# Patient Record
Sex: Female | Born: 1965
Health system: Southern US, Community
[De-identification: ages and names within clinical notes are randomized; demographics above are authoritative.]

## PROBLEM LIST (undated history)

## (undated) ENCOUNTER — Emergency Department (HOSPITAL_COMMUNITY): Admission: EM | Payer: Self-pay | Source: Home / Self Care

## (undated) ENCOUNTER — Emergency Department (HOSPITAL_COMMUNITY): Payer: Self-pay | Source: Home / Self Care

## (undated) DIAGNOSIS — R911 Solitary pulmonary nodule: Secondary | ICD-10-CM

## (undated) DIAGNOSIS — I2510 Atherosclerotic heart disease of native coronary artery without angina pectoris: Secondary | ICD-10-CM

## (undated) DIAGNOSIS — Z765 Malingerer [conscious simulation]: Secondary | ICD-10-CM

## (undated) DIAGNOSIS — F32A Depression, unspecified: Secondary | ICD-10-CM

## (undated) DIAGNOSIS — R109 Unspecified abdominal pain: Secondary | ICD-10-CM

## (undated) DIAGNOSIS — R112 Nausea with vomiting, unspecified: Secondary | ICD-10-CM

## (undated) DIAGNOSIS — I639 Cerebral infarction, unspecified: Secondary | ICD-10-CM

## (undated) DIAGNOSIS — K635 Polyp of colon: Secondary | ICD-10-CM

## (undated) DIAGNOSIS — F419 Anxiety disorder, unspecified: Secondary | ICD-10-CM

## (undated) DIAGNOSIS — G43909 Migraine, unspecified, not intractable, without status migrainosus: Secondary | ICD-10-CM

## (undated) DIAGNOSIS — K5790 Diverticulosis of intestine, part unspecified, without perforation or abscess without bleeding: Secondary | ICD-10-CM

## (undated) DIAGNOSIS — K859 Acute pancreatitis without necrosis or infection, unspecified: Secondary | ICD-10-CM

## (undated) DIAGNOSIS — L930 Discoid lupus erythematosus: Secondary | ICD-10-CM

## (undated) DIAGNOSIS — K649 Unspecified hemorrhoids: Secondary | ICD-10-CM

## (undated) DIAGNOSIS — I499 Cardiac arrhythmia, unspecified: Secondary | ICD-10-CM

## (undated) DIAGNOSIS — E278 Other specified disorders of adrenal gland: Secondary | ICD-10-CM

## (undated) DIAGNOSIS — K2970 Gastritis, unspecified, without bleeding: Secondary | ICD-10-CM

## (undated) DIAGNOSIS — L089 Local infection of the skin and subcutaneous tissue, unspecified: Secondary | ICD-10-CM

## (undated) DIAGNOSIS — K449 Diaphragmatic hernia without obstruction or gangrene: Secondary | ICD-10-CM

## (undated) DIAGNOSIS — M329 Systemic lupus erythematosus, unspecified: Secondary | ICD-10-CM

## (undated) DIAGNOSIS — I10 Essential (primary) hypertension: Secondary | ICD-10-CM

## (undated) DIAGNOSIS — Z5189 Encounter for other specified aftercare: Secondary | ICD-10-CM

## (undated) DIAGNOSIS — G8929 Other chronic pain: Secondary | ICD-10-CM

## (undated) DIAGNOSIS — K222 Esophageal obstruction: Secondary | ICD-10-CM

## (undated) DIAGNOSIS — L0291 Cutaneous abscess, unspecified: Secondary | ICD-10-CM

## (undated) DIAGNOSIS — D573 Sickle-cell trait: Secondary | ICD-10-CM

## (undated) DIAGNOSIS — K3184 Gastroparesis: Secondary | ICD-10-CM

## (undated) DIAGNOSIS — Z980 Intestinal bypass and anastomosis status: Secondary | ICD-10-CM

## (undated) DIAGNOSIS — IMO0002 Reserved for concepts with insufficient information to code with codable children: Secondary | ICD-10-CM

## (undated) DIAGNOSIS — F1010 Alcohol abuse, uncomplicated: Secondary | ICD-10-CM

## (undated) DIAGNOSIS — E079 Disorder of thyroid, unspecified: Secondary | ICD-10-CM

## (undated) DIAGNOSIS — S31109A Unspecified open wound of abdominal wall, unspecified quadrant without penetration into peritoneal cavity, initial encounter: Secondary | ICD-10-CM

## (undated) DIAGNOSIS — K219 Gastro-esophageal reflux disease without esophagitis: Secondary | ICD-10-CM

## (undated) DIAGNOSIS — T1491XA Suicide attempt, initial encounter: Secondary | ICD-10-CM

## (undated) DIAGNOSIS — D649 Anemia, unspecified: Secondary | ICD-10-CM

## (undated) DIAGNOSIS — F329 Major depressive disorder, single episode, unspecified: Secondary | ICD-10-CM

## (undated) HISTORY — DX: Cardiac arrhythmia, unspecified: I49.9

## (undated) HISTORY — DX: Gastroparesis: K31.84

## (undated) HISTORY — PX: ABDOMINAL SURGERY: SHX537

## (undated) HISTORY — PX: OTHER SURGICAL HISTORY: SHX169

## (undated) HISTORY — DX: Discoid lupus erythematosus: L93.0

## (undated) HISTORY — DX: Encounter for other specified aftercare: Z51.89

## (undated) HISTORY — PX: ABDOMINAL HYSTERECTOMY: SHX81

## (undated) HISTORY — DX: Migraine, unspecified, not intractable, without status migrainosus: G43.909

## (undated) HISTORY — PX: COLONOSCOPY: SHX174

## (undated) HISTORY — PX: HERNIA REPAIR: SHX51

## (undated) HISTORY — DX: Anemia, unspecified: D64.9

## (undated) HISTORY — PX: ADRENALECTOMY: SHX876

## (undated) HISTORY — PX: CHOLECYSTECTOMY: SHX55

---

## 1998-03-18 DIAGNOSIS — E079 Disorder of thyroid, unspecified: Secondary | ICD-10-CM

## 1998-03-18 HISTORY — DX: Disorder of thyroid, unspecified: E07.9

## 2001-11-23 ENCOUNTER — Emergency Department (HOSPITAL_COMMUNITY): Admission: EM | Admit: 2001-11-23 | Discharge: 2001-11-23 | Payer: Self-pay | Admitting: Emergency Medicine

## 2001-12-23 ENCOUNTER — Encounter: Payer: Self-pay | Admitting: Emergency Medicine

## 2001-12-23 ENCOUNTER — Emergency Department (HOSPITAL_COMMUNITY): Admission: EM | Admit: 2001-12-23 | Discharge: 2001-12-23 | Payer: Self-pay | Admitting: Emergency Medicine

## 2004-04-15 ENCOUNTER — Emergency Department: Payer: Self-pay | Admitting: Emergency Medicine

## 2004-07-27 ENCOUNTER — Emergency Department: Payer: Self-pay | Admitting: Emergency Medicine

## 2004-09-13 ENCOUNTER — Emergency Department: Payer: Self-pay | Admitting: Emergency Medicine

## 2004-12-12 ENCOUNTER — Emergency Department: Payer: Self-pay | Admitting: Unknown Physician Specialty

## 2005-04-01 ENCOUNTER — Emergency Department: Payer: Self-pay | Admitting: Emergency Medicine

## 2005-10-15 ENCOUNTER — Emergency Department: Payer: Self-pay | Admitting: Internal Medicine

## 2006-01-08 ENCOUNTER — Emergency Department: Payer: Self-pay | Admitting: Emergency Medicine

## 2006-01-30 ENCOUNTER — Emergency Department: Payer: Self-pay | Admitting: Emergency Medicine

## 2006-02-16 ENCOUNTER — Emergency Department: Payer: Self-pay | Admitting: Unknown Physician Specialty

## 2006-04-12 ENCOUNTER — Emergency Department: Payer: Self-pay | Admitting: Emergency Medicine

## 2006-06-22 ENCOUNTER — Inpatient Hospital Stay: Payer: Self-pay | Admitting: Internal Medicine

## 2006-06-22 ENCOUNTER — Other Ambulatory Visit: Payer: Self-pay

## 2007-06-07 IMAGING — CR DG TIBIA/FIBULA 2V*L*
1 series · 2 of 2 positions shown · non-contrast
Comparison: none

REASON FOR EXAM: injury - mc2
COMMENTS:  LMP: Two weeks ago

PROCEDURE:     DXR - DXR TIBIA AND FIBULA LT (LOWER L  - January 30, 2006  [DATE]
RESULT:     AP and lateral views of the LEFT lower leg show no fracture,
dislocation or other acute bony abnormality.

[Series 1: view not recorded · 0.17mm/px · 2 of 2 slices shown]
[im 1/2]
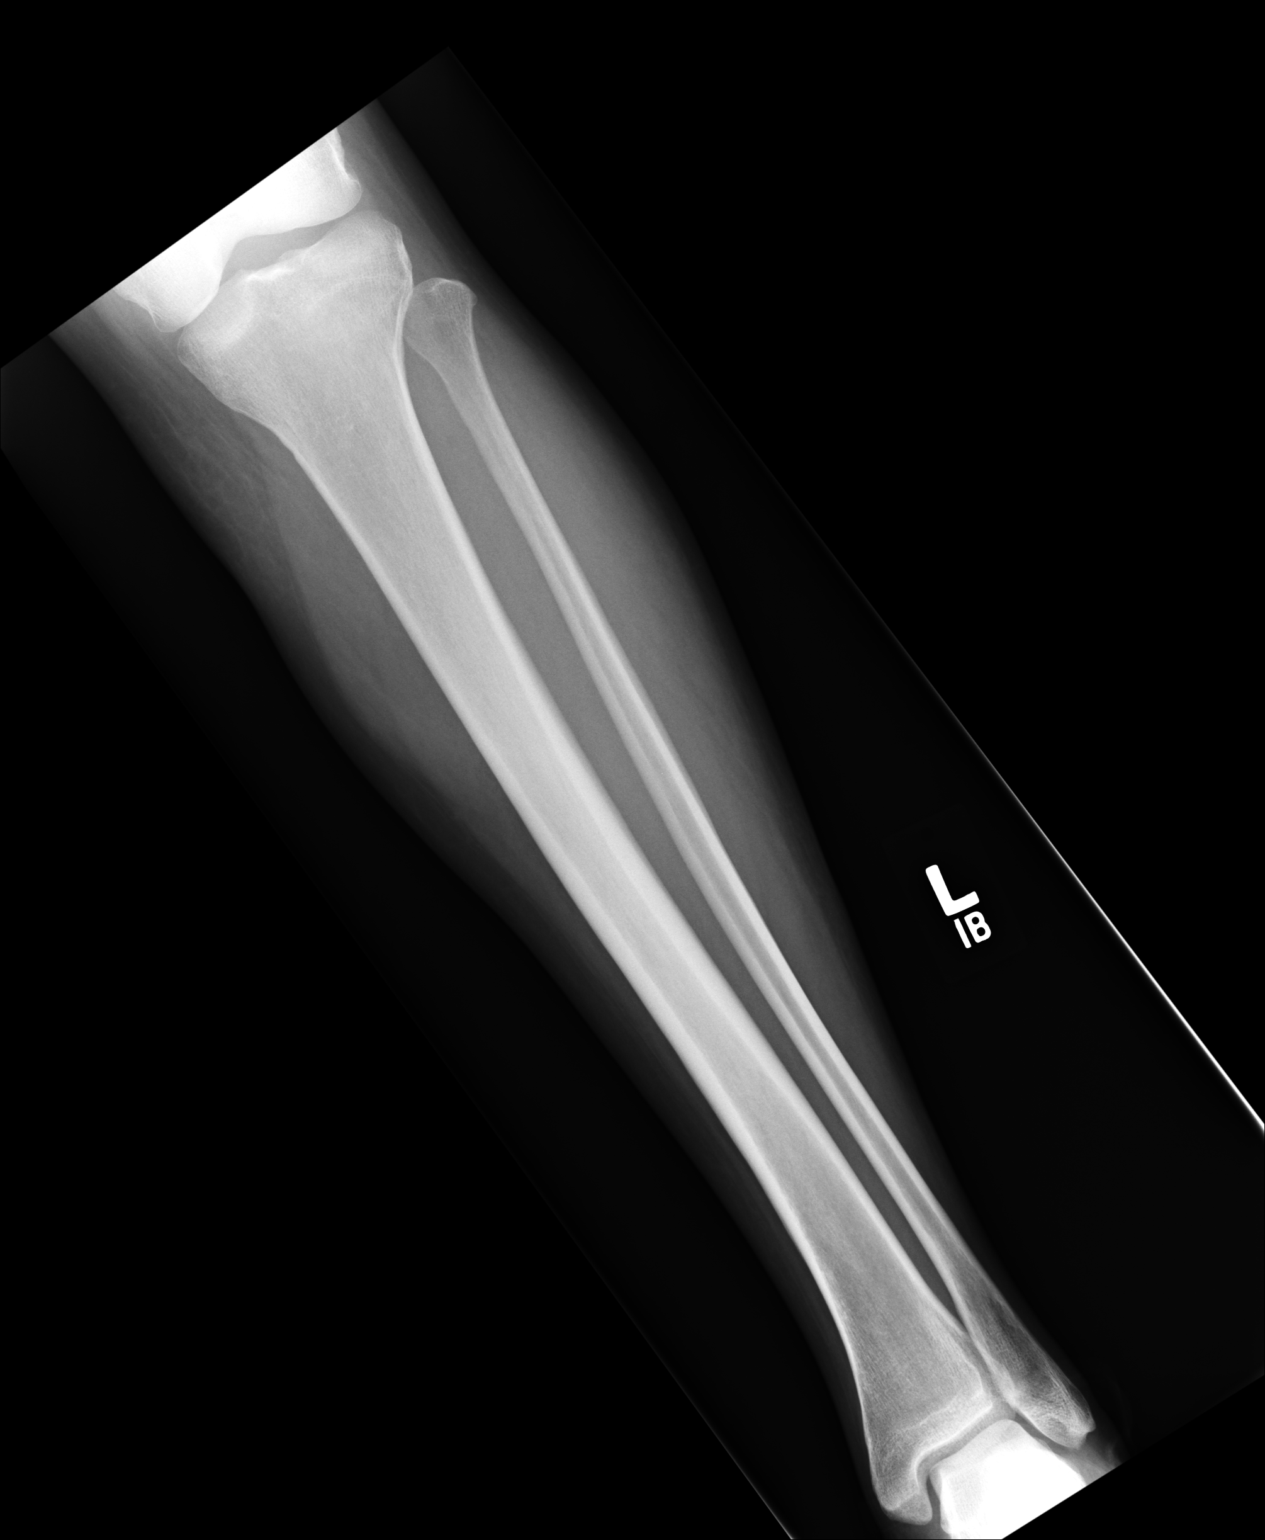
[im 2/2]
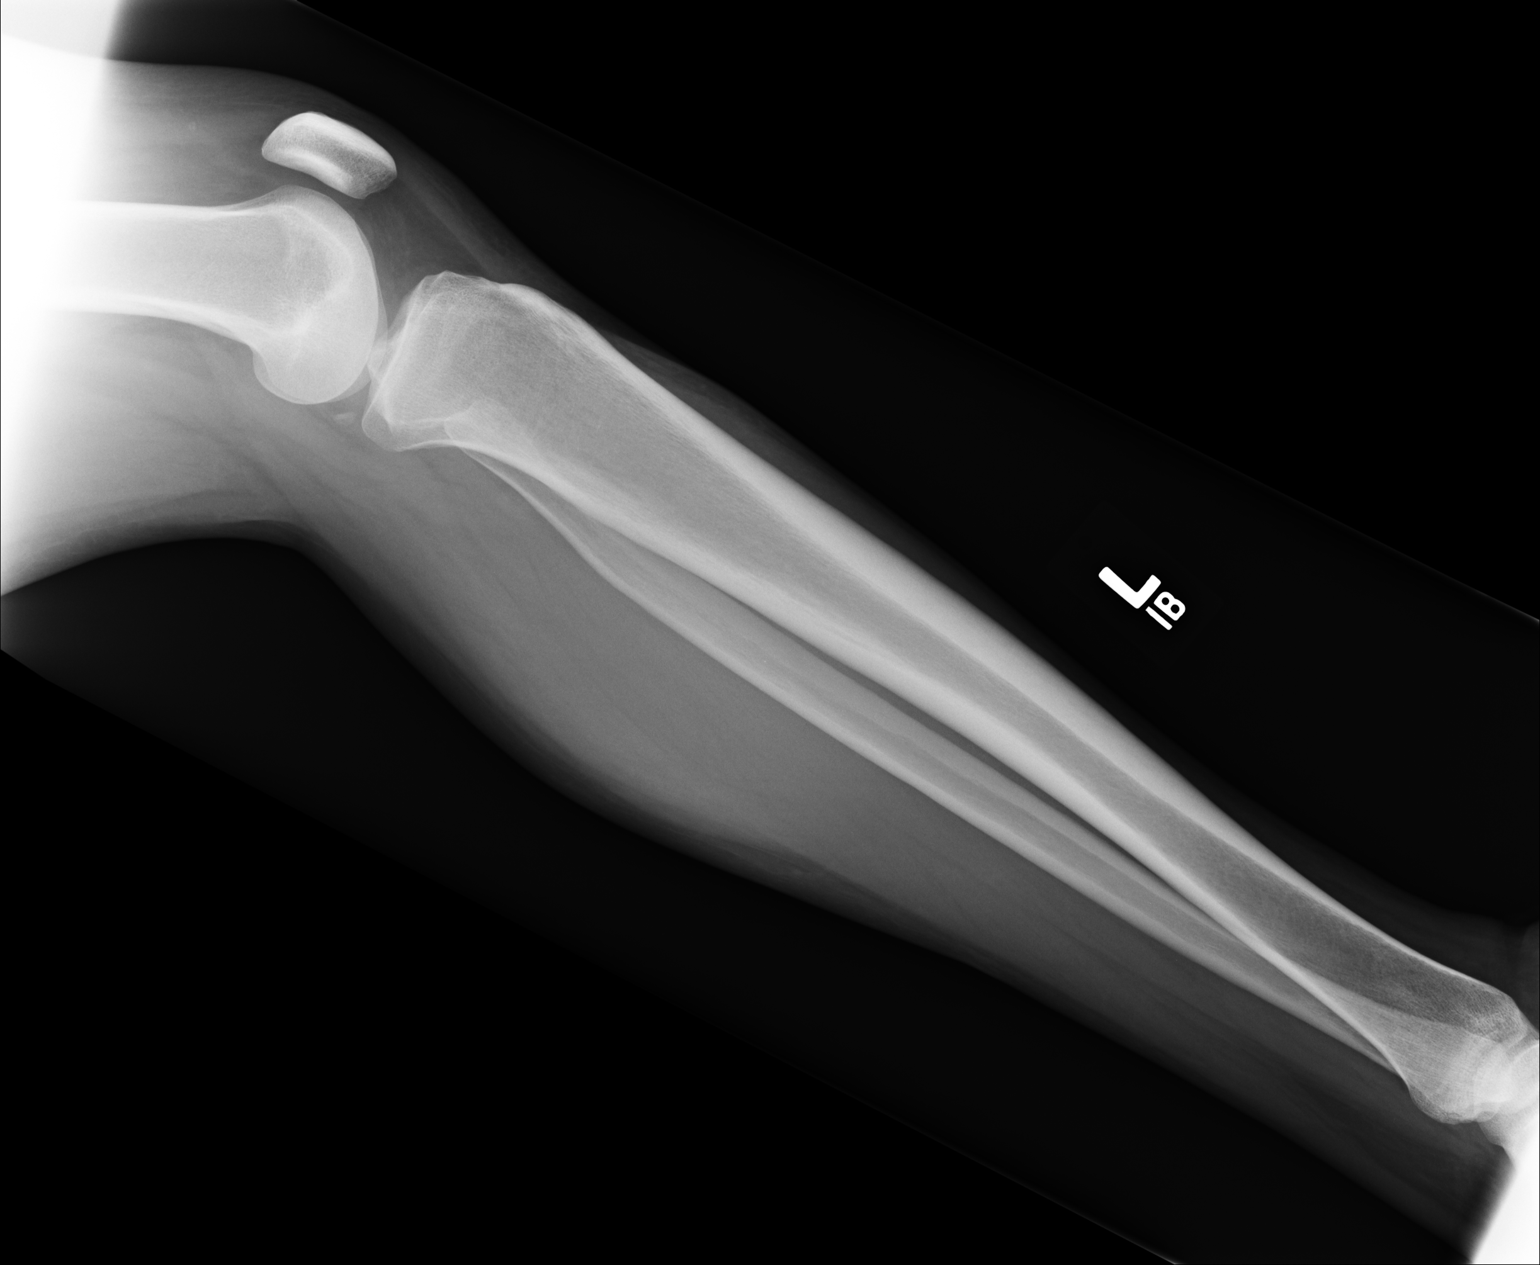

[2 of 2 positions shown; findings below may reference images not displayed]

IMPRESSION: 1)No significant osseous abnormalities are noted.

## 2007-06-24 IMAGING — CR DG ABDOMEN 3V
1 series · 4 of 4 positions shown · non-contrast
Comparison: none

REASON FOR EXAM: Pain
COMMENTS:

[Series 1: view not recorded · 0.17mm/px · 4 of 4 slices shown]
[im 1/4]
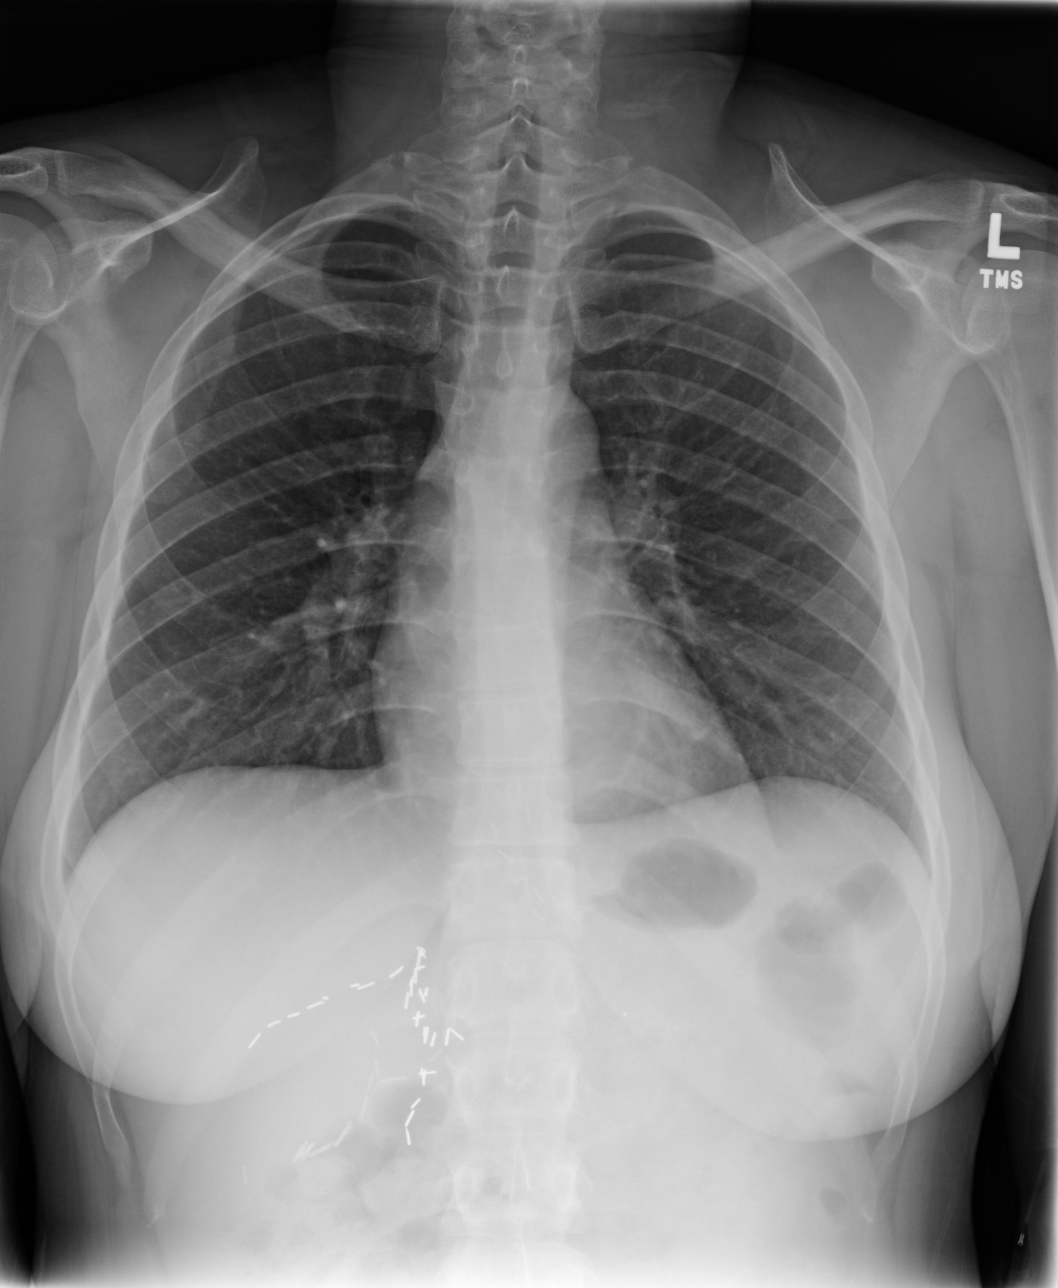
[im 2/4]
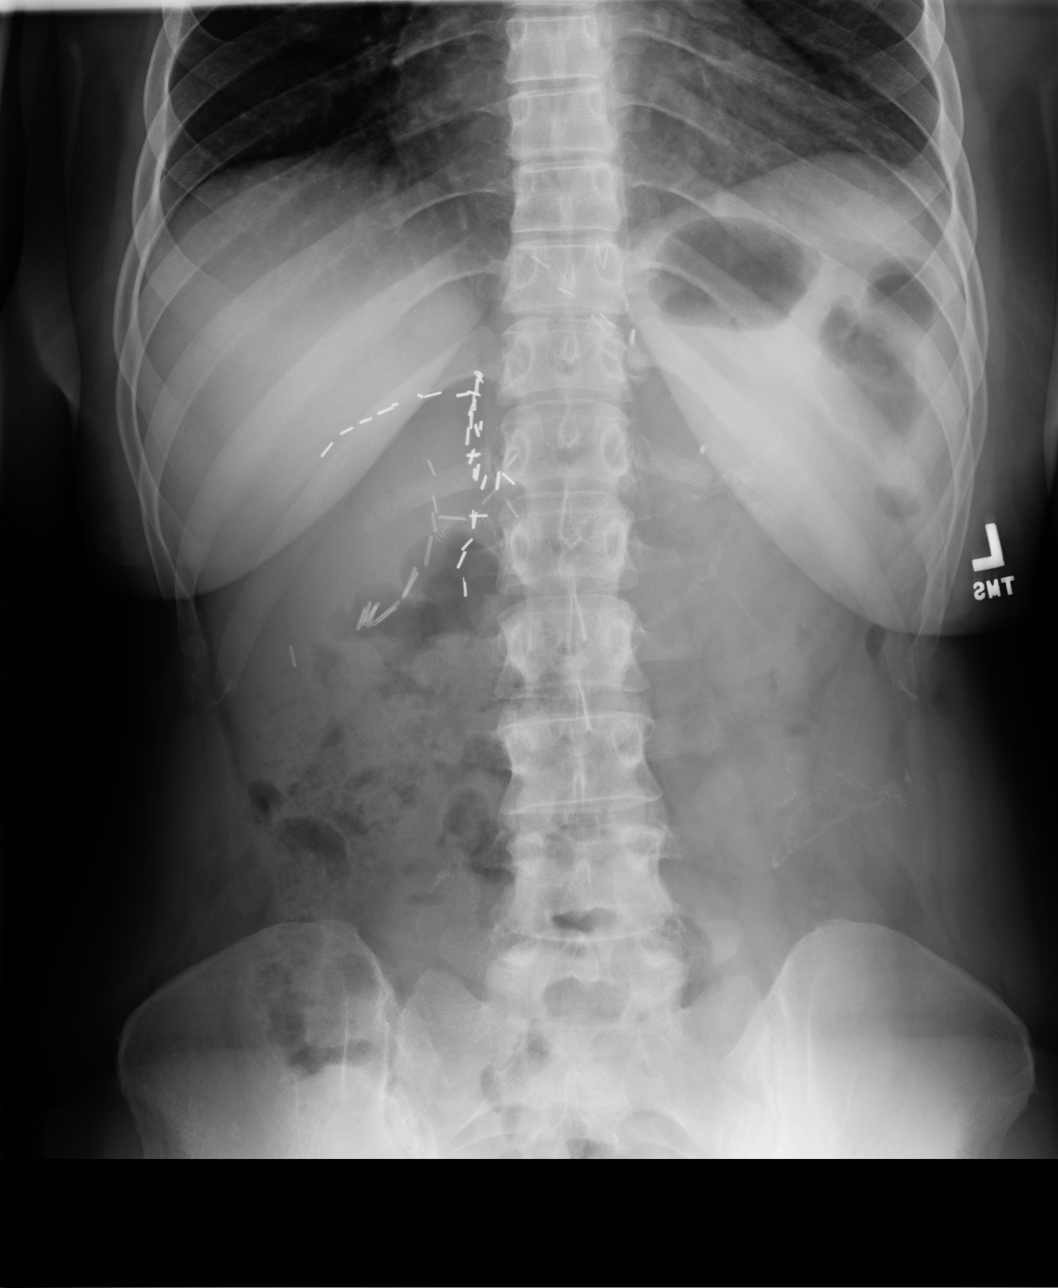
[im 3/4]
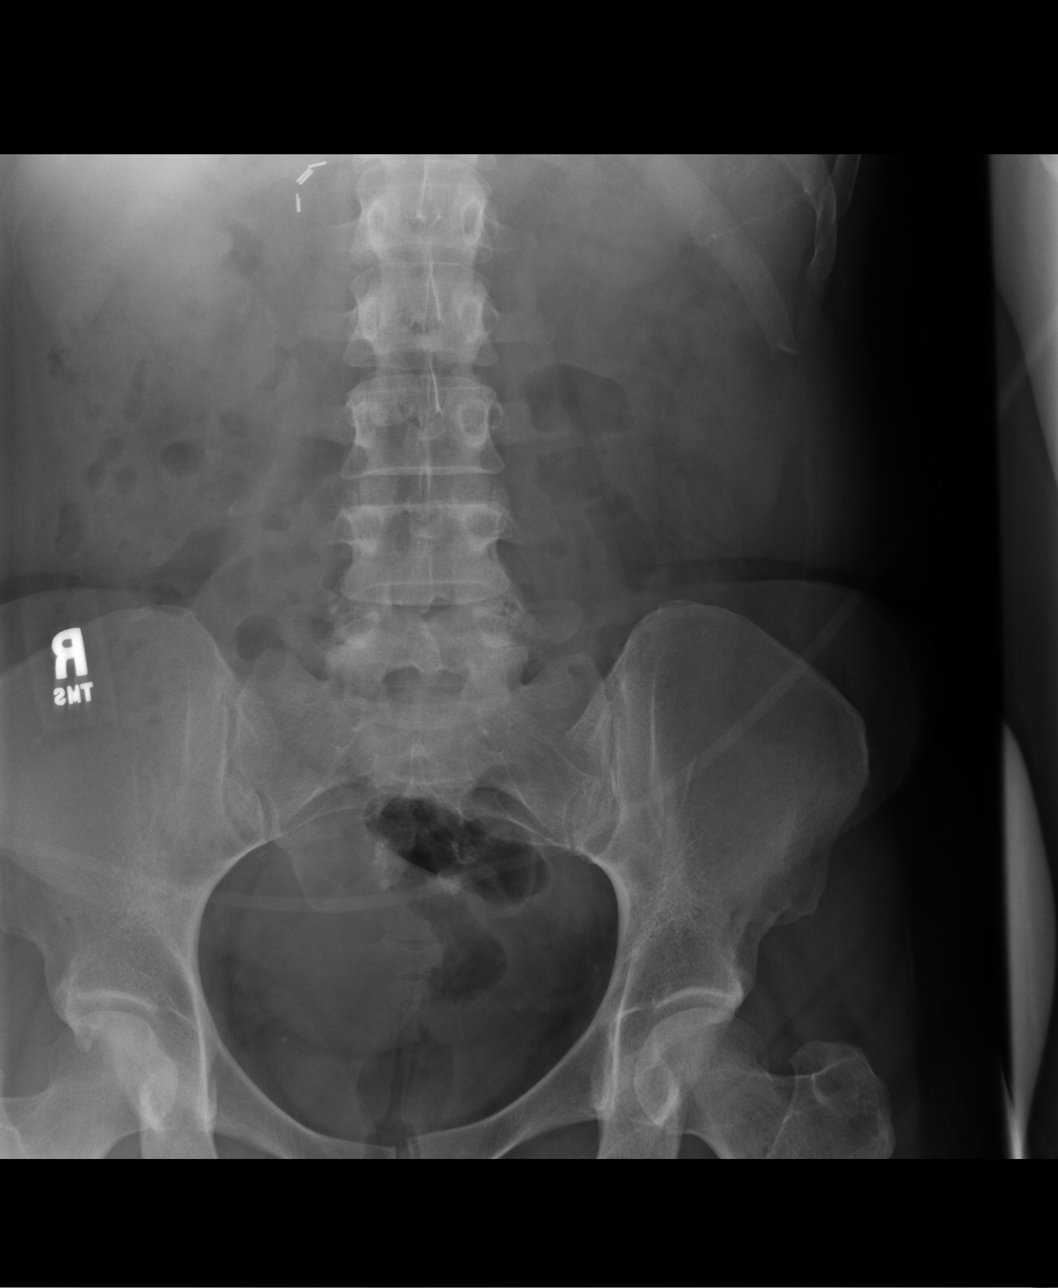
[im 4/4]
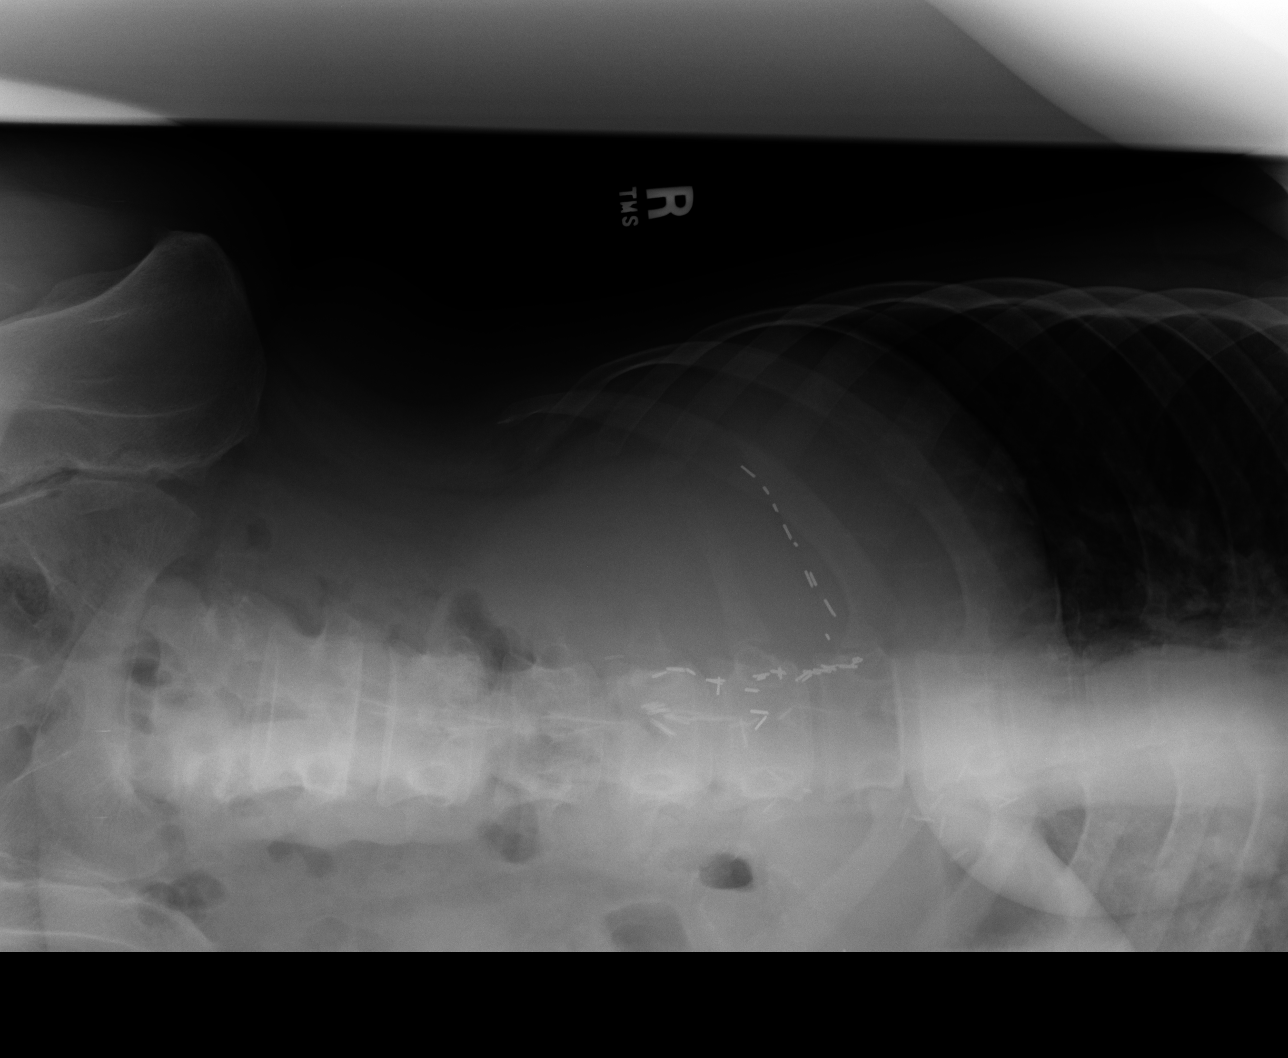

[4 of 4 positions shown; findings below may reference images not displayed]

PROCEDURE:     DXR - DXR ABDOMEN 3-WAY (INCL PA CXR)  - February 17, 2006 [DATE]

RESULT:     PA view of the chest shows the lung fields to be clear.

Three views of the abdomen were obtained.  No subdiaphragmatic free air is
seen.  The bowel gas pattern shows no specific abnormalities.  No abnormal
intraabdominal calcifications are seen.  Postoperative metallic clips are
noted in the LEFT upper quadrant and at the region of the gastroesophageal
junction.  No acute bony abnormalities are seen.
IMPRESSION: No acute changes are identified.

## 2007-10-28 IMAGING — CR DG CHEST 1V PORT
1 series · 1 of 1 positions shown · non-contrast
Comparison: none

REASON FOR EXAM: Shortness of breath, unresponsive  [HOSPITAL]
COMMENTS:

PROCEDURE:     DXR - DXR PORTABLE CHEST SINGLE VIEW  - June 22, 2006  [DATE]
RESULT:     The heart is borderline enlarged. Cardiac monitoring electrodes
are present. There is mild pulmonary vascular congestion with shallow
inspiratory effort.

[view not recorded]
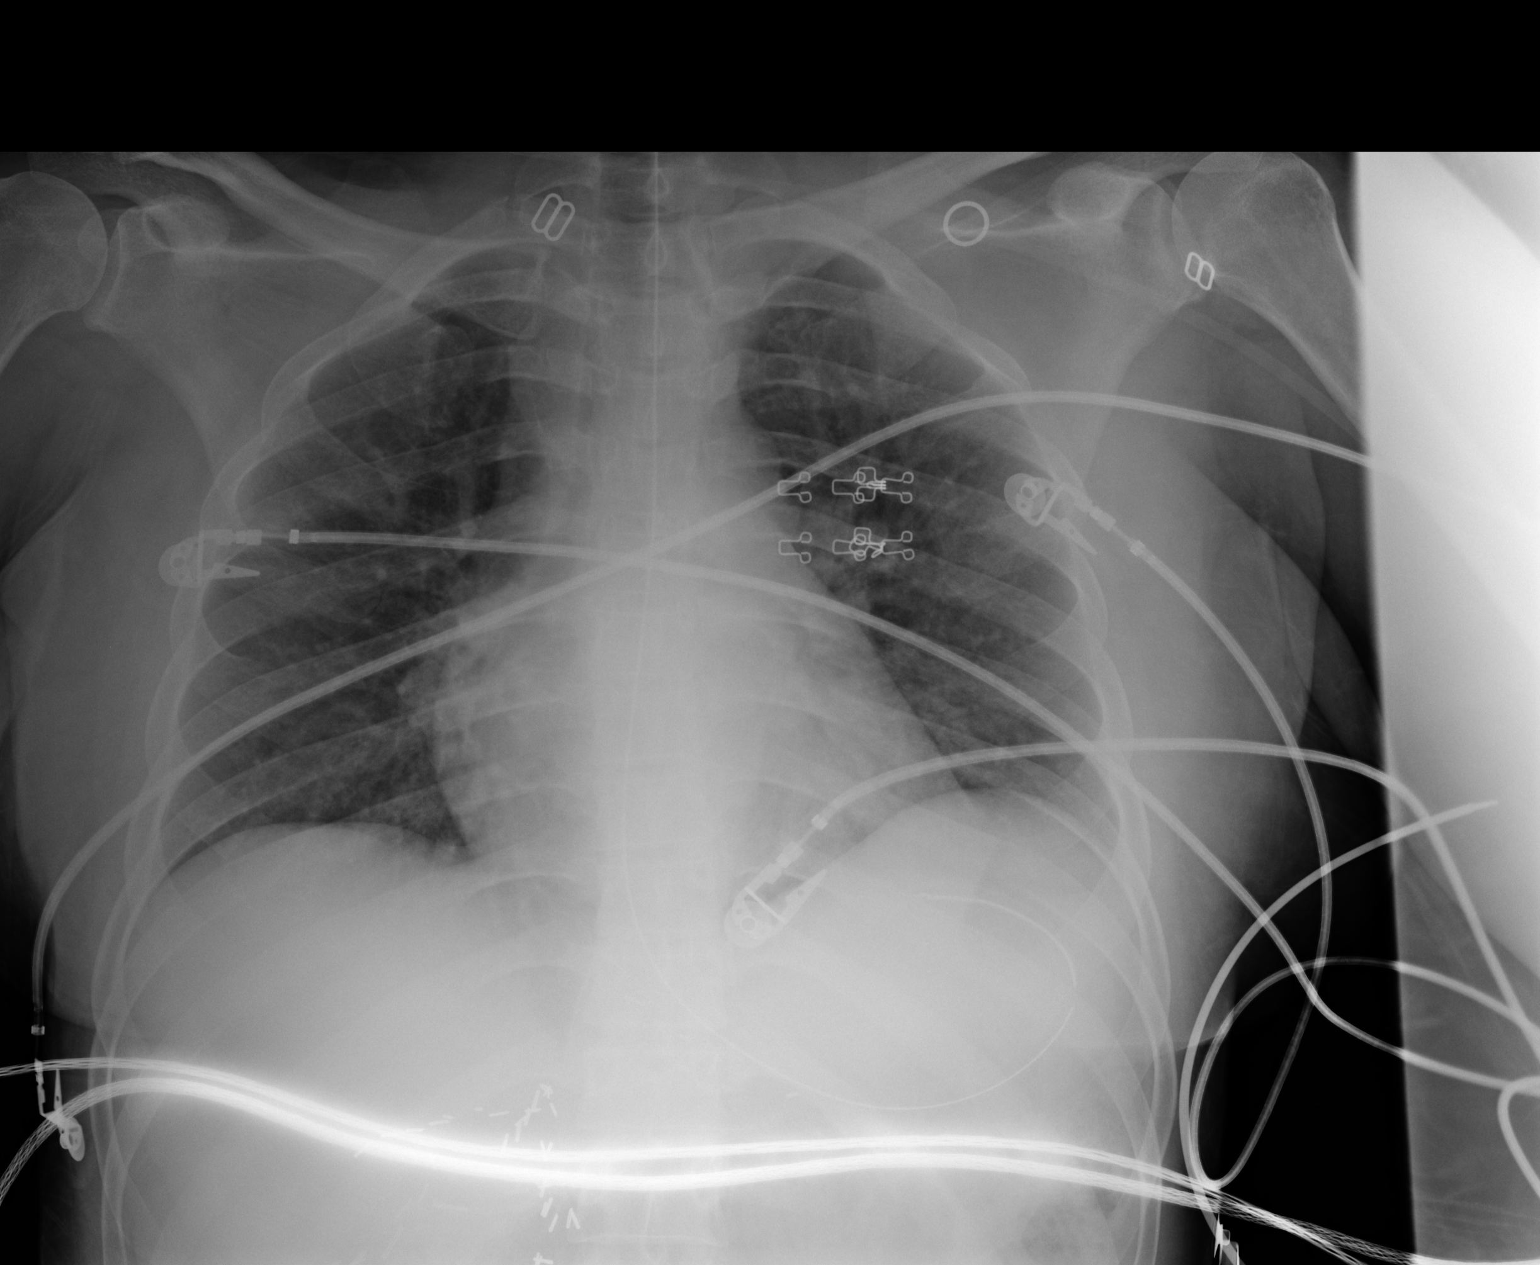

[1 of 1 positions shown; findings below may reference images not displayed]

IMPRESSION: Borderline cardiomegaly with pulmonary vascular congestion.

## 2007-10-28 IMAGING — CT CT HEAD WITHOUT CONTRAST
2 series · 16 of 30 positions shown, 20 images · non-contrast
Comparison: none

REASON FOR EXAM: unresponsiuves
COMMENTS:

[Series 2: without · axial · non-contrast · 0.44mm/px · z∈[-80,+44]mm · 13 of 31 slices shown, 17 images]
[im 3/31  brain]
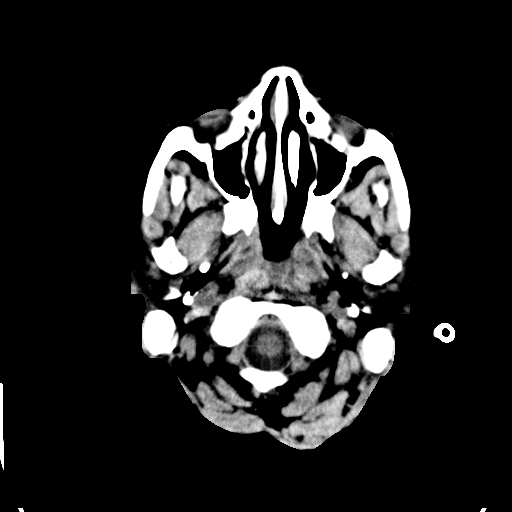
[im 3/31  bone]
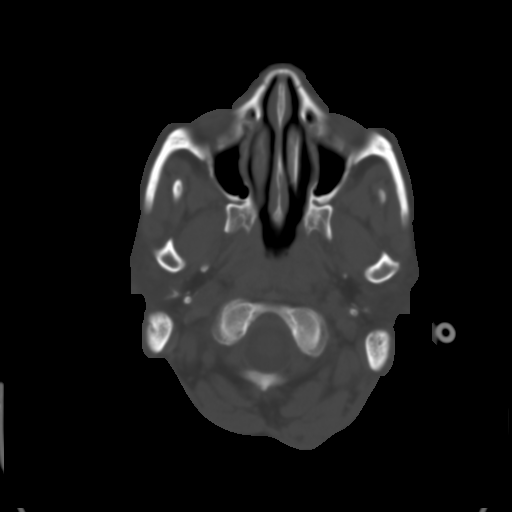
[im 5/31  brain]
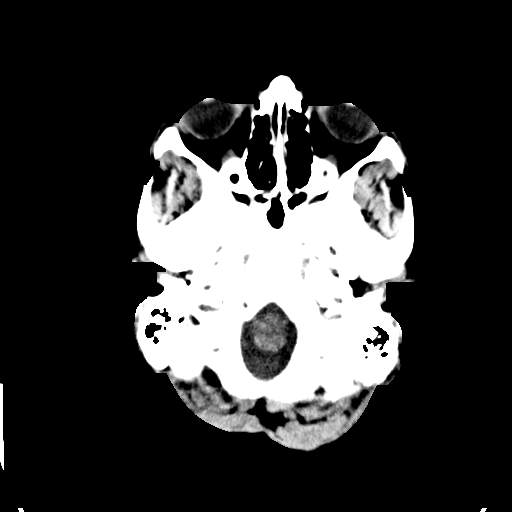
[im 7/31  brain]
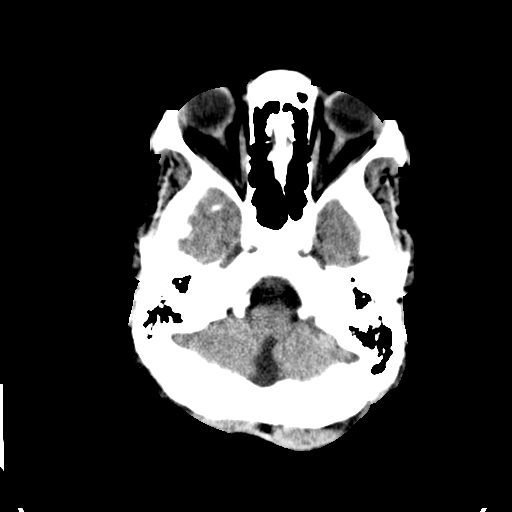
[im 9/31  brain]
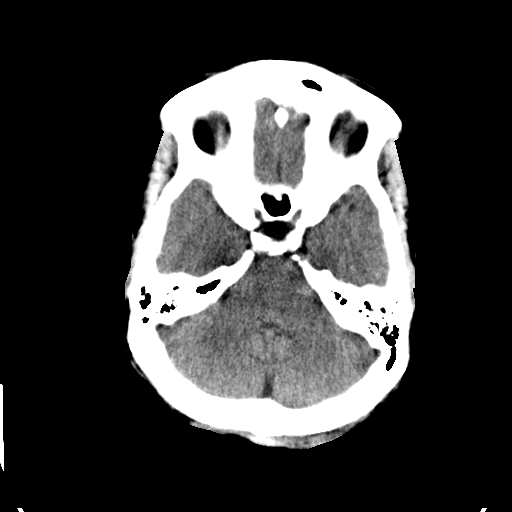
[im 11/31  brain]
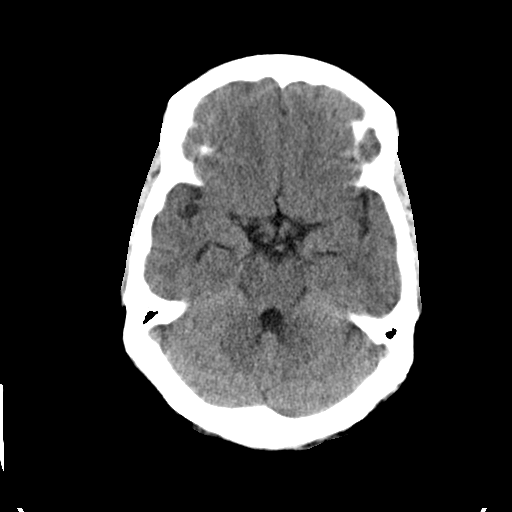
[im 11/31  bone]
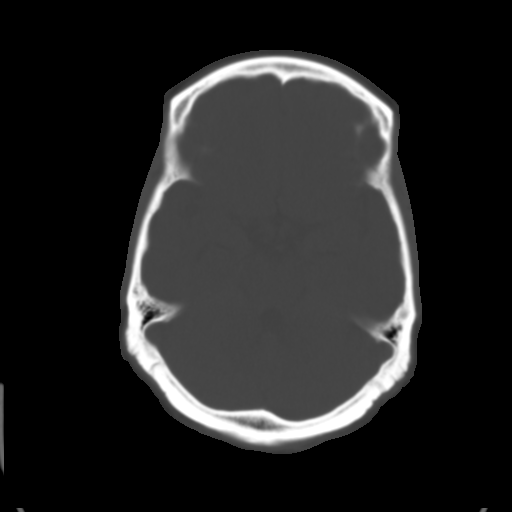
[im 13/31  brain]
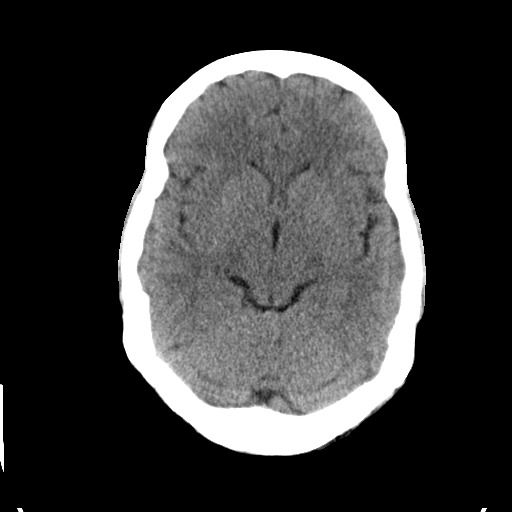
[im 16/31  brain]
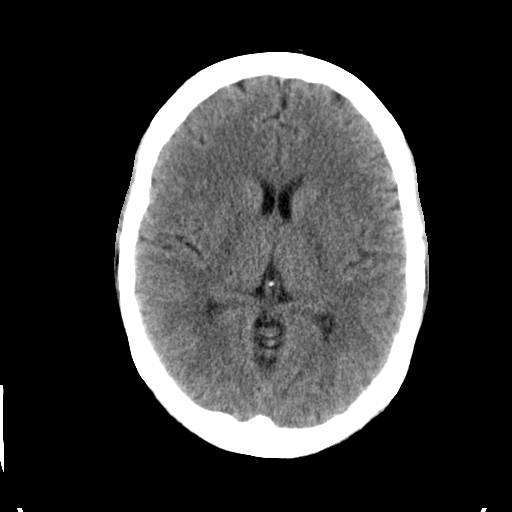
[im 18/31  brain]
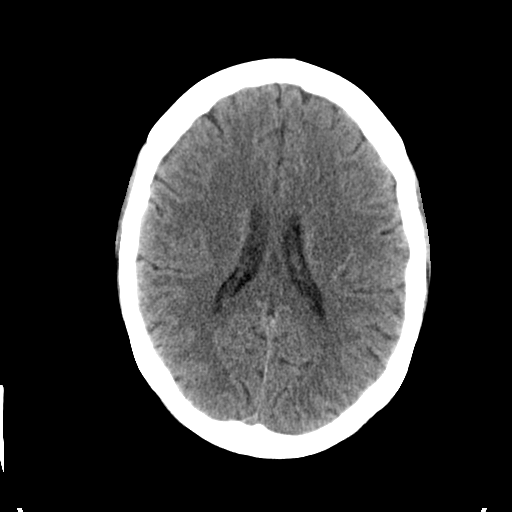
[im 20/31  brain]
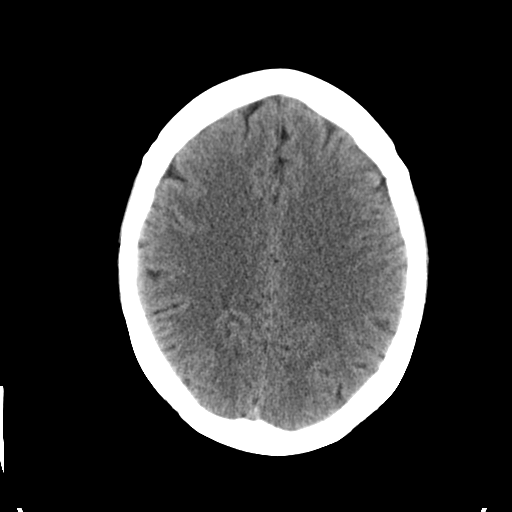
[im 20/31  bone]
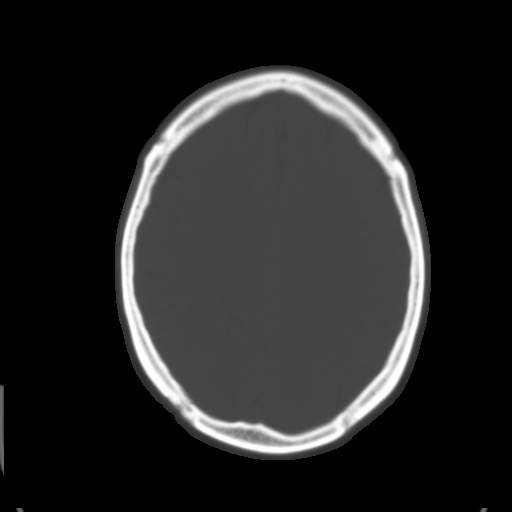
[im 22/31  brain]
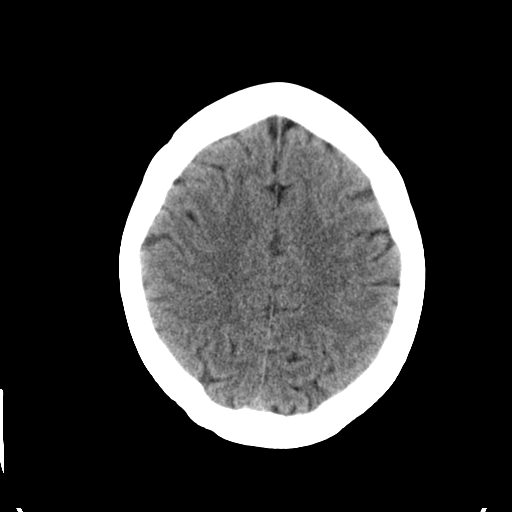
[im 24/31  brain]
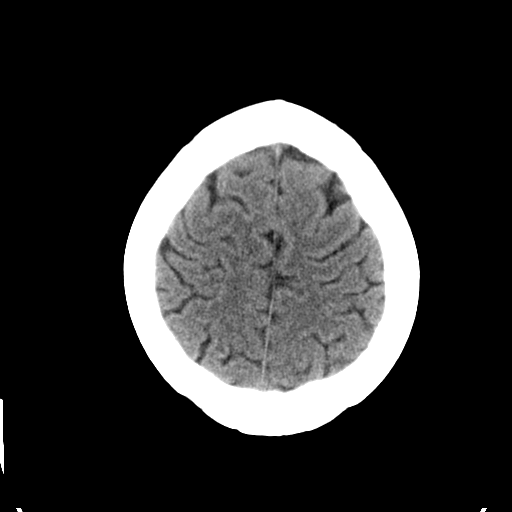
[im 26/31  brain]
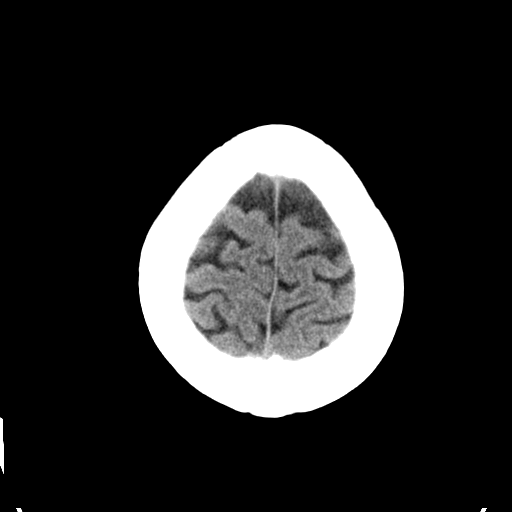
[im 28/31  brain]
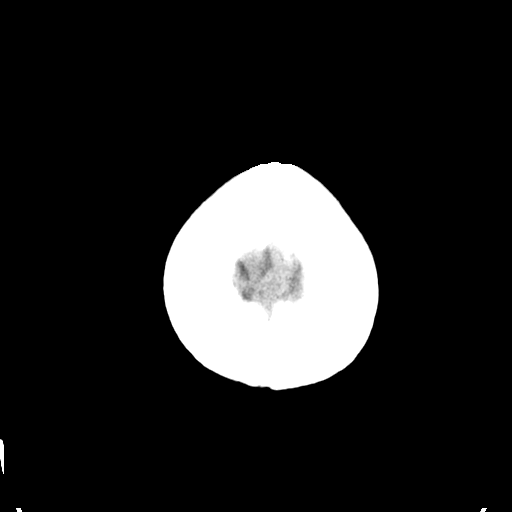
[im 28/31  bone]
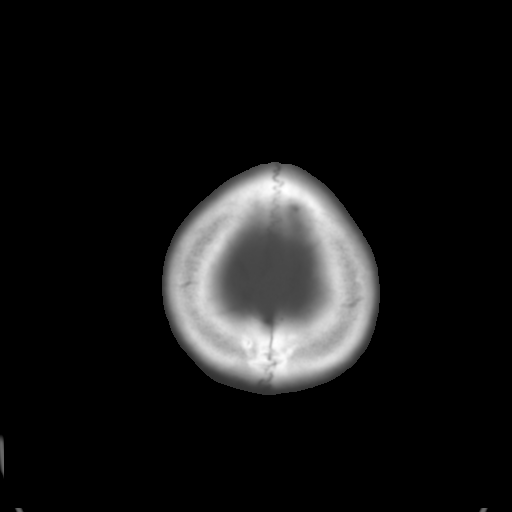

[Series 3: bone · axial · 0.44mm/px · z∈[-80,-40]mm · 3 of 31 slices shown]
[im 3/31  bone]
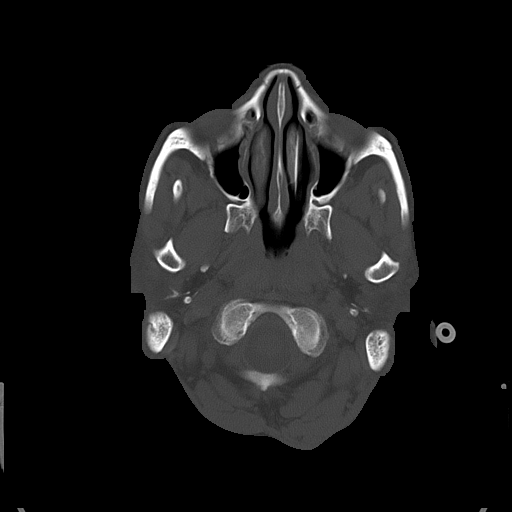
[im 7/31  bone]
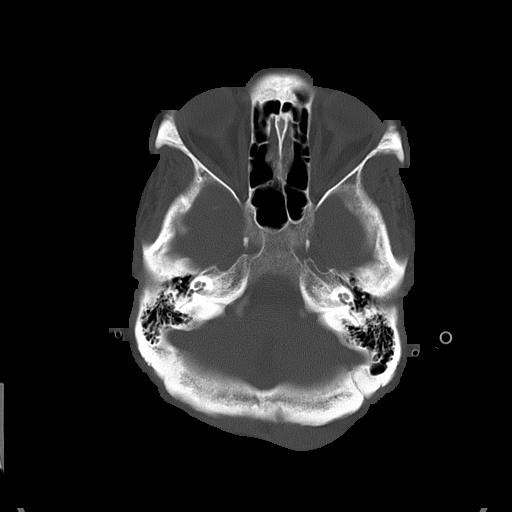
[im 11/31  bone]
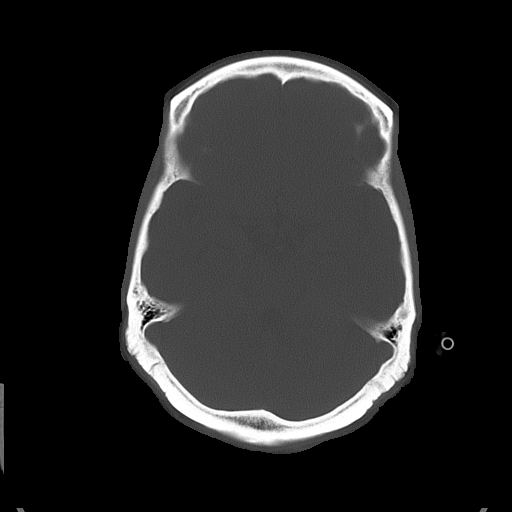

[16 of 30 positions shown; findings below may reference images not displayed]

PROCEDURE:     CT  - CT HEAD WITHOUT CONTRAST  - June 22, 2006  [DATE]

RESULT:     The ventricles and sulci are normal. There is no hemorrhage.
There is no focal mass, mass-effect or midline shift. Is no evidence of
edema or territorial infarct. The bone windows demonstrate normal aeration
of the paranasal sinuses and mastoid air cells. There is no skull fracture
demonstrated.
IMPRESSION: 1. No acute intracranial abnormality.

## 2011-03-19 HISTORY — PX: ABDOMINAL HYSTERECTOMY: SHX81

## 2011-08-03 DIAGNOSIS — K089 Disorder of teeth and supporting structures, unspecified: Secondary | ICD-10-CM | POA: Diagnosis not present

## 2011-08-03 DIAGNOSIS — K029 Dental caries, unspecified: Secondary | ICD-10-CM | POA: Diagnosis not present

## 2011-08-22 DIAGNOSIS — R0789 Other chest pain: Secondary | ICD-10-CM | POA: Diagnosis not present

## 2011-08-22 DIAGNOSIS — M329 Systemic lupus erythematosus, unspecified: Secondary | ICD-10-CM | POA: Diagnosis not present

## 2011-08-22 DIAGNOSIS — F17200 Nicotine dependence, unspecified, uncomplicated: Secondary | ICD-10-CM | POA: Diagnosis not present

## 2011-08-22 DIAGNOSIS — R079 Chest pain, unspecified: Secondary | ICD-10-CM | POA: Diagnosis not present

## 2013-02-06 ENCOUNTER — Encounter (HOSPITAL_COMMUNITY): Payer: Self-pay | Admitting: Emergency Medicine

## 2013-02-06 ENCOUNTER — Inpatient Hospital Stay (HOSPITAL_COMMUNITY)
Admission: EM | Admit: 2013-02-06 | Discharge: 2013-02-09 | DRG: 581 | Disposition: A | Discharge status: Home / Self Care | Payer: Medicare Other | Attending: General Surgery | Admitting: General Surgery

## 2013-02-06 ENCOUNTER — Emergency Department (HOSPITAL_COMMUNITY): Payer: Medicare Other

## 2013-02-06 DIAGNOSIS — L980 Pyogenic granuloma: Secondary | ICD-10-CM | POA: Diagnosis not present

## 2013-02-06 DIAGNOSIS — R112 Nausea with vomiting, unspecified: Secondary | ICD-10-CM | POA: Diagnosis not present

## 2013-02-06 DIAGNOSIS — Z23 Encounter for immunization: Secondary | ICD-10-CM

## 2013-02-06 DIAGNOSIS — F17200 Nicotine dependence, unspecified, uncomplicated: Secondary | ICD-10-CM | POA: Diagnosis present

## 2013-02-06 DIAGNOSIS — M329 Systemic lupus erythematosus, unspecified: Secondary | ICD-10-CM | POA: Diagnosis not present

## 2013-02-06 DIAGNOSIS — L02219 Cutaneous abscess of trunk, unspecified: Secondary | ICD-10-CM | POA: Diagnosis not present

## 2013-02-06 DIAGNOSIS — R109 Unspecified abdominal pain: Secondary | ICD-10-CM

## 2013-02-06 DIAGNOSIS — R1032 Left lower quadrant pain: Secondary | ICD-10-CM | POA: Diagnosis not present

## 2013-02-06 HISTORY — DX: Reserved for concepts with insufficient information to code with codable children: IMO0002

## 2013-02-06 HISTORY — DX: Disorder of thyroid, unspecified: E07.9

## 2013-02-06 HISTORY — DX: Systemic lupus erythematosus, unspecified: M32.9

## 2013-02-06 LAB — COMPREHENSIVE METABOLIC PANEL
ALT: 38 U/L — ABNORMAL HIGH (ref 0–35)
Alkaline Phosphatase: 89 U/L (ref 39–117)
CO2: 25 mEq/L (ref 19–32)
Calcium: 9.2 mg/dL (ref 8.4–10.5)
Chloride: 100 mEq/L (ref 96–112)
GFR calc Af Amer: >90 mL/min (ref >90)
GFR calc non Af Amer: >90 mL/min (ref >90)
Glucose, Bld: 84 mg/dL (ref 70–99)
Sodium: 137 mEq/L (ref 135–145)
Total Bilirubin: 0.4 mg/dL (ref 0.3–1.2)

## 2013-02-06 LAB — CBC WITH DIFFERENTIAL/PLATELET
Basophils Absolute: 0 10*3/uL (ref 0.0–0.1)
Eosinophils Relative: 1 % (ref 0–5)
HCT: 36.4 % (ref 36.0–46.0)
Hemoglobin: 12.6 g/dL (ref 12.0–15.0)
Lymphocytes Relative: 22 % (ref 12–46)
Lymphs Abs: 1.4 10*3/uL (ref 0.7–4.0)
MCH: 30.8 pg (ref 26.0–34.0)
MCV: 89 fL (ref 78.0–100.0)
Monocytes Relative: 9 % (ref 3–12)
Platelets: 229 10*3/uL (ref 150–400)
RBC: 4.09 MIL/uL (ref 3.87–5.11)
WBC: 6.4 10*3/uL (ref 4.0–10.5)

## 2013-02-06 MED ORDER — ACETAMINOPHEN 650 MG RE SUPP: 650.0000 mg | Freq: Four times a day (QID) | RECTAL | Status: DC | PRN

## 2013-02-06 MED ORDER — HYDROMORPHONE HCL PF 1 MG/ML IJ SOLN
1.0000 mg | INTRAMUSCULAR | Status: DC | PRN
  Administered 2013-02-06 – 2013-02-09 (×14): 1 mg via INTRAVENOUS
  Filled 2013-02-06 (×13): qty 1

## 2013-02-06 MED ORDER — IOHEXOL 300 MG/ML  SOLN
50.0000 mL | Freq: Once | INTRAMUSCULAR | Status: AC | PRN
  Administered 2013-02-06: 50 mL via ORAL

## 2013-02-06 MED ORDER — ONDANSETRON HCL 4 MG/2ML IJ SOLN
4.0000 mg | Freq: Once | INTRAMUSCULAR | Status: AC
  Administered 2013-02-06: 4 mg via INTRAVENOUS
  Filled 2013-02-06: qty 2

## 2013-02-06 MED ORDER — DIPHENHYDRAMINE HCL 50 MG/ML IJ SOLN
12.5000 mg | Freq: Four times a day (QID) | INTRAMUSCULAR | Status: DC | PRN
  Administered 2013-02-06: 12.5 mg via INTRAVENOUS
  Filled 2013-02-06: qty 1

## 2013-02-06 MED ORDER — ENOXAPARIN SODIUM 40 MG/0.4ML ~~LOC~~ SOLN
40.0000 mg | Freq: Every day | SUBCUTANEOUS | Status: DC
  Administered 2013-02-06 – 2013-02-09 (×4): 40 mg via SUBCUTANEOUS
  Filled 2013-02-06 (×4): qty 0.4

## 2013-02-06 MED ORDER — INFLUENZA VAC SPLIT QUAD 0.5 ML IM SUSP
0.5000 mL | INTRAMUSCULAR | Status: AC
  Administered 2013-02-08: 0.5 mL via INTRAMUSCULAR
  Filled 2013-02-06: qty 0.5

## 2013-02-06 MED ORDER — SODIUM CHLORIDE 0.9 % IV SOLN
Freq: Once | INTRAVENOUS | Status: AC
  Administered 2013-02-06: 09:00:00 via INTRAVENOUS

## 2013-02-06 MED ORDER — MUPIROCIN 2 % EX OINT
TOPICAL_OINTMENT | Freq: Two times a day (BID) | CUTANEOUS | Status: DC
  Administered 2013-02-06 – 2013-02-07 (×2): 1 via NASAL
  Administered 2013-02-07 – 2013-02-09 (×4): via NASAL
  Filled 2013-02-06: qty 22

## 2013-02-06 MED ORDER — KETOROLAC TROMETHAMINE 30 MG/ML IJ SOLN
30.0000 mg | Freq: Once | INTRAMUSCULAR | Status: AC
  Administered 2013-02-06: 30 mg via INTRAVENOUS
  Filled 2013-02-06: qty 1

## 2013-02-06 MED ORDER — PIPERACILLIN-TAZOBACTAM 3.375 G IVPB
3.3750 g | Freq: Three times a day (TID) | INTRAVENOUS | Status: DC
  Administered 2013-02-06 – 2013-02-09 (×9): 3.375 g via INTRAVENOUS
  Filled 2013-02-06 (×10): qty 50

## 2013-02-06 MED ORDER — HYDROMORPHONE HCL PF 1 MG/ML IJ SOLN
INTRAMUSCULAR | Status: AC
  Filled 2013-02-06: qty 1

## 2013-02-06 MED ORDER — ACETAMINOPHEN 325 MG PO TABS: 650.0000 mg | ORAL_TABLET | Freq: Four times a day (QID) | ORAL | Status: DC | PRN

## 2013-02-06 MED ORDER — LISINOPRIL 10 MG PO TABS
10.0000 mg | ORAL_TABLET | Freq: Every day | ORAL | Status: DC
  Administered 2013-02-06 – 2013-02-09 (×4): 10 mg via ORAL
  Filled 2013-02-06 (×4): qty 1

## 2013-02-06 MED ORDER — ONDANSETRON HCL 4 MG/2ML IJ SOLN
4.0000 mg | Freq: Four times a day (QID) | INTRAMUSCULAR | Status: DC | PRN
  Administered 2013-02-06: 4 mg via INTRAVENOUS
  Filled 2013-02-06: qty 2

## 2013-02-06 MED ORDER — NICOTINE 21 MG/24HR TD PT24
21.0000 mg | MEDICATED_PATCH | Freq: Every day | TRANSDERMAL | Status: DC
  Administered 2013-02-06 – 2013-02-08 (×3): 21 mg via TRANSDERMAL
  Filled 2013-02-06 (×4): qty 1

## 2013-02-06 MED ORDER — VANCOMYCIN HCL IN DEXTROSE 1-5 GM/200ML-% IV SOLN
1000.0000 mg | Freq: Once | INTRAVENOUS | Status: AC
  Administered 2013-02-06: 1000 mg via INTRAVENOUS
  Filled 2013-02-06: qty 200

## 2013-02-06 MED ORDER — KCL IN DEXTROSE-NACL 20-5-0.9 MEQ/L-%-% IV SOLN
INTRAVENOUS | Status: DC
  Administered 2013-02-06 – 2013-02-07 (×2): via INTRAVENOUS

## 2013-02-06 MED ORDER — DIPHENHYDRAMINE HCL 12.5 MG/5ML PO ELIX
12.5000 mg | ORAL_SOLUTION | Freq: Four times a day (QID) | ORAL | Status: DC | PRN
  Administered 2013-02-06 – 2013-02-07 (×2): 25 mg via ORAL
  Administered 2013-02-07: 12.5 mg via ORAL
  Administered 2013-02-07 – 2013-02-08 (×2): 25 mg via ORAL
  Filled 2013-02-06 (×2): qty 10
  Filled 2013-02-06: qty 5
  Filled 2013-02-06 (×3): qty 10

## 2013-02-06 MED ORDER — IOHEXOL 300 MG/ML  SOLN
100.0000 mL | Freq: Once | INTRAMUSCULAR | Status: AC | PRN
  Administered 2013-02-06: 100 mL via INTRAVENOUS

## 2013-02-06 NOTE — H&P (Signed)
Angel Price is an 47 y.o. female.   Chief Complaint: Draining wound on the abdominal wall HPI: Patient is a 47 year old black female who started having abdominal pain and swelling along a surgical scar 3 days ago. It started draining pus and the patient came to the emergency room for further evaluation treatment. She denies any fevers at home. No nausea or vomiting have been noted. She states she has had multiple abdominal surgeries in the past including gastrectomy with Roux-en-Y anastomosis, other abdominal surgeries. CT scan of the abdomen revealed abscesses present in the abdominal wall with a question of an enterocutaneous fistula. The patient states her drainage has been cloudy white nature. She does not have a primary care physician.  Past Medical History  Diagnosis Date  . Lupus   . Thyroid disease     Past Surgical History  Procedure Laterality Date  . Abdominal surgery    . Abdominal hysterectomy      History reviewed. No pertinent family history. Social History:  reports that she has been smoking.  She does not have any smokeless tobacco history on file. She reports that she drinks alcohol. Her drug history is not on file.  Allergies:  Allergies  Allergen Reactions  . Codeine     Hives  . Morphine And Related     gastritis     (Not in a hospital admission)  Results for orders placed during the hospital encounter of 02/06/13 (from the past 48 hour(s))  CBC WITH DIFFERENTIAL     Status: Abnormal   Collection Time    02/06/13  8:37 AM      Result Value Range   WBC 6.4  4.0 - 10.5 K/uL   RBC 4.09  3.87 - 5.11 MIL/uL   Hemoglobin 12.6  12.0 - 15.0 g/dL   HCT 16.1  09.6 - 04.5 %   MCV 89.0  78.0 - 100.0 fL   MCH 30.8  26.0 - 34.0 pg   MCHC 34.6  30.0 - 36.0 g/dL   RDW 40.9 (*) 81.1 - 91.4 %   Platelets 229  150 - 400 K/uL   Neutrophils Relative % 69  43 - 77 %   Neutro Abs 4.4  1.7 - 7.7 K/uL   Lymphocytes Relative 22  12 - 46 %   Lymphs Abs 1.4  0.7 - 4.0 K/uL    Monocytes Relative 9  3 - 12 %   Monocytes Absolute 0.6  0.1 - 1.0 K/uL   Eosinophils Relative 1  0 - 5 %   Eosinophils Absolute 0.1  0.0 - 0.7 K/uL   Basophils Relative 0  0 - 1 %   Basophils Absolute 0.0  0.0 - 0.1 K/uL  COMPREHENSIVE METABOLIC PANEL     Status: Abnormal   Collection Time    02/06/13  8:37 AM      Result Value Range   Sodium 137  135 - 145 mEq/L   Potassium 4.2  3.5 - 5.1 mEq/L   Chloride 100  96 - 112 mEq/L   CO2 25  19 - 32 mEq/L   Glucose, Bld 84  70 - 99 mg/dL   BUN 8  6 - 23 mg/dL   Creatinine, Ser 7.82  0.50 - 1.10 mg/dL   Calcium 9.2  8.4 - 95.6 mg/dL   Total Protein 7.9  6.0 - 8.3 g/dL   Albumin 3.6  3.5 - 5.2 g/dL   AST 69 (*) 0 - 37 U/L   ALT 38 (*)  0 - 35 U/L   Alkaline Phosphatase 89  39 - 117 U/L   Total Bilirubin 0.4  0.3 - 1.2 mg/dL   GFR calc non Af Amer >90  >90 mL/min   GFR calc Af Amer >90  >90 mL/min   Comment: (NOTE)     The eGFR has been calculated using the CKD EPI equation.     This calculation has not been validated in all clinical situations.     eGFR's persistently <90 mL/min signify possible Chronic Kidney     Disease.   Ct Abdomen Pelvis W Contrast  02/06/2013   CLINICAL DATA:  Vomiting.  Nausea.  EXAM: CT ABDOMEN AND PELVIS WITH CONTRAST  TECHNIQUE: Multidetector CT imaging of the abdomen and pelvis was performed using the standard protocol following bolus administration of intravenous contrast.  CONTRAST:  50mL OMNIPAQUE IOHEXOL 300 MG/ML SOLN, OMNIPAQUE IOHEXOL 300 MG/ML SOLN  COMPARISON:  None.  FINDINGS: The lung bases appear clear. No pleural or pericardial effusion. There is mild diffuse fatty infiltration of the liver. No focal fatty stress set no focal liver abnormality identified. Previous coli cystectomy. The pancreas is unremarkable. No intrahepatic bile duct dilatation. Normal appearance of the spleen.  Left adrenal nodule measures 1.9 cm in 34.6 HU, image 20/ series 2. Surgical clips are noted in the right  adrenal bed. The right adrenal gland does not visualized and may be surgically absent. Normal appearance of the right kidney.  The urinary bladder appears within normal limits. Previous hysterectomy.Within the left adnexal region there is a round, 2.8 cm intermediate attenuating structure, image 73/ series 2.  Mild calcified atherosclerotic disease affects the abdominal aorta. There is no upper abdominal or pelvic adenopathy.  Postoperative changes involving the stomach are noted. A gastrojejunostomy has been performed. The small bowel loops have a normal caliber. Normal appearance of the colon.  There are multiple periumbilical, peripherally enhancing fluid collections within the ventral abdominal wall. The largest measures 3.5 x 2.4 by 2.7 cm and is left of the midline, image number 51/series 2. Eighth there may be a fistula tract communicating is worth noting that the small bowel loops are closely associated with the undersurface of the ventral abdominal wall, image number 52/series 2 and enterocutaneous fistula cannot be excluded.  Review of the visualized osseous structures is unremarkable.  IMPRESSION: 1. Examination is positive for multiple periumbilical fluid collections within the ventral abdominal wall. Cannot rule out enterocutaneous fistula.  2. Round, indeterminate intermediate attenuating structure within the left pelvis.  3. Indeterminate left adrenal nodule. Recommend followup, non hemorrhage imaging with adrenal gland MRI.   Electronically Signed   By: Signa Kell M.D.   On: 02/06/2013 09:23    Review of Systems  Constitutional: Positive for malaise/fatigue.  Eyes: Negative.   Respiratory: Negative.   Cardiovascular: Negative.   Gastrointestinal: Positive for abdominal pain.  Genitourinary: Negative.   Musculoskeletal: Negative.     Blood pressure 155/58, pulse 96, temperature 98.8 F (37.1 C), temperature source Oral, resp. rate 20, height 5\' 3"  (1.6 m), weight 72.576 kg (160 lb),  SpO2 99.00%. Physical Exam  Constitutional: She is oriented to person, place, and time. She appears well-developed and well-nourished.  HENT:  Head: Normocephalic and atraumatic.  Neck: Normal range of motion. Neck supple.  Cardiovascular: Normal rate, regular rhythm and normal heart sounds.   Respiratory: Effort normal and breath sounds normal.  GI: Soft. Bowel sounds are normal.  Fluctuant tender draining area along inferior aspect of umbilicus, extending  to the left side involving previous surgical scar.  Neurological: She is alert and oriented to person, place, and time.  Skin: Skin is warm and dry. She is not diaphoretic.     Assessment/Plan Impression: Cellulitis with abscess, abdominal wall. Clinically, no apparent enterocutaneous fistula seen as purulent drainage is white in nature. Plan: We'll admit the patient for IV antibiotics and monitoring. No need for acute surgical intervention at this time as the wound is draining. May need exploration of the abdominal wall should the abscess cavity not significantly improved. This was explained to the patient, who agrees to the treatment plan.  Alyxander Kollmann A 02/06/2013, 11:36 AM

## 2013-02-06 NOTE — ED Notes (Signed)
Complain of n/v and abdominal pain for two days. Pt also has an open sore to left of belly button

## 2013-02-06 NOTE — ED Notes (Signed)
Pt finished contrast CT aware

## 2013-02-06 NOTE — ED Notes (Signed)
Pt drinking contrast. 

## 2013-02-06 NOTE — ED Provider Notes (Signed)
CSN: [id number]     Arrival date & time 02/06/13  0750 History  This chart was scribed for Benny Lennert, MD,  by Ashley Jacobs, ED Scribe. The patient was seen in room APA11/APA11 and the patient's care was started at 8:12 AM.   First MD Initiated Contact with Patient 02/06/13 0804     Chief Complaint  Patient presents with  . Abdominal Pain   (Consider location/radiation/quality/duration/timing/severity/associated sxs/prior Treatment) Patient is a 47 y.o. female presenting with abdominal pain. The history is provided by the patient and medical records. No language interpreter was used.  Abdominal Pain Pain location:  LLQ Pain radiates to:  Does not radiate Pain severity:  Moderate Duration:  1 week Timing:  Constant Progression:  Unchanged Context: previous surgery   Relieved by:  Nothing Associated symptoms: constipation, nausea and vomiting   Associated symptoms: no diarrhea   Vomiting:    Severity:  Moderate   Duration:  1 week   Timing:  Constant   Progression:  Unchanged  HPI Comments: Kristinia Leavy is a 47 y.o. female who comes to the Emergency Department complaining of constant moderate abdominal pain that presents with swelling for the past few days (more than three days). She reports the associated symptoms of constipation, nausea and vomiting for a week. She has an incision to her LLQ abdominal wall that is painful and swollen. The site is painful to palpation. Pt denies diarrhea. She is currently taking antibiotics. Nothing seems to resolve the pain. She has a hx of a hysterectomy. Pt smokes tobacco and drinks alcohol.  Dr. Wellington Hampshire performed her abdominal surgery in 2006.  Past Medical History  Diagnosis Date  . Lupus   . Thyroid disease    Past Surgical History  Procedure Laterality Date  . Abdominal surgery    . Abdominal hysterectomy     History reviewed. No pertinent family history. History  Substance Use Topics  . Smoking status: Current Every  Day Smoker  . Smokeless tobacco: Not on file  . Alcohol Use: Yes   OB History   Grav Para Term Preterm Abortions TAB SAB Ect Mult Living                 Review of Systems  Gastrointestinal: Positive for nausea, vomiting, abdominal pain and constipation. Negative for diarrhea.  All other systems reviewed and are negative.    Allergies  Review of patient's allergies indicates no known allergies.  Home Medications  No current outpatient prescriptions on file. BP 155/58  Pulse 96  Temp(Src) 98.8 F (37.1 C) (Oral)  Resp 20  Ht 5\' 3"  (1.6 m)  Wt 160 lb (72.576 kg)  BMI 28.35 kg/m2  SpO2 99% Physical Exam  Nursing note and vitals reviewed. Constitutional: She is oriented to person, place, and time. She appears well-developed.  HENT:  Head: Normocephalic.  Eyes: Conjunctivae and EOM are normal. No scleral icterus.  Neck: Neck supple. No thyromegaly present.  Cardiovascular: Normal rate and regular rhythm.  Exam reveals no gallop and no friction rub.   No murmur heard. Pulmonary/Chest: No stridor. She has no wheezes. She has no rales. She exhibits no tenderness.  Abdominal: There is tenderness.  Ventricular incision well healed Inferior LLQ abdominal wall tenderness and swelling  Musculoskeletal: Normal range of motion. She exhibits no edema and no tenderness.  Lymphadenopathy:    She has no cervical adenopathy.  Neurological: She is oriented to person, place, and time. She exhibits normal muscle tone. Coordination normal.  Skin: No rash noted. No erythema.  Psychiatric: She has a normal mood and affect. Her behavior is normal.    ED Course  Procedures (including critical care time) DIAGNOSTIC STUDIES: Oxygen Saturation is 99% on room air, normal by my interpretation.    COORDINATION OF CARE: 8:15 AM Discussed course of care with pt which includes Zofran 4mg , 0.9 % sodium chloride infusion  CT of abdomen/pelvis and laboratory tests . Pt understands and agrees. 10:29  AM Discussed with pt laboratory result and mentions consulting with surgeon about possible admittance. Pt understands and agrees.   Labs Review Labs Reviewed - No data to display Imaging Review No results found.  EKG Interpretation   None       MDM  Abdominal wall infection.  Admit to surgery   Benny Lennert, MD 02/06/13 1145

## 2013-02-06 NOTE — ED Notes (Signed)
eval by Dr. Lovell Sheehan. Pt to be admit overnight

## 2013-02-07 ENCOUNTER — Encounter (HOSPITAL_COMMUNITY): Payer: Self-pay | Admitting: General Practice

## 2013-02-07 LAB — CBC
Hemoglobin: 10.8 g/dL — ABNORMAL LOW (ref 12.0–15.0)
MCH: 31.4 pg (ref 26.0–34.0)
MCHC: 35.1 g/dL (ref 30.0–36.0)
MCV: 89.5 fL (ref 78.0–100.0)
RBC: 3.44 MIL/uL — ABNORMAL LOW (ref 3.87–5.11)

## 2013-02-07 LAB — BASIC METABOLIC PANEL
BUN: 8 mg/dL (ref 6–23)
CO2: 27 mEq/L (ref 19–32)
Calcium: 8.7 mg/dL (ref 8.4–10.5)
Glucose, Bld: 119 mg/dL — ABNORMAL HIGH (ref 70–99)
Potassium: 3.5 mEq/L (ref 3.5–5.1)
Sodium: 134 mEq/L — ABNORMAL LOW (ref 135–145)

## 2013-02-07 MED ORDER — ZOLPIDEM TARTRATE 5 MG PO TABS
5.0000 mg | ORAL_TABLET | Freq: Every evening | ORAL | Status: DC | PRN
  Administered 2013-02-07: 5 mg via ORAL
  Filled 2013-02-07: qty 1

## 2013-02-07 MED ORDER — CHLORHEXIDINE GLUCONATE 4 % EX LIQD
1.0000 "application " | Freq: Once | CUTANEOUS | Status: AC
  Administered 2013-02-07: 1 via TOPICAL
  Filled 2013-02-07: qty 15

## 2013-02-07 NOTE — Progress Notes (Signed)
Subjective: Still with pain and drainage from abdomen wall.  Objective: Vital signs in last 24 hours: Temp:  [98 F (36.7 C)-98.7 F (37.1 C)] 98 F (36.7 C) (11/23 0700) Pulse Rate:  [74-87] 76 (11/23 0700) Resp:  [20] 20 (11/23 0700) BP: (110-156)/(86-110) 110/86 mmHg (11/23 0700) SpO2:  [97 %-100 %] 100 % (11/23 0700) Weight:  [67.586 kg (149 lb)] 67.586 kg (149 lb) (11/22 1345) Last BM Date: 02/04/13  Intake/Output from previous day: 11/22 0701 - 11/23 0700 In: 398.3 [P.O.:240; I.V.:158.3] Out: -  Intake/Output this shift:    General appearance: alert, cooperative and no distress Resp: clear to auscultation bilaterally Cardio: regular rate and rhythm, S1, S2 normal, no murmur, click, rub or gallop GI: Soft. Draining fluctuant area along the lower abdomen and periumbilical region.  Lab Results:   Recent Labs  02/06/13 0837 02/07/13 0457  WBC 6.4 4.7  HGB 12.6 10.8*  HCT 36.4 30.8*  PLT 229 196   BMET  Recent Labs  02/06/13 0837 02/07/13 0457  NA 137 134*  K 4.2 3.5  CL 100 100  CO2 25 27  GLUCOSE 84 119*  BUN 8 8  CREATININE 0.52 0.61  CALCIUM 9.2 8.7   PT/INR No results found for this basename: LABPROT, INR,  in the last 72 hours  Studies/Results: Ct Abdomen Pelvis W Contrast  02/06/2013   CLINICAL DATA:  Vomiting.  Nausea.  EXAM: CT ABDOMEN AND PELVIS WITH CONTRAST  TECHNIQUE: Multidetector CT imaging of the abdomen and pelvis was performed using the standard protocol following bolus administration of intravenous contrast.  CONTRAST:  50mL OMNIPAQUE IOHEXOL 300 MG/ML SOLN, OMNIPAQUE IOHEXOL 300 MG/ML SOLN  COMPARISON:  None.  FINDINGS: The lung bases appear clear. No pleural or pericardial effusion. There is mild diffuse fatty infiltration of the liver. No focal fatty stress set no focal liver abnormality identified. Previous coli cystectomy. The pancreas is unremarkable. No intrahepatic bile duct dilatation. Normal appearance of the spleen.   Left adrenal nodule measures 1.9 cm in 34.6 HU, image 20/ series 2. Surgical clips are noted in the right adrenal bed. The right adrenal gland does not visualized and may be surgically absent. Normal appearance of the right kidney.  The urinary bladder appears within normal limits. Previous hysterectomy.Within the left adnexal region there is a round, 2.8 cm intermediate attenuating structure, image 73/ series 2.  Mild calcified atherosclerotic disease affects the abdominal aorta. There is no upper abdominal or pelvic adenopathy.  Postoperative changes involving the stomach are noted. A gastrojejunostomy has been performed. The small bowel loops have a normal caliber. Normal appearance of the colon.  There are multiple periumbilical, peripherally enhancing fluid collections within the ventral abdominal wall. The largest measures 3.5 x 2.4 by 2.7 cm and is left of the midline, image number 51/series 2. Eighth there may be a fistula tract communicating is worth noting that the small bowel loops are closely associated with the undersurface of the ventral abdominal wall, image number 52/series 2 and enterocutaneous fistula cannot be excluded.  Review of the visualized osseous structures is unremarkable.  IMPRESSION: 1. Examination is positive for multiple periumbilical fluid collections within the ventral abdominal wall. Cannot rule out enterocutaneous fistula.  2. Round, indeterminate intermediate attenuating structure within the left pelvis.  3. Indeterminate left adrenal nodule. Recommend followup, non hemorrhage imaging with adrenal gland MRI.   Electronically Signed   By: Signa Kell M.D.   On: 02/06/2013 09:23    Anti-infectives: Anti-infectives  Start     Dose/Rate Route Frequency Ordered Stop   02/06/13 1400  piperacillin-tazobactam (ZOSYN) IVPB 3.375 g     3.375 g 12.5 mL/hr over 240 Minutes Intravenous 3 times per day 02/06/13 1244     02/06/13 1045  vancomycin (VANCOCIN) IVPB 1000 mg/200 mL  premix     1,000 mg 200 mL/hr over 60 Minutes Intravenous  Once 02/06/13 1042 02/06/13 1150      Assessment/Plan: s/p Procedure(s): INCISION AND DRAINAGE ABSCESS Impression: Continued drainage from the abdominal wall abscess without worsening cellulitis. Plan: We'll take patient to the operating room tomorrow for incision and drainage of abdominal wall abscess. The risks and benefits of the procedure were fully explained to the patient, who gave informed consent.  LOS: 1 day    Wesly Whisenant A 02/07/2013

## 2013-02-08 ENCOUNTER — Encounter (HOSPITAL_COMMUNITY): Payer: Self-pay

## 2013-02-08 ENCOUNTER — Encounter (HOSPITAL_COMMUNITY): Payer: Medicare Other | Admitting: Anesthesiology

## 2013-02-08 ENCOUNTER — Inpatient Hospital Stay (HOSPITAL_COMMUNITY): Payer: Medicare Other | Admitting: Anesthesiology

## 2013-02-08 ENCOUNTER — Encounter (HOSPITAL_COMMUNITY)
Admission: EM | Disposition: A | Discharge status: Home / Self Care | Payer: Self-pay | Source: Home / Self Care | Attending: General Surgery

## 2013-02-08 HISTORY — PX: DEBRIDEMENT OF ABDOMINAL WALL ABSCESS: SHX6396

## 2013-02-08 LAB — SURGICAL PCR SCREEN: MRSA, PCR: NEGATIVE

## 2013-02-08 SURGERY — DEBRIDEMENT OF ABDOMINAL WALL ABSCESS
Anesthesia: General | Site: Abdomen | Wound class: Dirty or Infected

## 2013-02-08 MED ORDER — LACTATED RINGERS IV SOLN: INTRAVENOUS | Status: DC

## 2013-02-08 MED ORDER — FENTANYL CITRATE 0.05 MG/ML IJ SOLN
INTRAMUSCULAR | Status: DC | PRN
  Administered 2013-02-08 (×2): 50 ug via INTRAVENOUS

## 2013-02-08 MED ORDER — LACTATED RINGERS IV SOLN
INTRAVENOUS | Status: DC
  Administered 2013-02-08: 11:00:00 via INTRAVENOUS

## 2013-02-08 MED ORDER — BUPIVACAINE HCL (PF) 0.5 % IJ SOLN
INTRAMUSCULAR | Status: DC | PRN
  Administered 2013-02-08: 7 mL

## 2013-02-08 MED ORDER — FENTANYL CITRATE 0.05 MG/ML IJ SOLN
INTRAMUSCULAR | Status: AC
Start: 2013-02-08 — End: 2013-02-08
  Filled 2013-02-08: qty 2

## 2013-02-08 MED ORDER — BUPIVACAINE HCL (PF) 0.5 % IJ SOLN
INTRAMUSCULAR | Status: AC
  Filled 2013-02-08: qty 30

## 2013-02-08 MED ORDER — PROPOFOL 10 MG/ML IV BOLUS
INTRAVENOUS | Status: DC | PRN
  Administered 2013-02-08: 150 mg via INTRAVENOUS
  Administered 2013-02-08: 20 mg via INTRAVENOUS
  Administered 2013-02-08: 50 mg via INTRAVENOUS

## 2013-02-08 MED ORDER — LIDOCAINE HCL (PF) 1 % IJ SOLN
INTRAMUSCULAR | Status: AC
  Filled 2013-02-08: qty 5

## 2013-02-08 MED ORDER — SODIUM CHLORIDE 0.9 % IR SOLN
Status: DC | PRN
  Administered 2013-02-08: 500 mL

## 2013-02-08 MED ORDER — PROPOFOL 10 MG/ML IV EMUL
INTRAVENOUS | Status: AC
  Filled 2013-02-08: qty 20

## 2013-02-08 MED ORDER — POVIDONE-IODINE 10 % EX OINT
TOPICAL_OINTMENT | CUTANEOUS | Status: AC
  Filled 2013-02-08: qty 1

## 2013-02-08 MED ORDER — FENTANYL CITRATE 0.05 MG/ML IJ SOLN
25.0000 ug | INTRAMUSCULAR | Status: DC | PRN
  Administered 2013-02-08 (×2): 50 ug via INTRAVENOUS

## 2013-02-08 MED ORDER — ONDANSETRON HCL 4 MG/2ML IJ SOLN
INTRAMUSCULAR | Status: AC
  Filled 2013-02-08: qty 2

## 2013-02-08 MED ORDER — FENTANYL CITRATE 0.05 MG/ML IJ SOLN
INTRAMUSCULAR | Status: AC
  Filled 2013-02-08: qty 2

## 2013-02-08 MED ORDER — MIDAZOLAM HCL 2 MG/2ML IJ SOLN
1.0000 mg | INTRAMUSCULAR | Status: DC | PRN
  Administered 2013-02-08: 2 mg via INTRAVENOUS

## 2013-02-08 MED ORDER — ONDANSETRON HCL 4 MG/2ML IJ SOLN: 4.0000 mg | Freq: Once | INTRAMUSCULAR | Status: AC | PRN

## 2013-02-08 MED ORDER — MIDAZOLAM HCL 2 MG/2ML IJ SOLN
INTRAMUSCULAR | Status: AC
  Filled 2013-02-08: qty 2

## 2013-02-08 MED ORDER — KETOROLAC TROMETHAMINE 30 MG/ML IJ SOLN
30.0000 mg | Freq: Once | INTRAMUSCULAR | Status: AC
  Administered 2013-02-08: 30 mg via INTRAVENOUS

## 2013-02-08 MED ORDER — FENTANYL CITRATE 0.05 MG/ML IJ SOLN
25.0000 ug | INTRAMUSCULAR | Status: DC
  Administered 2013-02-08: 25 ug via INTRAVENOUS

## 2013-02-08 MED ORDER — LIDOCAINE HCL (CARDIAC) 10 MG/ML IV SOLN
INTRAVENOUS | Status: DC | PRN
  Administered 2013-02-08: 20 mg via INTRAVENOUS

## 2013-02-08 MED ORDER — ONDANSETRON HCL 4 MG/2ML IJ SOLN
4.0000 mg | Freq: Once | INTRAMUSCULAR | Status: AC
  Administered 2013-02-08: 4 mg via INTRAVENOUS

## 2013-02-08 SURGICAL SUPPLY — 27 items
BAG HAMPER (MISCELLANEOUS) ×3 IMPLANT
BANDAGE CONFORM 2  STR LF (GAUZE/BANDAGES/DRESSINGS) ×1 IMPLANT
CLOTH BEACON ORANGE TIMEOUT ST (SAFETY) ×3 IMPLANT
COVER LIGHT HANDLE STERIS (MISCELLANEOUS) ×6 IMPLANT
DECANTER SPIKE VIAL GLASS SM (MISCELLANEOUS) ×2 IMPLANT
ELECT REM PT RETURN 9FT ADLT (ELECTROSURGICAL) ×3
ELECTRODE REM PT RTRN 9FT ADLT (ELECTROSURGICAL) ×2 IMPLANT
GLOVE BIOGEL M STRL SZ7.5 (GLOVE) ×3 IMPLANT
GLOVE ECLIPSE 7.0 STRL STRAW (GLOVE) ×2 IMPLANT
GLOVE EXAM NITRILE MD LF STRL (GLOVE) ×2 IMPLANT
GLOVE INDICATOR 7.5 STRL GRN (GLOVE) ×2 IMPLANT
GOWN STRL REIN XL XLG (GOWN DISPOSABLE) ×6 IMPLANT
KIT ROOM TURNOVER APOR (KITS) ×3 IMPLANT
MANIFOLD NEPTUNE II (INSTRUMENTS) ×3 IMPLANT
MARKER SKIN DUAL TIP RULER LAB (MISCELLANEOUS) ×1 IMPLANT
NS IRRIG 1000ML POUR BTL (IV SOLUTION) ×3 IMPLANT
PACK BASIC LIMB (CUSTOM PROCEDURE TRAY) IMPLANT
PACK MINOR (CUSTOM PROCEDURE TRAY) ×3 IMPLANT
PAD ABD 5X9 TENDERSORB (GAUZE/BANDAGES/DRESSINGS) IMPLANT
PAD ARMBOARD 7.5X6 YLW CONV (MISCELLANEOUS) ×3 IMPLANT
SET BASIN LINEN APH (SET/KITS/TRAYS/PACK) ×3 IMPLANT
SPONGE GAUZE 4X4 12PLY (GAUZE/BANDAGES/DRESSINGS) ×2 IMPLANT
SUT PROLENE 3 0 PS 1 (SUTURE) ×4 IMPLANT
SWAB CULTURE LIQ STUART DBL (MISCELLANEOUS) IMPLANT
SYR BULB IRRIGATION 50ML (SYRINGE) ×3 IMPLANT
SYR CONTROL 10ML LL (SYRINGE) ×2 IMPLANT
TAPE CLOTH SURG 4X10 WHT LF (GAUZE/BANDAGES/DRESSINGS) ×2 IMPLANT

## 2013-02-08 NOTE — Transfer of Care (Addendum)
Immediate Anesthesia Transfer of Care Note  Patient: Angel Price  Procedure(s) Performed: Procedure(s) (LRB): DEBRIDEMENT OF ABDOMINAL WALL ABSCESS (N/A)  Patient Location: PACU  Anesthesia Type: General  Level of Consciousness: awake  Airway & Oxygen Therapy: Patient Spontanous Breathing   Post-op Assessment: Report given to PACU RN, Post -op Vital signs reviewed and stable and Patient moving all extremities  Post vital signs: Reviewed and stable  Complications: No apparent anesthesia complications

## 2013-02-08 NOTE — Progress Notes (Signed)
Utilization Review Complete  

## 2013-02-08 NOTE — Anesthesia Postprocedure Evaluation (Signed)
  Anesthesia Post-op Note  Patient: Angel Price  Procedure(s) Performed: Procedure(s): DEBRIDEMENT OF ABDOMINAL WALL ABSCESS (N/A)  Patient Location: PACU  Anesthesia Type:General  Level of Consciousness: awake, alert  and patient cooperative  Airway and Oxygen Therapy: Patient Spontanous Breathing  Post-op Pain: moderate  Post-op Assessment: Post-op Vital signs reviewed, Patient's Cardiovascular Status Stable, Respiratory Function Stable and Patent Airway  Post-op Vital Signs: Reviewed and stable  Complications: No apparent anesthesia complications

## 2013-02-08 NOTE — Anesthesia Procedure Notes (Signed)
Procedure Name: LMA Insertion Date/Time: 02/08/2013 11:11 AM Performed by: Franco Nones Pre-anesthesia Checklist: Patient identified, Patient being monitored, Emergency Drugs available, Timeout performed and Suction available Patient Re-evaluated:Patient Re-evaluated prior to inductionOxygen Delivery Method: Circle System Utilized Preoxygenation: Pre-oxygenation with 100% oxygen Intubation Type: IV induction Ventilation: Mask ventilation without difficulty LMA: LMA inserted LMA Size: 3.0 Number of attempts: 1 Placement Confirmation: positive ETCO2 and breath sounds checked- equal and bilateral Tube secured with: Tape

## 2013-02-08 NOTE — Op Note (Signed)
Patient:  Angel Price  DOB:  07-05-1965  MRN:  [id number]   Preop Diagnosis:  Cellulitis, abdominal wall  Postop Diagnosis:  Same, granuloma and necrotic subcutaneous tissue, abdominal wall  Procedure:  Debridement of abdominal wall  Surgeon:  Franky Macho, M.D.  Anes:  General  Indications:  Patient is a 47 year old black female status post multiple abdominal surgeries at another facility in the past with wound care healing issues who presents with an indurated area along the lower aspect of the surgical scar site, inferior to the umbilicus. She does have some whitish drainage from the wound. Initial cultures are negative for any bacteria. The patient comes the operating room for exploration. The risks and benefits of the procedure were fully explained to the patient, who gave informed consent.  Procedure note:  The patient is placed the supine position. After general anesthesia was administered, the abdomen was prepped and draped using usual sterile technique with Betadine. Surgical site confirmation was performed.  The indurated area measuring approximately 4-5 cm in its greatest diameter. An incision was made into this region and no significant abscess cavity was found. A significant amount of necrotic adipose tissue was found down to the fascia. Some of the fascia superficially was debrided, though I never entered the abdominal cavity. This did not appear to weaken the abdominal wall. I suspect that this tissue was necrotic and granulomatous in nature do to her previous poor healing from surgeries. The wound was irrigated normal saline. 0.5% Sensorcaine was instilled the surrounding wound. The skin was reapproximated using a 3-0 Prolene vertical mattress suture. Betadine ointment and dry sterile dressing were applied.  All tape and needle counts were correct at the end of the procedure. Patient was awakened and transferred to PACU in stable condition.  Complications:  None  EBL:   Minimal  Specimen:  None

## 2013-02-08 NOTE — Anesthesia Preprocedure Evaluation (Signed)
Anesthesia Evaluation  Patient identified by MRN, date of birth, ID band Patient awake    Reviewed: Allergy & Precautions, H&P , NPO status , Patient's Chart, lab work & pertinent test results  History of Anesthesia Complications Negative for: history of anesthetic complications  Airway Mallampati: I TM Distance: >3 FB     Dental  (+) Teeth Intact   Pulmonary Current Smoker,  breath sounds clear to auscultation        Cardiovascular hypertension, Rhythm:Regular Rate:Normal     Neuro/Psych    GI/Hepatic   Endo/Other  Hyperthyroidism   Renal/GU      Musculoskeletal   Abdominal   Peds  Hematology   Anesthesia Other Findings   Reproductive/Obstetrics                           Anesthesia Physical Anesthesia Plan  ASA: II  Anesthesia Plan: General   Post-op Pain Management:    Induction: Intravenous  Airway Management Planned: LMA  Additional Equipment:   Intra-op Plan:   Post-operative Plan: Extubation in OR  Informed Consent: I have reviewed the patients History and Physical, chart, labs and discussed the procedure including the risks, benefits and alternatives for the proposed anesthesia with the patient or authorized representative who has indicated his/her understanding and acceptance.     Plan Discussed with:   Anesthesia Plan Comments:         Anesthesia Quick Evaluation

## 2013-02-09 LAB — CBC
Hemoglobin: 10.5 g/dL — ABNORMAL LOW (ref 12.0–15.0)
MCV: 91.4 fL (ref 78.0–100.0)
Platelets: 208 10*3/uL (ref 150–400)
RBC: 3.36 MIL/uL — ABNORMAL LOW (ref 3.87–5.11)
RDW: 16.4 % — ABNORMAL HIGH (ref 11.5–15.5)
WBC: 5 10*3/uL (ref 4.0–10.5)

## 2013-02-09 LAB — BASIC METABOLIC PANEL
CO2: 28 mEq/L (ref 19–32)
Chloride: 105 mEq/L (ref 96–112)
GFR calc Af Amer: >90 mL/min (ref >90)
Potassium: 3.7 mEq/L (ref 3.5–5.1)
Sodium: 140 mEq/L (ref 135–145)

## 2013-02-09 LAB — WOUND CULTURE: Gram Stain: NONE SEEN

## 2013-02-09 MED ORDER — HYDROCODONE-ACETAMINOPHEN 5-325 MG PO TABS: 1.0000 | ORAL_TABLET | ORAL | Status: DC | PRN

## 2013-02-09 MED ORDER — SULFAMETHOXAZOLE-TRIMETHOPRIM 400-80 MG PO TABS: 1.0000 | ORAL_TABLET | Freq: Two times a day (BID) | ORAL | Status: DC

## 2013-02-09 MED ORDER — POVIDONE-IODINE 10 % OINT PACKET
TOPICAL_OINTMENT | CUTANEOUS | Status: DC | PRN
  Administered 2013-02-08: 1 via TOPICAL

## 2013-02-09 NOTE — Progress Notes (Signed)
After 3 attempts to contact pt, CM was able to reach pt. Appt with [facility] on [address] given to pt. Appt is on 12/2 at 8:30. To arrive at 8:20 with insurance card, ID, and list of medications. Pt seemed appreciative.

## 2013-02-09 NOTE — Discharge Summary (Signed)
Physician Discharge Summary  Patient ID: Angel Price MRN: [id number] DOB/AGE: Aug 04, 1965 47 y.o.  Admit date: 02/06/2013 Discharge date: 02/09/2013  Admission Diagnoses: Cellulitis, abdominal wall Discharge Diagnoses: Same, necrotic subcutaneous tissue, old surgical wound Active Problems:   * No active hospital problems. *   Discharged Condition: good  Hospital Course: Patient is a 47 year old black female who has had multiple abdominal surgeries in the past with postoperative wound infections who presented to [facility] with cellulitis of the abdominal wall. She was started on IV antibiotics. She subsequently went to the operating room on 02/08/2013 underwent abdominal wall wound exploration. She had debridement of necrotic subcutaneous tissue. This did not extend into the abdominal cavity. No significant purulent fluid was noted. Her diet was advanced at difficulty. The patient is being discharged home on 02/09/2013 in good improving condition. She states she has taken Lortab in the past for pain without difficulty. merrily. Her postoperative course has been unremarkable.  Treatments: surgery: Debridement of subcutaneous tissue, abdominal wall on 02/08/2013   Discharge Exam: Blood pressure 110/79, pulse 70, temperature 98.4 F (36.9 C), temperature source Oral, resp. rate 20, height 5\' 3"  (1.6 m), weight 67.586 kg (149 lb), SpO2 95.00%. General appearance: alert, cooperative and no distress Resp: clear to auscultation bilaterally Cardio: regular rate and rhythm, S1, S2 normal, no murmur, click, rub or gallop GI: Soft. Incision healing well.  Disposition: Home     Medication List    STOP taking these medications       GOODYS BODY PAIN PO      TAKE these medications       ADVIL 200 MG tablet  Generic drug:  ibuprofen  Take 200 mg by mouth every 8 (eight) hours as needed.     ALEVE 220 MG tablet  Generic drug:  naproxen sodium  Take 220 mg by mouth daily as  needed (pain).     HYDROcodone-acetaminophen 5-325 MG per tablet  Commonly known as:  NORCO/VICODIN  Take 1 tablet by mouth every 4 (four) hours as needed for moderate pain.     sulfamethoxazole-trimethoprim 400-80 MG per tablet  Commonly known as:  BACTRIM  Take 1 tablet by mouth 2 (two) times daily.           Follow-up Information   Follow up with Dalia Heading, MD. Schedule an appointment as soon as possible for a visit on 02/18/2013.   Specialty:  General Surgery   Contact information:   [address] [place] [address] [phone]       Signed: Franky Macho A 02/09/2013, 8:41 AM

## 2013-02-10 ENCOUNTER — Encounter (HOSPITAL_COMMUNITY): Payer: Self-pay | Admitting: General Surgery

## 2013-02-14 ENCOUNTER — Emergency Department (HOSPITAL_COMMUNITY)
Admission: EM | Admit: 2013-02-14 | Discharge: 2013-02-14 | Disposition: A | Discharge status: Home / Self Care | Payer: Medicare Other | Attending: Emergency Medicine | Admitting: Emergency Medicine

## 2013-02-14 ENCOUNTER — Encounter (HOSPITAL_COMMUNITY): Payer: Self-pay | Admitting: Emergency Medicine

## 2013-02-14 DIAGNOSIS — Z792 Long term (current) use of antibiotics: Secondary | ICD-10-CM | POA: Diagnosis not present

## 2013-02-14 DIAGNOSIS — Z9889 Other specified postprocedural states: Secondary | ICD-10-CM | POA: Diagnosis not present

## 2013-02-14 DIAGNOSIS — T8131XA Disruption of external operation (surgical) wound, not elsewhere classified, initial encounter: Secondary | ICD-10-CM | POA: Diagnosis not present

## 2013-02-14 DIAGNOSIS — Z8739 Personal history of other diseases of the musculoskeletal system and connective tissue: Secondary | ICD-10-CM | POA: Diagnosis not present

## 2013-02-14 DIAGNOSIS — F17200 Nicotine dependence, unspecified, uncomplicated: Secondary | ICD-10-CM | POA: Diagnosis not present

## 2013-02-14 DIAGNOSIS — Z862 Personal history of diseases of the blood and blood-forming organs and certain disorders involving the immune mechanism: Secondary | ICD-10-CM | POA: Insufficient documentation

## 2013-02-14 DIAGNOSIS — Z872 Personal history of diseases of the skin and subcutaneous tissue: Secondary | ICD-10-CM | POA: Insufficient documentation

## 2013-02-14 DIAGNOSIS — Y838 Other surgical procedures as the cause of abnormal reaction of the patient, or of later complication, without mention of misadventure at the time of the procedure: Secondary | ICD-10-CM | POA: Insufficient documentation

## 2013-02-14 DIAGNOSIS — Z8639 Personal history of other endocrine, nutritional and metabolic disease: Secondary | ICD-10-CM | POA: Insufficient documentation

## 2013-02-14 NOTE — ED Provider Notes (Signed)
CSN: [id number]     Arrival date & time 02/14/13  2016 History  This chart was scribed for Toy Baker, MD by Blanchard Kelch, ED Scribe. The patient was seen in room APA09/APA09. Patient's care was started at 8:42 PM.      Chief Complaint  Patient presents with  . Wound Dehiscence    The history is provided by the patient. No language interpreter was used.    HPI Comments: Angel Price is a 47 y.o. female who presents to the Emergency Department complaining of wound dehiscence on her abdomen that occurred last night. She has had constant pain to the area. The area has been draining, but it has subsided slightly since last night. She had abdominal surgery done on 02/08/13 by Dr. Lovell Sheehan to "remove scar tissue and infection under the skin." She has a follow up appointment with him on 12/4, but she wanted to get it checked before then. She denies fever.    Past Medical History  Diagnosis Date  . Lupus   . Thyroid disease    Past Surgical History  Procedure Laterality Date  . Abdominal surgery    . Abdominal hysterectomy    . Debridement of abdominal wall abscess N/A 02/08/2013    Procedure: DEBRIDEMENT OF ABDOMINAL WALL ABSCESS;  Surgeon: Dalia Heading, MD;  Location: [facility];  Service: General;  Laterality: N/A;   History reviewed. No pertinent family history. History  Substance Use Topics  . Smoking status: Current Every Day Smoker  . Smokeless tobacco: Not on file  . Alcohol Use: Yes   OB History   Grav Para Term Preterm Abortions TAB SAB Ect Mult Living                 Review of Systems  Constitutional: Negative for fever.  Skin: Positive for wound.  All other systems reviewed and are negative.    Allergies  Codeine and Morphine and related  Home Medications   Current Outpatient Rx  Name  Route  Sig  Dispense  Refill  . HYDROcodone-acetaminophen (NORCO/VICODIN) 5-325 MG per tablet   Oral   Take 1 tablet by mouth every 4 (four) hours as needed for moderate  pain.   40 tablet   0   . ibuprofen (ADVIL) 200 MG tablet   Oral   Take 200 mg by mouth every 8 (eight) hours as needed.         . naproxen sodium (ALEVE) 220 MG tablet   Oral   Take 220 mg by mouth daily as needed (pain).         Marland Kitchen sulfamethoxazole-trimethoprim (BACTRIM) 400-80 MG per tablet   Oral   Take 1 tablet by mouth 2 (two) times daily.   14 tablet   0    Triage Vitals: BP 146/104  Pulse 112  Temp(Src) 98.4 F (36.9 C) (Oral)  Resp 20  Ht 5\' 3"  (1.6 m)  Wt 149 lb (67.586 kg)  BMI 26.40 kg/m2  SpO2 98%  Physical Exam  Nursing note and vitals reviewed. Constitutional: She is oriented to person, place, and time. She appears well-developed and well-nourished.  Non-toxic appearance. No distress.  HENT:  Head: Normocephalic and atraumatic.  Eyes: Conjunctivae, EOM and lids are normal. Pupils are equal, round, and reactive to light.  Neck: Normal range of motion. Neck supple. No tracheal deviation present. No mass present.  Cardiovascular: Normal rate.  Exam reveals no gallop.   No murmur heard. Pulmonary/Chest: Effort normal and breath sounds  normal. No stridor. No respiratory distress. She has no decreased breath sounds. She has no wheezes. She has no rhonchi. She has no rales.  Abdominal: Soft. Normal appearance and bowel sounds are normal. She exhibits no distension. There is no tenderness. There is no rebound and no CVA tenderness.  Mid abdomen has 2 cm dehiscenced wound above infraumbilical not draining purulent material. Granulation tissue inside wound. No surrounding erythema to this area.  Musculoskeletal: Normal range of motion. She exhibits no edema and no tenderness.  Neurological: She is alert and oriented to person, place, and time. She has normal strength. No cranial nerve deficit or sensory deficit. GCS eye subscore is 4. GCS verbal subscore is 5. GCS motor subscore is 6.  Skin: Skin is warm and dry. No abrasion and no rash noted.  Psychiatric: She has  a normal mood and affect. Her speech is normal and behavior is normal.    ED Course  Procedures (including critical care time)  DIAGNOSTIC STUDIES: Oxygen Saturation is 98% on room air, normal by my interpretation.    COORDINATION OF CARE: 8:50 PM -Will have wound packed. Patient verbalizes understanding and agrees with treatment plan.    Labs Review Labs Reviewed - No data to display Imaging Review No results found.  EKG Interpretation   None       MDM  No diagnosis found.  Patient given wound care instructions to followup with her surgeon. No evidence of infection at this time.  I personally performed the services described in this documentation, which was scribed in my presence. The recorded information has been reviewed and is accurate.    Toy Baker, MD 02/14/13 2055

## 2013-02-14 NOTE — ED Notes (Addendum)
Patient reports had abdominal surgery Monday for debridement of abdominal wall abscess. Patient states noticed wound opened up today and also reports nausea, vomiting, and abdominal pain. No active bleeding at this time.

## 2013-02-16 ENCOUNTER — Ambulatory Visit: Payer: Self-pay | Admitting: Family Medicine

## 2013-02-19 ENCOUNTER — Encounter (HOSPITAL_COMMUNITY): Payer: Self-pay | Admitting: Emergency Medicine

## 2013-02-19 ENCOUNTER — Emergency Department (HOSPITAL_COMMUNITY)
Admission: EM | Admit: 2013-02-19 | Discharge: 2013-02-19 | Disposition: A | Discharge status: Home / Self Care | Payer: Medicare Other | Attending: Emergency Medicine | Admitting: Emergency Medicine

## 2013-02-19 DIAGNOSIS — K920 Hematemesis: Secondary | ICD-10-CM | POA: Diagnosis not present

## 2013-02-19 DIAGNOSIS — T8131XS Disruption of external operation (surgical) wound, not elsewhere classified, sequela: Secondary | ICD-10-CM

## 2013-02-19 DIAGNOSIS — Z8639 Personal history of other endocrine, nutritional and metabolic disease: Secondary | ICD-10-CM | POA: Insufficient documentation

## 2013-02-19 DIAGNOSIS — Z8739 Personal history of other diseases of the musculoskeletal system and connective tissue: Secondary | ICD-10-CM | POA: Diagnosis not present

## 2013-02-19 DIAGNOSIS — R109 Unspecified abdominal pain: Secondary | ICD-10-CM | POA: Diagnosis not present

## 2013-02-19 DIAGNOSIS — T8131XA Disruption of external operation (surgical) wound, not elsewhere classified, initial encounter: Secondary | ICD-10-CM | POA: Diagnosis not present

## 2013-02-19 DIAGNOSIS — F17200 Nicotine dependence, unspecified, uncomplicated: Secondary | ICD-10-CM | POA: Insufficient documentation

## 2013-02-19 DIAGNOSIS — Z862 Personal history of diseases of the blood and blood-forming organs and certain disorders involving the immune mechanism: Secondary | ICD-10-CM | POA: Insufficient documentation

## 2013-02-19 DIAGNOSIS — Z792 Long term (current) use of antibiotics: Secondary | ICD-10-CM | POA: Diagnosis not present

## 2013-02-19 DIAGNOSIS — Y838 Other surgical procedures as the cause of abnormal reaction of the patient, or of later complication, without mention of misadventure at the time of the procedure: Secondary | ICD-10-CM | POA: Insufficient documentation

## 2013-02-19 LAB — CBC WITH DIFFERENTIAL/PLATELET
Basophils Absolute: 0 10*3/uL (ref 0.0–0.1)
Basophils Relative: 0 % (ref 0–1)
Eosinophils Absolute: 0.1 10*3/uL (ref 0.0–0.7)
Eosinophils Relative: 1 % (ref 0–5)
HCT: 39.4 % (ref 36.0–46.0)
Hemoglobin: 13.7 g/dL (ref 12.0–15.0)
Lymphocytes Relative: 37 % (ref 12–46)
Lymphs Abs: 1.9 10*3/uL (ref 0.7–4.0)
MCH: 30.4 pg (ref 26.0–34.0)
MCHC: 34.8 g/dL (ref 30.0–36.0)
MCV: 87.6 fL (ref 78.0–100.0)
Monocytes Absolute: 0.5 10*3/uL (ref 0.1–1.0)
Monocytes Relative: 11 % (ref 3–12)
Neutro Abs: 2.6 10*3/uL (ref 1.7–7.7)
Neutrophils Relative %: 51 % (ref 43–77)
Platelets: 494 10*3/uL — ABNORMAL HIGH (ref 150–400)
RBC: 4.5 MIL/uL (ref 3.87–5.11)
RDW: 15.3 % (ref 11.5–15.5)
WBC: 5.1 10*3/uL (ref 4.0–10.5)

## 2013-02-19 LAB — BASIC METABOLIC PANEL
BUN: 12 mg/dL (ref 6–23)
CO2: 22 mEq/L (ref 19–32)
Calcium: 9.6 mg/dL (ref 8.4–10.5)
Chloride: 98 mEq/L (ref 96–112)
Creatinine, Ser: 0.5 mg/dL (ref 0.50–1.10)
GFR calc Af Amer: >90 mL/min (ref >90)
GFR calc non Af Amer: >90 mL/min (ref >90)
Glucose, Bld: 92 mg/dL (ref 70–99)
Potassium: 3.9 mEq/L (ref 3.5–5.1)
Sodium: 136 mEq/L (ref 135–145)

## 2013-02-19 LAB — OCCULT BLOOD, POC DEVICE: Fecal Occult Bld: NEGATIVE

## 2013-02-19 MED ORDER — ONDANSETRON HCL 4 MG/2ML IJ SOLN
4.0000 mg | Freq: Once | INTRAMUSCULAR | Status: DC
  Filled 2013-02-19: qty 2

## 2013-02-19 MED ORDER — HYDROMORPHONE HCL PF 1 MG/ML IJ SOLN
0.5000 mg | Freq: Once | INTRAMUSCULAR | Status: AC
  Administered 2013-02-19: 0.5 mg via INTRAVENOUS
  Filled 2013-02-19: qty 1

## 2013-02-19 MED ORDER — ONDANSETRON HCL 4 MG/2ML IJ SOLN
4.0000 mg | Freq: Once | INTRAMUSCULAR | Status: AC
  Administered 2013-02-19: 4 mg via INTRAVENOUS

## 2013-02-19 MED ORDER — ONDANSETRON HCL 4 MG/2ML IJ SOLN
4.0000 mg | Freq: Once | INTRAMUSCULAR | Status: AC
  Administered 2013-02-19: 4 mg via INTRAVENOUS
  Filled 2013-02-19: qty 2

## 2013-02-19 NOTE — ED Provider Notes (Signed)
CSN: [id number]     Arrival date & time 02/19/13  0417 History   First MD Initiated Contact with Patient 02/19/13 0430     Chief Complaint  Patient presents with  . Hematemesis  . Abdominal Pain    Patient is a 47 y.o. female presenting with abdominal pain. The history is provided by the patient.  Abdominal Pain Pain location: surrounding surgical site. Pain quality: aching   Pain radiates to:  Does not radiate Pain severity:  Moderate Onset quality:  Gradual Duration:  2 days Timing:  Constant Progression:  Worsening Chronicity:  Recurrent Relieved by:  Nothing Worsened by:  Movement and palpation Associated symptoms: hematemesis and vomiting   Associated symptoms: no fever    Pt reports she had recent abdominal wall surgery She reports several days ago the wound "re-opened" and since that time she has seen her surgeon and had done well.  However over past 2 days pain has increased, and tonight the wound is draining a dark material and she has had multiple episodes of vomiting that included dark/bloody material No diarrhea No rectal bleeding reported She admits to ETOH use tonight Past Medical History  Diagnosis Date  . Lupus   . Thyroid disease    Past Surgical History  Procedure Laterality Date  . Abdominal surgery    . Abdominal hysterectomy    . Debridement of abdominal wall abscess N/A 02/08/2013    Procedure: DEBRIDEMENT OF ABDOMINAL WALL ABSCESS;  Surgeon: Dalia Heading, MD;  Location: [facility];  Service: General;  Laterality: N/A;   History reviewed. No pertinent family history. History  Substance Use Topics  . Smoking status: Current Every Day Smoker  . Smokeless tobacco: Not on file  . Alcohol Use: Yes   OB History   Grav Para Term Preterm Abortions TAB SAB Ect Mult Living                 Review of Systems  Constitutional: Negative for fever.  Gastrointestinal: Positive for vomiting, abdominal pain and hematemesis.  All other systems reviewed and are  negative.    Allergies  Codeine and Morphine and related  Home Medications   Current Outpatient Rx  Name  Route  Sig  Dispense  Refill  . HYDROcodone-acetaminophen (NORCO/VICODIN) 5-325 MG per tablet   Oral   Take 1 tablet by mouth every 4 (four) hours as needed for moderate pain.   40 tablet   0   . ibuprofen (ADVIL) 200 MG tablet   Oral   Take 200 mg by mouth every 8 (eight) hours as needed.         . naproxen sodium (ALEVE) 220 MG tablet   Oral   Take 220 mg by mouth daily as needed (pain).         Marland Kitchen sulfamethoxazole-trimethoprim (BACTRIM) 400-80 MG per tablet   Oral   Take 1 tablet by mouth 2 (two) times daily.   14 tablet   0    BP 136/108  Pulse 95  Temp(Src) 97.8 F (36.6 C) (Oral)  Resp 20  Ht 5\' 3"  (1.6 m)  Wt 36 lb (16.329 kg)  BMI 6.38 kg/m2  SpO2 98% Physical Exam CONSTITUTIONAL: Well developed/well nourished, pt smells of ETOH HEAD: Normocephalic/atraumatic EYES: EOMI/PERRL ENMT: Mucous membranes moist NECK: supple no meningeal signs SPINE:entire spine nontender CV: S1/S2 noted, no murmurs/rubs/gallops noted LUNGS: Lungs are clear to auscultation bilaterally, no apparent distress ABDOMEN: soft, she has wound that is dehisced with dark material surrounding  it, with tenderness noted.  The rest of her abdomen is soft to palpation GU:no cva tenderness Rectal - stool color brown, female chaperone present NEURO: Pt is awake/alert, moves all extremitiesx4 EXTREMITIES: pulses normal, full ROM SKIN: warm, color normal PSYCH: no abnormalities of mood noted  ED Course  Procedures (including critical care time) Labs Review Labs Reviewed  CBC WITH DIFFERENTIAL  BASIC METABOLIC PANEL   Imaging Review No results found.  EKG Interpretation   None     4:44 AM Pt with recent abdominal wall debridement on 02/08/13, now with wound dehiscence as well as vomiting.  Will call her surgeon for further guidance 5:39 AM Spoke to dr Lovell Sheehan about this  patient He reports he cleansed the wound in office yesterday and dark material is expected from clotted blood Currently there is clotted blood as well as some evidence of active blood There is no overlying erythema noted Labs pending at this time and will give another dose of pain meds 6:40 AM D/w dr Lovell Sheehan, he will evaluate patient and her wound She is currently stable, and no other signs of decompensation and no signs of acute GI bleed at this time  MDM  No diagnosis found. Nursing notes including past medical history and social history reviewed and considered in documentation Previous records reviewed and considered     Joya Gaskins, MD 02/19/13 [phone]

## 2013-02-19 NOTE — ED Notes (Signed)
Patient with no complaints at this time. Respirations even and unlabored. Skin warm/dry. Discharge instructions reviewed with patient at this time. Patient given opportunity to voice concerns/ask questions. IV removed per policy and band-aid applied to site. Patient discharged at this time and left Emergency Department with steady gait.  

## 2013-02-19 NOTE — ED Provider Notes (Signed)
Pt seen by dr Lovell Sheehan and cleared for d/c home and she has f/u next week Pt is comfortable with this plan  Joya Gaskins, MD 02/19/13 [phone]

## 2013-02-19 NOTE — ED Notes (Signed)
Family at bedside. Patient resting on stretcher at this time. No distress noted.

## 2013-02-19 NOTE — ED Notes (Signed)
Patient ambulated to restroom. Steady gait. States no other needs at present.

## 2013-02-19 NOTE — ED Notes (Signed)
Pt had abd surgery last week and the incision opened 2 days ago and tonight she started vomiting a coffee ground substance.

## 2013-02-26 ENCOUNTER — Encounter (HOSPITAL_COMMUNITY): Payer: Self-pay | Admitting: Emergency Medicine

## 2013-02-26 ENCOUNTER — Emergency Department (HOSPITAL_COMMUNITY)
Admission: EM | Admit: 2013-02-26 | Discharge: 2013-02-26 | Disposition: A | Discharge status: Home / Self Care | Payer: Medicare Other | Attending: Emergency Medicine | Admitting: Emergency Medicine

## 2013-02-26 DIAGNOSIS — Z8639 Personal history of other endocrine, nutritional and metabolic disease: Secondary | ICD-10-CM | POA: Insufficient documentation

## 2013-02-26 DIAGNOSIS — Z792 Long term (current) use of antibiotics: Secondary | ICD-10-CM | POA: Insufficient documentation

## 2013-02-26 DIAGNOSIS — T8131XA Disruption of external operation (surgical) wound, not elsewhere classified, initial encounter: Secondary | ICD-10-CM | POA: Diagnosis not present

## 2013-02-26 DIAGNOSIS — R197 Diarrhea, unspecified: Secondary | ICD-10-CM | POA: Insufficient documentation

## 2013-02-26 DIAGNOSIS — Z8739 Personal history of other diseases of the musculoskeletal system and connective tissue: Secondary | ICD-10-CM | POA: Diagnosis not present

## 2013-02-26 DIAGNOSIS — Y849 Medical procedure, unspecified as the cause of abnormal reaction of the patient, or of later complication, without mention of misadventure at the time of the procedure: Secondary | ICD-10-CM | POA: Insufficient documentation

## 2013-02-26 DIAGNOSIS — Z862 Personal history of diseases of the blood and blood-forming organs and certain disorders involving the immune mechanism: Secondary | ICD-10-CM | POA: Insufficient documentation

## 2013-02-26 DIAGNOSIS — R112 Nausea with vomiting, unspecified: Secondary | ICD-10-CM | POA: Diagnosis not present

## 2013-02-26 DIAGNOSIS — F17200 Nicotine dependence, unspecified, uncomplicated: Secondary | ICD-10-CM | POA: Diagnosis not present

## 2013-02-26 MED ORDER — PROMETHAZINE HCL 25 MG PO TABS: 25.0000 mg | ORAL_TABLET | Freq: Four times a day (QID) | ORAL | Status: DC | PRN

## 2013-02-26 MED ORDER — ONDANSETRON 4 MG PO TBDP
4.0000 mg | ORAL_TABLET | Freq: Once | ORAL | Status: AC
  Administered 2013-02-26: 4 mg via ORAL
  Filled 2013-02-26: qty 1

## 2013-02-26 MED ORDER — OXYCODONE-ACETAMINOPHEN 5-325 MG PO TABS: 1.0000 | ORAL_TABLET | Freq: Four times a day (QID) | ORAL | Status: DC | PRN

## 2013-02-26 MED ORDER — OXYCODONE-ACETAMINOPHEN 5-325 MG PO TABS
2.0000 | ORAL_TABLET | Freq: Once | ORAL | Status: AC
  Administered 2013-02-26: 2 via ORAL
  Filled 2013-02-26: qty 2

## 2013-02-26 MED ORDER — DOXYCYCLINE HYCLATE 100 MG PO CAPS: 100.0000 mg | ORAL_CAPSULE | Freq: Two times a day (BID) | ORAL | Status: DC

## 2013-02-26 NOTE — ED Provider Notes (Signed)
CSN: [id number]     Arrival date & time 02/26/13  1023 History   First MD Initiated Contact with Patient 02/26/13 1055     This chart was scribed for [employer]. Rubin Payor, MD by Ellin Mayhew, ED Scribe. This patient was seen in room APA01/APA01 and the patient's care was started at 11:01 AM.  Chief Complaint  Patient presents with  . Abdominal Pain   The history is provided by the patient. No language interpreter was used.   HPI Comments: Angel Price is a 47 y.o. female who presents to the Emergency Department for a an incision with worsening pain that had opened with drainage following a recent surgery. She had an abdominal wall abscess surgery on November 24th. She was seen in the ED one week ago for the incision opening. She was started on antibiotics and encouraged to F/U with her surgeon, Dr. Lovell Sheehan. She checked with her surgeon, Dr. Lovell Sheehan 3 days ago who said the incision was healing. She presents today with a new opening. She has been having nausea, vomiting, and diarrhea for two days and does not have fever. She has recently gone off antibiotics for one week. She currently takes Oxycodone for pain, but has taken it irregularly due to it causing nausea; last dose was last night.   Past Medical History  Diagnosis Date  . Lupus   . Thyroid disease    Past Surgical History  Procedure Laterality Date  . Abdominal surgery    . Abdominal hysterectomy    . Debridement of abdominal wall abscess N/A 02/08/2013    Procedure: DEBRIDEMENT OF ABDOMINAL WALL ABSCESS;  Surgeon: Dalia Heading, MD;  Location: [facility];  Service: General;  Laterality: N/A;   No family history on file. History  Substance Use Topics  . Smoking status: Current Every Day Smoker  . Smokeless tobacco: Not on file  . Alcohol Use: Yes   OB History   Grav Para Term Preterm Abortions TAB SAB Ect Mult Living                 Review of Systems  Constitutional: Negative for fever.  Gastrointestinal: Positive for  nausea, vomiting, abdominal pain and diarrhea.  Skin: Positive for wound (incision with drainage).  All other systems reviewed and are negative.    Allergies  Codeine and Morphine and related  Home Medications   Current Outpatient Rx  Name  Route  Sig  Dispense  Refill  . acetaminophen (TYLENOL) 500 MG tablet   Oral   Take 1,000 mg by mouth daily as needed for moderate pain.         . naproxen sodium (ALEVE) 220 MG tablet   Oral   Take 220 mg by mouth daily as needed (pain).         Marland Kitchen oxyCODONE-acetaminophen (PERCOCET) 7.5-325 MG per tablet   Oral   Take 2 tablets by mouth every 4 (four) hours as needed for pain.         Marland Kitchen doxycycline (VIBRAMYCIN) 100 MG capsule   Oral   Take 1 capsule (100 mg total) by mouth 2 (two) times daily.   14 capsule   0   . oxyCODONE-acetaminophen (PERCOCET/ROXICET) 5-325 MG per tablet   Oral   Take 1-2 tablets by mouth every 6 (six) hours as needed for severe pain.   15 tablet   0   . promethazine (PHENERGAN) 25 MG tablet   Oral   Take 1 tablet (25 mg total) by mouth  every 6 (six) hours as needed for nausea.   20 tablet   0    Triage Vitals: BP 132/98  Pulse 103  Temp(Src) 98.4 F (36.9 C) (Oral)  Resp 18  Ht 5\' 3"  (1.6 m)  Wt 137 lb (62.143 kg)  BMI 24.27 kg/m2  SpO2 98% Physical Exam  Nursing note and vitals reviewed. Constitutional: She is oriented to person, place, and time. She appears well-developed and well-nourished. No distress.  Appears uncomfortable.  HENT:  Head: Normocephalic and atraumatic.  Eyes: EOM are normal.  Neck: Neck supple. No tracheal deviation present.  Cardiovascular: Normal rate, regular rhythm and normal heart sounds.   Pulmonary/Chest: Effort normal and breath sounds normal. No respiratory distress.  Abdominal:  Midline scar. Superior and left lateral to the scar there is a 0.5cm wound with drainage. Inferior to the umbilicus there is a large 1cm wound/ulcer that is deep with purulent  drainage. To the right and lower to the larger wound there is a 3cm punctate hole with exposed fat, with minimal drainage. Between the large wound and the superficial wound there is induration and firmness.  Musculoskeletal: Normal range of motion.  Neurological: She is alert and oriented to person, place, and time.  Skin: Skin is warm and dry.  Psychiatric: She has a normal mood and affect. Her behavior is normal.    ED Course  Procedures (including critical care time)  Results for orders placed during the hospital encounter of 02/19/13  CBC WITH DIFFERENTIAL      Result Value Range   WBC 5.1  4.0 - 10.5 K/uL   RBC 4.50  3.87 - 5.11 MIL/uL   Hemoglobin 13.7  12.0 - 15.0 g/dL   HCT 46.9  62.9 - 52.8 %   MCV 87.6  78.0 - 100.0 fL   MCH 30.4  26.0 - 34.0 pg   MCHC 34.8  30.0 - 36.0 g/dL   RDW 41.3  24.4 - 01.0 %   Platelets 494 (*) 150 - 400 K/uL   Neutrophils Relative % 51  43 - 77 %   Neutro Abs 2.6  1.7 - 7.7 K/uL   Lymphocytes Relative 37  12 - 46 %   Lymphs Abs 1.9  0.7 - 4.0 K/uL   Monocytes Relative 11  3 - 12 %   Monocytes Absolute 0.5  0.1 - 1.0 K/uL   Eosinophils Relative 1  0 - 5 %   Eosinophils Absolute 0.1  0.0 - 0.7 K/uL   Basophils Relative 0  0 - 1 %   Basophils Absolute 0.0  0.0 - 0.1 K/uL  BASIC METABOLIC PANEL      Result Value Range   Sodium 136  135 - 145 mEq/L   Potassium 3.9  3.5 - 5.1 mEq/L   Chloride 98  96 - 112 mEq/L   CO2 22  19 - 32 mEq/L   Glucose, Bld 92  70 - 99 mg/dL   BUN 12  6 - 23 mg/dL   Creatinine, Ser 2.72  0.50 - 1.10 mg/dL   Calcium 9.6  8.4 - 53.6 mg/dL   GFR calc non Af Amer >90  >90 mL/min   GFR calc Af Amer >90  >90 mL/min  OCCULT BLOOD, POC DEVICE      Result Value Range   Fecal Occult Bld NEGATIVE  NEGATIVE   Ct Abdomen Pelvis W Contrast  02/06/2013   CLINICAL DATA:  Vomiting.  Nausea.  EXAM: CT ABDOMEN AND PELVIS WITH CONTRAST  TECHNIQUE: Multidetector CT imaging of the abdomen and pelvis was performed using the standard  protocol following bolus administration of intravenous contrast.  CONTRAST:  50mL OMNIPAQUE IOHEXOL 300 MG/ML SOLN, OMNIPAQUE IOHEXOL 300 MG/ML SOLN  COMPARISON:  None.  FINDINGS: The lung bases appear clear. No pleural or pericardial effusion. There is mild diffuse fatty infiltration of the liver. No focal fatty stress set no focal liver abnormality identified. Previous coli cystectomy. The pancreas is unremarkable. No intrahepatic bile duct dilatation. Normal appearance of the spleen.  Left adrenal nodule measures 1.9 cm in 34.6 HU, image 20/ series 2. Surgical clips are noted in the right adrenal bed. The right adrenal gland does not visualized and may be surgically absent. Normal appearance of the right kidney.  The urinary bladder appears within normal limits. Previous hysterectomy.Within the left adnexal region there is a round, 2.8 cm intermediate attenuating structure, image 73/ series 2.  Mild calcified atherosclerotic disease affects the abdominal aorta. There is no upper abdominal or pelvic adenopathy.  Postoperative changes involving the stomach are noted. A gastrojejunostomy has been performed. The small bowel loops have a normal caliber. Normal appearance of the colon.  There are multiple periumbilical, peripherally enhancing fluid collections within the ventral abdominal wall. The largest measures 3.5 x 2.4 by 2.7 cm and is left of the midline, image number 51/series 2. Eighth there may be a fistula tract communicating is worth noting that the small bowel loops are closely associated with the undersurface of the ventral abdominal wall, image number 52/series 2 and enterocutaneous fistula cannot be excluded.  Review of the visualized osseous structures is unremarkable.  IMPRESSION: 1. Examination is positive for multiple periumbilical fluid collections within the ventral abdominal wall. Cannot rule out enterocutaneous fistula.  2. Round, indeterminate intermediate attenuating structure within the  left pelvis.  3. Indeterminate left adrenal nodule. Recommend followup, non hemorrhage imaging with adrenal gland MRI.   Electronically Signed   By: Signa Kell M.D.   On: 02/06/2013 09:23    DIAGNOSTIC STUDIES: Oxygen Saturation is 98% on room air, normal by my interpretation.    COORDINATION OF CARE: 11:10 AM-Will call Dr. Lovell Sheehan to discuss treatment plan. Treatment plan discussed with patient and patient agrees.  Labs Review Labs Reviewed - No data to display Imaging Review No results found.  EKG Interpretation   None       MDM   1. Wound dehiscence, initial encounter    Patient with complaints of more pain and swelling with drainage in her abdominal wound. She had previous surgery by Dr. Lovell Sheehan. She was seen around a week ago for some dehiscence with increased drainage. She was seen in the ED by Dr. Lovell Sheehan. She followed up in the office with him and was seen in the office 3 days ago. She states now she is having more pain in that is draining from other spots. No fevers. She states she's also developed nausea vomiting diarrhea. She states she's taking less pain medicine. She states she's only taking them when she needs them but they don't work. She denies possibility of withdrawal from her pain medicines. I discussed with Dr. Lovell Sheehan. We will restart antibiotics. He'll see her in the office on Tuesday. Home health was also set up to help the patient deal with the wound care. I personally performed the services described in this documentation, which was scribed in my presence. The recorded information has been reviewed and is accurate.     Juliet Rude. Rubin Payor, MD 02/26/13  1404 

## 2013-02-26 NOTE — ED Notes (Signed)
Pt states Dr. Lovell Sheehan did surgery on her abdomen Thanksgiving. States the incision popped open and he was aware. States another area has popped open now.

## 2013-02-26 NOTE — Care Management Note (Signed)
    Page 1 of 1   02/26/2013     1:43:34 PM   CARE MANAGEMENT NOTE 02/26/2013  Patient:  Angel Price, Angel Price   Account Number:  [id number]  Date Initiated:  02/26/2013  Documentation initiated by:  Rosemary Holms  Subjective/Objective Assessment:   Pt recently had surgery and came to ER concerned about her post op wound. Pt needs HH RN for wound care and instructions     Action/Plan:   Anticipated DC Date:  02/26/2013   Anticipated DC Plan:  HOME W HOME HEALTH SERVICES      DC Planning Services  CM consult      Choice offered to / List presented to:          [facility] arranged  HH-1 RN  HH-10 DISEASE MANAGEMENT      HH agency  [facility]   Status of service:  Completed, signed off Medicare Important Message given?   (If response is "NO", the following Medicare IM given date fields will be blank) Date Medicare IM given:   Date Additional Medicare IM given:    Discharge Disposition:  HOME W HOME HEALTH SERVICES  Per UR Regulation:    If discussed at Long Length of Stay Meetings, dates discussed:    Comments:  02/26/13 Olympia Adelsberger Leanord Hawking RN BSN CM [facility], Alroy Bailiff, notified of [facility] RN referral. Best if RN can visit tomorrow.

## 2013-02-26 NOTE — Progress Notes (Signed)
CARE MANAGEMENT NOTE 02/26/2013  Patient:  Angel Price, Angel Price   Account Number:  [id number]  Date Initiated:  02/26/2013  Documentation initiated by:  Rosemary Holms  Subjective/Objective Assessment:   Pt recently had surgery and came to ER concerned about her post op wound. Pt needs HH RN for wound care and instructions     Action/Plan:   Anticipated DC Date:  02/26/2013   Anticipated DC Plan:  HOME W HOME HEALTH SERVICES      DC Planning Services  CM consult      Choice offered to / List presented to:          [facility] arranged  HH-1 RN  HH-10 DISEASE MANAGEMENT      HH agency  [facility]   Status of service:  Completed, signed off Medicare Important Message given?   (If response is "NO", the following Medicare IM given date fields will be blank) Date Medicare IM given:   Date Additional Medicare IM given:    Discharge Disposition:  HOME W HOME HEALTH SERVICES  Per UR Regulation:    If discussed at Long Length of Stay Meetings, dates discussed:    Comments:  02/26/13 Willard Madrigal Leanord Hawking RN BSN CM [facility], Alroy Bailiff, notified of [facility] RN referral. Best if RN can visit tomorrow.

## 2013-02-26 NOTE — ED Notes (Signed)
Called case worker at this time to assist pt with home health care. Angel Price

## 2013-02-27 DIAGNOSIS — L02219 Cutaneous abscess of trunk, unspecified: Secondary | ICD-10-CM | POA: Diagnosis not present

## 2013-02-27 DIAGNOSIS — T8189XA Other complications of procedures, not elsewhere classified, initial encounter: Secondary | ICD-10-CM | POA: Diagnosis not present

## 2013-02-27 DIAGNOSIS — M329 Systemic lupus erythematosus, unspecified: Secondary | ICD-10-CM | POA: Diagnosis not present

## 2013-02-27 DIAGNOSIS — T8131XA Disruption of external operation (surgical) wound, not elsewhere classified, initial encounter: Secondary | ICD-10-CM | POA: Diagnosis not present

## 2013-03-01 DIAGNOSIS — M329 Systemic lupus erythematosus, unspecified: Secondary | ICD-10-CM | POA: Diagnosis not present

## 2013-03-01 DIAGNOSIS — T8131XA Disruption of external operation (surgical) wound, not elsewhere classified, initial encounter: Secondary | ICD-10-CM | POA: Diagnosis not present

## 2013-03-01 DIAGNOSIS — L02219 Cutaneous abscess of trunk, unspecified: Secondary | ICD-10-CM | POA: Diagnosis not present

## 2013-03-01 DIAGNOSIS — T8189XA Other complications of procedures, not elsewhere classified, initial encounter: Secondary | ICD-10-CM | POA: Diagnosis not present

## 2013-03-04 DIAGNOSIS — T8189XA Other complications of procedures, not elsewhere classified, initial encounter: Secondary | ICD-10-CM | POA: Diagnosis not present

## 2013-03-04 DIAGNOSIS — L02219 Cutaneous abscess of trunk, unspecified: Secondary | ICD-10-CM | POA: Diagnosis not present

## 2013-03-04 DIAGNOSIS — T8131XA Disruption of external operation (surgical) wound, not elsewhere classified, initial encounter: Secondary | ICD-10-CM | POA: Diagnosis not present

## 2013-03-04 DIAGNOSIS — M329 Systemic lupus erythematosus, unspecified: Secondary | ICD-10-CM | POA: Diagnosis not present

## 2013-03-05 DIAGNOSIS — T8189XA Other complications of procedures, not elsewhere classified, initial encounter: Secondary | ICD-10-CM | POA: Diagnosis not present

## 2013-03-05 DIAGNOSIS — M329 Systemic lupus erythematosus, unspecified: Secondary | ICD-10-CM | POA: Diagnosis not present

## 2013-03-05 DIAGNOSIS — T8131XA Disruption of external operation (surgical) wound, not elsewhere classified, initial encounter: Secondary | ICD-10-CM | POA: Diagnosis not present

## 2013-03-05 DIAGNOSIS — L02219 Cutaneous abscess of trunk, unspecified: Secondary | ICD-10-CM | POA: Diagnosis not present

## 2013-03-08 DIAGNOSIS — T8189XA Other complications of procedures, not elsewhere classified, initial encounter: Secondary | ICD-10-CM | POA: Diagnosis not present

## 2013-03-08 DIAGNOSIS — M329 Systemic lupus erythematosus, unspecified: Secondary | ICD-10-CM | POA: Diagnosis not present

## 2013-03-08 DIAGNOSIS — L02219 Cutaneous abscess of trunk, unspecified: Secondary | ICD-10-CM | POA: Diagnosis not present

## 2013-03-08 DIAGNOSIS — T8131XA Disruption of external operation (surgical) wound, not elsewhere classified, initial encounter: Secondary | ICD-10-CM | POA: Diagnosis not present

## 2013-03-12 DIAGNOSIS — M329 Systemic lupus erythematosus, unspecified: Secondary | ICD-10-CM | POA: Diagnosis not present

## 2013-03-12 DIAGNOSIS — T8131XA Disruption of external operation (surgical) wound, not elsewhere classified, initial encounter: Secondary | ICD-10-CM | POA: Diagnosis not present

## 2013-03-12 DIAGNOSIS — T8189XA Other complications of procedures, not elsewhere classified, initial encounter: Secondary | ICD-10-CM | POA: Diagnosis not present

## 2013-03-12 DIAGNOSIS — L02219 Cutaneous abscess of trunk, unspecified: Secondary | ICD-10-CM | POA: Diagnosis not present

## 2013-03-15 DIAGNOSIS — M329 Systemic lupus erythematosus, unspecified: Secondary | ICD-10-CM | POA: Diagnosis not present

## 2013-03-15 DIAGNOSIS — T8189XA Other complications of procedures, not elsewhere classified, initial encounter: Secondary | ICD-10-CM | POA: Diagnosis not present

## 2013-03-15 DIAGNOSIS — L02219 Cutaneous abscess of trunk, unspecified: Secondary | ICD-10-CM | POA: Diagnosis not present

## 2013-03-15 DIAGNOSIS — T8131XA Disruption of external operation (surgical) wound, not elsewhere classified, initial encounter: Secondary | ICD-10-CM | POA: Diagnosis not present

## 2013-03-16 ENCOUNTER — Ambulatory Visit (INDEPENDENT_AMBULATORY_CARE_PROVIDER_SITE_OTHER): Payer: Medicare Other | Admitting: Family Medicine

## 2013-03-16 ENCOUNTER — Other Ambulatory Visit: Payer: Self-pay | Admitting: Pediatrics

## 2013-03-16 ENCOUNTER — Encounter: Payer: Self-pay | Admitting: Family Medicine

## 2013-03-16 VITALS — BP 138/98 | HR 106 | Temp 97.8°F | Resp 20 | Ht 62.0 in | Wt 133.5 lb

## 2013-03-16 DIAGNOSIS — F1010 Alcohol abuse, uncomplicated: Secondary | ICD-10-CM

## 2013-03-16 DIAGNOSIS — R030 Elevated blood-pressure reading, without diagnosis of hypertension: Secondary | ICD-10-CM

## 2013-03-16 DIAGNOSIS — Z23 Encounter for immunization: Secondary | ICD-10-CM

## 2013-03-16 DIAGNOSIS — F1411 Cocaine abuse, in remission: Secondary | ICD-10-CM

## 2013-03-16 DIAGNOSIS — H547 Unspecified visual loss: Secondary | ICD-10-CM

## 2013-03-16 DIAGNOSIS — R002 Palpitations: Secondary | ICD-10-CM | POA: Diagnosis not present

## 2013-03-16 DIAGNOSIS — E785 Hyperlipidemia, unspecified: Secondary | ICD-10-CM

## 2013-03-16 DIAGNOSIS — E039 Hypothyroidism, unspecified: Secondary | ICD-10-CM

## 2013-03-16 DIAGNOSIS — Z90710 Acquired absence of both cervix and uterus: Secondary | ICD-10-CM

## 2013-03-16 DIAGNOSIS — IMO0001 Reserved for inherently not codable concepts without codable children: Secondary | ICD-10-CM

## 2013-03-16 DIAGNOSIS — F319 Bipolar disorder, unspecified: Secondary | ICD-10-CM

## 2013-03-16 DIAGNOSIS — F609 Personality disorder, unspecified: Secondary | ICD-10-CM | POA: Insufficient documentation

## 2013-03-16 DIAGNOSIS — IMO0002 Reserved for concepts with insufficient information to code with codable children: Secondary | ICD-10-CM

## 2013-03-16 DIAGNOSIS — Z9884 Bariatric surgery status: Secondary | ICD-10-CM

## 2013-03-16 DIAGNOSIS — Z0000 Encounter for general adult medical examination without abnormal findings: Secondary | ICD-10-CM

## 2013-03-16 DIAGNOSIS — M329 Systemic lupus erythematosus, unspecified: Secondary | ICD-10-CM | POA: Diagnosis not present

## 2013-03-16 DIAGNOSIS — R7309 Other abnormal glucose: Secondary | ICD-10-CM

## 2013-03-16 DIAGNOSIS — Z720 Tobacco use: Secondary | ICD-10-CM

## 2013-03-16 DIAGNOSIS — Z8659 Personal history of other mental and behavioral disorders: Secondary | ICD-10-CM | POA: Insufficient documentation

## 2013-03-16 DIAGNOSIS — R739 Hyperglycemia, unspecified: Secondary | ICD-10-CM

## 2013-03-16 DIAGNOSIS — F17200 Nicotine dependence, unspecified, uncomplicated: Secondary | ICD-10-CM | POA: Insufficient documentation

## 2013-03-16 LAB — CBC WITH DIFFERENTIAL/PLATELET
Basophils Absolute: 0 10*3/uL (ref 0.0–0.1)
Eosinophils Relative: 1 % (ref 0–5)
Lymphocytes Relative: 34 % (ref 12–46)
Lymphs Abs: 1.8 10*3/uL (ref 0.7–4.0)
MCH: 30.6 pg (ref 26.0–34.0)
MCHC: 34 g/dL (ref 30.0–36.0)
Neutrophils Relative %: 57 % (ref 43–77)
Platelets: 315 10*3/uL (ref 150–400)
RBC: 4.41 MIL/uL (ref 3.87–5.11)
RDW: 15.6 % — ABNORMAL HIGH (ref 11.5–15.5)
WBC: 5.3 10*3/uL (ref 4.0–10.5)

## 2013-03-16 NOTE — Patient Instructions (Signed)
Health Maintenance, Female A healthy lifestyle and preventative care can promote health and wellness.  Maintain regular health, dental, and eye exams.  Eat a healthy diet. Foods like vegetables, fruits, whole grains, low-fat dairy products, and lean protein foods contain the nutrients you need without too many calories. Decrease your intake of foods high in solid fats, added sugars, and salt. Get information about a proper diet from your caregiver, if necessary.  Regular physical exercise is one of the most important things you can do for your health. Most adults should get at least 150 minutes of moderate-intensity exercise (any activity that increases your heart rate and causes you to sweat) each week. In addition, most adults need muscle-strengthening exercises on 2 or more days a week.   Maintain a healthy weight. The body mass index (BMI) is a screening tool to identify possible weight problems. It provides an estimate of body fat based on height and weight. Your caregiver can help determine your BMI, and can help you achieve or maintain a healthy weight. For adults 20 years and older:  A BMI below 18.5 is considered underweight.  A BMI of 18.5 to 24.9 is normal.  A BMI of 25 to 29.9 is considered overweight.  A BMI of 30 and above is considered obese.  Maintain normal blood lipids and cholesterol by exercising and minimizing your intake of saturated fat. Eat a balanced diet with plenty of fruits and vegetables. Blood tests for lipids and cholesterol should begin at age 20 and be repeated every 5 years. If your lipid or cholesterol levels are high, you are over 50, or you are a high risk for heart disease, you may need your cholesterol levels checked more frequently.Ongoing high lipid and cholesterol levels should be treated with medicines if diet and exercise are not effective.  If you smoke, find out from your caregiver how to quit. If you do not use tobacco, do not start.  Lung  cancer screening is recommended for adults aged 55 80 years who are at high risk for developing lung cancer because of a history of smoking. Yearly low-dose computed tomography (CT) is recommended for people who have at least a 30-pack-year history of smoking and are a current smoker or have quit within the past 15 years. A pack year of smoking is smoking an average of 1 pack of cigarettes a day for 1 year (for example: 1 pack a day for 30 years or 2 packs a day for 15 years). Yearly screening should continue until the smoker has stopped smoking for at least 15 years. Yearly screening should also be stopped for people who develop a health problem that would prevent them from having lung cancer treatment.  If you are pregnant, do not drink alcohol. If you are breastfeeding, be very cautious about drinking alcohol. If you are not pregnant and choose to drink alcohol, do not exceed 1 drink per day. One drink is considered to be 12 ounces (355 mL) of beer, 5 ounces (148 mL) of wine, or 1.5 ounces (44 mL) of liquor.  Avoid use of street drugs. Do not share needles with anyone. Ask for help if you need support or instructions about stopping the use of drugs.  High blood pressure causes heart disease and increases the risk of stroke. Blood pressure should be checked at least every 1 to 2 years. Ongoing high blood pressure should be treated with medicines, if weight loss and exercise are not effective.  If you are 55 to   45 years old, ask your caregiver if you should take aspirin to prevent strokes.  Diabetes screening involves taking a blood sample to check your fasting blood sugar level. This should be done once every 3 years, after age 45, if you are within normal weight and without risk factors for diabetes. Testing should be considered at a younger age or be carried out more frequently if you are overweight and have at least 1 risk factor for diabetes.  Breast cancer screening is essential preventative care  for women. You should practice "breast self-awareness." This means understanding the normal appearance and feel of your breasts and may include breast self-examination. Any changes detected, no matter how small, should be reported to a caregiver. Women in their 20s and 30s should have a clinical breast exam (CBE) by a caregiver as part of a regular health exam every 1 to 3 years. After age 40, women should have a CBE every year. Starting at age 40, women should consider having a mammogram (breast X-ray) every year. Women who have a family history of breast cancer should talk to their caregiver about genetic screening. Women at a high risk of breast cancer should talk to their caregiver about having an MRI and a mammogram every year.  Breast cancer gene (BRCA)-related cancer risk assessment is recommended for women who have family members with BRCA-related cancers. BRCA-related cancers include breast, ovarian, tubal, and peritoneal cancers. Having family members with these cancers may be associated with an increased risk for harmful changes (mutations) in the breast cancer genes BRCA1 and BRCA2. Results of the assessment will determine the need for genetic counseling and BRCA1 and BRCA2 testing.  The Pap test is a screening test for cervical cancer. Women should have a Pap test starting at age 21. Between ages 21 and 29, Pap tests should be repeated every 2 years. Beginning at age 30, you should have a Pap test every 3 years as long as the past 3 Pap tests have been normal. If you had a hysterectomy for a problem that was not cancer or a condition that could lead to cancer, then you no longer need Pap tests. If you are between ages 65 and 70, and you have had normal Pap tests going back 10 years, you no longer need Pap tests. If you have had past treatment for cervical cancer or a condition that could lead to cancer, you need Pap tests and screening for cancer for at least 20 years after your treatment. If Pap  tests have been discontinued, risk factors (such as a new sexual partner) need to be reassessed to determine if screening should be resumed. Some women have medical problems that increase the chance of getting cervical cancer. In these cases, your caregiver may recommend more frequent screening and Pap tests.  The human papillomavirus (HPV) test is an additional test that may be used for cervical cancer screening. The HPV test looks for the virus that can cause the cell changes on the cervix. The cells collected during the Pap test can be tested for HPV. The HPV test could be used to screen women aged 30 years and older, and should be used in women of any age who have unclear Pap test results. After the age of 30, women should have HPV testing at the same frequency as a Pap test.  Colorectal cancer can be detected and often prevented. Most routine colorectal cancer screening begins at the age of 50 and continues through age 75. However, your caregiver   may recommend screening at an earlier age if you have risk factors for colon cancer. On a yearly basis, your caregiver may provide home test kits to check for hidden blood in the stool. Use of a small camera at the end of a tube, to directly examine the colon (sigmoidoscopy or colonoscopy), can detect the earliest forms of colorectal cancer. Talk to your caregiver about this at age 50, when routine screening begins. Direct examination of the colon should be repeated every 5 to 10 years through age 75, unless early forms of pre-cancerous polyps or small growths are found.  Hepatitis C blood testing is recommended for all people born from 1945 through 1965 and any individual with known risks for hepatitis C.  Practice safe sex. Use condoms and avoid high-risk sexual practices to reduce the spread of sexually transmitted infections (STIs). Sexually active women aged 25 and younger should be checked for Chlamydia, which is a common sexually transmitted infection.  Older women with new or multiple partners should also be tested for Chlamydia. Testing for other STIs is recommended if you are sexually active and at increased risk.  Osteoporosis is a disease in which the bones lose minerals and strength with aging. This can result in serious bone fractures. The risk of osteoporosis can be identified using a bone density scan. Women ages 65 and over and women at risk for fractures or osteoporosis should discuss screening with their caregivers. Ask your caregiver whether you should be taking a calcium supplement or vitamin D to reduce the rate of osteoporosis.  Menopause can be associated with physical symptoms and risks. Hormone replacement therapy is available to decrease symptoms and risks. You should talk to your caregiver about whether hormone replacement therapy is right for you.  Use sunscreen. Apply sunscreen liberally and repeatedly throughout the day. You should seek shade when your shadow is shorter than you. Protect yourself by wearing long sleeves, pants, a wide-brimmed hat, and sunglasses year round, whenever you are outdoors.  Notify your caregiver of new moles or changes in moles, especially if there is a change in shape or color. Also notify your caregiver if a mole is larger than the size of a pencil eraser.  Stay current with your immunizations. Document Released: 09/17/2010 Document Revised: 06/29/2012 Document Reviewed: 09/17/2010 [employer] Patient Information 2014 [employer].  

## 2013-03-16 NOTE — Progress Notes (Signed)
Subjective:    Patient ID: Angel Price, female    DOB: 04-08-1965, 47 y.o.   MRN: [id number]  HPI Pt here to establish care.  She has recently been seen by Dr. Lovell Sheehan at [facility] for an abd wall abscess which was slow to heal. She had surgery and it is doing better now although she still follows with Dr. Lovell Sheehan.   She has not had a primary care doctor in the past.  Problem #1: Lupus: Patient was diagnosed with lupus in 1993. She took high doses of steroids long-term for several years. She stopped taking them 5 years ago. She occasionally gets flares and typically treats this by going to the emergency room in getting Mr. She denies any symptoms today.   Problem #2 hypothyroid: 14 years ago the patient was diagnosed with hyperthyroid, radioactive iodine and now is hypothyroid. The patient has not taken thyroid medications for several years. She has not had a TSH checked for several years ago. 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 Since then she has had 6 revisions of this bypass. She came to a lot of weight after being on long-term steroids due to the lupus. Currently she does not have any problems with the past  Problem #4 palpitations: For the past one year the patient complains of heart palpitations which occur for a few minutes up to one hour approximately every other day. She sometimes feels dizzy with them although she does not have chest pain. She denies any edema, headache, syncopal episodes  This patient has not had regular medical care at any. During her life. She has a new boyfriend now who has urged her to have a more healthy lifestyle which is why she subsided followup here. She is a long history of cocaine abuse but stopped 7 months ago when she the boyfriend. In the past she's been treated for schizoaffective disorder, bipolar disorder, and a personality disorder (she's not sure which  type). She is not on any psych medication at this time. She's been smoking for 35 years one pack per day. She drinks liquor and beer on a daily basis. She often drinks her stay in the morning and does get shaky if she does not drink during the day. She does not feel ready to quit drinking or smoking at this time. She has never had a mammogram. She had a hysterectomy in 2011 did not have any abnormal Paps before then. She has not had a TDAP. She think she remembers being told that her cholesterol blood sugars may have been high at some point in the past.   # Health maint - pap - no abn, TAH 2011 - mammo/pert FH - never had - physician breast exam - declines, never had - c-scope/pert FH - no increased risk, start at age 59 - dexa - has increased risk - will order now - BP screen - above goal. 138/98 - cholesterol screen - has not had - fasting glucose screen -  Has not had - tdap (once over age 35 in place of DT) - has not had - flu - had this year - Vit D - will check   Review of Systems A 12 point review of systems is negative except as per hpi.       Objective:   Physical Exam  Nursing note and vitals reviewed. Constitutional: She is oriented to person, place, and time.  She appears well-developed and well-nourished.  HENT:  Right Ear: External ear normal.  Left Ear: External ear normal.  Nose: Nose normal.  Mouth/Throat: Oropharynx is clear and moist. No oropharyngeal exudate. tobacco residue Eyes: Conjunctivae are normal. Pupils are equal, round, and reactive to light.  Neck: Normal range of motion. Neck supple. No thyromegaly present.  Cardiovascular: Normal rate, regular rhythm and normal heart sounds.   Pulmonary/Chest: Effort normal and breath sounds normal.  Abdominal: Soft. Bowel sounds are normal. She exhibits no distension. There is no tenderness. There is no rebound.  Lymphadenopathy:    She has no cervical adenopathy.  Neurological: She is alert and oriented to  person, place, and time. She has normal reflexes.  Skin: Skin is warm and dry.  Psychiatric: She has a normal mood and affect. Her behavior is normal.        Assessment & Plan:  Vincent was seen today for new patient.  Diagnoses and associated orders for this visit:  History of cocaine abuse: She says she has quit drugs. I told her that if she returns to the cocaine or glycol quitting to let us know or happy to assist her in finding appropriate for this.  History of schizoaffective disorder I discussed with her that I am not comfortable managing these psychiatric diagnoses. The patient is competent she does not have any symptoms of them at this time. She says she will let me know if she develops any symptoms or if her boyfriend notices that she is developing any symptoms. At that she would be amenable to seeing psychiatry. At this time she does not feel the need to see psychiatry.  Bipolar disorder, unspecified  Personality disorder  Tobacco abuse smoking cessation provided. Patient precontemplative at this time  Alcohol abuse cessation counseling provided patient precontemplative about discontinuing alcohol at this time as well she does say that she does not report other people her risk when she drinks and does not ever drive under the alcohol and fluent  Palpitations - EKG 12-Lead - Holter monitor - 48 hour; Future - CBC with Differential - Comprehensive metabolic panel We'll consider cardiology consult  Poor vision Continue to follow with ophthalmology as she has been to a History of gastric bypass Follow with surgery as needed Status post hysterectomy with oophorectomy Patient no longer needs Pap smears however she should get an annual pelvic exam. She declines today and so she'll do it another day when she is "prepared" Hypothyroid - T4, free - TSH - T3, Free  Lupus - Hemoglobin A1c  The patient has a lupus flare she will be seen in our office. I told her she probably  does need to emergency room for this she has a primary care doctor the Health maintenance examination - MM Digital Screening; Future - Tdap vaccine greater than or equal to 7yo IM  Steroid long-term use  Elevated blood pressure - T4, free - TSH - LDL cholesterol, direct - T3, Free If blood pressure continues to be above goal at her next visit we'll consider starting her on the medication. Other and unspecified hyperlipidemia - LDL cholesterol, direct If LDL elevated will check a fasting lipid profile. She has eaten this morning. Elevated blood sugar - Hemoglobin A1c

## 2013-03-17 ENCOUNTER — Telehealth: Payer: Self-pay | Admitting: *Deleted

## 2013-03-17 ENCOUNTER — Other Ambulatory Visit: Payer: Self-pay | Admitting: Family Medicine

## 2013-03-17 DIAGNOSIS — L02219 Cutaneous abscess of trunk, unspecified: Secondary | ICD-10-CM | POA: Diagnosis not present

## 2013-03-17 DIAGNOSIS — M329 Systemic lupus erythematosus, unspecified: Secondary | ICD-10-CM | POA: Diagnosis not present

## 2013-03-17 DIAGNOSIS — T8189XA Other complications of procedures, not elsewhere classified, initial encounter: Secondary | ICD-10-CM | POA: Diagnosis not present

## 2013-03-17 DIAGNOSIS — E559 Vitamin D deficiency, unspecified: Secondary | ICD-10-CM

## 2013-03-17 DIAGNOSIS — T8131XA Disruption of external operation (surgical) wound, not elsewhere classified, initial encounter: Secondary | ICD-10-CM | POA: Diagnosis not present

## 2013-03-17 LAB — COMPREHENSIVE METABOLIC PANEL
ALT: 17 U/L (ref 0–35)
AST: 30 U/L (ref 0–37)
Albumin: 4.5 g/dL (ref 3.5–5.2)
CO2: 25 mEq/L (ref 19–32)
Chloride: 101 mEq/L (ref 96–112)
Creat: 0.59 mg/dL (ref 0.50–1.10)
Sodium: 138 mEq/L (ref 135–145)
Total Bilirubin: 0.8 mg/dL (ref 0.3–1.2)
Total Protein: 7.6 g/dL (ref 6.0–8.3)

## 2013-03-17 LAB — LDL CHOLESTEROL, DIRECT: Direct LDL: 129 mg/dL — ABNORMAL HIGH

## 2013-03-17 LAB — HEMOGLOBIN A1C: Hgb A1c MFr Bld: 5 % (ref <5.7)

## 2013-03-17 LAB — VITAMIN D 25 HYDROXY (VIT D DEFICIENCY, FRACTURES): Vit D, 25-Hydroxy: 11 ng/mL — ABNORMAL LOW (ref 30–89)

## 2013-03-17 LAB — TSH: TSH: 0.616 u[IU]/mL (ref 0.350–4.500)

## 2013-03-17 MED ORDER — CHOLECALCIFEROL 1.25 MG (50000 UT) PO CAPS: 50000.0000 [IU] | ORAL_CAPSULE | Freq: Every day | ORAL | Status: DC

## 2013-03-17 NOTE — Progress Notes (Signed)
See telephone encounter.

## 2013-03-17 NOTE — Telephone Encounter (Signed)
Message copied by [facility], Bonnell Public on Wed Mar 17, 2013  1:56 PM ------      Message from: Acey Lav      Created: Wed Mar 17, 2013  8:52 AM       Please let pt know Im sending her in vit D supplement bc level quite low. Recheck in a couple mos - i'll put the future order in and she can go to the lab at her convenience in 8-10 weeks. Thanks AW ------

## 2013-03-17 NOTE — Telephone Encounter (Signed)
Pt returned call and was notified of all lab results. Informed her that all labs were wnl except vitamin D and that MD had sent a supplement to pharmacy for pt to take and to have lab redrawn at her convenience in 2 months. Pt appreciative and understanding.

## 2013-03-17 NOTE — Telephone Encounter (Signed)
Nurse called to give pt lab results, no answer, message left for callback.

## 2013-03-17 NOTE — Telephone Encounter (Signed)
Opened on accident

## 2013-03-23 ENCOUNTER — Ambulatory Visit: Payer: Medicare Other | Admitting: Family Medicine

## 2013-03-25 DIAGNOSIS — T8131XA Disruption of external operation (surgical) wound, not elsewhere classified, initial encounter: Secondary | ICD-10-CM | POA: Diagnosis not present

## 2013-03-25 DIAGNOSIS — M329 Systemic lupus erythematosus, unspecified: Secondary | ICD-10-CM | POA: Diagnosis not present

## 2013-03-25 DIAGNOSIS — L02219 Cutaneous abscess of trunk, unspecified: Secondary | ICD-10-CM | POA: Diagnosis not present

## 2013-03-25 DIAGNOSIS — T8189XA Other complications of procedures, not elsewhere classified, initial encounter: Secondary | ICD-10-CM | POA: Diagnosis not present

## 2013-03-30 ENCOUNTER — Ambulatory Visit (INDEPENDENT_AMBULATORY_CARE_PROVIDER_SITE_OTHER): Payer: Medicare Other | Admitting: Family Medicine

## 2013-03-30 ENCOUNTER — Encounter: Payer: Self-pay | Admitting: Family Medicine

## 2013-03-30 VITALS — BP 136/100 | HR 102 | Temp 97.6°F | Resp 18 | Ht 62.5 in | Wt 132.5 lb

## 2013-03-30 DIAGNOSIS — R002 Palpitations: Secondary | ICD-10-CM

## 2013-03-30 DIAGNOSIS — R519 Headache, unspecified: Secondary | ICD-10-CM | POA: Insufficient documentation

## 2013-03-30 DIAGNOSIS — F1411 Cocaine abuse, in remission: Secondary | ICD-10-CM

## 2013-03-30 DIAGNOSIS — I10 Essential (primary) hypertension: Secondary | ICD-10-CM | POA: Insufficient documentation

## 2013-03-30 DIAGNOSIS — G479 Sleep disorder, unspecified: Secondary | ICD-10-CM | POA: Insufficient documentation

## 2013-03-30 DIAGNOSIS — F17200 Nicotine dependence, unspecified, uncomplicated: Secondary | ICD-10-CM

## 2013-03-30 DIAGNOSIS — R51 Headache: Secondary | ICD-10-CM

## 2013-03-30 DIAGNOSIS — F1010 Alcohol abuse, uncomplicated: Secondary | ICD-10-CM

## 2013-03-30 DIAGNOSIS — Z720 Tobacco use: Secondary | ICD-10-CM

## 2013-03-30 MED ORDER — LISINOPRIL 10 MG PO TABS: 10.0000 mg | ORAL_TABLET | Freq: Every day | ORAL | Status: DC

## 2013-03-30 NOTE — Patient Instructions (Signed)
Melatonin oral capsules and tablets What is this medicine? MELATONIN (mel uh TOH nin) is a dietary supplement. It is promoted to help maintain normal sleep patterns. The FDA has not approved this supplement for any medical use. This supplement may be used for other purposes; ask your health care provider or pharmacist if you have questions. This medicine may be used for other purposes; ask your health care provider or pharmacist if you have questions. COMMON BRAND NAME(S): Melatonex What should I tell my health care provider before I take this medicine? They need to know if you have any of these conditions: -cancer -if you frequently drink alcohol containing drinks -immune system problems -liver disease -seizure disorder -an unusual or allergic reaction to melatonin, other medicines, foods, dyes, or preservatives -pregnant or trying to get pregnant -breast-feeding How should I use this medicine? Take this supplement by mouth with a glass of water. This supplement is usually taken prior to bedtime. Do not chew or crush most tablets or capsules. Some tablets are chewable and are chewed before swallowing. Some tablets are meant to be dissolved in the mouth or under the tongue. Follow the directions on the package labeling, or take as directed by your health care professional. Do not take this supplement more often than directed. Talk to your pediatrician regarding the use of this supplement in children. This supplement is not recommended for use in children. Overdosage: If you think you have taken too much of this medicine contact a poison control center or emergency room at once. NOTE: This medicine is only for you. Do not share this medicine with others. What if I miss a dose? This does not apply; this medicine is not for regular use. Do not take double or extra doses. What may interact with this medicine? Check with your doctor or healthcare professional if you are taking any of the following  medications: -hormone medicines -medicines for blood pressure like nifedipine -medications for anxiety, depression, or other emotional or psychiatric problems -medications for seizures -medications for sleep -other herbal or dietary supplements -stimulant medicines like amphetamine, dextroamphetamine -tamoxifen -treatments for cancer or immune disorders This list may not describe all possible interactions. Give your health care provider a list of all the medicines, herbs, non-prescription drugs, or dietary supplements you use. Also tell them if you smoke, drink alcohol, or use illegal drugs. Some items may interact with your medicine. What should I watch for while using this medicine? See your doctor if your symptoms do not get better or if they get worse. Do not take this supplement for more than 2 weeks unless your doctor tells you to. You may get drowsy or dizzy. Do not drive, use machinery, or do anything that needs mental alertness until you know how this medicine affects you. Do not stand or sit up quickly, especially if you are an older patient. This reduces the risk of dizzy or fainting spells. Alcohol may interfere with the effect of this medicine. Avoid alcoholic drinks. Talk to your doctor before you use this supplement if you are currently being treated for an emotional, mental, or sleep problem. This medicine may interfere with your treatment. Herbal or dietary supplements are not regulated like medicines. Rigid quality control standards are not required for dietary supplements. The purity and strength of these products can vary. The safety and effect of this dietary supplement for a certain disease or illness is not well known. This product is not intended to diagnose, treat, cure or prevent any disease.  The Food and Drug Administration suggests the following to help consumers protect themselves: -Always read product labels and follow directions. -Natural does not mean a product is  safe for humans to take. -Look for products that include USP after the ingredient name. This means that the manufacturer followed the standards of the [place] Pharmacopoeia. -Supplements made or sold by a nationally known food or drug company are more likely to be made under tight controls. You can write to the company for more information about how the product was made. What side effects may I notice from receiving this medicine? Side effects that you should report to your doctor or health care professional as soon as possible: -allergic reactions like skin rash, itching or hives, swelling of the face, lips, or tongue -breathing problems -confusion, forgetful -depressed, nervous, or other mood changes -fast or pounding heartbeat -trouble staying awake or alert during the day Side effects that usually do not require medical attention (report to your doctor or health care professional if they continue or are bothersome): -drowsiness, dizziness -headache -nightmares -upset stomach This list may not describe all possible side effects. Call your doctor for medical advice about side effects. You may report side effects to FDA at 1-800-FDA-1088. Where should I keep my medicine? Keep out of the reach of children. Store at room temperature or as directed on the package label. Protect from moisture. Throw away any unused supplement after the expiration date. NOTE: This sheet is a summary. It may not cover all possible information. If you have questions about this medicine, talk to your doctor, pharmacist, or health care provider.  2014, Elsevier/Gold Standard. (2012-07-13 10:51:55) Hypertension As your heart beats, it forces blood through your arteries. This force is your blood pressure. If the pressure is too high, it is called hypertension (HTN) or high blood pressure. HTN is dangerous because you may have it and not know it. High blood pressure may mean that your heart has to work harder to pump blood.  Your arteries may be narrow or stiff. The extra work puts you at risk for heart disease, stroke, and other problems.  Blood pressure consists of two numbers, a higher number over a lower, 110/72, for example. It is stated as "110 over 72." The ideal is below 120 for the top number (systolic) and under 80 for the bottom (diastolic). Write down your blood pressure today. You should pay close attention to your blood pressure if you have certain conditions such as:  Heart failure.  Prior heart attack.  Diabetes  Chronic kidney disease.  Prior stroke.  Multiple risk factors for heart disease. To see if you have HTN, your blood pressure should be measured while you are seated with your arm held at the level of the heart. It should be measured at least twice. A one-time elevated blood pressure reading (especially in the Emergency Department) does not mean that you need treatment. There may be conditions in which the blood pressure is different between your right and left arms. It is important to see your caregiver soon for a recheck. Most people have essential hypertension which means that there is not a specific cause. This type of high blood pressure may be lowered by changing lifestyle factors such as:  Stress.  Smoking.  Lack of exercise.  Excessive weight.  Drug/tobacco/alcohol use.  Eating less salt. Most people do not have symptoms from high blood pressure until it has caused damage to the body. Effective treatment can often prevent, delay or reduce  that damage. TREATMENT  When a cause has been identified, treatment for high blood pressure is directed at the cause. There are a large number of medications to treat HTN. These fall into several categories, and your caregiver will help you select the medicines that are best for you. Medications may have side effects. You should review side effects with your caregiver. If your blood pressure stays high after you have made lifestyle changes  or started on medicines,   Your medication(s) may need to be changed.  Other problems may need to be addressed.  Be certain you understand your prescriptions, and know how and when to take your medicine.  Be sure to follow up with your caregiver within the time frame advised (usually within two weeks) to have your blood pressure rechecked and to review your medications.  If you are taking more than one medicine to lower your blood pressure, make sure you know how and at what times they should be taken. Taking two medicines at the same time can result in blood pressure that is too low. SEEK IMMEDIATE MEDICAL CARE IF:  You develop a severe headache, blurred or changing vision, or confusion.  You have unusual weakness or numbness, or a faint feeling.  You have severe chest or abdominal pain, vomiting, or breathing problems. MAKE SURE YOU:   Understand these instructions.  Will watch your condition.  Will get help right away if you are not doing well or get worse. Document Released: 03/04/2005 Document Revised: 05/27/2011 Document Reviewed: 10/23/2007 [facility] Patient Information 2014 [place].

## 2013-03-30 NOTE — Progress Notes (Signed)
Subjective:    Patient ID: Angel Price, female    DOB: [birth year], 48 y.o.   MRN: [id number]  HPI Is here today in followup. She reports doing well for the last 3 months.   She did not get her Holter monitor or her mammogram done because she did not have to get him set up. She says she does still get palpitations from time to time but denies dizziness or lightheadedness with these. She also denies chest pain.  She declines her pelvic today do to vaginal soreness due to intercourse.   She does complain of headaches which have been worse over the past week. This is the same time that she's had an upper respiratory tract infection. Due to the URI she has been smoking lasts (half a pack as opposed to a full pack). She also has been drinking less alcohol although couldn't quantify this for me. She denies facial pain, fevers or bloody discharge from her nose. She describes the headache as a dull ache from her head which improves actually with walker. She drinks very minimal caffeine at baseline  She also complains of sleep difficulties. She is able to fall asleep but wakes up in our 2 after falling sleep. This started after the death of her son in the last few months and she wakes up with racing thoughts about her son and his death. She has tried a couple over-the-counter sleep aids but feels "hung over" in the morning.  Review of Systems A 12 point review of systems is negative except as per hpi.       Objective:   Physical Exam Nursing note and vitals reviewed. Constitutional: She is oriented to person, place, and time. She appears well-developed and well-nourished.  HENT:  Right Ear: External ear normal.  Left Ear: External ear normal.  Nose: Nose normal.  Mouth/Throat: Oropharynx is clear and moist. No oropharyngeal exudate.  Eyes: Conjunctivae are normal. Pupils are equal, round, and reactive to light.  Neck: Normal range of motion. Neck supple. No thyromegaly present.    Cardiovascular: Normal rate, regular rhythm and normal heart sounds.   Pulmonary/Chest: Effort normal and breath sounds normal.  Abdominal: Soft. Bowel sounds are normal. She exhibits no distension. There is no tenderness. There is no rebound.  Lymphadenopathy:    She has no cervical adenopathy.  Neurological: She is alert and oriented to person, place, and time. She has normal reflexes.  Skin: Skin is warm and dry.  Psychiatric: She has a normal mood and affect. Her behavior is normal.         Assessment & Plan:  Keylen was seen today for follow-up.  Diagnoses and associated orders for this visit:  Essential hypertension, benign - lisinopril (PRINIVIL) 10 MG tablet; Take 1 tablet (10 mg total) by mouth daily. - Basic metabolic panel; Future Her blood pressures have been elevated her last couple of times she's been here including today. It's possible that this is contributing to her headache. We'll start her on lisinopril and see how her blood pressures and symptoms do on. I've asked her to get a BMP checked in about 2 weeks. Alcohol abuse  History of cocaine abuse The patient says that she has not used cocaine in almost a year. I do worry though with her history of polysubstance abuse and the death of her son that she may be self-medicating some of these substances. In the setting of starting blood pressure medicine I'm hesitant to start a beta blocker given her  history of cocaine abuse. Palpitations We'll work with her in getting a Holter monitor set up an I've asked her to get this done in the next couple of weeks so we can discuss the results. I prefer to avoid a beta blocker if possible given history of cocaine abuse Tobacco abuse Counseled for approximately 5 minutes on smoking cessation today. She continues to be pre-contemplative regarding cessation however does admit that if she were to go to stay her current half-pack a day would be better than return to her full pack a  day. Frequent headaches We'll see how these headaches do on the blood pressure medicine and once her URI resolves. We discussed again that if they worsen or she develops neurological symptoms she'll amenable Sleep difficulties I suggested that she try melatonin for her sleep issues. I have offered to refer her to counseling but she declines at this time. I am concerned that she may be using alcohol to help her sleep and to help her deal with the death of her son. She does not think this is the case but I think it's important to keep an eye on.  We'll plan to see this patient back in about 4 weeks, earlier if indicated. We've discussed red flags for her to be seen in the emergency department. Call with any questions

## 2013-04-02 DIAGNOSIS — T8131XA Disruption of external operation (surgical) wound, not elsewhere classified, initial encounter: Secondary | ICD-10-CM | POA: Diagnosis not present

## 2013-04-02 DIAGNOSIS — M329 Systemic lupus erythematosus, unspecified: Secondary | ICD-10-CM | POA: Diagnosis not present

## 2013-04-02 DIAGNOSIS — L02219 Cutaneous abscess of trunk, unspecified: Secondary | ICD-10-CM | POA: Diagnosis not present

## 2013-04-02 DIAGNOSIS — L03319 Cellulitis of trunk, unspecified: Secondary | ICD-10-CM | POA: Diagnosis not present

## 2013-04-02 DIAGNOSIS — T8189XA Other complications of procedures, not elsewhere classified, initial encounter: Secondary | ICD-10-CM | POA: Diagnosis not present

## 2013-04-05 ENCOUNTER — Ambulatory Visit (HOSPITAL_COMMUNITY)
Admission: RE | Admit: 2013-04-05 | Discharge: 2013-04-05 | Disposition: A | Discharge status: Home / Self Care | Payer: Medicare Other | Source: Ambulatory Visit | Attending: Family Medicine | Admitting: Family Medicine

## 2013-04-05 DIAGNOSIS — R002 Palpitations: Secondary | ICD-10-CM

## 2013-04-05 DIAGNOSIS — Z1231 Encounter for screening mammogram for malignant neoplasm of breast: Secondary | ICD-10-CM | POA: Diagnosis not present

## 2013-04-05 DIAGNOSIS — Z0000 Encounter for general adult medical examination without abnormal findings: Secondary | ICD-10-CM

## 2013-04-05 NOTE — Progress Notes (Signed)
48 hour Holter in progress.

## 2013-04-08 DIAGNOSIS — T8131XA Disruption of external operation (surgical) wound, not elsewhere classified, initial encounter: Secondary | ICD-10-CM | POA: Diagnosis not present

## 2013-04-08 DIAGNOSIS — L03319 Cellulitis of trunk, unspecified: Secondary | ICD-10-CM | POA: Diagnosis not present

## 2013-04-08 DIAGNOSIS — M329 Systemic lupus erythematosus, unspecified: Secondary | ICD-10-CM | POA: Diagnosis not present

## 2013-04-08 DIAGNOSIS — L02219 Cutaneous abscess of trunk, unspecified: Secondary | ICD-10-CM | POA: Diagnosis not present

## 2013-04-08 DIAGNOSIS — T8189XA Other complications of procedures, not elsewhere classified, initial encounter: Secondary | ICD-10-CM | POA: Diagnosis not present

## 2013-04-10 ENCOUNTER — Encounter (HOSPITAL_COMMUNITY): Payer: Self-pay | Admitting: Emergency Medicine

## 2013-04-10 ENCOUNTER — Emergency Department (HOSPITAL_COMMUNITY)
Admission: EM | Admit: 2013-04-10 | Discharge: 2013-04-10 | Disposition: A | Discharge status: Home / Self Care | Payer: Medicare Other | Attending: Emergency Medicine | Admitting: Emergency Medicine

## 2013-04-10 DIAGNOSIS — I10 Essential (primary) hypertension: Secondary | ICD-10-CM | POA: Diagnosis not present

## 2013-04-10 DIAGNOSIS — R110 Nausea: Secondary | ICD-10-CM | POA: Diagnosis not present

## 2013-04-10 DIAGNOSIS — F17200 Nicotine dependence, unspecified, uncomplicated: Secondary | ICD-10-CM | POA: Insufficient documentation

## 2013-04-10 DIAGNOSIS — K625 Hemorrhage of anus and rectum: Secondary | ICD-10-CM | POA: Insufficient documentation

## 2013-04-10 DIAGNOSIS — Z8639 Personal history of other endocrine, nutritional and metabolic disease: Secondary | ICD-10-CM | POA: Insufficient documentation

## 2013-04-10 DIAGNOSIS — Z90710 Acquired absence of both cervix and uterus: Secondary | ICD-10-CM | POA: Diagnosis not present

## 2013-04-10 DIAGNOSIS — Z9889 Other specified postprocedural states: Secondary | ICD-10-CM | POA: Diagnosis not present

## 2013-04-10 DIAGNOSIS — Z8739 Personal history of other diseases of the musculoskeletal system and connective tissue: Secondary | ICD-10-CM | POA: Insufficient documentation

## 2013-04-10 DIAGNOSIS — Z862 Personal history of diseases of the blood and blood-forming organs and certain disorders involving the immune mechanism: Secondary | ICD-10-CM | POA: Insufficient documentation

## 2013-04-10 DIAGNOSIS — Z9884 Bariatric surgery status: Secondary | ICD-10-CM | POA: Diagnosis not present

## 2013-04-10 DIAGNOSIS — Z79899 Other long term (current) drug therapy: Secondary | ICD-10-CM | POA: Insufficient documentation

## 2013-04-10 HISTORY — DX: Essential (primary) hypertension: I10

## 2013-04-10 LAB — BASIC METABOLIC PANEL
BUN: 11 mg/dL (ref 6–23)
CO2: 23 mEq/L (ref 19–32)
Calcium: 9.5 mg/dL (ref 8.4–10.5)
Chloride: 99 mEq/L (ref 96–112)
Creatinine, Ser: 0.58 mg/dL (ref 0.50–1.10)
Glucose, Bld: 80 mg/dL (ref 70–99)
Potassium: 4.4 mEq/L (ref 3.7–5.3)
Sodium: 136 mEq/L — ABNORMAL LOW (ref 137–147)

## 2013-04-10 LAB — CBC WITH DIFFERENTIAL/PLATELET
BASOS PCT: 0 % (ref 0–1)
Basophils Absolute: 0 10*3/uL (ref 0.0–0.1)
EOS ABS: 0 10*3/uL (ref 0.0–0.7)
EOS PCT: 1 % (ref 0–5)
HCT: 36 % (ref 36.0–46.0)
HEMOGLOBIN: 12.8 g/dL (ref 12.0–15.0)
Lymphocytes Relative: 36 % (ref 12–46)
Lymphs Abs: 1.6 10*3/uL (ref 0.7–4.0)
MCH: 31.1 pg (ref 26.0–34.0)
MCHC: 35.6 g/dL (ref 30.0–36.0)
MCV: 87.6 fL (ref 78.0–100.0)
Monocytes Absolute: 0.5 10*3/uL (ref 0.1–1.0)
Monocytes Relative: 11 % (ref 3–12)
Neutro Abs: 2.2 10*3/uL (ref 1.7–7.7)
Neutrophils Relative %: 53 % (ref 43–77)
Platelets: 207 10*3/uL (ref 150–400)
RBC: 4.11 MIL/uL (ref 3.87–5.11)
RDW: 14.9 % (ref 11.5–15.5)
WBC: 4.3 10*3/uL (ref 4.0–10.5)

## 2013-04-10 LAB — OCCULT BLOOD, POC DEVICE: Fecal Occult Bld: POSITIVE — AB

## 2013-04-10 MED ORDER — SENNOSIDES-DOCUSATE SODIUM 8.6-50 MG PO TABS: 2.0000 | ORAL_TABLET | Freq: Every day | ORAL | Status: DC

## 2013-04-10 NOTE — ED Notes (Signed)
Pt reports bright red blood in stool this morning x2. Pt also reports abdominal cramping.

## 2013-04-10 NOTE — ED Notes (Signed)
Patient with no complaints at this time. Respirations even and unlabored. Skin warm/dry. Discharge instructions reviewed with patient at this time. Patient given opportunity to voice concerns/ask questions. Patient discharged at this time and left Emergency Department with steady gait.   

## 2013-04-10 NOTE — ED Provider Notes (Signed)
CSN: [id number]     Arrival date & time 04/10/13  5053 History   First MD Initiated Contact with Patient 04/10/13 [phone]     Chief Complaint  Patient presents with  . Rectal Bleeding   (Consider location/radiation/quality/duration/timing/severity/associated sxs/prior Treatment) Patient is a 48 y.o. female presenting with hematochezia. The history is provided by the patient.  Rectal Bleeding Quality:  Bright red Amount:  Scant Duration:  2 hours Timing:  Intermittent Progression:  Resolved Chronicity:  New Context: defecation   Context: not anal fissures, not diarrhea, not hemorrhoids, not rectal injury and not rectal pain   Similar prior episodes: no   Relieved by:  Time Worsened by:  Defecation Ineffective treatments:  None tried Associated symptoms: no dizziness, no epistaxis, no fever, no hematemesis, no light-headedness, no loss of consciousness, no recent illness and no vomiting   Associated symptoms comment:  Abdominal cramping, but patient states this is chronic since abdominal surgery in November. Risk factors: no anticoagulant use, no hx of colorectal cancer, no hx of colorectal surgery and no NSAID use     Past Medical History  Diagnosis Date  . Lupus   . Thyroid disease   . Hypertension    Past Surgical History  Procedure Laterality Date  . Abdominal surgery    . Abdominal hysterectomy    . Debridement of abdominal wall abscess N/A 02/08/2013    Procedure: DEBRIDEMENT OF ABDOMINAL WALL ABSCESS;  Surgeon: Jamesetta So, MD;  Location: [facility];  Service: General;  Laterality: N/A;   Family History  Problem Relation Age of Onset  . Cancer Son   . Cancer Maternal Aunt   . Cancer Maternal Grandmother    History  Substance Use Topics  . Smoking status: Current Every Day Smoker -- 1.00 packs/day    Types: Cigarettes  . Smokeless tobacco: Never Used  . Alcohol Use: Yes     Comment: beer or liquor daily until "buzz" at least. often in morning.    OB History   Grav Para Term Preterm Abortions TAB SAB Ect Mult Living                 Review of Systems  Constitutional: Negative for fever, chills and appetite change.  HENT: Negative for nosebleeds.   Respiratory: Negative for shortness of breath.   Cardiovascular: Negative for chest pain.  Gastrointestinal: Positive for nausea, hematochezia and anal bleeding. Negative for vomiting, blood in stool, abdominal distention, rectal pain and hematemesis.       Abdominal cramping  Genitourinary: Negative for dysuria, flank pain, decreased urine volume and difficulty urinating.  Musculoskeletal: Negative for back pain.  Skin: Negative for color change and rash.  Neurological: Negative for dizziness, loss of consciousness, syncope, weakness, light-headedness, numbness and headaches.  Hematological: Negative for adenopathy.  All other systems reviewed and are negative.    Allergies  Codeine and Morphine and related  Home Medications   Current Outpatient Rx  Name  Route  Sig  Dispense  Refill  . lisinopril (PRINIVIL) 10 MG tablet   Oral   Take 1 tablet (10 mg total) by mouth daily.   30 tablet   1   . Vitamin D, Ergocalciferol, (DRISDOL) 50000 UNITS CAPS capsule   Oral   Take 50,000 Units by mouth 2 (two) times a week. Takes on Wednesday and Saturday.          BP 154/103  Pulse 94  Temp(Src) 98.3 F (36.8 C) (Oral)  Resp 20  SpO2 98%  Physical Exam  Nursing note and vitals reviewed. Constitutional: She is oriented to person, place, and time. She appears well-developed and well-nourished. No distress.  HENT:  Head: Normocephalic and atraumatic.  Mouth/Throat: Oropharynx is clear and moist.  Cardiovascular: Normal rate, regular rhythm, normal heart sounds and intact distal pulses.   No murmur heard. Pulmonary/Chest: Effort normal and breath sounds normal. No respiratory distress.  Abdominal: Soft. Bowel sounds are normal. She exhibits no distension and no mass. There is no tenderness.  There is no rebound and no guarding.  Healing surgical incision just distal to the umbilicus.  No surrounding erythema or edema.  Normal BS x 4 quadrants  Genitourinary: Rectal exam shows no external hemorrhoid, no internal hemorrhoid, no fissure, no mass, no tenderness and anal tone normal. Guaiac positive stool.  Gross blood on rectal exam.  No stool.  Rectal tone nml. No palpable rectal masses   Musculoskeletal: Normal range of motion. She exhibits no edema.  Neurological: She is alert and oriented to person, place, and time. She exhibits normal muscle tone. Coordination normal.  Skin: Skin is warm and dry.    ED Course  Procedures (including critical care time) Labs Review Labs Reviewed  BASIC METABOLIC PANEL - Abnormal; Notable for the following:    Sodium 136 (*)    All other components within normal limits  OCCULT BLOOD, POC DEVICE - Abnormal; Notable for the following:    Fecal Occult Bld POSITIVE (*)    All other components within normal limits  CBC WITH DIFFERENTIAL   Imaging Review No results found.  EKG Interpretation   None       MDM   Charts reviewed.  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  Appears to be healing well w/o symptoms of acute infectious process.  Patient is well appearing, VSS.  No hx of vomiting , fever or weakness.  Has bright red blood PR likely secondary to straining during defecation.  Pt agrees to close f/u with her PMD, fluids, and stool softener.  Ambulates with a steady gait.  Appears stable for d/c  Kemyra August L. Shanetra Blumenstock, PA-C 04/11/13 [id number]

## 2013-04-10 NOTE — ED Provider Notes (Signed)
Medical screening examination/treatment/procedure(s) were performed by non-physician practitioner and as supervising physician I was immediately available for consultation/collaboration.  EKG Interpretation   None        Babette Relic, MD 04/10/13 [phone]

## 2013-04-12 ENCOUNTER — Encounter (HOSPITAL_COMMUNITY): Payer: Self-pay | Admitting: Emergency Medicine

## 2013-04-12 ENCOUNTER — Emergency Department (HOSPITAL_COMMUNITY)
Admission: EM | Admit: 2013-04-12 | Discharge: 2013-04-12 | Disposition: A | Discharge status: Home / Self Care | Payer: Medicare Other | Attending: Emergency Medicine | Admitting: Emergency Medicine

## 2013-04-12 ENCOUNTER — Telehealth: Payer: Self-pay | Admitting: *Deleted

## 2013-04-12 DIAGNOSIS — Z8639 Personal history of other endocrine, nutritional and metabolic disease: Secondary | ICD-10-CM | POA: Diagnosis not present

## 2013-04-12 DIAGNOSIS — Z862 Personal history of diseases of the blood and blood-forming organs and certain disorders involving the immune mechanism: Secondary | ICD-10-CM | POA: Diagnosis not present

## 2013-04-12 DIAGNOSIS — K625 Hemorrhage of anus and rectum: Secondary | ICD-10-CM | POA: Diagnosis not present

## 2013-04-12 DIAGNOSIS — F17200 Nicotine dependence, unspecified, uncomplicated: Secondary | ICD-10-CM | POA: Diagnosis not present

## 2013-04-12 DIAGNOSIS — Z8739 Personal history of other diseases of the musculoskeletal system and connective tissue: Secondary | ICD-10-CM | POA: Insufficient documentation

## 2013-04-12 DIAGNOSIS — Z79899 Other long term (current) drug therapy: Secondary | ICD-10-CM | POA: Insufficient documentation

## 2013-04-12 DIAGNOSIS — I10 Essential (primary) hypertension: Secondary | ICD-10-CM | POA: Diagnosis not present

## 2013-04-12 LAB — BASIC METABOLIC PANEL
BUN: 11 mg/dL (ref 6–23)
CO2: 23 mEq/L (ref 19–32)
Calcium: 9.6 mg/dL (ref 8.4–10.5)
Chloride: 98 mEq/L (ref 96–112)
Creatinine, Ser: 0.72 mg/dL (ref 0.50–1.10)
GFR calc Af Amer: >90 mL/min (ref >90)
GFR calc non Af Amer: >90 mL/min (ref >90)
Glucose, Bld: 85 mg/dL (ref 70–99)
Potassium: 4.2 mEq/L (ref 3.7–5.3)
Sodium: 137 mEq/L (ref 137–147)

## 2013-04-12 LAB — CBC WITH DIFFERENTIAL/PLATELET
Basophils Absolute: 0 10*3/uL (ref 0.0–0.1)
Basophils Relative: 0 % (ref 0–1)
Eosinophils Absolute: 0 10*3/uL (ref 0.0–0.7)
Eosinophils Relative: 1 % (ref 0–5)
HCT: 36.9 % (ref 36.0–46.0)
Hemoglobin: 13.1 g/dL (ref 12.0–15.0)
Lymphocytes Relative: 52 % — ABNORMAL HIGH (ref 12–46)
Lymphs Abs: 2.6 10*3/uL (ref 0.7–4.0)
MCH: 30.7 pg (ref 26.0–34.0)
MCHC: 35.5 g/dL (ref 30.0–36.0)
MCV: 86.4 fL (ref 78.0–100.0)
Monocytes Absolute: 0.4 10*3/uL (ref 0.1–1.0)
Monocytes Relative: 8 % (ref 3–12)
Neutro Abs: 1.9 10*3/uL (ref 1.7–7.7)
Neutrophils Relative %: 39 % — ABNORMAL LOW (ref 43–77)
Platelets: 241 10*3/uL (ref 150–400)
RBC: 4.27 MIL/uL (ref 3.87–5.11)
RDW: 15 % (ref 11.5–15.5)
WBC: 5 10*3/uL (ref 4.0–10.5)

## 2013-04-12 NOTE — Discharge Instructions (Signed)
Rectal Bleeding Rectal bleeding is when blood passes out of the anus. It is usually a sign that something is wrong. It may not be serious, but it should always be evaluated. Rectal bleeding may present as bright red blood or extremely dark stools. The color may range from dark red or maroon to black (like tar). It is important that the cause of rectal bleeding be identified so treatment can be started and the problem corrected. CAUSES   Hemorrhoids. These are enlarged (dilated) blood vessels or veins in the anal or rectal area.  Fistulas. Theseare abnormal, burrowing channels that usually run from inside the rectum to the skin around the anus. They can bleed.  Anal fissures. This is a tear in the tissue of the anus. Bleeding occurs with bowel movements.  Diverticulosis. This is a condition in which pockets or sacs project from the bowel wall. Occasionally, the sacs can bleed.  Diverticulitis. Thisis an infection involving diverticulosis of the colon.  Proctitis and colitis. These are conditions in which the rectum, colon, or both, can become inflamed and pitted (ulcerated).  Polyps and cancer. Polyps are non-cancerous (benign) growths in the colon that may bleed. Certain types of polyps turn into cancer.  Protrusion of the rectum. Part of the rectum can project from the anus and bleed.  Certain medicines.  Intestinal infections.  Blood vessel abnormalities. HOME CARE INSTRUCTIONS  Eat a high-fiber diet to keep your stool soft.  Limit activity.  Drink enough fluids to keep your urine clear or pale yellow.  Warm baths may be useful to soothe rectal pain.  Follow up with your caregiver as directed. SEEK IMMEDIATE MEDICAL CARE IF:  You develop increased bleeding.  You have black or dark red stools.  You vomit blood or material that looks like coffee grounds.  You have abdominal pain or tenderness.  You have a fever.  You feel weak, nauseous, or you faint.  You have  severe rectal pain or you are unable to have a bowel movement. MAKE SURE YOU:  Understand these instructions.  Will watch your condition.  Will get help right away if you are not doing well or get worse. Document Released: 08/24/2001 Document Revised: 05/27/2011 Document Reviewed: 08/19/2010 [employer] Patient Information 2014 [employer].  

## 2013-04-12 NOTE — ED Notes (Signed)
Pt reports bright red rectal bleeding, "only when I go to the bathroom. I saw some with my bowel movement and then I sat down to pee and it just came out".

## 2013-04-12 NOTE — ED Notes (Signed)
C/o rectal bleeding that Saturday. Treated and sent home. Bright red blood noted again today. C/o fatigue. Denies sob or cp

## 2013-04-12 NOTE — Telephone Encounter (Signed)
Pt called and left VM for callback. Nurse returned call and pt stated that she was recently seen in ED for rectal bleeding. She stated that it had stopped but that she has started bleeding again. Stated it was a small amount. Questioned if she had hx of hemorrhoids which pt denies. Transferred to receptionist to make an appointment. Pt appreciative.

## 2013-04-15 NOTE — ED Provider Notes (Signed)
CSN: [id number]     Arrival date & time 04/12/13  1808 History   First MD Initiated Contact with Patient 04/12/13 2012     Chief Complaint  Patient presents with  . Rectal Bleeding   (Consider location/radiation/quality/duration/timing/severity/associated sxs/prior Treatment) HPI  48 year old female with rectal bleeding. Patient was seen for the same complaint on the 24th. Resolved for about a day or to this evaluation. Returning today with a bleeding reoccurred. No pain. No fevers or chills. No dizziness lightheadedness or shortness of breath. No blood thinners.  Past Medical History  Diagnosis Date  . Lupus   . Thyroid disease   . Hypertension    Past Surgical History  Procedure Laterality Date  . Abdominal surgery    . Abdominal hysterectomy    . Debridement of abdominal wall abscess N/A 02/08/2013    Procedure: DEBRIDEMENT OF ABDOMINAL WALL ABSCESS;  Surgeon: Jamesetta So, MD;  Location: [facility];  Service: General;  Laterality: N/A;   Family History  Problem Relation Age of Onset  . Cancer Son   . Cancer Maternal Aunt   . Cancer Maternal Grandmother    History  Substance Use Topics  . Smoking status: Current Every Day Smoker -- 1.00 packs/day    Types: Cigarettes  . Smokeless tobacco: Never Used  . Alcohol Use: Yes     Comment: beer or liquor daily until "buzz" at least. often in morning.    OB History   Grav Para Term Preterm Abortions TAB SAB Ect Mult Living                 Review of Systems  All systems reviewed and negative, other than as noted in HPI.   Allergies  Codeine and Morphine and related  Home Medications   Current Outpatient Rx  Name  Route  Sig  Dispense  Refill  . lisinopril (PRINIVIL) 10 MG tablet   Oral   Take 1 tablet (10 mg total) by mouth daily.   30 tablet   1   . senna-docusate (SENOKOT-S) 8.6-50 MG per tablet   Oral   Take 2 tablets by mouth daily. With 8 oz of water   20 tablet   0   . Vitamin D, Ergocalciferol,  (DRISDOL) 50000 UNITS CAPS capsule   Oral   Take 50,000 Units by mouth 2 (two) times a week. Takes on Wednesday and Saturday.          BP 109/77  Pulse 110  Temp(Src) 98.2 F (36.8 C) (Oral)  Resp 20  SpO2 98% Physical Exam  Nursing note and vitals reviewed. Constitutional: She appears well-developed and well-nourished. No distress.  HENT:  Head: Normocephalic and atraumatic.  Eyes: Conjunctivae are normal. Right eye exhibits no discharge. Left eye exhibits no discharge.  Neck: Neck supple.  Cardiovascular: Normal rate, regular rhythm and normal heart sounds.  Exam reveals no gallop and no friction rub.   No murmur heard. Pulmonary/Chest: Effort normal and breath sounds normal. No respiratory distress.  Abdominal: Soft. She exhibits no distension. There is no tenderness.  Genitourinary:  Chaperone present. No external lesions noted. Normal rectal tone. No concerning mass palpated. Small amount of blood noted on exam glove.  Musculoskeletal: She exhibits no edema and no tenderness.  Neurological: She is alert.  Skin: Skin is warm and dry.  Psychiatric: She has a normal mood and affect. Her behavior is normal. Thought content normal.    ED Course  Procedures (including critical care time) Labs Review Labs  Reviewed  CBC WITH DIFFERENTIAL - Abnormal; Notable for the following:    Neutrophils Relative % 39 (*)    Lymphocytes Relative 52 (*)    All other components within normal limits  BASIC METABOLIC PANEL   Imaging Review No results found.  EKG Interpretation   None       MDM   1. Rectal bleeding     48 year old female with rectal bleeding. This is the emergency room a couple days ago for the same complaint. Her H&H is stable. Small amount of gross blood was noted on exam without an obvious source. Discussed with patient the need for colonoscopy  Virgel Manifold, MD 04/15/13 0151

## 2013-04-16 DIAGNOSIS — L02219 Cutaneous abscess of trunk, unspecified: Secondary | ICD-10-CM | POA: Diagnosis not present

## 2013-04-16 DIAGNOSIS — T8131XA Disruption of external operation (surgical) wound, not elsewhere classified, initial encounter: Secondary | ICD-10-CM | POA: Diagnosis not present

## 2013-04-16 DIAGNOSIS — M329 Systemic lupus erythematosus, unspecified: Secondary | ICD-10-CM | POA: Diagnosis not present

## 2013-04-16 DIAGNOSIS — T8189XA Other complications of procedures, not elsewhere classified, initial encounter: Secondary | ICD-10-CM | POA: Diagnosis not present

## 2013-04-18 ENCOUNTER — Encounter (HOSPITAL_COMMUNITY): Payer: Self-pay | Admitting: Emergency Medicine

## 2013-04-18 ENCOUNTER — Inpatient Hospital Stay (HOSPITAL_COMMUNITY)
Admission: EM | Admit: 2013-04-18 | Discharge: 2013-04-20 | DRG: 603 | Disposition: A | Discharge status: Home / Self Care | Payer: Medicare Other | Attending: General Surgery | Admitting: General Surgery

## 2013-04-18 ENCOUNTER — Emergency Department (HOSPITAL_COMMUNITY): Payer: Medicare Other

## 2013-04-18 DIAGNOSIS — M329 Systemic lupus erythematosus, unspecified: Secondary | ICD-10-CM | POA: Diagnosis present

## 2013-04-18 DIAGNOSIS — K529 Noninfective gastroenteritis and colitis, unspecified: Secondary | ICD-10-CM

## 2013-04-18 DIAGNOSIS — I10 Essential (primary) hypertension: Secondary | ICD-10-CM | POA: Diagnosis present

## 2013-04-18 DIAGNOSIS — L03319 Cellulitis of trunk, unspecified: Principal | ICD-10-CM

## 2013-04-18 DIAGNOSIS — T8149XA Infection following a procedure, other surgical site, initial encounter: Secondary | ICD-10-CM | POA: Diagnosis present

## 2013-04-18 DIAGNOSIS — L02211 Cutaneous abscess of abdominal wall: Secondary | ICD-10-CM | POA: Diagnosis present

## 2013-04-18 DIAGNOSIS — K5289 Other specified noninfective gastroenteritis and colitis: Secondary | ICD-10-CM | POA: Diagnosis not present

## 2013-04-18 DIAGNOSIS — L02219 Cutaneous abscess of trunk, unspecified: Principal | ICD-10-CM | POA: Diagnosis present

## 2013-04-18 DIAGNOSIS — F17200 Nicotine dependence, unspecified, uncomplicated: Secondary | ICD-10-CM | POA: Diagnosis present

## 2013-04-18 DIAGNOSIS — F1020 Alcohol dependence, uncomplicated: Secondary | ICD-10-CM | POA: Diagnosis present

## 2013-04-18 DIAGNOSIS — Z9884 Bariatric surgery status: Secondary | ICD-10-CM

## 2013-04-18 DIAGNOSIS — F1410 Cocaine abuse, uncomplicated: Secondary | ICD-10-CM | POA: Diagnosis present

## 2013-04-18 LAB — CBC WITH DIFFERENTIAL/PLATELET
Basophils Absolute: 0 10*3/uL (ref 0.0–0.1)
Basophils Relative: 1 % (ref 0–1)
Eosinophils Absolute: 0 10*3/uL (ref 0.0–0.7)
Eosinophils Relative: 1 % (ref 0–5)
HCT: 39.7 % (ref 36.0–46.0)
HEMOGLOBIN: 14.5 g/dL (ref 12.0–15.0)
LYMPHS PCT: 48 % — AB (ref 12–46)
Lymphs Abs: 1.8 10*3/uL (ref 0.7–4.0)
MCH: 31.5 pg (ref 26.0–34.0)
MCHC: 36.5 g/dL — ABNORMAL HIGH (ref 30.0–36.0)
MCV: 86.3 fL (ref 78.0–100.0)
MONOS PCT: 12 % (ref 3–12)
Monocytes Absolute: 0.5 10*3/uL (ref 0.1–1.0)
NEUTROS ABS: 1.5 10*3/uL — AB (ref 1.7–7.7)
NEUTROS PCT: 39 % — AB (ref 43–77)
Platelets: 373 10*3/uL (ref 150–400)
RBC: 4.6 MIL/uL (ref 3.87–5.11)
RDW: 14.8 % (ref 11.5–15.5)
WBC: 3.9 10*3/uL — ABNORMAL LOW (ref 4.0–10.5)

## 2013-04-18 LAB — URINALYSIS, ROUTINE W REFLEX MICROSCOPIC
BILIRUBIN URINE: NEGATIVE
Glucose, UA: NEGATIVE mg/dL
HGB URINE DIPSTICK: NEGATIVE
Ketones, ur: NEGATIVE mg/dL
Leukocytes, UA: NEGATIVE
Nitrite: NEGATIVE
Protein, ur: NEGATIVE mg/dL
Specific Gravity, Urine: 1.025 (ref 1.005–1.030)
UROBILINOGEN UA: 0.2 mg/dL (ref 0.0–1.0)
pH: 5 (ref 5.0–8.0)

## 2013-04-18 LAB — COMPREHENSIVE METABOLIC PANEL
ALK PHOS: 63 U/L (ref 39–117)
ALT: 26 U/L (ref 0–35)
AST: 36 U/L (ref 0–37)
Albumin: 4 g/dL (ref 3.5–5.2)
BILIRUBIN TOTAL: 0.7 mg/dL (ref 0.3–1.2)
BUN: 9 mg/dL (ref 6–23)
CHLORIDE: 94 meq/L — AB (ref 96–112)
CO2: 23 meq/L (ref 19–32)
Calcium: 9.5 mg/dL (ref 8.4–10.5)
Creatinine, Ser: 0.56 mg/dL (ref 0.50–1.10)
GLUCOSE: 83 mg/dL (ref 70–99)
POTASSIUM: 4.3 meq/L (ref 3.7–5.3)
Sodium: 136 mEq/L — ABNORMAL LOW (ref 137–147)
Total Protein: 8.1 g/dL (ref 6.0–8.3)

## 2013-04-18 LAB — LIPASE, BLOOD: LIPASE: 28 U/L (ref 11–59)

## 2013-04-18 LAB — RAPID URINE DRUG SCREEN, HOSP PERFORMED
Amphetamines: NOT DETECTED
BARBITURATES: NOT DETECTED
Benzodiazepines: NOT DETECTED
COCAINE: NOT DETECTED
Opiates: NOT DETECTED
TETRAHYDROCANNABINOL: NOT DETECTED

## 2013-04-18 LAB — ETHANOL: Alcohol, Ethyl (B): 135 mg/dL — ABNORMAL HIGH (ref 0–11)

## 2013-04-18 MED ORDER — VITAMIN B-1 100 MG PO TABS: 100.0000 mg | ORAL_TABLET | Freq: Every day | ORAL | Status: DC

## 2013-04-18 MED ORDER — IOHEXOL 300 MG/ML  SOLN
50.0000 mL | Freq: Once | INTRAMUSCULAR | Status: AC | PRN
  Administered 2013-04-18: 50 mL via ORAL

## 2013-04-18 MED ORDER — SODIUM CHLORIDE 0.9 % IV SOLN
1000.0000 mL | Freq: Once | INTRAVENOUS | Status: AC
  Administered 2013-04-18: 1000 mL via INTRAVENOUS

## 2013-04-18 MED ORDER — SODIUM CHLORIDE 0.9 % IV SOLN
1000.0000 mL | INTRAVENOUS | Status: DC
Start: 2013-04-18 — End: 2013-04-19

## 2013-04-18 MED ORDER — ONDANSETRON HCL 4 MG/2ML IJ SOLN
4.0000 mg | Freq: Once | INTRAMUSCULAR | Status: AC
  Administered 2013-04-18: 4 mg via INTRAVENOUS
  Filled 2013-04-18: qty 2

## 2013-04-18 MED ORDER — FENTANYL CITRATE 0.05 MG/ML IJ SOLN
50.0000 ug | Freq: Once | INTRAMUSCULAR | Status: AC
  Administered 2013-04-18: 50 ug via INTRAVENOUS
  Filled 2013-04-18: qty 2

## 2013-04-18 MED ORDER — THIAMINE HCL 100 MG/ML IJ SOLN: 100.0000 mg | Freq: Every day | INTRAMUSCULAR | Status: DC

## 2013-04-18 MED ORDER — PIPERACILLIN-TAZOBACTAM 3.375 G IVPB 30 MIN
3.3750 g | Freq: Once | INTRAVENOUS | Status: AC
  Administered 2013-04-19: 3.375 g via INTRAVENOUS
  Filled 2013-04-18: qty 50

## 2013-04-18 MED ORDER — IOHEXOL 300 MG/ML  SOLN
100.0000 mL | Freq: Once | INTRAMUSCULAR | Status: AC | PRN
  Administered 2013-04-18: 100 mL via INTRAVENOUS

## 2013-04-18 MED ORDER — VANCOMYCIN HCL IN DEXTROSE 1-5 GM/200ML-% IV SOLN
1000.0000 mg | Freq: Once | INTRAVENOUS | Status: AC
  Administered 2013-04-18: 1000 mg via INTRAVENOUS
  Filled 2013-04-18: qty 200

## 2013-04-18 MED ORDER — ONDANSETRON HCL 4 MG/2ML IJ SOLN
4.0000 mg | Freq: Once | INTRAMUSCULAR | Status: DC
  Filled 2013-04-18: qty 2

## 2013-04-18 MED ORDER — SODIUM CHLORIDE 0.9 % IV SOLN
INTRAVENOUS | Status: DC
  Administered 2013-04-18: 20:00:00 via INTRAVENOUS

## 2013-04-18 MED ORDER — PIPERACILLIN-TAZOBACTAM 3.375 G IVPB
INTRAVENOUS | Status: AC
  Filled 2013-04-18: qty 50

## 2013-04-18 MED ORDER — ONDANSETRON HCL 4 MG/2ML IJ SOLN
4.0000 mg | Freq: Once | INTRAMUSCULAR | Status: AC
  Administered 2013-04-18: 4 mg via INTRAVENOUS

## 2013-04-18 MED ORDER — LORAZEPAM 2 MG/ML IJ SOLN: 0.0000 mg | Freq: Four times a day (QID) | INTRAMUSCULAR | Status: DC

## 2013-04-18 MED ORDER — LORAZEPAM 2 MG/ML IJ SOLN: 0.0000 mg | Freq: Two times a day (BID) | INTRAMUSCULAR | Status: DC

## 2013-04-18 NOTE — ED Notes (Signed)
Pt c/o n/v/d, abd pain that started this am, pt reports that she had surgery due to "rotten scar tissue" in November in the same area of the pain, pt has redness, warm to touch area located to the left of the umbilicus area. Dr. Tomi Bamberger at bedside, see edp assessment for further,

## 2013-04-18 NOTE — ED Notes (Signed)
2nd fluid bolus completed. Unable to document completion on MAR.

## 2013-04-18 NOTE — ED Provider Notes (Signed)
CSN: [id number]     Arrival date & time 04/18/13  1729 History   This chart was scribed for Janice Norrie, MD by Maree Erie, ED Scribe. The patient was seen in room [facility]. Patient's care was started at 6:49 PM.     Chief Complaint  Patient presents with  . Abdominal Pain    Patient is a 48 y.o. female presenting with abdominal pain. The history is provided by the patient. No language interpreter was used.  Abdominal Pain Associated symptoms: diarrhea and vomiting   Associated symptoms: no dysuria, no fever and no hematuria     HPI Comments: Angel Price is a 48 y.o. female with a history of Lupus, thyroid disease and HTN who presents to the Emergency Department complaining of constant left periumbilical abdominal pain from a "knot" that began yesterday. She describes the pain as sore. The pain is worsened by touch and improved by lying down. She states the area has been draining. She also reports over ten episodes of both emesis and diarrhea today. She describes the diarrhea as watery and the vomit as having black specks in it. She has a past abdominal surgical history of gastric bypass in [id number], complicated by scar tissue and repeat surgeries performed by Dr. Arnoldo Morale. She states she has had seven or eight surgeries following the bypass to remove scar tissue. Her last surgery was in November, a week before Thanksgiving. She states that the pain is coming from one of the incision sites. She denies a history of abscesses. She denies fever, frequency, dysuria, hematuria or decreased urine output. She currently smokes a half pack to a pack a day. She reports alcohol use and states that she last drank one beer this morning, possibly after she had gotten ill. She states the least amount of alcohol she drinks in a day is six beers and the most is about twelve beers. She and her significant other deny that she has an alcohol problem. She is currently on disability for lupus. She was taking prednisone  for it but states she stopped taking it last year because she ran out of the medication. She states that she is seen at [facility] for it.    Seen for Lupus at [place] Her PCP is Dr. Clydene Laming at [facility] Surgeon is Dr. Arnoldo Morale.    Past Medical History  Diagnosis Date  . Lupus   . Thyroid disease   . Hypertension    Past Surgical History  Procedure Laterality Date  . Abdominal surgery    . Abdominal hysterectomy    . Debridement of abdominal wall abscess N/A 02/08/2013    Procedure: DEBRIDEMENT OF ABDOMINAL WALL ABSCESS;  Surgeon: Jamesetta So, MD;  Location: [facility];  Service: General;  Laterality: N/A;   Family History  Problem Relation Age of Onset  . Cancer Son   . Cancer Maternal Aunt   . Cancer Maternal Grandmother    History  Substance Use Topics  . Smoking status: Current Every Day Smoker -- 1.00 packs/day    Types: Cigarettes  . Smokeless tobacco: Never Used  . Alcohol Use: Yes     Comment: beer or liquor daily until "buzz" at least. often in morning.   drinks 6- 12 beers daily including this am On disability for lupus Smokes 1/2-1 ppd  OB History   Grav Para Term Preterm Abortions TAB SAB Ect Mult Living                 Review  of Systems  Constitutional: Negative for fever.  Gastrointestinal: Positive for vomiting, abdominal pain and diarrhea.  Genitourinary: Negative for dysuria, frequency, hematuria and decreased urine volume.  All other systems reviewed and are negative.    Allergies  Codeine and Morphine and related  Home Medications   Current Outpatient Rx  Name  Route  Sig  Dispense  Refill  . lisinopril (PRINIVIL) 10 MG tablet   Oral   Take 1 tablet (10 mg total) by mouth daily.   30 tablet   1   . senna-docusate (SENOKOT-S) 8.6-50 MG per tablet   Oral   Take 2 tablets by mouth daily. With 8 oz of water   20 tablet   0   . Vitamin D, Ergocalciferol, (DRISDOL) 50000 UNITS CAPS capsule   Oral   Take 50,000 Units by mouth 2 (two)  times a week. Takes on Wednesday and Saturday.          Triage Vitals: BP 142/103  Pulse 154  Temp(Src) 98.7 F (37.1 C) (Oral)  Resp 16  Ht 5\' 3"  (1.6 m)  Wt 133 lb (60.328 kg)  BMI 23.57 kg/m2  SpO2 99%  Vital signs normal except for tachycardia   Physical Exam  Nursing note and vitals reviewed. Constitutional: She is oriented to person, place, and time. She appears well-developed and well-nourished.  Non-toxic appearance. She does not appear ill. No distress.  HENT:  Head: Normocephalic and atraumatic.  Right Ear: External ear normal.  Left Ear: External ear normal.  Nose: Nose normal. No mucosal edema or rhinorrhea.  Mouth/Throat: Oropharynx is clear and moist and mucous membranes are normal. No dental abscesses or uvula swelling.  Eyes: Conjunctivae and EOM are normal. Pupils are equal, round, and reactive to light.  Neck: Normal range of motion and full passive range of motion without pain. Neck supple.  Cardiovascular: Normal rate, regular rhythm and normal heart sounds.  Exam reveals no gallop and no friction rub.   No murmur heard. Pulmonary/Chest: Effort normal and breath sounds normal. No respiratory distress. She has no wheezes. She has no rhonchi. She has no rales. She exhibits no tenderness and no crepitus.  Abdominal: Soft. Normal appearance and bowel sounds are normal. She exhibits no distension. There is tenderness. There is no rebound and no guarding.    Old upper midline healed surgical scar from the umbilicus superiorly. Area of redness and swelling with firmness in subcutaneous tissues inferior and to the left of umbilicus that is tender to palpation and warm to touch.   Musculoskeletal: Normal range of motion. She exhibits no edema and no tenderness.  Moves all extremities well.   Neurological: She is alert and oriented to person, place, and time. She has normal strength. No cranial nerve deficit.  Skin: Skin is warm, dry and intact. No rash noted. No  erythema. No pallor.  Psychiatric: She has a normal mood and affect. Her speech is normal and behavior is normal. Her mood appears not anxious.        ED Course  Procedures (including critical care time)  Medications  0.9 %  sodium chloride infusion ( Intravenous New Bag/Given 04/18/13 1943)  0.9 %  sodium chloride infusion (0 mLs Intravenous Stopped 04/18/13 2055)    Followed by  0.9 %  sodium chloride infusion (1,000 mLs Intravenous New Bag/Given 04/18/13 2055)    Followed by  0.9 %  sodium chloride infusion (not administered)  vancomycin (VANCOCIN) IVPB 1000 mg/200 mL premix (not administered)  piperacillin-tazobactam (  ZOSYN) IVPB 3.375 g (not administered)  fentaNYL (SUBLIMAZE) injection 50 mcg (50 mcg Intravenous Given 04/18/13 1938)  ondansetron (ZOFRAN) injection 4 mg (4 mg Intravenous Given 04/18/13 1937)  iohexol (OMNIPAQUE) 300 MG/ML solution 50 mL (50 mLs Oral Contrast Given 04/18/13 2038)  iohexol (OMNIPAQUE) 300 MG/ML solution 100 mL (100 mLs Intravenous Contrast Given 04/18/13 2123)  fentaNYL (SUBLIMAZE) injection 50 mcg (50 mcg Intravenous Given 04/18/13 2202)  ondansetron (ZOFRAN) injection 4 mg (4 mg Intravenous Given 04/18/13 2202)  ondansetron (ZOFRAN) injection 4 mg (4 mg Intravenous Given 04/18/13 2300)    DIAGNOSTIC STUDIES: Oxygen Saturation is 99% on room air, normal by my interpretation.    COORDINATION OF CARE: 6:57 PM -Will order Ethanol, Urinalysis, Drug Screen Panel, CBC, CMP and blood lipase. Patient verbalizes understanding and agrees with treatment plan.  Pt given IV fluids, pain and nausea medications.   22:24 Dr Arnoldo Morale, admit to med-surg, start vancomycin and zosyn, if can get culture before antibiotics great, NPO after MN, will see in am.  23:45 I went to talk to patient about admission, and she has purulent drainage from a wound just inferior and to the right of the umbilicus, palpating the mass on the left doesn't make it drain more. Culture obtained and IV  antibiotics ordered.    CT Abdomen/Pelvis 02/06/2013 IMPRESSION:  1. Examination is positive for multiple periumbilical fluid  collections within the ventral abdominal wall. Cannot rule out  enterocutaneous fistula.  2. Round, indeterminate intermediate attenuating structure within  the left pelvis.  3. Indeterminate left adrenal nodule. Recommend followup, non  hemorrhage imaging with adrenal gland MRI.  Electronically Signed  By: Kerby Moors M.D.  On: 02/06/2013 09:23   Results for orders placed during the hospital encounter of 04/18/13  CBC WITH DIFFERENTIAL      Result Value Range   WBC 3.9 (*) 4.0 - 10.5 K/uL   RBC 4.60  3.87 - 5.11 MIL/uL   Hemoglobin 14.5  12.0 - 15.0 g/dL   HCT 39.7  36.0 - 46.0 %   MCV 86.3  78.0 - 100.0 fL   MCH 31.5  26.0 - 34.0 pg   MCHC 36.5 (*) 30.0 - 36.0 g/dL   RDW 14.8  11.5 - 15.5 %   Platelets 373  150 - 400 K/uL   Neutrophils Relative % 39 (*) 43 - 77 %   Neutro Abs 1.5 (*) 1.7 - 7.7 K/uL   Lymphocytes Relative 48 (*) 12 - 46 %   Lymphs Abs 1.8  0.7 - 4.0 K/uL   Monocytes Relative 12  3 - 12 %   Monocytes Absolute 0.5  0.1 - 1.0 K/uL   Eosinophils Relative 1  0 - 5 %   Eosinophils Absolute 0.0  0.0 - 0.7 K/uL   Basophils Relative 1  0 - 1 %   Basophils Absolute 0.0  0.0 - 0.1 K/uL  COMPREHENSIVE METABOLIC PANEL      Result Value Range   Sodium 136 (*) 137 - 147 mEq/L   Potassium 4.3  3.7 - 5.3 mEq/L   Chloride 94 (*) 96 - 112 mEq/L   CO2 23  19 - 32 mEq/L   Glucose, Bld 83  70 - 99 mg/dL   BUN 9  6 - 23 mg/dL   Creatinine, Ser 0.56  0.50 - 1.10 mg/dL   Calcium 9.5  8.4 - 10.5 mg/dL   Total Protein 8.1  6.0 - 8.3 g/dL   Albumin 4.0  3.5 - 5.2 g/dL  AST 36  0 - 37 U/L   ALT 26  0 - 35 U/L   Alkaline Phosphatase 63  39 - 117 U/L   Total Bilirubin 0.7  0.3 - 1.2 mg/dL   GFR calc non Af Amer >90  >90 mL/min   GFR calc Af Amer >90  >90 mL/min  LIPASE, BLOOD      Result Value Range   Lipase 28  11 - 59 U/L  ETHANOL       Result Value Range   Alcohol, Ethyl (B) 135 (*) 0 - 11 mg/dL  URINALYSIS, ROUTINE W REFLEX MICROSCOPIC      Result Value Range   Color, Urine YELLOW  YELLOW   APPearance CLEAR  CLEAR   Specific Gravity, Urine 1.025  1.005 - 1.030   pH 5.0  5.0 - 8.0   Glucose, UA NEGATIVE  NEGATIVE mg/dL   Hgb urine dipstick NEGATIVE  NEGATIVE   Bilirubin Urine NEGATIVE  NEGATIVE   Ketones, ur NEGATIVE  NEGATIVE mg/dL   Protein, ur NEGATIVE  NEGATIVE mg/dL   Urobilinogen, UA 0.2  0.0 - 1.0 mg/dL   Nitrite NEGATIVE  NEGATIVE   Leukocytes, UA NEGATIVE  NEGATIVE  URINE RAPID DRUG SCREEN (HOSP PERFORMED)      Result Value Range   Opiates NONE DETECTED  NONE DETECTED   Cocaine NONE DETECTED  NONE DETECTED   Benzodiazepines NONE DETECTED  NONE DETECTED   Amphetamines NONE DETECTED  NONE DETECTED   Tetrahydrocannabinol NONE DETECTED  NONE DETECTED   Barbiturates NONE DETECTED  NONE DETECTED    Laboratory interpretation all normal except for alcohol intoxication  Ct Abdomen Pelvis W Contrast  04/18/2013   CLINICAL DATA:  History of lupus and thyroid disease with abdominal pain and swelling and redness inferior and lateral to the umbilicus  EXAM: CT ABDOMEN AND PELVIS WITH CONTRAST  TECHNIQUE: Multidetector CT imaging of the abdomen and pelvis was performed using the standard protocol following bolus administration of intravenous contrast.  CONTRAST:  61mL OMNIPAQUE IOHEXOL 300 MG/ML SOLN, 142mL OMNIPAQUE IOHEXOL 300 MG/ML SOLN  COMPARISON:  None.  FINDINGS: Visualized lung bases clear. Mild atelectatic change the left lung base.  Significant diffuse fatty infiltration of the liver. No focal hepatic abnormalities. Gallbladder is surgically absent. Spleen is normal. Pancreas is normal. Mild dilatation of the common bile duct is likely related to prior cholecystectomy. There is a left adrenal mass. It measures 24 x 21 mm. It demonstrates an average attenuation value of 56. Right adrenal gland appears to be  surgically absent. Right kidney is normal. Left kidney is normal. There is mild dilatation of left ureter as compared to the right but there is no stone or perinephric inflammatory change.  Bladder is normal. There is wall thickening involving the rectum, sigmoid colon, and descending colon. The transverse colon and ascending colon as well as the cecum shows similar up mild to moderate diffuse wall thickening.  Inferior to the umbilicus, there is a rim enhancing fluid collection in the left subcutaneous soft tissues, measuring 28 x 25 mm. This associated with diffuse infraumbilical midline wall thickening and a band of inflammatory change extending to the anterior abdominal wall, measuring about 3.8 cm. The entire presumably inflammatory process measures 84 x 28 mm. There may be a tiny focus of gas chest inside the anterior abdominal wall to the left of midline at this level seen on image number 53. Across the abdominal wall, inside the peritoneal cavity adjacent to this inflammatory process,  there are several loops of otherwise normal appearing small bowel located anteriorly against the anterior abdominal wall.  IMPRESSION: 1. Moderately severe diffuse colon wall thickening consistent with diffuse colitis 2. Inflammatory change in the infraumbilical abdominal wall in the midline and to the left of midline. There appears to be at least 1 abscess cavity towards the left aspect of this process, measuring 2.8 x 2.5 cm. Etiology is uncertain. This process extends down to the deep fascia of the abdominal wall. Across the abdominal wall within the adjacent peritoneum, there are numerous loops of otherwise normal appearing small bowel. There is an equivocal tiny focus of gas anteriorly within the peritoneum at this level. Bowel perforation with spread of infection to the anterior abdominal wall is not excluded. It is also possible this represents a superficial infection that spreads down to the deep subcutaneous soft  tissues but does not involve bowel. Note that the inflamed: Described above does not pass under knee the subcutaneous inflammation and it appears to be a separate process. 3. Indeterminate left adrenal mass. Recommend nonemergent further characterization with adrenal protocol MRI.   Electronically Signed   By: Skipper Cliche M.D.   On: 04/18/2013 22:08     EKG Interpretation   None       MDM   1. Abdominal wall abscess   2. Colitis   3. Alcoholism    Plan admission.   I personally performed the services described in this documentation, which was scribed in my presence. The recorded information has been reviewed and considered.  Rolland Porter, MD, [facility]   Janice Norrie, MD 04/19/13 [phone]

## 2013-04-18 NOTE — ED Notes (Signed)
Mid abd pain with n/v/d starting today.

## 2013-04-19 DIAGNOSIS — T8149XA Infection following a procedure, other surgical site, initial encounter: Secondary | ICD-10-CM | POA: Diagnosis present

## 2013-04-19 MED ORDER — THIAMINE HCL 100 MG/ML IJ SOLN
100.0000 mg | Freq: Every day | INTRAMUSCULAR | Status: DC
  Administered 2013-04-19: 100 mg via INTRAVENOUS
  Filled 2013-04-19: qty 2

## 2013-04-19 MED ORDER — ACETAMINOPHEN 325 MG PO TABS: 650.0000 mg | ORAL_TABLET | Freq: Four times a day (QID) | ORAL | Status: DC | PRN

## 2013-04-19 MED ORDER — DIPHENHYDRAMINE HCL 12.5 MG/5ML PO ELIX
12.5000 mg | ORAL_SOLUTION | Freq: Four times a day (QID) | ORAL | Status: DC | PRN
  Administered 2013-04-20: 25 mg via ORAL
  Administered 2013-04-20: 12.5 mg via ORAL
  Filled 2013-04-19: qty 10
  Filled 2013-04-19: qty 5

## 2013-04-19 MED ORDER — PIPERACILLIN-TAZOBACTAM 3.375 G IVPB
INTRAVENOUS | Status: AC
  Filled 2013-04-19: qty 50

## 2013-04-19 MED ORDER — FOLIC ACID 1 MG PO TABS
1.0000 mg | ORAL_TABLET | Freq: Every day | ORAL | Status: DC
  Administered 2013-04-19: 1 mg via ORAL
  Filled 2013-04-19: qty 1

## 2013-04-19 MED ORDER — ADULT MULTIVITAMIN W/MINERALS CH
1.0000 | ORAL_TABLET | Freq: Every day | ORAL | Status: DC
  Administered 2013-04-19: 1 via ORAL
  Filled 2013-04-19: qty 1

## 2013-04-19 MED ORDER — PANTOPRAZOLE SODIUM 40 MG PO TBEC
40.0000 mg | DELAYED_RELEASE_TABLET | Freq: Every day | ORAL | Status: DC
  Administered 2013-04-19: 40 mg via ORAL
  Filled 2013-04-19: qty 1

## 2013-04-19 MED ORDER — ENOXAPARIN SODIUM 40 MG/0.4ML ~~LOC~~ SOLN
40.0000 mg | SUBCUTANEOUS | Status: DC
  Administered 2013-04-19: 40 mg via SUBCUTANEOUS
  Filled 2013-04-19: qty 0.4

## 2013-04-19 MED ORDER — KCL IN DEXTROSE-NACL 20-5-0.45 MEQ/L-%-% IV SOLN
INTRAVENOUS | Status: DC
  Administered 2013-04-19 (×2): via INTRAVENOUS

## 2013-04-19 MED ORDER — SODIUM CHLORIDE 0.9 % IV SOLN: INTRAVENOUS | Status: DC

## 2013-04-19 MED ORDER — HYDROMORPHONE HCL PF 1 MG/ML IJ SOLN
1.0000 mg | INTRAMUSCULAR | Status: DC | PRN
  Administered 2013-04-19 – 2013-04-20 (×12): 1 mg via INTRAVENOUS
  Filled 2013-04-19 (×12): qty 1

## 2013-04-19 MED ORDER — FENTANYL CITRATE 0.05 MG/ML IJ SOLN
50.0000 ug | INTRAMUSCULAR | Status: DC | PRN
Start: 2013-04-19 — End: 2013-04-19

## 2013-04-19 MED ORDER — ACETAMINOPHEN 650 MG RE SUPP
650.0000 mg | Freq: Four times a day (QID) | RECTAL | Status: DC | PRN
Start: 2013-04-19 — End: 2013-04-20

## 2013-04-19 MED ORDER — NICOTINE 21 MG/24HR TD PT24
21.0000 mg | MEDICATED_PATCH | Freq: Every day | TRANSDERMAL | Status: DC
  Administered 2013-04-19: 21 mg via TRANSDERMAL
  Filled 2013-04-19: qty 1

## 2013-04-19 MED ORDER — ONDANSETRON HCL 4 MG/2ML IJ SOLN
4.0000 mg | Freq: Three times a day (TID) | INTRAMUSCULAR | Status: AC | PRN
  Administered 2013-04-19 (×2): 4 mg via INTRAVENOUS
  Filled 2013-04-19 (×2): qty 2

## 2013-04-19 MED ORDER — VITAMIN B-1 100 MG PO TABS
100.0000 mg | ORAL_TABLET | Freq: Every day | ORAL | Status: DC
  Filled 2013-04-19: qty 1

## 2013-04-19 MED ORDER — DIPHENHYDRAMINE HCL 50 MG/ML IJ SOLN
12.5000 mg | Freq: Four times a day (QID) | INTRAMUSCULAR | Status: DC | PRN
  Administered 2013-04-19 (×2): 25 mg via INTRAVENOUS
  Filled 2013-04-19 (×2): qty 1

## 2013-04-19 MED ORDER — LORAZEPAM 2 MG/ML IJ SOLN: 1.0000 mg | Freq: Four times a day (QID) | INTRAMUSCULAR | Status: DC | PRN

## 2013-04-19 MED ORDER — LORAZEPAM 1 MG PO TABS: 1.0000 mg | ORAL_TABLET | Freq: Four times a day (QID) | ORAL | Status: DC | PRN

## 2013-04-19 MED ORDER — LISINOPRIL 10 MG PO TABS
10.0000 mg | ORAL_TABLET | Freq: Every day | ORAL | Status: DC
  Administered 2013-04-19: 10 mg via ORAL
  Filled 2013-04-19: qty 1

## 2013-04-19 MED ORDER — MUPIROCIN CALCIUM 2 % EX CREA
TOPICAL_CREAM | Freq: Three times a day (TID) | CUTANEOUS | Status: DC
  Administered 2013-04-19: 15:00:00 via TOPICAL
  Administered 2013-04-19: 1 via TOPICAL
  Administered 2013-04-19: 22:00:00 via TOPICAL
  Filled 2013-04-19: qty 15

## 2013-04-19 NOTE — H&P (Signed)
Angel Price is an 48 y.o. female.   Chief Complaint: Abdominal wound HPI: Patient is a 48 year old black female status post multiple abdominal surgeries in the past 2 last November underwent incision and drainage of an abdominal wall abscess. This was subcutaneous in nature and did not communicate into the abdomen. She now presents with a two-day history of worsening pain with a draining sinus along the inferior aspect of the previously drained abscess. She does have a history of alcohol abuse, and has an elevated alcohol level upon presentation to the emergency room.  Past Medical History  Diagnosis Date  . Lupus   . Thyroid disease   . Hypertension     Past Surgical History  Procedure Laterality Date  . Abdominal surgery    . Abdominal hysterectomy    . Debridement of abdominal wall abscess N/A 02/08/2013    Procedure: DEBRIDEMENT OF ABDOMINAL WALL ABSCESS;  Surgeon: Jamesetta So, MD;  Location: [facility];  Service: General;  Laterality: N/A;    Family History  Problem Relation Age of Onset  . Cancer Son   . Cancer Maternal Aunt   . Cancer Maternal Grandmother    Social History:  reports that she has been smoking Cigarettes.  She has been smoking about 1.00 pack per day. She has never used smokeless tobacco. She reports that she drinks alcohol. She reports that she uses illicit drugs (Cocaine).  Allergies:  Allergies  Allergen Reactions  . Codeine     Hives  . Morphine And Related     gastritis    Medications Prior to Admission  Medication Sig Dispense Refill  . lisinopril (PRINIVIL) 10 MG tablet Take 1 tablet (10 mg total) by mouth daily.  30 tablet  1  . senna-docusate (SENOKOT-S) 8.6-50 MG per tablet Take 2 tablets by mouth daily. With 8 oz of water  20 tablet  0  . Vitamin D, Ergocalciferol, (DRISDOL) 50000 UNITS CAPS capsule Take 50,000 Units by mouth 2 (two) times a week. Takes on Wednesday and Saturday.        Results for orders placed during the hospital  encounter of 04/18/13 (from the past 48 hour(s))  CBC WITH DIFFERENTIAL     Status: Abnormal   Collection Time    04/18/13  7:13 PM      Result Value Range   WBC 3.9 (*) 4.0 - 10.5 K/uL   RBC 4.60  3.87 - 5.11 MIL/uL   Hemoglobin 14.5  12.0 - 15.0 g/dL   HCT 39.7  36.0 - 46.0 %   MCV 86.3  78.0 - 100.0 fL   MCH 31.5  26.0 - 34.0 pg   MCHC 36.5 (*) 30.0 - 36.0 g/dL   RDW 14.8  11.5 - 15.5 %   Platelets 373  150 - 400 K/uL   Neutrophils Relative % 39 (*) 43 - 77 %   Neutro Abs 1.5 (*) 1.7 - 7.7 K/uL   Lymphocytes Relative 48 (*) 12 - 46 %   Lymphs Abs 1.8  0.7 - 4.0 K/uL   Monocytes Relative 12  3 - 12 %   Monocytes Absolute 0.5  0.1 - 1.0 K/uL   Eosinophils Relative 1  0 - 5 %   Eosinophils Absolute 0.0  0.0 - 0.7 K/uL   Basophils Relative 1  0 - 1 %   Basophils Absolute 0.0  0.0 - 0.1 K/uL  COMPREHENSIVE METABOLIC PANEL     Status: Abnormal   Collection Time    04/18/13  7:13 PM      Result Value Range   Sodium 136 (*) 137 - 147 mEq/L   Potassium 4.3  3.7 - 5.3 mEq/L   Chloride 94 (*) 96 - 112 mEq/L   CO2 23  19 - 32 mEq/L   Glucose, Bld 83  70 - 99 mg/dL   BUN 9  6 - 23 mg/dL   Creatinine, Ser 0.56  0.50 - 1.10 mg/dL   Calcium 9.5  8.4 - 10.5 mg/dL   Total Protein 8.1  6.0 - 8.3 g/dL   Albumin 4.0  3.5 - 5.2 g/dL   AST 36  0 - 37 U/L   ALT 26  0 - 35 U/L   Alkaline Phosphatase 63  39 - 117 U/L   Total Bilirubin 0.7  0.3 - 1.2 mg/dL   GFR calc non Af Amer >90  >90 mL/min   GFR calc Af Amer >90  >90 mL/min   Comment: (NOTE)     The eGFR has been calculated using the CKD EPI equation.     This calculation has not been validated in all clinical situations.     eGFR's persistently <90 mL/min signify possible Chronic Kidney     Disease.  LIPASE, BLOOD     Status: None   Collection Time    04/18/13  7:13 PM      Result Value Range   Lipase 28  11 - 59 U/L  ETHANOL     Status: Abnormal   Collection Time    04/18/13  7:13 PM      Result Value Range   Alcohol, Ethyl  (B) 135 (*) 0 - 11 mg/dL   Comment:            LOWEST DETECTABLE LIMIT FOR     SERUM ALCOHOL IS 11 mg/dL     FOR MEDICAL PURPOSES ONLY  URINALYSIS, ROUTINE W REFLEX MICROSCOPIC     Status: None   Collection Time    04/18/13  8:29 PM      Result Value Range   Color, Urine YELLOW  YELLOW   APPearance CLEAR  CLEAR   Specific Gravity, Urine 1.025  1.005 - 1.030   pH 5.0  5.0 - 8.0   Glucose, UA NEGATIVE  NEGATIVE mg/dL   Hgb urine dipstick NEGATIVE  NEGATIVE   Bilirubin Urine NEGATIVE  NEGATIVE   Ketones, ur NEGATIVE  NEGATIVE mg/dL   Protein, ur NEGATIVE  NEGATIVE mg/dL   Urobilinogen, UA 0.2  0.0 - 1.0 mg/dL   Nitrite NEGATIVE  NEGATIVE   Leukocytes, UA NEGATIVE  NEGATIVE   Comment: MICROSCOPIC NOT DONE ON URINES WITH NEGATIVE PROTEIN, BLOOD, LEUKOCYTES, NITRITE, OR GLUCOSE <1000 mg/dL.  URINE RAPID DRUG SCREEN (HOSP PERFORMED)     Status: None   Collection Time    04/18/13  8:30 PM      Result Value Range   Opiates NONE DETECTED  NONE DETECTED   Cocaine NONE DETECTED  NONE DETECTED   Benzodiazepines NONE DETECTED  NONE DETECTED   Amphetamines NONE DETECTED  NONE DETECTED   Tetrahydrocannabinol NONE DETECTED  NONE DETECTED   Barbiturates NONE DETECTED  NONE DETECTED   Comment:            DRUG SCREEN FOR MEDICAL PURPOSES     ONLY.  IF CONFIRMATION IS NEEDED     FOR ANY PURPOSE, NOTIFY LAB     WITHIN 5 DAYS.  LOWEST DETECTABLE LIMITS     FOR URINE DRUG SCREEN     Drug Class       Cutoff (ng/mL)     Amphetamine      1000     Barbiturate      200     Benzodiazepine   157     Tricyclics       262     Opiates          300     Cocaine          300     THC              50   Ct Abdomen Pelvis W Contrast  04/18/2013   CLINICAL DATA:  History of lupus and thyroid disease with abdominal pain and swelling and redness inferior and lateral to the umbilicus  EXAM: CT ABDOMEN AND PELVIS WITH CONTRAST  TECHNIQUE: Multidetector CT imaging of the abdomen and pelvis was  performed using the standard protocol following bolus administration of intravenous contrast.  CONTRAST:  17m OMNIPAQUE IOHEXOL 300 MG/ML SOLN, 1010mOMNIPAQUE IOHEXOL 300 MG/ML SOLN  COMPARISON:  None.  FINDINGS: Visualized lung bases clear. Mild atelectatic change the left lung base.  Significant diffuse fatty infiltration of the liver. No focal hepatic abnormalities. Gallbladder is surgically absent. Spleen is normal. Pancreas is normal. Mild dilatation of the common bile duct is likely related to prior cholecystectomy. There is a left adrenal mass. It measures 24 x 21 mm. It demonstrates an average attenuation value of 56. Right adrenal gland appears to be surgically absent. Right kidney is normal. Left kidney is normal. There is mild dilatation of left ureter as compared to the right but there is no stone or perinephric inflammatory change.  Bladder is normal. There is wall thickening involving the rectum, sigmoid colon, and descending colon. The transverse colon and ascending colon as well as the cecum shows similar up mild to moderate diffuse wall thickening.  Inferior to the umbilicus, there is a rim enhancing fluid collection in the left subcutaneous soft tissues, measuring 28 x 25 mm. This associated with diffuse infraumbilical midline wall thickening and a band of inflammatory change extending to the anterior abdominal wall, measuring about 3.8 cm. The entire presumably inflammatory process measures 84 x 28 mm. There may be a tiny focus of gas chest inside the anterior abdominal wall to the left of midline at this level seen on image number 53. Across the abdominal wall, inside the peritoneal cavity adjacent to this inflammatory process, there are several loops of otherwise normal appearing small bowel located anteriorly against the anterior abdominal wall.  IMPRESSION: 1. Moderately severe diffuse colon wall thickening consistent with diffuse colitis 2. Inflammatory change in the infraumbilical  abdominal wall in the midline and to the left of midline. There appears to be at least 1 abscess cavity towards the left aspect of this process, measuring 2.8 x 2.5 cm. Etiology is uncertain. This process extends down to the deep fascia of the abdominal wall. Across the abdominal wall within the adjacent peritoneum, there are numerous loops of otherwise normal appearing small bowel. There is an equivocal tiny focus of gas anteriorly within the peritoneum at this level. Bowel perforation with spread of infection to the anterior abdominal wall is not excluded. It is also possible this represents a superficial infection that spreads down to the deep subcutaneous soft tissues but does not involve bowel. Note that the inflamed: Described above does not pass  under knee the subcutaneous inflammation and it appears to be a separate process. 3. Indeterminate left adrenal mass. Recommend nonemergent further characterization with adrenal protocol MRI.   Electronically Signed   By: Skipper Cliche M.D.   On: 04/18/2013 22:08    Review of Systems  Constitutional: Positive for malaise/fatigue.  HENT: Negative.   Respiratory: Negative.   Cardiovascular: Negative.   Gastrointestinal: Positive for abdominal pain.  Genitourinary: Negative.   Musculoskeletal: Negative.   Skin: Negative.     Blood pressure 99/65, pulse 71, temperature 97.9 F (36.6 C), temperature source Oral, resp. rate 18, height _0  (1.6 m), weight 62.279 kg (137 lb 4.8 oz), SpO2 98.00%. Physical Exam  Constitutional: She is oriented to person, place, and time. She appears well-developed and well-nourished.  HENT:  Head: Normocephalic and atraumatic.  Neck: Normal range of motion. Neck supple.  Cardiovascular: Normal rate, regular rhythm and normal heart sounds.   Respiratory: Effort normal and breath sounds normal.  GI: Soft. Bowel sounds are normal. She exhibits no distension. There is tenderness.  Has multiple surgical scars present on  her abdomen. A small draining sinus is noted in the periumbilical region. A small amount of fluid is present. No fluctuance is noted.  Neurological: She is alert and oriented to person, place, and time.  Skin: Skin is warm and dry.     Assessment/Plan Impression: Subcutaneous abscess, resolving. Colitis, alcohol abuse, cocaine abuse Plan: Will treat her wound locally as there is no fluctuance present for open draining. Will also treat her colitis. Will put her on DT precautions.  Will also treat her colitis.  Angel Price A 04/19/2013, 8:36 AM

## 2013-04-19 NOTE — Progress Notes (Signed)
UR completed. Patient changed to inpatient- requiring IVF @ 75cc/hr and IV pain medication

## 2013-04-20 MED ORDER — DOXYCYCLINE HYCLATE 100 MG PO CAPS: 100.0000 mg | ORAL_CAPSULE | Freq: Two times a day (BID) | ORAL | Status: DC

## 2013-04-20 MED ORDER — OXYCODONE-ACETAMINOPHEN 7.5-325 MG PO TABS: 1.0000 | ORAL_TABLET | ORAL | Status: DC | PRN

## 2013-04-20 NOTE — Plan of Care (Signed)
Problem: Discharge Progression Outcomes Goal: Other Discharge Outcomes/Goals Outcome: Completed/Met Date Met:  04/20/13 Pt has instructions to cleanse wound per Dr Arnoldo Morale

## 2013-04-20 NOTE — Discharge Summary (Signed)
Physician Discharge Summary  Patient ID: Jadah Bobak MRN: [id number] DOB/AGE: 1965-07-04 48 y.o.  Admit date: 04/18/2013 Discharge date: 04/20/2013  Admission Diagnoses: Cellulitis and abscess, abdominal wall  Discharge Diagnoses: Same Active Problems:   Abdominal wall abscess   Abscess of abdominal wall   Discharged Condition: good  Hospital Course: Patient is a 48 year old white female with a history of multiple abdominal surgeries in the past, status post incision and drainage of an abscess of the abdominal wall in 2014 who presented with increased swelling and drainage from a pinpoint opening. Cultures were negative for any organisms. She was started on antibiotic and her swelling has significantly resolved. There may be a factor of necrotic tissue present as she is multiple surgical scars present. She is being discharged home in good and improving condition.  Discharge Exam: Blood pressure 115/84, pulse 72, temperature 97.9 F (36.6 C), temperature source Oral, resp. rate 18, height 5\' 3"  (1.6 m), weight 62.279 kg (137 lb 4.8 oz), SpO2 98.00%. General appearance: alert, cooperative and no distress Resp: clear to auscultation bilaterally Cardio: regular rate and rhythm, S1, S2 normal, no murmur, click, rub or gallop GI: Soft. Minimal drainage from thickened area of skin just inferior to the umbilicus. No significant erythema noted.  Disposition: 01-Home or Self Care   Future Appointments Provider Department Dept Phone   04/21/2013 2:00 PM Doran Heater, MD [facility] [phone]   04/27/2013 9:30 AM Doran Heater, MD [facility] [phone]   05/05/2013 9:00 AM [facility] [phone]       Medication List         doxycycline 100 MG capsule  Commonly known as:  VIBRAMYCIN  Take 1 capsule (100 mg total) by mouth 2 (two) times daily.     lisinopril 10 MG tablet   Commonly known as:  PRINIVIL  Take 1 tablet (10 mg total) by mouth daily.     oxyCODONE-acetaminophen 7.5-325 MG per tablet  Commonly known as:  PERCOCET  Take 1-2 tablets by mouth every 4 (four) hours as needed.     senna-docusate 8.6-50 MG per tablet  Commonly known as:  Senokot-S  Take 2 tablets by mouth daily. With 8 oz of water     Vitamin D (Ergocalciferol) 50000 UNITS Caps capsule  Commonly known as:  DRISDOL  Take 50,000 Units by mouth 2 (two) times a week. Takes on Wednesday and Saturday.           Follow-up Information   Follow up with Jamesetta So, MD. Schedule an appointment as soon as possible for a visit on 04/29/2013.   Specialty:  General Surgery   Contact information:   [address] [place] [address] [phone]       Signed: Aviva Signs A 04/20/2013, 8:53 AM

## 2013-04-20 NOTE — Discharge Instructions (Signed)
Clean wound twice a day with soap and water.  Abscess An abscess (boil or furuncle) is an infected area on or under the skin. This area is filled with yellowish-white fluid (pus) and other material (debris). HOME CARE   Only take medicines as told by your doctor.  If you were given antibiotic medicine, take it as directed. Finish the medicine even if you start to feel better.  If gauze is used, follow your doctor's directions for changing the gauze.  To avoid spreading the infection:  Keep your abscess covered with a bandage.  Wash your hands well.  Do not share personal care items, towels, or whirlpools with others.  Avoid skin contact with others.  Keep your skin and clothes clean around the abscess.  Keep all doctor visits as told. GET HELP RIGHT AWAY IF:   You have more pain, puffiness (swelling), or redness in the wound site.  You have more fluid or blood coming from the wound site.  You have muscle aches, chills, or you feel sick.  You have a fever. MAKE SURE YOU:   Understand these instructions.  Will watch your condition.  Will get help right away if you are not doing well or get worse. Document Released: 08/21/2007 Document Revised: 09/03/2011 Document Reviewed: 05/17/2011 [facility] Patient Information 2014 [place].

## 2013-04-21 ENCOUNTER — Ambulatory Visit: Payer: Medicare Other | Admitting: Family Medicine

## 2013-04-21 ENCOUNTER — Telehealth: Payer: Self-pay | Admitting: *Deleted

## 2013-04-21 NOTE — Telephone Encounter (Signed)
Message copied by [facility], Louis Meckel on Wed Apr 21, 2013  2:19 PM ------      Message from: Doran Heater      Created: Mon Apr 19, 2013  8:33 AM       Please let pt know her holter monitor did show some fast heartbeat runs but they are not dangerous. If new sx arise she should let us know. I'll see her for f/u. ------

## 2013-04-21 NOTE — Telephone Encounter (Signed)
Pt notified and appreciative.

## 2013-04-21 NOTE — Progress Notes (Signed)
See telephone encounter.

## 2013-04-22 DIAGNOSIS — T8189XA Other complications of procedures, not elsewhere classified, initial encounter: Secondary | ICD-10-CM | POA: Diagnosis not present

## 2013-04-22 DIAGNOSIS — M329 Systemic lupus erythematosus, unspecified: Secondary | ICD-10-CM | POA: Diagnosis not present

## 2013-04-22 DIAGNOSIS — L02219 Cutaneous abscess of trunk, unspecified: Secondary | ICD-10-CM | POA: Diagnosis not present

## 2013-04-22 DIAGNOSIS — T8131XA Disruption of external operation (surgical) wound, not elsewhere classified, initial encounter: Secondary | ICD-10-CM | POA: Diagnosis not present

## 2013-04-26 DIAGNOSIS — M329 Systemic lupus erythematosus, unspecified: Secondary | ICD-10-CM | POA: Diagnosis not present

## 2013-04-26 DIAGNOSIS — T8131XA Disruption of external operation (surgical) wound, not elsewhere classified, initial encounter: Secondary | ICD-10-CM | POA: Diagnosis not present

## 2013-04-26 DIAGNOSIS — T8189XA Other complications of procedures, not elsewhere classified, initial encounter: Secondary | ICD-10-CM | POA: Diagnosis not present

## 2013-04-26 DIAGNOSIS — L02219 Cutaneous abscess of trunk, unspecified: Secondary | ICD-10-CM | POA: Diagnosis not present

## 2013-04-27 ENCOUNTER — Ambulatory Visit (INDEPENDENT_AMBULATORY_CARE_PROVIDER_SITE_OTHER): Payer: Medicare Other | Admitting: Family Medicine

## 2013-04-27 ENCOUNTER — Encounter: Payer: Self-pay | Admitting: Family Medicine

## 2013-04-27 VITALS — BP 120/82 | HR 118 | Temp 98.6°F | Resp 20 | Ht 62.5 in | Wt 136.2 lb

## 2013-04-27 DIAGNOSIS — R51 Headache: Secondary | ICD-10-CM | POA: Diagnosis not present

## 2013-04-27 DIAGNOSIS — Z720 Tobacco use: Secondary | ICD-10-CM

## 2013-04-27 DIAGNOSIS — L03319 Cellulitis of trunk, unspecified: Secondary | ICD-10-CM | POA: Diagnosis not present

## 2013-04-27 DIAGNOSIS — L02219 Cutaneous abscess of trunk, unspecified: Secondary | ICD-10-CM

## 2013-04-27 DIAGNOSIS — L02211 Cutaneous abscess of abdominal wall: Secondary | ICD-10-CM

## 2013-04-27 DIAGNOSIS — R519 Headache, unspecified: Secondary | ICD-10-CM

## 2013-04-27 DIAGNOSIS — F17200 Nicotine dependence, unspecified, uncomplicated: Secondary | ICD-10-CM

## 2013-04-27 DIAGNOSIS — I10 Essential (primary) hypertension: Secondary | ICD-10-CM

## 2013-04-27 DIAGNOSIS — F1411 Cocaine abuse, in remission: Secondary | ICD-10-CM

## 2013-04-27 NOTE — Progress Notes (Signed)
   Subjective:    Patient ID: Angel Price, female    DOB: 08-05-1965, 48 y.o.   MRN: [id number]  HPI Recently released from hospital - abd wall abscess. See notes.   Abscess - following w Dr. Arnoldo Morale. Doing well. Minimal pain, no fevers, no side effects from doxy. Good PO.   HTN - BP at goal today. Headaches have resolved. On 10mg  lisinopril. No CP, shob, blurred vision.   Tobacco - still smoking 1/2-1ppd. Says would like to quit but doesn't want to put effort in to quit. Not convinced tobacco plays role in othe rhealth issues.   H/o polysub - says is not using any other drugs. Says still no use of cocaine.    Review of Systems A 12 point review of systems is negative except as per hpi.      Objective:   Physical Exam Nursing note and vitals reviewed. Constitutional: She is oriented to person, place, and time. She appears well-developed and well-nourished.  HENT:  Right Ear: External ear normal.  Left Ear: External ear normal.  Nose: Nose normal.  Mouth/Throat: Oropharynx is clear and moist. No oropharyngeal exudate.  Eyes: Conjunctivae are normal. Pupils are equal, round, and reactive to light.  Neck: Normal range of motion. Neck supple. No thyromegaly present.  Cardiovascular: Normal rate, regular rhythm and normal heart sounds.   Pulmonary/Chest: Effort normal and breath sounds normal.  Abdominal: Soft. Bowel sounds are normal. She exhibits no distension. There is mild ttp over area of infection. There is no rebound.  Lymphadenopathy:    She has no cervical adenopathy.  Neurological: She is alert and oriented to person, place, and time. She has normal reflexes.  Skin: Skin is warm and dry.  Psychiatric: She has a normal mood and affect. Her behavior is normal.         Assessment & Plan:  Takima was seen today for follow-up.  Diagnoses and associated orders for this visit:  Essential hypertension, benign - cont lisinopril. Labs 1 week ago wnl Abdominal wall  abscess - f/u w surgery Frequent headaches - resolved, cont hydration. Likely related to htn Tobacco abuse - precontemplative about quitting. Provided 5 min cessation counselling today.  History of cocaine abuse - cont to abstain.   Continues to decline pelvic exam.   F/u 6 mos, earlier if needed

## 2013-04-28 DIAGNOSIS — T8189XA Other complications of procedures, not elsewhere classified, initial encounter: Secondary | ICD-10-CM | POA: Diagnosis not present

## 2013-04-28 DIAGNOSIS — L03319 Cellulitis of trunk, unspecified: Secondary | ICD-10-CM | POA: Diagnosis not present

## 2013-04-28 DIAGNOSIS — M329 Systemic lupus erythematosus, unspecified: Secondary | ICD-10-CM | POA: Diagnosis not present

## 2013-04-28 DIAGNOSIS — T8131XA Disruption of external operation (surgical) wound, not elsewhere classified, initial encounter: Secondary | ICD-10-CM | POA: Diagnosis not present

## 2013-04-28 DIAGNOSIS — L02219 Cutaneous abscess of trunk, unspecified: Secondary | ICD-10-CM | POA: Diagnosis not present

## 2013-04-29 DIAGNOSIS — L03319 Cellulitis of trunk, unspecified: Secondary | ICD-10-CM | POA: Diagnosis not present

## 2013-04-29 DIAGNOSIS — L02219 Cutaneous abscess of trunk, unspecified: Secondary | ICD-10-CM | POA: Diagnosis not present

## 2013-04-30 DIAGNOSIS — L03319 Cellulitis of trunk, unspecified: Secondary | ICD-10-CM | POA: Diagnosis not present

## 2013-04-30 DIAGNOSIS — T8189XA Other complications of procedures, not elsewhere classified, initial encounter: Secondary | ICD-10-CM | POA: Diagnosis not present

## 2013-04-30 DIAGNOSIS — L02219 Cutaneous abscess of trunk, unspecified: Secondary | ICD-10-CM | POA: Diagnosis not present

## 2013-04-30 DIAGNOSIS — M329 Systemic lupus erythematosus, unspecified: Secondary | ICD-10-CM | POA: Diagnosis not present

## 2013-04-30 DIAGNOSIS — T8131XA Disruption of external operation (surgical) wound, not elsewhere classified, initial encounter: Secondary | ICD-10-CM | POA: Diagnosis not present

## 2013-05-04 ENCOUNTER — Emergency Department (HOSPITAL_COMMUNITY)
Admission: EM | Admit: 2013-05-04 | Discharge: 2013-05-04 | Disposition: A | Discharge status: Home / Self Care | Payer: Medicare Other | Attending: Emergency Medicine | Admitting: Emergency Medicine

## 2013-05-04 ENCOUNTER — Emergency Department (HOSPITAL_COMMUNITY): Payer: Medicare Other

## 2013-05-04 ENCOUNTER — Encounter (HOSPITAL_COMMUNITY): Payer: Self-pay | Admitting: Emergency Medicine

## 2013-05-04 DIAGNOSIS — T8131XA Disruption of external operation (surgical) wound, not elsewhere classified, initial encounter: Secondary | ICD-10-CM | POA: Insufficient documentation

## 2013-05-04 DIAGNOSIS — Z9089 Acquired absence of other organs: Secondary | ICD-10-CM | POA: Diagnosis not present

## 2013-05-04 DIAGNOSIS — Z8739 Personal history of other diseases of the musculoskeletal system and connective tissue: Secondary | ICD-10-CM | POA: Diagnosis not present

## 2013-05-04 DIAGNOSIS — Z90710 Acquired absence of both cervix and uterus: Secondary | ICD-10-CM | POA: Diagnosis not present

## 2013-05-04 DIAGNOSIS — Z79899 Other long term (current) drug therapy: Secondary | ICD-10-CM | POA: Insufficient documentation

## 2013-05-04 DIAGNOSIS — R000 Tachycardia, unspecified: Secondary | ICD-10-CM | POA: Insufficient documentation

## 2013-05-04 DIAGNOSIS — Z862 Personal history of diseases of the blood and blood-forming organs and certain disorders involving the immune mechanism: Secondary | ICD-10-CM | POA: Insufficient documentation

## 2013-05-04 DIAGNOSIS — K838 Other specified diseases of biliary tract: Secondary | ICD-10-CM | POA: Diagnosis not present

## 2013-05-04 DIAGNOSIS — Z8619 Personal history of other infectious and parasitic diseases: Secondary | ICD-10-CM

## 2013-05-04 DIAGNOSIS — R112 Nausea with vomiting, unspecified: Secondary | ICD-10-CM | POA: Diagnosis not present

## 2013-05-04 DIAGNOSIS — Y838 Other surgical procedures as the cause of abnormal reaction of the patient, or of later complication, without mention of misadventure at the time of the procedure: Secondary | ICD-10-CM | POA: Insufficient documentation

## 2013-05-04 DIAGNOSIS — Z872 Personal history of diseases of the skin and subcutaneous tissue: Secondary | ICD-10-CM | POA: Insufficient documentation

## 2013-05-04 DIAGNOSIS — Z9884 Bariatric surgery status: Secondary | ICD-10-CM | POA: Insufficient documentation

## 2013-05-04 DIAGNOSIS — F17200 Nicotine dependence, unspecified, uncomplicated: Secondary | ICD-10-CM | POA: Insufficient documentation

## 2013-05-04 DIAGNOSIS — Z8639 Personal history of other endocrine, nutritional and metabolic disease: Secondary | ICD-10-CM | POA: Insufficient documentation

## 2013-05-04 DIAGNOSIS — I10 Essential (primary) hypertension: Secondary | ICD-10-CM | POA: Diagnosis not present

## 2013-05-04 DIAGNOSIS — R1110 Vomiting, unspecified: Secondary | ICD-10-CM

## 2013-05-04 LAB — CBC WITH DIFFERENTIAL/PLATELET
BASOS PCT: 0 % (ref 0–1)
Basophils Absolute: 0 10*3/uL (ref 0.0–0.1)
EOS ABS: 0.1 10*3/uL (ref 0.0–0.7)
EOS PCT: 1 % (ref 0–5)
HCT: 34.4 % — ABNORMAL LOW (ref 36.0–46.0)
Hemoglobin: 12.2 g/dL (ref 12.0–15.0)
Lymphocytes Relative: 36 % (ref 12–46)
Lymphs Abs: 2.4 10*3/uL (ref 0.7–4.0)
MCH: 30.6 pg (ref 26.0–34.0)
MCHC: 35.5 g/dL (ref 30.0–36.0)
MCV: 86.2 fL (ref 78.0–100.0)
MONOS PCT: 10 % (ref 3–12)
Monocytes Absolute: 0.7 10*3/uL (ref 0.1–1.0)
NEUTROS PCT: 53 % (ref 43–77)
Neutro Abs: 3.6 10*3/uL (ref 1.7–7.7)
Platelets: 306 10*3/uL (ref 150–400)
RBC: 3.99 MIL/uL (ref 3.87–5.11)
RDW: 14.7 % (ref 11.5–15.5)
WBC: 6.7 10*3/uL (ref 4.0–10.5)

## 2013-05-04 LAB — CULTURE, ROUTINE-ABSCESS

## 2013-05-04 LAB — COMPREHENSIVE METABOLIC PANEL
ALBUMIN: 3.6 g/dL (ref 3.5–5.2)
ALK PHOS: 92 U/L (ref 39–117)
ALT: 49 U/L — ABNORMAL HIGH (ref 0–35)
AST: 51 U/L — ABNORMAL HIGH (ref 0–37)
BUN: 5 mg/dL — AB (ref 6–23)
CALCIUM: 9.5 mg/dL (ref 8.4–10.5)
CO2: 24 mEq/L (ref 19–32)
CREATININE: 0.55 mg/dL (ref 0.50–1.10)
Chloride: 98 mEq/L (ref 96–112)
GFR calc non Af Amer: >90 mL/min (ref >90)
GLUCOSE: 103 mg/dL — AB (ref 70–99)
POTASSIUM: 3.5 meq/L — AB (ref 3.7–5.3)
Sodium: 136 mEq/L — ABNORMAL LOW (ref 137–147)
TOTAL PROTEIN: 7.6 g/dL (ref 6.0–8.3)
Total Bilirubin: 0.2 mg/dL — ABNORMAL LOW (ref 0.3–1.2)

## 2013-05-04 MED ORDER — SODIUM CHLORIDE 0.9 % IV BOLUS (SEPSIS)
1000.0000 mL | Freq: Once | INTRAVENOUS | Status: AC
  Administered 2013-05-04: 1000 mL via INTRAVENOUS

## 2013-05-04 MED ORDER — OXYCODONE-ACETAMINOPHEN 5-325 MG PO TABS: 1.0000 | ORAL_TABLET | Freq: Four times a day (QID) | ORAL | Status: DC | PRN

## 2013-05-04 MED ORDER — ONDANSETRON HCL 4 MG/2ML IJ SOLN
4.0000 mg | Freq: Once | INTRAMUSCULAR | Status: AC
  Administered 2013-05-04: 4 mg via INTRAVENOUS

## 2013-05-04 MED ORDER — HYDROMORPHONE HCL PF 1 MG/ML IJ SOLN
0.5000 mg | Freq: Once | INTRAMUSCULAR | Status: AC
  Administered 2013-05-04: 0.5 mg via INTRAVENOUS
  Filled 2013-05-04: qty 1

## 2013-05-04 MED ORDER — HYDROMORPHONE HCL PF 1 MG/ML IJ SOLN
0.5000 mg | Freq: Once | INTRAMUSCULAR | Status: AC
Start: 2013-05-04 — End: 2013-05-04
  Administered 2013-05-04: 0.5 mg via INTRAVENOUS
  Filled 2013-05-04: qty 1

## 2013-05-04 MED ORDER — ONDANSETRON HCL 4 MG/2ML IJ SOLN
4.0000 mg | Freq: Once | INTRAMUSCULAR | Status: DC
  Filled 2013-05-04: qty 2

## 2013-05-04 MED ORDER — IOHEXOL 300 MG/ML  SOLN
100.0000 mL | Freq: Once | INTRAMUSCULAR | Status: AC | PRN
  Administered 2013-05-04: 100 mL via INTRAVENOUS

## 2013-05-04 NOTE — ED Notes (Signed)
Pt c/o n/v for a while and states she abd surgery in November and the site is not healed and busted open today.

## 2013-05-04 NOTE — ED Provider Notes (Signed)
CSN: [id number]     Arrival date & time 05/04/13  1943 History   First MD Initiated Contact with Patient 05/04/13 1952     Chief Complaint  Patient presents with  . Vomiting     (Consider location/radiation/quality/duration/timing/severity/associated sxs/prior Treatment) HPI  This is a 48 year old female with history of lupus, hypertension, multiple abdominal surgeries and recent debridement of abdominal wall abscess these who presents with nausea and vomiting. Patient reports one week history of nonbilious, nonbloody emesis. She has intermittently been able to tolerate by mouth. She denies any abdominal pain. Last normal bowel movement was today. She reports at baseline she has loose stools.  She denies any fevers. She noted today that her abdominal wound opened up with a large amount of "pus and infection all over my clothes."  Past Medical History  Diagnosis Date  . Lupus   . Thyroid disease   . Hypertension    Past Surgical History  Procedure Laterality Date  . Abdominal surgery    . Abdominal hysterectomy    . Debridement of abdominal wall abscess N/A 02/08/2013    Procedure: DEBRIDEMENT OF ABDOMINAL WALL ABSCESS;  Surgeon: Jamesetta So, MD;  Location: [facility];  Service: General;  Laterality: N/A;  . Gastric bypass  2000 and 2005   Family History  Problem Relation Age of Onset  . Cancer Son   . Cancer Maternal Aunt   . Cancer Maternal Grandmother    History  Substance Use Topics  . Smoking status: Current Every Day Smoker -- 1.00 packs/day    Types: Cigarettes  . Smokeless tobacco: Never Used  . Alcohol Use: Yes     Comment: beer or liquor daily until "buzz" at least. often in morning.    OB History   Grav Para Term Preterm Abortions TAB SAB Ect Mult Living                 Review of Systems  Constitutional: Negative for fever.  Respiratory: Negative for cough, chest tightness and shortness of breath.   Cardiovascular: Negative for chest pain.   Gastrointestinal: Positive for nausea and vomiting. Negative for abdominal pain, diarrhea and constipation.  Genitourinary: Negative for dysuria.  Musculoskeletal: Negative for back pain.  Skin: Positive for wound.  Neurological: Negative for headaches.  Psychiatric/Behavioral: Negative for confusion.  All other systems reviewed and are negative.      Allergies  Codeine and Morphine and related  Home Medications   Current Outpatient Rx  Name  Route  Sig  Dispense  Refill  . lisinopril (PRINIVIL) 10 MG tablet   Oral   Take 1 tablet (10 mg total) by mouth daily.   30 tablet   1   . oxyCODONE-acetaminophen (PERCOCET/ROXICET) 5-325 MG per tablet   Oral   Take 1 tablet by mouth every 6 (six) hours as needed for severe pain.   6 tablet   0    BP 135/102  Pulse 109  Temp(Src) 98.8 F (37.1 C)  Resp 18  Ht 5\' 3"  (1.6 m)  Wt 136 lb (61.689 kg)  BMI 24.10 kg/m2  SpO2 98% Physical Exam  Nursing note and vitals reviewed. Constitutional: She is oriented to person, place, and time. She appears well-developed and well-nourished. No distress.  HENT:  Head: Normocephalic and atraumatic.  Eyes: Pupils are equal, round, and reactive to light.  Neck: Neck supple.  Cardiovascular: Regular rhythm and normal heart sounds.   tachycardia  Pulmonary/Chest: Effort normal and breath sounds normal. No respiratory  distress. She has no wheezes.  Abdominal: Soft. Bowel sounds are normal. She exhibits no distension. There is no tenderness.  Extensive ventral abdominal scarring, 1 cm dehiscence noted just lateral and inferior to the umbilicus, serosanguineous drainage noted no fluctuance or overlying skin changes  Musculoskeletal: She exhibits no edema.  Neurological: She is alert and oriented to person, place, and time.  Skin: Skin is warm and dry.  Psychiatric: She has a normal mood and affect.    ED Course  Procedures (including critical care time) Labs Review Labs Reviewed  CBC  WITH DIFFERENTIAL - Abnormal; Notable for the following:    HCT 34.4 (*)    All other components within normal limits  COMPREHENSIVE METABOLIC PANEL - Abnormal; Notable for the following:    Sodium 136 (*)    Potassium 3.5 (*)    Glucose, Bld 103 (*)    BUN 5 (*)    AST 51 (*)    ALT 49 (*)    Total Bilirubin 0.2 (*)    All other components within normal limits   Imaging Review Ct Abdomen Pelvis W Contrast  05/04/2013   CLINICAL DATA:  Vomiting. History of gastric bypass. Open wound to abdomen from prior abdominal wall debridement.  EXAM: CT ABDOMEN AND PELVIS WITH CONTRAST  TECHNIQUE: Multidetector CT imaging of the abdomen and pelvis was performed using the standard protocol following bolus administration of intravenous contrast.  CONTRAST:  144mL OMNIPAQUE IOHEXOL 300 MG/ML  SOLN  COMPARISON:  04/29/2013  FINDINGS: Insert basis  Prior cholecystectomy. Prior gastric bypass. Extensive surgical clips around the right kidney, possibly from prior adrenalectomy. Mild biliary ductal dilatation, stable since prior study. Common bile duct measures up to 17 mm. No focal hepatic lesion. Mild intrahepatic biliary ductal dilatation. Spleen, pancreas, kidneys are unremarkable. Left adrenal nodule measures 2.0 cm and is stable. There is a dilated segment of small bowel in the left abdomen at the suture line, likely related to postsurgical denervation changes. No evidence of bowel obstruction. No free air. Large bowel grossly unremarkable.  Prior hysterectomy. No adnexal masses. Urinary bladder is unremarkable.  Aorta and iliac vessels are non aneurysmal. Surgical changes and stranding noted in the anterior abdominal wall. The previously seen fluid collections have decreased, but the at overall area of abnormal soft tissue is similar to prior study, measuring at 8.4 x 1.6 cm compared with 8.4 x 2.8 cm previously. However, internally within the soft tissue, there is decreased fluid.  No acute bony abnormality.   IMPRESSION: Extensive surgical changes in the stomach including cholecystectomy, gastric bypass, right adrenalectomy, and hysterectomy.  Continued abnormal soft tissue in the anterior abdominal wall in the area of debridement and prior fluid collection. Internal fluid density has decreased since prior study.  Mild intrahepatic and extrahepatic biliary duct dilatation, stable.   Electronically Signed   By: Rolm Baptise M.D.   On: 05/04/2013 21:30    EKG Interpretation   None       MDM   Final diagnoses:  Vomiting  History of surgical site infection    Patient presents with isolated vomiting and abdominal wall infection. She is nontoxic-appearing on exam. She is not actively vomiting. Vital signs are reassuring. Abdominal exam, patient is nontender but does have a small area of scar breakdown that has serosanguineous drainage from it. There are no overlying skin changes. She's been afebrile. Basic labwork was obtained. Patient is mildly hypokalemic. CT scan of the abdomen shows persistent abnormal abdominal wall changes; however, fluid  collections are decreased. Given the patient is nontoxic and has no overlying skin changes, will refer patient back to her primary surgeon for recheck tomorrow. Patient was able to tolerate by mouth and she has no evidence of peritonitis on exam. She's given strict return precautions.  After history, exam, and medical workup I feel the patient has been appropriately medically screened and is safe for discharge home. Pertinent diagnoses were discussed with the patient. Patient was given return precautions.     Merryl Hacker, MD 05/04/13 2211

## 2013-05-04 NOTE — Discharge Instructions (Signed)
Nausea and Vomiting Nausea is a sick feeling that often comes before throwing up (vomiting). Vomiting is a reflex where stomach contents come out of your mouth. Vomiting can cause severe loss of body fluids (dehydration). Children and elderly adults can become dehydrated quickly, especially if they also have diarrhea. Nausea and vomiting are symptoms of a condition or disease. It is important to find the cause of your symptoms.  It appear that you have drainage from your abdominal wound.  Your CT scan does not show any increasing fluid collections in the abdominal wall. You should keep your wound packed and covered. You need to follow up with your surgeon in 1 day for recheck. CAUSES   Direct irritation of the stomach lining. This irritation can result from increased acid production (gastroesophageal reflux disease), infection, food poisoning, taking certain medicines (such as nonsteroidal anti-inflammatory drugs), alcohol use, or tobacco use.  Signals from the brain.These signals could be caused by a headache, heat exposure, an inner ear disturbance, increased pressure in the brain from injury, infection, a tumor, or a concussion, pain, emotional stimulus, or metabolic problems.  An obstruction in the gastrointestinal tract (bowel obstruction).  Illnesses such as diabetes, hepatitis, gallbladder problems, appendicitis, kidney problems, cancer, sepsis, atypical symptoms of a heart attack, or eating disorders.  Medical treatments such as chemotherapy and radiation.  Receiving medicine that makes you sleep (general anesthetic) during surgery. DIAGNOSIS Your caregiver may ask for tests to be done if the problems do not improve after a few days. Tests may also be done if symptoms are severe or if the reason for the nausea and vomiting is not clear. Tests may include:  Urine tests.  Blood tests.  Stool tests.  Cultures (to look for evidence of infection).  X-rays or other imaging  studies. Test results can help your caregiver make decisions about treatment or the need for additional tests. TREATMENT You need to stay well hydrated. Drink frequently but in small amounts.You may wish to drink water, sports drinks, clear broth, or eat frozen ice pops or gelatin dessert to help stay hydrated.When you eat, eating slowly may help prevent nausea.There are also some antinausea medicines that may help prevent nausea. HOME CARE INSTRUCTIONS   Take all medicine as directed by your caregiver.  If you do not have an appetite, do not force yourself to eat. However, you must continue to drink fluids.  If you have an appetite, eat a normal diet unless your caregiver tells you differently.  Eat a variety of complex carbohydrates (rice, wheat, potatoes, bread), lean meats, yogurt, fruits, and vegetables.  Avoid high-fat foods because they are more difficult to digest.  Drink enough water and fluids to keep your urine clear or pale yellow.  If you are dehydrated, ask your caregiver for specific rehydration instructions. Signs of dehydration may include:  Severe thirst.  Dry lips and mouth.  Dizziness.  Dark urine.  Decreasing urine frequency and amount.  Confusion.  Rapid breathing or pulse. SEEK IMMEDIATE MEDICAL CARE IF:   You have blood or brown flecks (like coffee grounds) in your vomit.  You have black or bloody stools.  You have a severe headache or stiff neck.  You are confused.  You have severe abdominal pain.  You have chest pain or trouble breathing.  You do not urinate at least once every 8 hours.  You develop cold or clammy skin.  You continue to vomit for longer than 24 to 48 hours.  You have a fever. MAKE  SURE YOU:   Understand these instructions.  Will watch your condition.  Will get help right away if you are not doing well or get worse. Document Released: 03/04/2005 Document Revised: 05/27/2011 Document Reviewed:  08/01/2010 [facility] Patient Information 2014 [place], [place].

## 2013-05-05 ENCOUNTER — Ambulatory Visit: Payer: Medicare Other | Admitting: Gastroenterology

## 2013-05-05 DIAGNOSIS — T8131XA Disruption of external operation (surgical) wound, not elsewhere classified, initial encounter: Secondary | ICD-10-CM | POA: Diagnosis not present

## 2013-05-05 DIAGNOSIS — T8189XA Other complications of procedures, not elsewhere classified, initial encounter: Secondary | ICD-10-CM | POA: Diagnosis not present

## 2013-05-05 DIAGNOSIS — L02219 Cutaneous abscess of trunk, unspecified: Secondary | ICD-10-CM | POA: Diagnosis not present

## 2013-05-05 DIAGNOSIS — M329 Systemic lupus erythematosus, unspecified: Secondary | ICD-10-CM | POA: Diagnosis not present

## 2013-05-05 DIAGNOSIS — L03319 Cellulitis of trunk, unspecified: Secondary | ICD-10-CM | POA: Diagnosis not present

## 2013-05-06 DIAGNOSIS — T8189XA Other complications of procedures, not elsewhere classified, initial encounter: Secondary | ICD-10-CM | POA: Diagnosis not present

## 2013-05-06 DIAGNOSIS — L02219 Cutaneous abscess of trunk, unspecified: Secondary | ICD-10-CM | POA: Diagnosis not present

## 2013-05-06 DIAGNOSIS — T8131XA Disruption of external operation (surgical) wound, not elsewhere classified, initial encounter: Secondary | ICD-10-CM | POA: Diagnosis not present

## 2013-05-06 DIAGNOSIS — M329 Systemic lupus erythematosus, unspecified: Secondary | ICD-10-CM | POA: Diagnosis not present

## 2013-05-07 ENCOUNTER — Other Ambulatory Visit: Payer: Self-pay | Admitting: Internal Medicine

## 2013-05-07 ENCOUNTER — Encounter (HOSPITAL_COMMUNITY): Payer: Self-pay | Admitting: Pharmacy Technician

## 2013-05-07 ENCOUNTER — Encounter: Payer: Self-pay | Admitting: Gastroenterology

## 2013-05-07 ENCOUNTER — Ambulatory Visit (INDEPENDENT_AMBULATORY_CARE_PROVIDER_SITE_OTHER): Payer: Medicare Other | Admitting: Gastroenterology

## 2013-05-07 VITALS — BP 119/86 | HR 101 | Temp 98.3°F | Ht 60.0 in | Wt 141.8 lb

## 2013-05-07 DIAGNOSIS — L03319 Cellulitis of trunk, unspecified: Secondary | ICD-10-CM

## 2013-05-07 DIAGNOSIS — R112 Nausea with vomiting, unspecified: Secondary | ICD-10-CM | POA: Insufficient documentation

## 2013-05-07 DIAGNOSIS — R945 Abnormal results of liver function studies: Secondary | ICD-10-CM | POA: Insufficient documentation

## 2013-05-07 DIAGNOSIS — K625 Hemorrhage of anus and rectum: Secondary | ICD-10-CM

## 2013-05-07 DIAGNOSIS — R197 Diarrhea, unspecified: Secondary | ICD-10-CM | POA: Insufficient documentation

## 2013-05-07 DIAGNOSIS — E279 Disorder of adrenal gland, unspecified: Secondary | ICD-10-CM

## 2013-05-07 DIAGNOSIS — R7989 Other specified abnormal findings of blood chemistry: Secondary | ICD-10-CM

## 2013-05-07 DIAGNOSIS — L02211 Cutaneous abscess of abdominal wall: Secondary | ICD-10-CM

## 2013-05-07 DIAGNOSIS — F1010 Alcohol abuse, uncomplicated: Secondary | ICD-10-CM

## 2013-05-07 DIAGNOSIS — E278 Other specified disorders of adrenal gland: Secondary | ICD-10-CM | POA: Insufficient documentation

## 2013-05-07 DIAGNOSIS — L02219 Cutaneous abscess of trunk, unspecified: Secondary | ICD-10-CM

## 2013-05-07 LAB — IRON AND TIBC
%SAT: 30 % (ref 20–55)
Iron: 81 ug/dL (ref 42–145)
TIBC: 269 ug/dL (ref 250–470)
UIBC: 188 ug/dL (ref 125–400)

## 2013-05-07 LAB — HEPATITIS C ANTIBODY: HCV AB: NEGATIVE

## 2013-05-07 LAB — HEPATITIS B SURFACE ANTIGEN: Hepatitis B Surface Ag: NEGATIVE

## 2013-05-07 LAB — HEPATIC FUNCTION PANEL
ALT: 45 U/L — ABNORMAL HIGH (ref 0–35)
AST: 39 U/L — ABNORMAL HIGH (ref 0–37)
Albumin: 3.8 g/dL (ref 3.5–5.2)
Alkaline Phosphatase: 76 U/L (ref 39–117)
BILIRUBIN DIRECT: 0.1 mg/dL (ref 0.0–0.3)
BILIRUBIN INDIRECT: 0.2 mg/dL (ref 0.2–1.2)
BILIRUBIN TOTAL: 0.3 mg/dL (ref 0.2–1.2)
Total Protein: 6.7 g/dL (ref 6.0–8.3)

## 2013-05-07 LAB — FERRITIN: Ferritin: 106 ng/mL (ref 10–291)

## 2013-05-07 MED ORDER — PEG 3350-KCL-NA BICARB-NACL 420 G PO SOLR: 4000.0000 mL | ORAL | Status: DC

## 2013-05-07 MED ORDER — ONDANSETRON HCL 4 MG PO TABS: 4.0000 mg | ORAL_TABLET | Freq: Four times a day (QID) | ORAL | Status: DC | PRN

## 2013-05-07 NOTE — Assessment & Plan Note (Addendum)
Mildly elevated AST/ALT. Biliary dilation on CT. She has multiple risk factors for viral hepatitis. She has fatty liver and ongoing etoh use. Recommend viral markers, iron/tibc, ferritin. At some point, she will need MRCP for further evaluation of biliary dilation. Discussed etoh cessation. Denies needing help.

## 2013-05-07 NOTE — Patient Instructions (Addendum)
1. Please have your labs and stool test done as soon as possible. 2. Colonoscopy and upper endoscopy with Dr. Gala Romney. See separate instructions.  3. Prescription for zofran for nausea/vomiting sent to your pharmacy. 4. You need to cut way back on your alcohol consumption. You have evidence of fatty liver on your xrays and are at increased risk of developing cirrhosis.  Fatty Liver Fatty liver is the accumulation of fat in liver cells. It is also called hepatosteatosis or steatohepatitis. It is normal for your liver to contain some fat. If fat is more than 5 to 10% of your liver's weight, you have fatty liver.  There are often no symptoms (problems) for years while damage is still occurring. People often learn about their fatty liver when they have medical tests for other reasons. Fat can damage your liver for years or even decades without causing problems. When it becomes severe, it can cause fatigue, weight loss, weakness, and confusion. This makes you more likely to develop more serious liver problems. The liver is the largest organ in the body. It does a lot of work and often gives no warning signs when it is sick until late in a disease. The liver has many important jobs including:  Breaking down foods.  Storing vitamins, iron, and other minerals.  Making proteins.  Making bile for food digestion.  Breaking down many products including medications, alcohol and some poisons. CAUSES  There are a number of different conditions, medications, and poisons that can cause a fatty liver. Eating too many calories causes fat to build up in the liver. Not processing and breaking fats down normally may also cause this. Certain conditions, such as obesity, diabetes, and high triglycerides also cause this. Most fatty liver patients tend to be middle-aged and over weight.  Some causes of fatty liver are:  Alcohol over consumption.  Malnutrition.  Steroid use.  Valproic acid  toxicity.  Obesity.  Cushing's syndrome.  Poisons.  Tetracycline in high dosages.  Pregnancy.  Diabetes.  Hyperlipidemia.  Rapid weight loss. Some people develop fatty liver even having none of these conditions. SYMPTOMS  Fatty liver most often causes no problems. This is called asymptomatic.  It can be diagnosed with blood tests and also by a liver biopsy.  It is one of the most common causes of minor elevations of liver enzymes on routine blood tests.  Specialized Imaging of the liver using ultrasound, CT (computed tomography) scan, or MRI (magnetic resonance imaging) can suggest a fatty liver but a biopsy is needed to confirm it.  A biopsy involves taking a small sample of liver tissue. This is done by using a needle. It is then looked at under a microscope by a specialist. TREATMENT  It is important to treat the cause. Simple fatty liver without a medical reason may not need treatment.  Weight loss, fat restriction, and exercise in overweight patients produces inconsistent results but is worth trying.  Fatty liver due to alcohol toxicity may not improve even with stopping drinking.  Good control of diabetes may reduce fatty liver.  Lower your triglycerides through diet, medication or both.  Eat a balanced, healthy diet.  Increase your physical activity.  Get regular checkups from a liver specialist.  There are no medical or surgical treatments for a fatty liver or NASH, but improving your diet and increasing your exercise may help prevent or reverse some of the damage. PROGNOSIS  Fatty liver may cause no damage or it can lead to an inflammation  of the liver. This is, called steatohepatitis. When it is linked to alcohol abuse, it is called alcoholic steatohepatitis. It often is not linked to alcohol. It is then called nonalcoholic steatohepatitis, or NASH. Over time the liver may become scarred and hardened. This condition is called cirrhosis. Cirrhosis is serious  and may lead to liver failure or cancer. NASH is one of the leading causes of cirrhosis. About 10-20% of Americans have fatty liver and a smaller 2-5% has NASH. Document Released: 04/19/2005 Document Revised: 05/27/2011 Document Reviewed: 06/12/2005 [facility] Patient Information 2014 [place].

## 2013-05-07 NOTE — Assessment & Plan Note (Signed)
Need MRI, adrenal protocol. ?may incorporate with MRCP?

## 2013-05-07 NOTE — Assessment & Plan Note (Signed)
Several week history of diarrhea, rectal bleeding. CT 04/18/13 showed extensive colitis but no evidence on last CT. Significant abdominal wall infection as noted on CTs, currently on antibiotics, followed by Dr. Arnoldo Morale. Discussed with Dr. Gala Romney. Will proceed with colonoscopy along with EGD (recurrent N/V) in OR due to daily etoh use.  I have discussed the risks, alternatives, benefits with regards to but not limited to the risk of reaction to medication, bleeding, infection, perforation and the patient is agreeable to proceed. Written consent to be obtained.   Check for C.Diff.

## 2013-05-07 NOTE — Progress Notes (Signed)
Primary Care Physician:  Doran Heater, MD  Primary Gastroenterologist:  Garfield Cornea, MD  Chief Complaint  Patient presents with  . Rectal Bleeding    HPI:  Angel Price is a 48 y.o. female here for further evaluation of rectal bleeding and several week history of diarrhea. She was evaluated in the ER twice back in 03/2013. Admitted 04/18/13 with abdominal wall abscess. Two CTs as outlined below. On first CT she had extensive colitis noted.  No stool studies done to date.  She has been on antibiotics for abdominal wall abscesses. Currently on Bactrim. She continues to have discomfort related to this. Diarrhea for weeks. BM 1-6 per day. Some nocturnal diarrhea. Last rectal bleeding about two weeks ago. Moderate brbpr. No blood clots. No melena. No heartburn. Some vomiting almost daily. Some small volume/streaks of hematemesis. No significant weight loss.   She has mild bump in AST/ALT. She was noted to have biliary dilation on CT. She has had numerous surgeries with "gastric bypass" for PUD in 2000. Reports having numerous surgeries related to this since that time. H/O tatoos, blood transfusion in 2005. H/O intranasal cocaine. None in six months. Ongoing etoh abuse. Drinks daily, to get a buzz.  Weighed more than 300 pounds before her gastric surgery, did not have done for weight loss purposes however.    Current Outpatient Prescriptions  Medication Sig Dispense Refill  . doxycycline (VIBRA-TABS) 100 MG tablet       . lisinopril (PRINIVIL) 10 MG tablet Take 1 tablet (10 mg total) by mouth daily.  30 tablet  1  . oxyCODONE-acetaminophen (PERCOCET) 7.5-325 MG per tablet Take 1 tablet by mouth every 6 (six) hours as needed.       . sulfamethoxazole-trimethoprim (BACTRIM DS) 800-160 MG per tablet Take 1 tablet by mouth 2 (two) times daily.       No current facility-administered medications for this visit.    Allergies as of 05/07/2013 - Review Complete 05/07/2013  Allergen Reaction Noted   . Codeine  02/06/2013  . Morphine and related  02/06/2013    Past Medical History  Diagnosis Date  . Lupus   . Thyroid disease   . Hypertension     Past Surgical History  Procedure Laterality Date  . Abdominal surgery    . Abdominal hysterectomy    . Debridement of abdominal wall abscess N/A 02/08/2013    Procedure: DEBRIDEMENT OF ABDOMINAL WALL ABSCESS;  Surgeon: Jamesetta So, MD;  Location: [facility];  Service: General;  Laterality: N/A;  . Gastric bypass  2000 and 2005  . Colonoscopy      in [place]    Family History  Problem Relation Age of Onset  . Cancer Son   . Cancer Maternal Aunt   . Cancer Maternal Grandmother   . Colon cancer Maternal Grandmother     History   Social History  . Marital Status: Married    Spouse Name: N/A    Number of Children: N/A  . Years of Education: N/A   Occupational History  . Not on file.   Social History Main Topics  . Smoking status: Current Every Day Smoker -- 1.00 packs/day    Types: Cigarettes  . Smokeless tobacco: Never Used  . Alcohol Use: Yes     Comment: beer or liquor daily until "buzz" at least. often in morning.   . Drug Use: Yes    Special: Cocaine     Comment: quit 09/2012  . Sexual Activity: Yes  Birth Control/ Protection: Surgical   Other Topics Concern  . Not on file   Social History Narrative  . No narrative on file      ROS:  General: Negative for anorexia, weight loss, fever, chills, fatigue, weakness. Eyes: Negative for vision changes.  ENT: Negative for hoarseness, difficulty swallowing , nasal congestion. CV: Negative for chest pain, angina, palpitations, dyspnea on exertion, peripheral edema.  Respiratory: Negative for dyspnea at rest, dyspnea on exertion, cough, sputum, wheezing.  GI: See history of present illness. GU:  Negative for dysuria, hematuria, urinary incontinence, urinary frequency, nocturnal urination.  MS: Negative for joint pain, low back pain.  Derm: Negative for rash  or itching.  Neuro: Negative for weakness, abnormal sensation, seizure, frequent headaches, memory loss, confusion.  Psych: Negative for anxiety, suicidal ideation, hallucinations. Situational depression. Endo: Negative for unusual weight change.  Heme: Negative for bruising or bleeding. Allergy: Negative for rash or hives.    Physical Examination:  BP 119/86  Pulse 101  Temp(Src) 98.3 F (36.8 C) (Oral)  Ht 5' (1.524 m)  Wt 141 lb 12.8 oz (64.32 kg)  BMI 27.69 kg/m2   General: Well-nourished, well-developed in no acute distress.  Head: Normocephalic, atraumatic.   Eyes: Conjunctiva pink, no icterus. Mouth: Oropharyngeal mucosa moist and pink , no lesions erythema or exudate. Neck: Supple without thyromegaly, masses, or lymphadenopathy.  Lungs: Clear to auscultation bilaterally.  Heart: Regular rate and rhythm, no murmurs rubs or gallops.  Abdomen: Bowel sounds are normal, mild abdominal wall tenderness at site of wound, nondistended, no hepatosplenomegaly or masses, no abdominal bruits or    hernia , no rebound or guarding.   Rectal: not performed Extremities: No lower extremity edema. No clubbing or deformities.  Neuro: Alert and oriented x 4 , grossly normal neurologically.  Skin: Warm and dry, no rash or jaundice.   Psych: Alert and cooperative, normal mood and affect.  Labs: Lab Results  Component Value Date   WBC 6.7 05/04/2013   HGB 12.2 05/04/2013   HCT 34.4* 05/04/2013   MCV 86.2 05/04/2013   PLT 306 05/04/2013   Lab Results  Component Value Date   CREATININE 0.55 05/04/2013   BUN 5* 05/04/2013   NA 136* 05/04/2013   K 3.5* 05/04/2013   CL 98 05/04/2013   CO2 24 05/04/2013   Lab Results  Component Value Date   ALT 49* 05/04/2013   AST 51* 05/04/2013   ALKPHOS 92 05/04/2013   BILITOT 0.2* 05/04/2013     Imaging Studies: Ct Abdomen Pelvis W Contrast  05/04/2013   CLINICAL DATA:  Vomiting. History of gastric bypass. Open wound to abdomen from prior abdominal wall  debridement.  EXAM: CT ABDOMEN AND PELVIS WITH CONTRAST  TECHNIQUE: Multidetector CT imaging of the abdomen and pelvis was performed using the standard protocol following bolus administration of intravenous contrast.  CONTRAST:  177mL OMNIPAQUE IOHEXOL 300 MG/ML  SOLN  COMPARISON:  04/29/2013  FINDINGS: Insert basis  Prior cholecystectomy. Prior gastric bypass. Extensive surgical clips around the right kidney, possibly from prior adrenalectomy. Mild biliary ductal dilatation, stable since prior study. Common bile duct measures up to 17 mm. No focal hepatic lesion. Mild intrahepatic biliary ductal dilatation. Spleen, pancreas, kidneys are unremarkable. Left adrenal nodule measures 2.0 cm and is stable. There is a dilated segment of small bowel in the left abdomen at the suture line, likely related to postsurgical denervation changes. No evidence of bowel obstruction. No free air. Large bowel grossly unremarkable.  Prior hysterectomy.  No adnexal masses. Urinary bladder is unremarkable.  Aorta and iliac vessels are non aneurysmal. Surgical changes and stranding noted in the anterior abdominal wall. The previously seen fluid collections have decreased, but the at overall area of abnormal soft tissue is similar to prior study, measuring at 8.4 x 1.6 cm compared with 8.4 x 2.8 cm previously. However, internally within the soft tissue, there is decreased fluid.  No acute bony abnormality.  IMPRESSION: Extensive surgical changes in the stomach including cholecystectomy, gastric bypass, right adrenalectomy, and hysterectomy.  Continued abnormal soft tissue in the anterior abdominal wall in the area of debridement and prior fluid collection. Internal fluid density has decreased since prior study.  Mild intrahepatic and extrahepatic biliary duct dilatation, stable.   Electronically Signed   By: Rolm Baptise M.D.   On: 05/04/2013 21:30   Ct Abdomen Pelvis W Contrast  04/18/2013   CLINICAL DATA:  History of lupus and thyroid  disease with abdominal pain and swelling and redness inferior and lateral to the umbilicus  EXAM: CT ABDOMEN AND PELVIS WITH CONTRAST  TECHNIQUE: Multidetector CT imaging of the abdomen and pelvis was performed using the standard protocol following bolus administration of intravenous contrast.  CONTRAST:  39mL OMNIPAQUE IOHEXOL 300 MG/ML SOLN, 140mL OMNIPAQUE IOHEXOL 300 MG/ML SOLN  COMPARISON:  None.  FINDINGS: Visualized lung bases clear. Mild atelectatic change the left lung base.  Significant diffuse fatty infiltration of the liver. No focal hepatic abnormalities. Gallbladder is surgically absent. Spleen is normal. Pancreas is normal. Mild dilatation of the common bile duct is likely related to prior cholecystectomy. There is a left adrenal mass. It measures 24 x 21 mm. It demonstrates an average attenuation value of 56. Right adrenal gland appears to be surgically absent. Right kidney is normal. Left kidney is normal. There is mild dilatation of left ureter as compared to the right but there is no stone or perinephric inflammatory change.  Bladder is normal. There is wall thickening involving the rectum, sigmoid colon, and descending colon. The transverse colon and ascending colon as well as the cecum shows similar up mild to moderate diffuse wall thickening.  Inferior to the umbilicus, there is a rim enhancing fluid collection in the left subcutaneous soft tissues, measuring 28 x 25 mm. This associated with diffuse infraumbilical midline wall thickening and a band of inflammatory change extending to the anterior abdominal wall, measuring about 3.8 cm. The entire presumably inflammatory process measures 84 x 28 mm. There may be a tiny focus of gas chest inside the anterior abdominal wall to the left of midline at this level seen on image number 53. Across the abdominal wall, inside the peritoneal cavity adjacent to this inflammatory process, there are several loops of otherwise normal appearing small bowel  located anteriorly against the anterior abdominal wall.  IMPRESSION: 1. Moderately severe diffuse colon wall thickening consistent with diffuse colitis 2. Inflammatory change in the infraumbilical abdominal wall in the midline and to the left of midline. There appears to be at least 1 abscess cavity towards the left aspect of this process, measuring 2.8 x 2.5 cm. Etiology is uncertain. This process extends down to the deep fascia of the abdominal wall. Across the abdominal wall within the adjacent peritoneum, there are numerous loops of otherwise normal appearing small bowel. There is an equivocal tiny focus of gas anteriorly within the peritoneum at this level. Bowel perforation with spread of infection to the anterior abdominal wall is not excluded. It is also possible this represents  a superficial infection that spreads down to the deep subcutaneous soft tissues but does not involve bowel. Note that the inflamed: Described above does not pass under knee the subcutaneous inflammation and it appears to be a separate process. 3. Indeterminate left adrenal mass. Recommend nonemergent further characterization with adrenal protocol MRI.   Electronically Signed   By: Skipper Cliche M.D.   On: 04/18/2013 22:08

## 2013-05-07 NOTE — Assessment & Plan Note (Signed)
Etiology not clear. Needs evaluation given ongoing symptoms and h/o hematemesis. Her detailed, post-surgical anatomy is unknown. Plan on EGD in OR due to etoh abuse.  I have discussed the risks, alternatives, benefits with regards to but not limited to the risk of reaction to medication, bleeding, infection, perforation and the patient is agreeable to proceed. Written consent to be obtained.  Await findings, may need PPI.

## 2013-05-11 DIAGNOSIS — T8131XA Disruption of external operation (surgical) wound, not elsewhere classified, initial encounter: Secondary | ICD-10-CM | POA: Diagnosis not present

## 2013-05-11 DIAGNOSIS — L02219 Cutaneous abscess of trunk, unspecified: Secondary | ICD-10-CM | POA: Diagnosis not present

## 2013-05-11 DIAGNOSIS — T8189XA Other complications of procedures, not elsewhere classified, initial encounter: Secondary | ICD-10-CM | POA: Diagnosis not present

## 2013-05-11 DIAGNOSIS — M329 Systemic lupus erythematosus, unspecified: Secondary | ICD-10-CM | POA: Diagnosis not present

## 2013-05-12 ENCOUNTER — Encounter (HOSPITAL_COMMUNITY): Payer: Self-pay

## 2013-05-12 ENCOUNTER — Encounter (HOSPITAL_COMMUNITY): Admission: RE | Admit: 2013-05-12 | Payer: Medicare Other | Source: Ambulatory Visit

## 2013-05-12 NOTE — Patient Instructions (Signed)
Terisa Belardo  05/12/2013   Your procedure is scheduled on:  05/20/13  Report to [facility] at 07:30 AM.  Call this number if you have problems the morning of surgery: [phone]   Remember:   Do not eat food or drink liquids after midnight.   Take these medicines the morning of surgery with A SIP OF WATER: Lisinopril. You may take your Percoet and Zofran if needed.   Do not wear jewelry, make-up or nail polish.  Do not wear lotions, powders, or perfumes. You may wear deodorant.  Do not bring valuables to the hospital.  [facility] is not responsible for any belongings or valuables.               Contacts, dentures or bridgework may not be worn into surgery.  Leave suitcase in the car. After surgery it may be brought to your room.  For patients admitted to the hospital, discharge time is determined by your treatment team.               Patients discharged the day of surgery will not be allowed to drive home.    Special Instructions: N/A   Please read over the following fact sheets that you were given: Anesthesia Post-op Instructions    Esophagogastroduodenoscopy Esophagogastroduodenoscopy (EGD) is a procedure to examine the lining of the esophagus, stomach, and first part of the small intestine (duodenum). A long, flexible, lighted tube with a camera attached (endoscope) is inserted down the throat to view these organs. This procedure is done to detect problems or abnormalities, such as inflammation, bleeding, ulcers, or growths, in order to treat them. The procedure lasts about 5 20 minutes. It is usually an outpatient procedure, but it may need to be performed in emergency cases in the hospital. LET YOUR CAREGIVER KNOW ABOUT:   Allergies to food or medicine.  All medicines you are taking, including vitamins, herbs, eyedrops, and over-the-counter medicines and creams.  Use of steroids (by mouth or creams).  Previous problems you or members of your family have had with the use  of anesthetics.  Any blood disorders you have.  Previous surgeries you have had.  Other health problems you have.  Possibility of pregnancy, if this applies. RISKS AND COMPLICATIONS  Generally, EGD is a safe procedure. However, as with any procedure, complications can occur. Possible complications include:  Infection.  Bleeding.  Tearing (perforation) of the esophagus, stomach, or duodenum.  Difficulty breathing or not being able to breath.  Excessive sweating.  Spasms of the larynx.  Slowed heartbeat.  Low blood pressure. BEFORE THE PROCEDURE  Do not eat or drink anything for 6 8 hours before the procedure or as directed by your caregiver.  Ask your caregiver about changing or stopping your regular medicines.  If you wear dentures, be prepared to remove them before the procedure.  Arrange for someone to drive you home after the procedure. PROCEDURE   A vein will be accessed to give medicines and fluids. A medicine to relax you (sedative) and a pain reliever will be given through that access into the vein.  A numbing medicine (local anesthetic) may be sprayed on your throat for comfort and to stop you from gagging or coughing.  A mouth guard may be placed in your mouth to protect your teeth and to keep you from biting on the endoscope.  You will be asked to lie on your left side.  The endoscope is inserted down your throat and into the esophagus,  stomach, and duodenum.  Air is put through the endoscope to allow your caregiver to view the lining of your esophagus clearly.  The esophagus, stomach, and duodenum is then examined. During the exam, your caregiver may:  Remove tissue to be examined under a microscope (biopsy) for inflammation, infection, or other medical problems.  Remove growths.  Remove objects (foreign bodies) that are stuck.  Treat any bleeding with medicines or other devices that stop tissues from bleeding (hot cauters, clipping  devices).  Widen (dilate) or stretch narrowed areas of the esophagus and stomach.  The endoscope will then be withdrawn. AFTER THE PROCEDURE  You will be taken to a recovery area to be monitored. You will be able to go home once you are stable and alert.  Do not eat or drink anything until the local anesthetic and numbing medicines have worn off. You may choke.  It is normal to feel bloated, have pain with swallowing, or have a sore throat for a short time. This will wear off.  Your caregiver should be able to discuss his or her findings with you. It will take longer to discuss the test results if any biopsies were taken. Document Released: 07/05/2004 Document Revised: 02/19/2012 Document Reviewed: 02/05/2012 [facility] Patient Information 2014 [place], [place].    Colonoscopy A colonoscopy is an exam to look at the entire large intestine (colon). This exam can help find problems such as tumors, polyps, inflammation, and areas of bleeding. The exam takes about 1 hour.  LET [facility] CARE PROVIDER KNOW ABOUT:   Any allergies you have.  All medicines you are taking, including vitamins, herbs, eye drops, creams, and over-the-counter medicines.  Previous problems you or members of your family have had with the use of anesthetics.  Any blood disorders you have.  Previous surgeries you have had.  Medical conditions you have. RISKS AND COMPLICATIONS  Generally, this is a safe procedure. However, as with any procedure, complications can occur. Possible complications include:  Bleeding.  Tearing or rupture of the colon wall.  Reaction to medicines given during the exam.  Infection (rare). BEFORE THE PROCEDURE   Ask your health care provider about changing or stopping your regular medicines.  You may be prescribed an oral bowel prep. This involves drinking a large amount of medicated liquid, starting the day before your procedure. The liquid will cause you to have multiple loose  stools until your stool is almost clear or light green. This cleans out your colon in preparation for the procedure.  Do not eat or drink anything else once you have started the bowel prep, unless your health care provider tells you it is safe to do so.  Arrange for someone to drive you home after the procedure. PROCEDURE   You will be given medicine to help you relax (sedative).  You will lie on your side with your knees bent.  A long, flexible tube with a light and camera on the end (colonoscope) will be inserted through the rectum and into the colon. The camera sends video back to a computer screen as it moves through the colon. The colonoscope also releases carbon dioxide gas to inflate the colon. This helps your health care provider see the area better.  During the exam, your health care provider may take a small tissue sample (biopsy) to be examined under a microscope if any abnormalities are found.  The exam is finished when the entire colon has been viewed. AFTER THE PROCEDURE   Do not drive  for 24 hours after the exam.  You may have a small amount of blood in your stool.  You may pass moderate amounts of gas and have mild abdominal cramping or bloating. This is caused by the gas used to inflate your colon during the exam.  Ask when your test results will be ready and how you will get your results. Make sure you get your test results. Document Released: 03/01/2000 Document Revised: 12/23/2012 Document Reviewed: 11/09/2012 [facility] Patient Information 2014 [place].    PATIENT INSTRUCTIONS POST-ANESTHESIA  IMMEDIATELY FOLLOWING SURGERY:  Do not drive or operate machinery for the first twenty four hours after surgery.  Do not make any important decisions for twenty four hours after surgery or while taking narcotic pain medications or sedatives.  If you develop intractable nausea and vomiting or a severe headache please notify your doctor immediately.  FOLLOW-UP:   Please make an appointment with your surgeon as instructed. You do not need to follow up with anesthesia unless specifically instructed to do so.  WOUND CARE INSTRUCTIONS (if applicable):  Keep a dry clean dressing on the anesthesia/puncture wound site if there is drainage.  Once the wound has quit draining you may leave it open to air.  Generally you should leave the bandage intact for twenty four hours unless there is drainage.  If the epidural site drains for more than 36-48 hours please call the anesthesia department.  QUESTIONS?:  Please feel free to call your physician or the hospital operator if you have any questions, and they will be happy to assist you.

## 2013-05-12 NOTE — Progress Notes (Signed)
cc'd to pcp 

## 2013-05-13 ENCOUNTER — Encounter (HOSPITAL_COMMUNITY)
Admission: RE | Admit: 2013-05-13 | Discharge: 2013-05-13 | Disposition: A | Discharge status: Home / Self Care | Payer: Medicare Other | Source: Ambulatory Visit | Attending: Internal Medicine | Admitting: Internal Medicine

## 2013-05-14 ENCOUNTER — Emergency Department (HOSPITAL_COMMUNITY)
Admission: EM | Admit: 2013-05-14 | Discharge: 2013-05-15 | Disposition: A | Discharge status: Home / Self Care | Payer: Medicare Other | Attending: Emergency Medicine | Admitting: Emergency Medicine

## 2013-05-14 ENCOUNTER — Encounter (HOSPITAL_COMMUNITY): Payer: Self-pay | Admitting: Emergency Medicine

## 2013-05-14 DIAGNOSIS — Z90710 Acquired absence of both cervix and uterus: Secondary | ICD-10-CM | POA: Diagnosis not present

## 2013-05-14 DIAGNOSIS — R05 Cough: Secondary | ICD-10-CM | POA: Insufficient documentation

## 2013-05-14 DIAGNOSIS — R1110 Vomiting, unspecified: Secondary | ICD-10-CM | POA: Insufficient documentation

## 2013-05-14 DIAGNOSIS — Z792 Long term (current) use of antibiotics: Secondary | ICD-10-CM | POA: Insufficient documentation

## 2013-05-14 DIAGNOSIS — R059 Cough, unspecified: Secondary | ICD-10-CM | POA: Diagnosis not present

## 2013-05-14 DIAGNOSIS — R197 Diarrhea, unspecified: Secondary | ICD-10-CM | POA: Diagnosis not present

## 2013-05-14 DIAGNOSIS — Z9889 Other specified postprocedural states: Secondary | ICD-10-CM | POA: Diagnosis not present

## 2013-05-14 DIAGNOSIS — Z9089 Acquired absence of other organs: Secondary | ICD-10-CM | POA: Insufficient documentation

## 2013-05-14 DIAGNOSIS — Z8639 Personal history of other endocrine, nutritional and metabolic disease: Secondary | ICD-10-CM | POA: Diagnosis not present

## 2013-05-14 DIAGNOSIS — Z862 Personal history of diseases of the blood and blood-forming organs and certain disorders involving the immune mechanism: Secondary | ICD-10-CM | POA: Diagnosis not present

## 2013-05-14 DIAGNOSIS — I10 Essential (primary) hypertension: Secondary | ICD-10-CM | POA: Insufficient documentation

## 2013-05-14 DIAGNOSIS — Z79899 Other long term (current) drug therapy: Secondary | ICD-10-CM | POA: Diagnosis not present

## 2013-05-14 DIAGNOSIS — F17200 Nicotine dependence, unspecified, uncomplicated: Secondary | ICD-10-CM | POA: Diagnosis not present

## 2013-05-14 DIAGNOSIS — Z9884 Bariatric surgery status: Secondary | ICD-10-CM | POA: Diagnosis not present

## 2013-05-14 DIAGNOSIS — Z8739 Personal history of other diseases of the musculoskeletal system and connective tissue: Secondary | ICD-10-CM | POA: Insufficient documentation

## 2013-05-14 NOTE — ED Notes (Signed)
PT C/O N/V/D AND ABDOMINAL PAIN FOR SEVERAL MONTHS. SHE CAME IN TONIGHT BECAUSE THOSE SYMPTOMS GOT WORSE THIS MORNING.

## 2013-05-15 LAB — COMPREHENSIVE METABOLIC PANEL
ALBUMIN: 3.7 g/dL (ref 3.5–5.2)
ALT: 25 U/L (ref 0–35)
AST: 24 U/L (ref 0–37)
Alkaline Phosphatase: 86 U/L (ref 39–117)
BILIRUBIN TOTAL: 0.6 mg/dL (ref 0.3–1.2)
BUN: 10 mg/dL (ref 6–23)
CO2: 23 mEq/L (ref 19–32)
CREATININE: 0.66 mg/dL (ref 0.50–1.10)
Calcium: 9.4 mg/dL (ref 8.4–10.5)
Chloride: 96 mEq/L (ref 96–112)
GFR calc Af Amer: >90 mL/min (ref >90)
GFR calc non Af Amer: >90 mL/min (ref >90)
Glucose, Bld: 68 mg/dL — ABNORMAL LOW (ref 70–99)
Potassium: 4.3 mEq/L (ref 3.7–5.3)
Sodium: 137 mEq/L (ref 137–147)
Total Protein: 7.8 g/dL (ref 6.0–8.3)

## 2013-05-15 LAB — CBC WITH DIFFERENTIAL/PLATELET
BASOS ABS: 0 10*3/uL (ref 0.0–0.1)
Basophils Relative: 0 % (ref 0–1)
EOS ABS: 0 10*3/uL (ref 0.0–0.7)
Eosinophils Relative: 1 % (ref 0–5)
HCT: 40.2 % (ref 36.0–46.0)
Hemoglobin: 14.2 g/dL (ref 12.0–15.0)
Lymphocytes Relative: 44 % (ref 12–46)
Lymphs Abs: 2.7 10*3/uL (ref 0.7–4.0)
MCH: 30.1 pg (ref 26.0–34.0)
MCHC: 35.3 g/dL (ref 30.0–36.0)
MCV: 85.4 fL (ref 78.0–100.0)
Monocytes Absolute: 0.5 10*3/uL (ref 0.1–1.0)
Monocytes Relative: 8 % (ref 3–12)
Neutro Abs: 2.9 10*3/uL (ref 1.7–7.7)
Neutrophils Relative %: 47 % (ref 43–77)
PLATELETS: 426 10*3/uL — AB (ref 150–400)
RBC: 4.71 MIL/uL (ref 3.87–5.11)
RDW: 14.7 % (ref 11.5–15.5)
WBC: 6.2 10*3/uL (ref 4.0–10.5)

## 2013-05-15 LAB — LIPASE, BLOOD: LIPASE: 24 U/L (ref 11–59)

## 2013-05-15 MED ORDER — SODIUM CHLORIDE 0.9 % IV BOLUS (SEPSIS)
1000.0000 mL | Freq: Once | INTRAVENOUS | Status: AC
  Administered 2013-05-15: 1000 mL via INTRAVENOUS

## 2013-05-15 MED ORDER — ONDANSETRON HCL 4 MG/2ML IJ SOLN
4.0000 mg | Freq: Once | INTRAMUSCULAR | Status: AC
  Administered 2013-05-15: 4 mg via INTRAVENOUS
  Filled 2013-05-15: qty 2

## 2013-05-15 MED ORDER — HYDROMORPHONE HCL PF 1 MG/ML IJ SOLN
0.5000 mg | Freq: Once | INTRAMUSCULAR | Status: AC
  Administered 2013-05-15: 0.5 mg via INTRAVENOUS
  Filled 2013-05-15: qty 1

## 2013-05-15 NOTE — ED Notes (Signed)
Gave patient orange juice x 2 to drink as ordered by MD.

## 2013-05-15 NOTE — ED Provider Notes (Signed)
CSN: [id number]     Arrival date & time 05/14/13  2011 History   First MD Initiated Contact with Patient 05/15/13 0129     Chief Complaint  Patient presents with  . Abdominal Pain     Patient is a 48 y.o. female presenting with vomiting. The history is provided by the patient.  Emesis Severity:  Moderate Duration:  1 day Timing:  Intermittent Progression:  Worsening Chronicity:  Recurrent Relieved by:  Nothing Associated symptoms: abdominal pain, cough and diarrhea   Associated symptoms: no fever   PT presents for recurrence of vomiting/diarrhea (nonbloody) She reports she has had these episodes on/off for months This episode started in the past day She also reports diffuse abd pain associated with vomiting/diarrhea She also mentions mild cough  She reports h/o multiple abd surgeries, with h/o dehiscence of abdominal wound.  She is currently on oral bactrim for this to help prevent infection  Past Medical History  Diagnosis Date  . Lupus   . 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 Hypertension    Past Surgical History  Procedure Laterality Date  . Abdominal surgery    . Abdominal hysterectomy  2013    [place]  . Debridement of abdominal wall abscess N/A 02/08/2013    Procedure: DEBRIDEMENT OF ABDOMINAL WALL ABSCESS;  Surgeon: Jamesetta So, MD;  Location: [facility];  Service: General;  Laterality: N/A;  . Gastric bypass       [place], first 2000, 2005/2006.  . Colonoscopy      in [place]  . Cholecystectomy     Family History  Problem Relation Age of Onset  . Brain cancer Son   . Breast cancer Maternal Aunt   . Colon cancer Maternal Grandmother     late 65s, early 54s  . Lung cancer Father   . Liver disease Neg Hx    History  Substance Use Topics  . Smoking status: Current Every Day Smoker -- 1.00 packs/day for 35 years    Types: Cigarettes  . Smokeless tobacco: Never Used  . Alcohol Use: No     Comment: beer or liquor daily until "buzz" at least.  often in morning. states cut back lately.    OB History   Grav Para Term Preterm Abortions TAB SAB Ect Mult Living                 Review of Systems  Constitutional: Negative for fever.  Respiratory: Positive for cough.   Cardiovascular: Negative for chest pain.  Gastrointestinal: Positive for vomiting, abdominal pain and diarrhea. Negative for blood in stool.  All other systems reviewed and are negative.      Allergies  Codeine and Morphine and related  Home Medications   Current Outpatient Rx  Name  Route  Sig  Dispense  Refill  . diphenhydrAMINE (SOMINEX) 25 MG tablet   Oral   Take 25 mg by mouth as needed for sleep.         Marland Kitchen lisinopril (PRINIVIL) 10 MG tablet   Oral   Take 1 tablet (10 mg total) by mouth daily.   30 tablet   1   . ondansetron (ZOFRAN) 4 MG tablet   Oral   Take 1 tablet (4 mg total) by mouth every 6 (six) hours as needed for nausea or vomiting.   30 tablet   1   . oxyCODONE-acetaminophen (PERCOCET) 7.5-325 MG per tablet   Oral   Take 1 tablet by mouth every 6 (  six) hours as needed for pain.          . polyethylene glycol-electrolytes (TRILYTE) 420 G solution   Oral   Take 4,000 mLs by mouth as directed.   4000 mL   0   . sulfamethoxazole-trimethoprim (BACTRIM DS) 800-160 MG per tablet   Oral   Take 1 tablet by mouth 2 (two) times daily.          BP 103/66  Pulse 99  Temp(Src) 98.5 F (36.9 C) (Oral)  Resp 16  Ht 5\' 3"  (1.6 m)  Wt 141 lb (63.957 kg)  BMI 24.98 kg/m2  SpO2 100% Physical Exam CONSTITUTIONAL: Well developed/well nourished HEAD: Normocephalic/atraumatic EYES: EOMI/PERRL ENMT: Mucous membranes dry NECK: supple no meningeal signs SPINE:entire spine nontender CV: S1/S2 noted, no murmurs/rubs/gallops noted LUNGS: Lungs are clear to auscultation bilaterally, no apparent distress ABDOMEN: soft, mild diffuse tenderness, no rebound or guarding Scars noted to abdomen.  There is one small area of discharge noted  next to scar, but no active bleeding, and no induration.  No erythema/crepitus is noted.   GU:no cva tenderness NEURO: Pt is awake/alert, moves all extremitiesx4 EXTREMITIES: pulses normal, full ROM SKIN: warm, color normal PSYCH: no abnormalities of mood noted  ED Course  Procedures   Pt main issue today is vomiting/diarrhea This has improved, and her vitals improved BP 103/66  Pulse 99  Temp(Src) 98.5 F (36.9 C) (Oral)  Resp 16  Ht 5\' 3"  (1.6 m)  Wt 141 lb (63.957 kg)  BMI 24.98 kg/m2  SpO2 100% She is taking PO Her abd is soft without significant tenderness I don't feel acute imaging is needed at this time She has h/o surgical site infection, but this appears to be healing well without focal signs of acute abscess  Labs Review Labs Reviewed  COMPREHENSIVE METABOLIC PANEL - Abnormal; Notable for the following:    Glucose, Bld 68 (*)    All other components within normal limits  CBC WITH DIFFERENTIAL - Abnormal; Notable for the following:    Platelets 426 (*)    All other components within normal limits  LIPASE, BLOOD     MDM   Final diagnoses:  Vomiting and diarrhea    Nursing notes including past medical history and social history reviewed and considered in documentation Labs/vital reviewed and considered Previous records reviewed and considered     Sharyon Cable, MD 05/15/13 [phone]

## 2013-05-15 NOTE — Discharge Instructions (Signed)

## 2013-05-17 ENCOUNTER — Telehealth: Payer: Self-pay

## 2013-05-17 NOTE — Telephone Encounter (Signed)
Pt called and left VM that she is having some rectal bleeding. She can be reached at [phone]. This is Dr. Roseanne Kaufman pt and I am routing to Almyra Free to return her call.

## 2013-05-17 NOTE — Telephone Encounter (Signed)
Spoke with pt- she started having abd pain, N/V and diarrhea on Friday- she went to the ED and was given fluids and sent home. Pt started having rectal bleeding yesterday and continued N/V, she is unable to keep anything down. She admitted that she is drinking water, Gatorade and beer. Advised pt to try to stay away from the beer and try to get in at least a tablespoon of a clear liquid such as Gatorade, every 15 minutes, to try to keep from getting dehydrated. She said the blood was in the stool and when she wipes and found some in her underwear one time. No SOB, no dizziness, no fever. Pt c/o being tired. hgb at ED was 14.2 on 05/15/13. Pt is scheduled for tcs on 05/20/13 with RMR. Advised pt that LSL is out of the office at this time and that I would speak with her tomorrow but in the meantime, if she gets worse or develops any new symptoms such as SOB or dizziness, she should go to the ED. Pt verbalized understanding.

## 2013-05-18 DIAGNOSIS — L02219 Cutaneous abscess of trunk, unspecified: Secondary | ICD-10-CM | POA: Diagnosis not present

## 2013-05-18 DIAGNOSIS — L03319 Cellulitis of trunk, unspecified: Secondary | ICD-10-CM | POA: Diagnosis not present

## 2013-05-18 NOTE — Telephone Encounter (Signed)
I reviewed ER records. Patient has had intermittent N/V/D for several weeks. EGD/TCS planned for this week. Agree with need to avoid etoh. Stick to clear liquids until vomiting has stopped. If unable to keep anything down she can go back to ER. Otherwise plan for procedures as scheduled so we can figure out what is going on with her.   Take Zofran 4mg  every six hours as needed. Does she need new RX?

## 2013-05-19 NOTE — Telephone Encounter (Signed)
Pt aware. She has enough zofran.

## 2013-05-20 ENCOUNTER — Encounter (HOSPITAL_COMMUNITY)
Admission: RE | Disposition: A | Discharge status: Home / Self Care | Payer: Self-pay | Source: Ambulatory Visit | Attending: Internal Medicine

## 2013-05-20 ENCOUNTER — Other Ambulatory Visit: Payer: Self-pay | Admitting: Internal Medicine

## 2013-05-20 ENCOUNTER — Telehealth: Payer: Self-pay

## 2013-05-20 ENCOUNTER — Ambulatory Visit (HOSPITAL_COMMUNITY)
Admission: RE | Admit: 2013-05-20 | Discharge: 2013-05-20 | Disposition: A | Discharge status: Home / Self Care | Payer: Medicare Other | Source: Ambulatory Visit | Attending: Internal Medicine | Admitting: Internal Medicine

## 2013-05-20 ENCOUNTER — Ambulatory Visit (HOSPITAL_COMMUNITY): Payer: Medicare Other | Admitting: Anesthesiology

## 2013-05-20 ENCOUNTER — Encounter (HOSPITAL_COMMUNITY): Payer: Self-pay | Admitting: *Deleted

## 2013-05-20 ENCOUNTER — Encounter (HOSPITAL_COMMUNITY): Payer: Medicare Other | Admitting: Anesthesiology

## 2013-05-20 DIAGNOSIS — I10 Essential (primary) hypertension: Secondary | ICD-10-CM | POA: Insufficient documentation

## 2013-05-20 DIAGNOSIS — K838 Other specified diseases of biliary tract: Secondary | ICD-10-CM

## 2013-05-20 DIAGNOSIS — R197 Diarrhea, unspecified: Secondary | ICD-10-CM | POA: Diagnosis not present

## 2013-05-20 DIAGNOSIS — K0490 Unspecified diseases of pulp and periapical tissues: Secondary | ICD-10-CM

## 2013-05-20 DIAGNOSIS — K921 Melena: Secondary | ICD-10-CM | POA: Diagnosis not present

## 2013-05-20 DIAGNOSIS — D126 Benign neoplasm of colon, unspecified: Secondary | ICD-10-CM | POA: Diagnosis not present

## 2013-05-20 DIAGNOSIS — Z79899 Other long term (current) drug therapy: Secondary | ICD-10-CM | POA: Diagnosis not present

## 2013-05-20 DIAGNOSIS — K5730 Diverticulosis of large intestine without perforation or abscess without bleeding: Secondary | ICD-10-CM | POA: Diagnosis not present

## 2013-05-20 DIAGNOSIS — K625 Hemorrhage of anus and rectum: Secondary | ICD-10-CM

## 2013-05-20 DIAGNOSIS — R112 Nausea with vomiting, unspecified: Secondary | ICD-10-CM

## 2013-05-20 DIAGNOSIS — Q891 Congenital malformations of adrenal gland: Secondary | ICD-10-CM

## 2013-05-20 DIAGNOSIS — Z9889 Other specified postprocedural states: Secondary | ICD-10-CM | POA: Insufficient documentation

## 2013-05-20 HISTORY — PX: ESOPHAGOGASTRODUODENOSCOPY (EGD) WITH PROPOFOL: SHX5813

## 2013-05-20 HISTORY — PX: COLONOSCOPY WITH PROPOFOL: SHX5780

## 2013-05-20 HISTORY — PX: BIOPSY: SHX5522

## 2013-05-20 LAB — CLOSTRIDIUM DIFFICILE BY PCR: CDIFFPCR: NEGATIVE

## 2013-05-20 LAB — HEPATIC FUNCTION PANEL
ALK PHOS: 63 U/L (ref 39–117)
ALT: 18 U/L (ref 0–35)
AST: 27 U/L (ref 0–37)
Albumin: 3 g/dL — ABNORMAL LOW (ref 3.5–5.2)
Bilirubin, Direct: <0.2 mg/dL (ref 0.0–0.3)
Total Bilirubin: 0.4 mg/dL (ref 0.3–1.2)
Total Protein: 6.2 g/dL (ref 6.0–8.3)

## 2013-05-20 SURGERY — COLONOSCOPY WITH PROPOFOL
Anesthesia: Monitor Anesthesia Care

## 2013-05-20 MED ORDER — MIDAZOLAM HCL 2 MG/2ML IJ SOLN
INTRAMUSCULAR | Status: AC
  Filled 2013-05-20: qty 2

## 2013-05-20 MED ORDER — ONDANSETRON HCL 4 MG/2ML IJ SOLN
INTRAMUSCULAR | Status: AC
  Filled 2013-05-20: qty 2

## 2013-05-20 MED ORDER — LIDOCAINE VISCOUS 2 % MT SOLN
OROMUCOSAL | Status: DC | PRN
  Administered 2013-05-20: 3 mL via OROMUCOSAL

## 2013-05-20 MED ORDER — FENTANYL CITRATE 0.05 MG/ML IJ SOLN
25.0000 ug | INTRAMUSCULAR | Status: AC
  Administered 2013-05-20 (×2): 25 ug via INTRAVENOUS

## 2013-05-20 MED ORDER — GLYCOPYRROLATE 0.2 MG/ML IJ SOLN
INTRAMUSCULAR | Status: AC
  Filled 2013-05-20: qty 1

## 2013-05-20 MED ORDER — ONDANSETRON HCL 4 MG/2ML IJ SOLN: 4.0000 mg | Freq: Once | INTRAMUSCULAR | Status: AC | PRN

## 2013-05-20 MED ORDER — ONDANSETRON HCL 4 MG/2ML IJ SOLN
4.0000 mg | Freq: Once | INTRAMUSCULAR | Status: AC
  Administered 2013-05-20: 4 mg via INTRAVENOUS

## 2013-05-20 MED ORDER — PROPOFOL 10 MG/ML IV BOLUS
INTRAVENOUS | Status: AC
  Filled 2013-05-20: qty 20

## 2013-05-20 MED ORDER — MIDAZOLAM HCL 2 MG/2ML IJ SOLN
1.0000 mg | INTRAMUSCULAR | Status: DC | PRN
  Administered 2013-05-20: 2 mg via INTRAVENOUS

## 2013-05-20 MED ORDER — FENTANYL CITRATE 0.05 MG/ML IJ SOLN: 25.0000 ug | INTRAMUSCULAR | Status: DC | PRN

## 2013-05-20 MED ORDER — GLYCOPYRROLATE 0.2 MG/ML IJ SOLN
0.2000 mg | Freq: Once | INTRAMUSCULAR | Status: AC
  Administered 2013-05-20: 0.2 mg via INTRAVENOUS

## 2013-05-20 MED ORDER — STERILE WATER FOR IRRIGATION IR SOLN
Status: DC | PRN
  Administered 2013-05-20: 10:00:00

## 2013-05-20 MED ORDER — LIDOCAINE VISCOUS 2 % MT SOLN
OROMUCOSAL | Status: AC
Start: 2013-05-20 — End: 2013-05-20
  Filled 2013-05-20: qty 15

## 2013-05-20 MED ORDER — PROPOFOL INFUSION 10 MG/ML OPTIME
INTRAVENOUS | Status: DC | PRN
  Administered 2013-05-20: 50 ug/kg/min via INTRAVENOUS
  Administered 2013-05-20 (×2): via INTRAVENOUS

## 2013-05-20 MED ORDER — FENTANYL CITRATE 0.05 MG/ML IJ SOLN
INTRAMUSCULAR | Status: AC
  Filled 2013-05-20: qty 2

## 2013-05-20 MED ORDER — LACTATED RINGERS IV SOLN
INTRAVENOUS | Status: DC
  Administered 2013-05-20: 09:00:00 via INTRAVENOUS

## 2013-05-20 MED ORDER — WATER FOR IRRIGATION, STERILE IR SOLN
Status: DC | PRN
  Administered 2013-05-20: 1000 mL

## 2013-05-20 MED ORDER — MIDAZOLAM HCL 5 MG/5ML IJ SOLN
INTRAMUSCULAR | Status: DC | PRN
  Administered 2013-05-20 (×2): 2 mg via INTRAVENOUS

## 2013-05-20 MED ORDER — LORAZEPAM 2 MG PO TABS: 2.0000 mg | ORAL_TABLET | Freq: Once | ORAL | Status: DC

## 2013-05-20 SURGICAL SUPPLY — 13 items
BLOCK BITE 60FR ADLT L/F BLUE (MISCELLANEOUS) ×1 IMPLANT
FLOOR PAD 36X40 (MISCELLANEOUS) ×3
FORCEPS BIOP RAD 4 LRG CAP 4 (CUTTING FORCEPS) ×2 IMPLANT
FORMALIN 10 PREFIL 20ML (MISCELLANEOUS) ×2 IMPLANT
KIT CLEAN ENDO COMPLIANCE (KITS) ×3 IMPLANT
LUBRICANT JELLY 4.5OZ STERILE (MISCELLANEOUS) ×1 IMPLANT
MANIFOLD NEPTUNE II (INSTRUMENTS) ×1 IMPLANT
PAD FLOOR 36X40 (MISCELLANEOUS) IMPLANT
SYR INFLATION 60ML (SYRINGE) ×3 IMPLANT
TRAP SPECIMEN MUCOUS 40CC (MISCELLANEOUS) ×1 IMPLANT
TUBING ENDO SMARTCAP PENTAX (MISCELLANEOUS) ×1 IMPLANT
TUBING IRRIGATION ENDOGATOR (MISCELLANEOUS) ×1 IMPLANT
WATER STERILE IRR 1000ML POUR (IV SOLUTION) ×1 IMPLANT

## 2013-05-20 NOTE — Telephone Encounter (Signed)
Message copied by Claudina Lick on Thu May 20, 2013 12:29 PM ------      Message from: Daneil Dolin      Created: Thu May 20, 2013 12:02 PM       She can have Ativan 2 mg orally 30-45 minutes before the procedure. Swallow with a small amount of water      ----- Message -----         From: Rodney Langton         Sent: 05/20/2013  11:33 AM           To: Daneil Dolin, MD            Dr. Gala Romney, I am getting Angel [name] scheduled for her MRCP and the nurse from Endo stated that Mrs. Sweetland is needing something for sedation before MRI, do we do that?       ------

## 2013-05-20 NOTE — Op Note (Deleted)
NAME:  [name], Angel              ACCOUNT NO.:  [id number]  MEDICAL RECORD NO.:  [id number]  LOCATION:  [facility]                         FACILITY:  [facility]  PHYSICIAN:  R. Michael Henna Derderian, MD FACP FACGDATE OF BIRTH:  01/13/1966  DATE OF PROCEDURE: DATE OF DISCHARGE:  05/15/2013                              OPERATIVE REPORT   PROCEDURE:  EGD diagnostic followed by ileo-colonoscopy with segmental biopsy and stool sampling.  INDICATIONS FOR PROCEDURE:  A 47-year-old lady with intermittent nausea and vomiting, history of gastric surgery previously, also colitis on CT along with intermittent bloody diarrhea.  EGD and colonoscopy now being done to evaluate her symptoms.  Risks, benefits, limitations, alternatives, imponderables have been discussed with the patient at length, and with the family members at the bedside.  They all questions answered all parties agreeable.  PROCEDURE NOTE:  O2 saturation, blood pressure, pulse, respirations were monitored throughout the entire procedure. A deep sedation per Dr. Gonzales and associates.  INSTRUMENT:  Pentax video chip system.  FINDINGS:  EGD examination of tubular esophagus revealed a normal tubular esophagus, normal mucosa.  The stomach emptying surgically altered.  Residual gastric mucosa appeared normal.  I identified one patent efferent small bowel limb which appeared normal.  THERAPEUTIC/DIAGNOSTIC MANEUVERS PERFORMED:  None.  The patient tolerated this procedure well and was then prepared for colonoscopy.  Digital rectal exam revealed no abnormalities.  Endoscopic findings, prep was inadequate as far as polyp detection is concerned. Rectal vault was small.  Unable to retroflex.  Rectal mucosa seen well on face.  Rectal mucosa appeared normal.  The scope was easily advanced through the left, transverse and right colon to the appendiceal orifice, ileocecal valve/cecum.  These structures well seen photographed for the record.  The terminal  ileum was intubated 10 cm.  From this level, the scope was slowly and cautiously withdrawn.  All previously because surfaces were again seen.  The patient was noted to have scattered pancolonic diverticula.  The colonic mucosa otherwise appeared grossly normal.  Small lesion may have been obscured by the poor prep today.  The terminal ileal mucosa appeared normal.  Segmental biopsies of the ascending and sigmoid segments taken for histologic study.  Also a fresh stool specimen submitted for C. diff.  The patient tolerated this procedure well.  Cecal withdrawal time 9 minutes.  IMPRESSION: 1. EGD, status post prior gastric surgery with normal esophagus,     residual gastric mucosa and patent efferent limb. 2. Colonoscopy findings.  Inadequate prep at least for polyp     detection. 3. Normal-appearing rectum grossly normal colon aside from pancolonic     diverticula, status post segmental biopsy stool sampling. 4. Normal terminal ileum.  RECOMMENDATIONS: 1. Follow up on path and stool sample from today. 2. Proceed with an MRCP/MRI to further evaluate dilated bile duct, and     adrenal abnormality seen previously. 3. Further recommendations to follow. 4. Hepatic profile today.     R. Michael Chaselyn Nanney, MD FACP FACG     RMR/MEDQ  D:  05/20/2013  T:  05/20/2013  Job:  [id number]  cc:   [place] [id number] 

## 2013-05-20 NOTE — Interval H&P Note (Signed)
History and Physical Interval Note:  05/20/2013 8:59 AM  Angel [name]  has presented today for surgery, with the diagnosis of RECTAL BLEEDING, NAUSEA  AND VOMITING, ELEVATED LFT'S  The various methods of treatment have been discussed with the patient and family. After consideration of risks, benefits and other options for treatment, the patient has consented to  Procedure(s) with comments: COLONOSCOPY WITH PROPOFOL (N/A) - 9:00 ESOPHAGOGASTRODUODENOSCOPY (EGD) WITH PROPOFOL (N/A) as a surgical intervention .  The patient's history has been reviewed, patient examined, no change in status, stable for surgery.  I have reviewed the patient's chart and labs.  Questions were answered to the patient's satisfaction.      No change. EGD and colonoscopy per plan with deep sedation.  The risks, benefits, limitations, imponderables and alternatives regarding both EGD and colonoscopy have been reviewed with the patient. Questions have been answered. All parties agreeable.    Manus Rudd

## 2013-05-20 NOTE — Anesthesia Postprocedure Evaluation (Signed)
  Anesthesia Post-op Note  Patient: Angel [name]  Procedure(s) Performed: Procedure(s): COLONOSCOPY WITH PROPOFOL (Procedure #2) At cecum@0957  total withdrawal time= (N/A) ESOPHAGOGASTRODUODENOSCOPY (EGD) WITH PROPOFOL (Procedure #1) (N/A) BIOPSIES OF ASCENDING AND SIGMOID COLON  Patient Location: PACU  Anesthesia Type:MAC  Level of Consciousness: awake, alert , oriented and patient cooperative  Airway and Oxygen Therapy: Patient Spontanous Breathing  Post-op Pain: none  Post-op Assessment: Post-op Vital signs reviewed, Patient's Cardiovascular Status Stable, Respiratory Function Stable, Patent Airway, No signs of Nausea or vomiting and Pain level controlled  Post-op Vital Signs: Reviewed and stable  Complications: No apparent anesthesia complications

## 2013-05-20 NOTE — Op Note (Signed)
Angel [name], Angel [name]              ACCOUNT NO.:  [id number]  MEDICAL RECORD NO.:  [id number]  LOCATION:  [facility]                         FACILITY:  [facility]  PHYSICIAN:  R. Garfield Cornea, MD [name]:  18-Jul-1965  DATE OF PROCEDURE: DATE OF DISCHARGE:  05/15/2013                              OPERATIVE REPORT   PROCEDURE:  EGD diagnostic followed by ileo-colonoscopy with segmental biopsy and stool sampling.  INDICATIONS FOR PROCEDURE:  A 48 year old lady with intermittent nausea and vomiting, history of gastric surgery previously, also colitis on CT along with intermittent bloody diarrhea.  EGD and colonoscopy now being done to evaluate her symptoms.  Risks, benefits, limitations, alternatives, imponderables have been discussed with the patient at length, and with the family members at the bedside.  They all questions answered all parties agreeable.  PROCEDURE NOTE:  O2 saturation, blood pressure, pulse, respirations were monitored throughout the entire procedure. A deep sedation per Dr. Duwayne Heck and associates.  INSTRUMENT:  Pentax video chip system.  FINDINGS:  EGD examination of tubular esophagus revealed a normal tubular esophagus, normal mucosa.  The stomach emptying surgically altered.  Residual gastric mucosa appeared normal.  I identified one patent efferent small bowel limb which appeared normal.  THERAPEUTIC/DIAGNOSTIC MANEUVERS PERFORMED:  None.  The patient tolerated this procedure well and was then prepared for colonoscopy.  Digital rectal exam revealed no abnormalities.  Endoscopic findings, prep was inadequate as far as polyp detection is concerned. Rectal vault was small.  Unable to retroflex.  Rectal mucosa seen well on face.  Rectal mucosa appeared normal.  The scope was easily advanced through the left, transverse and right colon to the appendiceal orifice, ileocecal valve/cecum.  These structures well seen photographed for the record.  The terminal  ileum was intubated 10 cm.  From this level, the scope was slowly and cautiously withdrawn.  All previously because surfaces were again seen.  The patient was noted to have scattered pancolonic diverticula.  The colonic mucosa otherwise appeared grossly normal.  Small lesion may have been obscured by the poor prep today.  The terminal ileal mucosa appeared normal.  Segmental biopsies of the ascending and sigmoid segments taken for histologic study.  Also a fresh stool specimen submitted for C. diff.  The patient tolerated this procedure well.  Cecal withdrawal time 9 minutes.  IMPRESSION: 1. EGD, status post prior gastric surgery with normal esophagus,     residual gastric mucosa and patent efferent limb. 2. Colonoscopy findings.  Inadequate prep at least for polyp     detection. 3. Normal-appearing rectum grossly normal colon aside from pancolonic     diverticula, status post segmental biopsy stool sampling. 4. Normal terminal ileum.  RECOMMENDATIONS: 1. Follow up on path and stool sample from today. 2. Proceed with an MRCP/MRI to further evaluate dilated bile duct, and     adrenal abnormality seen previously. 3. Further recommendations to follow. 4. Hepatic profile today.     Bridgette Habermann, MD FACP [facility]     RMR/MEDQ  D:  05/20/2013  T:  05/20/2013  Job:  [id number]  cc:   Dr. Ebony Hail L. [employer] [place] [id number]

## 2013-05-20 NOTE — Telephone Encounter (Signed)
rx sent to the pharmacy. 

## 2013-05-20 NOTE — Discharge Instructions (Addendum)
EGD Discharge instructions Please read the instructions outlined below and refer to this sheet in the next few weeks. These discharge instructions provide you with general information on caring for yourself after you leave the hospital. Your doctor may also give you specific instructions. While your treatment has been planned according to the most current medical practices available, unavoidable complications occasionally occur. If you have any problems or questions after discharge, please call your doctor. ACTIVITY  You may resume your regular activity but move at a slower pace for the next 24 hours.   Take frequent rest periods for the next 24 hours.   Walking will help expel (get rid of) the air and reduce the bloated feeling in your abdomen.   No driving for 24 hours (because of the anesthesia (medicine) used during the test).   You may shower.   Do not sign any important legal documents or operate any machinery for 24 hours (because of the anesthesia used during the test).  NUTRITION  Drink plenty of fluids.   You may resume your normal diet.   Begin with a light meal and progress to your normal diet.   Avoid alcoholic beverages for 24 hours or as instructed by your caregiver.  MEDICATIONS  You may resume your normal medications unless your caregiver tells you otherwise.  WHAT YOU CAN EXPECT TODAY  You may experience abdominal discomfort such as a feeling of fullness or gas pains.  FOLLOW-UP  Your doctor will discuss the results of your test with you.  SEEK IMMEDIATE MEDICAL ATTENTION IF ANY OF THE FOLLOWING OCCUR:  Excessive nausea (feeling sick to your stomach) and/or vomiting.   Severe abdominal pain and distention (swelling).   Trouble swallowing.   Temperature over 101 F (37.8 C).   Rectal bleeding or vomiting of blood.   Colonoscopy Discharge Instructions  Read the instructions outlined below and refer to this sheet in the next few weeks. These  discharge instructions provide you with general information on caring for yourself after you leave the hospital. Your doctor may also give you specific instructions. While your treatment has been planned according to the most current medical practices available, unavoidable complications occasionally occur. If you have any problems or questions after discharge, call Dr. Gala Romney at [phone]. ACTIVITY  You may resume your regular activity, but move at a slower pace for the next 24 hours.   Take frequent rest periods for the next 24 hours.   Walking will help get rid of the air and reduce the bloated feeling in your belly (abdomen).   No driving for 24 hours (because of the medicine (anesthesia) used during the test).    Do not sign any important legal documents or operate any machinery for 24 hours (because of the anesthesia used during the test).  NUTRITION  Drink plenty of fluids.   You may resume your normal diet as instructed by your doctor.   Begin with a light meal and progress to your normal diet. Heavy or fried foods are harder to digest and may make you feel sick to your stomach (nauseated).   Avoid alcoholic beverages for 24 hours or as instructed.  MEDICATIONS  You may resume your normal medications unless your doctor tells you otherwise.  WHAT YOU CAN EXPECT TODAY  Some feelings of bloating in the abdomen.   Passage of more gas than usual.   Spotting of blood in your stool or on the toilet paper.  IF YOU HAD POLYPS REMOVED DURING THE COLONOSCOPY:  No aspirin products for 7 days or as instructed.   No alcohol for 7 days or as instructed.   Eat a soft diet for the next 24 hours.  FINDING OUT THE RESULTS OF YOUR TEST Not all test results are available during your visit. If your test results are not back during the visit, make an appointment with your caregiver to find out the results. Do not assume everything is normal if you have not heard from your caregiver or the  medical facility. It is important for you to follow up on all of your test results.  SEEK IMMEDIATE MEDICAL ATTENTION IF:  You have more than a spotting of blood in your stool.   Your belly is swollen (abdominal distention).   You are nauseated or vomiting.   You have a temperature over 101.   You have abdominal pain or discomfort that is severe or gets worse throughout the day.    Further recommendations to follow pending review of pathology report and stool studies  Will schedule MRCP to evaluate dilated bile duct and MRI of the abnormal adrenal gland at the same time in the near future  Hepatic profile today

## 2013-05-20 NOTE — H&P (View-Only) (Signed)
Primary Care Physician:  Doran Heater, MD  Primary Gastroenterologist:  Garfield Cornea, MD  Chief Complaint  Patient presents with  . Rectal Bleeding    HPI:  Angel [name] is a 48 y.o. female here for further evaluation of rectal bleeding and several week history of diarrhea. She was evaluated in the ER twice back in 03/2013. Admitted 04/18/13 with abdominal wall abscess. Two CTs as outlined below. On first CT she had extensive colitis noted.  No stool studies done to date.  She has been on antibiotics for abdominal wall abscesses. Currently on Bactrim. She continues to have discomfort related to this. Diarrhea for weeks. BM 1-6 per day. Some nocturnal diarrhea. Last rectal bleeding about two weeks ago. Moderate brbpr. No blood clots. No melena. No heartburn. Some vomiting almost daily. Some small volume/streaks of hematemesis. No significant weight loss.   She has mild bump in AST/ALT. She was noted to have biliary dilation on CT. She has had numerous surgeries with "gastric bypass" for PUD in 2000. Reports having numerous surgeries related to this since that time. H/O tatoos, blood transfusion in 2005. H/O intranasal cocaine. None in six months. Ongoing etoh abuse. Drinks daily, to get a buzz.  Weighed more than 300 pounds before her gastric surgery, did not have done for weight loss purposes however.    Current Outpatient Prescriptions  Medication Sig Dispense Refill  . doxycycline (VIBRA-TABS) 100 MG tablet       . lisinopril (PRINIVIL) 10 MG tablet Take 1 tablet (10 mg total) by mouth daily.  30 tablet  1  . oxyCODONE-acetaminophen (PERCOCET) 7.5-325 MG per tablet Take 1 tablet by mouth every 6 (six) hours as needed.       . sulfamethoxazole-trimethoprim (BACTRIM DS) 800-160 MG per tablet Take 1 tablet by mouth 2 (two) times daily.       No current facility-administered medications for this visit.    Allergies as of 05/07/2013 - Review Complete 05/07/2013  Allergen Reaction Noted   . Codeine  02/06/2013  . Morphine and related  02/06/2013    Past Medical History  Diagnosis Date  . Lupus   . Thyroid disease   . Hypertension     Past Surgical History  Procedure Laterality Date  . Abdominal surgery    . Abdominal hysterectomy    . Debridement of abdominal wall abscess N/A 02/08/2013    Procedure: DEBRIDEMENT OF ABDOMINAL WALL ABSCESS;  Surgeon: Jamesetta So, MD;  Location: [facility];  Service: General;  Laterality: N/A;  . Gastric bypass  2000 and 2005  . Colonoscopy      in [place]    Family History  Problem Relation Age of Onset  . Cancer Son   . Cancer Maternal Aunt   . Cancer Maternal Grandmother   . Colon cancer Maternal Grandmother     History   Social History  . Marital Status: Married    Spouse Name: N/A    Number of Children: N/A  . Years of Education: N/A   Occupational History  . Not on file.   Social History Main Topics  . Smoking status: Current Every Day Smoker -- 1.00 packs/day    Types: Cigarettes  . Smokeless tobacco: Never Used  . Alcohol Use: Yes     Comment: beer or liquor daily until "buzz" at least. often in morning.   . Drug Use: Yes    Special: Cocaine     Comment: quit 09/2012  . Sexual Activity: Yes  Birth Control/ Protection: Surgical   Other Topics Concern  . Not on file   Social History Narrative  . No narrative on file      ROS:  General: Negative for anorexia, weight loss, fever, chills, fatigue, weakness. Eyes: Negative for vision changes.  ENT: Negative for hoarseness, difficulty swallowing , nasal congestion. CV: Negative for chest pain, angina, palpitations, dyspnea on exertion, peripheral edema.  Respiratory: Negative for dyspnea at rest, dyspnea on exertion, cough, sputum, wheezing.  GI: See history of present illness. GU:  Negative for dysuria, hematuria, urinary incontinence, urinary frequency, nocturnal urination.  MS: Negative for joint pain, low back pain.  Derm: Negative for rash  or itching.  Neuro: Negative for weakness, abnormal sensation, seizure, frequent headaches, memory loss, confusion.  Psych: Negative for anxiety, suicidal ideation, hallucinations. Situational depression. Endo: Negative for unusual weight change.  Heme: Negative for bruising or bleeding. Allergy: Negative for rash or hives.    Physical Examination:  BP 119/86  Pulse 101  Temp(Src) 98.3 F (36.8 C) (Oral)  Ht 5' (1.524 m)  Wt 141 lb 12.8 oz (64.32 kg)  BMI 27.69 kg/m2   General: Well-nourished, well-developed in no acute distress.  Head: Normocephalic, atraumatic.   Eyes: Conjunctiva pink, no icterus. Mouth: Oropharyngeal mucosa moist and pink , no lesions erythema or exudate. Neck: Supple without thyromegaly, masses, or lymphadenopathy.  Lungs: Clear to auscultation bilaterally.  Heart: Regular rate and rhythm, no murmurs rubs or gallops.  Abdomen: Bowel sounds are normal, mild abdominal wall tenderness at site of wound, nondistended, no hepatosplenomegaly or masses, no abdominal bruits or    hernia , no rebound or guarding.   Rectal: not performed Extremities: No lower extremity edema. No clubbing or deformities.  Neuro: Alert and oriented x 4 , grossly normal neurologically.  Skin: Warm and dry, no rash or jaundice.   Psych: Alert and cooperative, normal mood and affect.  Labs: Lab Results  Component Value Date   WBC 6.7 05/04/2013   HGB 12.2 05/04/2013   HCT 34.4* 05/04/2013   MCV 86.2 05/04/2013   PLT 306 05/04/2013   Lab Results  Component Value Date   CREATININE 0.55 05/04/2013   BUN 5* 05/04/2013   NA 136* 05/04/2013   K 3.5* 05/04/2013   CL 98 05/04/2013   CO2 24 05/04/2013   Lab Results  Component Value Date   ALT 49* 05/04/2013   AST 51* 05/04/2013   ALKPHOS 92 05/04/2013   BILITOT 0.2* 05/04/2013     Imaging Studies: Ct Abdomen Pelvis W Contrast  05/04/2013   CLINICAL DATA:  Vomiting. History of gastric bypass. Open wound to abdomen from prior abdominal wall  debridement.  EXAM: CT ABDOMEN AND PELVIS WITH CONTRAST  TECHNIQUE: Multidetector CT imaging of the abdomen and pelvis was performed using the standard protocol following bolus administration of intravenous contrast.  CONTRAST:  139mL OMNIPAQUE IOHEXOL 300 MG/ML  SOLN  COMPARISON:  04/29/2013  FINDINGS: Insert basis  Prior cholecystectomy. Prior gastric bypass. Extensive surgical clips around the right kidney, possibly from prior adrenalectomy. Mild biliary ductal dilatation, stable since prior study. Common bile duct measures up to 17 mm. No focal hepatic lesion. Mild intrahepatic biliary ductal dilatation. Spleen, pancreas, kidneys are unremarkable. Left adrenal nodule measures 2.0 cm and is stable. There is a dilated segment of small bowel in the left abdomen at the suture line, likely related to postsurgical denervation changes. No evidence of bowel obstruction. No free air. Large bowel grossly unremarkable.  Prior hysterectomy.  No adnexal masses. Urinary bladder is unremarkable.  Aorta and iliac vessels are non aneurysmal. Surgical changes and stranding noted in the anterior abdominal wall. The previously seen fluid collections have decreased, but the at overall area of abnormal soft tissue is similar to prior study, measuring at 8.4 x 1.6 cm compared with 8.4 x 2.8 cm previously. However, internally within the soft tissue, there is decreased fluid.  No acute bony abnormality.  IMPRESSION: Extensive surgical changes in the stomach including cholecystectomy, gastric bypass, right adrenalectomy, and hysterectomy.  Continued abnormal soft tissue in the anterior abdominal wall in the area of debridement and prior fluid collection. Internal fluid density has decreased since prior study.  Mild intrahepatic and extrahepatic biliary duct dilatation, stable.   Electronically Signed   By: Rolm Baptise M.D.   On: 05/04/2013 21:30   Ct Abdomen Pelvis W Contrast  04/18/2013   CLINICAL DATA:  History of lupus and thyroid  disease with abdominal pain and swelling and redness inferior and lateral to the umbilicus  EXAM: CT ABDOMEN AND PELVIS WITH CONTRAST  TECHNIQUE: Multidetector CT imaging of the abdomen and pelvis was performed using the standard protocol following bolus administration of intravenous contrast.  CONTRAST:  39mL OMNIPAQUE IOHEXOL 300 MG/ML SOLN, 140mL OMNIPAQUE IOHEXOL 300 MG/ML SOLN  COMPARISON:  None.  FINDINGS: Visualized lung bases clear. Mild atelectatic change the left lung base.  Significant diffuse fatty infiltration of the liver. No focal hepatic abnormalities. Gallbladder is surgically absent. Spleen is normal. Pancreas is normal. Mild dilatation of the common bile duct is likely related to prior cholecystectomy. There is a left adrenal mass. It measures 24 x 21 mm. It demonstrates an average attenuation value of 56. Right adrenal gland appears to be surgically absent. Right kidney is normal. Left kidney is normal. There is mild dilatation of left ureter as compared to the right but there is no stone or perinephric inflammatory change.  Bladder is normal. There is wall thickening involving the rectum, sigmoid colon, and descending colon. The transverse colon and ascending colon as well as the cecum shows similar up mild to moderate diffuse wall thickening.  Inferior to the umbilicus, there is a rim enhancing fluid collection in the left subcutaneous soft tissues, measuring 28 x 25 mm. This associated with diffuse infraumbilical midline wall thickening and a band of inflammatory change extending to the anterior abdominal wall, measuring about 3.8 cm. The entire presumably inflammatory process measures 84 x 28 mm. There may be a tiny focus of gas chest inside the anterior abdominal wall to the left of midline at this level seen on image number 53. Across the abdominal wall, inside the peritoneal cavity adjacent to this inflammatory process, there are several loops of otherwise normal appearing small bowel  located anteriorly against the anterior abdominal wall.  IMPRESSION: 1. Moderately severe diffuse colon wall thickening consistent with diffuse colitis 2. Inflammatory change in the infraumbilical abdominal wall in the midline and to the left of midline. There appears to be at least 1 abscess cavity towards the left aspect of this process, measuring 2.8 x 2.5 cm. Etiology is uncertain. This process extends down to the deep fascia of the abdominal wall. Across the abdominal wall within the adjacent peritoneum, there are numerous loops of otherwise normal appearing small bowel. There is an equivocal tiny focus of gas anteriorly within the peritoneum at this level. Bowel perforation with spread of infection to the anterior abdominal wall is not excluded. It is also possible this represents  a superficial infection that spreads down to the deep subcutaneous soft tissues but does not involve bowel. Note that the inflamed: Described above does not pass under knee the subcutaneous inflammation and it appears to be a separate process. 3. Indeterminate left adrenal mass. Recommend nonemergent further characterization with adrenal protocol MRI.   Electronically Signed   By: Skipper Cliche M.D.   On: 04/18/2013 22:08

## 2013-05-20 NOTE — Anesthesia Preprocedure Evaluation (Signed)
Anesthesia Evaluation  Patient identified by MRN, date of birth, ID band Patient awake    Reviewed: Allergy & Precautions, H&P , NPO status , Patient's Chart, lab work & pertinent test results  History of Anesthesia Complications Negative for: history of anesthetic complications  Airway Mallampati: I TM Distance: >3 FB     Dental  (+) Teeth Intact   Pulmonary Current Smoker,  breath sounds clear to auscultation        Cardiovascular hypertension, Pt. on medications Rhythm:Regular Rate:Normal     Neuro/Psych  Headaches, PSYCHIATRIC DISORDERS Bipolar Disorder    GI/Hepatic   Endo/Other  Diabetes: s/p  I131 Rx.Hypothyroidism   Renal/GU      Musculoskeletal   Abdominal   Peds  Hematology   Anesthesia Other Findings   Reproductive/Obstetrics                           Anesthesia Physical Anesthesia Plan  ASA: II  Anesthesia Plan: MAC   Post-op Pain Management:    Induction: Intravenous  Airway Management Planned: Simple Face Mask  Additional Equipment:   Intra-op Plan:   Post-operative Plan:   Informed Consent: I have reviewed the patients History and Physical, chart, labs and discussed the procedure including the risks, benefits and alternatives for the proposed anesthesia with the patient or authorized representative who has indicated his/her understanding and acceptance.     Plan Discussed with:   Anesthesia Plan Comments:         Anesthesia Quick Evaluation

## 2013-05-20 NOTE — Transfer of Care (Signed)
Immediate Anesthesia Transfer of Care Note  Patient: Angel [name]  Procedure(s) Performed: Procedure(s): COLONOSCOPY WITH PROPOFOL (Procedure #2) At cecum@0957  total withdrawal time= (N/A) ESOPHAGOGASTRODUODENOSCOPY (EGD) WITH PROPOFOL (Procedure #1) (N/A) BIOPSIES OF ASCENDING AND SIGMOID COLON  Patient Location: PACU  Anesthesia Type:MAC  Level of Consciousness: awake and patient cooperative  Airway & Oxygen Therapy: Patient Spontanous Breathing and Patient connected to face mask oxygen  Post-op Assessment: Report given to PACU RN, Post -op Vital signs reviewed and stable and Patient moving all extremities  Post vital signs: Reviewed and stable  Complications: No apparent anesthesia complications

## 2013-05-21 DIAGNOSIS — T8189XA Other complications of procedures, not elsewhere classified, initial encounter: Secondary | ICD-10-CM | POA: Diagnosis not present

## 2013-05-21 DIAGNOSIS — L02219 Cutaneous abscess of trunk, unspecified: Secondary | ICD-10-CM | POA: Diagnosis not present

## 2013-05-21 DIAGNOSIS — T8131XA Disruption of external operation (surgical) wound, not elsewhere classified, initial encounter: Secondary | ICD-10-CM | POA: Diagnosis not present

## 2013-05-21 DIAGNOSIS — M329 Systemic lupus erythematosus, unspecified: Secondary | ICD-10-CM | POA: Diagnosis not present

## 2013-05-24 ENCOUNTER — Encounter (HOSPITAL_COMMUNITY): Payer: Self-pay | Admitting: Internal Medicine

## 2013-05-24 ENCOUNTER — Encounter: Payer: Self-pay | Admitting: Internal Medicine

## 2013-05-24 NOTE — Interval H&P Note (Signed)
History and Physical Interval Note:  05/24/2013 12:49 PM  Angel Price  has presented today for surgery, with the diagnosis of RECTAL BLEEDING, NAUSEA  AND VOMITING, ELEVATED LIVER FUNCTION TESTS  The various methods of treatment have been discussed with the patient and family. After consideration of risks, benefits and other options for treatment, the patient has consented to  Procedure(s): COLONOSCOPY WITH PROPOFOL (Procedure #2) At cecum@0957  total withdrawal time= (N/A) ESOPHAGOGASTRODUODENOSCOPY (EGD) WITH PROPOFOL (Procedure #1) (N/A) BIOPSIES OF ASCENDING AND SIGMOID COLON as a surgical intervention .  The patient's history has been reviewed, patient examined, no change in status, stable for surgery.  I have reviewed the patient's chart and labs.  Questions were answered to the patient's satisfaction.     Manus Rudd

## 2013-05-25 ENCOUNTER — Telehealth: Payer: Self-pay

## 2013-05-25 LAB — I-STAT CG4 LACTIC ACID, ED: Lactic Acid, Venous: 0.88 mmol/L (ref 0.5–2.2)

## 2013-05-25 NOTE — Telephone Encounter (Signed)
Results Cc to PCP  

## 2013-05-25 NOTE — Telephone Encounter (Signed)
Letter from: Daneil Dolin    Reason for Letter: Results Review    Send letter to patient.  Send copy of letter with path to referring provider and PCP.   Needs repeat colonoscopy in one year. Needs a followup appointment with extender in the office to deal with other loose ends within the next 3 months if not already scheduled

## 2013-05-25 NOTE — Telephone Encounter (Signed)
Letter mailed to pt.  

## 2013-05-27 ENCOUNTER — Ambulatory Visit (HOSPITAL_COMMUNITY)
Admit: 2013-05-27 | Discharge: 2013-05-27 | Disposition: A | Discharge status: Home / Self Care | Payer: Medicare Other | Attending: Internal Medicine | Admitting: Internal Medicine

## 2013-05-27 DIAGNOSIS — Q891 Congenital malformations of adrenal gland: Secondary | ICD-10-CM

## 2013-05-27 DIAGNOSIS — K838 Other specified diseases of biliary tract: Secondary | ICD-10-CM

## 2013-05-31 NOTE — Telephone Encounter (Signed)
Reminder in epic °

## 2013-06-03 ENCOUNTER — Other Ambulatory Visit: Payer: Self-pay | Admitting: Internal Medicine

## 2013-06-03 ENCOUNTER — Ambulatory Visit (HOSPITAL_COMMUNITY)
Admission: RE | Admit: 2013-06-03 | Discharge: 2013-06-03 | Disposition: A | Discharge status: Home / Self Care | Payer: Medicare Other | Source: Ambulatory Visit | Attending: Internal Medicine | Admitting: Internal Medicine

## 2013-06-03 DIAGNOSIS — K838 Other specified diseases of biliary tract: Secondary | ICD-10-CM

## 2013-06-03 DIAGNOSIS — L02219 Cutaneous abscess of trunk, unspecified: Secondary | ICD-10-CM | POA: Diagnosis not present

## 2013-06-03 DIAGNOSIS — Q891 Congenital malformations of adrenal gland: Secondary | ICD-10-CM

## 2013-06-03 DIAGNOSIS — D3500 Benign neoplasm of unspecified adrenal gland: Secondary | ICD-10-CM | POA: Diagnosis not present

## 2013-06-03 DIAGNOSIS — M329 Systemic lupus erythematosus, unspecified: Secondary | ICD-10-CM | POA: Diagnosis not present

## 2013-06-03 DIAGNOSIS — K7689 Other specified diseases of liver: Secondary | ICD-10-CM | POA: Insufficient documentation

## 2013-06-03 DIAGNOSIS — T8189XA Other complications of procedures, not elsewhere classified, initial encounter: Secondary | ICD-10-CM | POA: Diagnosis not present

## 2013-06-03 DIAGNOSIS — T8131XA Disruption of external operation (surgical) wound, not elsewhere classified, initial encounter: Secondary | ICD-10-CM | POA: Diagnosis not present

## 2013-06-03 MED ORDER — SODIUM CHLORIDE 0.9 % IV SOLN
INTRAVENOUS | Status: AC
  Filled 2013-06-03: qty 200

## 2013-06-03 MED ORDER — GADOBENATE DIMEGLUMINE 529 MG/ML IV SOLN
13.0000 mL | Freq: Once | INTRAVENOUS | Status: AC | PRN
  Administered 2013-06-03: 13 mL via INTRAVENOUS

## 2013-06-06 ENCOUNTER — Emergency Department (HOSPITAL_COMMUNITY)
Admission: EM | Admit: 2013-06-06 | Discharge: 2013-06-06 | Disposition: A | Discharge status: Home / Self Care | Payer: Medicare Other | Attending: Emergency Medicine | Admitting: Emergency Medicine

## 2013-06-06 ENCOUNTER — Encounter (HOSPITAL_COMMUNITY): Payer: Self-pay | Admitting: Emergency Medicine

## 2013-06-06 ENCOUNTER — Emergency Department (HOSPITAL_COMMUNITY): Payer: Medicare Other

## 2013-06-06 DIAGNOSIS — R112 Nausea with vomiting, unspecified: Secondary | ICD-10-CM | POA: Insufficient documentation

## 2013-06-06 DIAGNOSIS — R109 Unspecified abdominal pain: Secondary | ICD-10-CM | POA: Diagnosis not present

## 2013-06-06 DIAGNOSIS — R1033 Periumbilical pain: Secondary | ICD-10-CM | POA: Diagnosis not present

## 2013-06-06 DIAGNOSIS — Z862 Personal history of diseases of the blood and blood-forming organs and certain disorders involving the immune mechanism: Secondary | ICD-10-CM | POA: Diagnosis not present

## 2013-06-06 DIAGNOSIS — Z8639 Personal history of other endocrine, nutritional and metabolic disease: Secondary | ICD-10-CM | POA: Insufficient documentation

## 2013-06-06 DIAGNOSIS — Z90710 Acquired absence of both cervix and uterus: Secondary | ICD-10-CM | POA: Insufficient documentation

## 2013-06-06 DIAGNOSIS — I10 Essential (primary) hypertension: Secondary | ICD-10-CM | POA: Insufficient documentation

## 2013-06-06 DIAGNOSIS — F17200 Nicotine dependence, unspecified, uncomplicated: Secondary | ICD-10-CM | POA: Insufficient documentation

## 2013-06-06 DIAGNOSIS — Z79899 Other long term (current) drug therapy: Secondary | ICD-10-CM | POA: Diagnosis not present

## 2013-06-06 DIAGNOSIS — R197 Diarrhea, unspecified: Secondary | ICD-10-CM | POA: Insufficient documentation

## 2013-06-06 DIAGNOSIS — Z9089 Acquired absence of other organs: Secondary | ICD-10-CM | POA: Diagnosis not present

## 2013-06-06 DIAGNOSIS — Z8739 Personal history of other diseases of the musculoskeletal system and connective tissue: Secondary | ICD-10-CM | POA: Diagnosis not present

## 2013-06-06 DIAGNOSIS — Z9889 Other specified postprocedural states: Secondary | ICD-10-CM | POA: Insufficient documentation

## 2013-06-06 DIAGNOSIS — Z792 Long term (current) use of antibiotics: Secondary | ICD-10-CM | POA: Diagnosis not present

## 2013-06-06 DIAGNOSIS — L089 Local infection of the skin and subcutaneous tissue, unspecified: Secondary | ICD-10-CM | POA: Diagnosis not present

## 2013-06-06 DIAGNOSIS — D3500 Benign neoplasm of unspecified adrenal gland: Secondary | ICD-10-CM | POA: Diagnosis not present

## 2013-06-06 LAB — CBC WITH DIFFERENTIAL/PLATELET
BASOS ABS: 0 10*3/uL (ref 0.0–0.1)
Basophils Relative: 0 % (ref 0–1)
Eosinophils Absolute: 0 10*3/uL (ref 0.0–0.7)
Eosinophils Relative: 0 % (ref 0–5)
HCT: 41.9 % (ref 36.0–46.0)
Hemoglobin: 15.1 g/dL — ABNORMAL HIGH (ref 12.0–15.0)
Lymphocytes Relative: 34 % (ref 12–46)
Lymphs Abs: 1.9 10*3/uL (ref 0.7–4.0)
MCH: 30.9 pg (ref 26.0–34.0)
MCHC: 36 g/dL (ref 30.0–36.0)
MCV: 85.7 fL (ref 78.0–100.0)
MONO ABS: 0.4 10*3/uL (ref 0.1–1.0)
Monocytes Relative: 8 % (ref 3–12)
NEUTROS ABS: 3.1 10*3/uL (ref 1.7–7.7)
NEUTROS PCT: 58 % (ref 43–77)
Platelets: 397 10*3/uL (ref 150–400)
RBC: 4.89 MIL/uL (ref 3.87–5.11)
RDW: 15.1 % (ref 11.5–15.5)
WBC: 5.4 10*3/uL (ref 4.0–10.5)

## 2013-06-06 LAB — HEPATIC FUNCTION PANEL
ALBUMIN: 3.8 g/dL (ref 3.5–5.2)
ALT: 38 U/L — ABNORMAL HIGH (ref 0–35)
AST: 76 U/L — AB (ref 0–37)
Alkaline Phosphatase: 87 U/L (ref 39–117)
Bilirubin, Direct: <0.2 mg/dL (ref 0.0–0.3)
TOTAL PROTEIN: 8.2 g/dL (ref 6.0–8.3)
Total Bilirubin: 0.3 mg/dL (ref 0.3–1.2)

## 2013-06-06 LAB — URINALYSIS, ROUTINE W REFLEX MICROSCOPIC
Bilirubin Urine: NEGATIVE
Glucose, UA: NEGATIVE mg/dL
Hgb urine dipstick: NEGATIVE
Ketones, ur: NEGATIVE mg/dL
LEUKOCYTES UA: NEGATIVE
NITRITE: NEGATIVE
PH: 5.5 (ref 5.0–8.0)
Protein, ur: NEGATIVE mg/dL
UROBILINOGEN UA: 0.2 mg/dL (ref 0.0–1.0)

## 2013-06-06 LAB — BASIC METABOLIC PANEL
BUN: 8 mg/dL (ref 6–23)
CHLORIDE: 94 meq/L — AB (ref 96–112)
CO2: 16 mEq/L — ABNORMAL LOW (ref 19–32)
Calcium: 9.6 mg/dL (ref 8.4–10.5)
Creatinine, Ser: 0.49 mg/dL — ABNORMAL LOW (ref 0.50–1.10)
GFR calc Af Amer: >90 mL/min (ref >90)
GFR calc non Af Amer: >90 mL/min (ref >90)
Glucose, Bld: 75 mg/dL (ref 70–99)
POTASSIUM: 3.9 meq/L (ref 3.7–5.3)
Sodium: 136 mEq/L — ABNORMAL LOW (ref 137–147)

## 2013-06-06 LAB — TROPONIN I: Troponin I: <0.3 ng/mL (ref <0.30)

## 2013-06-06 MED ORDER — ONDANSETRON 4 MG PO TBDP: ORAL_TABLET | ORAL | Status: DC

## 2013-06-06 MED ORDER — IOHEXOL 300 MG/ML  SOLN
100.0000 mL | Freq: Once | INTRAMUSCULAR | Status: AC | PRN
  Administered 2013-06-06: 100 mL via INTRAVENOUS

## 2013-06-06 MED ORDER — PROMETHAZINE HCL 25 MG/ML IJ SOLN
12.5000 mg | Freq: Once | INTRAMUSCULAR | Status: AC
Start: 2013-06-06 — End: 2013-06-06
  Administered 2013-06-06: 12.5 mg via INTRAVENOUS

## 2013-06-06 MED ORDER — ONDANSETRON HCL 4 MG/2ML IJ SOLN: 4.0000 mg | Freq: Once | INTRAMUSCULAR | Status: DC

## 2013-06-06 MED ORDER — SODIUM CHLORIDE 0.9 % IV BOLUS (SEPSIS)
1000.0000 mL | Freq: Once | INTRAVENOUS | Status: AC
Start: 2013-06-06 — End: 2013-06-06
  Administered 2013-06-06: 1000 mL via INTRAVENOUS

## 2013-06-06 MED ORDER — HYDROMORPHONE HCL PF 1 MG/ML IJ SOLN
1.0000 mg | Freq: Once | INTRAMUSCULAR | Status: AC
  Administered 2013-06-06: 1 mg via INTRAVENOUS
  Filled 2013-06-06: qty 1

## 2013-06-06 MED ORDER — HYDROCODONE-ACETAMINOPHEN 5-325 MG PO TABS: 1.0000 | ORAL_TABLET | Freq: Four times a day (QID) | ORAL | Status: DC | PRN

## 2013-06-06 MED ORDER — HYDROMORPHONE HCL PF 1 MG/ML IJ SOLN
1.0000 mg | Freq: Once | INTRAMUSCULAR | Status: AC
  Administered 2013-06-06: 1 mg via INTRAVENOUS

## 2013-06-06 MED ORDER — VANCOMYCIN HCL IN DEXTROSE 1-5 GM/200ML-% IV SOLN
1000.0000 mg | Freq: Once | INTRAVENOUS | Status: AC
  Administered 2013-06-06: 1000 mg via INTRAVENOUS
  Filled 2013-06-06: qty 200

## 2013-06-06 MED ORDER — PROMETHAZINE HCL 25 MG/ML IJ SOLN
INTRAMUSCULAR | Status: AC
  Administered 2013-06-06: 12.5 mg via INTRAVENOUS
  Filled 2013-06-06: qty 1

## 2013-06-06 MED ORDER — ONDANSETRON HCL 4 MG/2ML IJ SOLN
4.0000 mg | Freq: Once | INTRAMUSCULAR | Status: AC
  Administered 2013-06-06: 4 mg via INTRAVENOUS
  Filled 2013-06-06: qty 2

## 2013-06-06 MED ORDER — OXYCODONE-ACETAMINOPHEN 5-325 MG PO TABS
1.0000 | ORAL_TABLET | Freq: Once | ORAL | Status: AC
  Administered 2013-06-06: 1 via ORAL
  Filled 2013-06-06: qty 1

## 2013-06-06 MED ORDER — HYDROMORPHONE HCL PF 1 MG/ML IJ SOLN
INTRAMUSCULAR | Status: AC
  Filled 2013-06-06: qty 1

## 2013-06-06 MED ORDER — SULFAMETHOXAZOLE-TRIMETHOPRIM 800-160 MG PO TABS: 1.0000 | ORAL_TABLET | Freq: Two times a day (BID) | ORAL | Status: DC

## 2013-06-06 NOTE — ED Notes (Signed)
Patient complaining of abdominal pain with nausea, vomiting, and diarrhea starting last night.

## 2013-06-06 NOTE — Discharge Instructions (Signed)
Drink plenty of fluids.  Follow up with dr Arnoldo Morale in 2-3 days

## 2013-06-06 NOTE — ED Provider Notes (Signed)
CSN: [id number]     Arrival date & time 06/06/13  1805 History   First MD Initiated Contact with Patient 06/06/13 1937     Chief Complaint  Patient presents with  . Emesis  . Abdominal Pain  . Diarrhea     (Consider location/radiation/quality/duration/timing/severity/associated sxs/prior Treatment) Patient is a 48 y.o. female presenting with vomiting, abdominal pain, and diarrhea. The history is provided by the patient (the pt complains of vomiting and abd pain).  Emesis Severity:  Moderate Timing:  Constant Quality:  Undigested food Able to tolerate:  Liquids Onset of vomiting after eating: unknown. Progression:  Unchanged Associated symptoms: abdominal pain and diarrhea   Associated symptoms: no headaches   Abdominal Pain Associated symptoms: diarrhea, nausea and vomiting   Associated symptoms: no chest pain, no cough, no fatigue and no hematuria   Diarrhea Associated symptoms: abdominal pain and vomiting   Associated symptoms: no headaches     Past Medical History  Diagnosis Date  . Lupus   . 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 Hypertension    Past Surgical History  Procedure Laterality Date  . Abdominal surgery    . Abdominal hysterectomy  2013    [place]  . Debridement of abdominal wall abscess N/A 02/08/2013    Procedure: DEBRIDEMENT OF ABDOMINAL WALL ABSCESS;  Surgeon: Jamesetta So, MD;  Location: [facility];  Service: General;  Laterality: N/A;  . Gastric bypass       [place], first 2000, 2005/2006.  . Colonoscopy      in [place]  . Cholecystectomy    . Colonoscopy with propofol N/A 05/20/2013    Procedure: COLONOSCOPY WITH PROPOFOL (Procedure #2) At cecum@0957  total withdrawal time=;  Surgeon: Daneil Dolin, MD;  Location: [facility];  Service: Endoscopy;  Laterality: N/A;  . Esophagogastroduodenoscopy (egd) with propofol N/A 05/20/2013    Procedure: ESOPHAGOGASTRODUODENOSCOPY (EGD) WITH PROPOFOL (Procedure #1);  Surgeon: Daneil Dolin, MD;   Location: [facility];  Service: Endoscopy;  Laterality: N/A;  . Esophageal biopsy  05/20/2013    Procedure: BIOPSIES OF ASCENDING AND SIGMOID COLON;  Surgeon: Daneil Dolin, MD;  Location: [facility];  Service: Endoscopy;;   Family History  Problem Relation Age of Onset  . Brain cancer Son   . Breast cancer Maternal Aunt   . Colon cancer Maternal Grandmother     late 64s, early 72s  . Lung cancer Father   . Liver disease Neg Hx    History  Substance Use Topics  . Smoking status: Current Every Day Smoker -- 1.00 packs/day for 35 years    Types: Cigarettes  . Smokeless tobacco: Never Used  . Alcohol Use: Yes     Comment: beer or liquor daily until "buzz" at least. often in morning. states cut back lately.    OB History   Grav Para Term Preterm Abortions TAB SAB Ect Mult Living                 Review of Systems  Constitutional: Negative for appetite change and fatigue.  HENT: Negative for congestion, ear discharge and sinus pressure.   Eyes: Negative for discharge.  Respiratory: Negative for cough.   Cardiovascular: Negative for chest pain.  Gastrointestinal: Positive for nausea, vomiting, abdominal pain and diarrhea.  Genitourinary: Negative for frequency and hematuria.  Musculoskeletal: Negative for back pain.  Skin: Negative for rash.  Neurological: Negative for seizures and headaches.  Psychiatric/Behavioral: Negative for hallucinations.      Allergies  Codeine and Morphine and related  Home Medications   Current Outpatient Rx  Name  Route  Sig  Dispense  Refill  . lisinopril (PRINIVIL) 10 MG tablet   Oral   Take 1 tablet (10 mg total) by mouth daily.   30 tablet   1   . ondansetron (ZOFRAN) 4 MG tablet   Oral   Take 1 tablet (4 mg total) by mouth every 6 (six) hours as needed for nausea or vomiting.   30 tablet   1   . HYDROcodone-acetaminophen (NORCO/VICODIN) 5-325 MG per tablet   Oral   Take 1 tablet by mouth every 6 (six) hours as needed for moderate pain.    20 tablet   0   . ondansetron (ZOFRAN ODT) 4 MG disintegrating tablet      4mg  ODT q4 hours prn nausea/vomit   12 tablet   0   . sulfamethoxazole-trimethoprim (SEPTRA DS) 800-160 MG per tablet   Oral   Take 1 tablet by mouth 2 (two) times daily.   20 tablet   0    BP 141/102  Pulse 106  Temp(Src) 98 F (36.7 C) (Oral)  Resp 20  Ht 5\' 3"  (1.6 m)  Wt 131 lb (59.421 kg)  BMI 23.21 kg/m2  SpO2 98% Physical Exam  Constitutional: She is oriented to person, place, and time. She appears well-developed.  HENT:  Head: Normocephalic.  Eyes: Conjunctivae and EOM are normal. No scleral icterus.  Neck: Neck supple. No thyromegaly present.  Cardiovascular: Normal rate and regular rhythm.  Exam reveals no gallop and no friction rub.   No murmur heard. Pulmonary/Chest: No stridor. She has no wheezes. She has no rales. She exhibits no tenderness.  Abdominal: She exhibits no distension. There is tenderness. There is no rebound.  Pt has many scars on her abd.  Mild swelling left side abd near umbilicus.  Mild tenderness.  Possible skin infection  Musculoskeletal: Normal range of motion. She exhibits no edema.  Lymphadenopathy:    She has no cervical adenopathy.  Neurological: She is oriented to person, place, and time. She exhibits normal muscle tone. Coordination normal.  Skin: No rash noted. No erythema.  Psychiatric: She has a normal mood and affect. Her behavior is normal.    ED Course  Procedures (including critical care time) Labs Review Labs Reviewed  CBC WITH DIFFERENTIAL - Abnormal; Notable for the following:    Hemoglobin 15.1 (*)    All other components within normal limits  BASIC METABOLIC PANEL - Abnormal; Notable for the following:    Sodium 136 (*)    Chloride 94 (*)    CO2 16 (*)    Creatinine, Ser 0.49 (*)    All other components within normal limits  URINALYSIS, ROUTINE W REFLEX MICROSCOPIC - Abnormal; Notable for the following:    Specific Gravity, Urine  >1.030 (*)    All other components within normal limits  HEPATIC FUNCTION PANEL - Abnormal; Notable for the following:    AST 76 (*)    ALT 38 (*)    All other components within normal limits  TROPONIN I   Imaging Review Ct Abdomen Pelvis W Contrast  06/06/2013   CLINICAL DATA:  Abdominal pain.  EXAM: CT ABDOMEN AND PELVIS WITH CONTRAST  TECHNIQUE: Multidetector CT imaging of the abdomen and pelvis was performed using the standard protocol following bolus administration of intravenous contrast.  CONTRAST:  162mL OMNIPAQUE IOHEXOL 300 MG/ML  SOLN  COMPARISON:  CT of the abdomen  and pelvis 05/04/2013.  FINDINGS: Lung Bases: Unremarkable.  Abdomen/Pelvis: Status post cholecystectomy, gastric bypass procedure and hysterectomy. Numerous surgical clips are noted superior and medial to the right kidney, potentially from prior right adrenalectomy. 2 cm left adrenal nodule is incompletely characterized, but unchanged compared to prior examination dating back to 04/18/2013 (and recently characterized as an adenoma on MRI 06/03/2013). Common bile duct appears slightly dilated measuring 9 mm in the porta hepatis, however, this is stable compared to prior studies. No significant intrahepatic biliary ductal dilatation. The appearance of the liver, pancreas, spleen and bilateral kidneys is unremarkable.  No significant volume of ascites. No pneumoperitoneum. No pathologic distention of small bowel. No definite lymphadenopathy identified within the abdomen or pelvis. Atherosclerosis throughout the abdominal and pelvic vasculature, without evidence of aneurysm. Ovaries are not confidently identified may be surgically absent or atrophic. Urinary bladder is unremarkable in appearance.  As with prior examinations, there is some amorphous soft tissue density in the anterior abdominal wall around the umbilicus, similar to prior study from 05/04/2013 which is favored to represent sequela of prior wound infection with anterior  abdominal wall abscesses or fistulous tracts. Given the stability in size, persistent intra abdominal wall abscesses are unlikely, and this is favored to represent chronic postoperative scar tissue at this time.  Musculoskeletal: There are no aggressive appearing lytic or blastic lesions noted in the visualized portions of the skeleton.  IMPRESSION: 1. No acute findings in the abdomen or pelvis to account for the patient's symptoms. 2. Persistent prominence of abnormal soft tissue in the anterior abdominal wall around the umbilicus, likely to represent resolving postoperative scar tissue. No definite abdominal wall abscess is confidently identified at this time, although the presence of persistent infected tissue with draining sinus tracts is not excluded, and clinical correlation with physical exam is recommended. 3. 2 cm left adrenal adenoma is unchanged. 4. Postoperative changes redemonstrated, as above. 5. Persistent prominence of the extrahepatic bile ducts, similar to prior studies. This is favored to be functional postcholecystectomy bile duct dilatation rather than evidence of obstruction, particularly in correlation with prior MRI 06/03/2013 (see that report for further details).   Electronically Signed   By: Vinnie Langton M.D.   On: 06/06/2013 21:38     EKG Interpretation None      MDM   Final diagnoses:  Abdominal pain  Skin infection    tx with bactrim and follow up with her surgeon,  Who takes care of her abd scar pain    Maudry Diego, MD 06/06/13 2157

## 2013-06-06 NOTE — ED Notes (Signed)
Patient with continued pain, but stable. Respirations even and unlabored. Skin warm/dry. Discharge instructions reviewed with patient at this time. Patient given opportunity to voice concerns/ask questions. IV removed per policy and band-aid applied to site. Patient discharged at this time and left Emergency Department with steady gait.

## 2013-06-06 NOTE — ED Notes (Addendum)
Patient in waiting room started complaining of severe chest pain. EKG performed in triage area and given to MD. Protocol orders placed for lab work including troponin. Patient returned to waiting room and advised to inform us of any new or worsening changes.

## 2013-06-15 DIAGNOSIS — L02219 Cutaneous abscess of trunk, unspecified: Secondary | ICD-10-CM | POA: Diagnosis not present

## 2013-06-15 DIAGNOSIS — M329 Systemic lupus erythematosus, unspecified: Secondary | ICD-10-CM | POA: Diagnosis not present

## 2013-06-15 DIAGNOSIS — T8131XA Disruption of external operation (surgical) wound, not elsewhere classified, initial encounter: Secondary | ICD-10-CM | POA: Diagnosis not present

## 2013-06-15 DIAGNOSIS — T8189XA Other complications of procedures, not elsewhere classified, initial encounter: Secondary | ICD-10-CM | POA: Diagnosis not present

## 2013-06-24 ENCOUNTER — Encounter (HOSPITAL_COMMUNITY)
Admission: EM | Disposition: A | Discharge status: Home / Self Care | Payer: Self-pay | Source: Home / Self Care | Attending: Internal Medicine

## 2013-06-24 ENCOUNTER — Inpatient Hospital Stay (HOSPITAL_COMMUNITY)
Admission: EM | Admit: 2013-06-24 | Discharge: 2013-07-03 | DRG: 605 | Disposition: A | Discharge status: Home / Self Care | Payer: Medicare Other | Attending: Internal Medicine | Admitting: Internal Medicine

## 2013-06-24 ENCOUNTER — Encounter (HOSPITAL_COMMUNITY): Payer: Self-pay | Admitting: Emergency Medicine

## 2013-06-24 ENCOUNTER — Encounter (HOSPITAL_COMMUNITY): Payer: Medicare Other | Admitting: Anesthesiology

## 2013-06-24 ENCOUNTER — Inpatient Hospital Stay (HOSPITAL_COMMUNITY): Payer: Medicare Other | Admitting: Anesthesiology

## 2013-06-24 DIAGNOSIS — I959 Hypotension, unspecified: Secondary | ICD-10-CM | POA: Diagnosis not present

## 2013-06-24 DIAGNOSIS — Z720 Tobacco use: Secondary | ICD-10-CM

## 2013-06-24 DIAGNOSIS — S31109A Unspecified open wound of abdominal wall, unspecified quadrant without penetration into peritoneal cavity, initial encounter: Secondary | ICD-10-CM | POA: Diagnosis present

## 2013-06-24 DIAGNOSIS — R51 Headache: Secondary | ICD-10-CM

## 2013-06-24 DIAGNOSIS — X789XXA Intentional self-harm by unspecified sharp object, initial encounter: Secondary | ICD-10-CM | POA: Diagnosis present

## 2013-06-24 DIAGNOSIS — Z23 Encounter for immunization: Secondary | ICD-10-CM | POA: Diagnosis not present

## 2013-06-24 DIAGNOSIS — R7401 Elevation of levels of liver transaminase levels: Secondary | ICD-10-CM | POA: Diagnosis present

## 2013-06-24 DIAGNOSIS — F332 Major depressive disorder, recurrent severe without psychotic features: Secondary | ICD-10-CM | POA: Diagnosis not present

## 2013-06-24 DIAGNOSIS — Z9884 Bariatric surgery status: Secondary | ICD-10-CM | POA: Diagnosis not present

## 2013-06-24 DIAGNOSIS — Z90710 Acquired absence of both cervix and uterus: Secondary | ICD-10-CM

## 2013-06-24 DIAGNOSIS — R000 Tachycardia, unspecified: Secondary | ICD-10-CM | POA: Diagnosis present

## 2013-06-24 DIAGNOSIS — S61509A Unspecified open wound of unspecified wrist, initial encounter: Principal | ICD-10-CM | POA: Diagnosis present

## 2013-06-24 DIAGNOSIS — E278 Other specified disorders of adrenal gland: Secondary | ICD-10-CM

## 2013-06-24 DIAGNOSIS — I10 Essential (primary) hypertension: Secondary | ICD-10-CM

## 2013-06-24 DIAGNOSIS — T46905A Adverse effect of unspecified agents primarily affecting the cardiovascular system, initial encounter: Secondary | ICD-10-CM | POA: Diagnosis not present

## 2013-06-24 DIAGNOSIS — A4902 Methicillin resistant Staphylococcus aureus infection, unspecified site: Secondary | ICD-10-CM | POA: Diagnosis present

## 2013-06-24 DIAGNOSIS — K5900 Constipation, unspecified: Secondary | ICD-10-CM | POA: Diagnosis not present

## 2013-06-24 DIAGNOSIS — Z808 Family history of malignant neoplasm of other organs or systems: Secondary | ICD-10-CM | POA: Diagnosis not present

## 2013-06-24 DIAGNOSIS — E876 Hypokalemia: Secondary | ICD-10-CM | POA: Diagnosis present

## 2013-06-24 DIAGNOSIS — S66921A Laceration of unspecified muscle, fascia and tendon at wrist and hand level, right hand, initial encounter: Secondary | ICD-10-CM

## 2013-06-24 DIAGNOSIS — R519 Headache, unspecified: Secondary | ICD-10-CM

## 2013-06-24 DIAGNOSIS — R197 Diarrhea, unspecified: Secondary | ICD-10-CM

## 2013-06-24 DIAGNOSIS — F319 Bipolar disorder, unspecified: Secondary | ICD-10-CM

## 2013-06-24 DIAGNOSIS — S59909A Unspecified injury of unspecified elbow, initial encounter: Secondary | ICD-10-CM | POA: Diagnosis not present

## 2013-06-24 DIAGNOSIS — G479 Sleep disorder, unspecified: Secondary | ICD-10-CM

## 2013-06-24 DIAGNOSIS — D62 Acute posthemorrhagic anemia: Secondary | ICD-10-CM | POA: Diagnosis present

## 2013-06-24 DIAGNOSIS — R4589 Other symptoms and signs involving emotional state: Secondary | ICD-10-CM | POA: Diagnosis not present

## 2013-06-24 DIAGNOSIS — M25539 Pain in unspecified wrist: Secondary | ICD-10-CM | POA: Diagnosis present

## 2013-06-24 DIAGNOSIS — E0590 Thyrotoxicosis, unspecified without thyrotoxic crisis or storm: Secondary | ICD-10-CM | POA: Diagnosis present

## 2013-06-24 DIAGNOSIS — T1491XA Suicide attempt, initial encounter: Secondary | ICD-10-CM

## 2013-06-24 DIAGNOSIS — Z800 Family history of malignant neoplasm of digestive organs: Secondary | ICD-10-CM

## 2013-06-24 DIAGNOSIS — H547 Unspecified visual loss: Secondary | ICD-10-CM

## 2013-06-24 DIAGNOSIS — S66909A Unspecified injury of unspecified muscle, fascia and tendon at wrist and hand level, unspecified hand, initial encounter: Secondary | ICD-10-CM | POA: Diagnosis not present

## 2013-06-24 DIAGNOSIS — S61511A Laceration without foreign body of right wrist, initial encounter: Secondary | ICD-10-CM

## 2013-06-24 DIAGNOSIS — R945 Abnormal results of liver function studies: Secondary | ICD-10-CM

## 2013-06-24 DIAGNOSIS — M329 Systemic lupus erythematosus, unspecified: Secondary | ICD-10-CM | POA: Diagnosis present

## 2013-06-24 DIAGNOSIS — R7989 Other specified abnormal findings of blood chemistry: Secondary | ICD-10-CM

## 2013-06-24 DIAGNOSIS — R002 Palpitations: Secondary | ICD-10-CM

## 2013-06-24 DIAGNOSIS — L02219 Cutaneous abscess of trunk, unspecified: Secondary | ICD-10-CM

## 2013-06-24 DIAGNOSIS — F1010 Alcohol abuse, uncomplicated: Secondary | ICD-10-CM | POA: Diagnosis present

## 2013-06-24 DIAGNOSIS — F17200 Nicotine dependence, unspecified, uncomplicated: Secondary | ICD-10-CM | POA: Diagnosis present

## 2013-06-24 DIAGNOSIS — Z885 Allergy status to narcotic agent status: Secondary | ICD-10-CM | POA: Diagnosis not present

## 2013-06-24 DIAGNOSIS — R7402 Elevation of levels of lactic acid dehydrogenase (LDH): Secondary | ICD-10-CM | POA: Diagnosis present

## 2013-06-24 DIAGNOSIS — R740 Nonspecific elevation of levels of transaminase and lactic acid dehydrogenase [LDH]: Secondary | ICD-10-CM

## 2013-06-24 DIAGNOSIS — D72819 Decreased white blood cell count, unspecified: Secondary | ICD-10-CM | POA: Diagnosis present

## 2013-06-24 DIAGNOSIS — Z8659 Personal history of other mental and behavioral disorders: Secondary | ICD-10-CM

## 2013-06-24 DIAGNOSIS — Y832 Surgical operation with anastomosis, bypass or graft as the cause of abnormal reaction of the patient, or of later complication, without mention of misadventure at the time of the procedure: Secondary | ICD-10-CM | POA: Diagnosis present

## 2013-06-24 DIAGNOSIS — Z803 Family history of malignant neoplasm of breast: Secondary | ICD-10-CM | POA: Diagnosis not present

## 2013-06-24 DIAGNOSIS — F1411 Cocaine abuse, in remission: Secondary | ICD-10-CM

## 2013-06-24 DIAGNOSIS — R112 Nausea with vomiting, unspecified: Secondary | ICD-10-CM

## 2013-06-24 DIAGNOSIS — F609 Personality disorder, unspecified: Secondary | ICD-10-CM

## 2013-06-24 DIAGNOSIS — Z90721 Acquired absence of ovaries, unilateral: Secondary | ICD-10-CM

## 2013-06-24 DIAGNOSIS — Z801 Family history of malignant neoplasm of trachea, bronchus and lung: Secondary | ICD-10-CM

## 2013-06-24 DIAGNOSIS — S61519A Laceration without foreign body of unspecified wrist, initial encounter: Secondary | ICD-10-CM | POA: Diagnosis present

## 2013-06-24 DIAGNOSIS — L03319 Cellulitis of trunk, unspecified: Secondary | ICD-10-CM

## 2013-06-24 DIAGNOSIS — E039 Hypothyroidism, unspecified: Secondary | ICD-10-CM

## 2013-06-24 DIAGNOSIS — K625 Hemorrhage of anus and rectum: Secondary | ICD-10-CM

## 2013-06-24 DIAGNOSIS — L02211 Cutaneous abscess of abdominal wall: Secondary | ICD-10-CM

## 2013-06-24 DIAGNOSIS — Z113 Encounter for screening for infections with a predominantly sexual mode of transmission: Secondary | ICD-10-CM

## 2013-06-24 HISTORY — PX: WOUND EXPLORATION: SHX6188

## 2013-06-24 LAB — RAPID URINE DRUG SCREEN, HOSP PERFORMED
AMPHETAMINES: NOT DETECTED
BENZODIAZEPINES: NOT DETECTED
Barbiturates: NOT DETECTED
Cocaine: NOT DETECTED
OPIATES: NOT DETECTED
Tetrahydrocannabinol: NOT DETECTED

## 2013-06-24 LAB — CBC WITH DIFFERENTIAL/PLATELET
BASOS ABS: 0 10*3/uL (ref 0.0–0.1)
BASOS PCT: 0 % (ref 0–1)
EOS PCT: 1 % (ref 0–5)
Eosinophils Absolute: 0 10*3/uL (ref 0.0–0.7)
HCT: 25.1 % — ABNORMAL LOW (ref 36.0–46.0)
Hemoglobin: 9 g/dL — ABNORMAL LOW (ref 12.0–15.0)
Lymphocytes Relative: 47 % — ABNORMAL HIGH (ref 12–46)
Lymphs Abs: 1.5 10*3/uL (ref 0.7–4.0)
MCH: 30.9 pg (ref 26.0–34.0)
MCHC: 35.9 g/dL (ref 30.0–36.0)
MCV: 86.3 fL (ref 78.0–100.0)
Monocytes Absolute: 0.4 10*3/uL (ref 0.1–1.0)
Monocytes Relative: 13 % — ABNORMAL HIGH (ref 3–12)
Neutro Abs: 1.2 10*3/uL — ABNORMAL LOW (ref 1.7–7.7)
Neutrophils Relative %: 39 % — ABNORMAL LOW (ref 43–77)
PLATELETS: 214 10*3/uL (ref 150–400)
RBC: 2.91 MIL/uL — ABNORMAL LOW (ref 3.87–5.11)
RDW: 14.9 % (ref 11.5–15.5)
WBC: 3.2 10*3/uL — ABNORMAL LOW (ref 4.0–10.5)

## 2013-06-24 LAB — COMPREHENSIVE METABOLIC PANEL
ALT: 40 U/L — ABNORMAL HIGH (ref 0–35)
AST: 112 U/L — AB (ref 0–37)
Albumin: 2.8 g/dL — ABNORMAL LOW (ref 3.5–5.2)
Alkaline Phosphatase: 70 U/L (ref 39–117)
BUN: 4 mg/dL — ABNORMAL LOW (ref 6–23)
CO2: 17 mEq/L — ABNORMAL LOW (ref 19–32)
Calcium: 7.8 mg/dL — ABNORMAL LOW (ref 8.4–10.5)
Chloride: 104 mEq/L (ref 96–112)
Creatinine, Ser: 0.55 mg/dL (ref 0.50–1.10)
GFR calc non Af Amer: >90 mL/min (ref >90)
GLUCOSE: 104 mg/dL — AB (ref 70–99)
Potassium: 3.9 mEq/L (ref 3.7–5.3)
Sodium: 141 mEq/L (ref 137–147)
TOTAL PROTEIN: 5.9 g/dL — AB (ref 6.0–8.3)
Total Bilirubin: 0.4 mg/dL (ref 0.3–1.2)

## 2013-06-24 LAB — TYPE AND SCREEN
ABO/RH(D): O POS
Antibody Screen: NEGATIVE

## 2013-06-24 LAB — CBC
HEMATOCRIT: 25 % — AB (ref 36.0–46.0)
HEMOGLOBIN: 8.9 g/dL — AB (ref 12.0–15.0)
MCH: 30.3 pg (ref 26.0–34.0)
MCHC: 35.6 g/dL (ref 30.0–36.0)
MCV: 85 fL (ref 78.0–100.0)
PLATELETS: 200 10*3/uL (ref 150–400)
RBC: 2.94 MIL/uL — AB (ref 3.87–5.11)
RDW: 14.7 % (ref 11.5–15.5)
WBC: 2.9 10*3/uL — ABNORMAL LOW (ref 4.0–10.5)

## 2013-06-24 LAB — ETHANOL: Alcohol, Ethyl (B): 197 mg/dL — ABNORMAL HIGH (ref 0–11)

## 2013-06-24 LAB — SALICYLATE LEVEL: Salicylate Lvl: <2 mg/dL — ABNORMAL LOW (ref 2.8–20.0)

## 2013-06-24 LAB — ACETAMINOPHEN LEVEL: Acetaminophen (Tylenol), Serum: <15 ug/mL (ref 10–30)

## 2013-06-24 LAB — ABO/RH: ABO/RH(D): O POS

## 2013-06-24 LAB — PREPARE RBC (CROSSMATCH)

## 2013-06-24 SURGERY — WOUND EXPLORATION
Anesthesia: General | Site: Wrist | Laterality: Right

## 2013-06-24 MED ORDER — 0.9 % SODIUM CHLORIDE (POUR BTL) OPTIME
TOPICAL | Status: DC | PRN
  Administered 2013-06-24: 1000 mL

## 2013-06-24 MED ORDER — CEFAZOLIN SODIUM-DEXTROSE 2-3 GM-% IV SOLR
INTRAVENOUS | Status: DC | PRN
  Administered 2013-06-24: 2 g via INTRAVENOUS

## 2013-06-24 MED ORDER — LIDOCAINE HCL (CARDIAC) 20 MG/ML IV SOLN
INTRAVENOUS | Status: AC
  Filled 2013-06-24: qty 5

## 2013-06-24 MED ORDER — METOCLOPRAMIDE HCL 5 MG/ML IJ SOLN
INTRAMUSCULAR | Status: AC
  Filled 2013-06-24: qty 2

## 2013-06-24 MED ORDER — TETANUS-DIPHTH-ACELL PERTUSSIS 5-2.5-18.5 LF-MCG/0.5 IM SUSP
0.5000 mL | Freq: Once | INTRAMUSCULAR | Status: AC
  Administered 2013-06-24: 0.5 mL via INTRAMUSCULAR
  Filled 2013-06-24: qty 0.5

## 2013-06-24 MED ORDER — ONDANSETRON HCL 4 MG PO TABS
4.0000 mg | ORAL_TABLET | Freq: Four times a day (QID) | ORAL | Status: DC | PRN
  Administered 2013-06-29: 4 mg via ORAL
  Filled 2013-06-24 (×2): qty 1

## 2013-06-24 MED ORDER — EPHEDRINE SULFATE 50 MG/ML IJ SOLN
INTRAMUSCULAR | Status: AC
  Filled 2013-06-24: qty 1

## 2013-06-24 MED ORDER — SUCCINYLCHOLINE CHLORIDE 20 MG/ML IJ SOLN
INTRAMUSCULAR | Status: DC | PRN
  Administered 2013-06-24: 100 mg via INTRAVENOUS

## 2013-06-24 MED ORDER — MIDAZOLAM HCL 5 MG/5ML IJ SOLN
INTRAMUSCULAR | Status: DC | PRN
  Administered 2013-06-24: 2 mg via INTRAVENOUS

## 2013-06-24 MED ORDER — PNEUMOCOCCAL VAC POLYVALENT 25 MCG/0.5ML IJ INJ
0.5000 mL | INJECTION | INTRAMUSCULAR | Status: AC
  Administered 2013-06-25: 0.5 mL via INTRAMUSCULAR
  Filled 2013-06-24: qty 0.5

## 2013-06-24 MED ORDER — SODIUM CHLORIDE 0.9 % IJ SOLN
INTRAMUSCULAR | Status: AC
  Filled 2013-06-24: qty 10

## 2013-06-24 MED ORDER — METOCLOPRAMIDE HCL 5 MG/ML IJ SOLN
10.0000 mg | Freq: Once | INTRAMUSCULAR | Status: AC | PRN
  Administered 2013-06-24: 10 mg via INTRAVENOUS

## 2013-06-24 MED ORDER — CEFAZOLIN SODIUM-DEXTROSE 2-3 GM-% IV SOLR
2.0000 g | INTRAVENOUS | Status: AC
  Filled 2013-06-24: qty 50

## 2013-06-24 MED ORDER — PROPOFOL 10 MG/ML IV BOLUS
INTRAVENOUS | Status: AC
  Filled 2013-06-24: qty 20

## 2013-06-24 MED ORDER — SODIUM CHLORIDE 0.9 % IJ SOLN
3.0000 mL | Freq: Two times a day (BID) | INTRAMUSCULAR | Status: DC
  Administered 2013-06-25 – 2013-06-30 (×6): 3 mL via INTRAVENOUS

## 2013-06-24 MED ORDER — HYDROMORPHONE HCL PF 1 MG/ML IJ SOLN
INTRAMUSCULAR | Status: AC
  Filled 2013-06-24: qty 1

## 2013-06-24 MED ORDER — SODIUM CHLORIDE 0.9 % IV SOLN
INTRAVENOUS | Status: DC
  Administered 2013-06-24: 14:00:00 via INTRAVENOUS

## 2013-06-24 MED ORDER — PROPOFOL 10 MG/ML IV BOLUS
INTRAVENOUS | Status: DC | PRN
  Administered 2013-06-24: 200 mg via INTRAVENOUS

## 2013-06-24 MED ORDER — FENTANYL CITRATE 0.05 MG/ML IJ SOLN
75.0000 ug | Freq: Once | INTRAMUSCULAR | Status: AC
  Administered 2013-06-24: 75 ug via INTRAVENOUS
  Filled 2013-06-24: qty 2

## 2013-06-24 MED ORDER — SODIUM CHLORIDE 0.9 % IV SOLN
INTRAVENOUS | Status: DC
  Administered 2013-06-25 – 2013-06-26 (×3): via INTRAVENOUS

## 2013-06-24 MED ORDER — LIDOCAINE HCL (CARDIAC) 20 MG/ML IV SOLN
INTRAVENOUS | Status: DC | PRN
  Administered 2013-06-24: 80 mg via INTRAVENOUS

## 2013-06-24 MED ORDER — ONDANSETRON HCL 4 MG/2ML IJ SOLN
4.0000 mg | Freq: Once | INTRAMUSCULAR | Status: AC
  Administered 2013-06-24: 4 mg via INTRAVENOUS
  Filled 2013-06-24: qty 2

## 2013-06-24 MED ORDER — BUPIVACAINE HCL (PF) 0.25 % IJ SOLN
INTRAMUSCULAR | Status: DC | PRN
  Administered 2013-06-24: 9 mL

## 2013-06-24 MED ORDER — ONDANSETRON HCL 4 MG/2ML IJ SOLN
4.0000 mg | Freq: Four times a day (QID) | INTRAMUSCULAR | Status: DC | PRN
  Administered 2013-06-24 – 2013-06-29 (×4): 4 mg via INTRAVENOUS
  Filled 2013-06-24 (×4): qty 2

## 2013-06-24 MED ORDER — HYDROMORPHONE HCL PF 1 MG/ML IJ SOLN
0.2500 mg | INTRAMUSCULAR | Status: DC | PRN
  Administered 2013-06-24 (×2): 0.5 mg via INTRAVENOUS

## 2013-06-24 MED ORDER — FENTANYL CITRATE 0.05 MG/ML IJ SOLN
INTRAMUSCULAR | Status: DC | PRN
  Administered 2013-06-24: 100 ug via INTRAVENOUS
  Administered 2013-06-24: 50 ug via INTRAVENOUS
  Administered 2013-06-24: 100 ug via INTRAVENOUS

## 2013-06-24 MED ORDER — ONDANSETRON HCL 4 MG/2ML IJ SOLN
4.0000 mg | Freq: Once | INTRAMUSCULAR | Status: DC
  Filled 2013-06-24: qty 2

## 2013-06-24 MED ORDER — CEFAZOLIN SODIUM 1 G IJ SOLR
1.0000 g | Freq: Once | INTRAMUSCULAR | Status: AC
  Administered 2013-06-24: 1 g via INTRAMUSCULAR
  Filled 2013-06-24: qty 10

## 2013-06-24 MED ORDER — PROMETHAZINE HCL 25 MG/ML IJ SOLN
INTRAMUSCULAR | Status: AC
  Filled 2013-06-24: qty 1

## 2013-06-24 MED ORDER — ROCURONIUM BROMIDE 50 MG/5ML IV SOLN
INTRAVENOUS | Status: AC
  Filled 2013-06-24: qty 1

## 2013-06-24 MED ORDER — SULFAMETHOXAZOLE-TMP DS 800-160 MG PO TABS
1.0000 | ORAL_TABLET | Freq: Two times a day (BID) | ORAL | Status: AC
  Administered 2013-06-25 – 2013-07-01 (×14): 1 via ORAL
  Filled 2013-06-24 (×15): qty 1

## 2013-06-24 MED ORDER — ONDANSETRON HCL 4 MG/2ML IJ SOLN
INTRAMUSCULAR | Status: AC
  Filled 2013-06-24: qty 2

## 2013-06-24 MED ORDER — STERILE WATER FOR INJECTION IJ SOLN
INTRAMUSCULAR | Status: AC
  Filled 2013-06-24: qty 10

## 2013-06-24 MED ORDER — BUPIVACAINE HCL (PF) 0.25 % IJ SOLN
INTRAMUSCULAR | Status: AC
  Filled 2013-06-24: qty 30

## 2013-06-24 MED ORDER — HYDROCODONE-ACETAMINOPHEN 5-325 MG PO TABS
1.0000 | ORAL_TABLET | ORAL | Status: DC | PRN
  Administered 2013-06-25: 2 via ORAL
  Administered 2013-06-25: 1 via ORAL
  Administered 2013-06-25: 2 via ORAL
  Administered 2013-06-25: 1 via ORAL
  Administered 2013-06-26 – 2013-06-30 (×7): 2 via ORAL
  Filled 2013-06-24 (×2): qty 2
  Filled 2013-06-24: qty 1
  Filled 2013-06-24 (×9): qty 2

## 2013-06-24 MED ORDER — PROMETHAZINE HCL 25 MG/ML IJ SOLN
6.2500 mg | Freq: Once | INTRAMUSCULAR | Status: AC
  Administered 2013-06-24: 6.25 mg via INTRAVENOUS

## 2013-06-24 MED ORDER — MIDAZOLAM HCL 2 MG/2ML IJ SOLN
INTRAMUSCULAR | Status: AC
  Filled 2013-06-24: qty 2

## 2013-06-24 MED ORDER — ONDANSETRON HCL 4 MG/2ML IJ SOLN
INTRAMUSCULAR | Status: AC
  Administered 2013-06-24: 4 mg
  Filled 2013-06-24: qty 2

## 2013-06-24 MED ORDER — HYDROMORPHONE HCL PF 1 MG/ML IJ SOLN
1.0000 mg | INTRAMUSCULAR | Status: DC | PRN
  Administered 2013-06-24 – 2013-07-01 (×19): 1 mg via INTRAVENOUS
  Filled 2013-06-24 (×20): qty 1

## 2013-06-24 MED ORDER — ONDANSETRON HCL 4 MG/2ML IJ SOLN
4.0000 mg | Freq: Once | INTRAMUSCULAR | Status: AC
  Administered 2013-06-24: 4 mg via INTRAVENOUS

## 2013-06-24 MED ORDER — OXYCODONE HCL 5 MG PO TABS: 5.0000 mg | ORAL_TABLET | Freq: Once | ORAL | Status: DC | PRN

## 2013-06-24 MED ORDER — SUCCINYLCHOLINE CHLORIDE 20 MG/ML IJ SOLN
INTRAMUSCULAR | Status: AC
  Filled 2013-06-24: qty 1

## 2013-06-24 MED ORDER — LACTATED RINGERS IV SOLN
INTRAVENOUS | Status: DC | PRN
  Administered 2013-06-24 (×2): via INTRAVENOUS

## 2013-06-24 MED ORDER — OXYCODONE HCL 5 MG/5ML PO SOLN: 5.0000 mg | Freq: Once | ORAL | Status: DC | PRN

## 2013-06-24 MED ORDER — FENTANYL CITRATE 0.05 MG/ML IJ SOLN
INTRAMUSCULAR | Status: AC
  Filled 2013-06-24: qty 5

## 2013-06-24 SURGICAL SUPPLY — 59 items
BAG DECANTER FOR FLEXI CONT (MISCELLANEOUS) IMPLANT
BANDAGE ELASTIC 3 VELCRO ST LF (GAUZE/BANDAGES/DRESSINGS) ×3 IMPLANT
BANDAGE GAUZE ELAST BULKY 4 IN (GAUZE/BANDAGES/DRESSINGS) ×2 IMPLANT
BLADE MINI RND TIP GREEN BEAV (BLADE) IMPLANT
BLADE SURG 15 STRL LF DISP TIS (BLADE) ×4 IMPLANT
BLADE SURG 15 STRL SS (BLADE) ×6
BNDG CMPR 9X4 STRL LF SNTH (GAUZE/BANDAGES/DRESSINGS) ×2
BNDG ESMARK 4X9 LF (GAUZE/BANDAGES/DRESSINGS) ×2 IMPLANT
BNDG GAUZE ELAST 4 BULKY (GAUZE/BANDAGES/DRESSINGS) ×3 IMPLANT
CHLORAPREP W/TINT 26ML (MISCELLANEOUS) ×1 IMPLANT
CORDS BIPOLAR (ELECTRODE) ×3 IMPLANT
COVER MAYO STAND STRL (DRAPES) ×1 IMPLANT
COVER TABLE BACK 60X90 (DRAPES) ×1 IMPLANT
CUFF TOURNIQUET SINGLE 18IN (TOURNIQUET CUFF) IMPLANT
DECANTER SPIKE VIAL GLASS SM (MISCELLANEOUS) ×3 IMPLANT
DRAPE EXTREMITY T 121X128X90 (DRAPE) ×1 IMPLANT
DRAPE SURG 17X23 STRL (DRAPES) ×3 IMPLANT
GAUZE XEROFORM 1X8 LF (GAUZE/BANDAGES/DRESSINGS) ×3 IMPLANT
GLOVE BIO SURGEON STRL SZ7.5 (GLOVE) ×3 IMPLANT
GLOVE BIOGEL PI IND STRL 8 (GLOVE) ×3 IMPLANT
GLOVE BIOGEL PI INDICATOR 8 (GLOVE) ×2
GOWN BRE IMP PREV XXLGXLNG (GOWN DISPOSABLE) ×3 IMPLANT
GOWN STRL REUS W/ TWL XL LVL3 (GOWN DISPOSABLE) ×2 IMPLANT
GOWN STRL REUS W/TWL XL LVL3 (GOWN DISPOSABLE) ×3
KIT BASIN OR (CUSTOM PROCEDURE TRAY) ×3 IMPLANT
LOOP VESSEL MAXI BLUE (MISCELLANEOUS) ×2 IMPLANT
NDL HYPO 25X1 1.5 SAFETY (NEEDLE) IMPLANT
NDL SAFETY ECLIPSE 18X1.5 (NEEDLE) IMPLANT
NEEDLE HYPO 18GX1.5 SHARP (NEEDLE)
NEEDLE HYPO 25X1 1.5 SAFETY (NEEDLE) ×3 IMPLANT
NS IRRIG 1000ML POUR BTL (IV SOLUTION) ×3 IMPLANT
PACK ORTHO EXTREMITY (CUSTOM PROCEDURE TRAY) ×2 IMPLANT
PAD CAST 3X4 CTTN HI CHSV (CAST SUPPLIES) ×2 IMPLANT
PAD CAST 4YDX4 CTTN HI CHSV (CAST SUPPLIES) ×1 IMPLANT
PADDING CAST ABS 4INX4YD NS (CAST SUPPLIES) ×1
PADDING CAST ABS COTTON 4X4 ST (CAST SUPPLIES) ×2 IMPLANT
PADDING CAST COTTON 3X4 STRL (CAST SUPPLIES) ×3
PADDING CAST COTTON 4X4 STRL (CAST SUPPLIES) ×3
SET CYSTO W/LG BORE CLAMP LF (SET/KITS/TRAYS/PACK) IMPLANT
SPEAR EYE SURG WECK-CEL (MISCELLANEOUS) ×3 IMPLANT
SPLINT PLASTER CAST XFAST 3X15 (CAST SUPPLIES) ×1 IMPLANT
SPLINT PLASTER XTRA FASTSET 3X (CAST SUPPLIES) ×1
SPONGE GAUZE 4X4 12PLY (GAUZE/BANDAGES/DRESSINGS) ×3 IMPLANT
STOCKINETTE 4X48 STRL (DRAPES) ×1 IMPLANT
SUT ETHIBOND 3-0 V-5 (SUTURE) IMPLANT
SUT ETHILON 4 0 PS 2 18 (SUTURE) ×3 IMPLANT
SUT FIBERWIRE 4-0 18 TAPR NDL (SUTURE)
SUT MERSILENE 6 0 P 1 (SUTURE) IMPLANT
SUT NYLON 9 0 VRM6 (SUTURE) IMPLANT
SUT PROLENE 6 0 P 1 18 (SUTURE) IMPLANT
SUT SILK 4 0 PS 2 (SUTURE) IMPLANT
SUT VICRYL 4-0 PS2 18IN ABS (SUTURE) IMPLANT
SUTURE FIBERWR 4-0 18 TAPR NDL (SUTURE) IMPLANT
SYR BULB 3OZ (MISCELLANEOUS) ×3 IMPLANT
SYR CONTROL 10ML LL (SYRINGE) ×2 IMPLANT
TOWEL OR 17X24 6PK STRL BLUE (TOWEL DISPOSABLE) ×6 IMPLANT
TUBE CONNECTING 12X1/4 (SUCTIONS) ×2 IMPLANT
TUBING CYSTO DISP (UROLOGICAL SUPPLIES) ×2 IMPLANT
UNDERPAD 30X30 INCONTINENT (UNDERPADS AND DIAPERS) ×3 IMPLANT

## 2013-06-24 NOTE — ED Notes (Signed)
Pt complain of nausea still. Has vomited three times since in the ED after initial dose of zofran

## 2013-06-24 NOTE — ED Notes (Signed)
Pt states her son died in October and she just could not handle it anymore. States she cut her right wrist with a razor blade but does not know what time she did it. States she also drank three beers

## 2013-06-24 NOTE — Transfer of Care (Signed)
Immediate Anesthesia Transfer of Care Note  Patient: Angel Price  Procedure(s) Performed: Procedure(s): exploration of traumatic wound right wrist (Right)  Patient Location: PACU  Anesthesia Type:General  Level of Consciousness: awake, alert  and oriented  Airway & Oxygen Therapy: Patient Spontanous Breathing and Patient connected to nasal cannula oxygen  Post-op Assessment: Report given to PACU RN, Post -op Vital signs reviewed and stable and Patient moving all extremities X 4  Post vital signs: Reviewed and stable  Complications: No apparent anesthesia complications

## 2013-06-24 NOTE — Anesthesia Procedure Notes (Signed)
Procedure Name: Intubation Date/Time: 06/24/2013 8:48 PM Performed by: Valetta Fuller Pre-anesthesia Checklist: Patient identified and Emergency Drugs available Patient Re-evaluated:Patient Re-evaluated prior to inductionOxygen Delivery Method: Circle system utilized Preoxygenation: Pre-oxygenation with 100% oxygen Intubation Type: IV induction, Rapid sequence and Cricoid Pressure applied Laryngoscope Size: Miller and 2 Grade View: Grade I Tube type: Oral Tube size: 7.5 mm Number of attempts: 1 Airway Equipment and Method: Stylet Placement Confirmation: ETT inserted through vocal cords under direct vision,  positive ETCO2 and breath sounds checked- equal and bilateral Secured at: 21 cm Tube secured with: Tape Dental Injury: Teeth and Oropharynx as per pre-operative assessment

## 2013-06-24 NOTE — Anesthesia Postprocedure Evaluation (Signed)
Anesthesia Post Note  Patient: Angel Price  Procedure(s) Performed: Procedure(s) (LRB): exploration of traumatic wound right wrist (Right)  Anesthesia type: General  Patient location: PACU  Post pain: Pain level controlled  Post assessment: Patient's Cardiovascular Status Stable  Last Vitals:  Filed Vitals:   06/24/13 2209  BP:   Pulse: 113  Temp:   Resp: 21    Post vital signs: Reviewed and stable  Level of consciousness: alert  Complications: No apparent anesthesia complications

## 2013-06-24 NOTE — Discharge Instructions (Signed)

## 2013-06-24 NOTE — Op Note (Signed)
[id number] 

## 2013-06-24 NOTE — Anesthesia Preprocedure Evaluation (Signed)
Anesthesia Evaluation  Patient identified by MRN, date of birth, ID band Patient awake    Reviewed: Allergy & Precautions, H&P , NPO status , Patient's Chart, lab work & pertinent test results, reviewed documented beta blocker date and time   Airway Mallampati: II TM Distance: >3 FB Neck ROM: full    Dental   Pulmonary Current Smoker,  breath sounds clear to auscultation        Cardiovascular hypertension, On Medications Rhythm:regular     Neuro/Psych  Headaches, PSYCHIATRIC DISORDERS Anxiety Depression Bipolar Disorder negative psych ROS   GI/Hepatic negative GI ROS, Neg liver ROS, (+)     substance abuse  alcohol use, cocaine use and marijuana use,   Endo/Other  negative endocrine ROSHypothyroidism   Renal/GU negative Renal ROS  negative genitourinary   Musculoskeletal   Abdominal   Peds  Hematology negative hematology ROS (+)   Anesthesia Other Findings See surgeon's H&P   Reproductive/Obstetrics negative OB ROS                           Anesthesia Physical Anesthesia Plan  ASA: III and emergent  Anesthesia Plan: General   Post-op Pain Management:    Induction: Intravenous, Rapid sequence and Cricoid pressure planned  Airway Management Planned: Oral ETT  Additional Equipment:   Intra-op Plan:   Post-operative Plan:   Informed Consent: I have reviewed the patients History and Physical, chart, labs and discussed the procedure including the risks, benefits and alternatives for the proposed anesthesia with the patient or authorized representative who has indicated his/her understanding and acceptance.   Dental Advisory Given  Plan Discussed with: CRNA and Surgeon  Anesthesia Plan Comments:         Anesthesia Quick Evaluation

## 2013-06-24 NOTE — ED Notes (Signed)
Orthopedic consult at bedside for evaluation.

## 2013-06-24 NOTE — H&P (Addendum)
Angel Price is an 48 y.o. female.   Chief Complaint: right wrist laceration HPI: 48 yo lhd female found by mother today with reportedly intentional laceration to right wrist.  Patient states she is a cutter.  Per ED physician at [facility], patient upset about loss of her son.  Significant bleeding at [facility] controlled by pressure dressing.  Patient transferred to [facility] for further care.  Patient reports no previous injury or surgery to right arm and no other injury at this time.  Past Medical History  Diagnosis Date  . Lupus   . 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 Hypertension     Past Surgical History  Procedure Laterality Date  . Abdominal surgery    . Abdominal hysterectomy  2013    [place]  . Debridement of abdominal wall abscess N/A 02/08/2013    Procedure: DEBRIDEMENT OF ABDOMINAL WALL ABSCESS;  Surgeon: Jamesetta So, MD;  Location: [facility];  Service: General;  Laterality: N/A;  . Gastric bypass       [place], first 2000, 2005/2006.  . Colonoscopy      in [place]  . Cholecystectomy    . Colonoscopy with propofol N/A 05/20/2013    Procedure: COLONOSCOPY WITH PROPOFOL (Procedure #2) At cecum'@0957'  total withdrawal time=;  Surgeon: Daneil Dolin, MD;  Location: [facility];  Service: Endoscopy;  Laterality: N/A;  . Esophagogastroduodenoscopy (egd) with propofol N/A 05/20/2013    Procedure: ESOPHAGOGASTRODUODENOSCOPY (EGD) WITH PROPOFOL (Procedure #1);  Surgeon: Daneil Dolin, MD;  Location: [facility];  Service: Endoscopy;  Laterality: N/A;  . Esophageal biopsy  05/20/2013    Procedure: BIOPSIES OF ASCENDING AND SIGMOID COLON;  Surgeon: Daneil Dolin, MD;  Location: [facility];  Service: Endoscopy;;    Family History  Problem Relation Age of Onset  . Brain cancer Son   . Breast cancer Maternal Aunt   . Colon cancer Maternal Grandmother     late 66s, early 36s  . Lung cancer Father   . Liver disease Neg Hx    Social History:  reports that she has been smoking  Cigarettes.  She has a 35 pack-year smoking history. She has never used smokeless tobacco. She reports that she drinks alcohol. She reports that she does not use illicit drugs.  Allergies:  Allergies  Allergen Reactions  . Codeine Hives  . Morphine And Related     gastritis     (Not in a hospital admission)  Results for orders placed during the hospital encounter of 06/24/13 (from the past 48 hour(s))  ETHANOL     Status: Abnormal   Collection Time    06/24/13  2:10 PM      Result Value Ref Range   Alcohol, Ethyl (B) 197 (*) 0 - 11 mg/dL   Comment:            LOWEST DETECTABLE LIMIT FOR     SERUM ALCOHOL IS 11 mg/dL     FOR MEDICAL PURPOSES ONLY  URINE RAPID DRUG SCREEN (HOSP PERFORMED)     Status: None   Collection Time    06/24/13  2:10 PM      Result Value Ref Range   Opiates NONE DETECTED  NONE DETECTED   Cocaine NONE DETECTED  NONE DETECTED   Benzodiazepines NONE DETECTED  NONE DETECTED   Amphetamines NONE DETECTED  NONE DETECTED   Tetrahydrocannabinol NONE DETECTED  NONE DETECTED   Barbiturates NONE DETECTED  NONE DETECTED   Comment:  DRUG SCREEN FOR MEDICAL PURPOSES     ONLY.  IF CONFIRMATION IS NEEDED     FOR ANY PURPOSE, NOTIFY LAB     WITHIN 5 DAYS.                LOWEST DETECTABLE LIMITS     FOR URINE DRUG SCREEN     Drug Class       Cutoff (ng/mL)     Amphetamine      1000     Barbiturate      200     Benzodiazepine   256     Tricyclics       389     Opiates          300     Cocaine          300     THC              50  CBC WITH DIFFERENTIAL     Status: Abnormal   Collection Time    06/24/13  2:10 PM      Result Value Ref Range   WBC 3.2 (*) 4.0 - 10.5 K/uL   RBC 2.91 (*) 3.87 - 5.11 MIL/uL   Hemoglobin 9.0 (*) 12.0 - 15.0 g/dL   HCT 25.1 (*) 36.0 - 46.0 %   MCV 86.3  78.0 - 100.0 fL   MCH 30.9  26.0 - 34.0 pg   MCHC 35.9  30.0 - 36.0 g/dL   RDW 14.9  11.5 - 15.5 %   Platelets 214  150 - 400 K/uL   Neutrophils Relative % 39 (*) 43 -  77 %   Neutro Abs 1.2 (*) 1.7 - 7.7 K/uL   Lymphocytes Relative 47 (*) 12 - 46 %   Lymphs Abs 1.5  0.7 - 4.0 K/uL   Monocytes Relative 13 (*) 3 - 12 %   Monocytes Absolute 0.4  0.1 - 1.0 K/uL   Eosinophils Relative 1  0 - 5 %   Eosinophils Absolute 0.0  0.0 - 0.7 K/uL   Basophils Relative 0  0 - 1 %   Basophils Absolute 0.0  0.0 - 0.1 K/uL  COMPREHENSIVE METABOLIC PANEL     Status: Abnormal   Collection Time    06/24/13  2:10 PM      Result Value Ref Range   Sodium 141  137 - 147 mEq/L   Potassium 3.9  3.7 - 5.3 mEq/L   Chloride 104  96 - 112 mEq/L   CO2 17 (*) 19 - 32 mEq/L   Glucose, Bld 104 (*) 70 - 99 mg/dL   BUN 4 (*) 6 - 23 mg/dL   Creatinine, Ser 0.55  0.50 - 1.10 mg/dL   Calcium 7.8 (*) 8.4 - 10.5 mg/dL   Total Protein 5.9 (*) 6.0 - 8.3 g/dL   Albumin 2.8 (*) 3.5 - 5.2 g/dL   AST 112 (*) 0 - 37 U/L   ALT 40 (*) 0 - 35 U/L   Alkaline Phosphatase 70  39 - 117 U/L   Total Bilirubin 0.4  0.3 - 1.2 mg/dL   GFR calc non Af Amer >90  >90 mL/min   GFR calc Af Amer >90  >90 mL/min   Comment: (NOTE)     The eGFR has been calculated using the CKD EPI equation.     This calculation has not been validated in all clinical situations.     eGFR's persistently <90 mL/min signify possible Chronic  Kidney     Disease.  SALICYLATE LEVEL     Status: Abnormal   Collection Time    06/24/13  2:10 PM      Result Value Ref Range   Salicylate Lvl <6.9 (*) 2.8 - 20.0 mg/dL  ACETAMINOPHEN LEVEL     Status: None   Collection Time    06/24/13  2:10 PM      Result Value Ref Range   Acetaminophen (Tylenol), Serum <15.0  10 - 30 ug/mL   Comment:            THERAPEUTIC CONCENTRATIONS VARY     SIGNIFICANTLY. A RANGE OF 10-30     ug/mL MAY BE AN EFFECTIVE     CONCENTRATION FOR MANY PATIENTS.     HOWEVER, SOME ARE BEST TREATED     AT CONCENTRATIONS OUTSIDE THIS     RANGE.     ACETAMINOPHEN CONCENTRATIONS     >150 ug/mL AT 4 HOURS AFTER     INGESTION AND >50 ug/mL AT 12     HOURS AFTER  INGESTION ARE     OFTEN ASSOCIATED WITH TOXIC     REACTIONS.  TYPE AND SCREEN     Status: None   Collection Time    06/24/13  3:02 PM      Result Value Ref Range   ABO/RH(D) O POS     Antibody Screen NEG     Sample Expiration 06/27/2013    CBC     Status: Abnormal   Collection Time    06/24/13  5:44 PM      Result Value Ref Range   WBC 2.9 (*) 4.0 - 10.5 K/uL   RBC 2.94 (*) 3.87 - 5.11 MIL/uL   Hemoglobin 8.9 (*) 12.0 - 15.0 g/dL   HCT 25.0 (*) 36.0 - 46.0 %   MCV 85.0  78.0 - 100.0 fL   MCH 30.3  26.0 - 34.0 pg   MCHC 35.6  30.0 - 36.0 g/dL   RDW 14.7  11.5 - 15.5 %   Platelets 200  150 - 400 K/uL  TYPE AND SCREEN     Status: None   Collection Time    06/24/13  6:20 PM      Result Value Ref Range   ABO/RH(D) O POS     Antibody Screen PENDING     Sample Expiration 06/27/2013    PREPARE RBC (CROSSMATCH)     Status: None   Collection Time    06/24/13  6:20 PM      Result Value Ref Range   Order Confirmation ORDER PROCESSED BY BLOOD BANK      No results found.   A comprehensive review of systems was negative except for: Gastrointestinal: positive for nausea and vomiting  Blood pressure 131/86, pulse 103, temperature 98.6 F (37 C), temperature source Oral, resp. rate 21, SpO2 100.00%.  General appearance: alert, cooperative and appears stated age Head: Normocephalic, without obvious abnormality, atraumatic Neck: supple, symmetrical, trachea midline Resp: clear to auscultation bilaterally Cardio: regular rate and rhythm GI: tender secondary to active care of abdominal wall abscess Extremities: decreased sensation in digits of right hand.  poor flexion of digits, though digits held in normal cascade.  brisk capillary refill all digits.  transverse laceration at wrist into subcutaneous tissue.  unable to probe further in ED.  had pressure dressing on wrist at arrival.  bleeding controlled. left UE: intact sensation and capillary refill all digits.  +epl/fpl/io.  No  wounds. Pulses: 2+  and symmetric Skin: Skin color, texture, turgor normal. No rashes or lesions Neurologic: Grossly normal except as above Incision/Wound: As above  Assessment/Plan Right wrist laceration.  Recommend OR for exploration of wound with repair of nerve, artery, tendon as necessary.  Risks, benefits, and alternatives of surgery were discussed and the patient agrees with the plan of care.   Tennis Must 06/24/2013, 6:52 PM  Addendum: 06/24/13 2016. Revised abdominal exam.  Left UE exam added.

## 2013-06-24 NOTE — H&P (Signed)
[facility] History and Physical  Angel Price TOI:[id number] DOB: [birth year] DOA: 06/24/2013  Referring physician: ED physician PCP: Doran Heater, MD   Chief Complaint: suicide attempt   HPI:  Pt is 48 yo female who was [facility] ED via EMS after found mother found her in the pool of blood from what appeared to be secondary to slicing the volar aspect of the right wrist. Pt unable to provide history due to somewhat altered mental status, per EMS report, copious amount of blood noted at the scene. Pt apparently attempted suicide and apparently diagnosed with depression 6 months ago after death of her child. She is currently reporting pain in the right wrist area, numbness and loss of sensation, unable to flex her wrist.   In ED, pt noted to be somnolent on arrival, Hg drop noted since March 22nd, 2015 (Hg was 15), today Hg = 9. Hand surgery specialist consulted and [facility] asked to admit for further evaluation.   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  Code Status: Full Family Communication: No family at bedside  Disposition Plan: Admit for further evaluation     Review of Systems:  Unable to obtain as pt is in significant pain and unable to focus enough to provide detailed history     Past Medical History  Diagnosis Date  . Lupus   . 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 Hypertension     Past Surgical History  Procedure Laterality Date  .  Abdominal surgery    . Abdominal hysterectomy  2013    [place]  . Debridement of abdominal wall abscess N/A 02/08/2013    Procedure: DEBRIDEMENT OF ABDOMINAL WALL ABSCESS;  Surgeon: Jamesetta So, MD;  Location: [facility];  Service: General;  Laterality: N/A;  . Gastric bypass       [place], first 2000, 2005/2006.  . Colonoscopy      in [place]  . Cholecystectomy    . Colonoscopy with propofol N/A 05/20/2013    Procedure: COLONOSCOPY WITH PROPOFOL (Procedure #2) At cecum@0957  total withdrawal time=;  Surgeon: Daneil Dolin, MD;  Location: [facility];  Service: Endoscopy;  Laterality: N/A;  . Esophagogastroduodenoscopy (egd) with propofol N/A 05/20/2013    Procedure: ESOPHAGOGASTRODUODENOSCOPY (EGD) WITH PROPOFOL (Procedure #1);  Surgeon: Daneil Dolin, MD;  Location: [facility];  Service: Endoscopy;  Laterality: N/A;  . Esophageal biopsy  05/20/2013    Procedure: BIOPSIES OF ASCENDING AND SIGMOID COLON;  Surgeon: Daneil Dolin, MD;  Location: [facility];  Service: Endoscopy;;    Social History:  reports that she has been smoking Cigarettes.  She has a 35 pack-year smoking history. She has never used smokeless tobacco. She reports that she drinks alcohol. She reports that she does not use illicit drugs.  Allergies  Allergen Reactions  . Codeine Hives  . Morphine And Related     gastritis    Family History  Problem Relation Age of Onset  . Brain  cancer Son   . Breast cancer Maternal Aunt   . Colon cancer Maternal Grandmother     late 3s, early 43s  . Lung cancer Father   . Liver disease Neg Hx     Prior to Admission medications   Medication Sig Start Date End Date Taking? Authorizing Provider  lisinopril (PRINIVIL) 10 MG tablet Take 1 tablet (10 mg total) by mouth daily. 03/30/13  Yes Doran Heater, MD  ondansetron (ZOFRAN-ODT) 4 MG disintegrating tablet Take 4 mg by mouth every 8 (eight) hours as needed for nausea or vomiting. 4mg  ODT q4 hours prn nausea/vomit 06/06/13  Yes Maudry Diego,  MD  HYDROcodone-acetaminophen (NORCO/VICODIN) 5-325 MG per tablet Take 1 tablet by mouth every 6 (six) hours as needed for moderate pain. 06/06/13   Maudry Diego, MD  sulfamethoxazole-trimethoprim (SEPTRA DS) 800-160 MG per tablet Take 1 tablet by mouth 2 (two) times daily. 06/06/13   Maudry Diego, MD    Physical Exam: Filed Vitals:   06/24/13 1745 06/24/13 1800 06/24/13 1830 06/24/13 1845  BP: 129/87 115/71 129/91 131/86  Pulse: 112 98 102 103  Temp:      TempSrc:      Resp: 18 26 17 21   SpO2: 100% 100% 100% 100%    Physical Exam  Constitutional: Appears to be in mild distress due to pain  HENT: Normocephalic. External right and left ear normal. Dry MM Eyes: Conjunctivae and EOM are normal. PERRLA, no scleral icterus.  Neck: Normal ROM. Neck supple. No JVD. No tracheal deviation. No thyromegaly.  CVS: Regular rhythm, tachycardic, S1/S2 +, no murmurs, no gallops, no carotid bruit.  Pulmonary: Effort and breath sounds normal, no stridor, rhonchi, wheezes, rales.  Abdominal: Soft. BS +,  no distension, tenderness, rebound or guarding.  Musculoskeletal: Normal range of motion. No edema and no tenderness. Decreased sensation in the right wrist, unable to flex wrist, bleeding controlled.  Lymphadenopathy: No lymphadenopathy noted, cervical, inguinal. Neuro: Alert. No cranial nerve deficit. Skin: Skin is warm and dry. No rash noted. Not diaphoretic. No erythema. No pallor.  Psychiatric: Difficult to assess as pt is in pain   Labs on Admission:  Basic Metabolic Panel:  Recent Labs Lab 06/24/13 1410  NA 141  K 3.9  CL 104  CO2 17*  GLUCOSE 104*  BUN 4*  CREATININE 0.55  CALCIUM 7.8*   Liver Function Tests:  Recent Labs Lab 06/24/13 1410  AST 112*  ALT 40*  ALKPHOS 70  BILITOT 0.4  PROT 5.9*  ALBUMIN 2.8*   CBC:  Recent Labs Lab 06/24/13 1410 06/24/13 1744  WBC 3.2* 2.9*  NEUTROABS 1.2*  --   HGB 9.0* 8.9*  HCT 25.1* 25.0*  MCV 86.3 85.0  PLT 214 200     EKG: Normal sinus rhythm, no ST/T wave changes  Theodis Blaze, MD  [facility] Pager [phone]  If 7PM-7AM, please contact night-coverage [email or web address] Password [facility] 06/24/2013, 6:59 PM

## 2013-06-24 NOTE — Brief Op Note (Signed)
06/24/2013  9:20 PM  PATIENT:  Angel Price  48 y.o. female  PRE-OPERATIVE DIAGNOSIS:  right wrist laceration  POST-OPERATIVE DIAGNOSIS:  right wrist laceration  PROCEDURE:  Procedure(s): exploration of traumatic wound right wrist (Right)  SURGEON:  Surgeon(s) and Role:    * Tennis Must, MD - Primary  PHYSICIAN ASSISTANT:   ASSISTANTS: none   ANESTHESIA:   general  EBL:     BLOOD ADMINISTERED:none  DRAINS: vessel loop drain  LOCAL MEDICATIONS USED:  MARCAINE     SPECIMEN:  No Specimen  DISPOSITION OF SPECIMEN:  N/A  COUNTS:  YES  TOURNIQUET:   Total Tourniquet Time Documented: Upper Arm (Right) - 26 minutes Total: Upper Arm (Right) - 26 minutes   DICTATION: .Other Dictation: Dictation Number [id number]  PLAN OF CARE: Discharge to home after PACU  PATIENT DISPOSITION:  PACU - hemodynamically stable.

## 2013-06-24 NOTE — ED Notes (Signed)
Pt has bandage across abdomen. States she had an infection in her stomach and had to have surgery

## 2013-06-24 NOTE — ED Notes (Addendum)
Transported from [facility].  Pt was drinking last night and sliced open right wrist.  Deep lacerations present.  Pulses and sensory present per [facility], no motor skills in right hand present.  Received 3L of NS.  GCS 15 on arrival.  Abdominal surgery was done in Nov. 2014 with multiple infections.  Nausea present on daily basis.

## 2013-06-24 NOTE — ED Notes (Signed)
Pt transported to [facility] via [facility] 

## 2013-06-24 NOTE — ED Notes (Signed)
Per EMS, called out to pt's home regarding suicide attempt . States they were called out by pt's mom. Pt was found lying in a pool of blood with a laceration to right wrist. IV started by EMS,  Narcan 1 mg and ns 900 cc given by EMS prior to arrival

## 2013-06-24 NOTE — ED Provider Notes (Addendum)
CSN: [id number]     Arrival date & time 06/24/13  1328 History   First MD Initiated Contact with Patient 06/24/13 1331     Chief Complaint  Patient presents with  . Suicide Attempt     (Consider location/radiation/quality/duration/timing/severity/associated sxs/prior Treatment) The history is provided by the patient.   Patient here After a suicide attempt which involved her taking a razor blade and slicing the volar aspect of her right wrist. Copious blood was noted at the scene according to EMS. Patient is to drinking alcohol this evening. Admits that this was a suicide attempt caused by being depressed from the death of her child 6 months ago. States that she has been given with severe depression since then. Denies any intentional ingestions of aspirin or Tylenol. Mother called EMS after finding her in a pool of blood. EMS gave the patient a liter of saline as well as a milligram of Narcan and transported patient. Patient complains of sharp pain in her right wrist as well as numbness to her right thumb. She is unable to feel or flex her wrist on the right. Bleeding has been controlled with direct pressure. Past Medical History  Diagnosis Date  . Lupus   . 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 Hypertension    Past Surgical History  Procedure Laterality Date  . Abdominal surgery    . Abdominal hysterectomy  2013    [place]  . Debridement of abdominal wall abscess N/A 02/08/2013    Procedure: DEBRIDEMENT OF ABDOMINAL WALL ABSCESS;  Surgeon: Jamesetta So, MD;  Location: [facility];  Service: General;  Laterality: N/A;  . Gastric bypass       [place], first 2000, 2005/2006.  . Colonoscopy      in [place]  . Cholecystectomy    . Colonoscopy with propofol N/A 05/20/2013    Procedure: COLONOSCOPY WITH PROPOFOL (Procedure #2) At cecum@0957  total withdrawal time=;  Surgeon: Daneil Dolin, MD;  Location: [facility];  Service: Endoscopy;  Laterality: N/A;  . Esophagogastroduodenoscopy  (egd) with propofol N/A 05/20/2013    Procedure: ESOPHAGOGASTRODUODENOSCOPY (EGD) WITH PROPOFOL (Procedure #1);  Surgeon: Daneil Dolin, MD;  Location: [facility];  Service: Endoscopy;  Laterality: N/A;  . Esophageal biopsy  05/20/2013    Procedure: BIOPSIES OF ASCENDING AND SIGMOID COLON;  Surgeon: Daneil Dolin, MD;  Location: [facility];  Service: Endoscopy;;   Family History  Problem Relation Age of Onset  . Brain cancer Son   . Breast cancer Maternal Aunt   . Colon cancer Maternal Grandmother     late 62s, early 25s  . Lung cancer Father   . Liver disease Neg Hx    History  Substance Use Topics  . Smoking status: Current Every Day Smoker -- 1.00 packs/day for 35 years    Types: Cigarettes  . Smokeless tobacco: Never Used  . Alcohol Use: Yes     Comment: beer or liquor daily until "buzz" at least. often in morning. states cut back lately.    OB History   Grav Para Term Preterm Abortions TAB SAB Ect Mult Living                 Review of Systems  All other systems reviewed and are negative.     Allergies  Codeine and Morphine and related  Home Medications   Current Outpatient Rx  Name  Route  Sig  Dispense  Refill  . HYDROcodone-acetaminophen (NORCO/VICODIN) 5-325 MG per tablet  Oral   Take 1 tablet by mouth every 6 (six) hours as needed for moderate pain.   20 tablet   0   . lisinopril (PRINIVIL) 10 MG tablet   Oral   Take 1 tablet (10 mg total) by mouth daily.   30 tablet   1   . ondansetron (ZOFRAN ODT) 4 MG disintegrating tablet      4mg  ODT q4 hours prn nausea/vomit   12 tablet   0   . ondansetron (ZOFRAN) 4 MG tablet   Oral   Take 1 tablet (4 mg total) by mouth every 6 (six) hours as needed for nausea or vomiting.   30 tablet   1   . sulfamethoxazole-trimethoprim (SEPTRA DS) 800-160 MG per tablet   Oral   Take 1 tablet by mouth 2 (two) times daily.   20 tablet   0    There were no vitals taken for this visit. Physical Exam  Nursing note and  vitals reviewed. Constitutional: She is oriented to person, place, and time. She appears well-developed and well-nourished. She appears lethargic.  Non-toxic appearance. No distress.  HENT:  Head: Normocephalic and atraumatic.  Eyes: Conjunctivae, EOM and lids are normal. Pupils are equal, round, and reactive to light.  Neck: Normal range of motion. Neck supple. No tracheal deviation present. No mass present.  Cardiovascular: Regular rhythm and normal heart sounds.  Tachycardia present.  Exam reveals no gallop.   No murmur heard. Pulmonary/Chest: Effort normal and breath sounds normal. No stridor. No respiratory distress. She has no decreased breath sounds. She has no wheezes. She has no rhonchi. She has no rales.  Abdominal: Soft. Normal appearance and bowel sounds are normal. She exhibits no distension. There is no tenderness. There is no rebound and no CVA tenderness.  Musculoskeletal: Normal range of motion. She exhibits no edema and no tenderness.       Arms: Neurological: She is oriented to person, place, and time. She appears lethargic. No cranial nerve deficit or sensory deficit. GCS eye subscore is 4. GCS verbal subscore is 5. GCS motor subscore is 6.  Remaining of the patient's neuro exam is normal with the exception of her right upper exam which is documented elsewhere.  Skin: Skin is warm and dry. No abrasion and no rash noted.  Psychiatric: Her affect is blunt. Her speech is delayed. She is withdrawn. She expresses suicidal ideation. She expresses suicidal plans. She expresses no homicidal plans.    ED Course  Procedures (including critical care time) Labs Review Labs Reviewed  ETHANOL  URINE RAPID DRUG SCREEN (HOSP PERFORMED)  CBC WITH DIFFERENTIAL  COMPREHENSIVE METABOLIC PANEL  SALICYLATE LEVEL  ACETAMINOPHEN LEVEL  POC URINE PREG, ED   Imaging Review No results found.   EKG Interpretation   Date/Time:  Thursday June 24 2013 14:23:36 EDT Ventricular Rate:   116 PR Interval:  132 QRS Duration: 68 QT Interval:  348 QTC Calculation: 483 R Axis:   64 Text Interpretation:  Sinus tachycardia Otherwise normal ECG When compared  with ECG of 06-Jun-2013 18:49, No significant change was found Confirmed  by Zenia Resides  MD, Shoni Quijas ([id number]) on 06/24/2013 2:40:12 PM      MDM   Final diagnoses:  None    Patient given IV fluids here and Zofran for nausea. Patient's hemoglobin has dropped 5 g since March 22 of this year. She's been typed and screened and will order blood transfusion. Patient given 1 g of Ancef and tetanus status updated. She will  require exploration of her wrist laceration. Patient has maintain her airway. Heart rate has improved with IV fluids. I will consult hand surgeon at [facility] and arrange for transfer. Have spoken with Dr.kuzmas nurse and patient to be sent to the ED at [facility]. Have also spoken with the EDP at [facility], dr Reather Converse  CRITICAL CARE Performed by: Leota Jacobsen Total critical care time: 50 Critical care time was exclusive of separately billable procedures and treating other patients. Critical care was necessary to treat or prevent imminent or life-threatening deterioration. Critical care was time spent personally by me on the following activities: development of treatment plan with patient and/or surrogate as well as nursing, discussions with consultants, evaluation of patient's response to treatment, examination of patient, obtaining history from patient or surrogate, ordering and performing treatments and interventions, ordering and review of laboratory studies, ordering and review of radiographic studies, pulse oximetry and re-evaluation of patient's condition.     Leota Jacobsen, MD 06/24/13 [place], MD 06/24/13 [place] Meosha Castanon, MD 06/24/13 [phone]

## 2013-06-24 NOTE — OR Nursing (Signed)
Blue vessel loop to right wrist wound for dainage

## 2013-06-25 DIAGNOSIS — I10 Essential (primary) hypertension: Secondary | ICD-10-CM

## 2013-06-25 DIAGNOSIS — X838XXA Intentional self-harm by other specified means, initial encounter: Secondary | ICD-10-CM | POA: Diagnosis not present

## 2013-06-25 DIAGNOSIS — R7989 Other specified abnormal findings of blood chemistry: Secondary | ICD-10-CM | POA: Diagnosis not present

## 2013-06-25 DIAGNOSIS — S61509A Unspecified open wound of unspecified wrist, initial encounter: Secondary | ICD-10-CM | POA: Diagnosis not present

## 2013-06-25 LAB — CBC
HEMATOCRIT: 18.7 % — AB (ref 36.0–46.0)
Hemoglobin: 6.8 g/dL — CL (ref 12.0–15.0)
MCH: 31.2 pg (ref 26.0–34.0)
MCHC: 36.4 g/dL — AB (ref 30.0–36.0)
MCV: 85.8 fL (ref 78.0–100.0)
Platelets: 175 10*3/uL (ref 150–400)
RBC: 2.18 MIL/uL — ABNORMAL LOW (ref 3.87–5.11)
RDW: 14.7 % (ref 11.5–15.5)
WBC: 5.8 10*3/uL (ref 4.0–10.5)

## 2013-06-25 LAB — HEPATIC FUNCTION PANEL
ALT: 25 U/L (ref 0–35)
AST: 46 U/L — ABNORMAL HIGH (ref 0–37)
Albumin: 2.6 g/dL — ABNORMAL LOW (ref 3.5–5.2)
Alkaline Phosphatase: 52 U/L (ref 39–117)
BILIRUBIN TOTAL: 0.6 mg/dL (ref 0.3–1.2)
Bilirubin, Direct: <0.2 mg/dL (ref 0.0–0.3)
Total Protein: 5 g/dL — ABNORMAL LOW (ref 6.0–8.3)

## 2013-06-25 LAB — BASIC METABOLIC PANEL
BUN: <3 mg/dL — ABNORMAL LOW (ref 6–23)
CHLORIDE: 106 meq/L (ref 96–112)
CO2: 19 mEq/L (ref 19–32)
CREATININE: 0.6 mg/dL (ref 0.50–1.10)
Calcium: 6.9 mg/dL — ABNORMAL LOW (ref 8.4–10.5)
Glucose, Bld: 85 mg/dL (ref 70–99)
Potassium: 3.3 mEq/L — ABNORMAL LOW (ref 3.7–5.3)
Sodium: 138 mEq/L (ref 137–147)

## 2013-06-25 LAB — MAGNESIUM: MAGNESIUM: 1.3 mg/dL — AB (ref 1.5–2.5)

## 2013-06-25 LAB — PREPARE RBC (CROSSMATCH)

## 2013-06-25 MED ORDER — POTASSIUM CHLORIDE CRYS ER 20 MEQ PO TBCR
40.0000 meq | EXTENDED_RELEASE_TABLET | Freq: Once | ORAL | Status: AC
  Administered 2013-06-25: 40 meq via ORAL
  Filled 2013-06-25: qty 2

## 2013-06-25 MED ORDER — SODIUM CHLORIDE 0.9 % IV BOLUS (SEPSIS)
500.0000 mL | Freq: Once | INTRAVENOUS | Status: AC
  Administered 2013-06-25: 500 mL via INTRAVENOUS

## 2013-06-25 MED ORDER — LISINOPRIL 10 MG PO TABS
10.0000 mg | ORAL_TABLET | Freq: Every day | ORAL | Status: DC
  Administered 2013-06-25: 10 mg via ORAL
  Filled 2013-06-25 (×2): qty 1

## 2013-06-25 MED ORDER — DIPHENHYDRAMINE HCL 25 MG PO CAPS
25.0000 mg | ORAL_CAPSULE | Freq: Four times a day (QID) | ORAL | Status: DC | PRN
  Administered 2013-06-25 – 2013-06-27 (×4): 25 mg via ORAL
  Administered 2013-06-28 – 2013-06-29 (×2): 50 mg via ORAL
  Administered 2013-06-30: 25 mg via ORAL
  Administered 2013-07-01 – 2013-07-02 (×2): 50 mg via ORAL
  Filled 2013-06-25 (×2): qty 1
  Filled 2013-06-25 (×2): qty 2
  Filled 2013-06-25 (×2): qty 1
  Filled 2013-06-25: qty 2
  Filled 2013-06-25 (×3): qty 1
  Filled 2013-06-25: qty 2
  Filled 2013-06-25 (×3): qty 1

## 2013-06-25 NOTE — Op Note (Signed)
Angel Price, Angel Price              ACCOUNT NO.:  [id number]  MEDICAL RECORD NO.:  [id number]  LOCATION:  5N25C                        FACILITY:  [facility]  PHYSICIAN:  Leanora Cover, MD        DATE OF BIRTH:  March 01, 1966  DATE OF PROCEDURE:  06/24/2013 DATE OF DISCHARGE:                              OPERATIVE REPORT   POSTOPERATIVE DIAGNOSIS:  Right wrist laceration, possible tendon artery and nerve injury.  POSTOPERATIVE DIAGNOSIS:  Right wrist laceration.  PROCEDURE:  Exploration of traumatic wound, right wrist.  SURGEON:  Leanora Cover, MD.  ASSISTANT:  None.  ANESTHESIA:  General IV fluids per anesthesia flow sheet.  ESTIMATED BLOOD LOSS:  Minimal.  COMPLICATIONS:  None.  SPECIMENS:  None.  TIME OF TOURNIQUET:  26 minutes.  DISPOSITION:  Stable to PACU.  INDICATIONS:  Ms. Arvin is a 48 year old left-hand dominant female, who reportedly lacerated her right wrist intentionally earlier today. She was seen in [facility] emergency department where she was found to have significant bleeding from the wrist.  She was noted to have inability to flex her fingers and decreased sensation.  She was transferred to [facility] for further care.  I had been consulted for management of injury.  On examination, she noted decreased sensation in the fingertips.  She had brisk capillary refill in all fingertips.  The fingers were held in normal cascade, but she was unable to flex any digits or the thumb.  She had a laceration coursing across the entire width of the wrist at the wrist flexion crease.  Bleeding was controlled.  I discussed Ms. Tufaro nature of the injury.  They recommended going to the operating room for exploration of the wound. Risks, benefits, and alternatives of surgery were discussed including risk of blood loss, infection, damage to nerves, vessels, tendons, ligaments, bone; failure of surgery; need for additional surgery, complications with wound healing, continued pain, and  need for repeat procedures.  She voiced understanding of these risks and elected to proceed.  OPERATIVE COURSE:  After being identified preoperatively by myself, the patient and I agreed upon procedure and site procedure.  Surgical site was marked.  The risks, benefits, and alternatives of surgery were reviewed and she wished to proceed.  Surgical consent had been signed. She was given IV Ancef as preoperative antibiotic prophylaxis.  Her tetanus had been updated at [facility].  She had been given antibiotics at that time as well.  She was transferred to the operating room and placed in the operating room table in a supine position with the right upper extremity on arm board.  General anesthesia was induced by Anesthesiology.  Right upper extremity was prepped and draped in normal sterile orthopedic fashion.  Surgical pause was performed between surgeons, anesthesia, operating staff, and all were in agreement as to the patient, procedure, and site of procedure.  Tourniquet at the proximal aspect of the extremity was inflated to 250 mmHg after exsanguination of the limb with an Esmarch bandage.  The wound was explored.  It was into the subcutaneous tissues.  It went deeper at the ulnar side of the wrist.  There was a shallow laceration more proximally that did not go  through the dermis.  The wound was explored.  Palmaris longus was intact.  The superficial branch of the radial artery in the palmar cutaneous branch of the median nerve were identified and were intact.  The radial artery was visualized and was intact.  The palmaris longus was intact.  The median nerve was identified underneath the palmaris longus was intact as well.  The laceration was at the level of pisiform.  The FCU tendon was able to be palpated.  The ulnar nerve and artery were identified and were intact.  The finger flexor tendons were deep to the traumatic portion of the wound.  They were identified on the ulnar  side and were intact.  The wound had been copiously irrigated with 500 mL of sterile saline prior to any exploration.  At this time, the wound was again copiously irrigated with a 1000 mL of sterile saline. It was felt that there was no structure requiring repair.  The bipolar electrocautery was used to obtain hemostasis from small bleeding veins in the subcutaneous tissues.  The wound was closed with 4-0 nylon suture in a horizontal mattress fashion.  Vessel loop drain had been placed across the wound to allow for drainage as necessary as the exact timing of the wound is unknown though she was taken to the hospital approximately 6 hours prior to surgery.  The wound was injected with 10 mL of 0.25% plain Marcaine to aid in postoperative analgesia.  It was then dressed with sterile Xeroform, 4x4s, and wrapped with Kerlix bandage.  A volar splint was placed and wrapped with Kerlix and Ace bandage.  Tourniquet was deflated at 26 minutes.  Fingertips were pink with brisk capillary refill after deflation of tourniquet.  Operative drapes were broken down.  The patient was awoken from anesthesia safely. She was transferred back to stretcher and taken to PACU in stable condition.  She is being admitted overnight by the hospitalist for psych evaluation and placement as appropriate.  She will follow up with me in the office at the beginning of next week.     Leanora Cover, MD     KK/MEDQ  D:  06/24/2013  T:  06/25/2013  Job:  [id number]

## 2013-06-25 NOTE — Progress Notes (Signed)
UR completed. Revella Shelton RN CCM Case Mgmt phone [phone] 

## 2013-06-25 NOTE — Progress Notes (Signed)
[facility]                                                                              Patient Demographics  Angel Price, is a 48 y.o. female, DOB - 08-15-1965, SWN:[id number]  Admit date - 06/24/2013   Admitting Physician Theodis Blaze, MD  Outpatient Primary MD for the patient is Doran Heater, MD  LOS - 1   Chief Complaint  Patient presents with  . Suicide Attempt        Assessment & Plan   Suicide attempt by lacerating right wrist -Orthopedics was consulted and conducted exploration of the wound -Psychiatry is also been consulted -Patient currently denies any suicidal ideations -Attempted do to loss of a son to brain cancer -Continue sitter -Continue Bactrim and cefazolin  Leukopenia  -Unclear etiology -Improving will continue to monitor CBC  Symptomatic Acute blood loss anemia -Secondary to acute injury -Patient currently tachycardic -Hemoglobin 6.8, were all transfuse 2 units packed red blood cells -Will continue to monitor CBC  Transaminitis -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  Consults   Orthopedic surgery Psychiatry  DVT Prophylaxis  SCDs  Lab Results  Component Value Date   PLT 175 06/25/2013    Medications  Scheduled Meds: .  ceFAZolin (ANCEF) IV  2 g Intravenous On Call to OR  . HYDROmorphone      . metoCLOPramide      . pneumococcal 23 valent vaccine  0.5 mL Intramuscular Tomorrow-1000  . promethazine      . sodium chloride  3  mL Intravenous Q12H  . sulfamethoxazole-trimethoprim  1 tablet Oral Q12H   Continuous Infusions: . sodium chloride     PRN Meds:.diphenhydrAMINE, HYDROcodone-acetaminophen, HYDROmorphone (DILAUDID) injection, ondansetron (ZOFRAN) IV, ondansetron  Antibiotics    Anti-infectives   Start     Dose/Rate Route Frequency Ordered Stop   06/25/13 0600  ceFAZolin (ANCEF) IVPB 2 g/50 mL premix     2 g 100 mL/hr over 30 Minutes Intravenous On call to O.R. 06/24/13 2243 06/26/13 0559   06/24/13 2245  sulfamethoxazole-trimethoprim (BACTRIM DS) 800-160 MG per tablet 1 tablet     1 tablet Oral Every 12 hours 06/24/13 2243 07/01/13 2159   06/24/13 1515  ceFAZolin (ANCEF) injection 1 g     1 g Intramuscular  Once 06/24/13 1507 06/24/13 1523        Subjective:   Angel Price seen and examined today.  Patient states she attempted to kill herself as she lost her son to brain cancer. She no longer feels suicidal or homicidal. She has no other complaints, denies chest pain, dizziness, shortness of breath.   Objective:   Filed Vitals:   06/24/13 2213 06/24/13 2234 06/25/13 0132 06/25/13 0451  BP: 110/65 112/76 111/72 109/69  Pulse: 114 101 108 103  Temp: 98.1 F (36.7 C) 98.4 F (36.9 C) 98.3 F (36.8 C) 98.1 F (36.7 C)  TempSrc:  Oral Oral Oral  Resp: 18 16 14 16   SpO2: 100% 100% 98% 99%    Wt Readings from Last 3 Encounters:  06/06/13 59.421 kg (131 lb)  05/14/13 63.957 kg (141 lb)  05/12/13 63.957 kg (141 lb)     Intake/Output Summary (Last 24 hours) at 06/25/13 0859 Last data filed at 06/25/13 3295  Gross per 24 hour  Intake   3710 ml  Output      0 ml  Net   3710 ml    Exam  General: Well developed, well nourished, NAD, appears stated age  HEENT: NCAT, PERRLA, EOMI, Anicteic Sclera, mucous membranes moist.   Neck: Supple, no JVD, no masses  Cardiovascular: S1 S2 auscultated, no rubs, murmurs or gallops. Regular rate and rhythm.  Respiratory: Clear to auscultation  bilaterally with equal chest rise  Abdomen: Soft, nontender, nondistended, + bowel sounds  Extremities: warm dry without cyanosis clubbing or edema. Right upper extremity currently wrapped  Neuro: AAOx3, cranial nerves grossly intact. Strength 5/5 in patient's upper and lower extremities bilaterally  Skin: Without rashes exudates or nodules, multiple tattoos   Psych: Normal affect and demeanor with intact judgement and insight  Data Review   Micro Results No results found for this or any previous visit (from the past 240 hour(s)).  Radiology Reports Ct Abdomen Pelvis W Contrast  06/06/2013   CLINICAL DATA:  Abdominal pain.  EXAM: CT ABDOMEN AND PELVIS WITH CONTRAST  TECHNIQUE: Multidetector CT imaging of the abdomen and pelvis was performed using the standard protocol following bolus administration of intravenous contrast.  CONTRAST:  133mL OMNIPAQUE IOHEXOL 300 MG/ML  SOLN  COMPARISON:  CT of the abdomen and pelvis 05/04/2013.  FINDINGS: Lung Bases: Unremarkable.  Abdomen/Pelvis: Status post cholecystectomy, gastric bypass procedure and hysterectomy. Numerous surgical clips are noted superior and medial to the right kidney, potentially from prior right adrenalectomy. 2 cm left adrenal nodule is incompletely characterized, but unchanged compared to prior examination dating back to 04/18/2013 (and recently characterized as an adenoma on MRI 06/03/2013). Common bile duct appears slightly dilated measuring 9 mm in the porta hepatis, however, this is stable compared to prior studies. No significant intrahepatic biliary ductal dilatation. The appearance of the liver, pancreas, spleen and bilateral kidneys is unremarkable.  No significant volume of ascites. No pneumoperitoneum. No pathologic distention of small bowel. No definite lymphadenopathy identified within the abdomen or pelvis. Atherosclerosis throughout the abdominal and pelvic vasculature, without evidence of aneurysm. Ovaries are not  confidently identified may be surgically absent or atrophic. Urinary bladder is unremarkable in appearance.  As with prior examinations, there is some amorphous soft tissue density in the anterior abdominal wall around the umbilicus, similar to prior study from 05/04/2013 which is favored to represent sequela of prior wound infection with anterior abdominal wall abscesses or fistulous tracts. Given the stability in size, persistent intra abdominal wall abscesses are unlikely, and this is favored to represent chronic postoperative scar tissue at this time.  Musculoskeletal: There are no aggressive appearing lytic or blastic lesions noted in the visualized portions of the skeleton.  IMPRESSION: 1. No acute findings in the abdomen or pelvis to account for the patient's symptoms. 2. Persistent prominence of abnormal soft tissue in the anterior abdominal wall around the umbilicus, likely to represent resolving postoperative scar tissue. No  definite abdominal wall abscess is confidently identified at this time, although the presence of persistent infected tissue with draining sinus tracts is not excluded, and clinical correlation with physical exam is recommended. 3. 2 cm left adrenal adenoma is unchanged. 4. Postoperative changes redemonstrated, as above. 5. Persistent prominence of the extrahepatic bile ducts, similar to prior studies. This is favored to be functional postcholecystectomy bile duct dilatation rather than evidence of obstruction, particularly in correlation with prior MRI 06/03/2013 (see that report for further details).   Electronically Signed   By: Vinnie Langton M.D.   On: 06/06/2013 21:38   Mr 3d Recon At Scanner  06/03/2013   CLINICAL DATA:  Biliary tract dilatation.  EXAM: MRI ABDOMEN WITHOUT AND WITH CONTRAST (INCLUDING MRCP)  TECHNIQUE: Multiplanar multisequence MR imaging of the abdomen was performed both before and after the administration of intravenous contrast. Heavily T2-weighted images  of the biliary and pancreatic ducts were obtained, and three-dimensional MRCP images were rendered by post processing.  CONTRAST:  1mL MULTIHANCE GADOBENATE DIMEGLUMINE 529 MG/ML IV SOLN  COMPARISON:  CT of the abdomen and pelvis 05/04/2013.  FINDINGS: MRCP images are slightly suboptimal related to gross patient motion. With these limitations in mind, these images demonstrate minimal intrahepatic biliary ductal dilatation. The common hepatic duct measures up to 9 mm. The cystic duct remnant is also slightly prominent measuring up to 9 mm. The common bile duct also measures 9 mm. The distal common bile duct abruptly tapers immediately before the level of the ampulla. No filling defects within the duct  1.8 x 1.5 cm left adrenal nodule demonstrates profound loss of signal intensity throughout the nodule on out of phase dual echo images, compatible with high intracellular lipid in the setting of an adenoma.  Status post cholecystectomy. Mild diffuse loss of signal intensity throughout the hepatic parenchyma on out of phase dual echo images, indicative of very mild hepatic steatosis. No focal hepatic lesions are noted. The appearance of the pancreas, spleen and bilateral kidneys is unremarkable. Specifically, no pancreatic ductal dilatation. Extensive susceptibility artifact noted throughout the upper abdomen in the region of the gastroesophageal junction, right upper retroperitoneum (related to prior right adrenalectomy), and adjacent to the stomach, related to clips from prior surgeries.  IMPRESSION: 1. Very mild intra and extrahepatic biliary ductal dilatation, as above. This appears very similar to the prior CT scan 05/04/2013, although today's study clearly demonstrates that the perceived ductal enlargement in the porta hepatis was actually a combination of the adjacent common hepatic duct and cystic duct remnants rather than one large common bile duct as it appeared to be on the prior CT scan. The common hepatic  duct, cystic duct remnant and common bile duct are all mildly dilated measuring up to 9 mm, and there is very mild intrahepatic biliary ductal dilatation. Although much of this could be attributable to functional dilatation in this post cholecystectomy patient, the common bile duct does abruptly taper immediately before the level of the ampulla such that a distal CBD stricture is not excluded. If there is clinical concern for biliary obstruction, further evaluation with ERCP may be warranted. 2. 1.8 x 1.5 cm left adrenal adenoma. 3. Mild hepatic steatosis. 4. Additional incidental findings, as above.   Electronically Signed   By: Vinnie Langton M.D.   On: 06/03/2013 10:02   Mr Abd W/wo Cm/mrcp  06/03/2013   CLINICAL DATA:  Biliary tract dilatation.  EXAM: MRI ABDOMEN WITHOUT AND WITH CONTRAST (INCLUDING MRCP)  TECHNIQUE: Multiplanar multisequence MR  imaging of the abdomen was performed both before and after the administration of intravenous contrast. Heavily T2-weighted images of the biliary and pancreatic ducts were obtained, and three-dimensional MRCP images were rendered by post processing.  CONTRAST:  47mL MULTIHANCE GADOBENATE DIMEGLUMINE 529 MG/ML IV SOLN  COMPARISON:  CT of the abdomen and pelvis 05/04/2013.  FINDINGS: MRCP images are slightly suboptimal related to gross patient motion. With these limitations in mind, these images demonstrate minimal intrahepatic biliary ductal dilatation. The common hepatic duct measures up to 9 mm. The cystic duct remnant is also slightly prominent measuring up to 9 mm. The common bile duct also measures 9 mm. The distal common bile duct abruptly tapers immediately before the level of the ampulla. No filling defects within the duct  1.8 x 1.5 cm left adrenal nodule demonstrates profound loss of signal intensity throughout the nodule on out of phase dual echo images, compatible with high intracellular lipid in the setting of an adenoma.  Status post cholecystectomy.  Mild diffuse loss of signal intensity throughout the hepatic parenchyma on out of phase dual echo images, indicative of very mild hepatic steatosis. No focal hepatic lesions are noted. The appearance of the pancreas, spleen and bilateral kidneys is unremarkable. Specifically, no pancreatic ductal dilatation. Extensive susceptibility artifact noted throughout the upper abdomen in the region of the gastroesophageal junction, right upper retroperitoneum (related to prior right adrenalectomy), and adjacent to the stomach, related to clips from prior surgeries.  IMPRESSION: 1. Very mild intra and extrahepatic biliary ductal dilatation, as above. This appears very similar to the prior CT scan 05/04/2013, although today's study clearly demonstrates that the perceived ductal enlargement in the porta hepatis was actually a combination of the adjacent common hepatic duct and cystic duct remnants rather than one large common bile duct as it appeared to be on the prior CT scan. The common hepatic duct, cystic duct remnant and common bile duct are all mildly dilated measuring up to 9 mm, and there is very mild intrahepatic biliary ductal dilatation. Although much of this could be attributable to functional dilatation in this post cholecystectomy patient, the common bile duct does abruptly taper immediately before the level of the ampulla such that a distal CBD stricture is not excluded. If there is clinical concern for biliary obstruction, further evaluation with ERCP may be warranted. 2. 1.8 x 1.5 cm left adrenal adenoma. 3. Mild hepatic steatosis. 4. Additional incidental findings, as above.   Electronically Signed   By: Vinnie Langton M.D.   On: 06/03/2013 10:02    CBC  Recent Labs Lab 06/24/13 1410 06/24/13 1744 06/25/13 0745  WBC 3.2* 2.9* 5.8  HGB 9.0* 8.9* 6.8*  HCT 25.1* 25.0* 18.7*  PLT 214 200 175  MCV 86.3 85.0 85.8  MCH 30.9 30.3 31.2  MCHC 35.9 35.6 36.4*  RDW 14.9 14.7 14.7  LYMPHSABS 1.5  --    --   MONOABS 0.4  --   --   EOSABS 0.0  --   --   BASOSABS 0.0  --   --     Chemistries   Recent Labs Lab 06/24/13 1410  NA 141  K 3.9  CL 104  CO2 17*  GLUCOSE 104*  BUN 4*  CREATININE 0.55  CALCIUM 7.8*  AST 112*  ALT 40*  ALKPHOS 70  BILITOT 0.4   ------------------------------------------------------------------------------------------------------------------ CrCl is unknown because both a height and weight (above a minimum accepted value) are required for this calculation. ------------------------------------------------------------------------------------------------------------------ No results found for this  basename: HGBA1C,  in the last 72 hours ------------------------------------------------------------------------------------------------------------------ No results found for this basename: CHOL, HDL, LDLCALC, TRIG, CHOLHDL, LDLDIRECT,  in the last 72 hours ------------------------------------------------------------------------------------------------------------------ No results found for this basename: TSH, T4TOTAL, FREET3, T3FREE, THYROIDAB,  in the last 72 hours ------------------------------------------------------------------------------------------------------------------ No results found for this basename: VITAMINB12, FOLATE, FERRITIN, TIBC, IRON, RETICCTPCT,  in the last 72 hours  Coagulation profile No results found for this basename: INR, PROTIME,  in the last 168 hours  No results found for this basename: DDIMER,  in the last 72 hours  Cardiac Enzymes No results found for this basename: CK, CKMB, TROPONINI, MYOGLOBIN,  in the last 168 hours ------------------------------------------------------------------------------------------------------------------ No components found with this basename: POCBNP,     Lynnlee Revels D.O. on 06/25/2013 at 8:59 AM  Between 7am to 7pm - Pager - [phone]  After 7pm go to [email or web address] - password  [email or web address]  And look for the night coverage person covering for me after hours  [facility] Office  [phone]

## 2013-06-26 DIAGNOSIS — E039 Hypothyroidism, unspecified: Secondary | ICD-10-CM

## 2013-06-26 DIAGNOSIS — F319 Bipolar disorder, unspecified: Secondary | ICD-10-CM

## 2013-06-26 DIAGNOSIS — M329 Systemic lupus erythematosus, unspecified: Secondary | ICD-10-CM

## 2013-06-26 DIAGNOSIS — F1010 Alcohol abuse, uncomplicated: Secondary | ICD-10-CM

## 2013-06-26 DIAGNOSIS — F1411 Cocaine abuse, in remission: Secondary | ICD-10-CM

## 2013-06-26 LAB — COMPREHENSIVE METABOLIC PANEL
ALBUMIN: 2 g/dL — AB (ref 3.5–5.2)
ALK PHOS: 53 U/L (ref 39–117)
ALT: 18 U/L (ref 0–35)
AST: 34 U/L (ref 0–37)
CHLORIDE: 116 meq/L — AB (ref 96–112)
CO2: 18 mEq/L — ABNORMAL LOW (ref 19–32)
Calcium: 6.7 mg/dL — ABNORMAL LOW (ref 8.4–10.5)
Creatinine, Ser: 0.46 mg/dL — ABNORMAL LOW (ref 0.50–1.10)
GFR calc Af Amer: >90 mL/min (ref >90)
GFR calc non Af Amer: >90 mL/min (ref >90)
Glucose, Bld: 82 mg/dL (ref 70–99)
Potassium: 3.4 mEq/L — ABNORMAL LOW (ref 3.7–5.3)
SODIUM: 144 meq/L (ref 137–147)
Total Bilirubin: <0.2 mg/dL — ABNORMAL LOW (ref 0.3–1.2)
Total Protein: 4.3 g/dL — ABNORMAL LOW (ref 6.0–8.3)

## 2013-06-26 LAB — CBC
HEMATOCRIT: 24.4 % — AB (ref 36.0–46.0)
Hemoglobin: 8.5 g/dL — ABNORMAL LOW (ref 12.0–15.0)
MCH: 30 pg (ref 26.0–34.0)
MCHC: 34.8 g/dL (ref 30.0–36.0)
MCV: 86.2 fL (ref 78.0–100.0)
Platelets: 138 10*3/uL — ABNORMAL LOW (ref 150–400)
RBC: 2.83 MIL/uL — ABNORMAL LOW (ref 3.87–5.11)
RDW: 14.3 % (ref 11.5–15.5)
WBC: 5.7 10*3/uL (ref 4.0–10.5)

## 2013-06-26 MED ORDER — WHITE PETROLATUM GEL
Status: AC
  Filled 2013-06-26: qty 5

## 2013-06-26 MED ORDER — SODIUM CHLORIDE 0.9 % IV BOLUS (SEPSIS)
500.0000 mL | Freq: Once | INTRAVENOUS | Status: AC
  Administered 2013-06-26: 500 mL via INTRAVENOUS

## 2013-06-26 MED ORDER — MAGNESIUM SULFATE 40 MG/ML IJ SOLN
2.0000 g | Freq: Once | INTRAMUSCULAR | Status: AC
  Administered 2013-06-26: 2 g via INTRAVENOUS
  Filled 2013-06-26: qty 50

## 2013-06-26 MED ORDER — DIPHENHYDRAMINE HCL 25 MG PO CAPS
25.0000 mg | ORAL_CAPSULE | Freq: Once | ORAL | Status: AC
  Administered 2013-06-26: 25 mg via ORAL

## 2013-06-26 NOTE — Progress Notes (Signed)
[facility]                                                                              Patient Demographics  Angel Price, is a 48 y.o. female, DOB - [birth year], ZOX:[id number]  Admit date - 06/24/2013   Admitting Physician Theodis Blaze, MD  Outpatient Primary MD for the patient is Doran Heater, MD  LOS - 2   Chief Complaint  Patient presents with  . Suicide Attempt        Assessment & Plan   Suicide attempt by lacerating right wrist -Orthopedics was consulted and conducted exploration of the wound -Psychiatry consulted and pending evaluation -Patient currently denies any suicidal ideations -Attempted do to loss of a son to brain cancer -Continue sitter -Continue Bactrim and cefazolin  Leukopenia  -Unclear etiology -Improving will continue to monitor CBC  Symptomatic Acute blood loss anemia -Secondary to acute injury -Patient currently tachycardic -Hemoglobin 6.8, were all transfuse 2 units packed red blood cells -Pending CBC this morning.  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  Consults   Orthopedic surgery Psychiatry  DVT Prophylaxis  SCDs  Lab Results  Component Value Date   PLT 175 06/25/2013    Medications  Scheduled Meds: . sodium chloride  3 mL Intravenous Q12H  . sulfamethoxazole-trimethoprim  1 tablet Oral Q12H   Continuous Infusions: . sodium chloride 75 mL/hr at 06/26/13 0452   PRN  Meds:.diphenhydrAMINE, HYDROcodone-acetaminophen, HYDROmorphone (DILAUDID) injection, ondansetron (ZOFRAN) IV, ondansetron  Antibiotics    Anti-infectives   Start     Dose/Rate Route Frequency Ordered Stop   06/25/13 0600  ceFAZolin (ANCEF) IVPB 2 g/50 mL premix     2 g 100 mL/hr over 30 Minutes Intravenous On call to O.R. 06/24/13 2243 06/26/13 0559   06/24/13 2245  sulfamethoxazole-trimethoprim (BACTRIM DS) 800-160 MG per tablet 1 tablet     1 tablet Oral Every 12 hours 06/24/13 2243 07/01/13 2159   06/24/13 1515  ceFAZolin (ANCEF) injection 1 g     1 g Intramuscular  Once 06/24/13 1507 06/24/13 1523        Subjective:   [name] seen and examined today.  Patient only complains of right arm pain.  She does state that get dizzy when she gets up.  She had hypotension overnight.    Objective:   Filed Vitals:   06/26/13 0031 06/26/13 0155 06/26/13 0459 06/26/13 0707  BP: '83/56 87/64 97/70 '   Pulse: 93 91 98 92  Temp:    98.7 F (37.1 C)  TempSrc:      Resp:    18  SpO2:    99%    Wt Readings from Last  3 Encounters:  06/06/13 59.421 kg (131 lb)  05/14/13 63.957 kg (141 lb)  05/12/13 63.957 kg (141 lb)     Intake/Output Summary (Last 24 hours) at 06/26/13 0840 Last data filed at 06/26/13 0600  Gross per 24 hour  Intake 2321.25 ml  Output      0 ml  Net 2321.25 ml    Exam  General: Well developed, well nourished, NAD, appears stated age  HEENT: NCAT, PERRLA, EOMI, Anicteic Sclera, mucous membranes moist.   Neck: Supple, no JVD, no masses  Cardiovascular: S1 S2 auscultated, no rubs, murmurs or gallops. Regular rate and rhythm.  Respiratory: Clear to auscultation bilaterally with equal chest rise  Abdomen: Soft, nontender, nondistended, + bowel sounds  Extremities: warm dry without cyanosis clubbing or edema. Right upper extremity currently wrapped, right hand edema  Neuro: AAOx3, some decreased sensation in the right hand, however no other focal  deficits  Skin: Without rashes exudates or nodules, multiple tattoos   Psych: Normal affect and demeanor  Data Review   Micro Results No results found for this or any previous visit (from the past 240 hour(s)).  Radiology Reports Ct Abdomen Pelvis W Contrast  06/06/2013   CLINICAL DATA:  Abdominal pain.  EXAM: CT ABDOMEN AND PELVIS WITH CONTRAST  TECHNIQUE: Multidetector CT imaging of the abdomen and pelvis was performed using the standard protocol following bolus administration of intravenous contrast.  CONTRAST:  11m OMNIPAQUE IOHEXOL 300 MG/ML  SOLN  COMPARISON:  CT of the abdomen and pelvis 05/04/2013.  FINDINGS: Lung Bases: Unremarkable.  Abdomen/Pelvis: Status post cholecystectomy, gastric bypass procedure and hysterectomy. Numerous surgical clips are noted superior and medial to the right kidney, potentially from prior right adrenalectomy. 2 cm left adrenal nodule is incompletely characterized, but unchanged compared to prior examination dating back to 04/18/2013 (and recently characterized as an adenoma on MRI 06/03/2013). Common bile duct appears slightly dilated measuring 9 mm in the porta hepatis, however, this is stable compared to prior studies. No significant intrahepatic biliary ductal dilatation. The appearance of the liver, pancreas, spleen and bilateral kidneys is unremarkable.  No significant volume of ascites. No pneumoperitoneum. No pathologic distention of small bowel. No definite lymphadenopathy identified within the abdomen or pelvis. Atherosclerosis throughout the abdominal and pelvic vasculature, without evidence of aneurysm. Ovaries are not confidently identified may be surgically absent or atrophic. Urinary bladder is unremarkable in appearance.  As with prior examinations, there is some amorphous soft tissue density in the anterior abdominal wall around the umbilicus, similar to prior study from 05/04/2013 which is favored to represent sequela of prior wound infection with  anterior abdominal wall abscesses or fistulous tracts. Given the stability in size, persistent intra abdominal wall abscesses are unlikely, and this is favored to represent chronic postoperative scar tissue at this time.  Musculoskeletal: There are no aggressive appearing lytic or blastic lesions noted in the visualized portions of the skeleton.  IMPRESSION: 1. No acute findings in the abdomen or pelvis to account for the patient's symptoms. 2. Persistent prominence of abnormal soft tissue in the anterior abdominal wall around the umbilicus, likely to represent resolving postoperative scar tissue. No definite abdominal wall abscess is confidently identified at this time, although the presence of persistent infected tissue with draining sinus tracts is not excluded, and clinical correlation with physical exam is recommended. 3. 2 cm left adrenal adenoma is unchanged. 4. Postoperative changes redemonstrated, as above. 5. Persistent prominence of the extrahepatic bile ducts, similar to prior studies. This is favored  to be functional postcholecystectomy bile duct dilatation rather than evidence of obstruction, particularly in correlation with prior MRI 06/03/2013 (see that report for further details).   Electronically Signed   By: Vinnie Langton M.D.   On: 06/06/2013 21:38   Mr 3d Recon At Scanner  06/03/2013   CLINICAL DATA:  Biliary tract dilatation.  EXAM: MRI ABDOMEN WITHOUT AND WITH CONTRAST (INCLUDING MRCP)  TECHNIQUE: Multiplanar multisequence MR imaging of the abdomen was performed both before and after the administration of intravenous contrast. Heavily T2-weighted images of the biliary and pancreatic ducts were obtained, and three-dimensional MRCP images were rendered by post processing.  CONTRAST:  62m MULTIHANCE GADOBENATE DIMEGLUMINE 529 MG/ML IV SOLN  COMPARISON:  CT of the abdomen and pelvis 05/04/2013.  FINDINGS: MRCP images are slightly suboptimal related to gross patient motion. With these  limitations in mind, these images demonstrate minimal intrahepatic biliary ductal dilatation. The common hepatic duct measures up to 9 mm. The cystic duct remnant is also slightly prominent measuring up to 9 mm. The common bile duct also measures 9 mm. The distal common bile duct abruptly tapers immediately before the level of the ampulla. No filling defects within the duct  1.8 x 1.5 cm left adrenal nodule demonstrates profound loss of signal intensity throughout the nodule on out of phase dual echo images, compatible with high intracellular lipid in the setting of an adenoma.  Status post cholecystectomy. Mild diffuse loss of signal intensity throughout the hepatic parenchyma on out of phase dual echo images, indicative of very mild hepatic steatosis. No focal hepatic lesions are noted. The appearance of the pancreas, spleen and bilateral kidneys is unremarkable. Specifically, no pancreatic ductal dilatation. Extensive susceptibility artifact noted throughout the upper abdomen in the region of the gastroesophageal junction, right upper retroperitoneum (related to prior right adrenalectomy), and adjacent to the stomach, related to clips from prior surgeries.  IMPRESSION: 1. Very mild intra and extrahepatic biliary ductal dilatation, as above. This appears very similar to the prior CT scan 05/04/2013, although today's study clearly demonstrates that the perceived ductal enlargement in the porta hepatis was actually a combination of the adjacent common hepatic duct and cystic duct remnants rather than one large common bile duct as it appeared to be on the prior CT scan. The common hepatic duct, cystic duct remnant and common bile duct are all mildly dilated measuring up to 9 mm, and there is very mild intrahepatic biliary ductal dilatation. Although much of this could be attributable to functional dilatation in this post cholecystectomy patient, the common bile duct does abruptly taper immediately before the level of  the ampulla such that a distal CBD stricture is not excluded. If there is clinical concern for biliary obstruction, further evaluation with ERCP may be warranted. 2. 1.8 x 1.5 cm left adrenal adenoma. 3. Mild hepatic steatosis. 4. Additional incidental findings, as above.   Electronically Signed   By: DVinnie LangtonM.D.   On: 06/03/2013 10:02   Mr Abd W/wo Cm/mrcp  06/03/2013   CLINICAL DATA:  Biliary tract dilatation.  EXAM: MRI ABDOMEN WITHOUT AND WITH CONTRAST (INCLUDING MRCP)  TECHNIQUE: Multiplanar multisequence MR imaging of the abdomen was performed both before and after the administration of intravenous contrast. Heavily T2-weighted images of the biliary and pancreatic ducts were obtained, and three-dimensional MRCP images were rendered by post processing.  CONTRAST:  140mMULTIHANCE GADOBENATE DIMEGLUMINE 529 MG/ML IV SOLN  COMPARISON:  CT of the abdomen and pelvis 05/04/2013.  FINDINGS: MRCP images are  slightly suboptimal related to gross patient motion. With these limitations in mind, these images demonstrate minimal intrahepatic biliary ductal dilatation. The common hepatic duct measures up to 9 mm. The cystic duct remnant is also slightly prominent measuring up to 9 mm. The common bile duct also measures 9 mm. The distal common bile duct abruptly tapers immediately before the level of the ampulla. No filling defects within the duct  1.8 x 1.5 cm left adrenal nodule demonstrates profound loss of signal intensity throughout the nodule on out of phase dual echo images, compatible with high intracellular lipid in the setting of an adenoma.  Status post cholecystectomy. Mild diffuse loss of signal intensity throughout the hepatic parenchyma on out of phase dual echo images, indicative of very mild hepatic steatosis. No focal hepatic lesions are noted. The appearance of the pancreas, spleen and bilateral kidneys is unremarkable. Specifically, no pancreatic ductal dilatation. Extensive susceptibility  artifact noted throughout the upper abdomen in the region of the gastroesophageal junction, right upper retroperitoneum (related to prior right adrenalectomy), and adjacent to the stomach, related to clips from prior surgeries.  IMPRESSION: 1. Very mild intra and extrahepatic biliary ductal dilatation, as above. This appears very similar to the prior CT scan 05/04/2013, although today's study clearly demonstrates that the perceived ductal enlargement in the porta hepatis was actually a combination of the adjacent common hepatic duct and cystic duct remnants rather than one large common bile duct as it appeared to be on the prior CT scan. The common hepatic duct, cystic duct remnant and common bile duct are all mildly dilated measuring up to 9 mm, and there is very mild intrahepatic biliary ductal dilatation. Although much of this could be attributable to functional dilatation in this post cholecystectomy patient, the common bile duct does abruptly taper immediately before the level of the ampulla such that a distal CBD stricture is not excluded. If there is clinical concern for biliary obstruction, further evaluation with ERCP may be warranted. 2. 1.8 x 1.5 cm left adrenal adenoma. 3. Mild hepatic steatosis. 4. Additional incidental findings, as above.   Electronically Signed   By: Vinnie Langton M.D.   On: 06/03/2013 10:02    CBC  Recent Labs Lab 06/24/13 1410 06/24/13 1744 06/25/13 0745  WBC 3.2* 2.9* 5.8  HGB 9.0* 8.9* 6.8*  HCT 25.1* 25.0* 18.7*  PLT 214 200 175  MCV 86.3 85.0 85.8  MCH 30.9 30.3 31.2  MCHC 35.9 35.6 36.4*  RDW 14.9 14.7 14.7  LYMPHSABS 1.5  --   --   MONOABS 0.4  --   --   EOSABS 0.0  --   --   BASOSABS 0.0  --   --     Chemistries   Recent Labs Lab 06/24/13 1410 06/25/13 0745  NA 141 138  K 3.9 3.3*  CL 104 106  CO2 17* 19  GLUCOSE 104* 85  BUN 4* <3*  CREATININE 0.55 0.60  CALCIUM 7.8* 6.9*  MG  --  1.3*  AST 112* 46*  ALT 40* 25  ALKPHOS 70 52    BILITOT 0.4 0.6   ------------------------------------------------------------------------------------------------------------------ CrCl is unknown because both a height and weight (above a minimum accepted value) are required for this calculation. ------------------------------------------------------------------------------------------------------------------ No results found for this basename: HGBA1C,  in the last 72 hours ------------------------------------------------------------------------------------------------------------------ No results found for this basename: CHOL, HDL, LDLCALC, TRIG, CHOLHDL, LDLDIRECT,  in the last 72 hours ------------------------------------------------------------------------------------------------------------------ No results found for this basename: TSH, T4TOTAL, FREET3, T3FREE, THYROIDAB,  in the last 72 hours ------------------------------------------------------------------------------------------------------------------ No results found for this basename: VITAMINB12, FOLATE, FERRITIN, TIBC, IRON, RETICCTPCT,  in the last 72 hours  Coagulation profile No results found for this basename: INR, PROTIME,  in the last 168 hours  No results found for this basename: DDIMER,  in the last 72 hours  Cardiac Enzymes No results found for this basename: CK, CKMB, TROPONINI, MYOGLOBIN,  in the last 168 hours ------------------------------------------------------------------------------------------------------------------ No components found with this basename: POCBNP,     Maitland Lesiak D.O. on 06/26/2013 at 8:40 AM  Between 7am to 7pm - Pager - [phone]  After 7pm go to [email or web address] - password [email or web address]  And look for the night coverage person covering for me after hours  [facility] Office  [phone]

## 2013-06-26 NOTE — Progress Notes (Signed)
Upset female phones and states she is daughter of patient. Caller states she has just received news of [facility] Akers situation.  Caller deferred to family members of patient.

## 2013-06-26 NOTE — Progress Notes (Signed)
Pt's BP remains in 80s/50s following 500cc NS fluid bolus and administration of 2 units RBCs. Fredirick Maudlin notified. Received orders for an additional 500cc NS fluid bolus and the OK to administer Vicodin for pain. Nursing will continue to monitor.

## 2013-06-26 NOTE — Progress Notes (Signed)
Patient calls for analgesic, vicodin taken to room. Patient inquires where or not drug is vicodin. After confirmation, pt states that does no good and that she is not going to take it, Vicodin returned to [facility]. This am physician discussed analgesic regiment with patient at length. MD refused to change analgesic this am, no further intervention on this matter d/t MD lengthy discussion 3 hours prior.

## 2013-06-27 DIAGNOSIS — X838XXA Intentional self-harm by other specified means, initial encounter: Secondary | ICD-10-CM

## 2013-06-27 DIAGNOSIS — F332 Major depressive disorder, recurrent severe without psychotic features: Secondary | ICD-10-CM | POA: Diagnosis not present

## 2013-06-27 DIAGNOSIS — Z634 Disappearance and death of family member: Secondary | ICD-10-CM | POA: Diagnosis not present

## 2013-06-27 LAB — BASIC METABOLIC PANEL
CALCIUM: 8.5 mg/dL (ref 8.4–10.5)
CHLORIDE: 110 meq/L (ref 96–112)
CO2: 21 mEq/L (ref 19–32)
CREATININE: 0.5 mg/dL (ref 0.50–1.10)
GFR calc Af Amer: >90 mL/min (ref >90)
GFR calc non Af Amer: >90 mL/min (ref >90)
Glucose, Bld: 90 mg/dL (ref 70–99)
Potassium: 3.6 mEq/L — ABNORMAL LOW (ref 3.7–5.3)
Sodium: 142 mEq/L (ref 137–147)

## 2013-06-27 LAB — TYPE AND SCREEN
ABO/RH(D): O POS
Antibody Screen: NEGATIVE
Unit division: 0
Unit division: 0

## 2013-06-27 LAB — CBC
HEMATOCRIT: 26.4 % — AB (ref 36.0–46.0)
Hemoglobin: 9.3 g/dL — ABNORMAL LOW (ref 12.0–15.0)
MCH: 30.4 pg (ref 26.0–34.0)
MCHC: 35.2 g/dL (ref 30.0–36.0)
MCV: 86.3 fL (ref 78.0–100.0)
PLATELETS: 155 10*3/uL (ref 150–400)
RBC: 3.06 MIL/uL — ABNORMAL LOW (ref 3.87–5.11)
RDW: 14.4 % (ref 11.5–15.5)
WBC: 5.8 10*3/uL (ref 4.0–10.5)
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
## 2013-06-27 NOTE — Progress Notes (Signed)
Lower abdominal dressing change: sm puncture sites left and right of center wound. Left no drainage. Right side sm-mod amt of thick tan drainage.

## 2013-06-27 NOTE — Progress Notes (Signed)
[facility]                                                                              Patient Demographics  Angel Price, is a 48 y.o. female, DOB - 05-Nov-1965, YSA:[id number]  Admit date - 06/24/2013   Admitting Physician Theodis Blaze, MD  Outpatient Primary MD for the patient is Doran Heater, MD  LOS - 3   Chief Complaint  Patient presents with  . Suicide Attempt        Assessment & Plan   Suicide attempt by lacerating right wrist -Orthopedics was consulted and conducted exploration of the wound -Psychiatry consulted and pending evaluation (called [phone] three times) -Patient currently denies any suicidal ideations -Attempted due to loss of a son to brain cancer -Continue sitter -Continue Bactrim  -Continue pain management  Leukopenia  -Resoved, Unclear etiology  Symptomatic Acute blood loss anemia -Secondary to acute injury -Patient currently tachycardic -Hemoglobin 6.8,  2 units packed red blood cells were transfused on 4/10 -hemoglobin 9.3  Transaminitis -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  Consults   Orthopedic surgery Psychiatry  DVT Prophylaxis  SCDs  Lab Results  Component Value Date   PLT 155 06/27/2013    Medications  Scheduled Meds: . sodium chloride  3 mL Intravenous Q12H  . sulfamethoxazole-trimethoprim  1 tablet Oral Q12H   Continuous Infusions: . sodium chloride 75  mL/hr at 06/26/13 0452   PRN Meds:.diphenhydrAMINE, HYDROcodone-acetaminophen, HYDROmorphone (DILAUDID) injection, ondansetron (ZOFRAN) IV, ondansetron  Antibiotics    Anti-infectives   Start     Dose/Rate Route Frequency Ordered Stop   06/25/13 0600  ceFAZolin (ANCEF) IVPB 2 g/50 mL premix     2 g 100 mL/hr over 30 Minutes Intravenous On call to O.R. 06/24/13 2243 06/26/13 0559   06/24/13 2245  sulfamethoxazole-trimethoprim (BACTRIM DS) 800-160 MG per tablet 1 tablet     1 tablet Oral Every 12 hours 06/24/13 2243 07/01/13 2159   06/24/13 1515  ceFAZolin (ANCEF) injection 1 g     1 g Intramuscular  Once 06/24/13 1507 06/24/13 1523        Subjective:   Angel Price seen and examined today.  Patient only complains of right arm pain and would like dilaudid.  She does state that get dizzy when she gets up.    Objective:   Filed Vitals:   06/26/13 1128 06/26/13 1345 06/26/13 2020 06/27/13 0612  BP: 100/70 96/65 108/72 107/72  Pulse: 91 90 80 83  Temp: 98.2 F (36.8 C) 98.3 F (36.8 C) 98.7 F (37.1 C) 98.1 F (36.7 C)  TempSrc: Oral Oral Oral Oral  Resp: 18 16 16  18  SpO2: 100% 100% 100% 100%    Wt Readings from Last 3 Encounters:  06/06/13 59.421 kg (131 lb)  05/14/13 63.957 kg (141 lb)  05/12/13 63.957 kg (141 lb)     Intake/Output Summary (Last 24 hours) at 06/27/13 0846 Last data filed at 06/26/13 1630  Gross per 24 hour  Intake    480 ml  Output      0 ml  Net    480 ml    Exam  General: Well developed, well nourished, NAD, appears stated age  HEENT: NCAT, PERRLA, EOMI, Anicteic Sclera, mucous membranes moist.   Neck: Supple, no JVD, no masses  Cardiovascular: S1 S2 auscultated, no rubs, murmurs or gallops. Regular rate and rhythm.  Respiratory: Clear to auscultation bilaterally with equal chest rise  Abdomen: Soft, nontender, nondistended, + bowel sounds  Extremities: warm dry without cyanosis clubbing or edema. Right upper extremity currently  wrapped, right hand edema  Neuro: AAOx3, some decreased sensation in the right hand, however no other focal deficits   Skin: Without rashes exudates or nodules, multiple tattoos   Psych: Normal affect and demeanor  Data Review   Micro Results No results found for this or any previous visit (from the past 240 hour(s)).  Radiology Reports Ct Abdomen Pelvis W Contrast  06/06/2013   CLINICAL DATA:  Abdominal pain.  EXAM: CT ABDOMEN AND PELVIS WITH CONTRAST  TECHNIQUE: Multidetector CT imaging of the abdomen and pelvis was performed using the standard protocol following bolus administration of intravenous contrast.  CONTRAST:  125m OMNIPAQUE IOHEXOL 300 MG/ML  SOLN  COMPARISON:  CT of the abdomen and pelvis 05/04/2013.  FINDINGS: Lung Bases: Unremarkable.  Abdomen/Pelvis: Status post cholecystectomy, gastric bypass procedure and hysterectomy. Numerous surgical clips are noted superior and medial to the right kidney, potentially from prior right adrenalectomy. 2 cm left adrenal nodule is incompletely characterized, but unchanged compared to prior examination dating back to 04/18/2013 (and recently characterized as an adenoma on MRI 06/03/2013). Common bile duct appears slightly dilated measuring 9 mm in the porta hepatis, however, this is stable compared to prior studies. No significant intrahepatic biliary ductal dilatation. The appearance of the liver, pancreas, spleen and bilateral kidneys is unremarkable.  No significant volume of ascites. No pneumoperitoneum. No pathologic distention of small bowel. No definite lymphadenopathy identified within the abdomen or pelvis. Atherosclerosis throughout the abdominal and pelvic vasculature, without evidence of aneurysm. Ovaries are not confidently identified may be surgically absent or atrophic. Urinary bladder is unremarkable in appearance.  As with prior examinations, there is some amorphous soft tissue density in the anterior abdominal wall around the  umbilicus, similar to prior study from 05/04/2013 which is favored to represent sequela of prior wound infection with anterior abdominal wall abscesses or fistulous tracts. Given the stability in size, persistent intra abdominal wall abscesses are unlikely, and this is favored to represent chronic postoperative scar tissue at this time.  Musculoskeletal: There are no aggressive appearing lytic or blastic lesions noted in the visualized portions of the skeleton.  IMPRESSION: 1. No acute findings in the abdomen or pelvis to account for the patient's symptoms. 2. Persistent prominence of abnormal soft tissue in the anterior abdominal wall around the umbilicus, likely to represent resolving postoperative scar tissue. No definite abdominal wall abscess is confidently identified at this time, although the presence of persistent infected tissue with draining sinus tracts is not excluded, and clinical correlation with physical exam is recommended. 3. 2 cm left adrenal adenoma is unchanged.  4. Postoperative changes redemonstrated, as above. 5. Persistent prominence of the extrahepatic bile ducts, similar to prior studies. This is favored to be functional postcholecystectomy bile duct dilatation rather than evidence of obstruction, particularly in correlation with prior MRI 06/03/2013 (see that report for further details).   Electronically Signed   By: Vinnie Langton M.D.   On: 06/06/2013 21:38   Mr 3d Recon At Scanner  06/03/2013   CLINICAL DATA:  Biliary tract dilatation.  EXAM: MRI ABDOMEN WITHOUT AND WITH CONTRAST (INCLUDING MRCP)  TECHNIQUE: Multiplanar multisequence MR imaging of the abdomen was performed both before and after the administration of intravenous contrast. Heavily T2-weighted images of the biliary and pancreatic ducts were obtained, and three-dimensional MRCP images were rendered by post processing.  CONTRAST:  35m MULTIHANCE GADOBENATE DIMEGLUMINE 529 MG/ML IV SOLN  COMPARISON:  CT of the abdomen  and pelvis 05/04/2013.  FINDINGS: MRCP images are slightly suboptimal related to gross patient motion. With these limitations in mind, these images demonstrate minimal intrahepatic biliary ductal dilatation. The common hepatic duct measures up to 9 mm. The cystic duct remnant is also slightly prominent measuring up to 9 mm. The common bile duct also measures 9 mm. The distal common bile duct abruptly tapers immediately before the level of the ampulla. No filling defects within the duct  1.8 x 1.5 cm left adrenal nodule demonstrates profound loss of signal intensity throughout the nodule on out of phase dual echo images, compatible with high intracellular lipid in the setting of an adenoma.  Status post cholecystectomy. Mild diffuse loss of signal intensity throughout the hepatic parenchyma on out of phase dual echo images, indicative of very mild hepatic steatosis. No focal hepatic lesions are noted. The appearance of the pancreas, spleen and bilateral kidneys is unremarkable. Specifically, no pancreatic ductal dilatation. Extensive susceptibility artifact noted throughout the upper abdomen in the region of the gastroesophageal junction, right upper retroperitoneum (related to prior right adrenalectomy), and adjacent to the stomach, related to clips from prior surgeries.  IMPRESSION: 1. Very mild intra and extrahepatic biliary ductal dilatation, as above. This appears very similar to the prior CT scan 05/04/2013, although today's study clearly demonstrates that the perceived ductal enlargement in the porta hepatis was actually a combination of the adjacent common hepatic duct and cystic duct remnants rather than one large common bile duct as it appeared to be on the prior CT scan. The common hepatic duct, cystic duct remnant and common bile duct are all mildly dilated measuring up to 9 mm, and there is very mild intrahepatic biliary ductal dilatation. Although much of this could be attributable to functional  dilatation in this post cholecystectomy patient, the common bile duct does abruptly taper immediately before the level of the ampulla such that a distal CBD stricture is not excluded. If there is clinical concern for biliary obstruction, further evaluation with ERCP may be warranted. 2. 1.8 x 1.5 cm left adrenal adenoma. 3. Mild hepatic steatosis. 4. Additional incidental findings, as above.   Electronically Signed   By: DVinnie LangtonM.D.   On: 06/03/2013 10:02   Mr Abd W/wo Cm/mrcp  06/03/2013   CLINICAL DATA:  Biliary tract dilatation.  EXAM: MRI ABDOMEN WITHOUT AND WITH CONTRAST (INCLUDING MRCP)  TECHNIQUE: Multiplanar multisequence MR imaging of the abdomen was performed both before and after the administration of intravenous contrast. Heavily T2-weighted images of the biliary and pancreatic ducts were obtained, and three-dimensional MRCP images were rendered by post processing.  CONTRAST:  159mMULTIHANCE  GADOBENATE DIMEGLUMINE 529 MG/ML IV SOLN  COMPARISON:  CT of the abdomen and pelvis 05/04/2013.  FINDINGS: MRCP images are slightly suboptimal related to gross patient motion. With these limitations in mind, these images demonstrate minimal intrahepatic biliary ductal dilatation. The common hepatic duct measures up to 9 mm. The cystic duct remnant is also slightly prominent measuring up to 9 mm. The common bile duct also measures 9 mm. The distal common bile duct abruptly tapers immediately before the level of the ampulla. No filling defects within the duct  1.8 x 1.5 cm left adrenal nodule demonstrates profound loss of signal intensity throughout the nodule on out of phase dual echo images, compatible with high intracellular lipid in the setting of an adenoma.  Status post cholecystectomy. Mild diffuse loss of signal intensity throughout the hepatic parenchyma on out of phase dual echo images, indicative of very mild hepatic steatosis. No focal hepatic lesions are noted. The appearance of the pancreas,  spleen and bilateral kidneys is unremarkable. Specifically, no pancreatic ductal dilatation. Extensive susceptibility artifact noted throughout the upper abdomen in the region of the gastroesophageal junction, right upper retroperitoneum (related to prior right adrenalectomy), and adjacent to the stomach, related to clips from prior surgeries.  IMPRESSION: 1. Very mild intra and extrahepatic biliary ductal dilatation, as above. This appears very similar to the prior CT scan 05/04/2013, although today's study clearly demonstrates that the perceived ductal enlargement in the porta hepatis was actually a combination of the adjacent common hepatic duct and cystic duct remnants rather than one large common bile duct as it appeared to be on the prior CT scan. The common hepatic duct, cystic duct remnant and common bile duct are all mildly dilated measuring up to 9 mm, and there is very mild intrahepatic biliary ductal dilatation. Although much of this could be attributable to functional dilatation in this post cholecystectomy patient, the common bile duct does abruptly taper immediately before the level of the ampulla such that a distal CBD stricture is not excluded. If there is clinical concern for biliary obstruction, further evaluation with ERCP may be warranted. 2. 1.8 x 1.5 cm left adrenal adenoma. 3. Mild hepatic steatosis. 4. Additional incidental findings, as above.   Electronically Signed   By: Vinnie Langton M.D.   On: 06/03/2013 10:02    CBC  Recent Labs Lab 06/24/13 1410 06/24/13 1744 06/25/13 0745 06/26/13 2145 06/27/13 0632  WBC 3.2* 2.9* 5.8 5.7 5.8  HGB 9.0* 8.9* 6.8* 8.5* 9.3*  HCT 25.1* 25.0* 18.7* 24.4* 26.4*  PLT 214 200 175 138* 155  MCV 86.3 85.0 85.8 86.2 86.3  MCH 30.9 30.3 31.2 30.0 30.4  MCHC 35.9 35.6 36.4* 34.8 35.2  RDW 14.9 14.7 14.7 14.3 14.4  LYMPHSABS 1.5  --   --   --   --   MONOABS 0.4  --   --   --   --   EOSABS 0.0  --   --   --   --   BASOSABS 0.0  --   --    --   --     Chemistries   Recent Labs Lab 06/24/13 1410 06/25/13 0745 06/26/13 2145 06/27/13 0632  NA 141 138 144 142  K 3.9 3.3* 3.4* 3.6*  CL 104 106 116* 110  CO2 17* 19 18* 21  GLUCOSE 104* 85 82 90  BUN 4* <3* <3* <3*  CREATININE 0.55 0.60 0.46* 0.50  CALCIUM 7.8* 6.9* 6.7* 8.5  MG  --  1.3*  --   --  AST 112* 46* 34  --   ALT 40* 25 18  --   ALKPHOS 70 52 53  --   BILITOT 0.4 0.6 <0.2*  --    ------------------------------------------------------------------------------------------------------------------ CrCl is unknown because both a height and weight (above a minimum accepted value) are required for this calculation. ------------------------------------------------------------------------------------------------------------------ No results found for this basename: HGBA1C,  in the last 72 hours ------------------------------------------------------------------------------------------------------------------ No results found for this basename: CHOL, HDL, LDLCALC, TRIG, CHOLHDL, LDLDIRECT,  in the last 72 hours ------------------------------------------------------------------------------------------------------------------ No results found for this basename: TSH, T4TOTAL, FREET3, T3FREE, THYROIDAB,  in the last 72 hours ------------------------------------------------------------------------------------------------------------------ No results found for this basename: VITAMINB12, FOLATE, FERRITIN, TIBC, IRON, RETICCTPCT,  in the last 72 hours  Coagulation profile No results found for this basename: INR, PROTIME,  in the last 168 hours  No results found for this basename: DDIMER,  in the last 72 hours  Cardiac Enzymes No results found for this basename: CK, CKMB, TROPONINI, MYOGLOBIN,  in the last 168 hours ------------------------------------------------------------------------------------------------------------------ No components found with this basename:  POCBNP,     Jashae Wiggs D.O. on 06/27/2013 at 8:46 AM  Between 7am to 7pm - Pager - [phone]  After 7pm go to [email or web address] - password [email or web address]  And look for the night coverage person covering for me after hours  [facility] Office  [phone]

## 2013-06-28 ENCOUNTER — Encounter (HOSPITAL_COMMUNITY): Payer: Self-pay | Admitting: Orthopedic Surgery

## 2013-06-28 LAB — POTASSIUM: Potassium: 3.9 mEq/L (ref 3.7–5.3)

## 2013-06-28 MED ORDER — POLYETHYLENE GLYCOL 3350 17 G PO PACK
17.0000 g | PACK | Freq: Every day | ORAL | Status: DC
  Administered 2013-06-28 – 2013-06-30 (×3): 17 g via ORAL
  Filled 2013-06-28 (×8): qty 1

## 2013-06-28 MED ORDER — DOCUSATE SODIUM 100 MG PO CAPS
100.0000 mg | ORAL_CAPSULE | Freq: Two times a day (BID) | ORAL | Status: DC
Start: 2013-06-28 — End: 2013-07-03
  Administered 2013-06-28 – 2013-07-01 (×8): 100 mg via ORAL
  Filled 2013-06-28 (×10): qty 1

## 2013-06-28 NOTE — Progress Notes (Signed)
[facility]                                                                              Patient Demographics  Angel Price, is a 48 y.o. female, DOB - [birth year], JME:[id number]  Admit date - 06/24/2013   Admitting Physician Theodis Blaze, MD  Outpatient Primary MD for the patient is Doran Heater, MD  LOS - 4   Chief Complaint  Patient presents with  . Suicide Attempt        Assessment & Plan   Suicide attempt by lacerating right wrist -Orthopedics was consulted and conducted exploration of the wound -Psychiatry consulted and recommended inpatient stay at [facility]  -Patient currently denies any suicidal ideations -Attempted due to loss of a son to brain cancer -Continue sitter -Continue Bactrim  -Continue pain management -patient is medically stable -Will consult surgery again for inpatient follow up as patient will be discharged to Behavioral health   Leukopenia  -Resoved, Unclear etiology  Symptomatic Acute blood loss anemia -Secondary to acute injury -Patient currently tachycardic -Hemoglobin 6.8,  2 units packed red blood cells were transfused on 4/10 -hemoglobin 9.3  Transaminitis -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 -AST 46, ALT 25, alk phos 52, total bili 0.6  Hypokalemia  -Resolved, K 3.9 -Magnesium 1.3 and replaced  Hypertension -Hypotension resolved, continue holding lasix  History of lupus -Currently not on any medications  History of hyperthyroidism -On no medications, status post radiation in 2000  Code Status: Full  Family Communication: None at bedside  Disposition Plan: Admitted, patient is medically stable, will contact surgery for further recommendations and plan for discharge 06/29/13 to behavioral health  Time Spent in minutes   25 minutes  Procedures  Exploration of traumatic wound, right wrist.  Consults   Orthopedic surgery Psychiatry  DVT Prophylaxis  SCDs  Lab Results  Component  Value Date   PLT 155 06/27/2013    Medications  Scheduled Meds: . sodium chloride  3 mL Intravenous Q12H  . sulfamethoxazole-trimethoprim  1 tablet Oral Q12H   Continuous Infusions: . sodium chloride 75 mL/hr at 06/26/13 0452   PRN Meds:.diphenhydrAMINE, HYDROcodone-acetaminophen, HYDROmorphone (DILAUDID) injection, ondansetron (ZOFRAN) IV, ondansetron  Antibiotics    Anti-infectives   Start     Dose/Rate Route Frequency Ordered Stop   06/25/13 0600  ceFAZolin (ANCEF) IVPB 2 g/50 mL premix     2 g 100 mL/hr over 30 Minutes Intravenous On call to O.R. 06/24/13 2243 06/26/13 0559   06/24/13 2245  sulfamethoxazole-trimethoprim (BACTRIM DS) 800-160 MG per tablet 1 tablet     1 tablet Oral Every 12 hours 06/24/13 2243 07/01/13 2159   06/24/13 1515  ceFAZolin (ANCEF) injection 1 g     1 g Intramuscular  Once 06/24/13 1507 06/24/13 1523        Subjective:   Angel Price seen and examined today.  Patient states pain in her right arm is improving but she is still requiring pain medication.  She does not feel dizzy.    Objective:   Filed Vitals:   06/27/13 1444 06/27/13 2018 06/27/13 2051 06/28/13 0605  BP: 118/83 142/92 129/93 109/79  Pulse: 81 72 76  72  Temp: 98.3 F (36.8 C) 98 F (36.7 C)  98 F (36.7 C)  TempSrc: Oral     Resp: _0 SpO2: 100% 94%  96%    Wt Readings from Last 3 Encounters:  06/06/13 59.421 kg (131 lb)  05/14/13 63.957 kg (141 lb)  05/12/13 63.957 kg (141 lb)     Intake/Output Summary (Last 24 hours) at 06/28/13 0829 Last data filed at 06/27/13 2200  Gross per 24 hour  Intake   3240 ml  Output      0 ml  Net   3240 ml    Exam  General: Well developed, well nourished, NAD, appears stated age  HEENT: NCAT, PERRLA, EOMI, Anicteic Sclera, mucous membranes moist.   Neck: Supple, no JVD, no masses  Cardiovascular: S1 S2 auscultated, no rubs, murmurs or gallops. Regular rate and rhythm.  Respiratory: Clear to auscultation  bilaterally with equal chest rise  Abdomen: Soft, nontender, nondistended, + bowel sounds  Extremities: warm dry without cyanosis clubbing or edema. Right upper extremity currently wrapped, right hand edema  Neuro: AAOx3, some decreased sensation in the right hand, however no other focal deficits   Skin: Without rashes exudates or nodules, multiple tattoos   Psych: Normal affect and demeanor  Data Review   Micro Results No results found for this or any previous visit (from the past 240 hour(s)).  Radiology Reports Ct Abdomen Pelvis W Contrast  06/06/2013   CLINICAL DATA:  Abdominal pain.  EXAM: CT ABDOMEN AND PELVIS WITH CONTRAST  TECHNIQUE: Multidetector CT imaging of the abdomen and pelvis was performed using the standard protocol following bolus administration of intravenous contrast.  CONTRAST:  116m OMNIPAQUE IOHEXOL 300 MG/ML  SOLN  COMPARISON:  CT of the abdomen and pelvis 05/04/2013.  FINDINGS: Lung Bases: Unremarkable.  Abdomen/Pelvis: Status post cholecystectomy, gastric bypass procedure and hysterectomy. Numerous surgical clips are noted superior and medial to the right kidney, potentially from prior right adrenalectomy. 2 cm left adrenal nodule is incompletely characterized, but unchanged compared to prior examination dating back to 04/18/2013 (and recently characterized as an adenoma on MRI 06/03/2013). Common bile duct appears slightly dilated measuring 9 mm in the porta hepatis, however, this is stable compared to prior studies. No significant intrahepatic biliary ductal dilatation. The appearance of the liver, pancreas, spleen and bilateral kidneys is unremarkable.  No significant volume of ascites. No pneumoperitoneum. No pathologic distention of small bowel. No definite lymphadenopathy identified within the abdomen or pelvis. Atherosclerosis throughout the abdominal and pelvic vasculature, without evidence of aneurysm. Ovaries are not confidently identified may be surgically  absent or atrophic. Urinary bladder is unremarkable in appearance.  As with prior examinations, there is some amorphous soft tissue density in the anterior abdominal wall around the umbilicus, similar to prior study from 05/04/2013 which is favored to represent sequela of prior wound infection with anterior abdominal wall abscesses or fistulous tracts. Given the stability in size, persistent intra abdominal wall abscesses are unlikely, and this is favored to represent chronic postoperative scar tissue at this time.  Musculoskeletal: There are no aggressive appearing lytic or blastic lesions noted in the visualized portions of the skeleton.  IMPRESSION: 1. No acute findings in the abdomen or pelvis to account for the patient's symptoms. 2. Persistent prominence of abnormal soft tissue in the anterior abdominal wall around the umbilicus, likely to represent resolving postoperative scar tissue. No definite abdominal wall abscess is confidently identified at this time, although the presence of persistent  infected tissue with draining sinus tracts is not excluded, and clinical correlation with physical exam is recommended. 3. 2 cm left adrenal adenoma is unchanged. 4. Postoperative changes redemonstrated, as above. 5. Persistent prominence of the extrahepatic bile ducts, similar to prior studies. This is favored to be functional postcholecystectomy bile duct dilatation rather than evidence of obstruction, particularly in correlation with prior MRI 06/03/2013 (see that report for further details).   Electronically Signed   By: Vinnie Langton M.D.   On: 06/06/2013 21:38   Mr 3d Recon At Scanner  06/03/2013   CLINICAL DATA:  Biliary tract dilatation.  EXAM: MRI ABDOMEN WITHOUT AND WITH CONTRAST (INCLUDING MRCP)  TECHNIQUE: Multiplanar multisequence MR imaging of the abdomen was performed both before and after the administration of intravenous contrast. Heavily T2-weighted images of the biliary and pancreatic ducts were  obtained, and three-dimensional MRCP images were rendered by post processing.  CONTRAST:  33m MULTIHANCE GADOBENATE DIMEGLUMINE 529 MG/ML IV SOLN  COMPARISON:  CT of the abdomen and pelvis 05/04/2013.  FINDINGS: MRCP images are slightly suboptimal related to gross patient motion. With these limitations in mind, these images demonstrate minimal intrahepatic biliary ductal dilatation. The common hepatic duct measures up to 9 mm. The cystic duct remnant is also slightly prominent measuring up to 9 mm. The common bile duct also measures 9 mm. The distal common bile duct abruptly tapers immediately before the level of the ampulla. No filling defects within the duct  1.8 x 1.5 cm left adrenal nodule demonstrates profound loss of signal intensity throughout the nodule on out of phase dual echo images, compatible with high intracellular lipid in the setting of an adenoma.  Status post cholecystectomy. Mild diffuse loss of signal intensity throughout the hepatic parenchyma on out of phase dual echo images, indicative of very mild hepatic steatosis. No focal hepatic lesions are noted. The appearance of the pancreas, spleen and bilateral kidneys is unremarkable. Specifically, no pancreatic ductal dilatation. Extensive susceptibility artifact noted throughout the upper abdomen in the region of the gastroesophageal junction, right upper retroperitoneum (related to prior right adrenalectomy), and adjacent to the stomach, related to clips from prior surgeries.  IMPRESSION: 1. Very mild intra and extrahepatic biliary ductal dilatation, as above. This appears very similar to the prior CT scan 05/04/2013, although today's study clearly demonstrates that the perceived ductal enlargement in the porta hepatis was actually a combination of the adjacent common hepatic duct and cystic duct remnants rather than one large common bile duct as it appeared to be on the prior CT scan. The common hepatic duct, cystic duct remnant and common bile  duct are all mildly dilated measuring up to 9 mm, and there is very mild intrahepatic biliary ductal dilatation. Although much of this could be attributable to functional dilatation in this post cholecystectomy patient, the common bile duct does abruptly taper immediately before the level of the ampulla such that a distal CBD stricture is not excluded. If there is clinical concern for biliary obstruction, further evaluation with ERCP may be warranted. 2. 1.8 x 1.5 cm left adrenal adenoma. 3. Mild hepatic steatosis. 4. Additional incidental findings, as above.   Electronically Signed   By: DVinnie LangtonM.D.   On: 06/03/2013 10:02   Mr Abd W/wo Cm/mrcp  06/03/2013   CLINICAL DATA:  Biliary tract dilatation.  EXAM: MRI ABDOMEN WITHOUT AND WITH CONTRAST (INCLUDING MRCP)  TECHNIQUE: Multiplanar multisequence MR imaging of the abdomen was performed both before and after the administration of intravenous contrast.  Heavily T2-weighted images of the biliary and pancreatic ducts were obtained, and three-dimensional MRCP images were rendered by post processing.  CONTRAST:  59m MULTIHANCE GADOBENATE DIMEGLUMINE 529 MG/ML IV SOLN  COMPARISON:  CT of the abdomen and pelvis 05/04/2013.  FINDINGS: MRCP images are slightly suboptimal related to gross patient motion. With these limitations in mind, these images demonstrate minimal intrahepatic biliary ductal dilatation. The common hepatic duct measures up to 9 mm. The cystic duct remnant is also slightly prominent measuring up to 9 mm. The common bile duct also measures 9 mm. The distal common bile duct abruptly tapers immediately before the level of the ampulla. No filling defects within the duct  1.8 x 1.5 cm left adrenal nodule demonstrates profound loss of signal intensity throughout the nodule on out of phase dual echo images, compatible with high intracellular lipid in the setting of an adenoma.  Status post cholecystectomy. Mild diffuse loss of signal intensity  throughout the hepatic parenchyma on out of phase dual echo images, indicative of very mild hepatic steatosis. No focal hepatic lesions are noted. The appearance of the pancreas, spleen and bilateral kidneys is unremarkable. Specifically, no pancreatic ductal dilatation. Extensive susceptibility artifact noted throughout the upper abdomen in the region of the gastroesophageal junction, right upper retroperitoneum (related to prior right adrenalectomy), and adjacent to the stomach, related to clips from prior surgeries.  IMPRESSION: 1. Very mild intra and extrahepatic biliary ductal dilatation, as above. This appears very similar to the prior CT scan 05/04/2013, although today's study clearly demonstrates that the perceived ductal enlargement in the porta hepatis was actually a combination of the adjacent common hepatic duct and cystic duct remnants rather than one large common bile duct as it appeared to be on the prior CT scan. The common hepatic duct, cystic duct remnant and common bile duct are all mildly dilated measuring up to 9 mm, and there is very mild intrahepatic biliary ductal dilatation. Although much of this could be attributable to functional dilatation in this post cholecystectomy patient, the common bile duct does abruptly taper immediately before the level of the ampulla such that a distal CBD stricture is not excluded. If there is clinical concern for biliary obstruction, further evaluation with ERCP may be warranted. 2. 1.8 x 1.5 cm left adrenal adenoma. 3. Mild hepatic steatosis. 4. Additional incidental findings, as above.   Electronically Signed   By: DVinnie LangtonM.D.   On: 06/03/2013 10:02    CBC  Recent Labs Lab 06/24/13 1410 06/24/13 1744 06/25/13 0745 06/26/13 2145 06/27/13 0632  WBC 3.2* 2.9* 5.8 5.7 5.8  HGB 9.0* 8.9* 6.8* 8.5* 9.3*  HCT 25.1* 25.0* 18.7* 24.4* 26.4*  PLT 214 200 175 138* 155  MCV 86.3 85.0 85.8 86.2 86.3  MCH 30.9 30.3 31.2 30.0 30.4  MCHC 35.9  35.6 36.4* 34.8 35.2  RDW 14.9 14.7 14.7 14.3 14.4  LYMPHSABS 1.5  --   --   --   --   MONOABS 0.4  --   --   --   --   EOSABS 0.0  --   --   --   --   BASOSABS 0.0  --   --   --   --     Chemistries   Recent Labs Lab 06/24/13 1410 06/25/13 0745 06/26/13 2145 06/27/13 0632  NA 141 138 144 142  K 3.9 3.3* 3.4* 3.6*  CL 104 106 116* 110  CO2 17* 19 18* 21  GLUCOSE 104* 85 82  90  BUN 4* <3* <3* <3*  CREATININE 0.55 0.60 0.46* 0.50  CALCIUM 7.8* 6.9* 6.7* 8.5  MG  --  1.3*  --   --   AST 112* 46* 34  --   ALT 40* 25 18  --   ALKPHOS 70 52 53  --   BILITOT 0.4 0.6 <0.2*  --    ------------------------------------------------------------------------------------------------------------------ CrCl is unknown because both a height and weight (above a minimum accepted value) are required for this calculation. ------------------------------------------------------------------------------------------------------------------ No results found for this basename: HGBA1C,  in the last 72 hours ------------------------------------------------------------------------------------------------------------------ No results found for this basename: CHOL, HDL, LDLCALC, TRIG, CHOLHDL, LDLDIRECT,  in the last 72 hours ------------------------------------------------------------------------------------------------------------------ No results found for this basename: TSH, T4TOTAL, FREET3, T3FREE, THYROIDAB,  in the last 72 hours ------------------------------------------------------------------------------------------------------------------ No results found for this basename: VITAMINB12, FOLATE, FERRITIN, TIBC, IRON, RETICCTPCT,  in the last 72 hours  Coagulation profile No results found for this basename: INR, PROTIME,  in the last 168 hours  No results found for this basename: DDIMER,  in the last 72 hours  Cardiac Enzymes No results found for this basename: CK, CKMB, TROPONINI, MYOGLOBIN,  in  the last 168 hours ------------------------------------------------------------------------------------------------------------------ No components found with this basename: POCBNP,     Josiah Nieto D.O. on 06/28/2013 at 8:29 AM  Between 7am to 7pm - Pager - [phone]  After 7pm go to [email or web address] - password [email or web address]  And look for the night coverage person covering for me after hours  [facility] Office  [phone]

## 2013-06-29 MED ORDER — SULFAMETHOXAZOLE-TMP DS 800-160 MG PO TABS: 1.0000 | ORAL_TABLET | Freq: Two times a day (BID) | ORAL | Status: AC

## 2013-06-29 MED ORDER — BENZONATATE 100 MG PO CAPS: 100.0000 mg | ORAL_CAPSULE | Freq: Two times a day (BID) | ORAL | Status: DC | PRN

## 2013-06-29 MED ORDER — FLUTICASONE PROPIONATE 50 MCG/ACT NA SUSP: 1.0000 | Freq: Every day | NASAL | Status: DC

## 2013-06-29 MED ORDER — BENZONATATE 100 MG PO CAPS
100.0000 mg | ORAL_CAPSULE | Freq: Two times a day (BID) | ORAL | Status: DC | PRN
  Filled 2013-06-29: qty 1

## 2013-06-29 MED ORDER — FLUTICASONE PROPIONATE 50 MCG/ACT NA SUSP
1.0000 | Freq: Every day | NASAL | Status: DC
  Administered 2013-06-29 – 2013-07-01 (×3): 1 via NASAL
  Filled 2013-06-29: qty 16

## 2013-06-29 MED ORDER — HYDROCODONE-ACETAMINOPHEN 5-325 MG PO TABS: 1.0000 | ORAL_TABLET | Freq: Four times a day (QID) | ORAL | Status: DC | PRN

## 2013-06-29 NOTE — Discharge Summary (Signed)
Physician Discharge Summary  Angel Price PNT:[id number] DOB: August 29, 1965 DOA: 06/24/2013  PCP: Doran Heater, MD  Admit date: 06/24/2013 Discharge date: 06/29/2013  Time spent: 45 minutes  Recommendations for Outpatient Follow-up:  Patient will be discharged to behavioral health. She should follow up with her primary care physician once discharged from there. She should also follow up with Dr. Fredna Dow.  She should continue her medications as prescribed.  Discharge Diagnoses:  Suicide attempt by lacerating right wrist Leukopenia Symptomatic acute blood loss anemia Transaminitis Hypokalemia Hypertension History of lupus History of hyperthyroidism  Discharge Condition: Stable  Diet recommendation: Regular  History of present illness:  Pt is 48 yo female who was brought to [facility] ED via EMS after found mother found her in the pool of blood from what appeared to be secondary to slicing the volar aspect of the right wrist. Pt unable to provide history due to somewhat altered mental status, per EMS report, copious amount of blood noted at the scene. Pt apparently attempted suicide and apparently diagnosed with depression 6 months ago after death of her child. She is currently reporting pain in the right wrist area, numbness and loss of sensation, unable to flex her wrist.  In ED, pt noted to be somnolent on arrival, Hg drop noted since March 22nd, 2015 (Hg was 15), Hg = 9. Hand surgery specialist consulted and [facility] asked to admit for further evaluation.    Hospital Course:  Suicide attempt by lacerating right wrist  -Orthopedics was consulted and conducted exploration of the wound  -Psychiatry consulted and recommended inpatient stay at [place]  -Patient currently denies any suicidal ideations  -Attempted due to loss of a son to brain cancer  -Continue sitter  -Continue Bactrim  -Continue pain management  -patient is medically stable  -Patient will be seen by Dr. 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  Consultations: Orthopedic surgery  Psychiatry  Discharge Exam: Filed Vitals:   06/29/13 0457  BP: 99/64  Pulse: 78  Temp: 97.8 F (36.6 C)  Resp: 18   Exam  General: Well developed, well nourished, NAD, appears stated age  HEENT: NCAT, PERRLA, EOMI, Anicteic Sclera, mucous membranes moist.  Neck: Supple, no JVD, no masses  Cardiovascular: S1 S2 auscultated, no rubs, murmurs or gallops. Regular rate and rhythm.  Respiratory: Clear to auscultation bilaterally with equal chest rise  Abdomen: Soft, nontender, nondistended, + bowel sounds  Extremities: warm dry without cyanosis clubbing or edema. Right upper extremity currently wrapped, right hand edema  Neuro: AAOx3, some decreased sensation in the right hand, however no other focal deficits  Skin: Without rashes exudates or nodules, multiple tattoos  Psych: Normal affect and demeanor  Discharge Instructions      Discharge Orders   Future  Orders Complete By Expires   Discharge instructions  As directed    Increase activity slowly  As directed        Medication List    STOP taking these medications       lisinopril 10 MG tablet  Commonly known as:  PRINIVIL        TAKE these medications       benzonatate 100 MG capsule  Commonly known as:  TESSALON  Take 1 capsule (100 mg total) by mouth 2 (two) times daily as needed for cough.     fluticasone 50 MCG/ACT nasal spray  Commonly known as:  FLONASE  Place 1 spray into both nostrils daily.     HYDROcodone-acetaminophen 5-325 MG per tablet  Commonly known as:  NORCO/VICODIN  Take 1 tablet by mouth every 6 (six) hours as needed for moderate pain.     ondansetron 4 MG disintegrating tablet  Commonly known as:  ZOFRAN-ODT  Take 4 mg by mouth every 8 (eight) hours as needed for nausea or vomiting. 51m ODT q4 hours prn nausea/vomit     sulfamethoxazole-trimethoprim 800-160 MG per tablet  Commonly known as:  BACTRIM DS  Take 1 tablet by mouth every 12 (twelve) hours.       Allergies  Allergen Reactions  . Codeine Hives  . Morphine And Related     gastritis   Follow-up Information   Follow up with KTennis Must MD In 1 week.   Specialty:  Orthopedic Surgery   Contact information:   [id number] [phone]      Schedule an appointment as soon as possible for a visit with WDoran Heater MD. (When discharged from behavioral health)    Specialty:  Family Medicine       The results of significant diagnostics from this hospitalization (including imaging, microbiology, ancillary and laboratory) are listed below for reference.    Significant Diagnostic Studies: Ct Abdomen Pelvis W Contrast  06/06/2013   CLINICAL DATA:  Abdominal pain.  EXAM: CT ABDOMEN AND PELVIS WITH CONTRAST  TECHNIQUE: Multidetector CT imaging of the abdomen and pelvis was performed using the standard protocol following bolus administration of intravenous contrast.  CONTRAST:  1069mOMNIPAQUE IOHEXOL 300 MG/ML  SOLN  COMPARISON:  CT of the abdomen and pelvis 05/04/2013.  FINDINGS: Lung Bases: Unremarkable.  Abdomen/Pelvis: Status post cholecystectomy, gastric bypass procedure and hysterectomy.  Numerous surgical clips are noted superior and medial to the right kidney, potentially from prior right adrenalectomy. 2 cm left adrenal nodule is incompletely characterized, but unchanged compared to prior examination dating back to 04/18/2013 (and recently characterized as an adenoma on MRI 06/03/2013). Common bile duct appears slightly dilated measuring 9 mm in the porta hepatis, however, this is stable compared to prior studies. No significant intrahepatic biliary ductal dilatation. The appearance of the liver, pancreas, spleen and bilateral kidneys is unremarkable.  No significant volume of ascites. No pneumoperitoneum. No pathologic distention of small bowel. No definite lymphadenopathy identified within the abdomen or pelvis. Atherosclerosis throughout the abdominal and pelvic vasculature, without evidence of aneurysm. Ovaries are not confidently identified may be surgically absent or atrophic. Urinary bladder is unremarkable in appearance.  As with prior examinations, there is some amorphous soft tissue density in the anterior abdominal wall around the umbilicus, similar to prior study from 05/04/2013 which is favored to represent sequela of prior wound infection with anterior abdominal wall abscesses or fistulous tracts. Given the stability in size, persistent intra abdominal wall  abscesses are unlikely, and this is favored to represent chronic postoperative scar tissue at this time.  Musculoskeletal: There are no aggressive appearing lytic or blastic lesions noted in the visualized portions of the skeleton.  IMPRESSION: 1. No acute findings in the abdomen or pelvis to account for the patient's symptoms. 2. Persistent prominence of abnormal soft tissue in the anterior abdominal wall around the umbilicus, likely to represent resolving postoperative scar tissue. No definite abdominal wall abscess is confidently identified at this time, although the presence of persistent infected tissue with draining sinus  tracts is not excluded, and clinical correlation with physical exam is recommended. 3. 2 cm left adrenal adenoma is unchanged. 4. Postoperative changes redemonstrated, as above. 5. Persistent prominence of the extrahepatic bile ducts, similar to prior studies. This is favored to be functional postcholecystectomy bile duct dilatation rather than evidence of obstruction, particularly in correlation with prior MRI 06/03/2013 (see that report for further details).   Electronically Signed   By: Vinnie Langton M.D.   On: 06/06/2013 21:38   Mr 3d Recon At Scanner  06/03/2013   CLINICAL DATA:  Biliary tract dilatation.  EXAM: MRI ABDOMEN WITHOUT AND WITH CONTRAST (INCLUDING MRCP)  TECHNIQUE: Multiplanar multisequence MR imaging of the abdomen was performed both before and after the administration of intravenous contrast. Heavily T2-weighted images of the biliary and pancreatic ducts were obtained, and three-dimensional MRCP images were rendered by post processing.  CONTRAST:  55m MULTIHANCE GADOBENATE DIMEGLUMINE 529 MG/ML IV SOLN  COMPARISON:  CT of the abdomen and pelvis 05/04/2013.  FINDINGS: MRCP images are slightly suboptimal related to gross patient motion. With these limitations in mind, these images demonstrate minimal intrahepatic biliary ductal dilatation. The common hepatic duct measures up to 9 mm. The cystic duct remnant is also slightly prominent measuring up to 9 mm. The common bile duct also measures 9 mm. The distal common bile duct abruptly tapers immediately before the level of the ampulla. No filling defects within the duct  1.8 x 1.5 cm left adrenal nodule demonstrates profound loss of signal intensity throughout the nodule on out of phase dual echo images, compatible with high intracellular lipid in the setting of an adenoma.  Status post cholecystectomy. Mild diffuse loss of signal intensity throughout the hepatic parenchyma on out of phase dual echo images, indicative of very mild hepatic  steatosis. No focal hepatic lesions are noted. The appearance of the pancreas, spleen and bilateral kidneys is unremarkable. Specifically, no pancreatic ductal dilatation. Extensive susceptibility artifact noted throughout the upper abdomen in the region of the gastroesophageal junction, right upper retroperitoneum (related to prior right adrenalectomy), and adjacent to the stomach, related to clips from prior surgeries.  IMPRESSION: 1. Very mild intra and extrahepatic biliary ductal dilatation, as above. This appears very similar to the prior CT scan 05/04/2013, although today's study clearly demonstrates that the perceived ductal enlargement in the porta hepatis was actually a combination of the adjacent common hepatic duct and cystic duct remnants rather than one large common bile duct as it appeared to be on the prior CT scan. The common hepatic duct, cystic duct remnant and common bile duct are all mildly dilated measuring up to 9 mm, and there is very mild intrahepatic biliary ductal dilatation. Although much of this could be attributable to functional dilatation in this post cholecystectomy patient, the common bile duct does abruptly taper immediately before the level of the ampulla such that a distal CBD stricture is not excluded. If there is clinical concern  for biliary obstruction, further evaluation with ERCP may be warranted. 2. 1.8 x 1.5 cm left adrenal adenoma. 3. Mild hepatic steatosis. 4. Additional incidental findings, as above.   Electronically Signed   By: Vinnie Langton M.D.   On: 06/03/2013 10:02   Mr Abd W/wo Cm/mrcp  06/03/2013   CLINICAL DATA:  Biliary tract dilatation.  EXAM: MRI ABDOMEN WITHOUT AND WITH CONTRAST (INCLUDING MRCP)  TECHNIQUE: Multiplanar multisequence MR imaging of the abdomen was performed both before and after the administration of intravenous contrast. Heavily T2-weighted images of the biliary and pancreatic ducts were obtained, and three-dimensional MRCP images were  rendered by post processing.  CONTRAST:  34m MULTIHANCE GADOBENATE DIMEGLUMINE 529 MG/ML IV SOLN  COMPARISON:  CT of the abdomen and pelvis 05/04/2013.  FINDINGS: MRCP images are slightly suboptimal related to gross patient motion. With these limitations in mind, these images demonstrate minimal intrahepatic biliary ductal dilatation. The common hepatic duct measures up to 9 mm. The cystic duct remnant is also slightly prominent measuring up to 9 mm. The common bile duct also measures 9 mm. The distal common bile duct abruptly tapers immediately before the level of the ampulla. No filling defects within the duct  1.8 x 1.5 cm left adrenal nodule demonstrates profound loss of signal intensity throughout the nodule on out of phase dual echo images, compatible with high intracellular lipid in the setting of an adenoma.  Status post cholecystectomy. Mild diffuse loss of signal intensity throughout the hepatic parenchyma on out of phase dual echo images, indicative of very mild hepatic steatosis. No focal hepatic lesions are noted. The appearance of the pancreas, spleen and bilateral kidneys is unremarkable. Specifically, no pancreatic ductal dilatation. Extensive susceptibility artifact noted throughout the upper abdomen in the region of the gastroesophageal junction, right upper retroperitoneum (related to prior right adrenalectomy), and adjacent to the stomach, related to clips from prior surgeries.  IMPRESSION: 1. Very mild intra and extrahepatic biliary ductal dilatation, as above. This appears very similar to the prior CT scan 05/04/2013, although today's study clearly demonstrates that the perceived ductal enlargement in the porta hepatis was actually a combination of the adjacent common hepatic duct and cystic duct remnants rather than one large common bile duct as it appeared to be on the prior CT scan. The common hepatic duct, cystic duct remnant and common bile duct are all mildly dilated measuring up to 9  mm, and there is very mild intrahepatic biliary ductal dilatation. Although much of this could be attributable to functional dilatation in this post cholecystectomy patient, the common bile duct does abruptly taper immediately before the level of the ampulla such that a distal CBD stricture is not excluded. If there is clinical concern for biliary obstruction, further evaluation with ERCP may be warranted. 2. 1.8 x 1.5 cm left adrenal adenoma. 3. Mild hepatic steatosis. 4. Additional incidental findings, as above.   Electronically Signed   By: DVinnie LangtonM.D.   On: 06/03/2013 10:02    Microbiology: No results found for this or any previous visit (from the past 240 hour(s)).   Labs: Basic Metabolic Panel:  Recent Labs Lab 06/24/13 1410 06/25/13 0745 06/26/13 2145 06/27/13 0632 06/28/13 0846  NA 141 138 144 142  --   K 3.9 3.3* 3.4* 3.6* 3.9  CL 104 106 116* 110  --   CO2 17* 19 18* 21  --   GLUCOSE 104* 85 82 90  --   BUN 4* <3* <3* <3*  --  CREATININE 0.55 0.60 0.46* 0.50  --   CALCIUM 7.8* 6.9* 6.7* 8.5  --   MG  --  1.3*  --   --   --    Liver Function Tests:  Recent Labs Lab 06/24/13 1410 06/25/13 0745 06/26/13 2145  AST 112* 46* 34  ALT 40* 25 18  ALKPHOS 70 52 53  BILITOT 0.4 0.6 <0.2*  PROT 5.9* 5.0* 4.3*  ALBUMIN 2.8* 2.6* 2.0*   No results found for this basename: LIPASE, AMYLASE,  in the last 168 hours No results found for this basename: AMMONIA,  in the last 168 hours CBC:  Recent Labs Lab 06/24/13 1410 06/24/13 1744 06/25/13 0745 06/26/13 2145 06/27/13 0632  WBC 3.2* 2.9* 5.8 5.7 5.8  NEUTROABS 1.2*  --   --   --   --   HGB 9.0* 8.9* 6.8* 8.5* 9.3*  HCT 25.1* 25.0* 18.7* 24.4* 26.4*  MCV 86.3 85.0 85.8 86.2 86.3  PLT 214 200 175 138* 155   Cardiac Enzymes: No results found for this basename: CKTOTAL, CKMB, CKMBINDEX, TROPONINI,  in the last 168 hours BNP: BNP (last 3 results) No results found for this basename: PROBNP,  in the last 8760  hours CBG: No results found for this basename: GLUCAP,  in the last 168 hours     Signed:  Cristal Ford  [facility] 06/29/2013, 11:03 AM

## 2013-06-29 NOTE — Clinical Social Work Psych Note (Signed)
Psych CSW contacted Charge, Otila Kluver at [facility] who states that there are currently no beds available at [facility].  Pt has been faxed to [facility]- no beds available and [facility]- currently under review, no beds available.  Tina at [facility] will f/u re: possible admission.  Nonnie Done, [place] [phone]  Clinical Social Work

## 2013-06-30 ENCOUNTER — Inpatient Hospital Stay (HOSPITAL_COMMUNITY): Admission: EM | Admit: 2013-06-30 | Payer: Medicare Other | Source: Intra-hospital | Admitting: Psychiatry

## 2013-06-30 DIAGNOSIS — F319 Bipolar disorder, unspecified: Secondary | ICD-10-CM | POA: Diagnosis not present

## 2013-06-30 DIAGNOSIS — M329 Systemic lupus erythematosus, unspecified: Secondary | ICD-10-CM | POA: Diagnosis not present

## 2013-06-30 DIAGNOSIS — S61509A Unspecified open wound of unspecified wrist, initial encounter: Secondary | ICD-10-CM | POA: Diagnosis not present

## 2013-06-30 DIAGNOSIS — K5900 Constipation, unspecified: Secondary | ICD-10-CM | POA: Diagnosis not present

## 2013-06-30 MED ORDER — LACTULOSE 10 GM/15ML PO SOLN
20.0000 g | Freq: Two times a day (BID) | ORAL | Status: DC | PRN
  Filled 2013-06-30: qty 30

## 2013-06-30 NOTE — Clinical Social Work Psych Note (Signed)
Pt remains on the [facility] waitlist.  Per Otila Kluver at [facility], no beds available today. No beds available at [facility] or [facility].  Pt remains agreeable to inpatient psychiatric treatment.  Psych CSW evaluated pt.  Full assessment to follow.  Nonnie Done, [place] [phone]  Clinical Social Work

## 2013-06-30 NOTE — Progress Notes (Signed)
Spoke with Maudie Mercury from [employer].  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  Contacted Jody, SW and informed her of the decision.  Pt has been removed from [facility] wait list.

## 2013-06-30 NOTE — ED Notes (Signed)
Spoke with Emeline General at [facility] re:pt's admission.  After discussing pt's MRSA infection with Infection Control RN, it has been determined that pt will not be able to be accepted at [facility].  Will inform Psych CSW re: decision.

## 2013-06-30 NOTE — Progress Notes (Signed)
[facility]                                                                              Patient Demographics  Angel Price, is a 48 y.o. female, DOB - [birth year], XQJ:[id number]  Admit date - 06/24/2013   Admitting Physician Theodis Blaze, MD  Outpatient Primary MD for the patient is Doran Heater, MD  LOS - 6   Chief Complaint  Patient presents with  . 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  Consults   Orthopedic surgery Psychiatry  DVT Prophylaxis  SCDs  Lab Results  Component Value Date   PLT 155 06/27/2013    Medications  Scheduled Meds: . docusate sodium  100 mg Oral BID  . fluticasone  1 spray Each Nare Daily  . polyethylene glycol  17 g Oral Daily  . sodium chloride  3 mL Intravenous Q12H  . sulfamethoxazole-trimethoprim  1 tablet Oral Q12H   Continuous Infusions: . sodium chloride 75 mL/hr at 06/26/13 0452   PRN Meds:.benzonatate, diphenhydrAMINE, HYDROcodone-acetaminophen, HYDROmorphone (DILAUDID) injection, ondansetron (ZOFRAN) IV, ondansetron  Antibiotics    Anti-infectives   Start     Dose/Rate Route Frequency Ordered Stop   06/29/13 0000  sulfamethoxazole-trimethoprim  (BACTRIM DS) 800-160 MG per tablet     1 tablet Oral Every 12 hours 06/29/13 0732 07/01/13 2359   06/25/13 0600  ceFAZolin (ANCEF) IVPB 2 g/50 mL premix     2 g 100 mL/hr over 30 Minutes Intravenous On call to O.R. 06/24/13 2243 06/26/13 0559   06/24/13 2245  sulfamethoxazole-trimethoprim (BACTRIM DS) 800-160 MG per tablet 1 tablet     1 tablet Oral Every 12 hours 06/24/13 2243 07/01/13 2159   06/24/13 1515  ceFAZolin (ANCEF) injection 1 g     1 g Intramuscular  Once 06/24/13 1507 06/24/13 1523        Subjective:   Wants to know when she will be d/c'd  Objective:   Filed Vitals:   06/29/13 0457 06/29/13 1300 06/29/13 2257 06/30/13 0702  BP: 99/64 124/81 114/87 110/84  Pulse: 78 78 78 75  Temp: 97.8 F (36.6 C) 98 F (36.7 C) 98.7 F (37.1 C) 98.1 F (36.7 C)  TempSrc: Oral  Oral Oral  Resp: _0 SpO2: 99% 98% 98% 100%    Wt Readings from Last 3 Encounters:  06/06/13 59.421 kg (131 lb)  05/14/13 63.957 kg (141 lb)  05/12/13 63.957 kg (141 lb)     Intake/Output Summary (Last 24 hours) at 06/30/13 1228 Last data filed at 06/30/13 0703  Gross per 24 hour  Intake    360 ml  Output      0 ml  Net    360 ml    Exam  General: Well developed, well nourished, NAD, appears stated age  Cardiovascular: S1 S2 auscultated, no rubs, murmurs or gallops. Regular rate and rhythm.  Respiratory: Clear to auscultation bilaterally with equal chest rise  Abdomen: Soft, nontender, nondistended, + bowel sounds  Extremities: warm dry without cyanosis clubbing or edema. Right upper extremity currently wrapped, right hand edema  Neuro: AAOx3, some decreased sensation in the right hand, however no other focal deficits   Skin: Without rashes exudates or nodules, multiple tattoos   Psych: Normal affect and demeanor  Data Review   Micro Results No results found for this or any previous visit (from the past 240 hour(s)).  Radiology Reports Ct Abdomen Pelvis W  Contrast  06/06/2013   CLINICAL DATA:  Abdominal pain.  EXAM: CT ABDOMEN AND PELVIS WITH CONTRAST  TECHNIQUE: Multidetector CT imaging of the abdomen and pelvis was performed using the standard protocol following bolus administration of intravenous contrast.  CONTRAST:  100mL OMNIPAQUE IOHEXOL 300 MG/ML  SOLN  COMPARISON:  CT of the abdomen and pelvis 05/04/2013.  FINDINGS: Lung Bases: Unremarkable.  Abdomen/Pelvis: Status post cholecystectomy, gastric bypass procedure and hysterectomy. Numerous surgical clips are noted superior and medial to the right kidney, potentially from prior right adrenalectomy. 2 cm left adrenal nodule is incompletely characterized, but unchanged compared to prior examination dating back to 04/18/2013 (and recently characterized as an adenoma on MRI 06/03/2013). Common bile duct appears slightly dilated measuring 9 mm in the porta hepatis, however, this is stable compared to prior studies. No significant intrahepatic biliary ductal dilatation. The appearance of the liver, pancreas, spleen and bilateral kidneys is unremarkable.  No significant volume of ascites. No pneumoperitoneum. No pathologic distention of small bowel. No definite lymphadenopathy identified within the abdomen or pelvis. Atherosclerosis throughout the abdominal and pelvic vasculature, without evidence of aneurysm. Ovaries are not confidently identified may be surgically absent or atrophic. Urinary bladder is unremarkable in appearance.  As with prior examinations, there is some amorphous soft tissue density in the anterior abdominal wall around the umbilicus, similar to prior study from 05/04/2013 which is favored to represent sequela of prior wound infection with anterior abdominal wall abscesses or fistulous tracts. Given the stability in size, persistent intra abdominal wall abscesses are unlikely, and this is favored to represent chronic postoperative scar tissue at this time.  Musculoskeletal: There are no  aggressive appearing lytic or blastic lesions noted in the visualized portions of the skeleton.  IMPRESSION: 1. No acute findings in the abdomen or pelvis to account for the patient's symptoms. 2. Persistent prominence of abnormal soft tissue in the anterior abdominal wall around the umbilicus, likely to represent resolving postoperative scar tissue. No definite abdominal wall abscess is confidently identified at this time, although the presence of persistent infected tissue with draining sinus tracts is not excluded, and clinical correlation with physical exam is recommended. 3. 2 cm left adrenal adenoma is unchanged. 4. Postoperative changes redemonstrated, as above. 5. Persistent prominence of the extrahepatic bile ducts, similar to prior studies. This is favored to be functional postcholecystectomy bile duct dilatation rather than evidence of obstruction, particularly in correlation with prior MRI 06/03/2013 (see that report for further details).     Electronically Signed   By: Daniel  Entrikin M.D.   On: 06/06/2013 21:38   Mr 3d Recon At Scanner  06/03/2013   CLINICAL DATA:  Biliary tract dilatation.  EXAM: MRI ABDOMEN WITHOUT AND WITH CONTRAST (INCLUDING MRCP)  TECHNIQUE: Multiplanar multisequence MR imaging of the abdomen was performed both before and after the administration of intravenous contrast. Heavily T2-weighted images of the biliary and pancreatic ducts were obtained, and three-dimensional MRCP images were rendered by post processing.  CONTRAST:  13mL MULTIHANCE GADOBENATE DIMEGLUMINE 529 MG/ML IV SOLN  COMPARISON:  CT of the abdomen and pelvis 05/04/2013.  FINDINGS: MRCP images are slightly suboptimal related to gross patient motion. With these limitations in mind, these images demonstrate minimal intrahepatic biliary ductal dilatation. The common hepatic duct measures up to 9 mm. The cystic duct remnant is also slightly prominent measuring up to 9 mm. The common bile duct also measures 9 mm. The  distal common bile duct abruptly tapers immediately before the level of the ampulla. No filling defects within the duct  1.8 x 1.5 cm left adrenal nodule demonstrates profound loss of signal intensity throughout the nodule on out of phase dual echo images, compatible with high intracellular lipid in the setting of an adenoma.  Status post cholecystectomy. Mild diffuse loss of signal intensity throughout the hepatic parenchyma on out of phase dual echo images, indicative of very mild hepatic steatosis. No focal hepatic lesions are noted. The appearance of the pancreas, spleen and bilateral kidneys is unremarkable. Specifically, no pancreatic ductal dilatation. Extensive susceptibility artifact noted throughout the upper abdomen in the region of the gastroesophageal junction, right upper retroperitoneum (related to prior right adrenalectomy), and adjacent to the stomach, related to clips from prior surgeries.  IMPRESSION: 1. Very mild intra and extrahepatic biliary ductal dilatation, as above. This appears very similar to the prior CT scan 05/04/2013, although today's study clearly demonstrates that the perceived ductal enlargement in the porta hepatis was actually a combination of the adjacent common hepatic duct and cystic duct remnants rather than one large common bile duct as it appeared to be on the prior CT scan. The common hepatic duct, cystic duct remnant and common bile duct are all mildly dilated measuring up to 9 mm, and there is very mild intrahepatic biliary ductal dilatation. Although much of this could be attributable to functional dilatation in this post cholecystectomy patient, the common bile duct does abruptly taper immediately before the level of the ampulla such that a distal CBD stricture is not excluded. If there is clinical concern for biliary obstruction, further evaluation with ERCP may be warranted. 2. 1.8 x 1.5 cm left adrenal adenoma. 3. Mild hepatic steatosis. 4. Additional incidental  findings, as above.   Electronically Signed   By: Daniel  Entrikin M.D.   On: 06/03/2013 10:02   Mr Abd W/wo Cm/mrcp  06/03/2013   CLINICAL DATA:  Biliary tract dilatation.  EXAM: MRI ABDOMEN WITHOUT AND WITH CONTRAST (INCLUDING MRCP)  TECHNIQUE: Multiplanar multisequence MR imaging of the abdomen was performed both before and after the administration of intravenous contrast. Heavily T2-weighted images of the biliary and pancreatic ducts were obtained, and three-dimensional MRCP images were rendered by post processing.  CONTRAST:  13mL MULTIHANCE GADOBENATE DIMEGLUMINE 529 MG/ML IV SOLN  COMPARISON:  CT of the abdomen and pelvis 05/04/2013.  FINDINGS: MRCP images are slightly suboptimal related to gross patient motion. With these limitations in mind, these images demonstrate minimal intrahepatic biliary ductal dilatation. The common hepatic duct measures up to   9 mm. The cystic duct remnant is also slightly prominent measuring up to 9 mm. The common bile duct also measures 9 mm. The distal common bile duct abruptly tapers immediately before the level of the ampulla. No filling defects within the duct  1.8 x 1.5 cm left adrenal nodule demonstrates profound loss of signal intensity throughout the nodule on out of phase dual echo images, compatible with high intracellular lipid in the setting of an adenoma.  Status post cholecystectomy. Mild diffuse loss of signal intensity throughout the hepatic parenchyma on out of phase dual echo images, indicative of very mild hepatic steatosis. No focal hepatic lesions are noted. The appearance of the pancreas, spleen and bilateral kidneys is unremarkable. Specifically, no pancreatic ductal dilatation. Extensive susceptibility artifact noted throughout the upper abdomen in the region of the gastroesophageal junction, right upper retroperitoneum (related to prior right adrenalectomy), and adjacent to the stomach, related to clips from prior surgeries.  IMPRESSION: 1. Very mild  intra and extrahepatic biliary ductal dilatation, as above. This appears very similar to the prior CT scan 05/04/2013, although today's study clearly demonstrates that the perceived ductal enlargement in the porta hepatis was actually a combination of the adjacent common hepatic duct and cystic duct remnants rather than one large common bile duct as it appeared to be on the prior CT scan. The common hepatic duct, cystic duct remnant and common bile duct are all mildly dilated measuring up to 9 mm, and there is very mild intrahepatic biliary ductal dilatation. Although much of this could be attributable to functional dilatation in this post cholecystectomy patient, the common bile duct does abruptly taper immediately before the level of the ampulla such that a distal CBD stricture is not excluded. If there is clinical concern for biliary obstruction, further evaluation with ERCP may be warranted. 2. 1.8 x 1.5 cm left adrenal adenoma. 3. Mild hepatic steatosis. 4. Additional incidental findings, as above.   Electronically Signed   By: Vinnie Langton M.D.   On: 06/03/2013 10:02    CBC  Recent Labs Lab 06/24/13 1410 06/24/13 1744 06/25/13 0745 06/26/13 2145 06/27/13 0632  WBC 3.2* 2.9* 5.8 5.7 5.8  HGB 9.0* 8.9* 6.8* 8.5* 9.3*  HCT 25.1* 25.0* 18.7* 24.4* 26.4*  PLT 214 200 175 138* 155  MCV 86.3 85.0 85.8 86.2 86.3  MCH 30.9 30.3 31.2 30.0 30.4  MCHC 35.9 35.6 36.4* 34.8 35.2  RDW 14.9 14.7 14.7 14.3 14.4  LYMPHSABS 1.5  --   --   --   --   MONOABS 0.4  --   --   --   --   EOSABS 0.0  --   --   --   --   BASOSABS 0.0  --   --   --   --     Chemistries   Recent Labs Lab 06/24/13 1410 06/25/13 0745 06/26/13 2145 06/27/13 0632 06/28/13 0846  NA 141 138 144 142  --   K 3.9 3.3* 3.4* 3.6* 3.9  CL 104 106 116* 110  --   CO2 17* 19 18* 21  --   GLUCOSE 104* 85 82 90  --   BUN 4* <3* <3* <3*  --   CREATININE 0.55 0.60 0.46* 0.50  --   CALCIUM 7.8* 6.9* 6.7* 8.5  --   MG  --  1.3*   --   --   --   AST 112* 46* 34  --   --   ALT 40* 25  18  --   --   ALKPHOS 70 52 53  --   --   BILITOT 0.4 0.6 <0.2*  --   --    ------------------------------------------------------------------------------------------------------------------ CrCl is unknown because both a height and weight (above a minimum accepted value) are required for this calculation. ------------------------------------------------------------------------------------------------------------------ No results found for this basename: HGBA1C,  in the last 72 hours ------------------------------------------------------------------------------------------------------------------ No results found for this basename: CHOL, HDL, LDLCALC, TRIG, CHOLHDL, LDLDIRECT,  in the last 72 hours ------------------------------------------------------------------------------------------------------------------ No results found for this basename: TSH, T4TOTAL, FREET3, T3FREE, THYROIDAB,  in the last 72 hours ------------------------------------------------------------------------------------------------------------------ No results found for this basename: VITAMINB12, FOLATE, FERRITIN, TIBC, IRON, RETICCTPCT,  in the last 72 hours  Coagulation profile No results found for this basename: INR, PROTIME,  in the last 168 hours  No results found for this basename: DDIMER,  in the last 72 hours  Cardiac Enzymes No results found for this basename: CK, CKMB, TROPONINI, MYOGLOBIN,  in the last 168 hours ------------------------------------------------------------------------------------------------------------------ No components found with this basename: POCBNP,     Geradine Girt D.O. on 06/30/2013 at 12:28 PM  Between 7am to 7pm - Pager - [phone]  After 7pm go to [email or web address] - password [email or web address]  And look for the night coverage person covering for me after hours  [facility] Office  [phone]

## 2013-07-01 DIAGNOSIS — F319 Bipolar disorder, unspecified: Secondary | ICD-10-CM | POA: Diagnosis not present

## 2013-07-01 DIAGNOSIS — F332 Major depressive disorder, recurrent severe without psychotic features: Secondary | ICD-10-CM

## 2013-07-01 DIAGNOSIS — Z634 Disappearance and death of family member: Secondary | ICD-10-CM

## 2013-07-01 DIAGNOSIS — S66909A Unspecified injury of unspecified muscle, fascia and tendon at wrist and hand level, unspecified hand, initial encounter: Secondary | ICD-10-CM | POA: Diagnosis not present

## 2013-07-01 DIAGNOSIS — K5900 Constipation, unspecified: Secondary | ICD-10-CM | POA: Diagnosis not present

## 2013-07-01 DIAGNOSIS — S61509A Unspecified open wound of unspecified wrist, initial encounter: Secondary | ICD-10-CM | POA: Diagnosis not present

## 2013-07-01 DIAGNOSIS — I10 Essential (primary) hypertension: Secondary | ICD-10-CM | POA: Diagnosis not present

## 2013-07-01 MED ORDER — GI COCKTAIL ~~LOC~~
30.0000 mL | Freq: Three times a day (TID) | ORAL | Status: DC | PRN
  Filled 2013-07-01: qty 30

## 2013-07-01 MED ORDER — TRAMADOL HCL 50 MG PO TABS
50.0000 mg | ORAL_TABLET | Freq: Four times a day (QID) | ORAL | Status: DC | PRN
  Administered 2013-07-01 – 2013-07-03 (×2): 50 mg via ORAL
  Filled 2013-07-01 (×2): qty 1

## 2013-07-01 MED ORDER — LACTULOSE 10 GM/15ML PO SOLN
20.0000 g | Freq: Two times a day (BID) | ORAL | Status: DC
  Filled 2013-07-01 (×6): qty 30

## 2013-07-01 NOTE — Progress Notes (Signed)
After review and discussion with the bedside nurse on the evening of 06/30/13, it appears Ms Angel Price currently has an abdominal wound with green drainage that is covered with a bandage. (MRSA from this wound in 04/2013).  Ms Angel Price is on antibiotics for treatment, I assume for this wound. It is Infection Prevention's recommendation that the patient not be discharged to [facility] as she has signs and symptoms of an active infection and would be in the milieu and group sessions with all other patients. The risk of transmission to others is high with active infection when in shared common spaces. If it is in the best interest of Ms Angel Price to be be moved for psych care, she must be in a private room and on Contact precautions which often makes psych care more difficult. Please refer to Standard & Transmission based Precautions [facility] section, policy for further explanation.

## 2013-07-01 NOTE — Clinical Social Work Psych Note (Signed)
Psych CSW received notification that pt was declined at [facility].  Psych CSW spoke with pt yesterday and pt expressed interest in going home with outpatient f/u.  Psychiatry has been re-consulted for change in status to determine safe dc.  MD aware.  Nonnie Done, [place] [phone]  Clinical Social Work

## 2013-07-01 NOTE — Consult Note (Signed)
Psychiatry Follow Up Consult   Assessment: AXIS I:  Bereavement and Major Depression, Recurrent severe AXIS II:  Deferred AXIS III:   Past Medical History  Diagnosis Date  . Lupus   . 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 Hypertension    AXIS IV:  other psychosocial or environmental problems AXIS V:  41-50 serious symptoms  Plan:  Recommend psychiatric Inpatient admission when medically cleared.  Subjective:   Angel Price is a 48 y.o. female patient admitted after she cut her wrist  Patient seen chart reviewed.  Patient remains very anxious and depressed.  She minimizes her symptoms .  She appears to be tearful because she has not able to seen her son's grave yard.  She continued to endorse poor sleep and admitted that she has been not taking her medication for a while.  Patient endorses history of psychosis in the past.  She is very worried about her medical condition.  She appears easily irritable and remains guarded.  She maintained poor eye contact.  She endorsed some time crying spells, insomnia, lack of energy and feeling hopelessness and worthlessness.  She does not feel that she acquired a new medication however she continues to have significant symptoms of depression, anhedonia and feeling of hopelessness and worthlessness.  Patient requires inpatient psychiatric treatment.  Past Psychiatric History: Past Medical History  Diagnosis Date  . Lupus   . 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 Hypertension   States she has had multiple medication trials but that she has side effects or they "dont work" Recalls: Cymbalta, Effexor, Neurontin, Seroquel, Abilify, Xanax, Zoloft, Cogentin States she was hosptitalized once in [place] Denies current outpatient treatment She denies previous suicidal attempts but son claims she has tried several times by hanging and OD  reports that she has been smoking Cigarettes.  She has a 35 pack-year smoking history. She has  never used smokeless tobacco. She reports that she drinks alcohol. She reports that she does not use illicit drugs. Family History  Problem Relation Age of Onset  . Brain cancer Son   . Breast cancer Maternal Aunt   . Colon cancer Maternal Grandmother     late 40s, early 10s  . Lung cancer Father   . Liver disease Neg Hx            Allergies:   Allergies  Allergen Reactions  . Codeine Hives  . Morphine And Related     gastritis     Objective: Blood pressure 108/72, pulse 71, temperature 98 F (36.7 C), temperature source Oral, resp. rate 18, SpO2 98.00%.There is no weight on file to calculate BMI. No results found for this or any previous visit (from the past 72 hour(s)).   Current Facility-Administered Medications  Medication Dose Route Frequency Provider Last Rate Last Dose  . 0.9 %  sodium chloride infusion   Intravenous Continuous Theodis Blaze, MD 75 mL/hr at 06/26/13 [phone]    . benzonatate (TESSALON) capsule 100 mg  100 mg Oral BID PRN Maryann Mikhail, DO      . diphenhydrAMINE (BENADRYL) capsule 25-50 mg  25-50 mg Oral Q6H PRN Rhetta Mura Schorr, NP   25 mg at 06/30/13 2245  . docusate sodium (COLACE) capsule 100 mg  100 mg Oral BID Maryann Mikhail, DO   100 mg at 07/01/13 1112  . fluticasone (FLONASE) 50 MCG/ACT nasal spray 1 spray  1 spray Each Nare Daily Maryann Mikhail, DO   1  spray at 07/01/13 1112  . gi cocktail (Maalox,Lidocaine,Donnatal)  30 mL Oral TID PRN Geradine Girt, DO      . HYDROcodone-acetaminophen (NORCO/VICODIN) 5-325 MG per tablet 1-2 tablet  1-2 tablet Oral Q4H PRN Theodis Blaze, MD   2 tablet at 06/30/13 1026  . HYDROmorphone (DILAUDID) injection 1 mg  1 mg Intravenous Q2H PRN Theodis Blaze, MD   1 mg at 07/01/13 0517  . lactulose (CHRONULAC) 10 GM/15ML solution 20 g  20 g Oral BID Geradine Girt, DO      . ondansetron [facility]) tablet 4 mg  4 mg Oral Q6H PRN Theodis Blaze, MD   4 mg at 06/29/13 1844   Or  . ondansetron [facility]) injection 4 mg  4  mg Intravenous Q6H PRN Theodis Blaze, MD   4 mg at 06/29/13 0726  . polyethylene glycol (MIRALAX / GLYCOLAX) packet 17 g  17 g Oral Daily Maryann Mikhail, DO   17 g at 06/30/13 1024  . sodium chloride 0.9 % injection 3 mL  3 mL Intravenous Q12H Theodis Blaze, MD   3 mL at 06/30/13 1122    Psychiatric Specialty Exam:     Blood pressure 108/72, pulse 71, temperature 98 F (36.7 C), temperature source Oral, resp. rate 18, SpO2 98.00%.There is no weight on file to calculate BMI.  General Appearance: Disheveled, guarded and irritable   Eye Contact::  Fair  Speech:  Clear and Coherent  Volume:  Decreased  Mood:  Anxious and Depressed  Affect:  Depressed, tearful  Thought Process:  Coherent and Goal Directed  Orientation:  Full (Time, Place, and Person)  Thought Content:  events, symtpoms, worries, concerne  Suicidal Thoughts:  Denies (tends to minimize and deny)  Homicidal Thoughts:  No  Memory:  Immediate;   Fair Recent;   Fair Remote;   Fair  Judgement:  Fair  Insight:  Shallow  Psychomotor Activity:  Decreased  Concentration:  Fair  Recall:  [employer] of Knowledge:NA  Language: Fair  Akathisia:  No  Handed:    AIMS (if indicated):     Assets:  Desire for Improvement Housing Social Support  Sleep:      Musculoskeletal: Strength & Muscle Tone: unable to assess Gait & Station: in bed Patient leans: N/A  Treatment Plan Summary: The patient remains risk to herself.  Recommended inpatient psychiatric treatment for stabilization.  Continue sitter for patient's safety.  We will sign off however if you have any further questions please call [phone]  Arlyce Harman [place] 07/01/2013 1:16 PM

## 2013-07-01 NOTE — Clinical Social Work Psych Note (Signed)
Psych CSW spoke with [facility] and MD re: [facility] denial.  Per MD, MRSA is protocol of hx of a positive nasal swab and not currently active.  Arfeen, MD, Psychiatrist re-evaluated pt this afternoon (07/01/2013) and continues to recommend IP psych tx upon dc.  Charge at [facility] [place] made aware.  Pt is currently under review at [facility].  Pt will be voluntary admission.  Nonnie Done, [place] [phone]  Clinical Social Work

## 2013-07-01 NOTE — Progress Notes (Addendum)
[facility]                                                                              Patient Demographics  Angel Price, is a 48 y.o. female, DOB - 12/12/1965, JSE:[id number]  Admit date - 06/24/2013   Admitting Physician Theodis Blaze, MD  Outpatient Primary MD for the patient is Doran Heater, MD  LOS - 7   Chief Complaint  Patient presents with  . 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  Consults   Orthopedic surgery Psychiatry  DVT Prophylaxis  SCDs  Lab Results  Component Value Date   PLT 155 06/27/2013    Medications  Scheduled Meds: . docusate sodium  100 mg Oral BID  . fluticasone  1 spray Each Nare Daily  . lactulose  20 g Oral BID  . polyethylene glycol  17 g Oral Daily  . sodium chloride  3 mL Intravenous Q12H  . sulfamethoxazole-trimethoprim  1 tablet Oral Q12H   Continuous Infusions: . sodium chloride 75 mL/hr at 06/26/13 0452   PRN Meds:.benzonatate, diphenhydrAMINE, gi cocktail, HYDROcodone-acetaminophen, HYDROmorphone (DILAUDID) injection, ondansetron (ZOFRAN) IV,  ondansetron  Antibiotics    Anti-infectives   Start     Dose/Rate Route Frequency Ordered Stop   06/29/13 0000  sulfamethoxazole-trimethoprim (BACTRIM DS) 800-160 MG per tablet     1 tablet Oral Every 12 hours 06/29/13 0732 07/01/13 2359   06/25/13 0600  ceFAZolin (ANCEF) IVPB 2 g/50 mL premix     2 g 100 mL/hr over 30 Minutes Intravenous On call to O.R. 06/24/13 2243 06/26/13 0559   06/24/13 2245  sulfamethoxazole-trimethoprim (BACTRIM DS) 800-160 MG per tablet 1 tablet     1 tablet Oral Every 12 hours 06/24/13 2243 07/01/13 2159   06/24/13 1515  ceFAZolin (ANCEF) injection 1 g     1 g Intramuscular  Once 06/24/13 1507 06/24/13 1523        Subjective:   Still not a large BM  Objective:   Filed Vitals:   06/30/13 1300 06/30/13 2116 07/01/13 0512 07/01/13 0936  BP: 112/78 153/94 114/80 108/72  Pulse: 80 77 76 71  Temp: 98.1 F (36.7 C) 98.1 F (36.7 C) 98.1 F (36.7 C) 98 F (36.7 C)  TempSrc:    Oral  Resp: 18 18 18  18  SpO2: 100% 98% 94% 98%    Wt Readings from Last 3 Encounters:  06/06/13 59.421 kg (131 lb)  05/14/13 63.957 kg (141 lb)  05/12/13 63.957 kg (141 lb)     Intake/Output Summary (Last 24 hours) at 07/01/13 1023 Last data filed at 07/01/13 3086  Gross per 24 hour  Intake    240 ml  Output      0 ml  Net    240 ml    Exam  General: Well developed, well nourished, NAD, appears stated age  Cardiovascular: S1 S2 auscultated, no rubs, murmurs or gallops. Regular rate and rhythm.  Respiratory: Clear to auscultation bilaterally with equal chest rise  Abdomen: Soft, nontender, nondistended, + bowel sounds  Extremities: warm dry without cyanosis clubbing or edema. Right upper extremity currently wrapped, right hand edema  Neuro: AAOx3, some decreased sensation in the right hand, however no other focal deficits   Skin: Without rashes exudates or nodules, multiple tattoos   Psych: Normal affect and demeanor  Data Review   Micro Results No  results found for this or any previous visit (from the past 240 hour(s)).  Radiology Reports Ct Abdomen Pelvis W Contrast  06/06/2013   CLINICAL DATA:  Abdominal pain.  EXAM: CT ABDOMEN AND PELVIS WITH CONTRAST  TECHNIQUE: Multidetector CT imaging of the abdomen and pelvis was performed using the standard protocol following bolus administration of intravenous contrast.  CONTRAST:  135m OMNIPAQUE IOHEXOL 300 MG/ML  SOLN  COMPARISON:  CT of the abdomen and pelvis 05/04/2013.  FINDINGS: Lung Bases: Unremarkable.  Abdomen/Pelvis: Status post cholecystectomy, gastric bypass procedure and hysterectomy. Numerous surgical clips are noted superior and medial to the right kidney, potentially from prior right adrenalectomy. 2 cm left adrenal nodule is incompletely characterized, but unchanged compared to prior examination dating back to 04/18/2013 (and recently characterized as an adenoma on MRI 06/03/2013). Common bile duct appears slightly dilated measuring 9 mm in the porta hepatis, however, this is stable compared to prior studies. No significant intrahepatic biliary ductal dilatation. The appearance of the liver, pancreas, spleen and bilateral kidneys is unremarkable.  No significant volume of ascites. No pneumoperitoneum. No pathologic distention of small bowel. No definite lymphadenopathy identified within the abdomen or pelvis. Atherosclerosis throughout the abdominal and pelvic vasculature, without evidence of aneurysm. Ovaries are not confidently identified may be surgically absent or atrophic. Urinary bladder is unremarkable in appearance.  As with prior examinations, there is some amorphous soft tissue density in the anterior abdominal wall around the umbilicus, similar to prior study from 05/04/2013 which is favored to represent sequela of prior wound infection with anterior abdominal wall abscesses or fistulous tracts. Given the stability in size, persistent intra abdominal wall abscesses are unlikely, and  this is favored to represent chronic postoperative scar tissue at this time.  Musculoskeletal: There are no aggressive appearing lytic or blastic lesions noted in the visualized portions of the skeleton.  IMPRESSION: 1. No acute findings in the abdomen or pelvis to account for the patient's symptoms. 2. Persistent prominence of abnormal soft tissue in the anterior abdominal wall around the umbilicus, likely to represent resolving postoperative scar tissue. No definite abdominal wall abscess is confidently identified at this time, although the presence of persistent infected tissue with draining sinus tracts is not excluded, and clinical correlation with physical exam is recommended. 3. 2 cm left adrenal adenoma is unchanged. 4. Postoperative changes redemonstrated, as above. 5. Persistent prominence of the extrahepatic bile ducts, similar to prior studies.  This is favored to be functional postcholecystectomy bile duct dilatation rather than evidence of obstruction, particularly in correlation with prior MRI 06/03/2013 (see that report for further details).   Electronically Signed   By: Vinnie Langton M.D.   On: 06/06/2013 21:38   Mr 3d Recon At Scanner  06/03/2013   CLINICAL DATA:  Biliary tract dilatation.  EXAM: MRI ABDOMEN WITHOUT AND WITH CONTRAST (INCLUDING MRCP)  TECHNIQUE: Multiplanar multisequence MR imaging of the abdomen was performed both before and after the administration of intravenous contrast. Heavily T2-weighted images of the biliary and pancreatic ducts were obtained, and three-dimensional MRCP images were rendered by post processing.  CONTRAST:  43m MULTIHANCE GADOBENATE DIMEGLUMINE 529 MG/ML IV SOLN  COMPARISON:  CT of the abdomen and pelvis 05/04/2013.  FINDINGS: MRCP images are slightly suboptimal related to gross patient motion. With these limitations in mind, these images demonstrate minimal intrahepatic biliary ductal dilatation. The common hepatic duct measures up to 9 mm. The cystic  duct remnant is also slightly prominent measuring up to 9 mm. The common bile duct also measures 9 mm. The distal common bile duct abruptly tapers immediately before the level of the ampulla. No filling defects within the duct  1.8 x 1.5 cm left adrenal nodule demonstrates profound loss of signal intensity throughout the nodule on out of phase dual echo images, compatible with high intracellular lipid in the setting of an adenoma.  Status post cholecystectomy. Mild diffuse loss of signal intensity throughout the hepatic parenchyma on out of phase dual echo images, indicative of very mild hepatic steatosis. No focal hepatic lesions are noted. The appearance of the pancreas, spleen and bilateral kidneys is unremarkable. Specifically, no pancreatic ductal dilatation. Extensive susceptibility artifact noted throughout the upper abdomen in the region of the gastroesophageal junction, right upper retroperitoneum (related to prior right adrenalectomy), and adjacent to the stomach, related to clips from prior surgeries.  IMPRESSION: 1. Very mild intra and extrahepatic biliary ductal dilatation, as above. This appears very similar to the prior CT scan 05/04/2013, although today's study clearly demonstrates that the perceived ductal enlargement in the porta hepatis was actually a combination of the adjacent common hepatic duct and cystic duct remnants rather than one large common bile duct as it appeared to be on the prior CT scan. The common hepatic duct, cystic duct remnant and common bile duct are all mildly dilated measuring up to 9 mm, and there is very mild intrahepatic biliary ductal dilatation. Although much of this could be attributable to functional dilatation in this post cholecystectomy patient, the common bile duct does abruptly taper immediately before the level of the ampulla such that a distal CBD stricture is not excluded. If there is clinical concern for biliary obstruction, further evaluation with ERCP may  be warranted. 2. 1.8 x 1.5 cm left adrenal adenoma. 3. Mild hepatic steatosis. 4. Additional incidental findings, as above.   Electronically Signed   By: DVinnie LangtonM.D.   On: 06/03/2013 10:02   Mr Abd W/wo Cm/mrcp  06/03/2013   CLINICAL DATA:  Biliary tract dilatation.  EXAM: MRI ABDOMEN WITHOUT AND WITH CONTRAST (INCLUDING MRCP)  TECHNIQUE: Multiplanar multisequence MR imaging of the abdomen was performed both before and after the administration of intravenous contrast. Heavily T2-weighted images of the biliary and pancreatic ducts were obtained, and three-dimensional MRCP images were rendered by post processing.  CONTRAST:  148mMULTIHANCE GADOBENATE DIMEGLUMINE 529 MG/ML IV SOLN  COMPARISON:  CT of the abdomen and pelvis 05/04/2013.  FINDINGS:  MRCP images are slightly suboptimal related to gross patient motion. With these limitations in mind, these images demonstrate minimal intrahepatic biliary ductal dilatation. The common hepatic duct measures up to 9 mm. The cystic duct remnant is also slightly prominent measuring up to 9 mm. The common bile duct also measures 9 mm. The distal common bile duct abruptly tapers immediately before the level of the ampulla. No filling defects within the duct  1.8 x 1.5 cm left adrenal nodule demonstrates profound loss of signal intensity throughout the nodule on out of phase dual echo images, compatible with high intracellular lipid in the setting of an adenoma.  Status post cholecystectomy. Mild diffuse loss of signal intensity throughout the hepatic parenchyma on out of phase dual echo images, indicative of very mild hepatic steatosis. No focal hepatic lesions are noted. The appearance of the pancreas, spleen and bilateral kidneys is unremarkable. Specifically, no pancreatic ductal dilatation. Extensive susceptibility artifact noted throughout the upper abdomen in the region of the gastroesophageal junction, right upper retroperitoneum (related to prior right  adrenalectomy), and adjacent to the stomach, related to clips from prior surgeries.  IMPRESSION: 1. Very mild intra and extrahepatic biliary ductal dilatation, as above. This appears very similar to the prior CT scan 05/04/2013, although today's study clearly demonstrates that the perceived ductal enlargement in the porta hepatis was actually a combination of the adjacent common hepatic duct and cystic duct remnants rather than one large common bile duct as it appeared to be on the prior CT scan. The common hepatic duct, cystic duct remnant and common bile duct are all mildly dilated measuring up to 9 mm, and there is very mild intrahepatic biliary ductal dilatation. Although much of this could be attributable to functional dilatation in this post cholecystectomy patient, the common bile duct does abruptly taper immediately before the level of the ampulla such that a distal CBD stricture is not excluded. If there is clinical concern for biliary obstruction, further evaluation with ERCP may be warranted. 2. 1.8 x 1.5 cm left adrenal adenoma. 3. Mild hepatic steatosis. 4. Additional incidental findings, as above.   Electronically Signed   By: Vinnie Langton M.D.   On: 06/03/2013 10:02    CBC  Recent Labs Lab 06/24/13 1410 06/24/13 1744 06/25/13 0745 06/26/13 2145 06/27/13 0632  WBC 3.2* 2.9* 5.8 5.7 5.8  HGB 9.0* 8.9* 6.8* 8.5* 9.3*  HCT 25.1* 25.0* 18.7* 24.4* 26.4*  PLT 214 200 175 138* 155  MCV 86.3 85.0 85.8 86.2 86.3  MCH 30.9 30.3 31.2 30.0 30.4  MCHC 35.9 35.6 36.4* 34.8 35.2  RDW 14.9 14.7 14.7 14.3 14.4  LYMPHSABS 1.5  --   --   --   --   MONOABS 0.4  --   --   --   --   EOSABS 0.0  --   --   --   --   BASOSABS 0.0  --   --   --   --     Chemistries   Recent Labs Lab 06/24/13 1410 06/25/13 0745 06/26/13 2145 06/27/13 0632 06/28/13 0846  NA 141 138 144 142  --   K 3.9 3.3* 3.4* 3.6* 3.9  CL 104 106 116* 110  --   CO2 17* 19 18* 21  --   GLUCOSE 104* 85 82 90  --   BUN  4* <3* <3* <3*  --   CREATININE 0.55 0.60 0.46* 0.50  --   CALCIUM 7.8* 6.9* 6.7* 8.5  --  MG  --  1.3*  --   --   --   AST 112* 46* 34  --   --   ALT 40* 25 18  --   --   ALKPHOS 70 52 53  --   --   BILITOT 0.4 0.6 <0.2*  --   --    ------------------------------------------------------------------------------------------------------------------ CrCl is unknown because both a height and weight (above a minimum accepted value) are required for this calculation. ------------------------------------------------------------------------------------------------------------------ No results found for this basename: HGBA1C,  in the last 72 hours ------------------------------------------------------------------------------------------------------------------ No results found for this basename: CHOL, HDL, LDLCALC, TRIG, CHOLHDL, LDLDIRECT,  in the last 72 hours ------------------------------------------------------------------------------------------------------------------ No results found for this basename: TSH, T4TOTAL, FREET3, T3FREE, THYROIDAB,  in the last 72 hours ------------------------------------------------------------------------------------------------------------------ No results found for this basename: VITAMINB12, FOLATE, FERRITIN, TIBC, IRON, RETICCTPCT,  in the last 72 hours  Coagulation profile No results found for this basename: INR, PROTIME,  in the last 168 hours  No results found for this basename: DDIMER,  in the last 72 hours  Cardiac Enzymes No results found for this basename: CK, CKMB, TROPONINI, MYOGLOBIN,  in the last 168 hours ------------------------------------------------------------------------------------------------------------------ No components found with this basename: POCBNP,     Geradine Girt D.O. on 07/01/2013 at 10:23 AM  Between 7am to 7pm - Pager - [phone]  After 7pm go to [email or web address] - password [email or web address]  And look for the night coverage  person covering for me after hours  [facility]

## 2013-07-02 DIAGNOSIS — X789XXA Intentional self-harm by unspecified sharp object, initial encounter: Secondary | ICD-10-CM

## 2013-07-02 DIAGNOSIS — X838XXA Intentional self-harm by other specified means, initial encounter: Secondary | ICD-10-CM | POA: Diagnosis not present

## 2013-07-02 DIAGNOSIS — Y838 Other surgical procedures as the cause of abnormal reaction of the patient, or of later complication, without mention of misadventure at the time of the procedure: Secondary | ICD-10-CM | POA: Diagnosis not present

## 2013-07-02 DIAGNOSIS — A4902 Methicillin resistant Staphylococcus aureus infection, unspecified site: Secondary | ICD-10-CM

## 2013-07-02 DIAGNOSIS — R45851 Suicidal ideations: Secondary | ICD-10-CM

## 2013-07-02 DIAGNOSIS — I10 Essential (primary) hypertension: Secondary | ICD-10-CM | POA: Diagnosis not present

## 2013-07-02 DIAGNOSIS — S31109A Unspecified open wound of abdominal wall, unspecified quadrant without penetration into peritoneal cavity, initial encounter: Secondary | ICD-10-CM

## 2013-07-02 DIAGNOSIS — T8189XA Other complications of procedures, not elsewhere classified, initial encounter: Secondary | ICD-10-CM | POA: Diagnosis not present

## 2013-07-02 DIAGNOSIS — R7989 Other specified abnormal findings of blood chemistry: Secondary | ICD-10-CM | POA: Diagnosis not present

## 2013-07-02 DIAGNOSIS — S61509A Unspecified open wound of unspecified wrist, initial encounter: Secondary | ICD-10-CM | POA: Diagnosis not present

## 2013-07-02 MED ORDER — ARIPIPRAZOLE 5 MG PO TABS
5.0000 mg | ORAL_TABLET | Freq: Every day | ORAL | Status: DC
  Administered 2013-07-02 – 2013-07-03 (×2): 5 mg via ORAL
  Filled 2013-07-02 (×2): qty 1

## 2013-07-02 MED ORDER — ESCITALOPRAM OXALATE 10 MG PO TABS
10.0000 mg | ORAL_TABLET | Freq: Every day | ORAL | Status: DC
  Administered 2013-07-02: 10 mg via ORAL
  Filled 2013-07-02 (×2): qty 1

## 2013-07-02 MED ORDER — LACTULOSE 10 GM/15ML PO SOLN: 20.0000 g | Freq: Two times a day (BID) | ORAL | Status: DC | PRN

## 2013-07-02 MED ORDER — DOXYCYCLINE HYCLATE 100 MG PO TABS
100.0000 mg | ORAL_TABLET | Freq: Two times a day (BID) | ORAL | Status: DC
  Administered 2013-07-02 – 2013-07-03 (×3): 100 mg via ORAL
  Filled 2013-07-02 (×5): qty 1

## 2013-07-02 MED ORDER — GI COCKTAIL ~~LOC~~: 30.0000 mL | Freq: Three times a day (TID) | ORAL | Status: DC | PRN

## 2013-07-02 MED ORDER — DOXYCYCLINE HYCLATE 100 MG PO TABS: 100.0000 mg | ORAL_TABLET | Freq: Two times a day (BID) | ORAL | Status: DC

## 2013-07-02 NOTE — Clinical Social Work Psych Note (Addendum)
2:37pm- Psych CSW received notification from RN and sitter (at bedside) that psychiatrist has rounded since psych CSW spoke with Market researcher.  Pt, sitter and RN report psychiatric completed med adjustment and rec to the pt to be dc home tomorrow once she receives her meds.  Psych CSW attempted to confirm this information with [facility].  Per Otila Kluver, charge, unable to do so.  MD Vann notified.  2:06pm- Pt to dc to [facility] today per Medical Director.  MD made aware.  DC summary needed.   Report to be called to [phone] Transport provided by [facility] [phone]  Voluntary form on chart.    Nonnie Done, [place] [phone]  Clinical Social Work

## 2013-07-02 NOTE — Progress Notes (Signed)
Called on call social worker to update discharge planning.  Patient and sitter at bedside state psych doctor told patient that she could go home in AM after medications started, psych note not placed in chart until 5:54 PM- states patient needs continued inpatient treatment.  D/c summary done and await [place] bed.  Patient not under IVC and is a voluntary admission to [facility].  Eulogio Bear DO

## 2013-07-02 NOTE — Progress Notes (Signed)
Patient and bedside sitter told staff this afternoon that she had been seen by psych MD whom told her that after her meds were started tonight that she would be able to go home in the am.  Spoke with Barnett Applebaum in SW, Manuela Schwartz in Case Management, and Dr. Eliseo Squires to update regarding situation as patient was intended to be transferred to [facility].  Spoke with Dr. Adele Schilder around 1800, he explained that she should go to [facility] when a bed became available, and that patient must not have understood what was said, and that she should be seen by psych again prior to d/c.  Dr. Eliseo Squires informed.

## 2013-07-02 NOTE — Clinical Social Work Psych Note (Signed)
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  Culture of wound was ordered and preliminary results have been established- "no growth".  Documentation from [facility], NP, surgery service line (07/02/2013) indicated that cultures to date have been negative.    NP also reports/recommends, "Chronic abdominal wounds, 2 pinpoint openings without purulent drainage or surrounding cellulitis. On CT scan from March does not appear to have a EC fistula. There are no urgent surgical interventions warranted at this time. We recommend discharge to [facility] per primary team and [facility] and follow up with Dr. Arnoldo Morale in the near future. Apply dry dressing once daily."  Psych CSW has faxed this information to [facility] for review.  Psych CSW contacted and personally notified Charge, Otila Kluver at [facility] of this documentation.  Per MD, pt remains medically stable with wishes of returning home.  Psychiatry continues to recommend inpatient psychiatric treatment.  Psych CSW also requested a peer-to-peer with whom ever is reviewing pt information for admission.  MD Eliseo Squires more than willing to speak with admissions administrator to give first-hand account of woundcare/pt needs.    MD made aware of updates.  Psych CSW continuing to follow.  Nonnie Done, [place] [phone]  Clinical Social Work

## 2013-07-02 NOTE — Consult Note (Signed)
Reason for Consult: chronic abdominal wound Referring Physician: Eulogio Bear, DO  HPI: Angel Price is a 48 year old female admitted for a suicide attempt.  She has a history of HTN, lupus, hyperthyroidism, gastric bypass, small bowel resection and a chronic abdominal wound being managed by Dr. Arnoldo Morale.  She underwent a debridement in November and has been taking bactrim since then.  She was readmitted in February for increased drainage and subsequently discharged home with continued antibiotics.  We have been asked to evaluate the patient for continued drainage and [facility] reluctance to take the patient due to this.  The patient reports 2 sinus tracts that are draining intermittently.  Moderate amount of green drainage.  She denies fever, chills or sweats.  Appetite is fair, normal bowel movements.  She denies much abdominal pain.  She had a CT of abdomen in March which did not demonstrate a fistula, no acute intraabdominal abnormalities.  Cultures to date have been negative.     Past Medical History  Diagnosis Date  . Lupus   . 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 Hypertension     Past Surgical History  Procedure Laterality Date  . Abdominal surgery    . Abdominal hysterectomy  2013    [place]  . Debridement of abdominal wall abscess N/A 02/08/2013    Procedure: DEBRIDEMENT OF ABDOMINAL WALL ABSCESS;  Surgeon: Jamesetta So, MD;  Location: [facility];  Service: General;  Laterality: N/A;  . Gastric bypass       [place], first 2000, 2005/2006.  . Colonoscopy      in [place]  . Cholecystectomy    . Colonoscopy with propofol N/A 05/20/2013    Procedure: COLONOSCOPY WITH PROPOFOL (Procedure #2) At cecum@0957  total withdrawal time=;  Surgeon: Daneil Dolin, MD;  Location: [facility];  Service: Endoscopy;  Laterality: N/A;  . Esophagogastroduodenoscopy (egd) with propofol N/A 05/20/2013    Procedure: ESOPHAGOGASTRODUODENOSCOPY (EGD) WITH PROPOFOL (Procedure #1);  Surgeon: Daneil Dolin,  MD;  Location: [facility];  Service: Endoscopy;  Laterality: N/A;  . Esophageal biopsy  05/20/2013    Procedure: BIOPSIES OF ASCENDING AND SIGMOID COLON;  Surgeon: Daneil Dolin, MD;  Location: [facility];  Service: Endoscopy;;  . Wound exploration Right 06/24/2013    Procedure: exploration of traumatic wound right wrist;  Surgeon: Tennis Must, MD;  Location: [place];  Service: Orthopedics;  Laterality: Right;    Family History  Problem Relation Age of Onset  . Brain cancer Son   . Breast cancer Maternal Aunt   . Colon cancer Maternal Grandmother     late 62s, early 8s  . Lung cancer Father   . Liver disease Neg Hx     Social History:  reports that she has been smoking Cigarettes.  She has a 35 pack-year smoking history. She has never used smokeless tobacco. She reports that she drinks alcohol. She reports that she does not use illicit drugs.  Allergies:  Allergies  Allergen Reactions  . Codeine Hives  . Morphine And Related     gastritis    Medications:  Prior to Admission:  Prescriptions prior to admission  Medication Sig Dispense Refill  . ondansetron (ZOFRAN-ODT) 4 MG disintegrating tablet Take 4 mg by mouth every 8 (eight) hours as needed for nausea or vomiting. 4mg  ODT q4 hours prn nausea/vomit      . [DISCONTINUED] lisinopril (PRINIVIL) 10 MG tablet Take 1 tablet (10 mg total) by mouth daily.  30 tablet  1  . [DISCONTINUED] HYDROcodone-acetaminophen (NORCO/VICODIN) 5-325 MG per tablet Take 1 tablet by mouth every 6 (six) hours as needed for moderate pain.  20 tablet  0  . [DISCONTINUED] sulfamethoxazole-trimethoprim (SEPTRA DS) 800-160 MG per tablet Take 1 tablet by mouth 2 (two) times daily.  20 tablet  0   Scheduled: . docusate sodium  100 mg Oral BID  . fluticasone  1 spray Each Nare Daily  . lactulose  20 g Oral BID  . polyethylene glycol  17 g Oral Daily   Continuous:  OMV:[id number], diphenhydrAMINE, gi cocktail, HYDROcodone-acetaminophen, ondansetron,  traMADol  Results for orders placed during the hospital encounter of 06/24/13 (from the past 48 hour(s))  WOUND CULTURE     Status: None   Collection Time    07/01/13  5:34 PM      Result Value Ref Range   Specimen Description WOUND ABDOMEN     Special Requests NONE     Gram Stain       Value: NO WBC SEEN     NO SQUAMOUS EPITHELIAL CELLS SEEN     NO ORGANISMS SEEN     Performed at [facility]   Culture       Value: NO GROWTH     Performed at [place] PENDING      No results found.  Review of Systems  Constitutional: Negative for fever, chills and malaise/fatigue.  Respiratory: Negative for shortness of breath.   Cardiovascular: Negative for chest pain and palpitations.  Gastrointestinal: Positive for abdominal pain. Negative for nausea, vomiting, diarrhea and constipation.  Neurological: Negative for dizziness, tingling and weakness.   Blood pressure 101/69, pulse 66, temperature 97.6 F (36.4 C), temperature source Oral, resp. rate 18, SpO2 98.00%. Physical Exam  Constitutional: She is oriented to person, place, and time. She appears well-developed and well-nourished. No distress.  Cardiovascular: Normal rate, regular rhythm, normal heart sounds and intact distal pulses.  Exam reveals no gallop and no friction rub.   No murmur heard. Respiratory: Effort normal and breath sounds normal. No respiratory distress. She has no wheezes. She has no rales. She exhibits no tenderness.  GI: Soft. Bowel sounds are normal. She exhibits no distension and no mass. There is no tenderness. There is no rebound and no guarding.  Midline scar.  There are 2 pinpoint openings by the scar, dressing was removed, small amount of yellow drainage was noted.  There was no purulent drainage or erythema.  Neurological: She is alert and oriented to person, place, and time.  Skin: Skin is warm and dry. She is not diaphoretic.    Assessment/Plan: Chronic abdominal  wounds, 2 pinpoint openings without purulent drainage or surrounding cellulitis.  On CT scan from March does not appear to have a EC fistula.  There are no urgent surgical interventions warranted at this time.  We recommend discharge to [facility] per primary team and [facility] and follow up with Dr. Arnoldo Morale in the near future.  Apply dry dressing once daily.  Addysen Louth ANP-BC 07/02/2013, 9:22 AM

## 2013-07-02 NOTE — Discharge Summary (Addendum)
Physician Discharge Summary  Angel Price CHY:[id number] DOB: 1966/03/17 DOA: 06/24/2013  PCP: Doran Heater, MD  Admit date: 06/24/2013 Discharge date: 07/03/2013  Time spent: 45 minutes  Recommendations for Outpatient Follow-up:  Home today- cleared by psych Follow up with Dr. Tommy Medal Apply dry dressing once daily   Discharge Diagnoses:  Suicide attempt by lacerating right wrist Leukopenia Symptomatic acute blood loss anemia Transaminitis Hypokalemia Hypertension History of lupus History of hyperthyroidism  Discharge Condition: Stable  Diet recommendation: Regular  History of present illness:  Pt is 48 yo female who was brought to [facility] ED via EMS after found mother found her in the pool of blood from what appeared to be secondary to slicing the volar aspect of the right wrist. Pt unable to provide history due to somewhat altered mental status, per EMS report, copious amount of blood noted at the scene. Pt apparently attempted suicide and apparently diagnosed with depression 6 months ago after death of her child. She is currently reporting pain in the right wrist area, numbness and loss of sensation, unable to flex her wrist.  In ED, pt noted to be somnolent on arrival, Hg drop noted since March 22nd, 2015 (Hg was 15), Hg = 9. Hand surgery specialist consulted and [facility] asked to admit for further evaluation.    Hospital Course:  Suicide attempt by lacerating right wrist  -Orthopedics was consulted and conducted exploration of the wound  -Psychiatry consulted and recommended inpatient stay at [facility]  But now has been cleared to go home -Patient currently denies any suicidal ideations  -Attempted due to loss of a son to brain cancer  -patient is medically stable   Leukopenia  -Resoved, Unclear etiology   Chronic abd wound -doxy indefinitely- culture of area, NGTD -follow up with Dr. 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  Consultations: Orthopedic surgery  Psychiatry Surgery ID  Discharge Exam: Filed Vitals:   07/03/13 1244  BP: 116/81  Pulse: 87  Temp: 98.2 F (36.8 C)  Resp: 20     Discharge Instructions      Discharge Orders   Future Orders Complete By Expires   Diet - low sodium heart healthy  As directed    Diet general  As directed    Discharge instructions  As directed    Increase activity slowly  As directed        Medication List    STOP taking these medications       lisinopril 10 MG tablet  Commonly known as:  PRINIVIL     sulfamethoxazole-trimethoprim 800-160 MG per tablet  Commonly known as:  SEPTRA DS      TAKE these medications       ARIPiprazole 5 MG tablet  Commonly known as:  ABILIFY  Take 1 tablet (5 mg total) by mouth  daily.     doxycycline 100 MG tablet  Commonly known as:  VIBRA-TABS  Take 1 tablet (100 mg total) by mouth every 12 (twelve) hours.     escitalopram 10 MG tablet  Commonly known as:  LEXAPRO  Take 1 tablet (10 mg total) by mouth at bedtime.     fluticasone 50 MCG/ACT nasal spray  Commonly known as:  FLONASE  Place 1 spray into both nostrils daily.     HYDROcodone-acetaminophen 5-325 MG per tablet  Commonly known as:  NORCO/VICODIN  Take 1 tablet by mouth every 6 (six) hours as needed for moderate pain.      ondansetron 4 MG disintegrating tablet  Commonly known as:  ZOFRAN-ODT  Take 4 mg by mouth every 8 (eight) hours as needed for nausea or vomiting. 10m ODT q4 hours prn nausea/vomit       Allergies  Allergen Reactions  . Codeine Hives  . Morphine And Related     gastritis   Follow-up Information   Follow up with KTennis Must MD In 1 week.   Specialty:  Orthopedic Surgery   Contact information:   [phone]      Schedule an appointment as soon as possible for a visit to follow up.       The results of significant diagnostics from this hospitalization (including imaging, microbiology, ancillary and laboratory) are listed below for reference.    Significant Diagnostic Studies: Ct Abdomen Pelvis W Contrast  06/06/2013   CLINICAL DATA:  Abdominal pain.  EXAM: CT ABDOMEN AND PELVIS WITH CONTRAST  TECHNIQUE: Multidetector CT imaging of the abdomen and pelvis was performed using the standard protocol following bolus administration of intravenous contrast.  CONTRAST:  105mOMNIPAQUE IOHEXOL 300 MG/ML  SOLN  COMPARISON:  CT of the abdomen and pelvis 05/04/2013.  FINDINGS: Lung Bases: Unremarkable.  Abdomen/Pelvis: Status post cholecystectomy, gastric bypass procedure and hysterectomy. Numerous surgical clips are noted superior and medial to the right kidney, potentially from prior right adrenalectomy. 2 cm left adrenal nodule is incompletely characterized, but unchanged compared to prior examination dating back to 04/18/2013 (and recently characterized as an adenoma on MRI 06/03/2013). Common bile duct appears slightly dilated measuring 9 mm in the porta hepatis, however, this is stable compared to prior studies. No significant intrahepatic biliary ductal dilatation. The appearance of the liver, pancreas, spleen and bilateral kidneys is unremarkable.  No significant volume of ascites. No pneumoperitoneum. No pathologic distention of small bowel. No  definite lymphadenopathy identified within the abdomen or pelvis. Atherosclerosis throughout the abdominal and pelvic vasculature, without evidence of aneurysm. Ovaries are not confidently identified may be surgically absent or atrophic. Urinary bladder is unremarkable in appearance.  As with prior examinations, there is some amorphous soft tissue density in the anterior abdominal wall around the umbilicus, similar to prior study from 05/04/2013 which is favored to represent sequela of prior wound infection with anterior abdominal wall abscesses or fistulous tracts. Given the stability in size, persistent intra abdominal wall abscesses are unlikely, and this is favored to represent chronic postoperative scar tissue at this time.  Musculoskeletal: There are no aggressive appearing lytic or blastic lesions noted in the visualized portions of the skeleton.  IMPRESSION: 1. No acute findings in the abdomen or pelvis to account for the patient's symptoms. 2. Persistent prominence of abnormal soft tissue in the anterior abdominal wall around the umbilicus, likely to represent resolving postoperative scar tissue. No definite abdominal wall abscess is confidently  identified at this time, although the presence of persistent infected tissue with draining sinus tracts is not excluded, and clinical correlation with physical exam is recommended. 3. 2 cm left adrenal adenoma is unchanged. 4. Postoperative changes redemonstrated, as above. 5. Persistent prominence of the extrahepatic bile ducts, similar to prior studies. This is favored to be functional postcholecystectomy bile duct dilatation rather than evidence of obstruction, particularly in correlation with prior MRI 06/03/2013 (see that report for further details).   Electronically Signed   By: Vinnie Langton M.D.   On: 06/06/2013 21:38   Mr 3d Recon At Scanner  06/03/2013   CLINICAL DATA:  Biliary tract dilatation.  EXAM: MRI ABDOMEN WITHOUT AND WITH CONTRAST (INCLUDING  MRCP)  TECHNIQUE: Multiplanar multisequence MR imaging of the abdomen was performed both before and after the administration of intravenous contrast. Heavily T2-weighted images of the biliary and pancreatic ducts were obtained, and three-dimensional MRCP images were rendered by post processing.  CONTRAST:  56m MULTIHANCE GADOBENATE DIMEGLUMINE 529 MG/ML IV SOLN  COMPARISON:  CT of the abdomen and pelvis 05/04/2013.  FINDINGS: MRCP images are slightly suboptimal related to gross patient motion. With these limitations in mind, these images demonstrate minimal intrahepatic biliary ductal dilatation. The common hepatic duct measures up to 9 mm. The cystic duct remnant is also slightly prominent measuring up to 9 mm. The common bile duct also measures 9 mm. The distal common bile duct abruptly tapers immediately before the level of the ampulla. No filling defects within the duct  1.8 x 1.5 cm left adrenal nodule demonstrates profound loss of signal intensity throughout the nodule on out of phase dual echo images, compatible with high intracellular lipid in the setting of an adenoma.  Status post cholecystectomy. Mild diffuse loss of signal intensity throughout the hepatic parenchyma on out of phase dual echo images, indicative of very mild hepatic steatosis. No focal hepatic lesions are noted. The appearance of the pancreas, spleen and bilateral kidneys is unremarkable. Specifically, no pancreatic ductal dilatation. Extensive susceptibility artifact noted throughout the upper abdomen in the region of the gastroesophageal junction, right upper retroperitoneum (related to prior right adrenalectomy), and adjacent to the stomach, related to clips from prior surgeries.  IMPRESSION: 1. Very mild intra and extrahepatic biliary ductal dilatation, as above. This appears very similar to the prior CT scan 05/04/2013, although today's study clearly demonstrates that the perceived ductal enlargement in the porta hepatis was actually  a combination of the adjacent common hepatic duct and cystic duct remnants rather than one large common bile duct as it appeared to be on the prior CT scan. The common hepatic duct, cystic duct remnant and common bile duct are all mildly dilated measuring up to 9 mm, and there is very mild intrahepatic biliary ductal dilatation. Although much of this could be attributable to functional dilatation in this post cholecystectomy patient, the common bile duct does abruptly taper immediately before the level of the ampulla such that a distal CBD stricture is not excluded. If there is clinical concern for biliary obstruction, further evaluation with ERCP may be warranted. 2. 1.8 x 1.5 cm left adrenal adenoma. 3. Mild hepatic steatosis. 4. Additional incidental findings, as above.   Electronically Signed   By: DVinnie LangtonM.D.   On: 06/03/2013 10:02   Mr Abd W/wo Cm/mrcp  06/03/2013   CLINICAL DATA:  Biliary tract dilatation.  EXAM: MRI ABDOMEN WITHOUT AND WITH CONTRAST (INCLUDING MRCP)  TECHNIQUE: Multiplanar multisequence MR imaging of the abdomen was performed  both before and after the administration of intravenous contrast. Heavily T2-weighted images of the biliary and pancreatic ducts were obtained, and three-dimensional MRCP images were rendered by post processing.  CONTRAST:  7m MULTIHANCE GADOBENATE DIMEGLUMINE 529 MG/ML IV SOLN  COMPARISON:  CT of the abdomen and pelvis 05/04/2013.  FINDINGS: MRCP images are slightly suboptimal related to gross patient motion. With these limitations in mind, these images demonstrate minimal intrahepatic biliary ductal dilatation. The common hepatic duct measures up to 9 mm. The cystic duct remnant is also slightly prominent measuring up to 9 mm. The common bile duct also measures 9 mm. The distal common bile duct abruptly tapers immediately before the level of the ampulla. No filling defects within the duct  1.8 x 1.5 cm left adrenal nodule demonstrates profound loss of  signal intensity throughout the nodule on out of phase dual echo images, compatible with high intracellular lipid in the setting of an adenoma.  Status post cholecystectomy. Mild diffuse loss of signal intensity throughout the hepatic parenchyma on out of phase dual echo images, indicative of very mild hepatic steatosis. No focal hepatic lesions are noted. The appearance of the pancreas, spleen and bilateral kidneys is unremarkable. Specifically, no pancreatic ductal dilatation. Extensive susceptibility artifact noted throughout the upper abdomen in the region of the gastroesophageal junction, right upper retroperitoneum (related to prior right adrenalectomy), and adjacent to the stomach, related to clips from prior surgeries.  IMPRESSION: 1. Very mild intra and extrahepatic biliary ductal dilatation, as above. This appears very similar to the prior CT scan 05/04/2013, although today's study clearly demonstrates that the perceived ductal enlargement in the porta hepatis was actually a combination of the adjacent common hepatic duct and cystic duct remnants rather than one large common bile duct as it appeared to be on the prior CT scan. The common hepatic duct, cystic duct remnant and common bile duct are all mildly dilated measuring up to 9 mm, and there is very mild intrahepatic biliary ductal dilatation. Although much of this could be attributable to functional dilatation in this post cholecystectomy patient, the common bile duct does abruptly taper immediately before the level of the ampulla such that a distal CBD stricture is not excluded. If there is clinical concern for biliary obstruction, further evaluation with ERCP may be warranted. 2. 1.8 x 1.5 cm left adrenal adenoma. 3. Mild hepatic steatosis. 4. Additional incidental findings, as above.   Electronically Signed   By: DVinnie LangtonM.D.   On: 06/03/2013 10:02    Microbiology: Recent Results (from the past 240 hour(s))  WOUND CULTURE     Status:  None   Collection Time    07/01/13  5:34 PM      Result Value Ref Range Status   Specimen Description WOUND ABDOMEN   Final   Special Requests NONE   Final   Gram Stain     Final   Value: NO WBC SEEN     NO SQUAMOUS EPITHELIAL CELLS SEEN     NO ORGANISMS SEEN     [facility]formed at [facility]  Culture     Final   Value: NO GROWTH 1 DAY     Performed at [facility]  Report Status PENDING   Incomplete     Labs: Basic Metabolic Panel:  Recent Labs Lab 06/26/13 2145 06/27/13 0632 06/28/13 0846  NA 144 142  --   K 3.4* 3.6* 3.9  CL 116* 110  --   CO2 18* 21  --  GLUCOSE 82 90  --   BUN <3* <3*  --   CREATININE 0.46* 0.50  --   CALCIUM 6.7* 8.5  --    Liver Function Tests:  Recent Labs Lab 06/26/13 2145  AST 34  ALT 18  ALKPHOS 53  BILITOT <0.2*  PROT 4.3*  ALBUMIN 2.0*   No results found for this basename: LIPASE, AMYLASE,  in the last 168 hours No results found for this basename: AMMONIA,  in the last 168 hours CBC:  Recent Labs Lab 06/26/13 2145 06/27/13 0632  WBC 5.7 5.8  HGB 8.5* 9.3*  HCT 24.4* 26.4*  MCV 86.2 86.3  PLT 138* 155   Cardiac Enzymes: No results found for this basename: CKTOTAL, CKMB, CKMBINDEX, TROPONINI,  in the last 168 hours BNP: BNP (last 3 results) No results found for this basename: PROBNP,  in the last 8760 hours CBG: No results found for this basename: GLUCAP,  in the last 168 hours     Signed:  Geradine Girt  [facility] 07/03/2013, 5:47 PM

## 2013-07-02 NOTE — Consult Note (Signed)
Appreciate Infectious Disease input.  On Doxycycline.  No surgical intervention needed.  May follow-up with Dr. Arnoldo Morale as outpatient.  Imogene Burn. Georgette Dover, MD, [facility] Surgery  General/ Trauma Surgery  07/02/2013 3:47 PM

## 2013-07-02 NOTE — Consult Note (Signed)
[facility]    Date of Admission:  06/24/2013  Date of Consult:  07/02/2013  Reason for Consult: chronic draining MRSA wound Referring Physician: Dr. Eliseo Squires   HPI: Angel Price is an 48 y.o. 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 She has been to the ED recently and placed on bactrim (She says she has been on abx for months and months).  She was brought to the emergency department on this admission after her mother found her and pulled blood and what appeared to be of her having sliced the volar aspect of her right wrist. She was a bit confused and brought into the ER. She has had a prior suicide attempt 6 months ago after the death of her child. She was admitted to the medicine first for clearance prior to being sent to behavioral health. I was consulted because of the concerns that they will health had with regards to her chronic draining abdominal wound. Her wound has been examined also by general surgery who did not feel that there is an urgent need for surgical intervention at this time.  I surely think that her wound and at this point in time be managed with chronic oral antibiotics wound care and followup with myself and with surgery. I do suspect at some point down the road she may need some type of debridement given the persistence of drainage but there is no definable abscess on recent imaging and certainly this abdominal wound is the least of her problems at this point in time.  With regards to infection prevention policies I feel this patient could safely be treated at the behavioral health hospital as long as she keeps her wound covered and proper hand hygiene practices are implemented. I certainly do not feel that this wound should prevent her from getting the cortical psychiatric help but she still does feel he needs at this point in  time.     Past Medical History  Diagnosis Date  . Lupus   . 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 Hypertension     Past Surgical History  Procedure Laterality Date  . Abdominal surgery    . Abdominal hysterectomy  2013    [place]  . Debridement of abdominal wall abscess N/A 02/08/2013    Procedure: DEBRIDEMENT OF ABDOMINAL WALL ABSCESS;  Surgeon: Jamesetta So, MD;  Location: [facility];  Service: General;  Laterality: N/A;  . Gastric bypass       [place], first 2000, 2005/2006.  . Colonoscopy      in [place]  . Cholecystectomy    . Colonoscopy with propofol N/A 05/20/2013    Procedure: COLONOSCOPY WITH PROPOFOL (Procedure #2) At cecum@0957  total withdrawal time=;  Surgeon: Daneil Dolin, MD;  Location: [facility];  Service: Endoscopy;  Laterality: N/A;  . Esophagogastroduodenoscopy (egd) with propofol N/A 05/20/2013    Procedure: ESOPHAGOGASTRODUODENOSCOPY (EGD) WITH PROPOFOL (Procedure #1);  Surgeon: Daneil Dolin, MD;  Location: [facility];  Service: Endoscopy;  Laterality: N/A;  . Esophageal biopsy  05/20/2013    Procedure: BIOPSIES OF ASCENDING AND SIGMOID COLON;  Surgeon: Daneil Dolin, MD;  Location: [facility];  Service: Endoscopy;;  . Wound exploration Right 06/24/2013    Procedure: exploration of traumatic wound right wrist;  Surgeon: Tennis Must, MD;  Location: [place] [facility];  Service: Orthopedics;  Laterality: Right;  ergies:  Allergies  Allergen Reactions  . Codeine Hives  . Morphine And Related     gastritis     Medications: I have reviewed patients current medications as documented in Epic Anti-infectives   Start     Dose/Rate Route Frequency Ordered Stop   07/02/13 1145  doxycycline (VIBRA-TABS) tablet 100 mg     100 mg Oral Every 12 hours 07/02/13 1131     06/29/13 0000  sulfamethoxazole-trimethoprim (BACTRIM DS) 800-160 MG per tablet     1 tablet Oral Every 12 hours 06/29/13 0732 07/01/13 2359   06/25/13 0600  ceFAZolin (ANCEF) IVPB 2 g/50 mL premix     2  g 100 mL/hr over 30 Minutes Intravenous On call to O.R. 06/24/13 2243 06/26/13 0559   06/24/13 2245  sulfamethoxazole-trimethoprim (BACTRIM DS) 800-160 MG per tablet 1 tablet     1 tablet Oral Every 12 hours 06/24/13 2243 07/01/13 1112   06/24/13 1515  ceFAZolin (ANCEF) injection 1 g     1 g Intramuscular  Once 06/24/13 1507 06/24/13 1523      Social History:  reports that she has been smoking Cigarettes.  She has a 35 pack-year smoking history. She has never used smokeless tobacco. She reports that she drinks alcohol. She reports that she does not use illicit drugs.  Family History  Problem Relation Age of Onset  . Brain cancer Son   . Breast cancer Maternal Aunt   . Colon cancer Maternal Grandmother     late 68s, early 22s  . Lung cancer Father   . Liver disease Neg Hx     As in HPI and primary teams notes otherwise 12 point review of systems is negative  Blood pressure 101/69, pulse 66, temperature 97.6 F (36.4 C), temperature source Oral, resp. rate 18, SpO2 98.00%. General: Alert and awake, oriented x3, not in any acute distress. HEENT: anicteric sclera, pupils reactive to light and accommodation, EOMI, oropharynx clear and without exudate CVS regular rate, normal r,  no murmur rubs or gallops Chest: clear to auscultation bilaterally, no wheezing, rales or rhonchi Abdomen: : Supple wounds scars see pictures there is some purulent drainage in a few sites      Extremities: no  clubbing or edema noted bilaterally Skin: no rashes Neuro: nonfocal, strength and sensation intact   Results for orders placed during the hospital encounter of 06/24/13 (from the past 48 hour(s))  WOUND CULTURE     Status: None   Collection Time    07/01/13  5:34 PM      Result Value Ref Range   Specimen Description WOUND ABDOMEN     Special Requests NONE     Gram Stain       Value: NO WBC SEEN     NO SQUAMOUS EPITHELIAL CELLS SEEN     NO ORGANISMS SEEN     Performed at [facility]   Culture       Value: NO GROWTH     Performed at [facility]   Report Status PENDING        Component Value Date/Time   [facility] WOUND ABDOMEN 07/01/2013 1734   [place] 07/01/2013 1734   CULT  Value: NO GROWTH Performed at [facility] 07/01/2013 [place] PENDING 07/01/2013 1734   No results found.   Recent Results (from the past 720 hour(s))  WOUND CULTURE     Status: None   Collection Time    07/01/13  5:34 PM  Result Value Ref Range Status   Specimen Description WOUND ABDOMEN   Final   Special Requests NONE   Final   Gram Stain     Final   Value: NO WBC SEEN     NO SQUAMOUS EPITHELIAL CELLS SEEN     NO ORGANISMS SEEN     Performed at [facility]   Culture     Final   Value: NO GROWTH     Performed at [facility]   Report Status PENDING   Incomplete     Impression/Recommendation  Active Problems:   Wrist laceration   MDD (major depressive disorder), recurrent episode, severe   Angel Price is a 48 y.o. 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 She does NOT have fistula with bowel on CT scan but may have a complicated nidus of infection in her soft tissues that has not been seen on CT imaging. She is admitted with suicide attempt  #1 Chronic draining wounds:  --CCS feels no need for urgent surgery ---would give her doxy 100 mg bid presumign this is same MRSA culprit as before --she should continue on this indefinitely until this is resolved and may ultimately require some type of debridement --I would be happy to follow her up in my clinic  #2 MRSA IP issues: I feel pt should be allowed to go to [facility] as long as she keeps her wound covered and proper hand hygeine is practiced. I DONT think the MRSA should prevent her from getting CRITICAL psychiatric care she needs  #3 Screening: check for HIV, (she is hep b and C negative)  I will arrange HSFU in my clinic in the next 3-4 weeks  Please  call with further questions, I will sign off for now.   07/02/2013, 12:48 PM   Thank you so much for this interesting consult  [facility] [place] [phone] (pager) [phone] (office) 07/02/2013, 12:48 PM  [place] 07/02/2013, 12:48 PM

## 2013-07-02 NOTE — Progress Notes (Signed)
[facility]                                                                              Patient Demographics  Angel Price, is a 48 y.o. female, DOB - May 06, 1965, RDE:[id number]  Admit date - 06/24/2013   Admitting Physician Theodis Blaze, MD  Outpatient Primary MD for the patient is Doran Heater, MD  LOS - 8   Chief Complaint  Patient presents with  . Suicide Attempt        Assessment & Plan   Suicide attempt by lacerating right wrist -await inpatient bed-refused by [facility] -patient wants to go home D/c summ done 4/14 by Dr. 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  Consults   Orthopedic surgery Psychiatry  DVT Prophylaxis  SCDs  Lab Results  Component Value Date   PLT 155 06/27/2013    Medications  Scheduled Meds: . docusate sodium  100 mg Oral BID  . fluticasone  1 spray Each Nare Daily  . lactulose  20 g Oral BID  . polyethylene glycol  17 g Oral Daily   Continuous Infusions:   PRN Meds:.benzonatate, diphenhydrAMINE, gi cocktail, HYDROcodone-acetaminophen, ondansetron, traMADol  Antibiotics     Anti-infectives   Start     Dose/Rate Route Frequency Ordered Stop   06/29/13 0000  sulfamethoxazole-trimethoprim (BACTRIM DS) 800-160 MG per tablet     1 tablet Oral Every 12 hours 06/29/13 0732 07/01/13 2359   06/25/13 0600  ceFAZolin (ANCEF) IVPB 2 g/50 mL premix     2 g 100 mL/hr over 30 Minutes Intravenous On call to O.R. 06/24/13 2243 06/26/13 0559   06/24/13 2245  sulfamethoxazole-trimethoprim (BACTRIM DS) 800-160 MG per tablet 1 tablet     1 tablet Oral Every 12 hours 06/24/13 2243 07/01/13 1112   06/24/13 1515  ceFAZolin (ANCEF) injection 1 g     1 g Intramuscular  Once 06/24/13 1507 06/24/13 1523        Subjective:   Feeling better No pain  Objective:   Filed Vitals:   07/01/13 0936 07/01/13 1424 07/01/13 2049 07/02/13 0519  BP: 108/72 116/90 111/76 101/69  Pulse: 71 78 81 66  Temp: 98 F (36.7 C) 97.8 F (36.6 C) 98.4 F (36.9 C) 97.6 F (36.4 C)  TempSrc: Oral Oral Oral Oral  Resp: '18 18 18 ' 18  SpO2: 98% 100% 99% 98%    Wt Readings from Last 3 Encounters:  06/06/13 59.421 kg (131 lb)  05/14/13 63.957 kg (141 lb)  05/12/13 63.957 kg (141 lb)     Intake/Output Summary (Last 24 hours) at 07/02/13 0919 Last data filed at 07/01/13 1800  Gross per 24 hour  Intake    480 ml  Output      0 ml  Net    480 ml    Exam  General: Well developed, well nourished, NAD, appears stated age  Cardiovascular: S1 S2 auscultated, no rubs, murmurs or gallops. Regular rate and rhythm.  Respiratory: Clear to auscultation bilaterally with equal chest rise  Abdomen: 2 areas of drainage on abd- minimal amounts  Extremities: warm dry without cyanosis clubbing or edema.   Skin: Without rashes exudates or nodules, multiple tattoos   Psych: Normal affect and demeanor  Data Review   Micro Results Recent Results (from the past 240 hour(s))  WOUND CULTURE     Status: None   Collection Time    07/01/13  5:34 PM      Result Value Ref Range Status   Specimen  Description WOUND ABDOMEN   Final   Special Requests NONE   Final   Gram Stain     Final   Value: NO WBC SEEN     NO SQUAMOUS EPITHELIAL CELLS SEEN     NO ORGANISMS SEEN     Performed at [facility]   Culture     Final   Value: NO GROWTH     Performed at [facility]   Report Status PENDING   Incomplete    Radiology Reports Ct Abdomen Pelvis W Contrast  06/06/2013   CLINICAL DATA:  Abdominal pain.  EXAM: CT ABDOMEN AND PELVIS WITH CONTRAST  TECHNIQUE: Multidetector CT imaging of the abdomen and pelvis was performed using the standard protocol following bolus administration of intravenous contrast.  CONTRAST:  178m OMNIPAQUE IOHEXOL 300 MG/ML  SOLN  COMPARISON:  CT of the abdomen and pelvis 05/04/2013.  FINDINGS: Lung Bases: Unremarkable.  Abdomen/Pelvis: Status post cholecystectomy, gastric bypass procedure and hysterectomy. Numerous surgical clips are noted superior and medial to the right kidney, potentially from prior right adrenalectomy. 2 cm left adrenal nodule is incompletely characterized, but unchanged compared to prior examination dating back to 04/18/2013 (and recently characterized as an adenoma on MRI 06/03/2013). Common bile duct appears slightly dilated measuring 9 mm in the porta hepatis, however, this is stable compared to prior studies. No significant intrahepatic biliary ductal dilatation. The appearance of the liver, pancreas, spleen and bilateral kidneys is unremarkable.  No significant volume of ascites. No pneumoperitoneum. No pathologic distention of small bowel. No definite lymphadenopathy identified within the abdomen or pelvis. Atherosclerosis throughout the abdominal and pelvic vasculature, without evidence of aneurysm. Ovaries are not confidently identified may be surgically absent or atrophic. Urinary bladder is unremarkable in appearance.  As with prior examinations, there is some amorphous soft tissue density in the anterior abdominal wall around the  umbilicus, similar to prior study from 05/04/2013 which is favored to represent sequela of prior wound infection with anterior abdominal wall abscesses or fistulous tracts. Given the stability in size, persistent intra abdominal wall abscesses are unlikely, and this is favored to represent chronic postoperative scar tissue at this time.  Musculoskeletal: There are no aggressive appearing lytic or blastic lesions noted in the visualized portions of the skeleton.  IMPRESSION: 1. No acute findings in the abdomen  or pelvis to account for the patient's symptoms. 2. Persistent prominence of abnormal soft tissue in the anterior abdominal wall around the umbilicus, likely to represent resolving postoperative scar tissue. No definite abdominal wall abscess is confidently identified at this time, although the presence of persistent infected tissue with draining sinus tracts is not excluded, and clinical correlation with physical exam is recommended. 3. 2 cm left adrenal adenoma is unchanged. 4. Postoperative changes redemonstrated, as above. 5. Persistent prominence of the extrahepatic bile ducts, similar to prior studies. This is favored to be functional postcholecystectomy bile duct dilatation rather than evidence of obstruction, particularly in correlation with prior MRI 06/03/2013 (see that report for further details).   Electronically Signed   By: Vinnie Langton M.D.   On: 06/06/2013 21:38   Mr 3d Recon At Scanner  06/03/2013   CLINICAL DATA:  Biliary tract dilatation.  EXAM: MRI ABDOMEN WITHOUT AND WITH CONTRAST (INCLUDING MRCP)  TECHNIQUE: Multiplanar multisequence MR imaging of the abdomen was performed both before and after the administration of intravenous contrast. Heavily T2-weighted images of the biliary and pancreatic ducts were obtained, and three-dimensional MRCP images were rendered by post processing.  CONTRAST:  33m MULTIHANCE GADOBENATE DIMEGLUMINE 529 MG/ML IV SOLN  COMPARISON:  CT of the abdomen  and pelvis 05/04/2013.  FINDINGS: MRCP images are slightly suboptimal related to gross patient motion. With these limitations in mind, these images demonstrate minimal intrahepatic biliary ductal dilatation. The common hepatic duct measures up to 9 mm. The cystic duct remnant is also slightly prominent measuring up to 9 mm. The common bile duct also measures 9 mm. The distal common bile duct abruptly tapers immediately before the level of the ampulla. No filling defects within the duct  1.8 x 1.5 cm left adrenal nodule demonstrates profound loss of signal intensity throughout the nodule on out of phase dual echo images, compatible with high intracellular lipid in the setting of an adenoma.  Status post cholecystectomy. Mild diffuse loss of signal intensity throughout the hepatic parenchyma on out of phase dual echo images, indicative of very mild hepatic steatosis. No focal hepatic lesions are noted. The appearance of the pancreas, spleen and bilateral kidneys is unremarkable. Specifically, no pancreatic ductal dilatation. Extensive susceptibility artifact noted throughout the upper abdomen in the region of the gastroesophageal junction, right upper retroperitoneum (related to prior right adrenalectomy), and adjacent to the stomach, related to clips from prior surgeries.  IMPRESSION: 1. Very mild intra and extrahepatic biliary ductal dilatation, as above. This appears very similar to the prior CT scan 05/04/2013, although today's study clearly demonstrates that the perceived ductal enlargement in the porta hepatis was actually a combination of the adjacent common hepatic duct and cystic duct remnants rather than one large common bile duct as it appeared to be on the prior CT scan. The common hepatic duct, cystic duct remnant and common bile duct are all mildly dilated measuring up to 9 mm, and there is very mild intrahepatic biliary ductal dilatation. Although much of this could be attributable to functional  dilatation in this post cholecystectomy patient, the common bile duct does abruptly taper immediately before the level of the ampulla such that a distal CBD stricture is not excluded. If there is clinical concern for biliary obstruction, further evaluation with ERCP may be warranted. 2. 1.8 x 1.5 cm left adrenal adenoma. 3. Mild hepatic steatosis. 4. Additional incidental findings, as above.   Electronically Signed   By: DVinnie LangtonM.D.   On: 06/03/2013 10:02  Mr Abd W/wo Cm/mrcp  06/03/2013   CLINICAL DATA:  Biliary tract dilatation.  EXAM: MRI ABDOMEN WITHOUT AND WITH CONTRAST (INCLUDING MRCP)  TECHNIQUE: Multiplanar multisequence MR imaging of the abdomen was performed both before and after the administration of intravenous contrast. Heavily T2-weighted images of the biliary and pancreatic ducts were obtained, and three-dimensional MRCP images were rendered by post processing.  CONTRAST:  19m MULTIHANCE GADOBENATE DIMEGLUMINE 529 MG/ML IV SOLN  COMPARISON:  CT of the abdomen and pelvis 05/04/2013.  FINDINGS: MRCP images are slightly suboptimal related to gross patient motion. With these limitations in mind, these images demonstrate minimal intrahepatic biliary ductal dilatation. The common hepatic duct measures up to 9 mm. The cystic duct remnant is also slightly prominent measuring up to 9 mm. The common bile duct also measures 9 mm. The distal common bile duct abruptly tapers immediately before the level of the ampulla. No filling defects within the duct  1.8 x 1.5 cm left adrenal nodule demonstrates profound loss of signal intensity throughout the nodule on out of phase dual echo images, compatible with high intracellular lipid in the setting of an adenoma.  Status post cholecystectomy. Mild diffuse loss of signal intensity throughout the hepatic parenchyma on out of phase dual echo images, indicative of very mild hepatic steatosis. No focal hepatic lesions are noted. The appearance of the pancreas,  spleen and bilateral kidneys is unremarkable. Specifically, no pancreatic ductal dilatation. Extensive susceptibility artifact noted throughout the upper abdomen in the region of the gastroesophageal junction, right upper retroperitoneum (related to prior right adrenalectomy), and adjacent to the stomach, related to clips from prior surgeries.  IMPRESSION: 1. Very mild intra and extrahepatic biliary ductal dilatation, as above. This appears very similar to the prior CT scan 05/04/2013, although today's study clearly demonstrates that the perceived ductal enlargement in the porta hepatis was actually a combination of the adjacent common hepatic duct and cystic duct remnants rather than one large common bile duct as it appeared to be on the prior CT scan. The common hepatic duct, cystic duct remnant and common bile duct are all mildly dilated measuring up to 9 mm, and there is very mild intrahepatic biliary ductal dilatation. Although much of this could be attributable to functional dilatation in this post cholecystectomy patient, the common bile duct does abruptly taper immediately before the level of the ampulla such that a distal CBD stricture is not excluded. If there is clinical concern for biliary obstruction, further evaluation with ERCP may be warranted. 2. 1.8 x 1.5 cm left adrenal adenoma. 3. Mild hepatic steatosis. 4. Additional incidental findings, as above.   Electronically Signed   By: DVinnie LangtonM.D.   On: 06/03/2013 10:02    CBC  Recent Labs Lab 06/26/13 2145 06/27/13 0632  WBC 5.7 5.8  HGB 8.5* 9.3*  HCT 24.4* 26.4*  PLT 138* 155  MCV 86.2 86.3  MCH 30.0 30.4  MCHC 34.8 35.2  RDW 14.3 14.4    Chemistries   Recent Labs Lab 06/26/13 2145 06/27/13 0632 06/28/13 0846  NA 144 142  --   K 3.4* 3.6* 3.9  CL 116* 110  --   CO2 18* 21  --   GLUCOSE 82 90  --   BUN <3* <3*  --   CREATININE 0.46* 0.50  --   CALCIUM 6.7* 8.5  --   AST 34  --   --   ALT 18  --   --    ALKPHOS 53  --   --  BILITOT <0.2*  --   --    ------------------------------------------------------------------------------------------------------------------ CrCl is unknown because both a height and weight (above a minimum accepted value) are required for this calculation. ------------------------------------------------------------------------------------------------------------------ No results found for this basename: HGBA1C,  in the last 72 hours ------------------------------------------------------------------------------------------------------------------ No results found for this basename: CHOL, HDL, LDLCALC, TRIG, CHOLHDL, LDLDIRECT,  in the last 72 hours ------------------------------------------------------------------------------------------------------------------ No results found for this basename: TSH, T4TOTAL, FREET3, T3FREE, THYROIDAB,  in the last 72 hours ------------------------------------------------------------------------------------------------------------------ No results found for this basename: VITAMINB12, FOLATE, FERRITIN, TIBC, IRON, RETICCTPCT,  in the last 72 hours  Coagulation profile No results found for this basename: INR, PROTIME,  in the last 168 hours  No results found for this basename: DDIMER,  in the last 72 hours  Cardiac Enzymes No results found for this basename: CK, CKMB, TROPONINI, MYOGLOBIN,  in the last 168 hours ------------------------------------------------------------------------------------------------------------------ No components found with this basename: POCBNP,     Geradine Girt D.O. on 07/02/2013 at 9:19 AM  Between 7am to 7pm - Pager - [phone]  After 7pm go to [email or web address] - password [email or web address]  And look for the night coverage person covering for me after hours  [facility]

## 2013-07-02 NOTE — Consult Note (Signed)
Psychiatry Follow Up Consult   Assessment: AXIS I:  Bereavement and Major Depression, Recurrent severe AXIS II:  Deferred AXIS III:   Past Medical History  Diagnosis Date  . Lupus   . 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 Hypertension    AXIS IV:  other psychosocial or environmental problems AXIS V:  41-50 serious symptoms  Plan:  Recommend psychiatric Inpatient admission when medically cleared.  Subjective:   Angel Price is a 48 y.o. female patient admitted after she cut her wrist  Patient seen chart reviewed.  Patient remains very anxious and emotional.  She is very upset because no psychiatrist beds available .  She appears tearful but also very labile.  She sleeping on and off.  She continued to minimize the symptoms and express passive suicidal thoughts.  She gets easily irritable.  She told that she used to take Lexapro and Abilify which helped her mood depression.  She has been off the medication for long time.  She is willing to restart medication.  She continues to have feelings of hopelessness worthlessness but denies any hallucination or any paranoia.  She denies any suicidal plan however she was admitted after cutting her wrist.  She still misses her son.  She is hoping that family support and help her depression.  Past Psychiatric History: Past Medical History  Diagnosis Date  . Lupus   . 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 Hypertension    reports that she has been smoking Cigarettes.  She has a 35 pack-year smoking history. She has never used smokeless tobacco. She reports that she drinks alcohol. She reports that she does not use illicit drugs. Family History  Problem Relation Age of Onset  . Brain cancer Son   . Breast cancer Maternal Aunt   . Colon cancer Maternal Grandmother     late 45s, early 75s  . Lung cancer Father   . Liver disease Neg Hx            Allergies:   Allergies  Allergen Reactions  . Codeine Hives  .  Morphine And Related     gastritis     Objective: Blood pressure 139/91, pulse 108, temperature 98 F (36.7 C), temperature source Oral, resp. rate 18, SpO2 100.00%.There is no weight on file to calculate BMI. Results for orders placed during the hospital encounter of 06/24/13 (from the past 72 hour(s))  WOUND CULTURE     Status: None   Collection Time    07/01/13  5:34 PM      Result Value Ref Range   Specimen Description WOUND ABDOMEN     Special Requests NONE     Gram Stain       Value: NO WBC SEEN     NO SQUAMOUS EPITHELIAL CELLS SEEN     NO ORGANISMS SEEN     Performed at [facility]   Culture       Value: NO GROWTH     Performed at [facility]   Report Status PENDING       Current Facility-Administered Medications  Medication Dose Route Frequency Provider Last Rate Last Dose  . benzonatate (TESSALON) capsule 100 mg  100 mg Oral BID PRN Maryann Mikhail, DO      . diphenhydrAMINE (BENADRYL) capsule 25-50 mg  25-50 mg Oral Q6H PRN Jeryl Columbia, NP   50 mg at 07/01/13 2137  . docusate sodium (COLACE) capsule 100 mg  100 mg Oral BID Cristal Ford, DO   100 mg at 07/01/13 2137  . doxycycline (VIBRA-TABS) tablet 100 mg  100 mg Oral Q12H Truman Hayward, MD   100 mg at 07/02/13 1226  . fluticasone (FLONASE) 50 MCG/ACT nasal spray 1 spray  1 spray Each Nare Daily Maryann Mikhail, DO   1 spray at 07/01/13 1112  . gi cocktail (Maalox,Lidocaine,Donnatal)  30 mL Oral TID PRN Geradine Girt, DO      . HYDROcodone-acetaminophen (NORCO/VICODIN) 5-325 MG per tablet 1-2 tablet  1-2 tablet Oral Q4H PRN Theodis Blaze, MD   2 tablet at 06/30/13 1026  . lactulose (CHRONULAC) 10 GM/15ML solution 20 g  20 g Oral BID Geradine Girt, DO      . ondansetron (ZOFRAN) tablet 4 mg  4 mg Oral Q6H PRN Theodis Blaze, MD   4 mg at 06/29/13 1844  . polyethylene glycol (MIRALAX / GLYCOLAX) packet 17 g  17 g Oral Daily Maryann Mikhail, DO   17 g at 06/30/13 1024  . traMADol  (ULTRAM) tablet 50 mg  50 mg Oral Q6H PRN Geradine Girt, DO   50 mg at 07/01/13 1406    Psychiatric Specialty Exam:     Blood pressure 139/91, pulse 108, temperature 98 F (36.7 C), temperature source Oral, resp. rate 18, SpO2 100.00%.There is no weight on file to calculate BMI.  General Appearance: Casual, guarded and irritable   Eye Contact::  Fair  Speech:  Clear and Coherent  Volume:  Decreased  Mood:  Anxious and Depressed  Affect:  Depressed, tearful  Thought Process:  Coherent and Goal Directed  Orientation:  Full (Time, Place, and Person)  Thought Content:  events, symtpoms, worries, concerne  Suicidal Thoughts:  Denies (tends to minimize and deny)  Homicidal Thoughts:  No  Memory:  Immediate;   Fair Recent;   Fair Remote;   Fair  Judgement:  Fair  Insight:  Shallow  Psychomotor Activity:  Decreased  Concentration:  Fair  Recall:  [employer] of Knowledge:NA  Language: Fair  Akathisia:  No  Handed:    AIMS (if indicated):     Assets:  Desire for Improvement Housing Social Support  Sleep:      Musculoskeletal: Strength & Muscle Tone: unable to assess Gait & Station: in bed Patient leans: N/A  Treatment Plan Summary: The patient remains risk to herself.  Start Lexapro 10 mg Abilify 5 mg at bedtime if not medically contraindicated.  Patient is still waiting for inpatient psychiatric services however there is no bed available.  Continue sitter for patient's safety.  We will sign off however if you have any further questions please call [phone].   Angel Price Angel Price 07/02/2013 5:49 PM

## 2013-07-03 ENCOUNTER — Encounter (HOSPITAL_COMMUNITY): Payer: Self-pay | Admitting: Psychiatry

## 2013-07-03 DIAGNOSIS — S61509A Unspecified open wound of unspecified wrist, initial encounter: Secondary | ICD-10-CM | POA: Diagnosis not present

## 2013-07-03 DIAGNOSIS — F332 Major depressive disorder, recurrent severe without psychotic features: Secondary | ICD-10-CM | POA: Diagnosis not present

## 2013-07-03 DIAGNOSIS — Z634 Disappearance and death of family member: Secondary | ICD-10-CM | POA: Diagnosis not present

## 2013-07-03 DIAGNOSIS — R7989 Other specified abnormal findings of blood chemistry: Secondary | ICD-10-CM | POA: Diagnosis not present

## 2013-07-03 DIAGNOSIS — I10 Essential (primary) hypertension: Secondary | ICD-10-CM | POA: Diagnosis not present

## 2013-07-03 DIAGNOSIS — F319 Bipolar disorder, unspecified: Secondary | ICD-10-CM | POA: Diagnosis not present

## 2013-07-03 DIAGNOSIS — S66909A Unspecified injury of unspecified muscle, fascia and tendon at wrist and hand level, unspecified hand, initial encounter: Secondary | ICD-10-CM | POA: Diagnosis not present

## 2013-07-03 DIAGNOSIS — S31109A Unspecified open wound of abdominal wall, unspecified quadrant without penetration into peritoneal cavity, initial encounter: Secondary | ICD-10-CM | POA: Diagnosis not present

## 2013-07-03 MED ORDER — FLUTICASONE PROPIONATE 50 MCG/ACT NA SUSP: 1.0000 | Freq: Every day | NASAL | Status: DC

## 2013-07-03 MED ORDER — ARIPIPRAZOLE 5 MG PO TABS: 5.0000 mg | ORAL_TABLET | Freq: Every day | ORAL | Status: DC

## 2013-07-03 MED ORDER — ESCITALOPRAM OXALATE 10 MG PO TABS
10.0000 mg | ORAL_TABLET | Freq: Every day | ORAL | Status: DC
Start: 2013-07-03 — End: 2013-07-16

## 2013-07-03 MED ORDER — HYDROCODONE-ACETAMINOPHEN 5-325 MG PO TABS: 1.0000 | ORAL_TABLET | Freq: Four times a day (QID) | ORAL | Status: DC | PRN

## 2013-07-03 MED ORDER — DOXYCYCLINE HYCLATE 100 MG PO TABS: 100.0000 mg | ORAL_TABLET | Freq: Two times a day (BID) | ORAL | Status: DC

## 2013-07-03 MED ORDER — SIMETHICONE 80 MG PO CHEW
80.0000 mg | CHEWABLE_TABLET | Freq: Four times a day (QID) | ORAL | Status: DC | PRN
  Administered 2013-07-03: 80 mg via ORAL
  Filled 2013-07-03: qty 1

## 2013-07-03 NOTE — Progress Notes (Signed)
[facility]                                                                              Patient Demographics  Angel Price, is a 48 y.o. female, DOB - [birth year], UDJ:[id number]  Admit date - 06/24/2013   Admitting Physician Theodis Blaze, MD  Outpatient Primary MD for the patient is Doran Heater, MD  LOS - 9   Chief Complaint  Patient presents with  . Suicide Attempt      Patient wants to go home  Assessment & Plan   Suicide attempt by lacerating right wrist -await inpatient bed-refused by [facility] -patient wants to go home D/c summ done 4/14 by Dr. Kristine Royal and again by me on 4/17 -asked psych to re-eval and see if she is safe to go home- would like for her to be evaled daily -per Network engineer Dr. 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  Consults   Orthopedic surgery Psychiatry  DVT Prophylaxis  SCDs  Lab Results  Component Value Date   PLT 155 06/27/2013    Medications  Scheduled Meds: . ARIPiprazole  5 mg Oral Daily  . docusate sodium  100 mg Oral BID  . doxycycline  100 mg Oral Q12H  . escitalopram  10 mg Oral QHS  . fluticasone  1 spray Each Nare Daily  .  lactulose  20 g Oral BID  . polyethylene glycol  17 g Oral Daily   Continuous Infusions:   PRN Meds:.benzonatate, diphenhydrAMINE, gi cocktail, HYDROcodone-acetaminophen, ondansetron, traMADol  Antibiotics    Anti-infectives   Start     Dose/Rate Route Frequency Ordered Stop   07/02/13 1145  doxycycline (VIBRA-TABS) tablet 100 mg     100 mg Oral Every 12 hours 07/02/13 1131     07/02/13 0000  doxycycline (VIBRA-TABS) 100 MG tablet     100 mg Oral Every 12 hours 07/02/13 1403     06/29/13 0000  sulfamethoxazole-trimethoprim (BACTRIM DS) 800-160 MG per tablet     1 tablet Oral Every 12 hours 06/29/13 0732 07/01/13 2359   06/25/13 0600  ceFAZolin (ANCEF) IVPB 2 g/50 mL premix     2 g 100 mL/hr over 30 Minutes Intravenous On call to O.R. 06/24/13 2243 06/26/13 0559   06/24/13 2245  sulfamethoxazole-trimethoprim (BACTRIM DS) 800-160 MG per tablet 1 tablet     1 tablet Oral Every 12 hours 06/24/13 2243  07/01/13 1112   06/24/13 1515  ceFAZolin (ANCEF) injection 1 g     1 g Intramuscular  Once 06/24/13 1507 06/24/13 1523        Subjective:   Feeling better No pain  Objective:   Filed Vitals:   07/02/13 0519 07/02/13 1353 07/02/13 2120 07/03/13 0609  BP: 101/69 139/91 104/76 102/74  Pulse: 66 108 81 76  Temp: 97.6 F (36.4 C) 98 F (36.7 C) 97.9 F (36.6 C) 98.4 F (36.9 C)  TempSrc: Oral  Oral Oral  Resp: '18 18 18 18  ' SpO2: 98% 100% 99% 96%    Wt Readings from Last 3 Encounters:  06/06/13 59.421 kg (131 lb)  05/14/13 63.957 kg (141 lb)  05/12/13 63.957 kg (141 lb)     Intake/Output Summary (Last 24 hours) at 07/03/13 1000 Last data filed at 07/02/13 2200  Gross per 24 hour  Intake    600 ml  Output      0 ml  Net    600 ml    Exam  General: Well developed, well nourished, NAD, appears stated age  Cardiovascular: S1 S2 auscultated, no rubs, murmurs or gallops. Regular rate and rhythm.  Respiratory: Clear to auscultation bilaterally with equal chest  rise  Abdomen: 2 areas of drainage on abd- minimal amounts  Extremities: warm dry without cyanosis clubbing or edema.   Skin: Without rashes exudates or nodules, multiple tattoos   Psych: Normal affect and demeanor  Data Review   Micro Results Recent Results (from the past 240 hour(s))  WOUND CULTURE     Status: None   Collection Time    07/01/13  5:34 PM      Result Value Ref Range Status   Specimen Description WOUND ABDOMEN   Final   Special Requests NONE   Final   Gram Stain     Final   Value: NO WBC SEEN     NO SQUAMOUS EPITHELIAL CELLS SEEN     NO ORGANISMS SEEN     Performed at [facility]   Culture     Final   Value: NO GROWTH 1 DAY     Performed at [facility]   Report Status PENDING   Incomplete    Radiology Reports Ct Abdomen Pelvis W Contrast  06/06/2013   CLINICAL DATA:  Abdominal pain.  EXAM: CT ABDOMEN AND PELVIS WITH CONTRAST  TECHNIQUE: Multidetector CT imaging of the abdomen and pelvis was performed using the standard protocol following bolus administration of intravenous contrast.  CONTRAST:  159m OMNIPAQUE IOHEXOL 300 MG/ML  SOLN  COMPARISON:  CT of the abdomen and pelvis 05/04/2013.  FINDINGS: Lung Bases: Unremarkable.  Abdomen/Pelvis: Status post cholecystectomy, gastric bypass procedure and hysterectomy. Numerous surgical clips are noted superior and medial to the right kidney, potentially from prior right adrenalectomy. 2 cm left adrenal nodule is incompletely characterized, but unchanged compared to prior examination dating back to 04/18/2013 (and recently characterized as an adenoma on MRI 06/03/2013). Common bile duct appears slightly dilated measuring 9 mm in the porta hepatis, however, this is stable compared to prior studies. No significant intrahepatic biliary ductal dilatation. The appearance of the liver, pancreas, spleen and bilateral kidneys is unremarkable.  No significant volume of ascites. No pneumoperitoneum. No pathologic  distention of small bowel. No definite lymphadenopathy identified within the abdomen or pelvis. Atherosclerosis throughout the abdominal and pelvic vasculature, without evidence of aneurysm. Ovaries are not confidently identified may be surgically absent or atrophic. Urinary bladder is  unremarkable in appearance.  As with prior examinations, there is some amorphous soft tissue density in the anterior abdominal wall around the umbilicus, similar to prior study from 05/04/2013 which is favored to represent sequela of prior wound infection with anterior abdominal wall abscesses or fistulous tracts. Given the stability in size, persistent intra abdominal wall abscesses are unlikely, and this is favored to represent chronic postoperative scar tissue at this time.  Musculoskeletal: There are no aggressive appearing lytic or blastic lesions noted in the visualized portions of the skeleton.  IMPRESSION: 1. No acute findings in the abdomen or pelvis to account for the patient's symptoms. 2. Persistent prominence of abnormal soft tissue in the anterior abdominal wall around the umbilicus, likely to represent resolving postoperative scar tissue. No definite abdominal wall abscess is confidently identified at this time, although the presence of persistent infected tissue with draining sinus tracts is not excluded, and clinical correlation with physical exam is recommended. 3. 2 cm left adrenal adenoma is unchanged. 4. Postoperative changes redemonstrated, as above. 5. Persistent prominence of the extrahepatic bile ducts, similar to prior studies. This is favored to be functional postcholecystectomy bile duct dilatation rather than evidence of obstruction, particularly in correlation with prior MRI 06/03/2013 (see that report for further details).   Electronically Signed   By: Vinnie Langton M.D.   On: 06/06/2013 21:38   Mr 3d Recon At Scanner  06/03/2013   CLINICAL DATA:  Biliary tract dilatation.  EXAM: MRI ABDOMEN  WITHOUT AND WITH CONTRAST (INCLUDING MRCP)  TECHNIQUE: Multiplanar multisequence MR imaging of the abdomen was performed both before and after the administration of intravenous contrast. Heavily T2-weighted images of the biliary and pancreatic ducts were obtained, and three-dimensional MRCP images were rendered by post processing.  CONTRAST:  27m MULTIHANCE GADOBENATE DIMEGLUMINE 529 MG/ML IV SOLN  COMPARISON:  CT of the abdomen and pelvis 05/04/2013.  FINDINGS: MRCP images are slightly suboptimal related to gross patient motion. With these limitations in mind, these images demonstrate minimal intrahepatic biliary ductal dilatation. The common hepatic duct measures up to 9 mm. The cystic duct remnant is also slightly prominent measuring up to 9 mm. The common bile duct also measures 9 mm. The distal common bile duct abruptly tapers immediately before the level of the ampulla. No filling defects within the duct  1.8 x 1.5 cm left adrenal nodule demonstrates profound loss of signal intensity throughout the nodule on out of phase dual echo images, compatible with high intracellular lipid in the setting of an adenoma.  Status post cholecystectomy. Mild diffuse loss of signal intensity throughout the hepatic parenchyma on out of phase dual echo images, indicative of very mild hepatic steatosis. No focal hepatic lesions are noted. The appearance of the pancreas, spleen and bilateral kidneys is unremarkable. Specifically, no pancreatic ductal dilatation. Extensive susceptibility artifact noted throughout the upper abdomen in the region of the gastroesophageal junction, right upper retroperitoneum (related to prior right adrenalectomy), and adjacent to the stomach, related to clips from prior surgeries.  IMPRESSION: 1. Very mild intra and extrahepatic biliary ductal dilatation, as above. This appears very similar to the prior CT scan 05/04/2013, although today's study clearly demonstrates that the perceived ductal  enlargement in the porta hepatis was actually a combination of the adjacent common hepatic duct and cystic duct remnants rather than one large common bile duct as it appeared to be on the prior CT scan. The common hepatic duct, cystic duct remnant and common bile duct are all mildly dilated measuring  up to 9 mm, and there is very mild intrahepatic biliary ductal dilatation. Although much of this could be attributable to functional dilatation in this post cholecystectomy patient, the common bile duct does abruptly taper immediately before the level of the ampulla such that a distal CBD stricture is not excluded. If there is clinical concern for biliary obstruction, further evaluation with ERCP may be warranted. 2. 1.8 x 1.5 cm left adrenal adenoma. 3. Mild hepatic steatosis. 4. Additional incidental findings, as above.   Electronically Signed   By: Vinnie Langton M.D.   On: 06/03/2013 10:02   Mr Abd W/wo Cm/mrcp  06/03/2013   CLINICAL DATA:  Biliary tract dilatation.  EXAM: MRI ABDOMEN WITHOUT AND WITH CONTRAST (INCLUDING MRCP)  TECHNIQUE: Multiplanar multisequence MR imaging of the abdomen was performed both before and after the administration of intravenous contrast. Heavily T2-weighted images of the biliary and pancreatic ducts were obtained, and three-dimensional MRCP images were rendered by post processing.  CONTRAST:  30m MULTIHANCE GADOBENATE DIMEGLUMINE 529 MG/ML IV SOLN  COMPARISON:  CT of the abdomen and pelvis 05/04/2013.  FINDINGS: MRCP images are slightly suboptimal related to gross patient motion. With these limitations in mind, these images demonstrate minimal intrahepatic biliary ductal dilatation. The common hepatic duct measures up to 9 mm. The cystic duct remnant is also slightly prominent measuring up to 9 mm. The common bile duct also measures 9 mm. The distal common bile duct abruptly tapers immediately before the level of the ampulla. No filling defects within the duct  1.8 x 1.5 cm left  adrenal nodule demonstrates profound loss of signal intensity throughout the nodule on out of phase dual echo images, compatible with high intracellular lipid in the setting of an adenoma.  Status post cholecystectomy. Mild diffuse loss of signal intensity throughout the hepatic parenchyma on out of phase dual echo images, indicative of very mild hepatic steatosis. No focal hepatic lesions are noted. The appearance of the pancreas, spleen and bilateral kidneys is unremarkable. Specifically, no pancreatic ductal dilatation. Extensive susceptibility artifact noted throughout the upper abdomen in the region of the gastroesophageal junction, right upper retroperitoneum (related to prior right adrenalectomy), and adjacent to the stomach, related to clips from prior surgeries.  IMPRESSION: 1. Very mild intra and extrahepatic biliary ductal dilatation, as above. This appears very similar to the prior CT scan 05/04/2013, although today's study clearly demonstrates that the perceived ductal enlargement in the porta hepatis was actually a combination of the adjacent common hepatic duct and cystic duct remnants rather than one large common bile duct as it appeared to be on the prior CT scan. The common hepatic duct, cystic duct remnant and common bile duct are all mildly dilated measuring up to 9 mm, and there is very mild intrahepatic biliary ductal dilatation. Although much of this could be attributable to functional dilatation in this post cholecystectomy patient, the common bile duct does abruptly taper immediately before the level of the ampulla such that a distal CBD stricture is not excluded. If there is clinical concern for biliary obstruction, further evaluation with ERCP may be warranted. 2. 1.8 x 1.5 cm left adrenal adenoma. 3. Mild hepatic steatosis. 4. Additional incidental findings, as above.   Electronically Signed   By: DVinnie LangtonM.D.   On: 06/03/2013 10:02    CBC  Recent Labs Lab 06/26/13 2145  06/27/13 0632  WBC 5.7 5.8  HGB 8.5* 9.3*  HCT 24.4* 26.4*  PLT 138* 155  MCV 86.2 86.3  MCH  30.0 30.4  MCHC 34.8 35.2  RDW 14.3 14.4    Chemistries   Recent Labs Lab 06/26/13 2145 06/27/13 0632 06/28/13 0846  NA 144 142  --   K 3.4* 3.6* 3.9  CL 116* 110  --   CO2 18* 21  --   GLUCOSE 82 90  --   BUN <3* <3*  --   CREATININE 0.46* 0.50  --   CALCIUM 6.7* 8.5  --   AST 34  --   --   ALT 18  --   --   ALKPHOS 53  --   --   BILITOT <0.2*  --   --    ------------------------------------------------------------------------------------------------------------------ CrCl is unknown because both a height and weight (above a minimum accepted value) are required for this calculation. ------------------------------------------------------------------------------------------------------------------ No results found for this basename: HGBA1C,  in the last 72 hours ------------------------------------------------------------------------------------------------------------------ No results found for this basename: CHOL, HDL, LDLCALC, TRIG, CHOLHDL, LDLDIRECT,  in the last 72 hours ------------------------------------------------------------------------------------------------------------------ No results found for this basename: TSH, T4TOTAL, FREET3, T3FREE, THYROIDAB,  in the last 72 hours ------------------------------------------------------------------------------------------------------------------ No results found for this basename: VITAMINB12, FOLATE, FERRITIN, TIBC, IRON, RETICCTPCT,  in the last 72 hours  Coagulation profile No results found for this basename: INR, PROTIME,  in the last 168 hours  No results found for this basename: DDIMER,  in the last 72 hours  Cardiac Enzymes No results found for this basename: CK, CKMB, TROPONINI, MYOGLOBIN,  in the last 168  hours ------------------------------------------------------------------------------------------------------------------ No components found with this basename: POCBNP,     Geradine Girt D.O. on 07/03/2013 at 10:00 AM  Between 7am to 7pm - Pager - [phone]  After 7pm go to [email or web address] - password [email or web address]  And look for the night coverage person covering for me after hours  [facility]

## 2013-07-03 NOTE — Progress Notes (Signed)
9 Days Post-Op  Subjective: No issues overnight  Objective: Vital signs in last 24 hours: Temp:  [97.9 F (36.6 C)-98.4 F (36.9 C)] 98.4 F (36.9 C) (04/18 0609) Pulse Rate:  [76-108] 76 (04/18 0609) Resp:  [18] 18 (04/18 0609) BP: (102-139)/(74-91) 102/74 mmHg (04/18 0609) SpO2:  [96 %-100 %] 96 % (04/18 0609) Last BM Date: 07/02/13  Intake/Output from previous day: 04/17 0701 - 04/18 0700 In: 840 [P.O.:840] Out: -  Intake/Output this shift:    NAD, pt bathing. Nursing tech is chaperone Soft, nd, old scars. 2 pinpoint sinuses; no fluctuance, induration, cellulitis  Lab Results:  No results found for this basename: WBC, HGB, HCT, PLT,  in the last 72 hours BMET No results found for this basename: NA, K, CL, CO2, GLUCOSE, BUN, CREATININE, CALCIUM,  in the last 72 hours PT/INR No results found for this basename: LABPROT, INR,  in the last 72 hours ABG No results found for this basename: PHART, PCO2, PO2, HCO3,  in the last 72 hours  Studies/Results: No results found.  Anti-infectives: Anti-infectives   Start     Dose/Rate Route Frequency Ordered Stop   07/02/13 1145  doxycycline (VIBRA-TABS) tablet 100 mg     100 mg Oral Every 12 hours 07/02/13 1131     07/02/13 0000  doxycycline (VIBRA-TABS) 100 MG tablet     100 mg Oral Every 12 hours 07/02/13 1403     06/29/13 0000  sulfamethoxazole-trimethoprim (BACTRIM DS) 800-160 MG per tablet     1 tablet Oral Every 12 hours 06/29/13 0732 07/01/13 2359   06/25/13 0600  ceFAZolin (ANCEF) IVPB 2 g/50 mL premix     2 g 100 mL/hr over 30 Minutes Intravenous On call to O.R. 06/24/13 2243 06/26/13 0559   06/24/13 2245  sulfamethoxazole-trimethoprim (BACTRIM DS) 800-160 MG per tablet 1 tablet     1 tablet Oral Every 12 hours 06/24/13 2243 07/01/13 1112   06/24/13 1515  ceFAZolin (ANCEF) injection 1 g     1 g Intramuscular  Once 06/24/13 1507 06/24/13 1523      Assessment/Plan: s/p Procedure(s): exploration of traumatic wound  right wrist (Right)  Chronic abd wound - no need for intervention -dry gauze -pt can f/u with Dr Arnoldo Morale -signing off  Leighton Ruff. Redmond Pulling, MD, FACS General, Bariatric, & Minimally Invasive Surgery [facility], [place]   LOS: 9 days    Gayland Curry 07/03/2013
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
## 2013-07-03 NOTE — Consult Note (Signed)
Angel Price   Reason for Price: Patient had been suicidal on admission Referring Physician: Dr. Jones Bales Angel Price is an 48 y.o. female. Total Time spent with patient: 30 minutes  Assessment: AXIS I:  Bereavement and Major Depression, Recurrent severe AXIS II:  Deferred AXIS III:   Past Medical History  Diagnosis Date  . Lupus   . 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 Hypertension    AXIS IV:  other psychosocial or environmental problems and problems with primary support group AXIS V:  61-70 mild symptoms  Plan:  No evidence of imminent risk to self or others at present.   Patient does not meet criteria for psychiatric inpatient admission. Supportive therapy provided about ongoing stressors. Discussed crisis plan, support from social network, calling 911, coming to the Emergency Department, and calling Suicide Hotline.  Subjective:   Angel Price is a 48 y.o. female patient admitted with suicidal ideation and attempt to hurt herself by cutting her wrist.  HPI:  This patient is a 48 year old separated white female lives with her parents in [place]. She is currently on disability.  The patient states that she had a history of depression dating back to a number of years ago. She also admits that she is to cut herself as a teenager. She has not had any psychiatric treatment for many years.  The patient cut her wrist because she was very upset about her son's death back in 2023-01-05 from brain tumor. She was mad at herself for "not saying goodbye". She claims that she couldn't bring herself to go to the grave site. On the day that she hurt her so she heard a song on the radio that her son used to like this made her very upset. She also that she drank 3 beers prior to doing the cutting.  The patient states that since she's been here she stopped various staff members about her feelings and is really helped. That has not opened up in  psychiatry so she's not had an opportunity to go through treatment there. However at this point she denies being suicidal or having any thoughts of harming herself or others. She is agreeable to coming to my office for therapy and counseling as well as medication management. She denies auditory or visual hallucinations paranoia or any other psychotic symptoms. HPI Elements:   Location:  Generalized. Quality:  Global, moderate severity.  Past Psychiatric History: Past Medical History  Diagnosis Date  . Lupus   . 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 Hypertension     reports that she has been smoking Cigarettes.  She has a 35 pack-year smoking history. She has never used smokeless tobacco. She reports that she drinks alcohol. She reports that she does not use illicit drugs. Family History  Problem Relation Age of Onset  . Brain cancer Son   . Breast cancer Maternal Aunt   . Colon cancer Maternal Grandmother     late 72s, early 81s  . Lung cancer Father   . Liver disease Neg Hx          Abuse/Neglect [facility]) Physical Abuse: Denies Verbal Abuse: Denies Sexual Abuse: Denies Allergies:   Allergies  Allergen Reactions  . Codeine Hives  . Morphine And Related     gastritis    ACT Assessment Complete:  Yes:    Educational Status    Risk to Self: Risk to self Is patient at risk for suicide?: Yes  Substance abuse history and/or treatment for substance abuse?: Yes (drinks and smokes)  Risk to Others:    Abuse: Abuse/Neglect Assessment (Assessment to be complete while patient is alone) Physical Abuse: Denies Verbal Abuse: Denies Sexual Abuse: Denies Exploitation of patient/patient's resources: Denies Self-Neglect: Denies  Prior Inpatient Therapy:    Prior Outpatient Therapy:    Additional Information:                    Objective: Blood pressure 116/81, pulse 87, temperature 98.2 F (36.8 C), temperature source Oral, resp. rate 20, height 5\' 3"  (1.6  m), weight 131 lb (59.421 kg), SpO2 100.00%.Body mass index is 23.21 kg/(m^2). Results for orders placed during the hospital encounter of 06/24/13 (from the past 72 hour(s))  WOUND CULTURE     Status: None   Collection Time    07/01/13  5:34 PM      Result Value Ref Range   Specimen Description WOUND ABDOMEN     Special Requests NONE     Gram Stain       Value: NO WBC SEEN     NO SQUAMOUS EPITHELIAL CELLS SEEN     NO ORGANISMS SEEN     Performed at [facility]   Culture       Value: NO GROWTH 1 DAY     Performed at [place] are reviewed and are pertinent for no significant abnormality  Current Facility-Administered Medications  Medication Dose Route Frequency Provider Last Rate Last Dose  . ARIPiprazole (ABILIFY) tablet 5 mg  5 mg Oral Daily Geradine Girt, DO   5 mg at 07/03/13 0941  . benzonatate (TESSALON) capsule 100 mg  100 mg Oral BID PRN Maryann Mikhail, DO      . diphenhydrAMINE (BENADRYL) capsule 25-50 mg  25-50 mg Oral Q6H PRN Jeryl Columbia, NP   50 mg at 07/02/13 2059  . docusate sodium (COLACE) capsule 100 mg  100 mg Oral BID Maryann Mikhail, DO   100 mg at 07/01/13 2137  . doxycycline (VIBRA-TABS) tablet 100 mg  100 mg Oral Q12H Truman Hayward, MD   100 mg at 07/03/13 0941  . escitalopram (LEXAPRO) tablet 10 mg  10 mg Oral QHS Geradine Girt, DO   10 mg at 07/02/13 2058  . fluticasone (FLONASE) 50 MCG/ACT nasal spray 1 spray  1 spray Each Nare Daily Maryann Mikhail, DO   1 spray at 07/01/13 1112  . gi cocktail (Maalox,Lidocaine,Donnatal)  30 mL Oral TID PRN Geradine Girt, DO      . HYDROcodone-acetaminophen (NORCO/VICODIN) 5-325 MG per tablet 1-2 tablet  1-2 tablet Oral Q4H PRN Theodis Blaze, MD   2 tablet at 06/30/13 1026  . lactulose (CHRONULAC) 10 GM/15ML solution 20 g  20 g Oral BID Geradine Girt, DO      . ondansetron (ZOFRAN) tablet 4 mg  4 mg Oral Q6H PRN Theodis Blaze, MD   4 mg at 06/29/13 1844  .  polyethylene glycol (MIRALAX / GLYCOLAX) packet 17 g  17 g Oral Daily Maryann Mikhail, DO   17 g at 06/30/13 1024  . simethicone (MYLICON) chewable tablet 80 mg  80 mg Oral Q6H PRN Geradine Girt, DO   80 mg at 07/03/13 1557  . traMADol (ULTRAM) tablet 50 mg  50 mg Oral Q6H PRN Geradine Girt, DO   50 mg at 07/03/13 [phone]  Psychiatric Specialty Exam: Physical Exam  Constitutional: She is oriented to person, place, and time. She appears well-developed and well-nourished.  HENT:  Head: Normocephalic and atraumatic.  Eyes: Pupils are equal, round, and reactive to light.  Neck: Normal range of motion. Neck supple.  Respiratory: Effort normal.  Musculoskeletal: Normal range of motion.  Neurological: She is alert and oriented to person, place, and time.  Skin: Skin is warm and dry.  Psychiatric: She has a normal mood and affect. Her behavior is normal. Judgment and thought content normal.    Review of Systems  Constitutional: Negative.   HENT: Negative.   Eyes: Negative.   Cardiovascular: Negative.   Gastrointestinal: Positive for abdominal pain.  Genitourinary: Negative.   Musculoskeletal: Negative.   Skin: Negative.   Neurological: Negative.   Endo/Heme/Allergies: Negative.   Psychiatric/Behavioral: Negative for depression, suicidal ideas, hallucinations and memory loss. The patient is not nervous/anxious and does not have insomnia.                     Psychiatry Follow Up Price   Assessment: AXIS I:  Bereavement and Major Depression, Recurrent severe AXIS II:  Deferred AXIS III:   Past Medical History  Diagnosis Date  . Lupus   . 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 Hypertension    AXIS IV:  other psychosocial or environmental problems AXIS V:  41-50 serious symptoms  Plan:  Recommend psychiatric Inpatient admission when medically cleared.  Subjective:   Angel Price is a 48 y.o. female patient admitted after she cut her wrist  Patient seen  chart reviewed.  Patient remains very anxious and emotional.  She is very upset because no psychiatrist beds available .  She appears tearful but also very labile.  She sleeping on and off.  She continued to minimize the symptoms and express passive suicidal thoughts.  She gets easily irritable.  She told that she used to take Lexapro and Abilify which helped her mood depression.  She has been off the medication for long time.  She is willing to restart medication.  She continues to have feelings of hopelessness worthlessness but denies any hallucination or any paranoia.  She denies any suicidal plan however she was admitted after cutting her wrist.  She still misses her son.  She is hoping that family support and help her depression.  Past Psychiatric History: Past Medical History  Diagnosis Date  . Lupus   . 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 Hypertension    reports that she has been smoking Cigarettes.  She has a 35 pack-year smoking history. She has never used smokeless tobacco. She reports that she drinks alcohol. She reports that she does not use illicit drugs. Family History  Problem Relation Age of Onset  . Brain cancer Son   . Breast cancer Maternal Aunt   . Colon cancer Maternal Grandmother     late 40s, early 52s  . Lung cancer Father   . Liver disease Neg Hx          Abuse/Neglect [facility]) Physical Abuse: Denies Verbal Abuse: Denies Sexual Abuse: Denies Allergies:   Allergies  Allergen Reactions  . Codeine Hives  . Morphine And Related     gastritis     Objective: Blood pressure 116/81, pulse 87, temperature 98.2 F (36.8 C), temperature source Oral, resp. rate 20, height 5\' 3"  (1.6 m), weight 131 lb (59.421 kg), SpO2 100.00%.Body mass index is 23.21 kg/(m^2).  Results for orders placed during the hospital encounter of 06/24/13 (from the past 72 hour(s))  WOUND CULTURE     Status: None   Collection Time    07/01/13  5:34 PM      Result Value Ref Range    Specimen Description WOUND ABDOMEN     Special Requests NONE     Gram Stain       Value: NO WBC SEEN     NO SQUAMOUS EPITHELIAL CELLS SEEN     NO ORGANISMS SEEN     Performed at [facility]   Culture       Value: NO GROWTH 1 DAY     Performed at [facility]   Report Status PENDING       Current Facility-Administered Medications  Medication Dose Route Frequency Provider Last Rate Last Dose  . ARIPiprazole (ABILIFY) tablet 5 mg  5 mg Oral Daily Geradine Girt, DO   5 mg at 07/03/13 0941  . benzonatate (TESSALON) capsule 100 mg  100 mg Oral BID PRN Maryann Mikhail, DO      . diphenhydrAMINE (BENADRYL) capsule 25-50 mg  25-50 mg Oral Q6H PRN Jeryl Columbia, NP   50 mg at 07/02/13 2059  . docusate sodium (COLACE) capsule 100 mg  100 mg Oral BID Maryann Mikhail, DO   100 mg at 07/01/13 2137  . doxycycline (VIBRA-TABS) tablet 100 mg  100 mg Oral Q12H Truman Hayward, MD   100 mg at 07/03/13 0941  . escitalopram (LEXAPRO) tablet 10 mg  10 mg Oral QHS Geradine Girt, DO   10 mg at 07/02/13 2058  . fluticasone (FLONASE) 50 MCG/ACT nasal spray 1 spray  1 spray Each Nare Daily Maryann Mikhail, DO   1 spray at 07/01/13 1112  . gi cocktail (Maalox,Lidocaine,Donnatal)  30 mL Oral TID PRN Geradine Girt, DO      . HYDROcodone-acetaminophen (NORCO/VICODIN) 5-325 MG per tablet 1-2 tablet  1-2 tablet Oral Q4H PRN Theodis Blaze, MD   2 tablet at 06/30/13 1026  . lactulose (CHRONULAC) 10 GM/15ML solution 20 g  20 g Oral BID Geradine Girt, DO      . ondansetron (ZOFRAN) tablet 4 mg  4 mg Oral Q6H PRN Theodis Blaze, MD   4 mg at 06/29/13 1844  . polyethylene glycol (MIRALAX / GLYCOLAX) packet 17 g  17 g Oral Daily Maryann Mikhail, DO   17 g at 06/30/13 1024  . simethicone (MYLICON) chewable tablet 80 mg  80 mg Oral Q6H PRN Geradine Girt, DO   80 mg at 07/03/13 1557  . traMADol (ULTRAM) tablet 50 mg  50 mg Oral Q6H PRN Geradine Girt, DO   50 mg at 07/03/13 0807    Psychiatric  Specialty Exam:     Blood pressure 116/81, pulse 87, temperature 98.2 F (36.8 C), temperature source Oral, resp. rate 20, height 5\' 3"  (1.6 m), weight 131 lb (59.421 kg), SpO2 100.00%.Body mass index is 23.21 kg/(m^2).  General Appearance: Casual, grooming is good   Eye Contact::  Fair  Speech:  Clear and Coherent  Volume: Normal   Mood:  Good   Affect: Bright and hopeful   Thought Process:  Coherent and Goal Directed  Orientation:  Full (Time, Place, and Person)  Thought Content:  Within normal limits   Suicidal Thoughts:  Denies  Homicidal Thoughts:  No  Memory:  Immediate;   Fair Recent;   Fair Remote;  Fair  Judgement:  Fair  Insight:  Improved   Psychomotor Activity: Normal   Concentration:  Fair  Recall:  [place]  Language: Fair  Akathisia:  No  Handed:    AIMS (if indicated):     Assets:  Desire for Improvement Housing Social Support  Sleep:      Musculoskeletal: Strength & Muscle Tone: unable to assess Gait & Stationormal Patient leans: N/A  Treatment Plan Summary: Patient's affect is very bright today. She feels like she's gotten tremendous help here in the hospital. She really denies any thoughts of self-harm. She agrees to follow up in my office in [place]. Phone [phone] for counseling and medication management. Suggest she continue the Lexapro and Abilify until she can be seen as an outpatient.  Levonne Spiller 07/03/2013 5:05 PM

## 2013-07-06 LAB — WOUND CULTURE: Gram Stain: NONE SEEN

## 2013-07-16 ENCOUNTER — Encounter (HOSPITAL_COMMUNITY): Payer: Self-pay | Admitting: Psychiatry

## 2013-07-16 ENCOUNTER — Ambulatory Visit (INDEPENDENT_AMBULATORY_CARE_PROVIDER_SITE_OTHER): Payer: Medicare Other | Admitting: Psychiatry

## 2013-07-16 VITALS — BP 120/80 | Ht 63.0 in | Wt 127.0 lb

## 2013-07-16 DIAGNOSIS — F1010 Alcohol abuse, uncomplicated: Secondary | ICD-10-CM

## 2013-07-16 DIAGNOSIS — F259 Schizoaffective disorder, unspecified: Secondary | ICD-10-CM

## 2013-07-16 DIAGNOSIS — F329 Major depressive disorder, single episode, unspecified: Secondary | ICD-10-CM

## 2013-07-16 DIAGNOSIS — F32A Depression, unspecified: Secondary | ICD-10-CM

## 2013-07-16 MED ORDER — ESCITALOPRAM OXALATE 20 MG PO TABS: 20.0000 mg | ORAL_TABLET | Freq: Every day | ORAL | Status: DC

## 2013-07-16 MED ORDER — ZOLPIDEM TARTRATE ER 12.5 MG PO TBCR: 12.5000 mg | EXTENDED_RELEASE_TABLET | Freq: Every evening | ORAL | Status: DC | PRN

## 2013-07-16 MED ORDER — ARIPIPRAZOLE 5 MG PO TABS: 5.0000 mg | ORAL_TABLET | Freq: Two times a day (BID) | ORAL | Status: DC
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
## 2013-07-18 ENCOUNTER — Encounter (HOSPITAL_COMMUNITY): Payer: Self-pay | Admitting: Emergency Medicine

## 2013-07-18 ENCOUNTER — Emergency Department (HOSPITAL_COMMUNITY)
Admission: EM | Admit: 2013-07-18 | Discharge: 2013-07-18 | Disposition: A | Discharge status: Home / Self Care | Payer: Medicare Other | Attending: Emergency Medicine | Admitting: Emergency Medicine

## 2013-07-18 DIAGNOSIS — R4182 Altered mental status, unspecified: Secondary | ICD-10-CM | POA: Insufficient documentation

## 2013-07-18 DIAGNOSIS — Z90710 Acquired absence of both cervix and uterus: Secondary | ICD-10-CM | POA: Insufficient documentation

## 2013-07-18 DIAGNOSIS — Z9889 Other specified postprocedural states: Secondary | ICD-10-CM | POA: Insufficient documentation

## 2013-07-18 DIAGNOSIS — R112 Nausea with vomiting, unspecified: Secondary | ICD-10-CM | POA: Insufficient documentation

## 2013-07-18 DIAGNOSIS — R5381 Other malaise: Secondary | ICD-10-CM | POA: Diagnosis not present

## 2013-07-18 DIAGNOSIS — Z8739 Personal history of other diseases of the musculoskeletal system and connective tissue: Secondary | ICD-10-CM | POA: Insufficient documentation

## 2013-07-18 DIAGNOSIS — F1010 Alcohol abuse, uncomplicated: Secondary | ICD-10-CM | POA: Insufficient documentation

## 2013-07-18 DIAGNOSIS — G8929 Other chronic pain: Secondary | ICD-10-CM | POA: Insufficient documentation

## 2013-07-18 DIAGNOSIS — Z9089 Acquired absence of other organs: Secondary | ICD-10-CM | POA: Insufficient documentation

## 2013-07-18 DIAGNOSIS — Z9884 Bariatric surgery status: Secondary | ICD-10-CM | POA: Insufficient documentation

## 2013-07-18 DIAGNOSIS — Y838 Other surgical procedures as the cause of abnormal reaction of the patient, or of later complication, without mention of misadventure at the time of the procedure: Secondary | ICD-10-CM | POA: Insufficient documentation

## 2013-07-18 DIAGNOSIS — R5383 Other fatigue: Secondary | ICD-10-CM | POA: Diagnosis not present

## 2013-07-18 DIAGNOSIS — F17200 Nicotine dependence, unspecified, uncomplicated: Secondary | ICD-10-CM | POA: Insufficient documentation

## 2013-07-18 DIAGNOSIS — R109 Unspecified abdominal pain: Secondary | ICD-10-CM

## 2013-07-18 DIAGNOSIS — Z8639 Personal history of other endocrine, nutritional and metabolic disease: Secondary | ICD-10-CM | POA: Insufficient documentation

## 2013-07-18 DIAGNOSIS — F10929 Alcohol use, unspecified with intoxication, unspecified: Secondary | ICD-10-CM

## 2013-07-18 DIAGNOSIS — Z79899 Other long term (current) drug therapy: Secondary | ICD-10-CM | POA: Insufficient documentation

## 2013-07-18 DIAGNOSIS — I10 Essential (primary) hypertension: Secondary | ICD-10-CM | POA: Insufficient documentation

## 2013-07-18 DIAGNOSIS — Z862 Personal history of diseases of the blood and blood-forming organs and certain disorders involving the immune mechanism: Secondary | ICD-10-CM | POA: Insufficient documentation

## 2013-07-18 DIAGNOSIS — Z792 Long term (current) use of antibiotics: Secondary | ICD-10-CM | POA: Insufficient documentation

## 2013-07-18 DIAGNOSIS — R197 Diarrhea, unspecified: Secondary | ICD-10-CM | POA: Insufficient documentation

## 2013-07-18 DIAGNOSIS — IMO0002 Reserved for concepts with insufficient information to code with codable children: Secondary | ICD-10-CM | POA: Insufficient documentation

## 2013-07-18 LAB — COMPREHENSIVE METABOLIC PANEL
ALBUMIN: 3.7 g/dL (ref 3.5–5.2)
ALT: 21 U/L (ref 0–35)
AST: 35 U/L (ref 0–37)
Alkaline Phosphatase: 78 U/L (ref 39–117)
BILIRUBIN TOTAL: 0.4 mg/dL (ref 0.3–1.2)
BUN: 5 mg/dL — ABNORMAL LOW (ref 6–23)
CALCIUM: 8.8 mg/dL (ref 8.4–10.5)
CO2: 22 meq/L (ref 19–32)
Chloride: 97 mEq/L (ref 96–112)
Creatinine, Ser: 0.5 mg/dL (ref 0.50–1.10)
GFR calc Af Amer: >90 mL/min (ref >90)
Glucose, Bld: 106 mg/dL — ABNORMAL HIGH (ref 70–99)
Potassium: 3.8 mEq/L (ref 3.7–5.3)
Sodium: 135 mEq/L — ABNORMAL LOW (ref 137–147)
Total Protein: 7.2 g/dL (ref 6.0–8.3)

## 2013-07-18 LAB — URINALYSIS, ROUTINE W REFLEX MICROSCOPIC
Bilirubin Urine: NEGATIVE
Glucose, UA: NEGATIVE mg/dL
HGB URINE DIPSTICK: NEGATIVE
Ketones, ur: NEGATIVE mg/dL
LEUKOCYTES UA: NEGATIVE
Nitrite: NEGATIVE
PROTEIN: NEGATIVE mg/dL
SPECIFIC GRAVITY, URINE: 1.015 (ref 1.005–1.030)
UROBILINOGEN UA: 0.2 mg/dL (ref 0.0–1.0)
pH: 5.5 (ref 5.0–8.0)

## 2013-07-18 LAB — CBC WITH DIFFERENTIAL/PLATELET
BASOS ABS: 0 10*3/uL (ref 0.0–0.1)
BASOS PCT: 0 % (ref 0–1)
EOS PCT: 2 % (ref 0–5)
Eosinophils Absolute: 0.1 10*3/uL (ref 0.0–0.7)
HCT: 35.3 % — ABNORMAL LOW (ref 36.0–46.0)
Hemoglobin: 12.8 g/dL (ref 12.0–15.0)
Lymphocytes Relative: 34 % (ref 12–46)
Lymphs Abs: 2.1 10*3/uL (ref 0.7–4.0)
MCH: 30.3 pg (ref 26.0–34.0)
MCHC: 36.3 g/dL — AB (ref 30.0–36.0)
MCV: 83.5 fL (ref 78.0–100.0)
MONO ABS: 0.5 10*3/uL (ref 0.1–1.0)
Monocytes Relative: 9 % (ref 3–12)
Neutro Abs: 3.5 10*3/uL (ref 1.7–7.7)
Neutrophils Relative %: 55 % (ref 43–77)
Platelets: 282 10*3/uL (ref 150–400)
RBC: 4.23 MIL/uL (ref 3.87–5.11)
RDW: 14.9 % (ref 11.5–15.5)
WBC: 6.3 10*3/uL (ref 4.0–10.5)

## 2013-07-18 LAB — SALICYLATE LEVEL

## 2013-07-18 LAB — ETHANOL: ALCOHOL ETHYL (B): 179 mg/dL — AB (ref 0–11)

## 2013-07-18 LAB — RAPID URINE DRUG SCREEN, HOSP PERFORMED
Amphetamines: NOT DETECTED
BARBITURATES: NOT DETECTED
Benzodiazepines: NOT DETECTED
Cocaine: NOT DETECTED
Opiates: POSITIVE — AB
Tetrahydrocannabinol: NOT DETECTED

## 2013-07-18 LAB — ACETAMINOPHEN LEVEL: Acetaminophen (Tylenol), Serum: <15 ug/mL (ref 10–30)

## 2013-07-18 LAB — LIPASE, BLOOD: LIPASE: 32 U/L (ref 11–59)

## 2013-07-18 MED ORDER — ONDANSETRON HCL 4 MG/2ML IJ SOLN
4.0000 mg | Freq: Once | INTRAMUSCULAR | Status: AC
  Administered 2013-07-18: 4 mg via INTRAVENOUS
  Filled 2013-07-18: qty 2

## 2013-07-18 NOTE — ED Notes (Addendum)
Patient complaining of abdominal pain. Reports history of chronic abdominal pain. Family called EMS for altered mental status and reports that patient fell against wall and was very lethargic. Patient reports drank 5 beers tonight and also took an Ambien.

## 2013-07-18 NOTE — ED Provider Notes (Signed)
CSN: [id number]     Arrival date & time 07/18/13  0141 History   First MD Initiated Contact with Patient 07/18/13 0151     Chief Complaint  Patient presents with  . Abdominal Pain     (Consider location/radiation/quality/duration/timing/severity/associated sxs/prior Treatment) HPI Patient has a history of chronic abdominal pain and a chronic abdominal wall wound that is followed closely by Dr. Arnoldo Morale. She is currently on antibiotics for this draining wound. She states that she took her normal medications this evening including her Ambien and pain medication. Family called EMS because the patient was altered and lethargic. Patient has a history of suicide attempts in the past but denies taking extra medication. Patient is unsure why she is in the emergency department. She denies any suicidal ideation this point. She has ongoing nausea and vomiting with diarrhea that is unchanged. She's had no fevers or chills. Past Medical History  Diagnosis Date  . Lupus   . 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 Hypertension    Past Surgical History  Procedure Laterality Date  . Abdominal surgery    . Abdominal hysterectomy  2013    [place]  . Debridement of abdominal wall abscess N/A 02/08/2013    Procedure: DEBRIDEMENT OF ABDOMINAL WALL ABSCESS;  Surgeon: Jamesetta So, MD;  Location: [facility];  Service: General;  Laterality: N/A;  . Gastric bypass       [place], first 2000, 2005/2006.  . Colonoscopy      in [place]  . Cholecystectomy    . Colonoscopy with propofol N/A 05/20/2013    Procedure: COLONOSCOPY WITH PROPOFOL (Procedure #2) At cecum@0957  total withdrawal time=;  Surgeon: Daneil Dolin, MD;  Location: [facility];  Service: Endoscopy;  Laterality: N/A;  . Esophagogastroduodenoscopy (egd) with propofol N/A 05/20/2013    Procedure: ESOPHAGOGASTRODUODENOSCOPY (EGD) WITH PROPOFOL (Procedure #1);  Surgeon: Daneil Dolin, MD;  Location: [facility];  Service: Endoscopy;  Laterality: N/A;  .  Esophageal biopsy  05/20/2013    Procedure: BIOPSIES OF ASCENDING AND SIGMOID COLON;  Surgeon: Daneil Dolin, MD;  Location: [facility];  Service: Endoscopy;;  . Wound exploration Right 06/24/2013    Procedure: exploration of traumatic wound right wrist;  Surgeon: Tennis Must, MD;  Location: [place];  Service: Orthopedics;  Laterality: Right;  . Tendon repar     Family History  Problem Relation Age of Onset  . Brain cancer Son   . Schizophrenia Son   . Breast cancer Maternal Aunt   . Bipolar disorder Maternal Aunt   . Drug abuse Maternal Aunt   . Colon cancer Maternal Grandmother     late 63s, early 51s  . Lung cancer Father   . Liver disease Neg Hx   . Drug abuse Mother   . Drug abuse Sister   . Drug abuse Brother   . Bipolar disorder Paternal Grandfather   . Bipolar disorder Cousin    History  Substance Use Topics  . Smoking status: Current Every Day Smoker -- 1.00 packs/day for 35 years    Types: Cigarettes  . Smokeless tobacco: Never Used  . Alcohol Use: Yes     Comment: beer or liquor daily until "buzz" at least. often in morning. states cut back lately.    OB History   Grav Para Term Preterm Abortions TAB SAB Ect Mult Living                 Review of Systems  Constitutional: Negative for  fever and chills.  Respiratory: Negative for cough and shortness of breath.   Cardiovascular: Negative for chest pain.  Gastrointestinal: Positive for nausea, vomiting, abdominal pain and diarrhea.  Genitourinary: Negative for dysuria and flank pain.  Musculoskeletal: Negative for back pain, neck pain and neck stiffness.  Skin: Positive for wound. Negative for rash.  Neurological: Negative for dizziness, weakness, light-headedness, numbness and headaches.  All other systems reviewed and are negative.     Allergies  Codeine and Morphine and related  Home Medications   Prior to Admission medications   Medication Sig Start Date End Date Taking? Authorizing Provider  ARIPiprazole  (ABILIFY) 5 MG tablet Take 1 tablet (5 mg total) by mouth 2 (two) times daily. 07/16/13  Yes Levonne Spiller, MD  doxycycline (VIBRA-TABS) 100 MG tablet Take 1 tablet (100 mg total) by mouth every 12 (twelve) hours. 07/03/13  Yes Jessica U Vann, DO  escitalopram (LEXAPRO) 20 MG tablet Take 1 tablet (20 mg total) by mouth daily. 07/16/13 07/16/14 Yes Levonne Spiller, MD  fluticasone (FLONASE) 50 MCG/ACT nasal spray Place 1 spray into both nostrils daily. 07/03/13  Yes Geradine Girt, DO  HYDROcodone-acetaminophen (NORCO/VICODIN) 5-325 MG per tablet Take 1 tablet by mouth every 6 (six) hours as needed for moderate pain. 07/03/13  Yes Geradine Girt, DO  lisinopril (PRINIVIL,ZESTRIL) 10 MG tablet Take 10 mg by mouth daily.   Yes Historical Provider, MD  ondansetron (ZOFRAN-ODT) 4 MG disintegrating tablet Take 4 mg by mouth every 8 (eight) hours as needed for nausea or vomiting. 4mg  ODT q4 hours prn nausea/vomit 06/06/13  Yes Maudry Diego, MD  zolpidem (AMBIEN CR) 12.5 MG CR tablet Take 1 tablet (12.5 mg total) by mouth at bedtime as needed for sleep. 07/16/13  Yes Levonne Spiller, MD   BP 126/96  Pulse 90  Temp(Src) 97.6 F (36.4 C) (Oral)  Resp 18  Ht 5\' 3"  (1.6 m)  Wt 127 lb (57.607 kg)  BMI 22.50 kg/m2  SpO2 97% Physical Exam  Nursing note and vitals reviewed. Constitutional: She is oriented to person, place, and time. She appears well-developed and well-nourished. No distress.  HENT:  Head: Normocephalic and atraumatic.  Mouth/Throat: Oropharynx is clear and moist.  Eyes: EOM are normal. Pupils are equal, round, and reactive to light.  Neck: Normal range of motion. Neck supple.  Cardiovascular: Normal rate and regular rhythm.   Pulmonary/Chest: Effort normal and breath sounds normal. No respiratory distress. She has no wheezes. She has no rales.  Abdominal: Soft. Bowel sounds are normal. There is tenderness.  Patient has a draining lesion of yellow material just inferior to her umbilicus in the surgical  scar line. There is tenderness to palpation. I do not see any erythema or warmth. No underlying masses or fluctuance.  Musculoskeletal: Normal range of motion. She exhibits no edema and no tenderness.  Neurological: She is alert and oriented to person, place, and time.  5/5 motor in all extremities. Sensation is grossly intact.  Skin: Skin is warm and dry. No rash noted. No erythema.  Psychiatric: She has a normal mood and affect. Her behavior is normal.    ED Course  Procedures (including critical care time) Labs Review Labs Reviewed  CBC WITH DIFFERENTIAL  COMPREHENSIVE METABOLIC PANEL  URINALYSIS, ROUTINE W REFLEX MICROSCOPIC  ACETAMINOPHEN LEVEL  ETHANOL  URINE RAPID DRUG SCREEN (HOSP PERFORMED)  SALICYLATE LEVEL  LIPASE, BLOOD    Imaging Review No results found.   EKG Interpretation None  MDM   Final diagnoses:  None    Abdominal wound appears to be chronic and unchanged. I reviewed the prior notes. It is been cultured multiple times with no growth. She remains on antibiotics and is followed closely by Dr. Arnoldo Morale.  Labs within normal limits. Patient requesting discharge home. Return precautions given.  Julianne Rice, MD 07/18/13 [phone]

## 2013-07-18 NOTE — Discharge Instructions (Signed)
Followup with your surgeon. Return for worsening pain, fever or any concerns.

## 2013-07-18 NOTE — ED Notes (Signed)
Patient also reports emesis and diarrhea x 2 days.

## 2013-07-24 ENCOUNTER — Encounter (HOSPITAL_COMMUNITY): Payer: Self-pay | Admitting: Emergency Medicine

## 2013-07-24 ENCOUNTER — Emergency Department (HOSPITAL_COMMUNITY)
Admission: EM | Admit: 2013-07-24 | Discharge: 2013-07-25 | Disposition: A | Discharge status: Home / Self Care | Payer: Medicare Other | Attending: Emergency Medicine | Admitting: Emergency Medicine

## 2013-07-24 DIAGNOSIS — Z9089 Acquired absence of other organs: Secondary | ICD-10-CM | POA: Diagnosis not present

## 2013-07-24 DIAGNOSIS — L03319 Cellulitis of trunk, unspecified: Secondary | ICD-10-CM | POA: Diagnosis not present

## 2013-07-24 DIAGNOSIS — Z862 Personal history of diseases of the blood and blood-forming organs and certain disorders involving the immune mechanism: Secondary | ICD-10-CM | POA: Diagnosis not present

## 2013-07-24 DIAGNOSIS — IMO0002 Reserved for concepts with insufficient information to code with codable children: Secondary | ICD-10-CM

## 2013-07-24 DIAGNOSIS — L02219 Cutaneous abscess of trunk, unspecified: Secondary | ICD-10-CM | POA: Diagnosis not present

## 2013-07-24 DIAGNOSIS — I10 Essential (primary) hypertension: Secondary | ICD-10-CM | POA: Insufficient documentation

## 2013-07-24 DIAGNOSIS — G8929 Other chronic pain: Secondary | ICD-10-CM | POA: Diagnosis not present

## 2013-07-24 DIAGNOSIS — Z79899 Other long term (current) drug therapy: Secondary | ICD-10-CM | POA: Diagnosis not present

## 2013-07-24 DIAGNOSIS — F17200 Nicotine dependence, unspecified, uncomplicated: Secondary | ICD-10-CM | POA: Insufficient documentation

## 2013-07-24 DIAGNOSIS — Z9889 Other specified postprocedural states: Secondary | ICD-10-CM | POA: Diagnosis not present

## 2013-07-24 DIAGNOSIS — Z8739 Personal history of other diseases of the musculoskeletal system and connective tissue: Secondary | ICD-10-CM | POA: Diagnosis not present

## 2013-07-24 DIAGNOSIS — Z90710 Acquired absence of both cervix and uterus: Secondary | ICD-10-CM | POA: Insufficient documentation

## 2013-07-24 DIAGNOSIS — Z8639 Personal history of other endocrine, nutritional and metabolic disease: Secondary | ICD-10-CM | POA: Diagnosis not present

## 2013-07-24 DIAGNOSIS — R112 Nausea with vomiting, unspecified: Secondary | ICD-10-CM | POA: Diagnosis not present

## 2013-07-24 HISTORY — DX: Suicide attempt, initial encounter: T14.91XA

## 2013-07-24 HISTORY — DX: Cutaneous abscess, unspecified: L02.91

## 2013-07-24 LAB — URINALYSIS, ROUTINE W REFLEX MICROSCOPIC
BILIRUBIN URINE: NEGATIVE
Glucose, UA: NEGATIVE mg/dL
Hgb urine dipstick: NEGATIVE
KETONES UR: NEGATIVE mg/dL
Leukocytes, UA: NEGATIVE
NITRITE: NEGATIVE
PH: 6 (ref 5.0–8.0)
Protein, ur: NEGATIVE mg/dL
Specific Gravity, Urine: 1.01 (ref 1.005–1.030)
Urobilinogen, UA: 0.2 mg/dL (ref 0.0–1.0)

## 2013-07-24 LAB — CBC WITH DIFFERENTIAL/PLATELET
Basophils Absolute: 0 10*3/uL (ref 0.0–0.1)
Basophils Relative: 0 % (ref 0–1)
Eosinophils Absolute: 0 10*3/uL (ref 0.0–0.7)
Eosinophils Relative: 1 % (ref 0–5)
HCT: 32.8 % — ABNORMAL LOW (ref 36.0–46.0)
HEMOGLOBIN: 11.6 g/dL — AB (ref 12.0–15.0)
LYMPHS ABS: 1.4 10*3/uL (ref 0.7–4.0)
LYMPHS PCT: 41 % (ref 12–46)
MCH: 29.8 pg (ref 26.0–34.0)
MCHC: 35.4 g/dL (ref 30.0–36.0)
MCV: 84.3 fL (ref 78.0–100.0)
MONOS PCT: 12 % (ref 3–12)
Monocytes Absolute: 0.4 10*3/uL (ref 0.1–1.0)
NEUTROS ABS: 1.6 10*3/uL — AB (ref 1.7–7.7)
NEUTROS PCT: 46 % (ref 43–77)
PLATELETS: 160 10*3/uL (ref 150–400)
RBC: 3.89 MIL/uL (ref 3.87–5.11)
RDW: 15.6 % — ABNORMAL HIGH (ref 11.5–15.5)
WBC: 3.5 10*3/uL — AB (ref 4.0–10.5)

## 2013-07-24 LAB — COMPREHENSIVE METABOLIC PANEL
ALK PHOS: 68 U/L (ref 39–117)
ALT: 16 U/L (ref 0–35)
AST: 29 U/L (ref 0–37)
Albumin: 3.1 g/dL — ABNORMAL LOW (ref 3.5–5.2)
BILIRUBIN TOTAL: 0.5 mg/dL (ref 0.3–1.2)
BUN: 7 mg/dL (ref 6–23)
CHLORIDE: 101 meq/L (ref 96–112)
CO2: 26 meq/L (ref 19–32)
Calcium: 8.1 mg/dL — ABNORMAL LOW (ref 8.4–10.5)
Creatinine, Ser: 0.59 mg/dL (ref 0.50–1.10)
GFR calc non Af Amer: >90 mL/min (ref >90)
GLUCOSE: 94 mg/dL (ref 70–99)
POTASSIUM: 3.7 meq/L (ref 3.7–5.3)
Sodium: 140 mEq/L (ref 137–147)
Total Protein: 6.4 g/dL (ref 6.0–8.3)

## 2013-07-24 MED ORDER — PROMETHAZINE HCL 25 MG/ML IJ SOLN
12.5000 mg | Freq: Once | INTRAMUSCULAR | Status: AC
  Administered 2013-07-24: 12.5 mg via INTRAVENOUS
  Filled 2013-07-24: qty 1

## 2013-07-24 MED ORDER — SODIUM CHLORIDE 0.9 % IV SOLN
1000.0000 mL | Freq: Once | INTRAVENOUS | Status: AC
  Administered 2013-07-24: 1000 mL via INTRAVENOUS

## 2013-07-24 MED ORDER — HYDROMORPHONE HCL PF 1 MG/ML IJ SOLN
1.0000 mg | Freq: Once | INTRAMUSCULAR | Status: AC
  Administered 2013-07-24: 1 mg via INTRAVENOUS
  Filled 2013-07-24: qty 1

## 2013-07-24 MED ORDER — FENTANYL CITRATE 0.05 MG/ML IJ SOLN
INTRAMUSCULAR | Status: AC
  Filled 2013-07-24: qty 2

## 2013-07-24 MED ORDER — FENTANYL CITRATE 0.05 MG/ML IJ SOLN
50.0000 ug | Freq: Once | INTRAMUSCULAR | Status: AC
  Administered 2013-07-24: 50 ug via INTRAVENOUS

## 2013-07-24 NOTE — ED Notes (Signed)
Had prior soft tissue abscess requiring debridment in Nov. 2014.  Noticed small open area in lower abdominal well healed incision that is draining some serous fluid.  Also has another raised, tender area on L lower abdomen.  Started having n/v/d 3 days ago.  Has been on multiple prescriptions for doxycycline since November for recurrent abscess infections.

## 2013-07-24 NOTE — ED Provider Notes (Signed)
CSN: [id number]     Arrival date & time 07/24/13  2101 History  This chart was scribed for Angel Speak, MD by Roxan Diesel, ED scribe.  This patient was seen in room [facility] and the patient's care was started at 11:08 PM.   Chief Complaint  Patient presents with  . Abdominal Pain  . Abscess    The history is provided by the patient. No language interpreter was used.    HPI Comments: Angel Price is a 48 y.o. female with h/o chronic abdominal pain and a chronic abdominal wall wound who presents to the Emergency Department complaining of recurrent abdominal pain that began last night.  Pt has had recurrent abscesses at the incision site of a gastric bypass for some time, and is followed by Dr. Arnoldo Morale who gave her debridement in November 2014.  She has continued to have recurrence of these abscesses and has been placed on doxycycline multiple times with temporary resolution in symptoms.  Pt states she finished a course of doxycycline recently and her current abscess began several days ago.  She also reports associated nausea, vomiting and diarrhea that began 3 days ago.  She denies any blood in stools or urine.  She denies urinary symptoms.  Pt is not sure why she continues to have infections and has an appointment with an ID specialist.  She has an appointment to see an infectious disease specialist.    Past Medical History  Diagnosis Date  . Lupus   . 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 Hypertension   . Abscess     soft tissue  . Suicide attempt     Past Surgical History  Procedure Laterality Date  . Abdominal surgery    . Abdominal hysterectomy  2013    [place]  . Debridement of abdominal wall abscess N/A 02/08/2013    Procedure: DEBRIDEMENT OF ABDOMINAL WALL ABSCESS;  Surgeon: Jamesetta So, MD;  Location: [facility];  Service: General;  Laterality: N/A;  . Gastric bypass       [place], first 2000, 2005/2006.  . Colonoscopy      in [place]  .  Cholecystectomy    . Colonoscopy with propofol N/A 05/20/2013    Procedure: COLONOSCOPY WITH PROPOFOL (Procedure #2) At cecum@0957  total withdrawal time=;  Surgeon: Daneil Dolin, MD;  Location: [facility];  Service: Endoscopy;  Laterality: N/A;  . Esophagogastroduodenoscopy (egd) with propofol N/A 05/20/2013    Procedure: ESOPHAGOGASTRODUODENOSCOPY (EGD) WITH PROPOFOL (Procedure #1);  Surgeon: Daneil Dolin, MD;  Location: [facility];  Service: Endoscopy;  Laterality: N/A;  . Esophageal biopsy  05/20/2013    Procedure: BIOPSIES OF ASCENDING AND SIGMOID COLON;  Surgeon: Daneil Dolin, MD;  Location: [facility];  Service: Endoscopy;;  . Wound exploration Right 06/24/2013    Procedure: exploration of traumatic wound right wrist;  Surgeon: Tennis Must, MD;  Location: [place];  Service: Orthopedics;  Laterality: Right;  . Tendon repar    . Adrenal turmor removal      Family History  Problem Relation Age of Onset  . Brain cancer Son   . Schizophrenia Son   . Breast cancer Maternal Aunt   . Bipolar disorder Maternal Aunt   . Drug abuse Maternal Aunt   . Colon cancer Maternal Grandmother     late 77s, early 28s  . Lung cancer Father   . Liver disease Neg Hx   . Drug abuse Mother   . Drug abuse Sister   .  Drug abuse Brother   . Bipolar disorder Paternal Grandfather   . Bipolar disorder Cousin     History  Substance Use Topics  . Smoking status: Current Every Day Smoker -- 0.25 packs/day for 35 years    Types: Cigarettes  . Smokeless tobacco: Never Used  . Alcohol Use: Yes     Comment: beer  often in morning. states cut back lately.     OB History   Grav Para Term Preterm Abortions TAB SAB Ect Mult Living                   Review of Systems A complete 10 system review of systems was obtained and all systems are negative except as noted in the HPI and PMH.     Allergies  Codeine and Morphine and related  Home Medications   Prior to Admission medications   Medication Sig Start Date End  Date Taking? Authorizing Provider  escitalopram (LEXAPRO) 20 MG tablet Take 1 tablet (20 mg total) by mouth daily. 07/16/13 07/16/14 Yes Levonne Spiller, MD  ARIPiprazole (ABILIFY) 5 MG tablet Take 1 tablet (5 mg total) by mouth 2 (two) times daily. 07/16/13   Levonne Spiller, MD  fluticasone (FLONASE) 50 MCG/ACT nasal spray Place 1 spray into both nostrils daily. 07/03/13   Geradine Girt, DO  lisinopril (PRINIVIL,ZESTRIL) 10 MG tablet Take 10 mg by mouth daily.    Historical Provider, MD  zolpidem (AMBIEN CR) 12.5 MG CR tablet Take 1 tablet (12.5 mg total) by mouth at bedtime as needed for sleep. 07/16/13   Levonne Spiller, MD   BP 131/96  Pulse 121  Temp(Src) 98.2 F (36.8 C) (Oral)  Resp 17  Ht 5\' 3"  (1.6 m)  Wt 127 lb (57.607 kg)  BMI 22.50 kg/m2  SpO2 98%  Physical Exam  Nursing note and vitals reviewed. Constitutional: She is oriented to person, place, and time. She appears well-developed and well-nourished. No distress.  HENT:  Head: Normocephalic and atraumatic.  Eyes: EOM are normal.  Neck: Neck supple. No tracheal deviation present.  Cardiovascular: Normal rate.   Pulmonary/Chest: Effort normal. No respiratory distress.  Abdominal: Soft. She exhibits no distension. There is no tenderness. There is no rebound and no guarding.  Just inferior to the umbilicus there is scar tissue present.  There is purulent material draining from one end of the scar.  There is slight erythema surrounding this, however no fluctuance.    Musculoskeletal: Normal range of motion.  Neurological: She is alert and oriented to person, place, and time.  Skin: Skin is warm and dry.  Psychiatric: She has a normal mood and affect. Her behavior is normal.    ED Course  Procedures (including critical care time)  DIAGNOSTIC STUDIES: Oxygen Saturation is 98% on room air, normal by my interpretation.    COORDINATION OF CARE: 11:18 PM-Discussed treatment plan which includes IV fluids, pain medication, anti-emetics, and  labs with pt at bedside and pt agreed to plan.     Labs Review Labs Reviewed  URINALYSIS, ROUTINE W REFLEX MICROSCOPIC  CBC WITH DIFFERENTIAL  COMPREHENSIVE METABOLIC PANEL    Imaging Review No results found.   EKG Interpretation None      MDM   Final diagnoses:  None    Patient is a 48 year old female with history of recurrent abdominal wall abscesses. She presents today with pain, swelling, and drainage. She states that when this occurs she is started on an antibiotic which makes this go away, then  returns. She is been followed by Dr. Arnoldo Morale for this as well as an infectious disease doctor who instructed to determine why this keeps recurring. No one thus far has figured this out.  She appears to have an abdominal wall cellulitis with possible abscess which appears to be draining. She will be treated with doxycycline, pain medication, and followup with Dr. Arnoldo Morale if not improving.    I personally performed the services described in this documentation, which was scribed in my presence. The recorded information has been reviewed and is accurate.      Angel Speak, MD 07/25/13 [phone]

## 2013-07-25 MED ORDER — OXYCODONE-ACETAMINOPHEN 5-325 MG PO TABS: 2.0000 | ORAL_TABLET | ORAL | Status: DC | PRN

## 2013-07-25 MED ORDER — DOXYCYCLINE HYCLATE 100 MG PO CAPS: 100.0000 mg | ORAL_CAPSULE | Freq: Two times a day (BID) | ORAL | Status: DC

## 2013-07-25 MED ORDER — PROMETHAZINE HCL 25 MG PO TABS: 25.0000 mg | ORAL_TABLET | Freq: Four times a day (QID) | ORAL | Status: DC | PRN

## 2013-07-25 MED ORDER — OXYCODONE-ACETAMINOPHEN 5-325 MG PO TABS
2.0000 | ORAL_TABLET | Freq: Once | ORAL | Status: AC
  Administered 2013-07-25: 2 via ORAL
  Filled 2013-07-25: qty 2

## 2013-07-25 NOTE — ED Notes (Signed)
Patient with no complaints at this time. Respirations even and unlabored. Skin warm/dry. Discharge instructions reviewed with patient at this time. Patient given opportunity to voice concerns/ask questions. IV removed per policy and band-aid applied to site. Patient discharged at this time and left Emergency Department with steady gait.  

## 2013-07-25 NOTE — Discharge Instructions (Signed)
Doxycycline as prescribed.  Percocet as prescribed as needed for pain.  Followup with Dr. Arnoldo Morale and your infectious disease doctor in the next week to be reevaluated. Return to the emergency department if you develop worsening pain, high fever, or other new and concerning symptoms.   Cellulitis Cellulitis is an infection of the skin and the tissue beneath it. The infected area is usually red and tender. Cellulitis occurs most often in the arms and lower legs.  CAUSES  Cellulitis is caused by bacteria that enter the skin through cracks or cuts in the skin. The most common types of bacteria that cause cellulitis are Staphylococcus and Streptococcus. SYMPTOMS   Redness and warmth.  Swelling.  Tenderness or pain.  Fever. DIAGNOSIS  Your caregiver can usually determine what is wrong based on a physical exam. Blood tests may also be done. TREATMENT  Treatment usually involves taking an antibiotic medicine. HOME CARE INSTRUCTIONS   Take your antibiotics as directed. Finish them even if you start to feel better.  Keep the infected arm or leg elevated to reduce swelling.  Apply a warm cloth to the affected area up to 4 times per day to relieve pain.  Only take over-the-counter or prescription medicines for pain, discomfort, or fever as directed by your caregiver.  Keep all follow-up appointments as directed by your caregiver. SEEK MEDICAL CARE IF:   You notice red streaks coming from the infected area.  Your red area gets larger or turns dark in color.  Your bone or joint underneath the infected area becomes painful after the skin has healed.  Your infection returns in the same area or another area.  You notice a swollen bump in the infected area.  You develop new symptoms. SEEK IMMEDIATE MEDICAL CARE IF:   You have a fever.  You feel very sleepy.  You develop vomiting or diarrhea.  You have a general ill feeling (malaise) with muscle aches and pains. MAKE SURE YOU:     Understand these instructions.  Will watch your condition.  Will get help right away if you are not doing well or get worse. Document Released: 12/12/2004 Document Revised: 09/03/2011 Document Reviewed: 05/20/2011 [facility] Patient Information 2014 [place].

## 2013-07-27 ENCOUNTER — Encounter: Payer: Self-pay | Admitting: Internal Medicine

## 2013-07-27 ENCOUNTER — Ambulatory Visit (INDEPENDENT_AMBULATORY_CARE_PROVIDER_SITE_OTHER): Payer: Medicare Other | Admitting: Internal Medicine

## 2013-07-27 ENCOUNTER — Telehealth: Payer: Self-pay | Admitting: *Deleted

## 2013-07-27 VITALS — BP 143/105 | HR 98 | Temp 98.2°F | Wt 132.0 lb

## 2013-07-27 DIAGNOSIS — L089 Local infection of the skin and subcutaneous tissue, unspecified: Secondary | ICD-10-CM

## 2013-07-27 DIAGNOSIS — T148XXA Other injury of unspecified body region, initial encounter: Secondary | ICD-10-CM

## 2013-07-27 DIAGNOSIS — S31109A Unspecified open wound of abdominal wall, unspecified quadrant without penetration into peritoneal cavity, initial encounter: Principal | ICD-10-CM

## 2013-07-27 MED ORDER — SULFAMETHOXAZOLE-TMP DS 800-160 MG PO TABS: 1.0000 | ORAL_TABLET | Freq: Every day | ORAL | Status: DC

## 2013-07-27 NOTE — Progress Notes (Signed)
Subjective:    Patient ID: Angel Price, female    DOB: 03-Aug-1965, 48 y.o.   MRN: [id number]  HPI [name] is a 48yo F with hx of lupus, depression, hx of gastric bypass with poor wound healing and complications of mrsa skin infection. Her gastric bypass surgery was done at outside facility several years ago. In Fall of 2014,She has residual drainage from old surgical scar inferior to umbilicus. She had I x D by Dr. Aviva Signs in November 2014 but states that she still has drainage from 3 small pinpoint regions on surgical incision line since then. She changes her dressing daily where she has some purulent drainaged noted on the pad. Her abdominal area is tender in the areas of drainage. She has been prescribed doxycycline intermittently to help with abdominal wall cellulitis/abscess. 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 Carlyon Prows fields. Due to nausea SE of doxycycline, she is often unable to take it since it worsens her gi complaints. Se had abd ct in late March 2015 that found:  Persistent prominence of abnormal soft tissue in the anterior  abdominal wall around the umbilicus, likely to represent resolving  postoperative scar tissue. No definite abdominal wall abscess is  confidently identified at this time, although the presence of  persistent infected tissue with draining sinus tracts is not  excluded, and clinical correlation with physical exam is  recommended.    Current Outpatient Prescriptions on File Prior to Visit  Medication Sig Dispense Refill  . ARIPiprazole (ABILIFY) 5 MG tablet Take 1 tablet (5 mg total) by mouth 2 (two) times daily.  60 tablet  2  . doxycycline (VIBRAMYCIN) 100 MG capsule Take 1 capsule (100 mg total) by mouth 2 (two) times daily.  20 capsule  0  . escitalopram (LEXAPRO) 20 MG tablet Take 1 tablet (20 mg total) by mouth daily.  30 tablet  2    . fluticasone (FLONASE) 50 MCG/ACT nasal spray Place 1 spray into both nostrils daily.  16 g  0  . lisinopril (PRINIVIL,ZESTRIL) 10 MG tablet Take 10 mg by mouth daily.      Marland Kitchen oxyCODONE-acetaminophen (PERCOCET) 5-325 MG per tablet Take 2 tablets by mouth every 4 (four) hours as needed.  20 tablet  0  . promethazine (PHENERGAN) 25 MG tablet Take 1 tablet (25 mg total) by mouth every 6 (six) hours as needed for nausea.  12 tablet  1  . zolpidem (AMBIEN CR) 12.5 MG CR tablet Take 1 tablet (12.5 mg total) by mouth at bedtime as needed for sleep.  30 tablet  2   No current facility-administered medications on file prior to visit.   Active Ambulatory Problems    Diagnosis Date Noted  . History of cocaine abuse 03/16/2013  . History of schizoaffective disorder 03/16/2013  . Bipolar disorder, unspecified 03/16/2013  . Personality disorder 03/16/2013  . Tobacco abuse 03/16/2013  . Alcohol abuse 03/16/2013  . Palpitations 03/16/2013  . Poor vision 03/16/2013  . History of gastric bypass 03/16/2013  . Status post hysterectomy with oophorectomy 03/16/2013  . Hypothyroid 03/16/2013  . Lupus 03/16/2013  . Frequent headaches 03/30/2013  . Sleep difficulties 03/30/2013  . Essential hypertension, benign 03/30/2013  . Abdominal wall abscess 04/18/2013  . Abscess of abdominal wall 04/19/2013  . Nausea with vomiting 05/07/2013  . Diarrhea 05/07/2013  . Rectal bleeding 05/07/2013  . Abnormal LFTs 05/07/2013  .  Adrenal mass, left 05/07/2013  . Wrist laceration 06/24/2013  . MDD (major depressive disorder), recurrent episode, severe 06/27/2013   Resolved Ambulatory Problems    Diagnosis Date Noted  . No Resolved Ambulatory Problems   Past Medical History  Diagnosis Date  . Thyroid disease 2000  . Hypertension   . Abscess   . Suicide attempt    History  Substance Use Topics  . Smoking status: Current Every Day Smoker -- 0.25 packs/day for 35 years    Types: Cigarettes  . Smokeless  tobacco: Never Used  . Alcohol Use: Yes     Comment: beer  often in morning. states cut back lately.   family history includes Bipolar disorder in her cousin, maternal aunt, and paternal grandfather; Brain cancer in her son; Breast cancer in her maternal aunt; Colon cancer in her maternal grandmother; Drug abuse in her brother, maternal aunt, mother, and sister; Lung cancer in her father; Schizophrenia in her son. There is no history of Liver disease.    Review of Systems 10 point ros is negative other than abd wound drainage, tenderness, nausea and vomiting and poor po intake    Objective:   Physical Exam BP 143/105  Pulse 98  Temp(Src) 98.2 F (36.8 C) (Oral)  Wt 132 lb (59.875 kg) Physical Exam  Constitutional:  oriented to person, place, and time. appears well-developed and well-nourished. No distress.  HENT:  Mouth/Throat: Oropharynx is clear and moist. No oropharyngeal exudate. Poor dentition Cardiovascular: Normal rate, regular rhythm and normal heart sounds. Exam reveals no gallop and no friction rub.  No murmur heard.  Pulmonary/Chest: Effort normal and breath sounds normal. No respiratory distress.  has no wheezes.  Abdominal: Soft. Bowel sounds are normal.  exhibits no distension. There is tenderness in areas of induration at lesion on left side infeciror umbilicus. Areas of induration is 2.5 cm by 3 cm long. Right lower lesion has mild clearish purulent drainage. Lymphadenopathy: no cervical adenopathy.  Neurological: alert and oriented to person, place, and time.  Skin: Skin is warm and dry. No rash noted. No erythema.  Psychiatric: a normal mood and affect. behavior is normal.       Assessment & Plan:  Abdominal wound infection = will switch to bactrim to see if she has better improvement with swallowing medication. wil have her stop taking doxycycline. Will refer her to plastics Dr. Migdalia Dk to see if she would benefit from debridement +/- reassessment with repeat  imaging to see if sinus tract worsening since last imaging.  Chronic Nausea/vomiting = will give SL zofran. Will need for her to follow up with dr. Carlyon Prows for improvement of symptoms and further management.

## 2013-07-27 NOTE — Telephone Encounter (Signed)
Per Dr Baxter Flattery called Dr Migdalia Dk office to schedule an appt for this patient to have her wound debrieded. Was given 08/03/13 at 1045. Gave the patient the information [address], [phone] and advised her to call the office if she has any questions.

## 2013-08-03 DIAGNOSIS — S31109A Unspecified open wound of abdominal wall, unspecified quadrant without penetration into peritoneal cavity, initial encounter: Secondary | ICD-10-CM | POA: Diagnosis not present

## 2013-08-04 ENCOUNTER — Inpatient Hospital Stay: Payer: Self-pay | Admitting: Infectious Disease

## 2013-08-11 ENCOUNTER — Telehealth: Payer: Self-pay | Admitting: Gastroenterology

## 2013-08-11 NOTE — Telephone Encounter (Signed)
Review of records. Multiple records received on patient, date of birth listed as [birth year], previous name Angel Price. History consistent with this patient.  August 2005, EGD by Dr. Parks Neptune at 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 Large amount of bilious fluid retained in the gastric pouch. 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 Since then, she has had recurrent nausea and vomiting."  August 2005, [facility]. Positive glucose hydrogen breath test consistent with bacterial overgrowth.  November 2005. EGD by Dr. West Carbo. Lot of gastric contents retained, no evidence of outlet extraction.  November 2005, colonoscopy Dr. Barnabas Lister Spaimhour, no mucosal abnormalities.  June 2009, EGD by Dr. Kenna Gilbert, solid food in the stomach a large anastomosis to the small bowel. No evidence of obstruction. Suspected gastroparesis related to vagotomy.

## 2013-08-13 ENCOUNTER — Ambulatory Visit (HOSPITAL_COMMUNITY): Payer: Self-pay | Admitting: Psychiatry

## 2013-08-16 ENCOUNTER — Ambulatory Visit (HOSPITAL_COMMUNITY): Payer: Self-pay | Admitting: Psychiatry

## 2013-08-16 ENCOUNTER — Ambulatory Visit (INDEPENDENT_AMBULATORY_CARE_PROVIDER_SITE_OTHER): Payer: Medicare Other | Admitting: Surgery

## 2013-08-16 ENCOUNTER — Encounter (INDEPENDENT_AMBULATORY_CARE_PROVIDER_SITE_OTHER): Payer: Self-pay | Admitting: Surgery

## 2013-08-16 VITALS — BP 126/88 | HR 80 | Temp 98.4°F | Resp 14 | Ht 63.0 in | Wt 129.0 lb

## 2013-08-16 DIAGNOSIS — T148XXA Other injury of unspecified body region, initial encounter: Secondary | ICD-10-CM | POA: Diagnosis not present

## 2013-08-16 DIAGNOSIS — L089 Local infection of the skin and subcutaneous tissue, unspecified: Secondary | ICD-10-CM

## 2013-08-16 DIAGNOSIS — S31109A Unspecified open wound of abdominal wall, unspecified quadrant without penetration into peritoneal cavity, initial encounter: Principal | ICD-10-CM

## 2013-08-16 NOTE — Patient Instructions (Signed)
Will need to discuss with your plastic surgeon.

## 2013-08-16 NOTE — Progress Notes (Signed)
Patient ID: Angel Price, female   DOB: 06/17/1965, 48 y.o.   MRN: [id number]  Chief Complaint  Patient presents with  . Other    new pt- eval abd scar tissue in abdomen    HPI TRUE Angel Price is a 48 y.o. female.  Patient sent at the request of Dr. Migdalia Dk for chronic lower abdominal wound. Patient has a chronic history of multiple bowel operations and recently had abscess drained from around her umbilicus back in November 2014 by Dr. Aviva Signs. This area has not healed completely. She has been followed by the wound clinic and Dr. Migdalia Dk. She has had multiple abdominal operations. She continues to smoke. She was sent for evaluation for possible enterocutaneous fistula contributing to this. The area is tender and sore which is chronic. She's had workup which includes a colonoscopy in March. CT scan from March showed scar tissue without any to the patient the GI tract. Motion drainage is yellowish in nature. Reviewed operative note from November which showed an abscess cavity of fat necrosis. There was  no violation of the abdominal wall. HPI  Past Medical History  Diagnosis Date  . Lupus   . 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 Hypertension   . Abscess     soft tissue  . Suicide attempt   . Blood transfusion without reported diagnosis     Past Surgical History  Procedure Laterality Date  . Abdominal surgery    . Abdominal hysterectomy  2013    [place]  . Debridement of abdominal wall abscess N/A 02/08/2013    Procedure: DEBRIDEMENT OF ABDOMINAL WALL ABSCESS;  Surgeon: Jamesetta So, MD;  Location: [facility];  Service: General;  Laterality: N/A;  . Gastric bypass       [place], first 2000, 2005/2006.  . Colonoscopy      in [place]  . Cholecystectomy    . Colonoscopy with propofol N/A 05/20/2013    Procedure: COLONOSCOPY WITH PROPOFOL (Procedure #2) At cecum@0957  total withdrawal time=;  Surgeon: Daneil Dolin, MD;  Location: [facility];  Service: Endoscopy;  Laterality:  N/A;  . Esophagogastroduodenoscopy (egd) with propofol N/A 05/20/2013    Procedure: ESOPHAGOGASTRODUODENOSCOPY (EGD) WITH PROPOFOL (Procedure #1);  Surgeon: Daneil Dolin, MD;  Location: [facility];  Service: Endoscopy;  Laterality: N/A;  . Esophageal biopsy  05/20/2013    Procedure: BIOPSIES OF ASCENDING AND SIGMOID COLON;  Surgeon: Daneil Dolin, MD;  Location: [facility];  Service: Endoscopy;;  . Wound exploration Right 06/24/2013    Procedure: exploration of traumatic wound right wrist;  Surgeon: Tennis Must, MD;  Location: [place];  Service: Orthopedics;  Laterality: Right;  . Tendon repar    . Adrenal turmor removal      Family History  Problem Relation Age of Onset  . Brain cancer Son   . Schizophrenia Son   . Cancer Son     brain  . Breast cancer Maternal Aunt   . Bipolar disorder Maternal Aunt   . Drug abuse Maternal Aunt   . Colon cancer Maternal Grandmother     late 49s, early 32s  . Lung cancer Father   . Cancer Father     mets  . Liver disease Neg Hx   . Drug abuse Mother   . Drug abuse Sister   . Drug abuse Brother   . Bipolar disorder Paternal Grandfather   . Bipolar disorder Cousin     Social History History  Substance Use Topics  .  Smoking status: Current Every Day Smoker -- 0.50 packs/day for 35 years    Types: Cigarettes  . Smokeless tobacco: Never Used  . Alcohol Use: Yes     Comment: beer  often in morning. states cut back lately.     Allergies  Allergen Reactions  . Codeine Hives  . Morphine And Related     gastritis    Current Outpatient Prescriptions  Medication Sig Dispense Refill  . ARIPiprazole (ABILIFY) 5 MG tablet Take 1 tablet (5 mg total) by mouth 2 (two) times daily.  60 tablet  2  . escitalopram (LEXAPRO) 20 MG tablet Take 1 tablet (20 mg total) by mouth daily.  30 tablet  2  . fluticasone (FLONASE) 50 MCG/ACT nasal spray Place 1 spray into both nostrils daily.  16 g  0  . lisinopril (PRINIVIL,ZESTRIL) 10 MG tablet Take 10 mg by mouth daily.       Marland Kitchen oxyCODONE-acetaminophen (PERCOCET) 5-325 MG per tablet Take 2 tablets by mouth every 4 (four) hours as needed.  20 tablet  0  . promethazine (PHENERGAN) 25 MG tablet Take 1 tablet (25 mg total) by mouth every 6 (six) hours as needed for nausea.  12 tablet  1  . sulfamethoxazole-trimethoprim (BACTRIM DS) 800-160 MG per tablet Take 1 tablet by mouth daily.  60 tablet  2  . zolpidem (AMBIEN CR) 12.5 MG CR tablet Take 1 tablet (12.5 mg total) by mouth at bedtime as needed for sleep.  30 tablet  2   No current facility-administered medications for this visit.    Review of Systems Review of Systems  Constitutional: Positive for fatigue.  HENT: Negative.   Eyes: Negative.   Respiratory: Negative.   Cardiovascular: Negative.   Gastrointestinal: Positive for abdominal pain.  Endocrine: Negative.   Genitourinary: Negative.   Neurological: Negative.   Psychiatric/Behavioral: Positive for dysphoric mood and decreased concentration. Negative for self-injury. The patient is nervous/anxious and is hyperactive.     Blood pressure 126/88, pulse 80, temperature 98.4 F (36.9 C), temperature source Oral, resp. rate 14, height 5\' 3"  (1.6 m), weight 129 lb (58.514 kg).  Physical Exam Physical Exam  HENT:  Head: Normocephalic and atraumatic.  Eyes: Pupils are equal, round, and reactive to light. No scleral icterus.  Neck: Normal range of motion.  Abdominal:    Skin: She is diaphoretic.    Data Reviewed Chart,  CT operative reports  Assessment    Chronic abdominal wound Patient Active Problem List   Diagnosis Date Noted  . Chronic abdominal wound infection 08/16/2013  . MDD (major depressive disorder), recurrent episode, severe 06/27/2013  . Wrist laceration 06/24/2013  . Nausea with vomiting 05/07/2013  . Diarrhea 05/07/2013  . Rectal bleeding 05/07/2013  . Abnormal LFTs 05/07/2013  . Adrenal mass, left 05/07/2013  . Abscess of abdominal wall 04/19/2013  . Abdominal wall  abscess 04/18/2013  . Frequent headaches 03/30/2013  . Sleep difficulties 03/30/2013  . Essential hypertension, benign 03/30/2013  . History of cocaine abuse 03/16/2013  . History of schizoaffective disorder 03/16/2013  . Bipolar disorder, unspecified 03/16/2013  . Personality disorder 03/16/2013  . Tobacco abuse 03/16/2013  . Alcohol abuse 03/16/2013  . Palpitations 03/16/2013  . Poor vision 03/16/2013  . History of gastric bypass 03/16/2013  . Status post hysterectomy with oophorectomy 03/16/2013  . Hypothyroid 03/16/2013  . Lupus 03/16/2013      Plan    This is unlikely an EC  [place].  Needs to be treated by wound expert  at  this point given multiple medical problems.  Unfortunately, I have nothing new to offer her at this point.  May be better managed at tertiary care center.        Orell Hurtado A. Oliver Heitzenrater 08/16/2013, 3:21 PM

## 2013-09-08 ENCOUNTER — Encounter: Payer: Self-pay | Admitting: Internal Medicine

## 2013-09-16 ENCOUNTER — Ambulatory Visit: Payer: Self-pay | Admitting: Internal Medicine

## 2013-09-16 ENCOUNTER — Encounter (HOSPITAL_COMMUNITY): Payer: Self-pay | Admitting: Emergency Medicine

## 2013-09-16 ENCOUNTER — Emergency Department (HOSPITAL_COMMUNITY)
Admission: EM | Admit: 2013-09-16 | Discharge: 2013-09-16 | Disposition: A | Discharge status: Home / Self Care | Payer: Medicare Other | Attending: Emergency Medicine | Admitting: Emergency Medicine

## 2013-09-16 DIAGNOSIS — Z79899 Other long term (current) drug therapy: Secondary | ICD-10-CM | POA: Insufficient documentation

## 2013-09-16 DIAGNOSIS — I10 Essential (primary) hypertension: Secondary | ICD-10-CM | POA: Diagnosis not present

## 2013-09-16 DIAGNOSIS — L0291 Cutaneous abscess, unspecified: Secondary | ICD-10-CM | POA: Diagnosis not present

## 2013-09-16 DIAGNOSIS — Z8639 Personal history of other endocrine, nutritional and metabolic disease: Secondary | ICD-10-CM | POA: Insufficient documentation

## 2013-09-16 DIAGNOSIS — IMO0002 Reserved for concepts with insufficient information to code with codable children: Secondary | ICD-10-CM | POA: Diagnosis not present

## 2013-09-16 DIAGNOSIS — Z862 Personal history of diseases of the blood and blood-forming organs and certain disorders involving the immune mechanism: Secondary | ICD-10-CM | POA: Insufficient documentation

## 2013-09-16 DIAGNOSIS — Y839 Surgical procedure, unspecified as the cause of abnormal reaction of the patient, or of later complication, without mention of misadventure at the time of the procedure: Secondary | ICD-10-CM | POA: Insufficient documentation

## 2013-09-16 DIAGNOSIS — T8140XA Infection following a procedure, unspecified, initial encounter: Secondary | ICD-10-CM | POA: Diagnosis not present

## 2013-09-16 DIAGNOSIS — F17200 Nicotine dependence, unspecified, uncomplicated: Secondary | ICD-10-CM | POA: Diagnosis not present

## 2013-09-16 DIAGNOSIS — M329 Systemic lupus erythematosus, unspecified: Secondary | ICD-10-CM | POA: Insufficient documentation

## 2013-09-16 DIAGNOSIS — L02219 Cutaneous abscess of trunk, unspecified: Secondary | ICD-10-CM | POA: Diagnosis not present

## 2013-09-16 DIAGNOSIS — L0390 Cellulitis, unspecified: Secondary | ICD-10-CM

## 2013-09-16 LAB — CBC WITH DIFFERENTIAL/PLATELET
BASOS ABS: 0 10*3/uL (ref 0.0–0.1)
BASOS PCT: 0 % (ref 0–1)
EOS ABS: 0 10*3/uL (ref 0.0–0.7)
Eosinophils Relative: 1 % (ref 0–5)
HEMATOCRIT: 36.8 % (ref 36.0–46.0)
Hemoglobin: 13.2 g/dL (ref 12.0–15.0)
Lymphocytes Relative: 36 % (ref 12–46)
Lymphs Abs: 1.6 10*3/uL (ref 0.7–4.0)
MCH: 30.8 pg (ref 26.0–34.0)
MCHC: 35.9 g/dL (ref 30.0–36.0)
MCV: 85.8 fL (ref 78.0–100.0)
MONOS PCT: 9 % (ref 3–12)
Monocytes Absolute: 0.4 10*3/uL (ref 0.1–1.0)
Neutro Abs: 2.3 10*3/uL (ref 1.7–7.7)
Neutrophils Relative %: 54 % (ref 43–77)
Platelets: 352 10*3/uL (ref 150–400)
RBC: 4.29 MIL/uL (ref 3.87–5.11)
RDW: 17.3 % — ABNORMAL HIGH (ref 11.5–15.5)
WBC: 4.3 10*3/uL (ref 4.0–10.5)

## 2013-09-16 LAB — COMPREHENSIVE METABOLIC PANEL
ALT: 25 U/L (ref 0–35)
AST: 38 U/L — ABNORMAL HIGH (ref 0–37)
Albumin: 3.9 g/dL (ref 3.5–5.2)
Alkaline Phosphatase: 86 U/L (ref 39–117)
Anion gap: 16 — ABNORMAL HIGH (ref 5–15)
BUN: 10 mg/dL (ref 6–23)
CALCIUM: 9.3 mg/dL (ref 8.4–10.5)
CO2: 22 mEq/L (ref 19–32)
Chloride: 99 mEq/L (ref 96–112)
Creatinine, Ser: 0.41 mg/dL — ABNORMAL LOW (ref 0.50–1.10)
GFR calc Af Amer: >90 mL/min (ref >90)
GFR calc non Af Amer: >90 mL/min (ref >90)
Glucose, Bld: 101 mg/dL — ABNORMAL HIGH (ref 70–99)
Potassium: 4 mEq/L (ref 3.7–5.3)
Sodium: 137 mEq/L (ref 137–147)
TOTAL PROTEIN: 7.9 g/dL (ref 6.0–8.3)
Total Bilirubin: 0.3 mg/dL (ref 0.3–1.2)

## 2013-09-16 LAB — URINALYSIS, ROUTINE W REFLEX MICROSCOPIC
Bilirubin Urine: NEGATIVE
Glucose, UA: NEGATIVE mg/dL
Hgb urine dipstick: NEGATIVE
KETONES UR: NEGATIVE mg/dL
LEUKOCYTES UA: NEGATIVE
NITRITE: NEGATIVE
PH: 5 (ref 5.0–8.0)
Protein, ur: NEGATIVE mg/dL
Urobilinogen, UA: 0.2 mg/dL (ref 0.0–1.0)

## 2013-09-16 MED ORDER — DOXYCYCLINE HYCLATE 100 MG PO CAPS: 100.0000 mg | ORAL_CAPSULE | Freq: Two times a day (BID) | ORAL | Status: DC

## 2013-09-16 MED ORDER — OXYCODONE-ACETAMINOPHEN 5-325 MG PO TABS: 1.0000 | ORAL_TABLET | ORAL | Status: DC | PRN

## 2013-09-16 MED ORDER — HYDROMORPHONE HCL PF 1 MG/ML IJ SOLN
1.0000 mg | Freq: Once | INTRAMUSCULAR | Status: DC
  Filled 2013-09-16: qty 1

## 2013-09-16 MED ORDER — PROMETHAZINE HCL 25 MG PO TABS: 25.0000 mg | ORAL_TABLET | Freq: Four times a day (QID) | ORAL | Status: DC | PRN

## 2013-09-16 MED ORDER — ONDANSETRON HCL 4 MG/2ML IJ SOLN
4.0000 mg | Freq: Once | INTRAMUSCULAR | Status: AC
  Administered 2013-09-16: 4 mg via INTRAVENOUS

## 2013-09-16 MED ORDER — ONDANSETRON HCL 4 MG/2ML IJ SOLN
4.0000 mg | Freq: Once | INTRAMUSCULAR | Status: DC
  Filled 2013-09-16: qty 2

## 2013-09-16 MED ORDER — PROMETHAZINE HCL 12.5 MG PO TABS
25.0000 mg | ORAL_TABLET | Freq: Once | ORAL | Status: AC
  Administered 2013-09-16: 25 mg via ORAL
  Filled 2013-09-16: qty 2

## 2013-09-16 MED ORDER — HYDROMORPHONE HCL PF 1 MG/ML IJ SOLN
1.0000 mg | Freq: Once | INTRAMUSCULAR | Status: AC
  Administered 2013-09-16: 1 mg via INTRAVENOUS

## 2013-09-16 MED ORDER — OXYCODONE-ACETAMINOPHEN 5-325 MG PO TABS
2.0000 | ORAL_TABLET | Freq: Once | ORAL | Status: AC
  Administered 2013-09-16: 2 via ORAL
  Filled 2013-09-16: qty 2

## 2013-09-16 MED ORDER — SODIUM CHLORIDE 0.9 % IV SOLN
Freq: Once | INTRAVENOUS | Status: AC
  Administered 2013-09-16: 13:00:00 via INTRAVENOUS

## 2013-09-16 NOTE — ED Notes (Signed)
Pt states she had belly surgery 01/2013 for a abdominal infection. States the incision has popped open several times.states she has been to several surgeons but she has been told they cannot help her. Pt has a small open area in the incision site.states it popped open last night.

## 2013-09-16 NOTE — ED Notes (Signed)
Pt c/o abdominal pain and pain from incision site. Pt had abd sx in November 2014 and since has had problems with incision opening with drainage. Drainage noted with odor.

## 2013-09-16 NOTE — ED Notes (Signed)
Pt requesting something to eat, drink, and something for pain and nausea

## 2013-09-16 NOTE — ED Provider Notes (Signed)
Patient seen/examined in the Emergency Department in conjunction with Midlevel Provider [place] Patient reports abd pain at incision site.  She has h/o incision with poor healing Exam : awake/alert watching TV, no distress, abd soft to palpation, small area of drainage but no erythema Plan: stable for d/c home and gen surgery followup    Sharyon Cable, MD 09/16/13 1415

## 2013-09-16 NOTE — Discharge Instructions (Signed)
Abscess °An abscess (boil or furuncle) is an infected area on or under the skin. This area is filled with yellowish-white fluid (pus) and other material (debris). °HOME CARE  °· Only take medicines as told by your doctor. °· If you were given antibiotic medicine, take it as directed. Finish the medicine even if you start to feel better. °· If gauze is used, follow your doctor's directions for changing the gauze. °· To avoid spreading the infection: °¨ Keep your abscess covered with a bandage. °¨ Wash your hands well. °¨ Do not share personal care items, towels, or whirlpools with others. °¨ Avoid skin contact with others. °· Keep your skin and clothes clean around the abscess. °· Keep all doctor visits as told. °GET HELP RIGHT AWAY IF:  °· You have more pain, puffiness (swelling), or redness in the wound site. °· You have more fluid or blood coming from the wound site. °· You have muscle aches, chills, or you feel sick. °· You have a fever. °MAKE SURE YOU:  °· Understand these instructions. °· Will watch your condition. °· Will get help right away if you are not doing well or get worse. °Document Released: 08/21/2007 Document Revised: 09/03/2011 Document Reviewed: 05/17/2011 °Exit[employer]® Patient Information ©2015 [employer]. This information is not intended to replace advice given to you by your health care provider. Make sure you discuss any questions you have with your health care provider. ° °

## 2013-09-25 NOTE — ED Provider Notes (Signed)
CSN: [id number]     Arrival date & time 09/16/13  1034 History   First MD Initiated Contact with Patient 09/16/13 1105     Chief Complaint  Patient presents with  . Abdominal Pain  . Drainage from Incision     (Consider location/radiation/quality/duration/timing/severity/associated sxs/prior Treatment) Patient is a 48 y.o. female presenting with abdominal pain. The history is provided by the patient.  Abdominal Pain Pain location: midline abdomen. Pain quality: throbbing   Pain radiates to:  Does not radiate Pain severity:  Moderate Onset quality:  Gradual Timing:  Intermittent Progression:  Unchanged Chronicity:  Chronic Ineffective treatments:  None tried Associated symptoms: no chest pain, no chills, no dysuria, no fever, no hematemesis, no hematochezia, no hematuria, no melena, no nausea, no shortness of breath, no vaginal bleeding, no vaginal discharge and no vomiting   Risk factors: multiple surgeries    Patient with hx of multiple abdominal surgeries and poor healing of her incision sites, c/o drainage to an opening of her surgical incision site that began several days ago.  She reports a foul odor to the drainage as well.  This is a recurrent problem.  She denies fever, vomiting, chills, decreased appetite, or change in bowel habits or dysuria.   Past Medical History  Diagnosis Date  . Lupus   . 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 Hypertension   . Abscess     soft tissue  . Suicide attempt   . Blood transfusion without reported diagnosis    Past Surgical History  Procedure Laterality Date  . Abdominal surgery    . Abdominal hysterectomy  2013    [place]  . Debridement of abdominal wall abscess N/A 02/08/2013    Procedure: DEBRIDEMENT OF ABDOMINAL WALL ABSCESS;  Surgeon: Jamesetta So, MD;  Location: [facility];  Service: General;  Laterality: N/A;  . Gastric bypass       [place], first 2000, 2005/2006.  . Colonoscopy      in [place]  .  Cholecystectomy    . Colonoscopy with propofol N/A 05/20/2013    Procedure: COLONOSCOPY WITH PROPOFOL (Procedure #2) At cecum@0957  total withdrawal time=;  Surgeon: Daneil Dolin, MD;  Location: [facility];  Service: Endoscopy;  Laterality: N/A;  . Esophagogastroduodenoscopy (egd) with propofol N/A 05/20/2013    Procedure: ESOPHAGOGASTRODUODENOSCOPY (EGD) WITH PROPOFOL (Procedure #1);  Surgeon: Daneil Dolin, MD;  Location: [facility];  Service: Endoscopy;  Laterality: N/A;  . Esophageal biopsy  05/20/2013    Procedure: BIOPSIES OF ASCENDING AND SIGMOID COLON;  Surgeon: Daneil Dolin, MD;  Location: [facility];  Service: Endoscopy;;  . Wound exploration Right 06/24/2013    Procedure: exploration of traumatic wound right wrist;  Surgeon: Tennis Must, MD;  Location: [place];  Service: Orthopedics;  Laterality: Right;  . Tendon repar    . Adrenal turmor removal     Family History  Problem Relation Age of Onset  . Brain cancer Son   . Schizophrenia Son   . Cancer Son     brain  . Breast cancer Maternal Aunt   . Bipolar disorder Maternal Aunt   . Drug abuse Maternal Aunt   . Colon cancer Maternal Grandmother     late 50s, early 51s  . Lung cancer Father   . Cancer Father     mets  . Liver disease Neg Hx   . Drug abuse Mother   . Drug abuse Sister   . Drug abuse Brother   .  Bipolar disorder Paternal Grandfather   . Bipolar disorder Cousin    History  Substance Use Topics  . Smoking status: Current Every Day Smoker -- 0.50 packs/day for 35 years    Types: Cigarettes  . Smokeless tobacco: Never Used  . Alcohol Use: Yes     Comment: beer  often in morning. states cut back lately.    OB History   Grav Para Term Preterm Abortions TAB SAB Ect Mult Living                 Review of Systems  Constitutional: Negative for fever, chills and appetite change.  Respiratory: Negative for shortness of breath.   Cardiovascular: Negative for chest pain.  Gastrointestinal: Positive for abdominal pain.  Negative for nausea, vomiting, blood in stool, melena, hematochezia and hematemesis.  Genitourinary: Negative for dysuria, hematuria, flank pain, decreased urine volume, vaginal bleeding, vaginal discharge and difficulty urinating.  Musculoskeletal: Negative for back pain.  Skin: Positive for wound. Negative for color change and rash.       Slightly serous drainage from surgical scar to midline abdomen  Neurological: Negative for dizziness, weakness and numbness.  Hematological: Negative for adenopathy.  All other systems reviewed and are negative.     Allergies  Codeine and Morphine and related  Home Medications   Prior to Admission medications   Medication Sig Start Date End Date Taking? Authorizing Provider  ARIPiprazole (ABILIFY) 5 MG tablet Take 1 tablet (5 mg total) by mouth 2 (two) times daily. 07/16/13  Yes Levonne Spiller, MD  escitalopram (LEXAPRO) 20 MG tablet Take 1 tablet (20 mg total) by mouth daily. 07/16/13 07/16/14 Yes Levonne Spiller, MD  fluticasone (FLONASE) 50 MCG/ACT nasal spray Place 1 spray into both nostrils daily. 07/03/13  Yes Geradine Girt, DO  ibuprofen (ADVIL,MOTRIN) 200 MG tablet Take 200 mg by mouth 2 (two) times daily as needed for moderate pain.   Yes Historical Provider, MD  lisinopril (PRINIVIL,ZESTRIL) 10 MG tablet Take 10 mg by mouth daily.   Yes Historical Provider, MD  sulfamethoxazole-trimethoprim (BACTRIM DS) 800-160 MG per tablet Take 1 tablet by mouth daily. 07/27/13  Yes Carlyle Basques, MD  zolpidem (AMBIEN CR) 12.5 MG CR tablet Take 1 tablet (12.5 mg total) by mouth at bedtime as needed for sleep. 07/16/13  Yes Levonne Spiller, MD  doxycycline (VIBRAMYCIN) 100 MG capsule Take 1 capsule (100 mg total) by mouth 2 (two) times daily. For 10 days 09/16/13   Kuulei Kleier L. Ursala Cressy, PA-C  oxyCODONE-acetaminophen (PERCOCET/ROXICET) 5-325 MG per tablet Take 1 tablet by mouth every 4 (four) hours as needed. 09/16/13   Ashland Wiseman L. Marce Charlesworth, PA-C  promethazine (PHENERGAN) 25 MG  tablet Take 1 tablet (25 mg total) by mouth every 6 (six) hours as needed for nausea or vomiting. 09/16/13   Audrey Eller L. Datron Brakebill, PA-C   BP 129/100  Pulse 78  Temp(Src) 98.3 F (36.8 C) (Oral)  Resp 18  Ht 5\' 3"  (1.6 m)  Wt 132 lb (59.875 kg)  BMI 23.39 kg/m2  SpO2 97% Physical Exam  Nursing note and vitals reviewed. Constitutional: She is oriented to person, place, and time. She appears well-developed and well-nourished. No distress.  HENT:  Head: Normocephalic and atraumatic.  Mouth/Throat: Oropharynx is clear and moist.  Cardiovascular: Normal rate, regular rhythm, normal heart sounds and intact distal pulses.   No murmur heard. Pulmonary/Chest: Effort normal and breath sounds normal. No respiratory distress. She exhibits no tenderness.  Abdominal: Soft. Bowel sounds are normal. She exhibits no  distension and no mass. There is tenderness. There is no rebound and no guarding.  Surgical scar to midline abdomen with pin point opening at the distal portion of the incision.  Mild serous drainage.  No surrounding erythema or excessive warmth.  Appears chronic  Musculoskeletal: Normal range of motion. She exhibits no edema.  Neurological: She is alert and oriented to person, place, and time. She exhibits normal muscle tone. Coordination normal.  Skin: Skin is warm and dry.    ED Course  Procedures (including critical care time) Labs Review Labs Reviewed  CBC WITH DIFFERENTIAL - Abnormal; Notable for the following:    RDW 17.3 (*)    All other components within normal limits  COMPREHENSIVE METABOLIC PANEL - Abnormal; Notable for the following:    Glucose, Bld 101 (*)    Creatinine, Ser 0.41 (*)    AST 38 (*)    Anion gap 16 (*)    All other components within normal limits  URINALYSIS, ROUTINE W REFLEX MICROSCOPIC - Abnormal; Notable for the following:    Specific Gravity, Urine >1.030 (*)    All other components within normal limits    Imaging Review No results found.   EKG  Interpretation None      MDM   Final diagnoses:  Abscess, abdomen    Recurrent, drainage from poorly healing surgical incision to the abdomen.  Pt is otherwise well appearing, non-toxic.  No concerning sx's for acute abdomen or SBO at this time.  Labs are wnml.  Pt is feeling better after medications.  She was also seen by Dr. Christy Gentles and care plan discussed.  Patient appears stable for d/c and care plan discussed.  She prefers to arrange f/u with Dr. Arnoldo Morale.  She agrees to return here if the sx's worsen.  Rx's for doxycycline , phenergan and percocet.      Janelle Spellman L. Vanessa [place], PA-C 09/25/13 1359

## 2013-09-26 NOTE — ED Provider Notes (Signed)
Medical screening examination/treatment/procedure(s) were conducted as a shared visit with non-physician practitioner(s) and myself.  I personally evaluated the patient during the encounter.   EKG Interpretation None        Sharyon Cable, MD 09/26/13 [phone]

## 2013-09-29 ENCOUNTER — Emergency Department (HOSPITAL_COMMUNITY)
Admission: EM | Admit: 2013-09-29 | Discharge: 2013-09-29 | Disposition: A | Discharge status: Home / Self Care | Payer: Medicare Other | Attending: Emergency Medicine | Admitting: Emergency Medicine

## 2013-09-29 ENCOUNTER — Encounter (HOSPITAL_COMMUNITY): Payer: Self-pay | Admitting: Emergency Medicine

## 2013-09-29 DIAGNOSIS — Z9889 Other specified postprocedural states: Secondary | ICD-10-CM | POA: Insufficient documentation

## 2013-09-29 DIAGNOSIS — I10 Essential (primary) hypertension: Secondary | ICD-10-CM | POA: Insufficient documentation

## 2013-09-29 DIAGNOSIS — Z8639 Personal history of other endocrine, nutritional and metabolic disease: Secondary | ICD-10-CM | POA: Diagnosis not present

## 2013-09-29 DIAGNOSIS — L02219 Cutaneous abscess of trunk, unspecified: Secondary | ICD-10-CM | POA: Diagnosis not present

## 2013-09-29 DIAGNOSIS — Z79899 Other long term (current) drug therapy: Secondary | ICD-10-CM | POA: Diagnosis not present

## 2013-09-29 DIAGNOSIS — Z862 Personal history of diseases of the blood and blood-forming organs and certain disorders involving the immune mechanism: Secondary | ICD-10-CM | POA: Insufficient documentation

## 2013-09-29 DIAGNOSIS — Z90710 Acquired absence of both cervix and uterus: Secondary | ICD-10-CM | POA: Insufficient documentation

## 2013-09-29 DIAGNOSIS — F17200 Nicotine dependence, unspecified, uncomplicated: Secondary | ICD-10-CM | POA: Diagnosis not present

## 2013-09-29 DIAGNOSIS — R112 Nausea with vomiting, unspecified: Secondary | ICD-10-CM | POA: Diagnosis not present

## 2013-09-29 DIAGNOSIS — L03311 Cellulitis of abdominal wall: Secondary | ICD-10-CM

## 2013-09-29 DIAGNOSIS — R109 Unspecified abdominal pain: Secondary | ICD-10-CM | POA: Insufficient documentation

## 2013-09-29 DIAGNOSIS — L03319 Cellulitis of trunk, unspecified: Principal | ICD-10-CM

## 2013-09-29 LAB — BASIC METABOLIC PANEL
ANION GAP: 15 (ref 5–15)
BUN: 8 mg/dL (ref 6–23)
CHLORIDE: 102 meq/L (ref 96–112)
CO2: 21 mEq/L (ref 19–32)
Calcium: 9 mg/dL (ref 8.4–10.5)
Creatinine, Ser: 0.48 mg/dL — ABNORMAL LOW (ref 0.50–1.10)
GFR calc non Af Amer: >90 mL/min (ref >90)
Glucose, Bld: 81 mg/dL (ref 70–99)
POTASSIUM: 3.9 meq/L (ref 3.7–5.3)
Sodium: 138 mEq/L (ref 137–147)

## 2013-09-29 LAB — CBC WITH DIFFERENTIAL/PLATELET
BASOS ABS: 0 10*3/uL (ref 0.0–0.1)
BASOS PCT: 0 % (ref 0–1)
EOS ABS: 0 10*3/uL (ref 0.0–0.7)
Eosinophils Relative: 1 % (ref 0–5)
HCT: 35.2 % — ABNORMAL LOW (ref 36.0–46.0)
Hemoglobin: 12.5 g/dL (ref 12.0–15.0)
Lymphocytes Relative: 29 % (ref 12–46)
Lymphs Abs: 1.6 10*3/uL (ref 0.7–4.0)
MCH: 30.7 pg (ref 26.0–34.0)
MCHC: 35.5 g/dL (ref 30.0–36.0)
MCV: 86.5 fL (ref 78.0–100.0)
Monocytes Absolute: 0.5 10*3/uL (ref 0.1–1.0)
Monocytes Relative: 9 % (ref 3–12)
NEUTROS ABS: 3.3 10*3/uL (ref 1.7–7.7)
NEUTROS PCT: 61 % (ref 43–77)
PLATELETS: 227 10*3/uL (ref 150–400)
RBC: 4.07 MIL/uL (ref 3.87–5.11)
RDW: 17.1 % — AB (ref 11.5–15.5)
WBC: 5.4 10*3/uL (ref 4.0–10.5)

## 2013-09-29 LAB — HEPATIC FUNCTION PANEL
ALK PHOS: 93 U/L (ref 39–117)
ALT: 34 U/L (ref 0–35)
AST: 67 U/L — ABNORMAL HIGH (ref 0–37)
Albumin: 3.9 g/dL (ref 3.5–5.2)
Bilirubin, Direct: <0.2 mg/dL (ref 0.0–0.3)
TOTAL PROTEIN: 7.5 g/dL (ref 6.0–8.3)
Total Bilirubin: 0.7 mg/dL (ref 0.3–1.2)

## 2013-09-29 LAB — LIPASE, BLOOD: Lipase: 22 U/L (ref 11–59)

## 2013-09-29 MED ORDER — HYDROMORPHONE HCL PF 1 MG/ML IJ SOLN
1.0000 mg | Freq: Once | INTRAMUSCULAR | Status: AC
  Administered 2013-09-29: 1 mg via INTRAVENOUS
  Filled 2013-09-29: qty 1

## 2013-09-29 MED ORDER — DOXYCYCLINE HYCLATE 100 MG PO CAPS: 100.0000 mg | ORAL_CAPSULE | Freq: Two times a day (BID) | ORAL | Status: DC

## 2013-09-29 MED ORDER — PROCHLORPERAZINE EDISYLATE 5 MG/ML IJ SOLN
10.0000 mg | Freq: Once | INTRAMUSCULAR | Status: AC
  Administered 2013-09-29: 10 mg via INTRAVENOUS
  Filled 2013-09-29: qty 2

## 2013-09-29 MED ORDER — OXYCODONE-ACETAMINOPHEN 5-325 MG PO TABS: 1.0000 | ORAL_TABLET | Freq: Four times a day (QID) | ORAL | Status: DC | PRN

## 2013-09-29 MED ORDER — VANCOMYCIN HCL IN DEXTROSE 1-5 GM/200ML-% IV SOLN
1000.0000 mg | Freq: Once | INTRAVENOUS | Status: AC
  Administered 2013-09-29: 1000 mg via INTRAVENOUS
  Filled 2013-09-29: qty 200

## 2013-09-29 NOTE — Discharge Instructions (Signed)
Please see Dr. Arnoldo Morale on Tuesday, July 21. Please use doxycycline 2 times daily with food. Use Tylenol or ibuprofen for mild pain, use Percocet for more severe pain. Percocet may cause drowsiness, and/or constipation. Please use with caution. Cellulitis Cellulitis is an infection of the skin and the tissue under the skin. The infected area is usually red and tender. This happens most often in the arms and lower legs. HOME CARE   Take your antibiotic medicine as told. Finish the medicine even if you start to feel better.  Keep the infected arm or leg raised (elevated).  Put a warm cloth on the area up to 4 times per day.  Only take medicines as told by your doctor.  Keep all doctor visits as told. GET HELP RIGHT AWAY IF:   You have a fever.  You feel very sleepy.  You throw up (vomit) or have watery poop (diarrhea).  You feel sick and have muscle aches and pains.  You see red streaks on the skin coming from the infected area.  Your red area gets bigger or turns a dark color.  Your bone or joint under the infected area is painful after the skin heals.  Your infection comes back in the same area or different area.  You have a puffy (swollen) bump in the infected area.  You have new symptoms. MAKE SURE YOU:   Understand these instructions.  Will watch your condition.  Will get help right away if you are not doing well or get worse. Document Released: 08/21/2007 Document Revised: 09/03/2011 Document Reviewed: 05/20/2011 [facility] Patient Information 2015 [place], [place]. This information is not intended to replace advice given to you by your health care provider. Make sure you discuss any questions you have with your health care provider.

## 2013-09-29 NOTE — ED Provider Notes (Signed)
CSN: [id number]     Arrival date & time 09/29/13  2536 History   First MD Initiated Contact with Patient 09/29/13 [phone]     Chief Complaint  Patient presents with  . Abdominal Pain     (Consider location/radiation/quality/duration/timing/severity/associated sxs/prior Treatment) HPI Comments: Patient is a [age]-year-old female who presents to the emergency department with complaint of abdominal pain. The patient has a history of multiple abdominal surgeries. She has developed an area of abscess/cellulitis in the abdominal wall. She has been seen on several locations urine emergency department for this problem. She is followed by Dr. Arnoldo Morale for this problem as well. She was recently treated with doxycycline. The patient states that she finished this but she again began having problems with pain and discomfort. The patient states that she is also bothered by vomiting on daily basis. This is not a new problem for her. She states that she has an open area that is oozing at times clear material, and other times of pus like drainage. She denies any known fevers. She presents now for assistance with her pain and with her the possible infection.  Patient is a 48 y.o. female presenting with abdominal pain.  Abdominal Pain Associated symptoms: nausea and vomiting   Associated symptoms: no chest pain, no cough, no dysuria, no hematuria and no shortness of breath     Past Medical History  Diagnosis Date  . Lupus   . 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 Hypertension   . Abscess     soft tissue  . Suicide attempt   . Blood transfusion without reported diagnosis    Past Surgical History  Procedure Laterality Date  . Abdominal surgery    . Abdominal hysterectomy  2013    [place]  . Debridement of abdominal wall abscess N/A 02/08/2013    Procedure: DEBRIDEMENT OF ABDOMINAL WALL ABSCESS;  Surgeon: Jamesetta So, MD;  Location: [facility];  Service: General;  Laterality: N/A;  . Gastric  bypass       [place], first 2000, 2005/2006.  . Colonoscopy      in [place]  . Cholecystectomy    . Colonoscopy with propofol N/A 05/20/2013    Procedure: COLONOSCOPY WITH PROPOFOL (Procedure #2) At cecum@0957  total withdrawal time=;  Surgeon: Daneil Dolin, MD;  Location: [facility];  Service: Endoscopy;  Laterality: N/A;  . Esophagogastroduodenoscopy (egd) with propofol N/A 05/20/2013    Procedure: ESOPHAGOGASTRODUODENOSCOPY (EGD) WITH PROPOFOL (Procedure #1);  Surgeon: Daneil Dolin, MD;  Location: [facility];  Service: Endoscopy;  Laterality: N/A;  . Esophageal biopsy  05/20/2013    Procedure: BIOPSIES OF ASCENDING AND SIGMOID COLON;  Surgeon: Daneil Dolin, MD;  Location: [facility];  Service: Endoscopy;;  . Wound exploration Right 06/24/2013    Procedure: exploration of traumatic wound right wrist;  Surgeon: Tennis Must, MD;  Location: [facility];  Service: Orthopedics;  Laterality: Right;  . Tendon repar    . Adrenal turmor removal     Family History  Problem Relation Age of Onset  . Brain cancer Son   . Schizophrenia Son   . Cancer Son     brain  . Breast cancer Maternal Aunt   . Bipolar disorder Maternal Aunt   . Drug abuse Maternal Aunt   . Colon cancer Maternal Grandmother     late 61s, early 53s  . Lung cancer Father   . Cancer Father     mets  . Liver disease Neg Hx   .  Drug abuse Mother   . Drug abuse Sister   . Drug abuse Brother   . Bipolar disorder Paternal Grandfather   . Bipolar disorder Cousin    History  Substance Use Topics  . Smoking status: Current Every Day Smoker -- 0.50 packs/day for 35 years    Types: Cigarettes  . Smokeless tobacco: Never Used  . Alcohol Use: Yes     Comment: beer  often in morning. states cut back lately.    OB History   Grav Para Term Preterm Abortions TAB SAB Ect Mult Living                 Review of Systems  Constitutional: Negative for activity change.       All ROS Neg except as noted in HPI  Eyes: Negative for photophobia and  discharge.  Respiratory: Negative for cough, shortness of breath and wheezing.   Cardiovascular: Negative for chest pain and palpitations.  Gastrointestinal: Positive for nausea, vomiting and abdominal pain. Negative for blood in stool.  Genitourinary: Negative for dysuria, frequency and hematuria.  Musculoskeletal: Negative for arthralgias, back pain and neck pain.  Skin: Positive for wound.  Neurological: Negative for dizziness, seizures and speech difficulty.  Psychiatric/Behavioral: Positive for decreased concentration. Negative for suicidal ideas, hallucinations, confusion and self-injury. The patient is nervous/anxious.       Allergies  Codeine and Morphine and related  Home Medications   Prior to Admission medications   Medication Sig Start Date End Date Taking? Authorizing Provider  ARIPiprazole (ABILIFY) 5 MG tablet Take 1 tablet (5 mg total) by mouth 2 (two) times daily. 07/16/13   Levonne Spiller, MD  doxycycline (VIBRAMYCIN) 100 MG capsule Take 1 capsule (100 mg total) by mouth 2 (two) times daily. For 10 days 09/16/13   Tammy L. Triplett, PA-C  escitalopram (LEXAPRO) 20 MG tablet Take 1 tablet (20 mg total) by mouth daily. 07/16/13 07/16/14  Levonne Spiller, MD  fluticasone (FLONASE) 50 MCG/ACT nasal spray Place 1 spray into both nostrils daily. 07/03/13   Geradine Girt, DO  ibuprofen (ADVIL,MOTRIN) 200 MG tablet Take 200 mg by mouth 2 (two) times daily as needed for moderate pain.    Historical Provider, MD  lisinopril (PRINIVIL,ZESTRIL) 10 MG tablet Take 10 mg by mouth daily.    Historical Provider, MD  oxyCODONE-acetaminophen (PERCOCET/ROXICET) 5-325 MG per tablet Take 1 tablet by mouth every 4 (four) hours as needed. 09/16/13   Tammy L. Triplett, PA-C  promethazine (PHENERGAN) 25 MG tablet Take 1 tablet (25 mg total) by mouth every 6 (six) hours as needed for nausea or vomiting. 09/16/13   Tammy L. Triplett, PA-C  sulfamethoxazole-trimethoprim (BACTRIM DS) 800-160 MG per tablet Take 1  tablet by mouth daily. 07/27/13   Carlyle Basques, MD  zolpidem (AMBIEN CR) 12.5 MG CR tablet Take 1 tablet (12.5 mg total) by mouth at bedtime as needed for sleep. 07/16/13   Levonne Spiller, MD   BP 129/95  Temp(Src) 97.9 F (36.6 C) (Oral)  Ht 5\' 3"  (1.6 m)  Wt 131 lb (59.421 kg)  BMI 23.21 kg/m2  SpO2 100% Physical Exam  Nursing note and vitals reviewed. Constitutional: She is oriented to person, place, and time. She appears well-developed and well-nourished.  Non-toxic appearance.  HENT:  Head: Normocephalic.  Right Ear: Tympanic membrane and external ear normal.  Left Ear: Tympanic membrane and external ear normal.  Eyes: EOM and lids are normal. Pupils are equal, round, and reactive to light.  Neck: Normal range  of motion. Neck supple. Carotid bruit is not present.  Cardiovascular: Normal rate, regular rhythm, normal heart sounds, intact distal pulses and normal pulses.   Pulmonary/Chest: Breath sounds normal. No respiratory distress.  Abdominal: Soft. Bowel sounds are normal. There is no tenderness. There is no guarding.  Multiple surgical scars. Small scabbed area from a previous abscess area. Small area of increased redness near the umbilicus. No hot areas. Mild to mod swelling on the left abd. Tender to palpation of the left abd. BS active.  Musculoskeletal: Normal range of motion.  Lymphadenopathy:       Head (right side): No submandibular adenopathy present.       Head (left side): No submandibular adenopathy present.    She has no cervical adenopathy.  Neurological: She is alert and oriented to person, place, and time. She has normal strength. No cranial nerve deficit or sensory deficit.  Skin: Skin is warm and dry.  Psychiatric: She has a normal mood and affect. Her speech is normal.    ED Course  Procedures (including critical care time) Labs Review Labs Reviewed  CBC WITH DIFFERENTIAL  BASIC METABOLIC PANEL  LIPASE, BLOOD  HEPATIC FUNCTION PANEL    Imaging  Review No results found.   EKG Interpretation None      MDM   complete blood count is within normal limits. Basic metabolic panel nonacute. Lipase and hepatic function panel nonacute.  Case discussed with Dr. Arnoldo Morale. He will see the patient in the office on Tuesday, July 21. Prescription for Percocet one every 6 hours given to the patient. Patient will be treated with doxycycline one tablet 2 times daily.     Final diagnoses:  None    *I have reviewed nursing notes, vital signs, and all appropriate lab and imaging results for this patient.Lenox Ahr, PA-C 10/03/13 2101

## 2013-09-29 NOTE — ED Notes (Signed)
Abdominal pain started 09/27/13 constant sharp burning worse with movement. States she has been vomiting on a daily basis last couple of yrs, however has vomited around 20 times since 0400. Denies blood in vomiting. Small round wound, pencil tip size on patients lower right abdominal from previous surgery couple yrs ago, open oozing small amount of serous draining. Per patient this wound has never closed. Patient post op gastric bypass 2005 and abdominal abscess debridement November 2014.

## 2013-10-05 DIAGNOSIS — L03319 Cellulitis of trunk, unspecified: Secondary | ICD-10-CM | POA: Diagnosis not present

## 2013-10-05 DIAGNOSIS — L02219 Cutaneous abscess of trunk, unspecified: Secondary | ICD-10-CM | POA: Diagnosis not present

## 2013-10-11 NOTE — ED Provider Notes (Signed)
Medical screening examination/treatment/procedure(s) were performed by non-physician practitioner and as supervising physician I was immediately available for consultation/collaboration.   EKG Interpretation None        Maudry Diego, MD 10/11/13 [phone]

## 2013-10-14 ENCOUNTER — Ambulatory Visit: Payer: Self-pay | Admitting: Gastroenterology

## 2013-10-14 ENCOUNTER — Encounter: Payer: Self-pay | Admitting: Gastroenterology

## 2013-10-14 ENCOUNTER — Telehealth: Payer: Self-pay | Admitting: Gastroenterology

## 2013-10-14 NOTE — Telephone Encounter (Signed)
Mailed letter °

## 2013-10-14 NOTE — Telephone Encounter (Signed)
Pt was a no show

## 2013-11-16 ENCOUNTER — Encounter: Payer: Self-pay | Admitting: Internal Medicine

## 2013-11-16 ENCOUNTER — Telehealth: Payer: Self-pay | Admitting: Internal Medicine

## 2013-11-16 NOTE — Telephone Encounter (Signed)
PATIENT ON RECALL LIST FOR September AND WILL ALSO NEED LFT'S

## 2013-11-24 ENCOUNTER — Other Ambulatory Visit: Payer: Self-pay

## 2013-11-24 ENCOUNTER — Other Ambulatory Visit: Payer: Self-pay | Admitting: Internal Medicine

## 2013-11-24 DIAGNOSIS — R945 Abnormal results of liver function studies: Secondary | ICD-10-CM

## 2013-11-24 DIAGNOSIS — R7989 Other specified abnormal findings of blood chemistry: Secondary | ICD-10-CM

## 2013-11-24 NOTE — Telephone Encounter (Signed)
Letter and lab order have been mailed to the pt. 

## 2013-12-08 ENCOUNTER — Emergency Department (HOSPITAL_COMMUNITY): Payer: Medicare Other

## 2013-12-08 ENCOUNTER — Encounter (HOSPITAL_COMMUNITY): Payer: Self-pay | Admitting: Emergency Medicine

## 2013-12-08 ENCOUNTER — Emergency Department (HOSPITAL_COMMUNITY)
Admission: EM | Admit: 2013-12-08 | Discharge: 2013-12-08 | Disposition: A | Discharge status: Home / Self Care | Payer: Medicare Other | Source: Home / Self Care | Attending: Emergency Medicine | Admitting: Emergency Medicine

## 2013-12-08 DIAGNOSIS — T8140XA Infection following a procedure, unspecified, initial encounter: Secondary | ICD-10-CM

## 2013-12-08 DIAGNOSIS — A088 Other specified intestinal infections: Principal | ICD-10-CM | POA: Diagnosis present

## 2013-12-08 DIAGNOSIS — S31109D Unspecified open wound of abdominal wall, unspecified quadrant without penetration into peritoneal cavity, subsequent encounter: Principal | ICD-10-CM

## 2013-12-08 DIAGNOSIS — Z808 Family history of malignant neoplasm of other organs or systems: Secondary | ICD-10-CM | POA: Diagnosis not present

## 2013-12-08 DIAGNOSIS — Z801 Family history of malignant neoplasm of trachea, bronchus and lung: Secondary | ICD-10-CM

## 2013-12-08 DIAGNOSIS — Z8739 Personal history of other diseases of the musculoskeletal system and connective tissue: Secondary | ICD-10-CM

## 2013-12-08 DIAGNOSIS — Z792 Long term (current) use of antibiotics: Secondary | ICD-10-CM | POA: Insufficient documentation

## 2013-12-08 DIAGNOSIS — Z9884 Bariatric surgery status: Secondary | ICD-10-CM

## 2013-12-08 DIAGNOSIS — F17200 Nicotine dependence, unspecified, uncomplicated: Secondary | ICD-10-CM | POA: Insufficient documentation

## 2013-12-08 DIAGNOSIS — L03319 Cellulitis of trunk, unspecified: Secondary | ICD-10-CM

## 2013-12-08 DIAGNOSIS — Y838 Other surgical procedures as the cause of abnormal reaction of the patient, or of later complication, without mention of misadventure at the time of the procedure: Secondary | ICD-10-CM

## 2013-12-08 DIAGNOSIS — E8889 Other specified metabolic disorders: Secondary | ICD-10-CM

## 2013-12-08 DIAGNOSIS — L02219 Cutaneous abscess of trunk, unspecified: Secondary | ICD-10-CM | POA: Diagnosis present

## 2013-12-08 DIAGNOSIS — L089 Local infection of the skin and subcutaneous tissue, unspecified: Secondary | ICD-10-CM

## 2013-12-08 DIAGNOSIS — Z79899 Other long term (current) drug therapy: Secondary | ICD-10-CM

## 2013-12-08 DIAGNOSIS — Z803 Family history of malignant neoplasm of breast: Secondary | ICD-10-CM

## 2013-12-08 DIAGNOSIS — E872 Acidosis, unspecified: Secondary | ICD-10-CM | POA: Insufficient documentation

## 2013-12-08 DIAGNOSIS — F259 Schizoaffective disorder, unspecified: Secondary | ICD-10-CM | POA: Diagnosis present

## 2013-12-08 DIAGNOSIS — I10 Essential (primary) hypertension: Secondary | ICD-10-CM | POA: Diagnosis present

## 2013-12-08 DIAGNOSIS — Z5189 Encounter for other specified aftercare: Secondary | ICD-10-CM | POA: Diagnosis not present

## 2013-12-08 DIAGNOSIS — E876 Hypokalemia: Secondary | ICD-10-CM | POA: Diagnosis present

## 2013-12-08 DIAGNOSIS — G8929 Other chronic pain: Secondary | ICD-10-CM

## 2013-12-08 DIAGNOSIS — M329 Systemic lupus erythematosus, unspecified: Secondary | ICD-10-CM | POA: Diagnosis not present

## 2013-12-08 DIAGNOSIS — R197 Diarrhea, unspecified: Secondary | ICD-10-CM | POA: Insufficient documentation

## 2013-12-08 DIAGNOSIS — D649 Anemia, unspecified: Secondary | ICD-10-CM | POA: Diagnosis present

## 2013-12-08 DIAGNOSIS — Z800 Family history of malignant neoplasm of digestive organs: Secondary | ICD-10-CM

## 2013-12-08 DIAGNOSIS — Z6379 Other stressful life events affecting family and household: Secondary | ICD-10-CM | POA: Diagnosis not present

## 2013-12-08 DIAGNOSIS — Z9089 Acquired absence of other organs: Secondary | ICD-10-CM | POA: Diagnosis not present

## 2013-12-08 DIAGNOSIS — Z818 Family history of other mental and behavioral disorders: Secondary | ICD-10-CM | POA: Diagnosis not present

## 2013-12-08 DIAGNOSIS — R109 Unspecified abdominal pain: Secondary | ICD-10-CM

## 2013-12-08 DIAGNOSIS — K2970 Gastritis, unspecified, without bleeding: Secondary | ICD-10-CM | POA: Diagnosis not present

## 2013-12-08 DIAGNOSIS — K5289 Other specified noninfective gastroenteritis and colitis: Secondary | ICD-10-CM | POA: Diagnosis not present

## 2013-12-08 DIAGNOSIS — Z9889 Other specified postprocedural states: Secondary | ICD-10-CM | POA: Insufficient documentation

## 2013-12-08 DIAGNOSIS — Z90710 Acquired absence of both cervix and uterus: Secondary | ICD-10-CM

## 2013-12-08 DIAGNOSIS — R112 Nausea with vomiting, unspecified: Secondary | ICD-10-CM | POA: Insufficient documentation

## 2013-12-08 DIAGNOSIS — S31109A Unspecified open wound of abdominal wall, unspecified quadrant without penetration into peritoneal cavity, initial encounter: Secondary | ICD-10-CM | POA: Diagnosis not present

## 2013-12-08 DIAGNOSIS — R141 Gas pain: Secondary | ICD-10-CM | POA: Diagnosis not present

## 2013-12-08 DIAGNOSIS — F319 Bipolar disorder, unspecified: Secondary | ICD-10-CM | POA: Diagnosis not present

## 2013-12-08 LAB — BASIC METABOLIC PANEL
Anion gap: 19 — ABNORMAL HIGH (ref 5–15)
BUN: 5 mg/dL — AB (ref 6–23)
CALCIUM: 8.9 mg/dL (ref 8.4–10.5)
CO2: 24 meq/L (ref 19–32)
Chloride: 94 mEq/L — ABNORMAL LOW (ref 96–112)
Creatinine, Ser: 0.57 mg/dL (ref 0.50–1.10)
GFR calc Af Amer: >90 mL/min (ref >90)
GFR calc non Af Amer: >90 mL/min (ref >90)
GLUCOSE: 83 mg/dL (ref 70–99)
Potassium: 4.9 mEq/L (ref 3.7–5.3)
SODIUM: 137 meq/L (ref 137–147)

## 2013-12-08 LAB — COMPREHENSIVE METABOLIC PANEL
ALT: 41 U/L — AB (ref 0–35)
AST: 66 U/L — AB (ref 0–37)
Albumin: 4.3 g/dL (ref 3.5–5.2)
Alkaline Phosphatase: 96 U/L (ref 39–117)
Anion gap: 27 — ABNORMAL HIGH (ref 5–15)
BUN: 5 mg/dL — ABNORMAL LOW (ref 6–23)
CHLORIDE: 91 meq/L — AB (ref 96–112)
CO2: 21 meq/L (ref 19–32)
Calcium: 9.5 mg/dL (ref 8.4–10.5)
Creatinine, Ser: 0.54 mg/dL (ref 0.50–1.10)
GFR calc Af Amer: >90 mL/min (ref >90)
GFR calc non Af Amer: >90 mL/min (ref >90)
Glucose, Bld: 98 mg/dL (ref 70–99)
Potassium: 3.8 mEq/L (ref 3.7–5.3)
SODIUM: 139 meq/L (ref 137–147)
Total Bilirubin: 0.9 mg/dL (ref 0.3–1.2)
Total Protein: 8.4 g/dL — ABNORMAL HIGH (ref 6.0–8.3)

## 2013-12-08 LAB — CBC WITH DIFFERENTIAL/PLATELET
BASOS ABS: 0 10*3/uL (ref 0.0–0.1)
Basophils Relative: 0 % (ref 0–1)
Eosinophils Absolute: 0 10*3/uL (ref 0.0–0.7)
Eosinophils Relative: 0 % (ref 0–5)
HEMATOCRIT: 38.1 % (ref 36.0–46.0)
Hemoglobin: 13.7 g/dL (ref 12.0–15.0)
LYMPHS PCT: 9 % — AB (ref 12–46)
Lymphs Abs: 0.6 10*3/uL — ABNORMAL LOW (ref 0.7–4.0)
MCH: 31 pg (ref 26.0–34.0)
MCHC: 36 g/dL (ref 30.0–36.0)
MCV: 86.2 fL (ref 78.0–100.0)
MONO ABS: 0.8 10*3/uL (ref 0.1–1.0)
Monocytes Relative: 12 % (ref 3–12)
NEUTROS ABS: 5.1 10*3/uL (ref 1.7–7.7)
Neutrophils Relative %: 79 % — ABNORMAL HIGH (ref 43–77)
PLATELETS: 374 10*3/uL (ref 150–400)
RBC: 4.42 MIL/uL (ref 3.87–5.11)
RDW: 15.2 % (ref 11.5–15.5)
WBC: 6.5 10*3/uL (ref 4.0–10.5)

## 2013-12-08 LAB — URINALYSIS, ROUTINE W REFLEX MICROSCOPIC
GLUCOSE, UA: NEGATIVE mg/dL
HGB URINE DIPSTICK: NEGATIVE
Leukocytes, UA: NEGATIVE
Nitrite: NEGATIVE
Specific Gravity, Urine: 1.02 (ref 1.005–1.030)
Urobilinogen, UA: 0.2 mg/dL (ref 0.0–1.0)
pH: 5.5 (ref 5.0–8.0)

## 2013-12-08 LAB — RAPID URINE DRUG SCREEN, HOSP PERFORMED
Amphetamines: NOT DETECTED
Barbiturates: NOT DETECTED
Benzodiazepines: NOT DETECTED
COCAINE: NOT DETECTED
Opiates: NOT DETECTED
Tetrahydrocannabinol: NOT DETECTED

## 2013-12-08 LAB — I-STAT CG4 LACTIC ACID, ED: LACTIC ACID, VENOUS: 0.95 mmol/L (ref 0.5–2.2)

## 2013-12-08 LAB — URINE MICROSCOPIC-ADD ON

## 2013-12-08 LAB — LIPASE, BLOOD: Lipase: 12 U/L (ref 11–59)

## 2013-12-08 LAB — ETHANOL: Alcohol, Ethyl (B): <11 mg/dL (ref 0–11)

## 2013-12-08 MED ORDER — PROMETHAZINE HCL 25 MG/ML IJ SOLN
INTRAMUSCULAR | Status: AC
  Filled 2013-12-08: qty 1

## 2013-12-08 MED ORDER — HYDROMORPHONE HCL 1 MG/ML IJ SOLN
1.0000 mg | Freq: Once | INTRAMUSCULAR | Status: AC
  Administered 2013-12-08: 1 mg via INTRAVENOUS
  Filled 2013-12-08: qty 1

## 2013-12-08 MED ORDER — SODIUM CHLORIDE 0.9 % IV BOLUS (SEPSIS)
1000.0000 mL | Freq: Once | INTRAVENOUS | Status: AC
  Administered 2013-12-08: 1000 mL via INTRAVENOUS

## 2013-12-08 MED ORDER — ONDANSETRON HCL 4 MG/2ML IJ SOLN
4.0000 mg | Freq: Once | INTRAMUSCULAR | Status: AC
  Administered 2013-12-08: 4 mg via INTRAVENOUS
  Filled 2013-12-08: qty 2

## 2013-12-08 MED ORDER — ONDANSETRON HCL 4 MG PO TABS: 4.0000 mg | ORAL_TABLET | Freq: Four times a day (QID) | ORAL | Status: DC

## 2013-12-08 MED ORDER — IOHEXOL 300 MG/ML  SOLN
100.0000 mL | Freq: Once | INTRAMUSCULAR | Status: AC | PRN
  Administered 2013-12-08: 100 mL via INTRAVENOUS

## 2013-12-08 MED ORDER — DOXYCYCLINE HYCLATE 100 MG PO CAPS: 100.0000 mg | ORAL_CAPSULE | Freq: Two times a day (BID) | ORAL | Status: DC

## 2013-12-08 MED ORDER — PROMETHAZINE HCL 25 MG/ML IJ SOLN
25.0000 mg | Freq: Once | INTRAMUSCULAR | Status: AC
  Administered 2013-12-08: 25 mg via INTRAVENOUS

## 2013-12-08 NOTE — ED Notes (Signed)
Pt given water to drink for fluid challenge 

## 2013-12-08 NOTE — ED Notes (Signed)
N/v/d, lower abd pain at incision site since yesterday.  Pt reports had abscess surgery x 1 year ago and area keeps busting open with drainage and pain.

## 2013-12-08 NOTE — ED Notes (Signed)
Notified Dr. Wyvonnia Dusky that pt was not able to tolerate po contrast. Orders given for Phenergan IV and he stated that it was ok if pt cannot drink po contrast. Radiology notified.

## 2013-12-08 NOTE — ED Notes (Signed)
Patient verbalizes understanding of discharge instructions, prescription medications and follow up care. Patient ambulatory out of department at this time

## 2013-12-08 NOTE — Discharge Instructions (Signed)
Nausea and Vomiting Reduced your alcohol intake. Follow up with Dr. Arnoldo Morale. Return to the ED if you develop new or worsening symptoms. Nausea is a sick feeling that often comes before throwing up (vomiting). Vomiting is a reflex where stomach contents come out of your mouth. Vomiting can cause severe loss of body fluids (dehydration). Children and elderly adults can become dehydrated quickly, especially if they also have diarrhea. Nausea and vomiting are symptoms of a condition or disease. It is important to find the cause of your symptoms. CAUSES   Direct irritation of the stomach lining. This irritation can result from increased acid production (gastroesophageal reflux disease), infection, food poisoning, taking certain medicines (such as nonsteroidal anti-inflammatory drugs), alcohol use, or tobacco use.  Signals from the brain.These signals could be caused by a headache, heat exposure, an inner ear disturbance, increased pressure in the brain from injury, infection, a tumor, or a concussion, pain, emotional stimulus, or metabolic problems.  An obstruction in the gastrointestinal tract (bowel obstruction).  Illnesses such as diabetes, hepatitis, gallbladder problems, appendicitis, kidney problems, cancer, sepsis, atypical symptoms of a heart attack, or eating disorders.  Medical treatments such as chemotherapy and radiation.  Receiving medicine that makes you sleep (general anesthetic) during surgery. DIAGNOSIS Your caregiver may ask for tests to be done if the problems do not improve after a few days. Tests may also be done if symptoms are severe or if the reason for the nausea and vomiting is not clear. Tests may include:  Urine tests.  Blood tests.  Stool tests.  Cultures (to look for evidence of infection).  X-rays or other imaging studies. Test results can help your caregiver make decisions about treatment or the need for additional tests. TREATMENT You need to stay well  hydrated. Drink frequently but in small amounts.You may wish to drink water, sports drinks, clear broth, or eat frozen ice pops or gelatin dessert to help stay hydrated.When you eat, eating slowly may help prevent nausea.There are also some antinausea medicines that may help prevent nausea. HOME CARE INSTRUCTIONS   Take all medicine as directed by your caregiver.  If you do not have an appetite, do not force yourself to eat. However, you must continue to drink fluids.  If you have an appetite, eat a normal diet unless your caregiver tells you differently.  Eat a variety of complex carbohydrates (rice, wheat, potatoes, bread), lean meats, yogurt, fruits, and vegetables.  Avoid high-fat foods because they are more difficult to digest.  Drink enough water and fluids to keep your urine clear or pale yellow.  If you are dehydrated, ask your caregiver for specific rehydration instructions. Signs of dehydration may include:  Severe thirst.  Dry lips and mouth.  Dizziness.  Dark urine.  Decreasing urine frequency and amount.  Confusion.  Rapid breathing or pulse. SEEK IMMEDIATE MEDICAL CARE IF:   You have blood or brown flecks (like coffee grounds) in your vomit.  You have black or bloody stools.  You have a severe headache or stiff neck.  You are confused.  You have severe abdominal pain.  You have chest pain or trouble breathing.  You do not urinate at least once every 8 hours.  You develop cold or clammy skin.  You continue to vomit for longer than 24 to 48 hours.  You have a fever. MAKE SURE YOU:   Understand these instructions.  Will watch your condition.  Will get help right away if you are not doing well or get worse.  Document Released: 03/04/2005 Document Revised: 05/27/2011 Document Reviewed: 08/01/2010 [facility] Patient Information 2015 [place], [place]. This information is not intended to replace advice given to you by your health care provider. Make  sure you discuss any questions you have with your health care provider.

## 2013-12-08 NOTE — ED Provider Notes (Signed)
CSN: [id number]     Arrival date & time 12/08/13  1851 History   First MD Initiated Contact with Patient 12/08/13 1905     Chief Complaint  Patient presents with  . Emesis     (Consider location/radiation/quality/duration/timing/severity/associated sxs/prior Treatment) HPI Comments: Patient presents with nausea, vomiting and abdominal pain that onset yesterday evening. She's vomited countless times today nonbloody nonbilious. She denies any blood in her stool. She had some loose stools last night but since resolved. She has a history of multiple abdominal surgeries and chronic nonhealing wound that has been draining on her abdomen since November. Denies any change with this. She stated she was not having any pain until she started vomiting. Denies any fever, urinary symptoms. Denies any chest pain or shortness of breath.   The history is provided by the patient and the EMS personnel.    Past Medical History  Diagnosis Date  . Lupus   . 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 Hypertension   . Abscess     soft tissue  . Suicide attempt   . Blood transfusion without reported diagnosis    Past Surgical History  Procedure Laterality Date  . Abdominal surgery    . Abdominal hysterectomy  2013    [place]  . Debridement of abdominal wall abscess N/A 02/08/2013    Procedure: DEBRIDEMENT OF ABDOMINAL WALL ABSCESS;  Surgeon: Jamesetta So, MD;  Location: [facility];  Service: General;  Laterality: N/A;  . Gastric bypass       [place], first 2000, 2005/2006.  . Colonoscopy      in [place]  . Cholecystectomy    . Colonoscopy with propofol N/A 05/20/2013    Dr.Rourk- inadequate prep, normal appearing rectum, grossly normal colon aside from pancolonic diverticula, normal terminal ileum bx= unremarkable colonic mucosa  . Esophagogastroduodenoscopy (egd) with propofol N/A 05/20/2013    Dr.Rourk- s/p prior gastric surgery with normal esophagus, residual gastric mucosa and patent efferent  limb  . Esophageal biopsy  05/20/2013    Procedure: BIOPSIES OF ASCENDING AND SIGMOID COLON;  Surgeon: Daneil Dolin, MD;  Location: [facility];  Service: Endoscopy;;  . Wound exploration Right 06/24/2013    Procedure: exploration of traumatic wound right wrist;  Surgeon: Tennis Must, MD;  Location: [facility];  Service: Orthopedics;  Laterality: Right;  . Tendon repar    . Adrenal turmor removal     Family History  Problem Relation Age of Onset  . Brain cancer Son   . Schizophrenia Son   . Cancer Son     brain  . Breast cancer Maternal Aunt   . Bipolar disorder Maternal Aunt   . Drug abuse Maternal Aunt   . Colon cancer Maternal Grandmother     late 27s, early 55s  . Lung cancer Father   . Cancer Father     mets  . Liver disease Neg Hx   . Drug abuse Mother   . Drug abuse Sister   . Drug abuse Brother   . Bipolar disorder Paternal Grandfather   . Bipolar disorder Cousin    History  Substance Use Topics  . Smoking status: Current Every Day Smoker -- 0.50 packs/day for 35 years    Types: Cigarettes  . Smokeless tobacco: Never Used  . Alcohol Use: Yes     Comment: daily   OB History   Grav Para Term Preterm Abortions TAB SAB Ect Mult Living  Review of Systems  Constitutional: Positive for activity change and appetite change. Negative for fever.  HENT: Negative for congestion and rhinorrhea.   Respiratory: Negative for cough, chest tightness and shortness of breath.   Cardiovascular: Negative for chest pain.  Gastrointestinal: Positive for nausea, vomiting, abdominal pain and diarrhea.  Genitourinary: Negative for dysuria, urgency, vaginal bleeding and vaginal discharge.  Musculoskeletal: Negative for arthralgias, back pain and myalgias.  Skin: Negative for rash.  Neurological: Negative for dizziness, weakness, light-headedness and headaches.  A complete 10 system review of systems was obtained and all systems are negative except as noted in the HPI and PMH.       Allergies  Codeine and Morphine and related  Home Medications   Prior to Admission medications   Medication Sig Start Date End Date Taking? Authorizing Provider  ARIPiprazole (ABILIFY) 5 MG tablet Take 1 tablet (5 mg total) by mouth 2 (two) times daily. 07/16/13  Yes Levonne Spiller, MD  escitalopram (LEXAPRO) 20 MG tablet Take 1 tablet (20 mg total) by mouth daily. 07/16/13 07/16/14 Yes Levonne Spiller, MD  ibuprofen (ADVIL,MOTRIN) 200 MG tablet Take 400 mg by mouth 2 (two) times daily as needed for moderate pain.    Yes Historical Provider, MD  lisinopril (PRINIVIL,ZESTRIL) 10 MG tablet Take 10 mg by mouth daily.   Yes Historical Provider, MD  doxycycline (VIBRAMYCIN) 100 MG capsule Take 1 capsule (100 mg total) by mouth 2 (two) times daily. 12/08/13   Ezequiel Essex, MD  ondansetron (ZOFRAN) 4 MG tablet Take 1 tablet (4 mg total) by mouth every 6 (six) hours. 12/08/13   Ezequiel Essex, MD   BP 134/86  Pulse 94  Temp(Src) 99 F (37.2 C) (Oral)  Resp 16  Ht 5\' 3"  (1.6 m)  Wt 132 lb (59.875 kg)  BMI 23.39 kg/m2  SpO2 100% Physical Exam  Nursing note and vitals reviewed. Constitutional: She is oriented to person, place, and time. She appears well-developed and well-nourished. No distress.  HENT:  Head: Normocephalic and atraumatic.  Mouth/Throat: Oropharynx is clear and moist. No oropharyngeal exudate.  Eyes: Conjunctivae and EOM are normal. Pupils are equal, round, and reactive to light.  Neck: Normal range of motion. Neck supple.  No meningismus.  Cardiovascular: Normal rate, regular rhythm, normal heart sounds and intact distal pulses.   No murmur heard. Pulmonary/Chest: Effort normal and breath sounds normal. No respiratory distress.  Abdominal: Soft. There is tenderness. There is no rebound and no guarding.  Midline surgical incision. At the inferior aspect of the wound there is some gray foul-smelling discharge. No surrounding cellulitis. No fluctuance. Abdomen soft without  peritoneal signs.  Musculoskeletal: Normal range of motion. She exhibits no edema and no tenderness.  No CVA tenderness  Neurological: She is alert and oriented to person, place, and time. No cranial nerve deficit. She exhibits normal muscle tone. Coordination normal.  No ataxia on finger to nose bilaterally. No pronator drift. 5/5 strength throughout. CN 2-12 intact. Negative Romberg. Equal grip strength. Sensation intact. Gait is normal.   Skin: Skin is warm.  Psychiatric: She has a normal mood and affect. Her behavior is normal.    ED Course  Procedures (including critical care time) Labs Review Labs Reviewed  CBC WITH DIFFERENTIAL - Abnormal; Notable for the following:    Neutrophils Relative % 79 (*)    Lymphocytes Relative 9 (*)    Lymphs Abs 0.6 (*)    All other components within normal limits  COMPREHENSIVE METABOLIC PANEL - Abnormal; Notable  for the following:    Chloride 91 (*)    BUN 5 (*)    Total Protein 8.4 (*)    AST 66 (*)    ALT 41 (*)    Anion gap 27 (*)    All other components within normal limits  URINALYSIS, ROUTINE W REFLEX MICROSCOPIC - Abnormal; Notable for the following:    Bilirubin Urine MODERATE (*)    Ketones, ur >80 (*)    Protein, ur TRACE (*)    All other components within normal limits  URINE MICROSCOPIC-ADD ON - Abnormal; Notable for the following:    Squamous Epithelial / LPF MANY (*)    Casts HYALINE CASTS (*)    All other components within normal limits  WOUND CULTURE  LIPASE, BLOOD  URINE RAPID DRUG SCREEN (HOSP PERFORMED)  ETHANOL  BASIC METABOLIC PANEL  I-STAT CG4 LACTIC ACID, ED    Imaging Review Ct Abdomen Pelvis W Contrast  12/08/2013   CLINICAL DATA:  Draining abdominal wound.  EXAM: CT ABDOMEN AND PELVIS WITH CONTRAST  TECHNIQUE: Multidetector CT imaging of the abdomen and pelvis was performed using the standard protocol following bolus administration of intravenous contrast.  CONTRAST:  165mL OMNIPAQUE IOHEXOL 300 MG/ML  SOLN   COMPARISON:  06/06/2013.  FINDINGS: Lower chest: The lung bases are clear. No pleural effusion. The heart is normal in size. No pericardial effusion. The distal esophagus is grossly normal. Surgical changes are noted near the GE junction.  Hepatobiliary: Diffuse fatty infiltration of the liver but no focal hepatic lesions or intrahepatic biliary dilatation. The gallbladder is surgically absent. There is stable moderate common bile duct dilatation.  Spleen: Normal size.  No focal lesions.  Pancreas: Normal and stable.  Stomach/Bowel: Surgical changes from gastric bypass surgery. No complicating features are demonstrated. These small bowel and colon appear stable. Fibrofatty infiltrative changes involving the entire colon consistent with remote inflammatory bowel disease. No active inflammation is demonstrated.  Adrenals/urinary tract: The left adrenal gland adenoma is stable. The right adrenal gland is surgically absent. Both kidneys are unremarkable.  Vascular/Lymphatic: The aorta and branch vessels are patent. The major venous structures are patent. No mesenteric or retroperitoneal mass or adenopathy.  Reproductive: The uterus is surgically absent. No pelvic mass or adenopathy. The bladder appears normal. No inguinal mass or adenopathy.  Musculoskeletal: No significant bony findings.  Other: There is persistent abnormal soft tissue thickening involving the anterior abdominal wall this appears to be the AA thick rim enhancing fluid collection which is likely an abscess draining to the region of the emboli kiss. I do not C8 direct communication with the abdomen or bowel. There is no leaking oral contrast to suggest a fistula but a subtle fistula is possible as several the small bowel loops in the abdomen the contrast in none.  IMPRESSION: Persistent abnormal thick rim enhancing fluid collections in the anterior abdominal wall extending to the skin, likely chronic abscess. No obvious communication with the  intra-abdominal contents.  Surgical changes from gastric bypass surgery. Right adrenalectomy is also noted.  No acute or significant intra abdominal/pelvic findings.  Changes of chronic inflammatory bowel disease involving the colon.   Electronically Signed   By: Kalman Jewels M.D.   On: 12/08/2013 21:17     EKG Interpretation None      MDM   Final diagnoses:  Chronic abdominal wound infection, subsequent encounter  Alcoholic ketosis   nausea, vomiting, abdominal pain, history of nonhealing surgical wound.  Labs are at baseline with  evidence of increased AG. CT scan shows chronic fluid collections anterior abdominal wall consistent with chronic abscess.  UA with ketones. No infection. Anion gap of 27 on chemistry. Lactate normal. Patient given IV fluids. Suspect alcoholic starvation ketosis  Discussed with Dr. Arnoldo Morale who is very familiar with patient. He feels her abdominal wound can be followed as an outpatient. He has operated on her multiple times and referred her to plastic surgery. Will treat with doxycycline.  Heart rate and blood pressure improved. Patient is tolerating by mouth. Denies any recent cocaine use. Admits to ongoing alcohol abuse.  Counseled on cessation. Will recheck chemistry after hydration to insure that anion gap is improving. Care signed out to Dr. Stark Jock.   BP 134/86  Pulse 94  Temp(Src) 99 F (37.2 C) (Oral)  Resp 16  Ht 5\' 3"  (1.6 m)  Wt 132 lb (59.875 kg)  BMI 23.39 kg/m2  SpO2 100%  BP 134/86  Pulse 94  Temp(Src) 99 F (37.2 C) (Oral)  Resp 16  Ht 5\' 3"  (1.6 m)  Wt 132 lb (59.875 kg)  BMI 23.39 kg/m2  SpO2 100%   Ezequiel Essex, MD 12/08/13 2255

## 2013-12-10 ENCOUNTER — Inpatient Hospital Stay (HOSPITAL_COMMUNITY)
Admission: EM | Admit: 2013-12-10 | Discharge: 2013-12-13 | DRG: 392 | Disposition: A | Discharge status: Home / Self Care | Payer: Medicare Other | Attending: Internal Medicine | Admitting: Internal Medicine

## 2013-12-10 ENCOUNTER — Emergency Department (HOSPITAL_COMMUNITY): Payer: Medicare Other

## 2013-12-10 ENCOUNTER — Encounter (HOSPITAL_COMMUNITY): Payer: Self-pay | Admitting: Emergency Medicine

## 2013-12-10 DIAGNOSIS — R197 Diarrhea, unspecified: Secondary | ICD-10-CM

## 2013-12-10 DIAGNOSIS — F17200 Nicotine dependence, unspecified, uncomplicated: Secondary | ICD-10-CM | POA: Diagnosis present

## 2013-12-10 DIAGNOSIS — Z5189 Encounter for other specified aftercare: Secondary | ICD-10-CM | POA: Diagnosis not present

## 2013-12-10 DIAGNOSIS — F1010 Alcohol abuse, uncomplicated: Secondary | ICD-10-CM

## 2013-12-10 DIAGNOSIS — E278 Other specified disorders of adrenal gland: Secondary | ICD-10-CM

## 2013-12-10 DIAGNOSIS — F259 Schizoaffective disorder, unspecified: Secondary | ICD-10-CM | POA: Diagnosis present

## 2013-12-10 DIAGNOSIS — F319 Bipolar disorder, unspecified: Secondary | ICD-10-CM

## 2013-12-10 DIAGNOSIS — Z818 Family history of other mental and behavioral disorders: Secondary | ICD-10-CM | POA: Diagnosis not present

## 2013-12-10 DIAGNOSIS — F609 Personality disorder, unspecified: Secondary | ICD-10-CM

## 2013-12-10 DIAGNOSIS — R109 Unspecified abdominal pain: Secondary | ICD-10-CM | POA: Diagnosis not present

## 2013-12-10 DIAGNOSIS — R945 Abnormal results of liver function studies: Secondary | ICD-10-CM

## 2013-12-10 DIAGNOSIS — M329 Systemic lupus erythematosus, unspecified: Secondary | ICD-10-CM | POA: Diagnosis not present

## 2013-12-10 DIAGNOSIS — K5289 Other specified noninfective gastroenteritis and colitis: Secondary | ICD-10-CM

## 2013-12-10 DIAGNOSIS — E876 Hypokalemia: Secondary | ICD-10-CM | POA: Diagnosis present

## 2013-12-10 DIAGNOSIS — H547 Unspecified visual loss: Secondary | ICD-10-CM

## 2013-12-10 DIAGNOSIS — L089 Local infection of the skin and subcutaneous tissue, unspecified: Secondary | ICD-10-CM | POA: Diagnosis not present

## 2013-12-10 DIAGNOSIS — Z9884 Bariatric surgery status: Secondary | ICD-10-CM

## 2013-12-10 DIAGNOSIS — K529 Noninfective gastroenteritis and colitis, unspecified: Secondary | ICD-10-CM | POA: Diagnosis present

## 2013-12-10 DIAGNOSIS — L02211 Cutaneous abscess of abdominal wall: Secondary | ICD-10-CM

## 2013-12-10 DIAGNOSIS — Z720 Tobacco use: Secondary | ICD-10-CM

## 2013-12-10 DIAGNOSIS — Z800 Family history of malignant neoplasm of digestive organs: Secondary | ICD-10-CM | POA: Diagnosis not present

## 2013-12-10 DIAGNOSIS — R519 Headache, unspecified: Secondary | ICD-10-CM

## 2013-12-10 DIAGNOSIS — D649 Anemia, unspecified: Secondary | ICD-10-CM | POA: Diagnosis present

## 2013-12-10 DIAGNOSIS — G479 Sleep disorder, unspecified: Secondary | ICD-10-CM

## 2013-12-10 DIAGNOSIS — I10 Essential (primary) hypertension: Secondary | ICD-10-CM | POA: Diagnosis present

## 2013-12-10 DIAGNOSIS — R7989 Other specified abnormal findings of blood chemistry: Secondary | ICD-10-CM

## 2013-12-10 DIAGNOSIS — R141 Gas pain: Secondary | ICD-10-CM | POA: Diagnosis not present

## 2013-12-10 DIAGNOSIS — Z90710 Acquired absence of both cervix and uterus: Secondary | ICD-10-CM

## 2013-12-10 DIAGNOSIS — Z803 Family history of malignant neoplasm of breast: Secondary | ICD-10-CM | POA: Diagnosis not present

## 2013-12-10 DIAGNOSIS — R112 Nausea with vomiting, unspecified: Secondary | ICD-10-CM | POA: Diagnosis present

## 2013-12-10 DIAGNOSIS — S31109D Unspecified open wound of abdominal wall, unspecified quadrant without penetration into peritoneal cavity, subsequent encounter: Secondary | ICD-10-CM

## 2013-12-10 DIAGNOSIS — Z90721 Acquired absence of ovaries, unilateral: Secondary | ICD-10-CM

## 2013-12-10 DIAGNOSIS — F1411 Cocaine abuse, in remission: Secondary | ICD-10-CM

## 2013-12-10 DIAGNOSIS — L03319 Cellulitis of trunk, unspecified: Secondary | ICD-10-CM

## 2013-12-10 DIAGNOSIS — Z8659 Personal history of other mental and behavioral disorders: Secondary | ICD-10-CM

## 2013-12-10 DIAGNOSIS — S31109A Unspecified open wound of abdominal wall, unspecified quadrant without penetration into peritoneal cavity, initial encounter: Secondary | ICD-10-CM

## 2013-12-10 DIAGNOSIS — Z801 Family history of malignant neoplasm of trachea, bronchus and lung: Secondary | ICD-10-CM | POA: Diagnosis not present

## 2013-12-10 DIAGNOSIS — R002 Palpitations: Secondary | ICD-10-CM

## 2013-12-10 DIAGNOSIS — Z6379 Other stressful life events affecting family and household: Secondary | ICD-10-CM | POA: Diagnosis not present

## 2013-12-10 DIAGNOSIS — L02219 Cutaneous abscess of trunk, unspecified: Secondary | ICD-10-CM

## 2013-12-10 DIAGNOSIS — G8929 Other chronic pain: Secondary | ICD-10-CM | POA: Diagnosis not present

## 2013-12-10 DIAGNOSIS — A088 Other specified intestinal infections: Secondary | ICD-10-CM | POA: Diagnosis not present

## 2013-12-10 DIAGNOSIS — Z808 Family history of malignant neoplasm of other organs or systems: Secondary | ICD-10-CM | POA: Diagnosis not present

## 2013-12-10 DIAGNOSIS — Z9089 Acquired absence of other organs: Secondary | ICD-10-CM | POA: Diagnosis not present

## 2013-12-10 DIAGNOSIS — K625 Hemorrhage of anus and rectum: Secondary | ICD-10-CM

## 2013-12-10 DIAGNOSIS — T8140XA Infection following a procedure, unspecified, initial encounter: Secondary | ICD-10-CM | POA: Diagnosis not present

## 2013-12-10 DIAGNOSIS — R51 Headache: Secondary | ICD-10-CM

## 2013-12-10 HISTORY — DX: Other chronic pain: G89.29

## 2013-12-10 HISTORY — DX: Unspecified open wound of abdominal wall, unspecified quadrant without penetration into peritoneal cavity, initial encounter: S31.109A

## 2013-12-10 HISTORY — DX: Local infection of the skin and subcutaneous tissue, unspecified: L08.9

## 2013-12-10 HISTORY — DX: Unspecified abdominal pain: R10.9

## 2013-12-10 LAB — CBC WITH DIFFERENTIAL/PLATELET
BAND NEUTROPHILS: 0 % (ref 0–10)
Basophils Absolute: 0 10*3/uL (ref 0.0–0.1)
Basophils Relative: 0 % (ref 0–1)
Eosinophils Absolute: 0 10*3/uL (ref 0.0–0.7)
Eosinophils Relative: 1 % (ref 0–5)
HEMATOCRIT: 34.2 % — AB (ref 36.0–46.0)
Hemoglobin: 12 g/dL (ref 12.0–15.0)
Lymphocytes Relative: 34 % (ref 12–46)
Lymphs Abs: 2 10*3/uL (ref 0.7–4.0)
MCH: 30.8 pg (ref 26.0–34.0)
MCHC: 35.1 g/dL (ref 30.0–36.0)
MCV: 87.7 fL (ref 78.0–100.0)
MONOS PCT: 12 % (ref 3–12)
Monocytes Absolute: 1 10*3/uL (ref 0.1–1.0)
Neutro Abs: 3 10*3/uL (ref 1.7–7.7)
Neutrophils Relative %: 53 % (ref 43–77)
Platelets: 279 10*3/uL (ref 150–400)
RBC: 3.9 MIL/uL (ref 3.87–5.11)
RDW: 14.9 % (ref 11.5–15.5)
WBC: 5.7 10*3/uL (ref 4.0–10.5)

## 2013-12-10 LAB — COMPREHENSIVE METABOLIC PANEL
ALT: 36 U/L — ABNORMAL HIGH (ref 0–35)
AST: 69 U/L — ABNORMAL HIGH (ref 0–37)
Albumin: 3.5 g/dL (ref 3.5–5.2)
Alkaline Phosphatase: 88 U/L (ref 39–117)
Anion gap: 14 (ref 5–15)
BUN: 6 mg/dL (ref 6–23)
CO2: 29 mEq/L (ref 19–32)
CREATININE: 0.54 mg/dL (ref 0.50–1.10)
Calcium: 9.2 mg/dL (ref 8.4–10.5)
Chloride: 94 mEq/L — ABNORMAL LOW (ref 96–112)
GFR calc Af Amer: >90 mL/min (ref >90)
GFR calc non Af Amer: >90 mL/min (ref >90)
Glucose, Bld: 85 mg/dL (ref 70–99)
Potassium: 3 mEq/L — ABNORMAL LOW (ref 3.7–5.3)
Sodium: 137 mEq/L (ref 137–147)
Total Bilirubin: 1.1 mg/dL (ref 0.3–1.2)
Total Protein: 7.1 g/dL (ref 6.0–8.3)

## 2013-12-10 LAB — URINE MICROSCOPIC-ADD ON

## 2013-12-10 LAB — URINALYSIS, ROUTINE W REFLEX MICROSCOPIC
GLUCOSE, UA: 100 mg/dL — AB
Ketones, ur: 40 mg/dL — AB
Leukocytes, UA: NEGATIVE
NITRITE: POSITIVE — AB
PH: 6 (ref 5.0–8.0)
Protein, ur: 30 mg/dL — AB
SPECIFIC GRAVITY, URINE: 1.025 (ref 1.005–1.030)
Urobilinogen, UA: 1 mg/dL (ref 0.0–1.0)

## 2013-12-10 LAB — LIPASE, BLOOD: Lipase: 51 U/L (ref 11–59)

## 2013-12-10 MED ORDER — ONDANSETRON HCL 4 MG/2ML IJ SOLN
4.0000 mg | Freq: Once | INTRAMUSCULAR | Status: AC
  Administered 2013-12-10: 4 mg via INTRAVENOUS

## 2013-12-10 MED ORDER — ONDANSETRON HCL 4 MG/2ML IJ SOLN
INTRAMUSCULAR | Status: AC
  Filled 2013-12-10: qty 2

## 2013-12-10 MED ORDER — SODIUM CHLORIDE 0.9 % IV BOLUS (SEPSIS)
1000.0000 mL | Freq: Once | INTRAVENOUS | Status: AC
  Administered 2013-12-10: 1000 mL via INTRAVENOUS

## 2013-12-10 MED ORDER — HYDROMORPHONE HCL 1 MG/ML IJ SOLN
INTRAMUSCULAR | Status: AC
  Filled 2013-12-10: qty 1

## 2013-12-10 MED ORDER — ONDANSETRON HCL 4 MG/2ML IJ SOLN
4.0000 mg | Freq: Once | INTRAMUSCULAR | Status: AC
  Administered 2013-12-10: 4 mg via INTRAVENOUS
  Filled 2013-12-10: qty 2

## 2013-12-10 MED ORDER — POTASSIUM CHLORIDE 10 MEQ/100ML IV SOLN
10.0000 meq | INTRAVENOUS | Status: AC
  Administered 2013-12-11: 10 meq via INTRAVENOUS
  Filled 2013-12-10: qty 100

## 2013-12-10 MED ORDER — POTASSIUM CHLORIDE CRYS ER 20 MEQ PO TBCR
40.0000 meq | EXTENDED_RELEASE_TABLET | Freq: Once | ORAL | Status: DC
  Filled 2013-12-10 (×2): qty 2

## 2013-12-10 MED ORDER — HYDROMORPHONE HCL 1 MG/ML IJ SOLN
0.5000 mg | Freq: Once | INTRAMUSCULAR | Status: AC
Start: 2013-12-10 — End: 2013-12-10
  Administered 2013-12-10: 0.5 mg via INTRAVENOUS

## 2013-12-10 MED ORDER — FENTANYL CITRATE 0.05 MG/ML IJ SOLN
50.0000 ug | Freq: Once | INTRAMUSCULAR | Status: AC
  Administered 2013-12-10: 50 ug via INTRAVENOUS
  Filled 2013-12-10: qty 2

## 2013-12-10 NOTE — H&P (Signed)
PCP:   Doran Heater, MD   Chief Complaint:  *Nausea and vomiting  HPI:  48 year old female who  has a past medical history of Lupus; Thyroid disease (2000); Hypertension; Abscess; Suicide attempt; Blood transfusion without reported diagnosis; Chronic abdominal pain; and Chronic wound infection of abdomen. Today presents to the ED with chief complaint of nausea vomiting and diarrhea him on for past to 5 days. Patient has chronic nonhealing wound in the lower anterior abdominal wall since November last year. Patient has had multiple abdominal surgeries. She was seen at [facility] him on Friday and has an appointment to see general surgeon as outpatient. Patient says that the wound started oozing pus this morning, and also complains of abdominal pain. CT scan of the abdomen and pelvis was done in the ED Which showed persistent abnormal thick rim enhancing fluid collections in the anterior abdominal wall extending to the skin likely chronic abscess no obvious communication with the intra-abdominal contents. Patient also admits to having chills, fever. Patient was seen in the ED on 23rd September at that time she was discharged on doxycycline. Patient says that she was unable to take the antibiotic because of nausea and vomiting. She also admits to having diarrhea 6-8 loose bowel movements everyday. She denies blood in the stool or vomitus.  Allergies:   Allergies  Allergen Reactions  . Codeine Hives  . Morphine And Related     gastritis      Past Medical History  Diagnosis Date  . Lupus   . 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 Hypertension   . Abscess     soft tissue  . Suicide attempt   . Blood transfusion without reported diagnosis   . Chronic abdominal pain   . Chronic wound infection of abdomen     Past Surgical History  Procedure Laterality Date  . Abdominal surgery    . Abdominal hysterectomy  2013    [place]  . Debridement of abdominal wall abscess N/A  02/08/2013    Procedure: DEBRIDEMENT OF ABDOMINAL WALL ABSCESS;  Surgeon: Jamesetta So, MD;  Location: [facility];  Service: General;  Laterality: N/A;  . Gastric bypass       [place], first 2000, 2005/2006.  . Colonoscopy      in [place]  . Cholecystectomy    . Colonoscopy with propofol N/A 05/20/2013    Dr.Rourk- inadequate prep, normal appearing rectum, grossly normal colon aside from pancolonic diverticula, normal terminal ileum bx= unremarkable colonic mucosa  . Esophagogastroduodenoscopy (egd) with propofol N/A 05/20/2013    Dr.Rourk- s/p prior gastric surgery with normal esophagus, residual gastric mucosa and patent efferent limb  . Esophageal biopsy  05/20/2013    Procedure: BIOPSIES OF ASCENDING AND SIGMOID COLON;  Surgeon: Daneil Dolin, MD;  Location: [facility];  Service: Endoscopy;;  . Wound exploration Right 06/24/2013    Procedure: exploration of traumatic wound right wrist;  Surgeon: Tennis Must, MD;  Location: [facility];  Service: Orthopedics;  Laterality: Right;  . Tendon repar    . Adrenal turmor removal      Prior to Admission medications   Medication Sig Start Date End Date Taking? Authorizing Provider  ARIPiprazole (ABILIFY) 5 MG tablet Take 1 tablet (5 mg total) by mouth 2 (two) times daily. 07/16/13  Yes Levonne Spiller, MD  doxycycline (VIBRAMYCIN) 100 MG capsule Take 1 capsule (100 mg total) by mouth 2 (two) times daily. 12/08/13  Yes Ezequiel Essex, MD  escitalopram Loma Sousa)  20 MG tablet Take 1 tablet (20 mg total) by mouth daily. 07/16/13 07/16/14 Yes Levonne Spiller, MD  ibuprofen (ADVIL,MOTRIN) 200 MG tablet Take 400 mg by mouth 2 (two) times daily as needed for moderate pain.    Yes Historical Provider, MD  lisinopril (PRINIVIL,ZESTRIL) 10 MG tablet Take 10 mg by mouth daily.   Yes Historical Provider, MD  ondansetron (ZOFRAN) 4 MG tablet Take 1 tablet (4 mg total) by mouth every 6 (six) hours. 12/08/13  Yes Ezequiel Essex, MD    Social History:  reports that she has been  smoking Cigarettes.  She has a 17.5 pack-year smoking history. She has never used smokeless tobacco. She reports that she drinks alcohol. She reports that she does not use illicit drugs.  Family History  Problem Relation Age of Onset  . Brain cancer Son   . Schizophrenia Son   . Cancer Son     brain  . Breast cancer Maternal Aunt   . Bipolar disorder Maternal Aunt   . Drug abuse Maternal Aunt   . Colon cancer Maternal Grandmother     late 45s, early 58s  . Lung cancer Father   . Cancer Father     mets  . Liver disease Neg Hx   . Drug abuse Mother   . Drug abuse Sister   . Drug abuse Brother   . Bipolar disorder Paternal Grandfather   . Bipolar disorder Cousin      All the positives are listed in BOLD  Review of Systems:  HEENT: Headache, blurred vision, runny nose, sore throat Neck: Hypothyroidism, hyperthyroidism,,lymphadenopathy Chest : Shortness of breath, history of COPD, Asthma Heart : Chest pain, history of coronary arterey disease GI:  Nausea, vomiting, diarrhea, constipation, GERD GU: Dysuria, urgency, frequency of urination, hematuria Neuro: Stroke, seizures, syncope Psych: Depression, anxiety, hallucinations   Physical Exam: Blood pressure 130/101, pulse 86, temperature 99.1 F (37.3 C), temperature source Oral, resp. rate 16, height 5\' 3"  (1.6 m), weight 55.792 kg (123 lb), SpO2 98.00%. Constitutional:   Patient is a well-developed and well-nourished *female in no acute distress and cooperative with exam. Head: Normocephalic and atraumatic Mouth: Mucus membranes moist Eyes: PERRL, EOMI, conjunctivae normal Neck: Supple, No Thyromegaly Cardiovascular: RRR, S1 normal, S2 normal Pulmonary/Chest: CTAB, no wheezes, rales, or rhonchi Abdominal: Soft. Non-tender, non-distended, bowel sounds are normal, no masses, organomegaly, or guarding present. Midline surgical incision, on the anterior abdominal wall below the bicuspid is chronic mood with pus draining from  open areas.  Neurological: A&O x3, Strenght is normal and symmetric bilaterally, cranial nerve II-XII are grossly intact, no focal motor deficit, sensory intact to light touch bilaterally.  Extremities : No Cyanosis, Clubbing or Edema  Labs on Admission:  Basic Metabolic Panel:  Recent Labs Lab 12/08/13 1933 12/08/13 2200 12/10/13 2018  NA 139 137 137  K 3.8 4.9 3.0*  CL 91* 94* 94*  CO2 21 24 29   GLUCOSE 98 83 85  BUN 5* 5* 6  CREATININE 0.54 0.57 0.54  CALCIUM 9.5 8.9 9.2   Liver Function Tests:  Recent Labs Lab 12/08/13 1933 12/10/13 2018  AST 66* 69*  ALT 41* 36*  ALKPHOS 96 88  BILITOT 0.9 1.1  PROT 8.4* 7.1  ALBUMIN 4.3 3.5    Recent Labs Lab 12/08/13 1933 12/10/13 2018  LIPASE 12 51   No results found for this basename: AMMONIA,  in the last 168 hours CBC:  Recent Labs Lab 12/08/13 1933 12/10/13 2018  WBC 6.5 5.7  NEUTROABS 5.1 3.0  HGB 13.7 12.0  HCT 38.1 34.2*  MCV 86.2 87.7  PLT 374 279   Cardiac Enzymes: No results found for this basename: CKTOTAL, CKMB, CKMBINDEX, TROPONINI,  in the last 168 hours  BNP (last 3 results) No results found for this basename: PROBNP,  in the last 8760 hours CBG: No results found for this basename: GLUCAP,  in the last 168 hours  Radiological Exams on Admission: Dg Abd Acute W/chest  12/10/2013   CLINICAL DATA:  Abdominal pain and distention for 5 days  EXAM: ACUTE ABDOMEN SERIES (ABDOMEN 2 VIEW & CHEST 1 VIEW)  COMPARISON:  CT abdomen/ pelvis 12/08/2013  FINDINGS: There is no evidence of dilated bowel loops or free intraperitoneal air. Postsurgical changes in the upper abdomen with multiple surgical clips in the right upper quadrant. Prior gastric bypass. No radiopaque calculi or other significant radiographic abnormality is seen. Heart size and mediastinal contours are within normal limits. Both lungs are clear.  IMPRESSION: Negative abdominal radiographs.  No acute cardiopulmonary disease.   Electronically  Signed   By: Kathreen Devoid   On: 12/10/2013 22:42       Assessment/Plan Principal Problem:   Gastroenteritis Active Problems:   History of schizoaffective disorder   History of gastric bypass   Essential hypertension, benign   Nausea with vomiting   Chronic abdominal wound infection  Gastroenteritis CT scan of the abdomen shows chronic inflammatory tinges in the colon, will admit the patient start IV fluids. Keep n.p.o. Zofran when necessary for nausea vomiting. If doesn't improve consider GI consultation.  Chronic abdominal wall abscess Patient has chronic abdominal wall wound since November of last year, will obtain wound culture. We'll start vancomycin per pharmacy consultation. Blood cultures have been obtained  Hypokalemia Replace potassium and check BMP in a.m.  Hypertension Continue lisinopril also start hydralazine 10 mg IV every 6 hours when necessary for BP greater than 160/100.  DVT prophylaxis Lovenox  Code status: Patient is full code  Family discussion: Admission, patients condition and plan of care including tests being ordered have been discussed with the patient and her son at bedside* who indicate understanding and agree with the plan and Code Status.   Time Spent on Admission: 60 minutes  [facility] Pager: [phone] 12/10/2013, 11:56 PM  If 7PM-7AM, please contact night-coverage  [email or web address]  Password [email or web address]

## 2013-12-10 NOTE — ED Provider Notes (Signed)
CSN: [id number]     Arrival date & time 12/10/13  1736 History   First MD Initiated Contact with Patient 12/10/13 1924   This chart was scribed for [name], MD by Rosary Lively, ED scribe. This patient was seen in room APA03/APA03 and the patient's care was started at 7:57 PM.    Chief Complaint  Patient presents with  . Emesis     The history is provided by the patient. No language interpreter was used.   HPI Comments:  Angel Price is a 48 y.o. female who presents to the Emergency Department complaining of nausea, vomiting, abdominal pain, and drainage from an abdominal wall abscess. Pt reports that she has been experiencing nausea and vomiting and inability to keep any fluids down for 5 days. Pt reports that she has experienced decreased bowel movements and urine.  Pt reports that she has had zero fluid or food in the last 5 days and her urine is orange.  Pt reports that she had surgery performed November 2015 for an incision and drainage of abdominal wall abcess. Pt states that she saw a physician at [facility] today.  A CAT scan A/P was performed at [facility] 4 days ago.  Physicians at [facility] stated she will need additional surgery to close the draining abdominal wall wound  Past Medical History  Diagnosis Date  . Lupus   . 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 Hypertension   . Abscess     soft tissue  . Suicide attempt   . Blood transfusion without reported diagnosis   . Chronic abdominal pain   . Chronic wound infection of abdomen    Past Surgical History  Procedure Laterality Date  . Abdominal surgery    . Abdominal hysterectomy  2013    [place]  . Debridement of abdominal wall abscess N/A 02/08/2013    Procedure: DEBRIDEMENT OF ABDOMINAL WALL ABSCESS;  Surgeon: Jamesetta So, MD;  Location: [facility];  Service: General;  Laterality: N/A;  . Gastric bypass       [place], first 2000, 2005/2006.  . Colonoscopy      in [place]  . Cholecystectomy    . Colonoscopy  with propofol N/A 05/20/2013    Dr.Rourk- inadequate prep, normal appearing rectum, grossly normal colon aside from pancolonic diverticula, normal terminal ileum bx= unremarkable colonic mucosa  . Esophagogastroduodenoscopy (egd) with propofol N/A 05/20/2013    Dr.Rourk- s/p prior gastric surgery with normal esophagus, residual gastric mucosa and patent efferent limb  . Esophageal biopsy  05/20/2013    Procedure: BIOPSIES OF ASCENDING AND SIGMOID COLON;  Surgeon: Daneil Dolin, MD;  Location: [facility];  Service: Endoscopy;;  . Wound exploration Right 06/24/2013    Procedure: exploration of traumatic wound right wrist;  Surgeon: Tennis Must, MD;  Location: [place];  Service: Orthopedics;  Laterality: Right;  . Tendon repar    . Adrenal turmor removal     Family History  Problem Relation Age of Onset  . Brain cancer Son   . Schizophrenia Son   . Cancer Son     brain  . Breast cancer Maternal Aunt   . Bipolar disorder Maternal Aunt   . Drug abuse Maternal Aunt   . Colon cancer Maternal Grandmother     late 63s, early 17s  . Lung cancer Father   . Cancer Father     mets  . Liver disease Neg Hx   . Drug abuse Mother   .  Drug abuse Sister   . Drug abuse Brother   . Bipolar disorder Paternal Grandfather   . Bipolar disorder Cousin    History  Substance Use Topics  . Smoking status: Current Every Day Smoker -- 0.50 packs/day for 35 years    Types: Cigarettes  . Smokeless tobacco: Never Used  . Alcohol Use: Yes     Comment: daily   OB History   Grav Para Term Preterm Abortions TAB SAB Ect Mult Living                 Review of Systems  Gastrointestinal: Positive for nausea, vomiting and abdominal pain. Negative for constipation.  Genitourinary: Negative for decreased urine volume.  Skin: Positive for wound.    10 Systems reviewed and are negative for acute change except as noted in the HPI.   Allergies  Codeine and Morphine and related  Home Medications   Prior to Admission  medications   Medication Sig Start Date End Date Taking? Authorizing Provider  ARIPiprazole (ABILIFY) 5 MG tablet Take 1 tablet (5 mg total) by mouth 2 (two) times daily. 07/16/13  Yes Levonne Spiller, MD  doxycycline (VIBRAMYCIN) 100 MG capsule Take 1 capsule (100 mg total) by mouth 2 (two) times daily. 12/08/13  Yes Ezequiel Essex, MD  escitalopram (LEXAPRO) 20 MG tablet Take 1 tablet (20 mg total) by mouth daily. 07/16/13 07/16/14 Yes Levonne Spiller, MD  ibuprofen (ADVIL,MOTRIN) 200 MG tablet Take 400 mg by mouth 2 (two) times daily as needed for moderate pain.    Yes Historical Provider, MD  lisinopril (PRINIVIL,ZESTRIL) 10 MG tablet Take 10 mg by mouth daily.   Yes Historical Provider, MD  ondansetron (ZOFRAN) 4 MG tablet Take 1 tablet (4 mg total) by mouth every 6 (six) hours. 12/08/13  Yes Ezequiel Essex, MD   BP 148/116  Pulse 108  Temp(Src) 99.1 F (37.3 C) (Oral)  Ht 5\' 3"  (1.6 m)  Wt 123 lb (55.792 kg)  BMI 21.79 kg/m2  SpO2 100% Physical Exam  Nursing note and vitals reviewed. Constitutional: She is oriented to person, place, and time. She appears well-developed and well-nourished.  HENT:  Head: Normocephalic and atraumatic.  Eyes: Conjunctivae and EOM are normal. Pupils are equal, round, and reactive to light.  Neck: Normal range of motion. Neck supple.  Cardiovascular: Normal rate, regular rhythm and normal heart sounds.   Pulmonary/Chest: Effort normal and breath sounds normal.  Abdominal: Soft. Bowel sounds are normal.  2 small sites draining purulent matter. Each of them approximately 3 mm in area. Indurated tissue and scar tissue surrounding the sites.   Musculoskeletal: Normal range of motion.  Neurological: She is alert and oriented to person, place, and time.  Skin: Skin is warm and dry.  Psychiatric: She has a normal mood and affect. Her behavior is normal.    ED Course  Procedures  DIAGNOSTIC STUDIES: Oxygen Saturation is 100% on RA, normal by my  interpretation.  COORDINATION OF CARE: 8:03 PM-Discussed treatment plan with pt and pt agreed to plan.  Labs Review Labs Reviewed  URINALYSIS, ROUTINE W REFLEX MICROSCOPIC - Abnormal; Notable for the following:    Color, Urine ORANGE (*)    Glucose, UA 100 (*)    Hgb urine dipstick SMALL (*)    Bilirubin Urine LARGE (*)    Ketones, ur 40 (*)    Protein, ur 30 (*)    Nitrite POSITIVE (*)    All other components within normal limits  URINE MICROSCOPIC-ADD ON -  Abnormal; Notable for the following:    Squamous Epithelial / LPF FEW (*)    All other components within normal limits  COMPREHENSIVE METABOLIC PANEL - Abnormal; Notable for the following:    Potassium 3.0 (*)    Chloride 94 (*)    AST 69 (*)    ALT 36 (*)    All other components within normal limits  CBC WITH DIFFERENTIAL - Abnormal; Notable for the following:    HCT 34.2 (*)    All other components within normal limits  LIPASE, BLOOD    Imaging Review Dg Abd Acute W/chest  12/10/2013   CLINICAL DATA:  Abdominal pain and distention for 5 days  EXAM: ACUTE ABDOMEN SERIES (ABDOMEN 2 VIEW & CHEST 1 VIEW)  COMPARISON:  CT abdomen/ pelvis 12/08/2013  FINDINGS: There is no evidence of dilated bowel loops or free intraperitoneal air. Postsurgical changes in the upper abdomen with multiple surgical clips in the right upper quadrant. Prior gastric bypass. No radiopaque calculi or other significant radiographic abnormality is seen. Heart size and mediastinal contours are within normal limits. Both lungs are clear.  IMPRESSION: Negative abdominal radiographs.  No acute cardiopulmonary disease.   Electronically Signed   By: Kathreen Devoid   On: 12/10/2013 22:42     EKG Interpretation None      MDM   Final diagnoses:  Gastroenteritis  Chronic abdominal wound infection, subsequent encounter    Second emergency department visit in 3 days.  Admit for IV hydration.  Discussed with Dr. Darrick Meigs  I personally performed the services  described in this documentation, which was scribed in my presence. The recorded information has been reviewed and is accurate.    [name], MD 12/10/13 [phone]

## 2013-12-10 NOTE — ED Notes (Signed)
Dr Llama at bedside.  

## 2013-12-10 NOTE — ED Notes (Signed)
Dr Cook at bedside

## 2013-12-10 NOTE — ED Notes (Signed)
Pt. Tolerating water. Pt. Given half of a 34mEq potassium chloride tablet and began dry heaving. Pt. Refusing to take remainder of medication at this time. Dr. Lacinda Axon notified.

## 2013-12-10 NOTE — ED Notes (Signed)
Pt with continued emesis since seen recently, unable to keep anything down as well, states urine orange in color

## 2013-12-11 ENCOUNTER — Encounter (HOSPITAL_COMMUNITY): Payer: Self-pay | Admitting: *Deleted

## 2013-12-11 DIAGNOSIS — F319 Bipolar disorder, unspecified: Secondary | ICD-10-CM

## 2013-12-11 DIAGNOSIS — I10 Essential (primary) hypertension: Secondary | ICD-10-CM

## 2013-12-11 LAB — COMPREHENSIVE METABOLIC PANEL
ALBUMIN: 2.9 g/dL — AB (ref 3.5–5.2)
ALK PHOS: 74 U/L (ref 39–117)
ALT: 30 U/L (ref 0–35)
ANION GAP: 13 (ref 5–15)
AST: 55 U/L — ABNORMAL HIGH (ref 0–37)
BUN: 6 mg/dL (ref 6–23)
CALCIUM: 8.4 mg/dL (ref 8.4–10.5)
CO2: 25 mEq/L (ref 19–32)
Chloride: 102 mEq/L (ref 96–112)
Creatinine, Ser: 0.54 mg/dL (ref 0.50–1.10)
GFR calc Af Amer: >90 mL/min (ref >90)
GFR calc non Af Amer: >90 mL/min (ref >90)
Glucose, Bld: 78 mg/dL (ref 70–99)
Potassium: 2.9 mEq/L — CL (ref 3.7–5.3)
SODIUM: 140 meq/L (ref 137–147)
TOTAL PROTEIN: 6 g/dL (ref 6.0–8.3)
Total Bilirubin: 1 mg/dL (ref 0.3–1.2)

## 2013-12-11 LAB — CBC
HCT: 29.6 % — ABNORMAL LOW (ref 36.0–46.0)
Hemoglobin: 10.4 g/dL — ABNORMAL LOW (ref 12.0–15.0)
MCH: 30.9 pg (ref 26.0–34.0)
MCHC: 35.1 g/dL (ref 30.0–36.0)
MCV: 87.8 fL (ref 78.0–100.0)
Platelets: 240 10*3/uL (ref 150–400)
RBC: 3.37 MIL/uL — ABNORMAL LOW (ref 3.87–5.11)
RDW: 14.9 % (ref 11.5–15.5)
WBC: 4.8 10*3/uL (ref 4.0–10.5)

## 2013-12-11 MED ORDER — PROMETHAZINE HCL 12.5 MG PO TABS
12.5000 mg | ORAL_TABLET | Freq: Four times a day (QID) | ORAL | Status: DC | PRN
  Administered 2013-12-13: 12.5 mg via ORAL
  Filled 2013-12-11: qty 1

## 2013-12-11 MED ORDER — POTASSIUM CHLORIDE CRYS ER 20 MEQ PO TBCR
40.0000 meq | EXTENDED_RELEASE_TABLET | Freq: Two times a day (BID) | ORAL | Status: AC
  Administered 2013-12-11 (×2): 40 meq via ORAL
  Filled 2013-12-11: qty 2

## 2013-12-11 MED ORDER — DIPHENHYDRAMINE HCL 50 MG/ML IJ SOLN
25.0000 mg | Freq: Four times a day (QID) | INTRAMUSCULAR | Status: DC | PRN
  Administered 2013-12-11 – 2013-12-13 (×10): 25 mg via INTRAVENOUS
  Filled 2013-12-11 (×10): qty 1

## 2013-12-11 MED ORDER — SODIUM CHLORIDE 0.9 % IV SOLN
INTRAVENOUS | Status: DC
  Administered 2013-12-11 – 2013-12-12 (×3): via INTRAVENOUS

## 2013-12-11 MED ORDER — PROMETHAZINE HCL 25 MG/ML IJ SOLN
12.5000 mg | Freq: Four times a day (QID) | INTRAMUSCULAR | Status: DC | PRN
  Administered 2013-12-11 – 2013-12-13 (×9): 12.5 mg via INTRAVENOUS
  Filled 2013-12-11 (×9): qty 1

## 2013-12-11 MED ORDER — HYDRALAZINE HCL 20 MG/ML IJ SOLN
10.0000 mg | Freq: Four times a day (QID) | INTRAMUSCULAR | Status: DC | PRN
  Administered 2013-12-12: 10 mg via INTRAVENOUS
  Filled 2013-12-11: qty 1

## 2013-12-11 MED ORDER — VANCOMYCIN HCL IN DEXTROSE 750-5 MG/150ML-% IV SOLN
750.0000 mg | Freq: Two times a day (BID) | INTRAVENOUS | Status: DC
  Administered 2013-12-11 – 2013-12-13 (×4): 750 mg via INTRAVENOUS
  Filled 2013-12-11 (×6): qty 150

## 2013-12-11 MED ORDER — ARIPIPRAZOLE 10 MG PO TABS
5.0000 mg | ORAL_TABLET | Freq: Two times a day (BID) | ORAL | Status: DC
  Administered 2013-12-11 – 2013-12-13 (×5): 5 mg via ORAL
  Filled 2013-12-11 (×5): qty 1

## 2013-12-11 MED ORDER — VANCOMYCIN HCL 10 G IV SOLR
1500.0000 mg | Freq: Once | INTRAVENOUS | Status: AC
  Administered 2013-12-11: 1500 mg via INTRAVENOUS
  Filled 2013-12-11: qty 1500

## 2013-12-11 MED ORDER — ONDANSETRON HCL 4 MG/2ML IJ SOLN
4.0000 mg | Freq: Four times a day (QID) | INTRAMUSCULAR | Status: DC | PRN
  Administered 2013-12-11: 4 mg via INTRAVENOUS
  Filled 2013-12-11: qty 2

## 2013-12-11 MED ORDER — MUPIROCIN 2 % EX OINT
TOPICAL_OINTMENT | Freq: Two times a day (BID) | CUTANEOUS | Status: DC
  Administered 2013-12-11 – 2013-12-13 (×5): via NASAL
  Filled 2013-12-11 (×2): qty 22

## 2013-12-11 MED ORDER — HYDROMORPHONE HCL 1 MG/ML IJ SOLN
1.0000 mg | INTRAMUSCULAR | Status: DC | PRN
  Administered 2013-12-11 – 2013-12-12 (×8): 1 mg via INTRAVENOUS
  Filled 2013-12-11 (×8): qty 1

## 2013-12-11 MED ORDER — ENOXAPARIN SODIUM 40 MG/0.4ML ~~LOC~~ SOLN
40.0000 mg | SUBCUTANEOUS | Status: DC
  Administered 2013-12-11 – 2013-12-13 (×3): 40 mg via SUBCUTANEOUS
  Filled 2013-12-11 (×3): qty 0.4

## 2013-12-11 MED ORDER — LISINOPRIL 10 MG PO TABS
10.0000 mg | ORAL_TABLET | Freq: Every day | ORAL | Status: DC
  Administered 2013-12-11 – 2013-12-13 (×3): 10 mg via ORAL
  Filled 2013-12-11 (×3): qty 1

## 2013-12-11 MED ORDER — ESCITALOPRAM OXALATE 10 MG PO TABS
20.0000 mg | ORAL_TABLET | Freq: Every day | ORAL | Status: DC
  Administered 2013-12-11 – 2013-12-13 (×3): 20 mg via ORAL
  Filled 2013-12-11 (×3): qty 2

## 2013-12-11 MED ORDER — ONDANSETRON HCL 4 MG PO TABS: 4.0000 mg | ORAL_TABLET | Freq: Four times a day (QID) | ORAL | Status: DC | PRN

## 2013-12-11 NOTE — Progress Notes (Signed)
ANTIBIOTIC CONSULT NOTE  Pharmacy Consult for Vancomycin Indication: Cellulitis  Allergies  Allergen Reactions  . Codeine Hives  . Morphine And Related     gastritis    Patient Measurements: Height: 5\' 3"  (160 cm) Weight: 124 lb 1.6 oz (56.291 kg) IBW/kg (Calculated) : 52.4  Vital Signs: Temp: 98.7 F (37.1 C) (09/26 0044) Temp src: Oral (09/26 0044) BP: 146/102 mmHg (09/26 0044) Pulse Rate: 91 (09/26 0044)  Labs:  Recent Labs  12/08/13 1933 12/08/13 2200 12/10/13 2018 12/11/13 0559  WBC 6.5  --  5.7 4.8  HGB 13.7  --  12.0 10.4*  PLT 374  --  279 240  CREATININE 0.54 0.57 0.54 0.54    Estimated Creatinine Clearance: 72.7 ml/min (by C-G formula based on Cr of 0.54).  No results found for this basename: VANCOTROUGH, Corlis Leak, VANCORANDOM, [place], GENTPEAK, GENTRANDOM, TOBRATROUGH, TOBRAPEAK, TOBRARND, AMIKACINPEAK, AMIKACINTROU, AMIKACIN,  in the last 72 hours   Microbiology: Recent Results (from the past 720 hour(s))  WOUND CULTURE     Status: None   Collection Time    12/08/13  7:40 PM      Result Value Ref Range Status   Specimen Description WOUND ABDOMEN   Final   Special Requests IMMUNE:COMPROMISED   Final   Gram Stain     Final   Value: RARE WBC PRESENT, PREDOMINANTLY PMN     NO SQUAMOUS EPITHELIAL CELLS SEEN     NO ORGANISMS SEEN     Performed at [facility]   Culture     Final   Value: MODERATE DIPHTHEROIDS(CORYNEBACTERIUM SPECIES)     Note: Standardized susceptibility testing for this organism is not available.     Performed at [facility]   Report Status 12/10/2013 FINAL   Final    Medical History: Past Medical History  Diagnosis Date  . Lupus   . 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 Hypertension   . Abscess     soft tissue  . Suicide attempt   . Blood transfusion without reported diagnosis   . Chronic abdominal pain   . Chronic wound infection of abdomen     Medications:   Assessment: 48 yo  female admitted with N/V and diarrhea. Pt was seen at [facility] on 9/25 for chronic abdominal wall wound infection with recommendation for surgical closure; presents now with oozing from wound and complaints of chills and fever. Pt will be started on Vancomycin for cellulitis with blood cultures pending. Vancomycin 1500 mg IV dose given 12/11/2013 at 3 AM  Goal of Therapy: Vancomycin troughs 10-15 mcg/ml   Plan:  Vancomycin 750 mg IV every 12 hours Vancomycin trough at steady state Monitor renal function Labs per protocol  Chriss Czar, [place] 12/11/2013,8:54 AM

## 2013-12-11 NOTE — Progress Notes (Signed)
[facility]                                                                              Patient Demographics  Angel Price, is a 48 y.o. female, DOB - 17-Mar-1966, VHQ:[id number]  Admit date - 12/10/2013   Admitting Physician Oswald Hillock, MD  Outpatient Primary MD for the patient is Doran Heater, MD  LOS - 1   Chief Complaint  Patient presents with  . Emesis      HPI on 12/10/2013 48 year old female history of lupus, thyroid disease, hypertension, suicide attempt, chronic abdominal pain and chronic wound infection of the abdomen presented to the emergency department with complaints of nausea, vomiting, diarrhea for approximately 5 days. Patient has a chronic nonhealing wound in the lower anterior abdominal wall since November of 2014. She has had multiple abdominal surgeries.  She was recently seen at [facility] this past Friday and has an appointment with the general surgeon as an outpatient. Patient stated her wound started oozing pus on the morning of admission and she also complained of abdominal pain. CT of the abdomen and pelvis was done in the emergency department showing persistent abnormal thick rim enhancing fluid collections in the anterior abdominal wall extending to the skin likely chronic abscess. Patient admitted to having chills and fever. She was also seen in the emergency department on September 23 and at that time was discharged with doxycycline. She was unable to take her antibiotics due to nausea and vomiting. She admit to having 6-8 loose bowel movements every day. Patient denies any blood in her stool or vomitus.  Assessment & Plan  Patient admitted early this morning by Dr. Eleonore Chiquito.  Agree with assessment and plan.  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 ARIPiprazole  5 mg Oral BID  . enoxaparin (LOVENOX) injection  40 mg Subcutaneous Q24H  . escitalopram  20 mg Oral Daily  . lisinopril  10 mg Oral Daily  . potassium chloride SA  40 mEq Oral Once  . potassium chloride  40 mEq Oral BID AC  . vancomycin  750 mg Intravenous Q12H   Continuous Infusions: . sodium chloride     PRN Meds:.diphenhydrAMINE, hydrALAZINE, HYDROmorphone (DILAUDID) injection, promethazine, promethazine  Antibiotics   Anti-infectives   Start     Dose/Rate Route Frequency Ordered Stop   12/11/13 1500  vancomycin (VANCOCIN) IVPB 750 mg/150 ml premix     750 mg 150 mL/hr over 60 Minutes Intravenous Every 12  hours 12/11/13 0749     12/11/13 0100  vancomycin (VANCOCIN) 1,500 mg in sodium chloride 0.9 % 500 mL IVPB     1,500 mg 250 mL/hr over 120 Minutes Intravenous  Once 12/11/13 0059 12/11/13 0456      Subjective:   Angel Price seen and examined today.  Patient continues to complain of nausea and states that zofran does not help her.  She also complains of itching.  Patient does complain of some abdominal pain, but states that the pain medications help.  She denies any chest pain, shortness of breath, headache, dizziness.  Patient states she's not had a bowel movement or any diarrhea this morning.   Objective:   Filed Vitals:   12/10/13 1750 12/10/13 2015 12/11/13 0017 12/11/13 0044  BP: 148/116 130/101 134/99  146/102  Pulse: 108 86 84 91  Temp: 99.1 F (37.3 C)   98.7 F (37.1 C)  TempSrc: Oral   Oral  Resp:  16 16 18   Height: 5\' 3"  (1.6 m)   5\' 3"  (1.6 m)  Weight: 55.792 kg (123 lb)   56.291 kg (124 lb 1.6 oz)  SpO2: 100% 98% 96% 99%    Wt Readings from Last 3 Encounters:  12/11/13 56.291 kg (124 lb 1.6 oz)  12/08/13 59.875 kg (132 lb)  09/29/13 59.421 kg (131 lb)     Intake/Output Summary (Last 24 hours) at 12/11/13 1031 Last data filed at 12/11/13 0951  Gross per 24 hour  Intake      0 ml  Output      0 ml  Net      0 ml    Exam  General: Well developed, well nourished, NAD, appears stated age  HEENT: NCAT, PERRLA, EOMI, Anicteic Sclera, mucous membranes moist.   Cardiovascular: S1 S2 auscultated, no rubs, murmurs or gallops. Regular rate and rhythm.  Respiratory: Clear to auscultation bilaterally with equal chest rise  Abdomen: Soft, nontender, nondistended, + bowel sounds.  Midline incision.  Small open wound, minimal drainage.  Extremities: warm dry without cyanosis clubbing or edema  Neuro: AAOx3, cranial nerves grossly intact. Strength 5/5 in patient's upper and lower extremities bilaterally  Psych: Normal affect and demeanor   Data Review   Micro Results Recent Results (from the past 240 hour(s))  WOUND CULTURE     Status: None   Collection Time    12/08/13  7:40 PM      Result Value Ref Range Status   Specimen Description WOUND ABDOMEN   Final   Special Requests IMMUNE:COMPROMISED   Final   Gram Stain     Final   Value: RARE WBC PRESENT, PREDOMINANTLY PMN     NO SQUAMOUS EPITHELIAL CELLS SEEN     NO ORGANISMS SEEN     Performed at [facility]   Culture     Final   Value: MODERATE DIPHTHEROIDS(CORYNEBACTERIUM SPECIES)     Note: Standardized susceptibility testing for this organism is not available.     Performed at [facility]   Report Status 12/10/2013 FINAL   Final    Radiology Reports Ct Abdomen Pelvis W  Contrast  12/08/2013   CLINICAL DATA:  Draining abdominal wound.  EXAM: CT ABDOMEN AND PELVIS WITH CONTRAST  TECHNIQUE: Multidetector CT imaging of the abdomen and pelvis was performed using the standard protocol following bolus administration of intravenous contrast.  CONTRAST:  132mL OMNIPAQUE IOHEXOL 300 MG/ML  SOLN  COMPARISON:  06/06/2013.  FINDINGS: Lower chest: The lung bases are  clear. No pleural effusion. The heart is normal in size. No pericardial effusion. The distal esophagus is grossly normal. Surgical changes are noted near the GE junction.  Hepatobiliary: Diffuse fatty infiltration of the liver but no focal hepatic lesions or intrahepatic biliary dilatation. The gallbladder is surgically absent. There is stable moderate common bile duct dilatation.  Spleen: Normal size.  No focal lesions.  Pancreas: Normal and stable.  Stomach/Bowel: Surgical changes from gastric bypass surgery. No complicating features are demonstrated. These small bowel and colon appear stable. Fibrofatty infiltrative changes involving the entire colon consistent with remote inflammatory bowel disease. No active inflammation is demonstrated.  Adrenals/urinary tract: The left adrenal gland adenoma is stable. The right adrenal gland is surgically absent. Both kidneys are unremarkable.  Vascular/Lymphatic: The aorta and branch vessels are patent. The major venous structures are patent. No mesenteric or retroperitoneal mass or adenopathy.  Reproductive: The uterus is surgically absent. No pelvic mass or adenopathy. The bladder appears normal. No inguinal mass or adenopathy.  Musculoskeletal: No significant bony findings.  Other: There is persistent abnormal soft tissue thickening involving the anterior abdominal wall this appears to be the AA thick rim enhancing fluid collection which is likely an abscess draining to the region of the emboli kiss. I do not C8 direct communication with the abdomen or bowel. There is no leaking oral  contrast to suggest a fistula but a subtle fistula is possible as several the small bowel loops in the abdomen the contrast in none.  IMPRESSION: Persistent abnormal thick rim enhancing fluid collections in the anterior abdominal wall extending to the skin, likely chronic abscess. No obvious communication with the intra-abdominal contents.  Surgical changes from gastric bypass surgery. Right adrenalectomy is also noted.  No acute or significant intra abdominal/pelvic findings.  Changes of chronic inflammatory bowel disease involving the colon.   Electronically Signed   By: Kalman Jewels M.D.   On: 12/08/2013 21:17   Dg Abd Acute W/chest  12/10/2013   CLINICAL DATA:  Abdominal pain and distention for 5 days  EXAM: ACUTE ABDOMEN SERIES (ABDOMEN 2 VIEW & CHEST 1 VIEW)  COMPARISON:  CT abdomen/ pelvis 12/08/2013  FINDINGS: There is no evidence of dilated bowel loops or free intraperitoneal air. Postsurgical changes in the upper abdomen with multiple surgical clips in the right upper quadrant. Prior gastric bypass. No radiopaque calculi or other significant radiographic abnormality is seen. Heart size and mediastinal contours are within normal limits. Both lungs are clear.  IMPRESSION: Negative abdominal radiographs.  No acute cardiopulmonary disease.   Electronically Signed   By: Kathreen Devoid   On: 12/10/2013 22:42    CBC  Recent Labs Lab 12/08/13 1933 12/10/13 2018 12/11/13 0559  WBC 6.5 5.7 4.8  HGB 13.7 12.0 10.4*  HCT 38.1 34.2* 29.6*  PLT 374 279 240  MCV 86.2 87.7 87.8  MCH 31.0 30.8 30.9  MCHC 36.0 35.1 35.1  RDW 15.2 14.9 14.9  LYMPHSABS 0.6* 2.0  --   MONOABS 0.8 1.0  --   EOSABS 0.0 0.0  --   BASOSABS 0.0 0.0  --     Chemistries   Recent Labs Lab 12/08/13 1933 12/08/13 2200 12/10/13 2018 12/11/13 0559  NA 139 137 137 140  K 3.8 4.9 3.0* 2.9*  CL 91* 94* 94* 102  CO2 21 24 29 25   GLUCOSE 98 83 85 78  BUN 5* 5* 6 6  CREATININE 0.54 0.57 0.54 0.54  CALCIUM 9.5 8.9 9.2  8.4  AST  66*  --  69* 55*  ALT 41*  --  36* 30  ALKPHOS 96  --  88 74  BILITOT 0.9  --  1.1 1.0   ------------------------------------------------------------------------------------------------------------------ estimated creatinine clearance is 72.7 ml/min (by C-G formula based on Cr of 0.54). ------------------------------------------------------------------------------------------------------------------ No results found for this basename: HGBA1C,  in the last 72 hours ------------------------------------------------------------------------------------------------------------------ No results found for this basename: CHOL, HDL, LDLCALC, TRIG, CHOLHDL, LDLDIRECT,  in the last 72 hours ------------------------------------------------------------------------------------------------------------------ No results found for this basename: TSH, T4TOTAL, FREET3, T3FREE, THYROIDAB,  in the last 72 hours ------------------------------------------------------------------------------------------------------------------ No results found for this basename: VITAMINB12, FOLATE, FERRITIN, TIBC, IRON, RETICCTPCT,  in the last 72 hours  Coagulation profile No results found for this basename: INR, PROTIME,  in the last 168 hours  No results found for this basename: DDIMER,  in the last 72 hours  Cardiac Enzymes No results found for this basename: CK, CKMB, TROPONINI, MYOGLOBIN,  in the last 168 hours ------------------------------------------------------------------------------------------------------------------ No components found with this basename: POCBNP,     Tahjae Durr D.O. on 12/11/2013 at 10:31 AM  Between 7am to 7pm - Pager - [phone]  After 7pm go to [email or web address] - password [email or web address]  And look for the night coverage person covering for me after hours  [facility] Office  [phone]

## 2013-12-11 NOTE — Progress Notes (Signed)
ANTIBIOTIC CONSULT NOTE-Preliminary  Pharmacy Consult for Vancomycin Indication: Cellulitis  Allergies  Allergen Reactions  . Codeine Hives  . Morphine And Related     gastritis    Patient Measurements: Height: 5\' 3"  (160 cm) Weight: 124 lb 1.6 oz (56.291 kg) IBW/kg (Calculated) : 52.4  Vital Signs: Temp: 98.7 F (37.1 C) (09/26 0044) Temp src: Oral (09/26 0044) BP: 146/102 mmHg (09/26 0044) Pulse Rate: 91 (09/26 0044)  Labs:  Recent Labs  12/08/13 1933 12/08/13 2200 12/10/13 2018  WBC 6.5  --  5.7  HGB 13.7  --  12.0  PLT 374  --  279  CREATININE 0.54 0.57 0.54    Estimated Creatinine Clearance: 72.7 ml/min (by C-G formula based on Cr of 0.54).  No results found for this basename: VANCOTROUGH, Corlis Leak, VANCORANDOM, [place], GENTPEAK, GENTRANDOM, TOBRATROUGH, TOBRAPEAK, TOBRARND, AMIKACINPEAK, AMIKACINTROU, AMIKACIN,  in the last 72 hours   Microbiology: Recent Results (from the past 720 hour(s))  WOUND CULTURE     Status: None   Collection Time    12/08/13  7:40 PM      Result Value Ref Range Status   Specimen Description WOUND ABDOMEN   Final   Special Requests IMMUNE:COMPROMISED   Final   Gram Stain     Final   Value: RARE WBC PRESENT, PREDOMINANTLY PMN     NO SQUAMOUS EPITHELIAL CELLS SEEN     NO ORGANISMS SEEN     Performed at [facility]   Culture     Final   Value: MODERATE DIPHTHEROIDS(CORYNEBACTERIUM SPECIES)     Note: Standardized susceptibility testing for this organism is not available.     Performed at [facility]   Report Status 12/10/2013 FINAL   Final    Medical History: Past Medical History  Diagnosis Date  . Lupus   . 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 Hypertension   . Abscess     soft tissue  . Suicide attempt   . Blood transfusion without reported diagnosis   . Chronic abdominal pain   . Chronic wound infection of abdomen     Medications:   Assessment: 48 yo female admitted with N/V  and diarrhea. Pt was seen at [place] [facility] on 9/25 for chronic abdominal wall wound infection with recommendation for surgical closure; presents now with oozing from wound and complaints of chills and fever. Pt will be started on Vancomycin for cellulitis with blood cultures pending.  Goal of Therapy: Vancomycin troughs 10-15 mcg/ml   Plan:  Preliminary review of pertinent patient information completed.  Protocol will be initiated with a one-time dose of Vancomycin 1500 mg IV.  Forestine Na clinical pharmacist will complete review during morning rounds to assess patient and finalize treatment regimen.  Norberto Sorenson, [facility] 12/11/2013,1:08 AM

## 2013-12-11 NOTE — Consult Note (Signed)
Patient well known to me. She has had this chronic draining wound for some time now. She has been referred to both plastics and other surgeons for evaluation. It is not felt that she has an enterocutaneous fistula. This has been a poorly healing wound since her multiple abdominal surgeries in the past. I have not much more to offer. Wound care orders have been ordered. We'll cancel wound nurse consultation.

## 2013-12-11 NOTE — Progress Notes (Signed)
Utilization review Completed Keegan Bensch RN BSN   

## 2013-12-12 DIAGNOSIS — R197 Diarrhea, unspecified: Secondary | ICD-10-CM

## 2013-12-12 DIAGNOSIS — Z5189 Encounter for other specified aftercare: Secondary | ICD-10-CM

## 2013-12-12 LAB — CBC
HCT: 29.2 % — ABNORMAL LOW (ref 36.0–46.0)
HEMOGLOBIN: 9.9 g/dL — AB (ref 12.0–15.0)
MCH: 30.3 pg (ref 26.0–34.0)
MCHC: 33.9 g/dL (ref 30.0–36.0)
MCV: 89.3 fL (ref 78.0–100.0)
PLATELETS: 216 10*3/uL (ref 150–400)
RBC: 3.27 MIL/uL — AB (ref 3.87–5.11)
RDW: 15.2 % (ref 11.5–15.5)
WBC: 4.8 10*3/uL (ref 4.0–10.5)

## 2013-12-12 LAB — BASIC METABOLIC PANEL
ANION GAP: 10 (ref 5–15)
BUN: 5 mg/dL — ABNORMAL LOW (ref 6–23)
CALCIUM: 8.4 mg/dL (ref 8.4–10.5)
CHLORIDE: 106 meq/L (ref 96–112)
CO2: 23 mEq/L (ref 19–32)
Creatinine, Ser: 0.5 mg/dL (ref 0.50–1.10)
GFR calc Af Amer: >90 mL/min (ref >90)
GFR calc non Af Amer: >90 mL/min (ref >90)
GLUCOSE: 81 mg/dL (ref 70–99)
POTASSIUM: 3.5 meq/L — AB (ref 3.7–5.3)
SODIUM: 139 meq/L (ref 137–147)

## 2013-12-12 MED ORDER — ALUM & MAG HYDROXIDE-SIMETH 200-200-20 MG/5ML PO SUSP
15.0000 mL | ORAL | Status: DC | PRN
  Administered 2013-12-12: 15 mL via ORAL
  Filled 2013-12-12: qty 30

## 2013-12-12 MED ORDER — OXYCODONE HCL 5 MG PO TABS
5.0000 mg | ORAL_TABLET | ORAL | Status: DC | PRN
  Administered 2013-12-12 – 2013-12-13 (×6): 5 mg via ORAL
  Filled 2013-12-12 (×6): qty 1

## 2013-12-12 MED ORDER — OXYCODONE HCL 5 MG PO TABS
5.0000 mg | ORAL_TABLET | Freq: Once | ORAL | Status: AC
  Administered 2013-12-12: 5 mg via ORAL
  Filled 2013-12-12: qty 1

## 2013-12-12 MED ORDER — FAMOTIDINE 20 MG PO TABS
20.0000 mg | ORAL_TABLET | Freq: Every day | ORAL | Status: DC
  Administered 2013-12-12 – 2013-12-13 (×2): 20 mg via ORAL
  Filled 2013-12-12 (×2): qty 1

## 2013-12-12 NOTE — Progress Notes (Signed)
MD paged and notified of patients elevated BP this afternoon

## 2013-12-12 NOTE — Progress Notes (Addendum)
[facility]                                                                              Patient Demographics  Angel Price, is a 48 y.o. female, DOB - 03-26-1965, HWE:[id number]  Admit date - 12/10/2013   Admitting Physician Oswald Hillock, MD  Outpatient Primary MD for the patient is Doran Heater, MD  LOS - 2   Chief Complaint  Patient presents with  . Emesis      HPI on 12/10/2013 48 year old female history of lupus, thyroid disease, hypertension, suicide attempt, chronic abdominal pain and chronic wound infection of the abdomen presented to the emergency department with complaints of nausea, vomiting, diarrhea for approximately 5 days. Patient has a chronic nonhealing wound in the lower anterior abdominal wall since November of 2014. She has had multiple abdominal surgeries.  She was recently seen at [place] this past Friday and has an appointment with the general surgeon as an outpatient. Patient stated her wound started oozing pus on the morning of admission and she also complained of abdominal pain. CT of the abdomen and pelvis was done in the emergency department showing persistent abnormal thick rim enhancing fluid collections in the anterior abdominal wall extending to the skin likely chronic abscess. Patient admitted to having chills and fever. She was also seen in the emergency department on September 23 and at that time was discharged with doxycycline. She was unable to take her antibiotics due to nausea and vomiting. She admit to having 6-8 loose bowel movements every day. Patient denies any blood in her stool or vomitus.  Assessment & Plan  Patient admitted early this morning by Dr. Eleonore Chiquito.  Agree with assessment and plan.  Abdominal pain with nausea, vomiting and diarrhea secondary to gastroenteritis -Will advance diet to clear liquids.   -Will d/c dilaudid and start PO oxycodone PRN -Continue antiemetics as needed -Pending GI pathogen panel *of note: When  patient was told that she would have her pain medications decreased, she asked for one last dose of dialudid.  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 It is not felt that patient has an enterocutaneous fistula. -Patient has had several surgeries in the past, and has poor wound healing.   Hypokalemia -Improving. Possibly secondary to GI losses -Will continue to replace and monitor BMP  Hypertension -Continue lisinopril and hydralazine PRN  Normocytic anemia -Likely dilutional as patient has been receiving IVF.   -Will continue to monitor.  Currently patient is hemodynamically stable.  Code Status: Full  Family Communication: None at bedside  Disposition Plan: Admitted  Time Spent in minutes   25 minutes  Procedures  None  Consults   General surgery  DVT Prophylaxis  Lovenox  Lab Results  Component Value Date   PLT 216 12/12/2013    Medications  Scheduled Meds: . ARIPiprazole  5 mg Oral BID  . enoxaparin (LOVENOX) injection  40 mg Subcutaneous Q24H  . escitalopram  20 mg Oral Daily  . lisinopril  10 mg Oral Daily  . mupirocin ointment   Nasal BID  . potassium chloride SA  40 mEq Oral Once  . vancomycin  750 mg Intravenous Q12H   Continuous Infusions: . sodium chloride 100 mL/hr at 12/12/13 1030   PRN Meds:.diphenhydrAMINE, hydrALAZINE, oxyCODONE, promethazine, promethazine  Antibiotics   Anti-infectives   Start     Dose/Rate Route Frequency Ordered Stop   12/11/13 1500  vancomycin (VANCOCIN) IVPB 750 mg/150 ml premix     750 mg 150 mL/hr over 60 Minutes Intravenous Every 12 hours 12/11/13 0749     12/11/13 0100  vancomycin (VANCOCIN) 1,500 mg in sodium chloride 0.9 % 500 mL IVPB     1,500 mg 250 mL/hr over 120 Minutes Intravenous  Once 12/11/13 0059 12/11/13 0456      Subjective:   Loreley Schwall seen and examined today.   Patient states she feels somewhat better. She states she is unable to drink and tolerate anything by mouth. Patient also states she's had one episode of a loose bowel movement. She denies any chest pain, shortness of breath, headache, dizziness.    Objective:   Filed Vitals:   12/11/13 0044 12/11/13 1447 12/11/13 2246 12/12/13 0705  BP: 146/102 109/78 115/82 112/80  Pulse: 91 67 71 67  Temp: 98.7 F (37.1 C) 98 F (36.7 C) 98 F (36.7 C) 98 F (36.7 C)  TempSrc: Oral  Oral Oral  Resp: 18 18 20 20   Height: 5\' 3"  (1.6 m)     Weight: 56.291 kg (124 lb 1.6 oz)     SpO2: 99% 97% 94% 96%    Wt Readings from Last 3 Encounters:  12/11/13 56.291 kg (124 lb 1.6 oz)  12/08/13 59.875 kg (132 lb)  09/29/13 59.421 kg (131 lb)     Intake/Output Summary (Last 24 hours) at 12/12/13 1106 Last data filed at 12/12/13 1046  Gross per 24 hour  Intake 2828.33 ml  Output   1000 ml  Net 1828.33 ml    Exam  General: Well developed, well nourished, NAD, appears stated age  HEENT: NCAT, mucous membranes moist.   Cardiovascular: S1 S2 auscultated, no rubs, murmurs or gallops. Regular rate and rhythm.  Respiratory: Clear to auscultation bilaterally with equal chest rise  Abdomen: Soft, nontender, nondistended, + bowel sounds.  Midline incision.  Small open wound, minimal drainage.  Extremities: warm dry without cyanosis clubbing or edema  Neuro: AAOx3, cranial nerves grossly intact. Strength 5/5 in patient's upper and lower extremities bilaterally  Psych: Normal affect and demeanor    Data Review   Micro Results Recent Results (from the past 240 hour(s))  WOUND CULTURE     Status: None   Collection Time    12/08/13  7:40 PM      Result Value Ref Range Status   Specimen Description WOUND ABDOMEN   Final   Special Requests IMMUNE:COMPROMISED   Final   Gram Stain     Final   Value: RARE WBC PRESENT, PREDOMINANTLY PMN     NO SQUAMOUS EPITHELIAL CELLS SEEN     NO ORGANISMS SEEN      Performed at [facility]   Culture     Final   Value: MODERATE DIPHTHEROIDS(CORYNEBACTERIUM SPECIES)     Note: Standardized susceptibility testing for this organism is not available.     Performed at [facility]   Report Status 12/10/2013 FINAL   Final  WOUND CULTURE     Status: None   Collection Time    12/11/13  3:10 AM      Result Value Ref Range Status  Specimen Description WOUND   Final   Special Requests NONE   Final   Gram Stain PENDING   Incomplete   Culture     Final   Value: NO GROWTH 1 DAY     Performed at [facility]   Report Status PENDING   Incomplete    Radiology Reports Ct Abdomen Pelvis W Contrast  12/08/2013   CLINICAL DATA:  Draining abdominal wound.  EXAM: CT ABDOMEN AND PELVIS WITH CONTRAST  TECHNIQUE: Multidetector CT imaging of the abdomen and pelvis was performed using the standard protocol following bolus administration of intravenous contrast.  CONTRAST:  178mL OMNIPAQUE IOHEXOL 300 MG/ML  SOLN  COMPARISON:  06/06/2013.  FINDINGS: Lower chest: The lung bases are clear. No pleural effusion. The heart is normal in size. No pericardial effusion. The distal esophagus is grossly normal. Surgical changes are noted near the GE junction.  Hepatobiliary: Diffuse fatty infiltration of the liver but no focal hepatic lesions or intrahepatic biliary dilatation. The gallbladder is surgically absent. There is stable moderate common bile duct dilatation.  Spleen: Normal size.  No focal lesions.  Pancreas: Normal and stable.  Stomach/Bowel: Surgical changes from gastric bypass surgery. No complicating features are demonstrated. These small bowel and colon appear stable. Fibrofatty infiltrative changes involving the entire colon consistent with remote inflammatory bowel disease. No active inflammation is demonstrated.  Adrenals/urinary tract: The left adrenal gland adenoma is stable. The right adrenal gland is surgically absent. Both kidneys are unremarkable.   Vascular/Lymphatic: The aorta and branch vessels are patent. The major venous structures are patent. No mesenteric or retroperitoneal mass or adenopathy.  Reproductive: The uterus is surgically absent. No pelvic mass or adenopathy. The bladder appears normal. No inguinal mass or adenopathy.  Musculoskeletal: No significant bony findings.  Other: There is persistent abnormal soft tissue thickening involving the anterior abdominal wall this appears to be the AA thick rim enhancing fluid collection which is likely an abscess draining to the region of the emboli kiss. I do not C8 direct communication with the abdomen or bowel. There is no leaking oral contrast to suggest a fistula but a subtle fistula is possible as several the small bowel loops in the abdomen the contrast in none.  IMPRESSION: Persistent abnormal thick rim enhancing fluid collections in the anterior abdominal wall extending to the skin, likely chronic abscess. No obvious communication with the intra-abdominal contents.  Surgical changes from gastric bypass surgery. Right adrenalectomy is also noted.  No acute or significant intra abdominal/pelvic findings.  Changes of chronic inflammatory bowel disease involving the colon.   Electronically Signed   By: Kalman Jewels M.D.   On: 12/08/2013 21:17   Dg Abd Acute W/chest  12/10/2013   CLINICAL DATA:  Abdominal pain and distention for 5 days  EXAM: ACUTE ABDOMEN SERIES (ABDOMEN 2 VIEW & CHEST 1 VIEW)  COMPARISON:  CT abdomen/ pelvis 12/08/2013  FINDINGS: There is no evidence of dilated bowel loops or free intraperitoneal air. Postsurgical changes in the upper abdomen with multiple surgical clips in the right upper quadrant. Prior gastric bypass. No radiopaque calculi or other significant radiographic abnormality is seen. Heart size and mediastinal contours are within normal limits. Both lungs are clear.  IMPRESSION: Negative abdominal radiographs.  No acute cardiopulmonary disease.   Electronically  Signed   By: Kathreen Devoid   On: 12/10/2013 22:42    CBC  Recent Labs Lab 12/08/13 1933 12/10/13 2018 12/11/13 0559 12/12/13 0556  WBC 6.5 5.7 4.8 4.8  HGB 13.7 12.0 10.4* 9.9*  HCT 38.1 34.2* 29.6* 29.2*  PLT 374 279 240 216  MCV 86.2 87.7 87.8 89.3  MCH 31.0 30.8 30.9 30.3  MCHC 36.0 35.1 35.1 33.9  RDW 15.2 14.9 14.9 15.2  LYMPHSABS 0.6* 2.0  --   --   MONOABS 0.8 1.0  --   --   EOSABS 0.0 0.0  --   --   BASOSABS 0.0 0.0  --   --     Chemistries   Recent Labs Lab 12/08/13 1933 12/08/13 2200 12/10/13 2018 12/11/13 0559 12/12/13 0556  NA 139 137 137 140 139  K 3.8 4.9 3.0* 2.9* 3.5*  CL 91* 94* 94* 102 106  CO2 21 24 29 25 23   GLUCOSE 98 83 85 78 81  BUN 5* 5* 6 6 5*  CREATININE 0.54 0.57 0.54 0.54 0.50  CALCIUM 9.5 8.9 9.2 8.4 8.4  AST 66*  --  69* 55*  --   ALT 41*  --  36* 30  --   ALKPHOS 96  --  88 74  --   BILITOT 0.9  --  1.1 1.0  --    ------------------------------------------------------------------------------------------------------------------ estimated creatinine clearance is 72.7 ml/min (by C-G formula based on Cr of 0.5). ------------------------------------------------------------------------------------------------------------------ No results found for this basename: HGBA1C,  in the last 72 hours ------------------------------------------------------------------------------------------------------------------ No results found for this basename: CHOL, HDL, LDLCALC, TRIG, CHOLHDL, LDLDIRECT,  in the last 72 hours ------------------------------------------------------------------------------------------------------------------ No results found for this basename: TSH, T4TOTAL, FREET3, T3FREE, THYROIDAB,  in the last 72 hours ------------------------------------------------------------------------------------------------------------------ No results found for this basename: VITAMINB12, FOLATE, FERRITIN, TIBC, IRON, RETICCTPCT,  in the last 72  hours  Coagulation profile No results found for this basename: INR, PROTIME,  in the last 168 hours  No results found for this basename: DDIMER,  in the last 72 hours  Cardiac Enzymes No results found for this basename: CK, CKMB, TROPONINI, MYOGLOBIN,  in the last 168 hours ------------------------------------------------------------------------------------------------------------------ No components found with this basename: POCBNP,     Kiauna Zywicki D.O. on 12/12/2013 at 11:06 AM  Between 7am to 7pm - Pager - [phone]  After 7pm go to [email or web address] - password [email or web address]  And look for the night coverage person covering for me after hours  [facility] Office  [phone]

## 2013-12-12 NOTE — Progress Notes (Signed)
INITIAL NUTRITION ASSESSMENT  INTERVENTION: Follow diet advancement and add ProStat 30 ml TID (each 30 ml provides 100 kcal, 15 gr protein) and Resource Breeze po TID, each supplement provides 250 kcal and 9 grams of protein   NUTRITION DIAGNOSIS: Inadequate oral intake related to gastroenteritis as evidenced by nausea, vomiting and diarrhea past 5 days and NPO status.   Goal: Pt to meet >/= 90% of their estimated nutrition needs    Monitor:  Skin assessments, diet advancement, po intake percent of meals and supplements, labs and wt trends   Reason for Assessment: Malnutrition Screen Score =  2  48 y.o. female  Admitting Dx: Gastroenteritis  ASSESSMENT: 48 year old female history of lupus, thyroid disease, hypertension, suicide attempt, chronic abdominal pain and chronic wound infection of the abdomen presented to the emergency department with complaints of nausea, vomiting, diarrhea for approximately 5 days. Patient has a chronic nonhealing wound in the lower anterior abdominal wall since November of 2014.   Height: Ht Readings from Last 1 Encounters:  12/11/13 5\' 3"  (1.6 m)    Weight: Wt Readings from Last 1 Encounters:  12/11/13 124 lb 1.6 oz (56.291 kg)    Ideal Body Weight: 115#  % Ideal Body Weight: 108%  Wt Readings from Last 10 Encounters:  12/11/13 124 lb 1.6 oz (56.291 kg)  12/08/13 132 lb (59.875 kg)  09/29/13 131 lb (59.421 kg)  09/16/13 132 lb (59.875 kg)  08/16/13 129 lb (58.514 kg)  07/27/13 132 lb (59.875 kg)  07/24/13 127 lb (57.607 kg)  07/18/13 127 lb (57.607 kg)  07/16/13 127 lb (57.607 kg)  07/03/13 131 lb (59.421 kg)    Usual Body Weight: 130#  % Usual Body Weight: 95%  BMI:  Body mass index is 21.99 kg/(m^2). normal range  Estimated Nutritional Needs: Kcal: 1700-1900 Protein: 80-90 gr Fluid: 1.7-1.9 liters daily  Skin: chronic abdominal wound  Diet Order: NPO  EDUCATION NEEDS: -No education needs identified at this  time   Intake/Output Summary (Last 24 hours) at 12/12/13 0625 Last data filed at 12/12/13 0600  Gross per 24 hour  Intake 2588.33 ml  Output   1000 ml  Net 1588.33 ml    Last BM: 9/25   Labs:   Recent Labs Lab 12/08/13 2200 12/10/13 2018 12/11/13 0559  NA 137 137 140  K 4.9 3.0* 2.9*  CL 94* 94* 102  CO2 24 29 25   BUN 5* 6 6  CREATININE 0.57 0.54 0.54  CALCIUM 8.9 9.2 8.4  GLUCOSE 83 85 78    CBG (last 3)  No results found for this basename: GLUCAP,  in the last 72 hours  Scheduled Meds: . ARIPiprazole  5 mg Oral BID  . enoxaparin (LOVENOX) injection  40 mg Subcutaneous Q24H  . escitalopram  20 mg Oral Daily  . lisinopril  10 mg Oral Daily  . mupirocin ointment   Nasal BID  . potassium chloride SA  40 mEq Oral Once  . vancomycin  750 mg Intravenous Q12H    Continuous Infusions: . sodium chloride 100 mL/hr at 12/11/13 2336    Past Medical History  Diagnosis Date  . Lupus   . 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 Hypertension   . Abscess     soft tissue  . Suicide attempt   . Blood transfusion without reported diagnosis   . Chronic abdominal pain   . Chronic wound infection of abdomen     Past Surgical History  Procedure Laterality  Date  . Abdominal surgery    . Abdominal hysterectomy  2013    [place]  . Debridement of abdominal wall abscess N/A 02/08/2013    Procedure: DEBRIDEMENT OF ABDOMINAL WALL ABSCESS;  Surgeon: Jamesetta So, MD;  Location: [facility];  Service: General;  Laterality: N/A;  . Gastric bypass       [place], first 2000, 2005/2006.  . Colonoscopy      in [place]  . Cholecystectomy    . Colonoscopy with propofol N/A 05/20/2013    Dr.Rourk- inadequate prep, normal appearing rectum, grossly normal colon aside from pancolonic diverticula, normal terminal ileum bx= unremarkable colonic mucosa  . Esophagogastroduodenoscopy (egd) with propofol N/A 05/20/2013    Dr.Rourk- s/p prior gastric surgery with normal esophagus,  residual gastric mucosa and patent efferent limb  . Esophageal biopsy  05/20/2013    Procedure: BIOPSIES OF ASCENDING AND SIGMOID COLON;  Surgeon: Daneil Dolin, MD;  Location: [facility];  Service: Endoscopy;;  . Wound exploration Right 06/24/2013    Procedure: exploration of traumatic wound right wrist;  Surgeon: Tennis Must, MD;  Location: [facility];  Service: Orthopedics;  Laterality: Right;  . Tendon repar    . Adrenal turmor removal      Colman Cater MS,RD,CSG,LDN Office: [phone] Pager: [phone]

## 2013-12-13 ENCOUNTER — Telehealth: Payer: Self-pay

## 2013-12-13 DIAGNOSIS — F17200 Nicotine dependence, unspecified, uncomplicated: Secondary | ICD-10-CM

## 2013-12-13 DIAGNOSIS — Z9884 Bariatric surgery status: Secondary | ICD-10-CM

## 2013-12-13 LAB — BASIC METABOLIC PANEL
Anion gap: 9 (ref 5–15)
BUN: <3 mg/dL — ABNORMAL LOW (ref 6–23)
CALCIUM: 8.9 mg/dL (ref 8.4–10.5)
CO2: 28 meq/L (ref 19–32)
CREATININE: 0.51 mg/dL (ref 0.50–1.10)
Chloride: 106 mEq/L (ref 96–112)
GFR calc Af Amer: >90 mL/min (ref >90)
GFR calc non Af Amer: >90 mL/min (ref >90)
GLUCOSE: 86 mg/dL (ref 70–99)
Potassium: 3.4 mEq/L — ABNORMAL LOW (ref 3.7–5.3)
Sodium: 143 mEq/L (ref 137–147)

## 2013-12-13 LAB — RAPID URINE DRUG SCREEN, HOSP PERFORMED
AMPHETAMINES: NOT DETECTED
Barbiturates: NOT DETECTED
Benzodiazepines: NOT DETECTED
Cocaine: NOT DETECTED
OPIATES: NOT DETECTED
TETRAHYDROCANNABINOL: NOT DETECTED

## 2013-12-13 LAB — CBC
HCT: 30.2 % — ABNORMAL LOW (ref 36.0–46.0)
HEMOGLOBIN: 10.4 g/dL — AB (ref 12.0–15.0)
MCH: 30.2 pg (ref 26.0–34.0)
MCHC: 34.4 g/dL (ref 30.0–36.0)
MCV: 87.8 fL (ref 78.0–100.0)
Platelets: 211 10*3/uL (ref 150–400)
RBC: 3.44 MIL/uL — AB (ref 3.87–5.11)
RDW: 15.2 % (ref 11.5–15.5)
WBC: 6.9 10*3/uL (ref 4.0–10.5)

## 2013-12-13 MED ORDER — POTASSIUM CHLORIDE 10 MEQ/100ML IV SOLN
10.0000 meq | INTRAVENOUS | Status: AC
  Administered 2013-12-13 (×4): 10 meq via INTRAVENOUS
  Filled 2013-12-13: qty 100

## 2013-12-13 MED ORDER — OXYCODONE HCL 5 MG PO TABS: 5.0000 mg | ORAL_TABLET | ORAL | Status: DC | PRN

## 2013-12-13 MED ORDER — PROMETHAZINE HCL 12.5 MG PO TABS: 12.5000 mg | ORAL_TABLET | Freq: Four times a day (QID) | ORAL | Status: DC | PRN

## 2013-12-13 MED ORDER — POTASSIUM CHLORIDE CRYS ER 20 MEQ PO TBCR
40.0000 meq | EXTENDED_RELEASE_TABLET | Freq: Two times a day (BID) | ORAL | Status: DC
  Filled 2013-12-13: qty 2

## 2013-12-13 MED ORDER — MUPIROCIN 2 % EX OINT: TOPICAL_OINTMENT | Freq: Two times a day (BID) | CUTANEOUS | Status: DC

## 2013-12-13 NOTE — Discharge Summary (Signed)
Physician Discharge Summary  Angel Price RXV:[id number] DOB: 1965-11-04 DOA: 12/10/2013  PCP: Doran Heater, MD  Admit date: 12/10/2013 Discharge date: 12/13/2013  Time spent: 45 minutes  Recommendations for Outpatient Follow-up:  Patient will be discharged to home.  She is to follow up with her primary care physician within one week of discharge, she should also have a repeat BMP at that time.  Patient is to continue her medications as prescribed.  She may resume normal activity as tolerated. Patient was counseled on her diet. She was strongly urged to followup with the surgeon that preformed her gastric bypass.   Discharge Diagnoses:  Abdominal pain with nausea, vomiting, diarrhea secondary to gastroenteritis likely viral Chronic abdominal wall abscess Hypokalemia Hypertension Normocytic Anemia   Discharge Condition: Stable   Diet recommendation: Soft bland diet  Filed Weights   12/10/13 1750 12/11/13 0044  Weight: 55.792 kg (123 lb) 56.291 kg (124 lb 1.6 oz)    History of present illness:  on 12/10/2013  48 year old female history of lupus, thyroid disease, hypertension, suicide attempt, chronic abdominal pain and chronic wound infection of the abdomen presented to the emergency department with complaints of nausea, vomiting, diarrhea for approximately 5 days. Patient has a chronic nonhealing wound in the lower anterior abdominal wall since November of 2014. She has had multiple abdominal surgeries. She was recently seen at [facility] this past Friday and has an appointment with the general surgeon as an outpatient. Patient stated her wound started oozing pus on the morning of admission and she also complained of abdominal pain. CT of the abdomen and pelvis was done in the emergency department showing persistent abnormal thick rim enhancing fluid collections in the anterior abdominal wall extending to the skin likely chronic abscess. Patient admitted to having chills and fever. She was  also seen in the emergency department on September 23 and at that time was discharged with doxycycline. She was unable to take her antibiotics due to nausea and vomiting. She admit to having 6-8 loose bowel movements every day. Patient denies any blood in her stool or vomitus.  Hospital Course:  Abdominal pain with nausea, vomiting and diarrhea secondary to gastroenteritis  -Likely viral, patient remains afebrile no leukocytosis. -Diarrhea has resolved with no intervention, no vomiting.  -Patient is unable to tolerate a clear liquid diet, with transfer to a soft bland diet. Patient was given information regarding her diet and intake.   -Patient to continue pain medication as needed, she will be discharged with a finite amount of pain medication. -Continue antiemetics as needed   Chronic abdominal wall abscess  -Has been ongoing since Nov 2014  -Wound culture shows rare WBCs present, no squamous epithelial cells no organisms. -Vancomycin was started empirically and discontinued -General surgery consulted and appreciated. It is not felt that patient has an enterocutaneous fistula.  -Patient has had several surgeries in the past, and has poor wound healing.  -patient was urged to follow up with the surgeon that preformed her gastric bypass  Hypokalemia  -Possibly secondary to GI losses  -Replaced.  Hypertension  -Continue lisinopril   Normocytic anemia  -Likely dilutional as patient has been receiving IVF.  -Hemoglobin has remained stable -No evidence of bleeding, patient is hemodynamically stable  Procedures:  None  Consultations:  General surgery  Discharge Exam: Filed Vitals:   12/13/13 0630  BP: 122/82  Pulse: 73  Temp: 98.5 F (36.9 C)  Resp: 20   Exam  General: Well developed, well nourished, NAD, appears stated  age  30: NCAT, mucous membranes moist.  Cardiovascular: S1 S2 auscultated, no rubs, murmurs or gallops. Regular rate and rhythm.  Respiratory: Clear  to auscultation bilaterally with equal chest rise  Abdomen: Soft, nontender, nondistended, + bowel sounds. Midline incision. Small open wound, minimal drainage.  Dressing in place. Extremities: warm dry without cyanosis clubbing or edema  Neuro: AAOx3, no focal deficits  Psych: Normal affect and demeanor   Discharge Instructions      Discharge Instructions   Discharge instructions    Complete by:  As directed   Patient will be discharged to home.  She is to follow up with her primary care physician within one week of discharge, she should also have a repeat BMP at that time.  Patient is to continue her medications as prescribed.  She may resume normal activity as tolerated. Patient was counseled on her diet. She was strongly urged to followup with the surgeon that preformed her gastric bypass.            Medication List    STOP taking these medications       doxycycline 100 MG capsule  Commonly known as:  VIBRAMYCIN      TAKE these medications       ARIPiprazole 5 MG tablet  Commonly known as:  ABILIFY  Take 1 tablet (5 mg total) by mouth 2 (two) times daily.     escitalopram 20 MG tablet  Commonly known as:  LEXAPRO  Take 1 tablet (20 mg total) by mouth daily.     ibuprofen 200 MG tablet  Commonly known as:  ADVIL,MOTRIN  Take 400 mg by mouth 2 (two) times daily as needed for moderate pain.     lisinopril 10 MG tablet  Commonly known as:  PRINIVIL,ZESTRIL  Take 10 mg by mouth daily.     mupirocin ointment 2 %  Commonly known as:  BACTROBAN  Place into the nose 2 (two) times daily.     ondansetron 4 MG tablet  Commonly known as:  ZOFRAN  Take 1 tablet (4 mg total) by mouth every 6 (six) hours.     oxyCODONE 5 MG immediate release tablet  Commonly known as:  Oxy IR/ROXICODONE  Take 1 tablet (5 mg total) by mouth every 3 (three) hours as needed for severe pain.     promethazine 12.5 MG tablet  Commonly known as:  PHENERGAN  Take 1 tablet (12.5 mg total) by  mouth every 6 (six) hours as needed for nausea.       Allergies  Allergen Reactions  . Codeine Hives  . Morphine And Related     gastritis   Follow-up Information   Follow up with Doran Heater, MD. Schedule an appointment as soon as possible for a visit in 1 week. [facility] followup)    Specialty:  Family Medicine       The results of significant diagnostics from this hospitalization (including imaging, microbiology, ancillary and laboratory) are listed below for reference.    Significant Diagnostic Studies: Ct Abdomen Pelvis W Contrast  12/08/2013   CLINICAL DATA:  Draining abdominal wound.  EXAM: CT ABDOMEN AND PELVIS WITH CONTRAST  TECHNIQUE: Multidetector CT imaging of the abdomen and pelvis was performed using the standard protocol following bolus administration of intravenous contrast.  CONTRAST:  144mL OMNIPAQUE IOHEXOL 300 MG/ML  SOLN  COMPARISON:  06/06/2013.  FINDINGS: Lower chest: The lung bases are clear. No pleural effusion. The heart is normal in size. No pericardial effusion. The  distal esophagus is grossly normal. Surgical changes are noted near the GE junction.  Hepatobiliary: Diffuse fatty infiltration of the liver but no focal hepatic lesions or intrahepatic biliary dilatation. The gallbladder is surgically absent. There is stable moderate common bile duct dilatation.  Spleen: Normal size.  No focal lesions.  Pancreas: Normal and stable.  Stomach/Bowel: Surgical changes from gastric bypass surgery. No complicating features are demonstrated. These small bowel and colon appear stable. Fibrofatty infiltrative changes involving the entire colon consistent with remote inflammatory bowel disease. No active inflammation is demonstrated.  Adrenals/urinary tract: The left adrenal gland adenoma is stable. The right adrenal gland is surgically absent. Both kidneys are unremarkable.  Vascular/Lymphatic: The aorta and branch vessels are patent. The major venous structures are patent. No  mesenteric or retroperitoneal mass or adenopathy.  Reproductive: The uterus is surgically absent. No pelvic mass or adenopathy. The bladder appears normal. No inguinal mass or adenopathy.  Musculoskeletal: No significant bony findings.  Other: There is persistent abnormal soft tissue thickening involving the anterior abdominal wall this appears to be the AA thick rim enhancing fluid collection which is likely an abscess draining to the region of the emboli kiss. I do not C8 direct communication with the abdomen or bowel. There is no leaking oral contrast to suggest a fistula but a subtle fistula is possible as several the small bowel loops in the abdomen the contrast in none.  IMPRESSION: Persistent abnormal thick rim enhancing fluid collections in the anterior abdominal wall extending to the skin, likely chronic abscess. No obvious communication with the intra-abdominal contents.  Surgical changes from gastric bypass surgery. Right adrenalectomy is also noted.  No acute or significant intra abdominal/pelvic findings.  Changes of chronic inflammatory bowel disease involving the colon.   Electronically Signed   By: Kalman Jewels M.D.   On: 12/08/2013 21:17   Dg Abd Acute W/chest  12/10/2013   CLINICAL DATA:  Abdominal pain and distention for 5 days  EXAM: ACUTE ABDOMEN SERIES (ABDOMEN 2 VIEW & CHEST 1 VIEW)  COMPARISON:  CT abdomen/ pelvis 12/08/2013  FINDINGS: There is no evidence of dilated bowel loops or free intraperitoneal air. Postsurgical changes in the upper abdomen with multiple surgical clips in the right upper quadrant. Prior gastric bypass. No radiopaque calculi or other significant radiographic abnormality is seen. Heart size and mediastinal contours are within normal limits. Both lungs are clear.  IMPRESSION: Negative abdominal radiographs.  No acute cardiopulmonary disease.   Electronically Signed   By: Kathreen Devoid   On: 12/10/2013 22:42    Microbiology: Recent Results (from the past 240  hour(s))  WOUND CULTURE     Status: None   Collection Time    12/08/13  7:40 PM      Result Value Ref Range Status   Specimen Description WOUND ABDOMEN   Final   Special Requests IMMUNE:COMPROMISED   Final   Gram Stain     Final   Value: RARE WBC PRESENT, PREDOMINANTLY PMN     NO SQUAMOUS EPITHELIAL CELLS SEEN     NO ORGANISMS SEEN     Performed at [facility]   Culture     Final   Value: MODERATE DIPHTHEROIDS(CORYNEBACTERIUM SPECIES)     Note: Standardized susceptibility testing for this organism is not available.     Performed at [facility]   Report Status 12/10/2013 FINAL   Final  WOUND CULTURE     Status: None   Collection Time    12/11/13  3:10 AM      Result Value Ref Range Status   Specimen Description WOUND   Final   Special Requests NONE   Final   Gram Stain     Final   Value: RARE WBC PRESENT, PREDOMINANTLY PMN     NO SQUAMOUS EPITHELIAL CELLS SEEN     NO ORGANISMS SEEN     Performed at [facility]   Culture     Final   Value: Culture reincubated for better growth     Performed at [facility]   Report Status PENDING   Incomplete     Labs: Basic Metabolic Panel:  Recent Labs Lab 12/08/13 2200 12/10/13 2018 12/11/13 0559 12/12/13 0556 12/13/13 0543  NA 137 137 140 139 143  K 4.9 3.0* 2.9* 3.5* 3.4*  CL 94* 94* 102 106 106  CO2 24 29 25 23 28   GLUCOSE 83 85 78 81 86  BUN 5* 6 6 5* <3*  CREATININE 0.57 0.54 0.54 0.50 0.51  CALCIUM 8.9 9.2 8.4 8.4 8.9   Liver Function Tests:  Recent Labs Lab 12/08/13 1933 12/10/13 2018 12/11/13 0559  AST 66* 69* 55*  ALT 41* 36* 30  ALKPHOS 96 88 74  BILITOT 0.9 1.1 1.0  PROT 8.4* 7.1 6.0  ALBUMIN 4.3 3.5 2.9*    Recent Labs Lab 12/08/13 1933 12/10/13 2018  LIPASE 12 51   No results found for this basename: AMMONIA,  in the last 168 hours CBC:  Recent Labs Lab 12/08/13 1933 12/10/13 2018 12/11/13 0559 12/12/13 0556 12/13/13 0543  WBC 6.5 5.7 4.8 4.8 6.9    NEUTROABS 5.1 3.0  --   --   --   HGB 13.7 12.0 10.4* 9.9* 10.4*  HCT 38.1 34.2* 29.6* 29.2* 30.2*  MCV 86.2 87.7 87.8 89.3 87.8  PLT 374 279 240 216 211   Cardiac Enzymes: No results found for this basename: CKTOTAL, CKMB, CKMBINDEX, TROPONINI,  in the last 168 hours BNP: BNP (last 3 results) No results found for this basename: PROBNP,  in the last 8760 hours CBG: No results found for this basename: GLUCAP,  in the last 168 hours     Signed:  Cristal Ford  [facility] 12/13/2013, 11:54 AM

## 2013-12-13 NOTE — Telephone Encounter (Signed)
Yes, may add a BMP.

## 2013-12-13 NOTE — Progress Notes (Signed)
Patient okay to discharge as per Dr. Ree Kida. Patient verbalizes desire to go home.  Discharge instructions given at bedside and prescriptions given to patient. Family at bedside.  All belongings taken with patient. Patient taken down in wheelchair and vitals stable.

## 2013-12-13 NOTE — Care Management Note (Addendum)
    Page 1 of 1   12/13/2013     12:45:41 PM CARE MANAGEMENT NOTE 12/13/2013  Patient:  Angel Price, Angel Price   Account Number:  [id number]  Date Initiated:  12/13/2013  Documentation initiated by:  Theophilus Kinds  Subjective/Objective Assessment:   Pt admitted from home with abd pain. Pt lives with her family and will return home at discharge. Pt is indepdent with ADL's. Pt stated that prior PCP has moved and pt needs new PCP.     Action/Plan:   Pt does not want to follow up in [facility] PCP. Dr. Cindie Laroche is booked up until end of Novemeber but office will call pt if earlier appt is obtained. Pt made aware. No other CM needs noted. BMP to be checked next week with labs for   Anticipated DC Date:  12/13/2013   Anticipated DC Plan:  [place]  CM consult      Choice offered to / List presented to:             Status of service:  Completed, signed off Medicare Important Message given?  YES (If response is "NO", the following Medicare IM given date fields will be blank) Date Medicare IM given:  12/13/2013 Medicare IM given by:  Theophilus Kinds Date Additional Medicare IM given:   Additional Medicare IM given by:    Discharge Disposition:  HOME/SELF CARE  Per UR Regulation:    If discussed at Long Length of Stay Meetings, dates discussed:    Comments:  12/13/13 [place], RN BSN CM appt with Dr. Oneida Alar.

## 2013-12-13 NOTE — Discharge Instructions (Signed)

## 2013-12-13 NOTE — Telephone Encounter (Signed)
T/C from Tammy in Case Management at [facility], said that pt has appt her next week and she is supposed to have labs for Dr. Oneida Alar to check her liver. They would like to add a BMET since pt does not have a PCP at this time. She is supposed to see Dr. Cindie Laroche in [facility] and they will call if they have anything before then. He is the only one that she qualifies to see.   Pt does have an APPT here with Laban Emperor, NP on 12/23/2013 at 9:00 AM.  I will check with her and let Tammy know at [phone].

## 2013-12-13 NOTE — Progress Notes (Signed)
Patient on telemetry and in sinus rhythm for majority of day.  10 minutes post administration of IV Benedryl and IV Phenergan with slow flush, patient heart rate in 140s.  When entering room, patient is fumbling with pants and clothes and complains of dizziness.  HR on monitor in the 140s.  Upon manual pulse check, pulse rate of 136 confirmed. Stat EKG performed at bedside.  Informed dr. Ree Kida of patient status and discharge cancelled.  Urine drug screen ordered.  Patient agitated in room and verbalizes " If there is something in my urine, it is because you gave it to me". Patient educated on protocol and educated that due to changes in heart rate, discharge is cancelled and the patient will need to be monitored overnight.  Patient remains agitated. Will continue to monitor and educate.

## 2013-12-13 NOTE — Telephone Encounter (Signed)
Called and informed Tammy.

## 2013-12-17 DIAGNOSIS — T799XXA Unspecified early complication of trauma, initial encounter: Secondary | ICD-10-CM | POA: Diagnosis not present

## 2013-12-17 DIAGNOSIS — T798XXA Other early complications of trauma, initial encounter: Secondary | ICD-10-CM | POA: Diagnosis not present

## 2013-12-17 LAB — URINE CULTURE

## 2013-12-17 LAB — GI PATHOGEN PANEL BY PCR, STOOL
C difficile toxin A/B: NEGATIVE
CAMPYLOBACTER BY PCR: NEGATIVE
Cryptosporidium by PCR: NEGATIVE
E COLI (ETEC) LT/ST: NEGATIVE
E COLI (STEC): NEGATIVE
E coli 0157 by PCR: NEGATIVE
G lamblia by PCR: NEGATIVE
NOROVIRUS G1/G2: NEGATIVE
ROTAVIRUS A BY PCR: NEGATIVE
Salmonella by PCR: NEGATIVE
Shigella by PCR: NEGATIVE

## 2013-12-17 LAB — WOUND CULTURE

## 2013-12-21 DIAGNOSIS — F17210 Nicotine dependence, cigarettes, uncomplicated: Secondary | ICD-10-CM | POA: Diagnosis not present

## 2013-12-21 DIAGNOSIS — Z79899 Other long term (current) drug therapy: Secondary | ICD-10-CM | POA: Diagnosis not present

## 2013-12-21 DIAGNOSIS — M329 Systemic lupus erythematosus, unspecified: Secondary | ICD-10-CM | POA: Diagnosis not present

## 2013-12-21 DIAGNOSIS — T8131XA Disruption of external operation (surgical) wound, not elsewhere classified, initial encounter: Secondary | ICD-10-CM | POA: Diagnosis not present

## 2013-12-21 DIAGNOSIS — K589 Irritable bowel syndrome without diarrhea: Secondary | ICD-10-CM | POA: Diagnosis not present

## 2013-12-21 DIAGNOSIS — T798XXA Other early complications of trauma, initial encounter: Secondary | ICD-10-CM | POA: Diagnosis not present

## 2013-12-21 DIAGNOSIS — I10 Essential (primary) hypertension: Secondary | ICD-10-CM | POA: Diagnosis not present

## 2013-12-21 DIAGNOSIS — E279 Disorder of adrenal gland, unspecified: Secondary | ICD-10-CM | POA: Diagnosis not present

## 2013-12-21 DIAGNOSIS — R112 Nausea with vomiting, unspecified: Secondary | ICD-10-CM | POA: Diagnosis not present

## 2013-12-23 ENCOUNTER — Ambulatory Visit: Payer: Self-pay | Admitting: Gastroenterology

## 2013-12-24 DIAGNOSIS — R112 Nausea with vomiting, unspecified: Secondary | ICD-10-CM | POA: Diagnosis not present

## 2013-12-28 DIAGNOSIS — R110 Nausea: Secondary | ICD-10-CM | POA: Diagnosis not present

## 2013-12-28 DIAGNOSIS — Z720 Tobacco use: Secondary | ICD-10-CM | POA: Diagnosis not present

## 2013-12-28 DIAGNOSIS — E279 Disorder of adrenal gland, unspecified: Secondary | ICD-10-CM | POA: Diagnosis not present

## 2013-12-28 DIAGNOSIS — E876 Hypokalemia: Secondary | ICD-10-CM | POA: Diagnosis not present

## 2013-12-28 DIAGNOSIS — R112 Nausea with vomiting, unspecified: Secondary | ICD-10-CM | POA: Diagnosis not present

## 2013-12-28 DIAGNOSIS — M545 Low back pain: Secondary | ICD-10-CM | POA: Diagnosis not present

## 2013-12-28 DIAGNOSIS — R197 Diarrhea, unspecified: Secondary | ICD-10-CM | POA: Diagnosis not present

## 2013-12-28 DIAGNOSIS — R1084 Generalized abdominal pain: Secondary | ICD-10-CM | POA: Diagnosis not present

## 2014-01-03 ENCOUNTER — Emergency Department (HOSPITAL_COMMUNITY)
Admission: EM | Admit: 2014-01-03 | Discharge: 2014-01-03 | Disposition: A | Discharge status: Home / Self Care | Payer: Medicare Other | Attending: Emergency Medicine | Admitting: Emergency Medicine

## 2014-01-03 ENCOUNTER — Emergency Department (HOSPITAL_COMMUNITY): Payer: Medicare Other

## 2014-01-03 ENCOUNTER — Encounter (HOSPITAL_COMMUNITY): Payer: Self-pay | Admitting: Emergency Medicine

## 2014-01-03 DIAGNOSIS — Z8639 Personal history of other endocrine, nutritional and metabolic disease: Secondary | ICD-10-CM | POA: Insufficient documentation

## 2014-01-03 DIAGNOSIS — R112 Nausea with vomiting, unspecified: Secondary | ICD-10-CM | POA: Diagnosis not present

## 2014-01-03 DIAGNOSIS — R109 Unspecified abdominal pain: Secondary | ICD-10-CM | POA: Insufficient documentation

## 2014-01-03 DIAGNOSIS — Z720 Tobacco use: Secondary | ICD-10-CM | POA: Diagnosis not present

## 2014-01-03 DIAGNOSIS — Z9889 Other specified postprocedural states: Secondary | ICD-10-CM | POA: Diagnosis not present

## 2014-01-03 DIAGNOSIS — Z872 Personal history of diseases of the skin and subcutaneous tissue: Secondary | ICD-10-CM | POA: Insufficient documentation

## 2014-01-03 DIAGNOSIS — R197 Diarrhea, unspecified: Secondary | ICD-10-CM | POA: Diagnosis not present

## 2014-01-03 DIAGNOSIS — Z9049 Acquired absence of other specified parts of digestive tract: Secondary | ICD-10-CM | POA: Insufficient documentation

## 2014-01-03 DIAGNOSIS — Z79899 Other long term (current) drug therapy: Secondary | ICD-10-CM | POA: Diagnosis not present

## 2014-01-03 DIAGNOSIS — G8929 Other chronic pain: Secondary | ICD-10-CM | POA: Insufficient documentation

## 2014-01-03 DIAGNOSIS — Z792 Long term (current) use of antibiotics: Secondary | ICD-10-CM | POA: Insufficient documentation

## 2014-01-03 DIAGNOSIS — I10 Essential (primary) hypertension: Secondary | ICD-10-CM | POA: Diagnosis not present

## 2014-01-03 DIAGNOSIS — Z9884 Bariatric surgery status: Secondary | ICD-10-CM | POA: Diagnosis not present

## 2014-01-03 LAB — COMPREHENSIVE METABOLIC PANEL
ALBUMIN: 3.9 g/dL (ref 3.5–5.2)
ALT: 19 U/L (ref 0–35)
AST: 29 U/L (ref 0–37)
Alkaline Phosphatase: 99 U/L (ref 39–117)
Anion gap: 20 — ABNORMAL HIGH (ref 5–15)
BUN: 8 mg/dL (ref 6–23)
CO2: 20 mEq/L (ref 19–32)
Calcium: 9.2 mg/dL (ref 8.4–10.5)
Chloride: 99 mEq/L (ref 96–112)
Creatinine, Ser: 0.48 mg/dL — ABNORMAL LOW (ref 0.50–1.10)
GFR calc non Af Amer: >90 mL/min (ref >90)
GLUCOSE: 92 mg/dL (ref 70–99)
Potassium: 3.6 mEq/L — ABNORMAL LOW (ref 3.7–5.3)
SODIUM: 139 meq/L (ref 137–147)
TOTAL PROTEIN: 7.7 g/dL (ref 6.0–8.3)
Total Bilirubin: 1.2 mg/dL (ref 0.3–1.2)

## 2014-01-03 LAB — CBC WITH DIFFERENTIAL/PLATELET
Basophils Absolute: 0 10*3/uL (ref 0.0–0.1)
Basophils Relative: 0 % (ref 0–1)
Eosinophils Absolute: 0 10*3/uL (ref 0.0–0.7)
Eosinophils Relative: 1 % (ref 0–5)
HCT: 40.6 % (ref 36.0–46.0)
Hemoglobin: 14.5 g/dL (ref 12.0–15.0)
LYMPHS ABS: 2.2 10*3/uL (ref 0.7–4.0)
Lymphocytes Relative: 48 % — ABNORMAL HIGH (ref 12–46)
MCH: 30.5 pg (ref 26.0–34.0)
MCHC: 35.7 g/dL (ref 30.0–36.0)
MCV: 85.3 fL (ref 78.0–100.0)
Monocytes Absolute: 0.5 10*3/uL (ref 0.1–1.0)
Monocytes Relative: 10 % (ref 3–12)
NEUTROS PCT: 41 % — AB (ref 43–77)
Neutro Abs: 1.8 10*3/uL (ref 1.7–7.7)
Platelets: 304 10*3/uL (ref 150–400)
RBC: 4.76 MIL/uL (ref 3.87–5.11)
RDW: 14.8 % (ref 11.5–15.5)
WBC: 4.5 10*3/uL (ref 4.0–10.5)

## 2014-01-03 LAB — LIPASE, BLOOD: Lipase: 16 U/L (ref 11–59)

## 2014-01-03 MED ORDER — HYDROMORPHONE HCL 1 MG/ML IJ SOLN
1.0000 mg | Freq: Once | INTRAMUSCULAR | Status: AC
  Administered 2014-01-03: 1 mg via INTRAVENOUS
  Filled 2014-01-03: qty 1

## 2014-01-03 MED ORDER — ONDANSETRON HCL 4 MG/2ML IJ SOLN
4.0000 mg | Freq: Once | INTRAMUSCULAR | Status: AC
  Administered 2014-01-03: 4 mg via INTRAVENOUS
  Filled 2014-01-03: qty 2

## 2014-01-03 MED ORDER — ONDANSETRON HCL 4 MG/2ML IJ SOLN
INTRAMUSCULAR | Status: AC
  Filled 2014-01-03: qty 2

## 2014-01-03 MED ORDER — OXYCODONE-ACETAMINOPHEN 5-325 MG PO TABS: 2.0000 | ORAL_TABLET | ORAL | Status: DC | PRN

## 2014-01-03 MED ORDER — SODIUM CHLORIDE 0.9 % IV BOLUS (SEPSIS)
1000.0000 mL | Freq: Once | INTRAVENOUS | Status: AC
  Administered 2014-01-03: 1000 mL via INTRAVENOUS

## 2014-01-03 NOTE — ED Provider Notes (Signed)
CSN: [id number]     Arrival date & time 01/03/14  1948 History  This chart was scribed for Veryl Speak, MD by Edison Simon, ED Scribe. This patient was seen in room APA08/APA08 and the patient's care was started at 9:08 PM.    Chief Complaint  Patient presents with  . Nausea  . Emesis  . Diarrhea  . Abdominal Pain   Patient is a 48 y.o. female presenting with vomiting, diarrhea, and abdominal pain. The history is provided by the patient. No language interpreter was used.  Emesis Severity:  Moderate Duration:  2 days Chronicity:  New Relieved by:  Nothing Worsened by:  Nothing tried Ineffective treatments:  None tried Associated symptoms: abdominal pain and diarrhea   Diarrhea Associated symptoms: abdominal pain and vomiting   Abdominal Pain Associated symptoms: diarrhea and vomiting     HPI Comments: Angel Price is a 48 y.o. female who presents to the Emergency Department complaining of nausea, vomiting, and diarrhea with onset 2 days ago. She denies blood in her stool. She also reports periumbilical abdominal pain with onset upon waking today, gradually worsening over the course of the day. She states she has constant waxing and waning abdominal pain, but not as bad as today. She reports extensive history of abdominal problems. She states she last had abdominal surgery 11 months ago with Dr. Arnoldo Morale. She states a mass was recently found on her left adrenal gland and that her right adrenal gland was removed for cancer treatment.  Past Medical History  Diagnosis Date  . Lupus   . 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 Hypertension   . Abscess     soft tissue  . Suicide attempt   . Blood transfusion without reported diagnosis   . Chronic abdominal pain   . Chronic wound infection of abdomen    Past Surgical History  Procedure Laterality Date  . Abdominal surgery    . Abdominal hysterectomy  2013    [place]  . Debridement of abdominal wall abscess N/A  02/08/2013    Procedure: DEBRIDEMENT OF ABDOMINAL WALL ABSCESS;  Surgeon: Jamesetta So, MD;  Location: [facility];  Service: General;  Laterality: N/A;  . Gastric bypass       [place], first 2000, 2005/2006.  . Colonoscopy      in [place]  . Cholecystectomy    . Colonoscopy with propofol N/A 05/20/2013    Dr.Rourk- inadequate prep, normal appearing rectum, grossly normal colon aside from pancolonic diverticula, normal terminal ileum bx= unremarkable colonic mucosa  . Esophagogastroduodenoscopy (egd) with propofol N/A 05/20/2013    Dr.Rourk- s/p prior gastric surgery with normal esophagus, residual gastric mucosa and patent efferent limb  . Esophageal biopsy  05/20/2013    Procedure: BIOPSIES OF ASCENDING AND SIGMOID COLON;  Surgeon: Daneil Dolin, MD;  Location: [facility];  Service: Endoscopy;;  . Wound exploration Right 06/24/2013    Procedure: exploration of traumatic wound right wrist;  Surgeon: Tennis Must, MD;  Location: [facility];  Service: Orthopedics;  Laterality: Right;  . Tendon repar    . Adrenal turmor removal     Family History  Problem Relation Age of Onset  . Brain cancer Son   . Schizophrenia Son   . Cancer Son     brain  . Breast cancer Maternal Aunt   . Bipolar disorder Maternal Aunt   . Drug abuse Maternal Aunt   . Colon cancer Maternal Grandmother     late  74s, early 37s  . Lung cancer Father   . Cancer Father     mets  . Liver disease Neg Hx   . Drug abuse Mother   . Drug abuse Sister   . Drug abuse Brother   . Bipolar disorder Paternal Grandfather   . Bipolar disorder Cousin    History  Substance Use Topics  . Smoking status: Current Every Day Smoker -- 0.50 packs/day for 35 years    Types: Cigarettes  . Smokeless tobacco: Never Used  . Alcohol Use: Yes     Comment: daily   OB History   Grav Para Term Preterm Abortions TAB SAB Ect Mult Living                 Review of Systems  Gastrointestinal: Positive for vomiting, abdominal pain and diarrhea.   A  complete 10 system review of systems was obtained and all systems are negative except as noted in the HPI and PMH.    Allergies  Codeine and Morphine and related  Home Medications   Prior to Admission medications   Medication Sig Start Date End Date Taking? Authorizing Provider  ARIPiprazole (ABILIFY) 5 MG tablet Take 1 tablet (5 mg total) by mouth 2 (two) times daily. 07/16/13  Yes Levonne Spiller, MD  doxycycline (VIBRAMYCIN) 100 MG capsule Take 100 mg by mouth 2 (two) times daily.   Yes Historical Provider, MD  escitalopram (LEXAPRO) 20 MG tablet Take 1 tablet (20 mg total) by mouth daily. 07/16/13 07/16/14 Yes Levonne Spiller, MD  lisinopril (PRINIVIL,ZESTRIL) 10 MG tablet Take 10 mg by mouth daily.   Yes Historical Provider, MD  mupirocin ointment (BACTROBAN) 2 % Place into the nose 2 (two) times daily. 12/13/13  Yes Maryann Mikhail, DO  promethazine (PHENERGAN) 12.5 MG tablet Take 1 tablet (12.5 mg total) by mouth every 6 (six) hours as needed for nausea. 12/13/13  Yes Maryann Mikhail, DO  oxyCODONE (OXY IR/ROXICODONE) 5 MG immediate release tablet Take 1 tablet (5 mg total) by mouth every 3 (three) hours as needed for severe pain. 12/13/13   Maryann Mikhail, DO   BP 124/107  Pulse 130  Temp(Src) 98 F (36.7 C) (Oral)  Resp 24  Ht 5\' 3"  (1.6 m)  Wt 134 lb (60.782 kg)  BMI 23.74 kg/m2  SpO2 99% Physical Exam  Nursing note and vitals reviewed. Constitutional: She is oriented to person, place, and time. She appears well-developed and well-nourished.  HENT:  Head: Normocephalic and atraumatic.  Eyes: Conjunctivae and EOM are normal.  Neck: Normal range of motion. Neck supple.  Cardiovascular: Normal rate, regular rhythm and normal heart sounds.   No murmur heard. Pulmonary/Chest: Effort normal and breath sounds normal. No respiratory distress. She has no wheezes. She has no rales.  Abdominal: Soft. Bowel sounds are normal. She exhibits no distension. There is tenderness. There is no rebound  and no guarding.  Exam of the abdomen reveals multiple surgical scars. There is tenderness to palpation in the periumbilical region.  Musculoskeletal: Normal range of motion.  Neurological: She is alert and oriented to person, place, and time.  Skin: Skin is warm and dry.  Psychiatric: She has a normal mood and affect.    ED Course  Procedures (including critical care time) Labs Review Labs Reviewed - No data to display  Imaging Review No results found.   EKG Interpretation None     DIAGNOSTIC STUDIES: Oxygen Saturation is 99% on room air, normal by my interpretation.  COORDINATION OF CARE: 9:12 PM Discussed treatment plan with patient at beside, the patient agrees with the plan and has no further questions at this time.   MDM   Final diagnoses:  None    Patient is a 48 year old female with history of multiple abdominal surgeries with chronic abdominal pain. She presents today with an exacerbation of this pain is associated with vomiting and diarrhea. Her laboratory studies are unremarkable and x-rays reveal no evidence for obstruction. I see nothing emergently surgical in these symptoms may well be viral in nature. Either way she appears appropriate for discharge. She has followup tomorrow with her surgeon in [place] and I will advise her to keep this appointment. I did speak with Dr. Arnoldo Morale from general surgery regarding this patient and he does not feel as though there is anything emergently surgical either.  I personally performed the services described in this documentation, which was scribed in my presence. The recorded information has been reviewed and is accurate.      Veryl Speak, MD 01/03/14 2251

## 2014-01-03 NOTE — ED Notes (Signed)
Pt c/o N/V/D x 2 days. Pt seen regularily concerning drain in her abdomen. Reports she feels a bump in her mid abdomen which is painful. Pt denies any issues with abdominal drain.

## 2014-01-03 NOTE — Discharge Instructions (Signed)
Followup with your general surgeon.  Return to the emergency department if he develops severe pain, bloody stool, high fever, or other new and concerning symptoms.   Abdominal Pain Many things can cause abdominal pain. Usually, abdominal pain is not caused by a disease and will improve without treatment. It can often be observed and treated at home. Your health care provider will do a physical exam and possibly order blood tests and X-rays to help determine the seriousness of your pain. However, in many cases, more time must pass before a clear cause of the pain can be found. Before that point, your health care provider may not know if you need more testing or further treatment. HOME CARE INSTRUCTIONS  Monitor your abdominal pain for any changes. The following actions may help to alleviate any discomfort you are experiencing:  Only take over-the-counter or prescription medicines as directed by your health care provider.  Do not take laxatives unless directed to do so by your health care provider.  Try a clear liquid diet (broth, tea, or water) as directed by your health care provider. Slowly move to a bland diet as tolerated. SEEK MEDICAL CARE IF:  You have unexplained abdominal pain.  You have abdominal pain associated with nausea or diarrhea.  You have pain when you urinate or have a bowel movement.  You experience abdominal pain that wakes you in the night.  You have abdominal pain that is worsened or improved by eating food.  You have abdominal pain that is worsened with eating fatty foods.  You have a fever. SEEK IMMEDIATE MEDICAL CARE IF:   Your pain does not go away within 2 hours.  You keep throwing up (vomiting).  Your pain is felt only in portions of the abdomen, such as the right side or the left lower portion of the abdomen.  You pass bloody or black tarry stools. MAKE SURE YOU:  Understand these instructions.   Will watch your condition.   Will get help  right away if you are not doing well or get worse.  Document Released: 12/12/2004 Document Revised: 03/09/2013 Document Reviewed: 11/11/2012 [facility] Patient Information 2015 [place], [place]. This information is not intended to replace advice given to you by your health care provider. Make sure you discuss any questions you have with your health care provider.

## 2014-01-03 NOTE — ED Notes (Signed)
Discharge instructions given, pt demonstrated teach back and verbal understanding. No concerns voiced.  

## 2014-01-14 ENCOUNTER — Encounter (HOSPITAL_COMMUNITY): Payer: Self-pay | Admitting: Emergency Medicine

## 2014-01-14 ENCOUNTER — Emergency Department (HOSPITAL_COMMUNITY)
Admission: EM | Admit: 2014-01-14 | Discharge: 2014-01-14 | Disposition: A | Discharge status: Home / Self Care | Payer: Medicare Other | Attending: Emergency Medicine | Admitting: Emergency Medicine

## 2014-01-14 ENCOUNTER — Emergency Department (HOSPITAL_COMMUNITY): Payer: Medicare Other

## 2014-01-14 DIAGNOSIS — Z792 Long term (current) use of antibiotics: Secondary | ICD-10-CM | POA: Insufficient documentation

## 2014-01-14 DIAGNOSIS — Z872 Personal history of diseases of the skin and subcutaneous tissue: Secondary | ICD-10-CM | POA: Diagnosis not present

## 2014-01-14 DIAGNOSIS — Z903 Acquired absence of stomach [part of]: Secondary | ICD-10-CM | POA: Diagnosis not present

## 2014-01-14 DIAGNOSIS — Z720 Tobacco use: Secondary | ICD-10-CM | POA: Insufficient documentation

## 2014-01-14 DIAGNOSIS — I10 Essential (primary) hypertension: Secondary | ICD-10-CM | POA: Diagnosis not present

## 2014-01-14 DIAGNOSIS — G8929 Other chronic pain: Secondary | ICD-10-CM | POA: Insufficient documentation

## 2014-01-14 DIAGNOSIS — Z79899 Other long term (current) drug therapy: Secondary | ICD-10-CM | POA: Diagnosis not present

## 2014-01-14 DIAGNOSIS — Z9884 Bariatric surgery status: Secondary | ICD-10-CM | POA: Diagnosis not present

## 2014-01-14 DIAGNOSIS — Z9049 Acquired absence of other specified parts of digestive tract: Secondary | ICD-10-CM | POA: Insufficient documentation

## 2014-01-14 DIAGNOSIS — E079 Disorder of thyroid, unspecified: Secondary | ICD-10-CM | POA: Diagnosis not present

## 2014-01-14 DIAGNOSIS — Z9089 Acquired absence of other organs: Secondary | ICD-10-CM | POA: Diagnosis not present

## 2014-01-14 DIAGNOSIS — Z9889 Other specified postprocedural states: Secondary | ICD-10-CM | POA: Insufficient documentation

## 2014-01-14 DIAGNOSIS — R1032 Left lower quadrant pain: Secondary | ICD-10-CM | POA: Insufficient documentation

## 2014-01-14 DIAGNOSIS — R109 Unspecified abdominal pain: Secondary | ICD-10-CM | POA: Diagnosis not present

## 2014-01-14 DIAGNOSIS — Z8739 Personal history of other diseases of the musculoskeletal system and connective tissue: Secondary | ICD-10-CM | POA: Insufficient documentation

## 2014-01-14 LAB — BASIC METABOLIC PANEL
Anion gap: 15 (ref 5–15)
BUN: 7 mg/dL (ref 6–23)
CALCIUM: 8.2 mg/dL — AB (ref 8.4–10.5)
CO2: 24 meq/L (ref 19–32)
Chloride: 105 mEq/L (ref 96–112)
Creatinine, Ser: 0.52 mg/dL (ref 0.50–1.10)
GFR calc Af Amer: >90 mL/min (ref >90)
GLUCOSE: 82 mg/dL (ref 70–99)
Potassium: 4.2 mEq/L (ref 3.7–5.3)
Sodium: 144 mEq/L (ref 137–147)

## 2014-01-14 LAB — CBC WITH DIFFERENTIAL/PLATELET
Basophils Absolute: 0 10*3/uL (ref 0.0–0.1)
Basophils Relative: 0 % (ref 0–1)
EOS ABS: 0 10*3/uL (ref 0.0–0.7)
EOS PCT: 0 % (ref 0–5)
HCT: 36.8 % (ref 36.0–46.0)
HEMOGLOBIN: 12.9 g/dL (ref 12.0–15.0)
LYMPHS ABS: 1.5 10*3/uL (ref 0.7–4.0)
LYMPHS PCT: 32 % (ref 12–46)
MCH: 30.2 pg (ref 26.0–34.0)
MCHC: 35.1 g/dL (ref 30.0–36.0)
MCV: 86.2 fL (ref 78.0–100.0)
MONOS PCT: 5 % (ref 3–12)
Monocytes Absolute: 0.2 10*3/uL (ref 0.1–1.0)
Neutro Abs: 3.1 10*3/uL (ref 1.7–7.7)
Neutrophils Relative %: 63 % (ref 43–77)
Platelets: 317 10*3/uL (ref 150–400)
RBC: 4.27 MIL/uL (ref 3.87–5.11)
RDW: 15.5 % (ref 11.5–15.5)
WBC: 4.9 10*3/uL (ref 4.0–10.5)

## 2014-01-14 LAB — HEPATIC FUNCTION PANEL
ALBUMIN: 3.4 g/dL — AB (ref 3.5–5.2)
ALT: 16 U/L (ref 0–35)
AST: 28 U/L (ref 0–37)
Alkaline Phosphatase: 87 U/L (ref 39–117)
Bilirubin, Direct: <0.2 mg/dL (ref 0.0–0.3)
Total Bilirubin: 0.3 mg/dL (ref 0.3–1.2)
Total Protein: 6.8 g/dL (ref 6.0–8.3)

## 2014-01-14 LAB — LIPASE, BLOOD: Lipase: 19 U/L (ref 11–59)

## 2014-01-14 MED ORDER — HYDROMORPHONE HCL 1 MG/ML IJ SOLN
1.0000 mg | Freq: Once | INTRAMUSCULAR | Status: AC
  Administered 2014-01-14: 1 mg via INTRAVENOUS
  Filled 2014-01-14: qty 1

## 2014-01-14 MED ORDER — OXYCODONE-ACETAMINOPHEN 5-325 MG PO TABS: 1.0000 | ORAL_TABLET | ORAL | Status: DC | PRN

## 2014-01-14 MED ORDER — ONDANSETRON HCL 4 MG/2ML IJ SOLN
4.0000 mg | Freq: Once | INTRAMUSCULAR | Status: DC
  Filled 2014-01-14: qty 2

## 2014-01-14 MED ORDER — IOHEXOL 300 MG/ML  SOLN
100.0000 mL | Freq: Once | INTRAMUSCULAR | Status: AC | PRN
  Administered 2014-01-14: 100 mL via INTRAVENOUS

## 2014-01-14 MED ORDER — ONDANSETRON HCL 4 MG/2ML IJ SOLN
4.0000 mg | Freq: Once | INTRAMUSCULAR | Status: AC
  Administered 2014-01-14: 4 mg via INTRAVENOUS

## 2014-01-14 MED ORDER — DIPHENHYDRAMINE HCL 50 MG/ML IJ SOLN
25.0000 mg | Freq: Once | INTRAMUSCULAR | Status: AC
  Administered 2014-01-14: 25 mg via INTRAVENOUS
  Filled 2014-01-14: qty 1

## 2014-01-14 MED ORDER — SODIUM CHLORIDE 0.9 % IV SOLN: Freq: Once | INTRAVENOUS | Status: DC

## 2014-01-14 MED ORDER — ONDANSETRON HCL 4 MG/2ML IJ SOLN
INTRAMUSCULAR | Status: AC
  Filled 2014-01-14: qty 2

## 2014-01-14 MED ORDER — SODIUM CHLORIDE 0.9 % IV SOLN
Freq: Once | INTRAVENOUS | Status: AC
  Administered 2014-01-14: 10:00:00 via INTRAVENOUS

## 2014-01-14 MED ORDER — MORPHINE SULFATE 4 MG/ML IJ SOLN
4.0000 mg | Freq: Once | INTRAMUSCULAR | Status: AC
  Administered 2014-01-14: 4 mg via INTRAVENOUS
  Filled 2014-01-14: qty 1

## 2014-01-14 MED ORDER — IOHEXOL 300 MG/ML  SOLN
50.0000 mL | Freq: Once | INTRAMUSCULAR | Status: AC | PRN
  Administered 2014-01-14: 50 mL via ORAL

## 2014-01-14 MED ORDER — PROMETHAZINE HCL 25 MG PO TABS: 25.0000 mg | ORAL_TABLET | Freq: Four times a day (QID) | ORAL | Status: DC | PRN

## 2014-01-14 NOTE — ED Provider Notes (Signed)
CSN: [id number]     Arrival date & time 01/14/14  0848 History  This chart was scribed for [name], MD by Steva Colder, ED Scribe. The patient was seen in room APA18/AP[facility]18 at 9:07 AM.     Chief Complaint  Patient presents with  . Abdominal Pain    The history is provided by the patient. No language interpreter was used.   HPI Comments: GERTRUDE Price is a 48 y.o. female who presents to the Emergency Department complaining of abdominal pain onset 3 days. She states that she is having associated symptoms of nausea and vomiting.  She denies any other symptoms.   She states that she had Bilroth procedure in 2000. Then 2005 and 2006 because of obstruction. She states that she had debridement of abdominal wall abscess in November. She states that she sees [facility] for the abdominal issues. She states that she has a spot on her stomach that since one year ago that is draining everyday. She states that she has had 7-8 abdomen surgery total. Pt states that she has a Mass on the left adrenal gland according to [facility] where it may require surgery. She had her right adrenal gland when she was 48 years old. She states that she is seeing a Psychiatric nurse and [place]. Her last CT was at [facility] on 10/3.   Past Medical History  Diagnosis Date  . Lupus   . 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 Hypertension   . Abscess     soft tissue  . Suicide attempt   . Blood transfusion without reported diagnosis   . Chronic abdominal pain   . Chronic wound infection of abdomen    Past Surgical History  Procedure Laterality Date  . Abdominal surgery    . Abdominal hysterectomy  2013    [place]  . Debridement of abdominal wall abscess N/A 02/08/2013    Procedure: DEBRIDEMENT OF ABDOMINAL WALL ABSCESS;  Surgeon: Jamesetta So, MD;  Location: [facility];  Service: General;  Laterality: N/A;  . Gastric bypass       [place], first 2000, 2005/2006.  . Colonoscopy      in [place]  . Cholecystectomy    .  Colonoscopy with propofol N/A 05/20/2013    Dr.Rourk- inadequate prep, normal appearing rectum, grossly normal colon aside from pancolonic diverticula, normal terminal ileum bx= unremarkable colonic mucosa  . Esophagogastroduodenoscopy (egd) with propofol N/A 05/20/2013    Dr.Rourk- s/p prior gastric surgery with normal esophagus, residual gastric mucosa and patent efferent limb  . Esophageal biopsy  05/20/2013    Procedure: BIOPSIES OF ASCENDING AND SIGMOID COLON;  Surgeon: Daneil Dolin, MD;  Location: [facility];  Service: Endoscopy;;  . Wound exploration Right 06/24/2013    Procedure: exploration of traumatic wound right wrist;  Surgeon: Tennis Must, MD;  Location: [place];  Service: Orthopedics;  Laterality: Right;  . Tendon repar    . Adrenal turmor removal     Family History  Problem Relation Age of Onset  . Brain cancer Son   . Schizophrenia Son   . Cancer Son     brain  . Breast cancer Maternal Aunt   . Bipolar disorder Maternal Aunt   . Drug abuse Maternal Aunt   . Colon cancer Maternal Grandmother     late 19s, early 20s  . Lung cancer Father   . Cancer Father     mets  . Liver disease Neg Hx   .  Drug abuse Mother   . Drug abuse Sister   . Drug abuse Brother   . Bipolar disorder Paternal Grandfather   . Bipolar disorder Cousin    History  Substance Use Topics  . Smoking status: Current Every Day Smoker -- 0.50 packs/day for 35 years    Types: Cigarettes  . Smokeless tobacco: Never Used  . Alcohol Use: Yes     Comment: daily   OB History   Grav Para Term Preterm Abortions TAB SAB Ect Mult Living                 Review of Systems  Gastrointestinal: Positive for nausea, vomiting and abdominal pain.  All other systems reviewed and are negative.   Allergies  Codeine and Morphine and related  Home Medications   Prior to Admission medications   Medication Sig Start Date End Date Taking? Authorizing Provider  ARIPiprazole (ABILIFY) 5 MG tablet Take 1 tablet (5 mg  total) by mouth 2 (two) times daily. 07/16/13  Yes Levonne Spiller, MD  escitalopram (LEXAPRO) 20 MG tablet Take 1 tablet (20 mg total) by mouth daily. 07/16/13 07/16/14 Yes Levonne Spiller, MD  lisinopril (PRINIVIL,ZESTRIL) 10 MG tablet Take 10 mg by mouth daily.   Yes Historical Provider, MD  mupirocin ointment (BACTROBAN) 2 % Place into the nose 2 (two) times daily. 12/13/13  Yes Maryann Mikhail, DO  oxyCODONE-acetaminophen (PERCOCET) 5-325 MG per tablet Take 1-2 tablets by mouth every 4 (four) hours as needed. 01/14/14   [name], MD  promethazine (PHENERGAN) 25 MG tablet Take 1 tablet (25 mg total) by mouth every 6 (six) hours as needed. 01/14/14   [name], MD   BP 163/116  Pulse 110  Temp(Src) 97.6 F (36.4 C) (Oral)  Resp 22  Ht 5\' 3"  (1.6 m)  Wt 130 lb (58.968 kg)  BMI 23.03 kg/m2  SpO2 100%  Physical Exam  Nursing note and vitals reviewed. Constitutional: She is oriented to person, place, and time. She appears well-developed and well-nourished.  HENT:  Head: Normocephalic and atraumatic.  Eyes: Conjunctivae and EOM are normal. Pupils are equal, round, and reactive to light.  Neck: Normal range of motion. Neck supple.  Cardiovascular: Normal rate, regular rhythm and normal heart sounds.   Pulmonary/Chest: Effort normal and breath sounds normal.  Abdominal: Soft. Bowel sounds are normal. There is tenderness (LLQ).  Vertical surgical scars on her abdomen. Draining wound on central inferior abdomen. (chronic) draining pus-like fluid.   Musculoskeletal: Normal range of motion.  Neurological: She is alert and oriented to person, place, and time.  Skin: Skin is warm and dry.  Psychiatric: She has a normal mood and affect. Her behavior is normal.    ED Course  Procedures (including critical care time) DIAGNOSTIC STUDIES: Oxygen Saturation is 100% on room air, normal by my interpretation.    COORDINATION OF CARE: 9:13 AM-Discussed treatment plan which includes IV fluids, CBC, CT  abdomen, Benadryl and Zofran with pt at bedside and pt agreed to plan.   Labs Review Labs Reviewed  BASIC METABOLIC PANEL - Abnormal; Notable for the following:    Calcium 8.2 (*)    All other components within normal limits  HEPATIC FUNCTION PANEL - Abnormal; Notable for the following:    Albumin 3.4 (*)    All other components within normal limits  CBC WITH DIFFERENTIAL  LIPASE, BLOOD    Imaging Review Ct Abdomen Pelvis W Contrast  01/14/2014   CLINICAL DATA:  Initial encounter for 3  day history of abdominal pain. Status post Billroth surgery.  EXAM: CT ABDOMEN AND PELVIS WITH CONTRAST  TECHNIQUE: Multidetector CT imaging of the abdomen and pelvis was performed using the standard protocol following bolus administration of intravenous contrast.  CONTRAST:  60mL OMNIPAQUE IOHEXOL 300 MG/ML SOLN, 147mL OMNIPAQUE IOHEXOL 300 MG/ML SOLN  COMPARISON:  12/08/2013.  FINDINGS: Lower chest:  Minimal emphysema in the posterior lung bases.  Hepatobiliary: Insert normal liver gallbladder is surgically absent. Prominence of the intrahepatic biliary ducts is not substantially changed in the interval nor is the distension of the common duct.  Pancreas: No focal mass lesion. No peripancreatic edema. No dilatation of the main pancreatic duct.  Spleen: No splenomegaly. No focal mass lesion.  Adrenals/Urinary Tract: Patient is status post resection of the right adrenal gland. 20 mm left adrenal nodule is stable. Washout characteristics are compatible with benign adrenal adenoma. Kidneys are normal bilaterally.  Stomach/Bowel: The patient is status post distal gastrectomy with gastroenteric anastomosis patent. No evidence for small bowel obstruction. Areas of small bowel distention are seen associated with anastomotic sites suggesting atony. Terminal ileum is normal. There is mild wall thickening in the right colon with associated fatty change, findings which may be related to underlying chronic inflammation. No  colonic diverticulitis.  Vascular/Lymphatic: Atherosclerotic calcification is noted in the wall of the abdominal aorta without aneurysm. No gastrohepatic or hepatoduodenal ligament lymphadenopathy. No retroperitoneal lymphadenopathy. No evidence for pelvic sidewall lymphadenopathy.  Reproductive: Uterus is surgically absent.  No adnexal mass.  Other: The abnormal soft tissue attenuation seen in the anterior abdominal wall, the level of the umbilicus in tracking leftward into the left paraumbilical abdominal wall is unchanged in the interval.  Musculoskeletal: Bone windows reveal no worrisome lytic or sclerotic osseous lesions.  IMPRESSION: No substantial interval change. Previously described abnormal soft tissue attenuation in the anterior abdominal wall, at the level of the umbilicus and to the left, is not substantially changed. As before this appears to have subtle central low-attenuation raising the question for chronic infection/abscess.  Status post distal gastrectomy and right adrenalectomy. No evidence for bowel obstruction. No acute intraperitoneal abnormality.  Fatty changes in the wall of the colon suggests a history of chronic inflammation. No evidence for colonic wall edema or pericolonic edema/ inflammation to suggest active inflammatory process at this time.   Electronically Signed   By: Misty Stanley M.D.   On: 01/14/2014 11:20     EKG Interpretation None      MDM   Final diagnoses:  Abdominal pain    No acute abdomen. CT abdomen and pelvis showed no substantial interval change. White count is normal. Pain management and hydration.  Discharge medications Percocet and Phenergan 25 mg  I personally performed the services described in this documentation, which was scribed in my presence. The recorded information has been reviewed and is accurate.    [name], MD 01/14/14 [phone]

## 2014-01-14 NOTE — ED Notes (Signed)
Pt complain of rash to right forearm. States she is allergic to morphine. EDP aware

## 2014-01-14 NOTE — ED Notes (Signed)
Patient with no complaints at this time. Respirations even and unlabored. Skin warm/dry. Discharge instructions reviewed with patient at this time. Patient given opportunity to voice concerns/ask questions. IV removed per policy and band-aid applied to site. Patient discharged at this time and left Emergency Department with steady gait.  

## 2014-01-14 NOTE — ED Notes (Signed)
Pt reports abdominal pain,n/v x3 days. Pt actively vomiting in triage. Pt reports history of recent abdominal surgery.

## 2014-01-14 NOTE — Discharge Instructions (Signed)
CT scan of your abdomen and pelvis showed no acute findings. Medication for pain and nausea. Recommend follow-up at [facility] if symptoms worsen

## 2014-01-14 NOTE — ED Notes (Signed)
Pt drinking contrast for CT. 

## 2014-01-16 DIAGNOSIS — K3184 Gastroparesis: Secondary | ICD-10-CM

## 2014-01-16 HISTORY — DX: Gastroparesis: K31.84

## 2014-01-21 ENCOUNTER — Emergency Department (HOSPITAL_COMMUNITY)
Admission: EM | Admit: 2014-01-21 | Discharge: 2014-01-21 | Disposition: A | Discharge status: Home / Self Care | Payer: Medicare Other | Attending: Emergency Medicine | Admitting: Emergency Medicine

## 2014-01-21 ENCOUNTER — Emergency Department (HOSPITAL_COMMUNITY): Payer: Medicare Other

## 2014-01-21 ENCOUNTER — Encounter: Payer: Self-pay | Admitting: Gastroenterology

## 2014-01-21 ENCOUNTER — Encounter (HOSPITAL_COMMUNITY): Payer: Self-pay | Admitting: *Deleted

## 2014-01-21 ENCOUNTER — Ambulatory Visit (INDEPENDENT_AMBULATORY_CARE_PROVIDER_SITE_OTHER): Payer: Medicare Other | Admitting: Gastroenterology

## 2014-01-21 ENCOUNTER — Other Ambulatory Visit: Payer: Self-pay

## 2014-01-21 VITALS — BP 130/94 | HR 100 | Temp 97.7°F | Ht 60.0 in | Wt 126.0 lb

## 2014-01-21 DIAGNOSIS — G8929 Other chronic pain: Secondary | ICD-10-CM | POA: Insufficient documentation

## 2014-01-21 DIAGNOSIS — Z79899 Other long term (current) drug therapy: Secondary | ICD-10-CM | POA: Diagnosis not present

## 2014-01-21 DIAGNOSIS — Z9889 Other specified postprocedural states: Secondary | ICD-10-CM | POA: Diagnosis not present

## 2014-01-21 DIAGNOSIS — Z8739 Personal history of other diseases of the musculoskeletal system and connective tissue: Secondary | ICD-10-CM | POA: Diagnosis not present

## 2014-01-21 DIAGNOSIS — K920 Hematemesis: Secondary | ICD-10-CM | POA: Diagnosis not present

## 2014-01-21 DIAGNOSIS — I10 Essential (primary) hypertension: Secondary | ICD-10-CM | POA: Insufficient documentation

## 2014-01-21 DIAGNOSIS — R945 Abnormal results of liver function studies: Secondary | ICD-10-CM

## 2014-01-21 DIAGNOSIS — R109 Unspecified abdominal pain: Secondary | ICD-10-CM | POA: Insufficient documentation

## 2014-01-21 DIAGNOSIS — Z720 Tobacco use: Secondary | ICD-10-CM | POA: Diagnosis not present

## 2014-01-21 DIAGNOSIS — R1032 Left lower quadrant pain: Secondary | ICD-10-CM | POA: Diagnosis present

## 2014-01-21 DIAGNOSIS — R1084 Generalized abdominal pain: Secondary | ICD-10-CM

## 2014-01-21 DIAGNOSIS — R7989 Other specified abnormal findings of blood chemistry: Secondary | ICD-10-CM

## 2014-01-21 DIAGNOSIS — Z3202 Encounter for pregnancy test, result negative: Secondary | ICD-10-CM | POA: Diagnosis not present

## 2014-01-21 DIAGNOSIS — Z8639 Personal history of other endocrine, nutritional and metabolic disease: Secondary | ICD-10-CM | POA: Diagnosis not present

## 2014-01-21 DIAGNOSIS — S31105A Unspecified open wound of abdominal wall, periumbilic region without penetration into peritoneal cavity, initial encounter: Secondary | ICD-10-CM | POA: Diagnosis not present

## 2014-01-21 DIAGNOSIS — Z90710 Acquired absence of both cervix and uterus: Secondary | ICD-10-CM | POA: Insufficient documentation

## 2014-01-21 DIAGNOSIS — R110 Nausea: Secondary | ICD-10-CM | POA: Diagnosis not present

## 2014-01-21 DIAGNOSIS — Z9089 Acquired absence of other organs: Secondary | ICD-10-CM | POA: Diagnosis not present

## 2014-01-21 DIAGNOSIS — Z872 Personal history of diseases of the skin and subcutaneous tissue: Secondary | ICD-10-CM | POA: Diagnosis not present

## 2014-01-21 DIAGNOSIS — R112 Nausea with vomiting, unspecified: Secondary | ICD-10-CM | POA: Insufficient documentation

## 2014-01-21 DIAGNOSIS — K760 Fatty (change of) liver, not elsewhere classified: Secondary | ICD-10-CM | POA: Diagnosis not present

## 2014-01-21 HISTORY — DX: Other specified disorders of adrenal gland: E27.8

## 2014-01-21 LAB — COMPREHENSIVE METABOLIC PANEL
ALT: 16 U/L (ref 0–35)
AST: 40 U/L — ABNORMAL HIGH (ref 0–37)
Albumin: 4 g/dL (ref 3.5–5.2)
Alkaline Phosphatase: 86 U/L (ref 39–117)
Anion gap: 16 — ABNORMAL HIGH (ref 5–15)
BUN: 9 mg/dL (ref 6–23)
CALCIUM: 9 mg/dL (ref 8.4–10.5)
CO2: 25 meq/L (ref 19–32)
CREATININE: 0.51 mg/dL (ref 0.50–1.10)
Chloride: 97 mEq/L (ref 96–112)
GLUCOSE: 80 mg/dL (ref 70–99)
Potassium: 3.9 mEq/L (ref 3.7–5.3)
Sodium: 138 mEq/L (ref 137–147)
Total Bilirubin: 0.5 mg/dL (ref 0.3–1.2)
Total Protein: 8.1 g/dL (ref 6.0–8.3)

## 2014-01-21 LAB — URINALYSIS, ROUTINE W REFLEX MICROSCOPIC
Bilirubin Urine: NEGATIVE
Glucose, UA: NEGATIVE mg/dL
Hgb urine dipstick: NEGATIVE
LEUKOCYTES UA: NEGATIVE
NITRITE: NEGATIVE
PH: 5 (ref 5.0–8.0)
Protein, ur: NEGATIVE mg/dL
Specific Gravity, Urine: >1.03 — ABNORMAL HIGH (ref 1.005–1.030)
UROBILINOGEN UA: 0.2 mg/dL (ref 0.0–1.0)

## 2014-01-21 LAB — LIPASE, BLOOD: LIPASE: 15 U/L (ref 11–59)

## 2014-01-21 LAB — CBC WITH DIFFERENTIAL/PLATELET
BASOS ABS: 0 10*3/uL (ref 0.0–0.1)
Basophils Relative: 0 % (ref 0–1)
EOS ABS: 0 10*3/uL (ref 0.0–0.7)
Eosinophils Relative: 1 % (ref 0–5)
HCT: 37 % (ref 36.0–46.0)
Hemoglobin: 13.2 g/dL (ref 12.0–15.0)
Lymphocytes Relative: 42 % (ref 12–46)
Lymphs Abs: 1.6 10*3/uL (ref 0.7–4.0)
MCH: 30.8 pg (ref 26.0–34.0)
MCHC: 35.7 g/dL (ref 30.0–36.0)
MCV: 86.4 fL (ref 78.0–100.0)
Monocytes Absolute: 0.5 10*3/uL (ref 0.1–1.0)
Monocytes Relative: 13 % — ABNORMAL HIGH (ref 3–12)
NEUTROS PCT: 44 % (ref 43–77)
Neutro Abs: 1.7 10*3/uL (ref 1.7–7.7)
PLATELETS: 332 10*3/uL (ref 150–400)
RBC: 4.28 MIL/uL (ref 3.87–5.11)
RDW: 16.1 % — ABNORMAL HIGH (ref 11.5–15.5)
WBC: 3.9 10*3/uL — AB (ref 4.0–10.5)

## 2014-01-21 LAB — PREGNANCY, URINE: Preg Test, Ur: NEGATIVE

## 2014-01-21 MED ORDER — HYDROMORPHONE HCL 1 MG/ML IJ SOLN
1.0000 mg | Freq: Once | INTRAMUSCULAR | Status: AC
  Administered 2014-01-21: 1 mg via INTRAVENOUS
  Filled 2014-01-21: qty 1

## 2014-01-21 MED ORDER — PROMETHAZINE HCL 25 MG/ML IJ SOLN
25.0000 mg | Freq: Once | INTRAMUSCULAR | Status: AC
  Administered 2014-01-21: 25 mg via INTRAVENOUS
  Filled 2014-01-21: qty 1

## 2014-01-21 MED ORDER — ONDANSETRON HCL 4 MG PO TABS: 4.0000 mg | ORAL_TABLET | Freq: Three times a day (TID) | ORAL | Status: DC

## 2014-01-21 MED ORDER — LABETALOL HCL 5 MG/ML IV SOLN
20.0000 mg | Freq: Once | INTRAVENOUS | Status: AC
  Administered 2014-01-21: 20 mg via INTRAVENOUS
  Filled 2014-01-21: qty 4

## 2014-01-21 MED ORDER — PANTOPRAZOLE SODIUM 40 MG PO TBEC: 40.0000 mg | DELAYED_RELEASE_TABLET | Freq: Two times a day (BID) | ORAL | Status: DC

## 2014-01-21 MED ORDER — SUCRALFATE 1 GM/10ML PO SUSP: 1.0000 g | Freq: Four times a day (QID) | ORAL | Status: DC

## 2014-01-21 MED ORDER — ONDANSETRON HCL 4 MG/2ML IJ SOLN
4.0000 mg | Freq: Once | INTRAMUSCULAR | Status: AC
  Administered 2014-01-21: 4 mg via INTRAVENOUS
  Filled 2014-01-21: qty 2

## 2014-01-21 MED ORDER — IOHEXOL 300 MG/ML  SOLN
100.0000 mL | Freq: Once | INTRAMUSCULAR | Status: AC | PRN
  Administered 2014-01-21: 100 mL via INTRAVENOUS

## 2014-01-21 MED ORDER — SODIUM CHLORIDE 0.9 % IV BOLUS (SEPSIS)
1000.0000 mL | Freq: Once | INTRAVENOUS | Status: AC
Start: 2014-01-21 — End: 2014-01-21
  Administered 2014-01-21: 1000 mL via INTRAVENOUS

## 2014-01-21 MED ORDER — LISINOPRIL 10 MG PO TABS
10.0000 mg | ORAL_TABLET | Freq: Once | ORAL | Status: AC
  Administered 2014-01-21: 10 mg via ORAL
  Filled 2014-01-21: qty 1

## 2014-01-21 MED ORDER — IOHEXOL 300 MG/ML  SOLN
50.0000 mL | Freq: Once | INTRAMUSCULAR | Status: AC | PRN
  Administered 2014-01-21: 50 mL via ORAL

## 2014-01-21 MED ORDER — PANTOPRAZOLE SODIUM 40 MG IV SOLR
40.0000 mg | Freq: Once | INTRAVENOUS | Status: AC
  Administered 2014-01-21: 40 mg via INTRAVENOUS
  Filled 2014-01-21: qty 40

## 2014-01-21 NOTE — Assessment & Plan Note (Signed)
48 year old female with history of Billroth II in remote past and multiple abdominal surgeries, multiple comorbidities, with acute on chronic N/V and now with reports of hematemesis unable to quantify. No evidence of melena or anemia on recent CBC. Continues to drink ETOH and admits to using aspirin powders intermittently. Currently no PPI. Last EGD Feb 2015 with normal esophagus and patent efferent small bowel limb. With her persistent issues, dyspepsia, and weight loss, recommend repeat EGD to assess for anastomotic stricture, ulceration, esophagitis. Hematemesis likely secondary to repeated vomiting. I have instructed her to seek medical attention if large volume bloody emesis, melena, or other concerning features.   As of note, I reviewed CT from Oct 30 with Dr. Ardeen Garland, radiologist. NO EVIDENCE for internal hernia. Question of chronic post-operative scarring/thickening of small bowel. Mesenteric vasculature patent. I discussed with patient that ETOH would only exacerbate symptoms, as well as aspirin powders.   Start PPI (Protonix BID) Carafate QID Zofran for nausea scheduled Soft diet, hydration Proceed with upper endoscopy in the near future with Dr. Gala Romney. The risks, benefits, and alternatives have been discussed in detail with patient. They have stated understanding and desire to proceed.  PROPOFOL due to ETOH abuse and history of cocaine use

## 2014-01-21 NOTE — ED Provider Notes (Signed)
CSN: [id number]     Arrival date & time 01/21/14  1036 History   First MD Initiated Contact with Patient 01/21/14 1457    This chart was scribed for No att. providers found by Terressa Koyanagi, ED Scribe. This patient was seen in room APA11/APA11 and the patient's care was started at 3:07 PM.  Chief Complaint  Patient presents with  . Nausea  . Hematemesis   HPI PCP: Doran Heater, MD HPI Comments: Angel Price is a 48 y.o. female who presents to the Emergency Department complaining of hematemesis onset yesterday--pt specifies that she had two episodes of hematemesis. Pt reports that she has chronic n/v, specifying she has one to two episodes of emesis everyday for the past 6 months. Pt also reports weight loss over the past few months. As a secondary matter, pt complains of pain in her left, lower abd. Pt notes that while she has chronic abd pain, this particular pain is new. Pt reports her last endoscopy was 7 months ago. Pt denies chest pain, SOB, chronic blood thinners.  Past Medical History  Diagnosis Date  . Lupus   . 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 Hypertension   . Abscess     soft tissue  . Suicide attempt   . Blood transfusion without reported diagnosis   . Chronic abdominal pain   . Chronic wound infection of abdomen   . Adrenal mass    Past Surgical History  Procedure Laterality Date  . Abdominal surgery    . Abdominal hysterectomy  2013    [place]  . Debridement of abdominal wall abscess N/A 02/08/2013    Procedure: DEBRIDEMENT OF ABDOMINAL WALL ABSCESS;  Surgeon: Jamesetta So, MD;  Location: [facility];  Service: General;  Laterality: N/A;  . Gastric bypass       [place], first 2000, 2005/2006.  . Colonoscopy      in [place]  . Cholecystectomy    . Colonoscopy with propofol N/A 05/20/2013    Dr.Rourk- inadequate prep, normal appearing rectum, grossly normal colon aside from pancolonic diverticula, normal terminal ileum bx= unremarkable colonic  mucosa  . Esophagogastroduodenoscopy (egd) with propofol N/A 05/20/2013    Dr.Rourk- s/p prior gastric surgery with normal esophagus, residual gastric mucosa and patent efferent limb  . Esophageal biopsy  05/20/2013    Procedure: BIOPSIES OF ASCENDING AND SIGMOID COLON;  Surgeon: Daneil Dolin, MD;  Location: [facility];  Service: Endoscopy;;  . Wound exploration Right 06/24/2013    Procedure: exploration of traumatic wound right wrist;  Surgeon: Tennis Must, MD;  Location: [place];  Service: Orthopedics;  Laterality: Right;  . Tendon repar    . Adrenal turmor removal     Family History  Problem Relation Age of Onset  . Brain cancer Son   . Schizophrenia Son   . Cancer Son     brain  . Breast cancer Maternal Aunt   . Bipolar disorder Maternal Aunt   . Drug abuse Maternal Aunt   . Colon cancer Maternal Grandmother     late 58s, early 15s  . Lung cancer Father   . Cancer Father     mets  . Liver disease Neg Hx   . Drug abuse Mother   . Drug abuse Sister   . Drug abuse Brother   . Bipolar disorder Paternal Grandfather   . Bipolar disorder Cousin    History  Substance Use Topics  . Smoking status: Current Every Day  Smoker -- 0.50 packs/day for 35 years    Types: Cigarettes  . Smokeless tobacco: Never Used  . Alcohol Use: 0.0 oz/week    0 Not specified per week     Comment: as of 11/6: daily but "calmed down", not like I used to. 6 pack of beer daily.    OB History    No data available     Review of Systems  Constitutional: Negative for fever and chills.  Respiratory: Negative for shortness of breath.   Cardiovascular: Negative for chest pain.  Gastrointestinal: Positive for nausea, vomiting and abdominal pain.       Positive for hematemesis  Hematological: Does not bruise/bleed easily.  Psychiatric/Behavioral: Negative for confusion.  All other systems reviewed and are negative.     Allergies  Codeine and Morphine and related  Home Medications   Prior to Admission  medications   Medication Sig Start Date End Date Taking? Authorizing Provider  ARIPiprazole (ABILIFY) 5 MG tablet Take 1 tablet (5 mg total) by mouth 2 (two) times daily. 07/16/13  Yes Levonne Spiller, MD  escitalopram (LEXAPRO) 20 MG tablet Take 1 tablet (20 mg total) by mouth daily. 07/16/13 07/16/14 Yes Levonne Spiller, MD  lisinopril (PRINIVIL,ZESTRIL) 10 MG tablet Take 10 mg by mouth daily.   Yes Historical Provider, MD  mupirocin ointment (BACTROBAN) 2 % Place into the nose 2 (two) times daily. 12/13/13  Yes Maryann Mikhail, DO  ondansetron (ZOFRAN) 4 MG tablet Take 1 tablet (4 mg total) by mouth 3 (three) times daily. With meals 01/21/14  Yes Orvil Feil, NP  pantoprazole (PROTONIX) 40 MG tablet Take 1 tablet (40 mg total) by mouth 2 (two) times daily before a meal. 01/21/14  Yes Orvil Feil, NP  promethazine (PHENERGAN) 25 MG tablet Take 1 tablet (25 mg total) by mouth every 6 (six) hours as needed. 01/14/14  Yes [name], MD  sucralfate (CARAFATE) 1 GM/10ML suspension Take 10 mLs (1 g total) by mouth 4 (four) times daily. 01/21/14  Yes Orvil Feil, NP  oxyCODONE-acetaminophen (PERCOCET) 5-325 MG per tablet Take 1-2 tablets by mouth every 4 (four) hours as needed. Patient not taking: Reported on 01/21/2014 01/14/14   [name], MD   Triage Vitals: BP 133/103 mmHg  Pulse 98  Temp(Src) 98.7 F (37.1 C) (Oral)  Resp 16  Ht 5\' 3"  (1.6 m)  Wt 126 lb (57.153 kg)  BMI 22.33 kg/m2  SpO2 100% Physical Exam  Constitutional: She is oriented to person, place, and time. She appears well-developed and well-nourished. No distress.  HENT:  Head: Normocephalic and atraumatic.  Mouth/Throat: Oropharynx is clear and moist. No oropharyngeal exudate.  Eyes: Conjunctivae and EOM are normal. Pupils are equal, round, and reactive to light.  Neck: Normal range of motion. Neck supple.  No meningismus.  Cardiovascular: Normal rate, regular rhythm, normal heart sounds and intact distal pulses.   No murmur  heard. Pulmonary/Chest: Effort normal and breath sounds normal. No respiratory distress.  Abdominal: Soft. She exhibits no distension and no mass. There is no tenderness. There is no rebound and no guarding.  Multiple surgical scars, draining wound in inferior abd, tender induration in LLQ.   Genitourinary:  Chaperone was present. There are no external fissures noted. No induration of the skin or swelling. No external hemorrhoids seen. Patient able to tolerate examination.There is no gross blood. NO signs of perirectal abscess.    Musculoskeletal: Normal range of motion. She exhibits no edema or tenderness.  Neurological: She  is alert and oriented to person, place, and time. No cranial nerve deficit. She exhibits normal muscle tone. Coordination normal.  No ataxia on finger to nose bilaterally. No pronator drift. 5/5 strength throughout. CN 2-12 intact. Negative Romberg. Equal grip strength. Sensation intact. Gait is normal.   Skin: Skin is warm.  Psychiatric: She has a normal mood and affect. Her behavior is normal.  Nursing note and vitals reviewed.   ED Course  Procedures (including critical care time) DIAGNOSTIC STUDIES: Oxygen Saturation is 100% on RA, nl by my interpretation.    COORDINATION OF CARE: 3:11 PM-Discussed treatment plan which includes meds and labs with pt at bedside and pt agreed to plan.   Labs Review Labs Reviewed  COMPREHENSIVE METABOLIC PANEL - Abnormal; Notable for the following:    AST 40 (*)    Anion gap 16 (*)    All other components within normal limits  CBC WITH DIFFERENTIAL - Abnormal; Notable for the following:    WBC 3.9 (*)    RDW 16.1 (*)    Monocytes Relative 13 (*)    All other components within normal limits  URINALYSIS, ROUTINE W REFLEX MICROSCOPIC - Abnormal; Notable for the following:    Specific Gravity, Urine >1.030 (*)    Ketones, ur TRACE (*)    All other components within normal limits  LIPASE, BLOOD  PREGNANCY, URINE  POC OCCULT  BLOOD, ED    Imaging Review Ct Abdomen Pelvis W Contrast  01/21/2014   CLINICAL DATA:  Hematemesis for 1 day. Chronic nausea and vomiting for the last 6 months. Unexplained weight loss over the last 3 months. Left lower quadrant abdominal pain. History of previous abdominal surgery including a cholecystectomy and hysterectomy. History of an adrenal tumor removal.  EXAM: CT ABDOMEN AND PELVIS WITH CONTRAST  TECHNIQUE: Multidetector CT imaging of the abdomen and pelvis was performed using the standard protocol following bolus administration of intravenous contrast.  CONTRAST:  42mL OMNIPAQUE IOHEXOL 300 MG/ML SOLN, 173mL OMNIPAQUE IOHEXOL 300 MG/ML SOLN  COMPARISON:  01/14/2014.  FINDINGS: 6 mm nodule in the right middle lobe. This was not included on the field of view from any of the prior abdomen and pelvis CTs. Lung bases otherwise clear. Heart is normal in size.  Mild diffuse fatty infiltration of the liver. No liver mass or focal lesion.  Gallbladder surgically absent. Chronic dilation of the intra and extrahepatic biliary tree, stable.  Normal spleen.  2 cm left adrenal mass, low-density, most consistent with an adenoma. This is stable. Status post right adrenal resection  Normal kidneys, ureters and bladder.  Uterus is surgically absent.  No pelvic masses.  No adenopathy.  No abnormal fluid collections.  Colon and small bowel are unremarkable. No appendix visualized, it may be surgically absent. Surgical clips lie in the epigastric region, stable.  There is abnormal soft tissue along the anterior abdominal wall of the mid to lower abdomen just below the umbilicus. This is stable from the prior study consistent postsurgical scarring. It is stable dating back to February 2015.  No significant bony abnormality.  IMPRESSION: 1. No acute findings. No findings to explain left lower quadrant pain or unexplained weight loss. No findings to explain hematemesis. 2. Mild hepatic steatosis. 3. Abnormal soft tissue in  the low anterior abdominal wall consistent with chronic postsurgical change/ scarring. This has been stable since February 2015. 4. Status post cholecystectomy and hysterectomy and right adrenal resection as well as surgery at the gastroesophageal junction. These findings are  all stable.   Electronically Signed   By: Lajean Manes M.D.   On: 01/21/2014 19:56   Dg Abd Acute W/chest  01/21/2014   CLINICAL DATA:  48 year old female with small draining wound near the umbilicus. Left of midline abdominal pain. Initial encounter. Multiple prior abdominal surgeries, most recently 2014.  EXAM: ACUTE ABDOMEN SERIES (ABDOMEN 2 VIEW & CHEST 1 VIEW)  COMPARISON:  CT Abdomen and Pelvis 01/14/2014 and earlier.  FINDINGS: Lung volumes are stable and within normal limits. Normal cardiac size and mediastinal contours. Visualized tracheal air column is within normal limits. No pneumothorax or pneumoperitoneum.  Stable extensive right upper quadrant surgical clips. Multiple staple lines re- identified in both the left and right abdomen. Non obstructed bowel gas pattern. Abdominal and pelvic visceral contours are stable. No osseous abnormality identified. Right flank calcified injection granuloma.  IMPRESSION: 1.  Non obstructed bowel gas pattern, no free air. 2. Stable abdominal postoperative changes. 3.  No acute cardiopulmonary abnormality.   Electronically Signed   By: Lars Pinks M.D.   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  Patient saw GI earlier today. She was scheduled for EGD on November 19. Last EGD in February was unremarkable. She continues to drink alcohol.  Hemoglobin stable. CT from October 30 reviewed. Evidence of chronic  abdominal wall infection that is unchanged. Labs today at baseline.  UA negative.  CT without acute finding. No drainable abscess. Patient remains hypertensive. Heart rate has improved to the 80s. She is given her home blood pressure meds. She is tolerating PO and pain is controlled.  She appears stable for outpatient followup. NO evidence of active GI bleed.  She has PPI and carafate that were prescribed earlier. Advised to refrain from alcohol, NSAIDs, spicy foods, caffeine.  BP 122/91 mmHg  Pulse 90  Temp(Src) 98.7 F (37.1 C) (Oral)  Resp 16  Ht 5\' 3"  (1.6 m)  Wt 126 lb (57.153 kg)  BMI 22.33 kg/m2  SpO2 99%   I personally performed the services described in this documentation, which was scribed in my presence.  The recorded information has been reviewed and considered.   Ezequiel Essex, MD 01/22/14 [phone]

## 2014-01-21 NOTE — Assessment & Plan Note (Signed)
Biliary dilatation on CT in past, with f/u MRCP noted. Likely secondary to post-cholecystectomy state. LFTs are stable. Question abnormality secondary to chronic ETOH use. Monitor closely.

## 2014-01-21 NOTE — ED Notes (Signed)
Hx of persistent n/v, but yesterday noticed burgundy blood in vomitus.  Vomiting has continued today.  Reports weight loss.  Has "knot" on L abdomen at umbilicus line.  She is being evaluated for mass on adrenal gland.

## 2014-01-21 NOTE — Progress Notes (Signed)
Primary Care Physician:  Doran Heater, MD  Primary GI: Dr. Gala Romney   Chief Complaint  Patient presents with  . Follow-up    Rectal Bleeding    HPI:   Angel Price is a 48 year old female presenting today with history of abdominal pain, abnormal LFTs, diarrhea, N/V. Colonoscopy/EGD in March 2015 with normal esophagus, residual gastric mucosa and patent efferent limb, inadequate colon prep for polyp detection. Normal TI. Needs early interval colonoscopy in March 2016.  MRCP/MRI with common hepatic duct, cystic duct remnant, and CBD all mildly dilated measuring up to 35mm, which is likely related to post-cholecystectomy state. The CBD appeared to abruptly taper immediately before level of ampulla, unable to exlude distal CBD stricture. 3 additional CTs with contrast since that time with persistent prominence of extrahepatic bile ducts, favored to be post-cholecystectomy related. 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 Multiple prior bowel operations and abdominal wall abscesses.    Notes pain to left of umbilicus and incision site "leaking". Followed at [facility]. Seeing a Education officer, environmental, plastic surgeon, and oncologist with history of adrenal adenoma. Pain is constant. N/V every day. States "blood in vomit". Unable to quantify amount. No melena. Hematemesis yesterday. Notes epigastric discomfort with eating. Usually vomits with eating. States throwing up undigested food. States she was 136 about 2 weeks ago, now 126 today. Not on a PPI. Phenergan helps sometimes. No NSAIDs. Occasional GOODY POWDERS. Severe reflux. CONTINUES TO DRINK ALCOHOL.   Feb 2015 141 June 2015 129 Currently: 126   Past Medical History  Diagnosis Date  . Lupus   . 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 Hypertension   . Abscess     soft tissue  . Suicide attempt   . Blood transfusion without reported diagnosis   . Chronic abdominal pain   . Chronic wound infection of abdomen    . Adrenal mass     Past Surgical History  Procedure Laterality Date  . Abdominal surgery    . Abdominal hysterectomy  2013    [place]  . Debridement of abdominal wall abscess N/A 02/08/2013    Procedure: DEBRIDEMENT OF ABDOMINAL WALL ABSCESS;  Surgeon: Jamesetta So, MD;  Location: [facility];  Service: General;  Laterality: N/A;  . Gastric bypass       [place], first 2000, 2005/2006.  . Colonoscopy      in [place]  . Cholecystectomy    . Colonoscopy with propofol N/A 05/20/2013    Dr.Rourk- inadequate prep, normal appearing rectum, grossly normal colon aside from pancolonic diverticula, normal terminal ileum bx= unremarkable colonic mucosa  . Esophagogastroduodenoscopy (egd) with propofol N/A 05/20/2013    Dr.Rourk- s/p prior gastric surgery with normal esophagus, residual gastric mucosa and patent efferent limb  . Esophageal biopsy  05/20/2013    Procedure: BIOPSIES OF ASCENDING AND SIGMOID COLON;  Surgeon: Daneil Dolin, MD;  Location: [facility];  Service: Endoscopy;;  . Wound exploration Right 06/24/2013    Procedure: exploration of traumatic wound right wrist;  Surgeon: Tennis Must, MD;  Location: [place];  Service: Orthopedics;  Laterality: Right;  . Tendon repar    . Adrenal turmor removal      Current Outpatient Prescriptions  Medication Sig Dispense Refill  . ARIPiprazole (ABILIFY) 5 MG tablet Take 1 tablet (5 mg total) by mouth 2 (two) times daily. 60 tablet 2  . escitalopram (LEXAPRO) 20 MG tablet Take 1 tablet (20 mg total) by mouth  daily. 30 tablet 2  . lisinopril (PRINIVIL,ZESTRIL) 10 MG tablet Take 10 mg by mouth daily.    . mupirocin ointment (BACTROBAN) 2 % Place into the nose 2 (two) times daily. 22 g 0  . oxyCODONE-acetaminophen (PERCOCET) 5-325 MG per tablet Take 1-2 tablets by mouth every 4 (four) hours as needed. 25 tablet 0  . promethazine (PHENERGAN) 25 MG tablet Take 1 tablet (25 mg total) by mouth every 6 (six) hours as needed. 20 tablet 0  . ondansetron (ZOFRAN)  4 MG tablet Take 1 tablet (4 mg total) by mouth 3 (three) times daily. With meals 90 tablet 1  . pantoprazole (PROTONIX) 40 MG tablet Take 1 tablet (40 mg total) by mouth 2 (two) times daily before a meal. 60 tablet 3  . sucralfate (CARAFATE) 1 GM/10ML suspension Take 10 mLs (1 g total) by mouth 4 (four) times daily. 420 mL 1   No current facility-administered medications for this visit.    Allergies as of 01/21/2014 - Review Complete 01/21/2014  Allergen Reaction Noted  . Codeine Hives 02/06/2013  . Morphine and related Hives 02/06/2013    Family History  Problem Relation Age of Onset  . Brain cancer Son   . Schizophrenia Son   . Cancer Son     brain  . Breast cancer Maternal Aunt   . Bipolar disorder Maternal Aunt   . Drug abuse Maternal Aunt   . Colon cancer Maternal Grandmother     late 30s, early 43s  . Lung cancer Father   . Cancer Father     mets  . Liver disease Neg Hx   . Drug abuse Mother   . Drug abuse Sister   . Drug abuse Brother   . Bipolar disorder Paternal Grandfather   . Bipolar disorder Cousin     History   Social History  . Marital Status: Married    Spouse Name: N/A    Number of Children: N/A  . Years of Education: N/A   Social History Main Topics  . Smoking status: Current Every Day Smoker -- 0.50 packs/day for 35 years    Types: Cigarettes  . Smokeless tobacco: Never Used  . Alcohol Use: 0.0 oz/week    0 Not specified per week     Comment: as of 11/6: daily but "calmed down", not like I used to. 6 pack of beer daily.   . Drug Use: No     Comment: quit 09/2012  . Sexual Activity: Yes    Birth Control/ Protection: Surgical   Other Topics Concern  . Not on file   Social History Narrative    Review of Systems: As mentioned in HPI  Physical Exam: BP 130/94 mmHg  Pulse 100  Temp(Src) 97.7 F (36.5 C) (Oral)  Ht 5' (1.524 m)  Wt 126 lb (57.153 kg)  BMI 24.61 kg/m2 General:   Alert and oriented. No distress noted. Pleasant and  cooperative.  Head:  Normocephalic and atraumatic. Eyes:  Conjuctiva clear without scleral icterus. Mouth:  Oral mucosa pink and moist. Good dentition. No lesions. Heart:  S1, S2 present without murmurs Abdomen:  +BS, soft, TTP lower abdomen, left of umbilicus, and non-distended. No rebound or guarding. No HSM or masses noted. Msk:  Symmetrical without gross deformities. Normal posture. Extremities:  Without edema. Neurologic:  Alert and  oriented x4;  grossly normal neurologically. Skin:  Intact without significant lesions or rashes. Psych:  Alert and cooperative. Normal mood and affect.  Multiple CTs.  CT  WITH CONTRAST 01/14/14 IMPRESSION: No substantial interval change. Previously described abnormal soft tissue attenuation in the anterior abdominal wall, at the level of the umbilicus and to the left, is not substantially changed. As before this appears to have subtle central low-attenuation raising the question for chronic infection/abscess.  Status post distal gastrectomy and right adrenalectomy. No evidence for bowel obstruction. No acute intraperitoneal abnormality.  Fatty changes in the wall of the colon suggests a history of chronic inflammation. No evidence for colonic wall edema or pericolonic edema/ inflammation to suggest active inflammatory process at this Time.  Lab Results  Component Value Date   ALT 16 01/14/2014   AST 28 01/14/2014   ALKPHOS 87 01/14/2014   BILITOT 0.3 01/14/2014   Lab Results  Component Value Date   CREATININE 0.52 01/14/2014   BUN 7 01/14/2014   NA 144 01/14/2014   K 4.2 01/14/2014   CL 105 01/14/2014   CO2 24 01/14/2014   Lab Results  Component Value Date   WBC 4.9 01/14/2014   HGB 12.9 01/14/2014   HCT 36.8 01/14/2014   MCV 86.2 01/14/2014   PLT 317 01/14/2014

## 2014-01-21 NOTE — Assessment & Plan Note (Signed)
Early interval EGD as planned. 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 Followed closely at [facility]. Multiple CTs on file.

## 2014-01-21 NOTE — Patient Instructions (Signed)
Start taking Protonix twice a day, 30 minutes before breakfast and dinner.   Take zofran scheduled with meals, three times a day.   Use Carafate 4 times a day to coat your esophagus and stomach.   We have scheduled you for an upper endoscopy with Dr. Gala Romney as soon as possible. Stop drinking alcohol.   Go to the emergency room if you have large amounts of bloody vomit or black, tarry stool.

## 2014-01-21 NOTE — Discharge Instructions (Signed)
Nausea and Vomiting Take your stomach medications as prescribed and follow up as scheduled for your endoscopy. Return to the ED for worsening pain, vomiting that is uncontrolled or any other concerns. Nausea is a sick feeling that often comes before throwing up (vomiting). Vomiting is a reflex where stomach contents come out of your mouth. Vomiting can cause severe loss of body fluids (dehydration). Children and elderly adults can become dehydrated quickly, especially if they also have diarrhea. Nausea and vomiting are symptoms of a condition or disease. It is important to find the cause of your symptoms. CAUSES   Direct irritation of the stomach lining. This irritation can result from increased acid production (gastroesophageal reflux disease), infection, food poisoning, taking certain medicines (such as nonsteroidal anti-inflammatory drugs), alcohol use, or tobacco use.  Signals from the brain.These signals could be caused by a headache, heat exposure, an inner ear disturbance, increased pressure in the brain from injury, infection, a tumor, or a concussion, pain, emotional stimulus, or metabolic problems.  An obstruction in the gastrointestinal tract (bowel obstruction).  Illnesses such as diabetes, hepatitis, gallbladder problems, appendicitis, kidney problems, cancer, sepsis, atypical symptoms of a heart attack, or eating disorders.  Medical treatments such as chemotherapy and radiation.  Receiving medicine that makes you sleep (general anesthetic) during surgery. DIAGNOSIS Your caregiver may ask for tests to be done if the problems do not improve after a few days. Tests may also be done if symptoms are severe or if the reason for the nausea and vomiting is not clear. Tests may include:  Urine tests.  Blood tests.  Stool tests.  Cultures (to look for evidence of infection).  X-rays or other imaging studies. Test results can help your caregiver make decisions about treatment or the  need for additional tests. TREATMENT You need to stay well hydrated. Drink frequently but in small amounts.You may wish to drink water, sports drinks, clear broth, or eat frozen ice pops or gelatin dessert to help stay hydrated.When you eat, eating slowly may help prevent nausea.There are also some antinausea medicines that may help prevent nausea. HOME CARE INSTRUCTIONS   Take all medicine as directed by your caregiver.  If you do not have an appetite, do not force yourself to eat. However, you must continue to drink fluids.  If you have an appetite, eat a normal diet unless your caregiver tells you differently.  Eat a variety of complex carbohydrates (rice, wheat, potatoes, bread), lean meats, yogurt, fruits, and vegetables.  Avoid high-fat foods because they are more difficult to digest.  Drink enough water and fluids to keep your urine clear or pale yellow.  If you are dehydrated, ask your caregiver for specific rehydration instructions. Signs of dehydration may include:  Severe thirst.  Dry lips and mouth.  Dizziness.  Dark urine.  Decreasing urine frequency and amount.  Confusion.  Rapid breathing or pulse. SEEK IMMEDIATE MEDICAL CARE IF:   You have blood or brown flecks (like coffee grounds) in your vomit.  You have black or bloody stools.  You have a severe headache or stiff neck.  You are confused.  You have severe abdominal pain.  You have chest pain or trouble breathing.  You do not urinate at least once every 8 hours.  You develop cold or clammy skin.  You continue to vomit for longer than 24 to 48 hours.  You have a fever. MAKE SURE YOU:   Understand these instructions.  Will watch your condition.  Will get help right away  if you are not doing well or get worse. °Document Released: 03/04/2005 Document Revised: 05/27/2011 Document Reviewed: 08/01/2010 °[employer]® Patient Information ©2015 [employer]. This information is not intended to  replace advice given to you by your health care provider. Make sure you discuss any questions you have with your health care provider. ° °

## 2014-01-24 LAB — POC OCCULT BLOOD, ED: Fecal Occult Bld: NEGATIVE

## 2014-01-28 NOTE — Patient Instructions (Signed)
Angel Price  01/28/2014   Your procedure is scheduled on:  02/03/2014  Report to [facility] at  945  AM.  Call this number if you have problems the morning of surgery: [phone]   Remember:   Do not eat food or drink liquids after midnight.   Take these medicines the morning of surgery with A SIP OF WATER: abilify, lexapro,lisinopril,zofran, oxycodone, protonix, phenergan  Do not wear jewelry, make-up or nail polish.  Do not wear lotions, powders, or perfumes.   Do not shave 48 hours prior to surgery. Men may shave face and neck.  Do not bring valuables to the hospital.  [facility] is not responsible for any belongings or valuables.               Contacts, dentures or bridgework may not be worn into surgery.  Leave suitcase in the car. After surgery it may be brought to your room.  For patients admitted to the hospital, discharge time is determined by your treatment team.               Patients discharged the day of surgery will not be allowed to drive home.  Name and phone number of your driver: family  Special Instructions: N/A   Please read over the following fact sheets that you were given: Pain Booklet, Coughing and Deep Breathing, Surgical Site Infection Prevention, Anesthesia Post-op Instructions and Care and Recovery After Surgery Esophagogastroduodenoscopy Esophagogastroduodenoscopy (EGD) is a procedure to examine the lining of the esophagus, stomach, and first part of the small intestine (duodenum). A long, flexible, lighted tube with a camera attached (endoscope) is inserted down the throat to view these organs. This procedure is done to detect problems or abnormalities, such as inflammation, bleeding, ulcers, or growths, in order to treat them. The procedure lasts about 5-20 minutes. It is usually an outpatient procedure, but it may need to be performed in emergency cases in the hospital. LET YOUR CAREGIVER KNOW ABOUT:   Allergies to food or medicine.  All  medicines you are taking, including vitamins, herbs, eyedrops, and over-the-counter medicines and creams.  Use of steroids (by mouth or creams).  Previous problems you or members of your family have had with the use of anesthetics.  Any blood disorders you have.  Previous surgeries you have had.  Other health problems you have.  Possibility of pregnancy, if this applies. RISKS AND COMPLICATIONS  Generally, EGD is a safe procedure. However, as with any procedure, complications can occur. Possible complications include:  Infection.  Bleeding.  Tearing (perforation) of the esophagus, stomach, or duodenum.  Difficulty breathing or not being able to breath.  Excessive sweating.  Spasms of the larynx.  Slowed heartbeat.  Low blood pressure. BEFORE THE PROCEDURE  Do not eat or drink anything for 6-8 hours before the procedure or as directed by your caregiver.  Ask your caregiver about changing or stopping your regular medicines.  If you wear dentures, be prepared to remove them before the procedure.  Arrange for someone to drive you home after the procedure. PROCEDURE   A vein will be accessed to give medicines and fluids. A medicine to relax you (sedative) and a pain reliever will be given through that access into the vein.  A numbing medicine (local anesthetic) may be sprayed on your throat for comfort and to stop you from gagging or coughing.  A mouth guard may be placed in your mouth to protect your teeth and  to keep you from biting on the endoscope.  You will be asked to lie on your left side.  The endoscope is inserted down your throat and into the esophagus, stomach, and duodenum.  Air is put through the endoscope to allow your caregiver to view the lining of your esophagus clearly.  The esophagus, stomach, and duodenum is then examined. During the exam, your caregiver may:  Remove tissue to be examined under a microscope (biopsy) for inflammation, infection,  or other medical problems.  Remove growths.  Remove objects (foreign bodies) that are stuck.  Treat any bleeding with medicines or other devices that stop tissues from bleeding (hot cautery, clipping devices).  Widen (dilate) or stretch narrowed areas of the esophagus and stomach.  The endoscope will then be withdrawn. AFTER THE PROCEDURE  You will be taken to a recovery area to be monitored. You will be able to go home once you are stable and alert.  Do not eat or drink anything until the local anesthetic and numbing medicines have worn off. You may choke.  It is normal to feel bloated, have pain with swallowing, or have a sore throat for a short time. This will wear off.  Your caregiver should be able to discuss his or her findings with you. It will take longer to discuss the test results if any biopsies were taken. Document Released: 07/05/2004 Document Revised: 07/19/2013 Document Reviewed: 02/05/2012 [facility] Patient Information 2015 [place], [place]. This information is not intended to replace advice given to you by your health care provider. Make sure you discuss any questions you have with your health care provider. PATIENT INSTRUCTIONS POST-ANESTHESIA  IMMEDIATELY FOLLOWING SURGERY:  Do not drive or operate machinery for the first twenty four hours after surgery.  Do not make any important decisions for twenty four hours after surgery or while taking narcotic pain medications or sedatives.  If you develop intractable nausea and vomiting or a severe headache please notify your doctor immediately.  FOLLOW-UP:  Please make an appointment with your surgeon as instructed. You do not need to follow up with anesthesia unless specifically instructed to do so.  WOUND CARE INSTRUCTIONS (if applicable):  Keep a dry clean dressing on the anesthesia/puncture wound site if there is drainage.  Once the wound has quit draining you may leave it open to air.  Generally you should leave the bandage  intact for twenty four hours unless there is drainage.  If the epidural site drains for more than 36-48 hours please call the anesthesia department.  QUESTIONS?:  Please feel free to call your physician or the hospital operator if you have any questions, and they will be happy to assist you.

## 2014-01-31 ENCOUNTER — Encounter (HOSPITAL_COMMUNITY): Payer: Self-pay

## 2014-01-31 ENCOUNTER — Encounter (HOSPITAL_COMMUNITY)
Admit: 2014-01-31 | Discharge: 2014-01-31 | Disposition: A | Discharge status: Home / Self Care | Payer: Medicare Other | Attending: Internal Medicine | Admitting: Internal Medicine

## 2014-01-31 DIAGNOSIS — G8929 Other chronic pain: Secondary | ICD-10-CM | POA: Diagnosis not present

## 2014-01-31 DIAGNOSIS — F17210 Nicotine dependence, cigarettes, uncomplicated: Secondary | ICD-10-CM | POA: Diagnosis not present

## 2014-01-31 DIAGNOSIS — Z79899 Other long term (current) drug therapy: Secondary | ICD-10-CM | POA: Diagnosis not present

## 2014-01-31 DIAGNOSIS — R109 Unspecified abdominal pain: Secondary | ICD-10-CM | POA: Diagnosis present

## 2014-01-31 DIAGNOSIS — K920 Hematemesis: Secondary | ICD-10-CM | POA: Diagnosis not present

## 2014-01-31 DIAGNOSIS — Z9089 Acquired absence of other organs: Secondary | ICD-10-CM | POA: Diagnosis not present

## 2014-01-31 DIAGNOSIS — Z9884 Bariatric surgery status: Secondary | ICD-10-CM | POA: Diagnosis not present

## 2014-01-31 DIAGNOSIS — I10 Essential (primary) hypertension: Secondary | ICD-10-CM | POA: Diagnosis not present

## 2014-01-31 DIAGNOSIS — R197 Diarrhea, unspecified: Secondary | ICD-10-CM | POA: Diagnosis present

## 2014-01-31 DIAGNOSIS — Z9889 Other specified postprocedural states: Secondary | ICD-10-CM | POA: Diagnosis not present

## 2014-01-31 DIAGNOSIS — K449 Diaphragmatic hernia without obstruction or gangrene: Secondary | ICD-10-CM | POA: Diagnosis not present

## 2014-01-31 HISTORY — DX: Anxiety disorder, unspecified: F41.9

## 2014-01-31 HISTORY — DX: Sickle-cell trait: D57.3

## 2014-01-31 HISTORY — DX: Depression, unspecified: F32.A

## 2014-01-31 HISTORY — DX: Gastro-esophageal reflux disease without esophagitis: K21.9

## 2014-01-31 HISTORY — DX: Major depressive disorder, single episode, unspecified: F32.9

## 2014-01-31 LAB — SURGICAL PCR SCREEN
MRSA, PCR: NEGATIVE
Staphylococcus aureus: NEGATIVE

## 2014-01-31 LAB — PREGNANCY, URINE: Preg Test, Ur: NEGATIVE

## 2014-01-31 NOTE — Pre-Procedure Instructions (Signed)
Patient given information to sign up for my chart at home. 

## 2014-02-02 ENCOUNTER — Emergency Department (HOSPITAL_COMMUNITY): Payer: Medicare Other

## 2014-02-02 ENCOUNTER — Emergency Department (HOSPITAL_COMMUNITY)
Admission: EM | Admit: 2014-02-02 | Discharge: 2014-02-02 | Disposition: A | Discharge status: Home / Self Care | Payer: Medicare Other | Attending: Emergency Medicine | Admitting: Emergency Medicine

## 2014-02-02 ENCOUNTER — Encounter (HOSPITAL_COMMUNITY): Payer: Self-pay | Admitting: Cardiology

## 2014-02-02 DIAGNOSIS — Z872 Personal history of diseases of the skin and subcutaneous tissue: Secondary | ICD-10-CM | POA: Insufficient documentation

## 2014-02-02 DIAGNOSIS — R109 Unspecified abdominal pain: Secondary | ICD-10-CM

## 2014-02-02 DIAGNOSIS — Z9089 Acquired absence of other organs: Secondary | ICD-10-CM | POA: Insufficient documentation

## 2014-02-02 DIAGNOSIS — G8929 Other chronic pain: Secondary | ICD-10-CM

## 2014-02-02 DIAGNOSIS — Z720 Tobacco use: Secondary | ICD-10-CM | POA: Diagnosis not present

## 2014-02-02 DIAGNOSIS — Z9889 Other specified postprocedural states: Secondary | ICD-10-CM | POA: Insufficient documentation

## 2014-02-02 DIAGNOSIS — R112 Nausea with vomiting, unspecified: Secondary | ICD-10-CM | POA: Diagnosis not present

## 2014-02-02 DIAGNOSIS — I10 Essential (primary) hypertension: Secondary | ICD-10-CM | POA: Insufficient documentation

## 2014-02-02 DIAGNOSIS — Z8739 Personal history of other diseases of the musculoskeletal system and connective tissue: Secondary | ICD-10-CM | POA: Diagnosis not present

## 2014-02-02 DIAGNOSIS — R42 Dizziness and giddiness: Secondary | ICD-10-CM | POA: Diagnosis not present

## 2014-02-02 DIAGNOSIS — Z792 Long term (current) use of antibiotics: Secondary | ICD-10-CM | POA: Insufficient documentation

## 2014-02-02 DIAGNOSIS — K219 Gastro-esophageal reflux disease without esophagitis: Secondary | ICD-10-CM | POA: Insufficient documentation

## 2014-02-02 DIAGNOSIS — Z90710 Acquired absence of both cervix and uterus: Secondary | ICD-10-CM | POA: Diagnosis not present

## 2014-02-02 DIAGNOSIS — Z7951 Long term (current) use of inhaled steroids: Secondary | ICD-10-CM | POA: Insufficient documentation

## 2014-02-02 DIAGNOSIS — Z8639 Personal history of other endocrine, nutritional and metabolic disease: Secondary | ICD-10-CM | POA: Insufficient documentation

## 2014-02-02 DIAGNOSIS — Z79899 Other long term (current) drug therapy: Secondary | ICD-10-CM | POA: Diagnosis not present

## 2014-02-02 DIAGNOSIS — K625 Hemorrhage of anus and rectum: Secondary | ICD-10-CM | POA: Insufficient documentation

## 2014-02-02 DIAGNOSIS — Z862 Personal history of diseases of the blood and blood-forming organs and certain disorders involving the immune mechanism: Secondary | ICD-10-CM | POA: Diagnosis not present

## 2014-02-02 DIAGNOSIS — R1030 Lower abdominal pain, unspecified: Secondary | ICD-10-CM | POA: Diagnosis not present

## 2014-02-02 DIAGNOSIS — R52 Pain, unspecified: Secondary | ICD-10-CM

## 2014-02-02 LAB — CBC WITH DIFFERENTIAL/PLATELET
BASOS ABS: 0 10*3/uL (ref 0.0–0.1)
Basophils Relative: 0 % (ref 0–1)
EOS PCT: 1 % (ref 0–5)
Eosinophils Absolute: 0 10*3/uL (ref 0.0–0.7)
HEMATOCRIT: 33.5 % — AB (ref 36.0–46.0)
Hemoglobin: 12 g/dL (ref 12.0–15.0)
Lymphocytes Relative: 45 % (ref 12–46)
Lymphs Abs: 2 10*3/uL (ref 0.7–4.0)
MCH: 31.3 pg (ref 26.0–34.0)
MCHC: 35.8 g/dL (ref 30.0–36.0)
MCV: 87.5 fL (ref 78.0–100.0)
MONO ABS: 0.4 10*3/uL (ref 0.1–1.0)
Monocytes Relative: 9 % (ref 3–12)
Neutro Abs: 1.9 10*3/uL (ref 1.7–7.7)
Neutrophils Relative %: 45 % (ref 43–77)
Platelets: 224 10*3/uL (ref 150–400)
RBC: 3.83 MIL/uL — ABNORMAL LOW (ref 3.87–5.11)
RDW: 16.8 % — ABNORMAL HIGH (ref 11.5–15.5)
WBC: 4.3 10*3/uL (ref 4.0–10.5)

## 2014-02-02 LAB — BASIC METABOLIC PANEL
ANION GAP: 13 (ref 5–15)
BUN: 7 mg/dL (ref 6–23)
CALCIUM: 8.8 mg/dL (ref 8.4–10.5)
CO2: 27 meq/L (ref 19–32)
CREATININE: 0.62 mg/dL (ref 0.50–1.10)
Chloride: 100 mEq/L (ref 96–112)
GFR calc Af Amer: >90 mL/min (ref >90)
GLUCOSE: 86 mg/dL (ref 70–99)
Potassium: 3.9 mEq/L (ref 3.7–5.3)
Sodium: 140 mEq/L (ref 137–147)

## 2014-02-02 LAB — LIPASE, BLOOD: LIPASE: 15 U/L (ref 11–59)

## 2014-02-02 LAB — URINALYSIS, ROUTINE W REFLEX MICROSCOPIC
Bilirubin Urine: NEGATIVE
GLUCOSE, UA: NEGATIVE mg/dL
HGB URINE DIPSTICK: NEGATIVE
KETONES UR: NEGATIVE mg/dL
Leukocytes, UA: NEGATIVE
Nitrite: NEGATIVE
PROTEIN: NEGATIVE mg/dL
Specific Gravity, Urine: 1.02 (ref 1.005–1.030)
UROBILINOGEN UA: 0.2 mg/dL (ref 0.0–1.0)
pH: 5.5 (ref 5.0–8.0)

## 2014-02-02 LAB — HEPATIC FUNCTION PANEL
ALT: 9 U/L (ref 0–35)
AST: 25 U/L (ref 0–37)
Albumin: 4.4 g/dL (ref 3.5–5.2)
Alkaline Phosphatase: 146 U/L — ABNORMAL HIGH (ref 39–117)
TOTAL PROTEIN: 8.7 g/dL — AB (ref 6.0–8.3)
Total Bilirubin: 0.5 mg/dL (ref 0.3–1.2)

## 2014-02-02 LAB — POC OCCULT BLOOD, ED: Fecal Occult Bld: NEGATIVE

## 2014-02-02 MED ORDER — ONDANSETRON HCL 4 MG/2ML IJ SOLN
4.0000 mg | Freq: Once | INTRAMUSCULAR | Status: AC
  Administered 2014-02-02: 4 mg via INTRAVENOUS
  Filled 2014-02-02: qty 2

## 2014-02-02 MED ORDER — HYDROMORPHONE HCL 1 MG/ML IJ SOLN
1.0000 mg | Freq: Once | INTRAMUSCULAR | Status: AC
  Administered 2014-02-02: 1 mg via INTRAVENOUS
  Filled 2014-02-02: qty 1

## 2014-02-02 MED ORDER — HYDROMORPHONE HCL 1 MG/ML IJ SOLN
0.5000 mg | Freq: Once | INTRAMUSCULAR | Status: AC
  Administered 2014-02-02: 0.5 mg via INTRAVENOUS
  Filled 2014-02-02: qty 1

## 2014-02-02 NOTE — ED Notes (Signed)
Rectal bleeding this morning.  Nausea, vomiting, dizziness and lower abdominal pain this morning.  Pt states she has probably lost 15 lbs in the past 2 weeks.  Pt has EGD scheduled tomorrow with Dr. Gala Romney.

## 2014-02-02 NOTE — Discharge Instructions (Signed)
Abdominal Pain, Women °Abdominal (stomach, pelvic, or belly) pain can be caused by many things. It is important to tell your doctor: °· The location of the pain. °· Does it come and go or is it present all the time? °· Are there things that start the pain (eating certain foods, exercise)? °· Are there other symptoms associated with the pain (fever, nausea, vomiting, diarrhea)? °All of this is helpful to know when trying to find the cause of the pain. °CAUSES  °· Stomach: virus or bacteria infection, or ulcer. °· Intestine: appendicitis (inflamed appendix), regional ileitis (Crohn's disease), ulcerative colitis (inflamed colon), irritable bowel syndrome, diverticulitis (inflamed diverticulum of the colon), or cancer of the stomach or intestine. °· Gallbladder disease or stones in the gallbladder. °· Kidney disease, kidney stones, or infection. °· Pancreas infection or cancer. °· Fibromyalgia (pain disorder). °· Diseases of the female organs: °· Uterus: fibroid (non-cancerous) tumors or infection. °· Fallopian tubes: infection or tubal pregnancy. °· Ovary: cysts or tumors. °· Pelvic adhesions (scar tissue). °· Endometriosis (uterus lining tissue growing in the pelvis and on the pelvic organs). °· Pelvic congestion syndrome (female organs filling up with blood just before the menstrual period). °· Pain with the menstrual period. °· Pain with ovulation (producing an egg). °· Pain with an IUD (intrauterine device, birth control) in the uterus. °· Cancer of the female organs. °· Functional pain (pain not caused by a disease, may improve without treatment). °· Psychological pain. °· Depression. °DIAGNOSIS  °Your doctor will decide the seriousness of your pain by doing an examination. °· Blood tests. °· X-rays. °· Ultrasound. °· CT scan (computed tomography, special type of X-ray). °· MRI (magnetic resonance imaging). °· Cultures, for infection. °· Barium enema (dye inserted in the large intestine, to better view it with  X-rays). °· Colonoscopy (looking in intestine with a lighted tube). °· Laparoscopy (minor surgery, looking in abdomen with a lighted tube). °· Major abdominal exploratory surgery (looking in abdomen with a large incision). °TREATMENT  °The treatment will depend on the cause of the pain.  °· Many cases can be observed and treated at home. °· Over-the-counter medicines recommended by your caregiver. °· Prescription medicine. °· Antibiotics, for infection. °· Birth control pills, for painful periods or for ovulation pain. °· Hormone treatment, for endometriosis. °· Nerve blocking injections. °· Physical therapy. °· Antidepressants. °· Counseling with a psychologist or psychiatrist. °· Minor or major surgery. °HOME CARE INSTRUCTIONS  °· Do not take laxatives, unless directed by your caregiver. °· Take over-the-counter pain medicine only if ordered by your caregiver. Do not take aspirin because it can cause an upset stomach or bleeding. °· Try a clear liquid diet (broth or water) as ordered by your caregiver. Slowly move to a bland diet, as tolerated, if the pain is related to the stomach or intestine. °· Have a thermometer and take your temperature several times a day, and record it. °· Bed rest and sleep, if it helps the pain. °· Avoid sexual intercourse, if it causes pain. °· Avoid stressful situations. °· Keep your follow-up appointments and tests, as your caregiver orders. °· If the pain does not go away with medicine or surgery, you may try: °· Acupuncture. °· Relaxation exercises (yoga, meditation). °· Group therapy. °· Counseling. °SEEK MEDICAL CARE IF:  °· You notice certain foods cause stomach pain. °· Your home care treatment is not helping your pain. °· You need stronger pain medicine. °· You want your IUD removed. °· You feel faint or   lightheaded. °· You develop nausea and vomiting. °· You develop a rash. °· You are having side effects or an allergy to your medicine. °SEEK IMMEDIATE MEDICAL CARE IF:  °· Your  pain does not go away or gets worse. °· You have a fever. °· Your pain is felt only in portions of the abdomen. The right side could possibly be appendicitis. The left lower portion of the abdomen could be colitis or diverticulitis. °· You are passing blood in your stools (bright red or black tarry stools, with or without vomiting). °· You have blood in your urine. °· You develop chills, with or without a fever. °· You pass out. °MAKE SURE YOU:  °· Understand these instructions. °· Will watch your condition. °· Will get help right away if you are not doing well or get worse. °Document Released: 12/30/2006 Document Revised: 07/19/2013 Document Reviewed: 01/19/2009 °ExitCare® Patient Information ©2015 [employer]. This information is not intended to replace advice given to you by your health care provider. Make sure you discuss any questions you have with your health care provider. °Chronic Pain °Chronic pain can be defined as pain that is off and on and lasts for 3-6 months or longer. Many things cause chronic pain, which can make it difficult to make a diagnosis. There are many treatment options available for chronic pain. However, finding a treatment that works well for you may require trying various approaches until the right one is found. Many people benefit from a combination of two or more types of treatment to control their pain. °SYMPTOMS  °Chronic pain can occur anywhere in the body and can range from mild to very severe. Some types of chronic pain include: °· Headache. °· Low back pain. °· Cancer pain. °· Arthritis pain. °· Neurogenic pain. This is pain resulting from damage to nerves. ° People with chronic pain may also have other symptoms such as: °· Depression. °· Anger. °· Insomnia. °· Anxiety. °DIAGNOSIS  °Your health care provider will help diagnose your condition over time. In many cases, the initial focus will be on excluding possible conditions that could be causing the pain. Depending on your  symptoms, your health care provider may order tests to diagnose your condition. Some of these tests may include:  °· Blood tests.   °· CT scan.   °· MRI.   °· X-rays.   °· Ultrasounds.   °· Nerve conduction studies.   °You may need to see a specialist.  °TREATMENT  °Finding treatment that works well may take time. You may be referred to a pain specialist. He or she may prescribe medicine or therapies, such as:  °· Mindful meditation or yoga. °· Shots (injections) of numbing or pain-relieving medicines into the spine or area of pain. °· Local electrical stimulation. °· Acupuncture.   °· Massage therapy.   °· Aroma, color, light, or sound therapy.   °· Biofeedback.   °· Working with a physical therapist to keep from getting stiff.   °· Regular, gentle exercise.   °· Cognitive or behavioral therapy.   °· Group support.   °Sometimes, surgery may be recommended.  °HOME CARE INSTRUCTIONS  °· Take all medicines as directed by your health care provider.   °· Lessen stress in your life by relaxing and doing things such as listening to calming music.   °· Exercise or be active as directed by your health care provider.   °· Eat a healthy diet and include things such as vegetables, fruits, fish, and lean meats in your diet.   °· Keep all follow-up appointments with your health care provider.   °·   Attend a support group with others suffering from chronic pain. SEEK MEDICAL CARE IF:   Your pain gets worse.   You develop a new pain that was not there before.   You cannot tolerate medicines given to you by your health care provider.   You have new symptoms since your last visit with your health care provider.  SEEK IMMEDIATE MEDICAL CARE IF:   You feel weak.   You have decreased sensation or numbness.   You lose control of bowel or bladder function.   Your pain suddenly gets much worse.   You develop shaking.  You develop chills.  You develop confusion.  You develop chest pain.  You develop  shortness of breath.  MAKE SURE YOU:  Understand these instructions.  Will watch your condition.  Will get help right away if you are not doing well or get worse. Document Released: 11/24/2001 Document Revised: 11/04/2012 Document Reviewed: 08/28/2012 [facility] Patient Information 2015 [place], [place]. This information is not intended to replace advice given to you by your health care provider. Make sure you discuss any questions you have with your health care provider.     Emergency Department Resource Guide 1) Find a Doctor and Pay Out of Pocket Although you won't have to find out who is covered by your insurance plan, it is a good idea to ask around and get recommendations. You will then need to call the office and see if the doctor you have chosen will accept you as a new patient and what types of options they offer for patients who are self-pay. Some doctors offer discounts or will set up payment plans for their patients who do not have insurance, but you will need to ask so you aren't surprised when you get to your appointment.  2) Contact Your Local Health Department Not all health departments have doctors that can see patients for sick visits, but many do, so it is worth a call to see if yours does. If you don't know where your local health department is, you can check in your phone book. The CDC also has a tool to help you locate your state's health department, and many state websites also have listings of all of their local health departments.  3) Find a [facility] If your illness is not likely to be very severe or complicated, you may want to try a walk in clinic. These are popping up all over the country in pharmacies, drugstores, and shopping centers. They're usually staffed by nurse practitioners or physician assistants that have been trained to treat common illnesses and complaints. They're usually fairly quick and inexpensive. However, if you have serious medical issues or  chronic medical problems, these are probably not your best option.  No Primary Care Doctor: - Call Health Connect at  [phone] - they can help you locate a primary care doctor that  accepts your insurance, provides certain services, etc. - Physician Referral Service- [phone]  Chronic Pain Problems: Organization         Address  Phone   Notes  [facility]  [phone] Patients need to be referred by their primary care doctor.   Medication Assistance: Organization         Address  Phone   Notes  [facility] Medication [facility] [address] [place], [place] [address] [phone] --Must be a resident of [facility] -- Must have NO insurance coverage whatsoever (no Medicaid/ Medicare, etc.) -- The  pt. MUST have a primary care doctor that directs their care regularly and follows them in the community   MedAssist  [phone]   [employer]  [phone]    Agencies that provide inexpensive medical care: Organization         Address  Phone   Notes  [place]  [phone]   [facility]    [phone]   [facility] [place], [place] [address] [phone]   [place] [address] [place] [phone]   Planned Parenthood    [phone]   [facility]    [phone]   [place] and [place] [address], [place] Phone:  [phone], Fax:  [phone] Hours of Operation:  9 am - 6 pm, M-F.  Also accepts Medicaid/Medicare and self-pay.  [facility] for [place], [address], [place] Phone: [phone], Fax: [phone]. Hours of Operation:  8:30 am - 5:30 pm, M-F.  Also accepts Medicaid and self-pay.  [facility] [place] [address] [place] Phone: [phone]   [place], [place], [place]  [phone], Ext. [phone] Mondays & Thursdays: 7-9 AM.  First 15 patients are seen on a first come, first serve basis.    [place] Providers:  Organization         Address  Phone   Notes  [facility] [address], [place] [phone] Also accepts self-pay patients.  [facility] [address], [place]  [phone]   [place], [address], [place] [phone]   [facility] [address], [place] [phone]   Lucianne Lei [address], [place]   [phone] Only accepts [place] Access [place] patients after they have their name applied to their card.   Self-Pay (no insurance) in [facility]:  Organization         Address  Phone   Notes  Sickle Cell Patients, [facility] [place] [phone]   [facility] Urgent Care [place] [phone]   [facility] [place]  [place], [place], [place] [phone]   [facility]/Dr. Osei-Bonsu  [address] [place] or [address], [place] [phone] Phone number for both [place] and [place] locations is the same.  [facility]gent [facility] [address] [place] [phone]   [facility] [address] [place] or [address] [phone]   [facility] [address] [place] [phone], phone; [phone], fax Sees patients 1st and 3rd Saturday of every month.  Must not qualify for public or private insurance (i.e. Medicaid, Medicare, [place] Health Choice, Veterans' Benefits)  Household income should be no more than 200% of the poverty level The clinic cannot treat you if you are pregnant or think you are pregnant  Sexually transmitted  diseases are not treated at the clinic.    Dental Care: Organization         Address  Phone  Notes  [facility] [address]  [place], [place] [phone] Accepts children up to age 48 who are enrolled in Medicaid or [place] Health Choice; pregnant women with a Medicaid card; and children who have applied for Medicaid or [place] Health Choice, but were declined, whose parents can pay a reduced fee at time of service.  [facility] Department of [facility]  [address], [place] [phone] Accepts children up to age 63 who are enrolled in [place] or [place]; pregnant women with a Medicaid card; and children who have applied for Medicaid or [place] Health Choice, but were declined, whose parents can pay a reduced fee at time of service.  [place] Adult Dental Access PROGRAM  [place] [phone] Patients are seen by appointment only. Walk-ins are not accepted. [place] will see patients 69 years of age and older. Monday - Tuesday (8am-5pm) Most Wednesdays (8:30-5pm) $30 per visit, cash only  [facility] Adult Dental Access PROGRAM  [address], [facility] [phone] Patients are seen by appointment only. Walk-ins are not accepted. [place] will see patients 88 years of age and older. One Wednesday Evening (Monthly: Volunteer Based).  $30 per visit, cash only  [place]  [phone] for adults; Children under age 44, call Graduate Pediatric Dentistry at [phone]. Children aged 40-14, please call [phone] to request a pediatric application.  Dental services are provided in all areas of dental care including fillings, crowns and bridges, complete and partial dentures, implants, gum treatment, root canals, and extractions. Preventive care is also provided. Treatment is provided to both adults and children. Patients are selected via a  lottery and there is often a waiting list.   [facility] [address] [place]  [phone] [email or web address]   [facility] [address] [place], [place] [phone], Ext. [phone] Second and Fourth Thursday of each month, opens at 6:30 AM; Clinic ends at 9 AM.  Patients are seen on a first-come first-served basis, and a limited number are seen during each clinic.   [facility]  [address] Hillard Danker [place], [place] [phone]   Eligibility Requirements You must have lived in [place], [place], or [place] counties for at least the last three months.   You cannot be eligible for state or federal sponsored [employer], including [employer], [place], or [employer].   You generally cannot be eligible for healthcare insurance through your employer.    How to apply: Eligibility screenings are held every Tuesday and Wednesday afternoon from 1:00 pm until 4:00 pm. You do not need an appointment for the interview!  [facility] [address], [place], [place]   [place]  [place] Department  [place]  [phone]    Behavioral Health Resources in the Community: Intensive Outpatient Programs Organization         Address  Phone  Notes  [place]. [address] [place], [place] [phone]   [facility] Outpatient [address], [place], [place]   ADS: [facility] [address], [place], [place]   [place] [address],  [place], [place] or [phone]   Substance Abuse Resources Organization         Address  Phone  Notes  Alcohol and Drug  Services  [place]  [phone]   [facility]   Chinita Pester  [phone]   Residential &  Outpatient Substance Abuse Program  [phone]   Psychological Services Organization         Address  Phone  Notes  [place]  [place]  [phone]   [place] [address], [place] or [phone]    Mobile Crisis Teams Organization         Address  Phone  Notes  [facility], Mobile Crisis Care Unit  [phone]   [facility]  [address] [place], [place]   [place] [address], [place] [phone]    Self-Help/Support Groups Organization         Address  Phone             Notes  [place]. of [place] - variety of support groups  [place] Call for more information  Narcotics Anonymous (NA), [facility] [address], [employer] [place]  2 meetings at this location   Special educational needs teacher         Address  Phone  Notes  ASAP Residential Treatment [place],    [place]  [phone]   [facility]  [address], [place] [address]858, [place], [place]   [place], [place] [phone] Admissions: 8am-3pm M-F  Incentives Substance [place] [address]    [place], [place] [phone]   [facility] [address] [place], [place], [place]   [facility] [address]  [place], [place]   [facility] Programs - Intensive Outpatient [address] Kristeen [address], [place], [place]   [facility] ([place].,  [place], [place] [phone] or [phone]   [facility] Services (RTS) [address], [place], [place] Accepts Medicaid  [facility] [place] [address],  [place] [phone] Substance Abuse/Addiction Treatment   [facility] Organization          Address  Phone  Notes  [facility]  [phone]   Domenic Schwab, PhD [address] [place], [place]   [phone] or [phone]   [place], [place] [phone]   [facility] [address], [place], [place] [phone] Insurance/Medicaid/sponsorship through [facility] and Families [address], [address] [place]                                    [place], [place] [phone] [place] [address], [place] [phone]    Dr. Adele Schilder  [phone]   [facility] Dept. 1) [address] [place] 2) [place] 3)  [place] [address], [place] [phone]  [phone]   [place] [phone] or [phone] (After Hours)

## 2014-02-02 NOTE — ED Provider Notes (Signed)
TIME SEEN: 1:35 PM  CHIEF COMPLAINT: abdominal pain, rectal bleeding  HPI: Pt is a 48 y.o. F with lupus, hypertension, chronic abdominal pain who presents to the ED with reports of rectal bleeding this morning; she describes her bowel movement as "burgundy," with dark red blood. She reports nausea, vomiting, dizziness and lower abdominal pain at present. She reports prior experience with rectal bleeding; she is unsure of the cause of that incident at this time. She has chronic bowel pain but reports that this feels different to her because it is lower. She denies vaginal bleeding or discharge. No dysuria or hematuria.  No fever, chills.  She is not sure when her last bowel movement was. She reports she is not passing gas.  She reports a prior surgical history: distal gastrectomy, cholecystectomy, hysterectomy, right adrenal resection, prior abdominal abscess. She has had prior bowel obstructions.  She believes her last abdominal surgery for "debridement", in November 2014, has not healed well; it has not stopped draining since the surgery. Most of her abdominal surgeries were done in [place], [place].  She is also following a [facility], last seen October 2015.  Last colonoscopy in March 2015; unable to do a complete colonoscopy (patient was not cleaned out all the way). Has EGD with Rourk scheduled tomorrow.    ROS: See HPI Constitutional: no fever  Eyes: no drainage  ENT: no runny nose   Cardiovascular:  no chest pain  Resp: no SOB  GI: no vomiting GU: no dysuria Integumentary: no rash  Allergy: no hives  Musculoskeletal: no leg swelling  Neurological: no slurred speech ROS otherwise negative  PAST MEDICAL HISTORY/PAST SURGICAL HISTORY:  Past Medical History  Diagnosis Date  . Lupus   . 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 Hypertension   . Abscess     soft tissue  . Suicide attempt   . Blood transfusion without reported diagnosis   . Chronic abdominal pain   . Chronic  wound infection of abdomen   . Adrenal mass   . Anxiety   . Depression   . GERD (gastroesophageal reflux disease)   . Sickle cell trait     MEDICATIONS:  Prior to Admission medications   Medication Sig Start Date End Date Taking? Authorizing Provider  ARIPiprazole (ABILIFY) 5 MG tablet Take 1 tablet (5 mg total) by mouth 2 (two) times daily. 07/16/13   Levonne Spiller, MD  escitalopram (LEXAPRO) 20 MG tablet Take 1 tablet (20 mg total) by mouth daily. 07/16/13 07/16/14  Levonne Spiller, MD  lisinopril (PRINIVIL,ZESTRIL) 10 MG tablet Take 10 mg by mouth daily.    Historical Provider, MD  mupirocin ointment (BACTROBAN) 2 % Place into the nose 2 (two) times daily. 12/13/13   Maryann Mikhail, DO  ondansetron (ZOFRAN) 4 MG tablet Take 1 tablet (4 mg total) by mouth 3 (three) times daily. With meals 01/21/14   Orvil Feil, NP  oxyCODONE-acetaminophen (PERCOCET) 5-325 MG per tablet Take 1-2 tablets by mouth every 4 (four) hours as needed. Patient not taking: Reported on 01/21/2014 01/14/14   [name], MD  pantoprazole (PROTONIX) 40 MG tablet Take 1 tablet (40 mg total) by mouth 2 (two) times daily before a meal. 01/21/14   Orvil Feil, NP  promethazine (PHENERGAN) 25 MG tablet Take 1 tablet (25 mg total) by mouth every 6 (six) hours as needed. 01/14/14   [name], MD  sucralfate (CARAFATE) 1 GM/10ML suspension Take 10 mLs (1 g total) by mouth  4 (four) times daily. 01/21/14   Orvil Feil, NP    ALLERGIES:  Allergies  Allergen Reactions  . Codeine Hives  . Morphine And Related Hives    gastritis    SOCIAL HISTORY:  History  Substance Use Topics  . Smoking status: Current Every Day Smoker -- 0.25 packs/day for 35 years    Types: Cigarettes  . Smokeless tobacco: Never Used  . Alcohol Use: 7.2 oz/week    0 Not specified, 12 Cans of beer per week     Comment: as of 11/6: daily but "calmed down", not like I used to. 6 pack of beer daily.     FAMILY HISTORY: Family History  Problem Relation Age of  Onset  . Brain cancer Son   . Schizophrenia Son   . Cancer Son     brain  . Breast cancer Maternal Aunt   . Bipolar disorder Maternal Aunt   . Drug abuse Maternal Aunt   . Colon cancer Maternal Grandmother     late 47s, early 16s  . Lung cancer Father   . Cancer Father     mets  . Liver disease Neg Hx   . Drug abuse Mother   . Drug abuse Sister   . Drug abuse Brother   . Bipolar disorder Paternal Grandfather   . Bipolar disorder Cousin     EXAM: BP 125/98 mmHg  Pulse 92  Temp(Src) 98.2 F (36.8 C) (Oral)  Resp 18  Ht 5\' 3"  (1.6 m)  Wt 129 lb (58.514 kg)  BMI 22.86 kg/m2  SpO2 99% CONSTITUTIONAL: Alert and oriented and responds appropriately to questions. Well-appearing; well-nourished HEAD: Normocephalic EYES: Conjunctivae clear, PERRL ENT: normal nose; no rhinorrhea; moist mucous membranes; pharynx without lesions noted NECK: Supple, no meningismus, no LAD  CARD: RRR; S1 and S2 appreciated; no murmurs, no clicks, no rubs, no gallops RESP: Normal chest excursion without splinting or tachypnea; breath sounds clear and equal bilaterally; no wheezes, no rhonchi, no rales,  ABD/GI: Normal bowel sounds; non-distended; soft, multiple abdominal surgical scars; 1cm superficial area that's open with small amount of yellow drainage without odor, no surrounding warmth or erythema, mildly TTP diffusely without guarding or rebound; no peritoneal signs; no tympany or fluid wave  RECTAL: small amount of bright red substance that may be blood on rectal exam, no melena, no stool in the rectal vault, normal rectal tone, no hemorrhoids  BACK:  The back appears normal and is non-tender to palpation, there is no CVA tenderness EXT: Normal ROM in all joints; non-tender to palpation; no edema; normal capillary refill; no cyanosis    SKIN: Normal color for age and race; warm NEURO: Moves all extremities equally PSYCH: The patient's mood and manner are appropriate. Grooming and personal hygiene  are appropriate.  MEDICAL DECISION MAKING: Pt here with chronic abdominal pain with multiple visits for the same presents today with abdominal pain, nausea, vomiting and rectal bleeding. Her abdominal exam seems very benign today and she is hemodynamically stable, afebrile, nontoxic appearing. She does have a small amount of blood on rectal exam but no melena. No active hemorrhaging. Patient has had multiple CT scans which show no acute abnormalities, last was 12 days ago. Concern for repeated radiation exposure and I feel it will give [place] a little information. Her labs ordered in triage had been unremarkable thus far and show a normal white count. We'll add on LFTs, lipase and urinalysis. We'll also obtain acute abdominal series to evaluate her bowel  gas pattern and the look for obstruction although I feel this is less likely given she has no distention, tympany and has normal bowel sounds. She does have some drainage from an abdominal wound and has a known chronic abdominal abscess but there is no surrounding erythema or warmth and she states this drainage has not been getting worse but instead has been getting better since November.  ED PROGRESS: Fecal occult is negative for blood.  Patient's labs are unremarkable. Urine shows no sign of infection. Acute abdominal series shows no bowel obstruction but there are some mildly prominent loops of small bowel in the left mid abdomen that is nonspecific. She also has a mild patchy opacity in the left lung base suspicious for pneumonia but has no fever, cough or leukocytosis. Clinically I do not feel that she has pneumonia. She is not hypoxic, short of breath. I do not feel she needed to be treated for this at this time. She is asking for something to eat and drink. She is not having any further vomiting in the ED. I feel she is safe to be discharged home with close outpatient follow-up. Have recommended that she talk to her primary care physician about establishing  care with pain management given she is here multiple times for chronic abdominal pain. Discussed with patient the emergency department is not the most appropriate place to treat chronic pain. We'll discharge home. Discussed return precautions.      Earl Park, DO 02/02/14 1525

## 2014-02-03 ENCOUNTER — Ambulatory Visit (HOSPITAL_COMMUNITY): Payer: Medicare Other | Admitting: Anesthesiology

## 2014-02-03 ENCOUNTER — Encounter (HOSPITAL_COMMUNITY)
Admission: RE | Disposition: A | Discharge status: Home / Self Care | Payer: Self-pay | Source: Ambulatory Visit | Attending: Internal Medicine

## 2014-02-03 ENCOUNTER — Ambulatory Visit (HOSPITAL_COMMUNITY)
Admission: RE | Admit: 2014-02-03 | Discharge: 2014-02-03 | Disposition: A | Discharge status: Home / Self Care | Payer: Medicare Other | Source: Ambulatory Visit | Attending: Internal Medicine | Admitting: Internal Medicine

## 2014-02-03 ENCOUNTER — Encounter (HOSPITAL_COMMUNITY): Payer: Self-pay | Admitting: *Deleted

## 2014-02-03 ENCOUNTER — Other Ambulatory Visit: Payer: Self-pay

## 2014-02-03 DIAGNOSIS — Z9884 Bariatric surgery status: Secondary | ICD-10-CM | POA: Insufficient documentation

## 2014-02-03 DIAGNOSIS — Z79899 Other long term (current) drug therapy: Secondary | ICD-10-CM | POA: Insufficient documentation

## 2014-02-03 DIAGNOSIS — Z9089 Acquired absence of other organs: Secondary | ICD-10-CM | POA: Insufficient documentation

## 2014-02-03 DIAGNOSIS — F17210 Nicotine dependence, cigarettes, uncomplicated: Secondary | ICD-10-CM | POA: Insufficient documentation

## 2014-02-03 DIAGNOSIS — K920 Hematemesis: Secondary | ICD-10-CM | POA: Insufficient documentation

## 2014-02-03 DIAGNOSIS — I10 Essential (primary) hypertension: Secondary | ICD-10-CM | POA: Insufficient documentation

## 2014-02-03 DIAGNOSIS — Z9889 Other specified postprocedural states: Secondary | ICD-10-CM | POA: Insufficient documentation

## 2014-02-03 DIAGNOSIS — K449 Diaphragmatic hernia without obstruction or gangrene: Secondary | ICD-10-CM | POA: Diagnosis not present

## 2014-02-03 DIAGNOSIS — G8929 Other chronic pain: Secondary | ICD-10-CM | POA: Insufficient documentation

## 2014-02-03 DIAGNOSIS — K3184 Gastroparesis: Secondary | ICD-10-CM

## 2014-02-03 DIAGNOSIS — R112 Nausea with vomiting, unspecified: Secondary | ICD-10-CM | POA: Diagnosis not present

## 2014-02-03 HISTORY — PX: ESOPHAGOGASTRODUODENOSCOPY (EGD) WITH PROPOFOL: SHX5813

## 2014-02-03 SURGERY — ESOPHAGOGASTRODUODENOSCOPY (EGD) WITH PROPOFOL
Anesthesia: Monitor Anesthesia Care

## 2014-02-03 MED ORDER — FENTANYL CITRATE 0.05 MG/ML IJ SOLN
INTRAMUSCULAR | Status: AC
  Filled 2014-02-03: qty 2

## 2014-02-03 MED ORDER — PROPOFOL 10 MG/ML IV BOLUS
INTRAVENOUS | Status: AC
Start: 2014-02-03 — End: 2014-02-03
  Filled 2014-02-03: qty 20

## 2014-02-03 MED ORDER — LACTATED RINGERS IV SOLN
INTRAVENOUS | Status: DC | PRN
  Administered 2014-02-03: 09:00:00 via INTRAVENOUS

## 2014-02-03 MED ORDER — ONDANSETRON HCL 4 MG/2ML IJ SOLN
4.0000 mg | Freq: Once | INTRAMUSCULAR | Status: AC | PRN
  Administered 2014-02-03: 4 mg via INTRAVENOUS

## 2014-02-03 MED ORDER — PROPOFOL 10 MG/ML IV BOLUS
INTRAVENOUS | Status: DC | PRN
  Administered 2014-02-03: 50 mg via INTRAVENOUS
  Administered 2014-02-03: 100 mg via INTRAVENOUS

## 2014-02-03 MED ORDER — PROPOFOL 10 MG/ML IV BOLUS
INTRAVENOUS | Status: AC
  Filled 2014-02-03: qty 20

## 2014-02-03 MED ORDER — FENTANYL CITRATE 0.05 MG/ML IJ SOLN
INTRAMUSCULAR | Status: DC | PRN
  Administered 2014-02-03 (×4): 25 ug via INTRAVENOUS

## 2014-02-03 MED ORDER — LIDOCAINE VISCOUS 2 % MT SOLN
3.0000 mL | Freq: Once | OROMUCOSAL | Status: AC
  Administered 2014-02-03: 3 mL via OROMUCOSAL
  Filled 2014-02-03: qty 5

## 2014-02-03 MED ORDER — FENTANYL CITRATE 0.05 MG/ML IJ SOLN
INTRAMUSCULAR | Status: AC
Start: 2014-02-03 — End: 2014-02-03
  Filled 2014-02-03: qty 2

## 2014-02-03 MED ORDER — ONDANSETRON HCL 4 MG/2ML IJ SOLN
INTRAMUSCULAR | Status: AC
  Filled 2014-02-03: qty 2

## 2014-02-03 MED ORDER — ONDANSETRON HCL 4 MG/2ML IJ SOLN
INTRAMUSCULAR | Status: DC | PRN
  Administered 2014-02-03: 4 mg via INTRAVENOUS

## 2014-02-03 MED ORDER — FENTANYL CITRATE 0.05 MG/ML IJ SOLN
25.0000 ug | INTRAMUSCULAR | Status: DC | PRN
  Administered 2014-02-03 (×2): 50 ug via INTRAVENOUS

## 2014-02-03 MED ORDER — STERILE WATER FOR IRRIGATION IR SOLN
Status: DC | PRN
  Administered 2014-02-03: 10:00:00

## 2014-02-03 MED ORDER — LIDOCAINE VISCOUS 2 % MT SOLN
OROMUCOSAL | Status: AC
  Administered 2014-02-03: 3 mL via OROMUCOSAL
  Filled 2014-02-03: qty 15

## 2014-02-03 MED ORDER — MIDAZOLAM HCL 2 MG/2ML IJ SOLN
INTRAMUSCULAR | Status: AC
  Filled 2014-02-03: qty 2

## 2014-02-03 MED ORDER — LACTATED RINGERS IV SOLN
INTRAVENOUS | Status: DC
  Administered 2014-02-03: 09:00:00 via INTRAVENOUS

## 2014-02-03 MED ORDER — FENTANYL CITRATE 0.05 MG/ML IJ SOLN
25.0000 ug | INTRAMUSCULAR | Status: AC
  Administered 2014-02-03 (×2): 25 ug via INTRAVENOUS

## 2014-02-03 MED ORDER — PROPOFOL INFUSION 10 MG/ML OPTIME
INTRAVENOUS | Status: DC | PRN
  Administered 2014-02-03: 100 ug/kg/min via INTRAVENOUS

## 2014-02-03 MED ORDER — ONDANSETRON HCL 4 MG/2ML IJ SOLN
4.0000 mg | Freq: Once | INTRAMUSCULAR | Status: AC
  Administered 2014-02-03: 4 mg via INTRAVENOUS

## 2014-02-03 MED ORDER — LIDOCAINE HCL (PF) 1 % IJ SOLN
INTRAMUSCULAR | Status: AC
  Filled 2014-02-03: qty 5

## 2014-02-03 MED ORDER — MIDAZOLAM HCL 2 MG/2ML IJ SOLN
1.0000 mg | INTRAMUSCULAR | Status: DC | PRN
  Administered 2014-02-03: 2 mg via INTRAVENOUS

## 2014-02-03 SURGICAL SUPPLY — 20 items
BLOCK BITE 60FR ADLT L/F BLUE (MISCELLANEOUS) ×1 IMPLANT
DEVICE CLIP HEMOSTAT 235CM (CLIP) IMPLANT
ELECT REM PT RETURN 9FT ADLT (ELECTROSURGICAL)
ELECTRODE REM PT RTRN 9FT ADLT (ELECTROSURGICAL) IMPLANT
FLOOR PAD 36X40 (MISCELLANEOUS) ×2
FORCEPS BIOP RAD 4 LRG CAP 4 (CUTTING FORCEPS) IMPLANT
FORMALIN 10 PREFIL 20ML (MISCELLANEOUS) IMPLANT
KIT CLEAN ENDO COMPLIANCE (KITS) ×1 IMPLANT
MANIFOLD NEPTUNE II (INSTRUMENTS) ×1 IMPLANT
NDL SCLEROTHERAPY 25GX240 (NEEDLE) IMPLANT
NEEDLE SCLEROTHERAPY 25GX240 (NEEDLE) IMPLANT
PAD FLOOR 36X40 (MISCELLANEOUS) IMPLANT
PROBE APC STR FIRE (PROBE) IMPLANT
PROBE INJECTION GOLD (MISCELLANEOUS)
PROBE INJECTION GOLD 7FR (MISCELLANEOUS) IMPLANT
SNARE ROTATE MED OVAL 20MM (MISCELLANEOUS) IMPLANT
SNARE SHORT THROW 13M SML OVAL (MISCELLANEOUS) ×1 IMPLANT
SYR 50ML LL SCALE MARK (SYRINGE) IMPLANT
TUBING IRRIGATION ENDOGATOR (MISCELLANEOUS) ×1 IMPLANT
WATER STERILE IRR 1000ML POUR (IV SOLUTION) ×1 IMPLANT

## 2014-02-03 NOTE — Transfer of Care (Signed)
Immediate Anesthesia Transfer of Care Note  Patient: Angel Price  Procedure(s) Performed: Procedure(s): ESOPHAGOGASTRODUODENOSCOPY (EGD) WITH PROPOFOL (N/A)  Patient Location: PACU  Anesthesia Type:MAC  Level of Consciousness: awake, alert , oriented, patient cooperative and responds to stimulation  Airway & Oxygen Therapy: Patient Spontanous Breathing and Patient connected to nasal cannula oxygen  Post-op Assessment: Report given to PACU RN, Post -op Vital signs reviewed and stable and Patient moving all extremities  Post vital signs: Reviewed and stable  Complications: No apparent anesthesia complications

## 2014-02-03 NOTE — Addendum Note (Signed)
Addendum  created 02/03/14 1113 by Vista Deck, CRNA   Modules edited: Charges VN

## 2014-02-03 NOTE — Anesthesia Procedure Notes (Signed)
Procedure Name: MAC Date/Time: 02/03/2014 9:39 AM Performed by: Michele Rockers Pre-anesthesia Checklist: Patient identified, Emergency Drugs available, Suction available, Timeout performed and Patient being monitored Patient Re-evaluated:Patient Re-evaluated prior to inductionOxygen Delivery Method: Non-rebreather mask

## 2014-02-03 NOTE — Interval H&P Note (Signed)
History and Physical Interval Note:  02/03/2014 9:24 AM  Angel Price  has presented today for surgery, with the diagnosis of hematemesis  The various methods of treatment have been discussed with the patient and family. After consideration of risks, benefits and other options for treatment, the patient has consented to  Procedure(s) with comments: ESOPHAGOGASTRODUODENOSCOPY (EGD) WITH PROPOFOL (N/A) - 1230 - moved to 9:15 as a surgical intervention .  The patient's history has been reviewed, patient examined, no change in status, stable for surgery.  I have reviewed the patient's chart and labs.  Questions were answered to the patient's satisfaction.     Angel Price  No change. Plan for EGD today. Early interval colonoscopy scheduled for March 2016.The risks, benefits, limitations, alternatives and imponderables have been reviewed with the patient. Potential for esophageal dilation, biopsy, etc. have also been reviewed.  Questions have been answered. All parties agreeable.

## 2014-02-03 NOTE — Discharge Instructions (Signed)
EGD Discharge instructions Please read the instructions outlined below and refer to this sheet in the next few weeks. These discharge instructions provide you with general information on caring for yourself after you leave the hospital. Your doctor may also give you specific instructions. While your treatment has been planned according to the most current medical practices available, unavoidable complications occasionally occur. If you have any problems or questions after discharge, please call your doctor. ACTIVITY  You may resume your regular activity but move at a slower pace for the next 24 hours.   Take frequent rest periods for the next 24 hours.   Walking will help expel (get rid of) the air and reduce the bloated feeling in your abdomen.   No driving for 24 hours (because of the anesthesia (medicine) used during the test).   You may shower.   Do not sign any important legal documents or operate any machinery for 24 hours (because of the anesthesia used during the test).  NUTRITION  Drink plenty of fluids.   You may resume your normal diet.   Begin with a light meal and progress to your normal diet.   Avoid alcoholic beverages for 24 hours or as instructed by your caregiver.  MEDICATIONS  You may resume your normal medications unless your caregiver tells you otherwise.  WHAT YOU CAN EXPECT TODAY  You may experience abdominal discomfort such as a feeling of fullness or gas pains.  FOLLOW-UP  Your doctor will discuss the results of your test with you.  SEEK IMMEDIATE MEDICAL ATTENTION IF ANY OF THE FOLLOWING OCCUR:  Excessive nausea (feeling sick to your stomach) and/or vomiting.   Severe abdominal pain and distention (swelling).   Trouble swallowing.   Temperature over 101 F (37.8 C).   Rectal bleeding or vomiting of blood.    Schedule solid-phase gastric emptying study to evaluate for gastroparesis (Ginger in office notified and will contact patient with  scheduled time)

## 2014-02-03 NOTE — Anesthesia Postprocedure Evaluation (Signed)
  Anesthesia Post-op Note  Patient: Angel Price  Procedure(s) Performed: Procedure(s): ESOPHAGOGASTRODUODENOSCOPY (EGD) WITH PROPOFOL (N/A)  Patient Location: PACU  Anesthesia Type:MAC  Level of Consciousness: awake, alert , oriented, patient cooperative and responds to stimulation  Airway and Oxygen Therapy: Patient Spontanous Breathing and Patient connected to nasal cannula oxygen  Post-op Pain: none  Post-op Assessment: Post-op Vital signs reviewed, Patient's Cardiovascular Status Stable, Respiratory Function Stable, Patent Airway, No signs of Nausea or vomiting and Pain level controlled  Post-op Vital Signs: Reviewed and stable  Last Vitals:  Filed Vitals:   02/03/14 0935  BP: 120/86  Pulse:   Temp:   Resp: 17    Complications: No apparent anesthesia complications

## 2014-02-03 NOTE — Interval H&P Note (Signed)
History and Physical Interval Note:  02/03/2014 9:21 AM  Angel Price  has presented today for surgery, with the diagnosis of hematemesis  The various methods of treatment have been discussed with the patient and family. After consideration of risks, benefits and other options for treatment, the patient has consented to  Procedure(s) with comments: ESOPHAGOGASTRODUODENOSCOPY (EGD) WITH PROPOFOL (N/A) - 1230 - moved to 9:15 as a surgical intervention .  The patient's history has been reviewed, patient examined, no change in status, stable for surgery.  I have reviewed the patient's chart and labs.  Questions were answered to the patient's satisfaction.     Manus Rudd

## 2014-02-03 NOTE — Addendum Note (Signed)
Addendum  created 02/03/14 1126 by Vista Deck, CRNA   Modules edited: Charges VN

## 2014-02-03 NOTE — H&P (View-Only) (Signed)
Primary Care Physician:  Doran Heater, MD  Primary GI: Dr. Gala Romney   Chief Complaint  Patient presents with  . Follow-up    Rectal Bleeding    HPI:   Angel Price is a 48 year old female presenting today with history of abdominal pain, abnormal LFTs, diarrhea, N/V. Colonoscopy/EGD in March 2015 with normal esophagus, residual gastric mucosa and patent efferent limb, inadequate colon prep for polyp detection. Normal TI. Needs early interval colonoscopy in March 2016.  MRCP/MRI with common hepatic duct, cystic duct remnant, and CBD all mildly dilated measuring up to 40mm, which is likely related to post-cholecystectomy state. The CBD appeared to abruptly taper immediately before level of ampulla, unable to exlude distal CBD stricture. 3 additional CTs with contrast since that time with persistent prominence of extrahepatic bile ducts, favored to be post-cholecystectomy related. 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 Multiple prior bowel operations and abdominal wall abscesses.    Notes pain to left of umbilicus and incision site "leaking". Followed at [facility]. Seeing a Education officer, environmental, plastic surgeon, and oncologist with history of adrenal adenoma. Pain is constant. N/V every day. States "blood in vomit". Unable to quantify amount. No melena. Hematemesis yesterday. Notes epigastric discomfort with eating. Usually vomits with eating. States throwing up undigested food. States she was 136 about 2 weeks ago, now 126 today. Not on a PPI. Phenergan helps sometimes. No NSAIDs. Occasional GOODY POWDERS. Severe reflux. CONTINUES TO DRINK ALCOHOL.   Feb 2015 141 June 2015 129 Currently: 126   Past Medical History  Diagnosis Date  . Lupus   . 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 Hypertension   . Abscess     soft tissue  . Suicide attempt   . Blood transfusion without reported diagnosis   . Chronic abdominal pain   . Chronic wound infection of abdomen    . Adrenal mass     Past Surgical History  Procedure Laterality Date  . Abdominal surgery    . Abdominal hysterectomy  2013    [place]  . Debridement of abdominal wall abscess N/A 02/08/2013    Procedure: DEBRIDEMENT OF ABDOMINAL WALL ABSCESS;  Surgeon: Jamesetta So, MD;  Location: [facility];  Service: General;  Laterality: N/A;  . Gastric bypass       [place], first 2000, 2005/2006.  . Colonoscopy      in [place]  . Cholecystectomy    . Colonoscopy with propofol N/A 05/20/2013    Dr.Rourk- inadequate prep, normal appearing rectum, grossly normal colon aside from pancolonic diverticula, normal terminal ileum bx= unremarkable colonic mucosa  . Esophagogastroduodenoscopy (egd) with propofol N/A 05/20/2013    Dr.Rourk- s/p prior gastric surgery with normal esophagus, residual gastric mucosa and patent efferent limb  . Esophageal biopsy  05/20/2013    Procedure: BIOPSIES OF ASCENDING AND SIGMOID COLON;  Surgeon: Daneil Dolin, MD;  Location: [facility];  Service: Endoscopy;;  . Wound exploration Right 06/24/2013    Procedure: exploration of traumatic wound right wrist;  Surgeon: Tennis Must, MD;  Location: [place];  Service: Orthopedics;  Laterality: Right;  . Tendon repar    . Adrenal turmor removal      Current Outpatient Prescriptions  Medication Sig Dispense Refill  . ARIPiprazole (ABILIFY) 5 MG tablet Take 1 tablet (5 mg total) by mouth 2 (two) times daily. 60 tablet 2  . escitalopram (LEXAPRO) 20 MG tablet Take 1 tablet (20 mg total) by mouth  daily. 30 tablet 2  . lisinopril (PRINIVIL,ZESTRIL) 10 MG tablet Take 10 mg by mouth daily.    . mupirocin ointment (BACTROBAN) 2 % Place into the nose 2 (two) times daily. 22 g 0  . oxyCODONE-acetaminophen (PERCOCET) 5-325 MG per tablet Take 1-2 tablets by mouth every 4 (four) hours as needed. 25 tablet 0  . promethazine (PHENERGAN) 25 MG tablet Take 1 tablet (25 mg total) by mouth every 6 (six) hours as needed. 20 tablet 0  . ondansetron (ZOFRAN)  4 MG tablet Take 1 tablet (4 mg total) by mouth 3 (three) times daily. With meals 90 tablet 1  . pantoprazole (PROTONIX) 40 MG tablet Take 1 tablet (40 mg total) by mouth 2 (two) times daily before a meal. 60 tablet 3  . sucralfate (CARAFATE) 1 GM/10ML suspension Take 10 mLs (1 g total) by mouth 4 (four) times daily. 420 mL 1   No current facility-administered medications for this visit.    Allergies as of 01/21/2014 - Review Complete 01/21/2014  Allergen Reaction Noted  . Codeine Hives 02/06/2013  . Morphine and related Hives 02/06/2013    Family History  Problem Relation Age of Onset  . Brain cancer Son   . Schizophrenia Son   . Cancer Son     brain  . Breast cancer Maternal Aunt   . Bipolar disorder Maternal Aunt   . Drug abuse Maternal Aunt   . Colon cancer Maternal Grandmother     late 50s, early 27s  . Lung cancer Father   . Cancer Father     mets  . Liver disease Neg Hx   . Drug abuse Mother   . Drug abuse Sister   . Drug abuse Brother   . Bipolar disorder Paternal Grandfather   . Bipolar disorder Cousin     History   Social History  . Marital Status: Married    Spouse Name: N/A    Number of Children: N/A  . Years of Education: N/A   Social History Main Topics  . Smoking status: Current Every Day Smoker -- 0.50 packs/day for 35 years    Types: Cigarettes  . Smokeless tobacco: Never Used  . Alcohol Use: 0.0 oz/week    0 Not specified per week     Comment: as of 11/6: daily but "calmed down", not like I used to. 6 pack of beer daily.   . Drug Use: No     Comment: quit 09/2012  . Sexual Activity: Yes    Birth Control/ Protection: Surgical   Other Topics Concern  . Not on file   Social History Narrative    Review of Systems: As mentioned in HPI  Physical Exam: BP 130/94 mmHg  Pulse 100  Temp(Src) 97.7 F (36.5 C) (Oral)  Ht 5' (1.524 m)  Wt 126 lb (57.153 kg)  BMI 24.61 kg/m2 General:   Alert and oriented. No distress noted. Pleasant and  cooperative.  Head:  Normocephalic and atraumatic. Eyes:  Conjuctiva clear without scleral icterus. Mouth:  Oral mucosa pink and moist. Good dentition. No lesions. Heart:  S1, S2 present without murmurs Abdomen:  +BS, soft, TTP lower abdomen, left of umbilicus, and non-distended. No rebound or guarding. No HSM or masses noted. Msk:  Symmetrical without gross deformities. Normal posture. Extremities:  Without edema. Neurologic:  Alert and  oriented x4;  grossly normal neurologically. Skin:  Intact without significant lesions or rashes. Psych:  Alert and cooperative. Normal mood and affect.  Multiple CTs.  CT  WITH CONTRAST 01/14/14 IMPRESSION: No substantial interval change. Previously described abnormal soft tissue attenuation in the anterior abdominal wall, at the level of the umbilicus and to the left, is not substantially changed. As before this appears to have subtle central low-attenuation raising the question for chronic infection/abscess.  Status post distal gastrectomy and right adrenalectomy. No evidence for bowel obstruction. No acute intraperitoneal abnormality.  Fatty changes in the wall of the colon suggests a history of chronic inflammation. No evidence for colonic wall edema or pericolonic edema/ inflammation to suggest active inflammatory process at this Time.  Lab Results  Component Value Date   ALT 16 01/14/2014   AST 28 01/14/2014   ALKPHOS 87 01/14/2014   BILITOT 0.3 01/14/2014   Lab Results  Component Value Date   CREATININE 0.52 01/14/2014   BUN 7 01/14/2014   NA 144 01/14/2014   K 4.2 01/14/2014   CL 105 01/14/2014   CO2 24 01/14/2014   Lab Results  Component Value Date   WBC 4.9 01/14/2014   HGB 12.9 01/14/2014   HCT 36.8 01/14/2014   MCV 86.2 01/14/2014   PLT 317 01/14/2014

## 2014-02-03 NOTE — Op Note (Signed)
[facility] [address] [place], [address]   ENDOSCOPY PROCEDURE REPORT  PATIENT: Angel Price, Angel Price  MR#: [id number] BIRTHDATE: 12-Jun-1965 , 89  yrs. old GENDER: female ENDOSCOPIST: R.  Garfield Cornea, MD FACP FACG REFERRED BY:  Geroge Baseman, M.D. PROCEDURE DATE:  10-Feb-2014 PROCEDURE:  EGD, diagnostic INDICATIONS:  Nausea and vomiting; history of gastric surgery. MEDICATIONS: Deep sedation provided Mickel Fuchs ASA CLASS:      Class II  CONSENT: The risks, benefits, limitations, alternatives and imponderables have been discussed.  The potential for biopsy, esophogeal dilation, etc. have also been reviewed.  Questions have been answered.  All parties agreeable.  Please see the history and physical in the medical record for more information.  DESCRIPTION OF PROCEDURE: After the risks benefits and alternatives of the procedure were thoroughly explained, informed consent was obtained.  The    endoscope was introduced through the mouth and advanced to the second portion of the duodenum , limited by Without limitations. The instrument was slowly withdrawn as the mucosa was fully examined.    Normal esophagus.  Surgically altered stomach with some retained vegetable matter present in the residual stomach with reported intake of solid food 16 hours prior to this procedure.  The anastomosis with small bowel identified 1 efferent limb identified (reported history of a Billroth II procedure, however, I only found one limb today).  Residual gastric mucosa appeared normal; the inferior limb mucosa appeared normal to a good 20 cm downstream. Small hiatal hernia.  Retroflexed views revealed as previously described.     The scope was then withdrawn from the patient and the procedure completed.  COMPLICATIONS: There were no immediate complications.  ENDOSCOPIC IMPRESSION: Status post hemigastrectomy with retained gastric contents. Residual gastric mucosa and  efferent limb appeared normal otherwise. Query gastroparesis. Seen in ED yesterday for abdominal pain. Plain films labs okay; hemoglobin 12, lipase normal.  RECOMMENDATIONS: Proceed with solid-phase gastric emptying study to evaluate for gastroparesis. Further recommendations to follow. Screening colonoscopy scheduled for March 2016.  REPEAT EXAM:  eSigned:  R. Garfield Cornea, MD Rosalita Chessman [facility] 02/10/14 10:12 AM    CC:  CPT CODES: ICD CODES:  The ICD and CPT codes recommended by this software are interpretations from the data that the clinical staff has captured with the software.  The verification of the translation of this report to the ICD and CPT codes and modifiers is the sole responsibility of the health care institution and practicing physician where this report was generated.  [employer] will not be held responsible for the validity of the ICD and CPT codes included on this report.  AMA assumes no liability for data contained or not contained herein. CPT is a Designer, television/film set of the [employer].  PATIENT NAME:  Angel Price, Angel Price MR#: [id number]

## 2014-02-03 NOTE — Anesthesia Preprocedure Evaluation (Addendum)
Anesthesia Evaluation  Patient identified by MRN, date of birth, ID band Patient awake    Reviewed: Allergy & Precautions, H&P , NPO status , Patient's Chart, lab work & pertinent test results  History of Anesthesia Complications Negative for: history of anesthetic complications  Airway Mallampati: I  TM Distance: >3 FB     Dental  (+) Poor Dentition, Chipped, Missing, Dental Advisory Given,    Pulmonary Current Smoker,  breath sounds clear to auscultation        Cardiovascular hypertension, Pt. on medications Rhythm:Regular Rate:Normal     Neuro/Psych  Headaches, PSYCHIATRIC DISORDERS Anxiety Depression Bipolar Disorder    GI/Hepatic   Endo/Other  Diabetes: s/p  I131 Rx.Hypothyroidism   Renal/GU      Musculoskeletal   Abdominal   Peds  Hematology   Anesthesia Other Findings   Reproductive/Obstetrics                            Anesthesia Physical Anesthesia Plan  ASA: III  Anesthesia Plan: MAC   Post-op Pain Management:    Induction: Intravenous  Airway Management Planned: Simple Face Mask  Additional Equipment:   Intra-op Plan:   Post-operative Plan:   Informed Consent: I have reviewed the patients History and Physical, chart, labs and discussed the procedure including the risks, benefits and alternatives for the proposed anesthesia with the patient or authorized representative who has indicated his/her understanding and acceptance.     Plan Discussed with:   Anesthesia Plan Comments:         Anesthesia Quick Evaluation                                  Anesthesia Evaluation  Patient identified by MRN, date of birth, ID band Patient awake    Reviewed: Allergy & Precautions, H&P , NPO status , Patient's Chart, lab work & pertinent test results  History of Anesthesia Complications Negative for: history of anesthetic complications  Airway Mallampati: I TM  Distance: >3 FB     Dental  (+) Teeth Intact   Pulmonary Current Smoker,  breath sounds clear to auscultation        Cardiovascular hypertension, Pt. on medications Rhythm:Regular Rate:Normal     Neuro/Psych  Headaches, PSYCHIATRIC DISORDERS Bipolar Disorder    GI/Hepatic   Endo/Other  Diabetes: s/p  I131 Rx.Hypothyroidism   Renal/GU      Musculoskeletal   Abdominal   Peds  Hematology   Anesthesia Other Findings   Reproductive/Obstetrics                           Anesthesia Physical Anesthesia Plan  ASA: II  Anesthesia Plan: MAC   Post-op Pain Management:    Induction: Intravenous  Airway Management Planned: Simple Face Mask  Additional Equipment:   Intra-op Plan:   Post-operative Plan:   Informed Consent: I have reviewed the patients History and Physical, chart, labs and discussed the procedure including the risks, benefits and alternatives for the proposed anesthesia with the patient or authorized representative who has indicated his/her understanding and acceptance.     Plan Discussed with:   Anesthesia Plan Comments:         Anesthesia Quick Evaluation

## 2014-02-03 NOTE — Interval H&P Note (Signed)
History and Physical Interval Note:  02/03/2014 8:14 AM  Angel Price  has presented today for surgery, with the diagnosis of hematemesis  The various methods of treatment have been discussed with the patient and family. After consideration of risks, benefits and other options for treatment, the patient has consented to  Procedure(s) with comments: ESOPHAGOGASTRODUODENOSCOPY (EGD) WITH PROPOFOL (N/A) - 1230 - moved to 9:15 as a surgical intervention .  The patient's history has been reviewed, patient examined, no change in status, stable for surgery.  I have reviewed the patient's chart and labs.  Questions were answered to the patient's satisfaction.     Angel Price  No change. Plan for EGD and colonoscopy today per plan.rmr.

## 2014-02-04 ENCOUNTER — Encounter (HOSPITAL_COMMUNITY): Payer: Self-pay | Admitting: Internal Medicine

## 2014-02-09 ENCOUNTER — Encounter (HOSPITAL_COMMUNITY): Payer: Self-pay

## 2014-02-09 ENCOUNTER — Encounter (HOSPITAL_COMMUNITY)
Admission: RE | Admit: 2014-02-09 | Discharge: 2014-02-09 | Disposition: A | Discharge status: Home / Self Care | Payer: Medicare Other | Source: Ambulatory Visit | Attending: Internal Medicine | Admitting: Internal Medicine

## 2014-02-09 DIAGNOSIS — R112 Nausea with vomiting, unspecified: Secondary | ICD-10-CM | POA: Diagnosis not present

## 2014-02-09 DIAGNOSIS — K3184 Gastroparesis: Secondary | ICD-10-CM

## 2014-02-09 MED ORDER — TECHNETIUM TC 99M SULFUR COLLOID: 2.0000 | Freq: Once | INTRAVENOUS | Status: AC | PRN

## 2014-02-09 NOTE — Progress Notes (Signed)
cc'ed to pcp °

## 2014-02-17 ENCOUNTER — Telehealth: Payer: Self-pay | Admitting: Internal Medicine

## 2014-02-17 NOTE — Telephone Encounter (Signed)
I talked to the pt and gave her the results.

## 2014-02-17 NOTE — Telephone Encounter (Signed)
Patient was calling to see if her results on her swallowing test was back. Please advise. [phone]

## 2014-02-18 ENCOUNTER — Encounter: Payer: Self-pay | Admitting: Internal Medicine

## 2014-02-18 NOTE — Progress Notes (Signed)
APPT MADE AND LETTER SENT  °

## 2014-03-02 ENCOUNTER — Encounter (HOSPITAL_COMMUNITY): Payer: Self-pay

## 2014-03-02 ENCOUNTER — Emergency Department (HOSPITAL_COMMUNITY): Payer: Medicare Other

## 2014-03-02 ENCOUNTER — Emergency Department (HOSPITAL_COMMUNITY)
Admission: EM | Admit: 2014-03-02 | Discharge: 2014-03-03 | Disposition: A | Discharge status: Home / Self Care | Payer: Medicare Other | Attending: Emergency Medicine | Admitting: Emergency Medicine

## 2014-03-02 DIAGNOSIS — F329 Major depressive disorder, single episode, unspecified: Secondary | ICD-10-CM | POA: Insufficient documentation

## 2014-03-02 DIAGNOSIS — K5900 Constipation, unspecified: Secondary | ICD-10-CM | POA: Insufficient documentation

## 2014-03-02 DIAGNOSIS — D3500 Benign neoplasm of unspecified adrenal gland: Secondary | ICD-10-CM | POA: Diagnosis not present

## 2014-03-02 DIAGNOSIS — R1084 Generalized abdominal pain: Secondary | ICD-10-CM

## 2014-03-02 DIAGNOSIS — Z720 Tobacco use: Secondary | ICD-10-CM | POA: Insufficient documentation

## 2014-03-02 DIAGNOSIS — F419 Anxiety disorder, unspecified: Secondary | ICD-10-CM | POA: Insufficient documentation

## 2014-03-02 DIAGNOSIS — Y929 Unspecified place or not applicable: Secondary | ICD-10-CM | POA: Insufficient documentation

## 2014-03-02 DIAGNOSIS — F10120 Alcohol abuse with intoxication, uncomplicated: Secondary | ICD-10-CM | POA: Insufficient documentation

## 2014-03-02 DIAGNOSIS — Z9089 Acquired absence of other organs: Secondary | ICD-10-CM | POA: Insufficient documentation

## 2014-03-02 DIAGNOSIS — Z862 Personal history of diseases of the blood and blood-forming organs and certain disorders involving the immune mechanism: Secondary | ICD-10-CM | POA: Insufficient documentation

## 2014-03-02 DIAGNOSIS — E079 Disorder of thyroid, unspecified: Secondary | ICD-10-CM | POA: Insufficient documentation

## 2014-03-02 DIAGNOSIS — Z79899 Other long term (current) drug therapy: Secondary | ICD-10-CM | POA: Insufficient documentation

## 2014-03-02 DIAGNOSIS — Z7952 Long term (current) use of systemic steroids: Secondary | ICD-10-CM | POA: Insufficient documentation

## 2014-03-02 DIAGNOSIS — Z872 Personal history of diseases of the skin and subcutaneous tissue: Secondary | ICD-10-CM | POA: Insufficient documentation

## 2014-03-02 DIAGNOSIS — R079 Chest pain, unspecified: Secondary | ICD-10-CM | POA: Diagnosis not present

## 2014-03-02 DIAGNOSIS — R0602 Shortness of breath: Secondary | ICD-10-CM | POA: Diagnosis not present

## 2014-03-02 DIAGNOSIS — S21111A Laceration without foreign body of right front wall of thorax without penetration into thoracic cavity, initial encounter: Secondary | ICD-10-CM

## 2014-03-02 DIAGNOSIS — F1020 Alcohol dependence, uncomplicated: Secondary | ICD-10-CM

## 2014-03-02 DIAGNOSIS — Z9889 Other specified postprocedural states: Secondary | ICD-10-CM | POA: Insufficient documentation

## 2014-03-02 DIAGNOSIS — Y9389 Activity, other specified: Secondary | ICD-10-CM | POA: Insufficient documentation

## 2014-03-02 DIAGNOSIS — S3991XA Unspecified injury of abdomen, initial encounter: Secondary | ICD-10-CM | POA: Insufficient documentation

## 2014-03-02 DIAGNOSIS — R911 Solitary pulmonary nodule: Secondary | ICD-10-CM | POA: Diagnosis not present

## 2014-03-02 DIAGNOSIS — Y998 Other external cause status: Secondary | ICD-10-CM | POA: Insufficient documentation

## 2014-03-02 DIAGNOSIS — Z8739 Personal history of other diseases of the musculoskeletal system and connective tissue: Secondary | ICD-10-CM | POA: Insufficient documentation

## 2014-03-02 DIAGNOSIS — I10 Essential (primary) hypertension: Secondary | ICD-10-CM | POA: Insufficient documentation

## 2014-03-02 DIAGNOSIS — S21139A Puncture wound without foreign body of unspecified front wall of thorax without penetration into thoracic cavity, initial encounter: Secondary | ICD-10-CM | POA: Insufficient documentation

## 2014-03-02 DIAGNOSIS — J984 Other disorders of lung: Secondary | ICD-10-CM | POA: Diagnosis not present

## 2014-03-02 DIAGNOSIS — F10929 Alcohol use, unspecified with intoxication, unspecified: Secondary | ICD-10-CM

## 2014-03-02 LAB — CBC WITH DIFFERENTIAL/PLATELET
Basophils Absolute: 0 10*3/uL (ref 0.0–0.1)
Basophils Relative: 0 % (ref 0–1)
EOS PCT: 1 % (ref 0–5)
Eosinophils Absolute: 0 10*3/uL (ref 0.0–0.7)
HCT: 39.9 % (ref 36.0–46.0)
HEMOGLOBIN: 14.2 g/dL (ref 12.0–15.0)
LYMPHS ABS: 2.9 10*3/uL (ref 0.7–4.0)
LYMPHS PCT: 41 % (ref 12–46)
MCH: 30.9 pg (ref 26.0–34.0)
MCHC: 35.6 g/dL (ref 30.0–36.0)
MCV: 86.7 fL (ref 78.0–100.0)
MONOS PCT: 6 % (ref 3–12)
Monocytes Absolute: 0.4 10*3/uL (ref 0.1–1.0)
Neutro Abs: 3.6 10*3/uL (ref 1.7–7.7)
Neutrophils Relative %: 52 % (ref 43–77)
PLATELETS: 298 10*3/uL (ref 150–400)
RBC: 4.6 MIL/uL (ref 3.87–5.11)
RDW: 15.9 % — ABNORMAL HIGH (ref 11.5–15.5)
WBC: 6.9 10*3/uL (ref 4.0–10.5)

## 2014-03-02 LAB — COMPREHENSIVE METABOLIC PANEL
ALBUMIN: 3.8 g/dL (ref 3.5–5.2)
ALT: 42 U/L — AB (ref 0–35)
AST: 41 U/L — ABNORMAL HIGH (ref 0–37)
Alkaline Phosphatase: 86 U/L (ref 39–117)
Anion gap: 18 — ABNORMAL HIGH (ref 5–15)
BUN: 9 mg/dL (ref 6–23)
CALCIUM: 9.5 mg/dL (ref 8.4–10.5)
CO2: 23 mEq/L (ref 19–32)
Chloride: 100 mEq/L (ref 96–112)
Creatinine, Ser: 0.4 mg/dL — ABNORMAL LOW (ref 0.50–1.10)
GFR calc Af Amer: >90 mL/min (ref >90)
GFR calc non Af Amer: >90 mL/min (ref >90)
GLUCOSE: 83 mg/dL (ref 70–99)
POTASSIUM: 3.9 meq/L (ref 3.7–5.3)
SODIUM: 141 meq/L (ref 137–147)
TOTAL PROTEIN: 8.1 g/dL (ref 6.0–8.3)
Total Bilirubin: 0.3 mg/dL (ref 0.3–1.2)

## 2014-03-02 LAB — URINE MICROSCOPIC-ADD ON

## 2014-03-02 LAB — URINALYSIS, ROUTINE W REFLEX MICROSCOPIC
Bilirubin Urine: NEGATIVE
Glucose, UA: NEGATIVE mg/dL
Ketones, ur: NEGATIVE mg/dL
Nitrite: NEGATIVE
PROTEIN: NEGATIVE mg/dL
Specific Gravity, Urine: 1.01 (ref 1.005–1.030)
UROBILINOGEN UA: 0.2 mg/dL (ref 0.0–1.0)
pH: 5 (ref 5.0–8.0)

## 2014-03-02 LAB — TYPE AND SCREEN
ABO/RH(D): O POS
Antibody Screen: NEGATIVE

## 2014-03-02 LAB — ETHANOL: Alcohol, Ethyl (B): 340 mg/dL — ABNORMAL HIGH (ref 0–11)

## 2014-03-02 MED ORDER — ACETAMINOPHEN 325 MG PO TABS
650.0000 mg | ORAL_TABLET | Freq: Once | ORAL | Status: AC
  Administered 2014-03-02: 650 mg via ORAL

## 2014-03-02 MED ORDER — SODIUM CHLORIDE 0.9 % IV BOLUS (SEPSIS)
500.0000 mL | Freq: Once | INTRAVENOUS | Status: AC
  Administered 2014-03-02: 500 mL via INTRAVENOUS

## 2014-03-02 MED ORDER — ACETAMINOPHEN 325 MG PO TABS
ORAL_TABLET | ORAL | Status: AC
  Filled 2014-03-02: qty 2

## 2014-03-02 MED ORDER — IOHEXOL 300 MG/ML  SOLN
100.0000 mL | Freq: Once | INTRAMUSCULAR | Status: AC | PRN
  Administered 2014-03-02: 100 mL via INTRAVENOUS

## 2014-03-02 MED ORDER — IOHEXOL 300 MG/ML  SOLN
25.0000 mL | Freq: Once | INTRAMUSCULAR | Status: AC | PRN
  Administered 2014-03-02: 25 mL via ORAL
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
## 2014-03-02 NOTE — ED Notes (Signed)
Wounds to right breast cleaned and bandaged. Patient tolerated well.

## 2014-03-02 NOTE — ED Notes (Signed)
Patient requested more pain medication. MD aware and will not allow patient to have anything else for pain due to her alcohol level. Family in room. Asked patient if I could speak freely in front of her adult children, after she said yes, patient was informed that she could not have anything else for pain and why.

## 2014-03-02 NOTE — Discharge Instructions (Signed)
It is important to stop drinking all forms of alcohol. Keep the wounds on the right breast clean with soap and water, twice a day. Apply a Band-Aid to the wounds, until healed. You will need a repeat CAT scan of the chest, and 2 or 3 weeks to be evaluated for inflammation or scarring.  There is a small chance that this abnormality could represent cancer. Follow-up with a primary care doctor, in the next week or 2, to arrange for ongoing care and treatment. Return here, if needed, for problems such as severe chest pain, shortness of breath, weakness or dizziness.    Puncture Wound A puncture wound is an injury that extends through all layers of the skin and into the tissue beneath the skin (subcutaneous tissue). Puncture wounds become infected easily because germs often enter the body and go beneath the skin during the injury. Having a deep wound with a small entrance point makes it difficult for your caregiver to adequately clean the wound. This is especially true if you have stepped on a nail and it has passed through a dirty shoe or other situations where the wound is obviously contaminated. CAUSES  Many puncture wounds involve glass, nails, splinters, fish hooks, or other objects that enter the skin (foreign bodies). A puncture wound may also be caused by a human bite or animal bite. DIAGNOSIS  A puncture wound is usually diagnosed by your history and a physical exam. You may need to have an X-ray or an ultrasound to check for any foreign bodies still in the wound. TREATMENT   Your caregiver will clean the wound as thoroughly as possible. Depending on the location of the wound, a bandage (dressing) may be applied.  Your caregiver might prescribe antibiotic medicines.  You may need a follow-up visit to check on your wound. Follow all instructions as directed by your caregiver. HOME CARE INSTRUCTIONS   Change your dressing once per day, or as directed by your caregiver. If the dressing  sticks, it may be removed by soaking the area in water.  If your caregiver has given you follow-up instructions, it is very important that you return for a follow-up appointment. Not following up as directed could result in a chronic or permanent injury, pain, and disability.  Only take over-the-counter or prescription medicines for pain, discomfort, or fever as directed by your caregiver.  If you are given antibiotics, take them as directed. Finish them even if you start to feel better. You may need a tetanus shot if:  You cannot remember when you had your last tetanus shot.  You have never had a tetanus shot. If you got a tetanus shot, your arm may swell, get red, and feel warm to the touch. This is common and not a problem. If you need a tetanus shot and you choose not to have one, there is a rare chance of getting tetanus. Sickness from tetanus can be serious. You may need a rabies shot if an animal bite caused your puncture wound. SEEK MEDICAL CARE IF:   You have redness, swelling, or increasing pain in the wound.  You have red streaks going away from the wound.  You notice a bad smell coming from the wound or dressing.  You have yellowish-white fluid (pus) coming from the wound.  You are treated with an antibiotic for infection, but the infection is not getting better.  You notice something in the wound, such as rubber from your shoe, cloth, or another object.  You have  a fever.  You have severe pain.  You have difficulty breathing.  You feel dizzy or faint.  You cannot stop vomiting.  You lose feeling, develop numbness, or cannot move a limb below the wound.  Your symptoms worsen. MAKE SURE YOU:  Understand these instructions.  Will watch your condition.  Will get help right away if you are not doing well or get worse. Document Released: 12/12/2004 Document Revised: 05/27/2011 Document Reviewed: 08/21/2010 [facility] Patient Information 2015 [place], [place]. This  information is not intended to replace advice given to you by your health care provider. Make sure you discuss any questions you have with your health care provider.  Abdominal Pain, Women Abdominal (stomach, pelvic, or belly) pain can be caused by many things. It is important to tell your doctor:  The location of the pain.  Does it come and go or is it present all the time?  Are there things that start the pain (eating certain foods, exercise)?  Are there other symptoms associated with the pain (fever, nausea, vomiting, diarrhea)? All of this is helpful to know when trying to find the cause of the pain. CAUSES   Stomach: virus or bacteria infection, or ulcer.  Intestine: appendicitis (inflamed appendix), regional ileitis (Crohn's disease), ulcerative colitis (inflamed colon), irritable bowel syndrome, diverticulitis (inflamed diverticulum of the colon), or cancer of the stomach or intestine.  Gallbladder disease or stones in the gallbladder.  Kidney disease, kidney stones, or infection.  Pancreas infection or cancer.  Fibromyalgia (pain disorder).  Diseases of the female organs:  Uterus: fibroid (non-cancerous) tumors or infection.  Fallopian tubes: infection or tubal pregnancy.  Ovary: cysts or tumors.  Pelvic adhesions (scar tissue).  Endometriosis (uterus lining tissue growing in the pelvis and on the pelvic organs).  Pelvic congestion syndrome (female organs filling up with blood just before the menstrual period).  Pain with the menstrual period.  Pain with ovulation (producing an egg).  Pain with an IUD (intrauterine device, birth control) in the uterus.  Cancer of the female organs.  Functional pain (pain not caused by a disease, may improve without treatment).  Psychological pain.  Depression. DIAGNOSIS  Your doctor will decide the seriousness of your pain by doing an examination.  Blood tests.  X-rays.  Ultrasound.  CT scan (computed tomography,  special type of X-ray).  MRI (magnetic resonance imaging).  Cultures, for infection.  Barium enema (dye inserted in the large intestine, to better view it with X-rays).  Colonoscopy (looking in intestine with a lighted tube).  Laparoscopy (minor surgery, looking in abdomen with a lighted tube).  Major abdominal exploratory surgery (looking in abdomen with a large incision). TREATMENT  The treatment will depend on the cause of the pain.   Many cases can be observed and treated at home.  Over-the-counter medicines recommended by your caregiver.  Prescription medicine.  Antibiotics, for infection.  Birth control pills, for painful periods or for ovulation pain.  Hormone treatment, for endometriosis.  Nerve blocking injections.  Physical therapy.  Antidepressants.  Counseling with a psychologist or psychiatrist.  Minor or major surgery. HOME CARE INSTRUCTIONS   Do not take laxatives, unless directed by your caregiver.  Take over-the-counter pain medicine only if ordered by your caregiver. Do not take aspirin because it can cause an upset stomach or bleeding.  Try a clear liquid diet (broth or water) as ordered by your caregiver. Slowly move to a bland diet, as tolerated, if the pain is related to the stomach or intestine.  Have a thermometer and take your temperature several times a day, and record it.  Bed rest and sleep, if it helps the pain.  Avoid sexual intercourse, if it causes pain.  Avoid stressful situations.  Keep your follow-up appointments and tests, as your caregiver orders.  If the pain does not go away with medicine or surgery, you may try:  Acupuncture.  Relaxation exercises (yoga, meditation).  Group therapy.  Counseling. SEEK MEDICAL CARE IF:   You notice certain foods cause stomach pain.  Your home care treatment is not helping your pain.  You need stronger pain medicine.  You want your IUD removed.  You feel faint or  lightheaded.  You develop nausea and vomiting.  You develop a rash.  You are having side effects or an allergy to your medicine. SEEK IMMEDIATE MEDICAL CARE IF:   Your pain does not go away or gets worse.  You have a fever.  Your pain is felt only in portions of the abdomen. The right side could possibly be appendicitis. The left lower portion of the abdomen could be colitis or diverticulitis.  You are passing blood in your stools (bright red or black tarry stools, with or without vomiting).  You have blood in your urine.  You develop chills, with or without a fever.  You pass out. MAKE SURE YOU:   Understand these instructions.  Will watch your condition.  Will get help right away if you are not doing well or get worse. Document Released: 12/30/2006 Document Revised: 07/19/2013 Document Reviewed: 01/19/2009 [facility] Patient Information 2015 [place], [place]. This information is not intended to replace advice given to you by your health care provider. Make sure you discuss any questions you have with your health care provider.  Finding Treatment for Alcohol and Drug Addiction It can be hard to find the right place to get professional treatment. Here are some important things to consider:  There are different types of treatment to choose from.  Some programs are live-in (residential) while others are not (outpatient). Sometimes a combination is offered.  No single type of program is right for everyone.  Most treatment programs involve a combination of education, counseling, and a 12-step, spiritually-based approach.  There are non-spiritually based programs (not 12-step).  Some treatment programs are government sponsored. They are geared for patients without private insurance.  Treatment programs can vary in many respects such as:  Cost and types of insurance accepted.  Types of on-site medical services offered.  Length of stay, setting, and size.  Overall  philosophy of treatment. A person may need specialized treatment or have needs not addressed by all programs. For example, adolescents need treatment appropriate for their age. Other people have secondary disorders that must be managed as well. Secondary conditions can include mental illness, such as depression or diabetes. Often, a period of detoxification from alcohol or drugs is needed. This requires medical supervision and not all programs offer this. THINGS TO CONSIDER WHEN SELECTING A TREATMENT PROGRAM   Is the program certified by the appropriate government agency? Even private programs must be certified and employ certified professionals.  Does the program accept your insurance? If not, can a payment plan be set up?  Is the facility clean, organized, and well run? Do they allow you to speak with graduates who can share their treatment experience with you? Can you tour the facility? Can you meet with staff?  Does the program meet the full range of individual needs?  Does the treatment  program address sexual orientation and physical disabilities? Do they provide age, gender, and culturally appropriate treatment services?  Is treatment available in languages other than English?  Is long-term aftercare support or guidance encouraged and provided?  Is assessment of an individual's treatment plan ongoing to ensure it meets changing needs?  Does the program use strategies to encourage reluctant patients to remain in treatment long enough to increase the likelihood of success?  Does the program offer counseling (individual or group) and other behavioral therapies?  Does the program offer medicine as part of the treatment regimen, if needed?  Is there ongoing monitoring of possible relapse? Is there a defined relapse prevention program? Are services or referrals offered to family members to ensure they understand addiction and the recovery process? This would help them support the  recovering individual.  Are 12-step meetings held at the center or is transport available for patients to attend outside meetings? In countries outside of the [place] and [place], [employer] for contact information for services in your area. Document Released: 01/31/2005 Document Revised: 05/27/2011 Document Reviewed: 08/13/2007 [facility] Patient Information 2015 [place], [place]. This information is not intended to replace advice given to you by your health care provider. Make sure you discuss any questions you have with your health care provider.  Alcohol Intoxication Alcohol intoxication occurs when you drink enough alcohol that it affects your ability to function. It can be mild or very severe. Drinking a lot of alcohol in a short time is called binge drinking. This can be very harmful. Drinking alcohol can also be more dangerous if you are taking medicines or other drugs. Some of the effects caused by alcohol may include:  Loss of coordination.  Changes in mood and behavior.  Unclear thinking.  Trouble talking (slurred speech).  Throwing up (vomiting).  Confusion.  Slowed breathing.  Twitching and shaking (seizures).  Loss of consciousness. HOME CARE  Do not drive after drinking alcohol.  Drink enough water and fluids to keep your pee (urine) clear or pale yellow. Avoid caffeine.  Only take medicine as told by your doctor. GET HELP IF:  You throw up (vomit) many times.  You do not feel better after a few days.  You frequently have alcohol intoxication. Your doctor can help decide if you should see a substance use treatment counselor. GET HELP RIGHT AWAY IF:  You become shaky when you stop drinking.  You have twitching and shaking.  You throw up blood. It may look bright red or like coffee grounds.  You notice blood in your poop (bowel movements).  You become lightheaded or pass out (faint). MAKE SURE YOU:   Understand these instructions.  Will watch  your condition.  Will get help right away if you are not doing well or get worse. Document Released: 08/21/2007 Document Revised: 11/04/2012 Document Reviewed: 08/07/2012 [facility] Patient Information 2015 [place], [place]. This information is not intended to replace advice given to you by your health care provider. Make sure you discuss any questions you have with your health care provider.  Laceration Care, Adult A laceration is a cut or lesion that goes through all layers of the skin and into the tissue just beneath the skin. TREATMENT  Some lacerations may not require closure. Some lacerations may not be able to be closed due to an increased risk of infection. It is important to see your caregiver as soon as possible after an injury to minimize the risk of infection and maximize the opportunity for successful  closure. If closure is appropriate, pain medicines may be given, if needed. The wound will be cleaned to help prevent infection. Your caregiver will use stitches (sutures), staples, wound glue (adhesive), or skin adhesive strips to repair the laceration. These tools bring the skin edges together to allow for faster healing and a better cosmetic outcome. However, all wounds will heal with a scar. Once the wound has healed, scarring can be minimized by covering the wound with sunscreen during the day for 1 full year. HOME CARE INSTRUCTIONS  For sutures or staples:  Keep the wound clean and dry.  If you were given a bandage (dressing), you should change it at least once a day. Also, change the dressing if it becomes wet or dirty, or as directed by your caregiver.  Wash the wound with soap and water 2 times a day. Rinse the wound off with water to remove all soap. Pat the wound dry with a clean towel.  After cleaning, apply a thin layer of the antibiotic ointment as recommended by your caregiver. This will help prevent infection and keep the dressing from sticking.  You may shower as usual  after the first 24 hours. Do not soak the wound in water until the sutures are removed.  Only take over-the-counter or prescription medicines for pain, discomfort, or fever as directed by your caregiver.  Get your sutures or staples removed as directed by your caregiver. For skin adhesive strips:  Keep the wound clean and dry.  Do not get the skin adhesive strips wet. You may bathe carefully, using caution to keep the wound dry.  If the wound gets wet, pat it dry with a clean towel.  Skin adhesive strips will fall off on their own. You may trim the strips as the wound heals. Do not remove skin adhesive strips that are still stuck to the wound. They will fall off in time. For wound adhesive:  You may briefly wet your wound in the shower or bath. Do not soak or scrub the wound. Do not swim. Avoid periods of heavy perspiration until the skin adhesive has fallen off on its own. After showering or bathing, gently pat the wound dry with a clean towel.  Do not apply liquid medicine, cream medicine, or ointment medicine to your wound while the skin adhesive is in place. This may loosen the film before your wound is healed.  If a dressing is placed over the wound, be careful not to apply tape directly over the skin adhesive. This may cause the adhesive to be pulled off before the wound is healed.  Avoid prolonged exposure to sunlight or tanning lamps while the skin adhesive is in place. Exposure to ultraviolet light in the first year will darken the scar.  The skin adhesive will usually remain in place for 5 to 10 days, then naturally fall off the skin. Do not pick at the adhesive film. You may need a tetanus shot if:  You cannot remember when you had your last tetanus shot.  You have never had a tetanus shot. If you get a tetanus shot, your arm may swell, get red, and feel warm to the touch. This is common and not a problem. If you need a tetanus shot and you choose not to have one, there is a  rare chance of getting tetanus. Sickness from tetanus can be serious. SEEK MEDICAL CARE IF:   You have redness, swelling, or increasing pain in the wound.  You see a red line  that goes away from the wound.  You have yellowish-white fluid (pus) coming from the wound.  You have a fever.  You notice a bad smell coming from the wound or dressing.  Your wound breaks open before or after sutures have been removed.  You notice something coming out of the wound such as wood or glass.  Your wound is on your hand or foot and you cannot move a finger or toe. SEEK IMMEDIATE MEDICAL CARE IF:   Your pain is not controlled with prescribed medicine.  You have severe swelling around the wound causing pain and numbness or a change in color in your arm, hand, leg, or foot.  Your wound splits open and starts bleeding.  You have worsening numbness, weakness, or loss of function of any joint around or beyond the wound.  You develop painful lumps near the wound or on the skin anywhere on your body. MAKE SURE YOU:   Understand these instructions.  Will watch your condition.  Will get help right away if you are not doing well or get worse. Document Released: 03/04/2005 Document Revised: 05/27/2011 Document Reviewed: 08/28/2010 [facility] Patient Information 2015 [place], [place]. This information is not intended to replace advice given to you by your health care provider. Make sure you discuss any questions you have with your health care provider.

## 2014-03-14 ENCOUNTER — Emergency Department (HOSPITAL_COMMUNITY)
Admission: EM | Admit: 2014-03-14 | Discharge: 2014-03-14 | Disposition: A | Discharge status: Home / Self Care | Payer: Medicare Other | Attending: Emergency Medicine | Admitting: Emergency Medicine

## 2014-03-14 ENCOUNTER — Encounter (HOSPITAL_COMMUNITY): Payer: Self-pay

## 2014-03-14 ENCOUNTER — Emergency Department (HOSPITAL_COMMUNITY): Payer: Medicare Other

## 2014-03-14 DIAGNOSIS — I10 Essential (primary) hypertension: Secondary | ICD-10-CM | POA: Insufficient documentation

## 2014-03-14 DIAGNOSIS — Z792 Long term (current) use of antibiotics: Secondary | ICD-10-CM | POA: Insufficient documentation

## 2014-03-14 DIAGNOSIS — F329 Major depressive disorder, single episode, unspecified: Secondary | ICD-10-CM | POA: Insufficient documentation

## 2014-03-14 DIAGNOSIS — E079 Disorder of thyroid, unspecified: Secondary | ICD-10-CM | POA: Insufficient documentation

## 2014-03-14 DIAGNOSIS — R918 Other nonspecific abnormal finding of lung field: Secondary | ICD-10-CM | POA: Diagnosis not present

## 2014-03-14 DIAGNOSIS — R062 Wheezing: Secondary | ICD-10-CM | POA: Diagnosis not present

## 2014-03-14 DIAGNOSIS — R0789 Other chest pain: Secondary | ICD-10-CM | POA: Insufficient documentation

## 2014-03-14 DIAGNOSIS — Z862 Personal history of diseases of the blood and blood-forming organs and certain disorders involving the immune mechanism: Secondary | ICD-10-CM | POA: Insufficient documentation

## 2014-03-14 DIAGNOSIS — Z79899 Other long term (current) drug therapy: Secondary | ICD-10-CM | POA: Insufficient documentation

## 2014-03-14 DIAGNOSIS — R109 Unspecified abdominal pain: Secondary | ICD-10-CM | POA: Insufficient documentation

## 2014-03-14 DIAGNOSIS — R000 Tachycardia, unspecified: Secondary | ICD-10-CM | POA: Insufficient documentation

## 2014-03-14 DIAGNOSIS — R0689 Other abnormalities of breathing: Secondary | ICD-10-CM | POA: Diagnosis not present

## 2014-03-14 DIAGNOSIS — F419 Anxiety disorder, unspecified: Secondary | ICD-10-CM | POA: Diagnosis not present

## 2014-03-14 DIAGNOSIS — J690 Pneumonitis due to inhalation of food and vomit: Secondary | ICD-10-CM | POA: Diagnosis not present

## 2014-03-14 DIAGNOSIS — Z872 Personal history of diseases of the skin and subcutaneous tissue: Secondary | ICD-10-CM | POA: Diagnosis not present

## 2014-03-14 DIAGNOSIS — Z7952 Long term (current) use of systemic steroids: Secondary | ICD-10-CM | POA: Insufficient documentation

## 2014-03-14 DIAGNOSIS — R079 Chest pain, unspecified: Secondary | ICD-10-CM

## 2014-03-14 DIAGNOSIS — Z720 Tobacco use: Secondary | ICD-10-CM | POA: Insufficient documentation

## 2014-03-14 DIAGNOSIS — R064 Hyperventilation: Secondary | ICD-10-CM | POA: Diagnosis not present

## 2014-03-14 DIAGNOSIS — R0602 Shortness of breath: Secondary | ICD-10-CM | POA: Insufficient documentation

## 2014-03-14 DIAGNOSIS — R05 Cough: Secondary | ICD-10-CM | POA: Diagnosis present

## 2014-03-14 DIAGNOSIS — G8929 Other chronic pain: Secondary | ICD-10-CM | POA: Diagnosis not present

## 2014-03-14 DIAGNOSIS — K219 Gastro-esophageal reflux disease without esophagitis: Secondary | ICD-10-CM | POA: Insufficient documentation

## 2014-03-14 DIAGNOSIS — J9811 Atelectasis: Secondary | ICD-10-CM | POA: Diagnosis not present

## 2014-03-14 LAB — CBC WITH DIFFERENTIAL/PLATELET
BASOS ABS: 0 10*3/uL (ref 0.0–0.1)
Basophils Relative: 0 % (ref 0–1)
EOS PCT: 1 % (ref 0–5)
Eosinophils Absolute: 0.1 10*3/uL (ref 0.0–0.7)
HEMATOCRIT: 33.9 % — AB (ref 36.0–46.0)
Hemoglobin: 11.8 g/dL — ABNORMAL LOW (ref 12.0–15.0)
Lymphocytes Relative: 61 % — ABNORMAL HIGH (ref 12–46)
Lymphs Abs: 3 10*3/uL (ref 0.7–4.0)
MCH: 30.1 pg (ref 26.0–34.0)
MCHC: 34.8 g/dL (ref 30.0–36.0)
MCV: 86.5 fL (ref 78.0–100.0)
Monocytes Absolute: 0.3 10*3/uL (ref 0.1–1.0)
Monocytes Relative: 6 % (ref 3–12)
Neutro Abs: 1.5 10*3/uL — ABNORMAL LOW (ref 1.7–7.7)
Neutrophils Relative %: 32 % — ABNORMAL LOW (ref 43–77)
Platelets: 273 10*3/uL (ref 150–400)
RBC: 3.92 MIL/uL (ref 3.87–5.11)
RDW: 15.5 % (ref 11.5–15.5)
WBC: 4.9 10*3/uL (ref 4.0–10.5)

## 2014-03-14 LAB — COMPREHENSIVE METABOLIC PANEL
ALT: 28 U/L (ref 0–35)
AST: 30 U/L (ref 0–37)
Albumin: 3.7 g/dL (ref 3.5–5.2)
Alkaline Phosphatase: 68 U/L (ref 39–117)
Anion gap: 9 (ref 5–15)
BUN: 7 mg/dL (ref 6–23)
CALCIUM: 8.7 mg/dL (ref 8.4–10.5)
CO2: 23 mmol/L (ref 19–32)
CREATININE: 0.52 mg/dL (ref 0.50–1.10)
Chloride: 109 mEq/L (ref 96–112)
GFR calc Af Amer: >90 mL/min (ref >90)
GFR calc non Af Amer: >90 mL/min (ref >90)
Glucose, Bld: 90 mg/dL (ref 70–99)
Potassium: 3.7 mmol/L (ref 3.5–5.1)
Sodium: 141 mmol/L (ref 135–145)
Total Bilirubin: 0.4 mg/dL (ref 0.3–1.2)
Total Protein: 6.7 g/dL (ref 6.0–8.3)

## 2014-03-14 LAB — URINALYSIS, ROUTINE W REFLEX MICROSCOPIC
Bilirubin Urine: NEGATIVE
Glucose, UA: NEGATIVE mg/dL
HGB URINE DIPSTICK: NEGATIVE
KETONES UR: NEGATIVE mg/dL
Leukocytes, UA: NEGATIVE
Nitrite: NEGATIVE
PROTEIN: NEGATIVE mg/dL
Specific Gravity, Urine: 1.015 (ref 1.005–1.030)
UROBILINOGEN UA: 0.2 mg/dL (ref 0.0–1.0)
pH: 5 (ref 5.0–8.0)

## 2014-03-14 LAB — ETHANOL: ALCOHOL ETHYL (B): 266 mg/dL — AB (ref 0–9)

## 2014-03-14 LAB — RAPID URINE DRUG SCREEN, HOSP PERFORMED
Amphetamines: NOT DETECTED
Barbiturates: NOT DETECTED
Benzodiazepines: NOT DETECTED
Cocaine: NOT DETECTED
OPIATES: NOT DETECTED
Tetrahydrocannabinol: NOT DETECTED

## 2014-03-14 LAB — TROPONIN I: Troponin I: <0.03 ng/mL (ref <0.031)

## 2014-03-14 LAB — POC OCCULT BLOOD, ED: Fecal Occult Bld: NEGATIVE

## 2014-03-14 LAB — LIPASE, BLOOD: LIPASE: 26 U/L (ref 11–59)

## 2014-03-14 LAB — BRAIN NATRIURETIC PEPTIDE: B Natriuretic Peptide: 13 pg/mL (ref 0.0–100.0)

## 2014-03-14 MED ORDER — LEVOFLOXACIN 750 MG PO TABS
750.0000 mg | ORAL_TABLET | Freq: Once | ORAL | Status: AC
  Administered 2014-03-14: 750 mg via ORAL
  Filled 2014-03-14: qty 1

## 2014-03-14 MED ORDER — ALBUTEROL SULFATE (2.5 MG/3ML) 0.083% IN NEBU: 5.0000 mg | INHALATION_SOLUTION | Freq: Once | RESPIRATORY_TRACT | Status: DC

## 2014-03-14 MED ORDER — IPRATROPIUM-ALBUTEROL 0.5-2.5 (3) MG/3ML IN SOLN
3.0000 mL | Freq: Once | RESPIRATORY_TRACT | Status: AC
  Administered 2014-03-14: 3 mL via RESPIRATORY_TRACT
  Filled 2014-03-14: qty 3

## 2014-03-14 MED ORDER — ALBUTEROL SULFATE (2.5 MG/3ML) 0.083% IN NEBU
2.5000 mg | INHALATION_SOLUTION | Freq: Once | RESPIRATORY_TRACT | Status: AC
  Administered 2014-03-14: 2.5 mg via RESPIRATORY_TRACT
  Filled 2014-03-14: qty 3

## 2014-03-14 MED ORDER — SODIUM CHLORIDE 0.9 % IV SOLN
80.0000 mg | Freq: Once | INTRAVENOUS | Status: AC
  Administered 2014-03-14: 80 mg via INTRAVENOUS
  Filled 2014-03-14: qty 80

## 2014-03-14 MED ORDER — ASPIRIN 81 MG PO CHEW
324.0000 mg | CHEWABLE_TABLET | Freq: Once | ORAL | Status: AC
  Administered 2014-03-14: 324 mg via ORAL
  Filled 2014-03-14: qty 4

## 2014-03-14 MED ORDER — OXYCODONE HCL 5 MG PO TABS: 5.0000 mg | ORAL_TABLET | Freq: Three times a day (TID) | ORAL | Status: DC | PRN

## 2014-03-14 MED ORDER — IPRATROPIUM BROMIDE 0.02 % IN SOLN: 0.5000 mg | Freq: Once | RESPIRATORY_TRACT | Status: DC

## 2014-03-14 MED ORDER — LEVOFLOXACIN 750 MG PO TABS: 750.0000 mg | ORAL_TABLET | Freq: Every day | ORAL | Status: DC

## 2014-03-14 MED ORDER — HYDROMORPHONE HCL 1 MG/ML IJ SOLN
0.5000 mg | Freq: Once | INTRAMUSCULAR | Status: AC
  Administered 2014-03-14: 0.5 mg via INTRAVENOUS
  Filled 2014-03-14: qty 1

## 2014-03-14 NOTE — ED Notes (Signed)
MD at bedside. 

## 2014-03-14 NOTE — ED Notes (Signed)
Graham crackers, peanut butter and ginger ale provided.

## 2014-03-14 NOTE — ED Notes (Signed)
Pt ambulatory to [facility] w/o assistance, NAD noted.

## 2014-03-14 NOTE — ED Notes (Signed)
Respiratory therapist at bedside at this time. 

## 2014-03-14 NOTE — ED Notes (Signed)
PA at bedside.

## 2014-03-14 NOTE — ED Notes (Signed)
Per EMS, pt complain of cough and congestion that started today. Pt hyperventilating on arrival to ED. EMS, states pt told them she had been drinking today. Also, pt has old surgical sight to abdomen that is draining beige liquid

## 2014-03-14 NOTE — Discharge Instructions (Signed)
1. Medications: roxicodone, levaquin, usual home medications including your protonix as directed 2. Treatment: rest, drink plenty of fluids, stop smoking, stop drinking alcohol 3. Follow Up: Please followup with your gastroenterologist in 2 days for discussion of your diagnoses and further evaluation after today's visit; if you do not have a primary care doctor use the resource guide provided to find one; Please return to the ER for return of your bleeding or other concerning symptoms    Chest Pain (Nonspecific) It is often hard to give a specific diagnosis for the cause of chest pain. There is always a chance that your pain could be related to something serious, such as a heart attack or a blood clot in the lungs. You need to follow up with your health care provider for further evaluation. CAUSES   Heartburn.  Pneumonia or bronchitis.  Anxiety or stress.  Inflammation around your heart (pericarditis) or lung (pleuritis or pleurisy).  A blood clot in the lung.  A collapsed lung (pneumothorax). It can develop suddenly on its own (spontaneous pneumothorax) or from trauma to the chest.  Shingles infection (herpes zoster virus). The chest wall is composed of bones, muscles, and cartilage. Any of these can be the source of the pain.  The bones can be bruised by injury.  The muscles or cartilage can be strained by coughing or overwork.  The cartilage can be affected by inflammation and become sore (costochondritis). DIAGNOSIS  Lab tests or other studies may be needed to find the cause of your pain. Your health care provider may have you take a test called an ambulatory electrocardiogram (ECG). An ECG records your heartbeat patterns over a 24-hour period. You may also have other tests, such as:  Transthoracic echocardiogram (TTE). During echocardiography, sound waves are used to evaluate how blood flows through your heart.  Transesophageal echocardiogram (TEE).  Cardiac monitoring. This  allows your health care provider to monitor your heart rate and rhythm in real time.  Holter monitor. This is a portable device that records your heartbeat and can help diagnose heart arrhythmias. It allows your health care provider to track your heart activity for several days, if needed.  Stress tests by exercise or by giving medicine that makes the heart beat faster. TREATMENT   Treatment depends on what may be causing your chest pain. Treatment may include:  Acid blockers for heartburn.  Anti-inflammatory medicine.  Pain medicine for inflammatory conditions.  Antibiotics if an infection is present.  You may be advised to change lifestyle habits. This includes stopping smoking and avoiding alcohol, caffeine, and chocolate.  You may be advised to keep your head raised (elevated) when sleeping. This reduces the chance of acid going backward from your stomach into your esophagus. Most of the time, nonspecific chest pain will improve within 2-3 days with rest and mild pain medicine.  HOME CARE INSTRUCTIONS   If antibiotics were prescribed, take them as directed. Finish them even if you start to feel better.  For the next few days, avoid physical activities that bring on chest pain. Continue physical activities as directed.  Do not use any tobacco products, including cigarettes, chewing tobacco, or electronic cigarettes.  Avoid drinking alcohol.  Only take medicine as directed by your health care provider.  Follow your health care provider's suggestions for further testing if your chest pain does not go away.  Keep any follow-up appointments you made. If you do not go to an appointment, you could develop lasting (chronic) problems with pain. If  there is any problem keeping an appointment, call to reschedule. SEEK MEDICAL CARE IF:   Your chest pain does not go away, even after treatment.  You have a rash with blisters on your chest.  You have a fever. SEEK IMMEDIATE MEDICAL  CARE IF:   You have increased chest pain or pain that spreads to your arm, neck, jaw, back, or abdomen.  You have shortness of breath.  You have an increasing cough, or you cough up blood.  You have severe back or abdominal pain.  You feel nauseous or vomit.  You have severe weakness.  You faint.  You have chills. This is an emergency. Do not wait to see if the pain will go away. Get medical help at once. Call your local emergency services (911 in [place] Do not drive yourself to the hospital. MAKE SURE YOU:   Understand these instructions.  Will watch your condition.  Will get help right away if you are not doing well or get worse. Document Released: 12/12/2004 Document Revised: 03/09/2013 Document Reviewed: 10/08/2007 [facility] Patient Information 2015 [place], [place]. This information is not intended to replace advice given to you by your health care provider. Make sure you discuss any questions you have with your health care provider.

## 2014-03-14 NOTE — ED Provider Notes (Signed)
CSN: [id number]     Arrival date & time 03/14/14  8657 History   None    Chief Complaint  Patient presents with  . Cough     (Consider location/radiation/quality/duration/timing/severity/associated sxs/prior Treatment) The history is provided by the patient and medical records. No language interpreter was used.     Angel Price is a 48 y.o. female  with a hx of lupus, thyroid disease, hypertension, recurrent abscess, chronic abdominal pain,, wound infection of the abdomen, anxiety, depression, GERD presents to the Emergency Department complaining of gradual, persistent, progressively worsening chest pain and shortness of breath onset approximately 9 AM this morning. Patient reports she awoke at 7 AM feeling fine. She reports her back to sleep and awoke again at 9 AM with chest pain and shortness of breath with associated diaphoresis. Patient reports that she has not taken any of her GERD medications this morning including her Carafate or Protonix. She reports that she did vomit this morning however this is normal for her and does not normally cause chest pain. Nonbloody and nonbilious emesis. Patient reports her pain is greater than it 10/10.  Nothing makes symptoms better or worse. No treatments prior to arrival. Patient reports she smokes approximately 1/3-1 full pack of cigarettes per day. She denies a history of COPD. She does report associated cough in the last few days but denies fever. Patient also denies chills, headache, neck pain, diarrhea, weakness, dizziness, syncope, dysuria.  Patient was not given aspirin via EMS.   Past Medical History  Diagnosis Date  . Lupus   . 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 Hypertension   . Abscess     soft tissue  . Suicide attempt   . Blood transfusion without reported diagnosis   . Chronic abdominal pain   . Chronic wound infection of abdomen   . Adrenal mass   . Anxiety   . Depression   . GERD (gastroesophageal reflux  disease)   . Sickle cell trait    Past Surgical History  Procedure Laterality Date  . Abdominal surgery    . Abdominal hysterectomy  2013    [place]  . Debridement of abdominal wall abscess N/A 02/08/2013    Procedure: DEBRIDEMENT OF ABDOMINAL WALL ABSCESS;  Surgeon: Jamesetta So, MD;  Location: [facility];  Service: General;  Laterality: N/A;  . Gastric bypass       [place], first 2000, 2005/2006.  . Colonoscopy      in [place]  . Cholecystectomy    . Colonoscopy with propofol N/A 05/20/2013    Dr.Rourk- inadequate prep, normal appearing rectum, grossly normal colon aside from pancolonic diverticula, normal terminal ileum bx= unremarkable colonic mucosa  . Esophagogastroduodenoscopy (egd) with propofol N/A 05/20/2013    Dr.Rourk- s/p prior gastric surgery with normal esophagus, residual gastric mucosa and patent efferent limb  . Esophageal biopsy  05/20/2013    Procedure: BIOPSIES OF ASCENDING AND SIGMOID COLON;  Surgeon: Daneil Dolin, MD;  Location: [facility];  Service: Endoscopy;;  . Wound exploration Right 06/24/2013    Procedure: exploration of traumatic wound right wrist;  Surgeon: Tennis Must, MD;  Location: [facility];  Service: Orthopedics;  Laterality: Right;  . Tendon repar Right     wrist  . Adrenal turmor removal    . Esophagogastroduodenoscopy (egd) with propofol N/A 02/03/2014    Procedure: ESOPHAGOGASTRODUODENOSCOPY (EGD) WITH PROPOFOL;  Surgeon: Daneil Dolin, MD;  Location: [facility];  Service: Endoscopy;  Laterality: N/A;  Family History  Problem Relation Age of Onset  . Brain cancer Son   . Schizophrenia Son   . Cancer Son     brain  . Breast cancer Maternal Aunt   . Bipolar disorder Maternal Aunt   . Drug abuse Maternal Aunt   . Colon cancer Maternal Grandmother     late 7s, early 67s  . Lung cancer Father   . Cancer Father     mets  . Liver disease Neg Hx   . Drug abuse Mother   . Drug abuse Sister   . Drug abuse Brother   . Bipolar disorder Paternal  Grandfather   . Bipolar disorder Cousin    History  Substance Use Topics  . Smoking status: Current Every Day Smoker -- 0.25 packs/day for 35 years    Types: Cigarettes  . Smokeless tobacco: Never Used  . Alcohol Use: 7.2 oz/week    0 Not specified, 12 Cans of beer per week     Comment: as of 11/6: daily but "calmed down", not like I used to. 6 pack of beer daily.    OB History    No data available     Review of Systems  Constitutional: Negative for fever, diaphoresis, appetite change, fatigue and unexpected weight change.  HENT: Negative for mouth sores.   Eyes: Negative for visual disturbance.  Respiratory: Positive for cough, chest tightness, shortness of breath and wheezing.   Cardiovascular: Positive for chest pain.  Gastrointestinal: Positive for vomiting (chronic) and abdominal pain (Chronic). Negative for diarrhea and constipation.  Endocrine: Negative for polydipsia, polyphagia and polyuria.  Genitourinary: Negative for dysuria, urgency, frequency and hematuria.  Musculoskeletal: Negative for back pain and neck stiffness.  Skin: Negative for rash.  Allergic/Immunologic: Negative for immunocompromised state.  Neurological: Negative for syncope, light-headedness and headaches.  Hematological: Does not bruise/bleed easily.  Psychiatric/Behavioral: Negative for sleep disturbance. The patient is not nervous/anxious.       Allergies  Codeine and Morphine and related  Home Medications   Prior to Admission medications   Medication Sig Start Date End Date Taking? Authorizing Provider  ARIPiprazole (ABILIFY) 5 MG tablet Take 1 tablet (5 mg total) by mouth 2 (two) times daily. 07/16/13  Yes Levonne Spiller, MD  escitalopram (LEXAPRO) 20 MG tablet Take 1 tablet (20 mg total) by mouth daily. 07/16/13 07/16/14 Yes Levonne Spiller, MD  fluticasone (FLONASE) 50 MCG/ACT nasal spray Place 1 spray into both nostrils daily.   Yes Historical Provider, MD  lisinopril (PRINIVIL,ZESTRIL) 10 MG  tablet Take 10 mg by mouth daily.   Yes Historical Provider, MD  pantoprazole (PROTONIX) 40 MG tablet Take 1 tablet (40 mg total) by mouth 2 (two) times daily before a meal. 01/21/14  Yes Orvil Feil, NP  sucralfate (CARAFATE) 1 G tablet Take 1 g by mouth 4 (four) times daily.   Yes Historical Provider, MD  levofloxacin (LEVAQUIN) 750 MG tablet Take 1 tablet (750 mg total) by mouth daily. X 10 days 03/14/14   Jarrett Soho Renatha Rosen, PA-C  mupirocin ointment (BACTROBAN) 2 % Place into the nose 2 (two) times daily. Patient not taking: Reported on 03/14/2014 12/13/13   Velta Addison Mikhail, DO  ondansetron (ZOFRAN) 4 MG tablet Take 1 tablet (4 mg total) by mouth 3 (three) times daily. With meals 01/21/14   Orvil Feil, NP  oxyCODONE (ROXICODONE) 5 MG immediate release tablet Take 1 tablet (5 mg total) by mouth every 8 (eight) hours as needed for severe pain. 03/14/14  Annaleigh Steinmeyer, PA-C  oxyCODONE-acetaminophen (PERCOCET) 5-325 MG per tablet Take 1-2 tablets by mouth every 4 (four) hours as needed. Patient not taking: Reported on 01/21/2014 01/14/14   [name], MD  promethazine (PHENERGAN) 25 MG tablet Take 1 tablet (25 mg total) by mouth every 6 (six) hours as needed. Patient taking differently: Take 25 mg by mouth every 6 (six) hours as needed for nausea or vomiting.  01/14/14   [name], MD  sucralfate (CARAFATE) 1 GM/10ML suspension Take 10 mLs (1 g total) by mouth 4 (four) times daily. Patient not taking: Reported on 02/02/2014 01/21/14   Orvil Feil, NP   BP 88/68 mmHg  Pulse 94  Temp(Src) 97.6 F (36.4 C) (Oral)  Resp 25  Ht 5\' 3"  (1.6 m)  Wt 136 lb (61.689 kg)  BMI 24.10 kg/m2  SpO2 100% Physical Exam  Constitutional: She appears well-developed and well-nourished. No distress.  Awake, alert, nontoxic appearance  HENT:  Head: Normocephalic and atraumatic.  Mouth/Throat: Oropharynx is clear and moist. No oropharyngeal exudate.  Eyes: Conjunctivae are normal. No scleral icterus.   Neck: Normal range of motion. Neck supple.  Cardiovascular: Regular rhythm, normal heart sounds and intact distal pulses.   No murmur heard. Mild tachycardia  Pulmonary/Chest: Effort normal and breath sounds normal. No respiratory distress. She has no wheezes.  Equal chest expansion  Abdominal: Soft. Bowel sounds are normal. She exhibits no distension and no mass. There is no tenderness. There is no rebound and no guarding.  Multiple surgical incisions to the abdomen with one small chronic, nonhealing wound to the lower midline area  Musculoskeletal: Normal range of motion. She exhibits no edema.  Neurological: She is alert.  Speech is clear and goal oriented Moves extremities without ataxia  Skin: Skin is warm and dry. She is not diaphoretic.  Psychiatric: Her mood appears anxious. Her affect is angry. She is agitated.  Patient rolling around in the bed, hyperventilating reporting that she can't breathe and that her chest hurts, she is tearful  Nursing note and vitals reviewed.   ED Course  Procedures (including critical care time) Labs Review Labs Reviewed  CBC WITH DIFFERENTIAL - Abnormal; Notable for the following:    Hemoglobin 11.8 (*)    HCT 33.9 (*)    Neutrophils Relative % 32 (*)    Neutro Abs 1.5 (*)    Lymphocytes Relative 61 (*)    All other components within normal limits  ETHANOL - Abnormal; Notable for the following:    Alcohol, Ethyl (B) 266 (*)    All other components within normal limits  COMPREHENSIVE METABOLIC PANEL  TROPONIN I  LIPASE, BLOOD  BRAIN NATRIURETIC PEPTIDE  URINALYSIS, ROUTINE W REFLEX MICROSCOPIC  URINE RAPID DRUG SCREEN (HOSP PERFORMED)  TROPONIN I  POC OCCULT BLOOD, ED    Imaging Review Dg Chest 2 View  03/14/2014   CLINICAL DATA:  Wheezing and shortness of breath. Mid chest pain radiating down the left arm. Chronic productive cough with milky white sputum. History of hypertension.  EXAM: CHEST  2 VIEW  COMPARISON:  03/02/2014 chest  CT and plain film  FINDINGS: Heart size is normal. There is persistent patchy density at the right lung base. Minimal left lower lobe atelectasis. No focal consolidations. No pulmonary edema. Surgical clips are present in the upper abdomen. Visualized osseous structures have a normal appearance.  IMPRESSION: 1. Persistent right lower lobe infiltrate. 2. Minimal left lower lobe atelectasis.   Electronically Signed   By: Eustaquio Maize  Owens Shark M.D.   On: 03/14/2014 11:26     EKG Interpretation   Date/Time:  Monday March 14 2014 10:03:55 EST Ventricular Rate:  94 PR Interval:  147 QRS Duration: 80 QT Interval:  373 QTC Calculation: 466 R Axis:   66 Text Interpretation:  Sinus rhythm No significant change since last  tracing Confirmed by WARD,  DO, KRISTEN ([id number]) on 03/14/2014 10:16:50 AM      MDM   Final diagnoses:  Wheezing  Aspiration pneumonia, unspecified aspiration pneumonia type  Chest pain, unspecified chest pain type   Angel Price presents with cough, wheezing, chest pain and shortness of breath. Patient without cardiac history.  Chronic abdominal pain and open wound of the abdomen also chronic. Patient reports chest pain is new. Aspirin given. Wheezing on lung exam. We'll give albuterol.  Patient is anxious and angry demanding pain medicine. Will hold at this time.  12:33 PM Patient given albuterol treatment with significant improvement in her breath sounds. They're now clear and equal bilaterally.  Patient with resolution of her chest pain after administration of Dilaudid. Patient is intoxicated with an alcohol level of 266. Labs largely reassuring however patient with drop in hemoglobin in the last 2 weeks now at 11.8 down from 14. Patient reports multiple episodes of bright red blood per rectum. She is not on any anticoagulant. Liver enzymes are normal. We'll obtain fecal occult. Do not believe that patient's pain is related to any cardiac condition. Will monitor for several  hours and reassess with delta troponin.  Chest x-ray with questionable pneumonia of the right lower lobe. She has been instructed to follow-up for potential scarring in the right lower lobe but has not done so. Due to patient's history of alcoholism and vomiting will begin treating as a right lower lobe pneumonia with Levaquin.  At this time patient is not tachycardic or persistently short of breath.  2:54 PM Patient is feeling better, sitting up in bed and eating [employer] without difficulty. Patient with delta troponin which is negative. Do not believe the chest pain is of cardiac etiology d/t presentation, PERC negative, VSS, no tracheal deviation, no JVD or new murmur, RRR, breath sounds equal bilaterally, EKG without acute abnormalities, negative troponin. As noted above patient with drop in her hemoglobin however DRE without bright red blood and fecal occult negative. Abdomen is soft and nontender. Patient reports she has a gastroenterologist that she will follow up with this week for her bleeding. She's been given strict return precautions to return here to the emergency department if bleeding returns. Patient has sobered at this time and is able to walk unassisted in the emergency department. Pt has been advised to return to the ED if CP becomes exertional, associated with diaphoresis or nausea, radiates to left jaw/arm, worsens or becomes concerning in any way. Pt appears reliable for follow up and is agreeable to discharge.   Case has been discussed with Dr. Leonides Schanz who agrees with the above plan to discharge.    Jarrett Soho Melessia Kaus, PA-C 03/14/14 [place], DO 03/14/14 1502

## 2014-04-01 ENCOUNTER — Encounter: Payer: Self-pay | Admitting: Gastroenterology

## 2014-04-01 ENCOUNTER — Encounter (HOSPITAL_COMMUNITY): Payer: Self-pay | Admitting: *Deleted

## 2014-04-01 ENCOUNTER — Ambulatory Visit (INDEPENDENT_AMBULATORY_CARE_PROVIDER_SITE_OTHER): Payer: Medicare Other | Admitting: Gastroenterology

## 2014-04-01 ENCOUNTER — Inpatient Hospital Stay (HOSPITAL_COMMUNITY)
Admission: AD | Admit: 2014-04-01 | Discharge: 2014-04-06 | DRG: 391 | Disposition: A | Discharge status: Home / Self Care | Payer: Medicare Other | Source: Ambulatory Visit | Attending: Family Medicine | Admitting: Family Medicine

## 2014-04-01 VITALS — BP 117/87 | HR 105 | Temp 97.2°F | Ht 60.0 in | Wt 122.6 lb

## 2014-04-01 DIAGNOSIS — Z90710 Acquired absence of both cervix and uterus: Secondary | ICD-10-CM | POA: Diagnosis not present

## 2014-04-01 DIAGNOSIS — Z801 Family history of malignant neoplasm of trachea, bronchus and lung: Secondary | ICD-10-CM

## 2014-04-01 DIAGNOSIS — D72819 Decreased white blood cell count, unspecified: Secondary | ICD-10-CM | POA: Diagnosis present

## 2014-04-01 DIAGNOSIS — Z808 Family history of malignant neoplasm of other organs or systems: Secondary | ICD-10-CM

## 2014-04-01 DIAGNOSIS — F1010 Alcohol abuse, uncomplicated: Secondary | ICD-10-CM | POA: Diagnosis present

## 2014-04-01 DIAGNOSIS — R112 Nausea with vomiting, unspecified: Secondary | ICD-10-CM | POA: Diagnosis not present

## 2014-04-01 DIAGNOSIS — F17210 Nicotine dependence, cigarettes, uncomplicated: Secondary | ICD-10-CM | POA: Diagnosis present

## 2014-04-01 DIAGNOSIS — Z885 Allergy status to narcotic agent status: Secondary | ICD-10-CM | POA: Diagnosis not present

## 2014-04-01 DIAGNOSIS — Z6821 Body mass index (BMI) 21.0-21.9, adult: Secondary | ICD-10-CM

## 2014-04-01 DIAGNOSIS — F17200 Nicotine dependence, unspecified, uncomplicated: Secondary | ICD-10-CM | POA: Diagnosis present

## 2014-04-01 DIAGNOSIS — D573 Sickle-cell trait: Secondary | ICD-10-CM | POA: Diagnosis present

## 2014-04-01 DIAGNOSIS — I10 Essential (primary) hypertension: Secondary | ICD-10-CM | POA: Diagnosis present

## 2014-04-01 DIAGNOSIS — A084 Viral intestinal infection, unspecified: Secondary | ICD-10-CM | POA: Diagnosis present

## 2014-04-01 DIAGNOSIS — R109 Unspecified abdominal pain: Secondary | ICD-10-CM | POA: Diagnosis present

## 2014-04-01 DIAGNOSIS — Z818 Family history of other mental and behavioral disorders: Secondary | ICD-10-CM | POA: Diagnosis not present

## 2014-04-01 DIAGNOSIS — E43 Unspecified severe protein-calorie malnutrition: Secondary | ICD-10-CM | POA: Diagnosis present

## 2014-04-01 DIAGNOSIS — K3184 Gastroparesis: Secondary | ICD-10-CM | POA: Diagnosis present

## 2014-04-01 DIAGNOSIS — Z720 Tobacco use: Secondary | ICD-10-CM | POA: Diagnosis present

## 2014-04-01 DIAGNOSIS — L02211 Cutaneous abscess of abdominal wall: Secondary | ICD-10-CM | POA: Diagnosis present

## 2014-04-01 DIAGNOSIS — R1084 Generalized abdominal pain: Secondary | ICD-10-CM | POA: Diagnosis not present

## 2014-04-01 DIAGNOSIS — R110 Nausea: Secondary | ICD-10-CM | POA: Diagnosis not present

## 2014-04-01 DIAGNOSIS — K219 Gastro-esophageal reflux disease without esophagitis: Secondary | ICD-10-CM | POA: Diagnosis present

## 2014-04-01 DIAGNOSIS — K625 Hemorrhage of anus and rectum: Secondary | ICD-10-CM

## 2014-04-01 DIAGNOSIS — K920 Hematemesis: Secondary | ICD-10-CM | POA: Diagnosis not present

## 2014-04-01 DIAGNOSIS — Z803 Family history of malignant neoplasm of breast: Secondary | ICD-10-CM | POA: Diagnosis not present

## 2014-04-01 DIAGNOSIS — R197 Diarrhea, unspecified: Secondary | ICD-10-CM | POA: Diagnosis present

## 2014-04-01 DIAGNOSIS — K529 Noninfective gastroenteritis and colitis, unspecified: Secondary | ICD-10-CM | POA: Diagnosis not present

## 2014-04-01 DIAGNOSIS — Z23 Encounter for immunization: Secondary | ICD-10-CM | POA: Diagnosis not present

## 2014-04-01 DIAGNOSIS — Z800 Family history of malignant neoplasm of digestive organs: Secondary | ICD-10-CM

## 2014-04-01 DIAGNOSIS — K760 Fatty (change of) liver, not elsewhere classified: Secondary | ICD-10-CM | POA: Diagnosis present

## 2014-04-01 DIAGNOSIS — R1110 Vomiting, unspecified: Secondary | ICD-10-CM

## 2014-04-01 LAB — CBC
HCT: 35.9 % — ABNORMAL LOW (ref 36.0–46.0)
HEMOGLOBIN: 12.5 g/dL (ref 12.0–15.0)
MCH: 30.2 pg (ref 26.0–34.0)
MCHC: 34.8 g/dL (ref 30.0–36.0)
MCV: 86.7 fL (ref 78.0–100.0)
Platelets: 241 10*3/uL (ref 150–400)
RBC: 4.14 MIL/uL (ref 3.87–5.11)
RDW: 15.5 % (ref 11.5–15.5)
WBC: 2.8 10*3/uL — AB (ref 4.0–10.5)

## 2014-04-01 LAB — COMPREHENSIVE METABOLIC PANEL
ALT: 37 U/L — ABNORMAL HIGH (ref 0–35)
AST: 88 U/L — ABNORMAL HIGH (ref 0–37)
Albumin: 4.3 g/dL (ref 3.5–5.2)
Alkaline Phosphatase: 84 U/L (ref 39–117)
Anion gap: 12 (ref 5–15)
BUN: 5 mg/dL — ABNORMAL LOW (ref 6–23)
CHLORIDE: 96 meq/L (ref 96–112)
CO2: 26 mmol/L (ref 19–32)
Calcium: 9.3 mg/dL (ref 8.4–10.5)
Creatinine, Ser: 0.54 mg/dL (ref 0.50–1.10)
GFR calc Af Amer: >90 mL/min (ref >90)
Glucose, Bld: 83 mg/dL (ref 70–99)
Potassium: 4.5 mmol/L (ref 3.5–5.1)
Sodium: 134 mmol/L — ABNORMAL LOW (ref 135–145)
TOTAL PROTEIN: 7.6 g/dL (ref 6.0–8.3)
Total Bilirubin: 1 mg/dL (ref 0.3–1.2)

## 2014-04-01 LAB — LIPASE, BLOOD: Lipase: 26 U/L (ref 11–59)

## 2014-04-01 MED ORDER — FOLIC ACID 1 MG PO TABS
1.0000 mg | ORAL_TABLET | Freq: Every day | ORAL | Status: DC
  Administered 2014-04-03 – 2014-04-06 (×5): 1 mg via ORAL
  Filled 2014-04-01 (×6): qty 1

## 2014-04-01 MED ORDER — VITAMIN B-1 100 MG PO TABS
100.0000 mg | ORAL_TABLET | Freq: Every day | ORAL | Status: DC
  Administered 2014-04-02 – 2014-04-06 (×5): 100 mg via ORAL
  Filled 2014-04-01 (×5): qty 1

## 2014-04-01 MED ORDER — HYDRALAZINE HCL 20 MG/ML IJ SOLN
5.0000 mg | INTRAMUSCULAR | Status: DC | PRN
  Administered 2014-04-05 – 2014-04-06 (×2): 5 mg via INTRAVENOUS
  Filled 2014-04-01 (×2): qty 1

## 2014-04-01 MED ORDER — THIAMINE HCL 100 MG/ML IJ SOLN
100.0000 mg | Freq: Every day | INTRAMUSCULAR | Status: DC
  Administered 2014-04-01 – 2014-04-02 (×2): 100 mg via INTRAVENOUS
  Filled 2014-04-01 (×2): qty 2

## 2014-04-01 MED ORDER — SODIUM CHLORIDE 0.9 % IV SOLN
INTRAVENOUS | Status: DC
  Administered 2014-04-01 – 2014-04-02 (×2): via INTRAVENOUS
  Administered 2014-04-03: 1 mL via INTRAVENOUS
  Administered 2014-04-03 – 2014-04-06 (×5): via INTRAVENOUS

## 2014-04-01 MED ORDER — ONDANSETRON HCL 4 MG/2ML IJ SOLN
4.0000 mg | Freq: Four times a day (QID) | INTRAMUSCULAR | Status: DC
  Administered 2014-04-01 – 2014-04-05 (×17): 4 mg via INTRAVENOUS
  Filled 2014-04-01 (×18): qty 2

## 2014-04-01 MED ORDER — ADULT MULTIVITAMIN W/MINERALS CH
1.0000 | ORAL_TABLET | Freq: Every day | ORAL | Status: DC
  Administered 2014-04-03 – 2014-04-06 (×4): 1 via ORAL
  Filled 2014-04-01 (×6): qty 1

## 2014-04-01 MED ORDER — ENOXAPARIN SODIUM 40 MG/0.4ML ~~LOC~~ SOLN
40.0000 mg | SUBCUTANEOUS | Status: DC
  Administered 2014-04-01 – 2014-04-06 (×6): 40 mg via SUBCUTANEOUS
  Filled 2014-04-01 (×6): qty 0.4

## 2014-04-01 MED ORDER — HYDROMORPHONE HCL 1 MG/ML IJ SOLN
0.5000 mg | INTRAMUSCULAR | Status: DC | PRN
  Administered 2014-04-01 – 2014-04-06 (×44): 0.5 mg via INTRAVENOUS
  Filled 2014-04-01 (×44): qty 1

## 2014-04-01 MED ORDER — ACETAMINOPHEN 650 MG RE SUPP: 650.0000 mg | Freq: Four times a day (QID) | RECTAL | Status: DC | PRN

## 2014-04-01 MED ORDER — LORAZEPAM 1 MG PO TABS
1.0000 mg | ORAL_TABLET | Freq: Four times a day (QID) | ORAL | Status: AC | PRN
  Administered 2014-04-01 – 2014-04-02 (×2): 1 mg via ORAL
  Filled 2014-04-01 (×2): qty 1

## 2014-04-01 MED ORDER — FLUTICASONE PROPIONATE 50 MCG/ACT NA SUSP
1.0000 | Freq: Every day | NASAL | Status: DC
  Administered 2014-04-01 – 2014-04-06 (×6): 1 via NASAL
  Filled 2014-04-01 (×2): qty 16

## 2014-04-01 MED ORDER — LORAZEPAM 2 MG/ML IJ SOLN: 1.0000 mg | Freq: Four times a day (QID) | INTRAMUSCULAR | Status: AC | PRN

## 2014-04-01 MED ORDER — METOCLOPRAMIDE HCL 5 MG/ML IJ SOLN
10.0000 mg | Freq: Three times a day (TID) | INTRAMUSCULAR | Status: DC
  Administered 2014-04-01 – 2014-04-04 (×8): 10 mg via INTRAVENOUS
  Filled 2014-04-01 (×8): qty 2

## 2014-04-01 MED ORDER — ACETAMINOPHEN 325 MG PO TABS
650.0000 mg | ORAL_TABLET | Freq: Four times a day (QID) | ORAL | Status: DC | PRN
  Administered 2014-04-06: 650 mg via ORAL
  Filled 2014-04-01 (×2): qty 2

## 2014-04-01 MED ORDER — INFLUENZA VAC SPLIT QUAD 0.5 ML IM SUSY
0.5000 mL | PREFILLED_SYRINGE | INTRAMUSCULAR | Status: AC
  Administered 2014-04-02: 0.5 mL via INTRAMUSCULAR
  Filled 2014-04-01: qty 0.5

## 2014-04-01 NOTE — Progress Notes (Addendum)
INITIAL NUTRITION ASSESSMENT  DOCUMENTATION CODES Per approved criteria  -Severe malnutrition in the context of acute illness    Pt meets criteria for severe MALNUTRITION in the context of acute illness as evidenced by energy intake </= 50% for >/= 5 days and >5% wt loss in 1 month.   INTERVENTION: Follow for diet advancement and nutrition care plan  NUTRITION DIAGNOSIS: Inadequate oral intake related to gastroenteritis as evidenced by pt hx of no intake since Sunday and 6% wt loss in 30 days.   Goal: Pt to meet >/= 90% of their estimated nutrition needs    Monitor:  Diet advancement/tolerance protein-energy intake, labs and wt trends   Reason for Assessment: Malnutrition Screen  49 y.o. female  Admitting Dx: Gastroenteritis  ASSESSMENT: Pt presents with emesis, rectal bleeding, loose stools and abdominal pain. She has hx of Billroth II, chronic abdominal pain, gastroparesis and alcohol abuse.  Diet hx: pt says she has been unable to tolerate anything by mouth since Sunday and has been vomiting with food and beverage intake.  Weight hx: pt has significant weight loss of 6% over past 30 days   Nutrition focused exam: mild muscle wasting to clavicle region and fat mass loss to upper arms, no edema noted. Orbital and temporal regions within desirable limits.  Height: Ht Readings from Last 1 Encounters:  04/01/14 5\' 3"  (1.6 m)    Weight: Wt Readings from Last 1 Encounters:  04/01/14 121 lb 1.6 oz (54.931 kg)    Ideal Body Weight: 115#  % Ideal Body Weight: 105%  Wt Readings from Last 10 Encounters:  04/01/14 121 lb 1.6 oz (54.931 kg)  04/01/14 122 lb 9.6 oz (55.611 kg)  03/14/14 136 lb (61.689 kg)  03/02/14 129 lb (58.514 kg)  02/02/14 129 lb (58.514 kg)  01/21/14 126 lb (57.153 kg)  01/21/14 126 lb (57.153 kg)  01/14/14 130 lb (58.968 kg)  01/03/14 134 lb (60.782 kg)  12/11/13 124 lb 1.6 oz (56.291 kg)    Usual Body Weight: 124-130#  % Usual Body Weight:  94%  BMI:  Body mass index is 21.46 kg/(m^2). normal range  Estimated Nutritional Needs: Kcal: 1500-1800 Protein: 80-90 gr Fluid: 1.5-1.8 liters daily  Skin: intact  Diet Order:   NPO  EDUCATION NEEDS: -No education needs identified at this time  No intake or output data in the 24 hours ending 04/01/14 1118  Last BM: PTA  Labs:  No results for input(s): NA, K, CL, CO2, BUN, CREATININE, CALCIUM, MG, PHOS, GLUCOSE in the last 168 hours.  CBG (last 3)  No results for input(s): GLUCAP in the last 72 hours.   Continuous Infusions:  Past Medical History  Diagnosis Date  . Lupus   . 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 Hypertension   . Abscess     soft tissue  . Suicide attempt   . Blood transfusion without reported diagnosis   . Chronic abdominal pain   . Chronic wound infection of abdomen   . Adrenal mass   . Anxiety   . Depression   . GERD (gastroesophageal reflux disease)   . Sickle cell trait   . Gastroparesis Nov 2015    Past Surgical History  Procedure Laterality Date  . Abdominal surgery    . Abdominal hysterectomy  2013    [place]  . Debridement of abdominal wall abscess N/A 02/08/2013    Procedure: DEBRIDEMENT OF ABDOMINAL WALL ABSCESS;  Surgeon: Jamesetta So, MD;  Location: [facility];  Service: General;  Laterality: N/A;  . Billroth ii procedure       [place], first 2000, 2005/2006.  . Colonoscopy      in [place]  . Cholecystectomy    . Colonoscopy with propofol N/A 05/20/2013    Dr.Rourk- inadequate prep, normal appearing rectum, grossly normal colon aside from pancolonic diverticula, normal terminal ileum bx= unremarkable colonic mucosa. Due for early interval 2016.   [facility]land Kitchen Esophagogastroduodenoscopy (egd) with propofol N/A 05/20/2013    Dr.Rourk- s/p prior gastric surgery with normal esophagus, residual gastric mucosa and patent efferent limb  . Esophageal biopsy  05/20/2013    Procedure: BIOPSIES OF ASCENDING AND SIGMOID COLON;  Surgeon:  Daneil Dolin, MD;  Location: [facility];  Service: Endoscopy;;  . Wound exploration Right 06/24/2013    Procedure: exploration of traumatic wound right wrist;  Surgeon: Tennis Must, MD;  Location: [facility];  Service: Orthopedics;  Laterality: Right;  . Tendon repar Right     wrist  . Adrenal turmor removal    . Esophagogastroduodenoscopy (egd) with propofol N/A 02/03/2014    Dr. Gala Romney:  s/p hemigastrectomy with retained gastric contents. Residual gastric mucosa and efferent limb appeared normal otherwise. Query gastroparesis.    Colman Cater MS,RD,CSG,LDN Office: [phone] Pager: [phone]

## 2014-04-01 NOTE — Progress Notes (Signed)
Primary Care Physician:  Doran Heater, MD  Primary GI: Dr. Gala Romney   Chief Complaint  Patient presents with  . Emesis  . Rectal Bleeding  . Abdominal Pain    HPI:   Angel Price is a 49 y.o. 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 Last EGD in Nov 2015 with hemigastrectomy, residual gastric mucosa and efferent limb appearing normal otherwise, retained gastric contents. Colonoscopy last year with inadequate prep and need for early interval screening 2016.   No intake since Sunday. Symptom onset Sunday. Will drink water but it comes back up. Notes chest discomfort, diffuse abdominal discomfort. Been in the bed since Sunday. Can't do anything. Constant N/V. Notes bright red blood in emesis. Odynophagia. +chills, no fever. No sick contacts. Tried to drink some ETOH to get some relief but unable to tolerate it. Tried 3 different times.  Abdominal pain is constant. Cramps up, feels like something is moving. Urine dark/orange. Unable to take any prescribed medications.    Wiped this morning had small amount of paper hematochezia. Notes loose stools "all day" but "not much". Rectum feels like it is on fire. Notes prior to Sunday would have normal BMs. Antibiotic exposure: Levaquin about 3 weeks ago. 3 beers in the past week but "comes right back up". Denies any current drug use.    Past Medical History  Diagnosis Date  . Lupus   . 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 Hypertension   . Abscess     soft tissue  . Suicide attempt   . Blood transfusion without reported diagnosis   . Chronic abdominal pain   . Chronic wound infection of abdomen   . Adrenal mass   . Anxiety   . Depression   . GERD (gastroesophageal reflux disease)   . Sickle cell trait   . Gastroparesis Nov 2015    Past Surgical  History  Procedure Laterality Date  . Abdominal surgery    . Abdominal hysterectomy  2013    [place]  . Debridement of abdominal wall abscess N/A 02/08/2013    Procedure: DEBRIDEMENT OF ABDOMINAL WALL ABSCESS;  Surgeon: Jamesetta So, MD;  Location: [facility];  Service: General;  Laterality: N/A;  . Gastric bypass       [place], first 2000, 2005/2006.  . Colonoscopy      in [place]  . Cholecystectomy    . Colonoscopy with propofol N/A 05/20/2013    Dr.Rourk- inadequate prep, normal appearing rectum, grossly normal colon aside from pancolonic diverticula, normal terminal ileum bx= unremarkable colonic mucosa. Due for early interval 2016.   Marland Kitchen Esophagogastroduodenoscopy (egd) with propofol N/A 05/20/2013    Dr.Rourk- s/p prior gastric surgery with normal esophagus, residual gastric mucosa and patent efferent limb  . Esophageal biopsy  05/20/2013    Procedure: BIOPSIES OF ASCENDING AND SIGMOID COLON;  Surgeon: Daneil Dolin, MD;  Location: [facility];  Service: Endoscopy;;  . Wound exploration Right 06/24/2013    Procedure: exploration of traumatic wound right wrist;  Surgeon: Tennis Must, MD;  Location: [facility];  Service: Orthopedics;  Laterality: Right;  . Tendon repar Right     wrist  . Adrenal turmor removal    . Esophagogastroduodenoscopy (egd) with propofol N/A 02/03/2014    Dr. Gala Romney:  s/p hemigastrectomy with retained gastric contents. Residual gastric  mucosa and efferent limb appeared normal otherwise. Query gastroparesis.     Current Outpatient Prescriptions  Medication Sig Dispense Refill  . ARIPiprazole (ABILIFY) 5 MG tablet Take 1 tablet (5 mg total) by mouth 2 (two) times daily. 60 tablet 2  . escitalopram (LEXAPRO) 20 MG tablet Take 1 tablet (20 mg total) by mouth daily. 30 tablet 2  . fluticasone (FLONASE) 50 MCG/ACT nasal spray Place 1 spray into both nostrils daily.    Marland Kitchen lisinopril (PRINIVIL,ZESTRIL) 10 MG tablet Take 10 mg by mouth daily.    . mupirocin ointment (BACTROBAN) 2 %  Place into the nose 2 (two) times daily. 22 g 0  . ondansetron (ZOFRAN) 4 MG tablet Take 1 tablet (4 mg total) by mouth 3 (three) times daily. With meals 90 tablet 1  . pantoprazole (PROTONIX) 40 MG tablet Take 1 tablet (40 mg total) by mouth 2 (two) times daily before a meal. 60 tablet 3  . promethazine (PHENERGAN) 25 MG tablet Take 1 tablet (25 mg total) by mouth every 6 (six) hours as needed. (Patient taking differently: Take 25 mg by mouth every 6 (six) hours as needed for nausea or vomiting. ) 20 tablet 0  . sucralfate (CARAFATE) 1 G tablet Take 1 g by mouth 4 (four) times daily.    Marland Kitchen levofloxacin (LEVAQUIN) 750 MG tablet Take 1 tablet (750 mg total) by mouth daily. X 10 days (Patient not taking: Reported on 04/01/2014) 10 tablet 0  . oxyCODONE (ROXICODONE) 5 MG immediate release tablet Take 1 tablet (5 mg total) by mouth every 8 (eight) hours as needed for severe pain. (Patient not taking: Reported on 04/01/2014) 3 tablet 0  . oxyCODONE-acetaminophen (PERCOCET) 5-325 MG per tablet Take 1-2 tablets by mouth every 4 (four) hours as needed. (Patient not taking: Reported on 04/01/2014) 25 tablet 0  . sucralfate (CARAFATE) 1 GM/10ML suspension Take 10 mLs (1 g total) by mouth 4 (four) times daily. (Patient not taking: Reported on 04/01/2014) 420 mL 1   No current facility-administered medications for this visit.    Allergies as of 04/01/2014 - Review Complete 04/01/2014  Allergen Reaction Noted  . Codeine Hives 02/06/2013  . Morphine and related Hives 02/06/2013    Family History  Problem Relation Age of Onset  . Brain cancer Son   . Schizophrenia Son   . Cancer Son     brain  . Breast cancer Maternal Aunt   . Bipolar disorder Maternal Aunt   . Drug abuse Maternal Aunt   . Colon cancer Maternal Grandmother     late 57s, early 44s  . Lung cancer Father   . Cancer Father     mets  . Liver disease Neg Hx   . Drug abuse Mother   . Drug abuse Sister   . Drug abuse Brother   . Bipolar  disorder Paternal Grandfather   . Bipolar disorder Cousin     History   Social History  . Marital Status: Legally Separated    Spouse Name: N/A    Number of Children: N/A  . Years of Education: N/A   Social History Main Topics  . Smoking status: Current Every Day Smoker -- 0.25 packs/day for 35 years    Types: Cigarettes  . Smokeless tobacco: Never Used  . Alcohol Use: 7.2 oz/week    0 Not specified, 12 Cans of beer per week     Comment: as of 11/6: daily but "calmed down", not like I used to. 6 pack of  beer daily.   . Drug Use: No     Comment: quit 09/2012  . Sexual Activity: Yes    Birth Control/ Protection: Surgical   Other Topics Concern  . None   Social History Narrative    Review of Systems: As mentioned in HPI  Physical Exam: BP 117/87 mmHg  Pulse 105  Temp(Src) 97.2 F (36.2 C) (Oral)  Ht 5' (1.524 m)  Wt 122 lb 9.6 oz (55.611 kg)  BMI 23.94 kg/m2 General:   Appears weak, fatigued Head:  Normocephalic and atraumatic. Eyes:  Conjuctiva clear without scleral icterus. Mouth:  Oral mucosa pink  Heart:  S1, S2 present, bounding, tachycardic Abdomen:  Bowel sounds hypoactive, TTP diffusely but more prominent in LLQ, multiple surgical scars noted, 2 small areas in lower abdomen with chronic serous/green drainage Msk:  Symmetrical without gross deformities. Normal posture. Extremities:  Without edema. Neurologic:  Alert and  oriented x4 Psych:  Alert and cooperative. Flat affect.    Impression: 49 year old female with multiple GI complaints and concern for gastroenteritis, exacerbating known gastroparesis, causing intractable nausea, vomiting. Hematemesis likely secondary to repetitive vomiting (Mallory-Weiss tear), esophagitis, doubt PUD. Recent EGD Nov 2015 on file. Clinical concern for dehydration due to presentation.   Acute on chronic abdominal pain: multifactorial. Multiple CTs performed in past year. At last office visit in Nov 2015, I reviewed CT from  Oct 30th with Dr. Ardeen Garland, with no evidence for internal hernia. Would hold on any further abdominal imaging unless clinical findings, labs prompt further evaluation.   Hematochezia: low-volume in the setting of diarrhea. Would recommend Anusol suppositories BID X 7 days. Needs early interval routine screening colonoscopy in 2016 when current symptoms resolve.   Loose stools: likely related to gastroenteritis. Would obtain stool studies to include Cdiff by PCR.   Plan: 1. Admit to general floor. Discussed with Dr. Roderic Palau, who will be admitting physician.  2. Labs  3. IV fluids, supportive care 4. Recommend PPI IV BID 5. Recommend Zofran 4 mg IV scheduled 6. Recommend Phenergan prn 7. Recommend Stool studies 8. Hold on imaging studies unless clinically indicated 9. Doubt any need for invasive procedure this admission. Will follow with you starting 04/02/14.   Orvil Feil, ANP-BC [facility]

## 2014-04-01 NOTE — Patient Instructions (Signed)
Please go to admissions, as you need to be inpatient for fluids and blood work.   We will see you over the weekend while inpatient.

## 2014-04-01 NOTE — H&P (Signed)
[facility] History and Physical  Angel Price YYT:[id number] DOB: 03/09/1966 DOA: 04/01/2014  Referring physician:  PCP: Doran Heater, MD   Chief Complaint: persistent   HPI: Angel Price is a 49 y.o. 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 Initial evaluation reveals tachycardia and mild hypertension.  She reports for the last 5 days gradual worsening of abdominal pain, frequent watery stools and intractable nausea and vomiting. She reports an inability to keep even water down. Reports virtually no by mouth intake in the last 4 days. She also reports some "red flex" in her emesis but states emesis is mostly "foam".she reports small amount blood on toilet paper this am.  Associated symptoms include palpitations headache and intermittent dizziness. She describes the abdominal pain as a sharp cramp-like particularly around bowel movements that is different from her usual chronic pain in its location and intensity. Denies fever chills, sob. She also reports completing coarse of Levaquin 3 weeks ago for pna. Reports having had 3 beers yesterday and has only consumed 3-4 in the last 5 days but typically consumes 6-12 beers daily.  At the time of my exam she is afebrile mildly tachycardic with a heart rate of 12 and a blood pressure 129/95, she is not hypoxic.   Review of Systems:  10 point review of systems complete and all systems are negative except as indicated in history of present illness   Past Medical History  Diagnosis Date  . Lupus   . 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 Hypertension   . Abscess     soft tissue  . Suicide attempt   .  Blood transfusion without reported diagnosis   . Chronic abdominal pain   . Chronic wound infection of abdomen   . Adrenal mass   . Anxiety   . Depression   . GERD (gastroesophageal reflux disease)   . Sickle cell trait   . Gastroparesis Nov 2015   Past Surgical History  Procedure Laterality Date  . Abdominal surgery    . Abdominal hysterectomy  2013    [place]  . Debridement of abdominal wall abscess N/A 02/08/2013    Procedure: DEBRIDEMENT OF ABDOMINAL WALL ABSCESS;  Surgeon: Jamesetta So, MD;  Location: [facility];  Service: General;  Laterality: N/A;  . Billroth ii procedure       [place], first 2000, 2005/2006.  . Colonoscopy      in [place]  . Cholecystectomy    . Colonoscopy with propofol N/A 05/20/2013    Dr.Rourk- inadequate prep, normal appearing rectum, grossly normal colon aside from pancolonic diverticula, normal terminal ileum bx= unremarkable colonic mucosa. Due for early interval 2016.   Marland Kitchen Esophagogastroduodenoscopy (egd) with propofol N/A 05/20/2013    Dr.Rourk- s/p prior gastric surgery with normal esophagus, residual gastric mucosa and patent efferent limb  . Esophageal biopsy  05/20/2013    Procedure: BIOPSIES OF ASCENDING AND SIGMOID COLON;  Surgeon: Daneil Dolin, MD;  Location: [facility];  Service: Endoscopy;;  . Wound exploration Right 06/24/2013    Procedure: exploration of traumatic wound right wrist;  Surgeon: Tennis Must, MD;  Location: [facility];  Service: Orthopedics;  Laterality: Right;  . Tendon repar Right  wrist  . Adrenal turmor removal    . Esophagogastroduodenoscopy (egd) with propofol N/A 02/03/2014    Dr. Gala Romney:  s/p hemigastrectomy with retained gastric contents. Residual gastric mucosa and efferent limb appeared normal otherwise. Query gastroparesis.    Social History:  reports that she has been smoking Cigarettes.  She has a 8.75 pack-year smoking history. She has never used smokeless tobacco. She reports that she drinks about 7.2 oz of alcohol  per week. She reports that she does not use illicit drugs. She smokes, she drinks 6-12 beers a day she is unemployed she lives at home with her parents she is independent with ADLs Allergies  Allergen Reactions  . Codeine Hives  . Morphine And Related Hives    gastritis    Family History  Problem Relation Age of Onset  . Brain cancer Son   . Schizophrenia Son   . Cancer Son     brain  . Breast cancer Maternal Aunt   . Bipolar disorder Maternal Aunt   . Drug abuse Maternal Aunt   . Colon cancer Maternal Grandmother     late 24s, early 47s  . Lung cancer Father   . Cancer Father     mets  . Liver disease Neg Hx   . Drug abuse Mother   . Drug abuse Sister   . Drug abuse Brother   . Bipolar disorder Paternal Grandfather   . Bipolar disorder Cousin      Prior to Admission medications   Medication Sig Start Date End Date Taking? Authorizing Provider  ARIPiprazole (ABILIFY) 5 MG tablet Take 1 tablet (5 mg total) by mouth 2 (two) times daily. 07/16/13   Levonne Spiller, MD  escitalopram (LEXAPRO) 20 MG tablet Take 1 tablet (20 mg total) by mouth daily. 07/16/13 07/16/14  Levonne Spiller, MD  fluticasone (FLONASE) 50 MCG/ACT nasal spray Place 1 spray into both nostrils daily.    Historical Provider, MD  lisinopril (PRINIVIL,ZESTRIL) 10 MG tablet Take 10 mg by mouth daily.    Historical Provider, MD  mupirocin ointment (BACTROBAN) 2 % Place into the nose 2 (two) times daily. 12/13/13   Maryann Mikhail, DO  ondansetron (ZOFRAN) 4 MG tablet Take 1 tablet (4 mg total) by mouth 3 (three) times daily. With meals 01/21/14   Orvil Feil, NP  pantoprazole (PROTONIX) 40 MG tablet Take 1 tablet (40 mg total) by mouth 2 (two) times daily before a meal. 01/21/14   Orvil Feil, NP  promethazine (PHENERGAN) 25 MG tablet Take 1 tablet (25 mg total) by mouth every 6 (six) hours as needed. Patient taking differently: Take 25 mg by mouth every 6 (six) hours as needed for nausea or vomiting.  01/14/14   [name], MD   sucralfate (CARAFATE) 1 G tablet Take 1 g by mouth 4 (four) times daily.    Historical Provider, MD   Physical Exam: Filed Vitals:   04/01/14 1056  BP: 129/95  Pulse: 102  Temp: 98.9 F (37.2 C)  TempSrc: Oral  Height: 5\' 3"  (1.6 m)  Weight: 54.931 kg (121 lb 1.6 oz)  SpO2: 100%    Wt Readings from Last 3 Encounters:  04/01/14 54.931 kg (121 lb 1.6 oz)  04/01/14 55.611 kg (122 lb 9.6 oz)  03/14/14 61.689 kg (136 lb)    General:  Appears calm and comfortable Eyes: PERRL, normal lids, irises & conjunctiva ENT: grossly normal hearing, mucous membranes of her mouth are slightly dry but pink Neck: no LAD, masses or  thyromegaly Cardiovascular: Tachycardic but regular, no m/r/g. No LE edema.  Respiratory:  Normal respiratory effort. Rest sounds with diffuse coarseness I hear no wheeze or crackles Abdomen: soft, ntnd, sluggish BS, diffuse tenderness with mild palpation. Abdominal scars fairly well healed with 2 open wounds mild amount greenish drainage Skin: no rash or induration seen on limited exam Musculoskeletal: grossly normal tone BUE/BLE Psychiatric: grossly normal mood and affect, speech fluent and appropriate Neurologic: grossly non-focal.          Labs on Admission:  Basic Metabolic Panel: No results for input(s): NA, K, CL, CO2, GLUCOSE, BUN, CREATININE, CALCIUM, MG, PHOS in the last 168 hours. Liver Function Tests: No results for input(s): AST, ALT, ALKPHOS, BILITOT, PROT, ALBUMIN in the last 168 hours. No results for input(s): LIPASE, AMYLASE in the last 168 hours. No results for input(s): AMMONIA in the last 168 hours. CBC:  Recent Labs Lab 04/01/14 1132  WBC 2.8*  HGB 12.5  HCT 35.9*  MCV 86.7  PLT 241   Cardiac Enzymes: No results for input(s): CKTOTAL, CKMB, CKMBINDEX, TROPONINI in the last 168 hours.  BNP (last 3 results) No results for input(s): PROBNP in the last 8760 hours. CBG: No results for input(s): GLUCAP in the last 168  hours.  Radiological Exams on Admission: No results found.  EKG:   Assessment/Plan Principal Problem:   Gastroenteritis: may be related to ETOH gastroparesis. Will admit and provide supportive therapy in form of bowel rest, pain/nausea management, reglan, IV fluids. Will obtain stool sample for GI pathogen.   Active Problems: Nausea with vomiting: scheduled zofran, bowel rest , reglan. See #1.    Diarrhea: see #1. GI pathogen.     Abdominal pain: acute on chronic. Worsening may be multi-factorial i.e ETOH consumption, gastroenterisis. Pain management. Has had several CT in recent hx. Will hold off on further imaging at this time.     Tobacco abuse: cessation counseling    Alcohol abuse: reports 6-12 cans of beer daily. Last drink yesterday with 3 beers. CIWA protocol    Essential hypertension, benign: Lisinopril at home. Provide prn hydralazine      Code Status: full DVT Prophylaxis: Family Communication: none present  Disposition Plan: home when ready  Time spent: 88 minutes  [facility] Pager [phone]

## 2014-04-02 DIAGNOSIS — K625 Hemorrhage of anus and rectum: Secondary | ICD-10-CM

## 2014-04-02 DIAGNOSIS — A084 Viral intestinal infection, unspecified: Secondary | ICD-10-CM | POA: Diagnosis not present

## 2014-04-02 DIAGNOSIS — R110 Nausea: Secondary | ICD-10-CM

## 2014-04-02 DIAGNOSIS — R1084 Generalized abdominal pain: Secondary | ICD-10-CM

## 2014-04-02 DIAGNOSIS — K920 Hematemesis: Secondary | ICD-10-CM

## 2014-04-02 LAB — CBC
HCT: 30.4 % — ABNORMAL LOW (ref 36.0–46.0)
HEMOGLOBIN: 10.7 g/dL — AB (ref 12.0–15.0)
MCH: 31 pg (ref 26.0–34.0)
MCHC: 35.2 g/dL (ref 30.0–36.0)
MCV: 88.1 fL (ref 78.0–100.0)
Platelets: 233 10*3/uL (ref 150–400)
RBC: 3.45 MIL/uL — AB (ref 3.87–5.11)
RDW: 15.6 % — ABNORMAL HIGH (ref 11.5–15.5)
WBC: 3.8 10*3/uL — ABNORMAL LOW (ref 4.0–10.5)

## 2014-04-02 LAB — DIFFERENTIAL
BASOS ABS: 0 10*3/uL (ref 0.0–0.1)
Basophils Relative: 0 % (ref 0–1)
EOS ABS: 0 10*3/uL (ref 0.0–0.7)
EOS PCT: 1 % (ref 0–5)
Lymphocytes Relative: 47 % — ABNORMAL HIGH (ref 12–46)
Lymphs Abs: 1.7 10*3/uL (ref 0.7–4.0)
Monocytes Absolute: 0.4 10*3/uL (ref 0.1–1.0)
Monocytes Relative: 12 % (ref 3–12)
NEUTROS PCT: 40 % — AB (ref 43–77)
Neutro Abs: 1.4 10*3/uL — ABNORMAL LOW (ref 1.7–7.7)

## 2014-04-02 LAB — COMPREHENSIVE METABOLIC PANEL
ALK PHOS: 66 U/L (ref 39–117)
ALT: 29 U/L (ref 0–35)
AST: 56 U/L — ABNORMAL HIGH (ref 0–37)
Albumin: 3.6 g/dL (ref 3.5–5.2)
Anion gap: 9 (ref 5–15)
BILIRUBIN TOTAL: 1.2 mg/dL (ref 0.3–1.2)
BUN: 8 mg/dL (ref 6–23)
CHLORIDE: 101 meq/L (ref 96–112)
CO2: 25 mmol/L (ref 19–32)
Calcium: 8.8 mg/dL (ref 8.4–10.5)
Creatinine, Ser: 0.56 mg/dL (ref 0.50–1.10)
GFR calc non Af Amer: >90 mL/min (ref >90)
Glucose, Bld: 68 mg/dL — ABNORMAL LOW (ref 70–99)
Potassium: 3.2 mmol/L — ABNORMAL LOW (ref 3.5–5.1)
Sodium: 135 mmol/L (ref 135–145)
Total Protein: 6.2 g/dL (ref 6.0–8.3)

## 2014-04-02 MED ORDER — POTASSIUM CHLORIDE CRYS ER 20 MEQ PO TBCR
40.0000 meq | EXTENDED_RELEASE_TABLET | ORAL | Status: AC
  Administered 2014-04-02 (×2): 40 meq via ORAL
  Filled 2014-04-02 (×2): qty 2

## 2014-04-02 NOTE — Progress Notes (Signed)
[facility] PROGRESS NOTE  Angel Price IRC:[id number] DOB: 1965/12/01 DOA: 04/01/2014 PCP: Doran Heater, MD  Assessment/Plan: 1. Acute gastroenteritis. Felt to be viral in etiology. Continue supportive treatment with antiemetics, IV fluids. Advance diet as tolerated. 2. Nausea and vomiting. Related to #1. Continue scheduled Zofran. 3. Gastroparesis. Likely worsened by #1. Continue Reglan. 4. Diarrhea. She had pathogen panel is in process. 5. Alcohol abuse. Currently no signs or trauma. Continue on CIWA protocol 6. Essential hypertension. Blood pressure stable   Code Status: Full code Family Communication: Discussed with patient Disposition Plan: Discharge home once improved   Consultants:  Gastroenterology  Procedures:    Antibiotics:    HPI/Subjective: Feeling a little better today. Continues to have nausea and vomiting. Still has some loose stools.  Objective: Filed Vitals:   04/02/14 1416  BP: 101/80  Pulse: 79  Temp: 97.4 F (36.3 C)  Resp:     Intake/Output Summary (Last 24 hours) at 04/02/14 1555 Last data filed at 04/02/14 0800  Gross per 24 hour  Intake      0 ml  Output      0 ml  Net      0 ml   Filed Weights   04/01/14 1056 04/01/14 1245 04/02/14 0705  Weight: 54.931 kg (121 lb 1.6 oz) 54.931 kg (121 lb 1.6 oz) 57.4 kg (126 lb 8.7 oz)    Exam:   General:  NAD  Cardiovascular: S1, S2 RRR  Respiratory: cta b  Abdomen: soft, diffusely tender, bs+  Musculoskeletal: no edema b/l   Data Reviewed: Basic Metabolic Panel:  Recent Labs Lab 04/01/14 1132 04/02/14 0537  NA 134* 135  K 4.5 3.2*  CL 96 101  CO2 26 25  GLUCOSE 83 68*  BUN 5* 8  CREATININE 0.54 0.56  CALCIUM 9.3 8.8   Liver Function Tests:  Recent Labs Lab 04/01/14 1132 04/02/14 0537  AST 88* 56*  ALT 37* 29  ALKPHOS 84 66  BILITOT 1.0 1.2  PROT 7.6 6.2  ALBUMIN 4.3 3.6    Recent Labs Lab 04/01/14 1132  LIPASE 26   No results for input(s):  AMMONIA in the last 168 hours. CBC:  Recent Labs Lab 04/01/14 1132 04/02/14 0537  WBC 2.8* 3.8*  NEUTROABS  --  1.4*  HGB 12.5 10.7*  HCT 35.9* 30.4*  MCV 86.7 88.1  PLT 241 233   Cardiac Enzymes: No results for input(s): CKTOTAL, CKMB, CKMBINDEX, TROPONINI in the last 168 hours. BNP (last 3 results) No results for input(s): PROBNP in the last 8760 hours. CBG: No results for input(s): GLUCAP in the last 168 hours.  No results found for this or any previous visit (from the past 240 hour(s)).   Studies: No results found.  Scheduled Meds: . enoxaparin (LOVENOX) injection  40 mg Subcutaneous Q24H  . fluticasone  1 spray Each Nare Daily  . folic acid  1 mg Oral Daily  . metoCLOPramide (REGLAN) injection  10 mg Intravenous 3 times per day  . multivitamin with minerals  1 tablet Oral Daily  . ondansetron (ZOFRAN) IV  4 mg Intravenous 4 times per day  . potassium chloride  40 mEq Oral Q3H  . thiamine  100 mg Oral Daily   Or  . thiamine  100 mg Intravenous Daily   Continuous Infusions: . sodium chloride 75 mL/hr at 04/02/14 1459    Principal Problem:   Gastroenteritis Active Problems:   Tobacco abuse   Alcohol abuse   Essential hypertension,  benign   Nausea with vomiting   Diarrhea   Abdominal pain    Time spent: 19mins    Zakye Baby  [facility] Pager [phone]. If 7PM-7AM, please contact night-coverage at [email or web address], password [email or web address] 04/02/2014, 3:55 PM  LOS: 1 day

## 2014-04-02 NOTE — Progress Notes (Signed)
  Subjective:  Patient states she does not feel any better. She continues to complain of abdominal pain. She did have nausea and vomiting but she just vomited frothy liquid. She would like to try liquids today. She has not had a bowel movement since admission. She states symptoms started 6 days ago. She has not had fever.  Objective: Blood pressure 101/72, pulse 77, temperature 98.1 F (36.7 C), temperature source Oral, resp. rate 20, height 5\' 3"  (1.6 m), weight 126 lb 8.7 oz (57.4 kg), SpO2 100 %. Patient is alert and in no acute distress. Cardiac exam with regular rhythm normal S1 and S2. No murmur or gallop noted. Lungs are clear to auscultation. Abdomen. She has upper and mid line abdominal scar. She has 2 small open areas covered with dressing on either side of midline below the level of umbilicus. Bowel sounds are normal. Abdomen is soft except indurated area below umbilicus felt to be due to calcified abdominal wall. No LE edema or clubbing noted.  Labs/studies Results:   Recent Labs  04/01/14 1132 04/02/14 0537  WBC 2.8* 3.8*  HGB 12.5 10.7*  HCT 35.9* 30.4*  PLT 241 233    BMET   Recent Labs  04/01/14 1132 04/02/14 0537  NA 134* 135  K 4.5 3.2*  CL 96 101  CO2 26 25  GLUCOSE 83 68*  BUN 5* 8  CREATININE 0.54 0.56  CALCIUM 9.3 8.8    LFT   Recent Labs  04/01/14 1132 04/02/14 0537  PROT 7.6 6.2  ALBUMIN 4.3 3.6  AST 88* 56*  ALT 37* 29  ALKPHOS 84 66  BILITOT 1.0 1.2     Assessment:  #1. Acute symptoms of nausea vomiting and diarrhea would appear to be acute gastroenteritis. Leukopenia would suggest while etiology. She does not appear to be septic or acutely ill. #2. Mildly elevated AST and ALT on admission. AST greater than ALT. AST has decreased and ALT is now normal. Abdominopelvic CT from one month ago suggested fatty liver. She also has history of excessive alcohol intake. #3. 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 #4.  Hematochezia. She hasn't had further episodes of bleeding. Hemoglobin has dropped by 1.8 g post hydration. She will undergo colonoscopy electively on outpatient basis as recommended by Dr. Gala Romney.  Recommendations;  Begin clear liquids. Ask lab to do differential on CBC from this morning. CBC in a.m.

## 2014-04-03 LAB — BASIC METABOLIC PANEL
Anion gap: 4 — ABNORMAL LOW (ref 5–15)
CALCIUM: 8.8 mg/dL (ref 8.4–10.5)
CHLORIDE: 107 meq/L (ref 96–112)
CO2: 27 mmol/L (ref 19–32)
CREATININE: 0.48 mg/dL — AB (ref 0.50–1.10)
GFR calc Af Amer: >90 mL/min (ref >90)
GFR calc non Af Amer: >90 mL/min (ref >90)
GLUCOSE: 108 mg/dL — AB (ref 70–99)
Potassium: 4 mmol/L (ref 3.5–5.1)
Sodium: 138 mmol/L (ref 135–145)

## 2014-04-03 LAB — CBC
HCT: 27.8 % — ABNORMAL LOW (ref 36.0–46.0)
HEMOGLOBIN: 9.7 g/dL — AB (ref 12.0–15.0)
MCH: 31.1 pg (ref 26.0–34.0)
MCHC: 34.9 g/dL (ref 30.0–36.0)
MCV: 89.1 fL (ref 78.0–100.0)
Platelets: 224 10*3/uL (ref 150–400)
RBC: 3.12 MIL/uL — AB (ref 3.87–5.11)
RDW: 15.6 % — AB (ref 11.5–15.5)
WBC: 3.4 10*3/uL — ABNORMAL LOW (ref 4.0–10.5)

## 2014-04-03 LAB — MAGNESIUM: MAGNESIUM: 1.7 mg/dL (ref 1.5–2.5)

## 2014-04-03 NOTE — Progress Notes (Signed)
[facility] PROGRESS NOTE  Angel Price DXI:[id number] DOB: 10-Dec-1965 DOA: 04/01/2014 PCP: Angel Heater, MD  Assessment/Plan: 1. Acute gastroenteritis. Felt to be viral in etiology. Continue supportive treatment with antiemetics, IV fluids. Advance diet as tolerated. 2. Nausea and vomiting. Related to #1. Continue scheduled Zofran. 3. Gastroparesis. Likely worsened by #1. Continue Reglan. 4. Diarrhea. GI pathogen panel is in process. 5. Alcohol abuse. Currently no signs or trauma. Continue on CIWA protocol 6. Essential hypertension. Blood pressure stable 7. Abdominal wounds. Have been present for the last year. Patient is supposed to follow-up with plastic surgery at [facility].   Code Status: Full code Family Communication: Discussed with patient Disposition Plan: Discharge home once improved   Consultants:  Gastroenterology  Procedures:    Antibiotics:    HPI/Subjective: Tolerating clear liquids. Feels a little bit better. No vomiting. She does have nausea. No significant diarrhea  Objective: Filed Vitals:   04/03/14 1733  BP: 125/87  Pulse: 65  Temp: 98.1 F (36.7 C)  Resp: 20    Intake/Output Summary (Last 24 hours) at 04/03/14 1927 Last data filed at 04/03/14 1900  Gross per 24 hour  Intake 2026.25 ml  Output      1 ml  Net 2025.25 ml   Filed Weights   04/01/14 1245 04/02/14 0705 04/03/14 0538  Weight: 54.931 kg (121 lb 1.6 oz) 57.4 kg (126 lb 8.7 oz) 57.8 kg (127 lb 6.8 oz)    Exam:   General:  NAD  Cardiovascular: S1, S2 RRR  Respiratory: cta b  Abdomen: soft, diffusely tender, bs+  Musculoskeletal: no edema b/l   Data Reviewed: Basic Metabolic Panel:  Recent Labs Lab 04/01/14 1132 04/02/14 0537 04/03/14 0609  NA 134* 135 138  K 4.5 3.2* 4.0  CL 96 101 107  CO2 26 25 27   GLUCOSE 83 68* 108*  BUN 5* 8 <5*  CREATININE 0.54 0.56 0.48*  CALCIUM 9.3 8.8 8.8  MG  --   --  1.7   Liver Function Tests:  Recent  Labs Lab 04/01/14 1132 04/02/14 0537  AST 88* 56*  ALT 37* 29  ALKPHOS 84 66  BILITOT 1.0 1.2  PROT 7.6 6.2  ALBUMIN 4.3 3.6    Recent Labs Lab 04/01/14 1132  LIPASE 26   No results for input(s): AMMONIA in the last 168 hours. CBC:  Recent Labs Lab 04/01/14 1132 04/02/14 0537 04/03/14 0609  WBC 2.8* 3.8* 3.4*  NEUTROABS  --  1.4*  --   HGB 12.5 10.7* 9.7*  HCT 35.9* 30.4* 27.8*  MCV 86.7 88.1 89.1  PLT 241 233 224   Cardiac Enzymes: No results for input(s): CKTOTAL, CKMB, CKMBINDEX, TROPONINI in the last 168 hours. BNP (last 3 results) No results for input(s): PROBNP in the last 8760 hours. CBG: No results for input(s): GLUCAP in the last 168 hours.  No results found for this or any previous visit (from the past 240 hour(s)).   Studies: No results found.  Scheduled Meds: . enoxaparin (LOVENOX) injection  40 mg Subcutaneous Q24H  . fluticasone  1 spray Each Nare Daily  . folic acid  1 mg Oral Daily  . metoCLOPramide (REGLAN) injection  10 mg Intravenous 3 times per day  . multivitamin with minerals  1 tablet Oral Daily  . ondansetron (ZOFRAN) IV  4 mg Intravenous 4 times per day  . thiamine  100 mg Oral Daily   Or  . thiamine  100 mg Intravenous Daily   Continuous  Infusions: . sodium chloride 75 mL/hr at 04/03/14 1900    Principal Problem:   Gastroenteritis Active Problems:   Tobacco abuse   Alcohol abuse   Essential hypertension, benign   Nausea with vomiting   Diarrhea   Abdominal pain    Time spent: 53mins    Angel Price  [facility] Pager [phone]. If 7PM-7AM, please contact night-coverage at [email or web address], password [facility] 04/03/2014, 7:27 PM  LOS: 2 days

## 2014-04-03 NOTE — Progress Notes (Signed)
  Subjective:  Patient denies nausea or vomiting. She hasn't had a BM today. She is passing flatus. She complains of left-sided chest pain. She denies shortness of breath. She has cough but no expectoration. She remains with mid abdominal pain. She tells me that when abdominal wound was flushed with saline it came from the other site. Appetite is better.    Objective: Blood pressure 107/83, pulse 76, temperature 97.8 F (36.6 C), temperature source Oral, resp. rate 20, height 5\' 3"  (1.6 m), weight 127 lb 6.8 oz (57.8 kg), SpO2 100 %. Patient is alert. She is coughing intermittently. It is dry cough. Auscultation of lungs reveals vesicular breath sounds bilaterally. No rales rhonchi or rub noted. Abdomen. Bowel sounds are normal. 2 open areas noted with mucopurulent drainage one on either side of midline. Indurated mid abdominal wall. No organomegaly or masses. No LE edema or clubbing noted.  Labs/studies Results:   Recent Labs  04/01/14 1132 04/02/14 0537 04/03/14 0609  WBC 2.8* 3.8* 3.4*  HGB 12.5 10.7* 9.7*  HCT 35.9* 30.4* 27.8*  PLT 241 233 224    Differential revealed 40% neutrophils and 47% lymphocytes and 12% monocytes.   BMET   Recent Labs  04/01/14 1132 04/02/14 0537 04/03/14 0609  NA 134* 135 138  K 4.5 3.2* 4.0  CL 96 101 107  CO2 26 25 27   GLUCOSE 83 68* 108*  BUN 5* 8 <5*  CREATININE 0.54 0.56 0.48*  CALCIUM 9.3 8.8 8.8      Assessment:  #1. Acute gastroenteritis. Acute symptoms have resolved. GI pathogen panel is negative. She may have viral gastroenteritis given leukopenia and lymphocytosis. #2. Abdominal wall abscesses. She may have communication between 2 open areas.  #3. Mildly elevated transaminases most likely secondary to fatty liver #4. Anemia. Hemoglobin has dropped by 2.8 g since admission. No further evidence of hematemesis. She is also not having. melena or rectal bleeding. Some of the drop appears to be due to hydration. She had EGD on  02/03/2014 for nausea and vomiting and no ulcers identified. Colonoscopy is planned on an outpatient basis. #5. Left-sided chest pain. She does not have pleural rub or rales. Suspect pain is wall pain. Further workup per Dr. Roderic Palau.  Recommendations;  Advance diet to full liquids. Consider surgical consultation regarding abdominal wall abscesses and possible fistulous track. CBC and LFTs in a.m.

## 2014-04-04 LAB — CBC
HCT: 27.7 % — ABNORMAL LOW (ref 36.0–46.0)
HEMOGLOBIN: 9.6 g/dL — AB (ref 12.0–15.0)
MCH: 31.1 pg (ref 26.0–34.0)
MCHC: 34.7 g/dL (ref 30.0–36.0)
MCV: 89.6 fL (ref 78.0–100.0)
Platelets: 233 10*3/uL (ref 150–400)
RBC: 3.09 MIL/uL — AB (ref 3.87–5.11)
RDW: 15.7 % — ABNORMAL HIGH (ref 11.5–15.5)
WBC: 4.6 10*3/uL (ref 4.0–10.5)

## 2014-04-04 LAB — HEPATIC FUNCTION PANEL
ALT: 24 U/L (ref 0–35)
AST: 32 U/L (ref 0–37)
Albumin: 2.9 g/dL — ABNORMAL LOW (ref 3.5–5.2)
Alkaline Phosphatase: 52 U/L (ref 39–117)
Bilirubin, Direct: 0.1 mg/dL (ref 0.0–0.3)
Indirect Bilirubin: 0.4 mg/dL (ref 0.3–0.9)
Total Bilirubin: 0.5 mg/dL (ref 0.3–1.2)
Total Protein: 5.4 g/dL — ABNORMAL LOW (ref 6.0–8.3)

## 2014-04-04 MED ORDER — DIPHENHYDRAMINE HCL 50 MG/ML IJ SOLN: 12.5000 mg | Freq: Three times a day (TID) | INTRAMUSCULAR | Status: DC | PRN

## 2014-04-04 MED ORDER — METOCLOPRAMIDE HCL 5 MG/ML IJ SOLN
10.0000 mg | Freq: Three times a day (TID) | INTRAMUSCULAR | Status: DC
  Administered 2014-04-04 – 2014-04-06 (×8): 10 mg via INTRAVENOUS
  Filled 2014-04-04 (×8): qty 2

## 2014-04-04 MED ORDER — PANTOPRAZOLE SODIUM 40 MG PO TBEC
40.0000 mg | DELAYED_RELEASE_TABLET | Freq: Two times a day (BID) | ORAL | Status: DC
  Administered 2014-04-04 – 2014-04-06 (×5): 40 mg via ORAL
  Filled 2014-04-04 (×5): qty 1

## 2014-04-04 MED ORDER — DIPHENHYDRAMINE HCL 50 MG/ML IJ SOLN
12.5000 mg | Freq: Once | INTRAMUSCULAR | Status: AC
  Administered 2014-04-04: 12.5 mg via INTRAVENOUS
  Filled 2014-04-04: qty 1

## 2014-04-04 NOTE — Care Management Utilization Note (Signed)
UR completed 

## 2014-04-04 NOTE — Care Management Note (Addendum)
    Page 1 of 1   04/06/2014     11:38:15 AM CARE MANAGEMENT NOTE 04/06/2014  Patient:  MARCELLE, HEPNER   Account Number:  [id number]  Date Initiated:  04/04/2014  Documentation initiated by:  Jolene Provost  Subjective/Objective Assessment:   Pt admitted with gastroenteritis. Pt is from home, lives with parents. Pt is independent at baseline. Pt has no HH services, DME's or med needs prior to admission.     Action/Plan:   Pt plans to discharge home with self care. Pt does not have a PCP. Pt given list of PCP's in the area accepting new pt's. Will continue to follow for CM needs.   Anticipated DC Date:  04/04/2014   Anticipated DC Plan:  [place]  CM consult      Choice offered to / List presented to:             Status of service:  Completed, signed off Medicare Important Message given?  YES (If response is "NO", the following Medicare IM given date fields will be blank) Date Medicare IM given:  04/04/2014 Medicare IM given by:  Jolene Provost Date Additional Medicare IM given:   Additional Medicare IM given by:    Discharge Disposition:  HOME/SELF CARE  Per UR Regulation:    If discussed at Long Length of Stay Meetings, dates discussed:    Comments:  04/06/2014 [place], RN, MSN, [facility] Pt to be discharged home today with self care. No CM needs identified.  04/04/2014 [place], RN, MSN, [facility]

## 2014-04-04 NOTE — Progress Notes (Signed)
Patient ambulated in the hall without assistance and had no complaints. Will encourage patient to ambulate more throughout the day.

## 2014-04-04 NOTE — Progress Notes (Signed)
[facility] PROGRESS NOTE  Angel Price MWN:[id number] DOB: 05/30/1965 DOA: 04/01/2014 PCP: Doran Heater, MD  Assessment/Plan: 1. Acute gastroenteritis. Felt to be viral in etiology. Improving very slowly. Continue supportive treatment with antiemetics, IV fluids. Advance diet to soft food at dinner today. Will mobilize patient as well.  2. Nausea and vomiting. Related to #1. Continue scheduled Zofran. No vomiting in several days. 3. Gastroparesis. Likely worsened by #1. Continue Reglan. Dose adjusted by GI. Appreciate GI assistance. 4. Diarrhea. GI pathogen panel is in process. 5. Alcohol abuse. Currently no signs of withdrawal. Continue on CIWA protocol 6. Essential hypertension. Blood pressure stable 7. Abdominal wounds. Have been present for the last year. Patient is supposed to follow-up with plastic surgery at [facility].   Code Status: full Family Communication: none present Disposition Plan: home hopefully tomorrow   Consultants:  GI  Procedures:  none  Antibiotics:   none  HPI/Subjective: Lying in bed reports continued pain. Tolerating full liquids begrudgingly. No vomiting no darrhea  Objective: Filed Vitals:   04/04/14 0536  BP: 127/95  Pulse: 63  Temp: 98 F (36.7 C)  Resp: 20    Intake/Output Summary (Last 24 hours) at 04/04/14 1040 Last data filed at 04/04/14 0544  Gross per 24 hour  Intake   1440 ml  Output   1501 ml  Net    -61 ml   Filed Weights   04/02/14 0705 04/03/14 0538 04/04/14 0536  Weight: 57.4 kg (126 lb 8.7 oz) 57.8 kg (127 lb 6.8 oz) 59.875 kg (132 lb)    Exam:   General:  Appears comfortable  Cardiovascular: RRR no MGR no LE edema  Respiratory: normal effort BS clear bilaterally no wheeze no rhonchi  Abdomen: non-distended soft +BS mild diffuse tenderness to palpation  Musculoskeletal: no clubbing or cyanosis   Data Reviewed: Basic Metabolic Panel:  Recent Labs Lab 04/01/14 1132 04/02/14 0537  04/03/14 0609  NA 134* 135 138  K 4.5 3.2* 4.0  CL 96 101 107  CO2 26 25 27   GLUCOSE 83 68* 108*  BUN 5* 8 <5*  CREATININE 0.54 0.56 0.48*  CALCIUM 9.3 8.8 8.8  MG  --   --  1.7   Liver Function Tests:  Recent Labs Lab 04/01/14 1132 04/02/14 0537 04/04/14 0529  AST 88* 56* 32  ALT 37* 29 24  ALKPHOS 84 66 52  BILITOT 1.0 1.2 0.5  PROT 7.6 6.2 5.4*  ALBUMIN 4.3 3.6 2.9*    Recent Labs Lab 04/01/14 1132  LIPASE 26   No results for input(s): AMMONIA in the last 168 hours. CBC:  Recent Labs Lab 04/01/14 1132 04/02/14 0537 04/03/14 0609 04/04/14 0529  WBC 2.8* 3.8* 3.4* 4.6  NEUTROABS  --  1.4*  --   --   HGB 12.5 10.7* 9.7* 9.6*  HCT 35.9* 30.4* 27.8* 27.7*  MCV 86.7 88.1 89.1 89.6  PLT 241 233 224 233   Cardiac Enzymes: No results for input(s): CKTOTAL, CKMB, CKMBINDEX, TROPONINI in the last 168 hours. BNP (last 3 results) No results for input(s): PROBNP in the last 8760 hours. CBG: No results for input(s): GLUCAP in the last 168 hours.  No results found for this or any previous visit (from the past 240 hour(s)).   Studies: No results found.  Scheduled Meds: . enoxaparin (LOVENOX) injection  40 mg Subcutaneous Q24H  . fluticasone  1 spray Each Nare Daily  . folic acid  1 mg Oral Daily  . metoCLOPramide (REGLAN)  injection  10 mg Intravenous TID WC & HS  . multivitamin with minerals  1 tablet Oral Daily  . ondansetron (ZOFRAN) IV  4 mg Intravenous 4 times per day  . pantoprazole  40 mg Oral BID AC  . thiamine  100 mg Oral Daily   Or  . thiamine  100 mg Intravenous Daily   Continuous Infusions: . sodium chloride 75 mL/hr at 04/04/14 0410    Principal Problem:   Gastroenteritis Active Problems:   Tobacco abuse   Alcohol abuse   Essential hypertension, benign   Nausea with vomiting   Diarrhea   Abdominal pain    Time spent: 35 minutes    [facility] Pager [phone]. If 7PM-7AM, please contact night-coverage at  [email or web address], password [facility] 04/04/2014, 10:40 AM  LOS: 3 days

## 2014-04-04 NOTE — Progress Notes (Addendum)
    Subjective: Still some vomiting but overall improved from admission. Tolerating clear liquids but unable to advance to fulls. Diarrhea X 2 yesterday now resolved. Lower abdominal pain constant but "gets better with pain medicine".   Objective: Vital signs in last 24 hours: Temp:  [97.7 F (36.5 C)-98.2 F (36.8 C)] 98 F (36.7 C) (01/18 0536) Pulse Rate:  [63-76] 63 (01/18 0536) Resp:  [20] 20 (01/18 0536) BP: (107-127)/(83-95) 127/95 mmHg (01/18 0536) SpO2:  [100 %] 100 % (01/18 0536) Weight:  [132 lb (59.875 kg)] 132 lb (59.875 kg) (01/18 0536) Last BM Date: 04/03/14 General:   Alert and oriented, pleasant Head:  Normocephalic and atraumatic. Eyes:  No icterus, sclera clear. Conjuctiva pink.  Mouth:  Without lesions, mucosa pink and moist.   Abdomen:  Bowel sounds present, soft, mild TTP lower abdomen, chronic wounds on bilateral sides of abdomen, covered with gauze.  Extremities:  Without  edema. Neurologic:  Alert and  oriented x4;  grossly normal neurologically. Psych:  Alert and cooperative. Normal mood and affect.  Intake/Output from previous day: 01/17 0701 - 01/18 0700 In: 1680 [P.O.:720; I.V.:960] Out: 1501 [Urine:1500; Emesis/NG output:1] Intake/Output this shift:    Lab Results:  Recent Labs  04/02/14 0537 04/03/14 0609 04/04/14 0529  WBC 3.8* 3.4* 4.6  HGB 10.7* 9.7* 9.6*  HCT 30.4* 27.8* 27.7*  PLT 233 224 233   BMET  Recent Labs  04/01/14 1132 04/02/14 0537 04/03/14 0609  NA 134* 135 138  K 4.5 3.2* 4.0  CL 96 101 107  CO2 26 25 27   GLUCOSE 83 68* 108*  BUN 5* 8 <5*  CREATININE 0.54 0.56 0.48*  CALCIUM 9.3 8.8 8.8   LFT  Recent Labs  04/01/14 1132 04/02/14 0537 04/04/14 0529  PROT 7.6 6.2 5.4*  ALBUMIN 4.3 3.6 2.9*  AST 88* 56* 32  ALT 37* 29 24  ALKPHOS 84 66 52  BILITOT 1.0 1.2 0.5  BILIDIR  --   --  0.1  IBILI  --   --  0.4    Assessment/Plan: 49 year old admitted with likely acute viral gastroenteritis, with past  history to include chronic abdominal pain and gastroparesis. Clinically improving from admission but unable to advance to full liquids; appears Reglan is not schedule to be given with meals. Will adjust Reglan to be taken with meals and continue scheduled Zofran.    Diarrhea: improved since admission. GI pathogen panel in process. Outpatient colonoscopy as planned.   Anemia: 3 gram drop since admission without further evidence of hematemesis. EGD up-to-date. Plan on colonoscopy as outpatient. Likely multifactorial.   Chronic abdominal pain: may need referral to pain management as outpatient.  N/V: secondary to gastroenteritis compounded by underlying gastroparesis. Schedule Reglan with meals. Zofran scheduled. Add PPI BID. Attempt to increase to full liquids as tolerated.   Orvil Feil, ANP-BC [facility]     LOS: 3 days    04/04/2014, 8:07 AM  Attending note:  Patient seen and examined. Agree with above assessment and recommendations.

## 2014-04-05 ENCOUNTER — Inpatient Hospital Stay (HOSPITAL_COMMUNITY): Payer: Medicare Other

## 2014-04-05 LAB — CBC
HEMATOCRIT: 29.5 % — AB (ref 36.0–46.0)
Hemoglobin: 10.1 g/dL — ABNORMAL LOW (ref 12.0–15.0)
MCH: 30.4 pg (ref 26.0–34.0)
MCHC: 34.2 g/dL (ref 30.0–36.0)
MCV: 88.9 fL (ref 78.0–100.0)
Platelets: 240 10*3/uL (ref 150–400)
RBC: 3.32 MIL/uL — ABNORMAL LOW (ref 3.87–5.11)
RDW: 15.7 % — ABNORMAL HIGH (ref 11.5–15.5)
WBC: 4.9 10*3/uL (ref 4.0–10.5)

## 2014-04-05 LAB — BASIC METABOLIC PANEL
ANION GAP: 6 (ref 5–15)
CALCIUM: 9 mg/dL (ref 8.4–10.5)
CHLORIDE: 108 meq/L (ref 96–112)
CO2: 26 mmol/L (ref 19–32)
Creatinine, Ser: 0.45 mg/dL — ABNORMAL LOW (ref 0.50–1.10)
GFR calc non Af Amer: >90 mL/min (ref >90)
GLUCOSE: 97 mg/dL (ref 70–99)
POTASSIUM: 3.4 mmol/L — AB (ref 3.5–5.1)
Sodium: 140 mmol/L (ref 135–145)

## 2014-04-05 LAB — MRSA PCR SCREENING: MRSA BY PCR: NEGATIVE

## 2014-04-05 MED ORDER — LISINOPRIL 10 MG PO TABS
10.0000 mg | ORAL_TABLET | Freq: Every day | ORAL | Status: DC
  Administered 2014-04-05 – 2014-04-06 (×2): 10 mg via ORAL
  Filled 2014-04-05 (×2): qty 1

## 2014-04-05 MED ORDER — ONDANSETRON HCL 4 MG/2ML IJ SOLN
4.0000 mg | Freq: Three times a day (TID) | INTRAMUSCULAR | Status: DC
  Administered 2014-04-05 – 2014-04-06 (×2): 4 mg via INTRAVENOUS
  Filled 2014-04-05 (×2): qty 2

## 2014-04-05 MED ORDER — POTASSIUM CHLORIDE CRYS ER 20 MEQ PO TBCR
40.0000 meq | EXTENDED_RELEASE_TABLET | Freq: Once | ORAL | Status: AC
  Administered 2014-04-05: 40 meq via ORAL
  Filled 2014-04-05: qty 2

## 2014-04-05 NOTE — Progress Notes (Signed)
    Subjective: Able to tolerate liquids only. Unable to tolerate lunch. Tried to eat dinner and states "it took me all night", then threw up. Feels like food sits heavy in epigastric region. No further diarrhea.   Objective: Vital signs in last 24 hours: Temp:  [98.1 F (36.7 C)-98.2 F (36.8 C)] 98.2 F (36.8 C) (01/19 0550) Pulse Rate:  [65-108] 69 (01/19 0550) Resp:  [18-20] 20 (01/19 0550) BP: (119-164)/(92-104) 164/104 mmHg (01/19 0550) SpO2:  [98 %-100 %] 100 % (01/19 0550) Weight:  [129 lb 14.4 oz (58.922 kg)] 129 lb 14.4 oz (58.922 kg) (01/19 0550) Last BM Date: 04/03/14 General:   Alert and oriented, pleasant Head:  Normocephalic and atraumatic. Abdomen:  Bowel sounds present, soft, TTP epigastric region. Surgical scars noted with gauze bilaterally to chronic wounds Psych:  Alert and cooperative. Normal mood and affect.  Intake/Output from previous day: 01/18 0701 - 01/19 0700 In: 1957.5 [P.O.:700; I.V.:1257.5] Out: 1400 [Urine:1400] Intake/Output this shift:    Lab Results:  Recent Labs  04/03/14 0609 04/04/14 0529 04/05/14 0530  WBC 3.4* 4.6 4.9  HGB 9.7* 9.6* 10.1*  HCT 27.8* 27.7* 29.5*  PLT 224 233 240   BMET  Recent Labs  04/03/14 0609 04/05/14 0530  NA 138 140  K 4.0 3.4*  CL 107 108  CO2 27 26  GLUCOSE 108* 97  BUN <5* <5*  CREATININE 0.48* 0.45*  CALCIUM 8.8 9.0   LFT  Recent Labs  04/04/14 0529  PROT 5.4*  ALBUMIN 2.9*  AST 32  ALT 24  ALKPHOS 52  BILITOT 0.5  BILIDIR 0.1  IBILI 0.4    Assessment/Plan: 49 year old admitted with likely acute viral gastroenteritis, with past history to include chronic abdominal pain and gastroparesis. Clinically improving from admission but unable to advance from clear liquids to solid foods despite Reglan adjusted to be given prior to meals and scheduled Zofran. With Billroth II anatomy, unable to exclude occult stricture at the anastomosis. EGD on Nov 2015 unremarkable. Consider BPE vs repeat  EGD.   Diarrhea: improved/resolved since admission. GI pathogen panel in process. Outpatient colonoscopy as planned.   Anemia: documented 3 gram drop since admission without further evidence of hematemesis. EGD up-to-date as of Nov 2015. Plan on colonoscopy as outpatient. Likely multifactorial. No further evidence of overt GI bleeding.   Chronic abdominal pain: may need referral to pain management as outpatient.  N/V: secondary to gastroenteritis compounded by underlying gastroparesis. Continue scheduled Reglan with meals. Zofran scheduled. Continue PPI BID. If unable to tolerate diet, may need repeat EGD vs BPE. To discuss with Dr. Oneida Alar.   Orvil Feil, ANP-BC [facility]      LOS: 4 days    04/05/2014, 7:58 AM

## 2014-04-05 NOTE — Progress Notes (Signed)
[facility] PROGRESS NOTE  Angel Price JIR:[id number] DOB: Jan 02, 1966 DOA: 04/01/2014 PCP: Doran Heater, MD  Assessment/Plan: 1. Acute gastroenteritis. Felt to be viral in etiology. Vomited after eating dinner last night. Tolerating liquids only. Continues with abdominal pain. Continue supportive treatment with antiemetics, reglan and IV fluids. Evaluated by GI who has some concerns for stricture. Considering BPE vs EGD. Of note, EGD 01/2014 unremarkable.   2. Nausea and vomiting. Related to #1. Continue scheduled Zofran. See above 3. Gastroparesis. Likely worsened by #1. Continue Reglan. Dose adjusted by GI. Appreciate GI assistance. 4. Diarrhea. GI pathogen panel is in process. 5. Alcohol abuse. Currently no signs of withdrawal. Continue on CIWA protocol 6. Essential hypertension. Blood pressure stable 7. Abdominal wounds. Have been present for the last year. Patient is supposed to follow-up with plastic surgery at [facility].   Code Status: full Family Communication: none present Disposition Plan: home when able   Consultants:  GI  Procedures:  none  Antibiotics:   none  HPI/Subjective: Reports emesis after solid food. States "feels like does no go all way down"  Objective: Filed Vitals:   04/05/14 0913  BP: 130/87  Pulse:   Temp:   Resp:     Intake/Output Summary (Last 24 hours) at 04/05/14 1137 Last data filed at 04/05/14 0800  Gross per 24 hour  Intake 1957.5 ml  Output   1900 ml  Net   57.5 ml   Filed Weights   04/03/14 0538 04/04/14 0536 04/05/14 0550  Weight: 57.8 kg (127 lb 6.8 oz) 59.875 kg (132 lb) 58.922 kg (129 lb 14.4 oz)    Exam:   :  Well nourished appears comfortable  Cardiovascular: RRR no MGR No LE edema  Respiratory: normal effort BS clear bilaterally no wheeze  Abdomen: non-distended soft very sluggish BS. Diffuse tenderness to palpation  Musculoskeletal:  Joints without swelling/erythema  Data  Reviewed: Basic Metabolic Panel:  Recent Labs Lab 04/01/14 1132 04/02/14 0537 04/03/14 0609 04/05/14 0530  NA 134* 135 138 140  K 4.5 3.2* 4.0 3.4*  CL 96 101 107 108  CO2 26 25 27 26   GLUCOSE 83 68* 108* 97  BUN 5* 8 <5* <5*  CREATININE 0.54 0.56 0.48* 0.45*  CALCIUM 9.3 8.8 8.8 9.0  MG  --   --  1.7  --    Liver Function Tests:  Recent Labs Lab 04/01/14 1132 04/02/14 0537 04/04/14 0529  AST 88* 56* 32  ALT 37* 29 24  ALKPHOS 84 66 52  BILITOT 1.0 1.2 0.5  PROT 7.6 6.2 5.4*  ALBUMIN 4.3 3.6 2.9*    Recent Labs Lab 04/01/14 1132  LIPASE 26   No results for input(s): AMMONIA in the last 168 hours. CBC:  Recent Labs Lab 04/01/14 1132 04/02/14 0537 04/03/14 0609 04/04/14 0529 04/05/14 0530  WBC 2.8* 3.8* 3.4* 4.6 4.9  NEUTROABS  --  1.4*  --   --   --   HGB 12.5 10.7* 9.7* 9.6* 10.1*  HCT 35.9* 30.4* 27.8* 27.7* 29.5*  MCV 86.7 88.1 89.1 89.6 88.9  PLT 241 233 224 233 240   Cardiac Enzymes: No results for input(s): CKTOTAL, CKMB, CKMBINDEX, TROPONINI in the last 168 hours. BNP (last 3 results) No results for input(s): PROBNP in the last 8760 hours. CBG: No results for input(s): GLUCAP in the last 168 hours.  No results found for this or any previous visit (from the past 240 hour(s)).   Studies: No results found.  Scheduled  Meds: . enoxaparin (LOVENOX) injection  40 mg Subcutaneous Q24H  . fluticasone  1 spray Each Nare Daily  . folic acid  1 mg Oral Daily  . lisinopril  10 mg Oral Daily  . metoCLOPramide (REGLAN) injection  10 mg Intravenous TID WC & HS  . multivitamin with minerals  1 tablet Oral Daily  . ondansetron (ZOFRAN) IV  4 mg Intravenous 4 times per day  . pantoprazole  40 mg Oral BID AC  . potassium chloride  40 mEq Oral Once  . thiamine  100 mg Oral Daily   Or  . thiamine  100 mg Intravenous Daily   Continuous Infusions: . sodium chloride 50 mL/hr at 04/05/14 1136    Principal Problem:   Gastroenteritis Active  Problems:   Tobacco abuse   Alcohol abuse   Essential hypertension, benign   Nausea with vomiting   Diarrhea   Abdominal pain    Time spent: [facility] Pager [phone]. If 7PM-7AM, please contact night-coverage at [email or web address], password [facility] 04/05/2014, 11:37 AM  LOS: 4 days

## 2014-04-06 ENCOUNTER — Encounter: Payer: Self-pay | Admitting: Gastroenterology

## 2014-04-06 ENCOUNTER — Encounter: Payer: Self-pay | Admitting: Internal Medicine

## 2014-04-06 DIAGNOSIS — K3184 Gastroparesis: Secondary | ICD-10-CM | POA: Insufficient documentation

## 2014-04-06 LAB — BASIC METABOLIC PANEL
Anion gap: 4 — ABNORMAL LOW (ref 5–15)
BUN: 7 mg/dL (ref 6–23)
CHLORIDE: 105 meq/L (ref 96–112)
CO2: 30 mmol/L (ref 19–32)
Calcium: 9.1 mg/dL (ref 8.4–10.5)
Creatinine, Ser: 0.49 mg/dL — ABNORMAL LOW (ref 0.50–1.10)
GFR calc Af Amer: >90 mL/min (ref >90)
Glucose, Bld: 96 mg/dL (ref 70–99)
Potassium: 4.3 mmol/L (ref 3.5–5.1)
Sodium: 139 mmol/L (ref 135–145)

## 2014-04-06 MED ORDER — HYDROCODONE-ACETAMINOPHEN 5-325 MG PO TABS: 1.0000 | ORAL_TABLET | Freq: Four times a day (QID) | ORAL | Status: DC | PRN

## 2014-04-06 MED ORDER — METOCLOPRAMIDE HCL 10 MG PO TABS
10.0000 mg | ORAL_TABLET | Freq: Three times a day (TID) | ORAL | Status: DC
Start: 2014-04-06 — End: 2015-10-08

## 2014-04-06 MED ORDER — METOCLOPRAMIDE HCL 10 MG PO TABS: 10.0000 mg | ORAL_TABLET | Freq: Three times a day (TID) | ORAL | Status: DC

## 2014-04-06 NOTE — Progress Notes (Signed)
Subjective: Tolerated food yesterday, vomiting X 3 overnight. Continues to complain of chronic abdominal pain lower abdomen. No diarrhea.   Objective: Vital signs in last 24 hours: Temp:  [97.9 F (36.6 C)-98.4 F (36.9 C)] 98.4 F (36.9 C) (01/20 0556) Pulse Rate:  [70-86] 86 (01/20 0556) Resp:  [20] 20 (01/20 0556) BP: (126-173)/(84-108) 173/108 mmHg (01/20 0556) SpO2:  [99 %-100 %] 100 % (01/20 0556) Weight:  [132 lb 4.8 oz (60.011 kg)] 132 lb 4.8 oz (60.011 kg) (01/20 0556) Last BM Date: 04/03/14 General:   Alert and oriented, flat affect Head:  Normocephalic and atraumatic. Eyes:  No icterus, sclera clear. Conjuctiva pink.  Abdomen:  Bowel sounds present, soft, tender to palpation lower abdomen and bilateral sides just laterally to midline incision. gauze covering areas of chronic drainage Extremities:  Without edema. Neurologic:  Alert and  oriented x4;  grossly normal neurologically.   Intake/Output from previous day: 01/19 0701 - 01/20 0700 In: 1912.5 [P.O.:720; I.V.:1192.5] Out: 1850 [Urine:1850] Intake/Output this shift:    Lab Results:  Recent Labs  04/04/14 0529 04/05/14 0530  WBC 4.6 4.9  HGB 9.6* 10.1*  HCT 27.7* 29.5*  PLT 233 240   BMET  Recent Labs  04/05/14 0530 04/06/14 0508  NA 140 139  K 3.4* 4.3  CL 108 105  CO2 26 30  GLUCOSE 97 96  BUN <5* 7  CREATININE 0.45* 0.49*  CALCIUM 9.0 9.1   LFT  Recent Labs  04/04/14 0529  PROT 5.4*  ALBUMIN 2.9*  AST 32  ALT 24  ALKPHOS 52  BILITOT 0.5  BILIDIR 0.1  IBILI 0.4     Studies/Results: Dg Ugi  W/kub  04/05/2014   CLINICAL DATA:  Postprandial vomiting, history of Billroth II gastrojejunostomy, question anastomotic stricture  EXAM: UPPER GI SERIES WITH KUB  TECHNIQUE: After obtaining a scout radiograph a routine upper GI series was performed using thin barium  FLUOROSCOPY TIME:  1 min 36 seconds  COMPARISON:  CT abdomen and pelvis 03/02/2014  FINDINGS: Normal esophageal  distention and motility.  No esophageal mass or stricture.  No gastroesophageal reflux or hiatal hernia seen during exam.  Post antrectomy with gastrojejunostomy.  Stomach distends normally without gross mass or ulceration.  Patent gastrojejunostomy anastomosis with free spillage of contrast from the stomach into normal-appearing proximal jejunal loops.  Emptying is clearly facilitated by upright positioning rather than supine.  Visualize jejunal loops normal in appearance with normal diameter and mucosal fold pattern.  No persistent intraluminal filling defects.  No anastomotic stricture or ulcer identified.  Normal bowel gas pattern on scout image.  Osseous demineralization.  IMPRESSION: Post Billroth II antrectomy and gastrojejunostomy.  Widely patent gastrojejunostomy anastomosis without anastomotic stricture or ulcer.  No acute abnormalities identified.   Electronically Signed   By: Lavonia Dana M.D.   On: 04/05/2014 14:11    Assessment/Plan: 49 year old admitted with likely acute viral gastroenteritis, with past history to include chronic abdominal pain, gastroparesis, ETOH abuse. Quite slow to improve from admission but did tolerate a bariatric diet yesterday; with Billroth II anatomy, concern for anastomotic stricture raised due to difficulty of diet advancement, but UGI 1/19 revealed widely patent anastomosis. Recent EGD on file from Nov 2015.     Diarrhea: improved/resolved since admission. GI pathogen panel negative. Outpatient colonoscopy as planned.  Anemia: documented drop since admission, now stable, without further evidence of hematemesis. EGD up-to-date as of Nov 2015. Plan on colonoscopy as outpatient. Likely multifactorial. No further evidence of  overt GI bleeding.   Chronic abdominal pain: with multiple imaging studies on file, 7 CTs in 2015. Recommend referral to pain management as outpatient. Prior imaging studies reviewed with radiology in the past and negative for internal hernia.  With clinical signs/symptoms, low likelihood of this playing a role.   N/V: secondary to gastroenteritis compounded by underlying gastroparesis. Continue scheduled Reglan with meals. Zofran scheduled. Continue PPI BID. Consider adding Marinol BID.   Hopeful discharge today or next 24 hours if she continues to tolerate her diet. Recommend ETOH abstinence and close outpatient follow-up. Recommend transitioning from IV pain medication to oral only.   Orvil Feil, ANP-BC [facility]     LOS: 5 days    04/06/2014, 7:47 AM

## 2014-04-06 NOTE — Progress Notes (Deleted)
Patient seen, independently examined and chart reviewed. I agree with exam, assessment and plan discussed with Dyanne Carrel, NP.  49 year old woman with history of peptic ulcer disease, Billroth II, chronic abdominal pain, gastroparesis and alcohol abuse directly admitted from the gastroenterology office for intractable nausea and vomiting, diarrhea and abdominal pain. She was admitted for gastroenteritis, nausea and vomiting, acute on chronic abdominal pain. She was true supportive care, seen by GI in consultation, viral gastroenteritis was suspected given leukopenia. Mild elevation of serum transaminases thought to be secondary to fatty liver. Anemia with acute drop thought to be secondary to hydration. EGD November 2015 was unremarkable. Colonoscopy planned on an outpatient basis. GI recommended scheduling Reglan with meals. She had difficulty advancing to solids and upper GI series was checked which was unremarkable showing patent anastomosis. GI suggested outpatient follow-up with pain management for chronic abdominal pain with multiple previous CTs and 2015. Recommend continuing scheduled Reglan with meals.  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 She tolerated a solid diet. No vomiting. Chronic abdominal pain stable. She wants to go home. She is already dressed  Objective: Afebrile, vital signs stable. General: Appears comfortable, calm. Cardiovascular: Regular rate and rhythm, no murmur, rub or gallop. No lower extremity edema. Respiratory: Clear to auscultation bilaterally, no wheezes, rales or rhonchi. Normal respiratory effort. Abdomen: soft, ntnd Psychiatric: grossly normal mood and affect, speech fluent and appropriate   GI pathogen panel negative  Basic metabolic panel  unremarkable. LFTs unremarkable 1/18.  Hemoglobin stable 10.1.  She is tolerating a diet, acute issues have resolved, GI pathogen panel was negative. Plan for discharge home today with recommendations as per GI including scheduled Reglan. We discussed problems this medication can cause including movement problems. She knows to seek evaluation for side effects.  Severe malnutrition in the context of chronic illness noted.  Of note the patient has listed allergies to codeine and morphine. However she reports she can take hydrocodone without incident.  Murray Hodgkins, MD [facility] [phone]

## 2014-04-06 NOTE — Progress Notes (Signed)
Pt ambulating in hallway, independently.  Tolerating well.  Ambulated approximately 250 feet.

## 2014-04-06 NOTE — Progress Notes (Signed)
Pt discharged home today per Dr. Sarajane Jews. Pt's IV site D/C'd and WDL. Pt's VSS. Pt provided with home medication list, discharge instructions and prescriptions. Verbalized understanding. Pt let floor via WC in stable condition accompanied by RN.

## 2014-04-06 NOTE — Progress Notes (Signed)
APPOINTMENT MADE AND PATIENT AWARE. °

## 2014-04-06 NOTE — Discharge Summary (Signed)
Physician Discharge Summary  Angel Price ZOX:[id number] DOB: 01-01-1966 DOA: 04/01/2014  PCP: Doran Heater, MD  Admit date: 04/01/2014 Discharge date: 04/06/2014  Time spent: 40 minutes  Recommendations for Outpatient Follow-up:  1. Follow up with PCP in 1-2 weeks for evaluation of chronic abdominal pain and possible referral to pain clinic.  2. Advance diet as tolerated 3. Follow up with plastic surgery at [facility] as arranged  Discharge Diagnoses:  Principal Problem:   Gastroenteritis Active Problems:   Tobacco abuse   Alcohol abuse   Essential hypertension, benign   Nausea with vomiting   Diarrhea   Abdominal pain   Gastroparesis   Discharge Condition: stable  Diet recommendation: soft (small portions) advance as tolerated  Filed Weights   04/04/14 0536 04/05/14 0550 04/06/14 0556  Weight: 59.875 kg (132 lb) 58.922 kg (129 lb 14.4 oz) 60.011 kg (132 lb 4.8 oz)    History of present illness:  Angel Price is a 49 y.o. 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 Initial evaluation revealrf tachycardia and mild hypertension.  She reportrf for the previous 5 days gradual worsening of abdominal pain, frequent watery stools and intractable nausea and vomiting. She reported an inability to keep even water down. Reported virtually no by mouth intake in the previoius 4 days. She also reported some "red flex" in her emesis but stated emesis  mostly "foam".she reported small amount blood on toilet paper. Associated symptoms included palpitations headache and intermittent dizziness. She described the abdominal pain as a sharp cramp-like particularly  around bowel movements that was different from her usual chronic pain in its location and intensity. Denied fever chills, sob. She also reported completing coarse of Levaquin 3 weeks prior for pna. Reported having had 3 beers yesterday and only consumed 3-4 in the previous 5 days but typically consumes 6-12 beers daily.  At the time of my exam she was afebrile mildly tachycardic with a heart rate of 12 and a blood pressure 129/95, she is not hypoxic.   Hospital Course:  1. Acute gastroenteritis. Felt to be viral in etiology. Provided with supportive therapy and slowly improved. Evaluated by GI who had some concerns for stricture so UGI done 04/05/14 and revealed widely patent anastomosis. Of note, EGD 01/2014 unremarkable. recommended discharge with reglan, abstinence from ETOH. Recommend close OP follow up with PCP for evaluation and possible referral to pain management clinic. 2. Nausea and vomiting. Related to #1. Tolerating soft small portions diet.  3. Gastroparesis. Likely worsened by #1. See #1. Tolerating diet at discharge 4. Diarrhea. GI pathogen panel negative.  5. Alcohol abuse. No signs of withdrawal. Recommend abstinence  6. Essential hypertension. Blood pressure remained stable.  7. Abdominal wounds. Have been present for the last year. Patient scheduled to follow-up with plastic surgery at [facility].    Procedures:  UGI 04/05/14  Consultations:  GI  Discharge Exam: Filed Vitals:   04/06/14 0824  BP: 137/91  Pulse: 75  Temp:   Resp:     General: well nourished ambulating in hall with steady gait Cardiovascular: RRR no MGR No LE edema Respiratory: normal effort BS clear bilaterally Abdomen: non-distended soft +BS   Discharge Instructions    Current Discharge Medication List  START taking these medications   Details  HYDROcodone-acetaminophen (NORCO/VICODIN) 5-325 MG per tablet Take 1 tablet by mouth every 6 (six) hours as needed for severe pain. Qty:  12 tablet, Refills: 0    metoCLOPramide (REGLAN) 10 MG tablet Take 1 tablet (10 mg total) by mouth 4 (four) times daily -  before meals and at bedtime. Qty: 90 tablet, Refills: 1      CONTINUE these medications which have NOT CHANGED   Details  ARIPiprazole (ABILIFY) 5 MG tablet Take 1 tablet (5 mg total) by mouth 2 (two) times daily. Qty: 60 tablet, Refills: 2    escitalopram (LEXAPRO) 20 MG tablet Take 1 tablet (20 mg total) by mouth daily. Qty: 30 tablet, Refills: 2    fluticasone (FLONASE) 50 MCG/ACT nasal spray Place 1 spray into both nostrils daily.    lisinopril (PRINIVIL,ZESTRIL) 10 MG tablet Take 10 mg by mouth daily.    mupirocin ointment (BACTROBAN) 2 % Place into the nose 2 (two) times daily. Qty: 22 g, Refills: 0    ondansetron (ZOFRAN) 4 MG tablet Take 1 tablet (4 mg total) by mouth 3 (three) times daily. With meals Qty: 90 tablet, Refills: 1    pantoprazole (PROTONIX) 40 MG tablet Take 1 tablet (40 mg total) by mouth 2 (two) times daily before a meal. Qty: 60 tablet, Refills: 3    promethazine (PHENERGAN) 25 MG tablet Take 1 tablet (25 mg total) by mouth every 6 (six) hours as needed. Qty: 20 tablet, Refills: 0    sucralfate (CARAFATE) 1 G tablet Take 1 g by mouth 4 (four) times daily.       Allergies  Allergen Reactions  . Codeine Hives  . Morphine And Related Hives    gastritis   Follow-up Information    Follow up with Doran Heater, MD. Schedule an appointment as soon as possible for a visit in 1 week.   Specialty:  Family Medicine   Why:  for evaluation of abdominal pain and possible referral to pain management clinic       The results of significant diagnostics from this hospitalization (including imaging, microbiology, ancillary and laboratory) are listed below for reference.    Significant Diagnostic Studies: Dg Chest 2 View  03/14/2014   CLINICAL DATA:  Wheezing and shortness of breath. Mid chest pain radiating down the left arm. Chronic  productive cough with milky white sputum. History of hypertension.  EXAM: CHEST  2 VIEW  COMPARISON:  03/02/2014 chest CT and plain film  FINDINGS: Heart size is normal. There is persistent patchy density at the right lung base. Minimal left lower lobe atelectasis. No focal consolidations. No pulmonary edema. Surgical clips are present in the upper abdomen. Visualized osseous structures have a normal appearance.  IMPRESSION: 1. Persistent right lower lobe infiltrate. 2. Minimal left lower lobe atelectasis.   Electronically Signed   By: Shon Hale M.D.   On: 03/14/2014 11:26   Dg Duanne Limerick  W/kub  04/05/2014   CLINICAL DATA:  Postprandial vomiting, history of Billroth II gastrojejunostomy, question anastomotic stricture  EXAM: UPPER GI SERIES WITH KUB  TECHNIQUE: After obtaining a scout radiograph a routine upper GI series was performed using thin barium  FLUOROSCOPY TIME:  1 min 36 seconds  COMPARISON:  CT abdomen and pelvis 03/02/2014  FINDINGS: Normal esophageal distention and motility.  No esophageal mass or stricture.  No gastroesophageal reflux or hiatal hernia seen during exam.  Post antrectomy with gastrojejunostomy.  Stomach distends normally without gross mass  or ulceration.  Patent gastrojejunostomy anastomosis with free spillage of contrast from the stomach into normal-appearing proximal jejunal loops.  Emptying is clearly facilitated by upright positioning rather than supine.  Visualize jejunal loops normal in appearance with normal diameter and mucosal fold pattern.  No persistent intraluminal filling defects.  No anastomotic stricture or ulcer identified.  Normal bowel gas pattern on scout image.  Osseous demineralization.  IMPRESSION: Post Billroth II antrectomy and gastrojejunostomy.  Widely patent gastrojejunostomy anastomosis without anastomotic stricture or ulcer.  No acute abnormalities identified.   Electronically Signed   By: Lavonia Dana M.D.   On: 04/05/2014 14:11    Microbiology: Recent  Results (from the past 240 hour(s))  MRSA PCR Screening     Status: None   Collection Time: 04/05/14  8:30 PM  Result Value Ref Range Status   MRSA by PCR NEGATIVE NEGATIVE Final    Comment:        The GeneXpert MRSA Assay (FDA approved for NASAL specimens only), is one component of a comprehensive MRSA colonization surveillance program. It is not intended to diagnose MRSA infection nor to guide or monitor treatment for MRSA infections.      Labs: Basic Metabolic Panel:  Recent Labs Lab 04/01/14 1132 04/02/14 0537 04/03/14 0609 04/05/14 0530 04/06/14 0508  NA 134* 135 138 140 139  K 4.5 3.2* 4.0 3.4* 4.3  CL 96 101 107 108 105  CO2 26 25 27 26 30   GLUCOSE 83 68* 108* 97 96  BUN 5* 8 <5* <5* 7  CREATININE 0.54 0.56 0.48* 0.45* 0.49*  CALCIUM 9.3 8.8 8.8 9.0 9.1  MG  --   --  1.7  --   --    Liver Function Tests:  Recent Labs Lab 04/01/14 1132 04/02/14 0537 04/04/14 0529  AST 88* 56* 32  ALT 37* 29 24  ALKPHOS 84 66 52  BILITOT 1.0 1.2 0.5  PROT 7.6 6.2 5.4*  ALBUMIN 4.3 3.6 2.9*    Recent Labs Lab 04/01/14 1132  LIPASE 26   No results for input(s): AMMONIA in the last 168 hours. CBC:  Recent Labs Lab 04/01/14 1132 04/02/14 0537 04/03/14 0609 04/04/14 0529 04/05/14 0530  WBC 2.8* 3.8* 3.4* 4.6 4.9  NEUTROABS  --  1.4*  --   --   --   HGB 12.5 10.7* 9.7* 9.6* 10.1*  HCT 35.9* 30.4* 27.8* 27.7* 29.5*  MCV 86.7 88.1 89.1 89.6 88.9  PLT 241 233 224 233 240   Cardiac Enzymes: No results for input(s): CKTOTAL, CKMB, CKMBINDEX, TROPONINI in the last 168 hours. BNP: BNP (last 3 results) No results for input(s): PROBNP in the last 8760 hours. CBG: No results for input(s): GLUCAP in the last 168 hours.     SignedRadene Gunning  [facility] 04/06/2014, 11:45 AM

## 2014-04-06 NOTE — Progress Notes (Unsigned)
Please have patient follow-up with [place] in 4 weeks, hospital follow-up.

## 2014-04-06 NOTE — Progress Notes (Signed)
cc'ed to pcp °

## 2014-04-07 ENCOUNTER — Telehealth: Payer: Self-pay

## 2014-04-07 NOTE — Telephone Encounter (Signed)
Pt called office and was wanting to know about her pain management referral. No referral is noted in computer.

## 2014-04-11 ENCOUNTER — Other Ambulatory Visit: Payer: Self-pay

## 2014-04-11 DIAGNOSIS — R109 Unspecified abdominal pain: Secondary | ICD-10-CM

## 2014-04-11 NOTE — Telephone Encounter (Signed)
Noted and referral has been made. Called pt and LMOM.

## 2014-04-11 NOTE — Telephone Encounter (Signed)
Yes, refer to Pain Management.

## 2014-04-24 ENCOUNTER — Encounter (HOSPITAL_COMMUNITY): Payer: Self-pay | Admitting: Emergency Medicine

## 2014-04-24 ENCOUNTER — Inpatient Hospital Stay (HOSPITAL_COMMUNITY)
Admission: EM | Admit: 2014-04-24 | Discharge: 2014-04-27 | DRG: 392 | Disposition: A | Discharge status: Home / Self Care | Payer: Medicare Other | Attending: Internal Medicine | Admitting: Internal Medicine

## 2014-04-24 ENCOUNTER — Emergency Department (HOSPITAL_COMMUNITY): Payer: Medicare Other

## 2014-04-24 DIAGNOSIS — R1110 Vomiting, unspecified: Secondary | ICD-10-CM | POA: Diagnosis not present

## 2014-04-24 DIAGNOSIS — K625 Hemorrhage of anus and rectum: Secondary | ICD-10-CM | POA: Diagnosis not present

## 2014-04-24 DIAGNOSIS — K635 Polyp of colon: Secondary | ICD-10-CM | POA: Diagnosis present

## 2014-04-24 DIAGNOSIS — F10930 Alcohol use, unspecified with withdrawal, uncomplicated: Secondary | ICD-10-CM

## 2014-04-24 DIAGNOSIS — F17210 Nicotine dependence, cigarettes, uncomplicated: Secondary | ICD-10-CM | POA: Diagnosis present

## 2014-04-24 DIAGNOSIS — Z808 Family history of malignant neoplasm of other organs or systems: Secondary | ICD-10-CM

## 2014-04-24 DIAGNOSIS — Z720 Tobacco use: Secondary | ICD-10-CM | POA: Diagnosis present

## 2014-04-24 DIAGNOSIS — F17200 Nicotine dependence, unspecified, uncomplicated: Secondary | ICD-10-CM | POA: Diagnosis present

## 2014-04-24 DIAGNOSIS — D62 Acute posthemorrhagic anemia: Secondary | ICD-10-CM | POA: Diagnosis present

## 2014-04-24 DIAGNOSIS — F3160 Bipolar disorder, current episode mixed, unspecified: Secondary | ICD-10-CM

## 2014-04-24 DIAGNOSIS — K5730 Diverticulosis of large intestine without perforation or abscess without bleeding: Secondary | ICD-10-CM | POA: Diagnosis present

## 2014-04-24 DIAGNOSIS — R109 Unspecified abdominal pain: Secondary | ICD-10-CM

## 2014-04-24 DIAGNOSIS — E871 Hypo-osmolality and hyponatremia: Secondary | ICD-10-CM | POA: Diagnosis present

## 2014-04-24 DIAGNOSIS — F319 Bipolar disorder, unspecified: Secondary | ICD-10-CM | POA: Diagnosis present

## 2014-04-24 DIAGNOSIS — R112 Nausea with vomiting, unspecified: Secondary | ICD-10-CM | POA: Diagnosis present

## 2014-04-24 DIAGNOSIS — I10 Essential (primary) hypertension: Secondary | ICD-10-CM | POA: Diagnosis present

## 2014-04-24 DIAGNOSIS — K920 Hematemesis: Secondary | ICD-10-CM | POA: Diagnosis present

## 2014-04-24 DIAGNOSIS — D638 Anemia in other chronic diseases classified elsewhere: Secondary | ICD-10-CM | POA: Diagnosis present

## 2014-04-24 DIAGNOSIS — F259 Schizoaffective disorder, unspecified: Secondary | ICD-10-CM | POA: Diagnosis present

## 2014-04-24 DIAGNOSIS — Z803 Family history of malignant neoplasm of breast: Secondary | ICD-10-CM

## 2014-04-24 DIAGNOSIS — D120 Benign neoplasm of cecum: Secondary | ICD-10-CM | POA: Diagnosis not present

## 2014-04-24 DIAGNOSIS — Z801 Family history of malignant neoplasm of trachea, bronchus and lung: Secondary | ICD-10-CM | POA: Diagnosis not present

## 2014-04-24 DIAGNOSIS — R110 Nausea: Secondary | ICD-10-CM | POA: Diagnosis not present

## 2014-04-24 DIAGNOSIS — Z8659 Personal history of other mental and behavioral disorders: Secondary | ICD-10-CM

## 2014-04-24 DIAGNOSIS — R911 Solitary pulmonary nodule: Secondary | ICD-10-CM | POA: Diagnosis present

## 2014-04-24 DIAGNOSIS — D573 Sickle-cell trait: Secondary | ICD-10-CM | POA: Diagnosis present

## 2014-04-24 DIAGNOSIS — F1010 Alcohol abuse, uncomplicated: Secondary | ICD-10-CM

## 2014-04-24 DIAGNOSIS — F1020 Alcohol dependence, uncomplicated: Secondary | ICD-10-CM | POA: Diagnosis present

## 2014-04-24 DIAGNOSIS — K648 Other hemorrhoids: Secondary | ICD-10-CM | POA: Diagnosis present

## 2014-04-24 DIAGNOSIS — IMO0001 Reserved for inherently not codable concepts without codable children: Secondary | ICD-10-CM | POA: Diagnosis present

## 2014-04-24 DIAGNOSIS — K3184 Gastroparesis: Secondary | ICD-10-CM | POA: Diagnosis present

## 2014-04-24 DIAGNOSIS — R1030 Lower abdominal pain, unspecified: Secondary | ICD-10-CM | POA: Diagnosis not present

## 2014-04-24 DIAGNOSIS — K210 Gastro-esophageal reflux disease with esophagitis: Secondary | ICD-10-CM | POA: Diagnosis present

## 2014-04-24 DIAGNOSIS — K449 Diaphragmatic hernia without obstruction or gangrene: Secondary | ICD-10-CM

## 2014-04-24 DIAGNOSIS — Z800 Family history of malignant neoplasm of digestive organs: Secondary | ICD-10-CM | POA: Diagnosis not present

## 2014-04-24 DIAGNOSIS — Z818 Family history of other mental and behavioral disorders: Secondary | ICD-10-CM

## 2014-04-24 DIAGNOSIS — K649 Unspecified hemorrhoids: Secondary | ICD-10-CM | POA: Diagnosis present

## 2014-04-24 DIAGNOSIS — M329 Systemic lupus erythematosus, unspecified: Secondary | ICD-10-CM | POA: Diagnosis present

## 2014-04-24 DIAGNOSIS — K5790 Diverticulosis of intestine, part unspecified, without perforation or abscess without bleeding: Secondary | ICD-10-CM | POA: Diagnosis present

## 2014-04-24 DIAGNOSIS — F10230 Alcohol dependence with withdrawal, uncomplicated: Secondary | ICD-10-CM

## 2014-04-24 DIAGNOSIS — K2970 Gastritis, unspecified, without bleeding: Secondary | ICD-10-CM | POA: Diagnosis present

## 2014-04-24 DIAGNOSIS — R0989 Other specified symptoms and signs involving the circulatory and respiratory systems: Secondary | ICD-10-CM

## 2014-04-24 DIAGNOSIS — F609 Personality disorder, unspecified: Secondary | ICD-10-CM | POA: Diagnosis present

## 2014-04-24 DIAGNOSIS — Z9884 Bariatric surgery status: Secondary | ICD-10-CM

## 2014-04-24 DIAGNOSIS — K222 Esophageal obstruction: Secondary | ICD-10-CM

## 2014-04-24 DIAGNOSIS — E039 Hypothyroidism, unspecified: Secondary | ICD-10-CM | POA: Diagnosis present

## 2014-04-24 DIAGNOSIS — R197 Diarrhea, unspecified: Secondary | ICD-10-CM | POA: Diagnosis not present

## 2014-04-24 DIAGNOSIS — E876 Hypokalemia: Secondary | ICD-10-CM | POA: Diagnosis present

## 2014-04-24 DIAGNOSIS — Z90710 Acquired absence of both cervix and uterus: Secondary | ICD-10-CM | POA: Diagnosis not present

## 2014-04-24 DIAGNOSIS — J9811 Atelectasis: Secondary | ICD-10-CM | POA: Diagnosis not present

## 2014-04-24 HISTORY — DX: Diverticulosis of intestine, part unspecified, without perforation or abscess without bleeding: K57.90

## 2014-04-24 HISTORY — DX: Polyp of colon: K63.5

## 2014-04-24 HISTORY — DX: Alcohol abuse, uncomplicated: F10.10

## 2014-04-24 HISTORY — DX: Diaphragmatic hernia without obstruction or gangrene: K44.9

## 2014-04-24 HISTORY — DX: Nausea with vomiting, unspecified: R11.2

## 2014-04-24 HISTORY — DX: Gastritis, unspecified, without bleeding: K29.70

## 2014-04-24 HISTORY — DX: Intestinal bypass and anastomosis status: Z98.0

## 2014-04-24 HISTORY — DX: Solitary pulmonary nodule: R91.1

## 2014-04-24 HISTORY — DX: Esophageal obstruction: K22.2

## 2014-04-24 HISTORY — DX: Unspecified hemorrhoids: K64.9

## 2014-04-24 LAB — COMPREHENSIVE METABOLIC PANEL
ALT: 55 U/L — ABNORMAL HIGH (ref 0–35)
ANION GAP: 16 — AB (ref 5–15)
AST: 82 U/L — ABNORMAL HIGH (ref 0–37)
Albumin: 4.2 g/dL (ref 3.5–5.2)
Alkaline Phosphatase: 84 U/L (ref 39–117)
BILIRUBIN TOTAL: 1.1 mg/dL (ref 0.3–1.2)
BUN: 5 mg/dL — ABNORMAL LOW (ref 6–23)
CO2: 19 mmol/L (ref 19–32)
CREATININE: 0.56 mg/dL (ref 0.50–1.10)
Calcium: 8.9 mg/dL (ref 8.4–10.5)
Chloride: 99 mmol/L (ref 96–112)
GFR calc Af Amer: >90 mL/min (ref >90)
Glucose, Bld: 70 mg/dL (ref 70–99)
Potassium: 3.7 mmol/L (ref 3.5–5.1)
SODIUM: 134 mmol/L — AB (ref 135–145)
Total Protein: 7.9 g/dL (ref 6.0–8.3)

## 2014-04-24 LAB — CBC WITH DIFFERENTIAL/PLATELET
BASOS ABS: 0 10*3/uL (ref 0.0–0.1)
Basophils Relative: 0 % (ref 0–1)
EOS ABS: 0.1 10*3/uL (ref 0.0–0.7)
Eosinophils Relative: 1 % (ref 0–5)
HCT: 35.9 % — ABNORMAL LOW (ref 36.0–46.0)
Hemoglobin: 12.6 g/dL (ref 12.0–15.0)
Lymphocytes Relative: 24 % (ref 12–46)
Lymphs Abs: 1.7 10*3/uL (ref 0.7–4.0)
MCH: 29.9 pg (ref 26.0–34.0)
MCHC: 35.1 g/dL (ref 30.0–36.0)
MCV: 85.3 fL (ref 78.0–100.0)
MONO ABS: 0.5 10*3/uL (ref 0.1–1.0)
MONOS PCT: 8 % (ref 3–12)
NEUTROS ABS: 4.7 10*3/uL (ref 1.7–7.7)
NEUTROS PCT: 67 % (ref 43–77)
Platelets: 292 10*3/uL (ref 150–400)
RBC: 4.21 MIL/uL (ref 3.87–5.11)
RDW: 16.8 % — AB (ref 11.5–15.5)
WBC: 7 10*3/uL (ref 4.0–10.5)

## 2014-04-24 LAB — URINALYSIS, ROUTINE W REFLEX MICROSCOPIC
GLUCOSE, UA: NEGATIVE mg/dL
Hgb urine dipstick: NEGATIVE
KETONES UR: 40 mg/dL — AB
Leukocytes, UA: NEGATIVE
NITRITE: NEGATIVE
Urobilinogen, UA: 1 mg/dL (ref 0.0–1.0)
pH: 5.5 (ref 5.0–8.0)

## 2014-04-24 LAB — GI PATHOGEN PANEL BY PCR, STOOL
C difficile toxin A/B: NOT DETECTED
CAMPYLOBACTER BY PCR: NOT DETECTED
Cryptosporidium by PCR: NOT DETECTED
E coli (ETEC) LT/ST: NOT DETECTED
E coli (STEC): NOT DETECTED
E coli 0157 by PCR: NOT DETECTED
G lamblia by PCR: NOT DETECTED
NOROVIRUS G1/G2: NOT DETECTED
ROTAVIRUS A BY PCR: NOT DETECTED
Salmonella by PCR: NOT DETECTED
Shigella by PCR: NOT DETECTED

## 2014-04-24 LAB — PROTIME-INR
INR: 0.99 (ref 0.00–1.49)
Prothrombin Time: 13.2 seconds (ref 11.6–15.2)

## 2014-04-24 LAB — URINE CULTURE

## 2014-04-24 LAB — ETHANOL: Alcohol, Ethyl (B): 51 mg/dL — ABNORMAL HIGH (ref 0–9)

## 2014-04-24 LAB — URINE MICROSCOPIC-ADD ON

## 2014-04-24 MED ORDER — ONDANSETRON HCL 4 MG PO TABS: 4.0000 mg | ORAL_TABLET | Freq: Four times a day (QID) | ORAL | Status: DC | PRN

## 2014-04-24 MED ORDER — FENTANYL CITRATE 0.05 MG/ML IJ SOLN
50.0000 ug | INTRAMUSCULAR | Status: AC | PRN
  Administered 2014-04-24 (×2): 50 ug via INTRAVENOUS
  Filled 2014-04-24 (×2): qty 2

## 2014-04-24 MED ORDER — LISINOPRIL 10 MG PO TABS
10.0000 mg | ORAL_TABLET | Freq: Every day | ORAL | Status: DC
  Administered 2014-04-25 – 2014-04-27 (×2): 10 mg via ORAL
  Filled 2014-04-24 (×4): qty 1

## 2014-04-24 MED ORDER — SODIUM CHLORIDE 0.9 % IV BOLUS (SEPSIS)
500.0000 mL | Freq: Once | INTRAVENOUS | Status: AC
  Administered 2014-04-24: 500 mL via INTRAVENOUS

## 2014-04-24 MED ORDER — LORAZEPAM 2 MG/ML IJ SOLN
1.0000 mg | Freq: Once | INTRAMUSCULAR | Status: AC
  Administered 2014-04-24: 1 mg via INTRAVENOUS
  Filled 2014-04-24: qty 1

## 2014-04-24 MED ORDER — ARIPIPRAZOLE 10 MG PO TABS
5.0000 mg | ORAL_TABLET | Freq: Two times a day (BID) | ORAL | Status: DC
  Administered 2014-04-24 – 2014-04-27 (×5): 5 mg via ORAL
  Filled 2014-04-24 (×8): qty 1

## 2014-04-24 MED ORDER — ESCITALOPRAM OXALATE 10 MG PO TABS
20.0000 mg | ORAL_TABLET | Freq: Every day | ORAL | Status: DC
  Administered 2014-04-25 – 2014-04-27 (×2): 20 mg via ORAL
  Filled 2014-04-24 (×2): qty 2
  Filled 2014-04-24: qty 1
  Filled 2014-04-24: qty 2

## 2014-04-24 MED ORDER — ONDANSETRON HCL 4 MG/2ML IJ SOLN
4.0000 mg | INTRAMUSCULAR | Status: AC | PRN
Start: 2014-04-24 — End: 2014-04-24
  Administered 2014-04-24 (×2): 4 mg via INTRAVENOUS
  Filled 2014-04-24 (×2): qty 2

## 2014-04-24 MED ORDER — FLUTICASONE PROPIONATE 50 MCG/ACT NA SUSP
1.0000 | Freq: Every day | NASAL | Status: DC
  Administered 2014-04-25 – 2014-04-27 (×3): 1 via NASAL
  Filled 2014-04-24: qty 16

## 2014-04-24 MED ORDER — ONDANSETRON HCL 4 MG/2ML IJ SOLN: 4.0000 mg | Freq: Three times a day (TID) | INTRAMUSCULAR | Status: DC | PRN

## 2014-04-24 MED ORDER — HYDROMORPHONE HCL 1 MG/ML IJ SOLN
0.5000 mg | INTRAMUSCULAR | Status: AC | PRN
Start: 2014-04-24 — End: 2014-04-25
  Administered 2014-04-24 – 2014-04-25 (×3): 0.5 mg via INTRAVENOUS
  Filled 2014-04-24 (×3): qty 1

## 2014-04-24 MED ORDER — PANTOPRAZOLE SODIUM 40 MG IV SOLR
40.0000 mg | Freq: Two times a day (BID) | INTRAVENOUS | Status: DC
  Administered 2014-04-24 – 2014-04-26 (×4): 40 mg via INTRAVENOUS
  Filled 2014-04-24 (×4): qty 40

## 2014-04-24 MED ORDER — SODIUM CHLORIDE 0.9 % IV SOLN: INTRAVENOUS | Status: DC

## 2014-04-24 MED ORDER — ONDANSETRON HCL 4 MG/2ML IJ SOLN
4.0000 mg | Freq: Four times a day (QID) | INTRAMUSCULAR | Status: DC | PRN
  Administered 2014-04-24 – 2014-04-25 (×2): 4 mg via INTRAVENOUS
  Filled 2014-04-24 (×2): qty 2

## 2014-04-24 MED ORDER — SUCRALFATE 1 G PO TABS
1.0000 g | ORAL_TABLET | Freq: Four times a day (QID) | ORAL | Status: DC
  Administered 2014-04-24 – 2014-04-25 (×5): 1 g via ORAL
  Filled 2014-04-24 (×7): qty 1

## 2014-04-24 MED ORDER — MUPIROCIN 2 % EX OINT
TOPICAL_OINTMENT | Freq: Two times a day (BID) | CUTANEOUS | Status: DC
  Administered 2014-04-24: via NASAL
  Filled 2014-04-24: qty 22

## 2014-04-24 MED ORDER — ARIPIPRAZOLE 10 MG PO TABS
ORAL_TABLET | ORAL | Status: AC
  Filled 2014-04-24: qty 1

## 2014-04-24 MED ORDER — SODIUM CHLORIDE 0.9 % IJ SOLN
3.0000 mL | Freq: Two times a day (BID) | INTRAMUSCULAR | Status: DC
  Administered 2014-04-24 – 2014-04-27 (×3): 3 mL via INTRAVENOUS

## 2014-04-24 MED ORDER — HYDROMORPHONE HCL 1 MG/ML IJ SOLN: 1.0000 mg | INTRAMUSCULAR | Status: DC | PRN

## 2014-04-24 MED ORDER — SODIUM CHLORIDE 0.9 % IV SOLN
INTRAVENOUS | Status: DC
  Administered 2014-04-24: 18:00:00 via INTRAVENOUS

## 2014-04-24 MED ORDER — HYDROMORPHONE HCL 1 MG/ML IJ SOLN
1.0000 mg | Freq: Once | INTRAMUSCULAR | Status: AC
  Administered 2014-04-24: 1 mg via INTRAVENOUS
  Filled 2014-04-24: qty 1

## 2014-04-24 MED ORDER — METOCLOPRAMIDE HCL 5 MG/ML IJ SOLN
10.0000 mg | Freq: Four times a day (QID) | INTRAMUSCULAR | Status: DC
  Administered 2014-04-24 – 2014-04-25 (×2): 10 mg via INTRAVENOUS
  Filled 2014-04-24 (×2): qty 2

## 2014-04-24 MED ORDER — LISINOPRIL 10 MG PO TABS: 10.0000 mg | ORAL_TABLET | Freq: Every day | ORAL | Status: DC

## 2014-04-24 MED ORDER — SODIUM CHLORIDE 0.9 % IV SOLN
INTRAVENOUS | Status: DC
  Administered 2014-04-24: via INTRAVENOUS

## 2014-04-24 NOTE — ED Notes (Signed)
Having vomiting for last 4 days.  Pt says she vomit about 10 times each day.  Says she been seeing spots of blood in emesis.

## 2014-04-24 NOTE — ED Notes (Signed)
Pt just reveal she drinks about 18 beers per day.  Says she feels the "shakes coming".  Says she would like to quit.  Will inform MD.

## 2014-04-24 NOTE — ED Provider Notes (Signed)
CSN: [id number]     Arrival date & time 04/24/14  1608 History   First MD Initiated Contact with Patient 04/24/14 1628     Chief Complaint  Patient presents with  . Emesis      HPI Pt was seen at 1635. Per pt, c/o gradual onset and persistence of multiple intermittent episodes of acute flair of her chronic recurrent N/V/D that began 4 days ago. Describes the emesis has having "blood flecks in it." Has been associated with acute flair of her chronic generalized abd "pain." States she has been unable to consume her usual amount of daily etoh due to N/V and is "starting to feel shaky." Denies CP/SOB, no back pain, no fevers, no black or blood in stools.     Past Medical History  Diagnosis Date  . Lupus   . 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 Hypertension   . Abscess     soft tissue  . Suicide attempt   . Blood transfusion without reported diagnosis   . Chronic abdominal pain   . Chronic wound infection of abdomen   . Adrenal mass   . Anxiety   . Depression   . GERD (gastroesophageal reflux disease)   . Sickle cell trait   . Gastroparesis Nov 2015  . Nausea and vomiting     chronic, recurrent  . Alcohol abuse    Past Surgical History  Procedure Laterality Date  . Abdominal surgery    . Abdominal hysterectomy  2013    [place]  . Debridement of abdominal wall abscess N/A 02/08/2013    Procedure: DEBRIDEMENT OF ABDOMINAL WALL ABSCESS;  Surgeon: Jamesetta So, MD;  Location: [facility];  Service: General;  Laterality: N/A;  . Billroth ii procedure       [place], first 2000, 2005/2006.  . Colonoscopy      in [place]  . Cholecystectomy    . Colonoscopy with propofol N/A 05/20/2013    Dr.Rourk- inadequate prep, normal appearing rectum, grossly normal colon aside from pancolonic diverticula, normal terminal ileum bx= unremarkable colonic mucosa. Due for early interval 2016.   Marland Kitchen Esophagogastroduodenoscopy (egd) with propofol N/A 05/20/2013    Dr.Rourk- s/p prior gastric  surgery with normal esophagus, residual gastric mucosa and patent efferent limb  . Esophageal biopsy  05/20/2013    Procedure: BIOPSIES OF ASCENDING AND SIGMOID COLON;  Surgeon: Daneil Dolin, MD;  Location: [facility];  Service: Endoscopy;;  . Wound exploration Right 06/24/2013    Procedure: exploration of traumatic wound right wrist;  Surgeon: Tennis Must, MD;  Location: [facility];  Service: Orthopedics;  Laterality: Right;  . Tendon repar Right     wrist  . Adrenal turmor removal    . Esophagogastroduodenoscopy (egd) with propofol N/A 02/03/2014    Dr. Gala Romney:  s/p hemigastrectomy with retained gastric contents. Residual gastric mucosa and efferent limb appeared normal otherwise. Query gastroparesis.    Family History  Problem Relation Age of Onset  . Brain cancer Son   . Schizophrenia Son   . Cancer Son     brain  . Breast cancer Maternal Aunt   . Bipolar disorder Maternal Aunt   . Drug abuse Maternal Aunt   . Colon cancer Maternal Grandmother     late 73s, early 21s  . Lung cancer Father   . Cancer Father     mets  . Liver disease Neg Hx   . Drug abuse Mother   . Drug abuse Sister   .  Drug abuse Brother   . Bipolar disorder Paternal Grandfather   . Bipolar disorder Cousin    History  Substance Use Topics  . Smoking status: Current Every Day Smoker -- 0.25 packs/day for 35 years    Types: Cigarettes  . Smokeless tobacco: Never Used  . Alcohol Use: 7.2 oz/week    12 Cans of beer, 0 Not specified per week     Comment: history of ETOH abuse in the past, last 3 days had 3 beers.     Review of Systems ROS: Statement: All systems negative except as marked or noted in the HPI; Constitutional: Negative for fever and chills. ; ; Eyes: Negative for eye pain, redness and discharge. ; ; ENMT: Negative for ear pain, hoarseness, nasal congestion, sinus pressure and sore throat. ; ; Cardiovascular: Negative for chest pain, palpitations, diaphoresis, dyspnea and peripheral edema. ; ;  Respiratory: Negative for cough, wheezing and stridor. ; ; Gastrointestinal: +N/V/D, abd pain, "blood flecks" in emesis. Negative for blood in stool, jaundice and rectal bleeding. . ; ; Genitourinary: Negative for dysuria, flank pain and hematuria. ; ; Musculoskeletal: Negative for back pain and neck pain. Negative for swelling and trauma.; ; Skin: Negative for pruritus, rash, abrasions, blisters, bruising and skin lesion.; ; Neuro: Negative for headache, lightheadedness and neck stiffness. Negative for weakness, altered level of consciousness , altered mental status, extremity weakness, paresthesias, involuntary movement, seizure and syncope.       Allergies  Codeine and Morphine and related  Home Medications   Prior to Admission medications   Medication Sig Start Date End Date Taking? Authorizing Provider  ARIPiprazole (ABILIFY) 5 MG tablet Take 1 tablet (5 mg total) by mouth 2 (two) times daily. 07/16/13   Levonne Spiller, MD  escitalopram (LEXAPRO) 20 MG tablet Take 1 tablet (20 mg total) by mouth daily. 07/16/13 07/16/14  Levonne Spiller, MD  fluticasone (FLONASE) 50 MCG/ACT nasal spray Place 1 spray into both nostrils daily.    Historical Provider, MD  HYDROcodone-acetaminophen (NORCO/VICODIN) 5-325 MG per tablet Take 1 tablet by mouth every 6 (six) hours as needed for severe pain. 04/06/14   Radene Gunning, NP  lisinopril (PRINIVIL,ZESTRIL) 10 MG tablet Take 10 mg by mouth daily.    Historical Provider, MD  metoCLOPramide (REGLAN) 10 MG tablet Take 1 tablet (10 mg total) by mouth 4 (four) times daily -  before meals and at bedtime. 04/06/14   Radene Gunning, NP  mupirocin ointment (BACTROBAN) 2 % Place into the nose 2 (two) times daily. 12/13/13   Maryann Mikhail, DO  ondansetron (ZOFRAN) 4 MG tablet Take 1 tablet (4 mg total) by mouth 3 (three) times daily. With meals Patient taking differently: Take 4 mg by mouth 3 (three) times daily as needed for nausea or vomiting. With meals 01/21/14   Orvil Feil,  NP  pantoprazole (PROTONIX) 40 MG tablet Take 1 tablet (40 mg total) by mouth 2 (two) times daily before a meal. 01/21/14   Orvil Feil, NP  promethazine (PHENERGAN) 25 MG tablet Take 1 tablet (25 mg total) by mouth every 6 (six) hours as needed. Patient taking differently: Take 25 mg by mouth every 6 (six) hours as needed for nausea or vomiting.  01/14/14   [name], MD  sucralfate (CARAFATE) 1 G tablet Take 1 g by mouth 4 (four) times daily.    Historical Provider, MD   BP 113/87 mmHg  Pulse 111  Temp(Src) 98.5 F (36.9 C) (Oral)  Resp 18  Ht 5\' 3"  (1.6 m)  Wt 131 lb (59.421 kg)  BMI 23.21 kg/m2  SpO2 97% Physical Exam  1640: Physical examination:  Nursing notes reviewed; Vital signs and O2 SAT reviewed;  Constitutional: Well developed, Well nourished, Well hydrated, In no acute distress; Head:  Normocephalic, atraumatic; Eyes: EOMI, PERRL, No scleral icterus; ENMT: Mouth and pharynx normal, Mucous membranes moist; Neck: Supple, Full range of motion, No lymphadenopathy; Cardiovascular: Tachycardic rate and rhythm, No gallop; Respiratory: Breath sounds clear & equal bilaterally, No rales, rhonchi, wheezes.  Speaking full sentences with ease, Normal respiratory effort/excursion; Chest: Nontender, Movement normal; Abdomen: Soft, +diffuse tenderness to palp. Nondistended, Normal bowel sounds; Genitourinary: No CVA tenderness; Extremities: Pulses normal, No tenderness, No edema, No calf edema or asymmetry.; Neuro: AA&Ox3, Major CN grossly intact.  Speech clear. No gross focal motor or sensory deficits in extremities. +mildly tremorous.; Skin: Color normal, Warm, Dry.   ED Course  Procedures      EKG Interpretation None      MDM  MDM Reviewed: nursing note, vitals and previous chart Reviewed previous: labs Interpretation: labs and x-ray      Results for orders placed or performed during the hospital encounter of 04/24/14  Comprehensive metabolic panel  Result Value Ref Range    Sodium 134 (L) 135 - 145 mmol/L   Potassium 3.7 3.5 - 5.1 mmol/L   Chloride 99 96 - 112 mmol/L   CO2 19 19 - 32 mmol/L   Glucose, Bld 70 70 - 99 mg/dL   BUN 5 (L) 6 - 23 mg/dL   Creatinine, Ser 0.56 0.50 - 1.10 mg/dL   Calcium 8.9 8.4 - 10.5 mg/dL   Total Protein 7.9 6.0 - 8.3 g/dL   Albumin 4.2 3.5 - 5.2 g/dL   AST 82 (H) 0 - 37 U/L   ALT 55 (H) 0 - 35 U/L   Alkaline Phosphatase 84 39 - 117 U/L   Total Bilirubin 1.1 0.3 - 1.2 mg/dL   GFR calc non Af Amer >90 >90 mL/min   GFR calc Af Amer >90 >90 mL/min   Anion gap 16 (H) 5 - 15  Ethanol  Result Value Ref Range   Alcohol, Ethyl (B) 51 (H) 0 - 9 mg/dL  CBC with Differential  Result Value Ref Range   WBC 7.0 4.0 - 10.5 K/uL   RBC 4.21 3.87 - 5.11 MIL/uL   Hemoglobin 12.6 12.0 - 15.0 g/dL   HCT 35.9 (L) 36.0 - 46.0 %   MCV 85.3 78.0 - 100.0 fL   MCH 29.9 26.0 - 34.0 pg   MCHC 35.1 30.0 - 36.0 g/dL   RDW 16.8 (H) 11.5 - 15.5 %   Platelets 292 150 - 400 K/uL   Neutrophils Relative % 67 43 - 77 %   Neutro Abs 4.7 1.7 - 7.7 K/uL   Lymphocytes Relative 24 12 - 46 %   Lymphs Abs 1.7 0.7 - 4.0 K/uL   Monocytes Relative 8 3 - 12 %   Monocytes Absolute 0.5 0.1 - 1.0 K/uL   Eosinophils Relative 1 0 - 5 %   Eosinophils Absolute 0.1 0.0 - 0.7 K/uL   Basophils Relative 0 0 - 1 %   Basophils Absolute 0.0 0.0 - 0.1 K/uL  Protime-INR  Result Value Ref Range   Prothrombin Time 13.2 11.6 - 15.2 seconds   INR 0.99 0.00 - 1.49  Urinalysis, Routine w reflex microscopic  Result Value Ref Range   Color, Urine YELLOW YELLOW  APPearance CLEAR CLEAR   Specific Gravity, Urine >1.030 (H) 1.005 - 1.030   pH 5.5 5.0 - 8.0   Glucose, UA NEGATIVE NEGATIVE mg/dL   Hgb urine dipstick NEGATIVE NEGATIVE   Bilirubin Urine MODERATE (A) NEGATIVE   Ketones, ur 40 (A) NEGATIVE mg/dL   Protein, ur TRACE (A) NEGATIVE mg/dL   Urobilinogen, UA 1.0 0.0 - 1.0 mg/dL   Nitrite NEGATIVE NEGATIVE   Leukocytes, UA NEGATIVE NEGATIVE  Urine microscopic-add on   Result Value Ref Range   Squamous Epithelial / LPF MANY (A) RARE   RBC / HPF 0-2 <3 RBC/hpf   Dg Abd Acute W/chest 04/24/2014   CLINICAL DATA:  Mid abdominal pain, vomiting and diarrhea over the past 5 days. Multiple prior abdominal surgeries. Current history of hypertension, lupus.  EXAM: ACUTE ABDOMEN SERIES (ABDOMEN 2 VIEW & CHEST 1 VIEW)  COMPARISON:  Two-view chest x-ray 03/14/2014. Acute abdomen series 02/02/2014. CT chest abdomen and pelvis 03/02/2014. Multiple prior CT abdomen and pelvis examinations prior to that.  FINDINGS: Numerous surgical clips in the right upper quadrant abdomen and anastomotic suture material in the upper and mid abdomen. Bowel gas pattern normal without evidence of obstruction or significant ileus. Expected stool burden in the colon. No evidence of free intraperitoneal air or significant air-fluid levels on the erect image. No visible opaque urinary tract calculi. Phleboliths in the pelvis. Injection granulomata in the right buttock. Regional skeleton unremarkable.  Cardiomediastinal silhouette unremarkable, unchanged. Lungs clear. Bronchovascular markings normal. Pulmonary vascularity normal. No visible pleural effusions. No pneumothorax.  IMPRESSION: 1. No acute abdominal abnormality. 2.  No acute cardiopulmonary disease.   Electronically Signed   By: Evangeline Dakin M.D.   On: 04/24/2014 18:03     1930:  Pt continues to c/o abd pain and N/V despite multiple doses of IV meds. +emesis in ED has flecks of red blood, no gross hematemesis. No stooling while in the ED.  Pt continues to c/o "feeling shaky" and HR elevated; will dose ativan for etoh withdrawal. Complicated GI hx; though multiple GI notes request to hold CT scans (pt has had numerous scans negative for acute process); will observation admit. Dx and testing d/w pt.  Questions answered.  Verb understanding, agreeable to admit.   T/C to [facility] Dr. Anastasio Champion, case discussed, including:  HPI, pertinent PM/SHx, VS/PE, dx  testing, ED course and treatment:  Agreeable to admit, requests to write temporary orders, obtain observation medical bed to team [facility].    Francine Graven, DO 04/27/14 2013

## 2014-04-24 NOTE — H&P (Signed)
[facility] History and Physical  Angel Price KGY:[id number] DOB: 29-Apr-1965 DOA: 04/24/2014  Referring physician: ER PCP: Doran Heater, MD   Chief Complaint: Nausea, vomiting, abdominal pain.  HPI: Angel Price is a 49 y.o. female  This is a 49 year old lady, who is well-known to the hospital and to the gastroenterologist, who has a history of gastroparesis and alcoholism now presents with intractable nausea and vomiting which she has had for the last 3-4 days. She also describes possibly some coffee-ground emesis and some rectal bleeding. She has been recommended to go to a pain clinic in the outpatient setting. She denies any fever, chest pain, palpitations or limb weakness. She continues to drink alcohol, 3-4 beers every day. She also has a psychiatric history with possible bipolar disorder, personality disorder. Since she has intractable nausea and vomiting, she is now being admitted for further management.   Review of Systems:  Apart from symptoms above, all systems negative.  Past Medical History  Diagnosis Date  . Lupus   . 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 Hypertension   . Abscess     soft tissue  . Suicide attempt   . Blood transfusion without reported diagnosis   . Chronic abdominal pain   . Chronic wound infection of abdomen   . Adrenal mass   . Anxiety   . Depression   . GERD (gastroesophageal reflux disease)   . Sickle cell trait   . Gastroparesis Nov 2015  . Nausea and vomiting     chronic, recurrent  . Alcohol abuse    Past Surgical History  Procedure Laterality Date  . Abdominal surgery    . Abdominal hysterectomy  2013    [place]  . Debridement of abdominal wall abscess N/A 02/08/2013    Procedure: DEBRIDEMENT OF ABDOMINAL WALL ABSCESS;  Surgeon: Jamesetta So, MD;  Location: [facility];  Service: General;  Laterality: N/A;  . Billroth ii procedure       [place], first 2000, 2005/2006.  . Colonoscopy      in [place]    . Cholecystectomy    . Colonoscopy with propofol N/A 05/20/2013    Dr.Rourk- inadequate prep, normal appearing rectum, grossly normal colon aside from pancolonic diverticula, normal terminal ileum bx= unremarkable colonic mucosa. Due for early interval 2016.   Marland Kitchen Esophagogastroduodenoscopy (egd) with propofol N/A 05/20/2013    Dr.Rourk- s/p prior gastric surgery with normal esophagus, residual gastric mucosa and patent efferent limb  . Esophageal biopsy  05/20/2013    Procedure: BIOPSIES OF ASCENDING AND SIGMOID COLON;  Surgeon: Daneil Dolin, MD;  Location: [facility];  Service: Endoscopy;;  . Wound exploration Right 06/24/2013    Procedure: exploration of traumatic wound right wrist;  Surgeon: Tennis Must, MD;  Location: [facility];  Service: Orthopedics;  Laterality: Right;  . Tendon repar Right     wrist  . Adrenal turmor removal    . Esophagogastroduodenoscopy (egd) with propofol N/A 02/03/2014    Dr. Gala Romney:  s/p hemigastrectomy with retained gastric contents. Residual gastric mucosa and efferent limb appeared normal otherwise. Query gastroparesis.    Social History:  reports that she has been smoking Cigarettes.  She has a 8.75 pack-year smoking history. She has never used smokeless tobacco. She reports that she drinks about 7.2 oz of alcohol per week. She reports that she does not use illicit drugs.  Allergies  Allergen Reactions  . Codeine Hives  . Morphine And Related Hives  gastritis    Family History  Problem Relation Age of Onset  . Brain cancer Son   . Schizophrenia Son   . Cancer Son     brain  . Breast cancer Maternal Aunt   . Bipolar disorder Maternal Aunt   . Drug abuse Maternal Aunt   . Colon cancer Maternal Grandmother     late 12s, early 15s  . Lung cancer Father   . Cancer Father     mets  . Liver disease Neg Hx   . Drug abuse Mother   . Drug abuse Sister   . Drug abuse Brother   . Bipolar disorder Paternal Grandfather   . Bipolar disorder Cousin      Prior to  Admission medications   Medication Sig Start Date End Date Taking? Authorizing Provider  ARIPiprazole (ABILIFY) 5 MG tablet Take 1 tablet (5 mg total) by mouth 2 (two) times daily. 07/16/13  Yes Levonne Spiller, MD  escitalopram (LEXAPRO) 20 MG tablet Take 1 tablet (20 mg total) by mouth daily. 07/16/13 07/16/14 Yes Levonne Spiller, MD  fluticasone (FLONASE) 50 MCG/ACT nasal spray Place 1 spray into both nostrils daily.   Yes Historical Provider, MD  lisinopril (PRINIVIL,ZESTRIL) 10 MG tablet Take 10 mg by mouth daily.   Yes Historical Provider, MD  metoCLOPramide (REGLAN) 10 MG tablet Take 1 tablet (10 mg total) by mouth 4 (four) times daily -  before meals and at bedtime. 04/06/14  Yes Lezlie Octave Black, NP  ondansetron (ZOFRAN) 4 MG tablet Take 1 tablet (4 mg total) by mouth 3 (three) times daily. With meals Patient taking differently: Take 4 mg by mouth 3 (three) times daily as needed for nausea or vomiting. With meals 01/21/14  Yes Orvil Feil, NP  pantoprazole (PROTONIX) 40 MG tablet Take 1 tablet (40 mg total) by mouth 2 (two) times daily before a meal. 01/21/14  Yes Orvil Feil, NP  sucralfate (CARAFATE) 1 G tablet Take 1 g by mouth 4 (four) times daily.   Yes Historical Provider, MD  HYDROcodone-acetaminophen (NORCO/VICODIN) 5-325 MG per tablet Take 1 tablet by mouth every 6 (six) hours as needed for severe pain. Patient not taking: Reported on 04/24/2014 04/06/14   Radene Gunning, NP  mupirocin ointment (BACTROBAN) 2 % Place into the nose 2 (two) times daily. 12/13/13   Maryann Mikhail, DO  promethazine (PHENERGAN) 25 MG tablet Take 1 tablet (25 mg total) by mouth every 6 (six) hours as needed. Patient not taking: Reported on 04/24/2014 01/14/14   [name], MD   Physical Exam: Filed Vitals:   04/24/14 1619 04/24/14 1630 04/24/14 1700  BP: 122/87 120/92 113/87  Pulse: 103 116 111  Temp: 98.5 F (36.9 C)    TempSrc: Oral    Resp: 18  18  Height: 5\' 3"  (1.6 m)    Weight: 59.421 kg (131 lb)    SpO2: 97%  95% 97%    Wt Readings from Last 3 Encounters:  04/24/14 59.421 kg (131 lb)  04/06/14 60.011 kg (132 lb 4.8 oz)  04/01/14 55.611 kg (122 lb 9.6 oz)    General:  Appears calm and comfortable. She does not look particularly clinically dehydrated. Eyes: PERRL, normal lids, irises & conjunctiva ENT: grossly normal hearing, lips & tongue Neck: no LAD, masses or thyromegaly Cardiovascular: RRR, no m/r/g. No LE edema. Telemetry: SR, no arrhythmias  Respiratory: CTA bilaterally, no w/r/r. Normal respiratory effort. Abdomen: soft, subjective tenderness in the abdomen in a generalized fashion. Bowel  sounds are heard, somewhat scanty. There is no clinical evidence of an acute abdomen. Skin: no rash or induration seen on limited exam Musculoskeletal: grossly normal tone BUE/BLE Psychiatric: grossly normal mood and affect, speech fluent and appropriate Neurologic: grossly non-focal.          Labs on Admission:  Basic Metabolic Panel:  Recent Labs Lab 04/24/14 1650  NA 134*  K 3.7  CL 99  CO2 19  GLUCOSE 70  BUN 5*  CREATININE 0.56  CALCIUM 8.9   Liver Function Tests:  Recent Labs Lab 04/24/14 1650  AST 82*  ALT 55*  ALKPHOS 84  BILITOT 1.1  PROT 7.9  ALBUMIN 4.2   No results for input(s): LIPASE, AMYLASE in the last 168 hours. No results for input(s): AMMONIA in the last 168 hours. CBC:  Recent Labs Lab 04/24/14 1650  WBC 7.0  NEUTROABS 4.7  HGB 12.6  HCT 35.9*  MCV 85.3  PLT 292   Cardiac Enzymes: No results for input(s): CKTOTAL, CKMB, CKMBINDEX, TROPONINI in the last 168 hours.  BNP (last 3 results)  Recent Labs  03/14/14 1054  BNP 13.0    ProBNP (last 3 results) No results for input(s): PROBNP in the last 8760 hours.  CBG: No results for input(s): GLUCAP in the last 168 hours.  Radiological Exams on Admission:   Assessment/Plan   1. Intractable nausea and vomiting. This is likely secondary to gastroparesis but also compounded by continued  alcoholism. We will start her on intravenous fluids, intravenous metoclopramide and intravenous Protonix. We will ask gastroenterology to see her again. There is a possibility that she does have upper GI bleed from her history and we will monitor hemoglobin closely. 2. Gastroparesis. Treatment as above for #1. 3. Alcohol abuse, ongoing. 4. Bipolar disorder.  Further recommendations will depend on patient's hospital progress.   Code Status: Full code.  DVT Prophylaxis: SCDs  Family Communication: I discussed the plan with the patient at the bedside.   Disposition Plan: Home when medically stable.  Time spent: 60 minutes.  Doree Albee [facility] Pager [phone].

## 2014-04-25 ENCOUNTER — Encounter (HOSPITAL_COMMUNITY): Payer: Self-pay | Admitting: Gastroenterology

## 2014-04-25 DIAGNOSIS — K3184 Gastroparesis: Principal | ICD-10-CM

## 2014-04-25 DIAGNOSIS — Z8659 Personal history of other mental and behavioral disorders: Secondary | ICD-10-CM

## 2014-04-25 DIAGNOSIS — K625 Hemorrhage of anus and rectum: Secondary | ICD-10-CM

## 2014-04-25 DIAGNOSIS — R911 Solitary pulmonary nodule: Secondary | ICD-10-CM

## 2014-04-25 DIAGNOSIS — E871 Hypo-osmolality and hyponatremia: Secondary | ICD-10-CM | POA: Diagnosis present

## 2014-04-25 DIAGNOSIS — IMO0001 Reserved for inherently not codable concepts without codable children: Secondary | ICD-10-CM

## 2014-04-25 DIAGNOSIS — R1110 Vomiting, unspecified: Secondary | ICD-10-CM

## 2014-04-25 DIAGNOSIS — F1010 Alcohol abuse, uncomplicated: Secondary | ICD-10-CM

## 2014-04-25 DIAGNOSIS — D62 Acute posthemorrhagic anemia: Secondary | ICD-10-CM

## 2014-04-25 DIAGNOSIS — E876 Hypokalemia: Secondary | ICD-10-CM | POA: Diagnosis not present

## 2014-04-25 DIAGNOSIS — K920 Hematemesis: Secondary | ICD-10-CM | POA: Diagnosis present

## 2014-04-25 DIAGNOSIS — R000 Tachycardia, unspecified: Secondary | ICD-10-CM | POA: Insufficient documentation

## 2014-04-25 HISTORY — DX: Solitary pulmonary nodule: R91.1

## 2014-04-25 HISTORY — DX: Reserved for inherently not codable concepts without codable children: IMO0001

## 2014-04-25 LAB — COMPREHENSIVE METABOLIC PANEL
ALT: 37 U/L — ABNORMAL HIGH (ref 0–35)
AST: 48 U/L — AB (ref 0–37)
Albumin: 3.4 g/dL — ABNORMAL LOW (ref 3.5–5.2)
Alkaline Phosphatase: 68 U/L (ref 39–117)
Anion gap: 7 (ref 5–15)
BILIRUBIN TOTAL: 1.4 mg/dL — AB (ref 0.3–1.2)
CALCIUM: 8.2 mg/dL — AB (ref 8.4–10.5)
CHLORIDE: 102 mmol/L (ref 96–112)
CO2: 23 mmol/L (ref 19–32)
CREATININE: 0.62 mg/dL (ref 0.50–1.10)
GFR calc Af Amer: >90 mL/min (ref >90)
GFR calc non Af Amer: >90 mL/min (ref >90)
GLUCOSE: 84 mg/dL (ref 70–99)
Potassium: 3.1 mmol/L — ABNORMAL LOW (ref 3.5–5.1)
Sodium: 132 mmol/L — ABNORMAL LOW (ref 135–145)
Total Protein: 6.3 g/dL (ref 6.0–8.3)

## 2014-04-25 LAB — CBC
HCT: 29.2 % — ABNORMAL LOW (ref 36.0–46.0)
HEMATOCRIT: 29.6 % — AB (ref 36.0–46.0)
Hemoglobin: 10.2 g/dL — ABNORMAL LOW (ref 12.0–15.0)
Hemoglobin: 10.1 g/dL — ABNORMAL LOW (ref 12.0–15.0)
MCH: 29.6 pg (ref 26.0–34.0)
MCH: 29.9 pg (ref 26.0–34.0)
MCHC: 34.5 g/dL (ref 30.0–36.0)
MCHC: 34.6 g/dL (ref 30.0–36.0)
MCV: 85.8 fL (ref 78.0–100.0)
MCV: 86.4 fL (ref 78.0–100.0)
PLATELETS: 225 10*3/uL (ref 150–400)
Platelets: 252 10*3/uL (ref 150–400)
RBC: 3.45 MIL/uL — ABNORMAL LOW (ref 3.87–5.11)
RBC: 3.38 MIL/uL — ABNORMAL LOW (ref 3.87–5.11)
RDW: 16.6 % — AB (ref 11.5–15.5)
RDW: 16.8 % — AB (ref 11.5–15.5)
WBC: 4.9 10*3/uL (ref 4.0–10.5)
WBC: 6 10*3/uL (ref 4.0–10.5)

## 2014-04-25 LAB — MRSA PCR SCREENING: MRSA by PCR: NEGATIVE

## 2014-04-25 MED ORDER — METOCLOPRAMIDE HCL 5 MG/ML IJ SOLN
10.0000 mg | Freq: Three times a day (TID) | INTRAMUSCULAR | Status: DC
  Filled 2014-04-25: qty 2

## 2014-04-25 MED ORDER — POTASSIUM CHLORIDE IN NACL 40-0.9 MEQ/L-% IV SOLN
INTRAVENOUS | Status: DC
  Administered 2014-04-25 – 2014-04-26 (×3): 100 mL/h via INTRAVENOUS

## 2014-04-25 MED ORDER — HYDROMORPHONE HCL 1 MG/ML IJ SOLN
0.5000 mg | INTRAMUSCULAR | Status: DC | PRN
  Administered 2014-04-25 – 2014-04-27 (×11): 0.5 mg via INTRAVENOUS
  Filled 2014-04-25 (×12): qty 1

## 2014-04-25 MED ORDER — METOCLOPRAMIDE HCL 5 MG/ML IJ SOLN
10.0000 mg | Freq: Three times a day (TID) | INTRAMUSCULAR | Status: DC
  Administered 2014-04-25 – 2014-04-26 (×3): 10 mg via INTRAVENOUS
  Filled 2014-04-25 (×3): qty 2

## 2014-04-25 MED ORDER — NICOTINE 14 MG/24HR TD PT24
14.0000 mg | MEDICATED_PATCH | Freq: Every day | TRANSDERMAL | Status: DC
  Administered 2014-04-25 – 2014-04-27 (×3): 14 mg via TRANSDERMAL
  Filled 2014-04-25 (×3): qty 1

## 2014-04-25 MED ORDER — POTASSIUM CHLORIDE 10 MEQ/100ML IV SOLN
10.0000 meq | INTRAVENOUS | Status: AC
  Administered 2014-04-25 (×2): 10 meq via INTRAVENOUS
  Filled 2014-04-25 (×2): qty 100

## 2014-04-25 MED ORDER — PEG 3350-KCL-NABCB-NACL-NASULF 236 G PO SOLR
2000.0000 mL | Freq: Once | ORAL | Status: AC
  Administered 2014-04-26: 2000 mL via ORAL
  Filled 2014-04-25: qty 4000

## 2014-04-25 MED ORDER — PEG 3350-KCL-NA BICARB-NACL 420 G PO SOLR
2000.0000 mL | Freq: Once | ORAL | Status: AC
  Administered 2014-04-25: 2000 mL via ORAL

## 2014-04-25 MED ORDER — LORAZEPAM 0.5 MG PO TABS
0.5000 mg | ORAL_TABLET | Freq: Once | ORAL | Status: AC
  Administered 2014-04-25: 0.5 mg via ORAL
  Filled 2014-04-25: qty 1

## 2014-04-25 MED ORDER — ONDANSETRON HCL 4 MG/2ML IJ SOLN
4.0000 mg | Freq: Three times a day (TID) | INTRAMUSCULAR | Status: DC
  Administered 2014-04-25 – 2014-04-27 (×10): 4 mg via INTRAVENOUS
  Filled 2014-04-25 (×10): qty 2

## 2014-04-25 NOTE — Progress Notes (Signed)
UR chart review completed.  

## 2014-04-25 NOTE — Progress Notes (Signed)
[facility] PROGRESS NOTE  [place] DOST FSF:[id number] DOB: Aug 24, 1965 DOA: 04/24/2014 PCP: Doran Heater, MD  Assessment/Plan: 1. Intractable nausea and vomiting.  Chronic secondary to gastroparesis in setting of continued ETOH abuse. Some improvement this am with IV fluids and bowel rest. Continue protonix and reglan.  Await gastroenterology input. 2. Rectal bleeding: reports 5-6 bloody loose stools for last 4-5 days. Denies abdominal pain. Denies clots. Hg dropped from 12.6 to 10.2. Await GI input. Continue PPI. Serial CBC's.  3. Acute blood loss anemia: mild tachycardia, BP somewhat softer than when she was admitted. Has had "several episodes" of "maroon blood in loose stool". Serial CBC's as noted above. Continue supportive therapy. Monitor closely.  4. Gastroparesis.  Treatment as above for #1. 5. Hypokalemia: related to #1. Will replete and recheck obtain magnesium level as well.  6. Alcohol abuse, ongoing.  7. Bipolar disorder. Stable at baseline 8. Abdominal pain: chronic likely related to above. Monitor pain med as indicated  Code Status: full Family Communication: none present Disposition Plan: home when ready   Consultants:  GI  Procedures:  none  Antibiotics:  none  HPI/Subjective: Awake alert. Reports several episodes of "blood from my rectum". Denies pain/discomfort  Objective: Filed Vitals:   04/25/14 0452  BP: 111/73  Pulse: 91  Temp: 98.4 F (36.9 C)  Resp: 18   No intake or output data in the 24 hours ending 04/25/14 1006 Filed Weights   04/24/14 1619  Weight: 59.421 kg (131 lb)    Exam:   General:  Well nourished appears comfortable  Cardiovascular: tachycardic but regular no m/g/r no LE edema  Respiratory: normal effort BS clear bilaterally   Abdomen: non-distended +BS non-tender to BS no guarding or rebounding  Musculoskeletal: no clubbing or cyanosis   Data Reviewed: Basic Metabolic Panel:  Recent Labs Lab  04/24/14 1650 04/25/14 0629  NA 134* 132*  K 3.7 3.1*  CL 99 102  CO2 19 23  GLUCOSE 70 84  BUN 5* <5*  CREATININE 0.56 0.62  CALCIUM 8.9 8.2*   Liver Function Tests:  Recent Labs Lab 04/24/14 1650 04/25/14 0629  AST 82* 48*  ALT 55* 37*  ALKPHOS 84 68  BILITOT 1.1 1.4*  PROT 7.9 6.3  ALBUMIN 4.2 3.4*   No results for input(s): LIPASE, AMYLASE in the last 168 hours. No results for input(s): AMMONIA in the last 168 hours. CBC:  Recent Labs Lab 04/24/14 1650 04/25/14 0629  WBC 7.0 4.9  NEUTROABS 4.7  --   HGB 12.6 10.2*  HCT 35.9* 29.6*  MCV 85.3 85.8  PLT 292 252   Cardiac Enzymes: No results for input(s): CKTOTAL, CKMB, CKMBINDEX, TROPONINI in the last 168 hours. BNP (last 3 results)  Recent Labs  03/14/14 1054  BNP 13.0    ProBNP (last 3 results) No results for input(s): PROBNP in the last 8760 hours.  CBG: No results for input(s): GLUCAP in the last 168 hours.  No results found for this or any previous visit (from the past 240 hour(s)).   Studies: Dg Abd Acute W/chest  04/24/2014   CLINICAL DATA:  Mid abdominal pain, vomiting and diarrhea over the past 5 days. Multiple prior abdominal surgeries. Current history of hypertension, lupus.  EXAM: ACUTE ABDOMEN SERIES (ABDOMEN 2 VIEW & CHEST 1 VIEW)  COMPARISON:  Two-view chest x-ray 03/14/2014. Acute abdomen series 02/02/2014. CT chest abdomen and pelvis 03/02/2014. Multiple prior CT abdomen and pelvis examinations prior to that.  FINDINGS: Numerous surgical clips in  the right upper quadrant abdomen and anastomotic suture material in the upper and mid abdomen. Bowel gas pattern normal without evidence of obstruction or significant ileus. Expected stool burden in the colon. No evidence of free intraperitoneal air or significant air-fluid levels on the erect image. No visible opaque urinary tract calculi. Phleboliths in the pelvis. Injection granulomata in the right buttock. Regional skeleton unremarkable.   Cardiomediastinal silhouette unremarkable, unchanged. Lungs clear. Bronchovascular markings normal. Pulmonary vascularity normal. No visible pleural effusions. No pneumothorax.  IMPRESSION: 1. No acute abdominal abnormality. 2.  No acute cardiopulmonary disease.   Electronically Signed   By: Evangeline Dakin M.D.   On: 04/24/2014 18:03    Scheduled Meds: . ARIPiprazole  5 mg Oral BID  . escitalopram  20 mg Oral Daily  . fluticasone  1 spray Each Nare Daily  . lisinopril  10 mg Oral Daily  . metoCLOPramide (REGLAN) injection  10 mg Intravenous 4 times per day  . mupirocin ointment   Nasal BID  . pantoprazole (PROTONIX) IV  40 mg Intravenous Q12H  . potassium chloride  10 mEq Intravenous Q1 Hr x 2  . sodium chloride  3 mL Intravenous Q12H  . sucralfate  1 g Oral QID   Continuous Infusions: . 0.9 % NaCl with KCl 40 mEq / L 100 mL/hr (04/25/14 4765)    Principal Problem:   Intractable nausea and vomiting Active Problems:   History of schizoaffective disorder   Personality disorder   Tobacco abuse   Alcohol abuse   History of gastric bypass   Hypothyroid   Lupus   Rectal bleeding   Gastroparesis   Acute blood loss anemia   Sinus tachycardia   Hypokalemia   Hyponatremia    Time spent: 35 minutes    [facility] Pager [phone]. If 7PM-7AM, please contact night-coverage at [email or web address], password [facility] 04/25/2014, 10:06 AM  LOS: 1 day

## 2014-04-25 NOTE — Consult Note (Signed)
Referring Provider: Dr. Anastasio Champion Primary Care Physician:  Doran Heater, MD Primary Gastroenterologist:  Dr. Gala Romney   Date of Admission: 04/24/14 Date of Consultation: 04/25/14  Reason for Consultation:  N/V, rectal bleeding, history of gastroparesis  HPI:  Angel Price is a 49 y.o. year old female well-known to our practice with a history of chronic abdominal pain, gastroparesis, ETOH abuse. Recently hospitalized late January with likely acute viral gastroenteritis, with negative GI pathogen panel. She has had multiple CT scans with referral to Pain Management recommended. Last EGD on file from Nov 2015 with residual gastric mucosa and efferent limb normal, retained gastric contents (history of Billroth II anatomy). Last colonoscopy with inadequate prep. Due for early interval colonoscopy this year.  Admitting Hgb 12.6, down to 10.2 this morning.   Notes acute onset of nausea and vomiting on Wednesday. Coffee-ground emesis. Last hematemesis yesterday evening. Tolerating clear liquids. States she was taking Protonix, Zofran, and Reglan as directed at home. Notes burgundy blood starting Wednesday, mixed in stools, clots. No diarrhea. Last evidence of blood this morning around 10am, bright red. Notes periumbilical pain, acute on chronic. +chills, no fever. Pain described as crampy and stabbing. Drinks ETOH daily, "trying to cut back", now 1-2 beers a day. Goody powder last Thursday otherwise denies use of NSAIDs, aspirin powders.   Past Medical History  Diagnosis Date  . Lupus   . 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 Hypertension   . Abscess     soft tissue  . Suicide attempt   . Blood transfusion without reported diagnosis   . Chronic abdominal pain   . Chronic wound infection of abdomen   . Adrenal mass   . Anxiety   . Depression   . GERD (gastroesophageal reflux disease)   . Sickle cell trait   . Gastroparesis Nov 2015  . Nausea and vomiting     chronic, recurrent  .  Alcohol abuse     Past Surgical History  Procedure Laterality Date  . Abdominal surgery    . Abdominal hysterectomy  2013    [place]  . Debridement of abdominal wall abscess N/A 02/08/2013    Procedure: DEBRIDEMENT OF ABDOMINAL WALL ABSCESS;  Surgeon: Jamesetta So, MD;  Location: [facility];  Service: General;  Laterality: N/A;  . Billroth ii procedure       [place], first 2000, 2005/2006.  . Colonoscopy      in [place]  . Cholecystectomy    . Colonoscopy with propofol N/A 05/20/2013    Dr.Shadrach Bartunek- inadequate prep, normal appearing rectum, grossly normal colon aside from pancolonic diverticula, normal terminal ileum bx= unremarkable colonic mucosa. Due for early interval 2016.   Marland Kitchen Esophagogastroduodenoscopy (egd) with propofol N/A 05/20/2013    Dr.Betsey Sossamon- s/p prior gastric surgery with normal esophagus, residual gastric mucosa and patent efferent limb  . Esophageal biopsy  05/20/2013    Procedure: BIOPSIES OF ASCENDING AND SIGMOID COLON;  Surgeon: Daneil Dolin, MD;  Location: [facility];  Service: Endoscopy;;  . Wound exploration Right 06/24/2013    Procedure: exploration of traumatic wound right wrist;  Surgeon: Tennis Must, MD;  Location: [facility];  Service: Orthopedics;  Laterality: Right;  . Tendon repar Right     wrist  . Adrenal turmor removal    . Esophagogastroduodenoscopy (egd) with propofol N/A 02/03/2014    Dr. Gala Romney:  s/p hemigastrectomy with retained gastric contents. Residual gastric mucosa and efferent limb appeared normal otherwise. Query gastroparesis.  Prior to Admission medications   Medication Sig Start Date End Date Taking? Authorizing Provider  ARIPiprazole (ABILIFY) 5 MG tablet Take 1 tablet (5 mg total) by mouth 2 (two) times daily. 07/16/13  Yes Levonne Spiller, MD  escitalopram (LEXAPRO) 20 MG tablet Take 1 tablet (20 mg total) by mouth daily. 07/16/13 07/16/14 Yes Levonne Spiller, MD  fluticasone (FLONASE) 50 MCG/ACT nasal spray Place 1 spray into both nostrils daily.   Yes  Historical Provider, MD  lisinopril (PRINIVIL,ZESTRIL) 10 MG tablet Take 10 mg by mouth daily.   Yes Historical Provider, MD  metoCLOPramide (REGLAN) 10 MG tablet Take 1 tablet (10 mg total) by mouth 4 (four) times daily -  before meals and at bedtime. 04/06/14  Yes Lezlie Octave Black, NP  ondansetron (ZOFRAN) 4 MG tablet Take 1 tablet (4 mg total) by mouth 3 (three) times daily. With meals Patient taking differently: Take 4 mg by mouth 3 (three) times daily as needed for nausea or vomiting. With meals 01/21/14  Yes Orvil Feil, NP  pantoprazole (PROTONIX) 40 MG tablet Take 1 tablet (40 mg total) by mouth 2 (two) times daily before a meal. 01/21/14  Yes Orvil Feil, NP  sucralfate (CARAFATE) 1 G tablet Take 1 g by mouth 4 (four) times daily.   Yes Historical Provider, MD  HYDROcodone-acetaminophen (NORCO/VICODIN) 5-325 MG per tablet Take 1 tablet by mouth every 6 (six) hours as needed for severe pain. Patient not taking: Reported on 04/24/2014 04/06/14   Radene Gunning, NP  mupirocin ointment (BACTROBAN) 2 % Place into the nose 2 (two) times daily. Patient not taking: Reported on 04/25/2014 12/13/13   Velta Addison Mikhail, DO  promethazine (PHENERGAN) 25 MG tablet Take 1 tablet (25 mg total) by mouth every 6 (six) hours as needed. Patient not taking: Reported on 04/24/2014 01/14/14   [name], MD    Current Facility-Administered Medications  Medication Dose Route Frequency Provider Last Rate Last Dose  . 0.9 % NaCl with KCl 40 mEq / L  infusion   Intravenous Continuous Rexene Alberts, MD 100 mL/hr at 04/25/14 0923 100 mL/hr at 04/25/14 0923  . ARIPiprazole (ABILIFY) tablet 5 mg  5 mg Oral BID Doree Albee, MD   5 mg at 04/25/14 0923  . escitalopram (LEXAPRO) tablet 20 mg  20 mg Oral Daily Doree Albee, MD   20 mg at 04/25/14 0906  . fluticasone (FLONASE) 50 MCG/ACT nasal spray 1 spray  1 spray Each Nare Daily Doree Albee, MD   1 spray at 04/25/14 0906  . HYDROmorphone (DILAUDID) injection 0.5 mg  0.5 mg  Intravenous Q4H PRN Lezlie Octave Black, NP      . lisinopril (PRINIVIL,ZESTRIL) tablet 10 mg  10 mg Oral Daily Doree Albee, MD   10 mg at 04/25/14 0906  . metoCLOPramide (REGLAN) injection 10 mg  10 mg Intravenous 4 times per day Doree Albee, MD   10 mg at 04/25/14 0093  . mupirocin ointment (BACTROBAN) 2 %   Nasal BID Nimish C Gosrani, MD      . ondansetron (ZOFRAN) tablet 4 mg  4 mg Oral Q6H PRN Nimish C Gosrani, MD       Or  . ondansetron (ZOFRAN) injection 4 mg  4 mg Intravenous Q6H PRN Doree Albee, MD   4 mg at 04/25/14 8182  . pantoprazole (PROTONIX) injection 40 mg  40 mg Intravenous Q12H Nimish C Anastasio Champion, MD   40 mg at 04/25/14 0906  .  sodium chloride 0.9 % injection 3 mL  3 mL Intravenous Q12H Nimish C Anastasio Champion, MD   3 mL at 04/24/14 2355  . sucralfate (CARAFATE) tablet 1 g  1 g Oral QID Doree Albee, MD   1 g at 04/25/14 9924    Allergies as of 04/24/2014 - Review Complete 04/24/2014  Allergen Reaction Noted  . Codeine Hives 02/06/2013  . Morphine and related Hives 02/06/2013    Family History  Problem Relation Age of Onset  . Brain cancer Son   . Schizophrenia Son   . Cancer Son     brain  . Breast cancer Maternal Aunt   . Bipolar disorder Maternal Aunt   . Drug abuse Maternal Aunt   . Colon cancer Maternal Grandmother     late 34s, early 73s  . Lung cancer Father   . Cancer Father     mets  . Liver disease Neg Hx   . Drug abuse Mother   . Drug abuse Sister   . Drug abuse Brother   . Bipolar disorder Paternal Grandfather   . Bipolar disorder Cousin     History   Social History  . Marital Status: Legally Separated    Spouse Name: N/A    Number of Children: N/A  . Years of Education: N/A   Occupational History  . Not on file.   Social History Main Topics  . Smoking status: Current Every Day Smoker -- 0.25 packs/day for 35 years    Types: Cigarettes  . Smokeless tobacco: Never Used  . Alcohol Use: 7.2 oz/week    12 Cans of beer, 0 Not  specified per week     Comment: ETOH abuse. as of 04/25/14, states drinking 1-2 beers per day  . Drug Use: No     Comment: quit 09/2012  . Sexual Activity: Yes    Birth Control/ Protection: Surgical   Other Topics Concern  . Not on file   Social History Narrative    Review of Systems: As mentioned in HPI.   Physical Exam: Vital signs in last 24 hours: Temp:  [98.2 F (36.8 C)-98.5 F (36.9 C)] 98.4 F (36.9 C) (02/08 0452) Pulse Rate:  [91-116] 91 (02/08 0452) Resp:  [18] 18 (02/08 0452) BP: (111-122)/(73-92) 111/73 mmHg (02/08 0452) SpO2:  [95 %-100 %] 100 % (02/08 0452) Weight:  [131 lb (59.421 kg)] 131 lb (59.421 kg) (02/07 1619) Last BM Date: 04/24/14 General:   Alert, appears weak and fatigued. Flat affect.  Head:  Normocephalic and atraumatic. Eyes:  Sclera clear, no icterus.   Conjunctiva pink. Ears:  Normal auditory acuity. Nose:  No deformity, discharge,  or lesions. Mouth:  No deformity or lesions, dentition normal. Lungs:  Clear throughout to auscultation.   No wheezes, crackles, or rhonchi. No acute distress. Heart:  Regular rate and rhythm; no murmurs Abdomen:  Soft, TTP periumbilically and nondistended. No masses, hepatosplenomegaly or hernias noted. Normal bowel sounds, without guarding, and without rebound. Gauze noted to mid-abdomen, site of chronic, non-healing superficial areas Rectal:  External hemorrhoids, non-thrombosed, no gross blood noted, internal exam incomplete due to patient discomfort Msk:  Symmetrical without gross deformities. Normal posture. Extremities:  Without edema. Neurologic:  Alert and  oriented x4;  grossly normal neurologically. Skin:  Intact without significant lesions or rashes. Psych:  Alert and cooperative.   Intake/Output from previous day:   Intake/Output this shift:    Lab Results:  Recent Labs  04/24/14 1650 04/25/14 0629  WBC 7.0 4.9  HGB 12.6 10.2*  HCT 35.9* 29.6*  PLT 292 252   BMET  Recent Labs   04/24/14 1650 04/25/14 0629  NA 134* 132*  K 3.7 3.1*  CL 99 102  CO2 19 23  GLUCOSE 70 84  BUN 5* <5*  CREATININE 0.56 0.62  CALCIUM 8.9 8.2*   LFT  Recent Labs  04/24/14 1650 04/25/14 0629  PROT 7.9 6.3  ALBUMIN 4.2 3.4*  AST 82* 48*  ALT 55* 37*  ALKPHOS 84 68  BILITOT 1.1 1.4*   PT/INR  Recent Labs  04/24/14 1650  LABPROT 13.2  INR 0.99    Studies/Results: Dg Abd Acute W/chest  04/24/2014   CLINICAL DATA:  Mid abdominal pain, vomiting and diarrhea over the past 5 days. Multiple prior abdominal surgeries. Current history of hypertension, lupus.  EXAM: ACUTE ABDOMEN SERIES (ABDOMEN 2 VIEW & CHEST 1 VIEW)  COMPARISON:  Two-view chest x-ray 03/14/2014. Acute abdomen series 02/02/2014. CT chest abdomen and pelvis 03/02/2014. Multiple prior CT abdomen and pelvis examinations prior to that.  FINDINGS: Numerous surgical clips in the right upper quadrant abdomen and anastomotic suture material in the upper and mid abdomen. Bowel gas pattern normal without evidence of obstruction or significant ileus. Expected stool burden in the colon. No evidence of free intraperitoneal air or significant air-fluid levels on the erect image. No visible opaque urinary tract calculi. Phleboliths in the pelvis. Injection granulomata in the right buttock. Regional skeleton unremarkable.  Cardiomediastinal silhouette unremarkable, unchanged. Lungs clear. Bronchovascular markings normal. Pulmonary vascularity normal. No visible pleural effusions. No pneumothorax.  IMPRESSION: 1. No acute abdominal abnormality. 2.  No acute cardiopulmonary disease.   Electronically Signed   By: Evangeline Dakin M.D.   On: 04/24/2014 18:03    Impression: 49 year old female admitted with acute on chronic nausea and vomiting in the setting of gastroparesis, hematemesis likely secondary to gastritis, esophagitis, possible PUD, and rectal bleeding with 2 gram drop in Hgb noted since admission. Anemia likely multifactorial in  this setting; will continue to monitor serial H/H. Rectal exam without overt blood noted; due for colonoscopy this year due to last years' poor prep.Will need colonoscopy/EGD with Dr. Oneida Alar tomorrow, 2/9, with Propofol due to polypharmacy and chronic ETOH abuse. As of note, acute on chronic abdominal pain noted; patient has had multiple CT imaging. Hold off on any additional imaging at this time unless worsening clinical status. No peritoneal signs noted.   Elevated transaminases in the setting of chronic ETOH abuse. Continue to monitor. Last CT Dec 2015 with probable fatty liver.   Patient believes she will be able to tolerate colonoscopy prep, as N/V have improved since admission. Will order split prep dosing and plan on procedures with Dr. Oneida Alar on 2/9.    Plan: PPI BID Reglan QID with meals and at bedtime Zofran scheduled dosing Repeat H/H at 1500 [place] 2 liters this afternoon and 2 liters in the morning Tap water enema X 2 in the morning Colonoscopy/EGD with Dr. Oneida Alar on 2/9 with Propofol Recommend outpatient follow-up of 10X 8 mm irregular density in the right upper lobe, also notable 7.6 mm right middle lobe nodule from CT Dec 2015.   Orvil Feil, ANP-BC [facility]    LOS: 1 day    04/25/2014, 12:03 PM  Attending note: Patient seen and examined. Agree with above.

## 2014-04-25 NOTE — Progress Notes (Signed)
Nutrition Brief Note  Patient identified on the Malnutrition Screening Tool (MST) Report  Wt Readings from Last 15 Encounters:  04/24/14 131 lb (59.421 kg)  04/06/14 132 lb 4.8 oz (60.011 kg)  04/01/14 122 lb 9.6 oz (55.611 kg)  03/14/14 136 lb (61.689 kg)  03/02/14 129 lb (58.514 kg)  02/02/14 129 lb (58.514 kg)  01/21/14 126 lb (57.153 kg)  01/21/14 126 lb (57.153 kg)  01/14/14 130 lb (58.968 kg)  01/03/14 134 lb (60.782 kg)  12/11/13 124 lb 1.6 oz (56.291 kg)  12/08/13 132 lb (59.875 kg)  09/29/13 131 lb (59.421 kg)  09/16/13 132 lb (59.875 kg)  08/16/13 129 lb (58.514 kg)    Body mass index is 23.21 kg/(m^2). Patient meets criteria for  based on current BMI.  She has 7% wt gain in < 30 days which is significant. Current diet order is bariatric clear. Hx of gastroenteritis, gastroparesis and ongoing alcohol abuse. N/V improved per pt. She has colonoscopy pending for tomorrow.  Labs and medications reviewed.   Colman Cater MS,RD,CSG,LDN Office: [phone] Pager: [phone]

## 2014-04-26 ENCOUNTER — Inpatient Hospital Stay (HOSPITAL_COMMUNITY): Payer: Medicare Other

## 2014-04-26 ENCOUNTER — Inpatient Hospital Stay (HOSPITAL_COMMUNITY): Payer: Medicare Other | Admitting: Anesthesiology

## 2014-04-26 ENCOUNTER — Other Ambulatory Visit: Payer: Self-pay

## 2014-04-26 ENCOUNTER — Encounter (HOSPITAL_COMMUNITY)
Admission: EM | Disposition: A | Discharge status: Home / Self Care | Payer: Self-pay | Source: Home / Self Care | Attending: Internal Medicine

## 2014-04-26 ENCOUNTER — Encounter (HOSPITAL_COMMUNITY): Payer: Self-pay | Admitting: *Deleted

## 2014-04-26 DIAGNOSIS — Z720 Tobacco use: Secondary | ICD-10-CM

## 2014-04-26 DIAGNOSIS — R911 Solitary pulmonary nodule: Secondary | ICD-10-CM | POA: Diagnosis present

## 2014-04-26 DIAGNOSIS — K449 Diaphragmatic hernia without obstruction or gangrene: Secondary | ICD-10-CM

## 2014-04-26 DIAGNOSIS — K920 Hematemesis: Secondary | ICD-10-CM | POA: Diagnosis not present

## 2014-04-26 DIAGNOSIS — K625 Hemorrhage of anus and rectum: Secondary | ICD-10-CM | POA: Diagnosis not present

## 2014-04-26 DIAGNOSIS — R110 Nausea: Secondary | ICD-10-CM

## 2014-04-26 DIAGNOSIS — K5790 Diverticulosis of intestine, part unspecified, without perforation or abscess without bleeding: Secondary | ICD-10-CM | POA: Diagnosis present

## 2014-04-26 DIAGNOSIS — D120 Benign neoplasm of cecum: Secondary | ICD-10-CM

## 2014-04-26 DIAGNOSIS — K2970 Gastritis, unspecified, without bleeding: Secondary | ICD-10-CM | POA: Diagnosis present

## 2014-04-26 DIAGNOSIS — K649 Unspecified hemorrhoids: Secondary | ICD-10-CM | POA: Diagnosis present

## 2014-04-26 DIAGNOSIS — K222 Esophageal obstruction: Secondary | ICD-10-CM

## 2014-04-26 HISTORY — PX: ESOPHAGOGASTRODUODENOSCOPY (EGD) WITH PROPOFOL: SHX5813

## 2014-04-26 HISTORY — PX: COLONOSCOPY WITH PROPOFOL: SHX5780

## 2014-04-26 HISTORY — PX: BIOPSY: SHX5522

## 2014-04-26 LAB — BASIC METABOLIC PANEL
Anion gap: 6 (ref 5–15)
BUN: <5 mg/dL — ABNORMAL LOW (ref 6–23)
CHLORIDE: 105 mmol/L (ref 96–112)
CO2: 27 mmol/L (ref 19–32)
CREATININE: 0.54 mg/dL (ref 0.50–1.10)
Calcium: 8.8 mg/dL (ref 8.4–10.5)
GFR calc Af Amer: >90 mL/min (ref >90)
GFR calc non Af Amer: >90 mL/min (ref >90)
Glucose, Bld: 102 mg/dL — ABNORMAL HIGH (ref 70–99)
Potassium: 3.9 mmol/L (ref 3.5–5.1)
Sodium: 138 mmol/L (ref 135–145)

## 2014-04-26 LAB — SURGICAL PCR SCREEN
MRSA, PCR: NEGATIVE
STAPHYLOCOCCUS AUREUS: NEGATIVE

## 2014-04-26 LAB — CBC
HCT: 29.1 % — ABNORMAL LOW (ref 36.0–46.0)
HEMOGLOBIN: 9.8 g/dL — AB (ref 12.0–15.0)
MCH: 29.4 pg (ref 26.0–34.0)
MCHC: 33.7 g/dL (ref 30.0–36.0)
MCV: 87.4 fL (ref 78.0–100.0)
Platelets: 221 10*3/uL (ref 150–400)
RBC: 3.33 MIL/uL — AB (ref 3.87–5.11)
RDW: 16.8 % — ABNORMAL HIGH (ref 11.5–15.5)
WBC: 5.6 10*3/uL (ref 4.0–10.5)

## 2014-04-26 LAB — MAGNESIUM: MAGNESIUM: 1.5 mg/dL (ref 1.5–2.5)

## 2014-04-26 SURGERY — COLONOSCOPY WITH PROPOFOL
Anesthesia: Monitor Anesthesia Care

## 2014-04-26 MED ORDER — PROPOFOL 10 MG/ML IV BOLUS
INTRAVENOUS | Status: AC
  Filled 2014-04-26: qty 20

## 2014-04-26 MED ORDER — LIDOCAINE VISCOUS 2 % MT SOLN
5.0000 mL | Freq: Two times a day (BID) | OROMUCOSAL | Status: DC
  Administered 2014-04-26 (×2): 5 mL via OROMUCOSAL

## 2014-04-26 MED ORDER — STERILE WATER FOR IRRIGATION IR SOLN
Status: DC | PRN
  Administered 2014-04-26: 1000 mL

## 2014-04-26 MED ORDER — HYDRALAZINE HCL 20 MG/ML IJ SOLN
5.0000 mg | Freq: Once | INTRAMUSCULAR | Status: AC
  Administered 2014-04-26: 5 mg via INTRAVENOUS
  Filled 2014-04-26: qty 1

## 2014-04-26 MED ORDER — GLYCOPYRROLATE 0.2 MG/ML IJ SOLN
0.2000 mg | Freq: Once | INTRAMUSCULAR | Status: AC
  Administered 2014-04-26: 0.2 mg via INTRAVENOUS

## 2014-04-26 MED ORDER — FENTANYL CITRATE 0.05 MG/ML IJ SOLN
INTRAMUSCULAR | Status: AC
  Filled 2014-04-26: qty 2

## 2014-04-26 MED ORDER — FENTANYL CITRATE 0.05 MG/ML IJ SOLN: 25.0000 ug | INTRAMUSCULAR | Status: DC | PRN

## 2014-04-26 MED ORDER — PHENOL 1.4 % MT LIQD: 1.0000 | OROMUCOSAL | Status: DC | PRN

## 2014-04-26 MED ORDER — LACTATED RINGERS IV SOLN
INTRAVENOUS | Status: DC
  Administered 2014-04-26: 11:00:00 via INTRAVENOUS

## 2014-04-26 MED ORDER — PANTOPRAZOLE SODIUM 40 MG PO TBEC
40.0000 mg | DELAYED_RELEASE_TABLET | Freq: Two times a day (BID) | ORAL | Status: DC
  Administered 2014-04-26 – 2014-04-27 (×3): 40 mg via ORAL
  Filled 2014-04-26 (×3): qty 1

## 2014-04-26 MED ORDER — LIDOCAINE VISCOUS 2 % MT SOLN
OROMUCOSAL | Status: AC
  Filled 2014-04-26: qty 15

## 2014-04-26 MED ORDER — PROPOFOL 10 MG/ML IV BOLUS
INTRAVENOUS | Status: DC | PRN
  Administered 2014-04-26: 50 mg via INTRAVENOUS
  Administered 2014-04-26: 100 mg via INTRAVENOUS

## 2014-04-26 MED ORDER — MIDAZOLAM HCL 2 MG/2ML IJ SOLN
INTRAMUSCULAR | Status: AC
  Filled 2014-04-26: qty 2

## 2014-04-26 MED ORDER — SODIUM CHLORIDE 0.9 % IV SOLN: INTRAVENOUS | Status: DC

## 2014-04-26 MED ORDER — GLYCOPYRROLATE 0.2 MG/ML IJ SOLN
INTRAMUSCULAR | Status: AC
  Filled 2014-04-26: qty 1

## 2014-04-26 MED ORDER — LIDOCAINE HCL (PF) 1 % IJ SOLN
INTRAMUSCULAR | Status: AC
  Filled 2014-04-26: qty 5

## 2014-04-26 MED ORDER — HYDROCOD POLST-CHLORPHEN POLST 10-8 MG/5ML PO LQCR
5.0000 mL | Freq: Two times a day (BID) | ORAL | Status: DC | PRN
  Administered 2014-04-26 – 2014-04-27 (×2): 5 mL via ORAL
  Filled 2014-04-26 (×2): qty 5

## 2014-04-26 MED ORDER — LACTATED RINGERS IV SOLN: INTRAVENOUS | Status: DC | PRN

## 2014-04-26 MED ORDER — MIDAZOLAM HCL 2 MG/2ML IJ SOLN
1.0000 mg | INTRAMUSCULAR | Status: DC | PRN
  Administered 2014-04-26 (×2): 2 mg via INTRAVENOUS
  Filled 2014-04-26: qty 2

## 2014-04-26 MED ORDER — ONDANSETRON HCL 4 MG/2ML IJ SOLN
4.0000 mg | Freq: Once | INTRAMUSCULAR | Status: AC
  Administered 2014-04-26: 4 mg via INTRAVENOUS

## 2014-04-26 MED ORDER — LIDOCAINE HCL (CARDIAC) 10 MG/ML IV SOLN
INTRAVENOUS | Status: DC | PRN
  Administered 2014-04-26: 50 mg via INTRAVENOUS

## 2014-04-26 MED ORDER — METOCLOPRAMIDE HCL 10 MG PO TABS
10.0000 mg | ORAL_TABLET | Freq: Three times a day (TID) | ORAL | Status: DC
  Administered 2014-04-26 – 2014-04-27 (×5): 10 mg via ORAL
  Filled 2014-04-26 (×5): qty 1

## 2014-04-26 MED ORDER — ONDANSETRON HCL 4 MG/2ML IJ SOLN: 4.0000 mg | Freq: Once | INTRAMUSCULAR | Status: DC | PRN

## 2014-04-26 MED ORDER — FENTANYL CITRATE 0.05 MG/ML IJ SOLN
25.0000 ug | INTRAMUSCULAR | Status: AC
  Administered 2014-04-26 (×2): 25 ug via INTRAVENOUS

## 2014-04-26 MED ORDER — LACTATED RINGERS IV SOLN
INTRAVENOUS | Status: DC | PRN
  Administered 2014-04-26 (×2): via INTRAVENOUS

## 2014-04-26 MED ORDER — PROPOFOL INFUSION 10 MG/ML OPTIME
INTRAVENOUS | Status: DC | PRN
  Administered 2014-04-26: 150 ug/kg/min via INTRAVENOUS

## 2014-04-26 MED ORDER — ONDANSETRON HCL 4 MG/2ML IJ SOLN
INTRAMUSCULAR | Status: AC
  Filled 2014-04-26: qty 2

## 2014-04-26 SURGICAL SUPPLY — 15 items
BLOCK BITE 60FR ADLT L/F BLUE (MISCELLANEOUS) ×1 IMPLANT
ELECT REM PT RETURN 9FT ADLT (ELECTROSURGICAL)
ELECTRODE REM PT RTRN 9FT ADLT (ELECTROSURGICAL) IMPLANT
FLOOR PAD 36X40 (MISCELLANEOUS) ×3
FORCEPS BIOP RAD 4 LRG CAP 4 (CUTTING FORCEPS) ×1 IMPLANT
FORMALIN 10 PREFIL 20ML (MISCELLANEOUS) ×2 IMPLANT
KIT CLEAN ENDO COMPLIANCE (KITS) ×3 IMPLANT
LUBRICANT JELLY 4.5OZ STERILE (MISCELLANEOUS) ×1 IMPLANT
MANIFOLD NEPTUNE II (INSTRUMENTS) ×3 IMPLANT
PAD FLOOR 36X40 (MISCELLANEOUS) ×2 IMPLANT
SNARE SHORT THROW 13M SML OVAL (MISCELLANEOUS) ×3 IMPLANT
SYR 50ML LL SCALE MARK (SYRINGE) ×1 IMPLANT
TRAP SPECIMEN MUCOUS 40CC (MISCELLANEOUS) ×1 IMPLANT
TUBING IRRIGATION ENDOGATOR (MISCELLANEOUS) ×1 IMPLANT
WATER STERILE IRR 1000ML POUR (IV SOLUTION) ×1 IMPLANT

## 2014-04-26 NOTE — H&P (Signed)
Primary Care Physician:  Doran Heater, MD Primary Gastroenterologist:  Dr. Gala Romney  Pre-Procedure History & Physical: HPI:  Angel Price is a 49 y.o. female here for NAUSEA/VOMTIING/DIARRHEA/ADBOMINAL PAIN.  Past Medical History  Diagnosis Date  . Lupus   . 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 Hypertension   . Abscess     soft tissue  . Suicide attempt   . Blood transfusion without reported diagnosis   . Chronic abdominal pain   . Chronic wound infection of abdomen   . Adrenal mass   . Anxiety   . Depression   . GERD (gastroesophageal reflux disease)   . Sickle cell trait   . Gastroparesis Nov 2015  . Nausea and vomiting     chronic, recurrent  . Alcohol abuse   . History of Billroth II operation   . Lung nodule < 6cm on CT 04/25/2014    Past Surgical History  Procedure Laterality Date  . Abdominal surgery    . Abdominal hysterectomy  2013    [place]  . Debridement of abdominal wall abscess N/A 02/08/2013    Procedure: DEBRIDEMENT OF ABDOMINAL WALL ABSCESS;  Surgeon: Jamesetta So, MD;  Location: [facility];  Service: General;  Laterality: N/A;  . Billroth ii procedure       [place], first 2000, 2005/2006.  . Colonoscopy      in [place]  . Cholecystectomy    . Colonoscopy with propofol N/A 05/20/2013    Dr.Rourk- inadequate prep, normal appearing rectum, grossly normal colon aside from pancolonic diverticula, normal terminal ileum bx= unremarkable colonic mucosa. Due for early interval 2016.   Marland Kitchen Esophagogastroduodenoscopy (egd) with propofol N/A 05/20/2013    Dr.Rourk- s/p prior gastric surgery with normal esophagus, residual gastric mucosa and patent efferent limb  . Esophageal biopsy  05/20/2013    Procedure: BIOPSIES OF ASCENDING AND SIGMOID COLON;  Surgeon: Daneil Dolin, MD;  Location: [facility];  Service: Endoscopy;;  . Wound exploration Right 06/24/2013    Procedure: exploration of traumatic wound right wrist;  Surgeon: Tennis Must, MD;  Location: [facility];  Service: Orthopedics;  Laterality: Right;  . Tendon repar Right     wrist  . Adrenal turmor removal    . Esophagogastroduodenoscopy (egd) with propofol N/A 02/03/2014    Dr. Gala Romney:  s/p hemigastrectomy with retained gastric contents. Residual gastric mucosa and efferent limb appeared normal otherwise. Query gastroparesis.     Prior to Admission medications   Medication Sig Start Date End Date Taking? Authorizing Provider  ARIPiprazole (ABILIFY) 5 MG tablet Take 1 tablet (5 mg total) by mouth 2 (two) times daily. 07/16/13  Yes Levonne Spiller, MD  escitalopram (LEXAPRO) 20 MG tablet Take 1 tablet (20 mg total) by mouth daily. 07/16/13 07/16/14 Yes Levonne Spiller, MD  fluticasone (FLONASE) 50 MCG/ACT nasal spray Place 1 spray into both nostrils daily.   Yes Historical Provider, MD  lisinopril (PRINIVIL,ZESTRIL) 10 MG tablet Take 10 mg by mouth daily.   Yes Historical Provider, MD  metoCLOPramide (REGLAN) 10 MG tablet Take 1 tablet (10 mg total) by mouth 4 (four) times daily -  before meals and at bedtime. 04/06/14  Yes Lezlie Octave Black, NP  ondansetron (ZOFRAN) 4 MG tablet Take 1 tablet (4 mg total) by mouth 3 (three) times daily. With meals Patient taking differently: Take 4 mg by mouth 3 (three) times daily as needed for nausea or vomiting. With meals 01/21/14  Yes Orvil Feil, NP  pantoprazole (PROTONIX) 40 MG tablet Take 1 tablet (40 mg total) by mouth 2 (two) times daily before a meal. 01/21/14  Yes Orvil Feil, NP  sucralfate (CARAFATE) 1 G tablet Take 1 g by mouth 4 (four) times daily.   Yes Historical Provider, MD  HYDROcodone-acetaminophen (NORCO/VICODIN) 5-325 MG per tablet Take 1 tablet by mouth every 6 (six) hours as needed for severe pain. Patient not taking: Reported on 04/24/2014 04/06/14   Radene Gunning, NP  mupirocin ointment (BACTROBAN) 2 % Place into the nose 2 (two) times daily. Patient not taking: Reported on 04/25/2014 12/13/13   Velta Addison Mikhail, DO  promethazine (PHENERGAN) 25 MG tablet  Take 1 tablet (25 mg total) by mouth every 6 (six) hours as needed. Patient not taking: Reported on 04/24/2014 01/14/14   [name], MD    Allergies as of 04/24/2014 - Review Complete 04/24/2014  Allergen Reaction Noted  . Codeine Hives 02/06/2013  . Morphine and related Hives 02/06/2013    Family History  Problem Relation Age of Onset  . Brain cancer Son   . Schizophrenia Son   . Cancer Son     brain  . Breast cancer Maternal Aunt   . Bipolar disorder Maternal Aunt   . Drug abuse Maternal Aunt   . Colon cancer Maternal Grandmother     late 54s, early 48s  . Lung cancer Father   . Cancer Father     mets  . Liver disease Neg Hx   . Drug abuse Mother   . Drug abuse Sister   . Drug abuse Brother   . Bipolar disorder Paternal Grandfather   . Bipolar disorder Cousin     History   Social History  . Marital Status: Legally Separated    Spouse Name: N/A    Number of Children: N/A  . Years of Education: N/A   Occupational History  . Not on file.   Social History Main Topics  . Smoking status: Current Every Day Smoker -- 0.25 packs/day for 35 years    Types: Cigarettes  . Smokeless tobacco: Never Used  . Alcohol Use: 7.2 oz/week    12 Cans of beer, 0 Not specified per week     Comment: ETOH abuse. as of 04/25/14, states drinking 1-2 beers per day  . Drug Use: No     Comment: quit 09/2012  . Sexual Activity: Yes    Birth Control/ Protection: Surgical   Other Topics Concern  . Not on file   Social History Narrative    Review of Systems: See HPI, otherwise negative ROS   Physical Exam: BP 120/86 mmHg  Pulse 91  Temp(Src) 98.8 F (37.1 C) (Oral)  Resp 18  Ht 5\' 3"  (1.6 m)  Wt 131 lb (59.421 kg)  BMI 23.21 kg/m2  SpO2 100% General:   Alert,  pleasant and cooperative in NAD Head:  Normocephalic and atraumatic. Neck:  Supple; Lungs:  Clear throughout to auscultation.    Heart:  Regular rate and rhythm. Abdomen:  Soft, nontender and nondistended. Normal bowel  sounds, without guarding, and without rebound.   Neurologic:  Alert and  oriented x4;  grossly normal neurologically.  Impression/Plan:    NAUSEA/VOMTIING/DIARRHEA/ADBOMINAL PAIN  PLAN: EGD/TCS TODAY

## 2014-04-26 NOTE — Op Note (Signed)
[facility] [address] [place], [address]   ENDOSCOPY PROCEDURE REPORT  PATIENT: Angel Price, Angel Price  MR#: #[id number] BIRTHDATE: 05/07/2011 , 3  yrs. old GENDER: female  ENDOSCOPIST: Danie Binder, MD REFERRED MG:[id number] Clydene Laming, M.D.  Garfield Cornea, M.D.  PROCEDURE DATE: May 07, 2014 PROCEDURE:   EGD, diagnostic  INDICATIONS:hematemesis.   diarrhea. MEDICATIONS: Monitored anesthesia care TOPICAL ANESTHETIC:   Viscous Xylocaine ASA CLASS:  DESCRIPTION OF PROCEDURE:     Physical exam was performed.  Informed consent was obtained from the patient after explaining the benefits, risks, and alternatives to the procedure.  The patient was connected to the monitor and placed in the left lateral position.  Continuous oxygen was provided by nasal cannula and IV medicine administered through an indwelling cannula.  After administration of sedation, the patients esophagus was intubated and the     endoscope was advanced under direct visualization to the second portion of the duodenum.  The scope was removed slowly by carefully examining the color, texture, anatomy, and integrity of the mucosa on the way out.  The patient was recovered in endoscopy and discharged home in satisfactory condition.   ESOPHAGUS: SCHATZKI'S ring(patent).   STOMACH: A small hiatal hernia was noted.   Mild non-erosive gastritis (inflammation) was found in the cardia and gastric fundus.   GASTRIC REMNANT  6 CM IN LENGTH. ANASTOMOSIS APPEARS NORMAL BUT MUCOSA FRIABLE AND BLED EASILY NEAR STAPLE LINE.  NO ULCER SEEN.   DUODENUM: NORMAL SMALL BOWEL MUCOSA.  COMPLICATIONS: There were no immediate complications.  ENDOSCOPIC IMPRESSION: 1.   SCHATZKI'S ring(patent) 2.   Small hiatal hernia 3.   MILD Non-erosive gastritis 4.   NORMAL ANASTOMOSIS 5.   NO OBVIOUS SOURCE FOR HEMATEMESIS IDENTIFIED.  RECOMMENDATIONS: BID PPI AWAIT BIOPSY DYSPHAGIA 2 DIET CONSIDER HEMORRHOID BANDING OR  SURGERY  REPEAT EXAM: eSigned:  Danie Binder, MD May 07, 2014 2:06 PM   CPT CODES: ICD CODES:  The ICD and CPT codes recommended by this software are interpretations from the data that the clinical staff has captured with the software.  The verification of the translation of this report to the ICD and CPT codes and modifiers is the sole responsibility of the health care institution and practicing physician where this report was generated.  [place]. will not be held responsible for the validity of the ICD and CPT codes included on this report.  AMA assumes no liability for data contained or not contained herein. CPT is a Designer, television/film set of the [employer].

## 2014-04-26 NOTE — Transfer of Care (Signed)
Immediate Anesthesia Transfer of Care Note  Patient: Angel Price  Procedure(s) Performed: Procedure(s): COLONOSCOPY WITH PROPOFOL (Procedure #1) At cecum at 1232   total withdrawal time=14 minutes (N/A) ESOPHAGOGASTRODUODENOSCOPY (EGD) WITH PROPOFOL (Procedure #2) (N/A) BIOPSIES  Patient Location: PACU  Anesthesia Type:MAC  Level of Consciousness: awake, alert , patient cooperative and responds to stimulation  Airway & Oxygen Therapy: Patient Spontanous Breathing and Patient connected to nasal cannula oxygen  Post-op Assessment: Report given to RN, Post -op Vital signs reviewed and stable, Patient moving all extremities and Patient moving all extremities X 4  Post vital signs: Reviewed and stable  Last Vitals:  Filed Vitals:   04/26/14 1215  BP: 120/89  Pulse:   Temp:   Resp: 25    Complications: No apparent anesthesia complications

## 2014-04-26 NOTE — Op Note (Signed)
[facility] [address] [place], [address]   COLONOSCOPY PROCEDURE REPORT  PATIENT: Angel Price, Angel Price  MR#: #[id number] BIRTHDATE: May 26, 2011 , 3  yrs. old GENDER: female ENDOSCOPIST: Danie Binder, MD REFERRED TM:[id number] Clydene Laming, M.D.  Garfield Cornea, M.D. PROCEDURE DATE:  2014/05/26 PROCEDURE:   Colonoscopy with biopsy and Colonoscopy with cold biopsy polypectomy INDICATIONS:hematochezia and unexplained diarrhea. MEDICATIONS: Monitored anesthesia care  DESCRIPTION OF PROCEDURE:    Physical exam was performed.  Informed consent was obtained from the patient after explaining the benefits, risks, and alternatives to procedure.  The patient was connected to monitor and placed in left lateral position. Continuous oxygen was provided by nasal cannula and IV medicine administered through an indwelling cannula.  After administration of sedation and rectal exam, the patients rectum was intubated and the     colonoscope was advanced under direct visualization to the ileum.  The scope was removed slowly by carefully examining the color, texture, anatomy, and integrity mucosa on the way out.  The patient was recovered in endoscopy and discharged home in satisfactory condition.   . COLON FINDINGS: The examined terminal ileum appeared to be normal. , A sessile polyp measuring 4 mm in size was found at the cecum.  A polypectomy was performed. RANDOM BIOPSIES OBTAINED TO EVALUATE FOR MICROSCOPIC COLITIS , There was moderate diverticulosis noted in the sigmoid colon, descending colon, and transverse colon with associated muscular hypertrophy and angulation.  , and Large internal hemorrhoids were found.  PREP QUALITY: good CECAL W/D TIME: 14       minutes   COMPLICATIONS: None  ENDOSCOPIC IMPRESSION: 1.  Normal ILEUM 2.  ONE COLON POLYP REMOVED 3.   Moderate PAN-COLONIC Diverticulosis 4.   RECTAL BLEEDING DUE TO Large internal hemorrhoids  RECOMMENDATIONS: AWAIT  BIOPSY BID PPI DYSPHAGIA 2 DIET PROCEED WITH EGD CONSIDER HEMORRHOID BANDING OR SURGERY   eSigned:  Danie Binder, MD May 26, 2014 2:00 PM   CPT CODES: ICD CODES:  The ICD and CPT codes recommended by this software are interpretations from the data that the clinical staff has captured with the software.  The verification of the translation of this report to the ICD and CPT codes and modifiers is the sole responsibility of the health care institution and practicing physician where this report was generated.  [place]. will not be held responsible for the validity of the ICD and CPT codes included on this report.  AMA assumes no liability for data contained or not contained herein. CPT is a Designer, television/film set of the [employer].

## 2014-04-26 NOTE — Progress Notes (Signed)
Arousing. Oriented to place per nurse. Swallowing without difficulty.

## 2014-04-26 NOTE — Progress Notes (Signed)
Patient given tap water enemas x2 after completing golytely per order. Enemas returned clear liquid. Patient tolerated well.

## 2014-04-26 NOTE — Anesthesia Procedure Notes (Signed)
Procedure Name: MAC Date/Time: 04/26/2014 12:17 PM Performed by: Michele Rockers Pre-anesthesia Checklist: Patient identified, Emergency Drugs available, Suction available, Timeout performed and Patient being monitored Patient Re-evaluated:Patient Re-evaluated prior to inductionOxygen Delivery Method: Non-rebreather mask

## 2014-04-26 NOTE — Progress Notes (Signed)
[facility] PROGRESS NOTE  Angel Price XBW:[id number] DOB: [birth year] DOA: 04/24/2014 PCP: Doran Heater, MD  Assessment/Plan: 1. Intractable nausea and vomiting. Chronic secondary to gastroparesis in setting of continued ETOH abuse. s/p EGG today revealing non-erosive gastritis. Continue protonix and reglan.toplerating soft diet at time of exam.   2. Internal hemorrhoids per colonoscopy today. No further bloody stools. Hg 9.8. Will continue PPI per GI recommendations. GI also recommend considering hemorrhoid banding. Monitor Hg.   3. Acute blood loss anemia: in setting of chronic anemia related to chronic disease and ETOH abuse and acute bleeding likely  from internal hemorrhoids. S/p colonoscopy revealing large internal hemorrhoids.  Hg 9.8. Tachycardia resolved. Monitor.   4. Gastroparesis. Treatment as above for #1. 5. Hematemesis: no further episodes. EGD today with no source identified for hematemesis. Results reveal small hiatal hernia, non-erosive gastritis and Schatzki's ring (patent). Continue PPI BID. Soft diet. Monitor.  6. Hypokalemia: related to #1. Resolved this am. Magnesium level 1.5. monitor  7. Alcohol abuse: no s/sx withdrawal. SW consult 8. Bipolar disorder. Stable at baseline 9. Abdominal pain: chronic likely related to above. Monitor pain med as indicated 10. HTN: BP soft yesterday but slightly HTN today with DBP range 83-96. Continue lisinopril. IV fluids decreased.  11. Lung nodule: CT chest 02/2014 revealed right upper and middle lobe lung nodule. Will need follow up CT in 1-2 months.   Code Status: full Family Communication: none present Disposition Plan: home hopefully in am   Consultants:  GI  Procedures:  EGD 2/16  Colonoscopy 2/16  Antibiotics:  none  HPI/Subjective: Sitting up in bed watching TV  Objective: Filed Vitals:   04/26/14 1443  BP: 133/93  Pulse: 107  Temp: 98.2 F (36.8 C)  Resp: 18    Intake/Output Summary  (Last 24 hours) at 04/26/14 1524 Last data filed at 04/26/14 1300  Gross per 24 hour  Intake   5158 ml  Output      0 ml  Net   5158 ml   Filed Weights   04/24/14 1619  Weight: 59.421 kg (131 lb)    Exam:   General:  Well nourished appears comfortable  Cardiovascular: RRR no MGR no LE edema  Respiratory: normal effort BS distant but clear. No wheeze   Abdomen: soft +BS non-distended non-tender   Musculoskeletal: joints without swelling or erythema   Data Reviewed: Basic Metabolic Panel:  Recent Labs Lab 04/24/14 1650 04/25/14 0629 04/26/14 0619  NA 134* 132* 138  K 3.7 3.1* 3.9  CL 99 102 105  CO2 19 23 27   GLUCOSE 70 84 102*  BUN 5* <5* <5*  CREATININE 0.56 0.62 0.54  CALCIUM 8.9 8.2* 8.8  MG  --   --  1.5   Liver Function Tests:  Recent Labs Lab 04/24/14 1650 04/25/14 0629  AST 82* 48*  ALT 55* 37*  ALKPHOS 84 68  BILITOT 1.1 1.4*  PROT 7.9 6.3  ALBUMIN 4.2 3.4*   No results for input(s): LIPASE, AMYLASE in the last 168 hours. No results for input(s): AMMONIA in the last 168 hours. CBC:  Recent Labs Lab 04/24/14 1650 04/25/14 0629 04/25/14 1527 04/26/14 0619  WBC 7.0 4.9 6.0 5.6  NEUTROABS 4.7  --   --   --   HGB 12.6 10.2* 10.1* 9.8*  HCT 35.9* 29.6* 29.2* 29.1*  MCV 85.3 85.8 86.4 87.4  PLT 292 252 225 221   Cardiac Enzymes: No results for input(s): CKTOTAL, CKMB, CKMBINDEX, TROPONINI in the  last 168 hours. BNP (last 3 results)  Recent Labs  03/14/14 1054  BNP 13.0    ProBNP (last 3 results) No results for input(s): PROBNP in the last 8760 hours.  CBG: No results for input(s): GLUCAP in the last 168 hours.  Recent Results (from the past 240 hour(s))  Wound culture     Status: None (Preliminary result)   Collection Time: 04/25/14  7:14 AM  Result Value Ref Range Status   Specimen Description WOUND ABDOMEN  Final   Special Requests NONE  Final   Gram Stain   Final    MODERATE WBC PRESENT,BOTH PMN AND MONONUCLEAR NO  SQUAMOUS EPITHELIAL CELLS SEEN NO ORGANISMS SEEN Performed at [facility]    Culture   Final    NO GROWTH 1 DAY Performed at [facility]    Report Status PENDING  Incomplete  MRSA PCR Screening     Status: None   Collection Time: 04/25/14  9:20 AM  Result Value Ref Range Status   MRSA by PCR NEGATIVE NEGATIVE Final    Comment:        The GeneXpert MRSA Assay (FDA approved for NASAL specimens only), is one component of a comprehensive MRSA colonization surveillance program. It is not intended to diagnose MRSA infection nor to guide or monitor treatment for MRSA infections.   Surgical pcr screen     Status: None   Collection Time: 04/25/14  9:56 PM  Result Value Ref Range Status   MRSA, PCR NEGATIVE NEGATIVE Final   Staphylococcus aureus NEGATIVE NEGATIVE Final    Comment:        The Xpert SA Assay (FDA approved for NASAL specimens in patients over 38 years of age), is one component of a comprehensive surveillance program.  Test performance has been validated by [facility] for patients greater than or equal to 29 year old. It is not intended to diagnose infection nor to guide or monitor treatment.      Studies: Dg Abd Acute W/chest  04/24/2014   CLINICAL DATA:  Mid abdominal pain, vomiting and diarrhea over the past 5 days. Multiple prior abdominal surgeries. Current history of hypertension, lupus.  EXAM: ACUTE ABDOMEN SERIES (ABDOMEN 2 VIEW & CHEST 1 VIEW)  COMPARISON:  Two-view chest x-ray 03/14/2014. Acute abdomen series 02/02/2014. CT chest abdomen and pelvis 03/02/2014. Multiple prior CT abdomen and pelvis examinations prior to that.  FINDINGS: Numerous surgical clips in the right upper quadrant abdomen and anastomotic suture material in the upper and mid abdomen. Bowel gas pattern normal without evidence of obstruction or significant ileus. Expected stool burden in the colon. No evidence of free intraperitoneal air or significant air-fluid levels on  the erect image. No visible opaque urinary tract calculi. Phleboliths in the pelvis. Injection granulomata in the right buttock. Regional skeleton unremarkable.  Cardiomediastinal silhouette unremarkable, unchanged. Lungs clear. Bronchovascular markings normal. Pulmonary vascularity normal. No visible pleural effusions. No pneumothorax.  IMPRESSION: 1. No acute abdominal abnormality. 2.  No acute cardiopulmonary disease.   Electronically Signed   By: Evangeline Dakin M.D.   On: 04/24/2014 18:03    Scheduled Meds: . ARIPiprazole  5 mg Oral BID  . escitalopram  20 mg Oral Daily  . fluticasone  1 spray Each Nare Daily  . lisinopril  10 mg Oral Daily  . metoCLOPramide  10 mg Oral TID AC & HS  . mupirocin ointment   Nasal BID  . nicotine  14 mg Transdermal Daily  . ondansetron (ZOFRAN)  IV  4 mg Intravenous TID AC, HS, 0200  . pantoprazole  40 mg Oral BID AC  . sodium chloride  3 mL Intravenous Q12H   Continuous Infusions: . 0.9 % NaCl with KCl 40 mEq / L 50 mL/hr (04/26/14 1505)    Principal Problem:   Intractable nausea and vomiting Active Problems:   History of schizoaffective disorder   Tobacco abuse   Alcohol abuse   History of gastric bypass   Hypothyroid   Lupus   Rectal bleeding   Gastroparesis   Acute blood loss anemia   Hypokalemia   Hyponatremia   Hematemesis with nausea   Lung nodule < 6cm on CT   Hemorrhoids   Diverticulosis   Gastritis   Hiatal hernia   Schatzki's ring    Time spent: 35 minutes    [facility] Pager [phone]. If 7PM-7AM, please contact night-coverage at [email or web address], password [facility] 04/26/2014, 3:24 PM  LOS: 2 days

## 2014-04-26 NOTE — Clinical Social Work Psychosocial (Signed)
Clinical Social Work Department BRIEF PSYCHOSOCIAL ASSESSMENT 04/26/2014  Patient:  Angel Price, Angel Price     Account Number:  [id number]     Admit date:  04/24/2014  Clinical Social Worker:  Wyatt Haste  Date/Time:  04/26/2014 10:22 AM  Referred by:  Physician  Date Referred:  04/26/2014 Referred for  Substance Abuse   Other Referral:   Interview type:  Patient Other interview type:    PSYCHOSOCIAL DATA Living Status:  FAMILY Admitted from facility:   Level of care:   Primary support name:  Hoyle Sauer Primary support relationship to patient:  PARENT Degree of support available:   supportive per pt    CURRENT CONCERNS Current Concerns  Substance Abuse   Other Concerns:    SOCIAL WORK ASSESSMENT / PLAN CSW met with pt at bedside following referral for substance abuse. Pt alert and oriented and reports she lives with her mother and step-father who are also her best support. Pt is on disability.    Pt reports she started drinking about 10 years ago. She denies any current drug use, but said she went to inpatient rehab at 49 years old due to cocaine abuse. Pt states that she drinks about 12-18 cans of beer a day and rarely drinks any liquor. She said when she is feeling sick, she tends to not drink. Pt tends to sit in her room and drink most of the day while watching television. She describes a strong family history of alcoholism and her family that she lives with also drink. Pt has had very brief periods of of sobriety. She is interested in stopping. Pt expresses, "I am mad at myself every day when I drink." She is open to attending [place] meeting and a schedule was provided to her. SBIRT completed and pt scored 24, indicating high risk drinking. This was discussed with pt.    Pt said she has diagnosis of Schizoaffective disorder and personality disorder. Pt was seeing Dr. Harrington Challenger at [facility], but stopped going. She plans to make appointment as she wants to start her medications again and  start therapy.   Assessment/plan status:  Referral to [employer] Other assessment/ plan:   Information/referral to community resources:   Rethinking Drinking booklet  [facility]  [facility] meeting schedule    PATIENT'S/FAMILY'S RESPONSE TO PLAN OF CARE: Pt open to beginning outpatient treatment. Resources provided. CSW will sign off, but can be reconsulted if needed.       [name], [place]

## 2014-04-26 NOTE — Anesthesia Preprocedure Evaluation (Signed)
Anesthesia Evaluation  Patient identified by MRN, date of birth, ID band Patient awake    Reviewed: Allergy & Precautions, H&P , NPO status , Patient's Chart, lab work & pertinent test results  History of Anesthesia Complications Negative for: history of anesthetic complications  Airway Mallampati: I  TM Distance: >3 FB     Dental  (+) Poor Dentition, Chipped, Missing, Dental Advisory Given,    Pulmonary Current Smoker,  breath sounds clear to auscultation        Cardiovascular hypertension, Pt. on medications Rhythm:Regular Rate:Normal     Neuro/Psych  Headaches, PSYCHIATRIC DISORDERS Anxiety Depression Bipolar Disorder    GI/Hepatic   Endo/Other  Diabetes: s/p  I131 Rx.Hypothyroidism   Renal/GU      Musculoskeletal   Abdominal   Peds  Hematology  (+) anemia ,   Anesthesia Other Findings   Reproductive/Obstetrics                             Anesthesia Physical Anesthesia Plan  ASA: III  Anesthesia Plan: MAC   Post-op Pain Management:    Induction: Intravenous  Airway Management Planned: Simple Face Mask  Additional Equipment:   Intra-op Plan:   Post-operative Plan:   Informed Consent: I have reviewed the patients History and Physical, chart, labs and discussed the procedure including the risks, benefits and alternatives for the proposed anesthesia with the patient or authorized representative who has indicated his/her understanding and acceptance.     Plan Discussed with:   Anesthesia Plan Comments:         Anesthesia Quick Evaluation

## 2014-04-26 NOTE — Anesthesia Postprocedure Evaluation (Signed)
  Anesthesia Post-op Note  Patient: Angel Price  Procedure(s) Performed: Procedure(s): COLONOSCOPY WITH PROPOFOL (Procedure #1) At cecum at 1232   total withdrawal time=14 minutes (N/A) ESOPHAGOGASTRODUODENOSCOPY (EGD) WITH PROPOFOL (Procedure #2) (N/A) BIOPSIES  Patient Location: PACU  Anesthesia Type:MAC  Level of Consciousness: awake, alert , patient cooperative and responds to stimulation  Airway and Oxygen Therapy: Patient Spontanous Breathing and Patient connected to nasal cannula oxygen  Post-op Pain: none  Post-op Assessment: Post-op Vital signs reviewed, Patient's Cardiovascular Status Stable, Respiratory Function Stable, Patent Airway, No signs of Nausea or vomiting and Pain level controlled  Post-op Vital Signs: Reviewed and stable  Last Vitals:  Filed Vitals:   04/26/14 1215  BP: 120/89  Pulse:   Temp:   Resp: 25    Complications: No apparent anesthesia complications

## 2014-04-27 ENCOUNTER — Encounter (HOSPITAL_COMMUNITY): Payer: Self-pay | Admitting: Gastroenterology

## 2014-04-27 DIAGNOSIS — R7989 Other specified abnormal findings of blood chemistry: Secondary | ICD-10-CM

## 2014-04-27 DIAGNOSIS — K649 Unspecified hemorrhoids: Secondary | ICD-10-CM

## 2014-04-27 LAB — BASIC METABOLIC PANEL
Anion gap: 9 (ref 5–15)
BUN: <5 mg/dL — ABNORMAL LOW (ref 6–23)
CALCIUM: 9.3 mg/dL (ref 8.4–10.5)
CO2: 28 mmol/L (ref 19–32)
Chloride: 104 mmol/L (ref 96–112)
Creatinine, Ser: 0.55 mg/dL (ref 0.50–1.10)
GFR calc Af Amer: >90 mL/min (ref >90)
GFR calc non Af Amer: >90 mL/min (ref >90)
Glucose, Bld: 95 mg/dL (ref 70–99)
Potassium: 3.7 mmol/L (ref 3.5–5.1)
Sodium: 141 mmol/L (ref 135–145)

## 2014-04-27 LAB — CBC
HCT: 29.1 % — ABNORMAL LOW (ref 36.0–46.0)
Hemoglobin: 9.7 g/dL — ABNORMAL LOW (ref 12.0–15.0)
MCH: 29.2 pg (ref 26.0–34.0)
MCHC: 33.3 g/dL (ref 30.0–36.0)
MCV: 87.7 fL (ref 78.0–100.0)
PLATELETS: 212 10*3/uL (ref 150–400)
RBC: 3.32 MIL/uL — ABNORMAL LOW (ref 3.87–5.11)
RDW: 16.9 % — ABNORMAL HIGH (ref 11.5–15.5)
WBC: 5.8 10*3/uL (ref 4.0–10.5)

## 2014-04-27 LAB — WOUND CULTURE

## 2014-04-27 LAB — GLUCOSE, CAPILLARY: Glucose-Capillary: 106 mg/dL — ABNORMAL HIGH (ref 70–99)

## 2014-04-27 MED ORDER — HYDRALAZINE HCL 20 MG/ML IJ SOLN
INTRAMUSCULAR | Status: AC
  Administered 2014-04-27
  Filled 2014-04-27: qty 1

## 2014-04-27 NOTE — Discharge Summary (Signed)
Physician Discharge Summary  Angel Price WNU:[id number] DOB: 1965-03-25 DOA: 04/24/2014  PCP: Doran Heater, MD  Admit date: 04/24/2014 Discharge date: 04/27/2014  Time spent: 40 minutes  Recommendations for Outpatient Follow-up:  1. Reportedly has appointment with PCP 05/09/14. Recommend cbc to track Hg. Referral to general surgery for internal hemorrhoid. Refer for OP chest CT to follow lung nodule 1-2 months. Referral for ETOH cessation program (detox) 2. Wound follow up at [facility] 04/29/14  Discharge Diagnoses:  Principal Problem:   Intractable nausea and vomiting Active Problems:   History of schizoaffective disorder   Tobacco abuse   Alcohol abuse   History of gastric bypass   Hypothyroid   Lupus   Rectal bleeding   Gastroparesis   Acute blood loss anemia   Hypokalemia   Hyponatremia   Hematemesis with nausea   Lung nodule < 6cm on CT   Hemorrhoids   Diverticulosis   Gastritis   Hiatal hernia   Schatzki's ring   Lung nodule   Discharge Condition: stable  Diet recommendation: soft  Filed Weights   04/24/14 1619  Weight: 59.421 kg (131 lb)    History of present illness:  This is a 49 year old lady, who is well-known to the hospital and to the gastroenterologist, who has a history of gastroparesis and alcoholism presented to ED on 04/24/14 with intractable nausea and vomiting which she has had for the  3-4 days. She also described possibly some coffee-ground emesis and some rectal bleeding. She had been recommended to go to a pain clinic in the outpatient setting. She denied any fever, chest pain, palpitations or limb weakness. She continued to drink alcohol, 3-4 beers every day. She also had a psychiatric history with possible bipolar disorder, personality disorder. Since she had intractable nausea and vomiting, she was admitted for further management. Hospital Course:   Intractable nausea and vomiting. Chronic secondary to gastroparesis in setting of continued  ETOH abuse. s/p EGG revealing non-erosive gastritis. GI recommends  protonix and reglan.toplerating soft diet at time of discharge. Continue home zofran.    Internal hemorrhoids per colonoscopy. No further bloody stools. Hg 9.. Continue PPI per GI recommendations. GI also recommend considering hemorrhoid banding or surgery and patient indicates prefers surgery. OP referral to general surgery. Recommend CBC 1 week to track HG.   Acute blood loss anemia: in setting of chronic anemia related to chronic disease and ETOH abuse and acute bleeding likely from internal hemorrhoids. S/p colonoscopy revealing large internal hemorrhoids. Hg 9.8. Tachycardia resolved. Recommend CBC 1 week to track Hg.    Gastroparesis. Treatment as above for #1.  Hematemesis: no further episodes. EGD  with no source identified for hematemesis. Results reveal small hiatal hernia, non-erosive gastritis and Schatzki's ring (patent). Continue PPI BID. Soft diet.    Hypokalemia: related to #1. Resolved.r   Alcohol abuse: no s/sx withdrawal. SW consult for referral to OP resources for detox  Bipolar disorder. Stable at baseline  Abdominal pain: chronic likely related to above. Remained at baseline.   HTN: controlled.    Lung nodule: CT chest 02/2014 revealed right upper and middle lobe lung nodule. Will need follow up CT in 1-2 months.   Procedures:  EGD 2/16  Colonoscopy 2/16   Consultations:  GI  Discharge Exam: Filed Vitals:   04/27/14 0828  BP: 119/89  Pulse:   Temp:   Resp:     General: well nourished appears comfortable Cardiovascular: RRR No m/g/r no LE edema Respiratory: normal effort BS clear bilaterally  no crackles no wheeze  Discharge Instructions    Current Discharge Medication List    CONTINUE these medications which have NOT CHANGED   Details  ARIPiprazole (ABILIFY) 5 MG tablet Take 1 tablet (5 mg total) by mouth 2 (two) times daily. Qty: 60 tablet, Refills: 2     escitalopram (LEXAPRO) 20 MG tablet Take 1 tablet (20 mg total) by mouth daily. Qty: 30 tablet, Refills: 2    fluticasone (FLONASE) 50 MCG/ACT nasal spray Place 1 spray into both nostrils daily.    lisinopril (PRINIVIL,ZESTRIL) 10 MG tablet Take 10 mg by mouth daily.    metoCLOPramide (REGLAN) 10 MG tablet Take 1 tablet (10 mg total) by mouth 4 (four) times daily -  before meals and at bedtime. Qty: 90 tablet, Refills: 1    ondansetron (ZOFRAN) 4 MG tablet Take 1 tablet (4 mg total) by mouth 3 (three) times daily. With meals Qty: 90 tablet, Refills: 1    pantoprazole (PROTONIX) 40 MG tablet Take 1 tablet (40 mg total) by mouth 2 (two) times daily before a meal. Qty: 60 tablet, Refills: 3    sucralfate (CARAFATE) 1 G tablet Take 1 g by mouth 4 (four) times daily.    HYDROcodone-acetaminophen (NORCO/VICODIN) 5-325 MG per tablet Take 1 tablet by mouth every 6 (six) hours as needed for severe pain. Qty: 12 tablet, Refills: 0    mupirocin ointment (BACTROBAN) 2 % Place into the nose 2 (two) times daily. Qty: 22 g, Refills: 0      STOP taking these medications     promethazine (PHENERGAN) 25 MG tablet        Allergies  Allergen Reactions  . Codeine Hives  . Morphine And Related Hives    gastritis   Follow-up Information    Follow up with Doran Heater, MD. Schedule an appointment as soon as possible for a visit on 05/09/2014.   Specialty:  Family Medicine   Why:  patient reports appointment       Follow up with Jamesetta So, MD. Call in 2 weeks.   Specialty:  General Surgery   Why:  regarding evaluation of internal hemorrhoids    Contact information:   [id number] [place] [address] [phone]        The results of significant diagnostics from this hospitalization (including imaging, microbiology, ancillary and laboratory) are listed below for reference.    Significant Diagnostic Studies: Dg Chest 2 View  04/26/2014   CLINICAL DATA:  Cough,  congestion, and left-sided chest pain.  EXAM: CHEST  2 VIEW  COMPARISON:  02/22/2015  FINDINGS: Mild atelectasis at the left base, new from prior. Indistinct opacity in the right mid lung is scarring based on chest CT from 03/02/2014. There is no edema, consolidation, effusion, or pneumothorax. Normal heart size and aortic contours. Extensive remote intra-abdominal surgery, including cholecystectomy and right adrenalectomy.  IMPRESSION: Minimal atelectasis at the left base.  No edema or pneumonia.   Electronically Signed   By: Monte Fantasia M.D.   On: 04/26/2014 19:44   Dg Abd Acute W/chest  04/24/2014   CLINICAL DATA:  Mid abdominal pain, vomiting and diarrhea over the past 5 days. Multiple prior abdominal surgeries. Current history of hypertension, lupus.  EXAM: ACUTE ABDOMEN SERIES (ABDOMEN 2 VIEW & CHEST 1 VIEW)  COMPARISON:  Two-view chest x-ray 03/14/2014. Acute abdomen series 02/02/2014. CT chest abdomen and pelvis 03/02/2014. Multiple prior CT abdomen and pelvis examinations prior to that.  FINDINGS: Numerous surgical clips in the  right upper quadrant abdomen and anastomotic suture material in the upper and mid abdomen. Bowel gas pattern normal without evidence of obstruction or significant ileus. Expected stool burden in the colon. No evidence of free intraperitoneal air or significant air-fluid levels on the erect image. No visible opaque urinary tract calculi. Phleboliths in the pelvis. Injection granulomata in the right buttock. Regional skeleton unremarkable.  Cardiomediastinal silhouette unremarkable, unchanged. Lungs clear. Bronchovascular markings normal. Pulmonary vascularity normal. No visible pleural effusions. No pneumothorax.  IMPRESSION: 1. No acute abdominal abnormality. 2.  No acute cardiopulmonary disease.   Electronically Signed   By: Evangeline Dakin M.D.   On: 04/24/2014 18:03   Dg Duanne Limerick  W/kub  04/05/2014   CLINICAL DATA:  Postprandial vomiting, history of Billroth II  gastrojejunostomy, question anastomotic stricture  EXAM: UPPER GI SERIES WITH KUB  TECHNIQUE: After obtaining a scout radiograph a routine upper GI series was performed using thin barium  FLUOROSCOPY TIME:  1 min 36 seconds  COMPARISON:  CT abdomen and pelvis 03/02/2014  FINDINGS: Normal esophageal distention and motility.  No esophageal mass or stricture.  No gastroesophageal reflux or hiatal hernia seen during exam.  Post antrectomy with gastrojejunostomy.  Stomach distends normally without gross mass or ulceration.  Patent gastrojejunostomy anastomosis with free spillage of contrast from the stomach into normal-appearing proximal jejunal loops.  Emptying is clearly facilitated by upright positioning rather than supine.  Visualize jejunal loops normal in appearance with normal diameter and mucosal fold pattern.  No persistent intraluminal filling defects.  No anastomotic stricture or ulcer identified.  Normal bowel gas pattern on scout image.  Osseous demineralization.  IMPRESSION: Post Billroth II antrectomy and gastrojejunostomy.  Widely patent gastrojejunostomy anastomosis without anastomotic stricture or ulcer.  No acute abnormalities identified.   Electronically Signed   By: Lavonia Dana M.D.   On: 04/05/2014 14:11    Microbiology: Recent Results (from the past 240 hour(s))  Wound culture     Status: None   Collection Time: 04/25/14  7:14 AM  Result Value Ref Range Status   Specimen Description WOUND ABDOMEN  Final   Special Requests NONE  Final   Gram Stain   Final    MODERATE WBC PRESENT,BOTH PMN AND MONONUCLEAR NO SQUAMOUS EPITHELIAL CELLS SEEN NO ORGANISMS SEEN Performed at [facility]    Culture   Final    FEW DIPHTHEROIDS(CORYNEBACTERIUM SPECIES) Note: Standardized susceptibility testing for this organism is not available. Performed at [facility]    Report Status 04/27/2014 FINAL  Final  MRSA PCR Screening     Status: None   Collection Time: 04/25/14  9:20 AM   Result Value Ref Range Status   MRSA by PCR NEGATIVE NEGATIVE Final    Comment:        The GeneXpert MRSA Assay (FDA approved for NASAL specimens only), is one component of a comprehensive MRSA colonization surveillance program. It is not intended to diagnose MRSA infection nor to guide or monitor treatment for MRSA infections.   Surgical pcr screen     Status: None   Collection Time: 04/25/14  9:56 PM  Result Value Ref Range Status   MRSA, PCR NEGATIVE NEGATIVE Final   Staphylococcus aureus NEGATIVE NEGATIVE Final    Comment:        The Xpert SA Assay (FDA approved for NASAL specimens in patients over 49 years of age), is one component of a comprehensive surveillance program.  Test performance has been validated by [facility] for patients greater  than or equal to 63 year old. It is not intended to diagnose infection nor to guide or monitor treatment.      Labs: Basic Metabolic Panel:  Recent Labs Lab 04/24/14 1650 04/25/14 0629 04/26/14 0619 04/27/14 0535  NA 134* 132* 138 141  K 3.7 3.1* 3.9 3.7  CL 99 102 105 104  CO2 19 23 27 28   GLUCOSE 70 84 102* 95  BUN 5* <5* <5* <5*  CREATININE 0.56 0.62 0.54 0.55  CALCIUM 8.9 8.2* 8.8 9.3  MG  --   --  1.5  --    Liver Function Tests:  Recent Labs Lab 04/24/14 1650 04/25/14 0629  AST 82* 48*  ALT 55* 37*  ALKPHOS 84 68  BILITOT 1.1 1.4*  PROT 7.9 6.3  ALBUMIN 4.2 3.4*   No results for input(s): LIPASE, AMYLASE in the last 168 hours. No results for input(s): AMMONIA in the last 168 hours. CBC:  Recent Labs Lab 04/24/14 1650 04/25/14 0629 04/25/14 1527 04/26/14 0619 04/27/14 0535  WBC 7.0 4.9 6.0 5.6 5.8  NEUTROABS 4.7  --   --   --   --   HGB 12.6 10.2* 10.1* 9.8* 9.7*  HCT 35.9* 29.6* 29.2* 29.1* 29.1*  MCV 85.3 85.8 86.4 87.4 87.7  PLT 292 252 225 221 212   Cardiac Enzymes: No results for input(s): CKTOTAL, CKMB, CKMBINDEX, TROPONINI in the last 168 hours. BNP: BNP (last 3  results)  Recent Labs  03/14/14 1054  BNP 13.0    ProBNP (last 3 results) No results for input(s): PROBNP in the last 8760 hours.  CBG: No results for input(s): GLUCAP in the last 168 hours.     SignedRadene Gunning  [facility] 04/27/2014, 1:13 PM

## 2014-04-27 NOTE — Progress Notes (Signed)
Patient being d/c home with instructions. IV cath removed and intact. No pain/swelling at site. NO c/o pain at this time.

## 2014-04-27 NOTE — Progress Notes (Addendum)
Subjective:  Patient reports some vomiting overnight. None this morning. Has not tried to eat yet. Some nausea. Interested in surgery for hemorrhoids rather than banding. Not sure she can go home today. Wants to try and stop drinking etoh. Wants medication to help her. Doesn't want inpatient rehab.   Concerned about chronic drainage from abdominal wound. One spot tender but not draining yet.   +cough. No SOB.  Objective: Vital signs in last 24 hours: Temp:  [97.9 F (36.6 C)-98.4 F (36.9 C)] 98.2 F (36.8 C) (02/10 0605) Pulse Rate:  [76-118] 80 (02/10 0605) Resp:  [12-26] 18 (02/10 0605) BP: (105-151)/(76-109) 118/85 mmHg (02/10 0605) SpO2:  [92 %-100 %] 100 % (02/10 0605) Last BM Date: 04/26/14 General:   Alert,  Well-developed, well-nourished, pleasant and cooperative in NAD Head:  Normocephalic and atraumatic. Eyes:  Sclera clear, no icterus.  Abdomen:  Soft, nondistended. Yellow drainage from opening just below umbilicus. 2cm indurated, tender area without drainage left of umbilicus. Normal bowel sounds, without guarding, and without rebound.   Extremities:  Without clubbing, deformity or edema. Neurologic:  Alert and  oriented x4;  grossly normal neurologically. Skin:  Intact without significant lesions or rashes. Psych:  Alert and cooperative. Normal mood and affect.  Intake/Output from previous day: 02/09 0701 - 02/10 0700 In: 2529.3 [P.O.:240; I.V.:2289.3] Out: 713 [Urine:713] Intake/Output this shift:    Lab Results: CBC  Recent Labs  04/25/14 1527 04/26/14 0619 04/27/14 0535  WBC 6.0 5.6 5.8  HGB 10.1* 9.8* 9.7*  HCT 29.2* 29.1* 29.1*  MCV 86.4 87.4 87.7  PLT 225 221 212   BMET  Recent Labs  04/25/14 0629 04/26/14 0619 04/27/14 0535  NA 132* 138 141  K 3.1* 3.9 3.7  CL 102 105 104  CO2 23 27 28   GLUCOSE 84 102* 95  BUN <5* <5* <5*  CREATININE 0.62 0.54 0.55  CALCIUM 8.2* 8.8 9.3   LFTs  Recent Labs  04/24/14 1650 04/25/14 0629  BILITOT  1.1 1.4*  ALKPHOS 84 68  AST 82* 48*  ALT 55* 37*  PROT 7.9 6.3  ALBUMIN 4.2 3.4*   No results for input(s): LIPASE in the last 72 hours. PT/INR  Recent Labs  04/24/14 1650  LABPROT 13.2  INR 0.99      Imaging Studies: Dg Chest 2 View  04/26/2014   CLINICAL DATA:  Cough, congestion, and left-sided chest pain.  EXAM: CHEST  2 VIEW  COMPARISON:  02/22/2015  FINDINGS: Mild atelectasis at the left base, new from prior. Indistinct opacity in the right mid lung is scarring based on chest CT from 03/02/2014. There is no edema, consolidation, effusion, or pneumothorax. Normal heart size and aortic contours. Extensive remote intra-abdominal surgery, including cholecystectomy and right adrenalectomy.  IMPRESSION: Minimal atelectasis at the left base.  No edema or pneumonia.   Electronically Signed   By: Monte Fantasia M.D.   On: 04/26/2014 19:44   Dg Abd Acute W/chest  04/24/2014   CLINICAL DATA:  Mid abdominal pain, vomiting and diarrhea over the past 5 days. Multiple prior abdominal surgeries. Current history of hypertension, lupus.  EXAM: ACUTE ABDOMEN SERIES (ABDOMEN 2 VIEW & CHEST 1 VIEW)  COMPARISON:  Two-view chest x-ray 03/14/2014. Acute abdomen series 02/02/2014. CT chest abdomen and pelvis 03/02/2014. Multiple prior CT abdomen and pelvis examinations prior to that.  FINDINGS: Numerous surgical clips in the right upper quadrant abdomen and anastomotic suture material in the upper and mid abdomen. Bowel gas pattern normal without evidence  of obstruction or significant ileus. Expected stool burden in the colon. No evidence of free intraperitoneal air or significant air-fluid levels on the erect image. No visible opaque urinary tract calculi. Phleboliths in the pelvis. Injection granulomata in the right buttock. Regional skeleton unremarkable.  Cardiomediastinal silhouette unremarkable, unchanged. Lungs clear. Bronchovascular markings normal. Pulmonary vascularity normal. No visible pleural  effusions. No pneumothorax.  IMPRESSION: 1. No acute abdominal abnormality. 2.  No acute cardiopulmonary disease.   Electronically Signed   By: Evangeline Dakin M.D.   On: 04/24/2014 18:03   Dg Duanne Limerick  W/kub  04/05/2014   CLINICAL DATA:  Postprandial vomiting, history of Billroth II gastrojejunostomy, question anastomotic stricture  EXAM: UPPER GI SERIES WITH KUB  TECHNIQUE: After obtaining a scout radiograph a routine upper GI series was performed using thin barium  FLUOROSCOPY TIME:  1 min 36 seconds  COMPARISON:  CT abdomen and pelvis 03/02/2014  FINDINGS: Normal esophageal distention and motility.  No esophageal mass or stricture.  No gastroesophageal reflux or hiatal hernia seen during exam.  Post antrectomy with gastrojejunostomy.  Stomach distends normally without gross mass or ulceration.  Patent gastrojejunostomy anastomosis with free spillage of contrast from the stomach into normal-appearing proximal jejunal loops.  Emptying is clearly facilitated by upright positioning rather than supine.  Visualize jejunal loops normal in appearance with normal diameter and mucosal fold pattern.  No persistent intraluminal filling defects.  No anastomotic stricture or ulcer identified.  Normal bowel gas pattern on scout image.  Osseous demineralization.  IMPRESSION: Post Billroth II antrectomy and gastrojejunostomy.  Widely patent gastrojejunostomy anastomosis without anastomotic stricture or ulcer.  No acute abnormalities identified.   Electronically Signed   By: Lavonia Dana M.D.   On: 04/05/2014 14:11  [2 weeks]   Assessment: 49 year old female admitted with acute on chronic nausea and vomiting in the setting of gastroparesis, hematemesis, and rectal bleeding with 2 gram drop in Hgb noted since admission, elevated ast/alt. H/o complicated by Billroth II hemigastrectomy, multiple abdominal surgeries for obstruction.   EGD showed mild non-erosive gastritis, normal but friable anastomosis, patent Schatzki's ring.  6cm in lenth gastric remnant. Colonoscopy showed moderate pan-colonic diverticulosis, one colon polyp, rectal bleeding due to large internal hemorrhoids.   As of note, acute on chronic abdominal pain noted; patient has had multiple CT imaging. Hold off on any additional imaging at this time unless worsening clinical status. No peritoneal signs noted.   Elevated transaminases in the setting of chronic ETOH abuse. Trending downward, likely due to mild etoh hepatitis. Previous viral markers negative.   Complains of ongoing nausea and some vomiting overnight. Tells me she didn't like the beans so she requested french fries. Discussed gastroparesis diet at length with patient today.   She is complaining of sore indurated lesion left of umbilicus and ongoing drainage from below umbilicus. H/o wound debridement 2014 by Dr. Arnoldo Morale and patient complains of ongoing drainage despite surgery. Has been evaluated at [facility] for this. CT there 12/2013 (There is increased soft tissue attenuation deep to the umbilicus and within the periumbilical region with a possible small sinus tract extending from the ventral abdominal wall to the inferior aspect of the umbilicus and lateral the umbilicus. Small bowel loops are closely apposed to the anterior abdominal wall this region. Evaluation for an enterocutaneous fistula is limited without the use of oral contrast.). She has f/u at [facility] next week for this.    Plan: 1. F/u biopsies. 2. Consider hemorrhoid banding or surgery. Patient would like surgery  with Dr. Arnoldo Morale. Would recommend outpatient consultation for this. 3. Continue Reglan qac/qhs, scheduled zofran for now, PPI BID. Educated patient regarding making better dietary choices, gastroparesis diet.  No evidence of obstruction on AAS on admission.  4. Etoh cessation discussed with patient.  5. Keep appointment for 04/29/14 at [facility] for wound care.    Laureen Ochs. Bernarda Caffey [facility] [phone] 2/10/20169:38 AM     LOS: 3 days     Attending note:  Patient seen and examined this afternoon. Tolerated her lunch well. Agree with discharge today. Discussed with Dr. Roderic Palau.  Outpatient follow-ups as outlined above.

## 2014-04-27 NOTE — Care Management Note (Signed)
    Page 1 of 1   04/27/2014     4:48:39 PM CARE MANAGEMENT NOTE 04/27/2014  Patient:  Angel Price, Angel Price   Account Number:  [id number]  Date Initiated:  04/27/2014  Documentation initiated by:  Vladimir Creeks  Subjective/Objective Assessment:   Admitted with GIB. Pt is independent at home and will return home at D/C. No needs identified     Action/Plan:   Anticipated DC Date:  04/27/2014   Anticipated DC Plan:  [place]  CM consult      Choice offered to / List presented to:             Status of service:  Completed, signed off Medicare Important Message given?  YES (If response is "NO", the following Medicare IM given date fields will be blank) Date Medicare IM given:  04/27/2014 Medicare IM given by:  Vladimir Creeks Date Additional Medicare IM given:   Additional Medicare IM given by:    Discharge Disposition:  HOME/SELF CARE  Per UR Regulation:  Reviewed for med. necessity/level of care/duration of stay  If discussed at [place] of Stay Meetings, dates discussed:    Comments:  04/27/14 [place] RN/CM

## 2014-04-28 NOTE — Care Management Utilization Note (Signed)
UR completed 

## 2014-05-09 ENCOUNTER — Telehealth: Payer: Self-pay | Admitting: Gastroenterology

## 2014-05-09 ENCOUNTER — Ambulatory Visit: Payer: Medicare Other | Admitting: Gastroenterology

## 2014-05-09 ENCOUNTER — Encounter: Payer: Self-pay | Admitting: Gastroenterology

## 2014-05-09 NOTE — Telephone Encounter (Signed)
Mailed letter °

## 2014-05-09 NOTE — Telephone Encounter (Signed)
Pt was a no show

## 2014-05-11 DIAGNOSIS — E039 Hypothyroidism, unspecified: Secondary | ICD-10-CM | POA: Diagnosis not present

## 2014-05-11 DIAGNOSIS — K210 Gastro-esophageal reflux disease with esophagitis: Secondary | ICD-10-CM | POA: Diagnosis not present

## 2014-05-11 DIAGNOSIS — D649 Anemia, unspecified: Secondary | ICD-10-CM | POA: Diagnosis not present

## 2014-05-11 DIAGNOSIS — I10 Essential (primary) hypertension: Secondary | ICD-10-CM | POA: Diagnosis not present

## 2014-05-17 ENCOUNTER — Telehealth: Payer: Self-pay | Admitting: Internal Medicine

## 2014-05-17 MED ORDER — HYDROCORTISONE 2.5 % RE CREA: 1.0000 "application " | TOPICAL_CREAM | Freq: Two times a day (BID) | RECTAL | Status: DC

## 2014-05-17 NOTE — Telephone Encounter (Signed)
I sent in Anusol cream to take BID X 7 days

## 2014-05-17 NOTE — Telephone Encounter (Signed)
PLEASE CALL PATIENT REGARDING HER HEMORRHOIDS, HAVING SOME BLEEDING AND HAS A CONSULT ALREADY SCHEDULED WITH DR Arnoldo Morale IN NEAR FUTURE, WANTS TO KNOW IF SHE SHOULD GO TO THE ER, COME HERE OR WAIT FOR HER CONSULT.  PLEASE ADVISE

## 2014-05-17 NOTE — Telephone Encounter (Signed)
Pt is aware.  

## 2014-05-17 NOTE — Telephone Encounter (Signed)
Spoke with the pt- she is having brbpr x 3 days that comes and goes. She is seeing the blood in the water. She has burning and some pain. Her appt with Dr.Jenkins is on 05/26/14. She wants to know if we can send in something to [facility] that could help until her appt with Dr.Jenkins?

## 2014-05-19 ENCOUNTER — Encounter: Payer: Self-pay | Admitting: Internal Medicine

## 2014-05-22 ENCOUNTER — Emergency Department (HOSPITAL_COMMUNITY): Payer: Medicare Other

## 2014-05-22 ENCOUNTER — Emergency Department (HOSPITAL_COMMUNITY)
Admission: EM | Admit: 2014-05-22 | Discharge: 2014-05-22 | Disposition: A | Discharge status: Home / Self Care | Payer: Medicare Other | Attending: Emergency Medicine | Admitting: Emergency Medicine

## 2014-05-22 ENCOUNTER — Encounter (HOSPITAL_COMMUNITY): Payer: Self-pay

## 2014-05-22 DIAGNOSIS — R112 Nausea with vomiting, unspecified: Secondary | ICD-10-CM | POA: Diagnosis not present

## 2014-05-22 DIAGNOSIS — F329 Major depressive disorder, single episode, unspecified: Secondary | ICD-10-CM | POA: Diagnosis not present

## 2014-05-22 DIAGNOSIS — Z9049 Acquired absence of other specified parts of digestive tract: Secondary | ICD-10-CM | POA: Insufficient documentation

## 2014-05-22 DIAGNOSIS — Z86010 Personal history of colonic polyps: Secondary | ICD-10-CM | POA: Insufficient documentation

## 2014-05-22 DIAGNOSIS — G8929 Other chronic pain: Secondary | ICD-10-CM | POA: Diagnosis not present

## 2014-05-22 DIAGNOSIS — Z8739 Personal history of other diseases of the musculoskeletal system and connective tissue: Secondary | ICD-10-CM | POA: Insufficient documentation

## 2014-05-22 DIAGNOSIS — Q394 Esophageal web: Secondary | ICD-10-CM | POA: Insufficient documentation

## 2014-05-22 DIAGNOSIS — Z90710 Acquired absence of both cervix and uterus: Secondary | ICD-10-CM | POA: Diagnosis not present

## 2014-05-22 DIAGNOSIS — Z79899 Other long term (current) drug therapy: Secondary | ICD-10-CM | POA: Diagnosis not present

## 2014-05-22 DIAGNOSIS — K625 Hemorrhage of anus and rectum: Secondary | ICD-10-CM | POA: Diagnosis not present

## 2014-05-22 DIAGNOSIS — F419 Anxiety disorder, unspecified: Secondary | ICD-10-CM | POA: Diagnosis not present

## 2014-05-22 DIAGNOSIS — R109 Unspecified abdominal pain: Secondary | ICD-10-CM | POA: Insufficient documentation

## 2014-05-22 DIAGNOSIS — I10 Essential (primary) hypertension: Secondary | ICD-10-CM | POA: Diagnosis not present

## 2014-05-22 DIAGNOSIS — Z862 Personal history of diseases of the blood and blood-forming organs and certain disorders involving the immune mechanism: Secondary | ICD-10-CM | POA: Diagnosis not present

## 2014-05-22 DIAGNOSIS — Z7952 Long term (current) use of systemic steroids: Secondary | ICD-10-CM | POA: Insufficient documentation

## 2014-05-22 DIAGNOSIS — K219 Gastro-esophageal reflux disease without esophagitis: Secondary | ICD-10-CM | POA: Diagnosis not present

## 2014-05-22 DIAGNOSIS — D3500 Benign neoplasm of unspecified adrenal gland: Secondary | ICD-10-CM | POA: Diagnosis not present

## 2014-05-22 DIAGNOSIS — Z792 Long term (current) use of antibiotics: Secondary | ICD-10-CM | POA: Insufficient documentation

## 2014-05-22 DIAGNOSIS — E876 Hypokalemia: Secondary | ICD-10-CM | POA: Insufficient documentation

## 2014-05-22 DIAGNOSIS — K760 Fatty (change of) liver, not elsewhere classified: Secondary | ICD-10-CM | POA: Diagnosis not present

## 2014-05-22 DIAGNOSIS — R42 Dizziness and giddiness: Secondary | ICD-10-CM | POA: Diagnosis not present

## 2014-05-22 LAB — I-STAT CG4 LACTIC ACID, ED
Lactic Acid, Venous: 3.49 mmol/L (ref 0.5–2.0)
Lactic Acid, Venous: 3.31 mmol/L (ref 0.5–2.0)
Lactic Acid, Venous: 0.77 mmol/L (ref 0.5–2.0)

## 2014-05-22 LAB — CBC WITH DIFFERENTIAL/PLATELET
Basophils Absolute: 0 10*3/uL (ref 0.0–0.1)
Basophils Relative: 0 % (ref 0–1)
EOS PCT: 2 % (ref 0–5)
Eosinophils Absolute: 0.1 10*3/uL (ref 0.0–0.7)
HCT: 32.9 % — ABNORMAL LOW (ref 36.0–46.0)
Hemoglobin: 11.3 g/dL — ABNORMAL LOW (ref 12.0–15.0)
LYMPHS ABS: 2.5 10*3/uL (ref 0.7–4.0)
Lymphocytes Relative: 54 % — ABNORMAL HIGH (ref 12–46)
MCH: 30.1 pg (ref 26.0–34.0)
MCHC: 34.3 g/dL (ref 30.0–36.0)
MCV: 87.5 fL (ref 78.0–100.0)
Monocytes Absolute: 0.3 10*3/uL (ref 0.1–1.0)
Monocytes Relative: 7 % (ref 3–12)
NEUTROS PCT: 37 % — AB (ref 43–77)
Neutro Abs: 1.7 10*3/uL (ref 1.7–7.7)
Platelets: 202 10*3/uL (ref 150–400)
RBC: 3.76 MIL/uL — ABNORMAL LOW (ref 3.87–5.11)
RDW: 18.4 % — AB (ref 11.5–15.5)
WBC: 4.6 10*3/uL (ref 4.0–10.5)

## 2014-05-22 LAB — COMPREHENSIVE METABOLIC PANEL
ALT: 32 U/L (ref 0–35)
ANION GAP: 11 (ref 5–15)
AST: 60 U/L — ABNORMAL HIGH (ref 0–37)
Albumin: 3.8 g/dL (ref 3.5–5.2)
Alkaline Phosphatase: 80 U/L (ref 39–117)
BILIRUBIN TOTAL: 0.5 mg/dL (ref 0.3–1.2)
BUN: 8 mg/dL (ref 6–23)
CHLORIDE: 108 mmol/L (ref 96–112)
CO2: 23 mmol/L (ref 19–32)
Calcium: 8.7 mg/dL (ref 8.4–10.5)
Creatinine, Ser: 0.53 mg/dL (ref 0.50–1.10)
GFR calc Af Amer: >90 mL/min (ref >90)
Glucose, Bld: 117 mg/dL — ABNORMAL HIGH (ref 70–99)
Potassium: 2.8 mmol/L — ABNORMAL LOW (ref 3.5–5.1)
Sodium: 142 mmol/L (ref 135–145)
Total Protein: 7 g/dL (ref 6.0–8.3)

## 2014-05-22 LAB — URINALYSIS, ROUTINE W REFLEX MICROSCOPIC
Bilirubin Urine: NEGATIVE
Glucose, UA: NEGATIVE mg/dL
Hgb urine dipstick: NEGATIVE
Leukocytes, UA: NEGATIVE
NITRITE: NEGATIVE
PH: 5.5 (ref 5.0–8.0)
PROTEIN: NEGATIVE mg/dL
Specific Gravity, Urine: 1.025 (ref 1.005–1.030)
Urobilinogen, UA: 0.2 mg/dL (ref 0.0–1.0)

## 2014-05-22 LAB — LIPASE, BLOOD: Lipase: 18 U/L (ref 11–59)

## 2014-05-22 LAB — POC OCCULT BLOOD, ED: Fecal Occult Bld: POSITIVE — AB

## 2014-05-22 MED ORDER — HYDROMORPHONE HCL 1 MG/ML IJ SOLN
1.0000 mg | Freq: Once | INTRAMUSCULAR | Status: AC
  Administered 2014-05-22: 1 mg via INTRAVENOUS
  Filled 2014-05-22: qty 1

## 2014-05-22 MED ORDER — PROMETHAZINE HCL 25 MG/ML IJ SOLN
25.0000 mg | Freq: Once | INTRAMUSCULAR | Status: AC
Start: 2014-05-22 — End: 2014-05-22
  Administered 2014-05-22: 25 mg via INTRAVENOUS
  Filled 2014-05-22: qty 1

## 2014-05-22 MED ORDER — SODIUM CHLORIDE 0.9 % IJ SOLN
INTRAMUSCULAR | Status: AC
  Filled 2014-05-22: qty 30

## 2014-05-22 MED ORDER — POTASSIUM CHLORIDE ER 10 MEQ PO TBCR: 10.0000 meq | EXTENDED_RELEASE_TABLET | Freq: Two times a day (BID) | ORAL | Status: DC

## 2014-05-22 MED ORDER — POTASSIUM CHLORIDE 10 MEQ/100ML IV SOLN
10.0000 meq | INTRAVENOUS | Status: AC
  Administered 2014-05-22 (×2): 10 meq via INTRAVENOUS
  Filled 2014-05-22 (×2): qty 100

## 2014-05-22 MED ORDER — ONDANSETRON HCL 4 MG/2ML IJ SOLN
4.0000 mg | Freq: Once | INTRAMUSCULAR | Status: AC
  Administered 2014-05-22: 4 mg via INTRAVENOUS
  Filled 2014-05-22: qty 2

## 2014-05-22 MED ORDER — SODIUM CHLORIDE 0.9 % IV BOLUS (SEPSIS)
1000.0000 mL | Freq: Once | INTRAVENOUS | Status: AC
  Administered 2014-05-22: 1000 mL via INTRAVENOUS

## 2014-05-22 MED ORDER — IOHEXOL 300 MG/ML  SOLN
100.0000 mL | Freq: Once | INTRAMUSCULAR | Status: AC | PRN
  Administered 2014-05-22: 100 mL via INTRAVENOUS

## 2014-05-22 MED ORDER — PROMETHAZINE HCL 25 MG PO TABS: 25.0000 mg | ORAL_TABLET | Freq: Four times a day (QID) | ORAL | Status: DC | PRN

## 2014-05-22 MED ORDER — SODIUM CHLORIDE 0.9 % IJ SOLN
INTRAMUSCULAR | Status: AC
  Filled 2014-05-22: qty 500

## 2014-05-22 MED ORDER — SODIUM CHLORIDE 0.9 % IV BOLUS (SEPSIS): 1000.0000 mL | Freq: Once | INTRAVENOUS | Status: DC

## 2014-05-22 MED ORDER — ONDANSETRON HCL 4 MG/2ML IJ SOLN
4.0000 mg | Freq: Once | INTRAMUSCULAR | Status: AC
  Administered 2014-05-22: 4 mg via INTRAMUSCULAR
  Filled 2014-05-22: qty 2

## 2014-05-22 MED ORDER — POTASSIUM CHLORIDE CRYS ER 20 MEQ PO TBCR
40.0000 meq | EXTENDED_RELEASE_TABLET | Freq: Once | ORAL | Status: AC
  Administered 2014-05-22: 40 meq via ORAL
  Filled 2014-05-22: qty 2

## 2014-05-22 MED ORDER — SODIUM CHLORIDE 0.9 % IV BOLUS (SEPSIS)
1000.0000 mL | Freq: Once | INTRAVENOUS | Status: DC
Start: 2014-05-22 — End: 2014-05-22

## 2014-05-22 NOTE — ED Notes (Signed)
MD at bedside. 

## 2014-05-22 NOTE — ED Notes (Signed)
POC Lactic Acid 3.49

## 2014-05-22 NOTE — ED Notes (Signed)
Oral contrast completed.

## 2014-05-22 NOTE — Discharge Instructions (Signed)

## 2014-05-22 NOTE — ED Notes (Signed)
Pt reports she has had some rectal bleeding since last week, saw her doctor for same and was given some cream for external use.  Pt states there has been no improvement.  Pt also c/o vomiting, headache, "hurting all over"and abd pain.

## 2014-05-22 NOTE — ED Provider Notes (Signed)
8:00 AM  Assumed care from Dr. Dina Rich.  Pt is a 49 y.o. F with history of chronic abdominal pain and a chronic nonhealing abdominal wound who presents emergency department with reports of her chronic abdominal pain and vomiting as well as rectal bleeding. Has a history of hemorrhoids. No gross blood on exam but is guaiac positive. Hemodynamically stable. Abdominal wound is small without surrounding erythema or warmth with small amount of thin yellow drainage. Patient is diffusely tender all over her body wherever you touch her. Her labs show potassium of 2.8. Will replace. Otherwise no leukocytosis. Hemoglobin 11.3. Lactate however is elevated which may be secondary to dehydration from vomiting. We will hydrate patient and recheck lactate. Acute abdominal series unremarkable. Patient has had multiple recent CT scans without acute abnormality.   9:20 AM  Pt's lactate remains persistently elevated despite 2 L of IV fluid. Will give another 2 L of IV fluid and obtain a CT scan to evaluate for intra-abdominal infection. Patient continuing to complain of pain and nausea but has not had any vomiting in the emergency department. She is tolerating by mouth.   11:30 AM  Pt's CT scan shows no acute abdominal. We'll repeat lactate.   12:00 PM  Pt's repeat lactate is normal. I feel she is safe to be discharged home. Discussed with patient I recommend following up with her primary care physician for her chronic pain. Discussed return precautions. We'll discharge with potassium for the next several days.     EKG Interpretation  Date/Time:  Sunday May 22 2014 07:54:35 EST Ventricular Rate:  85 PR Interval:  136 QRS Duration: 75 QT Interval:  413 QTC Calculation: 491 R Axis:   75 Text Interpretation:  Sinus rhythm Borderline T abnormalities, anterior leads Borderline prolonged QT interval Baseline wander in lead(s) III aVL aVF V3 No significant change since last tracing Confirmed by WARD,  DO, KRISTEN  [phone]) on 05/22/2014 8:01:03 AM        [place], DO 05/22/14 1215

## 2014-05-22 NOTE — ED Provider Notes (Signed)
CSN: [id number]     Arrival date & time 05/22/14  0608 History   First MD Initiated Contact with Patient 05/22/14 0615     Chief Complaint  Patient presents with  . Rectal Bleeding     (Consider location/radiation/quality/duration/timing/severity/associated sxs/prior Treatment) HPI  This a 49 year old female with multiple chronic medical problems including chronic abdominal pain, lupus, gastroparesis, chronic abdominal wound, recurrent nausea and vomiting, hemorrhoids who presents with abdominal pain, nausea, vomiting, and rectal bleeding. Patient reports one week history of worsening of her symptoms. At baseline she has abdominal pain but she states "this is worse"  pain is located around her abdominal wound and nonradiating. Currently her pain is 10 out of 10. She's not taken anything for the pain. She reports multiple episodes of nonbilious, nonbloody emesis and states that she cannot keep Zofran down. History of alcohol abuse. Patient reports "I'm trying to do better." Last drink alcohol yesterday and reports drinking 4 beers. She also reports "a lot of blood in the toilet." She has a history of external hemorrhoids and is currently taking Anusol.  She is due to see Dr. Arnoldo Morale to have removal of external hemorrhoids.  Patient denies any fevers, chest pain, shortness of breath. She does endorse dizziness and lightheadedness.  Chart reveals recent communications with GI and recent colonoscopy.  Followed by local GI clinic, Dr. Gala Romney last consulted.  Followed by Dr. Arnoldo Morale, general surgery.  Patient had 4 CT scans of the abdomen and pelvis between October 2015 and December 2015. Last CT December 2015.  Past Medical History  Diagnosis Date  . Lupus   . 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 Hypertension   . Abscess     soft tissue  . Suicide attempt   . Blood transfusion without reported diagnosis   . Chronic abdominal pain   . Chronic wound infection of abdomen   . Adrenal  mass   . Anxiety   . Depression   . GERD (gastroesophageal reflux disease)   . Sickle cell trait   . Gastroparesis Nov 2015  . Nausea and vomiting     chronic, recurrent  . Alcohol abuse   . History of Billroth II operation   . Lung nodule < 6cm on CT 04/25/2014  . Hemorrhoid     internal large  . Diverticulosis     colonoscopy 04/2014 moderat pan colonic  . Colon polyp     colonoscopy 04/2014  . Gastritis     EGD 05/2014  . Hiatal hernia   . Schatzki's ring     patent per EGD 04/2014  . Lung nodule     CT 02/2014 needs repeat 1 month   Past Surgical History  Procedure Laterality Date  . Abdominal surgery    . Abdominal hysterectomy  2013    [place]  . Debridement of abdominal wall abscess N/A 02/08/2013    Procedure: DEBRIDEMENT OF ABDOMINAL WALL ABSCESS;  Surgeon: Jamesetta So, MD;  Location: [facility];  Service: General;  Laterality: N/A;  . Billroth ii procedure       [place], first 2000, 2005/2006.  . Colonoscopy      in [place]  . Cholecystectomy    . Colonoscopy with propofol N/A 05/20/2013    Dr.Rourk- inadequate prep, normal appearing rectum, grossly normal colon aside from pancolonic diverticula, normal terminal ileum bx= unremarkable colonic mucosa. Due for early interval 2016.   Marland Kitchen Esophagogastroduodenoscopy (egd) with propofol N/A 05/20/2013    Dr.Rourk- s/p prior  gastric surgery with normal esophagus, residual gastric mucosa and patent efferent limb  . Esophageal biopsy  05/20/2013    Procedure: BIOPSIES OF ASCENDING AND SIGMOID COLON;  Surgeon: Daneil Dolin, MD;  Location: [facility];  Service: Endoscopy;;  . Wound exploration Right 06/24/2013    Procedure: exploration of traumatic wound right wrist;  Surgeon: Tennis Must, MD;  Location: [place];  Service: Orthopedics;  Laterality: Right;  . Tendon repar Right     wrist  . Adrenal turmor removal    . Esophagogastroduodenoscopy (egd) with propofol N/A 02/03/2014    Dr. Gala Romney:  s/p hemigastrectomy with retained gastric  contents. Residual gastric mucosa and efferent limb appeared normal otherwise. Query gastroparesis.   . Colonoscopy with propofol N/A 04/26/2014    DPO:EUMPNT ileum/one colon polyp removed/moderate pan-colonic diverticulosis/large internal hemorrhoids  . Esophagogastroduodenoscopy (egd) with propofol N/A 04/26/2014    IRW:[id number] ring/small HH/mild non-erosive gasrtitis/normal anastomosis  . Esophageal biopsy  04/26/2014    Procedure: BIOPSIES;  Surgeon: Danie Binder, MD;  Location: [facility];  Service: Endoscopy;;   Family History  Problem Relation Age of Onset  . Brain cancer Son   . Schizophrenia Son   . Cancer Son     brain  . Breast cancer Maternal Aunt   . Bipolar disorder Maternal Aunt   . Drug abuse Maternal Aunt   . Colon cancer Maternal Grandmother     late 57s, early 22s  . Lung cancer Father   . Cancer Father     mets  . Liver disease Neg Hx   . Drug abuse Mother   . Drug abuse Sister   . Drug abuse Brother   . Bipolar disorder Paternal Grandfather   . Bipolar disorder Cousin    History  Substance Use Topics  . Smoking status: Current Every Day Smoker -- 0.25 packs/day for 35 years    Types: Cigarettes  . Smokeless tobacco: Never Used  . Alcohol Use: 7.2 oz/week    12 Cans of beer, 0 Standard drinks or equivalent per week     Comment: ETOH abuse. as of 04/25/14, states drinking 1-2 beers per day   OB History    No data available     Review of Systems  Constitutional: Negative for fever.  Respiratory: Negative for cough, chest tightness and shortness of breath.   Cardiovascular: Negative for chest pain.  Gastrointestinal: Positive for nausea, vomiting, abdominal pain and blood in stool. Negative for diarrhea.  Genitourinary: Negative for dysuria.  Musculoskeletal: Negative for back pain.  Skin: Positive for wound.  Neurological: Positive for light-headedness. Negative for headaches.  Psychiatric/Behavioral: Negative for confusion.  All other systems reviewed  and are negative.     Allergies  Codeine and Morphine and related  Home Medications   Prior to Admission medications   Medication Sig Start Date End Date Taking? Authorizing Provider  ARIPiprazole (ABILIFY) 5 MG tablet Take 1 tablet (5 mg total) by mouth 2 (two) times daily. 07/16/13  Yes Levonne Spiller, MD  escitalopram (LEXAPRO) 20 MG tablet Take 1 tablet (20 mg total) by mouth daily. 07/16/13 07/16/14 Yes Levonne Spiller, MD  fluticasone (FLONASE) 50 MCG/ACT nasal spray Place 1 spray into both nostrils daily.   Yes Historical Provider, MD  hydrocortisone (ANUSOL-HC) 2.5 % rectal cream Place 1 application rectally 2 (two) times daily. 05/17/14  Yes Orvil Feil, NP  lisinopril (PRINIVIL,ZESTRIL) 10 MG tablet Take 10 mg by mouth daily.   Yes Historical Provider, MD  metoCLOPramide (  REGLAN) 10 MG tablet Take 1 tablet (10 mg total) by mouth 4 (four) times daily -  before meals and at bedtime. 04/06/14  Yes Lezlie Octave Black, NP  mupirocin ointment (BACTROBAN) 2 % Place into the nose 2 (two) times daily. 12/13/13  Yes Maryann Mikhail, DO  ondansetron (ZOFRAN) 4 MG tablet Take 1 tablet (4 mg total) by mouth 3 (three) times daily. With meals Patient taking differently: Take 4 mg by mouth 3 (three) times daily as needed for nausea or vomiting. With meals 01/21/14  Yes Orvil Feil, NP  pantoprazole (PROTONIX) 40 MG tablet Take 1 tablet (40 mg total) by mouth 2 (two) times daily before a meal. 01/21/14  Yes Orvil Feil, NP  sucralfate (CARAFATE) 1 G tablet Take 1 g by mouth 4 (four) times daily.   Yes Historical Provider, MD  HYDROcodone-acetaminophen (NORCO/VICODIN) 5-325 MG per tablet Take 1 tablet by mouth every 6 (six) hours as needed for severe pain. Patient not taking: Reported on 04/24/2014 04/06/14   Lezlie Octave Black, NP   BP 130/94 mmHg  Pulse 104  Temp(Src) 97.8 F (36.6 C) (Oral)  Resp 20  Ht 5\' 3"  (1.6 m)  Wt 131 lb (59.421 kg)  BMI 23.21 kg/m2  SpO2 98% Physical Exam  Constitutional: She is oriented  to person, place, and time. She appears well-developed and well-nourished. No distress.  HENT:  Head: Normocephalic and atraumatic.  Mouth/Throat: Oropharynx is clear and moist.  Neck: Neck supple.  Cardiovascular: Normal rate, regular rhythm and normal heart sounds.   Pulmonary/Chest: Effort normal. No respiratory distress. She has wheezes.  Abdominal: Soft. Bowel sounds are normal.  Extensive abdominal scarring with vertical midline incision and chronic open wound just inferior to the umbilicus with scant serous drainage, abdomen soft largely nontender, no signs of peritonitis, normal bowel sounds  Genitourinary: Guaiac positive stool.  External hemorrhoids, nonthrombosed, no gross blood noted, normal rectal tone  Musculoskeletal: She exhibits no edema.  Neurological: She is alert and oriented to person, place, and time.  Skin: Skin is warm and dry.  Psychiatric: She has a normal mood and affect.  Nursing note and vitals reviewed.   ED Course  Procedures (including critical care time) Labs Review Labs Reviewed  POC OCCULT BLOOD, ED - Abnormal; Notable for the following:    Fecal Occult Bld POSITIVE (*)    All other components within normal limits  CBC WITH DIFFERENTIAL/PLATELET  COMPREHENSIVE METABOLIC PANEL  LIPASE, BLOOD  LACTIC ACID, PLASMA  LACTIC ACID, PLASMA  URINALYSIS, ROUTINE W REFLEX MICROSCOPIC    Imaging Review No results found.   EKG Interpretation None      MDM   Final diagnoses:  None   Patient with history of chronic abdominal pain and nausea and vomiting presents with the same as well as complaints of rectal bleeding. Nontoxic on exam. Mildly tachycardic to 104. No signs of peritonitis and abdominal exam reassuring. Patient given fluids, pain, and nausea medication. Lactate mildly elevated at 3.49 - may be secondary to dehydration and vomiting.  Further lab work pending. Acute abdominal series to be obtained; if labwork reassuring and repeat lactate,  would reexamine. Patient has had multiple CT images and initial exam was benign abdomen.  Signed out to Dr. Leonides Schanz.  Merryl Hacker, MD 05/22/14 2250

## 2014-05-22 NOTE — ED Notes (Signed)
Dressing intact to lower abdomen.

## 2014-05-24 DIAGNOSIS — G894 Chronic pain syndrome: Secondary | ICD-10-CM | POA: Diagnosis not present

## 2014-05-24 DIAGNOSIS — L02211 Cutaneous abscess of abdominal wall: Secondary | ICD-10-CM | POA: Diagnosis not present

## 2014-05-24 DIAGNOSIS — Z87898 Personal history of other specified conditions: Secondary | ICD-10-CM | POA: Diagnosis not present

## 2014-05-24 DIAGNOSIS — Z79899 Other long term (current) drug therapy: Secondary | ICD-10-CM | POA: Diagnosis not present

## 2014-05-24 DIAGNOSIS — Z79891 Long term (current) use of opiate analgesic: Secondary | ICD-10-CM | POA: Diagnosis not present

## 2014-05-24 DIAGNOSIS — R109 Unspecified abdominal pain: Secondary | ICD-10-CM | POA: Diagnosis not present

## 2014-06-14 ENCOUNTER — Emergency Department (HOSPITAL_COMMUNITY): Payer: Medicare Other

## 2014-06-14 ENCOUNTER — Encounter (HOSPITAL_COMMUNITY): Payer: Self-pay | Admitting: *Deleted

## 2014-06-14 ENCOUNTER — Emergency Department (HOSPITAL_COMMUNITY)
Admission: EM | Admit: 2014-06-14 | Discharge: 2014-06-14 | Disposition: A | Discharge status: Home / Self Care | Payer: Medicare Other | Attending: Emergency Medicine | Admitting: Emergency Medicine

## 2014-06-14 DIAGNOSIS — J9809 Other diseases of bronchus, not elsewhere classified: Secondary | ICD-10-CM | POA: Diagnosis not present

## 2014-06-14 DIAGNOSIS — K625 Hemorrhage of anus and rectum: Secondary | ICD-10-CM | POA: Diagnosis not present

## 2014-06-14 DIAGNOSIS — R109 Unspecified abdominal pain: Secondary | ICD-10-CM | POA: Diagnosis not present

## 2014-06-14 DIAGNOSIS — F419 Anxiety disorder, unspecified: Secondary | ICD-10-CM | POA: Insufficient documentation

## 2014-06-14 DIAGNOSIS — R112 Nausea with vomiting, unspecified: Secondary | ICD-10-CM

## 2014-06-14 DIAGNOSIS — Z720 Tobacco use: Secondary | ICD-10-CM | POA: Insufficient documentation

## 2014-06-14 DIAGNOSIS — Z7951 Long term (current) use of inhaled steroids: Secondary | ICD-10-CM | POA: Diagnosis not present

## 2014-06-14 DIAGNOSIS — Q394 Esophageal web: Secondary | ICD-10-CM | POA: Insufficient documentation

## 2014-06-14 DIAGNOSIS — Z872 Personal history of diseases of the skin and subcutaneous tissue: Secondary | ICD-10-CM | POA: Insufficient documentation

## 2014-06-14 DIAGNOSIS — K219 Gastro-esophageal reflux disease without esophagitis: Secondary | ICD-10-CM | POA: Insufficient documentation

## 2014-06-14 DIAGNOSIS — F329 Major depressive disorder, single episode, unspecified: Secondary | ICD-10-CM | POA: Insufficient documentation

## 2014-06-14 DIAGNOSIS — D649 Anemia, unspecified: Secondary | ICD-10-CM | POA: Diagnosis not present

## 2014-06-14 DIAGNOSIS — I10 Essential (primary) hypertension: Secondary | ICD-10-CM | POA: Insufficient documentation

## 2014-06-14 DIAGNOSIS — Z79899 Other long term (current) drug therapy: Secondary | ICD-10-CM | POA: Diagnosis not present

## 2014-06-14 DIAGNOSIS — Z86010 Personal history of colonic polyps: Secondary | ICD-10-CM | POA: Diagnosis not present

## 2014-06-14 DIAGNOSIS — Z7952 Long term (current) use of systemic steroids: Secondary | ICD-10-CM | POA: Insufficient documentation

## 2014-06-14 DIAGNOSIS — Z8639 Personal history of other endocrine, nutritional and metabolic disease: Secondary | ICD-10-CM | POA: Insufficient documentation

## 2014-06-14 DIAGNOSIS — R7989 Other specified abnormal findings of blood chemistry: Secondary | ICD-10-CM

## 2014-06-14 DIAGNOSIS — R197 Diarrhea, unspecified: Secondary | ICD-10-CM | POA: Diagnosis not present

## 2014-06-14 DIAGNOSIS — R740 Nonspecific elevation of levels of transaminase and lactic acid dehydrogenase [LDH]: Secondary | ICD-10-CM | POA: Diagnosis not present

## 2014-06-14 LAB — CBC WITH DIFFERENTIAL/PLATELET
BASOS PCT: 0 % (ref 0–1)
Basophils Absolute: 0 10*3/uL (ref 0.0–0.1)
EOS ABS: 0.1 10*3/uL (ref 0.0–0.7)
Eosinophils Relative: 1 % (ref 0–5)
HEMATOCRIT: 30.6 % — AB (ref 36.0–46.0)
Hemoglobin: 10.9 g/dL — ABNORMAL LOW (ref 12.0–15.0)
LYMPHS PCT: 44 % (ref 12–46)
Lymphs Abs: 2.2 10*3/uL (ref 0.7–4.0)
MCH: 30.9 pg (ref 26.0–34.0)
MCHC: 35.6 g/dL (ref 30.0–36.0)
MCV: 86.7 fL (ref 78.0–100.0)
MONOS PCT: 15 % — AB (ref 3–12)
Monocytes Absolute: 0.7 10*3/uL (ref 0.1–1.0)
Neutro Abs: 2 10*3/uL (ref 1.7–7.7)
Neutrophils Relative %: 40 % — ABNORMAL LOW (ref 43–77)
Platelets: 218 10*3/uL (ref 150–400)
RBC: 3.53 MIL/uL — ABNORMAL LOW (ref 3.87–5.11)
RDW: 18.6 % — ABNORMAL HIGH (ref 11.5–15.5)
WBC: 5 10*3/uL (ref 4.0–10.5)

## 2014-06-14 LAB — COMPREHENSIVE METABOLIC PANEL
ALT: 33 U/L (ref 0–35)
AST: 58 U/L — ABNORMAL HIGH (ref 0–37)
Albumin: 3.7 g/dL (ref 3.5–5.2)
Alkaline Phosphatase: 85 U/L (ref 39–117)
Anion gap: 14 (ref 5–15)
BUN: 8 mg/dL (ref 6–23)
CHLORIDE: 102 mmol/L (ref 96–112)
CO2: 21 mmol/L (ref 19–32)
Calcium: 8.6 mg/dL (ref 8.4–10.5)
Creatinine, Ser: 0.63 mg/dL (ref 0.50–1.10)
GFR calc Af Amer: >90 mL/min (ref >90)
GFR calc non Af Amer: >90 mL/min (ref >90)
Glucose, Bld: 73 mg/dL (ref 70–99)
Potassium: 3.4 mmol/L — ABNORMAL LOW (ref 3.5–5.1)
SODIUM: 137 mmol/L (ref 135–145)
Total Bilirubin: 0.5 mg/dL (ref 0.3–1.2)
Total Protein: 7.2 g/dL (ref 6.0–8.3)

## 2014-06-14 LAB — LACTIC ACID, PLASMA
LACTIC ACID, VENOUS: 3.5 mmol/L — AB (ref 0.5–2.0)
LACTIC ACID, VENOUS: 1.9 mmol/L (ref 0.5–2.0)

## 2014-06-14 LAB — URINALYSIS, ROUTINE W REFLEX MICROSCOPIC
Bilirubin Urine: NEGATIVE
GLUCOSE, UA: NEGATIVE mg/dL
Hgb urine dipstick: NEGATIVE
KETONES UR: 15 mg/dL — AB
Leukocytes, UA: NEGATIVE
Nitrite: NEGATIVE
PH: 5.5 (ref 5.0–8.0)
PROTEIN: NEGATIVE mg/dL
Specific Gravity, Urine: 1.015 (ref 1.005–1.030)
Urobilinogen, UA: 0.2 mg/dL (ref 0.0–1.0)

## 2014-06-14 LAB — POC OCCULT BLOOD, ED: Fecal Occult Bld: POSITIVE — AB

## 2014-06-14 LAB — AMYLASE: AMYLASE: 50 U/L (ref 0–105)

## 2014-06-14 LAB — LIPASE, BLOOD: LIPASE: 39 U/L (ref 11–59)

## 2014-06-14 MED ORDER — OXYCODONE-ACETAMINOPHEN 5-325 MG PO TABS: 1.0000 | ORAL_TABLET | ORAL | Status: DC | PRN

## 2014-06-14 MED ORDER — HYDROMORPHONE HCL 1 MG/ML IJ SOLN
1.0000 mg | Freq: Once | INTRAMUSCULAR | Status: AC
Start: 2014-06-14 — End: 2014-06-14
  Administered 2014-06-14: 1 mg via INTRAVENOUS
  Filled 2014-06-14: qty 1

## 2014-06-14 MED ORDER — HYDROMORPHONE HCL 1 MG/ML IJ SOLN
1.0000 mg | Freq: Once | INTRAMUSCULAR | Status: AC
  Administered 2014-06-14: 1 mg via INTRAVENOUS
  Filled 2014-06-14: qty 1

## 2014-06-14 MED ORDER — PROMETHAZINE HCL 25 MG RE SUPP: 25.0000 mg | Freq: Four times a day (QID) | RECTAL | Status: DC | PRN

## 2014-06-14 MED ORDER — SODIUM CHLORIDE 0.9 % IV SOLN
1000.0000 mL | Freq: Once | INTRAVENOUS | Status: AC
  Administered 2014-06-14: 1000 mL via INTRAVENOUS

## 2014-06-14 MED ORDER — SODIUM CHLORIDE 0.9 % IV SOLN: 1000.0000 mL | INTRAVENOUS | Status: DC

## 2014-06-14 MED ORDER — PROMETHAZINE HCL 25 MG PO TABS: 25.0000 mg | ORAL_TABLET | Freq: Four times a day (QID) | ORAL | Status: DC | PRN

## 2014-06-14 MED ORDER — ONDANSETRON HCL 4 MG/2ML IJ SOLN
4.0000 mg | Freq: Once | INTRAMUSCULAR | Status: AC
  Administered 2014-06-14: 4 mg via INTRAVENOUS
  Filled 2014-06-14: qty 2

## 2014-06-14 MED ORDER — METOCLOPRAMIDE HCL 5 MG/ML IJ SOLN
10.0000 mg | Freq: Once | INTRAMUSCULAR | Status: AC
  Administered 2014-06-14: 10 mg via INTRAVENOUS
  Filled 2014-06-14: qty 2

## 2014-06-14 NOTE — Discharge Instructions (Signed)
Abdominal Pain °Many things can cause abdominal pain. Usually, abdominal pain is not caused by a disease and will improve without treatment. It can often be observed and treated at home. Your health care provider will do a physical exam and possibly order blood tests and X-rays to help determine the seriousness of your pain. However, in many cases, more time must pass before a clear cause of the pain can be found. Before that point, your health care provider may not know if you need more testing or further treatment. °HOME CARE INSTRUCTIONS  °Monitor your abdominal pain for any changes. The following actions may help to alleviate any discomfort you are experiencing: °· Only take over-the-counter or prescription medicines as directed by your health care provider. °· Do not take laxatives unless directed to do so by your health care provider. °· Try a clear liquid diet (broth, tea, or water) as directed by your health care provider. Slowly move to a bland diet as tolerated. °SEEK MEDICAL CARE IF: °· You have unexplained abdominal pain. °· You have abdominal pain associated with nausea or diarrhea. °· You have pain when you urinate or have a bowel movement. °· You experience abdominal pain that wakes you in the night. °· You have abdominal pain that is worsened or improved by eating food. °· You have abdominal pain that is worsened with eating fatty foods. °· You have a fever. °SEEK IMMEDIATE MEDICAL CARE IF:  °· Your pain does not go away within 2 hours. °· You keep throwing up (vomiting). °· Your pain is felt only in portions of the abdomen, such as the right side or the left lower portion of the abdomen. °· You pass bloody or black tarry stools. °MAKE SURE YOU: °· Understand these instructions.   °· Will watch your condition.   °· Will get help right away if you are not doing well or get worse.   °Document Released: 12/12/2004 Document Revised: 03/09/2013 Document Reviewed: 11/11/2012 °[employer]® Patient Information  ©2015 [employer]. This information is not intended to replace advice given to you by your health care provider. Make sure you discuss any questions you have with your health care provider. ° °Nausea and Vomiting °Nausea is a sick feeling that often comes before throwing up (vomiting). Vomiting is a reflex where stomach contents come out of your mouth. Vomiting can cause severe loss of body fluids (dehydration). Children and elderly adults can become dehydrated quickly, especially if they also have diarrhea. Nausea and vomiting are symptoms of a condition or disease. It is important to find the cause of your symptoms. °CAUSES  °· Direct irritation of the stomach lining. This irritation can result from increased acid production (gastroesophageal reflux disease), infection, food poisoning, taking certain medicines (such as nonsteroidal anti-inflammatory drugs), alcohol use, or tobacco use. °· Signals from the brain. These signals could be caused by a headache, heat exposure, an inner ear disturbance, increased pressure in the brain from injury, infection, a tumor, or a concussion, pain, emotional stimulus, or metabolic problems. °· An obstruction in the gastrointestinal tract (bowel obstruction). °· Illnesses such as diabetes, hepatitis, gallbladder problems, appendicitis, kidney problems, cancer, sepsis, atypical symptoms of a heart attack, or eating disorders. °· Medical treatments such as chemotherapy and radiation. °· Receiving medicine that makes you sleep (general anesthetic) during surgery. °DIAGNOSIS °Your caregiver may ask for tests to be done if the problems do not improve after a few days. Tests may also be done if symptoms are severe or if the reason for the nausea   and vomiting is not clear. Tests may include:  Urine tests.  Blood tests.  Stool tests.  Cultures (to look for evidence of infection).  X-rays or other imaging studies. Test results can help your caregiver make decisions about  treatment or the need for additional tests. TREATMENT You need to stay well hydrated. Drink frequently but in small amounts.You may wish to drink water, sports drinks, clear broth, or eat frozen ice pops or gelatin dessert to help stay hydrated.When you eat, eating slowly may help prevent nausea.There are also some antinausea medicines that may help prevent nausea. HOME CARE INSTRUCTIONS   Take all medicine as directed by your caregiver.  If you do not have an appetite, do not force yourself to eat. However, you must continue to drink fluids.  If you have an appetite, eat a normal diet unless your caregiver tells you differently.  Eat a variety of complex carbohydrates (rice, wheat, potatoes, bread), lean meats, yogurt, fruits, and vegetables.  Avoid high-fat foods because they are more difficult to digest.  Drink enough water and fluids to keep your urine clear or pale yellow.  If you are dehydrated, ask your caregiver for specific rehydration instructions. Signs of dehydration may include:  Severe thirst.  Dry lips and mouth.  Dizziness.  Dark urine.  Decreasing urine frequency and amount.  Confusion.  Rapid breathing or pulse. SEEK IMMEDIATE MEDICAL CARE IF:   You have blood or brown flecks (like coffee grounds) in your vomit.  You have black or bloody stools.  You have a severe headache or stiff neck.  You are confused.  You have severe abdominal pain.  You have chest pain or trouble breathing.  You do not urinate at least once every 8 hours.  You develop cold or clammy skin.  You continue to vomit for longer than 24 to 48 hours.  You have a fever. MAKE SURE YOU:   Understand these instructions.  Will watch your condition.  Will get help right away if you are not doing well or get worse. Document Released: 03/04/2005 Document Revised: 05/27/2011 Document Reviewed: 08/01/2010 [facility] Patient Information 2015 [place], [place]. This information is not  intended to replace advice given to you by your health care provider. Make sure you discuss any questions you have with your health care provider.  Acetaminophen; Oxycodone tablets What is this medicine? ACETAMINOPHEN; OXYCODONE (a set a MEE noe fen; ox i KOE done) is a pain reliever. It is used to treat mild to moderate pain. This medicine may be used for other purposes; ask your health care provider or pharmacist if you have questions. COMMON BRAND NAME(S): Endocet, Magnacet, Narvox, Percocet, Perloxx, Primalev, Primlev, Roxicet, Xolox What should I tell my health care provider before I take this medicine? They need to know if you have any of these conditions: -brain tumor -Crohn's disease, inflammatory bowel disease, or ulcerative colitis -drug abuse or addiction -head injury -heart or circulation problems -if you often drink alcohol -kidney disease or problems going to the bathroom -liver disease -lung disease, asthma, or breathing problems -an unusual or allergic reaction to acetaminophen, oxycodone, other opioid analgesics, other medicines, foods, dyes, or preservatives -pregnant or trying to get pregnant -breast-feeding How should I use this medicine? Take this medicine by mouth with a full glass of water. Follow the directions on the prescription label. Take your medicine at regular intervals. Do not take your medicine more often than directed. Talk to your pediatrician regarding the use of this medicine in children.  Special care may be needed. Patients over 28 years old may have a stronger reaction and need a smaller dose. Overdosage: If you think you have taken too much of this medicine contact a poison control center or emergency room at once. NOTE: This medicine is only for you. Do not share this medicine with others. What if I miss a dose? If you miss a dose, take it as soon as you can. If it is almost time for your next dose, take only that dose. Do not take double or extra  doses. What may interact with this medicine? -alcohol -antihistamines -barbiturates like amobarbital, butalbital, butabarbital, methohexital, pentobarbital, phenobarbital, thiopental, and secobarbital -benztropine -drugs for bladder problems like solifenacin, trospium, oxybutynin, tolterodine, hyoscyamine, and methscopolamine -drugs for breathing problems like ipratropium and tiotropium -drugs for certain stomach or intestine problems like propantheline, homatropine methylbromide, glycopyrrolate, atropine, belladonna, and dicyclomine -general anesthetics like etomidate, ketamine, nitrous oxide, propofol, desflurane, enflurane, halothane, isoflurane, and sevoflurane -medicines for depression, anxiety, or psychotic disturbances -medicines for sleep -muscle relaxants -naltrexone -narcotic medicines (opiates) for pain -phenothiazines like perphenazine, thioridazine, chlorpromazine, mesoridazine, fluphenazine, prochlorperazine, promazine, and trifluoperazine -scopolamine -tramadol -trihexyphenidyl This list may not describe all possible interactions. Give your health care provider a list of all the medicines, herbs, non-prescription drugs, or dietary supplements you use. Also tell them if you smoke, drink alcohol, or use illegal drugs. Some items may interact with your medicine. What should I watch for while using this medicine? Tell your doctor or health care professional if your pain does not go away, if it gets worse, or if you have new or a different type of pain. You may develop tolerance to the medicine. Tolerance means that you will need a higher dose of the medication for pain relief. Tolerance is normal and is expected if you take this medicine for a long time. Do not suddenly stop taking your medicine because you may develop a severe reaction. Your body becomes used to the medicine. This does NOT mean you are addicted. Addiction is a behavior related to getting and using a drug for a  non-medical reason. If you have pain, you have a medical reason to take pain medicine. Your doctor will tell you how much medicine to take. If your doctor wants you to stop the medicine, the dose will be slowly lowered over time to avoid any side effects. You may get drowsy or dizzy. Do not drive, use machinery, or do anything that needs mental alertness until you know how this medicine affects you. Do not stand or sit up quickly, especially if you are an older patient. This reduces the risk of dizzy or fainting spells. Alcohol may interfere with the effect of this medicine. Avoid alcoholic drinks. There are different types of narcotic medicines (opiates) for pain. If you take more than one type at the same time, you may have more side effects. Give your health care provider a list of all medicines you use. Your doctor will tell you how much medicine to take. Do not take more medicine than directed. Call emergency for help if you have problems breathing. The medicine will cause constipation. Try to have a bowel movement at least every 2 to 3 days. If you do not have a bowel movement for 3 days, call your doctor or health care professional. Do not take Tylenol (acetaminophen) or medicines that have acetaminophen with this medicine. Too much acetaminophen can be very dangerous. Many nonprescription medicines contain acetaminophen. Always read the labels carefully to  avoid taking more acetaminophen. What side effects may I notice from receiving this medicine? Side effects that you should report to your doctor or health care professional as soon as possible: -allergic reactions like skin rash, itching or hives, swelling of the face, lips, or tongue -breathing difficulties, wheezing -confusion -light headedness or fainting spells -severe stomach pain -unusually weak or tired -yellowing of the skin or the whites of the eyes Side effects that usually do not require medical attention (report to your doctor or  health care professional if they continue or are bothersome): -dizziness -drowsiness -nausea -vomiting This list may not describe all possible side effects. Call your doctor for medical advice about side effects. You may report side effects to FDA at 1-800-FDA-1088. Where should I keep my medicine? Keep out of the reach of children. This medicine can be abused. Keep your medicine in a safe place to protect it from theft. Do not share this medicine with anyone. Selling or giving away this medicine is dangerous and against the law. Store at room temperature between 20 and 25 degrees C (68 and 77 degrees F). Keep container tightly closed. Protect from light. This medicine may cause accidental overdose and death if it is taken by other adults, children, or pets. Flush any unused medicine down the toilet to reduce the chance of harm. Do not use the medicine after the expiration date. NOTE: This sheet is a summary. It may not cover all possible information. If you have questions about this medicine, talk to your doctor, pharmacist, or health care provider.  2015, [employer]. (2012-10-26 13:17:35)  Promethazine tablets What is this medicine? PROMETHAZINE (proe METH a zeen) is an antihistamine. It is used to treat allergic reactions and to treat or prevent nausea and vomiting from illness or motion sickness. It is also used to make you sleep before surgery, and to help treat pain or nausea after surgery. This medicine may be used for other purposes; ask your health care provider or pharmacist if you have questions. COMMON BRAND NAME(S): Phenergan What should I tell my health care provider before I take this medicine? They need to know if you have any of these conditions: -glaucoma -high blood pressure or heart disease -kidney disease -liver disease -lung or breathing disease, like asthma -prostate trouble -pain or difficulty passing urine -seizures -an unusual or allergic reaction to  promethazine or phenothiazines, other medicines, foods, dyes, or preservatives -pregnant or trying to get pregnant -breast-feeding How should I use this medicine? Take this medicine by mouth with a glass of water. Follow the directions on the prescription label. Take your doses at regular intervals. Do not take your medicine more often than directed. Talk to your pediatrician regarding the use of this medicine in children. Special care may be needed. This medicine should not be given to infants and children younger than 29 years old. Overdosage: If you think you have taken too much of this medicine contact a poison control center or emergency room at once. NOTE: This medicine is only for you. Do not share this medicine with others. What if I miss a dose? If you miss a dose, take it as soon as you can. If it is almost time for your next dose, take only that dose. Do not take double or extra doses. What may interact with this medicine? Do not take this medicine with any of the following medications: -cisapride -dofetilide -dronedarone -MAOIs like Carbex, Eldepryl, Marplan, Nardil, Parnate -pimozide -quinidine, including dextromethorphan; quinidine -thioridazine -ziprasidone  This medicine may also interact with the following medications: -certain medicines for depression, anxiety, or psychotic disturbances -certain medicines for anxiety or sleep -certain medicines for seizures like carbamazepine, phenobarbital, phenytoin -certain medicines for movement abnormalities as in Parkinson's disease, or for gastrointestinal problems -epinephrine -medicines for allergies or colds -muscle relaxants -narcotic medicines for pain -other medicines that prolong the QT interval (cause an abnormal heart rhythm) -tramadol -trimethobenzamide This list may not describe all possible interactions. Give your health care provider a list of all the medicines, herbs, non-prescription drugs, or dietary supplements  you use. Also tell them if you smoke, drink alcohol, or use illegal drugs. Some items may interact with your medicine. What should I watch for while using this medicine? Tell your doctor or health care professional if your symptoms do not start to get better in 1 to 2 days. You may get drowsy or dizzy. Do not drive, use machinery, or do anything that needs mental alertness until you know how this medicine affects you. To reduce the risk of dizzy or fainting spells, do not stand or sit up quickly, especially if you are an older patient. Alcohol may increase dizziness and drowsiness. Avoid alcoholic drinks. Your mouth may get dry. Chewing sugarless gum or sucking hard candy, and drinking plenty of water may help. Contact your doctor if the problem does not go away or is severe. This medicine may cause dry eyes and blurred vision. If you wear contact lenses you may feel some discomfort. Lubricating drops may help. See your eye doctor if the problem does not go away or is severe. This medicine can make you more sensitive to the sun. Keep out of the sun. If you cannot avoid being in the sun, wear protective clothing and use sunscreen. Do not use sun lamps or tanning beds/booths. If you are diabetic, check your blood-sugar levels regularly. What side effects may I notice from receiving this medicine? Side effects that you should report to your doctor or health care professional as soon as possible: -blurred vision -irregular heartbeat, palpitations or chest pain -muscle or facial twitches -pain or difficulty passing urine -seizures -skin rash -slowed or shallow breathing -unusual bleeding or bruising -yellowing of the eyes or skin Side effects that usually do not require medical attention (report to your doctor or health care professional if they continue or are bothersome): -headache -nightmares, agitation, nervousness, excitability, not able to sleep (these are more likely in children) -stuffy  nose This list may not describe all possible side effects. Call your doctor for medical advice about side effects. You may report side effects to FDA at 1-800-FDA-1088. Where should I keep my medicine? Keep out of the reach of children. Store at room temperature, between 20 and 25 degrees C (68 and 77 degrees F). Protect from light. Throw away any unused medicine after the expiration date. NOTE: This sheet is a summary. It may not cover all possible information. If you have questions about this medicine, talk to your doctor, pharmacist, or health care provider.  2015, [employer]. (2012-11-03 15:04:46)  Promethazine suppositories What is this medicine? PROMETHAZINE (proe METH a zeen) is an antihistamine. It is used to treat allergic reactions and to treat or prevent nausea and vomiting from illness or motion sickness. It is also used to make you sleep before surgery, and to help treat pain or nausea after surgery. This medicine may be used for other purposes; ask your health care provider or pharmacist if you have questions. COMMON BRAND NAME(S):  Phenadoz, Phenergan, Promethegan What should I tell my health care provider before I take this medicine? They need to know if you have any of these conditions: -glaucoma -high blood pressure or heart disease -kidney disease -liver disease -lung or breathing disease, like asthma -prostate trouble -pain or difficulty passing urine -seizures -an unusual or allergic reaction to promethazine or phenothiazines, other medicines, foods, dyes, or preservatives -pregnant or trying to get pregnant -breast-feeding How should I use this medicine? This medicine is for rectal use only. Do not take by mouth. Wash your hands before and after use. Take off the foil wrapping. Wet the tip of the suppository with cold tap water to make it easier to use. Lie on your side with your lower leg straightened out and your upper leg bent forward toward your  stomach. Lift upper buttock to expose the rectal area. Apply gentle pressure to insert the suppository completely into the rectum, pointed end first. Hold buttocks together for a few seconds. Remain lying down for about 15 minutes to avoid having the suppository come out. Do not use more often than directed. Talk to your pediatrician regarding the use of this medicine in children. Special care may be needed. This medicine should not be given to infants and children younger than 70 years old. Overdosage: If you think you have taken too much of this medicine contact a poison control center or emergency room at once. NOTE: This medicine is only for you. Do not share this medicine with others. What if I miss a dose? If you miss a dose, use it as soon as you can. If it is almost time for your next dose, use only that dose. Do not use double doses. What may interact with this medicine? Do not take this medicine with any of the following medications: -cisapride -dofetilide -dronedarone -MAOIs like Carbex, Eldepryl, Marplan, Nardil, Parnate -pimozide -quinidine, including dextromethorphan; quinidine -thioridazine -ziprasidone This medicine may also interact with the following medications: -certain medicines for depression, anxiety, or psychotic disturbances -certain medicines for anxiety or sleep -certain medicines for seizures like carbamazepine, phenobarbital, phenytoin -certain medicines for movement abnormalities as in Parkinson's disease, or for gastrointestinal problems -epinephrine -medicines for allergies or colds -muscle relaxants -narcotic medicines for pain -other medicines that prolong the QT interval (cause an abnormal heart rhythm) -tramadol -trimethobenzamide This list may not describe all possible interactions. Give your health care provider a list of all the medicines, herbs, non-prescription drugs, or dietary supplements you use. Also tell them if you smoke, drink alcohol, or use  illegal drugs. Some items may interact with your medicine. What should I watch for while using this medicine? Tell your doctor or health care professional if your symptoms do not start to get better in 1 to 2 days. You may get drowsy or dizzy. Do not drive, use machinery, or do anything that needs mental alertness until you know how this medicine affects you. To reduce the risk of dizzy or fainting spells, do not stand or sit up quickly, especially if you are an older patient. Alcohol may increase dizziness and drowsiness. Avoid alcoholic drinks. Your mouth may get dry. Chewing sugarless gum or sucking hard candy, and drinking plenty of water may help. Contact your doctor if the problem does not go away or is severe. This medicine may cause dry eyes and blurred vision. If you wear contact lenses you may feel some discomfort. Lubricating drops may help. See your eye doctor if the problem does not go away or  is severe. This medicine can make you more sensitive to the sun. Keep out of the sun. If you cannot avoid being in the sun, wear protective clothing and use sunscreen. Do not use sun lamps or tanning beds/booths. If you are diabetic, check your blood-sugar levels regularly. What side effects may I notice from receiving this medicine? Side effects that you should report to your doctor or health care professional as soon as possible: -blurred vision -irregular heartbeat, palpitations or chest pain -muscle or facial twitches -pain or difficulty passing urine -seizures -skin rash -slowed or shallow breathing -unusual bleeding or bruising -yellowing of the eyes or skin Side effects that usually do not require medical attention (report to your doctor or health care professional if they continue or are bothersome): -headache -nightmares, agitation, nervousness, excitability, not able to sleep (these are more likely in children) -stuffy nose This list may not describe all possible side effects. Call  your doctor for medical advice about side effects. You may report side effects to FDA at 1-800-FDA-1088. Where should I keep my medicine? Keep out of the reach of children. Store in a refrigerator between 2 and 8 degrees C (36 and 46 degrees F). Throw away any unused medicine after the expiration date. NOTE: This sheet is a summary. It may not cover all possible information. If you have questions about this medicine, talk to your doctor, pharmacist, or health care provider.  2015, [employer]. (2012-11-04 15:57:29)

## 2014-06-14 NOTE — ED Provider Notes (Signed)
CSN: [id number]     Arrival date & time 06/14/14  0104 History   First MD Initiated Contact with Patient 06/14/14 0144     Chief Complaint  Patient presents with  . Emesis     (Consider location/radiation/quality/duration/timing/severity/associated sxs/prior Treatment) Patient is a 49 y.o. female presenting with vomiting. The history is provided by the patient.  Emesis She complains of generalized abdominal pain, nausea, vomiting, rectal bleeding for the last 3 days. Pain is severe and she rates it at 9/10. Nothing makes it better nothing makes it worse. There's been no blood and when she is been vomiting but she has had bright red blood per rectum. She states that the entire toilet bowl was red, but she cannot quantify the amount. She has had some dizziness and lightheadedness. There's been no fever or chills. She has had similar symptoms in the past. There has been no treatment at home.  Past Medical History  Diagnosis Date  . Lupus   . 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 Hypertension   . Abscess     soft tissue  . Suicide attempt   . Blood transfusion without reported diagnosis   . Chronic abdominal pain   . Chronic wound infection of abdomen   . Adrenal mass   . Anxiety   . Depression   . GERD (gastroesophageal reflux disease)   . Sickle cell trait   . Gastroparesis Nov 2015  . Nausea and vomiting     chronic, recurrent  . Alcohol abuse   . History of Billroth II operation   . Lung nodule < 6cm on CT 04/25/2014  . Hemorrhoid     internal large  . Diverticulosis     colonoscopy 04/2014 moderat pan colonic  . Colon polyp     colonoscopy 04/2014  . Gastritis     EGD 05/2014  . Hiatal hernia   . Schatzki's ring     patent per EGD 04/2014  . Lung nodule     CT 02/2014 needs repeat 1 month   Past Surgical History  Procedure Laterality Date  . Abdominal surgery    . Abdominal hysterectomy  2013    [place]  . Debridement of abdominal wall abscess N/A  02/08/2013    Procedure: DEBRIDEMENT OF ABDOMINAL WALL ABSCESS;  Surgeon: Jamesetta So, MD;  Location: [facility];  Service: General;  Laterality: N/A;  . Billroth ii procedure       [place], first 2000, 2005/2006.  . Colonoscopy      in [place]  . Cholecystectomy    . Colonoscopy with propofol N/A 05/20/2013    Dr.Rourk- inadequate prep, normal appearing rectum, grossly normal colon aside from pancolonic diverticula, normal terminal ileum bx= unremarkable colonic mucosa. Due for early interval 2016.   Marland Kitchen Esophagogastroduodenoscopy (egd) with propofol N/A 05/20/2013    Dr.Rourk- s/p prior gastric surgery with normal esophagus, residual gastric mucosa and patent efferent limb  . Esophageal biopsy  05/20/2013    Procedure: BIOPSIES OF ASCENDING AND SIGMOID COLON;  Surgeon: Daneil Dolin, MD;  Location: [facility];  Service: Endoscopy;;  . Wound exploration Right 06/24/2013    Procedure: exploration of traumatic wound right wrist;  Surgeon: Tennis Must, MD;  Location: [facility];  Service: Orthopedics;  Laterality: Right;  . Tendon repar Right     wrist  . Adrenal turmor removal    . Esophagogastroduodenoscopy (egd) with propofol N/A 02/03/2014    Dr. Gala Romney:  s/p hemigastrectomy with  retained gastric contents. Residual gastric mucosa and efferent limb appeared normal otherwise. Query gastroparesis.   . Colonoscopy with propofol N/A 04/26/2014    OLM:BEMLJQ ileum/one colon polyp removed/moderate pan-colonic diverticulosis/large internal hemorrhoids  . Esophagogastroduodenoscopy (egd) with propofol N/A 04/26/2014    GBE:[id number] ring/small HH/mild non-erosive gasrtitis/normal anastomosis  . Esophageal biopsy  04/26/2014    Procedure: BIOPSIES;  Surgeon: Danie Binder, MD;  Location: [facility];  Service: Endoscopy;;   Family History  Problem Relation Age of Onset  . Brain cancer Son   . Schizophrenia Son   . Cancer Son     brain  . Breast cancer Maternal Aunt   . Bipolar disorder Maternal Aunt   . Drug abuse  Maternal Aunt   . Colon cancer Maternal Grandmother     late 75s, early 56s  . Lung cancer Father   . Cancer Father     mets  . Liver disease Neg Hx   . Drug abuse Mother   . Drug abuse Sister   . Drug abuse Brother   . Bipolar disorder Paternal Grandfather   . Bipolar disorder Cousin    History  Substance Use Topics  . Smoking status: Current Every Day Smoker -- 0.25 packs/day for 35 years    Types: Cigarettes  . Smokeless tobacco: Never Used  . Alcohol Use: 7.2 oz/week    12 Cans of beer, 0 Standard drinks or equivalent per week     Comment: ETOH abuse. as of 04/25/14, states drinking 1-2 beers per day   OB History    No data available     Review of Systems  Gastrointestinal: Positive for vomiting.  All other systems reviewed and are negative.     Allergies  Codeine and Morphine and related  Home Medications   Prior to Admission medications   Medication Sig Start Date End Date Taking? Authorizing Provider  ARIPiprazole (ABILIFY) 5 MG tablet Take 1 tablet (5 mg total) by mouth 2 (two) times daily. 07/16/13   Cloria Spring, MD  escitalopram (LEXAPRO) 20 MG tablet Take 1 tablet (20 mg total) by mouth daily. 07/16/13 07/16/14  Cloria Spring, MD  fluticasone (FLONASE) 50 MCG/ACT nasal spray Place 1 spray into both nostrils daily.    Historical Provider, MD  HYDROcodone-acetaminophen (NORCO/VICODIN) 5-325 MG per tablet Take 1 tablet by mouth every 6 (six) hours as needed for severe pain. Patient not taking: Reported on 04/24/2014 04/06/14   Radene Gunning, NP  hydrocortisone (ANUSOL-HC) 2.5 % rectal cream Place 1 application rectally 2 (two) times daily. 05/17/14   Orvil Feil, NP  lisinopril (PRINIVIL,ZESTRIL) 10 MG tablet Take 10 mg by mouth daily.    Historical Provider, MD  metoCLOPramide (REGLAN) 10 MG tablet Take 1 tablet (10 mg total) by mouth 4 (four) times daily -  before meals and at bedtime. 04/06/14   Radene Gunning, NP  mupirocin ointment (BACTROBAN) 2 % Place into the  nose 2 (two) times daily. Patient taking differently: Place 1 application into the nose 2 (two) times daily.  12/13/13   Maryann Mikhail, DO  ondansetron (ZOFRAN) 4 MG tablet Take 1 tablet (4 mg total) by mouth 3 (three) times daily. With meals Patient taking differently: Take 4 mg by mouth 3 (three) times daily as needed for nausea or vomiting. With meals 01/21/14   Orvil Feil, NP  pantoprazole (PROTONIX) 40 MG tablet Take 1 tablet (40 mg total) by mouth 2 (two) times daily before a meal. 01/21/14  Orvil Feil, NP  potassium chloride (K-DUR) 10 MEQ tablet Take 1 tablet (10 mEq total) by mouth 2 (two) times daily. 05/22/14   Kristen N Ward, DO  promethazine (PHENERGAN) 25 MG tablet Take 1 tablet (25 mg total) by mouth every 6 (six) hours as needed for nausea or vomiting. 05/22/14   Kristen N Ward, DO  sucralfate (CARAFATE) 1 G tablet Take 1 g by mouth 4 (four) times daily.    Historical Provider, MD   BP 129/90 mmHg  Pulse 102  Temp(Src) 98.9 F (37.2 C) (Oral)  Resp 20  Ht 5\' 3"  (1.6 m)  Wt 131 lb (59.421 kg)  BMI 23.21 kg/m2  SpO2 100% Physical Exam  Nursing note and vitals reviewed.  49 year old female, resting comfortably and in no acute distress. Vital signs are significant for borderline hypertension and borderline tachycardia. Oxygen saturation is 100%, which is normal. Head is normocephalic and atraumatic. PERRLA, EOMI. Oropharynx is clear. Neck is nontender and supple without adenopathy or JVD. Back is nontender and there is no CVA tenderness. Lungs are clear without rales, wheezes, or rhonchi. Chest is nontender. Heart has regular rate and rhythm without murmur. Abdomen has multiple surgical scars. There is moderate tenderness of the left upper and left lower quadrants without rebound or guarding. There are no no masses or hepatosplenomegaly and peristalsis is hypoactive. Rectal: Normal sphincter tone, small amount of bright red blood present. Extremities have no cyanosis or edema,  full range of motion is present. Skin is warm and dry without rash. Neurologic: Mental status is normal, cranial nerves are intact, there are no motor or sensory deficits.  ED Course  Procedures (including critical care time) Labs Review Results for orders placed or performed during the hospital encounter of 06/14/14  Amylase  Result Value Ref Range   Amylase 50 0 - 105 U/L  Lipase, blood  Result Value Ref Range   Lipase 39 11 - 59 U/L  Comprehensive metabolic panel  Result Value Ref Range   Sodium 137 135 - 145 mmol/L   Potassium 3.4 (L) 3.5 - 5.1 mmol/L   Chloride 102 96 - 112 mmol/L   CO2 21 19 - 32 mmol/L   Glucose, Bld 73 70 - 99 mg/dL   BUN 8 6 - 23 mg/dL   Creatinine, Ser 0.63 0.50 - 1.10 mg/dL   Calcium 8.6 8.4 - 10.5 mg/dL   Total Protein 7.2 6.0 - 8.3 g/dL   Albumin 3.7 3.5 - 5.2 g/dL   AST 58 (H) 0 - 37 U/L   ALT 33 0 - 35 U/L   Alkaline Phosphatase 85 39 - 117 U/L   Total Bilirubin 0.5 0.3 - 1.2 mg/dL   GFR calc non Af Amer >90 >90 mL/min   GFR calc Af Amer >90 >90 mL/min   Anion gap 14 5 - 15  CBC WITH DIFFERENTIAL  Result Value Ref Range   WBC 5.0 4.0 - 10.5 K/uL   RBC 3.53 (L) 3.87 - 5.11 MIL/uL   Hemoglobin 10.9 (L) 12.0 - 15.0 g/dL   HCT 30.6 (L) 36.0 - 46.0 %   MCV 86.7 78.0 - 100.0 fL   MCH 30.9 26.0 - 34.0 pg   MCHC 35.6 30.0 - 36.0 g/dL   RDW 18.6 (H) 11.5 - 15.5 %   Platelets 218 150 - 400 K/uL   Neutrophils Relative % 40 (L) 43 - 77 %   Neutro Abs 2.0 1.7 - 7.7 K/uL   Lymphocytes Relative  44 12 - 46 %   Lymphs Abs 2.2 0.7 - 4.0 K/uL   Monocytes Relative 15 (H) 3 - 12 %   Monocytes Absolute 0.7 0.1 - 1.0 K/uL   Eosinophils Relative 1 0 - 5 %   Eosinophils Absolute 0.1 0.0 - 0.7 K/uL   Basophils Relative 0 0 - 1 %   Basophils Absolute 0.0 0.0 - 0.1 K/uL  Urinalysis with microscopic  Result Value Ref Range   Color, Urine YELLOW YELLOW   APPearance CLEAR CLEAR   Specific Gravity, Urine 1.015 1.005 - 1.030   pH 5.5 5.0 - 8.0   Glucose, UA  NEGATIVE NEGATIVE mg/dL   Hgb urine dipstick NEGATIVE NEGATIVE   Bilirubin Urine NEGATIVE NEGATIVE   Ketones, ur 15 (A) NEGATIVE mg/dL   Protein, ur NEGATIVE NEGATIVE mg/dL   Urobilinogen, UA 0.2 0.0 - 1.0 mg/dL   Nitrite NEGATIVE NEGATIVE   Leukocytes, UA NEGATIVE NEGATIVE  Lactic acid, plasma  Result Value Ref Range   Lactic Acid, Venous 3.5 (HH) 0.5 - 2.0 mmol/L  Lactic acid, plasma  Result Value Ref Range   Lactic Acid, Venous 1.9 0.5 - 2.0 mmol/L  POC occult blood, ED Provider will collect  Result Value Ref Range   Fecal Occult Bld POSITIVE (A) NEGATIVE   Imaging Review Dg Abd Acute W/chest  06/14/2014   CLINICAL DATA:  Acute onset of mid abdominal pain, nausea, vomiting and diarrhea. Rectal bleeding. Initial encounter.  EXAM: ACUTE ABDOMEN SERIES (ABDOMEN 2 VIEW & CHEST 1 VIEW)  COMPARISON:  Chest and abdominal radiographs, and CT of the abdomen and pelvis performed 05/22/2014  FINDINGS: The lungs are well-aerated. Mild peribronchial thickening is noted. There is no evidence of focal opacification, pleural effusion or pneumothorax. The cardiomediastinal silhouette is within normal limits.  The visualized bowel gas pattern is unremarkable. Scattered stool and air are seen within the colon; there is no evidence of small bowel dilatation to suggest obstruction. No free intra-abdominal air is identified on the provided upright view. Postoperative change is noted about the right upper quadrant. A bowel suture line is noted at the left mid abdomen.  No acute osseous abnormalities are seen; the sacroiliac joints are unremarkable in appearance.  IMPRESSION: 1. Unremarkable bowel gas pattern; no free intra-abdominal air seen. Moderate amount of stool noted in the colon. 2. Mild peribronchial thickening noted; lungs otherwise clear.   Electronically Signed   By: Garald Balding M.D.   On: 06/14/2014 02:56    Images viewed by me.   MDM   Final diagnoses:  Abdominal pain, unspecified abdominal  location  Nausea and vomiting, vomiting of unspecified type  Rectal bleeding  Elevated lactic acid level  Normochromic normocytic anemia    Abdominal pain, vomiting, rectal bleeding. Old records reviewed in she had an ED visit with similar complaints about 3 weeks ago. Screening labs are obtained and she'll be given IV fluids as well as ondansetron for nausea. She has had multiple CT scans of abdomen. Will get abdominal series to look for evidence of obstruction.  Abdominal x-rays are nonspecific. Laboratory workup is significant for elevated lactic acid. She was given aggressive IV hydration and repeat lactic acid level is normal. There is no significant drop in hemoglobin to suggest active GI bleeding. Review of past records shows that the recent colonoscopy showed internal hemorrhoids as source of bleeding. She is discharged with prescriptions for promethazine tablets and suppositories as well as a small number of oxycodone-acetaminophen tablets. She is to follow-up  with her gastroenterologist.  Delora Fuel, MD 91/91/66 0600

## 2014-06-14 NOTE — ED Notes (Signed)
Able to drink w/o difficulty, no vomiting or stools while in ED.

## 2014-06-14 NOTE — ED Notes (Signed)
CRITICAL VALUE ALERT  Critical value received: lactic acid 3.5  Date of notification:  06/14/2014  Time of notification:  4158  Critical value read back:Yes.    Nurse who received alert:  Lajoyce Lauber  MD notified (1st page):  Dr. Roxanne Mins  Time of first page:  [phone]

## 2014-06-14 NOTE — ED Notes (Signed)
Pt c/o n/v/d and bleeding from rectum x 3 days

## 2014-06-19 DIAGNOSIS — R112 Nausea with vomiting, unspecified: Secondary | ICD-10-CM | POA: Diagnosis not present

## 2014-06-19 DIAGNOSIS — R1012 Left upper quadrant pain: Secondary | ICD-10-CM | POA: Diagnosis not present

## 2014-06-19 DIAGNOSIS — K625 Hemorrhage of anus and rectum: Secondary | ICD-10-CM | POA: Diagnosis not present

## 2014-06-19 DIAGNOSIS — R1032 Left lower quadrant pain: Secondary | ICD-10-CM | POA: Diagnosis not present

## 2014-06-21 ENCOUNTER — Telehealth: Payer: Self-pay | Admitting: Gastroenterology

## 2014-06-21 NOTE — Telephone Encounter (Signed)
REMINDER IN EPIC °

## 2014-06-21 NOTE — Telephone Encounter (Signed)
Pt aware of results 

## 2014-06-21 NOTE — Telephone Encounter (Signed)
RMR PT 

## 2014-06-21 NOTE — Telephone Encounter (Signed)
Please clarify if this is a SF or RMR patient. I have NIC'ed her next TCS for 5-10 years per SF and SF did her procedure today, but she is also assign to be RMR patient. Let me know if I need to change the GI doctor in epic.

## 2014-06-21 NOTE — Telephone Encounter (Signed)
Please call pt. She had a simple adenoma removed.  Her colon Bx are normal.   FOLLOW A HIGH FIBER DIET. NEXT TCS IN 5-10 YEARS.

## 2014-07-06 ENCOUNTER — Other Ambulatory Visit: Payer: Self-pay | Admitting: Gastroenterology

## 2014-08-24 IMAGING — CT CT ABD-PELV W/ CM
2 of 4 series · 14 of 46 positions shown, 16 images · IV contrast (Omnipaque 300)
Comparison: None.

CLINICAL DATA: History of lupus and thyroid disease with abdominal
pain and swelling and redness inferior and lateral to the umbilicus

EXAM:
CT ABDOMEN AND PELVIS WITH CONTRAST
TECHNIQUE: Multidetector CT imaging of the abdomen and pelvis was performed
using the standard protocol following bolus administration of
intravenous contrast.
CONTRAST:  50mL OMNIPAQUE IOHEXOL 300 MG/ML SOLN, 100mL OMNIPAQUE
IOHEXOL 300 MG/ML SOLN

[Series 2: abd_pel_with 5.0 b40f · axial · 0.62mm/px · z∈[-470,-54]mm · 11 of 93 slices shown, 13 images]
[im 5/93  soft-tissue]
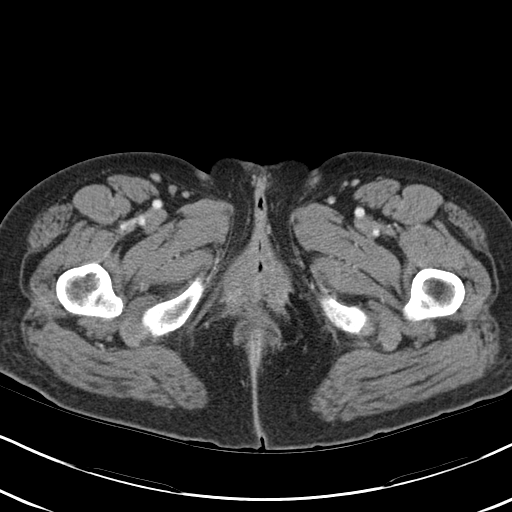
[im 5/93  bone]
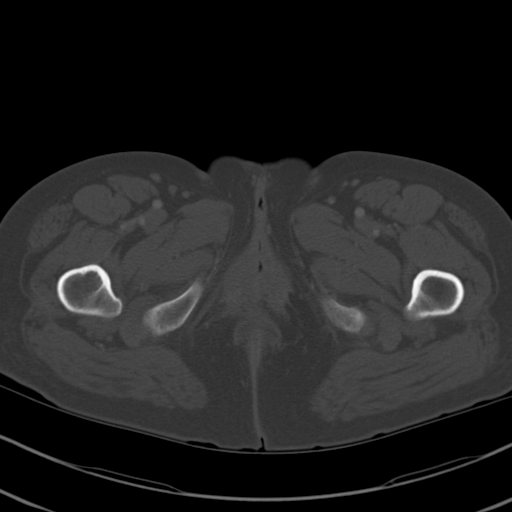
[im 14/93  soft-tissue]
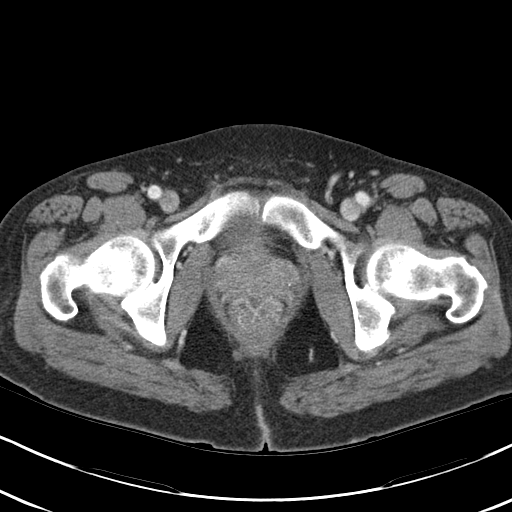
[im 22/93  soft-tissue]
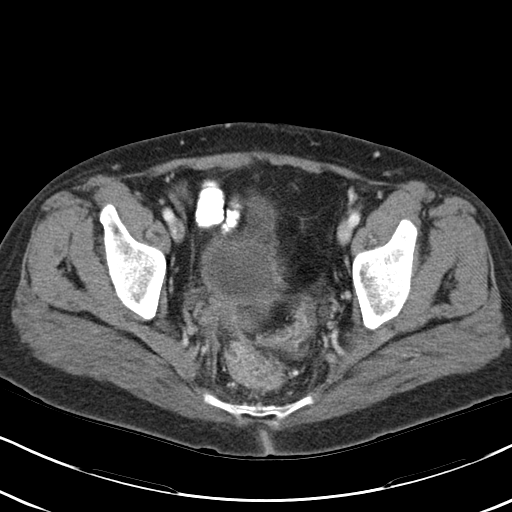
[im 31/93  soft-tissue]
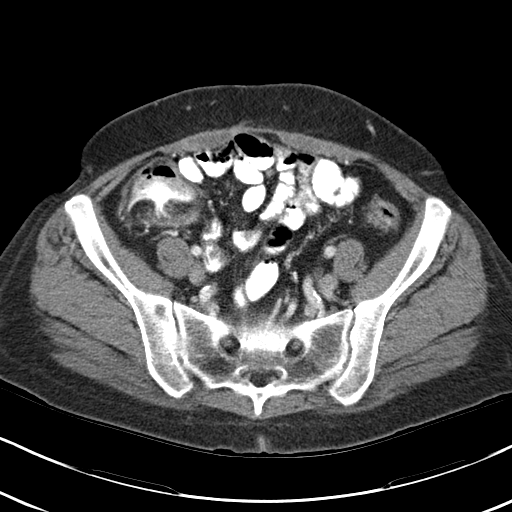
[im 40/93  soft-tissue]
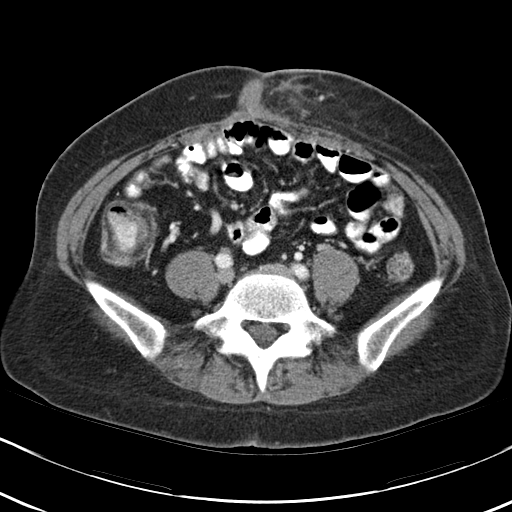
[im 49/93  soft-tissue]
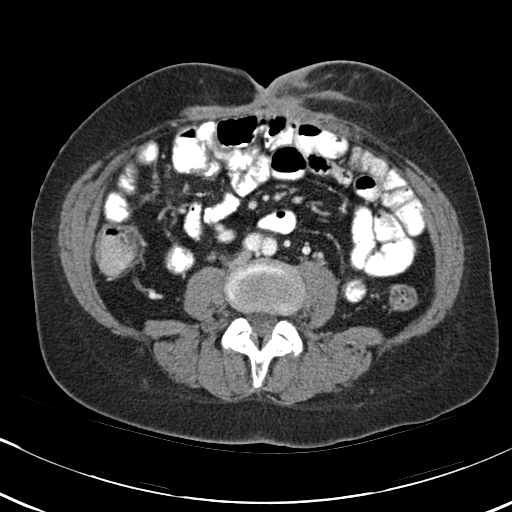
[im 53/93  soft-tissue]
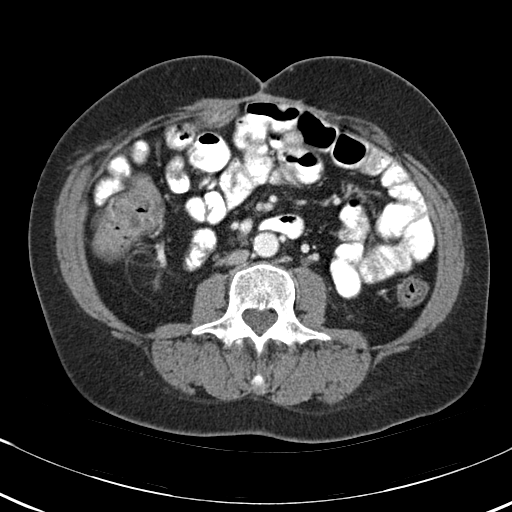
[im 62/93  soft-tissue]
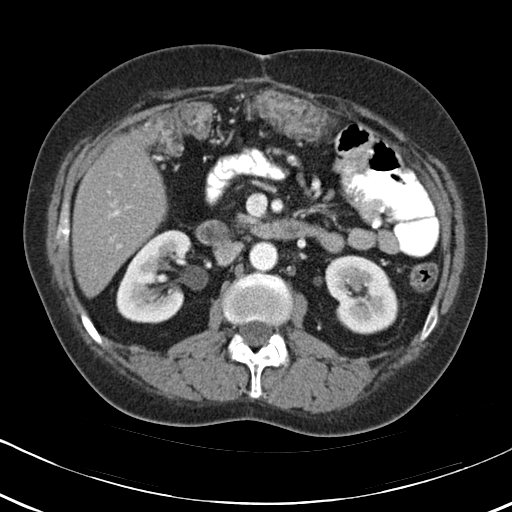
[im 71/93  soft-tissue]
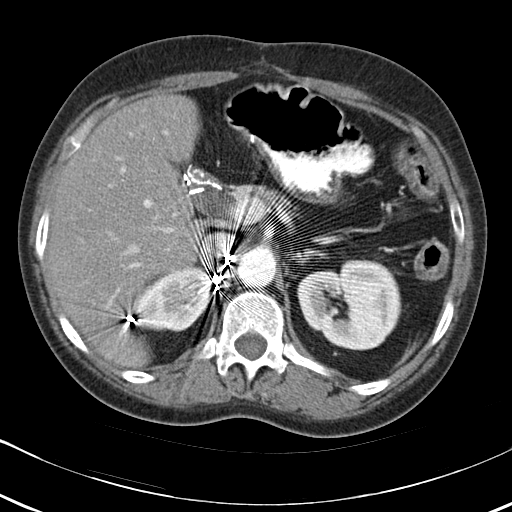
[im 71/93  bone]
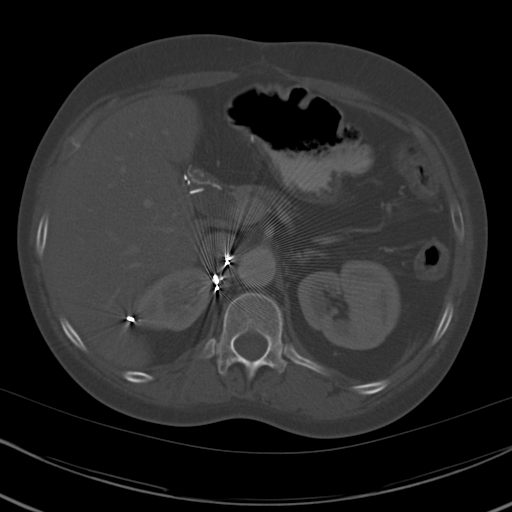
[im 79/93  soft-tissue]
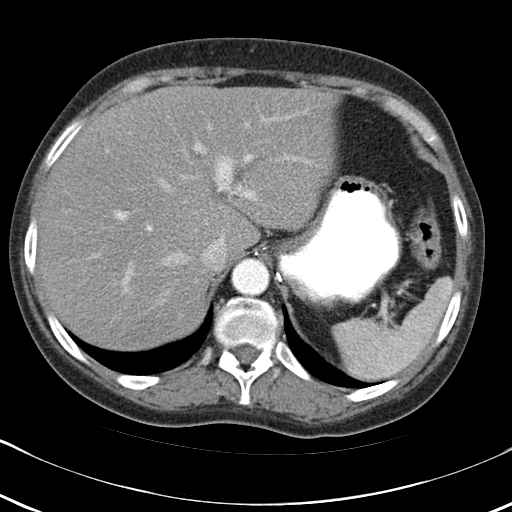
[im 88/93  soft-tissue]
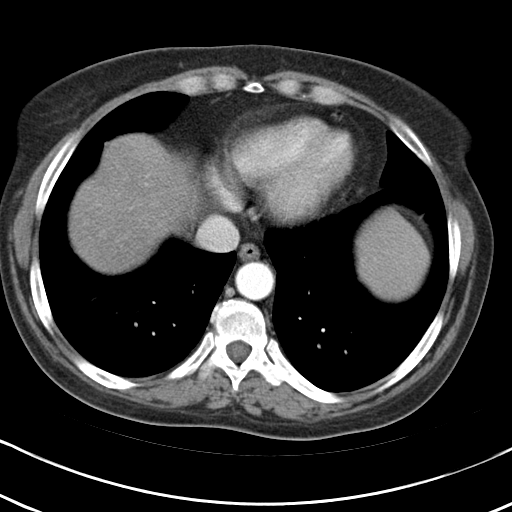

[Series 4: abd_pel_with 3.0 spo cor · coronal · 0.61mm/px · 3 of 78 slices shown]
[im 26/78  soft-tissue]
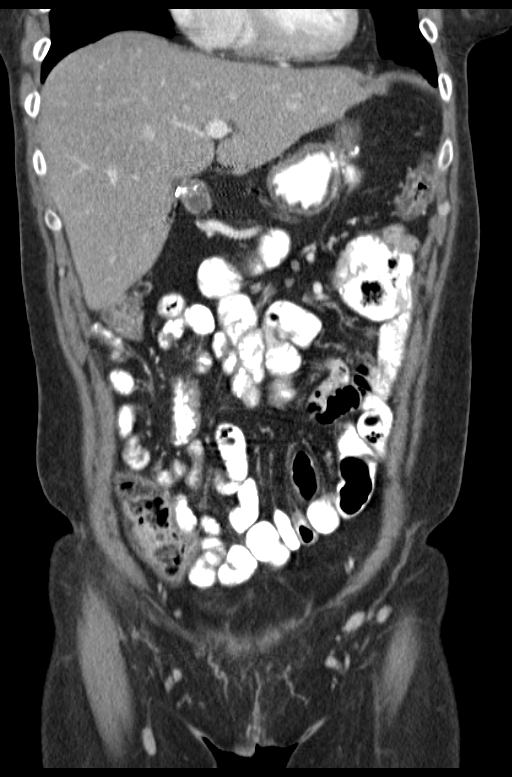
[im 35/78  soft-tissue]
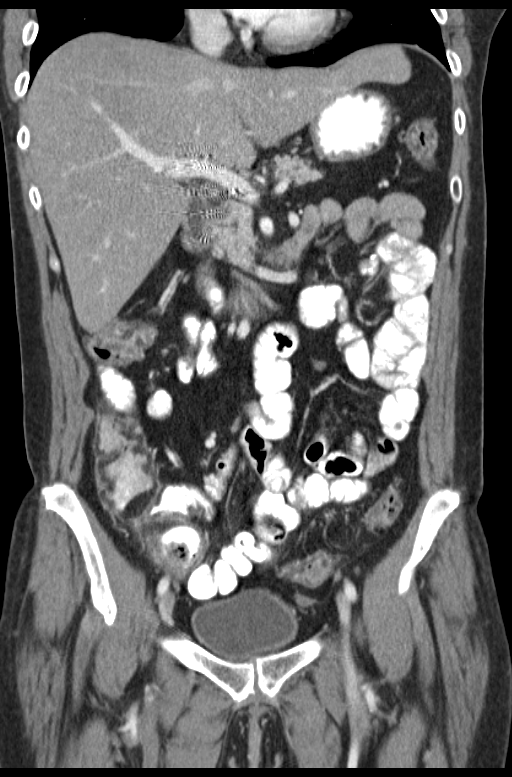
[im 43/78  soft-tissue]
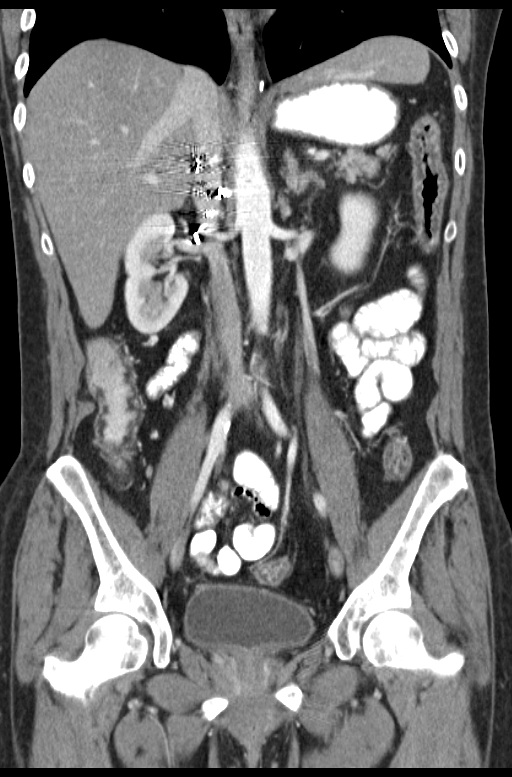

[14 of 46 positions shown; findings below may reference images not displayed]

FINDINGS: Visualized lung bases clear. Mild atelectatic change the left lung
base.

Significant diffuse fatty infiltration of the liver. No focal
hepatic abnormalities. Gallbladder is surgically absent. Spleen is
normal. Pancreas is normal. Mild dilatation of the common bile duct
is likely related to prior cholecystectomy. There is a left adrenal
mass. It measures 24 x 21 mm. It demonstrates an average attenuation
value of 56. Right adrenal gland appears to be surgically absent.
Right kidney is normal. Left kidney is normal. There is mild
dilatation of left ureter as compared to the right but there is no
stone or perinephric inflammatory change.

Bladder is normal. There is wall thickening involving the rectum,
sigmoid colon, and descending colon. The transverse colon and
ascending colon as well as the cecum shows similar up mild to
moderate diffuse wall thickening.

Inferior to the umbilicus, there is a rim enhancing fluid collection
in the left subcutaneous soft tissues, measuring 28 x 25 mm. This
associated with diffuse infraumbilical midline wall thickening and a
band of inflammatory change extending to the anterior abdominal
wall, measuring about 3.8 cm. The entire presumably inflammatory
process measures 84 x 28 mm. There may be a tiny focus of gas chest
inside the anterior abdominal wall to the left of midline at this
level seen on image number 53. Across the abdominal wall, inside the
peritoneal cavity adjacent to this inflammatory process, there are
several loops of otherwise normal appearing small bowel located
anteriorly against the anterior abdominal wall.
IMPRESSION: 1. Moderately severe diffuse colon wall thickening consistent with
diffuse colitis
2. Inflammatory change in the infraumbilical abdominal wall in the
midline and to the left of midline. There appears to be at least 1
abscess cavity towards the left aspect of this process, measuring
2.8 x 2.5 cm. Etiology is uncertain. This process extends down to
the deep fascia of the abdominal wall. Across the abdominal wall
within the adjacent peritoneum, there are numerous loops of
otherwise normal appearing small bowel. There is an equivocal tiny
focus of gas anteriorly within the peritoneum at this level. Bowel
perforation with spread of infection to the anterior abdominal wall
is not excluded. It is also possible this represents a superficial
infection that spreads down to the deep subcutaneous soft tissues
but does not involve bowel. Note that the inflamed: Described above
does not pass under knee the subcutaneous inflammation and it
appears to be a separate process.
3. Indeterminate left adrenal mass. Recommend nonemergent further
characterization with adrenal protocol MRI.

## 2014-09-04 ENCOUNTER — Emergency Department (HOSPITAL_COMMUNITY)
Admission: EM | Admit: 2014-09-04 | Discharge: 2014-09-04 | Disposition: A | Discharge status: Home / Self Care | Payer: Medicare Other | Attending: Emergency Medicine | Admitting: Emergency Medicine

## 2014-09-04 ENCOUNTER — Encounter (HOSPITAL_COMMUNITY): Payer: Self-pay | Admitting: *Deleted

## 2014-09-04 DIAGNOSIS — R1013 Epigastric pain: Secondary | ICD-10-CM

## 2014-09-04 DIAGNOSIS — R1084 Generalized abdominal pain: Secondary | ICD-10-CM | POA: Insufficient documentation

## 2014-09-04 DIAGNOSIS — F329 Major depressive disorder, single episode, unspecified: Secondary | ICD-10-CM | POA: Insufficient documentation

## 2014-09-04 DIAGNOSIS — Z792 Long term (current) use of antibiotics: Secondary | ICD-10-CM | POA: Insufficient documentation

## 2014-09-04 DIAGNOSIS — R112 Nausea with vomiting, unspecified: Secondary | ICD-10-CM

## 2014-09-04 DIAGNOSIS — Z8739 Personal history of other diseases of the musculoskeletal system and connective tissue: Secondary | ICD-10-CM | POA: Insufficient documentation

## 2014-09-04 DIAGNOSIS — Z90710 Acquired absence of both cervix and uterus: Secondary | ICD-10-CM | POA: Insufficient documentation

## 2014-09-04 DIAGNOSIS — Z915 Personal history of self-harm: Secondary | ICD-10-CM | POA: Insufficient documentation

## 2014-09-04 DIAGNOSIS — Z720 Tobacco use: Secondary | ICD-10-CM | POA: Insufficient documentation

## 2014-09-04 DIAGNOSIS — Z9889 Other specified postprocedural states: Secondary | ICD-10-CM | POA: Insufficient documentation

## 2014-09-04 DIAGNOSIS — Z86010 Personal history of colonic polyps: Secondary | ICD-10-CM | POA: Insufficient documentation

## 2014-09-04 DIAGNOSIS — Z862 Personal history of diseases of the blood and blood-forming organs and certain disorders involving the immune mechanism: Secondary | ICD-10-CM | POA: Insufficient documentation

## 2014-09-04 DIAGNOSIS — K219 Gastro-esophageal reflux disease without esophagitis: Secondary | ICD-10-CM | POA: Insufficient documentation

## 2014-09-04 DIAGNOSIS — Z9049 Acquired absence of other specified parts of digestive tract: Secondary | ICD-10-CM | POA: Insufficient documentation

## 2014-09-04 DIAGNOSIS — Z7951 Long term (current) use of inhaled steroids: Secondary | ICD-10-CM | POA: Insufficient documentation

## 2014-09-04 DIAGNOSIS — Z872 Personal history of diseases of the skin and subcutaneous tissue: Secondary | ICD-10-CM | POA: Insufficient documentation

## 2014-09-04 DIAGNOSIS — F419 Anxiety disorder, unspecified: Secondary | ICD-10-CM | POA: Insufficient documentation

## 2014-09-04 DIAGNOSIS — Z79899 Other long term (current) drug therapy: Secondary | ICD-10-CM | POA: Insufficient documentation

## 2014-09-04 DIAGNOSIS — I10 Essential (primary) hypertension: Secondary | ICD-10-CM | POA: Insufficient documentation

## 2014-09-04 LAB — CBC WITH DIFFERENTIAL/PLATELET
Basophils Absolute: 0 10*3/uL (ref 0.0–0.1)
Basophils Relative: 0 % (ref 0–1)
EOS ABS: 0 10*3/uL (ref 0.0–0.7)
EOS PCT: 1 % (ref 0–5)
HCT: 35.8 % — ABNORMAL LOW (ref 36.0–46.0)
Hemoglobin: 12.5 g/dL (ref 12.0–15.0)
Lymphocytes Relative: 28 % (ref 12–46)
Lymphs Abs: 1.6 10*3/uL (ref 0.7–4.0)
MCH: 30.9 pg (ref 26.0–34.0)
MCHC: 34.9 g/dL (ref 30.0–36.0)
MCV: 88.4 fL (ref 78.0–100.0)
MONO ABS: 0.5 10*3/uL (ref 0.1–1.0)
MONOS PCT: 9 % (ref 3–12)
Neutro Abs: 3.5 10*3/uL (ref 1.7–7.7)
Neutrophils Relative %: 62 % (ref 43–77)
Platelets: 259 10*3/uL (ref 150–400)
RBC: 4.05 MIL/uL (ref 3.87–5.11)
RDW: 16.1 % — AB (ref 11.5–15.5)
WBC: 5.6 10*3/uL (ref 4.0–10.5)

## 2014-09-04 LAB — COMPREHENSIVE METABOLIC PANEL
ALT: 25 U/L (ref 14–54)
AST: 55 U/L — AB (ref 15–41)
Albumin: 3.9 g/dL (ref 3.5–5.0)
Alkaline Phosphatase: 76 U/L (ref 38–126)
Anion gap: 12 (ref 5–15)
BUN: 6 mg/dL (ref 6–20)
CO2: 25 mmol/L (ref 22–32)
Calcium: 8.7 mg/dL — ABNORMAL LOW (ref 8.9–10.3)
Chloride: 99 mmol/L — ABNORMAL LOW (ref 101–111)
Creatinine, Ser: 0.64 mg/dL (ref 0.44–1.00)
GFR calc Af Amer: >60 mL/min (ref >60)
GFR calc non Af Amer: >60 mL/min (ref >60)
Glucose, Bld: 100 mg/dL — ABNORMAL HIGH (ref 65–99)
Potassium: 3.5 mmol/L (ref 3.5–5.1)
SODIUM: 136 mmol/L (ref 135–145)
Total Bilirubin: 1.2 mg/dL (ref 0.3–1.2)
Total Protein: 7.6 g/dL (ref 6.5–8.1)

## 2014-09-04 LAB — LIPASE, BLOOD: LIPASE: 20 U/L — AB (ref 22–51)

## 2014-09-04 MED ORDER — ACETAMINOPHEN 160 MG/5ML PO SOLN
15.0000 mg/kg | Freq: Once | ORAL | Status: AC
  Administered 2014-09-04: 905.6 mg via ORAL
  Filled 2014-09-04: qty 40.6

## 2014-09-04 MED ORDER — ACETAMINOPHEN 325 MG PO TABS
650.0000 mg | ORAL_TABLET | Freq: Once | ORAL | Status: DC
Start: 2014-09-04 — End: 2014-09-04

## 2014-09-04 MED ORDER — SODIUM CHLORIDE 0.9 % IV BOLUS (SEPSIS)
1000.0000 mL | Freq: Once | INTRAVENOUS | Status: AC
  Administered 2014-09-04: 1000 mL via INTRAVENOUS

## 2014-09-04 MED ORDER — PANTOPRAZOLE SODIUM 40 MG PO TBEC: 40.0000 mg | DELAYED_RELEASE_TABLET | Freq: Two times a day (BID) | ORAL | Status: DC

## 2014-09-04 MED ORDER — ONDANSETRON HCL 4 MG/2ML IJ SOLN
INTRAMUSCULAR | Status: AC
  Filled 2014-09-04: qty 2

## 2014-09-04 MED ORDER — SODIUM CHLORIDE 0.9 % IV BOLUS (SEPSIS)
500.0000 mL | Freq: Once | INTRAVENOUS | Status: AC
  Administered 2014-09-04: 500 mL via INTRAVENOUS

## 2014-09-04 MED ORDER — ONDANSETRON HCL 4 MG/2ML IJ SOLN
4.0000 mg | Freq: Once | INTRAMUSCULAR | Status: AC
  Administered 2014-09-04: 4 mg via INTRAVENOUS

## 2014-09-04 MED ORDER — ONDANSETRON HCL 8 MG PO TABS: 8.0000 mg | ORAL_TABLET | Freq: Three times a day (TID) | ORAL | Status: DC | PRN

## 2014-09-04 NOTE — ED Notes (Signed)
Pt with abd pain with N/V since last week, denies diarrhea

## 2014-09-04 NOTE — ED Provider Notes (Signed)
CSN: [id number]     Arrival date & time 09/04/14  1653 History   First MD Initiated Contact with Patient 09/04/14 1704     Chief Complaint  Patient presents with  . Abdominal Pain     (Consider location/radiation/quality/duration/timing/severity/associated sxs/prior Treatment) HPI   Angel Price is a 49 y.o. female who presents for evaluation of nausea, vomiting, abdominal pain, anorexia for one week. She was using Zofran, but it wasn't helping that she ran out of it. She denies fever, chills, diarrhea, back pain, weakness or dizziness. This is a recurrent problem, but she has not been troubled by it for several months. She has a chronic draining wound on her abdomen following operative repair of a intestinal problem; for the last year. There are no other known modifying factors.    Past Medical History  Diagnosis Date  . Lupus   . 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 Hypertension   . Abscess     soft tissue  . Suicide attempt   . Blood transfusion without reported diagnosis   . Chronic abdominal pain   . Chronic wound infection of abdomen   . Adrenal mass   . Anxiety   . Depression   . GERD (gastroesophageal reflux disease)   . Sickle cell trait   . Gastroparesis Nov 2015  . Nausea and vomiting     chronic, recurrent  . Alcohol abuse   . History of Billroth II operation   . Lung nodule < 6cm on CT 04/25/2014  . Hemorrhoid     internal large  . Diverticulosis     colonoscopy 04/2014 moderat pan colonic  . Colon polyp     colonoscopy 04/2014  . Gastritis     EGD 05/2014  . Hiatal hernia   . Schatzki's ring     patent per EGD 04/2014  . Lung nodule     CT 02/2014 needs repeat 1 month   Past Surgical History  Procedure Laterality Date  . Abdominal surgery    . Abdominal hysterectomy  2013    [place]  . Debridement of abdominal wall abscess N/A 02/08/2013    Procedure: DEBRIDEMENT OF ABDOMINAL WALL ABSCESS;  Surgeon: Jamesetta So, MD;  Location: [facility];  Service: General;  Laterality: N/A;  . Billroth ii procedure       [place], first 2000, 2005/2006.  . Colonoscopy      in [place]  . Cholecystectomy    . Colonoscopy with propofol N/A 05/20/2013    Dr.Rourk- inadequate prep, normal appearing rectum, grossly normal colon aside from pancolonic diverticula, normal terminal ileum bx= unremarkable colonic mucosa. Due for early interval 2016.   Marland Kitchen Esophagogastroduodenoscopy (egd) with propofol N/A 05/20/2013    Dr.Rourk- s/p prior gastric surgery with normal esophagus, residual gastric mucosa and patent efferent limb  . Esophageal biopsy  05/20/2013    Procedure: BIOPSIES OF ASCENDING AND SIGMOID COLON;  Surgeon: Daneil Dolin, MD;  Location: [facility];  Service: Endoscopy;;  . Wound exploration Right 06/24/2013    Procedure: exploration of traumatic wound right wrist;  Surgeon: Tennis Must, MD;  Location: [place];  Service: Orthopedics;  Laterality: Right;  . Tendon repar Right     wrist  . Adrenal turmor removal    . Esophagogastroduodenoscopy (egd) with propofol N/A 02/03/2014    Dr. Gala Romney:  s/p hemigastrectomy with retained gastric contents. Residual gastric mucosa and efferent limb appeared normal otherwise. Query gastroparesis.   . Colonoscopy  with propofol N/A 04/26/2014    [id number] ileum/one colon polyp removed/moderate pan-colonic diverticulosis/large internal hemorrhoids  . Esophagogastroduodenoscopy (egd) with propofol N/A 04/26/2014    [id number] ring/small HH/mild non-erosive gasrtitis/normal anastomosis  . Esophageal biopsy  04/26/2014    Procedure: BIOPSIES;  Surgeon: Danie Binder, MD;  Location: [facility];  Service: Endoscopy;;   Family History  Problem Relation Age of Onset  . Brain cancer Son   . Schizophrenia Son   . Cancer Son     brain  . Breast cancer Maternal Aunt   . Bipolar disorder Maternal Aunt   . Drug abuse Maternal Aunt   . Colon cancer Maternal Grandmother     late 55s, early 27s  . Lung cancer Father   .  Cancer Father     mets  . Liver disease Neg Hx   . Drug abuse Mother   . Drug abuse Sister   . Drug abuse Brother   . Bipolar disorder Paternal Grandfather   . Bipolar disorder Cousin    History  Substance Use Topics  . Smoking status: Current Every Day Smoker -- 0.25 packs/day for 35 years    Types: Cigarettes  . Smokeless tobacco: Never Used  . Alcohol Use: 7.2 oz/week    12 Cans of beer, 0 Standard drinks or equivalent per week     Comment: ETOH abuse. as of 04/25/14, states drinking 1-2 beers per day   OB History    No data available     Review of Systems  All other systems reviewed and are negative.     Allergies  Codeine and Morphine and related  Home Medications   Prior to Admission medications   Medication Sig Start Date End Date Taking? Authorizing Provider  ARIPiprazole (ABILIFY) 5 MG tablet Take 1 tablet (5 mg total) by mouth 2 (two) times daily. 07/16/13  Yes Cloria Spring, MD  escitalopram (LEXAPRO) 10 MG tablet Take 10 mg by mouth daily.   Yes Historical Provider, MD  fluticasone (FLONASE) 50 MCG/ACT nasal spray Place 1 spray into both nostrils daily.   Yes Historical Provider, MD  hydrocortisone (ANUSOL-HC) 2.5 % rectal cream Place 1 application rectally 2 (two) times daily. 05/17/14  Yes Orvil Feil, NP  ibuprofen (ADVIL,MOTRIN) 200 MG tablet Take 400 mg by mouth every 6 (six) hours as needed.   Yes Historical Provider, MD  lisinopril (PRINIVIL,ZESTRIL) 10 MG tablet Take 10 mg by mouth daily.   Yes Historical Provider, MD  metoCLOPramide (REGLAN) 10 MG tablet Take 1 tablet (10 mg total) by mouth 4 (four) times daily -  before meals and at bedtime. 04/06/14  Yes Lezlie Octave Black, NP  mupirocin ointment (BACTROBAN) 2 % Place into the nose 2 (two) times daily. Patient taking differently: Place 1 application into the nose 2 (two) times daily.  12/13/13  Yes Maryann Mikhail, DO  sucralfate (CARAFATE) 1 G tablet Take 1 g by mouth 4 (four) times daily.   Yes Historical  Provider, MD  ondansetron (ZOFRAN) 8 MG tablet Take 1 tablet (8 mg total) by mouth every 8 (eight) hours as needed for nausea or vomiting. 09/04/14   Daleen Bo, MD  pantoprazole (PROTONIX) 40 MG tablet Take 1 tablet (40 mg total) by mouth 2 (two) times daily before a meal. 09/04/14   Daleen Bo, MD   BP 143/97 mmHg  Pulse 87  Temp(Src) 98.7 F (37.1 C) (Oral)  Resp 20  Ht 5\' 3"  (1.6 m)  Wt 133 lb (60.328  kg)  BMI 23.57 kg/m2  SpO2 100% Physical Exam  Constitutional: She is oriented to person, place, and time. She appears well-developed and well-nourished.  HENT:  Head: Normocephalic and atraumatic.  Right Ear: External ear normal.  Left Ear: External ear normal.  Eyes: Conjunctivae and EOM are normal. Pupils are equal, round, and reactive to light.  Neck: Normal range of motion and phonation normal. Neck supple.  Cardiovascular: Normal rate, regular rhythm and normal heart sounds.   Pulmonary/Chest: Effort normal and breath sounds normal. No respiratory distress. She exhibits no bony tenderness.  Abdominal: Soft. Bowel sounds are normal. There is tenderness (diffuse, mild). There is no rebound and no guarding.  Draining wound, left lower quadrant, small amount of green mucus on a gauze pad, which is on her abdominal wall.  Musculoskeletal: Normal range of motion.  Neurological: She is alert and oriented to person, place, and time. No cranial nerve deficit or sensory deficit. She exhibits normal muscle tone. Coordination normal.  Skin: Skin is warm, dry and intact.  Psychiatric: She has a normal mood and affect. Her behavior is normal. Judgment and thought content normal.  Nursing note and vitals reviewed.   ED Course  Procedures (including critical care time)  Medications  acetaminophen (TYLENOL) tablet 650 mg (650 mg Oral Not Given 09/04/14 1957)  sodium chloride 0.9 % bolus 500 mL (0 mLs Intravenous Stopped 09/04/14 1817)  sodium chloride 0.9 % bolus 1,000 mL (0 mLs  Intravenous Stopped 09/04/14 1957)  acetaminophen (TYLENOL) solution 905.6 mg (905.6 mg Oral Given 09/04/14 1859)  ondansetron (ZOFRAN) injection 4 mg (4 mg Intravenous Given 09/04/14 1909)    Patient Vitals for the past 24 hrs:  BP Temp Temp src Pulse Resp SpO2 Height Weight  09/04/14 2005 - - - - 20 - - -  09/04/14 2000 143/97 mmHg - - 87 - 100 % - -  09/04/14 1945 - - - 91 - 97 % - -  09/04/14 1930 141/92 mmHg - - - - - - -  09/04/14 1915 - - - 96 - 96 % - -  09/04/14 1900 (!) 136/105 mmHg - - 91 - 98 % - -  09/04/14 1845 - - - 102 - 96 % - -  09/04/14 1830 (!) 144/103 mmHg - - 90 - 98 % - -  09/04/14 1817 (!) 144/107 mmHg - - 91 20 97 % - -  09/04/14 1656 (!) 168/118 mmHg 98.7 F (37.1 C) Oral 115 20 98 % 5\' 3"  (1.6 m) 133 lb (60.328 kg)    At discharge- Reevaluation with update and discussion. After initial assessment and treatment, an updated evaluation reveals she is feeling better. She has tolerated some oral liquids. Findings discussed with patient, all questions answered.Daleen Bo L     Labs Review Labs Reviewed  COMPREHENSIVE METABOLIC PANEL - Abnormal; Notable for the following:    Chloride 99 (*)    Glucose, Bld 100 (*)    Calcium 8.7 (*)    AST 55 (*)    All other components within normal limits  LIPASE, BLOOD - Abnormal; Notable for the following:    Lipase 20 (*)    All other components within normal limits  CBC WITH DIFFERENTIAL/PLATELET - Abnormal; Notable for the following:    HCT 35.8 (*)    RDW 16.1 (*)    All other components within normal limits    Imaging Review No results found.   EKG Interpretation None      MDM  Final diagnoses:  Nausea and vomiting, vomiting of unspecified type  Epigastric pain    Nonspecific upper extremity pain, with nausea and vomiting. Doubt pancreatitis, serious bacterial infection or metabolic instability.  Nursing Notes Reviewed/ Care Coordinated Applicable Imaging Reviewed Interpretation of Laboratory  Data incorporated into ED treatment  The patient appears reasonably screened and/or stabilized for discharge and I doubt any other medical condition or other [facility] requiring further screening, evaluation, or treatment in the ED at this time prior to discharge.  Plan: Home Medications- Zofran, Protonix; Home Treatments- rest, fluids; return here if the recommended treatment, does not improve the symptoms; Recommended follow up- PCP prn     Daleen Bo, MD 09/04/14 2033

## 2014-09-04 NOTE — Discharge Instructions (Signed)
Abdominal Pain °Many things can cause abdominal pain. Usually, abdominal pain is not caused by a disease and will improve without treatment. It can often be observed and treated at home. Your health care provider will do a physical exam and possibly order blood tests and X-rays to help determine the seriousness of your pain. However, in many cases, more time must pass before a clear cause of the pain can be found. Before that point, your health care provider may not know if you need more testing or further treatment. °HOME CARE INSTRUCTIONS  °Monitor your abdominal pain for any changes. The following actions may help to alleviate any discomfort you are experiencing: °· Only take over-the-counter or prescription medicines as directed by your health care provider. °· Do not take laxatives unless directed to do so by your health care provider. °· Try a clear liquid diet (broth, tea, or water) as directed by your health care provider. Slowly move to a bland diet as tolerated. °SEEK MEDICAL CARE IF: °· You have unexplained abdominal pain. °· You have abdominal pain associated with nausea or diarrhea. °· You have pain when you urinate or have a bowel movement. °· You experience abdominal pain that wakes you in the night. °· You have abdominal pain that is worsened or improved by eating food. °· You have abdominal pain that is worsened with eating fatty foods. °· You have a fever. °SEEK IMMEDIATE MEDICAL CARE IF:  °· Your pain does not go away within 2 hours. °· You keep throwing up (vomiting). °· Your pain is felt only in portions of the abdomen, such as the right side or the left lower portion of the abdomen. °· You pass bloody or black tarry stools. °MAKE SURE YOU: °· Understand these instructions.   °· Will watch your condition.   °· Will get help right away if you are not doing well or get worse.   °Document Released: 12/12/2004 Document Revised: 03/09/2013 Document Reviewed: 11/11/2012 °[employer]® Patient Information  ©2015 [employer]. This information is not intended to replace advice given to you by your health care provider. Make sure you discuss any questions you have with your health care provider. ° °Nausea and Vomiting °Nausea is a sick feeling that often comes before throwing up (vomiting). Vomiting is a reflex where stomach contents come out of your mouth. Vomiting can cause severe loss of body fluids (dehydration). Children and elderly adults can become dehydrated quickly, especially if they also have diarrhea. Nausea and vomiting are symptoms of a condition or disease. It is important to find the cause of your symptoms. °CAUSES  °· Direct irritation of the stomach lining. This irritation can result from increased acid production (gastroesophageal reflux disease), infection, food poisoning, taking certain medicines (such as nonsteroidal anti-inflammatory drugs), alcohol use, or tobacco use. °· Signals from the brain. These signals could be caused by a headache, heat exposure, an inner ear disturbance, increased pressure in the brain from injury, infection, a tumor, or a concussion, pain, emotional stimulus, or metabolic problems. °· An obstruction in the gastrointestinal tract (bowel obstruction). °· Illnesses such as diabetes, hepatitis, gallbladder problems, appendicitis, kidney problems, cancer, sepsis, atypical symptoms of a heart attack, or eating disorders. °· Medical treatments such as chemotherapy and radiation. °· Receiving medicine that makes you sleep (general anesthetic) during surgery. °DIAGNOSIS °Your caregiver may ask for tests to be done if the problems do not improve after a few days. Tests may also be done if symptoms are severe or if the reason for the nausea   and vomiting is not clear. Tests may include: °· Urine tests. °· Blood tests. °· Stool tests. °· Cultures (to look for evidence of infection). °· X-rays or other imaging studies. °Test results can help your caregiver make decisions about  treatment or the need for additional tests. °TREATMENT °You need to stay well hydrated. Drink frequently but in small amounts. You may wish to drink water, sports drinks, clear broth, or eat frozen ice pops or gelatin dessert to help stay hydrated. When you eat, eating slowly may help prevent nausea. There are also some antinausea medicines that may help prevent nausea. °HOME CARE INSTRUCTIONS  °· Take all medicine as directed by your caregiver. °· If you do not have an appetite, do not force yourself to eat. However, you must continue to drink fluids. °· If you have an appetite, eat a normal diet unless your caregiver tells you differently. °¨ Eat a variety of complex carbohydrates (rice, wheat, potatoes, bread), lean meats, yogurt, fruits, and vegetables. °¨ Avoid high-fat foods because they are more difficult to digest. °· Drink enough water and fluids to keep your urine clear or pale yellow. °· If you are dehydrated, ask your caregiver for specific rehydration instructions. Signs of dehydration may include: °¨ Severe thirst. °¨ Dry lips and mouth. °¨ Dizziness. °¨ Dark urine. °¨ Decreasing urine frequency and amount. °¨ Confusion. °¨ Rapid breathing or pulse. °SEEK IMMEDIATE MEDICAL CARE IF:  °· You have blood or brown flecks (like coffee grounds) in your vomit. °· You have black or bloody stools. °· You have a severe headache or stiff neck. °· You are confused. °· You have severe abdominal pain. °· You have chest pain or trouble breathing. °· You do not urinate at least once every 8 hours. °· You develop cold or clammy skin. °· You continue to vomit for longer than 24 to 48 hours. °· You have a fever. °MAKE SURE YOU:  °· Understand these instructions. °· Will watch your condition. °· Will get help right away if you are not doing well or get worse. °Document Released: 03/04/2005 Document Revised: 05/27/2011 Document Reviewed: 08/01/2010 °Exit[employer]® Patient Information ©2015 [employer]. This information is not  intended to replace advice given to you by your health care provider. Make sure you discuss any questions you have with your health care provider. ° °

## 2014-10-09 IMAGING — MR MR ABDOMEN WO/W CM MRCP
19 of 21 series · 42 of 48 positions shown · IV contrast (multihance)
Comparison: CT of the abdomen and pelvis 05/04/2013.

CLINICAL DATA: Biliary tract dilatation.

EXAM:
MRI ABDOMEN WITHOUT AND WITH CONTRAST (INCLUDING MRCP)
TECHNIQUE: Multiplanar multisequence MR imaging of the abdomen was performed
both before and after the administration of intravenous contrast.
Heavily T2-weighted images of the biliary and pancreatic ducts were
obtained, and three-dimensional MRCP images were rendered by post
processing.
CONTRAST:  13mL MULTIHANCE GADOBENATE DIMEGLUMINE 529 MG/ML IV SOLN

[Series 3: t2fs axial blade · axial · 4.0mm · 1.01mm/px · 1 of 48 slices shown]
[im 1/48]
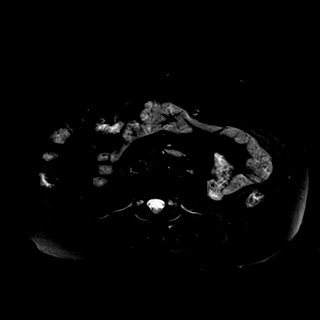

[Series 4: MRCP · coronal · 4.0mm · 0.72mm/px · 1 of 21 slices shown (1 of 2)]
[im 1/21]
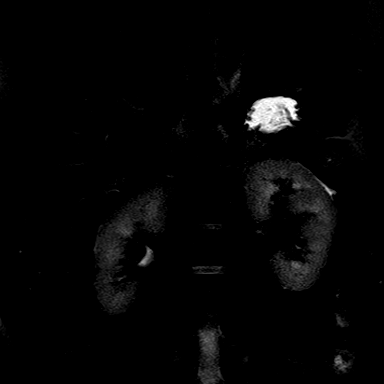

[Series 8: DWI · axial · 5.0mm · 1.64mm/px · z∈[-68,+166]mm · 3 of 120 slices shown (1 of 2)]
[im 1/120]
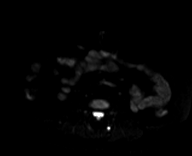
[im 60/120]
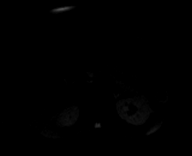
[im 120/120]
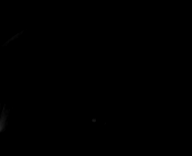

[Series 9: DWI · axial · 5.0mm · 1.74mm/px · 1 of 40 slices shown (2 of 2)]
[im 1/40]
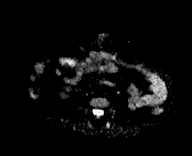

[Series 10: MRCP · coronal · 1.6mm · 0.67mm/px · 1 of 64 slices shown (2 of 2)]
[im 1/64]
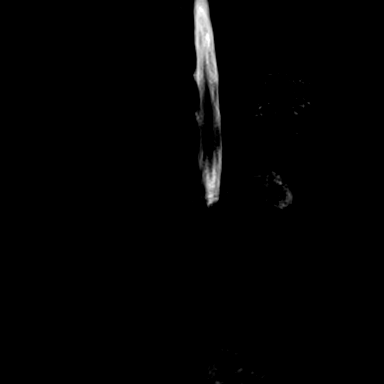

[Series 13: bSSFP · axial · 4.0mm · 1.19mm/px · z∈[-89,+187]mm · 3 of 70 slices shown]
[im 1/70]
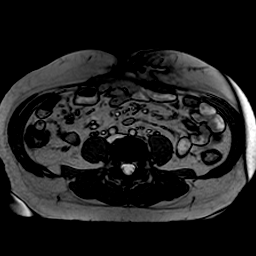
[im 35/70]
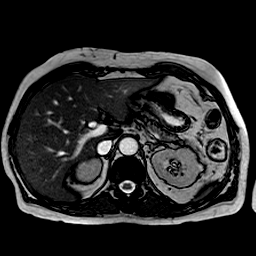
[im 70/70]
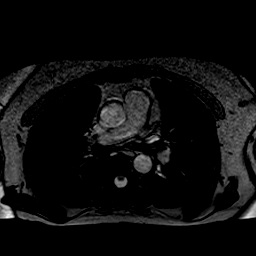

[Series 14: T1 · axial · 4.0mm · 0.49mm/px · z∈[-46,+174]mm · 2 of 56 slices shown (1 of 3)]
[im 1/56]
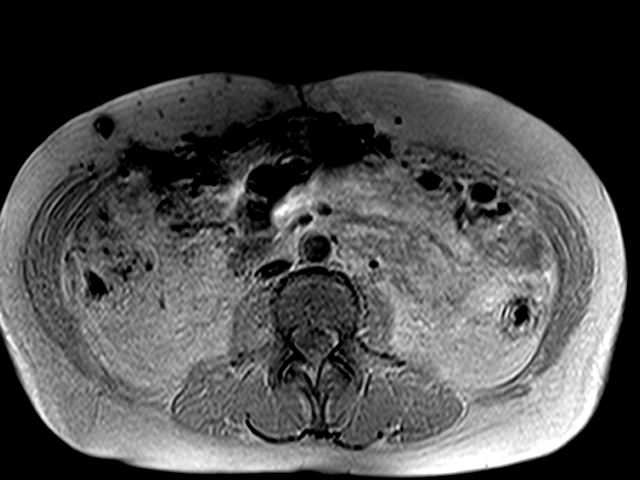
[im 56/56]
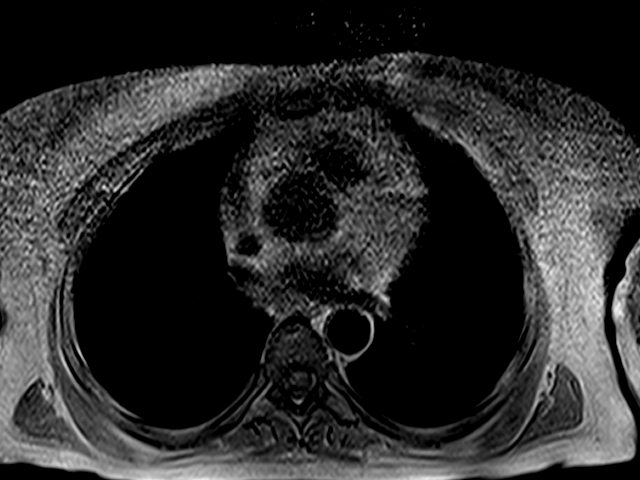

[Series 15: T1 · axial · 4.0mm · 0.49mm/px · z∈[-46,+174]mm · 2 of 56 slices shown (2 of 3)]
[im 1/56]
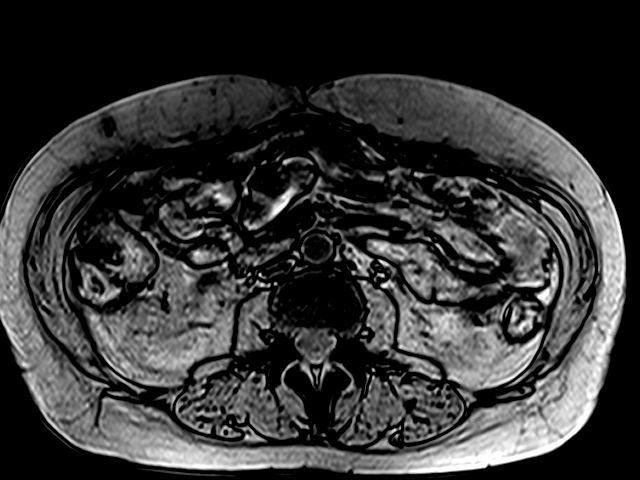
[im 56/56]
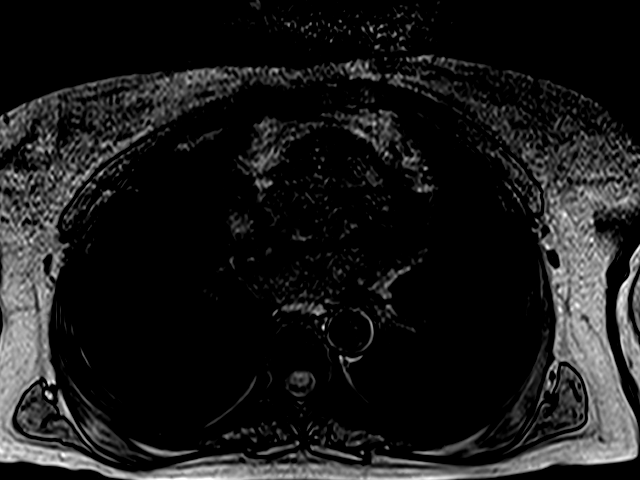

[Series 17: T1 · axial · 4.0mm · 0.49mm/px · z∈[-46,+174]mm · 2 of 56 slices shown (3 of 3)]
[im 1/56]
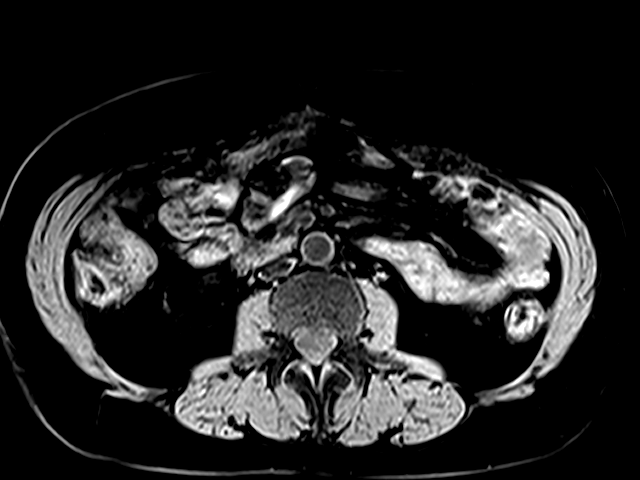
[im 56/56]
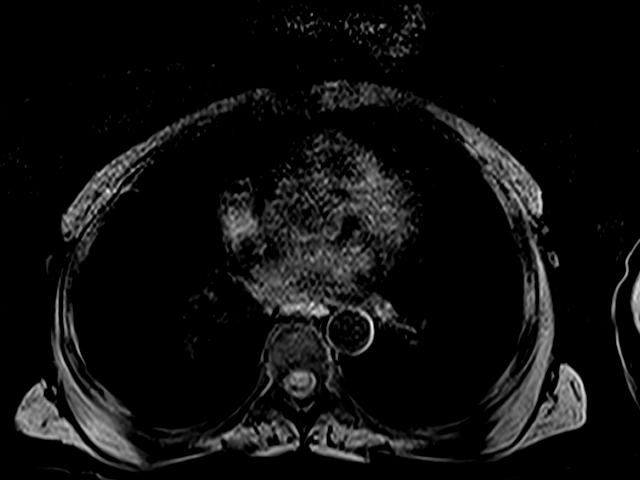

[Series 18: T2 · coronal · 5.0mm · 1.02mm/px · 1 of 36 slices shown (1 of 2)]
[im 1/36]
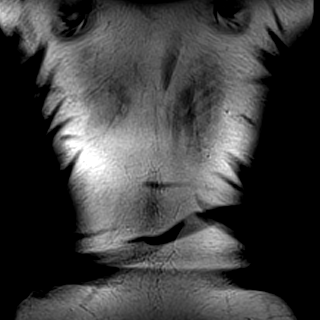

[Series 27: t1fs coronal post · coronal · 2.5mm · 0.98mm/px · 3 of 80 slices shown]
[im 1/80]
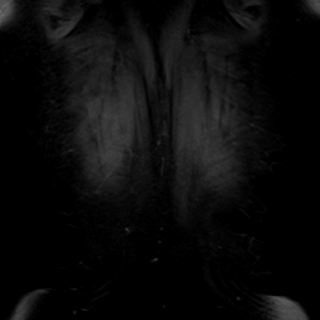
[im 40/80]
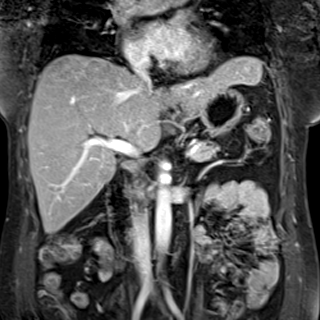
[im 80/80]
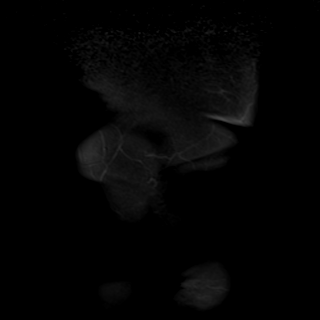

[Series 28: T2 · axial · 5.0mm · 1.23mm/px · 1 of 40 slices shown (2 of 2)]
[im 1/40]
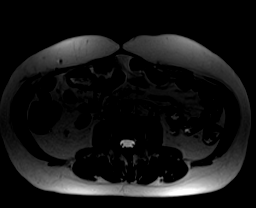

[Series 29: 5 min · axial · 3.5mm · 0.98mm/px · z∈[-60,+189]mm · 3 of 72 slices shown]
[im 1/72]
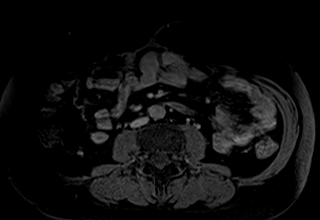
[im 36/72]
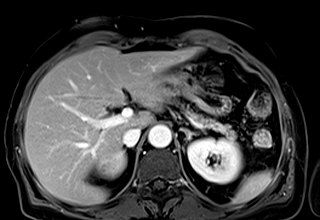
[im 72/72]
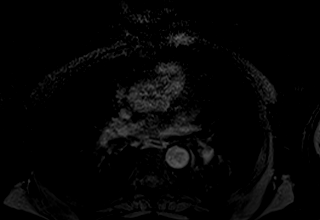

[Series 30: 5 min_sub · axial · 3.5mm · 0.98mm/px · z∈[-60,+189]mm · 3 of 72 slices shown]
[im 1/72]
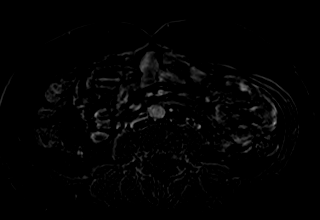
[im 36/72]
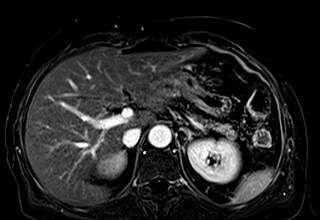
[im 72/72]
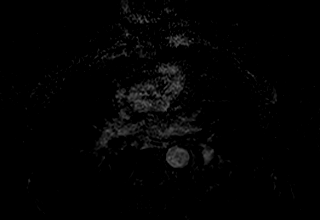

[Series 5004: pre · axial · non-contrast · 3.5mm · 0.98mm/px · z∈[-60,+189]mm · 3 of 72 slices shown]
[im 1/72]
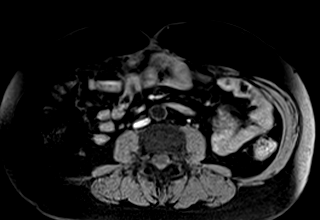
[im 36/72]
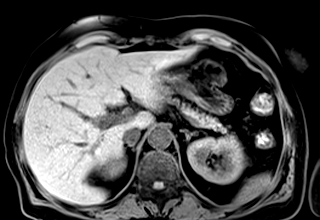
[im 72/72]
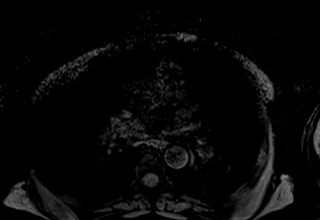

[Series 5005: (id) · axial · 3.5mm · 0.98mm/px · z∈[-60,+189]mm · 3 of 72 slices shown (1 of 2)]
[im 1/72]
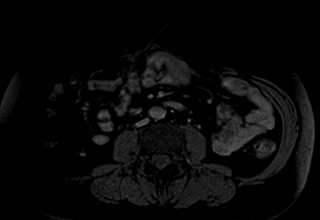
[im 36/72]
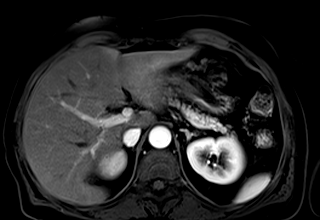
[im 72/72]
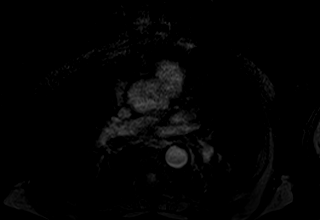

[Series 5006: 23 sub · axial · 3.5mm · 0.98mm/px · z∈[-60,+189]mm · 3 of 72 slices shown]
[im 1/72]
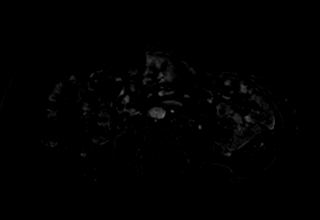
[im 36/72]
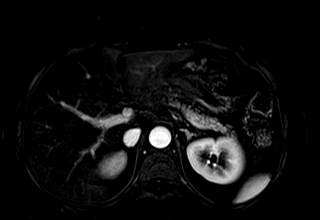
[im 72/72]
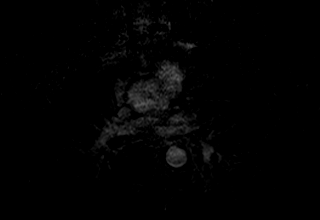

[Series 5007: (id) · axial · 3.5mm · 0.98mm/px · z∈[-60,+189]mm · 3 of 72 slices shown (2 of 2)]
[im 1/72]
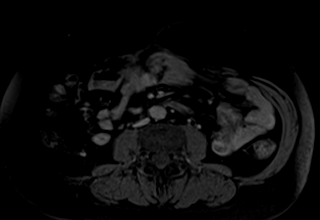
[im 36/72]
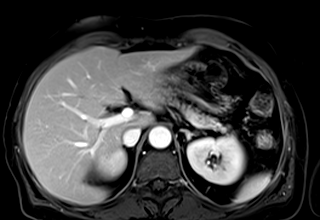
[im 72/72]
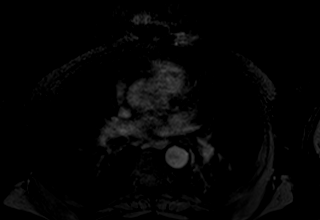

[Series 5008: 45 sub · axial · 3.5mm · 0.98mm/px · z∈[-60,+189]mm · 3 of 72 slices shown]
[im 1/72]
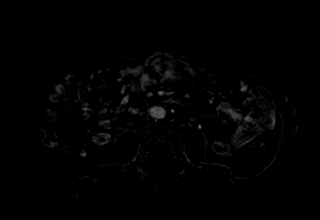
[im 36/72]
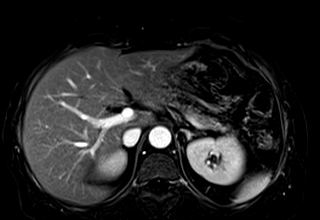
[im 72/72]
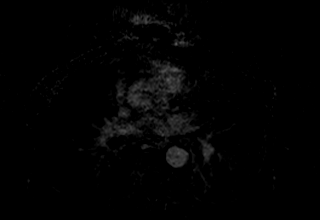

[42 of 48 positions shown; findings below may reference images not displayed]

FINDINGS: MRCP images are slightly suboptimal related to gross patient motion.
With these limitations in mind, these images demonstrate minimal
intrahepatic biliary ductal dilatation. The common hepatic duct
measures up to 9 mm. The cystic duct remnant is also slightly
prominent measuring up to 9 mm. The common bile duct also measures 9
mm. The distal common bile duct abruptly tapers immediately before
the level of the ampulla. No filling defects within the duct

1.8 x 1.5 cm left adrenal nodule demonstrates profound loss of
signal intensity throughout the nodule on out of phase dual echo
images, compatible with high intracellular lipid in the setting of
an adenoma.

Status post cholecystectomy. Mild diffuse loss of signal intensity
throughout the hepatic parenchyma on out of phase dual echo images,
indicative of very mild hepatic steatosis. No focal hepatic lesions
are noted. The appearance of the pancreas, spleen and bilateral
kidneys is unremarkable. Specifically, no pancreatic ductal
dilatation. Extensive susceptibility artifact noted throughout the
upper abdomen in the region of the gastroesophageal junction, right
upper retroperitoneum (related to prior right adrenalectomy), and
adjacent to the stomach, related to clips from prior surgeries.
IMPRESSION: 1. Very mild intra and extrahepatic biliary ductal dilatation, as
above. This appears very similar to the prior CT scan 05/04/2013,
although today's study clearly demonstrates that the perceived
ductal enlargement in the porta hepatis was actually a combination
of the adjacent common hepatic duct and cystic duct remnants rather
than one large common bile duct as it appeared to be on the prior CT
scan. The common hepatic duct, cystic duct remnant and common bile
duct are all mildly dilated measuring up to 9 mm, and there is very
mild intrahepatic biliary ductal dilatation. Although much of this
could be attributable to functional dilatation in this post
cholecystectomy patient, the common bile duct does abruptly taper
immediately before the level of the ampulla such that a distal CBD
stricture is not excluded. If there is clinical concern for biliary
obstruction, further evaluation with ERCP may be warranted.
2. 1.8 x 1.5 cm left adrenal adenoma.
3. Mild hepatic steatosis.
4. Additional incidental findings, as above.

## 2014-10-12 IMAGING — CT CT ABD-PELV W/ CM
2 of 4 series · 14 of 46 positions shown, 16 images · IV contrast (Omnipaque 300)
Comparison: CT of the abdomen and pelvis 05/04/2013.

CLINICAL DATA: Abdominal pain.

EXAM:
CT ABDOMEN AND PELVIS WITH CONTRAST
TECHNIQUE: Multidetector CT imaging of the abdomen and pelvis was performed
using the standard protocol following bolus administration of
intravenous contrast.
CONTRAST:  100mL OMNIPAQUE IOHEXOL 300 MG/ML  SOLN

[Series 2: abd_pel_with 5.0 b40f · axial · 0.59mm/px · z∈[+520,+930]mm · 11 of 92 slices shown, 13 images]
[im 5/92  soft-tissue]
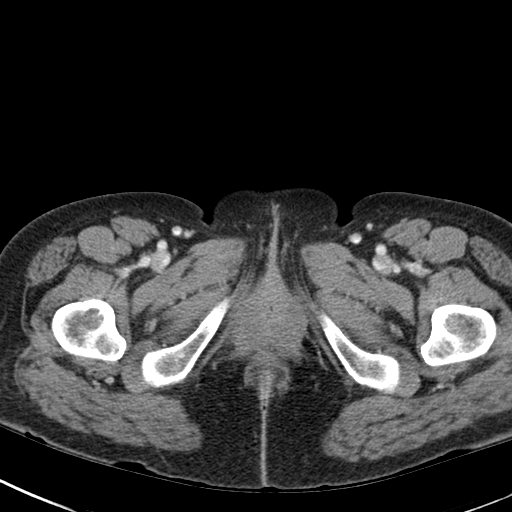
[im 5/92  bone]
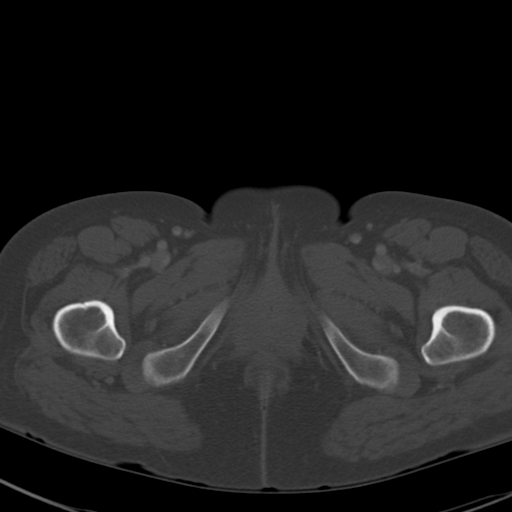
[im 14/92  soft-tissue]
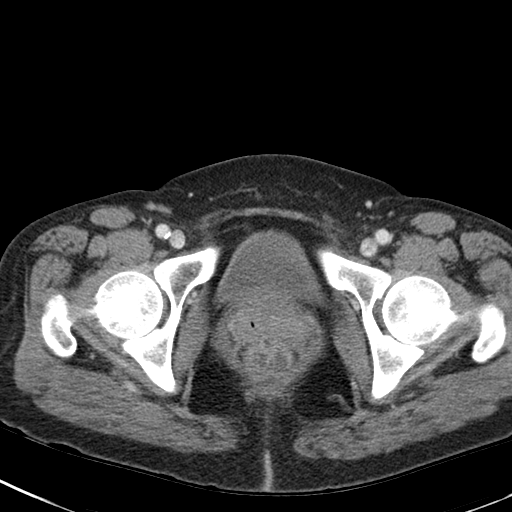
[im 23/92  soft-tissue]
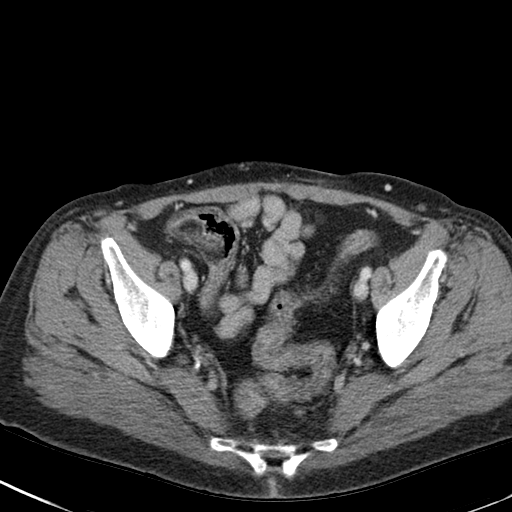
[im 32/92  soft-tissue]
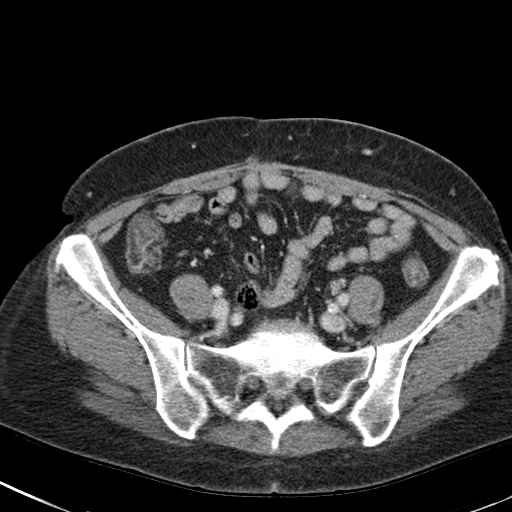
[im 37/92  soft-tissue]
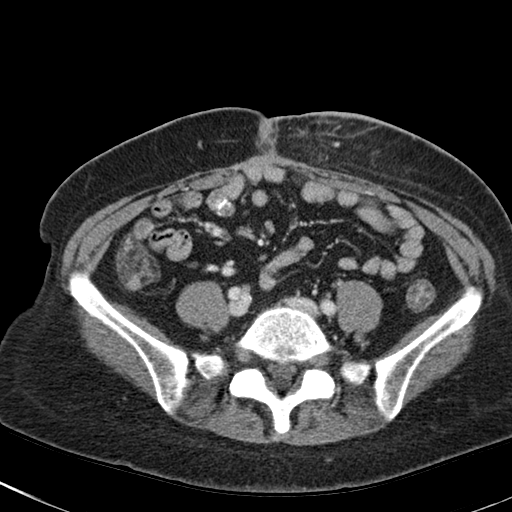
[im 46/92  soft-tissue]
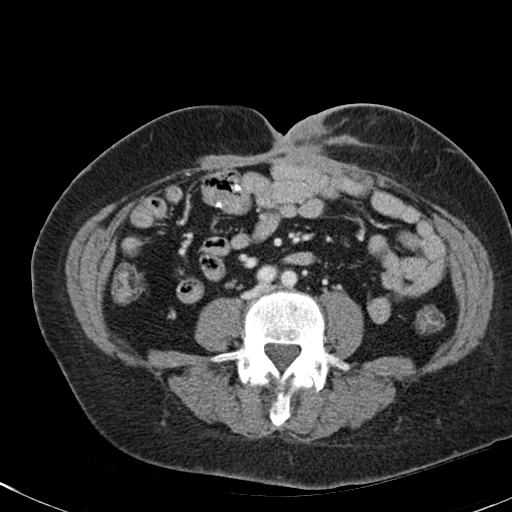
[im 55/92  soft-tissue]
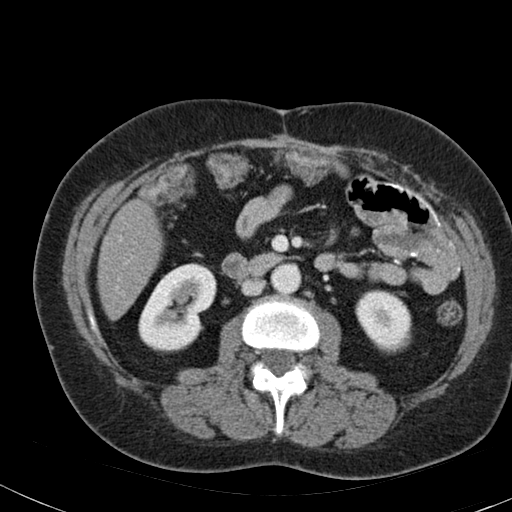
[im 60/92  soft-tissue]
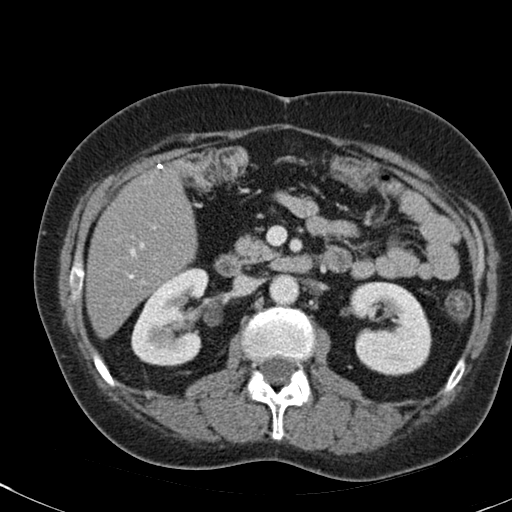
[im 69/92  soft-tissue]
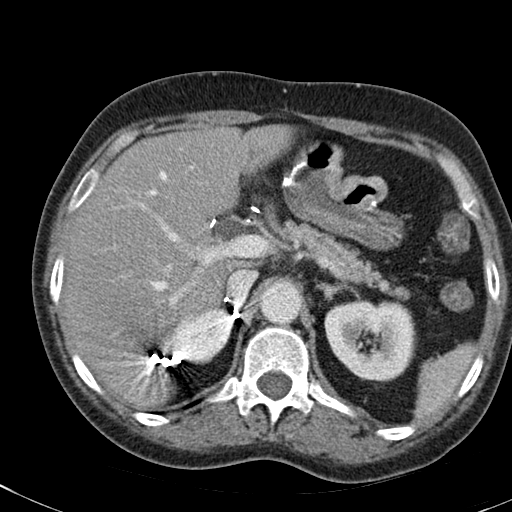
[im 69/92  bone]
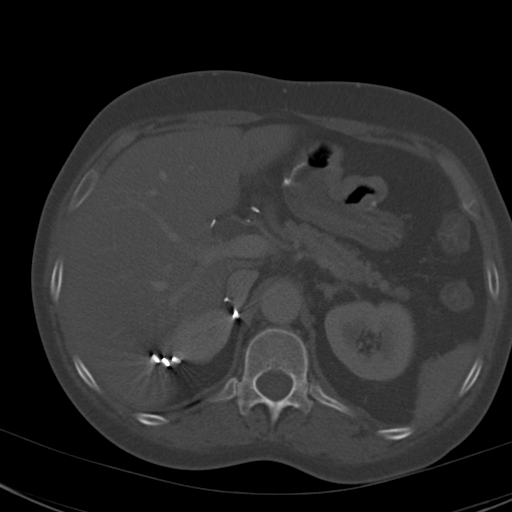
[im 78/92  soft-tissue]
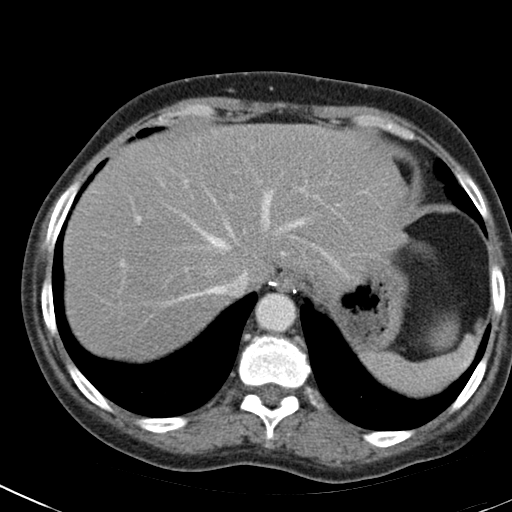
[im 87/92  soft-tissue]
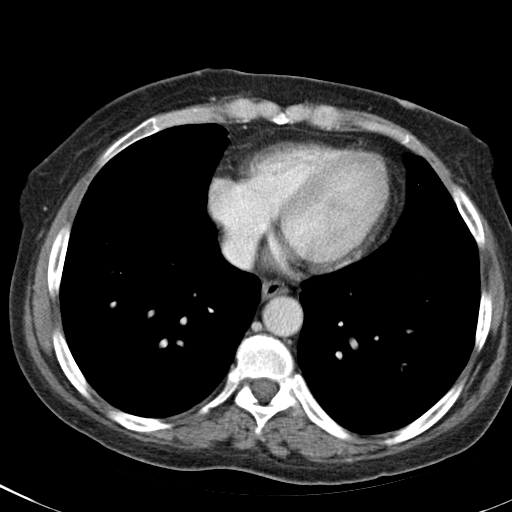

[Series 4: abd_pel_with 3.0 spo cor · coronal · 0.60mm/px · 3 of 86 slices shown]
[im 29/86  soft-tissue]
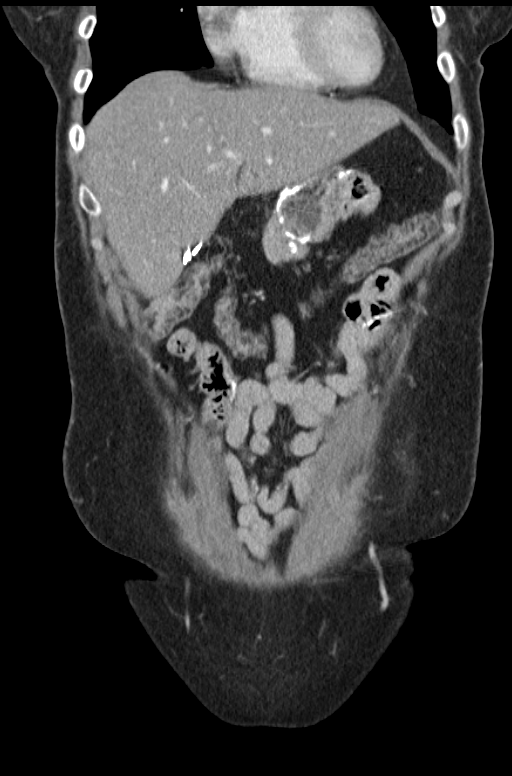
[im 38/86  soft-tissue]
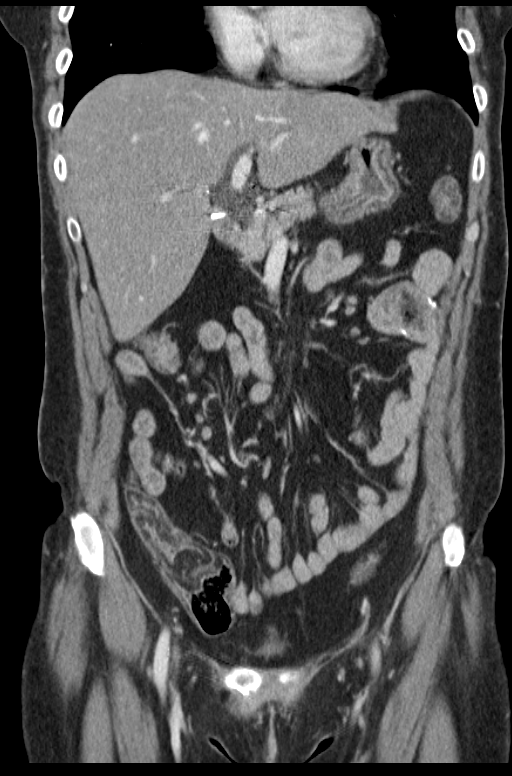
[im 48/86  soft-tissue]
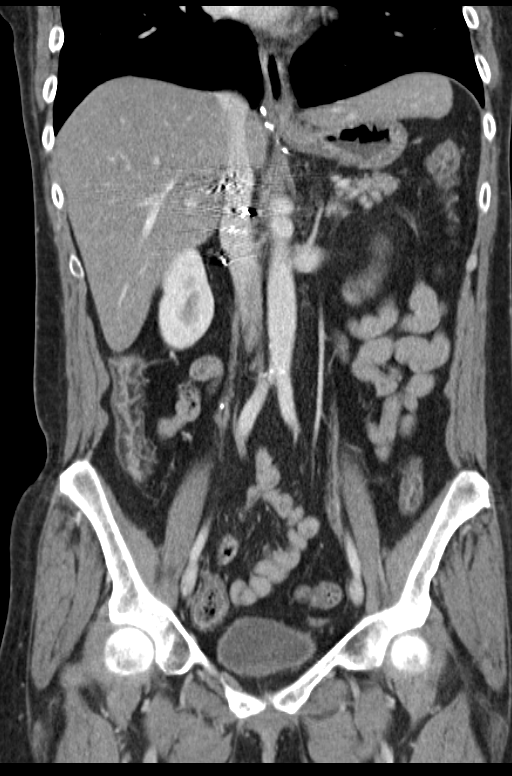

[14 of 46 positions shown; findings below may reference images not displayed]

FINDINGS: Lung Bases: Unremarkable.

Abdomen/Pelvis: Status post cholecystectomy, gastric bypass
procedure and hysterectomy. Numerous surgical clips are noted
superior and medial to the right kidney, potentially from prior
right adrenalectomy. 2 cm left adrenal nodule is incompletely
characterized, but unchanged compared to prior examination dating
back to 04/18/2013 (and recently characterized as an adenoma on MRI
06/03/2013). Common bile duct appears slightly dilated measuring 9
mm in the porta hepatis, however, this is stable compared to prior
studies. No significant intrahepatic biliary ductal dilatation. The
appearance of the liver, pancreas, spleen and bilateral kidneys is
unremarkable.

No significant volume of ascites. No pneumoperitoneum. No pathologic
distention of small bowel. No definite lymphadenopathy identified
within the abdomen or pelvis. Atherosclerosis throughout the
abdominal and pelvic vasculature, without evidence of aneurysm.
Ovaries are not confidently identified may be surgically absent or
atrophic. Urinary bladder is unremarkable in appearance.

As with prior examinations, there is some amorphous soft tissue
density in the anterior abdominal wall around the umbilicus, similar
to prior study from 05/04/2013 which is favored to represent sequela
of prior wound infection with anterior abdominal wall abscesses or
fistulous tracts. Given the stability in size, persistent intra
abdominal wall abscesses are unlikely, and this is favored to
represent chronic postoperative scar tissue at this time.

Musculoskeletal: There are no aggressive appearing lytic or blastic
lesions noted in the visualized portions of the skeleton.
IMPRESSION: 1. No acute findings in the abdomen or pelvis to account for the
patient's symptoms.
2. Persistent prominence of abnormal soft tissue in the anterior
abdominal wall around the umbilicus, likely to represent resolving
postoperative scar tissue. No definite abdominal wall abscess is
confidently identified at this time, although the presence of
persistent infected tissue with draining sinus tracts is not
excluded, and clinical correlation with physical exam is
recommended.
3. 2 cm left adrenal adenoma is unchanged.
4. Postoperative changes redemonstrated, as above.
5. Persistent prominence of the extrahepatic bile ducts, similar to
prior studies. This is favored to be functional postcholecystectomy
bile duct dilatation rather than evidence of obstruction,
particularly in correlation with prior MRI 06/03/2013 (see that
report for further details).

## 2014-11-01 ENCOUNTER — Emergency Department (HOSPITAL_COMMUNITY): Payer: Medicare Other

## 2014-11-01 ENCOUNTER — Inpatient Hospital Stay (HOSPITAL_COMMUNITY)
Admission: EM | Admit: 2014-11-01 | Discharge: 2014-11-04 | DRG: 897 | Disposition: A | Discharge status: Home Health | Payer: Medicare Other | Attending: Internal Medicine | Admitting: Internal Medicine

## 2014-11-01 ENCOUNTER — Encounter (HOSPITAL_COMMUNITY): Payer: Self-pay | Admitting: Emergency Medicine

## 2014-11-01 DIAGNOSIS — F329 Major depressive disorder, single episode, unspecified: Secondary | ICD-10-CM | POA: Diagnosis present

## 2014-11-01 DIAGNOSIS — E876 Hypokalemia: Secondary | ICD-10-CM | POA: Diagnosis present

## 2014-11-01 DIAGNOSIS — F10129 Alcohol abuse with intoxication, unspecified: Principal | ICD-10-CM | POA: Diagnosis present

## 2014-11-01 DIAGNOSIS — R270 Ataxia, unspecified: Secondary | ICD-10-CM | POA: Diagnosis not present

## 2014-11-01 DIAGNOSIS — F259 Schizoaffective disorder, unspecified: Secondary | ICD-10-CM | POA: Diagnosis present

## 2014-11-01 DIAGNOSIS — W19XXXA Unspecified fall, initial encounter: Secondary | ICD-10-CM | POA: Diagnosis present

## 2014-11-01 DIAGNOSIS — F1010 Alcohol abuse, uncomplicated: Secondary | ICD-10-CM | POA: Diagnosis not present

## 2014-11-01 DIAGNOSIS — R32 Unspecified urinary incontinence: Secondary | ICD-10-CM | POA: Diagnosis present

## 2014-11-01 DIAGNOSIS — K219 Gastro-esophageal reflux disease without esophagitis: Secondary | ICD-10-CM | POA: Diagnosis present

## 2014-11-01 DIAGNOSIS — D573 Sickle-cell trait: Secondary | ICD-10-CM | POA: Diagnosis present

## 2014-11-01 DIAGNOSIS — F17200 Nicotine dependence, unspecified, uncomplicated: Secondary | ICD-10-CM | POA: Diagnosis present

## 2014-11-01 DIAGNOSIS — R260 Ataxic gait: Secondary | ICD-10-CM | POA: Diagnosis not present

## 2014-11-01 DIAGNOSIS — I10 Essential (primary) hypertension: Secondary | ICD-10-CM | POA: Diagnosis present

## 2014-11-01 DIAGNOSIS — R531 Weakness: Secondary | ICD-10-CM

## 2014-11-01 DIAGNOSIS — E538 Deficiency of other specified B group vitamins: Secondary | ICD-10-CM | POA: Diagnosis present

## 2014-11-01 DIAGNOSIS — R296 Repeated falls: Secondary | ICD-10-CM | POA: Diagnosis not present

## 2014-11-01 DIAGNOSIS — Z818 Family history of other mental and behavioral disorders: Secondary | ICD-10-CM | POA: Diagnosis not present

## 2014-11-01 DIAGNOSIS — M5022 Other cervical disc displacement, mid-cervical region: Secondary | ICD-10-CM | POA: Diagnosis not present

## 2014-11-01 DIAGNOSIS — K3184 Gastroparesis: Secondary | ICD-10-CM | POA: Diagnosis present

## 2014-11-01 DIAGNOSIS — M4802 Spinal stenosis, cervical region: Secondary | ICD-10-CM | POA: Diagnosis not present

## 2014-11-01 DIAGNOSIS — G629 Polyneuropathy, unspecified: Secondary | ICD-10-CM | POA: Diagnosis not present

## 2014-11-01 DIAGNOSIS — R269 Unspecified abnormalities of gait and mobility: Secondary | ICD-10-CM | POA: Diagnosis not present

## 2014-11-01 DIAGNOSIS — F17210 Nicotine dependence, cigarettes, uncomplicated: Secondary | ICD-10-CM | POA: Diagnosis present

## 2014-11-01 DIAGNOSIS — R410 Disorientation, unspecified: Secondary | ICD-10-CM | POA: Diagnosis not present

## 2014-11-01 DIAGNOSIS — R29810 Facial weakness: Secondary | ICD-10-CM | POA: Diagnosis not present

## 2014-11-01 DIAGNOSIS — Z720 Tobacco use: Secondary | ICD-10-CM | POA: Diagnosis present

## 2014-11-01 DIAGNOSIS — F319 Bipolar disorder, unspecified: Secondary | ICD-10-CM | POA: Diagnosis present

## 2014-11-01 DIAGNOSIS — M9971 Connective tissue and disc stenosis of intervertebral foramina of cervical region: Secondary | ICD-10-CM | POA: Diagnosis not present

## 2014-11-01 DIAGNOSIS — F419 Anxiety disorder, unspecified: Secondary | ICD-10-CM | POA: Diagnosis present

## 2014-11-01 LAB — URINALYSIS, ROUTINE W REFLEX MICROSCOPIC
GLUCOSE, UA: NEGATIVE mg/dL
Hgb urine dipstick: NEGATIVE
KETONES UR: NEGATIVE mg/dL
Leukocytes, UA: NEGATIVE
Nitrite: NEGATIVE
PH: 5.5 (ref 5.0–8.0)
Protein, ur: NEGATIVE mg/dL
SPECIFIC GRAVITY, URINE: 1.03 (ref 1.005–1.030)
Urobilinogen, UA: 0.2 mg/dL (ref 0.0–1.0)

## 2014-11-01 LAB — CBC WITH DIFFERENTIAL/PLATELET
Basophils Absolute: 0 10*3/uL (ref 0.0–0.1)
Basophils Relative: 0 % (ref 0–1)
EOS ABS: 0 10*3/uL (ref 0.0–0.7)
Eosinophils Relative: 1 % (ref 0–5)
HEMATOCRIT: 32.6 % — AB (ref 36.0–46.0)
Hemoglobin: 11.4 g/dL — ABNORMAL LOW (ref 12.0–15.0)
Lymphocytes Relative: 31 % (ref 12–46)
Lymphs Abs: 1.3 10*3/uL (ref 0.7–4.0)
MCH: 31.1 pg (ref 26.0–34.0)
MCHC: 35 g/dL (ref 30.0–36.0)
MCV: 89.1 fL (ref 78.0–100.0)
MONOS PCT: 14 % — AB (ref 3–12)
Monocytes Absolute: 0.6 10*3/uL (ref 0.1–1.0)
NEUTROS ABS: 2.2 10*3/uL (ref 1.7–7.7)
NEUTROS PCT: 54 % (ref 43–77)
Platelets: 195 10*3/uL (ref 150–400)
RBC: 3.66 MIL/uL — ABNORMAL LOW (ref 3.87–5.11)
RDW: 16.1 % — ABNORMAL HIGH (ref 11.5–15.5)
WBC: 4.1 10*3/uL (ref 4.0–10.5)

## 2014-11-01 LAB — COMPREHENSIVE METABOLIC PANEL
ALBUMIN: 3.6 g/dL (ref 3.5–5.0)
ALK PHOS: 92 U/L (ref 38–126)
ALT: 29 U/L (ref 14–54)
AST: 54 U/L — ABNORMAL HIGH (ref 15–41)
Anion gap: 12 (ref 5–15)
BILIRUBIN TOTAL: 0.7 mg/dL (ref 0.3–1.2)
CALCIUM: 8.9 mg/dL (ref 8.9–10.3)
CO2: 25 mmol/L (ref 22–32)
CREATININE: 0.39 mg/dL — AB (ref 0.44–1.00)
Chloride: 104 mmol/L (ref 101–111)
GFR calc Af Amer: >60 mL/min (ref >60)
GFR calc non Af Amer: >60 mL/min (ref >60)
GLUCOSE: 102 mg/dL — AB (ref 65–99)
Potassium: 3.1 mmol/L — ABNORMAL LOW (ref 3.5–5.1)
Sodium: 141 mmol/L (ref 135–145)
TOTAL PROTEIN: 7.4 g/dL (ref 6.5–8.1)

## 2014-11-01 LAB — MAGNESIUM: MAGNESIUM: 1.7 mg/dL (ref 1.7–2.4)

## 2014-11-01 LAB — RAPID URINE DRUG SCREEN, HOSP PERFORMED
Amphetamines: NOT DETECTED
BENZODIAZEPINES: NOT DETECTED
Barbiturates: NOT DETECTED
COCAINE: NOT DETECTED
OPIATES: NOT DETECTED
Tetrahydrocannabinol: NOT DETECTED

## 2014-11-01 LAB — VITAMIN B12: Vitamin B-12: 135 pg/mL — ABNORMAL LOW (ref 180–914)

## 2014-11-01 LAB — TSH: TSH: 0.57 u[IU]/mL (ref 0.350–4.500)

## 2014-11-01 MED ORDER — HYDROCORTISONE 2.5 % RE CREA
1.0000 "application " | TOPICAL_CREAM | Freq: Two times a day (BID) | RECTAL | Status: DC
  Administered 2014-11-01 – 2014-11-02 (×3): 1 via RECTAL
  Filled 2014-11-01: qty 28.35

## 2014-11-01 MED ORDER — POTASSIUM CHLORIDE CRYS ER 20 MEQ PO TBCR
20.0000 meq | EXTENDED_RELEASE_TABLET | Freq: Once | ORAL | Status: AC
  Administered 2014-11-01: 20 meq via ORAL
  Filled 2014-11-01: qty 1

## 2014-11-01 MED ORDER — FLUTICASONE PROPIONATE 50 MCG/ACT NA SUSP
1.0000 | Freq: Every day | NASAL | Status: DC
  Administered 2014-11-01 – 2014-11-04 (×4): 1 via NASAL
  Filled 2014-11-01: qty 16

## 2014-11-01 MED ORDER — THIAMINE HCL 100 MG/ML IJ SOLN: Freq: Once | INTRAVENOUS | Status: DC

## 2014-11-01 MED ORDER — POLYETHYLENE GLYCOL 3350 17 G PO PACK: 17.0000 g | PACK | Freq: Every day | ORAL | Status: DC | PRN

## 2014-11-01 MED ORDER — HYDROCODONE-ACETAMINOPHEN 5-325 MG PO TABS
1.0000 | ORAL_TABLET | ORAL | Status: DC | PRN
  Administered 2014-11-01: 2 via ORAL
  Administered 2014-11-01: 1 via ORAL
  Administered 2014-11-02: 2 via ORAL
  Administered 2014-11-03: 1 via ORAL
  Administered 2014-11-03: 2 via ORAL
  Filled 2014-11-01 (×2): qty 2
  Filled 2014-11-01: qty 1
  Filled 2014-11-01 (×2): qty 2

## 2014-11-01 MED ORDER — ARIPIPRAZOLE 10 MG PO TABS
5.0000 mg | ORAL_TABLET | Freq: Two times a day (BID) | ORAL | Status: DC
  Administered 2014-11-01 – 2014-11-04 (×7): 5 mg via ORAL
  Filled 2014-11-01 (×7): qty 1

## 2014-11-01 MED ORDER — ONDANSETRON HCL 4 MG/2ML IJ SOLN: 4.0000 mg | Freq: Four times a day (QID) | INTRAMUSCULAR | Status: DC | PRN

## 2014-11-01 MED ORDER — ENOXAPARIN SODIUM 40 MG/0.4ML ~~LOC~~ SOLN
40.0000 mg | SUBCUTANEOUS | Status: DC
  Administered 2014-11-01 – 2014-11-03 (×3): 40 mg via SUBCUTANEOUS
  Filled 2014-11-01 (×3): qty 0.4

## 2014-11-01 MED ORDER — THIAMINE HCL 100 MG/ML IJ SOLN
Freq: Once | INTRAVENOUS | Status: AC
  Administered 2014-11-01: 14:00:00 via INTRAVENOUS
  Filled 2014-11-01: qty 1000

## 2014-11-01 MED ORDER — ESCITALOPRAM OXALATE 10 MG PO TABS
10.0000 mg | ORAL_TABLET | Freq: Every day | ORAL | Status: DC
  Administered 2014-11-01 – 2014-11-04 (×4): 10 mg via ORAL
  Filled 2014-11-01 (×4): qty 1

## 2014-11-01 MED ORDER — ADULT MULTIVITAMIN W/MINERALS CH
1.0000 | ORAL_TABLET | Freq: Every day | ORAL | Status: DC
  Administered 2014-11-01 – 2014-11-04 (×4): 1 via ORAL
  Filled 2014-11-01 (×4): qty 1

## 2014-11-01 MED ORDER — SUCRALFATE 1 G PO TABS
1.0000 g | ORAL_TABLET | Freq: Four times a day (QID) | ORAL | Status: DC
  Administered 2014-11-01 – 2014-11-04 (×11): 1 g via ORAL
  Filled 2014-11-01 (×11): qty 1

## 2014-11-01 MED ORDER — SODIUM CHLORIDE 0.9 % IJ SOLN
3.0000 mL | Freq: Two times a day (BID) | INTRAMUSCULAR | Status: DC
  Administered 2014-11-01 – 2014-11-02 (×2): 3 mL via INTRAVENOUS

## 2014-11-01 MED ORDER — LORAZEPAM 1 MG PO TABS
2.0000 mg | ORAL_TABLET | Freq: Once | ORAL | Status: AC
  Administered 2014-11-01: 2 mg via ORAL
  Filled 2014-11-01: qty 2

## 2014-11-01 MED ORDER — TRAZODONE HCL 50 MG PO TABS
25.0000 mg | ORAL_TABLET | Freq: Every evening | ORAL | Status: DC | PRN
  Administered 2014-11-02 – 2014-11-03 (×2): 25 mg via ORAL
  Filled 2014-11-01 (×2): qty 1

## 2014-11-01 MED ORDER — LORAZEPAM 1 MG PO TABS
1.0000 mg | ORAL_TABLET | Freq: Four times a day (QID) | ORAL | Status: DC | PRN
  Administered 2014-11-03: 1 mg via ORAL
  Filled 2014-11-01: qty 1

## 2014-11-01 MED ORDER — THIAMINE HCL 100 MG/ML IJ SOLN
100.0000 mg | Freq: Once | INTRAMUSCULAR | Status: AC
  Administered 2014-11-01: 100 mg via INTRAVENOUS
  Filled 2014-11-01: qty 2

## 2014-11-01 MED ORDER — ACETAMINOPHEN 650 MG RE SUPP: 650.0000 mg | Freq: Four times a day (QID) | RECTAL | Status: DC | PRN

## 2014-11-01 MED ORDER — ONDANSETRON HCL 4 MG PO TABS
4.0000 mg | ORAL_TABLET | Freq: Four times a day (QID) | ORAL | Status: DC | PRN
  Administered 2014-11-03: 4 mg via ORAL
  Filled 2014-11-01: qty 1

## 2014-11-01 MED ORDER — VITAMIN B-1 100 MG PO TABS
100.0000 mg | ORAL_TABLET | Freq: Once | ORAL | Status: AC
  Administered 2014-11-01: 100 mg via ORAL
  Filled 2014-11-01: qty 1

## 2014-11-01 MED ORDER — LORAZEPAM 2 MG/ML IJ SOLN: 1.0000 mg | Freq: Four times a day (QID) | INTRAMUSCULAR | Status: DC | PRN

## 2014-11-01 MED ORDER — SORBITOL 70 % SOLN: 30.0000 mL | Freq: Every day | Status: DC | PRN

## 2014-11-01 MED ORDER — ACETAMINOPHEN 325 MG PO TABS
650.0000 mg | ORAL_TABLET | Freq: Four times a day (QID) | ORAL | Status: DC | PRN
  Administered 2014-11-03: 650 mg via ORAL
  Filled 2014-11-01: qty 2

## 2014-11-01 MED ORDER — FOLIC ACID 1 MG PO TABS
1.0000 mg | ORAL_TABLET | Freq: Every day | ORAL | Status: DC
  Administered 2014-11-01 – 2014-11-04 (×4): 1 mg via ORAL
  Filled 2014-11-01 (×4): qty 1

## 2014-11-01 MED ORDER — POTASSIUM CHLORIDE IN NACL 20-0.45 MEQ/L-% IV SOLN
INTRAVENOUS | Status: DC
  Administered 2014-11-01 – 2014-11-03 (×5): via INTRAVENOUS
  Filled 2014-11-01 (×11): qty 1000

## 2014-11-01 MED ORDER — ALUM & MAG HYDROXIDE-SIMETH 200-200-20 MG/5ML PO SUSP: 30.0000 mL | Freq: Four times a day (QID) | ORAL | Status: DC | PRN

## 2014-11-01 MED ORDER — LISINOPRIL 10 MG PO TABS
10.0000 mg | ORAL_TABLET | Freq: Every day | ORAL | Status: DC
  Administered 2014-11-01 – 2014-11-04 (×4): 10 mg via ORAL
  Filled 2014-11-01 (×4): qty 1

## 2014-11-01 MED ORDER — MUPIROCIN 2 % EX OINT
1.0000 "application " | TOPICAL_OINTMENT | Freq: Two times a day (BID) | CUTANEOUS | Status: DC
  Administered 2014-11-01 – 2014-11-04 (×5): 1 via NASAL
  Filled 2014-11-01: qty 22

## 2014-11-01 MED ORDER — METOCLOPRAMIDE HCL 10 MG PO TABS
10.0000 mg | ORAL_TABLET | Freq: Three times a day (TID) | ORAL | Status: DC
  Administered 2014-11-01 – 2014-11-03 (×10): 10 mg via ORAL
  Filled 2014-11-01 (×10): qty 1

## 2014-11-01 MED ORDER — PANTOPRAZOLE SODIUM 40 MG PO TBEC
40.0000 mg | DELAYED_RELEASE_TABLET | Freq: Two times a day (BID) | ORAL | Status: DC
  Administered 2014-11-01 – 2014-11-04 (×6): 40 mg via ORAL
  Filled 2014-11-01 (×6): qty 1

## 2014-11-01 MED ORDER — GADOBENATE DIMEGLUMINE 529 MG/ML IV SOLN
12.0000 mL | Freq: Once | INTRAVENOUS | Status: AC | PRN
  Administered 2014-11-01: 12 mL via INTRAVENOUS

## 2014-11-01 NOTE — ED Notes (Signed)
Patient with c/o frequent falls and "my legs giving out" x 1 month with last fall on this past Friday. States she has been "wetting the bed too". Alert/oriented x 4.

## 2014-11-01 NOTE — ED Notes (Signed)
MD at bedside. 

## 2014-11-01 NOTE — ED Provider Notes (Signed)
CSN: [id number]     Arrival date & time 11/01/14  0729 History   First MD Initiated Contact with Patient 11/01/14 [phone]     Chief Complaint  Patient presents with  . Fall     (Consider location/radiation/quality/duration/timing/severity/associated sxs/prior Treatment) HPI Comments: The pt has a hx of multiple medical problems, she has Lupus and chronic abdominal wall abscess, she also is admitted several times over last year for  N/v.  She presents to the hospital today stating that approximately one month ago she started to develop weakness in her bilateral legs. She states that this has been a progressive problem and over the last month she has had multiple falls and now finds that she has to move around by crawling on the floor with her arms because she cannot walk on her legs. She had a fall on Friday, this was more significant with regards to weakness of the legs, she finds that her left leg is weaker than the right leg but both legs are weak, left arm is weaker than the right arm but both arms are weak. She also states that she has had a slight change in her vision with left eye blurriness. She reports that she is having no chest pain, fever, shortness of breath, cough, nausea, vomiting, diarrhea. She has not been evaluated for these complaints because she was "scared". She does have a history of lupus, she takes no medications. In fact the daughter states that she has gone off of all of her home medications. She denies back pain or neck pain. She does have some urinary incontinence  Patient is a 49 y.o. female presenting with fall. The history is provided by the patient, a relative and medical records.  Fall    Past Medical History  Diagnosis Date  . Lupus   . 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 Hypertension   . Abscess     soft tissue  . Suicide attempt   . Blood transfusion without reported diagnosis   . Chronic abdominal pain   . Chronic wound infection of abdomen    . Adrenal mass   . Anxiety   . Depression   . GERD (gastroesophageal reflux disease)   . Sickle cell trait   . Gastroparesis Nov 2015  . Nausea and vomiting     chronic, recurrent  . Alcohol abuse   . History of Billroth II operation   . Lung nodule < 6cm on CT 04/25/2014  . Hemorrhoid     internal large  . Diverticulosis     colonoscopy 04/2014 moderat pan colonic  . Colon polyp     colonoscopy 04/2014  . Gastritis     EGD 05/2014  . Hiatal hernia   . Schatzki's ring     patent per EGD 04/2014  . Lung nodule     CT 02/2014 needs repeat 1 month   Past Surgical History  Procedure Laterality Date  . Abdominal surgery    . Abdominal hysterectomy  2013    [place]  . Debridement of abdominal wall abscess N/A 02/08/2013    Procedure: DEBRIDEMENT OF ABDOMINAL WALL ABSCESS;  Surgeon: Jamesetta So, MD;  Location: [facility];  Service: General;  Laterality: N/A;  . Billroth ii procedure       [place], first 2000, 2005/2006.  . Colonoscopy      in [place]  . Cholecystectomy    . Colonoscopy with propofol N/A 05/20/2013    Dr.Rourk- inadequate prep, normal  appearing rectum, grossly normal colon aside from pancolonic diverticula, normal terminal ileum bx= unremarkable colonic mucosa. Due for early interval 2016.   Marland Kitchen Esophagogastroduodenoscopy (egd) with propofol N/A 05/20/2013    Dr.Rourk- s/p prior gastric surgery with normal esophagus, residual gastric mucosa and patent efferent limb  . Esophageal biopsy  05/20/2013    Procedure: BIOPSIES OF ASCENDING AND SIGMOID COLON;  Surgeon: Daneil Dolin, MD;  Location: [facility];  Service: Endoscopy;;  . Wound exploration Right 06/24/2013    Procedure: exploration of traumatic wound right wrist;  Surgeon: Tennis Must, MD;  Location: [place];  Service: Orthopedics;  Laterality: Right;  . Tendon repar Right     wrist  . Adrenal turmor removal    . Esophagogastroduodenoscopy (egd) with propofol N/A 02/03/2014    Dr. Gala Romney:  s/p hemigastrectomy with  retained gastric contents. Residual gastric mucosa and efferent limb appeared normal otherwise. Query gastroparesis.   . Colonoscopy with propofol N/A 04/26/2014    [facility]:CBSWHQ ileum/one colon polyp removed/moderate pan-colonic diverticulosis/large internal hemorrhoids  . Esophagogastroduodenoscopy (egd) with propofol N/A 04/26/2014    PRF:[id number] ring/small HH/mild non-erosive gasrtitis/normal anastomosis  . Esophageal biopsy  04/26/2014    Procedure: BIOPSIES;  Surgeon: Danie Binder, MD;  Location: [facility];  Service: Endoscopy;;   Family History  Problem Relation Age of Onset  . Brain cancer Son   . Schizophrenia Son   . Cancer Son     brain  . Breast cancer Maternal Aunt   . Bipolar disorder Maternal Aunt   . Drug abuse Maternal Aunt   . Colon cancer Maternal Grandmother     late 79s, early 40s  . Lung cancer Father   . Cancer Father     mets  . Liver disease Neg Hx   . Drug abuse Mother   . Drug abuse Sister   . Drug abuse Brother   . Bipolar disorder Paternal Grandfather   . Bipolar disorder Cousin    Social History  Substance Use Topics  . Smoking status: Current Every Day Smoker -- 0.25 packs/day for 35 years    Types: Cigarettes  . Smokeless tobacco: Never Used  . Alcohol Use: 7.2 oz/week    12 Cans of beer, 0 Standard drinks or equivalent per week     Comment: ETOH abuse. as of 04/25/14, states drinking 1-2 beers per day   OB History    No data available     Review of Systems  All other systems reviewed and are negative.     Allergies  Codeine and Morphine and related  Home Medications   Prior to Admission medications   Medication Sig Start Date End Date Taking? Authorizing Provider  ARIPiprazole (ABILIFY) 5 MG tablet Take 1 tablet (5 mg total) by mouth 2 (two) times daily. 07/16/13  Yes Cloria Spring, MD  escitalopram (LEXAPRO) 10 MG tablet Take 10 mg by mouth daily.   Yes Historical Provider, MD  fluticasone (FLONASE) 50 MCG/ACT nasal spray Place 1 spray  into both nostrils daily.   Yes Historical Provider, MD  hydrocortisone (ANUSOL-HC) 2.5 % rectal cream Place 1 application rectally 2 (two) times daily. 05/17/14  Yes Orvil Feil, NP  ibuprofen (ADVIL,MOTRIN) 200 MG tablet Take 400 mg by mouth every 6 (six) hours as needed.   Yes Historical Provider, MD  lisinopril (PRINIVIL,ZESTRIL) 10 MG tablet Take 10 mg by mouth daily.   Yes Historical Provider, MD  metoCLOPramide (REGLAN) 10 MG tablet Take 1 tablet (10 mg  total) by mouth 4 (four) times daily -  before meals and at bedtime. 04/06/14  Yes Lezlie Octave Black, NP  mupirocin ointment (BACTROBAN) 2 % Place into the nose 2 (two) times daily. Patient taking differently: Place 1 application into the nose 2 (two) times daily.  12/13/13  Yes Maryann Mikhail, DO  ondansetron (ZOFRAN) 8 MG tablet Take 1 tablet (8 mg total) by mouth every 8 (eight) hours as needed for nausea or vomiting. 09/04/14  Yes Daleen Bo, MD  pantoprazole (PROTONIX) 40 MG tablet Take 1 tablet (40 mg total) by mouth 2 (two) times daily before a meal. 09/04/14  Yes Daleen Bo, MD  sucralfate (CARAFATE) 1 G tablet Take 1 g by mouth 4 (four) times daily.   Yes Historical Provider, MD   BP 103/77 mmHg  Pulse 92  Temp(Src) 98.1 F (36.7 C) (Oral)  Resp 20  Ht 5\' 3"  (1.6 m)  Wt 133 lb (60.328 kg)  BMI 23.57 kg/m2  SpO2 100% Physical Exam  Constitutional: She appears well-developed and well-nourished. No distress.  HENT:  Head: Normocephalic and atraumatic.  Mouth/Throat: Oropharynx is clear and moist. No oropharyngeal exudate.  Eyes: Conjunctivae and EOM are normal. Pupils are equal, round, and reactive to light. Right eye exhibits no discharge. Left eye exhibits no discharge. No scleral icterus.  Neck: Normal range of motion. Neck supple. No JVD present. No thyromegaly present.  Cardiovascular: Normal rate, regular rhythm, normal heart sounds and intact distal pulses.  Exam reveals no gallop and no friction rub.   No murmur  heard. Pulmonary/Chest: Effort normal and breath sounds normal. No respiratory distress. She has no wheezes. She has no rales.  Abdominal: Soft. Bowel sounds are normal. She exhibits no distension and no mass. There is tenderness ( Small amount of midline tenderness associated with small open draining wound at the base of her exploratory laparotomy scar).  Musculoskeletal: Normal range of motion. She exhibits no edema or tenderness.  Able to move all 4 extremities however there is significant weakness in the legs. Compartments are soft, joints are supple  Lymphadenopathy:    She has no cervical adenopathy.  Neurological: She is alert. Coordination normal.  Cranial nerves III through XII are intact, left arm with pronator drift, lower extremities left weaker than the right, barely able to lift the right leg off the bed.  Pronator drift of the left arm is present, gross visual acuity is normal, speech is clear, reflexes are diminished but preserved at the patellar tendons  Skin: Skin is warm and dry. No rash noted. No erythema.  Psychiatric: She has a normal mood and affect. Her behavior is normal.  Nursing note and vitals reviewed.   ED Course  Procedures (including critical care time) Labs Review Labs Reviewed  CBC WITH DIFFERENTIAL/PLATELET - Abnormal; Notable for the following:    RBC 3.66 (*)    Hemoglobin 11.4 (*)    HCT 32.6 (*)    RDW 16.1 (*)    Monocytes Relative 14 (*)    All other components within normal limits  COMPREHENSIVE METABOLIC PANEL - Abnormal; Notable for the following:    Potassium 3.1 (*)    Glucose, Bld 102 (*)    BUN <5 (*)    Creatinine, Ser 0.39 (*)    AST 54 (*)    All other components within normal limits  URINALYSIS, ROUTINE W REFLEX MICROSCOPIC (NOT AT [facility]) - Abnormal; Notable for the following:    Bilirubin Urine SMALL (*)  All other components within normal limits  VITAMIN B12 - Abnormal; Notable for the following:    Vitamin B-12 135 (*)     All other components within normal limits  MRSA PCR SCREENING  MAGNESIUM  TSH  URINE RAPID DRUG SCREEN, HOSP PERFORMED  COMPREHENSIVE METABOLIC PANEL  CBC    Imaging Review Mr Jeri Cos Wo Contrast  11/01/2014   CLINICAL DATA:  Weakness and confusion with multiple falls. Symptom duration approximately 1 month. History of urinary incontinence.  EXAM: MRI HEAD WITHOUT AND WITH CONTRAST  TECHNIQUE: Multiplanar, multiecho pulse sequences of the brain and surrounding structures were obtained without and with intravenous contrast.  CONTRAST:  80mL MULTIHANCE GADOBENATE DIMEGLUMINE 529 MG/ML IV SOLN  COMPARISON:  MRI cervical and thoracic spine reported separately.  FINDINGS: The patient was unable to remain motionless for the exam. Small or subtle lesions could be overlooked.  No evidence for acute infarction, hemorrhage, mass lesion, hydrocephalus, or extra-axial fluid. Mild atrophy. No appreciable white matter disease.  Partial empty sella. Pineal and cerebellar tonsils unremarkable. No upper cervical lesions.  Flow voids are maintained throughout the carotid, basilar, and vertebral arteries. There are no areas of chronic hemorrhage.  Post infusion, no abnormal enhancement of the brain or meninges.  Visualized calvarium, skull base, and upper cervical osseous structures unremarkable. Scalp and extracranial soft tissues, orbits, sinuses, and mastoids show no acute process.  IMPRESSION: Motion degraded examination demonstrating no acute intracranial findings. Mild atrophy is suggested which may be premature for the patient's age.  Partial empty sella, nonspecific.   Electronically Signed   By: Staci Righter M.D.   On: 11/01/2014 11:51   Mr Cervical Spine W Wo Contrast  11/01/2014   CLINICAL DATA:  Bilateral weakness. Multiple falls. Confusion. Initial encounter.  EXAM: MRI CERVICAL SPINE WITHOUT AND WITH CONTRAST  TECHNIQUE: Multiplanar and multiecho pulse sequences of the cervical spine, to include the  craniocervical junction and cervicothoracic junction, were obtained according to standard protocol without and with intravenous contrast.  CONTRAST:  12 mL MULTIHANCE GADOBENATE DIMEGLUMINE 529 MG/ML IV SOLN  COMPARISON:  None.  FINDINGS: There is mild kyphosis about the C5-6 level due to loss of disc space height anteriorly. Vertebral body height is maintained. Mild degenerative endplate signal change is seen at C5-6. There is no worrisome marrow lesion. The craniocervical junction is normal and cervical cord signal is normal. Imaged paraspinous structures are unremarkable.  C2-3: Shallow disc bulge to the right without central canal or foraminal stenosis.  C3-4: Very shallow central protrusion without central canal or foraminal stenosis.  C4-5: Minimal disc bulge without central canal or foraminal stenosis.  C5-6: A disc osteophyte complex effaces the ventral thecal sac but the central canal appears open. Mild left foraminal narrowing is noted. The right foramen is patent.  C6-7: Very shallow left paracentral protrusion without central canal or foraminal stenosis.  C7-T1:  Negative.  IMPRESSION: No finding to explain the patient's symptoms.  Shallow disc bulge at C5-6 effaces the ventral thecal sac causing mild central canal narrowing. There is no cord deformity. The left foramen is also mildly narrowed at this level.  Very shallow left paracentral protrusion C6-7 without central canal or foraminal stenosis.   Electronically Signed   By: Inge Rise M.D.   On: 11/01/2014 11:50   Mr Thoracic Spine W Wo Contrast  11/01/2014   CLINICAL DATA:  Bilateral weakness. Multiple falls. Confusion. Initial encounter.  EXAM: MRI THORACIC SPINE WITHOUT AND WITH CONTRAST  TECHNIQUE: Multiplanar and  multiecho pulse sequences of the thoracic spine were obtained without and with intravenous contrast.  CONTRAST:  12 mL MULTIHANCE GADOBENATE DIMEGLUMINE 529 MG/ML IV SOLN  COMPARISON:  None.  FINDINGS: Vertebral body height,  signal and alignment are maintained. The thoracic cord demonstrates normal size, shape and signal at all levels. The central canal and foramina are widely patent at all levels. No pathologic enhancement after contrast administration is identified. Imaged paraspinous structures are unremarkable.  IMPRESSION: Negative examination.  No finding to explain the patient's symptoms.   Electronically Signed   By: Inge Rise M.D.   On: 11/01/2014 11:54   I, Aleathea Pugmire D, personally reviewed and evaluated these images and lab results as part of my medical decision-making.    MDM   Final diagnoses:  Weakness  Ataxia    The patient has normal vital signs however she is exhibiting pathology that would be consistent with spinal cord abnormalities. Additionally her visual changes raises concern for an acute brain process as well. I discussed the patient's care with Dr. Doy Mince of neurology who agrees with MRI imaging of the spine as well as the brain. The patient has been informed that this will take several hours, she is in agreement with the plan.  The patient has been to MRI, brain, cervical and thoracic spines have been imaged without acute findings, at this point I do not have an exact answer to the source of the patient's symptoms however when she tries to ambulate she is significantly ataxic and unable to ambulate without significant assistance. Safety is a concern. The patient now endorses having increased alcohol use, she has not had any alcohol in 2 days and she fell into a ditch. That is when the daughter states that most of her symptoms started. This could possibly be psychogenic, she will be admitted to the hospitalist service. I discussed her care with the hospitalist who will see her in the emergency department for admission.    Noemi Chapel, MD 11/02/14 [phone]

## 2014-11-01 NOTE — ED Notes (Signed)
PT informed nursing of past anxiety during MRI. MD made aware and new verbal order.

## 2014-11-01 NOTE — ED Notes (Signed)
PT ambulated several feet with nurse and MD present and had difficulty with balance but was able to bear weight on legs.

## 2014-11-01 NOTE — ED Notes (Signed)
Bladder Scanner result post residual urine of 153ml.

## 2014-11-01 NOTE — ED Notes (Signed)
Pt still remains in radiology.

## 2014-11-01 NOTE — Progress Notes (Signed)
Patient arrived from ED to room with banana bag infusing. Order was discontinued in the ED. Dyanne Carrel, NP notified. Orders to finish the remaining banana bag and then start the new fluids, 1/2 NS KCl 20 mEq at 75 hour. Pharmacy notified to clarify order in the system.

## 2014-11-01 NOTE — H&P (Signed)
[facility] History and Physical  Angel Price ZHY:[id number] DOB: 12/02/1965 DOA: 11/01/2014  Referring physician: Sabra Heck PCP: Rosita Fire, MD   Chief Complaint: falling  HPI: Angel Price is a 49 y.o. female with a past medical history that includes EtOH abuse, frequent gastro-paresis, anemia, schizoaffective disorder, hypertension, presents to the emergency Department chief complaint of inability to walk after fall 3 days ago. Initial evaluation in the emergency department concerning for spinal cord abnormalities and/or acute brain process. She obtained an MRI of C-spine, thoracic spine and brain all of which were within the limits of normal. In spite of this she had difficulty walking and therefore was referred for admission. Patient reports that 4 days ago she fell down because her "legs gave out". She basically has been crawling ever since. Associated symptoms include bilateral lower extremity pain described as sharp constant with weightbearing located from hips to toes. Nothing makes better and ambulating next worse. She denies headache visual disturbances numbness or tingling of extremities. She denies being intoxicated during the fall but described her state as "high". She reports that while in this states she is able to communicate function is independent with ADLs and able to walk. She reports drinking 5-6 beers a day. She denies abdominal pain nausea vomiting diarrhea constipation. She denies numbness or tingling of her extremities. She denies dysuria hematuria frequency or urgency. Workup in the emergency department reveals mild hypokalemia with a potassium of 3.1. MRI as noted above. Urinalysis unremarkable In the emergency department she is afebrile hemodynamically stable and not hypoxic. She is given IV fluids and Zofran. As well as thiamine  Review of Systems:     Past Medical History  Diagnosis Date  . Lupus   . 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  Hypertension   . Abscess     soft tissue  . Suicide attempt   . Blood transfusion without reported diagnosis   . Chronic abdominal pain   . Chronic wound infection of abdomen   . Adrenal mass   . Anxiety   . Depression   . GERD (gastroesophageal reflux disease)   . Sickle cell trait   . Gastroparesis Nov 2015  . Nausea and vomiting     chronic, recurrent  . Alcohol abuse   . History of Billroth II operation   . Lung nodule < 6cm on CT 04/25/2014  . Hemorrhoid     internal large  . Diverticulosis     colonoscopy 04/2014 moderat pan colonic  . Colon polyp     colonoscopy 04/2014  . Gastritis     EGD 05/2014  . Hiatal hernia   . Schatzki's ring     patent per EGD 04/2014  . Lung nodule     CT 02/2014 needs repeat 1 month   Past Surgical History  Procedure Laterality Date  . Abdominal surgery    . Abdominal hysterectomy  2013    [place]  . Debridement of abdominal wall abscess N/A 02/08/2013    Procedure: DEBRIDEMENT OF ABDOMINAL WALL ABSCESS;  Surgeon: Jamesetta So, MD;  Location: [facility];  Service: General;  Laterality: N/A;  . Billroth ii procedure       [place], first 2000, 2005/2006.  . Colonoscopy      in [place]  . Cholecystectomy    . Colonoscopy with propofol N/A 05/20/2013    Dr.Rourk- inadequate prep, normal appearing rectum, grossly normal colon aside from pancolonic diverticula, normal terminal ileum bx= unremarkable colonic mucosa.  Due for early interval 2016.   Marland Kitchen Esophagogastroduodenoscopy (egd) with propofol N/A 05/20/2013    Dr.Rourk- s/p prior gastric surgery with normal esophagus, residual gastric mucosa and patent efferent limb  . Esophageal biopsy  05/20/2013    Procedure: BIOPSIES OF ASCENDING AND SIGMOID COLON;  Surgeon: Daneil Dolin, MD;  Location: [facility];  Service: Endoscopy;;  . Wound exploration Right 06/24/2013    Procedure: exploration of traumatic wound right wrist;  Surgeon: Tennis Must, MD;  Location: [place];  Service: Orthopedics;  Laterality:  Right;  . Tendon repar Right     wrist  . Adrenal turmor removal    . Esophagogastroduodenoscopy (egd) with propofol N/A 02/03/2014    Dr. Gala Romney:  s/p hemigastrectomy with retained gastric contents. Residual gastric mucosa and efferent limb appeared normal otherwise. Query gastroparesis.   . Colonoscopy with propofol N/A 04/26/2014    GUY:QIHKVQ ileum/one colon polyp removed/moderate pan-colonic diverticulosis/large internal hemorrhoids  . Esophagogastroduodenoscopy (egd) with propofol N/A 04/26/2014    QVZ:DGLOVFIE'P ring/small HH/mild non-erosive gasrtitis/normal anastomosis  . Esophageal biopsy  04/26/2014    Procedure: BIOPSIES;  Surgeon: Danie Binder, MD;  Location: [facility];  Service: Endoscopy;;   Social History:  reports that she has been smoking Cigarettes.  She has a 8.75 pack-year smoking history. She has never used smokeless tobacco. She reports that she drinks about 7.2 oz of alcohol per week. She reports that she does not use illicit drugs. She reports living at home with her parents. She reports independence with her ADLs. She reports drinking 5-6 beers daily Allergies  Allergen Reactions  . Codeine Hives  . Morphine And Related Hives    gastritis    Family History  Problem Relation Age of Onset  . Brain cancer Son   . Schizophrenia Son   . Cancer Son     brain  . Breast cancer Maternal Aunt   . Bipolar disorder Maternal Aunt   . Drug abuse Maternal Aunt   . Colon cancer Maternal Grandmother     late 55s, early 35s  . Lung cancer Father   . Cancer Father     mets  . Liver disease Neg Hx   . Drug abuse Mother   . Drug abuse Sister   . Drug abuse Brother   . Bipolar disorder Paternal Grandfather   . Bipolar disorder Cousin      Prior to Admission medications   Medication Sig Start Date End Date Taking? Authorizing Provider  ARIPiprazole (ABILIFY) 5 MG tablet Take 1 tablet (5 mg total) by mouth 2 (two) times daily. 07/16/13  Yes Cloria Spring, MD  escitalopram  (LEXAPRO) 10 MG tablet Take 10 mg by mouth daily.   Yes Historical Provider, MD  fluticasone (FLONASE) 50 MCG/ACT nasal spray Place 1 spray into both nostrils daily.   Yes Historical Provider, MD  hydrocortisone (ANUSOL-HC) 2.5 % rectal cream Place 1 application rectally 2 (two) times daily. 05/17/14  Yes Orvil Feil, NP  ibuprofen (ADVIL,MOTRIN) 200 MG tablet Take 400 mg by mouth every 6 (six) hours as needed.   Yes Historical Provider, MD  lisinopril (PRINIVIL,ZESTRIL) 10 MG tablet Take 10 mg by mouth daily.   Yes Historical Provider, MD  metoCLOPramide (REGLAN) 10 MG tablet Take 1 tablet (10 mg total) by mouth 4 (four) times daily -  before meals and at bedtime. 04/06/14  Yes Lezlie Octave Aryona Sill, NP  mupirocin ointment (BACTROBAN) 2 % Place into the nose 2 (two) times daily. Patient  taking differently: Place 1 application into the nose 2 (two) times daily.  12/13/13  Yes Maryann Mikhail, DO  ondansetron (ZOFRAN) 8 MG tablet Take 1 tablet (8 mg total) by mouth every 8 (eight) hours as needed for nausea or vomiting. 09/04/14  Yes Daleen Bo, MD  pantoprazole (PROTONIX) 40 MG tablet Take 1 tablet (40 mg total) by mouth 2 (two) times daily before a meal. 09/04/14  Yes Daleen Bo, MD  sucralfate (CARAFATE) 1 G tablet Take 1 g by mouth 4 (four) times daily.   Yes Historical Provider, MD   Physical Exam: Filed Vitals:   11/01/14 1345 11/01/14 1400 11/01/14 1430 11/01/14 1448  BP:  120/95 115/91 117/80  Pulse: 88 96 91 92  Temp:   98.2 F (36.8 C) 98.6 F (37 C)  TempSrc:    Oral  Resp:    18  Height:    5\' 3"  (1.6 m)  Weight:    60.328 kg (133 lb)  SpO2: 97% 97% 98% 100%    Wt Readings from Last 3 Encounters:  11/01/14 60.328 kg (133 lb)  09/04/14 60.328 kg (133 lb)  06/14/14 59.421 kg (131 lb)    General:  Appears calm and comfortable Eyes: PERRL, normal lids, irises & conjunctiva ENT: grossly normal hearing, mucous membrane of her mouth is pink slightly dry very poor dentition Neck: no  LAD, masses or thyromegaly Cardiovascular: RRR, no m/r/g. No LE edema. Telemetry: SR, no arrhythmias  Respiratory: CTA bilaterally, no w/r/r. Normal respiratory effort. Abdomen: soft, ntnd positive bowel sounds no guarding or rebounding. Healing scar and small open wound can amount of drainage no odor Skin: no rash or induration seen on limited exam Musculoskeletal: grossly normal tone BUE/BLE joints without swelling/erythema Psychiatric: grossly normal mood and affect, speech fluent and appropriate Neurologic: Speech clear facial symmetry. Moves all extremities. Left grip 4 out of 5 right grip 5 out of 5 lower extremity strength on the left 3 out of 5 right 4 out of 5           Labs on Admission:  Basic Metabolic Panel:  Recent Labs Lab 11/01/14 0839  NA 141  K 3.1*  CL 104  CO2 25  GLUCOSE 102*  BUN <5*  CREATININE 0.39*  CALCIUM 8.9  MG 1.7   Liver Function Tests:  Recent Labs Lab 11/01/14 0839  AST 54*  ALT 29  ALKPHOS 92  BILITOT 0.7  PROT 7.4  ALBUMIN 3.6   No results for input(s): LIPASE, AMYLASE in the last 168 hours. No results for input(s): AMMONIA in the last 168 hours. CBC:  Recent Labs Lab 11/01/14 0839  WBC 4.1  NEUTROABS 2.2  HGB 11.4*  HCT 32.6*  MCV 89.1  PLT 195   Cardiac Enzymes: No results for input(s): CKTOTAL, CKMB, CKMBINDEX, TROPONINI in the last 168 hours.  BNP (last 3 results)  Recent Labs  03/14/14 1054  BNP 13.0    ProBNP (last 3 results) No results for input(s): PROBNP in the last 8760 hours.  CBG: No results for input(s): GLUCAP in the last 168 hours.  Radiological Exams on Admission: Mr [name] Contrast  11/01/2014   CLINICAL DATA:  Weakness and confusion with multiple falls. Symptom duration approximately 1 month. History of urinary incontinence.  EXAM: MRI HEAD WITHOUT AND WITH CONTRAST  TECHNIQUE: Multiplanar, multiecho pulse sequences of the brain and surrounding structures were obtained without and with  intravenous contrast.  CONTRAST:  58mL MULTIHANCE GADOBENATE DIMEGLUMINE 529 MG/ML IV  SOLN  COMPARISON:  MRI cervical and thoracic spine reported separately.  FINDINGS: The patient was unable to remain motionless for the exam. Small or subtle lesions could be overlooked.  No evidence for acute infarction, hemorrhage, mass lesion, hydrocephalus, or extra-axial fluid. Mild atrophy. No appreciable white matter disease.  Partial empty sella. Pineal and cerebellar tonsils unremarkable. No upper cervical lesions.  Flow voids are maintained throughout the carotid, basilar, and vertebral arteries. There are no areas of chronic hemorrhage.  Post infusion, no abnormal enhancement of the brain or meninges.  Visualized calvarium, skull base, and upper cervical osseous structures unremarkable. Scalp and extracranial soft tissues, orbits, sinuses, and mastoids show no acute process.  IMPRESSION: Motion degraded examination demonstrating no acute intracranial findings. Mild atrophy is suggested which may be premature for the patient's age.  Partial empty sella, nonspecific.   Electronically Signed   By: Staci Righter M.D.   On: 11/01/2014 11:51   Mr Cervical Spine W Wo Contrast  11/01/2014   CLINICAL DATA:  Bilateral weakness. Multiple falls. Confusion. Initial encounter.  EXAM: MRI CERVICAL SPINE WITHOUT AND WITH CONTRAST  TECHNIQUE: Multiplanar and multiecho pulse sequences of the cervical spine, to include the craniocervical junction and cervicothoracic junction, were obtained according to standard protocol without and with intravenous contrast.  CONTRAST:  12 mL MULTIHANCE GADOBENATE DIMEGLUMINE 529 MG/ML IV SOLN  COMPARISON:  None.  FINDINGS: There is mild kyphosis about the C5-6 level due to loss of disc space height anteriorly. Vertebral body height is maintained. Mild degenerative endplate signal change is seen at C5-6. There is no worrisome marrow lesion. The craniocervical junction is normal and cervical cord signal  is normal. Imaged paraspinous structures are unremarkable.  C2-3: Shallow disc bulge to the right without central canal or foraminal stenosis.  C3-4: Very shallow central protrusion without central canal or foraminal stenosis.  C4-5: Minimal disc bulge without central canal or foraminal stenosis.  C5-6: A disc osteophyte complex effaces the ventral thecal sac but the central canal appears open. Mild left foraminal narrowing is noted. The right foramen is patent.  C6-7: Very shallow left paracentral protrusion without central canal or foraminal stenosis.  C7-T1:  Negative.  IMPRESSION: No finding to explain the patient's symptoms.  Shallow disc bulge at C5-6 effaces the ventral thecal sac causing mild central canal narrowing. There is no cord deformity. The left foramen is also mildly narrowed at this level.  Very shallow left paracentral protrusion C6-7 without central canal or foraminal stenosis.   Electronically Signed   By: Inge Rise M.D.   On: 11/01/2014 11:50   Mr Thoracic Spine W Wo Contrast  11/01/2014   CLINICAL DATA:  Bilateral weakness. Multiple falls. Confusion. Initial encounter.  EXAM: MRI THORACIC SPINE WITHOUT AND WITH CONTRAST  TECHNIQUE: Multiplanar and multiecho pulse sequences of the thoracic spine were obtained without and with intravenous contrast.  CONTRAST:  12 mL MULTIHANCE GADOBENATE DIMEGLUMINE 529 MG/ML IV SOLN  COMPARISON:  None.  FINDINGS: Vertebral body height, signal and alignment are maintained. The thoracic cord demonstrates normal size, shape and signal at all levels. The central canal and foramina are widely patent at all levels. No pathologic enhancement after contrast administration is identified. Imaged paraspinous structures are unremarkable.  IMPRESSION: Negative examination.  No finding to explain the patient's symptoms.   Electronically Signed   By: Inge Rise M.D.   On: 11/01/2014 11:54    EKG:   Assessment/Plan Principal Problem:   Ataxia: Etiology  unclear. She has had  several falls over the last several weeks but denies any acute injury. Family attributes falls to EtOH abuse. MRI brain C-spine and thoracic spine lumbar spine within the limits of normal. Notes indicate patient able to bear weight and walk a few steps while in the emergency department by gait unsteady. Check B-12 and TSH. Will request physical therapy for evaluation tomorrow.  Active Problems:  Hypokalemia: Likely related to decreased oral intake. Will replete and recheck. Will check magnesium level as well   Essential hypertension, benign: Controlled. Home medications include lisinopril and we will continue this.   Alcohol abuse: Patient reports wanting to stop. She reports trying on her own but "I get sick when I quit". Will initiate CIWA protocol and monitor. Social work consult    Bipolar disorder; appears stable at baseline. Will continue home medications.    Tobacco abuse: Cessation counseling offered.      Code Status: full DVT Prophylaxis: Family Communication: friend at bedside Disposition Plan: home when ready  Time spent: 58 minutes  [facility] Pager [phone]

## 2014-11-02 LAB — CBC
HCT: 27.8 % — ABNORMAL LOW (ref 36.0–46.0)
Hemoglobin: 9.4 g/dL — ABNORMAL LOW (ref 12.0–15.0)
MCH: 30.7 pg (ref 26.0–34.0)
MCHC: 33.8 g/dL (ref 30.0–36.0)
MCV: 90.8 fL (ref 78.0–100.0)
PLATELETS: 181 10*3/uL (ref 150–400)
RBC: 3.06 MIL/uL — AB (ref 3.87–5.11)
RDW: 16.2 % — ABNORMAL HIGH (ref 11.5–15.5)
WBC: 3.4 10*3/uL — AB (ref 4.0–10.5)

## 2014-11-02 LAB — COMPREHENSIVE METABOLIC PANEL
ALK PHOS: 71 U/L (ref 38–126)
ALT: 25 U/L (ref 14–54)
AST: 57 U/L — ABNORMAL HIGH (ref 15–41)
Albumin: 2.8 g/dL — ABNORMAL LOW (ref 3.5–5.0)
Anion gap: 4 — ABNORMAL LOW (ref 5–15)
BUN: <5 mg/dL — ABNORMAL LOW (ref 6–20)
CALCIUM: 8.5 mg/dL — AB (ref 8.9–10.3)
CO2: 27 mmol/L (ref 22–32)
CREATININE: 0.44 mg/dL (ref 0.44–1.00)
Chloride: 107 mmol/L (ref 101–111)
Glucose, Bld: 103 mg/dL — ABNORMAL HIGH (ref 65–99)
Potassium: 4 mmol/L (ref 3.5–5.1)
Sodium: 138 mmol/L (ref 135–145)
Total Bilirubin: 0.5 mg/dL (ref 0.3–1.2)
Total Protein: 5.8 g/dL — ABNORMAL LOW (ref 6.5–8.1)

## 2014-11-02 LAB — MRSA PCR SCREENING: MRSA BY PCR: NEGATIVE

## 2014-11-02 MED ORDER — CYANOCOBALAMIN 1000 MCG/ML IJ SOLN
1000.0000 ug | Freq: Once | INTRAMUSCULAR | Status: AC
  Administered 2014-11-02: 1000 ug via INTRAMUSCULAR
  Filled 2014-11-02: qty 1

## 2014-11-02 NOTE — Care Management Note (Signed)
Case Management Note  Patient Details  Name: ARINE FOLEY MRN: [id number] Date of Birth: 13-Mar-1966  Subjective/Objective:                  Pt admitted from home with ataxia. Pt lives with family and unsure if she will return home or rehab at discharge. Pt has been independent with ADL's prior to falling several days ago.  Action/Plan: PT eval pending. Will continue to follow for discharge planning needs.  Expected Discharge Date:                  Expected Discharge Plan:  [place]  In-House Referral:  Clinical Social Work  Discharge planning Services  CM Consult  Post Acute Care Choice:  Home Health Choice offered to:  Patient  DME Arranged:    DME Agency:     HH Arranged:    [place] Agency:     Status of Service:  In process, will continue to follow  Medicare Important Message Given:    Date Medicare IM Given:    Medicare IM give by:    Date Additional Medicare IM Given:    Additional Medicare Important Message give by:     If discussed at [place] of Stay Meetings, dates discussed:    Additional Comments:  Joylene Draft, RN 11/02/2014, 11:16 AM

## 2014-11-02 NOTE — Clinical Social Work Note (Signed)
Clinical Social Work Assessment  Patient Details  Name: Angel Price MRN: [id number] Date of Birth: 06-Apr-1965  Date of referral:  11/02/14               Reason for consult:  Facility Placement, Substance Use/ETOH Abuse                Permission sought to share information with:  Family Supports Permission granted to share information::  Yes, Verbal Permission Granted  Name::     Administrator, arts::     Relationship::  daughter  Contact Information:     Housing/Transportation Living arrangements for the past 2 months:  Single Family Home Source of Information:  Patient, Adult Children Patient Interpreter Needed:  None Criminal Activity/Legal Involvement Pertinent to Current Situation/Hospitalization:  No - Comment as needed Significant Relationships:  Adult Children, Parents Lives with:  Adult Children, Parents Do you feel safe going back to the place where you live?  Yes Need for family participation in patient care:  Yes (Comment)  Care giving concerns:  Pt is independent at baseline.    Social Worker assessment / plan:  CSW met with pt and pt's daughter, Angel Price at bedside. Pt alert and oriented and reports that she lives with her parents and Angel Price. She describes her mother and daughter as her best support. Pt is on disability due to lupus. Pt explained that she fell on Friday and her daughter tried to convince her for several days to go to hospital before she agreed. She is independent at baseline, but now is requiring assist with ADLs. CSW discussed PT recommendation for SNF. She states she was aware of this already and talked to daughter about this. Both have decided plan will be to return home with around the clock assist from family. Pt is interested in home health. Will notify CM of decision. SNF list left in room.   Pt shared that she has diagnosis of schizoaffective disorder. She said that she had a suicide attempt over a year ago and had inpatient treatment. She had been  followed regularly by Dr. Harrington Challenger, but states that she decided that she did not need treatment or medication anymore and stopped several months ago. Pt recognizes that it would be beneficial to continue outpatient treatment and her daughter definitely agrees. She plans to make appointment for follow up soon. Pt states she was on Abilify and Lexapro before and this worked well for her.   CSW discussed ETOH use with pt. She was very open and shared that alcohol has been a problem for her. Pt generally drinks about 5-6 beers daily. She denies any drug use. Pt has tried to stop in the past, but reports that this is what needed to happen for her to be serious about it. Her family has encouraged her for some time to stop drinking and pt states they will hold her accountable. Pt has been to inpatient treatment many years ago, but is not interested in any inpatient/outpatient resources. She feels her family's support is all she will need. SBIRT completed and pt scored 22, indicating high risk. CSW provided some information and resources which pt accepted in case needed. She shared that she is tried of drinking and the way it makes her feel. Pt's daughter also agreed with pt's statements and her commitment to stop drinking. CSW will sign off, but can be reconsulted if needed.   Employment status:  Disabled (Comment on whether or not currently receiving Disability) Insurance information:  Medicare PT Recommendations:  [place] / Referral to community resources:  SBIRT, [place], Outpatient Substance Abuse Treatment Options  Patient/Family's Response to care:  Pt requests to return home with family support at d/c.   Patient/Family's Understanding of and Emotional Response to Diagnosis, Current Treatment, and Prognosis:  Pt and daughter report understanding of admission diagnosis and treatment plan. Both indicate that they are aware of effects of alcohol on health and admit  that this situation scared them. Pt states, "This is what needed to happen to get my attention to stop."   Emotional Assessment Appearance:  Appears stated age Attitude/Demeanor/Rapport:  Other (Cooperative) Affect (typically observed):  Pleasant Orientation:  Oriented to Self, Oriented to Place, Oriented to  Time, Oriented to Situation Alcohol / Substance use:  Alcohol Use Psych involvement (Current and /or in the community):  No (Comment)  Discharge Needs  Concerns to be addressed:  Discharge Planning Concerns Readmission within the last 30 days:  No Current discharge risk:  Substance Abuse Barriers to Discharge:  Continued Medical Work up   [employer], [employer], [place] 11/02/2014, 1:58 PM [phone]

## 2014-11-02 NOTE — Plan of Care (Signed)
Problem: Acute Rehab PT Goals(only PT should resolve) Goal: Patient Will Transfer Sit To/From Stand Pt will transfer sit to/from-stand with RW at [place] without loss-of-balance to demonstrate good safety awareness for independent mobility in home.     Goal: Pt Will Ambulate Pt will ambulate with RW at [place] using a step-through pattern and equal step length for a distances greater than 68ft to demonstrate the ability to perform safe household distance ambulation at discharge.

## 2014-11-02 NOTE — Evaluation (Signed)
Physical Therapy Evaluation Patient Details Name: Angel Price MRN: [id number] DOB: 05/16/1965 Today's Date: 11/02/2014   History of Present Illness  Angel Price is a 49yo black F c PMH: EtOH abuse, frequent gastro-paresis, anemia, schizoaffective disorder, hypertension, presents to the emergency Department chief complaint of inability to walk after fall 3 days ago. Pt reports collapsing onto knees about three days ago s strength to come back to feet. Pt reports gradual weakness and chronic achy pain in legs began a month ago. Pt reports she has been off HTN meds for ~2 months as she never was able to refill the script. Initial evaluation in the emergency department concerning for spinal cord abnormalities and/or acute brain process. She obtained an MRI of C-spine, thoracic spine and brain all of which were within the limits of normal. In spite of this she had difficulty walking and therefore was referred for admission. At admission, associated symptoms include bilateral lower extremity pain described as sharp constant with weightbearing located from hips to toes. Denies transience of Sx c positional changes. She denies headache, visual disturbances, numbness or tingling of extremities.   Clinical Impression  Pt is received semirecumbent in bed upon entry, awake, alert, and willing to participate. No acute distress noted.  Pt reports continued pain in BLE L>R, that is mildly improved, but remains relatively unchanged with positional changes. Pt is A&Ox3 and pleasant. Pt reports zero falls in the last 6 months with exception to most recent. Pt strength as screened by MMT moderately weak throughout the LUE, LLE, and L grip strength, pt's dominant side. Pt also presenting c focal area of weakness on R side including hip flexion and ankle DF. Neurological testing findings are absent spasticity, myoclonus, or hoffman's sign, however pt presenting with mild dysmetria in BUE L worse than R. Functional  strength is significantly limited at this time as noted during transfers and ambulation. Patient presenting with impairment of strength, coordination, balance, and activity tolerance, limiting ability to perform ADL and mobility tasks at  baseline level of function. Patient will benefit from skilled intervention to address the above impairments and limitations, in order to restore to prior level of function, improve patient safety upon discharge, and to decrease falls risk.       Follow Up Recommendations SNF    Equipment Recommendations  None recommended by PT    Recommendations for Other Services       Precautions / Restrictions Precautions Precautions: None Restrictions Weight Bearing Restrictions: No      Mobility  Bed Mobility Overal bed mobility: Modified Independent             General bed mobility comments: Uses BUE to manage BLE from supine to sitting EOB. Require additional time.   Transfers Overall transfer level: Needs assistance Equipment used: Rolling walker (2 wheeled) Transfers: Sit to/from Stand Sit to Stand: Min guard         General transfer comment: able to performed c additional time and moderate use of BUE.   Ambulation/Gait Ambulation/Gait assistance: Min guard Ambulation Distance (Feet): 8 Feet Assistive device: Rolling walker (2 wheeled)     Gait velocity interpretation: <1.8 ft/sec, indicative of risk for recurrent falls General Gait Details: Very short shuffling steps. Reports that legs feel rubbery. Pt tachypnic after 6 feet, asks to sit.   Stairs            Wheelchair Mobility    Modified Rankin (Stroke Patients Only)       Balance Overall  balance assessment: Needs assistance Sitting-balance support: Feet supported;No upper extremity supported Sitting balance-Leahy Scale: Poor Sitting balance - Comments: does not withstand perturbation s falling to side BILAT.                      High level balance activites:  Backward walking               Pertinent Vitals/Pain Pain Assessment: 0-10 Pain Score: 7  Pain Location: BLE, L>R, thighs to ankles, posterior more than anterior.  Pain Descriptors / Indicators: Aching;Constant;Dull Pain Intervention(s): Limited activity within patient's tolerance;Monitored during session    Home Living Family/patient expects to be discharged to:: Private residence Living Arrangements: Parent;Children;Other relatives Available Help at Discharge: Family Type of Home: House Home Access: Stairs to enter Entrance Stairs-Rails: Psychiatric nurse of Steps: 3 Home Layout: One level Home Equipment: None Additional Comments: Previously indep in all mobility and ambulation.     Prior Function Level of Independence: Independent               Hand Dominance   Dominant Hand: Left    Extremity/Trunk Assessment   Upper Extremity Assessment: Generalized weakness;LUE deficits/detail (Rapid alternating movements are negative. )       LUE Deficits / Details: Grip strength is significantly impaired on L side, MMT of elbow flexion/extension is 3+/5 on L and 4+/5 on R. 5xFTN is 7s bilat c dysmetria bilat 50% worse on L.    Lower Extremity Assessment: Generalized weakness;RLE deficits/detail;LLE deficits/detail RLE Deficits / Details: Hp flexion 3/5, knee ext/flex 4+/5, ankle DF 2+/5.  LLE Deficits / Details: 3/5 strength in hip flexion, 2+/5 in ankle DF/PF, knee extension, knee flexion.   Cervical / Trunk Assessment: Other exceptions (significant trunk weakness bilat, unable to withstand moderate perturbation s failling over onto bed. )  Communication   Communication: No difficulties  Cognition Arousal/Alertness: Awake/alert Behavior During Therapy: WFL for tasks assessed/performed Overall Cognitive Status: Within Functional Limits for tasks assessed                      General Comments      Exercises        Assessment/Plan     PT Assessment Patient needs continued PT services  PT Diagnosis Difficulty walking;Abnormality of gait;Generalized weakness;Hemiplegia dominant side   PT Problem List Decreased strength;Decreased activity tolerance;Decreased balance;Decreased mobility;Decreased coordination  PT Treatment Interventions DME instruction;Gait training;Stair training;Functional mobility training;Therapeutic activities;Balance training;Therapeutic exercise   PT Goals (Current goals can be found in the Care Plan section) Acute Rehab PT Goals Patient Stated Goal: Regain ability to walk again.  PT Goal Formulation: With patient Time For Goal Achievement: 11/16/14 Potential to Achieve Goals: Good    Frequency Min 3X/week   Barriers to discharge Inaccessible home environment      Co-evaluation               End of Session Equipment Utilized During Treatment: Gait belt Activity Tolerance: Patient tolerated treatment well;Patient limited by fatigue Patient left: in bed;with bed alarm set;with call bell/phone within reach Nurse Communication: Other (comment)         Time: 1030-1055 PT Time Calculation (min) (ACUTE ONLY): 25 min   Charges:   PT Evaluation $Initial PT Evaluation Tier I: 1 Procedure     PT G Codes:        Nakesha Ebrahim C 11/11/14, 1:09 PM 1:16 PM  Etta Grandchild, PT, DPT  License # [id number]

## 2014-11-02 NOTE — Progress Notes (Signed)
Subjective: Patient was admitted yesterday due to difficulty to walk. She was falling. Her cervical and brain MRI was negative. Patient has history of alcohol abuse. She was also drinking before the episode.  Objective: Vital signs in last 24 hours: Temp:  [98.1 F (36.7 C)-98.6 F (37 C)] 98.5 F (36.9 C) (08/17 0627) Pulse Rate:  [87-96] 92 (08/16 1448) Resp:  [13-20] 18 (08/17 0627) BP: (103-121)/(77-95) 116/84 mmHg (08/17 0627) SpO2:  [97 %-100 %] 100 % (08/17 0627) Weight:  [60.328 kg (133 lb)] 60.328 kg (133 lb) (08/16 1448) Weight change:  Last BM Date: 10/31/14  Intake/Output from previous day: 08/16 0701 - 08/17 0700 In: 240 [P.O.:240] Out: 200 [Urine:200]  PHYSICAL EXAM General appearance: alert and no distress Resp: clear to auscultation bilaterally Cardio: S1, S2 normal GI: soft, non-tender; bowel sounds normal; no masses,  no organomegaly Extremities: extremities normal, atraumatic, no cyanosis or edema  Lab Results:  Results for orders placed or performed during the hospital encounter of 11/01/14 (from the past 48 hour(s))  Urinalysis, Routine w reflex microscopic (not at [facility])     Status: Abnormal   Collection Time: 11/01/14  8:00 AM  Result Value Ref Range   Color, Urine YELLOW YELLOW   APPearance CLEAR CLEAR   Specific Gravity, Urine 1.030 1.005 - 1.030   pH 5.5 5.0 - 8.0   Glucose, UA NEGATIVE NEGATIVE mg/dL   Hgb urine dipstick NEGATIVE NEGATIVE   Bilirubin Urine SMALL (A) NEGATIVE   Ketones, ur NEGATIVE NEGATIVE mg/dL   Protein, ur NEGATIVE NEGATIVE mg/dL   Urobilinogen, UA 0.2 0.0 - 1.0 mg/dL   Nitrite NEGATIVE NEGATIVE   Leukocytes, UA NEGATIVE NEGATIVE    Comment: MICROSCOPIC NOT DONE ON URINES WITH NEGATIVE PROTEIN, BLOOD, LEUKOCYTES, NITRITE, OR GLUCOSE <1000 mg/dL.  Vitamin B12     Status: Abnormal   Collection Time: 11/01/14  8:37 AM  Result Value Ref Range   Vitamin B-12 135 (L) 180 - 914 pg/mL    Comment: (NOTE) This assay is not  validated for testing neonatal or myeloproliferative syndrome specimens for Vitamin B12 levels. Performed at [facility]   CBC with Differential/Platelet     Status: Abnormal   Collection Time: 11/01/14  8:39 AM  Result Value Ref Range   WBC 4.1 4.0 - 10.5 K/uL   RBC 3.66 (L) 3.87 - 5.11 MIL/uL   Hemoglobin 11.4 (L) 12.0 - 15.0 g/dL   HCT 32.6 (L) 36.0 - 46.0 %   MCV 89.1 78.0 - 100.0 fL   MCH 31.1 26.0 - 34.0 pg   MCHC 35.0 30.0 - 36.0 g/dL   RDW 16.1 (H) 11.5 - 15.5 %   Platelets 195 150 - 400 K/uL   Neutrophils Relative % 54 43 - 77 %   Neutro Abs 2.2 1.7 - 7.7 K/uL   Lymphocytes Relative 31 12 - 46 %   Lymphs Abs 1.3 0.7 - 4.0 K/uL   Monocytes Relative 14 (H) 3 - 12 %   Monocytes Absolute 0.6 0.1 - 1.0 K/uL   Eosinophils Relative 1 0 - 5 %   Eosinophils Absolute 0.0 0.0 - 0.7 K/uL   Basophils Relative 0 0 - 1 %   Basophils Absolute 0.0 0.0 - 0.1 K/uL  Comprehensive metabolic panel     Status: Abnormal   Collection Time: 11/01/14  8:39 AM  Result Value Ref Range   Sodium 141 135 - 145 mmol/L   Potassium 3.1 (L) 3.5 - 5.1 mmol/L   Chloride  104 101 - 111 mmol/L   CO2 25 22 - 32 mmol/L   Glucose, Bld 102 (H) 65 - 99 mg/dL   BUN <5 (L) 6 - 20 mg/dL   Creatinine, Ser 0.39 (L) 0.44 - 1.00 mg/dL   Calcium 8.9 8.9 - 10.3 mg/dL   Total Protein 7.4 6.5 - 8.1 g/dL   Albumin 3.6 3.5 - 5.0 g/dL   AST 54 (H) 15 - 41 U/L   ALT 29 14 - 54 U/L   Alkaline Phosphatase 92 38 - 126 U/L   Total Bilirubin 0.7 0.3 - 1.2 mg/dL   GFR calc non Af Amer >60 >60 mL/min   GFR calc Af Amer >60 >60 mL/min    Comment: (NOTE) The eGFR has been calculated using the CKD EPI equation. This calculation has not been validated in all clinical situations. eGFR's persistently <60 mL/min signify possible Chronic Kidney Disease.    Anion gap 12 5 - 15  Magnesium     Status: None   Collection Time: 11/01/14  8:39 AM  Result Value Ref Range   Magnesium 1.7 1.7 - 2.4 mg/dL  Urine rapid drug screen  (hosp performed)     Status: None   Collection Time: 11/01/14  9:00 AM  Result Value Ref Range   Opiates NONE DETECTED NONE DETECTED   Cocaine NONE DETECTED NONE DETECTED   Benzodiazepines NONE DETECTED NONE DETECTED   Amphetamines NONE DETECTED NONE DETECTED   Tetrahydrocannabinol NONE DETECTED NONE DETECTED   Barbiturates NONE DETECTED NONE DETECTED    Comment:        DRUG SCREEN FOR MEDICAL PURPOSES ONLY.  IF CONFIRMATION IS NEEDED FOR ANY PURPOSE, NOTIFY LAB WITHIN 5 DAYS.        LOWEST DETECTABLE LIMITS FOR URINE DRUG SCREEN Drug Class       Cutoff (ng/mL) Amphetamine      1000 Barbiturate      200 Benzodiazepine   852 Tricyclics       778 Opiates          300 Cocaine          300 THC              50   TSH     Status: None   Collection Time: 11/01/14  3:36 PM  Result Value Ref Range   TSH 0.570 0.350 - 4.500 uIU/mL  MRSA PCR Screening     Status: None   Collection Time: 11/01/14  9:20 PM  Result Value Ref Range   MRSA by PCR NEGATIVE NEGATIVE    Comment:        The GeneXpert MRSA Assay (FDA approved for NASAL specimens only), is one component of a comprehensive MRSA colonization surveillance program. It is not intended to diagnose MRSA infection nor to guide or monitor treatment for MRSA infections.   Comprehensive metabolic panel     Status: Abnormal   Collection Time: 11/02/14  6:15 AM  Result Value Ref Range   Sodium 138 135 - 145 mmol/L   Potassium 4.0 3.5 - 5.1 mmol/L    Comment: DELTA CHECK NOTED   Chloride 107 101 - 111 mmol/L   CO2 27 22 - 32 mmol/L   Glucose, Bld 103 (H) 65 - 99 mg/dL   BUN <5 (L) 6 - 20 mg/dL   Creatinine, Ser 0.44 0.44 - 1.00 mg/dL   Calcium 8.5 (L) 8.9 - 10.3 mg/dL   Total Protein 5.8 (L) 6.5 - 8.1 g/dL   Albumin  2.8 (L) 3.5 - 5.0 g/dL   AST 57 (H) 15 - 41 U/L   ALT 25 14 - 54 U/L   Alkaline Phosphatase 71 38 - 126 U/L   Total Bilirubin 0.5 0.3 - 1.2 mg/dL   GFR calc non Af Amer >60 >60 mL/min   GFR calc Af Amer >60 >60  mL/min    Comment: (NOTE) The eGFR has been calculated using the CKD EPI equation. This calculation has not been validated in all clinical situations. eGFR's persistently <60 mL/min signify possible Chronic Kidney Disease.    Anion gap 4 (L) 5 - 15  CBC     Status: Abnormal   Collection Time: 11/02/14  6:15 AM  Result Value Ref Range   WBC 3.4 (L) 4.0 - 10.5 K/uL   RBC 3.06 (L) 3.87 - 5.11 MIL/uL   Hemoglobin 9.4 (L) 12.0 - 15.0 g/dL   HCT 27.8 (L) 36.0 - 46.0 %   MCV 90.8 78.0 - 100.0 fL   MCH 30.7 26.0 - 34.0 pg   MCHC 33.8 30.0 - 36.0 g/dL   RDW 16.2 (H) 11.5 - 15.5 %   Platelets 181 150 - 400 K/uL    ABGS No results for input(s): PHART, PO2ART, TCO2, HCO3 in the last 72 hours.  Invalid input(s): PCO2 CULTURES Recent Results (from the past 240 hour(s))  MRSA PCR Screening     Status: None   Collection Time: 11/01/14  9:20 PM  Result Value Ref Range Status   MRSA by PCR NEGATIVE NEGATIVE Final    Comment:        The GeneXpert MRSA Assay (FDA approved for NASAL specimens only), is one component of a comprehensive MRSA colonization surveillance program. It is not intended to diagnose MRSA infection nor to guide or monitor treatment for MRSA infections.    Studies/Results: Mr [name] Contrast  11/01/2014   CLINICAL DATA:  Weakness and confusion with multiple falls. Symptom duration approximately 1 month. History of urinary incontinence.  EXAM: MRI HEAD WITHOUT AND WITH CONTRAST  TECHNIQUE: Multiplanar, multiecho pulse sequences of the brain and surrounding structures were obtained without and with intravenous contrast.  CONTRAST:  36m MULTIHANCE GADOBENATE DIMEGLUMINE 529 MG/ML IV SOLN  COMPARISON:  MRI cervical and thoracic spine reported separately.  FINDINGS: The patient was unable to remain motionless for the exam. Small or subtle lesions could be overlooked.  No evidence for acute infarction, hemorrhage, mass lesion, hydrocephalus, or extra-axial fluid. Mild  atrophy. No appreciable white matter disease.  Partial empty sella. Pineal and cerebellar tonsils unremarkable. No upper cervical lesions.  Flow voids are maintained throughout the carotid, basilar, and vertebral arteries. There are no areas of chronic hemorrhage.  Post infusion, no abnormal enhancement of the brain or meninges.  Visualized calvarium, skull base, and upper cervical osseous structures unremarkable. Scalp and extracranial soft tissues, orbits, sinuses, and mastoids show no acute process.  IMPRESSION: Motion degraded examination demonstrating no acute intracranial findings. Mild atrophy is suggested which may be premature for the patient's age.  Partial empty sella, nonspecific.   Electronically Signed   By: JStaci RighterM.D.   On: 11/01/2014 11:51   Mr Cervical Spine W Wo Contrast  11/01/2014   CLINICAL DATA:  Bilateral weakness. Multiple falls. Confusion. Initial encounter.  EXAM: MRI CERVICAL SPINE WITHOUT AND WITH CONTRAST  TECHNIQUE: Multiplanar and multiecho pulse sequences of the cervical spine, to include the craniocervical junction and cervicothoracic junction, were obtained according to standard protocol without and with  intravenous contrast.  CONTRAST:  12 mL MULTIHANCE GADOBENATE DIMEGLUMINE 529 MG/ML IV SOLN  COMPARISON:  None.  FINDINGS: There is mild kyphosis about the C5-6 level due to loss of disc space height anteriorly. Vertebral body height is maintained. Mild degenerative endplate signal change is seen at C5-6. There is no worrisome marrow lesion. The craniocervical junction is normal and cervical cord signal is normal. Imaged paraspinous structures are unremarkable.  C2-3: Shallow disc bulge to the right without central canal or foraminal stenosis.  C3-4: Very shallow central protrusion without central canal or foraminal stenosis.  C4-5: Minimal disc bulge without central canal or foraminal stenosis.  C5-6: A disc osteophyte complex effaces the ventral thecal sac but the  central canal appears open. Mild left foraminal narrowing is noted. The right foramen is patent.  C6-7: Very shallow left paracentral protrusion without central canal or foraminal stenosis.  C7-T1:  Negative.  IMPRESSION: No finding to explain the patient's symptoms.  Shallow disc bulge at C5-6 effaces the ventral thecal sac causing mild central canal narrowing. There is no cord deformity. The left foramen is also mildly narrowed at this level.  Very shallow left paracentral protrusion C6-7 without central canal or foraminal stenosis.   Electronically Signed   By: Inge Rise M.D.   On: 11/01/2014 11:50   Mr Thoracic Spine W Wo Contrast  11/01/2014   CLINICAL DATA:  Bilateral weakness. Multiple falls. Confusion. Initial encounter.  EXAM: MRI THORACIC SPINE WITHOUT AND WITH CONTRAST  TECHNIQUE: Multiplanar and multiecho pulse sequences of the thoracic spine were obtained without and with intravenous contrast.  CONTRAST:  12 mL MULTIHANCE GADOBENATE DIMEGLUMINE 529 MG/ML IV SOLN  COMPARISON:  None.  FINDINGS: Vertebral body height, signal and alignment are maintained. The thoracic cord demonstrates normal size, shape and signal at all levels. The central canal and foramina are widely patent at all levels. No pathologic enhancement after contrast administration is identified. Imaged paraspinous structures are unremarkable.  IMPRESSION: Negative examination.  No finding to explain the patient's symptoms.   Electronically Signed   By: Inge Rise M.D.   On: 11/01/2014 11:54    Medications: I have reviewed the patient's current medications.  Assesment:   Principal Problem:   Ataxia Active Problems:   Bipolar disorder   Tobacco abuse   Alcohol abuse   Essential hypertension, benign   Hypokalemia    Plan:  Medications reviewed Continue current treatment  Physical therapy Neurology consult.    LOS: 1 day   Lindbergh Winkles 11/02/2014, 8:06 AM

## 2014-11-02 NOTE — Progress Notes (Signed)
Initial Nutrition Assessment  DOCUMENTATION CODES:  Not applicable  INTERVENTION:  Recommend Thiamin/Folate as well as general multivitamin with minerals  Magic cup with Dinner, each supplement provides 290 kcal and 9 grams of protein  NUTRITION DIAGNOSIS:  Predicted suboptimal nutrient intake related to other ETOH abuse as evidenced by reportedly drinking 5-6 beers each day and only eating small bites of food  GOAL:  Patient will meet greater than or equal to 90% of their needs  MONITOR:  PO intake, Supplement acceptance, Labs, Weight trends  REASON FOR ASSESSMENT:  Malnutrition Screening Tool    ASSESSMENT:  49 y.o. female PMHx EtOH abuse (5-6 beers per day), gastroparesis, lupus, anxiety/depression anemia, schizoaffective disorder, htn who presents to the ED with chief complaint of inability to walk for 3 days  Patient reports that she has had a sporadic appetite for years. Some days she has an appetite, other days she does not. She also reports having chronic nausea, vomiting and diarrhea. These symptoms do not have any aggravating factors, rather "they just happen regardless".   She has a history of gastroparesis, but does not follow any certain diet. Briefly discussed that usually fat/fiber aggravate gastroparesis. However, She says she has noticed that only breads cause her some abdominal pains. In general, she manages her gastroparesis by eating very small amounts throughout the day. She states a sandwich can last her 2 days. She did not take any vitamin/minerals or supplements at home.   She reports that her weight loss comes and goes. It has been happening over years. She said her weight 6 months ago was 140 lbs, though this is not consistent with EPIC documentation. She actually would appear to be gaining weight over the last year.   Talked about the importance of protein in acute illness/injury. She was agreeable to [employer].   Diet Order:  Diet regular Room service  appropriate?: Yes; Fluid consistency:: Thin  Skin:  Ecchymotic, dry  Last BM:  8/15  Height:  Ht Readings from Last 1 Encounters:  11/01/14 5\' 3"  (1.6 m)   Weight:  Wt Readings from Last 1 Encounters:  11/01/14 133 lb (60.328 kg)   Wt Readings from Last 10 Encounters:  11/01/14 133 lb (60.328 kg)  09/04/14 133 lb (60.328 kg)  06/14/14 131 lb (59.421 kg)  05/22/14 131 lb (59.421 kg)  04/24/14 131 lb (59.421 kg)  04/06/14 132 lb 4.8 oz (60.011 kg)  04/01/14 122 lb 9.6 oz (55.611 kg)  03/14/14 136 lb (61.689 kg)  03/02/14 129 lb (58.514 kg)  02/02/14 129 lb (58.514 kg)    Ideal Body Weight:  52.3 kg  BMI:  Body mass index is 23.57 kg/(m^2).  Estimated Nutritional Needs:  Kcal:  1650-1800 (27-30 kcal/kg) Protein:  60-72 (1-1.2 g/kg BW) Fluid:  1.7-1.8 liters  EDUCATION NEEDS:  No nutrition education needs identified at this time  (though needs substance abuse ed)  Burtis Junes RD, LDN Nutrition Pager: [phone] 11/02/2014 11:38 AM

## 2014-11-03 MED ORDER — VITAMIN B-1 100 MG PO TABS
100.0000 mg | ORAL_TABLET | Freq: Every day | ORAL | Status: DC
  Administered 2014-11-03 – 2014-11-04 (×2): 100 mg via ORAL
  Filled 2014-11-03 (×2): qty 1

## 2014-11-03 NOTE — Plan of Care (Signed)
Problem: Phase I Progression Outcomes Goal: Pain controlled with appropriate interventions Outcome: Progressing Pt stated her pain was coming down from the beginning of shift. She states she is generally always in pain, and is comfortable right now. Pt resting in bed. Goal: OOB as tolerated unless otherwise ordered Outcome: Progressing Pt has been getting up and using the bedside commode.  Goal: Voiding-avoid urinary catheter unless indicated Outcome: Completed/Met Date Met:  11/03/14 Pt has been having good output. See flowsheet. Goal: Hemodynamically stable Outcome: Progressing See flowsheet. All vitals remain within defined limits.

## 2014-11-03 NOTE — Progress Notes (Signed)
Subjective: Patient feels slightly better today. She is trying to walk but remained unstable on her feet.  Objective: Vital signs in last 24 hours: Temp:  [98.1 F (36.7 C)-98.8 F (37.1 C)] 98.2 F (36.8 C) (08/18 0647) Pulse Rate:  [67-73] 70 (08/18 0647) Resp:  [18-20] 20 (08/17 2019) BP: (112-129)/(75-93) 129/93 mmHg (08/18 0647) SpO2:  [98 %-100 %] 98 % (08/18 0647) Weight change:  Last BM Date: 11/02/14  Intake/Output from previous day: 08/17 0701 - 08/18 0700 In: 2088.8 [P.O.:480; I.V.:1608.8] Out: 2000 [Urine:2000]  PHYSICAL EXAM General appearance: alert and no distress Resp: clear to auscultation bilaterally Cardio: S1, S2 normal GI: soft, non-tender; bowel sounds normal; no masses,  no organomegaly Extremities: extremities normal, atraumatic, no cyanosis or edema  Lab Results:  Results for orders placed or performed during the hospital encounter of 11/01/14 (from the past 48 hour(s))  Urinalysis, Routine w reflex microscopic (not at [facility])     Status: Abnormal   Collection Time: 11/01/14  8:00 AM  Result Value Ref Range   Color, Urine YELLOW YELLOW   APPearance CLEAR CLEAR   Specific Gravity, Urine 1.030 1.005 - 1.030   pH 5.5 5.0 - 8.0   Glucose, UA NEGATIVE NEGATIVE mg/dL   Hgb urine dipstick NEGATIVE NEGATIVE   Bilirubin Urine SMALL (A) NEGATIVE   Ketones, ur NEGATIVE NEGATIVE mg/dL   Protein, ur NEGATIVE NEGATIVE mg/dL   Urobilinogen, UA 0.2 0.0 - 1.0 mg/dL   Nitrite NEGATIVE NEGATIVE   Leukocytes, UA NEGATIVE NEGATIVE    Comment: MICROSCOPIC NOT DONE ON URINES WITH NEGATIVE PROTEIN, BLOOD, LEUKOCYTES, NITRITE, OR GLUCOSE <1000 mg/dL.  Vitamin B12     Status: Abnormal   Collection Time: 11/01/14  8:37 AM  Result Value Ref Range   Vitamin B-12 135 (L) 180 - 914 pg/mL    Comment: (NOTE) This assay is not validated for testing neonatal or myeloproliferative syndrome specimens for Vitamin B12 levels. Performed at [facility]   CBC with  Differential/Platelet     Status: Abnormal   Collection Time: 11/01/14  8:39 AM  Result Value Ref Range   WBC 4.1 4.0 - 10.5 K/uL   RBC 3.66 (L) 3.87 - 5.11 MIL/uL   Hemoglobin 11.4 (L) 12.0 - 15.0 g/dL   HCT 32.6 (L) 36.0 - 46.0 %   MCV 89.1 78.0 - 100.0 fL   MCH 31.1 26.0 - 34.0 pg   MCHC 35.0 30.0 - 36.0 g/dL   RDW 16.1 (H) 11.5 - 15.5 %   Platelets 195 150 - 400 K/uL   Neutrophils Relative % 54 43 - 77 %   Neutro Abs 2.2 1.7 - 7.7 K/uL   Lymphocytes Relative 31 12 - 46 %   Lymphs Abs 1.3 0.7 - 4.0 K/uL   Monocytes Relative 14 (H) 3 - 12 %   Monocytes Absolute 0.6 0.1 - 1.0 K/uL   Eosinophils Relative 1 0 - 5 %   Eosinophils Absolute 0.0 0.0 - 0.7 K/uL   Basophils Relative 0 0 - 1 %   Basophils Absolute 0.0 0.0 - 0.1 K/uL  Comprehensive metabolic panel     Status: Abnormal   Collection Time: 11/01/14  8:39 AM  Result Value Ref Range   Sodium 141 135 - 145 mmol/L   Potassium 3.1 (L) 3.5 - 5.1 mmol/L   Chloride 104 101 - 111 mmol/L   CO2 25 22 - 32 mmol/L   Glucose, Bld 102 (H) 65 - 99 mg/dL   BUN <5 (  L) 6 - 20 mg/dL   Creatinine, Ser 0.39 (L) 0.44 - 1.00 mg/dL   Calcium 8.9 8.9 - 10.3 mg/dL   Total Protein 7.4 6.5 - 8.1 g/dL   Albumin 3.6 3.5 - 5.0 g/dL   AST 54 (H) 15 - 41 U/L   ALT 29 14 - 54 U/L   Alkaline Phosphatase 92 38 - 126 U/L   Total Bilirubin 0.7 0.3 - 1.2 mg/dL   GFR calc non Af Amer >60 >60 mL/min   GFR calc Af Amer >60 >60 mL/min    Comment: (NOTE) The eGFR has been calculated using the CKD EPI equation. This calculation has not been validated in all clinical situations. eGFR's persistently <60 mL/min signify possible Chronic Kidney Disease.    Anion gap 12 5 - 15  Magnesium     Status: None   Collection Time: 11/01/14  8:39 AM  Result Value Ref Range   Magnesium 1.7 1.7 - 2.4 mg/dL  Urine rapid drug screen (hosp performed)     Status: None   Collection Time: 11/01/14  9:00 AM  Result Value Ref Range   Opiates NONE DETECTED NONE DETECTED    Cocaine NONE DETECTED NONE DETECTED   Benzodiazepines NONE DETECTED NONE DETECTED   Amphetamines NONE DETECTED NONE DETECTED   Tetrahydrocannabinol NONE DETECTED NONE DETECTED   Barbiturates NONE DETECTED NONE DETECTED    Comment:        DRUG SCREEN FOR MEDICAL PURPOSES ONLY.  IF CONFIRMATION IS NEEDED FOR ANY PURPOSE, NOTIFY LAB WITHIN 5 DAYS.        LOWEST DETECTABLE LIMITS FOR URINE DRUG SCREEN Drug Class       Cutoff (ng/mL) Amphetamine      1000 Barbiturate      200 Benzodiazepine   201 Tricyclics       007 Opiates          300 Cocaine          300 THC              50   TSH     Status: None   Collection Time: 11/01/14  3:36 PM  Result Value Ref Range   TSH 0.570 0.350 - 4.500 uIU/mL  MRSA PCR Screening     Status: None   Collection Time: 11/01/14  9:20 PM  Result Value Ref Range   MRSA by PCR NEGATIVE NEGATIVE    Comment:        The GeneXpert MRSA Assay (FDA approved for NASAL specimens only), is one component of a comprehensive MRSA colonization surveillance program. It is not intended to diagnose MRSA infection nor to guide or monitor treatment for MRSA infections.   Comprehensive metabolic panel     Status: Abnormal   Collection Time: 11/02/14  6:15 AM  Result Value Ref Range   Sodium 138 135 - 145 mmol/L   Potassium 4.0 3.5 - 5.1 mmol/L    Comment: DELTA CHECK NOTED   Chloride 107 101 - 111 mmol/L   CO2 27 22 - 32 mmol/L   Glucose, Bld 103 (H) 65 - 99 mg/dL   BUN <5 (L) 6 - 20 mg/dL   Creatinine, Ser 0.44 0.44 - 1.00 mg/dL   Calcium 8.5 (L) 8.9 - 10.3 mg/dL   Total Protein 5.8 (L) 6.5 - 8.1 g/dL   Albumin 2.8 (L) 3.5 - 5.0 g/dL   AST 57 (H) 15 - 41 U/L   ALT 25 14 - 54 U/L   Alkaline Phosphatase  71 38 - 126 U/L   Total Bilirubin 0.5 0.3 - 1.2 mg/dL   GFR calc non Af Amer >60 >60 mL/min   GFR calc Af Amer >60 >60 mL/min    Comment: (NOTE) The eGFR has been calculated using the CKD EPI equation. This calculation has not been validated in all  clinical situations. eGFR's persistently <60 mL/min signify possible Chronic Kidney Disease.    Anion gap 4 (L) 5 - 15  CBC     Status: Abnormal   Collection Time: 11/02/14  6:15 AM  Result Value Ref Range   WBC 3.4 (L) 4.0 - 10.5 K/uL   RBC 3.06 (L) 3.87 - 5.11 MIL/uL   Hemoglobin 9.4 (L) 12.0 - 15.0 g/dL   HCT 27.8 (L) 36.0 - 46.0 %   MCV 90.8 78.0 - 100.0 fL   MCH 30.7 26.0 - 34.0 pg   MCHC 33.8 30.0 - 36.0 g/dL   RDW 16.2 (H) 11.5 - 15.5 %   Platelets 181 150 - 400 K/uL    ABGS No results for input(s): PHART, PO2ART, TCO2, HCO3 in the last 72 hours.  Invalid input(s): PCO2 CULTURES Recent Results (from the past 240 hour(s))  MRSA PCR Screening     Status: None   Collection Time: 11/01/14  9:20 PM  Result Value Ref Range Status   MRSA by PCR NEGATIVE NEGATIVE Final    Comment:        The GeneXpert MRSA Assay (FDA approved for NASAL specimens only), is one component of a comprehensive MRSA colonization surveillance program. It is not intended to diagnose MRSA infection nor to guide or monitor treatment for MRSA infections.    Studies/Results: Mr [name] Contrast  11/01/2014   CLINICAL DATA:  Weakness and confusion with multiple falls. Symptom duration approximately 1 month. History of urinary incontinence.  EXAM: MRI HEAD WITHOUT AND WITH CONTRAST  TECHNIQUE: Multiplanar, multiecho pulse sequences of the brain and surrounding structures were obtained without and with intravenous contrast.  CONTRAST:  43m MULTIHANCE GADOBENATE DIMEGLUMINE 529 MG/ML IV SOLN  COMPARISON:  MRI cervical and thoracic spine reported separately.  FINDINGS: The patient was unable to remain motionless for the exam. Small or subtle lesions could be overlooked.  No evidence for acute infarction, hemorrhage, mass lesion, hydrocephalus, or extra-axial fluid. Mild atrophy. No appreciable white matter disease.  Partial empty sella. Pineal and cerebellar tonsils unremarkable. No upper cervical  lesions.  Flow voids are maintained throughout the carotid, basilar, and vertebral arteries. There are no areas of chronic hemorrhage.  Post infusion, no abnormal enhancement of the brain or meninges.  Visualized calvarium, skull base, and upper cervical osseous structures unremarkable. Scalp and extracranial soft tissues, orbits, sinuses, and mastoids show no acute process.  IMPRESSION: Motion degraded examination demonstrating no acute intracranial findings. Mild atrophy is suggested which may be premature for the patient's age.  Partial empty sella, nonspecific.   Electronically Signed   By: JStaci RighterM.D.   On: 11/01/2014 11:51   Mr Cervical Spine W Wo Contrast  11/01/2014   CLINICAL DATA:  Bilateral weakness. Multiple falls. Confusion. Initial encounter.  EXAM: MRI CERVICAL SPINE WITHOUT AND WITH CONTRAST  TECHNIQUE: Multiplanar and multiecho pulse sequences of the cervical spine, to include the craniocervical junction and cervicothoracic junction, were obtained according to standard protocol without and with intravenous contrast.  CONTRAST:  12 mL MULTIHANCE GADOBENATE DIMEGLUMINE 529 MG/ML IV SOLN  COMPARISON:  None.  FINDINGS: There is mild kyphosis about the C5-6  level due to loss of disc space height anteriorly. Vertebral body height is maintained. Mild degenerative endplate signal change is seen at C5-6. There is no worrisome marrow lesion. The craniocervical junction is normal and cervical cord signal is normal. Imaged paraspinous structures are unremarkable.  C2-3: Shallow disc bulge to the right without central canal or foraminal stenosis.  C3-4: Very shallow central protrusion without central canal or foraminal stenosis.  C4-5: Minimal disc bulge without central canal or foraminal stenosis.  C5-6: A disc osteophyte complex effaces the ventral thecal sac but the central canal appears open. Mild left foraminal narrowing is noted. The right foramen is patent.  C6-7: Very shallow left paracentral  protrusion without central canal or foraminal stenosis.  C7-T1:  Negative.  IMPRESSION: No finding to explain the patient's symptoms.  Shallow disc bulge at C5-6 effaces the ventral thecal sac causing mild central canal narrowing. There is no cord deformity. The left foramen is also mildly narrowed at this level.  Very shallow left paracentral protrusion C6-7 without central canal or foraminal stenosis.   Electronically Signed   By: Inge Rise M.D.   On: 11/01/2014 11:50   Mr Thoracic Spine W Wo Contrast  11/01/2014   CLINICAL DATA:  Bilateral weakness. Multiple falls. Confusion. Initial encounter.  EXAM: MRI THORACIC SPINE WITHOUT AND WITH CONTRAST  TECHNIQUE: Multiplanar and multiecho pulse sequences of the thoracic spine were obtained without and with intravenous contrast.  CONTRAST:  12 mL MULTIHANCE GADOBENATE DIMEGLUMINE 529 MG/ML IV SOLN  COMPARISON:  None.  FINDINGS: Vertebral body height, signal and alignment are maintained. The thoracic cord demonstrates normal size, shape and signal at all levels. The central canal and foramina are widely patent at all levels. No pathologic enhancement after contrast administration is identified. Imaged paraspinous structures are unremarkable.  IMPRESSION: Negative examination.  No finding to explain the patient's symptoms.   Electronically Signed   By: Inge Rise M.D.   On: 11/01/2014 11:54    Medications: I have reviewed the patient's current medications.  Assesment:   Principal Problem:   Ataxia Active Problems:   Bipolar disorder   Tobacco abuse   Alcohol abuse   Essential hypertension, benign   Hypokalemia    Plan:  Medications reviewed Continue current treatment  Physical therapy Neurology consult pending.    LOS: 2 days   Sharece Fleischhacker 11/03/2014, 7:27 AM

## 2014-11-03 NOTE — Consult Note (Signed)
Angel A. [name], MD     [email or web address]          Angel Price is an 49 y.o. female.   ASSESSMENT/PLAN: 1. Subacute gait impairment most likely due to direct effect of alcoholic toxicity. This is likely effecting both centrally involving the cerebellum and the peripheral involving the nerves. No other treatable neurological causes of uncovered. The patient is urged to completely discontinue using/abuse alcohol. Additional last been obtained to look for additional treatable causes including RPR and HIV. Physical therapy is recommended. Thiamine.  The patient is a 49 year old black female who presents with a one-month history of progressive gait impairment. The patient does admit to drinking alcohol daily. She consumes large amounts. The patient has been worked up extensively with her entire neuraxis imaged without evidence of pathology to explain her symptoms. The patient does not report having numbness or tingling. She does not report focal weakness or numbness. She simply states that her legs give out and she has difficulty standing still. She does not report chest pain, shows of breath, dysarthria, dysphagia, or diplopia. She does not report having frequent headaches. She does have headaches from time to time. She does not report having weakness of the upper extremities. Bilateral bowel symptoms are not reported. Review of systems otherwise negative.  GENERAL: Pleasant in no acute distress.  HEENT: Supple. Atraumatic normocephalic.   ABDOMEN: soft  EXTREMITIES: No edema   BACK: Normal.  SKIN: Normal by inspection.    MENTAL STATUS: Alert and oriented. Speech, language and cognition are generally intact. Judgment and insight normal.   CRANIAL NERVES: Pupils are equal, round and reactive to light and accommodation; extra ocular movements are full, there mild gaze evoked nystagmus; visual fields are full; upper and lower facial muscles are normal in strength  and symmetric, there is no flattening of the nasolabial folds; tongue is midline; uvula is midline; shoulder elevation is normal.  MOTOR: Normal tone, bulk and strength- the upper extremities; no pronator drift. Hip flexion 4/5. Dorsiflexion 4 minus/5 bilaterally. Bulk and tone are normal in the lower extremities.  COORDINATION: Left finger to nose is normal, right finger to nose is normal, No rest tremor; no intention tremor; no postural tremor; no bradykinesia.  REFLEXES: Deep tendon reflexes are symmetrical and normal. Babinski reflexes are flexor bilaterally.   SENSATION: Normal to light touch.  The patient's brain MRI is reviewed in person and shows mild to moderate global atrophy. No acute lesions are noted. No white matter lesions are appreciated. There is no microhemorrhages. The scan of the cervical and thoracic spine shows no compressive lesions.   Blood pressure 129/93, pulse 70, temperature 98.2 F (36.8 C), temperature source Oral, resp. rate 20, height '5\' 3"'  (1.6 m), weight 60.328 kg (133 lb), SpO2 98 %.  Past Medical History  Diagnosis Date  . Lupus   . 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 Hypertension   . Abscess     soft tissue  . Suicide attempt   . Blood transfusion without reported diagnosis   . Chronic abdominal pain   . Chronic wound infection of abdomen   . Adrenal mass   . Anxiety   . Depression   . GERD (gastroesophageal reflux disease)   . Sickle cell trait   . Gastroparesis Nov 2015  . Nausea and vomiting     chronic, recurrent  . Alcohol abuse   . History of Billroth II operation   . Lung nodule <  6cm on CT 04/25/2014  . Hemorrhoid     internal large  . Diverticulosis     colonoscopy 04/2014 moderat pan colonic  . Colon polyp     colonoscopy 04/2014  . Gastritis     EGD 05/2014  . Hiatal hernia   . Schatzki's ring     patent per EGD 04/2014  . Lung nodule     CT 02/2014 needs repeat 1 month    Past Surgical History  Procedure  Laterality Date  . Abdominal surgery    . Abdominal hysterectomy  2013    [place]  . Debridement of abdominal wall abscess N/A 02/08/2013    Procedure: DEBRIDEMENT OF ABDOMINAL WALL ABSCESS;  Surgeon: Jamesetta So, MD;  Location: [facility];  Service: General;  Laterality: N/A;  . Billroth ii procedure       [place], first 2000, 2005/2006.  . Colonoscopy      in [place]  . Cholecystectomy    . Colonoscopy with propofol N/A 05/20/2013    Dr.Rourk- inadequate prep, normal appearing rectum, grossly normal colon aside from pancolonic diverticula, normal terminal ileum bx= unremarkable colonic mucosa. Due for early interval 2016.   Marland Kitchen Esophagogastroduodenoscopy (egd) with propofol N/A 05/20/2013    Dr.Rourk- s/p prior gastric surgery with normal esophagus, residual gastric mucosa and patent efferent limb  . Esophageal biopsy  05/20/2013    Procedure: BIOPSIES OF ASCENDING AND SIGMOID COLON;  Surgeon: Daneil Dolin, MD;  Location: [facility];  Service: Endoscopy;;  . Wound exploration Right 06/24/2013    Procedure: exploration of traumatic wound right wrist;  Surgeon: Tennis Must, MD;  Location: [place];  Service: Orthopedics;  Laterality: Right;  . Tendon repar Right     wrist  . Adrenal turmor removal    . Esophagogastroduodenoscopy (egd) with propofol N/A 02/03/2014    Dr. Gala Romney:  s/p hemigastrectomy with retained gastric contents. Residual gastric mucosa and efferent limb appeared normal otherwise. Query gastroparesis.   . Colonoscopy with propofol N/A 04/26/2014    CHE:NIDPOE ileum/one colon polyp removed/moderate pan-colonic diverticulosis/large internal hemorrhoids  . Esophagogastroduodenoscopy (egd) with propofol N/A 04/26/2014    [facility]:NTIRWERX'V ring/small HH/mild non-erosive gasrtitis/normal anastomosis  . Esophageal biopsy  04/26/2014    Procedure: BIOPSIES;  Surgeon: Danie Binder, MD;  Location: [facility];  Service: Endoscopy;;    Family History  Problem Relation Age of Onset  . Brain cancer Son    . Schizophrenia Son   . Cancer Son     brain  . Breast cancer Maternal Aunt   . Bipolar disorder Maternal Aunt   . Drug abuse Maternal Aunt   . Colon cancer Maternal Grandmother     late 50s, early 40s  . Lung cancer Father   . Cancer Father     mets  . Liver disease Neg Hx   . Drug abuse Mother   . Drug abuse Sister   . Drug abuse Brother   . Bipolar disorder Paternal Grandfather   . Bipolar disorder Cousin     Social History:  reports that she has been smoking Cigarettes.  She has a 8.75 pack-year smoking history. She has never used smokeless tobacco. She reports that she drinks about 7.2 oz of alcohol per week. She reports that she does not use illicit drugs.  Allergies:  Allergies  Allergen Reactions  . Codeine Hives  . Morphine And Related Hives    gastritis    Medications: Prior to Admission medications   Medication Sig Start  Date End Date Taking? Authorizing Provider  ARIPiprazole (ABILIFY) 5 MG tablet Take 1 tablet (5 mg total) by mouth 2 (two) times daily. 07/16/13  Yes Cloria Spring, MD  escitalopram (LEXAPRO) 10 MG tablet Take 10 mg by mouth daily.   Yes Historical Provider, MD  fluticasone (FLONASE) 50 MCG/ACT nasal spray Place 1 spray into both nostrils daily.   Yes Historical Provider, MD  hydrocortisone (ANUSOL-HC) 2.5 % rectal cream Place 1 application rectally 2 (two) times daily. 05/17/14  Yes Orvil Feil, NP  ibuprofen (ADVIL,MOTRIN) 200 MG tablet Take 400 mg by mouth every 6 (six) hours as needed.   Yes Historical Provider, MD  lisinopril (PRINIVIL,ZESTRIL) 10 MG tablet Take 10 mg by mouth daily.   Yes Historical Provider, MD  metoCLOPramide (REGLAN) 10 MG tablet Take 1 tablet (10 mg total) by mouth 4 (four) times daily -  before meals and at bedtime. 04/06/14  Yes Lezlie Octave Black, NP  mupirocin ointment (BACTROBAN) 2 % Place into the nose 2 (two) times daily. Patient taking differently: Place 1 application into the nose 2 (two) times daily.  12/13/13  Yes  Maryann Mikhail, DO  ondansetron (ZOFRAN) 8 MG tablet Take 1 tablet (8 mg total) by mouth every 8 (eight) hours as needed for nausea or vomiting. 09/04/14  Yes Daleen Bo, MD  pantoprazole (PROTONIX) 40 MG tablet Take 1 tablet (40 mg total) by mouth 2 (two) times daily before a meal. 09/04/14  Yes Daleen Bo, MD  sucralfate (CARAFATE) 1 G tablet Take 1 g by mouth 4 (four) times daily.   Yes Historical Provider, MD    Scheduled Meds: . ARIPiprazole  5 mg Oral BID  . enoxaparin (LOVENOX) injection  40 mg Subcutaneous Q24H  . escitalopram  10 mg Oral Daily  . fluticasone  1 spray Each Nare Daily  . folic acid  1 mg Oral Daily  . hydrocortisone  1 application Rectal BID  . lisinopril  10 mg Oral Daily  . metoCLOPramide  10 mg Oral TID AC & HS  . multivitamin with minerals  1 tablet Oral Daily  . mupirocin ointment  1 application Nasal BID  . pantoprazole  40 mg Oral BID AC  . sodium chloride  3 mL Intravenous Q12H  . sucralfate  1 g Oral QID  . thiamine  100 mg Oral Daily   Continuous Infusions: . 0.45 % NaCl with KCl 20 mEq / L 75 mL/hr at 11/02/14 2116   PRN Meds:.acetaminophen **OR** acetaminophen, alum & mag hydroxide-simeth, HYDROcodone-acetaminophen, LORazepam **OR** LORazepam, ondansetron **OR** ondansetron (ZOFRAN) IV, polyethylene glycol, sorbitol, traZODone     Results for orders placed or performed during the hospital encounter of 11/01/14 (from the past 48 hour(s))  Urinalysis, Routine w reflex microscopic (not at [facility])     Status: Abnormal   Collection Time: 11/01/14  8:00 AM  Result Value Ref Range   Color, Urine YELLOW YELLOW   APPearance CLEAR CLEAR   Specific Gravity, Urine 1.030 1.005 - 1.030   pH 5.5 5.0 - 8.0   Glucose, UA NEGATIVE NEGATIVE mg/dL   Hgb urine dipstick NEGATIVE NEGATIVE   Bilirubin Urine SMALL (A) NEGATIVE   Ketones, ur NEGATIVE NEGATIVE mg/dL   Protein, ur NEGATIVE NEGATIVE mg/dL   Urobilinogen, UA 0.2 0.0 - 1.0 mg/dL   Nitrite NEGATIVE  NEGATIVE   Leukocytes, UA NEGATIVE NEGATIVE    Comment: MICROSCOPIC NOT DONE ON URINES WITH NEGATIVE PROTEIN, BLOOD, LEUKOCYTES, NITRITE, OR GLUCOSE <1000 mg/dL.  Vitamin  B12     Status: Abnormal   Collection Time: 11/01/14  8:37 AM  Result Value Ref Range   Vitamin B-12 135 (L) 180 - 914 pg/mL    Comment: (NOTE) This assay is not validated for testing neonatal or myeloproliferative syndrome specimens for Vitamin B12 levels. Performed at [facility]   CBC with Differential/Platelet     Status: Abnormal   Collection Time: 11/01/14  8:39 AM  Result Value Ref Range   WBC 4.1 4.0 - 10.5 K/uL   RBC 3.66 (L) 3.87 - 5.11 MIL/uL   Hemoglobin 11.4 (L) 12.0 - 15.0 g/dL   HCT 32.6 (L) 36.0 - 46.0 %   MCV 89.1 78.0 - 100.0 fL   MCH 31.1 26.0 - 34.0 pg   MCHC 35.0 30.0 - 36.0 g/dL   RDW 16.1 (H) 11.5 - 15.5 %   Platelets 195 150 - 400 K/uL   Neutrophils Relative % 54 43 - 77 %   Neutro Abs 2.2 1.7 - 7.7 K/uL   Lymphocytes Relative 31 12 - 46 %   Lymphs Abs 1.3 0.7 - 4.0 K/uL   Monocytes Relative 14 (H) 3 - 12 %   Monocytes Absolute 0.6 0.1 - 1.0 K/uL   Eosinophils Relative 1 0 - 5 %   Eosinophils Absolute 0.0 0.0 - 0.7 K/uL   Basophils Relative 0 0 - 1 %   Basophils Absolute 0.0 0.0 - 0.1 K/uL  Comprehensive metabolic panel     Status: Abnormal   Collection Time: 11/01/14  8:39 AM  Result Value Ref Range   Sodium 141 135 - 145 mmol/L   Potassium 3.1 (L) 3.5 - 5.1 mmol/L   Chloride 104 101 - 111 mmol/L   CO2 25 22 - 32 mmol/L   Glucose, Bld 102 (H) 65 - 99 mg/dL   BUN <5 (L) 6 - 20 mg/dL   Creatinine, Ser 0.39 (L) 0.44 - 1.00 mg/dL   Calcium 8.9 8.9 - 10.3 mg/dL   Total Protein 7.4 6.5 - 8.1 g/dL   Albumin 3.6 3.5 - 5.0 g/dL   AST 54 (H) 15 - 41 U/L   ALT 29 14 - 54 U/L   Alkaline Phosphatase 92 38 - 126 U/L   Total Bilirubin 0.7 0.3 - 1.2 mg/dL   GFR calc non Af Amer >60 >60 mL/min   GFR calc Af Amer >60 >60 mL/min    Comment: (NOTE) The eGFR has been calculated using  the CKD EPI equation. This calculation has not been validated in all clinical situations. eGFR's persistently <60 mL/min signify possible Chronic Kidney Disease.    Anion gap 12 5 - 15  Magnesium     Status: None   Collection Time: 11/01/14  8:39 AM  Result Value Ref Range   Magnesium 1.7 1.7 - 2.4 mg/dL  Urine rapid drug screen (hosp performed)     Status: None   Collection Time: 11/01/14  9:00 AM  Result Value Ref Range   Opiates NONE DETECTED NONE DETECTED   Cocaine NONE DETECTED NONE DETECTED   Benzodiazepines NONE DETECTED NONE DETECTED   Amphetamines NONE DETECTED NONE DETECTED   Tetrahydrocannabinol NONE DETECTED NONE DETECTED   Barbiturates NONE DETECTED NONE DETECTED    Comment:        DRUG SCREEN FOR MEDICAL PURPOSES ONLY.  IF CONFIRMATION IS NEEDED FOR ANY PURPOSE, NOTIFY LAB WITHIN 5 DAYS.        LOWEST DETECTABLE LIMITS FOR URINE DRUG SCREEN Drug Class  Cutoff (ng/mL) Amphetamine      1000 Barbiturate      200 Benzodiazepine   742 Tricyclics       595 Opiates          300 Cocaine          300 THC              50   TSH     Status: None   Collection Time: 11/01/14  3:36 PM  Result Value Ref Range   TSH 0.570 0.350 - 4.500 uIU/mL  MRSA PCR Screening     Status: None   Collection Time: 11/01/14  9:20 PM  Result Value Ref Range   MRSA by PCR NEGATIVE NEGATIVE    Comment:        The GeneXpert MRSA Assay (FDA approved for NASAL specimens only), is one component of a comprehensive MRSA colonization surveillance program. It is not intended to diagnose MRSA infection nor to guide or monitor treatment for MRSA infections.   Comprehensive metabolic panel     Status: Abnormal   Collection Time: 11/02/14  6:15 AM  Result Value Ref Range   Sodium 138 135 - 145 mmol/L   Potassium 4.0 3.5 - 5.1 mmol/L    Comment: DELTA CHECK NOTED   Chloride 107 101 - 111 mmol/L   CO2 27 22 - 32 mmol/L   Glucose, Bld 103 (H) 65 - 99 mg/dL   BUN <5 (L) 6 - 20 mg/dL    Creatinine, Ser 0.44 0.44 - 1.00 mg/dL   Calcium 8.5 (L) 8.9 - 10.3 mg/dL   Total Protein 5.8 (L) 6.5 - 8.1 g/dL   Albumin 2.8 (L) 3.5 - 5.0 g/dL   AST 57 (H) 15 - 41 U/L   ALT 25 14 - 54 U/L   Alkaline Phosphatase 71 38 - 126 U/L   Total Bilirubin 0.5 0.3 - 1.2 mg/dL   GFR calc non Af Amer >60 >60 mL/min   GFR calc Af Amer >60 >60 mL/min    Comment: (NOTE) The eGFR has been calculated using the CKD EPI equation. This calculation has not been validated in all clinical situations. eGFR's persistently <60 mL/min signify possible Chronic Kidney Disease.    Anion gap 4 (L) 5 - 15  CBC     Status: Abnormal   Collection Time: 11/02/14  6:15 AM  Result Value Ref Range   WBC 3.4 (L) 4.0 - 10.5 K/uL   RBC 3.06 (L) 3.87 - 5.11 MIL/uL   Hemoglobin 9.4 (L) 12.0 - 15.0 g/dL   HCT 27.8 (L) 36.0 - 46.0 %   MCV 90.8 78.0 - 100.0 fL   MCH 30.7 26.0 - 34.0 pg   MCHC 33.8 30.0 - 36.0 g/dL   RDW 16.2 (H) 11.5 - 15.5 %   Platelets 181 150 - 400 K/uL    Studies/Results:  The patient's brain MRI is reviewed in person. The scan is done without and with IV contrast. There are no abnormal enhancement. No acute lesions are seen on diffusion imaging. No microhemorrhages are appreciated. No white matter lesions are appreciated. Cervical and thoracic spine MRI results reviewed and shows no compressive lesions worrisome for myelopathy.   Angel Price A. Merlene Price, M.D.  Diplomate, Tax adviser of Psychiatry and Neurology ( Neurology). 11/03/2014, 7:32 AM

## 2014-11-03 NOTE — Progress Notes (Signed)
Physical Therapy Treatment Patient Details Name: Angel Price MRN: [id number] DOB: 1965/04/07 Today's Date: 11/03/2014    History of Present Illness Angel Price is a 49yo black F c PMH: EtOH abuse, frequent gastro-paresis, anemia, schizoaffective disorder, hypertension, presents to the emergency Department chief complaint of inability to walk after fall 3 days ago. Pt reports collapsing onto knees about three days ago s strength to come back to feet. Pt reports gradual weakness and chronic achy pain in legs began a month ago. Pt reports she has been off HTN meds for ~2 months as she never was able to refill the script. Initial evaluation in the emergency department concerning for spinal cord abnormalities and/or acute brain process. She obtained an MRI of C-spine, thoracic spine and brain all of which were within the limits of normal. In spite of this she had difficulty walking and therefore was referred for admission. At admission, associated symptoms include bilateral lower extremity pain described as sharp constant with weightbearing located from hips to toes. Denies transience of Sx c positional changes. She denies headache, visual disturbances, numbness or tingling of extremities.     PT Comments    Pt was found to be alert and oriented, feels a bit stronger today.  She was able to tolerate bilateral LE strengthening exercise with pain only during assisted hip/knee flexion.  She was able to transfer OOB without assist and stand to a walker.  She was able to ambulate 30' with a walker, stable gait pattern.  I do feel that she will be able to transition to home at d/c as long as she has max assist at home.  Follow Up Recommendations  Home health PT     Equipment Recommendations  Rolling walker with 5" wheels    Recommendations for Other Services  none     Precautions / Restrictions Precautions Precautions: Fall Restrictions Weight Bearing Restrictions: No    Mobility  Bed  Mobility Overal bed mobility: Modified Independent                Transfers Overall transfer level: Needs assistance Equipment used: Rolling walker (2 wheeled) Transfers: Sit to/from Stand Sit to Stand: Supervision            Ambulation/Gait Ambulation/Gait assistance: Min guard Ambulation Distance (Feet): 30 Feet   Gait Pattern/deviations: Shuffle   Gait velocity interpretation: <1.8 ft/sec, indicative of risk for recurrent falls     Stairs            Wheelchair Mobility    Modified Rankin (Stroke Patients Only)       Balance Overall balance assessment: Needs assistance Sitting-balance support: No upper extremity supported;Feet supported Sitting balance-Leahy Scale: Good     Standing balance support: Bilateral upper extremity supported Standing balance-Leahy Scale: Fair                      Cognition Arousal/Alertness: Awake/alert Behavior During Therapy: WFL for tasks assessed/performed Overall Cognitive Status: Within Functional Limits for tasks assessed                      Exercises General Exercises - Lower Extremity Ankle Circles/Pumps: AROM;Both;10 reps;Supine Quad Sets: AROM;Both;10 reps;Supine Gluteal Sets: AROM;Both;10 reps;Supine Short Arc Quad: AROM;Both;AAROM;10 reps;Supine Heel Slides: AAROM;Both;5 reps;Supine Hip ABduction/ADduction: AAROM;Both;10 reps;Supine    General Comments        Pertinent Vitals/Pain Pain Assessment: No/denies pain    Home Living  Prior Function            PT Goals (current goals can now be found in the care plan section) Progress towards PT goals: Progressing toward goals    Frequency  Min 3X/week    PT Plan Discharge plan needs to be updated    Co-evaluation             End of Session Equipment Utilized During Treatment: Gait belt Activity Tolerance: Patient tolerated treatment well Patient left: in bed;with call bell/phone within  reach     Time: 6415-8309 PT Time Calculation (min) (ACUTE ONLY): 27 min  Charges:  $Gait Training: 8-22 mins $Therapeutic Exercise: 8-22 mins                    G CodesSable Feil  PT 11/03/2014, 3:59 PM [phone]

## 2014-11-04 LAB — HIV ANTIBODY (ROUTINE TESTING W REFLEX): HIV SCREEN 4TH GENERATION: NONREACTIVE

## 2014-11-04 LAB — RPR: RPR: NONREACTIVE

## 2014-11-04 MED ORDER — CYANOCOBALAMIN 1000 MCG/ML IJ SOLN
1000.0000 ug | Freq: Once | INTRAMUSCULAR | Status: AC
  Administered 2014-11-04: 1000 ug via INTRAMUSCULAR
  Filled 2014-11-04: qty 1

## 2014-11-04 NOTE — Progress Notes (Signed)
Pt IV and telemetry removed, pt tolerated well.  Reviewed discharge instructions with pt and answered questions.  Walker delivered from [facility].  Pt transported to main lobby for discharge home.

## 2014-11-04 NOTE — Care Management Note (Signed)
Case Management Note  Patient Details  Name: Angel Price MRN: [id number] Date of Birth: September 26, 1965  Subjective/Objective:                    Action/Plan:   Expected Discharge Date:                  Expected Discharge Plan:  Welcome  In-House Referral:  Clinical Social Work  Discharge planning Services  CM Consult  Post Acute Care Choice:  Home Health Choice offered to:  Patient  DME Arranged:  Walker rolling DME Agency:  [place]:  PT [facility] Agency:  [place]  Status of Service:  Completed, signed off  Medicare Important Message Given:    Date Medicare IM Given:    Medicare IM give by:    Date Additional Medicare IM Given:    Additional Medicare Important Message give by:     If discussed at [place] of Stay Meetings, dates discussed:    Additional Comments: Pt discharged home today with [facility] PT (per pts choice). Romualdo Bolk of [facility] is aware and will collect the pts information from the chart. [place] services to start within 48 hours of discharge. Pt also to receive rolling walker from [facility] and will be delivered to pts room prior to discharge. Pt and pts nurse aware of discharge arrangements. Christinia Gully [place], RN 11/04/2014, 10:07 AM

## 2014-11-04 NOTE — Care Management Important Message (Signed)
Important Message  Patient Details  Name: Angel Price MRN: [id number] Date of Birth: 1965/09/25   Medicare Important Message Given:  Yes-second notification given    Joylene Draft, RN 11/04/2014, 10:10 AM

## 2014-11-04 NOTE — Discharge Summary (Signed)
Physician Discharge Summary  Patient ID: Angel Price MRN: [id number] DOB/AGE: March 20, 1965 49 y.o. Primary Care Physician:FANTA,TESFAYE, MD Admit date: 11/16/2014 Discharge date: 11/04/2014    Discharge Diagnoses:  1. Alcohol intoxication, with gait abnormalities. Alcohol abuse. 2. B-12 deficiency. 3. Hypertension. 4. Bipolar disorder. 5. Tobacco abuse.     Medication List    TAKE these medications        ARIPiprazole 5 MG tablet  Commonly known as:  ABILIFY  Take 1 tablet (5 mg total) by mouth 2 (two) times daily.     escitalopram 10 MG tablet  Commonly known as:  LEXAPRO  Take 10 mg by mouth daily.     fluticasone 50 MCG/ACT nasal spray  Commonly known as:  FLONASE  Place 1 spray into both nostrils daily.     hydrocortisone 2.5 % rectal cream  Commonly known as:  ANUSOL-HC  Place 1 application rectally 2 (two) times daily.     ibuprofen 200 MG tablet  Commonly known as:  ADVIL,MOTRIN  Take 400 mg by mouth every 6 (six) hours as needed.     lisinopril 10 MG tablet  Commonly known as:  PRINIVIL,ZESTRIL  Take 10 mg by mouth daily.     metoCLOPramide 10 MG tablet  Commonly known as:  REGLAN  Take 1 tablet (10 mg total) by mouth 4 (four) times daily -  before meals and at bedtime.     mupirocin ointment 2 %  Commonly known as:  BACTROBAN  Place into the nose 2 (two) times daily.     ondansetron 8 MG tablet  Commonly known as:  ZOFRAN  Take 1 tablet (8 mg total) by mouth every 8 (eight) hours as needed for nausea or vomiting.     pantoprazole 40 MG tablet  Commonly known as:  PROTONIX  Take 1 tablet (40 mg total) by mouth 2 (two) times daily before a meal.     sucralfate 1 G tablet  Commonly known as:  CARAFATE  Take 1 g by mouth 4 (four) times daily.        Discharged Condition: Stable.    Consults: Neurology.  Significant Diagnostic Studies: Mr Angel Price Contrast  2014-11-16   CLINICAL DATA:  Weakness and confusion with multiple falls.  Symptom duration approximately 1 month. History of urinary incontinence.  EXAM: MRI HEAD WITHOUT AND WITH CONTRAST  TECHNIQUE: Multiplanar, multiecho pulse sequences of the brain and surrounding structures were obtained without and with intravenous contrast.  CONTRAST:  10mL MULTIHANCE GADOBENATE DIMEGLUMINE 529 MG/ML IV SOLN  COMPARISON:  MRI cervical and thoracic spine reported separately.  FINDINGS: The patient was unable to remain motionless for the exam. Small or subtle lesions could be overlooked.  No evidence for acute infarction, hemorrhage, mass lesion, hydrocephalus, or extra-axial fluid. Mild atrophy. No appreciable white matter disease.  Partial empty sella. Pineal and cerebellar tonsils unremarkable. No upper cervical lesions.  Flow voids are maintained throughout the carotid, basilar, and vertebral arteries. There are no areas of chronic hemorrhage.  Post infusion, no abnormal enhancement of the brain or meninges.  Visualized calvarium, skull base, and upper cervical osseous structures unremarkable. Scalp and extracranial soft tissues, orbits, sinuses, and mastoids show no acute process.  IMPRESSION: Motion degraded examination demonstrating no acute intracranial findings. Mild atrophy is suggested which may be premature for the patient's age.  Partial empty sella, nonspecific.   Electronically Signed   By: Staci Righter M.D.   On: 11-16-14 11:51   Mr Cervical Spine  W Wo Contrast  11/01/2014   CLINICAL DATA:  Bilateral weakness. Multiple falls. Confusion. Initial encounter.  EXAM: MRI CERVICAL SPINE WITHOUT AND WITH CONTRAST  TECHNIQUE: Multiplanar and multiecho pulse sequences of the cervical spine, to include the craniocervical junction and cervicothoracic junction, were obtained according to standard protocol without and with intravenous contrast.  CONTRAST:  12 mL MULTIHANCE GADOBENATE DIMEGLUMINE 529 MG/ML IV SOLN  COMPARISON:  None.  FINDINGS: There is mild kyphosis about the C5-6 level due  to loss of disc space height anteriorly. Vertebral body height is maintained. Mild degenerative endplate signal change is seen at C5-6. There is no worrisome marrow lesion. The craniocervical junction is normal and cervical cord signal is normal. Imaged paraspinous structures are unremarkable.  C2-3: Shallow disc bulge to the right without central canal or foraminal stenosis.  C3-4: Very shallow central protrusion without central canal or foraminal stenosis.  C4-5: Minimal disc bulge without central canal or foraminal stenosis.  C5-6: A disc osteophyte complex effaces the ventral thecal sac but the central canal appears open. Mild left foraminal narrowing is noted. The right foramen is patent.  C6-7: Very shallow left paracentral protrusion without central canal or foraminal stenosis.  C7-T1:  Negative.  IMPRESSION: No finding to explain the patient's symptoms.  Shallow disc bulge at C5-6 effaces the ventral thecal sac causing mild central canal narrowing. There is no cord deformity. The left foramen is also mildly narrowed at this level.  Very shallow left paracentral protrusion C6-7 without central canal or foraminal stenosis.   Electronically Signed   By: Inge Rise M.D.   On: 11/01/2014 11:50   Mr Thoracic Spine W Wo Contrast  11/01/2014   CLINICAL DATA:  Bilateral weakness. Multiple falls. Confusion. Initial encounter.  EXAM: MRI THORACIC SPINE WITHOUT AND WITH CONTRAST  TECHNIQUE: Multiplanar and multiecho pulse sequences of the thoracic spine were obtained without and with intravenous contrast.  CONTRAST:  12 mL MULTIHANCE GADOBENATE DIMEGLUMINE 529 MG/ML IV SOLN  COMPARISON:  None.  FINDINGS: Vertebral body height, signal and alignment are maintained. The thoracic cord demonstrates normal size, shape and signal at all levels. The central canal and foramina are widely patent at all levels. No pathologic enhancement after contrast administration is identified. Imaged paraspinous structures are  unremarkable.  IMPRESSION: Negative examination.  No finding to explain the patient's symptoms.   Electronically Signed   By: Inge Rise M.D.   On: 11/01/2014 11:54    Lab Results: Basic Metabolic Panel:  Recent Labs  11/02/14 0615  NA 138  K 4.0  CL 107  CO2 27  GLUCOSE 103*  BUN <5*  CREATININE 0.44  CALCIUM 8.5*   Liver Function Tests:  Recent Labs  11/02/14 0615  AST 57*  ALT 25  ALKPHOS 71  BILITOT 0.5  PROT 5.8*  ALBUMIN 2.8*     CBC:  Recent Labs  11/02/14 0615  WBC 3.4*  HGB 9.4*  HCT 27.8*  MCV 90.8  PLT 181    Recent Results (from the past 240 hour(s))  MRSA PCR Screening     Status: None   Collection Time: 11/01/14  9:20 PM  Result Value Ref Range Status   MRSA by PCR NEGATIVE NEGATIVE Final    Comment:        The GeneXpert MRSA Assay (FDA approved for NASAL specimens only), is one component of a comprehensive MRSA colonization surveillance program. It is not intended to diagnose MRSA infection nor to guide or monitor treatment for MRSA  infections.      Hospital Course: This is a 49 year old lady who was admitted with inability to walk after fall 3 days prior to admission. Please see initial history as outlined below: HPI: Angel Price is a 49 y.o. female with a past medical history that includes EtOH abuse, frequent gastro-paresis, anemia, schizoaffective disorder, hypertension, presents to the emergency Department chief complaint of inability to walk after fall 3 days ago. Initial evaluation in the emergency department concerning for spinal cord abnormalities and/or acute brain process. She obtained an MRI of C-spine, thoracic spine and brain all of which were within the limits of normal. In spite of this she had difficulty walking and therefore was referred for admission. Patient reports that 4 days ago she fell down because her "legs gave out". She basically has been crawling ever since. Associated symptoms include bilateral  lower extremity pain described as sharp constant with weightbearing located from hips to toes. Nothing makes better and ambulating next worse. She denies headache visual disturbances numbness or tingling of extremities. She denies being intoxicated during the fall but described her state as "high". She reports that while in this states she is able to communicate function is independent with ADLs and able to walk. She reports drinking 5-6 beers a day. She denies abdominal pain nausea vomiting diarrhea constipation. She denies numbness or tingling of her extremities. She denies dysuria hematuria frequency or urgency. Workup in the emergency department reveals mild hypokalemia with a potassium of 3.1. MRI as noted above. Urinalysis unremarkable In the emergency department she is afebrile hemodynamically stable and not hypoxic. She is given IV fluids and Zofran. As well as thiamine. She underwent extensive investigation in the hospital including MRI brain, spinal cord, thoracic, cervical. She was seen by neurology also who felt that her symptoms are related to alcohol abuse/intoxication. B-12 levels were reduced and she was given 1 mg B-12 injection prior to discharge. Discharge Exam: Blood pressure 106/66, pulse 64, temperature 98.5 F (36.9 C), temperature source Oral, resp. rate 18, height 5\' 3"  (1.6 m), weight 60.328 kg (133 lb), SpO2 100 %. She looks systemically well. She appears to be alert and orientated. There are no focal neurological signs.  Disposition: Home. She will follow with her primary care physician in the next 1-2 weeks.      Discharge Instructions    Diet - low sodium heart healthy    Complete by:  As directed      Increase activity slowly    Complete by:  As directed              Signed: [place] C   11/04/2014, 9:03 AM

## 2014-11-11 ENCOUNTER — Emergency Department (HOSPITAL_COMMUNITY)
Admission: EM | Admit: 2014-11-11 | Discharge: 2014-11-11 | Disposition: A | Discharge status: Home / Self Care | Payer: Medicare Other | Attending: Emergency Medicine | Admitting: Emergency Medicine

## 2014-11-11 ENCOUNTER — Emergency Department (HOSPITAL_COMMUNITY): Payer: Medicare Other

## 2014-11-11 ENCOUNTER — Encounter (HOSPITAL_COMMUNITY): Payer: Self-pay | Admitting: *Deleted

## 2014-11-11 DIAGNOSIS — G8929 Other chronic pain: Secondary | ICD-10-CM | POA: Diagnosis not present

## 2014-11-11 DIAGNOSIS — Z720 Tobacco use: Secondary | ICD-10-CM | POA: Insufficient documentation

## 2014-11-11 DIAGNOSIS — Z872 Personal history of diseases of the skin and subcutaneous tissue: Secondary | ICD-10-CM | POA: Insufficient documentation

## 2014-11-11 DIAGNOSIS — Z8659 Personal history of other mental and behavioral disorders: Secondary | ICD-10-CM | POA: Diagnosis not present

## 2014-11-11 DIAGNOSIS — Z7951 Long term (current) use of inhaled steroids: Secondary | ICD-10-CM | POA: Diagnosis not present

## 2014-11-11 DIAGNOSIS — M329 Systemic lupus erythematosus, unspecified: Secondary | ICD-10-CM | POA: Diagnosis not present

## 2014-11-11 DIAGNOSIS — K219 Gastro-esophageal reflux disease without esophagitis: Secondary | ICD-10-CM | POA: Diagnosis not present

## 2014-11-11 DIAGNOSIS — Z87738 Personal history of other specified (corrected) congenital malformations of digestive system: Secondary | ICD-10-CM | POA: Insufficient documentation

## 2014-11-11 DIAGNOSIS — Z79899 Other long term (current) drug therapy: Secondary | ICD-10-CM | POA: Diagnosis not present

## 2014-11-11 DIAGNOSIS — Z86010 Personal history of colonic polyps: Secondary | ICD-10-CM | POA: Insufficient documentation

## 2014-11-11 DIAGNOSIS — Z9889 Other specified postprocedural states: Secondary | ICD-10-CM | POA: Insufficient documentation

## 2014-11-11 DIAGNOSIS — R51 Headache: Secondary | ICD-10-CM | POA: Diagnosis not present

## 2014-11-11 DIAGNOSIS — I10 Essential (primary) hypertension: Secondary | ICD-10-CM | POA: Insufficient documentation

## 2014-11-11 DIAGNOSIS — R220 Localized swelling, mass and lump, head: Secondary | ICD-10-CM | POA: Diagnosis not present

## 2014-11-11 DIAGNOSIS — K029 Dental caries, unspecified: Secondary | ICD-10-CM | POA: Diagnosis not present

## 2014-11-11 DIAGNOSIS — R200 Anesthesia of skin: Secondary | ICD-10-CM | POA: Diagnosis not present

## 2014-11-11 DIAGNOSIS — R918 Other nonspecific abnormal finding of lung field: Secondary | ICD-10-CM | POA: Diagnosis not present

## 2014-11-11 DIAGNOSIS — R112 Nausea with vomiting, unspecified: Secondary | ICD-10-CM | POA: Diagnosis not present

## 2014-11-11 DIAGNOSIS — Z8639 Personal history of other endocrine, nutritional and metabolic disease: Secondary | ICD-10-CM | POA: Insufficient documentation

## 2014-11-11 LAB — CBC WITH DIFFERENTIAL/PLATELET
BASOS ABS: 0 10*3/uL (ref 0.0–0.1)
Basophils Relative: 0 % (ref 0–1)
EOS PCT: 1 % (ref 0–5)
Eosinophils Absolute: 0 10*3/uL (ref 0.0–0.7)
HEMATOCRIT: 30.7 % — AB (ref 36.0–46.0)
Hemoglobin: 10.7 g/dL — ABNORMAL LOW (ref 12.0–15.0)
LYMPHS ABS: 1.7 10*3/uL (ref 0.7–4.0)
LYMPHS PCT: 36 % (ref 12–46)
MCH: 30.7 pg (ref 26.0–34.0)
MCHC: 34.9 g/dL (ref 30.0–36.0)
MCV: 88.2 fL (ref 78.0–100.0)
MONO ABS: 0.5 10*3/uL (ref 0.1–1.0)
Monocytes Relative: 11 % (ref 3–12)
NEUTROS ABS: 2.4 10*3/uL (ref 1.7–7.7)
Neutrophils Relative %: 52 % (ref 43–77)
PLATELETS: 406 10*3/uL — AB (ref 150–400)
RBC: 3.48 MIL/uL — AB (ref 3.87–5.11)
RDW: 16 % — AB (ref 11.5–15.5)
WBC: 4.5 10*3/uL (ref 4.0–10.5)

## 2014-11-11 LAB — BASIC METABOLIC PANEL
ANION GAP: 6 (ref 5–15)
BUN: 9 mg/dL (ref 6–20)
CHLORIDE: 107 mmol/L (ref 101–111)
CO2: 26 mmol/L (ref 22–32)
Calcium: 8.8 mg/dL — ABNORMAL LOW (ref 8.9–10.3)
Creatinine, Ser: 0.58 mg/dL (ref 0.44–1.00)
GFR calc Af Amer: >60 mL/min (ref >60)
GLUCOSE: 100 mg/dL — AB (ref 65–99)
POTASSIUM: 4.2 mmol/L (ref 3.5–5.1)
Sodium: 139 mmol/L (ref 135–145)

## 2014-11-11 MED ORDER — HYDROMORPHONE HCL 4 MG PO TABS: 4.0000 mg | ORAL_TABLET | Freq: Four times a day (QID) | ORAL | Status: DC | PRN

## 2014-11-11 MED ORDER — FENTANYL CITRATE (PF) 100 MCG/2ML IJ SOLN
50.0000 ug | Freq: Once | INTRAMUSCULAR | Status: AC
  Administered 2014-11-11: 50 ug via INTRAVENOUS
  Filled 2014-11-11: qty 2

## 2014-11-11 MED ORDER — ONDANSETRON HCL 4 MG/2ML IJ SOLN
4.0000 mg | Freq: Once | INTRAMUSCULAR | Status: AC
  Administered 2014-11-11: 4 mg via INTRAVENOUS
  Filled 2014-11-11: qty 2

## 2014-11-11 MED ORDER — SODIUM CHLORIDE 0.9 % IV SOLN
INTRAVENOUS | Status: DC
  Administered 2014-11-11: 18:00:00 via INTRAVENOUS

## 2014-11-11 NOTE — Discharge Instructions (Signed)
Workup for the leg swelling facial swelling and other symptoms are most likely related to the lupus. Take pain medicine as directed. In addition your platelet counts were high follow up with your regular doctor to have them rechecked. Return for any new or worse symptoms. Take the hydromorphone as needed for the joint discomfort.

## 2014-11-11 NOTE — ED Notes (Signed)
Pt with multiple complaints- per [facility] nurse facial swelling increasing, pt states tongue feels tingling, left elbow pain, bilateral leg swelling, HA for x 2 days, nausea/vomiting x 2 days

## 2014-11-11 NOTE — ED Provider Notes (Signed)
CSN: [id number]     Arrival date & time 11/11/14  1603 History   First MD Initiated Contact with Patient 11/11/14 1642     Chief Complaint  Patient presents with  . Facial Swelling     (Consider location/radiation/quality/duration/timing/severity/associated sxs/prior Treatment) The history is provided by the patient.   49 year old female followed by Dr. Legrand Rams chest was admitted on August 16 and discharged home on August 19 presents here with multiple complaints bilateral facial swelling tongue feels a numb left elbow pain [place] bilateral leg swelling and pain headache for 2 days nausea and vomiting for 2 days  Past Medical History  Diagnosis Date  . Lupus   . 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 Hypertension   . Abscess     soft tissue  . Suicide attempt   . Blood transfusion without reported diagnosis   . Chronic abdominal pain   . Chronic wound infection of abdomen   . Adrenal mass   . Anxiety   . Depression   . GERD (gastroesophageal reflux disease)   . Sickle cell trait   . Gastroparesis Nov 2015  . Nausea and vomiting     chronic, recurrent  . Alcohol abuse   . History of Billroth II operation   . Lung nodule < 6cm on CT 04/25/2014  . Hemorrhoid     internal large  . Diverticulosis     colonoscopy 04/2014 moderat pan colonic  . Colon polyp     colonoscopy 04/2014  . Gastritis     EGD 05/2014  . Hiatal hernia   . Schatzki's ring     patent per EGD 04/2014  . Lung nodule     CT 02/2014 needs repeat 1 month   Past Surgical History  Procedure Laterality Date  . Abdominal surgery    . Abdominal hysterectomy  2013    [place]  . Debridement of abdominal wall abscess N/A 02/08/2013    Procedure: DEBRIDEMENT OF ABDOMINAL WALL ABSCESS;  Surgeon: Jamesetta So, MD;  Location: [facility];  Service: General;  Laterality: N/A;  . Billroth ii procedure       [place], first 2000, 2005/2006.  . Colonoscopy      in [place]  . Cholecystectomy    . Colonoscopy  with propofol N/A 05/20/2013    Dr.Rourk- inadequate prep, normal appearing rectum, grossly normal colon aside from pancolonic diverticula, normal terminal ileum bx= unremarkable colonic mucosa. Due for early interval 2016.   Marland Kitchen Esophagogastroduodenoscopy (egd) with propofol N/A 05/20/2013    Dr.Rourk- s/p prior gastric surgery with normal esophagus, residual gastric mucosa and patent efferent limb  . Esophageal biopsy  05/20/2013    Procedure: BIOPSIES OF ASCENDING AND SIGMOID COLON;  Surgeon: Daneil Dolin, MD;  Location: [facility];  Service: Endoscopy;;  . Wound exploration Right 06/24/2013    Procedure: exploration of traumatic wound right wrist;  Surgeon: Tennis Must, MD;  [place] [facility] [facility];  Service: Orthopedics;  Laterality: Right;  . Tendon repar Right     wrist  . Adrenal turmor removal    . Esophagogastroduodenoscopy (egd) with propofol N/A 02/03/2014    Dr. Gala Romney:  s/p hemigastrectomy with retained gastric contents. Residual gastric mucosa and efferent limb appeared normal otherwise. Query gastroparesis.   . Colonoscopy with propofol N/A 04/26/2014    TKW:IOXBDZ ileum/one colon polyp removed/moderate pan-colonic diverticulosis/large internal hemorrhoids  . Esophagogastroduodenoscopy (egd) with propofol N/A 04/26/2014    HGD:JMEQASTM'H ring/small HH/mild non-erosive gasrtitis/normal anastomosis  .  Esophageal biopsy  04/26/2014    Procedure: BIOPSIES;  Surgeon: Danie Binder, MD;  Location: [facility];  Service: Endoscopy;;   Family History  Problem Relation Age of Onset  . Brain cancer Son   . Schizophrenia Son   . Cancer Son     brain  . Breast cancer Maternal Aunt   . Bipolar disorder Maternal Aunt   . Drug abuse Maternal Aunt   . Colon cancer Maternal Grandmother     late 58s, early 97s  . Lung cancer Father   . Cancer Father     mets  . Liver disease Neg Hx   . Drug abuse Mother   . Drug abuse Sister   . Drug abuse Brother   . Bipolar disorder Paternal Grandfather   . Bipolar  disorder Cousin    Social History  Substance Use Topics  . Smoking status: Current Every Day Smoker -- 0.25 packs/day for 35 years    Types: Cigarettes  . Smokeless tobacco: Never Used  . Alcohol Use: 7.2 oz/week    12 Cans of beer, 0 Standard drinks or equivalent per week     Comment: ETOH abuse. as of 04/25/14, states drinking 1-2 beers per day   OB History    No data available     Review of Systems  Constitutional: Negative for fever.  HENT: Positive for facial swelling. Negative for congestion and trouble swallowing.   Eyes: Negative for visual disturbance.  Respiratory: Negative for shortness of breath.   Cardiovascular: Positive for leg swelling. Negative for chest pain.  Gastrointestinal: Positive for nausea and vomiting. Negative for abdominal pain.  Genitourinary: Negative for dysuria.  Musculoskeletal: Negative for back pain and neck pain.  Neurological: Positive for numbness and headaches.  Hematological: Does not bruise/bleed easily.  Psychiatric/Behavioral: Negative for confusion.      Allergies  Codeine and Morphine and related  Home Medications   Prior to Admission medications   Medication Sig Start Date End Date Taking? Authorizing Provider  fluticasone (FLONASE) 50 MCG/ACT nasal spray Place 1 spray into both nostrils daily.   Yes Historical Provider, MD  ibuprofen (ADVIL,MOTRIN) 200 MG tablet Take 400 mg by mouth every 6 (six) hours as needed for mild pain or moderate pain.    Yes Historical Provider, MD  lisinopril (PRINIVIL,ZESTRIL) 10 MG tablet Take 10 mg by mouth daily.   Yes Historical Provider, MD  metoCLOPramide (REGLAN) 10 MG tablet Take 1 tablet (10 mg total) by mouth 4 (four) times daily -  before meals and at bedtime. 04/06/14  Yes Lezlie Octave Black, NP  mupirocin ointment (BACTROBAN) 2 % Place into the nose 2 (two) times daily. Patient taking differently: Place 1 application into the nose 2 (two) times daily.  12/13/13  Yes Maryann Mikhail, DO   ondansetron (ZOFRAN) 8 MG tablet Take 1 tablet (8 mg total) by mouth every 8 (eight) hours as needed for nausea or vomiting. 09/04/14  Yes Daleen Bo, MD  pantoprazole (PROTONIX) 40 MG tablet Take 1 tablet (40 mg total) by mouth 2 (two) times daily before a meal. Patient taking differently: Take 40 mg by mouth daily.  09/04/14  Yes Daleen Bo, MD  sucralfate (CARAFATE) 1 G tablet Take 1 g by mouth 4 (four) times daily.   Yes Historical Provider, MD  HYDROmorphone (DILAUDID) 4 MG tablet Take 1 tablet (4 mg total) by mouth every 6 (six) hours as needed for severe pain. 11/11/14   Fredia Sorrow, MD   BP 120/84  mmHg  Pulse 78  Temp(Src) 98.7 F (37.1 C) (Oral)  Resp 16  Ht 5\' 3"  (1.6 m)  Wt 136 lb (61.689 kg)  BMI 24.10 kg/m2  SpO2 95% Physical Exam  Constitutional: She is oriented to person, place, and time. She appears well-developed and well-nourished. No distress.  HENT:  Head: Normocephalic and atraumatic.  Mouth/Throat: Oropharynx is clear and moist.  For mild facial swelling no real tenderness no gum swelling no adenopathy  Eyes: Conjunctivae and EOM are normal. Pupils are equal, round, and reactive to light.  Neck: Normal range of motion. Neck supple.  Cardiovascular: Normal rate, regular rhythm and normal heart sounds.   Pulmonary/Chest: Effort normal and breath sounds normal. No respiratory distress.  Abdominal: Bowel sounds are normal. There is no tenderness.  Musculoskeletal: Normal range of motion. She exhibits edema. She exhibits no tenderness.  Bilateral leg swelling no pitting edema and no erythema good cap refill to toes. No knee swelling. No swelling to left elbow.  Neurological: She is alert and oriented to person, place, and time. No cranial nerve deficit. She exhibits normal muscle tone. Coordination normal.  Skin: Skin is warm. No erythema.  Nursing note and vitals reviewed.   ED Course  Procedures (including critical care time) Labs Review Labs Reviewed   BASIC METABOLIC PANEL - Abnormal; Notable for the following:    Glucose, Bld 100 (*)    Calcium 8.8 (*)    All other components within normal limits  CBC WITH DIFFERENTIAL/PLATELET - Abnormal; Notable for the following:    RBC 3.48 (*)    Hemoglobin 10.7 (*)    HCT 30.7 (*)    RDW 16.0 (*)    Platelets 406 (*)    All other components within normal limits   Results for orders placed or performed during the hospital encounter of 85/88/50  Basic metabolic panel  Result Value Ref Range   Sodium 139 135 - 145 mmol/L   Potassium 4.2 3.5 - 5.1 mmol/L   Chloride 107 101 - 111 mmol/L   CO2 26 22 - 32 mmol/L   Glucose, Bld 100 (H) 65 - 99 mg/dL   BUN 9 6 - 20 mg/dL   Creatinine, Ser 0.58 0.44 - 1.00 mg/dL   Calcium 8.8 (L) 8.9 - 10.3 mg/dL   GFR calc non Af Amer >60 >60 mL/min   GFR calc Af Amer >60 >60 mL/min   Anion gap 6 5 - 15  CBC with Differential/Platelet  Result Value Ref Range   WBC 4.5 4.0 - 10.5 K/uL   RBC 3.48 (L) 3.87 - 5.11 MIL/uL   Hemoglobin 10.7 (L) 12.0 - 15.0 g/dL   HCT 30.7 (L) 36.0 - 46.0 %   MCV 88.2 78.0 - 100.0 fL   MCH 30.7 26.0 - 34.0 pg   MCHC 34.9 30.0 - 36.0 g/dL   RDW 16.0 (H) 11.5 - 15.5 %   Platelets 406 (H) 150 - 400 K/uL   Neutrophils Relative % 52 43 - 77 %   Neutro Abs 2.4 1.7 - 7.7 K/uL   Lymphocytes Relative 36 12 - 46 %   Lymphs Abs 1.7 0.7 - 4.0 K/uL   Monocytes Relative 11 3 - 12 %   Monocytes Absolute 0.5 0.1 - 1.0 K/uL   Eosinophils Relative 1 0 - 5 %   Eosinophils Absolute 0.0 0.0 - 0.7 K/uL   Basophils Relative 0 0 - 1 %   Basophils Absolute 0.0 0.0 - 0.1 K/uL  Imaging Review Dg Chest 2 View  11/11/2014   CLINICAL DATA:  facial swelling increasing, pt states tongue feels tingling, left elbow pain, bilateral leg swelling, HA for x 2 days, nausea/vomiting x 2 days  EXAM: CHEST  2 VIEW  COMPARISON:  03/14/2014  FINDINGS: Lung volumes are relatively low. There is linear opacity in the right upper lobe most consistent with  atelectasis. No lung consolidation or edema. No pleural effusion or pneumothorax.  Cardiac silhouette is normal in size and configuration. No mediastinal or hilar masses or convincing adenopathy. The bony thorax is unremarkable.  IMPRESSION: No acute cardiopulmonary disease.   Electronically Signed   By: Lajean Manes M.D.   On: 11/11/2014 18:14   Ct Head Wo Contrast  11/11/2014   CLINICAL DATA:  Complaining of headache for 2 days. Facial swelling and tongue tingling. Nausea and vomiting. History of hypertension and lupus.  EXAM: CT HEAD WITHOUT CONTRAST  CT MAXILLOFACIAL WITHOUT CONTRAST  TECHNIQUE: Multidetector CT imaging of the head and cervical spine was performed following the standard protocol without intravenous contrast. Multiplanar CT image reconstructions of the cervical spine were also generated.  COMPARISON:  06/22/2006  FINDINGS: CT HEAD FINDINGS  Ventricles are normal in size and configuration. There are no parenchymal masses or mass effect. There is no evidence of an infarct. There are no extra-axial masses or abnormal fluid collections.  There is no intracranial hemorrhage.  Visualized sinuses and mastoid air cells are clear. No skull lesion.  CT MAXILLOFACIAL FINDINGS  Mild nonspecific subcutaneous soft tissue edema. No soft tissue masses or adenopathy. Normal globes and orbits. Airway is widely patent.  Sinuses, mastoid air cells and middle ear cavities are clear.  No fracture.  No bone lesion.  There is significant dental disease. There multiple dental carie is in several missing teeth. Periapical lucency is noted most evident involving the last left maxillary molar. There is no adjacent soft tissue inflammation or fluid collection.  IMPRESSION: HEAD CT:  Normal.  MAXILLOFACIAL CT: Nonspecific mild soft tissue edema. No masses or adenopathy. There is significant dental disease, this does not appear to be the etiology of the nonspecific facial swelling.   Electronically Signed   By: Lajean Manes M.D.   On: 11/11/2014 18:27   Ct Maxillofacial Wo Cm  11/11/2014   CLINICAL DATA:  Complaining of headache for 2 days. Facial swelling and tongue tingling. Nausea and vomiting. History of hypertension and lupus.  EXAM: CT HEAD WITHOUT CONTRAST  CT MAXILLOFACIAL WITHOUT CONTRAST  TECHNIQUE: Multidetector CT imaging of the head and cervical spine was performed following the standard protocol without intravenous contrast. Multiplanar CT image reconstructions of the cervical spine were also generated.  COMPARISON:  06/22/2006  FINDINGS: CT HEAD FINDINGS  Ventricles are normal in size and configuration. There are no parenchymal masses or mass effect. There is no evidence of an infarct. There are no extra-axial masses or abnormal fluid collections.  There is no intracranial hemorrhage.  Visualized sinuses and mastoid air cells are clear. No skull lesion.  CT MAXILLOFACIAL FINDINGS  Mild nonspecific subcutaneous soft tissue edema. No soft tissue masses or adenopathy. Normal globes and orbits. Airway is widely patent.  Sinuses, mastoid air cells and middle ear cavities are clear.  No fracture.  No bone lesion.  There is significant dental disease. There multiple dental carie is in several missing teeth. Periapical lucency is noted most evident involving the last left maxillary molar. There is no adjacent soft tissue inflammation or fluid collection.  IMPRESSION: HEAD CT:  Normal.  MAXILLOFACIAL CT: Nonspecific mild soft tissue edema. No masses or adenopathy. There is significant dental disease, this does not appear to be the etiology of the nonspecific facial swelling.   Electronically Signed   By: Lajean Manes M.D.   On: 11/11/2014 18:27   I have personally reviewed and evaluated these images and lab results as part of my medical decision-making.   EKG Interpretation   Date/Time:  Friday November 11 2014 17:32:11 EDT Ventricular Rate:  84 PR Interval:  120 QRS Duration: 76 QT Interval:  393 QTC  Calculation: 465 R Axis:   61 Text Interpretation:  Sinus rhythm Baseline wander in lead(s) I II aVR aVL  No significant change since last tracing Confirmed by Kyona Chauncey  MD, Kiannah Grunow  ([id number]) on 11/11/2014 5:37:48 PM      MDM   Final diagnoses:  Lupus (systemic lupus erythematosus)    Patient here with a history of lupus. Patient has several complaints bilateral leg swelling joint pain leg pain facial swelling and a mild headache for 2 days and some nausea vomiting for 2 days. Patient without any fevers.  Workup head CT without any acute findings. CT face shows facial soft tissue swelling but no direct evidence of the source. Teeth do not seem to be the cause. Patient does not any tooth pain. In addition chest x-ray was negative. Labs without any significant abnormalities other than the fact that there is elevated platelets which is new. Spectral lites without and kicked the problem no significant anemia matter-of-fact hemoglobin is higher than it was a week ago. Patient improved with antinausea medicine pain medicine here will continue some pain medicine at home and have her follow-up with her regular doctor. Suspect that this is probably exacerbation of her lupus.    Fredia Sorrow, MD 11/11/14 2014

## 2014-11-16 DIAGNOSIS — R112 Nausea with vomiting, unspecified: Secondary | ICD-10-CM | POA: Diagnosis not present

## 2014-11-16 DIAGNOSIS — F209 Schizophrenia, unspecified: Secondary | ICD-10-CM | POA: Diagnosis not present

## 2014-11-16 DIAGNOSIS — M329 Systemic lupus erythematosus, unspecified: Secondary | ICD-10-CM | POA: Diagnosis not present

## 2014-11-16 DIAGNOSIS — D649 Anemia, unspecified: Secondary | ICD-10-CM | POA: Diagnosis not present

## 2014-11-16 DIAGNOSIS — K3184 Gastroparesis: Secondary | ICD-10-CM | POA: Diagnosis not present

## 2014-11-16 DIAGNOSIS — I10 Essential (primary) hypertension: Secondary | ICD-10-CM | POA: Diagnosis not present

## 2014-11-22 DIAGNOSIS — T814XXD Infection following a procedure, subsequent encounter: Secondary | ICD-10-CM | POA: Diagnosis not present

## 2014-11-22 DIAGNOSIS — R112 Nausea with vomiting, unspecified: Secondary | ICD-10-CM | POA: Diagnosis not present

## 2014-11-22 DIAGNOSIS — F17210 Nicotine dependence, cigarettes, uncomplicated: Secondary | ICD-10-CM | POA: Diagnosis not present

## 2014-11-22 DIAGNOSIS — E279 Disorder of adrenal gland, unspecified: Secondary | ICD-10-CM | POA: Diagnosis not present

## 2014-11-22 DIAGNOSIS — R1084 Generalized abdominal pain: Secondary | ICD-10-CM | POA: Diagnosis not present

## 2014-11-22 DIAGNOSIS — Z9049 Acquired absence of other specified parts of digestive tract: Secondary | ICD-10-CM | POA: Diagnosis not present

## 2014-11-22 DIAGNOSIS — B999 Unspecified infectious disease: Secondary | ICD-10-CM | POA: Diagnosis not present

## 2014-11-26 ENCOUNTER — Encounter (HOSPITAL_COMMUNITY): Payer: Self-pay | Admitting: Emergency Medicine

## 2014-11-26 ENCOUNTER — Emergency Department (HOSPITAL_COMMUNITY)
Admission: EM | Admit: 2014-11-26 | Discharge: 2014-11-26 | Disposition: A | Discharge status: Home / Self Care | Payer: Medicare Other | Attending: Emergency Medicine | Admitting: Emergency Medicine

## 2014-11-26 DIAGNOSIS — R1084 Generalized abdominal pain: Secondary | ICD-10-CM | POA: Diagnosis not present

## 2014-11-26 DIAGNOSIS — Z8659 Personal history of other mental and behavioral disorders: Secondary | ICD-10-CM | POA: Insufficient documentation

## 2014-11-26 DIAGNOSIS — I10 Essential (primary) hypertension: Secondary | ICD-10-CM | POA: Diagnosis not present

## 2014-11-26 DIAGNOSIS — Z90710 Acquired absence of both cervix and uterus: Secondary | ICD-10-CM | POA: Insufficient documentation

## 2014-11-26 DIAGNOSIS — Z7951 Long term (current) use of inhaled steroids: Secondary | ICD-10-CM | POA: Diagnosis not present

## 2014-11-26 DIAGNOSIS — Z915 Personal history of self-harm: Secondary | ICD-10-CM | POA: Insufficient documentation

## 2014-11-26 DIAGNOSIS — K219 Gastro-esophageal reflux disease without esophagitis: Secondary | ICD-10-CM | POA: Insufficient documentation

## 2014-11-26 DIAGNOSIS — R109 Unspecified abdominal pain: Secondary | ICD-10-CM | POA: Diagnosis not present

## 2014-11-26 DIAGNOSIS — Z720 Tobacco use: Secondary | ICD-10-CM | POA: Diagnosis not present

## 2014-11-26 DIAGNOSIS — Z86010 Personal history of colonic polyps: Secondary | ICD-10-CM | POA: Insufficient documentation

## 2014-11-26 DIAGNOSIS — R112 Nausea with vomiting, unspecified: Secondary | ICD-10-CM | POA: Insufficient documentation

## 2014-11-26 DIAGNOSIS — Z872 Personal history of diseases of the skin and subcutaneous tissue: Secondary | ICD-10-CM | POA: Insufficient documentation

## 2014-11-26 DIAGNOSIS — R638 Other symptoms and signs concerning food and fluid intake: Secondary | ICD-10-CM | POA: Diagnosis not present

## 2014-11-26 DIAGNOSIS — Z862 Personal history of diseases of the blood and blood-forming organs and certain disorders involving the immune mechanism: Secondary | ICD-10-CM | POA: Diagnosis not present

## 2014-11-26 DIAGNOSIS — G8929 Other chronic pain: Secondary | ICD-10-CM | POA: Diagnosis not present

## 2014-11-26 DIAGNOSIS — R000 Tachycardia, unspecified: Secondary | ICD-10-CM | POA: Insufficient documentation

## 2014-11-26 DIAGNOSIS — Z9049 Acquired absence of other specified parts of digestive tract: Secondary | ICD-10-CM | POA: Insufficient documentation

## 2014-11-26 DIAGNOSIS — Z8739 Personal history of other diseases of the musculoskeletal system and connective tissue: Secondary | ICD-10-CM | POA: Insufficient documentation

## 2014-11-26 DIAGNOSIS — Z79899 Other long term (current) drug therapy: Secondary | ICD-10-CM | POA: Diagnosis not present

## 2014-11-26 DIAGNOSIS — R100 Acute abdomen: Secondary | ICD-10-CM | POA: Diagnosis not present

## 2014-11-26 DIAGNOSIS — Z9889 Other specified postprocedural states: Secondary | ICD-10-CM | POA: Insufficient documentation

## 2014-11-26 LAB — URINALYSIS, ROUTINE W REFLEX MICROSCOPIC
Glucose, UA: NEGATIVE mg/dL
Ketones, ur: 15 mg/dL — AB
Leukocytes, UA: NEGATIVE
Nitrite: NEGATIVE
Protein, ur: 100 mg/dL — AB
Specific Gravity, Urine: >1.03 — ABNORMAL HIGH (ref 1.005–1.030)
Urobilinogen, UA: 0.2 mg/dL (ref 0.0–1.0)
pH: 5 (ref 5.0–8.0)

## 2014-11-26 LAB — COMPREHENSIVE METABOLIC PANEL
ALT: 44 U/L (ref 14–54)
AST: 63 U/L — ABNORMAL HIGH (ref 15–41)
Albumin: 4.6 g/dL (ref 3.5–5.0)
Alkaline Phosphatase: 131 U/L — ABNORMAL HIGH (ref 38–126)
Anion gap: 16 — ABNORMAL HIGH (ref 5–15)
BUN: 5 mg/dL — ABNORMAL LOW (ref 6–20)
CO2: 21 mmol/L — ABNORMAL LOW (ref 22–32)
Calcium: 9.2 mg/dL (ref 8.9–10.3)
Chloride: 100 mmol/L — ABNORMAL LOW (ref 101–111)
Creatinine, Ser: 0.43 mg/dL — ABNORMAL LOW (ref 0.44–1.00)
GFR calc Af Amer: >60 mL/min (ref >60)
GFR calc non Af Amer: >60 mL/min (ref >60)
Glucose, Bld: 140 mg/dL — ABNORMAL HIGH (ref 65–99)
Potassium: 3.8 mmol/L (ref 3.5–5.1)
Sodium: 137 mmol/L (ref 135–145)
Total Bilirubin: 0.8 mg/dL (ref 0.3–1.2)
Total Protein: 8.8 g/dL — ABNORMAL HIGH (ref 6.5–8.1)

## 2014-11-26 LAB — CBC WITH DIFFERENTIAL/PLATELET
Basophils Absolute: 0 10*3/uL (ref 0.0–0.1)
Basophils Relative: 0 % (ref 0–1)
Eosinophils Absolute: 0 10*3/uL (ref 0.0–0.7)
Eosinophils Relative: 1 % (ref 0–5)
HCT: 41.7 % (ref 36.0–46.0)
Hemoglobin: 14.9 g/dL (ref 12.0–15.0)
Lymphocytes Relative: 25 % (ref 12–46)
Lymphs Abs: 1.5 10*3/uL (ref 0.7–4.0)
MCH: 31 pg (ref 26.0–34.0)
MCHC: 35.7 g/dL (ref 30.0–36.0)
MCV: 86.9 fL (ref 78.0–100.0)
Monocytes Absolute: 0.4 10*3/uL (ref 0.1–1.0)
Monocytes Relative: 7 % (ref 3–12)
Neutro Abs: 4 10*3/uL (ref 1.7–7.7)
Neutrophils Relative %: 67 % (ref 43–77)
Platelets: 370 10*3/uL (ref 150–400)
RBC: 4.8 MIL/uL (ref 3.87–5.11)
RDW: 15.1 % (ref 11.5–15.5)
WBC: 6 10*3/uL (ref 4.0–10.5)

## 2014-11-26 LAB — LIPASE, BLOOD: Lipase: 10 U/L — ABNORMAL LOW (ref 22–51)

## 2014-11-26 LAB — URINE MICROSCOPIC-ADD ON

## 2014-11-26 MED ORDER — HYDROMORPHONE HCL 1 MG/ML IJ SOLN
1.0000 mg | Freq: Once | INTRAMUSCULAR | Status: AC
  Administered 2014-11-26: 1 mg via INTRAVENOUS
  Filled 2014-11-26: qty 1

## 2014-11-26 MED ORDER — HYDROMORPHONE HCL 2 MG/ML IJ SOLN
1.5000 mg | Freq: Once | INTRAMUSCULAR | Status: AC
  Administered 2014-11-26: 1.5 mg via INTRAVENOUS
  Filled 2014-11-26: qty 1

## 2014-11-26 MED ORDER — PROMETHAZINE HCL 25 MG/ML IJ SOLN
12.5000 mg | Freq: Once | INTRAMUSCULAR | Status: AC
  Administered 2014-11-26: 12.5 mg via INTRAVENOUS
  Filled 2014-11-26: qty 1

## 2014-11-26 MED ORDER — ONDANSETRON HCL 4 MG/2ML IJ SOLN
4.0000 mg | Freq: Once | INTRAMUSCULAR | Status: AC
  Administered 2014-11-26: 4 mg via INTRAVENOUS

## 2014-11-26 MED ORDER — SODIUM CHLORIDE 0.9 % IV BOLUS (SEPSIS)
1000.0000 mL | Freq: Once | INTRAVENOUS | Status: AC
  Administered 2014-11-26: 1000 mL via INTRAVENOUS

## 2014-11-26 MED ORDER — ONDANSETRON HCL 4 MG/2ML IJ SOLN
4.0000 mg | Freq: Once | INTRAMUSCULAR | Status: DC
  Filled 2014-11-26: qty 2

## 2014-11-26 NOTE — Discharge Instructions (Signed)
Abdominal Pain, Women Abdominal (stomach, pelvic, or belly) pain can be caused by many things. It is important to tell your doctor:  The location of the pain.  Does it come and go or is it present all the time?  Are there things that start the pain (eating certain foods, exercise)?  Are there other symptoms associated with the pain (fever, nausea, vomiting, diarrhea)? All of this is helpful to know when trying to find the cause of the pain. CAUSES   Stomach: virus or bacteria infection, or ulcer.  Intestine: appendicitis (inflamed appendix), regional ileitis (Crohn's disease), ulcerative colitis (inflamed colon), irritable bowel syndrome, diverticulitis (inflamed diverticulum of the colon), or cancer of the stomach or intestine.  Gallbladder disease or stones in the gallbladder.  Kidney disease, kidney stones, or infection.  Pancreas infection or cancer.  Fibromyalgia (pain disorder).  Diseases of the female organs:  Uterus: fibroid (non-cancerous) tumors or infection.  Fallopian tubes: infection or tubal pregnancy.  Ovary: cysts or tumors.  Pelvic adhesions (scar tissue).  Endometriosis (uterus lining tissue growing in the pelvis and on the pelvic organs).  Pelvic congestion syndrome (female organs filling up with blood just before the menstrual period).  Pain with the menstrual period.  Pain with ovulation (producing an egg).  Pain with an IUD (intrauterine device, birth control) in the uterus.  Cancer of the female organs.  Functional pain (pain not caused by a disease, may improve without treatment).  Psychological pain.  Depression. DIAGNOSIS  Your doctor will decide the seriousness of your pain by doing an examination.  Blood tests.  X-rays.  Ultrasound.  CT scan (computed tomography, special type of X-ray).  MRI (magnetic resonance imaging).  Cultures, for infection.  Barium enema (dye inserted in the large intestine, to better view it with  X-rays).  Colonoscopy (looking in intestine with a lighted tube).  Laparoscopy (minor surgery, looking in abdomen with a lighted tube).  Major abdominal exploratory surgery (looking in abdomen with a large incision). TREATMENT  The treatment will depend on the cause of the pain.   Many cases can be observed and treated at home.  Over-the-counter medicines recommended by your caregiver.  Prescription medicine.  Antibiotics, for infection.  Birth control pills, for painful periods or for ovulation pain.  Hormone treatment, for endometriosis.  Nerve blocking injections.  Physical therapy.  Antidepressants.  Counseling with a psychologist or psychiatrist.  Minor or major surgery. HOME CARE INSTRUCTIONS   Do not take laxatives, unless directed by your caregiver.  Take over-the-counter pain medicine only if ordered by your caregiver. Do not take aspirin because it can cause an upset stomach or bleeding.  Try a clear liquid diet (broth or water) as ordered by your caregiver. Slowly move to a bland diet, as tolerated, if the pain is related to the stomach or intestine.  Have a thermometer and take your temperature several times a day, and record it.  Bed rest and sleep, if it helps the pain.  Avoid sexual intercourse, if it causes pain.  Avoid stressful situations.  Keep your follow-up appointments and tests, as your caregiver orders.  If the pain does not go away with medicine or surgery, you may try:  Acupuncture.  Relaxation exercises (yoga, meditation).  Group therapy.  Counseling. SEEK MEDICAL CARE IF:   You notice certain foods cause stomach pain.  Your home care treatment is not helping your pain.  You need stronger pain medicine.  You want your IUD removed.  You feel faint or  lightheaded.  You develop nausea and vomiting.  You develop a rash.  You are having side effects or an allergy to your medicine. SEEK IMMEDIATE MEDICAL CARE IF:   Your  pain does not go away or gets worse.  You have a fever.  Your pain is felt only in portions of the abdomen. The right side could possibly be appendicitis. The left lower portion of the abdomen could be colitis or diverticulitis.  You are passing blood in your stools (bright red or black tarry stools, with or without vomiting).  You have blood in your urine.  You develop chills, with or without a fever.  You pass out. MAKE SURE YOU:   Understand these instructions.  Will watch your condition.  Will get help right away if you are not doing well or get worse. Document Released: 12/30/2006 Document Revised: 07/19/2013 Document Reviewed: 01/19/2009 [facility] Patient Information 2015 [place], [place]. This information is not intended to replace advice given to you by your health care provider. Make sure you discuss any questions you have with your health care provider.  Nausea and Vomiting Nausea means you feel sick to your stomach. Throwing up (vomiting) is a reflex where stomach contents come out of your mouth. HOME CARE   Take medicine as told by your doctor.  Do not force yourself to eat. However, you do need to drink fluids.  If you feel like eating, eat a normal diet as told by your doctor.  Eat rice, wheat, potatoes, bread, lean meats, yogurt, fruits, and vegetables.  Avoid high-fat foods.  Drink enough fluids to keep your pee (urine) clear or pale yellow.  Ask your doctor how to replace body fluid losses (rehydrate). Signs of body fluid loss (dehydration) include:  Feeling very thirsty.  Dry lips and mouth.  Feeling dizzy.  Dark pee.  Peeing less than normal.  Feeling confused.  Fast breathing or heart rate. GET HELP RIGHT AWAY IF:   You have blood in your throw up.  You have black or bloody poop (stool).  You have a bad headache or stiff neck.  You feel confused.  You have bad belly (abdominal) pain.  You have chest pain or trouble breathing.  You  do not pee at least once every 8 hours.  You have cold, clammy skin.  You keep throwing up after 24 to 48 hours.  You have a fever. MAKE SURE YOU:   Understand these instructions.  Will watch your condition.  Will get help right away if you are not doing well or get worse. Document Released: 08/21/2007 Document Revised: 05/27/2011 Document Reviewed: 08/03/2010 [facility] Patient Information 2015 [place], [place]. This information is not intended to replace advice given to you by your health care provider. Make sure you discuss any questions you have with your health care provider.

## 2014-11-26 NOTE — ED Notes (Signed)
Pt ambulatory to bathroom for urine sample.  Continues to c/o pain and nausea.

## 2014-11-26 NOTE — ED Notes (Signed)
Vomiting and diarrhea x 2 days, worse today.

## 2014-11-26 NOTE — ED Notes (Signed)
Pt states she is unable to urinate.

## 2014-11-26 NOTE — ED Notes (Signed)
Pt states that zofran is not helping.

## 2014-11-26 NOTE — ED Provider Notes (Signed)
CSN: [id number]     Arrival date & time 11/26/14  7672 History   First MD Initiated Contact with Patient 11/26/14 [phone]     Chief Complaint  Patient presents with  . Abdominal Pain     (Consider location/radiation/quality/duration/timing/severity/associated sxs/prior Treatment) HPI   49yF with abdominal pain, n/v and diarrhea. Onset two days ago. Persistent since. Reports unable to keep anything down. Diffuse, crampy abdominal pain. No fever or chills. No urinary complaints. No sick contacts. Extensive past medical/surgical history.   Past Medical History  Diagnosis Date  . Lupus   . 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 Hypertension   . Abscess     soft tissue  . Suicide attempt   . Blood transfusion without reported diagnosis   . Chronic abdominal pain   . Chronic wound infection of abdomen   . Adrenal mass   . Anxiety   . Depression   . GERD (gastroesophageal reflux disease)   . Sickle cell trait   . Gastroparesis Nov 2015  . Nausea and vomiting     chronic, recurrent  . Alcohol abuse   . History of Billroth II operation   . Lung nodule < 6cm on CT 04/25/2014  . Hemorrhoid     internal large  . Diverticulosis     colonoscopy 04/2014 moderat pan colonic  . Colon polyp     colonoscopy 04/2014  . Gastritis     EGD 05/2014  . Hiatal hernia   . Schatzki's ring     patent per EGD 04/2014  . Lung nodule     CT 02/2014 needs repeat 1 month   Past Surgical History  Procedure Laterality Date  . Abdominal surgery    . Abdominal hysterectomy  2013    [place]  . Debridement of abdominal wall abscess N/A 02/08/2013    Procedure: DEBRIDEMENT OF ABDOMINAL WALL ABSCESS;  Surgeon: Jamesetta So, MD;  Location: [facility];  Service: General;  Laterality: N/A;  . Billroth ii procedure       [place], first 2000, 2005/2006.  . Colonoscopy      in [place]  . Cholecystectomy    . Colonoscopy with propofol N/A 05/20/2013    Dr.Rourk- inadequate prep, normal appearing  rectum, grossly normal colon aside from pancolonic diverticula, normal terminal ileum bx= unremarkable colonic mucosa. Due for early interval 2016.   Marland Kitchen Esophagogastroduodenoscopy (egd) with propofol N/A 05/20/2013    Dr.Rourk- s/p prior gastric surgery with normal esophagus, residual gastric mucosa and patent efferent limb  . Esophageal biopsy  05/20/2013    Procedure: BIOPSIES OF ASCENDING AND SIGMOID COLON;  Surgeon: Daneil Dolin, MD;  Location: [facility];  Service: Endoscopy;;  . Wound exploration Right 06/24/2013    Procedure: exploration of traumatic wound right wrist;  Surgeon: Tennis Must, MD;  Location: [facility];  Service: Orthopedics;  Laterality: Right;  . Tendon repar Right     wrist  . Adrenal turmor removal    . Esophagogastroduodenoscopy (egd) with propofol N/A 02/03/2014    Dr. Gala Romney:  s/p hemigastrectomy with retained gastric contents. Residual gastric mucosa and efferent limb appeared normal otherwise. Query gastroparesis.   . Colonoscopy with propofol N/A 04/26/2014    [id number] ileum/one colon polyp removed/moderate pan-colonic diverticulosis/large internal hemorrhoids  . Esophagogastroduodenoscopy (egd) with propofol N/A 04/26/2014    [id number] ring/small HH/mild non-erosive gasrtitis/normal anastomosis  . Esophageal biopsy  04/26/2014    Procedure: BIOPSIES;  Surgeon: Danie Binder, MD;  Location:  [facility];  Service: Endoscopy;;   Family History  Problem Relation Age of Onset  . Brain cancer Son   . Schizophrenia Son   . Cancer Son     brain  . Breast cancer Maternal Aunt   . Bipolar disorder Maternal Aunt   . Drug abuse Maternal Aunt   . Colon cancer Maternal Grandmother     late 8s, early 30s  . Lung cancer Father   . Cancer Father     mets  . Liver disease Neg Hx   . Drug abuse Mother   . Drug abuse Sister   . Drug abuse Brother   . Bipolar disorder Paternal Grandfather   . Bipolar disorder Cousin    Social History  Substance Use Topics  . Smoking status:  Current Every Day Smoker -- 0.25 packs/day for 35 years    Types: Cigarettes  . Smokeless tobacco: Never Used  . Alcohol Use: 7.2 oz/week    12 Cans of beer, 0 Standard drinks or equivalent per week     Comment: ETOH abuse. as of 04/25/14, states drinking 1-2 beers per day   OB History    No data available     Review of Systems  All systems reviewed and negative, other than as noted in HPI.   Allergies  Codeine and Morphine and related  Home Medications   Prior to Admission medications   Medication Sig Start Date End Date Taking? Authorizing Provider  fluticasone (FLONASE) 50 MCG/ACT nasal spray Place 1 spray into both nostrils daily.    Historical Provider, MD  HYDROmorphone (DILAUDID) 4 MG tablet Take 1 tablet (4 mg total) by mouth every 6 (six) hours as needed for severe pain. 11/11/14   Fredia Sorrow, MD  ibuprofen (ADVIL,MOTRIN) 200 MG tablet Take 400 mg by mouth every 6 (six) hours as needed for mild pain or moderate pain.     Historical Provider, MD  lisinopril (PRINIVIL,ZESTRIL) 10 MG tablet Take 10 mg by mouth daily.    Historical Provider, MD  metoCLOPramide (REGLAN) 10 MG tablet Take 1 tablet (10 mg total) by mouth 4 (four) times daily -  before meals and at bedtime. 04/06/14   Radene Gunning, NP  mupirocin ointment (BACTROBAN) 2 % Place into the nose 2 (two) times daily. Patient taking differently: Place 1 application into the nose 2 (two) times daily.  12/13/13   Maryann Mikhail, DO  ondansetron (ZOFRAN) 8 MG tablet Take 1 tablet (8 mg total) by mouth every 8 (eight) hours as needed for nausea or vomiting. 09/04/14   Daleen Bo, MD  pantoprazole (PROTONIX) 40 MG tablet Take 1 tablet (40 mg total) by mouth 2 (two) times daily before a meal. Patient taking differently: Take 40 mg by mouth daily.  09/04/14   Daleen Bo, MD  sucralfate (CARAFATE) 1 G tablet Take 1 g by mouth 4 (four) times daily.    Historical Provider, MD   BP 154/107 mmHg  Pulse 139  Temp(Src) 98.1 F  (36.7 C) (Oral)  Resp 24  Ht 5\' 3"  (1.6 m)  Wt 131 lb (59.421 kg)  BMI 23.21 kg/m2  SpO2 98% Physical Exam  Constitutional: She appears well-developed and well-nourished. No distress.  HENT:  Head: Normocephalic and atraumatic.  Eyes: Conjunctivae are normal. Right eye exhibits no discharge. Left eye exhibits no discharge.  Neck: Neck supple.  Cardiovascular: Regular rhythm and normal heart sounds.  Exam reveals no gallop and no friction rub.   No murmur heard. tachycardic  Pulmonary/Chest: Effort normal and breath sounds normal. No respiratory distress.  Abdominal: Soft. She exhibits no distension. There is tenderness.  Multiple surgical scars. Two small areas lower abdomen with minimal drainage. Mild diffuse tenderness.   Musculoskeletal: She exhibits no edema or tenderness.  Neurological: She is alert.  Skin: Skin is warm and dry.  Psychiatric: She has a normal mood and affect. Her behavior is normal. Thought content normal.  Nursing note and vitals reviewed.   ED Course  Procedures (including critical care time) Labs Review Labs Reviewed  COMPREHENSIVE METABOLIC PANEL - Abnormal; Notable for the following:    Chloride 100 (*)    CO2 21 (*)    Glucose, Bld 140 (*)    BUN 5 (*)    Creatinine, Ser 0.43 (*)    Total Protein 8.8 (*)    AST 63 (*)    Alkaline Phosphatase 131 (*)    Anion gap 16 (*)    All other components within normal limits  LIPASE, BLOOD - Abnormal; Notable for the following:    Lipase 10 (*)    All other components within normal limits  URINALYSIS, ROUTINE W REFLEX MICROSCOPIC (NOT AT [facility]) - Abnormal; Notable for the following:    Specific Gravity, Urine >1.030 (*)    Hgb urine dipstick TRACE (*)    Bilirubin Urine SMALL (*)    Ketones, ur 15 (*)    Protein, ur 100 (*)    All other components within normal limits  URINE MICROSCOPIC-ADD ON - Abnormal; Notable for the following:    Squamous Epithelial / LPF MANY (*)    All other components within  normal limits  CBC WITH DIFFERENTIAL/PLATELET    Imaging Review No results found. I have personally reviewed and evaluated these images and lab results as part of my medical decision-making.   EKG Interpretation None      MDM   Final diagnoses:  Abdominal pain, unspecified abdominal location  Non-intractable vomiting with nausea, vomiting of unspecified type       Virgel Manifold, MD 12/09/14 0600

## 2014-11-26 NOTE — ED Notes (Signed)
Patient made aware a urine sample is need. Patient states she is unable to at this time. RN aware.

## 2014-12-01 DIAGNOSIS — M329 Systemic lupus erythematosus, unspecified: Secondary | ICD-10-CM | POA: Diagnosis not present

## 2014-12-19 ENCOUNTER — Ambulatory Visit (HOSPITAL_COMMUNITY): Payer: Self-pay | Admitting: Psychiatry

## 2014-12-21 ENCOUNTER — Inpatient Hospital Stay (HOSPITAL_COMMUNITY)
Admission: EM | Admit: 2014-12-21 | Discharge: 2014-12-25 | DRG: 379 | Disposition: A | Discharge status: Home / Self Care | Payer: Medicare Other | Attending: Internal Medicine | Admitting: Internal Medicine

## 2014-12-21 ENCOUNTER — Emergency Department (HOSPITAL_COMMUNITY): Payer: Medicare Other

## 2014-12-21 ENCOUNTER — Encounter (HOSPITAL_COMMUNITY): Payer: Self-pay | Admitting: Emergency Medicine

## 2014-12-21 DIAGNOSIS — Z720 Tobacco use: Secondary | ICD-10-CM | POA: Diagnosis present

## 2014-12-21 DIAGNOSIS — Z808 Family history of malignant neoplasm of other organs or systems: Secondary | ICD-10-CM

## 2014-12-21 DIAGNOSIS — L089 Local infection of the skin and subcutaneous tissue, unspecified: Secondary | ICD-10-CM | POA: Diagnosis present

## 2014-12-21 DIAGNOSIS — G8929 Other chronic pain: Secondary | ICD-10-CM | POA: Diagnosis present

## 2014-12-21 DIAGNOSIS — T814XXD Infection following a procedure, subsequent encounter: Secondary | ICD-10-CM | POA: Diagnosis not present

## 2014-12-21 DIAGNOSIS — K625 Hemorrhage of anus and rectum: Secondary | ICD-10-CM

## 2014-12-21 DIAGNOSIS — K922 Gastrointestinal hemorrhage, unspecified: Principal | ICD-10-CM | POA: Diagnosis present

## 2014-12-21 DIAGNOSIS — D573 Sickle-cell trait: Secondary | ICD-10-CM | POA: Diagnosis present

## 2014-12-21 DIAGNOSIS — I10 Essential (primary) hypertension: Secondary | ICD-10-CM | POA: Diagnosis present

## 2014-12-21 DIAGNOSIS — E0781 Sick-euthyroid syndrome: Secondary | ICD-10-CM | POA: Diagnosis present

## 2014-12-21 DIAGNOSIS — K5731 Diverticulosis of large intestine without perforation or abscess with bleeding: Secondary | ICD-10-CM | POA: Insufficient documentation

## 2014-12-21 DIAGNOSIS — F1010 Alcohol abuse, uncomplicated: Secondary | ICD-10-CM | POA: Diagnosis present

## 2014-12-21 DIAGNOSIS — F1411 Cocaine abuse, in remission: Secondary | ICD-10-CM | POA: Diagnosis present

## 2014-12-21 DIAGNOSIS — K760 Fatty (change of) liver, not elsewhere classified: Secondary | ICD-10-CM | POA: Diagnosis not present

## 2014-12-21 DIAGNOSIS — Z818 Family history of other mental and behavioral disorders: Secondary | ICD-10-CM

## 2014-12-21 DIAGNOSIS — F17210 Nicotine dependence, cigarettes, uncomplicated: Secondary | ICD-10-CM | POA: Diagnosis present

## 2014-12-21 DIAGNOSIS — K449 Diaphragmatic hernia without obstruction or gangrene: Secondary | ICD-10-CM | POA: Diagnosis present

## 2014-12-21 DIAGNOSIS — Z23 Encounter for immunization: Secondary | ICD-10-CM

## 2014-12-21 DIAGNOSIS — M329 Systemic lupus erythematosus, unspecified: Secondary | ICD-10-CM | POA: Diagnosis present

## 2014-12-21 DIAGNOSIS — Z801 Family history of malignant neoplasm of trachea, bronchus and lung: Secondary | ICD-10-CM

## 2014-12-21 DIAGNOSIS — K648 Other hemorrhoids: Secondary | ICD-10-CM | POA: Diagnosis present

## 2014-12-21 DIAGNOSIS — E079 Disorder of thyroid, unspecified: Secondary | ICD-10-CM | POA: Diagnosis present

## 2014-12-21 DIAGNOSIS — S31109A Unspecified open wound of abdominal wall, unspecified quadrant without penetration into peritoneal cavity, initial encounter: Secondary | ICD-10-CM

## 2014-12-21 DIAGNOSIS — S31109D Unspecified open wound of abdominal wall, unspecified quadrant without penetration into peritoneal cavity, subsequent encounter: Secondary | ICD-10-CM

## 2014-12-21 DIAGNOSIS — K210 Gastro-esophageal reflux disease with esophagitis: Secondary | ICD-10-CM | POA: Diagnosis present

## 2014-12-21 DIAGNOSIS — F17200 Nicotine dependence, unspecified, uncomplicated: Secondary | ICD-10-CM | POA: Diagnosis present

## 2014-12-21 DIAGNOSIS — Z800 Family history of malignant neoplasm of digestive organs: Secondary | ICD-10-CM

## 2014-12-21 DIAGNOSIS — Z803 Family history of malignant neoplasm of breast: Secondary | ICD-10-CM

## 2014-12-21 DIAGNOSIS — R109 Unspecified abdominal pain: Secondary | ICD-10-CM

## 2014-12-21 LAB — CBC WITH DIFFERENTIAL/PLATELET
BASOS ABS: 0 10*3/uL (ref 0.0–0.1)
Basophils Relative: 0 %
EOS ABS: 0 10*3/uL (ref 0.0–0.7)
EOS PCT: 1 %
HCT: 32.6 % — ABNORMAL LOW (ref 36.0–46.0)
Hemoglobin: 11.3 g/dL — ABNORMAL LOW (ref 12.0–15.0)
LYMPHS ABS: 3.6 10*3/uL (ref 0.7–4.0)
Lymphocytes Relative: 53 %
MCH: 30.5 pg (ref 26.0–34.0)
MCHC: 34.7 g/dL (ref 30.0–36.0)
MCV: 88.1 fL (ref 78.0–100.0)
MONO ABS: 0.6 10*3/uL (ref 0.1–1.0)
Monocytes Relative: 10 %
Neutro Abs: 2.4 10*3/uL (ref 1.7–7.7)
Neutrophils Relative %: 36 %
PLATELETS: 253 10*3/uL (ref 150–400)
RBC: 3.7 MIL/uL — AB (ref 3.87–5.11)
RDW: 15.9 % — AB (ref 11.5–15.5)
WBC: 6.7 10*3/uL (ref 4.0–10.5)

## 2014-12-21 LAB — COMPREHENSIVE METABOLIC PANEL
ALT: 19 U/L (ref 14–54)
ANION GAP: 12 (ref 5–15)
AST: 35 U/L (ref 15–41)
Albumin: 3.8 g/dL (ref 3.5–5.0)
Alkaline Phosphatase: 77 U/L (ref 38–126)
BUN: 11 mg/dL (ref 6–20)
CHLORIDE: 101 mmol/L (ref 101–111)
CO2: 24 mmol/L (ref 22–32)
CREATININE: 0.51 mg/dL (ref 0.44–1.00)
Calcium: 9 mg/dL (ref 8.9–10.3)
Glucose, Bld: 83 mg/dL (ref 65–99)
POTASSIUM: 4.3 mmol/L (ref 3.5–5.1)
SODIUM: 137 mmol/L (ref 135–145)
Total Bilirubin: 0.3 mg/dL (ref 0.3–1.2)
Total Protein: 7.6 g/dL (ref 6.5–8.1)

## 2014-12-21 NOTE — ED Notes (Signed)
Pt states rectal bleeding x 4 day, dark stools, bright red blood and clots, abdominal pain

## 2014-12-21 NOTE — ED Provider Notes (Signed)
CSN: [id number]     Arrival date & time 12/21/14  2141 History  By signing my name below, I, Hansel Feinstein, attest that this documentation has been prepared under the direction and in the presence of Delora Fuel, MD. Electronically Signed: Hansel Feinstein, ED Scribe. 12/21/2014. 11:39 PM.    Chief Complaint  Patient presents with  . Rectal Bleeding   The history is provided by the patient. No language interpreter was used.    HPI Comments: LAREN ORAMA is a 49 y.o. female with an extensive PMHx, who presents to the Emergency Department complaining of moderate, intermittent rectal bleeding with bright red blood onset 5 days ago and worsened today to be constant with increased blood volume and blood clots. Pt reports associated generalized fatigue, 0/09 umbilical abdominal pain, dizziness, lightheadedness. Pt notes a hx of similar bleeding lasting for a day at a time, but not as severe. She states that she was initially passing blood with every BM and had many episodes per day; now has the bleeding constantly and is wearing sanitary napkins. Pt also notes some drainage from an abdominal scar after debridement for 2 years since the initial surgery. Pt is a current drinker, one 8-12 oz beer daily. PCP is Dr. Legrand Rams. She denies nausea, fever, chills.   Past Medical History  Diagnosis Date  . Lupus ([place])   . 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 Hypertension   . Abscess     soft tissue  . Suicide attempt ([place])   . Blood transfusion without reported diagnosis   . Chronic abdominal pain   . Chronic wound infection of abdomen ([place])   . Adrenal mass ([place])   . Anxiety   . Depression   . GERD (gastroesophageal reflux disease)   . Sickle cell trait ([place])   . Gastroparesis Nov 2015  . Nausea and vomiting     chronic, recurrent  . Alcohol abuse   . History of Billroth II operation   . Lung nodule < 6cm on CT 04/25/2014  . Hemorrhoid     internal large  . Diverticulosis     colonoscopy  04/2014 moderat pan colonic  . Colon polyp     colonoscopy 04/2014  . Gastritis     EGD 05/2014  . Hiatal hernia   . Schatzki's ring     patent per EGD 04/2014  . Lung nodule     CT 02/2014 needs repeat 1 month   Past Surgical History  Procedure Laterality Date  . Abdominal surgery    . Abdominal hysterectomy  2013    [place]  . Debridement of abdominal wall abscess N/A 02/08/2013    Procedure: DEBRIDEMENT OF ABDOMINAL WALL ABSCESS;  Surgeon: Jamesetta So, MD;  Location: [facility];  Service: General;  Laterality: N/A;  . Billroth ii procedure       [place], first 2000, 2005/2006.  . Colonoscopy      in [place]  . Cholecystectomy    . Colonoscopy with propofol N/A 05/20/2013    Dr.Rourk- inadequate prep, normal appearing rectum, grossly normal colon aside from pancolonic diverticula, normal terminal ileum bx= unremarkable colonic mucosa. Due for early interval 2016.   Marland Kitchen Esophagogastroduodenoscopy (egd) with propofol N/A 05/20/2013    Dr.Rourk- s/p prior gastric surgery with normal esophagus, residual gastric mucosa and patent efferent limb  . Esophageal biopsy  05/20/2013    Procedure: BIOPSIES OF ASCENDING AND SIGMOID COLON;  Surgeon: Daneil Dolin, MD;  Location: [facility];  Service: Endoscopy;;  . Wound exploration Right 06/24/2013    Procedure: exploration of traumatic wound right wrist;  Surgeon: Tennis Must, MD;  Location: [facility];  Service: Orthopedics;  Laterality: Right;  . Tendon repar Right     wrist  . Adrenal turmor removal    . Esophagogastroduodenoscopy (egd) with propofol N/A 02/03/2014    Dr. Gala Romney:  s/p hemigastrectomy with retained gastric contents. Residual gastric mucosa and efferent limb appeared normal otherwise. Query gastroparesis.   . Colonoscopy with propofol N/A 04/26/2014    [id number] ileum/one colon polyp removed/moderate pan-colonic diverticulosis/large internal hemorrhoids  . Esophagogastroduodenoscopy (egd) with propofol N/A 04/26/2014    [id number]  ring/small HH/mild non-erosive gasrtitis/normal anastomosis  . Esophageal biopsy  04/26/2014    Procedure: BIOPSIES;  Surgeon: Danie Binder, MD;  Location: [facility];  Service: Endoscopy;;   Family History  Problem Relation Age of Onset  . Brain cancer Son   . Schizophrenia Son   . Cancer Son     brain  . Breast cancer Maternal Aunt   . Bipolar disorder Maternal Aunt   . Drug abuse Maternal Aunt   . Colon cancer Maternal Grandmother     late 43s, early 71s  . Lung cancer Father   . Cancer Father     mets  . Liver disease Neg Hx   . Drug abuse Mother   . Drug abuse Sister   . Drug abuse Brother   . Bipolar disorder Paternal Grandfather   . Bipolar disorder Cousin    Social History  Substance Use Topics  . Smoking status: Current Every Day Smoker -- 0.25 packs/day for 35 years    Types: Cigarettes  . Smokeless tobacco: Never Used  . Alcohol Use: 7.2 oz/week    12 Cans of beer, 0 Standard drinks or equivalent per week     Comment: ETOH abuse. as of 04/25/14, states drinking 1-2 beers per day   OB History    No data available     Review of Systems  Constitutional: Positive for fatigue. Negative for fever and chills.  Gastrointestinal: Positive for abdominal pain and blood in stool. Negative for nausea.  Neurological: Positive for dizziness and light-headedness.  All other systems reviewed and are negative.  Allergies  Codeine and Morphine and related  Home Medications   Prior to Admission medications   Medication Sig Start Date End Date Taking? Authorizing Provider  fluticasone (FLONASE) 50 MCG/ACT nasal spray Place 1 spray into both nostrils daily.   Yes Historical Provider, MD  lisinopril (PRINIVIL,ZESTRIL) 10 MG tablet Take 10 mg by mouth daily.   Yes Historical Provider, MD  metoCLOPramide (REGLAN) 10 MG tablet Take 1 tablet (10 mg total) by mouth 4 (four) times daily -  before meals and at bedtime. 04/06/14  Yes Lezlie Octave Black, NP  pantoprazole (PROTONIX) 40 MG tablet  Take 1 tablet (40 mg total) by mouth 2 (two) times daily before a meal. Patient taking differently: Take 40 mg by mouth daily.  09/04/14  Yes Daleen Bo, MD  sucralfate (CARAFATE) 1 G tablet Take 1 g by mouth 4 (four) times daily.   Yes Historical Provider, MD  HYDROmorphone (DILAUDID) 4 MG tablet Take 1 tablet (4 mg total) by mouth every 6 (six) hours as needed for severe pain. Patient not taking: Reported on 11/26/2014 11/11/14   Fredia Sorrow, MD  ibuprofen (ADVIL,MOTRIN) 200 MG tablet Take 400 mg by mouth every 6 (six) hours as needed for mild pain or moderate  pain.     Historical Provider, MD  mupirocin ointment (BACTROBAN) 2 % Place into the nose 2 (two) times daily. Patient not taking: Reported on 12/21/2014 12/13/13   Velta Addison Mikhail, DO  ondansetron (ZOFRAN) 8 MG tablet Take 1 tablet (8 mg total) by mouth every 8 (eight) hours as needed for nausea or vomiting. 09/04/14   Daleen Bo, MD   BP 103/75 mmHg  Pulse 90  Temp(Src) 97.6 F (36.4 C) (Oral)  Resp 20  Ht 5' (1.524 m)  Wt 131 lb (59.421 kg)  BMI 25.58 kg/m2  SpO2 95% Physical Exam  Constitutional: She is oriented to person, place, and time. She appears well-developed and well-nourished.  HENT:  Head: Normocephalic and atraumatic.  Eyes: Conjunctivae and EOM are normal. Pupils are equal, round, and reactive to light.  Neck: Normal range of motion. Neck supple. No JVD present.  Cardiovascular: Normal rate, regular rhythm and normal heart sounds.   No murmur heard. Pulmonary/Chest: Effort normal and breath sounds normal. She has no wheezes. She has no rales. She exhibits no tenderness.  Abdominal: Soft. She exhibits no distension and no mass. There is tenderness. There is no rebound and no guarding.  Retracted midline scar with some greenish drainage coming from the inferior aspect of the scar. Scar, which appears to be from prior drain placement, about 2cm to the left of the drainage site from the midline scar- also with  green drainage. Mild tenderness to palpation diffusely. No rebound, no guarding.   Genitourinary:  Rectal; Small external skin tags, small amount of bright red blood. Hemoccult positive.   Musculoskeletal: Normal range of motion. She exhibits no edema.  Lymphadenopathy:    She has no cervical adenopathy.  Neurological: She is alert and oriented to person, place, and time. No cranial nerve deficit. She exhibits normal muscle tone. Coordination normal.  Skin: Skin is warm and dry. No rash noted.  Psychiatric: She has a normal mood and affect. Her behavior is normal. Judgment and thought content normal.  Nursing note and vitals reviewed.   ED Course  Procedures (including critical care time) DIAGNOSTIC STUDIES: Oxygen Saturation is 100% on RA, normal by my interpretation.    COORDINATION OF CARE: 11:30 PM Discussed treatment plan with pt at bedside and pt agreed to plan.   Labs Review Results for orders placed or performed during the hospital encounter of 12/21/14  CBC with Differential  Result Value Ref Range   WBC 6.7 4.0 - 10.5 K/uL   RBC 3.70 (L) 3.87 - 5.11 MIL/uL   Hemoglobin 11.3 (L) 12.0 - 15.0 g/dL   HCT 32.6 (L) 36.0 - 46.0 %   MCV 88.1 78.0 - 100.0 fL   MCH 30.5 26.0 - 34.0 pg   MCHC 34.7 30.0 - 36.0 g/dL   RDW 15.9 (H) 11.5 - 15.5 %   Platelets 253 150 - 400 K/uL   Neutrophils Relative % 36 %   Neutro Abs 2.4 1.7 - 7.7 K/uL   Lymphocytes Relative 53 %   Lymphs Abs 3.6 0.7 - 4.0 K/uL   Monocytes Relative 10 %   Monocytes Absolute 0.6 0.1 - 1.0 K/uL   Eosinophils Relative 1 %   Eosinophils Absolute 0.0 0.0 - 0.7 K/uL   Basophils Relative 0 %   Basophils Absolute 0.0 0.0 - 0.1 K/uL  Comprehensive metabolic panel  Result Value Ref Range   Sodium 137 135 - 145 mmol/L   Potassium 4.3 3.5 - 5.1 mmol/L   Chloride 101 101 -  111 mmol/L   CO2 24 22 - 32 mmol/L   Glucose, Bld 83 65 - 99 mg/dL   BUN 11 6 - 20 mg/dL   Creatinine, Ser 0.51 0.44 - 1.00 mg/dL   Calcium 9.0  8.9 - 10.3 mg/dL   Total Protein 7.6 6.5 - 8.1 g/dL   Albumin 3.8 3.5 - 5.0 g/dL   AST 35 15 - 41 U/L   ALT 19 14 - 54 U/L   Alkaline Phosphatase 77 38 - 126 U/L   Total Bilirubin 0.3 0.3 - 1.2 mg/dL   GFR calc non Af Amer >60 >60 mL/min   GFR calc Af Amer >60 >60 mL/min   Anion gap 12 5 - 15  Type and screen  Result Value Ref Range   ABO/RH(D) O POS    Antibody Screen PENDING    Sample Expiration 12/24/2014     Imaging Review Ct Abdomen Pelvis W Contrast  12/22/2014   CLINICAL DATA:  Intermittent bright red blood per rectum, onset 5 days ago and worsened today.  EXAM: CT ABDOMEN AND PELVIS WITH CONTRAST  TECHNIQUE: Multidetector CT imaging of the abdomen and pelvis was performed using the standard protocol following bolus administration of intravenous contrast.  CONTRAST:  29mL OMNIPAQUE IOHEXOL 300 MG/ML SOLN, 11mL OMNIPAQUE IOHEXOL 300 MG/ML SOLN  COMPARISON:  05/22/2014, 06/06/2013  FINDINGS: Lower chest:  No significant abnormality.  Hepatobiliary: Mild hepatic fatty infiltration. Cholecystectomy. Unremarkable bile ducts.  Pancreas: Normal  Spleen: Normal  Adrenals/Urinary Tract: Prior right adrenalectomy. Unchanged 1.9 cm low-attenuation left adrenal nodule, consistent with adenomas. The kidneys, ureters and bladder are unremarkable.  Stomach/Bowel: Prior gastric surgery. Unremarkable small bowel and colon.  Vascular/Lymphatic: The abdominal aorta is normal in caliber. There is mild atherosclerotic calcification. There is no adenopathy in the abdomen or pelvis.  Reproductive: Prior hysterectomy.  No adnexal abnormalities.  Other: Postsurgical changes of the anterior abdominal wall remain unchanged from 06/06/2013. No drainable collection is evident.  Musculoskeletal: No significant musculoskeletal lesion.  IMPRESSION: No acute findings are evident in the abdomen or pelvis. There is mild fatty infiltration of the liver. There is an unchanged benign appearing 1.9 cm left adrenal nodule. There  are unchanged postoperative irregularities with soft tissue thickening in the anterior abdominal wall.   Electronically Signed   By: Andreas Newport M.D.   On: 12/22/2014 00:38   I have personally reviewed and evaluated these images and lab results as part of my medical decision-making.   MDM   Final diagnoses:  Rectal bleeding  Chronic wound infection of abdomen, subsequent encounter    Rectal bleeding. Old records are reviewed and she has prior hospitalizations for rectal bleeding. She has history of alcohol abuse part time, by history, her nieces much diminished and she is only claiming 12 ounces of beer a day. She has also had problems with chronic infection of her abdominal scars and did have a surgical debridement of the scar is 2 years ago and has been followed in infectious disease clinic. Most recent hemoglobin was over 14, 1 month ago, and has dropped to 11.3 today. She will need to be admitted for serial hemoglobins and possible transfusions. Because of generalized abdominal pain, she will be sent for CT scan.  CT is unremarkable. Orthostatic vital signs show a rise in pulse, but less than 20 bpm change and no change in blood pressure. Case is discussed with Dr. Arnoldo Morale of [facility] hospitalist who agrees to admit the patient.  I, Nicle Connole, personally performed the  services described in this documentation. All medical record entries made by the scribe were at my direction and in my presence.  I have reviewed the chart and discharge instructions and agree that the record reflects my personal performance and is accurate and complete. Jeydan Barner.  12/22/2014. 12:30 AM.      Delora Fuel, MD 71/24/58 0998

## 2014-12-22 ENCOUNTER — Encounter (HOSPITAL_COMMUNITY): Payer: Self-pay | Admitting: *Deleted

## 2014-12-22 DIAGNOSIS — F1010 Alcohol abuse, uncomplicated: Secondary | ICD-10-CM | POA: Diagnosis not present

## 2014-12-22 DIAGNOSIS — K449 Diaphragmatic hernia without obstruction or gangrene: Secondary | ICD-10-CM | POA: Diagnosis present

## 2014-12-22 DIAGNOSIS — R109 Unspecified abdominal pain: Secondary | ICD-10-CM

## 2014-12-22 DIAGNOSIS — K210 Gastro-esophageal reflux disease with esophagitis: Secondary | ICD-10-CM | POA: Diagnosis present

## 2014-12-22 DIAGNOSIS — E079 Disorder of thyroid, unspecified: Secondary | ICD-10-CM

## 2014-12-22 DIAGNOSIS — K760 Fatty (change of) liver, not elsewhere classified: Secondary | ICD-10-CM | POA: Diagnosis not present

## 2014-12-22 DIAGNOSIS — M329 Systemic lupus erythematosus, unspecified: Secondary | ICD-10-CM | POA: Diagnosis present

## 2014-12-22 DIAGNOSIS — S31109A Unspecified open wound of abdominal wall, unspecified quadrant without penetration into peritoneal cavity, initial encounter: Secondary | ICD-10-CM | POA: Diagnosis not present

## 2014-12-22 DIAGNOSIS — Z23 Encounter for immunization: Secondary | ICD-10-CM | POA: Diagnosis not present

## 2014-12-22 DIAGNOSIS — K922 Gastrointestinal hemorrhage, unspecified: Secondary | ICD-10-CM | POA: Diagnosis not present

## 2014-12-22 DIAGNOSIS — Z720 Tobacco use: Secondary | ICD-10-CM

## 2014-12-22 DIAGNOSIS — D649 Anemia, unspecified: Secondary | ICD-10-CM | POA: Diagnosis not present

## 2014-12-22 DIAGNOSIS — G8929 Other chronic pain: Secondary | ICD-10-CM

## 2014-12-22 DIAGNOSIS — Z808 Family history of malignant neoplasm of other organs or systems: Secondary | ICD-10-CM | POA: Diagnosis not present

## 2014-12-22 DIAGNOSIS — Z801 Family history of malignant neoplasm of trachea, bronchus and lung: Secondary | ICD-10-CM | POA: Diagnosis not present

## 2014-12-22 DIAGNOSIS — L089 Local infection of the skin and subcutaneous tissue, unspecified: Secondary | ICD-10-CM

## 2014-12-22 DIAGNOSIS — D573 Sickle-cell trait: Secondary | ICD-10-CM | POA: Diagnosis present

## 2014-12-22 DIAGNOSIS — F17210 Nicotine dependence, cigarettes, uncomplicated: Secondary | ICD-10-CM | POA: Diagnosis present

## 2014-12-22 DIAGNOSIS — Z818 Family history of other mental and behavioral disorders: Secondary | ICD-10-CM | POA: Diagnosis not present

## 2014-12-22 DIAGNOSIS — K648 Other hemorrhoids: Secondary | ICD-10-CM | POA: Diagnosis present

## 2014-12-22 DIAGNOSIS — K5731 Diverticulosis of large intestine without perforation or abscess with bleeding: Secondary | ICD-10-CM | POA: Diagnosis not present

## 2014-12-22 DIAGNOSIS — Z800 Family history of malignant neoplasm of digestive organs: Secondary | ICD-10-CM | POA: Diagnosis not present

## 2014-12-22 DIAGNOSIS — I10 Essential (primary) hypertension: Secondary | ICD-10-CM | POA: Diagnosis present

## 2014-12-22 DIAGNOSIS — K625 Hemorrhage of anus and rectum: Secondary | ICD-10-CM

## 2014-12-22 DIAGNOSIS — E0781 Sick-euthyroid syndrome: Secondary | ICD-10-CM | POA: Diagnosis present

## 2014-12-22 DIAGNOSIS — F1410 Cocaine abuse, uncomplicated: Secondary | ICD-10-CM

## 2014-12-22 DIAGNOSIS — Z803 Family history of malignant neoplasm of breast: Secondary | ICD-10-CM | POA: Diagnosis not present

## 2014-12-22 LAB — BASIC METABOLIC PANEL
Anion gap: 10 (ref 5–15)
BUN: 9 mg/dL (ref 6–20)
CHLORIDE: 105 mmol/L (ref 101–111)
CO2: 21 mmol/L — ABNORMAL LOW (ref 22–32)
CREATININE: 0.45 mg/dL (ref 0.44–1.00)
Calcium: 8 mg/dL — ABNORMAL LOW (ref 8.9–10.3)
GFR calc non Af Amer: >60 mL/min (ref >60)
Glucose, Bld: 54 mg/dL — ABNORMAL LOW (ref 65–99)
POTASSIUM: 4 mmol/L (ref 3.5–5.1)
SODIUM: 136 mmol/L (ref 135–145)

## 2014-12-22 LAB — TYPE AND SCREEN
ABO/RH(D): O POS
ANTIBODY SCREEN: NEGATIVE

## 2014-12-22 LAB — HEMOGLOBIN AND HEMATOCRIT, BLOOD
HCT: 32.3 % — ABNORMAL LOW (ref 36.0–46.0)
HCT: 30.4 % — ABNORMAL LOW (ref 36.0–46.0)
HEMOGLOBIN: 10.6 g/dL — AB (ref 12.0–15.0)
Hemoglobin: 11.3 g/dL — ABNORMAL LOW (ref 12.0–15.0)

## 2014-12-22 LAB — CBC
HCT: 29.8 % — ABNORMAL LOW (ref 36.0–46.0)
HCT: 30.1 % — ABNORMAL LOW (ref 36.0–46.0)
HCT: 30.2 % — ABNORMAL LOW (ref 36.0–46.0)
Hemoglobin: 10.3 g/dL — ABNORMAL LOW (ref 12.0–15.0)
Hemoglobin: 10.5 g/dL — ABNORMAL LOW (ref 12.0–15.0)
Hemoglobin: 10.5 g/dL — ABNORMAL LOW (ref 12.0–15.0)
MCH: 30.2 pg (ref 26.0–34.0)
MCH: 30.4 pg (ref 26.0–34.0)
MCH: 30.7 pg (ref 26.0–34.0)
MCHC: 34.6 g/dL (ref 30.0–36.0)
MCHC: 34.9 g/dL (ref 30.0–36.0)
MCHC: 34.8 g/dL (ref 30.0–36.0)
MCV: 87.4 fL (ref 78.0–100.0)
MCV: 87.2 fL (ref 78.0–100.0)
MCV: 88.3 fL (ref 78.0–100.0)
PLATELETS: 211 10*3/uL (ref 150–400)
PLATELETS: 216 10*3/uL (ref 150–400)
Platelets: 217 10*3/uL (ref 150–400)
RBC: 3.41 MIL/uL — AB (ref 3.87–5.11)
RBC: 3.45 MIL/uL — ABNORMAL LOW (ref 3.87–5.11)
RBC: 3.42 MIL/uL — ABNORMAL LOW (ref 3.87–5.11)
RDW: 15.7 % — ABNORMAL HIGH (ref 11.5–15.5)
RDW: 16 % — AB (ref 11.5–15.5)
RDW: 16 % — AB (ref 11.5–15.5)
WBC: 4.2 10*3/uL (ref 4.0–10.5)
WBC: 4.8 10*3/uL (ref 4.0–10.5)
WBC: 4.2 10*3/uL (ref 4.0–10.5)

## 2014-12-22 LAB — MRSA PCR SCREENING: MRSA BY PCR: NEGATIVE

## 2014-12-22 LAB — TSH: TSH: 0.934 u[IU]/mL (ref 0.350–4.500)

## 2014-12-22 MED ORDER — SODIUM CHLORIDE 0.9 % IV SOLN: INTRAVENOUS | Status: AC

## 2014-12-22 MED ORDER — ACETAMINOPHEN 650 MG RE SUPP: 650.0000 mg | Freq: Four times a day (QID) | RECTAL | Status: DC | PRN

## 2014-12-22 MED ORDER — DIPHENHYDRAMINE HCL 25 MG PO CAPS
25.0000 mg | ORAL_CAPSULE | Freq: Once | ORAL | Status: AC
  Administered 2014-12-22: 25 mg via ORAL
  Filled 2014-12-22: qty 1

## 2014-12-22 MED ORDER — HYDROMORPHONE HCL 1 MG/ML IJ SOLN
1.0000 mg | INTRAMUSCULAR | Status: AC | PRN
  Filled 2014-12-22 (×2): qty 1

## 2014-12-22 MED ORDER — ADULT MULTIVITAMIN W/MINERALS CH
1.0000 | ORAL_TABLET | Freq: Every day | ORAL | Status: DC
  Administered 2014-12-22 – 2014-12-25 (×4): 1 via ORAL
  Filled 2014-12-22 (×4): qty 1

## 2014-12-22 MED ORDER — IOHEXOL 300 MG/ML  SOLN
50.0000 mL | Freq: Once | INTRAMUSCULAR | Status: AC | PRN
  Administered 2014-12-22: 50 mL via ORAL

## 2014-12-22 MED ORDER — FOLIC ACID 1 MG PO TABS
1.0000 mg | ORAL_TABLET | Freq: Every day | ORAL | Status: DC
  Administered 2014-12-22 – 2014-12-25 (×4): 1 mg via ORAL
  Filled 2014-12-22 (×4): qty 1

## 2014-12-22 MED ORDER — LORAZEPAM 1 MG PO TABS
1.0000 mg | ORAL_TABLET | Freq: Four times a day (QID) | ORAL | Status: AC | PRN
  Administered 2014-12-22 – 2014-12-24 (×2): 1 mg via ORAL
  Filled 2014-12-22: qty 1

## 2014-12-22 MED ORDER — HYDROMORPHONE HCL 1 MG/ML IJ SOLN
1.0000 mg | Freq: Once | INTRAMUSCULAR | Status: AC
  Administered 2014-12-22: 1 mg via INTRAVENOUS
  Filled 2014-12-22: qty 1

## 2014-12-22 MED ORDER — THIAMINE HCL 100 MG/ML IJ SOLN
100.0000 mg | Freq: Every day | INTRAMUSCULAR | Status: DC
  Administered 2014-12-22 – 2014-12-24 (×2): 100 mg via INTRAVENOUS
  Filled 2014-12-22 (×2): qty 2

## 2014-12-22 MED ORDER — ONDANSETRON HCL 4 MG/2ML IJ SOLN
4.0000 mg | Freq: Once | INTRAMUSCULAR | Status: AC
  Administered 2014-12-22: 4 mg via INTRAVENOUS
  Filled 2014-12-22: qty 2

## 2014-12-22 MED ORDER — VITAMIN B-1 100 MG PO TABS
100.0000 mg | ORAL_TABLET | Freq: Every day | ORAL | Status: DC
  Administered 2014-12-23 – 2014-12-25 (×2): 100 mg via ORAL
  Filled 2014-12-22 (×3): qty 1

## 2014-12-22 MED ORDER — LORAZEPAM 2 MG/ML IJ SOLN
1.0000 mg | Freq: Four times a day (QID) | INTRAMUSCULAR | Status: AC | PRN
  Administered 2014-12-22: 1 mg via INTRAVENOUS
  Filled 2014-12-22: qty 1

## 2014-12-22 MED ORDER — ALUM & MAG HYDROXIDE-SIMETH 200-200-20 MG/5ML PO SUSP: 30.0000 mL | Freq: Four times a day (QID) | ORAL | Status: DC | PRN

## 2014-12-22 MED ORDER — IOHEXOL 300 MG/ML  SOLN
100.0000 mL | Freq: Once | INTRAMUSCULAR | Status: DC | PRN
  Administered 2014-12-22: 100 mL via INTRAVENOUS
  Filled 2014-12-22: qty 100

## 2014-12-22 MED ORDER — NICOTINE 7 MG/24HR TD PT24
7.0000 mg | MEDICATED_PATCH | Freq: Every day | TRANSDERMAL | Status: DC
  Administered 2014-12-22 – 2014-12-25 (×4): 7 mg via TRANSDERMAL
  Filled 2014-12-22 (×5): qty 1

## 2014-12-22 MED ORDER — PEG 3350-KCL-NA BICARB-NACL 420 G PO SOLR
2000.0000 mL | Freq: Once | ORAL | Status: AC
  Administered 2014-12-23: 2000 mL via ORAL

## 2014-12-22 MED ORDER — ONDANSETRON HCL 4 MG PO TABS
4.0000 mg | ORAL_TABLET | Freq: Four times a day (QID) | ORAL | Status: DC | PRN
Start: 2014-12-22 — End: 2014-12-25
  Administered 2014-12-24: 4 mg via ORAL
  Filled 2014-12-22: qty 1

## 2014-12-22 MED ORDER — PEG 3350-KCL-NA BICARB-NACL 420 G PO SOLR
2000.0000 mL | Freq: Once | ORAL | Status: DC
  Filled 2014-12-22: qty 4000

## 2014-12-22 MED ORDER — INFLUENZA VAC SPLIT QUAD 0.5 ML IM SUSY
0.5000 mL | PREFILLED_SYRINGE | INTRAMUSCULAR | Status: AC
  Administered 2014-12-23: 0.5 mL via INTRAMUSCULAR
  Filled 2014-12-22: qty 0.5

## 2014-12-22 MED ORDER — ACETAMINOPHEN 325 MG PO TABS: 650.0000 mg | ORAL_TABLET | Freq: Four times a day (QID) | ORAL | Status: DC | PRN

## 2014-12-22 MED ORDER — PANTOPRAZOLE SODIUM 40 MG IV SOLR
40.0000 mg | Freq: Two times a day (BID) | INTRAVENOUS | Status: DC
  Administered 2014-12-22 (×3): 40 mg via INTRAVENOUS
  Filled 2014-12-22 (×3): qty 40

## 2014-12-22 MED ORDER — SODIUM CHLORIDE 0.9 % IV SOLN
INTRAVENOUS | Status: DC
  Administered 2014-12-22 – 2014-12-24 (×6): via INTRAVENOUS

## 2014-12-22 MED ORDER — LORAZEPAM 1 MG PO TABS
0.0000 mg | ORAL_TABLET | Freq: Two times a day (BID) | ORAL | Status: DC
  Administered 2014-12-23: 4 mg via ORAL
  Administered 2014-12-24: 1 mg via ORAL
  Filled 2014-12-22: qty 4
  Filled 2014-12-22: qty 1

## 2014-12-22 MED ORDER — NICOTINE 7 MG/24HR TD PT24
MEDICATED_PATCH | TRANSDERMAL | Status: AC
  Filled 2014-12-22: qty 1

## 2014-12-22 MED ORDER — ONDANSETRON HCL 4 MG/2ML IJ SOLN: 4.0000 mg | Freq: Three times a day (TID) | INTRAMUSCULAR | Status: AC | PRN

## 2014-12-22 MED ORDER — ONDANSETRON HCL 4 MG/2ML IJ SOLN
4.0000 mg | Freq: Four times a day (QID) | INTRAMUSCULAR | Status: DC | PRN
  Administered 2014-12-22 – 2014-12-25 (×7): 4 mg via INTRAVENOUS
  Filled 2014-12-22 (×8): qty 2

## 2014-12-22 MED ORDER — HYDROMORPHONE HCL 1 MG/ML IJ SOLN
0.5000 mg | INTRAMUSCULAR | Status: DC | PRN
  Administered 2014-12-22 – 2014-12-25 (×20): 1 mg via INTRAVENOUS
  Filled 2014-12-22 (×18): qty 1

## 2014-12-22 MED ORDER — LORAZEPAM 1 MG PO TABS
0.0000 mg | ORAL_TABLET | Freq: Four times a day (QID) | ORAL | Status: AC
Start: 2014-12-22 — End: 2014-12-23
  Administered 2014-12-22 – 2014-12-23 (×4): 1 mg via ORAL
  Filled 2014-12-22 (×4): qty 1

## 2014-12-22 NOTE — H&P (Signed)
[facility] Admission History and Physical       Angel Price UMP:[id number] DOB: 04/09/1965 DOA: 12/21/2014  Referring physician: EDP PCP: Rosita Fire, MD  Specialists:   Chief Complaint: Rectal Bleeding  HPI: Angel Price is a 49 y.o. female with a history of SLE, Alcohol and Tobacco Abuse who presents to the ED with complaints of BRBPR x 5 days.  She reports passing clots as well. She has had SOB, weakness and fatique.  She reports  having chronic diffuse ABD pain which is unchanged.    She denies any fever or chills or chest pain.   Her initial hemoglobin was found at   Review of Systems:  Constitutional: No Weight Loss, No Weight Gain, Night Sweats, Fevers, Chills, Dizziness, Light Headedness, +Fatigue, or +Generalized Weakness HEENT: No Headaches, Difficulty Swallowing,Tooth/Dental Problems,Sore Throat,  No Sneezing, Rhinitis, Ear Ache, Nasal Congestion, or Post Nasal Drip,  Cardio-vascular:  No Chest pain, Orthopnea, PND, Edema in Lower Extremities, Anasarca, Dizziness, Palpitations  Resp: +Dyspnea, No DOE, No Productive Cough, No Non-Productive Cough, No Hemoptysis, No Wheezing.    GI: No Heartburn, Indigestion, +Abdominal Pain, Nausea, Vomiting, Diarrhea, Constipation, Hematemesis, Hematochezia, Melena, Change in Bowel Habits,  Loss of Appetite  GU: No Dysuria, No Change in Color of Urine, No Urgency or Urinary Frequency, No Flank pain.  Musculoskeletal: No Joint Pain or Swelling, No Decreased Range of Motion, No Back Pain.  Neurologic: No Syncope, No Seizures, Muscle Weakness, Paresthesia, Vision Disturbance or Loss, No Diplopia, No Vertigo, No Difficulty Walking,  Skin: No Rash or Lesions. Psych: No Change in Mood or Affect, No Depression or Anxiety, No Memory loss, No Confusion, or Hallucinations   Past Medical History  Diagnosis Date  . Lupus ([place])   . 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 Hypertension   . Abscess     soft tissue  .  Suicide attempt ([place])   . Blood transfusion without reported diagnosis   . Chronic abdominal pain   . Chronic wound infection of abdomen ([place])   . Adrenal mass ([place])   . Anxiety   . Depression   . GERD (gastroesophageal reflux disease)   . Sickle cell trait ([place])   . Gastroparesis Nov 2015  . Nausea and vomiting     chronic, recurrent  . Alcohol abuse   . History of Billroth II operation   . Lung nodule < 6cm on CT 04/25/2014  . Hemorrhoid     internal large  . Diverticulosis     colonoscopy 04/2014 moderat pan colonic  . Colon polyp     colonoscopy 04/2014  . Gastritis     EGD 05/2014  . Hiatal hernia   . Schatzki's ring     patent per EGD 04/2014  . Lung nodule     CT 02/2014 needs repeat 1 month     Past Surgical History  Procedure Laterality Date  . Abdominal surgery    . Abdominal hysterectomy  2013    [place]  . Debridement of abdominal wall abscess N/A 02/08/2013    Procedure: DEBRIDEMENT OF ABDOMINAL WALL ABSCESS;  Surgeon: Jamesetta So, MD;  Location: [facility];  Service: General;  Laterality: N/A;  . Billroth ii procedure       [place], first 2000, 2005/2006.  . Colonoscopy      in [place]  . Cholecystectomy    . Colonoscopy with propofol N/A 05/20/2013    Dr.Rourk- inadequate prep, normal appearing rectum, grossly normal colon aside  from pancolonic diverticula, normal terminal ileum bx= unremarkable colonic mucosa. Due for early interval 2016.   [facility] Kitchen Esophagogastroduodenoscopy (egd) with propofol N/A 05/20/2013    Dr.Rourk- s/p prior gastric surgery with normal esophagus, residual gastric mucosa and patent efferent limb  . Esophageal biopsy  05/20/2013    Procedure: BIOPSIES OF ASCENDING AND SIGMOID COLON;  Surgeon: Daneil Dolin, MD;  Location: [facility];  Service: Endoscopy;;  . Wound exploration Right 06/24/2013    Procedure: exploration of traumatic wound right wrist;  Surgeon: Tennis Must, MD;  Location: [facility];  Service: Orthopedics;  Laterality: Right;  . Tendon  repar Right     wrist  . Adrenal turmor removal    . Esophagogastroduodenoscopy (egd) with propofol N/A 02/03/2014    Dr. Gala Romney:  s/p hemigastrectomy with retained gastric contents. Residual gastric mucosa and efferent limb appeared normal otherwise. Query gastroparesis.   . Colonoscopy with propofol N/A 04/26/2014    NUU:VOZDGU ileum/one colon polyp removed/moderate pan-colonic diverticulosis/large internal hemorrhoids  . Esophagogastroduodenoscopy (egd) with propofol N/A 04/26/2014    YQI:[id number] ring/small HH/mild non-erosive gasrtitis/normal anastomosis  . Esophageal biopsy  04/26/2014    Procedure: BIOPSIES;  Surgeon: Danie Binder, MD;  Location: [facility];  Service: Endoscopy;;      Prior to Admission medications   Medication Sig Start Date End Date Taking? Authorizing Provider  fluticasone (FLONASE) 50 MCG/ACT nasal spray Place 1 spray into both nostrils daily.   Yes Historical Provider, MD  lisinopril (PRINIVIL,ZESTRIL) 10 MG tablet Take 10 mg by mouth daily.   Yes Historical Provider, MD  metoCLOPramide (REGLAN) 10 MG tablet Take 1 tablet (10 mg total) by mouth 4 (four) times daily -  before meals and at bedtime. 04/06/14  Yes Lezlie Octave Black, NP  pantoprazole (PROTONIX) 40 MG tablet Take 1 tablet (40 mg total) by mouth 2 (two) times daily before a meal. Patient taking differently: Take 40 mg by mouth daily.  09/04/14  Yes Daleen Bo, MD  sucralfate (CARAFATE) 1 G tablet Take 1 g by mouth 4 (four) times daily.   Yes Historical Provider, MD  HYDROmorphone (DILAUDID) 4 MG tablet Take 1 tablet (4 mg total) by mouth every 6 (six) hours as needed for severe pain. Patient not taking: Reported on 11/26/2014 11/11/14   Fredia Sorrow, MD  ibuprofen (ADVIL,MOTRIN) 200 MG tablet Take 400 mg by mouth every 6 (six) hours as needed for mild pain or moderate pain.     Historical Provider, MD  mupirocin ointment (BACTROBAN) 2 % Place into the nose 2 (two) times daily. Patient not taking: Reported on  12/21/2014 12/13/13   Velta Addison Mikhail, DO  ondansetron (ZOFRAN) 8 MG tablet Take 1 tablet (8 mg total) by mouth every 8 (eight) hours as needed for nausea or vomiting. 09/04/14   Daleen Bo, MD     Allergies  Allergen Reactions  . Codeine Hives  . Morphine And Related Hives    gastritis    Social History:  reports that she has been smoking Cigarettes.  She has a 8.75 pack-year smoking history. She has never used smokeless tobacco. She reports that she drinks about 7.2 oz of alcohol per week. She reports that she does not use illicit drugs.    Family History  Problem Relation Age of Onset  . Brain cancer Son   . Schizophrenia Son   . Cancer Son     brain  . Breast cancer Maternal Aunt   . Bipolar disorder Maternal Aunt   .  Drug abuse Maternal Aunt   . Colon cancer Maternal Grandmother     late 44s, early 53s  . Lung cancer Father   . Cancer Father     mets  . Liver disease Neg Hx   . Drug abuse Mother   . Drug abuse Sister   . Drug abuse Brother   . Bipolar disorder Paternal Grandfather   . Bipolar disorder Cousin        Physical Exam:  GEN:  Pleasant Well Nourished and Well Developed  49 y.o.African American female examined and in no acute distress; cooperative with exam Filed Vitals:   12/21/14 2146 12/21/14 2311 12/22/14 0130  BP: 144/97 103/75 121/80  Pulse: 100 90 110  Temp: 97.6 F (36.4 C)    TempSrc: Oral    Resp: 16 20 20   Height: 5' (1.524 m)    Weight: 59.421 kg (131 lb)    SpO2: 100% 95% 97%   Blood pressure 121/80, pulse 110, temperature 97.6 F (36.4 C), temperature source Oral, resp. rate 20, height 5' (1.524 m), weight 59.421 kg (131 lb), SpO2 97 %. PSYCH: She is alert and oriented x4; does not appear anxious does not appear depressed; affect is normal HEENT: Normocephalic and Atraumatic, Mucous membranes pink; PERRLA; EOM intact; Fundi:  Benign;  No scleral icterus, Nares: Patent, Oropharynx: Clear, Fair Dentition,    Neck:  FROM, No Cervical  Lymphadenopathy nor Thyromegaly or Carotid Bruit; No JVD; Breasts:: Not examined CHEST WALL: No tenderness CHEST: Normal respiration, clear to auscultation bilaterally HEART: Regular rate and rhythm; no murmurs rubs or gallops BACK: No kyphosis or scoliosis; No CVA tenderness ABDOMEN: Positive Bowel Sounds, Multiple surgical Scars, Open Wounds Bilateral Lower quadrant of ABD, Soft Diffusely Tender, No Rebound or Guarding; No Masses, No Organomegaly Rectal Exam: Not done EXTREMITIES: No Cyanosis, Clubbing, or Edema; No Ulcerations. Genitalia: not examined PULSES: 2+ and symmetric SKIN: Normal hydration no rash or ulceration, NumerousTattoos on Both Arms CNS:  Alert and Oriented x 4, No Focal Deficits Vascular: pulses palpable throughout    Labs on Admission:  Basic Metabolic Panel:  Recent Labs Lab 12/21/14 2210  NA 137  K 4.3  CL 101  CO2 24  GLUCOSE 83  BUN 11  CREATININE 0.51  CALCIUM 9.0   Liver Function Tests:  Recent Labs Lab 12/21/14 2210  AST 35  ALT 19  ALKPHOS 77  BILITOT 0.3  PROT 7.6  ALBUMIN 3.8   No results for input(s): LIPASE, AMYLASE in the last 168 hours. No results for input(s): AMMONIA in the last 168 hours. CBC:  Recent Labs Lab 12/21/14 2210  WBC 6.7  NEUTROABS 2.4  HGB 11.3*  HCT 32.6*  MCV 88.1  PLT 253   Cardiac Enzymes: No results for input(s): CKTOTAL, CKMB, CKMBINDEX, TROPONINI in the last 168 hours.  BNP (last 3 results)  Recent Labs  03/14/14 1054  BNP 13.0    ProBNP (last 3 results) No results for input(s): PROBNP in the last 8760 hours.  CBG: No results for input(s): GLUCAP in the last 168 hours.  Radiological Exams on Admission: Ct Abdomen Pelvis W Contrast  12/22/2014   CLINICAL DATA:  Intermittent bright red blood per rectum, onset 5 days ago and worsened today.  EXAM: CT ABDOMEN AND PELVIS WITH CONTRAST  TECHNIQUE: Multidetector CT imaging of the abdomen and pelvis was performed using the standard protocol  following bolus administration of intravenous contrast.  CONTRAST:  37mL OMNIPAQUE IOHEXOL 300 MG/ML SOLN, 180mL OMNIPAQUE IOHEXOL  300 MG/ML SOLN  COMPARISON:  05/22/2014, 06/06/2013  FINDINGS: Lower chest:  No significant abnormality.  Hepatobiliary: Mild hepatic fatty infiltration. Cholecystectomy. Unremarkable bile ducts.  Pancreas: Normal  Spleen: Normal  Adrenals/Urinary Tract: Prior right adrenalectomy. Unchanged 1.9 cm low-attenuation left adrenal nodule, consistent with adenomas. The kidneys, ureters and bladder are unremarkable.  Stomach/Bowel: Prior gastric surgery. Unremarkable small bowel and colon.  Vascular/Lymphatic: The abdominal aorta is normal in caliber. There is mild atherosclerotic calcification. There is no adenopathy in the abdomen or pelvis.  Reproductive: Prior hysterectomy.  No adnexal abnormalities.  Other: Postsurgical changes of the anterior abdominal wall remain unchanged from 06/06/2013. No drainable collection is evident.  Musculoskeletal: No significant musculoskeletal lesion.  IMPRESSION: No acute findings are evident in the abdomen or pelvis. There is mild fatty infiltration of the liver. There is an unchanged benign appearing 1.9 cm left adrenal nodule. There are unchanged postoperative irregularities with soft tissue thickening in the anterior abdominal wall.   Electronically Signed   By: Andreas Newport M.D.   On: 12/22/2014 00:38      Assessment/Plan:   49 y.o. female with  Principal Problem:   1.     Rectal bleeding   Cardiac Monitoring   Check H/H's every 8 hrs   Transfuse PRN   GI consult in AM     Active Problems:   2.    Chronic abdominal pain   Pain Control with IV Dilaudid PRN     3.    Chronic wound infection of abdomen ([place])   Send wound Cultures     4.    Alcohol abuse   CIWA Protocol with Oral Ativan     5.    Hypertension   Monitor BPs   Hold Lisinopril   PRN IV Hydralazine     6.    Thyroid disease   Check TSH Level     7.     History of cocaine abuse   Check UDS    8.    Tobacco abuse   Nicotine Patch, Continue to Decrease until Quit     9.    DVT Prophylaxis   SCDs       Code Status:     FULL CODE       Family Communication:   Daughter at Bedside      Disposition Plan:    Inpatient Status        Time spent:  [place] [facility] Pager [phone]   If [place] Please Contact the Day Rounding Team MD for [facility]  If 7PM-7AM, Please Contact Night-Floor Coverage  [email or web address] Password [email or web address] 12/22/2014, 1:31 AM     ADDENDUM:   Patient was seen and examined on 12/22/2014

## 2014-12-22 NOTE — Consult Note (Signed)
Referring Provider: No ref. provider found Primary Care Physician:  Rosita Fire, MD Primary Gastroenterologist:  Dr. Gala Romney  Date of Admission: 12/22/14 Date of Consultation: 12/22/14  Reason for Consultation:  Rectal bleeding, anemia  HPI:  49 year old female with an extensive medical history including anemia, alcohol abuse, chronic abdominal pain, chronic abdominal wound, blood transfusions, GERD, gastroparesis, nausea/vomiting, rectal bleeding presented to the emergency room in the late evening on 12/21/2014. She complained of 5 day history of rectal bleeding bright red blood which was with every bowel movement and progressed to constant where she needed to wear sanitary pads. Also noted passing clots. When her symptoms started she had to have a bowel movement and passed liquid blood. Also with crampy and sharp mid-abdominal pain at the same time. Her pain persisted since then and was more aggressive then her chronic pain. Bleeding became more progressive and frequent to the point of being constant with leaking. Also admits N/V since the onset of her other symptoms with "numerous" episodes of emesis. Last episde of bleeding was this afternoon, states she showed the nurse, did not note any stool, just blood. Also complains of fatigue, lightheadedness, dizziness since symptoms onset.   Her hemoglobin 3 weeks ago was 14.9. On presentation the emergency room her hemoglobin was 11.3 which declined by the next morning to 10.3. Recheck this afternoon at 10.6. She is previously been admitted for rectal bleeding most recently in February 2016. Colonoscopy at that time found bleeding likely due to large internal hemorrhoids, pancolonic diverticulosis, one colon polyp removed and found to be tubular adenoma. EGD at the same time found nonerosive esophagitis and no obvious source for bleeding. Recommendations included consideration of hemorrhoid banding or surgery.  Past Medical History  Diagnosis Date  .  Lupus ([place])   . 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 Hypertension   . Abscess     soft tissue  . Suicide attempt ([place])   . Blood transfusion without reported diagnosis   . Chronic abdominal pain   . Chronic wound infection of abdomen ([place])   . Adrenal mass ([place])   . Anxiety   . Depression   . GERD (gastroesophageal reflux disease)   . Sickle cell trait ([place])   . Gastroparesis Nov 2015  . Nausea and vomiting     chronic, recurrent  . Alcohol abuse   . History of Billroth II operation   . Lung nodule < 6cm on CT 04/25/2014  . Hemorrhoid     internal large  . Diverticulosis     colonoscopy 04/2014 moderat pan colonic  . Colon polyp     colonoscopy 04/2014  . Gastritis     EGD 05/2014  . Hiatal hernia   . Schatzki's ring     patent per EGD 04/2014  . Lung nodule     CT 02/2014 needs repeat 1 month    Past Surgical History  Procedure Laterality Date  . Abdominal surgery    . Abdominal hysterectomy  2013    [place]  . Debridement of abdominal wall abscess N/A 02/08/2013    Procedure: DEBRIDEMENT OF ABDOMINAL WALL ABSCESS;  Surgeon: Jamesetta So, MD;  Location: [facility];  Service: General;  Laterality: N/A;  . Billroth ii procedure       [place], first 2000, 2005/2006.  . Colonoscopy      in [place]  . Cholecystectomy    . Colonoscopy with propofol N/A 05/20/2013    Dr.Rourk- inadequate prep, normal appearing rectum, grossly  normal colon aside from pancolonic diverticula, normal terminal ileum bx= unremarkable colonic mucosa. Due for early interval 2016.   Marland Kitchen Esophagogastroduodenoscopy (egd) with propofol N/A 05/20/2013    Dr.Rourk- s/p prior gastric surgery with normal esophagus, residual gastric mucosa and patent efferent limb  . Esophageal biopsy  05/20/2013    Procedure: BIOPSIES OF ASCENDING AND SIGMOID COLON;  Surgeon: Daneil Dolin, MD;  Location: [facility];  Service: Endoscopy;;  . Wound exploration Right 06/24/2013    Procedure: exploration of traumatic wound  right wrist;  Surgeon: Tennis Must, MD;  Location: [facility];  Service: Orthopedics;  Laterality: Right;  . Tendon repar Right     wrist  . Adrenal turmor removal    . Esophagogastroduodenoscopy (egd) with propofol N/A 02/03/2014    Dr. Gala Romney:  s/p hemigastrectomy with retained gastric contents. Residual gastric mucosa and efferent limb appeared normal otherwise. Query gastroparesis.   . Colonoscopy with propofol N/A 04/26/2014    ZOX:WRUEAV ileum/one colon polyp removed/moderate pan-colonic diverticulosis/large internal hemorrhoids  . Esophagogastroduodenoscopy (egd) with propofol N/A 04/26/2014    WUJ:[id number] ring/small HH/mild non-erosive gasrtitis/normal anastomosis  . Esophageal biopsy  04/26/2014    Procedure: BIOPSIES;  Surgeon: Danie Binder, MD;  Location: [facility];  Service: Endoscopy;;    Prior to Admission medications   Medication Sig Start Date End Date Taking? Authorizing Provider  fluticasone (FLONASE) 50 MCG/ACT nasal spray Place 1 spray into both nostrils daily.   Yes Historical Provider, MD  lisinopril (PRINIVIL,ZESTRIL) 10 MG tablet Take 10 mg by mouth daily.   Yes Historical Provider, MD  metoCLOPramide (REGLAN) 10 MG tablet Take 1 tablet (10 mg total) by mouth 4 (four) times daily -  before meals and at bedtime. 04/06/14  Yes Lezlie Octave Black, NP  pantoprazole (PROTONIX) 40 MG tablet Take 1 tablet (40 mg total) by mouth 2 (two) times daily before a meal. Patient taking differently: Take 40 mg by mouth daily.  09/04/14  Yes Daleen Bo, MD  sucralfate (CARAFATE) 1 G tablet Take 1 g by mouth 4 (four) times daily.   Yes Historical Provider, MD  HYDROmorphone (DILAUDID) 4 MG tablet Take 1 tablet (4 mg total) by mouth every 6 (six) hours as needed for severe pain. Patient not taking: Reported on 11/26/2014 11/11/14   Fredia Sorrow, MD  ibuprofen (ADVIL,MOTRIN) 200 MG tablet Take 400 mg by mouth every 6 (six) hours as needed for mild pain or moderate pain.     Historical Provider, MD   mupirocin ointment (BACTROBAN) 2 % Place into the nose 2 (two) times daily. Patient not taking: Reported on 12/21/2014 12/13/13   Velta Addison Mikhail, DO  ondansetron (ZOFRAN) 8 MG tablet Take 1 tablet (8 mg total) by mouth every 8 (eight) hours as needed for nausea or vomiting. 09/04/14   Daleen Bo, MD    Current Facility-Administered Medications  Medication Dose Route Frequency Provider Last Rate Last Dose  . 0.9 %  sodium chloride infusion   Intravenous STAT Delora Fuel, MD   Stopped at 12/22/14 [phone]  . 0.9 %  sodium chloride infusion   Intravenous Continuous Theressa Millard, MD 100 mL/hr at 12/22/14 1359    . acetaminophen (TYLENOL) tablet 650 mg  650 mg Oral Q6H PRN Theressa Millard, MD       Or  . acetaminophen (TYLENOL) suppository 650 mg  650 mg Rectal Q6H PRN Theressa Millard, MD      . alum & mag hydroxide-simeth (MAALOX/MYLANTA) 200-200-20 MG/5ML suspension 30  mL  30 mL Oral Q6H PRN Theressa Millard, MD      . folic acid (FOLVITE) tablet 1 mg  1 mg Oral Daily Theressa Millard, MD   1 mg at 12/22/14 1326  . HYDROmorphone (DILAUDID) injection 0.5-1 mg  0.5-1 mg Intravenous Q3H PRN Theressa Millard, MD   1 mg at 12/22/14 1323  . HYDROmorphone (DILAUDID) injection 1 mg  1 mg Intravenous N8G PRN Delora Fuel, MD      . Derrill Memo ON 12/23/2014] Influenza vac split quadrivalent PF (FLUARIX) injection 0.5 mL  0.5 mL Intramuscular Tomorrow-1000 Harvette Evonnie Dawes, MD      . iohexol (OMNIPAQUE) 300 MG/ML solution 100 mL  100 mL Intravenous Once PRN Medication Radiologist, MD   100 mL at 12/22/14 0006  . LORazepam (ATIVAN) tablet 1 mg  1 mg Oral Q6H PRN Theressa Millard, MD   1 mg at 12/22/14 0302   Or  . LORazepam (ATIVAN) injection 1 mg  1 mg Intravenous Q6H PRN Theressa Millard, MD   1 mg at 12/22/14 0805  . LORazepam (ATIVAN) tablet 0-4 mg  0-4 mg Oral Q6H Theressa Millard, MD   1 mg at 12/22/14 0745   Followed by  . [START ON 12/24/2014] LORazepam (ATIVAN) tablet 0-4 mg  0-4  mg Oral Q12H Harvette Evonnie Dawes, MD      . multivitamin with minerals tablet 1 tablet  1 tablet Oral Daily Harvette Evonnie Dawes, MD      . nicotine (NICODERM CQ - dosed in mg/24 hr) patch 7 mg  7 mg Transdermal Daily Theressa Millard, MD   7 mg at 12/22/14 0329  . ondansetron (ZOFRAN) injection 4 mg  4 mg Intravenous N5A PRN Delora Fuel, MD      . ondansetron [facility]) tablet 4 mg  4 mg Oral Q6H PRN Theressa Millard, MD       Or  . ondansetron (ZOFRAN) injection 4 mg  4 mg Intravenous Q6H PRN Theressa Millard, MD   4 mg at 12/22/14 1323  . pantoprazole (PROTONIX) injection 40 mg  40 mg Intravenous Q12H Theressa Millard, MD   40 mg at 12/22/14 0209  . thiamine (VITAMIN B-1) tablet 100 mg  100 mg Oral Daily Theressa Millard, MD       Or  . thiamine (B-1) injection 100 mg  100 mg Intravenous Daily Theressa Millard, MD   100 mg at 12/22/14 1320    Allergies as of 12/21/2014 - Review Complete 12/21/2014  Allergen Reaction Noted  . Codeine Hives 02/06/2013  . Morphine and related Hives 02/06/2013    Family History  Problem Relation Age of Onset  . Brain cancer Son   . Schizophrenia Son   . Cancer Son     brain  . Breast cancer Maternal Aunt   . Bipolar disorder Maternal Aunt   . Drug abuse Maternal Aunt   . Colon cancer Maternal Grandmother     late 44s, early 53s  . Lung cancer Father   . Cancer Father     mets  . Liver disease Neg Hx   . Drug abuse Mother   . Drug abuse Sister   . Drug abuse Brother   . Bipolar disorder Paternal Grandfather   . Bipolar disorder Cousin     Social History   Social History  . Marital Status: Legally Separated    Spouse Name: N/A  . Number of Children: N/A  .  Years of Education: N/A   Occupational History  . Not on file.   Social History Main Topics  . Smoking status: Current Every Day Smoker -- 0.25 packs/day for 35 years    Types: Cigarettes  . Smokeless tobacco: Never Used  . Alcohol Use: 7.2 oz/week    12 Cans of beer, 0  Standard drinks or equivalent per week     Comment: ETOH abuse. as of 04/25/14, states drinking 1-2 beers per day  . Drug Use: No     Comment: quit 09/2012  . Sexual Activity: Yes    Birth Control/ Protection: Surgical   Other Topics Concern  . Not on file   Social History Narrative    Review of Systems: Gen: Denies fever, chills, loss of appetite, change in weight or weight loss CV: Denies chest pain, heart palpitations, syncope, edema  Resp: Denies shortness of breath with rest, cough, wheezing GI: Denies dysphagia or odynophagia. Denies vomiting blood, jaundice, and fecal incontinence.  GU : Denies urinary burning, urinary frequency, urinary incontinence.  MS: Denies joint pain,swelling, cramping Derm: Denies rash, itching, dry skin Psych: Denies depression, anxiety,confusion, or memory loss Heme: Denies bruising, bleeding, and enlarged lymph nodes.  Physical Exam: Vital signs in last 24 hours: Temp:  [97 F (36.1 C)-98.6 F (37 C)] 97.9 F (36.6 C) (10/06 1347) Pulse Rate:  [88-110] 88 (10/06 1347) Resp:  [16-20] 20 (10/06 1347) BP: (91-144)/(66-97) 108/86 mmHg (10/06 1347) SpO2:  [95 %-100 %] 95 % (10/06 1347) Weight:  [131 lb (59.421 kg)-131 lb 3.2 oz (59.512 kg)] 131 lb 3.2 oz (59.512 kg) (10/06 0259) Last BM Date: 12/21/14 General:   Alert,  Well-developed, well-nourished, pleasant and cooperative in NAD Head:  Normocephalic and atraumatic. Eyes:  Sclera clear, no icterus.   Conjunctiva pink. Ears:  Normal auditory acuity. Nose:  No deformity, discharge,  or lesions. Mouth:  No deformity or lesions, dentition normal. Neck:  Supple; no masses or thyromegaly. Lungs:  Clear throughout to auscultation.   No wheezes, crackles, or rhonchi. No acute distress. Heart:  Regular rate and rhythm; no murmurs, clicks, rubs,  or gallops. Abdomen:  Soft, nontender and nondistended. No masses, hepatosplenomegaly or hernias noted. Normal bowel sounds, without guarding, and without  rebound.   Rectal:  Deferred until time of colonoscopy.   Msk:  Symmetrical without gross deformities. Normal posture. Pulses:  Normal pulses noted. Extremities:  Without clubbing or edema. Neurologic:  Alert and  oriented x4;  grossly normal neurologically. Skin:  Intact without significant lesions or rashes. Cervical Nodes:  No significant cervical adenopathy. Psych:  Alert and cooperative. Normal mood and affect.  Intake/Output from previous day:   Intake/Output this shift:    Lab Results:  Recent Labs  12/21/14 2210 12/22/14 0140 12/22/14 0701 12/22/14 0941  WBC 6.7  --  4.2  --   HGB 11.3* 11.3* 10.3* 10.6*  HCT 32.6* 32.3* 29.8* 30.4*  PLT 253  --  211  --    BMET  Recent Labs  12/21/14 2210 12/22/14 0701  NA 137 136  K 4.3 4.0  CL 101 105  CO2 24 21*  GLUCOSE 83 54*  BUN 11 9  CREATININE 0.51 0.45  CALCIUM 9.0 8.0*   LFT  Recent Labs  12/21/14 2210  PROT 7.6  ALBUMIN 3.8  AST 35  ALT 19  ALKPHOS 77  BILITOT 0.3   PT/INR No results for input(s): LABPROT, INR in the last 72 hours. Hepatitis Panel No results for input(s):  HEPBSAG, HCVAB, HEPAIGM, HEPBIGM in the last 72 hours. C-Diff No results for input(s): CDIFFTOX in the last 72 hours.  Studies/Results: Ct Abdomen Pelvis W Contrast  12/22/2014   CLINICAL DATA:  Intermittent bright red blood per rectum, onset 5 days ago and worsened today.  EXAM: CT ABDOMEN AND PELVIS WITH CONTRAST  TECHNIQUE: Multidetector CT imaging of the abdomen and pelvis was performed using the standard protocol following bolus administration of intravenous contrast.  CONTRAST:  48mL OMNIPAQUE IOHEXOL 300 MG/ML SOLN, 158mL OMNIPAQUE IOHEXOL 300 MG/ML SOLN  COMPARISON:  05/22/2014, 06/06/2013  FINDINGS: Lower chest:  No significant abnormality.  Hepatobiliary: Mild hepatic fatty infiltration. Cholecystectomy. Unremarkable bile ducts.  Pancreas: Normal  Spleen: Normal  Adrenals/Urinary Tract: Prior right adrenalectomy.  Unchanged 1.9 cm low-attenuation left adrenal nodule, consistent with adenomas. The kidneys, ureters and bladder are unremarkable.  Stomach/Bowel: Prior gastric surgery. Unremarkable small bowel and colon.  Vascular/Lymphatic: The abdominal aorta is normal in caliber. There is mild atherosclerotic calcification. There is no adenopathy in the abdomen or pelvis.  Reproductive: Prior hysterectomy.  No adnexal abnormalities.  Other: Postsurgical changes of the anterior abdominal wall remain unchanged from 06/06/2013. No drainable collection is evident.  Musculoskeletal: No significant musculoskeletal lesion.  IMPRESSION: No acute findings are evident in the abdomen or pelvis. There is mild fatty infiltration of the liver. There is an unchanged benign appearing 1.9 cm left adrenal nodule. There are unchanged postoperative irregularities with soft tissue thickening in the anterior abdominal wall.   Electronically Signed   By: Andreas Newport M.D.   On: 12/22/2014 00:38    Impression: 49 year old female with extensive history admitted for persistent bright red rectal bleeding over 5 days associated with crampy/sharp abdominal pain worse then her chronic pain. She is also symptomatic with dizziness, lightheadedness. Has been intermittently mildly tachycardic but blood pressure has been stable. Her hgb 3 weeks ago was 14.9 and had dropped to 11.3 on ED presentation with another 1 gm drop to 10.3 this morning. Her last colonoscopy/EGD during similar admission for rectal bleed 8 months ago found likely etiology large internal hemorrhoids, EGD with mild non-erosive gastritis; recommended hemorrhoid banding versus surgical hemorrhoidectomy. Discussed with hopsitalist who states patient told her she noted some "coffee-looking stuff" in her emesis, but did not admit this to me when I saw her. CT abdomen/pelvis notes mild aortic atherosclerotic calcification, mild fatty liver, s/p cholecystectomy, unremarkable small bowel and  colon.  Given last colonoscopy results, she likely has diverticular bleed versus large hemorrhoid bleed. She has had a significant drop in hgb over the past 3 weeks, and a 1 gm drop from admission, but this could be in part due to rehydration. She is symptomatic with her anemia and notes continued bleeding. She has staff witnessed continued bleeding with blood clots even this afternoon. Will need to proceed with colonoscopy on propofol (chronic ETOH use).  Plan: 1. Continue to monitor H/H closely 2. Transfuse as needed 3. Continue supportive measures 4. Continue PPI 5. Continue Zofran as needed for nausea 6. Clear liquids tonight, NPO after midnight 7. Colonoscopy with possible EGD tomorrow in the OR on propofol due to chronic alcohol use. 8. Split bowel prep: GoLytely 2,000 mL this evening and 2,000 mL early tomorrow morning 9. Tap water enema x 2 on call for procedure   Proceed with TCS +/- EGD with Dr. Gala Romney in near future: the risks, benefits, and alternatives have been discussed with the patient in detail. The patient states understanding and desires  to proceed.    Walden Field, AGNP-C Adult & Gerontological Nurse Practitioner [facility]     LOS: 0 days     12/22/2014, 2:38 PM

## 2014-12-22 NOTE — Care Management Note (Signed)
Case Management Note  Patient Details  Name: Angel Price MRN: [id number] Date of Birth: 1965/08/07  Subjective/Objective:                  Pt admitted from home with rectal bleeding. Pt lives with her family and will return home at discharge. Pt has a walker that she uses prn. Pt stated she is fairly independent with ADl's.  Action/Plan: Will continue to follow for discharge planning needs.  Expected Discharge Date:  12/25/14               Expected Discharge Plan:  Home/Self Care  In-House Referral:  NA  Discharge planning Services  CM Consult  Post Acute Care Choice:  NA Choice offered to:  NA  DME Arranged:    DME Agency:     HH Arranged:    HH Agency:     Status of Service:  Completed, signed off  Medicare Important Message Given:    Date Medicare IM Given:    Medicare IM give by:    Date Additional Medicare IM Given:    Additional Medicare Important Message give by:     If discussed at [place] of Stay Meetings, dates discussed:    Additional Comments:  Joylene Draft, RN 12/22/2014, 1:40 PM

## 2014-12-22 NOTE — Progress Notes (Signed)
Patient seen and examined. Admitted earlier today for BRBPR. On exam today, patient also attests to some coffee-ground emesis. Hemodynamically stable, Hb has remained stable at around 10. No need for transfusion at present. Will request GI consultation as she will likely need upper and lower endoscopies this admission (at discretion of GI). She does have a h/o ETOH abuse and drinks every day. Will place on CIWA protocol.  Dr. Legrand Rams will assume care in am.  Domingo Mend, MD [facility] Pager: [phone]

## 2014-12-23 ENCOUNTER — Encounter (HOSPITAL_COMMUNITY): Payer: Self-pay | Admitting: *Deleted

## 2014-12-23 ENCOUNTER — Encounter (HOSPITAL_COMMUNITY)
Admission: EM | Disposition: A | Discharge status: Home / Self Care | Payer: Self-pay | Source: Home / Self Care | Attending: Internal Medicine

## 2014-12-23 ENCOUNTER — Inpatient Hospital Stay (HOSPITAL_COMMUNITY): Payer: Medicare Other | Admitting: Anesthesiology

## 2014-12-23 DIAGNOSIS — K922 Gastrointestinal hemorrhage, unspecified: Secondary | ICD-10-CM | POA: Diagnosis not present

## 2014-12-23 DIAGNOSIS — K5731 Diverticulosis of large intestine without perforation or abscess with bleeding: Secondary | ICD-10-CM | POA: Insufficient documentation

## 2014-12-23 HISTORY — PX: COLONOSCOPY WITH PROPOFOL: SHX5780

## 2014-12-23 HISTORY — PX: ESOPHAGOGASTRODUODENOSCOPY (EGD) WITH PROPOFOL: SHX5813

## 2014-12-23 LAB — CBC
HEMATOCRIT: 31.8 % — AB (ref 36.0–46.0)
HEMOGLOBIN: 10.9 g/dL — AB (ref 12.0–15.0)
MCH: 30.3 pg (ref 26.0–34.0)
MCHC: 34.3 g/dL (ref 30.0–36.0)
MCV: 88.3 fL (ref 78.0–100.0)
Platelets: 207 10*3/uL (ref 150–400)
RBC: 3.6 MIL/uL — ABNORMAL LOW (ref 3.87–5.11)
RDW: 15.9 % — ABNORMAL HIGH (ref 11.5–15.5)
WBC: 3.9 10*3/uL — ABNORMAL LOW (ref 4.0–10.5)

## 2014-12-23 LAB — RAPID URINE DRUG SCREEN, HOSP PERFORMED
Amphetamines: NOT DETECTED
BARBITURATES: NOT DETECTED
Benzodiazepines: POSITIVE — AB
COCAINE: NOT DETECTED
OPIATES: POSITIVE — AB
TETRAHYDROCANNABINOL: NOT DETECTED

## 2014-12-23 SURGERY — COLONOSCOPY WITH PROPOFOL
Anesthesia: Monitor Anesthesia Care

## 2014-12-23 MED ORDER — MIDAZOLAM HCL 5 MG/5ML IJ SOLN
INTRAMUSCULAR | Status: DC | PRN
  Administered 2014-12-23: 2 mg via INTRAVENOUS

## 2014-12-23 MED ORDER — ONDANSETRON HCL 4 MG/2ML IJ SOLN
4.0000 mg | Freq: Once | INTRAMUSCULAR | Status: AC
Start: 2014-12-23 — End: 2014-12-23
  Administered 2014-12-23: 4 mg via INTRAVENOUS

## 2014-12-23 MED ORDER — FENTANYL CITRATE (PF) 100 MCG/2ML IJ SOLN
INTRAMUSCULAR | Status: AC
  Filled 2014-12-23: qty 4

## 2014-12-23 MED ORDER — MIDAZOLAM HCL 2 MG/2ML IJ SOLN
1.0000 mg | INTRAMUSCULAR | Status: DC | PRN
  Administered 2014-12-23 (×2): 2 mg via INTRAVENOUS

## 2014-12-23 MED ORDER — MIDAZOLAM HCL 2 MG/2ML IJ SOLN
INTRAMUSCULAR | Status: AC
  Filled 2014-12-23: qty 4

## 2014-12-23 MED ORDER — FENTANYL CITRATE (PF) 100 MCG/2ML IJ SOLN: 25.0000 ug | INTRAMUSCULAR | Status: DC | PRN

## 2014-12-23 MED ORDER — LIDOCAINE HCL (PF) 1 % IJ SOLN
INTRAMUSCULAR | Status: AC
  Filled 2014-12-23: qty 5

## 2014-12-23 MED ORDER — PANTOPRAZOLE SODIUM 40 MG IV SOLR
40.0000 mg | INTRAVENOUS | Status: DC
  Administered 2014-12-24 – 2014-12-25 (×2): 40 mg via INTRAVENOUS
  Filled 2014-12-23 (×2): qty 40

## 2014-12-23 MED ORDER — MIDAZOLAM HCL 2 MG/2ML IJ SOLN
INTRAMUSCULAR | Status: AC
  Filled 2014-12-23: qty 2

## 2014-12-23 MED ORDER — STERILE WATER FOR IRRIGATION IR SOLN
Status: DC | PRN
  Administered 2014-12-23: 1000 mL

## 2014-12-23 MED ORDER — LACTATED RINGERS IV SOLN
INTRAVENOUS | Status: DC
  Administered 2014-12-23: 11:00:00 via INTRAVENOUS

## 2014-12-23 MED ORDER — PROPOFOL 10 MG/ML IV BOLUS
INTRAVENOUS | Status: AC
  Filled 2014-12-23: qty 20

## 2014-12-23 MED ORDER — PROPOFOL 500 MG/50ML IV EMUL
INTRAVENOUS | Status: DC | PRN
  Administered 2014-12-23: 150 ug/kg/min via INTRAVENOUS
  Administered 2014-12-23: 14:00:00 via INTRAVENOUS

## 2014-12-23 MED ORDER — FENTANYL CITRATE (PF) 100 MCG/2ML IJ SOLN
INTRAMUSCULAR | Status: AC
  Filled 2014-12-23: qty 2

## 2014-12-23 MED ORDER — FENTANYL CITRATE (PF) 100 MCG/2ML IJ SOLN
25.0000 ug | INTRAMUSCULAR | Status: AC
  Administered 2014-12-23 (×2): 25 ug via INTRAVENOUS

## 2014-12-23 MED ORDER — LIDOCAINE VISCOUS 2 % MT SOLN
OROMUCOSAL | Status: DC | PRN
  Administered 2014-12-23: 6 mL via OROMUCOSAL

## 2014-12-23 MED ORDER — ONDANSETRON HCL 4 MG/2ML IJ SOLN: 4.0000 mg | Freq: Once | INTRAMUSCULAR | Status: DC | PRN

## 2014-12-23 MED ORDER — ONDANSETRON HCL 4 MG/2ML IJ SOLN
INTRAMUSCULAR | Status: AC
  Filled 2014-12-23: qty 2

## 2014-12-23 MED ORDER — SODIUM CHLORIDE 0.9 % IV SOLN
INTRAVENOUS | Status: DC
Start: 2014-12-23 — End: 2014-12-23

## 2014-12-23 MED ORDER — FENTANYL CITRATE (PF) 100 MCG/2ML IJ SOLN
INTRAMUSCULAR | Status: DC | PRN
  Administered 2014-12-23: 50 ug via INTRAVENOUS

## 2014-12-23 MED ORDER — DIPHENHYDRAMINE HCL 50 MG/ML IJ SOLN
12.5000 mg | Freq: Once | INTRAMUSCULAR | Status: AC
  Administered 2014-12-23: 12.5 mg via INTRAVENOUS
  Filled 2014-12-23: qty 1

## 2014-12-23 MED ORDER — LIDOCAINE VISCOUS 2 % MT SOLN
OROMUCOSAL | Status: AC
  Filled 2014-12-23: qty 15

## 2014-12-23 SURGICAL SUPPLY — 28 items
BLOCK BITE 60FR ADLT L/F BLUE (MISCELLANEOUS) ×1 IMPLANT
CLIP HMST11XOPN 235X2.8X (MISCELLANEOUS) IMPLANT
CLIP RESOLUTION 360 11X235 (MISCELLANEOUS) ×4
DEVICE CLIP HEMOSTAT 235CM (CLIP) IMPLANT
ELECT REM PT RETURN 9FT ADLT (ELECTROSURGICAL)
ELECTRODE REM PT RTRN 9FT ADLT (ELECTROSURGICAL) IMPLANT
FCP BXJMBJMB 240X2.8X (CUTTING FORCEPS)
FLOOR PAD 36X40 (MISCELLANEOUS)
FORCEPS BIOP RAD 4 LRG CAP 4 (CUTTING FORCEPS) IMPLANT
FORCEPS BIOP RJ4 240 W/NDL (CUTTING FORCEPS)
FORCEPS BXJMBJMB 240X2.8X (CUTTING FORCEPS) IMPLANT
FORMALIN 10 PREFIL 20ML (MISCELLANEOUS) IMPLANT
INJECTOR/SNARE I SNARE (MISCELLANEOUS) IMPLANT
KIT ENDO PROCEDURE PEN (KITS) ×2 IMPLANT
MANIFOLD NEPTUNE II (INSTRUMENTS) ×1 IMPLANT
NDL SCLEROTHERAPY 25GX240 (NEEDLE) IMPLANT
NEEDLE SCLEROTHERAPY 25GX240 (NEEDLE) IMPLANT
PAD FLOOR 36X40 (MISCELLANEOUS) IMPLANT
PROBE APC STR FIRE (PROBE) IMPLANT
PROBE INJECTION GOLD (MISCELLANEOUS)
PROBE INJECTION GOLD 7FR (MISCELLANEOUS) IMPLANT
SNARE ROTATE MED OVAL 20MM (MISCELLANEOUS) IMPLANT
SNARE SHORT THROW 13M SML OVAL (MISCELLANEOUS) ×1 IMPLANT
SYR 50ML LL SCALE MARK (SYRINGE) ×1 IMPLANT
SYR INFLATION 60ML (SYRINGE) ×1 IMPLANT
TRAP SPECIMEN MUCOUS 40CC (MISCELLANEOUS) IMPLANT
TUBING IRRIGATION ENDOGATOR (MISCELLANEOUS) ×1 IMPLANT
WATER STERILE IRR 1000ML POUR (IV SOLUTION) ×1 IMPLANT

## 2014-12-23 NOTE — Transfer of Care (Signed)
Immediate Anesthesia Transfer of Care Note  Patient: Angel Price  Procedure(s) Performed: Procedure(s) with comments: COLONOSCOPY WITH PROPOFOL (N/A) - cecum time in 1408   time out  1415    total time7 minutes  ESOPHAGOGASTRODUODENOSCOPY (EGD) WITH PROPOFOL (N/A) - procedure 1  Patient Location: PACU  Anesthesia Type:MAC  Level of Consciousness: awake, alert  and oriented  Airway & Oxygen Therapy: Patient Spontanous Breathing  Post-op Assessment: Report given to RN  Post vital signs: Reviewed and stable  Last Vitals:  Filed Vitals:   12/23/14 1325  BP: 143/101  Pulse:   Temp:   Resp: 15    Complications: No apparent anesthesia complications

## 2014-12-23 NOTE — Progress Notes (Signed)
Patient refused second tap water enema stating she was sufficiently cleaned out from the first enema. Patients initial output was mainly clear with evidence of blood and minimal to no stool.

## 2014-12-23 NOTE — Progress Notes (Signed)
Removed gauze from abdominal wound.  Moderate amount of green drainage noted.  Placed 1 4 by 4 on each wound and covered with cloth tape. Patient tolerated prodedure well.

## 2014-12-23 NOTE — Anesthesia Preprocedure Evaluation (Signed)
Anesthesia Evaluation  Patient identified by MRN, date of birth, ID band Patient awake    Reviewed: Allergy & Precautions, H&P , NPO status , Patient's Chart, lab work & pertinent test results  History of Anesthesia Complications Negative for: history of anesthetic complications  Airway Mallampati: I  TM Distance: >3 FB     Dental  (+) Poor Dentition, Chipped, Missing, Dental Advisory Given,    Pulmonary Current Smoker,    breath sounds clear to auscultation       Cardiovascular hypertension, Pt. on medications  Rhythm:Regular Rate:Normal     Neuro/Psych  Headaches, PSYCHIATRIC DISORDERS Anxiety Depression Bipolar Disorder    GI/Hepatic neg GERD  ,  Endo/Other  Diabetes: s/p  I131 Rx.Hypothyroidism   Renal/GU      Musculoskeletal   Abdominal   Peds  Hematology  (+) anemia ,   Anesthesia Other Findings   Reproductive/Obstetrics                             Anesthesia Physical Anesthesia Plan  ASA: III  Anesthesia Plan: MAC   Post-op Pain Management:    Induction: Intravenous  Airway Management Planned: Simple Face Mask  Additional Equipment:   Intra-op Plan:   Post-operative Plan:   Informed Consent: I have reviewed the patients History and Physical, chart, labs and discussed the procedure including the risks, benefits and alternatives for the proposed anesthesia with the patient or authorized representative who has indicated his/her understanding and acceptance.     Plan Discussed with:   Anesthesia Plan Comments:         Anesthesia Quick Evaluation

## 2014-12-23 NOTE — Op Note (Signed)
[facility] [address] [place], [address]   COLONOSCOPY PROCEDURE REPORT  PATIENT: Angel Price, Angel Price  MR#: [id number] BIRTHDATE: 11/07/1965 , 89  yrs. old GENDER: female ENDOSCOPIST: Daneil Dolin, Physician REFERRED MG:[id number] Fanta, M.D. PROCEDURE DATE:  10-Jan-2015 PROCEDURE:   Colonoscopy, diagnostic INDICATIONS:hematochezia; decline in hemoglobin. MEDICATIONS: Deep sedation per Dr.  [facility] ASA CLASS:       Class III  CONSENT: The risks, benefits, alternatives and imponderables including but not limited to bleeding, perforation as well as the possibility of a missed lesion have been reviewed.  The potential for biopsy, lesion removal, etc. have also been discussed. Questions have been answered.  All parties agreeable.  Please see the history and physical in the medical record for more information.  DESCRIPTION OF PROCEDURE:   After the risks benefits and alternatives of the procedure were thoroughly explained, informed consent was obtained.  The digital rectal exam revealed no abnormalities of the rectum.   The     endoscope was introduced through the anus and advanced to the terminal ileum which was intubated for a short distance. No adverse events experienced. The quality of the prep was adequate  The instrument was then slowly withdrawn as the colon was fully examined. Estimated blood loss is zero unless otherwise noted in this procedure report.      COLON FINDINGS: Minimal internal hemorrhoids; otherwise normal appearing rectal mucosa.  Scattered pancolonic diverticula; the remainder of the colonic mucosa appeared normal.  The distal 10 cm of the terminal ileum also appeared normal.  Please note there was no blood or clot anywhere in the lower GI tract or terminal ileum; no suspicious lesions found.  Retroflexion was performed. .  Withdrawal time=8 minutes 0 seconds.  The scope was withdrawn and the procedure  completed. COMPLICATIONS:  ENDOSCOPIC IMPRESSION: Minimal internal hemorrhoids. Pancolonic diverticulosis. No bleeding lesion or suspect lesion found.  RECOMMENDATIONS: Advance diet. Outpatient capsule study of the small intestine. See EGD report. If patient experiences acute recurrent bleeding in the interim, would recommend a stat tagged RBC study.  eSigned:  Daneil Dolin, Physician 01-10-2015 2:44 PM   cc:  CPT CODES: ICD CODES:  The ICD and CPT codes recommended by this software are interpretations from the data that the clinical staff has captured with the software.  The verification of the translation of this report to the ICD and CPT codes and modifiers is the sole responsibility of the health care institution and practicing physician where this report was generated.  [employer] will not be held responsible for the validity of the ICD and CPT codes included on this report.  AMA assumes no liability for data contained or not contained herein. CPT is a Designer, television/film set of the [employer].  PATIENT NAME:  Angel Price, Angel Price MR#: [id number]

## 2014-12-23 NOTE — Progress Notes (Signed)
Subjective: Patient was admitted due to rectal bleeding. Her hemoglobin is around 10. She is blanned for colonoscopy. No nausea or vomiting.  Objective: Vital signs in last 24 hours: Temp:  [97.9 F (36.6 C)-98.7 F (37.1 C)] 98.7 F (37.1 C) (10/07 0443) Pulse Rate:  [76-88] 76 (10/07 0443) Resp:  [14-20] 16 (10/07 0443) BP: (105-125)/(79-95) 105/79 mmHg (10/07 0443) SpO2:  [94 %-97 %] 94 % (10/07 0443) Weight change:  Last BM Date: 12/22/14  Intake/Output from previous day:    PHYSICAL EXAM General appearance: alert and no distress Resp: clear to auscultation bilaterally Cardio: S1, S2 normal GI: soft, non-tender; bowel sounds normal; no masses,  no organomegaly Extremities: extremities normal, atraumatic, no cyanosis or edema  Lab Results:  Results for orders placed or performed during the hospital encounter of 12/21/14 (from the past 48 hour(s))  CBC with Differential     Status: Abnormal   Collection Time: 12/21/14 10:10 PM  Result Value Ref Range   WBC 6.7 4.0 - 10.5 K/uL   RBC 3.70 (L) 3.87 - 5.11 MIL/uL   Hemoglobin 11.3 (L) 12.0 - 15.0 g/dL   HCT 32.6 (L) 36.0 - 46.0 %   MCV 88.1 78.0 - 100.0 fL   MCH 30.5 26.0 - 34.0 pg   MCHC 34.7 30.0 - 36.0 g/dL   RDW 15.9 (H) 11.5 - 15.5 %   Platelets 253 150 - 400 K/uL   Neutrophils Relative % 36 %   Neutro Abs 2.4 1.7 - 7.7 K/uL   Lymphocytes Relative 53 %   Lymphs Abs 3.6 0.7 - 4.0 K/uL   Monocytes Relative 10 %   Monocytes Absolute 0.6 0.1 - 1.0 K/uL   Eosinophils Relative 1 %   Eosinophils Absolute 0.0 0.0 - 0.7 K/uL   Basophils Relative 0 %   Basophils Absolute 0.0 0.0 - 0.1 K/uL  Comprehensive metabolic panel     Status: None   Collection Time: 12/21/14 10:10 PM  Result Value Ref Range   Sodium 137 135 - 145 mmol/L   Potassium 4.3 3.5 - 5.1 mmol/L   Chloride 101 101 - 111 mmol/L   CO2 24 22 - 32 mmol/L   Glucose, Bld 83 65 - 99 mg/dL   BUN 11 6 - 20 mg/dL   Creatinine, Ser 0.51 0.44 - 1.00 mg/dL    Calcium 9.0 8.9 - 10.3 mg/dL   Total Protein 7.6 6.5 - 8.1 g/dL   Albumin 3.8 3.5 - 5.0 g/dL   AST 35 15 - 41 U/L   ALT 19 14 - 54 U/L   Alkaline Phosphatase 77 38 - 126 U/L   Total Bilirubin 0.3 0.3 - 1.2 mg/dL   GFR calc non Af Amer >60 >60 mL/min   GFR calc Af Amer >60 >60 mL/min    Comment: (NOTE) The eGFR has been calculated using the CKD EPI equation. This calculation has not been validated in all clinical situations. eGFR's persistently <60 mL/min signify possible Chronic Kidney Disease.    Anion gap 12 5 - 15  Type and screen     Status: None   Collection Time: 12/21/14 10:10 PM  Result Value Ref Range   ABO/RH(D) O POS    Antibody Screen NEG    Sample Expiration 12/24/2014   TSH     Status: None   Collection Time: 12/21/14 10:10 PM  Result Value Ref Range   TSH 0.934 0.350 - 4.500 uIU/mL  Hemoglobin and hematocrit, blood     Status: Abnormal  Collection Time: 12/22/14  1:40 AM  Result Value Ref Range   Hemoglobin 11.3 (L) 12.0 - 15.0 g/dL   HCT 32.3 (L) 36.0 - 83.3 %  Basic metabolic panel     Status: Abnormal   Collection Time: 12/22/14  7:01 AM  Result Value Ref Range   Sodium 136 135 - 145 mmol/L   Potassium 4.0 3.5 - 5.1 mmol/L   Chloride 105 101 - 111 mmol/L   CO2 21 (L) 22 - 32 mmol/L   Glucose, Bld 54 (L) 65 - 99 mg/dL   BUN 9 6 - 20 mg/dL   Creatinine, Ser 0.45 0.44 - 1.00 mg/dL   Calcium 8.0 (L) 8.9 - 10.3 mg/dL   GFR calc non Af Amer >60 >60 mL/min   GFR calc Af Amer >60 >60 mL/min    Comment: (NOTE) The eGFR has been calculated using the CKD EPI equation. This calculation has not been validated in all clinical situations. eGFR's persistently <60 mL/min signify possible Chronic Kidney Disease.    Anion gap 10 5 - 15  CBC     Status: Abnormal   Collection Time: 12/22/14  7:01 AM  Result Value Ref Range   WBC 4.2 4.0 - 10.5 K/uL   RBC 3.41 (L) 3.87 - 5.11 MIL/uL   Hemoglobin 10.3 (L) 12.0 - 15.0 g/dL   HCT 29.8 (L) 36.0 - 46.0 %   MCV 87.4  78.0 - 100.0 fL   MCH 30.2 26.0 - 34.0 pg   MCHC 34.6 30.0 - 36.0 g/dL   RDW 15.7 (H) 11.5 - 15.5 %   Platelets 211 150 - 400 K/uL  Hemoglobin and hematocrit, blood     Status: Abnormal   Collection Time: 12/22/14  9:41 AM  Result Value Ref Range   Hemoglobin 10.6 (L) 12.0 - 15.0 g/dL   HCT 30.4 (L) 36.0 - 46.0 %  CBC     Status: Abnormal   Collection Time: 12/22/14  3:31 PM  Result Value Ref Range   WBC 4.8 4.0 - 10.5 K/uL   RBC 3.45 (L) 3.87 - 5.11 MIL/uL   Hemoglobin 10.5 (L) 12.0 - 15.0 g/dL   HCT 30.1 (L) 36.0 - 46.0 %   MCV 87.2 78.0 - 100.0 fL   MCH 30.4 26.0 - 34.0 pg   MCHC 34.9 30.0 - 36.0 g/dL   RDW 16.0 (H) 11.5 - 15.5 %   Platelets 216 150 - 400 K/uL  MRSA PCR Screening     Status: None   Collection Time: 12/22/14  7:06 PM  Result Value Ref Range   MRSA by PCR NEGATIVE NEGATIVE    Comment:        The GeneXpert MRSA Assay (FDA approved for NASAL specimens only), is one component of a comprehensive MRSA colonization surveillance program. It is not intended to diagnose MRSA infection nor to guide or monitor treatment for MRSA infections.   CBC     Status: Abnormal   Collection Time: 12/22/14 10:51 PM  Result Value Ref Range   WBC 4.2 4.0 - 10.5 K/uL   RBC 3.42 (L) 3.87 - 5.11 MIL/uL   Hemoglobin 10.5 (L) 12.0 - 15.0 g/dL   HCT 30.2 (L) 36.0 - 46.0 %   MCV 88.3 78.0 - 100.0 fL   MCH 30.7 26.0 - 34.0 pg   MCHC 34.8 30.0 - 36.0 g/dL   RDW 16.0 (H) 11.5 - 15.5 %   Platelets 217 150 - 400 K/uL  Urine rapid drug screen (  hosp performed)     Status: Abnormal   Collection Time: 12/23/14  2:25 AM  Result Value Ref Range   Opiates POSITIVE (A) NONE DETECTED   Cocaine NONE DETECTED NONE DETECTED   Benzodiazepines POSITIVE (A) NONE DETECTED   Amphetamines NONE DETECTED NONE DETECTED   Tetrahydrocannabinol NONE DETECTED NONE DETECTED   Barbiturates NONE DETECTED NONE DETECTED    Comment:        DRUG SCREEN FOR MEDICAL PURPOSES ONLY.  IF CONFIRMATION IS  NEEDED FOR ANY PURPOSE, NOTIFY LAB WITHIN 5 DAYS.        LOWEST DETECTABLE LIMITS FOR URINE DRUG SCREEN Drug Class       Cutoff (ng/mL) Amphetamine      1000 Barbiturate      200 Benzodiazepine   119 Tricyclics       417 Opiates          300 Cocaine          300 THC              50   CBC     Status: Abnormal   Collection Time: 12/23/14  6:39 AM  Result Value Ref Range   WBC 3.9 (L) 4.0 - 10.5 K/uL   RBC 3.60 (L) 3.87 - 5.11 MIL/uL   Hemoglobin 10.9 (L) 12.0 - 15.0 g/dL   HCT 31.8 (L) 36.0 - 46.0 %   MCV 88.3 78.0 - 100.0 fL   MCH 30.3 26.0 - 34.0 pg   MCHC 34.3 30.0 - 36.0 g/dL   RDW 15.9 (H) 11.5 - 15.5 %   Platelets 207 150 - 400 K/uL    ABGS No results for input(s): PHART, PO2ART, TCO2, HCO3 in the last 72 hours.  Invalid input(s): PCO2 CULTURES Recent Results (from the past 240 hour(s))  MRSA PCR Screening     Status: None   Collection Time: 12/22/14  7:06 PM  Result Value Ref Range Status   MRSA by PCR NEGATIVE NEGATIVE Final    Comment:        The GeneXpert MRSA Assay (FDA approved for NASAL specimens only), is one component of a comprehensive MRSA colonization surveillance program. It is not intended to diagnose MRSA infection nor to guide or monitor treatment for MRSA infections.    Studies/Results: Ct Abdomen Pelvis W Contrast  12/22/2014   CLINICAL DATA:  Intermittent bright red blood per rectum, onset 5 days ago and worsened today.  EXAM: CT ABDOMEN AND PELVIS WITH CONTRAST  TECHNIQUE: Multidetector CT imaging of the abdomen and pelvis was performed using the standard protocol following bolus administration of intravenous contrast.  CONTRAST:  1m OMNIPAQUE IOHEXOL 300 MG/ML SOLN, 1040mOMNIPAQUE IOHEXOL 300 MG/ML SOLN  COMPARISON:  05/22/2014, 06/06/2013  FINDINGS: Lower chest:  No significant abnormality.  Hepatobiliary: Mild hepatic fatty infiltration. Cholecystectomy. Unremarkable bile ducts.  Pancreas: Normal  Spleen: Normal  Adrenals/Urinary  Tract: Prior right adrenalectomy. Unchanged 1.9 cm low-attenuation left adrenal nodule, consistent with adenomas. The kidneys, ureters and bladder are unremarkable.  Stomach/Bowel: Prior gastric surgery. Unremarkable small bowel and colon.  Vascular/Lymphatic: The abdominal aorta is normal in caliber. There is mild atherosclerotic calcification. There is no adenopathy in the abdomen or pelvis.  Reproductive: Prior hysterectomy.  No adnexal abnormalities.  Other: Postsurgical changes of the anterior abdominal wall remain unchanged from 06/06/2013. No drainable collection is evident.  Musculoskeletal: No significant musculoskeletal lesion.  IMPRESSION: No acute findings are evident in the abdomen or pelvis. There is mild fatty infiltration of the  liver. There is an unchanged benign appearing 1.9 cm left adrenal nodule. There are unchanged postoperative irregularities with soft tissue thickening in the anterior abdominal wall.   Electronically Signed   By: Andreas Newport M.D.   On: 12/22/2014 00:38    Medications: I have reviewed the patient's current medications.  Assesment:   Principal Problem:   Rectal bleeding Active Problems:   History of cocaine abuse   Tobacco abuse   Alcohol abuse   Chronic abdominal pain   Chronic wound infection of abdomen (HCC)   Thyroid disease   Hypertension    Plan:  Medications reviewed GI consult appreciated Will monitor CBC As GI plan    LOS: 1 day   Benedict Kue 12/23/2014, 8:24 AM

## 2014-12-23 NOTE — Anesthesia Postprocedure Evaluation (Signed)
  Anesthesia Post-op Note  Patient: Angel Price  Procedure(s) Performed: Procedure(s) with comments: COLONOSCOPY WITH PROPOFOL (N/A) - cecum time in 1408   time out  1415    total time7 minutes  ESOPHAGOGASTRODUODENOSCOPY (EGD) WITH PROPOFOL (N/A) - procedure 1  Patient Location: PACU  Anesthesia Type:MAC  Level of Consciousness: awake, alert  and oriented  Airway and Oxygen Therapy: Patient Spontanous Breathing  Post-op Pain: none  Post-op Assessment: Post-op Vital signs reviewed, Patient's Cardiovascular Status Stable, Respiratory Function Stable, Patent Airway and No signs of Nausea or vomiting              Post-op Vital Signs: Reviewed and stable  Last Vitals:  Filed Vitals:   12/23/14 1444  BP: 140/105  Pulse: 67  Temp: 36.7 C  Resp: 18    Complications: No apparent anesthesia complications

## 2014-12-23 NOTE — Care Management Note (Signed)
Case Management Note  Patient Details  Name: Angel Price MRN: [id number] Date of Birth: October 06, 1965  Subjective/Objective:                    Action/Plan:   Expected Discharge Date:  12/25/14               Expected Discharge Plan:  Home/Self Care  In-House Referral:  NA  Discharge planning Services  CM Consult  Post Acute Care Choice:  NA Choice offered to:  NA  DME Arranged:    DME Agency:     HH Arranged:    [place] Agency:     Status of Service:  Completed, signed off  Medicare Important Message Given:  Yes-second notification given Date Medicare IM Given:    Medicare IM give by:    Date Additional Medicare IM Given:    Additional Medicare Important Message give by:     If discussed at [place] of Stay Meetings, dates discussed:    Additional Comments: Anticipate discharge within 24 hours. No CM needs noted. Christinia Gully Weir, RN 12/23/2014, 11:26 AM

## 2014-12-23 NOTE — Care Management Important Message (Signed)
Important Message  Patient Details  Name: Angel Price MRN: [id number] Date of Birth: 01-Jul-1965   Medicare Important Message Given:  Yes-second notification given    Joylene Draft, RN 12/23/2014, 11:26 AM

## 2014-12-24 LAB — CBC
HCT: 29.4 % — ABNORMAL LOW (ref 36.0–46.0)
Hemoglobin: 10 g/dL — ABNORMAL LOW (ref 12.0–15.0)
MCH: 30.5 pg (ref 26.0–34.0)
MCHC: 34 g/dL (ref 30.0–36.0)
MCV: 89.6 fL (ref 78.0–100.0)
PLATELETS: 202 10*3/uL (ref 150–400)
RBC: 3.28 MIL/uL — AB (ref 3.87–5.11)
RDW: 15.9 % — ABNORMAL HIGH (ref 11.5–15.5)
WBC: 5.4 10*3/uL (ref 4.0–10.5)

## 2014-12-24 LAB — HEMOGLOBIN AND HEMATOCRIT, BLOOD
HEMATOCRIT: 30.4 % — AB (ref 36.0–46.0)
Hemoglobin: 10.3 g/dL — ABNORMAL LOW (ref 12.0–15.0)

## 2014-12-24 MED ORDER — HYDROCORTISONE ACETATE 25 MG RE SUPP
25.0000 mg | Freq: Two times a day (BID) | RECTAL | Status: DC
  Administered 2014-12-24 – 2014-12-25 (×2): 25 mg via RECTAL
  Filled 2014-12-24 (×2): qty 1

## 2014-12-24 NOTE — Addendum Note (Signed)
Addendum  created 12/24/14 1540 by Ollen Bowl, CRNA   Modules edited: Notes Section   Notes Section:  File: [id number]

## 2014-12-24 NOTE — Progress Notes (Signed)
Subjective: Patient still has rectal bleeding. She had EGD and colonoscopy. Clips were placed on the bleeding site around her previous anastomosis. Patient is also complaining of abdomial pain. Objective: Vital signs in last 24 hours: Temp:  [98 F (36.7 C)-98.7 F (37.1 C)] 98.1 F (36.7 C) (10/08 0247) Pulse Rate:  [65-78] 70 (10/08 0640) Resp:  [12-27] 20 (10/08 0640) BP: (102-147)/(77-111) 147/90 mmHg (10/08 0640) SpO2:  [94 %-100 %] 99 % (10/08 0640) Weight:  [59.421 kg (131 lb)] 59.421 kg (131 lb) (10/07 1105) Weight change:  Last BM Date: 12/23/14  Intake/Output from previous day: 10/07 0701 - 10/08 0700 In: 1380 [P.O.:480; I.V.:900] Out: 0   PHYSICAL EXAM General appearance: alert and no distress Resp: clear to auscultation bilaterally Cardio: S1, S2 normal GI: soft, non-tender; bowel sounds normal; no masses,  no organomegaly Extremities: extremities normal, atraumatic, no cyanosis or edema  Lab Results:  Results for orders placed or performed during the hospital encounter of 12/21/14 (from the past 48 hour(s))  CBC     Status: Abnormal   Collection Time: 12/22/14  3:31 PM  Result Value Ref Range   WBC 4.8 4.0 - 10.5 K/uL   RBC 3.45 (L) 3.87 - 5.11 MIL/uL   Hemoglobin 10.5 (L) 12.0 - 15.0 g/dL   HCT 30.1 (L) 36.0 - 46.0 %   MCV 87.2 78.0 - 100.0 fL   MCH 30.4 26.0 - 34.0 pg   MCHC 34.9 30.0 - 36.0 g/dL   RDW 16.0 (H) 11.5 - 15.5 %   Platelets 216 150 - 400 K/uL  MRSA PCR Screening     Status: None   Collection Time: 12/22/14  7:06 PM  Result Value Ref Range   MRSA by PCR NEGATIVE NEGATIVE    Comment:        The GeneXpert MRSA Assay (FDA approved for NASAL specimens only), is one component of a comprehensive MRSA colonization surveillance program. It is not intended to diagnose MRSA infection nor to guide or monitor treatment for MRSA infections.   CBC     Status: Abnormal   Collection Time: 12/22/14 10:51 PM  Result Value Ref Range   WBC 4.2 4.0 -  10.5 K/uL   RBC 3.42 (L) 3.87 - 5.11 MIL/uL   Hemoglobin 10.5 (L) 12.0 - 15.0 g/dL   HCT 30.2 (L) 36.0 - 46.0 %   MCV 88.3 78.0 - 100.0 fL   MCH 30.7 26.0 - 34.0 pg   MCHC 34.8 30.0 - 36.0 g/dL   RDW 16.0 (H) 11.5 - 15.5 %   Platelets 217 150 - 400 K/uL  Urine rapid drug screen (hosp performed)     Status: Abnormal   Collection Time: 12/23/14  2:25 AM  Result Value Ref Range   Opiates POSITIVE (A) NONE DETECTED   Cocaine NONE DETECTED NONE DETECTED   Benzodiazepines POSITIVE (A) NONE DETECTED   Amphetamines NONE DETECTED NONE DETECTED   Tetrahydrocannabinol NONE DETECTED NONE DETECTED   Barbiturates NONE DETECTED NONE DETECTED    Comment:        DRUG SCREEN FOR MEDICAL PURPOSES ONLY.  IF CONFIRMATION IS NEEDED FOR ANY PURPOSE, NOTIFY LAB WITHIN 5 DAYS.        LOWEST DETECTABLE LIMITS FOR URINE DRUG SCREEN Drug Class       Cutoff (ng/mL) Amphetamine      1000 Barbiturate      200 Benzodiazepine   427 Tricyclics       062 Opiates  300 Cocaine          300 THC              50   CBC     Status: Abnormal   Collection Time: 12/23/14  6:39 AM  Result Value Ref Range   WBC 3.9 (L) 4.0 - 10.5 K/uL   RBC 3.60 (L) 3.87 - 5.11 MIL/uL   Hemoglobin 10.9 (L) 12.0 - 15.0 g/dL   HCT 31.8 (L) 36.0 - 46.0 %   MCV 88.3 78.0 - 100.0 fL   MCH 30.3 26.0 - 34.0 pg   MCHC 34.3 30.0 - 36.0 g/dL   RDW 15.9 (H) 11.5 - 15.5 %   Platelets 207 150 - 400 K/uL  CBC     Status: Abnormal   Collection Time: 12/24/14 12:16 AM  Result Value Ref Range   WBC 5.4 4.0 - 10.5 K/uL   RBC 3.28 (L) 3.87 - 5.11 MIL/uL   Hemoglobin 10.0 (L) 12.0 - 15.0 g/dL   HCT 29.4 (L) 36.0 - 46.0 %   MCV 89.6 78.0 - 100.0 fL   MCH 30.5 26.0 - 34.0 pg   MCHC 34.0 30.0 - 36.0 g/dL   RDW 15.9 (H) 11.5 - 15.5 %   Platelets 202 150 - 400 K/uL    ABGS No results for input(s): PHART, PO2ART, TCO2, HCO3 in the last 72 hours.  Invalid input(s): PCO2 CULTURES Recent Results (from the past 240 hour(s))  MRSA PCR  Screening     Status: None   Collection Time: 12/22/14  7:06 PM  Result Value Ref Range Status   MRSA by PCR NEGATIVE NEGATIVE Final    Comment:        The GeneXpert MRSA Assay (FDA approved for NASAL specimens only), is one component of a comprehensive MRSA colonization surveillance program. It is not intended to diagnose MRSA infection nor to guide or monitor treatment for MRSA infections.    Studies/Results: No results found.  Medications: I have reviewed the patient's current medications.  Assesment:   Principal Problem:   Rectal bleeding Active Problems:   History of cocaine abuse   Tobacco abuse   Alcohol abuse   Chronic abdominal pain   Chronic wound infection of abdomen (HCC)   Thyroid disease   Hypertension   Upper GI bleed   Diverticulosis of colon with hemorrhage    Plan:  Medications reviewed Will decrease IV FLUID Will monitor CBC As GI plan    LOS: 2 days   Keerthana Vanrossum 12/24/2014, 10:36 AM

## 2014-12-24 NOTE — Anesthesia Postprocedure Evaluation (Signed)
  Anesthesia Post-op Note  Patient: Angel Price  Procedure(s) Performed: Procedure(s) with comments: COLONOSCOPY WITH PROPOFOL (N/A) - cecum time in 1408   time out  1415    total time7 minutes  ESOPHAGOGASTRODUODENOSCOPY (EGD) WITH PROPOFOL (N/A) - procedure 1  Patient Location: Nursing Unit  Anesthesia Type:MAC  Level of Consciousness: awake, alert  and oriented  Airway and Oxygen Therapy: Patient Spontanous Breathing  Post-op Pain: none  Post-op Assessment: Post-op Vital signs reviewed, Patient's Cardiovascular Status Stable, Respiratory Function Stable, Patent Airway, No signs of Nausea or vomiting and Adequate PO intake              Post-op Vital Signs: Reviewed and stable  Last Vitals:  Filed Vitals:   12/24/14 0640  BP: 147/90  Pulse: 70  Temp:   Resp: 20    Complications: No apparent anesthesia complications

## 2014-12-24 NOTE — Progress Notes (Signed)
Patient notes trivial blood per rectum. Hemoglobin remained stable at 10.o as of  12 noon today. She continues to have lower abdominal pain. She states it's along her incision line. Also complains of malodorous drainage from that area. She reminded me that she's had trouble with intra-abdominal abscesses and has required debridement.   Vital signs in last 24 hours: Temp:  [98 F (36.7 C)-98.3 F (36.8 C)] 98.1 F (36.7 C) (10/08 0247) Pulse Rate:  [65-78] 70 (10/08 0640) Resp:  [12-27] 20 (10/08 0640) BP: (102-147)/(77-111) 147/90 mmHg (10/08 0640) SpO2:  [94 %-100 %] 99 % (10/08 0640) Last BM Date: 12/24/14 General:    pleasant and cooperative in NAD Abdomen:  Nondistended. Positive bowel sounds. Extensive infraumbilical scar with overlying dressing. She does have abdominal tenderness. I cannot express any discharge. . Extremities:  Without clubbing or edema.    Intake/Output from previous day: 10/07 0701 - 10/08 0700 In: 1380 [P.O.:480; I.V.:900] Out: 0  Intake/Output this shift:    Lab Results:  Recent Labs  12/22/14 2251 12/23/14 0639 12/24/14 0016  WBC 4.2 3.9* 5.4  HGB 10.5* 10.9* 10.0*  HCT 30.2* 31.8* 29.4*  PLT 217 207 202   BMET  Recent Labs  12/21/14 2210 12/22/14 0701  NA 137 136  K 4.3 4.0  CL 101 105  CO2 24 21*  GLUCOSE 83 54*  BUN 11 9  CREATININE 0.51 0.45  CALCIUM 9.0 8.0*   LFT  Recent Labs  12/21/14 2210  PROT 7.6  ALBUMIN 3.8  AST 35  ALT 19  ALKPHOS 77  BILITOT 0.3   PT/INR No results for input(s): LABPROT, INR in the last 72 hours. Hepatitis Panel No results for input(s): HEPBSAG, HCVAB, HEPAIGM, HEPBIGM in the last 72 hours. C-Diff No results for input(s): CDIFFTOX in the last 72 hours.  Studies/Results: No results found.  Assessment: Principal Problem:   Rectal bleeding Active Problems:   History of cocaine abuse   Tobacco abuse   Alcohol abuse   Chronic abdominal pain   Chronic wound infection of abdomen  (HCC)   Thyroid disease   Hypertension   Upper GI bleed   Diverticulosis of colon with hemorrhage   Impression:  GI bleed appears to have subsided. I suspect trivial gross blood per rectum emanating from hemorrhoids. Upper GI tract lesion not felt to be very significant. Small bowel not yet imaged.  Patient has abdominal wall pain. History of a complicated intra-abdominal abscess. She did have an irregularly thickened abdominal wall on admitting CT.  Recommendations:  Add Anusol suppositories to her regimen.  Outpatient capsule study small bowel. I recommend surgery consult with Dr. Arnoldo Price so he can reassess abdominal wall issues.

## 2014-12-25 LAB — CBC
HEMATOCRIT: 31.3 % — AB (ref 36.0–46.0)
Hemoglobin: 10.6 g/dL — ABNORMAL LOW (ref 12.0–15.0)
MCH: 30.5 pg (ref 26.0–34.0)
MCHC: 33.9 g/dL (ref 30.0–36.0)
MCV: 89.9 fL (ref 78.0–100.0)
PLATELETS: 197 10*3/uL (ref 150–400)
RBC: 3.48 MIL/uL — ABNORMAL LOW (ref 3.87–5.11)
RDW: 16.1 % — AB (ref 11.5–15.5)
WBC: 5.7 10*3/uL (ref 4.0–10.5)

## 2014-12-25 MED ORDER — HYDROCORTISONE ACETATE 25 MG RE SUPP: 25.0000 mg | Freq: Two times a day (BID) | RECTAL | Status: DC

## 2014-12-25 NOTE — Discharge Summary (Signed)
Physician Discharge Summary  Patient ID: Angel Price MRN: [id number] DOB/AGE: [birth year] 49 y.o. Primary Care Physician:Corin Tilly, MD Admit date: 12/21/2014 Discharge date: 12/25/2014    Discharge Diagnoses:   Principal Problem:   Rectal bleeding Active Problems:   History of cocaine abuse   Tobacco abuse   Alcohol abuse   Chronic abdominal pain   Chronic wound infection of abdomen (HCC)   Thyroid disease   Hypertension   Upper GI bleed   Diverticulosis of colon with hemorrhage     Medication List    TAKE these medications        fluticasone 50 MCG/ACT nasal spray  Commonly known as:  FLONASE  Place 1 spray into both nostrils daily.     hydrocortisone 25 MG suppository  Commonly known as:  ANUSOL-HC  Place 1 suppository (25 mg total) rectally 2 (two) times daily.     HYDROmorphone 4 MG tablet  Commonly known as:  DILAUDID  Take 1 tablet (4 mg total) by mouth every 6 (six) hours as needed for severe pain.     ibuprofen 200 MG tablet  Commonly known as:  ADVIL,MOTRIN  Take 400 mg by mouth every 6 (six) hours as needed for mild pain or moderate pain.     lisinopril 10 MG tablet  Commonly known as:  PRINIVIL,ZESTRIL  Take 10 mg by mouth daily.     metoCLOPramide 10 MG tablet  Commonly known as:  REGLAN  Take 1 tablet (10 mg total) by mouth 4 (four) times daily -  before meals and at bedtime.     mupirocin ointment 2 %  Commonly known as:  BACTROBAN  Place into the nose 2 (two) times daily.     ondansetron 8 MG tablet  Commonly known as:  ZOFRAN  Take 1 tablet (8 mg total) by mouth every 8 (eight) hours as needed for nausea or vomiting.     pantoprazole 40 MG tablet  Commonly known as:  PROTONIX  Take 1 tablet (40 mg total) by mouth 2 (two) times daily before a meal.     sucralfate 1 G tablet  Commonly known as:  CARAFATE  Take 1 g by mouth 4 (four) times daily.        Discharged Condition: improved    Consults: GI  Significant  Diagnostic Studies: Ct Abdomen Pelvis W Contrast  12/22/2014   CLINICAL DATA:  Intermittent bright red blood per rectum, onset 5 days ago and worsened today.  EXAM: CT ABDOMEN AND PELVIS WITH CONTRAST  TECHNIQUE: Multidetector CT imaging of the abdomen and pelvis was performed using the standard protocol following bolus administration of intravenous contrast.  CONTRAST:  16mL OMNIPAQUE IOHEXOL 300 MG/ML SOLN, 148mL OMNIPAQUE IOHEXOL 300 MG/ML SOLN  COMPARISON:  05/22/2014, 06/06/2013  FINDINGS: Lower chest:  No significant abnormality.  Hepatobiliary: Mild hepatic fatty infiltration. Cholecystectomy. Unremarkable bile ducts.  Pancreas: Normal  Spleen: Normal  Adrenals/Urinary Tract: Prior right adrenalectomy. Unchanged 1.9 cm low-attenuation left adrenal nodule, consistent with adenomas. The kidneys, ureters and bladder are unremarkable.  Stomach/Bowel: Prior gastric surgery. Unremarkable small bowel and colon.  Vascular/Lymphatic: The abdominal aorta is normal in caliber. There is mild atherosclerotic calcification. There is no adenopathy in the abdomen or pelvis.  Reproductive: Prior hysterectomy.  No adnexal abnormalities.  Other: Postsurgical changes of the anterior abdominal wall remain unchanged from 06/06/2013. No drainable collection is evident.  Musculoskeletal: No significant musculoskeletal lesion.  IMPRESSION: No acute findings are evident in the abdomen or pelvis. There is  mild fatty infiltration of the liver. There is an unchanged benign appearing 1.9 cm left adrenal nodule. There are unchanged postoperative irregularities with soft tissue thickening in the anterior abdominal wall.   Electronically Signed   By: Andreas Newport M.D.   On: 12/22/2014 00:38    Lab Results: Basic Metabolic Panel: No results for input(s): NA, K, CL, CO2, GLUCOSE, BUN, CREATININE, CALCIUM, MG, PHOS in the last 72 hours. Liver Function Tests: No results for input(s): AST, ALT, ALKPHOS, BILITOT, PROT, ALBUMIN in the  last 72 hours.   CBC:  Recent Labs  12/24/14 0016 12/24/14 1147 12/25/14 0625  WBC 5.4  --  5.7  HGB 10.0* 10.3* 10.6*  HCT 29.4* 30.4* 31.3*  MCV 89.6  --  89.9  PLT 202  --  197    Recent Results (from the past 240 hour(s))  MRSA PCR Screening     Status: None   Collection Time: 12/22/14  7:06 PM  Result Value Ref Range Status   MRSA by PCR NEGATIVE NEGATIVE Final    Comment:        The GeneXpert MRSA Assay (FDA approved for NASAL specimens only), is one component of a comprehensive MRSA colonization surveillance program. It is not intended to diagnose MRSA infection nor to guide or monitor treatment for MRSA infections.      Hospital Course:   This is a 49 years old female with history of multiple medical illnesses was admitted due to rectal bleeding patient was seen by GI and was found to have bleeding from her previous anastomosis site. Clips her applied. Patient remained stable and her hemoglobin remained around 10. Patient will discharged home and will be followed by GI in out patient.  Discharge Exam: Blood pressure 170/96, pulse 57, temperature 98.1 F (36.7 C), temperature source Oral, resp. rate 18, height 5\' 3"  (1.6 m), weight 59.421 kg (131 lb), SpO2 97 %.    Disposition:  home      Signed: Amilcar Reever   12/25/2014, 11:55 AM

## 2014-12-25 NOTE — Progress Notes (Signed)
Patient discharged home.  IV removed - WNL.  Reviewed medications and instructions.   Verbalizes understanding.  Assisted off unit by staff in NAD.

## 2014-12-26 ENCOUNTER — Encounter (HOSPITAL_COMMUNITY): Payer: Self-pay | Admitting: Internal Medicine

## 2014-12-26 ENCOUNTER — Other Ambulatory Visit: Payer: Self-pay

## 2014-12-26 DIAGNOSIS — K625 Hemorrhage of anus and rectum: Secondary | ICD-10-CM | POA: Diagnosis not present

## 2014-12-26 DIAGNOSIS — D649 Anemia, unspecified: Secondary | ICD-10-CM | POA: Diagnosis not present

## 2014-12-26 DIAGNOSIS — K922 Gastrointestinal hemorrhage, unspecified: Secondary | ICD-10-CM | POA: Diagnosis not present

## 2014-12-26 NOTE — Op Note (Signed)
[facility] [address] [place], [address]   ENDOSCOPY PROCEDURE REPORT  PATIENT: Angel, Price  MR#: [id number] BIRTHDATE: 1966/02/20 , 89  yrs. old GENDER: female ENDOSCOPIST: R.  Garfield Cornea, MD FACP FACG REFERRED BY:  Conni Slipper, M.D. PROCEDURE DATE:  January 17, 2015 PROCEDURE:  EGD w/ control of bleeding INDICATIONS:  Coffee-ground emesis; hematochezia; drop in hemoglobin. MEDICATIONS: Deep sedation per Dr.  [facility] ASA CLASS:      Class III  CONSENT: The risks, benefits, limitations, alternatives and imponderables have been discussed.  The potential for biopsy, esophogeal dilation, etc. have also been reviewed.  Questions have been answered.  All parties agreeable.  Please see the history and physical in the medical record for more information.  DESCRIPTION OF PROCEDURE: After the risks benefits and alternatives of the procedure were thoroughly explained, informed consent was obtained.  The    endoscope was introduced through the mouth and advanced to the second portion of the duodenum , limited by Without limitations. The instrument was slowly withdrawn as the mucosa was fully examined. Estimated blood loss is zero unless otherwise noted in this procedure report.    Entirely normal-appearing tubular esophagus.  Stomach empty.  Status post prior gastric surgery with a single efferent limb identified. At the anastomosis, there was a suture present around which there was oozing of fresh blood.  There was a staple line on the opposite wall that looked good.  There was no anastomotic ulcer.  I did not see a tumor or infiltrating process.  The efferent limb was intubated a good 20 cm; this segment of the GI tract appeared normal.  Retroflexion view revealed a small hiatal hernia only.  The bleeding around the suture was inspected closely.  Blood appeared to be emanating from this area without any obvious mucosal defect  seen.  This bleeder was sealed with placement of (2) 360 clips. Retroflexed views revealed a hiatal hernia.     The scope was then withdrawn from the patient and the procedure completed.  COMPLICATIONS: There were no immediate complications.  ENDOSCOPIC IMPRESSION: Status post prior hemigastrectomy. Active oozing from anastomotic suture site?"hemostasis achieved as described above.  Although patient was oozing from her stomach and this could explain some of her recent coffee ground emesis, I'm not sure that this would explain her gross blood per rectum.  RECOMMENDATIONS: Decreased PPI therapy to once daily. No MRI until the clips are gone. See colonoscopy report.  REPEAT EXAM:  eSigned:  R. Garfield Cornea, MD Rosalita Chessman [facility] Jan 17, 2015 2:39 PM    CC:  CPT CODES: ICD CODES:  The ICD and CPT codes recommended by this software are interpretations from the data that the clinical staff has captured with the software.  The verification of the translation of this report to the ICD and CPT codes and modifiers is the sole responsibility of the health care institution and practicing physician where this report was generated.  [employer] will not be held responsible for the validity of the ICD and CPT codes included on this report.  AMA assumes no liability for data contained or not contained herein. CPT is a Designer, television/film set of the [employer].  PATIENT NAME:  Angel, Price MR#: [id number]

## 2014-12-27 ENCOUNTER — Ambulatory Visit (HOSPITAL_COMMUNITY): Payer: Self-pay | Admitting: Psychiatry

## 2014-12-27 DIAGNOSIS — T814XXA Infection following a procedure, initial encounter: Secondary | ICD-10-CM | POA: Diagnosis not present

## 2014-12-27 DIAGNOSIS — F17210 Nicotine dependence, cigarettes, uncomplicated: Secondary | ICD-10-CM | POA: Diagnosis not present

## 2014-12-27 DIAGNOSIS — S31109D Unspecified open wound of abdominal wall, unspecified quadrant without penetration into peritoneal cavity, subsequent encounter: Secondary | ICD-10-CM | POA: Diagnosis not present

## 2014-12-27 DIAGNOSIS — Z9181 History of falling: Secondary | ICD-10-CM | POA: Diagnosis not present

## 2014-12-27 DIAGNOSIS — N39490 Overflow incontinence: Secondary | ICD-10-CM | POA: Diagnosis not present

## 2014-12-27 DIAGNOSIS — R109 Unspecified abdominal pain: Secondary | ICD-10-CM | POA: Diagnosis not present

## 2014-12-27 DIAGNOSIS — R112 Nausea with vomiting, unspecified: Secondary | ICD-10-CM | POA: Diagnosis not present

## 2014-12-27 DIAGNOSIS — L089 Local infection of the skin and subcutaneous tissue, unspecified: Secondary | ICD-10-CM | POA: Diagnosis not present

## 2014-12-27 DIAGNOSIS — Z9884 Bariatric surgery status: Secondary | ICD-10-CM | POA: Diagnosis not present

## 2014-12-27 DIAGNOSIS — E279 Disorder of adrenal gland, unspecified: Secondary | ICD-10-CM | POA: Diagnosis not present

## 2015-01-05 ENCOUNTER — Ambulatory Visit (HOSPITAL_COMMUNITY)
Admission: RE | Admit: 2015-01-05 | Discharge: 2015-01-05 | Disposition: A | Discharge status: Home / Self Care | Payer: Medicare Other | Source: Ambulatory Visit | Attending: Internal Medicine | Admitting: Internal Medicine

## 2015-01-05 ENCOUNTER — Encounter (HOSPITAL_COMMUNITY)
Admission: RE | Disposition: A | Discharge status: Home / Self Care | Payer: Self-pay | Source: Ambulatory Visit | Attending: Internal Medicine

## 2015-01-05 DIAGNOSIS — K625 Hemorrhage of anus and rectum: Secondary | ICD-10-CM | POA: Insufficient documentation

## 2015-01-05 HISTORY — PX: AGILE CAPSULE: SHX5420

## 2015-01-05 SURGERY — AGILE CAPSULE

## 2015-01-06 ENCOUNTER — Ambulatory Visit (HOSPITAL_COMMUNITY)
Admission: RE | Admit: 2015-01-06 | Discharge: 2015-01-06 | Disposition: A | Discharge status: Home / Self Care | Payer: Medicare Other | Source: Ambulatory Visit | Attending: Internal Medicine | Admitting: Internal Medicine

## 2015-01-06 ENCOUNTER — Encounter (HOSPITAL_COMMUNITY): Payer: Self-pay | Admitting: Internal Medicine

## 2015-01-06 DIAGNOSIS — K625 Hemorrhage of anus and rectum: Secondary | ICD-10-CM | POA: Diagnosis not present

## 2015-01-07 ENCOUNTER — Telehealth: Payer: Self-pay | Admitting: Gastroenterology

## 2015-01-07 ENCOUNTER — Encounter (HOSPITAL_COMMUNITY): Payer: Self-pay | Admitting: *Deleted

## 2015-01-07 ENCOUNTER — Emergency Department (HOSPITAL_COMMUNITY)
Admission: EM | Admit: 2015-01-07 | Discharge: 2015-01-07 | Disposition: A | Discharge status: Home / Self Care | Payer: Medicare Other | Attending: Emergency Medicine | Admitting: Emergency Medicine

## 2015-01-07 DIAGNOSIS — I10 Essential (primary) hypertension: Secondary | ICD-10-CM | POA: Insufficient documentation

## 2015-01-07 DIAGNOSIS — Z862 Personal history of diseases of the blood and blood-forming organs and certain disorders involving the immune mechanism: Secondary | ICD-10-CM | POA: Insufficient documentation

## 2015-01-07 DIAGNOSIS — K625 Hemorrhage of anus and rectum: Secondary | ICD-10-CM | POA: Insufficient documentation

## 2015-01-07 DIAGNOSIS — R630 Anorexia: Secondary | ICD-10-CM | POA: Insufficient documentation

## 2015-01-07 DIAGNOSIS — R112 Nausea with vomiting, unspecified: Secondary | ICD-10-CM | POA: Insufficient documentation

## 2015-01-07 DIAGNOSIS — Z86010 Personal history of colonic polyps: Secondary | ICD-10-CM | POA: Diagnosis not present

## 2015-01-07 DIAGNOSIS — Z980 Intestinal bypass and anastomosis status: Secondary | ICD-10-CM | POA: Insufficient documentation

## 2015-01-07 DIAGNOSIS — Z79899 Other long term (current) drug therapy: Secondary | ICD-10-CM | POA: Insufficient documentation

## 2015-01-07 DIAGNOSIS — K219 Gastro-esophageal reflux disease without esophagitis: Secondary | ICD-10-CM | POA: Diagnosis not present

## 2015-01-07 DIAGNOSIS — Z720 Tobacco use: Secondary | ICD-10-CM | POA: Insufficient documentation

## 2015-01-07 DIAGNOSIS — Z8659 Personal history of other mental and behavioral disorders: Secondary | ICD-10-CM | POA: Insufficient documentation

## 2015-01-07 DIAGNOSIS — Z87738 Personal history of other specified (corrected) congenital malformations of digestive system: Secondary | ICD-10-CM | POA: Insufficient documentation

## 2015-01-07 DIAGNOSIS — Z7951 Long term (current) use of inhaled steroids: Secondary | ICD-10-CM | POA: Diagnosis not present

## 2015-01-07 DIAGNOSIS — Z872 Personal history of diseases of the skin and subcutaneous tissue: Secondary | ICD-10-CM | POA: Insufficient documentation

## 2015-01-07 DIAGNOSIS — G8929 Other chronic pain: Secondary | ICD-10-CM | POA: Diagnosis not present

## 2015-01-07 DIAGNOSIS — Z9889 Other specified postprocedural states: Secondary | ICD-10-CM | POA: Insufficient documentation

## 2015-01-07 LAB — CBC WITH DIFFERENTIAL/PLATELET
BASOS ABS: 0 10*3/uL (ref 0.0–0.1)
BASOS PCT: 1 %
Eosinophils Absolute: 0.1 10*3/uL (ref 0.0–0.7)
Eosinophils Relative: 2 %
HEMATOCRIT: 33.1 % — AB (ref 36.0–46.0)
HEMOGLOBIN: 11.6 g/dL — AB (ref 12.0–15.0)
Lymphocytes Relative: 31 %
Lymphs Abs: 1.4 10*3/uL (ref 0.7–4.0)
MCH: 30.4 pg (ref 26.0–34.0)
MCHC: 35 g/dL (ref 30.0–36.0)
MCV: 86.9 fL (ref 78.0–100.0)
Monocytes Absolute: 0.5 10*3/uL (ref 0.1–1.0)
Monocytes Relative: 10 %
NEUTROS ABS: 2.5 10*3/uL (ref 1.7–7.7)
NEUTROS PCT: 56 %
Platelets: 299 10*3/uL (ref 150–400)
RBC: 3.81 MIL/uL — AB (ref 3.87–5.11)
RDW: 16 % — ABNORMAL HIGH (ref 11.5–15.5)
WBC: 4.5 10*3/uL (ref 4.0–10.5)

## 2015-01-07 LAB — COMPREHENSIVE METABOLIC PANEL
ALK PHOS: 80 U/L (ref 38–126)
ALT: 26 U/L (ref 14–54)
ANION GAP: 8 (ref 5–15)
AST: 48 U/L — ABNORMAL HIGH (ref 15–41)
Albumin: 4 g/dL (ref 3.5–5.0)
BILIRUBIN TOTAL: 0.4 mg/dL (ref 0.3–1.2)
BUN: 6 mg/dL (ref 6–20)
CALCIUM: 9.5 mg/dL (ref 8.9–10.3)
CO2: 26 mmol/L (ref 22–32)
Chloride: 103 mmol/L (ref 101–111)
Creatinine, Ser: 0.47 mg/dL (ref 0.44–1.00)
GFR calc non Af Amer: >60 mL/min (ref >60)
Glucose, Bld: 74 mg/dL (ref 65–99)
Potassium: 4.2 mmol/L (ref 3.5–5.1)
SODIUM: 137 mmol/L (ref 135–145)
TOTAL PROTEIN: 7.5 g/dL (ref 6.5–8.1)

## 2015-01-07 MED ORDER — ONDANSETRON HCL 4 MG/2ML IJ SOLN
4.0000 mg | Freq: Once | INTRAMUSCULAR | Status: AC
  Administered 2015-01-07: 4 mg via INTRAVENOUS
  Filled 2015-01-07: qty 2

## 2015-01-07 MED ORDER — SODIUM CHLORIDE 0.9 % IV BOLUS (SEPSIS)
1000.0000 mL | Freq: Once | INTRAVENOUS | Status: AC
  Administered 2015-01-07: 1000 mL via INTRAVENOUS

## 2015-01-07 MED ORDER — PROMETHAZINE HCL 25 MG PO TABS: 25.0000 mg | ORAL_TABLET | Freq: Four times a day (QID) | ORAL | Status: DC | PRN

## 2015-01-07 MED ORDER — HYDROMORPHONE HCL 1 MG/ML IJ SOLN
1.0000 mg | Freq: Once | INTRAMUSCULAR | Status: AC
  Administered 2015-01-07: 1 mg via INTRAVENOUS
  Filled 2015-01-07: qty 1

## 2015-01-07 NOTE — ED Notes (Signed)
Patient hemoccult positive.

## 2015-01-07 NOTE — ED Notes (Signed)
Patient is resting comfortably. 

## 2015-01-07 NOTE — ED Notes (Addendum)
IV removed due to malposition, approx 500 ml infused. IV removed. Cyndi Bender, PA notified.

## 2015-01-07 NOTE — Telephone Encounter (Signed)
CALLED BY TRIPLETT, PA. PT SEEN IN ED FOR RECTAL BLEEDING. BP STABLE. Hb BETTER THAN IN EARLY OCT 2016. D/C HOME WITH [facility] CREAM QID FOR 10 DAYS. CALL OFC MON IF SHE IS NOT BETTER OR HAS QUESTIONS.

## 2015-01-07 NOTE — ED Provider Notes (Signed)
CSN: [id number]     Arrival date & time 01/07/15  3295 History   First MD Initiated Contact with Patient 01/07/15 [phone]     Chief Complaint  Patient presents with  . Rectal Bleeding     (Consider location/radiation/quality/duration/timing/severity/associated sxs/prior Treatment) HPI   Angel Price is a 49 y.o. female with an extensive GI history,  presents to the Emergency Department complaining of recurrent abdominal pain and rectal bleeding.  Symptoms have been present for one week.  She was seen here and admitted for same 12/21/14 and subsequently had surgical clips placed for what appeared to be bleeding from a previous anastomosis. She denies problems until this past week.  She was seen by her GI, Dr. Gala Romney, on Thursday and swallowed a diagnostic camera, she had a plain film of her abdomen yesterday, but she states she does not know the results.  She also complains of continued pain and drainage of her abdomen around a previous surgical site that is not completely healed.  She describes persistent nausea and intermittent vomiting this week and bleeding from her rectum that is bright red in color with each BM.  She describes an "urge" to defecate, but states it is mostly blood and large clots with very little stool.  She denies fever, dysuria, chills, and bloody vomitus.    Past Medical History  Diagnosis Date  . Lupus ([place])   . 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 Hypertension   . Abscess     soft tissue  . Suicide attempt ([place])   . Blood transfusion without reported diagnosis   . Chronic abdominal pain   . Chronic wound infection of abdomen ([place])   . Adrenal mass ([place])   . Anxiety   . Depression   . GERD (gastroesophageal reflux disease)   . Sickle cell trait ([place])   . Gastroparesis Nov 2015  . Nausea and vomiting     chronic, recurrent  . Alcohol abuse   . History of Billroth II operation   . Lung nodule < 6cm on CT 04/25/2014  . Hemorrhoid     internal  large  . Diverticulosis     colonoscopy 04/2014 moderat pan colonic  . Colon polyp     colonoscopy 04/2014  . Gastritis     EGD 05/2014  . Hiatal hernia   . Schatzki's ring     patent per EGD 04/2014  . Lung nodule     CT 02/2014 needs repeat 1 month   Past Surgical History  Procedure Laterality Date  . Abdominal surgery    . Abdominal hysterectomy  2013    [place]  . Debridement of abdominal wall abscess N/A 02/08/2013    Procedure: DEBRIDEMENT OF ABDOMINAL WALL ABSCESS;  Surgeon: Jamesetta So, MD;  Location: [facility];  Service: General;  Laterality: N/A;  . Billroth ii procedure       [place], first 2000, 2005/2006.  . Colonoscopy      in [place]  . Cholecystectomy    . Colonoscopy with propofol N/A 05/20/2013    Dr.Rourk- inadequate prep, normal appearing rectum, grossly normal colon aside from pancolonic diverticula, normal terminal ileum bx= unremarkable colonic mucosa. Due for early interval 2016.   Marland Kitchen Esophagogastroduodenoscopy (egd) with propofol N/A 05/20/2013    Dr.Rourk- s/p prior gastric surgery with normal esophagus, residual gastric mucosa and patent efferent limb  . Esophageal biopsy  05/20/2013    Procedure: BIOPSIES OF ASCENDING AND SIGMOID COLON;  Surgeon: Herbie Baltimore  Hilton Cork, MD;  Location: [facility];  Service: Endoscopy;;  . Wound exploration Right 06/24/2013    Procedure: exploration of traumatic wound right wrist;  Surgeon: Tennis Must, MD;  Location: [facility];  Service: Orthopedics;  Laterality: Right;  . Tendon repar Right     wrist  . Adrenal turmor removal    . Esophagogastroduodenoscopy (egd) with propofol N/A 02/03/2014    Dr. Gala Romney:  s/p hemigastrectomy with retained gastric contents. Residual gastric mucosa and efferent limb appeared normal otherwise. Query gastroparesis.   . Colonoscopy with propofol N/A 04/26/2014    WVP:XTGGYI ileum/one colon polyp removed/moderate pan-colonic diverticulosis/large internal hemorrhoids  . Esophagogastroduodenoscopy (egd) with  propofol N/A 04/26/2014    RSW:NIOEVOJJ'K ring/small HH/mild non-erosive gasrtitis/normal anastomosis  . Esophageal biopsy  04/26/2014    Procedure: BIOPSIES;  Surgeon: Danie Binder, MD;  Location: [facility];  Service: Endoscopy;;  . Colonoscopy with propofol N/A 12/23/2014    Procedure: COLONOSCOPY WITH PROPOFOL;  Surgeon: Daneil Dolin, MD;  Location: [facility];  Service: Endoscopy;  Laterality: N/A;  cecum time in 1408   time out  1415    total time7 minutes  . Esophagogastroduodenoscopy (egd) with propofol N/A 12/23/2014    Procedure:  ESOPHAGOGASTRODUODENOSCOPY (EGD) WITH PROPOFOL;  Surgeon: Daneil Dolin, MD;  Location: [facility];  Service: Endoscopy;  Laterality: N/A;  procedure 1  . Agile capsule N/A 01/05/2015    Procedure: AGILE CAPSULE;  Surgeon: Daneil Dolin, MD;  Location: [facility];  Service: Endoscopy;  Laterality: N/A;  0700   Family History  Problem Relation Age of Onset  . Brain cancer Son   . Schizophrenia Son   . Cancer Son     brain  . Breast cancer Maternal Aunt   . Bipolar disorder Maternal Aunt   . Drug abuse Maternal Aunt   . Colon cancer Maternal Grandmother     late 65s, early 69s  . Lung cancer Father   . Cancer Father     mets  . Liver disease Neg Hx   . Drug abuse Mother   . Drug abuse Sister   . Drug abuse Brother   . Bipolar disorder Paternal Grandfather   . Bipolar disorder Cousin    Social History  Substance Use Topics  . Smoking status: Current Every Day Smoker -- 0.25 packs/day for 35 years    Types: Cigarettes  . Smokeless tobacco: Never Used  . Alcohol Use: 7.2 oz/week    12 Cans of beer, 0 Standard drinks or equivalent per week     Comment: ETOH abuse. as of 04/25/14, states drinking 1-2 beers per day   OB History    No data available     Review of Systems  Constitutional: Positive for appetite change. Negative for fever and chills.  Respiratory: Negative for shortness of breath.   Cardiovascular: Negative for chest pain.   Gastrointestinal: Positive for nausea, vomiting, abdominal pain, blood in stool and anal bleeding.  Genitourinary: Negative for dysuria, flank pain, decreased urine volume and difficulty urinating.  Musculoskeletal: Negative for back pain.  Skin: Negative for rash.  Neurological: Negative for dizziness, syncope, weakness and numbness.  Hematological: Negative for adenopathy.  All other systems reviewed and are negative.     Allergies  Codeine and Morphine and related  Home Medications   Prior to Admission medications   Medication Sig Start Date End Date Taking? Authorizing Provider  fluticasone (FLONASE) 50 MCG/ACT nasal spray Place 1 spray into both nostrils  daily.    Historical Provider, MD  hydrocortisone (ANUSOL-HC) 25 MG suppository Place 1 suppository (25 mg total) rectally 2 (two) times daily. 12/25/14   Rosita Fire, MD  ibuprofen (ADVIL,MOTRIN) 200 MG tablet Take 400 mg by mouth every 6 (six) hours as needed for mild pain or moderate pain.     Historical Provider, MD  lisinopril (PRINIVIL,ZESTRIL) 10 MG tablet Take 10 mg by mouth daily.    Historical Provider, MD  metoCLOPramide (REGLAN) 10 MG tablet Take 1 tablet (10 mg total) by mouth 4 (four) times daily -  before meals and at bedtime. 04/06/14   Radene Gunning, NP  ondansetron (ZOFRAN) 8 MG tablet Take 1 tablet (8 mg total) by mouth every 8 (eight) hours as needed for nausea or vomiting. 09/04/14   Daleen Bo, MD  pantoprazole (PROTONIX) 40 MG tablet Take 1 tablet (40 mg total) by mouth 2 (two) times daily before a meal. Patient taking differently: Take 40 mg by mouth daily.  09/04/14   Daleen Bo, MD  sucralfate (CARAFATE) 1 G tablet Take 1 g by mouth 4 (four) times daily.    Historical Provider, MD   BP 118/88 mmHg  Pulse 105  Temp(Src) 98.2 F (36.8 C) (Oral)  Resp 14  Ht 5\' 3"  (1.6 m)  Wt 136 lb (61.689 kg)  BMI 24.10 kg/m2  SpO2 100% Physical Exam  Constitutional: She is oriented to person, place, and time.  She appears well-developed and well-nourished. No distress.  HENT:  Head: Normocephalic and atraumatic.  Mouth/Throat: Oropharynx is clear and moist.  Eyes: Pupils are equal, round, and reactive to light. No scleral icterus.  Cardiovascular: Normal rate, regular rhythm and normal heart sounds.   Pulmonary/Chest: Effort normal and breath sounds normal. No respiratory distress.  Abdominal: Soft. Bowel sounds are normal. She exhibits no distension. There is tenderness. There is no rebound and no guarding.  Midline abdominal scar with two, 2 cm chronic appearing, open wounds with green to yellow purulent drainage. No surrounding erythema.  Slight tenderness to palpation.  No guarding or rbound  Genitourinary: Guaiac positive stool.  Small amt of bright red blood per rectum, heme pos stool.  No palpable masses on digital exam  Musculoskeletal: Normal range of motion.  Neurological: She is alert and oriented to person, place, and time. She exhibits normal muscle tone. Coordination normal.  Skin: Skin is warm and dry.  Psychiatric: She has a normal mood and affect.  Nursing note and vitals reviewed.   ED Course  Procedures (including critical care time) Labs Review Labs Reviewed  COMPREHENSIVE METABOLIC PANEL - Abnormal; Notable for the following:    AST 48 (*)    All other components within normal limits  CBC WITH DIFFERENTIAL/PLATELET - Abnormal; Notable for the following:    RBC 3.81 (*)    Hemoglobin 11.6 (*)    HCT 33.1 (*)    RDW 16.0 (*)    All other components within normal limits    Imaging Review Dg Abd 1 View - Kub  01/06/2015  CLINICAL DATA:  Rectal bleeding.  Capsule endoscopy yesterday. EXAM: ABDOMEN - 1 VIEW COMPARISON:  CT abdomen pelvis 12/22/2014 FINDINGS: Cylindrical density measuring 10 x 20 mm in the right lower quadrant. This could represent endoscopy capsule but does not have a typical appearance. Correlate with ingested capsule. Negative for bowel obstruction.  Bowel sutures around the stomach. Surgical clips around the right kidney and gallbladder fossa. Bowel clips in the region of the right colon.  No renal calculi IMPRESSION: Negative for bowel obstruction Foreign body overlying the right lower quadrant, possibly endoscopy capsule although not typical in appearance. Electronically Signed   By: Franchot Gallo M.D.   On: 01/06/2015 13:36   I have personally reviewed and evaluated these images and lab results as part of my medical decision-making.     MDM   Final diagnoses:  Rectal bleeding    Pt's previous hospital admission notes reviewed by me, rectal bleeding appears to be a recurrent issue and may be related to internal hemorrhoids.  H&H stable on her previous admission and she was seen by GI earlier this week and ingested a diagnostic camera  Labs today are reassuring, very scant amt of bright red blood on digital exam.  Pt is well appearing, hemodynamically stable.    [place] [facility] [facility], discussed pt findings and labs.  Recommends [employer] hemorrhoid cream and have pt call office on Monday for f/u.  Pt agrees to plan, feeling better and appears stable for d/c  Kem Parkinson, PA-C 01/08/15 2022  Alfonzo Beers, MD 01/17/15 2303

## 2015-01-07 NOTE — ED Notes (Signed)
Rectal bleeding and abdominal pain onset a week ago, saw GI 2 days ago and swallowed a diagnostic camera. States she has passed the camera yet?

## 2015-01-09 LAB — POC OCCULT BLOOD, ED: Fecal Occult Bld: POSITIVE — AB

## 2015-01-11 ENCOUNTER — Ambulatory Visit (HOSPITAL_COMMUNITY): Payer: Self-pay | Admitting: Psychiatry

## 2015-01-31 DIAGNOSIS — E279 Disorder of adrenal gland, unspecified: Secondary | ICD-10-CM | POA: Diagnosis not present

## 2015-01-31 DIAGNOSIS — D3A8 Other benign neuroendocrine tumors: Secondary | ICD-10-CM | POA: Diagnosis not present

## 2015-02-09 ENCOUNTER — Encounter (HOSPITAL_COMMUNITY): Payer: Self-pay | Admitting: *Deleted

## 2015-02-09 ENCOUNTER — Emergency Department (HOSPITAL_COMMUNITY)
Admission: EM | Admit: 2015-02-09 | Discharge: 2015-02-10 | Disposition: A | Discharge status: Home / Self Care | Payer: Medicare Other | Attending: Emergency Medicine | Admitting: Emergency Medicine

## 2015-02-09 ENCOUNTER — Emergency Department (HOSPITAL_COMMUNITY): Payer: Medicare Other

## 2015-02-09 DIAGNOSIS — F10929 Alcohol use, unspecified with intoxication, unspecified: Secondary | ICD-10-CM

## 2015-02-09 DIAGNOSIS — Z862 Personal history of diseases of the blood and blood-forming organs and certain disorders involving the immune mechanism: Secondary | ICD-10-CM | POA: Insufficient documentation

## 2015-02-09 DIAGNOSIS — Z7952 Long term (current) use of systemic steroids: Secondary | ICD-10-CM | POA: Insufficient documentation

## 2015-02-09 DIAGNOSIS — F1010 Alcohol abuse, uncomplicated: Secondary | ICD-10-CM | POA: Diagnosis not present

## 2015-02-09 DIAGNOSIS — I10 Essential (primary) hypertension: Secondary | ICD-10-CM | POA: Insufficient documentation

## 2015-02-09 DIAGNOSIS — R4182 Altered mental status, unspecified: Secondary | ICD-10-CM | POA: Diagnosis present

## 2015-02-09 DIAGNOSIS — Z87738 Personal history of other specified (corrected) congenital malformations of digestive system: Secondary | ICD-10-CM | POA: Insufficient documentation

## 2015-02-09 DIAGNOSIS — Z86010 Personal history of colonic polyps: Secondary | ICD-10-CM | POA: Insufficient documentation

## 2015-02-09 DIAGNOSIS — F17210 Nicotine dependence, cigarettes, uncomplicated: Secondary | ICD-10-CM | POA: Insufficient documentation

## 2015-02-09 DIAGNOSIS — G8929 Other chronic pain: Secondary | ICD-10-CM | POA: Diagnosis not present

## 2015-02-09 DIAGNOSIS — R110 Nausea: Secondary | ICD-10-CM

## 2015-02-09 DIAGNOSIS — F10129 Alcohol abuse with intoxication, unspecified: Secondary | ICD-10-CM

## 2015-02-09 DIAGNOSIS — F54 Psychological and behavioral factors associated with disorders or diseases classified elsewhere: Secondary | ICD-10-CM

## 2015-02-09 DIAGNOSIS — Z872 Personal history of diseases of the skin and subcutaneous tissue: Secondary | ICD-10-CM | POA: Diagnosis not present

## 2015-02-09 DIAGNOSIS — K219 Gastro-esophageal reflux disease without esophagitis: Secondary | ICD-10-CM | POA: Diagnosis not present

## 2015-02-09 DIAGNOSIS — R51 Headache: Secondary | ICD-10-CM | POA: Diagnosis not present

## 2015-02-09 DIAGNOSIS — M6281 Muscle weakness (generalized): Secondary | ICD-10-CM

## 2015-02-09 DIAGNOSIS — Z79899 Other long term (current) drug therapy: Secondary | ICD-10-CM | POA: Diagnosis not present

## 2015-02-09 DIAGNOSIS — Z7951 Long term (current) use of inhaled steroids: Secondary | ICD-10-CM | POA: Diagnosis not present

## 2015-02-09 DIAGNOSIS — R531 Weakness: Secondary | ICD-10-CM

## 2015-02-09 DIAGNOSIS — Y908 Blood alcohol level of 240 mg/100 ml or more: Secondary | ICD-10-CM | POA: Diagnosis not present

## 2015-02-09 LAB — CBC WITH DIFFERENTIAL/PLATELET
BASOS ABS: 0 10*3/uL (ref 0.0–0.1)
BASOS PCT: 1 %
EOS PCT: 1 %
Eosinophils Absolute: 0 10*3/uL (ref 0.0–0.7)
HEMATOCRIT: 37.8 % (ref 36.0–46.0)
HEMOGLOBIN: 13 g/dL (ref 12.0–15.0)
LYMPHS ABS: 2.1 10*3/uL (ref 0.7–4.0)
LYMPHS PCT: 52 %
MCH: 29.4 pg (ref 26.0–34.0)
MCHC: 34.4 g/dL (ref 30.0–36.0)
MCV: 85.5 fL (ref 78.0–100.0)
MONO ABS: 0.4 10*3/uL (ref 0.1–1.0)
Monocytes Relative: 9 %
Neutro Abs: 1.4 10*3/uL — ABNORMAL LOW (ref 1.7–7.7)
Neutrophils Relative %: 37 %
Platelets: 300 10*3/uL (ref 150–400)
RBC: 4.42 MIL/uL (ref 3.87–5.11)
RDW: 16.2 % — ABNORMAL HIGH (ref 11.5–15.5)
WBC: 3.9 10*3/uL — ABNORMAL LOW (ref 4.0–10.5)

## 2015-02-09 LAB — COMPREHENSIVE METABOLIC PANEL
ALBUMIN: 4.3 g/dL (ref 3.5–5.0)
ALK PHOS: 115 U/L (ref 38–126)
ALT: 34 U/L (ref 14–54)
AST: 89 U/L — AB (ref 15–41)
Anion gap: 13 (ref 5–15)
BILIRUBIN TOTAL: 0.3 mg/dL (ref 0.3–1.2)
BUN: 9 mg/dL (ref 6–20)
CALCIUM: 8.9 mg/dL (ref 8.9–10.3)
CO2: 19 mmol/L — AB (ref 22–32)
Chloride: 104 mmol/L (ref 101–111)
Creatinine, Ser: 0.47 mg/dL (ref 0.44–1.00)
GFR calc Af Amer: >60 mL/min (ref >60)
GFR calc non Af Amer: >60 mL/min (ref >60)
GLUCOSE: 105 mg/dL — AB (ref 65–99)
Potassium: 4 mmol/L (ref 3.5–5.1)
Sodium: 136 mmol/L (ref 135–145)
TOTAL PROTEIN: 8.3 g/dL — AB (ref 6.5–8.1)

## 2015-02-09 LAB — ETHANOL: Alcohol, Ethyl (B): 300 mg/dL — ABNORMAL HIGH (ref <5)

## 2015-02-09 LAB — ACETAMINOPHEN LEVEL: Acetaminophen (Tylenol), Serum: <10 ug/mL — ABNORMAL LOW (ref 10–30)

## 2015-02-09 LAB — SALICYLATE LEVEL: Salicylate Lvl: <4 mg/dL (ref 2.8–30.0)

## 2015-02-09 LAB — AMMONIA: AMMONIA: 40 umol/L — AB (ref 9–35)

## 2015-02-09 MED ORDER — LORAZEPAM 2 MG/ML IJ SOLN
0.5000 mg | Freq: Once | INTRAMUSCULAR | Status: AC
  Administered 2015-02-10: 0.5 mg via INTRAVENOUS
  Filled 2015-02-09: qty 1

## 2015-02-09 MED ORDER — FOLIC ACID 5 MG/ML IJ SOLN
1.0000 mg | Freq: Once | INTRAMUSCULAR | Status: AC
  Administered 2015-02-10: 1 mg via INTRAVENOUS
  Filled 2015-02-09: qty 0.2

## 2015-02-09 MED ORDER — SODIUM CHLORIDE 0.9 % IV BOLUS (SEPSIS)
1000.0000 mL | Freq: Once | INTRAVENOUS | Status: AC
  Administered 2015-02-10: 1000 mL via INTRAVENOUS

## 2015-02-09 MED ORDER — ONDANSETRON 8 MG PO TBDP
8.0000 mg | ORAL_TABLET | Freq: Once | ORAL | Status: AC
  Administered 2015-02-09: 8 mg via ORAL
  Filled 2015-02-09: qty 1

## 2015-02-09 MED ORDER — THIAMINE HCL 100 MG/ML IJ SOLN
100.0000 mg | Freq: Once | INTRAMUSCULAR | Status: AC
  Administered 2015-02-10: 100 mg via INTRAVENOUS
  Filled 2015-02-09: qty 2

## 2015-02-09 NOTE — ED Notes (Addendum)
Upon walking past room observed pt holding a cell phone over her in mid air using both left and right hands

## 2015-02-09 NOTE — ED Notes (Signed)
Son Angel Price [phone]

## 2015-02-09 NOTE — ED Provider Notes (Signed)
CSN: [id number]     Arrival date & time 02/09/15  2025 History   First MD Initiated Contact with Patient 02/09/15 2030     Chief Complaint  Patient presents with  . Altered Mental Status     (Consider location/radiation/quality/duration/timing/severity/associated sxs/prior Treatment) The history is provided by the patient, a relative and the EMS personnel.   MARTIZA RASK is a 49 y.o. female the past medical history outlined below, most significant for anxiety, depression and alcohol abuse presenting with altered mental status.  She endorses that she has felt fatigued for the past 3 days, although helped prepare Thanksgiving dinner yesterday with the help of her mother.  She reports she was laying down in her bedroom when her children arrived and when her son when in the room he describes she was staring and not responding to him and was breathing very shallow.  EMS was called and she promptly responded to ammonia inhalant.  She does report drinking 3 beers and having one shot of vodka today, denies being intoxicated.  She states she had a 3 day history of increasing left sided weakness including arm and leg, but "just thought I was tired".  At this time she reports generalized headache.  She denies dizziness or visual changes.  She also endorses abdominal pain at the site of a chronic wound infection which is unchanged.    Past Medical History  Diagnosis Date  . Lupus ([place])   . 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 Hypertension   . Abscess     soft tissue  . Suicide attempt ([place])   . Blood transfusion without reported diagnosis   . Chronic abdominal pain   . Chronic wound infection of abdomen ([place])   . Adrenal mass ([place])   . Anxiety   . Depression   . GERD (gastroesophageal reflux disease)   . Sickle cell trait ([place])   . Gastroparesis Nov 2015  . Nausea and vomiting     chronic, recurrent  . Alcohol abuse   . History of Billroth II operation   . Lung nodule < 6cm  on CT 04/25/2014  . Hemorrhoid     internal large  . Diverticulosis     colonoscopy 04/2014 moderat pan colonic  . Colon polyp     colonoscopy 04/2014  . Gastritis     EGD 05/2014  . Hiatal hernia   . Schatzki's ring     patent per EGD 04/2014  . Lung nodule     CT 02/2014 needs repeat 1 month   Past Surgical History  Procedure Laterality Date  . Abdominal surgery    . Abdominal hysterectomy  2013    [place]  . Debridement of abdominal wall abscess N/A 02/08/2013    Procedure: DEBRIDEMENT OF ABDOMINAL WALL ABSCESS;  Surgeon: Jamesetta So, MD;  Location: [facility];  Service: General;  Laterality: N/A;  . Billroth ii procedure       [place], first 2000, 2005/2006.  . Colonoscopy      in [place]  . Cholecystectomy    . Colonoscopy with propofol N/A 05/20/2013    Dr.Rourk- inadequate prep, normal appearing rectum, grossly normal colon aside from pancolonic diverticula, normal terminal ileum bx= unremarkable colonic mucosa. Due for early interval 2016.   Marland Kitchen Esophagogastroduodenoscopy (egd) with propofol N/A 05/20/2013    Dr.Rourk- s/p prior gastric surgery with normal esophagus, residual gastric mucosa and patent efferent limb  . Esophageal biopsy  05/20/2013    Procedure:  BIOPSIES OF ASCENDING AND SIGMOID COLON;  Surgeon: Daneil Dolin, MD;  Location: [facility];  Service: Endoscopy;;  . Wound exploration Right 06/24/2013    Procedure: exploration of traumatic wound right wrist;  Surgeon: Tennis Must, MD;  Location: [facility];  Service: Orthopedics;  Laterality: Right;  . Tendon repar Right     wrist  . Adrenal turmor removal    . Esophagogastroduodenoscopy (egd) with propofol N/A 02/03/2014    Dr. Gala Romney:  s/p hemigastrectomy with retained gastric contents. Residual gastric mucosa and efferent limb appeared normal otherwise. Query gastroparesis.   . Colonoscopy with propofol N/A 04/26/2014    [id number] ileum/one colon polyp removed/moderate pan-colonic diverticulosis/large internal hemorrhoids  .  Esophagogastroduodenoscopy (egd) with propofol N/A 04/26/2014    [id number] ring/small HH/mild non-erosive gasrtitis/normal anastomosis  . Esophageal biopsy  04/26/2014    Procedure: BIOPSIES;  Surgeon: Danie Binder, MD;  Location: [facility];  Service: Endoscopy;;  . Colonoscopy with propofol N/A 12/23/2014    Procedure: COLONOSCOPY WITH PROPOFOL;  Surgeon: Daneil Dolin, MD;  Location: [facility];  Service: Endoscopy;  Laterality: N/A;  cecum time in 1408   time out  1415    total time7 minutes  . Esophagogastroduodenoscopy (egd) with propofol N/A 12/23/2014    Procedure:  ESOPHAGOGASTRODUODENOSCOPY (EGD) WITH PROPOFOL;  Surgeon: Daneil Dolin, MD;  Location: [facility];  Service: Endoscopy;  Laterality: N/A;  procedure 1  . Agile capsule N/A 01/05/2015    Procedure: AGILE CAPSULE;  Surgeon: Daneil Dolin, MD;  [facility];  Service: Endoscopy;  Laterality: N/A;  0700   Family History  Problem Relation Age of Onset  . Brain cancer Son   . Schizophrenia Son   . Cancer Son     brain  . Breast cancer Maternal Aunt   . Bipolar disorder Maternal Aunt   . Drug abuse Maternal Aunt   . Colon cancer Maternal Grandmother     late 20s, early 62s  . Lung cancer Father   . Cancer Father     mets  . Liver disease Neg Hx   . Drug abuse Mother   . Drug abuse Sister   . Drug abuse Brother   . Bipolar disorder Paternal Grandfather   . Bipolar disorder Cousin    Social History  Substance Use Topics  . Smoking status: Current Every Day Smoker -- 0.25 packs/day for 35 years    Types: Cigarettes  . Smokeless tobacco: Never Used  . Alcohol Use: 7.2 oz/week    12 Cans of beer, 0 Standard drinks or equivalent per week     Comment: ETOH abuse. as of 04/25/14, states drinking 1-2 beers per day   OB History    No data available     Review of Systems  Unable to perform ROS: Mental status change      Allergies  Codeine and Morphine and related  Home Medications   Prior to Admission  medications   Medication Sig Start Date End Date Taking? Authorizing Provider  fluticasone (FLONASE) 50 MCG/ACT nasal spray Place 1 spray into both nostrils daily.    Historical Provider, MD  hydrocortisone (ANUSOL-HC) 25 MG suppository Place 1 suppository (25 mg total) rectally 2 (two) times daily. 12/25/14   Rosita Fire, MD  ibuprofen (ADVIL,MOTRIN) 200 MG tablet Take 400 mg by mouth every 6 (six) hours as needed for mild pain or moderate pain.     Historical Provider, MD  lisinopril (PRINIVIL,ZESTRIL) 10 MG tablet Take  10 mg by mouth daily.    Historical Provider, MD  metoCLOPramide (REGLAN) 10 MG tablet Take 1 tablet (10 mg total) by mouth 4 (four) times daily -  before meals and at bedtime. 04/06/14   Radene Gunning, NP  pantoprazole (PROTONIX) 40 MG tablet Take 1 tablet (40 mg total) by mouth 2 (two) times daily before a meal. Patient taking differently: Take 40 mg by mouth daily.  09/04/14   Daleen Bo, MD  promethazine (PHENERGAN) 25 MG tablet Take 1 tablet (25 mg total) by mouth every 6 (six) hours as needed for nausea or vomiting. 01/07/15   Tammy Triplett, PA-C  sucralfate (CARAFATE) 1 G tablet Take 1 g by mouth 4 (four) times daily.    Historical Provider, MD   BP 143/103 mmHg  Pulse 100  Temp(Src) 98.5 F (36.9 C) (Oral)  Resp 20  Ht 5\' 3"  (1.6 m)  Wt 59.875 kg  BMI 23.39 kg/m2  SpO2 98% Physical Exam  Constitutional: She appears well-developed and well-nourished. No distress.  HENT:  Head: Normocephalic and atraumatic.  Eyes: Conjunctivae are normal. Pupils are equal, round, and reactive to light. Right eye exhibits no nystagmus. Left eye exhibits abnormal extraocular motion. Left eye exhibits no nystagmus.  Patient does not track to the left with EOM maneuvers.  Neck: Normal range of motion.  Cardiovascular: Normal rate, regular rhythm, normal heart sounds and intact distal pulses.   Pulmonary/Chest: Effort normal and breath sounds normal. She has no wheezes.  Abdominal:  Soft. Bowel sounds are normal. There is no tenderness.  Musculoskeletal: Normal range of motion.  Neurological: She is alert. She displays no atrophy. A cranial nerve deficit is present. No sensory deficit. She displays no Babinski's sign on the right side. She displays no Babinski's sign on the left side.  Decreased grip strength left hand. Foot drop right.  Left peripheral field of vision reduced.  Pt not entirely cooperative for cranial nerve testing.  Skin: Skin is warm and dry.  Psychiatric: She has a normal mood and affect.  Nursing note and vitals reviewed.   ED Course  Procedures (including critical care time) Labs Review Labs Reviewed  CBC WITH DIFFERENTIAL/PLATELET - Abnormal; Notable for the following:    WBC 3.9 (*)    RDW 16.2 (*)    Neutro Abs 1.4 (*)    All other components within normal limits  COMPREHENSIVE METABOLIC PANEL - Abnormal; Notable for the following:    CO2 19 (*)    Glucose, Bld 105 (*)    Total Protein 8.3 (*)    AST 89 (*)    All other components within normal limits  ETHANOL - Abnormal; Notable for the following:    Alcohol, Ethyl (B) 300 (*)    All other components within normal limits  ACETAMINOPHEN LEVEL - Abnormal; Notable for the following:    Acetaminophen (Tylenol), Serum <10 (*)    All other components within normal limits  AMMONIA - Abnormal; Notable for the following:    Ammonia 40 (*)    All other components within normal limits  SALICYLATE LEVEL  URINE RAPID DRUG SCREEN, HOSP PERFORMED  URINALYSIS, ROUTINE W REFLEX MICROSCOPIC (NOT AT [facility])    Imaging Review Ct Head Wo Contrast  02/09/2015  CLINICAL DATA:  Patient found unresponsive. Left-sided weakness and headache. Initial encounter. EXAM: CT HEAD WITHOUT CONTRAST TECHNIQUE: Contiguous axial images were obtained from the base of the skull through the vertex without intravenous contrast. COMPARISON:  CT of the head  performed 11/11/2014 FINDINGS: There is no evidence of acute  infarction, mass lesion, or intra- or extra-axial hemorrhage on CT. The posterior fossa, including the cerebellum, brainstem and fourth ventricle, is within normal limits. The third and lateral ventricles, and basal ganglia are unremarkable in appearance. The cerebral hemispheres are symmetric in appearance, with normal gray-white differentiation. No mass effect or midline shift is seen. There is no evidence of fracture; visualized osseous structures are unremarkable in appearance. The visualized portions of the orbits are within normal limits. The paranasal sinuses and mastoid air cells are well-aerated. No significant soft tissue abnormalities are seen. IMPRESSION: Unremarkable noncontrast CT of the head. Electronically Signed   By: Garald Balding M.D.   On: 02/09/2015 22:06   I have personally reviewed and evaluated these images and lab results as part of my medical decision-making.   EKG Interpretation None      MDM   Final diagnoses:  Alcohol intoxication, with unspecified complication (HCC)  Left-sided weakness    Upon first entering the room, son and daughter were at bedside.  She denies knowing who they were, then suddenly recognized daughter. Her exam of left sided weakness is inconsistent with other activities witnessed by nursing staff. Exam by Dr. Wyvonnia Dusky reconfirms left sided weakness.  However, given degree on intoxication it is not possible at present to get a thorough neuro exam.  Her CT scan negative for acute cva, however will need to be sober for re-exam and need for MRI brain.  It is unclear at present if pt has true deficit vs this being a purely etoh event.  Discussed with Dr. Marily Memos who will consult patient in ed.    Evalee Jefferson, PA-C 02/09/15 2320   Pt seen by Dr. Marily Memos who was able to obtain normal neurologic exam and felt pt complaints were mostly secondary to etoh and psych etiology.  She was given IV fluids and also given IV thiamine and folic acid.  She was  able to tolerate PO fluid intake while here.  At re-exam, pt's neuro exam was nonfocal with equal grip strength, no facial droop, ambulatory.  She was prescribed zofran due to several day history of nausea.  Discussed etoh cessation, advised f/u with her pcp or return here for any new or worsened sx.  She was given resource guide for assistance with substance abuse.  Evalee Jefferson, PA-C 02/10/15 [id number]  Ezequiel Essex, MD 02/11/15 [phone]

## 2015-02-09 NOTE — ED Notes (Signed)
Pt ambulated from bathroom and back to room, climb into bed over end of end with little help From friend

## 2015-02-09 NOTE — ED Notes (Signed)
Upon family arriving at hospital, pt states that she does not know who her son or daughter.  Pt still able to recite what day and month, where she is, and what brought her to the hospital.

## 2015-02-09 NOTE — ED Notes (Signed)
Pt arrived to er by [facility] after pt was found to be unresponsive by family, pt responded to ems after ems used an ammonia inhalent, pt admits to drinking alcohol today, denies any drug use, states "I am just tired" pt alert on arrival to er, pt states that she had three beers and one shot of vodka today,

## 2015-02-09 NOTE — ED Notes (Signed)
Renard Matter [phone]

## 2015-02-09 NOTE — ED Notes (Signed)
Entered room to document blood pressure, pt voluntarily held left arm up at 45 degree angle for blood pressure cuff.  Stated to pt "I thought you couldn't lift that arm", pt states "I couldn't"

## 2015-02-09 NOTE — ED Notes (Signed)
Pt able to assist move from EMS stretcher to hospital stretcher upon arrival, able to grab both bed rails and sit self up, held emesis bag with left hand while vomiting.  Upon assessment by PA with family present pt states she is unable to lift arm at all or move left leg.  Pt propping self up on both arms while talking.

## 2015-02-09 NOTE — ED Notes (Signed)
Pt held up left leg at a 45 degree angle while laying on right side for daughter to put sock on foot.

## 2015-02-09 NOTE — ED Notes (Signed)
Daughter Carren Rang [phone]

## 2015-02-09 NOTE — ED Notes (Addendum)
Pt ambulated to bathroom with little assistance from friend.  Gait steady and even.

## 2015-02-09 NOTE — Consult Note (Signed)
[facility] Medical Consultation  Angel Price [id number] DOB: 1965/11/13 DOA: 02/09/2015 PCP: Rosita Fire, MD   Requesting physician: PA Evalee Jefferson - PA Date of consultation: 02/09/15 Reason for consultation: L sided weakness.   Impression/Recommendations Active Problems:   * No active hospital problems. *  Left-sided weakness: Initial consultted to evaluate patient for left-sided weakness and possible stroke/TIA workup. As noted in my history of present illness patient was without any complaint of left-sided weakness throughout my evaluation and showed no neurological deficits. I feel that this complaint is likely psychosomatic in nature given patient's significant psych history including suicide attempts, depression, anxiety, baseline psychosocial stresses and alcohol abuse. Please refer to extensive nursing notes for further evaluation of patient's neurological status. CT scan negative for stroke.   Nausea, general malaise, general weakness: Likely due to ongoing alcohol abuse and potential withdrawal as patient has been decreasing her alcohol intake over the last couple of weeks. Currently alcohol level is 300. Difficult to ascertain whether not this is a level at which patient would normally be symptomatic or normal. Of note this is below what would be typically considered a level of intoxication though it is above the legal limit for vehicular use. Patient to obtain a ride home by family and/or friends. Patient should be discharged with a prescription for Zofran. There is a remote chance that this may also be due to viral gastroenteritis.  EtOH abuse: Strongly counseled patient to cut back but to do so reasonably and responsibly as abrupt cessation would cause significant symptoms and possible seizures. Recent to seek outpatient community resources       Chief Complaint: feeling sick, general malaise, and nausea  HPI:  General malaise, nausea 3 days. Denies tremor,  seizure, chest pain, shortness of breath, left-sided weakness, facial droop, slurred speech. This history is indirect juxtaposition to the patient told ED physician and PA. When asked about oral intake patient initially said that she is eating very well over the last several days and has had no change in appetite. Later on in the interview patient states that she has been very nauseated and throws up 10-11 times per day and is unable to keep anything down. Patient also states that she is a very heavy drinker and that today she had 3 beers and 1 shot of vodka around 10 AM. She states that this is less than her normal and that she's been trying to cut back over the last couple of weeks as she knows that she drinks too much.   Of note patient complained of vision loss in her left eye to the nurse. Upon my exam as outlined below patient's vision is intact in the left eye.  Denies sick contacts, diarrhea, fevers, chest pain, palpitations, shortness of breath, rash, dysuria, frequency, back pain, falls, LOC, headache.  Review of Systems:  Per history of present illness with all other systems negative. Unable to obtain reliable review of systems further due to patient's changing of her story  Past Medical History  Diagnosis Date  . Lupus ([place])   . 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 Hypertension   . Abscess     soft tissue  . Suicide attempt ([place])   . Blood transfusion without reported diagnosis   . Chronic abdominal pain   . Chronic wound infection of abdomen ([place])   . Adrenal mass ([place])   . Anxiety   . Depression   . GERD (gastroesophageal reflux disease)   . Sickle  cell trait ([place])   . Gastroparesis Nov 2015  . Nausea and vomiting     chronic, recurrent  . Alcohol abuse   . History of Billroth II operation   . Lung nodule < 6cm on CT 04/25/2014  . Hemorrhoid     internal large  . Diverticulosis     colonoscopy 04/2014 moderat pan colonic  . Colon polyp     colonoscopy  04/2014  . Gastritis     EGD 05/2014  . Hiatal hernia   . Schatzki's ring     patent per EGD 04/2014  . Lung nodule     CT 02/2014 needs repeat 1 month   Past Surgical History  Procedure Laterality Date  . Abdominal surgery    . Abdominal hysterectomy  2013    [place]  . Debridement of abdominal wall abscess N/A 02/08/2013    Procedure: DEBRIDEMENT OF ABDOMINAL WALL ABSCESS;  Surgeon: Jamesetta So, MD;  Location: [facility];  Service: General;  Laterality: N/A;  . Billroth ii procedure       [place], first 2000, 2005/2006.  . Colonoscopy      in [place]  . Cholecystectomy    . Colonoscopy with propofol N/A 05/20/2013    Dr.Rourk- inadequate prep, normal appearing rectum, grossly normal colon aside from pancolonic diverticula, normal terminal ileum bx= unremarkable colonic mucosa. Due for early interval 2016.   Marland Kitchen Esophagogastroduodenoscopy (egd) with propofol N/A 05/20/2013    Dr.Rourk- s/p prior gastric surgery with normal esophagus, residual gastric mucosa and patent efferent limb  . Esophageal biopsy  05/20/2013    Procedure: BIOPSIES OF ASCENDING AND SIGMOID COLON;  Surgeon: Daneil Dolin, MD;  Location: [facility];  Service: Endoscopy;;  . Wound exploration Right 06/24/2013    Procedure: exploration of traumatic wound right wrist;  Surgeon: Tennis Must, MD;  Location: [facility];  Service: Orthopedics;  Laterality: Right;  . Tendon repar Right     wrist  . Adrenal turmor removal    . Esophagogastroduodenoscopy (egd) with propofol N/A 02/03/2014    Dr. Gala Romney:  s/p hemigastrectomy with retained gastric contents. Residual gastric mucosa and efferent limb appeared normal otherwise. Query gastroparesis.   . Colonoscopy with propofol N/A 04/26/2014    [id number] ileum/one colon polyp removed/moderate pan-colonic diverticulosis/large internal hemorrhoids  . Esophagogastroduodenoscopy (egd) with propofol N/A 04/26/2014    [id number] ring/small HH/mild non-erosive gasrtitis/normal anastomosis  .  Esophageal biopsy  04/26/2014    Procedure: BIOPSIES;  Surgeon: Danie Binder, MD;  Location: [facility];  Service: Endoscopy;;  . Colonoscopy with propofol N/A 12/23/2014    Procedure: COLONOSCOPY WITH PROPOFOL;  Surgeon: Daneil Dolin, MD;  Location: [facility];  Service: Endoscopy;  Laterality: N/A;  cecum time in 1408   time out  1415    total time7 minutes  . Esophagogastroduodenoscopy (egd) with propofol N/A 12/23/2014    Procedure:  ESOPHAGOGASTRODUODENOSCOPY (EGD) WITH PROPOFOL;  Surgeon: Daneil Dolin, MD;  Location: [facility];  Service: Endoscopy;  Laterality: N/A;  procedure 1  . Agile capsule N/A 01/05/2015    Procedure: AGILE CAPSULE;  Surgeon: Daneil Dolin, MD;  Location: [facility];  Service: Endoscopy;  Laterality: N/A;  0700   Social History:  reports that she has been smoking Cigarettes.  She has a 8.75 pack-year smoking history. She has never used smokeless tobacco. She reports that she drinks about 7.2 oz of alcohol per week. She reports that she does not use illicit drugs.  Allergies  Allergen Reactions  . Codeine Hives  . Morphine And Related Hives    gastritis   Family History  Problem Relation Age of Onset  . Brain cancer Son   . Schizophrenia Son   . Cancer Son     brain  . Breast cancer Maternal Aunt   . Bipolar disorder Maternal Aunt   . Drug abuse Maternal Aunt   . Colon cancer Maternal Grandmother     late 58s, early 57s  . Lung cancer Father   . Cancer Father     mets  . Liver disease Neg Hx   . Drug abuse Mother   . Drug abuse Sister   . Drug abuse Brother   . Bipolar disorder Paternal Grandfather   . Bipolar disorder Cousin     Prior to Admission medications   Medication Sig Start Date End Date Taking? Authorizing Provider  fluticasone (FLONASE) 50 MCG/ACT nasal spray Place 1 spray into both nostrils daily.    Historical Provider, MD  hydrocortisone (ANUSOL-HC) 25 MG suppository Place 1 suppository (25 mg total) rectally 2 (two) times daily. 12/25/14    Rosita Fire, MD  ibuprofen (ADVIL,MOTRIN) 200 MG tablet Take 400 mg by mouth every 6 (six) hours as needed for mild pain or moderate pain.     Historical Provider, MD  lisinopril (PRINIVIL,ZESTRIL) 10 MG tablet Take 10 mg by mouth daily.    Historical Provider, MD  metoCLOPramide (REGLAN) 10 MG tablet Take 1 tablet (10 mg total) by mouth 4 (four) times daily -  before meals and at bedtime. 04/06/14   Radene Gunning, NP  pantoprazole (PROTONIX) 40 MG tablet Take 1 tablet (40 mg total) by mouth 2 (two) times daily before a meal. Patient taking differently: Take 40 mg by mouth daily.  09/04/14   Daleen Bo, MD  promethazine (PHENERGAN) 25 MG tablet Take 1 tablet (25 mg total) by mouth every 6 (six) hours as needed for nausea or vomiting. 01/07/15   Tammy Triplett, PA-C  sucralfate (CARAFATE) 1 G tablet Take 1 g by mouth 4 (four) times daily.    Historical Provider, MD   Physical Exam: Blood pressure 143/103, pulse 100, temperature 98.5 F (36.9 C), temperature source Oral, resp. rate 20, height 5\' 3"  (1.6 m), weight 59.875 kg (132 lb), SpO2 98 %. Filed Vitals:   02/09/15 2120 02/09/15 2230  BP: 124/104 143/103  Pulse: 100   Temp:    Resp: 21 20     General:  No acute distress, calm and resting peacefully  Eyes: EOMI, PERRLA, sclera normal  ENT: Moist mucous membranes, no cervical lymphadenopathy, no thyromegaly  Cardiovascular: Regular rate and rhythm, no murmurs rubs or gallops, 2+ distal pulses  Respiratory: Clear to auscultation bilaterally, normal effort  Abdomen: Normoactive bowel sounds, nontender to palpation, nondistended  Skin: No rashes or ecchymoses  Musculoskeletal: 4/5 strength in upper and lower extremities, no effusions  Psychiatric: Answers questions appropriately. Alert and oriented 3  Neurologic: Cranial nerves II through XII intact. Patient initially states that sensation on left side of face is different from that on her right. When subjected to painful  stimuli sensation is symmetric. Additionally patient stone initially deviated to the right but when asked on multiple occasions to move to the left as if she had to lift something off of her face she did so with a laugh without any difficulty. Additionally when patient's vision on right was obscured and then left was subjected to rapid stimulation blink reflex and ocular  movements were intact. Patient able to pull herself up in the bed without difficulty. No dysmetria or laterally. Moves all tremors and coordinated fashion.  Labs on Admission:  Basic Metabolic Panel:  Recent Labs Lab 02/09/15 2050  NA 136  K 4.0  CL 104  CO2 19*  GLUCOSE 105*  BUN 9  CREATININE 0.47  CALCIUM 8.9   Liver Function Tests:  Recent Labs Lab 02/09/15 2050  AST 89*  ALT 34  ALKPHOS 115  BILITOT 0.3  PROT 8.3*  ALBUMIN 4.3   No results for input(s): LIPASE, AMYLASE in the last 168 hours.  Recent Labs Lab 02/09/15 2050  AMMONIA 40*   CBC:  Recent Labs Lab 02/09/15 2050  WBC 3.9*  NEUTROABS 1.4*  HGB 13.0  HCT 37.8  MCV 85.5  PLT 300   Cardiac Enzymes: No results for input(s): CKTOTAL, CKMB, CKMBINDEX, TROPONINI in the last 168 hours. BNP: Invalid input(s): POCBNP CBG: No results for input(s): GLUCAP in the last 168 hours.  Radiological Exams on Admission: Ct Head Wo Contrast  02/09/2015  CLINICAL DATA:  Patient found unresponsive. Left-sided weakness and headache. Initial encounter. EXAM: CT HEAD WITHOUT CONTRAST TECHNIQUE: Contiguous axial images were obtained from the base of the skull through the vertex without intravenous contrast. COMPARISON:  CT of the head performed 11/11/2014 FINDINGS: There is no evidence of acute infarction, mass lesion, or intra- or extra-axial hemorrhage on CT. The posterior fossa, including the cerebellum, brainstem and fourth ventricle, is within normal limits. The third and lateral ventricles, and basal ganglia are unremarkable in appearance. The  cerebral hemispheres are symmetric in appearance, with normal gray-white differentiation. No mass effect or midline shift is seen. There is no evidence of fracture; visualized osseous structures are unremarkable in appearance. The visualized portions of the orbits are within normal limits. The paranasal sinuses and mastoid air cells are well-aerated. No significant soft tissue abnormalities are seen. IMPRESSION: Unremarkable noncontrast CT of the head. Electronically Signed   By: Garald Balding M.D.   On: 02/09/2015 22:06      [place]  If 7PM-7AM, please contact night-coverage [email or web address] Password [facility] 02/09/2015, 11:36 PM

## 2015-02-10 DIAGNOSIS — R531 Weakness: Secondary | ICD-10-CM | POA: Insufficient documentation

## 2015-02-10 DIAGNOSIS — F54 Psychological and behavioral factors associated with disorders or diseases classified elsewhere: Secondary | ICD-10-CM | POA: Insufficient documentation

## 2015-02-10 DIAGNOSIS — F10929 Alcohol use, unspecified with intoxication, unspecified: Secondary | ICD-10-CM | POA: Insufficient documentation

## 2015-02-10 DIAGNOSIS — F10129 Alcohol abuse with intoxication, unspecified: Secondary | ICD-10-CM | POA: Insufficient documentation

## 2015-02-10 MED ORDER — ONDANSETRON HCL 4 MG PO TABS: 4.0000 mg | ORAL_TABLET | Freq: Four times a day (QID) | ORAL | Status: DC

## 2015-02-10 MED ORDER — ONDANSETRON 8 MG PO TBDP: 8.0000 mg | ORAL_TABLET | Freq: Three times a day (TID) | ORAL | Status: DC | PRN

## 2015-02-10 MED ORDER — SODIUM CHLORIDE 0.9 % IV BOLUS (SEPSIS)
1000.0000 mL | Freq: Once | INTRAVENOUS | Status: AC
  Administered 2015-02-10: 1000 mL via INTRAVENOUS

## 2015-02-10 NOTE — ED Notes (Signed)
Pt states understanding of care given and follow up instructions.  Resources for alcohol abuse given.  Pt ambulated from ED

## 2015-02-10 NOTE — Discharge Instructions (Signed)
Alcohol Intoxication °Alcohol intoxication occurs when the amount of alcohol that a person has consumed impairs his or her ability to mentally and physically function. Alcohol directly impairs the normal chemical activity of the brain. Drinking large amounts of alcohol can lead to changes in mental function and behavior, and it can cause many physical effects that can be harmful.  °Alcohol intoxication can range in severity from mild to very severe. Various factors can affect the level of intoxication that occurs, such as the person's age, gender, weight, frequency of alcohol consumption, and the presence of other medical conditions (such as diabetes, seizures, or heart conditions). Dangerous levels of alcohol intoxication may occur when people drink large amounts of alcohol in a short period (binge drinking). Alcohol can also be especially dangerous when combined with certain prescription medicines or "recreational" drugs. °SIGNS AND SYMPTOMS °Some common signs and symptoms of mild alcohol intoxication include: °· Loss of coordination. °· Changes in mood and behavior. °· Impaired judgment. °· Slurred speech. °As alcohol intoxication progresses to more severe levels, other signs and symptoms will appear. These may include: °· Vomiting. °· Confusion and impaired memory. °· Slowed breathing. °· Seizures. °· Loss of consciousness. °DIAGNOSIS  °Your health care provider will take a medical history and perform a physical exam. You will be asked about the amount and type of alcohol you have consumed. Blood tests will be done to measure the concentration of alcohol in your blood. In many places, your blood alcohol level must be lower than 80 mg/dL (0.08%) to legally drive. However, many dangerous effects of alcohol can occur at much lower levels.  °TREATMENT  °People with alcohol intoxication often do not require treatment. Most of the effects of alcohol intoxication are temporary, and they go away as the alcohol naturally  leaves the body. Your health care provider will monitor your condition until you are stable enough to go home. Fluids are sometimes given through an IV access tube to help prevent dehydration.  °HOME CARE INSTRUCTIONS °· Do not drive after drinking alcohol. °· Stay hydrated. Drink enough water and fluids to keep your urine clear or pale yellow. Avoid caffeine.   °· Only take over-the-counter or prescription medicines as directed by your health care provider.   °SEEK MEDICAL CARE IF:  °· You have persistent vomiting.   °· You do not feel better after a few days. °· You have frequent alcohol intoxication. Your health care provider can help determine if you should see a substance use treatment counselor. °SEEK IMMEDIATE MEDICAL CARE IF:  °· You become shaky or tremble when you try to stop drinking.   °· You shake uncontrollably (seizure).   °· You throw up (vomit) blood. This may be bright red or may look like black coffee grounds.   °· You have blood in your stool. This may be bright red or may appear as a black, tarry, bad smelling stool.   °· You become lightheaded or faint.   °MAKE SURE YOU:  °· Understand these instructions. °· Will watch your condition. °· Will get help right away if you are not doing well or get worse. °  °This information is not intended to replace advice given to you by your health care provider. Make sure you discuss any questions you have with your health care provider. °  °Document Released: 12/12/2004 Document Revised: 11/04/2012 Document Reviewed: 08/07/2012 °Elsevier Interactive Patient Education ©2016 Elsevier Inc. ° ° °Emergency Department Resource Guide °1) Find a Doctor and Pay Out of Pocket °Although you won't have to find out   who is covered by your insurance plan, it is a good idea to ask around and get recommendations. You will then need to call the office and see if the doctor you have chosen will accept you as a new patient and what types of options they offer for patients who  are self-pay. Some doctors offer discounts or will set up payment plans for their patients who do not have insurance, but you will need to ask so you aren't surprised when you get to your appointment. ° °2) Contact Your Local Health Department °Not all health departments have doctors that can see patients for sick visits, but many do, so it is worth a call to see if yours does. If you don't know where your local health department is, you can check in your phone book. The CDC also has a tool to help you locate your state's health department, and many state websites also have listings of all of their local health departments. ° °3) Find a Walk-in Clinic °If your illness is not likely to be very severe or complicated, you may want to try a walk in clinic. These are popping up all over the country in pharmacies, drugstores, and shopping centers. They're usually staffed by nurse practitioners or physician assistants that have been trained to treat common illnesses and complaints. They're usually fairly quick and inexpensive. However, if you have serious medical issues or chronic medical problems, these are probably not your best option. ° °No Primary Care Doctor: °- Call [facility] at  832-8000 - they can help you locate a primary care doctor that  accepts your insurance, provides certain services, etc. °- Physician Referral Service- 1-800-533-3463 ° °Chronic Pain Problems: °Organization         Address  Phone   Notes  °[facility]  (336) 297-2271 Patients need to be referred by their primary care doctor.  ° °Medication Assistance: °Organization         Address  Phone   Notes  °[place] [facility] [address] °[place], [place] [address] (336) 641-8030 --Must be a resident of [place] °-- Must have NO insurance coverage whatsoever (no Medicaid/ Medicare, etc.) °-- The pt. MUST have a primary care doctor that directs their care regularly and follows  them in the community °  °[facility]  (866) 331-1348   °[employer]  (888) 892-1162   ° °Agencies that provide inexpensive medical care: °Organization         Address  Phone   Notes  °[facility]  (336) 832-8035   °[facility]    (336) 832-7272   °[facility] [address] °[place], [place] [address] (336) 832-4777   °[facility] [address], °[place] (336) 271-4999   °[facility]    (336) 373-0678   °[facility]    (336) 272-1050   °[facility] ° [address], [place] Phone:  (336) 832-4444, Fax:  (336) 832-4440 Hours of Operation:  9 am - 6 pm, M-F.  Also accepts Medicaid/Medicare and self-pay.  °[facility] ° [address], [place] Phone: (336) 832-3150, Fax: (336) 832-3151. Hours of Operation:  8:30 am - 5:30 pm, M-F.  Also accepts Medicaid and self-pay.  °[facility] [place] [address], [place] Phone: (336) 878-6027   °[facility] [address], [place], [place] (  336)723-1848, Ext. 123 Mondays & Thursdays: 7-9 AM.  First 15 patients are seen on a first come, first serve basis. °  ° °Medicaid-accepting [place] Providers: ° °Organization         Address  Phone   Notes  °[facility] [address], [place] (336) 641-2100 Also accepts self-pay patients.  °[facility] [address], [place] ° (336) 856-9996   °[facility] [address], [place] (336) 288-8857   °[facility] [address] [place] [address], [place] (336) 299-7000   °Veita Bland [address], [place]  ° (336) 373-1557 Only accepts [place] Access Medicaid patients after they have their name applied to their card.  ° °Self-Pay (no insurance) in [place]: ° °Organization         Address  Phone   Notes  °Sickle Cell  Patients, [facility] [address], [place] (336) 832-1970   °[facility] [address], [place] (336) 832-4400   °[facility] ° [address], [place] (336) 992-4800   °[facility]/Dr. Osei-Bonsu ° [address] [place] [address], [place] or [address], [place] (336) 841-8500 Phone number for both [place] and [place] locations is the same.  °[facility] [address], [place] (336) 299-0000   °[facility] [address] [place] [address], [place] or [address] (336) 852-7530 °(336) 878-2260   °[facility] [address], [place] (336) 350-1642, phone; (336) 294-5005, fax Sees patients 1st and 3rd Saturday of every month.  Must not qualify for public or private insurance (i.e. Medicaid, Medicare, [place] Health Choice, Veterans' Benefits) • Household income should be no more than 200% of the poverty level •The clinic cannot treat you if you are pregnant or think you are pregnant • Sexually transmitted diseases are not treated at the clinic.  ° ° °Dental Care: °Organization         Address  Phone  Notes  °[facility] [facility] [address], [place] (336) 641-6152 Accepts children up to age 21 who are enrolled in Medicaid or [place] Health Choice; pregnant women with a Medicaid card; and children who have applied for Medicaid or [place] Health Choice, but were declined, whose parents can pay a reduced fee at time of service.  °[facility] [place]  [address], [place] (336) 641-7733 Accepts children up to age 21 who are enrolled in Medicaid or [place] Health Choice; pregnant women with a Medicaid card; and children who have applied for Medicaid or [place] Health Choice, but were declined, whose parents can pay a reduced fee at time of service.  °[facility] ° [address],  (336) 641-4533 Patients are seen by appointment only. Walk-ins are not accepted. [facility] will see patients 18 years of age and older. °Monday - Tuesday (8am-5pm) °Most Wednesdays (8:30-5pm) °$30 per visit, cash only  °[facility] ° [address], [place] (336) 641-4533 Patients are seen by appointment only. Walk-ins are not accepted. [facility] will see patients 18 years of age and older. °One Wednesday Evening (Monthly: Volunteer Based).  $30 per visit, cash only  °[facility]  (919)   537-3737 for adults; Children under age 4, call Graduate Pediatric Dentistry at ([phone]) [phone]. Children aged 4-14, please call ([phone]) [phone] to request a pediatric application. ° Dental services are provided in all areas of dental care including fillings, crowns and bridges, complete and partial dentures, implants, gum treatment, root canals, and extractions. Preventive care is also provided. Treatment is provided to both adults and children. °Patients are selected via a lottery and there is often a waiting list. °  °[facility] [address], °[place] ° (336) 763-8833 [email or web address] °  °[facility] [address], [place], [place] (336)723-1848, Ext. 123 Second and Fourth Thursday of each month, opens at 6:30 AM; Clinic ends at 9 AM.  Patients are seen on a first-come first-served basis, and a limited number are seen during each clinic.  ° °[facility] ° [address], [place], [place] (336) 723-7904   Eligibility Requirements °You must have lived in [place], [place], or [place] counties for at least the last three months. °  You cannot be eligible for state or federal sponsored healthcare insurance, including Veterans Administration, Medicaid, or Medicare. °  You generally cannot be eligible for healthcare insurance through your employer.  °  How to apply: °Eligibility  screenings are held every Tuesday and Wednesday afternoon from 1:00 pm until 4:00 pm. You do not need an appointment for the interview!  °[facility] [address], [place], [place] 336-631-2330   °[facility]  336-342-8273   °[facility]  336-703-3100   °[facility]  336-570-6415   ° °Behavioral Health Resources in the Community: °Intensive Outpatient Programs °Organization         Address  Phone  Notes  °[facility] [address], [place], [place] 336-878-6098   °[facility] [address], [place], [place] 336-832-9800   °[facility] [address], [place], [place] ° 336-882-2125   °[facility] [address],  °[place], [place] 1-800-853-5163 or 336-641-4981   °Substance Abuse Resources °Organization         Address  Phone  Notes  °[facility]  336-882-2125   °[facility]  336-784-9470   °[facility]  336-285-9073   °[facility]  336-845-3988   °Residential & Outpatient Substance Abuse Program  1-800-659-3381   °Psychological Services °Organization         Address  Phone  Notes  °[facility]  336- 832-9600   °[facility]  336- 378-7881   °[facility] [address], [place] 1-800-853-5163 or 336-641-4981   ° °Mobile Crisis Teams °Organization         Address  Phone  Notes  °[facility], Mobile Crisis Care Unit  1-877-626-1772   °Assertive °[facility] ° [address] [place], [place] 336-834-9664   °Sharon DeEsch [address] °[place] 336-554-5454   ° °Self-Help/Support Groups °Organization         Address  Phone             Notes  °[facility]  - variety of support groups  336- 373-1402 Call for more information  °Narcotics Anonymous (NA), [facility] [address], °[place]   2 meetings at  this location  ° °Residential Treatment Programs °Organization         Address  Phone  Notes  °  [facility] [address],    °[place]  1-866-801-8205   °[facility] ° [address], [place], [place] 704-293-8524   °[facility] [address], [place] 336-845-3988 Admissions: 8am-3pm M-F  °[facility] [address]    °[place], [place] 336-841-1104   °[facility] [address], [place], [place] 336-379-7146   °[facility] [address]  °[place], [place] 336-285-9073   °[facility] Intensive Outpatient [address], [place], [place] 336-852-3033   °[facility] [address]  °[place], [place] 1-877-615-2722 or 336-784-9470   °[facility] ([facility] [address] [place], [place] 336-227-7417 Accepts Medicaid  °[facility] [address]  °[place] 1-800-659-3381 Substance Abuse/Addiction Treatment  ° °[place] Behavioral Health Resources °Organization         Address  Phone  Notes  °[facility]  (888) 581-9988   °Julie Brannon, PhD [address] [place], [place]   (336) 349-5553 or (336) 951-0000   °[facility]   [address] °[place], [place] (336) 349-4454   °[facility] [address], [place], [place] (336) 342-8316 Insurance/Medicaid/sponsorship through [facility]  °[facility] [address]                                    [place], [place] (336) 342-8316 Therapy/tele-psych/case  °[facility] [address]  ° [place], [place] (336) 349-2233    °Dr. Arfeen  (336) 349-4544   °[facility] [place]  [employer] [place] [facility] 1) [address], [place] °2) [address], [place] °3)  [address], [place] (336) 349-3220 °(336) 342-7768 ° °(336) 342-8140   °[place] [facility] (336) 342-1394 or (336)  342-3537 (After Hours)    ° ° ° °

## 2015-02-10 NOTE — ED Provider Notes (Addendum)
2:00 AM  Pt here with altered mental status. Found to be intoxicated. She has a history of chronic abdominal pain and a chronic abdominal wound and nausea. No vomiting while in the emergency department. Patient has had some mild tachycardia but likely secondary to dehydration from continued alcohol abuse. Heart rate improving with IV hydration. No sign of active withdrawal currently. She states that she is having her chronic abdominal pain and nausea which are unchanged for the past several years. At this time she is not interested in detox but will accept outpatient resources. She did have possible left-sided weakness earlier but appears to be psychosomatic. When I asked patient to move her left arm and leg she will not lift them off the bed. When I encouraged her to give me a better exam she then has normal strength bilaterally. No other focal neurologic deficit. Patient is oriented. Receiving second liter of IV fluids and then anticipate discharge home with outpatient resources.  Pt has also been evaluated by the hospitalist, please see his note for further information. He agreed that patient's weakness was psychosomatic, fluctuating. She had a normal head CT.  [place], DO 02/10/15 0147   Patient able to ambulate without difficulty. Tolerating po without vomiting.  [place], DO 02/10/15 [phone]

## 2015-02-10 NOTE — ED Notes (Signed)
Provided pt with ice chips.  Pt able to pick up small pieces of ice with left hand.  Able to feel  For and pick up ice that was dropped without looking.

## 2015-02-13 DIAGNOSIS — E279 Disorder of adrenal gland, unspecified: Secondary | ICD-10-CM | POA: Diagnosis not present

## 2015-02-28 ENCOUNTER — Ambulatory Visit: Payer: Self-pay | Admitting: Internal Medicine

## 2015-02-28 ENCOUNTER — Encounter: Payer: Self-pay | Admitting: Internal Medicine

## 2015-03-13 ENCOUNTER — Emergency Department (HOSPITAL_COMMUNITY): Payer: Medicare Other

## 2015-03-13 ENCOUNTER — Emergency Department (HOSPITAL_COMMUNITY)
Admission: EM | Admit: 2015-03-13 | Discharge: 2015-03-13 | Disposition: A | Discharge status: Home / Self Care | Payer: Medicare Other | Attending: Emergency Medicine | Admitting: Emergency Medicine

## 2015-03-13 ENCOUNTER — Encounter (HOSPITAL_COMMUNITY): Payer: Self-pay | Admitting: *Deleted

## 2015-03-13 DIAGNOSIS — R195 Other fecal abnormalities: Secondary | ICD-10-CM | POA: Diagnosis not present

## 2015-03-13 DIAGNOSIS — G8929 Other chronic pain: Secondary | ICD-10-CM | POA: Insufficient documentation

## 2015-03-13 DIAGNOSIS — I10 Essential (primary) hypertension: Secondary | ICD-10-CM | POA: Insufficient documentation

## 2015-03-13 DIAGNOSIS — R1030 Lower abdominal pain, unspecified: Secondary | ICD-10-CM | POA: Diagnosis not present

## 2015-03-13 DIAGNOSIS — R109 Unspecified abdominal pain: Secondary | ICD-10-CM

## 2015-03-13 DIAGNOSIS — R1012 Left upper quadrant pain: Secondary | ICD-10-CM | POA: Diagnosis not present

## 2015-03-13 DIAGNOSIS — Z8639 Personal history of other endocrine, nutritional and metabolic disease: Secondary | ICD-10-CM | POA: Insufficient documentation

## 2015-03-13 DIAGNOSIS — F17210 Nicotine dependence, cigarettes, uncomplicated: Secondary | ICD-10-CM | POA: Insufficient documentation

## 2015-03-13 DIAGNOSIS — Z86010 Personal history of colonic polyps: Secondary | ICD-10-CM | POA: Insufficient documentation

## 2015-03-13 DIAGNOSIS — R000 Tachycardia, unspecified: Secondary | ICD-10-CM | POA: Diagnosis not present

## 2015-03-13 DIAGNOSIS — Z87738 Personal history of other specified (corrected) congenital malformations of digestive system: Secondary | ICD-10-CM | POA: Insufficient documentation

## 2015-03-13 DIAGNOSIS — Z7951 Long term (current) use of inhaled steroids: Secondary | ICD-10-CM | POA: Insufficient documentation

## 2015-03-13 DIAGNOSIS — K219 Gastro-esophageal reflux disease without esophagitis: Secondary | ICD-10-CM | POA: Insufficient documentation

## 2015-03-13 DIAGNOSIS — F329 Major depressive disorder, single episode, unspecified: Secondary | ICD-10-CM | POA: Diagnosis not present

## 2015-03-13 DIAGNOSIS — R110 Nausea: Secondary | ICD-10-CM | POA: Diagnosis not present

## 2015-03-13 DIAGNOSIS — K625 Hemorrhage of anus and rectum: Secondary | ICD-10-CM

## 2015-03-13 DIAGNOSIS — Z872 Personal history of diseases of the skin and subcutaneous tissue: Secondary | ICD-10-CM | POA: Diagnosis not present

## 2015-03-13 DIAGNOSIS — Z79899 Other long term (current) drug therapy: Secondary | ICD-10-CM | POA: Diagnosis not present

## 2015-03-13 DIAGNOSIS — R1010 Upper abdominal pain, unspecified: Secondary | ICD-10-CM | POA: Diagnosis not present

## 2015-03-13 LAB — COMPREHENSIVE METABOLIC PANEL
ALT: 47 U/L (ref 14–54)
AST: 115 U/L — ABNORMAL HIGH (ref 15–41)
Albumin: 3.6 g/dL (ref 3.5–5.0)
Alkaline Phosphatase: 96 U/L (ref 38–126)
Anion gap: 10 (ref 5–15)
BUN: <5 mg/dL — ABNORMAL LOW (ref 6–20)
CO2: 24 mmol/L (ref 22–32)
Calcium: 8.9 mg/dL (ref 8.9–10.3)
Chloride: 101 mmol/L (ref 101–111)
Creatinine, Ser: 0.51 mg/dL (ref 0.44–1.00)
GFR calc Af Amer: >60 mL/min (ref >60)
GFR calc non Af Amer: >60 mL/min (ref >60)
Glucose, Bld: 94 mg/dL (ref 65–99)
Potassium: 4 mmol/L (ref 3.5–5.1)
Sodium: 135 mmol/L (ref 135–145)
Total Bilirubin: 0.8 mg/dL (ref 0.3–1.2)
Total Protein: 7.1 g/dL (ref 6.5–8.1)

## 2015-03-13 LAB — TYPE AND SCREEN
ABO/RH(D): O POS
Antibody Screen: NEGATIVE

## 2015-03-13 LAB — CBC
HCT: 30.9 % — ABNORMAL LOW (ref 36.0–46.0)
Hemoglobin: 10.6 g/dL — ABNORMAL LOW (ref 12.0–15.0)
MCH: 30.2 pg (ref 26.0–34.0)
MCHC: 34.3 g/dL (ref 30.0–36.0)
MCV: 88 fL (ref 78.0–100.0)
Platelets: 191 10*3/uL (ref 150–400)
RBC: 3.51 MIL/uL — ABNORMAL LOW (ref 3.87–5.11)
RDW: 17.3 % — ABNORMAL HIGH (ref 11.5–15.5)
WBC: 4.2 10*3/uL (ref 4.0–10.5)

## 2015-03-13 LAB — URINALYSIS, ROUTINE W REFLEX MICROSCOPIC
Bilirubin Urine: NEGATIVE
Glucose, UA: NEGATIVE mg/dL
Hgb urine dipstick: NEGATIVE
Ketones, ur: 15 mg/dL — AB
Leukocytes, UA: NEGATIVE
Nitrite: NEGATIVE
Protein, ur: NEGATIVE mg/dL
Specific Gravity, Urine: <1.005 — ABNORMAL LOW (ref 1.005–1.030)
pH: 5 (ref 5.0–8.0)

## 2015-03-13 MED ORDER — IOHEXOL 300 MG/ML  SOLN
100.0000 mL | Freq: Once | INTRAMUSCULAR | Status: AC | PRN
  Administered 2015-03-13: 100 mL via INTRAVENOUS

## 2015-03-13 MED ORDER — HYDROMORPHONE HCL 1 MG/ML IJ SOLN
0.5000 mg | Freq: Once | INTRAMUSCULAR | Status: AC
  Administered 2015-03-13: 0.5 mg via INTRAVENOUS
  Filled 2015-03-13: qty 1

## 2015-03-13 MED ORDER — ONDANSETRON HCL 4 MG/2ML IJ SOLN
4.0000 mg | Freq: Once | INTRAMUSCULAR | Status: AC
  Administered 2015-03-13: 4 mg via INTRAMUSCULAR
  Filled 2015-03-13: qty 2

## 2015-03-13 MED ORDER — TRAMADOL HCL 50 MG PO TABS: 50.0000 mg | ORAL_TABLET | Freq: Three times a day (TID) | ORAL | Status: DC | PRN

## 2015-03-13 MED ORDER — PROMETHAZINE HCL 25 MG/ML IJ SOLN
12.5000 mg | Freq: Once | INTRAMUSCULAR | Status: AC
  Administered 2015-03-13: 12.5 mg via INTRAVENOUS
  Filled 2015-03-13: qty 1

## 2015-03-13 MED ORDER — SODIUM CHLORIDE 0.9 % IV BOLUS (SEPSIS)
1000.0000 mL | Freq: Once | INTRAVENOUS | Status: AC
  Administered 2015-03-13: 1000 mL via INTRAVENOUS

## 2015-03-13 NOTE — ED Notes (Signed)
Pt c/o some chest pain,  ekg done and given to er md.

## 2015-03-13 NOTE — ED Notes (Signed)
Pt states she began having emesis and diarrhea 1 week ago. Yesterday she began having rectal bleeding described as bright red with darkening today.

## 2015-03-13 NOTE — ED Provider Notes (Signed)
History  By signing my name below, I, Marlowe Kays, attest that this documentation has been prepared under the direction and in the presence of Virgel Manifold, MD. Electronically Signed: Marlowe Kays, ED Scribe. 03/13/2015. 9:40 AM  Chief Complaint  Patient presents with  . Rectal Bleeding   The history is provided by the patient and medical records. No language interpreter was used.    HPI Comments:  Angel Price is a 49 y.o. female with PMHx of diverticulosis who presents to the Emergency Department complaining of vomiting and diarrhea that began approximately one week ago. She reports associated hematochezia that began yesterday. She reports a large amount of bright red blood yesterday that is now dark in color today. She reports nausea and moderate abdominal pain that began this morning, worse on the left side. She reports loss of appetite and weight loss over the past week. She has not taken anything to treat her symptoms. She denies modifying factors. She denies fever. She denies being on anticoagulant therapy. Pt reports her last colonoscopy was by Dr. Gala Romney but cannot remember when it was. She states she has rectal bleeding weekly. Pt reports allergies to Morphine and Codeine with a reaction of hives to both.  Past Medical History  Diagnosis Date  . Lupus ([place])   . 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 Hypertension   . Abscess     soft tissue  . Suicide attempt ([place])   . Blood transfusion without reported diagnosis   . Chronic abdominal pain   . Chronic wound infection of abdomen ([place])   . Adrenal mass ([place])   . Anxiety   . Depression   . GERD (gastroesophageal reflux disease)   . Sickle cell trait ([place])   . Gastroparesis Nov 2015  . Nausea and vomiting     chronic, recurrent  . Alcohol abuse   . History of Billroth II operation   . Lung nodule < 6cm on CT 04/25/2014  . Hemorrhoid     internal large  . Diverticulosis     colonoscopy 04/2014 moderat  pan colonic  . Colon polyp     colonoscopy 04/2014  . Gastritis     EGD 05/2014  . Hiatal hernia   . Schatzki's ring     patent per EGD 04/2014  . Lung nodule     CT 02/2014 needs repeat 1 month   Past Surgical History  Procedure Laterality Date  . Abdominal surgery    . Abdominal hysterectomy  2013    [place]  . Debridement of abdominal wall abscess N/A 02/08/2013    Procedure: DEBRIDEMENT OF ABDOMINAL WALL ABSCESS;  Surgeon: Jamesetta So, MD;  Location: [facility];  Service: General;  Laterality: N/A;  . Billroth ii procedure       [place], first 2000, 2005/2006.  . Colonoscopy      in [place]  . Cholecystectomy    . Colonoscopy with propofol N/A 05/20/2013    Dr.Rourk- inadequate prep, normal appearing rectum, grossly normal colon aside from pancolonic diverticula, normal terminal ileum bx= unremarkable colonic mucosa. Due for early interval 2016.   Marland Kitchen Esophagogastroduodenoscopy (egd) with propofol N/A 05/20/2013    Dr.Rourk- s/p prior gastric surgery with normal esophagus, residual gastric mucosa and patent efferent limb  . Esophageal biopsy  05/20/2013    Procedure: BIOPSIES OF ASCENDING AND SIGMOID COLON;  Surgeon: Daneil Dolin, MD;  Location: [facility];  Service: Endoscopy;;  . Wound exploration Right 06/24/2013  Procedure: exploration of traumatic wound right wrist;  Surgeon: Tennis Must, MD;  Location: [facility];  Service: Orthopedics;  Laterality: Right;  . Tendon repar Right     wrist  . Adrenal turmor removal    . Esophagogastroduodenoscopy (egd) with propofol N/A 02/03/2014    Dr. Gala Romney:  s/p hemigastrectomy with retained gastric contents. Residual gastric mucosa and efferent limb appeared normal otherwise. Query gastroparesis.   . Colonoscopy with propofol N/A 04/26/2014    [id number] ileum/one colon polyp removed/moderate pan-colonic diverticulosis/large internal hemorrhoids  . Esophagogastroduodenoscopy (egd) with propofol N/A 04/26/2014    [id number] ring/small HH/mild  non-erosive gasrtitis/normal anastomosis  . Esophageal biopsy  04/26/2014    Procedure: BIOPSIES;  Surgeon: Danie Binder, MD;  Location: [facility];  Service: Endoscopy;;  . Colonoscopy with propofol N/A 12/23/2014    Dr.Rourk- minimal internal hemorrhoids, pancolonic diverticulosis  . Esophagogastroduodenoscopy (egd) with propofol N/A 12/23/2014    Dr.Rourk- s/p prior hemigastrctomy, active oozing from anastomotic suture site, hemostasis achieved  . Agile capsule N/A 01/05/2015    Procedure: AGILE CAPSULE;  Surgeon: Daneil Dolin, MD;  [facility];  Service: Endoscopy;  Laterality: N/A;  0700   Family History  Problem Relation Age of Onset  . Brain cancer Son   . Schizophrenia Son   . Cancer Son     brain  . Breast cancer Maternal Aunt   . Bipolar disorder Maternal Aunt   . Drug abuse Maternal Aunt   . Colon cancer Maternal Grandmother     late 83s, early 11s  . Lung cancer Father   . Cancer Father     mets  . Liver disease Neg Hx   . Drug abuse Mother   . Drug abuse Sister   . Drug abuse Brother   . Bipolar disorder Paternal Grandfather   . Bipolar disorder Cousin    Social History  Substance Use Topics  . Smoking status: Current Every Day Smoker -- 0.25 packs/day for 35 years    Types: Cigarettes  . Smokeless tobacco: Never Used  . Alcohol Use: 7.2 oz/week    12 Cans of beer, 0 Standard drinks or equivalent per week     Comment: ETOH abuse. as of 04/25/14, states drinking 1-2 beers per day   OB History    No data available     Review of Systems  Constitutional: Positive for appetite change. Negative for fever.  Gastrointestinal: Positive for nausea, vomiting, abdominal pain, diarrhea and blood in stool.  All other systems reviewed and are negative.   Allergies  Codeine and Morphine and related  Home Medications   Prior to Admission medications   Medication Sig Start Date End Date Taking? Authorizing Provider  fluticasone (FLONASE) 50 MCG/ACT nasal  spray Place 1 spray into both nostrils daily.   Yes Historical Provider, MD  ibuprofen (ADVIL,MOTRIN) 200 MG tablet Take 400 mg by mouth every 6 (six) hours as needed for mild pain or moderate pain.    Yes Historical Provider, MD  lisinopril (PRINIVIL,ZESTRIL) 10 MG tablet Take 10 mg by mouth daily.   Yes Historical Provider, MD  metoCLOPramide (REGLAN) 10 MG tablet Take 1 tablet (10 mg total) by mouth 4 (four) times daily -  before meals and at bedtime. 04/06/14  Yes Lezlie Octave Black, NP  ondansetron (ZOFRAN) 4 MG tablet Take 1 tablet (4 mg total) by mouth every 6 (six) hours. 02/10/15  Yes Almyra Free Idol, PA-C  pantoprazole (PROTONIX) 40 MG tablet Take 1 tablet (  40 mg total) by mouth 2 (two) times daily before a meal. Patient taking differently: Take 40 mg by mouth daily.  09/04/14  Yes Daleen Bo, MD  sucralfate (CARAFATE) 1 G tablet Take 1 g by mouth 4 (four) times daily.   Yes Historical Provider, MD  hydrocortisone (ANUSOL-HC) 25 MG suppository Place 1 suppository (25 mg total) rectally 2 (two) times daily. Patient not taking: Reported on 03/13/2015 12/25/14   Rosita Fire, MD  ondansetron (ZOFRAN ODT) 8 MG disintegrating tablet Take 1 tablet (8 mg total) by mouth every 8 (eight) hours as needed for nausea or vomiting. Patient not taking: Reported on 03/13/2015 02/10/15   Evalee Jefferson, PA-C  promethazine (PHENERGAN) 25 MG tablet Take 1 tablet (25 mg total) by mouth every 6 (six) hours as needed for nausea or vomiting. Patient not taking: Reported on 03/13/2015 01/07/15   Kem Parkinson, PA-C   Triage Vitals: BP 147/111 mmHg  Pulse 130  Temp(Src) 98.3 F (36.8 C) (Oral)  Resp 20  Ht 5\' 3"  (1.6 m)  Wt 127 lb (57.607 kg)  BMI 22.50 kg/m2  SpO2 97% Physical Exam  Constitutional: She is oriented to person, place, and time. She appears well-developed and well-nourished. No distress.  HENT:  Head: Normocephalic and atraumatic.  Eyes: EOM are normal.  Neck: Normal range of motion.   Cardiovascular: Regular rhythm and normal heart sounds.  Tachycardia present.   Pulmonary/Chest: Effort normal and breath sounds normal.  Abdominal: Soft. She exhibits no distension. There is tenderness (left sided and suprapubic). There is guarding (voluntary).  Musculoskeletal: Normal range of motion.  Neurological: She is alert and oriented to person, place, and time.  Skin: Skin is warm and dry.  Psychiatric: She has a normal mood and affect. Judgment normal.  Nursing note and vitals reviewed.   ED Course  Procedures (including critical care time) DIAGNOSTIC STUDIES: Oxygen Saturation is 97% on RA, normal by my interpretation.   COORDINATION OF CARE: 8:24 AM- Will order labs, abdominal CT and medication for pain and nausea. Pt verbalizes understanding and agrees to plan.  Medications  sodium chloride 0.9 % bolus 1,000 mL (1,000 mLs Intravenous New Bag/Given 03/13/15 0837)  HYDROmorphone (DILAUDID) injection 0.5 mg (0.5 mg Intravenous Given 03/13/15 0837)  ondansetron (ZOFRAN) injection 4 mg (4 mg Intramuscular Given 03/13/15 0851)  iohexol (OMNIPAQUE) 300 MG/ML solution 100 mL (100 mLs Intravenous Contrast Given 03/13/15 0904)    Labs Review Labs Reviewed  COMPREHENSIVE METABOLIC PANEL - Abnormal; Notable for the following:    BUN <5 (*)    AST 115 (*)    All other components within normal limits  CBC - Abnormal; Notable for the following:    RBC 3.51 (*)    Hemoglobin 10.6 (*)    HCT 30.9 (*)    RDW 17.3 (*)    All other components within normal limits  URINALYSIS, ROUTINE W REFLEX MICROSCOPIC (NOT AT [facility])  TYPE AND SCREEN    Imaging Review Ct Abdomen Pelvis W Contrast  03/13/2015  CLINICAL DATA:  UPPER ABD PAIN THAT RADIATES TO LEFT SIDE X 1 WEEK WITH EMESIS AND DIARRHEA, RECTAL BLEEDING STARTED 12.25.2016, HX HIATAL HERNIA, SCHATZKI'S RING, LUPUS, HTN, GERD, HEMORRHOIDS, LOSS OF APPETITE, HYSTER, GB EXAM: CT ABDOMEN AND PELVIS WITH CONTRAST TECHNIQUE:  Multidetector CT imaging of the abdomen and pelvis was performed using the standard protocol following bolus administration of intravenous contrast. CONTRAST:  14mL OMNIPAQUE IOHEXOL 300 MG/ML  SOLN COMPARISON:  12/21/2014 FINDINGS: Minimal dependent atelectasis posteriorly at  the left lung base. Surgical clips around the GE junction. Surgical clips in the right adrenalectomy bed. Surgical changes of Billroth 2 procedure. Fatty liver without focal lesion. Unremarkable spleen, pancreas, kidneys. 2 cm low-attenuation left adrenal nodule, stable. Residual stomach, small bowel, and colon are nondilated. Appendix not identified. Urinary bladder physiologically distended. Bilateral pelvic phleboliths. No ascites. No free air. No adenopathy. Portal vein patent. Mild scattered aortoiliac arterial plaque without aneurysm or stenosis. In the anterior abdominal wall at the level of the emboli kiss are linear areas of streaky scarring with components extending from the abdominal wall fascia through the subcutaneous fat to the skin, stable since prior study. Regional bones unremarkable. IMPRESSION: 1. No acute abdominal process. 2. Stable intra abdominal postoperative changes and anterior abdominal wall scarring. 3. Fatty liver 4. Stable left adrenal adenoma. Electronically Signed   By: Lucrezia Europe M.D.   On: 03/13/2015 09:34   I have personally reviewed and evaluated these images and lab results as part of my medical decision-making.   EKG Interpretation None      MDM   Final diagnoses:  Rectal bleeding  Abdominal pain, unspecified abdominal location    I personally preformed the services scribed in my presence. The recorded information has been reviewed is accurate. Virgel Manifold, MD.     Virgel Manifold, MD 03/19/15 [phone]

## 2015-03-16 ENCOUNTER — Emergency Department (HOSPITAL_COMMUNITY)
Admission: EM | Admit: 2015-03-16 | Discharge: 2015-03-17 | Disposition: A | Discharge status: Home / Self Care | Payer: Medicare Other | Attending: Emergency Medicine | Admitting: Emergency Medicine

## 2015-03-16 ENCOUNTER — Encounter (HOSPITAL_COMMUNITY): Payer: Self-pay | Admitting: Emergency Medicine

## 2015-03-16 ENCOUNTER — Emergency Department (HOSPITAL_COMMUNITY): Payer: Medicare Other

## 2015-03-16 DIAGNOSIS — K219 Gastro-esophageal reflux disease without esophagitis: Secondary | ICD-10-CM | POA: Insufficient documentation

## 2015-03-16 DIAGNOSIS — K625 Hemorrhage of anus and rectum: Secondary | ICD-10-CM | POA: Diagnosis not present

## 2015-03-16 DIAGNOSIS — F329 Major depressive disorder, single episode, unspecified: Secondary | ICD-10-CM | POA: Diagnosis not present

## 2015-03-16 DIAGNOSIS — S069X0A Unspecified intracranial injury without loss of consciousness, initial encounter: Secondary | ICD-10-CM | POA: Diagnosis not present

## 2015-03-16 DIAGNOSIS — K3184 Gastroparesis: Secondary | ICD-10-CM | POA: Insufficient documentation

## 2015-03-16 DIAGNOSIS — Z862 Personal history of diseases of the blood and blood-forming organs and certain disorders involving the immune mechanism: Secondary | ICD-10-CM | POA: Diagnosis not present

## 2015-03-16 DIAGNOSIS — Z7951 Long term (current) use of inhaled steroids: Secondary | ICD-10-CM | POA: Insufficient documentation

## 2015-03-16 DIAGNOSIS — F17210 Nicotine dependence, cigarettes, uncomplicated: Secondary | ICD-10-CM | POA: Insufficient documentation

## 2015-03-16 DIAGNOSIS — Z872 Personal history of diseases of the skin and subcutaneous tissue: Secondary | ICD-10-CM | POA: Insufficient documentation

## 2015-03-16 DIAGNOSIS — I10 Essential (primary) hypertension: Secondary | ICD-10-CM | POA: Insufficient documentation

## 2015-03-16 DIAGNOSIS — F1010 Alcohol abuse, uncomplicated: Secondary | ICD-10-CM | POA: Insufficient documentation

## 2015-03-16 DIAGNOSIS — K649 Unspecified hemorrhoids: Secondary | ICD-10-CM | POA: Diagnosis not present

## 2015-03-16 DIAGNOSIS — E079 Disorder of thyroid, unspecified: Secondary | ICD-10-CM | POA: Insufficient documentation

## 2015-03-16 DIAGNOSIS — R079 Chest pain, unspecified: Secondary | ICD-10-CM | POA: Diagnosis not present

## 2015-03-16 DIAGNOSIS — R5381 Other malaise: Secondary | ICD-10-CM | POA: Diagnosis not present

## 2015-03-16 DIAGNOSIS — S299XXA Unspecified injury of thorax, initial encounter: Secondary | ICD-10-CM | POA: Diagnosis not present

## 2015-03-16 DIAGNOSIS — W19XXXA Unspecified fall, initial encounter: Secondary | ICD-10-CM

## 2015-03-16 DIAGNOSIS — Y908 Blood alcohol level of 240 mg/100 ml or more: Secondary | ICD-10-CM | POA: Diagnosis not present

## 2015-03-16 DIAGNOSIS — S3991XA Unspecified injury of abdomen, initial encounter: Secondary | ICD-10-CM | POA: Diagnosis not present

## 2015-03-16 DIAGNOSIS — Z86010 Personal history of colonic polyps: Secondary | ICD-10-CM | POA: Diagnosis not present

## 2015-03-16 DIAGNOSIS — Z79899 Other long term (current) drug therapy: Secondary | ICD-10-CM | POA: Diagnosis not present

## 2015-03-16 DIAGNOSIS — Z87738 Personal history of other specified (corrected) congenital malformations of digestive system: Secondary | ICD-10-CM | POA: Diagnosis not present

## 2015-03-16 DIAGNOSIS — G8929 Other chronic pain: Secondary | ICD-10-CM | POA: Insufficient documentation

## 2015-03-16 DIAGNOSIS — S199XXA Unspecified injury of neck, initial encounter: Secondary | ICD-10-CM | POA: Diagnosis not present

## 2015-03-16 DIAGNOSIS — R404 Transient alteration of awareness: Secondary | ICD-10-CM | POA: Diagnosis not present

## 2015-03-16 DIAGNOSIS — F419 Anxiety disorder, unspecified: Secondary | ICD-10-CM | POA: Diagnosis not present

## 2015-03-16 DIAGNOSIS — Z8739 Personal history of other diseases of the musculoskeletal system and connective tissue: Secondary | ICD-10-CM | POA: Insufficient documentation

## 2015-03-16 DIAGNOSIS — R55 Syncope and collapse: Secondary | ICD-10-CM | POA: Diagnosis not present

## 2015-03-16 LAB — COMPREHENSIVE METABOLIC PANEL
ALT: 92 U/L — ABNORMAL HIGH (ref 14–54)
ANION GAP: 13 (ref 5–15)
AST: 237 U/L — ABNORMAL HIGH (ref 15–41)
Albumin: 3.3 g/dL — ABNORMAL LOW (ref 3.5–5.0)
Alkaline Phosphatase: 95 U/L (ref 38–126)
BUN: 7 mg/dL (ref 6–20)
CALCIUM: 8.9 mg/dL (ref 8.9–10.3)
CHLORIDE: 105 mmol/L (ref 101–111)
CO2: 22 mmol/L (ref 22–32)
Creatinine, Ser: 0.6 mg/dL (ref 0.44–1.00)
GFR calc non Af Amer: >60 mL/min (ref >60)
Glucose, Bld: 90 mg/dL (ref 65–99)
Potassium: 3.9 mmol/L (ref 3.5–5.1)
SODIUM: 140 mmol/L (ref 135–145)
Total Bilirubin: 0.3 mg/dL (ref 0.3–1.2)
Total Protein: 6.6 g/dL (ref 6.5–8.1)

## 2015-03-16 LAB — CBC WITH DIFFERENTIAL/PLATELET
BASOS PCT: 0 %
Basophils Absolute: 0 10*3/uL (ref 0.0–0.1)
EOS ABS: 0 10*3/uL (ref 0.0–0.7)
Eosinophils Relative: 1 %
HCT: 31.4 % — ABNORMAL LOW (ref 36.0–46.0)
Hemoglobin: 10.9 g/dL — ABNORMAL LOW (ref 12.0–15.0)
Lymphocytes Relative: 47 %
Lymphs Abs: 1.3 10*3/uL (ref 0.7–4.0)
MCH: 30.5 pg (ref 26.0–34.0)
MCHC: 34.7 g/dL (ref 30.0–36.0)
MCV: 88 fL (ref 78.0–100.0)
MONO ABS: 0.5 10*3/uL (ref 0.1–1.0)
MONOS PCT: 17 %
Neutro Abs: 1 10*3/uL — ABNORMAL LOW (ref 1.7–7.7)
Neutrophils Relative %: 35 %
PLATELETS: 256 10*3/uL (ref 150–400)
RBC: 3.57 MIL/uL — ABNORMAL LOW (ref 3.87–5.11)
RDW: 17 % — AB (ref 11.5–15.5)
WBC: 2.8 10*3/uL — ABNORMAL LOW (ref 4.0–10.5)

## 2015-03-16 LAB — TROPONIN I

## 2015-03-16 LAB — ETHANOL: Alcohol, Ethyl (B): 247 mg/dL — ABNORMAL HIGH (ref <5)

## 2015-03-16 MED ORDER — SODIUM CHLORIDE 0.9 % IV BOLUS (SEPSIS)
1000.0000 mL | Freq: Once | INTRAVENOUS | Status: AC
  Administered 2015-03-16: 1000 mL via INTRAVENOUS

## 2015-03-16 MED ORDER — PANTOPRAZOLE SODIUM 40 MG IV SOLR
40.0000 mg | Freq: Once | INTRAVENOUS | Status: AC
  Administered 2015-03-16: 40 mg via INTRAVENOUS
  Filled 2015-03-16: qty 40

## 2015-03-16 NOTE — ED Notes (Signed)
Called to room by friend. Pt unresponsive -- eye lids flickering and then pt came around, however, staring off, not answering questions, No tremors or seizure activity noted . Dr Roderic Palau into room - lifted arms up above pt's head - arm fell towards back of head and away from face . Repeated this movement x3 , each time with same result. MD left room and pt became more responsive

## 2015-03-16 NOTE — ED Notes (Signed)
Per EMS report  Pt was in bathroom when friend heard a crash - pt on floor --female friend initiated CPR ( unknown if pt was pulseless) - upon arrival of ER pt had strong pule and spontaneous resp- non responsive to painful stim or too ammonia. Bruising noted to chest - when pt taken to EMS - became responsive. Stated that she has been having rectal bleeding for past week- , today didn't take any of her meds and drank 4-12 oz bottles of beer.

## 2015-03-16 NOTE — ED Provider Notes (Signed)
CSN: [id number]     Arrival date & time 03/16/15  2021 History   By signing my name below, I, Forrestine Him, attest that this documentation has been prepared under the direction and in the presence of Milton Ferguson, MD.  Electronically Signed: Forrestine Him, ED Scribe. 03/16/2015. 8:56 PM.   Chief Complaint  Patient presents with  . Loss of Consciousness   Patient is a 49 y.o. female presenting with syncope. The history is provided by the patient. No language interpreter was used.  Loss of Consciousness Episode history:  Single Most recent episode:  Today Duration:  10 minutes Timing:  Constant Progression:  Resolved Chronicity:  New Witnessed: yes   Relieved by:  None tried Worsened by:  Nothing tried Ineffective treatments:  None tried Associated symptoms: chest pain   Associated symptoms: no confusion, no fever, no headaches, no nausea, no shortness of breath and no vomiting     HPI Comments: Angel Price brought in by EMS is a 49 y.o. female who presents to the Emergency Department here after an episode of loss of consciousness this evening. Family states he heard a loud 'thump" and noticed pt fell to the her knees after "her legs gave out on her". He then carried her to the bed and states she passed out. No head trauma. Family states he feels she was down for approximately 10 minutes and awoke when EMS arrived. CPR performed prior to EMS arrival resulting in some reported chest discomfort. No previous history of syncopal episodes. Ms. Savitz also reports intermittent, ongoing rectal bleeding x 1 week. Denies any hematemesis. She states "i just dont feel well". Pt denies any recent fever, chills, nausea, or vomiting.  PCP: Rosita Fire, MD    Past Medical History  Diagnosis Date  . Lupus ([place])   . 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 Hypertension   . Abscess     soft tissue  . Suicide attempt ([place])   . Blood transfusion without reported diagnosis   .  Chronic abdominal pain   . Chronic wound infection of abdomen ([place])   . Adrenal mass ([place])   . Anxiety   . Depression   . GERD (gastroesophageal reflux disease)   . Sickle cell trait ([place])   . Gastroparesis Nov 2015  . Nausea and vomiting     chronic, recurrent  . Alcohol abuse   . History of Billroth II operation   . Lung nodule < 6cm on CT 04/25/2014  . Hemorrhoid     internal large  . Diverticulosis     colonoscopy 04/2014 moderat pan colonic  . Colon polyp     colonoscopy 04/2014  . Gastritis     EGD 05/2014  . Hiatal hernia   . Schatzki's ring     patent per EGD 04/2014  . Lung nodule     CT 02/2014 needs repeat 1 month   Past Surgical History  Procedure Laterality Date  . Abdominal surgery    . Abdominal hysterectomy  2013    [place]  . Debridement of abdominal wall abscess N/A 02/08/2013    Procedure: DEBRIDEMENT OF ABDOMINAL WALL ABSCESS;  Surgeon: Jamesetta So, MD;  Location: [facility];  Service: General;  Laterality: N/A;  . Billroth ii procedure       [place], first 2000, 2005/2006.  . Colonoscopy      in [place]  . Cholecystectomy    . Colonoscopy with propofol N/A 05/20/2013    Dr.Rourk- inadequate  prep, normal appearing rectum, grossly normal colon aside from pancolonic diverticula, normal terminal ileum bx= unremarkable colonic mucosa. Due for early interval 2016.   Marland Kitchen Esophagogastroduodenoscopy (egd) with propofol N/A 05/20/2013    Dr.Rourk- s/p prior gastric surgery with normal esophagus, residual gastric mucosa and patent efferent limb  . Esophageal biopsy  05/20/2013    Procedure: BIOPSIES OF ASCENDING AND SIGMOID COLON;  Surgeon: Daneil Dolin, MD;  Location: [facility];  Service: Endoscopy;;  . Wound exploration Right 06/24/2013    Procedure: exploration of traumatic wound right wrist;  Surgeon: Tennis Must, MD;  Location: [facility];  Service: Orthopedics;  Laterality: Right;  . Tendon repar Right     wrist  . Adrenal turmor removal    . Esophagogastroduodenoscopy  (egd) with propofol N/A 02/03/2014    Dr. Gala Romney:  s/p hemigastrectomy with retained gastric contents. Residual gastric mucosa and efferent limb appeared normal otherwise. Query gastroparesis.   . Colonoscopy with propofol N/A 04/26/2014    [id number] ileum/one colon polyp removed/moderate pan-colonic diverticulosis/large internal hemorrhoids  . Esophagogastroduodenoscopy (egd) with propofol N/A 04/26/2014    [id number] ring/small HH/mild non-erosive gasrtitis/normal anastomosis  . Esophageal biopsy  04/26/2014    Procedure: BIOPSIES;  Surgeon: Danie Binder, MD;  Location: [facility];  Service: Endoscopy;;  . Colonoscopy with propofol N/A 12/23/2014    Dr.Rourk- minimal internal hemorrhoids, pancolonic diverticulosis  . Esophagogastroduodenoscopy (egd) with propofol N/A 12/23/2014    Dr.Rourk- s/p prior hemigastrctomy, active oozing from anastomotic suture site, hemostasis achieved  . Agile capsule N/A 01/05/2015    Procedure: AGILE CAPSULE;  Surgeon: Daneil Dolin, MD;  [facility];  Service: Endoscopy;  Laterality: N/A;  0700   Family History  Problem Relation Age of Onset  . Brain cancer Son   . Schizophrenia Son   . Cancer Son     brain  . Breast cancer Maternal Aunt   . Bipolar disorder Maternal Aunt   . Drug abuse Maternal Aunt   . Colon cancer Maternal Grandmother     late 19s, early 8s  . Lung cancer Father   . Cancer Father     mets  . Liver disease Neg Hx   . Drug abuse Mother   . Drug abuse Sister   . Drug abuse Brother   . Bipolar disorder Paternal Grandfather   . Bipolar disorder Cousin    Social History  Substance Use Topics  . Smoking status: Current Every Day Smoker -- 0.25 packs/day for 35 years    Types: Cigarettes  . Smokeless tobacco: Never Used  . Alcohol Use: 7.2 oz/week    12 Cans of beer, 0 Standard drinks or equivalent per week     Comment: ETOH abuse. as of 04/25/14, states drinking 1-2 beers per day   OB History    No data available      Review of Systems  Constitutional: Negative for fever and chills.  HENT: Negative for congestion, rhinorrhea and sore throat.   Eyes: Negative for visual disturbance.  Respiratory: Negative for cough and shortness of breath.   Cardiovascular: Positive for chest pain and syncope.  Gastrointestinal: Positive for anal bleeding. Negative for nausea, vomiting, abdominal pain and diarrhea.  Genitourinary: Negative for dysuria.  Musculoskeletal: Negative for back pain, joint swelling and neck pain.  Skin: Negative for rash.  Neurological: Positive for syncope. Negative for headaches.  Psychiatric/Behavioral: Negative for confusion.      Allergies  Codeine and Morphine and related  Home Medications  Prior to Admission medications   Medication Sig Start Date End Date Taking? Authorizing Provider  fluticasone (FLONASE) 50 MCG/ACT nasal spray Place 1 spray into both nostrils daily.    Historical Provider, MD  hydrocortisone (ANUSOL-HC) 25 MG suppository Place 1 suppository (25 mg total) rectally 2 (two) times daily. Patient not taking: Reported on 03/13/2015 12/25/14   Rosita Fire, MD  ibuprofen (ADVIL,MOTRIN) 200 MG tablet Take 400 mg by mouth every 6 (six) hours as needed for mild pain or moderate pain.     Historical Provider, MD  lisinopril (PRINIVIL,ZESTRIL) 10 MG tablet Take 10 mg by mouth daily.    Historical Provider, MD  metoCLOPramide (REGLAN) 10 MG tablet Take 1 tablet (10 mg total) by mouth 4 (four) times daily -  before meals and at bedtime. 04/06/14   Radene Gunning, NP  ondansetron (ZOFRAN ODT) 8 MG disintegrating tablet Take 1 tablet (8 mg total) by mouth every 8 (eight) hours as needed for nausea or vomiting. Patient not taking: Reported on 03/13/2015 02/10/15   Evalee Jefferson, PA-C  ondansetron (ZOFRAN) 4 MG tablet Take 1 tablet (4 mg total) by mouth every 6 (six) hours. 02/10/15   Evalee Jefferson, PA-C  pantoprazole (PROTONIX) 40 MG tablet Take 1 tablet (40 mg total) by mouth 2  (two) times daily before a meal. Patient taking differently: Take 40 mg by mouth daily.  09/04/14   Daleen Bo, MD  promethazine (PHENERGAN) 25 MG tablet Take 1 tablet (25 mg total) by mouth every 6 (six) hours as needed for nausea or vomiting. Patient not taking: Reported on 03/13/2015 01/07/15   Tammy Triplett, PA-C  sucralfate (CARAFATE) 1 G tablet Take 1 g by mouth 4 (four) times daily.    Historical Provider, MD  traMADol (ULTRAM) 50 MG tablet Take 1 tablet (50 mg total) by mouth 3 (three) times daily as needed. 03/13/15   Virgel Manifold, MD   Triage Vitals: BP 115/90 mmHg  Pulse 101  Temp(Src) 97.6 F (36.4 C) (Oral)  Resp 25  Ht 5\' 3"  (1.6 m)  Wt 125 lb (56.7 kg)  BMI 22.15 kg/m2  SpO2 95%   Physical Exam  Constitutional: She is oriented to person, place, and time. She appears well-developed.  HENT:  Head: Normocephalic.  Eyes: Conjunctivae and EOM are normal. No scleral icterus.  Neck: Neck supple. No thyromegaly present.  Cardiovascular: Normal rate and regular rhythm.  Exam reveals no gallop and no friction rub.   No murmur heard. Pulmonary/Chest: No stridor. She has no wheezes. She has no rales. She exhibits tenderness.  Mild tenderness to sternum  Abdominal: She exhibits no distension. There is tenderness. There is no rebound.  Mild tenderness to epigastric area  Musculoskeletal: Normal range of motion. She exhibits no edema.  Lymphadenopathy:    She has no cervical adenopathy.  Neurological: She is oriented to person, place, and time. She exhibits normal muscle tone. Coordination normal.  Skin: No rash noted. No erythema.  Psychiatric: She has a normal mood and affect. Her behavior is normal.    ED Course  Procedures (including critical care time)  DIAGNOSTIC STUDIES: Oxygen Saturation is 95% on RA, adequate by my interpretation.    COORDINATION OF CARE: 8:47 PM- Will give fluids and Protonix. Will order CT head without contrast, CT cervical spine without  contrast, CT abdomen without contrast, CT chest without contrast, Troponin I, CBC, CMP, Ethanol, and EKG. Discussed treatment plan with pt at bedside and pt agreed to plan.  Labs Review Labs Reviewed - No data to display  Imaging Review No results found. I have personally reviewed and evaluated these images and lab results as part of my medical decision-making.   EKG Interpretation None      MDM   Final diagnoses:  None    Patient with syncopal episode at home. CT scan of head neck chest and abdomen were negative labs were unremarkable except for the alcohol level of 247. Patient was not orthostatic. Patient will be hydrated up with fluids and will be told to follow-up with her family  The chart was scribed for me under my direct supervision.  I personally performed the history, physical, and medical decision making and all procedures in the evaluation of this patient..  doctor this week and stop drinking  Milton Ferguson, MD 03/16/15 [phone]

## 2015-03-16 NOTE — Discharge Instructions (Signed)
Stop drinking alcohol. Drink plenty of other fluids this weekend. Follow-up with your doctor this week

## 2015-03-29 ENCOUNTER — Emergency Department (HOSPITAL_COMMUNITY): Payer: Medicare Other

## 2015-03-29 ENCOUNTER — Encounter (HOSPITAL_COMMUNITY): Payer: Self-pay

## 2015-03-29 ENCOUNTER — Emergency Department (HOSPITAL_COMMUNITY)
Admission: EM | Admit: 2015-03-29 | Discharge: 2015-03-29 | Disposition: A | Discharge status: Home / Self Care | Payer: Medicare Other | Attending: Emergency Medicine | Admitting: Emergency Medicine

## 2015-03-29 DIAGNOSIS — R262 Difficulty in walking, not elsewhere classified: Secondary | ICD-10-CM | POA: Diagnosis not present

## 2015-03-29 DIAGNOSIS — R0789 Other chest pain: Secondary | ICD-10-CM | POA: Diagnosis not present

## 2015-03-29 DIAGNOSIS — Z79899 Other long term (current) drug therapy: Secondary | ICD-10-CM | POA: Diagnosis not present

## 2015-03-29 DIAGNOSIS — F1010 Alcohol abuse, uncomplicated: Secondary | ICD-10-CM | POA: Diagnosis not present

## 2015-03-29 DIAGNOSIS — Z872 Personal history of diseases of the skin and subcutaneous tissue: Secondary | ICD-10-CM | POA: Insufficient documentation

## 2015-03-29 DIAGNOSIS — Z862 Personal history of diseases of the blood and blood-forming organs and certain disorders involving the immune mechanism: Secondary | ICD-10-CM | POA: Insufficient documentation

## 2015-03-29 DIAGNOSIS — Z980 Intestinal bypass and anastomosis status: Secondary | ICD-10-CM | POA: Insufficient documentation

## 2015-03-29 DIAGNOSIS — K219 Gastro-esophageal reflux disease without esophagitis: Secondary | ICD-10-CM | POA: Diagnosis not present

## 2015-03-29 DIAGNOSIS — Z8639 Personal history of other endocrine, nutritional and metabolic disease: Secondary | ICD-10-CM | POA: Insufficient documentation

## 2015-03-29 DIAGNOSIS — R55 Syncope and collapse: Secondary | ICD-10-CM | POA: Diagnosis not present

## 2015-03-29 DIAGNOSIS — Z7951 Long term (current) use of inhaled steroids: Secondary | ICD-10-CM | POA: Insufficient documentation

## 2015-03-29 DIAGNOSIS — R079 Chest pain, unspecified: Secondary | ICD-10-CM | POA: Diagnosis not present

## 2015-03-29 DIAGNOSIS — F17210 Nicotine dependence, cigarettes, uncomplicated: Secondary | ICD-10-CM | POA: Diagnosis not present

## 2015-03-29 DIAGNOSIS — F329 Major depressive disorder, single episode, unspecified: Secondary | ICD-10-CM | POA: Diagnosis not present

## 2015-03-29 DIAGNOSIS — I10 Essential (primary) hypertension: Secondary | ICD-10-CM | POA: Insufficient documentation

## 2015-03-29 DIAGNOSIS — Z86010 Personal history of colonic polyps: Secondary | ICD-10-CM | POA: Insufficient documentation

## 2015-03-29 DIAGNOSIS — F419 Anxiety disorder, unspecified: Secondary | ICD-10-CM | POA: Insufficient documentation

## 2015-03-29 DIAGNOSIS — Z9049 Acquired absence of other specified parts of digestive tract: Secondary | ICD-10-CM | POA: Insufficient documentation

## 2015-03-29 DIAGNOSIS — Y907 Blood alcohol level of 200-239 mg/100 ml: Secondary | ICD-10-CM | POA: Diagnosis not present

## 2015-03-29 DIAGNOSIS — G8929 Other chronic pain: Secondary | ICD-10-CM | POA: Insufficient documentation

## 2015-03-29 DIAGNOSIS — F1020 Alcohol dependence, uncomplicated: Secondary | ICD-10-CM | POA: Diagnosis not present

## 2015-03-29 LAB — CBC WITH DIFFERENTIAL/PLATELET
BASOS ABS: 0 10*3/uL (ref 0.0–0.1)
BASOS PCT: 0 %
EOS PCT: 0 %
Eosinophils Absolute: 0 10*3/uL (ref 0.0–0.7)
HCT: 27.9 % — ABNORMAL LOW (ref 36.0–46.0)
Hemoglobin: 9.6 g/dL — ABNORMAL LOW (ref 12.0–15.0)
Lymphocytes Relative: 37 %
Lymphs Abs: 1.1 10*3/uL (ref 0.7–4.0)
MCH: 30.7 pg (ref 26.0–34.0)
MCHC: 34.4 g/dL (ref 30.0–36.0)
MCV: 89.1 fL (ref 78.0–100.0)
MONO ABS: 0.4 10*3/uL (ref 0.1–1.0)
Monocytes Relative: 12 %
NEUTROS ABS: 1.6 10*3/uL — AB (ref 1.7–7.7)
Neutrophils Relative %: 51 %
PLATELETS: 68 10*3/uL — AB (ref 150–400)
RBC: 3.13 MIL/uL — AB (ref 3.87–5.11)
RDW: 17.6 % — AB (ref 11.5–15.5)
WBC: 3.1 10*3/uL — AB (ref 4.0–10.5)

## 2015-03-29 LAB — ETHANOL: Alcohol, Ethyl (B): 212 mg/dL — ABNORMAL HIGH (ref <5)

## 2015-03-29 LAB — I-STAT CHEM 8, ED
BUN: <3 mg/dL — ABNORMAL LOW (ref 6–20)
Calcium, Ion: 1.02 mmol/L — ABNORMAL LOW (ref 1.12–1.23)
Chloride: 102 mmol/L (ref 101–111)
Creatinine, Ser: 0.7 mg/dL (ref 0.44–1.00)
Glucose, Bld: 64 mg/dL — ABNORMAL LOW (ref 65–99)
HEMATOCRIT: 38 % (ref 36.0–46.0)
HEMOGLOBIN: 12.9 g/dL (ref 12.0–15.0)
Potassium: 4.1 mmol/L (ref 3.5–5.1)
SODIUM: 134 mmol/L — AB (ref 135–145)
TCO2: 21 mmol/L (ref 0–100)

## 2015-03-29 LAB — I-STAT TROPONIN, ED: Troponin i, poc: 0 ng/mL (ref 0.00–0.08)

## 2015-03-29 MED ORDER — SODIUM CHLORIDE 0.9 % IV BOLUS (SEPSIS): 2000.0000 mL | Freq: Once | INTRAVENOUS | Status: DC

## 2015-03-29 MED ORDER — CHLORDIAZEPOXIDE HCL 25 MG PO CAPS: ORAL_CAPSULE | ORAL | Status: DC

## 2015-03-29 NOTE — ED Provider Notes (Signed)
CSN: [id number]     Arrival date & time 03/29/15  1255 History   First MD Initiated Contact with Patient 03/29/15 1300     Chief Complaint  Patient presents with  . Chest Pain     (Consider location/radiation/quality/duration/timing/severity/associated sxs/prior Treatment) Patient is a 50 y.o. female presenting with chest pain. The history is provided by the patient (Patient has history of EtOH abuse. Patient had a syncopal episode today and is having some chest discomfort).  Chest Pain Pain location:  L chest Pain quality: aching   Pain radiates to:  Does not radiate Pain radiates to the back: no   Pain severity:  Mild Onset quality:  Sudden Timing:  Intermittent Progression:  Waxing and waning Chronicity:  New Context: not breathing   Associated symptoms: no abdominal pain, no back pain, no cough, no fatigue and no headache     Past Medical History  Diagnosis Date  . Lupus ([place])   . 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 Hypertension   . Abscess     soft tissue  . Suicide attempt ([place])   . Blood transfusion without reported diagnosis   . Chronic abdominal pain   . Chronic wound infection of abdomen ([place])   . Adrenal mass ([place])   . Anxiety   . Depression   . GERD (gastroesophageal reflux disease)   . Sickle cell trait ([place])   . Gastroparesis Nov 2015  . Nausea and vomiting     chronic, recurrent  . Alcohol abuse   . History of Billroth II operation   . Lung nodule < 6cm on CT 04/25/2014  . Hemorrhoid     internal large  . Diverticulosis     colonoscopy 04/2014 moderat pan colonic  . Colon polyp     colonoscopy 04/2014  . Gastritis     EGD 05/2014  . Hiatal hernia   . Schatzki's ring     patent per EGD 04/2014  . Lung nodule     CT 02/2014 needs repeat 1 month   Past Surgical History  Procedure Laterality Date  . Abdominal surgery    . Abdominal hysterectomy  2013    [place]  . Debridement of abdominal wall abscess N/A 02/08/2013    Procedure:  DEBRIDEMENT OF ABDOMINAL WALL ABSCESS;  Surgeon: Jamesetta So, MD;  Location: [facility];  Service: General;  Laterality: N/A;  . Billroth ii procedure       [place], first 2000, 2005/2006.  . Colonoscopy      in [place]  . Cholecystectomy    . Colonoscopy with propofol N/A 05/20/2013    Dr.Rourk- inadequate prep, normal appearing rectum, grossly normal colon aside from pancolonic diverticula, normal terminal ileum bx= unremarkable colonic mucosa. Due for early interval 2016.   Marland Kitchen Esophagogastroduodenoscopy (egd) with propofol N/A 05/20/2013    Dr.Rourk- s/p prior gastric surgery with normal esophagus, residual gastric mucosa and patent efferent limb  . Esophageal biopsy  05/20/2013    Procedure: BIOPSIES OF ASCENDING AND SIGMOID COLON;  Surgeon: Daneil Dolin, MD;  Location: [facility];  Service: Endoscopy;;  . Wound exploration Right 06/24/2013    Procedure: exploration of traumatic wound right wrist;  Surgeon: Tennis Must, MD;  Location: [facility];  Service: Orthopedics;  Laterality: Right;  . Tendon repar Right     wrist  . Adrenal turmor removal    . Esophagogastroduodenoscopy (egd) with propofol N/A 02/03/2014    Dr. Gala Romney:  s/p hemigastrectomy with retained  gastric contents. Residual gastric mucosa and efferent limb appeared normal otherwise. Query gastroparesis.   . Colonoscopy with propofol N/A 04/26/2014    [id number] ileum/one colon polyp removed/moderate pan-colonic diverticulosis/large internal hemorrhoids  . Esophagogastroduodenoscopy (egd) with propofol N/A 04/26/2014    [id number] ring/small HH/mild non-erosive gasrtitis/normal anastomosis  . Esophageal biopsy  04/26/2014    Procedure: BIOPSIES;  Surgeon: Danie Binder, MD;  Location: [facility];  Service: Endoscopy;;  . Colonoscopy with propofol N/A 12/23/2014    Dr.Rourk- minimal internal hemorrhoids, pancolonic diverticulosis  . Esophagogastroduodenoscopy (egd) with propofol N/A 12/23/2014    Dr.Rourk- s/p prior hemigastrctomy, active  oozing from anastomotic suture site, hemostasis achieved  . Agile capsule N/A 01/05/2015    Procedure: AGILE CAPSULE;  Surgeon: Daneil Dolin, MD;  Location: [facility];  Service: Endoscopy;  Laterality: N/A;  0700   Family History  Problem Relation Age of Onset  . Brain cancer Son   . Schizophrenia Son   . Cancer Son     brain  . Breast cancer Maternal Aunt   . Bipolar disorder Maternal Aunt   . Drug abuse Maternal Aunt   . Colon cancer Maternal Grandmother     late 45s, early 73s  . Lung cancer Father   . Cancer Father     mets  . Liver disease Neg Hx   . Drug abuse Mother   . Drug abuse Sister   . Drug abuse Brother   . Bipolar disorder Paternal Grandfather   . Bipolar disorder Cousin    Social History  Substance Use Topics  . Smoking status: Current Every Day Smoker -- 0.25 packs/day for 35 years    Types: Cigarettes  . Smokeless tobacco: Never Used  . Alcohol Use: 7.2 oz/week    12 Cans of beer, 0 Standard drinks or equivalent per week     Comment: ETOH abuse. as of 04/25/14, states drinking 1-2 beers per day   OB History    No data available     Review of Systems  Constitutional: Negative for appetite change and fatigue.  HENT: Negative for congestion, ear discharge and sinus pressure.   Eyes: Negative for discharge.  Respiratory: Negative for cough.   Cardiovascular: Positive for chest pain.  Gastrointestinal: Negative for abdominal pain and diarrhea.  Genitourinary: Negative for frequency and hematuria.  Musculoskeletal: Negative for back pain.  Skin: Negative for rash.  Neurological: Negative for seizures and headaches.  Psychiatric/Behavioral: Negative for hallucinations.      Allergies  Codeine and Morphine and related  Home Medications   Prior to Admission medications   Medication Sig Start Date End Date Taking? Authorizing Provider  fluticasone (FLONASE) 50 MCG/ACT nasal spray Place 1 spray into both nostrils daily.    Historical Provider,  MD  hydrocortisone (ANUSOL-HC) 25 MG suppository Place 1 suppository (25 mg total) rectally 2 (two) times daily. Patient not taking: Reported on 03/13/2015 12/25/14   Rosita Fire, MD  ibuprofen (ADVIL,MOTRIN) 200 MG tablet Take 400 mg by mouth every 6 (six) hours as needed for mild pain or moderate pain.     Historical Provider, MD  lisinopril (PRINIVIL,ZESTRIL) 10 MG tablet Take 10 mg by mouth daily.    Historical Provider, MD  metoCLOPramide (REGLAN) 10 MG tablet Take 1 tablet (10 mg total) by mouth 4 (four) times daily -  before meals and at bedtime. 04/06/14   Radene Gunning, NP  ondansetron (ZOFRAN ODT) 8 MG disintegrating tablet Take 1 tablet (8 mg total) by  mouth every 8 (eight) hours as needed for nausea or vomiting. Patient not taking: Reported on 03/13/2015 02/10/15   Evalee Jefferson, PA-C  ondansetron (ZOFRAN) 4 MG tablet Take 1 tablet (4 mg total) by mouth every 6 (six) hours. 02/10/15   Evalee Jefferson, PA-C  pantoprazole (PROTONIX) 40 MG tablet Take 1 tablet (40 mg total) by mouth 2 (two) times daily before a meal. Patient taking differently: Take 40 mg by mouth daily.  09/04/14   Daleen Bo, MD  promethazine (PHENERGAN) 25 MG tablet Take 1 tablet (25 mg total) by mouth every 6 (six) hours as needed for nausea or vomiting. Patient not taking: Reported on 03/13/2015 01/07/15   Tammy Triplett, PA-C  sucralfate (CARAFATE) 1 G tablet Take 1 g by mouth 4 (four) times daily.    Historical Provider, MD  traMADol (ULTRAM) 50 MG tablet Take 1 tablet (50 mg total) by mouth 3 (three) times daily as needed. 03/13/15   Virgel Manifold, MD   BP 120/72 mmHg  Pulse 67  Temp(Src) 98.5 F (36.9 C) (Oral)  Resp 14  Ht 5\' 3"  (1.6 m)  SpO2 100% Physical Exam  Constitutional: She is oriented to person, place, and time. She appears well-developed.  HENT:  Head: Normocephalic.  Eyes: Conjunctivae and EOM are normal. No scleral icterus.  Neck: Neck supple. No thyromegaly present.  Cardiovascular: Normal rate  and regular rhythm.  Exam reveals no gallop and no friction rub.   No murmur heard. Pulmonary/Chest: No stridor. She has no wheezes. She has no rales. She exhibits tenderness.  Abdominal: She exhibits no distension. There is no tenderness. There is no rebound.  Musculoskeletal: Normal range of motion. She exhibits no edema.  Lymphadenopathy:    She has no cervical adenopathy.  Neurological: She is oriented to person, place, and time. She exhibits normal muscle tone. Coordination normal.  Skin: No rash noted. No erythema.  Psychiatric: She has a normal mood and affect. Her behavior is normal.    ED Course  Procedures (including critical care time) Labs Review Labs Reviewed  CBC WITH DIFFERENTIAL/PLATELET - Abnormal; Notable for the following:    WBC 3.1 (*)    RBC 3.13 (*)    Hemoglobin 9.6 (*)    HCT 27.9 (*)    RDW 17.6 (*)    Platelets 68 (*)    Neutro Abs 1.6 (*)    All other components within normal limits  ETHANOL - Abnormal; Notable for the following:    Alcohol, Ethyl (B) 212 (*)    All other components within normal limits  I-STAT CHEM 8, ED - Abnormal; Notable for the following:    Sodium 134 (*)    BUN <3 (*)    Glucose, Bld 64 (*)    Calcium, Ion 1.02 (*)    All other components within normal limits  I-STAT TROPOININ, ED    Imaging Review Ct Head Wo Contrast  03/29/2015  CLINICAL DATA:  The drinking alcohol last Thursday. Started having chest pains, shortness breath and vomiting. EXAM: CT HEAD WITHOUT CONTRAST TECHNIQUE: Contiguous axial images were obtained from the base of the skull through the vertex without intravenous contrast. COMPARISON:  03/16/2015 FINDINGS: There is no evidence of mass effect, midline shift or extra-axial fluid collections. There is no evidence of a space-occupying lesion or intracranial hemorrhage. There is no evidence of a cortical-based area of acute infarction. The ventricles and sulci are appropriate for the patient's age. The basal  cisterns are patent. Visualized portions of the  orbits are unremarkable. The visualized portions of the paranasal sinuses and mastoid air cells are unremarkable. The osseous structures are unremarkable. IMPRESSION: No acute intracranial pathology. Electronically Signed   By: Kathreen Devoid   On: 03/29/2015 14:40   Dg Abd Acute W/chest  03/29/2015  CLINICAL DATA:  Syncope, intermittent chest pain EXAM: DG ABDOMEN ACUTE W/ 1V CHEST COMPARISON:  CT abdomen 03/16/2015 FINDINGS: There is a moderate amount of stool in the colon. There is no evidence of dilated bowel loops or free intraperitoneal air. No radiopaque calculi or other significant radiographic abnormality is seen. Heart size and mediastinal contours are within normal limits. Both lungs are clear. IMPRESSION: Negative abdominal radiographs.  No acute cardiopulmonary disease. Electronically Signed   By: Kathreen Devoid   On: 03/29/2015 14:58   I have personally reviewed and evaluated these images and lab results as part of my medical decision-making.   EKG Interpretation   Date/Time:  Wednesday March 29 2015 13:10:02 EST Ventricular Rate:  99 PR Interval:  133 QRS Duration: 83 QT Interval:  368 QTC Calculation: 472 R Axis:   66 Text Interpretation:  Sinus rhythm Confirmed by Annalyse Langlais  MD, Vence Lalor [phone])  on 03/29/2015 1:22:35 PM      MDM   Final diagnoses:  ETOH abuse    Labs remarkable  for alcohol level 212 And anemia with hemoglobin of 9.6 platelets 68.   Suspect syncope is related to the elevated alcohol patient will be put on a Librium taper and will follow-up with her M.D.  Milton Ferguson, MD 03/29/15 1535

## 2015-03-29 NOTE — ED Notes (Signed)
At Dr. Ellsworth Lennox request patient was given a Coke and some graham crackers.

## 2015-03-29 NOTE — ED Notes (Signed)
CBG 97. Patient states that she was drinking about a 12 pack per day, until last Thursday.

## 2015-03-29 NOTE — Discharge Instructions (Signed)
Stop drinking alcohol and then start taking the librium and see dr Legrand Rams this week.

## 2015-03-29 NOTE — ED Notes (Signed)
Quit drinking alcohol last Thursday (beer). Started having chest pain that is intermittent, shortness of breath, vomiting. Unable to eat. Feet are swelling and split, having trouble walking.  Started having some weakness and facial droop this morning, unknown time. Left grip was weak per EMS. Did drink some alcohol last night, unknown amount.

## 2015-04-17 IMAGING — CR DG ABDOMEN ACUTE W/ 1V CHEST
3 series · 3 of 3 positions shown · non-contrast
Comparison: CT abdomen/ pelvis 12/08/2013

CLINICAL DATA: Abdominal pain and distention for 5 days

EXAM:
ACUTE ABDOMEN SERIES (ABDOMEN 2 VIEW & CHEST 1 VIEW)

[view not recorded (1 of 3)]
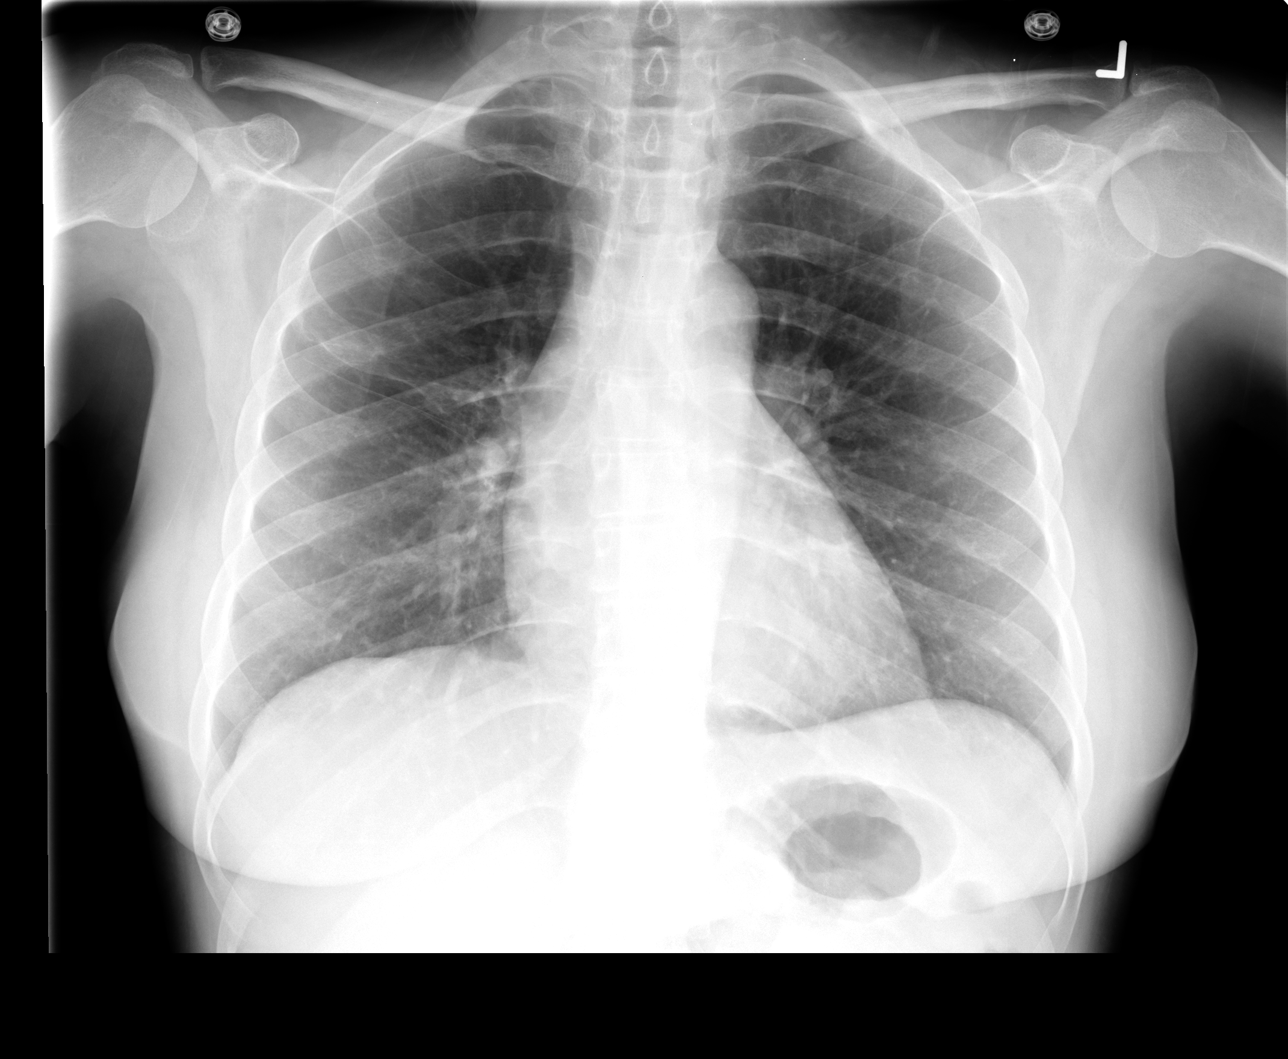

[view not recorded (2 of 3)]
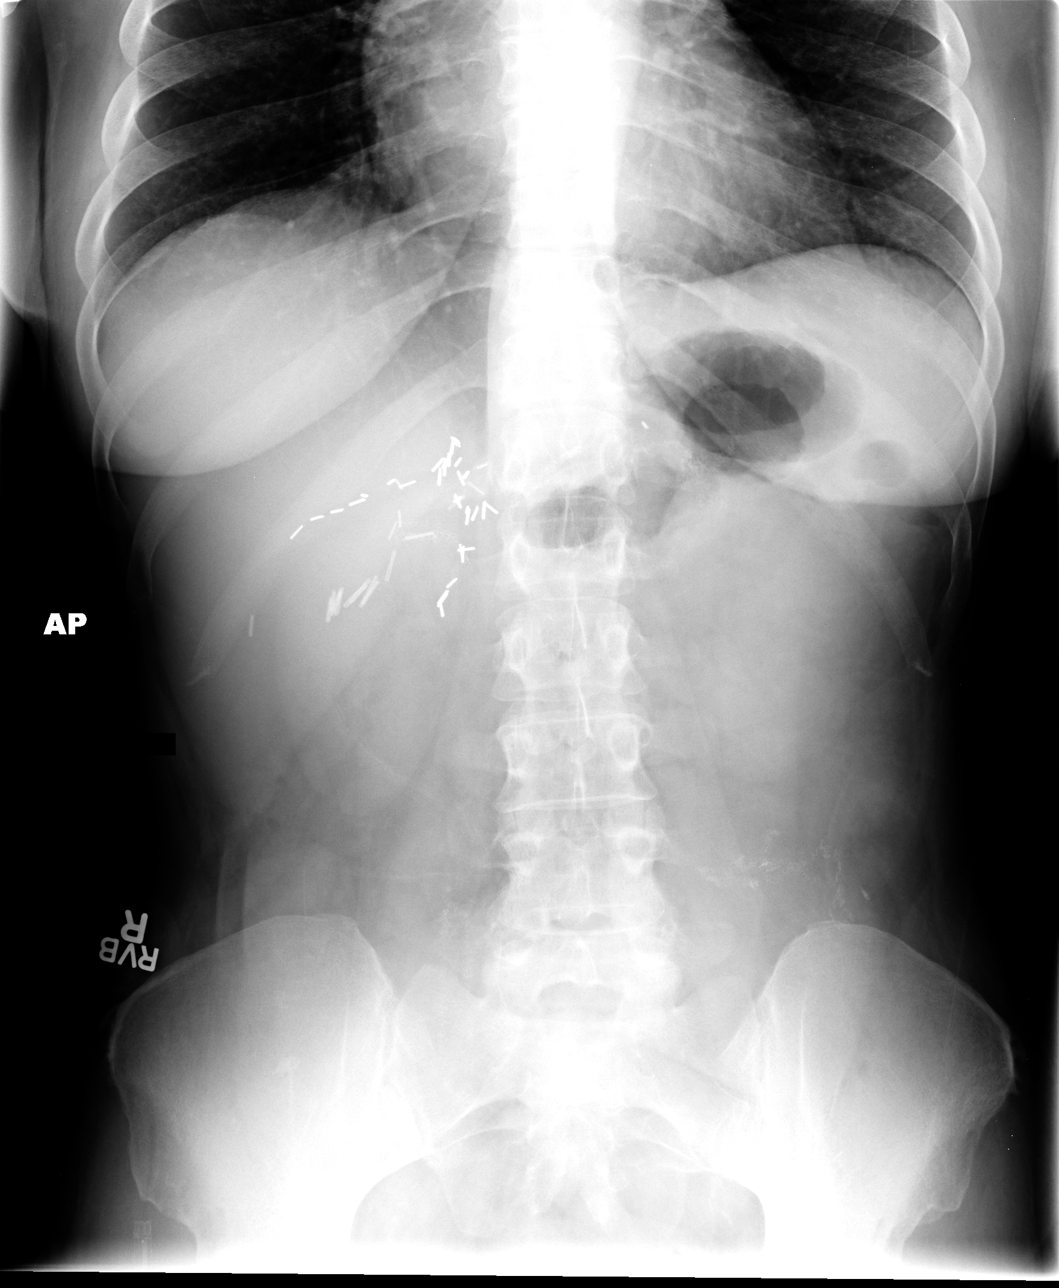

[view not recorded (3 of 3)]
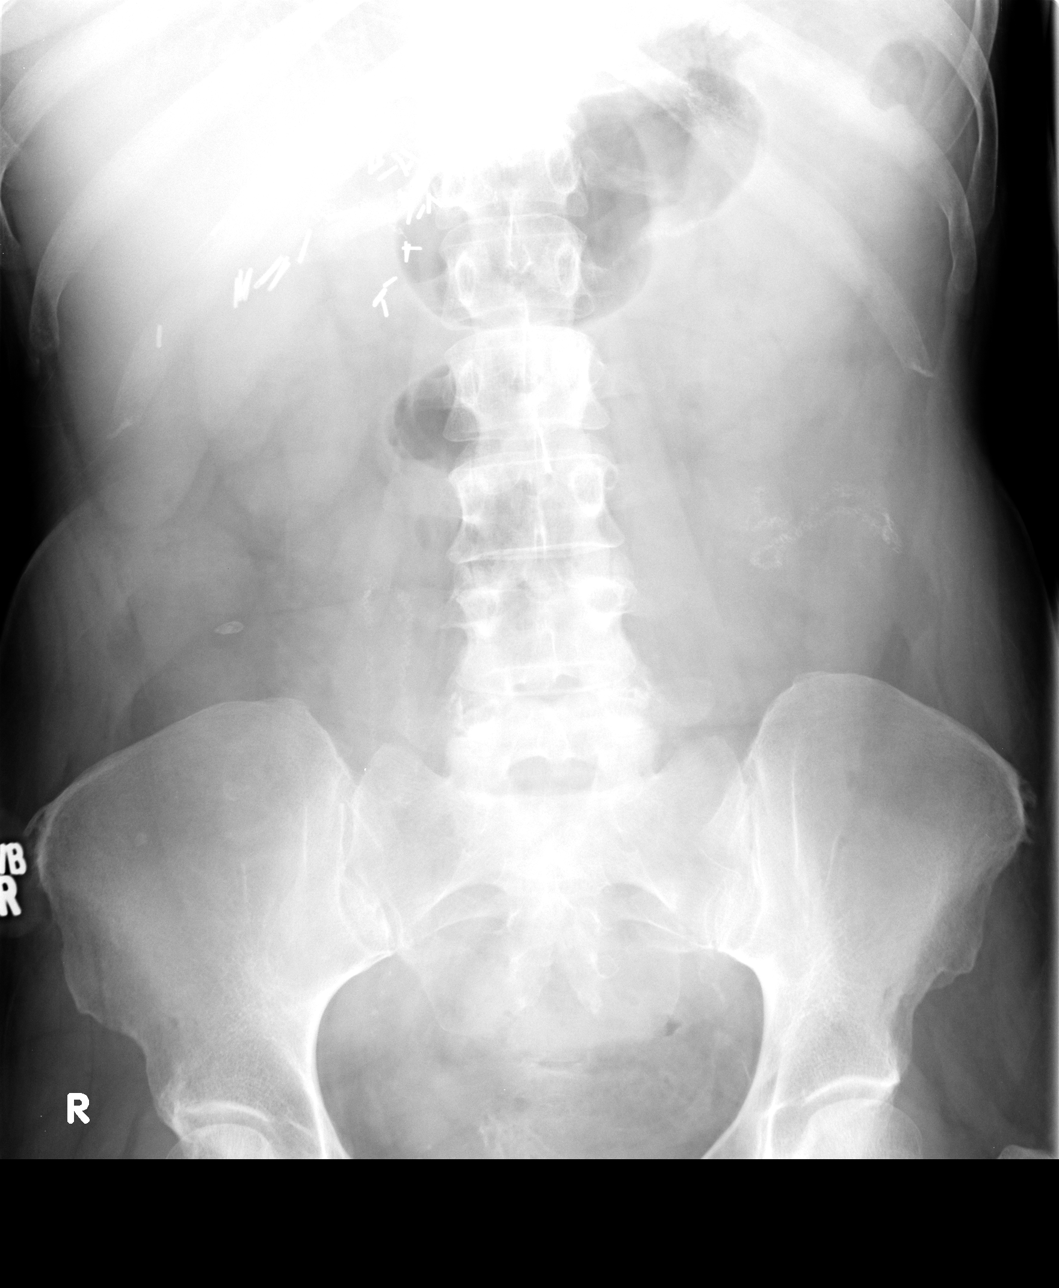

[3 of 3 positions shown; findings below may reference images not displayed]

FINDINGS: There is no evidence of dilated bowel loops or free intraperitoneal
air. Postsurgical changes in the upper abdomen with multiple
surgical clips in the right upper quadrant. Prior gastric bypass. No
radiopaque calculi or other significant radiographic abnormality is
seen. Heart size and mediastinal contours are within normal limits.
Both lungs are clear.
IMPRESSION: Negative abdominal radiographs.  No acute cardiopulmonary disease.

## 2015-04-18 ENCOUNTER — Other Ambulatory Visit (HOSPITAL_COMMUNITY): Payer: Self-pay | Admitting: Internal Medicine

## 2015-04-18 DIAGNOSIS — Z1231 Encounter for screening mammogram for malignant neoplasm of breast: Secondary | ICD-10-CM

## 2015-04-18 DIAGNOSIS — E039 Hypothyroidism, unspecified: Secondary | ICD-10-CM | POA: Diagnosis not present

## 2015-04-18 DIAGNOSIS — D649 Anemia, unspecified: Secondary | ICD-10-CM | POA: Diagnosis not present

## 2015-04-18 DIAGNOSIS — F17200 Nicotine dependence, unspecified, uncomplicated: Secondary | ICD-10-CM | POA: Diagnosis not present

## 2015-04-18 DIAGNOSIS — F209 Schizophrenia, unspecified: Secondary | ICD-10-CM | POA: Diagnosis not present

## 2015-04-18 DIAGNOSIS — I10 Essential (primary) hypertension: Secondary | ICD-10-CM | POA: Diagnosis not present

## 2015-04-27 ENCOUNTER — Ambulatory Visit (HOSPITAL_COMMUNITY): Payer: Self-pay

## 2015-05-01 ENCOUNTER — Telehealth: Payer: Self-pay

## 2015-05-01 DIAGNOSIS — I10 Essential (primary) hypertension: Secondary | ICD-10-CM | POA: Diagnosis not present

## 2015-05-01 DIAGNOSIS — R112 Nausea with vomiting, unspecified: Secondary | ICD-10-CM | POA: Diagnosis not present

## 2015-05-01 NOTE — Telephone Encounter (Signed)
Pt called- she has been vomiting for a week. She is having cramping, upper abd pain. She cant keep anything down, no liquids or medications. She hasnt had much of a bm since she hasnt been able to eat but what she has had was watery diarrhea.  I advised the pt to go to the ED. She said she would go but they dont do anything for her. I advised her that if she hasnt been able to keep any fluids down in a week, she is probably dehydrated and would at least need some IV fluids. She finally agreed that she would go to the ED.

## 2015-05-02 NOTE — Telephone Encounter (Signed)
Agree with the advice provided 

## 2015-05-11 IMAGING — CR DG ABDOMEN ACUTE W/ 1V CHEST
3 series · 3 of 3 positions shown · non-contrast
Comparison: 12/10/2013.

CLINICAL DATA: Patient complaining of constant abdominal pain as
well as a small knot just below the umbilical region, beginning
yesterday. There is associated nausea, vomiting and diarrhea for 2
days. Patient has a history of chronic abdominal pain. History of a
previous gastric bypass surgery and removal of an adrenal tumor.

EXAM:
ACUTE ABDOMEN SERIES (ABDOMEN 2 VIEW & CHEST 1 VIEW)

[view not recorded (1 of 3)]
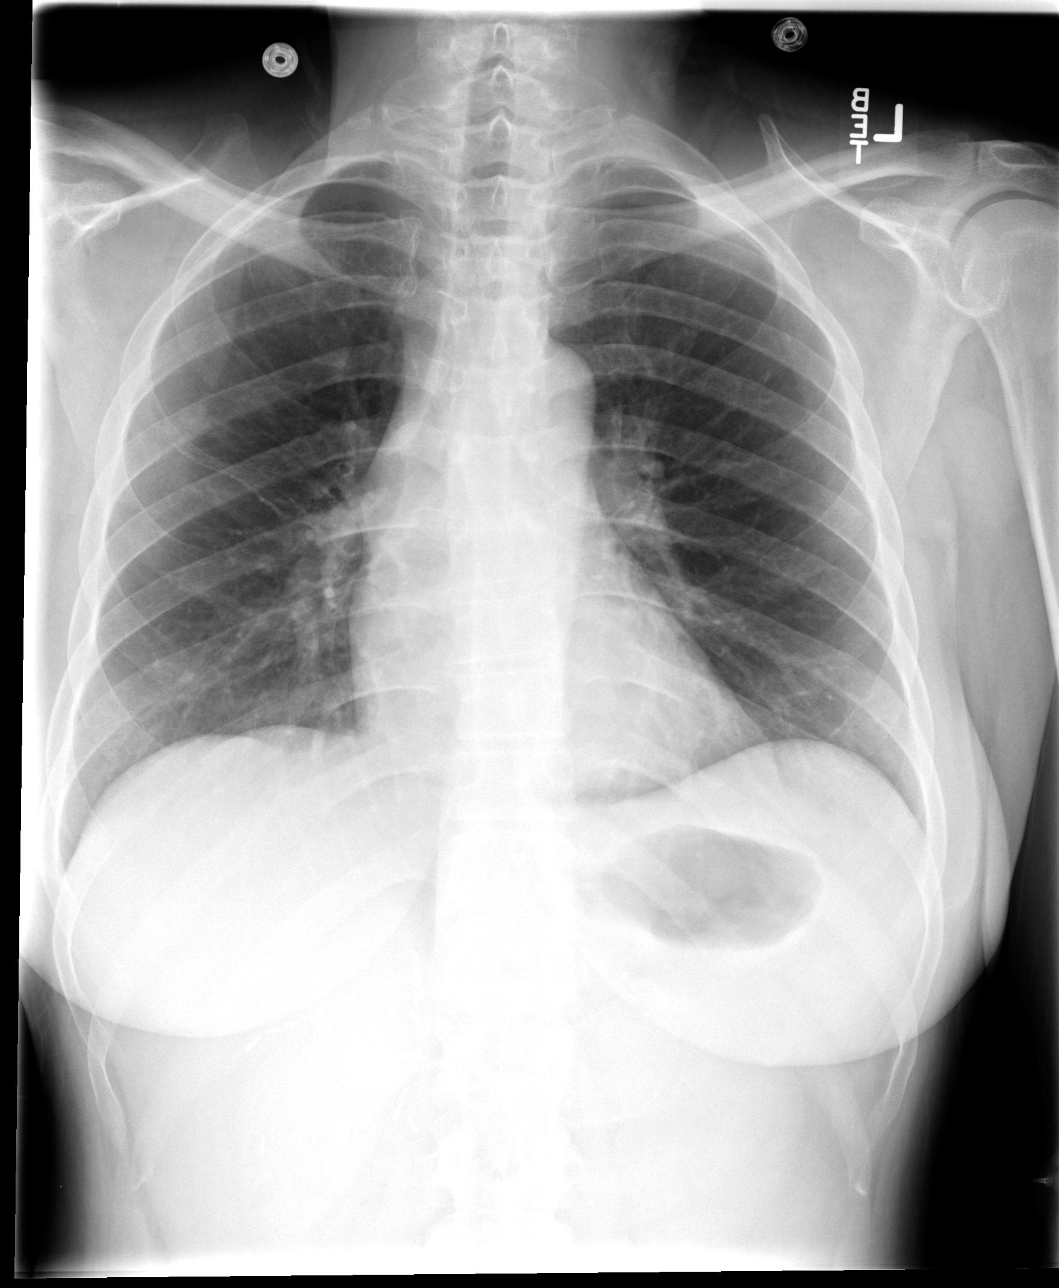

[view not recorded (2 of 3)]
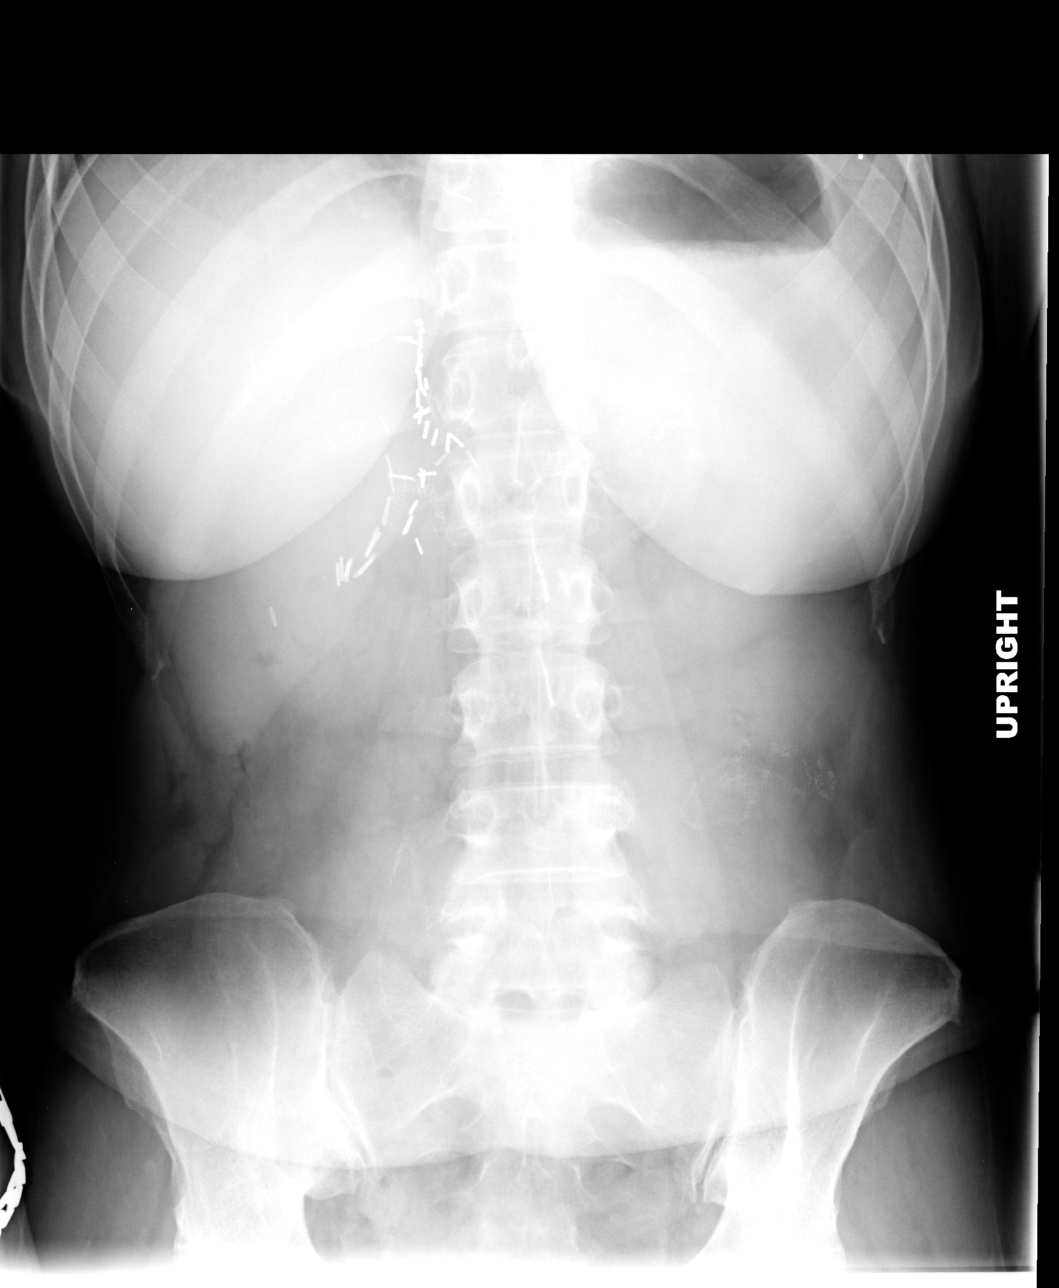

[view not recorded (3 of 3)]
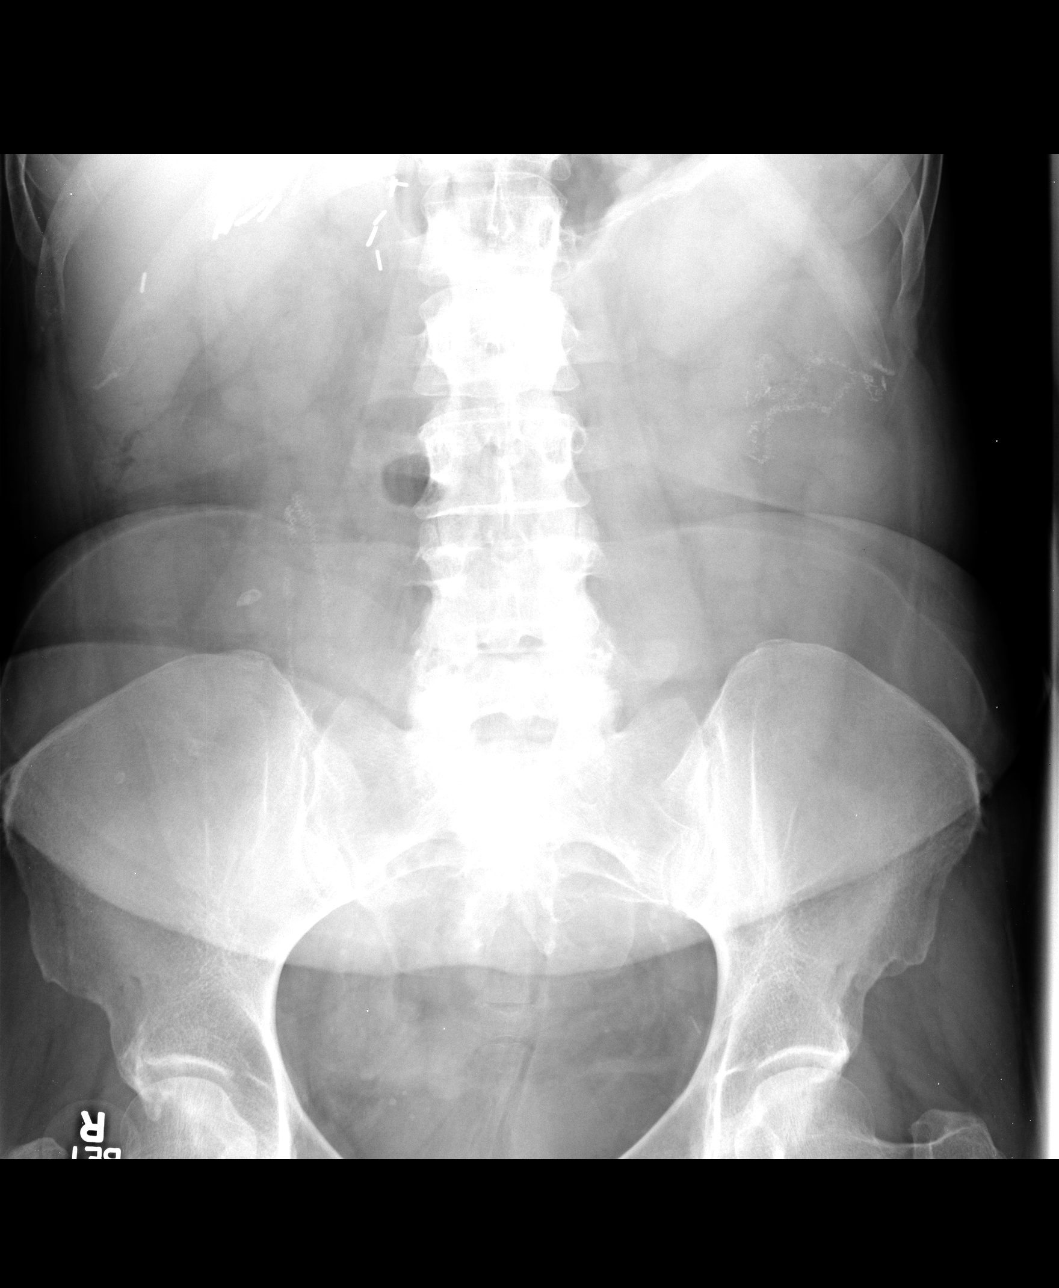

[3 of 3 positions shown; findings below may reference images not displayed]

FINDINGS: There is a paucity of bowel gas. There is no bowel distention to
suggest obstruction or generalized adynamic ileus. There is no free
air.

There are postsurgical changes with bowel anastomosis staples along
the stomach and in the right lower quadrant and left mid abdomen,
unchanged. There also numerous vascular clips in the right upper
quadrant.

Soft tissues are otherwise unremarkable.

Cardiac silhouette is normal in size. No mediastinal or hilar
masses. Lungs are essentially clear.

No significant bony abnormality.
IMPRESSION: 1. No acute findings. No evidence of obstruction, generalized
adynamic ileus or free air.
2. No acute cardiopulmonary disease.

## 2015-05-22 IMAGING — CT CT ABD-PELV W/ CM
2 of 5 series · 15 of 46 positions shown, 17 images · IV contrast (Omnipaque 300)
Comparison: 12/08/2013.

CLINICAL DATA: Initial encounter for 3 day history of abdominal
pain. Status post Billroth surgery.

EXAM:
CT ABDOMEN AND PELVIS WITH CONTRAST
TECHNIQUE: Multidetector CT imaging of the abdomen and pelvis was performed
using the standard protocol following bolus administration of
intravenous contrast.
CONTRAST:  50mL OMNIPAQUE IOHEXOL 300 MG/ML SOLN, 100mL OMNIPAQUE
IOHEXOL 300 MG/ML SOLN

[Series 2: abd_pel_with 5.0 b40f · axial · 0.69mm/px · z∈[-372,+18]mm · 12 of 90 slices shown, 14 images]
[im 6/90  soft-tissue]
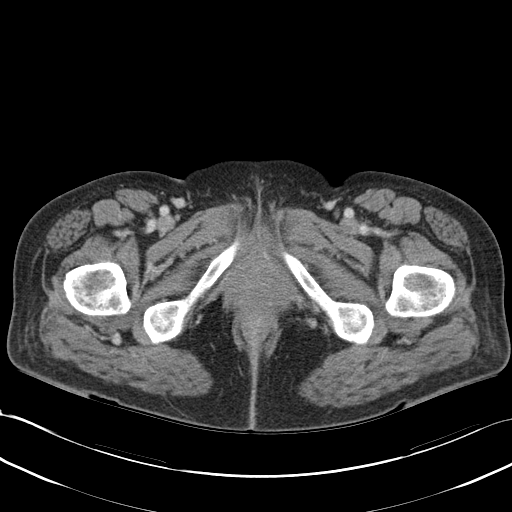
[im 6/90  bone]
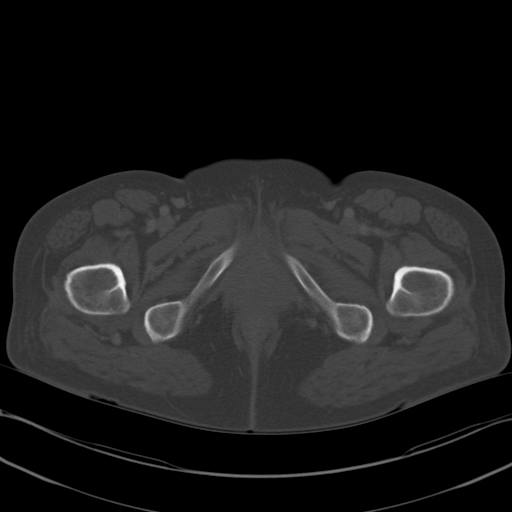
[im 16/90  soft-tissue]
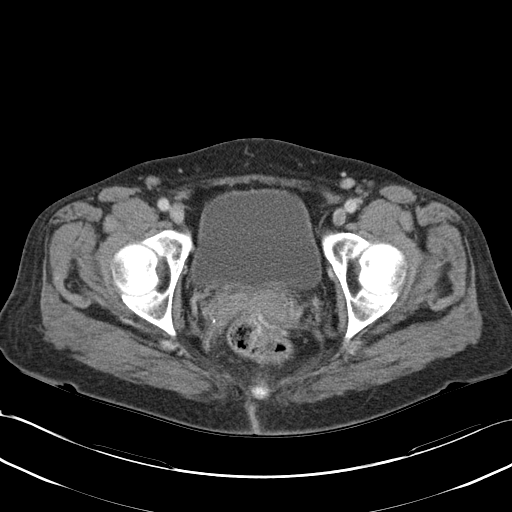
[im 21/90  soft-tissue]
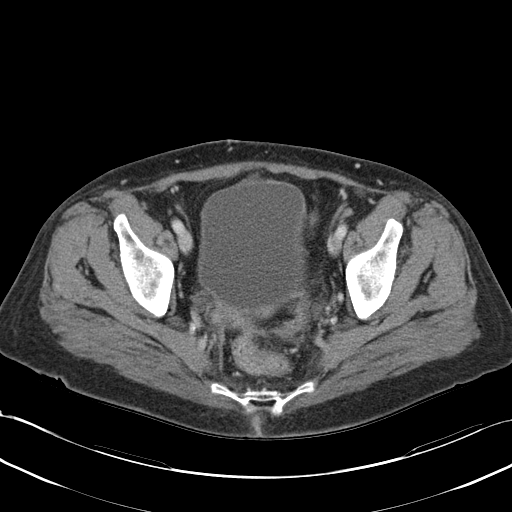
[im 27/90  soft-tissue]
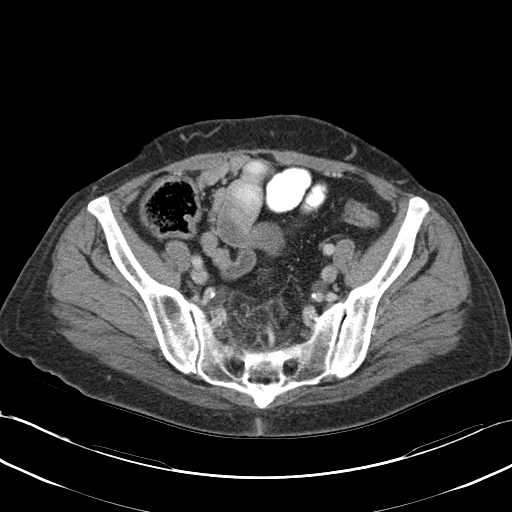
[im 37/90  soft-tissue]
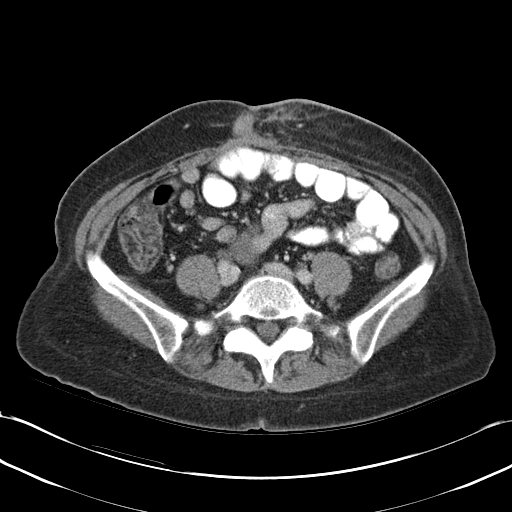
[im 42/90  soft-tissue]
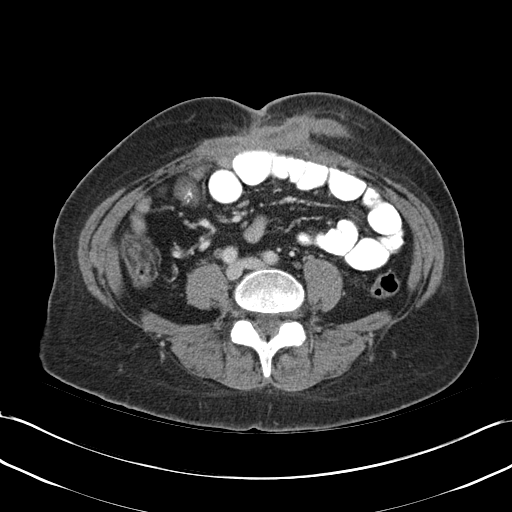
[im 48/90  soft-tissue]
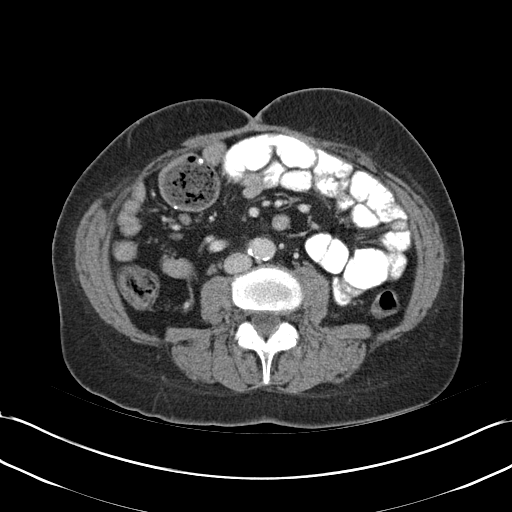
[im 58/90  soft-tissue]
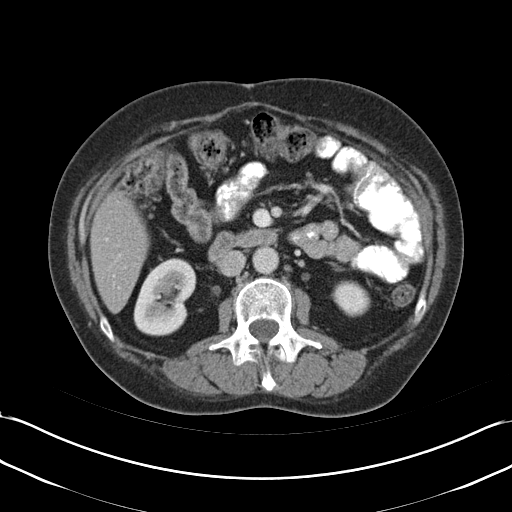
[im 63/90  soft-tissue]
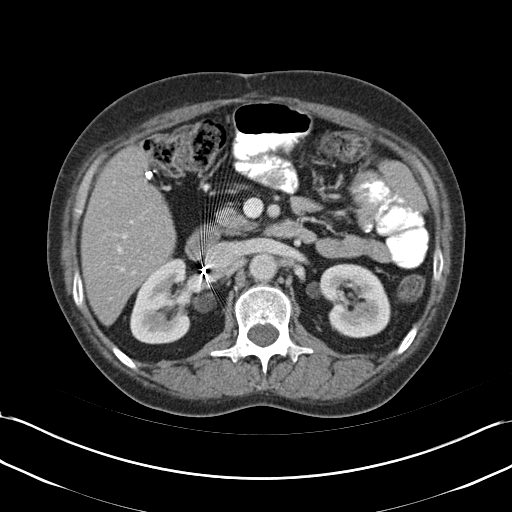
[im 63/90  bone]
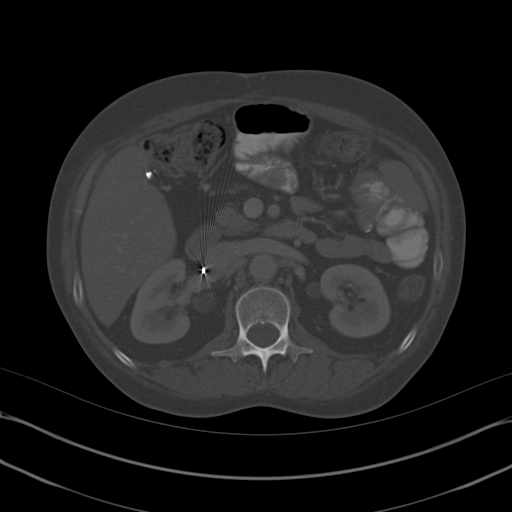
[im 69/90  soft-tissue]
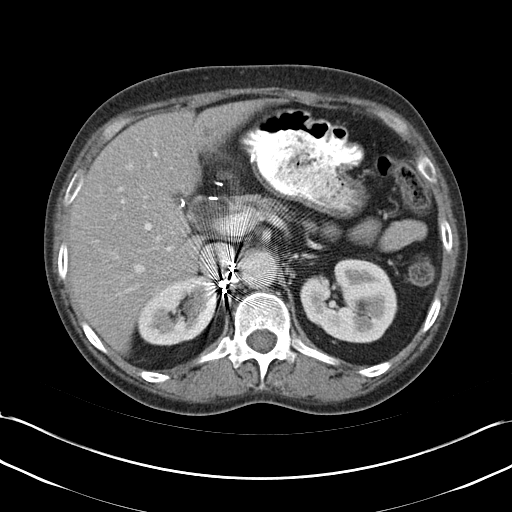
[im 79/90  soft-tissue]
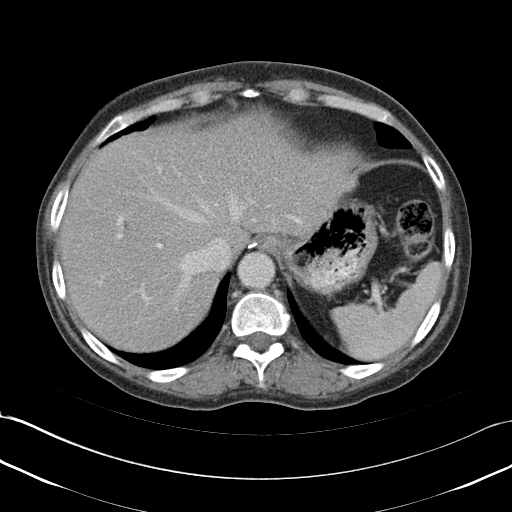
[im 84/90  soft-tissue]
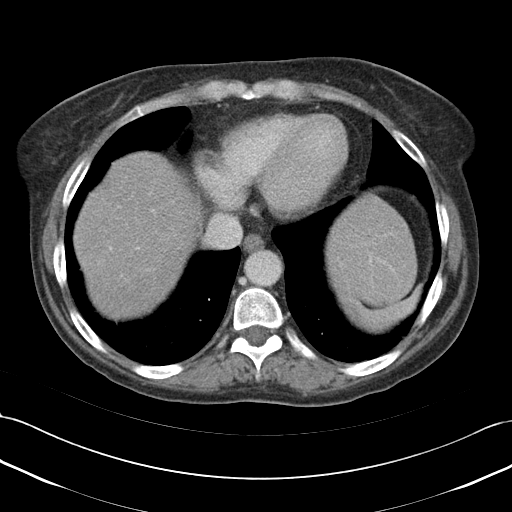

[Series 3: abd_pel_with 3.0 spo cor · coronal · 0.69mm/px · 3 of 82 slices shown]
[im 28/82  soft-tissue]
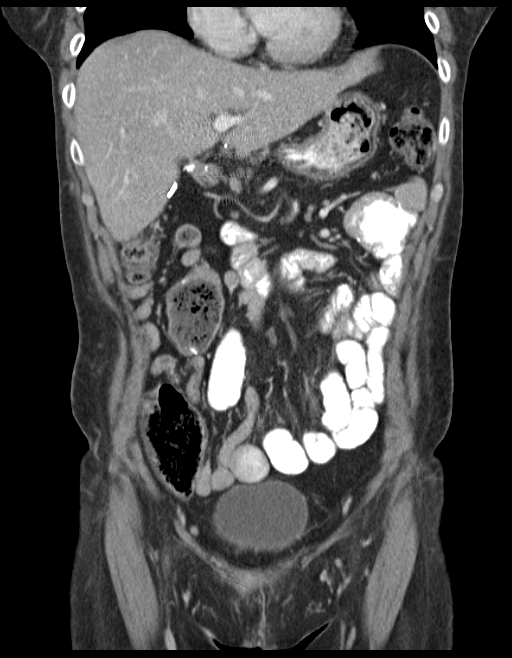
[im 37/82  soft-tissue]
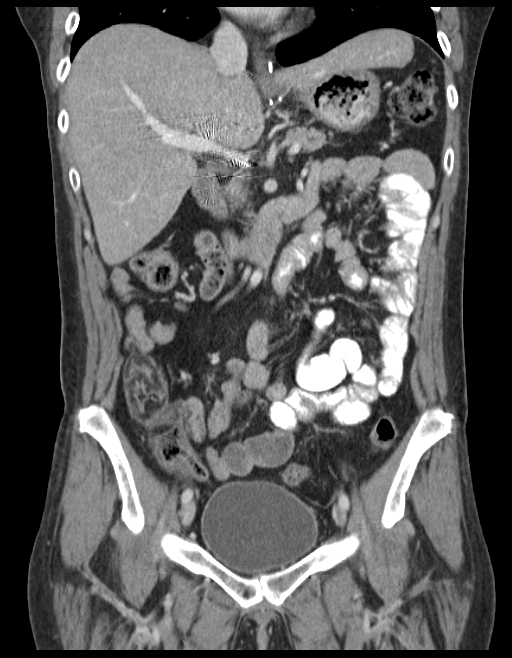
[im 46/82  soft-tissue]
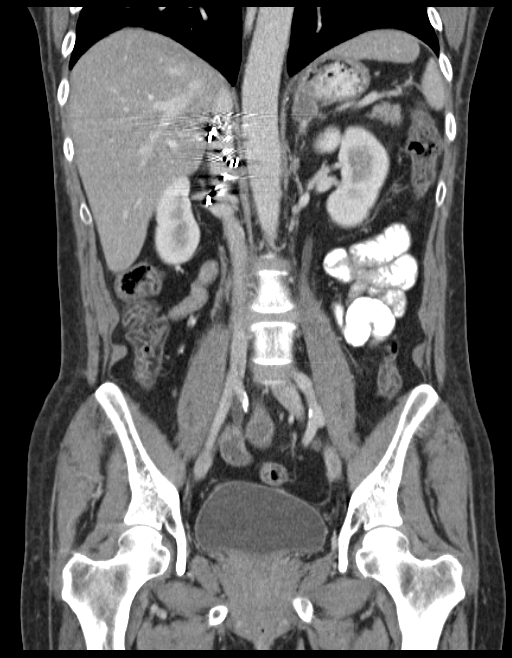

[15 of 46 positions shown; findings below may reference images not displayed]

FINDINGS: Lower chest:  Minimal emphysema in the posterior lung bases.

Hepatobiliary: Insert normal liver gallbladder is surgically absent.
Prominence of the intrahepatic biliary ducts is not substantially
changed in the interval nor is the distension of the common duct.

Pancreas: No focal mass lesion. No peripancreatic edema. No
dilatation of the main pancreatic duct.

Spleen: No splenomegaly. No focal mass lesion.

Adrenals/Urinary Tract: Patient is status post resection of the
right adrenal gland. 20 mm left adrenal nodule is stable. [place]
characteristics are compatible with benign adrenal adenoma. Kidneys
are normal bilaterally.

Stomach/Bowel: The patient is status post distal gastrectomy with
gastroenteric anastomosis patent. No evidence for small bowel
obstruction. Areas of small bowel distention are seen associated
with anastomotic sites suggesting atony. Terminal ileum is normal.
There is mild wall thickening in the right colon with associated
fatty change, findings which may be related to underlying chronic
inflammation. No colonic diverticulitis.

Vascular/Lymphatic: Atherosclerotic calcification is noted in the
wall of the abdominal aorta without aneurysm. No gastrohepatic or
hepatoduodenal ligament lymphadenopathy. No retroperitoneal
lymphadenopathy. No evidence for pelvic sidewall lymphadenopathy.

Reproductive: Uterus is surgically absent.  No adnexal mass.

Other: The abnormal soft tissue attenuation seen in the anterior
abdominal wall, the level of the umbilicus in tracking leftward into
the left paraumbilical abdominal wall is unchanged in the interval.

Musculoskeletal: Bone windows reveal no worrisome lytic or sclerotic
osseous lesions.
IMPRESSION: No substantial interval change. Previously described abnormal soft
tissue attenuation in the anterior abdominal wall, at the level of
the umbilicus and to the left, is not substantially changed. As
before this appears to have subtle central low-attenuation raising
the question for chronic infection/abscess.

Status post distal gastrectomy and right adrenalectomy. No evidence
for bowel obstruction. No acute intraperitoneal abnormality.

Fatty changes in the wall of the colon suggests a history of chronic
inflammation. No evidence for colonic wall edema or pericolonic
edema/ inflammation to suggest active inflammatory process at this
time.

## 2015-05-29 IMAGING — CT CT ABD-PELV W/ CM
2 of 5 series · 15 of 46 positions shown, 17 images · IV contrast (Omnipaque 300)
Comparison: 01/14/2014.

CLINICAL DATA: Hematemesis for 1 day. Chronic nausea and vomiting
for the last 6 months. Unexplained weight loss over the last 3
months. Left lower quadrant abdominal pain. History of previous
abdominal surgery including a cholecystectomy and hysterectomy.
History of an adrenal tumor removal.

EXAM:
CT ABDOMEN AND PELVIS WITH CONTRAST
TECHNIQUE: Multidetector CT imaging of the abdomen and pelvis was performed
using the standard protocol following bolus administration of
intravenous contrast.
CONTRAST:  50mL OMNIPAQUE IOHEXOL 300 MG/ML SOLN, 100mL OMNIPAQUE
IOHEXOL 300 MG/ML SOLN

[Series 2: abd_pel_with 5.0 b40f · axial · 0.59mm/px · z∈[+196,+596]mm · 12 of 92 slices shown, 14 images]
[im 6/92  soft-tissue]
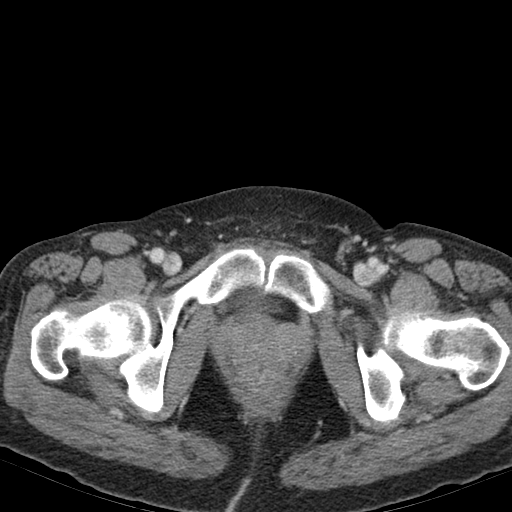
[im 6/92  bone]
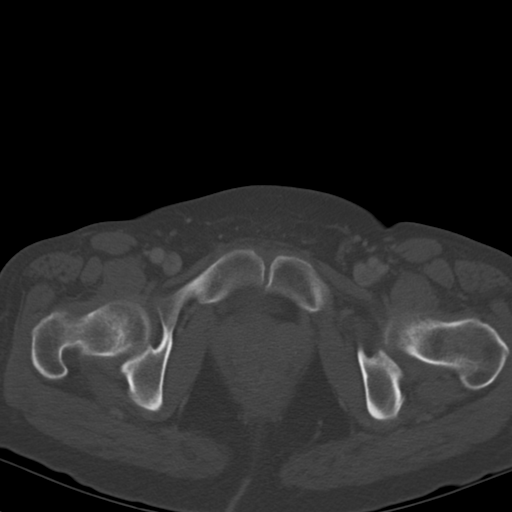
[im 16/92  soft-tissue]
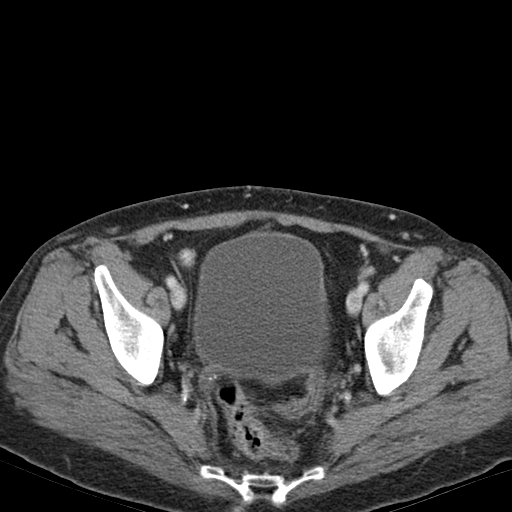
[im 21/92  soft-tissue]
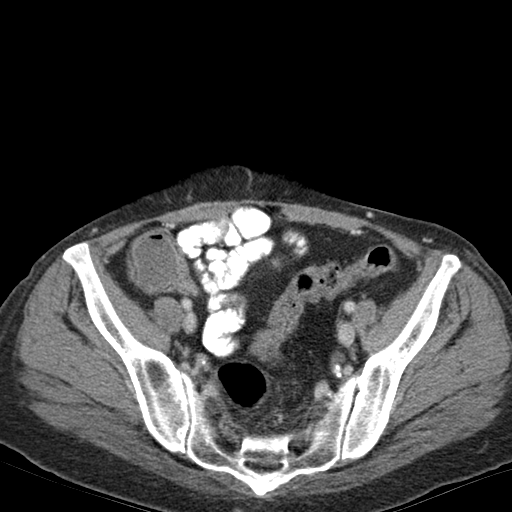
[im 26/92  soft-tissue]
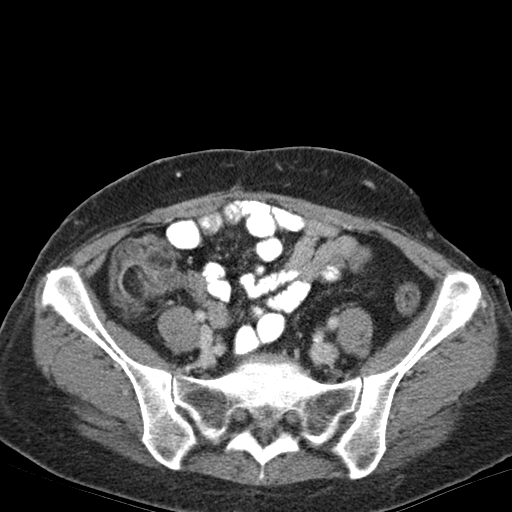
[im 36/92  soft-tissue]
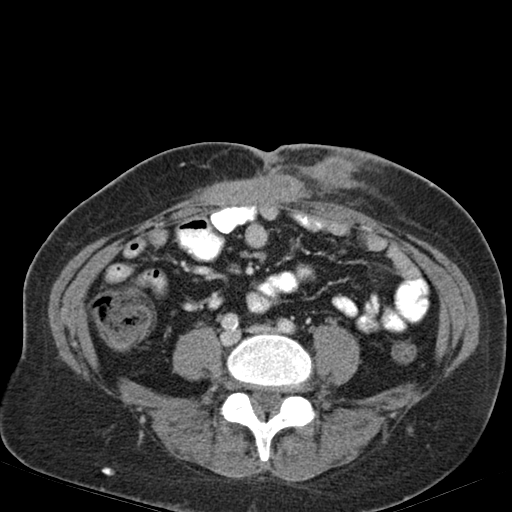
[im 41/92  soft-tissue]
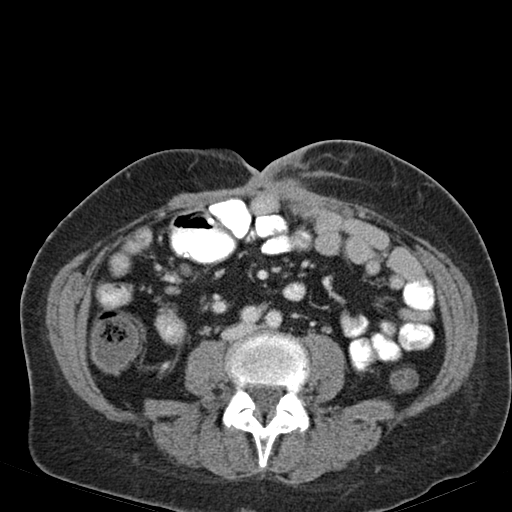
[im 51/92  soft-tissue]
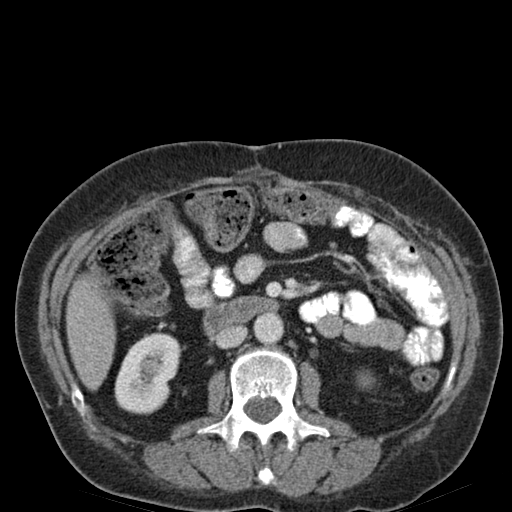
[im 56/92  soft-tissue]
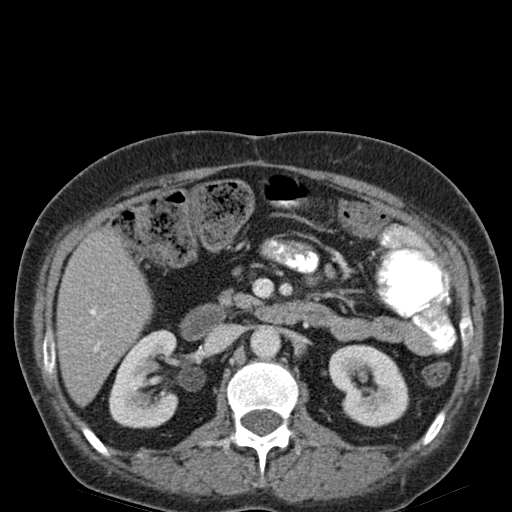
[im 66/92  soft-tissue]
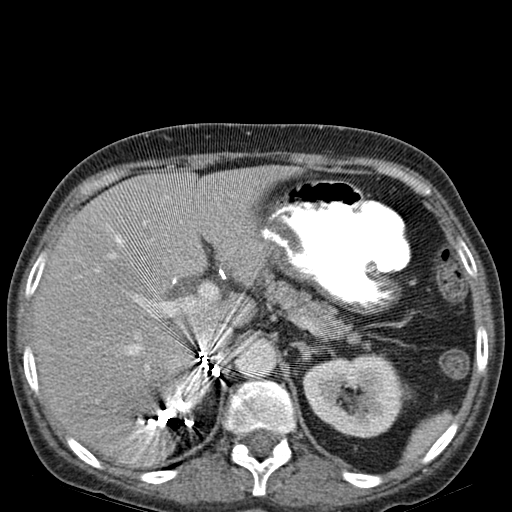
[im 66/92  bone]
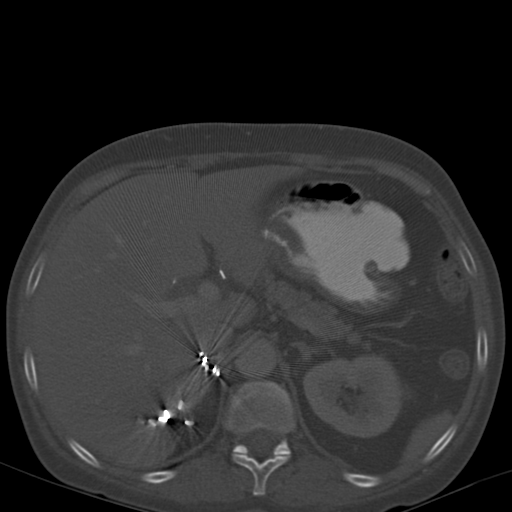
[im 71/92  soft-tissue]
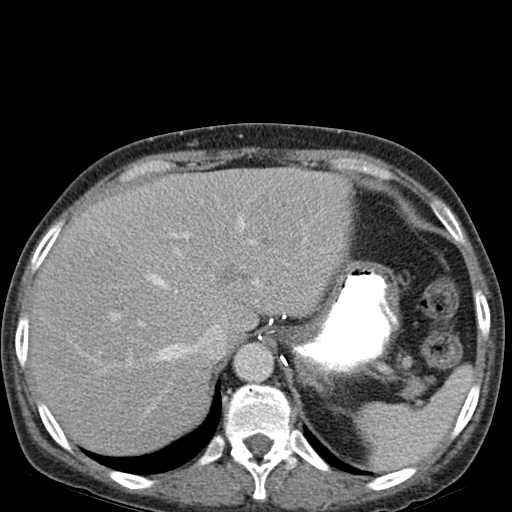
[im 76/92  soft-tissue]
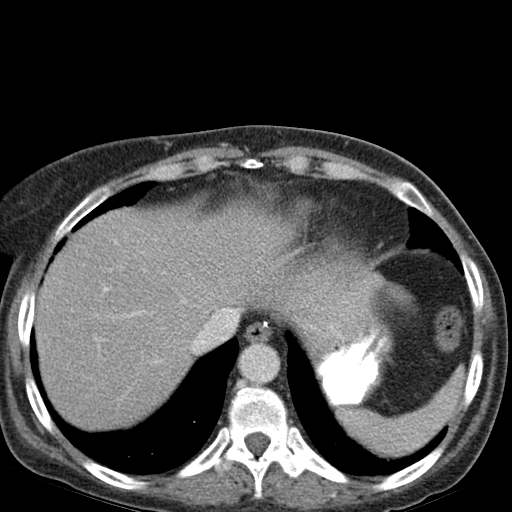
[im 86/92  soft-tissue]
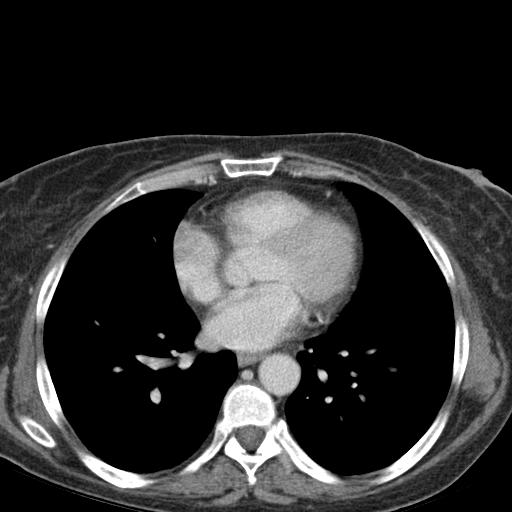

[Series 3: abd_pel_with 3.0 spo cor · coronal · 0.60mm/px · 3 of 83 slices shown]
[im 28/83  soft-tissue]
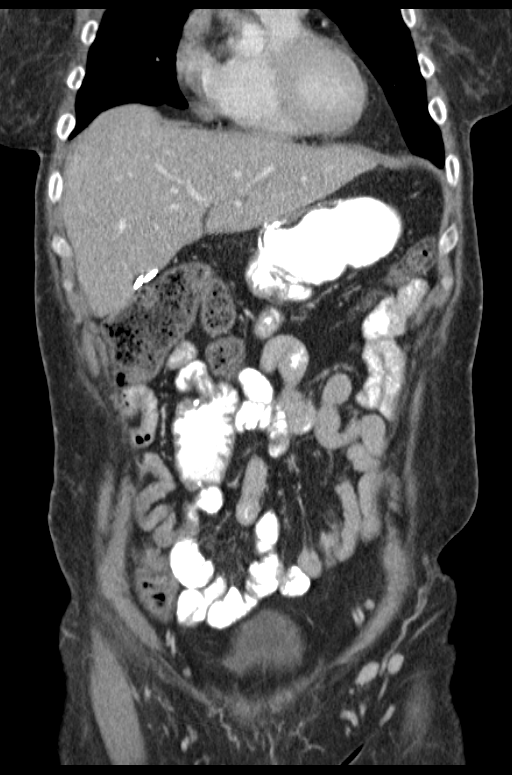
[im 37/83  soft-tissue]
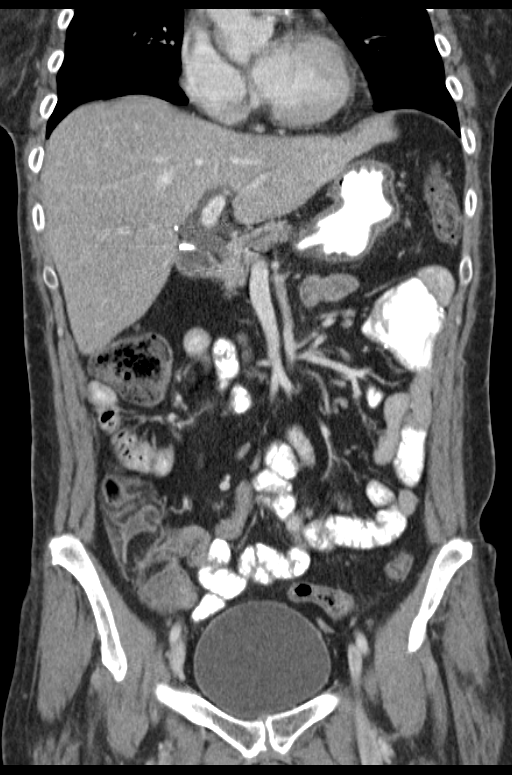
[im 46/83  soft-tissue]
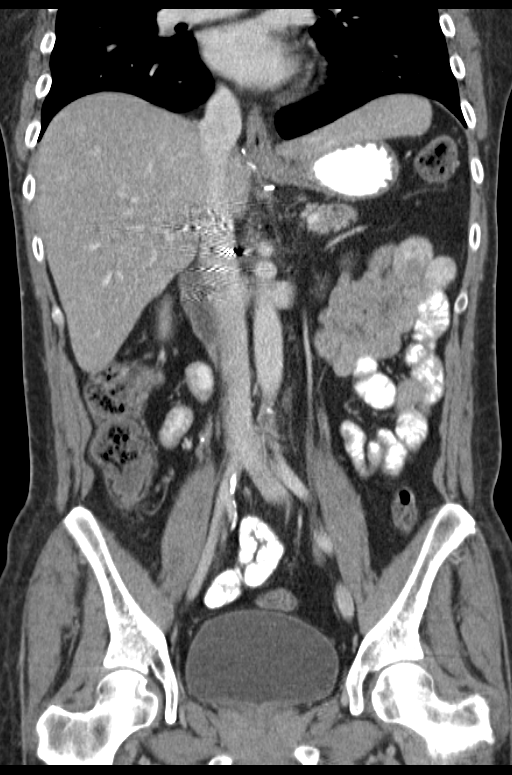

[15 of 46 positions shown; findings below may reference images not displayed]

FINDINGS: 6 mm nodule in the right middle lobe. This was not included on the
field of view from any of the prior abdomen and pelvis CTs. Lung
bases otherwise clear. Heart is normal in size.

Mild diffuse fatty infiltration of the liver. No liver mass or focal
lesion.

Gallbladder surgically absent. Chronic dilation of the intra and
extrahepatic biliary tree, stable.

Normal spleen.

2 cm left adrenal mass, low-density, most consistent with an
adenoma. This is stable. Status post right adrenal resection

Normal kidneys, ureters and bladder.

Uterus is surgically absent.  No pelvic masses.

No adenopathy.  No abnormal fluid collections.

Colon and small bowel are unremarkable. No appendix visualized, it
may be surgically absent. Surgical clips lie in the epigastric
region, stable.

There is abnormal soft tissue along the anterior abdominal wall of
the mid to lower abdomen just below the umbilicus. This is stable
from the prior study consistent postsurgical scarring. It is stable
dating back to April 2013.

No significant bony abnormality.
IMPRESSION: 1. No acute findings. No findings to explain left lower quadrant
pain or unexplained weight loss. No findings to explain hematemesis.
2. Mild hepatic steatosis.
3. Abnormal soft tissue in the low anterior abdominal wall
consistent with chronic postsurgical change/ scarring. This has been
stable since April 2013. Status post cholecystectomy and hysterectomy and right adrenal
resection as well as surgery at the gastroesophageal junction. These
findings are all stable.

## 2015-05-29 IMAGING — CR DG ABDOMEN ACUTE W/ 1V CHEST
3 series · 3 of 3 positions shown · non-contrast
Comparison: CT Abdomen and Pelvis 01/14/2014 and earlier.

CLINICAL DATA: 48-year-old female with small draining wound near
the umbilicus. Left of midline abdominal pain. Initial encounter.
Multiple prior abdominal surgeries, most recently 3053.

EXAM:
ACUTE ABDOMEN SERIES (ABDOMEN 2 VIEW & CHEST 1 VIEW)

[view not recorded (1 of 3)]
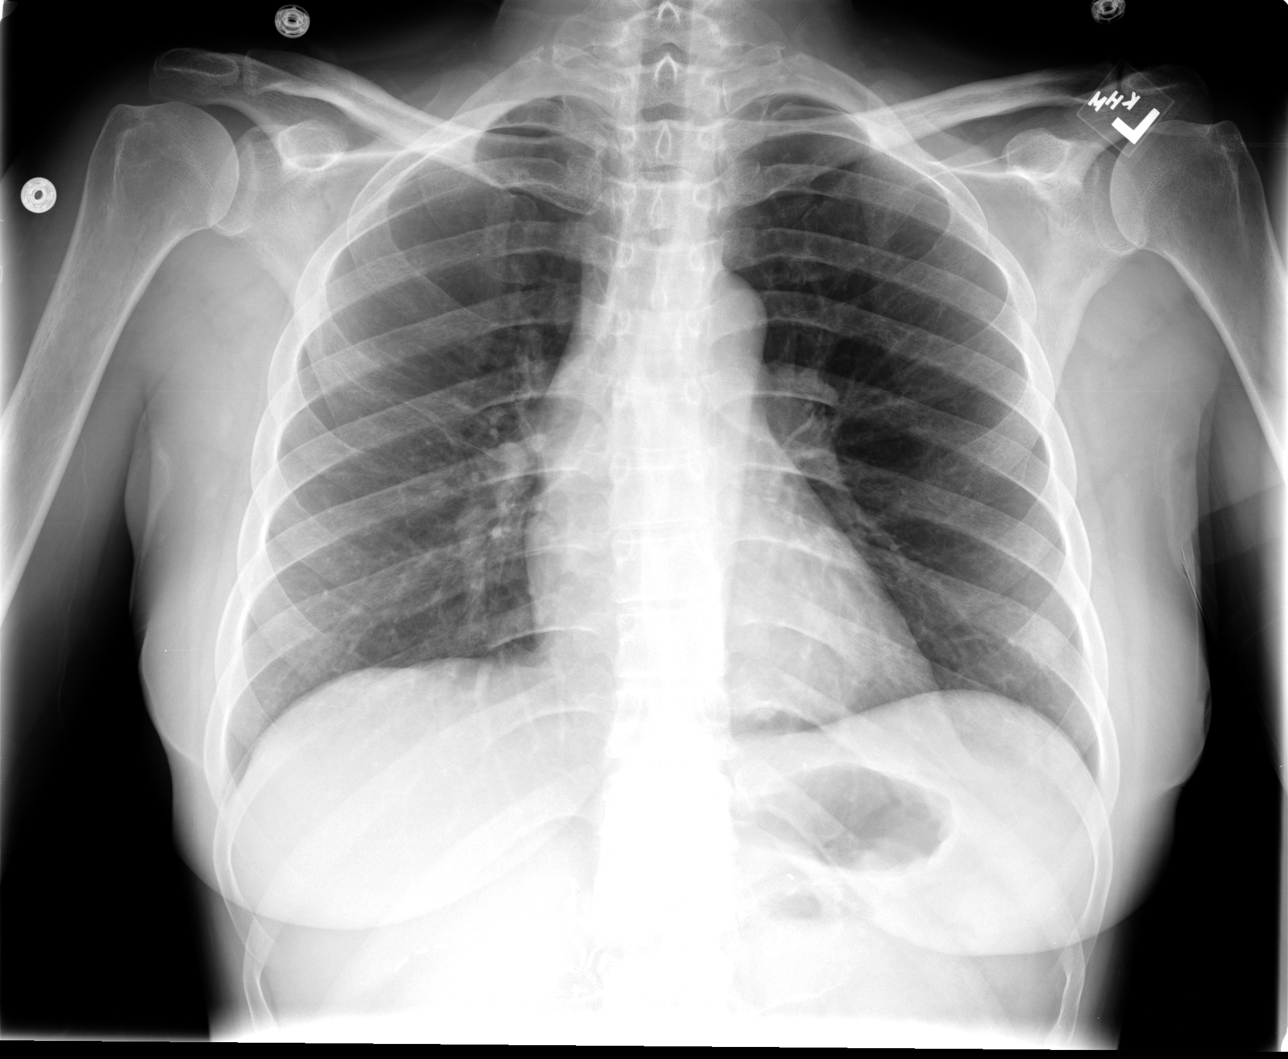

[view not recorded (2 of 3)]
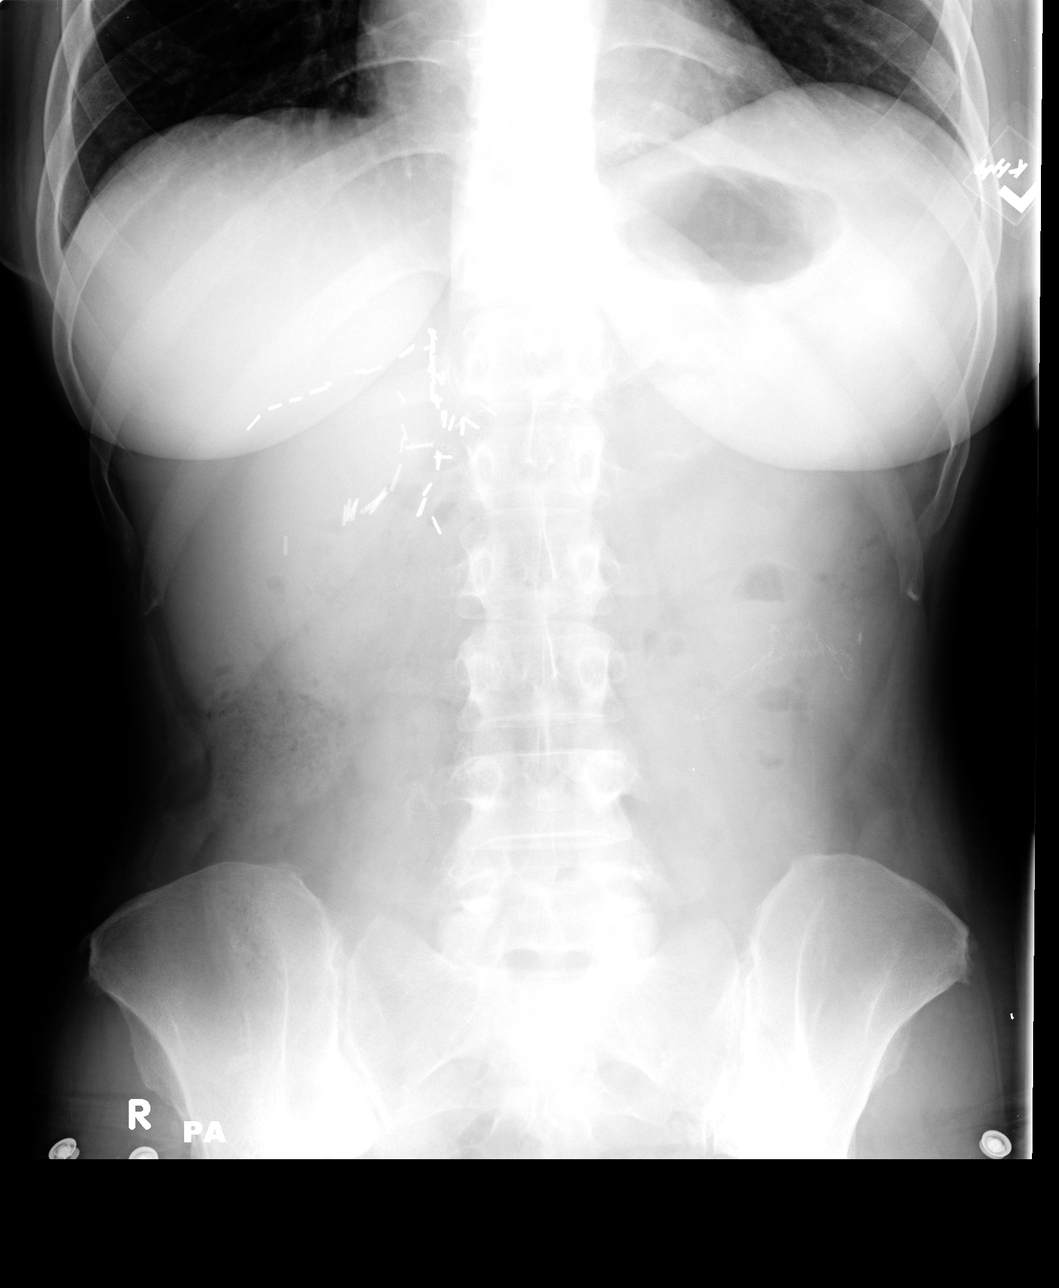

[view not recorded (3 of 3)]
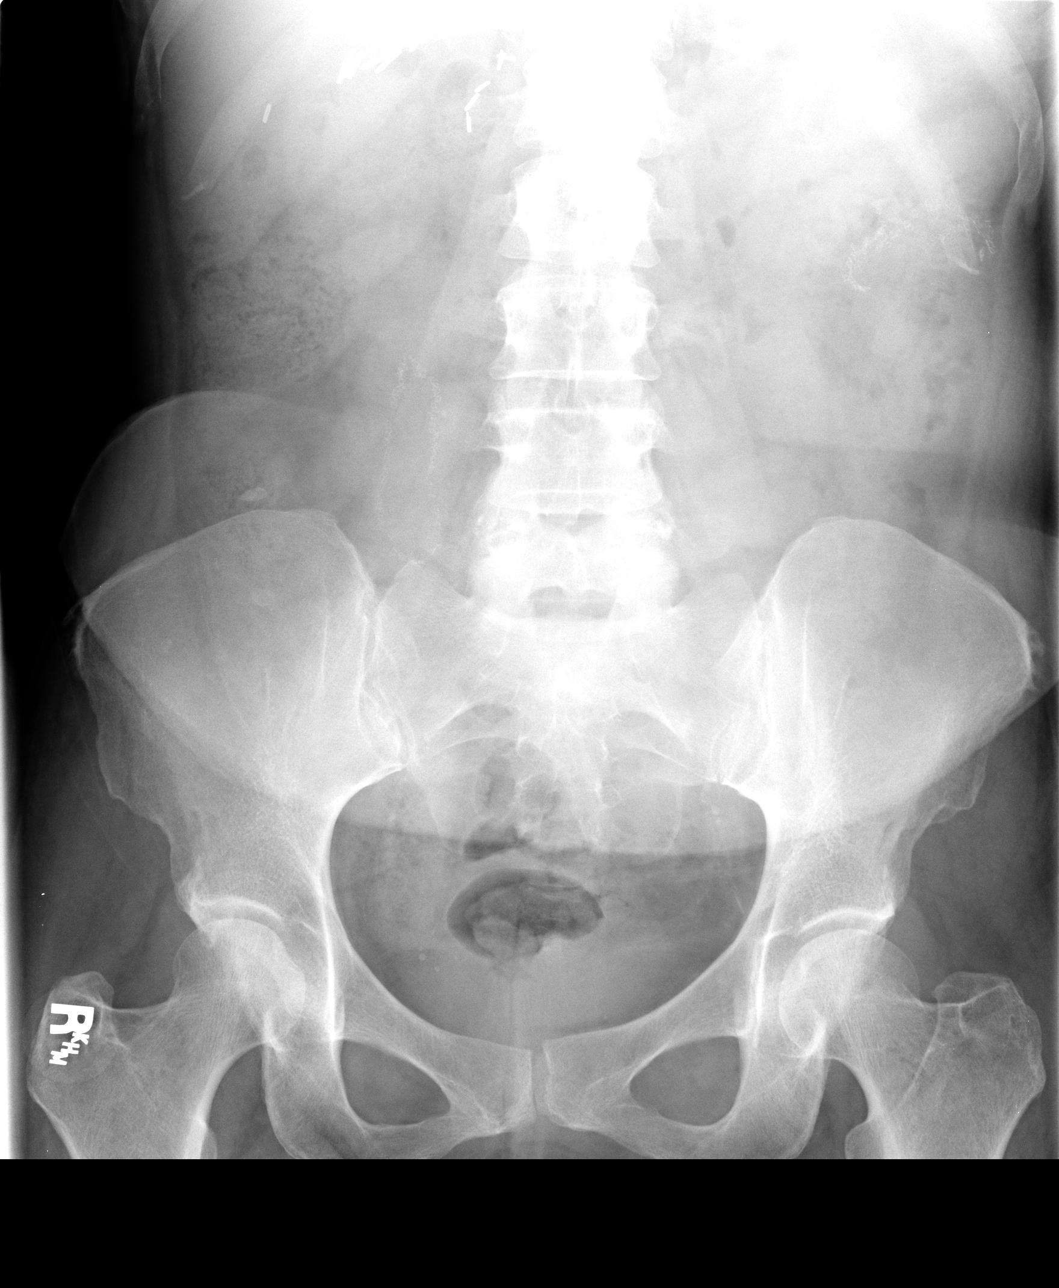

[3 of 3 positions shown; findings below may reference images not displayed]

FINDINGS: Lung volumes are stable and within normal limits. Normal cardiac
size and mediastinal contours. Visualized tracheal air column is
within normal limits. No pneumothorax or pneumoperitoneum.

Stable extensive right upper quadrant surgical clips. Multiple
staple lines re- identified in both the left and right abdomen. Non
obstructed bowel gas pattern. Abdominal and pelvic visceral contours
are stable. No osseous abnormality identified. Right flank calcified
injection granuloma.
IMPRESSION: 1.  Non obstructed bowel gas pattern, no free air.
2. Stable abdominal postoperative changes.
3.  No acute cardiopulmonary abnormality.

## 2015-06-10 IMAGING — CR DG ABDOMEN ACUTE W/ 1V CHEST
3 series · 3 of 3 positions shown · non-contrast
Comparison: CT abdomen pelvis dated 01/21/2014

CLINICAL DATA: Abdominal pain

EXAM:
ACUTE ABDOMEN SERIES (ABDOMEN 2 VIEW & CHEST 1 VIEW)

[view not recorded (1 of 3)]
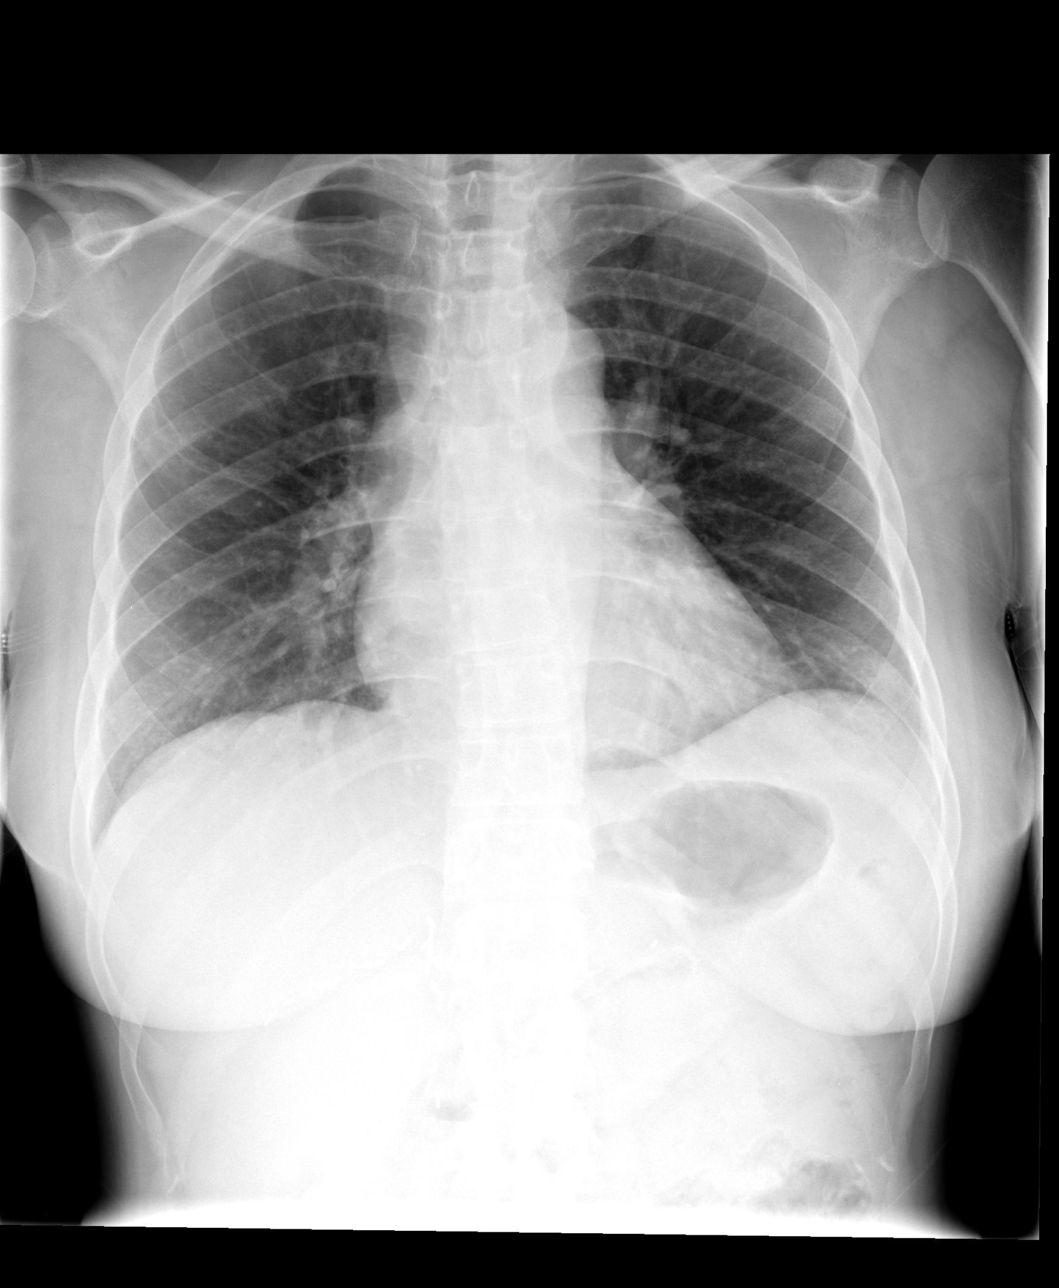

[view not recorded (2 of 3)]
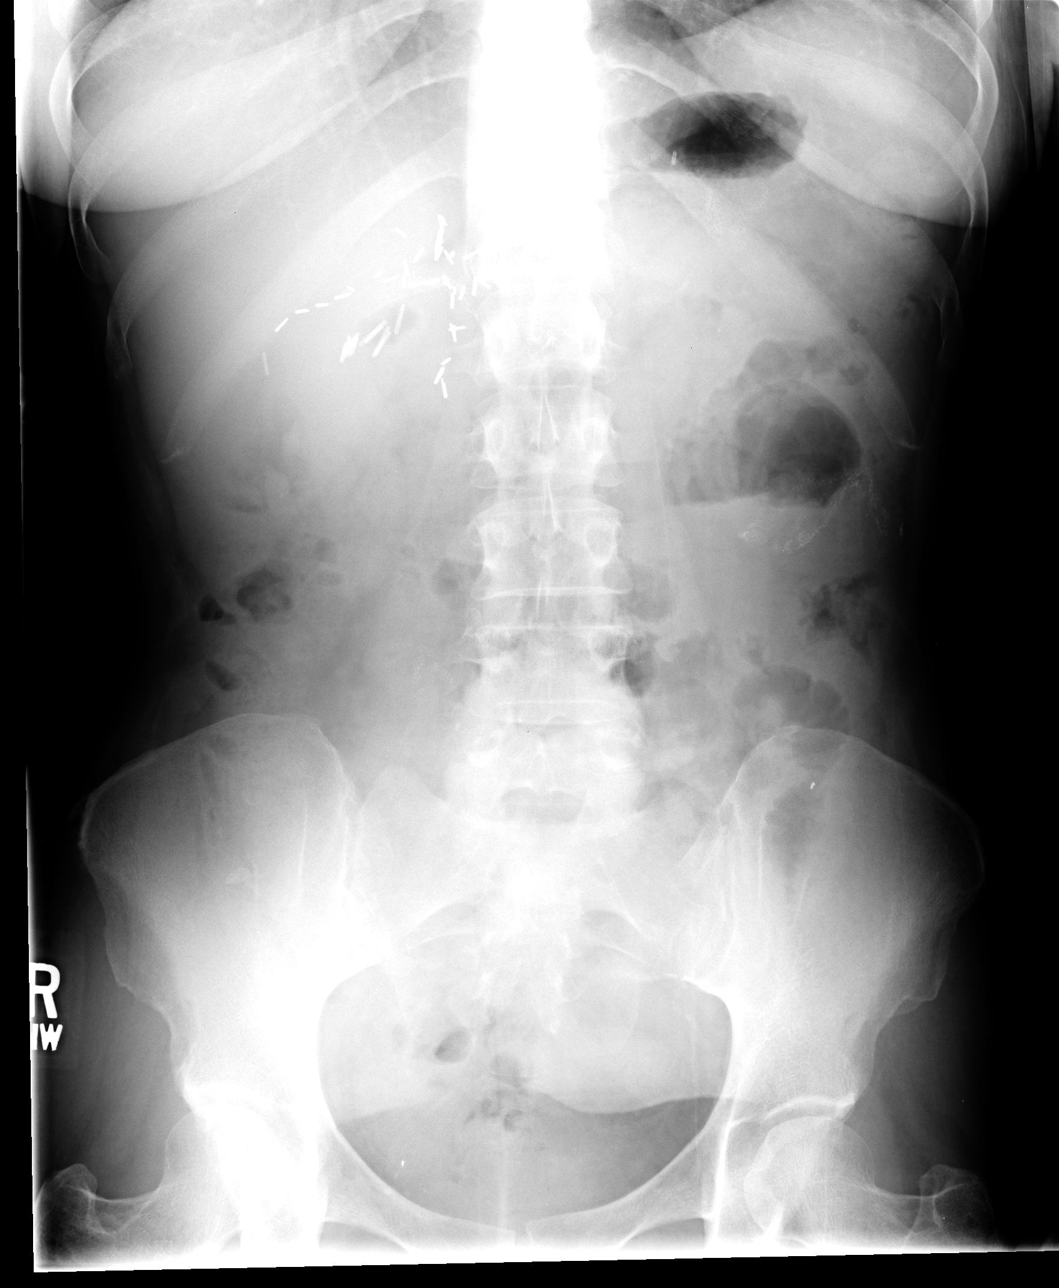

[view not recorded (3 of 3)]
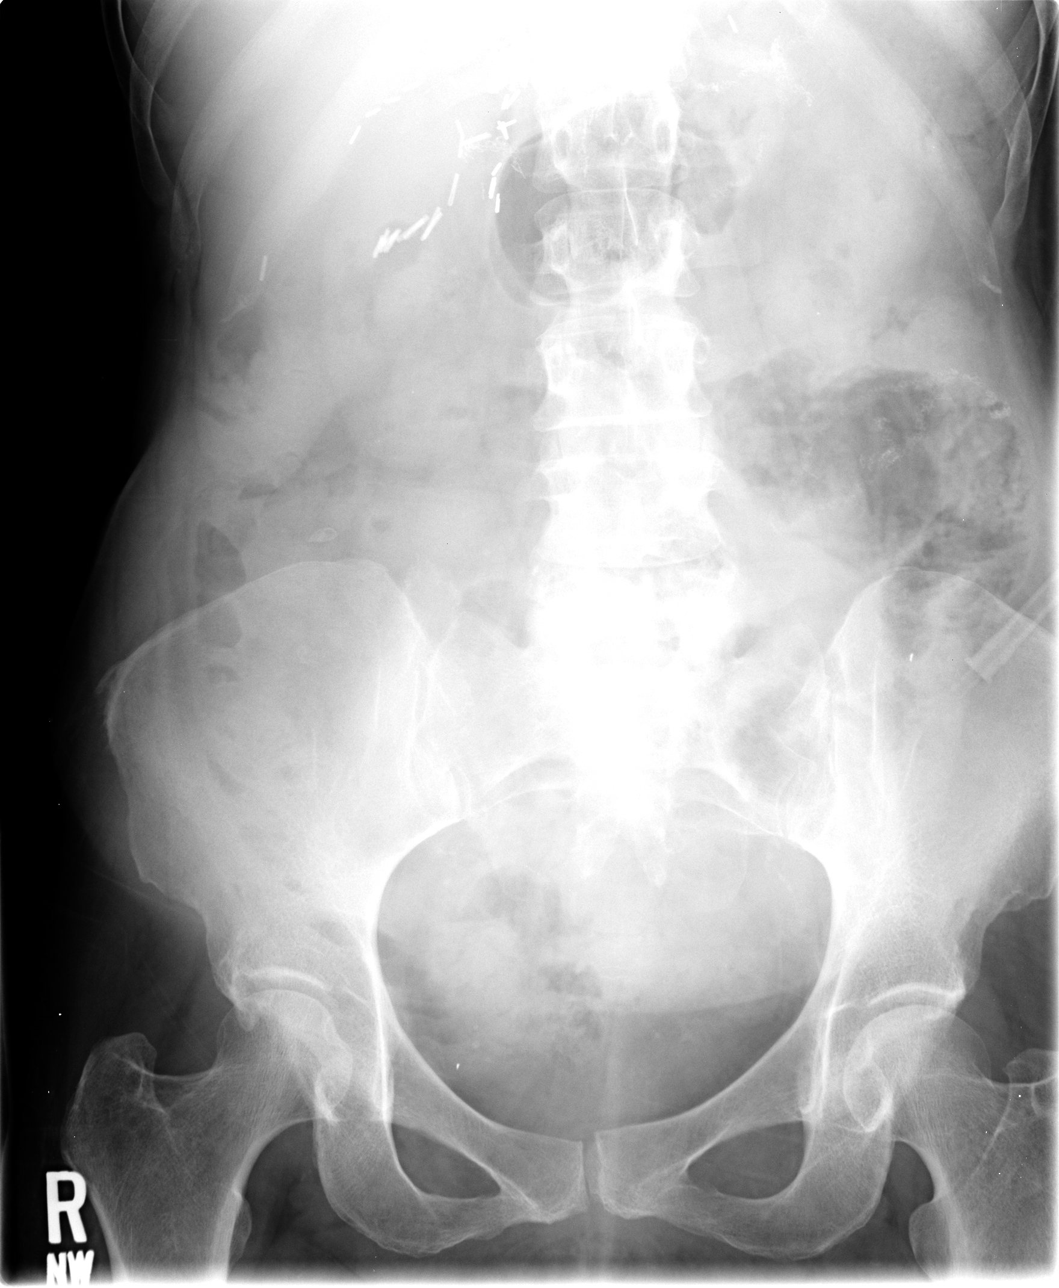

[3 of 3 positions shown; findings below may reference images not displayed]

FINDINGS: Mild patchy opacity in the lateral left lung base, suspicious for
pneumonia. No pleural effusion or pneumothorax.

The heart is normal in size.

Postsurgical changes in the upper abdomen and left mid/ lower
abdomen.

Nonspecific bowel gas pattern, with mildly prominent loops of small
bowel in the left mid abdomen, nonspecific.

No evidence of free air under the diaphragm on the upright view.

Visualized osseous structures are within normal limits.
IMPRESSION: Mild patchy opacity in the lateral left lung base, suspicious for
pneumonia.

Mildly prominent loops of small bowel in the left mid abdomen,
nonspecific. This appearance is not overly suspicious for small
bowel obstruction but is considered indeterminate.

No free air.

## 2015-06-12 ENCOUNTER — Emergency Department (HOSPITAL_COMMUNITY)
Admission: EM | Admit: 2015-06-12 | Discharge: 2015-06-12 | Disposition: A | Discharge status: Home / Self Care | Payer: Medicare Other | Attending: Emergency Medicine | Admitting: Emergency Medicine

## 2015-06-12 ENCOUNTER — Encounter (HOSPITAL_COMMUNITY): Payer: Self-pay | Admitting: Emergency Medicine

## 2015-06-12 DIAGNOSIS — F329 Major depressive disorder, single episode, unspecified: Secondary | ICD-10-CM | POA: Diagnosis not present

## 2015-06-12 DIAGNOSIS — F17210 Nicotine dependence, cigarettes, uncomplicated: Secondary | ICD-10-CM | POA: Diagnosis not present

## 2015-06-12 DIAGNOSIS — Y902 Blood alcohol level of 40-59 mg/100 ml: Secondary | ICD-10-CM | POA: Diagnosis not present

## 2015-06-12 DIAGNOSIS — Z791 Long term (current) use of non-steroidal anti-inflammatories (NSAID): Secondary | ICD-10-CM | POA: Insufficient documentation

## 2015-06-12 DIAGNOSIS — I10 Essential (primary) hypertension: Secondary | ICD-10-CM | POA: Diagnosis not present

## 2015-06-12 DIAGNOSIS — R1013 Epigastric pain: Secondary | ICD-10-CM | POA: Diagnosis present

## 2015-06-12 DIAGNOSIS — F1010 Alcohol abuse, uncomplicated: Secondary | ICD-10-CM | POA: Diagnosis not present

## 2015-06-12 DIAGNOSIS — Z79899 Other long term (current) drug therapy: Secondary | ICD-10-CM | POA: Diagnosis not present

## 2015-06-12 DIAGNOSIS — K625 Hemorrhage of anus and rectum: Secondary | ICD-10-CM

## 2015-06-12 LAB — COMPREHENSIVE METABOLIC PANEL
ALBUMIN: 3.6 g/dL (ref 3.5–5.0)
ALT: 41 U/L (ref 14–54)
AST: 103 U/L — AB (ref 15–41)
Alkaline Phosphatase: 110 U/L (ref 38–126)
Anion gap: 12 (ref 5–15)
BUN: 7 mg/dL (ref 6–20)
CHLORIDE: 104 mmol/L (ref 101–111)
CO2: 19 mmol/L — AB (ref 22–32)
Calcium: 8.5 mg/dL — ABNORMAL LOW (ref 8.9–10.3)
Creatinine, Ser: 0.46 mg/dL (ref 0.44–1.00)
GFR calc Af Amer: >60 mL/min (ref >60)
GFR calc non Af Amer: >60 mL/min (ref >60)
GLUCOSE: 76 mg/dL (ref 65–99)
POTASSIUM: 4.2 mmol/L (ref 3.5–5.1)
SODIUM: 135 mmol/L (ref 135–145)
Total Bilirubin: 0.5 mg/dL (ref 0.3–1.2)
Total Protein: 6.7 g/dL (ref 6.5–8.1)

## 2015-06-12 LAB — LIPASE, BLOOD: Lipase: 20 U/L (ref 11–51)

## 2015-06-12 LAB — POC OCCULT BLOOD, ED: Fecal Occult Bld: POSITIVE — AB

## 2015-06-12 LAB — CBC
HCT: 26.6 % — ABNORMAL LOW (ref 36.0–46.0)
Hemoglobin: 9.4 g/dL — ABNORMAL LOW (ref 12.0–15.0)
MCH: 31 pg (ref 26.0–34.0)
MCHC: 35.3 g/dL (ref 30.0–36.0)
MCV: 87.8 fL (ref 78.0–100.0)
Platelets: 242 10*3/uL (ref 150–400)
RBC: 3.03 MIL/uL — ABNORMAL LOW (ref 3.87–5.11)
RDW: 15.7 % — AB (ref 11.5–15.5)
WBC: 4.9 10*3/uL (ref 4.0–10.5)

## 2015-06-12 LAB — TYPE AND SCREEN
ABO/RH(D): O POS
ANTIBODY SCREEN: NEGATIVE

## 2015-06-12 LAB — CBC WITH DIFFERENTIAL/PLATELET
Basophils Absolute: 0 10*3/uL (ref 0.0–0.1)
Basophils Relative: 0 %
EOS PCT: 1 %
Eosinophils Absolute: 0 10*3/uL (ref 0.0–0.7)
HEMATOCRIT: 30.3 % — AB (ref 36.0–46.0)
Hemoglobin: 10.6 g/dL — ABNORMAL LOW (ref 12.0–15.0)
LYMPHS ABS: 1 10*3/uL (ref 0.7–4.0)
LYMPHS PCT: 25 %
MCH: 30.6 pg (ref 26.0–34.0)
MCHC: 35 g/dL (ref 30.0–36.0)
MCV: 87.6 fL (ref 78.0–100.0)
MONO ABS: 0.4 10*3/uL (ref 0.1–1.0)
Monocytes Relative: 9 %
Neutro Abs: 2.5 10*3/uL (ref 1.7–7.7)
Neutrophils Relative %: 65 %
Platelets: 286 10*3/uL (ref 150–400)
RBC: 3.46 MIL/uL — ABNORMAL LOW (ref 3.87–5.11)
RDW: 14.8 % (ref 11.5–15.5)
WBC: 3.9 10*3/uL — ABNORMAL LOW (ref 4.0–10.5)

## 2015-06-12 LAB — ETHANOL: ALCOHOL ETHYL (B): 59 mg/dL — AB (ref <5)

## 2015-06-12 LAB — PROTIME-INR
INR: 1.18 (ref 0.00–1.49)
PROTHROMBIN TIME: 15.2 s (ref 11.6–15.2)

## 2015-06-12 MED ORDER — SODIUM CHLORIDE 0.9 % IV BOLUS (SEPSIS)
1000.0000 mL | Freq: Once | INTRAVENOUS | Status: AC
  Administered 2015-06-12: 1000 mL via INTRAVENOUS

## 2015-06-12 MED ORDER — PANTOPRAZOLE SODIUM 40 MG IV SOLR
40.0000 mg | Freq: Once | INTRAVENOUS | Status: AC
  Administered 2015-06-12: 40 mg via INTRAVENOUS
  Filled 2015-06-12: qty 40

## 2015-06-12 MED ORDER — DIPHENHYDRAMINE HCL 50 MG/ML IJ SOLN
25.0000 mg | Freq: Once | INTRAMUSCULAR | Status: AC
  Administered 2015-06-12: 25 mg via INTRAVENOUS
  Filled 2015-06-12: qty 1

## 2015-06-12 MED ORDER — METOCLOPRAMIDE HCL 5 MG/ML IJ SOLN
10.0000 mg | Freq: Once | INTRAMUSCULAR | Status: AC
  Administered 2015-06-12: 10 mg via INTRAVENOUS
  Filled 2015-06-12: qty 2

## 2015-06-12 NOTE — ED Notes (Signed)
Patient with abdominal pain and rectal bleeding since Friday. States vomiting as well. H/o bleeding with Dr Gala Romney as GI.

## 2015-06-12 NOTE — Discharge Instructions (Signed)
Gastrointestinal Bleeding °Gastrointestinal bleeding is bleeding somewhere along the path that food travels through the body (digestive tract). This path is anywhere between the mouth and the opening of the butt (anus). You may have blood in your throw up (vomit) or in your poop (stools). If there is a lot of bleeding, you may need to stay in the hospital. °HOME CARE °· Only take medicine as told by your doctor. °· Eat foods with fiber such as whole grains, fruits, and vegetables. You can also try eating 1 to 3 prunes a day. °· Drink enough fluids to keep your pee (urine) clear or pale yellow. °GET HELP RIGHT AWAY IF:  °· Your bleeding gets worse. °· You feel dizzy, weak, or you pass out (faint). °· You have bad cramps in your back or belly (abdomen). °· You have large blood clumps (clots) in your poop. °· Your problems are getting worse. °MAKE SURE YOU:  °· Understand these instructions. °· Will watch your condition. °· Will get help right away if you are not doing well or get worse. °  °This information is not intended to replace advice given to you by your health care provider. Make sure you discuss any questions you have with your health care provider. °  °Document Released: 12/12/2007 Document Revised: 02/19/2012 Document Reviewed: 08/22/2014 °Elsevier Interactive Patient Education ©2016 [employer] ° °

## 2015-06-12 NOTE — ED Provider Notes (Signed)
CSN: [id number]     Arrival date & time 06/12/15  0703 History   First MD Initiated Contact with Patient 06/12/15 [phone]     Chief Complaint  Patient presents with  . Abdominal Pain     Patient is a 50 y.o. female presenting with abdominal pain. The history is provided by the patient.  Abdominal Pain Pain location:  Epigastric Pain quality: burning   Pain severity:  Moderate Onset quality:  Gradual Duration:  3 days Timing:  Constant Progression:  Worsening Chronicity:  Recurrent Relieved by:  Nothing Worsened by:  Nothing tried Associated symptoms: hematochezia, nausea and vomiting   Associated symptoms: no chest pain, no constipation, no fever, no hematemesis and no vaginal bleeding   Risk factors: alcohol abuse and aspirin   Risk factors comment:  Denies anticoagulant use Patient reports 3 days ago she had onset of vomiting (nonbloody) then had onset of epigastric abdominal pain.  She then proceeded to have multiple episodes of bloody stool ( denies dark stool) She uses goody powder She drinks ETOH daily She has h/o GI bleed previously She reports this is similar to prior episodes Her last drink was yesterday   Past Medical History  Diagnosis Date  . Lupus ([place])   . 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 Hypertension   . Abscess     soft tissue  . Suicide attempt ([place])   . Blood transfusion without reported diagnosis   . Chronic abdominal pain   . Chronic wound infection of abdomen ([place])   . Adrenal mass ([place] [employer] [place])   . Anxiety   . Depression   . GERD (gastroesophageal reflux disease)   . Sickle cell trait ([place])   . Gastroparesis Nov 2015  . Nausea and vomiting     chronic, recurrent  . Alcohol abuse   . History of Billroth II operation   . Lung nodule < 6cm on CT 04/25/2014  . Hemorrhoid     internal large  . Diverticulosis     colonoscopy 04/2014 moderat pan colonic  . Colon polyp     colonoscopy 04/2014  . Gastritis     EGD 05/2014  . Hiatal hernia    . Schatzki's ring     patent per EGD 04/2014  . Lung nodule     CT 02/2014 needs repeat 1 month   Past Surgical History  Procedure Laterality Date  . Abdominal surgery    . Abdominal hysterectomy  2013    [place]  . Debridement of abdominal wall abscess N/A 02/08/2013    Procedure: DEBRIDEMENT OF ABDOMINAL WALL ABSCESS;  Surgeon: Jamesetta So, MD;  Location: [facility];  Service: General;  Laterality: N/A;  . Billroth ii procedure       [place], first 2000, 2005/2006.  . Colonoscopy      in [place]  . Cholecystectomy    . Colonoscopy with propofol N/A 05/20/2013    Dr.Rourk- inadequate prep, normal appearing rectum, grossly normal colon aside from pancolonic diverticula, normal terminal ileum bx= unremarkable colonic mucosa. Due for early interval 2016.   Marland Kitchen Esophagogastroduodenoscopy (egd) with propofol N/A 05/20/2013    Dr.Rourk- s/p prior gastric surgery with normal esophagus, residual gastric mucosa and patent efferent limb  . Biopsy  05/20/2013    Procedure: BIOPSIES OF ASCENDING AND SIGMOID COLON;  Surgeon: Daneil Dolin, MD;  Location: [facility];  Service: Endoscopy;;  . Wound exploration Right 06/24/2013    Procedure: exploration of traumatic wound right wrist;  Surgeon: Tennis Must, MD;  Location: [facility];  Service: Orthopedics;  Laterality: Right;  . Tendon repar Right     wrist  . Adrenal turmor removal    . Esophagogastroduodenoscopy (egd) with propofol N/A 02/03/2014    Dr. Gala Romney:  s/p hemigastrectomy with retained gastric contents. Residual gastric mucosa and efferent limb appeared normal otherwise. Query gastroparesis.   . Colonoscopy with propofol N/A 04/26/2014    [id number] ileum/one colon polyp removed/moderate pan-colonic diverticulosis/large internal hemorrhoids  . Esophagogastroduodenoscopy (egd) with propofol N/A 04/26/2014    [id number] ring/small HH/mild non-erosive gasrtitis/normal anastomosis  . Biopsy  04/26/2014    Procedure: BIOPSIES;  Surgeon: Danie Binder,  MD;  Location: [facility];  Service: Endoscopy;;  . Colonoscopy with propofol N/A 12/23/2014    Dr.Rourk- minimal internal hemorrhoids, pancolonic diverticulosis  . Esophagogastroduodenoscopy (egd) with propofol N/A 12/23/2014    Dr.Rourk- s/p prior hemigastrctomy, active oozing from anastomotic suture site, hemostasis achieved  . Agile capsule N/A 01/05/2015    Procedure: AGILE CAPSULE;  Surgeon: Daneil Dolin, MD;  Location: [facility];  Service: Endoscopy;  Laterality: N/A;  0700   Family History  Problem Relation Age of Onset  . Brain cancer Son   . Schizophrenia Son   . Cancer Son     brain  . Breast cancer Maternal Aunt   . Bipolar disorder Maternal Aunt   . Drug abuse Maternal Aunt   . Colon cancer Maternal Grandmother     late 69s, early 24s  . Lung cancer Father   . Cancer Father     mets  . Liver disease Neg Hx   . Drug abuse Mother   . Drug abuse Sister   . Drug abuse Brother   . Bipolar disorder Paternal Grandfather   . Bipolar disorder Cousin    Social History  Substance Use Topics  . Smoking status: Current Every Day Smoker -- 0.50 packs/day for 35 years    Types: Cigarettes  . Smokeless tobacco: Never Used  . Alcohol Use: 7.2 oz/week    12 Cans of beer, 0 Standard drinks or equivalent per week     Comment: 5 12 oz beers a day   OB History    No data available     Review of Systems  Constitutional: Negative for fever.  Cardiovascular: Negative for chest pain.  Gastrointestinal: Positive for nausea, vomiting, abdominal pain, blood in stool and hematochezia. Negative for constipation and hematemesis.  Genitourinary: Negative for vaginal bleeding.  Neurological: Positive for dizziness. Negative for syncope.  All other systems reviewed and are negative.     Allergies  Codeine and Morphine and related  Home Medications   Prior to Admission medications   Medication Sig Start Date End Date Taking? Authorizing Provider  chlordiazePOXIDE (LIBRIUM) 25 MG  capsule 50mg  PO TID x 1D, then 25-50mg  PO BID X 1D, then 25-50mg  PO QD X 1D,   May continue the librium one pill daily if needed 03/29/15   Milton Ferguson, MD  fluticasone [facility]) 50 MCG/ACT nasal spray Place 1 spray into both nostrils daily.    Historical Provider, MD  hydrocortisone (ANUSOL-HC) 25 MG suppository Place 1 suppository (25 mg total) rectally 2 (two) times daily. Patient not taking: Reported on 03/13/2015 12/25/14   Rosita Fire, MD  ibuprofen (ADVIL,MOTRIN) 200 MG tablet Take 400 mg by mouth every 6 (six) hours as needed for mild pain or moderate pain.     Historical Provider, MD  lisinopril (PRINIVIL,ZESTRIL) 10 MG  tablet Take 10 mg by mouth daily.    Historical Provider, MD  metoCLOPramide (REGLAN) 10 MG tablet Take 1 tablet (10 mg total) by mouth 4 (four) times daily -  before meals and at bedtime. Patient not taking: Reported on 03/29/2015 04/06/14   Radene Gunning, NP  ondansetron (ZOFRAN ODT) 8 MG disintegrating tablet Take 1 tablet (8 mg total) by mouth every 8 (eight) hours as needed for nausea or vomiting. Patient not taking: Reported on 03/13/2015 02/10/15   Evalee Jefferson, PA-C  ondansetron (ZOFRAN) 4 MG tablet Take 1 tablet (4 mg total) by mouth every 6 (six) hours. Patient not taking: Reported on 03/29/2015 02/10/15   Evalee Jefferson, PA-C  pantoprazole (PROTONIX) 40 MG tablet Take 1 tablet (40 mg total) by mouth 2 (two) times daily before a meal. Patient taking differently: Take 40 mg by mouth daily.  09/04/14   Daleen Bo, MD  promethazine (PHENERGAN) 25 MG tablet Take 1 tablet (25 mg total) by mouth every 6 (six) hours as needed for nausea or vomiting. Patient not taking: Reported on 03/13/2015 01/07/15   Tammy Triplett, PA-C  sucralfate (CARAFATE) 1 G tablet Take 1 g by mouth 4 (four) times daily.    Historical Provider, MD  traMADol (ULTRAM) 50 MG tablet Take 1 tablet (50 mg total) by mouth 3 (three) times daily as needed. Patient taking differently: Take 50 mg by mouth 3  (three) times daily as needed for moderate pain.  03/13/15   Virgel Manifold, MD   BP 144/102 mmHg  Pulse 108  Temp(Src) 98.7 F (37.1 C) (Oral)  Resp 17  Ht 5\' 3"  (1.6 m)  Wt 61.236 kg  BMI 23.92 kg/m2  SpO2 100% Physical Exam CONSTITUTIONAL: Disheveled, smells of ETOH HEAD: Normocephalic/atraumatic EYES: EOMI/PERRL, conjunctiva pale ENMT: Mucous membranes dry NECK: supple no meningeal signs SPINE/BACK:entire spine nontender CV: S1/S2 noted, no murmurs/rubs/gallops noted LUNGS: Lungs are clear to auscultation bilaterally, no apparent distress ABDOMEN: soft, nontender, no rebound or guarding, bowel sounds noted throughout abdomen, healed midline scar, she has chronic draining wounds on abdominal wall, no focal tenderness Rectal - stool has small amt of blood noted, no large external hemorrhoids noted, female nurse present GU:no cva tenderness NEURO: Pt is awake/alert/appropriate, moves all extremitiesx4.   EXTREMITIES: pulses normal/equal, full ROM SKIN: warm, color normal PSYCH: no abnormalities of mood noted, alert and oriented to situation  ED Course  Procedures   7:57 AM Pt with h/o GI bleed Previous cscope on 12/2014 was negative EGD on 12/2014 revealed blood oozing from anastomotic leak (previous hemigastrectomy) and required clipping 9:49 AM Discussed at length with dr Gala Romney including recent cscope and EGD Will monitor in ED Recheck HGB at noon If no changes, d/c home with PPI and GI followup Will need to STOP goody powder and cut back on ETOH 12:32 PM Pt improved No further bleeding episodes No significant drop in HGB Will d/c home  Labs Review Labs Reviewed  COMPREHENSIVE METABOLIC PANEL - Abnormal; Notable for the following:    CO2 19 (*)    Calcium 8.5 (*)    AST 103 (*)    All other components within normal limits  CBC WITH DIFFERENTIAL/PLATELET - Abnormal; Notable for the following:    WBC 3.9 (*)    RBC 3.46 (*)    Hemoglobin 10.6 (*)    HCT 30.3  (*)    All other components within normal limits  ETHANOL - Abnormal; Notable for the following:    Alcohol, Ethyl (  B) 59 (*)    All other components within normal limits  CBC - Abnormal; Notable for the following:    RBC 3.03 (*)    Hemoglobin 9.4 (*)    HCT 26.6 (*)    RDW 15.7 (*)    All other components within normal limits  POC OCCULT BLOOD, ED - Abnormal; Notable for the following:    Fecal Occult Bld POSITIVE (*)    All other components within normal limits  LIPASE, BLOOD  PROTIME-INR  POC OCCULT BLOOD, ED  TYPE AND SCREEN   I have personally reviewed and evaluated lab results as part of my medical decision-making.  Medications  pantoprazole (PROTONIX) injection 40 mg (40 mg Intravenous Given 06/12/15 0809)  metoCLOPramide (REGLAN) injection 10 mg (10 mg Intravenous Given 06/12/15 0913)  diphenhydrAMINE (BENADRYL) injection 25 mg (25 mg Intravenous Given 06/12/15 0913)  sodium chloride 0.9 % bolus 1,000 mL (1,000 mLs Intravenous New Bag/Given 06/12/15 1025)     MDM   Final diagnoses:  Rectal bleed  Alcohol abuse    Nursing notes including past medical history and social history reviewed and considered in documentation Labs/vital reviewed myself and considered during evaluation Previous records reviewed and considered     Ripley Fraise, MD 06/12/15 1233

## 2015-06-12 NOTE — ED Notes (Signed)
Lab at bedside to obtain samples.

## 2015-06-12 NOTE — ED Notes (Signed)
Unable to obtain bloodwork from IV. Lab notified.

## 2015-06-22 ENCOUNTER — Telehealth: Payer: Self-pay

## 2015-06-22 DIAGNOSIS — R110 Nausea: Secondary | ICD-10-CM

## 2015-06-22 NOTE — Telephone Encounter (Signed)
Pt is calling to see if she can have something for nausea called into the [facility]

## 2015-06-23 MED ORDER — ONDANSETRON HCL 4 MG PO TABS
4.0000 mg | ORAL_TABLET | Freq: Four times a day (QID) | ORAL | Status: DC
Start: 2015-06-23 — End: 2016-02-20

## 2015-06-23 NOTE — Telephone Encounter (Signed)
Notify patient I refilled Zofran.

## 2015-06-26 NOTE — Telephone Encounter (Signed)
Rx printed off and I have faxed to [facility] and pt is aware.
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
## 2015-07-20 IMAGING — CR DG CHEST 2V
2 series · 2 of 2 positions shown · non-contrast
Comparison: 03/02/2014 chest CT and plain film

CLINICAL DATA: Wheezing and shortness of breath. Mid chest pain
radiating down the left arm. Chronic productive cough with milky
white sputum. History of hypertension.

EXAM:
CHEST  2 VIEW

[view not recorded (1 of 2)]
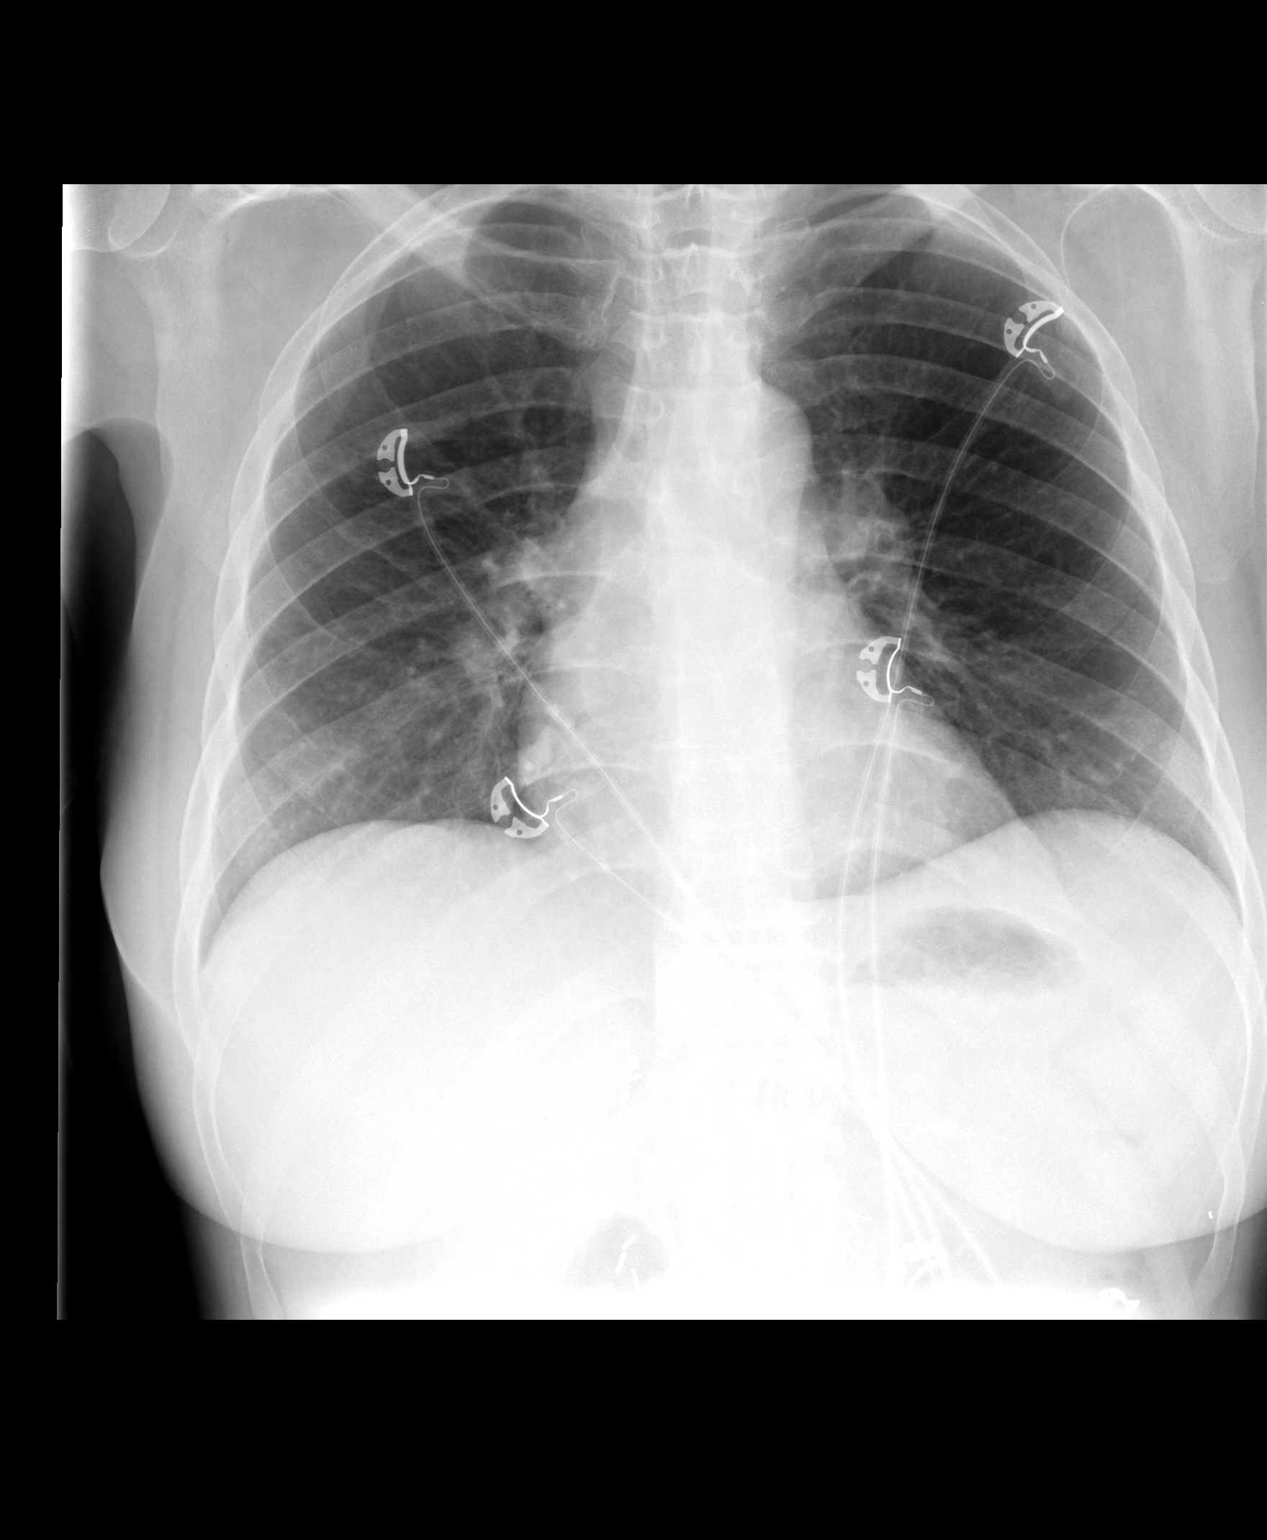

[view not recorded (2 of 2)]
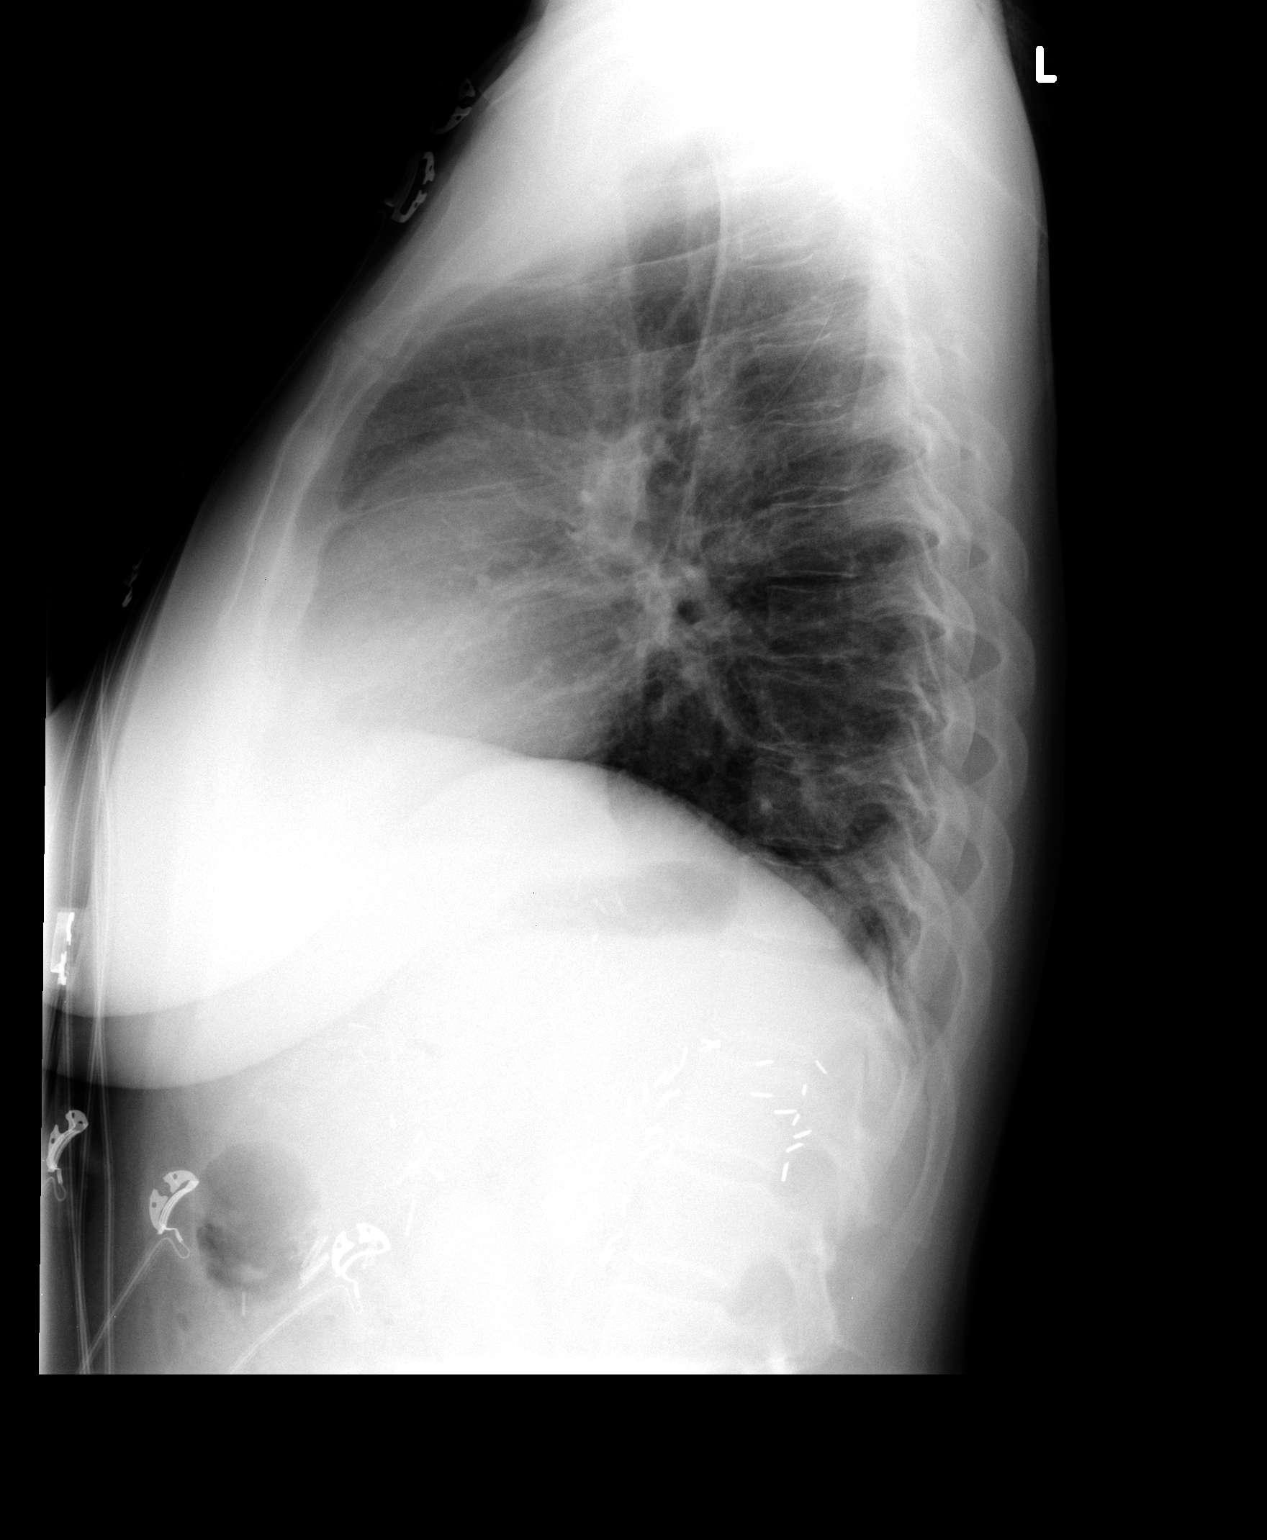

[2 of 2 positions shown; findings below may reference images not displayed]

FINDINGS: Heart size is normal. There is persistent patchy density at the
right lung base. Minimal left lower lobe atelectasis. No focal
consolidations. No pulmonary edema. Surgical clips are present in
the upper abdomen. Visualized osseous structures have a normal
appearance.
IMPRESSION: 1. Persistent right lower lobe infiltrate.
2. Minimal left lower lobe atelectasis.

## 2015-08-10 DIAGNOSIS — J0100 Acute maxillary sinusitis, unspecified: Secondary | ICD-10-CM | POA: Diagnosis not present

## 2015-08-10 DIAGNOSIS — H6091 Unspecified otitis externa, right ear: Secondary | ICD-10-CM | POA: Diagnosis not present

## 2015-08-11 IMAGING — RF DG UGI W/ KUB
19 of 24 series · 19 of 24 positions shown · non-contrast
Comparison: CT abdomen and pelvis 03/02/2014

CLINICAL DATA: Postprandial vomiting, history of Billroth II
gastrojejunostomy, question anastomotic stricture

EXAM:
UPPER GI SERIES WITH KUB
TECHNIQUE: After obtaining a scout radiograph a routine upper GI series was
performed using thin barium
FLUOROSCOPY TIME:  1 min 36 seconds

[Series 1: run · 1 of 1 slices shown (1 of 19)]
[im 1/1]
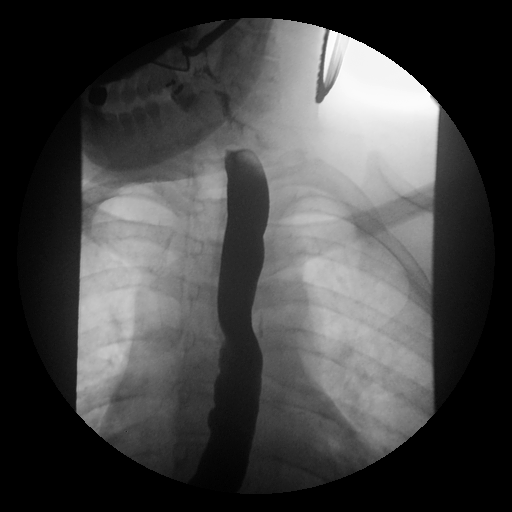

[Series 2: run · 1 of 1 slices shown (2 of 19)]
[im 1/1]
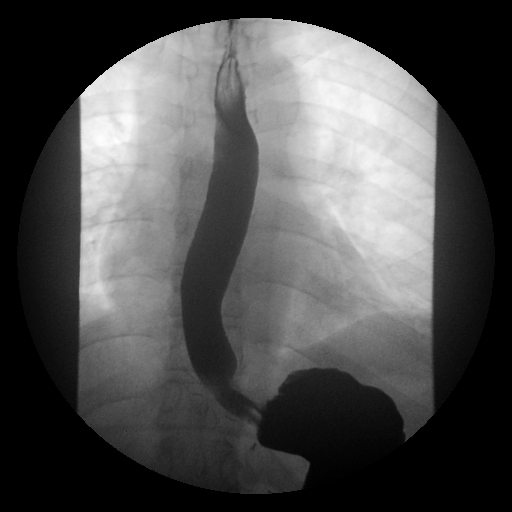

[Series 4: run · 1 of 1 slices shown (3 of 19)]
[im 1/1]
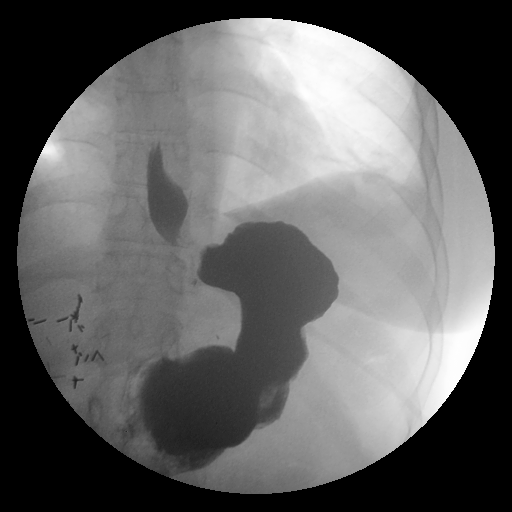

[Series 5: run · 1 of 1 slices shown (4 of 19)]
[im 1/1]
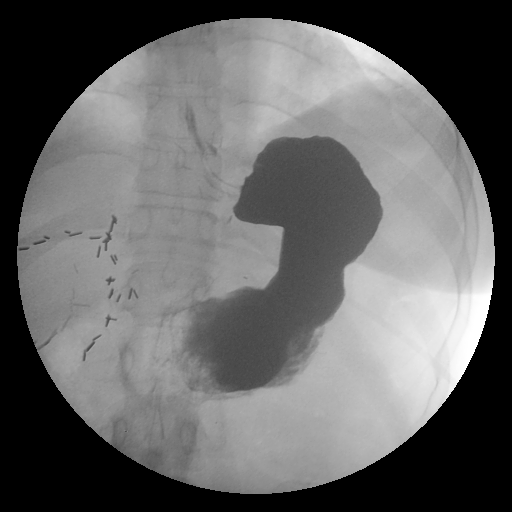

[Series 6: run · 1 of 1 slices shown (5 of 19)]
[im 1/1]
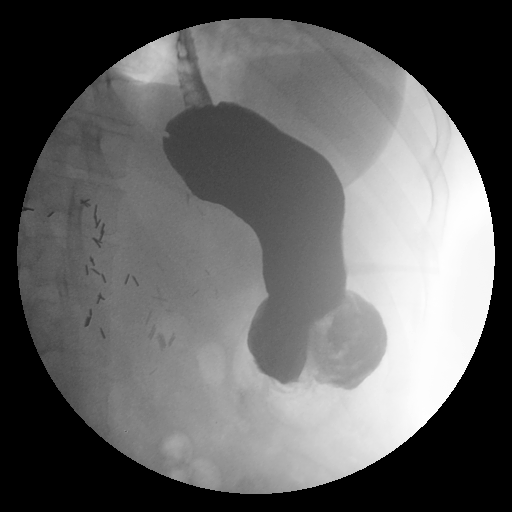

[Series 7: run · 1 of 1 slices shown (6 of 19)]
[im 1/1]
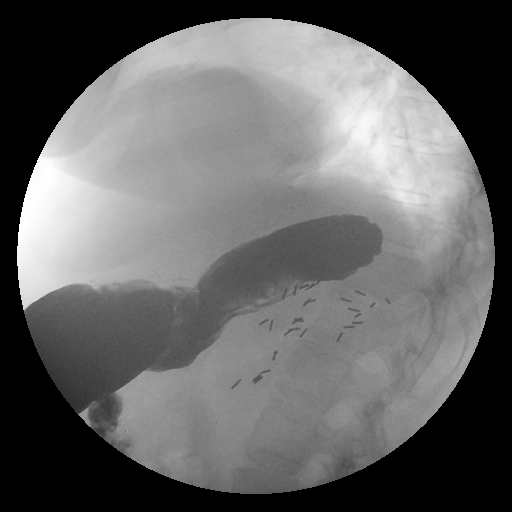

[Series 9: run · 1 of 1 slices shown (7 of 19)]
[im 1/1]
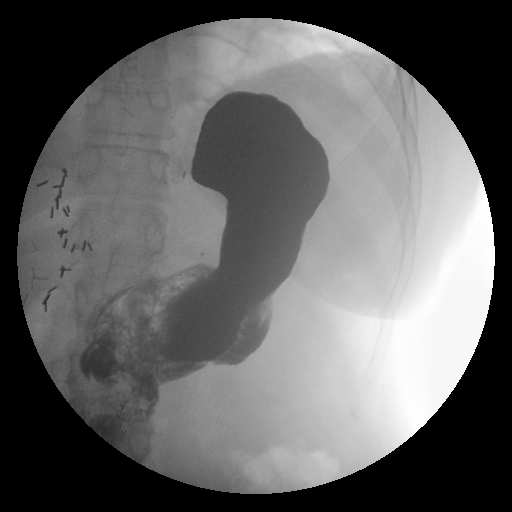

[Series 10: run · 1 of 1 slices shown (8 of 19)]
[im 1/1]
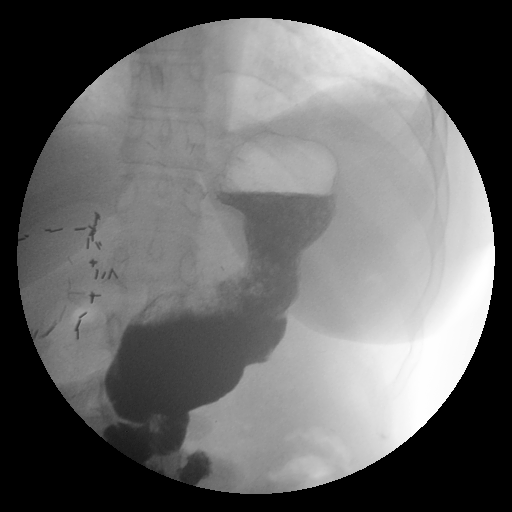

[Series 11: run · 1 of 1 slices shown (9 of 19)]
[im 1/1]
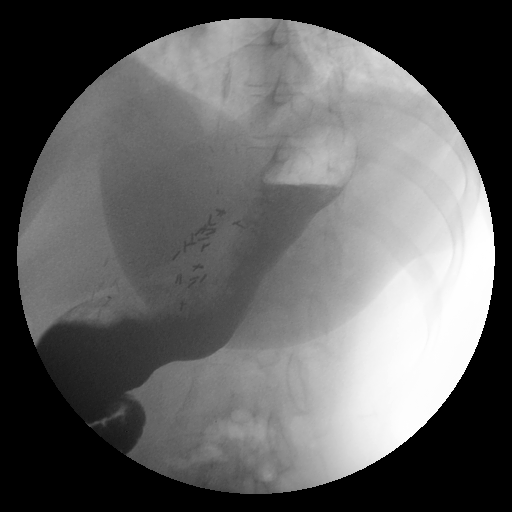

[Series 13: run · 1 of 1 slices shown (10 of 19)]
[im 1/1]
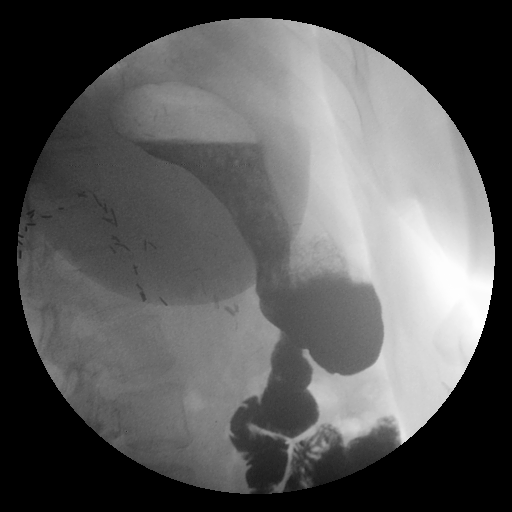

[Series 14: run · 1 of 1 slices shown (11 of 19)]
[im 1/1]
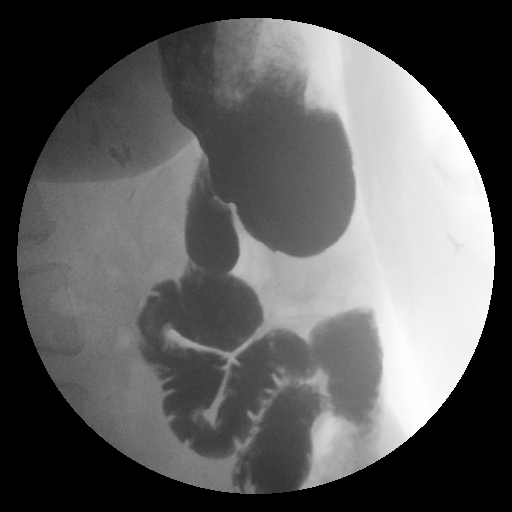

[Series 15: run · 1 of 1 slices shown (12 of 19)]
[im 1/1]
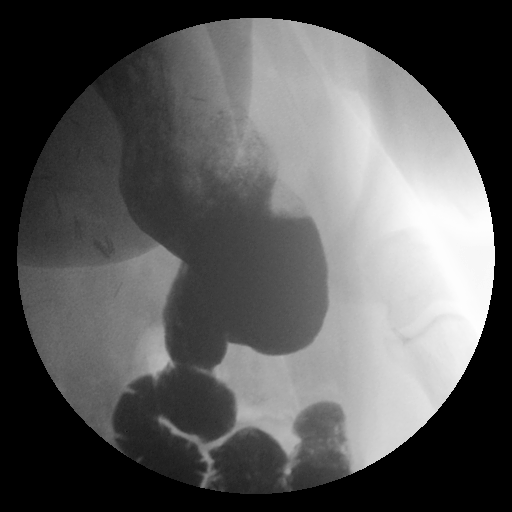

[Series 16: run · 1 of 1 slices shown (13 of 19)]
[im 1/1]
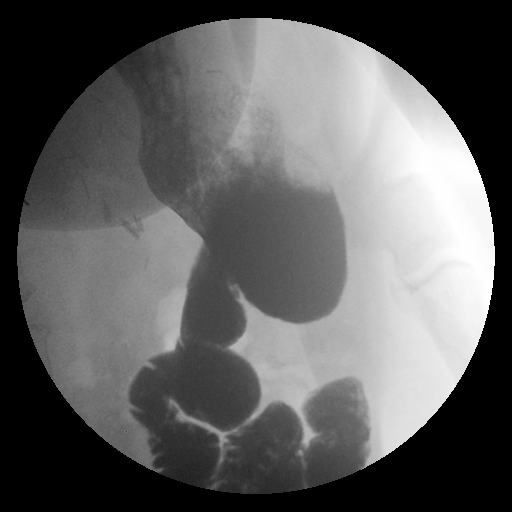

[Series 18: run · 1 of 1 slices shown (14 of 19)]
[im 1/1]
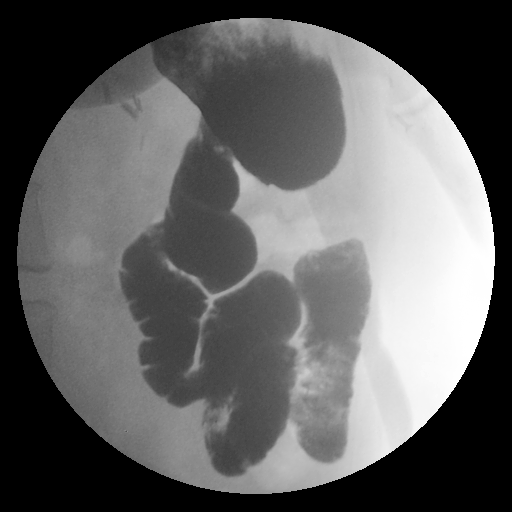

[Series 19: run · 1 of 1 slices shown (15 of 19)]
[im 1/1]
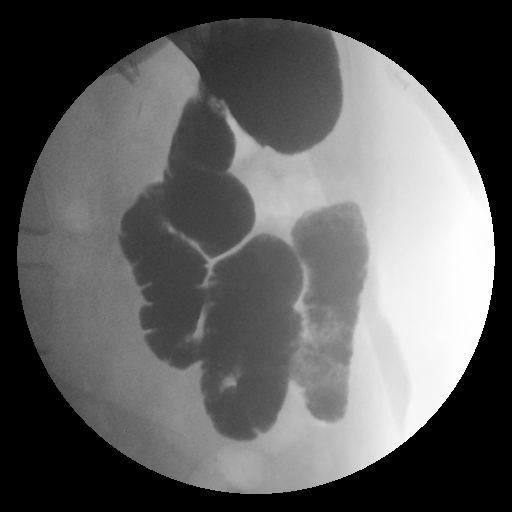

[Series 20: run · 1 of 1 slices shown (16 of 19)]
[im 1/1]
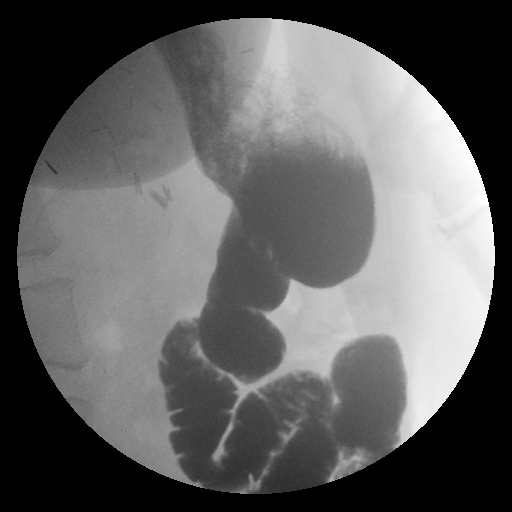

[Series 21: run · 1 of 1 slices shown (17 of 19)]
[im 1/1]
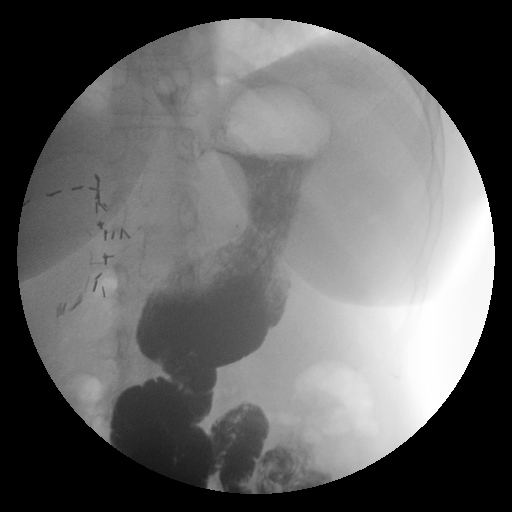

[Series 23: run · 1 of 1 slices shown (18 of 19)]
[im 1/1]
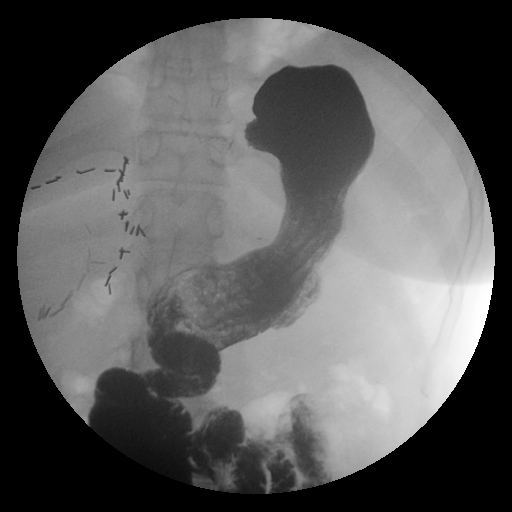

[Series 24: run · 1 of 1 slices shown (19 of 19)]
[im 1/1]
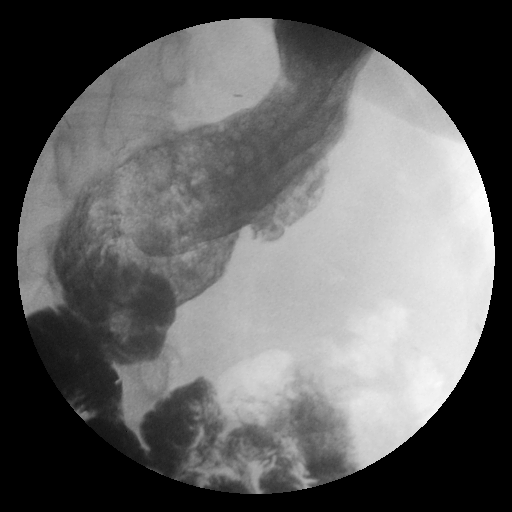

[19 of 24 positions shown; findings below may reference images not displayed]

FINDINGS: Normal esophageal distention and motility.

No esophageal mass or stricture.

No gastroesophageal reflux or hiatal hernia seen during exam.

Post antrectomy with gastrojejunostomy.

Stomach distends normally without gross mass or ulceration.

Patent gastrojejunostomy anastomosis with free spillage of contrast
from the stomach into normal-appearing proximal jejunal loops.

Emptying is clearly facilitated by upright positioning rather than
supine.

Visualize jejunal loops normal in appearance with normal diameter
and mucosal fold pattern.

No persistent intraluminal filling defects.

No anastomotic stricture or ulcer identified.

Normal bowel gas pattern on scout image.

Osseous demineralization.
IMPRESSION: Post Billroth II antrectomy and gastrojejunostomy.

Widely patent gastrojejunostomy anastomosis without anastomotic
stricture or ulcer.

No acute abnormalities identified.

## 2015-08-30 IMAGING — DX DG ABDOMEN ACUTE W/ 1V CHEST
4 series · 4 of 4 positions shown · non-contrast
Comparison: Two-view chest x-ray 03/14/2014.

CLINICAL DATA: Mid abdominal pain, vomiting and diarrhea over the
past 5 days. Multiple prior abdominal surgeries. Current history of
hypertension, lupus.

EXAM:
ACUTE ABDOMEN SERIES (ABDOMEN 2 VIEW & CHEST 1 VIEW)

[chest pa]
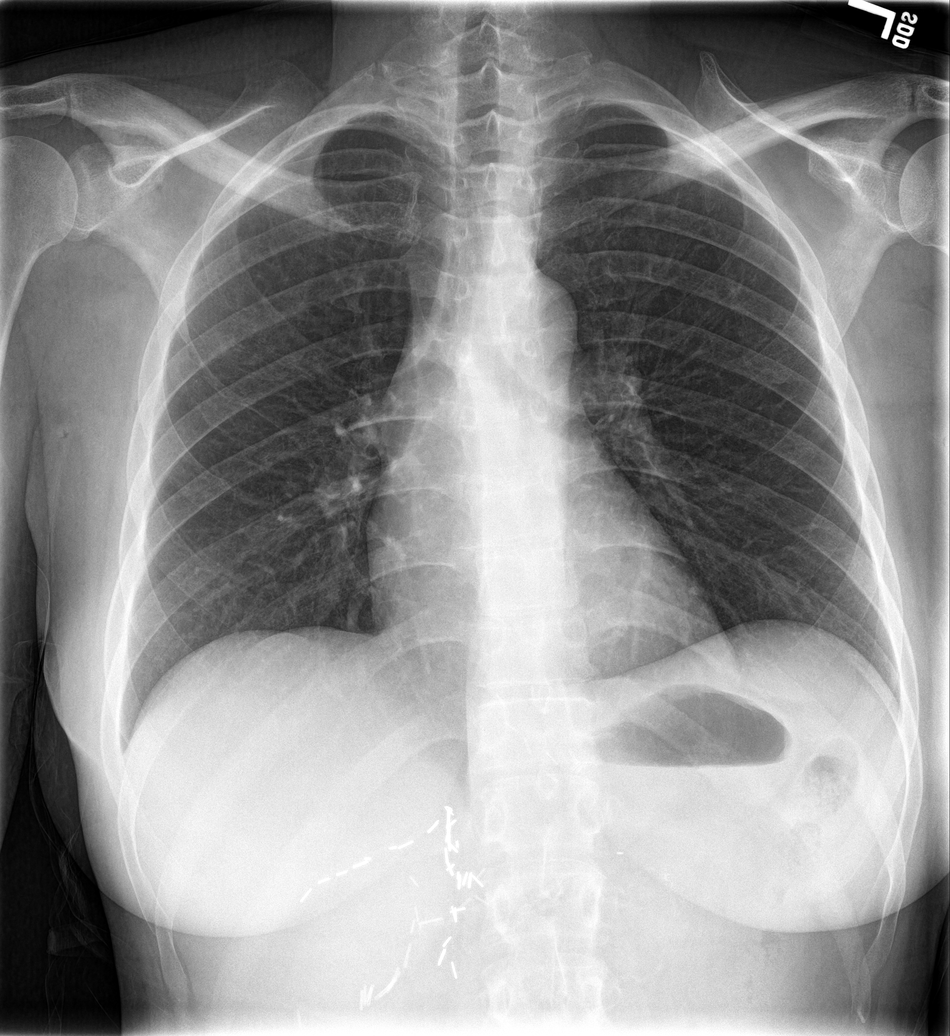

[abdomen erect]
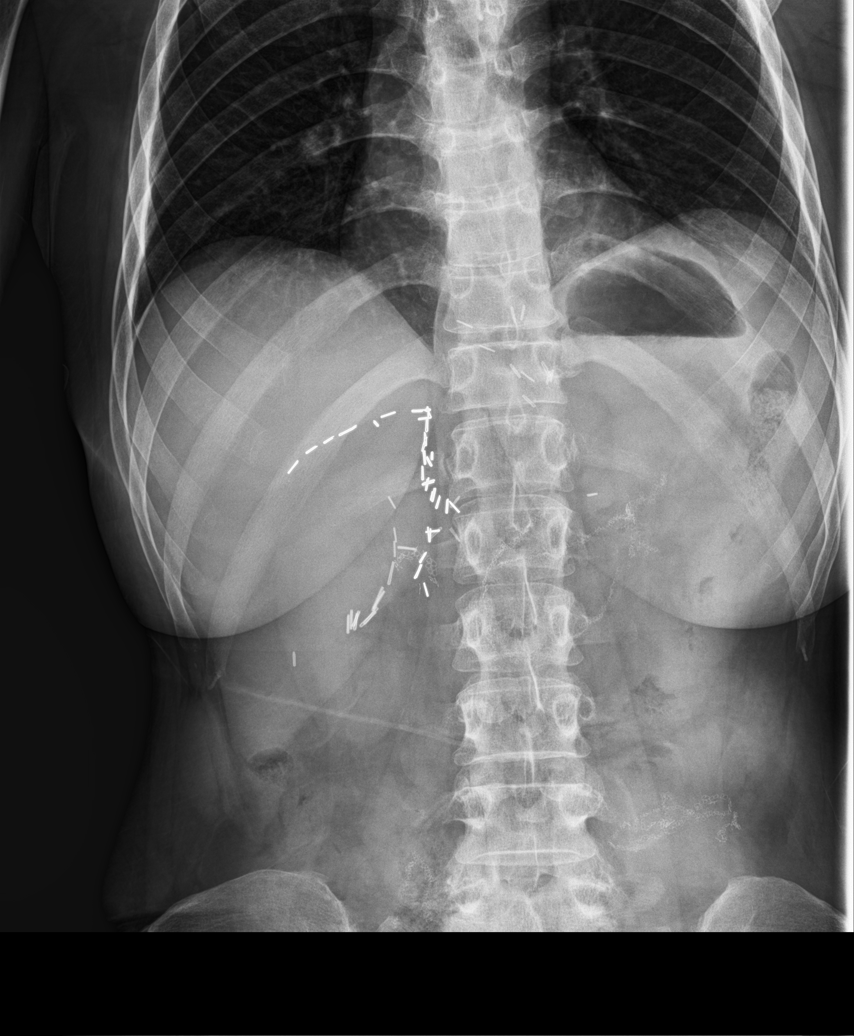

[abdomen supine (1 of 2)]
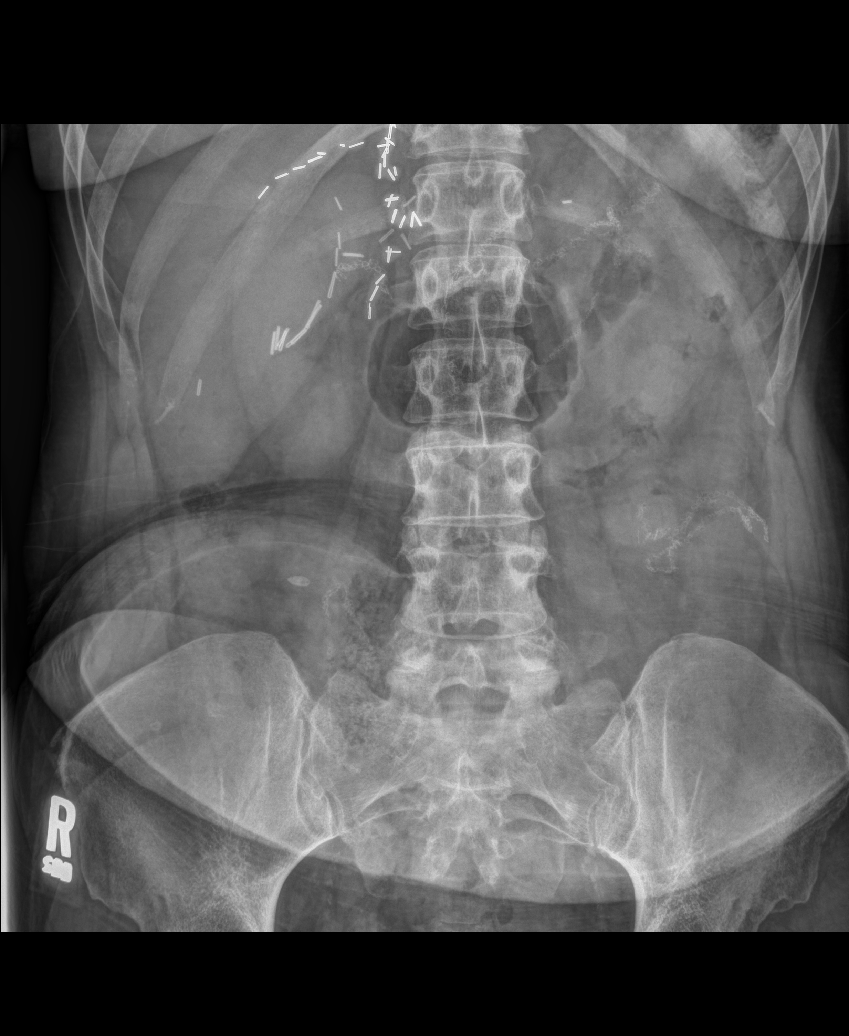

[abdomen supine (2 of 2)]
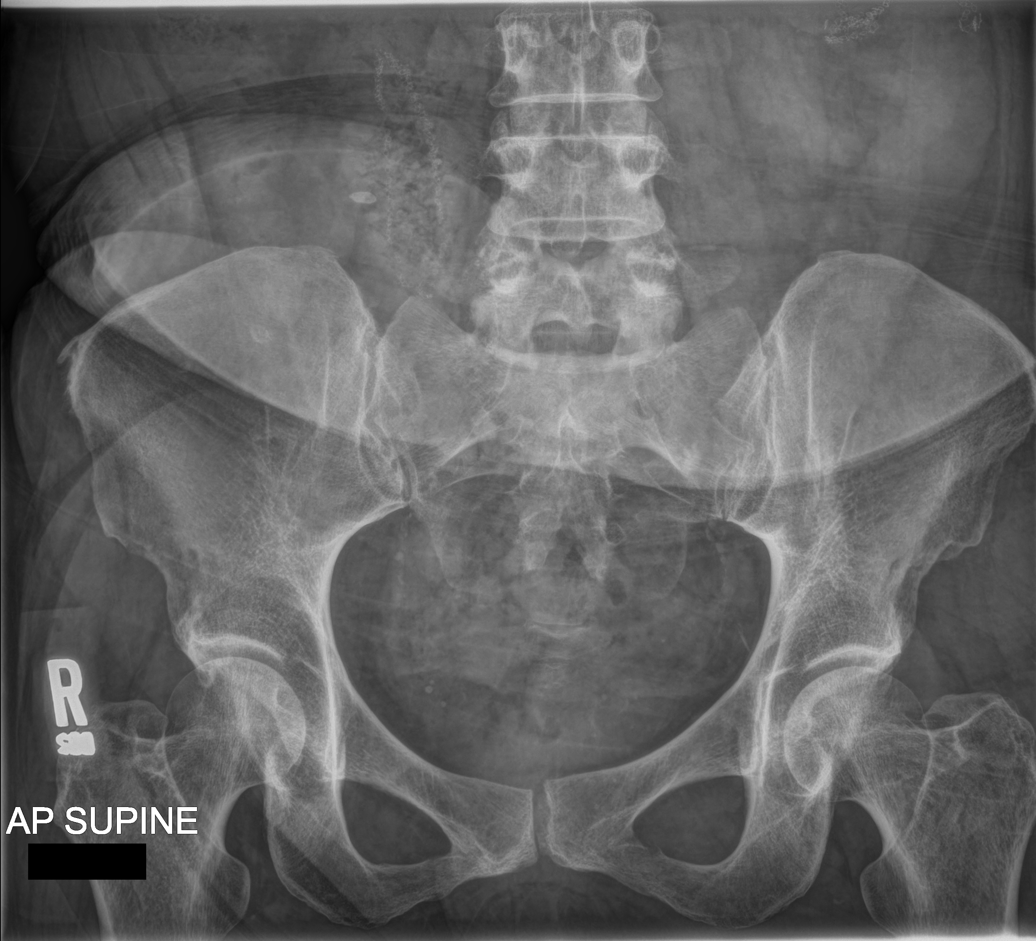

[4 of 4 positions shown; findings below may reference images not displayed]

Acute abdomen series
02/02/2014. CT chest abdomen and pelvis 03/02/2014. Multiple prior
CT abdomen and pelvis examinations prior to that.
FINDINGS: Numerous surgical clips in the right upper quadrant abdomen and
anastomotic suture material in the upper and mid abdomen. Bowel gas
pattern normal without evidence of obstruction or significant ileus.
Expected stool burden in the colon. No evidence of free
intraperitoneal air or significant air-fluid levels on the erect
image. No visible opaque urinary tract calculi. Phleboliths in the
pelvis. Injection granulomata in the right buttock. Regional
skeleton unremarkable.

Cardiomediastinal silhouette unremarkable, unchanged. Lungs clear.
Bronchovascular markings normal. Pulmonary vascularity normal. No
visible pleural effusions. No pneumothorax.
IMPRESSION: 1. No acute abdominal abnormality.
2.  No acute cardiopulmonary disease.

## 2015-09-01 IMAGING — DX DG CHEST 2V
2 series · 2 of 2 positions shown · non-contrast
Comparison: 02/22/2015

CLINICAL DATA: Cough, congestion, and left-sided chest pain.

EXAM:
CHEST  2 VIEW

[chest pa]
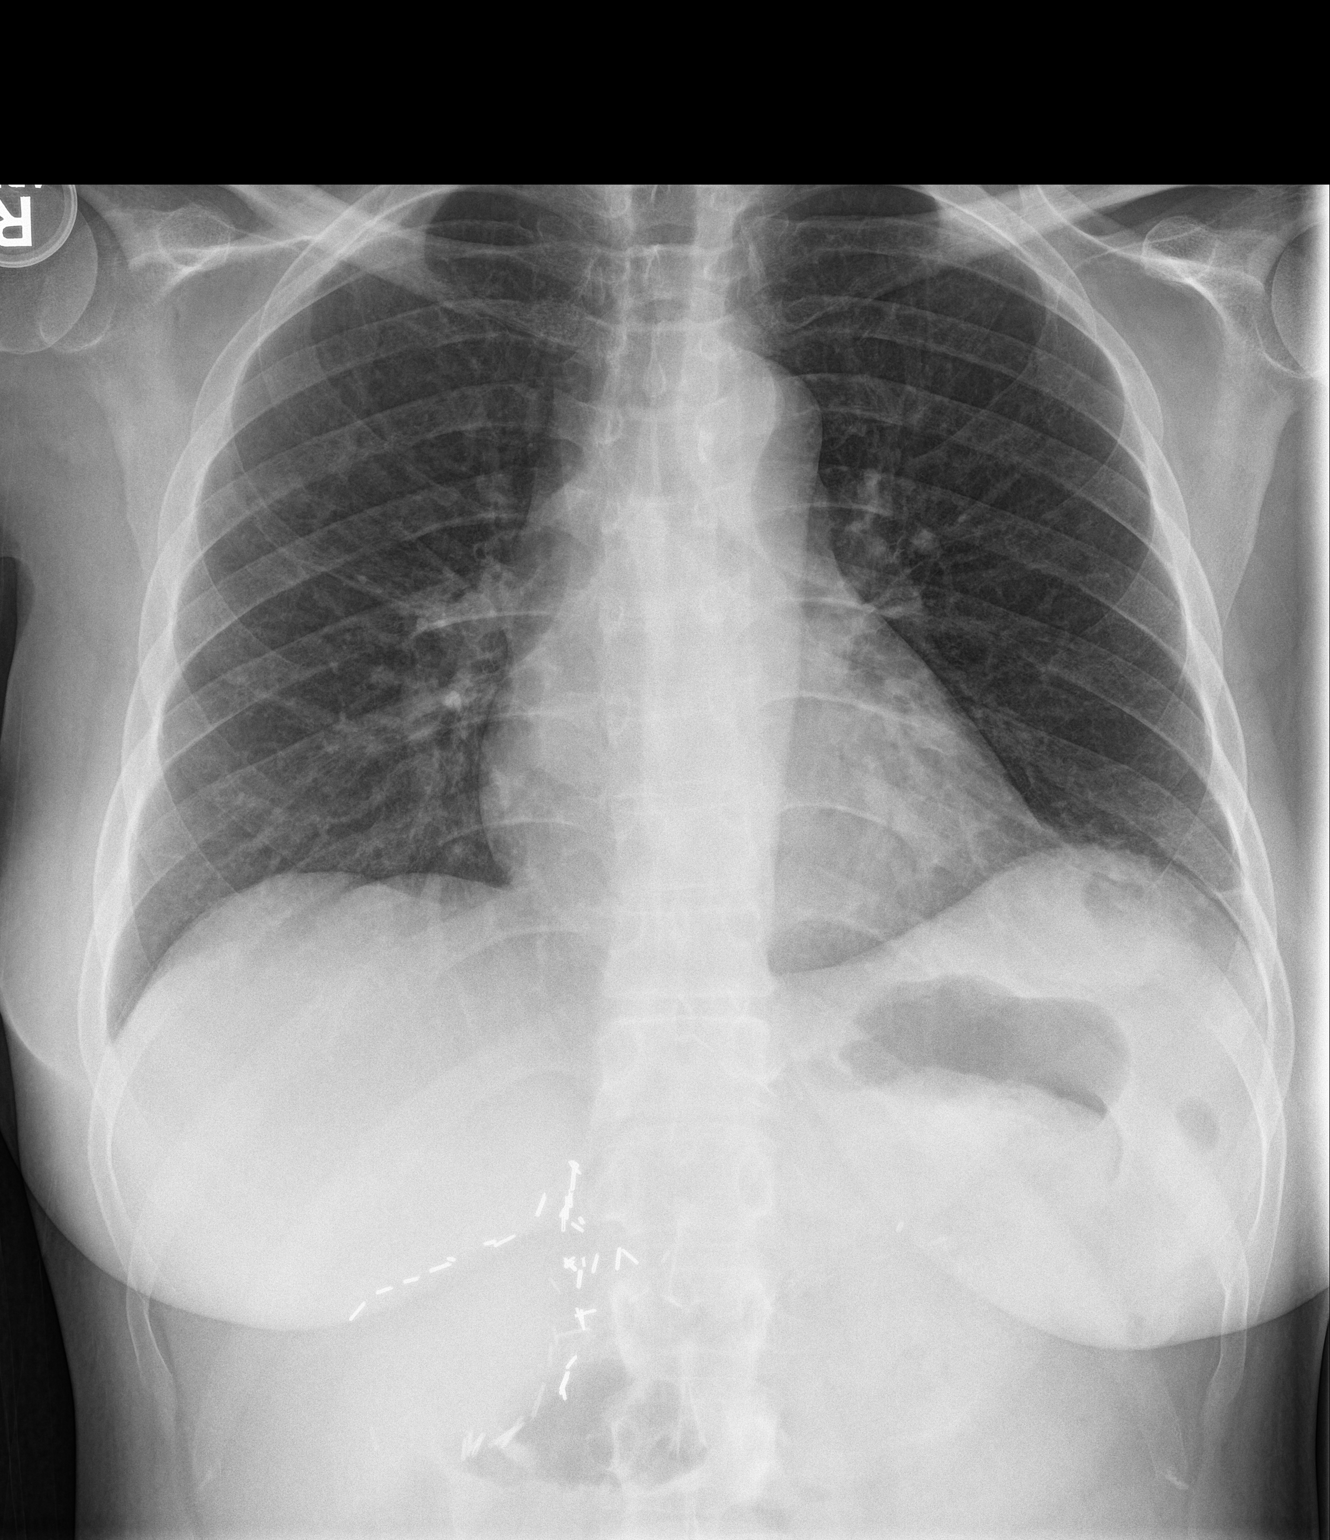

[chest lat]
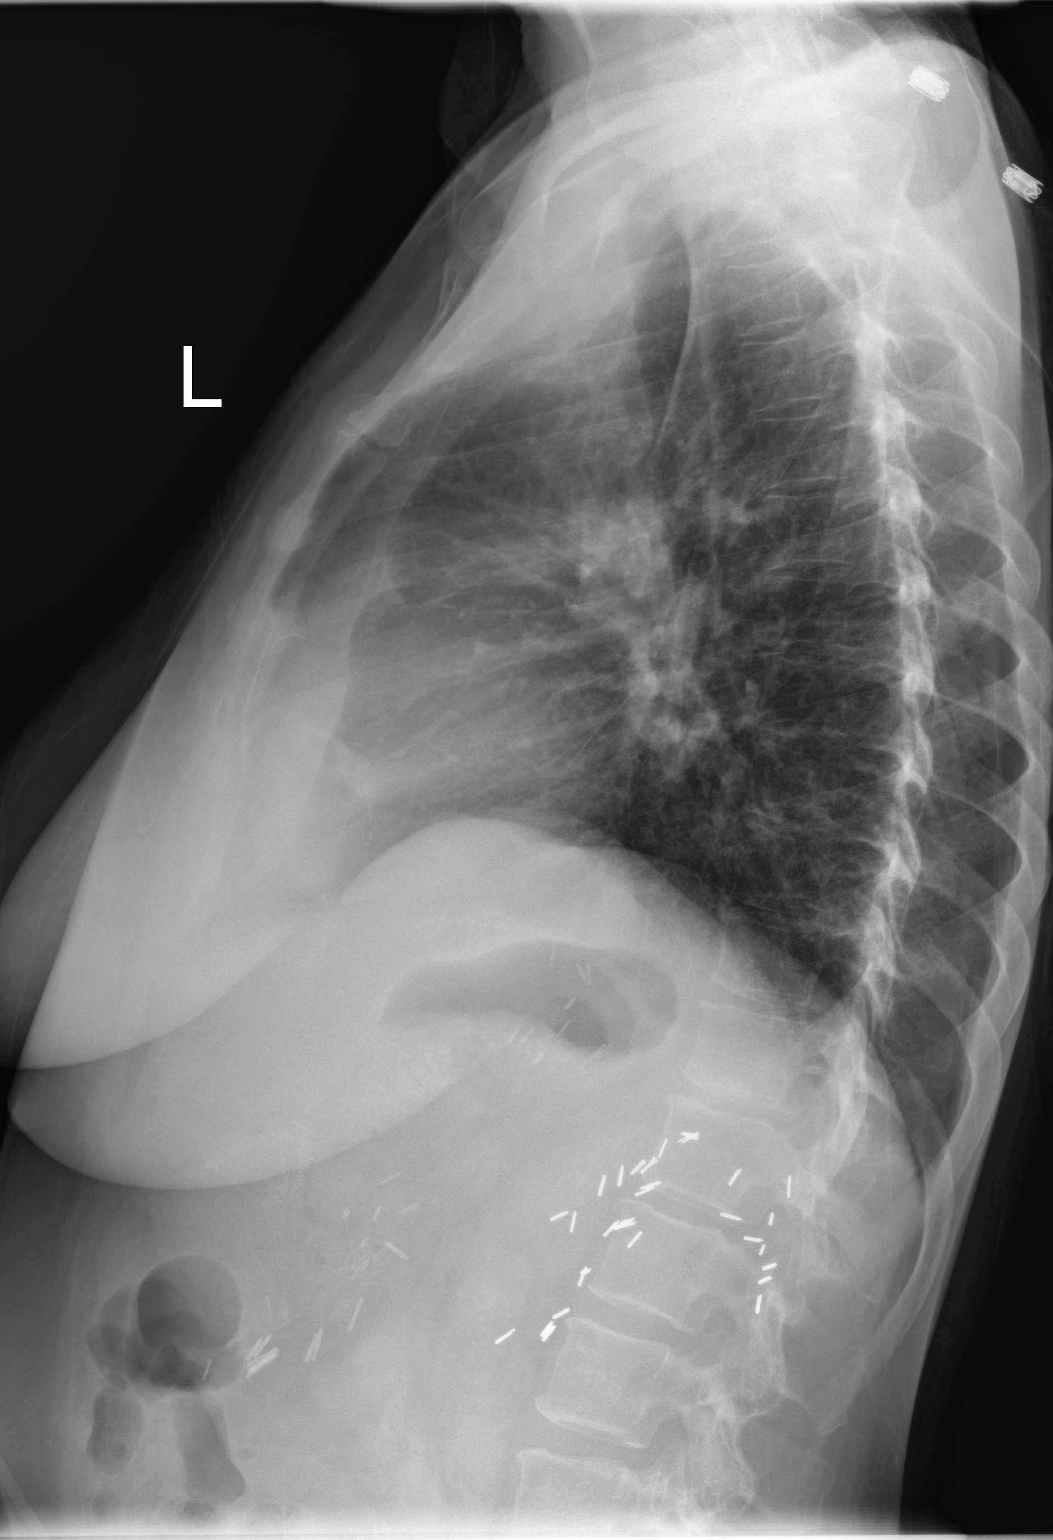

[2 of 2 positions shown; findings below may reference images not displayed]

FINDINGS: Mild atelectasis at the left base, new from prior. Indistinct
opacity in the right mid lung is scarring based on chest CT from
03/02/2014. There is no edema, consolidation, effusion, or
pneumothorax. Normal heart size and aortic contours. Extensive
remote intra-abdominal surgery, including cholecystectomy and right
adrenalectomy.
IMPRESSION: Minimal atelectasis at the left base.  No edema or pneumonia.

## 2015-09-25 ENCOUNTER — Emergency Department (HOSPITAL_COMMUNITY)
Admission: EM | Admit: 2015-09-25 | Discharge: 2015-09-25 | Disposition: A | Discharge status: Home / Self Care | Payer: Medicare HMO | Attending: Emergency Medicine | Admitting: Emergency Medicine

## 2015-09-25 ENCOUNTER — Emergency Department (HOSPITAL_COMMUNITY): Payer: Medicare HMO

## 2015-09-25 ENCOUNTER — Encounter (HOSPITAL_COMMUNITY): Payer: Self-pay | Admitting: Emergency Medicine

## 2015-09-25 DIAGNOSIS — R51 Headache: Secondary | ICD-10-CM | POA: Insufficient documentation

## 2015-09-25 DIAGNOSIS — J029 Acute pharyngitis, unspecified: Secondary | ICD-10-CM | POA: Diagnosis not present

## 2015-09-25 DIAGNOSIS — R05 Cough: Secondary | ICD-10-CM | POA: Diagnosis not present

## 2015-09-25 DIAGNOSIS — F17210 Nicotine dependence, cigarettes, uncomplicated: Secondary | ICD-10-CM | POA: Insufficient documentation

## 2015-09-25 DIAGNOSIS — R69 Illness, unspecified: Secondary | ICD-10-CM | POA: Diagnosis not present

## 2015-09-25 DIAGNOSIS — M329 Systemic lupus erythematosus, unspecified: Secondary | ICD-10-CM | POA: Diagnosis not present

## 2015-09-25 DIAGNOSIS — H9209 Otalgia, unspecified ear: Secondary | ICD-10-CM | POA: Diagnosis not present

## 2015-09-25 DIAGNOSIS — Z79899 Other long term (current) drug therapy: Secondary | ICD-10-CM | POA: Diagnosis not present

## 2015-09-25 DIAGNOSIS — R0602 Shortness of breath: Secondary | ICD-10-CM | POA: Diagnosis not present

## 2015-09-25 DIAGNOSIS — I10 Essential (primary) hypertension: Secondary | ICD-10-CM | POA: Insufficient documentation

## 2015-09-25 MED ORDER — HYDROCODONE-HOMATROPINE 5-1.5 MG/5ML PO SYRP: 5.0000 mL | ORAL_SOLUTION | Freq: Four times a day (QID) | ORAL | Status: DC | PRN

## 2015-09-25 MED ORDER — DEXAMETHASONE SODIUM PHOSPHATE 4 MG/ML IJ SOLN
8.0000 mg | Freq: Once | INTRAMUSCULAR | Status: AC
  Administered 2015-09-25: 8 mg via INTRAMUSCULAR
  Filled 2015-09-25: qty 2

## 2015-09-25 MED ORDER — DEXAMETHASONE 4 MG PO TABS: 4.0000 mg | ORAL_TABLET | Freq: Two times a day (BID) | ORAL | Status: DC

## 2015-09-25 MED ORDER — HYDROCOD POLST-CPM POLST ER 10-8 MG/5ML PO SUER
5.0000 mL | Freq: Once | ORAL | Status: AC
  Administered 2015-09-25: 5 mL via ORAL
  Filled 2015-09-25: qty 5

## 2015-09-25 NOTE — ED Provider Notes (Addendum)
CSN: [id number]     Arrival date & time 09/25/15  1935 History   First MD Initiated Contact with Patient 09/25/15 1958     Chief Complaint  Patient presents with  . Otalgia  . Sore Throat     (Consider location/radiation/quality/duration/timing/severity/associated sxs/prior Treatment) Patient also complains of a cough that causes pain in the chest. The pain is only with cough, is not associated with shortness of breath, sweats, or loss of consciousness.   The history is provided by the patient.  Sore Throat  This is a new problem. The current episode started in the past 7 days. The problem occurs intermittently. The problem has been gradually worsening. Associated symptoms include coughing, headaches and a sore throat. The symptoms are aggravated by swallowing. She has tried acetaminophen (OTC medication) for the symptoms.    Past Medical History  Diagnosis Date  . Lupus ([place])   . 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 Hypertension   . Abscess     soft tissue  . Suicide attempt ([place])   . Blood transfusion without reported diagnosis   . Chronic abdominal pain   . 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 Adrenal mass ([place])   . Anxiety   . Depression   . GERD (gastroesophageal reflux disease)   . Sickle cell trait ([place])   . Gastroparesis Nov 2015  . Nausea and vomiting     chronic, recurrent  . Alcohol abuse   . History of Billroth II operation   . Lung nodule < 6cm on CT 04/25/2014  . Hemorrhoid     internal large  . Diverticulosis     colonoscopy 04/2014 moderat pan colonic  . Colon polyp     colonoscopy 04/2014  . Gastritis     EGD 05/2014  . Hiatal hernia   . Schatzki's ring     patent per EGD 04/2014  . Lung nodule     CT 02/2014 needs repeat 1 month   Past Surgical History  Procedure Laterality Date  . Abdominal surgery    . Abdominal hysterectomy  2013    [place]  . Debridement of abdominal wall abscess N/A 02/08/2013    Procedure:  DEBRIDEMENT OF ABDOMINAL WALL ABSCESS;  Surgeon: Jamesetta So, MD;  Location: [facility];  Service: General;  Laterality: N/A;  . Billroth ii procedure       [place], first 2000, 2005/2006.  . Colonoscopy      in [place]  . Cholecystectomy    . Colonoscopy with propofol N/A 05/20/2013    Dr.Rourk- inadequate prep, normal appearing rectum, grossly normal colon aside from pancolonic diverticula, normal terminal ileum bx= unremarkable colonic mucosa. Due for early interval 2016.   Marland Kitchen Esophagogastroduodenoscopy (egd) with propofol N/A 05/20/2013    Dr.Rourk- s/p prior gastric surgery with normal esophagus, residual gastric mucosa and patent efferent limb  . Biopsy  05/20/2013    Procedure: BIOPSIES OF ASCENDING AND SIGMOID COLON;  Surgeon: Daneil Dolin, MD;  Location: [facility];  Service: Endoscopy;;  . Wound exploration Right 06/24/2013    Procedure: exploration of traumatic wound right wrist;  Surgeon: Tennis Must, MD;  Location: [place];  Service: Orthopedics;  Laterality: Right;  . Tendon repar Right     wrist  . Adrenal turmor removal    . Esophagogastroduodenoscopy (egd) with propofol N/A 02/03/2014    Dr. Gala Romney:  s/p hemigastrectomy with retained gastric contents. Residual gastric mucosa and efferent limb appeared normal  otherwise. Query gastroparesis.   . Colonoscopy with propofol N/A 04/26/2014    [id number] ileum/one colon polyp removed/moderate pan-colonic diverticulosis/large internal hemorrhoids  . Esophagogastroduodenoscopy (egd) with propofol N/A 04/26/2014    [id number] ring/small HH/mild non-erosive gasrtitis/normal anastomosis  . Biopsy  04/26/2014    Procedure: BIOPSIES;  Surgeon: Danie Binder, MD;  Location: [facility];  Service: Endoscopy;;  . Colonoscopy with propofol N/A 12/23/2014    Dr.Rourk- minimal internal hemorrhoids, pancolonic diverticulosis  . Esophagogastroduodenoscopy (egd) with propofol N/A 12/23/2014    Dr.Rourk- s/p prior hemigastrctomy, active oozing from anastomotic  suture site, hemostasis achieved  . Agile capsule N/A 01/05/2015    Procedure: AGILE CAPSULE;  Surgeon: Daneil Dolin, MD;  Location: [facility];  Service: Endoscopy;  Laterality: N/A;  0700   Family History  Problem Relation Age of Onset  . Brain cancer Son   . Schizophrenia Son   . Cancer Son     brain  . Breast cancer Maternal Aunt   . Bipolar disorder Maternal Aunt   . Drug abuse Maternal Aunt   . Colon cancer Maternal Grandmother     late 43s, early 97s  . Lung cancer Father   . Cancer Father     mets  . Liver disease Neg Hx   . Drug abuse Mother   . Drug abuse Sister   . Drug abuse Brother   . Bipolar disorder Paternal Grandfather   . Bipolar disorder Cousin    Social History  Substance Use Topics  . Smoking status: Current Every Day Smoker -- 0.50 packs/day for 35 years    Types: Cigarettes  . Smokeless tobacco: Never Used  . Alcohol Use: 7.2 oz/week    12 Cans of beer, 0 Standard drinks or equivalent per week     Comment: 5 12 oz beers a day   OB History    No data available     Review of Systems  HENT: Positive for ear pain and sore throat.   Respiratory: Positive for cough.   Neurological: Positive for headaches.  All other systems reviewed and are negative.     Allergies  Codeine and Morphine and related  Home Medications   Prior to Admission medications   Medication Sig Start Date End Date Taking? Authorizing Provider  chlordiazePOXIDE (LIBRIUM) 25 MG capsule 50mg  PO TID x 1D, then 25-50mg  PO BID X 1D, then 25-50mg  PO QD X 1D,   May continue the librium one pill daily if needed Patient not taking: Reported on 06/12/2015 03/29/15   Milton Ferguson, MD  fluticasone [facility]) 50 MCG/ACT nasal spray Place 1 spray into both nostrils daily.    Historical Provider, MD  hydrocortisone (ANUSOL-HC) 25 MG suppository Place 1 suppository (25 mg total) rectally 2 (two) times daily. Patient not taking: Reported on 03/13/2015 12/25/14   Rosita Fire, MD   lisinopril (PRINIVIL,ZESTRIL) 10 MG tablet Take 10 mg by mouth daily.    Historical Provider, MD  metoCLOPramide (REGLAN) 10 MG tablet Take 1 tablet (10 mg total) by mouth 4 (four) times daily -  before meals and at bedtime. Patient not taking: Reported on 03/29/2015 04/06/14   Radene Gunning, NP  ondansetron (ZOFRAN ODT) 8 MG disintegrating tablet Take 1 tablet (8 mg total) by mouth every 8 (eight) hours as needed for nausea or vomiting. Patient not taking: Reported on 03/13/2015 02/10/15   Evalee Jefferson, PA-C  ondansetron (ZOFRAN) 4 MG tablet Take 1 tablet (4 mg total) by mouth every 6 (six)  hours. 06/23/15   Carlis Stable, NP  pantoprazole (PROTONIX) 40 MG tablet Take 1 tablet (40 mg total) by mouth 2 (two) times daily before a meal. Patient taking differently: Take 40 mg by mouth daily.  09/04/14   Daleen Bo, MD  promethazine (PHENERGAN) 25 MG tablet Take 1 tablet (25 mg total) by mouth every 6 (six) hours as needed for nausea or vomiting. Patient not taking: Reported on 03/13/2015 01/07/15   Tammy Triplett, PA-C  sucralfate (CARAFATE) 1 G tablet Take 1 g by mouth 4 (four) times daily.    Historical Provider, MD  traMADol (ULTRAM) 50 MG tablet Take 1 tablet (50 mg total) by mouth 3 (three) times daily as needed. Patient not taking: Reported on 06/12/2015 03/13/15   Virgel Manifold, MD   BP 121/87 mmHg  Pulse 113  Temp(Src) 98.9 F (37.2 C) (Oral)  Resp 20  Ht 5\' 3"  (1.6 m)  Wt 59.875 kg  BMI 23.39 kg/m2  SpO2 98% Physical Exam  Constitutional: She is oriented to person, place, and time. She appears well-developed and well-nourished.  Non-toxic appearance.  HENT:  Head: Normocephalic.  Right Ear: Tympanic membrane and external ear normal. No drainage. Tympanic membrane is not injected, not scarred, not perforated, not erythematous and not bulging.  Left Ear: Tympanic membrane and external ear normal. No drainage. Tympanic membrane is not injected, not scarred, not perforated, not erythematous  and not bulging.  Eyes: EOM and lids are normal. Pupils are equal, round, and reactive to light.  Neck: Normal range of motion. Neck supple. Carotid bruit is not present.  Cardiovascular: Normal rate, regular rhythm, normal heart sounds, intact distal pulses and normal pulses.   Pulmonary/Chest: Breath sounds normal. No respiratory distress.  Course breath sounds. Symmetrical rise and fall of the chest.  Abdominal: Soft. Bowel sounds are normal. There is no tenderness. There is no guarding.  Musculoskeletal: Normal range of motion.  Lymphadenopathy:       Head (right side): No submandibular adenopathy present.       Head (left side): No submandibular adenopathy present.    She has no cervical adenopathy.  Neurological: She is alert and oriented to person, place, and time. She has normal strength. No cranial nerve deficit or sensory deficit.  Skin: Skin is warm and dry.  Psychiatric: She has a normal mood and affect. Her speech is normal.  Nursing note and vitals reviewed.   ED Course  Procedures (including critical care time) Labs Review Labs Reviewed - No data to display  Imaging Review No results found. I have personally reviewed and evaluated these images and lab results as part of my medical decision-making.   EKG Interpretation None      MDM Heart rate is elevated at 113. Vital signs are otherwise within normal limits. The pulse oximetry is 98% on room air. The patient has a cough during examination. Given her history of lupus, will check a chest x-ray to rule out pneumonitis, or other lung related issues.  Chest xray is negative for acute problem. Pt will be treated with decadron and hycodan for cough. Pt to increase fluids. She is to see her PCP for additional evaluation of her lupus.   Final diagnoses:  Pharyngitis  Lupus ([place])    **I have reviewed nursing notes, vital signs, and all appropriate lab and imaging results for this patient.Lily Kocher,  PA-C 09/26/15 [place], PA-C 10/11/15 1625  Ripley Fraise, MD 10/20/15 2110  Meryl Crutch  Marshell Levan, PA-C 11/05/15 [id number]  Ripley Fraise, MD 11/06/15 [phone]

## 2015-09-25 NOTE — ED Notes (Signed)
Pt verbalized understanding of no driving and to use caution within 4 hours of taking pain meds due to meds cause drowsiness 

## 2015-09-25 NOTE — ED Notes (Signed)
Pt states she has been having ear pain for about a month and a half and that has completed a round of antibiotics. Also c/o throat pain with dry throat making it difficult to swallow at times and excess mucus production.

## 2015-09-25 NOTE — ED Notes (Signed)
Pt with earpain to right ear for a month and half, has finished an antibiotics over a month ago for it, continued sore throat as well

## 2015-09-25 NOTE — Discharge Instructions (Signed)
Your vital signs are within normal limits. Your oxygen level is 98% on room air. Please use salt water gargles and Chloraseptic Spray to assist with your sore throat. Please use Decadron 2 times daily to help improve the inflammation. May use Hycodan every 6 hours as needed for cough. Hycodan may cause drowsiness, please do not drink alcohol, operate machines, drive a vehicle, or participate in activity requiring concentration when taking this medication. Please see Dr.Teoh 4 air nose and throat evaluation concerning your throat problem. Pharyngitis Pharyngitis is a sore throat (pharynx). There is redness, pain, and swelling of your throat. HOME CARE   Drink enough fluids to keep your pee (urine) clear or pale yellow.  Only take medicine as told by your doctor.  You may get sick again if you do not take medicine as told. Finish your medicines, even if you start to feel better.  Do not take aspirin.  Rest.  Rinse your mouth (gargle) with salt water ( tsp of salt per 1 qt of water) every 1-2 hours. This will help the pain.  If you are not at risk for choking, you can suck on hard candy or sore throat lozenges. GET HELP IF:  You have large, tender lumps on your neck.  You have a rash.  You cough up green, yellow-brown, or bloody spit. GET HELP RIGHT AWAY IF:   You have a stiff neck.  You drool or cannot swallow liquids.  You throw up (vomit) or are not able to keep medicine or liquids down.  You have very bad pain that does not go away with medicine.  You have problems breathing (not from a stuffy nose). MAKE SURE YOU:   Understand these instructions.  Will watch your condition.  Will get help right away if you are not doing well or get worse.   This information is not intended to replace advice given to you by your health care provider. Make sure you discuss any questions you have with your health care provider.   Document Released: 08/21/2007 Document Revised: 12/23/2012  Document Reviewed: 11/09/2012 Elsevier Interactive Patient Education [employer].

## 2015-09-27 IMAGING — DX DG ABDOMEN ACUTE W/ 1V CHEST
3 series · 3 of 3 positions shown · non-contrast
Comparison: 04/26/2014.

CLINICAL DATA: Rectal bleeding.  Initial encounter.

EXAM:
ACUTE ABDOMEN SERIES (ABDOMEN 2 VIEW & CHEST 1 VIEW)

[chest pa]
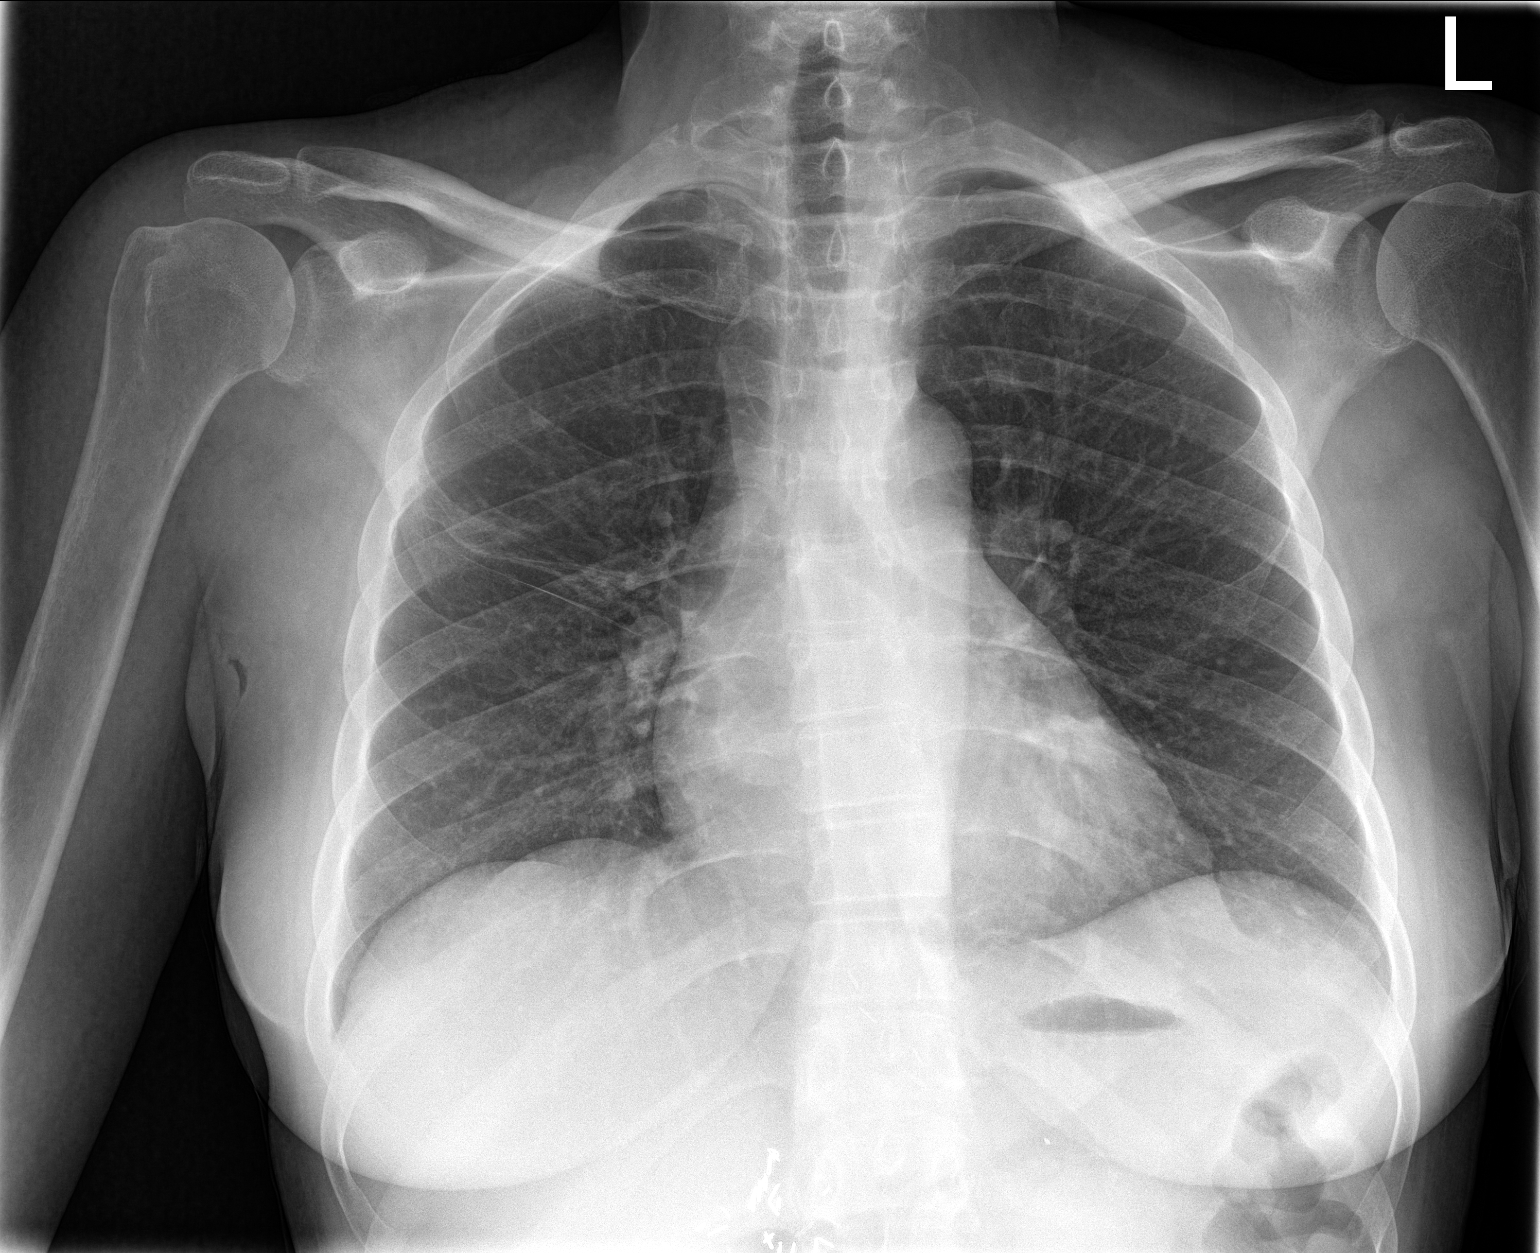

[abdomen erect]
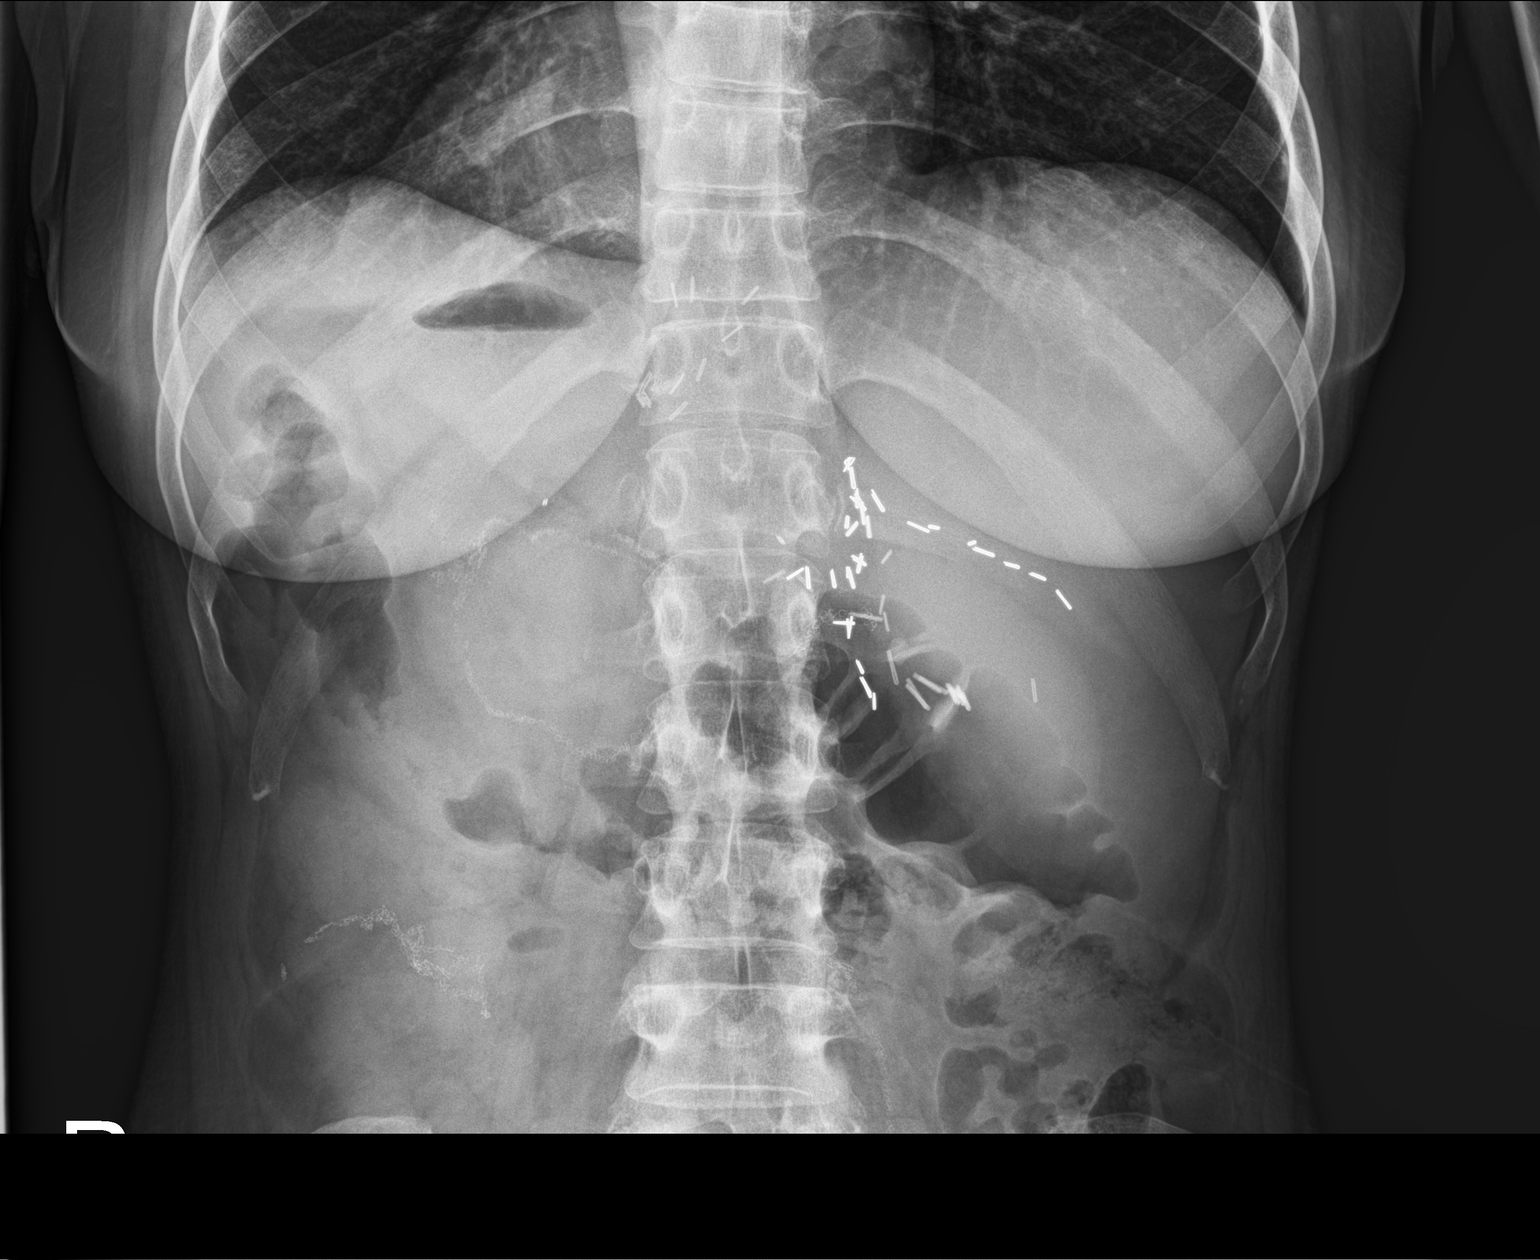

[abdomen supine]
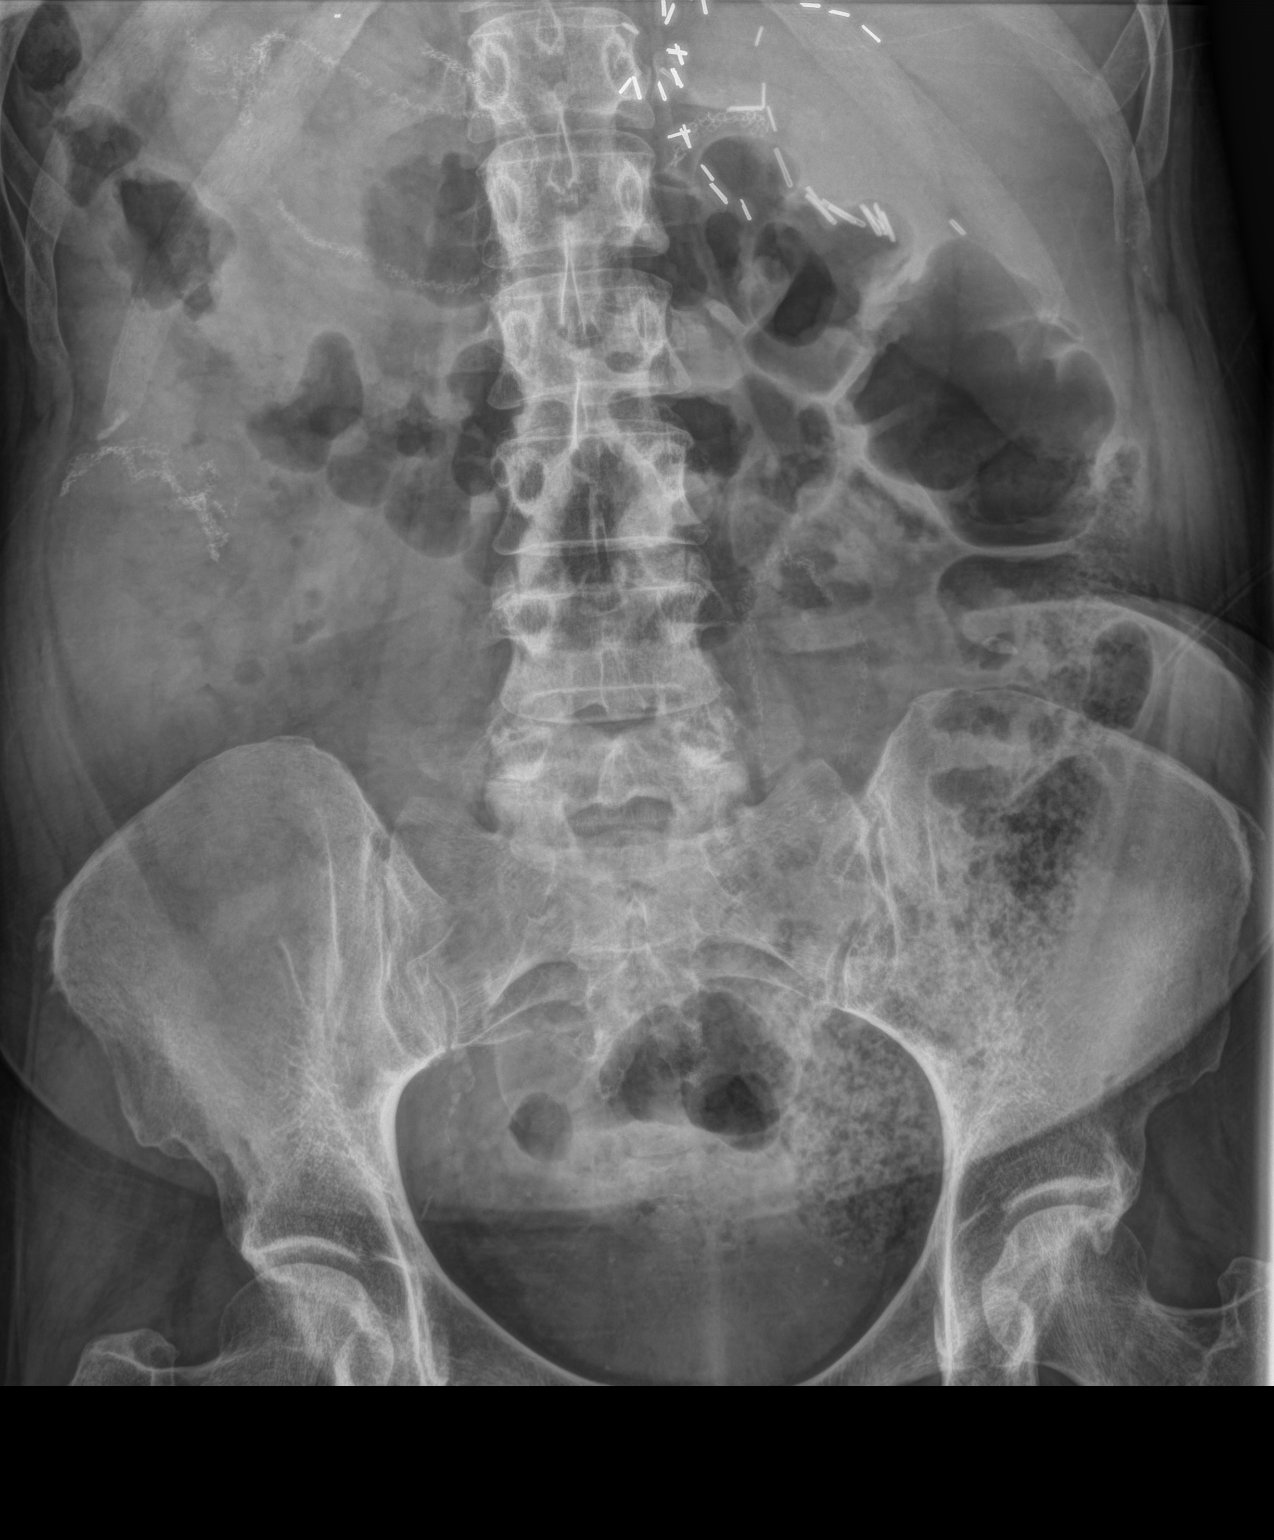

[3 of 3 positions shown; findings below may reference images not displayed]

FINDINGS: No acute cardiopulmonary disease. Linear density is present in the
RIGHT upper lobe adjacent to the major fissure, likely representing
atelectasis or scarring. Cardiopericardial silhouette within normal
limits. No free air underneath the hemidiaphragms.

Extensive postsurgical changes are present in the abdomen with bowel
staples and surgical clips. Bowel gas pattern is within normal
limits. Large amount of stool is present along the expected location
of the descending colon.
IMPRESSION: No acute abnormality. Extensive postsurgical changes in the abdomen
without evidence of bowel obstruction.

## 2015-09-27 IMAGING — CT CT ABD-PELV W/ CM
2 of 5 series · 15 of 46 positions shown, 17 images · IV contrast (Omnipaque 300)
Comparison: 03/02/2014

CLINICAL DATA: Generalized abdominal pain. Vomiting. Rectal
bleeding. Diverticulosis.

EXAM:
CT ABDOMEN AND PELVIS WITH CONTRAST
TECHNIQUE: Multidetector CT imaging of the abdomen and pelvis was performed
using the standard protocol following bolus administration of
intravenous contrast.
CONTRAST:  100mL OMNIPAQUE IOHEXOL 300 MG/ML  SOLN

[Series 2: abd_pel_with 5.0 b40f · axial · 0.64mm/px · z∈[-408,+6]mm · 12 of 95 slices shown, 14 images]
[im 6/95  soft-tissue]
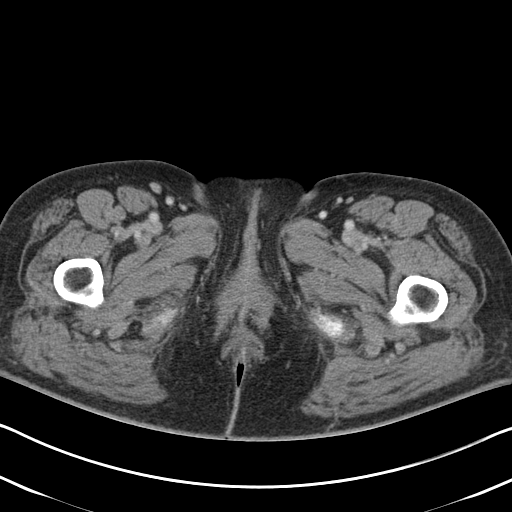
[im 6/95  bone]
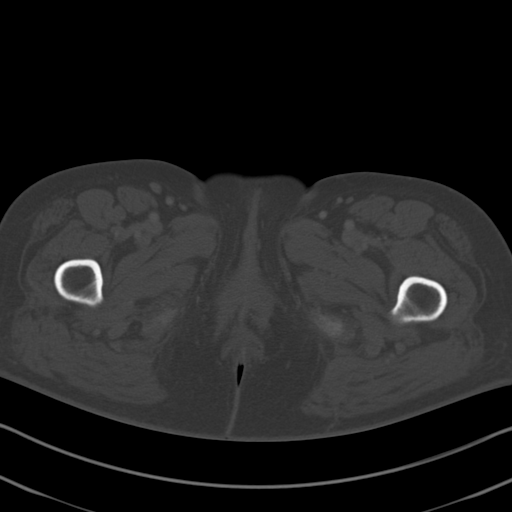
[im 16/95  soft-tissue]
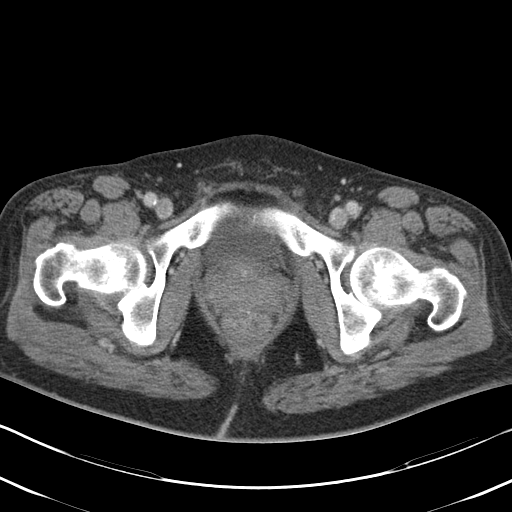
[im 21/95  soft-tissue]
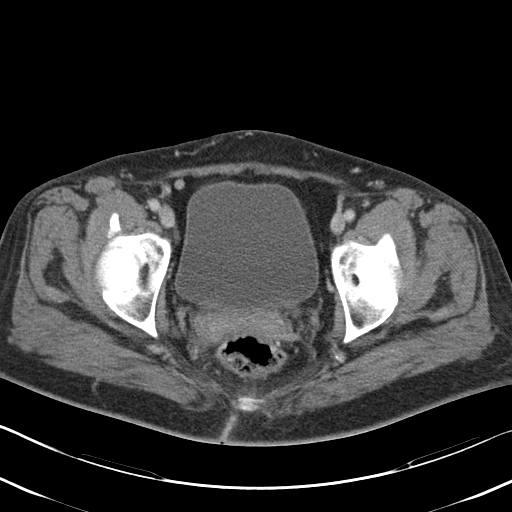
[im 27/95  soft-tissue]
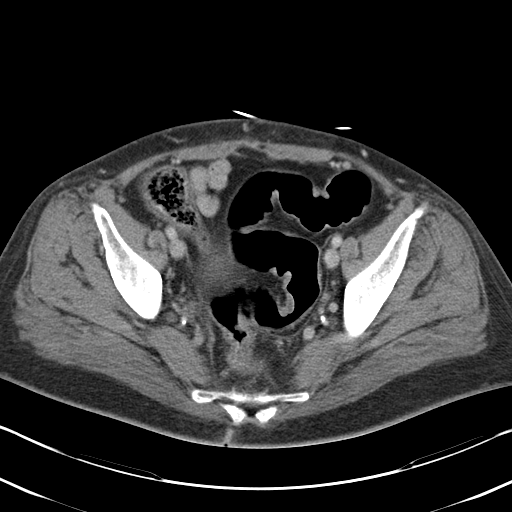
[im 37/95  soft-tissue]
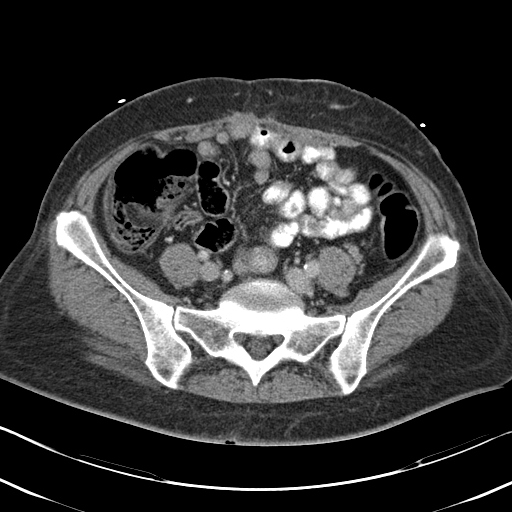
[im 42/95  soft-tissue]
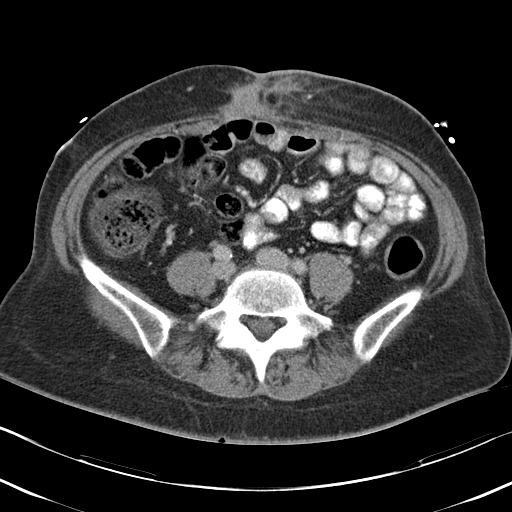
[im 53/95  soft-tissue]
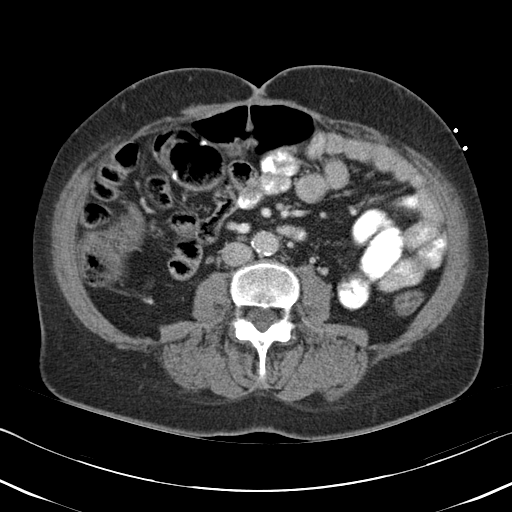
[im 58/95  soft-tissue]
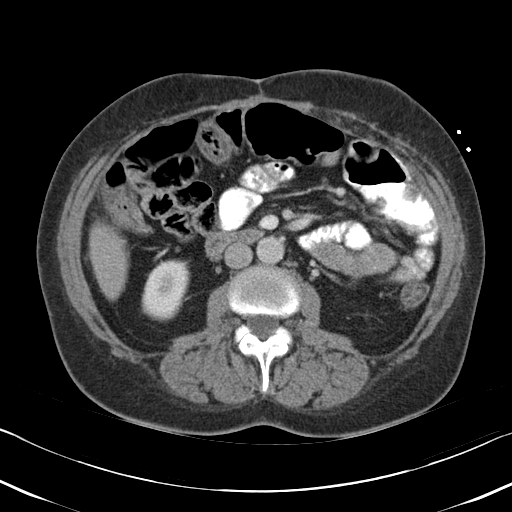
[im 68/95  soft-tissue]
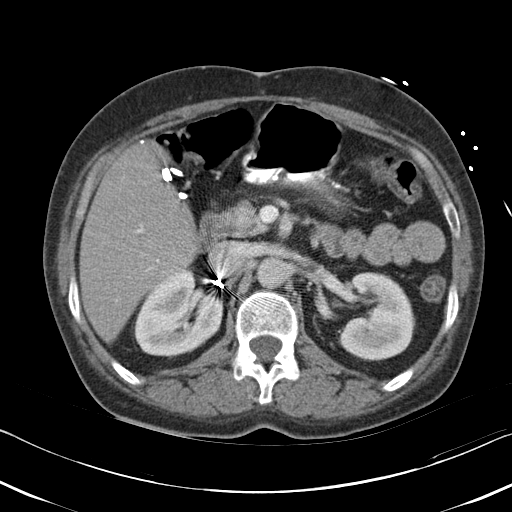
[im 68/95  bone]
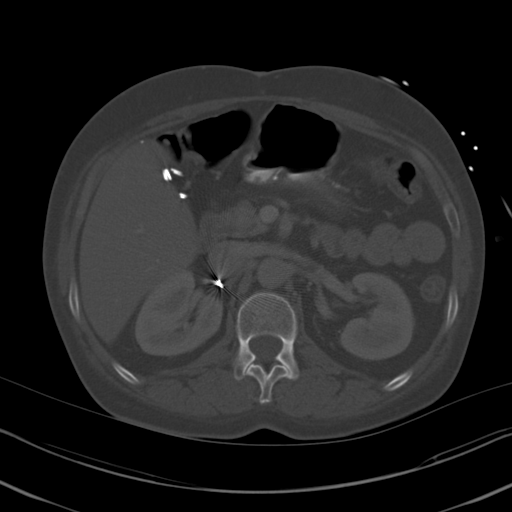
[im 74/95  soft-tissue]
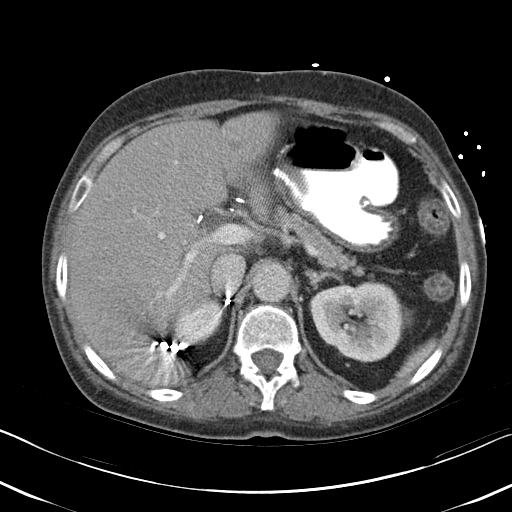
[im 79/95  soft-tissue]
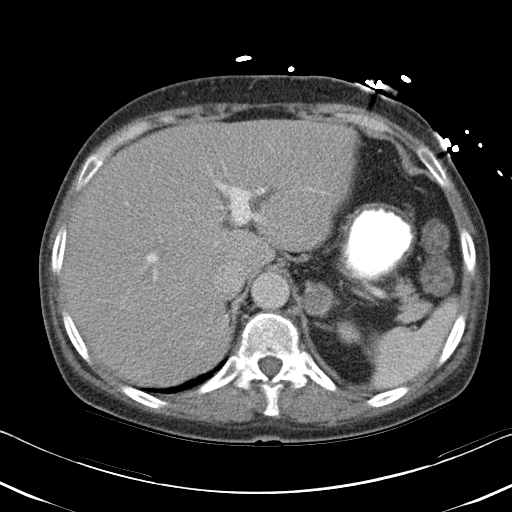
[im 89/95  soft-tissue]
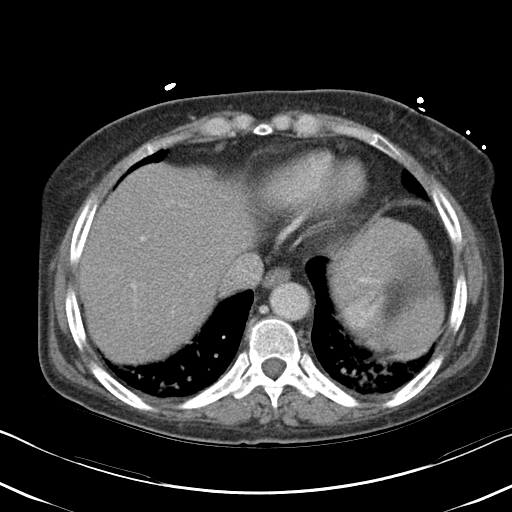

[Series 3: abd_pel_with 3.0 spo cor · coronal · 0.62mm/px · 3 of 83 slices shown]
[im 28/83  soft-tissue]
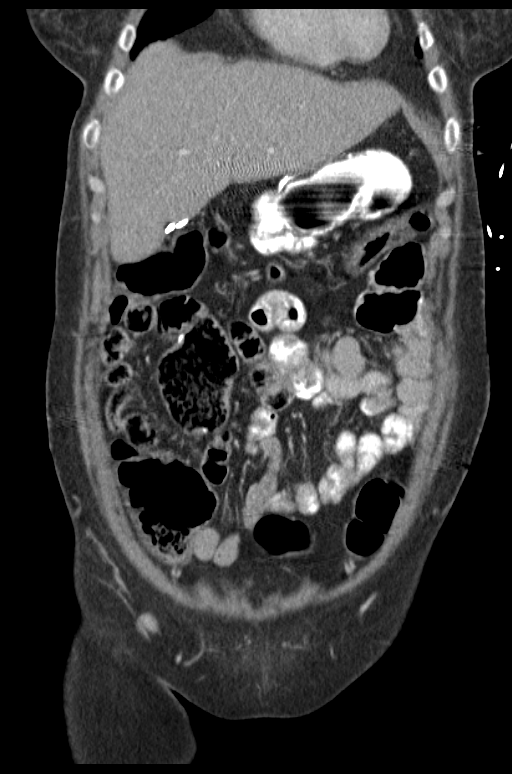
[im 37/83  soft-tissue]
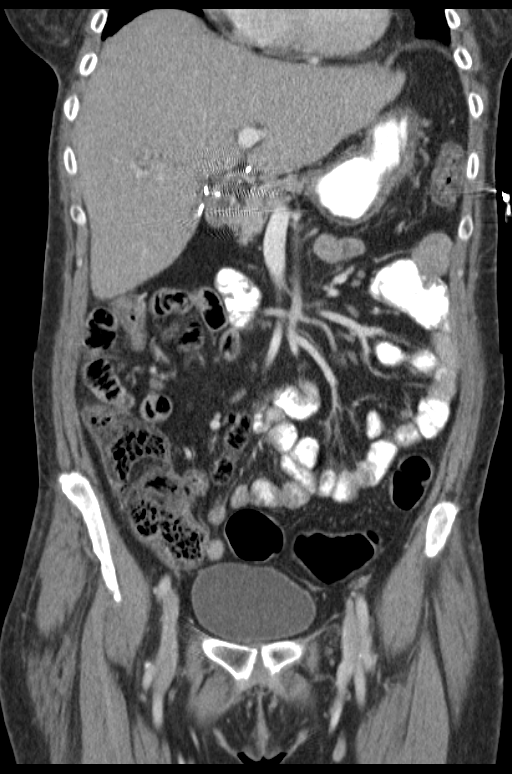
[im 46/83  soft-tissue]
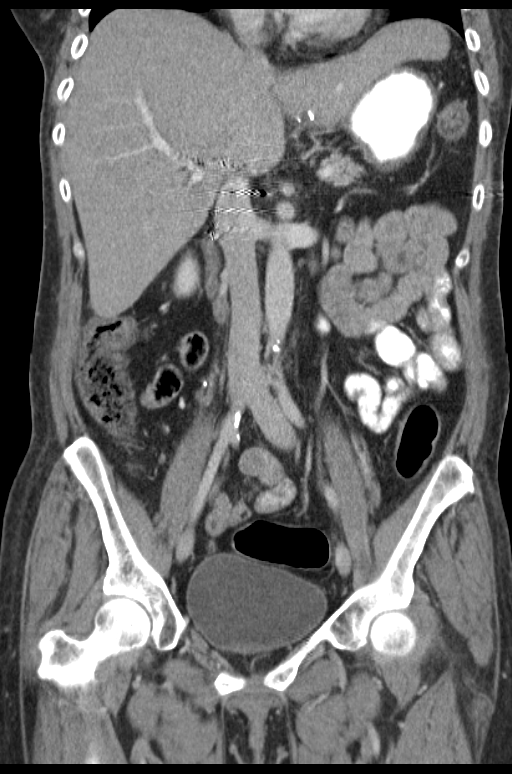

[15 of 46 positions shown; findings below may reference images not displayed]

FINDINGS: Lower Chest:  Unremarkable.

Hepatobiliary: Mild hepatic steatosis again noted. No liver masses
identified. Prior cholecystectomy again noted.

Pancreas: No mass, inflammatory changes, or other significant
abnormality identified.

Spleen:  Within normal limits in size and appearance.

Adrenals: 2 cm left adrenal mass remains stable compared to multiple
previous studies, consistent with benign adrenal adenoma.

Kidneys/Urinary Tract:  No evidence of masses or hydronephrosis.

Stomach/Bowel/Peritoneum: No evidence of wall thickening, mass, or
obstruction. Postop changes again seen from previous gastric bypass
surgery.

Vascular/Lymphatic: No pathologically enlarged lymph nodes
identified. No other significant abnormality visualized.

Reproductive: No mass or other significant abnormality identified.
Prior hysterectomy noted.

Other: Postop changes in the anterior abdominal wall subcutaneous
tissues remains stable, and there is no evidence of hernia.

Musculoskeletal:  No suspicious bone lesions identified.
IMPRESSION: No acute findings within the abdomen or pelvis.

Stable mild hepatic steatosis and benign left adrenal adenoma.

## 2015-09-30 DIAGNOSIS — R1013 Epigastric pain: Secondary | ICD-10-CM | POA: Diagnosis not present

## 2015-09-30 DIAGNOSIS — Z808 Family history of malignant neoplasm of other organs or systems: Secondary | ICD-10-CM

## 2015-09-30 DIAGNOSIS — Z90710 Acquired absence of both cervix and uterus: Secondary | ICD-10-CM

## 2015-09-30 DIAGNOSIS — I10 Essential (primary) hypertension: Secondary | ICD-10-CM | POA: Diagnosis present

## 2015-09-30 DIAGNOSIS — F1010 Alcohol abuse, uncomplicated: Secondary | ICD-10-CM | POA: Diagnosis present

## 2015-09-30 DIAGNOSIS — Z9884 Bariatric surgery status: Secondary | ICD-10-CM

## 2015-09-30 DIAGNOSIS — R51 Headache: Secondary | ICD-10-CM | POA: Diagnosis not present

## 2015-09-30 DIAGNOSIS — K852 Alcohol induced acute pancreatitis without necrosis or infection: Principal | ICD-10-CM | POA: Diagnosis present

## 2015-09-30 DIAGNOSIS — K219 Gastro-esophageal reflux disease without esophagitis: Secondary | ICD-10-CM | POA: Diagnosis present

## 2015-09-30 DIAGNOSIS — Z801 Family history of malignant neoplasm of trachea, bronchus and lung: Secondary | ICD-10-CM

## 2015-09-30 DIAGNOSIS — R112 Nausea with vomiting, unspecified: Secondary | ICD-10-CM | POA: Diagnosis not present

## 2015-09-30 DIAGNOSIS — D573 Sickle-cell trait: Secondary | ICD-10-CM | POA: Diagnosis present

## 2015-09-30 DIAGNOSIS — K5900 Constipation, unspecified: Secondary | ICD-10-CM | POA: Diagnosis not present

## 2015-09-30 DIAGNOSIS — K859 Acute pancreatitis without necrosis or infection, unspecified: Secondary | ICD-10-CM | POA: Diagnosis not present

## 2015-09-30 DIAGNOSIS — D638 Anemia in other chronic diseases classified elsewhere: Secondary | ICD-10-CM | POA: Diagnosis present

## 2015-09-30 DIAGNOSIS — Z818 Family history of other mental and behavioral disorders: Secondary | ICD-10-CM

## 2015-09-30 DIAGNOSIS — F259 Schizoaffective disorder, unspecified: Secondary | ICD-10-CM | POA: Diagnosis present

## 2015-09-30 DIAGNOSIS — E876 Hypokalemia: Secondary | ICD-10-CM | POA: Diagnosis not present

## 2015-09-30 DIAGNOSIS — Z800 Family history of malignant neoplasm of digestive organs: Secondary | ICD-10-CM

## 2015-09-30 DIAGNOSIS — M329 Systemic lupus erythematosus, unspecified: Secondary | ICD-10-CM | POA: Diagnosis present

## 2015-09-30 DIAGNOSIS — Z903 Acquired absence of stomach [part of]: Secondary | ICD-10-CM

## 2015-09-30 DIAGNOSIS — F609 Personality disorder, unspecified: Secondary | ICD-10-CM | POA: Diagnosis present

## 2015-09-30 DIAGNOSIS — J189 Pneumonia, unspecified organism: Secondary | ICD-10-CM | POA: Diagnosis present

## 2015-09-30 DIAGNOSIS — F319 Bipolar disorder, unspecified: Secondary | ICD-10-CM | POA: Diagnosis present

## 2015-09-30 DIAGNOSIS — Z803 Family history of malignant neoplasm of breast: Secondary | ICD-10-CM

## 2015-10-01 ENCOUNTER — Emergency Department (HOSPITAL_COMMUNITY): Payer: Medicare HMO

## 2015-10-01 ENCOUNTER — Encounter (HOSPITAL_COMMUNITY): Payer: Self-pay | Admitting: *Deleted

## 2015-10-01 ENCOUNTER — Inpatient Hospital Stay (HOSPITAL_COMMUNITY)
Admission: EM | Admit: 2015-10-01 | Discharge: 2015-10-08 | DRG: 438 | Disposition: A | Discharge status: Home / Self Care | Payer: Medicare HMO | Attending: Internal Medicine | Admitting: Internal Medicine

## 2015-10-01 DIAGNOSIS — D638 Anemia in other chronic diseases classified elsewhere: Secondary | ICD-10-CM | POA: Diagnosis present

## 2015-10-01 DIAGNOSIS — Z903 Acquired absence of stomach [part of]: Secondary | ICD-10-CM | POA: Diagnosis not present

## 2015-10-01 DIAGNOSIS — Z808 Family history of malignant neoplasm of other organs or systems: Secondary | ICD-10-CM | POA: Diagnosis not present

## 2015-10-01 DIAGNOSIS — M329 Systemic lupus erythematosus, unspecified: Secondary | ICD-10-CM | POA: Diagnosis present

## 2015-10-01 DIAGNOSIS — Z90710 Acquired absence of both cervix and uterus: Secondary | ICD-10-CM | POA: Diagnosis not present

## 2015-10-01 DIAGNOSIS — Z818 Family history of other mental and behavioral disorders: Secondary | ICD-10-CM | POA: Diagnosis not present

## 2015-10-01 DIAGNOSIS — Z9884 Bariatric surgery status: Secondary | ICD-10-CM | POA: Diagnosis not present

## 2015-10-01 DIAGNOSIS — F1410 Cocaine abuse, uncomplicated: Secondary | ICD-10-CM

## 2015-10-01 DIAGNOSIS — D573 Sickle-cell trait: Secondary | ICD-10-CM | POA: Diagnosis present

## 2015-10-01 DIAGNOSIS — I10 Essential (primary) hypertension: Secondary | ICD-10-CM | POA: Diagnosis present

## 2015-10-01 DIAGNOSIS — R69 Illness, unspecified: Secondary | ICD-10-CM | POA: Diagnosis not present

## 2015-10-01 DIAGNOSIS — K859 Acute pancreatitis without necrosis or infection, unspecified: Secondary | ICD-10-CM | POA: Diagnosis present

## 2015-10-01 DIAGNOSIS — K5900 Constipation, unspecified: Secondary | ICD-10-CM | POA: Diagnosis not present

## 2015-10-01 DIAGNOSIS — F319 Bipolar disorder, unspecified: Secondary | ICD-10-CM | POA: Diagnosis present

## 2015-10-01 DIAGNOSIS — K219 Gastro-esophageal reflux disease without esophagitis: Secondary | ICD-10-CM | POA: Diagnosis present

## 2015-10-01 DIAGNOSIS — K8500 Idiopathic acute pancreatitis without necrosis or infection: Secondary | ICD-10-CM | POA: Diagnosis not present

## 2015-10-01 DIAGNOSIS — D649 Anemia, unspecified: Secondary | ICD-10-CM | POA: Insufficient documentation

## 2015-10-01 DIAGNOSIS — K852 Alcohol induced acute pancreatitis without necrosis or infection: Secondary | ICD-10-CM | POA: Diagnosis not present

## 2015-10-01 DIAGNOSIS — F609 Personality disorder, unspecified: Secondary | ICD-10-CM | POA: Diagnosis present

## 2015-10-01 DIAGNOSIS — F1411 Cocaine abuse, in remission: Secondary | ICD-10-CM | POA: Diagnosis present

## 2015-10-01 DIAGNOSIS — J189 Pneumonia, unspecified organism: Secondary | ICD-10-CM | POA: Diagnosis present

## 2015-10-01 DIAGNOSIS — F259 Schizoaffective disorder, unspecified: Secondary | ICD-10-CM | POA: Diagnosis present

## 2015-10-01 DIAGNOSIS — R918 Other nonspecific abnormal finding of lung field: Secondary | ICD-10-CM | POA: Diagnosis not present

## 2015-10-01 DIAGNOSIS — F1010 Alcohol abuse, uncomplicated: Secondary | ICD-10-CM | POA: Diagnosis not present

## 2015-10-01 DIAGNOSIS — Z801 Family history of malignant neoplasm of trachea, bronchus and lung: Secondary | ICD-10-CM | POA: Diagnosis not present

## 2015-10-01 DIAGNOSIS — Z8659 Personal history of other mental and behavioral disorders: Secondary | ICD-10-CM

## 2015-10-01 DIAGNOSIS — Z803 Family history of malignant neoplasm of breast: Secondary | ICD-10-CM | POA: Diagnosis not present

## 2015-10-01 DIAGNOSIS — R112 Nausea with vomiting, unspecified: Secondary | ICD-10-CM | POA: Diagnosis not present

## 2015-10-01 DIAGNOSIS — R1013 Epigastric pain: Secondary | ICD-10-CM | POA: Diagnosis not present

## 2015-10-01 DIAGNOSIS — Z800 Family history of malignant neoplasm of digestive organs: Secondary | ICD-10-CM | POA: Diagnosis not present

## 2015-10-01 DIAGNOSIS — E876 Hypokalemia: Secondary | ICD-10-CM | POA: Diagnosis not present

## 2015-10-01 DIAGNOSIS — R51 Headache: Secondary | ICD-10-CM | POA: Diagnosis not present

## 2015-10-01 LAB — URINALYSIS, ROUTINE W REFLEX MICROSCOPIC
BILIRUBIN URINE: NEGATIVE
GLUCOSE, UA: NEGATIVE mg/dL
HGB URINE DIPSTICK: NEGATIVE
Ketones, ur: NEGATIVE mg/dL
Leukocytes, UA: NEGATIVE
Nitrite: NEGATIVE
PROTEIN: NEGATIVE mg/dL
Specific Gravity, Urine: >1.03 — ABNORMAL HIGH (ref 1.005–1.030)
pH: 5.5 (ref 5.0–8.0)

## 2015-10-01 LAB — COMPREHENSIVE METABOLIC PANEL
ALK PHOS: 123 U/L (ref 38–126)
ALK PHOS: 145 U/L — AB (ref 38–126)
ALT: 79 U/L — AB (ref 14–54)
ALT: 98 U/L — AB (ref 14–54)
ANION GAP: 10 (ref 5–15)
AST: 115 U/L — ABNORMAL HIGH (ref 15–41)
AST: 135 U/L — AB (ref 15–41)
Albumin: 3.5 g/dL (ref 3.5–5.0)
Albumin: 2.8 g/dL — ABNORMAL LOW (ref 3.5–5.0)
Anion gap: 11 (ref 5–15)
BILIRUBIN TOTAL: 0.8 mg/dL (ref 0.3–1.2)
BILIRUBIN TOTAL: 0.9 mg/dL (ref 0.3–1.2)
BUN: 7 mg/dL (ref 6–20)
BUN: 7 mg/dL (ref 6–20)
CALCIUM: 8.4 mg/dL — AB (ref 8.9–10.3)
CALCIUM: 8.9 mg/dL (ref 8.9–10.3)
CHLORIDE: 103 mmol/L (ref 101–111)
CO2: 22 mmol/L (ref 22–32)
CO2: 25 mmol/L (ref 22–32)
CREATININE: 0.42 mg/dL — AB (ref 0.44–1.00)
CREATININE: 0.51 mg/dL (ref 0.44–1.00)
Chloride: 103 mmol/L (ref 101–111)
Glucose, Bld: 99 mg/dL (ref 65–99)
Glucose, Bld: 69 mg/dL (ref 65–99)
Potassium: 3.8 mmol/L (ref 3.5–5.1)
Potassium: 3.4 mmol/L — ABNORMAL LOW (ref 3.5–5.1)
SODIUM: 138 mmol/L (ref 135–145)
Sodium: 136 mmol/L (ref 135–145)
TOTAL PROTEIN: 6.2 g/dL — AB (ref 6.5–8.1)
TOTAL PROTEIN: 7.3 g/dL (ref 6.5–8.1)

## 2015-10-01 LAB — CBC WITH DIFFERENTIAL/PLATELET
Basophils Absolute: 0 10*3/uL (ref 0.0–0.1)
Basophils Relative: 0 %
EOS ABS: 0 10*3/uL (ref 0.0–0.7)
EOS PCT: 0 %
HCT: 34 % — ABNORMAL LOW (ref 36.0–46.0)
Hemoglobin: 11.6 g/dL — ABNORMAL LOW (ref 12.0–15.0)
LYMPHS ABS: 1.1 10*3/uL (ref 0.7–4.0)
Lymphocytes Relative: 18 %
MCH: 30.2 pg (ref 26.0–34.0)
MCHC: 34.1 g/dL (ref 30.0–36.0)
MCV: 88.5 fL (ref 78.0–100.0)
MONO ABS: 1 10*3/uL (ref 0.1–1.0)
Monocytes Relative: 16 %
Neutro Abs: 4 10*3/uL (ref 1.7–7.7)
Neutrophils Relative %: 66 %
Platelets: 268 10*3/uL (ref 150–400)
RBC: 3.84 MIL/uL — AB (ref 3.87–5.11)
RDW: 17.5 % — AB (ref 11.5–15.5)
WBC: 6.1 10*3/uL (ref 4.0–10.5)

## 2015-10-01 LAB — RAPID URINE DRUG SCREEN, HOSP PERFORMED
Amphetamines: NOT DETECTED
Barbiturates: NOT DETECTED
Benzodiazepines: NOT DETECTED
Cocaine: NOT DETECTED
OPIATES: POSITIVE — AB
TETRAHYDROCANNABINOL: NOT DETECTED

## 2015-10-01 LAB — CBC
HEMATOCRIT: 30.9 % — AB (ref 36.0–46.0)
Hemoglobin: 10.2 g/dL — ABNORMAL LOW (ref 12.0–15.0)
MCH: 29.9 pg (ref 26.0–34.0)
MCHC: 33 g/dL (ref 30.0–36.0)
MCV: 90.6 fL (ref 78.0–100.0)
Platelets: 235 10*3/uL (ref 150–400)
RBC: 3.41 MIL/uL — AB (ref 3.87–5.11)
RDW: 17.6 % — AB (ref 11.5–15.5)
WBC: 7.6 10*3/uL (ref 4.0–10.5)

## 2015-10-01 LAB — LIPASE, BLOOD: LIPASE: 202 U/L — AB (ref 11–51)

## 2015-10-01 LAB — PROTIME-INR
INR: 1.22 (ref 0.00–1.49)
PROTHROMBIN TIME: 15.5 s — AB (ref 11.6–15.2)

## 2015-10-01 LAB — ETHANOL: Alcohol, Ethyl (B): 19 mg/dL — ABNORMAL HIGH (ref <5)

## 2015-10-01 LAB — MRSA PCR SCREENING: MRSA BY PCR: POSITIVE — AB

## 2015-10-01 LAB — TROPONIN I

## 2015-10-01 MED ORDER — IOPAMIDOL (ISOVUE-300) INJECTION 61%
100.0000 mL | Freq: Once | INTRAVENOUS | Status: AC | PRN
  Administered 2015-10-01: 100 mL via INTRAVENOUS

## 2015-10-01 MED ORDER — THIAMINE HCL 100 MG/ML IJ SOLN
Freq: Once | INTRAVENOUS | Status: DC
  Filled 2015-10-01: qty 1000

## 2015-10-01 MED ORDER — LORAZEPAM 2 MG/ML IJ SOLN
0.0000 mg | Freq: Four times a day (QID) | INTRAMUSCULAR | Status: AC
  Administered 2015-10-01: 2 mg via INTRAVENOUS
  Administered 2015-10-02: 1 mg via INTRAVENOUS
  Administered 2015-10-02: 2 mg via INTRAVENOUS
  Filled 2015-10-01 (×4): qty 1

## 2015-10-01 MED ORDER — MUPIROCIN 2 % EX OINT
1.0000 "application " | TOPICAL_OINTMENT | Freq: Two times a day (BID) | CUTANEOUS | Status: AC
  Administered 2015-10-01 – 2015-10-05 (×10): 1 via NASAL
  Filled 2015-10-01 (×3): qty 22

## 2015-10-01 MED ORDER — PANTOPRAZOLE SODIUM 40 MG PO TBEC
40.0000 mg | DELAYED_RELEASE_TABLET | Freq: Every day | ORAL | Status: DC
Start: 2015-10-01 — End: 2015-10-03
  Administered 2015-10-01 – 2015-10-03 (×3): 40 mg via ORAL
  Filled 2015-10-01 (×3): qty 1

## 2015-10-01 MED ORDER — HYDROMORPHONE HCL 1 MG/ML IJ SOLN
0.5000 mg | INTRAMUSCULAR | Status: DC | PRN
  Administered 2015-10-01 (×2): 0.5 mg via INTRAVENOUS
  Filled 2015-10-01 (×2): qty 1

## 2015-10-01 MED ORDER — SUCRALFATE 1 G PO TABS
1.0000 g | ORAL_TABLET | Freq: Four times a day (QID) | ORAL | Status: DC
  Administered 2015-10-01 – 2015-10-03 (×9): 1 g via ORAL
  Filled 2015-10-01 (×10): qty 1

## 2015-10-01 MED ORDER — LORAZEPAM 2 MG/ML IJ SOLN: 0.0000 mg | Freq: Four times a day (QID) | INTRAMUSCULAR | Status: DC

## 2015-10-01 MED ORDER — DEXTROSE-NACL 5-0.9 % IV SOLN
INTRAVENOUS | Status: DC
  Administered 2015-10-01 – 2015-10-06 (×7): via INTRAVENOUS

## 2015-10-01 MED ORDER — DIATRIZOATE MEGLUMINE & SODIUM 66-10 % PO SOLN
ORAL | Status: AC
  Filled 2015-10-01: qty 30

## 2015-10-01 MED ORDER — ENOXAPARIN SODIUM 40 MG/0.4ML ~~LOC~~ SOLN
40.0000 mg | SUBCUTANEOUS | Status: DC
  Administered 2015-10-01 – 2015-10-08 (×8): 40 mg via SUBCUTANEOUS
  Filled 2015-10-01 (×8): qty 0.4

## 2015-10-01 MED ORDER — VITAMIN B-1 100 MG PO TABS
100.0000 mg | ORAL_TABLET | Freq: Every day | ORAL | Status: DC
  Administered 2015-10-02 – 2015-10-07 (×6): 100 mg via ORAL
  Filled 2015-10-01 (×6): qty 1

## 2015-10-01 MED ORDER — DIPHENHYDRAMINE HCL 50 MG/ML IJ SOLN
25.0000 mg | Freq: Once | INTRAMUSCULAR | Status: AC
  Administered 2015-10-01: 25 mg via INTRAVENOUS
  Filled 2015-10-01: qty 1

## 2015-10-01 MED ORDER — FOLIC ACID 5 MG/ML IJ SOLN
INTRAMUSCULAR | Status: AC
  Filled 2015-10-01: qty 0.2

## 2015-10-01 MED ORDER — ADULT MULTIVITAMIN W/MINERALS CH
1.0000 | ORAL_TABLET | Freq: Every day | ORAL | Status: DC
  Administered 2015-10-01 – 2015-10-08 (×8): 1 via ORAL
  Filled 2015-10-01 (×8): qty 1

## 2015-10-01 MED ORDER — FENTANYL CITRATE (PF) 100 MCG/2ML IJ SOLN
25.0000 ug | Freq: Once | INTRAMUSCULAR | Status: AC
  Administered 2015-10-01: 25 ug via INTRAVENOUS
  Filled 2015-10-01: qty 2

## 2015-10-01 MED ORDER — LORAZEPAM 2 MG/ML IJ SOLN
0.0000 mg | Freq: Two times a day (BID) | INTRAMUSCULAR | Status: AC
  Administered 2015-10-03 – 2015-10-04 (×2): 1 mg via INTRAVENOUS
  Filled 2015-10-01 (×3): qty 1

## 2015-10-01 MED ORDER — SODIUM CHLORIDE 0.9 % IV BOLUS (SEPSIS)
500.0000 mL | Freq: Once | INTRAVENOUS | Status: AC
  Administered 2015-10-01: 500 mL via INTRAVENOUS

## 2015-10-01 MED ORDER — FOLIC ACID 1 MG PO TABS
1.0000 mg | ORAL_TABLET | Freq: Every day | ORAL | Status: DC
  Administered 2015-10-01 – 2015-10-08 (×8): 1 mg via ORAL
  Filled 2015-10-01 (×8): qty 1

## 2015-10-01 MED ORDER — ONDANSETRON HCL 4 MG/2ML IJ SOLN
4.0000 mg | Freq: Four times a day (QID) | INTRAMUSCULAR | Status: DC | PRN
  Administered 2015-10-02 (×2): 4 mg via INTRAVENOUS
  Filled 2015-10-01 (×2): qty 2

## 2015-10-01 MED ORDER — ONDANSETRON HCL 4 MG/2ML IJ SOLN
4.0000 mg | Freq: Once | INTRAMUSCULAR | Status: AC
  Administered 2015-10-01: 4 mg via INTRAVENOUS
  Filled 2015-10-01: qty 2

## 2015-10-01 MED ORDER — THIAMINE HCL 100 MG/ML IJ SOLN
INTRAMUSCULAR | Status: AC
Start: 2015-10-01 — End: 2015-10-01
  Filled 2015-10-01: qty 2

## 2015-10-01 MED ORDER — LORAZEPAM 2 MG/ML IJ SOLN: 0.0000 mg | Freq: Two times a day (BID) | INTRAMUSCULAR | Status: DC

## 2015-10-01 MED ORDER — HYDROMORPHONE HCL 1 MG/ML IJ SOLN
1.0000 mg | Freq: Once | INTRAMUSCULAR | Status: AC
  Administered 2015-10-01: 1 mg via INTRAVENOUS
  Filled 2015-10-01: qty 1

## 2015-10-01 MED ORDER — LORAZEPAM 2 MG/ML IJ SOLN
1.0000 mg | Freq: Four times a day (QID) | INTRAMUSCULAR | Status: AC | PRN
  Administered 2015-10-02 – 2015-10-04 (×4): 1 mg via INTRAVENOUS
  Filled 2015-10-01 (×2): qty 1

## 2015-10-01 MED ORDER — POTASSIUM CHLORIDE 10 MEQ/100ML IV SOLN
10.0000 meq | INTRAVENOUS | Status: AC
  Administered 2015-10-01: 10 meq via INTRAVENOUS
  Filled 2015-10-01: qty 100

## 2015-10-01 MED ORDER — GUAIFENESIN-DM 100-10 MG/5ML PO SYRP
10.0000 mL | ORAL_SOLUTION | Freq: Four times a day (QID) | ORAL | Status: DC | PRN
  Administered 2015-10-02 – 2015-10-06 (×5): 10 mL via ORAL
  Filled 2015-10-01 (×6): qty 10

## 2015-10-01 MED ORDER — METOCLOPRAMIDE HCL 5 MG/ML IJ SOLN
10.0000 mg | Freq: Once | INTRAMUSCULAR | Status: AC
  Administered 2015-10-01: 10 mg via INTRAVENOUS
  Filled 2015-10-01: qty 2

## 2015-10-01 MED ORDER — LORAZEPAM 1 MG PO TABS
1.0000 mg | ORAL_TABLET | Freq: Four times a day (QID) | ORAL | Status: AC | PRN
  Administered 2015-10-01 – 2015-10-03 (×2): 1 mg via ORAL
  Filled 2015-10-01 (×3): qty 1

## 2015-10-01 MED ORDER — DIPHENHYDRAMINE HCL 25 MG PO CAPS
25.0000 mg | ORAL_CAPSULE | Freq: Four times a day (QID) | ORAL | Status: DC | PRN
  Administered 2015-10-01 – 2015-10-07 (×4): 25 mg via ORAL
  Filled 2015-10-01 (×4): qty 1

## 2015-10-01 MED ORDER — M.V.I. ADULT IV INJ
INJECTION | INTRAVENOUS | Status: AC
  Filled 2015-10-01: qty 10

## 2015-10-01 MED ORDER — HYDROMORPHONE HCL 1 MG/ML IJ SOLN
1.0000 mg | INTRAMUSCULAR | Status: DC | PRN
  Administered 2015-10-01 – 2015-10-03 (×10): 1 mg via INTRAVENOUS
  Filled 2015-10-01 (×10): qty 1

## 2015-10-01 MED ORDER — THIAMINE HCL 100 MG/ML IJ SOLN
100.0000 mg | Freq: Every day | INTRAMUSCULAR | Status: DC
  Administered 2015-10-01: 100 mg via INTRAVENOUS
  Filled 2015-10-01: qty 2

## 2015-10-01 MED ORDER — VITAMIN B-1 100 MG PO TABS: 100.0000 mg | ORAL_TABLET | Freq: Every day | ORAL | Status: DC

## 2015-10-01 MED ORDER — CHLORHEXIDINE GLUCONATE CLOTH 2 % EX PADS
6.0000 | MEDICATED_PAD | Freq: Every day | CUTANEOUS | Status: AC
  Administered 2015-10-01 – 2015-10-05 (×4): 6 via TOPICAL

## 2015-10-01 MED ORDER — THIAMINE HCL 100 MG/ML IJ SOLN
100.0000 mg | Freq: Every day | INTRAMUSCULAR | Status: DC
  Administered 2015-10-08: 100 mg via INTRAVENOUS
  Filled 2015-10-01 (×3): qty 2

## 2015-10-01 MED ORDER — LEVOFLOXACIN IN D5W 750 MG/150ML IV SOLN
750.0000 mg | INTRAVENOUS | Status: DC
  Administered 2015-10-01 – 2015-10-08 (×8): 750 mg via INTRAVENOUS
  Filled 2015-10-01 (×8): qty 150

## 2015-10-01 MED ORDER — ONDANSETRON HCL 4 MG PO TABS
4.0000 mg | ORAL_TABLET | Freq: Four times a day (QID) | ORAL | Status: DC | PRN
  Administered 2015-10-01 – 2015-10-08 (×4): 4 mg via ORAL
  Filled 2015-10-01 (×5): qty 1

## 2015-10-01 NOTE — ED Provider Notes (Addendum)
CSN: [id number]     Arrival date & time 09/30/15  2357 History   By signing my name below, I, Maud Deed. Royston Sinner, attest that this documentation has been prepared under the direction and in the presence of Orpah Greek, MD.  Electronically Signed: Maud Deed. Royston Sinner, ED Scribe. 10/01/2015. 12:24 AM.   Chief Complaint  Patient presents with  . Abdominal Pain   The history is provided by the patient. No language interpreter was used.    HPI Comments: Angel Price is a 50 y.o. female with a PMHx of lupus, GERD and gastritis who presents to the Emergency Department complaining of constant, unchanged abdominal pain that radiates to the L chest onset yesterday. No aggravating or alleviating factors reported. She also reports ongoing intermittent nausea and vomiting. No OTC medications or home remedies attempted prior to arrival. No recent fever or chills.  PCP: Rosita Fire, MD    Past Medical History  Diagnosis Date  . Lupus ([place])   . 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 Hypertension   . Abscess     soft tissue  . Suicide attempt ([place])   . Blood transfusion without reported diagnosis   . Chronic abdominal pain   . Chronic wound infection of abdomen ([place])   . Adrenal mass ([place])   . Anxiety   . Depression   . GERD (gastroesophageal reflux disease)   . Sickle cell trait ([place])   . Gastroparesis Nov 2015  . Nausea and vomiting     chronic, recurrent  . Alcohol abuse   . History of Billroth II operation   . Lung nodule < 6cm on CT 04/25/2014  . Hemorrhoid     internal large  . Diverticulosis     colonoscopy 04/2014 moderat pan colonic  . Colon polyp     colonoscopy 04/2014  . Gastritis     EGD 05/2014  . Hiatal hernia   . Schatzki's ring     patent per EGD 04/2014  . Lung nodule     CT 02/2014 needs repeat 1 month   Past Surgical History  Procedure Laterality Date  . Abdominal surgery    . Abdominal hysterectomy  2013    [place]  . Debridement of  abdominal wall abscess N/A 02/08/2013    Procedure: DEBRIDEMENT OF ABDOMINAL WALL ABSCESS;  Surgeon: Jamesetta So, MD;  Location: [facility];  Service: General;  Laterality: N/A;  . Billroth ii procedure       [place], first 2000, 2005/2006.  . Colonoscopy      in [place]  . Cholecystectomy    . Colonoscopy with propofol N/A 05/20/2013    Dr.Rourk- inadequate prep, normal appearing rectum, grossly normal colon aside from pancolonic diverticula, normal terminal ileum bx= unremarkable colonic mucosa. Due for early interval 2016.   Marland Kitchen Esophagogastroduodenoscopy (egd) with propofol N/A 05/20/2013    Dr.Rourk- s/p prior gastric surgery with normal esophagus, residual gastric mucosa and patent efferent limb  . Biopsy  05/20/2013    Procedure: BIOPSIES OF ASCENDING AND SIGMOID COLON;  Surgeon: Daneil Dolin, MD;  Location: [facility];  Service: Endoscopy;;  . Wound exploration Right 06/24/2013    Procedure: exploration of traumatic wound right wrist;  Surgeon: Tennis Must, MD;  Location: [place];  Service: Orthopedics;  Laterality: Right;  . Tendon repar Right     wrist  . Adrenal turmor removal    . Esophagogastroduodenoscopy (egd) with propofol N/A 02/03/2014    Dr. Gala Romney:  s/p hemigastrectomy with retained gastric contents. Residual gastric mucosa and efferent limb appeared normal otherwise. Query gastroparesis.   . Colonoscopy with propofol N/A 04/26/2014    [id number] ileum/one colon polyp removed/moderate pan-colonic diverticulosis/large internal hemorrhoids  . Esophagogastroduodenoscopy (egd) with propofol N/A 04/26/2014    [id number] ring/small HH/mild non-erosive gasrtitis/normal anastomosis  . Biopsy  04/26/2014    Procedure: BIOPSIES;  Surgeon: Danie Binder, MD;  Location: [facility];  Service: Endoscopy;;  . Colonoscopy with propofol N/A 12/23/2014    Dr.Rourk- minimal internal hemorrhoids, pancolonic diverticulosis  . Esophagogastroduodenoscopy (egd) with propofol N/A 12/23/2014    Dr.Rourk- s/p  prior hemigastrctomy, active oozing from anastomotic suture site, hemostasis achieved  . Agile capsule N/A 01/05/2015    Procedure: AGILE CAPSULE;  Surgeon: Daneil Dolin, MD;  Location: [facility];  Service: Endoscopy;  Laterality: N/A;  0700   Family History  Problem Relation Age of Onset  . Brain cancer Son   . Schizophrenia Son   . Cancer Son     brain  . Breast cancer Maternal Aunt   . Bipolar disorder Maternal Aunt   . Drug abuse Maternal Aunt   . Colon cancer Maternal Grandmother     late 49s, early 93s  . Lung cancer Father   . Cancer Father     mets  . Liver disease Neg Hx   . Drug abuse Mother   . Drug abuse Sister   . Drug abuse Brother   . Bipolar disorder Paternal Grandfather   . Bipolar disorder Cousin    Social History  Substance Use Topics  . Smoking status: Current Every Day Smoker -- 0.50 packs/day for 35 years    Types: Cigarettes  . Smokeless tobacco: Never Used  . Alcohol Use: 7.2 oz/week    12 Cans of beer, 0 Standard drinks or equivalent per week     Comment: 5 12 oz beers a day   OB History    No data available     Review of Systems  Constitutional: Negative for fever and chills.  Cardiovascular: Positive for chest pain.  Gastrointestinal: Positive for nausea, vomiting and abdominal pain.  All other systems reviewed and are negative.     Allergies  Codeine and Morphine and related  Home Medications   Prior to Admission medications   Medication Sig Start Date End Date Taking? Authorizing Provider  fluticasone (FLONASE) 50 MCG/ACT nasal spray Place 1 spray into both nostrils daily.   Yes Historical Provider, MD  lisinopril (PRINIVIL,ZESTRIL) 10 MG tablet Take 10 mg by mouth daily.   Yes Historical Provider, MD  metoCLOPramide (REGLAN) 10 MG tablet Take 1 tablet (10 mg total) by mouth 4 (four) times daily -  before meals and at bedtime. 04/06/14  Yes Lezlie Octave Black, NP  ondansetron (ZOFRAN ODT) 8 MG disintegrating tablet Take 1 tablet (8  mg total) by mouth every 8 (eight) hours as needed for nausea or vomiting. 02/10/15  Yes Almyra Free Idol, PA-C  ondansetron (ZOFRAN) 4 MG tablet Take 1 tablet (4 mg total) by mouth every 6 (six) hours. 06/23/15  Yes Carlis Stable, NP  pantoprazole (PROTONIX) 40 MG tablet Take 1 tablet (40 mg total) by mouth 2 (two) times daily before a meal. Patient taking differently: Take 40 mg by mouth daily.  09/04/14  Yes Daleen Bo, MD  sucralfate (CARAFATE) 1 G tablet Take 1 g by mouth 4 (four) times daily.   Yes Historical Provider, MD  traMADol (ULTRAM) 50 MG tablet  Take 1 tablet (50 mg total) by mouth 3 (three) times daily as needed. 03/13/15  Yes Virgel Manifold, MD  chlordiazePOXIDE (LIBRIUM) 25 MG capsule 50mg  PO TID x 1D, then 25-50mg  PO BID X 1D, then 25-50mg  PO QD X 1D,   May continue the librium one pill daily if needed Patient not taking: Reported on 06/12/2015 03/29/15   Milton Ferguson, MD  dexamethasone (DECADRON) 4 MG tablet Take 1 tablet (4 mg total) by mouth 2 (two) times daily with a meal. 09/25/15   Lily Kocher, PA-C  HYDROcodone-homatropine (HYCODAN) 5-1.5 MG/5ML syrup Take 5 mLs by mouth every 6 (six) hours as needed. 09/25/15   Lily Kocher, PA-C  hydrocortisone (ANUSOL-HC) 25 MG suppository Place 1 suppository (25 mg total) rectally 2 (two) times daily. Patient not taking: Reported on 03/13/2015 12/25/14   Rosita Fire, MD  promethazine (PHENERGAN) 25 MG tablet Take 1 tablet (25 mg total) by mouth every 6 (six) hours as needed for nausea or vomiting. 01/07/15   Tammy Triplett, PA-C   Triage Vitals: BP 117/86 mmHg  Pulse 98  Temp(Src) 98 F (36.7 C) (Oral)  Resp 28  Ht 5\' 3"  (1.6 m)  Wt 132 lb (59.875 kg)  BMI 23.39 kg/m2  SpO2 94%   Physical Exam  Constitutional: She is oriented to person, place, and time. She appears well-developed and well-nourished. No distress.  HENT:  Head: Normocephalic and atraumatic.  Right Ear: Hearing normal.  Left Ear: Hearing normal.  Nose: Nose normal.   Mouth/Throat: Oropharynx is clear and moist and mucous membranes are normal.  Eyes: Conjunctivae and EOM are normal. Pupils are equal, round, and reactive to light.  Neck: Normal range of motion. Neck supple.  Cardiovascular: Regular rhythm, S1 normal and S2 normal.  Tachycardia present.  Exam reveals no gallop and no friction rub.   No murmur heard. Pulmonary/Chest: Effort normal and breath sounds normal. No respiratory distress. She exhibits tenderness.  L anterior chest tenderness   Abdominal: Soft. Normal appearance and bowel sounds are normal. There is no hepatosplenomegaly. There is tenderness. There is no rebound, no guarding, no tenderness at McBurney's point and negative Murphy's sign. No hernia.  Epigastric tenderness   Musculoskeletal: Normal range of motion.  Neurological: She is alert and oriented to person, place, and time. She has normal strength. No cranial nerve deficit or sensory deficit. Coordination normal. GCS eye subscore is 4. GCS verbal subscore is 5. GCS motor subscore is 6.  Skin: Skin is warm, dry and intact. No rash noted. No cyanosis.  Psychiatric: She has a normal mood and affect. Her speech is normal and behavior is normal. Thought content normal.  Nursing note and vitals reviewed.   ED Course  Procedures (including critical care time)  DIAGNOSTIC STUDIES: Oxygen Saturation is 95% on RA, adequate by my interpretation.    COORDINATION OF CARE: 12:20 AM-Will order EKG, blood work, urinalysis, and imaging. Will give Dilaudid, fluids, Zofran, Reglan, and Benadryl. Discussed treatment plan with pt at bedside and pt agreed to plan.     Labs Review Labs Reviewed  CBC WITH DIFFERENTIAL/PLATELET - Abnormal; Notable for the following:    RBC 3.84 (*)    Hemoglobin 11.6 (*)    HCT 34.0 (*)    RDW 17.5 (*)    All other components within normal limits  COMPREHENSIVE METABOLIC PANEL - Abnormal; Notable for the following:    AST 135 (*)    ALT 98 (*)    Alkaline  Phosphatase 145 (*)  All other components within normal limits  LIPASE, BLOOD - Abnormal; Notable for the following:    Lipase 202 (*)    All other components within normal limits  URINALYSIS, ROUTINE W REFLEX MICROSCOPIC (NOT AT [facility]) - Abnormal; Notable for the following:    Specific Gravity, Urine >1.030 (*)    All other components within normal limits  TROPONIN I  ETHANOL    Imaging Review Ct Abdomen Pelvis W Contrast  10/01/2015  CLINICAL DATA:  Epigastric pain radiating to the chest. Started yesterday. EXAM: CT ABDOMEN AND PELVIS WITH CONTRAST TECHNIQUE: Multidetector CT imaging of the abdomen and pelvis was performed using the standard protocol following bolus administration of intravenous contrast. CONTRAST:  116mL ISOVUE-300 IOPAMIDOL (ISOVUE-300) INJECTION 61% COMPARISON:  03/16/2015 FINDINGS: Patchy airspace disease in both lower lungs and in the perihilar regions. This could represent multifocal pneumonia or pulmonary edema. Residual contrast material in the lower esophagus could indicate reflux or dysmotility. Surgical clips at the EG junction. Diffuse fatty infiltration of the liver. Surgical absence of the gallbladder. No bile duct dilatation. Postoperative changes in the right upper quadrant consistent with prior right adrenalectomy and partial gastrectomy with small bowel anastomosis. There is infiltration and edema in the fat around the head and body of the pancreas as well as around the C-loop of the duodenum with extension along the anterior retroperitoneum and up around the celiac axis. This is consistent with inflammatory process, likely pancreatitis or duodenitis. Follow-up after resolution of acute process is recommended to exclude underlying pancreatic lesion. 2 cm left adrenal gland nodule is unchanged since previous study. The spleen, kidneys, inferior vena cava, and retroperitoneal lymph nodes are unremarkable. Scattered calcification in the abdominal aorta without  aneurysm. Stomach, small bowel, and colon are not abnormally distended. No free air or free fluid in the abdomen. There is scarring in the anterior abdominal wall without change since prior study. Pelvis: Appendix is not identified. Uterus is surgically absent. No pelvic mass or lymphadenopathy. No free or loculated pelvic fluid collection. Bladder wall is not thickened. No destructive bone lesions. IMPRESSION: Infiltration edema around the head of the pancreas, duodenum, and celiac axis. This is most likely inflammatory and suggest pancreatitis and/or duodenitis. Followup after resolution of acute process is recommended to exclude underlying pancreatic lesion. Unchanged left adrenal gland nodule. Diffuse fatty infiltration of the liver. Postoperative changes in the abdomen as described. Electronically Signed   By: Lucienne Capers M.D.   On: 10/01/2015 03:07   Dg Abd Acute W/chest  10/01/2015  CLINICAL DATA:  50 year old female with abdominal pain radiating to the left chest. EXAM: DG ABDOMEN ACUTE W/ 1V CHEST COMPARISON:  Chest radiograph dated 09/25/2015 FINDINGS: Bibasilar interstitial prominence and nodularity may represent atelectatic changes. Developing pneumonia is not excluded. No focal consolidation, pleural effusion, or pneumothorax. The cardiac silhouette is within normal limits. There are multiple surgical clips in the upper abdomen and epigastric area. Bowel suture noted in the upper abdomen. There is moderate stool throughout the colon. No bowel dilatation or evidence of obstruction. No free air. A 12 mm nodular density in the right upper abdomen likely corresponds the nipple shadow. A Foley catheter extends to the bladder area. No acute osseous pathology. IMPRESSION: Constipation.  No evidence of bowel obstruction or free air. Bibasilar atelectatic changes. Developing pneumonia is not excluded. Clinical correlation is recommended. Electronically Signed   By: Anner Crete M.D.   On: 10/01/2015  01:32   I have personally reviewed and evaluated these images and  lab results as part of my medical decision-making.   EKG Interpretation   Date/Time:  Sunday October 01 2015 [id number] EDT Ventricular Rate:  106 PR Interval:    QRS Duration: 84 QT Interval:  355 QTC Calculation: 472 R Axis:   57 Text Interpretation:  Sinus tachycardia Abnormal R-wave progression, early  transition Baseline wander in lead(s) V6 Confirmed by Tinzlee Craker  MD,  [place] [id number]) on 10/01/2015 1:02:29 AM      MDM   Final diagnoses:  Acute pancreatitis, unspecified pancreatitis type    Patient presents to the emergency department for evaluation of abdominal pain, chest pain, nausea and vomiting. Symptoms began yesterday. Patient reports constant, sharp and stabbing pain in the center of her upper abdomen that radiates up into the chest. She does have a history of gastroparesis. She does report that the symptoms today are different than what she has had in the past.  Patient does have elevated LFTs. This is in a pattern that is consistent with alcohol abuse. She does have a history of elevated LFTs in similar manner. She did, however, have an elevated lipase of 202. CT scan was performed. This does show inflammatory changes around the pancreas consistent with acute pancreatitis. There are also opacities in the lung bases that could be pneumonia. Patient has not been experiencing pulmonary symptoms, but has vomited multiple times. Cannot rule out pneumonia as well as aspiration. Patient initiated empirically on Levaquin.  I personally performed the services described in this documentation, which was scribed in my presence. The recorded information has been reviewed and is accurate.   Orpah Greek, MD 10/01/15 [id number]  Orpah Greek, MD 10/01/15 [id number]

## 2015-10-01 NOTE — Progress Notes (Signed)
CRITICAL VALUE ALERT  Critical value received:  MRSA positive  Date of notification:  10/01/2015  Time of notification:  0955  Critical value read back:Yes.    Nurse who received alert:  Gershon Cull, RN  MD notified (1st page):  Dr. Anastasio Champion  Time of first page:  1002  MD notified (2nd page):  Time of second page:  Responding MD:  Dr. Anastasio Champion  Time MD responded:  75  Dr. Anastasio Champion called and notified that patient is MRSA positive.

## 2015-10-01 NOTE — Progress Notes (Signed)
Patient requesting something for itching.  Dr. Anastasio Champion notified and gave order for patient to receive Benadryl po every 6 hours as needed for itching.

## 2015-10-01 NOTE — Progress Notes (Signed)
Late entry:  Dr. Legrand Rams on unit and notified of patient's VS.

## 2015-10-01 NOTE — H&P (Signed)
History and Physical    MATTILYN HUENINK [id number] DOB: March 10, 1966 DOA: 10/01/2015  PCP: Rosita Fire, MD  Patient coming from: home  Chief Complaint:  Abdominal pain  HPI: Angel Price is a 50 y.o. female with medical history significant of polysubstance abuse, bipolar disorder comes in with one day of worsening epigastric pain with associated nausea and vomiting.  Denies fevers.  No cough or sob.  Last drink was last night.  Longest period of sober in last 2 years was a week.  No diarrhea.  Pt found to have pancreatitis and admitted for same.  Review of Systems: As per HPI otherwise 10 point review of systems negative.   Past Medical History  Diagnosis Date  . Lupus ([place])   . 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 Hypertension   . Abscess     soft tissue  . Suicide attempt ([place])   . Blood transfusion without reported diagnosis   . Chronic abdominal pain   . Chronic wound infection of abdomen ([place])   . Adrenal mass ([place])   . Anxiety   . Depression   . GERD (gastroesophageal reflux disease)   . Sickle cell trait ([place])   . Gastroparesis Nov 2015  . Nausea and vomiting     chronic, recurrent  . Alcohol abuse   . History of Billroth II operation   . Lung nodule < 6cm on CT 04/25/2014  . Hemorrhoid     internal large  . Diverticulosis     colonoscopy 04/2014 moderat pan colonic  . Colon polyp     colonoscopy 04/2014  . Gastritis     EGD 05/2014  . Hiatal hernia   . Schatzki's ring     patent per EGD 04/2014  . Lung nodule     CT 02/2014 needs repeat 1 month    Past Surgical History  Procedure Laterality Date  . Abdominal surgery    . Abdominal hysterectomy  2013    [place]  . Debridement of abdominal wall abscess N/A 02/08/2013    Procedure: DEBRIDEMENT OF ABDOMINAL WALL ABSCESS;  Surgeon: Jamesetta So, MD;  Location: [facility];  Service: General;  Laterality: N/A;  . Billroth ii procedure       [place], first 2000, 2005/2006.  . Colonoscopy        in [place]  . Cholecystectomy    . Colonoscopy with propofol N/A 05/20/2013    Dr.Rourk- inadequate prep, normal appearing rectum, grossly normal colon aside from pancolonic diverticula, normal terminal ileum bx= unremarkable colonic mucosa. Due for early interval 2016.   Marland Kitchen Esophagogastroduodenoscopy (egd) with propofol N/A 05/20/2013    Dr.Rourk- s/p prior gastric surgery with normal esophagus, residual gastric mucosa and patent efferent limb  . Biopsy  05/20/2013    Procedure: BIOPSIES OF ASCENDING AND SIGMOID COLON;  Surgeon: Daneil Dolin, MD;  Location: [facility];  Service: Endoscopy;;  . Wound exploration Right 06/24/2013    Procedure: exploration of traumatic wound right wrist;  Surgeon: Tennis Must, MD;  Location: [place];  Service: Orthopedics;  Laterality: Right;  . Tendon repar Right     wrist  . Adrenal turmor removal    . Esophagogastroduodenoscopy (egd) with propofol N/A 02/03/2014    Dr. Gala Romney:  s/p hemigastrectomy with retained gastric contents. Residual gastric mucosa and efferent limb appeared normal otherwise. Query gastroparesis.   . Colonoscopy with propofol N/A 04/26/2014    [id number] ileum/one colon polyp removed/moderate pan-colonic diverticulosis/large internal hemorrhoids  .  Esophagogastroduodenoscopy (egd) with propofol N/A 04/26/2014    [id number] ring/small HH/mild non-erosive gasrtitis/normal anastomosis  . Biopsy  04/26/2014    Procedure: BIOPSIES;  Surgeon: Danie Binder, MD;  Location: [facility];  Service: Endoscopy;;  . Colonoscopy with propofol N/A 12/23/2014    Dr.Rourk- minimal internal hemorrhoids, pancolonic diverticulosis  . Esophagogastroduodenoscopy (egd) with propofol N/A 12/23/2014    Dr.Rourk- s/p prior hemigastrctomy, active oozing from anastomotic suture site, hemostasis achieved  . Agile capsule N/A 01/05/2015    Procedure: AGILE CAPSULE;  Surgeon: Daneil Dolin, MD;  Location: [facility];  Service: Endoscopy;  Laterality: N/A;  0700     reports  that she has been smoking Cigarettes.  She has a 17.5 pack-year smoking history. She has never used smokeless tobacco. She reports that she drinks about 7.2 oz of alcohol per week. She reports that she does not use illicit drugs.  Allergies  Allergen Reactions  . Codeine Hives  . Morphine And Related Hives    gastritis    Family History  Problem Relation Age of Onset  . Brain cancer Son   . Schizophrenia Son   . Cancer Son     brain  . Breast cancer Maternal Aunt   . Bipolar disorder Maternal Aunt   . Drug abuse Maternal Aunt   . Colon cancer Maternal Grandmother     late 37s, early 65s  . Lung cancer Father   . Cancer Father     mets  . Liver disease Neg Hx   . Drug abuse Mother   . Drug abuse Sister   . Drug abuse Brother   . Bipolar disorder Paternal Grandfather   . Bipolar disorder Cousin     Prior to Admission medications   Medication Sig Start Date End Date Taking? Authorizing Provider  fluticasone (FLONASE) 50 MCG/ACT nasal spray Place 1 spray into both nostrils daily.   Yes Historical Provider, MD  lisinopril (PRINIVIL,ZESTRIL) 10 MG tablet Take 10 mg by mouth daily.   Yes Historical Provider, MD  metoCLOPramide (REGLAN) 10 MG tablet Take 1 tablet (10 mg total) by mouth 4 (four) times daily -  before meals and at bedtime. 04/06/14  Yes Lezlie Octave Black, NP  ondansetron (ZOFRAN ODT) 8 MG disintegrating tablet Take 1 tablet (8 mg total) by mouth every 8 (eight) hours as needed for nausea or vomiting. 02/10/15  Yes Almyra Free Idol, PA-C  ondansetron (ZOFRAN) 4 MG tablet Take 1 tablet (4 mg total) by mouth every 6 (six) hours. 06/23/15  Yes Carlis Stable, NP  pantoprazole (PROTONIX) 40 MG tablet Take 1 tablet (40 mg total) by mouth 2 (two) times daily before a meal. Patient taking differently: Take 40 mg by mouth daily.  09/04/14  Yes Daleen Bo, MD  sucralfate (CARAFATE) 1 G tablet Take 1 g by mouth 4 (four) times daily.   Yes Historical Provider, MD  traMADol (ULTRAM) 50 MG tablet  Take 1 tablet (50 mg total) by mouth 3 (three) times daily as needed. 03/13/15  Yes Virgel Manifold, MD  chlordiazePOXIDE (LIBRIUM) 25 MG capsule 50mg  PO TID x 1D, then 25-50mg  PO BID X 1D, then 25-50mg  PO QD X 1D,   May continue the librium one pill daily if needed Patient not taking: Reported on 06/12/2015 03/29/15   Milton Ferguson, MD  dexamethasone (DECADRON) 4 MG tablet Take 1 tablet (4 mg total) by mouth 2 (two) times daily with a meal. 09/25/15   Lily Kocher, PA-C  HYDROcodone-homatropine [facility]) 5-1.5  MG/5ML syrup Take 5 mLs by mouth every 6 (six) hours as needed. 09/25/15   Lily Kocher, PA-C  hydrocortisone (ANUSOL-HC) 25 MG suppository Place 1 suppository (25 mg total) rectally 2 (two) times daily. Patient not taking: Reported on 03/13/2015 12/25/14   Rosita Fire, MD  promethazine (PHENERGAN) 25 MG tablet Take 1 tablet (25 mg total) by mouth every 6 (six) hours as needed for nausea or vomiting. 01/07/15   Kem Parkinson, PA-C    Physical Exam: Filed Vitals:   10/01/15 0230 10/01/15 0331 10/01/15 0400 10/01/15 0430  BP: 142/102 131/97 141/101 140/107  Pulse: 95 83 89 95  Temp:      TempSrc:      Resp: 24  23 19   Height:      Weight:      SpO2: 94%  94% 96%      Constitutional: NAD, calm, comfortable Filed Vitals:   10/01/15 0230 10/01/15 0331 10/01/15 0400 10/01/15 0430  BP: 142/102 131/97 141/101 140/107  Pulse: 95 83 89 95  Temp:      TempSrc:      Resp: 24  23 19   Height:      Weight:      SpO2: 94%  94% 96%   Eyes: PERRL, lids and conjunctivae normal ENMT: Mucous membranes are moist. Posterior pharynx clear of any exudate or lesions.Normal dentition.  Neck: normal, supple, no masses, no thyromegaly Respiratory: clear to auscultation bilaterally, no wheezing, no crackles. Normal respiratory effort. No accessory muscle use.  Cardiovascular: Regular rate and rhythm, no murmurs / rubs / gallops. No extremity edema. 2+ pedal pulses. No carotid bruits.  Abdomen: no  tenderness, no masses palpated. No hepatosplenomegaly. Bowel sounds positive.  Musculoskeletal: no clubbing / cyanosis. No joint deformity upper and lower extremities. Good ROM, no contractures. Normal muscle tone.  Skin: no rashes, lesions, ulcers. No induration Neurologic: CN 2-12 grossly intact. Sensation intact, DTR normal. Strength 5/5 in all 4.  Psychiatric: Normal judgment and insight. Alert and oriented x 3. Normal mood.    Labs on Admission: I have personally reviewed following labs and imaging studies  CBC:  Recent Labs Lab 10/01/15 0030  WBC 6.1  NEUTROABS 4.0  HGB 11.6*  HCT 34.0*  MCV 88.5  PLT [id number]   Basic Metabolic Panel:  Recent Labs Lab 10/01/15 0030  NA 136  K 3.8  CL 103  CO2 22  GLUCOSE 99  BUN 7  CREATININE 0.51  CALCIUM 8.9   GFR: Estimated Creatinine Clearance: 71.1 mL/min (by C-G formula based on Cr of 0.51). Liver Function Tests:  Recent Labs Lab 10/01/15 0030  AST 135*  ALT 98*  ALKPHOS 145*  BILITOT 0.8  PROT 7.3  ALBUMIN 3.5    Recent Labs Lab 10/01/15 0030  LIPASE 202*   Cardiac Enzymes:  Recent Labs Lab 10/01/15 0030  TROPONINI <0.03   Urine analysis:    Component Value Date/Time   COLORURINE YELLOW 10/01/2015 0140   APPEARANCEUR CLEAR 10/01/2015 0140   LABSPEC >1.030* 10/01/2015 0140   PHURINE 5.5 10/01/2015 0140   GLUCOSEU NEGATIVE 10/01/2015 0140   HGBUR NEGATIVE 10/01/2015 0140   BILIRUBINUR NEGATIVE 10/01/2015 0140   KETONESUR NEGATIVE 10/01/2015 0140   PROTEINUR NEGATIVE 10/01/2015 0140   UROBILINOGEN 0.2 11/26/2014 0915   NITRITE NEGATIVE 10/01/2015 0140   LEUKOCYTESUR NEGATIVE 10/01/2015 0140   Radiological Exams on Admission: Ct Abdomen Pelvis W Contrast  10/01/2015  CLINICAL DATA:  Epigastric pain radiating to the chest. Started yesterday. EXAM: CT ABDOMEN  AND PELVIS WITH CONTRAST TECHNIQUE: Multidetector CT imaging of the abdomen and pelvis was performed using the standard protocol following bolus  administration of intravenous contrast. CONTRAST:  174mL ISOVUE-300 IOPAMIDOL (ISOVUE-300) INJECTION 61% COMPARISON:  03/16/2015 FINDINGS: Patchy airspace disease in both lower lungs and in the perihilar regions. This could represent multifocal pneumonia or pulmonary edema. Residual contrast material in the lower esophagus could indicate reflux or dysmotility. Surgical clips at the EG junction. Diffuse fatty infiltration of the liver. Surgical absence of the gallbladder. No bile duct dilatation. Postoperative changes in the right upper quadrant consistent with prior right adrenalectomy and partial gastrectomy with small bowel anastomosis. There is infiltration and edema in the fat around the head and body of the pancreas as well as around the C-loop of the duodenum with extension along the anterior retroperitoneum and up around the celiac axis. This is consistent with inflammatory process, likely pancreatitis or duodenitis. Follow-up after resolution of acute process is recommended to exclude underlying pancreatic lesion. 2 cm left adrenal gland nodule is unchanged since previous study. The spleen, kidneys, inferior vena cava, and retroperitoneal lymph nodes are unremarkable. Scattered calcification in the abdominal aorta without aneurysm. Stomach, small bowel, and colon are not abnormally distended. No free air or free fluid in the abdomen. There is scarring in the anterior abdominal wall without change since prior study. Pelvis: Appendix is not identified. Uterus is surgically absent. No pelvic mass or lymphadenopathy. No free or loculated pelvic fluid collection. Bladder wall is not thickened. No destructive bone lesions. IMPRESSION: Infiltration edema around the head of the pancreas, duodenum, and celiac axis. This is most likely inflammatory and suggest pancreatitis and/or duodenitis. Followup after resolution of acute process is recommended to exclude underlying pancreatic lesion. Unchanged left adrenal gland  nodule. Diffuse fatty infiltration of the liver. Postoperative changes in the abdomen as described. Electronically Signed   By: Lucienne Capers M.D.   On: 10/01/2015 03:07   Dg Abd Acute W/chest  10/01/2015  CLINICAL DATA:  51 year old female with abdominal pain radiating to the left chest. EXAM: DG ABDOMEN ACUTE W/ 1V CHEST COMPARISON:  Chest radiograph dated 09/25/2015 FINDINGS: Bibasilar interstitial prominence and nodularity may represent atelectatic changes. Developing pneumonia is not excluded. No focal consolidation, pleural effusion, or pneumothorax. The cardiac silhouette is within normal limits. There are multiple surgical clips in the upper abdomen and epigastric area. Bowel suture noted in the upper abdomen. There is moderate stool throughout the colon. No bowel dilatation or evidence of obstruction. No free air. A 12 mm nodular density in the right upper abdomen likely corresponds the nipple shadow. A Foley catheter extends to the bladder area. No acute osseous pathology. IMPRESSION: Constipation.  No evidence of bowel obstruction or free air. Bibasilar atelectatic changes. Developing pneumonia is not excluded. Clinical correlation is recommended. Electronically Signed   By: Anner Crete M.D.   On: 10/01/2015 01:32      Assessment/Plan 50 yo female with h/o etoh abuse, cocaine abuse with acute pancreatitis  Principal Problem:   Acute pancreatitis- likely due to etoh abuse.  Check uds.  Ivf.  Npo.  Conservative treatment.  Abdominal exam benign.  Active Problems:   Alcohol abuse- place on CIWA protocol   Abnormal CT scan of lung- noted some possible infiltrate, will cover with levaquin for possible pna.  Although clinically pt has not symptoms of pna, due to her high risk of clinical deterioration with holding abx will cover.  Consider repeating a cxr after hydration to see if  she actually fluffs out an infiltrate.  Check bnp.   History of cocaine abuse- uds pending   History of  schizoaffective disorder- noted   Bipolar disorder (HCC)-noted   Personality disorder- noted   History of gastric bypass- noted   Lupus (HCC)-noted   Essential hypertension, benign- noted   Nausea with vomiting- prn zofran ordered   Admit to medical.   DVT prophylaxis: scds and lovenox Code Status:  Full code  pcp is dr Desma Maxim MD [facility]  If 7PM-7AM, please contact night-coverage [email or web address] Password [facility]  10/01/2015, 4:38 AM

## 2015-10-01 NOTE — ED Notes (Signed)
Patient transported to X-ray 

## 2015-10-01 NOTE — ED Notes (Signed)
Pt c/o epigastric pain that radiates to chest area with n/c that started yesterday,

## 2015-10-02 DIAGNOSIS — R918 Other nonspecific abnormal finding of lung field: Secondary | ICD-10-CM

## 2015-10-02 DIAGNOSIS — K852 Alcohol induced acute pancreatitis without necrosis or infection: Principal | ICD-10-CM

## 2015-10-02 DIAGNOSIS — Z8659 Personal history of other mental and behavioral disorders: Secondary | ICD-10-CM

## 2015-10-02 DIAGNOSIS — R112 Nausea with vomiting, unspecified: Secondary | ICD-10-CM

## 2015-10-02 DIAGNOSIS — F1010 Alcohol abuse, uncomplicated: Secondary | ICD-10-CM

## 2015-10-02 DIAGNOSIS — K859 Acute pancreatitis without necrosis or infection, unspecified: Secondary | ICD-10-CM | POA: Insufficient documentation

## 2015-10-02 LAB — GLUCOSE, CAPILLARY
GLUCOSE-CAPILLARY: 87 mg/dL (ref 65–99)
Glucose-Capillary: 99 mg/dL (ref 65–99)

## 2015-10-02 MED ORDER — PROMETHAZINE HCL 25 MG/ML IJ SOLN
6.2500 mg | Freq: Four times a day (QID) | INTRAMUSCULAR | Status: DC | PRN
Start: 2015-10-02 — End: 2015-10-03
  Filled 2015-10-02: qty 1

## 2015-10-02 NOTE — Care Management Important Message (Signed)
Important Message  Patient Details  Name: Angel Price MRN: [id number] Date of Birth: [birth year]   Medicare Important Message Given:  Yes    Briant Sites, RN 10/02/2015, 12:38 PM

## 2015-10-02 NOTE — Progress Notes (Signed)
Subjective: This is a 50 years old female with history of multiple medical illnesses including alcohol abuse and pancreatitis was admitted due abdominal painand nausea and vomiting. Patient that she was drinking alcohol heavily.   Objective: Vital signs in last 24 hours: Temp:  [97.7 F (36.5 C)-98.7 F (37.1 C)] 98.5 F (36.9 C) (07/17 0650) Pulse Rate:  [92-100] 100 (07/17 0650) Resp:  [20] 20 (07/17 0650) BP: (103-138)/(78-100) 103/78 mmHg (07/17 0650) SpO2:  [93 %-95 %] 93 % (07/17 0650) Weight change:  Last BM Date: 09/30/15  Intake/Output from previous day:    PHYSICAL EXAM General appearance: alert and no distress Resp: clear to auscultation bilaterally Cardio: S1, S2 normal GI: mild LUQ tenderness, multiple surgical sugar, ++ bowel sound Extremities: extremities normal, atraumatic, no cyanosis or edema  Lab Results:  Results for orders placed or performed during the hospital encounter of 10/01/15 (from the past 48 hour(s))  CBC with Differential/Platelet     Status: Abnormal   Collection Time: 10/01/15 12:30 AM  Result Value Ref Range   WBC 6.1 4.0 - 10.5 K/uL   RBC 3.84 (L) 3.87 - 5.11 MIL/uL   Hemoglobin 11.6 (L) 12.0 - 15.0 g/dL   HCT 34.0 (L) 36.0 - 46.0 %   MCV 88.5 78.0 - 100.0 fL   MCH 30.2 26.0 - 34.0 pg   MCHC 34.1 30.0 - 36.0 g/dL   RDW 17.5 (H) 11.5 - 15.5 %   Platelets 268 150 - 400 K/uL   Neutrophils Relative % 66 %   Neutro Abs 4.0 1.7 - 7.7 K/uL   Lymphocytes Relative 18 %   Lymphs Abs 1.1 0.7 - 4.0 K/uL   Monocytes Relative 16 %   Monocytes Absolute 1.0 0.1 - 1.0 K/uL   Eosinophils Relative 0 %   Eosinophils Absolute 0.0 0.0 - 0.7 K/uL   Basophils Relative 0 %   Basophils Absolute 0.0 0.0 - 0.1 K/uL  Comprehensive metabolic panel     Status: Abnormal   Collection Time: 10/01/15 12:30 AM  Result Value Ref Range   Sodium 136 135 - 145 mmol/L   Potassium 3.8 3.5 - 5.1 mmol/L   Chloride 103 101 - 111 mmol/L   CO2 22 22 - 32 mmol/L    Glucose, Bld 99 65 - 99 mg/dL   BUN 7 6 - 20 mg/dL   Creatinine, Ser 0.51 0.44 - 1.00 mg/dL   Calcium 8.9 8.9 - 10.3 mg/dL   Total Protein 7.3 6.5 - 8.1 g/dL   Albumin 3.5 3.5 - 5.0 g/dL   AST 135 (H) 15 - 41 U/L   ALT 98 (H) 14 - 54 U/L   Alkaline Phosphatase 145 (H) 38 - 126 U/L   Total Bilirubin 0.8 0.3 - 1.2 mg/dL   GFR calc non Af Amer >60 >60 mL/min   GFR calc Af Amer >60 >60 mL/min    Comment: (NOTE) The eGFR has been calculated using the CKD EPI equation. This calculation has not been validated in all clinical situations. eGFR's persistently <60 mL/min signify possible Chronic Kidney Disease.    Anion gap 11 5 - 15  Lipase, blood     Status: Abnormal   Collection Time: 10/01/15 12:30 AM  Result Value Ref Range   Lipase 202 (H) 11 - 51 U/L  Troponin I     Status: None   Collection Time: 10/01/15 12:30 AM  Result Value Ref Range   Troponin I <0.03 <0.03 ng/mL  Ethanol  Status: Abnormal   Collection Time: 10/01/15 12:30 AM  Result Value Ref Range   Alcohol, Ethyl (B) 19 (H) <5 mg/dL    Comment:        LOWEST DETECTABLE LIMIT FOR SERUM ALCOHOL IS 5 mg/dL FOR MEDICAL PURPOSES ONLY   Urinalysis, Routine w reflex microscopic (not at [facility])     Status: Abnormal   Collection Time: 10/01/15  1:40 AM  Result Value Ref Range   Color, Urine YELLOW YELLOW   APPearance CLEAR CLEAR   Specific Gravity, Urine >1.030 (H) 1.005 - 1.030   pH 5.5 5.0 - 8.0   Glucose, UA NEGATIVE NEGATIVE mg/dL   Hgb urine dipstick NEGATIVE NEGATIVE   Bilirubin Urine NEGATIVE NEGATIVE   Ketones, ur NEGATIVE NEGATIVE mg/dL   Protein, ur NEGATIVE NEGATIVE mg/dL   Nitrite NEGATIVE NEGATIVE   Leukocytes, UA NEGATIVE NEGATIVE    Comment: MICROSCOPIC NOT DONE ON URINES WITH NEGATIVE PROTEIN, BLOOD, LEUKOCYTES, NITRITE, OR GLUCOSE <1000 mg/dL.  Urine rapid drug screen (hosp performed)     Status: Abnormal   Collection Time: 10/01/15  1:40 AM  Result Value Ref Range   Opiates POSITIVE (A) NONE  DETECTED   Cocaine NONE DETECTED NONE DETECTED   Benzodiazepines NONE DETECTED NONE DETECTED   Amphetamines NONE DETECTED NONE DETECTED   Tetrahydrocannabinol NONE DETECTED NONE DETECTED   Barbiturates NONE DETECTED NONE DETECTED    Comment:        DRUG SCREEN FOR MEDICAL PURPOSES ONLY.  IF CONFIRMATION IS NEEDED FOR ANY PURPOSE, NOTIFY LAB WITHIN 5 DAYS.        LOWEST DETECTABLE LIMITS FOR URINE DRUG SCREEN Drug Class       Cutoff (ng/mL) Amphetamine      1000 Barbiturate      200 Benzodiazepine   349 Tricyclics       179 Opiates          300 Cocaine          300 THC              50   Culture, blood (Routine X 2) w Reflex to ID Panel     Status: None (Preliminary result)   Collection Time: 10/01/15  4:23 AM  Result Value Ref Range   Specimen Description RIGHT ANTECUBITAL    Special Requests BOTTLES DRAWN AEROBIC ONLY 6CC    Culture NO GROWTH < 12 HOURS    Report Status PENDING   Culture, blood (Routine X 2) w Reflex to ID Panel     Status: None (Preliminary result)   Collection Time: 10/01/15  4:40 AM  Result Value Ref Range   Specimen Description BLOOD RIGHT HAND    Special Requests BOTTLES DRAWN AEROBIC ONLY 6CC    Culture NO GROWTH < 12 HOURS    Report Status PENDING   MRSA PCR Screening     Status: Abnormal   Collection Time: 10/01/15  6:13 AM  Result Value Ref Range   MRSA by PCR POSITIVE (A) NEGATIVE    Comment:        The GeneXpert MRSA Assay (FDA approved for NASAL specimens only), is one component of a comprehensive MRSA colonization surveillance program. It is not intended to diagnose MRSA infection nor to guide or monitor treatment for MRSA infections. RESULT CALLED TO, READ BACK BY AND VERIFIED WITH: HAWKINS,B AT 0943 ON 10/01/15 BY MOSLEY,J   CBC     Status: Abnormal   Collection Time: 10/01/15  6:22 AM  Result Value Ref  Range   WBC 7.6 4.0 - 10.5 K/uL   RBC 3.41 (L) 3.87 - 5.11 MIL/uL   Hemoglobin 10.2 (L) 12.0 - 15.0 g/dL   HCT 30.9 (L) 36.0  - 46.0 %   MCV 90.6 78.0 - 100.0 fL   MCH 29.9 26.0 - 34.0 pg   MCHC 33.0 30.0 - 36.0 g/dL   RDW 17.6 (H) 11.5 - 15.5 %   Platelets 235 150 - 400 K/uL  Comprehensive metabolic panel     Status: Abnormal   Collection Time: 10/01/15  6:22 AM  Result Value Ref Range   Sodium 138 135 - 145 mmol/L   Potassium 3.4 (L) 3.5 - 5.1 mmol/L   Chloride 103 101 - 111 mmol/L   CO2 25 22 - 32 mmol/L   Glucose, Bld 69 65 - 99 mg/dL   BUN 7 6 - 20 mg/dL   Creatinine, Ser 0.42 (L) 0.44 - 1.00 mg/dL   Calcium 8.4 (L) 8.9 - 10.3 mg/dL   Total Protein 6.2 (L) 6.5 - 8.1 g/dL   Albumin 2.8 (L) 3.5 - 5.0 g/dL   AST 115 (H) 15 - 41 U/L   ALT 79 (H) 14 - 54 U/L   Alkaline Phosphatase 123 38 - 126 U/L   Total Bilirubin 0.9 0.3 - 1.2 mg/dL   GFR calc non Af Amer >60 >60 mL/min   GFR calc Af Amer >60 >60 mL/min    Comment: (NOTE) The eGFR has been calculated using the CKD EPI equation. This calculation has not been validated in all clinical situations. eGFR's persistently <60 mL/min signify possible Chronic Kidney Disease.    Anion gap 10 5 - 15  Protime-INR     Status: Abnormal   Collection Time: 10/01/15  6:22 AM  Result Value Ref Range   Prothrombin Time 15.5 (H) 11.6 - 15.2 seconds   INR 1.22 0.00 - 1.49    ABGS No results for input(s): PHART, PO2ART, TCO2, HCO3 in the last 72 hours.  Invalid input(s): PCO2 CULTURES Recent Results (from the past 240 hour(s))  Culture, blood (Routine X 2) w Reflex to ID Panel     Status: None (Preliminary result)   Collection Time: 10/01/15  4:23 AM  Result Value Ref Range Status   Specimen Description RIGHT ANTECUBITAL  Final   Special Requests BOTTLES DRAWN AEROBIC ONLY 6CC  Final   Culture NO GROWTH < 12 HOURS  Final   Report Status PENDING  Incomplete  Culture, blood (Routine X 2) w Reflex to ID Panel     Status: None (Preliminary result)   Collection Time: 10/01/15  4:40 AM  Result Value Ref Range Status   Specimen Description BLOOD RIGHT HAND  Final    Special Requests BOTTLES DRAWN AEROBIC ONLY 6CC  Final   Culture NO GROWTH < 12 HOURS  Final   Report Status PENDING  Incomplete  MRSA PCR Screening     Status: Abnormal   Collection Time: 10/01/15  6:13 AM  Result Value Ref Range Status   MRSA by PCR POSITIVE (A) NEGATIVE Final    Comment:        The GeneXpert MRSA Assay (FDA approved for NASAL specimens only), is one component of a comprehensive MRSA colonization surveillance program. It is not intended to diagnose MRSA infection nor to guide or monitor treatment for MRSA infections. RESULT CALLED TO, READ BACK BY AND VERIFIED WITH: HAWKINS,B AT 0943 ON 10/01/15 BY MOSLEY,J    Studies/Results: Ct Abdomen Pelvis W Contrast  10/01/2015  CLINICAL DATA:  Epigastric pain radiating to the chest. Started yesterday. EXAM: CT ABDOMEN AND PELVIS WITH CONTRAST TECHNIQUE: Multidetector CT imaging of the abdomen and pelvis was performed using the standard protocol following bolus administration of intravenous contrast. CONTRAST:  167m ISOVUE-300 IOPAMIDOL (ISOVUE-300) INJECTION 61% COMPARISON:  03/16/2015 FINDINGS: Patchy airspace disease in both lower lungs and in the perihilar regions. This could represent multifocal pneumonia or pulmonary edema. Residual contrast material in the lower esophagus could indicate reflux or dysmotility. Surgical clips at the EG junction. Diffuse fatty infiltration of the liver. Surgical absence of the gallbladder. No bile duct dilatation. Postoperative changes in the right upper quadrant consistent with prior right adrenalectomy and partial gastrectomy with small bowel anastomosis. There is infiltration and edema in the fat around the head and body of the pancreas as well as around the C-loop of the duodenum with extension along the anterior retroperitoneum and up around the celiac axis. This is consistent with inflammatory process, likely pancreatitis or duodenitis. Follow-up after resolution of acute process is  recommended to exclude underlying pancreatic lesion. 2 cm left adrenal gland nodule is unchanged since previous study. The spleen, kidneys, inferior vena cava, and retroperitoneal lymph nodes are unremarkable. Scattered calcification in the abdominal aorta without aneurysm. Stomach, small bowel, and colon are not abnormally distended. No free air or free fluid in the abdomen. There is scarring in the anterior abdominal wall without change since prior study. Pelvis: Appendix is not identified. Uterus is surgically absent. No pelvic mass or lymphadenopathy. No free or loculated pelvic fluid collection. Bladder wall is not thickened. No destructive bone lesions. IMPRESSION: Infiltration edema around the head of the pancreas, duodenum, and celiac axis. This is most likely inflammatory and suggest pancreatitis and/or duodenitis. Followup after resolution of acute process is recommended to exclude underlying pancreatic lesion. Unchanged left adrenal gland nodule. Diffuse fatty infiltration of the liver. Postoperative changes in the abdomen as described. Electronically Signed   By: WLucienne CapersM.D.   On: 10/01/2015 03:07   Dg Abd Acute W/chest  10/01/2015  CLINICAL DATA:  50year old female with abdominal pain radiating to the left chest. EXAM: DG ABDOMEN ACUTE W/ 1V CHEST COMPARISON:  Chest radiograph dated 09/25/2015 FINDINGS: Bibasilar interstitial prominence and nodularity may represent atelectatic changes. Developing pneumonia is not excluded. No focal consolidation, pleural effusion, or pneumothorax. The cardiac silhouette is within normal limits. There are multiple surgical clips in the upper abdomen and epigastric area. Bowel suture noted in the upper abdomen. There is moderate stool throughout the colon. No bowel dilatation or evidence of obstruction. No free air. A 12 mm nodular density in the right upper abdomen likely corresponds the nipple shadow. A Foley catheter extends to the bladder area. No acute  osseous pathology. IMPRESSION: Constipation.  No evidence of bowel obstruction or free air. Bibasilar atelectatic changes. Developing pneumonia is not excluded. Clinical correlation is recommended. Electronically Signed   By: AAnner CreteM.D.   On: 10/01/2015 01:32    Medications: I have reviewed the patient's current medications.  Assesment:   Principal Problem:   Acute pancreatitis Active Problems:   History of cocaine abuse   History of schizoaffective disorder   Bipolar disorder (HCC)   Personality disorder   Alcohol abuse   History of gastric bypass   Lupus (HCC)   Essential hypertension, benign   Nausea with vomiting   Abnormal CT scan of lung    Plan:  Medications reviewed Will keep NPO for now Continue maintenance  fluid Cbc//bmp/lipase in AM GI consult Continue pain management Watch for DT    LOS: 1 day   Krystiana Fornes 10/02/2015, 7:45 AM

## 2015-10-02 NOTE — Consult Note (Signed)
Referring Provider: No ref. provider found Primary Care Physician:  Rosita Fire, MD Primary Gastroenterologist:  Dr. Gala Romney  Date of Admission: 10/01/15 Date of Consultation: 10/02/15  Reason for Consultation:  Acute pancreatitis  HPI:  Angel Price is a 50 y.o. female with a past medical history of lupus, chronic abdominal pain, adrenal mass, GERD, gastroparesis, chronic/recurrent nausea and vomiting, alcohol abuse presented to the ER on 10/01/15 just after midnight complaining of constant abdominal pain radiating to the left chest, intermittent N/V. Denies fever, chills. Recent heavy ETOH use in the setting of chronic ETOH abuse and longest period of sobriety of 1 week in the past 2 years. Last drink of ETOH was the evening prior to presentation. At the time of presentation also denied diarrhea, cough, dyspnea.  Labs show positive ethanol (19), negative troponins, elevated lipase at 202, elevated LFTs with AST/ALT 135/98, Alk phos 145; bilirubin normal, BUN/Cr normal. CBC with normal WBC, low H/H of 11.6/34.0 but higher than baseline of 9.4-10.6; plt normal. INR normal 1.22; MRSA screen positive. CT abdomen/pelvis with infiltration edema around the head of the pancreas, duodenum, and celiac axis most likely inflammatory and suggestive of pancreatitis; fatty liver; recommend follow-up after acute resolution to exclude underlying pancreatic lesion.  She was admitted for acute pancreatitis, made NPO, fluid bolus x 1L, fluids with vitamins (banana bag) at 100 mL/hr.  She was placed on MRSA precautions, CIWA protocol, pain/nausea management with dilaudid and Zofran, Protonix for GERD, and antibiotics for CT lung with possible infiltrate to cover for pneumonia.  Today she confirms the above information. Admits to having chills prior to presentation but no fever. Drank a 12-pack of beer the night she presented to the ER and "a few beers on and off" in the days leading up to. Her pain is periumbilical  and sharp. Pain medication hasn't helped significantly at this point. Has a persistent productive cough this morning which she states she has had for about 2 weeks. No hematemesis. Denies hematochezia, melena. No other upper or lower GI symptoms at this time.  Past Medical History  Diagnosis Date  . Lupus ([place])   . 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 Hypertension   . Abscess     soft tissue  . Suicide attempt ([place])   . Blood transfusion without reported diagnosis   . Chronic abdominal pain   . Chronic wound infection of abdomen ([place])   . Adrenal mass ([place])   . Anxiety   . Depression   . GERD (gastroesophageal reflux disease)   . Sickle cell trait ([place])   . Gastroparesis Nov 2015  . Nausea and vomiting     chronic, recurrent  . Alcohol abuse   . History of Billroth II operation   . Lung nodule < 6cm on CT 04/25/2014  . Hemorrhoid     internal large  . Diverticulosis     colonoscopy 04/2014 moderat pan colonic  . Colon polyp     colonoscopy 04/2014  . Gastritis     EGD 05/2014  . Hiatal hernia   . Schatzki's ring     patent per EGD 04/2014  . Lung nodule     CT 02/2014 needs repeat 1 month    Past Surgical History  Procedure Laterality Date  . Abdominal surgery    . Abdominal hysterectomy  2013    [place]  . Debridement of abdominal wall abscess N/A 02/08/2013    Procedure: DEBRIDEMENT OF ABDOMINAL WALL ABSCESS;  Surgeon: Elta Guadeloupe  Lowella Petties, MD;  Location: [facility];  Service: General;  Laterality: N/A;  . Billroth ii procedure       [place], first 2000, 2005/2006.  . Colonoscopy      in [place]  . Cholecystectomy    . Colonoscopy with propofol N/A 05/20/2013    Dr.Rourk- inadequate prep, normal appearing rectum, grossly normal colon aside from pancolonic diverticula, normal terminal ileum bx= unremarkable colonic mucosa. Due for early interval 2016.   Marland Kitchen Esophagogastroduodenoscopy (egd) with propofol N/A 05/20/2013    Dr.Rourk- s/p prior gastric surgery with normal  esophagus, residual gastric mucosa and patent efferent limb  . Biopsy  05/20/2013    Procedure: BIOPSIES OF ASCENDING AND SIGMOID COLON;  Surgeon: Daneil Dolin, MD;  Location: [facility];  Service: Endoscopy;;  . Wound exploration Right 06/24/2013    Procedure: exploration of traumatic wound right wrist;  Surgeon: Tennis Must, MD;  Location: [place];  Service: Orthopedics;  Laterality: Right;  . Tendon repar Right     wrist  . Adrenal turmor removal    . Esophagogastroduodenoscopy (egd) with propofol N/A 02/03/2014    Dr. Gala Romney:  s/p hemigastrectomy with retained gastric contents. Residual gastric mucosa and efferent limb appeared normal otherwise. Query gastroparesis.   . Colonoscopy with propofol N/A 04/26/2014    HYI:FOYDXA ileum/one colon polyp removed/moderate pan-colonic diverticulosis/large internal hemorrhoids  . Esophagogastroduodenoscopy (egd) with propofol N/A 04/26/2014    JOI:NOMVEHMC'N ring/small HH/mild non-erosive gasrtitis/normal anastomosis  . Biopsy  04/26/2014    Procedure: BIOPSIES;  Surgeon: Danie Binder, MD;  Location: [facility];  Service: Endoscopy;;  . Colonoscopy with propofol N/A 12/23/2014    Dr.Rourk- minimal internal hemorrhoids, pancolonic diverticulosis  . Esophagogastroduodenoscopy (egd) with propofol N/A 12/23/2014    Dr.Rourk- s/p prior hemigastrctomy, active oozing from anastomotic suture site, hemostasis achieved  . Agile capsule N/A 01/05/2015    Procedure: AGILE CAPSULE;  Surgeon: Daneil Dolin, MD;  Location: [facility];  Service: Endoscopy;  Laterality: N/A;  0700    Prior to Admission medications   Medication Sig Start Date End Date Taking? Authorizing Provider  fluticasone (FLONASE) 50 MCG/ACT nasal spray Place 1 spray into both nostrils daily.   Yes Historical Provider, MD  lisinopril (PRINIVIL,ZESTRIL) 10 MG tablet Take 10 mg by mouth daily.   Yes Historical Provider, MD  metoCLOPramide (REGLAN) 10 MG tablet Take 1 tablet (10 mg total) by mouth 4 (four)  times daily -  before meals and at bedtime. 04/06/14  Yes Lezlie Octave Black, NP  ondansetron (ZOFRAN) 4 MG tablet Take 1 tablet (4 mg total) by mouth every 6 (six) hours. 06/23/15  Yes Carlis Stable, NP  pantoprazole (PROTONIX) 40 MG tablet Take 1 tablet (40 mg total) by mouth 2 (two) times daily before a meal. Patient taking differently: Take 40 mg by mouth daily.  09/04/14  Yes Daleen Bo, MD  sucralfate (CARAFATE) 1 G tablet Take 1 g by mouth 4 (four) times daily.   Yes Historical Provider, MD  traMADol (ULTRAM) 50 MG tablet Take 1 tablet (50 mg total) by mouth 3 (three) times daily as needed. 03/13/15  Yes Virgel Manifold, MD  dexamethasone (DECADRON) 4 MG tablet Take 1 tablet (4 mg total) by mouth 2 (two) times daily with a meal. Patient not taking: Reported on 10/01/2015 09/25/15   Lily Kocher, PA-C    Current Facility-Administered Medications  Medication Dose Route Frequency Provider Last Rate Last Dose  . Chlorhexidine Gluconate Cloth 2 % PADS 6  each  6 each Topical Q0600 Rosita Fire, MD   6 each at 10/01/15 1000  . dextrose 5 %-0.9 % sodium chloride infusion   Intravenous Continuous Rosita Fire, MD 75 mL/hr at 10/02/15 0320    . diphenhydrAMINE (BENADRYL) capsule 25 mg  25 mg Oral Q6H PRN Doree Albee, MD   25 mg at 10/02/15 0142  . enoxaparin (LOVENOX) injection 40 mg  40 mg Subcutaneous Q24H Phillips Grout, MD   40 mg at 10/01/15 1057  . folic acid (FOLVITE) tablet 1 mg  1 mg Oral Daily Phillips Grout, MD   1 mg at 10/01/15 1057  . guaiFENesin-dextromethorphan (ROBITUSSIN DM) 100-10 MG/5ML syrup 10 mL  10 mL Oral Q6H PRN Rosita Fire, MD      . HYDROmorphone (DILAUDID) injection 1 mg  1 mg Intravenous Q4H PRN Rosita Fire, MD   1 mg at 10/02/15 0643  . levofloxacin (LEVAQUIN) IVPB 750 mg  750 mg Intravenous Q24H Orpah Greek, MD 100 mL/hr at 10/02/15 0319 750 mg at 10/02/15 0319  . LORazepam (ATIVAN) injection 0-4 mg  0-4 mg Intravenous Q6H Phillips Grout, MD   2 mg at  10/01/15 0539   Followed by  . [START ON 10/03/2015] LORazepam (ATIVAN) injection 0-4 mg  0-4 mg Intravenous Q12H Phillips Grout, MD      . LORazepam (ATIVAN) tablet 1 mg  1 mg Oral Q6H PRN Phillips Grout, MD   1 mg at 10/01/15 2247   Or  . LORazepam (ATIVAN) injection 1 mg  1 mg Intravenous Q6H PRN Phillips Grout, MD   1 mg at 10/02/15 0643  . multivitamin with minerals tablet 1 tablet  1 tablet Oral Daily Phillips Grout, MD   1 tablet at 10/01/15 1057  . mupirocin ointment (BACTROBAN) 2 % 1 application  1 application Nasal BID Rosita Fire, MD   1 application at 16/60/63 2200  . ondansetron (ZOFRAN) tablet 4 mg  4 mg Oral Q6H PRN Phillips Grout, MD   4 mg at 10/01/15 1904   Or  . ondansetron [facility]) injection 4 mg  4 mg Intravenous Q6H PRN Phillips Grout, MD   4 mg at 10/02/15 0160  . pantoprazole (PROTONIX) EC tablet 40 mg  40 mg Oral Daily Phillips Grout, MD   40 mg at 10/01/15 1056  . sodium chloride 0.9 % 1,000 mL with thiamine 109 mg, folic acid 1 mg, multivitamins adult 10 mL infusion   Intravenous Once Phillips Grout, MD      . sucralfate (CARAFATE) tablet 1 g  1 g Oral QID Phillips Grout, MD   1 g at 10/01/15 2027  . thiamine (VITAMIN B-1) tablet 100 mg  100 mg Oral Daily Phillips Grout, MD   100 mg at 10/01/15 1000   Or  . thiamine (B-1) injection 100 mg  100 mg Intravenous Daily Phillips Grout, MD        Allergies as of 09/30/2015 - Review Complete 09/25/2015  Allergen Reaction Noted  . Codeine Hives 02/06/2013  . Morphine and related Hives 02/06/2013    Family History  Problem Relation Age of Onset  . Brain cancer Son   . Schizophrenia Son   . Cancer Son     brain  . Breast cancer Maternal Aunt   . Bipolar disorder Maternal Aunt   . Drug abuse Maternal Aunt   . Colon cancer Maternal Grandmother     late  43s, early 31s  . Lung cancer Father   . Cancer Father     mets  . Liver disease Neg Hx   . Drug abuse Mother   . Drug abuse Sister   . Drug abuse Brother   .  Bipolar disorder Paternal Grandfather   . Bipolar disorder Cousin     Social History   Social History  . Marital Status: Legally Separated    Spouse Name: N/A  . Number of Children: N/A  . Years of Education: N/A   Occupational History  . Not on file.   Social History Main Topics  . Smoking status: Current Every Day Smoker -- 0.50 packs/day for 35 years    Types: Cigarettes  . Smokeless tobacco: Never Used  . Alcohol Use: 7.2 oz/week    12 Cans of beer, 0 Standard drinks or equivalent per week     Comment: 5 12 oz beers a day  . Drug Use: No     Comment: quit 09/2012  . Sexual Activity: Yes    Birth Control/ Protection: Surgical   Other Topics Concern  . Not on file   Social History Narrative    Review of Systems: General: Negative for anorexia, weight loss, fever, fatigue, weakness. ENT: Negative for hoarseness, difficulty swallowing. CV: Negative for chest pain, angina, palpitations, peripheral edema.  Respiratory: Negative for dyspnea at rest. Increased dyspnea with coughing. Admits productive cough. GI: See history of present illness. Derm: Some itching, improved with diphenhydramine.  Endo: Negative for unusual weight change.  Heme: Negative for bruising or bleeding. Allergy: Negative for rash or hives.   Physical Exam: Vital signs in last 24 hours: Temp:  [97.7 F (36.5 C)-98.7 F (37.1 C)] 98.5 F (36.9 C) (07/17 0650) Pulse Rate:  [92-100] 100 (07/17 0650) Resp:  [20] 20 (07/17 0650) BP: (103-138)/(78-100) 103/78 mmHg (07/17 0650) SpO2:  [93 %-95 %] 93 % (07/17 0650) Last BM Date: 09/30/15 General:   Alert,  Well-developed, well-nourished, pleasant and cooperative in NAD Head:  Normocephalic and atraumatic. Eyes:  Sclera clear, no icterus. Conjunctiva pink. Ears:  Normal auditory acuity. Lungs:  Clear throughout to auscultation. No wheezes, crackles, or rhonchi. No acute distress. Productive cough without noted heme. Heart:  Regular rate and rhythm;  no murmurs, clicks, rubs,  or gallops. Abdomen:  Soft, and nondistended. Moderate increased TTP periumbilical abdomen. No masses, hepatosplenomegaly or hernias noted. Normal to hypoactive bowel sounds.   Rectal:  Deferred.   Pulses:  Normal bilateral DP pulses noted. Extremities:  Without edema. Neurologic:  Alert and  oriented x4;  grossly normal neurologically. Psych:  Alert and cooperative. Normal mood and affect.  Intake/Output from previous day:   Intake/Output this shift:    Lab Results:  Recent Labs  10/01/15 0030 10/01/15 0622  WBC 6.1 7.6  HGB 11.6* 10.2*  HCT 34.0* 30.9*  PLT 268 235   BMET  Recent Labs  10/01/15 0030 10/01/15 0622  NA 136 138  K 3.8 3.4*  CL 103 103  CO2 22 25  GLUCOSE 99 69  BUN 7 7  CREATININE 0.51 0.42*  CALCIUM 8.9 8.4*   LFT  Recent Labs  10/01/15 0030 10/01/15 0622  PROT 7.3 6.2*  ALBUMIN 3.5 2.8*  AST 135* 115*  ALT 98* 79*  ALKPHOS 145* 123  BILITOT 0.8 0.9   PT/INR  Recent Labs  10/01/15 0622  LABPROT 15.5*  INR 1.22   Hepatitis Panel No results for input(s): HEPBSAG, HCVAB, HEPAIGM, HEPBIGM in  the last 72 hours. C-Diff No results for input(s): CDIFFTOX in the last 72 hours.  Studies/Results: Ct Abdomen Pelvis W Contrast  10/01/2015  CLINICAL DATA:  Epigastric pain radiating to the chest. Started yesterday. EXAM: CT ABDOMEN AND PELVIS WITH CONTRAST TECHNIQUE: Multidetector CT imaging of the abdomen and pelvis was performed using the standard protocol following bolus administration of intravenous contrast. CONTRAST:  162m ISOVUE-300 IOPAMIDOL (ISOVUE-300) INJECTION 61% COMPARISON:  03/16/2015 FINDINGS: Patchy airspace disease in both lower lungs and in the perihilar regions. This could represent multifocal pneumonia or pulmonary edema. Residual contrast material in the lower esophagus could indicate reflux or dysmotility. Surgical clips at the EG junction. Diffuse fatty infiltration of the liver. Surgical absence  of the gallbladder. No bile duct dilatation. Postoperative changes in the right upper quadrant consistent with prior right adrenalectomy and partial gastrectomy with small bowel anastomosis. There is infiltration and edema in the fat around the head and body of the pancreas as well as around the C-loop of the duodenum with extension along the anterior retroperitoneum and up around the celiac axis. This is consistent with inflammatory process, likely pancreatitis or duodenitis. Follow-up after resolution of acute process is recommended to exclude underlying pancreatic lesion. 2 cm left adrenal gland nodule is unchanged since previous study. The spleen, kidneys, inferior vena cava, and retroperitoneal lymph nodes are unremarkable. Scattered calcification in the abdominal aorta without aneurysm. Stomach, small bowel, and colon are not abnormally distended. No free air or free fluid in the abdomen. There is scarring in the anterior abdominal wall without change since prior study. Pelvis: Appendix is not identified. Uterus is surgically absent. No pelvic mass or lymphadenopathy. No free or loculated pelvic fluid collection. Bladder wall is not thickened. No destructive bone lesions. IMPRESSION: Infiltration edema around the head of the pancreas, duodenum, and celiac axis. This is most likely inflammatory and suggest pancreatitis and/or duodenitis. Followup after resolution of acute process is recommended to exclude underlying pancreatic lesion. Unchanged left adrenal gland nodule. Diffuse fatty infiltration of the liver. Postoperative changes in the abdomen as described. Electronically Signed   By: WLucienne CapersM.D.   On: 10/01/2015 03:07   Dg Abd Acute W/chest  10/01/2015  CLINICAL DATA:  50year old female with abdominal pain radiating to the left chest. EXAM: DG ABDOMEN ACUTE W/ 1V CHEST COMPARISON:  Chest radiograph dated 09/25/2015 FINDINGS: Bibasilar interstitial prominence and nodularity may represent  atelectatic changes. Developing pneumonia is not excluded. No focal consolidation, pleural effusion, or pneumothorax. The cardiac silhouette is within normal limits. There are multiple surgical clips in the upper abdomen and epigastric area. Bowel suture noted in the upper abdomen. There is moderate stool throughout the colon. No bowel dilatation or evidence of obstruction. No free air. A 12 mm nodular density in the right upper abdomen likely corresponds the nipple shadow. A Foley catheter extends to the bladder area. No acute osseous pathology. IMPRESSION: Constipation.  No evidence of bowel obstruction or free air. Bibasilar atelectatic changes. Developing pneumonia is not excluded. Clinical correlation is recommended. Electronically Signed   By: AAnner CreteM.D.   On: 10/01/2015 01:32    Impression: 50year old female with history of ETOH abuse recently with heavy drinking presented to the ER with symptomatic acute pancreatitis. She has been admitted for approximately 36 hours before our consult. CBC shows decreased H/H (likely hydration effect), stable BUN/Cr, improved LFTs; BUN during first 24 hrs of admission is a marker for mortality. Vitals have been stable.  Today she  is clinically stable, pain "about the same" as yesterday. On Dilaudid and Zofran with last dilaudid dose about 3 hours ago. Productive cough with no heme noted. No overt GI bleed noted. Continued productive cough likely exacerbating her abdominal pain, encouraged her to request cough medication as needed. Remains NPO at this time. Acute pancreatitis likely ETOH in etiology. I had a long discussion witht he patient about the ETOH-likely etiology to her symptoms and encouraged her to decrease and stop ETOH.  Given VS stable and normal/low BUN/Hct no indication for aggressive hydration this far out from admission and symptom onset.   Plan: 1. Continued pain and nausea management 2. Encourage cough supressent 3. Continue  fluids 4. Continue NPO (remains symptomatic) 5. Encourage ETOH cessation. 6. Check CBC, CMP, and Lipase tomorrow (already ordered) 7. Supportive measures    Walden Field, AGNP-C Adult & Gerontological Nurse Practitioner [facility]    LOS: 1 day     10/02/2015, 8:46 AM

## 2015-10-03 DIAGNOSIS — K858 Other acute pancreatitis without necrosis or infection: Secondary | ICD-10-CM

## 2015-10-03 LAB — BASIC METABOLIC PANEL
ANION GAP: 5 (ref 5–15)
BUN: <5 mg/dL — ABNORMAL LOW (ref 6–20)
CALCIUM: 8.4 mg/dL — AB (ref 8.9–10.3)
CO2: 26 mmol/L (ref 22–32)
CREATININE: 0.4 mg/dL — AB (ref 0.44–1.00)
Chloride: 104 mmol/L (ref 101–111)
GLUCOSE: 82 mg/dL (ref 65–99)
Potassium: 3.1 mmol/L — ABNORMAL LOW (ref 3.5–5.1)
Sodium: 135 mmol/L (ref 135–145)

## 2015-10-03 LAB — CBC
HCT: 28.8 % — ABNORMAL LOW (ref 36.0–46.0)
HEMOGLOBIN: 9.7 g/dL — AB (ref 12.0–15.0)
MCH: 30.5 pg (ref 26.0–34.0)
MCHC: 33.7 g/dL (ref 30.0–36.0)
MCV: 90.6 fL (ref 78.0–100.0)
PLATELETS: 178 10*3/uL (ref 150–400)
RBC: 3.18 MIL/uL — AB (ref 3.87–5.11)
RDW: 17.3 % — ABNORMAL HIGH (ref 11.5–15.5)
WBC: 6.6 10*3/uL (ref 4.0–10.5)

## 2015-10-03 LAB — LIPASE, BLOOD: LIPASE: 28 U/L (ref 11–51)

## 2015-10-03 MED ORDER — PROMETHAZINE HCL 12.5 MG PO TABS: 12.5000 mg | ORAL_TABLET | Freq: Four times a day (QID) | ORAL | Status: DC | PRN

## 2015-10-03 MED ORDER — ONDANSETRON HCL 4 MG/2ML IJ SOLN
4.0000 mg | Freq: Three times a day (TID) | INTRAMUSCULAR | Status: DC
Start: 2015-10-03 — End: 2015-10-08
  Administered 2015-10-03 – 2015-10-08 (×16): 4 mg via INTRAVENOUS
  Filled 2015-10-03 (×15): qty 2

## 2015-10-03 MED ORDER — HYDROMORPHONE HCL 1 MG/ML IJ SOLN
1.0000 mg | Freq: Four times a day (QID) | INTRAMUSCULAR | Status: DC | PRN
  Administered 2015-10-03 – 2015-10-06 (×10): 1 mg via INTRAVENOUS
  Filled 2015-10-03 (×10): qty 1

## 2015-10-03 MED ORDER — PANTOPRAZOLE SODIUM 40 MG PO TBEC
40.0000 mg | DELAYED_RELEASE_TABLET | Freq: Two times a day (BID) | ORAL | Status: DC
  Administered 2015-10-03 – 2015-10-08 (×10): 40 mg via ORAL
  Filled 2015-10-03 (×9): qty 1

## 2015-10-03 NOTE — Progress Notes (Signed)
Subjective: Continued abdominal pain, n/V "about the same" compared to yesterday. Continues with a cough, states she's been asking for cough medicine although there's only one documented instace of receiving it. Cough not as prominent/productive today. States last emesis this morning. Possible streaks of blood noted. No other upper or lower GI symptoms noted.  Objective: Vital signs in last 24 hours: Temp:  [98.2 F (36.8 C)-98.9 F (37.2 C)] 98.2 F (36.8 C) (07/18 0615) Pulse Rate:  [90-102] 90 (07/18 0615) Resp:  [17-20] 17 (07/18 0615) BP: (104-133)/(79-89) 133/89 mmHg (07/18 0615) SpO2:  [93 %-100 %] 100 % (07/18 0615) Last BM Date: 09/30/15 General:   Alert and oriented, pleasant Head:  Normocephalic and atraumatic. Eyes:  No icterus, sclera clear. Conjuctiva pink.  Heart:  S1, S2 present, no murmurs noted.  Lungs: Clear to auscultation bilaterally, without wheezing, rales, or rhonchi.  Abdomen:  Bowel sounds present, soft, non-distended. Moderate generalized TTP. No rebound or guarding.  Msk:  Symmetrical without gross deformities. Neurologic:  Alert and  oriented x4;  grossly normal neurologically. Psych:  Alert and cooperative. Normal mood and affect.  Intake/Output from previous day: 07/17 0701 - 07/18 0700 In: 0  Out: 700 [Urine:700] Intake/Output this shift:    Lab Results:  Recent Labs  10/01/15 0030 10/01/15 0622 10/03/15 0756  WBC 6.1 7.6 6.6  HGB 11.6* 10.2* 9.7*  HCT 34.0* 30.9* 28.8*  PLT 268 235 178   BMET  Recent Labs  10/01/15 0030 10/01/15 0622  NA 136 138  K 3.8 3.4*  CL 103 103  CO2 22 25  GLUCOSE 99 69  BUN 7 7  CREATININE 0.51 0.42*  CALCIUM 8.9 8.4*   LFT  Recent Labs  10/01/15 0030 10/01/15 0622  PROT 7.3 6.2*  ALBUMIN 3.5 2.8*  AST 135* 115*  ALT 98* 79*  ALKPHOS 145* 123  BILITOT 0.8 0.9   PT/INR  Recent Labs  10/01/15 0622  LABPROT 15.5*  INR 1.22   Hepatitis Panel No results for input(s): HEPBSAG,  HCVAB, HEPAIGM, HEPBIGM in the last 72 hours.   Studies/Results: No results found.  Assessment: 50 year old female with history of ETOH abuse recently with heavy drinking presented to the ER with symptomatic acute pancreatitis. She has been admitted for approximately 36 hours before our consult. CBC shows decreased H/H (likely hydration effect), stable BUN/Cr, improved LFTs; BUN during first 24 hrs of admission is a marker for mortality. Vitals have been stable.  Today she is clinically stable, pain "about the same" as yesterday. On Dilaudid and Zofran. Productive cough with no heme noted. No overt GI bleed noted. Continued productive cough likely exacerbating her abdominal pain, encouraged her to request cough medication as needed. Remains NPO at this time. Acute pancreatitis likely ETOH in etiology. I had a long discussion witht he patient about the ETOH-likely etiology to her symptoms and encouraged her to decrease and stop ETOH.  Labs today show CBC with low but stable hgb (9.7) within patient baseline, plt normal. BMP with low K 3.1, normal kidney function. Lipase resolved to normal at 28 from 202 yesterday.  Today she is symptomatically about the same. Continues with cough, appears to have only requested cough medicine once (yesterday). Cough likely exacerbating abdominal pain in the setting of pancreatitis. Labs are improved. Will continue NPO due to persistent symptoms. Continue to monitor labs. No elevated LFTs.  If continues to be symptomatic and/or develops new symptms such as fever, chills, tacycardia, etc. may need  to consider repeat imaging.    Plan: 1. Continue NPO 2. Cough medicine 3. Pain and nausea management per hospitalist 4. Follow labs (LFTs and Lipase) 5. Encourage abstinence from ETOH 6. Supportive measures   Walden Field, AGNP-C Adult & Gerontological Nurse Practitioner [facility]     LOS: 2 days    10/03/2015, 8:47 AM

## 2015-10-03 NOTE — Progress Notes (Signed)
Subjective: Patient is complaining abdominal pain more i the epigastric area. He is nauseated and vomiting. She is still NPO. Objective: Vital signs in last 24 hours: Temp:  [98.2 F (36.8 C)-98.9 F (37.2 C)] 98.2 F (36.8 C) (07/18 0615) Pulse Rate:  [90-102] 90 (07/18 0615) Resp:  [17-20] 17 (07/18 0615) BP: (104-133)/(79-89) 133/89 mmHg (07/18 0615) SpO2:  [93 %-100 %] 100 % (07/18 0615) Weight change:  Last BM Date: 09/30/15  Intake/Output from previous day: 07/17 0701 - 07/18 0700 In: 0  Out: 700 [Urine:700]  PHYSICAL EXAM General appearance: alert and no distress Resp: clear to auscultation bilaterally Cardio: S1, S2 normal GI: mild LUQ tenderness, multiple surgical sugar, ++ bowel sound Extremities: extremities normal, atraumatic, no cyanosis or edema  Lab Results:  Results for orders placed or performed during the hospital encounter of 10/01/15 (from the past 48 hour(s))  Glucose, capillary     Status: None   Collection Time: 10/02/15  8:47 AM  Result Value Ref Range   Glucose-Capillary 99 65 - 99 mg/dL  Glucose, capillary     Status: None   Collection Time: 10/02/15 12:51 PM  Result Value Ref Range   Glucose-Capillary 87 65 - 99 mg/dL    ABGS No results for input(s): PHART, PO2ART, TCO2, HCO3 in the last 72 hours.  Invalid input(s): PCO2 CULTURES Recent Results (from the past 240 hour(s))  Culture, blood (Routine X 2) w Reflex to ID Panel     Status: None (Preliminary result)   Collection Time: 10/01/15  4:23 AM  Result Value Ref Range Status   Specimen Description RIGHT ANTECUBITAL  Final   Special Requests BOTTLES DRAWN AEROBIC ONLY 6CC  Final   Culture NO GROWTH 1 DAY  Final   Report Status PENDING  Incomplete  Culture, blood (Routine X 2) w Reflex to ID Panel     Status: None (Preliminary result)   Collection Time: 10/01/15  4:40 AM  Result Value Ref Range Status   Specimen Description BLOOD RIGHT HAND  Final   Special Requests BOTTLES DRAWN  AEROBIC ONLY [place]  Final   Culture NO GROWTH 1 DAY  Final   Report Status PENDING  Incomplete  MRSA PCR Screening     Status: Abnormal   Collection Time: 10/01/15  6:13 AM  Result Value Ref Range Status   MRSA by PCR POSITIVE (A) NEGATIVE Final    Comment:        The GeneXpert MRSA Assay (FDA approved for NASAL specimens only), is one component of a comprehensive MRSA colonization surveillance program. It is not intended to diagnose MRSA infection nor to guide or monitor treatment for MRSA infections. RESULT CALLED TO, READ BACK BY AND VERIFIED WITH: HAWKINS,B AT [id number] ON 10/01/15 BY MOSLEY,J    Studies/Results: No results found.  Medications: I have reviewed the patient's current medications.  Assesment:   Principal Problem:   Acute pancreatitis Active Problems:   History of cocaine abuse   History of schizoaffective disorder   Bipolar disorder ([place])   Personality disorder   Alcohol abuse   History of gastric bypass   Lupus (HCC)   Essential hypertension, benign   Nausea with vomiting   Abnormal CT scan of lung   Pancreatitis, acute    Plan:  Medications reviewed Keep NPO Continue maintenance fluid GI consult appreciated Continue pain management Watch for DT    LOS: 2 days   Jonanthan Bolender 10/03/2015, 8:23 AM

## 2015-10-04 DIAGNOSIS — D649 Anemia, unspecified: Secondary | ICD-10-CM | POA: Insufficient documentation

## 2015-10-04 LAB — IRON AND TIBC
Iron: 59 ug/dL (ref 28–170)
SATURATION RATIOS: 18 % (ref 10.4–31.8)
TIBC: 330 ug/dL (ref 250–450)
UIBC: 271 ug/dL

## 2015-10-04 LAB — VITAMIN B12: VITAMIN B 12: 365 pg/mL (ref 180–914)

## 2015-10-04 MED ORDER — POTASSIUM CHLORIDE 10 MEQ/100ML IV SOLN
10.0000 meq | INTRAVENOUS | Status: AC
  Administered 2015-10-04 (×4): 10 meq via INTRAVENOUS
  Filled 2015-10-04: qty 100

## 2015-10-04 NOTE — Progress Notes (Signed)
Subjective:  Complains of persistent nausea and no improvement in abdominal pain. +flatus  Objective: Vital signs in last 24 hours: Temp:  [97.8 F (36.6 C)-99.2 F (37.3 C)] 98.7 F (37.1 C) (07/19 0621) Pulse Rate:  [80-96] 96 (07/19 0621) Resp:  [15-18] 15 (07/19 0621) BP: (113-138)/(82-99) 113/82 mmHg (07/19 0621) SpO2:  [92 %-100 %] 92 % (07/19 0621) Last BM Date: 10/03/15 General:   Alert,  Well-developed, well-nourished, pleasant and cooperative in NAD Head:  Normocephalic and atraumatic. Eyes:  Sclera clear, no icterus.  Abdomen:  Soft, moderate epigastric tenderness and nondistended. Normal bowel sounds, without guarding, and without rebound.   Extremities:  Without clubbing, deformity or edema. Neurologic:  Alert and  oriented x4;  grossly normal neurologically. Skin:  Intact without significant lesions or rashes. Psych:  Alert and cooperative. Normal mood and affect.  Intake/Output from previous day: 07/18 0701 - 07/19 0700 In: 360 [P.O.:360] Out: 1600 [Urine:1600] Intake/Output this shift:    Lab Results: CBC  Recent Labs  10/03/15 0756  WBC 6.6  HGB 9.7*  HCT 28.8*  MCV 90.6  PLT 178   BMET  Recent Labs  10/03/15 0756  NA 135  K 3.1*  CL 104  CO2 26  GLUCOSE 82  BUN <5*  CREATININE 0.40*  CALCIUM 8.4*   LFTs No results for input(s): BILITOT, BILIDIR, IBILI, ALKPHOS, AST, ALT, PROT, ALBUMIN in the last 72 hours.  Recent Labs  10/03/15 0756  LIPASE 28   PT/INR No results for input(s): LABPROT, INR in the last 72 hours.    Imaging Studies: Dg Chest 2 View  09/25/2015  CLINICAL DATA:  Dry cough, throat and bilateral ear pain for 1.5 months. Shortness of breath. EXAM: CHEST  2 VIEW COMPARISON:  03/29/2015 FINDINGS: The heart size and mediastinal contours are within normal limits. Both lungs are clear. The visualized skeletal structures are unremarkable. Surgical clips in the right upper quadrant. IMPRESSION: No active cardiopulmonary  disease. Electronically Signed   By: Lucienne Capers M.D.   On: 09/25/2015 21:08   Ct Abdomen Pelvis W Contrast  10/01/2015  CLINICAL DATA:  Epigastric pain radiating to the chest. Started yesterday. EXAM: CT ABDOMEN AND PELVIS WITH CONTRAST TECHNIQUE: Multidetector CT imaging of the abdomen and pelvis was performed using the standard protocol following bolus administration of intravenous contrast. CONTRAST:  162mL ISOVUE-300 IOPAMIDOL (ISOVUE-300) INJECTION 61% COMPARISON:  03/16/2015 FINDINGS: Patchy airspace disease in both lower lungs and in the perihilar regions. This could represent multifocal pneumonia or pulmonary edema. Residual contrast material in the lower esophagus could indicate reflux or dysmotility. Surgical clips at the EG junction. Diffuse fatty infiltration of the liver. Surgical absence of the gallbladder. No bile duct dilatation. Postoperative changes in the right upper quadrant consistent with prior right adrenalectomy and partial gastrectomy with small bowel anastomosis. There is infiltration and edema in the fat around the head and body of the pancreas as well as around the C-loop of the duodenum with extension along the anterior retroperitoneum and up around the celiac axis. This is consistent with inflammatory process, likely pancreatitis or duodenitis. Follow-up after resolution of acute process is recommended to exclude underlying pancreatic lesion. 2 cm left adrenal gland nodule is unchanged since previous study. The spleen, kidneys, inferior vena cava, and retroperitoneal lymph nodes are unremarkable. Scattered calcification in the abdominal aorta without aneurysm. Stomach, small bowel, and colon are not abnormally distended. No free air or free fluid in the abdomen. There is scarring in the anterior abdominal  wall without change since prior study. Pelvis: Appendix is not identified. Uterus is surgically absent. No pelvic mass or lymphadenopathy. No free or loculated pelvic fluid  collection. Bladder wall is not thickened. No destructive bone lesions. IMPRESSION: Infiltration edema around the head of the pancreas, duodenum, and celiac axis. This is most likely inflammatory and suggest pancreatitis and/or duodenitis. Followup after resolution of acute process is recommended to exclude underlying pancreatic lesion. Unchanged left adrenal gland nodule. Diffuse fatty infiltration of the liver. Postoperative changes in the abdomen as described. Electronically Signed   By: Lucienne Capers M.D.   On: 10/01/2015 03:07   Dg Abd Acute W/chest  10/01/2015  CLINICAL DATA:  50 year old female with abdominal pain radiating to the left chest. EXAM: DG ABDOMEN ACUTE W/ 1V CHEST COMPARISON:  Chest radiograph dated 09/25/2015 FINDINGS: Bibasilar interstitial prominence and nodularity may represent atelectatic changes. Developing pneumonia is not excluded. No focal consolidation, pleural effusion, or pneumothorax. The cardiac silhouette is within normal limits. There are multiple surgical clips in the upper abdomen and epigastric area. Bowel suture noted in the upper abdomen. There is moderate stool throughout the colon. No bowel dilatation or evidence of obstruction. No free air. A 12 mm nodular density in the right upper abdomen likely corresponds the nipple shadow. A Foley catheter extends to the bladder area. No acute osseous pathology. IMPRESSION: Constipation.  No evidence of bowel obstruction or free air. Bibasilar atelectatic changes. Developing pneumonia is not excluded. Clinical correlation is recommended. Electronically Signed   By: Anner Crete M.D.   On: 10/01/2015 01:32  [2 weeks]   Assessment: 50 year old female with history of ETOH abuse recently with heavy drinking presented to the ER with symptomatic acute pancreatitis. She has been admitted for approximately 36 hours before our consult. CBC shows decreased H/H (likely hydration effect), stable BUN/Cr, improved LFTs; BUN during  first 24 hrs of admission is a marker for mortality. Vitals have been stable.  Today she is clinically stable, pain "about the same" as yesterday. On Dilaudid and Zofran. Productive cough with no heme noted. No overt GI bleed noted.  Remains NPO at this time. Acute pancreatitis likely ETOH in etiology. I had a long discussion witht he patient about the ETOH-likely etiology to her symptoms and encouraged her to decrease and stop ETOH.  Today she is symptomatically about the same. States no improvement in pain since admission. States 10/10 pain and pain medication brings down to 8/10. Looks comfortable during exam today.  Labs are improved. Will continue NPO due to persistent symptoms. Continue to monitor labs. No elevated LFTs.  Anemia, likely anemia of chronic disease. Labs pending.  Plan: 1. Continue NPO status.  2. Patient will need follow-up imaging once symptoms resolved, approximately 6-8 weeks to exclude underlying pancreatic lesion per radiologist.  3. F/u pending labs.  4. Continue supportive measures.   Laureen Ochs. Bernarda Caffey [facility] [phone] 7/19/20179:15 AM     LOS: 3 days

## 2015-10-04 NOTE — Progress Notes (Signed)
Subjective: Patient continue to complain of abdominal pain, nausea and vomiting. She has also intermittent episode of cough. Patient is still NPO. Objective: Vital signs in last 24 hours: Temp:  [97.8 F (36.6 C)-99.2 F (37.3 C)] 98.7 F (37.1 C) (07/19 0621) Pulse Rate:  [80-96] 96 (07/19 0621) Resp:  [15-18] 15 (07/19 0621) BP: (113-138)/(82-99) 113/82 mmHg (07/19 0621) SpO2:  [92 %-100 %] 92 % (07/19 0621) Weight change:  Last BM Date: 10/03/15  Intake/Output from previous day: 07/18 0701 - 07/19 0700 In: 360 [P.O.:360] Out: 1600 [Urine:1600]  PHYSICAL EXAM General appearance: alert and no distress Resp: clear to auscultation bilaterally Cardio: S1, S2 normal GI: mild LUQ tenderness, multiple surgical sugar, ++ bowel sound Extremities: extremities normal, atraumatic, no cyanosis or edema  Lab Results:  Results for orders placed or performed during the hospital encounter of 10/01/15 (from the past 48 hour(s))  Glucose, capillary     Status: None   Collection Time: 10/02/15  8:47 AM  Result Value Ref Range   Glucose-Capillary 99 65 - 99 mg/dL  Glucose, capillary     Status: None   Collection Time: 10/02/15 12:51 PM  Result Value Ref Range   Glucose-Capillary 87 65 - 99 mg/dL  Lipase, blood     Status: None   Collection Time: 10/03/15  7:56 AM  Result Value Ref Range   Lipase 28 11 - 51 U/L  Basic metabolic panel     Status: Abnormal   Collection Time: 10/03/15  7:56 AM  Result Value Ref Range   Sodium 135 135 - 145 mmol/L   Potassium 3.1 (L) 3.5 - 5.1 mmol/L   Chloride 104 101 - 111 mmol/L   CO2 26 22 - 32 mmol/L   Glucose, Bld 82 65 - 99 mg/dL   BUN <5 (L) 6 - 20 mg/dL   Creatinine, Ser 0.40 (L) 0.44 - 1.00 mg/dL   Calcium 8.4 (L) 8.9 - 10.3 mg/dL   GFR calc non Af Amer >60 >60 mL/min   GFR calc Af Amer >60 >60 mL/min    Comment: (NOTE) The eGFR has been calculated using the CKD EPI equation. This calculation has not been validated in all clinical  situations. eGFR's persistently <60 mL/min signify possible Chronic Kidney Disease.    Anion gap 5 5 - 15  CBC     Status: Abnormal   Collection Time: 10/03/15  7:56 AM  Result Value Ref Range   WBC 6.6 4.0 - 10.5 K/uL   RBC 3.18 (L) 3.87 - 5.11 MIL/uL   Hemoglobin 9.7 (L) 12.0 - 15.0 g/dL   HCT 28.8 (L) 36.0 - 46.0 %   MCV 90.6 78.0 - 100.0 fL   MCH 30.5 26.0 - 34.0 pg   MCHC 33.7 30.0 - 36.0 g/dL   RDW 17.3 (H) 11.5 - 15.5 %   Platelets 178 150 - 400 K/uL    ABGS No results for input(s): PHART, PO2ART, TCO2, HCO3 in the last 72 hours.  Invalid input(s): PCO2 CULTURES Recent Results (from the past 240 hour(s))  Culture, blood (Routine X 2) w Reflex to ID Panel     Status: None (Preliminary result)   Collection Time: 10/01/15  4:23 AM  Result Value Ref Range Status   Specimen Description BLOOD RIGHT ANTECUBITAL  Final   Special Requests BOTTLES DRAWN AEROBIC ONLY 6CC  Final   Culture NO GROWTH 2 DAYS  Final   Report Status PENDING  Incomplete  Culture, blood (Routine X 2) w Reflex  to ID Panel     Status: None (Preliminary result)   Collection Time: 10/01/15  4:40 AM  Result Value Ref Range Status   Specimen Description BLOOD RIGHT HAND  Final   Special Requests BOTTLES DRAWN AEROBIC ONLY 6CC  Final   Culture NO GROWTH 2 DAYS  Final   Report Status PENDING  Incomplete  MRSA PCR Screening     Status: Abnormal   Collection Time: 10/01/15  6:13 AM  Result Value Ref Range Status   MRSA by PCR POSITIVE (A) NEGATIVE Final    Comment:        The GeneXpert MRSA Assay (FDA approved for NASAL specimens only), is one component of a comprehensive MRSA colonization surveillance program. It is not intended to diagnose MRSA infection nor to guide or monitor treatment for MRSA infections. RESULT CALLED TO, READ BACK BY AND VERIFIED WITH: HAWKINS,B AT 2725 ON 10/01/15 BY MOSLEY,J    Studies/Results: No results found.  Medications: I have reviewed the patient's current  medications.  Assesment:   Principal Problem:   Acute pancreatitis Active Problems:   History of cocaine abuse   History of schizoaffective disorder   Bipolar disorder ([place])   Personality disorder   Alcohol abuse   History of gastric bypass   Lupus (HCC)   Essential hypertension, benign   Nausea with vomiting   Abnormal CT scan of lung   Pancreatitis, acute    Plan:  Medications reviewed Keep NPO Continue maintenance fluid Continue IV ant biotics Will supplement K+ Continue pain management Watch for DT    LOS: 3 days   Angel Price 10/04/2015, 8:04 AM

## 2015-10-05 LAB — CBC
HCT: 25.9 % — ABNORMAL LOW (ref 36.0–46.0)
Hemoglobin: 8.7 g/dL — ABNORMAL LOW (ref 12.0–15.0)
MCH: 30.4 pg (ref 26.0–34.0)
MCHC: 33.6 g/dL (ref 30.0–36.0)
MCV: 90.6 fL (ref 78.0–100.0)
PLATELETS: 170 10*3/uL (ref 150–400)
RBC: 2.86 MIL/uL — ABNORMAL LOW (ref 3.87–5.11)
RDW: 17 % — AB (ref 11.5–15.5)
WBC: 6.8 10*3/uL (ref 4.0–10.5)

## 2015-10-05 LAB — BASIC METABOLIC PANEL
Anion gap: 6 (ref 5–15)
CALCIUM: 8.2 mg/dL — AB (ref 8.9–10.3)
CO2: 26 mmol/L (ref 22–32)
CREATININE: 0.43 mg/dL — AB (ref 0.44–1.00)
Chloride: 105 mmol/L (ref 101–111)
GFR calc Af Amer: >60 mL/min (ref >60)
Glucose, Bld: 95 mg/dL (ref 65–99)
POTASSIUM: 3 mmol/L — AB (ref 3.5–5.1)
SODIUM: 137 mmol/L (ref 135–145)

## 2015-10-05 LAB — FERRITIN: FERRITIN: 211 ng/mL (ref 11–307)

## 2015-10-05 MED ORDER — NICOTINE 21 MG/24HR TD PT24
21.0000 mg | MEDICATED_PATCH | Freq: Every day | TRANSDERMAL | Status: DC
  Administered 2015-10-05 – 2015-10-08 (×4): 21 mg via TRANSDERMAL
  Filled 2015-10-05 (×5): qty 1

## 2015-10-05 MED ORDER — POTASSIUM CHLORIDE 10 MEQ/100ML IV SOLN
10.0000 meq | INTRAVENOUS | Status: AC
  Administered 2015-10-05 (×4): 10 meq via INTRAVENOUS
  Filled 2015-10-05 (×2): qty 100

## 2015-10-05 NOTE — Progress Notes (Signed)
Subjective:  Continues to complain of vomiting. Streaks of blood in emesis this morning. Most of vomiting in setting of deep coughing but sometimes without. Pain minimally improved but somewhat better. +flatus but no BM.  Objective: Vital signs in last 24 hours: Temp:  [97.5 F (36.4 C)-98 F (36.7 C)] 97.5 F (36.4 C) (07/20 0627) Pulse Rate:  [78-86] 86 (07/20 0627) Resp:  [18-20] 20 (07/20 0627) BP: (124-141)/(81-98) 124/81 mmHg (07/20 0627) SpO2:  [94 %-100 %] 94 % (07/20 0627) Last BM Date: 10/03/15 General:   Alert,  Well-developed, well-nourished, pleasant and cooperative in NAD Head:  Normocephalic and atraumatic. Eyes:  Sclera clear, no icterus.  Abdomen:  Soft,moderate epig tenderness and nondistended. Normal bowel sounds, without guarding, and without rebound.   Extremities:  Without clubbing, deformity or edema. Neurologic:  Alert and  oriented x4;  grossly normal neurologically. Skin:  Intact without significant lesions or rashes. Psych:  Alert and cooperative. Normal mood and affect.  Intake/Output from previous day: 07/19 0701 - 07/20 0700 In: 7207.5 [I.V.:6457.5; IV Piggyback:750] Out: 1300 [Urine:1300] Intake/Output this shift:    Lab Results: CBC  Recent Labs  10/03/15 0756 10/05/15 0518  WBC 6.6 6.8  HGB 9.7* 8.7*  HCT 28.8* 25.9*  MCV 90.6 90.6  PLT 178 170   Lab Results  Component Value Date   IRON 59 10/02/2015   TIBC 330 10/02/2015         Lab Results  Component Value Date   VITAMINB12 365 10/02/2015     BMET  Recent Labs  10/03/15 0756 10/05/15 0518  NA 135 137  K 3.1* 3.0*  CL 104 105  CO2 26 26  GLUCOSE 82 95  BUN <5* <5*  CREATININE 0.40* 0.43*  CALCIUM 8.4* 8.2*   LFTs No results for input(s): BILITOT, BILIDIR, IBILI, ALKPHOS, AST, ALT, PROT, ALBUMIN in the last 72 hours.  Recent Labs  10/03/15 0756  LIPASE 28   PT/INR No results for input(s): LABPROT, INR in the last 72 hours.    Imaging Studies: Dg Chest 2  View  09/25/2015  CLINICAL DATA:  Dry cough, throat and bilateral ear pain for 1.5 months. Shortness of breath. EXAM: CHEST  2 VIEW COMPARISON:  03/29/2015 FINDINGS: The heart size and mediastinal contours are within normal limits. Both lungs are clear. The visualized skeletal structures are unremarkable. Surgical clips in the right upper quadrant. IMPRESSION: No active cardiopulmonary disease. Electronically Signed   By: Lucienne Capers M.D.   On: 09/25/2015 21:08   Ct Abdomen Pelvis W Contrast  10/01/2015  CLINICAL DATA:  Epigastric pain radiating to the chest. Started yesterday. EXAM: CT ABDOMEN AND PELVIS WITH CONTRAST TECHNIQUE: Multidetector CT imaging of the abdomen and pelvis was performed using the standard protocol following bolus administration of intravenous contrast. CONTRAST:  143mL ISOVUE-300 IOPAMIDOL (ISOVUE-300) INJECTION 61% COMPARISON:  03/16/2015 FINDINGS: Patchy airspace disease in both lower lungs and in the perihilar regions. This could represent multifocal pneumonia or pulmonary edema. Residual contrast material in the lower esophagus could indicate reflux or dysmotility. Surgical clips at the EG junction. Diffuse fatty infiltration of the liver. Surgical absence of the gallbladder. No bile duct dilatation. Postoperative changes in the right upper quadrant consistent with prior right adrenalectomy and partial gastrectomy with small bowel anastomosis. There is infiltration and edema in the fat around the head and body of the pancreas as well as around the C-loop of the duodenum with extension along the anterior retroperitoneum and up around the celiac axis.  This is consistent with inflammatory process, likely pancreatitis or duodenitis. Follow-up after resolution of acute process is recommended to exclude underlying pancreatic lesion. 2 cm left adrenal gland nodule is unchanged since previous study. The spleen, kidneys, inferior vena cava, and retroperitoneal lymph nodes are  unremarkable. Scattered calcification in the abdominal aorta without aneurysm. Stomach, small bowel, and colon are not abnormally distended. No free air or free fluid in the abdomen. There is scarring in the anterior abdominal wall without change since prior study. Pelvis: Appendix is not identified. Uterus is surgically absent. No pelvic mass or lymphadenopathy. No free or loculated pelvic fluid collection. Bladder wall is not thickened. No destructive bone lesions. IMPRESSION: Infiltration edema around the head of the pancreas, duodenum, and celiac axis. This is most likely inflammatory and suggest pancreatitis and/or duodenitis. Followup after resolution of acute process is recommended to exclude underlying pancreatic lesion. Unchanged left adrenal gland nodule. Diffuse fatty infiltration of the liver. Postoperative changes in the abdomen as described. Electronically Signed   By: Lucienne Capers M.D.   On: 10/01/2015 03:07   Dg Abd Acute W/chest  10/01/2015  CLINICAL DATA:  50 year old female with abdominal pain radiating to the left chest. EXAM: DG ABDOMEN ACUTE W/ 1V CHEST COMPARISON:  Chest radiograph dated 09/25/2015 FINDINGS: Bibasilar interstitial prominence and nodularity may represent atelectatic changes. Developing pneumonia is not excluded. No focal consolidation, pleural effusion, or pneumothorax. The cardiac silhouette is within normal limits. There are multiple surgical clips in the upper abdomen and epigastric area. Bowel suture noted in the upper abdomen. There is moderate stool throughout the colon. No bowel dilatation or evidence of obstruction. No free air. A 12 mm nodular density in the right upper abdomen likely corresponds the nipple shadow. A Foley catheter extends to the bladder area. No acute osseous pathology. IMPRESSION: Constipation.  No evidence of bowel obstruction or free air. Bibasilar atelectatic changes. Developing pneumonia is not excluded. Clinical correlation is  recommended. Electronically Signed   By: Anner Crete M.D.   On: 10/01/2015 01:32  [2 weeks]   Assessment: 50 year old female with history of ETOH abuse recently with heavy drinking presented to the ER with symptomatic acute pancreatitis.   Acute pancreatitis likely ETOH in etiology. I had a long discussion witht he patient about the ETOH-likely etiology to her symptoms and encouraged her to decrease and stop ETOH. Mild improvement in symptoms. Scared to try clear liquids due to fear of vomiting. States 8/10 pain, looks comfortable during exam today.  Anemia, likely anemia of chronic disease +/- multifactorial. Labs pending. She is up to date on EGD/TCS, 12/2014. Was noted to have some bleeding at anastomotic suture site during egd last year.   Plan: 1. Reevaluate later this afternoon, consider clear liquids at that time if improved. 2. F/u pending labs. Patient may require additional work up of anemia noted continued decline, or iron def. Continue to monitor for now.  Laureen Ochs. Bernarda Caffey [facility] [phone] 7/20/20179:13 AM     LOS: 4 days

## 2015-10-05 NOTE — Progress Notes (Signed)
Subjective: Patient is still NPO. She continue to complain of abdominal pain, nausea and vomiting.. Objective: Vital signs in last 24 hours: Temp:  [97.5 F (36.4 C)-98 F (36.7 C)] 97.5 F (36.4 C) (07/20 0627) Pulse Rate:  [78-86] 86 (07/20 0627) Resp:  [18-20] 20 (07/20 0627) BP: (124-141)/(81-98) 124/81 mmHg (07/20 0627) SpO2:  [94 %-100 %] 94 % (07/20 0627) Weight change:  Last BM Date: 10/03/15  Intake/Output from previous day: 07/19 0701 - 07/20 0700 In: 7207.5 [I.V.:6457.5; IV Piggyback:750] Out: 1300 [Urine:1300]  PHYSICAL EXAM General appearance: alert and no distress Resp: clear to auscultation bilaterally Cardio: S1, S2 normal GI: mild LUQ tenderness, multiple surgical sugar, ++ bowel sound Extremities: extremities normal, atraumatic, no cyanosis or edema  Lab Results:  Results for orders placed or performed during the hospital encounter of 10/01/15 (from the past 48 hour(s))  Basic metabolic panel     Status: Abnormal   Collection Time: 10/05/15  5:18 AM  Result Value Ref Range   Sodium 137 135 - 145 mmol/L   Potassium 3.0 (L) 3.5 - 5.1 mmol/L   Chloride 105 101 - 111 mmol/L   CO2 26 22 - 32 mmol/L   Glucose, Bld 95 65 - 99 mg/dL   BUN <5 (L) 6 - 20 mg/dL   Creatinine, Ser 0.43 (L) 0.44 - 1.00 mg/dL   Calcium 8.2 (L) 8.9 - 10.3 mg/dL   GFR calc non Af Amer >60 >60 mL/min   GFR calc Af Amer >60 >60 mL/min    Comment: (NOTE) The eGFR has been calculated using the CKD EPI equation. This calculation has not been validated in all clinical situations. eGFR's persistently <60 mL/min signify possible Chronic Kidney Disease.    Anion gap 6 5 - 15  CBC     Status: Abnormal   Collection Time: 10/05/15  5:18 AM  Result Value Ref Range   WBC 6.8 4.0 - 10.5 K/uL   RBC 2.86 (L) 3.87 - 5.11 MIL/uL   Hemoglobin 8.7 (L) 12.0 - 15.0 g/dL   HCT 25.9 (L) 36.0 - 46.0 %   MCV 90.6 78.0 - 100.0 fL   MCH 30.4 26.0 - 34.0 pg   MCHC 33.6 30.0 - 36.0 g/dL   RDW 17.0 (H)  11.5 - 15.5 %   Platelets 170 150 - 400 K/uL    ABGS No results for input(s): PHART, PO2ART, TCO2, HCO3 in the last 72 hours.  Invalid input(s): PCO2 CULTURES Recent Results (from the past 240 hour(s))  Culture, blood (Routine X 2) w Reflex to ID Panel     Status: None (Preliminary result)   Collection Time: 10/01/15  4:23 AM  Result Value Ref Range Status   Specimen Description BLOOD RIGHT ANTECUBITAL  Final   Special Requests BOTTLES DRAWN AEROBIC ONLY 6CC  Final   Culture NO GROWTH 3 DAYS  Final   Report Status PENDING  Incomplete  Culture, blood (Routine X 2) w Reflex to ID Panel     Status: None (Preliminary result)   Collection Time: 10/01/15  4:40 AM  Result Value Ref Range Status   Specimen Description BLOOD RIGHT HAND  Final   Special Requests BOTTLES DRAWN AEROBIC ONLY 6CC  Final   Culture NO GROWTH 3 DAYS  Final   Report Status PENDING  Incomplete  MRSA PCR Screening     Status: Abnormal   Collection Time: 10/01/15  6:13 AM  Result Value Ref Range Status   MRSA by PCR POSITIVE (A) NEGATIVE Final  Comment:        The GeneXpert MRSA Assay (FDA approved for NASAL specimens only), is one component of a comprehensive MRSA colonization surveillance program. It is not intended to diagnose MRSA infection nor to guide or monitor treatment for MRSA infections. RESULT CALLED TO, READ BACK BY AND VERIFIED WITH: HAWKINS,B AT 3912 ON 10/01/15 BY MOSLEY,J    Studies/Results: No results found.  Medications: I have reviewed the patient's current medications.  Assesment:   Principal Problem:   Acute pancreatitis Active Problems:   History of cocaine abuse   History of schizoaffective disorder   Bipolar disorder ([place])   Personality disorder   Alcohol abuse   History of gastric bypass   Lupus (HCC)   Essential hypertension, benign   Nausea with vomiting   Abnormal CT scan of lung   Pancreatitis, acute   Absolute anemia    Plan:  Medications reviewed Keep  NPO Continue maintenance fluid Continue IV ant biotics Will supplement K+     LOS: 4 days   Carrolyn Hilmes 10/05/2015, 8:17 AM

## 2015-10-06 DIAGNOSIS — Z9889 Other specified postprocedural states: Secondary | ICD-10-CM

## 2015-10-06 LAB — FOLATE RBC
FOLATE, HEMOLYSATE: 242.5 ng/mL
Folate, RBC: 922 ng/mL (ref >498)
HEMATOCRIT: 26.3 % — AB (ref 34.0–46.6)

## 2015-10-06 LAB — BASIC METABOLIC PANEL
Anion gap: 6 (ref 5–15)
BUN: <5 mg/dL — ABNORMAL LOW (ref 6–20)
CALCIUM: 8.5 mg/dL — AB (ref 8.9–10.3)
CHLORIDE: 106 mmol/L (ref 101–111)
CO2: 25 mmol/L (ref 22–32)
CREATININE: 0.32 mg/dL — AB (ref 0.44–1.00)
GFR calc Af Amer: >60 mL/min (ref >60)
GFR calc non Af Amer: >60 mL/min (ref >60)
GLUCOSE: 92 mg/dL (ref 65–99)
Potassium: 3.1 mmol/L — ABNORMAL LOW (ref 3.5–5.1)
Sodium: 137 mmol/L (ref 135–145)

## 2015-10-06 LAB — MAGNESIUM: Magnesium: 1.6 mg/dL — ABNORMAL LOW (ref 1.7–2.4)

## 2015-10-06 MED ORDER — POTASSIUM CHLORIDE CRYS ER 20 MEQ PO TBCR
40.0000 meq | EXTENDED_RELEASE_TABLET | Freq: Two times a day (BID) | ORAL | Status: DC
  Administered 2015-10-06 – 2015-10-08 (×5): 40 meq via ORAL
  Filled 2015-10-06 (×5): qty 2

## 2015-10-06 MED ORDER — HYDROMORPHONE HCL 1 MG/ML IJ SOLN
0.5000 mg | Freq: Four times a day (QID) | INTRAMUSCULAR | Status: DC | PRN
  Administered 2015-10-06 – 2015-10-08 (×8): 0.5 mg via INTRAVENOUS
  Filled 2015-10-06 (×8): qty 1

## 2015-10-06 MED ORDER — ACETAMINOPHEN 325 MG PO TABS
650.0000 mg | ORAL_TABLET | Freq: Four times a day (QID) | ORAL | Status: DC | PRN
  Administered 2015-10-06 – 2015-10-07 (×2): 650 mg via ORAL
  Filled 2015-10-06 (×2): qty 2

## 2015-10-06 NOTE — Progress Notes (Signed)
Subjective: Patient feels better today. Her abdominal pain, nausea and vomiting is less. She agree to try oral feeding. Objective: Vital signs in last 24 hours: Temp:  [98.4 F (36.9 C)-98.9 F (37.2 C)] 98.4 F (36.9 C) (07/21 2620) Pulse Rate:  [71-80] 78 (07/21 0638) Resp:  [19-20] 20 (07/21 3559) BP: (140-160)/(94-106) 160/101 mmHg (07/21 0638) SpO2:  [97 %-99 %] 99 % (07/21 7416) Weight change:  Last BM Date: 10/03/15  Intake/Output from previous day: 07/20 0701 - 07/21 0700 In: 0  Out: 1250 [Urine:1250]  PHYSICAL EXAM General appearance: alert and no distress Resp: clear to auscultation bilaterally Cardio: S1, S2 normal GI: mild LUQ tenderness, multiple surgical sugar, ++ bowel sound Extremities: extremities normal, atraumatic, no cyanosis or edema  Lab Results:  Results for orders placed or performed during the hospital encounter of 10/01/15 (from the past 48 hour(s))  Ferritin     Status: None   Collection Time: 10/05/15  5:18 AM  Result Value Ref Range   Ferritin 211 11 - 307 ng/mL    Comment: Performed at [facility] metabolic panel     Status: Abnormal   Collection Time: 10/05/15  5:18 AM  Result Value Ref Range   Sodium 137 135 - 145 mmol/L   Potassium 3.0 (L) 3.5 - 5.1 mmol/L   Chloride 105 101 - 111 mmol/L   CO2 26 22 - 32 mmol/L   Glucose, Bld 95 65 - 99 mg/dL   BUN <5 (L) 6 - 20 mg/dL   Creatinine, Ser 0.43 (L) 0.44 - 1.00 mg/dL   Calcium 8.2 (L) 8.9 - 10.3 mg/dL   GFR calc non Af Amer >60 >60 mL/min   GFR calc Af Amer >60 >60 mL/min    Comment: (NOTE) The eGFR has been calculated using the CKD EPI equation. This calculation has not been validated in all clinical situations. eGFR's persistently <60 mL/min signify possible Chronic Kidney Disease.    Anion gap 6 5 - 15  CBC     Status: Abnormal   Collection Time: 10/05/15  5:18 AM  Result Value Ref Range   WBC 6.8 4.0 - 10.5 K/uL   RBC 2.86 (L) 3.87 - 5.11 MIL/uL   Hemoglobin  8.7 (L) 12.0 - 15.0 g/dL   HCT 25.9 (L) 36.0 - 46.0 %   MCV 90.6 78.0 - 100.0 fL   MCH 30.4 26.0 - 34.0 pg   MCHC 33.6 30.0 - 36.0 g/dL   RDW 17.0 (H) 11.5 - 15.5 %   Platelets 170 150 - 400 K/uL  Basic metabolic panel     Status: Abnormal   Collection Time: 10/06/15  6:17 AM  Result Value Ref Range   Sodium 137 135 - 145 mmol/L   Potassium 3.1 (L) 3.5 - 5.1 mmol/L   Chloride 106 101 - 111 mmol/L   CO2 25 22 - 32 mmol/L   Glucose, Bld 92 65 - 99 mg/dL   BUN <5 (L) 6 - 20 mg/dL   Creatinine, Ser 0.32 (L) 0.44 - 1.00 mg/dL   Calcium 8.5 (L) 8.9 - 10.3 mg/dL   GFR calc non Af Amer >60 >60 mL/min   GFR calc Af Amer >60 >60 mL/min    Comment: (NOTE) The eGFR has been calculated using the CKD EPI equation. This calculation has not been validated in all clinical situations. eGFR's persistently <60 mL/min signify possible Chronic Kidney Disease.    Anion gap 6 5 - 15    ABGS No  results for input(s): PHART, PO2ART, TCO2, HCO3 in the last 72 hours.  Invalid input(s): PCO2 CULTURES Recent Results (from the past 240 hour(s))  Culture, blood (Routine X 2) w Reflex to ID Panel     Status: None (Preliminary result)   Collection Time: 10/01/15  4:23 AM  Result Value Ref Range Status   Specimen Description BLOOD RIGHT ANTECUBITAL  Final   Special Requests BOTTLES DRAWN AEROBIC ONLY 6CC  Final   Culture NO GROWTH 4 DAYS  Final   Report Status PENDING  Incomplete  Culture, blood (Routine X 2) w Reflex to ID Panel     Status: None (Preliminary result)   Collection Time: 10/01/15  4:40 AM  Result Value Ref Range Status   Specimen Description BLOOD RIGHT HAND  Final   Special Requests BOTTLES DRAWN AEROBIC ONLY 6CC  Final   Culture NO GROWTH 4 DAYS  Final   Report Status PENDING  Incomplete  MRSA PCR Screening     Status: Abnormal   Collection Time: 10/01/15  6:13 AM  Result Value Ref Range Status   MRSA by PCR POSITIVE (A) NEGATIVE Final    Comment:        The GeneXpert MRSA Assay  (FDA approved for NASAL specimens only), is one component of a comprehensive MRSA colonization surveillance program. It is not intended to diagnose MRSA infection nor to guide or monitor treatment for MRSA infections. RESULT CALLED TO, READ BACK BY AND VERIFIED WITH: HAWKINS,B AT 9791 ON 10/01/15 BY MOSLEY,J    Studies/Results: No results found.  Medications: I have reviewed the patient's current medications.  Assesment:   Principal Problem:   Acute pancreatitis Active Problems:   History of cocaine abuse   History of schizoaffective disorder   Bipolar disorder ([place])   Personality disorder   Alcohol abuse   History of gastric bypass   Lupus (HCC)   Essential hypertension, benign   Nausea with vomiting   Abnormal CT scan of lung   Pancreatitis, acute   Absolute anemia    Plan:  Medications reviewed Will advance diet as tolerated. Continue maintenance fluid Continue IV ant biotics Will supplement K+ Will gradually wean her of iv pain medication Watch for DT    LOS: 5 days   Angel Price 10/06/2015, 8:09 AM

## 2015-10-06 NOTE — Care Management Important Message (Signed)
Important Message  Patient Details  Name: Angel Price MRN: [id number] Date of Birth: 05/02/1965   Medicare Important Message Given:  Yes    Laelah Siravo, Chauncey Reading, RN 10/06/2015, 10:20 AM

## 2015-10-06 NOTE — Progress Notes (Signed)
Subjective: Feels somewhat improved today compared to yesterday. Not sleeping well. Persistent nausea but imroved. Sipping on water. Pain today 7/10. No heme noted other than small streaks in phlegm after prolonged coughing. Denies chest pain, dizziness. Some dyspnea after prolonged coughing episode. No further upper or lower GI symptoms.  Objective: Vital signs in last 24 hours: Temp:  [98.4 F (36.9 C)-98.9 F (37.2 C)] 98.4 F (36.9 C) (07/21 [id number]) Pulse Rate:  [71-80] 78 (07/21 0638) Resp:  [19-20] 20 (07/21 [id number]) BP: (140-160)/(94-106) 160/101 mmHg (07/21 0638) SpO2:  [97 %-99 %] 99 % (07/21 [id number]) Last BM Date: 10/03/15 General:   Alert and oriented, pleasant. No acute distress, appears to be resting well in bed. Head:  Normocephalic and atraumatic. Eyes:  No icterus, sclera clear. Conjuctiva pink.  Heart:  S1, S2 present, no murmurs noted.  Lungs: Clear to auscultation bilaterally, without wheezing, rales, or rhonchi.  Abdomen:  Bowel sounds present, soft, non-distended. TTP noted mid- and periumbilical abdomen. No HSM or hernias noted. Msk:  Symmetrical without gross deformities. Neurologic:  Alert and  oriented x4;  grossly normal neurologically. Psych:  Alert and cooperative. Normal mood and affect.  Intake/Output from previous day: 07/20 0701 - 07/21 0700 In: 0  Out: 1250 [Urine:1250] Intake/Output this shift:    Lab Results:  Recent Labs  10/05/15 0518  WBC 6.8  HGB 8.7*  HCT 25.9*  PLT 170   BMET  Recent Labs  10/05/15 0518 10/06/15 0617  NA 137 137  K 3.0* 3.1*  CL 105 106  CO2 26 25  GLUCOSE 95 92  BUN <5* <5*  CREATININE 0.43* 0.32*  CALCIUM 8.2* 8.5*   LFT No results for input(s): PROT, ALBUMIN, AST, ALT, ALKPHOS, BILITOT, BILIDIR, IBILI in the last 72 hours. PT/INR No results for input(s): LABPROT, INR in the last 72 hours. Hepatitis Panel No results for input(s): HEPBSAG, HCVAB, HEPAIGM, HEPBIGM in the last 72  hours.   Studies/Results: No results found.  Assessment: 50 year old female with history of ETOH abuse recently with heavy drinking presented to the ER with symptomatic acute pancreatitis.   Acute pancreatitis likely ETOH in etiology. I had a long discussion witht he patient about the ETOH-likely etiology to her symptoms and encouraged her to decrease and stop ETOH. Mild improvement in symptoms but persistent complaints of vomiting and 8-10/10 pain. Attempted clear liquids with noted vomiting, made NPO again. Vomiting resolved a of yesterday afternoon, decision made to hold of on diet advancement until re-evaluation this morning. Remains afebrile, vital signs stable other than some hypertension (max 160/101).  Labs this morning: hypokalemia (3.1), kidney function normal. Magnesium ordered/pending   Anemia, likely anemia of chronic disease +/- multifactorial. Labs with normal ferritin (211), folate pending. Iron/TIBC 10/02/15 normal. She is up to date on EGD/TCS, 12/2014. Was noted to have some bleeding at anastomotic suture site during egd last year.   Symptomatically somewhat improved today. Persistent but improved abdominal pain and nausea. No further emesis. Sipping on water. Is comfortable with trying clear liquids. Advised her to "take it slow." Will change diet to clears. No further imaging indicated at this time. Advised her to request cough syrup as needed due to cough likely exacerbating her pain and prolonging her recovery.   Plan: 1. Advance to clear liquids 2. Continued pain/nausea medication per attending 3. Eventual low fat diet 4. Cough control 5. Check CBC tomorrow for anemia status 6. Supportive measures 7. Electrolyte replacement per attending  Walden Field, AGNP-C Adult & Gerontological Nurse Practitioner [facility]    LOS: 5 days    10/06/2015, 8:15 AM

## 2015-10-06 NOTE — Care Management Note (Signed)
Case Management Note  Patient Details  Name: Angel Price MRN: [id number] Date of Birth: 1965-07-19  Subjective/Objective: Patient from home, independent with ADL's. Has PCP and insurance, reports no issues.              Action/Plan: Anticipate DC home with self care.   Expected Discharge Date:               10/08/2015  Expected Discharge Plan:  Home/Self Care  In-House Referral:  NA  Discharge planning Services  CM Consult  Post Acute Care Choice:  NA Choice offered to:  NA  DME Arranged:    DME Agency:     HH Arranged:    HH Agency:     Status of Service:  Completed, signed off  If discussed at [employer] of Stay Meetings, dates discussed:    Additional Comments:  Angel Price, Angel Reading, RN 10/06/2015, 2:15 PM

## 2015-10-06 NOTE — Progress Notes (Signed)
Pt complaining of headache. Received verbal order from MD to give pt Tylenol 650 mg q6 hr PRN.

## 2015-10-06 NOTE — Progress Notes (Signed)
Pt complaining of headache. MD E-Paged.  

## 2015-10-07 DIAGNOSIS — K859 Acute pancreatitis without necrosis or infection, unspecified: Secondary | ICD-10-CM

## 2015-10-07 DIAGNOSIS — D649 Anemia, unspecified: Secondary | ICD-10-CM

## 2015-10-07 DIAGNOSIS — R112 Nausea with vomiting, unspecified: Secondary | ICD-10-CM

## 2015-10-07 LAB — CULTURE, BLOOD (ROUTINE X 2)
Culture: NO GROWTH
Culture: NO GROWTH

## 2015-10-07 LAB — BASIC METABOLIC PANEL
Anion gap: 4 — ABNORMAL LOW (ref 5–15)
CALCIUM: 8.6 mg/dL — AB (ref 8.9–10.3)
CO2: 27 mmol/L (ref 22–32)
CREATININE: 0.41 mg/dL — AB (ref 0.44–1.00)
Chloride: 106 mmol/L (ref 101–111)
GFR calc non Af Amer: >60 mL/min (ref >60)
GLUCOSE: 89 mg/dL (ref 65–99)
Potassium: 3.8 mmol/L (ref 3.5–5.1)
Sodium: 137 mmol/L (ref 135–145)

## 2015-10-07 LAB — CBC
HEMATOCRIT: 30.3 % — AB (ref 36.0–46.0)
Hemoglobin: 10.6 g/dL — ABNORMAL LOW (ref 12.0–15.0)
MCH: 31.4 pg (ref 26.0–34.0)
MCHC: 35 g/dL (ref 30.0–36.0)
MCV: 89.6 fL (ref 78.0–100.0)
Platelets: 206 10*3/uL (ref 150–400)
RBC: 3.38 MIL/uL — ABNORMAL LOW (ref 3.87–5.11)
RDW: 16.9 % — AB (ref 11.5–15.5)
WBC: 6.1 10*3/uL (ref 4.0–10.5)

## 2015-10-07 NOTE — Progress Notes (Signed)
  Subjective: Patient reports some pain after eating breakfast and lunch. She feels full. She did not experience nausea and/or vomiting. She states she's been drinking as many as 12 cans of beer a day. She states she started shaking when she wakes up and drinks until she goes to bed at night. She has never been diagnosed with pancreatitis before.   Objective: Blood pressure 137/100, pulse 78, temperature 98.3 F (36.8 C), temperature source Oral, resp. rate 18, height 5\' 3"  (1.6 m), weight 135 lb 6.4 oz (61.417 kg), SpO2 100 %. Patient is alert and in no acute distress. Conjunctiva is pink. Sclera is nonicteric Cardiac exam with regular rhythm normal S1 and S2. No murmur or gallop noted. Lungs are clear to auscultation. Abdomen is symmetrical. Bowel sounds are normal. She has mild midepigastric tenderness. No organomegaly or masses. No LE edema or clubbing noted.  Labs/studies Results:   Recent Labs  10/05/15 0518 10/07/15 0500  WBC 6.8 6.1  HGB 8.7* 10.6*  HCT 25.9*  26.3* 30.3*  PLT 170 206    BMET   Recent Labs  10/05/15 0518 10/06/15 0617 10/07/15 0515  NA 137 137 137  K 3.0* 3.1* 3.8  CL 105 106 106  CO2 26 25 27   GLUCOSE 95 92 89  BUN <5* <5* <5*  CREATININE 0.43* 0.32* 0.41*  CALCIUM 8.2* 8.5* 8.6*       Assessment:  #1.Alcoholic pancreatitis. Patient experiencing postprandial pain. No nausea vomiting or fever. Will monitor. Serum calcium is coming up which is reassuring.  Recommendations:  CBC serum amylase and lipase in a.m.

## 2015-10-07 NOTE — Progress Notes (Signed)
Subjective: Patient is feeling better. She is tolerated liquid diet. Her pain is less. Objective: Vital signs in last 24 hours: Temp:  [98 F (36.7 C)-98.2 F (36.8 C)] 98 F (36.7 C) (07/22 0604) Pulse Rate:  [77-83] 77 (07/22 0604) Resp:  [15-20] 15 (07/22 0604) BP: (115-154)/(82-106) 115/82 mmHg (07/22 0604) SpO2:  [97 %-98 %] 98 % (07/22 0604) Weight:  [61.417 kg (135 lb 6.4 oz)] 61.417 kg (135 lb 6.4 oz) (07/22 0604) Weight change:  Last BM Date: 10/03/15  Intake/Output from previous day: 07/21 0701 - 07/22 0700 In: 887.5 [I.V.:887.5] Out: 3300 [Urine:3300]  PHYSICAL EXAM General appearance: alert and no distress Resp: clear to auscultation bilaterally Cardio: S1, S2 normal GI: mild LUQ tenderness, multiple surgical sugar, ++ bowel sound Extremities: extremities normal, atraumatic, no cyanosis or edema  Lab Results:  Results for orders placed or performed during the hospital encounter of 10/01/15 (from the past 48 hour(s))  Basic metabolic panel     Status: Abnormal   Collection Time: 10/06/15  6:17 AM  Result Value Ref Range   Sodium 137 135 - 145 mmol/L   Potassium 3.1 (L) 3.5 - 5.1 mmol/L   Chloride 106 101 - 111 mmol/L   CO2 25 22 - 32 mmol/L   Glucose, Bld 92 65 - 99 mg/dL   BUN <5 (L) 6 - 20 mg/dL   Creatinine, Ser 0.32 (L) 0.44 - 1.00 mg/dL   Calcium 8.5 (L) 8.9 - 10.3 mg/dL   GFR calc non Af Amer >60 >60 mL/min   GFR calc Af Amer >60 >60 mL/min    Comment: (NOTE) The eGFR has been calculated using the CKD EPI equation. This calculation has not been validated in all clinical situations. eGFR's persistently <60 mL/min signify possible Chronic Kidney Disease.    Anion gap 6 5 - 15  Magnesium     Status: Abnormal   Collection Time: 10/06/15  6:17 AM  Result Value Ref Range   Magnesium 1.6 (L) 1.7 - 2.4 mg/dL  CBC     Status: Abnormal   Collection Time: 10/07/15  5:00 AM  Result Value Ref Range   WBC 6.1 4.0 - 10.5 K/uL   RBC 3.38 (L) 3.87 - 5.11  MIL/uL   Hemoglobin 10.6 (L) 12.0 - 15.0 g/dL   HCT 30.3 (L) 36.0 - 46.0 %   MCV 89.6 78.0 - 100.0 fL   MCH 31.4 26.0 - 34.0 pg   MCHC 35.0 30.0 - 36.0 g/dL   RDW 16.9 (H) 11.5 - 15.5 %   Platelets 206 150 - 400 K/uL  Basic metabolic panel     Status: Abnormal   Collection Time: 10/07/15  5:15 AM  Result Value Ref Range   Sodium 137 135 - 145 mmol/L   Potassium 3.8 3.5 - 5.1 mmol/L    Comment: DELTA CHECK NOTED   Chloride 106 101 - 111 mmol/L   CO2 27 22 - 32 mmol/L   Glucose, Bld 89 65 - 99 mg/dL   BUN <5 (L) 6 - 20 mg/dL   Creatinine, Ser 0.41 (L) 0.44 - 1.00 mg/dL   Calcium 8.6 (L) 8.9 - 10.3 mg/dL   GFR calc non Af Amer >60 >60 mL/min   GFR calc Af Amer >60 >60 mL/min    Comment: (NOTE) The eGFR has been calculated using the CKD EPI equation. This calculation has not been validated in all clinical situations. eGFR's persistently <60 mL/min signify possible Chronic Kidney Disease.    Anion gap  4 (L) 5 - 15    ABGS No results for input(s): PHART, PO2ART, TCO2, HCO3 in the last 72 hours.  Invalid input(s): PCO2 CULTURES Recent Results (from the past 240 hour(s))  Culture, blood (Routine X 2) w Reflex to ID Panel     Status: None (Preliminary result)   Collection Time: 10/01/15  4:23 AM  Result Value Ref Range Status   Specimen Description BLOOD RIGHT ANTECUBITAL  Final   Special Requests BOTTLES DRAWN AEROBIC ONLY 6CC  Final   Culture NO GROWTH 4 DAYS  Final   Report Status PENDING  Incomplete  Culture, blood (Routine X 2) w Reflex to ID Panel     Status: None (Preliminary result)   Collection Time: 10/01/15  4:40 AM  Result Value Ref Range Status   Specimen Description BLOOD RIGHT HAND  Final   Special Requests BOTTLES DRAWN AEROBIC ONLY 6CC  Final   Culture NO GROWTH 4 DAYS  Final   Report Status PENDING  Incomplete  MRSA PCR Screening     Status: Abnormal   Collection Time: 10/01/15  6:13 AM  Result Value Ref Range Status   MRSA by PCR POSITIVE (A) NEGATIVE  Final    Comment:        The GeneXpert MRSA Assay (FDA approved for NASAL specimens only), is one component of a comprehensive MRSA colonization surveillance program. It is not intended to diagnose MRSA infection nor to guide or monitor treatment for MRSA infections. RESULT CALLED TO, READ BACK BY AND VERIFIED WITH: HAWKINS,B AT 0998 ON 10/01/15 BY MOSLEY,J    Studies/Results: No results found.  Medications: I have reviewed the patient's current medications.  Assesment:   Principal Problem:   Acute pancreatitis Active Problems:   History of cocaine abuse   History of schizoaffective disorder   Bipolar disorder ([place])   Personality disorder   Alcohol abuse   History of gastric bypass   Lupus (HCC)   Essential hypertension, benign   Nausea with vomiting   Abnormal CT scan of lung   Pancreatitis, acute   Absolute anemia    Plan:  Medications reviewed Continue to advance diet Pain medications as needed    LOS: 6 days   Angel Price 10/07/2015, 8:31 AM

## 2015-10-08 LAB — CREATININE, SERUM
Creatinine, Ser: 0.53 mg/dL (ref 0.44–1.00)
GFR calc Af Amer: >60 mL/min (ref >60)

## 2015-10-08 MED ORDER — FOLIC ACID 1 MG PO TABS: 1.0000 mg | ORAL_TABLET | Freq: Every day | ORAL | 3 refills | Status: DC

## 2015-10-08 MED ORDER — THIAMINE HCL 100 MG PO TABS: 100.0000 mg | ORAL_TABLET | Freq: Every day | ORAL | 0 refills | Status: DC

## 2015-10-08 MED ORDER — NICOTINE 21 MG/24HR TD PT24: 21.0000 mg | MEDICATED_PATCH | Freq: Every day | TRANSDERMAL | 0 refills | Status: DC

## 2015-10-08 MED ORDER — ADULT MULTIVITAMIN W/MINERALS CH: 1.0000 | ORAL_TABLET | Freq: Every day | ORAL | 3 refills | Status: DC

## 2015-10-08 NOTE — Discharge Instructions (Signed)

## 2015-10-08 NOTE — Progress Notes (Signed)
Discharge instructions and medications reviewed w/pt. Pancreatitis information sheet given with d/c instructions. She verbalizes understanding and has no questions at this time.  VSS stable and pt is leaving unit in a WC w/a NT.

## 2015-10-08 NOTE — Discharge Summary (Signed)
Physician Discharge Summary  Patient ID: Angel Price MRN: [id number] DOB/AGE: 07-07-1965 50 y.o. Primary Care Physician:Kennedy Bohanon, MD Admit date: 10/01/2015 Discharge date: 10/08/2015    Discharge Diagnoses:   Principal Problem:   Acute pancreatitis Active Problems:   History of cocaine abuse   History of schizoaffective disorder   Bipolar disorder ([name])   Personality disorder   Alcohol abuse   History of gastric bypass   Lupus ([name])   Essential hypertension, benign   Nausea with vomiting   Abnormal CT scan of lung   Pancreatitis, acute   Absolute anemia     Medication List    STOP taking these medications   dexamethasone 4 MG tablet Commonly known as:  DECADRON   metoCLOPramide 10 MG tablet Commonly known as:  REGLAN     TAKE these medications   fluticasone 50 MCG/ACT nasal spray Commonly known as:  FLONASE Place 1 spray into both nostrils daily.   folic acid 1 MG tablet Commonly known as:  FOLVITE Take 1 tablet (1 mg total) by mouth daily.   lisinopril 10 MG tablet Commonly known as:  PRINIVIL,ZESTRIL Take 10 mg by mouth daily.   multivitamin with minerals Tabs tablet Take 1 tablet by mouth daily.   nicotine 21 mg/24hr patch Commonly known as:  NICODERM CQ - dosed in mg/24 hours Place 1 patch (21 mg total) onto the skin daily.   ondansetron 4 MG tablet Commonly known as:  ZOFRAN Take 1 tablet (4 mg total) by mouth every 6 (six) hours.   pantoprazole 40 MG tablet Commonly known as:  PROTONIX Take 1 tablet (40 mg total) by mouth 2 (two) times daily before a meal. What changed:  when to take this   sucralfate 1 g tablet Commonly known as:  CARAFATE Take 1 g by mouth 4 (four) times daily.   thiamine 100 MG tablet Take 1 tablet (100 mg total) by mouth daily.   traMADol 50 MG tablet Commonly known as:  ULTRAM Take 1 tablet (50 mg total) by mouth 3 (three) times daily as needed.       Discharged Condition: improved    Consults:  GI  Significant Diagnostic Studies: Dg Chest 2 View  Result Date: 09/25/2015 CLINICAL DATA:  Dry cough, throat and bilateral ear pain for 1.5 months. Shortness of breath. EXAM: CHEST  2 VIEW COMPARISON:  03/29/2015 FINDINGS: The heart size and mediastinal contours are within normal limits. Both lungs are clear. The visualized skeletal structures are unremarkable. Surgical clips in the right upper quadrant. IMPRESSION: No active cardiopulmonary disease. Electronically Signed   By: Lucienne Capers M.D.   On: 09/25/2015 21:08   Ct Abdomen Pelvis W Contrast  Result Date: 10/01/2015 CLINICAL DATA:  Epigastric pain radiating to the chest. Started yesterday. EXAM: CT ABDOMEN AND PELVIS WITH CONTRAST TECHNIQUE: Multidetector CT imaging of the abdomen and pelvis was performed using the standard protocol following bolus administration of intravenous contrast. CONTRAST:  188mL ISOVUE-300 IOPAMIDOL (ISOVUE-300) INJECTION 61% COMPARISON:  03/16/2015 FINDINGS: Patchy airspace disease in both lower lungs and in the perihilar regions. This could represent multifocal pneumonia or pulmonary edema. Residual contrast material in the lower esophagus could indicate reflux or dysmotility. Surgical clips at the EG junction. Diffuse fatty infiltration of the liver. Surgical absence of the gallbladder. No bile duct dilatation. Postoperative changes in the right upper quadrant consistent with prior right adrenalectomy and partial gastrectomy with small bowel anastomosis. There is infiltration and edema in the fat around the head and body  of the pancreas as well as around the C-loop of the duodenum with extension along the anterior retroperitoneum and up around the celiac axis. This is consistent with inflammatory process, likely pancreatitis or duodenitis. Follow-up after resolution of acute process is recommended to exclude underlying pancreatic lesion. 2 cm left adrenal gland nodule is unchanged since previous study. The spleen,  kidneys, inferior vena cava, and retroperitoneal lymph nodes are unremarkable. Scattered calcification in the abdominal aorta without aneurysm. Stomach, small bowel, and colon are not abnormally distended. No free air or free fluid in the abdomen. There is scarring in the anterior abdominal wall without change since prior study. Pelvis: Appendix is not identified. Uterus is surgically absent. No pelvic mass or lymphadenopathy. No free or loculated pelvic fluid collection. Bladder wall is not thickened. No destructive bone lesions. IMPRESSION: Infiltration edema around the head of the pancreas, duodenum, and celiac axis. This is most likely inflammatory and suggest pancreatitis and/or duodenitis. Followup after resolution of acute process is recommended to exclude underlying pancreatic lesion. Unchanged left adrenal gland nodule. Diffuse fatty infiltration of the liver. Postoperative changes in the abdomen as described. Electronically Signed   By: Lucienne Capers M.D.   On: 10/01/2015 03:07   Dg Abd Acute W/chest  Result Date: 10/01/2015 CLINICAL DATA:  50 year old female with abdominal pain radiating to the left chest. EXAM: DG ABDOMEN ACUTE W/ 1V CHEST COMPARISON:  Chest radiograph dated 09/25/2015 FINDINGS: Bibasilar interstitial prominence and nodularity may represent atelectatic changes. Developing pneumonia is not excluded. No focal consolidation, pleural effusion, or pneumothorax. The cardiac silhouette is within normal limits. There are multiple surgical clips in the upper abdomen and epigastric area. Bowel suture noted in the upper abdomen. There is moderate stool throughout the colon. No bowel dilatation or evidence of obstruction. No free air. A 12 mm nodular density in the right upper abdomen likely corresponds the nipple shadow. A Foley catheter extends to the bladder area. No acute osseous pathology. IMPRESSION: Constipation.  No evidence of bowel obstruction or free air. Bibasilar atelectatic  changes. Developing pneumonia is not excluded. Clinical correlation is recommended. Electronically Signed   By: Anner Crete M.D.   On: 10/01/2015 01:32    Lab Results: Basic Metabolic Panel:  Recent Labs  10/06/15 0617 10/07/15 0515 10/08/15 0519  NA 137 137  --   K 3.1* 3.8  --   CL 106 106  --   CO2 25 27  --   GLUCOSE 92 89  --   BUN <5* <5*  --   CREATININE 0.32* 0.41* 0.53  CALCIUM 8.5* 8.6*  --   MG 1.6*  --   --    Liver Function Tests: No results for input(s): AST, ALT, ALKPHOS, BILITOT, PROT, ALBUMIN in the last 72 hours.   CBC:  Recent Labs  10/07/15 0500  WBC 6.1  HGB 10.6*  HCT 30.3*  MCV 89.6  PLT 206    Recent Results (from the past 240 hour(s))  Culture, blood (Routine X 2) w Reflex to ID Panel     Status: None   Collection Time: 10/01/15  4:23 AM  Result Value Ref Range Status   Specimen Description BLOOD RIGHT ANTECUBITAL  Final   Special Requests BOTTLES DRAWN AEROBIC ONLY [place]  Final   Culture NO GROWTH 6 DAYS  Final   Report Status 10/07/2015 FINAL  Final  Culture, blood (Routine X 2) w Reflex to ID Panel     Status: None   Collection Time: 10/01/15  4:40 AM  Result Value Ref Range Status   Specimen Description BLOOD RIGHT HAND  Final   Special Requests BOTTLES DRAWN AEROBIC ONLY 6CC  Final   Culture NO GROWTH 6 DAYS  Final   Report Status 10/07/2015 FINAL  Final  MRSA PCR Screening     Status: Abnormal   Collection Time: 10/01/15  6:13 AM  Result Value Ref Range Status   MRSA by PCR POSITIVE (A) NEGATIVE Final    Comment:        The GeneXpert MRSA Assay (FDA approved for NASAL specimens only), is one component of a comprehensive MRSA colonization surveillance program. It is not intended to diagnose MRSA infection nor to guide or monitor treatment for MRSA infections. RESULT CALLED TO, READ BACK BY AND VERIFIED WITH: HAWKINS,B AT [place] ON 10/01/15 BY [facility] Course:   This is a 50 years old female known case  of multiple medical illnesses was admitted due to abdominal pain, nausea and vomiting. Patient has history of pancreatitis and continued to abuse alcohol, On admission her lipase was elevated and chest x-ray also showed some infiltrate. Patient was treated as case of acute pancreatitis and pneumonia. She was kept NPO and GI consult was done. Over the hospital stay patent gradually improved. Patient tolerated regular diet. Her symptoms resolved. Patient strongly advised to avoid,alcohol, tobacco and substance abuse.  Discharge Exam: Blood pressure 120/86, pulse 84, temperature 98.1 F (36.7 C), temperature source Oral, resp. rate 20, height 5\' 3"  (1.6 m), weight 61.4 kg (135 lb 6.4 oz), SpO2 98 %.   Disposition:  home    Follow-up Information    Temiloluwa Recchia, MD Follow up in 2 week(s).   Specialty:  Internal Medicine Contact information: [place] [address] [phone]           Signed: Rosita Fire   10/08/2015, 9:22 AM

## 2015-10-13 ENCOUNTER — Telehealth: Payer: Self-pay | Admitting: Gastroenterology

## 2015-10-13 NOTE — Telephone Encounter (Signed)
Patient needs hospital f/u for pancreatitis and anemia in 6 weeks.

## 2015-10-17 ENCOUNTER — Encounter: Payer: Self-pay | Admitting: Internal Medicine

## 2015-10-17 NOTE — Telephone Encounter (Signed)
APPT MADE AND LETTER SENT  °

## 2015-10-19 ENCOUNTER — Ambulatory Visit (INDEPENDENT_AMBULATORY_CARE_PROVIDER_SITE_OTHER): Payer: Self-pay | Admitting: Otolaryngology

## 2015-10-20 IMAGING — DX DG ABDOMEN ACUTE W/ 1V CHEST
3 series · 3 of 3 positions shown · non-contrast
Comparison: Chest and abdominal radiographs, and CT of the abdomen
and pelvis performed 05/22/2014

CLINICAL DATA: Acute onset of mid abdominal pain, nausea, vomiting
and diarrhea. Rectal bleeding. Initial encounter.

EXAM:
ACUTE ABDOMEN SERIES (ABDOMEN 2 VIEW & CHEST 1 VIEW)

[chest pa]
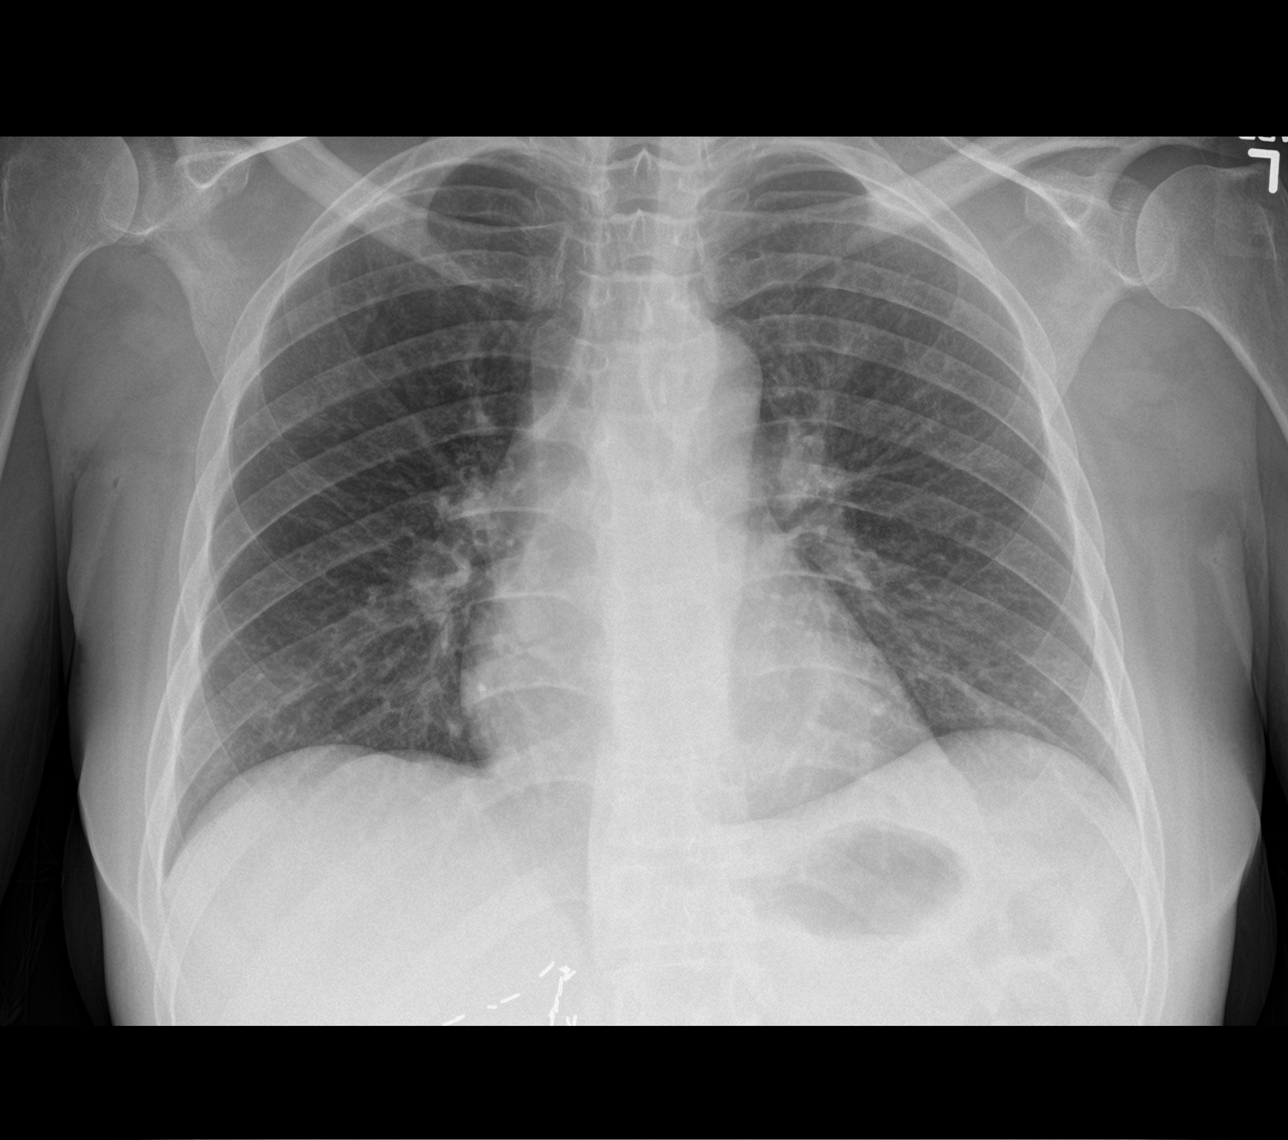

[abdomen erect]
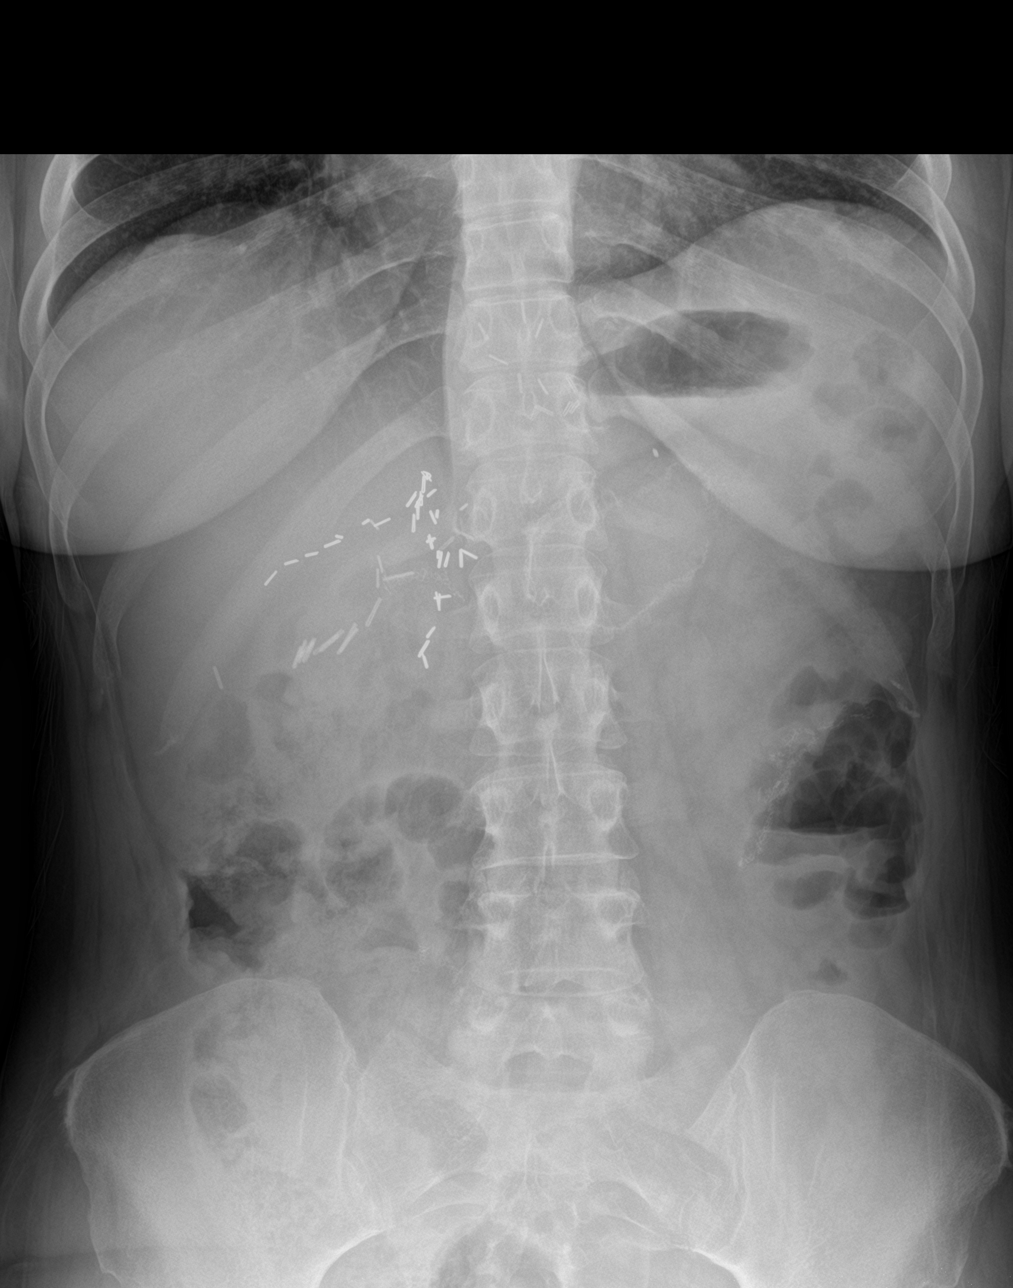

[abdomen supine]
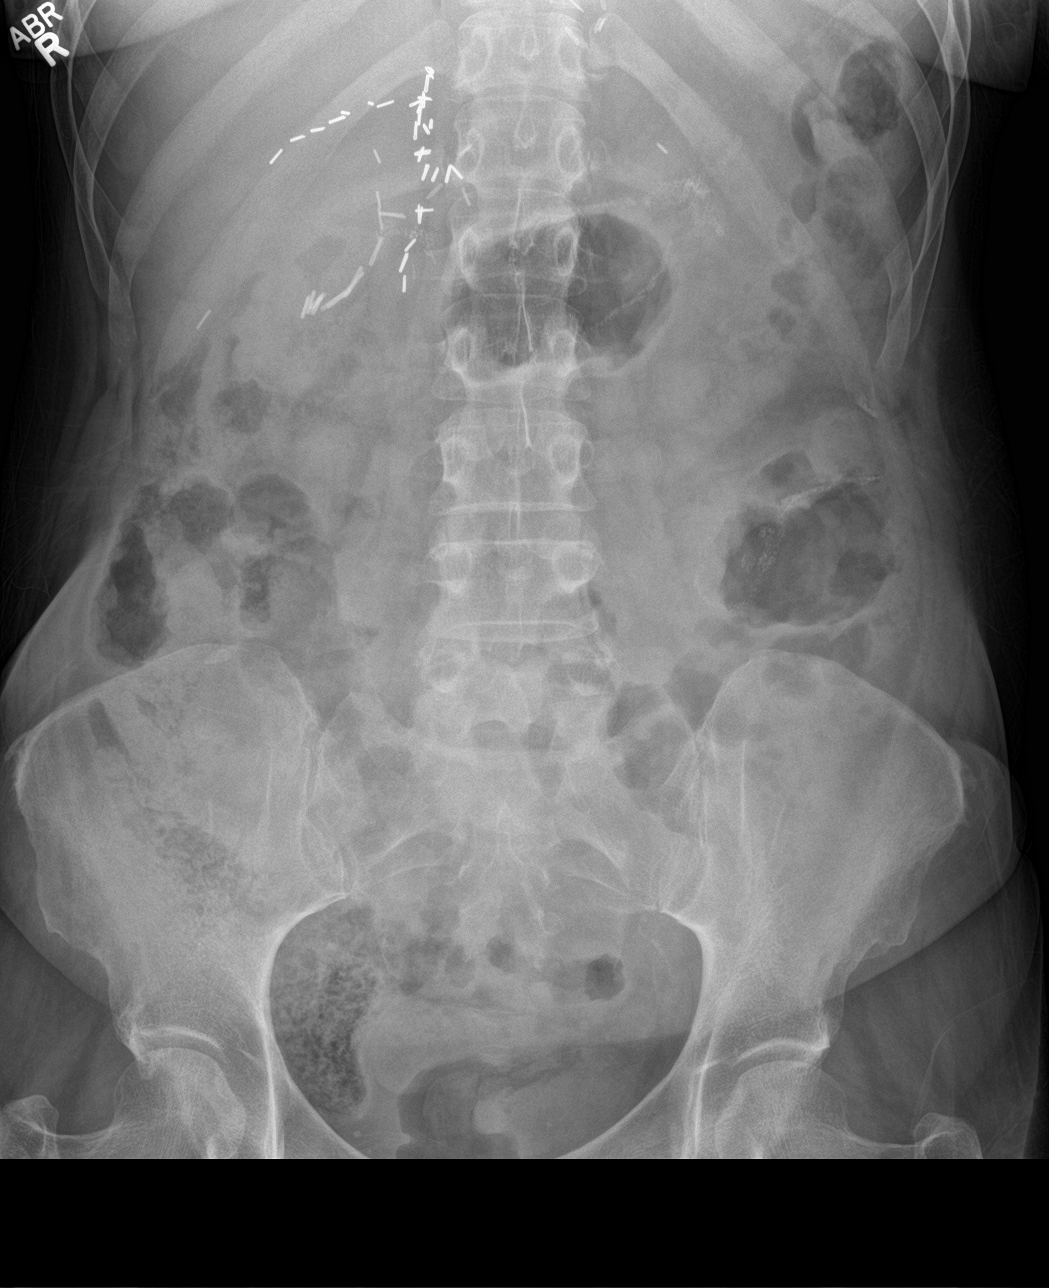

[3 of 3 positions shown; findings below may reference images not displayed]

FINDINGS: The lungs are well-aerated. Mild peribronchial thickening is noted.
There is no evidence of focal opacification, pleural effusion or
pneumothorax. The cardiomediastinal silhouette is within normal
limits.

The visualized bowel gas pattern is unremarkable. Scattered stool
and air are seen within the colon; there is no evidence of small
bowel dilatation to suggest obstruction. No free intra-abdominal air
is identified on the provided upright view. Postoperative change is
noted about the right upper quadrant. A bowel suture line is noted
at the left mid abdomen.

No acute osseous abnormalities are seen; the sacroiliac joints are
unremarkable in appearance.
IMPRESSION: 1. Unremarkable bowel gas pattern; no free intra-abdominal air seen.
Moderate amount of stool noted in the colon.
2. Mild peribronchial thickening noted; lungs otherwise clear.

## 2015-11-30 ENCOUNTER — Ambulatory Visit: Payer: Self-pay | Admitting: Gastroenterology

## 2015-11-30 ENCOUNTER — Encounter: Payer: Self-pay | Admitting: Gastroenterology

## 2015-11-30 ENCOUNTER — Telehealth: Payer: Self-pay | Admitting: Gastroenterology

## 2015-11-30 NOTE — Telephone Encounter (Signed)
PT WAS A NO SHOW AND LETTER SENT  °

## 2016-01-02 ENCOUNTER — Emergency Department (HOSPITAL_COMMUNITY)
Admission: EM | Admit: 2016-01-02 | Discharge: 2016-01-02 | Disposition: A | Discharge status: Home / Self Care | Payer: Medicare HMO | Attending: Emergency Medicine | Admitting: Emergency Medicine

## 2016-01-02 ENCOUNTER — Encounter (HOSPITAL_COMMUNITY): Payer: Self-pay | Admitting: Emergency Medicine

## 2016-01-02 DIAGNOSIS — F17210 Nicotine dependence, cigarettes, uncomplicated: Secondary | ICD-10-CM | POA: Diagnosis not present

## 2016-01-02 DIAGNOSIS — L03311 Cellulitis of abdominal wall: Secondary | ICD-10-CM | POA: Diagnosis not present

## 2016-01-02 DIAGNOSIS — Z79899 Other long term (current) drug therapy: Secondary | ICD-10-CM | POA: Insufficient documentation

## 2016-01-02 DIAGNOSIS — R109 Unspecified abdominal pain: Secondary | ICD-10-CM | POA: Diagnosis present

## 2016-01-02 DIAGNOSIS — R69 Illness, unspecified: Secondary | ICD-10-CM | POA: Diagnosis not present

## 2016-01-02 DIAGNOSIS — E039 Hypothyroidism, unspecified: Secondary | ICD-10-CM | POA: Insufficient documentation

## 2016-01-02 DIAGNOSIS — I10 Essential (primary) hypertension: Secondary | ICD-10-CM | POA: Insufficient documentation

## 2016-01-02 MED ORDER — ONDANSETRON 4 MG PO TBDP
4.0000 mg | ORAL_TABLET | Freq: Once | ORAL | Status: AC
  Administered 2016-01-02: 4 mg via ORAL
  Filled 2016-01-02: qty 1

## 2016-01-02 MED ORDER — DOXYCYCLINE HYCLATE 100 MG PO CAPS: 100.0000 mg | ORAL_CAPSULE | Freq: Two times a day (BID) | ORAL | 0 refills | Status: DC

## 2016-01-02 MED ORDER — DOXYCYCLINE HYCLATE 100 MG PO TABS
100.0000 mg | ORAL_TABLET | Freq: Once | ORAL | Status: AC
  Administered 2016-01-02: 100 mg via ORAL
  Filled 2016-01-02: qty 1

## 2016-01-02 NOTE — ED Provider Notes (Signed)
[facility] DEPT Provider Note   CSN: [id number] Arrival date & time: 01/02/16  1434     History   Chief Complaint Chief Complaint  Patient presents with  . Abdominal Pain    HPI Angel Price is a 50 y.o. female.  Patient has had a chronic draining wound in her abdomen for an extensive length of time secondary to complications from Billroth II surgery. She was initially evaluated by Dr. Tanna Furry, but has been referred [facility]. Approximately 1 week ago, the wound drainage became malodorous. No fever, sweats, chills. She is eating and doing her normal activities. Severity is mild.      Past Medical History:  Diagnosis Date  . Abscess    soft tissue  . Adrenal mass ([place])   . Alcohol abuse   . Anxiety   . Blood transfusion without reported diagnosis   . Chronic abdominal pain   . Chronic wound infection of abdomen   . Colon polyp    colonoscopy 04/2014  . Depression   . Diverticulosis    colonoscopy 04/2014 moderat pan colonic  . Gastritis    EGD 05/2014  . Gastroparesis Nov 2015  . GERD (gastroesophageal reflux disease)   . Hemorrhoid    internal large  . Hiatal hernia   . History of Billroth II operation   . Hypertension   . Lung nodule    CT 02/2014 needs repeat 1 month  . Lung nodule < 6cm on CT 04/25/2014  . Lupus   . Nausea and vomiting    chronic, recurrent  . Schatzki's ring    patent per EGD 04/2014  . Sickle cell trait ([place])   . Suicide attempt   . 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 Absolute anemia   . Pancreatitis, acute   . Acute pancreatitis 10/01/2015  . Abnormal CT scan of lung 10/01/2015  . Alcohol intoxication ([place])   . Left-sided weakness   . Psychosomatic factor in physical condition   . Upper GI bleed   . Diverticulosis of colon with hemorrhage   . Chronic wound infection of abdomen 12/22/2014  . Thyroid disease 12/22/2014  . Hypertension  12/22/2014  . Ataxia 11/01/2014  . Hemorrhoids 04/26/2014  . Lung nodule 04/26/2014  . Diverticulosis   . Gastritis   . Hiatal hernia   . Schatzki's ring   . Acute blood loss anemia 04/25/2014  . Sinus tachycardia 04/25/2014  . Hypokalemia 04/25/2014  . Hyponatremia 04/25/2014  . Hematemesis with nausea 04/25/2014  . Lung nodule < 6cm on CT 04/25/2014  . Intractable nausea and vomiting 04/24/2014  . Gastroparesis   . Abdominal pain 04/01/2014  . Chronic abdominal pain 01/21/2014  . Gastroenteritis 12/10/2013  . Chronic abdominal wound infection 08/16/2013  . MDD (major depressive disorder), recurrent episode, severe ([place]) 06/27/2013  . Wrist laceration 06/24/2013  . Nausea with vomiting 05/07/2013  . Diarrhea 05/07/2013  . Rectal bleeding 05/07/2013  . Abnormal LFTs 05/07/2013  . Adrenal mass, left ([place]) 05/07/2013  . Abscess of abdominal wall 04/19/2013  . Abdominal wall abscess 04/18/2013  . Frequent headaches 03/30/2013  . Sleep difficulties 03/30/2013  . Essential hypertension, benign 03/30/2013  . History of cocaine abuse 03/16/2013  . History of schizoaffective disorder 03/16/2013  . Bipolar disorder ([place]) 03/16/2013  . Personality disorder 03/16/2013  . Tobacco abuse 03/16/2013  . Alcohol abuse 03/16/2013  . Palpitations 03/16/2013  .  Poor vision 03/16/2013  . History of gastric bypass 03/16/2013  . Status post hysterectomy with oophorectomy 03/16/2013  . Hypothyroid 03/16/2013  . Lupus ([place]) 03/16/2013    Past Surgical History:  Procedure Laterality Date  . ABDOMINAL HYSTERECTOMY  2013   [place]  . ABDOMINAL SURGERY    . adrenal turmor removal    . AGILE CAPSULE N/A 01/05/2015   Procedure: AGILE CAPSULE;  Surgeon: Daneil Dolin, MD;  Location: [facility];  Service: Endoscopy;  Laterality: N/A;  0700  . Billroth II procedure      [place], first 2000, 2005/2006.  Marland Kitchen BIOPSY  05/20/2013   Procedure: BIOPSIES OF ASCENDING AND SIGMOID COLON;  Surgeon:  Daneil Dolin, MD;  Location: [facility] ORS;  Service: Endoscopy;;  . BIOPSY  04/26/2014   Procedure: BIOPSIES;  Surgeon: Danie Binder, MD;  Location: [facility] ORS;  Service: Endoscopy;;  . CHOLECYSTECTOMY    . COLONOSCOPY     in [place]  . COLONOSCOPY WITH PROPOFOL N/A 05/20/2013   Dr.Rourk- inadequate prep, normal appearing rectum, grossly normal colon aside from pancolonic diverticula, normal terminal ileum bx= unremarkable colonic mucosa. Due for early interval 2016.   Marland Kitchen COLONOSCOPY WITH PROPOFOL N/A 04/26/2014   [id number] ileum/one colon polyp removed/moderate pan-colonic diverticulosis/large internal hemorrhoids  . COLONOSCOPY WITH PROPOFOL N/A 12/23/2014   Dr.Rourk- minimal internal hemorrhoids, pancolonic diverticulosis  . DEBRIDEMENT OF ABDOMINAL WALL ABSCESS N/A 02/08/2013   Procedure: DEBRIDEMENT OF ABDOMINAL WALL ABSCESS;  Surgeon: Jamesetta So, MD;  Location: [facility] OR[facility];  Service: General;  Laterality: N/A;  . ESOPHAGOGASTRODUODENOSCOPY (EGD) WITH PROPOFOL N/A 05/20/2013   Dr.Rourk- s/p prior gastric surgery with normal esophagus, residual gastric mucosa and patent efferent limb  . ESOPHAGOGASTRODUODENOSCOPY (EGD) WITH PROPOFOL N/A 02/03/2014   Dr. Gala Romney:  s/p hemigastrectomy with retained gastric contents. Residual gastric mucosa and efferent limb appeared normal otherwise. Query gastroparesis.   Marland Kitchen ESOPHAGOGASTRODUODENOSCOPY (EGD) WITH PROPOFOL N/A 04/26/2014   [id number] ring/small HH/mild non-erosive gasrtitis/normal anastomosis  . ESOPHAGOGASTRODUODENOSCOPY (EGD) WITH PROPOFOL N/A 12/23/2014   Dr.Rourk- s/p prior hemigastrctomy, active oozing from anastomotic suture site, hemostasis achieved  . tendon repar Right    wrist  . WOUND EXPLORATION Right 06/24/2013   Procedure: exploration of traumatic wound right wrist;  Surgeon: Tennis Must, MD;  Location: [facility];  Service: Orthopedics;  Laterality: Right;    OB History    Gravida Para Term Preterm AB Living   4 4 4     3    SAB TAB Ectopic  Multiple Live Births                   Home Medications    Prior to Admission medications   Medication Sig Start Date End Date Taking? Authorizing Provider  doxycycline (VIBRAMYCIN) 100 MG capsule Take 1 capsule (100 mg total) by mouth 2 (two) times daily. 01/02/16   [name], MD  fluticasone (FLONASE) 50 MCG/ACT nasal spray Place 1 spray into both nostrils daily.    Historical Provider, MD  folic acid (FOLVITE) 1 MG tablet Take 1 tablet (1 mg total) by mouth daily. 10/08/15   Rosita Fire, MD  lisinopril (PRINIVIL,ZESTRIL) 10 MG tablet Take 10 mg by mouth daily.    Historical Provider, MD  Multiple Vitamin (MULTIVITAMIN WITH MINERALS) TABS tablet Take 1 tablet by mouth daily. 10/08/15   Rosita Fire, MD  nicotine (NICODERM CQ - DOSED IN MG/24 HOURS) 21 mg/24hr patch Place 1 patch (21 mg total) onto the skin daily. 10/08/15  Rosita Fire, MD  ondansetron (ZOFRAN) 4 MG tablet Take 1 tablet (4 mg total) by mouth every 6 (six) hours. 06/23/15   Carlis Stable, NP  pantoprazole (PROTONIX) 40 MG tablet Take 1 tablet (40 mg total) by mouth 2 (two) times daily before a meal. Patient taking differently: Take 40 mg by mouth daily.  09/04/14   Daleen Bo, MD  sucralfate (CARAFATE) 1 G tablet Take 1 g by mouth 4 (four) times daily.    Historical Provider, MD  thiamine 100 MG tablet Take 1 tablet (100 mg total) by mouth daily. 10/08/15   Rosita Fire, MD  traMADol (ULTRAM) 50 MG tablet Take 1 tablet (50 mg total) by mouth 3 (three) times daily as needed. 03/13/15   Virgel Manifold, MD    Family History Family History  Problem Relation Age of Onset  . Brain cancer Son   . Schizophrenia Son   . Cancer Son     brain  . Lung cancer Father   . Cancer Father     mets  . Drug abuse Mother   . Breast cancer Maternal Aunt   . Bipolar disorder Maternal Aunt   . Drug abuse Maternal Aunt   . Colon cancer Maternal Grandmother     late 57s, early 66s  . Drug abuse Sister   . Drug abuse Brother   .  Bipolar disorder Paternal Grandfather   . Bipolar disorder Cousin   . Liver disease Neg Hx     Social History Social History  Substance Use Topics  . Smoking status: Current Every Day Smoker    Packs/day: 0.50    Years: 35.00    Types: Cigarettes  . Smokeless tobacco: Never Used  . Alcohol use 7.2 oz/week    12 Cans of beer per week     Comment: 5 12 oz beers a day     Allergies   Codeine and Morphine and related   Review of Systems Review of Systems  All other systems reviewed and are negative.    Physical Exam Updated Vital Signs BP 100/69 (BP Location: Left Arm)   Pulse 105   Temp 98.2 F (36.8 C) (Oral)   Resp 16   Ht 5\' 3"  (1.6 m)   Wt 131 lb (59.4 kg)   SpO2 100%   BMI 23.21 kg/m   Physical Exam  Constitutional: She is oriented to person, place, and time. She appears well-developed and well-nourished.  HENT:  Head: Normocephalic and atraumatic.  Eyes: Conjunctivae are normal.  Neck: Neck supple.  Cardiovascular: Normal rate and regular rhythm.   Pulmonary/Chest: Effort normal and breath sounds normal.  Abdominal:  Extensive vertical scarring in abdomen. There is a small draining wound at approximately 5 o'clock in reference to her umbilicus.  Area is minimally tender.  Musculoskeletal: Normal range of motion.  Neurological: She is alert and oriented to person, place, and time.  Skin: Skin is warm and dry.  Psychiatric: She has a normal mood and affect. Her behavior is normal.  Nursing note and vitals reviewed.    ED Treatments / Results  Labs (all labs ordered are listed, but only abnormal results are displayed) Labs Reviewed - No data to display  EKG  EKG Interpretation None       Radiology No results found.  Procedures Procedures (including critical care time)  Medications Ordered in ED Medications  doxycycline (VIBRA-TABS) tablet 100 mg (100 mg Oral Given 01/02/16 1547)  ondansetron (ZOFRAN-ODT) disintegrating tablet 4 mg (4  mg  Oral Given 01/02/16 1547)     Initial Impression / Assessment and Plan / ED Course  I have reviewed the triage vital signs and the nursing notes.  Pertinent labs & imaging results that were available during my care of the patient were reviewed by me and considered in my medical decision making (see chart for details).  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 There is a small amount of malodorous discharge. Patient does not appear toxic. Rx doxycycline. Encouraged to recheck at [facility]  Final Clinical Impressions(s) / ED Diagnoses   Final diagnoses:  Cellulitis, abdominal wall    New Prescriptions Discharge Medication List as of 01/02/2016  4:01 PM    START taking these medications   Details  doxycycline (VIBRAMYCIN) 100 MG capsule Take 1 capsule (100 mg total) by mouth 2 (two) times daily., Starting Tue 01/02/2016, Print         [name], MD 01/03/16 [phone]

## 2016-01-02 NOTE — Discharge Instructions (Signed)
Keep your abdomen very clean. Prescription for antibiotic. Follow-up at [facility].

## 2016-01-02 NOTE — ED Triage Notes (Signed)
Pt reports "odor" to her abdominal wound that started last week. Pt also c/o n/v that began last week.

## 2016-01-02 NOTE — ED Notes (Signed)
Pt went outside after being medicated.  Returned and signed for d/c papers.  Did not want repeat vitals or dressing placed on abd wound.

## 2016-02-15 ENCOUNTER — Telehealth: Payer: Self-pay | Admitting: Gastroenterology

## 2016-02-15 ENCOUNTER — Ambulatory Visit: Payer: Self-pay | Admitting: Gastroenterology

## 2016-02-15 ENCOUNTER — Encounter: Payer: Self-pay | Admitting: Gastroenterology

## 2016-02-15 NOTE — Telephone Encounter (Signed)
PATIENT WAS A NO SHOW AND LETTER SENT  °

## 2016-02-20 ENCOUNTER — Telehealth: Payer: Self-pay | Admitting: Internal Medicine

## 2016-02-20 DIAGNOSIS — R110 Nausea: Secondary | ICD-10-CM

## 2016-02-20 MED ORDER — ONDANSETRON HCL 4 MG PO TABS: 4.0000 mg | ORAL_TABLET | Freq: Four times a day (QID) | ORAL | 0 refills | Status: DC

## 2016-02-20 NOTE — Telephone Encounter (Signed)
Routing to the refill box. 

## 2016-02-20 NOTE — Telephone Encounter (Signed)
Please let patient know that I have refilled her zofran. She needs to reschedule OV, she no showed last two. No further refills for zofran until seen.

## 2016-02-20 NOTE — Addendum Note (Signed)
Addended by: Mahala Menghini on: 02/20/2016 10:00 PM   Modules accepted: Orders

## 2016-02-20 NOTE — Telephone Encounter (Signed)
Pt called to see if we could call in a zofran prescription to [facility].

## 2016-02-21 ENCOUNTER — Encounter: Payer: Self-pay | Admitting: Internal Medicine

## 2016-02-21 NOTE — Telephone Encounter (Signed)
Please schedule ov.  

## 2016-02-21 NOTE — Telephone Encounter (Signed)
APPT MADE AND LETTER SENT  °

## 2016-03-07 ENCOUNTER — Ambulatory Visit: Payer: Medicare HMO | Admitting: Gastroenterology

## 2016-03-08 IMAGING — MR MR THORACIC SPINE WO/W CM
4 of 9 series · 17 of 48 positions shown · IV contrast (multihance)
Comparison: None.

CLINICAL DATA: Bilateral weakness. Multiple falls. Confusion.
Initial encounter.

EXAM:
MRI THORACIC SPINE WITHOUT AND WITH CONTRAST
TECHNIQUE: Multiplanar and multiecho pulse sequences of the thoracic spine were
obtained without and with intravenous contrast.
CONTRAST:  12 mL MULTIHANCE GADOBENATE DIMEGLUMINE 529 MG/ML IV SOLN

[Series 5: T1 · sagittal · 4.0mm · 0.86mm/px · 3 of 13 slices shown (1 of 2)]
[im 1/13]
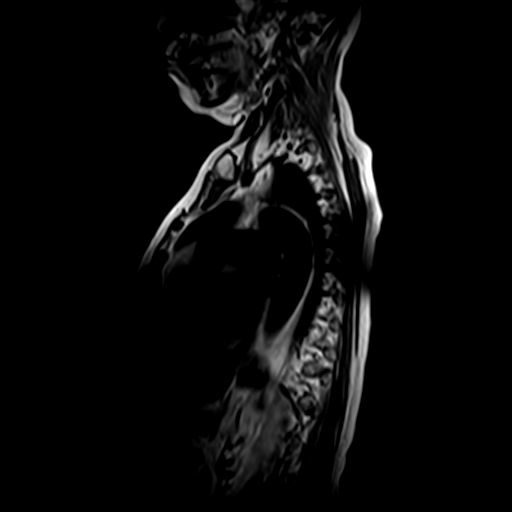
[im 9/13]
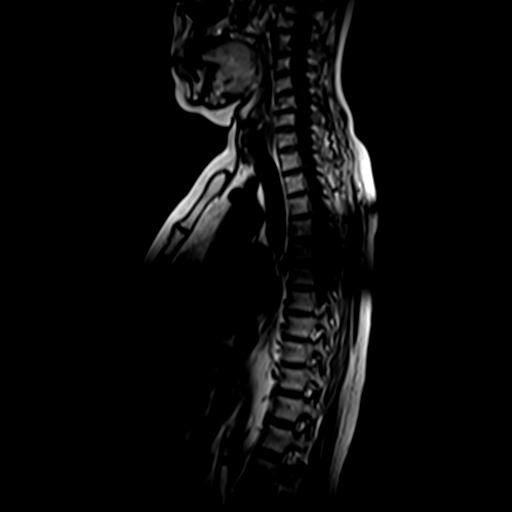
[im 13/13]
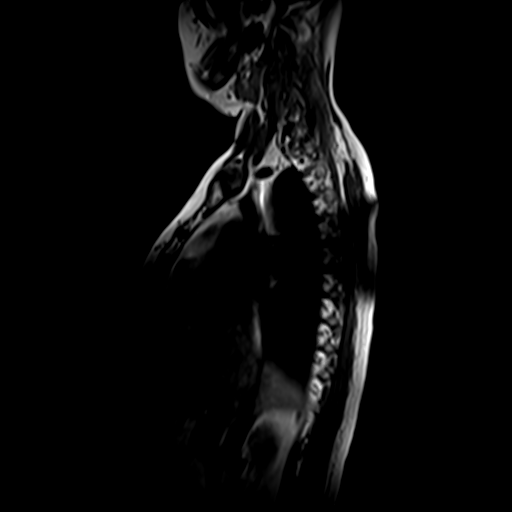

[Series 6: T2 · sagittal · 4.0mm · 0.53mm/px · 3 of 13 slices shown (1 of 2)]
[im 1/13]
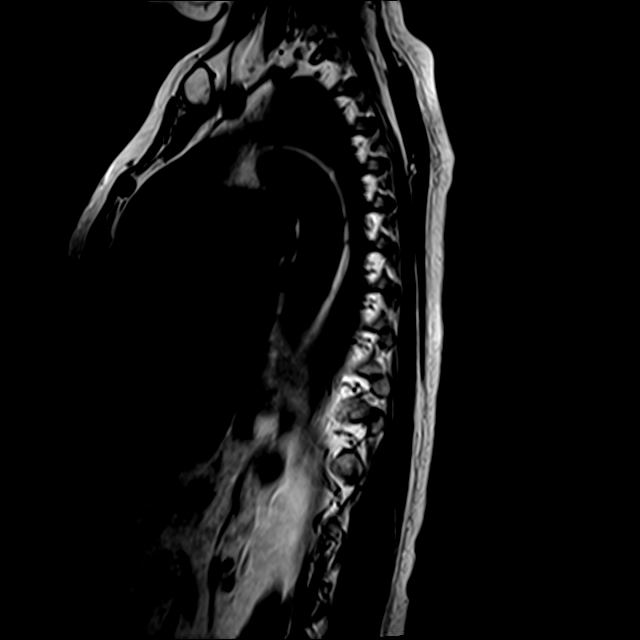
[im 7/13]
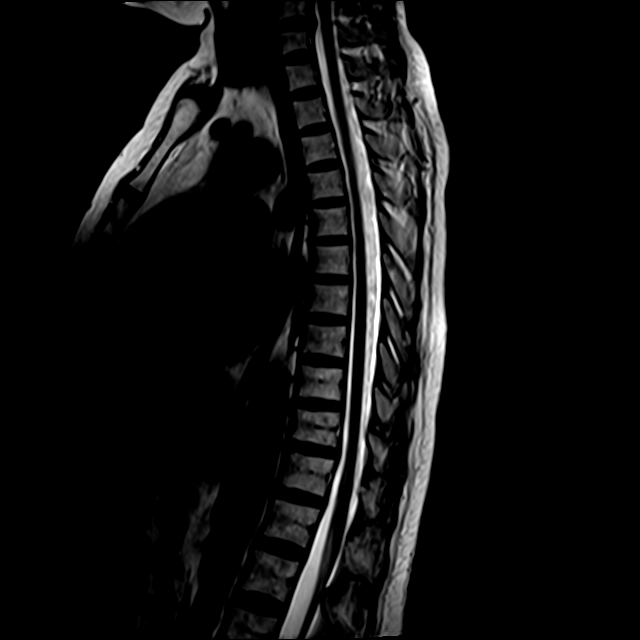
[im 13/13]
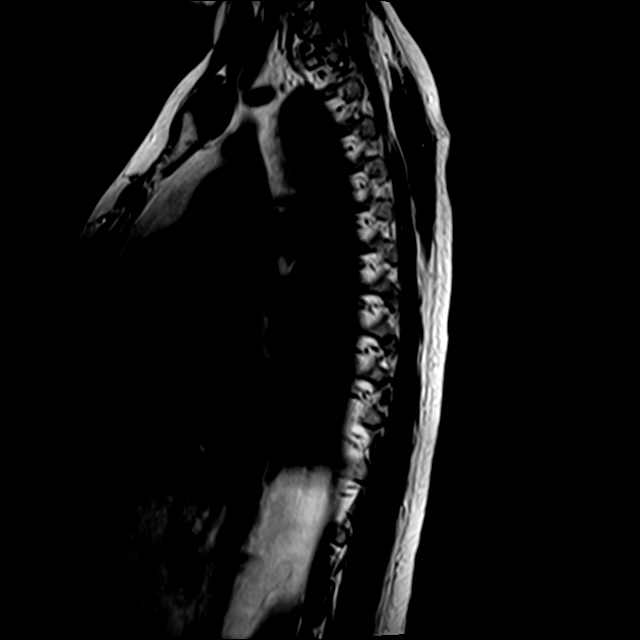

[Series 7: T1 · sagittal · 4.0mm · 0.53mm/px · 3 of 13 slices shown (2 of 2)]
[im 1/13]
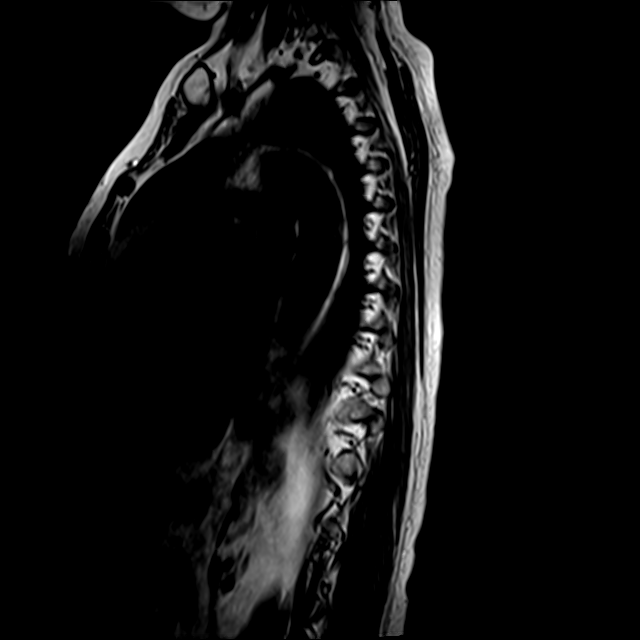
[im 7/13]
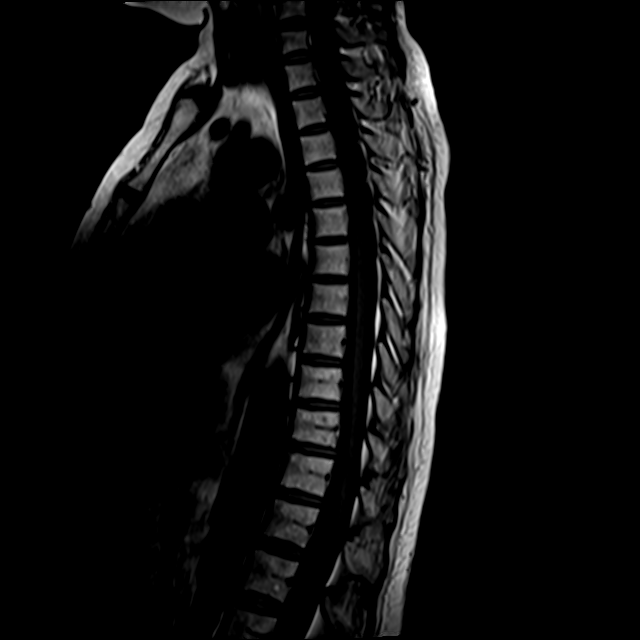
[im 13/13]
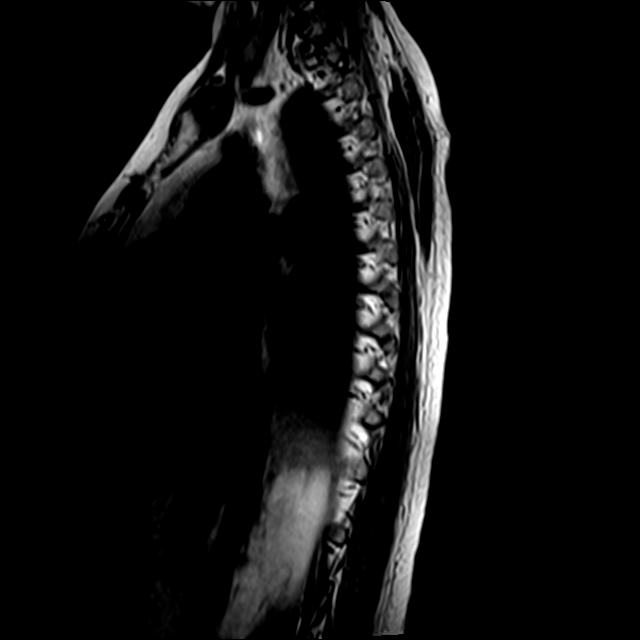

[Series 10: T2 · axial · 3.0mm · 0.25mm/px · z∈[-452,-232]mm · 8 of 38 slices shown (2 of 2)]
[im 1/38]
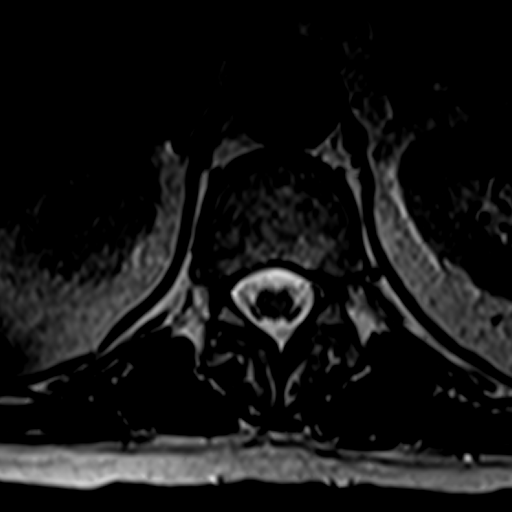
[im 6/38]
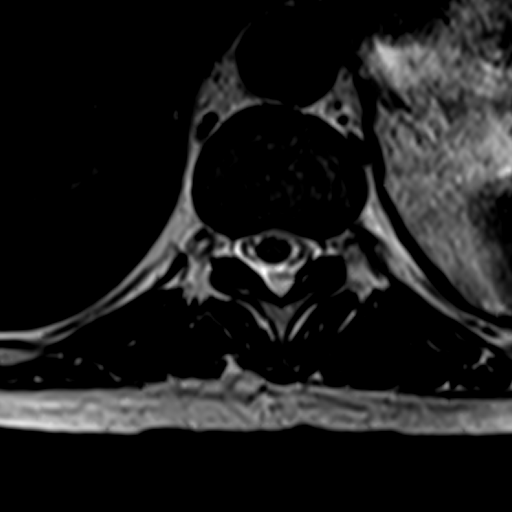
[im 11/38]
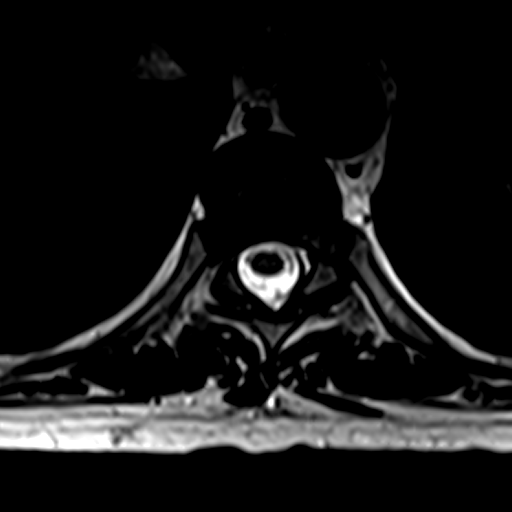
[im 16/38]
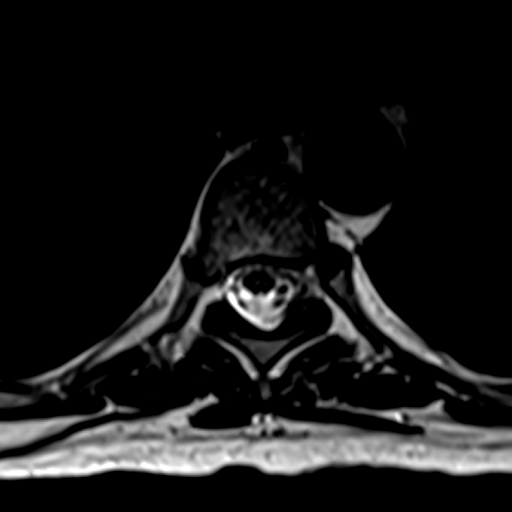
[im 22/38]
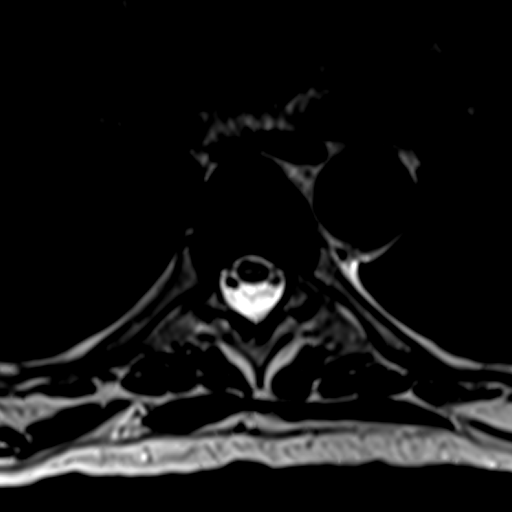
[im 27/38]
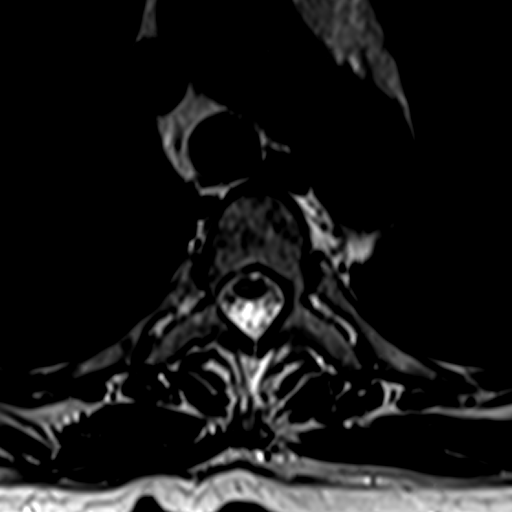
[im 32/38]
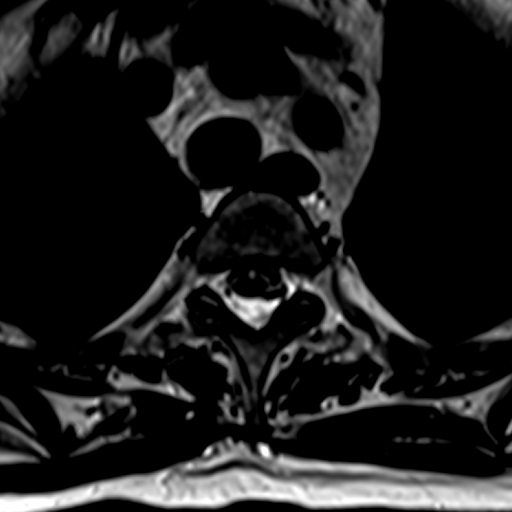
[im 38/38]
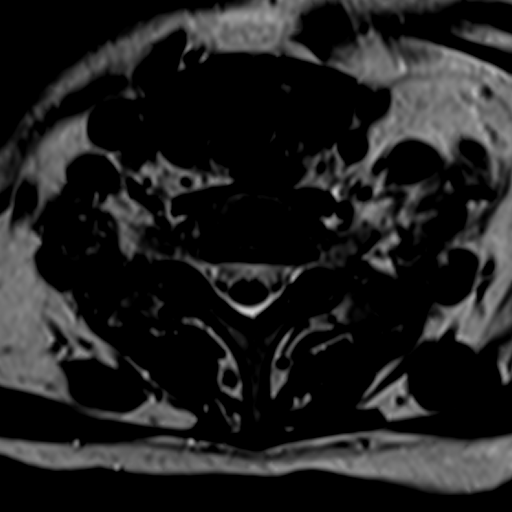

[17 of 48 positions shown; findings below may reference images not displayed]

FINDINGS: Vertebral body height, signal and alignment are maintained. The
thoracic cord demonstrates normal size, shape and signal at all
levels. The central canal and foramina are widely patent at all
levels. No pathologic enhancement after contrast administration is
identified. Imaged paraspinous structures are unremarkable.
IMPRESSION: Negative examination.  No finding to explain the patient's symptoms.

## 2016-03-08 IMAGING — MR MR CERVICAL SPINE WO/W CM
4 of 8 series · 14 of 48 positions shown · IV contrast (12ml Multihance)
Comparison: None.

CLINICAL DATA: Bilateral weakness. Multiple falls. Confusion.
Initial encounter.

EXAM:
MRI CERVICAL SPINE WITHOUT AND WITH CONTRAST
TECHNIQUE: Multiplanar and multiecho pulse sequences of the cervical spine, to
include the craniocervical junction and cervicothoracic junction,
were obtained according to standard protocol without and with
intravenous contrast.
CONTRAST:  12 mL MULTIHANCE GADOBENATE DIMEGLUMINE 529 MG/ML IV SOLN

[Series 2: T2 · sagittal · 3.0mm · 0.32mm/px · 4 of 15 slices shown (1 of 2)]
[im 1/15]
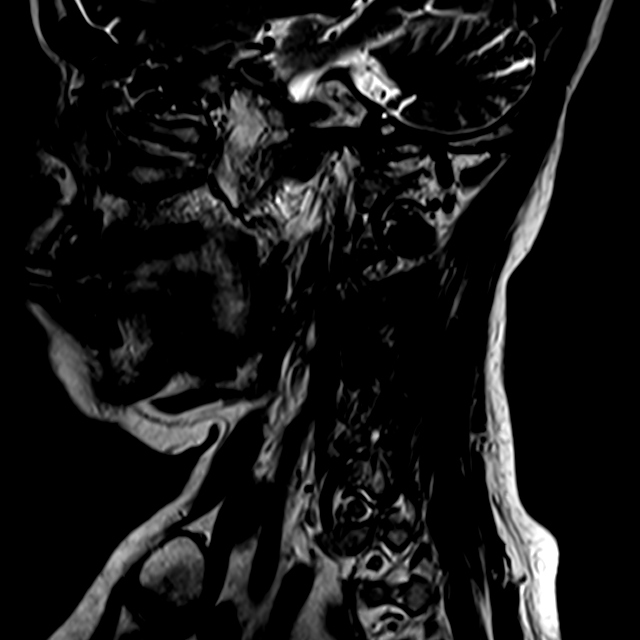
[im 5/15]
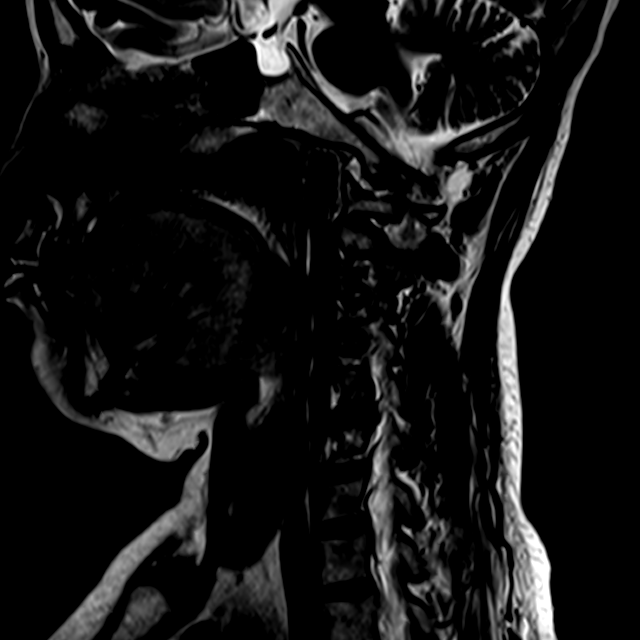
[im 10/15]
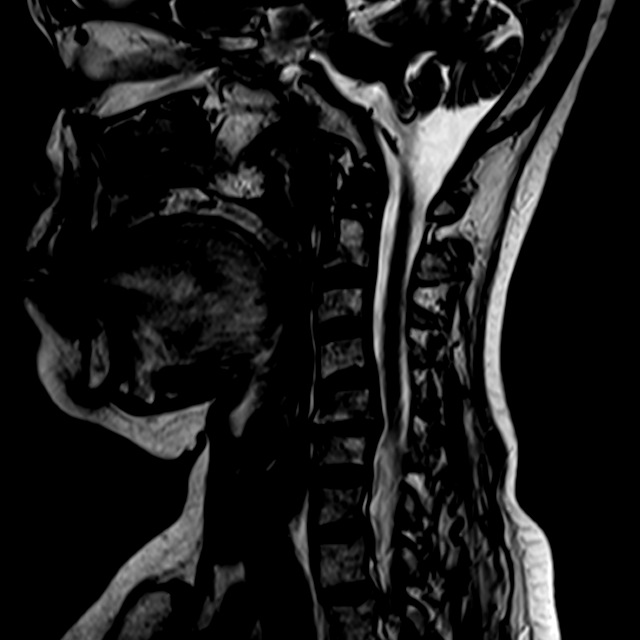
[im 15/15]
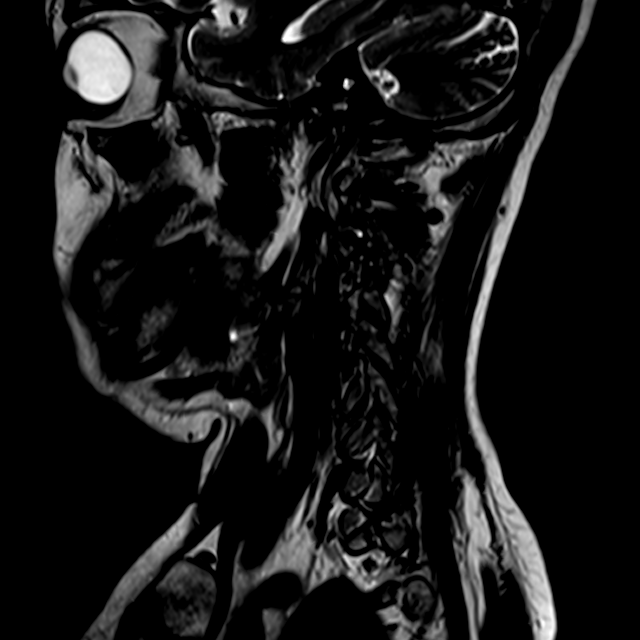

[Series 6: T2 · axial · 3.0mm · 0.29mm/px · z∈[-240,-163]mm · 4 of 30 slices shown (2 of 2)]
[im 1/30]
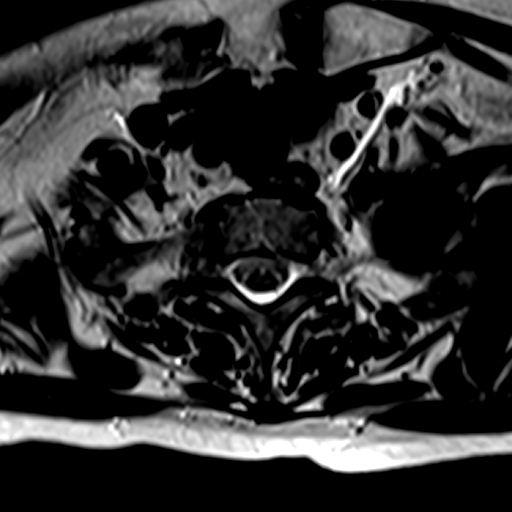
[im 5/30]
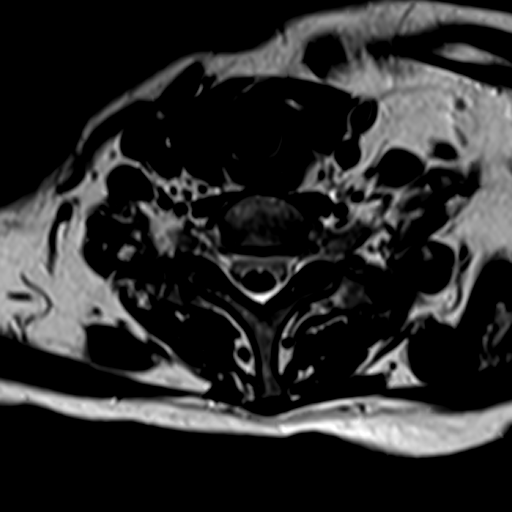
[im 17/30]
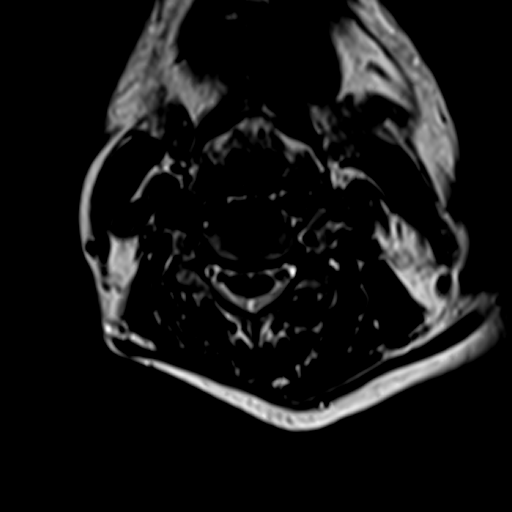
[im 25/30]
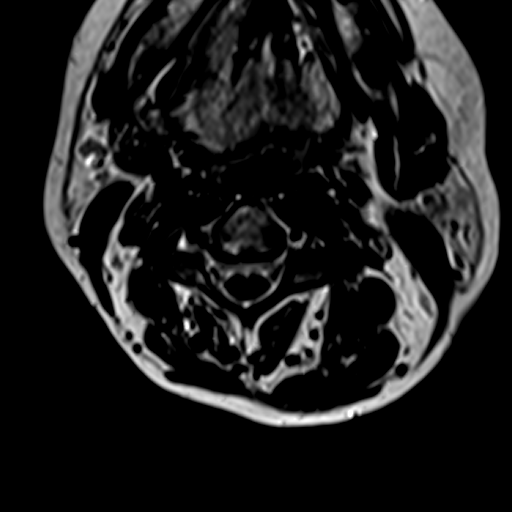

[Series 7: T1 · axial · 3.0mm · 0.18mm/px · z∈[-225,-161]mm · 3 of 30 slices shown]
[im 5/30]
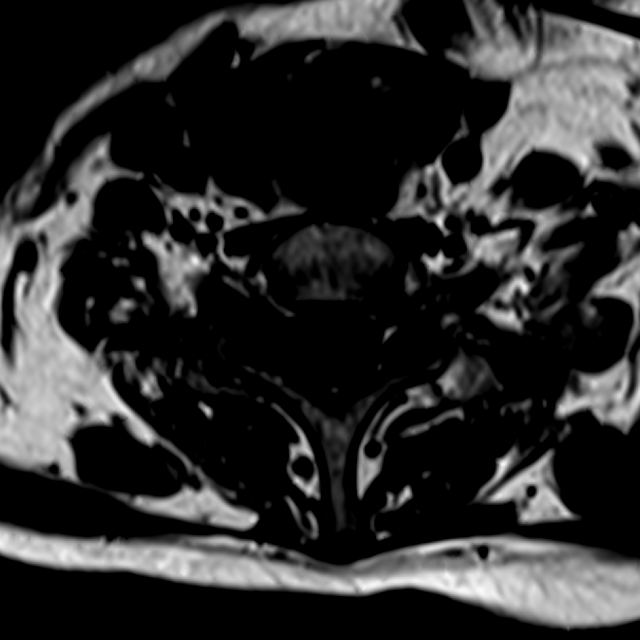
[im 17/30]
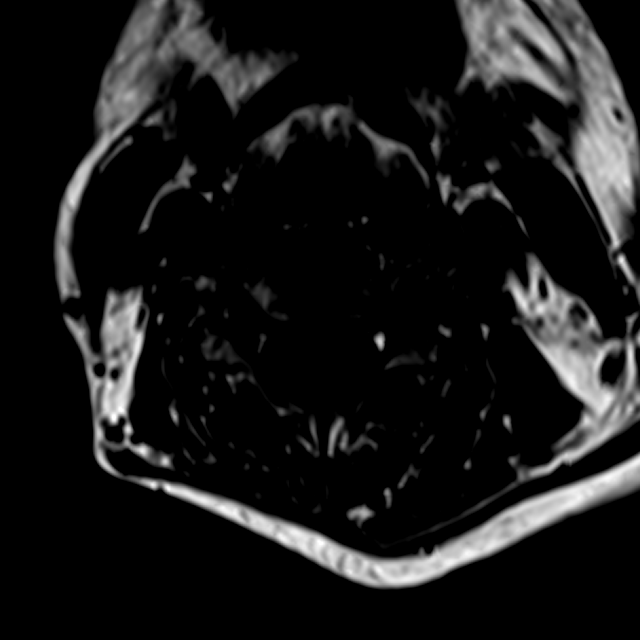
[im 25/30]
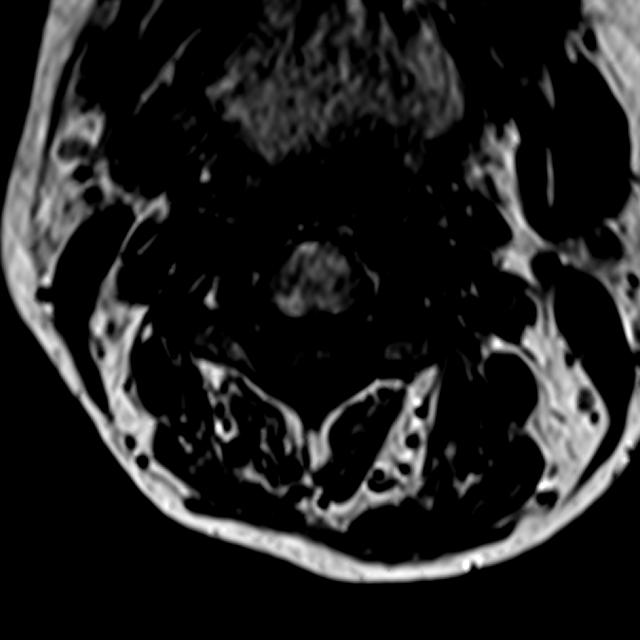

[Series 10: T1 post-contrast · axial · 3.0mm · 0.18mm/px · z∈[-228,-164]mm · 3 of 30 slices shown]
[im 5/30]
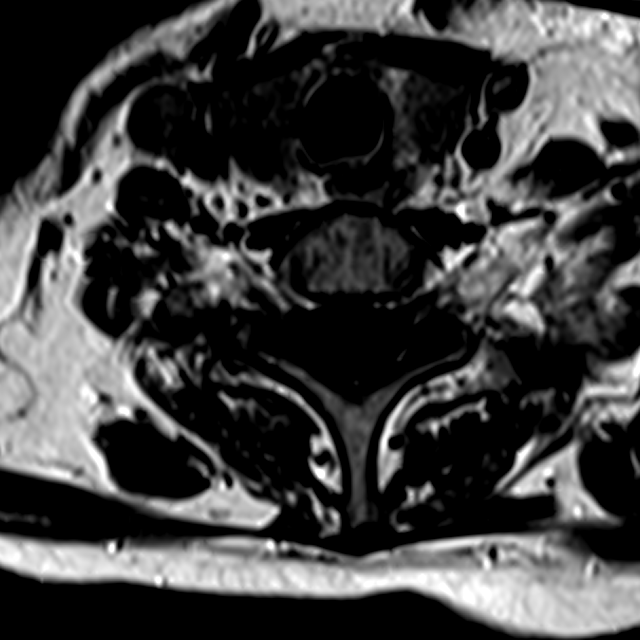
[im 17/30]
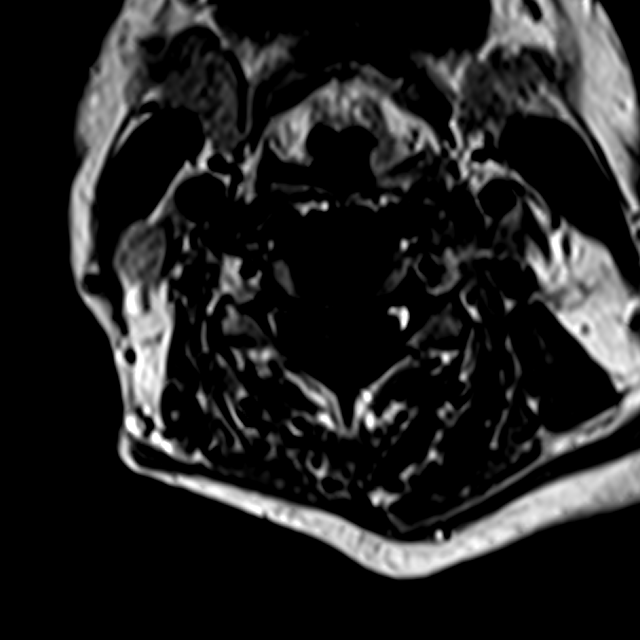
[im 25/30]
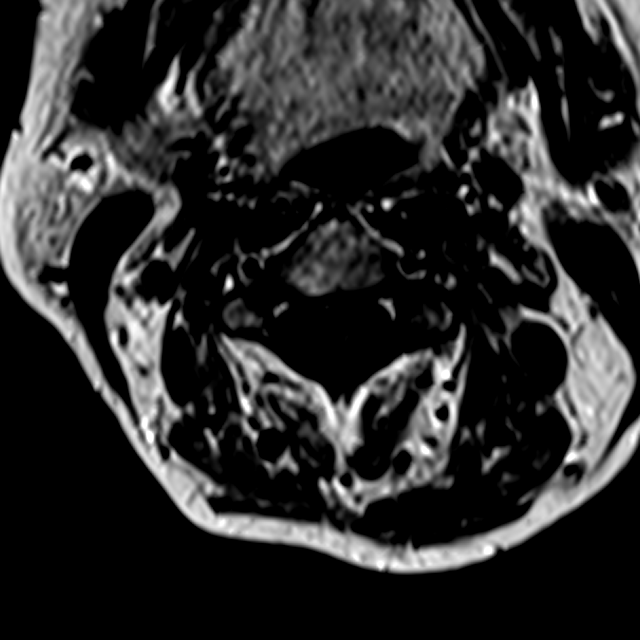

[14 of 48 positions shown; findings below may reference images not displayed]

FINDINGS: There is mild kyphosis about the C5-6 level due to loss of disc
space height anteriorly. Vertebral body height is maintained. Mild
degenerative endplate signal change is seen at C5-6. There is no
worrisome marrow lesion. The craniocervical junction is normal and
cervical cord signal is normal. Imaged paraspinous structures are
unremarkable.

C2-3: Shallow disc bulge to the right without central canal or
foraminal stenosis.

C3-4: Very shallow central protrusion without central canal or
foraminal stenosis.

C4-5: Minimal disc bulge without central canal or foraminal
stenosis.

C5-6: A disc osteophyte complex effaces the ventral thecal sac but
the central canal appears open. Mild left foraminal narrowing is
noted. The right foramen is patent.

C6-7: Very shallow left paracentral protrusion without central canal
or foraminal stenosis.

C7-T1:  Negative.
IMPRESSION: No finding to explain the patient's symptoms.

Shallow disc bulge at C5-6 effaces the ventral thecal sac causing
mild central canal narrowing. There is no cord deformity. The left
foramen is also mildly narrowed at this level.

Very shallow left paracentral protrusion C6-7 without central canal
or foraminal stenosis.

## 2016-03-08 IMAGING — MR MR HEAD WO/W CM
7 of 12 series · 27 of 48 positions shown · IV contrast (multihance)
Comparison: MRI cervical and thoracic spine reported separately.

CLINICAL DATA: Weakness and confusion with multiple falls. Symptom
duration approximately 1 month. History of urinary incontinence.

EXAM:
MRI HEAD WITHOUT AND WITH CONTRAST
TECHNIQUE: Multiplanar, multiecho pulse sequences of the brain and surrounding
structures were obtained without and with intravenous contrast.
CONTRAST:  12mL MULTIHANCE GADOBENATE DIMEGLUMINE 529 MG/ML IV SOLN

[Series 8: t1_fl2d_sag · sagittal · 5.0mm · 0.43mm/px · 1 of 20 slices shown]
[im 1/20]
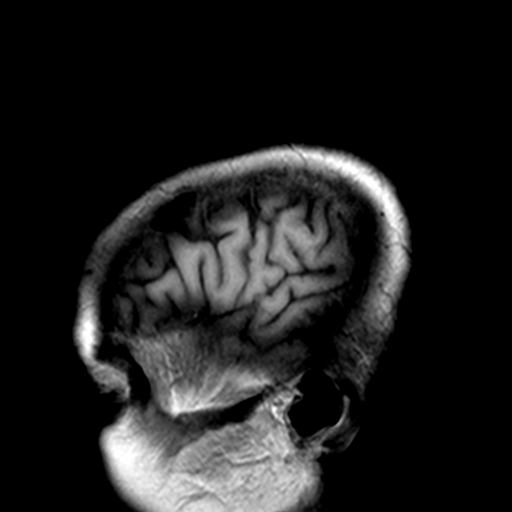

[Series 11: T2 · axial · 5.0mm · 0.75mm/px · z∈[-149,-13]mm · 2 of 23 slices shown]
[im 1/23]
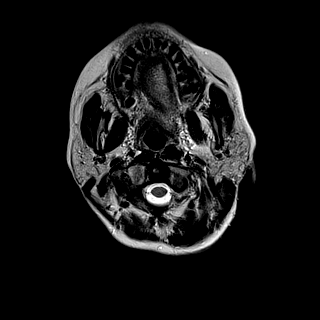
[im 23/23]
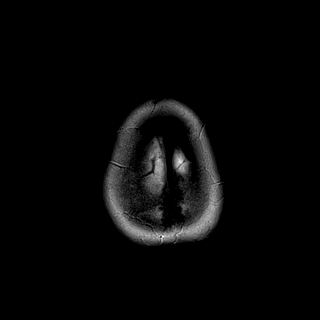

[Series 12: FLAIR · axial · 5.0mm · 0.94mm/px · z∈[-150,-14]mm · 2 of 23 slices shown]
[im 1/23]
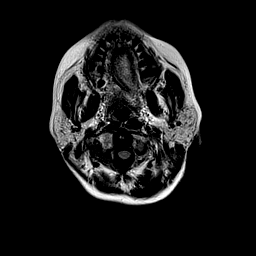
[im 23/23]
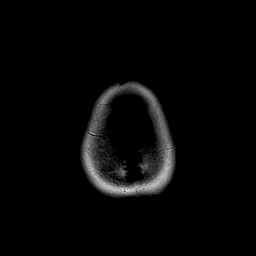

[Series 13: T1 · axial · 2.0mm · 0.47mm/px · z∈[-152,+27]mm · 8 of 95 slices shown]
[im 1/95]
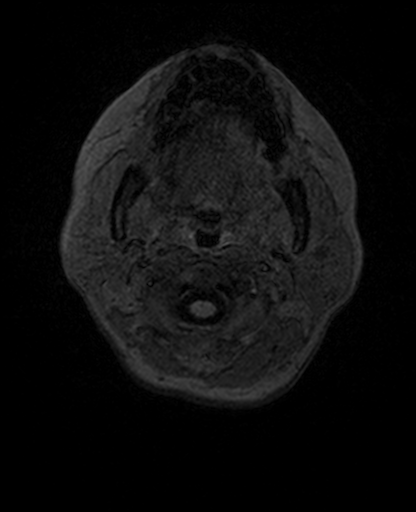
[im 12/95]
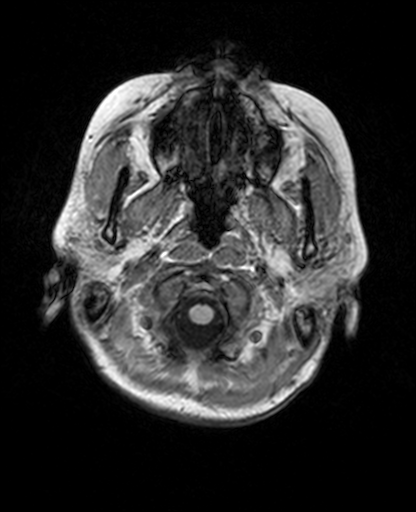
[im 24/95]
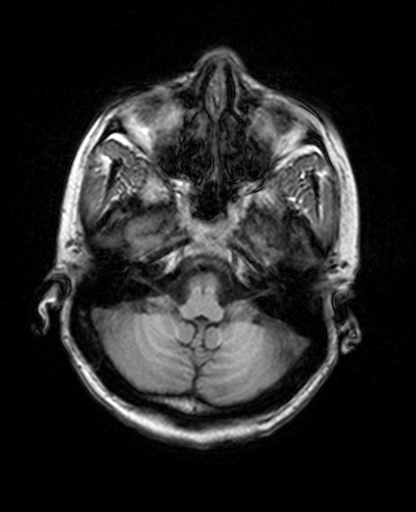
[im 36/95]
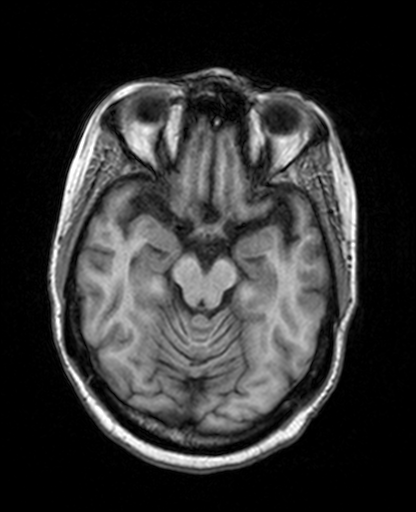
[im 59/95]
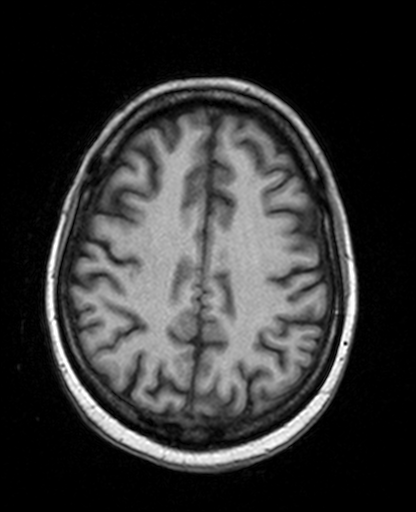
[im 71/95]
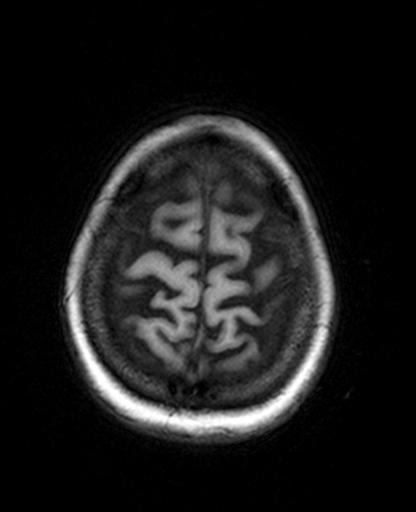
[im 83/95]
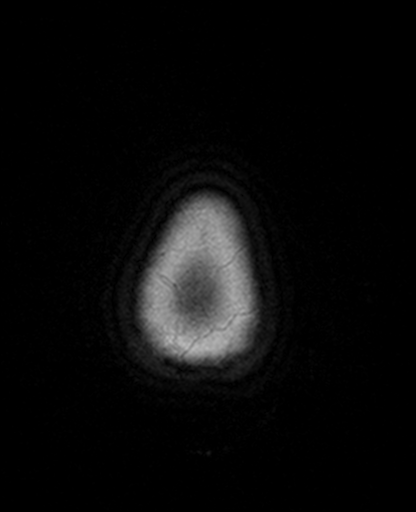
[im 95/95]
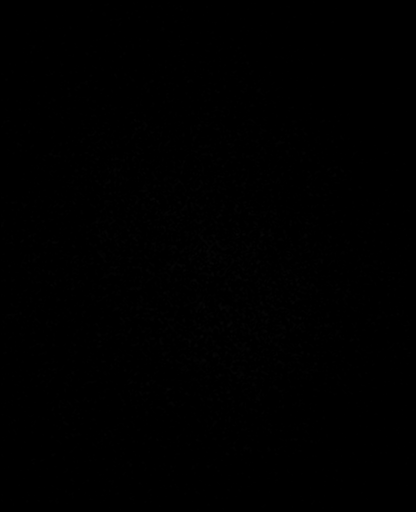

[Series 16: T1 post-contrast · axial · 2.0mm · 0.47mm/px · z∈[-163,+18]mm · 9 of 95 slices shown (1 of 2)]
[im 1/95]
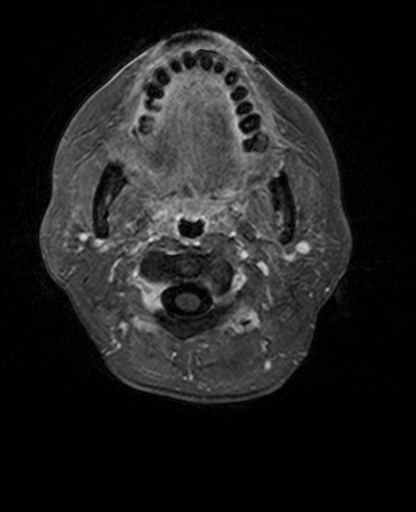
[im 12/95]
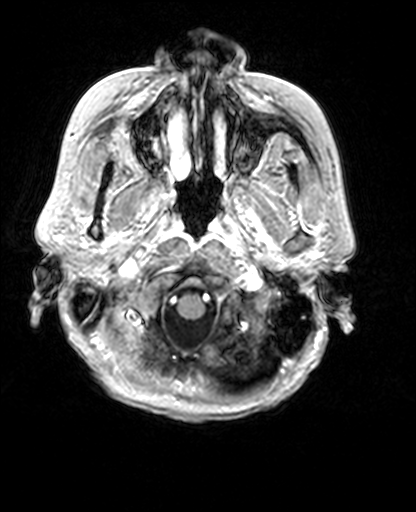
[im 24/95]
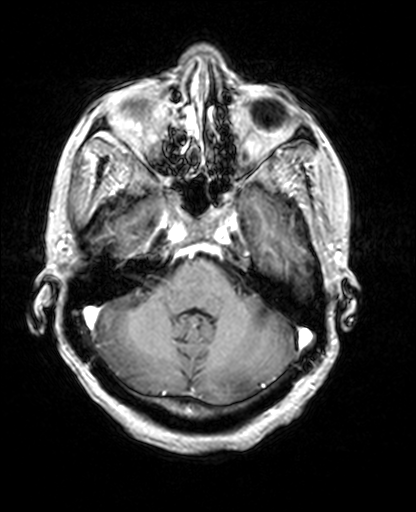
[im 36/95]
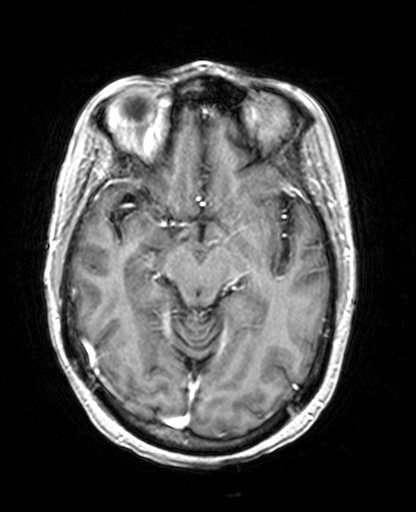
[im 48/95]
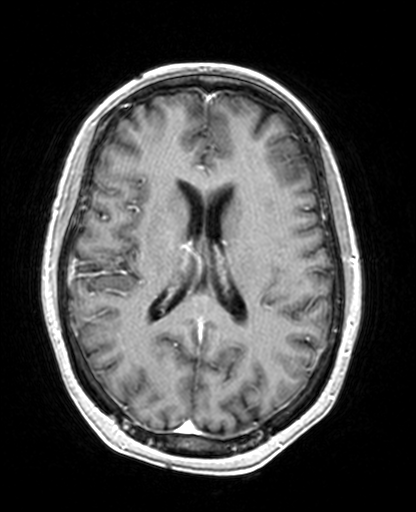
[im 59/95]
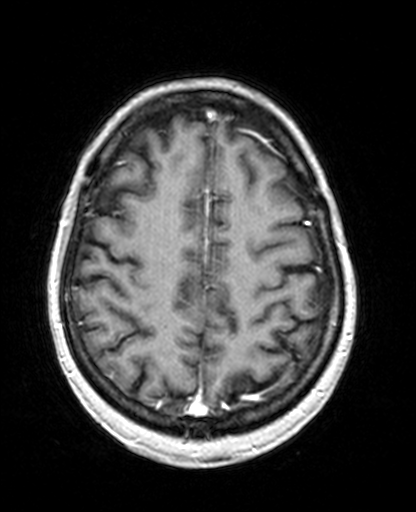
[im 71/95]
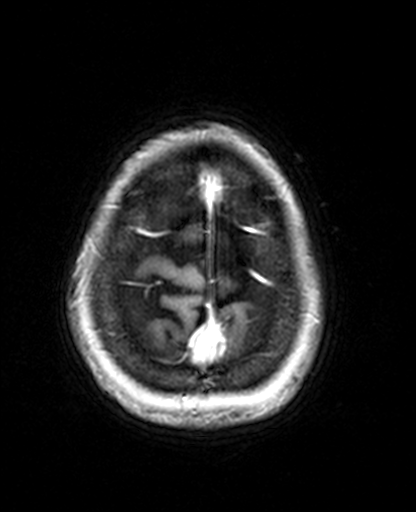
[im 83/95]
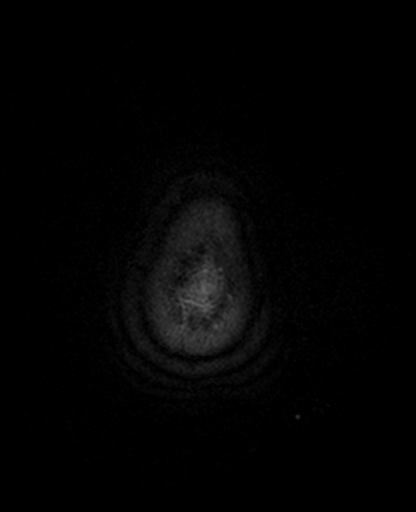
[im 95/95]
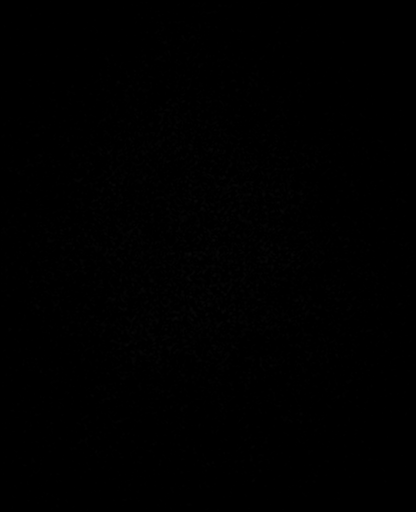

[Series 17: T2 post-contrast · coronal · 5.0mm · 0.64mm/px · 3 of 28 slices shown]
[im 1/28]
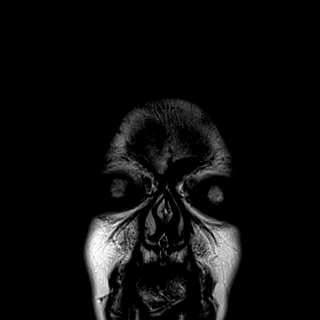
[im 14/28]
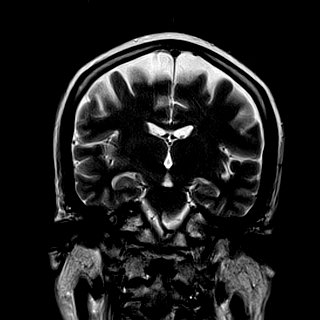
[im 28/28]
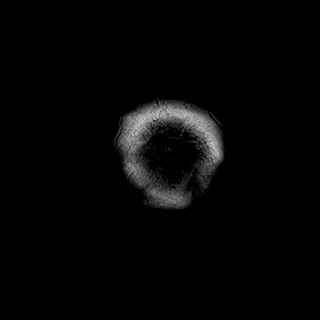

[Series 18: T1 post-contrast · coronal · 5.0mm · 0.36mm/px · 2 of 24 slices shown (2 of 2)]
[im 1/24]
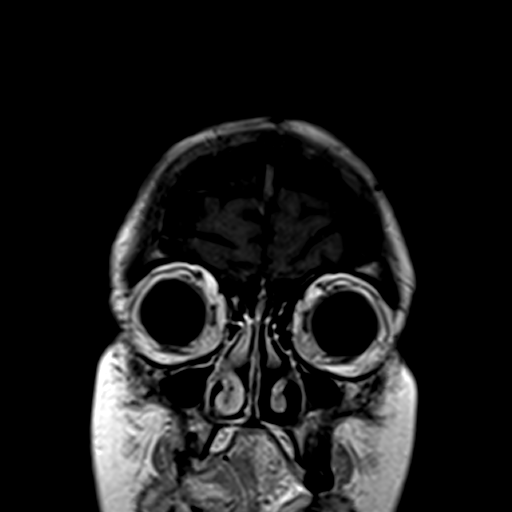
[im 24/24]
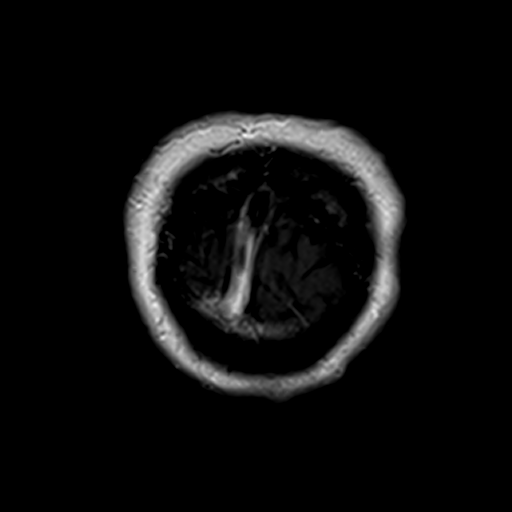

[27 of 48 positions shown; findings below may reference images not displayed]

FINDINGS: The patient was unable to remain motionless for the exam. Small or
subtle lesions could be overlooked.

No evidence for acute infarction, hemorrhage, mass lesion,
hydrocephalus, or extra-axial fluid. Mild atrophy. No appreciable
white matter disease.

Partial empty sella. Pineal and cerebellar tonsils unremarkable. No
upper cervical lesions.

Flow voids are maintained throughout the carotid, basilar, and
vertebral arteries. There are no areas of chronic hemorrhage.

Post infusion, no abnormal enhancement of the brain or meninges.

Visualized calvarium, skull base, and upper cervical osseous
structures unremarkable. Scalp and extracranial soft tissues,
orbits, sinuses, and mastoids show no acute process.
IMPRESSION: Motion degraded examination demonstrating no acute intracranial
findings. Mild atrophy is suggested which may be premature for the
patient's age.

Partial empty sella, nonspecific.

## 2016-03-18 IMAGING — DX DG CHEST 2V
2 series · 2 of 2 positions shown · non-contrast
Comparison: 03/14/2014

CLINICAL DATA: facial swelling increasing, pt states tongue feels
tingling, left elbow pain, bilateral leg swelling, HA for x 2 days,
nausea/vomiting x 2 days

EXAM:
CHEST  2 VIEW

[chest pa]
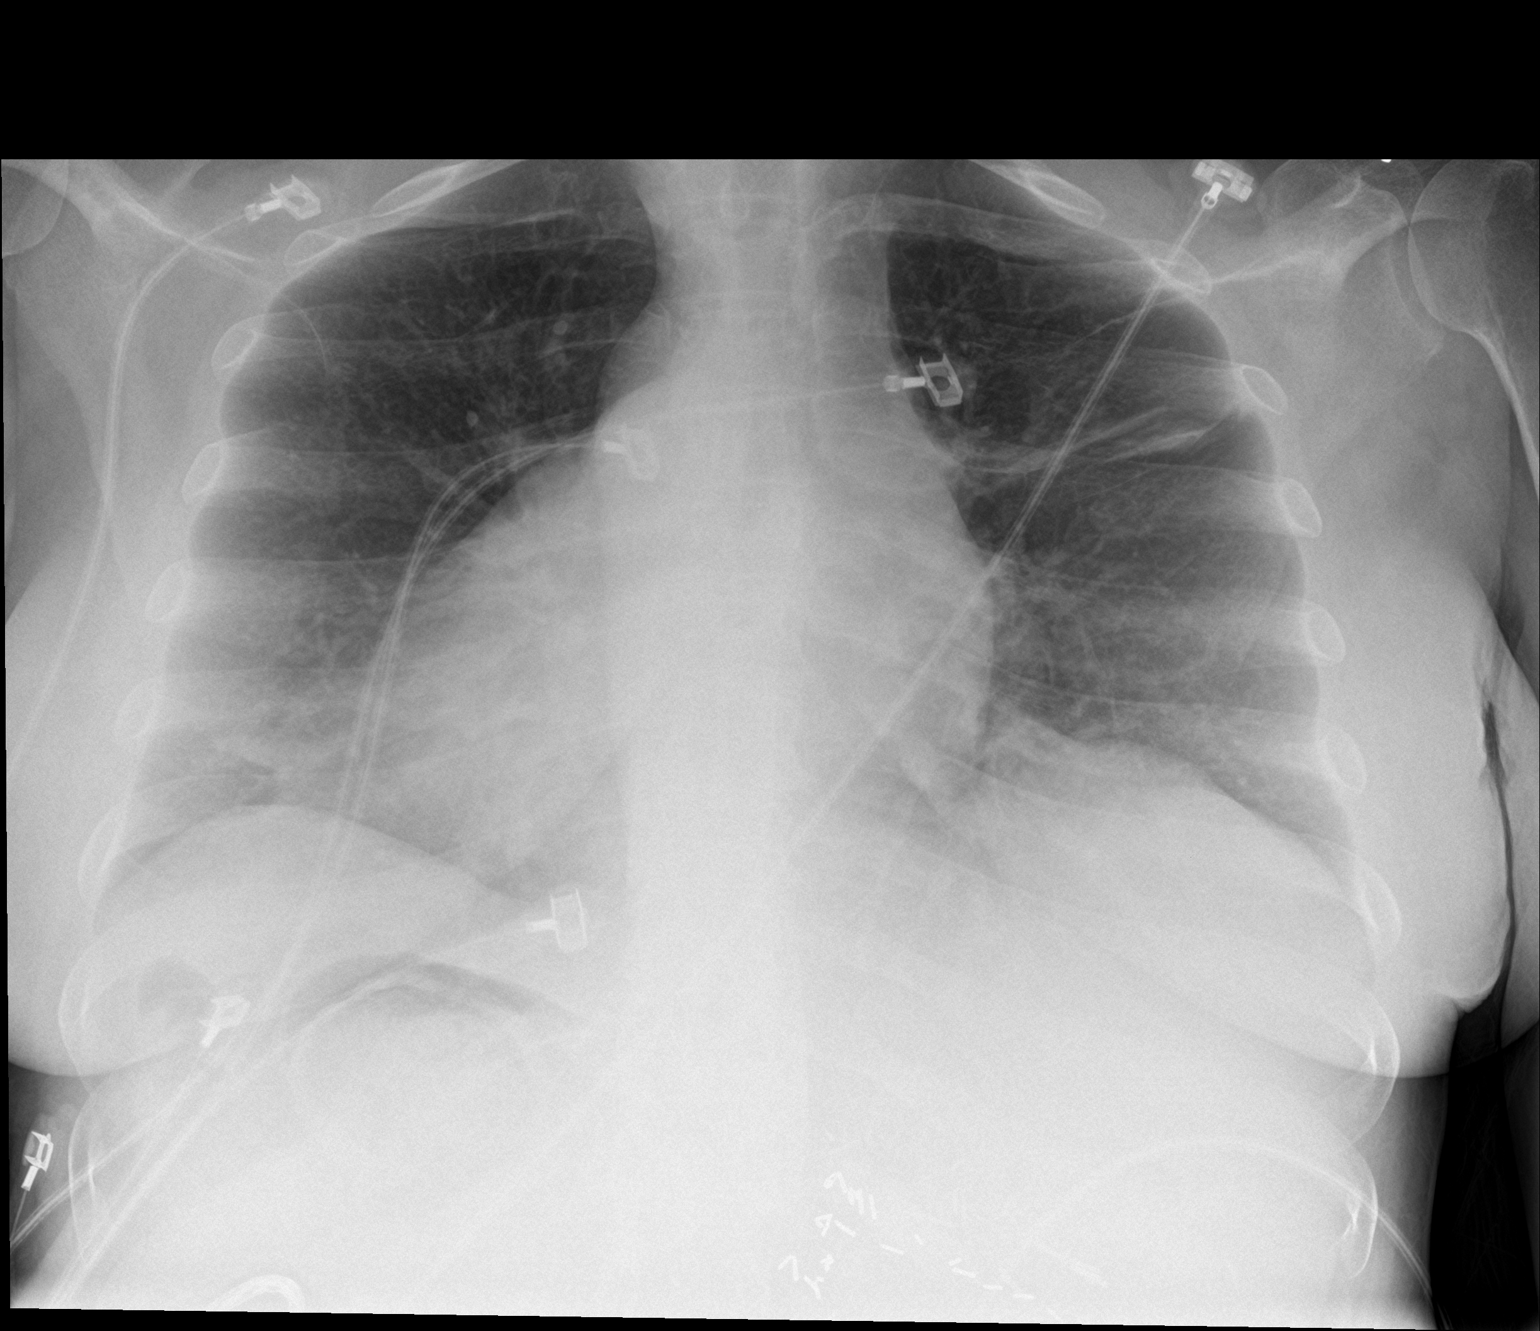

[chest lat]
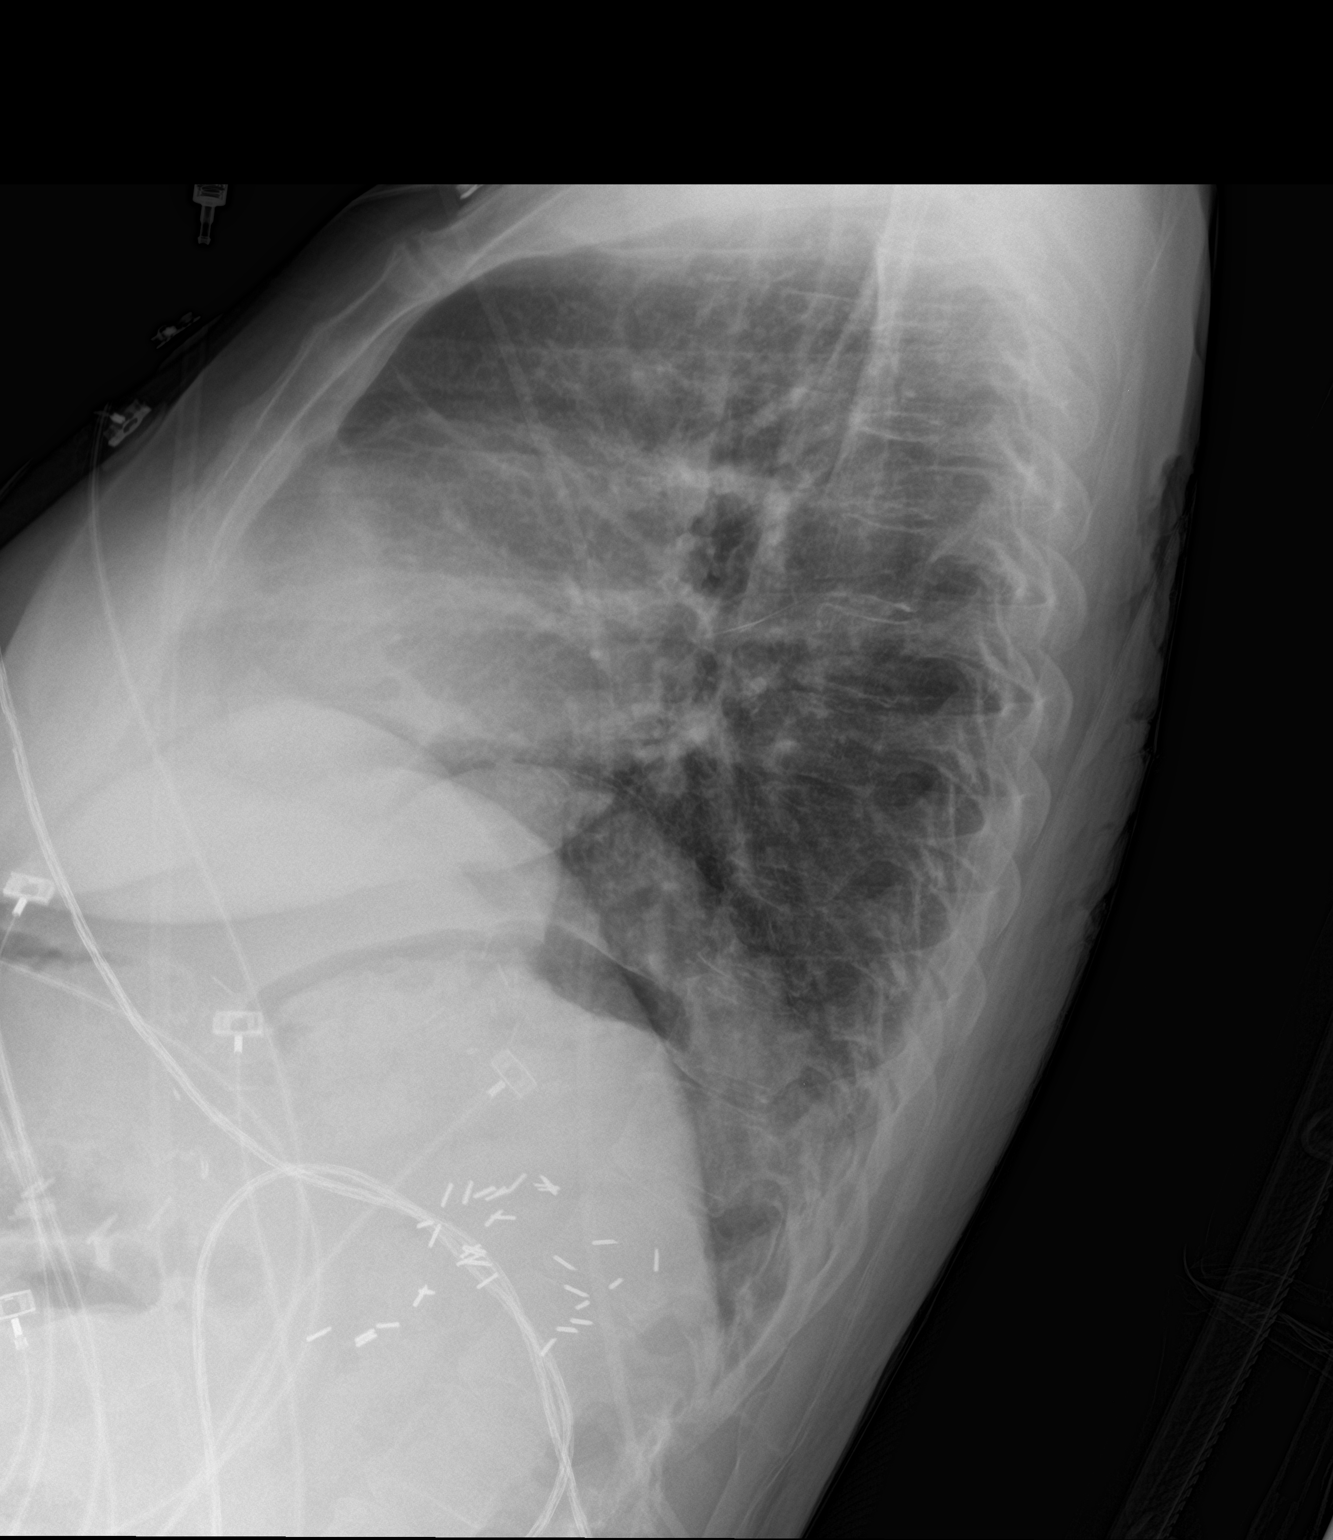

[2 of 2 positions shown; findings below may reference images not displayed]

FINDINGS: Lung volumes are relatively low. There is linear opacity in the
right upper lobe most consistent with atelectasis. No lung
consolidation or edema. No pleural effusion or pneumothorax.

Cardiac silhouette is normal in size and configuration. No
mediastinal or hilar masses or convincing adenopathy. The bony
thorax is unremarkable.
IMPRESSION: No acute cardiopulmonary disease.

## 2016-03-18 IMAGING — CT CT MAXILLOFACIAL W/O CM
3 of 5 series · 15 of 47 positions shown, 18 images · non-contrast
Comparison: 06/22/2006

CLINICAL DATA: Complaining of headache for 2 days. Facial swelling
and tongue tingling. Nausea and vomiting. History of hypertension
and lupus.

EXAM:
CT HEAD WITHOUT CONTRAST
CT MAXILLOFACIAL WITHOUT CONTRAST
TECHNIQUE: Multidetector CT imaging of the head and cervical spine was
performed following the standard protocol without intravenous
contrast. Multiplanar CT image reconstructions of the cervical spine
were also generated.

[Series 4: max st 2.0 h31s · axial · 0.31mm/px · z∈[+20,+152]mm · 9 of 80 slices shown, 12 images]
[im 7/80  brain]
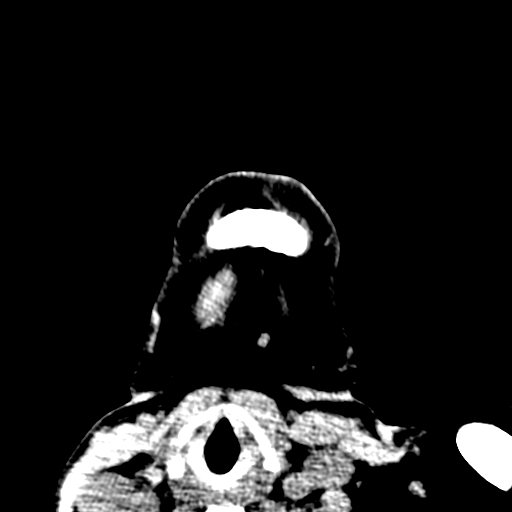
[im 7/80  bone]
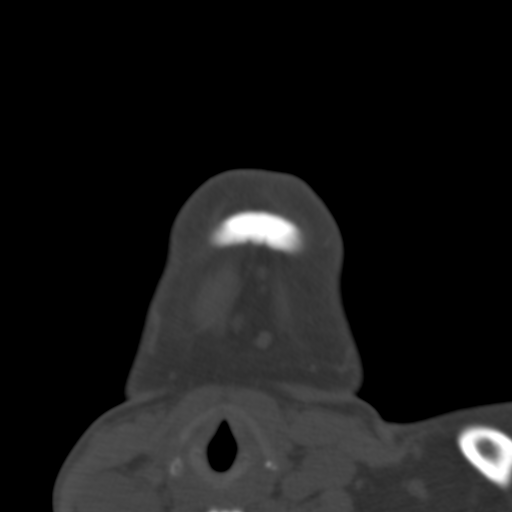
[im 19/80  bone]
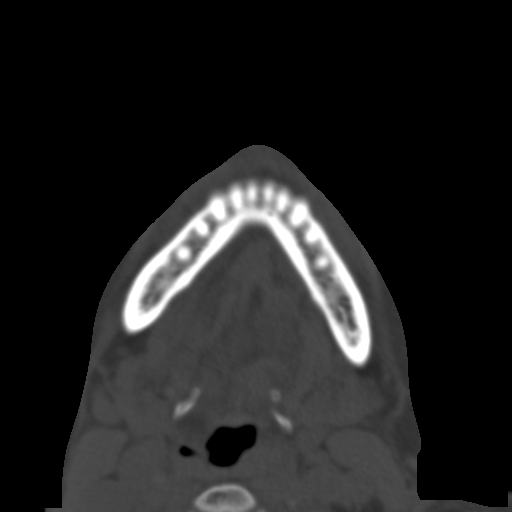
[im 25/80  bone]
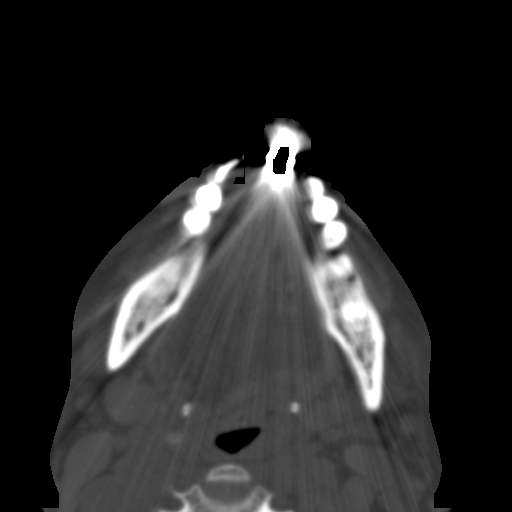
[im 31/80  bone]
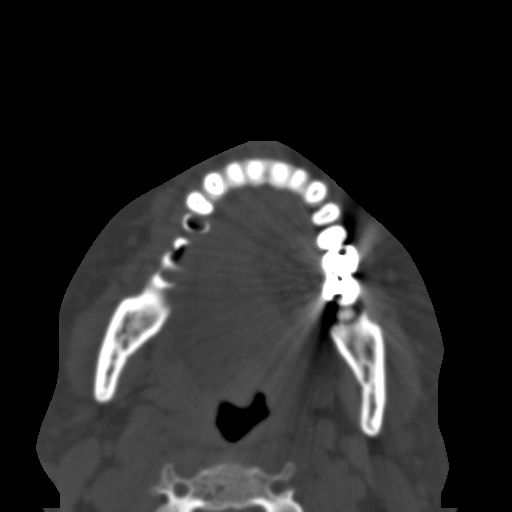
[im 43/80  brain]
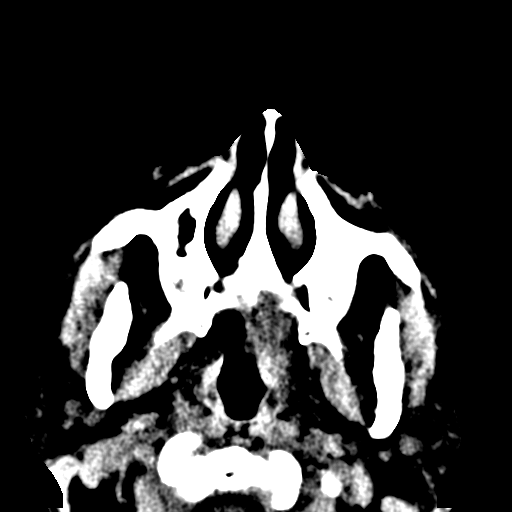
[im 43/80  bone]
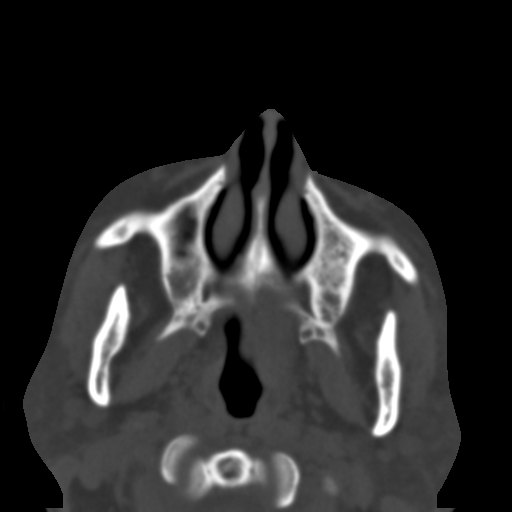
[im 49/80  bone]
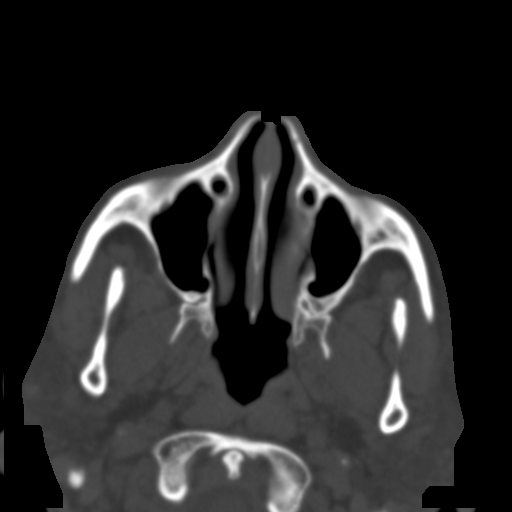
[im 55/80  bone]
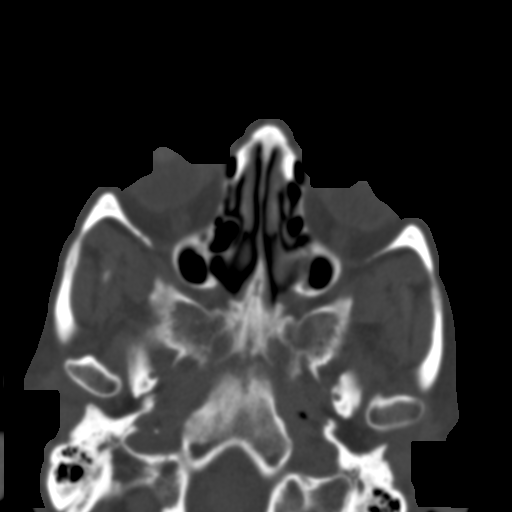
[im 67/80  bone]
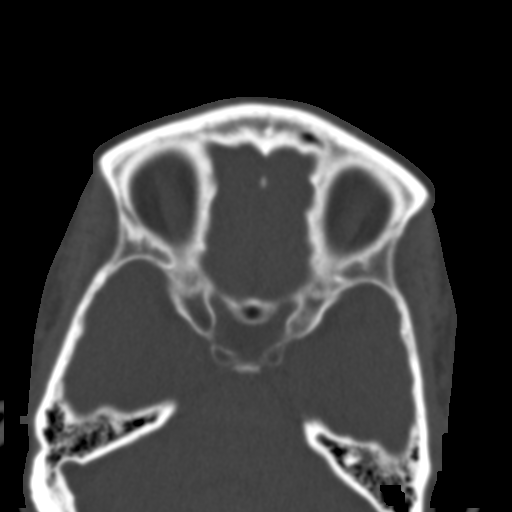
[im 73/80  brain]
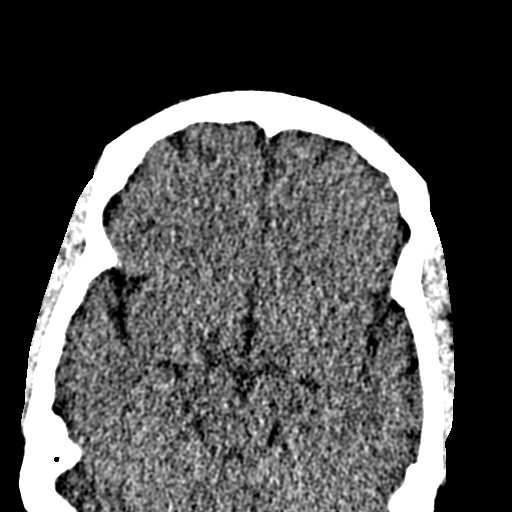
[im 73/80  bone]
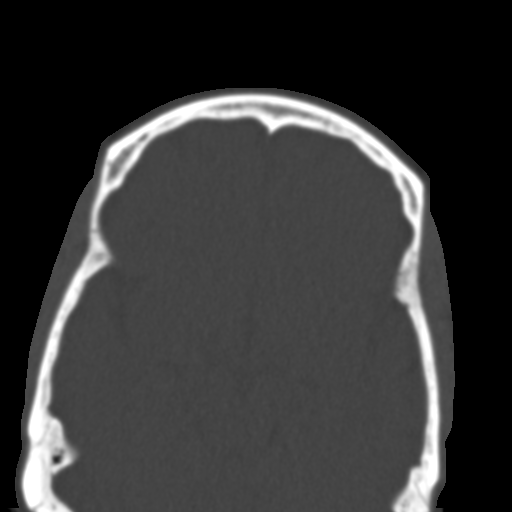

[Series 6: max st coronal · coronal · 0.32mm/px · 3 of 60 slices shown]
[im 20/60  bone]
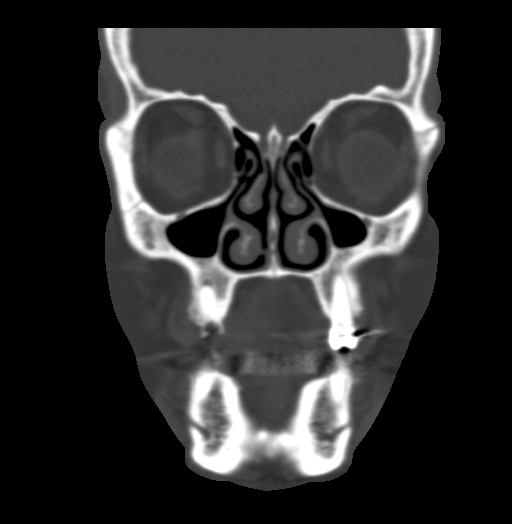
[im 27/60  bone]
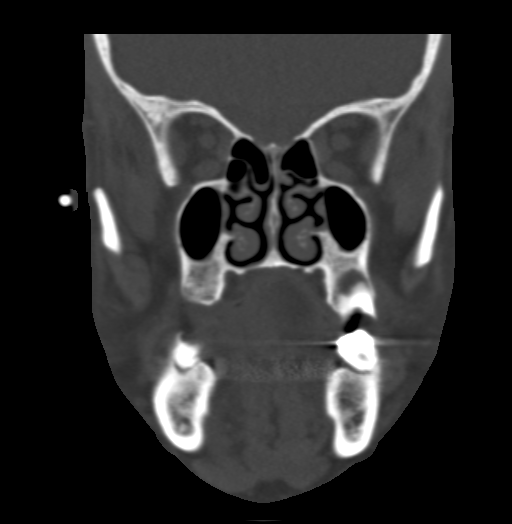
[im 33/60  bone]
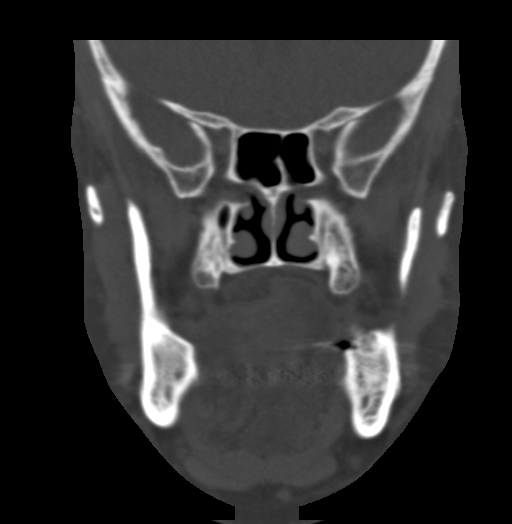

[Series 7: max st sag · sagittal · 0.27mm/px · 3 of 72 slices shown]
[im 24/72  bone]
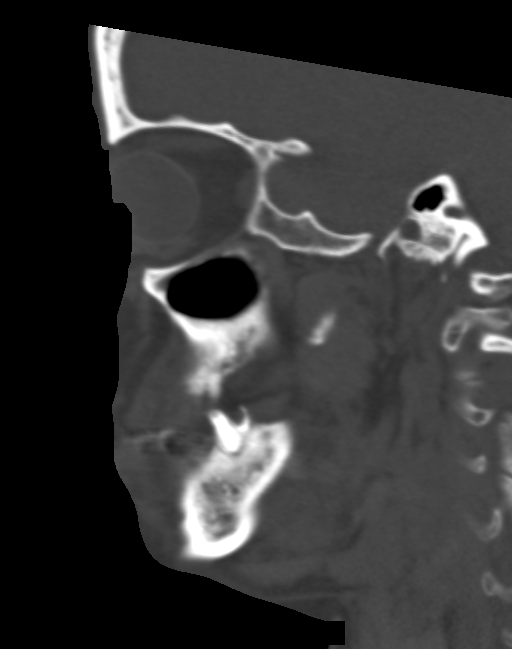
[im 36/72  bone]
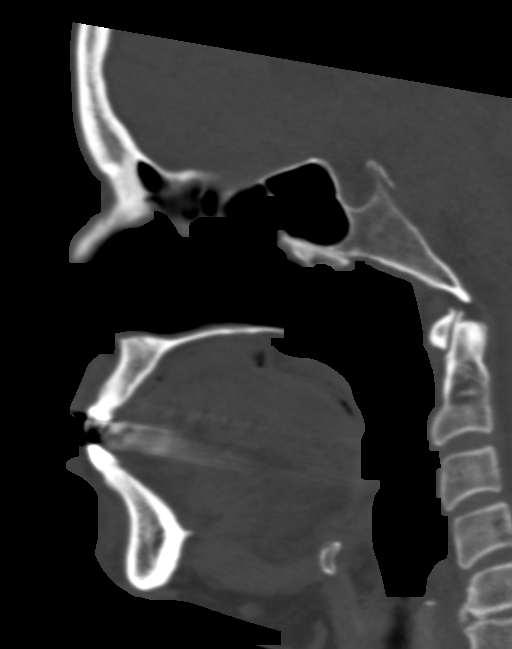
[im 48/72  bone]
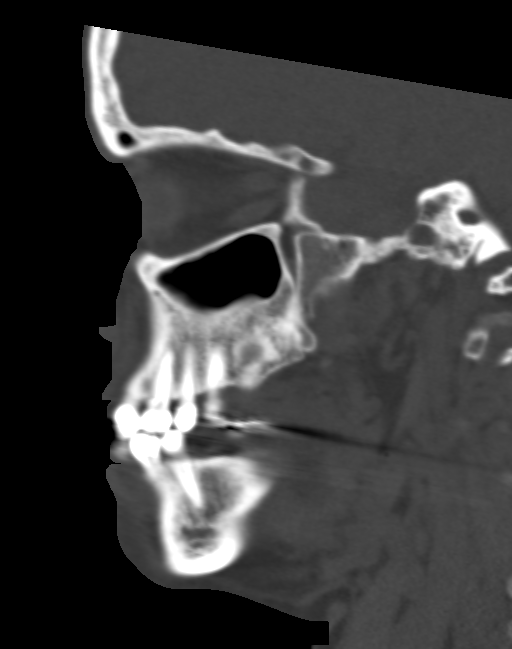

[15 of 47 positions shown; findings below may reference images not displayed]

FINDINGS: CT HEAD FINDINGS

Ventricles are normal in size and configuration. There are no
parenchymal masses or mass effect. There is no evidence of an
infarct. There are no extra-axial masses or abnormal fluid
collections.

There is no intracranial hemorrhage.

Visualized sinuses and mastoid air cells are clear. No skull lesion.

CT MAXILLOFACIAL FINDINGS

Mild nonspecific subcutaneous soft tissue edema. No soft tissue
masses or adenopathy. Normal globes and orbits. Airway is widely
patent.

Sinuses, mastoid air cells and middle ear cavities are clear.

No fracture.  No bone lesion.

There is significant dental disease. There multiple dental [place] is
in several missing teeth. Periapical lucency is noted most evident
involving the last left maxillary molar. There is no adjacent soft
tissue inflammation or fluid collection.
IMPRESSION: HEAD CT:  Normal.

MAXILLOFACIAL CT: Nonspecific mild soft tissue edema. No masses or
adenopathy. There is significant dental disease, this does not
appear to be the etiology of the nonspecific facial swelling.

## 2016-03-20 ENCOUNTER — Ambulatory Visit: Payer: Medicare HMO | Admitting: Gastroenterology

## 2016-03-27 ENCOUNTER — Encounter (HOSPITAL_COMMUNITY): Payer: Self-pay | Admitting: *Deleted

## 2016-03-27 ENCOUNTER — Emergency Department (HOSPITAL_COMMUNITY)
Admission: EM | Admit: 2016-03-27 | Discharge: 2016-03-27 | Disposition: A | Discharge status: Home / Self Care | Payer: Medicare HMO | Attending: Emergency Medicine | Admitting: Emergency Medicine

## 2016-03-27 DIAGNOSIS — I10 Essential (primary) hypertension: Secondary | ICD-10-CM | POA: Insufficient documentation

## 2016-03-27 DIAGNOSIS — K2970 Gastritis, unspecified, without bleeding: Secondary | ICD-10-CM

## 2016-03-27 DIAGNOSIS — Z79899 Other long term (current) drug therapy: Secondary | ICD-10-CM | POA: Insufficient documentation

## 2016-03-27 DIAGNOSIS — F17210 Nicotine dependence, cigarettes, uncomplicated: Secondary | ICD-10-CM | POA: Insufficient documentation

## 2016-03-27 DIAGNOSIS — R69 Illness, unspecified: Secondary | ICD-10-CM | POA: Diagnosis not present

## 2016-03-27 DIAGNOSIS — R1010 Upper abdominal pain, unspecified: Secondary | ICD-10-CM | POA: Diagnosis not present

## 2016-03-27 LAB — CBC WITH DIFFERENTIAL/PLATELET
BASOS ABS: 0 10*3/uL (ref 0.0–0.1)
Basophils Relative: 0 %
EOS PCT: 1 %
Eosinophils Absolute: 0 10*3/uL (ref 0.0–0.7)
HCT: 29.2 % — ABNORMAL LOW (ref 36.0–46.0)
Hemoglobin: 10.4 g/dL — ABNORMAL LOW (ref 12.0–15.0)
LYMPHS PCT: 22 %
Lymphs Abs: 1 10*3/uL (ref 0.7–4.0)
MCH: 30.3 pg (ref 26.0–34.0)
MCHC: 35.6 g/dL (ref 30.0–36.0)
MCV: 85.1 fL (ref 78.0–100.0)
MONO ABS: 0.4 10*3/uL (ref 0.1–1.0)
Monocytes Relative: 8 %
Neutro Abs: 3.1 10*3/uL (ref 1.7–7.7)
Neutrophils Relative %: 69 %
Platelets: 167 10*3/uL (ref 150–400)
RBC: 3.43 MIL/uL — ABNORMAL LOW (ref 3.87–5.11)
RDW: 15.1 % (ref 11.5–15.5)
WBC: 4.5 10*3/uL (ref 4.0–10.5)

## 2016-03-27 LAB — COMPREHENSIVE METABOLIC PANEL
ALBUMIN: 3 g/dL — AB (ref 3.5–5.0)
ALK PHOS: 134 U/L — AB (ref 38–126)
ALT: 59 U/L — AB (ref 14–54)
AST: 166 U/L — AB (ref 15–41)
Anion gap: 13 (ref 5–15)
BILIRUBIN TOTAL: 0.5 mg/dL (ref 0.3–1.2)
BUN: 5 mg/dL — AB (ref 6–20)
CO2: 16 mmol/L — ABNORMAL LOW (ref 22–32)
CREATININE: 0.64 mg/dL (ref 0.44–1.00)
Calcium: 8.4 mg/dL — ABNORMAL LOW (ref 8.9–10.3)
Chloride: 99 mmol/L — ABNORMAL LOW (ref 101–111)
GFR calc Af Amer: >60 mL/min (ref >60)
GLUCOSE: 101 mg/dL — AB (ref 65–99)
POTASSIUM: 3.6 mmol/L (ref 3.5–5.1)
Sodium: 128 mmol/L — ABNORMAL LOW (ref 135–145)
TOTAL PROTEIN: 6.6 g/dL (ref 6.5–8.1)

## 2016-03-27 LAB — ETHANOL: Alcohol, Ethyl (B): 73 mg/dL — ABNORMAL HIGH (ref <5)

## 2016-03-27 LAB — LIPASE, BLOOD: Lipase: 19 U/L (ref 11–51)

## 2016-03-27 MED ORDER — ONDANSETRON HCL 4 MG/2ML IJ SOLN
4.0000 mg | Freq: Once | INTRAMUSCULAR | Status: AC
  Administered 2016-03-27: 4 mg via INTRAVENOUS
  Filled 2016-03-27: qty 2

## 2016-03-27 MED ORDER — TRAMADOL HCL 50 MG PO TABS
50.0000 mg | ORAL_TABLET | Freq: Once | ORAL | Status: AC
  Administered 2016-03-27: 50 mg via ORAL
  Filled 2016-03-27: qty 1

## 2016-03-27 MED ORDER — HYDROMORPHONE HCL 1 MG/ML IJ SOLN
1.0000 mg | Freq: Once | INTRAMUSCULAR | Status: AC
  Administered 2016-03-27: 1 mg via INTRAVENOUS
  Filled 2016-03-27: qty 1

## 2016-03-27 MED ORDER — SODIUM CHLORIDE 0.9 % IV BOLUS (SEPSIS)
2000.0000 mL | Freq: Once | INTRAVENOUS | Status: AC
  Administered 2016-03-27: 2000 mL via INTRAVENOUS

## 2016-03-27 MED ORDER — ONDANSETRON 8 MG PO TBDP: 8.0000 mg | ORAL_TABLET | Freq: Three times a day (TID) | ORAL | 0 refills | Status: DC | PRN

## 2016-03-27 NOTE — ED Provider Notes (Addendum)
[facility] DEPT Provider Note   CSN: [id number] Arrival date & time: 03/27/16  0437     History   Chief Complaint Chief Complaint  Patient presents with  . Abdominal Pain    HPI Angel Price is a 51 y.o. female.  The history is provided by the patient.  pt presents with 3 days of nausea vomiting and upper abdominal pain without hematemesis. No blood in vomit. Hx of ETOH abuse. Denies back or chest pain. No SOB. No diarrhea. Upper abdominal pain is worse with vomiting. No prior hx of ETOH cirrhosis. Symptoms are moderate in severity  Past Medical History:  Diagnosis Date  . Abscess    soft tissue  . Adrenal mass ([place])   . Alcohol abuse   . Anxiety   . Blood transfusion without reported diagnosis   . Chronic abdominal pain   . Chronic wound infection of abdomen   . Colon polyp    colonoscopy 04/2014  . Depression   . Diverticulosis    colonoscopy 04/2014 moderat pan colonic  . Gastritis    EGD 05/2014  . Gastroparesis Nov 2015  . GERD (gastroesophageal reflux disease)   . Hemorrhoid    internal large  . Hiatal hernia   . History of Billroth II operation   . Hypertension   . Lung nodule    CT 02/2014 needs repeat 1 month  . Lung nodule < 6cm on CT 04/25/2014  . Lupus   . Nausea and vomiting    chronic, recurrent  . Schatzki's ring    patent per EGD 04/2014  . Sickle cell trait ([place])   . Suicide attempt   . 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 Absolute anemia   . Pancreatitis, acute   . Acute pancreatitis 10/01/2015  . Abnormal CT scan of lung 10/01/2015  . Alcohol intoxication ([place])   . Left-sided weakness   . Psychosomatic factor in physical condition   . Upper GI bleed   . Diverticulosis of colon with hemorrhage   . Chronic wound infection of abdomen 12/22/2014  . Thyroid disease 12/22/2014  . Hypertension 12/22/2014  . Ataxia 11/01/2014  . Hemorrhoids 04/26/2014  . Lung nodule  04/26/2014  . Diverticulosis   . Gastritis   . Hiatal hernia   . Schatzki's ring   . Acute blood loss anemia 04/25/2014  . Sinus tachycardia 04/25/2014  . Hypokalemia 04/25/2014  . Hyponatremia 04/25/2014  . Hematemesis with nausea 04/25/2014  . Lung nodule < 6cm on CT 04/25/2014  . Intractable nausea and vomiting 04/24/2014  . Gastroparesis   . Abdominal pain 04/01/2014  . Chronic abdominal pain 01/21/2014  . Gastroenteritis 12/10/2013  . Chronic abdominal wound infection 08/16/2013  . MDD (major depressive disorder), recurrent episode, severe ([place]) 06/27/2013  . Wrist laceration 06/24/2013  . Nausea with vomiting 05/07/2013  . Diarrhea 05/07/2013  . Rectal bleeding 05/07/2013  . Abnormal LFTs 05/07/2013  . Adrenal mass, left ([place]) 05/07/2013  . Abscess of abdominal wall 04/19/2013  . Abdominal wall abscess 04/18/2013  . Frequent headaches 03/30/2013  . Sleep difficulties 03/30/2013  . Essential hypertension, benign 03/30/2013  . History of cocaine abuse 03/16/2013  . History of schizoaffective disorder 03/16/2013  . Bipolar disorder ([place]) 03/16/2013  . Personality disorder 03/16/2013  . Tobacco abuse 03/16/2013  . Alcohol abuse 03/16/2013  . Palpitations 03/16/2013  . Poor vision 03/16/2013  . History of gastric bypass  03/16/2013  . Status post hysterectomy with oophorectomy 03/16/2013  . Hypothyroid 03/16/2013  . Lupus ([place]) 03/16/2013    Past Surgical History:  Procedure Laterality Date  . ABDOMINAL HYSTERECTOMY  2013   [place]  . ABDOMINAL SURGERY    . adrenal turmor removal    . AGILE CAPSULE N/A 01/05/2015   Procedure: AGILE CAPSULE;  Surgeon: Daneil Dolin, MD;  Location: [facility];  Service: Endoscopy;  Laterality: N/A;  0700  . Billroth II procedure      [place], first 2000, 2005/2006.  Marland Kitchen BIOPSY  05/20/2013   Procedure: BIOPSIES OF ASCENDING AND SIGMOID COLON;  Surgeon: Daneil Dolin, MD;  Location: [facility] ORS;  Service: Endoscopy;;  . BIOPSY   04/26/2014   Procedure: BIOPSIES;  Surgeon: Danie Binder, MD;  Location: [facility] ORS;  Service: Endoscopy;;  . CHOLECYSTECTOMY    . COLONOSCOPY     in [place]  . COLONOSCOPY WITH PROPOFOL N/A 05/20/2013   Dr.Rourk- inadequate prep, normal appearing rectum, grossly normal colon aside from pancolonic diverticula, normal terminal ileum bx= unremarkable colonic mucosa. Due for early interval 2016.   Marland Kitchen COLONOSCOPY WITH PROPOFOL N/A 04/26/2014   [id number] ileum/one colon polyp removed/moderate pan-colonic diverticulosis/large internal hemorrhoids  . COLONOSCOPY WITH PROPOFOL N/A 12/23/2014   Dr.Rourk- minimal internal hemorrhoids, pancolonic diverticulosis  . DEBRIDEMENT OF ABDOMINAL WALL ABSCESS N/A 02/08/2013   Procedure: DEBRIDEMENT OF ABDOMINAL WALL ABSCESS;  Surgeon: Jamesetta So, MD;  Location: [facility] OR[facility];  Service: General;  Laterality: N/A;  . ESOPHAGOGASTRODUODENOSCOPY (EGD) WITH PROPOFOL N/A 05/20/2013   Dr.Rourk- s/p prior gastric surgery with normal esophagus, residual gastric mucosa and patent efferent limb  . ESOPHAGOGASTRODUODENOSCOPY (EGD) WITH PROPOFOL N/A 02/03/2014   Dr. Gala Romney:  s/p hemigastrectomy with retained gastric contents. Residual gastric mucosa and efferent limb appeared normal otherwise. Query gastroparesis.   Marland Kitchen ESOPHAGOGASTRODUODENOSCOPY (EGD) WITH PROPOFOL N/A 04/26/2014   [id number] ring/small HH/mild non-erosive gasrtitis/normal anastomosis  . ESOPHAGOGASTRODUODENOSCOPY (EGD) WITH PROPOFOL N/A 12/23/2014   Dr.Rourk- s/p prior hemigastrctomy, active oozing from anastomotic suture site, hemostasis achieved  . tendon repar Right    wrist  . WOUND EXPLORATION Right 06/24/2013   Procedure: exploration of traumatic wound right wrist;  Surgeon: Tennis Must, MD;  Location: [facility];  Service: Orthopedics;  Laterality: Right;    OB History    Gravida Para Term Preterm AB Living   4 4 4     3    SAB TAB Ectopic Multiple Live Births                   Home Medications    Prior  to Admission medications   Medication Sig Start Date End Date Taking? Authorizing Provider  doxycycline (VIBRAMYCIN) 100 MG capsule Take 1 capsule (100 mg total) by mouth 2 (two) times daily. 01/02/16   [name], MD  fluticasone (FLONASE) 50 MCG/ACT nasal spray Place 1 spray into both nostrils daily.    Historical Provider, MD  folic acid (FOLVITE) 1 MG tablet Take 1 tablet (1 mg total) by mouth daily. 10/08/15   Rosita Fire, MD  lisinopril (PRINIVIL,ZESTRIL) 10 MG tablet Take 10 mg by mouth daily.    Historical Provider, MD  Multiple Vitamin (MULTIVITAMIN WITH MINERALS) TABS tablet Take 1 tablet by mouth daily. 10/08/15   Rosita Fire, MD  nicotine (NICODERM CQ - DOSED IN MG/24 HOURS) 21 mg/24hr patch Place 1 patch (21 mg total) onto the skin daily. 10/08/15   Rosita Fire, MD  ondansetron (ZOFRAN ODT)  8 MG disintegrating tablet Take 1 tablet (8 mg total) by mouth every 8 (eight) hours as needed for nausea or vomiting. 03/27/16   Jola Schmidt, MD  ondansetron (ZOFRAN) 4 MG tablet Take 1 tablet (4 mg total) by mouth every 6 (six) hours. 02/20/16   Mahala Menghini, PA-C  pantoprazole (PROTONIX) 40 MG tablet Take 1 tablet (40 mg total) by mouth 2 (two) times daily before a meal. Patient taking differently: Take 40 mg by mouth daily.  09/04/14   Daleen Bo, MD  sucralfate (CARAFATE) 1 G tablet Take 1 g by mouth 4 (four) times daily.    Historical Provider, MD  thiamine 100 MG tablet Take 1 tablet (100 mg total) by mouth daily. 10/08/15   Rosita Fire, MD  traMADol (ULTRAM) 50 MG tablet Take 1 tablet (50 mg total) by mouth 3 (three) times daily as needed. 03/13/15   Virgel Manifold, MD    Family History Family History  Problem Relation Age of Onset  . Brain cancer Son   . Schizophrenia Son   . Cancer Son     brain  . Lung cancer Father   . Cancer Father     mets  . Drug abuse Mother   . Breast cancer Maternal Aunt   . Bipolar disorder Maternal Aunt   . Drug abuse Maternal Aunt   . Colon  cancer Maternal Grandmother     late 61s, early 81s  . Drug abuse Sister   . Drug abuse Brother   . Bipolar disorder Paternal Grandfather   . Bipolar disorder Cousin   . Liver disease Neg Hx     Social History Social History  Substance Use Topics  . Smoking status: Current Every Day Smoker    Packs/day: 0.50    Years: 35.00    Types: Cigarettes  . Smokeless tobacco: Never Used  . Alcohol use 7.2 oz/week    12 Cans of beer per week     Comment: 5 12 oz beers a day     Allergies   Codeine and Morphine and related   Review of Systems Review of Systems  All other systems reviewed and are negative.    Physical Exam Updated Vital Signs BP 96/65   Pulse 97   Temp 97.9 F (36.6 C) (Oral)   Resp 17   Ht 5\' 3"  (1.6 m)   Wt 132 lb (59.9 kg)   SpO2 98%   BMI 23.38 kg/m   Physical Exam  Constitutional: She is oriented to person, place, and time. She appears well-developed and well-nourished. No distress.  HENT:  Head: Normocephalic and atraumatic.  Eyes: EOM are normal.  Neck: Normal range of motion.  Cardiovascular: Normal rate, regular rhythm and normal heart sounds.   Pulmonary/Chest: Effort normal and breath sounds normal.  Abdominal: Soft. She exhibits no distension.  Mild upper abdominal tenderness without guarding or rebound.  No peritoneal signs  Musculoskeletal: Normal range of motion.  Neurological: She is alert and oriented to person, place, and time.  Skin: Skin is warm and dry.  Psychiatric: She has a normal mood and affect. Judgment normal.  Nursing note and vitals reviewed.    ED Treatments / Results  Labs (all labs ordered are listed, but only abnormal results are displayed) Labs Reviewed  CBC WITH DIFFERENTIAL/PLATELET - Abnormal; Notable for the following:       Result Value   RBC 3.43 (*)    Hemoglobin 10.4 (*)    HCT 29.2 (*)  All other components within normal limits  COMPREHENSIVE METABOLIC PANEL - Abnormal; Notable for the  following:    Sodium 128 (*)    Chloride 99 (*)    CO2 16 (*)    Glucose, Bld 101 (*)    BUN 5 (*)    Calcium 8.4 (*)    Albumin 3.0 (*)    AST 166 (*)    ALT 59 (*)    Alkaline Phosphatase 134 (*)    All other components within normal limits  ETHANOL - Abnormal; Notable for the following:    Alcohol, Ethyl (B) 73 (*)    All other components within normal limits  LIPASE, BLOOD    EKG  EKG Interpretation None       Radiology No results found.  Procedures Procedures (including critical care time)  Medications Ordered in ED Medications  traMADol (ULTRAM) tablet 50 mg (not administered)  ondansetron (ZOFRAN) injection 4 mg (4 mg Intravenous Given 03/27/16 0543)  sodium chloride 0.9 % bolus 2,000 mL (2,000 mLs Intravenous New Bag/Given 03/27/16 0543)  HYDROmorphone (DILAUDID) injection 1 mg (1 mg Intravenous Given 03/27/16 0543)     Initial Impression / Assessment and Plan / ED Course  I have reviewed the triage vital signs and the nursing notes.  Pertinent labs & imaging results that were available during my care of the patient were reviewed by me and considered in my medical decision making (see chart for details).  Clinical Course     Likely dehydrated given bicarb of 16.  Feels much better after IV fluids.  Pain improved.  Suspect gastritis from ongoing alcohol use.  Recommend abstaining from alcohol and ongoing Protonix use.  Primary care follow-up.  Patient understands to return to the ER for new or worsening symptoms.  Repeat abdominal exam is without peritoneal signs or focal tenderness  Final Clinical Impressions(s) / ED Diagnoses   Final diagnoses:  Upper abdominal pain  Gastritis without bleeding, unspecified chronicity, unspecified gastritis type    New Prescriptions New Prescriptions   ONDANSETRON (ZOFRAN ODT) 8 MG DISINTEGRATING TABLET    Take 1 tablet (8 mg total) by mouth every 8 (eight) hours as needed for nausea or vomiting.     Jola Schmidt,  MD 03/27/16 [place], MD 04/16/16 [phone]

## 2016-03-27 NOTE — ED Triage Notes (Signed)
Pt c/o n/v and abdominal pain x 3 days

## 2016-03-27 NOTE — ED Notes (Signed)
2 IV attempts unsuccesssful

## 2016-04-01 ENCOUNTER — Ambulatory Visit: Payer: Medicare HMO | Admitting: Gastroenterology

## 2016-04-04 ENCOUNTER — Ambulatory Visit: Payer: Medicare HMO | Admitting: Gastroenterology

## 2016-04-20 ENCOUNTER — Emergency Department (HOSPITAL_COMMUNITY)
Admission: EM | Admit: 2016-04-20 | Discharge: 2016-04-21 | Disposition: A | Discharge status: Home / Self Care | Payer: Medicare HMO | Attending: Emergency Medicine | Admitting: Emergency Medicine

## 2016-04-20 ENCOUNTER — Encounter (HOSPITAL_COMMUNITY): Payer: Self-pay | Admitting: *Deleted

## 2016-04-20 ENCOUNTER — Emergency Department (HOSPITAL_COMMUNITY): Payer: Medicare HMO

## 2016-04-20 DIAGNOSIS — R1013 Epigastric pain: Secondary | ICD-10-CM | POA: Insufficient documentation

## 2016-04-20 DIAGNOSIS — R112 Nausea with vomiting, unspecified: Secondary | ICD-10-CM

## 2016-04-20 DIAGNOSIS — F17210 Nicotine dependence, cigarettes, uncomplicated: Secondary | ICD-10-CM | POA: Insufficient documentation

## 2016-04-20 DIAGNOSIS — R1012 Left upper quadrant pain: Secondary | ICD-10-CM | POA: Diagnosis not present

## 2016-04-20 DIAGNOSIS — I10 Essential (primary) hypertension: Secondary | ICD-10-CM | POA: Diagnosis not present

## 2016-04-20 DIAGNOSIS — R69 Illness, unspecified: Secondary | ICD-10-CM | POA: Diagnosis not present

## 2016-04-20 LAB — COMPREHENSIVE METABOLIC PANEL
ALT: 33 U/L (ref 14–54)
AST: 61 U/L — ABNORMAL HIGH (ref 15–41)
Albumin: 3.6 g/dL (ref 3.5–5.0)
Alkaline Phosphatase: 120 U/L (ref 38–126)
Anion gap: 18 — ABNORMAL HIGH (ref 5–15)
BILIRUBIN TOTAL: 0.5 mg/dL (ref 0.3–1.2)
BUN: 8 mg/dL (ref 6–20)
CHLORIDE: 97 mmol/L — AB (ref 101–111)
CO2: 21 mmol/L — ABNORMAL LOW (ref 22–32)
CREATININE: 0.49 mg/dL (ref 0.44–1.00)
Calcium: 8.9 mg/dL (ref 8.9–10.3)
Glucose, Bld: 133 mg/dL — ABNORMAL HIGH (ref 65–99)
Potassium: 3.5 mmol/L (ref 3.5–5.1)
Sodium: 136 mmol/L (ref 135–145)
TOTAL PROTEIN: 7.6 g/dL (ref 6.5–8.1)

## 2016-04-20 LAB — CBC WITH DIFFERENTIAL/PLATELET
BASOS ABS: 0 10*3/uL (ref 0.0–0.1)
BASOS PCT: 0 %
EOS ABS: 0 10*3/uL (ref 0.0–0.7)
Eosinophils Relative: 0 %
HCT: 34.8 % — ABNORMAL LOW (ref 36.0–46.0)
HEMOGLOBIN: 12.5 g/dL (ref 12.0–15.0)
Lymphocytes Relative: 22 %
Lymphs Abs: 1 10*3/uL (ref 0.7–4.0)
MCH: 30 pg (ref 26.0–34.0)
MCHC: 35.9 g/dL (ref 30.0–36.0)
MCV: 83.7 fL (ref 78.0–100.0)
Monocytes Absolute: 0.2 10*3/uL (ref 0.1–1.0)
Monocytes Relative: 4 %
NEUTROS PCT: 74 %
Neutro Abs: 3.4 10*3/uL (ref 1.7–7.7)
Platelets: 298 10*3/uL (ref 150–400)
RBC: 4.16 MIL/uL (ref 3.87–5.11)
RDW: 14.8 % (ref 11.5–15.5)
WBC: 4.5 10*3/uL (ref 4.0–10.5)

## 2016-04-20 LAB — ETHANOL: ALCOHOL ETHYL (B): 189 mg/dL — AB (ref <5)

## 2016-04-20 LAB — LIPASE, BLOOD: LIPASE: 13 U/L (ref 11–51)

## 2016-04-20 MED ORDER — MORPHINE SULFATE (PF) 2 MG/ML IV SOLN
4.0000 mg | Freq: Once | INTRAVENOUS | Status: AC
  Administered 2016-04-20: 4 mg via INTRAVENOUS
  Filled 2016-04-20: qty 2

## 2016-04-20 MED ORDER — SODIUM CHLORIDE 0.9 % IV BOLUS (SEPSIS)
1000.0000 mL | Freq: Once | INTRAVENOUS | Status: AC
  Administered 2016-04-20: 1000 mL via INTRAVENOUS

## 2016-04-20 MED ORDER — ONDANSETRON HCL 4 MG/2ML IJ SOLN
INTRAMUSCULAR | Status: AC
  Filled 2016-04-20: qty 2

## 2016-04-20 MED ORDER — FAMOTIDINE IN NACL 20-0.9 MG/50ML-% IV SOLN
20.0000 mg | Freq: Once | INTRAVENOUS | Status: AC
  Administered 2016-04-20: 20 mg via INTRAVENOUS
  Filled 2016-04-20: qty 50

## 2016-04-20 MED ORDER — HYDROMORPHONE HCL 1 MG/ML IJ SOLN
1.0000 mg | Freq: Once | INTRAMUSCULAR | Status: DC
Start: 2016-04-20 — End: 2016-04-20

## 2016-04-20 MED ORDER — ONDANSETRON 4 MG PO TBDP
ORAL_TABLET | ORAL | Status: AC
  Administered 2016-04-20: 4 mg via ORAL
  Filled 2016-04-20: qty 1

## 2016-04-20 MED ORDER — ACETAMINOPHEN 500 MG PO TABS
1000.0000 mg | ORAL_TABLET | Freq: Once | ORAL | Status: AC
  Administered 2016-04-21: 1000 mg via ORAL
  Filled 2016-04-20: qty 2

## 2016-04-20 MED ORDER — METOCLOPRAMIDE HCL 5 MG/ML IJ SOLN
10.0000 mg | Freq: Once | INTRAMUSCULAR | Status: AC
  Administered 2016-04-20: 10 mg via INTRAVENOUS
  Filled 2016-04-20: qty 2

## 2016-04-20 MED ORDER — PROMETHAZINE HCL 25 MG PO TABS: 25.0000 mg | ORAL_TABLET | Freq: Four times a day (QID) | ORAL | 0 refills | Status: DC | PRN

## 2016-04-20 MED ORDER — HYDROMORPHONE HCL 1 MG/ML IJ SOLN
INTRAMUSCULAR | Status: AC
  Administered 2016-04-20: 1 mg
  Filled 2016-04-20: qty 1

## 2016-04-20 MED ORDER — ONDANSETRON 4 MG PO TBDP
4.0000 mg | ORAL_TABLET | Freq: Once | ORAL | Status: AC | PRN
  Administered 2016-04-20: 4 mg via ORAL

## 2016-04-20 MED ORDER — PROMETHAZINE HCL 12.5 MG PO TABS
25.0000 mg | ORAL_TABLET | Freq: Once | ORAL | Status: AC
  Administered 2016-04-21: 25 mg via ORAL
  Filled 2016-04-20: qty 2

## 2016-04-20 NOTE — ED Notes (Addendum)
Patient was observed by lab tech sticking her fingers down her throat.  When lab tech asked what she was doing, patient stated to lab tach that she needed to burp and could not; so that's why she was doing it.

## 2016-04-20 NOTE — ED Provider Notes (Signed)
[facility] DEPT Provider Note   CSN: [id number] Arrival date & time: 04/20/16  2010 By signing my name below, I, Dyke Brackett, attest that this documentation has been prepared under the direction and in the presence of Forde Dandy, MD . Electronically Signed: Dyke Brackett, Scribe. 04/20/2016. 9:03 PM.    History   Chief Complaint Chief Complaint  Patient presents with  . Emesis   HPI Angel Price is a 51 y.o. female with a PMHx of alcohol abuse, diverticulosis, and gastroparesis, who presents to the Emergency Department complaining of intermittent vomiting onset yesterday. Per pt, her emesis was blood tinged today PTA. She notes associated upper abdominal pain and constant nausea. She describes her abdominal pain as sharp and severe. No alleviating or modifying factors noted.  Pt was given zofran with no relief of nausea. She states she drank 6 beers before symptoms began. Her last BM was two days ago.She denies any diarrhea, fever, melena, hematochezia, dysuria, increased urinary frequency, abdominal distention. History of cholecystectomy, abdominal hysterectomy, and biliroth II procedure.  The history is provided by the patient. No language interpreter was used.    Past Medical History:  Diagnosis Date  . Abscess    soft tissue  . Adrenal mass ([place])   . Alcohol abuse   . Anxiety   . Blood transfusion without reported diagnosis   . Chronic abdominal pain   . Chronic wound infection of abdomen   . Colon polyp    colonoscopy 04/2014  . Depression   . Diverticulosis    colonoscopy 04/2014 moderat pan colonic  . Gastritis    EGD 05/2014  . Gastroparesis Nov 2015  . GERD (gastroesophageal reflux disease)   . Hemorrhoid    internal large  . Hiatal hernia   . History of Billroth II operation   . Hypertension   . Lung nodule    CT 02/2014 needs repeat 1 month  . Lung nodule < 6cm on CT 04/25/2014  . Lupus   . Nausea and vomiting    chronic, recurrent  . Schatzki's ring     patent per EGD 04/2014  . Sickle cell trait ([place])   . Suicide attempt   . 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 Absolute anemia   . Pancreatitis, acute   . Acute pancreatitis 10/01/2015  . Abnormal CT scan of lung 10/01/2015  . Alcohol intoxication ([place])   . Left-sided weakness   . Psychosomatic factor in physical condition   . Upper GI bleed   . Diverticulosis of colon with hemorrhage   . Chronic wound infection of abdomen 12/22/2014  . Thyroid disease 12/22/2014  . Hypertension 12/22/2014  . Ataxia 11/01/2014  . Hemorrhoids 04/26/2014  . Lung nodule 04/26/2014  . Diverticulosis   . Gastritis   . Hiatal hernia   . Schatzki's ring   . Acute blood loss anemia 04/25/2014  . Sinus tachycardia 04/25/2014  . Hypokalemia 04/25/2014  . Hyponatremia 04/25/2014  . Hematemesis with nausea 04/25/2014  . Lung nodule < 6cm on CT 04/25/2014  . Intractable nausea and vomiting 04/24/2014  . Gastroparesis   . Abdominal pain 04/01/2014  . Chronic abdominal pain 01/21/2014  . Gastroenteritis 12/10/2013  . Chronic abdominal wound infection 08/16/2013  . MDD (major depressive disorder), recurrent episode, severe ([place]) 06/27/2013  . Wrist laceration 06/24/2013  . Nausea with vomiting 05/07/2013  . Diarrhea 05/07/2013  . Rectal bleeding 05/07/2013  .  Abnormal LFTs 05/07/2013  . Adrenal mass, left ([place]) 05/07/2013  . Abscess of abdominal wall 04/19/2013  . Abdominal wall abscess 04/18/2013  . Frequent headaches 03/30/2013  . Sleep difficulties 03/30/2013  . Essential hypertension, benign 03/30/2013  . History of cocaine abuse 03/16/2013  . History of schizoaffective disorder 03/16/2013  . Bipolar disorder ([place]) 03/16/2013  . Personality disorder 03/16/2013  . Tobacco abuse 03/16/2013  . Alcohol abuse 03/16/2013  . Palpitations 03/16/2013  . Poor vision 03/16/2013  . History of gastric bypass 03/16/2013  . Status  post hysterectomy with oophorectomy 03/16/2013  . Hypothyroid 03/16/2013  . Lupus ([place]) 03/16/2013    Past Surgical History:  Procedure Laterality Date  . ABDOMINAL HYSTERECTOMY  2013   [place]  . ABDOMINAL SURGERY    . adrenal turmor removal    . AGILE CAPSULE N/A 01/05/2015   Procedure: AGILE CAPSULE;  Surgeon: Daneil Dolin, MD;  Location: [facility];  Service: Endoscopy;  Laterality: N/A;  0700  . Billroth II procedure      [place], first 2000, 2005/2006.  Marland Kitchen BIOPSY  05/20/2013   Procedure: BIOPSIES OF ASCENDING AND SIGMOID COLON;  Surgeon: Daneil Dolin, MD;  Location: [facility] ORS;  Service: Endoscopy;;  . BIOPSY  04/26/2014   Procedure: BIOPSIES;  Surgeon: Danie Binder, MD;  Location: [facility] ORS;  Service: Endoscopy;;  . CHOLECYSTECTOMY    . COLONOSCOPY     in [place]  . COLONOSCOPY WITH PROPOFOL N/A 05/20/2013   Dr.Rourk- inadequate prep, normal appearing rectum, grossly normal colon aside from pancolonic diverticula, normal terminal ileum bx= unremarkable colonic mucosa. Due for early interval 2016.   Marland Kitchen COLONOSCOPY WITH PROPOFOL N/A 04/26/2014   [id number] ileum/one colon polyp removed/moderate pan-colonic diverticulosis/large internal hemorrhoids  . COLONOSCOPY WITH PROPOFOL N/A 12/23/2014   Dr.Rourk- minimal internal hemorrhoids, pancolonic diverticulosis  . DEBRIDEMENT OF ABDOMINAL WALL ABSCESS N/A 02/08/2013   Procedure: DEBRIDEMENT OF ABDOMINAL WALL ABSCESS;  Surgeon: Jamesetta So, MD;  Location: [facility];  Service: General;  Laterality: N/A;  . ESOPHAGOGASTRODUODENOSCOPY (EGD) WITH PROPOFOL N/A 05/20/2013   Dr.Rourk- s/p prior gastric surgery with normal esophagus, residual gastric mucosa and patent efferent limb  . ESOPHAGOGASTRODUODENOSCOPY (EGD) WITH PROPOFOL N/A 02/03/2014   Dr. Gala Romney:  s/p hemigastrectomy with retained gastric contents. Residual gastric mucosa and efferent limb appeared normal otherwise. Query gastroparesis.   Marland Kitchen ESOPHAGOGASTRODUODENOSCOPY (EGD) WITH PROPOFOL  N/A 04/26/2014   [id number] ring/small HH/mild non-erosive gasrtitis/normal anastomosis  . ESOPHAGOGASTRODUODENOSCOPY (EGD) WITH PROPOFOL N/A 12/23/2014   Dr.Rourk- s/p prior hemigastrctomy, active oozing from anastomotic suture site, hemostasis achieved  . tendon repar Right    wrist  . WOUND EXPLORATION Right 06/24/2013   Procedure: exploration of traumatic wound right wrist;  Surgeon: Tennis Must, MD;  Location: [facility];  Service: Orthopedics;  Laterality: Right;    OB History    Gravida Para Term Preterm AB Living   4 4 4     3    SAB TAB Ectopic Multiple Live Births                   Home Medications    Prior to Admission medications   Medication Sig Start Date End Date Taking? Authorizing Provider  doxycycline (VIBRAMYCIN) 100 MG capsule Take 1 capsule (100 mg total) by mouth 2 (two) times daily. 01/02/16   [name], MD  fluticasone (FLONASE) 50 MCG/ACT nasal spray Place 1 spray into both nostrils daily.    Historical Provider, MD  folic acid (  FOLVITE) 1 MG tablet Take 1 tablet (1 mg total) by mouth daily. 10/08/15   Rosita Fire, MD  lisinopril (PRINIVIL,ZESTRIL) 10 MG tablet Take 10 mg by mouth daily.    Historical Provider, MD  Multiple Vitamin (MULTIVITAMIN WITH MINERALS) TABS tablet Take 1 tablet by mouth daily. 10/08/15   Rosita Fire, MD  nicotine (NICODERM CQ - DOSED IN MG/24 HOURS) 21 mg/24hr patch Place 1 patch (21 mg total) onto the skin daily. 10/08/15   Rosita Fire, MD  ondansetron (ZOFRAN ODT) 8 MG disintegrating tablet Take 1 tablet (8 mg total) by mouth every 8 (eight) hours as needed for nausea or vomiting. 03/27/16   Jola Schmidt, MD  ondansetron (ZOFRAN) 4 MG tablet Take 1 tablet (4 mg total) by mouth every 6 (six) hours. 02/20/16   Mahala Menghini, PA-C  pantoprazole (PROTONIX) 40 MG tablet Take 1 tablet (40 mg total) by mouth 2 (two) times daily before a meal. Patient taking differently: Take 40 mg by mouth daily.  09/04/14   Daleen Bo, MD  promethazine  (PHENERGAN) 25 MG tablet Take 1 tablet (25 mg total) by mouth every 6 (six) hours as needed for nausea or vomiting. 04/20/16   Forde Dandy, MD  sucralfate (CARAFATE) 1 G tablet Take 1 g by mouth 4 (four) times daily.    Historical Provider, MD  thiamine 100 MG tablet Take 1 tablet (100 mg total) by mouth daily. 10/08/15   Rosita Fire, MD  traMADol (ULTRAM) 50 MG tablet Take 1 tablet (50 mg total) by mouth 3 (three) times daily as needed. 03/13/15   Virgel Manifold, MD    Family History Family History  Problem Relation Age of Onset  . Brain cancer Son   . Schizophrenia Son   . Cancer Son     brain  . Lung cancer Father   . Cancer Father     mets  . Drug abuse Mother   . Breast cancer Maternal Aunt   . Bipolar disorder Maternal Aunt   . Drug abuse Maternal Aunt   . Colon cancer Maternal Grandmother     late 99s, early 49s  . Drug abuse Sister   . Drug abuse Brother   . Bipolar disorder Paternal Grandfather   . Bipolar disorder Cousin   . Liver disease Neg Hx     Social History Social History  Substance Use Topics  . Smoking status: Current Every Day Smoker    Packs/day: 0.50    Years: 35.00    Types: Cigarettes  . Smokeless tobacco: Never Used  . Alcohol use 7.2 oz/week    12 Cans of beer per week     Comment: 5 12 oz beers a day     Allergies   Codeine and Morphine and related   Review of Systems Review of Systems 10 systems reviewed and all are negative for acute change except as noted in the HPI.  Physical Exam Updated Vital Signs BP 116/86   Pulse 102   Temp 97.8 F (36.6 C) (Oral)   Resp 18   Ht 5\' 3"  (1.6 m)   Wt 132 lb (59.9 kg)   SpO2 99%   BMI 23.38 kg/m   Physical Exam Physical Exam  Nursing note and vitals reviewed. Constitutional: Non-toxic and in no acute distress Head: Normocephalic and atraumatic.  Mouth/Throat: Oropharynx is clear. Dry mucus membranes.  Neck: Normal range of motion. Neck supple.  Cardiovascular: Tachycardic rate and  regular rhythm.   Pulmonary/Chest: Effort normal  and breath sounds normal.  Abdominal: Soft. Non distended. Tenderness in the epigastric and RUQ. There is no rebound and no guarding. Guaiac negative stool. Musculoskeletal: Normal range of motion.  Neurological: Alert, no facial droop, fluent speech, moves all extremities symmetrically Skin: Skin is warm and dry.  Psychiatric: Cooperative   ED Treatments / Results  DIAGNOSTIC STUDIES:  Oxygen Saturation is 99% on RA, normal by my interpretation.    COORDINATION OF CARE:  9:02 PM Discussed treatment plan which includes Zofran, fluids, and lab work with pt at bedside and pt agreed to plan.   Labs (all labs ordered are listed, but only abnormal results are displayed) Labs Reviewed  COMPREHENSIVE METABOLIC PANEL - Abnormal; Notable for the following:       Result Value   Chloride 97 (*)    CO2 21 (*)    Glucose, Bld 133 (*)    AST 61 (*)    Anion gap 18 (*)    All other components within normal limits  CBC WITH DIFFERENTIAL/PLATELET - Abnormal; Notable for the following:    HCT 34.8 (*)    All other components within normal limits  ETHANOL - Abnormal; Notable for the following:    Alcohol, Ethyl (B) 189 (*)    All other components within normal limits  LIPASE, BLOOD  URINALYSIS, ROUTINE W REFLEX MICROSCOPIC  POC OCCULT BLOOD, ED    EKG  EKG Interpretation None       Radiology Dg Abd Acute W/chest  Result Date: 04/20/2016 CLINICAL DATA:  N/V, Right and left upper quadrant abd pain, productive cough x 2 days. History of HTN, GERD, lupus, lung nodule, hemorrhoid, diverticulosis, gastritis, hiatal hernia, debridement abd wall abscess, cholecystectomy, hysterectomy. EXAM: DG ABDOMEN ACUTE W/ 1V CHEST COMPARISON:  Chest x-ray dated 09/25/2015. FINDINGS: Single view of the chest: Heart size and mediastinal contours are normal. Subtle small nodular densities again noted within the right upper lung, stable, presumably corresponding  to the chronic scarring described on earlier CT report of 03/16/2015. No new lung findings. No pleural effusion or pneumothorax seen. Osseous structures about the chest are unremarkable. Supine and upright views of the abdomen: Bowel gas pattern is nonobstructive. Fairly large amount of stool noted within the grossly nondistended colon. No evidence of soft tissue mass or abnormal fluid collection. No evidence of free intraperitoneal air. Surgical clips and sutures again noted. Osseous structures of the abdomen and pelvis are unremarkable. IMPRESSION: 1. No evidence of acute cardiopulmonary abnormality. No evidence of pneumonia or pulmonary edema. 2. Nonobstructive bowel gas pattern. Fairly large amount of stool within the colon (constipation? ). Electronically Signed   By: Franki Cabot M.D.   On: 04/20/2016 21:37    Procedures Procedures (including critical care time)  Medications Ordered in ED Medications  ondansetron [facility]) 4 MG/2ML injection (  Not Given 04/20/16 2226)  acetaminophen (TYLENOL) tablet 1,000 mg (not administered)  promethazine (PHENERGAN) tablet 25 mg (not administered)  metoCLOPramide (REGLAN) injection 10 mg (10 mg Intravenous Given 04/20/16 2208)  sodium chloride 0.9 % bolus 1,000 mL (1,000 mLs Intravenous New Bag/Given 04/20/16 2207)  morphine 2 MG/ML injection 4 mg (4 mg Intravenous Given 04/20/16 2209)  famotidine (PEPCID) IVPB 20 mg premix (0 mg Intravenous Stopped 04/20/16 2311)  HYDROmorphone (DILAUDID) 1 MG/ML injection (1 mg  Given 04/20/16 2219)  ondansetron (ZOFRAN-ODT) disintegrating tablet 4 mg (4 mg Oral Given 04/20/16 2220)     Initial Impression / Assessment and Plan / ED Course  I have reviewed the  triage vital signs and the nursing notes.  Pertinent labs & imaging results that were available during my care of the patient were reviewed by me and considered in my medical decision making (see chart for details).     51 year old female who presents with nausea,  vomiting, upper abdominal pain in setting of alcohol intake. Non-toxic and in no acute distress. Abdomen soft, benign and not acutely surgical. With epigastric RUQ tenderness. LFTs and lipase unremarkable. Low suspicion for bowel obstruction or other serious intraabdominal process at this time. Suspect alcoholic gastritis. Blood with with mildly decreased bicarbonate of 21 and slight AG of 18. Suspect potential ketosis from alcohol. hgb stable, guaiac negative stool. Suspect likely mallory weiss tear from vomiting. Given  IVF, antiemetics and pain control.   Later on lab tech observed patient sticking fingers down throat to induce vomiting.   Observed in ED. No further vomiting. Tolerating gingerale. Discussed alcohol cessation. Feels improved. Felt to be stable for discharge home with supportive care instructions. Strict return and follow-up instructions reviewed. She expressed understanding of all discharge instructions and felt comfortable with the plan of care.   Final Clinical Impressions(s) / ED Diagnoses   Final diagnoses:  Non-intractable vomiting with nausea, unspecified vomiting type  Epigastric abdominal pain    New Prescriptions New Prescriptions   PROMETHAZINE (PHENERGAN) 25 MG TABLET    Take 1 tablet (25 mg total) by mouth every 6 (six) hours as needed for nausea or vomiting.   I personally performed the services described in this documentation, which was scribed in my presence. The recorded information has been reviewed and is accurate.    Forde Dandy, MD 04/20/16 [phone]

## 2016-04-20 NOTE — Discharge Instructions (Signed)
Your abdominal pain and vomiting is likely related to alcohol. Please cut down to improve symptoms.  Take tylenol for pain control. Take nausea medication as prescribed.  Return for worsening symptoms, including fever, intractable vomiting, confusion, or any other symptoms concerning to you.

## 2016-04-20 NOTE — ED Triage Notes (Addendum)
Pt reports emesis since yesterday. Pt states before she got here, her emesis was blood tinged. Pt denies diarrhea and fever. Pt now c/o abdominal pain. Pt reports drinking 5-7 beers today.

## 2016-04-21 DIAGNOSIS — I10 Essential (primary) hypertension: Secondary | ICD-10-CM | POA: Diagnosis not present

## 2016-04-21 DIAGNOSIS — R1013 Epigastric pain: Secondary | ICD-10-CM | POA: Diagnosis not present

## 2016-04-21 DIAGNOSIS — R69 Illness, unspecified: Secondary | ICD-10-CM | POA: Diagnosis not present

## 2016-04-21 DIAGNOSIS — R112 Nausea with vomiting, unspecified: Secondary | ICD-10-CM | POA: Diagnosis not present

## 2016-04-25 ENCOUNTER — Encounter: Payer: Self-pay | Admitting: Gastroenterology

## 2016-04-25 ENCOUNTER — Emergency Department (HOSPITAL_COMMUNITY): Payer: Medicare HMO

## 2016-04-25 ENCOUNTER — Ambulatory Visit (INDEPENDENT_AMBULATORY_CARE_PROVIDER_SITE_OTHER): Payer: Medicare HMO | Admitting: Gastroenterology

## 2016-04-25 ENCOUNTER — Emergency Department (HOSPITAL_COMMUNITY)
Admission: EM | Admit: 2016-04-25 | Discharge: 2016-04-25 | Disposition: A | Discharge status: Home / Self Care | Payer: Medicare HMO | Attending: Emergency Medicine | Admitting: Emergency Medicine

## 2016-04-25 ENCOUNTER — Encounter (HOSPITAL_COMMUNITY): Payer: Self-pay | Admitting: Emergency Medicine

## 2016-04-25 VITALS — BP 116/86 | HR 121 | Temp 98.4°F | Ht 62.0 in | Wt 132.2 lb

## 2016-04-25 DIAGNOSIS — R1013 Epigastric pain: Secondary | ICD-10-CM | POA: Diagnosis not present

## 2016-04-25 DIAGNOSIS — R112 Nausea with vomiting, unspecified: Secondary | ICD-10-CM | POA: Diagnosis not present

## 2016-04-25 DIAGNOSIS — R1010 Upper abdominal pain, unspecified: Secondary | ICD-10-CM

## 2016-04-25 DIAGNOSIS — I10 Essential (primary) hypertension: Secondary | ICD-10-CM | POA: Insufficient documentation

## 2016-04-25 DIAGNOSIS — E039 Hypothyroidism, unspecified: Secondary | ICD-10-CM | POA: Diagnosis not present

## 2016-04-25 DIAGNOSIS — F17210 Nicotine dependence, cigarettes, uncomplicated: Secondary | ICD-10-CM | POA: Insufficient documentation

## 2016-04-25 DIAGNOSIS — Z79899 Other long term (current) drug therapy: Secondary | ICD-10-CM | POA: Insufficient documentation

## 2016-04-25 DIAGNOSIS — K5900 Constipation, unspecified: Secondary | ICD-10-CM | POA: Diagnosis not present

## 2016-04-25 DIAGNOSIS — R69 Illness, unspecified: Secondary | ICD-10-CM | POA: Diagnosis not present

## 2016-04-25 DIAGNOSIS — R1110 Vomiting, unspecified: Secondary | ICD-10-CM | POA: Diagnosis not present

## 2016-04-25 LAB — CBC WITH DIFFERENTIAL/PLATELET
Basophils Absolute: 0 10*3/uL (ref 0.0–0.1)
Basophils Relative: 0 %
EOS ABS: 0.1 10*3/uL (ref 0.0–0.7)
Eosinophils Relative: 1 %
HEMATOCRIT: 30.6 % — AB (ref 36.0–46.0)
HEMOGLOBIN: 10.8 g/dL — AB (ref 12.0–15.0)
LYMPHS ABS: 2 10*3/uL (ref 0.7–4.0)
LYMPHS PCT: 34 %
MCH: 29.8 pg (ref 26.0–34.0)
MCHC: 35.3 g/dL (ref 30.0–36.0)
MCV: 84.5 fL (ref 78.0–100.0)
Monocytes Absolute: 0.8 10*3/uL (ref 0.1–1.0)
Monocytes Relative: 14 %
NEUTROS ABS: 3 10*3/uL (ref 1.7–7.7)
NEUTROS PCT: 51 %
Platelets: 212 10*3/uL (ref 150–400)
RBC: 3.62 MIL/uL — AB (ref 3.87–5.11)
RDW: 13.7 % (ref 11.5–15.5)
WBC: 5.8 10*3/uL (ref 4.0–10.5)

## 2016-04-25 LAB — ETHANOL: Alcohol, Ethyl (B): <5 mg/dL (ref <5)

## 2016-04-25 LAB — URINALYSIS, COMPLETE (UACMP) WITH MICROSCOPIC
BILIRUBIN URINE: NEGATIVE
Glucose, UA: NEGATIVE mg/dL
HGB URINE DIPSTICK: NEGATIVE
Ketones, ur: NEGATIVE mg/dL
Nitrite: NEGATIVE
PROTEIN: NEGATIVE mg/dL
Specific Gravity, Urine: 1.014 (ref 1.005–1.030)
pH: 5 (ref 5.0–8.0)

## 2016-04-25 LAB — COMPREHENSIVE METABOLIC PANEL
ALT: 30 U/L (ref 14–54)
ANION GAP: 7 (ref 5–15)
AST: 77 U/L — ABNORMAL HIGH (ref 15–41)
Albumin: 3.4 g/dL — ABNORMAL LOW (ref 3.5–5.0)
Alkaline Phosphatase: 114 U/L (ref 38–126)
BUN: 6 mg/dL (ref 6–20)
CHLORIDE: 102 mmol/L (ref 101–111)
CO2: 25 mmol/L (ref 22–32)
Calcium: 9 mg/dL (ref 8.9–10.3)
Creatinine, Ser: 0.57 mg/dL (ref 0.44–1.00)
GFR calc non Af Amer: >60 mL/min (ref >60)
GLUCOSE: 101 mg/dL — AB (ref 65–99)
POTASSIUM: 4.7 mmol/L (ref 3.5–5.1)
SODIUM: 134 mmol/L — AB (ref 135–145)
Total Bilirubin: 0.7 mg/dL (ref 0.3–1.2)
Total Protein: 7.2 g/dL (ref 6.5–8.1)

## 2016-04-25 LAB — LIPASE, BLOOD: Lipase: 13 U/L (ref 11–51)

## 2016-04-25 MED ORDER — ONDANSETRON HCL 4 MG/2ML IJ SOLN
4.0000 mg | Freq: Once | INTRAMUSCULAR | Status: AC
  Administered 2016-04-25: 4 mg via INTRAVENOUS
  Filled 2016-04-25: qty 2

## 2016-04-25 MED ORDER — SODIUM CHLORIDE 0.9 % IV BOLUS (SEPSIS)
1000.0000 mL | Freq: Once | INTRAVENOUS | Status: AC
  Administered 2016-04-25: 1000 mL via INTRAVENOUS

## 2016-04-25 MED ORDER — POLYETHYLENE GLYCOL 3350 17 G PO PACK: 17.0000 g | PACK | Freq: Every day | ORAL | 0 refills | Status: DC

## 2016-04-25 MED ORDER — IOPAMIDOL (ISOVUE-300) INJECTION 61%
INTRAVENOUS | Status: AC
  Administered 2016-04-25: 30 mL
  Filled 2016-04-25: qty 30

## 2016-04-25 MED ORDER — FENTANYL CITRATE (PF) 100 MCG/2ML IJ SOLN
50.0000 ug | Freq: Once | INTRAMUSCULAR | Status: AC
  Administered 2016-04-25: 50 ug via INTRAVENOUS
  Filled 2016-04-25: qty 2

## 2016-04-25 MED ORDER — METOCLOPRAMIDE HCL 5 MG/ML IJ SOLN
10.0000 mg | Freq: Once | INTRAMUSCULAR | Status: AC
  Administered 2016-04-25: 10 mg via INTRAVENOUS
  Filled 2016-04-25: qty 2

## 2016-04-25 MED ORDER — SODIUM CHLORIDE 0.9 % IV SOLN
INTRAVENOUS | Status: DC
  Administered 2016-04-25: 125 mL/h via INTRAVENOUS

## 2016-04-25 MED ORDER — IOPAMIDOL (ISOVUE-300) INJECTION 61%
100.0000 mL | Freq: Once | INTRAVENOUS | Status: AC | PRN
  Administered 2016-04-25: 100 mL via INTRAVENOUS

## 2016-04-25 MED ORDER — PROMETHAZINE HCL 25 MG RE SUPP: 25.0000 mg | Freq: Four times a day (QID) | RECTAL | 0 refills | Status: DC | PRN

## 2016-04-25 MED ORDER — DIPHENHYDRAMINE HCL 50 MG/ML IJ SOLN
12.5000 mg | Freq: Once | INTRAMUSCULAR | Status: AC
  Administered 2016-04-25: 12.5 mg via INTRAVENOUS
  Filled 2016-04-25: qty 1

## 2016-04-25 NOTE — ED Notes (Signed)
Pt drinking contrast, making nausea

## 2016-04-25 NOTE — ED Triage Notes (Signed)
Nausea and vomiting x 1 week, abdominal pain

## 2016-04-25 NOTE — Assessment & Plan Note (Addendum)
51 year old female with a history of ETOH pancreatitis, alcohol abuse, gastroparesis, anemia of chronic disease, non-compliant with follow-up, presenting with intractable nausea, vomiting, epigastric pain. While in office, she is actively heaving. Notes "streaky" blood in emesis but does not sound substantial and likely secondary to persistent vomiting. No concern for active GI bleeding at this time. Endorses ETOH intake on Thursday (which was the 1st) but states only a small amount. However, she presented to the ED on 2/3 with significantly elevated ethanol level at 189. She admits to attempting to drink beer yesterday but was unable to do so. From her report, she is unable to tolerate any liquids due to intractable vomiting. Discussed with Dr. Jerilee Hoh regarding direct admit vs ED presentation. At this point, I have directed the patient to present to the ED for triage. If need for admission, we will follow while inpatient.

## 2016-04-25 NOTE — ED Notes (Signed)
Pt states she is constipated, has not had BM in a week, taking dulcolax and drank mag, but vomiting after

## 2016-04-25 NOTE — Discharge Instructions (Signed)
As discussed, your lab tests and CT scan are stable, but you do have significant constipation.  I recommend using the phenergan suppository prescribed if your vomiting persists and you are unable to keep your zofran (or the phenergan tablet you were prescribed here at your last visit) down.  You have also been prescribed miralax to help with constipation.  Follow up with your GI doctor or your primary doctor as needed.

## 2016-04-25 NOTE — ED Notes (Signed)
Two attempts for IV, hard is hard stick, bruising to both arms

## 2016-04-25 NOTE — ED Notes (Signed)
Patient given discharge instruction, verbalized understand. IV removed, band aid applied. Patient ambulatory out of the department.  

## 2016-04-25 NOTE — Patient Instructions (Signed)
I am sending you to the emergency room for triage, as there could be a number of things going on. It could be possible that you receive IV fluids, IV nausea medication, and get your symptoms under control. However, it could be that you do need to be admitted, and I have given the hospitalist a heads up about this. If you do need admission due to your labs and any imaging done, they will do so there and we will follow you during the hospitalization.

## 2016-04-25 NOTE — ED Provider Notes (Signed)
[facility] DEPT Provider Note   CSN: [id number] Arrival date & time: 04/25/16  0957     History   Chief Complaint Chief Complaint  Patient presents with  . Abdominal Pain    HPI Angel Price is a 51 y.o. female with a past medical history including alcohol abuse (endorses tried to drink 2 days ago but was unable to keep it down), diverticulosis and gastroparesis has had persistent upper abdominal sharp pain along with intractable vomiting and dry heaves.  She was last seen for this here 5 days ago and reports no improvement since that time.  She was also seen by her GI provider this am and was sent here for further evaluation.  She has tried to take zofran prior to arrival but was unable to keep down.  She denies fevers, chills, diarrhea, dysuria, melena or brbpr despite her known chronic hemorrhoids.  She does report mild intermittent red streaking in her emesis. Her last bm was over a week ago.  Surgical history includes cholecystectomy, bilroth II procedure with several revisions (reports secondary to gastric and duodenal ulcers) and abdominal hysterectomy.  She has found no alleviators.  The history is provided by the patient.    Past Medical History:  Diagnosis Date  . Abscess    soft tissue  . Adrenal mass ([place])   . Alcohol abuse   . Anxiety   . Blood transfusion without reported diagnosis   . Chronic abdominal pain   . Chronic wound infection of abdomen   . Colon polyp    colonoscopy 04/2014  . Depression   . Diverticulosis    colonoscopy 04/2014 moderat pan colonic  . Gastritis    EGD 05/2014  . Gastroparesis Nov 2015  . GERD (gastroesophageal reflux disease)   . Hemorrhoid    internal large  . Hiatal hernia   . History of Billroth II operation   . Hypertension   . Lung nodule    CT 02/2014 needs repeat 1 month  . Lung nodule < 6cm on CT 04/25/2014  . Lupus   . Nausea and vomiting    chronic, recurrent  . Schatzki's ring    patent per EGD 04/2014  .  Sickle cell trait ([place])   . Suicide attempt   . 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 Absolute anemia   . Pancreatitis, acute   . Acute pancreatitis 10/01/2015  . Abnormal CT scan of lung 10/01/2015  . Alcohol intoxication ([place])   . Left-sided weakness   . Psychosomatic factor in physical condition   . Upper GI bleed   . Diverticulosis of colon with hemorrhage   . Chronic wound infection of abdomen 12/22/2014  . Thyroid disease 12/22/2014  . Hypertension 12/22/2014  . Ataxia 11/01/2014  . Hemorrhoids 04/26/2014  . Lung nodule 04/26/2014  . Diverticulosis   . Gastritis   . Hiatal hernia   . Schatzki's ring   . Acute blood loss anemia 04/25/2014  . Sinus tachycardia 04/25/2014  . Hypokalemia 04/25/2014  . Hyponatremia 04/25/2014  . Hematemesis with nausea 04/25/2014  . Lung nodule < 6cm on CT 04/25/2014  . Intractable nausea and vomiting 04/24/2014  . Gastroparesis   . Abdominal pain 04/01/2014  . Chronic abdominal pain 01/21/2014  . Gastroenteritis 12/10/2013  . Chronic abdominal wound infection 08/16/2013  . MDD (major depressive disorder), recurrent episode, severe ([place]) 06/27/2013  . Wrist laceration 06/24/2013  .  Nausea with vomiting 05/07/2013  . Diarrhea 05/07/2013  . Rectal bleeding 05/07/2013  . Abnormal LFTs 05/07/2013  . Adrenal mass, left ([place]) 05/07/2013  . Abscess of abdominal wall 04/19/2013  . Abdominal wall abscess 04/18/2013  . Frequent headaches 03/30/2013  . Sleep difficulties 03/30/2013  . Essential hypertension, benign 03/30/2013  . History of cocaine abuse 03/16/2013  . History of schizoaffective disorder 03/16/2013  . Bipolar disorder ([place]) 03/16/2013  . Personality disorder 03/16/2013  . Tobacco abuse 03/16/2013  . Alcohol abuse 03/16/2013  . Palpitations 03/16/2013  . Poor vision 03/16/2013  . History of gastric bypass 03/16/2013  . Status post hysterectomy with  oophorectomy 03/16/2013  . Hypothyroid 03/16/2013  . Lupus ([place]) 03/16/2013    Past Surgical History:  Procedure Laterality Date  . ABDOMINAL HYSTERECTOMY  2013   [place]  . ABDOMINAL SURGERY    . adrenal turmor removal    . AGILE CAPSULE N/A 01/05/2015   Procedure: AGILE CAPSULE;  Surgeon: Daneil Dolin, MD;  Location: [facility];  Service: Endoscopy;  Laterality: N/A;  0700  . Billroth II procedure      [place], first 2000, 2005/2006.  Marland Kitchen BIOPSY  05/20/2013   Procedure: BIOPSIES OF ASCENDING AND SIGMOID COLON;  Surgeon: Daneil Dolin, MD;  Location: [facility] ORS;  Service: Endoscopy;;  . BIOPSY  04/26/2014   Procedure: BIOPSIES;  Surgeon: Danie Binder, MD;  Location: [facility] OR[facility];  Service: Endoscopy;;  . CHOLECYSTECTOMY    . COLONOSCOPY     in [place]  . COLONOSCOPY WITH PROPOFOL N/A 05/20/2013   Dr.Rourk- inadequate prep, normal appearing rectum, grossly normal colon aside from pancolonic diverticula, normal terminal ileum bx= unremarkable colonic mucosa. Due for early interval 2016.   Marland Kitchen COLONOSCOPY WITH PROPOFOL N/A 04/26/2014   [id number] ileum/one colon polyp removed/moderate pan-colonic diverticulosis/large internal hemorrhoids  . COLONOSCOPY WITH PROPOFOL N/A 12/23/2014   Dr.Rourk- minimal internal hemorrhoids, pancolonic diverticulosis  . DEBRIDEMENT OF ABDOMINAL WALL ABSCESS N/A 02/08/2013   Procedure: DEBRIDEMENT OF ABDOMINAL WALL ABSCESS;  Surgeon: Jamesetta So, MD;  Location: [facility];  Service: General;  Laterality: N/A;  . ESOPHAGOGASTRODUODENOSCOPY (EGD) WITH PROPOFOL N/A 05/20/2013   Dr.Rourk- s/p prior gastric surgery with normal esophagus, residual gastric mucosa and patent efferent limb  . ESOPHAGOGASTRODUODENOSCOPY (EGD) WITH PROPOFOL N/A 02/03/2014   Dr. Gala Romney:  s/p hemigastrectomy with retained gastric contents. Residual gastric mucosa and efferent limb appeared normal otherwise. Query gastroparesis.   Marland Kitchen ESOPHAGOGASTRODUODENOSCOPY (EGD) WITH PROPOFOL N/A 04/26/2014    [id number] ring/small HH/mild non-erosive gasrtitis/normal anastomosis  . ESOPHAGOGASTRODUODENOSCOPY (EGD) WITH PROPOFOL N/A 12/23/2014   Dr.Rourk- s/p prior hemigastrctomy, active oozing from anastomotic suture site, hemostasis achieved  . tendon repar Right    wrist  . WOUND EXPLORATION Right 06/24/2013   Procedure: exploration of traumatic wound right wrist;  Surgeon: Tennis Must, MD;  Location: [facility];  Service: Orthopedics;  Laterality: Right;    OB History    Gravida Para Term Preterm AB Living   4 4 4     3    SAB TAB Ectopic Multiple Live Births                   Home Medications    Prior to Admission medications   Medication Sig Start Date End Date Taking? Authorizing Provider  folic acid (FOLVITE) 1 MG tablet Take 1 tablet (1 mg total) by mouth daily. 10/08/15  Yes Rosita Fire, MD  lisinopril (PRINIVIL,ZESTRIL) 10 MG tablet Take 10 mg by  mouth daily.   Yes Historical Provider, MD  pantoprazole (PROTONIX) 40 MG tablet Take 1 tablet (40 mg total) by mouth 2 (two) times daily before a meal. Patient taking differently: Take 40 mg by mouth daily.  09/04/14  Yes Daleen Bo, MD  promethazine (PHENERGAN) 25 MG tablet Take 1 tablet (25 mg total) by mouth every 6 (six) hours as needed for nausea or vomiting. 04/20/16  Yes Forde Dandy, MD  sucralfate (CARAFATE) 1 G tablet Take 1 g by mouth 4 (four) times daily.   Yes Historical Provider, MD  doxycycline (VIBRAMYCIN) 100 MG capsule Take 1 capsule (100 mg total) by mouth 2 (two) times daily. 01/02/16   [name], MD  Multiple Vitamin (MULTIVITAMIN WITH MINERALS) TABS tablet Take 1 tablet by mouth daily. 10/08/15   Rosita Fire, MD  polyethylene glycol (MIRALAX) packet Take 17 g by mouth daily. 04/25/16   Evalee Jefferson, PA-C  promethazine (PHENERGAN) 25 MG suppository Place 1 suppository (25 mg total) rectally every 6 (six) hours as needed for nausea or vomiting. 04/25/16   Evalee Jefferson, PA-C  thiamine 100 MG tablet Take 1 tablet (100 mg  total) by mouth daily. 10/08/15   Rosita Fire, MD    Family History Family History  Problem Relation Age of Onset  . Brain cancer Son   . Schizophrenia Son   . Cancer Son     brain  . Lung cancer Father   . Cancer Father     mets  . Drug abuse Mother   . Breast cancer Maternal Aunt   . Bipolar disorder Maternal Aunt   . Drug abuse Maternal Aunt   . Colon cancer Maternal Grandmother     late 50s, early 47s  . Drug abuse Sister   . Drug abuse Brother   . Bipolar disorder Paternal Grandfather   . Bipolar disorder Cousin   . Liver disease Neg Hx     Social History Social History  Substance Use Topics  . Smoking status: Current Every Day Smoker    Packs/day: 0.50    Years: 35.00    Types: Cigarettes  . Smokeless tobacco: Never Used  . Alcohol use 7.2 oz/week    12 Cans of beer per week     Comment: 5 12 oz beers a day     Allergies   Codeine and Morphine and related   Review of Systems Review of Systems  Constitutional: Negative for fever.  HENT: Negative for congestion and sore throat.   Eyes: Negative.   Respiratory: Negative for chest tightness and shortness of breath.   Cardiovascular: Negative for chest pain.  Gastrointestinal: Positive for abdominal pain, nausea and vomiting. Negative for constipation.  Genitourinary: Negative.   Musculoskeletal: Negative for arthralgias, joint swelling and neck pain.  Skin: Negative.  Negative for rash and wound.  Neurological: Positive for weakness. Negative for dizziness, light-headedness, numbness and headaches.  Psychiatric/Behavioral: Negative.      Physical Exam Updated Vital Signs BP 108/84   Pulse 87   Temp 98.4 F (36.9 C) (Oral)   Resp 18   Ht 5\' 3"  (1.6 m)   Wt 58.5 kg   SpO2 98%   BMI 22.85 kg/m   Physical Exam  Constitutional: She appears well-developed and well-nourished.  HENT:  Head: Normocephalic and atraumatic.  Eyes: Conjunctivae are normal.  Neck: Normal range of motion.    Cardiovascular: Normal rate, regular rhythm, normal heart sounds and intact distal pulses.   Pulmonary/Chest: Effort normal and breath  sounds normal. She has no wheezes.  Abdominal: Soft. Bowel sounds are normal. There is no hepatosplenomegaly. There is tenderness. There is guarding.  Guarding LUQ and epigastric region, but distractible.  No rebound.  No distention or increased tympany.  Multiple well healed abdominal surgical scars.  Musculoskeletal: Normal range of motion.  Neurological: She is alert.  Skin: Skin is warm and dry.  Psychiatric: She has a normal mood and affect.  Nursing note and vitals reviewed.    ED Treatments / Results  Labs (all labs ordered are listed, but only abnormal results are displayed) Labs Reviewed  COMPREHENSIVE METABOLIC PANEL - Abnormal; Notable for the following:       Result Value   Sodium 134 (*)    Glucose, Bld 101 (*)    Albumin 3.4 (*)    AST 77 (*)    All other components within normal limits  CBC WITH DIFFERENTIAL/PLATELET - Abnormal; Notable for the following:    RBC 3.62 (*)    Hemoglobin 10.8 (*)    HCT 30.6 (*)    All other components within normal limits  URINALYSIS, COMPLETE (UACMP) WITH MICROSCOPIC - Abnormal; Notable for the following:    APPearance HAZY (*)    Leukocytes, UA LARGE (*)    Bacteria, UA RARE (*)    All other components within normal limits  LIPASE, BLOOD  ETHANOL    EKG  EKG Interpretation None       Radiology Ct Abdomen Pelvis W Contrast  Result Date: 04/25/2016 CLINICAL DATA:  Constipation for 1 week. Nausea and vomiting. History of hysterectomy, cholecystectomy, hiatal hernia, lupus, diverticulitis, Billroth 2. EXAM: CT ABDOMEN AND PELVIS WITH CONTRAST TECHNIQUE: Multidetector CT imaging of the abdomen and pelvis was performed using the standard protocol following bolus administration of intravenous contrast. CONTRAST:  131mL ISOVUE-300 IOPAMIDOL (ISOVUE-300) INJECTION 61% COMPARISON:  Abdominal  radiographs April 25, 2016 and CT abdomen and pelvis October 01, 2015 FINDINGS: LOWER CHEST: Lingular scarring and dependent atelectasis. Heart size is normal. No pericardial effusions. Status post fundoplication and gastrojejunostomy. HEPATOBILIARY: The liver is diffusely hypodense compatible with steatosis. Status post cholecystectomy. PANCREAS: Normal. SPLEEN: Normal. ADRENALS/URINARY TRACT: Kidneys are orthotopic, demonstrating symmetric enhancement. No nephrolithiasis, hydronephrosis or solid renal masses. The unopacified ureters are normal in course and caliber. Delayed imaging through the kidneys demonstrates symmetric prompt contrast excretion within the proximal urinary collecting system. Urinary bladder is partially distended and unremarkable. 2.1 cm benign LEFT adrenal adenoma (8 Hounsfield units). Status post apparent RIGHT adrenalectomy. STOMACH/BOWEL: RIGHT lower quadrant small bowel anastomosis with small bowel feces compatible with chronic stasis. Moderate retained large bowel stool without bowel obstruction. Small appendix versus residual stump. VASCULAR/LYMPHATIC: Aortoiliac vessels are normal in course and caliber, mild calcific atherosclerosis. No lymphadenopathy by CT size criteria. REPRODUCTIVE: Status post hysterectomy. OTHER: Trace free fluid in the pelvis, likely physiologic. No focal fluid collection or intraperitoneal free air. Phleboliths in the pelvis. MUSCULOSKELETAL: Unchanged of tear abdominal wall scarring. IMPRESSION: No acute intra-abdominal or pelvic process. Extensive postsurgical changes of the abdomen. Moderate large bowel stool without bowel obstruction. Electronically Signed   By: Elon Alas M.D.   On: 04/25/2016 14:43   Dg Abd Acute W/chest  Result Date: 04/25/2016 CLINICAL DATA:  Upper abdominal pain EXAM: DG ABDOMEN ACUTE W/ 1V CHEST COMPARISON:  04/19/2016 FINDINGS: Cardiac shadow is stable. Lungs are well aerated bilaterally. Mild scarring is noted in the right  mid lung better visualized than on the prior exam due to some projectional differences. The  abdomen shows a nonobstructive bowel gas pattern. Fecal material is noted throughout the colon but decreased from the prior study. Postsurgical changes are seen. No acute bony abnormality is noted. No free air is seen. IMPRESSION: Decrease in the degree of fecal material within the colon. No obstructive changes are seen. No acute abnormality is noted in the chest. Electronically Signed   By: Inez Catalina M.D.   On: 04/25/2016 12:15    Procedures Procedures (including critical care time)  Medications Ordered in ED Medications  sodium chloride 0.9 % bolus 1,000 mL (0 mLs Intravenous Stopped 04/25/16 1256)    And  0.9 %  sodium chloride infusion (125 mL/hr Intravenous New Bag/Given 04/25/16 1352)  ondansetron (ZOFRAN) injection 4 mg (4 mg Intravenous Given 04/25/16 1157)  fentaNYL (SUBLIMAZE) injection 50 mcg (50 mcg Intravenous Given 04/25/16 1156)  metoCLOPramide (REGLAN) injection 10 mg (10 mg Intravenous Given 04/25/16 1319)  diphenhydrAMINE (BENADRYL) injection 12.5 mg (12.5 mg Intravenous Given 04/25/16 1322)  iopamidol (ISOVUE-300) 61 % injection (30 mLs  Contrast Given 04/25/16 1303)  iopamidol (ISOVUE-300) 61 % injection 100 mL (100 mLs Intravenous Contrast Given 04/25/16 1414)     Initial Impression / Assessment and Plan / ED Course  I have reviewed the triage vital signs and the nursing notes.  Pertinent labs & imaging results that were available during my care of the patient were reviewed by me and considered in my medical decision making (see chart for details).     Pt was able to tolerate oral contrast but still was nauseated despite reglan IV.  She was prescribed phenergan suppositories if needed for inability to keep her po meds down.  Also prescribed miralax for constipation.  Advised f/u with pcp and/or GI.    Attempted to contact GI office but with no response.  Pt is stable, labs, CT without new  findings.  Pt in no distress at time of dc.  Final Clinical Impressions(s) / ED Diagnoses   Final diagnoses:  Pain of upper abdomen  Constipation, unspecified constipation type  Non-intractable vomiting with nausea, unspecified vomiting type    New Prescriptions New Prescriptions   POLYETHYLENE GLYCOL (MIRALAX) PACKET    Take 17 g by mouth daily.   PROMETHAZINE (PHENERGAN) 25 MG SUPPOSITORY    Place 1 suppository (25 mg total) rectally every 6 (six) hours as needed for nausea or vomiting.     Evalee Jefferson, PA-C 04/25/16 [place], MD 04/26/16 [id number]

## 2016-04-25 NOTE — Progress Notes (Signed)
Referring Provider: Rosita Fire, MD Primary Care Physician:  Rosita Fire, MD Primary GI: Dr. Gala Romney   Chief Complaint  Patient presents with  . Abdominal Pain    HPI:   Angel Price is a 51 y.o. 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 Multiple no-shows for hospital follow-up in our practice. Anemia of chronic disease. Up-to-date on EGD/colonoscopy from October 2016. Known prior history of Billroth II in remote past. Other pertinent medical history includes chronic abdominal pain, GERD, gastroparesis.   Recently in the ED 04/20/16. Lipase 13, ethanol level significantly elevated at 189, Hgb 12.5, AST 61 otherwise all LFTs normal. Abdominal xray 04/20/16 with non-obstructive bowel gas pattern and large amount of stool.   Was doing ok until Friday but then developed acute on chronic abdominal pain in epigastric region and radiating to her back. Associated intractable N/V. Tries sipping liquids but feels like she is drinking a gallon and "comes back up". Tried gatorade, powerade. Last overt vomiting this morning. States streaky blood in emesis like "little lines". Tried to drink alcohol yesterday but couldn't do it. Drank a beer or two on Thursday but came back up. Has been using Aleve, Advil. Some chills, no fever. During visit she is curled on the bed, heaving. Spitting up mucus. She has been unable to take her medications today.   Last CT on file from July 2017 with pancreatitis, possible duodenitis. No follow-up imaging as patient has not shown for office visits.   I reviewed the ED note from 04/20/16 which stated the lab tech had noted patient sticking her fingers down throat to induce vomiting.   Past Medical History:  Diagnosis Date  . Abscess    soft tissue  . Adrenal mass ([place])   . Alcohol abuse   . Anxiety   . Blood transfusion without reported diagnosis   . Chronic abdominal pain   . Chronic wound infection of  abdomen   . Colon polyp    colonoscopy 04/2014  . Depression   . Diverticulosis    colonoscopy 04/2014 moderat pan colonic  . Gastritis    EGD 05/2014  . Gastroparesis Nov 2015  . GERD (gastroesophageal reflux disease)   . Hemorrhoid    internal large  . Hiatal hernia   . History of Billroth II operation   . Hypertension   . Lung nodule    CT 02/2014 needs repeat 1 month  . Lung nodule < 6cm on CT 04/25/2014  . Lupus   . Nausea and vomiting    chronic, recurrent  . Schatzki's ring    patent per EGD 04/2014  . Sickle cell trait ([place])   . Suicide attempt   . 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 ABDOMINAL HYSTERECTOMY  2013   [place]  . ABDOMINAL SURGERY    . adrenal turmor removal    . AGILE CAPSULE N/A 01/05/2015   Procedure: AGILE CAPSULE;  Surgeon: Daneil Dolin, MD;  Location: [facility];  Service: Endoscopy;  Laterality: N/A;  0700  . Billroth II procedure      [place], first 2000, 2005/2006.  Marland Kitchen BIOPSY  05/20/2013   Procedure: BIOPSIES OF ASCENDING AND SIGMOID COLON;  Surgeon: Daneil Dolin, MD;  Location: [facility];  Service: Endoscopy;;  . BIOPSY  04/26/2014   Procedure: BIOPSIES;  Surgeon: Danie Binder, MD;  Location: [facility];  Service: Endoscopy;;  . CHOLECYSTECTOMY    . COLONOSCOPY     in [place]  . COLONOSCOPY WITH PROPOFOL N/A 05/20/2013   Dr.Rourk- inadequate prep, normal appearing rectum, grossly normal colon aside from pancolonic diverticula, normal terminal ileum bx= unremarkable colonic mucosa. Due for early interval 2016.   Marland Kitchen COLONOSCOPY WITH PROPOFOL N/A 04/26/2014   [id number] ileum/one colon polyp removed/moderate pan-colonic diverticulosis/large internal hemorrhoids  . COLONOSCOPY WITH PROPOFOL N/A 12/23/2014   Dr.Rourk- minimal internal hemorrhoids, pancolonic diverticulosis  . DEBRIDEMENT OF ABDOMINAL WALL ABSCESS N/A 02/08/2013   Procedure: DEBRIDEMENT OF ABDOMINAL WALL ABSCESS;   Surgeon: Jamesetta So, MD;  Location: [facility];  Service: General;  Laterality: N/A;  . ESOPHAGOGASTRODUODENOSCOPY (EGD) WITH PROPOFOL N/A 05/20/2013   Dr.Rourk- s/p prior gastric surgery with normal esophagus, residual gastric mucosa and patent efferent limb  . ESOPHAGOGASTRODUODENOSCOPY (EGD) WITH PROPOFOL N/A 02/03/2014   Dr. Gala Romney:  s/p hemigastrectomy with retained gastric contents. Residual gastric mucosa and efferent limb appeared normal otherwise. Query gastroparesis.   Marland Kitchen ESOPHAGOGASTRODUODENOSCOPY (EGD) WITH PROPOFOL N/A 04/26/2014   [id number] ring/small HH/mild non-erosive gasrtitis/normal anastomosis  . ESOPHAGOGASTRODUODENOSCOPY (EGD) WITH PROPOFOL N/A 12/23/2014   Dr.Rourk- s/p prior hemigastrctomy, active oozing from anastomotic suture site, hemostasis achieved  . tendon repar Right    wrist  . WOUND EXPLORATION Right 06/24/2013   Procedure: exploration of traumatic wound right wrist;  Surgeon: Tennis Must, MD;  Location: [place];  Service: Orthopedics;  Laterality: Right;    Current Outpatient Prescriptions  Medication Sig Dispense Refill  . doxycycline (VIBRAMYCIN) 100 MG capsule Take 1 capsule (100 mg total) by mouth 2 (two) times daily. 20 capsule 0  . fluticasone (FLONASE) 50 MCG/ACT nasal spray Place 1 spray into both nostrils daily.    . folic acid (FOLVITE) 1 MG tablet Take 1 tablet (1 mg total) by mouth daily. 30 tablet 3  . lisinopril (PRINIVIL,ZESTRIL) 10 MG tablet Take 10 mg by mouth daily.    . Multiple Vitamin (MULTIVITAMIN WITH MINERALS) TABS tablet Take 1 tablet by mouth daily. 30 tablet 3  . pantoprazole (PROTONIX) 40 MG tablet Take 1 tablet (40 mg total) by mouth 2 (two) times daily before a meal. (Patient taking differently: Take 40 mg by mouth daily. ) 60 tablet 0  . promethazine (PHENERGAN) 25 MG tablet Take 1 tablet (25 mg total) by mouth every 6 (six) hours as needed for nausea or vomiting. 30 tablet 0  . sucralfate (CARAFATE) 1 G tablet Take 1 g by mouth 4  (four) times daily.    Marland Kitchen thiamine 100 MG tablet Take 1 tablet (100 mg total) by mouth daily. 30 tablet 0  . traMADol (ULTRAM) 50 MG tablet Take 1 tablet (50 mg total) by mouth 3 (three) times daily as needed. 9 tablet 0   No current facility-administered medications for this visit.     Allergies as of 04/25/2016 - Review Complete 04/25/2016  Allergen Reaction Noted  . Codeine Hives 02/06/2013  . Morphine and related Hives 02/06/2013    Family History  Problem Relation Age of Onset  . Brain cancer Son   . Schizophrenia Son   . Cancer Son     brain  . Lung cancer Father   . Cancer Father     mets  . Drug abuse Mother   . Breast cancer Maternal Aunt   . Bipolar disorder Maternal Aunt   . Drug abuse Maternal Aunt   . Colon cancer Maternal Grandmother     late  100s, early 102s  . Drug abuse Sister   . Drug abuse Brother   . Bipolar disorder Paternal Grandfather   . Bipolar disorder Cousin   . Liver disease Neg Hx     Social History   Social History  . Marital status: Legally Separated    Spouse name: N/A  . Number of children: N/A  . Years of education: N/A   Social History Main Topics  . Smoking status: Current Every Day Smoker    Packs/day: 0.50    Years: 35.00    Types: Cigarettes  . Smokeless tobacco: Never Used  . Alcohol use 7.2 oz/week    12 Cans of beer per week     Comment: 5 12 oz beers a day  . Drug use: No     Comment: quit 09/2012  . Sexual activity: Yes    Birth control/ protection: Surgical   Other Topics Concern  . None   Social History Narrative  . None    Review of Systems: As mentioned in HPI   Physical Exam: BP 116/86   Pulse (!) 121   Temp 98.4 F (36.9 C) (Oral)   Ht 5\' 2"  (1.575 m)   Wt 132 lb 3.2 oz (60 kg)   BMI 24.18 kg/m  General:   Acutely ill, obviously uncomfortable, laying on bed. Heaving during visit.  Head:  Normocephalic and atraumatic. Eyes:  Conjuctiva clear without scleral icterus. Mouth:  Oral mucosa pink and  moist.  Heart:  S1, S2 present, tachycardic  Abdomen:  +BS, significant LUQ/epigastric TTP moreso than lower abdomen. Flinches with exam, somewhat out of proportion to exam.  Msk:  Symmetrical without gross deformities. Normal posture. Extremities:  Without edema. Neurologic:  Alert and  oriented x4 Psych:  Alert and cooperative. Flat affect.

## 2016-04-26 NOTE — Progress Notes (Signed)
cc'ed to pcp °

## 2016-05-13 IMAGING — DX DG ABDOMEN 1V
1 series · 1 of 1 positions shown · non-contrast
Comparison: CT abdomen pelvis 12/22/2014

CLINICAL DATA: Rectal bleeding.  Capsule endoscopy yesterday.

EXAM:
ABDOMEN - 1 VIEW

[abdomen kub]
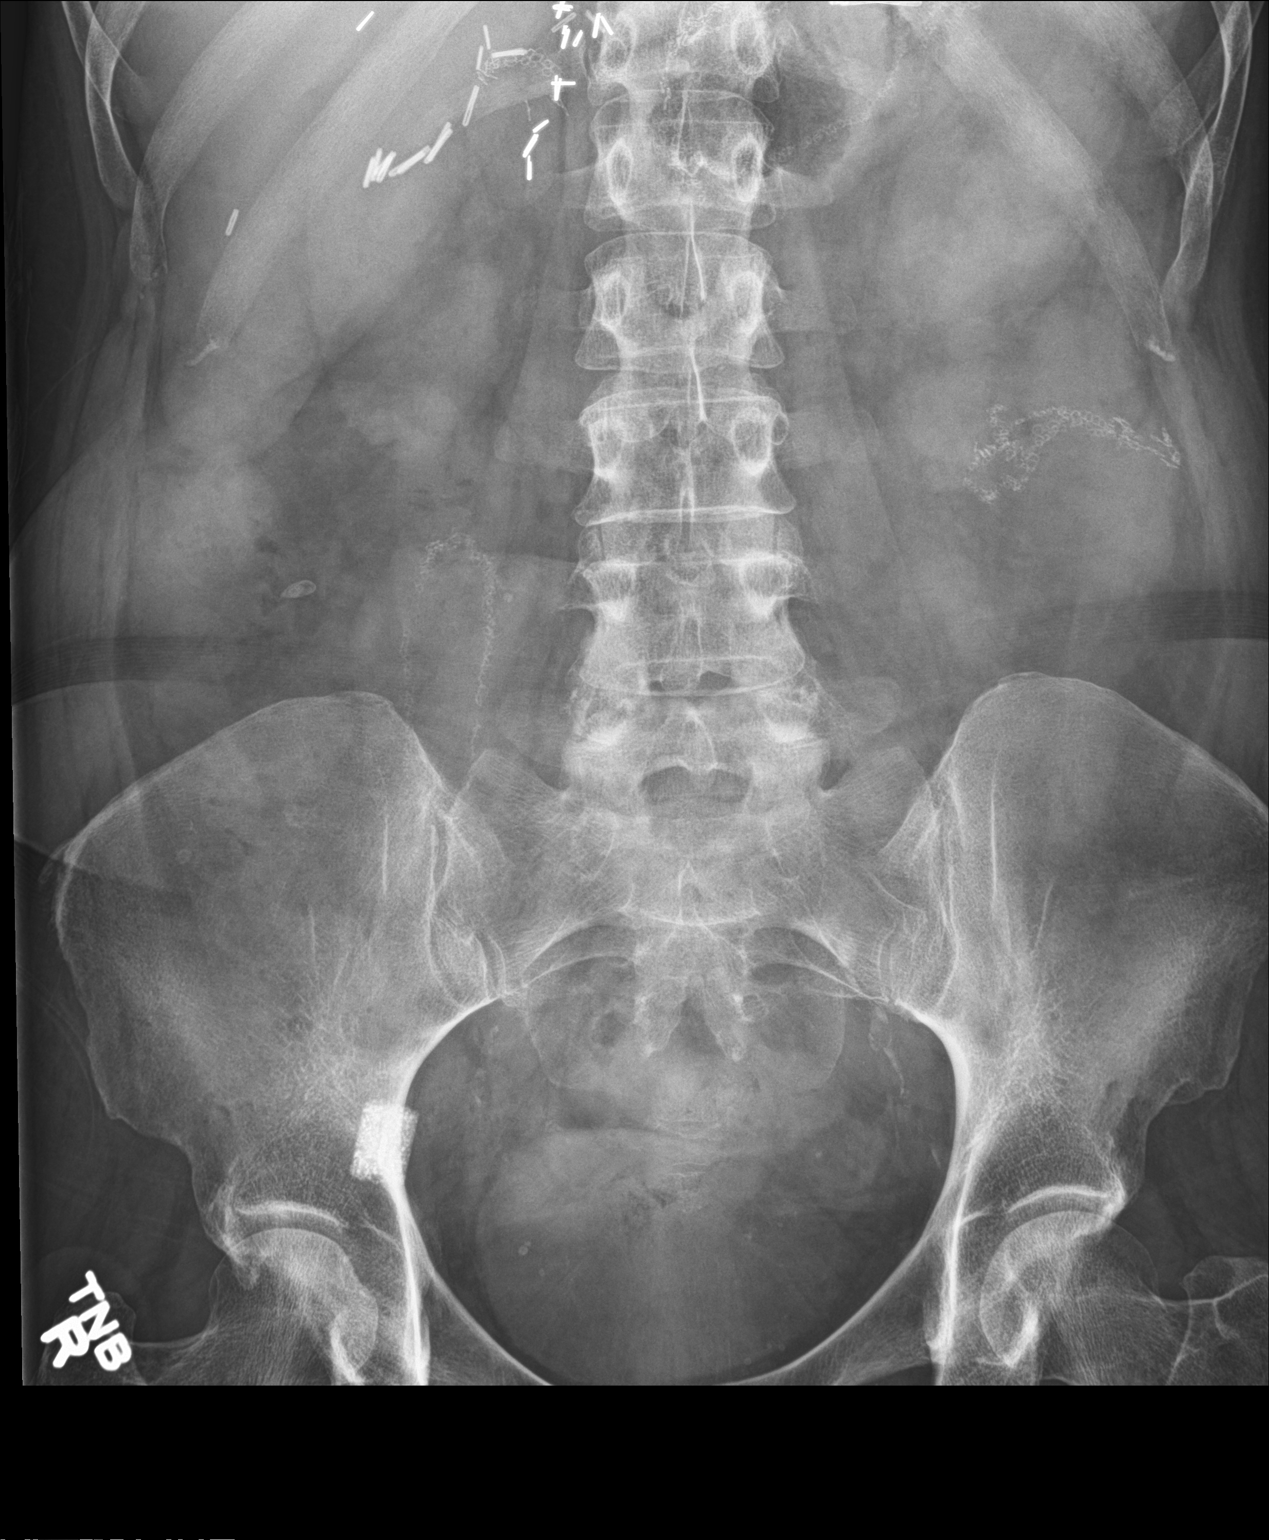

[1 of 1 positions shown; findings below may reference images not displayed]

FINDINGS: Cylindrical density measuring 10 x 20 mm in the right lower
quadrant. This could represent endoscopy capsule but does not have a
typical appearance. Correlate with ingested capsule.

Negative for bowel obstruction. Bowel sutures around the stomach.
Surgical clips around the right kidney and gallbladder fossa. Bowel
clips in the region of the right colon. No renal calculi
IMPRESSION: Negative for bowel obstruction

Foreign body overlying the right lower quadrant, possibly endoscopy
capsule although not typical in appearance.

## 2016-05-16 ENCOUNTER — Other Ambulatory Visit: Payer: Self-pay | Admitting: Gastroenterology

## 2016-05-22 ENCOUNTER — Emergency Department (HOSPITAL_COMMUNITY): Payer: Medicare HMO

## 2016-05-22 ENCOUNTER — Encounter (HOSPITAL_COMMUNITY): Payer: Self-pay | Admitting: *Deleted

## 2016-05-22 ENCOUNTER — Inpatient Hospital Stay (HOSPITAL_COMMUNITY)
Admission: EM | Admit: 2016-05-22 | Discharge: 2016-05-26 | DRG: 378 | Disposition: A | Discharge status: Home / Self Care | Payer: Medicare HMO | Attending: Internal Medicine | Admitting: Internal Medicine

## 2016-05-22 DIAGNOSIS — K3182 Dieulafoy lesion (hemorrhagic) of stomach and duodenum: Principal | ICD-10-CM | POA: Diagnosis present

## 2016-05-22 DIAGNOSIS — F609 Personality disorder, unspecified: Secondary | ICD-10-CM | POA: Diagnosis present

## 2016-05-22 DIAGNOSIS — L02211 Cutaneous abscess of abdominal wall: Secondary | ICD-10-CM | POA: Diagnosis not present

## 2016-05-22 DIAGNOSIS — F17200 Nicotine dependence, unspecified, uncomplicated: Secondary | ICD-10-CM | POA: Diagnosis present

## 2016-05-22 DIAGNOSIS — Z8659 Personal history of other mental and behavioral disorders: Secondary | ICD-10-CM

## 2016-05-22 DIAGNOSIS — F1411 Cocaine abuse, in remission: Secondary | ICD-10-CM

## 2016-05-22 DIAGNOSIS — K2901 Acute gastritis with bleeding: Secondary | ICD-10-CM

## 2016-05-22 DIAGNOSIS — F1010 Alcohol abuse, uncomplicated: Secondary | ICD-10-CM | POA: Diagnosis present

## 2016-05-22 DIAGNOSIS — R69 Illness, unspecified: Secondary | ICD-10-CM | POA: Diagnosis not present

## 2016-05-22 DIAGNOSIS — R079 Chest pain, unspecified: Secondary | ICD-10-CM | POA: Diagnosis not present

## 2016-05-22 DIAGNOSIS — T8149XA Infection following a procedure, other surgical site, initial encounter: Secondary | ICD-10-CM

## 2016-05-22 DIAGNOSIS — F419 Anxiety disorder, unspecified: Secondary | ICD-10-CM | POA: Diagnosis present

## 2016-05-22 DIAGNOSIS — K3184 Gastroparesis: Secondary | ICD-10-CM | POA: Diagnosis present

## 2016-05-22 DIAGNOSIS — I10 Essential (primary) hypertension: Secondary | ICD-10-CM | POA: Diagnosis present

## 2016-05-22 DIAGNOSIS — D573 Sickle-cell trait: Secondary | ICD-10-CM | POA: Diagnosis present

## 2016-05-22 DIAGNOSIS — K921 Melena: Secondary | ICD-10-CM | POA: Diagnosis not present

## 2016-05-22 DIAGNOSIS — E871 Hypo-osmolality and hyponatremia: Secondary | ICD-10-CM | POA: Diagnosis present

## 2016-05-22 DIAGNOSIS — R109 Unspecified abdominal pain: Secondary | ICD-10-CM | POA: Diagnosis not present

## 2016-05-22 DIAGNOSIS — Z720 Tobacco use: Secondary | ICD-10-CM | POA: Diagnosis present

## 2016-05-22 DIAGNOSIS — F319 Bipolar disorder, unspecified: Secondary | ICD-10-CM | POA: Diagnosis present

## 2016-05-22 DIAGNOSIS — Z980 Intestinal bypass and anastomosis status: Secondary | ICD-10-CM

## 2016-05-22 DIAGNOSIS — Z79899 Other long term (current) drug therapy: Secondary | ICD-10-CM

## 2016-05-22 DIAGNOSIS — F17210 Nicotine dependence, cigarettes, uncomplicated: Secondary | ICD-10-CM | POA: Diagnosis present

## 2016-05-22 DIAGNOSIS — K219 Gastro-esophageal reflux disease without esophagitis: Secondary | ICD-10-CM | POA: Diagnosis present

## 2016-05-22 DIAGNOSIS — K922 Gastrointestinal hemorrhage, unspecified: Secondary | ICD-10-CM | POA: Diagnosis not present

## 2016-05-22 DIAGNOSIS — L03311 Cellulitis of abdominal wall: Secondary | ICD-10-CM | POA: Diagnosis not present

## 2016-05-22 DIAGNOSIS — D638 Anemia in other chronic diseases classified elsewhere: Secondary | ICD-10-CM | POA: Diagnosis present

## 2016-05-22 DIAGNOSIS — F1410 Cocaine abuse, uncomplicated: Secondary | ICD-10-CM | POA: Diagnosis present

## 2016-05-22 DIAGNOSIS — F259 Schizoaffective disorder, unspecified: Secondary | ICD-10-CM | POA: Diagnosis present

## 2016-05-22 LAB — CBC
HCT: 31.6 % — ABNORMAL LOW (ref 36.0–46.0)
HEMOGLOBIN: 11.1 g/dL — AB (ref 12.0–15.0)
MCH: 29.9 pg (ref 26.0–34.0)
MCHC: 35.1 g/dL (ref 30.0–36.0)
MCV: 85.2 fL (ref 78.0–100.0)
Platelets: 155 10*3/uL (ref 150–400)
RBC: 3.71 MIL/uL — ABNORMAL LOW (ref 3.87–5.11)
RDW: 14.4 % (ref 11.5–15.5)
WBC: 4.3 10*3/uL (ref 4.0–10.5)

## 2016-05-22 LAB — RAPID URINE DRUG SCREEN, HOSP PERFORMED
Amphetamines: NOT DETECTED
BARBITURATES: NOT DETECTED
Benzodiazepines: NOT DETECTED
Cocaine: NOT DETECTED
Opiates: NOT DETECTED
Tetrahydrocannabinol: NOT DETECTED

## 2016-05-22 LAB — LIPASE, BLOOD

## 2016-05-22 LAB — COMPREHENSIVE METABOLIC PANEL
ALT: 30 U/L (ref 14–54)
AST: 90 U/L — AB (ref 15–41)
Albumin: 3.6 g/dL (ref 3.5–5.0)
Alkaline Phosphatase: 131 U/L — ABNORMAL HIGH (ref 38–126)
Anion gap: 13 (ref 5–15)
BILIRUBIN TOTAL: 1 mg/dL (ref 0.3–1.2)
CO2: 19 mmol/L — ABNORMAL LOW (ref 22–32)
Calcium: 8.8 mg/dL — ABNORMAL LOW (ref 8.9–10.3)
Chloride: 94 mmol/L — ABNORMAL LOW (ref 101–111)
Creatinine, Ser: 0.57 mg/dL (ref 0.44–1.00)
GFR calc Af Amer: >60 mL/min (ref >60)
Glucose, Bld: 70 mg/dL (ref 65–99)
POTASSIUM: 4.4 mmol/L (ref 3.5–5.1)
Sodium: 126 mmol/L — ABNORMAL LOW (ref 135–145)
Total Protein: 7.4 g/dL (ref 6.5–8.1)

## 2016-05-22 LAB — CBC WITH DIFFERENTIAL/PLATELET
BASOS ABS: 0 10*3/uL (ref 0.0–0.1)
Basophils Relative: 0 %
Eosinophils Absolute: 0 10*3/uL (ref 0.0–0.7)
Eosinophils Relative: 0 %
HCT: 32.4 % — ABNORMAL LOW (ref 36.0–46.0)
Hemoglobin: 11.2 g/dL — ABNORMAL LOW (ref 12.0–15.0)
LYMPHS PCT: 37 %
Lymphs Abs: 1.7 10*3/uL (ref 0.7–4.0)
MCH: 29.3 pg (ref 26.0–34.0)
MCHC: 34.6 g/dL (ref 30.0–36.0)
MCV: 84.8 fL (ref 78.0–100.0)
Monocytes Absolute: 0.4 10*3/uL (ref 0.1–1.0)
Monocytes Relative: 10 %
NEUTROS ABS: 2.4 10*3/uL (ref 1.7–7.7)
Neutrophils Relative %: 53 %
PLATELETS: 184 10*3/uL (ref 150–400)
RBC: 3.82 MIL/uL — AB (ref 3.87–5.11)
RDW: 14.3 % (ref 11.5–15.5)
WBC: 4.5 10*3/uL (ref 4.0–10.5)

## 2016-05-22 LAB — PREPARE RBC (CROSSMATCH)

## 2016-05-22 LAB — TROPONIN I: Troponin I: <0.03 ng/mL (ref <0.03)

## 2016-05-22 LAB — POC OCCULT BLOOD, ED: Fecal Occult Bld: POSITIVE — AB

## 2016-05-22 LAB — ETHANOL: Alcohol, Ethyl (B): 17 mg/dL — ABNORMAL HIGH (ref <5)

## 2016-05-22 LAB — MRSA PCR SCREENING: MRSA by PCR: NEGATIVE

## 2016-05-22 MED ORDER — DIPHENHYDRAMINE HCL 25 MG PO CAPS
50.0000 mg | ORAL_CAPSULE | Freq: Once | ORAL | Status: AC
  Administered 2016-05-22: 50 mg via ORAL
  Filled 2016-05-22: qty 2

## 2016-05-22 MED ORDER — SODIUM CHLORIDE 0.9 % IV BOLUS (SEPSIS)
500.0000 mL | Freq: Once | INTRAVENOUS | Status: AC
  Administered 2016-05-22: 500 mL via INTRAVENOUS

## 2016-05-22 MED ORDER — MORPHINE SULFATE (PF) 2 MG/ML IV SOLN
1.0000 mg | INTRAVENOUS | Status: DC | PRN
  Administered 2016-05-22: 1 mg via INTRAVENOUS
  Filled 2016-05-22 (×2): qty 1

## 2016-05-22 MED ORDER — SODIUM CHLORIDE 0.9 % IV SOLN: Freq: Once | INTRAVENOUS | Status: DC

## 2016-05-22 MED ORDER — ADULT MULTIVITAMIN W/MINERALS CH
1.0000 | ORAL_TABLET | Freq: Every day | ORAL | Status: DC
  Administered 2016-05-22 – 2016-05-26 (×4): 1 via ORAL
  Filled 2016-05-22 (×5): qty 1

## 2016-05-22 MED ORDER — ONDANSETRON HCL 4 MG/2ML IJ SOLN
4.0000 mg | Freq: Once | INTRAMUSCULAR | Status: AC
  Administered 2016-05-22: 4 mg via INTRAVENOUS
  Filled 2016-05-22: qty 2

## 2016-05-22 MED ORDER — VITAMIN B-1 100 MG PO TABS
100.0000 mg | ORAL_TABLET | Freq: Every day | ORAL | Status: DC
  Administered 2016-05-22 – 2016-05-26 (×4): 100 mg via ORAL
  Filled 2016-05-22 (×5): qty 1

## 2016-05-22 MED ORDER — ONDANSETRON HCL 4 MG PO TABS
4.0000 mg | ORAL_TABLET | Freq: Four times a day (QID) | ORAL | Status: DC | PRN
  Filled 2016-05-22: qty 1

## 2016-05-22 MED ORDER — ONDANSETRON HCL 4 MG/2ML IJ SOLN
4.0000 mg | Freq: Four times a day (QID) | INTRAMUSCULAR | Status: DC | PRN
  Administered 2016-05-22 – 2016-05-24 (×4): 4 mg via INTRAVENOUS
  Filled 2016-05-22 (×4): qty 2

## 2016-05-22 MED ORDER — IOPAMIDOL (ISOVUE-300) INJECTION 61%
INTRAVENOUS | Status: AC
  Administered 2016-05-22: 30 mL via ORAL
  Filled 2016-05-22: qty 30

## 2016-05-22 MED ORDER — SODIUM CHLORIDE 0.9 % IV SOLN
INTRAVENOUS | Status: DC
  Administered 2016-05-22 – 2016-05-25 (×7): via INTRAVENOUS

## 2016-05-22 MED ORDER — FOLIC ACID 1 MG PO TABS
1.0000 mg | ORAL_TABLET | Freq: Every day | ORAL | Status: DC
  Administered 2016-05-22 – 2016-05-26 (×4): 1 mg via ORAL
  Filled 2016-05-22 (×5): qty 1

## 2016-05-22 MED ORDER — SODIUM CHLORIDE 0.9 % IV SOLN: INTRAVENOUS | Status: DC

## 2016-05-22 MED ORDER — IOPAMIDOL (ISOVUE-300) INJECTION 61%
100.0000 mL | Freq: Once | INTRAVENOUS | Status: AC | PRN
  Administered 2016-05-22: 100 mL via INTRAVENOUS

## 2016-05-22 MED ORDER — HYDROMORPHONE HCL 1 MG/ML IJ SOLN
1.0000 mg | Freq: Once | INTRAMUSCULAR | Status: AC
  Administered 2016-05-22: 1 mg via INTRAVENOUS
  Filled 2016-05-22: qty 1

## 2016-05-22 MED ORDER — KETOROLAC TROMETHAMINE 30 MG/ML IJ SOLN
30.0000 mg | Freq: Once | INTRAMUSCULAR | Status: AC
  Administered 2016-05-22: 30 mg via INTRAVENOUS
  Filled 2016-05-22: qty 1

## 2016-05-22 MED ORDER — SODIUM CHLORIDE 0.9 % IV SOLN
INTRAVENOUS | Status: DC
  Administered 2016-05-22: 16:00:00 via INTRAVENOUS

## 2016-05-22 MED ORDER — PANTOPRAZOLE SODIUM 40 MG IV SOLR
40.0000 mg | Freq: Two times a day (BID) | INTRAVENOUS | Status: DC
  Administered 2016-05-22 – 2016-05-24 (×4): 40 mg via INTRAVENOUS
  Filled 2016-05-22 (×4): qty 40

## 2016-05-22 MED ORDER — OXYCODONE-ACETAMINOPHEN 5-325 MG PO TABS
2.0000 | ORAL_TABLET | Freq: Once | ORAL | Status: AC
  Administered 2016-05-22: 2 via ORAL
  Filled 2016-05-22: qty 2

## 2016-05-22 MED ORDER — BOOST / RESOURCE BREEZE PO LIQD
1.0000 | Freq: Three times a day (TID) | ORAL | Status: DC
  Administered 2016-05-22 – 2016-05-25 (×5): 1 via ORAL

## 2016-05-22 MED ORDER — FENTANYL CITRATE (PF) 100 MCG/2ML IJ SOLN
25.0000 ug | INTRAMUSCULAR | Status: DC | PRN
  Administered 2016-05-23 (×3): 25 ug via INTRAVENOUS
  Administered 2016-05-24 – 2016-05-26 (×10): 50 ug via INTRAVENOUS
  Filled 2016-05-22 (×12): qty 2

## 2016-05-22 NOTE — ED Triage Notes (Signed)
Pt c/o black stool with burgundy colored blot clots from rectum, nausea, vomiting x 2, epigastric pain, LLQ abdominal cramping that started 2 days ago. Pt states "I haven't been drinking that much over the last 4 days". Pt reports last alcohol use was yesterday.

## 2016-05-22 NOTE — Progress Notes (Signed)
Pt up to floor from the ED.  Paged Dr Jerilee Hoh via Shea Evans to notify of arrival.  Dr Jerilee Hoh admitting for Dr Legrand Rams.

## 2016-05-22 NOTE — ED Notes (Signed)
Attempted report x1, states bed has not been approved at this time.

## 2016-05-22 NOTE — ED Provider Notes (Signed)
[facility] DEPT Provider Note   CSN: [id number] Arrival date & time: 05/22/16  1012  By signing my name below, I, Neta Mends, attest that this documentation has been prepared under the direction and in the presence of Fredia Sorrow, MD . Electronically Signed: Neta Mends, ED Scribe. 05/22/2016. 11:18 AM.    History   Chief Complaint Chief Complaint  Patient presents with  . Rectal Bleeding   The history is provided by the patient. No language interpreter was used.   HPI Comments:  Angel Price is a 51 y.o. female who presents to the Emergency Department complaining of multiple episodes of vomiting x 4 days. She reports 10-15 episodes of vomiting, and 3 BMs. Pt complains of associated abdominal pain x 4 days, and blood in stool x 1 days, fever, blurred vision, cough, SOB, chest pain, back pain, rash on the left ankle, dizziness, and headaches. Pt states that her stool has contained blood clots and the toilet water turned red, and this this happened several times today. Pt reports hx of rectal bleeding. No alleviating factors noted. Pt denies chills, rhinorrhea, sore throat, dysuria, hematuria, joint swelling, and bleeding easily.   Past Medical History:  Diagnosis Date  . Abscess    soft tissue  . Adrenal mass ([place])   . Alcohol abuse   . Anxiety   . Blood transfusion without reported diagnosis   . Chronic abdominal pain   . Chronic wound infection of abdomen   . Colon polyp    colonoscopy 04/2014  . Depression   . Diverticulosis    colonoscopy 04/2014 moderat pan colonic  . Gastritis    EGD 05/2014  . Gastroparesis Nov 2015  . GERD (gastroesophageal reflux disease)   . Hemorrhoid    internal large  . Hiatal hernia   . History of Billroth II operation   . Hypertension   . Lung nodule    CT 02/2014 needs repeat 1 month  . Lung nodule < 6cm on CT 04/25/2014  . Lupus   . Nausea and vomiting    chronic, recurrent  . Schatzki's ring    patent per EGD  04/2014  . Sickle cell trait ([place])   . Suicide attempt   . 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 Absolute anemia   . Pancreatitis, acute   . Acute pancreatitis 10/01/2015  . Abnormal CT scan of lung 10/01/2015  . Alcohol intoxication ([place])   . Left-sided weakness   . Psychosomatic factor in physical condition   . Upper GI bleed   . Diverticulosis of colon with hemorrhage   . Chronic wound infection of abdomen 12/22/2014  . Thyroid disease 12/22/2014  . Hypertension 12/22/2014  . Ataxia 11/01/2014  . Hemorrhoids 04/26/2014  . Lung nodule 04/26/2014  . Diverticulosis   . Gastritis   . Hiatal hernia   . Schatzki's ring   . Acute blood loss anemia 04/25/2014  . Sinus tachycardia 04/25/2014  . Hypokalemia 04/25/2014  . Hyponatremia 04/25/2014  . Hematemesis with nausea 04/25/2014  . Lung nodule < 6cm on CT 04/25/2014  . Intractable nausea and vomiting 04/24/2014  . Gastroparesis   . Abdominal pain 04/01/2014  . Chronic abdominal pain 01/21/2014  . Gastroenteritis 12/10/2013  . Chronic abdominal wound infection 08/16/2013  . MDD (major depressive disorder), recurrent episode, severe ([place]) 06/27/2013  . Wrist laceration 06/24/2013  . Nausea with vomiting 05/07/2013  .  Diarrhea 05/07/2013  . Rectal bleeding 05/07/2013  . Abnormal LFTs 05/07/2013  . Adrenal mass, left ([place]) 05/07/2013  . Abscess of abdominal wall 04/19/2013  . Abdominal wall abscess 04/18/2013  . Frequent headaches 03/30/2013  . Sleep difficulties 03/30/2013  . Essential hypertension, benign 03/30/2013  . History of cocaine abuse 03/16/2013  . History of schizoaffective disorder 03/16/2013  . Bipolar disorder ([place]) 03/16/2013  . Personality disorder 03/16/2013  . Tobacco abuse 03/16/2013  . Alcohol abuse 03/16/2013  . Palpitations 03/16/2013  . Poor vision 03/16/2013  . History of gastric bypass 03/16/2013  . Status post hysterectomy  with oophorectomy 03/16/2013  . Hypothyroid 03/16/2013  . Lupus ([place]) 03/16/2013    Past Surgical History:  Procedure Laterality Date  . ABDOMINAL HYSTERECTOMY  2013   [place]  . ABDOMINAL SURGERY    . adrenal turmor removal    . AGILE CAPSULE N/A 01/05/2015   Procedure: AGILE CAPSULE;  Surgeon: Daneil Dolin, MD;  Location: [facility];  Service: Endoscopy;  Laterality: N/A;  0700  . Billroth II procedure      [place], first 2000, 2005/2006.  Marland Kitchen BIOPSY  05/20/2013   Procedure: BIOPSIES OF ASCENDING AND SIGMOID COLON;  Surgeon: Daneil Dolin, MD;  Location: [facility] ORS;  Service: Endoscopy;;  . BIOPSY  04/26/2014   Procedure: BIOPSIES;  Surgeon: Danie Binder, MD;  Location: [facility];  Service: Endoscopy;;  . CHOLECYSTECTOMY    . COLONOSCOPY     in [place]  . COLONOSCOPY WITH PROPOFOL N/A 05/20/2013   Dr.Rourk- inadequate prep, normal appearing rectum, grossly normal colon aside from pancolonic diverticula, normal terminal ileum bx= unremarkable colonic mucosa. Due for early interval 2016.   Marland Kitchen COLONOSCOPY WITH PROPOFOL N/A 04/26/2014   NOB:SJGGEZ ileum/one colon polyp removed/moderate pan-colonic diverticulosis/large internal hemorrhoids  . COLONOSCOPY WITH PROPOFOL N/A 12/23/2014   Dr.Rourk- minimal internal hemorrhoids, pancolonic diverticulosis  . DEBRIDEMENT OF ABDOMINAL WALL ABSCESS N/A 02/08/2013   Procedure: DEBRIDEMENT OF ABDOMINAL WALL ABSCESS;  Surgeon: Jamesetta So, MD;  Location: [facility];  Service: General;  Laterality: N/A;  . ESOPHAGOGASTRODUODENOSCOPY (EGD) WITH PROPOFOL N/A 05/20/2013   Dr.Rourk- s/p prior gastric surgery with normal esophagus, residual gastric mucosa and patent efferent limb  . ESOPHAGOGASTRODUODENOSCOPY (EGD) WITH PROPOFOL N/A 02/03/2014   Dr. Gala Romney:  s/p hemigastrectomy with retained gastric contents. Residual gastric mucosa and efferent limb appeared normal otherwise. Query gastroparesis.   Marland Kitchen ESOPHAGOGASTRODUODENOSCOPY (EGD) WITH PROPOFOL N/A 04/26/2014    MOQ:[id number] ring/small HH/mild non-erosive gasrtitis/normal anastomosis  . ESOPHAGOGASTRODUODENOSCOPY (EGD) WITH PROPOFOL N/A 12/23/2014   Dr.Rourk- s/p prior hemigastrctomy, active oozing from anastomotic suture site, hemostasis achieved  . tendon repar Right    wrist  . WOUND EXPLORATION Right 06/24/2013   Procedure: exploration of traumatic wound right wrist;  Surgeon: Tennis Must, MD;  Location: [facility];  Service: Orthopedics;  Laterality: Right;    OB History    Gravida Para Term Preterm AB Living   4 4 4     3    SAB TAB Ectopic Multiple Live Births                   Home Medications    Prior to Admission medications   Medication Sig Start Date End Date Taking? Authorizing Provider  folic acid (FOLVITE) 1 MG tablet Take 1 tablet (1 mg total) by mouth daily. 10/08/15  Yes Rosita Fire, MD  lisinopril (PRINIVIL,ZESTRIL) 10 MG tablet Take 10 mg by mouth daily.   Yes Historical  Provider, MD  ondansetron (ZOFRAN) 4 MG tablet TAKE (1) TABLET BY MOUTH EVERY SIX HOURS. 05/17/16  Yes Mahala Menghini, PA-C  pantoprazole (PROTONIX) 40 MG tablet Take 1 tablet (40 mg total) by mouth 2 (two) times daily before a meal. Patient taking differently: Take 40 mg by mouth daily.  09/04/14  Yes Daleen Bo, MD  doxycycline (VIBRAMYCIN) 100 MG capsule Take 1 capsule (100 mg total) by mouth 2 (two) times daily. Patient not taking: Reported on 05/22/2016 01/02/16   [name], MD  Multiple Vitamin (MULTIVITAMIN WITH MINERALS) TABS tablet Take 1 tablet by mouth daily. Patient not taking: Reported on 05/22/2016 10/08/15   Rosita Fire, MD  polyethylene glycol [facility]) packet Take 17 g by mouth daily. Patient not taking: Reported on 05/22/2016 04/25/16   Evalee Jefferson, PA-C  promethazine (PHENERGAN) 25 MG suppository Place 1 suppository (25 mg total) rectally every 6 (six) hours as needed for nausea or vomiting. Patient not taking: Reported on 05/22/2016 04/25/16   Evalee Jefferson, PA-C  promethazine (PHENERGAN) 25 MG tablet  Take 1 tablet (25 mg total) by mouth every 6 (six) hours as needed for nausea or vomiting. Patient not taking: Reported on 05/22/2016 04/20/16   Forde Dandy, MD  thiamine 100 MG tablet Take 1 tablet (100 mg total) by mouth daily. Patient not taking: Reported on 05/22/2016 10/08/15   Rosita Fire, MD    Family History Family History  Problem Relation Age of Onset  . Brain cancer Son   . Schizophrenia Son   . Cancer Son     brain  . Lung cancer Father   . Cancer Father     mets  . Drug abuse Mother   . Breast cancer Maternal Aunt   . Bipolar disorder Maternal Aunt   . Drug abuse Maternal Aunt   . Colon cancer Maternal Grandmother     late 84s, early 40s  . Drug abuse Sister   . Drug abuse Brother   . Bipolar disorder Paternal Grandfather   . Bipolar disorder Cousin   . Liver disease Neg Hx     Social History Social History  Substance Use Topics  . Smoking status: Current Every Day Smoker    Packs/day: 0.50    Years: 35.00    Types: Cigarettes  . Smokeless tobacco: Never Used  . Alcohol use 7.2 oz/week    12 Cans of beer per week     Comment: 5 12 oz beers a day     Allergies   Codeine and Morphine and related   Review of Systems Review of Systems  Constitutional: Positive for fever. Negative for chills.  HENT: Negative for rhinorrhea and sore throat.   Eyes: Positive for visual disturbance (blurred vision).  Respiratory: Positive for cough and shortness of breath.   Cardiovascular: Positive for chest pain.  Gastrointestinal: Positive for abdominal pain, anal bleeding, blood in stool, nausea and vomiting.  Genitourinary: Negative for dysuria and hematuria.  Musculoskeletal: Positive for back pain. Negative for arthralgias and joint swelling.  Skin: Positive for rash.  Neurological: Positive for dizziness and headaches.  Hematological: Does not bruise/bleed easily.  All other systems reviewed and are negative.    Physical Exam Updated Vital Signs BP 110/88    Pulse 90   Temp 98.3 F (36.8 C) (Oral)   Resp 19   Ht 5\' 3"  (1.6 m)   Wt 58.5 kg   SpO2 95%   BMI 22.85 kg/m   Physical Exam  Constitutional: She  is oriented to person, place, and time. She appears well-developed and well-nourished. No distress.  HENT:  Head: Normocephalic and atraumatic.  Dry mucous membranes.  Eyes: Conjunctivae and EOM are normal. Pupils are equal, round, and reactive to light.  Sclera clear, eyes tracking normally.  Neck: Neck supple.  Cardiovascular: Normal rate, regular rhythm, normal heart sounds and intact distal pulses.   No murmur heard. Pulmonary/Chest: Effort normal and breath sounds normal. No respiratory distress.  Abdominal: Soft. There is tenderness. There is no guarding.  Epigastric and suprapubic tenderness.  Genitourinary: Rectal exam shows guaiac positive stool.  Genitourinary Comments: Total exam without any prolapse to the internal hemorrhoids. Some evidence of some old external hemorrhoid not acutely thrombosed. No rectal mass. Stool with some slight streaking of red blood. Stool not black in color.  Musculoskeletal: She exhibits no edema.  No pitting edema.  Neurological: She is alert and oriented to person, place, and time. She displays normal reflexes. No cranial nerve deficit or sensory deficit. She exhibits normal muscle tone. Coordination normal.  Skin: Skin is warm and dry.  Rash around right ankle.  Psychiatric: She has a normal mood and affect.  Nursing note and vitals reviewed.    ED Treatments / Results  DIAGNOSTIC STUDIES:  Oxygen Saturation is 97% on RA, normal by my interpretation.    COORDINATION OF CARE:  11:11 AM Will admit patient. Discussed treatment plan with pt at bedside and pt agreed to plan.   Labs (all labs ordered are listed, but only abnormal results are displayed) Labs Reviewed  COMPREHENSIVE METABOLIC PANEL - Abnormal; Notable for the following:       Result Value   Sodium 126 (*)    Chloride 94  (*)    CO2 19 (*)    BUN <5 (*)    Calcium 8.8 (*)    AST 90 (*)    Alkaline Phosphatase 131 (*)    All other components within normal limits  LIPASE, BLOOD - Abnormal; Notable for the following:    Lipase <10 (*)    All other components within normal limits  ETHANOL - Abnormal; Notable for the following:    Alcohol, Ethyl (B) 17 (*)    All other components within normal limits  CBC WITH DIFFERENTIAL/PLATELET - Abnormal; Notable for the following:    RBC 3.82 (*)    Hemoglobin 11.2 (*)    HCT 32.4 (*)    All other components within normal limits  RAPID URINE DRUG SCREEN, HOSP PERFORMED  TROPONIN I  POC OCCULT BLOOD, ED    EKG  EKG Interpretation None       Radiology Dg Chest 2 View  Result Date: 05/22/2016 CLINICAL DATA:  Abdominal pain, chest pain, vomiting for 4 days pound EXAM: CHEST  2 VIEW COMPARISON:  04/25/2016 FINDINGS: Cardiomediastinal silhouette is stable. No infiltrate or pleural effusion. No pulmonary edema. Again noted multiple surgical clips in the upper and mid abdomen. IMPRESSION: No active cardiopulmonary disease. Electronically Signed   By: Lahoma Crocker M.D.   On: 05/22/2016 13:53   Ct Abdomen Pelvis W Contrast  Result Date: 05/22/2016 CLINICAL DATA:  Abdominal pain and rectal bleeding. EXAM: CT ABDOMEN AND PELVIS WITH CONTRAST TECHNIQUE: Multidetector CT imaging of the abdomen and pelvis was performed using the standard protocol following bolus administration of intravenous contrast. CONTRAST:  68mL ISOVUE-300 IOPAMIDOL (ISOVUE-300) INJECTION 61%, 159mL ISOVUE-300 IOPAMIDOL (ISOVUE-300) INJECTION 61% COMPARISON:  04/25/2016 FINDINGS: Lower chest: No acute abnormality. Hepatobiliary: Hepatic steatosis. Previous cholecystectomy. Increase  caliber of the common bile duct measures 1.1 cm, image 36 of series 4. Previously 9 mm. No obstructing stone or mass noted. Pancreas: Unremarkable. No pancreatic ductal dilatation or surrounding inflammatory changes. Spleen: Normal  in size without focal abnormality. Adrenals/Urinary Tract: Previous right adrenalectomy. Nodule in the left adrenal gland measures 2 cm, image number 20 of series 2. This is unchanged from the previous exam. Previously described as representing a benign adenoma. Unremarkable appearance of both kidneys. Urinary bladder appears normal. Stomach/Bowel: The stomach is normal. The small bowel loops have a normal course and caliber. No pathologic dilatation of the colon. Vascular/Lymphatic: Aortic atherosclerosis. No enlarged upper abdominal or pelvic lymph nodes. Reproductive: Status post hysterectomy. No adnexal masses. Other: Chronic scarring and postsurgical changes involving the ventral abdominal wall. Musculoskeletal: No aggressive lytic or sclerotic bone lesions. IMPRESSION: 1. No acute findings. 2. Increase caliber of the common bile duct status post cholecystectomy. 3. Stable left adrenal nodule.  Prior right adrenalectomy. 4. Aortic atherosclerosis. Electronically Signed   By: Kerby Moors M.D.   On: 05/22/2016 13:58    Procedures Procedures (including critical care time)  Medications Ordered in ED Medications  0.9 %  sodium chloride infusion (not administered)  sodium chloride 0.9 % bolus 500 mL (500 mLs Intravenous New Bag/Given 05/22/16 1157)  ondansetron (ZOFRAN) injection 4 mg (4 mg Intravenous Given 05/22/16 1158)  HYDROmorphone (DILAUDID) injection 1 mg (1 mg Intravenous Given 05/22/16 1158)  iopamidol (ISOVUE-300) 61 % injection (30 mLs Oral Contrast Given 05/22/16 1149)  iopamidol (ISOVUE-300) 61 % injection 100 mL (100 mLs Intravenous Contrast Given 05/22/16 1310)     Initial Impression / Assessment and Plan / ED Course  I have reviewed the triage vital signs and the nursing notes.  Pertinent labs & imaging results that were available during my care of the patient were reviewed by me and considered in my medical decision making (see chart for details).     Patient presenting with history  of blood per rectum that started 4 days ago. Originally with some epigastric abdominal pain some nausea and vomiting no blood in that. Crampy lower abdominal pain. Patient states she's had at least 4 bowel movements today with blood.   Rectal exam positive for blood and also mixed with stool. No rectal masses.  Based on the history of bleeding patient will require admission and observation. Probably needs follow-up colonoscopy could be done as an outpatient. Patient CT of the abdomen without any acute findings. Labs hemoglobin hematocrit not low. Vital signs normal not tachycardic. But heart rate is in the 90s.  Sodium slightly low at 126. LFTs without significant abnormalities. Patient does have a history of alcohol abuse. Did have a little bit of alcohol and the system today but not significantly elevated.  Final Clinical Impressions(s) / ED Diagnoses   Final diagnoses:  Gastrointestinal hemorrhage, unspecified gastrointestinal hemorrhage type  Alcohol abuse  Hyponatremia    New Prescriptions New Prescriptions   No medications on file   I personally performed the services described in this documentation, which was scribed in my presence. The recorded information has been reviewed and is accurate.       Fredia Sorrow, MD 05/22/16 1432

## 2016-05-22 NOTE — ED Notes (Signed)
Dr. Zackowski at bedside  

## 2016-05-22 NOTE — H&P (Signed)
History and Physical    Angel Price SWN:[id number] DOB: [birth year] DOA: 05/22/2016  Referring MD/NP/PA: Fredia Sorrow, EDP PCP: Rosita Fire, MD  Patient coming from: Home  Chief Complaint: Maroon-colored stool  HPI: Angel Price is a 51 y.o. female with history of alcohol abuse and schizophrenia with multiple admissions for pancreatitis presents to the hospital today with a four-day history of maroon colored stool. She states that the past 3 days the bleeding has been light however today she filled the toilet bowl with blood at which point she came to the hospital for evaluation. She denies feeling dizzy or lightheaded. She did have some crampy abdominal pain. Has never had this before. Rectal exam performed by EDP shows brown colored stool with streaks of blood, FOBT is positive. Hemoglobin is stable at 11.2, she is hyponatremic at 126, AST is elevated at 90 with a normal ALT of 30. She is hemodynamically stable. Admission is requested.  Past Medical/Surgical History: Past Medical History:  Diagnosis Date  . Abscess    soft tissue  . Adrenal mass ([place])   . Alcohol abuse   . Anxiety   . Blood transfusion without reported diagnosis   . Chronic abdominal pain   . Chronic wound infection of abdomen   . Colon polyp    colonoscopy 04/2014  . Depression   . Diverticulosis    colonoscopy 04/2014 moderat pan colonic  . Gastritis    EGD 05/2014  . Gastroparesis Nov 2015  . GERD (gastroesophageal reflux disease)   . Hemorrhoid    internal large  . Hiatal hernia   . History of Billroth II operation   . Hypertension   . Lung nodule    CT 02/2014 needs repeat 1 month  . Lung nodule < 6cm on CT 04/25/2014  . Lupus   . Nausea and vomiting    chronic, recurrent  . Schatzki's ring    patent per EGD 04/2014  . Sickle cell trait ([place])   . Suicide attempt   . 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 ABDOMINAL  HYSTERECTOMY  2013   [place]  . ABDOMINAL SURGERY    . adrenal turmor removal    . AGILE CAPSULE N/A 01/05/2015   Procedure: AGILE CAPSULE;  Surgeon: Daneil Dolin, MD;  [facility]: [facility];  Service: Endoscopy;  Laterality: N/A;  0700  . Billroth II procedure      [place], first 2000, 2005/2006.  Marland Kitchen BIOPSY  05/20/2013   Procedure: BIOPSIES OF ASCENDING AND SIGMOID COLON;  Surgeon: Daneil Dolin, MD;  Location: [facility];  Service: Endoscopy;;  . BIOPSY  04/26/2014   Procedure: BIOPSIES;  Surgeon: Danie Binder, MD;  Location: [facility];  Service: Endoscopy;;  . CHOLECYSTECTOMY    . COLONOSCOPY     in [place]  . COLONOSCOPY WITH PROPOFOL N/A 05/20/2013   Dr.Rourk- inadequate prep, normal appearing rectum, grossly normal colon aside from pancolonic diverticula, normal terminal ileum bx= unremarkable colonic mucosa. Due for early interval 2016.   Marland Kitchen COLONOSCOPY WITH PROPOFOL N/A 04/26/2014   XFG:HWEXHB ileum/one colon polyp removed/moderate pan-colonic diverticulosis/large internal hemorrhoids  . COLONOSCOPY WITH PROPOFOL N/A 12/23/2014   Dr.Rourk- minimal internal hemorrhoids, pancolonic diverticulosis  . DEBRIDEMENT OF ABDOMINAL WALL ABSCESS N/A 02/08/2013   Procedure: DEBRIDEMENT OF ABDOMINAL WALL ABSCESS;  Surgeon: Jamesetta So, MD;  Location: [facility];  Service: General;  Laterality: N/A;  . ESOPHAGOGASTRODUODENOSCOPY (EGD) WITH PROPOFOL  N/A 05/20/2013   Dr.Rourk- s/p prior gastric surgery with normal esophagus, residual gastric mucosa and patent efferent limb  . ESOPHAGOGASTRODUODENOSCOPY (EGD) WITH PROPOFOL N/A 02/03/2014   Dr. Gala Romney:  s/p hemigastrectomy with retained gastric contents. Residual gastric mucosa and efferent limb appeared normal otherwise. Query gastroparesis.   Marland Kitchen ESOPHAGOGASTRODUODENOSCOPY (EGD) WITH PROPOFOL N/A 04/26/2014   EGB:[id number]'V ring/small HH/mild non-erosive gasrtitis/normal anastomosis  . ESOPHAGOGASTRODUODENOSCOPY (EGD) WITH PROPOFOL N/A 12/23/2014   Dr.Rourk-  s/p prior hemigastrctomy, active oozing from anastomotic suture site, hemostasis achieved  . tendon repar Right    wrist  . WOUND EXPLORATION Right 06/24/2013   Procedure: exploration of traumatic wound right wrist;  Surgeon: Tennis Must, MD;  Location: [place];  Service: Orthopedics;  Laterality: Right;    Social History:  reports that she has been smoking Cigarettes.  She has a 17.50 pack-year smoking history. She has never used smokeless tobacco. She reports that she drinks about 7.2 oz of alcohol per week . She reports that she does not use drugs.  Allergies: Allergies  Allergen Reactions  . Codeine Hives  . Morphine And Related Hives    gastritis    Family History:  Family History  Problem Relation Age of Onset  . Brain cancer Son   . Schizophrenia Son   . Cancer Son     brain  . Lung cancer Father   . Cancer Father     mets  . Drug abuse Mother   . Breast cancer Maternal Aunt   . Bipolar disorder Maternal Aunt   . Drug abuse Maternal Aunt   . Colon cancer Maternal Grandmother     late 62s, early 59s  . Drug abuse Sister   . Drug abuse Brother   . Bipolar disorder Paternal Grandfather   . Bipolar disorder Cousin   . Liver disease Neg Hx     Prior to Admission medications   Medication Sig Start Date End Date Taking? Authorizing Provider  folic acid (FOLVITE) 1 MG tablet Take 1 tablet (1 mg total) by mouth daily. 10/08/15  Yes Rosita Fire, MD  lisinopril (PRINIVIL,ZESTRIL) 10 MG tablet Take 10 mg by mouth daily.   Yes Historical Provider, MD  ondansetron (ZOFRAN) 4 MG tablet TAKE (1) TABLET BY MOUTH EVERY SIX HOURS. 05/17/16  Yes Mahala Menghini, PA-C  pantoprazole (PROTONIX) 40 MG tablet Take 1 tablet (40 mg total) by mouth 2 (two) times daily before a meal. Patient taking differently: Take 40 mg by mouth daily.  09/04/14  Yes Daleen Bo, MD  doxycycline (VIBRAMYCIN) 100 MG capsule Take 1 capsule (100 mg total) by mouth 2 (two) times daily. Patient not taking:  Reported on 05/22/2016 01/02/16   [name], MD  Multiple Vitamin (MULTIVITAMIN WITH MINERALS) TABS tablet Take 1 tablet by mouth daily. Patient not taking: Reported on 05/22/2016 10/08/15   Rosita Fire, MD  polyethylene glycol [facility]) packet Take 17 g by mouth daily. Patient not taking: Reported on 05/22/2016 04/25/16   Evalee Jefferson, PA-C  promethazine (PHENERGAN) 25 MG suppository Place 1 suppository (25 mg total) rectally every 6 (six) hours as needed for nausea or vomiting. Patient not taking: Reported on 05/22/2016 04/25/16   Evalee Jefferson, PA-C  promethazine (PHENERGAN) 25 MG tablet Take 1 tablet (25 mg total) by mouth every 6 (six) hours as needed for nausea or vomiting. Patient not taking: Reported on 05/22/2016 04/20/16   Forde Dandy, MD  thiamine 100 MG tablet Take 1 tablet (100 mg total) by mouth  daily. Patient not taking: Reported on 05/22/2016 10/08/15   Rosita Fire, MD    Review of Systems:  Constitutional: Denies fever, chills, diaphoresis, appetite change and fatigue.  HEENT: Denies photophobia, eye pain, redness, hearing loss, ear pain, congestion, sore throat, rhinorrhea, sneezing, mouth sores, trouble swallowing, neck pain, neck stiffness and tinnitus.   Respiratory: Denies SOB, DOE, cough, chest tightness,  and wheezing.   Cardiovascular: Denies chest pain, palpitations and leg swelling.  Gastrointestinal: Denies nausea, vomiting, abdominal pain, diarrhea, constipation,  and abdominal distention.  Genitourinary: Denies dysuria, urgency, frequency, hematuria, flank pain and difficulty urinating.  Endocrine: Denies: hot or cold intolerance, sweats, changes in hair or nails, polyuria, polydipsia. Musculoskeletal: Denies myalgias, back pain, joint swelling, arthralgias and gait problem.  Skin: Denies pallor, rash and wound.  Neurological: Denies dizziness, seizures, syncope, weakness, light-headedness, numbness and headaches.  Hematological: Denies adenopathy. Easy bruising, personal or family  bleeding history  Psychiatric/Behavioral: Denies suicidal ideation, mood changes, confusion, nervousness, sleep disturbance and agitation    Physical Exam: Vitals:   05/22/16 1300 05/22/16 1527 05/22/16 1531 05/22/16 1606  BP: 110/88  116/79 107/75  Pulse: 90 80  88  Resp: 19 20 20 18   Temp:    98.5 F (36.9 C)  TempSrc:    Oral  SpO2: 95% 96%  98%  Weight:    58.1 kg (128 lb)  Height:    5\' 3"  (1.6 m)     Constitutional: NAD, calm, comfortable Eyes: PERRL, lids and conjunctivae normal ENMT: Mucous membranes are moist. Posterior pharynx clear of any exudate or lesions.Normal dentition.  Neck: normal, supple, no masses, no thyromegaly Respiratory: clear to auscultation bilaterally, no wheezing, no crackles. Normal respiratory effort. No accessory muscle use.  Cardiovascular: Regular rate and rhythm, no murmurs / rubs / gallops. No extremity edema. 2+ pedal pulses. No carotid bruits.  Abdomen: Positive bowel sounds, no rebound or guarding, she has two linear wounds to her abdomen also which are draining purulent material Musculoskeletal: no clubbing / cyanosis. No joint deformity upper and lower extremities. Good ROM, no contractures. Normal muscle tone.  Skin: no rashes, lesions, ulcers. No induration Neurologic: CN 2-12 grossly intact. Sensation intact, DTR normal. Strength 5/5 in all 4.  Psychiatric: Normal judgment and insight. Alert and oriented x 3. Normal mood.    Labs on Admission: I have personally reviewed the following labs and imaging studies  CBC:  Recent Labs Lab 05/22/16 1206  WBC 4.5  NEUTROABS 2.4  HGB 11.2*  HCT 32.4*  MCV 84.8  PLT 791   Basic Metabolic Panel:  Recent Labs Lab 05/22/16 1206  NA 126*  K 4.4  CL 94*  CO2 19*  GLUCOSE 70  BUN <5*  CREATININE 0.57  CALCIUM 8.8*   GFR: Estimated Creatinine Clearance: 71.1 mL/min (by C-G formula based on SCr of 0.57 mg/dL). Liver Function Tests:  Recent Labs Lab 05/22/16 1206  AST 90*    ALT 30  ALKPHOS 131*  BILITOT 1.0  PROT 7.4  ALBUMIN 3.6    Recent Labs Lab 05/22/16 1206  LIPASE <10*   No results for input(s): AMMONIA in the last 168 hours. Coagulation Profile: No results for input(s): INR, PROTIME in the last 168 hours. Cardiac Enzymes:  Recent Labs Lab 05/22/16 1206  TROPONINI <0.03   BNP (last 3 results) No results for input(s): PROBNP in the last 8760 hours. HbA1C: No results for input(s): HGBA1C in the last 72 hours. CBG: No results for input(s): GLUCAP in the last  168 hours. Lipid Profile: No results for input(s): CHOL, HDL, LDLCALC, TRIG, CHOLHDL, LDLDIRECT in the last 72 hours. Thyroid Function Tests: No results for input(s): TSH, T4TOTAL, FREET4, T3FREE, THYROIDAB in the last 72 hours. Anemia Panel: No results for input(s): VITAMINB12, FOLATE, FERRITIN, TIBC, IRON, RETICCTPCT in the last 72 hours. Urine analysis:    Component Value Date/Time   COLORURINE YELLOW 04/25/2016 1510   APPEARANCEUR HAZY (A) 04/25/2016 1510   LABSPEC 1.014 04/25/2016 1510   PHURINE 5.0 04/25/2016 1510   GLUCOSEU NEGATIVE 04/25/2016 1510   HGBUR NEGATIVE 04/25/2016 1510   BILIRUBINUR NEGATIVE 04/25/2016 1510   KETONESUR NEGATIVE 04/25/2016 1510   PROTEINUR NEGATIVE 04/25/2016 1510   UROBILINOGEN 0.2 11/26/2014 0915   NITRITE NEGATIVE 04/25/2016 1510   LEUKOCYTESUR LARGE (A) 04/25/2016 1510   Sepsis Labs: @LABRCNTIP (procalcitonin:4,lacticidven:4) )No results found for this or any previous visit (from the past 240 hour(s)).   Radiological Exams on Admission: Dg Chest 2 View  Result Date: 05/22/2016 CLINICAL DATA:  Abdominal pain, chest pain, vomiting for 4 days pound EXAM: CHEST  2 VIEW COMPARISON:  04/25/2016 FINDINGS: Cardiomediastinal silhouette is stable. No infiltrate or pleural effusion. No pulmonary edema. Again noted multiple surgical clips in the upper and mid abdomen. IMPRESSION: No active cardiopulmonary disease. Electronically Signed   By:  Lahoma Crocker M.D.   On: 05/22/2016 13:53   Ct Abdomen Pelvis W Contrast  Result Date: 05/22/2016 CLINICAL DATA:  Abdominal pain and rectal bleeding. EXAM: CT ABDOMEN AND PELVIS WITH CONTRAST TECHNIQUE: Multidetector CT imaging of the abdomen and pelvis was performed using the standard protocol following bolus administration of intravenous contrast. CONTRAST:  42mL ISOVUE-300 IOPAMIDOL (ISOVUE-300) INJECTION 61%, 119mL ISOVUE-300 IOPAMIDOL (ISOVUE-300) INJECTION 61% COMPARISON:  04/25/2016 FINDINGS: Lower chest: No acute abnormality. Hepatobiliary: Hepatic steatosis. Previous cholecystectomy. Increase caliber of the common bile duct measures 1.1 cm, image 36 of series 4. Previously 9 mm. No obstructing stone or mass noted. Pancreas: Unremarkable. No pancreatic ductal dilatation or surrounding inflammatory changes. Spleen: Normal in size without focal abnormality. Adrenals/Urinary Tract: Previous right adrenalectomy. Nodule in the left adrenal gland measures 2 cm, image number 20 of series 2. This is unchanged from the previous exam. Previously described as representing a benign adenoma. Unremarkable appearance of both kidneys. Urinary bladder appears normal. Stomach/Bowel: The stomach is normal. The small bowel loops have a normal course and caliber. No pathologic dilatation of the colon. Vascular/Lymphatic: Aortic atherosclerosis. No enlarged upper abdominal or pelvic lymph nodes. Reproductive: Status post hysterectomy. No adnexal masses. Other: Chronic scarring and postsurgical changes involving the ventral abdominal wall. Musculoskeletal: No aggressive lytic or sclerotic bone lesions. IMPRESSION: 1. No acute findings. 2. Increase caliber of the common bile duct status post cholecystectomy. 3. Stable left adrenal nodule.  Prior right adrenalectomy. 4. Aortic atherosclerosis. Electronically Signed   By: Kerby Moors M.D.   On: 05/22/2016 13:58    EKG: Independently reviewed. None obtained in  ED  Assessment/Plan Principal Problem:   GI bleed Active Problems:   History of cocaine abuse   History of schizoaffective disorder   Bipolar disorder (HCC)   Personality disorder   Tobacco abuse   Alcohol abuse   Abdominal wall abscess    GI bleed -Given her story of maroon colored stools coupled with her history of EtOH use need to consider upper as a potential source. -Place on IV twice a day PPI. -We'll allow clear liquids tonight and keep nothing by mouth for potential endoscopic studies in a.m. GI has been consulted. -  We'll hold 2 units of PRBCs, however no transfusion at present as her hemoglobin is stable. -Chek CBC every 8 hours over the next 24 hours.  History of abdominal wall abscess with purulent drainage -She states this has been draining since her surgery in 2014, unclear what if any follow-up she has had. -We'll obtain CT scan of the abdomen to further evaluate, will request surgical consultation in a.m.  History of EtOH use -Thiamine/folate, monitor for withdrawals. -AST/ALT ratio is consistent with alcohol use.   DVT prophylaxis: SCDs  Code Status: Full code  Family Communication: Patient only  Disposition Plan: To be determined  Consults called: GI, surgery  Admission status: Observation    Time Spent: 85 minutes  Lelon Frohlich MD [facility] Pager [phone]  If 7PM-7AM, please contact night-coverage [email or web address] Password [facility]  05/22/2016, 5:19 PM

## 2016-05-23 ENCOUNTER — Encounter (HOSPITAL_COMMUNITY)
Admission: EM | Disposition: A | Discharge status: Home / Self Care | Payer: Self-pay | Source: Home / Self Care | Attending: Internal Medicine

## 2016-05-23 ENCOUNTER — Observation Stay (HOSPITAL_COMMUNITY): Payer: Medicare HMO | Admitting: Anesthesiology

## 2016-05-23 ENCOUNTER — Encounter (HOSPITAL_COMMUNITY): Payer: Self-pay | Admitting: Gastroenterology

## 2016-05-23 DIAGNOSIS — Z87898 Personal history of other specified conditions: Secondary | ICD-10-CM | POA: Diagnosis not present

## 2016-05-23 DIAGNOSIS — R69 Illness, unspecified: Secondary | ICD-10-CM | POA: Diagnosis not present

## 2016-05-23 DIAGNOSIS — K625 Hemorrhage of anus and rectum: Secondary | ICD-10-CM | POA: Diagnosis not present

## 2016-05-23 DIAGNOSIS — F1010 Alcohol abuse, uncomplicated: Secondary | ICD-10-CM

## 2016-05-23 DIAGNOSIS — E871 Hypo-osmolality and hyponatremia: Secondary | ICD-10-CM | POA: Diagnosis not present

## 2016-05-23 DIAGNOSIS — S31100D Unspecified open wound of abdominal wall, right upper quadrant without penetration into peritoneal cavity, subsequent encounter: Secondary | ICD-10-CM | POA: Diagnosis not present

## 2016-05-23 DIAGNOSIS — K3184 Gastroparesis: Secondary | ICD-10-CM | POA: Diagnosis not present

## 2016-05-23 DIAGNOSIS — Z980 Intestinal bypass and anastomosis status: Secondary | ICD-10-CM | POA: Diagnosis not present

## 2016-05-23 DIAGNOSIS — L02211 Cutaneous abscess of abdominal wall: Secondary | ICD-10-CM | POA: Diagnosis not present

## 2016-05-23 DIAGNOSIS — K922 Gastrointestinal hemorrhage, unspecified: Secondary | ICD-10-CM | POA: Diagnosis not present

## 2016-05-23 DIAGNOSIS — K3182 Dieulafoy lesion (hemorrhagic) of stomach and duodenum: Secondary | ICD-10-CM | POA: Diagnosis not present

## 2016-05-23 DIAGNOSIS — L03311 Cellulitis of abdominal wall: Secondary | ICD-10-CM

## 2016-05-23 DIAGNOSIS — D649 Anemia, unspecified: Secondary | ICD-10-CM | POA: Diagnosis not present

## 2016-05-23 DIAGNOSIS — K921 Melena: Secondary | ICD-10-CM | POA: Diagnosis not present

## 2016-05-23 HISTORY — PX: ESOPHAGOGASTRODUODENOSCOPY (EGD) WITH PROPOFOL: SHX5813

## 2016-05-23 LAB — BASIC METABOLIC PANEL
ANION GAP: 7 (ref 5–15)
BUN: <5 mg/dL — ABNORMAL LOW (ref 6–20)
CALCIUM: 8.5 mg/dL — AB (ref 8.9–10.3)
CO2: 23 mmol/L (ref 22–32)
Chloride: 101 mmol/L (ref 101–111)
Creatinine, Ser: 0.66 mg/dL (ref 0.44–1.00)
GFR calc Af Amer: >60 mL/min (ref >60)
Glucose, Bld: 90 mg/dL (ref 65–99)
Potassium: 3.9 mmol/L (ref 3.5–5.1)
SODIUM: 131 mmol/L — AB (ref 135–145)

## 2016-05-23 LAB — CBC
HCT: 28.8 % — ABNORMAL LOW (ref 36.0–46.0)
HEMATOCRIT: 28.2 % — AB (ref 36.0–46.0)
HEMOGLOBIN: 9.9 g/dL — AB (ref 12.0–15.0)
Hemoglobin: 9.7 g/dL — ABNORMAL LOW (ref 12.0–15.0)
MCH: 29.7 pg (ref 26.0–34.0)
MCH: 29.7 pg (ref 26.0–34.0)
MCHC: 34.4 g/dL (ref 30.0–36.0)
MCHC: 34.4 g/dL (ref 30.0–36.0)
MCV: 86.5 fL (ref 78.0–100.0)
MCV: 86.2 fL (ref 78.0–100.0)
PLATELETS: 159 10*3/uL (ref 150–400)
Platelets: 164 10*3/uL (ref 150–400)
RBC: 3.33 MIL/uL — AB (ref 3.87–5.11)
RBC: 3.27 MIL/uL — AB (ref 3.87–5.11)
RDW: 14.3 % (ref 11.5–15.5)
RDW: 14.4 % (ref 11.5–15.5)
WBC: 4.6 10*3/uL (ref 4.0–10.5)
WBC: 3.9 10*3/uL — AB (ref 4.0–10.5)

## 2016-05-23 LAB — HIV ANTIBODY (ROUTINE TESTING W REFLEX): HIV SCREEN 4TH GENERATION: NONREACTIVE

## 2016-05-23 SURGERY — ESOPHAGOGASTRODUODENOSCOPY (EGD) WITH PROPOFOL
Anesthesia: Monitor Anesthesia Care

## 2016-05-23 MED ORDER — FENTANYL CITRATE (PF) 100 MCG/2ML IJ SOLN
INTRAMUSCULAR | Status: AC
  Filled 2016-05-23: qty 2

## 2016-05-23 MED ORDER — ONDANSETRON HCL 4 MG/2ML IJ SOLN
INTRAMUSCULAR | Status: AC
  Filled 2016-05-23: qty 2

## 2016-05-23 MED ORDER — LIDOCAINE VISCOUS 2 % MT SOLN: 15.0000 mL | Freq: Once | OROMUCOSAL | Status: DC

## 2016-05-23 MED ORDER — SODIUM CHLORIDE 0.9 % IV SOLN: INTRAVENOUS | Status: DC

## 2016-05-23 MED ORDER — PROPOFOL 10 MG/ML IV BOLUS
INTRAVENOUS | Status: DC | PRN
  Administered 2016-05-23: 20 mg via INTRAVENOUS
  Administered 2016-05-23: 30 mg via INTRAVENOUS
  Administered 2016-05-23 (×2): 20 mg via INTRAVENOUS

## 2016-05-23 MED ORDER — ONDANSETRON HCL 4 MG/2ML IJ SOLN
4.0000 mg | Freq: Once | INTRAMUSCULAR | Status: AC
  Administered 2016-05-23: 4 mg via INTRAVENOUS

## 2016-05-23 MED ORDER — FENTANYL CITRATE (PF) 100 MCG/2ML IJ SOLN
25.0000 ug | INTRAMUSCULAR | Status: DC
  Administered 2016-05-23: 25 ug via INTRAVENOUS

## 2016-05-23 MED ORDER — LIDOCAINE HCL (PF) 1 % IJ SOLN
INTRAMUSCULAR | Status: AC
  Filled 2016-05-23: qty 5

## 2016-05-23 MED ORDER — PROPOFOL 10 MG/ML IV BOLUS
INTRAVENOUS | Status: AC
  Filled 2016-05-23: qty 20

## 2016-05-23 MED ORDER — PROPOFOL 500 MG/50ML IV EMUL
INTRAVENOUS | Status: DC | PRN
  Administered 2016-05-23: 150 ug/kg/min via INTRAVENOUS
  Administered 2016-05-23: 12:00:00 via INTRAVENOUS

## 2016-05-23 MED ORDER — SODIUM CHLORIDE 0.9 % IJ SOLN
PREFILLED_SYRINGE | INTRAMUSCULAR | Status: DC | PRN
  Administered 2016-05-23: 3 mL

## 2016-05-23 MED ORDER — PRO-STAT SUGAR FREE PO LIQD
30.0000 mL | Freq: Two times a day (BID) | ORAL | Status: DC
  Filled 2016-05-23 (×5): qty 30

## 2016-05-23 MED ORDER — EPINEPHRINE PF 1 MG/10ML IJ SOSY
PREFILLED_SYRINGE | INTRAMUSCULAR | Status: AC
  Filled 2016-05-23: qty 10

## 2016-05-23 MED ORDER — MIDAZOLAM HCL 2 MG/2ML IJ SOLN
INTRAMUSCULAR | Status: AC
  Filled 2016-05-23: qty 2

## 2016-05-23 MED ORDER — LIDOCAINE VISCOUS 2 % MT SOLN
OROMUCOSAL | Status: AC
  Filled 2016-05-23: qty 15

## 2016-05-23 MED ORDER — LACTATED RINGERS IV SOLN
INTRAVENOUS | Status: DC
  Administered 2016-05-23: 11:00:00 via INTRAVENOUS

## 2016-05-23 MED ORDER — MIDAZOLAM HCL 2 MG/2ML IJ SOLN
1.0000 mg | INTRAMUSCULAR | Status: DC
  Administered 2016-05-23: 2 mg via INTRAVENOUS

## 2016-05-23 NOTE — Care Management Note (Signed)
Case Management Note  Patient Details  Name: Angel Price MRN: [id number] Date of Birth: 24-Sep-1965  Subjective/Objective:                  Pt admitted with GIB. She is from home, lives with family and is ind with ADL's. She has no HH or DME needs pta. She has pcp, transportation and no difficulty affording medications. She plans to return home with self care.  Action/Plan: No CM needs anticipated.   Expected Discharge Date:     05/24/2016             Expected Discharge Plan:  Home/Self Care  In-House Referral:  NA  Discharge planning Services  NA  Post Acute Care Choice:  NA Choice offered to:  NA  Status of Service:  Completed, signed off  Sherald Barge, RN 05/23/2016, 9:33 AM

## 2016-05-23 NOTE — Care Management Obs Status (Signed)
[place] NOTIFICATION   Patient Details  Name: Angel Price MRN: [id number] Date of Birth: June 03, 1965   Medicare Observation Status Notification Given:  Yes    Sherald Barge, RN 05/23/2016, 9:16 AM

## 2016-05-23 NOTE — Consult Note (Signed)
Reason for Consult: Abdominal wall abscess Referring Physician: Dr. Bryn Gulling is an 51 y.o. female.  HPI: Patient is a 51 year old black female who I have seen in the remote past for a recurrent abdominal wall abscess. She has had multiple abdominal surgeries in the past. Incision and drainage of the abdominal wall abscess had been done by me in the remote past. Due to its chronicity, she was referred to plastics in [place], who ultimately referred the patient to [facility] plastic surgery. They evaluated her in 2014, but ultimately no surgery was performed due to a concern of a left adrenal mass. She has been chronically draining since that time. She got lost to follow-up with them. She denies any fever or chills. There is no been no recent change in the drainage. Currently has no pain.  Past Medical History:  Diagnosis Date  . Abscess    soft tissue  . Adrenal mass ([place])   . Alcohol abuse   . Anxiety   . Blood transfusion without reported diagnosis   . Chronic abdominal pain   . Chronic wound infection of abdomen   . Colon polyp    colonoscopy 04/2014  . Depression   . Diverticulosis    colonoscopy 04/2014 moderat pan colonic  . Gastritis    EGD 05/2014  . Gastroparesis Nov 2015  . GERD (gastroesophageal reflux disease)   . Hemorrhoid    internal large  . Hiatal hernia   . History of Billroth II operation   . Hypertension   . Lung nodule    CT 02/2014 needs repeat 1 month  . Lung nodule < 6cm on CT 04/25/2014  . Lupus   . Nausea and vomiting    chronic, recurrent  . Schatzki's ring    patent per EGD 04/2014  . Sickle cell trait ([place])   . Suicide attempt   . 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 ABDOMINAL HYSTERECTOMY  2013   [place]  . ABDOMINAL SURGERY    . adrenal turmor removal    . AGILE CAPSULE N/A 01/05/2015   Procedure: AGILE CAPSULE;  Surgeon: Daneil Dolin, MD;  Location: [facility];  Service: Endoscopy;  Laterality: N/A;  0700  . Billroth II procedure      [place], first 2000, 2005/2006.  Marland Kitchen BIOPSY  05/20/2013   Procedure: BIOPSIES OF ASCENDING AND SIGMOID COLON;  Surgeon: Daneil Dolin, MD;  Location: [facility] ORS;  Service: Endoscopy;;  . BIOPSY  04/26/2014   Procedure: BIOPSIES;  Surgeon: Danie Binder, MD;  Location: [facility];  Service: Endoscopy;;  . CHOLECYSTECTOMY    . COLONOSCOPY     in [place]  . COLONOSCOPY WITH PROPOFOL N/A 05/20/2013   Dr.Rourk- inadequate prep, normal appearing rectum, grossly normal colon aside from pancolonic diverticula, normal terminal ileum bx= unremarkable colonic mucosa. Due for early interval 2016.   Marland Kitchen COLONOSCOPY WITH PROPOFOL N/A 04/26/2014   ZOX:WRUEAV ileum/one colon polyp removed/moderate pan-colonic diverticulosis/large internal hemorrhoids  . COLONOSCOPY WITH PROPOFOL N/A 12/23/2014   Dr.Rourk- minimal internal hemorrhoids, pancolonic diverticulosis  . DEBRIDEMENT OF ABDOMINAL WALL ABSCESS N/A 02/08/2013   Procedure: DEBRIDEMENT OF ABDOMINAL WALL ABSCESS;  Surgeon: Jamesetta So, MD;  Location: [facility];  Service: General;  Laterality: N/A;  . ESOPHAGOGASTRODUODENOSCOPY (EGD) WITH PROPOFOL N/A 05/20/2013   Dr.Rourk- s/p prior gastric surgery with normal esophagus, residual gastric mucosa and patent efferent limb  . ESOPHAGOGASTRODUODENOSCOPY (  EGD) WITH PROPOFOL N/A 02/03/2014   Dr. Gala Romney:  s/p hemigastrectomy with retained gastric contents. Residual gastric mucosa and efferent limb appeared normal otherwise. Query gastroparesis.   Marland Kitchen ESOPHAGOGASTRODUODENOSCOPY (EGD) WITH PROPOFOL N/A 04/26/2014   EUM:PNTIRWER'X ring/small HH/mild non-erosive gasrtitis/normal anastomosis  . ESOPHAGOGASTRODUODENOSCOPY (EGD) WITH PROPOFOL N/A 12/23/2014   Dr.Rourk- s/p prior hemigastrctomy, active oozing from anastomotic suture site, hemostasis achieved  . tendon repar Right    wrist  . WOUND EXPLORATION Right 06/24/2013   Procedure: exploration of  traumatic wound right wrist;  Surgeon: Tennis Must, MD;  Location: [place];  Service: Orthopedics;  Laterality: Right;    Family History  Problem Relation Age of Onset  . Brain cancer Son   . Schizophrenia Son   . Cancer Son     brain  . Lung cancer Father   . Cancer Father     mets  . Drug abuse Mother   . Breast cancer Maternal Aunt   . Bipolar disorder Maternal Aunt   . Drug abuse Maternal Aunt   . Colon cancer Maternal Grandmother     late 6s, early 32s  . Drug abuse Sister   . Drug abuse Brother   . Bipolar disorder Paternal Grandfather   . Bipolar disorder Cousin   . Liver disease Neg Hx     Social History:  reports that she has been smoking Cigarettes.  She has a 17.50 pack-year smoking history. She has never used smokeless tobacco. She reports that she drinks about 7.2 oz of alcohol per week . She reports that she does not use drugs.  Allergies:  Allergies  Allergen Reactions  . Codeine Hives    Pt states she can tolerate Percocet  . Morphine And Related Hives    gastritis    Medications:  Prior to Admission:  Prescriptions Prior to Admission  Medication Sig Dispense Refill Last Dose  . folic acid (FOLVITE) 1 MG tablet Take 1 tablet (1 mg total) by mouth daily. 30 tablet 3 05/22/2016 at Unknown time  . lisinopril (PRINIVIL,ZESTRIL) 10 MG tablet Take 10 mg by mouth daily.   05/22/2016 at Unknown time  . ondansetron (ZOFRAN) 4 MG tablet TAKE (1) TABLET BY MOUTH EVERY SIX HOURS. 30 tablet 0 05/22/2016 at Unknown time  . pantoprazole (PROTONIX) 40 MG tablet Take 1 tablet (40 mg total) by mouth 2 (two) times daily before a meal. (Patient taking differently: Take 40 mg by mouth daily. ) 60 tablet 0 05/22/2016 at Unknown time  . doxycycline (VIBRAMYCIN) 100 MG capsule Take 1 capsule (100 mg total) by mouth 2 (two) times daily. (Patient not taking: Reported on 05/22/2016) 20 capsule 0 Completed Course at Unknown time  . Multiple Vitamin (MULTIVITAMIN WITH MINERALS) TABS tablet  Take 1 tablet by mouth daily. (Patient not taking: Reported on 05/22/2016) 30 tablet 3 Not Taking at Unknown time  . polyethylene glycol (MIRALAX) packet Take 17 g by mouth daily. (Patient not taking: Reported on 05/22/2016) 30 each 0 Not Taking at Unknown time  . promethazine (PHENERGAN) 25 MG suppository Place 1 suppository (25 mg total) rectally every 6 (six) hours as needed for nausea or vomiting. (Patient not taking: Reported on 05/22/2016) 20 each 0 Completed Course at Unknown time  . promethazine (PHENERGAN) 25 MG tablet Take 1 tablet (25 mg total) by mouth every 6 (six) hours as needed for nausea or vomiting. (Patient not taking: Reported on 05/22/2016) 30 tablet 0 Completed Course at Unknown time  . thiamine 100 MG tablet  Take 1 tablet (100 mg total) by mouth daily. (Patient not taking: Reported on 05/22/2016) 30 tablet 0 Not Taking at Unknown time   Scheduled: . sodium chloride   Intravenous Once  . feeding supplement  1 Container Oral TID BM  . folic acid  1 mg Oral Daily  . multivitamin with minerals  1 tablet Oral Daily  . pantoprazole (PROTONIX) IV  40 mg Intravenous Q12H  . thiamine  100 mg Oral Daily    Results for orders placed or performed during the hospital encounter of 05/22/16 (from the past 48 hour(s))  Rapid urine drug screen (hospital performed)     Status: None   Collection Time: 05/22/16 11:38 AM  Result Value Ref Range   Opiates NONE DETECTED NONE DETECTED   Cocaine NONE DETECTED NONE DETECTED   Benzodiazepines NONE DETECTED NONE DETECTED   Amphetamines NONE DETECTED NONE DETECTED   Tetrahydrocannabinol NONE DETECTED NONE DETECTED   Barbiturates NONE DETECTED NONE DETECTED    Comment:        DRUG SCREEN FOR MEDICAL PURPOSES ONLY.  IF CONFIRMATION IS NEEDED FOR ANY PURPOSE, NOTIFY LAB WITHIN 5 DAYS.        LOWEST DETECTABLE LIMITS FOR URINE DRUG SCREEN Drug Class       Cutoff (ng/mL) Amphetamine      1000 Barbiturate      200 Benzodiazepine   466 Tricyclics        599 Opiates          300 Cocaine          300 THC              50   Comprehensive metabolic panel     Status: Abnormal   Collection Time: 05/22/16 12:06 PM  Result Value Ref Range   Sodium 126 (L) 135 - 145 mmol/L   Potassium 4.4 3.5 - 5.1 mmol/L   Chloride 94 (L) 101 - 111 mmol/L   CO2 19 (L) 22 - 32 mmol/L   Glucose, Bld 70 65 - 99 mg/dL   BUN <5 (L) 6 - 20 mg/dL   Creatinine, Ser 0.57 0.44 - 1.00 mg/dL   Calcium 8.8 (L) 8.9 - 10.3 mg/dL   Total Protein 7.4 6.5 - 8.1 g/dL   Albumin 3.6 3.5 - 5.0 g/dL   AST 90 (H) 15 - 41 U/L   ALT 30 14 - 54 U/L   Alkaline Phosphatase 131 (H) 38 - 126 U/L   Total Bilirubin 1.0 0.3 - 1.2 mg/dL   GFR calc non Af Amer >60 >60 mL/min   GFR calc Af Amer >60 >60 mL/min    Comment: (NOTE) The eGFR has been calculated using the CKD EPI equation. This calculation has not been validated in all clinical situations. eGFR's persistently <60 mL/min signify possible Chronic Kidney Disease.    Anion gap 13 5 - 15  Lipase, blood     Status: Abnormal   Collection Time: 05/22/16 12:06 PM  Result Value Ref Range   Lipase <10 (L) 11 - 51 U/L  Ethanol     Status: Abnormal   Collection Time: 05/22/16 12:06 PM  Result Value Ref Range   Alcohol, Ethyl (B) 17 (H) <5 mg/dL    Comment:        LOWEST DETECTABLE LIMIT FOR SERUM ALCOHOL IS 5 mg/dL FOR MEDICAL PURPOSES ONLY   CBC with Differential/Platelet     Status: Abnormal   Collection Time: 05/22/16 12:06 PM  Result Value Ref Range  WBC 4.5 4.0 - 10.5 K/uL   RBC 3.82 (L) 3.87 - 5.11 MIL/uL   Hemoglobin 11.2 (L) 12.0 - 15.0 g/dL   HCT 32.4 (L) 36.0 - 46.0 %   MCV 84.8 78.0 - 100.0 fL   MCH 29.3 26.0 - 34.0 pg   MCHC 34.6 30.0 - 36.0 g/dL   RDW 14.3 11.5 - 15.5 %   Platelets 184 150 - 400 K/uL   Neutrophils Relative % 53 %   Neutro Abs 2.4 1.7 - 7.7 K/uL   Lymphocytes Relative 37 %   Lymphs Abs 1.7 0.7 - 4.0 K/uL   Monocytes Relative 10 %   Monocytes Absolute 0.4 0.1 - 1.0 K/uL   Eosinophils  Relative 0 %   Eosinophils Absolute 0.0 0.0 - 0.7 K/uL   Basophils Relative 0 %   Basophils Absolute 0.0 0.0 - 0.1 K/uL  Troponin I     Status: None   Collection Time: 05/22/16 12:06 PM  Result Value Ref Range   Troponin I <0.03 <0.03 ng/mL  POC occult blood, ED Provider will collect     Status: Abnormal   Collection Time: 05/22/16  2:31 PM  Result Value Ref Range   Fecal Occult Bld POSITIVE (A) NEGATIVE  MRSA PCR Screening     Status: None   Collection Time: 05/22/16  4:15 PM  Result Value Ref Range   MRSA by PCR NEGATIVE NEGATIVE    Comment:        The GeneXpert MRSA Assay (FDA approved for NASAL specimens only), is one component of a comprehensive MRSA colonization surveillance program. It is not intended to diagnose MRSA infection nor to guide or monitor treatment for MRSA infections.   HIV antibody (Routine Testing)     Status: None   Collection Time: 05/22/16  5:37 PM  Result Value Ref Range   HIV Screen 4th Generation wRfx Non Reactive Non Reactive    Comment: (NOTE) Performed At: [facility] [place], [place] [id number] Lindon Romp MD [id number]   Type and screen [facility]     Status: None (Preliminary result)   Collection Time: 05/22/16  5:37 PM  Result Value Ref Range   ABO/RH(D) O POS    Antibody Screen NEG    Sample Expiration 05/25/2016    Unit Number [id number]    Blood Component Type RBC LR PHER1    Unit division 00    Status of Unit ALLOCATED    Transfusion Status OK TO TRANSFUSE    Crossmatch Result Compatible    Unit Number [id number]    Blood Component Type RBC LR PHER2    Unit division 00    Status of Unit ALLOCATED    Transfusion Status OK TO TRANSFUSE    Crossmatch Result Compatible   CBC     Status: Abnormal   Collection Time: 05/22/16  5:37 PM  Result Value Ref Range   WBC 4.3 4.0 - 10.5 K/uL   RBC 3.71 (L) 3.87 - 5.11 MIL/uL   Hemoglobin 11.1 (L) 12.0 - 15.0 g/dL   HCT 31.6 (L) 36.0 -  46.0 %   MCV 85.2 78.0 - 100.0 fL   MCH 29.9 26.0 - 34.0 pg   MCHC 35.1 30.0 - 36.0 g/dL   RDW 14.4 11.5 - 15.5 %   Platelets 155 150 - 400 K/uL  Prepare RBC     Status: None   Collection Time: 05/22/16  5:38 PM  Result Value Ref Range  Order Confirmation ORDER PROCESSED BY BLOOD BANK   Basic metabolic panel     Status: Abnormal   Collection Time: 05/23/16  1:43 AM  Result Value Ref Range   Sodium 131 (L) 135 - 145 mmol/L   Potassium 3.9 3.5 - 5.1 mmol/L   Chloride 101 101 - 111 mmol/L   CO2 23 22 - 32 mmol/L   Glucose, Bld 90 65 - 99 mg/dL   BUN <5 (L) 6 - 20 mg/dL   Creatinine, Ser 0.66 0.44 - 1.00 mg/dL   Calcium 8.5 (L) 8.9 - 10.3 mg/dL   GFR calc non Af Amer >60 >60 mL/min   GFR calc Af Amer >60 >60 mL/min    Comment: (NOTE) The eGFR has been calculated using the CKD EPI equation. This calculation has not been validated in all clinical situations. eGFR's persistently <60 mL/min signify possible Chronic Kidney Disease.    Anion gap 7 5 - 15  CBC     Status: Abnormal   Collection Time: 05/23/16  1:43 AM  Result Value Ref Range   WBC 4.6 4.0 - 10.5 K/uL   RBC 3.33 (L) 3.87 - 5.11 MIL/uL   Hemoglobin 9.9 (L) 12.0 - 15.0 g/dL   HCT 28.8 (L) 36.0 - 46.0 %   MCV 86.5 78.0 - 100.0 fL   MCH 29.7 26.0 - 34.0 pg   MCHC 34.4 30.0 - 36.0 g/dL   RDW 14.3 11.5 - 15.5 %   Platelets 164 150 - 400 K/uL    Dg Chest 2 View  Result Date: 05/22/2016 CLINICAL DATA:  Abdominal pain, chest pain, vomiting for 4 days pound EXAM: CHEST  2 VIEW COMPARISON:  04/25/2016 FINDINGS: Cardiomediastinal silhouette is stable. No infiltrate or pleural effusion. No pulmonary edema. Again noted multiple surgical clips in the upper and mid abdomen. IMPRESSION: No active cardiopulmonary disease. Electronically Signed   By: Lahoma Crocker M.D.   On: 05/22/2016 13:53   Ct Abdomen Pelvis W Contrast  Result Date: 05/22/2016 CLINICAL DATA:  Abdominal pain and rectal bleeding. EXAM: CT ABDOMEN AND PELVIS WITH  CONTRAST TECHNIQUE: Multidetector CT imaging of the abdomen and pelvis was performed using the standard protocol following bolus administration of intravenous contrast. CONTRAST:  36m ISOVUE-300 IOPAMIDOL (ISOVUE-300) INJECTION 61%, 1031mISOVUE-300 IOPAMIDOL (ISOVUE-300) INJECTION 61% COMPARISON:  04/25/2016 FINDINGS: Lower chest: No acute abnormality. Hepatobiliary: Hepatic steatosis. Previous cholecystectomy. Increase caliber of the common bile duct measures 1.1 cm, image 36 of series 4. Previously 9 mm. No obstructing stone or mass noted. Pancreas: Unremarkable. No pancreatic ductal dilatation or surrounding inflammatory changes. Spleen: Normal in size without focal abnormality. Adrenals/Urinary Tract: Previous right adrenalectomy. Nodule in the left adrenal gland measures 2 cm, image number 20 of series 2. This is unchanged from the previous exam. Previously described as representing a benign adenoma. Unremarkable appearance of both kidneys. Urinary bladder appears normal. Stomach/Bowel: The stomach is normal. The small bowel loops have a normal course and caliber. No pathologic dilatation of the colon. Vascular/Lymphatic: Aortic atherosclerosis. No enlarged upper abdominal or pelvic lymph nodes. Reproductive: Status post hysterectomy. No adnexal masses. Other: Chronic scarring and postsurgical changes involving the ventral abdominal wall. Musculoskeletal: No aggressive lytic or sclerotic bone lesions. IMPRESSION: 1. No acute findings. 2. Increase caliber of the common bile duct status post cholecystectomy. 3. Stable left adrenal nodule.  Prior right adrenalectomy. 4. Aortic atherosclerosis. Electronically Signed   By: TaKerby Moors.D.   On: 05/22/2016 13:58    ROS:  Pertinent items are  noted in HPI.  Blood pressure 95/64, pulse 94, temperature 98.5 F (36.9 C), temperature source Oral, resp. rate 20, height _0  (1.6 m), weight 128 lb (58.1 kg), SpO2 100 %. Physical Exam: Pleasant black female in  no acute distress. Head is normocephalic, atraumatic Neck is supple without lymphadenopathy Lungs clear auscultation with equal breath sounds bilaterally, no wheezing or rales noted Heart examination reveals a regular rate and rhythm without S3, S4, murmurs Abdomen is soft with multiple surgical scars present. A serosanguineous draining wound is noted to the left of the umbilicus. No erythema is present. The wound appears chronic in nature. Dr. Ledell Noss note reviewed  Assessment/Plan: Impression: Chronic abdominal wound, history of alcohol use, history of cocaine use, bipolar disorder.  I have tried to address this multiple times in the past, including surgical intervention as well as referral to plastic surgery. Given that this is chronic in nature, no acute surgical intervention is warranted. She was admitted for maroon-colored stools, which will be addressed. Would refer patient back to plastic surgery as an outpatient once her acute medical issues have resolved.  Aviva Signs 05/23/2016, 7:22 AM

## 2016-05-23 NOTE — H&P (View-Only) (Signed)
Referring Provider: Rosita Fire, MD Primary Care Physician:  Rosita Fire, MD Primary Gastroenterologist:  Garfield Cornea, MD  Reason for Consultation:  GI bleed  HPI: Angel Price is a 51 y.o. female with history of alcohol abuse, multiple episodes of acute pancreatitis, prior Billroth II, anemia of chronic disease, gastroparesis, GERD, chronic abdominal pain who presented to emergency department with complaints of melena and maroon colored stool for 2 days. Complained of abdominal pain. No lightheadedness or dizziness. Bleeding had decreased prior to admission. NO BM since admission. Rectal examination ED showed brown color stool with streaks of blood, I FOBT positive. Colonoscopy in October 2016 showed minimal internal hemorrhoids, pancolonic diverticulosis. EGD at that time showed prior hemigastrectomy, active oozing from the anastomotic suture site, hemostasis achieved.  1 month ago hemoglobin 10.8, 11.2 on presentation yesterday and down to 9.9 today. Ethyl alcohol level 17 on presentation. Sodium 126, BUN less than 5, creatinine 0.57, alkaline phosphatase 131, AST 90, ALT 30, total bilirubin 1.0. Urine drug screen negative.  Patient states her stools were black with purple blood. Takes MiraLAX as needed for constipation. No recent significant issues. She has had associated lower abdominal cramping, epigastric pain. Notes that she took Pepto-Bismol but she states this was after her stools are already black. She reports cutting back on alcohol. Doesn't have a taste for it. Drink a half a can of beer 2 days ago. None yesterday. In eyes NSAID or aspirin use. Takes pantoprazole 40 mg daily. She has frequent intermittent vomiting, Phenergan and Zofran doesn't help. No heartburn or dysphagia.  CT abdomen pelvis done yesterday with contrast showed increased CBD diameter from 9-1.1 cm over the past one month. Otherwise chronic stable findings.   Prior to Admission medications   Medication Sig  Start Date End Date Taking? Authorizing Provider  folic acid (FOLVITE) 1 MG tablet Take 1 tablet (1 mg total) by mouth daily. 10/08/15  Yes Rosita Fire, MD  lisinopril (PRINIVIL,ZESTRIL) 10 MG tablet Take 10 mg by mouth daily.   Yes Historical Provider, MD  ondansetron (ZOFRAN) 4 MG tablet TAKE (1) TABLET BY MOUTH EVERY SIX HOURS. 05/17/16  Yes Mahala Menghini, PA-C  pantoprazole (PROTONIX) 40 MG tablet Take 1 tablet (40 mg total) by  daily.  09/04/14  Yes Daleen Bo, MD            Current Facility-Administered Medications  Medication Dose Route Frequency Provider Last Rate Last Dose  . 0.9 %  sodium chloride infusion   Intravenous Once Erline Hau, MD   Stopped at 05/22/16 1730  . 0.9 %  sodium chloride infusion   Intravenous Continuous Erline Hau, MD 100 mL/hr at 05/22/16 2225    . feeding supplement (BOOST / RESOURCE BREEZE) liquid 1 Container  1 Container Oral TID BM Rosita Fire, MD   1 Container at 05/22/16 2021  . fentaNYL (SUBLIMAZE) injection 25-50 mcg  25-50 mcg Intravenous Q2H PRN Jeryl Columbia, NP   25 mcg at 05/23/16 0204  . folic acid (FOLVITE) tablet 1 mg  1 mg Oral Daily Estela Leonie Green, MD   1 mg at 05/22/16 1823  . multivitamin with minerals tablet 1 tablet  1 tablet Oral Daily Erline Hau, MD   1 tablet at 05/22/16 1823  . ondansetron (ZOFRAN) tablet 4 mg  4 mg Oral Q6H PRN Erline Hau, MD       Or  . ondansetron [facility]) injection 4 mg  4 mg Intravenous  Q6H PRN Erline Hau, MD   4 mg at 05/23/16 0204  . pantoprazole (PROTONIX) injection 40 mg  40 mg Intravenous Q12H Erline Hau, MD   40 mg at 05/22/16 2118  . thiamine (VITAMIN B-1) tablet 100 mg  100 mg Oral Daily Erline Hau, MD   100 mg at 05/22/16 1823    Allergies as of 05/22/2016 - Review Complete 05/22/2016  Allergen Reaction Noted  . Codeine Hives 02/06/2013  . Morphine and related Hives 02/06/2013     Past Medical History:  Diagnosis Date  . Abscess    soft tissue  . Adrenal mass ([place])   . Alcohol abuse   . Anxiety   . Blood transfusion without reported diagnosis   . Chronic abdominal pain   . Chronic wound infection of abdomen   . Colon polyp    colonoscopy 04/2014  . Depression   . Diverticulosis    colonoscopy 04/2014 moderat pan colonic  . Gastritis    EGD 05/2014  . Gastroparesis Nov 2015  . GERD (gastroesophageal reflux disease)   . Hemorrhoid    internal large  . Hiatal hernia   . History of Billroth II operation   . Hypertension   . Lung nodule    CT 02/2014 needs repeat 1 month  . Lung nodule < 6cm on CT 04/25/2014  . Lupus   . Nausea and vomiting    chronic, recurrent  . Schatzki's ring    patent per EGD 04/2014  . Sickle cell trait ([place])   . Suicide attempt   . 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 ABDOMINAL HYSTERECTOMY  2013   [place]  . ABDOMINAL SURGERY    . adrenal turmor removal    . AGILE CAPSULE N/A 01/05/2015   Procedure: AGILE CAPSULE;  Surgeon: Daneil Dolin, MD;  Location: [facility];  Service: Endoscopy;  Laterality: N/A;  0700  . Billroth II procedure      [place], first 2000, 2005/2006.  Marland Kitchen BIOPSY  05/20/2013   Procedure: BIOPSIES OF ASCENDING AND SIGMOID COLON;  Surgeon: Daneil Dolin, MD;  Location: [facility];  Service: Endoscopy;;  . BIOPSY  04/26/2014   Procedure: BIOPSIES;  Surgeon: Danie Binder, MD;  Location: [facility];  Service: Endoscopy;;  . CHOLECYSTECTOMY    . COLONOSCOPY     in [place]  . COLONOSCOPY WITH PROPOFOL N/A 05/20/2013   Dr.Rourk- inadequate prep, normal appearing rectum, grossly normal colon aside from pancolonic diverticula, normal terminal ileum bx= unremarkable colonic mucosa. Due for early interval 2016.   Marland Kitchen COLONOSCOPY WITH PROPOFOL N/A 04/26/2014   JOI:NOMVEH ileum/one colon polyp removed/moderate pan-colonic diverticulosis/large internal  hemorrhoids  . COLONOSCOPY WITH PROPOFOL N/A 12/23/2014   Dr.Rourk- minimal internal hemorrhoids, pancolonic diverticulosis  . DEBRIDEMENT OF ABDOMINAL WALL ABSCESS N/A 02/08/2013   Procedure: DEBRIDEMENT OF ABDOMINAL WALL ABSCESS;  Surgeon: Jamesetta So, MD;  Location: [facility];  Service: General;  Laterality: N/A;  . ESOPHAGOGASTRODUODENOSCOPY (EGD) WITH PROPOFOL N/A 05/20/2013   Dr.Rourk- s/p prior gastric surgery with normal esophagus, residual gastric mucosa and patent efferent limb  . ESOPHAGOGASTRODUODENOSCOPY (EGD) WITH PROPOFOL N/A 02/03/2014   Dr. Gala Romney:  s/p hemigastrectomy with retained gastric contents. Residual gastric mucosa and efferent limb appeared normal otherwise. Query gastroparesis.   Marland Kitchen ESOPHAGOGASTRODUODENOSCOPY (EGD) WITH PROPOFOL N/A 04/26/2014   MCN:[id number]'M ring/small HH/mild non-erosive gasrtitis/normal anastomosis  . ESOPHAGOGASTRODUODENOSCOPY (EGD) WITH  PROPOFOL N/A 12/23/2014   Dr.Rourk- s/p prior hemigastrctomy, active oozing from anastomotic suture site, hemostasis achieved  . tendon repar Right    wrist  . WOUND EXPLORATION Right 06/24/2013   Procedure: exploration of traumatic wound right wrist;  Surgeon: Tennis Must, MD;  Location: [place];  Service: Orthopedics;  Laterality: Right;    Family History  Problem Relation Age of Onset  . Brain cancer Son   . Schizophrenia Son   . Cancer Son     brain  . Lung cancer Father   . Cancer Father     mets  . Drug abuse Mother   . Breast cancer Maternal Aunt   . Bipolar disorder Maternal Aunt   . Drug abuse Maternal Aunt   . Colon cancer Maternal Grandmother     late 50s, early 85s  . Drug abuse Sister   . Drug abuse Brother   . Bipolar disorder Paternal Grandfather   . Bipolar disorder Cousin   . Liver disease Neg Hx     Social History   Social History  . Marital status: Legally Separated    Spouse name: N/A  . Number of children: N/A  . Years of education: N/A   Occupational History  . Not on  file.   Social History Main Topics  . Smoking status: Current Every Day Smoker    Packs/day: 0.50    Years: 35.00    Types: Cigarettes  . Smokeless tobacco: Never Used  . Alcohol use 7.2 oz/week    12 Cans of beer per week     Comment: 5 12 oz beers a day  . Drug use: No     Comment: quit 09/2012  . Sexual activity: Yes    Birth control/ protection: Surgical   Other Topics Concern  . Not on file   Social History Narrative  . No narrative on file     ROS:  General: Negative for anorexia, weight loss, fever, chills, fatigue, weakness. Eyes: Negative for vision changes.  ENT: Negative for hoarseness, difficulty swallowing , nasal congestion. CV: Negative for chest pain, angina, palpitations, dyspnea on exertion, peripheral edema.  Respiratory: Negative for dyspnea at rest, dyspnea on exertion, cough, sputum, wheezing.  GI: See history of present illness. GU:  Negative for dysuria, hematuria, urinary incontinence, urinary frequency, nocturnal urination.  MS: Negative for joint pain, low back pain.  Derm: Negative for rash or itching.  Neuro: Negative for weakness, abnormal sensation, seizure, frequent headaches, memory loss, confusion.  Psych: Negative for anxiety, depression, suicidal ideation, hallucinations.  Endo: Negative for unusual weight change.  Heme: Negative for bruising or bleeding. Allergy: Negative for rash or hives.       Physical Examination: Vital signs in last 24 hours: Temp:  [98 F (36.7 C)-98.5 F (36.9 C)] 98 F (36.7 C) (03/08 0645) Pulse Rate:  [80-98] 87 (03/08 0645) Resp:  [16-25] 16 (03/08 0645) BP: (95-116)/(64-91) 105/66 (03/08 0645) SpO2:  [93 %-100 %] 100 % (03/08 0645) Weight:  [128 lb (58.1 kg)-129 lb (58.5 kg)] 128 lb (58.1 kg) (03/07 1606) Last BM Date: 05/22/16  General: Well-nourished, well-developed in no acute distress.  Head: Normocephalic, atraumatic.   Eyes: Conjunctiva pink, no icterus. Mouth: Oropharyngeal mucosa moist  and pink , no lesions erythema or exudate. Neck: Supple without thyromegaly, masses, or lymphadenopathy.  Lungs: Clear to auscultation bilaterally.  Heart: Regular rate and rhythm, no murmurs rubs or gallops.  Abdomen: Bowel sounds are normal, moderate epigastric tenderness , nondistended,  no hepatosplenomegaly or masses, no abdominal bruits or    hernia , no rebound or guarding.  Bandage over central abdomen at site of chronic drainage with malodorous smell.  Rectal: Done in the ED, per records heme positive stool, internal hemorrhoids noted, old external hemorrhoids not thrombosed, no rectal mass, stool with some slight streaking of red blood. Stool was not black in color. Extremities: No lower extremity edema, clubbing, deformity.  Neuro: Alert and oriented x 4 , grossly normal neurologically.  Skin: Warm and dry, no rash or jaundice.   Psych: Alert and cooperative, normal mood and affect.        Intake/Output from previous day: 03/07 0701 - 03/08 0700 In: 498.3 [I.V.:498.3] Out: 300 [Urine:300] Intake/Output this shift: No intake/output data recorded.  Lab Results: CBC  Recent Labs  05/22/16 1206 05/22/16 1737 05/23/16 0143  WBC 4.5 4.3 4.6  HGB 11.2* 11.1* 9.9*  HCT 32.4* 31.6* 28.8*  MCV 84.8 85.2 86.5  PLT 184 155 164   BMET  Recent Labs  05/22/16 1206 05/23/16 0143  NA 126* 131*  K 4.4 3.9  CL 94* 101  CO2 19* 23  GLUCOSE 70 90  BUN <5* <5*  CREATININE 0.57 0.66  CALCIUM 8.8* 8.5*   LFT  Recent Labs  05/22/16 1206  BILITOT 1.0  ALKPHOS 131*  AST 90*  ALT 30  PROT 7.4  ALBUMIN 3.6    Lipase  Recent Labs  05/22/16 1206  LIPASE <10*    PT/INR No results for input(s): LABPROT, INR in the last 72 hours.    Imaging Studies: Dg Chest 2 View  Result Date: 05/22/2016 CLINICAL DATA:  Abdominal pain, chest pain, vomiting for 4 days pound EXAM: CHEST  2 VIEW COMPARISON:  04/25/2016 FINDINGS: Cardiomediastinal silhouette is stable. No infiltrate or  pleural effusion. No pulmonary edema. Again noted multiple surgical clips in the upper and mid abdomen. IMPRESSION: No active cardiopulmonary disease. Electronically Signed   By: Lahoma Crocker M.D.   On: 05/22/2016 13:53   Ct Abdomen Pelvis W Contrast  Result Date: 05/22/2016 CLINICAL DATA:  Abdominal pain and rectal bleeding. EXAM: CT ABDOMEN AND PELVIS WITH CONTRAST TECHNIQUE: Multidetector CT imaging of the abdomen and pelvis was performed using the standard protocol following bolus administration of intravenous contrast. CONTRAST:  34mL ISOVUE-300 IOPAMIDOL (ISOVUE-300) INJECTION 61%, 153mL ISOVUE-300 IOPAMIDOL (ISOVUE-300) INJECTION 61% COMPARISON:  04/25/2016 FINDINGS: Lower chest: No acute abnormality. Hepatobiliary: Hepatic steatosis. Previous cholecystectomy. Increase caliber of the common bile duct measures 1.1 cm, image 36 of series 4. Previously 9 mm. No obstructing stone or mass noted. Pancreas: Unremarkable. No pancreatic ductal dilatation or surrounding inflammatory changes. Spleen: Normal in size without focal abnormality. Adrenals/Urinary Tract: Previous right adrenalectomy. Nodule in the left adrenal gland measures 2 cm, image number 20 of series 2. This is unchanged from the previous exam. Previously described as representing a benign adenoma. Unremarkable appearance of both kidneys. Urinary bladder appears normal. Stomach/Bowel: The stomach is normal. The small bowel loops have a normal course and caliber. No pathologic dilatation of the colon. Vascular/Lymphatic: Aortic atherosclerosis. No enlarged upper abdominal or pelvic lymph nodes. Reproductive: Status post hysterectomy. No adnexal masses. Other: Chronic scarring and postsurgical changes involving the ventral abdominal wall. Musculoskeletal: No aggressive lytic or sclerotic bone lesions. IMPRESSION: 1. No acute findings. 2. Increase caliber of the common bile duct status post cholecystectomy. 3. Stable left adrenal nodule.  Prior right  adrenalectomy. 4. Aortic atherosclerosis. Electronically Signed   By: Kerby Moors  M.D.   On: 05/22/2016 13:58   Ct Abdomen Pelvis W Contrast  Result Date: 04/25/2016 CLINICAL DATA:  Constipation for 1 week. Nausea and vomiting. History of hysterectomy, cholecystectomy, hiatal hernia, lupus, diverticulitis, Billroth 2. EXAM: CT ABDOMEN AND PELVIS WITH CONTRAST TECHNIQUE: Multidetector CT imaging of the abdomen and pelvis was performed using the standard protocol following bolus administration of intravenous contrast. CONTRAST:  176mL ISOVUE-300 IOPAMIDOL (ISOVUE-300) INJECTION 61% COMPARISON:  Abdominal radiographs April 25, 2016 and CT abdomen and pelvis October 01, 2015 FINDINGS: LOWER CHEST: Lingular scarring and dependent atelectasis. Heart size is normal. No pericardial effusions. Status post fundoplication and gastrojejunostomy. HEPATOBILIARY: The liver is diffusely hypodense compatible with steatosis. Status post cholecystectomy. PANCREAS: Normal. SPLEEN: Normal. ADRENALS/URINARY TRACT: Kidneys are orthotopic, demonstrating symmetric enhancement. No nephrolithiasis, hydronephrosis or solid renal masses. The unopacified ureters are normal in course and caliber. Delayed imaging through the kidneys demonstrates symmetric prompt contrast excretion within the proximal urinary collecting system. Urinary bladder is partially distended and unremarkable. 2.1 cm benign LEFT adrenal adenoma (8 Hounsfield units). Status post apparent RIGHT adrenalectomy. STOMACH/BOWEL: RIGHT lower quadrant small bowel anastomosis with small bowel feces compatible with chronic stasis. Moderate retained large bowel stool without bowel obstruction. Small appendix versus residual stump. VASCULAR/LYMPHATIC: Aortoiliac vessels are normal in course and caliber, mild calcific atherosclerosis. No lymphadenopathy by CT size criteria. REPRODUCTIVE: Status post hysterectomy. OTHER: Trace free fluid in the pelvis, likely physiologic. No focal  fluid collection or intraperitoneal free air. Phleboliths in the pelvis. MUSCULOSKELETAL: Unchanged of tear abdominal wall scarring. IMPRESSION: No acute intra-abdominal or pelvic process. Extensive postsurgical changes of the abdomen. Moderate large bowel stool without bowel obstruction. Electronically Signed   By: Elon Alas M.D.   On: 04/25/2016 14:43   Dg Abd Acute W/chest  Result Date: 04/25/2016 CLINICAL DATA:  Upper abdominal pain EXAM: DG ABDOMEN ACUTE W/ 1V CHEST COMPARISON:  04/19/2016 FINDINGS: Cardiac shadow is stable. Lungs are well aerated bilaterally. Mild scarring is noted in the right mid lung better visualized than on the prior exam due to some projectional differences. The abdomen shows a nonobstructive bowel gas pattern. Fecal material is noted throughout the colon but decreased from the prior study. Postsurgical changes are seen. No acute bony abnormality is noted. No free air is seen. IMPRESSION: Decrease in the degree of fecal material within the colon. No obstructive changes are seen. No acute abnormality is noted in the chest. Electronically Signed   By: Inez Catalina M.D.   On: 04/25/2016 12:15  [4 week]   Impression: 51 year old female with past medical history significant for recurrent pancreatitis, alcohol abuse, prior Billroth II, anemia of chronic disease, gastroparesis, GERD, chronic abdominal pain presenting with 2 day history of reported melena/maroon color stool associated with epigastric pain, lower abdominal cramping. Continues with intermittent vomiting as an outpatient. New finding of increasing diameter of CBD, 1.1 cm now (AST elevated/ALT tbili, alkphos all normal most c/w etoh hepatitis picture).  Patient has had mild drop in her hemoglobin overnight, 1 g/dL. She denies having any BM since admission. Hemodynamically has been stable.  Plan: 1. Consider upper endoscopy given patient's reported presentation (melena), epigastric pain, and prior history of  anastomotic bleeding. To discuss further with Dr. Oneida Alar. May not require colonoscopy unless documented significant overt lower GI bleeding. 2. Continue PPI twice a day. 3. Encouraged alcohol cessation. 4. Consider further evaluation of dilated CBD as outpatient.   We would like to thank you for the opportunity to participate in the care of  Angel Price.  Laureen Ochs. Bernarda Caffey [facility] [phone] 3/8/20189:38 AM     LOS: 0 days

## 2016-05-23 NOTE — Interval H&P Note (Signed)
History and Physical Interval Note:  05/23/2016 11:58 AM  Angel Price  has presented today for surgery, with the diagnosis of melena, maroon stools, epigastric pain  The various methods of treatment have been discussed with the patient and family. After consideration of risks, benefits and other options for treatment, the patient has consented to  Procedure(s): ESOPHAGOGASTRODUODENOSCOPY (EGD) WITH PROPOFOL (N/A) as a surgical intervention .  The patient's history has been reviewed, patient examined, no change in status, stable for surgery.  I have reviewed the patient's chart and labs.  Questions were answered to the patient's satisfaction.     [employer]

## 2016-05-23 NOTE — Progress Notes (Signed)
Subjective: Patient was admitted due to rectal bleeding. There is mild drop in hemoglobin. 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 Arnoldo Morale.  Objective: Vital signs in last 24 hours: Temp:  [98 F (36.7 C)-98.5 F (36.9 C)] 98 F (36.7 C) (03/08 0645) Pulse Rate:  [80-98] 87 (03/08 0645) Resp:  [16-25] 16 (03/08 0645) BP: (95-116)/(64-91) 105/66 (03/08 0645) SpO2:  [93 %-100 %] 100 % (03/08 0645) Weight:  [58.1 kg (128 lb)-58.5 kg (129 lb)] 58.1 kg (128 lb) (03/07 1606) Weight change:  Last BM Date: 05/22/16  Intake/Output from previous day: 03/07 0701 - 03/08 0700 In: 498.3 [I.V.:498.3] Out: 300 [Urine:300]  PHYSICAL EXAM General appearance: alert and no distress Resp: clear to auscultation bilaterally Cardio: S1, S2 normal GI: dressed abdominal wall wound Extremities: extremities normal, atraumatic, no cyanosis or edema  Lab Results:  Results for orders placed or performed during the hospital encounter of 05/22/16 (from the past 48 hour(s))  Rapid urine drug screen (hospital performed)     Status: None   Collection Time: 05/22/16 11:38 AM  Result Value Ref Range   Opiates NONE DETECTED NONE DETECTED   Cocaine NONE DETECTED NONE DETECTED   Benzodiazepines NONE DETECTED NONE DETECTED   Amphetamines NONE DETECTED NONE DETECTED   Tetrahydrocannabinol NONE DETECTED NONE DETECTED   Barbiturates NONE DETECTED NONE DETECTED    Comment:        DRUG SCREEN FOR MEDICAL PURPOSES ONLY.  IF CONFIRMATION IS NEEDED FOR ANY PURPOSE, NOTIFY LAB WITHIN 5 DAYS.        LOWEST DETECTABLE LIMITS FOR URINE DRUG SCREEN Drug Class       Cutoff (ng/mL) Amphetamine      1000 Barbiturate      200 Benzodiazepine   741 Tricyclics       287 Opiates          300 Cocaine          300 THC              50   Comprehensive metabolic panel     Status: Abnormal   Collection Time: 05/22/16 12:06 PM  Result Value Ref Range   Sodium 126 (L) 135 - 145 mmol/L    Potassium 4.4 3.5 - 5.1 mmol/L   Chloride 94 (L) 101 - 111 mmol/L   CO2 19 (L) 22 - 32 mmol/L   Glucose, Bld 70 65 - 99 mg/dL   BUN <5 (L) 6 - 20 mg/dL   Creatinine, Ser 0.57 0.44 - 1.00 mg/dL   Calcium 8.8 (L) 8.9 - 10.3 mg/dL   Total Protein 7.4 6.5 - 8.1 g/dL   Albumin 3.6 3.5 - 5.0 g/dL   AST 90 (H) 15 - 41 U/L   ALT 30 14 - 54 U/L   Alkaline Phosphatase 131 (H) 38 - 126 U/L   Total Bilirubin 1.0 0.3 - 1.2 mg/dL   GFR calc non Af Amer >60 >60 mL/min   GFR calc Af Amer >60 >60 mL/min    Comment: (NOTE) The eGFR has been calculated using the CKD EPI equation. This calculation has not been validated in all clinical situations. eGFR's persistently <60 mL/min signify possible Chronic Kidney Disease.    Anion gap 13 5 - 15  Lipase, blood     Status: Abnormal   Collection Time: 05/22/16 12:06 PM  Result Value Ref Range   Lipase <10 (L) 11 - 51 U/L  Ethanol     Status: Abnormal  Collection Time: 05/22/16 12:06 PM  Result Value Ref Range   Alcohol, Ethyl (B) 17 (H) <5 mg/dL    Comment:        LOWEST DETECTABLE LIMIT FOR SERUM ALCOHOL IS 5 mg/dL FOR MEDICAL PURPOSES ONLY   CBC with Differential/Platelet     Status: Abnormal   Collection Time: 05/22/16 12:06 PM  Result Value Ref Range   WBC 4.5 4.0 - 10.5 K/uL   RBC 3.82 (L) 3.87 - 5.11 MIL/uL   Hemoglobin 11.2 (L) 12.0 - 15.0 g/dL   HCT 32.4 (L) 36.0 - 46.0 %   MCV 84.8 78.0 - 100.0 fL   MCH 29.3 26.0 - 34.0 pg   MCHC 34.6 30.0 - 36.0 g/dL   RDW 14.3 11.5 - 15.5 %   Platelets 184 150 - 400 K/uL   Neutrophils Relative % 53 %   Neutro Abs 2.4 1.7 - 7.7 K/uL   Lymphocytes Relative 37 %   Lymphs Abs 1.7 0.7 - 4.0 K/uL   Monocytes Relative 10 %   Monocytes Absolute 0.4 0.1 - 1.0 K/uL   Eosinophils Relative 0 %   Eosinophils Absolute 0.0 0.0 - 0.7 K/uL   Basophils Relative 0 %   Basophils Absolute 0.0 0.0 - 0.1 K/uL  Troponin I     Status: None   Collection Time: 05/22/16 12:06 PM  Result Value Ref Range   Troponin I  <0.03 <0.03 ng/mL  POC occult blood, ED Provider will collect     Status: Abnormal   Collection Time: 05/22/16  2:31 PM  Result Value Ref Range   Fecal Occult Bld POSITIVE (A) NEGATIVE  MRSA PCR Screening     Status: None   Collection Time: 05/22/16  4:15 PM  Result Value Ref Range   MRSA by PCR NEGATIVE NEGATIVE    Comment:        The GeneXpert MRSA Assay (FDA approved for NASAL specimens only), is one component of a comprehensive MRSA colonization surveillance program. It is not intended to diagnose MRSA infection nor to guide or monitor treatment for MRSA infections.   HIV antibody (Routine Testing)     Status: None   Collection Time: 05/22/16  5:37 PM  Result Value Ref Range   HIV Screen 4th Generation wRfx Non Reactive Non Reactive    Comment: (NOTE) Performed At: [facility] [place], [place] [id number] Lindon Romp MD WU:[id number]   Type and screen [facility]     Status: None (Preliminary result)   Collection Time: 05/22/16  5:37 PM  Result Value Ref Range   ABO/RH(D) O POS    Antibody Screen NEG    Sample Expiration 05/25/2016    Unit Number [id number]    Blood Component Type RBC LR PHER1    Unit division 00    Status of Unit ALLOCATED    Transfusion Status OK TO TRANSFUSE    Crossmatch Result Compatible    Unit Number [id number]    Blood Component Type RBC LR PHER2    Unit division 00    Status of Unit ALLOCATED    Transfusion Status OK TO TRANSFUSE    Crossmatch Result Compatible   CBC     Status: Abnormal   Collection Time: 05/22/16  5:37 PM  Result Value Ref Range   WBC 4.3 4.0 - 10.5 K/uL   RBC 3.71 (L) 3.87 - 5.11 MIL/uL   Hemoglobin 11.1 (L) 12.0 - 15.0 g/dL   HCT 31.6 (L) 36.0 -  46.0 %   MCV 85.2 78.0 - 100.0 fL   MCH 29.9 26.0 - 34.0 pg   MCHC 35.1 30.0 - 36.0 g/dL   RDW 14.4 11.5 - 15.5 %   Platelets 155 150 - 400 K/uL  Prepare RBC     Status: None   Collection Time: 05/22/16  5:38 PM  Result  Value Ref Range   Order Confirmation ORDER PROCESSED BY BLOOD BANK   Basic metabolic panel     Status: Abnormal   Collection Time: 05/23/16  1:43 AM  Result Value Ref Range   Sodium 131 (L) 135 - 145 mmol/L   Potassium 3.9 3.5 - 5.1 mmol/L   Chloride 101 101 - 111 mmol/L   CO2 23 22 - 32 mmol/L   Glucose, Bld 90 65 - 99 mg/dL   BUN <5 (L) 6 - 20 mg/dL   Creatinine, Ser 0.66 0.44 - 1.00 mg/dL   Calcium 8.5 (L) 8.9 - 10.3 mg/dL   GFR calc non Af Amer >60 >60 mL/min   GFR calc Af Amer >60 >60 mL/min    Comment: (NOTE) The eGFR has been calculated using the CKD EPI equation. This calculation has not been validated in all clinical situations. eGFR's persistently <60 mL/min signify possible Chronic Kidney Disease.    Anion gap 7 5 - 15  CBC     Status: Abnormal   Collection Time: 05/23/16  1:43 AM  Result Value Ref Range   WBC 4.6 4.0 - 10.5 K/uL   RBC 3.33 (L) 3.87 - 5.11 MIL/uL   Hemoglobin 9.9 (L) 12.0 - 15.0 g/dL   HCT 28.8 (L) 36.0 - 46.0 %   MCV 86.5 78.0 - 100.0 fL   MCH 29.7 26.0 - 34.0 pg   MCHC 34.4 30.0 - 36.0 g/dL   RDW 14.3 11.5 - 15.5 %   Platelets 164 150 - 400 K/uL    ABGS No results for input(s): PHART, PO2ART, TCO2, HCO3 in the last 72 hours.  Invalid input(s): PCO2 CULTURES Recent Results (from the past 240 hour(s))  MRSA PCR Screening     Status: None   Collection Time: 05/22/16  4:15 PM  Result Value Ref Range Status   MRSA by PCR NEGATIVE NEGATIVE Final    Comment:        The GeneXpert MRSA Assay (FDA approved for NASAL specimens only), is one component of a comprehensive MRSA colonization surveillance program. It is not intended to diagnose MRSA infection nor to guide or monitor treatment for MRSA infections.    Studies/Results: Dg Chest 2 View  Result Date: 05/22/2016 CLINICAL DATA:  Abdominal pain, chest pain, vomiting for 4 days pound EXAM: CHEST  2 VIEW COMPARISON:  04/25/2016 FINDINGS: Cardiomediastinal silhouette is stable. No  infiltrate or pleural effusion. No pulmonary edema. Again noted multiple surgical clips in the upper and mid abdomen. IMPRESSION: No active cardiopulmonary disease. Electronically Signed   By: Lahoma Crocker M.D.   On: 05/22/2016 13:53   Ct Abdomen Pelvis W Contrast  Result Date: 05/22/2016 CLINICAL DATA:  Abdominal pain and rectal bleeding. EXAM: CT ABDOMEN AND PELVIS WITH CONTRAST TECHNIQUE: Multidetector CT imaging of the abdomen and pelvis was performed using the standard protocol following bolus administration of intravenous contrast. CONTRAST:  41m ISOVUE-300 IOPAMIDOL (ISOVUE-300) INJECTION 61%, 1057mISOVUE-300 IOPAMIDOL (ISOVUE-300) INJECTION 61% COMPARISON:  04/25/2016 FINDINGS: Lower chest: No acute abnormality. Hepatobiliary: Hepatic steatosis. Previous cholecystectomy. Increase caliber of the common bile duct measures 1.1 cm, image 36 of series 4.  Previously 9 mm. No obstructing stone or mass noted. Pancreas: Unremarkable. No pancreatic ductal dilatation or surrounding inflammatory changes. Spleen: Normal in size without focal abnormality. Adrenals/Urinary Tract: Previous right adrenalectomy. Nodule in the left adrenal gland measures 2 cm, image number 20 of series 2. This is unchanged from the previous exam. Previously described as representing a benign adenoma. Unremarkable appearance of both kidneys. Urinary bladder appears normal. Stomach/Bowel: The stomach is normal. The small bowel loops have a normal course and caliber. No pathologic dilatation of the colon. Vascular/Lymphatic: Aortic atherosclerosis. No enlarged upper abdominal or pelvic lymph nodes. Reproductive: Status post hysterectomy. No adnexal masses. Other: Chronic scarring and postsurgical changes involving the ventral abdominal wall. Musculoskeletal: No aggressive lytic or sclerotic bone lesions. IMPRESSION: 1. No acute findings. 2. Increase caliber of the common bile duct status post cholecystectomy. 3. Stable left adrenal nodule.   Prior right adrenalectomy. 4. Aortic atherosclerosis. Electronically Signed   By: Kerby Moors M.D.   On: 05/22/2016 13:58    Medications: I have reviewed the patient's current medications.  Assesment:  Principal Problem:   GI bleed Active Problems:   History of cocaine abuse   History of schizoaffective disorder   Bipolar disorder ([place])   Personality disorder   Tobacco abuse   Alcohol abuse   Abdominal wall abscess    Plan:  Medications reviewed Will continue PPI Will monitor CBC GI consult    LOS: 0 days   Sierah Lacewell 05/23/2016, 8:21 AM

## 2016-05-23 NOTE — Consult Note (Signed)
Referring Provider: Rosita Fire, MD Primary Care Physician:  Rosita Fire, MD Primary Gastroenterologist:  Garfield Cornea, MD  Reason for Consultation:  GI bleed  HPI: Angel Price is a 51 y.o. female with history of alcohol abuse, multiple episodes of acute pancreatitis, prior Billroth II, anemia of chronic disease, gastroparesis, GERD, chronic abdominal pain who presented to emergency department with complaints of melena and maroon colored stool for 2 days. Complained of abdominal pain. No lightheadedness or dizziness. Bleeding had decreased prior to admission. NO BM since admission. Rectal examination ED showed brown color stool with streaks of blood, I FOBT positive. Colonoscopy in October 2016 showed minimal internal hemorrhoids, pancolonic diverticulosis. EGD at that time showed prior hemigastrectomy, active oozing from the anastomotic suture site, hemostasis achieved.  1 month ago hemoglobin 10.8, 11.2 on presentation yesterday and down to 9.9 today. Ethyl alcohol level 17 on presentation. Sodium 126, BUN less than 5, creatinine 0.57, alkaline phosphatase 131, AST 90, ALT 30, total bilirubin 1.0. Urine drug screen negative.  Patient states her stools were black with purple blood. Takes MiraLAX as needed for constipation. No recent significant issues. She has had associated lower abdominal cramping, epigastric pain. Notes that she took Pepto-Bismol but she states this was after her stools are already black. She reports cutting back on alcohol. Doesn't have a taste for it. Drink a half a can of beer 2 days ago. None yesterday. In eyes NSAID or aspirin use. Takes pantoprazole 40 mg daily. She has frequent intermittent vomiting, Phenergan and Zofran doesn't help. No heartburn or dysphagia.  CT abdomen pelvis done yesterday with contrast showed increased CBD diameter from 9-1.1 cm over the past one month. Otherwise chronic stable findings.   Prior to Admission medications   Medication Sig  Start Date End Date Taking? Authorizing Provider  folic acid (FOLVITE) 1 MG tablet Take 1 tablet (1 mg total) by mouth daily. 10/08/15  Yes Rosita Fire, MD  lisinopril (PRINIVIL,ZESTRIL) 10 MG tablet Take 10 mg by mouth daily.   Yes Historical Provider, MD  ondansetron (ZOFRAN) 4 MG tablet TAKE (1) TABLET BY MOUTH EVERY SIX HOURS. 05/17/16  Yes Mahala Menghini, PA-C  pantoprazole (PROTONIX) 40 MG tablet Take 1 tablet (40 mg total) by  daily.  09/04/14  Yes Daleen Bo, MD            Current Facility-Administered Medications  Medication Dose Route Frequency Provider Last Rate Last Dose  . 0.9 %  sodium chloride infusion   Intravenous Once Erline Hau, MD   Stopped at 05/22/16 1730  . 0.9 %  sodium chloride infusion   Intravenous Continuous Erline Hau, MD 100 mL/hr at 05/22/16 2225    . feeding supplement (BOOST / RESOURCE BREEZE) liquid 1 Container  1 Container Oral TID BM Rosita Fire, MD   1 Container at 05/22/16 2021  . fentaNYL (SUBLIMAZE) injection 25-50 mcg  25-50 mcg Intravenous Q2H PRN Jeryl Columbia, NP   25 mcg at 05/23/16 0204  . folic acid (FOLVITE) tablet 1 mg  1 mg Oral Daily Estela Leonie Green, MD   1 mg at 05/22/16 1823  . multivitamin with minerals tablet 1 tablet  1 tablet Oral Daily Erline Hau, MD   1 tablet at 05/22/16 1823  . ondansetron (ZOFRAN) tablet 4 mg  4 mg Oral Q6H PRN Erline Hau, MD       Or  . ondansetron [facility]) injection 4 mg  4 mg Intravenous  Q6H PRN Erline Hau, MD   4 mg at 05/23/16 0204  . pantoprazole (PROTONIX) injection 40 mg  40 mg Intravenous Q12H Erline Hau, MD   40 mg at 05/22/16 2118  . thiamine (VITAMIN B-1) tablet 100 mg  100 mg Oral Daily Erline Hau, MD   100 mg at 05/22/16 1823    Allergies as of 05/22/2016 - Review Complete 05/22/2016  Allergen Reaction Noted  . Codeine Hives 02/06/2013  . Morphine and related Hives 02/06/2013     Past Medical History:  Diagnosis Date  . Abscess    soft tissue  . Adrenal mass ([place])   . Alcohol abuse   . Anxiety   . Blood transfusion without reported diagnosis   . Chronic abdominal pain   . Chronic wound infection of abdomen   . Colon polyp    colonoscopy 04/2014  . Depression   . Diverticulosis    colonoscopy 04/2014 moderat pan colonic  . Gastritis    EGD 05/2014  . Gastroparesis Nov 2015  . GERD (gastroesophageal reflux disease)   . Hemorrhoid    internal large  . Hiatal hernia   . History of Billroth II operation   . Hypertension   . Lung nodule    CT 02/2014 needs repeat 1 month  . Lung nodule < 6cm on CT 04/25/2014  . Lupus   . Nausea and vomiting    chronic, recurrent  . Schatzki's ring    patent per EGD 04/2014  . Sickle cell trait ([place])   . Suicide attempt   . 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 ABDOMINAL HYSTERECTOMY  2013   [place]  . ABDOMINAL SURGERY    . adrenal turmor removal    . AGILE CAPSULE N/A 01/05/2015   Procedure: AGILE CAPSULE;  Surgeon: Daneil Dolin, MD;  Location: [facility];  Service: Endoscopy;  Laterality: N/A;  0700  . Billroth II procedure      [place], first 2000, 2005/2006.  Marland Kitchen BIOPSY  05/20/2013   Procedure: BIOPSIES OF ASCENDING AND SIGMOID COLON;  Surgeon: Daneil Dolin, MD;  Location: [facility] OR[facility];  Service: Endoscopy;;  . BIOPSY  04/26/2014   Procedure: BIOPSIES;  Surgeon: Danie Binder, MD;  Location: [facility];  Service: Endoscopy;;  . CHOLECYSTECTOMY    . COLONOSCOPY     in [place]  . COLONOSCOPY WITH PROPOFOL N/A 05/20/2013   Dr.Rourk- inadequate prep, normal appearing rectum, grossly normal colon aside from pancolonic diverticula, normal terminal ileum bx= unremarkable colonic mucosa. Due for early interval 2016.   Marland Kitchen COLONOSCOPY WITH PROPOFOL N/A 04/26/2014   RCV:ELFYBO ileum/one colon polyp removed/moderate pan-colonic diverticulosis/large internal  hemorrhoids  . COLONOSCOPY WITH PROPOFOL N/A 12/23/2014   Dr.Rourk- minimal internal hemorrhoids, pancolonic diverticulosis  . DEBRIDEMENT OF ABDOMINAL WALL ABSCESS N/A 02/08/2013   Procedure: DEBRIDEMENT OF ABDOMINAL WALL ABSCESS;  Surgeon: Jamesetta So, MD;  Location: [facility];  Service: General;  Laterality: N/A;  . ESOPHAGOGASTRODUODENOSCOPY (EGD) WITH PROPOFOL N/A 05/20/2013   Dr.Rourk- s/p prior gastric surgery with normal esophagus, residual gastric mucosa and patent efferent limb  . ESOPHAGOGASTRODUODENOSCOPY (EGD) WITH PROPOFOL N/A 02/03/2014   Dr. Gala Romney:  s/p hemigastrectomy with retained gastric contents. Residual gastric mucosa and efferent limb appeared normal otherwise. Query gastroparesis.   Marland Kitchen ESOPHAGOGASTRODUODENOSCOPY (EGD) WITH PROPOFOL N/A 04/26/2014   FBP:ZWCHENID'P ring/small HH/mild non-erosive gasrtitis/normal anastomosis  . ESOPHAGOGASTRODUODENOSCOPY (EGD) WITH  PROPOFOL N/A 12/23/2014   Dr.Rourk- s/p prior hemigastrctomy, active oozing from anastomotic suture site, hemostasis achieved  . tendon repar Right    wrist  . WOUND EXPLORATION Right 06/24/2013   Procedure: exploration of traumatic wound right wrist;  Surgeon: Tennis Must, MD;  Location: [facility];  Service: Orthopedics;  Laterality: Right;    Family History  Problem Relation Age of Onset  . Brain cancer Son   . Schizophrenia Son   . Cancer Son     brain  . Lung cancer Father   . Cancer Father     mets  . Drug abuse Mother   . Breast cancer Maternal Aunt   . Bipolar disorder Maternal Aunt   . Drug abuse Maternal Aunt   . Colon cancer Maternal Grandmother     late 62s, early 46s  . Drug abuse Sister   . Drug abuse Brother   . Bipolar disorder Paternal Grandfather   . Bipolar disorder Cousin   . Liver disease Neg Hx     Social History   Social History  . Marital status: Legally Separated    Spouse name: N/A  . Number of children: N/A  . Years of education: N/A   Occupational History  . Not on  file.   Social History Main Topics  . Smoking status: Current Every Day Smoker    Packs/day: 0.50    Years: 35.00    Types: Cigarettes  . Smokeless tobacco: Never Used  . Alcohol use 7.2 oz/week    12 Cans of beer per week     Comment: 5 12 oz beers a day  . Drug use: No     Comment: quit 09/2012  . Sexual activity: Yes    Birth control/ protection: Surgical   Other Topics Concern  . Not on file   Social History Narrative  . No narrative on file     ROS:  General: Negative for anorexia, weight loss, fever, chills, fatigue, weakness. Eyes: Negative for vision changes.  ENT: Negative for hoarseness, difficulty swallowing , nasal congestion. CV: Negative for chest pain, angina, palpitations, dyspnea on exertion, peripheral edema.  Respiratory: Negative for dyspnea at rest, dyspnea on exertion, cough, sputum, wheezing.  GI: See history of present illness. GU:  Negative for dysuria, hematuria, urinary incontinence, urinary frequency, nocturnal urination.  MS: Negative for joint pain, low back pain.  Derm: Negative for rash or itching.  Neuro: Negative for weakness, abnormal sensation, seizure, frequent headaches, memory loss, confusion.  Psych: Negative for anxiety, depression, suicidal ideation, hallucinations.  Endo: Negative for unusual weight change.  Heme: Negative for bruising or bleeding. Allergy: Negative for rash or hives.       Physical Examination: Vital signs in last 24 hours: Temp:  [98 F (36.7 C)-98.5 F (36.9 C)] 98 F (36.7 C) (03/08 0645) Pulse Rate:  [80-98] 87 (03/08 0645) Resp:  [16-25] 16 (03/08 0645) BP: (95-116)/(64-91) 105/66 (03/08 0645) SpO2:  [93 %-100 %] 100 % (03/08 0645) Weight:  [128 lb (58.1 kg)-129 lb (58.5 kg)] 128 lb (58.1 kg) (03/07 1606) Last BM Date: 05/22/16  General: Well-nourished, well-developed in no acute distress.  Head: Normocephalic, atraumatic.   Eyes: Conjunctiva pink, no icterus. Mouth: Oropharyngeal mucosa moist  and pink , no lesions erythema or exudate. Neck: Supple without thyromegaly, masses, or lymphadenopathy.  Lungs: Clear to auscultation bilaterally.  Heart: Regular rate and rhythm, no murmurs rubs or gallops.  Abdomen: Bowel sounds are normal, moderate epigastric tenderness , nondistended,  no hepatosplenomegaly or masses, no abdominal bruits or    hernia , no rebound or guarding.  Bandage over central abdomen at site of chronic drainage with malodorous smell.  Rectal: Done in the ED, per records heme positive stool, internal hemorrhoids noted, old external hemorrhoids not thrombosed, no rectal mass, stool with some slight streaking of red blood. Stool was not black in color. Extremities: No lower extremity edema, clubbing, deformity.  Neuro: Alert and oriented x 4 , grossly normal neurologically.  Skin: Warm and dry, no rash or jaundice.   Psych: Alert and cooperative, normal mood and affect.        Intake/Output from previous day: 03/07 0701 - 03/08 0700 In: 498.3 [I.V.:498.3] Out: 300 [Urine:300] Intake/Output this shift: No intake/output data recorded.  Lab Results: CBC  Recent Labs  05/22/16 1206 05/22/16 1737 05/23/16 0143  WBC 4.5 4.3 4.6  HGB 11.2* 11.1* 9.9*  HCT 32.4* 31.6* 28.8*  MCV 84.8 85.2 86.5  PLT 184 155 164   BMET  Recent Labs  05/22/16 1206 05/23/16 0143  NA 126* 131*  K 4.4 3.9  CL 94* 101  CO2 19* 23  GLUCOSE 70 90  BUN <5* <5*  CREATININE 0.57 0.66  CALCIUM 8.8* 8.5*   LFT  Recent Labs  05/22/16 1206  BILITOT 1.0  ALKPHOS 131*  AST 90*  ALT 30  PROT 7.4  ALBUMIN 3.6    Lipase  Recent Labs  05/22/16 1206  LIPASE <10*    PT/INR No results for input(s): LABPROT, INR in the last 72 hours.    Imaging Studies: Dg Chest 2 View  Result Date: 05/22/2016 CLINICAL DATA:  Abdominal pain, chest pain, vomiting for 4 days pound EXAM: CHEST  2 VIEW COMPARISON:  04/25/2016 FINDINGS: Cardiomediastinal silhouette is stable. No infiltrate or  pleural effusion. No pulmonary edema. Again noted multiple surgical clips in the upper and mid abdomen. IMPRESSION: No active cardiopulmonary disease. Electronically Signed   By: Lahoma Crocker M.D.   On: 05/22/2016 13:53   Ct Abdomen Pelvis W Contrast  Result Date: 05/22/2016 CLINICAL DATA:  Abdominal pain and rectal bleeding. EXAM: CT ABDOMEN AND PELVIS WITH CONTRAST TECHNIQUE: Multidetector CT imaging of the abdomen and pelvis was performed using the standard protocol following bolus administration of intravenous contrast. CONTRAST:  22mL ISOVUE-300 IOPAMIDOL (ISOVUE-300) INJECTION 61%, 145mL ISOVUE-300 IOPAMIDOL (ISOVUE-300) INJECTION 61% COMPARISON:  04/25/2016 FINDINGS: Lower chest: No acute abnormality. Hepatobiliary: Hepatic steatosis. Previous cholecystectomy. Increase caliber of the common bile duct measures 1.1 cm, image 36 of series 4. Previously 9 mm. No obstructing stone or mass noted. Pancreas: Unremarkable. No pancreatic ductal dilatation or surrounding inflammatory changes. Spleen: Normal in size without focal abnormality. Adrenals/Urinary Tract: Previous right adrenalectomy. Nodule in the left adrenal gland measures 2 cm, image number 20 of series 2. This is unchanged from the previous exam. Previously described as representing a benign adenoma. Unremarkable appearance of both kidneys. Urinary bladder appears normal. Stomach/Bowel: The stomach is normal. The small bowel loops have a normal course and caliber. No pathologic dilatation of the colon. Vascular/Lymphatic: Aortic atherosclerosis. No enlarged upper abdominal or pelvic lymph nodes. Reproductive: Status post hysterectomy. No adnexal masses. Other: Chronic scarring and postsurgical changes involving the ventral abdominal wall. Musculoskeletal: No aggressive lytic or sclerotic bone lesions. IMPRESSION: 1. No acute findings. 2. Increase caliber of the common bile duct status post cholecystectomy. 3. Stable left adrenal nodule.  Prior right  adrenalectomy. 4. Aortic atherosclerosis. Electronically Signed   By: Kerby Moors  M.D.   On: 05/22/2016 13:58   Ct Abdomen Pelvis W Contrast  Result Date: 04/25/2016 CLINICAL DATA:  Constipation for 1 week. Nausea and vomiting. History of hysterectomy, cholecystectomy, hiatal hernia, lupus, diverticulitis, Billroth 2. EXAM: CT ABDOMEN AND PELVIS WITH CONTRAST TECHNIQUE: Multidetector CT imaging of the abdomen and pelvis was performed using the standard protocol following bolus administration of intravenous contrast. CONTRAST:  138mL ISOVUE-300 IOPAMIDOL (ISOVUE-300) INJECTION 61% COMPARISON:  Abdominal radiographs April 25, 2016 and CT abdomen and pelvis October 01, 2015 FINDINGS: LOWER CHEST: Lingular scarring and dependent atelectasis. Heart size is normal. No pericardial effusions. Status post fundoplication and gastrojejunostomy. HEPATOBILIARY: The liver is diffusely hypodense compatible with steatosis. Status post cholecystectomy. PANCREAS: Normal. SPLEEN: Normal. ADRENALS/URINARY TRACT: Kidneys are orthotopic, demonstrating symmetric enhancement. No nephrolithiasis, hydronephrosis or solid renal masses. The unopacified ureters are normal in course and caliber. Delayed imaging through the kidneys demonstrates symmetric prompt contrast excretion within the proximal urinary collecting system. Urinary bladder is partially distended and unremarkable. 2.1 cm benign LEFT adrenal adenoma (8 Hounsfield units). Status post apparent RIGHT adrenalectomy. STOMACH/BOWEL: RIGHT lower quadrant small bowel anastomosis with small bowel feces compatible with chronic stasis. Moderate retained large bowel stool without bowel obstruction. Small appendix versus residual stump. VASCULAR/LYMPHATIC: Aortoiliac vessels are normal in course and caliber, mild calcific atherosclerosis. No lymphadenopathy by CT size criteria. REPRODUCTIVE: Status post hysterectomy. OTHER: Trace free fluid in the pelvis, likely physiologic. No focal  fluid collection or intraperitoneal free air. Phleboliths in the pelvis. MUSCULOSKELETAL: Unchanged of tear abdominal wall scarring. IMPRESSION: No acute intra-abdominal or pelvic process. Extensive postsurgical changes of the abdomen. Moderate large bowel stool without bowel obstruction. Electronically Signed   By: Elon Alas M.D.   On: 04/25/2016 14:43   Dg Abd Acute W/chest  Result Date: 04/25/2016 CLINICAL DATA:  Upper abdominal pain EXAM: DG ABDOMEN ACUTE W/ 1V CHEST COMPARISON:  04/19/2016 FINDINGS: Cardiac shadow is stable. Lungs are well aerated bilaterally. Mild scarring is noted in the right mid lung better visualized than on the prior exam due to some projectional differences. The abdomen shows a nonobstructive bowel gas pattern. Fecal material is noted throughout the colon but decreased from the prior study. Postsurgical changes are seen. No acute bony abnormality is noted. No free air is seen. IMPRESSION: Decrease in the degree of fecal material within the colon. No obstructive changes are seen. No acute abnormality is noted in the chest. Electronically Signed   By: Inez Catalina M.D.   On: 04/25/2016 12:15  [4 week]   Impression: 51 year old female with past medical history significant for recurrent pancreatitis, alcohol abuse, prior Billroth II, anemia of chronic disease, gastroparesis, GERD, chronic abdominal pain presenting with 2 day history of reported melena/maroon color stool associated with epigastric pain, lower abdominal cramping. Continues with intermittent vomiting as an outpatient. New finding of increasing diameter of CBD, 1.1 cm now (AST elevated/ALT tbili, alkphos all normal most c/w etoh hepatitis picture).  Patient has had mild drop in her hemoglobin overnight, 1 g/dL. She denies having any BM since admission. Hemodynamically has been stable.  Plan: 1. Consider upper endoscopy given patient's reported presentation (melena), epigastric pain, and prior history of  anastomotic bleeding. To discuss further with Dr. Oneida Alar. May not require colonoscopy unless documented significant overt lower GI bleeding. 2. Continue PPI twice a day. 3. Encouraged alcohol cessation. 4. Consider further evaluation of dilated CBD as outpatient.   We would like to thank you for the opportunity to participate in the care of  Angel Price.  Laureen Ochs. Bernarda Caffey [facility] [phone] 3/8/20189:38 AM     LOS: 0 days

## 2016-05-23 NOTE — Anesthesia Preprocedure Evaluation (Signed)
Anesthesia Evaluation  Patient identified by MRN, date of birth, ID band Patient awake    Reviewed: Allergy & Precautions, H&P , NPO status , Patient's Chart, lab work & pertinent test results  History of Anesthesia Complications Negative for: history of anesthetic complications  Airway Mallampati: I  TM Distance: >3 FB     Dental  (+) Poor Dentition, Chipped, Missing, Dental Advisory Given,    Pulmonary Current Smoker,    breath sounds clear to auscultation       Cardiovascular hypertension, Pt. on medications  Rhythm:Regular Rate:Normal     Neuro/Psych  Headaches, PSYCHIATRIC DISORDERS ( History of schizoaffective disorder) Anxiety Depression Bipolar Disorder    GI/Hepatic hiatal hernia, neg GERD  ,(+)     substance abuse  alcohol use and cocaine use,   Endo/Other  Diabetes: s/p  I131 Rx.Hypothyroidism   Renal/GU      Musculoskeletal   Abdominal   Peds  Hematology  (+) anemia ,   Anesthesia Other Findings Alcohol abuse   Reproductive/Obstetrics                             Anesthesia Physical Anesthesia Plan  ASA: III  Anesthesia Plan: MAC   Post-op Pain Management:    Induction: Intravenous  Airway Management Planned: Simple Face Mask  Additional Equipment:   Intra-op Plan:   Post-operative Plan:   Informed Consent: I have reviewed the patients History and Physical, chart, labs and discussed the procedure including the risks, benefits and alternatives for the proposed anesthesia with the patient or authorized representative who has indicated his/her understanding and acceptance.     Plan Discussed with:   Anesthesia Plan Comments:         Anesthesia Quick Evaluation

## 2016-05-23 NOTE — Progress Notes (Signed)
Initial Nutrition Assessment   INTERVENTION:   Boost Breeze po TID, each supplement provides 250 kcal and 9 grams of protein   ProStat 30 ml TID (each 30 ml provides 100 kcal, 15 gr protein)    NUTRITION DIAGNOSIS:   Inadequate oral intake related to altered GI function as evidenced by per patient/family report.   GOAL:   Patient will meet greater than or equal to 90% of their needs   MONITOR:   Diet advancement, PO intake, Supplement acceptance, Labs, Weight trends, Skin  REASON FOR ASSESSMENT:   Malnutrition Screening Tool    ASSESSMENT: Ms Heft presents with compliant of maroon colored stool. She hac a EGD today and per MD report the source of bleed likely anastomosis. She  has hx of lupus, HTN, Abdominal abscess, Gastroparesis, ETOH abuse.  The patient follows a regular diet at home. No food allergies or chewing /swallowing problems reported. Her weight is within usual body weight range. Although, she says she has not eaten well for several days.  Nutrition-Focused physical exam findings are no fat depletion, mild muscle depletion, and no edema.  Recent Labs Lab 05/22/16 1206 05/23/16 0143  NA 126* 131*  K 4.4 3.9  CL 94* 101  CO2 19* 23  BUN <5* <5*  CREATININE 0.57 0.66  CALCIUM 8.8* 8.5*  GLUCOSE 70 90      Labs: Sodium 131   Meds revieiwed - Folate, MVI, Thiamine   Diet Order:  Diet full liquid Room service appropriate? Yes; Fluid consistency: Thin  Skin:     Last BM:  05/22/16  Height:   Ht Readings from Last 1 Encounters:  05/22/16 5\' 3"  (1.6 m)    Weight:   Wt Readings from Last 1 Encounters:  05/22/16 128 lb (58.1 kg)    Ideal Body Weight:  52.2 kg  BMI:  Body mass index is 22.67 kg/m.  Estimated Nutritional Needs:   Kcal:  5701-7793  Protein:  69-75 gr  Fluid:  1.7-1.9 liters daily  EDUCATION NEEDS:   No education needs identified at this time  Colman Cater MS,RD,CSG,LDN Office: #[phone] Pager: [phone]

## 2016-05-23 NOTE — Transfer of Care (Signed)
Immediate Anesthesia Transfer of Care Note  Patient: Angel Price  Procedure(s) Performed: Procedure(s): ESOPHAGOGASTRODUODENOSCOPY (EGD) WITH PROPOFOL (N/A)  Patient Location: PACU  Anesthesia Type:MAC  Level of Consciousness: awake, alert  and oriented  Airway & Oxygen Therapy: Patient Spontanous Breathing  Post-op Assessment: Report given to RN  Post vital signs: Reviewed and stable  Last Vitals:  Vitals:   05/23/16 1155 05/23/16 1200  BP: 99/70 96/70  Pulse:    Resp: (!) 22 14  Temp:      Last Pain:  Vitals:   05/23/16 1210  TempSrc:   PainSc: 4       Patients Stated Pain Goal: 7 (83/35/82 5189)  Complications: No apparent anesthesia complications

## 2016-05-23 NOTE — Anesthesia Postprocedure Evaluation (Signed)
Anesthesia Post Note  Patient: Angel Price  Procedure(s) Performed: Procedure(s) (LRB): ESOPHAGOGASTRODUODENOSCOPY (EGD) WITH PROPOFOL (N/A)  Patient location during evaluation: PACU Anesthesia Type: MAC Level of consciousness: awake and alert and oriented Pain management: pain level controlled Vital Signs Assessment: post-procedure vital signs reviewed and stable Respiratory status: spontaneous breathing Cardiovascular status: blood pressure returned to baseline Postop Assessment: no signs of nausea or vomiting Anesthetic complications: no     Last Vitals:  Vitals:   05/23/16 1200 05/23/16 1235  BP: 96/70 100/78  Pulse:  96  Resp: 14 (!) 33  Temp:  36.8 C    Last Pain:  Vitals:   05/23/16 1210  TempSrc:   PainSc: 4                  Letzy Gullickson

## 2016-05-23 NOTE — Op Note (Signed)
[facility] Patient Name: Angel Price Procedure Date: 05/23/2016 12:00 PM MRN: [id number] Date of Birth: Mar 04, 1966 Attending MD: [name] , MD CSN: [id number] Age: 51 Admit Type: Inpatient Procedure:                Upper GI endoscopy WITH CONTROL BLEEDING Indications:              Hematochezia, Melena Providers:                [name], MD, [place] B. Sharon Seller, RN, [place].                            [place], Merchant [employer] officer Referring MD:              Medicines:                Propofol per Anesthesia Complications:            No immediate complications. Estimated Blood Loss:     Estimated blood loss was minima                           l. Procedure:                Pre-Anesthesia Assessment:                           - Prior to the procedure, a History and Physical                            was performed, and patient medications and                            allergies were reviewed. The patient's tolerance of                            previous anesthesia was also reviewed. The risks                            and benefits of the procedure and the sedation                            options and risks were discussed with the patient.                            All questions were answered, and informed consent                            was obtained. Prior Anticoagulants: The patient has                            taken no previous anticoagulant or antiplatelet                            agents. ASA Grade Assessment: III - A patient with  severe systemic disease. After reviewing the risks                            and benefits, the patient was deemed in                            satisfactory condition to undergo the procedure.                           After obtaining informed consent, the endoscope was                            passed under direct vision. Throughout the                            procedure, the patient's blood pressure, pulse, and                             oxygen saturations were monitored continuously. The                            [id number] ([id number]) scope was introduced through the                            mouth, and advanced to the second part of duodenum.                            The upper GI endoscopy was accomplished without                            difficulty. The patient tolerated the procedure                            well. Scope In: 12:14:58 PM Scope Out: 12:28:28 PM Total Procedure Duration: 0 hours 13 minutes 30 seconds  Findings:      The examined esophagus was normal.      Red blood was found at the anastomosis. Area was unsuccessfully injected       with 3 mL of a 1:10,000 solution of epinephrine for hemostasis. For       hemostasis, three hemostatic clips were successfully placed (MR       conditional). There was no bleeding at the end of the procedure.       Estimated blood loss was minimal. Impression:               -                           - GI BLEED MOST LIKELY DUE TO DIEULAFOY'S LESION AT                            anastomosis. Moderate Sedation:      Per Anesthesia Care Recommendation:           - Return patient to hospital ward for ongoing care.  IF PATIENT CONTINUES TO BLEED WILL NEED COLONOSCOPY.                           - Full liquid diet.                           - Continue present medications. Procedure Code(s):        --- Professional ---                           [phone], Esophagogastroduodenoscopy, flexible,                            transoral; with control of bleeding, any method Diagnosis Code(s):        --- Professional ---                           K92.2, Gastrointestinal hemorrhage, unspecified                           K92.1, Melena (includes Hematochezia) CPT copyright 2016 American Medical Association. All rights reserved. The codes documented in this report are preliminary and upon coder review may  be revised to meet current  compliance requirements. [name], MD [name], MD 05/23/2016 12:43:22 PM This report has been signed electronically. Number of Addenda: 0

## 2016-05-24 DIAGNOSIS — D638 Anemia in other chronic diseases classified elsewhere: Secondary | ICD-10-CM | POA: Diagnosis present

## 2016-05-24 DIAGNOSIS — K3182 Dieulafoy lesion (hemorrhagic) of stomach and duodenum: Secondary | ICD-10-CM

## 2016-05-24 DIAGNOSIS — S31100D Unspecified open wound of abdominal wall, right upper quadrant without penetration into peritoneal cavity, subsequent encounter: Secondary | ICD-10-CM | POA: Diagnosis not present

## 2016-05-24 DIAGNOSIS — K922 Gastrointestinal hemorrhage, unspecified: Secondary | ICD-10-CM | POA: Diagnosis not present

## 2016-05-24 DIAGNOSIS — Z87898 Personal history of other specified conditions: Secondary | ICD-10-CM

## 2016-05-24 DIAGNOSIS — F1410 Cocaine abuse, uncomplicated: Secondary | ICD-10-CM | POA: Diagnosis present

## 2016-05-24 DIAGNOSIS — I10 Essential (primary) hypertension: Secondary | ICD-10-CM | POA: Diagnosis not present

## 2016-05-24 DIAGNOSIS — D649 Anemia, unspecified: Secondary | ICD-10-CM | POA: Diagnosis not present

## 2016-05-24 DIAGNOSIS — F319 Bipolar disorder, unspecified: Secondary | ICD-10-CM | POA: Diagnosis not present

## 2016-05-24 DIAGNOSIS — L02211 Cutaneous abscess of abdominal wall: Secondary | ICD-10-CM | POA: Diagnosis not present

## 2016-05-24 DIAGNOSIS — F419 Anxiety disorder, unspecified: Secondary | ICD-10-CM | POA: Diagnosis present

## 2016-05-24 DIAGNOSIS — K921 Melena: Secondary | ICD-10-CM | POA: Diagnosis not present

## 2016-05-24 DIAGNOSIS — K3184 Gastroparesis: Secondary | ICD-10-CM | POA: Diagnosis not present

## 2016-05-24 DIAGNOSIS — E871 Hypo-osmolality and hyponatremia: Secondary | ICD-10-CM | POA: Diagnosis not present

## 2016-05-24 DIAGNOSIS — Z980 Intestinal bypass and anastomosis status: Secondary | ICD-10-CM

## 2016-05-24 DIAGNOSIS — R69 Illness, unspecified: Secondary | ICD-10-CM | POA: Diagnosis not present

## 2016-05-24 DIAGNOSIS — F609 Personality disorder, unspecified: Secondary | ICD-10-CM | POA: Diagnosis present

## 2016-05-24 DIAGNOSIS — F1010 Alcohol abuse, uncomplicated: Secondary | ICD-10-CM | POA: Diagnosis not present

## 2016-05-24 DIAGNOSIS — Z79899 Other long term (current) drug therapy: Secondary | ICD-10-CM | POA: Diagnosis not present

## 2016-05-24 DIAGNOSIS — F259 Schizoaffective disorder, unspecified: Secondary | ICD-10-CM | POA: Diagnosis not present

## 2016-05-24 DIAGNOSIS — K625 Hemorrhage of anus and rectum: Secondary | ICD-10-CM | POA: Diagnosis not present

## 2016-05-24 DIAGNOSIS — F17210 Nicotine dependence, cigarettes, uncomplicated: Secondary | ICD-10-CM | POA: Diagnosis present

## 2016-05-24 DIAGNOSIS — K219 Gastro-esophageal reflux disease without esophagitis: Secondary | ICD-10-CM | POA: Diagnosis not present

## 2016-05-24 DIAGNOSIS — D573 Sickle-cell trait: Secondary | ICD-10-CM | POA: Diagnosis present

## 2016-05-24 LAB — CBC
HCT: 27.4 % — ABNORMAL LOW (ref 36.0–46.0)
Hemoglobin: 9.3 g/dL — ABNORMAL LOW (ref 12.0–15.0)
MCH: 29.5 pg (ref 26.0–34.0)
MCHC: 33.9 g/dL (ref 30.0–36.0)
MCV: 87 fL (ref 78.0–100.0)
PLATELETS: 153 10*3/uL (ref 150–400)
RBC: 3.15 MIL/uL — AB (ref 3.87–5.11)
RDW: 14.6 % (ref 11.5–15.5)
WBC: 4.1 10*3/uL (ref 4.0–10.5)

## 2016-05-24 LAB — BASIC METABOLIC PANEL
Anion gap: 6 (ref 5–15)
CALCIUM: 8.3 mg/dL — AB (ref 8.9–10.3)
CO2: 21 mmol/L — AB (ref 22–32)
CREATININE: 0.46 mg/dL (ref 0.44–1.00)
Chloride: 108 mmol/L (ref 101–111)
GFR calc non Af Amer: >60 mL/min (ref >60)
Glucose, Bld: 135 mg/dL — ABNORMAL HIGH (ref 65–99)
Potassium: 3 mmol/L — ABNORMAL LOW (ref 3.5–5.1)
SODIUM: 135 mmol/L (ref 135–145)

## 2016-05-24 MED ORDER — PANTOPRAZOLE SODIUM 40 MG PO TBEC
40.0000 mg | DELAYED_RELEASE_TABLET | Freq: Two times a day (BID) | ORAL | Status: DC
  Administered 2016-05-24 – 2016-05-26 (×4): 40 mg via ORAL
  Filled 2016-05-24 (×4): qty 1

## 2016-05-24 MED ORDER — ONDANSETRON HCL 4 MG/2ML IJ SOLN
4.0000 mg | Freq: Four times a day (QID) | INTRAMUSCULAR | Status: DC
  Administered 2016-05-24 – 2016-05-25 (×4): 4 mg via INTRAVENOUS
  Filled 2016-05-24 (×4): qty 2

## 2016-05-24 NOTE — Progress Notes (Signed)
Subjective: Patient is resting. She had EGD yesterday. Patient continue to complain of melena. No nausea or vomiting. .  Objective: Vital signs in last 24 hours: Temp:  [97.6 F (36.4 C)-98.8 F (37.1 C)] 98.6 F (37 C) (03/09 0554) Pulse Rate:  [70-98] 87 (03/09 0554) Resp:  [13-33] 15 (03/09 0554) BP: (89-107)/(65-79) 107/71 (03/09 0554) SpO2:  [85 %-100 %] 98 % (03/09 0554) Weight change:  Last BM Date: 05/22/16  Intake/Output from previous day: 03/08 0701 - 03/09 0700 In: 3420 [P.O.:720; I.V.:2700] Out: 600 [Urine:600]  PHYSICAL EXAM General appearance: alert and no distress Resp: clear to auscultation bilaterally Cardio: S1, S2 normal GI: dressed abdominal wall wound Extremities: extremities normal, atraumatic, no cyanosis or edema  Lab Results:  Results for orders placed or performed during the hospital encounter of 05/22/16 (from the past 48 hour(s))  Rapid urine drug screen (hospital performed)     Status: None   Collection Time: 05/22/16 11:38 AM  Result Value Ref Range   Opiates NONE DETECTED NONE DETECTED   Cocaine NONE DETECTED NONE DETECTED   Benzodiazepines NONE DETECTED NONE DETECTED   Amphetamines NONE DETECTED NONE DETECTED   Tetrahydrocannabinol NONE DETECTED NONE DETECTED   Barbiturates NONE DETECTED NONE DETECTED    Comment:        DRUG SCREEN FOR MEDICAL PURPOSES ONLY.  IF CONFIRMATION IS NEEDED FOR ANY PURPOSE, NOTIFY LAB WITHIN 5 DAYS.        LOWEST DETECTABLE LIMITS FOR URINE DRUG SCREEN Drug Class       Cutoff (ng/mL) Amphetamine      1000 Barbiturate      200 Benzodiazepine   413 Tricyclics       244 Opiates          300 Cocaine          300 THC              50   Comprehensive metabolic panel     Status: Abnormal   Collection Time: 05/22/16 12:06 PM  Result Value Ref Range   Sodium 126 (L) 135 - 145 mmol/L   Potassium 4.4 3.5 - 5.1 mmol/L   Chloride 94 (L) 101 - 111 mmol/L   CO2 19 (L) 22 - 32 mmol/L   Glucose, Bld 70 65 - 99  mg/dL   BUN <5 (L) 6 - 20 mg/dL   Creatinine, Ser 0.57 0.44 - 1.00 mg/dL   Calcium 8.8 (L) 8.9 - 10.3 mg/dL   Total Protein 7.4 6.5 - 8.1 g/dL   Albumin 3.6 3.5 - 5.0 g/dL   AST 90 (H) 15 - 41 U/L   ALT 30 14 - 54 U/L   Alkaline Phosphatase 131 (H) 38 - 126 U/L   Total Bilirubin 1.0 0.3 - 1.2 mg/dL   GFR calc non Af Amer >60 >60 mL/min   GFR calc Af Amer >60 >60 mL/min    Comment: (NOTE) The eGFR has been calculated using the CKD EPI equation. This calculation has not been validated in all clinical situations. eGFR's persistently <60 mL/min signify possible Chronic Kidney Disease.    Anion gap 13 5 - 15  Lipase, blood     Status: Abnormal   Collection Time: 05/22/16 12:06 PM  Result Value Ref Range   Lipase <10 (L) 11 - 51 U/L  Ethanol     Status: Abnormal   Collection Time: 05/22/16 12:06 PM  Result Value Ref Range   Alcohol, Ethyl (B) 17 (H) <5 mg/dL  Comment:        LOWEST DETECTABLE LIMIT FOR SERUM ALCOHOL IS 5 mg/dL FOR MEDICAL PURPOSES ONLY   CBC with Differential/Platelet     Status: Abnormal   Collection Time: 05/22/16 12:06 PM  Result Value Ref Range   WBC 4.5 4.0 - 10.5 K/uL   RBC 3.82 (L) 3.87 - 5.11 MIL/uL   Hemoglobin 11.2 (L) 12.0 - 15.0 g/dL   HCT 32.4 (L) 36.0 - 46.0 %   MCV 84.8 78.0 - 100.0 fL   MCH 29.3 26.0 - 34.0 pg   MCHC 34.6 30.0 - 36.0 g/dL   RDW 14.3 11.5 - 15.5 %   Platelets 184 150 - 400 K/uL   Neutrophils Relative % 53 %   Neutro Abs 2.4 1.7 - 7.7 K/uL   Lymphocytes Relative 37 %   Lymphs Abs 1.7 0.7 - 4.0 K/uL   Monocytes Relative 10 %   Monocytes Absolute 0.4 0.1 - 1.0 K/uL   Eosinophils Relative 0 %   Eosinophils Absolute 0.0 0.0 - 0.7 K/uL   Basophils Relative 0 %   Basophils Absolute 0.0 0.0 - 0.1 K/uL  Troponin I     Status: None   Collection Time: 05/22/16 12:06 PM  Result Value Ref Range   Troponin I <0.03 <0.03 ng/mL  POC occult blood, ED Provider will collect     Status: Abnormal   Collection Time: 05/22/16  2:31 PM   Result Value Ref Range   Fecal Occult Bld POSITIVE (A) NEGATIVE  MRSA PCR Screening     Status: None   Collection Time: 05/22/16  4:15 PM  Result Value Ref Range   MRSA by PCR NEGATIVE NEGATIVE    Comment:        The GeneXpert MRSA Assay (FDA approved for NASAL specimens only), is one component of a comprehensive MRSA colonization surveillance program. It is not intended to diagnose MRSA infection nor to guide or monitor treatment for MRSA infections.   HIV antibody (Routine Testing)     Status: None   Collection Time: 05/22/16  5:37 PM  Result Value Ref Range   HIV Screen 4th Generation wRfx Non Reactive Non Reactive    Comment: (NOTE) Performed At: [facility] [place], [place] [id number] Lindon Romp MD [id number]   Type and screen [facility]     Status: None (Preliminary result)   Collection Time: 05/22/16  5:37 PM  Result Value Ref Range   ABO/RH(D) O POS    Antibody Screen NEG    Sample Expiration 05/25/2016    Unit Number [id number]    Blood Component Type RBC LR PHER1    Unit division 00    Status of Unit ALLOCATED    Transfusion Status OK TO TRANSFUSE    Crossmatch Result Compatible    Unit Number [id number]    Blood Component Type RBC LR PHER2    Unit division 00    Status of Unit ALLOCATED    Transfusion Status OK TO TRANSFUSE    Crossmatch Result Compatible   CBC     Status: Abnormal   Collection Time: 05/22/16  5:37 PM  Result Value Ref Range   WBC 4.3 4.0 - 10.5 K/uL   RBC 3.71 (L) 3.87 - 5.11 MIL/uL   Hemoglobin 11.1 (L) 12.0 - 15.0 g/dL   HCT 31.6 (L) 36.0 - 46.0 %   MCV 85.2 78.0 - 100.0 fL   MCH 29.9 26.0 - 34.0 pg   MCHC  35.1 30.0 - 36.0 g/dL   RDW 14.4 11.5 - 15.5 %   Platelets 155 150 - 400 K/uL  Prepare RBC     Status: None   Collection Time: 05/22/16  5:38 PM  Result Value Ref Range   Order Confirmation ORDER PROCESSED BY BLOOD BANK   Basic metabolic panel     Status: Abnormal    Collection Time: 05/23/16  1:43 AM  Result Value Ref Range   Sodium 131 (L) 135 - 145 mmol/L   Potassium 3.9 3.5 - 5.1 mmol/L   Chloride 101 101 - 111 mmol/L   CO2 23 22 - 32 mmol/L   Glucose, Bld 90 65 - 99 mg/dL   BUN <5 (L) 6 - 20 mg/dL   Creatinine, Ser 0.66 0.44 - 1.00 mg/dL   Calcium 8.5 (L) 8.9 - 10.3 mg/dL   GFR calc non Af Amer >60 >60 mL/min   GFR calc Af Amer >60 >60 mL/min    Comment: (NOTE) The eGFR has been calculated using the CKD EPI equation. This calculation has not been validated in all clinical situations. eGFR's persistently <60 mL/min signify possible Chronic Kidney Disease.    Anion gap 7 5 - 15  CBC     Status: Abnormal   Collection Time: 05/23/16  1:43 AM  Result Value Ref Range   WBC 4.6 4.0 - 10.5 K/uL   RBC 3.33 (L) 3.87 - 5.11 MIL/uL   Hemoglobin 9.9 (L) 12.0 - 15.0 g/dL   HCT 28.8 (L) 36.0 - 46.0 %   MCV 86.5 78.0 - 100.0 fL   MCH 29.7 26.0 - 34.0 pg   MCHC 34.4 30.0 - 36.0 g/dL   RDW 14.3 11.5 - 15.5 %   Platelets 164 150 - 400 K/uL  CBC     Status: Abnormal   Collection Time: 05/23/16  9:40 AM  Result Value Ref Range   WBC 3.9 (L) 4.0 - 10.5 K/uL   RBC 3.27 (L) 3.87 - 5.11 MIL/uL   Hemoglobin 9.7 (L) 12.0 - 15.0 g/dL   HCT 28.2 (L) 36.0 - 46.0 %   MCV 86.2 78.0 - 100.0 fL   MCH 29.7 26.0 - 34.0 pg   MCHC 34.4 30.0 - 36.0 g/dL   RDW 14.4 11.5 - 15.5 %   Platelets 159 150 - 400 K/uL  Basic metabolic panel     Status: Abnormal   Collection Time: 05/24/16  4:24 AM  Result Value Ref Range   Sodium 135 135 - 145 mmol/L   Potassium 3.0 (L) 3.5 - 5.1 mmol/L    Comment: DELTA CHECK NOTED   Chloride 108 101 - 111 mmol/L   CO2 21 (L) 22 - 32 mmol/L   Glucose, Bld 135 (H) 65 - 99 mg/dL   BUN <5 (L) 6 - 20 mg/dL   Creatinine, Ser 0.46 0.44 - 1.00 mg/dL   Calcium 8.3 (L) 8.9 - 10.3 mg/dL   GFR calc non Af Amer >60 >60 mL/min   GFR calc Af Amer >60 >60 mL/min    Comment: (NOTE) The eGFR has been calculated using the CKD EPI equation. This  calculation has not been validated in all clinical situations. eGFR's persistently <60 mL/min signify possible Chronic Kidney Disease.    Anion gap 6 5 - 15  CBC     Status: Abnormal   Collection Time: 05/24/16  4:24 AM  Result Value Ref Range   WBC 4.1 4.0 - 10.5 K/uL   RBC 3.15 (L) 3.87 -  5.11 MIL/uL   Hemoglobin 9.3 (L) 12.0 - 15.0 g/dL   HCT 27.4 (L) 36.0 - 46.0 %   MCV 87.0 78.0 - 100.0 fL   MCH 29.5 26.0 - 34.0 pg   MCHC 33.9 30.0 - 36.0 g/dL   RDW 14.6 11.5 - 15.5 %   Platelets 153 150 - 400 K/uL    ABGS No results for input(s): PHART, PO2ART, TCO2, HCO3 in the last 72 hours.  Invalid input(s): PCO2 CULTURES Recent Results (from the past 240 hour(s))  MRSA PCR Screening     Status: None   Collection Time: 05/22/16  4:15 PM  Result Value Ref Range Status   MRSA by PCR NEGATIVE NEGATIVE Final    Comment:        The GeneXpert MRSA Assay (FDA approved for NASAL specimens only), is one component of a comprehensive MRSA colonization surveillance program. It is not intended to diagnose MRSA infection nor to guide or monitor treatment for MRSA infections.    Studies/Results: Dg Chest 2 View  Result Date: 05/22/2016 CLINICAL DATA:  Abdominal pain, chest pain, vomiting for 4 days pound EXAM: CHEST  2 VIEW COMPARISON:  04/25/2016 FINDINGS: Cardiomediastinal silhouette is stable. No infiltrate or pleural effusion. No pulmonary edema. Again noted multiple surgical clips in the upper and mid abdomen. IMPRESSION: No active cardiopulmonary disease. Electronically Signed   By: Lahoma Crocker M.D.   On: 05/22/2016 13:53   Ct Abdomen Pelvis W Contrast  Result Date: 05/22/2016 CLINICAL DATA:  Abdominal pain and rectal bleeding. EXAM: CT ABDOMEN AND PELVIS WITH CONTRAST TECHNIQUE: Multidetector CT imaging of the abdomen and pelvis was performed using the standard protocol following bolus administration of intravenous contrast. CONTRAST:  73m ISOVUE-300 IOPAMIDOL (ISOVUE-300) INJECTION  61%, 1053mISOVUE-300 IOPAMIDOL (ISOVUE-300) INJECTION 61% COMPARISON:  04/25/2016 FINDINGS: Lower chest: No acute abnormality. Hepatobiliary: Hepatic steatosis. Previous cholecystectomy. Increase caliber of the common bile duct measures 1.1 cm, image 36 of series 4. Previously 9 mm. No obstructing stone or mass noted. Pancreas: Unremarkable. No pancreatic ductal dilatation or surrounding inflammatory changes. Spleen: Normal in size without focal abnormality. Adrenals/Urinary Tract: Previous right adrenalectomy. Nodule in the left adrenal gland measures 2 cm, image number 20 of series 2. This is unchanged from the previous exam. Previously described as representing a benign adenoma. Unremarkable appearance of both kidneys. Urinary bladder appears normal. Stomach/Bowel: The stomach is normal. The small bowel loops have a normal course and caliber. No pathologic dilatation of the colon. Vascular/Lymphatic: Aortic atherosclerosis. No enlarged upper abdominal or pelvic lymph nodes. Reproductive: Status post hysterectomy. No adnexal masses. Other: Chronic scarring and postsurgical changes involving the ventral abdominal wall. Musculoskeletal: No aggressive lytic or sclerotic bone lesions. IMPRESSION: 1. No acute findings. 2. Increase caliber of the common bile duct status post cholecystectomy. 3. Stable left adrenal nodule.  Prior right adrenalectomy. 4. Aortic atherosclerosis. Electronically Signed   By: TaKerby Moors.D.   On: 05/22/2016 13:58    Medications: I have reviewed the patient's current medications.  Assesment:  Principal Problem:   Gastrointestinal hemorrhage Active Problems:   History of cocaine abuse   History of schizoaffective disorder   Bipolar disorder (HCC)   Personality disorder   Tobacco abuse   Alcohol abuse   Abdominal wall abscess    Plan:  Medications reviewed Will continue PPI Will monitor CBC GI consult appreciated.    LOS: 0 days   Ryelan Kazee 05/24/2016, 8:13  AM

## 2016-05-24 NOTE — Progress Notes (Signed)
Subjective: Overall doing better. Did have a bowel movement last night which she stats had red blood in it. Continued upper abdominal pain but improved from the last few days. Some continued nausea, no vomiting. Somewhat "woozy-headed". No other GI complaints.  Objective: Vital signs in last 24 hours: Temp:  [98.5 F (36.9 C)-98.7 F (37.1 C)] 98.5 F (36.9 C) (03/09 1258) Pulse Rate:  [80-87] 80 (03/09 1258) Resp:  [15-19] 18 (03/09 1258) BP: (107-126)/(71-89) 126/89 (03/09 1258) SpO2:  [94 %-100 %] 100 % (03/09 1258) Last BM Date: 05/22/16 General:   Alert and oriented, pleasant Head:  Normocephalic and atraumatic. Eyes:  No icterus, sclera clear. Conjuctiva pink.  Heart:  S1, S2 present, no murmurs noted.  Lungs: Clear to auscultation bilaterally, without wheezing, rales, or rhonchi.  Abdomen:  Bowel sounds present, soft, non-distended. Minimal epigastric TTP. No HSM or hernias noted. No rebound or guarding. Msk:  Symmetrical without gross deformities. Pulses:  Normal bilateral DP pulses noted. Extremities:  Without clubbing or edema. Neurologic:  Alert and  oriented x4;  grossly normal neurologically. Psych:  Alert and cooperative. Normal mood and affect.  Intake/Output from previous day: 03/08 0701 - 03/09 0700 In: 1884 [P.O.:720; I.V.:2700] Out: 600 [Urine:600] Intake/Output this shift: Total I/O In: 460 [P.O.:460] Out: -   Lab Results:  Recent Labs  05/23/16 0143 05/23/16 0940 05/24/16 0424  WBC 4.6 3.9* 4.1  HGB 9.9* 9.7* 9.3*  HCT 28.8* 28.2* 27.4*  PLT 164 159 153   BMET  Recent Labs  05/22/16 1206 05/23/16 0143 05/24/16 0424  NA 126* 131* 135  K 4.4 3.9 3.0*  CL 94* 101 108  CO2 19* 23 21*  GLUCOSE 70 90 135*  BUN <5* <5* <5*  CREATININE 0.57 0.66 0.46  CALCIUM 8.8* 8.5* 8.3*   LFT  Recent Labs  05/22/16 1206  PROT 7.4  ALBUMIN 3.6  AST 90*  ALT 30  ALKPHOS 131*  BILITOT 1.0   PT/INR No results for input(s): LABPROT, INR in  the last 72 hours. Hepatitis Panel No results for input(s): HEPBSAG, HCVAB, HEPAIGM, HEPBIGM in the last 72 hours.   Studies/Results: No results found.  Assessment: 51 year old female with past medical history significant for recurrent pancreatitis, alcohol abuse, prior Billroth II, anemia of chronic disease, gastroparesis, GERD, chronic abdominal pain presenting with 2 day history of reported melena/maroon color stool associated with epigastric pain, lower abdominal cramping. Continued with intermittent vomiting as an outpatient. New finding of increasing diameter of CBD, 1.1 cm now (AST elevated/ALT tbili, alkphos all normal most c/w etoh hepatitis picture). Patient has had mild drop in her hemoglobin the night of admission, 1 g/dL.  Underwent upper endoscopy yesterday 05/23/2016 which found normal esophagus, red blood at the anastomosis unsuccessfully injected with 3 mL of 1-10,000 solution of epinephrine, successful placement of 3 hemostatic clips. No bleeding at the end of the procedure. 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 Recommended if continued bleed may need colonoscopy for discharge. Full liquid diet, continue present medications.  Today she states she had a single bowel movement last night with some red blood in it. Abdominal pain improved. Hemoglobin remains essentially stable at 9.3 compared to 9.7 and 9.9 yesterday. Has been getting hydration as well. At this point doubt significant residual lower GI bleed. We can continue to take a monitoring standpoint and consider if colonoscopy needed in the next day or 2. Overall improved since admission.  Plan: 1. Continue to monitor  for further GI bleed 2. Monitor hemoglobin 3. Transfuse as necessary 4. We'll add Zofran for complaints of nausea. 5. Supportive measures   Thank you for allowing [place] to participate in the care of Angel Marek, DNP, AGNP-C Adult & Gerontological Nurse  Practitioner [facility]     LOS: 0 days    05/24/2016, 2:58 PM

## 2016-05-25 LAB — BASIC METABOLIC PANEL
Anion gap: 4 — ABNORMAL LOW (ref 5–15)
BUN: <5 mg/dL — ABNORMAL LOW (ref 6–20)
CO2: 22 mmol/L (ref 22–32)
CREATININE: 0.43 mg/dL — AB (ref 0.44–1.00)
Calcium: 8.2 mg/dL — ABNORMAL LOW (ref 8.9–10.3)
Chloride: 111 mmol/L (ref 101–111)
Glucose, Bld: 98 mg/dL (ref 65–99)
POTASSIUM: 3 mmol/L — AB (ref 3.5–5.1)
SODIUM: 137 mmol/L (ref 135–145)

## 2016-05-25 LAB — CBC
HCT: 26.9 % — ABNORMAL LOW (ref 36.0–46.0)
Hemoglobin: 9.1 g/dL — ABNORMAL LOW (ref 12.0–15.0)
MCH: 29.6 pg (ref 26.0–34.0)
MCHC: 33.8 g/dL (ref 30.0–36.0)
MCV: 87.6 fL (ref 78.0–100.0)
PLATELETS: 133 10*3/uL — AB (ref 150–400)
RBC: 3.07 MIL/uL — AB (ref 3.87–5.11)
RDW: 14.7 % (ref 11.5–15.5)
WBC: 5.2 10*3/uL (ref 4.0–10.5)

## 2016-05-25 MED ORDER — ONDANSETRON HCL 4 MG PO TABS
4.0000 mg | ORAL_TABLET | Freq: Four times a day (QID) | ORAL | Status: DC
  Administered 2016-05-25 – 2016-05-26 (×3): 4 mg via ORAL
  Filled 2016-05-25 (×2): qty 1

## 2016-05-25 MED ORDER — ALBUTEROL SULFATE (2.5 MG/3ML) 0.083% IN NEBU
2.5000 mg | INHALATION_SOLUTION | Freq: Four times a day (QID) | RESPIRATORY_TRACT | Status: DC
  Administered 2016-05-25 (×2): 2.5 mg via RESPIRATORY_TRACT
  Filled 2016-05-25 (×2): qty 3

## 2016-05-25 MED ORDER — ALBUTEROL SULFATE HFA 108 (90 BASE) MCG/ACT IN AERS: 2.0000 | INHALATION_SPRAY | Freq: Four times a day (QID) | RESPIRATORY_TRACT | Status: DC

## 2016-05-25 MED ORDER — POTASSIUM CHLORIDE CRYS ER 20 MEQ PO TBCR
40.0000 meq | EXTENDED_RELEASE_TABLET | Freq: Two times a day (BID) | ORAL | Status: DC
  Administered 2016-05-25 (×2): 40 meq via ORAL
  Filled 2016-05-25 (×2): qty 4

## 2016-05-25 MED ORDER — ALBUTEROL SULFATE (2.5 MG/3ML) 0.083% IN NEBU
2.5000 mg | INHALATION_SOLUTION | Freq: Four times a day (QID) | RESPIRATORY_TRACT | Status: DC
  Administered 2016-05-26: 2.5 mg via RESPIRATORY_TRACT
  Filled 2016-05-25 (×2): qty 3

## 2016-05-25 NOTE — Progress Notes (Signed)
Patient ID: Angel Price, female   DOB: 1966/03/01, 51 y.o.   MRN: [id number]   Assessment/Plan: ADMITTED WITH GI BLEED. NO BRBPR OR MELENA. CLINICALLY IMPROVED. Hb STABLE.  PLAN: 1. REPLACE K  2. ADD ALBUTEROL MDI 2 PUFFS Q6H FOR 7 DAYS. MED SIDE EFFECTS DISCUSSED. 3. ANTICIPATE D/C IN NEXT 24 HRS. NEED OPV W/ RGA IN NEXT 4-6 WEEKS. 4. CONSIDER SCREENING TCS AS AN OUTPATIENT.  Subjective: Since I last evaluated the patient SHE HAS NAUSEA. NO DIFFERENT THAN USUAL. PASSING GAS. NO BRBPR OR MELENA. HAS A COUGH WHICH IS OCCASIONALLY PRODUCTIVE. NO VOMITING. TOLERATING POs.   Objective: Vital signs in last 24 hours: Vitals:   05/24/16 2128 05/25/16 0402  BP: (!) 131/98 (!) 140/94  Pulse: 76 82  Resp: 16 18  Temp: 98.6 F (37 C) 98.5 F (36.9 C)   General appearance: alert, cooperative and no distress Resp: clear to auscultation bilaterally Cardio: regular rate and rhythm GI: soft, MILDLY tender IN THE EPIGASTRIUM; bowel sounds normal   Lab Results:  K 3.0 Cr 0.43 Hb 9.1(FEB 3-MAR 10 12.5-9.1) PLT 133   Studies/Results: No results found.  Medications: I have reviewed the patient's current medications.   LOS: 5 days   [name] 08/26/2013, 2:23 PM

## 2016-05-25 NOTE — Progress Notes (Signed)
Patient tolerating diet and has scheduled IV Zofran.  Dr. Oneida Alar notified and changed route to po Zofran.

## 2016-05-25 NOTE — Progress Notes (Signed)
Subjective: Patient feels better today. Her rectal bleeding is subsiding. No nausea or vomiting.  Objective: Vital signs in last 24 hours: Temp:  [98.5 F (36.9 C)-98.6 F (37 C)] 98.5 F (36.9 C) (03/10 0402) Pulse Rate:  [76-82] 82 (03/10 0402) Resp:  [16-18] 18 (03/10 0402) BP: (126-140)/(89-98) 140/94 (03/10 0402) SpO2:  [98 %-100 %] 98 % (03/10 0402) Weight change:  Last BM Date: 05/22/16  Intake/Output from previous day: 03/09 0701 - 03/10 0700 In: 3500 [P.O.:700; I.V.:2800] Out: -   PHYSICAL EXAM General appearance: alert and no distress Resp: clear to auscultation bilaterally Cardio: S1, S2 normal GI: dressed abdominal wall wound Extremities: extremities normal, atraumatic, no cyanosis or edema  Lab Results:  Results for orders placed or performed during the hospital encounter of 05/22/16 (from the past 48 hour(s))  CBC     Status: Abnormal   Collection Time: 05/23/16  9:40 AM  Result Value Ref Range   WBC 3.9 (L) 4.0 - 10.5 K/uL   RBC 3.27 (L) 3.87 - 5.11 MIL/uL   Hemoglobin 9.7 (L) 12.0 - 15.0 g/dL   HCT 28.2 (L) 36.0 - 46.0 %   MCV 86.2 78.0 - 100.0 fL   MCH 29.7 26.0 - 34.0 pg   MCHC 34.4 30.0 - 36.0 g/dL   RDW 14.4 11.5 - 15.5 %   Platelets 159 150 - 400 K/uL  Basic metabolic panel     Status: Abnormal   Collection Time: 05/24/16  4:24 AM  Result Value Ref Range   Sodium 135 135 - 145 mmol/L   Potassium 3.0 (L) 3.5 - 5.1 mmol/L    Comment: DELTA CHECK NOTED   Chloride 108 101 - 111 mmol/L   CO2 21 (L) 22 - 32 mmol/L   Glucose, Bld 135 (H) 65 - 99 mg/dL   BUN <5 (L) 6 - 20 mg/dL   Creatinine, Ser 0.46 0.44 - 1.00 mg/dL   Calcium 8.3 (L) 8.9 - 10.3 mg/dL   GFR calc non Af Amer >60 >60 mL/min   GFR calc Af Amer >60 >60 mL/min    Comment: (NOTE) The eGFR has been calculated using the CKD EPI equation. This calculation has not been validated in all clinical situations. eGFR's persistently <60 mL/min signify possible Chronic Kidney Disease.    Anion gap 6 5 - 15  CBC     Status: Abnormal   Collection Time: 05/24/16  4:24 AM  Result Value Ref Range   WBC 4.1 4.0 - 10.5 K/uL   RBC 3.15 (L) 3.87 - 5.11 MIL/uL   Hemoglobin 9.3 (L) 12.0 - 15.0 g/dL   HCT 27.4 (L) 36.0 - 46.0 %   MCV 87.0 78.0 - 100.0 fL   MCH 29.5 26.0 - 34.0 pg   MCHC 33.9 30.0 - 36.0 g/dL   RDW 14.6 11.5 - 15.5 %   Platelets 153 150 - 400 K/uL  Basic metabolic panel     Status: Abnormal   Collection Time: 05/25/16  6:08 AM  Result Value Ref Range   Sodium 137 135 - 145 mmol/L   Potassium 3.0 (L) 3.5 - 5.1 mmol/L   Chloride 111 101 - 111 mmol/L   CO2 22 22 - 32 mmol/L   Glucose, Bld 98 65 - 99 mg/dL   BUN <5 (L) 6 - 20 mg/dL   Creatinine, Ser 0.43 (L) 0.44 - 1.00 mg/dL   Calcium 8.2 (L) 8.9 - 10.3 mg/dL   GFR calc non Af Amer >60 >60 mL/min  GFR calc Af Amer >60 >60 mL/min    Comment: (NOTE) The eGFR has been calculated using the CKD EPI equation. This calculation has not been validated in all clinical situations. eGFR's persistently <60 mL/min signify possible Chronic Kidney Disease.    Anion gap 4 (L) 5 - 15  CBC     Status: Abnormal   Collection Time: 05/25/16  6:08 AM  Result Value Ref Range   WBC 5.2 4.0 - 10.5 K/uL   RBC 3.07 (L) 3.87 - 5.11 MIL/uL   Hemoglobin 9.1 (L) 12.0 - 15.0 g/dL   HCT 26.9 (L) 36.0 - 46.0 %   MCV 87.6 78.0 - 100.0 fL   MCH 29.6 26.0 - 34.0 pg   MCHC 33.8 30.0 - 36.0 g/dL   RDW 14.7 11.5 - 15.5 %   Platelets 133 (L) 150 - 400 K/uL    ABGS No results for input(s): PHART, PO2ART, TCO2, HCO3 in the last 72 hours.  Invalid input(s): PCO2 CULTURES Recent Results (from the past 240 hour(s))  MRSA PCR Screening     Status: None   Collection Time: 05/22/16  4:15 PM  Result Value Ref Range Status   MRSA by PCR NEGATIVE NEGATIVE Final    Comment:        The GeneXpert MRSA Assay (FDA approved for NASAL specimens only), is one component of a comprehensive MRSA colonization surveillance program. It is not intended  to diagnose MRSA infection nor to guide or monitor treatment for MRSA infections.    Studies/Results: No results found.  Medications: I have reviewed the patient's current medications.  Assesment:  Principal Problem:   Gastrointestinal hemorrhage Active Problems:   History of cocaine abuse   History of schizoaffective disorder   Bipolar disorder (HCC)   Personality disorder   Tobacco abuse   Alcohol abuse   Abdominal wall abscess   Dieulafoy lesion of duodenum   History of Billroth II operation   GI bleed    Plan:  Medications reviewed Will continue PPI Will supplement KCL Will monitor CBC/BMP.    LOS: 1 day   Rovena Hearld 05/25/2016, 9:29 AM

## 2016-05-26 ENCOUNTER — Telehealth: Payer: Self-pay | Admitting: Gastroenterology

## 2016-05-26 LAB — BASIC METABOLIC PANEL
Anion gap: 4 — ABNORMAL LOW (ref 5–15)
CALCIUM: 8.5 mg/dL — AB (ref 8.9–10.3)
CO2: 23 mmol/L (ref 22–32)
Chloride: 111 mmol/L (ref 101–111)
Creatinine, Ser: 0.37 mg/dL — ABNORMAL LOW (ref 0.44–1.00)
GFR calc Af Amer: >60 mL/min (ref >60)
GLUCOSE: 96 mg/dL (ref 65–99)
POTASSIUM: 3.6 mmol/L (ref 3.5–5.1)
SODIUM: 138 mmol/L (ref 135–145)

## 2016-05-26 LAB — CBC
HCT: 26.1 % — ABNORMAL LOW (ref 36.0–46.0)
Hemoglobin: 9 g/dL — ABNORMAL LOW (ref 12.0–15.0)
MCH: 29.9 pg (ref 26.0–34.0)
MCHC: 34.5 g/dL (ref 30.0–36.0)
MCV: 86.7 fL (ref 78.0–100.0)
PLATELETS: 164 10*3/uL (ref 150–400)
RBC: 3.01 MIL/uL — ABNORMAL LOW (ref 3.87–5.11)
RDW: 14.8 % (ref 11.5–15.5)
WBC: 6.6 10*3/uL (ref 4.0–10.5)

## 2016-05-26 LAB — TYPE AND SCREEN
ABO/RH(D): O POS
ANTIBODY SCREEN: NEGATIVE
UNIT DIVISION: 0
Unit division: 0

## 2016-05-26 LAB — BPAM RBC
Blood Product Expiration Date: 201804072359
Blood Product Expiration Date: 201804092359
UNIT TYPE AND RH: 5100
Unit Type and Rh: 5100

## 2016-05-26 MED ORDER — PANTOPRAZOLE SODIUM 40 MG PO TBEC: 40.0000 mg | DELAYED_RELEASE_TABLET | Freq: Every day | ORAL | Status: DC

## 2016-05-26 MED ORDER — ALBUTEROL SULFATE (2.5 MG/3ML) 0.083% IN NEBU: 2.5000 mg | INHALATION_SOLUTION | RESPIRATORY_TRACT | Status: DC | PRN

## 2016-05-26 NOTE — Progress Notes (Signed)
Patient's IV removed.  Site WNL.  AVS reviewed with patient.  Verbalized understanding of discharge instructions, physician follow-up, medications.  Patient refused wheelchair and ambulated to main entrance accompanied by RN.  Patient reports belongings intact and in possession at time of discharge.  Patient stable at time of discharge.

## 2016-05-26 NOTE — Discharge Summary (Signed)
Physician Discharge Summary  Patient ID: Angel Price MRN: [id number] DOB/AGE: Nov 22, 1965 51 y.o. Primary Care Physician:Safir Michalec, MD Admit date: 05/22/2016 Discharge date: 05/26/2016    Discharge Diagnoses:   Principal Problem:   Gastrointestinal hemorrhage Active Problems:   History of cocaine abuse   History of schizoaffective disorder   Bipolar disorder ([place])   Personality disorder   Tobacco abuse   Alcohol abuse   Abdominal wall abscess   Dieulafoy lesion of duodenum   History of Billroth II operation   GI bleed   Allergies as of 05/26/2016      Reactions   Codeine Hives   Pt states she can tolerate Percocet   Morphine And Related Hives   Gastritis, "Pin Pricks" feeling on Skin      Medication List    STOP taking these medications   doxycycline 100 MG capsule Commonly known as:  VIBRAMYCIN     TAKE these medications   folic acid 1 MG tablet Commonly known as:  FOLVITE Take 1 tablet (1 mg total) by mouth daily.   lisinopril 10 MG tablet Commonly known as:  PRINIVIL,ZESTRIL Take 10 mg by mouth daily.   multivitamin with minerals Tabs tablet Take 1 tablet by mouth daily.   ondansetron 4 MG tablet Commonly known as:  ZOFRAN TAKE (1) TABLET BY MOUTH EVERY SIX HOURS.   pantoprazole 40 MG tablet Commonly known as:  PROTONIX Take 1 tablet (40 mg total) by mouth 2 (two) times daily before a meal. What changed:  when to take this   polyethylene glycol packet Commonly known as:  MIRALAX Take 17 g by mouth daily.   promethazine 25 MG tablet Commonly known as:  PHENERGAN Take 1 tablet (25 mg total) by mouth every 6 (six) hours as needed for nausea or vomiting.   promethazine 25 MG suppository Commonly known as:  PHENERGAN Place 1 suppository (25 mg total) rectally every 6 (six) hours as needed for nausea or vomiting.   thiamine 100 MG tablet Take 1 tablet (100 mg total) by mouth daily.       Discharged Condition: improved    Consults:  GI  Significant Diagnostic Studies: Dg Chest 2 View  Result Date: 05/22/2016 CLINICAL DATA:  Abdominal pain, chest pain, vomiting for 4 days pound EXAM: CHEST  2 VIEW COMPARISON:  04/25/2016 FINDINGS: Cardiomediastinal silhouette is stable. No infiltrate or pleural effusion. No pulmonary edema. Again noted multiple surgical clips in the upper and mid abdomen. IMPRESSION: No active cardiopulmonary disease. Electronically Signed   By: Lahoma Crocker M.D.   On: 05/22/2016 13:53   Ct Abdomen Pelvis W Contrast  Result Date: 05/22/2016 CLINICAL DATA:  Abdominal pain and rectal bleeding. EXAM: CT ABDOMEN AND PELVIS WITH CONTRAST TECHNIQUE: Multidetector CT imaging of the abdomen and pelvis was performed using the standard protocol following bolus administration of intravenous contrast. CONTRAST:  55mL ISOVUE-300 IOPAMIDOL (ISOVUE-300) INJECTION 61%, 134mL ISOVUE-300 IOPAMIDOL (ISOVUE-300) INJECTION 61% COMPARISON:  04/25/2016 FINDINGS: Lower chest: No acute abnormality. Hepatobiliary: Hepatic steatosis. Previous cholecystectomy. Increase caliber of the common bile duct measures 1.1 cm, image 36 of series 4. Previously 9 mm. No obstructing stone or mass noted. Pancreas: Unremarkable. No pancreatic ductal dilatation or surrounding inflammatory changes. Spleen: Normal in size without focal abnormality. Adrenals/Urinary Tract: Previous right adrenalectomy. Nodule in the left adrenal gland measures 2 cm, image number 20 of series 2. This is unchanged from the previous exam. Previously described as representing a benign adenoma. Unremarkable appearance of both kidneys. Urinary bladder appears  normal. Stomach/Bowel: The stomach is normal. The small bowel loops have a normal course and caliber. No pathologic dilatation of the colon. Vascular/Lymphatic: Aortic atherosclerosis. No enlarged upper abdominal or pelvic lymph nodes. Reproductive: Status post hysterectomy. No adnexal masses. Other: Chronic scarring and postsurgical  changes involving the ventral abdominal wall. Musculoskeletal: No aggressive lytic or sclerotic bone lesions. IMPRESSION: 1. No acute findings. 2. Increase caliber of the common bile duct status post cholecystectomy. 3. Stable left adrenal nodule.  Prior right adrenalectomy. 4. Aortic atherosclerosis. Electronically Signed   By: Kerby Moors M.D.   On: 05/22/2016 13:58    Lab Results: Basic Metabolic Panel:  Recent Labs  05/25/16 0608 05/26/16 0554  NA 137 138  K 3.0* 3.6  CL 111 111  CO2 22 23  GLUCOSE 98 96  BUN <5* <5*  CREATININE 0.43* 0.37*  CALCIUM 8.2* 8.5*   Liver Function Tests: No results for input(s): AST, ALT, ALKPHOS, BILITOT, PROT, ALBUMIN in the last 72 hours.   CBC:  Recent Labs  05/25/16 0608 05/26/16 0554  WBC 5.2 6.6  HGB 9.1* 9.0*  HCT 26.9* 26.1*  MCV 87.6 86.7  PLT 133* 164    Recent Results (from the past 240 hour(s))  MRSA PCR Screening     Status: None   Collection Time: 05/22/16  4:15 PM  Result Value Ref Range Status   MRSA by PCR NEGATIVE NEGATIVE Final    Comment:        The GeneXpert MRSA Assay (FDA approved for NASAL specimens only), is one component of a comprehensive MRSA colonization surveillance program. It is not intended to diagnose MRSA infection nor to guide or monitor treatment for MRSA infections.      Hospital Course:   This is a 51 years old female with history of multiple medical illnesses was admitted due to rectal bleed. No significant drop in H/H. Patient was seen by GI and EGD was done. There was no acute bleeding. Patient improved. She is planned for colonoscopy on out patient basis. Patient is discharge in stable condition.  Discharge Exam: Blood pressure (!) 136/95, pulse 88, temperature 97.8 F (36.6 C), temperature source Oral, resp. rate 18, height 5\' 3"  (1.6 m), weight 58.1 kg (128 lb), SpO2 98 %.    Disposition:  home    Follow-up Information    Bralin Garry, MD Follow up in 2 week(s).    Specialty:  Internal Medicine Contact information: [place] [address] [phone]           Signed: Rosita Fire   05/26/2016, 10:43 AM

## 2016-05-26 NOTE — Telephone Encounter (Signed)
OPV IN 4-6 WEEKS , Dx: GI BLEED, ANEMIA. CONSIDER COLONOSCOPY.

## 2016-05-27 ENCOUNTER — Encounter: Payer: Self-pay | Admitting: Internal Medicine

## 2016-05-27 ENCOUNTER — Encounter (HOSPITAL_COMMUNITY): Payer: Self-pay | Admitting: Gastroenterology

## 2016-05-27 NOTE — Telephone Encounter (Signed)
APPT MADE AND LETTER SENT  °

## 2016-07-02 ENCOUNTER — Telehealth: Payer: Self-pay | Admitting: Internal Medicine

## 2016-07-02 ENCOUNTER — Ambulatory Visit: Payer: Medicare HMO | Admitting: Gastroenterology

## 2016-07-02 ENCOUNTER — Encounter: Payer: Self-pay | Admitting: Internal Medicine

## 2016-07-02 NOTE — Telephone Encounter (Signed)
Sent letter to patient.

## 2016-07-02 NOTE — Telephone Encounter (Signed)
Angel Price, please send our "no show policy letter and if the patient no shows after that letter she will be recommended for discharge from the practice

## 2016-07-02 NOTE — Telephone Encounter (Signed)
Patient was a no show to appointment, was spoken to about the appointment the day prior.  Has been a no-show multiple times.

## 2016-07-14 ENCOUNTER — Emergency Department (HOSPITAL_COMMUNITY)
Admission: EM | Admit: 2016-07-14 | Discharge: 2016-07-14 | Disposition: A | Discharge status: Home / Self Care | Payer: Medicare HMO | Attending: Emergency Medicine | Admitting: Emergency Medicine

## 2016-07-14 ENCOUNTER — Encounter (HOSPITAL_COMMUNITY): Payer: Self-pay | Admitting: *Deleted

## 2016-07-14 DIAGNOSIS — R1030 Lower abdominal pain, unspecified: Secondary | ICD-10-CM | POA: Diagnosis not present

## 2016-07-14 DIAGNOSIS — G8929 Other chronic pain: Secondary | ICD-10-CM | POA: Diagnosis not present

## 2016-07-14 DIAGNOSIS — R69 Illness, unspecified: Secondary | ICD-10-CM | POA: Diagnosis not present

## 2016-07-14 DIAGNOSIS — R1010 Upper abdominal pain, unspecified: Secondary | ICD-10-CM | POA: Diagnosis not present

## 2016-07-14 DIAGNOSIS — R000 Tachycardia, unspecified: Secondary | ICD-10-CM | POA: Diagnosis not present

## 2016-07-14 DIAGNOSIS — E039 Hypothyroidism, unspecified: Secondary | ICD-10-CM | POA: Diagnosis not present

## 2016-07-14 DIAGNOSIS — R112 Nausea with vomiting, unspecified: Secondary | ICD-10-CM | POA: Diagnosis not present

## 2016-07-14 DIAGNOSIS — I10 Essential (primary) hypertension: Secondary | ICD-10-CM | POA: Diagnosis not present

## 2016-07-14 DIAGNOSIS — F17210 Nicotine dependence, cigarettes, uncomplicated: Secondary | ICD-10-CM | POA: Insufficient documentation

## 2016-07-14 DIAGNOSIS — R197 Diarrhea, unspecified: Secondary | ICD-10-CM | POA: Insufficient documentation

## 2016-07-14 DIAGNOSIS — R6883 Chills (without fever): Secondary | ICD-10-CM | POA: Insufficient documentation

## 2016-07-14 DIAGNOSIS — Z79899 Other long term (current) drug therapy: Secondary | ICD-10-CM | POA: Diagnosis not present

## 2016-07-14 DIAGNOSIS — E871 Hypo-osmolality and hyponatremia: Secondary | ICD-10-CM | POA: Diagnosis not present

## 2016-07-14 DIAGNOSIS — R630 Anorexia: Secondary | ICD-10-CM | POA: Diagnosis not present

## 2016-07-14 LAB — COMPREHENSIVE METABOLIC PANEL
ALBUMIN: 4.1 g/dL (ref 3.5–5.0)
ALT: 25 U/L (ref 14–54)
ANION GAP: 15 (ref 5–15)
AST: 59 U/L — ABNORMAL HIGH (ref 15–41)
Alkaline Phosphatase: 117 U/L (ref 38–126)
BILIRUBIN TOTAL: 0.8 mg/dL (ref 0.3–1.2)
BUN: 6 mg/dL (ref 6–20)
CHLORIDE: 91 mmol/L — AB (ref 101–111)
CO2: 21 mmol/L — ABNORMAL LOW (ref 22–32)
Calcium: 9.5 mg/dL (ref 8.9–10.3)
Creatinine, Ser: 0.58 mg/dL (ref 0.44–1.00)
GFR calc Af Amer: >60 mL/min (ref >60)
GFR calc non Af Amer: >60 mL/min (ref >60)
GLUCOSE: 73 mg/dL (ref 65–99)
Potassium: 4.5 mmol/L (ref 3.5–5.1)
SODIUM: 127 mmol/L — AB (ref 135–145)
Total Protein: 8.4 g/dL — ABNORMAL HIGH (ref 6.5–8.1)

## 2016-07-14 LAB — CBC WITH DIFFERENTIAL/PLATELET
BASOS ABS: 0 10*3/uL (ref 0.0–0.1)
Basophils Relative: 0 %
Eosinophils Absolute: 0 10*3/uL (ref 0.0–0.7)
Eosinophils Relative: 0 %
HEMATOCRIT: 38.5 % (ref 36.0–46.0)
Hemoglobin: 13.2 g/dL (ref 12.0–15.0)
Lymphocytes Relative: 36 %
Lymphs Abs: 2.3 10*3/uL (ref 0.7–4.0)
MCH: 28.9 pg (ref 26.0–34.0)
MCHC: 34.3 g/dL (ref 30.0–36.0)
MCV: 84.4 fL (ref 78.0–100.0)
MONO ABS: 0.6 10*3/uL (ref 0.1–1.0)
Monocytes Relative: 9 %
Neutro Abs: 3.5 10*3/uL (ref 1.7–7.7)
Neutrophils Relative %: 55 %
Platelets: 201 10*3/uL (ref 150–400)
RBC: 4.56 MIL/uL (ref 3.87–5.11)
RDW: 17.2 % — ABNORMAL HIGH (ref 11.5–15.5)
WBC: 6.4 10*3/uL (ref 4.0–10.5)

## 2016-07-14 LAB — LIPASE, BLOOD: Lipase: 15 U/L (ref 11–51)

## 2016-07-14 MED ORDER — SODIUM CHLORIDE 0.9 % IV BOLUS (SEPSIS)
1000.0000 mL | Freq: Once | INTRAVENOUS | Status: AC
  Administered 2016-07-14: 1000 mL via INTRAVENOUS

## 2016-07-14 MED ORDER — ONDANSETRON 4 MG PO TBDP: 4.0000 mg | ORAL_TABLET | Freq: Three times a day (TID) | ORAL | 0 refills | Status: DC | PRN

## 2016-07-14 MED ORDER — ONDANSETRON HCL 4 MG/2ML IJ SOLN
4.0000 mg | Freq: Once | INTRAMUSCULAR | Status: AC
Start: 2016-07-14 — End: 2016-07-14
  Administered 2016-07-14: 4 mg via INTRAVENOUS
  Filled 2016-07-14: qty 2

## 2016-07-14 NOTE — ED Provider Notes (Signed)
[facility] DEPT Provider Note   CSN: [id number] Arrival date & time: 07/14/16  1528     History   Chief Complaint Chief Complaint  Patient presents with  . Abdominal Pain    HPI Angel Price is a 51 y.o. female.  HPI Patient presents with nausea vomiting and diarrhea. Also upper abdominal pain. Began 4 days ago with abdominal pain. Has had a decreased appetite. Has had some chills without fevers. States it does feel somewhat like her previous pancreatitis. No blood in stool or emesis. Has vomited some mucus. No weight loss. Pain with eating.   I   Past Medical History:  Diagnosis Date  . Abscess    soft tissue  . Adrenal mass ([place])   . Alcohol abuse   . Anxiety   . Blood transfusion without reported diagnosis   . Chronic abdominal pain   . Chronic wound infection of abdomen   . Colon polyp    colonoscopy 04/2014  . Depression   . Diverticulosis    colonoscopy 04/2014 moderat pan colonic  . Gastritis    EGD 05/2014  . Gastroparesis Nov 2015  . GERD (gastroesophageal reflux disease)   . Hemorrhoid    internal large  . Hiatal hernia   . History of Billroth II operation   . Hypertension   . Lung nodule    CT 02/2014 needs repeat 1 month  . Lung nodule < 6cm on CT 04/25/2014  . Lupus   . Nausea and vomiting    chronic, recurrent  . Schatzki's ring    patent per EGD 04/2014  . Sickle cell trait ([place])   . Suicide attempt ([place])   . 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 GI bleed 05/24/2016  . Dieulafoy lesion of duodenum   . History of Billroth II operation   . Gastrointestinal hemorrhage 05/22/2016  . Absolute anemia   . Pancreatitis, acute   . Acute pancreatitis 10/01/2015  . Abnormal CT scan of lung 10/01/2015  . Alcohol intoxication ([place])   . Left-sided weakness   . Psychosomatic factor in physical condition   . Upper GI bleed   . Diverticulosis of colon with hemorrhage   .  Chronic wound infection of abdomen 12/22/2014  . Thyroid disease 12/22/2014  . Hypertension 12/22/2014  . Ataxia 11/01/2014  . Hemorrhoids 04/26/2014  . Lung nodule 04/26/2014  . Diverticulosis   . Gastritis   . Hiatal hernia   . Schatzki's ring   . Acute blood loss anemia 04/25/2014  . Sinus tachycardia 04/25/2014  . Hypokalemia 04/25/2014  . Hyponatremia 04/25/2014  . Hematemesis with nausea 04/25/2014  . Lung nodule < 6cm on CT 04/25/2014  . Intractable nausea and vomiting 04/24/2014  . Gastroparesis   . Abdominal pain 04/01/2014  . Chronic abdominal pain 01/21/2014  . Gastroenteritis 12/10/2013  . Chronic abdominal wound infection 08/16/2013  . MDD (major depressive disorder), recurrent episode, severe ([place]) 06/27/2013  . Wrist laceration 06/24/2013  . Nausea with vomiting 05/07/2013  . Diarrhea 05/07/2013  . Rectal bleeding 05/07/2013  . Abnormal LFTs 05/07/2013  . Adrenal mass, left ([place]) 05/07/2013  . Abdominal wall abscess at site of surgical wound 04/19/2013  . Abdominal wall abscess 04/18/2013  . Frequent headaches 03/30/2013  . Sleep difficulties 03/30/2013  . Essential hypertension, benign 03/30/2013  . History of cocaine abuse 03/16/2013  . History of schizoaffective disorder 03/16/2013  . Bipolar disorder (  [place]) 03/16/2013  . Personality disorder 03/16/2013  . Tobacco abuse 03/16/2013  . Alcohol abuse 03/16/2013  . Palpitations 03/16/2013  . Poor vision 03/16/2013  . History of gastric bypass 03/16/2013  . Status post hysterectomy with oophorectomy 03/16/2013  . Hypothyroid 03/16/2013  . Lupus ([place]) 03/16/2013    Past Surgical History:  Procedure Laterality Date  . ABDOMINAL HYSTERECTOMY  2013   [place]  . ABDOMINAL SURGERY    . ADRENALECTOMY Right   . AGILE CAPSULE N/A 01/05/2015   Procedure: AGILE CAPSULE;  Surgeon: Daneil Dolin, MD;  Location: [facility];  Service: Endoscopy;  Laterality: N/A;  0700  . Billroth II procedure      [place],  first 2000, 2005/2006.  Marland Kitchen BIOPSY  05/20/2013   Procedure: BIOPSIES OF ASCENDING AND SIGMOID COLON;  Surgeon: Daneil Dolin, MD;  Location: [facility] ORS;  Service: Endoscopy;;  . BIOPSY  04/26/2014   Procedure: BIOPSIES;  Surgeon: Danie Binder, MD;  Location: [facility];  Service: Endoscopy;;  . CHOLECYSTECTOMY    . COLONOSCOPY     in [place]  . COLONOSCOPY WITH PROPOFOL N/A 05/20/2013   Dr.Rourk- inadequate prep, normal appearing rectum, grossly normal colon aside from pancolonic diverticula, normal terminal ileum bx= unremarkable colonic mucosa. Due for early interval 2016.   Marland Kitchen COLONOSCOPY WITH PROPOFOL N/A 04/26/2014   GLO:VFIEPP ileum/one colon polyp removed/moderate pan-colonic diverticulosis/large internal hemorrhoids  . COLONOSCOPY WITH PROPOFOL N/A 12/23/2014   Dr.Rourk- minimal internal hemorrhoids, pancolonic diverticulosis  . DEBRIDEMENT OF ABDOMINAL WALL ABSCESS N/A 02/08/2013   Procedure: DEBRIDEMENT OF ABDOMINAL WALL ABSCESS;  Surgeon: Jamesetta So, MD;  Location: [facility];  Service: General;  Laterality: N/A;  . ESOPHAGOGASTRODUODENOSCOPY (EGD) WITH PROPOFOL N/A 05/20/2013   Dr.Rourk- s/p prior gastric surgery with normal esophagus, residual gastric mucosa and patent efferent limb  . ESOPHAGOGASTRODUODENOSCOPY (EGD) WITH PROPOFOL N/A 02/03/2014   Dr. Gala Romney:  s/p hemigastrectomy with retained gastric contents. Residual gastric mucosa and efferent limb appeared normal otherwise. Query gastroparesis.   Marland Kitchen ESOPHAGOGASTRODUODENOSCOPY (EGD) WITH PROPOFOL N/A 04/26/2014   IRJ:JOACZYSA'Y ring/small HH/mild non-erosive gasrtitis/normal anastomosis  . ESOPHAGOGASTRODUODENOSCOPY (EGD) WITH PROPOFOL N/A 12/23/2014   Dr.Rourk- s/p prior hemigastrctomy, active oozing from anastomotic suture site, hemostasis achieved  . ESOPHAGOGASTRODUODENOSCOPY (EGD) WITH PROPOFOL N/A 05/23/2016   Procedure: ESOPHAGOGASTRODUODENOSCOPY (EGD) WITH PROPOFOL;  Surgeon: Danie Binder, MD;  [facility];  Service:  Endoscopy;  Laterality: N/A;  . tendon repar Right    wrist  . WOUND EXPLORATION Right 06/24/2013   Procedure: exploration of traumatic wound right wrist;  Surgeon: Tennis Must, MD;  Location: [facility];  Service: Orthopedics;  Laterality: Right;    OB History    Gravida Para Term Preterm AB Living   4 4 4     3    SAB TAB Ectopic Multiple Live Births                   Home Medications    Prior to Admission medications   Medication Sig Start Date End Date Taking? Authorizing Provider  folic acid (FOLVITE) 1 MG tablet Take 1 tablet (1 mg total) by mouth daily. 10/08/15  Yes Rosita Fire, MD  lisinopril (PRINIVIL,ZESTRIL) 10 MG tablet Take 10 mg by mouth daily.   Yes Historical Provider, MD  ondansetron (ZOFRAN) 4 MG tablet TAKE (1) TABLET BY MOUTH EVERY SIX HOURS. Patient taking differently: Take 1 tablet every six hours as needed for nausea 05/17/16  Yes Mahala Menghini, PA-C  pantoprazole (PROTONIX) 40 MG tablet Take 1 tablet (40 mg total) by mouth 2 (two) times daily before a meal. 09/04/14  Yes Daleen Bo, MD  polyethylene glycol [facility]) packet Take 17 g by mouth daily. Patient taking differently: Take 17 g by mouth daily as needed for mild constipation.  04/25/16  Yes Almyra Free Idol, PA-C  ondansetron (ZOFRAN-ODT) 4 MG disintegrating tablet Take 1 tablet (4 mg total) by mouth every 8 (eight) hours as needed for nausea or vomiting. 07/14/16   Davonna Belling, MD    Family History Family History  Problem Relation Age of Onset  . Brain cancer Son   . Schizophrenia Son   . Cancer Son     brain  . Lung cancer Father   . Cancer Father     mets  . Drug abuse Mother   . Breast cancer Maternal Aunt   . Bipolar disorder Maternal Aunt   . Drug abuse Maternal Aunt   . Colon cancer Maternal Grandmother     late 37s, early 69s  . Drug abuse Sister   . Drug abuse Brother   . Bipolar disorder Paternal Grandfather   . Bipolar disorder Cousin   . Liver disease Neg Hx     Social  History Social History  Substance Use Topics  . Smoking status: Current Every Day Smoker    Packs/day: 0.50    Years: 35.00    Types: Cigarettes  . Smokeless tobacco: Never Used  . Alcohol use 7.2 oz/week    12 Cans of beer per week     Comment: 5 12 oz beers a day     Allergies   Codeine and Morphine and related   Review of Systems Review of Systems  Constitutional: Positive for appetite change and chills.  HENT: Negative for congestion.   Respiratory: Negative for shortness of breath.   Gastrointestinal: Positive for abdominal pain, diarrhea, nausea and vomiting.  Genitourinary: Negative for flank pain.  Musculoskeletal: Negative for back pain.  Skin: Negative for wound.  Neurological: Negative for weakness.  Hematological: Does not bruise/bleed easily.  Psychiatric/Behavioral: Negative for confusion.     Physical Exam Updated Vital Signs BP 103/75   Pulse 91   Temp 98.5 F (36.9 C) (Oral)   Resp 18   Ht 5\' 3"  (1.6 m)   Wt 129 lb (58.5 kg)   SpO2 99%   BMI 22.85 kg/m   Physical Exam  Constitutional: She appears well-developed.  HENT:  Head: Normocephalic.  Eyes: EOM are normal.  Neck: Neck supple.  Cardiovascular:  Mild tachycardia  Pulmonary/Chest: Effort normal.  Abdominal: There is tenderness.  Mild upper abdominal tenderness. No rebound or guarding. Lower abdomen does have some tenderness and dressings over chronic abdominal wound for abdominal abscess.  Musculoskeletal: She exhibits no edema.  Neurological: She is alert.  Skin: Skin is warm. Capillary refill takes less than 2 seconds.  Psychiatric: She has a normal mood and affect.     ED Treatments / Results  Labs (all labs ordered are listed, but only abnormal results are displayed) Labs Reviewed  COMPREHENSIVE METABOLIC PANEL - Abnormal; Notable for the following:       Result Value   Sodium 127 (*)    Chloride 91 (*)    CO2 21 (*)    Total Protein 8.4 (*)    AST 59 (*)    All other  components within normal limits  CBC WITH DIFFERENTIAL/PLATELET - Abnormal; Notable for the following:    RDW  17.2 (*)    All other components within normal limits  LIPASE, BLOOD    EKG  EKG Interpretation None       Radiology No results found.  Procedures Procedures (including critical care time)  Medications Ordered in ED Medications  sodium chloride 0.9 % bolus 1,000 mL (0 mLs Intravenous Stopped 07/14/16 1714)  ondansetron (ZOFRAN) injection 4 mg (4 mg Intravenous Given 07/14/16 1606)  sodium chloride 0.9 % bolus 1,000 mL (0 mLs Intravenous Stopped 07/14/16 1823)     Initial Impression / Assessment and Plan / ED Course  I have reviewed the triage vital signs and the nursing notes.  Pertinent labs & imaging results that were available during my care of the patient were reviewed by me and considered in my medical decision making (see chart for details).     Patient pain in her upper abdomen. Nausea vomiting diarrhea. Mild hyponatremia. Fluid boluses given. Has had a history of some chronic abdominal pain also. Patient later stated she was eager to leave and was discharged home.  Final Clinical Impressions(s) / ED Diagnoses   Final diagnoses:  Pain of upper abdomen  Nausea vomiting and diarrhea    New Prescriptions Discharge Medication List as of 07/14/2016  6:19 PM    START taking these medications   Details  ondansetron (ZOFRAN-ODT) 4 MG disintegrating tablet Take 1 tablet (4 mg total) by mouth every 8 (eight) hours as needed for nausea or vomiting., Starting Sun 07/14/2016, Print         Davonna Belling, MD 07/14/16 2124

## 2016-07-14 NOTE — ED Triage Notes (Signed)
Pt comes in with abdominal pain accompanied by n/v/d 4 days ago. Pt denies any urinary problems. NAD noted in triage.

## 2016-07-14 NOTE — ED Notes (Signed)
Pt reports that ride is here at this time and is wanting to leave before fluids are done.  Pt has appx 150-200cc fluid left in bag.  Dr. Alvino Chapel notified that pt is wanting to leave at this time.  Pt BP 103/75 and Dr. Alvino Chapel okay with discharging at this time. Pt has not had any episodes of emesis during this visit.  Pt alert and oriented at discharge, ambulatory.

## 2016-07-14 NOTE — ED Notes (Signed)
Dr. Alvino Chapel notified of bp 90/66.

## 2016-07-18 IMAGING — CT CT ABD-PELV W/ CM
2 of 5 series · 16 of 46 positions shown, 18 images · IV contrast (Omnipaque 300)
Comparison: 12/21/2014

CLINICAL DATA: UPPER ABD PAIN THAT RADIATES TO LEFT SIDE X 1 WEEK
WITH EMESIS AND DIARRHEA, RECTAL BLEEDING STARTED 12.25.5720, HX
HIATAL HERNIA, SCHATZKI'S RING, LUPUS, HTN, GERD, HEMORRHOIDS, LOSS
OF APPETITE, HYSTER, GB

EXAM:
CT ABDOMEN AND PELVIS WITH CONTRAST
TECHNIQUE: Multidetector CT imaging of the abdomen and pelvis was performed
using the standard protocol following bolus administration of
intravenous contrast.
CONTRAST:  100mL OMNIPAQUE IOHEXOL 300 MG/ML  SOLN

[Series 2: abd_pel_with 5.0 b40f · axial · 0.68mm/px · z∈[-430,-14]mm · 13 of 95 slices shown, 15 images]
[im 6/95  soft-tissue]
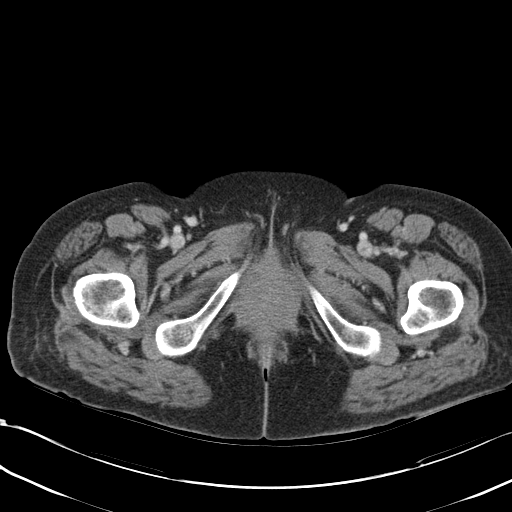
[im 6/95  bone]
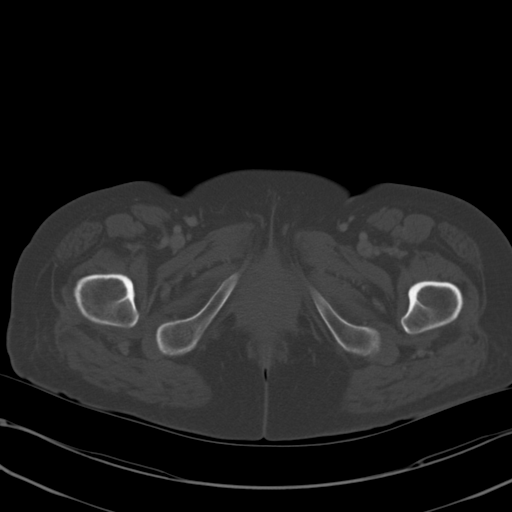
[im 11/95  soft-tissue]
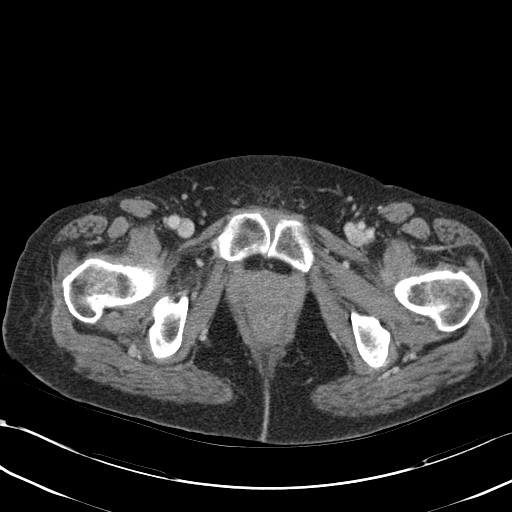
[im 21/95  soft-tissue]
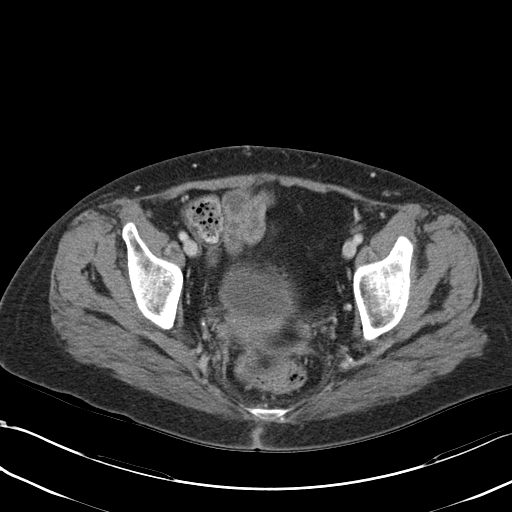
[im 27/95  soft-tissue]
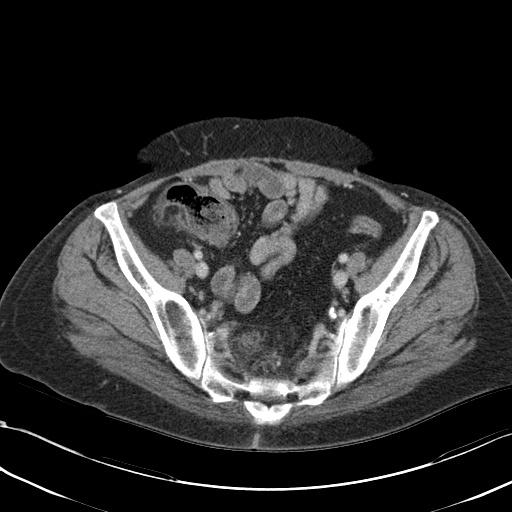
[im 32/95  soft-tissue]
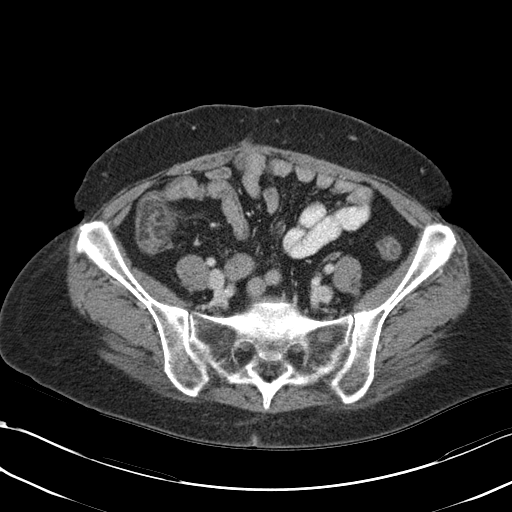
[im 42/95  soft-tissue]
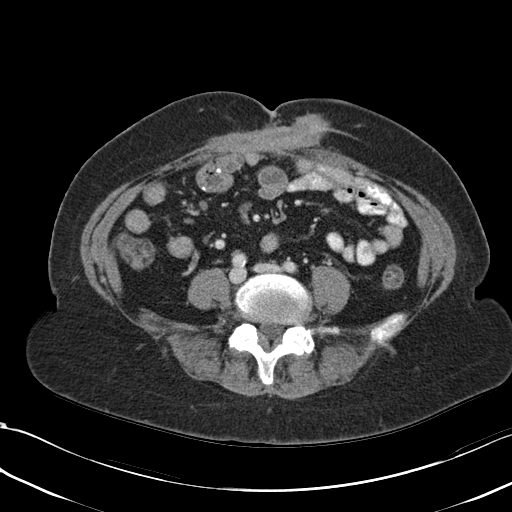
[im 48/95  soft-tissue]
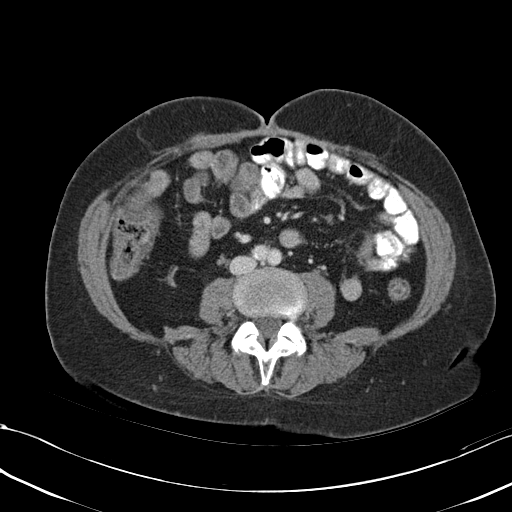
[im 53/95  soft-tissue]
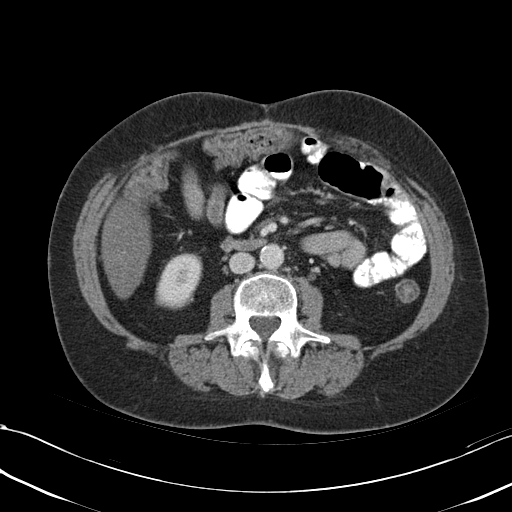
[im 63/95  soft-tissue]
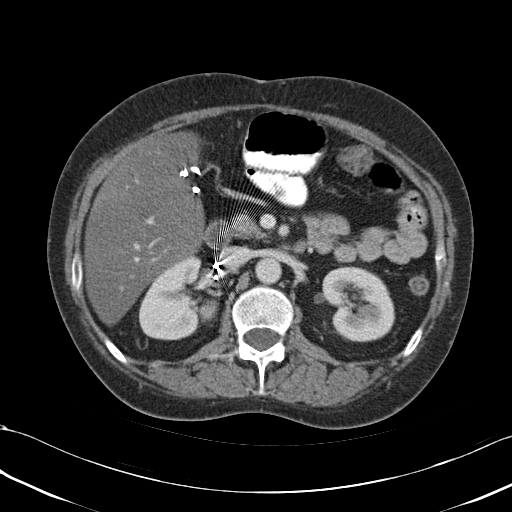
[im 63/95  bone]
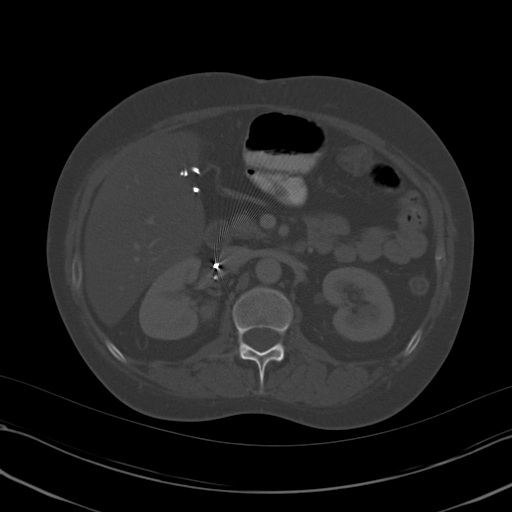
[im 68/95  soft-tissue]
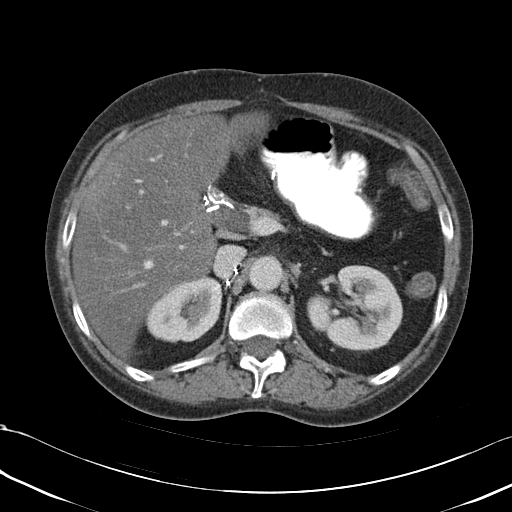
[im 74/95  soft-tissue]
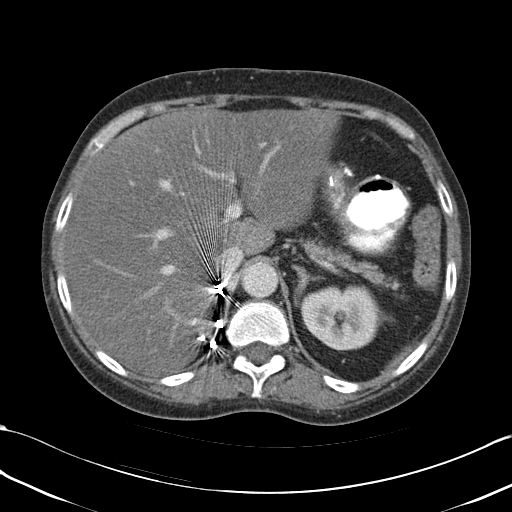
[im 84/95  soft-tissue]
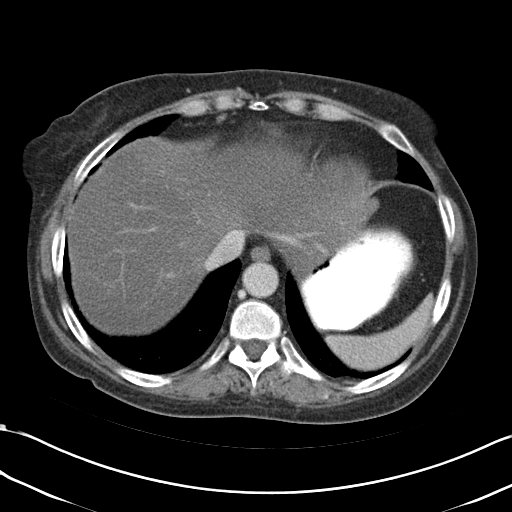
[im 89/95  soft-tissue]
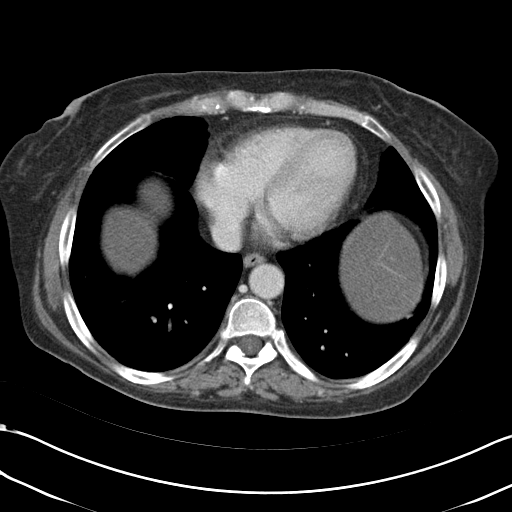

[Series 4: abd_pel_with 3.0 spo cor · coronal · 0.70mm/px · 3 of 78 slices shown]
[im 26/78  soft-tissue]
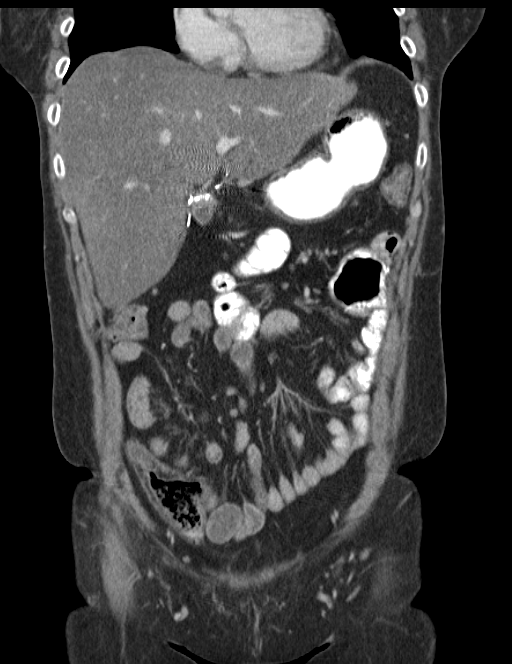
[im 35/78  soft-tissue]
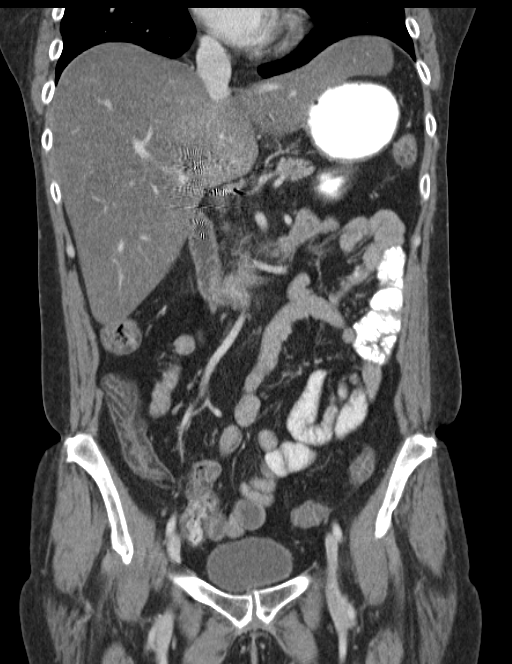
[im 43/78  soft-tissue]
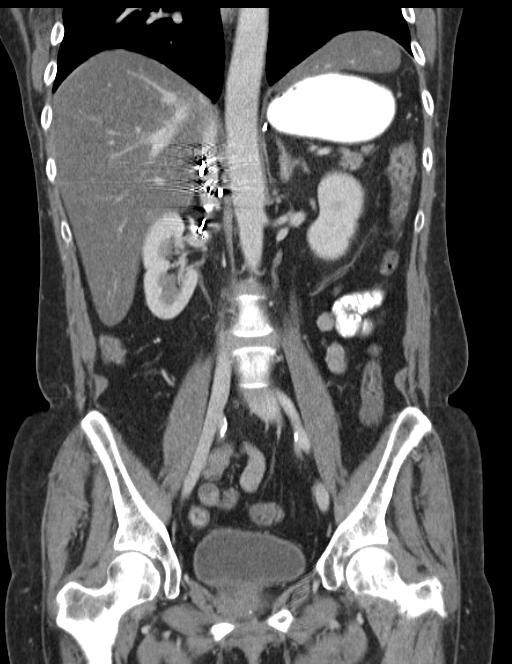

[16 of 46 positions shown; findings below may reference images not displayed]

FINDINGS: Minimal dependent atelectasis posteriorly at the left lung base.
Surgical clips around the GE junction. Surgical clips in the right
adrenalectomy bed. Surgical changes of Billroth 2 procedure.

Fatty liver without focal lesion.

Unremarkable spleen, pancreas, kidneys.

2 cm low-attenuation left adrenal nodule, stable.

Residual stomach, small bowel, and colon are nondilated. Appendix
not identified.

Urinary bladder physiologically distended. Bilateral pelvic
phleboliths. No ascites. No free air. No adenopathy. Portal vein
patent. Mild scattered aortoiliac arterial plaque without aneurysm
or stenosis.

In the anterior abdominal wall at the level of the emboli kiss are
linear areas of streaky scarring with components extending from the
abdominal wall fascia through the subcutaneous fat to the skin,
stable since prior study.

Regional bones unremarkable.
IMPRESSION: 1. No acute abdominal process.
2. Stable intra abdominal postoperative changes and anterior
abdominal wall scarring.
3. Fatty liver
4. Stable left adrenal adenoma.

## 2016-07-21 IMAGING — CT CT CHEST W/O CM
2 of 4 series · 12 of 36 positions shown, 15 images · non-contrast
Comparison: Abdomen and pelvis CT from 03/13/2015 and chest CT from
03/02/2014

CLINICAL DATA: Initial encounter for loss of consciousness for 10
minutes.

EXAM:
CT CHEST, ABDOMEN AND PELVIS WITHOUT CONTRAST
TECHNIQUE: Multidetector CT imaging of the chest, abdomen and pelvis was
performed following the standard protocol without IV contrast.

[Series 2: cap w/o 5.0 b40f · axial · non-contrast · 0.67mm/px · z∈[-460,+60]mm · 9 of 124 slices shown, 12 images]
[im 13/124  mediastinal]
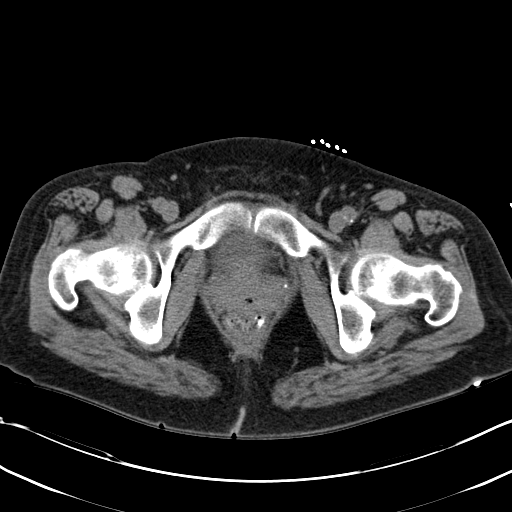
[im 13/124  lung]
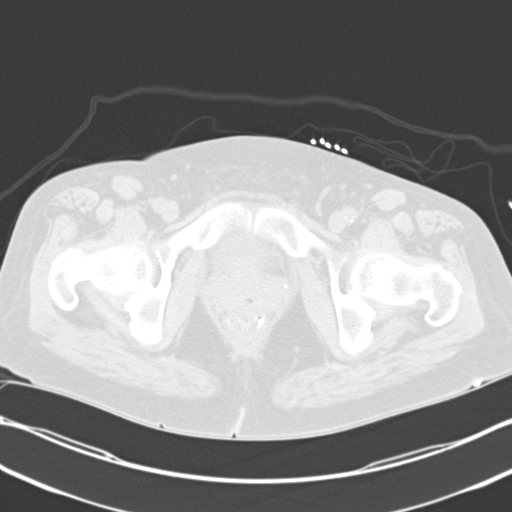
[im 26/124  lung]
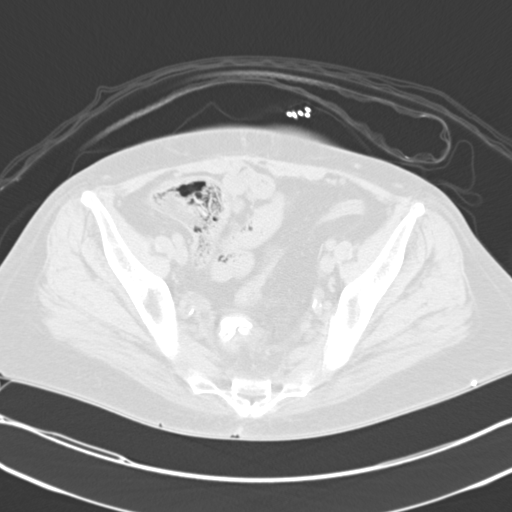
[im 39/124  lung]
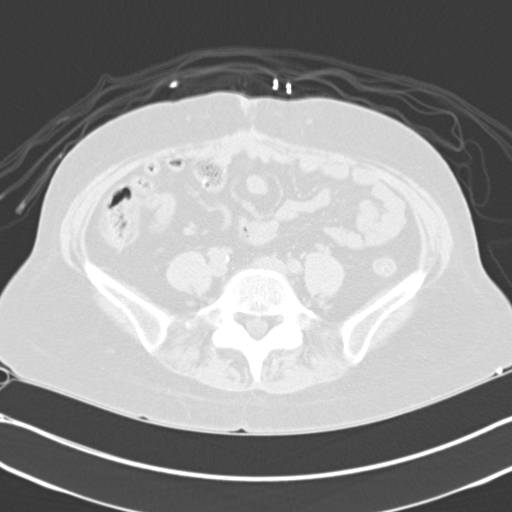
[im 52/124  lung]
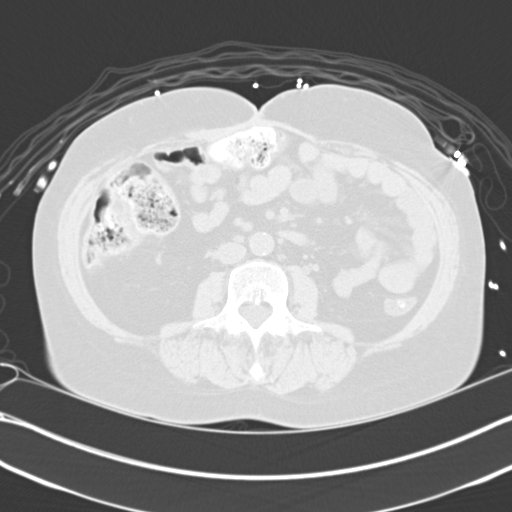
[im 65/124  mediastinal]
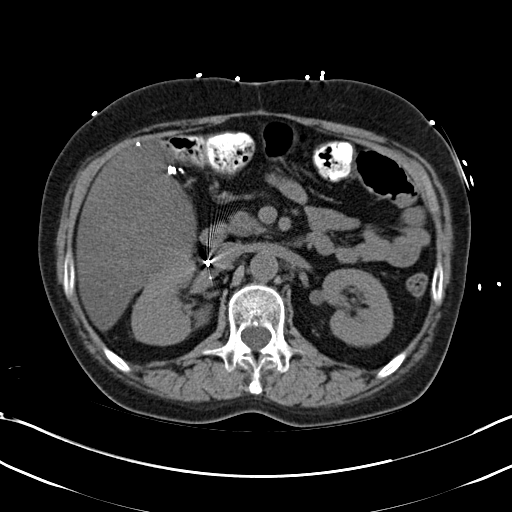
[im 65/124  lung]
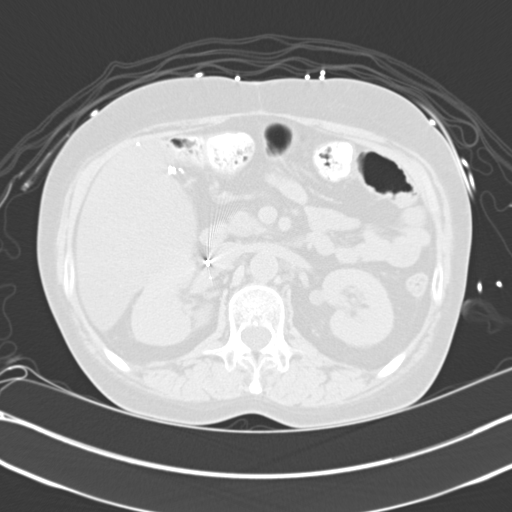
[im 78/124  lung]
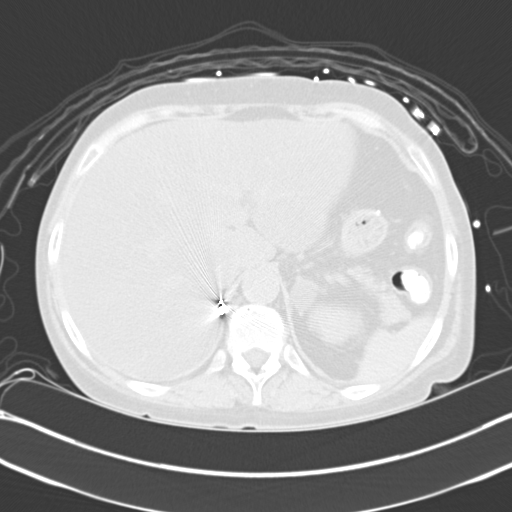
[im 91/124  lung]
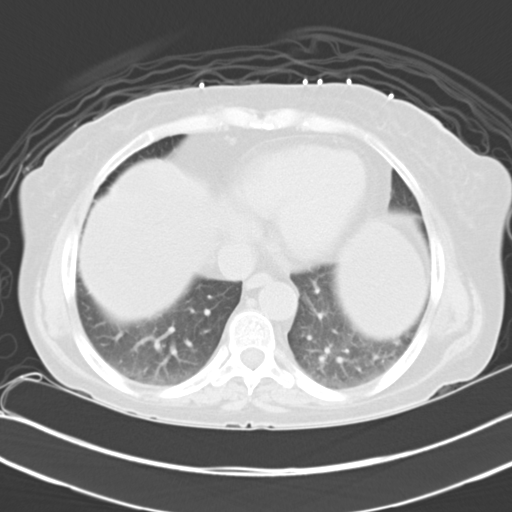
[im 104/124  lung]
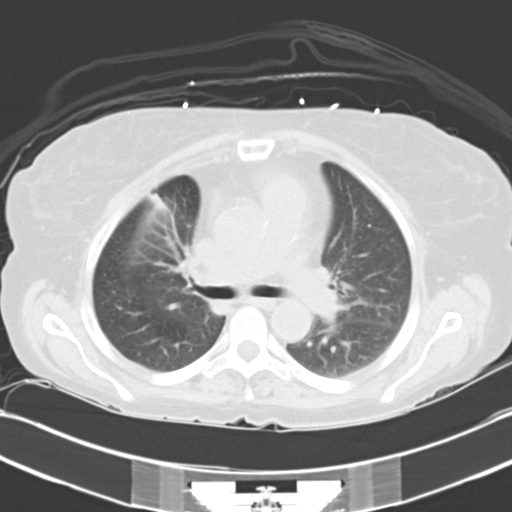
[im 117/124  mediastinal]
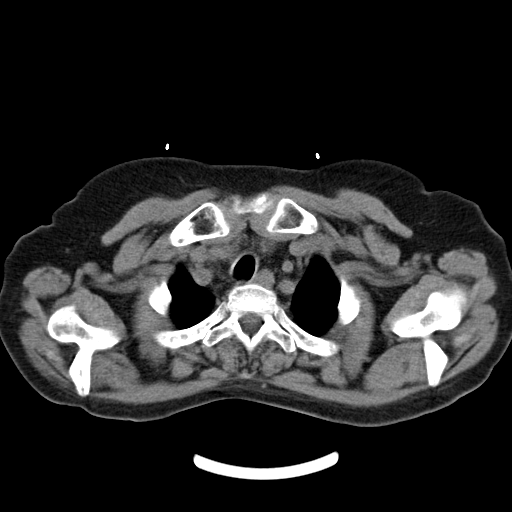
[im 117/124  lung]
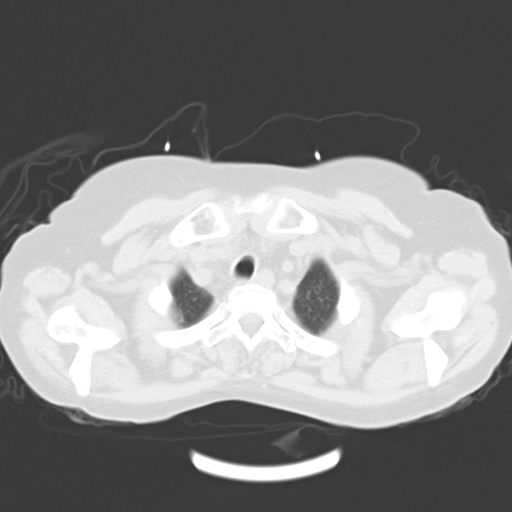

[Series 4: mpr coro cap 3.0mm · coronal · 0.66mm/px · 3 of 76 slices shown]
[im 16/76  lung]
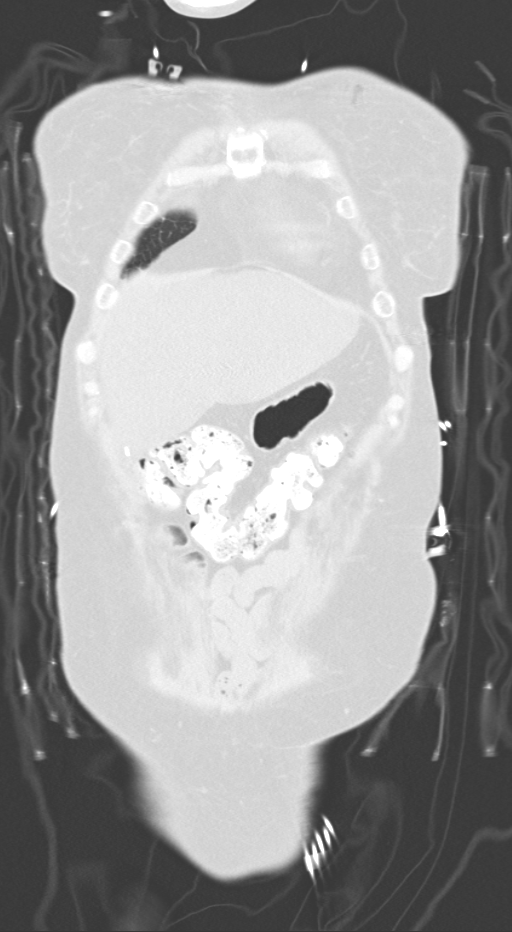
[im 31/76  lung]
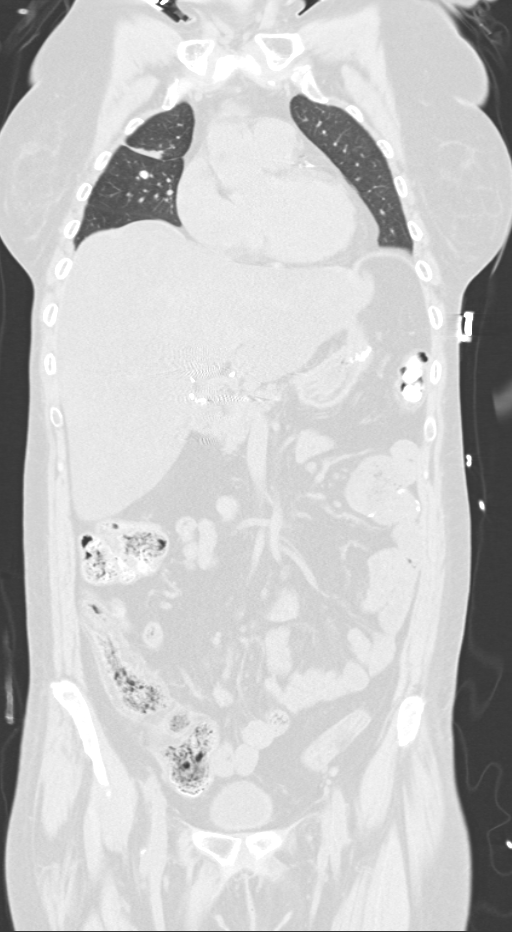
[im 46/76  lung]
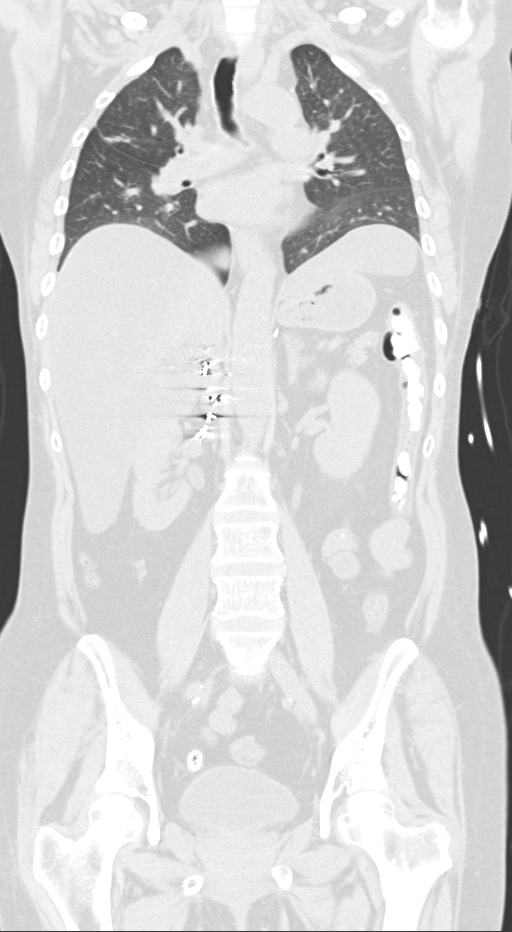

[12 of 36 positions shown; findings below may reference images not displayed]

FINDINGS: CT CHEST

Mediastinum / Lymph Nodes: There is no axillary lymphadenopathy. No
mediastinal lymphadenopathy. There is no hilar lymphadenopathy.
Heart size is upper normal. Coronary artery calcification is noted.
Surgical clips noted around the distal esophagus. Esophagus
otherwise unremarkable.

Lungs / Pleura: Linear scarring in the right upper lobe is unchanged
in the interval. Along the posterior margin of this band of scarring
is a 10 x 8 mm irregular nodular density which is also stable and
likely related to the associated scarring. 7 mm right middle lobe
pulmonary nodule was 8 mm previously and demonstrates some speckled
calcification on today's exam. No focal airspace consolidation. No
pulmonary edema or pleural effusion. Compressive atelectasis noted
in the lower lungs dependently.

[HOSPITAL] / Soft Tissues: Bone windows reveal no worrisome lytic or
sclerotic osseous lesions.

CT ABDOMEN AND PELVIS

Hepatobiliary: Liver shows diffuse fatty deposition and is enlarged
at 20.7 cm craniocaudal length. Gallbladder surgically absent. No
intrahepatic or extrahepatic biliary dilation.

Pancreas: No focal mass lesion. No dilatation of the main duct. No
intraparenchymal cyst. No peripancreatic edema.

Spleen: No splenomegaly. No focal mass lesion.

Adrenals/Urinary Tract: 2.1 cm left adrenal adenoma again noted.
Right adrenal gland is surgically absent. Kidneys are unremarkable.
No evidence for hydroureter. The urinary bladder appears normal for
the degree of distention.

Stomach/Bowel: Postsurgical change noted in the stomach with
apparent distal gastrectomy. Duodenum unremarkable. No small bowel
wall thickening. No small bowel dilatation. Small bowel anastomosis
seen left abdomen. The terminal ileum is normal. The appendix is not
visualized, but there is no edema or inflammation in the region of
the cecum. Contrast material in the colon compatible with CT scan 3
days ago.

Vascular/Lymphatic: There is abdominal aortic atherosclerosis
without aneurysm. There is no gastrohepatic or hepatoduodenal
ligament lymphadenopathy. No intraperitoneal or retroperitoneal
lymphadenopy. No pelvic sidewall lymphadenopathy.

Reproductive: Uterus surgically absent.  There is no adnexal mass.

Other: No intraperitoneal free fluid.

Musculoskeletal: 8.0 x 1.5 cm area of scarring and potential fluid
collection in the midline anterior abdominal wall is unchanged in
the interval. Bone windows reveal no worrisome lytic or sclerotic
osseous lesions.
IMPRESSION: 1. Stable CT scan of the chest. Right upper lobe scarring including
an irregular nodular component to the scarring seen posteriorly,
unchanged since 02/20/2014. Right middle lobe pulmonary nodule also
stable.
2. No acute findings in the abdomen or pelvis.
3. Stable appearance of presumed scarring in the midline anterior
abdominal wall given the stability since 03/02/2014.
[DATE]. Steatosis.
5. Stable left adrenal adenoma.

## 2016-08-01 ENCOUNTER — Encounter (HOSPITAL_COMMUNITY): Payer: Self-pay | Admitting: Emergency Medicine

## 2016-08-01 ENCOUNTER — Emergency Department (HOSPITAL_COMMUNITY)
Admission: EM | Admit: 2016-08-01 | Discharge: 2016-08-01 | Disposition: A | Discharge status: Home / Self Care | Payer: Medicare HMO | Attending: Emergency Medicine | Admitting: Emergency Medicine

## 2016-08-01 ENCOUNTER — Emergency Department (HOSPITAL_COMMUNITY): Payer: Medicare HMO

## 2016-08-01 DIAGNOSIS — Y939 Activity, unspecified: Secondary | ICD-10-CM | POA: Insufficient documentation

## 2016-08-01 DIAGNOSIS — E039 Hypothyroidism, unspecified: Secondary | ICD-10-CM | POA: Insufficient documentation

## 2016-08-01 DIAGNOSIS — S4991XA Unspecified injury of right shoulder and upper arm, initial encounter: Secondary | ICD-10-CM | POA: Diagnosis present

## 2016-08-01 DIAGNOSIS — W19XXXA Unspecified fall, initial encounter: Secondary | ICD-10-CM | POA: Insufficient documentation

## 2016-08-01 DIAGNOSIS — Y929 Unspecified place or not applicable: Secondary | ICD-10-CM | POA: Diagnosis not present

## 2016-08-01 DIAGNOSIS — S40021A Contusion of right upper arm, initial encounter: Secondary | ICD-10-CM | POA: Diagnosis not present

## 2016-08-01 DIAGNOSIS — Y999 Unspecified external cause status: Secondary | ICD-10-CM | POA: Diagnosis not present

## 2016-08-01 DIAGNOSIS — I10 Essential (primary) hypertension: Secondary | ICD-10-CM | POA: Insufficient documentation

## 2016-08-01 DIAGNOSIS — F17210 Nicotine dependence, cigarettes, uncomplicated: Secondary | ICD-10-CM | POA: Insufficient documentation

## 2016-08-01 DIAGNOSIS — M79631 Pain in right forearm: Secondary | ICD-10-CM | POA: Diagnosis not present

## 2016-08-01 DIAGNOSIS — S59911A Unspecified injury of right forearm, initial encounter: Secondary | ICD-10-CM | POA: Diagnosis not present

## 2016-08-01 DIAGNOSIS — R69 Illness, unspecified: Secondary | ICD-10-CM | POA: Diagnosis not present

## 2016-08-01 MED ORDER — CYCLOBENZAPRINE HCL 10 MG PO TABS
10.0000 mg | ORAL_TABLET | Freq: Once | ORAL | Status: AC
  Administered 2016-08-01: 10 mg via ORAL
  Filled 2016-08-01: qty 1

## 2016-08-01 MED ORDER — CYCLOBENZAPRINE HCL 10 MG PO TABS: 10.0000 mg | ORAL_TABLET | Freq: Two times a day (BID) | ORAL | 0 refills | Status: DC | PRN

## 2016-08-01 MED ORDER — IBUPROFEN 800 MG PO TABS
800.0000 mg | ORAL_TABLET | Freq: Once | ORAL | Status: AC
  Administered 2016-08-01: 800 mg via ORAL
  Filled 2016-08-01: qty 1

## 2016-08-01 MED ORDER — ACETAMINOPHEN 325 MG PO TABS
650.0000 mg | ORAL_TABLET | Freq: Once | ORAL | Status: AC
  Administered 2016-08-01: 650 mg via ORAL
  Filled 2016-08-01: qty 2

## 2016-08-01 MED ORDER — IBUPROFEN 600 MG PO TABS: 600.0000 mg | ORAL_TABLET | Freq: Four times a day (QID) | ORAL | 0 refills | Status: DC

## 2016-08-01 NOTE — Discharge Instructions (Signed)
Your x-ray is negative. Your pulses are full. Theresensory or neurologic deficits appreciated on your examination. Please use an Ace bandage to your elbow please use an Ace bandage to your wrists, and please use the sling and ice pack until the symptoms have improved. Please see Dr. Legrand Rams for additional evaluation and management.

## 2016-08-01 NOTE — ED Provider Notes (Signed)
[facility] DEPT Provider Note   CSN: [id number] Arrival date & time: 08/01/16  1334     History   Chief Complaint Chief Complaint  Patient presents with  . Fall    HPI Angel Price is a 51 y.o. female.  Patient is a 51 year old female who presents to the emergency department with a complaint of left arm pain.  The patient states that approximately 4 hours prior to her arrival in the emergency department, she injured her left arm during a fall. She states that she feels over legs female gone out from under her and she was breaking her fall with her left arm. She now complains of pain from the shoulder down to the wrist. She states that when she tries to extend her arm or bend her wrist she has increasing pain. She denies being on any anticoagulation medications. She's not had any previous operations or procedures on the left wrist.      Past Medical History:  Diagnosis Date  . Abscess    soft tissue  . Adrenal mass ([place])   . Alcohol abuse   . Anxiety   . Blood transfusion without reported diagnosis   . Chronic abdominal pain   . Chronic wound infection of abdomen   . Colon polyp    colonoscopy 04/2014  . Depression   . Diverticulosis    colonoscopy 04/2014 moderat pan colonic  . Gastritis    EGD 05/2014  . Gastroparesis Nov 2015  . GERD (gastroesophageal reflux disease)   . Hemorrhoid    internal large  . Hiatal hernia   . History of Billroth II operation   . Hypertension   . Lung nodule    CT 02/2014 needs repeat 1 month  . Lung nodule < 6cm on CT 04/25/2014  . Lupus   . Nausea and vomiting    chronic, recurrent  . Schatzki's ring    patent per EGD 04/2014  . Sickle cell trait ([place])   . Suicide attempt ([place])   . 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 GI bleed 05/24/2016  . Dieulafoy lesion of duodenum   . History of Billroth II operation   . Gastrointestinal hemorrhage 05/22/2016  .  Absolute anemia   . Pancreatitis, acute   . Acute pancreatitis 10/01/2015  . Abnormal CT scan of lung 10/01/2015  . Alcohol intoxication ([place])   . Left-sided weakness   . Psychosomatic factor in physical condition   . Upper GI bleed   . Diverticulosis of colon with hemorrhage   . Chronic wound infection of abdomen 12/22/2014  . Thyroid disease 12/22/2014  . Hypertension 12/22/2014  . Ataxia 11/01/2014  . Hemorrhoids 04/26/2014  . Lung nodule 04/26/2014  . Diverticulosis   . Gastritis   . Hiatal hernia   . Schatzki's ring   . Acute blood loss anemia 04/25/2014  . Sinus tachycardia 04/25/2014  . Hypokalemia 04/25/2014  . Hyponatremia 04/25/2014  . Hematemesis with nausea 04/25/2014  . Lung nodule < 6cm on CT 04/25/2014  . Intractable nausea and vomiting 04/24/2014  . Gastroparesis   . Abdominal pain 04/01/2014  . Chronic abdominal pain 01/21/2014  . Gastroenteritis 12/10/2013  . Chronic abdominal wound infection 08/16/2013  . MDD (major depressive disorder), recurrent episode, severe ([place]) 06/27/2013  . Wrist laceration 06/24/2013  . Nausea with vomiting 05/07/2013  . Diarrhea 05/07/2013  . Rectal bleeding 05/07/2013  . Abnormal LFTs 05/07/2013  .  Adrenal mass, left ([place]) 05/07/2013  . Abdominal wall abscess at site of surgical wound 04/19/2013  . Abdominal wall abscess 04/18/2013  . Frequent headaches 03/30/2013  . Sleep difficulties 03/30/2013  . Essential hypertension, benign 03/30/2013  . History of cocaine abuse 03/16/2013  . History of schizoaffective disorder 03/16/2013  . Bipolar disorder ([place]) 03/16/2013  . Personality disorder 03/16/2013  . Tobacco abuse 03/16/2013  . Alcohol abuse 03/16/2013  . Palpitations 03/16/2013  . Poor vision 03/16/2013  . History of gastric bypass 03/16/2013  . Status post hysterectomy with oophorectomy 03/16/2013  . Hypothyroid 03/16/2013  . Lupus ([place]) 03/16/2013    Past Surgical History:  Procedure Laterality Date  .  ABDOMINAL HYSTERECTOMY  2013   [place]  . ABDOMINAL SURGERY    . ADRENALECTOMY Right   . AGILE CAPSULE N/A 01/05/2015   Procedure: AGILE CAPSULE;  Surgeon: Daneil Dolin, MD;  Location: [facility];  Service: Endoscopy;  Laterality: N/A;  0700  . Billroth II procedure      [place], first 2000, 2005/2006.  Marland Kitchen BIOPSY  05/20/2013   Procedure: BIOPSIES OF ASCENDING AND SIGMOID COLON;  Surgeon: Daneil Dolin, MD;  Location: [facility];  Service: Endoscopy;;  . BIOPSY  04/26/2014   Procedure: BIOPSIES;  Surgeon: Danie Binder, MD;  Location: [facility];  Service: Endoscopy;;  . CHOLECYSTECTOMY    . COLONOSCOPY     in [place]  . COLONOSCOPY WITH PROPOFOL N/A 05/20/2013   Dr.Rourk- inadequate prep, normal appearing rectum, grossly normal colon aside from pancolonic diverticula, normal terminal ileum bx= unremarkable colonic mucosa. Due for early interval 2016.   Marland Kitchen COLONOSCOPY WITH PROPOFOL N/A 04/26/2014   AUQ:JFHLKT ileum/one colon polyp removed/moderate pan-colonic diverticulosis/large internal hemorrhoids  . COLONOSCOPY WITH PROPOFOL N/A 12/23/2014   Dr.Rourk- minimal internal hemorrhoids, pancolonic diverticulosis  . DEBRIDEMENT OF ABDOMINAL WALL ABSCESS N/A 02/08/2013   Procedure: DEBRIDEMENT OF ABDOMINAL WALL ABSCESS;  Surgeon: Jamesetta So, MD;  Location: [facility];  Service: General;  Laterality: N/A;  . ESOPHAGOGASTRODUODENOSCOPY (EGD) WITH PROPOFOL N/A 05/20/2013   Dr.Rourk- s/p prior gastric surgery with normal esophagus, residual gastric mucosa and patent efferent limb  . ESOPHAGOGASTRODUODENOSCOPY (EGD) WITH PROPOFOL N/A 02/03/2014   Dr. Gala Romney:  s/p hemigastrectomy with retained gastric contents. Residual gastric mucosa and efferent limb appeared normal otherwise. Query gastroparesis.   Marland Kitchen ESOPHAGOGASTRODUODENOSCOPY (EGD) WITH PROPOFOL N/A 04/26/2014   GYB:WLSLHTDS'K ring/small HH/mild non-erosive gasrtitis/normal anastomosis  . ESOPHAGOGASTRODUODENOSCOPY (EGD) WITH PROPOFOL N/A 12/23/2014    Dr.Rourk- s/p prior hemigastrctomy, active oozing from anastomotic suture site, hemostasis achieved  . ESOPHAGOGASTRODUODENOSCOPY (EGD) WITH PROPOFOL N/A 05/23/2016   Procedure: ESOPHAGOGASTRODUODENOSCOPY (EGD) WITH PROPOFOL;  Surgeon: Danie Binder, MD;  Location[facility];  Service: Endoscopy;  Laterality: N/A;  . tendon repar Right    wrist  . WOUND EXPLORATION Right 06/24/2013   Procedure: exploration of traumatic wound right wrist;  Surgeon: Tennis Must, MD;  Location: [facility];  Service: Orthopedics;  Laterality: Right;    OB History    Gravida Para Term Preterm AB Living   4 4 4     3    SAB TAB Ectopic Multiple Live Births                   Home Medications    Prior to Admission medications   Medication Sig Start Date End Date Taking? Authorizing Provider  folic acid (FOLVITE) 1 MG tablet Take 1 tablet (1 mg total) by mouth daily. 10/08/15  Rosita Fire, MD  lisinopril (PRINIVIL,ZESTRIL) 10 MG tablet Take 10 mg by mouth daily.    [provider]  ondansetron (ZOFRAN) 4 MG tablet TAKE (1) TABLET BY MOUTH EVERY SIX HOURS. Patient taking differently: Take 1 tablet every six hours as needed for nausea 05/17/16   Mahala Menghini, PA-C  ondansetron (ZOFRAN-ODT) 4 MG disintegrating tablet Take 1 tablet (4 mg total) by mouth every 8 (eight) hours as needed for nausea or vomiting. 07/14/16   Davonna Belling, MD  pantoprazole (PROTONIX) 40 MG tablet Take 1 tablet (40 mg total) by mouth 2 (two) times daily before a meal. 09/04/14   Daleen Bo, MD  polyethylene glycol [facility]) packet Take 17 g by mouth daily. Patient taking differently: Take 17 g by mouth daily as needed for mild constipation.  04/25/16   Evalee Jefferson, PA-C    Family History Family History  Problem Relation Age of Onset  . Brain cancer Son   . Schizophrenia Son   . Cancer Son        brain  . Lung cancer Father   . Cancer Father        mets  . Drug abuse Mother   . Breast cancer Maternal Aunt   .  Bipolar disorder Maternal Aunt   . Drug abuse Maternal Aunt   . Colon cancer Maternal Grandmother        late 21s, early 19s  . Drug abuse Sister   . Drug abuse Brother   . Bipolar disorder Paternal Grandfather   . Bipolar disorder Cousin   . Liver disease Neg Hx     Social History Social History  Substance Use Topics  . Smoking status: Current Every Day Smoker    Packs/day: 0.50    Years: 35.00    Types: Cigarettes  . Smokeless tobacco: Never Used  . Alcohol use 7.2 oz/week    12 Cans of beer per week     Comment: 5 12 oz beers a day     Allergies   Codeine and Morphine and related   Review of Systems Review of Systems  Constitutional: Negative for activity change and appetite change.  HENT: Negative for congestion, ear discharge, ear pain, facial swelling, nosebleeds, rhinorrhea, sneezing and tinnitus.   Eyes: Negative for photophobia, pain and discharge.  Respiratory: Negative for cough, choking, shortness of breath and wheezing.   Cardiovascular: Negative for chest pain, palpitations and leg swelling.  Gastrointestinal: Negative for abdominal pain, blood in stool, constipation, diarrhea, nausea and vomiting.  Genitourinary: Negative for difficulty urinating, dysuria, flank pain, frequency and hematuria.  Musculoskeletal: Negative for back pain, gait problem, myalgias and neck pain.  Skin: Negative for color change, rash and wound.  Neurological: Negative for dizziness, seizures, syncope, facial asymmetry, speech difficulty, weakness and numbness.  Hematological: Negative for adenopathy. Does not bruise/bleed easily.  Psychiatric/Behavioral: Negative for agitation, confusion, hallucinations, self-injury and suicidal ideas. The patient is nervous/anxious.      Physical Exam Updated Vital Signs BP 101/74 (BP Location: Left Arm)   Pulse (!) 109   Temp 99.3 F (37.4 C) (Oral)   Resp 16   Ht 5\' 3"  (1.6 m)   Wt 58.5 kg   SpO2 100%   BMI 22.85 kg/m   Physical  Exam  Constitutional: She is oriented to person, place, and time. She appears well-developed and well-nourished.  Non-toxic appearance.  HENT:  Head: Normocephalic.  Right Ear: Tympanic membrane and external ear normal.  Left Ear: Tympanic membrane and  external ear normal.  Eyes: EOM and lids are normal. Pupils are equal, round, and reactive to light.  Neck: Normal range of motion. Neck supple. Carotid bruit is not present.  Cardiovascular: Normal rate, regular rhythm, normal heart sounds, intact distal pulses and normal pulses.   Pulmonary/Chest: Breath sounds normal. No respiratory distress.  Abdominal: Soft. Bowel sounds are normal. There is no tenderness. There is no guarding.  Musculoskeletal: Normal range of motion.  There is decreased range of motion of the right shoulder due to pain. This no evidence for dislocation. There is no deformity of the clavicle area with a scapular area. There is no deformity of the humerus. There is no effusion or deformity of the elbow. There is pain to palpation of the forearm and wrist on the right. There is no palpable deformity appreciated. The radial pulses 2+. There is good range of motion of all fingers. Capillary refill is less than 2 seconds.  Lymphadenopathy:       Head (right side): No submandibular adenopathy present.       Head (left side): No submandibular adenopathy present.    She has no cervical adenopathy.  Neurological: She is alert and oriented to person, place, and time. She has normal strength. No cranial nerve deficit or sensory deficit.  Skin: Skin is warm and dry.  Psychiatric: She has a normal mood and affect. Her speech is normal.  Nursing note and vitals reviewed.    ED Treatments / Results  Labs (all labs ordered are listed, but only abnormal results are displayed) Labs Reviewed - No data to display  EKG  EKG Interpretation None       Radiology No results found.  Procedures Procedures (including critical care  time)  Medications Ordered in ED Medications - No data to display   Initial Impression / Assessment and Plan / ED Course  I have reviewed the triage vital signs and the nursing notes.  Pertinent labs & imaging results that were available during my care of the patient were reviewed by me and considered in my medical decision making (see chart for details).      Final Clinical Impressions(s) / ED Diagnoses MDM Vital signs are within normal limits. X-ray of the forearm is negative for fracture or dislocation. There is no elbow effusion appreciated. Suspect the patient has a contusion.  I discussed the x-ray findings and the clinical findings with the patient in terms which he understands. The patient will follow-up with Dr. Legrand Rams if not improving.    Final diagnoses:  Contusion of right upper extremity, initial encounter    New Prescriptions Discharge Medication List as of 08/01/2016  3:29 PM       Lily Kocher, PA-C 08/02/16 Riddle, [place], DO 08/06/16 [phone]

## 2016-08-01 NOTE — ED Triage Notes (Signed)
Patient c/o left arm pain after falling today. Patient states "my legs gave way." Per patient falling is not new and happens intermittently and has been seen for it. Patient denies dizziness, hitting head, or LOC. No obvious deformities noted. Radial pulse present.

## 2016-08-03 IMAGING — CT CT HEAD W/O CM
1 series · 16 of 30 positions shown, 20 images · non-contrast
Comparison: 03/16/2015

CLINICAL DATA: The drinking alcohol [REDACTED]. Started having
chest pains, shortness breath and vomiting.

EXAM:
CT HEAD WITHOUT CONTRAST
TECHNIQUE: Contiguous axial images were obtained from the base of the skull
through the vertex without intravenous contrast.

[Series 2: headseq 4.8 h37s · axial · 0.43mm/px · z∈[+1152,+1307]mm · 16 of 36 slices shown, 20 images]
[im 2/36  brain]
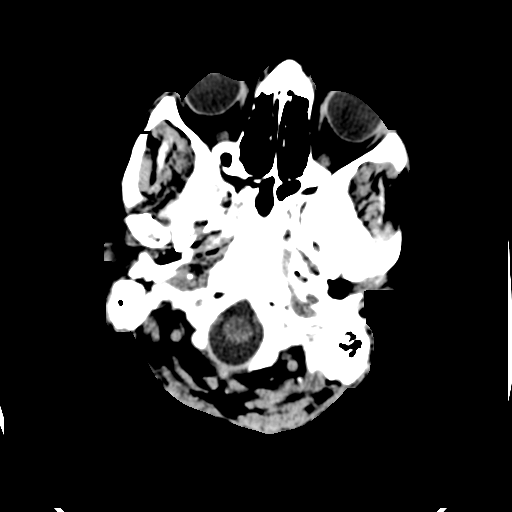
[im 2/36  bone]
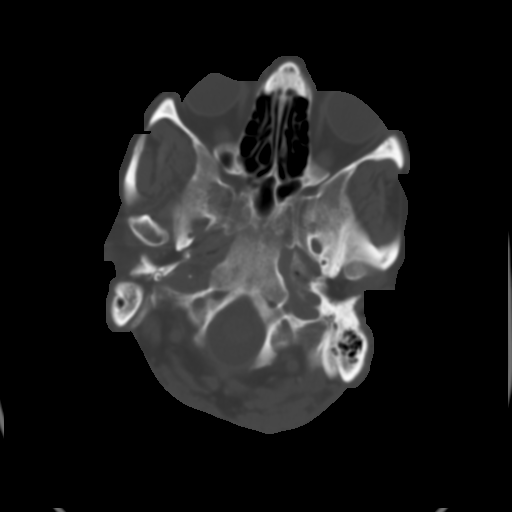
[im 4/36  brain]
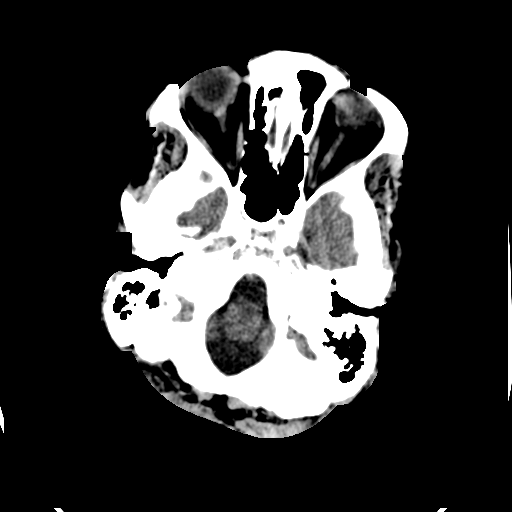
[im 7/36  brain]
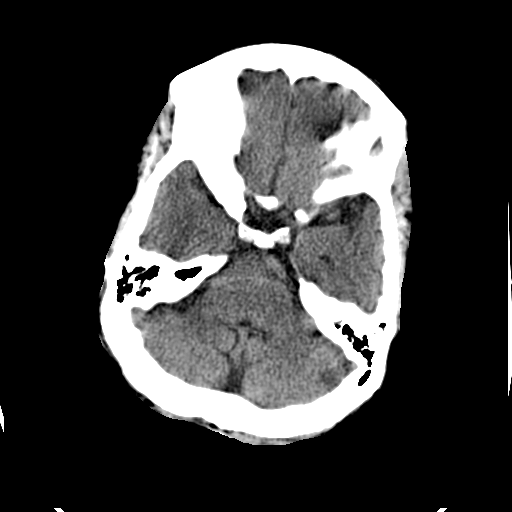
[im 9/36  brain]
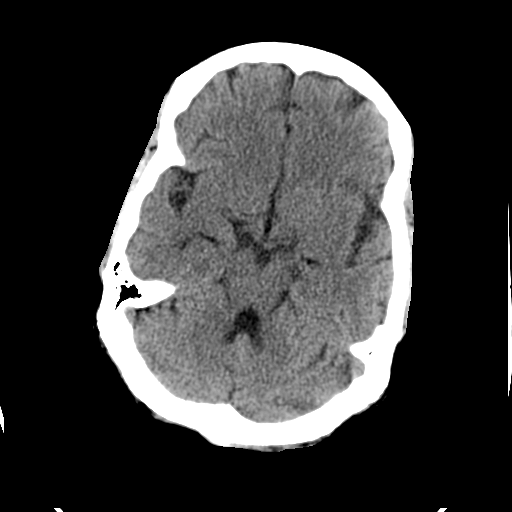
[im 10/36  brain]
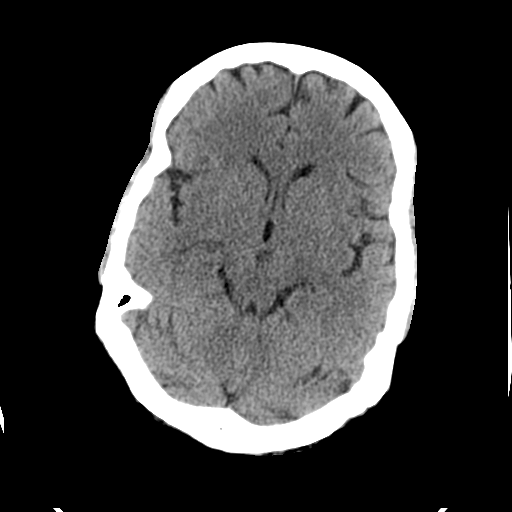
[im 10/36  bone]
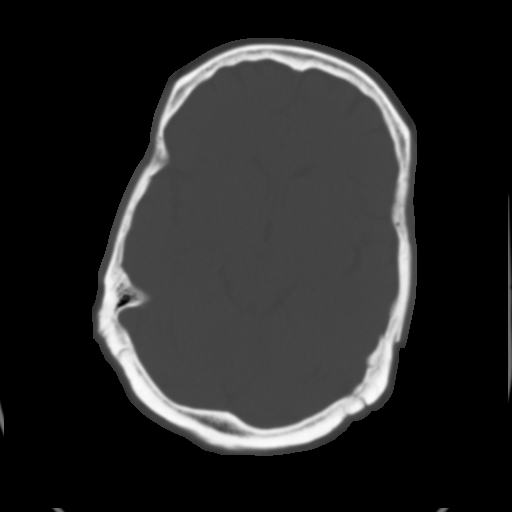
[im 13/36  brain]
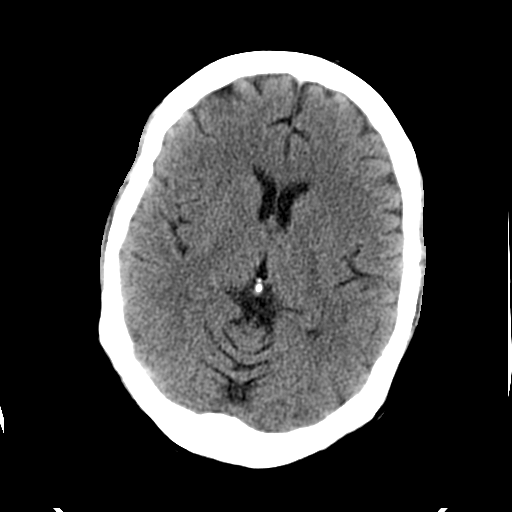
[im 15/36  brain]
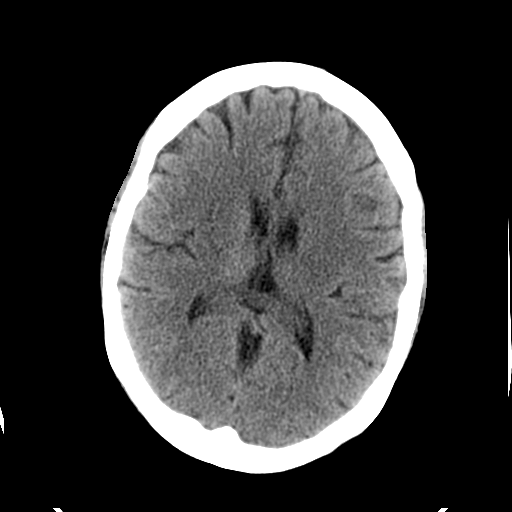
[im 17/36  brain]
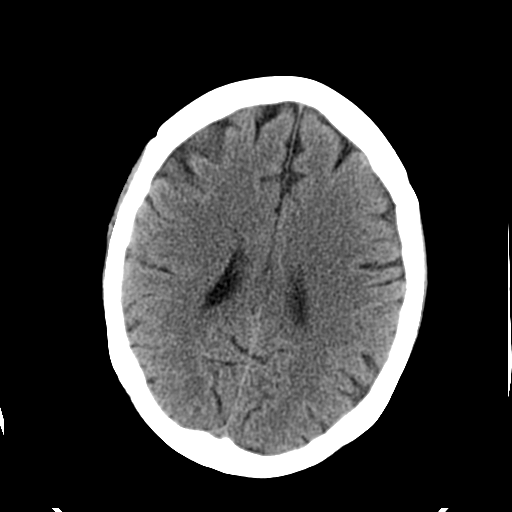
[im 19/36  brain]
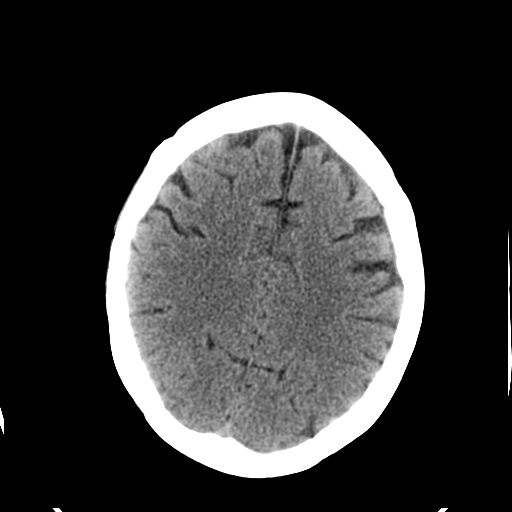
[im 19/36  bone]
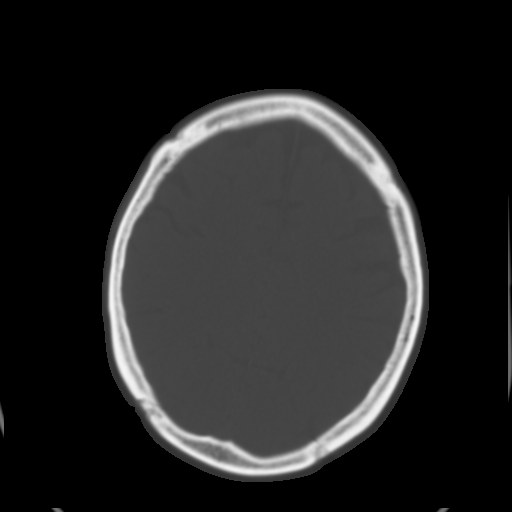
[im 21/36  brain]
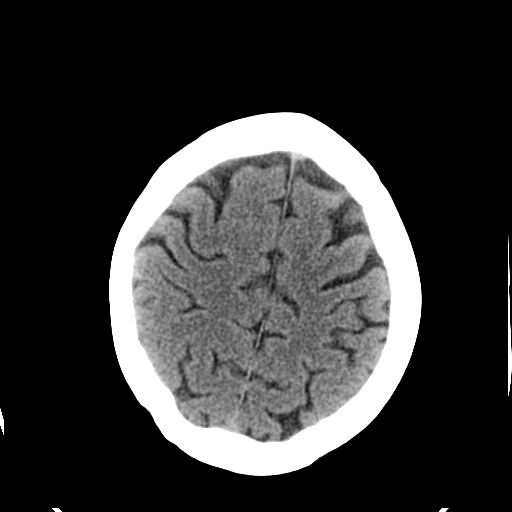
[im 23/36  brain]
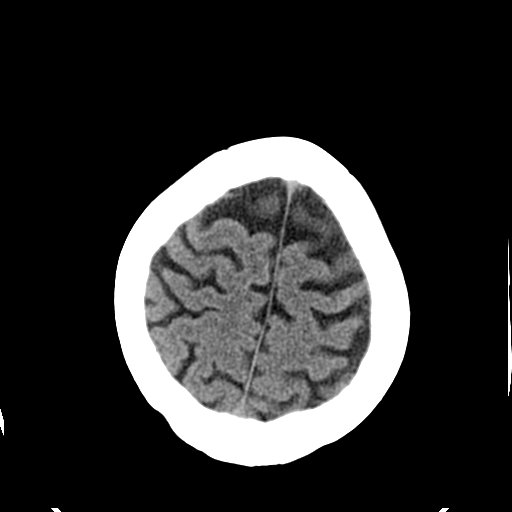
[im 26/36  brain]
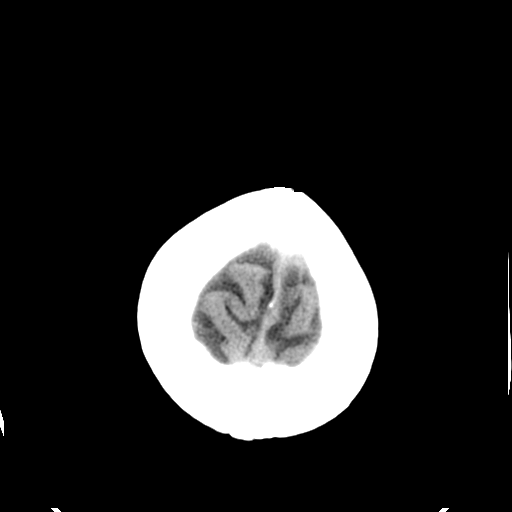
[im 27/36  brain]
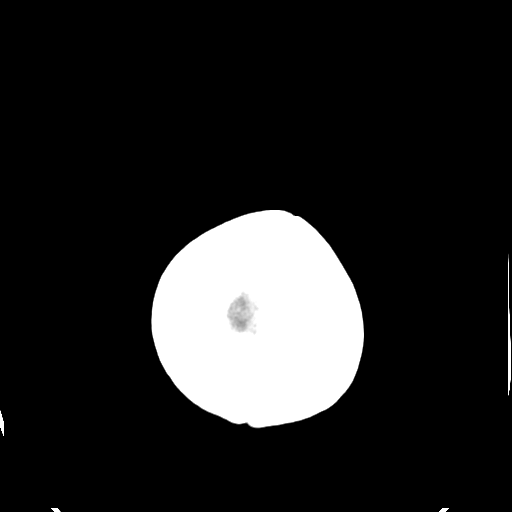
[im 27/36  bone]
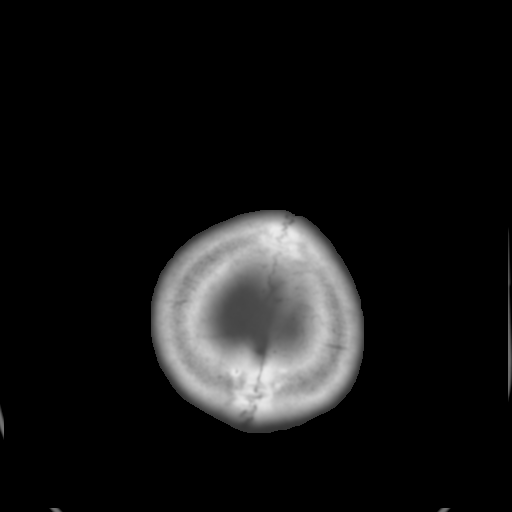
[im 29/36  brain]
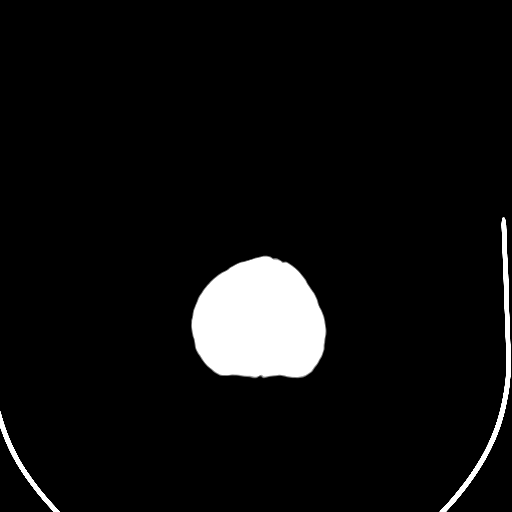
[im 32/36  brain]
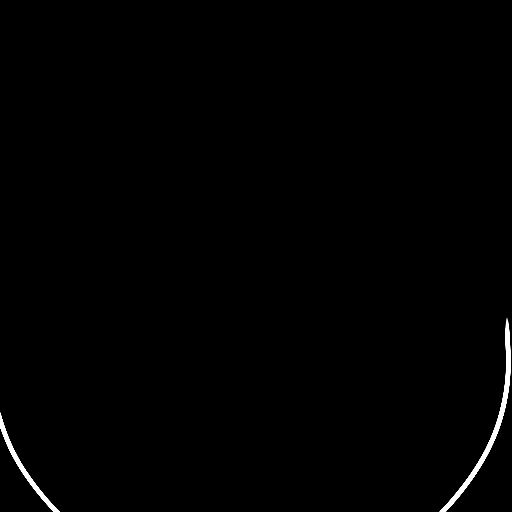
[im 34/36  brain]
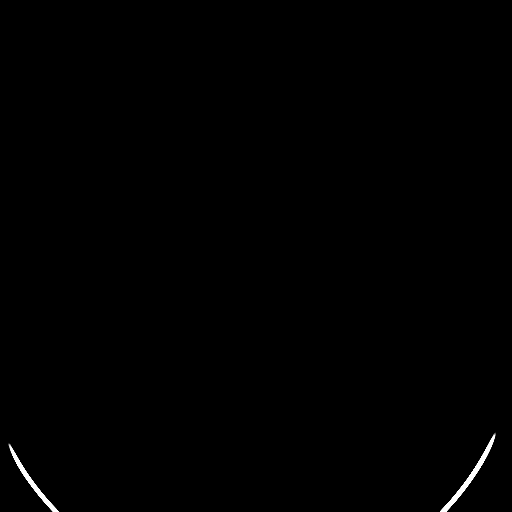

[16 of 30 positions shown; findings below may reference images not displayed]

FINDINGS: There is no evidence of mass effect, midline shift or extra-axial
fluid collections. There is no evidence of a space-occupying lesion
or intracranial hemorrhage. There is no evidence of a cortical-based
area of acute infarction.

The ventricles and sulci are appropriate for the patient's age. The
basal cisterns are patent.

Visualized portions of the orbits are unremarkable. The visualized
portions of the paranasal sinuses and mastoid air cells are
unremarkable.

The osseous structures are unremarkable.
IMPRESSION: No acute intracranial pathology.

## 2016-08-03 IMAGING — DX DG ABDOMEN ACUTE W/ 1V CHEST
3 series · 3 of 3 positions shown · non-contrast
Comparison: CT abdomen 03/16/2015

CLINICAL DATA: Syncope, intermittent chest pain

EXAM:
DG ABDOMEN ACUTE W/ 1V CHEST

[chest pa]
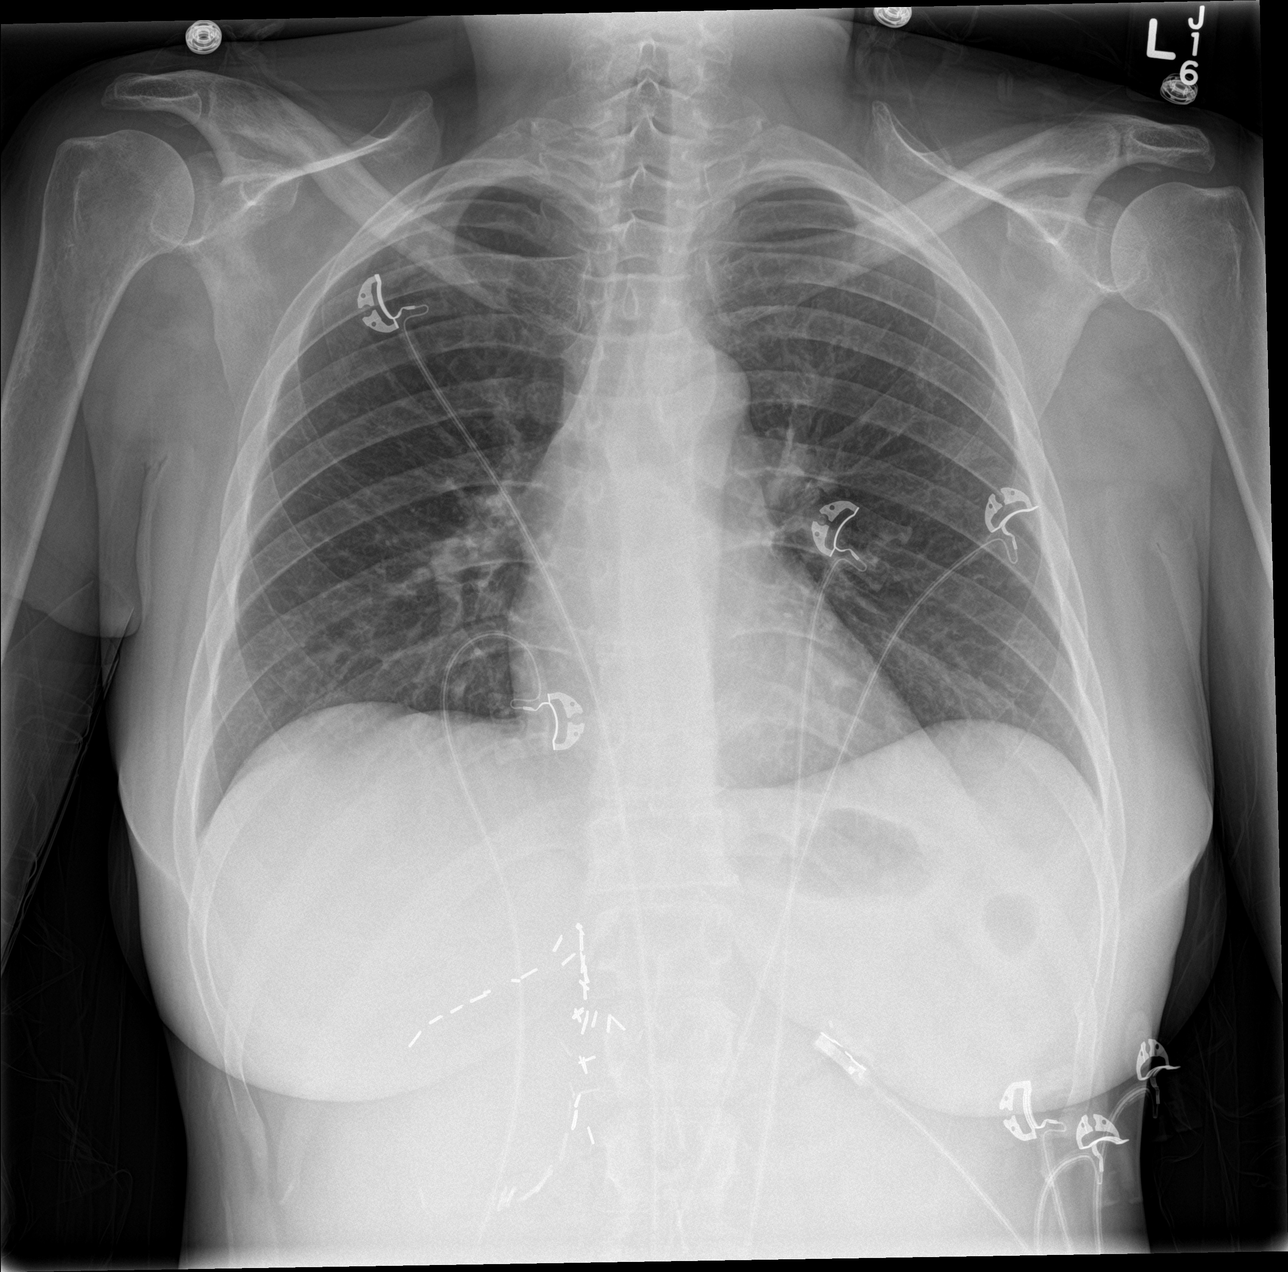

[abdomen erect]
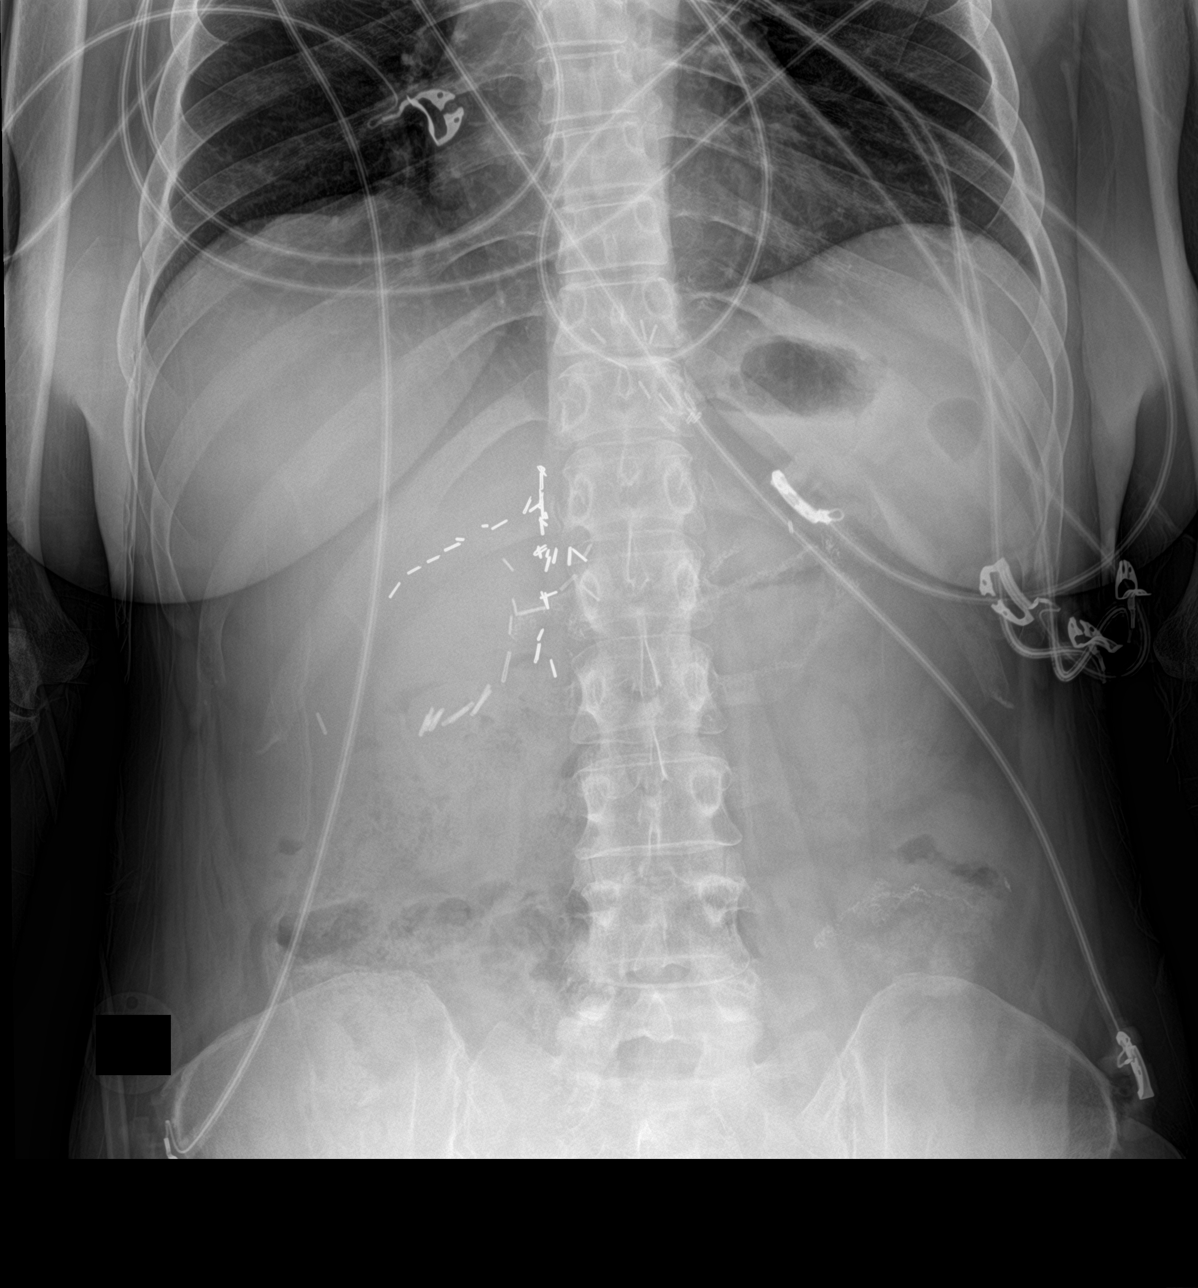

[abdomen supine]
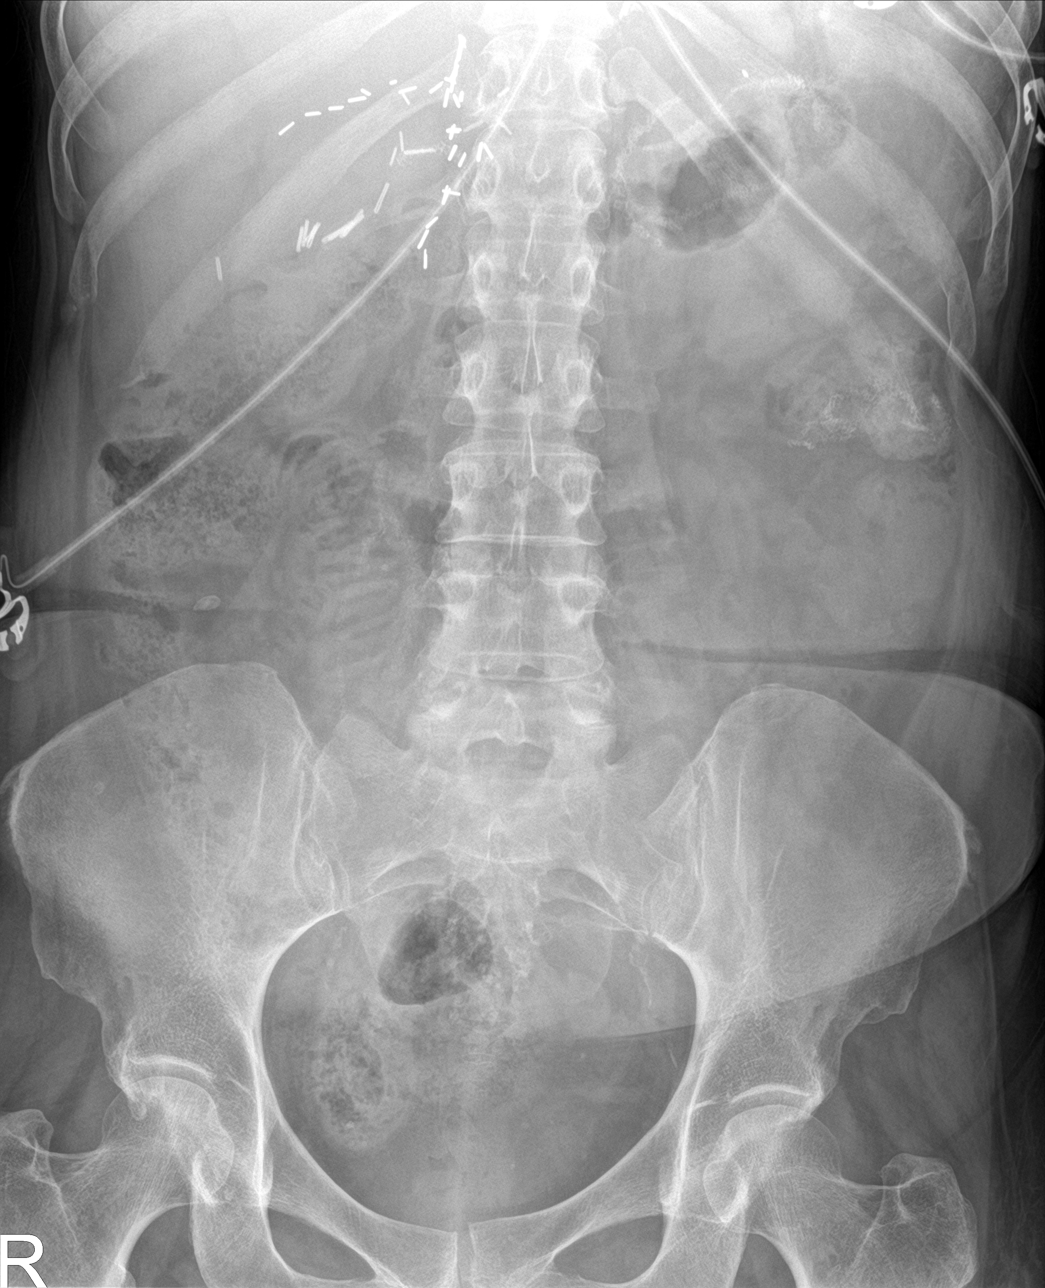

[3 of 3 positions shown; findings below may reference images not displayed]

FINDINGS: There is a moderate amount of stool in the colon. There is no
evidence of dilated bowel loops or free intraperitoneal air. No
radiopaque calculi or other significant radiographic abnormality is
seen. Heart size and mediastinal contours are within normal limits.
Both lungs are clear.
IMPRESSION: Negative abdominal radiographs.  No acute cardiopulmonary disease.

## 2016-08-08 ENCOUNTER — Other Ambulatory Visit: Payer: Self-pay

## 2016-08-08 ENCOUNTER — Ambulatory Visit (INDEPENDENT_AMBULATORY_CARE_PROVIDER_SITE_OTHER): Payer: Medicare HMO | Admitting: Gastroenterology

## 2016-08-08 ENCOUNTER — Encounter: Payer: Self-pay | Admitting: Gastroenterology

## 2016-08-08 VITALS — BP 96/73 | HR 94 | Temp 97.4°F | Ht 63.0 in | Wt 131.4 lb

## 2016-08-08 DIAGNOSIS — K625 Hemorrhage of anus and rectum: Secondary | ICD-10-CM | POA: Diagnosis not present

## 2016-08-08 DIAGNOSIS — R112 Nausea with vomiting, unspecified: Secondary | ICD-10-CM | POA: Diagnosis not present

## 2016-08-08 MED ORDER — PROMETHAZINE HCL 12.5 MG RE SUPP: 12.5000 mg | Freq: Four times a day (QID) | RECTAL | 2 refills | Status: DC | PRN

## 2016-08-08 MED ORDER — PEG 3350-KCL-NA BICARB-NACL 420 G PO SOLR: 4000.0000 mL | ORAL | 0 refills | Status: DC

## 2016-08-08 NOTE — Assessment & Plan Note (Signed)
History of Billroth II in remote past, gastroparesis, ETOH abuse. Likely multifactorial. Recent EGD on file during hospitalization. Proceed with UGI. Zofran without much improvement. Does well with Phenergan suppositories prn. Intolerant to Reglan. May need Marinol. UGI as planned first.

## 2016-08-08 NOTE — Progress Notes (Signed)
Referring Provider: Rosita Fire, MD Primary Care Physician:  Rosita Fire, MD Primary GI: Dr. Gala Romney   Chief Complaint  Patient presents with  . GI bleed    rectal bleeding, "burgandy, clots look like chicken liver"  . Anemia    HPI:   Angel Price is a 51 y.o. female presenting today with a complicated past medical history to include pancreatitis, ETOH abuse, prior BIllroth II, anemia of chronic disease, gastroparesis, GERD, chronic abdominal pain.  of GI bleed March 2018, here for hospital follow-up. EGD completed during hospitalization with red blood at anastomosis, s/p epi injection and clips. 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 Here to discuss possible colonoscopy as her last was in 2015 with inadequate prep.   Still with rectal bleeding. Notes it every other day. States it looks like "chicken livers" falling out. Will think she is having a BM but just looks like chicken livers. [place]. Notes abdominal cramping associated with this. Feels better once she passes it, then it will come back on.   Will eat but sometimes won't stay down. Some days will eat but then comes right back up. States food feels like it builds up then comes out. States "everything does this". Feels nauseated chronically. On Zofran without much help. Tries to eat small meals but just feels it coming back up. Drinks beer every day, about 3-4 beers or a 40. Takes Protonix twice a day. Was unable to tolerate Reglan due to "jerking and involuntary movements".   Lately has been tired and going to bed around 5pm.   Past Medical History:  Diagnosis Date  . Abscess    soft tissue  . Adrenal mass ([place])   . Alcohol abuse   . Anxiety   . Blood transfusion without reported diagnosis   . Chronic abdominal pain   . Chronic wound infection of abdomen   . Colon polyp    colonoscopy 04/2014  . Depression   . Diverticulosis    colonoscopy 04/2014 moderat pan colonic  . Gastritis    EGD 05/2014   . Gastroparesis Nov 2015  . GERD (gastroesophageal reflux disease)   . Hemorrhoid    internal large  . Hiatal hernia   . History of Billroth II operation   . Hypertension   . Lung nodule    CT 02/2014 needs repeat 1 month  . Lung nodule < 6cm on CT 04/25/2014  . Lupus   . Nausea and vomiting    chronic, recurrent  . Schatzki's ring    patent per EGD 04/2014  . Sickle cell trait ([place])   . Suicide attempt ([place])   . 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 ABDOMINAL HYSTERECTOMY  2013   [place]  . ABDOMINAL SURGERY    . ADRENALECTOMY Right   . AGILE CAPSULE N/A 01/05/2015   Procedure: AGILE CAPSULE;  Surgeon: Daneil Dolin, MD;  [facility];  Service: Endoscopy;  Laterality: N/A;  0700  . Billroth II procedure      [place], first 2000, 2005/2006.  Marland Kitchen BIOPSY  05/20/2013   Procedure: BIOPSIES OF ASCENDING AND SIGMOID COLON;  Surgeon: Daneil Dolin, MD;  Location: [facility];  Service: Endoscopy;;  . BIOPSY  04/26/2014   Procedure: BIOPSIES;  Surgeon: Danie Binder, MD;  Location: [facility];  Service: Endoscopy;;  . CHOLECYSTECTOMY    . COLONOSCOPY     in [place]  .  COLONOSCOPY WITH PROPOFOL N/A 05/20/2013   Dr.Rourk- inadequate prep, normal appearing rectum, grossly normal colon aside from pancolonic diverticula, normal terminal ileum bx= unremarkable colonic mucosa. Due for early interval 2016.   Marland Kitchen COLONOSCOPY WITH PROPOFOL N/A 04/26/2014   JIR:CVELFY ileum/one colon polyp removed/moderate pan-colonic diverticulosis/large internal hemorrhoids  . COLONOSCOPY WITH PROPOFOL N/A 12/23/2014   Dr.Rourk- minimal internal hemorrhoids, pancolonic diverticulosis  . DEBRIDEMENT OF ABDOMINAL WALL ABSCESS N/A 02/08/2013   Procedure: DEBRIDEMENT OF ABDOMINAL WALL ABSCESS;  Surgeon: Jamesetta So, MD;  Location: [facility];  Service: General;  Laterality: N/A;  . ESOPHAGOGASTRODUODENOSCOPY (EGD) WITH PROPOFOL N/A 05/20/2013    Dr.Rourk- s/p prior gastric surgery with normal esophagus, residual gastric mucosa and patent efferent limb  . ESOPHAGOGASTRODUODENOSCOPY (EGD) WITH PROPOFOL N/A 02/03/2014   Dr. Gala Romney:  s/p hemigastrectomy with retained gastric contents. Residual gastric mucosa and efferent limb appeared normal otherwise. Query gastroparesis.   Marland Kitchen ESOPHAGOGASTRODUODENOSCOPY (EGD) WITH PROPOFOL N/A 04/26/2014   BOF:BPZWCHEN'I ring/small HH/mild non-erosive gasrtitis/normal anastomosis  . ESOPHAGOGASTRODUODENOSCOPY (EGD) WITH PROPOFOL N/A 12/23/2014   Dr.Rourk- s/p prior hemigastrctomy, active oozing from anastomotic suture site, hemostasis achieved  . ESOPHAGOGASTRODUODENOSCOPY (EGD) WITH PROPOFOL N/A 05/23/2016   Dr. 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 tendon repar Right    wrist  . WOUND EXPLORATION Right 06/24/2013   Procedure: exploration of traumatic wound right wrist;  Surgeon: Tennis Must, MD;  Location: [facility];  Service: Orthopedics;  Laterality: Right;    Current Outpatient Prescriptions  Medication Sig Dispense Refill  . cyclobenzaprine (FLEXERIL) 10 MG tablet Take 1 tablet (10 mg total) by mouth 2 (two) times daily as needed for muscle spasms. 12 tablet 0  . folic acid (FOLVITE) 1 MG tablet Take 1 tablet (1 mg total) by mouth daily. 30 tablet 3  . ibuprofen (ADVIL,MOTRIN) 600 MG tablet Take 1 tablet (600 mg total) by mouth 4 (four) times daily. 30 tablet 0  . lisinopril (PRINIVIL,ZESTRIL) 10 MG tablet Take 10 mg by mouth daily.    . ondansetron (ZOFRAN) 4 MG tablet TAKE (1) TABLET BY MOUTH EVERY SIX HOURS. (Patient taking differently: Take 1 tablet every six hours as needed for nausea) 30 tablet 0  . ondansetron (ZOFRAN-ODT) 4 MG disintegrating tablet Take 1 tablet (4 mg total) by mouth every 8 (eight) hours as needed for nausea or vomiting. 8 tablet 0  . pantoprazole (PROTONIX) 40 MG tablet Take 1 tablet  (40 mg total) by mouth 2 (two) times daily before a meal. 60 tablet 0  . polyethylene glycol (MIRALAX) packet Take 17 g by mouth daily. (Patient taking differently: Take 17 g by mouth daily as needed for mild constipation. ) 30 each 0  . polyethylene glycol-electrolytes (TRILYTE) 420 g solution Take 4,000 mLs by mouth as directed. 4000 mL 0  . promethazine (PHENERGAN) 12.5 MG suppository Place 1 suppository (12.5 mg total) rectally every 6 (six) hours as needed for nausea or vomiting. 12 each 2   No current facility-administered medications for this visit.     Allergies as of 08/08/2016 - Review Complete 08/08/2016  Allergen Reaction Noted  . Codeine Hives 02/06/2013  . Morphine and related Hives 02/06/2013  . Reglan [metoclopramide] Other (See Comments) 08/08/2016    Family History  Problem Relation Age of Onset  . Brain cancer Son   . Schizophrenia Son   . Cancer Son        brain  .  Lung cancer Father   . Cancer Father        mets  . Drug abuse Mother   . Breast cancer Maternal Aunt   . Bipolar disorder Maternal Aunt   . Drug abuse Maternal Aunt   . Colon cancer Maternal Grandmother        late 47s, early 81s  . Drug abuse Sister   . Drug abuse Brother   . Bipolar disorder Paternal Grandfather   . Bipolar disorder Cousin   . Liver disease Neg Hx     Social History   Social History  . Marital status: Legally Separated    Spouse name: N/A  . Number of children: N/A  . Years of education: N/A   Social History Main Topics  . Smoking status: Current Every Day Smoker    Packs/day: 0.50    Years: 35.00    Types: Cigarettes  . Smokeless tobacco: Never Used  . Alcohol use 7.2 oz/week    12 Cans of beer per week     Comment: sometimes 3/ 12 oz beers a day  . Drug use: No     Comment: quit 09/2012  . Sexual activity: Yes    Birth control/ protection: Surgical   Other Topics Concern  . None   Social History Narrative  . None    Review of Systems: As mentioned  in HPI   Physical Exam: BP 96/73   Pulse 94   Temp 97.4 F (36.3 C) (Oral)   Ht 5\' 3"  (1.6 m)   Wt 131 lb 6.4 oz (59.6 kg)   BMI 23.28 kg/m  General:   Alert and oriented. No distress noted. Pleasant and cooperative.  Head:  Normocephalic and atraumatic. Eyes:  Conjuctiva clear without scleral icterus. Mouth:  Oral mucosa pink and moist. Good dentition. No lesions. Heart:  S1, S2 present without murmurs, rubs, or gallops. Lungs: mild expiratory wheeze bilaterally Abdomen:  +BS, soft, mild TTP upper abdomen and non-distended. No rebound or guarding. No HSM or masses noted. Msk:  Symmetrical without gross deformities. Normal posture. Extremities:  Without edema. Neurologic:  Alert and  oriented x4;  grossly normal neurologically. Psych:  Alert and cooperative. Normal mood and affect.

## 2016-08-08 NOTE — Assessment & Plan Note (Addendum)
51 year old female with intermittent rectal bleeding and last colonoscopy incomplete in 2015 due to inadequate prep. Overdue for early interval evaluation. Previously admitted for GI bleed March 2018 withred blood at anastomosis, s/p epi injection and clips. 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 Needs colonoscopy to further evaluate rectal bleeding. Endorses large amounts of beer daily. Discussed alcohol cessation.   Proceed with TCS with Dr. Gala Romney in near future: the risks, benefits, and alternatives have been discussed with the patient in detail. The patient states understanding and desires to proceed. PROPOFOL due to history of ETOH abuse ETOH cessation

## 2016-08-08 NOTE — Progress Notes (Signed)
cc'ed to pcp °

## 2016-08-08 NOTE — Patient Instructions (Signed)
We have scheduled you for a colonoscopy with Dr. Gala Romney in the near future.  I have ordered a special xray of your stomach and esophagus.   It is really important to stop drinking. This can worsen a lot of the symptoms you are having.

## 2016-08-14 ENCOUNTER — Ambulatory Visit (HOSPITAL_COMMUNITY): Admission: RE | Admit: 2016-08-14 | Payer: Medicare HMO | Source: Ambulatory Visit

## 2016-08-19 ENCOUNTER — Ambulatory Visit (HOSPITAL_COMMUNITY)
Admission: RE | Admit: 2016-08-19 | Discharge: 2016-08-19 | Disposition: A | Discharge status: Home / Self Care | Payer: Medicare HMO | Source: Ambulatory Visit | Attending: Gastroenterology | Admitting: Gastroenterology

## 2016-08-19 DIAGNOSIS — K648 Other hemorrhoids: Secondary | ICD-10-CM | POA: Diagnosis not present

## 2016-08-19 DIAGNOSIS — Z903 Acquired absence of stomach [part of]: Secondary | ICD-10-CM | POA: Diagnosis not present

## 2016-08-19 DIAGNOSIS — R112 Nausea with vomiting, unspecified: Secondary | ICD-10-CM | POA: Insufficient documentation

## 2016-08-19 DIAGNOSIS — K625 Hemorrhage of anus and rectum: Secondary | ICD-10-CM | POA: Diagnosis not present

## 2016-08-22 ENCOUNTER — Encounter (HOSPITAL_COMMUNITY): Payer: Self-pay

## 2016-08-22 ENCOUNTER — Inpatient Hospital Stay (HOSPITAL_COMMUNITY)
Admission: EM | Admit: 2016-08-22 | Discharge: 2016-08-25 | DRG: 394 | Disposition: A | Discharge status: Home / Self Care | Payer: Medicare HMO | Attending: Internal Medicine | Admitting: Internal Medicine

## 2016-08-22 ENCOUNTER — Emergency Department (HOSPITAL_COMMUNITY): Payer: Medicare HMO

## 2016-08-22 DIAGNOSIS — Z720 Tobacco use: Secondary | ICD-10-CM | POA: Diagnosis present

## 2016-08-22 DIAGNOSIS — I4581 Long QT syndrome: Secondary | ICD-10-CM | POA: Diagnosis not present

## 2016-08-22 DIAGNOSIS — E079 Disorder of thyroid, unspecified: Secondary | ICD-10-CM | POA: Diagnosis present

## 2016-08-22 DIAGNOSIS — F1010 Alcohol abuse, uncomplicated: Secondary | ICD-10-CM | POA: Diagnosis present

## 2016-08-22 DIAGNOSIS — R748 Abnormal levels of other serum enzymes: Secondary | ICD-10-CM | POA: Diagnosis present

## 2016-08-22 DIAGNOSIS — I959 Hypotension, unspecified: Secondary | ICD-10-CM | POA: Diagnosis present

## 2016-08-22 DIAGNOSIS — E0781 Sick-euthyroid syndrome: Secondary | ICD-10-CM | POA: Diagnosis present

## 2016-08-22 DIAGNOSIS — R58 Hemorrhage, not elsewhere classified: Secondary | ICD-10-CM

## 2016-08-22 DIAGNOSIS — K222 Esophageal obstruction: Secondary | ICD-10-CM

## 2016-08-22 DIAGNOSIS — Z8659 Personal history of other mental and behavioral disorders: Secondary | ICD-10-CM

## 2016-08-22 DIAGNOSIS — K625 Hemorrhage of anus and rectum: Secondary | ICD-10-CM | POA: Diagnosis not present

## 2016-08-22 DIAGNOSIS — K5730 Diverticulosis of large intestine without perforation or abscess without bleeding: Secondary | ICD-10-CM | POA: Diagnosis present

## 2016-08-22 DIAGNOSIS — K922 Gastrointestinal hemorrhage, unspecified: Secondary | ICD-10-CM | POA: Diagnosis not present

## 2016-08-22 DIAGNOSIS — R1013 Epigastric pain: Secondary | ICD-10-CM

## 2016-08-22 DIAGNOSIS — F259 Schizoaffective disorder, unspecified: Secondary | ICD-10-CM | POA: Diagnosis present

## 2016-08-22 DIAGNOSIS — Z79899 Other long term (current) drug therapy: Secondary | ICD-10-CM

## 2016-08-22 DIAGNOSIS — F332 Major depressive disorder, recurrent severe without psychotic features: Secondary | ICD-10-CM | POA: Diagnosis present

## 2016-08-22 DIAGNOSIS — R109 Unspecified abdominal pain: Secondary | ICD-10-CM | POA: Diagnosis present

## 2016-08-22 DIAGNOSIS — R69 Illness, unspecified: Secondary | ICD-10-CM | POA: Diagnosis not present

## 2016-08-22 DIAGNOSIS — Z888 Allergy status to other drugs, medicaments and biological substances status: Secondary | ICD-10-CM

## 2016-08-22 DIAGNOSIS — D573 Sickle-cell trait: Secondary | ICD-10-CM | POA: Diagnosis present

## 2016-08-22 DIAGNOSIS — Z818 Family history of other mental and behavioral disorders: Secondary | ICD-10-CM

## 2016-08-22 DIAGNOSIS — T39395A Adverse effect of other nonsteroidal anti-inflammatory drugs [NSAID], initial encounter: Secondary | ICD-10-CM | POA: Diagnosis present

## 2016-08-22 DIAGNOSIS — D62 Acute posthemorrhagic anemia: Secondary | ICD-10-CM | POA: Diagnosis present

## 2016-08-22 DIAGNOSIS — K219 Gastro-esophageal reflux disease without esophagitis: Secondary | ICD-10-CM | POA: Diagnosis present

## 2016-08-22 DIAGNOSIS — K648 Other hemorrhoids: Principal | ICD-10-CM | POA: Diagnosis present

## 2016-08-22 DIAGNOSIS — D649 Anemia, unspecified: Secondary | ICD-10-CM | POA: Diagnosis not present

## 2016-08-22 DIAGNOSIS — F419 Anxiety disorder, unspecified: Secondary | ICD-10-CM | POA: Diagnosis present

## 2016-08-22 DIAGNOSIS — N390 Urinary tract infection, site not specified: Secondary | ICD-10-CM | POA: Diagnosis present

## 2016-08-22 DIAGNOSIS — Q438 Other specified congenital malformations of intestine: Secondary | ICD-10-CM

## 2016-08-22 DIAGNOSIS — I10 Essential (primary) hypertension: Secondary | ICD-10-CM | POA: Diagnosis present

## 2016-08-22 DIAGNOSIS — G8929 Other chronic pain: Secondary | ICD-10-CM | POA: Diagnosis present

## 2016-08-22 DIAGNOSIS — I9589 Other hypotension: Secondary | ICD-10-CM | POA: Diagnosis not present

## 2016-08-22 DIAGNOSIS — E860 Dehydration: Secondary | ICD-10-CM | POA: Diagnosis not present

## 2016-08-22 DIAGNOSIS — Z86010 Personal history of colonic polyps: Secondary | ICD-10-CM

## 2016-08-22 DIAGNOSIS — D500 Iron deficiency anemia secondary to blood loss (chronic): Secondary | ICD-10-CM | POA: Diagnosis not present

## 2016-08-22 DIAGNOSIS — Z9884 Bariatric surgery status: Secondary | ICD-10-CM

## 2016-08-22 DIAGNOSIS — Z885 Allergy status to narcotic agent status: Secondary | ICD-10-CM

## 2016-08-22 DIAGNOSIS — K860 Alcohol-induced chronic pancreatitis: Secondary | ICD-10-CM | POA: Diagnosis present

## 2016-08-22 DIAGNOSIS — F17210 Nicotine dependence, cigarettes, uncomplicated: Secondary | ICD-10-CM | POA: Diagnosis present

## 2016-08-22 DIAGNOSIS — Z9049 Acquired absence of other specified parts of digestive tract: Secondary | ICD-10-CM

## 2016-08-22 DIAGNOSIS — K921 Melena: Secondary | ICD-10-CM | POA: Diagnosis not present

## 2016-08-22 DIAGNOSIS — F17200 Nicotine dependence, unspecified, uncomplicated: Secondary | ICD-10-CM | POA: Diagnosis present

## 2016-08-22 DIAGNOSIS — Z90710 Acquired absence of both cervix and uterus: Secondary | ICD-10-CM

## 2016-08-22 LAB — CBC
HCT: 30.6 % — ABNORMAL LOW (ref 36.0–46.0)
HCT: 29 % — ABNORMAL LOW (ref 36.0–46.0)
HEMOGLOBIN: 9.9 g/dL — AB (ref 12.0–15.0)
Hemoglobin: 10.5 g/dL — ABNORMAL LOW (ref 12.0–15.0)
MCH: 29.5 pg (ref 26.0–34.0)
MCH: 29.5 pg (ref 26.0–34.0)
MCHC: 34.3 g/dL (ref 30.0–36.0)
MCHC: 34.1 g/dL (ref 30.0–36.0)
MCV: 86 fL (ref 78.0–100.0)
MCV: 86.3 fL (ref 78.0–100.0)
PLATELETS: 192 10*3/uL (ref 150–400)
Platelets: 179 10*3/uL (ref 150–400)
RBC: 3.56 MIL/uL — ABNORMAL LOW (ref 3.87–5.11)
RBC: 3.36 MIL/uL — ABNORMAL LOW (ref 3.87–5.11)
RDW: 16.7 % — AB (ref 11.5–15.5)
RDW: 16.7 % — ABNORMAL HIGH (ref 11.5–15.5)
WBC: 5 10*3/uL (ref 4.0–10.5)
WBC: 4.7 10*3/uL (ref 4.0–10.5)

## 2016-08-22 LAB — TYPE AND SCREEN
ABO/RH(D): O POS
Antibody Screen: NEGATIVE

## 2016-08-22 LAB — ETHANOL: Alcohol, Ethyl (B): 22 mg/dL — ABNORMAL HIGH (ref <5)

## 2016-08-22 LAB — URINALYSIS, ROUTINE W REFLEX MICROSCOPIC
BILIRUBIN URINE: NEGATIVE
Glucose, UA: NEGATIVE mg/dL
KETONES UR: 20 mg/dL — AB
Nitrite: NEGATIVE
PH: 5 (ref 5.0–8.0)
PROTEIN: NEGATIVE mg/dL
Specific Gravity, Urine: 1.01 (ref 1.005–1.030)

## 2016-08-22 LAB — PROTIME-INR
INR: 0.98
PROTHROMBIN TIME: 13 s (ref 11.4–15.2)

## 2016-08-22 LAB — COMPREHENSIVE METABOLIC PANEL
ALK PHOS: 130 U/L — AB (ref 38–126)
ALT: 56 U/L — ABNORMAL HIGH (ref 14–54)
AST: 172 U/L — AB (ref 15–41)
Albumin: 3.2 g/dL — ABNORMAL LOW (ref 3.5–5.0)
Anion gap: 13 (ref 5–15)
BILIRUBIN TOTAL: 1 mg/dL (ref 0.3–1.2)
BUN: <5 mg/dL — ABNORMAL LOW (ref 6–20)
CALCIUM: 8.7 mg/dL — AB (ref 8.9–10.3)
CO2: 20 mmol/L — ABNORMAL LOW (ref 22–32)
CREATININE: 0.45 mg/dL (ref 0.44–1.00)
Chloride: 97 mmol/L — ABNORMAL LOW (ref 101–111)
GFR calc Af Amer: >60 mL/min (ref >60)
Glucose, Bld: 70 mg/dL (ref 65–99)
Potassium: 3.9 mmol/L (ref 3.5–5.1)
Sodium: 130 mmol/L — ABNORMAL LOW (ref 135–145)
TOTAL PROTEIN: 6.9 g/dL (ref 6.5–8.1)

## 2016-08-22 LAB — MRSA PCR SCREENING: MRSA by PCR: NEGATIVE

## 2016-08-22 LAB — TSH: TSH: 5.378 u[IU]/mL — ABNORMAL HIGH (ref 0.350–4.500)

## 2016-08-22 LAB — LIPASE, BLOOD: Lipase: 22 U/L (ref 11–51)

## 2016-08-22 MED ORDER — PEG 3350-KCL-NA BICARB-NACL 420 G PO SOLR
2000.0000 mL | Freq: Once | ORAL | Status: AC
  Administered 2016-08-23: 2000 mL via ORAL
  Filled 2016-08-22: qty 4000

## 2016-08-22 MED ORDER — SODIUM CHLORIDE 0.9 % IV SOLN
1000.0000 mL | INTRAVENOUS | Status: DC
Start: 2016-08-22 — End: 2016-08-24
  Administered 2016-08-22 – 2016-08-24 (×2): 1000 mL via INTRAVENOUS

## 2016-08-22 MED ORDER — PROMETHAZINE HCL 25 MG/ML IJ SOLN
12.5000 mg | Freq: Once | INTRAMUSCULAR | Status: AC
Start: 2016-08-22 — End: 2016-08-22
  Administered 2016-08-22: 12.5 mg via INTRAMUSCULAR
  Filled 2016-08-22: qty 1

## 2016-08-22 MED ORDER — SODIUM CHLORIDE 0.9 % IV BOLUS (SEPSIS)
500.0000 mL | Freq: Once | INTRAVENOUS | Status: AC
  Administered 2016-08-22: 500 mL via INTRAVENOUS

## 2016-08-22 MED ORDER — SODIUM CHLORIDE 0.9% FLUSH
3.0000 mL | Freq: Two times a day (BID) | INTRAVENOUS | Status: DC
  Administered 2016-08-22 – 2016-08-25 (×5): 3 mL via INTRAVENOUS

## 2016-08-22 MED ORDER — ADULT MULTIVITAMIN W/MINERALS CH
1.0000 | ORAL_TABLET | Freq: Every day | ORAL | Status: DC
  Administered 2016-08-22 – 2016-08-25 (×3): 1 via ORAL
  Filled 2016-08-22 (×3): qty 1

## 2016-08-22 MED ORDER — CYCLOBENZAPRINE HCL 10 MG PO TABS: 10.0000 mg | ORAL_TABLET | Freq: Two times a day (BID) | ORAL | Status: DC | PRN

## 2016-08-22 MED ORDER — ONDANSETRON HCL 4 MG/2ML IJ SOLN
4.0000 mg | Freq: Four times a day (QID) | INTRAMUSCULAR | Status: DC | PRN
  Administered 2016-08-22 – 2016-08-25 (×9): 4 mg via INTRAVENOUS
  Filled 2016-08-22 (×9): qty 2

## 2016-08-22 MED ORDER — LORAZEPAM 1 MG PO TABS: 1.0000 mg | ORAL_TABLET | Freq: Four times a day (QID) | ORAL | Status: DC | PRN

## 2016-08-22 MED ORDER — HYDROMORPHONE HCL 1 MG/ML IJ SOLN
1.0000 mg | INTRAMUSCULAR | Status: DC | PRN
  Administered 2016-08-22: 0.5 mg via INTRAVENOUS
  Administered 2016-08-22 – 2016-08-25 (×14): 1 mg via INTRAVENOUS
  Filled 2016-08-22 (×15): qty 1

## 2016-08-22 MED ORDER — DIPHENHYDRAMINE HCL 50 MG/ML IJ SOLN
25.0000 mg | Freq: Four times a day (QID) | INTRAMUSCULAR | Status: DC | PRN
  Administered 2016-08-22 – 2016-08-24 (×4): 25 mg via INTRAVENOUS
  Filled 2016-08-22 (×4): qty 1

## 2016-08-22 MED ORDER — LORAZEPAM 2 MG/ML IJ SOLN
1.0000 mg | Freq: Four times a day (QID) | INTRAMUSCULAR | Status: DC | PRN
  Administered 2016-08-23: 1 mg via INTRAVENOUS
  Filled 2016-08-22: qty 1

## 2016-08-22 MED ORDER — HYDROMORPHONE HCL 1 MG/ML IJ SOLN
1.0000 mg | Freq: Once | INTRAMUSCULAR | Status: AC
  Administered 2016-08-22: 1 mg via INTRAVENOUS
  Filled 2016-08-22: qty 1

## 2016-08-22 MED ORDER — NICOTINE 14 MG/24HR TD PT24
14.0000 mg | MEDICATED_PATCH | Freq: Every day | TRANSDERMAL | Status: DC
  Administered 2016-08-22 – 2016-08-25 (×3): 14 mg via TRANSDERMAL
  Filled 2016-08-22 (×4): qty 1

## 2016-08-22 MED ORDER — PEG 3350-KCL-NA BICARB-NACL 420 G PO SOLR
2000.0000 mL | Freq: Once | ORAL | Status: AC
Start: 2016-08-22 — End: 2016-08-22
  Administered 2016-08-22: 2000 mL via ORAL
  Filled 2016-08-22: qty 4000

## 2016-08-22 MED ORDER — ONDANSETRON HCL 4 MG PO TABS: 4.0000 mg | ORAL_TABLET | Freq: Four times a day (QID) | ORAL | Status: DC | PRN

## 2016-08-22 MED ORDER — ONDANSETRON HCL 4 MG/2ML IJ SOLN
4.0000 mg | Freq: Once | INTRAMUSCULAR | Status: AC
  Administered 2016-08-22: 4 mg via INTRAVENOUS
  Filled 2016-08-22: qty 2

## 2016-08-22 MED ORDER — PANTOPRAZOLE SODIUM 40 MG IV SOLR
40.0000 mg | Freq: Two times a day (BID) | INTRAVENOUS | Status: DC
  Administered 2016-08-22: 40 mg via INTRAVENOUS
  Filled 2016-08-22: qty 40

## 2016-08-22 MED ORDER — SODIUM CHLORIDE 0.9 % IV SOLN
INTRAVENOUS | Status: DC
  Administered 2016-08-22: 16:00:00 via INTRAVENOUS

## 2016-08-22 MED ORDER — FOLIC ACID 1 MG PO TABS
1.0000 mg | ORAL_TABLET | Freq: Every day | ORAL | Status: DC
  Administered 2016-08-22 – 2016-08-25 (×3): 1 mg via ORAL
  Filled 2016-08-22 (×3): qty 1

## 2016-08-22 MED ORDER — THIAMINE HCL 100 MG/ML IJ SOLN
100.0000 mg | Freq: Every day | INTRAMUSCULAR | Status: DC
  Filled 2016-08-22: qty 2

## 2016-08-22 MED ORDER — VITAMIN B-1 100 MG PO TABS
100.0000 mg | ORAL_TABLET | Freq: Every day | ORAL | Status: DC
  Administered 2016-08-22 – 2016-08-25 (×3): 100 mg via ORAL
  Filled 2016-08-22 (×3): qty 1

## 2016-08-22 NOTE — H&P (Signed)
History and Physical    Angel Price GMW:[id number] DOB: 03/04/1966 DOA: 08/22/2016  PCP: Rosita Fire, MD  Patient coming from: Home.    Chief Complaint: Rectal bleeding.   HPI: Angel Price is an 51 y.o. female with multiple medical problems including depression, anxiety, hx of alcohol abuse, s/p Billroth II,  Hypothryoidism, prior suicidal ideation, prior bleeding at anstimotic site, Lupus, hx of pulmonary nodules, HTN on meds, hx of gastroparesis, with plan to get colonoscopy with Dr Oneida Alar, presented to the ER with rectal bleeding for several days.  She had hx of abdominal wall abscesses, and has chronic abdominal pain, with some discharge. Evaluation in the ER showed stable hemodynamics, Hb of 10 g per dL, and soft BP of 44'I systolic.  EDP consulted Dr Sydell Axon, and hospitalist was asked to admit her for further evaluation.   ED Course:  See above.  Rewiew of Systems:  Constitutional: Negative for malaise, fever and chills. No significant weight loss or weight gain Eyes: Negative for eye pain, redness and discharge, diplopia, visual changes, or flashes of light. ENMT: Negative for ear pain, hoarseness, nasal congestion, sinus pressure and sore throat. No headaches; tinnitus, drooling, or problem swallowing. Cardiovascular: Negative for chest pain, palpitations, diaphoresis, dyspnea and peripheral edema. ; No orthopnea, PND Respiratory: Negative for cough, hemoptysis, wheezing and stridor. No pleuritic chestpain. Gastrointestinal: Negative for diarrhea, constipation,  melena, hematemesis, jaundice    Genitourinary: Negative for frequency, dysuria, incontinence,flank pain and hematuria; Musculoskeletal: Negative for back pain and neck pain. Negative for swelling and trauma.;  Skin: . Negative for pruritus, rash, abrasions, bruising and skin lesion.; ulcerations Neuro: Negative for headache, and neck stiffness. Negative for weakness, altered level of consciousness , altered mental  status, extremity weakness, burning feet, involuntary movement, seizure and syncope.  Psych: negative for anxiety, depression, insomnia, tearfulness, panic attacks, hallucinations, paranoia, suicidal or homicidal ideation    Past Medical History:  Diagnosis Date  . Abscess    soft tissue  . Adrenal mass ([place])   . Alcohol abuse   . Anxiety   . Blood transfusion without reported diagnosis   . Chronic abdominal pain   . Chronic wound infection of abdomen   . Colon polyp    colonoscopy 04/2014  . Depression   . Diverticulosis    colonoscopy 04/2014 moderat pan colonic  . Gastritis    EGD 05/2014  . Gastroparesis Nov 2015  . GERD (gastroesophageal reflux disease)   . Hemorrhoid    internal large  . Hiatal hernia   . History of Billroth II operation   . Hypertension   . Lung nodule    CT 02/2014 needs repeat 1 month  . Lung nodule < 6cm on CT 04/25/2014  . Lupus   . Nausea and vomiting    chronic, recurrent  . Schatzki's ring    patent per EGD 04/2014  . Sickle cell trait ([place])   . Suicide attempt ([place])   . 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 ABDOMINAL HYSTERECTOMY  2013   [place]  . ABDOMINAL SURGERY    . ADRENALECTOMY Right   . AGILE CAPSULE N/A 01/05/2015   Procedure: AGILE CAPSULE;  Surgeon: Daneil Dolin, MD;  [facility];  Service: Endoscopy;  Laterality: N/A;  0700  . Billroth II procedure      [place], first 2000, 2005/2006.  Marland Kitchen BIOPSY  05/20/2013   Procedure: BIOPSIES OF ASCENDING  AND SIGMOID COLON;  Surgeon: Daneil Dolin, MD;  [facility];  Service: Endoscopy;;  . BIOPSY  04/26/2014   Procedure: BIOPSIES;  Surgeon: Danie Binder, MD;  Location: [facility];  Service: Endoscopy;;  . CHOLECYSTECTOMY    . COLONOSCOPY     in [place]  . COLONOSCOPY WITH PROPOFOL N/A 05/20/2013   Dr.Rourk- inadequate prep, normal appearing rectum, grossly normal colon aside from pancolonic diverticula,  normal terminal ileum bx= unremarkable colonic mucosa. Due for early interval 2016.   Marland Kitchen COLONOSCOPY WITH PROPOFOL N/A 04/26/2014   RXV:QMGQQP ileum/one colon polyp removed/moderate pan-colonic diverticulosis/large internal hemorrhoids  . COLONOSCOPY WITH PROPOFOL N/A 12/23/2014   Dr.Rourk- minimal internal hemorrhoids, pancolonic diverticulosis  . DEBRIDEMENT OF ABDOMINAL WALL ABSCESS N/A 02/08/2013   Procedure: DEBRIDEMENT OF ABDOMINAL WALL ABSCESS;  Surgeon: Jamesetta So, MD;  Location[facility];  Service: General;  Laterality: N/A;  . ESOPHAGOGASTRODUODENOSCOPY (EGD) WITH PROPOFOL N/A 05/20/2013   Dr.Rourk- s/p prior gastric surgery with normal esophagus, residual gastric mucosa and patent efferent limb  . ESOPHAGOGASTRODUODENOSCOPY (EGD) WITH PROPOFOL N/A 02/03/2014   Dr. Gala Romney:  s/p hemigastrectomy with retained gastric contents. Residual gastric mucosa and efferent limb appeared normal otherwise. Query gastroparesis.   Marland Kitchen ESOPHAGOGASTRODUODENOSCOPY (EGD) WITH PROPOFOL N/A 04/26/2014   YPP:JKDTOIZT'I ring/small HH/mild non-erosive gasrtitis/normal anastomosis  . ESOPHAGOGASTRODUODENOSCOPY (EGD) WITH PROPOFOL N/A 12/23/2014   Dr.Rourk- s/p prior hemigastrctomy, active oozing from anastomotic suture site, hemostasis achieved  . ESOPHAGOGASTRODUODENOSCOPY (EGD) WITH PROPOFOL N/A 05/23/2016   Dr. 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 tendon repar Right    wrist  . WOUND EXPLORATION Right 06/24/2013   Procedure: exploration of traumatic wound right wrist;  Surgeon: Tennis Must, MD;  Location: [facility];  Service: Orthopedics;  Laterality: Right;     reports that she has been smoking Cigarettes.  She has a 17.50 pack-year smoking history. She has never used smokeless tobacco. She reports that she drinks about 7.2 oz of alcohol per week . She reports that she does not use drugs.  Allergies  Allergen Reactions    . Codeine Hives  . Morphine And Related Hives  . Reglan [Metoclopramide] Other (See Comments)    EPS symptoms     Family History  Problem Relation Age of Onset  . Brain cancer Son   . Schizophrenia Son   . Cancer Son        brain  . Lung cancer Father   . Cancer Father        mets  . Drug abuse Mother   . Breast cancer Maternal Aunt   . Bipolar disorder Maternal Aunt   . Drug abuse Maternal Aunt   . Colon cancer Maternal Grandmother        late 41s, early 6s  . Drug abuse Sister   . Drug abuse Brother   . Bipolar disorder Paternal Grandfather   . Bipolar disorder Cousin   . Liver disease Neg Hx      Prior to Admission medications   Medication Sig Start Date End Date Taking? Authorizing Provider  cyclobenzaprine (FLEXERIL) 10 MG tablet Take 1 tablet (10 mg total) by mouth 2 (two) times daily as needed for muscle spasms. 08/01/16   Lily Kocher, PA-C  folic acid (FOLVITE) 1 MG tablet Take 1 tablet (1 mg total) by mouth daily. 10/08/15   Rosita Fire, MD  ibuprofen (ADVIL,MOTRIN) 600 MG tablet Take  1 tablet (600 mg total) by mouth 4 (four) times daily. 08/01/16   Lily Kocher, PA-C  lisinopril (PRINIVIL,ZESTRIL) 10 MG tablet Take 10 mg by mouth daily.    [provider]  ondansetron (ZOFRAN) 4 MG tablet TAKE (1) TABLET BY MOUTH EVERY SIX HOURS. Patient taking differently: Take 1 tablet every six hours as needed for nausea 05/17/16   Mahala Menghini, PA-C  ondansetron (ZOFRAN-ODT) 4 MG disintegrating tablet Take 1 tablet (4 mg total) by mouth every 8 (eight) hours as needed for nausea or vomiting. 07/14/16   Davonna Belling, MD  pantoprazole (PROTONIX) 40 MG tablet Take 1 tablet (40 mg total) by mouth 2 (two) times daily before a meal. 09/04/14   Daleen Bo, MD  polyethylene glycol [facility]) packet Take 17 g by mouth daily. Patient taking differently: Take 17 g by mouth daily as needed for mild constipation.  04/25/16   Evalee Jefferson, PA-C  polyethylene  glycol-electrolytes (TRILYTE) 420 g solution Take 4,000 mLs by mouth as directed. 08/08/16   Rourk, Cristopher Estimable, MD  promethazine (PHENERGAN) 12.5 MG suppository Place 1 suppository (12.5 mg total) rectally every 6 (six) hours as needed for nausea or vomiting. 08/08/16   Annitta Needs, NP    Physical Exam: Vitals:   08/22/16 0900 08/22/16 0930 08/22/16 1000 08/22/16 1040  BP: 112/78 111/77 (!) 130/96 97/71  Pulse: 94 82 93 93  Resp: (!) 25 20 (!) 30 13  Temp:      TempSrc:      SpO2: 96% 96% 97% 98%  Weight:      Height:          Constitutional: NAD, calm, comfortable Vitals:   08/22/16 0900 08/22/16 0930 08/22/16 1000 08/22/16 1040  BP: 112/78 111/77 (!) 130/96 97/71  Pulse: 94 82 93 93  Resp: (!) 25 20 (!) 30 13  Temp:      TempSrc:      SpO2: 96% 96% 97% 98%  Weight:      Height:       Eyes: PERRL, lids and conjunctivae normal ENMT: Mucous membranes are moist. Posterior pharynx clear of any exudate or lesions.Normal dentition.  Neck: normal, supple, no masses, no thyromegaly Respiratory: clear to auscultation bilaterally, no wheezing, no crackles. Normal respiratory effort. No accessory muscle use.  Cardiovascular: Regular rate and rhythm, no murmurs / rubs / gallops. No extremity edema. 2+ pedal pulses. No carotid bruits.  Abdomen: some tenderness on the left side, no masses palpated. No hepatosplenomegaly. Bowel sounds positive.  Musculoskeletal: no clubbing / cyanosis. No joint deformity upper and lower extremities. Good ROM, no contractures. Normal muscle tone.  Skin: no rashes, lesions, ulcers. No induration Neurologic: CN 2-12 grossly intact. Sensation intact, DTR normal. Strength 5/5 in all 4.  Psychiatric: Normal judgment and insight. Alert and oriented x 3. Normal mood.   Labs on Admission: I have personally reviewed following labs and imaging studies CBC:  Recent Labs Lab 08/22/16 0915  WBC 5.0  HGB 10.5*  HCT 30.6*  MCV 86.0  PLT 742   Basic Metabolic  Panel:  Recent Labs Lab 08/22/16 0915  NA 130*  K 3.9  CL 97*  CO2 20*  GLUCOSE 70  BUN <5*  CREATININE 0.45  CALCIUM 8.7*   GFR: Estimated Creatinine Clearance: 71.1 mL/min (by C-G formula based on SCr of 0.45 mg/dL). Liver Function Tests:  Recent Labs Lab 08/22/16 0915  AST 172*  ALT 56*  ALKPHOS 130*  BILITOT 1.0  PROT 6.9  ALBUMIN 3.2*    Recent Labs Lab 08/22/16 0916  LIPASE 22   No results for input(s): AMMONIA in the last 168 hours. Coagulation Profile:  Recent Labs Lab 08/22/16 0915  INR 0.98   Urine analysis:    Component Value Date/Time   COLORURINE YELLOW 04/25/2016 1510   APPEARANCEUR HAZY (A) 04/25/2016 1510   LABSPEC 1.014 04/25/2016 1510   PHURINE 5.0 04/25/2016 1510   GLUCOSEU NEGATIVE 04/25/2016 1510   HGBUR NEGATIVE 04/25/2016 1510   BILIRUBINUR NEGATIVE 04/25/2016 1510   KETONESUR NEGATIVE 04/25/2016 1510   PROTEINUR NEGATIVE 04/25/2016 1510   UROBILINOGEN 0.2 11/26/2014 0915   NITRITE NEGATIVE 04/25/2016 1510   LEUKOCYTESUR LARGE (A) 04/25/2016 1510    Radiological Exams on Admission: Dg Abdomen Acute W/chest  Result Date: 08/22/2016 CLINICAL DATA:  Recent rectal bleeding EXAM: DG ABDOMEN ACUTE W/ 1V CHEST COMPARISON:  Chest radiograph May 22, 2016; CT abdomen and pelvis May 22, 2016 FINDINGS: PA chest: Lungs are clear. Heart size and pulmonary vascularity are normal. No adenopathy. Supine and upright abdomen: There is moderate contrast in the colon. There is no bowel dilatation or air-fluid level to suggest bowel obstruction. No free air. There are multiple surgical clips in the upper abdomen. IMPRESSION: No bowel dilatation or air-fluid level to suggest bowel obstruction. No free air. Moderate contrast is seen in the colon. Extensive postoperative change present in the abdomen. Lungs clear. Electronically Signed   By: Lowella Grip III M.D.   On: 08/22/2016 08:56    EKG: Independently reviewed.   Assessment/Plan Principal  Problem:   Bright red rectal bleeding Active Problems:   History of schizoaffective disorder   Tobacco abuse   History of gastric bypass   Essential hypertension, benign   Rectal bleeding   MDD (major depressive disorder), recurrent episode, severe (HCC)   Abdominal pain   Schatzki's ring   Thyroid disease   PLAN:   Rectal bleeding:  Will admit her into the SDU as her BP is soft.  GI has been consulted.  Will need colonoscopy.  Hold BP meds.  Clear liquid until seen by GI.  IV protonix BID reasonable.  Serial H and H every 8 hours.  HTN:  Hold all anti HTN meds given soft BP in the setting of GI bleed.  Tobacco abuse:  Advised stop.  Alcohol abuse:  Will start PRN CIWA with oral ativan.   Psychiatric illness:  Stable.   DVT prophylaxis: SCD. Code Status: FULL CODE.  Family Communication: mother and friend at bedside.  Disposition Plan: to home.  Consults called: GI, Dr Sydell Axon.  Admission status: inpatient.    Paulette Rockford MD FACP. [facility]  If 7PM-7AM, please contact night-coverage [email or web address] Password [email or web address]  08/22/2016, 12:14 PM

## 2016-08-22 NOTE — ED Notes (Signed)
Unsuccessful getting IV after 2 attempts. Will attempt with different RN

## 2016-08-22 NOTE — ED Triage Notes (Signed)
Pt reports multiple bloody stools over the past several days, states she sees a GI doctor but cannot say what diagnosis she has been given for this recurrent problem.

## 2016-08-22 NOTE — ED Notes (Addendum)
Called Lab to come draw labs on pt. Pt currently getting xrays performed

## 2016-08-22 NOTE — ED Provider Notes (Signed)
[facility] DEPT Provider Note   CSN: [id number] Arrival date & time: 08/22/16  0612     History   Chief Complaint Chief Complaint  Patient presents with  . Rectal Bleeding    HPI Angel Price is a 51 y.o. female with a long h/o abd problems presenting with 5 day h/o abd pain, n/v, and rectal bleeding   PMH significant for gastric bypass, abd abscess, chronic abd wound infection, upper GI bleed, alcohol abuse, diverticulosis, lesion of duodenum, and h/o biliroth II.   She states that on Saturday she woke up and starting having intense abd pain. She then felt nauseated and vomited. Her emesis was nonbloody. She has had rectal bleeding with every BM, blood is passed as dark clumps. She has not noticed blood on her underwear. Her abd pain is cosntant, worse with oral intake and movement. She has been taking aleeve for her abd pn without relief. She has has no relief of nausea with her PO zofran or phenergan suppositories. She last vomited today PTA. She reports she no longer drinks liquor, but she has 3 beers a day, and she last drank yesterday. She reports some light-headedness and dizziness, but denies syncope. She denies fevers, chills, CP, SOB or rash. She has an open incision from her surgery in 2014, which she reports as have normal color and consistency of drainage, but says it smells more foul than normal. She denies redness or pain around the incision site.    HPI  Past Medical History:  Diagnosis Date  . Abscess    soft tissue  . Adrenal mass ([place])   . Alcohol abuse   . Anxiety   . Blood transfusion without reported diagnosis   . Chronic abdominal pain   . Chronic wound infection of abdomen   . Colon polyp    colonoscopy 04/2014  . Depression   . Diverticulosis    colonoscopy 04/2014 moderat pan colonic  . Gastritis    EGD 05/2014  . Gastroparesis Nov 2015  . GERD (gastroesophageal reflux disease)   . Hemorrhoid    internal large  . Hiatal hernia   . History  of Billroth II operation   . Hypertension   . Lung nodule    CT 02/2014 needs repeat 1 month  . Lung nodule < 6cm on CT 04/25/2014  . Lupus   . Nausea and vomiting    chronic, recurrent  . Schatzki's ring    patent per EGD 04/2014  . Sickle cell trait ([place])   . Suicide attempt ([place])   . 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 Bright red rectal bleeding 08/22/2016  . GI bleed 05/24/2016  . Dieulafoy lesion of duodenum   . History of Billroth II operation   . Gastrointestinal hemorrhage 05/22/2016  . Absolute anemia   . Pancreatitis, acute   . Acute pancreatitis 10/01/2015  . Abnormal CT scan of lung 10/01/2015  . Alcohol intoxication ([place])   . Left-sided weakness   . Psychosomatic factor in physical condition   . Upper GI bleed   . Diverticulosis of colon with hemorrhage   . Chronic wound infection of abdomen 12/22/2014  . Thyroid disease 12/22/2014  . Hypertension 12/22/2014  . Ataxia 11/01/2014  . Hemorrhoids 04/26/2014  . Lung nodule 04/26/2014  . Diverticulosis   . Gastritis   . Hiatal hernia   . Schatzki's ring   . Acute blood loss  anemia 04/25/2014  . Sinus tachycardia 04/25/2014  . Hypokalemia 04/25/2014  . Hyponatremia 04/25/2014  . Hematemesis with nausea 04/25/2014  . Lung nodule < 6cm on CT 04/25/2014  . Intractable nausea and vomiting 04/24/2014  . Gastroparesis   . Abdominal pain 04/01/2014  . Chronic abdominal pain 01/21/2014  . Gastroenteritis 12/10/2013  . Chronic abdominal wound infection 08/16/2013  . MDD (major depressive disorder), recurrent episode, severe ([place]) 06/27/2013  . Wrist laceration 06/24/2013  . Nausea with vomiting 05/07/2013  . Diarrhea 05/07/2013  . Rectal bleeding 05/07/2013  . Abnormal LFTs 05/07/2013  . Adrenal mass, left ([place]) 05/07/2013  . Abdominal wall abscess at site of surgical wound 04/19/2013  . Abdominal wall abscess 04/18/2013  . Frequent headaches  03/30/2013  . Sleep difficulties 03/30/2013  . Essential hypertension, benign 03/30/2013  . History of cocaine abuse 03/16/2013  . History of schizoaffective disorder 03/16/2013  . Bipolar disorder ([place]) 03/16/2013  . Personality disorder 03/16/2013  . Tobacco abuse 03/16/2013  . Alcohol abuse 03/16/2013  . Palpitations 03/16/2013  . Poor vision 03/16/2013  . History of gastric bypass 03/16/2013  . Status post hysterectomy with oophorectomy 03/16/2013  . Hypothyroid 03/16/2013  . Lupus ([place]) 03/16/2013    Past Surgical History:  Procedure Laterality Date  . ABDOMINAL HYSTERECTOMY  2013   [place]  . ABDOMINAL SURGERY    . ADRENALECTOMY Right   . AGILE CAPSULE N/A 01/05/2015   Procedure: AGILE CAPSULE;  Surgeon: Daneil Dolin, MD;  Location: [facility];  Service: Endoscopy;  Laterality: N/A;  0700  . Billroth II procedure      [place], first 2000, 2005/2006.  Marland Kitchen BIOPSY  05/20/2013   Procedure: BIOPSIES OF ASCENDING AND SIGMOID COLON;  Surgeon: Daneil Dolin, MD;  Location: [facility] ORS;  Service: Endoscopy;;  . BIOPSY  04/26/2014   Procedure: BIOPSIES;  Surgeon: Danie Binder, MD;  Location: [facility];  Service: Endoscopy;;  . CHOLECYSTECTOMY    . COLONOSCOPY     in [place]  . COLONOSCOPY WITH PROPOFOL N/A 05/20/2013   Dr.Rourk- inadequate prep, normal appearing rectum, grossly normal colon aside from pancolonic diverticula, normal terminal ileum bx= unremarkable colonic mucosa. Due for early interval 2016.   Marland Kitchen COLONOSCOPY WITH PROPOFOL N/A 04/26/2014   [facility]:ENIDPO ileum/one colon polyp removed/moderate pan-colonic diverticulosis/large internal hemorrhoids  . COLONOSCOPY WITH PROPOFOL N/A 12/23/2014   Dr.Rourk- minimal internal hemorrhoids, pancolonic diverticulosis  . DEBRIDEMENT OF ABDOMINAL WALL ABSCESS N/A 02/08/2013   Procedure: DEBRIDEMENT OF ABDOMINAL WALL ABSCESS;  Surgeon: Jamesetta So, MD;  Location: [facility];  Service: General;  Laterality: N/A;  . ESOPHAGOGASTRODUODENOSCOPY  (EGD) WITH PROPOFOL N/A 05/20/2013   Dr.Rourk- s/p prior gastric surgery with normal esophagus, residual gastric mucosa and patent efferent limb  . ESOPHAGOGASTRODUODENOSCOPY (EGD) WITH PROPOFOL N/A 02/03/2014   Dr. Gala Romney:  s/p hemigastrectomy with retained gastric contents. Residual gastric mucosa and efferent limb appeared normal otherwise. Query gastroparesis.   Marland Kitchen ESOPHAGOGASTRODUODENOSCOPY (EGD) WITH PROPOFOL N/A 04/26/2014   [facility]:PNTIRWER'X ring/small HH/mild non-erosive gasrtitis/normal anastomosis  . ESOPHAGOGASTRODUODENOSCOPY (EGD) WITH PROPOFOL N/A 12/23/2014   Dr.Rourk- s/p prior hemigastrctomy, active oozing from anastomotic suture site, hemostasis achieved  . ESOPHAGOGASTRODUODENOSCOPY (EGD) WITH PROPOFOL N/A 05/23/2016   Dr. 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 tendon repar Right    wrist  . WOUND EXPLORATION Right 06/24/2013   Procedure: exploration of traumatic wound right wrist;  Surgeon: Jackolyn Confer  Fredna Dow, MD;  Location: [facility];  Service: Orthopedics;  Laterality: Right;    OB History    Gravida Para Term Preterm AB Living   4 4 4     3    SAB TAB Ectopic Multiple Live Births                   Home Medications    Prior to Admission medications   Medication Sig Start Date End Date Taking? Authorizing Provider  ibuprofen (ADVIL,MOTRIN) 600 MG tablet Take 1 tablet (600 mg total) by mouth 4 (four) times daily. 08/01/16  Yes Lily Kocher, PA-C  lisinopril (PRINIVIL,ZESTRIL) 10 MG tablet Take 10 mg by mouth daily.   Yes [provider]  ondansetron (ZOFRAN-ODT) 4 MG disintegrating tablet Take 1 tablet (4 mg total) by mouth every 8 (eight) hours as needed for nausea or vomiting. 07/14/16  Yes Davonna Belling, MD  pantoprazole (PROTONIX) 40 MG tablet Take 1 tablet (40 mg total) by mouth 2 (two) times daily before a meal. 09/04/14  Yes Daleen Bo, MD  promethazine (PHENERGAN)  12.5 MG suppository Place 1 suppository (12.5 mg total) rectally every 6 (six) hours as needed for nausea or vomiting. 08/08/16  Yes Annitta Needs, NP  cyclobenzaprine (FLEXERIL) 10 MG tablet Take 1 tablet (10 mg total) by mouth 2 (two) times daily as needed for muscle spasms. Patient not taking: Reported on 08/22/2016 08/01/16   Lily Kocher, PA-C  folic acid (FOLVITE) 1 MG tablet Take 1 tablet (1 mg total) by mouth daily. Patient not taking: Reported on 08/22/2016 10/08/15   Rosita Fire, MD  ondansetron (ZOFRAN) 4 MG tablet TAKE (1) TABLET BY MOUTH EVERY SIX HOURS. Patient not taking: Reported on 08/22/2016 05/17/16   Mahala Menghini, PA-C  polyethylene glycol [facility]) packet Take 17 g by mouth daily. Patient not taking: Reported on 08/22/2016 04/25/16   Evalee Jefferson, PA-C  polyethylene glycol-electrolytes (TRILYTE) 420 g solution Take 4,000 mLs by mouth as directed. Patient not taking: Reported on 08/22/2016 08/08/16   Rourk, Cristopher Estimable, MD    Family History Family History  Problem Relation Age of Onset  . Brain cancer Son   . Schizophrenia Son   . Cancer Son        brain  . Lung cancer Father   . Cancer Father        [facility]  . Drug abuse Mother   . Breast cancer Maternal Aunt   . Bipolar disorder Maternal Aunt   . Drug abuse Maternal Aunt   . Colon cancer Maternal Grandmother        late 51s, early 30s  . Drug abuse Sister   . Drug abuse Brother   . Bipolar disorder Paternal Grandfather   . Bipolar disorder Cousin   . Liver disease Neg Hx     Social History Social History  Substance Use Topics  . Smoking status: Current Every Day Smoker    Packs/day: 0.50    Years: 35.00    Types: Cigarettes  . Smokeless tobacco: Never Used  . Alcohol use 7.2 oz/week    12 Cans of beer per week     Comment: sometimes 3/ 12 oz beers a day     Allergies   Codeine; Morphine and related; and Reglan [metoclopramide]   Review of Systems Review of Systems  Constitutional: Positive for appetite  change. Negative for chills and fever.  Respiratory: Negative for cough, chest tightness and shortness of breath.   Cardiovascular: Negative for  chest pain and palpitations.  Gastrointestinal: Positive for abdominal distention, abdominal pain, blood in stool, nausea and vomiting. Negative for constipation and diarrhea.  Genitourinary: Positive for dysuria and frequency. Negative for hematuria, vaginal discharge and vaginal pain.  Musculoskeletal: Negative for back pain, neck pain and neck stiffness.  Skin: Negative for pallor and rash.  Neurological: Positive for dizziness and light-headedness. Negative for syncope and speech difficulty.  Psychiatric/Behavioral: Negative for confusion.     Physical Exam Updated Vital Signs BP 97/71   Pulse 93   Temp 98.1 F (36.7 C) (Oral)   Resp 13   Ht 5\' 3"  (1.6 m)   Wt 58.5 kg (129 lb)   SpO2 98%   BMI 22.85 kg/m   Physical Exam  Constitutional: She is oriented to person, place, and time. She appears well-developed and well-nourished.  Appears uncomfortable due to pain  HENT:  Head: Normocephalic and atraumatic.  Eyes: Conjunctivae and EOM are normal. Pupils are equal, round, and reactive to light.  Neck: Normal range of motion. Neck supple.  Cardiovascular: Normal rate, regular rhythm, normal heart sounds and intact distal pulses.   Pulmonary/Chest: Effort normal and breath sounds normal. No respiratory distress. She has no wheezes.  Abdominal: Soft. Bowel sounds are normal. There is tenderness (TTP of entire abd, worse in lower quadrants). There is no rigidity, no rebound and no guarding.  Incision site without erythema and minimal amounts of purulent drainage. Minimal tenderness to incision site. Does not appear to be of intra-abdominal origin  BRB seen on rectal exam. No masses felt at rectum.   Genitourinary: Rectal exam shows guaiac positive stool (BRB per rectum).  Musculoskeletal: Normal range of motion.  Neurological: She is alert  and oriented to person, place, and time.  Skin: Skin is warm and dry. No rash noted. She is not diaphoretic. No pallor.  Psychiatric: She has a normal mood and affect.  Nursing note and vitals reviewed.    ED Treatments / Results  Labs (all labs ordered are listed, but only abnormal results are displayed) Labs Reviewed  COMPREHENSIVE METABOLIC PANEL - Abnormal; Notable for the following:       Result Value   Sodium 130 (*)    Chloride 97 (*)    CO2 20 (*)    BUN <5 (*)    Calcium 8.7 (*)    Albumin 3.2 (*)    AST 172 (*)    ALT 56 (*)    Alkaline Phosphatase 130 (*)    All other components within normal limits  CBC - Abnormal; Notable for the following:    RBC 3.56 (*)    Hemoglobin 10.5 (*)    HCT 30.6 (*)    RDW 16.7 (*)    All other components within normal limits  ETHANOL - Abnormal; Notable for the following:    Alcohol, Ethyl (B) 22 (*)    All other components within normal limits  PROTIME-INR  LIPASE, BLOOD  URINALYSIS, ROUTINE W REFLEX MICROSCOPIC  POC OCCULT BLOOD, ED  TYPE AND SCREEN    EKG  EKG Interpretation  Date/Time:  Thursday August 22 2016 06:39:44 EDT Ventricular Rate:  94 PR Interval:    QRS Duration: 82 QT Interval:  393 QTC Calculation: 492 R Axis:   72 Text Interpretation:  Sinus rhythm Borderline prolonged QT interval Baseline wander in lead(s) I II aVR Since last tracing rate faster Confirmed by Noemi Chapel [phone]) on 08/22/2016 9:10:58 AM       Radiology Dg Abdomen  Acute W/chest  Result Date: 08/22/2016 CLINICAL DATA:  Recent rectal bleeding EXAM: DG ABDOMEN ACUTE W/ 1V CHEST COMPARISON:  Chest radiograph May 22, 2016; CT abdomen and pelvis May 22, 2016 FINDINGS: PA chest: Lungs are clear. Heart size and pulmonary vascularity are normal. No adenopathy. Supine and upright abdomen: There is moderate contrast in the colon. There is no bowel dilatation or air-fluid level to suggest bowel obstruction. No free air. There are multiple surgical  clips in the upper abdomen. IMPRESSION: No bowel dilatation or air-fluid level to suggest bowel obstruction. No free air. Moderate contrast is seen in the colon. Extensive postoperative change present in the abdomen. Lungs clear. Electronically Signed   By: Lowella Grip III M.D.   On: 08/22/2016 08:56    Procedures Procedures (including critical care time)  Medications Ordered in ED Medications  sodium chloride 0.9 % bolus 500 mL (500 mLs Intravenous New Bag/Given 08/22/16 0911)    Followed by  0.9 %  sodium chloride infusion (1,000 mLs Intravenous New Bag/Given 08/22/16 0911)  promethazine (PHENERGAN) injection 12.5 mg (12.5 mg Intramuscular Given 08/22/16 0817)  HYDROmorphone (DILAUDID) injection 1 mg (1 mg Intravenous Given 08/22/16 0829)  ondansetron (ZOFRAN) injection 4 mg (4 mg Intravenous Given 08/22/16 1005)     Initial Impression / Assessment and Plan / ED Course  I have reviewed the triage vital signs and the nursing notes.  Pertinent labs & imaging results that were available during my care of the patient were reviewed by me and considered in my medical decision making (see chart for details).  Clinical Course as of Aug 23 1302  Thu Aug 22, 2016  0715 Initial assessment has patient looking uncomfortable due to pain and nausea. Extensive abdominal history. Considering her history and symptoms, will likely need to be admitted.  Incision sites with some purulent drainage, but do not appear to be intra-abdominal, and do not appear to be source of pain. Hemoccult grossly positive with bright red blood. Will treat patient's pain and nausea at this time. We will order labs and imaging for further evaluation.  [[place]]  0959 On reassessment, patient states her pain is improved, but she still feel nauseous. She has not vomited since she's been to the hospital. X-ray reassuring as no free air or signs of bowel obstruction. 3 g drop in hemoglobin over the past month  [[place]]  1027 Consulted with Dr.  Gala Romney. He agrees that patient should be admitted. Will contact hospitalist for admission  [[place]]  1108 Hospitalist recommends patient goes to ICU.  [[place]]    Clinical Course User Index [[place]] Andyn Sales, PA-C    CRITICAL CARE Performed by: Franchot Heidelberg   Total critical care time: 30 minutes  Critical care time was exclusive of separately billable procedures and treating other patients.  Critical care was necessary to treat or prevent imminent or life-threatening deterioration.  Critical care was time spent personally by me on the following activities: development of treatment plan with patient and/or surrogate as well as nursing, discussions with consultants, evaluation of patient's response to treatment, examination of patient, obtaining history from patient or surrogate, ordering and performing treatments and interventions, ordering and review of laboratory studies, ordering and review of radiographic studies, pulse oximetry and re-evaluation of patient's condition.    Final Clinical Impressions(s) / ED Diagnoses   Final diagnoses:  Hypotension due to blood loss  Acute GI bleeding  Acute blood loss anemia    New Prescriptions New Prescriptions   No medications on  file     Franchot Heidelberg, PA-C 08/22/16 1304    Noemi Chapel, MD 08/23/16 0700

## 2016-08-22 NOTE — Consult Note (Signed)
Referring Provider: No ref. provider found Primary Care Physician:  Rosita Fire, MD Primary Gastroenterologist:  Dr. Gala Romney  Date of Admission: 08/22/16 Date of Consultation: 08/22/16  Reason for Consultation:  Rectal bleeding, abdominal pain, anemia  HPI:  SUTTYN CRYDER is a 51 y.o. female with a past medical history of gastric bypass, abdominal abscess, chronic abdominal wound infection, upper GI bleed, alcohol abuse, diverticulosis, lesion of duodenum, history of Billroth II operation. She presented to the emergency room with complaints of 5 day history of abdominal pain, nausea vomiting, rectal bleeding. ER provider notes reviewed. The patient was last seen in our office 07/09/2016 for rectal bleeding, non-intractable nausea and vomiting. At that time she noted bowel movements that look like chicken livers, burgundy, abdominal cramping and abnormalities and clots with rectal bleeding. Admitted to significant alcohol intake of 3-4 beers a day. On Protonix twice a day. Intolerance to Reglan. Recent EGD on file. Recommended upper GI series, Phenergan suppositories. Most recent colonoscopy in 2015 incomplete due to inadequate prep and overdue for colonoscopy. Previously admitted in March 2018 for GI bleed with red blood at anastomosis status post appendectomy epinephrine injection and clips secondary delta voice lesion at anastomosis. Recommended repeat colonoscopy which was scheduled for 09/23/2016. Also recommended alcohol cessation. Colonoscopy planned for propofol due to alcohol abuse.  In the emergency room and noted that on Saturday, approximately 5 days ago, she awoke with intense abdominal pain with nausea and vomiting which was nonbloody. Noted rectal bleeding with every bowel movement passes dark clumps, no blood noted in her underwear. Abdominal pain worse with oral intake and has been on Aleve for abdominal pain without relief no relief of nausea with Zofran or Phenergan suppositories.  Vomited just prior to admission in the emergency department, continues 3 beers a day and last drink yesterday. Open incision from surgery in 2014 with continued color and consistency of drainage but with a more foul odor than normal.  Labs in the emergency department show low sodium at 1:30, elevated AST/ALT at 172/56, elevated alkaline phosphatase at 130, albumin low at 3.2. CBC showed low hemoglobin at 10.5 which is approximately 3 g lower than 1 months ago when it was 13.2, but is within her baseline over the previous 2-3 months when she was 9-9.5. Platelets are normal at 192. No leukocytosis with white blood cell count of 5.0. INR is normal at 0.98, lipase normal at 22. Noted obvious large amounts of blood on rectal exam in the emergency department.  Today she states her abdomen started hurting again 5 days ago, it's worse than it was in the office. She is having red rectal bleeding with passing clots that ?look like chicken livers." She has also been having dizziness and lightheadedness. Has had several episodes of vomiting as well. Denies hematemesis, melena, chest pain, worsening dyspnea. Abdominal wound area looks the same to her, but has a more foul odor over the past couple days. Abdominal pain is in the area of her abdominal wound. No redness has been going on (per the patient.) Is having at least 1-2 bowel movements a day, tend to be looser and with blood/clots as noted above. Continues to drink 3-4 beers a day but is trying to cut back. Is having GERD symptoms as well. No other GI symptoms at this time.   Past Medical History:  Diagnosis Date  . Abscess    soft tissue  . Adrenal mass ([place])   . Alcohol abuse   . Anxiety   .  Blood transfusion without reported diagnosis   . Chronic abdominal pain   . Chronic wound infection of abdomen   . Colon polyp    colonoscopy 04/2014  . Depression   . Diverticulosis    colonoscopy 04/2014 moderat pan colonic  . Gastritis    EGD 05/2014  .  Gastroparesis Nov 2015  . GERD (gastroesophageal reflux disease)   . Hemorrhoid    internal large  . Hiatal hernia   . History of Billroth II operation   . Hypertension   . Lung nodule    CT 02/2014 needs repeat 1 month  . Lung nodule < 6cm on CT 04/25/2014  . Lupus   . Nausea and vomiting    chronic, recurrent  . Schatzki's ring    patent per EGD 04/2014  . Sickle cell trait ([place])   . Suicide attempt ([place])   . 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 ABDOMINAL HYSTERECTOMY  2013   [place]  . ABDOMINAL SURGERY    . ADRENALECTOMY Right   . AGILE CAPSULE N/A 01/05/2015   Procedure: AGILE CAPSULE;  Surgeon: Daneil Dolin, MD;  Location: [facility];  Service: Endoscopy;  Laterality: N/A;  0700  . Billroth II procedure      [place], first 2000, 2005/2006.  Marland Kitchen BIOPSY  05/20/2013   Procedure: BIOPSIES OF ASCENDING AND SIGMOID COLON;  Surgeon: Daneil Dolin, MD;  Location: [facility];  Service: Endoscopy;;  . BIOPSY  04/26/2014   Procedure: BIOPSIES;  Surgeon: Danie Binder, MD;  Location: [facility];  Service: Endoscopy;;  . CHOLECYSTECTOMY    . COLONOSCOPY     in [place]  . COLONOSCOPY WITH PROPOFOL N/A 05/20/2013   Dr.Rourk- inadequate prep, normal appearing rectum, grossly normal colon aside from pancolonic diverticula, normal terminal ileum bx= unremarkable colonic mucosa. Due for early interval 2016.   Marland Kitchen COLONOSCOPY WITH PROPOFOL N/A 04/26/2014   JWJ:[id number] ileum/one colon polyp removed/moderate pan-colonic diverticulosis/large internal hemorrhoids  . COLONOSCOPY WITH PROPOFOL N/A 12/23/2014   Dr.Rourk- minimal internal hemorrhoids, pancolonic diverticulosis  . DEBRIDEMENT OF ABDOMINAL WALL ABSCESS N/A 02/08/2013   Procedure: DEBRIDEMENT OF ABDOMINAL WALL ABSCESS;  Surgeon: Jamesetta So, MD;  Location: [facility];  Service: General;  Laterality: N/A;  . ESOPHAGOGASTRODUODENOSCOPY (EGD) WITH PROPOFOL N/A 05/20/2013   Dr.Rourk-  s/p prior gastric surgery with normal esophagus, residual gastric mucosa and patent efferent limb  . ESOPHAGOGASTRODUODENOSCOPY (EGD) WITH PROPOFOL N/A 02/03/2014   Dr. Gala Romney:  s/p hemigastrectomy with retained gastric contents. Residual gastric mucosa and efferent limb appeared normal otherwise. Query gastroparesis.   Marland Kitchen ESOPHAGOGASTRODUODENOSCOPY (EGD) WITH PROPOFOL N/A 04/26/2014   [id number] ring/small HH/mild non-erosive gasrtitis/normal anastomosis  . ESOPHAGOGASTRODUODENOSCOPY (EGD) WITH PROPOFOL N/A 12/23/2014   Dr.Rourk- s/p prior hemigastrctomy, active oozing from anastomotic suture site, hemostasis achieved  . ESOPHAGOGASTRODUODENOSCOPY (EGD) WITH PROPOFOL N/A 05/23/2016   Dr. 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 tendon repar Right    wrist  . WOUND EXPLORATION Right 06/24/2013   Procedure: exploration of traumatic wound right wrist;  Surgeon: Tennis Must, MD;  Location: [place];  Service: Orthopedics;  Laterality: Right;    Prior to Admission medications   Medication Sig Start Date End Date Taking? Authorizing Provider  ibuprofen (ADVIL,MOTRIN) 600 MG tablet Take 1 tablet (600 mg total) by mouth 4 (four) times daily. 08/01/16  Yes Lily Kocher, PA-C  lisinopril (PRINIVIL,ZESTRIL) 10 MG tablet Take 10 mg by mouth daily.   Yes [provider]  ondansetron (ZOFRAN-ODT) 4 MG disintegrating tablet Take 1 tablet (4 mg total) by mouth every 8 (eight) hours as needed for nausea or vomiting. 07/14/16  Yes Davonna Belling, MD  pantoprazole (PROTONIX) 40 MG tablet Take 1 tablet (40 mg total) by mouth 2 (two) times daily before a meal. 09/04/14  Yes Daleen Bo, MD  promethazine (PHENERGAN) 12.5 MG suppository Place 1 suppository (12.5 mg total) rectally every 6 (six) hours as needed for nausea or vomiting. 08/08/16  Yes Annitta Needs, NP  cyclobenzaprine (FLEXERIL) 10 MG tablet Take 1  tablet (10 mg total) by mouth 2 (two) times daily as needed for muscle spasms. Patient not taking: Reported on 08/22/2016 08/01/16   Lily Kocher, PA-C  folic acid (FOLVITE) 1 MG tablet Take 1 tablet (1 mg total) by mouth daily. Patient not taking: Reported on 08/22/2016 10/08/15   Rosita Fire, MD  ondansetron (ZOFRAN) 4 MG tablet TAKE (1) TABLET BY MOUTH EVERY SIX HOURS. Patient not taking: Reported on 08/22/2016 05/17/16   Mahala Menghini, PA-C  polyethylene glycol [facility]) packet Take 17 g by mouth daily. Patient not taking: Reported on 08/22/2016 04/25/16   Evalee Jefferson, PA-C  polyethylene glycol-electrolytes (TRILYTE) 420 g solution Take 4,000 mLs by mouth as directed. Patient not taking: Reported on 08/22/2016 08/08/16   Daneil Dolin, MD    Current Facility-Administered Medications  Medication Dose Route Frequency Provider Last Rate Last Dose  . 0.9 %  sodium chloride infusion  1,000 mL Intravenous Continuous Caccavale, Sophia, PA-C 125 mL/hr at 08/22/16 0911 1,000 mL at 08/22/16 0911   Current Outpatient Prescriptions  Medication Sig Dispense Refill  . ibuprofen (ADVIL,MOTRIN) 600 MG tablet Take 1 tablet (600 mg total) by mouth 4 (four) times daily. 30 tablet 0  . lisinopril (PRINIVIL,ZESTRIL) 10 MG tablet Take 10 mg by mouth daily.    . ondansetron (ZOFRAN-ODT) 4 MG disintegrating tablet Take 1 tablet (4 mg total) by mouth every 8 (eight) hours as needed for nausea or vomiting. 8 tablet 0  . pantoprazole (PROTONIX) 40 MG tablet Take 1 tablet (40 mg total) by mouth 2 (two) times daily before a meal. 60 tablet 0  . promethazine (PHENERGAN) 12.5 MG suppository Place 1 suppository (12.5 mg total) rectally every 6 (six) hours as needed for nausea or vomiting. 12 each 2  . cyclobenzaprine (FLEXERIL) 10 MG tablet Take 1 tablet (10 mg total) by mouth 2 (two) times daily as needed for muscle spasms. (Patient not taking: Reported on 08/22/2016) 12 tablet 0  . folic acid (FOLVITE) 1 MG tablet Take 1 tablet  (1 mg total) by mouth daily. (Patient not taking: Reported on 08/22/2016) 30 tablet 3  . ondansetron (ZOFRAN) 4 MG tablet TAKE (1) TABLET BY MOUTH EVERY SIX HOURS. (Patient not taking: Reported on 08/22/2016) 30 tablet 0  . polyethylene glycol (MIRALAX) packet Take 17 g by mouth daily. (Patient not taking: Reported on 08/22/2016) 30 each 0  . polyethylene glycol-electrolytes (TRILYTE) 420 g solution Take 4,000 mLs by mouth as directed. (Patient not taking: Reported on 08/22/2016) 4000 mL 0    Allergies as of 08/22/2016 - Review Complete 08/22/2016  Allergen Reaction Noted  . Codeine Hives 02/06/2013  . Morphine and related Hives 02/06/2013  . Reglan [metoclopramide] Other (See Comments) 08/08/2016    Family History  Problem Relation Age of Onset  . Brain cancer  Son   . Schizophrenia Son   . Cancer Son        brain  . Lung cancer Father   . Cancer Father        mets  . Drug abuse Mother   . Breast cancer Maternal Aunt   . Bipolar disorder Maternal Aunt   . Drug abuse Maternal Aunt   . Colon cancer Maternal Grandmother        late 10s, early 77s  . Drug abuse Sister   . Drug abuse Brother   . Bipolar disorder Paternal Grandfather   . Bipolar disorder Cousin   . Liver disease Neg Hx     Social History   Social History  . Marital status: Legally Separated    Spouse name: N/A  . Number of children: N/A  . Years of education: N/A   Occupational History  . Not on file.   Social History Main Topics  . Smoking status: Current Every Day Smoker    Packs/day: 0.50    Years: 35.00    Types: Cigarettes  . Smokeless tobacco: Never Used  . Alcohol use 7.2 oz/week    12 Cans of beer per week     Comment: sometimes 3/ 12 oz beers a day  . Drug use: No     Comment: quit 09/2012  . Sexual activity: Yes    Birth control/ protection: Surgical   Other Topics Concern  . Not on file   Social History Narrative  . No narrative on file    Review of Systems: General: Negative for  anorexia, weight loss, fever, chills. Eyes: Negative for vision changes.  ENT: Negative for hoarseness, difficulty swallowing. CV: Negative for chest pain, angina, palpitations, peripheral edema.  Respiratory: Negative for dyspnea at rest, cough, sputum, wheezing.  GI: See history of present illness. MS: Negative for joint pain, low back pain.  Derm: Negative for rash or itching.  Neuro: Admits weakness, Negative for seizure, frequent headaches, memory loss, confusion.  Endo: Negative for unusual weight change.  Heme: Negative for bruising or bleeding. Allergy: Negative for rash or hives.  Physical Exam: Vital signs in last 24 hours: Temp:  [98.1 F (36.7 C)] 98.1 F (36.7 C) (06/07 0636) Pulse Rate:  [82-107] 93 (06/07 1040) Resp:  [13-30] 13 (06/07 1040) BP: (97-130)/(71-96) 97/71 (06/07 1040) SpO2:  [96 %-99 %] 98 % (06/07 1040) Weight:  [129 lb (58.5 kg)] 129 lb (58.5 kg) (06/07 0636)   General:   Alert,  Well-developed, well-nourished, pleasant and cooperative in NAD Head:  Normocephalic and atraumatic. Eyes:  Sclera clear, no icterus. Conjunctiva pink. Ears:  Normal auditory acuity. Neck:  Supple; no masses or thyromegaly. Lungs:  Clear throughout to auscultation. No wheezes, crackles, or rhonchi. No acute distress. Coughing noted during exam. Heart:  Regular rate and rhythm; no murmurs, clicks, rubs,  or gallops. Apparently tachycardic rate confirmed on telemetry. Abdomen:  Soft, and nondistended. TTP RUQ and around abdominal dressing. No masses, hepatosplenomegaly or hernias noted. Normal bowel sounds, without guarding, and without rebound.   Rectal:  Deferred.   Msk:  Symmetrical without gross deformities. Pulses:  Normal bilateral DP pulses noted. Extremities:  Without clubbing or edema. Neurologic:  Alert and  oriented x4;  grossly normal neurologically. Skin:  Midline abdominal dressing noted clean, dry, and intact. Psych:  Alert and cooperative. Normal mood and  affect.  Intake/Output from previous day: No intake/output data recorded. Intake/Output this shift: No intake/output data recorded.  Lab Results:  Recent Labs  08/22/16 0915  WBC 5.0  HGB 10.5*  HCT 30.6*  PLT 192   BMET  Recent Labs  08/22/16 0915  NA 130*  K 3.9  CL 97*  CO2 20*  GLUCOSE 70  BUN <5*  CREATININE 0.45  CALCIUM 8.7*   LFT  Recent Labs  08/22/16 0915  PROT 6.9  ALBUMIN 3.2*  AST 172*  ALT 56*  ALKPHOS 130*  BILITOT 1.0   PT/INR  Recent Labs  08/22/16 0915  LABPROT 13.0  INR 0.98   Hepatitis Panel No results for input(s): HEPBSAG, HCVAB, HEPAIGM, HEPBIGM in the last 72 hours. C-Diff No results for input(s): CDIFFTOX in the last 72 hours.  Studies/Results: Dg Abdomen Acute W/chest  Result Date: 08/22/2016 CLINICAL DATA:  Recent rectal bleeding EXAM: DG ABDOMEN ACUTE W/ 1V CHEST COMPARISON:  Chest radiograph May 22, 2016; CT abdomen and pelvis May 22, 2016 FINDINGS: PA chest: Lungs are clear. Heart size and pulmonary vascularity are normal. No adenopathy. Supine and upright abdomen: There is moderate contrast in the colon. There is no bowel dilatation or air-fluid level to suggest bowel obstruction. No free air. There are multiple surgical clips in the upper abdomen. IMPRESSION: No bowel dilatation or air-fluid level to suggest bowel obstruction. No free air. Moderate contrast is seen in the colon. Extensive postoperative change present in the abdomen. Lungs clear. Electronically Signed   By: Lowella Grip III M.D.   On: 08/22/2016 08:56    Impression: Pleasant 51 year old female presented to the emergency room for similar complaints 20 was last in our office 2 weeks ago. She is having continuing and worsening rectal bleeding with associated clots. Also with abdominal pain, nausea and vomiting. No hematemesis. No melena. She does have mid abdominal pain around the area of her dressing for chronic abdominal wound with no change in color  or drainage other than a slight change in the odor of her drainage noted to be more file. Her dressing today is clean dry and intact. Rectal exam by EGD provider demonstrated positive gross blood. Her vital signs are soft with hypertension and systolic blood pressure at 90 with tachycardia and rate at 110 apparently normal sinus tachycardia. She is having associated weakness and lightheadedness. No chest pain or dyspnea. Presentation suggestive of lower GI bleeding with clots, 3 g drop in hemoglobin from 1 month prior, and associated vital sign changes. She is being admitted to stepdown. Lower GI bleed would best be evaluated by colonoscopy. She is scheduled for an outpatient exam in the mid July but this is likely too far from this point. Last colonoscopy in 2015 was incomplete due to poor prep.  Possible differentials include significant internal versus external hemorrhoids, bleeding polyp, rectal ulcer, less likely colorectal cancer.  Plan: 1. Monitor for further GI bleeding. 2. Monitor hemoglobin closely. 3. Transfuse as necessary. 4. Plan colonoscopy on propofol tomorrow due to chronic alcohol abuse. 5. Supportive measures 6. Further recommendations to be based on procedure results.   Thank you for allowing [place] to participate in the care of Lorayne Marek, DNP, AGNP-C Adult & Gerontological Nurse Practitioner [facility]    LOS: 0 days     08/22/2016, 1:08 PM

## 2016-08-22 NOTE — ED Notes (Signed)
EKG seen by Dr Wyvonnia Dusky

## 2016-08-22 NOTE — ED Provider Notes (Signed)
The patient is a 51 year old female, she is chronically ill with multiple medical problems including multiple gastrointestinal problems. She has had a chronic GI bleed which seems to get worse at times, she has a known ill or off to procedure and has been thought to be bleeding from the anastomosis. She was admitted recently for the same, she is scheduled to have a July colonoscopy however she reports that she is having increased amounts of blood per rectum, feeling generally weak, she is nauseated and vomiting. She is a chronic alcoholic, she has alcoholic pancreatitis, she continues to try to drink but states that she is even been vomiting her beer. On exam the patient has a soft abdomen, she describes midabdominal tenderness but does not appear to have any peritoneal signs. She has a dressing over the mid abdomen over the 2 small drainage sinuses, both of which appear to have a purulent material on the dressing but only a very small amount of purulence expressed from the sinuses. She is not having tenderness redness or induration over those areas. She is mildly tachycardic, she appears slightly dehydrated. She has no peripheral edema.  ROS:  No fevers, chills, headache, sore throat, visual changes, neck pain, back pain, chest pain,, shortness of breath, cough, dysuria, diarrhea, + for rectal bleeding, nausea and poor appetite.  swelling, rashes, numbness or weakness.  All other systems reviewed and were negative   The patient likely needs to be admitted for ongoing gastrointestinal bleeding. She may need resuscitation, we'll start IV fluids, check renal function, I do not think that the purulence expressed from her chronic draining wounds is from an intra-abdominal source but more likely to be subcutaneous. There is no obvious drainable abscess. The patient is in agreement with the plan. Anticipate admission.  The pt has a sig drop in Hgb, has been hypotensive, requiring multiple fluid boluses D/w  hospitaslist - Dr. Marin Comment who agrees needs ICU tonight - Dr. Gala Romney will consult  CRITICAL CARE Performed by: Johnna Acosta Total critical care time: 35 minutes Critical care time was exclusive of separately billable procedures and treating other patients. Critical care was necessary to treat or prevent imminent or life-threatening deterioration. Critical care was time spent personally by me on the following activities: development of treatment plan with patient and/or surrogate as well as nursing, discussions with consultants, evaluation of patient's response to treatment, examination of patient, obtaining history from patient or surrogate, ordering and performing treatments and interventions, ordering and review of laboratory studies, ordering and review of radiographic studies, pulse oximetry and re-evaluation of patient's condition.   Medical screening examination/treatment/procedure(s) were conducted as a shared visit with non-physician practitioner(s) and myself.  I personally evaluated the patient during the encounter.  Clinical Impression:   Final diagnoses:  Hypotension due to blood loss  Acute GI bleeding  Acute blood loss anemia         Noemi Chapel, MD 08/22/16 [phone]

## 2016-08-23 ENCOUNTER — Encounter (HOSPITAL_COMMUNITY)
Admission: EM | Disposition: A | Discharge status: Home / Self Care | Payer: Self-pay | Source: Home / Self Care | Attending: Internal Medicine

## 2016-08-23 ENCOUNTER — Inpatient Hospital Stay (HOSPITAL_COMMUNITY): Payer: Medicare HMO | Admitting: Anesthesiology

## 2016-08-23 DIAGNOSIS — K921 Melena: Secondary | ICD-10-CM

## 2016-08-23 DIAGNOSIS — K648 Other hemorrhoids: Principal | ICD-10-CM

## 2016-08-23 DIAGNOSIS — K5730 Diverticulosis of large intestine without perforation or abscess without bleeding: Secondary | ICD-10-CM

## 2016-08-23 HISTORY — PX: COLONOSCOPY WITH PROPOFOL: SHX5780

## 2016-08-23 LAB — CBC
HCT: 28.8 % — ABNORMAL LOW (ref 36.0–46.0)
HCT: 25.4 % — ABNORMAL LOW (ref 36.0–46.0)
HEMATOCRIT: 30.5 % — AB (ref 36.0–46.0)
HEMOGLOBIN: 10.3 g/dL — AB (ref 12.0–15.0)
Hemoglobin: 9.8 g/dL — ABNORMAL LOW (ref 12.0–15.0)
Hemoglobin: 8.6 g/dL — ABNORMAL LOW (ref 12.0–15.0)
MCH: 29.7 pg (ref 26.0–34.0)
MCH: 29.9 pg (ref 26.0–34.0)
MCH: 29.9 pg (ref 26.0–34.0)
MCHC: 33.8 g/dL (ref 30.0–36.0)
MCHC: 33.9 g/dL (ref 30.0–36.0)
MCHC: 34 g/dL (ref 30.0–36.0)
MCV: 87.8 fL (ref 78.0–100.0)
MCV: 87.9 fL (ref 78.0–100.0)
MCV: 88.2 fL (ref 78.0–100.0)
PLATELETS: 168 10*3/uL (ref 150–400)
PLATELETS: 154 10*3/uL (ref 150–400)
Platelets: 171 10*3/uL (ref 150–400)
RBC: 2.88 MIL/uL — ABNORMAL LOW (ref 3.87–5.11)
RBC: 3.28 MIL/uL — ABNORMAL LOW (ref 3.87–5.11)
RBC: 3.47 MIL/uL — ABNORMAL LOW (ref 3.87–5.11)
RDW: 16.6 % — AB (ref 11.5–15.5)
RDW: 16.7 % — AB (ref 11.5–15.5)
RDW: 16.7 % — ABNORMAL HIGH (ref 11.5–15.5)
WBC: 4.3 10*3/uL (ref 4.0–10.5)
WBC: 4.5 10*3/uL (ref 4.0–10.5)
WBC: 4.7 10*3/uL (ref 4.0–10.5)

## 2016-08-23 SURGERY — COLONOSCOPY WITH PROPOFOL
Anesthesia: Monitor Anesthesia Care

## 2016-08-23 MED ORDER — GLYCOPYRROLATE 0.2 MG/ML IJ SOLN
0.2000 mg | Freq: Once | INTRAMUSCULAR | Status: AC | PRN
  Administered 2016-08-23: 0.2 mg via INTRAVENOUS

## 2016-08-23 MED ORDER — LIDOCAINE HCL (PF) 1 % IJ SOLN
INTRAMUSCULAR | Status: AC
  Filled 2016-08-23: qty 5

## 2016-08-23 MED ORDER — DEXAMETHASONE SODIUM PHOSPHATE 4 MG/ML IJ SOLN
INTRAMUSCULAR | Status: AC
  Filled 2016-08-23: qty 1

## 2016-08-23 MED ORDER — LIDOCAINE HCL (CARDIAC) 10 MG/ML IV SOLN
INTRAVENOUS | Status: DC | PRN
  Administered 2016-08-23: 50 mg via INTRAVENOUS

## 2016-08-23 MED ORDER — LORAZEPAM 2 MG/ML IJ SOLN
0.0000 mg | Freq: Four times a day (QID) | INTRAMUSCULAR | Status: DC
  Administered 2016-08-24 (×4): 2 mg via INTRAVENOUS
  Filled 2016-08-23 (×4): qty 1

## 2016-08-23 MED ORDER — DIPHENHYDRAMINE HCL 50 MG/ML IJ SOLN
INTRAMUSCULAR | Status: AC
  Filled 2016-08-23: qty 1

## 2016-08-23 MED ORDER — GLYCOPYRROLATE 0.2 MG/ML IJ SOLN
INTRAMUSCULAR | Status: AC
  Filled 2016-08-23: qty 1

## 2016-08-23 MED ORDER — LORAZEPAM 2 MG/ML IJ SOLN: 0.0000 mg | Freq: Two times a day (BID) | INTRAMUSCULAR | Status: DC

## 2016-08-23 MED ORDER — MIDAZOLAM HCL 2 MG/2ML IJ SOLN
2.0000 mg | Freq: Once | INTRAMUSCULAR | Status: AC
  Administered 2016-08-23: 2 mg via INTRAVENOUS

## 2016-08-23 MED ORDER — LACTATED RINGERS IV SOLN
INTRAVENOUS | Status: DC
  Administered 2016-08-23: 08:00:00 via INTRAVENOUS

## 2016-08-23 MED ORDER — LORAZEPAM 1 MG PO TABS
0.0000 mg | ORAL_TABLET | Freq: Four times a day (QID) | ORAL | Status: DC
Start: 2016-08-24 — End: 2016-08-23

## 2016-08-23 MED ORDER — ONDANSETRON HCL 4 MG/2ML IJ SOLN
4.0000 mg | Freq: Once | INTRAMUSCULAR | Status: AC
Start: 2016-08-23 — End: 2016-08-23
  Administered 2016-08-23: 4 mg via INTRAVENOUS

## 2016-08-23 MED ORDER — MIDAZOLAM HCL 2 MG/2ML IJ SOLN
INTRAMUSCULAR | Status: AC
  Filled 2016-08-23: qty 2

## 2016-08-23 MED ORDER — LORAZEPAM 1 MG PO TABS: 0.0000 mg | ORAL_TABLET | Freq: Two times a day (BID) | ORAL | Status: DC

## 2016-08-23 MED ORDER — MIDAZOLAM HCL 2 MG/2ML IJ SOLN
1.0000 mg | INTRAMUSCULAR | Status: AC
Start: 2016-08-23 — End: 2016-08-23
  Administered 2016-08-23 (×2): 2 mg via INTRAVENOUS
  Filled 2016-08-23: qty 2

## 2016-08-23 MED ORDER — FENTANYL CITRATE (PF) 100 MCG/2ML IJ SOLN
25.0000 ug | INTRAMUSCULAR | Status: DC | PRN
  Administered 2016-08-23: 50 ug via INTRAVENOUS

## 2016-08-23 MED ORDER — PANTOPRAZOLE SODIUM 40 MG PO TBEC
40.0000 mg | DELAYED_RELEASE_TABLET | Freq: Every day | ORAL | Status: DC
  Administered 2016-08-24 – 2016-08-25 (×2): 40 mg via ORAL
  Filled 2016-08-23 (×2): qty 1

## 2016-08-23 MED ORDER — ONDANSETRON HCL 4 MG/2ML IJ SOLN
INTRAMUSCULAR | Status: AC
  Filled 2016-08-23: qty 2

## 2016-08-23 MED ORDER — FENTANYL CITRATE (PF) 100 MCG/2ML IJ SOLN
INTRAMUSCULAR | Status: AC
  Filled 2016-08-23: qty 2

## 2016-08-23 MED ORDER — PROPOFOL 10 MG/ML IV BOLUS
INTRAVENOUS | Status: AC
  Filled 2016-08-23: qty 60

## 2016-08-23 MED ORDER — PROPOFOL 10 MG/ML IV BOLUS
INTRAVENOUS | Status: DC | PRN
  Administered 2016-08-23 (×3): 20 mg via INTRAVENOUS

## 2016-08-23 MED ORDER — LORAZEPAM 2 MG/ML IJ SOLN: 1.0000 mg | INTRAMUSCULAR | Status: DC | PRN

## 2016-08-23 MED ORDER — HYDROCORTISONE 2.5 % RE CREA
TOPICAL_CREAM | Freq: Four times a day (QID) | RECTAL | Status: DC
  Administered 2016-08-23: 14:00:00 via RECTAL
  Administered 2016-08-23: 1 via RECTAL
  Administered 2016-08-23 – 2016-08-24 (×3): via RECTAL
  Administered 2016-08-24: 1 via RECTAL
  Administered 2016-08-24 – 2016-08-25 (×2): via RECTAL
  Filled 2016-08-23: qty 28.35

## 2016-08-23 MED ORDER — DEXAMETHASONE SODIUM PHOSPHATE 10 MG/ML IJ SOLN
4.0000 mg | Freq: Once | INTRAMUSCULAR | Status: AC
  Administered 2016-08-23: 4 mg via INTRAVENOUS
  Filled 2016-08-23: qty 0.4

## 2016-08-23 MED ORDER — DIPHENHYDRAMINE HCL 50 MG/ML IJ SOLN
25.0000 mg | Freq: Once | INTRAMUSCULAR | Status: AC
  Administered 2016-08-23: 25 mg via INTRAVENOUS

## 2016-08-23 MED ORDER — PROPOFOL 500 MG/50ML IV EMUL
INTRAVENOUS | Status: DC | PRN
  Administered 2016-08-23: 125 ug/kg/min via INTRAVENOUS
  Administered 2016-08-23: 150 ug/kg/min via INTRAVENOUS

## 2016-08-23 MED ORDER — FENTANYL CITRATE (PF) 100 MCG/2ML IJ SOLN
25.0000 ug | Freq: Once | INTRAMUSCULAR | Status: AC
  Administered 2016-08-23: 25 ug via INTRAVENOUS

## 2016-08-23 NOTE — Progress Notes (Signed)
Dr Patsey Berthold notified of continues itching. Orders given.

## 2016-08-23 NOTE — Progress Notes (Signed)
Awake. Restless. Does not lay still. C/O itching all over. No rash or raised areas present. Skin warm and dry to touch.

## 2016-08-23 NOTE — Transfer of Care (Signed)
Immediate Anesthesia Transfer of Care Note  Patient: Angel Price  Procedure(s) Performed: Procedure(s): COLONOSCOPY WITH PROPOFOL (N/A)  Patient Location: PACU  Anesthesia Type:MAC  Level of Consciousness: awake and alert   Airway & Oxygen Therapy: Patient Spontanous Breathing  Post-op Assessment: Report given to RN and Post -op Vital signs reviewed and stable  Post vital signs: Reviewed and stable  Last Vitals:  Vitals:   08/23/16 0805 08/23/16 0810  BP: 99/71 (!) 94/59  Pulse:    Resp: (!) 54 20  Temp:      Last Pain:  Vitals:   08/23/16 0400  TempSrc: Oral  PainSc:       Patients Stated Pain Goal: 4 (45/99/77 4142)  Complications: No apparent anesthesia complications

## 2016-08-23 NOTE — Op Note (Addendum)
[facility] Patient Name: Angel Price Procedure Date: 08/23/2016 8:03 AM MRN: [id number] Date of Birth: 1966/01/07 Attending MD: [name] , MD CSN: [id number] Age: 51 Admit Type: Inpatient Procedure:                Colonoscopy, DIAGNOSTIC Indications:              Hematochezia Providers:                [name], MD, Lurline Del, RN, Aram Candela Referring MD:             Rosita Fire MD, MD Medicines:                Propofol per Anesthesia Complications:            No immediate complications. Estimated Blood Loss:     Estimated blood loss: none. Procedure:                Pre-Anesthesia Assessment:                           - Prior to the procedure, a History and Physical                            was performed, and patient medications and                            allergies were reviewed. The patient's tolerance of                            previous anesthesia was also reviewed. The risks                            and benefits of the procedure and the sedation                            options and risks were discussed with the patient.                            All questions were answered, and informed consent                            was obtained. Prior Anticoagulants: The patient has                            taken ibuprofen, last dose was 2 days prior to                            procedure. ASA Grade Assessment: II - A patient                            with mild systemic disease. After reviewing the                            risks and benefits, the patient was deemed in  satisfactory condition to undergo the procedure.                            After obtaining informed consent, the colonoscope                            was passed under direct vision. Throughout the                            procedure, the patient's blood pressure, pulse, and                            oxygen saturations were monitored continuously. The                       EC-[id number] ([id number]) scope was introduced through                            the anus and advanced to the 10 cm into the ileum.                            The colonoscopy was somewhat difficult due to a                            tortuous colon. Successful completion of the                            procedure was aided by COLOWRAP. The patient                            tolerated the procedure well. The quality of the                            bowel preparation was good. The terminal ileum,                            ileocecal valve, appendiceal orifice, and rectum                            were photographed. Scope In: 8:56:31 AM Scope Out: 9:12:16 AM Scope Withdrawal Time: 0 hours 11 minutes 52 seconds  Total Procedure Duration: 0 hours 15 minutes 45 seconds  Findings:      The terminal ileum appeared normal.      Multiple small and large-mouthed diverticula were found in the hepatic       flexure, ascending colon and cecum.      The recto-sigmoid colon, sigmoid colon and descending colon were       moderately redundant.      Non-bleeding internal hemorrhoids were found during retroflexion. The       hemorrhoids were large. Impression:               - NO SOURCE FOR ABDOMINAL PAIN IDENTIFIED AND MOST                            LIKELY  DUE TO NSAID ENTEROPATHY/FUNCTIONAL                            ABDOMINAL PAIN                           - MODERATE Diverticulosis at the hepatic flexure,                            in the ascending colon and in the cecum.                           - Redundant LEFT colon.                           - RECTAL BLEEDING DUE TO internal hemorrhoids. Moderate Sedation:      Per Anesthesia Care Recommendation:           - Repeat colonoscopy in 10 years for surveillance.                           - Chopped diet. AVOID ETOH.                           - Continue present medications. ADD ANUSOL CREAM                            QID FOR 10 DAYS.  SEE SURGERY FOR HEMORRHOIDECTOMY.                           - Return patient to hospital ward for ongoing care.                           - REFER TO PAINMANAGEMENT. AVOID IBUPROFEN. USE                            ULTRAM AND NEURONTIN FOR CHRONIC PAIN. Procedure Code(s):        --- Professional ---                           [phone], Colonoscopy, flexible; diagnostic, including                            collection of specimen(s) by brushing or washing,                            when performed (separate procedure) Diagnosis Code(s):        --- Professional ---                           E52.7, Other hemorrhoids                           K92.1, Melena (includes Hematochezia)                           K57.30,  Diverticulosis of large intestine without                            perforation or abscess without bleeding                           Q43.8, Other specified congenital malformations of                            intestine CPT copyright 2016 American Medical Association. All rights reserved. The codes documented in this report are preliminary and upon coder review may  be revised to meet current compliance requirements. [name], MD [name], MD 08/23/2016 9:25:23 AM This report has been signed electronically. Number of Addenda: 0

## 2016-08-23 NOTE — H&P (Signed)
Primary Care Physician:  Rosita Fire, MD Primary Gastroenterologist:  Dr. Oneida Alar  Pre-Procedure History & Physical: HPI:  Angel Price is a 51 y.o. female here for  BRBPR.  Past Medical History:  Diagnosis Date  . Abscess    soft tissue  . Adrenal mass ([place])   . Alcohol abuse   . Anxiety   . Blood transfusion without reported diagnosis   . Chronic abdominal pain   . Chronic wound infection of abdomen   . Colon polyp    colonoscopy 04/2014  . Depression   . Diverticulosis    colonoscopy 04/2014 moderat pan colonic  . Gastritis    EGD 05/2014  . Gastroparesis Nov 2015  . GERD (gastroesophageal reflux disease)   . Hemorrhoid    internal large  . Hiatal hernia   . History of Billroth II operation   . Hypertension   . Lung nodule    CT 02/2014 needs repeat 1 month  . Lung nodule < 6cm on CT 04/25/2014  . Lupus   . Nausea and vomiting    chronic, recurrent  . Schatzki's ring    patent per EGD 04/2014  . Sickle cell trait ([place])   . Suicide attempt ([place])   . 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 ABDOMINAL HYSTERECTOMY  2013   [place]  . ABDOMINAL SURGERY    . ADRENALECTOMY Right   . AGILE CAPSULE N/A 01/05/2015   Procedure: AGILE CAPSULE;  Surgeon: Daneil Dolin, MD;  Location: [facility];  Service: Endoscopy;  Laterality: N/A;  0700  . Billroth II procedure      [place], first 2000, 2005/2006.  Marland Kitchen BIOPSY  05/20/2013   Procedure: BIOPSIES OF ASCENDING AND SIGMOID COLON;  Surgeon: Daneil Dolin, MD;  Location: [facility] ORS;  Service: Endoscopy;;  . BIOPSY  04/26/2014   Procedure: BIOPSIES;  Surgeon: Danie Binder, MD;  Location: [facility] OR[facility];  Service: Endoscopy;;  . CHOLECYSTECTOMY    . COLONOSCOPY     in [place]  . COLONOSCOPY WITH PROPOFOL N/A 05/20/2013   Dr.Rourk- inadequate prep, normal appearing rectum, grossly normal colon aside from pancolonic diverticula, normal terminal ileum bx= unremarkable  colonic mucosa. Due for early interval 2016.   Marland Kitchen COLONOSCOPY WITH PROPOFOL N/A 04/26/2014   ZTI:WPYKDX ileum/one colon polyp removed/moderate pan-colonic diverticulosis/large internal hemorrhoids  . COLONOSCOPY WITH PROPOFOL N/A 12/23/2014   Dr.Rourk- minimal internal hemorrhoids, pancolonic diverticulosis  . DEBRIDEMENT OF ABDOMINAL WALL ABSCESS N/A 02/08/2013   Procedure: DEBRIDEMENT OF ABDOMINAL WALL ABSCESS;  Surgeon: Jamesetta So, MD;  Location: [facility];  Service: General;  Laterality: N/A;  . ESOPHAGOGASTRODUODENOSCOPY (EGD) WITH PROPOFOL N/A 05/20/2013   Dr.Rourk- s/p prior gastric surgery with normal esophagus, residual gastric mucosa and patent efferent limb  . ESOPHAGOGASTRODUODENOSCOPY (EGD) WITH PROPOFOL N/A 02/03/2014   Dr. Gala Romney:  s/p hemigastrectomy with retained gastric contents. Residual gastric mucosa and efferent limb appeared normal otherwise. Query gastroparesis.   Marland Kitchen ESOPHAGOGASTRODUODENOSCOPY (EGD) WITH PROPOFOL N/A 04/26/2014   IPJ:[id number] ring/small HH/mild non-erosive gasrtitis/normal anastomosis  . ESOPHAGOGASTRODUODENOSCOPY (EGD) WITH PROPOFOL N/A 12/23/2014   Dr.Rourk- s/p prior hemigastrctomy, active oozing from anastomotic suture site, hemostasis achieved  . ESOPHAGOGASTRODUODENOSCOPY (EGD) WITH PROPOFOL N/A 05/23/2016   Dr. 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 tendon repar Right    wrist  . WOUND EXPLORATION Right 06/24/2013  Procedure: exploration of traumatic wound right wrist;  Surgeon: Tennis Must, MD;  Location: [place];  Service: Orthopedics;  Laterality: Right;    Prior to Admission medications   Medication Sig Start Date End Date Taking? Authorizing Provider  ibuprofen (ADVIL,MOTRIN) 600 MG tablet Take 1 tablet (600 mg total) by mouth 4 (four) times daily. 08/01/16  Yes Lily Kocher, PA-C  lisinopril (PRINIVIL,ZESTRIL) 10 MG tablet Take 10 mg by mouth daily.    Yes [provider]  ondansetron (ZOFRAN-ODT) 4 MG disintegrating tablet Take 1 tablet (4 mg total) by mouth every 8 (eight) hours as needed for nausea or vomiting. 07/14/16  Yes Davonna Belling, MD  pantoprazole (PROTONIX) 40 MG tablet Take 1 tablet (40 mg total) by mouth 2 (two) times daily before a meal. 09/04/14  Yes Daleen Bo, MD  promethazine (PHENERGAN) 12.5 MG suppository Place 1 suppository (12.5 mg total) rectally every 6 (six) hours as needed for nausea or vomiting. 08/08/16  Yes Annitta Needs, NP  cyclobenzaprine (FLEXERIL) 10 MG tablet Take 1 tablet (10 mg total) by mouth 2 (two) times daily as needed for muscle spasms. Patient not taking: Reported on 08/22/2016 08/01/16   Lily Kocher, PA-C  folic acid (FOLVITE) 1 MG tablet Take 1 tablet (1 mg total) by mouth daily. Patient not taking: Reported on 08/22/2016 10/08/15   Rosita Fire, MD  ondansetron (ZOFRAN) 4 MG tablet TAKE (1) TABLET BY MOUTH EVERY SIX HOURS. Patient not taking: Reported on 08/22/2016 05/17/16   Mahala Menghini, PA-C  polyethylene glycol [facility]) packet Take 17 g by mouth daily. Patient not taking: Reported on 08/22/2016 04/25/16   Evalee Jefferson, PA-C  polyethylene glycol-electrolytes (TRILYTE) 420 g solution Take 4,000 mLs by mouth as directed. Patient not taking: Reported on 08/22/2016 08/08/16   Daneil Dolin, MD    Allergies as of 08/22/2016 - Review Complete 08/22/2016  Allergen Reaction Noted  . Codeine Hives 02/06/2013  . Morphine and related Hives 02/06/2013  . Reglan [metoclopramide] Other (See Comments) 08/08/2016    Family History  Problem Relation Age of Onset  . Brain cancer Son   . Schizophrenia Son   . Cancer Son        brain  . Lung cancer Father   . Cancer Father        mets  . Drug abuse Mother   . Breast cancer Maternal Aunt   . Bipolar disorder Maternal Aunt   . Drug abuse Maternal Aunt   . Colon cancer Maternal Grandmother        late 48s, early 58s  . Drug abuse Sister   .  Drug abuse Brother   . Bipolar disorder Paternal Grandfather   . Bipolar disorder Cousin   . Liver disease Neg Hx     Social History   Social History  . Marital status: Legally Separated    Spouse name: N/A  . Number of children: N/A  . Years of education: N/A   Occupational History  . Not on file.   Social History Main Topics  . Smoking status: Current Every Day Smoker    Packs/day: 0.50    Years: 35.00    Types: Cigarettes  . Smokeless tobacco: Never Used  . Alcohol use 7.2 oz/week    12 Cans of beer per week     Comment: sometimes 3/ 12 oz beers a day  . Drug use: No     Comment: quit 09/2012  . Sexual activity: Yes  Birth control/ protection: Surgical   Other Topics Concern  . Not on file   Social History Narrative  . No narrative on file    Review of Systems: See HPI, otherwise negative ROS   Physical Exam: BP (!) 94/59   Pulse (!) 107   Temp 98.1 F (36.7 C)   Resp 20   Ht 5\' 3"  (1.6 m)   Wt 128 lb 12 oz (58.4 kg)   SpO2 99%   BMI 22.81 kg/m  General:   Alert,  pleasant and cooperative in NAD Head:  Normocephalic and atraumatic. Neck:  Supple; Lungs:  Clear throughout to auscultation.    Heart:  Regular rate and rhythm. Abdomen:  Soft, nontender and nondistended. Normal bowel sounds, without guarding, and without rebound.   Neurologic:  Alert and  oriented x4;  grossly normal neurologically.  Impression/Plan:    BRBPR  PLAN: TCS TODAY. DISCUSSED PROCEDURE, BENEFITS, & RISKS: < 1% chance of medication reaction, bleeding, perforation, or rupture of spleen/liver.

## 2016-08-23 NOTE — Anesthesia Procedure Notes (Signed)
Procedure Name: MAC Date/Time: 08/23/2016 8:41 AM Performed by: Vista Deck Pre-anesthesia Checklist: Patient identified, Emergency Drugs available, Suction available, Timeout performed and Patient being monitored Patient Re-evaluated:Patient Re-evaluated prior to inductionOxygen Delivery Method: Non-rebreather mask

## 2016-08-23 NOTE — Anesthesia Postprocedure Evaluation (Signed)
Anesthesia Post Note  Patient: Angel Price  Procedure(s) Performed: Procedure(s) (LRB): COLONOSCOPY WITH PROPOFOL (N/A)  Patient location during evaluation: ICU Anesthesia Type: MAC Level of consciousness: awake and alert Pain management: satisfactory to patient Vital Signs Assessment: post-procedure vital signs reviewed and stable Respiratory status: spontaneous breathing Cardiovascular status: stable Anesthetic complications: no     Last Vitals:  Vitals:   08/23/16 0955 08/23/16 1018  BP: 110/76   Pulse: 76   Resp: 17   Temp:  36.7 C    Last Pain:  Vitals:   08/23/16 1018  TempSrc: Oral  PainSc:                  Drucie Opitz

## 2016-08-23 NOTE — Anesthesia Preprocedure Evaluation (Signed)
Anesthesia Evaluation  Patient identified by MRN, date of birth, ID band Patient awake    Reviewed: Allergy & Precautions, H&P , NPO status , Patient's Chart, lab work & pertinent test results  History of Anesthesia Complications Negative for: history of anesthetic complications  Airway Mallampati: I  TM Distance: >3 FB     Dental  (+) Poor Dentition, Chipped, Missing, Dental Advisory Given,    Pulmonary Current Smoker,    breath sounds clear to auscultation       Cardiovascular hypertension, Pt. on medications  Rhythm:Regular Rate:Normal     Neuro/Psych  Headaches, PSYCHIATRIC DISORDERS ( History of schizoaffective disorder) Anxiety Depression Bipolar Disorder    GI/Hepatic hiatal hernia, neg GERD  ,(+)     substance abuse  alcohol use and cocaine use,   Endo/Other  Diabetes:  Hypothyroidism ( s/p  I131 Rx )   Renal/GU      Musculoskeletal   Abdominal   Peds  Hematology  (+) anemia ,   Anesthesia Other Findings Alcohol abuse   Reproductive/Obstetrics                             Anesthesia Physical Anesthesia Plan  ASA: III  Anesthesia Plan: MAC   Post-op Pain Management:    Induction: Intravenous  PONV Risk Score and Plan:   Airway Management Planned: Simple Face Mask  Additional Equipment:   Intra-op Plan:   Post-operative Plan:   Informed Consent: I have reviewed the patients History and Physical, chart, labs and discussed the procedure including the risks, benefits and alternatives for the proposed anesthesia with the patient or authorized representative who has indicated his/her understanding and acceptance.     Plan Discussed with:   Anesthesia Plan Comments:         Anesthesia Quick Evaluation

## 2016-08-23 NOTE — Progress Notes (Signed)
Cell phone, nose ring, ring and eyeglasses at bedside.

## 2016-08-24 LAB — CBC
HEMATOCRIT: 25.7 % — AB (ref 36.0–46.0)
HEMOGLOBIN: 8.8 g/dL — AB (ref 12.0–15.0)
MCH: 30 pg (ref 26.0–34.0)
MCHC: 34.2 g/dL (ref 30.0–36.0)
MCV: 87.7 fL (ref 78.0–100.0)
Platelets: 159 10*3/uL (ref 150–400)
RBC: 2.93 MIL/uL — AB (ref 3.87–5.11)
RDW: 16.5 % — ABNORMAL HIGH (ref 11.5–15.5)
WBC: 5.7 10*3/uL (ref 4.0–10.5)

## 2016-08-24 MED ORDER — SODIUM CHLORIDE 0.9 % IV SOLN
1000.0000 mL | INTRAVENOUS | Status: DC
  Administered 2016-08-24 – 2016-08-25 (×2): 1000 mL via INTRAVENOUS

## 2016-08-24 MED ORDER — SULFAMETHOXAZOLE-TRIMETHOPRIM 800-160 MG PO TABS
0.5000 | ORAL_TABLET | Freq: Two times a day (BID) | ORAL | Status: DC
  Administered 2016-08-24 – 2016-08-25 (×3): 0.5 via ORAL
  Filled 2016-08-24 (×3): qty 1

## 2016-08-24 NOTE — Progress Notes (Signed)
Subjective: This is a 51 years old female with history of multiple medical illnesses including ETOH abuse was admitted due to rectal bleed. She was evaluated by GI and was found to be bleed from hemorrhoids. pATIENT CLAIMS SHE IS WEAK AND DIZZY.  Objective: Vital signs in last 24 hours: Temp:  [97.8 F (36.6 C)-98.2 F (36.8 C)] 98.1 F (36.7 C) (06/09 0739) Pulse Rate:  [74-109] 85 (06/09 0739) Resp:  [14-54] 16 (06/09 0739) BP: (94-145)/(59-102) 122/93 (06/09 0600) SpO2:  [90 %-100 %] 98 % (06/09 0739) Weight:  [57 kg (125 lb 10.6 oz)] 57 kg (125 lb 10.6 oz) (06/09 0400) Weight change: -1.4 kg (-3 lb 1.4 oz) Last BM Date: 08/23/16  Intake/Output from previous day: 06/08 0701 - 06/09 0700 In: 520 [P.O.:120; I.V.:400] Out: 0   PHYSICAL EXAM General appearance: alert, fatigued and no distress Resp: clear to auscultation bilaterally Cardio: S1, S2 normal GI: soft, non-tender; bowel sounds normal; no masses,  no organomegaly Extremities: extremities normal, atraumatic, no cyanosis or edema  Lab Results:  Results for orders placed or performed during the hospital encounter of 08/22/16 (from the past 48 hour(s))  Comprehensive metabolic panel     Status: Abnormal   Collection Time: 08/22/16  9:15 AM  Result Value Ref Range   Sodium 130 (L) 135 - 145 mmol/L   Potassium 3.9 3.5 - 5.1 mmol/L   Chloride 97 (L) 101 - 111 mmol/L   CO2 20 (L) 22 - 32 mmol/L   Glucose, Bld 70 65 - 99 mg/dL   BUN <5 (L) 6 - 20 mg/dL   Creatinine, Ser 0.45 0.44 - 1.00 mg/dL   Calcium 8.7 (L) 8.9 - 10.3 mg/dL   Total Protein 6.9 6.5 - 8.1 g/dL   Albumin 3.2 (L) 3.5 - 5.0 g/dL   AST 172 (H) 15 - 41 U/L   ALT 56 (H) 14 - 54 U/L   Alkaline Phosphatase 130 (H) 38 - 126 U/L   Total Bilirubin 1.0 0.3 - 1.2 mg/dL   GFR calc non Af Amer >60 >60 mL/min   GFR calc Af Amer >60 >60 mL/min    Comment: (NOTE) The eGFR has been calculated using the CKD EPI equation. This calculation has not been validated in  all clinical situations. eGFR's persistently <60 mL/min signify possible Chronic Kidney Disease.    Anion gap 13 5 - 15  CBC     Status: Abnormal   Collection Time: 08/22/16  9:15 AM  Result Value Ref Range   WBC 5.0 4.0 - 10.5 K/uL   RBC 3.56 (L) 3.87 - 5.11 MIL/uL   Hemoglobin 10.5 (L) 12.0 - 15.0 g/dL   HCT 30.6 (L) 36.0 - 46.0 %   MCV 86.0 78.0 - 100.0 fL   MCH 29.5 26.0 - 34.0 pg   MCHC 34.3 30.0 - 36.0 g/dL   RDW 16.7 (H) 11.5 - 15.5 %   Platelets 192 150 - 400 K/uL  Protime-INR     Status: None   Collection Time: 08/22/16  9:15 AM  Result Value Ref Range   Prothrombin Time 13.0 11.4 - 15.2 seconds   INR 0.98   Type and screen [facility]     Status: None   Collection Time: 08/22/16  9:16 AM  Result Value Ref Range   ABO/RH(D) O POS    Antibody Screen NEG    Sample Expiration 08/25/2016   Lipase, blood     Status: None   Collection Time: 08/22/16  9:16 AM  Result Value Ref Range   Lipase 22 11 - 51 U/L  Ethanol     Status: Abnormal   Collection Time: 08/22/16  9:16 AM  Result Value Ref Range   Alcohol, Ethyl (B) 22 (H) <5 mg/dL    Comment:        LOWEST DETECTABLE LIMIT FOR SERUM ALCOHOL IS 5 mg/dL FOR MEDICAL PURPOSES ONLY   TSH     Status: Abnormal   Collection Time: 08/22/16  4:46 PM  Result Value Ref Range   TSH 5.378 (H) 0.350 - 4.500 uIU/mL    Comment: Performed by a 3rd Generation assay with a functional sensitivity of <=0.01 uIU/mL.  CBC     Status: Abnormal   Collection Time: 08/22/16  4:46 PM  Result Value Ref Range   WBC 4.7 4.0 - 10.5 K/uL   RBC 3.36 (L) 3.87 - 5.11 MIL/uL   Hemoglobin 9.9 (L) 12.0 - 15.0 g/dL   HCT 29.0 (L) 36.0 - 46.0 %   MCV 86.3 78.0 - 100.0 fL   MCH 29.5 26.0 - 34.0 pg   MCHC 34.1 30.0 - 36.0 g/dL   RDW 16.7 (H) 11.5 - 15.5 %   Platelets 179 150 - 400 K/uL  MRSA PCR Screening     Status: None   Collection Time: 08/22/16  5:00 PM  Result Value Ref Range   MRSA by PCR NEGATIVE NEGATIVE    Comment:        The  GeneXpert MRSA Assay (FDA approved for NASAL specimens only), is one component of a comprehensive MRSA colonization surveillance program. It is not intended to diagnose MRSA infection nor to guide or monitor treatment for MRSA infections.   Urinalysis, Routine w reflex microscopic     Status: Abnormal   Collection Time: 08/22/16  7:50 PM  Result Value Ref Range   Color, Urine YELLOW YELLOW   APPearance HAZY (A) CLEAR   Specific Gravity, Urine 1.010 1.005 - 1.030   pH 5.0 5.0 - 8.0   Glucose, UA NEGATIVE NEGATIVE mg/dL   Hgb urine dipstick SMALL (A) NEGATIVE   Bilirubin Urine NEGATIVE NEGATIVE   Ketones, ur 20 (A) NEGATIVE mg/dL   Protein, ur NEGATIVE NEGATIVE mg/dL   Nitrite NEGATIVE NEGATIVE   Leukocytes, UA LARGE (A) NEGATIVE   RBC / HPF 6-30 0 - 5 RBC/hpf   WBC, UA 6-30 0 - 5 WBC/hpf   Bacteria, UA RARE (A) NONE SEEN   Squamous Epithelial / LPF 0-5 (A) NONE SEEN  CBC     Status: Abnormal   Collection Time: 08/23/16 12:39 AM  Result Value Ref Range   WBC 4.3 4.0 - 10.5 K/uL   RBC 3.47 (L) 3.87 - 5.11 MIL/uL   Hemoglobin 10.3 (L) 12.0 - 15.0 g/dL   HCT 30.5 (L) 36.0 - 46.0 %   MCV 87.9 78.0 - 100.0 fL   MCH 29.7 26.0 - 34.0 pg   MCHC 33.8 30.0 - 36.0 g/dL   RDW 16.7 (H) 11.5 - 15.5 %   Platelets 171 150 - 400 K/uL  CBC     Status: Abnormal   Collection Time: 08/23/16 10:29 AM  Result Value Ref Range   WBC 4.5 4.0 - 10.5 K/uL   RBC 3.28 (L) 3.87 - 5.11 MIL/uL   Hemoglobin 9.8 (L) 12.0 - 15.0 g/dL   HCT 28.8 (L) 36.0 - 46.0 %   MCV 87.8 78.0 - 100.0 fL   MCH 29.9 26.0 - 34.0 pg  MCHC 34.0 30.0 - 36.0 g/dL   RDW 16.7 (H) 11.5 - 15.5 %   Platelets 168 150 - 400 K/uL  CBC     Status: Abnormal   Collection Time: 08/23/16  4:09 PM  Result Value Ref Range   WBC 4.7 4.0 - 10.5 K/uL   RBC 2.88 (L) 3.87 - 5.11 MIL/uL   Hemoglobin 8.6 (L) 12.0 - 15.0 g/dL   HCT 25.4 (L) 36.0 - 46.0 %   MCV 88.2 78.0 - 100.0 fL   MCH 29.9 26.0 - 34.0 pg   MCHC 33.9 30.0 - 36.0 g/dL    RDW 16.6 (H) 11.5 - 15.5 %   Platelets 154 150 - 400 K/uL  CBC     Status: Abnormal   Collection Time: 08/24/16 12:47 AM  Result Value Ref Range   WBC 5.7 4.0 - 10.5 K/uL   RBC 2.93 (L) 3.87 - 5.11 MIL/uL   Hemoglobin 8.8 (L) 12.0 - 15.0 g/dL   HCT 25.7 (L) 36.0 - 46.0 %   MCV 87.7 78.0 - 100.0 fL   MCH 30.0 26.0 - 34.0 pg   MCHC 34.2 30.0 - 36.0 g/dL   RDW 16.5 (H) 11.5 - 15.5 %   Platelets 159 150 - 400 K/uL    ABGS No results for input(s): PHART, PO2ART, TCO2, HCO3 in the last 72 hours.  Invalid input(s): PCO2 CULTURES Recent Results (from the past 240 hour(s))  MRSA PCR Screening     Status: None   Collection Time: 08/22/16  5:00 PM  Result Value Ref Range Status   MRSA by PCR NEGATIVE NEGATIVE Final    Comment:        The GeneXpert MRSA Assay (FDA approved for NASAL specimens only), is one component of a comprehensive MRSA colonization surveillance program. It is not intended to diagnose MRSA infection nor to guide or monitor treatment for MRSA infections.    Studies/Results: Dg Abdomen Acute W/chest  Result Date: 08/22/2016 CLINICAL DATA:  Recent rectal bleeding EXAM: DG ABDOMEN ACUTE W/ 1V CHEST COMPARISON:  Chest radiograph May 22, 2016; CT abdomen and pelvis May 22, 2016 FINDINGS: PA chest: Lungs are clear. Heart size and pulmonary vascularity are normal. No adenopathy. Supine and upright abdomen: There is moderate contrast in the colon. There is no bowel dilatation or air-fluid level to suggest bowel obstruction. No free air. There are multiple surgical clips in the upper abdomen. IMPRESSION: No bowel dilatation or air-fluid level to suggest bowel obstruction. No free air. Moderate contrast is seen in the colon. Extensive postoperative change present in the abdomen. Lungs clear. Electronically Signed   By: Lowella Grip III M.D.   On: 08/22/2016 08:56    Medications: I have reviewed the patient's current medications.  Assesment:  Principal Problem:    Bright red rectal bleeding Active Problems:   History of schizoaffective disorder   Tobacco abuse   History of gastric bypass   Essential hypertension, benign   Rectal bleeding   MDD (major depressive disorder), recurrent episode, severe (HCC)   Abdominal pain   Schatzki's ring   Thyroid disease     uti   Plan:  MEDICATIONS REVIEWED WILL MONITOR cbc WILL START ON ORAL ANTIBIOTIC FOR UTI DT PRECAUTION    LOS: 2 days   Angel Price 08/24/2016, 8:04 AM

## 2016-08-24 NOTE — Progress Notes (Signed)
Patient ID: Angel Price, female   DOB: Oct 12, 1965, 51 y.o.   MRN: [id number]  Assessment/Plan: ADMITTED WITH BRBPR DUE TO HEMORRHOIDS AND ABDOMINAL PAIN DUE TO UTI/CHRONIC ABDOMINAL PAIN. PMHx: ETOH ABUSE. HB TRENDING DOWN.  PLAN: 1. ANUSOL CREAM QID. 2. SUPPORTIVE CARE. CONSULT SURGERY IF RECTAL BLEEDING CONTINUES.   Subjective: Since I last evaluated the patient SHE IS STILL C/O ABDOMINAL PAIN AND BRBPR. BEING  TREATED FOR UTI.  Objective: Vital signs in last 24 hours: Vitals:   08/24/16 0840 08/24/16 0900  BP: 110/90 (!) 112/94  Pulse: 95 90  Resp: 19 (!) 22  Temp:       General appearance: alert, cooperative and no distress Resp: clear to auscultation bilaterally Cardio: regular rate and rhythm GI: soft, non-tender; bowel sounds normal; no masses,  no organomegaly  Lab Results:  WBC 5.7 Hb 8.8 PLT CT 159 URINE CX PENDING   Studies/Results: No results found.  Medications: I have reviewed the patient's current medications.   LOS: 5 days   [name] 08/26/2013, 2:23 PM

## 2016-08-25 ENCOUNTER — Telehealth: Payer: Self-pay | Admitting: Gastroenterology

## 2016-08-25 LAB — CBC
HCT: 27 % — ABNORMAL LOW (ref 36.0–46.0)
Hemoglobin: 9.1 g/dL — ABNORMAL LOW (ref 12.0–15.0)
MCH: 29.8 pg (ref 26.0–34.0)
MCHC: 33.7 g/dL (ref 30.0–36.0)
MCV: 88.5 fL (ref 78.0–100.0)
Platelets: 174 10*3/uL (ref 150–400)
RBC: 3.05 MIL/uL — AB (ref 3.87–5.11)
RDW: 17 % — ABNORMAL HIGH (ref 11.5–15.5)
WBC: 5.3 10*3/uL (ref 4.0–10.5)

## 2016-08-25 LAB — BASIC METABOLIC PANEL
Anion gap: 7 (ref 5–15)
CO2: 25 mmol/L (ref 22–32)
Calcium: 8.3 mg/dL — ABNORMAL LOW (ref 8.9–10.3)
Chloride: 107 mmol/L (ref 101–111)
Creatinine, Ser: 0.43 mg/dL — ABNORMAL LOW (ref 0.44–1.00)
GFR calc Af Amer: >60 mL/min (ref >60)
GLUCOSE: 89 mg/dL (ref 65–99)
POTASSIUM: 3 mmol/L — AB (ref 3.5–5.1)
Sodium: 139 mmol/L (ref 135–145)

## 2016-08-25 LAB — HEPATITIS C ANTIBODY: HCV Ab: 0.1 s/co ratio (ref 0.0–0.9)

## 2016-08-25 MED ORDER — ADULT MULTIVITAMIN W/MINERALS CH: 1.0000 | ORAL_TABLET | Freq: Every day | ORAL | 3 refills | Status: DC

## 2016-08-25 MED ORDER — THIAMINE HCL 100 MG PO TABS: 100.0000 mg | ORAL_TABLET | Freq: Every day | ORAL | 3 refills | Status: DC

## 2016-08-25 MED ORDER — POTASSIUM CHLORIDE CRYS ER 20 MEQ PO TBCR: 40.0000 meq | EXTENDED_RELEASE_TABLET | Freq: Two times a day (BID) | ORAL | 0 refills | Status: DC

## 2016-08-25 MED ORDER — POTASSIUM CHLORIDE CRYS ER 20 MEQ PO TBCR
40.0000 meq | EXTENDED_RELEASE_TABLET | Freq: Once | ORAL | Status: AC
  Administered 2016-08-25: 40 meq via ORAL
  Filled 2016-08-25: qty 2

## 2016-08-25 NOTE — Discharge Summary (Signed)
Physician Discharge Summary  Patient ID: Angel Price MRN: [id number] DOB/AGE: 49-Mar-1967 49 y.o. Primary Care Physician:Emilyn Ruble, MD Admit date: 08/22/2016 Discharge date: 08/25/2016    Discharge Diagnoses:   Principal Problem:   Bright red rectal bleeding Active Problems:   History of schizoaffective disorder   Tobacco abuse   History of gastric bypass   Essential hypertension, benign   Rectal bleeding   MDD (major depressive disorder), recurrent episode, severe (HCC)   Abdominal pain   Schatzki's ring   Thyroid disease    Hemorrhoids   Allergies as of 08/25/2016      Reactions   Codeine Hives   Morphine And Related Hives   Reglan [metoclopramide] Other (See Comments)   EPS symptoms       Medication List    TAKE these medications   cyclobenzaprine 10 MG tablet Commonly known as:  FLEXERIL Take 1 tablet (10 mg total) by mouth 2 (two) times daily as needed for muscle spasms.   folic acid 1 MG tablet Commonly known as:  FOLVITE Take 1 tablet (1 mg total) by mouth daily.   ibuprofen 600 MG tablet Commonly known as:  ADVIL,MOTRIN Take 1 tablet (600 mg total) by mouth 4 (four) times daily.   lisinopril 10 MG tablet Commonly known as:  PRINIVIL,ZESTRIL Take 10 mg by mouth daily.   multivitamin with minerals Tabs tablet Take 1 tablet by mouth daily. Start taking on:  08/26/2016   ondansetron 4 MG disintegrating tablet Commonly known as:  ZOFRAN-ODT Take 1 tablet (4 mg total) by mouth every 8 (eight) hours as needed for nausea or vomiting.   ondansetron 4 MG tablet Commonly known as:  ZOFRAN TAKE (1) TABLET BY MOUTH EVERY SIX HOURS.   pantoprazole 40 MG tablet Commonly known as:  PROTONIX Take 1 tablet (40 mg total) by mouth 2 (two) times daily before a meal.   polyethylene glycol packet Commonly known as:  MIRALAX Take 17 g by mouth daily.   polyethylene glycol-electrolytes 420 g solution Commonly known as:  TRILYTE Take 4,000 mLs by mouth as  directed.   potassium chloride SA 20 MEQ tablet Commonly known as:  K-DUR,KLOR-CON Take 2 tablets (40 mEq total) by mouth 2 (two) times daily.   promethazine 12.5 MG suppository Commonly known as:  PHENERGAN Place 1 suppository (12.5 mg total) rectally every 6 (six) hours as needed for nausea or vomiting.   thiamine 100 MG tablet Take 1 tablet (100 mg total) by mouth daily. Start taking on:  08/26/2016       Discharged Condition: improved    Consults: GI  Significant Diagnostic Studies: Dg Forearm Right  Result Date: 08/01/2016 CLINICAL DATA:  Pain in forearm and wrist after fall today. EXAM: RIGHT FOREARM - 2 VIEW COMPARISON:  None. FINDINGS: No acute fracture or dislocation. No elbow joint effusion. No definite soft tissue swelling. IMPRESSION: No acute osseous abnormality. Electronically Signed   By: Abigail Miyamoto M.D.   On: 08/01/2016 15:10   Dg Abdomen Acute W/chest  Result Date: 08/22/2016 CLINICAL DATA:  Recent rectal bleeding EXAM: DG ABDOMEN ACUTE W/ 1V CHEST COMPARISON:  Chest radiograph May 22, 2016; CT abdomen and pelvis May 22, 2016 FINDINGS: PA chest: Lungs are clear. Heart size and pulmonary vascularity are normal. No adenopathy. Supine and upright abdomen: There is moderate contrast in the colon. There is no bowel dilatation or air-fluid level to suggest bowel obstruction. No free air. There are multiple surgical clips in the upper abdomen. IMPRESSION: No  bowel dilatation or air-fluid level to suggest bowel obstruction. No free air. Moderate contrast is seen in the colon. Extensive postoperative change present in the abdomen. Lungs clear. Electronically Signed   By: Lowella Grip III M.D.   On: 08/22/2016 08:56   Dg Ugi W/high Density W/kub  Result Date: 08/19/2016 CLINICAL DATA:  Non intractable vomiting with nausea, history of Billroth II for ulcer disease, evaluate anastomosis, history lupus, hypertension, gastritis EXAM: UPPER GI SERIES WITH KUB TECHNIQUE:  After obtaining a scout radiograph a routine upper GI series was performed using thin barium. Patient swallowed effervescent granules in anticipation of performing a double contrast upper GI exam but experienced significant nausea and vomiting and was unable to retain in the air. FLUOROSCOPY TIME:  Fluoroscopy Time:  2 minutes 24 seconds Radiation Exposure Index (if provided by the fluoroscopic device): 39.8 mGy Number of Acquired Spot Images: 2 plus multiple screen captures during fluoroscopy COMPARISON:  CT abdomen and pelvis 05/22/2016 FINDINGS: Surgical clips in RIGHT upper quadrant from cholecystectomy. Additional surgical clips in upper abdomen and in LEFT mid abdomen from prior Billroth 2 procedure. Additional endoscopic biopsy clips project over the stomach. Bones appear demineralized. Nonobstructive bowel gas pattern. Normal esophageal distention and motility. No esophageal mass or stricture Smooth appearance of esophageal mucosa without irregularity or ulceration. Proximal to mid stomach distends normally with normal rugal fold pattern. No definite mucosal thickening or mass. Patent gastrojejunostomy anastomosis with free passage of contrast into jejunum. No anastomotic stricture or ulceration. Visualize jejunal loops unremarkable. IMPRESSION: Post Billroth 2 with patent gastrojejunostomy anastomosis. No acute abnormalities. Electronically Signed   By: Lavonia Dana M.D.   On: 08/19/2016 09:55    Lab Results: Basic Metabolic Panel:  Recent Labs  08/25/16 0447  NA 139  K 3.0*  CL 107  CO2 25  GLUCOSE 89  BUN <5*  CREATININE 0.43*  CALCIUM 8.3*   Liver Function Tests: No results for input(s): AST, ALT, ALKPHOS, BILITOT, PROT, ALBUMIN in the last 72 hours.   CBC:  Recent Labs  08/24/16 0047 08/25/16 0447  WBC 5.7 5.3  HGB 8.8* 9.1*  HCT 25.7* 27.0*  MCV 87.7 88.5  PLT 159 174    Recent Results (from the past 240 hour(s))  MRSA PCR Screening     Status: None   Collection  Time: 08/22/16  5:00 PM  Result Value Ref Range Status   MRSA by PCR NEGATIVE NEGATIVE Final    Comment:        The GeneXpert MRSA Assay (FDA approved for NASAL specimens only), is one component of a comprehensive MRSA colonization surveillance program. It is not intended to diagnose MRSA infection nor to guide or monitor treatment for MRSA infections.      Hospital Course:   This isa 51 years old female who admitted due to rectal bleeding. Patient was evaluated by GI and was found that the bleeding was due to hemorrhoids. She remained stable. No significant drop in her H/H. Patient discharged in stable condition to be followed in out patient.  Discharge Exam: Blood pressure 91/74, pulse 79, temperature 98.4 F (36.9 C), temperature source Oral, resp. rate 16, height 5\' 3"  (1.6 m), weight 66.2 kg (145 lb 15.1 oz), SpO2 100 %.    Disposition:  home      Signed: Nicola Heinemann   08/25/2016, 10:13 AM

## 2016-08-25 NOTE — Telephone Encounter (Addendum)
REFER TO DR. Arnoldo Morale WITHIN 2 WEEKS, Dx: RECTAL BLEEDING , EVALUATE FOR HEMORRHOIDECTOMY. CANCEL TCS JUL 9. OPV W/ RMR IN 2-3 MOS.

## 2016-08-26 ENCOUNTER — Other Ambulatory Visit: Payer: Self-pay

## 2016-08-26 ENCOUNTER — Telehealth: Payer: Self-pay | Admitting: Gastroenterology

## 2016-08-26 DIAGNOSIS — K625 Hemorrhage of anus and rectum: Secondary | ICD-10-CM

## 2016-08-26 LAB — URINE CULTURE: Culture: NO GROWTH

## 2016-08-26 NOTE — Telephone Encounter (Signed)
Please arrange follow-up in 3 months.

## 2016-08-26 NOTE — Telephone Encounter (Signed)
ON RECALL  °

## 2016-08-26 NOTE — Progress Notes (Signed)
C/O abd pain. Fentanyl 50 mcg IV given. Rates pain 9.5.  Fentanyl 50 mcg wasted in sink.

## 2016-08-26 NOTE — Telephone Encounter (Signed)
ON RECALL FOR 3 MONTH OV WITH RMR

## 2016-08-26 NOTE — Telephone Encounter (Signed)
Kelly at Dr. Arnoldo Morale office called and she was unable to contact pt by phone to inform her of appt 09/12/16 at 11:00am (she needs to arrive at 10:45am). Tried to call pt and also unable to reach her. Letter mailed to pt to inform her of appt with Dr. Arnoldo Morale.

## 2016-08-26 NOTE — Telephone Encounter (Signed)
Referral sent to Dr. Arnoldo Morale via [facility]. Tried to call Endo scheduler, LMOVM to cancel TCS 09/23/16.

## 2016-08-28 ENCOUNTER — Encounter (HOSPITAL_COMMUNITY): Payer: Self-pay | Admitting: Gastroenterology

## 2016-09-12 ENCOUNTER — Telehealth: Payer: Self-pay

## 2016-09-12 ENCOUNTER — Ambulatory Visit: Payer: Medicare HMO | Admitting: General Surgery

## 2016-09-12 NOTE — Telephone Encounter (Signed)
REVIEWED-NO ADDITIONAL RECOMMENDATIONS. 

## 2016-09-12 NOTE — Telephone Encounter (Signed)
Pt canceled her appointment with Dr.Jenkins today because she did not have the co-payment

## 2016-09-16 ENCOUNTER — Other Ambulatory Visit: Payer: Self-pay

## 2016-09-17 ENCOUNTER — Encounter (HOSPITAL_COMMUNITY): Payer: Self-pay | Admitting: *Deleted

## 2016-09-17 ENCOUNTER — Emergency Department (HOSPITAL_COMMUNITY)
Admission: EM | Admit: 2016-09-17 | Discharge: 2016-09-17 | Disposition: A | Discharge status: Home / Self Care | Payer: Medicare HMO | Attending: Emergency Medicine | Admitting: Emergency Medicine

## 2016-09-17 ENCOUNTER — Emergency Department (HOSPITAL_COMMUNITY): Payer: Medicare HMO

## 2016-09-17 DIAGNOSIS — R1010 Upper abdominal pain, unspecified: Secondary | ICD-10-CM | POA: Diagnosis not present

## 2016-09-17 DIAGNOSIS — I10 Essential (primary) hypertension: Secondary | ICD-10-CM | POA: Diagnosis not present

## 2016-09-17 DIAGNOSIS — F17210 Nicotine dependence, cigarettes, uncomplicated: Secondary | ICD-10-CM | POA: Diagnosis not present

## 2016-09-17 DIAGNOSIS — Z79899 Other long term (current) drug therapy: Secondary | ICD-10-CM | POA: Insufficient documentation

## 2016-09-17 DIAGNOSIS — K625 Hemorrhage of anus and rectum: Secondary | ICD-10-CM | POA: Insufficient documentation

## 2016-09-17 DIAGNOSIS — Z86010 Personal history of colonic polyps: Secondary | ICD-10-CM | POA: Diagnosis not present

## 2016-09-17 DIAGNOSIS — Z791 Long term (current) use of non-steroidal anti-inflammatories (NSAID): Secondary | ICD-10-CM | POA: Diagnosis not present

## 2016-09-17 DIAGNOSIS — E039 Hypothyroidism, unspecified: Secondary | ICD-10-CM | POA: Diagnosis not present

## 2016-09-17 DIAGNOSIS — R69 Illness, unspecified: Secondary | ICD-10-CM | POA: Diagnosis not present

## 2016-09-17 LAB — TYPE AND SCREEN
ABO/RH(D): O POS
ANTIBODY SCREEN: NEGATIVE

## 2016-09-17 LAB — COMPREHENSIVE METABOLIC PANEL
ALBUMIN: 3.6 g/dL (ref 3.5–5.0)
ALK PHOS: 205 U/L — AB (ref 38–126)
ALT: 83 U/L — ABNORMAL HIGH (ref 14–54)
AST: 286 U/L — AB (ref 15–41)
Anion gap: 16 — ABNORMAL HIGH (ref 5–15)
BILIRUBIN TOTAL: 1.1 mg/dL (ref 0.3–1.2)
BUN: 5 mg/dL — AB (ref 6–20)
CALCIUM: 9 mg/dL (ref 8.9–10.3)
CO2: 20 mmol/L — ABNORMAL LOW (ref 22–32)
Chloride: 95 mmol/L — ABNORMAL LOW (ref 101–111)
Creatinine, Ser: 0.56 mg/dL (ref 0.44–1.00)
GFR calc Af Amer: >60 mL/min (ref >60)
GFR calc non Af Amer: >60 mL/min (ref >60)
GLUCOSE: 90 mg/dL (ref 65–99)
POTASSIUM: 4.4 mmol/L (ref 3.5–5.1)
Sodium: 131 mmol/L — ABNORMAL LOW (ref 135–145)
TOTAL PROTEIN: 8.1 g/dL (ref 6.5–8.1)

## 2016-09-17 LAB — CBC
HEMATOCRIT: 32.4 % — AB (ref 36.0–46.0)
Hemoglobin: 11.4 g/dL — ABNORMAL LOW (ref 12.0–15.0)
MCH: 29.9 pg (ref 26.0–34.0)
MCHC: 35.2 g/dL (ref 30.0–36.0)
MCV: 85 fL (ref 78.0–100.0)
Platelets: 176 10*3/uL (ref 150–400)
RBC: 3.81 MIL/uL — ABNORMAL LOW (ref 3.87–5.11)
RDW: 16.7 % — AB (ref 11.5–15.5)
WBC: 6.1 10*3/uL (ref 4.0–10.5)

## 2016-09-17 LAB — I-STAT CHEM 8, ED
CHLORIDE: 98 mmol/L — AB (ref 101–111)
CREATININE: 0.7 mg/dL (ref 0.44–1.00)
Calcium, Ion: 1.13 mmol/L — ABNORMAL LOW (ref 1.15–1.40)
Glucose, Bld: 88 mg/dL (ref 65–99)
HEMATOCRIT: 37 % (ref 36.0–46.0)
Hemoglobin: 12.6 g/dL (ref 12.0–15.0)
Potassium: 4.4 mmol/L (ref 3.5–5.1)
Sodium: 132 mmol/L — ABNORMAL LOW (ref 135–145)
TCO2: 22 mmol/L (ref 0–100)

## 2016-09-17 LAB — POC OCCULT BLOOD, ED: Fecal Occult Bld: POSITIVE — AB

## 2016-09-17 MED ORDER — ONDANSETRON HCL 4 MG/2ML IJ SOLN
INTRAMUSCULAR | Status: AC
  Filled 2016-09-17: qty 2

## 2016-09-17 MED ORDER — PANTOPRAZOLE SODIUM 40 MG IV SOLR
40.0000 mg | Freq: Once | INTRAVENOUS | Status: AC
  Administered 2016-09-17: 40 mg via INTRAVENOUS
  Filled 2016-09-17: qty 40

## 2016-09-17 MED ORDER — SUCRALFATE 1 G PO TABS: 1.0000 g | ORAL_TABLET | Freq: Three times a day (TID) | ORAL | 1 refills | Status: DC

## 2016-09-17 MED ORDER — ONDANSETRON 4 MG PO TBDP: ORAL_TABLET | ORAL | 0 refills | Status: DC

## 2016-09-17 MED ORDER — PROMETHAZINE HCL 25 MG/ML IJ SOLN
INTRAMUSCULAR | Status: AC
  Filled 2016-09-17: qty 1

## 2016-09-17 MED ORDER — HYDROMORPHONE HCL 1 MG/ML IJ SOLN
0.5000 mg | Freq: Once | INTRAMUSCULAR | Status: AC
  Administered 2016-09-17: 0.5 mg via INTRAVENOUS
  Filled 2016-09-17: qty 1

## 2016-09-17 MED ORDER — HYDROCODONE-ACETAMINOPHEN 5-325 MG PO TABS: 1.0000 | ORAL_TABLET | Freq: Four times a day (QID) | ORAL | 0 refills | Status: DC | PRN

## 2016-09-17 MED ORDER — ONDANSETRON HCL 4 MG/2ML IJ SOLN
4.0000 mg | Freq: Once | INTRAMUSCULAR | Status: AC
Start: 2016-09-17 — End: 2016-09-17
  Administered 2016-09-17: 4 mg via INTRAVENOUS

## 2016-09-17 MED ORDER — SODIUM CHLORIDE 0.9 % IV BOLUS (SEPSIS)
1000.0000 mL | Freq: Once | INTRAVENOUS | Status: AC
  Administered 2016-09-17: 1000 mL via INTRAVENOUS

## 2016-09-17 MED ORDER — ONDANSETRON HCL 4 MG/2ML IJ SOLN: 4.0000 mg | Freq: Once | INTRAMUSCULAR | Status: DC

## 2016-09-17 MED ORDER — ONDANSETRON 4 MG PO TBDP: 4.0000 mg | ORAL_TABLET | Freq: Three times a day (TID) | ORAL | 1 refills | Status: DC | PRN

## 2016-09-17 MED ORDER — PROMETHAZINE HCL 25 MG/ML IJ SOLN
12.5000 mg | Freq: Once | INTRAMUSCULAR | Status: AC
  Administered 2016-09-17: 12.5 mg via INTRAVENOUS
  Filled 2016-09-17: qty 1

## 2016-09-17 MED ORDER — PROMETHAZINE HCL 25 MG/ML IJ SOLN
12.5000 mg | Freq: Once | INTRAMUSCULAR | Status: AC
  Administered 2016-09-17: 12.5 mg via INTRAVENOUS

## 2016-09-17 MED ORDER — PROMETHAZINE HCL 12.5 MG RE SUPP: 12.5000 mg | Freq: Four times a day (QID) | RECTAL | 1 refills | Status: DC | PRN

## 2016-09-17 NOTE — ED Provider Notes (Signed)
[facility] DEPT Provider Note   CSN: [id number] Arrival date & time: 09/17/16  1629     History   Chief Complaint Chief Complaint  Patient presents with  . Rectal Bleeding    HPI Angel Price is a 51 y.o. female.  Patient complains of epigastric abdominal pain and rectal bleeding. The bleeding is bright red blood. Patient has had a colonoscopy that showed bleeding hemorrhoids.   The history is provided by the patient. No language interpreter was used.  Rectal Bleeding  Quality:  Bright red Amount:  Moderate Timing:  Constant Chronicity:  Recurrent Context: not anal fissures   Similar prior episodes: yes   Relieved by:  Nothing Worsened by:  Nothing Ineffective treatments:  None tried Associated symptoms: abdominal pain     Past Medical History:  Diagnosis Date  . Abscess    soft tissue  . Adrenal mass ([place])   . Alcohol abuse   . Anxiety   . Blood transfusion without reported diagnosis   . Chronic abdominal pain   . Chronic wound infection of abdomen   . Colon polyp    colonoscopy 04/2014  . Depression   . Diverticulosis    colonoscopy 04/2014 moderat pan colonic  . Gastritis    EGD 05/2014  . Gastroparesis Nov 2015  . GERD (gastroesophageal reflux disease)   . Hemorrhoid    internal large  . Hiatal hernia   . History of Billroth II operation   . Hypertension   . Lung nodule    CT 02/2014 needs repeat 1 month  . Lung nodule < 6cm on CT 04/25/2014  . Lupus   . Nausea and vomiting    chronic, recurrent  . Schatzki's ring    patent per EGD 04/2014  . Sickle cell trait ([place])   . Suicide attempt ([place])   . 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 Bright red rectal bleeding 08/22/2016  . Hypotension due to blood loss   . Acute GI bleeding 05/24/2016  . Dieulafoy lesion of duodenum   . History of Billroth II operation   . Gastrointestinal hemorrhage 05/22/2016  . Absolute anemia   .  Pancreatitis, acute   . Acute pancreatitis 10/01/2015  . Abnormal CT scan of lung 10/01/2015  . Alcohol intoxication ([place])   . Left-sided weakness   . Psychosomatic factor in physical condition   . Upper GI bleed   . Diverticulosis of colon with hemorrhage   . Chronic wound infection of abdomen 12/22/2014  . Thyroid disease 12/22/2014  . Hypertension 12/22/2014  . Ataxia 11/01/2014  . Hemorrhoids 04/26/2014  . Lung nodule 04/26/2014  . Diverticulosis   . Gastritis   . Hiatal hernia   . Schatzki's ring   . Acute blood loss anemia 04/25/2014  . Sinus tachycardia 04/25/2014  . Hypokalemia 04/25/2014  . Hyponatremia 04/25/2014  . Hematemesis with nausea 04/25/2014  . Lung nodule < 6cm on CT 04/25/2014  . Intractable nausea and vomiting 04/24/2014  . Gastroparesis   . Abdominal pain 04/01/2014  . Chronic abdominal pain 01/21/2014  . Gastroenteritis 12/10/2013  . Chronic abdominal wound infection 08/16/2013  . MDD (major depressive disorder), recurrent episode, severe ([place]) 06/27/2013  . Wrist laceration 06/24/2013  . Nausea with vomiting 05/07/2013  . Diarrhea 05/07/2013  . Rectal bleeding 05/07/2013  . Abnormal LFTs 05/07/2013  . Adrenal mass, left ([place]) 05/07/2013  . Abdominal wall abscess at site  of surgical wound 04/19/2013  . Abdominal wall abscess 04/18/2013  . Frequent headaches 03/30/2013  . Sleep difficulties 03/30/2013  . Essential hypertension, benign 03/30/2013  . History of cocaine abuse 03/16/2013  . History of schizoaffective disorder 03/16/2013  . Bipolar disorder ([place]) 03/16/2013  . Personality disorder 03/16/2013  . Tobacco abuse 03/16/2013  . Alcohol abuse 03/16/2013  . Palpitations 03/16/2013  . Poor vision 03/16/2013  . History of gastric bypass 03/16/2013  . Status post hysterectomy with oophorectomy 03/16/2013  . Hypothyroid 03/16/2013  . Lupus ([place]) 03/16/2013    Past Surgical History:  Procedure Laterality Date  . ABDOMINAL HYSTERECTOMY   2013   [place]  . ABDOMINAL SURGERY    . ADRENALECTOMY Right   . AGILE CAPSULE N/A 01/05/2015   Procedure: AGILE CAPSULE;  Surgeon: Daneil Dolin, MD;  Location: [facility];  Service: Endoscopy;  Laterality: N/A;  0700  . Billroth II procedure      [place], first 2000, 2005/2006.  Marland Kitchen BIOPSY  05/20/2013   Procedure: BIOPSIES OF ASCENDING AND SIGMOID COLON;  Surgeon: Daneil Dolin, MD;  Location: [facility] ORS;  Service: Endoscopy;;  . BIOPSY  04/26/2014   Procedure: BIOPSIES;  Surgeon: Danie Binder, MD;  Location: [facility] ORS;  Service: Endoscopy;;  . CHOLECYSTECTOMY    . COLONOSCOPY     in [place]  . COLONOSCOPY WITH PROPOFOL N/A 05/20/2013   Dr.Rourk- inadequate prep, normal appearing rectum, grossly normal colon aside from pancolonic diverticula, normal terminal ileum bx= unremarkable colonic mucosa. Due for early interval 2016.   Marland Kitchen COLONOSCOPY WITH PROPOFOL N/A 04/26/2014   VEH:MCNOBS ileum/one colon polyp removed/moderate pan-colonic diverticulosis/large internal hemorrhoids  . COLONOSCOPY WITH PROPOFOL N/A 12/23/2014   Dr.Rourk- minimal internal hemorrhoids, pancolonic diverticulosis  . COLONOSCOPY WITH PROPOFOL N/A 08/23/2016   Procedure: COLONOSCOPY WITH PROPOFOL;  Surgeon: Danie Binder, MD;  Location: [facility];  Service: Endoscopy;  Laterality: N/A;  . DEBRIDEMENT OF ABDOMINAL WALL ABSCESS N/A 02/08/2013   Procedure: DEBRIDEMENT OF ABDOMINAL WALL ABSCESS;  Surgeon: Jamesetta So, MD;  Location: [facility] OR[facility];  Service: General;  Laterality: N/A;  . ESOPHAGOGASTRODUODENOSCOPY (EGD) WITH PROPOFOL N/A 05/20/2013   Dr.Rourk- s/p prior gastric surgery with normal esophagus, residual gastric mucosa and patent efferent limb  . ESOPHAGOGASTRODUODENOSCOPY (EGD) WITH PROPOFOL N/A 02/03/2014   Dr. Gala Romney:  s/p hemigastrectomy with retained gastric contents. Residual gastric mucosa and efferent limb appeared normal otherwise. Query gastroparesis.   Marland Kitchen ESOPHAGOGASTRODUODENOSCOPY (EGD) WITH PROPOFOL N/A  04/26/2014   JGG:[id number]'M ring/small HH/mild non-erosive gasrtitis/normal anastomosis  . ESOPHAGOGASTRODUODENOSCOPY (EGD) WITH PROPOFOL N/A 12/23/2014   Dr.Rourk- s/p prior hemigastrctomy, active oozing from anastomotic suture site, hemostasis achieved  . ESOPHAGOGASTRODUODENOSCOPY (EGD) WITH PROPOFOL N/A 05/23/2016   Dr. 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 tendon repar Right    wrist  . WOUND EXPLORATION Right 06/24/2013   Procedure: exploration of traumatic wound right wrist;  Surgeon: Tennis Must, MD;  Location: [facility];  Service: Orthopedics;  Laterality: Right;    OB History    Gravida Para Term Preterm AB Living   4 4 4     3    SAB TAB Ectopic Multiple Live Births                   Home Medications    Prior to Admission medications   Medication Sig Start Date End Date Taking? Authorizing Provider  lisinopril (PRINIVIL,ZESTRIL) 10 MG tablet Take 10 mg by mouth daily.   Yes [provider]  naproxen sodium (ANAPROX) 220 MG tablet Take 440 mg by mouth 2 (two) times daily as needed.   Yes [provider]  pantoprazole (PROTONIX) 40 MG tablet Take 1 tablet (40 mg total) by mouth 2 (two) times daily before a meal. 09/04/14  Yes Daleen Bo, MD  promethazine (PHENERGAN) 12.5 MG suppository Place 1 suppository (12.5 mg total) rectally every 6 (six) hours as needed for nausea or vomiting. 09/17/16  Yes Mahala Menghini, PA-C  HYDROcodone-acetaminophen (NORCO/VICODIN) 5-325 MG tablet Take 1 tablet by mouth every 6 (six) hours as needed for moderate pain. 09/17/16   Milton Ferguson, MD  ondansetron (ZOFRAN ODT) 4 MG disintegrating tablet 4mg  ODT q4 hours prn nausea/vomit 09/17/16   Milton Ferguson, MD  potassium chloride SA (K-DUR,KLOR-CON) 20 MEQ tablet Take 2 tablets (40 mEq total) by mouth 2 (two) times daily. Patient not taking: Reported on 09/17/2016 08/25/16   Rosita Fire, MD    sucralfate (CARAFATE) 1 g tablet Take 1 tablet (1 g total) by mouth 4 (four) times daily -  with meals and at bedtime. 09/17/16   Milton Ferguson, MD    Family History Family History  Problem Relation Age of Onset  . Brain cancer Son   . Schizophrenia Son   . Cancer Son        brain  . Lung cancer Father   . Cancer Father        mets  . Drug abuse Mother   . Breast cancer Maternal Aunt   . Bipolar disorder Maternal Aunt   . Drug abuse Maternal Aunt   . Colon cancer Maternal Grandmother        late 50s, early 77s  . Drug abuse Sister   . Drug abuse Brother   . Bipolar disorder Paternal Grandfather   . Bipolar disorder Cousin   . Liver disease Neg Hx     Social History Social History  Substance Use Topics  . Smoking status: Current Every Day Smoker    Packs/day: 0.50    Years: 35.00    Types: Cigarettes  . Smokeless tobacco: Never Used  . Alcohol use 0.0 oz/week     Comment: 4 beers daily      Allergies   Codeine; Morphine and related; and Reglan [metoclopramide]   Review of Systems Review of Systems  Constitutional: Negative for appetite change and fatigue.  HENT: Negative for congestion, ear discharge and sinus pressure.   Eyes: Negative for discharge.  Respiratory: Negative for cough.   Cardiovascular: Negative for chest pain.  Gastrointestinal: Positive for abdominal pain and hematochezia. Negative for diarrhea.  Genitourinary: Negative for frequency and hematuria.  Musculoskeletal: Negative for back pain.  Skin: Negative for rash.  Neurological: Negative for seizures and headaches.  Psychiatric/Behavioral: Negative for hallucinations.     Physical Exam Updated Vital Signs BP 109/77   Pulse 84   Temp 98.5 F (36.9 C) (Oral)   Resp 20   Ht 5' (1.524 m)   Wt 58.5 kg (129 lb)   SpO2 97%   BMI 25.19 kg/m   Physical Exam  Constitutional: She is oriented to person, place, and time. She appears well-developed.  HENT:  Head: Normocephalic.  Eyes:  Conjunctivae and EOM are normal. No scleral icterus.  Neck: Neck supple. No thyromegaly present.  Cardiovascular: Normal rate and regular rhythm.  Exam reveals no gallop and no friction rub.  No murmur heard. Pulmonary/Chest: No stridor. She has no wheezes. She has no rales. She exhibits no tenderness.  Abdominal: There is tenderness. There is no rebound.  Genitourinary:  Genitourinary Comments: Heme positive red blood on rectal exam  Musculoskeletal: Normal range of motion. She exhibits no edema.  Lymphadenopathy:    She has no cervical adenopathy.  Neurological: She is oriented to person, place, and time. She exhibits normal muscle tone. Coordination normal.  Skin: No rash noted. No erythema.  Psychiatric: She has a normal mood and affect. Her behavior is normal.     ED Treatments / Results  Labs (all labs ordered are listed, but only abnormal results are displayed) Labs Reviewed  COMPREHENSIVE METABOLIC PANEL - Abnormal; Notable for the following:       Result Value   Sodium 131 (*)    Chloride 95 (*)    CO2 20 (*)    BUN 5 (*)    AST 286 (*)    ALT 83 (*)    Alkaline Phosphatase 205 (*)    Anion gap 16 (*)    All other components within normal limits  CBC - Abnormal; Notable for the following:    RBC 3.81 (*)    Hemoglobin 11.4 (*)    HCT 32.4 (*)    RDW 16.7 (*)    All other components within normal limits  POC OCCULT BLOOD, ED - Abnormal; Notable for the following:    Fecal Occult Bld POSITIVE (*)    All other components within normal limits  I-STAT CHEM 8, ED - Abnormal; Notable for the following:    Sodium 132 (*)    Chloride 98 (*)    BUN <3 (*)    Calcium, Ion 1.13 (*)    All other components within normal limits  TYPE AND SCREEN    EKG  EKG Interpretation None       Radiology Dg Abd Acute W/chest  Result Date: 09/17/2016 CLINICAL DATA:  Epigastric pain, rectal bleeding, nausea, vomiting and dizziness for 3 days. EXAM: DG ABDOMEN ACUTE W/ 1V CHEST  COMPARISON:  Chest in two views abdomen 08/22/2016. FINDINGS: Single-view of the chest demonstrates a small focus of linear atelectasis in the right mid lung zone. Lungs are otherwise clear. Heart size is normal. No pneumothorax or pleural effusion. Two views the abdomen show no free intraperitoneal air. Multiple surgical clips and suture material are seen in the abdomen. The bowel gas pattern is nonobstructive. IMPRESSION: No acute finding chest or abdomen. Electronically Signed   By: Inge Rise M.D.   On: 09/17/2016 19:04    Procedures Procedures (including critical care time)  Medications Ordered in ED Medications  ondansetron (ZOFRAN) injection 4 mg (4 mg Intravenous Not Given 09/17/16 1828)  ondansetron (ZOFRAN) injection 4 mg (4 mg Intravenous Given 09/17/16 1705)  sodium chloride 0.9 % bolus 1,000 mL (0 mLs Intravenous Stopped 09/17/16 1853)  pantoprazole (PROTONIX) injection 40 mg (40 mg Intravenous Given 09/17/16 1738)  HYDROmorphone (DILAUDID) injection 0.5 mg (0.5 mg Intravenous Given 09/17/16 1738)  promethazine (PHENERGAN) injection 12.5 mg (12.5 mg Intravenous Given 09/17/16 1736)  promethazine (PHENERGAN) injection 12.5 mg (12.5 mg Intravenous Given 09/17/16 2039)  sodium chloride 0.9 % bolus 1,000 mL (1,000 mLs Intravenous New Bag/Given 09/17/16 2039)  HYDROmorphone (DILAUDID) injection 0.5 mg (0.5 mg Intravenous Given 09/17/16 2039)     Initial Impression / Assessment and Plan / ED Course  I have reviewed the triage vital signs and the nursing notes.  Pertinent  labs & imaging results that were available during my care of the patient were reviewed by me and considered in my medical decision making (see chart for details).     Patient with rectal bleeding that I suspect is hemorrhoids. She is hemodynamically stable hemoglobins normal. She also has abdominal pain which I'm going to treat his gastritis and had Carafate Vicodin and Zofran. She will follow-up with her  gastroenterologist  Final Clinical Impressions(s) / ED Diagnoses   Final diagnoses:  Pain of upper abdomen    New Prescriptions New Prescriptions   HYDROCODONE-ACETAMINOPHEN (NORCO/VICODIN) 5-325 MG TABLET    Take 1 tablet by mouth every 6 (six) hours as needed for moderate pain.   ONDANSETRON (ZOFRAN ODT) 4 MG DISINTEGRATING TABLET    4mg  ODT q4 hours prn nausea/vomit   SUCRALFATE (CARAFATE) 1 G TABLET    Take 1 tablet (1 g total) by mouth 4 (four) times daily -  with meals and at bedtime.     Milton Ferguson, MD 09/17/16 2120

## 2016-09-17 NOTE — ED Triage Notes (Addendum)
Pt c/o bright red and burgundy colored rectal bleeding, epigastric pain that radiates around to her back, nausea, vomiting, dizziness x 3 days. Pt reports her emesis smells of stool. Pt reports she was having so much bleeding yesterday that she had to wear a depends, but hasn't had that occur today.

## 2016-09-17 NOTE — Discharge Instructions (Signed)
Follow-up with your stomach doctor or your family doctor next week.    You saw your stomach doctor since you are having rectal bleeding too

## 2016-09-18 ENCOUNTER — Encounter (HOSPITAL_COMMUNITY): Payer: Self-pay | Admitting: *Deleted

## 2016-09-18 ENCOUNTER — Emergency Department (HOSPITAL_COMMUNITY)
Admission: EM | Admit: 2016-09-18 | Discharge: 2016-09-19 | Disposition: A | Discharge status: Home / Self Care | Payer: Medicare HMO | Attending: Emergency Medicine | Admitting: Emergency Medicine

## 2016-09-18 DIAGNOSIS — K625 Hemorrhage of anus and rectum: Secondary | ICD-10-CM

## 2016-09-18 DIAGNOSIS — E039 Hypothyroidism, unspecified: Secondary | ICD-10-CM | POA: Insufficient documentation

## 2016-09-18 DIAGNOSIS — Z885 Allergy status to narcotic agent status: Secondary | ICD-10-CM | POA: Insufficient documentation

## 2016-09-18 DIAGNOSIS — I10 Essential (primary) hypertension: Secondary | ICD-10-CM | POA: Diagnosis not present

## 2016-09-18 DIAGNOSIS — R69 Illness, unspecified: Secondary | ICD-10-CM | POA: Diagnosis not present

## 2016-09-18 DIAGNOSIS — F17210 Nicotine dependence, cigarettes, uncomplicated: Secondary | ICD-10-CM | POA: Insufficient documentation

## 2016-09-18 DIAGNOSIS — R1084 Generalized abdominal pain: Secondary | ICD-10-CM | POA: Diagnosis not present

## 2016-09-18 LAB — CBC
HEMATOCRIT: 32 % — AB (ref 36.0–46.0)
HEMOGLOBIN: 11.1 g/dL — AB (ref 12.0–15.0)
MCH: 30.1 pg (ref 26.0–34.0)
MCHC: 34.7 g/dL (ref 30.0–36.0)
MCV: 86.7 fL (ref 78.0–100.0)
Platelets: 180 10*3/uL (ref 150–400)
RBC: 3.69 MIL/uL — AB (ref 3.87–5.11)
RDW: 16.7 % — AB (ref 11.5–15.5)
WBC: 3.2 10*3/uL — AB (ref 4.0–10.5)

## 2016-09-18 LAB — POC OCCULT BLOOD, ED: Fecal Occult Bld: NEGATIVE

## 2016-09-18 MED ORDER — ACETAMINOPHEN 325 MG PO TABS
650.0000 mg | ORAL_TABLET | Freq: Once | ORAL | Status: AC
  Administered 2016-09-18: 650 mg via ORAL
  Filled 2016-09-18: qty 2

## 2016-09-18 MED ORDER — ONDANSETRON 4 MG PO TBDP
4.0000 mg | ORAL_TABLET | Freq: Once | ORAL | Status: AC
  Administered 2016-09-18: 4 mg via ORAL
  Filled 2016-09-18: qty 1

## 2016-09-18 MED ORDER — ONDANSETRON 8 MG PO TBDP
8.0000 mg | ORAL_TABLET | Freq: Once | ORAL | Status: AC
  Administered 2016-09-18: 8 mg via ORAL
  Filled 2016-09-18: qty 1

## 2016-09-18 NOTE — ED Provider Notes (Signed)
[facility] DEPT Provider Note   CSN: [id number] Arrival date & time: 09/18/16  1928     History   Chief Complaint Chief Complaint  Patient presents with  . Abdominal Pain    HPI Angel Price is a 51 y.o. female.  The history is provided by the patient.  Abdominal Pain   This is a recurrent problem. The problem occurs daily. The problem has not changed since onset.The pain is moderate. Associated symptoms include hematochezia and vomiting. Pertinent negatives include fever. The symptoms are aggravated by palpation. Nothing relieves the symptoms.  pt presents for another episode of abdominal pain and rectal bleeding She was just seen in the ED for rectal bleeding She had recent colonoscopy No hematemesis She reports "burgundy" bloody stools No melena reported  Past Medical History:  Diagnosis Date  . Abscess    soft tissue  . Adrenal mass ([place])   . Alcohol abuse   . Anxiety   . Blood transfusion without reported diagnosis   . Chronic abdominal pain   . Chronic wound infection of abdomen   . Colon polyp    colonoscopy 04/2014  . Depression   . Diverticulosis    colonoscopy 04/2014 moderat pan colonic  . Gastritis    EGD 05/2014  . Gastroparesis Nov 2015  . GERD (gastroesophageal reflux disease)   . Hemorrhoid    internal large  . Hiatal hernia   . History of Billroth II operation   . Hypertension   . Lung nodule    CT 02/2014 needs repeat 1 month  . Lung nodule < 6cm on CT 04/25/2014  . Lupus   . Nausea and vomiting    chronic, recurrent  . Schatzki's ring    patent per EGD 04/2014  . Sickle cell trait ([place])   . Suicide attempt ([place])   . 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 Bright red rectal bleeding 08/22/2016  . Hypotension due to blood loss   . Acute GI bleeding 05/24/2016  . Dieulafoy lesion of duodenum   . History of Billroth II operation   . Gastrointestinal hemorrhage  05/22/2016  . Absolute anemia   . Pancreatitis, acute   . Acute pancreatitis 10/01/2015  . Abnormal CT scan of lung 10/01/2015  . Alcohol intoxication ([place])   . Left-sided weakness   . Psychosomatic factor in physical condition   . Upper GI bleed   . Diverticulosis of colon with hemorrhage   . Chronic wound infection of abdomen 12/22/2014  . Thyroid disease 12/22/2014  . Hypertension 12/22/2014  . Ataxia 11/01/2014  . Hemorrhoids 04/26/2014  . Lung nodule 04/26/2014  . Diverticulosis   . Gastritis   . Hiatal hernia   . Schatzki's ring   . Acute blood loss anemia 04/25/2014  . Sinus tachycardia 04/25/2014  . Hypokalemia 04/25/2014  . Hyponatremia 04/25/2014  . Hematemesis with nausea 04/25/2014  . Lung nodule < 6cm on CT 04/25/2014  . Intractable nausea and vomiting 04/24/2014  . Gastroparesis   . Abdominal pain 04/01/2014  . Chronic abdominal pain 01/21/2014  . Gastroenteritis 12/10/2013  . Chronic abdominal wound infection 08/16/2013  . MDD (major depressive disorder), recurrent episode, severe ([place]) 06/27/2013  . Wrist laceration 06/24/2013  . Nausea with vomiting 05/07/2013  . Diarrhea 05/07/2013  . Rectal bleeding 05/07/2013  . Abnormal LFTs 05/07/2013  . Adrenal mass, left ([place]) 05/07/2013  . Abdominal wall abscess at site  of surgical wound 04/19/2013  . Abdominal wall abscess 04/18/2013  . Frequent headaches 03/30/2013  . Sleep difficulties 03/30/2013  . Essential hypertension, benign 03/30/2013  . History of cocaine abuse 03/16/2013  . History of schizoaffective disorder 03/16/2013  . Bipolar disorder ([place]) 03/16/2013  . Personality disorder 03/16/2013  . Tobacco abuse 03/16/2013  . Alcohol abuse 03/16/2013  . Palpitations 03/16/2013  . Poor vision 03/16/2013  . History of gastric bypass 03/16/2013  . Status post hysterectomy with oophorectomy 03/16/2013  . Hypothyroid 03/16/2013  . Lupus ([place]) 03/16/2013    Past Surgical History:  Procedure  Laterality Date  . ABDOMINAL HYSTERECTOMY  2013   [place]  . ABDOMINAL SURGERY    . ADRENALECTOMY Right   . AGILE CAPSULE N/A 01/05/2015   Procedure: AGILE CAPSULE;  Surgeon: Daneil Dolin, MD;  Location: [facility];  Service: Endoscopy;  Laterality: N/A;  0700  . Billroth II procedure      [place], first 2000, 2005/2006.  Marland Kitchen BIOPSY  05/20/2013   Procedure: BIOPSIES OF ASCENDING AND SIGMOID COLON;  Surgeon: Daneil Dolin, MD;  Location: [facility];  Service: Endoscopy;;  . BIOPSY  04/26/2014   Procedure: BIOPSIES;  Surgeon: Danie Binder, MD;  Location: [facility];  Service: Endoscopy;;  . CHOLECYSTECTOMY    . COLONOSCOPY     in [place]  . COLONOSCOPY WITH PROPOFOL N/A 05/20/2013   Dr.Rourk- inadequate prep, normal appearing rectum, grossly normal colon aside from pancolonic diverticula, normal terminal ileum bx= unremarkable colonic mucosa. Due for early interval 2016.   Marland Kitchen COLONOSCOPY WITH PROPOFOL N/A 04/26/2014   QIO:NGEXBM ileum/one colon polyp removed/moderate pan-colonic diverticulosis/large internal hemorrhoids  . COLONOSCOPY WITH PROPOFOL N/A 12/23/2014   Dr.Rourk- minimal internal hemorrhoids, pancolonic diverticulosis  . COLONOSCOPY WITH PROPOFOL N/A 08/23/2016   Procedure: COLONOSCOPY WITH PROPOFOL;  Surgeon: Danie Binder, MD;  [facility]: [facility];  Service: Endoscopy;  Laterality: N/A;  . DEBRIDEMENT OF ABDOMINAL WALL ABSCESS N/A 02/08/2013   Procedure: DEBRIDEMENT OF ABDOMINAL WALL ABSCESS;  Surgeon: Jamesetta So, MD;  Location: [facility];  Service: General;  Laterality: N/A;  . ESOPHAGOGASTRODUODENOSCOPY (EGD) WITH PROPOFOL N/A 05/20/2013   Dr.Rourk- s/p prior gastric surgery with normal esophagus, residual gastric mucosa and patent efferent limb  . ESOPHAGOGASTRODUODENOSCOPY (EGD) WITH PROPOFOL N/A 02/03/2014   Dr. Gala Romney:  s/p hemigastrectomy with retained gastric contents. Residual gastric mucosa and efferent limb appeared normal otherwise. Query gastroparesis.   Marland Kitchen  ESOPHAGOGASTRODUODENOSCOPY (EGD) WITH PROPOFOL N/A 04/26/2014   WUX:[id number] ring/small HH/mild non-erosive gasrtitis/normal anastomosis  . ESOPHAGOGASTRODUODENOSCOPY (EGD) WITH PROPOFOL N/A 12/23/2014   Dr.Rourk- s/p prior hemigastrctomy, active oozing from anastomotic suture site, hemostasis achieved  . ESOPHAGOGASTRODUODENOSCOPY (EGD) WITH PROPOFOL N/A 05/23/2016   Dr. 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 tendon repar Right    wrist  . WOUND EXPLORATION Right 06/24/2013   Procedure: exploration of traumatic wound right wrist;  Surgeon: Tennis Must, MD;  Location: [facility];  Service: Orthopedics;  Laterality: Right;    OB History    Gravida Para Term Preterm AB Living   4 4 4     3    SAB TAB Ectopic Multiple Live Births                   Home Medications    Prior to Admission medications   Medication Sig Start Date End Date Taking? Authorizing Provider  lisinopril (PRINIVIL,ZESTRIL) 10 MG tablet Take 10 mg by mouth daily.   Yes [provider]  naproxen sodium (ANAPROX) 220 MG tablet Take 440 mg by mouth 2 (two) times daily as needed.   Yes [provider]  pantoprazole (PROTONIX) 40 MG tablet Take 1 tablet (40 mg total) by mouth 2 (two) times daily before a meal. 09/04/14  Yes Daleen Bo, MD  HYDROcodone-acetaminophen (NORCO/VICODIN) 5-325 MG tablet Take 1 tablet by mouth every 6 (six) hours as needed for moderate pain. 09/17/16   Milton Ferguson, MD  ondansetron (ZOFRAN ODT) 4 MG disintegrating tablet 4mg  ODT q4 hours prn nausea/vomit Patient taking differently: Take 4 mg by mouth every 4 (four) hours as needed for nausea or vomiting. 4mg  ODT q4 hours prn nausea/vomit 09/17/16   Milton Ferguson, MD  potassium chloride SA (K-DUR,KLOR-CON) 20 MEQ tablet Take 2 tablets (40 mEq total) by mouth 2 (two) times daily. Patient not taking: Reported on 09/17/2016 08/25/16   Rosita Fire, MD  promethazine (PHENERGAN) 12.5 MG suppository Place 1 suppository (12.5 mg total) rectally every 6 (six) hours as needed for nausea or vomiting. 09/17/16   Mahala Menghini, PA-C  sucralfate (CARAFATE) 1 g tablet Take 1 tablet (1 g total) by mouth 4 (four) times daily -  with meals and at bedtime. 09/17/16   Milton Ferguson, MD    Family History Family History  Problem Relation Age of Onset  . Brain cancer Son   . Schizophrenia Son   . Cancer Son        brain  . Lung cancer Father   . Cancer Father        mets  . Drug abuse Mother   . Breast cancer Maternal Aunt   . Bipolar disorder Maternal Aunt   . Drug abuse Maternal Aunt   . Colon cancer Maternal Grandmother        late 28s, early 86s  . Drug abuse Sister   . Drug abuse Brother   . Bipolar disorder Paternal Grandfather   . Bipolar disorder Cousin   . Liver disease Neg Hx     Social History Social History  Substance Use Topics  . Smoking status: Current Every Day Smoker    Packs/day: 0.50    Years: 35.00    Types: Cigarettes  . Smokeless tobacco: Never Used  . Alcohol use 0.0 oz/week     Comment: 4 beers daily      Allergies   Codeine; Morphine and related; and Reglan [metoclopramide]   Review of Systems Review of Systems  Constitutional: Negative for fever.  HENT: Negative for nosebleeds.   Cardiovascular: Negative for chest pain.  Gastrointestinal: Positive for abdominal pain, blood in stool, hematochezia and vomiting.  Genitourinary: Negative for vaginal bleeding.  All other systems reviewed and are negative.    Physical Exam Updated Vital Signs BP (S) (!) 130/98 (BP Location: Right Arm)   Pulse 91   Temp 98.4 F (36.9 C) (Oral)   Resp 16   Ht 1.524 m (5')   Wt 58.5 kg (129 lb)   SpO2 98%   BMI 25.19 kg/m   Physical Exam  CONSTITUTIONAL: chronically ill appearing HEAD: Normocephalic/atraumatic EYES: EOMI/PERRL, conjunctiva pink ENMT: Mucous membranes moist NECK: supple no  meningeal signs SPINE/BACK:entire spine nontender CV: S1/S2 noted, no murmurs/rubs/gallops noted LUNGS: Lungs are clear to auscultation bilaterally, no apparent distress ABDOMEN: soft, multiple healed surgical scars noted.  Chronic wounds noted without erythema/bleeding.  Mild diffuse tenderness Rectal - nurse  Cecille Rubin present - no melena/blood.  External hemorrhoids.  No masses.  Hemoccult negative GU:no cva tenderness NEURO: Pt is awake/alert/appropriate, moves all extremitiesx4.  EXTREMITIES: pulses normal/equal, full ROM SKIN: warm, color normal PSYCH: no abnormalities of mood noted, alert and oriented to situation  ED Treatments / Results  Labs (all labs ordered are listed, but only abnormal results are displayed) Labs Reviewed  CBC - Abnormal; Notable for the following:       Result Value   WBC 3.2 (*)    RBC 3.69 (*)    Hemoglobin 11.1 (*)    HCT 32.0 (*)    RDW 16.7 (*)    All other components within normal limits  POC OCCULT BLOOD, ED    EKG  EKG Interpretation None       Radiology  Procedures Procedures (including critical care time)  Medications Ordered in ED Medications  ondansetron (ZOFRAN-ODT) disintegrating tablet 4 mg (4 mg Oral Given 09/18/16 2203)  ondansetron (ZOFRAN-ODT) disintegrating tablet 8 mg (8 mg Oral Given 09/18/16 2325)  acetaminophen (TYLENOL) tablet 650 mg (650 mg Oral Given 09/18/16 2325)     Initial Impression / Assessment and Plan / ED Course  I have reviewed the triage vital signs and the nursing notes.  Pertinent labs results that were available during my care of the patient were reviewed by me and considered in my medical decision making (see chart for details).     11:33 PM Pt with recent colonoscopy that reveal diverticula and internal hemorrhoids Previous h/o GI bleed likely due to NSAIDs Will recheck HGB Suspect ABD pain is chronic given history/ED visits 12:09 AM Pt stable HGB stable No acute distress noted Will refer back  to GI Final Clinical Impressions(s) / ED Diagnoses   Final diagnoses:  Rectal bleeding  Generalized abdominal pain    New Prescriptions New Prescriptions   No medications on file     Ripley Fraise, MD 09/19/16 0010

## 2016-09-18 NOTE — ED Triage Notes (Signed)
Pt reports burgundy/red rectal bleeding x 3 days. Pt reports upper abdominal pain that radiates into her back with n/v. Pt reports was seen here yesterday for the same but was unable to get her prescriptions filled.

## 2016-09-19 ENCOUNTER — Other Ambulatory Visit (HOSPITAL_COMMUNITY): Payer: Self-pay

## 2016-09-23 ENCOUNTER — Encounter (HOSPITAL_COMMUNITY): Payer: Self-pay

## 2016-09-23 ENCOUNTER — Emergency Department (HOSPITAL_COMMUNITY): Payer: Medicare HMO

## 2016-09-23 ENCOUNTER — Ambulatory Visit (HOSPITAL_COMMUNITY): Admit: 2016-09-23 | Payer: Medicare HMO | Admitting: Internal Medicine

## 2016-09-23 ENCOUNTER — Emergency Department (HOSPITAL_COMMUNITY)
Admission: EM | Admit: 2016-09-23 | Discharge: 2016-09-23 | Disposition: A | Discharge status: Home / Self Care | Payer: Medicare HMO | Attending: Emergency Medicine | Admitting: Emergency Medicine

## 2016-09-23 ENCOUNTER — Encounter (HOSPITAL_COMMUNITY): Payer: Self-pay | Admitting: Emergency Medicine

## 2016-09-23 DIAGNOSIS — E039 Hypothyroidism, unspecified: Secondary | ICD-10-CM | POA: Diagnosis not present

## 2016-09-23 DIAGNOSIS — G8929 Other chronic pain: Secondary | ICD-10-CM

## 2016-09-23 DIAGNOSIS — R1110 Vomiting, unspecified: Secondary | ICD-10-CM | POA: Diagnosis not present

## 2016-09-23 DIAGNOSIS — R112 Nausea with vomiting, unspecified: Secondary | ICD-10-CM | POA: Diagnosis not present

## 2016-09-23 DIAGNOSIS — R1013 Epigastric pain: Secondary | ICD-10-CM | POA: Diagnosis not present

## 2016-09-23 DIAGNOSIS — F17210 Nicotine dependence, cigarettes, uncomplicated: Secondary | ICD-10-CM | POA: Diagnosis not present

## 2016-09-23 DIAGNOSIS — N390 Urinary tract infection, site not specified: Secondary | ICD-10-CM | POA: Diagnosis not present

## 2016-09-23 DIAGNOSIS — Z79899 Other long term (current) drug therapy: Secondary | ICD-10-CM | POA: Insufficient documentation

## 2016-09-23 DIAGNOSIS — I10 Essential (primary) hypertension: Secondary | ICD-10-CM | POA: Diagnosis not present

## 2016-09-23 DIAGNOSIS — R109 Unspecified abdominal pain: Secondary | ICD-10-CM | POA: Diagnosis not present

## 2016-09-23 DIAGNOSIS — R69 Illness, unspecified: Secondary | ICD-10-CM | POA: Diagnosis not present

## 2016-09-23 LAB — CBC WITH DIFFERENTIAL/PLATELET
BASOS PCT: 0 %
Basophils Absolute: 0 10*3/uL (ref 0.0–0.1)
EOS ABS: 0 10*3/uL (ref 0.0–0.7)
Eosinophils Relative: 0 %
HCT: 34.5 % — ABNORMAL LOW (ref 36.0–46.0)
HEMOGLOBIN: 11.8 g/dL — AB (ref 12.0–15.0)
Lymphocytes Relative: 27 %
Lymphs Abs: 1.1 10*3/uL (ref 0.7–4.0)
MCH: 30.1 pg (ref 26.0–34.0)
MCHC: 34.2 g/dL (ref 30.0–36.0)
MCV: 88 fL (ref 78.0–100.0)
MONOS PCT: 9 %
Monocytes Absolute: 0.4 10*3/uL (ref 0.1–1.0)
NEUTROS ABS: 2.5 10*3/uL (ref 1.7–7.7)
NEUTROS PCT: 64 %
Platelets: 213 10*3/uL (ref 150–400)
RBC: 3.92 MIL/uL (ref 3.87–5.11)
RDW: 16.7 % — AB (ref 11.5–15.5)
WBC: 3.9 10*3/uL — ABNORMAL LOW (ref 4.0–10.5)

## 2016-09-23 LAB — COMPREHENSIVE METABOLIC PANEL
ALT: 66 U/L — ABNORMAL HIGH (ref 14–54)
ANION GAP: 13 (ref 5–15)
AST: 133 U/L — ABNORMAL HIGH (ref 15–41)
Albumin: 3.8 g/dL (ref 3.5–5.0)
Alkaline Phosphatase: 198 U/L — ABNORMAL HIGH (ref 38–126)
BILIRUBIN TOTAL: 1.9 mg/dL — AB (ref 0.3–1.2)
BUN: 7 mg/dL (ref 6–20)
CHLORIDE: 91 mmol/L — AB (ref 101–111)
CO2: 27 mmol/L (ref 22–32)
Calcium: 9.9 mg/dL (ref 8.9–10.3)
Creatinine, Ser: 0.65 mg/dL (ref 0.44–1.00)
Glucose, Bld: 92 mg/dL (ref 65–99)
POTASSIUM: 3.5 mmol/L (ref 3.5–5.1)
Sodium: 131 mmol/L — ABNORMAL LOW (ref 135–145)
TOTAL PROTEIN: 8.2 g/dL — AB (ref 6.5–8.1)

## 2016-09-23 LAB — LIPASE, BLOOD: LIPASE: 53 U/L — AB (ref 11–51)

## 2016-09-23 LAB — URINALYSIS, ROUTINE W REFLEX MICROSCOPIC
GLUCOSE, UA: 100 mg/dL — AB
Hgb urine dipstick: NEGATIVE
Nitrite: POSITIVE — AB
PH: 5 (ref 5.0–8.0)
PROTEIN: 30 mg/dL — AB
Specific Gravity, Urine: 1.025 (ref 1.005–1.030)

## 2016-09-23 LAB — URINALYSIS, MICROSCOPIC (REFLEX): RBC / HPF: NONE SEEN RBC/hpf (ref 0–5)

## 2016-09-23 LAB — RAPID URINE DRUG SCREEN, HOSP PERFORMED
AMPHETAMINES: NOT DETECTED
BARBITURATES: NOT DETECTED
Benzodiazepines: NOT DETECTED
COCAINE: NOT DETECTED
OPIATES: POSITIVE — AB
TETRAHYDROCANNABINOL: NOT DETECTED

## 2016-09-23 LAB — ETHANOL: Alcohol, Ethyl (B): <5 mg/dL (ref <5)

## 2016-09-23 SURGERY — COLONOSCOPY WITH PROPOFOL
Anesthesia: Monitor Anesthesia Care

## 2016-09-23 MED ORDER — PANTOPRAZOLE SODIUM 20 MG PO TBEC: 20.0000 mg | DELAYED_RELEASE_TABLET | Freq: Every day | ORAL | 0 refills | Status: DC

## 2016-09-23 MED ORDER — FENTANYL CITRATE (PF) 100 MCG/2ML IJ SOLN
50.0000 ug | INTRAMUSCULAR | Status: AC | PRN
  Administered 2016-09-23 (×2): 50 ug via INTRAVENOUS
  Filled 2016-09-23 (×2): qty 2

## 2016-09-23 MED ORDER — PROMETHAZINE HCL 25 MG/ML IJ SOLN
12.5000 mg | Freq: Once | INTRAMUSCULAR | Status: AC
  Administered 2016-09-23: 12.5 mg via INTRAVENOUS
  Filled 2016-09-23: qty 1

## 2016-09-23 MED ORDER — PANTOPRAZOLE SODIUM 40 MG IV SOLR
40.0000 mg | Freq: Once | INTRAVENOUS | Status: AC
  Administered 2016-09-23: 40 mg via INTRAVENOUS
  Filled 2016-09-23: qty 40

## 2016-09-23 MED ORDER — FAMOTIDINE IN NACL 20-0.9 MG/50ML-% IV SOLN
20.0000 mg | Freq: Once | INTRAVENOUS | Status: AC
  Administered 2016-09-23: 20 mg via INTRAVENOUS
  Filled 2016-09-23: qty 50

## 2016-09-23 MED ORDER — PROMETHAZINE HCL 25 MG PO TABS: 25.0000 mg | ORAL_TABLET | Freq: Four times a day (QID) | ORAL | 0 refills | Status: DC | PRN

## 2016-09-23 MED ORDER — SODIUM CHLORIDE 0.9 % IV BOLUS (SEPSIS)
1000.0000 mL | Freq: Once | INTRAVENOUS | Status: AC
  Administered 2016-09-23: 1000 mL via INTRAVENOUS

## 2016-09-23 MED ORDER — CEPHALEXIN 500 MG PO CAPS: 500.0000 mg | ORAL_CAPSULE | Freq: Four times a day (QID) | ORAL | 0 refills | Status: DC

## 2016-09-23 MED ORDER — IOPAMIDOL (ISOVUE-300) INJECTION 61%
100.0000 mL | Freq: Once | INTRAVENOUS | Status: AC | PRN
  Administered 2016-09-23: 100 mL via INTRAVENOUS

## 2016-09-23 NOTE — ED Notes (Signed)
Pt returned from ct

## 2016-09-23 NOTE — ED Triage Notes (Signed)
Per pt states that she is having pain in stomach and back.  She states that she was just here and the pain has gotten worse.  She is stating that she is having upper mid abdominal and mid back pain.  Pt states that she has been nauseous, vomiting, and had chills.  Pt denies fevers.  Pt states that she has not eaten in 3 days.

## 2016-09-23 NOTE — ED Provider Notes (Signed)
[facility] DEPT Provider Note   CSN: [id number] Arrival date & time: 09/23/16  0813     History   Chief Complaint Chief Complaint  Patient presents with  . Abdominal Pain    HPI Angel Price is a 51 y.o. female.   Abdominal Pain      Pt was seen at 0850.  Per pt, c/o gradual onset and persistence of constant acute flair of her chronic upper abd "pain" since yesterday.  Has been associated with multiple intermittent episodes of N/V and decreased PO intake.  Describes the abd pain as "cramping" and "sharp," with radiation into her back.  Denies diarrhea, no fevers, no back pain, no rash, no CP/SOB, no black or blood in stools or emesis. The symptoms have been associated with no other complaints. The patient has a significant history of similar symptoms previously, recently being evaluated for this complaint and multiple prior evals for same.         Past Medical History:  Diagnosis Date  . Abscess    soft tissue  . Adrenal mass ([place])   . Alcohol abuse   . Anxiety   . Blood transfusion without reported diagnosis   . Chronic abdominal pain   . Chronic wound infection of abdomen   . Colon polyp    colonoscopy 04/2014  . Depression   . Diverticulosis    colonoscopy 04/2014 moderat pan colonic  . Gastritis    EGD 05/2014  . Gastroparesis Nov 2015  . GERD (gastroesophageal reflux disease)   . Hemorrhoid    internal large  . Hiatal hernia   . History of Billroth II operation   . Hypertension   . Lung nodule    CT 02/2014 needs repeat 1 month  . Lung nodule < 6cm on CT 04/25/2014  . Lupus   . Nausea and vomiting    chronic, recurrent  . Schatzki's ring    patent per EGD 04/2014  . Sickle cell trait ([place])   . Suicide attempt ([place])   . 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 Bright red rectal bleeding 08/22/2016  . Hypotension due to blood loss   . Acute GI bleeding 05/24/2016  . Dieulafoy lesion  of duodenum   . History of Billroth II operation   . Gastrointestinal hemorrhage 05/22/2016  . Absolute anemia   . Pancreatitis, acute   . Acute pancreatitis 10/01/2015  . Abnormal CT scan of lung 10/01/2015  . Alcohol intoxication ([place])   . Left-sided weakness   . Psychosomatic factor in physical condition   . Upper GI bleed   . Diverticulosis of colon with hemorrhage   . Chronic wound infection of abdomen 12/22/2014  . Thyroid disease 12/22/2014  . Hypertension 12/22/2014  . Ataxia 11/01/2014  . Hemorrhoids 04/26/2014  . Lung nodule 04/26/2014  . Diverticulosis   . Gastritis   . Hiatal hernia   . Schatzki's ring   . Acute blood loss anemia 04/25/2014  . Sinus tachycardia 04/25/2014  . Hypokalemia 04/25/2014  . Hyponatremia 04/25/2014  . Hematemesis with nausea 04/25/2014  . Lung nodule < 6cm on CT 04/25/2014  . Intractable nausea and vomiting 04/24/2014  . Gastroparesis   . Abdominal pain 04/01/2014  . Chronic abdominal pain 01/21/2014  . Gastroenteritis 12/10/2013  . Chronic abdominal wound infection 08/16/2013  . MDD (major depressive disorder), recurrent episode, severe ([place]) 06/27/2013  . Wrist laceration 06/24/2013  .  Nausea with vomiting 05/07/2013  . Diarrhea 05/07/2013  . Rectal bleeding 05/07/2013  . Abnormal LFTs 05/07/2013  . Adrenal mass, left ([place]) 05/07/2013  . Abdominal wall abscess at site of surgical wound 04/19/2013  . Abdominal wall abscess 04/18/2013  . Frequent headaches 03/30/2013  . Sleep difficulties 03/30/2013  . Essential hypertension, benign 03/30/2013  . History of cocaine abuse 03/16/2013  . History of schizoaffective disorder 03/16/2013  . Bipolar disorder ([place]) 03/16/2013  . Personality disorder 03/16/2013  . Tobacco abuse 03/16/2013  . Alcohol abuse 03/16/2013  . Palpitations 03/16/2013  . Poor vision 03/16/2013  . History of gastric bypass 03/16/2013  . Status post hysterectomy with oophorectomy 03/16/2013  . Hypothyroid  03/16/2013  . Lupus ([place]) 03/16/2013    Past Surgical History:  Procedure Laterality Date  . ABDOMINAL HYSTERECTOMY  2013   [place]  . ABDOMINAL SURGERY    . ADRENALECTOMY Right   . AGILE CAPSULE N/A 01/05/2015   Procedure: AGILE CAPSULE;  Surgeon: Daneil Dolin, MD;  Location: [facility];  Service: Endoscopy;  Laterality: N/A;  0700  . Billroth II procedure      [place], first 2000, 2005/2006.  Marland Kitchen BIOPSY  05/20/2013   Procedure: BIOPSIES OF ASCENDING AND SIGMOID COLON;  Surgeon: Daneil Dolin, MD;  Location: [facility];  Service: Endoscopy;;  . BIOPSY  04/26/2014   Procedure: BIOPSIES;  Surgeon: Danie Binder, MD;  Location: [facility];  Service: Endoscopy;;  . CHOLECYSTECTOMY    . COLONOSCOPY     in [place]  . COLONOSCOPY WITH PROPOFOL N/A 05/20/2013   Dr.Rourk- inadequate prep, normal appearing rectum, grossly normal colon aside from pancolonic diverticula, normal terminal ileum bx= unremarkable colonic mucosa. Due for early interval 2016.   Marland Kitchen COLONOSCOPY WITH PROPOFOL N/A 04/26/2014   PJS:RPRXYV ileum/one colon polyp removed/moderate pan-colonic diverticulosis/large internal hemorrhoids  . COLONOSCOPY WITH PROPOFOL N/A 12/23/2014   Dr.Rourk- minimal internal hemorrhoids, pancolonic diverticulosis  . COLONOSCOPY WITH PROPOFOL N/A 08/23/2016   Procedure: COLONOSCOPY WITH PROPOFOL;  Surgeon: Danie Binder, MD;  [facility]: [facility];  Service: Endoscopy;  Laterality: N/A;  . DEBRIDEMENT OF ABDOMINAL WALL ABSCESS N/A 02/08/2013   Procedure: DEBRIDEMENT OF ABDOMINAL WALL ABSCESS;  Surgeon: Jamesetta So, MD;  Location: [facility];  Service: General;  Laterality: N/A;  . ESOPHAGOGASTRODUODENOSCOPY (EGD) WITH PROPOFOL N/A 05/20/2013   Dr.Rourk- s/p prior gastric surgery with normal esophagus, residual gastric mucosa and patent efferent limb  . ESOPHAGOGASTRODUODENOSCOPY (EGD) WITH PROPOFOL N/A 02/03/2014   Dr. Gala Romney:  s/p hemigastrectomy with retained gastric contents. Residual gastric mucosa and  efferent limb appeared normal otherwise. Query gastroparesis.   Marland Kitchen ESOPHAGOGASTRODUODENOSCOPY (EGD) WITH PROPOFOL N/A 04/26/2014   OPF:[id number] ring/small HH/mild non-erosive gasrtitis/normal anastomosis  . ESOPHAGOGASTRODUODENOSCOPY (EGD) WITH PROPOFOL N/A 12/23/2014   Dr.Rourk- s/p prior hemigastrctomy, active oozing from anastomotic suture site, hemostasis achieved  . ESOPHAGOGASTRODUODENOSCOPY (EGD) WITH PROPOFOL N/A 05/23/2016   Dr. 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 tendon repar Right    wrist  . WOUND EXPLORATION Right 06/24/2013   Procedure: exploration of traumatic wound right wrist;  Surgeon: Tennis Must, MD;  Location: [facility];  Service: Orthopedics;  Laterality: Right;    OB History    Gravida Para Term Preterm AB Living   4 4 4     3    SAB TAB Ectopic Multiple Live Births  Home Medications    Prior to Admission medications   Medication Sig Start Date End Date Taking? Authorizing Provider  HYDROcodone-acetaminophen (NORCO/VICODIN) 5-325 MG tablet Take 1 tablet by mouth every 6 (six) hours as needed for moderate pain. 09/17/16   Milton Ferguson, MD  lisinopril (PRINIVIL,ZESTRIL) 10 MG tablet Take 10 mg by mouth daily.    [provider]  naproxen sodium (ANAPROX) 220 MG tablet Take 440 mg by mouth 2 (two) times daily as needed.    [provider]  ondansetron (ZOFRAN ODT) 4 MG disintegrating tablet 4mg  ODT q4 hours prn nausea/vomit Patient taking differently: Take 4 mg by mouth every 4 (four) hours as needed for nausea or vomiting. 4mg  ODT q4 hours prn nausea/vomit 09/17/16   Milton Ferguson, MD  pantoprazole (PROTONIX) 40 MG tablet Take 1 tablet (40 mg total) by mouth 2 (two) times daily before a meal. 09/04/14   Daleen Bo, MD  potassium chloride SA (K-DUR,KLOR-CON) 20 MEQ tablet Take 2 tablets (40 mEq total) by mouth 2 (two) times  daily. Patient not taking: Reported on 09/17/2016 08/25/16   Rosita Fire, MD  promethazine (PHENERGAN) 12.5 MG suppository Place 1 suppository (12.5 mg total) rectally every 6 (six) hours as needed for nausea or vomiting. 09/17/16   Mahala Menghini, PA-C  sucralfate (CARAFATE) 1 g tablet Take 1 tablet (1 g total) by mouth 4 (four) times daily -  with meals and at bedtime. 09/17/16   Milton Ferguson, MD    Family History Family History  Problem Relation Age of Onset  . Brain cancer Son   . Schizophrenia Son   . Cancer Son        brain  . Lung cancer Father   . Cancer Father        mets  . Drug abuse Mother   . Breast cancer Maternal Aunt   . Bipolar disorder Maternal Aunt   . Drug abuse Maternal Aunt   . Colon cancer Maternal Grandmother        late 28s, early 64s  . Drug abuse Sister   . Drug abuse Brother   . Bipolar disorder Paternal Grandfather   . Bipolar disorder Cousin   . Liver disease Neg Hx     Social History Social History  Substance Use Topics  . Smoking status: Current Every Day Smoker    Packs/day: 0.50    Years: 35.00    Types: Cigarettes  . Smokeless tobacco: Never Used  . Alcohol use 0.0 oz/week     Comment: 4 beers daily      Allergies   Codeine; Morphine and related; and Reglan [metoclopramide]   Review of Systems Review of Systems  Gastrointestinal: Positive for abdominal pain.   ROS: Statement: All systems negative except as marked or noted in the HPI; Constitutional: Negative for fever and chills. ; ; Eyes: Negative for eye pain, redness and discharge. ; ; ENMT: Negative for ear pain, hoarseness, nasal congestion, sinus pressure and sore throat. ; ; Cardiovascular: Negative for chest pain, palpitations, diaphoresis, dyspnea and peripheral edema. ; ; Respiratory: Negative for cough, wheezing and stridor. ; ; Gastrointestinal: +N/V, abd pain. Negative for diarrhea, blood in stool, hematemesis, jaundice and rectal bleeding. . ; ; Genitourinary: Negative  for dysuria, flank pain and hematuria. ; ; Musculoskeletal: Negative for back pain and neck pain. Negative for swelling and trauma.; ; Skin: Negative for pruritus, rash, abrasions, blisters, bruising and skin lesion.; ; Neuro: Negative for headache, lightheadedness and neck stiffness. Negative  for weakness, altered level of consciousness, altered mental status, extremity weakness, paresthesias, involuntary movement, seizure and syncope.       Physical Exam Updated Vital Signs BP (!) 139/109 (BP Location: Left Arm)   Pulse (!) 104   Temp 98.3 F (36.8 C) (Oral)   Resp 18   Ht 5\' 3"  (1.6 m)   Wt 58.5 kg (129 lb)   SpO2 100%   BMI 22.85 kg/m   Physical Exam 0855: Physical examination:  Nursing notes reviewed; Vital signs and O2 SAT reviewed;  Constitutional: Well developed, Well nourished, Well hydrated, In no acute distress; Head:  Normocephalic, atraumatic; Eyes: EOMI, PERRL, No scleral icterus; ENMT: Mouth and pharynx normal, Mucous membranes moist; Neck: Supple, Full range of motion, No lymphadenopathy; Cardiovascular: Regular rate and rhythm, No gallop; Respiratory: Breath sounds clear & equal bilaterally, No wheezes.  Speaking full sentences with ease, Normal respiratory effort/excursion; Chest: Nontender, Movement normal; Abdomen: Soft, +mid-epigastric tenderness to palp. Chronic abd wall wounds, no bleeding. Nondistended, Normal bowel sounds; Genitourinary: No CVA tenderness; Extremities: Pulses normal, No tenderness, No edema, No calf edema or asymmetry.; Neuro: AA&Ox3, Major CN grossly intact.  Speech clear. No gross focal motor or sensory deficits in extremities.; Skin: Color normal, Warm, Dry.   ED Treatments / Results  Labs (all labs ordered are listed, but only abnormal results are displayed)   EKG  EKG Interpretation None       Radiology   Procedures Procedures (including critical care time)  Medications Ordered in ED Medications  sodium chloride 0.9 % bolus  1,000 mL (not administered)  fentaNYL (SUBLIMAZE) injection 50 mcg (not administered)  promethazine (PHENERGAN) injection 12.5 mg (not administered)     Initial Impression / Assessment and Plan / ED Course  I have reviewed the triage vital signs and the nursing notes.  Pertinent labs & imaging results that were available during my care of the patient were reviewed by me and considered in my medical decision making (see chart for details).  MDM Reviewed: previous chart, nursing note and vitals Reviewed previous: labs Interpretation: labs, x-ray and CT scan   Results for orders placed or performed during the hospital encounter of 09/23/16  Urine rapid drug screen (hosp performed)  Result Value Ref Range   Opiates POSITIVE (A) NONE DETECTED   Cocaine NONE DETECTED NONE DETECTED   Benzodiazepines NONE DETECTED NONE DETECTED   Amphetamines NONE DETECTED NONE DETECTED   Tetrahydrocannabinol NONE DETECTED NONE DETECTED   Barbiturates NONE DETECTED NONE DETECTED  Urinalysis, Routine w reflex microscopic  Result Value Ref Range   Color, Urine BROWN (A) YELLOW   APPearance CLOUDY (A) CLEAR   Specific Gravity, Urine 1.025 1.005 - 1.030   pH 5.0 5.0 - 8.0   Glucose, UA 100 (A) NEGATIVE mg/dL   Hgb urine dipstick NEGATIVE NEGATIVE   Bilirubin Urine LARGE (A) NEGATIVE   Ketones, ur >80 (A) NEGATIVE mg/dL   Protein, ur 30 (A) NEGATIVE mg/dL   Nitrite POSITIVE (A) NEGATIVE   Leukocytes, UA TRACE (A) NEGATIVE  Comprehensive metabolic panel  Result Value Ref Range   Sodium 131 (L) 135 - 145 mmol/L   Potassium 3.5 3.5 - 5.1 mmol/L   Chloride 91 (L) 101 - 111 mmol/L   CO2 27 22 - 32 mmol/L   Glucose, Bld 92 65 - 99 mg/dL   BUN 7 6 - 20 mg/dL   Creatinine, Ser 0.65 0.44 - 1.00 mg/dL   Calcium 9.9 8.9 - 10.3 mg/dL   Total  Protein 8.2 (H) 6.5 - 8.1 g/dL   Albumin 3.8 3.5 - 5.0 g/dL   AST 133 (H) 15 - 41 U/L   ALT 66 (H) 14 - 54 U/L   Alkaline Phosphatase 198 (H) 38 - 126 U/L   Total  Bilirubin 1.9 (H) 0.3 - 1.2 mg/dL   GFR calc non Af Amer >60 >60 mL/min   GFR calc Af Amer >60 >60 mL/min   Anion gap 13 5 - 15  Ethanol  Result Value Ref Range   Alcohol, Ethyl (B) <5 <5 mg/dL  Lipase, blood  Result Value Ref Range   Lipase 53 (H) 11 - 51 U/L  CBC with Differential  Result Value Ref Range   WBC 3.9 (L) 4.0 - 10.5 K/uL   RBC 3.92 3.87 - 5.11 MIL/uL   Hemoglobin 11.8 (L) 12.0 - 15.0 g/dL   HCT 34.5 (L) 36.0 - 46.0 %   MCV 88.0 78.0 - 100.0 fL   MCH 30.1 26.0 - 34.0 pg   MCHC 34.2 30.0 - 36.0 g/dL   RDW 16.7 (H) 11.5 - 15.5 %   Platelets 213 150 - 400 K/uL   Neutrophils Relative % 64 %   Neutro Abs 2.5 1.7 - 7.7 K/uL   Lymphocytes Relative 27 %   Lymphs Abs 1.1 0.7 - 4.0 K/uL   Monocytes Relative 9 %   Monocytes Absolute 0.4 0.1 - 1.0 K/uL   Eosinophils Relative 0 %   Eosinophils Absolute 0.0 0.0 - 0.7 K/uL   Basophils Relative 0 %   Basophils Absolute 0.0 0.0 - 0.1 K/uL  Urinalysis, Microscopic (reflex)  Result Value Ref Range   RBC / HPF NONE SEEN 0 - 5 RBC/hpf   WBC, UA 0-5 0 - 5 WBC/hpf   Bacteria, UA MANY (A) NONE SEEN   Squamous Epithelial / LPF 6-30 (A) NONE SEEN   Mucous PRESENT    Trichomonas, UA PRESENT    Amorphous Crystal PRESENT    Dg Chest 2 View Result Date: 09/23/2016 CLINICAL DATA:  Worsening mid abdominal pain, nausea, vomiting EXAM: CHEST  2 VIEW COMPARISON:  09/17/2016 FINDINGS: Heart and mediastinal contours are within normal limits. No focal opacities or effusions. No acute bony abnormality. IMPRESSION: No active cardiopulmonary disease. Electronically Signed   By: Rolm Baptise M.D.   On: 09/23/2016 10:05   Ct Abdomen Pelvis W Contrast Result Date: 09/23/2016 CLINICAL DATA:  Upper and mid abdominal pain with nausea and vomiting this morning. EXAM: CT ABDOMEN AND PELVIS WITH CONTRAST TECHNIQUE: Multidetector CT imaging of the abdomen and pelvis was performed using the standard protocol following bolus administration of intravenous  contrast. CONTRAST:  144mL ISOVUE-300 IOPAMIDOL (ISOVUE-300) INJECTION 61% COMPARISON:  Multiple prior CT scans. The most recent is March 2018. FINDINGS: Lower chest: The lung bases are clear of acute process. No pleural effusion or pulmonary lesions. The heart is normal in size. No pericardial effusion. The distal esophagus and aorta are unremarkable. Stable surgical changes at the GE junction. Hepatobiliary: Diffuse fatty infiltration of the liver. No focal hepatic lesions or intrahepatic biliary dilatation. The gallbladder surgically absent. Stable moderate common bile duct dilatation measuring 13.5 mm in the porta hepatis. Pancreas: Stable pancreatic atrophy. No mass, inflammation or ductal dilatation. Spleen: Normal size.  No focal lesions. Adrenals/Urinary Tract: Stable left adrenal gland nodule consistent with benign adenoma. The right adrenal gland is surgically absent. Both kidneys are unremarkable. No worrisome renal lesions or hydronephrosis. No obstructing ureteral calculi or bladder calculi. No bladder  mass. Stomach/Bowel: Stable surgical changes involving the stomach from gastric bypass surgery. No complicating features. No obstructive findings. The small bowel and colon are grossly normal. Stable surgical changes. The terminal ileum is normal. Vascular/Lymphatic: Stable atherosclerotic calcifications involving the aorta and iliac arteries. No aneurysm or dissection. The branch vessels are patent. The major venous structures are patent. Reproductive: The uterus and ovaries are surgically absent. Other: Remote surgical changes involving the anterior abdominal wall from a prior colostomy. There appears to be a persistent tract of fluid to the ostomy site which could be a chronic enterocutaneous fistula. Musculoskeletal: No significant bony findings. IMPRESSION: 1. No acute abdominal/pelvic findings, mass lesions or lymphadenopathy. 2. Stable extensive surgical changes involving the abdomen/pelvis.  Gastric bypass surgery without complicating features. 3. Diffuse fatty infiltration of the liver. 4. Stable common bile duct dilatation. 5. Stable postoperative changes involving the anterior abdominal wall with a persistent fluid-filled tract leading to the ostomy site. 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 Electronically Signed   By: Marijo Sanes M.D.   On: 09/23/2016 11:04    1125:  Labs per baseline. CT scan reassuring.  Pt's UDS +opiates. [employer] Controlled Substance Database [place], as well as [place] P[place] Database: Neither have rx opiates filled. Will not rx narcotics today. Pt has tol PO well while in the ED without N/V. No stooling while in the ED. Long hx of chronic pain with multiple ED visits for same.  Pt endorses acute flair of her usual long standing chronic pain today, no change from her usual chronic pain pattern.  Pt encouraged to f/u with her PMD and GI MD for good continuity of care and control of her chronic pain.  Pt verb understanding. Dx and testing d/w pt and family.  Questions answered.  Verb understanding, agreeable to d/c home with outpt f/u.     Final Clinical Impressions(s) / ED Diagnoses   Final diagnoses:  None    New Prescriptions New Prescriptions   No medications on file      Francine Graven, DO 09/27/16 1547

## 2016-09-23 NOTE — Discharge Instructions (Signed)
Eat a bland diet, avoiding greasy, fatty, fried foods, as well as spicy and acidic foods or beverages.  Avoid eating within the hour or 2 before going to bed or laying down.  Also avoid teas, colas, coffee, chocolate, pepermint and spearment. May also take over the counter maalox/mylanta, as directed on packaging, as needed for discomfort.  Take the prescriptions as directed.  Call your regular medical doctor and the GI doctor today to schedule a follow up appointment this week.  Return to the Emergency Department immediately if worsening.

## 2016-10-01 ENCOUNTER — Ambulatory Visit: Payer: Medicare HMO | Admitting: General Surgery

## 2016-10-18 DIAGNOSIS — I10 Essential (primary) hypertension: Secondary | ICD-10-CM | POA: Diagnosis not present

## 2016-10-18 DIAGNOSIS — R69 Illness, unspecified: Secondary | ICD-10-CM | POA: Diagnosis not present

## 2016-10-18 DIAGNOSIS — Z1389 Encounter for screening for other disorder: Secondary | ICD-10-CM | POA: Diagnosis not present

## 2016-10-18 DIAGNOSIS — K210 Gastro-esophageal reflux disease with esophagitis: Secondary | ICD-10-CM | POA: Diagnosis not present

## 2016-10-21 ENCOUNTER — Encounter (HOSPITAL_COMMUNITY): Payer: Self-pay | Admitting: Emergency Medicine

## 2016-10-21 ENCOUNTER — Emergency Department (HOSPITAL_COMMUNITY): Payer: Medicare HMO

## 2016-10-21 ENCOUNTER — Emergency Department (HOSPITAL_COMMUNITY)
Admission: EM | Admit: 2016-10-21 | Discharge: 2016-10-21 | Disposition: A | Discharge status: Home / Self Care | Payer: Medicare HMO | Attending: Emergency Medicine | Admitting: Emergency Medicine

## 2016-10-21 DIAGNOSIS — M79671 Pain in right foot: Secondary | ICD-10-CM | POA: Diagnosis not present

## 2016-10-21 DIAGNOSIS — R52 Pain, unspecified: Secondary | ICD-10-CM

## 2016-10-21 DIAGNOSIS — S8992XA Unspecified injury of left lower leg, initial encounter: Secondary | ICD-10-CM | POA: Diagnosis not present

## 2016-10-21 DIAGNOSIS — E876 Hypokalemia: Secondary | ICD-10-CM | POA: Diagnosis not present

## 2016-10-21 DIAGNOSIS — M79652 Pain in left thigh: Secondary | ICD-10-CM | POA: Diagnosis not present

## 2016-10-21 DIAGNOSIS — M7732 Calcaneal spur, left foot: Secondary | ICD-10-CM | POA: Diagnosis not present

## 2016-10-21 DIAGNOSIS — F17210 Nicotine dependence, cigarettes, uncomplicated: Secondary | ICD-10-CM | POA: Diagnosis not present

## 2016-10-21 DIAGNOSIS — R1084 Generalized abdominal pain: Secondary | ICD-10-CM | POA: Diagnosis not present

## 2016-10-21 DIAGNOSIS — W19XXXA Unspecified fall, initial encounter: Secondary | ICD-10-CM

## 2016-10-21 DIAGNOSIS — K625 Hemorrhage of anus and rectum: Secondary | ICD-10-CM | POA: Diagnosis not present

## 2016-10-21 DIAGNOSIS — R600 Localized edema: Secondary | ICD-10-CM

## 2016-10-21 DIAGNOSIS — R112 Nausea with vomiting, unspecified: Secondary | ICD-10-CM | POA: Insufficient documentation

## 2016-10-21 DIAGNOSIS — R609 Edema, unspecified: Secondary | ICD-10-CM

## 2016-10-21 DIAGNOSIS — Z79899 Other long term (current) drug therapy: Secondary | ICD-10-CM | POA: Diagnosis not present

## 2016-10-21 DIAGNOSIS — I10 Essential (primary) hypertension: Secondary | ICD-10-CM | POA: Diagnosis not present

## 2016-10-21 DIAGNOSIS — E039 Hypothyroidism, unspecified: Secondary | ICD-10-CM | POA: Diagnosis not present

## 2016-10-21 DIAGNOSIS — R109 Unspecified abdominal pain: Secondary | ICD-10-CM | POA: Diagnosis not present

## 2016-10-21 DIAGNOSIS — R69 Illness, unspecified: Secondary | ICD-10-CM | POA: Diagnosis not present

## 2016-10-21 DIAGNOSIS — M25552 Pain in left hip: Secondary | ICD-10-CM | POA: Diagnosis not present

## 2016-10-21 LAB — URINALYSIS, ROUTINE W REFLEX MICROSCOPIC
BILIRUBIN URINE: NEGATIVE
Bacteria, UA: NONE SEEN
Glucose, UA: NEGATIVE mg/dL
Hgb urine dipstick: NEGATIVE
KETONES UR: NEGATIVE mg/dL
Nitrite: NEGATIVE
PH: 5 (ref 5.0–8.0)
Protein, ur: NEGATIVE mg/dL
Specific Gravity, Urine: 1.017 (ref 1.005–1.030)

## 2016-10-21 LAB — COMPREHENSIVE METABOLIC PANEL
ALK PHOS: 98 U/L (ref 38–126)
ALT: 15 U/L (ref 14–54)
AST: 27 U/L (ref 15–41)
Albumin: 3 g/dL — ABNORMAL LOW (ref 3.5–5.0)
Anion gap: 10 (ref 5–15)
BUN: 5 mg/dL — ABNORMAL LOW (ref 6–20)
CALCIUM: 9.1 mg/dL (ref 8.9–10.3)
CO2: 23 mmol/L (ref 22–32)
CREATININE: 0.53 mg/dL (ref 0.44–1.00)
Chloride: 106 mmol/L (ref 101–111)
Glucose, Bld: 133 mg/dL — ABNORMAL HIGH (ref 65–99)
Potassium: 3.2 mmol/L — ABNORMAL LOW (ref 3.5–5.1)
Sodium: 139 mmol/L (ref 135–145)
Total Bilirubin: 0.7 mg/dL (ref 0.3–1.2)
Total Protein: 6.7 g/dL (ref 6.5–8.1)

## 2016-10-21 LAB — CBC
HCT: 30 % — ABNORMAL LOW (ref 36.0–46.0)
Hemoglobin: 10.5 g/dL — ABNORMAL LOW (ref 12.0–15.0)
MCH: 29 pg (ref 26.0–34.0)
MCHC: 35 g/dL (ref 30.0–36.0)
MCV: 82.9 fL (ref 78.0–100.0)
PLATELETS: 442 10*3/uL — AB (ref 150–400)
RBC: 3.62 MIL/uL — AB (ref 3.87–5.11)
RDW: 15.6 % — ABNORMAL HIGH (ref 11.5–15.5)
WBC: 6.1 10*3/uL (ref 4.0–10.5)

## 2016-10-21 LAB — MAGNESIUM: Magnesium: 1.6 mg/dL — ABNORMAL LOW (ref 1.7–2.4)

## 2016-10-21 LAB — LIPASE, BLOOD: LIPASE: 19 U/L (ref 11–51)

## 2016-10-21 LAB — POC OCCULT BLOOD, ED: Fecal Occult Bld: POSITIVE — AB

## 2016-10-21 MED ORDER — HYDROCORTISONE 2.5 % RE CREA: TOPICAL_CREAM | RECTAL | 0 refills | Status: DC

## 2016-10-21 MED ORDER — FUROSEMIDE 10 MG/ML IJ SOLN
20.0000 mg | Freq: Once | INTRAMUSCULAR | Status: AC
  Administered 2016-10-21: 20 mg via INTRAVENOUS
  Filled 2016-10-21: qty 2

## 2016-10-21 MED ORDER — POTASSIUM CHLORIDE 20 MEQ PO PACK
40.0000 meq | PACK | Freq: Once | ORAL | Status: AC
  Administered 2016-10-21: 40 meq via ORAL
  Filled 2016-10-21: qty 2

## 2016-10-21 MED ORDER — HYDROMORPHONE HCL 1 MG/ML IJ SOLN
INTRAMUSCULAR | Status: AC
  Filled 2016-10-21: qty 1

## 2016-10-21 MED ORDER — HYDROMORPHONE HCL 1 MG/ML IJ SOLN
1.0000 mg | Freq: Once | INTRAMUSCULAR | Status: AC
  Administered 2016-10-21: 1 mg via INTRAVENOUS

## 2016-10-21 MED ORDER — FENTANYL CITRATE (PF) 100 MCG/2ML IJ SOLN
50.0000 ug | Freq: Once | INTRAMUSCULAR | Status: DC
  Filled 2016-10-21: qty 2

## 2016-10-21 MED ORDER — POTASSIUM CHLORIDE CRYS ER 20 MEQ PO TBCR: 20.0000 meq | EXTENDED_RELEASE_TABLET | Freq: Every day | ORAL | 0 refills | Status: DC

## 2016-10-21 MED ORDER — ONDANSETRON HCL 4 MG/2ML IJ SOLN
4.0000 mg | Freq: Once | INTRAMUSCULAR | Status: AC
  Administered 2016-10-21: 4 mg via INTRAVENOUS
  Filled 2016-10-21: qty 2

## 2016-10-21 MED ORDER — FAMOTIDINE IN NACL 20-0.9 MG/50ML-% IV SOLN
20.0000 mg | Freq: Once | INTRAVENOUS | Status: AC
  Administered 2016-10-21: 20 mg via INTRAVENOUS
  Filled 2016-10-21: qty 50

## 2016-10-21 MED ORDER — HYDROMORPHONE HCL 1 MG/ML IJ SOLN
1.0000 mg | Freq: Once | INTRAMUSCULAR | Status: AC
  Administered 2016-10-21: 1 mg via INTRAVENOUS
  Filled 2016-10-21: qty 1

## 2016-10-21 MED ORDER — HYDROCODONE-ACETAMINOPHEN 5-325 MG PO TABS: 1.0000 | ORAL_TABLET | ORAL | 0 refills | Status: DC | PRN

## 2016-10-21 MED ORDER — MAGNESIUM SULFATE 50 % IJ SOLN
2.0000 g | Freq: Once | INTRAVENOUS | Status: AC
  Administered 2016-10-21: 2 g via INTRAVENOUS
  Filled 2016-10-21: qty 4

## 2016-10-21 NOTE — ED Notes (Signed)
IV d/c'd to left forearm, catheter intact, site wnl and bandaid applied

## 2016-10-21 NOTE — ED Triage Notes (Signed)
Patient states that she is having abdominal pain that started 3 days ago with nausea and vomiting.. States she stopped drinking alcohol a month ago. She says she has seen bright red in her stools as well.

## 2016-10-21 NOTE — Progress Notes (Signed)
Was the fall witnessed: no  Patient condition before and after the fall: pt was having bilateral feet x-rays  Patient's reaction to the fall: patient stated she had a cramp in her leg and fell from table  Name of the doctor that was notified including date and time:  Joylene Igo notified at 15:40p on 10/21/2016   Any interventions and vital signs: yes, patient was helped up onto the x-ray table.  Dr Carolynne Edouard ordered Pelvis and left femur x-rays

## 2016-10-21 NOTE — ED Provider Notes (Signed)
[facility] DEPT Provider Note   CSN: [id number] Arrival date & time: 10/21/16  1137     History   Chief Complaint Chief Complaint  Patient presents with  . Abdominal Pain    HPI Angel Price is a 51 y.o. female.  Pt presents to the ED today with abdominal pain with n/v, bloody stools, bilateral foot pain and swelling.  Pt stopped drinking a month ago and her chronic abdominal pain has been not been bothering her.  However, it started again 3 days ago.  She has also noticed bilateral lower extremity swelling a few days ago as well.  She saw her pcp who started her on zestoretic with 12.5 mg of hctz.  This has not helped the swelling.  Pt did see Dr. Oneida Alar (GI) in June for the same.  Bleeding due to hemorrhoids.      Past Medical History:  Diagnosis Date  . Abscess    soft tissue  . Adrenal mass ([place])   . Alcohol abuse   . Anxiety   . Blood transfusion without reported diagnosis   . Chronic abdominal pain   . Chronic wound infection of abdomen   . Colon polyp    colonoscopy 04/2014  . Depression   . Diverticulosis    colonoscopy 04/2014 moderat pan colonic  . Gastritis    EGD 05/2014  . Gastroparesis Nov 2015  . GERD (gastroesophageal reflux disease)   . Hemorrhoid    internal large  . Hiatal hernia   . History of Billroth II operation   . Hypertension   . Lung nodule    CT 02/2014 needs repeat 1 month  . Lung nodule < 6cm on CT 04/25/2014  . Lupus   . Nausea and vomiting    chronic, recurrent  . Schatzki's ring    patent per EGD 04/2014  . Sickle cell trait ([place])   . Suicide attempt ([place])   . 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 Bright red rectal bleeding 08/22/2016  . Hypotension due to blood loss   . Acute GI bleeding 05/24/2016  . Dieulafoy lesion of duodenum   . History of Billroth II operation   . Gastrointestinal hemorrhage 05/22/2016  . Absolute anemia   . Pancreatitis,  acute   . Acute pancreatitis 10/01/2015  . Abnormal CT scan of lung 10/01/2015  . Alcohol intoxication ([place])   . Left-sided weakness   . Psychosomatic factor in physical condition   . Upper GI bleed   . Diverticulosis of colon with hemorrhage   . Chronic wound infection of abdomen 12/22/2014  . Thyroid disease 12/22/2014  . Hypertension 12/22/2014  . Ataxia 11/01/2014  . Hemorrhoids 04/26/2014  . Lung nodule 04/26/2014  . Diverticulosis   . Gastritis   . Hiatal hernia   . Schatzki's ring   . Acute blood loss anemia 04/25/2014  . Sinus tachycardia 04/25/2014  . Hypokalemia 04/25/2014  . Hyponatremia 04/25/2014  . Hematemesis with nausea 04/25/2014  . Lung nodule < 6cm on CT 04/25/2014  . Intractable nausea and vomiting 04/24/2014  . Gastroparesis   . Abdominal pain 04/01/2014  . Chronic abdominal pain 01/21/2014  . Gastroenteritis 12/10/2013  . Chronic abdominal wound infection 08/16/2013  . MDD (major depressive disorder), recurrent episode, severe ([place]) 06/27/2013  . Wrist laceration 06/24/2013  . Nausea with vomiting 05/07/2013  . Diarrhea 05/07/2013  . Rectal bleeding 05/07/2013  . Abnormal LFTs  05/07/2013  . Adrenal mass, left ([place]) 05/07/2013  . Abdominal wall abscess at site of surgical wound 04/19/2013  . Abdominal wall abscess 04/18/2013  . Frequent headaches 03/30/2013  . Sleep difficulties 03/30/2013  . Essential hypertension, benign 03/30/2013  . History of cocaine abuse 03/16/2013  . History of schizoaffective disorder 03/16/2013  . Bipolar disorder ([place]) 03/16/2013  . Personality disorder 03/16/2013  . Tobacco abuse 03/16/2013  . Alcohol abuse 03/16/2013  . Palpitations 03/16/2013  . Poor vision 03/16/2013  . History of gastric bypass 03/16/2013  . Status post hysterectomy with oophorectomy 03/16/2013  . Hypothyroid 03/16/2013  . Lupus ([place]) 03/16/2013    Past Surgical History:  Procedure Laterality Date  . ABDOMINAL HYSTERECTOMY  2013    [place]  . ABDOMINAL SURGERY    . ADRENALECTOMY Right   . AGILE CAPSULE N/A 01/05/2015   Procedure: AGILE CAPSULE;  Surgeon: Daneil Dolin, MD;  Location: [facility];  Service: Endoscopy;  Laterality: N/A;  0700  . Billroth II procedure      [place], first 2000, 2005/2006.  Marland Kitchen BIOPSY  05/20/2013   Procedure: BIOPSIES OF ASCENDING AND SIGMOID COLON;  Surgeon: Daneil Dolin, MD;  Location: [facility] ORS;  Service: Endoscopy;;  . BIOPSY  04/26/2014   Procedure: BIOPSIES;  Surgeon: Danie Binder, MD;  Location: [facility];  Service: Endoscopy;;  . CHOLECYSTECTOMY    . COLONOSCOPY     in [place]  . COLONOSCOPY WITH PROPOFOL N/A 05/20/2013   Dr.Rourk- inadequate prep, normal appearing rectum, grossly normal colon aside from pancolonic diverticula, normal terminal ileum bx= unremarkable colonic mucosa. Due for early interval 2016.   Marland Kitchen COLONOSCOPY WITH PROPOFOL N/A 04/26/2014   ZTI:WPYKDX ileum/one colon polyp removed/moderate pan-colonic diverticulosis/large internal hemorrhoids  . COLONOSCOPY WITH PROPOFOL N/A 12/23/2014   Dr.Rourk- minimal internal hemorrhoids, pancolonic diverticulosis  . COLONOSCOPY WITH PROPOFOL N/A 08/23/2016   Procedure: COLONOSCOPY WITH PROPOFOL;  Surgeon: Danie Binder, MD;  Location: [facility];  Service: Endoscopy;  Laterality: N/A;  . DEBRIDEMENT OF ABDOMINAL WALL ABSCESS N/A 02/08/2013   Procedure: DEBRIDEMENT OF ABDOMINAL WALL ABSCESS;  Surgeon: Jamesetta So, MD;  Location: [facility];  Service: General;  Laterality: N/A;  . ESOPHAGOGASTRODUODENOSCOPY (EGD) WITH PROPOFOL N/A 05/20/2013   Dr.Rourk- s/p prior gastric surgery with normal esophagus, residual gastric mucosa and patent efferent limb  . ESOPHAGOGASTRODUODENOSCOPY (EGD) WITH PROPOFOL N/A 02/03/2014   Dr. Gala Romney:  s/p hemigastrectomy with retained gastric contents. Residual gastric mucosa and efferent limb appeared normal otherwise. Query gastroparesis.   Marland Kitchen ESOPHAGOGASTRODUODENOSCOPY (EGD) WITH PROPOFOL N/A 04/26/2014    IPJ:[id number] ring/small HH/mild non-erosive gasrtitis/normal anastomosis  . ESOPHAGOGASTRODUODENOSCOPY (EGD) WITH PROPOFOL N/A 12/23/2014   Dr.Rourk- s/p prior hemigastrctomy, active oozing from anastomotic suture site, hemostasis achieved  . ESOPHAGOGASTRODUODENOSCOPY (EGD) WITH PROPOFOL N/A 05/23/2016   Dr. 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 tendon repar Right    wrist  . WOUND EXPLORATION Right 06/24/2013   Procedure: exploration of traumatic wound right wrist;  Surgeon: Tennis Must, MD;  Location: [facility];  Service: Orthopedics;  Laterality: Right;    OB History    Gravida Para Term Preterm AB Living   4 4 4     3    SAB TAB Ectopic Multiple Live Births                   Home Medications    Prior  to Admission medications   Medication Sig Start Date End Date Taking? Authorizing Provider  lisinopril-hydrochlorothiazide (PRINZIDE,ZESTORETIC) 20-12.5 MG tablet Take 1 tablet by mouth daily.   Yes [provider]  naproxen sodium (ANAPROX) 220 MG tablet Take 220-440 mg by mouth 2 (two) times daily as needed (pain).    Yes [provider]  pantoprazole (PROTONIX) 20 MG tablet Take 1 tablet (20 mg total) by mouth daily. 09/23/16  Yes Francine Graven, DO  polyethylene glycol (MIRALAX / GLYCOLAX) packet Take 17 g by mouth daily as needed for moderate constipation or severe constipation.   Yes [provider]  sucralfate (CARAFATE) 1 g tablet Take 1 tablet (1 g total) by mouth 4 (four) times daily -  with meals and at bedtime. 09/17/16  Yes Milton Ferguson, MD  vitamin B-12 (CYANOCOBALAMIN) 100 MCG tablet Take 100 mcg by mouth daily.   Yes [provider]  cephALEXin (KEFLEX) 500 MG capsule Take 1 capsule (500 mg total) by mouth 4 (four) times daily. Patient not taking: Reported on 10/21/2016 09/23/16   Francine Graven, DO  HYDROcodone-acetaminophen (NORCO/VICODIN)  5-325 MG tablet Take 1 tablet by mouth every 4 (four) hours as needed. 10/21/16   Isla Pence, MD  hydrocortisone (ANUSOL-HC) 2.5 % rectal cream Apply rectally 2 times daily 10/21/16   Isla Pence, MD  ondansetron (ZOFRAN ODT) 4 MG disintegrating tablet 4mg  ODT q4 hours prn nausea/vomit Patient not taking: Reported on 10/21/2016 09/17/16   Milton Ferguson, MD  potassium chloride SA (K-DUR,KLOR-CON) 20 MEQ tablet Take 1 tablet (20 mEq total) by mouth daily. 10/21/16   Isla Pence, MD  promethazine (PHENERGAN) 25 MG tablet Take 1 tablet (25 mg total) by mouth every 6 (six) hours as needed for nausea or vomiting. Patient not taking: Reported on 10/21/2016 09/23/16   Francine Graven, DO    Family History Family History  Problem Relation Age of Onset  . Brain cancer Son   . Schizophrenia Son   . Cancer Son        brain  . Lung cancer Father   . Cancer Father        mets  . Drug abuse Mother   . Breast cancer Maternal Aunt   . Bipolar disorder Maternal Aunt   . Drug abuse Maternal Aunt   . Colon cancer Maternal Grandmother        late 19s, early 32s  . Drug abuse Sister   . Drug abuse Brother   . Bipolar disorder Paternal Grandfather   . Bipolar disorder Cousin   . Liver disease Neg Hx     Social History Social History  Substance Use Topics  . Smoking status: Current Every Day Smoker    Packs/day: 0.50    Years: 35.00    Types: Cigarettes  . Smokeless tobacco: Never Used  . Alcohol use 0.0 oz/week     Comment: 4 beers daily      Allergies   Codeine; Morphine and related; and Reglan [metoclopramide]   Review of Systems Review of Systems  Cardiovascular: Positive for leg swelling.  Gastrointestinal: Positive for abdominal pain and blood in stool.  All other systems reviewed and are negative.    Physical Exam Updated Vital Signs BP (!) 141/93   Pulse 79   Temp 98.3 F (36.8 C) (Oral)   Resp 18   Ht 5\' 3"  (1.6 m)   Wt 62.1 kg (137 lb)   SpO2 94%   BMI 24.27  kg/m   Physical Exam  Constitutional: She is oriented to person, place, and time. She appears well-developed and well-nourished.  HENT:  Head: Normocephalic and atraumatic.  Right Ear: External ear normal.  Left Ear: External ear normal.  Nose: Nose normal.  Mouth/Throat: Oropharynx is clear and moist.  Eyes: Pupils are equal, round, and reactive to light. Conjunctivae and EOM are normal.  Neck: Normal range of motion. Neck supple.  Cardiovascular: Normal rate, regular rhythm, normal heart sounds and intact distal pulses.   Pulmonary/Chest: Effort normal and breath sounds normal.  Abdominal: Bowel sounds are normal. There is tenderness in the epigastric area.  Musculoskeletal: She exhibits edema.  Neurological: She is alert and oriented to person, place, and time.  Skin: Skin is warm.  Psychiatric: She has a normal mood and affect. Her behavior is normal. Judgment and thought content normal.  Nursing note and vitals reviewed.    ED Treatments / Results  Labs (all labs ordered are listed, but only abnormal results are displayed) Labs Reviewed  COMPREHENSIVE METABOLIC PANEL - Abnormal; Notable for the following:       Result Value   Potassium 3.2 (*)    Glucose, Bld 133 (*)    BUN 5 (*)    Albumin 3.0 (*)    All other components within normal limits  CBC - Abnormal; Notable for the following:    RBC 3.62 (*)    Hemoglobin 10.5 (*)    HCT 30.0 (*)    RDW 15.6 (*)    Platelets 442 (*)    All other components within normal limits  URINALYSIS, ROUTINE W REFLEX MICROSCOPIC - Abnormal; Notable for the following:    Leukocytes, UA TRACE (*)    Squamous Epithelial / LPF 0-5 (*)    All other components within normal limits  MAGNESIUM - Abnormal; Notable for the following:    Magnesium 1.6 (*)    All other components within normal limits  POC OCCULT BLOOD, ED - Abnormal; Notable for the following:    Fecal Occult Bld POSITIVE (*)    All other components within normal limits    LIPASE, BLOOD    EKG  EKG Interpretation None       Radiology Dg Pelvis 1-2 Views  Result Date: 10/21/2016 CLINICAL DATA:  Left hip and left femur pain. EXAM: PELVIS - 1-2 VIEW COMPARISON:  CT abdomen pelvis dated September 23, 2016. FINDINGS: There is no evidence of pelvic fracture or diastasis. No pelvic bone lesions are seen. IMPRESSION: Negative. Electronically Signed   By: Titus Dubin M.D.   On: 10/21/2016 16:28   Dg Abdomen Acute W/chest  Result Date: 10/21/2016 CLINICAL DATA:  Acute generalized abdominal pain. EXAM: DG ABDOMEN ACUTE W/ 1V CHEST COMPARISON:  CT scan and radiographs of September 23, 2016. FINDINGS: There is no evidence of dilated bowel loops or free intraperitoneal air. Phleboliths are noted in the pelvis. Surgical clips noted the right upper quad. Stable cardiomediastinal silhouette. Mild right midlung subsegmental atelectasis is noted. Left lung is clear. IMPRESSION: No evidence of bowel obstruction or ileus. Right midlung subsegmental atelectasis is noted. Electronically Signed   By: Marijo Conception, M.D.   On: 10/21/2016 16:17   Dg Foot Complete Left  Result Date: 10/21/2016 CLINICAL DATA:  Abdominal pain for 3 days with nausea and vomiting, stop drinking alcohol 1 month ago, has seen bright red blood in her stools, BILATERAL swelling of the feet, history lupus, hypertension, GERD, prior Billroth II surgery EXAM: LEFT FOOT - COMPLETE 3+ VIEW COMPARISON:  None FINDINGS: Osseous demineralization. Joint spaces preserved. No acute fracture, dislocation, or bone destruction. Small plantar calcaneal spur. Mild scattered soft tissue swelling. IMPRESSION: Small plantar calcaneal spur. No acute osseous abnormalities. Electronically Signed   By: Lavonia Dana M.D.   On: 10/21/2016 16:17   Dg Foot Complete Right  Result Date: 10/21/2016 CLINICAL DATA:  Pain. EXAM: RIGHT FOOT COMPLETE - 3+ VIEW COMPARISON:  None. FINDINGS: There is no evidence of fracture or dislocation. There is no  evidence of arthropathy or other focal bone abnormality. Osteopenia. Os trigonum. Small plantar enthesophyte. Soft tissues are unremarkable. IMPRESSION: Negative. Electronically Signed   By: Titus Dubin M.D.   On: 10/21/2016 16:15   Dg Femur Min 2 Views Left  Result Date: 10/21/2016 CLINICAL DATA:  Left femur pain after unwitnessed fall. EXAM: LEFT FEMUR 2 VIEWS COMPARISON:  None. FINDINGS: There is no evidence of fracture or other focal bone lesions. Soft tissues are unremarkable. IMPRESSION: Normal left femur. Electronically Signed   By: Marijo Conception, M.D.   On: 10/21/2016 16:30    Procedures Procedures (including critical care time)  Medications Ordered in ED Medications  ondansetron (ZOFRAN) injection 4 mg (4 mg Intravenous Given 10/21/16 1447)  famotidine (PEPCID) IVPB 20 mg premix (0 mg Intravenous Stopped 10/21/16 1610)  furosemide (LASIX) injection 20 mg (20 mg Intravenous Given 10/21/16 1447)  potassium chloride (KLOR-CON) packet 40 mEq (40 mEq Oral Given 10/21/16 1444)  HYDROmorphone (DILAUDID) injection 1 mg (1 mg Intravenous Given 10/21/16 1509)  magnesium sulfate 2 g in dextrose 5 % 100 mL IVPB (0 g Intravenous Stopped 10/21/16 1643)  HYDROmorphone (DILAUDID) injection 1 mg (1 mg Intravenous Given 10/21/16 1609)     Initial Impression / Assessment and Plan / ED Course  I have reviewed the triage vital signs and the nursing notes.  Pertinent labs & imaging results that were available during my care of the patient were reviewed by me and considered in my medical decision making (see chart for details).    While in Xray, pt fell off her bed.  It is unclear how this occurred as it was unwitnessed.  Pt c/o left hip pain after fall.  Thankfully, xrays were ok.  Low potassium and magnesium are possibly due to hctz, but more likely due to diet.  Pt encouraged to talk with her PCP regarding bp meds and to repeat those labs in a week.  Rectal bleeding likely secondary to internal  hemorrhoids.  She was given rx for anusol and instr to f/u with GI.  Pt felt better after treatment.  She is d/c and knows to return if worse.   Final Clinical Impressions(s) / ED Diagnoses   Final diagnoses:  Generalized abdominal pain  Bright red blood per rectum  Hypokalemia  Hypomagnesemia  Peripheral edema    New Prescriptions Discharge Medication List as of 10/21/2016  4:28 PM    START taking these medications   Details  hydrocortisone (ANUSOL-HC) 2.5 % rectal cream Apply rectally 2 times daily, Print    potassium chloride SA (K-DUR,KLOR-CON) 20 MEQ tablet Take 1 tablet (20 mEq total) by mouth daily., Starting Mon 10/21/2016, Print         Isla Pence, MD 10/22/16 [phone]

## 2016-10-31 ENCOUNTER — Encounter: Payer: Self-pay | Admitting: Internal Medicine

## 2016-11-01 ENCOUNTER — Emergency Department (HOSPITAL_COMMUNITY)
Admission: EM | Admit: 2016-11-01 | Discharge: 2016-11-01 | Disposition: A | Discharge status: Home / Self Care | Payer: Medicare HMO | Attending: Emergency Medicine | Admitting: Emergency Medicine

## 2016-11-01 ENCOUNTER — Encounter (HOSPITAL_COMMUNITY): Payer: Self-pay | Admitting: Emergency Medicine

## 2016-11-01 ENCOUNTER — Emergency Department (HOSPITAL_COMMUNITY): Payer: Medicare HMO

## 2016-11-01 DIAGNOSIS — W1789XA Other fall from one level to another, initial encounter: Secondary | ICD-10-CM | POA: Diagnosis not present

## 2016-11-01 DIAGNOSIS — Z79899 Other long term (current) drug therapy: Secondary | ICD-10-CM | POA: Diagnosis not present

## 2016-11-01 DIAGNOSIS — F17210 Nicotine dependence, cigarettes, uncomplicated: Secondary | ICD-10-CM | POA: Insufficient documentation

## 2016-11-01 DIAGNOSIS — I10 Essential (primary) hypertension: Secondary | ICD-10-CM | POA: Diagnosis not present

## 2016-11-01 DIAGNOSIS — E039 Hypothyroidism, unspecified: Secondary | ICD-10-CM | POA: Insufficient documentation

## 2016-11-01 DIAGNOSIS — R69 Illness, unspecified: Secondary | ICD-10-CM | POA: Diagnosis not present

## 2016-11-01 DIAGNOSIS — M25552 Pain in left hip: Secondary | ICD-10-CM

## 2016-11-01 NOTE — ED Notes (Signed)
Patient transported to X-ray 

## 2016-11-01 NOTE — ED Provider Notes (Signed)
[facility] DEPT Provider Note   CSN: [id number] Arrival date & time: 11/01/16  1839     History   Chief Complaint Chief Complaint  Patient presents with  . Hip Pain    HPI Angel Price is a 51 y.o. female.  HPI  52 year old female history of abdominal abscess, alcohol abuse who presents today complaining of left hip, thigh, and groin pain since she had a fall getting an x-Dwayn [place]k last week. She states that she was on the x-[name] table and her leg cramped up and she rolled off the table landing on her left hip. She states she has had pain since that time. She has been walking but states she has had a shuffling gait. She denies any other injury. She has a numbness, tingling, weakness, or back pain.  Past Medical History:  Diagnosis Date  . Abscess    soft tissue  . Adrenal mass ([place])   . Alcohol abuse   . Anxiety   . Blood transfusion without reported diagnosis   . Chronic abdominal pain   . Chronic wound infection of abdomen   . Colon polyp    colonoscopy 04/2014  . Depression   . Diverticulosis    colonoscopy 04/2014 moderat pan colonic  . Gastritis    EGD 05/2014  . Gastroparesis Nov 2015  . GERD (gastroesophageal reflux disease)   . Hemorrhoid    internal large  . Hiatal hernia   . History of Billroth II operation   . Hypertension   . Lung nodule    CT 02/2014 needs repeat 1 month  . Lung nodule < 6cm on CT 04/25/2014  . Lupus   . Nausea and vomiting    chronic, recurrent  . Schatzki's ring    patent per EGD 04/2014  . Sickle cell trait ([place])   . Suicide attempt ([place])   . 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 Bright red rectal bleeding 08/22/2016  . Hypotension due to blood loss   . Acute GI bleeding 05/24/2016  . Dieulafoy lesion of duodenum   . History of Billroth II operation   . Gastrointestinal hemorrhage 05/22/2016  . Absolute anemia   . Pancreatitis, acute   . Acute pancreatitis  10/01/2015  . Abnormal CT scan of lung 10/01/2015  . Alcohol intoxication ([place])   . Left-sided weakness   . Psychosomatic factor in physical condition   . Upper GI bleed   . Diverticulosis of colon with hemorrhage   . Chronic wound infection of abdomen 12/22/2014  . Thyroid disease 12/22/2014  . Hypertension 12/22/2014  . Ataxia 11/01/2014  . Hemorrhoids 04/26/2014  . Lung nodule 04/26/2014  . Diverticulosis   . Gastritis   . Hiatal hernia   . Schatzki's ring   . Acute blood loss anemia 04/25/2014  . Sinus tachycardia 04/25/2014  . Hypokalemia 04/25/2014  . Hyponatremia 04/25/2014  . Hematemesis with nausea 04/25/2014  . Lung nodule < 6cm on CT 04/25/2014  . Intractable nausea and vomiting 04/24/2014  . Gastroparesis   . Abdominal pain 04/01/2014  . Chronic abdominal pain 01/21/2014  . Gastroenteritis 12/10/2013  . Chronic abdominal wound infection 08/16/2013  . MDD (major depressive disorder), recurrent episode, severe ([place]) 06/27/2013  . Wrist laceration 06/24/2013  . Nausea with vomiting 05/07/2013  . Diarrhea 05/07/2013  . Rectal bleeding 05/07/2013  . Abnormal LFTs 05/07/2013  . Adrenal mass, left ([place]) 05/07/2013  . Abdominal  wall abscess at site of surgical wound 04/19/2013  . Abdominal wall abscess 04/18/2013  . Frequent headaches 03/30/2013  . Sleep difficulties 03/30/2013  . Essential hypertension, benign 03/30/2013  . History of cocaine abuse 03/16/2013  . History of schizoaffective disorder 03/16/2013  . Bipolar disorder ([place]) 03/16/2013  . Personality disorder 03/16/2013  . Tobacco abuse 03/16/2013  . Alcohol abuse 03/16/2013  . Palpitations 03/16/2013  . Poor vision 03/16/2013  . History of gastric bypass 03/16/2013  . Status post hysterectomy with oophorectomy 03/16/2013  . Hypothyroid 03/16/2013  . Lupus ([place]) 03/16/2013    Past Surgical History:  Procedure Laterality Date  . ABDOMINAL HYSTERECTOMY  2013   [place]  . ABDOMINAL SURGERY    .  ADRENALECTOMY Right   . AGILE CAPSULE N/A 01/05/2015   Procedure: AGILE CAPSULE;  Surgeon: Daneil Dolin, MD;  Location: [facility];  Service: Endoscopy;  Laterality: N/A;  0700  . Billroth II procedure      [place], first 2000, 2005/2006.  Marland Kitchen BIOPSY  05/20/2013   Procedure: BIOPSIES OF ASCENDING AND SIGMOID COLON;  Surgeon: Daneil Dolin, MD;  Location: [facility] ORS;  Service: Endoscopy;;  . BIOPSY  04/26/2014   Procedure: BIOPSIES;  Surgeon: Danie Binder, MD;  Location: [facility] OR[facility];  Service: Endoscopy;;  . CHOLECYSTECTOMY    . COLONOSCOPY     in [place]  . COLONOSCOPY WITH PROPOFOL N/A 05/20/2013   Dr.Rourk- inadequate prep, normal appearing rectum, grossly normal colon aside from pancolonic diverticula, normal terminal ileum bx= unremarkable colonic mucosa. Due for early interval 2016.   Marland Kitchen COLONOSCOPY WITH PROPOFOL N/A 04/26/2014   FVC:BSWHQP ileum/one colon polyp removed/moderate pan-colonic diverticulosis/large internal hemorrhoids  . COLONOSCOPY WITH PROPOFOL N/A 12/23/2014   Dr.Rourk- minimal internal hemorrhoids, pancolonic diverticulosis  . COLONOSCOPY WITH PROPOFOL N/A 08/23/2016   Procedure: COLONOSCOPY WITH PROPOFOL;  Surgeon: Danie Binder, MD;  Location: [facility];  Service: Endoscopy;  Laterality: N/A;  . DEBRIDEMENT OF ABDOMINAL WALL ABSCESS N/A 02/08/2013   Procedure: DEBRIDEMENT OF ABDOMINAL WALL ABSCESS;  Surgeon: Jamesetta So, MD;  Location: [facility];  Service: General;  Laterality: N/A;  . ESOPHAGOGASTRODUODENOSCOPY (EGD) WITH PROPOFOL N/A 05/20/2013   Dr.Rourk- s/p prior gastric surgery with normal esophagus, residual gastric mucosa and patent efferent limb  . ESOPHAGOGASTRODUODENOSCOPY (EGD) WITH PROPOFOL N/A 02/03/2014   Dr. Gala Romney:  s/p hemigastrectomy with retained gastric contents. Residual gastric mucosa and efferent limb appeared normal otherwise. Query gastroparesis.   Marland Kitchen ESOPHAGOGASTRODUODENOSCOPY (EGD) WITH PROPOFOL N/A 04/26/2014   RFF:[id number]'D ring/small HH/mild  non-erosive gasrtitis/normal anastomosis  . ESOPHAGOGASTRODUODENOSCOPY (EGD) WITH PROPOFOL N/A 12/23/2014   Dr.Rourk- s/p prior hemigastrctomy, active oozing from anastomotic suture site, hemostasis achieved  . ESOPHAGOGASTRODUODENOSCOPY (EGD) WITH PROPOFOL N/A 05/23/2016   Dr. 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 tendon repar Right    wrist  . WOUND EXPLORATION Right 06/24/2013   Procedure: exploration of traumatic wound right wrist;  Surgeon: Tennis Must, MD;  Location: [facility];  Service: Orthopedics;  Laterality: Right;    OB History    Gravida Para Term Preterm AB Living   4 4 4     3    SAB TAB Ectopic Multiple Live Births                   Home Medications    Prior to Admission medications   Medication Sig Start Date End Date  Taking? Authorizing Provider  cephALEXin (KEFLEX) 500 MG capsule Take 1 capsule (500 mg total) by mouth 4 (four) times daily. 09/23/16  Yes Francine Graven, DO  HYDROcodone-acetaminophen (NORCO/VICODIN) 5-325 MG tablet Take 1 tablet by mouth every 4 (four) hours as needed. 10/21/16  Yes Isla Pence, MD  hydrocortisone (ANUSOL-HC) 2.5 % rectal cream Apply rectally 2 times daily 10/21/16  Yes Isla Pence, MD  lisinopril-hydrochlorothiazide (PRINZIDE,ZESTORETIC) 20-12.5 MG tablet Take 1 tablet by mouth daily.   Yes [provider]  naproxen sodium (ANAPROX) 220 MG tablet Take 220-440 mg by mouth 2 (two) times daily as needed (pain).     [provider]  ondansetron (ZOFRAN ODT) 4 MG disintegrating tablet 4mg  ODT q4 hours prn nausea/vomit Patient not taking: Reported on 10/21/2016 09/17/16   Milton Ferguson, MD  pantoprazole (PROTONIX) 20 MG tablet Take 1 tablet (20 mg total) by mouth daily. 09/23/16   Francine Graven, DO  polyethylene glycol [facility] / GLYCOLAX) packet Take 17 g by mouth daily as needed for moderate constipation or severe  constipation.    [provider]  potassium chloride SA (K-DUR,KLOR-CON) 20 MEQ tablet Take 1 tablet (20 mEq total) by mouth daily. 10/21/16   Isla Pence, MD  promethazine (PHENERGAN) 25 MG tablet Take 1 tablet (25 mg total) by mouth every 6 (six) hours as needed for nausea or vomiting. Patient not taking: Reported on 10/21/2016 09/23/16   Francine Graven, DO  sucralfate (CARAFATE) 1 g tablet Take 1 tablet (1 g total) by mouth 4 (four) times daily -  with meals and at bedtime. 09/17/16   Milton Ferguson, MD  vitamin B-12 (CYANOCOBALAMIN) 100 MCG tablet Take 100 mcg by mouth daily.    [provider]    Family History Family History  Problem Relation Age of Onset  . Brain cancer Son   . Schizophrenia Son   . Cancer Son        brain  . Lung cancer Father   . Cancer Father        mets  . Drug abuse Mother   . Breast cancer Maternal Aunt   . Bipolar disorder Maternal Aunt   . Drug abuse Maternal Aunt   . Colon cancer Maternal Grandmother        late 38s, early 2s  . Drug abuse Sister   . Drug abuse Brother   . Bipolar disorder Paternal Grandfather   . Bipolar disorder Cousin   . Liver disease Neg Hx     Social History Social History  Substance Use Topics  . Smoking status: Current Every Day Smoker    Packs/day: 0.50    Years: 35.00    Types: Cigarettes  . Smokeless tobacco: Never Used  . Alcohol use 0.0 oz/week     Comment: 4 beers daily      Allergies   Codeine; Morphine and related; and Reglan [metoclopramide]   Review of Systems Review of Systems  All other systems reviewed and are negative.    Physical Exam Updated Vital Signs BP 130/84 (BP Location: Right Arm)   Pulse (S) (!) 128   Temp 98.4 F (36.9 C) (Oral)   Resp 20   Ht 1.6 m (5\' 3" )   Wt 62.1 kg (137 lb)   SpO2 99%   BMI 24.27 kg/m   Physical Exam  Constitutional: She is oriented to person, place, and time. She appears well-developed and well-nourished. No distress.  HENT:    Head: Normocephalic and atraumatic.  Right Ear: External  ear normal.  Left Ear: External ear normal.  Nose: Nose normal.  Eyes: Pupils are equal, round, and reactive to light. Conjunctivae and EOM are normal.  Neck: Normal range of motion. Neck supple.  Cardiovascular: Regular rhythm.   Pulmonary/Chest: Effort normal.  Musculoskeletal: Normal range of motion. She exhibits tenderness.       Legs: Neurological: She is alert and oriented to person, place, and time. She exhibits normal muscle tone. Coordination normal.  Skin: Skin is warm and dry.  Psychiatric: She has a normal mood and affect. Her behavior is normal. Thought content normal.  Nursing note and vitals reviewed.    ED Treatments / Results  Labs (all labs ordered are listed, but only abnormal results are displayed) Labs Reviewed - No data to display  EKG  EKG Interpretation  Date/Time:  Friday November 01 2016 19:19:43 EDT Ventricular Rate:  98 PR Interval:    QRS Duration: 76 QT Interval:  379 QTC Calculation: 484 R Axis:   69 Text Interpretation:  Sinus rhythm Confirmed by Pattricia Boss [id number]) on 11/01/2016 7:56:55 PM       Radiology Dg Hip Unilat W Or Wo Pelvis 2-3 Views Left  Result Date: 11/01/2016 CLINICAL DATA:  Left hip pain for 1 week EXAM: DG HIP (WITH OR WITHOUT PELVIS) 2-3V LEFT COMPARISON:  None. FINDINGS: There is no evidence of hip fracture or dislocation. There is no evidence of arthropathy or other focal bone abnormality. IMPRESSION: No acute osseous injury of the left hip. Electronically Signed   By: Kathreen Devoid   On: 11/01/2016 19:34    Procedures Procedures (including critical care time)  Medications Ordered in ED Medications - No data to display   Initial Impression / Assessment and Plan / ED Course  I have reviewed the triage vital signs and the nursing notes.  Pertinent labs & imaging results that were available during my care of the patient were reviewed by me and considered in my  medical decision making (see chart for details).     51 year old female complaining of left hip pain after fall. No evidence of fracture seen on x-Syliva Mee. Doubt fracture as fall occurred greater than one week ago. Patient advised to follow-up with primary care physician.  Final Clinical Impressions(s) / ED Diagnoses   Final diagnoses:  Left hip pain    New Prescriptions New Prescriptions   No medications on file     Pattricia Boss, MD 11/04/16 [phone]

## 2016-11-01 NOTE — ED Triage Notes (Signed)
Pt c/o right hip pain that radiates to the inner thigh. Pt states she has been having pain since falling off the xray table.

## 2016-11-05 ENCOUNTER — Ambulatory Visit: Payer: Medicare HMO | Admitting: General Surgery

## 2016-11-15 DIAGNOSIS — G4700 Insomnia, unspecified: Secondary | ICD-10-CM | POA: Diagnosis not present

## 2016-11-15 DIAGNOSIS — I10 Essential (primary) hypertension: Secondary | ICD-10-CM | POA: Diagnosis not present

## 2016-11-15 DIAGNOSIS — R69 Illness, unspecified: Secondary | ICD-10-CM | POA: Diagnosis not present

## 2016-11-15 DIAGNOSIS — M545 Low back pain: Secondary | ICD-10-CM | POA: Diagnosis not present

## 2016-11-15 DIAGNOSIS — E039 Hypothyroidism, unspecified: Secondary | ICD-10-CM | POA: Diagnosis not present

## 2016-11-22 ENCOUNTER — Emergency Department (HOSPITAL_COMMUNITY): Payer: Medicare HMO

## 2016-11-22 ENCOUNTER — Encounter (HOSPITAL_COMMUNITY): Payer: Self-pay | Admitting: Emergency Medicine

## 2016-11-22 ENCOUNTER — Observation Stay (HOSPITAL_COMMUNITY)
Admission: EM | Admit: 2016-11-22 | Discharge: 2016-11-26 | Disposition: A | Discharge status: Home / Self Care | Payer: Medicare HMO | Attending: Internal Medicine | Admitting: Internal Medicine

## 2016-11-22 DIAGNOSIS — E039 Hypothyroidism, unspecified: Secondary | ICD-10-CM | POA: Insufficient documentation

## 2016-11-22 DIAGNOSIS — R531 Weakness: Secondary | ICD-10-CM

## 2016-11-22 DIAGNOSIS — F17210 Nicotine dependence, cigarettes, uncomplicated: Secondary | ICD-10-CM | POA: Insufficient documentation

## 2016-11-22 DIAGNOSIS — R112 Nausea with vomiting, unspecified: Secondary | ICD-10-CM

## 2016-11-22 DIAGNOSIS — F1411 Cocaine abuse, in remission: Secondary | ICD-10-CM

## 2016-11-22 DIAGNOSIS — M329 Systemic lupus erythematosus, unspecified: Secondary | ICD-10-CM | POA: Diagnosis present

## 2016-11-22 DIAGNOSIS — F1010 Alcohol abuse, uncomplicated: Secondary | ICD-10-CM | POA: Diagnosis present

## 2016-11-22 DIAGNOSIS — F609 Personality disorder, unspecified: Secondary | ICD-10-CM | POA: Diagnosis present

## 2016-11-22 DIAGNOSIS — I10 Essential (primary) hypertension: Secondary | ICD-10-CM | POA: Diagnosis not present

## 2016-11-22 DIAGNOSIS — F319 Bipolar disorder, unspecified: Secondary | ICD-10-CM | POA: Diagnosis not present

## 2016-11-22 DIAGNOSIS — R69 Illness, unspecified: Secondary | ICD-10-CM | POA: Diagnosis not present

## 2016-11-22 DIAGNOSIS — R109 Unspecified abdominal pain: Secondary | ICD-10-CM

## 2016-11-22 DIAGNOSIS — Z8659 Personal history of other mental and behavioral disorders: Secondary | ICD-10-CM | POA: Diagnosis not present

## 2016-11-22 DIAGNOSIS — Z79899 Other long term (current) drug therapy: Secondary | ICD-10-CM | POA: Insufficient documentation

## 2016-11-22 DIAGNOSIS — F54 Psychological and behavioral factors associated with disorders or diseases classified elsewhere: Secondary | ICD-10-CM | POA: Diagnosis present

## 2016-11-22 DIAGNOSIS — R29898 Other symptoms and signs involving the musculoskeletal system: Secondary | ICD-10-CM

## 2016-11-22 DIAGNOSIS — R1013 Epigastric pain: Secondary | ICD-10-CM | POA: Diagnosis not present

## 2016-11-22 DIAGNOSIS — Z9884 Bariatric surgery status: Secondary | ICD-10-CM

## 2016-11-22 DIAGNOSIS — G8929 Other chronic pain: Secondary | ICD-10-CM | POA: Diagnosis present

## 2016-11-22 DIAGNOSIS — M50223 Other cervical disc displacement at C6-C7 level: Secondary | ICD-10-CM | POA: Diagnosis not present

## 2016-11-22 DIAGNOSIS — R29810 Facial weakness: Secondary | ICD-10-CM | POA: Diagnosis not present

## 2016-11-22 DIAGNOSIS — Z87898 Personal history of other specified conditions: Secondary | ICD-10-CM | POA: Insufficient documentation

## 2016-11-22 DIAGNOSIS — M6281 Muscle weakness (generalized): Secondary | ICD-10-CM | POA: Diagnosis not present

## 2016-11-22 DIAGNOSIS — I639 Cerebral infarction, unspecified: Secondary | ICD-10-CM | POA: Diagnosis present

## 2016-11-22 DIAGNOSIS — I6350 Cerebral infarction due to unspecified occlusion or stenosis of unspecified cerebral artery: Secondary | ICD-10-CM | POA: Diagnosis not present

## 2016-11-22 LAB — URINALYSIS, ROUTINE W REFLEX MICROSCOPIC
Bacteria, UA: NONE SEEN
Bilirubin Urine: NEGATIVE
GLUCOSE, UA: NEGATIVE mg/dL
HGB URINE DIPSTICK: NEGATIVE
Ketones, ur: NEGATIVE mg/dL
NITRITE: NEGATIVE
PH: 5 (ref 5.0–8.0)
Protein, ur: NEGATIVE mg/dL
Specific Gravity, Urine: 1.014 (ref 1.005–1.030)

## 2016-11-22 LAB — CBC
HCT: 30.8 % — ABNORMAL LOW (ref 36.0–46.0)
Hemoglobin: 11 g/dL — ABNORMAL LOW (ref 12.0–15.0)
MCH: 28.2 pg (ref 26.0–34.0)
MCHC: 35.7 g/dL (ref 30.0–36.0)
MCV: 79 fL (ref 78.0–100.0)
PLATELETS: 385 10*3/uL (ref 150–400)
RBC: 3.9 MIL/uL (ref 3.87–5.11)
RDW: 15.5 % (ref 11.5–15.5)
WBC: 9.5 10*3/uL (ref 4.0–10.5)

## 2016-11-22 MED ORDER — FENTANYL CITRATE (PF) 100 MCG/2ML IJ SOLN
100.0000 ug | Freq: Once | INTRAMUSCULAR | Status: AC
  Administered 2016-11-22: 100 ug via INTRAVENOUS
  Filled 2016-11-22: qty 2

## 2016-11-22 NOTE — ED Notes (Signed)
Checked with patient for urine sample,patient stated that she can't go right now will try back in 30 minutes

## 2016-11-22 NOTE — ED Provider Notes (Signed)
[facility] DEPT Provider Note   CSN: [id number] Arrival date & time: 11/22/16  1828     History   Chief Complaint Chief Complaint  Patient presents with  . Abdominal Pain  . Extremity Weakness    HPI Angel Price is a 51 y.o. female who presents with Epigastric abdominal pain that began 3 days ago. Patient states that the pain is "sharp" and radiates to the left side and to the back. Patient also reports associated nausea/vomiting states that she has had several episodes of vomiting since onset of symptoms. Emesis is on bloody, nonbilious. Patient states that it feels similar to her previous episodes of pancreatitis. Patient states that her previous episodes of pancreatitis were due to alcohol but patient states that she has not been drinking any alcohol since July 2018. Patient denies any alleviating or aggravating factors. Patient also reports that yesterday morning, she woke up and started experiencing left upper and lower extremity weakness/numbness. Patient reports that she has had difficulty ambulating secondary to symptoms. He states that she feels like she "runs into things." Patient states that she did have some mild chest pain and difficulty breathing yesterday but none now. She states "I just don't feel right." Patient also reports difficulty with word finding. Patient feels like she is having some left sided facial droop but husband  has not noted any facial drooping. Patient denies any fever, chills, dizziness, blood in stool, dysuria, hematuria.    The history is provided by the patient.    Past Medical History:  Diagnosis Date  . Abscess    soft tissue  . Adrenal mass ([place])   . Alcohol abuse   . Anxiety   . Blood transfusion without reported diagnosis   . Chronic abdominal pain   . Chronic wound infection of abdomen   . Colon polyp    colonoscopy 04/2014  . Depression   . Diverticulosis    colonoscopy 04/2014 moderat pan colonic  . Gastritis    EGD 05/2014    . Gastroparesis Nov 2015  . GERD (gastroesophageal reflux disease)   . Hemorrhoid    internal large  . Hiatal hernia   . History of Billroth II operation   . Hypertension   . Lung nodule    CT 02/2014 needs repeat 1 month  . Lung nodule < 6cm on CT 04/25/2014  . Lupus   . Nausea and vomiting    chronic, recurrent  . Schatzki's ring    patent per EGD 04/2014  . Sickle cell trait ([place])   . Suicide attempt ([place])   . 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 CVA (cerebral vascular accident) ([place]) 11/23/2016  . Bright red rectal bleeding 08/22/2016  . Hypotension due to blood loss   . Acute GI bleeding 05/24/2016  . Dieulafoy lesion of duodenum   . History of Billroth II operation   . Gastrointestinal hemorrhage 05/22/2016  . Absolute anemia   . Pancreatitis, acute   . Acute pancreatitis 10/01/2015  . Abnormal CT scan of lung 10/01/2015  . Alcohol intoxication ([place])   . Left-sided weakness   . Psychosomatic factor in physical condition   . Upper GI bleed   . Diverticulosis of colon with hemorrhage   . Chronic wound infection of abdomen 12/22/2014  . Thyroid disease 12/22/2014  . Hypertension 12/22/2014  . Ataxia 11/01/2014  . Hemorrhoids 04/26/2014  . Lung nodule 04/26/2014  .  Diverticulosis   . Gastritis   . Hiatal hernia   . Schatzki's ring   . Acute blood loss anemia 04/25/2014  . Sinus tachycardia 04/25/2014  . Hypokalemia 04/25/2014  . Hyponatremia 04/25/2014  . Hematemesis with nausea 04/25/2014  . Lung nodule < 6cm on CT 04/25/2014  . Intractable nausea and vomiting 04/24/2014  . Gastroparesis   . Abdominal pain 04/01/2014  . Chronic abdominal pain 01/21/2014  . Gastroenteritis 12/10/2013  . Chronic abdominal wound infection 08/16/2013  . MDD (major depressive disorder), recurrent episode, severe ([place]) 06/27/2013  . Wrist laceration 06/24/2013  . Nausea with vomiting 05/07/2013  . Diarrhea  05/07/2013  . Rectal bleeding 05/07/2013  . Abnormal LFTs 05/07/2013  . Adrenal mass, left ([place]) 05/07/2013  . Abdominal wall abscess at site of surgical wound 04/19/2013  . Abdominal wall abscess 04/18/2013  . Frequent headaches 03/30/2013  . Sleep difficulties 03/30/2013  . Essential hypertension, benign 03/30/2013  . History of cocaine abuse 03/16/2013  . History of schizoaffective disorder 03/16/2013  . Bipolar disorder ([place]) 03/16/2013  . Personality disorder 03/16/2013  . Tobacco abuse 03/16/2013  . Alcohol abuse 03/16/2013  . Palpitations 03/16/2013  . Poor vision 03/16/2013  . History of gastric bypass 03/16/2013  . Status post hysterectomy with oophorectomy 03/16/2013  . Hypothyroid 03/16/2013  . Lupus ([place]) 03/16/2013    Past Surgical History:  Procedure Laterality Date  . ABDOMINAL HYSTERECTOMY  2013   [place]  . ABDOMINAL SURGERY    . ADRENALECTOMY Right   . AGILE CAPSULE N/A 01/05/2015   Procedure: AGILE CAPSULE;  Surgeon: Daneil Dolin, MD;  Location: [facility];  Service: Endoscopy;  Laterality: N/A;  0700  . Billroth II procedure      [place], first 2000, 2005/2006.  Marland Kitchen BIOPSY  05/20/2013   Procedure: BIOPSIES OF ASCENDING AND SIGMOID COLON;  Surgeon: Daneil Dolin, MD;  Location: [facility] ORS;  Service: Endoscopy;;  . BIOPSY  04/26/2014   Procedure: BIOPSIES;  Surgeon: Danie Binder, MD;  Location: [facility] OR[facility];  Service: Endoscopy;;  . CHOLECYSTECTOMY    . COLONOSCOPY     in [place]  . COLONOSCOPY WITH PROPOFOL N/A 05/20/2013   Dr.Rourk- inadequate prep, normal appearing rectum, grossly normal colon aside from pancolonic diverticula, normal terminal ileum bx= unremarkable colonic mucosa. Due for early interval 2016.   Marland Kitchen COLONOSCOPY WITH PROPOFOL N/A 04/26/2014   FTD:DUKGUR ileum/one colon polyp removed/moderate pan-colonic diverticulosis/large internal hemorrhoids  . COLONOSCOPY WITH PROPOFOL N/A 12/23/2014   Dr.Rourk- minimal internal hemorrhoids, pancolonic  diverticulosis  . COLONOSCOPY WITH PROPOFOL N/A 08/23/2016   Procedure: COLONOSCOPY WITH PROPOFOL;  Surgeon: Danie Binder, MD;  Location: [facility];  Service: Endoscopy;  Laterality: N/A;  . DEBRIDEMENT OF ABDOMINAL WALL ABSCESS N/A 02/08/2013   Procedure: DEBRIDEMENT OF ABDOMINAL WALL ABSCESS;  Surgeon: Jamesetta So, MD;  Location: [facility] ORS;  Service: General;  Laterality: N/A;  . ESOPHAGOGASTRODUODENOSCOPY (EGD) WITH PROPOFOL N/A 05/20/2013   Dr.Rourk- s/p prior gastric surgery with normal esophagus, residual gastric mucosa and patent efferent limb  . ESOPHAGOGASTRODUODENOSCOPY (EGD) WITH PROPOFOL N/A 02/03/2014   Dr. Gala Romney:  s/p hemigastrectomy with retained gastric contents. Residual gastric mucosa and efferent limb appeared normal otherwise. Query gastroparesis.   Marland Kitchen ESOPHAGOGASTRODUODENOSCOPY (EGD) WITH PROPOFOL N/A 04/26/2014   [facility]:CWCBJSEG'B ring/small HH/mild non-erosive gasrtitis/normal anastomosis  . ESOPHAGOGASTRODUODENOSCOPY (EGD) WITH PROPOFOL N/A 12/23/2014   Dr.Rourk- s/p prior hemigastrctomy, active oozing from anastomotic suture site, hemostasis achieved  . ESOPHAGOGASTRODUODENOSCOPY (EGD) WITH PROPOFOL N/A 05/23/2016  Dr. 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 tendon repar Right    wrist  . WOUND EXPLORATION Right 06/24/2013   Procedure: exploration of traumatic wound right wrist;  Surgeon: Tennis Must, MD;  Location: [facility];  Service: Orthopedics;  Laterality: Right;    OB History    Gravida Para Term Preterm AB Living   4 4 4     3    SAB TAB Ectopic Multiple Live Births                   Home Medications    Prior to Admission medications   Medication Sig Start Date End Date Taking? Authorizing Provider  cephALEXin (KEFLEX) 500 MG capsule Take 1 capsule (500 mg total) by mouth 4 (four) times daily. 09/23/16   Francine Graven, DO  HYDROcodone-acetaminophen  (NORCO/VICODIN) 5-325 MG tablet Take 1 tablet by mouth every 4 (four) hours as needed. 10/21/16   Isla Pence, MD  hydrocortisone (ANUSOL-HC) 2.5 % rectal cream Apply rectally 2 times daily 10/21/16   Isla Pence, MD  lisinopril-hydrochlorothiazide (PRINZIDE,ZESTORETIC) 20-12.5 MG tablet Take 1 tablet by mouth daily.    [provider]  naproxen sodium (ANAPROX) 220 MG tablet Take 220-440 mg by mouth 2 (two) times daily as needed (pain).     [provider]  ondansetron (ZOFRAN ODT) 4 MG disintegrating tablet 4mg  ODT q4 hours prn nausea/vomit Patient not taking: Reported on 10/21/2016 09/17/16   Milton Ferguson, MD  pantoprazole (PROTONIX) 20 MG tablet Take 1 tablet (20 mg total) by mouth daily. 09/23/16   Francine Graven, DO  polyethylene glycol [facility] / GLYCOLAX) packet Take 17 g by mouth daily as needed for moderate constipation or severe constipation.    [provider]  potassium chloride SA (K-DUR,KLOR-CON) 20 MEQ tablet Take 1 tablet (20 mEq total) by mouth daily. 10/21/16   Isla Pence, MD  promethazine (PHENERGAN) 25 MG tablet Take 1 tablet (25 mg total) by mouth every 6 (six) hours as needed for nausea or vomiting. Patient not taking: Reported on 10/21/2016 09/23/16   Francine Graven, DO  sucralfate (CARAFATE) 1 g tablet Take 1 tablet (1 g total) by mouth 4 (four) times daily -  with meals and at bedtime. 09/17/16   Milton Ferguson, MD  vitamin B-12 (CYANOCOBALAMIN) 100 MCG tablet Take 100 mcg by mouth daily.    [provider]    Family History Family History  Problem Relation Age of Onset  . Brain cancer Son   . Schizophrenia Son   . Cancer Son        brain  . Lung cancer Father   . Cancer Father        mets  . Drug abuse Mother   . Breast cancer Maternal Aunt   . Bipolar disorder Maternal Aunt   . Drug abuse Maternal Aunt   . Colon cancer Maternal Grandmother        late 35s, early 25s  . Drug abuse Sister   . Drug abuse Brother   .  Bipolar disorder Paternal Grandfather   . Bipolar disorder Cousin   . Liver disease Neg Hx     Social History Social History  Substance Use Topics  . Smoking status: Current Every Day Smoker    Packs/day: 0.50    Years: 35.00    Types: Cigarettes  . Smokeless tobacco: Never Used  . Alcohol use 0.0  oz/week     Comment: 4 beers daily      Allergies   Codeine; Morphine and related; and Reglan [metoclopramide]   Review of Systems Review of Systems  Constitutional: Negative for chills and fever.  HENT: Negative for congestion.   Eyes: Negative for visual disturbance.  Respiratory: Negative for cough and shortness of breath.   Cardiovascular: Negative for chest pain.  Gastrointestinal: Positive for abdominal pain, nausea and vomiting. Negative for diarrhea.  Genitourinary: Negative for dysuria and hematuria.  Musculoskeletal: Positive for gait problem. Negative for back pain and neck pain.  Skin: Negative for rash.  Neurological: Positive for facial asymmetry, speech difficulty, weakness and numbness. Negative for dizziness and headaches.  Psychiatric/Behavioral: Negative for confusion.     Physical Exam Updated Vital Signs BP 109/82   Pulse 81   Temp 98.7 F (37.1 C) (Oral)   Resp 17   Ht 5\' 3"  (1.6 m)   Wt 62.1 kg (137 lb)   SpO2 95%   BMI 24.27 kg/m   Physical Exam  Constitutional: She is oriented to person, place, and time. She appears well-developed and well-nourished.  Appears uncomfortable but no acute distress   HENT:  Head: Normocephalic and atraumatic.  Mouth/Throat: Oropharynx is clear and moist and mucous membranes are normal.  Horizontal nystagmus noted bilaterally.  Eyes: Pupils are equal, round, and reactive to light. Conjunctivae and lids are normal. Right eye exhibits nystagmus. Left eye exhibits nystagmus.  Neck: Full passive range of motion without pain.  Cardiovascular: Normal rate, regular rhythm, normal heart sounds and normal pulses.  Exam  reveals no gallop and no friction rub.   No murmur heard. Pulmonary/Chest: Effort normal and breath sounds normal.  Abdominal: Soft. Normal appearance. There is tenderness in the epigastric area. There is no rigidity and no guarding.  Abdomen is soft, non-distended. Positive epigastric tenderness.   Musculoskeletal: Normal range of motion.  Neurological: She is alert and oriented to person, place, and time.  Slight left sided facial droop Follows commands 5/5 strength to RUE and RLE, diminished strength of LUE and LLE Decreased sensation to LUE and LLE Positive dysmetria  Positive left dysdiadochokinesia. Positive pronator drift on the left. No slurred speech.   Skin: Skin is warm and dry. Capillary refill takes less than 2 seconds.  Psychiatric: She has a normal mood and affect. Her speech is normal.  Nursing note and vitals reviewed.    ED Treatments / Results  Labs (all labs ordered are listed, but only abnormal results are displayed) Labs Reviewed  COMPREHENSIVE METABOLIC PANEL - Abnormal; Notable for the following:       Result Value   Albumin 3.3 (*)    Total Bilirubin 0.1 (*)    All other components within normal limits  CBC - Abnormal; Notable for the following:    Hemoglobin 11.0 (*)    HCT 30.8 (*)    All other components within normal limits  URINALYSIS, ROUTINE W REFLEX MICROSCOPIC - Abnormal; Notable for the following:    Leukocytes, UA SMALL (*)    Squamous Epithelial / LPF 0-5 (*)    All other components within normal limits  LIPASE, BLOOD  ETHANOL  RAPID URINE DRUG SCREEN, HOSP PERFORMED    EKG  EKG Interpretation  Date/Time:  Friday November 22 2016 23:40:23 EDT Ventricular Rate:  78 PR Interval:  134 QRS Duration: 83 QT Interval:  402 QTC Calculation: 458 R Axis:   73 Text Interpretation:  Sinus rhythm Abnormal R-wave progression, early  transition Baseline wander in lead(s) V5 No significant change was found Confirmed by Ezequiel Essex [phone])  on 11/22/2016 11:47:10 PM       Radiology Ct Head Wo Contrast  Result Date: 11/22/2016 CLINICAL DATA:  Left-sided weakness EXAM: CT HEAD WITHOUT CONTRAST TECHNIQUE: Contiguous axial images were obtained from the base of the skull through the vertex without intravenous contrast. COMPARISON:  03/29/2015 FINDINGS: Brain: No evidence of acute infarction, hemorrhage, hydrocephalus, extra-axial collection or mass lesion/mass effect. Vascular: No hyperdense vessel.  Carotid artery calcification. Skull: Normal. Negative for fracture or focal lesion. Sinuses/Orbits: No acute finding. Other: None IMPRESSION: No CT evidence for acute intracranial abnormality. Electronically Signed   By: Donavan Foil M.D.   On: 11/22/2016 23:29    Procedures Procedures (including critical care time)  Medications Ordered in ED Medications  fentaNYL (SUBLIMAZE) injection 100 mcg (100 mcg Intravenous Given 11/22/16 2331)  sodium chloride 0.9 % bolus 1,000 mL (1,000 mLs Intravenous New Bag/Given 11/23/16 0040)  HYDROmorphone (DILAUDID) injection 1 mg (1 mg Intravenous Given 11/23/16 0040)  promethazine (PHENERGAN) injection 12.5 mg (12.5 mg Intravenous Given 11/23/16 0058)  aspirin tablet 325 mg (325 mg Oral Given 11/23/16 0136)     Initial Impression / Assessment and Plan / ED Course  I have reviewed the triage vital signs and the nursing notes.  Pertinent labs & imaging results that were available during my care of the patient were reviewed by me and considered in my medical decision making (see chart for details).     51 y.o. F who presents with 3 days of abdominal pain. Associated with nausea/vomiting. Patient also reports that yesterday morning she woke up and started having some left upper extremity and left lower extremity weakness/numbness. Patient also feels like she is having difficulty in relating and having some word finding difficulty. Has been noted no slurred speech or facial droop. Patient is afebrile, non-toxic  appearing. Vital signs reviewed and stable. Physical exam shows decreased strength to left upper and left lower extremity. Patient with positive pronator drift, dysdiadochokinesia, abnormal finger to nose. Consider CVA versus ICH given neuro findings. Also consider pancreatitis versus acute infectious etiology. Initial labs ordered triage included a CBC, CMP, lipase, UA. Will add additional CT head and EKG for full evaluation. Since symptoms started yesterday morning, patient is also to the stroke window for TPA consideration. IVF given for fluid resuscitation  Discussed patient with Dr. Dayna Barker. Will evaluate patient.   Records reviewed. There is mention of left sided weakness from 2016 and 2017 appears to be correlated with a fall and left arm injury or with episodes of intoxication. There is mention of questionable left sided weakness but evidence of abnormal coordination, dysmetria, facial droop, or dysdiadochokinesia. During her previous ED visit on 11/01/16 there is mention of normal coordination the neuro exam. Records reviewed from 2016 show that patient was intoxicated and complaining of left sided weakness. During her ED course, patient became sober and returned to baseline. Patient does not appear intoxicated at this time. Will add an ethanol level.    Labs and imaging reviewed. CMP within normal limits. Ethanol negative. Lipase is unremarkable. CBC shows slight anemia. Records reviewed show this consistent with previous. UA is negative for any acute signs of infection. CT head is negative  acute ICH or intracranial mass. Given neuro exam, would likely require admission for stroke workup. Discussed with Dr. Dayna Barker. He is agreeable to plan. Consult to hospitalist placed.   Discussed with hospitalist. Will plan to admit.  Final Clinical Impressions(s) / ED Diagnoses   Final diagnoses:  Weakness of left upper extremity  Epigastric pain  Non-intractable vomiting with nausea, unspecified vomiting  type    New Prescriptions New Prescriptions   No medications on file     Desma Mcgregor 11/23/16 0157    Mesner, Corene Cornea, MD 11/23/16 1620

## 2016-11-22 NOTE — ED Triage Notes (Signed)
Patient complains of abdominal pain, nausea and vomiting x 3 days. Patient states history of pancreatitis.

## 2016-11-22 NOTE — ED Provider Notes (Signed)
Medical screening examination/treatment/procedure(s) were conducted as a shared visit with non-physician practitioner(s) and myself.  I personally evaluated the patient during the encounter.  51 year old female has some type of nondescript abdominal pain yesterday that slowly led to having left arm and leg weakness along with left facial paresthesias. No radiation of this before. On my exam patient's abdomen is benign however she has marked left arm and grip weakness. Does appear to have a slight left facial droop as well. Concern for possible stroke so will likely need admission.   EKG Interpretation  Date/Time:  Friday November 22 2016 23:40:23 EDT Ventricular Rate:  78 PR Interval:  134 QRS Duration: 83 QT Interval:  402 QTC Calculation: 458 R Axis:   73 Text Interpretation:  Sinus rhythm Abnormal R-wave progression, early transition Baseline wander in lead(s) V5 No significant change was found Confirmed by Ezequiel Essex [phone]) on 11/22/2016 11:47:10 PM          Blaine Guiffre, Corene Cornea, MD 11/23/16 1620

## 2016-11-23 ENCOUNTER — Observation Stay (HOSPITAL_COMMUNITY): Payer: Medicare HMO

## 2016-11-23 ENCOUNTER — Encounter (HOSPITAL_COMMUNITY): Payer: Self-pay | Admitting: *Deleted

## 2016-11-23 ENCOUNTER — Observation Stay (HOSPITAL_BASED_OUTPATIENT_CLINIC_OR_DEPARTMENT_OTHER): Payer: Medicare HMO

## 2016-11-23 DIAGNOSIS — F609 Personality disorder, unspecified: Secondary | ICD-10-CM

## 2016-11-23 DIAGNOSIS — I340 Nonrheumatic mitral (valve) insufficiency: Secondary | ICD-10-CM | POA: Diagnosis not present

## 2016-11-23 DIAGNOSIS — R109 Unspecified abdominal pain: Secondary | ICD-10-CM

## 2016-11-23 DIAGNOSIS — I639 Cerebral infarction, unspecified: Secondary | ICD-10-CM

## 2016-11-23 DIAGNOSIS — I10 Essential (primary) hypertension: Secondary | ICD-10-CM

## 2016-11-23 DIAGNOSIS — F54 Psychological and behavioral factors associated with disorders or diseases classified elsewhere: Secondary | ICD-10-CM | POA: Diagnosis not present

## 2016-11-23 DIAGNOSIS — G8929 Other chronic pain: Secondary | ICD-10-CM | POA: Diagnosis not present

## 2016-11-23 DIAGNOSIS — Z87898 Personal history of other specified conditions: Secondary | ICD-10-CM | POA: Diagnosis not present

## 2016-11-23 DIAGNOSIS — F3178 Bipolar disorder, in full remission, most recent episode mixed: Secondary | ICD-10-CM

## 2016-11-23 DIAGNOSIS — R69 Illness, unspecified: Secondary | ICD-10-CM | POA: Diagnosis not present

## 2016-11-23 DIAGNOSIS — R531 Weakness: Secondary | ICD-10-CM

## 2016-11-23 DIAGNOSIS — R29810 Facial weakness: Secondary | ICD-10-CM | POA: Diagnosis not present

## 2016-11-23 DIAGNOSIS — F1010 Alcohol abuse, uncomplicated: Secondary | ICD-10-CM | POA: Diagnosis not present

## 2016-11-23 LAB — LIPID PANEL
CHOLESTEROL: 165 mg/dL (ref 0–200)
HDL: 33 mg/dL — AB (ref >40)
LDL CALC: 99 mg/dL (ref 0–99)
TRIGLYCERIDES: 166 mg/dL — AB (ref <150)
Total CHOL/HDL Ratio: 5 RATIO
VLDL: 33 mg/dL (ref 0–40)

## 2016-11-23 LAB — COMPREHENSIVE METABOLIC PANEL
ALBUMIN: 3.3 g/dL — AB (ref 3.5–5.0)
ALK PHOS: 76 U/L (ref 38–126)
ALT: 14 U/L (ref 14–54)
AST: 19 U/L (ref 15–41)
Anion gap: 6 (ref 5–15)
BUN: 14 mg/dL (ref 6–20)
CALCIUM: 9.4 mg/dL (ref 8.9–10.3)
CHLORIDE: 109 mmol/L (ref 101–111)
CO2: 27 mmol/L (ref 22–32)
CREATININE: 0.62 mg/dL (ref 0.44–1.00)
GFR calc non Af Amer: >60 mL/min (ref >60)
GLUCOSE: 86 mg/dL (ref 65–99)
Potassium: 3.5 mmol/L (ref 3.5–5.1)
SODIUM: 142 mmol/L (ref 135–145)
Total Bilirubin: 0.1 mg/dL — ABNORMAL LOW (ref 0.3–1.2)
Total Protein: 6.8 g/dL (ref 6.5–8.1)

## 2016-11-23 LAB — ECHOCARDIOGRAM COMPLETE
Height: 63 in
Weight: 2174.4 oz

## 2016-11-23 LAB — HEMOGLOBIN A1C
Hgb A1c MFr Bld: 5.1 % (ref 4.8–5.6)
Mean Plasma Glucose: 99.67 mg/dL

## 2016-11-23 LAB — LIPASE, BLOOD: LIPASE: 24 U/L (ref 11–51)

## 2016-11-23 LAB — ETHANOL: Alcohol, Ethyl (B): <5 mg/dL (ref <5)

## 2016-11-23 MED ORDER — ACETAMINOPHEN 160 MG/5ML PO SOLN: 650.0000 mg | ORAL | Status: DC | PRN

## 2016-11-23 MED ORDER — SUCRALFATE 1 G PO TABS
1.0000 g | ORAL_TABLET | Freq: Three times a day (TID) | ORAL | Status: DC
  Administered 2016-11-23 – 2016-11-26 (×10): 1 g via ORAL
  Filled 2016-11-23 (×9): qty 1

## 2016-11-23 MED ORDER — OXYCODONE HCL 5 MG PO TABS: 5.0000 mg | ORAL_TABLET | Freq: Four times a day (QID) | ORAL | Status: DC | PRN

## 2016-11-23 MED ORDER — SODIUM CHLORIDE 0.9 % IV BOLUS (SEPSIS)
1000.0000 mL | Freq: Once | INTRAVENOUS | Status: AC
  Administered 2016-11-23: 1000 mL via INTRAVENOUS

## 2016-11-23 MED ORDER — LORAZEPAM 2 MG/ML IJ SOLN
1.0000 mg | Freq: Once | INTRAMUSCULAR | Status: AC
  Administered 2016-11-23: 1 mg via INTRAVENOUS
  Filled 2016-11-23: qty 1

## 2016-11-23 MED ORDER — ASPIRIN 325 MG PO TABS
325.0000 mg | ORAL_TABLET | Freq: Once | ORAL | Status: AC
  Administered 2016-11-23: 325 mg via ORAL
  Filled 2016-11-23: qty 1

## 2016-11-23 MED ORDER — HYDROMORPHONE HCL 1 MG/ML IJ SOLN
1.0000 mg | Freq: Once | INTRAMUSCULAR | Status: AC
  Administered 2016-11-23: 1 mg via INTRAVENOUS
  Filled 2016-11-23: qty 1

## 2016-11-23 MED ORDER — ACETAMINOPHEN 325 MG PO TABS
650.0000 mg | ORAL_TABLET | ORAL | Status: DC | PRN
  Filled 2016-11-23 (×2): qty 2

## 2016-11-23 MED ORDER — TRAMADOL HCL 50 MG PO TABS
50.0000 mg | ORAL_TABLET | Freq: Four times a day (QID) | ORAL | Status: DC | PRN
  Administered 2016-11-23: 50 mg via ORAL
  Filled 2016-11-23: qty 1

## 2016-11-23 MED ORDER — ACETAMINOPHEN 650 MG RE SUPP: 650.0000 mg | RECTAL | Status: DC | PRN

## 2016-11-23 MED ORDER — STROKE: EARLY STAGES OF RECOVERY BOOK
Freq: Once | Status: DC
  Filled 2016-11-23: qty 1

## 2016-11-23 MED ORDER — ASPIRIN 325 MG PO TABS
325.0000 mg | ORAL_TABLET | Freq: Every day | ORAL | Status: DC
  Administered 2016-11-23: 325 mg via ORAL
  Filled 2016-11-23: qty 1

## 2016-11-23 MED ORDER — ASPIRIN EC 81 MG PO TBEC
81.0000 mg | DELAYED_RELEASE_TABLET | Freq: Every day | ORAL | Status: DC
  Administered 2016-11-23 – 2016-11-25 (×3): 81 mg via ORAL
  Filled 2016-11-23 (×3): qty 1

## 2016-11-23 MED ORDER — ASPIRIN 300 MG RE SUPP: 300.0000 mg | Freq: Every day | RECTAL | Status: DC

## 2016-11-23 MED ORDER — PROMETHAZINE HCL 25 MG/ML IJ SOLN
12.5000 mg | Freq: Once | INTRAMUSCULAR | Status: AC
  Administered 2016-11-23: 12.5 mg via INTRAVENOUS
  Filled 2016-11-23: qty 1

## 2016-11-23 NOTE — Progress Notes (Signed)
  Echocardiogram 2D Echocardiogram has been performed.  Angel Price 11/23/2016, 2:54 PM

## 2016-11-23 NOTE — Evaluation (Signed)
Physical Therapy Evaluation Patient Details Name: Angel Price MRN: [id number] DOB: 21-Mar-1965 Today's Date: 11/23/2016   History of Present Illness  Angel Price is a 51 y.o. female with medical history significant of cocaine abuse, chronic abdominal pain, etoh abuse comes in for abdominal pain which is usual for her but was also incidentally found to have left sided weakness.  Pt reports left side weakness for over 24 hours.  It feels heavy and numb.  She has also having problems swallowing and she reports drooling although no drooling now.  She reports trouble speaking but none obvious now.  No fevers.  Pt referred for admission for concern of stroke.  She hasnever had a stroke before.   Clinical Impression  Attempted evaluation earlier in the morning however patient fast asleep and unable to wake; returned later in morning, now able to wake patient, who was pleasant and willing to participate in PT but somewhat lethargic during PT session. Able to complete bed mobility with min guard-min assist however noted considerable weakness L LE/UE/trunk, patient reports this is new onset. Able to perform sit to stand with Min assist and walker, also required Min assist during gait in room with walker due to multiple LOB to the L. Patient dizzy throughout session with postural transitions such as sit to stand and supine to sit however this improved with extended time in new postural position indicating possible BP involvement. Due to patient's young age and reported high PLOF, recommend CIR moving forward; patient will also continue to receive skilled PT services during her stay at this facility. Patient left in bed with other clinical staff preparing to work with patient; RN aware of patient's mobility/performance with PT, dizziness, and PT recommendations for CIR.     Follow Up Recommendations CIR    Equipment Recommendations  None recommended by PT    Recommendations for Other Services Rehab  consult     Precautions / Restrictions Precautions Precautions: Fall Restrictions Weight Bearing Restrictions: No      Mobility  Bed Mobility Overal bed mobility: Needs Assistance Bed Mobility: Supine to Sit;Sit to Supine     Supine to sit: Min guard Sit to supine: Min assist   General bed mobility comments: trunk weakness noted   Transfers Overall transfer level: Needs assistance Equipment used: Rolling walker (2 wheeled) Transfers: Sit to/from Stand Sit to Stand: Min assist         General transfer comment: cues for safety   Ambulation/Gait Ambulation/Gait assistance: Min assist Ambulation Distance (Feet): 18 Feet Assistive device: Rolling walker (2 wheeled) Gait Pattern/deviations: Step-through pattern;Decreased step length - right;Decreased stance time - left;Decreased dorsiflexion - left;Decreased weight shift to left;Drifts right/left;Narrow base of support     General Gait Details: multiple occasions where patient LOB balance to L requiring Min assist from PT to correct   Stairs            Wheelchair Mobility    Modified Rankin (Stroke Patients Only)       Balance Overall balance assessment: Needs assistance;History of Falls Sitting-balance support: No upper extremity supported Sitting balance-Leahy Scale: Fair     Standing balance support: Bilateral upper extremity supported Standing balance-Leahy Scale: Fair                 High Level Balance Comments: multiple LOB to L during gait requring Min assist to correct              Pertinent Vitals/Pain Pain Assessment: 0-10 Pain Score: 5  Pain Location: L side  Pain Descriptors / Indicators: Aching Pain Intervention(s): Limited activity within patient's tolerance;Repositioned;Monitored during session    Home Living Family/patient expects to be discharged to:: Private residence Living Arrangements: Spouse/significant other Available Help at Discharge: Family Type of Home:  House Home Access: Level entry     Home Layout: One level Home Equipment: None Additional Comments: independent with mobility     Prior Function Level of Independence: Independent               Hand Dominance        Extremity/Trunk Assessment   Upper Extremity Assessment Upper Extremity Assessment: Defer to OT evaluation    Lower Extremity Assessment Lower Extremity Assessment: Generalized weakness    Cervical / Trunk Assessment Cervical / Trunk Assessment: Normal  Communication   Communication: No difficulties  Cognition Arousal/Alertness: Lethargic Behavior During Therapy: WFL for tasks assessed/performed Overall Cognitive Status: Within Functional Limits for tasks assessed                                        General Comments      Exercises     Assessment/Plan    PT Assessment Patient needs continued PT services  PT Problem List Decreased strength;Decreased mobility;Decreased safety awareness;Decreased coordination;Decreased activity tolerance;Decreased balance;Decreased knowledge of use of DME       PT Treatment Interventions DME instruction;Therapeutic activities;Gait training;Therapeutic exercise;Patient/family education;Stair training;Balance training;Functional mobility training;Neuromuscular re-education    PT Goals (Current goals can be found in the Care Plan section)  Acute Rehab PT Goals Patient Stated Goal: to get back to PLOF  PT Goal Formulation: With patient Time For Goal Achievement: 12/07/16 Potential to Achieve Goals: Good    Frequency 7X/week   Barriers to discharge        Co-evaluation               AM-PAC PT "6 Clicks" Daily Activity  Outcome Measure Difficulty turning over in bed (including adjusting bedclothes, sheets and blankets)?: None Difficulty moving from lying on back to sitting on the side of the bed? : A Little Difficulty sitting down on and standing up from a chair with arms (e.g.,  wheelchair, bedside commode, etc,.)?: A Little Help needed moving to and from a bed to chair (including a wheelchair)?: A Little Help needed walking in hospital room?: A Little Help needed climbing 3-5 steps with a railing? : A Lot 6 Click Score: 18    End of Session Equipment Utilized During Treatment: Gait belt Activity Tolerance: Patient tolerated treatment well Patient left: in bed;with call bell/phone within reach Nurse Communication: Mobility status;Other (comment) (CIR recommendations ) PT Visit Diagnosis: Unsteadiness on feet (R26.81);Other abnormalities of gait and mobility (R26.89);Muscle weakness (generalized) (M62.81);History of falling (Z91.81);Difficulty in walking, not elsewhere classified (R26.2);Hemiplegia and hemiparesis Hemiplegia - Right/Left: Left Hemiplegia - caused by: Unspecified    Time: 5188-4166 PT Time Calculation (min) (ACUTE ONLY): 24 min   Charges:   PT Evaluation $PT Eval Low Complexity: 1 Low PT Treatments $Self Care/Home Management: 8-22   PT G Codes:   PT G-Codes **NOT FOR INPATIENT CLASS** Functional Assessment Tool Used: Clinical judgement;AM-PAC 6 Clicks Basic Mobility Functional Limitation: Mobility: Walking and moving around Mobility: Walking and Moving Around Current Status (A6301): At least 40 percent but less than 60 percent impaired, limited or restricted Mobility: Walking and Moving Around Goal Status [id number]): At least 20 percent  but less than 40 percent impaired, limited or restricted    Deniece Ree PT, DPT [phone]

## 2016-11-23 NOTE — Evaluation (Signed)
Clinical/Bedside Swallow Evaluation Patient Details  Name: Angel Price MRN: [id number] Date of Birth: 1965/04/13  Today's Date: 11/23/2016 Time: SLP Start Time (ACUTE ONLY): 1696 SLP Stop Time (ACUTE ONLY): 1227 SLP Time Calculation (min) (ACUTE ONLY): 31 min  Past Medical History:  Past Medical History:  Diagnosis Date  . Abscess    soft tissue  . Adrenal mass ([facility])   . Alcohol abuse   . Anxiety   . Blood transfusion without reported diagnosis   . Chronic abdominal pain   . Chronic wound infection of abdomen   . Colon polyp    colonoscopy 04/2014  . Depression   . Diverticulosis    colonoscopy 04/2014 moderat pan colonic  . Gastritis    EGD 05/2014  . Gastroparesis Nov 2015  . GERD (gastroesophageal reflux disease)   . Hemorrhoid    internal large  . Hiatal hernia   . History of Billroth II operation   . Hypertension   . Lung nodule    CT 02/2014 needs repeat 1 month  . Lung nodule < 6cm on CT 04/25/2014  . Lupus   . Nausea and vomiting    chronic, recurrent  . Schatzki's ring    patent per EGD 04/2014  . Sickle cell trait ([place])   . Suicide attempt ([place])   . Thyroid disease 2000   overactive, radiation   Past Surgical History:  Past Surgical History:  Procedure Laterality Date  . ABDOMINAL HYSTERECTOMY  2013   [place]  . ABDOMINAL SURGERY    . ADRENALECTOMY Right   . AGILE CAPSULE N/A 01/05/2015   Procedure: AGILE CAPSULE;  Surgeon: Daneil Dolin, MD;  Location: [facility];  Service: Endoscopy;  Laterality: N/A;  0700  . Billroth II procedure      [place], first 2000, 2005/2006.  Marland Kitchen BIOPSY  05/20/2013   Procedure: BIOPSIES OF ASCENDING AND SIGMOID COLON;  Surgeon: Daneil Dolin, MD;  Location: [facility];  Service: Endoscopy;;  . BIOPSY  04/26/2014   Procedure: BIOPSIES;  Surgeon: Danie Binder, MD;  Location: [facility];  Service: Endoscopy;;  . CHOLECYSTECTOMY    . COLONOSCOPY     in [place]  . COLONOSCOPY WITH PROPOFOL N/A 05/20/2013   Dr.Rourk- inadequate  prep, normal appearing rectum, grossly normal colon aside from pancolonic diverticula, normal terminal ileum bx= unremarkable colonic mucosa. Due for early interval 2016.   Marland Kitchen COLONOSCOPY WITH PROPOFOL N/A 04/26/2014   VEL:FYBOFB ileum/one colon polyp removed/moderate pan-colonic diverticulosis/large internal hemorrhoids  . COLONOSCOPY WITH PROPOFOL N/A 12/23/2014   Dr.Rourk- minimal internal hemorrhoids, pancolonic diverticulosis  . COLONOSCOPY WITH PROPOFOL N/A 08/23/2016   Procedure: COLONOSCOPY WITH PROPOFOL;  Surgeon: Danie Binder, MD;  [facility]: [facility];  Service: Endoscopy;  Laterality: N/A;  . DEBRIDEMENT OF ABDOMINAL WALL ABSCESS N/A 02/08/2013   Procedure: DEBRIDEMENT OF ABDOMINAL WALL ABSCESS;  Surgeon: Jamesetta So, MD;  Location: [facility];  Service: General;  Laterality: N/A;  . ESOPHAGOGASTRODUODENOSCOPY (EGD) WITH PROPOFOL N/A 05/20/2013   Dr.Rourk- s/p prior gastric surgery with normal esophagus, residual gastric mucosa and patent efferent limb  . ESOPHAGOGASTRODUODENOSCOPY (EGD) WITH PROPOFOL N/A 02/03/2014   Dr. Gala Romney:  s/p hemigastrectomy with retained gastric contents. Residual gastric mucosa and efferent limb appeared normal otherwise. Query gastroparesis.   Marland Kitchen ESOPHAGOGASTRODUODENOSCOPY (EGD) WITH PROPOFOL N/A 04/26/2014   PZW:CHENIDPO'E ring/small HH/mild non-erosive gasrtitis/normal anastomosis  . ESOPHAGOGASTRODUODENOSCOPY (EGD) WITH PROPOFOL N/A 12/23/2014   Dr.Rourk- s/p prior hemigastrctomy, active oozing from anastomotic suture site, hemostasis  achieved  . ESOPHAGOGASTRODUODENOSCOPY (EGD) WITH PROPOFOL N/A 05/23/2016   Dr. 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 tendon repar Right    wrist  . WOUND EXPLORATION Right 06/24/2013   Procedure: exploration of traumatic wound right wrist;  Surgeon: Tennis Must, MD;  Location: [facility];  Service: Orthopedics;  Laterality: Right;    HPI:  Angel Price is a 51 y.o. female with medical history significant of cocaine abuse, chronic abdominal pain, etoh abuse comes in for abdominal pain which is usual for her but was also incidentally found to have left sided weakness.  Pt reports left side weakness for over 24 hours.  It feels heavy and numb.  She has also having problems swallowing and she reports drooling although no drooling now.  She reports trouble speaking but none obvious now.  No fevers.  Pt referred for admission for concern of stroke.  She has never had a stroke before.    Assessment / Plan / Recommendation Clinical Impression  Pt was evaluated by means of a clinical swallowing evaluation at bedside presenting with moderate oral and mild pharyngeal phase dysphagia.Oral mechanism exam revealed decreased Left facial ROM and decreased lingual Left ROM and strength. Pt consumed ice chips and thin liquids via tsp, cup and straw with no overt s/sx of aspiration although note effortful swallowing and a slightly delayed swallow trigger. Pt reports it is "hard to swallow". Pt consumed puree textures with a prolonged oral prep stage characterized by decreased coordination and lethargic/slow articulators. Pt consumed regular textures with prolonged A to P transit time, effortful mastication and mild oral residue/pocketing on the Left portion of the lingual surface and mild left lateral sulci pocketing.  Recommend pt utilize a slow consumption rate, alternate bites and sips, utilize lingual sweep on the left after every bite and always be seated at 90 degrees for all PO intake. Pt does have increased aspiration risk d/t left facial/oral weakness and decreased coordination. Recommend D2/ground diet and to continue with thin liquids; straws ok. Recommend meds whole in applesauce. Pt will benefit from continued Speech interventions for dysphagia and note order for cognitive evaluation; await results of MRI to complete cognitive  screen/evaluation. SLP Visit Diagnosis: Dysphagia, oropharyngeal phase (R13.12)    Aspiration Risk  Mild aspiration risk;Moderate aspiration risk    Diet Recommendation Dysphagia 2 (Fine chop);Thin liquid   Liquid Administration via: Cup;Straw Medication Administration: Whole meds with puree Supervision: Patient able to self feed;Intermittent supervision to cue for compensatory strategies Compensations: Minimize environmental distractions;Slow rate;Small sips/bites;Lingual sweep for clearance of pocketing;Multiple dry swallows after each bite/sip;Follow solids with liquid;Clear throat intermittently Postural Changes: Seated upright at 90 degrees;Remain upright for at least 30 minutes after po intake    Other  Recommendations Oral Care Recommendations: Oral care BID   Follow up Recommendations Inpatient Rehab      Frequency and Duration min 2x/week  2 weeks       Prognosis Prognosis for Safe Diet Advancement: Good      Swallow Study   General Date of Onset: 11/22/16 HPI: Angel Price is a 51 y.o. female with medical history significant of cocaine abuse, chronic abdominal pain, etoh abuse comes in for abdominal pain which is usual for her but was also incidentally found to have left sided weakness.  Pt reports left side weakness for over 24 hours.  It feels heavy and numb.  She has also  having problems swallowing and she reports drooling although no drooling now.  She reports trouble speaking but none obvious now.  No fevers.  Pt referred for admission for concern of stroke.  She hasnever had a stroke before.  Type of Study: Bedside Swallow Evaluation Previous Swallow Assessment: none Diet Prior to this Study: NPO Temperature Spikes Noted: No Respiratory Status: Room air History of Recent Intubation: No Behavior/Cognition: Alert;Lethargic/Drowsy Oral Cavity Assessment: Within Functional Limits Oral Cavity - Dentition: Adequate natural dentition Vision: Functional for  self-feeding Self-Feeding Abilities: Able to feed self Patient Positioning: Upright in bed Baseline Vocal Quality: Breathy;Hoarse Volitional Cough: Strong Volitional Swallow: Able to elicit    Oral/Motor/Sensory Function Overall Oral Motor/Sensory Function: Mild impairment Facial ROM: Reduced left Facial Symmetry: Abnormal symmetry left Facial Strength: Reduced left Facial Sensation: Reduced left Lingual ROM: Reduced left Lingual Symmetry: Abnormal symmetry left Lingual Strength: Reduced Lingual Sensation: Reduced   Ice Chips Ice chips: Within functional limits   Thin Liquid Thin Liquid: Within functional limits Presentation: Cup;Self Fed;Straw    Nectar Thick Nectar Thick Liquid: Not tested   Honey Thick Honey Thick Liquid: Not tested   Puree Puree: Impaired Presentation: Self Fed;Spoon Oral Phase Impairments: Reduced lingual movement/coordination;Poor awareness of bolus Oral Phase Functional Implications: Left lateral sulci pocketing Pharyngeal Phase Impairments: Suspected delayed Swallow   Solid   GO             Amelia H. Roddie Mc, CCC-SLP Speech Language Pathologist  Solid: Impaired Presentation: Self Fed Oral Phase Impairments: Reduced lingual movement/coordination Oral Phase Functional Implications: Left lateral sulci pocketing;Prolonged oral transit;Oral residue    Functional Assessment Tool Used: clinical judgement Functional Limitations: Swallowing Swallow Current Status (Q3009): At least 20 percent but less than 40 percent impaired, limited or restricted Swallow Goal Status [phone]): At least 1 percent but less than 20 percent impaired, limited or restricted Swallow Discharge Status [phone]): At least 1 percent but less than 20 percent impaired, limited or restricted   Wende Bushy 11/23/2016,12:40 PM

## 2016-11-23 NOTE — H&P (Signed)
History and Physical    Angel Price DGU:[id number] DOB: 13-Apr-1965 DOA: 11/22/2016  PCP: Rosita Fire, MD  Patient coming from: home  Chief Complaint:  abd pain  HPI: Angel Price is a 51 y.o. female with medical history significant of cocaine abuse, chronic abdominal pain, etoh abuse comes in for abdominal pain which is usual for her but was also incidentally found to have left sided weakness.  Pt reports left side weakness for over 24 hours.  It feels heavy and numb.  She has also having problems swallowing and she reports drooling although no drooling now.  She reports trouble speaking but none obvious now.  No fevers.  Pt referred for admission for concern of stroke.  She hasnever had a stroke before.    Review of Systems: As per HPI otherwise 10 point review of systems negative.   Past Medical History:  Diagnosis Date  . Abscess    soft tissue  . Adrenal mass ([place])   . Alcohol abuse   . Anxiety   . Blood transfusion without reported diagnosis   . Chronic abdominal pain   . Chronic wound infection of abdomen   . Colon polyp    colonoscopy 04/2014  . Depression   . Diverticulosis    colonoscopy 04/2014 moderat pan colonic  . Gastritis    EGD 05/2014  . Gastroparesis Nov 2015  . GERD (gastroesophageal reflux disease)   . Hemorrhoid    internal large  . Hiatal hernia   . History of Billroth II operation   . Hypertension   . Lung nodule    CT 02/2014 needs repeat 1 month  . Lung nodule < 6cm on CT 04/25/2014  . Lupus   . Nausea and vomiting    chronic, recurrent  . Schatzki's ring    patent per EGD 04/2014  . Sickle cell trait ([place])   . Suicide attempt ([place])   . 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 ABDOMINAL HYSTERECTOMY  2013   [place]  . ABDOMINAL SURGERY    . ADRENALECTOMY Right   . AGILE CAPSULE N/A 01/05/2015   Procedure: AGILE CAPSULE;  Surgeon: Daneil Dolin, MD;  Location: [facility];  Service: Endoscopy;  Laterality: N/A;  0700  . Billroth II procedure      [place], first 2000, 2005/2006.  Marland Kitchen BIOPSY  05/20/2013   Procedure: BIOPSIES OF ASCENDING AND SIGMOID COLON;  Surgeon: Daneil Dolin, MD;  Location: [facility] ORS;  Service: Endoscopy;;  . BIOPSY  04/26/2014   Procedure: BIOPSIES;  Surgeon: Danie Binder, MD;  Location: [facility];  Service: Endoscopy;;  . CHOLECYSTECTOMY    . COLONOSCOPY     in [place]  . COLONOSCOPY WITH PROPOFOL N/A 05/20/2013   Dr.Rourk- inadequate prep, normal appearing rectum, grossly normal colon aside from pancolonic diverticula, normal terminal ileum bx= unremarkable colonic mucosa. Due for early interval 2016.   Marland Kitchen COLONOSCOPY WITH PROPOFOL N/A 04/26/2014   ZDG:LOVFIE ileum/one colon polyp removed/moderate pan-colonic diverticulosis/large internal hemorrhoids  . COLONOSCOPY WITH PROPOFOL N/A 12/23/2014   Dr.Rourk- minimal internal hemorrhoids, pancolonic diverticulosis  . COLONOSCOPY WITH PROPOFOL N/A 08/23/2016   Procedure: COLONOSCOPY WITH PROPOFOL;  Surgeon: Danie Binder, MD;  Location: [facility];  Service: Endoscopy;  Laterality: N/A;  . DEBRIDEMENT OF ABDOMINAL WALL ABSCESS N/A 02/08/2013   Procedure: DEBRIDEMENT OF ABDOMINAL WALL ABSCESS;  Surgeon: Jamesetta So, MD;  Location: [facility]  ORS;  Service: General;  Laterality: N/A;  . ESOPHAGOGASTRODUODENOSCOPY (EGD) WITH PROPOFOL N/A 05/20/2013   Dr.Rourk- s/p prior gastric surgery with normal esophagus, residual gastric mucosa and patent efferent limb  . ESOPHAGOGASTRODUODENOSCOPY (EGD) WITH PROPOFOL N/A 02/03/2014   Dr. Gala Romney:  s/p hemigastrectomy with retained gastric contents. Residual gastric mucosa and efferent limb appeared normal otherwise. Query gastroparesis.   Marland Kitchen ESOPHAGOGASTRODUODENOSCOPY (EGD) WITH PROPOFOL N/A 04/26/2014   ZOX:WRUEAVWU'[id number] ring/small HH/mild non-erosive gasrtitis/normal anastomosis  . ESOPHAGOGASTRODUODENOSCOPY (EGD) WITH PROPOFOL N/A 12/23/2014   Dr.Rourk- s/p prior  hemigastrctomy, active oozing from anastomotic suture site, hemostasis achieved  . ESOPHAGOGASTRODUODENOSCOPY (EGD) WITH PROPOFOL N/A 05/23/2016   Dr. 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 tendon repar Right    wrist  . WOUND EXPLORATION Right 06/24/2013   Procedure: exploration of traumatic wound right wrist;  Surgeon: Tennis Must, MD;  Location: [facility];  Service: Orthopedics;  Laterality: Right;     reports that she has been smoking Cigarettes.  She has a 17.50 pack-year smoking history. She has never used smokeless tobacco. She reports that she drinks alcohol. She reports that she does not use drugs.  Allergies  Allergen Reactions  . Codeine Hives  . Morphine And Related Hives  . Reglan [Metoclopramide] Other (See Comments)    EPS symptoms     Family History  Problem Relation Age of Onset  . Brain cancer Son   . Schizophrenia Son   . Cancer Son        brain  . Lung cancer Father   . Cancer Father        mets  . Drug abuse Mother   . Breast cancer Maternal Aunt   . Bipolar disorder Maternal Aunt   . Drug abuse Maternal Aunt   . Colon cancer Maternal Grandmother        late 5s, early 91s  . Drug abuse Sister   . Drug abuse Brother   . Bipolar disorder Paternal Grandfather   . Bipolar disorder Cousin   . Liver disease Neg Hx     Prior to Admission medications   Medication Sig Start Date End Date Taking? Authorizing Provider  cephALEXin (KEFLEX) 500 MG capsule Take 1 capsule (500 mg total) by mouth 4 (four) times daily. 09/23/16   Francine Graven, DO  HYDROcodone-acetaminophen (NORCO/VICODIN) 5-325 MG tablet Take 1 tablet by mouth every 4 (four) hours as needed. 10/21/16   Isla Pence, MD  hydrocortisone (ANUSOL-HC) 2.5 % rectal cream Apply rectally 2 times daily 10/21/16   Isla Pence, MD  lisinopril-hydrochlorothiazide (PRINZIDE,ZESTORETIC) 20-12.5 MG tablet Take 1  tablet by mouth daily.    [provider]  naproxen sodium (ANAPROX) 220 MG tablet Take 220-440 mg by mouth 2 (two) times daily as needed (pain).     [provider]  ondansetron (ZOFRAN ODT) 4 MG disintegrating tablet 4mg  ODT q4 hours prn nausea/vomit Patient not taking: Reported on 10/21/2016 09/17/16   Milton Ferguson, MD  pantoprazole (PROTONIX) 20 MG tablet Take 1 tablet (20 mg total) by mouth daily. 09/23/16   Francine Graven, DO  polyethylene glycol [facility] / GLYCOLAX) packet Take 17 g by mouth daily as needed for moderate constipation or severe constipation.    [provider]  potassium chloride SA (K-DUR,KLOR-CON) 20 MEQ tablet Take 1 tablet (20 mEq total) by mouth daily. 10/21/16   Isla Pence, MD  promethazine (PHENERGAN) 25 MG tablet Take  1 tablet (25 mg total) by mouth every 6 (six) hours as needed for nausea or vomiting. Patient not taking: Reported on 10/21/2016 09/23/16   Francine Graven, DO  sucralfate (CARAFATE) 1 g tablet Take 1 tablet (1 g total) by mouth 4 (four) times daily -  with meals and at bedtime. 09/17/16   Milton Ferguson, MD  vitamin B-12 (CYANOCOBALAMIN) 100 MCG tablet Take 100 mcg by mouth daily.    [provider]    Physical Exam: Vitals:   11/23/16 0020 11/23/16 0030 11/23/16 0100 11/23/16 0200  BP: 96/75 106/80 109/82 103/74  Pulse: 65 72 81 70  Resp: 20 16 17 16   Temp:    98.1 F (36.7 C)  TempSrc:    Oral  SpO2: 98% 97% 95% 99%  Weight:    61.6 kg (135 lb 14.4 oz)  Height:    5\' 3"  (1.6 m)      Constitutional: NAD, calm, comfortable Vitals:   11/23/16 0020 11/23/16 0030 11/23/16 0100 11/23/16 0200  BP: 96/75 106/80 109/82 103/74  Pulse: 65 72 81 70  Resp: 20 16 17 16   Temp:    98.1 F (36.7 C)  TempSrc:    Oral  SpO2: 98% 97% 95% 99%  Weight:    61.6 kg (135 lb 14.4 oz)  Height:    5\' 3"  (1.6 m)   Eyes: PERRL, lids and conjunctivae normal ENMT: Mucous membranes are moist. Posterior pharynx clear of any  exudate or lesions.Normal dentition.  Neck: normal, supple, no masses, no thyromegaly Respiratory: clear to auscultation bilaterally, no wheezing, no crackles. Normal respiratory effort. No accessory muscle use.  Cardiovascular: Regular rate and rhythm, no murmurs / rubs / gallops. No extremity edema. 2+ pedal pulses. No carotid bruits.  Abdomen: no tenderness, no masses palpated. No hepatosplenomegaly. Bowel sounds positive.  Musculoskeletal: no clubbing / cyanosis. No joint deformity upper and lower extremities. Good ROM, no contractures. Normal muscle tone.  Skin: no rashes, lesions, ulcers. No induration Neurologic: CN 2-12 grossly intact. Sensation intact, DTR normal. Strength 5/5 in on right, left side 3/5 strength Psychiatric: Normal judgment and insight. Alert and oriented x 3. Normal mood.    Labs on Admission: I have personally reviewed following labs and imaging studies  CBC:  Recent Labs Lab 11/22/16 1837  WBC 9.5  HGB 11.0*  HCT 30.8*  MCV 79.0  PLT 606   Basic Metabolic Panel:  Recent Labs Lab 11/22/16 2258  NA 142  K 3.5  CL 109  CO2 27  GLUCOSE 86  BUN 14  CREATININE 0.62  CALCIUM 9.4   GFR: Estimated Creatinine Clearance: 70.4 mL/min (by C-G formula based on SCr of 0.62 mg/dL). Liver Function Tests:  Recent Labs Lab 11/22/16 2258  AST 19  ALT 14  ALKPHOS 76  BILITOT 0.1*  PROT 6.8  ALBUMIN 3.3*    Recent Labs Lab 11/22/16 2258  LIPASE 24   Urine analysis:    Component Value Date/Time   COLORURINE YELLOW 11/22/2016 2315   APPEARANCEUR CLEAR 11/22/2016 2315   LABSPEC 1.014 11/22/2016 2315   PHURINE 5.0 11/22/2016 2315   GLUCOSEU NEGATIVE 11/22/2016 2315   HGBUR NEGATIVE 11/22/2016 2315   BILIRUBINUR NEGATIVE 11/22/2016 2315   KETONESUR NEGATIVE 11/22/2016 2315   PROTEINUR NEGATIVE 11/22/2016 2315   UROBILINOGEN 0.2 11/26/2014 0915   NITRITE NEGATIVE 11/22/2016 2315   LEUKOCYTESUR SMALL (A) 11/22/2016 2315     Radiological  Exams on Admission: Ct Head Wo Contrast  Result Date: 11/22/2016 CLINICAL  DATA:  Left-sided weakness EXAM: CT HEAD WITHOUT CONTRAST TECHNIQUE: Contiguous axial images were obtained from the base of the skull through the vertex without intravenous contrast. COMPARISON:  03/29/2015 FINDINGS: Brain: No evidence of acute infarction, hemorrhage, hydrocephalus, extra-axial collection or mass lesion/mass effect. Vascular: No hyperdense vessel.  Carotid artery calcification. Skull: Normal. Negative for fracture or focal lesion. Sinuses/Orbits: No acute finding. Other: None IMPRESSION: No CT evidence for acute intracranial abnormality. Electronically Signed   By: Donavan Foil M.D.   On: 11/22/2016 23:29    EKG: Independently reviewed. nsr Old chart reviewed Case discussed with edp   Assessment/Plan 51 yo female with left sided weakness concernign for CVA  Principal Problem:   CVA (cerebral vascular accident) ([place])- aspirin.  Obtain mri, carotid doppler and cardiac echo in am.  cva pathway.   Swallow eval.  freq neuro cks.  Active Problems:   Alcohol abuse- noted   History of cocaine abuse- uds is pending   History of schizoaffective disorder- noted   Bipolar disorder ([place])- noted   Personality disorder- noted   History of gastric bypass- stable   Lupus ([place])- stable   Essential hypertension, benign- stable   Chronic abdominal pain- stable   Left-sided weakness- as above, evaluate for stroke   Psychosomatic factor in physical condition- noted   Med rec pending   DVT prophylaxis:  scds Code Status:  full Family Communication:  none Disposition Plan:  Per day team Consults called:  none Admission status:  observation   Coltan Spinello A MD [facility]  If 7PM-7AM, please contact night-coverage [email or web address] Password [email or web address]  11/23/2016, 2:28 AM

## 2016-11-23 NOTE — Progress Notes (Signed)
PT Cancellation Note  Patient Details Name: Angel Price MRN: [id number] DOB: [birth year]   Cancelled Treatment:    Attempted evaluation however patient sound asleep and unable to wake patient on multiple attempts; plan to try back later this morning.   Deniece Ree PT, DPT [phone]

## 2016-11-23 NOTE — Progress Notes (Signed)
[facility] Team 2  Angel Price  DQQ:[id number] DOB: 04-03-1965 DOA: 11/22/2016 PCP: Rosita Fire, MD    Brief Narrative:  51 y.o. female with a hx of bipolar d/o, cocaine abuse, chronic abdominal pain, and EtOH abuse who presented to the ED c/o her chronic abdominal pain, but was also incidentally found to have left sided weakness, which she stated had been present over 24 hours.  She also reported recent difficulty w/ speaking an swallowing, though these sx were not present in the ED.    Subjective: Pt is seen for a f/u visit.    Assessment & Plan:  L sided weakness MRI brain 11/23/16 unrevealing, w/ no evidence of CVA or other acute abnormality - B carotid dopplers normal - pt reports sx persist   Chronic abdom pain LFTs normal - lipase normal - pt requesting narcotics - utilize APAP and ultram w/ small dose infrequent oral narcotic as a last resort   EtOH abuse  Cocaine abuse   Bipolar D/O  Hx of gastric bypass  HTN  Normocytic anemia   DVT prophylaxis: SCDs Code Status: FULL CODE Family Communication: no family present at time of exam  Disposition Plan: begin to mobilize   Consultants:  none  Procedures: none  Antimicrobials:  none   Objective: Blood pressure 123/86, pulse 67, temperature 98.2 F (36.8 C), resp. rate 18, height 5\' 3"  (1.6 m), weight 61.6 kg (135 lb 14.4 oz), SpO2 100 %.  Intake/Output Summary (Last 24 hours) at 11/23/16 1444 Last data filed at 11/23/16 1300  Gross per 24 hour  Intake              120 ml  Output                0 ml  Net              120 ml   Filed Weights   11/22/16 1836 11/23/16 0200  Weight: 62.1 kg (137 lb) 61.6 kg (135 lb 14.4 oz)    Examination: Pt was seen for a f/u visit.    CBC:  Recent Labs Lab 11/22/16 1837  WBC 9.5  HGB 11.0*  HCT 30.8*  MCV 79.0  PLT 194   Basic Metabolic Panel:  Recent Labs Lab 11/22/16 2258  NA 142  K 3.5  CL 109  CO2 27  GLUCOSE 86  BUN 14  CREATININE 0.62  CALCIUM  9.4   GFR: Estimated Creatinine Clearance: 70.4 mL/min (by C-G formula based on SCr of 0.62 mg/dL).  Liver Function Tests:  Recent Labs Lab 11/22/16 2258  AST 19  ALT 14  ALKPHOS 76  BILITOT 0.1*  PROT 6.8  ALBUMIN 3.3*    Recent Labs Lab 11/22/16 2258  LIPASE 24    HbA1C: Hgb A1c MFr Bld  Date/Time Value Ref Range Status  11/23/2016 07:59 AM 5.1 4.8 - 5.6 % Final    Comment:    (NOTE) Pre diabetes:          5.7%-6.4% Diabetes:              >6.4% Glycemic control for   <7.0% adults with diabetes   03/16/2013 11:56 AM 5.0 <5.7 % Final    Comment:  According to the ADA Clinical Practice Recommendations for 2011, when HbA1c is used as a screening test:     >=6.5%   Diagnostic of Diabetes Mellitus            (if abnormal result is confirmed)   5.7-6.4%   Increased risk of developing Diabetes Mellitus   References:Diagnosis and Classification of Diabetes Mellitus,Diabetes YEBX,4356,86(HUOHF 1):S62-S69 and Standards of Medical Care in         Diabetes - 2011,Diabetes GBMS,1115,52 (Suppl 1):S11-S61.      Scheduled Meds: .  stroke: mapping our early stages of recovery book   Does not apply Once  . aspirin  300 mg Rectal Daily   Or  . aspirin  325 mg Oral Daily  . sucralfate  1 g Oral TID WC & HS     LOS: 0 days   Time spent: No Charge  Cherene Altes, MD [facility] Office  [phone] Pager - Text Page per Amion as per below:  On-Call/Text Page:      [email or web address]      password [email or web address]  If 7PM-7AM, please contact night-coverage [email or web address] Password [facility] 11/23/2016, 2:44 PM

## 2016-11-23 NOTE — Evaluation (Signed)
Occupational Therapy Evaluation Patient Details Name: Angel Price MRN: [id number] DOB: 25-Sep-1965 Today's Date: 11/23/2016    History of Present Illness Angel Price is a 51 y.o. female with medical history significant of cocaine abuse, chronic abdominal pain, etoh abuse comes in for abdominal pain which is usual for her but was also incidentally found to have left sided weakness.  Pt reports left side weakness for over 24 hours.  It feels heavy and numb.  She has also having problems swallowing and she reports drooling although no drooling now.  She reports trouble speaking but none obvious now.  No fevers.  Pt referred for admission for concern of stroke.  No hx of CVA. MRI and CT negative for acute infarct.    Clinical Impression   Pt attempting to stand up and family helping to bathroom on OT arrival in room. Pt requiring min assist and RW for functional mobility to bathroom and toilet transfer. Educated pt on importance of calling nursing staff prior to attempting to get up and walk to bathroom. During evaluation pt with significant LUE weakness and coordination deficits. Pt demonstrates inconsistent sensation deficits with light touch along forearm, wrist, and hand. Majority of deficits appeared to be along ulnar nerve regions, however occasional deficits randomly along hand and palm with multiple trials in each area. Pt displays left visual field cut with formal testing, unable to test further this date with functional mobility or task completion due to pt lethargy and dizziness.  Recommend CIR on discharge considering pt's age and pt was independent with all B/IADL tasks and functional mobility PTA.     Follow Up Recommendations  CIR    Equipment Recommendations  None recommended by OT    Recommendations for Other Services Rehab consult     Precautions / Restrictions Precautions Precautions: Fall Restrictions Weight Bearing Restrictions: No      Mobility Bed  Mobility Overal bed mobility: Needs Assistance Bed Mobility: Sit to Supine     Supine to sit: Min guard Sit to supine: Min guard   General bed mobility comments: Pt used RLE to bring LLE into bed.   Transfers Overall transfer level: Needs assistance Equipment used: Rolling walker (2 wheeled) Transfers: Sit to/from Stand Sit to Stand: Min assist         General transfer comment: cues for safety         ADL either performed or assessed with clinical judgement   ADL Overall ADL's : Needs assistance/impaired     Grooming: Wash/dry hands;Minimal assistance;Standing Grooming Details (indicate cue type and reason): Min assist to manage LUE as strength and activity tolerance are poor.  Upper Body Bathing: Moderate assistance;Sitting Upper Body Bathing Details (indicate cue type and reason): Assist for bathing while maintaining sitting balance. Pt able to wash LUE, requiring assistance for right side  Lower Body Bathing: Maximal assistance;Sitting/lateral leans Lower Body Bathing Details (indicate cue type and reason): Max assist due to balance deficits and weakness Upper Body Dressing : Moderate assistance;Sitting   Lower Body Dressing: Maximal assistance;Sitting/lateral leans Lower Body Dressing Details (indicate cue type and reason): Pt unable to use LUE as assist due to strength and coordination deficits Toilet Transfer: Minimal assistance;Ambulation;Regular Toilet;Grab bars   Toileting- Clothing Manipulation and Hygiene: Min guard;Sitting/lateral lean Toileting - Clothing Manipulation Details (indicate cue type and reason): Pt able to manage hygiene task and undergarments with RUE     Functional mobility during ADLs: Min guard;Minimal assistance;Rolling walker       Vision  Baseline Vision/History: Wears glasses Wears Glasses: At all times Patient Visual Report: Diplopia;Blurring of vision;Peripheral vision impairment Vision Assessment?: Yes Eye Alignment: Within  Functional Limits Ocular Range of Motion: Within Functional Limits Alignment/Gaze Preference: Within Defined Limits Tracking/Visual Pursuits: Able to track stimulus in all quads without difficulty Saccades: Within functional limits Convergence: Within functional limits Visual Fields: Left homonymous hemianopsia;Left visual field deficit Diplopia Assessment: Only with left gaze;Disappears with one eye closed Additional Comments: Pt reports the diplopia is intermittent and is in the left eye only.             Pertinent Vitals/Pain Pain Assessment: No/denies pain Pain Score: 5  Pain Location: L side  Pain Descriptors / Indicators: Aching Pain Intervention(s): Limited activity within patient's tolerance;Repositioned;Monitored during session     Hand Dominance Left   Extremity/Trunk Assessment Upper Extremity Assessment Upper Extremity Assessment: LUE deficits/detail LUE Deficits / Details: shoulder strength 2/5, elbow 2/5-able to achieve full ROM however unable to hold elbow in flexion for any length of time, wrist extension is 2+/5, wrist flexion is 1/5. Pt reports pain in LUE with P/ROM above 50%. Grip strength is poor LUE Sensation: decreased light touch LUE Coordination: decreased fine motor;decreased gross motor   Lower Extremity Assessment Lower Extremity Assessment: Defer to PT evaluation   Cervical / Trunk Assessment Cervical / Trunk Assessment: Normal   Communication Communication Communication: No difficulties   Cognition Arousal/Alertness: Lethargic Behavior During Therapy: WFL for tasks assessed/performed Overall Cognitive Status: Within Functional Limits for tasks assessed                                                Home Living Family/patient expects to be discharged to:: Private residence Living Arrangements: Spouse/significant other Available Help at Discharge: Family Type of Home: House Home Access: Level entry     Home Layout: One  level               Home Equipment: None   Additional Comments: independent with mobility       Prior Functioning/Environment Level of Independence: Independent        Comments: Pt independent in B/IADLs including driving and community mobility        OT Problem List: Decreased strength;Decreased activity tolerance;Impaired balance (sitting and/or standing);Decreased coordination;Decreased safety awareness;Decreased knowledge of use of DME or AE;Impaired sensation;Impaired UE functional use      OT Treatment/Interventions: Self-care/ADL training;Therapeutic exercise;Neuromuscular education;DME and/or AE instruction;Manual therapy;Therapeutic activities;Patient/family education    OT Goals(Current goals can be found in the care plan section) Acute Rehab OT Goals Patient Stated Goal: To regain strength and be independent OT Goal Formulation: With patient Time For Goal Achievement: 12/07/16 Potential to Achieve Goals: Good  OT Frequency: Min 2X/week    AM-PAC PT "6 Clicks" Daily Activity     Outcome Measure Help from another person eating meals?: A Little Help from another person taking care of personal grooming?: A Little Help from another person toileting, which includes using toliet, bedpan, or urinal?: A Little Help from another person bathing (including washing, rinsing, drying)?: A Lot Help from another person to put on and taking off regular upper body clothing?: A Lot Help from another person to put on and taking off regular lower body clothing?: A Lot 6 Click Score: 15   End of Session Equipment Utilized During Treatment: Gait belt;Rolling walker  Activity  Tolerance: Patient tolerated treatment well Patient left: in bed;with call bell/phone within reach;with nursing/sitter in room;with family/visitor present  OT Visit Diagnosis: Muscle weakness (generalized) (M62.81);Hemiplegia and hemiparesis Hemiplegia - Right/Left: Left Hemiplegia - dominant/non-dominant:  Dominant Hemiplegia - caused by: Unspecified                Time: 1350-1418 OT Time Calculation (min): 28 min Charges:  OT General Charges $OT Visit: 1 Visit OT Evaluation $OT Eval Low Complexity: 1 Low G-Codes: OT G-codes **NOT FOR INPATIENT CLASS** Functional Assessment Tool Used: AM-PAC 6 Clicks Daily Activity Functional Limitation: Self care Self Care Current Status (L7989): At least 40 percent but less than 60 percent impaired, limited or restricted Self Care Goal Status (Q1194): At least 40 percent but less than 60 percent impaired, limited or restricted   [name], OTR/L  [phone] 11/23/2016, 2:30 PM

## 2016-11-24 ENCOUNTER — Observation Stay (HOSPITAL_COMMUNITY): Payer: Medicare HMO

## 2016-11-24 DIAGNOSIS — I10 Essential (primary) hypertension: Secondary | ICD-10-CM | POA: Diagnosis not present

## 2016-11-24 DIAGNOSIS — R531 Weakness: Secondary | ICD-10-CM | POA: Diagnosis not present

## 2016-11-24 DIAGNOSIS — F3178 Bipolar disorder, in full remission, most recent episode mixed: Secondary | ICD-10-CM | POA: Diagnosis not present

## 2016-11-24 DIAGNOSIS — M50223 Other cervical disc displacement at C6-C7 level: Secondary | ICD-10-CM | POA: Diagnosis not present

## 2016-11-24 DIAGNOSIS — R69 Illness, unspecified: Secondary | ICD-10-CM | POA: Diagnosis not present

## 2016-11-24 LAB — CBC
HCT: 26.1 % — ABNORMAL LOW (ref 36.0–46.0)
Hemoglobin: 9.1 g/dL — ABNORMAL LOW (ref 12.0–15.0)
MCH: 27.4 pg (ref 26.0–34.0)
MCHC: 34.9 g/dL (ref 30.0–36.0)
MCV: 78.6 fL (ref 78.0–100.0)
PLATELETS: 343 10*3/uL (ref 150–400)
RBC: 3.32 MIL/uL — AB (ref 3.87–5.11)
RDW: 15.3 % (ref 11.5–15.5)
WBC: 6.9 10*3/uL (ref 4.0–10.5)

## 2016-11-24 LAB — COMPREHENSIVE METABOLIC PANEL
ALBUMIN: 2.8 g/dL — AB (ref 3.5–5.0)
ALK PHOS: 76 U/L (ref 38–126)
ALT: 13 U/L — AB (ref 14–54)
AST: 17 U/L (ref 15–41)
Anion gap: 7 (ref 5–15)
BUN: 10 mg/dL (ref 6–20)
CHLORIDE: 105 mmol/L (ref 101–111)
CO2: 26 mmol/L (ref 22–32)
CREATININE: 0.4 mg/dL — AB (ref 0.44–1.00)
Calcium: 8.8 mg/dL — ABNORMAL LOW (ref 8.9–10.3)
GFR calc Af Amer: >60 mL/min (ref >60)
GFR calc non Af Amer: >60 mL/min (ref >60)
GLUCOSE: 79 mg/dL (ref 65–99)
Potassium: 2.9 mmol/L — ABNORMAL LOW (ref 3.5–5.1)
SODIUM: 138 mmol/L (ref 135–145)
Total Bilirubin: 0.3 mg/dL (ref 0.3–1.2)
Total Protein: 5.7 g/dL — ABNORMAL LOW (ref 6.5–8.1)

## 2016-11-24 LAB — MAGNESIUM: Magnesium: 1.4 mg/dL — ABNORMAL LOW (ref 1.7–2.4)

## 2016-11-24 LAB — TSH: TSH: 2.54 u[IU]/mL (ref 0.350–4.500)

## 2016-11-24 LAB — PHOSPHORUS: Phosphorus: 4.1 mg/dL (ref 2.5–4.6)

## 2016-11-24 MED ORDER — ZOLPIDEM TARTRATE 5 MG PO TABS
5.0000 mg | ORAL_TABLET | Freq: Every evening | ORAL | Status: DC | PRN
  Administered 2016-11-24 – 2016-11-25 (×2): 5 mg via ORAL
  Filled 2016-11-24 (×2): qty 1

## 2016-11-24 MED ORDER — LORAZEPAM 0.5 MG PO TABS
0.5000 mg | ORAL_TABLET | Freq: Four times a day (QID) | ORAL | Status: DC | PRN
  Administered 2016-11-24: 1 mg via ORAL
  Administered 2016-11-24 – 2016-11-25 (×3): 0.5 mg via ORAL
  Administered 2016-11-26 (×3): 1 mg via ORAL
  Filled 2016-11-24 (×3): qty 2
  Filled 2016-11-24 (×2): qty 1
  Filled 2016-11-24: qty 2
  Filled 2016-11-24: qty 1

## 2016-11-24 MED ORDER — NICOTINE 21 MG/24HR TD PT24
21.0000 mg | MEDICATED_PATCH | Freq: Every day | TRANSDERMAL | Status: DC
  Administered 2016-11-24 – 2016-11-26 (×3): 21 mg via TRANSDERMAL
  Filled 2016-11-24 (×3): qty 1

## 2016-11-24 MED ORDER — HYDROCODONE-ACETAMINOPHEN 5-325 MG PO TABS
1.0000 | ORAL_TABLET | Freq: Four times a day (QID) | ORAL | Status: DC | PRN
  Administered 2016-11-24 (×2): 2 via ORAL
  Administered 2016-11-24: 1 via ORAL
  Administered 2016-11-24 – 2016-11-26 (×7): 2 via ORAL
  Filled 2016-11-24 (×2): qty 2
  Filled 2016-11-24: qty 1
  Filled 2016-11-24 (×5): qty 2
  Filled 2016-11-24: qty 1
  Filled 2016-11-24 (×2): qty 2

## 2016-11-24 MED ORDER — POTASSIUM CHLORIDE 10 MEQ/100ML IV SOLN
10.0000 meq | INTRAVENOUS | Status: AC
  Administered 2016-11-24 (×3): 10 meq via INTRAVENOUS
  Filled 2016-11-24 (×3): qty 100

## 2016-11-24 MED ORDER — PROMETHAZINE HCL 25 MG/ML IJ SOLN
12.5000 mg | Freq: Four times a day (QID) | INTRAMUSCULAR | Status: DC | PRN
  Administered 2016-11-24: 12.5 mg via INTRAVENOUS
  Filled 2016-11-24: qty 1

## 2016-11-24 MED ORDER — DIPHENHYDRAMINE HCL 25 MG PO CAPS
25.0000 mg | ORAL_CAPSULE | Freq: Four times a day (QID) | ORAL | Status: DC | PRN
  Administered 2016-11-24 – 2016-11-26 (×2): 25 mg via ORAL
  Filled 2016-11-24 (×2): qty 1

## 2016-11-24 MED ORDER — TRAMADOL HCL 50 MG PO TABS
50.0000 mg | ORAL_TABLET | Freq: Four times a day (QID) | ORAL | Status: DC | PRN
  Administered 2016-11-24 – 2016-11-25 (×2): 50 mg via ORAL
  Filled 2016-11-24 (×2): qty 1

## 2016-11-24 MED ORDER — LORAZEPAM 2 MG/ML IJ SOLN
1.0000 mg | Freq: Once | INTRAMUSCULAR | Status: AC
  Administered 2016-11-24: 1 mg via INTRAVENOUS
  Filled 2016-11-24: qty 1

## 2016-11-24 MED ORDER — ONDANSETRON HCL 4 MG/2ML IJ SOLN
4.0000 mg | Freq: Four times a day (QID) | INTRAMUSCULAR | Status: DC | PRN
  Administered 2016-11-24 – 2016-11-25 (×2): 4 mg via INTRAVENOUS
  Filled 2016-11-24 (×2): qty 2

## 2016-11-24 MED ORDER — MAGNESIUM SULFATE 2 GM/50ML IV SOLN
2.0000 g | Freq: Once | INTRAVENOUS | Status: AC
Start: 2016-11-24 — End: 2016-11-24
  Administered 2016-11-24: 2 g via INTRAVENOUS
  Filled 2016-11-24: qty 50

## 2016-11-24 MED ORDER — POTASSIUM CHLORIDE CRYS ER 20 MEQ PO TBCR
40.0000 meq | EXTENDED_RELEASE_TABLET | Freq: Two times a day (BID) | ORAL | Status: AC
  Administered 2016-11-24 – 2016-11-25 (×3): 40 meq via ORAL
  Filled 2016-11-24 (×3): qty 2

## 2016-11-24 NOTE — Progress Notes (Signed)
Physical Therapy Treatment Patient Details Name: Angel Price MRN: [id number] DOB: 10-08-1965 Today's Date: 11/24/2016    History of Present Illness Angel Price is a 51 y.o. female with medical history significant of cocaine abuse, chronic abdominal pain, etoh abuse comes in for abdominal pain which is usual for her but was also incidentally found to have left sided weakness.  Pt reports left side weakness for over 24 hours.  It feels heavy and numb.  She has also having problems swallowing and she reports drooling although no drooling now.  She reports trouble speaking but none obvious now.  No fevers.  Pt referred for admission for concern of stroke.  No hx of CVA. MRI and CT negative for acute infarct.     PT Comments    Noted low K+ and downtrending Hemoglobin/Hematocrit this session; spoke to RN, who states that patient should be OK to work with PT today. Patient received supine in bed, much more alert than yesterday and willing to participate in PT. Able to perform bed mobility with min guard and sit to stand with min guard and walker; initiated basic balance training tasks in standing then sitting, noting ongoing difficulty with weight bearing L LE and trunk stability in both positions. Patient quite dizzy today, and reported feeling faint and symptomatic during seated balance exercise; pulse ox readings WNL. Due to symptomatic presentation in combination with abnormal lab values, cut today's session short and returned patient to bed. Plan to revisit patient with more aggressive interventions tomorrow if able/tolerated; continue to recommend CIR for now, however may consider SNF if patient remains symptomatic with poor functional activity tolerance as PT continues.     Follow Up Recommendations  CIR;Other (comment) (however if patient remains symptomatic and with poor activity tolerance consider SNF )     Equipment Recommendations  None recommended by PT    Recommendations for  Other Services Rehab consult     Precautions / Restrictions Precautions Precautions: Fall Restrictions Weight Bearing Restrictions: No    Mobility  Bed Mobility Overal bed mobility: Needs Assistance Bed Mobility: Supine to Sit;Sit to Supine     Supine to sit: Min guard Sit to supine: Min guard   General bed mobility comments: Pt used RLE to bring LLE into bed.   Transfers Overall transfer level: Needs assistance Equipment used: Rolling walker (2 wheeled) Transfers: Sit to/from Stand Sit to Stand: Min guard            Ambulation/Gait             General Gait Details: DNT today due to patient being symptomatic    Stairs            Wheelchair Mobility    Modified Rankin (Stroke Patients Only)       Balance Overall balance assessment: Needs assistance;History of Falls Sitting-balance support: No upper extremity supported Sitting balance-Leahy Scale: Fair     Standing balance support: Bilateral upper extremity supported Standing balance-Leahy Scale: Fair                 High Level Balance Comments: poor core strength/trunk stability             Cognition Arousal/Alertness: Awake/alert Behavior During Therapy: WFL for tasks assessed/performed Overall Cognitive Status: Within Functional Limits for tasks assessed  Exercises Other Exercises Other Exercises: standing with walker with feet shoulder width apart, cues for equal weight bearing; also lateral weight shifts in this position with min guard-Min assist   Seated cross midline reaches with B UEs, min guard/cues for trunk stability; isometric trunk stabilization with cues.    General Comments        Pertinent Vitals/Pain Pain Assessment: 0-10 Pain Score: 8  Pain Location: L side and upper middle abdomen  Pain Descriptors / Indicators: Aching Pain Intervention(s): Limited activity within patient's tolerance;Monitored during  session    Home Living                      Prior Function            PT Goals (current goals can now be found in the care plan section) Acute Rehab PT Goals Patient Stated Goal: To regain strength and be independent PT Goal Formulation: With patient Time For Goal Achievement: 12/07/16 Potential to Achieve Goals: Good Progress towards PT goals: Progressing toward goals    Frequency    7X/week      PT Plan      Co-evaluation              AM-PAC PT "6 Clicks" Daily Activity  Outcome Measure  Difficulty turning over in bed (including adjusting bedclothes, sheets and blankets)?: None Difficulty moving from lying on back to sitting on the side of the bed? : None Difficulty sitting down on and standing up from a chair with arms (e.g., wheelchair, bedside commode, etc,.)?: A Little Help needed moving to and from a bed to chair (including a wheelchair)?: A Little Help needed walking in hospital room?: A Little Help needed climbing 3-5 steps with a railing? : A Lot 6 Click Score: 19    End of Session Equipment Utilized During Treatment: Gait belt Activity Tolerance: Patient tolerated treatment well Patient left: in bed;with call bell/phone within reach   PT Visit Diagnosis: Unsteadiness on feet (R26.81);Other abnormalities of gait and mobility (R26.89);Muscle weakness (generalized) (M62.81);History of falling (Z91.81);Difficulty in walking, not elsewhere classified (R26.2);Hemiplegia and hemiparesis Hemiplegia - Right/Left: Left Hemiplegia - caused by: Unspecified     Time: 8182-9937 PT Time Calculation (min) (ACUTE ONLY): 12 min  Charges:  $Therapeutic Activity: 8-22 mins                    G Codes:  Functional Assessment Tool Used: AM-PAC 6 Clicks Basic Mobility;Clinical judgement    Deniece Ree PT, DPT [phone]

## 2016-11-24 NOTE — Progress Notes (Signed)
AP Team 2  SHAUNIECE KWAN  WUJ:[id number] DOB: Sep 11, 1965 DOA: 11/22/2016 PCP: Rosita Fire, MD    Brief Narrative:  51 y.o. female with a hx of bipolar d/o, cocaine abuse, chronic abdominal pain, and EtOH abuse who presented to the ED c/o her chronic abdominal pain, but was also incidentally found to have left sided weakness, which she stated had been present over 24 hours.  She also reported recent difficulty w/ speaking and swallowing, though these sx were not present in the ED.    Subjective: The pt appears to be resting comfortably, but she tells me she is suffering with severe ongoing abdom pain.  She denies cp, sob, or HA.  She c/o diffuse itching as well as nausea.    She asks me specifically for IV dilaudid, telling me "that fentanyl doesn't work for me, and none of those pills will help."  I have explained to her that I would not prescribe IV dilaudid for her chronic idiopathic abdom pain.     Assessment & Plan:  L sided weakness MRI brain 11/23/16 unrevealing, w/ no evidence of CVA or other acute abnormality - B carotid dopplers normal - pt reports sx persist - MRI cervical spine w/o abnormality to explain her sx - will need CIR v/s SNF per PT/OT - etiology unclear - will ask for Neuro to see in AM   Chronic abdom pain LFTs normal - lipase normal - pt now requesting IV dilaudid by name - utilize APAP and ultram w/ small dose infrequent oral narcotic as a last resort - DO NOT escalate to IV narcotics    Persistent severe hypokalemia Cont to replete and follow - suspect this is related to malnutrition, likely driven by substance abuse   Hypomagnesemia Replace and follow - suspect this is related to malnutrition, likely driven by substance abuse   EtOH abuse No evidence of withdrawal at this time   Cocaine abuse  UDS pending   Bipolar D/O  Hx of gastric bypass  HTN BP currently controlled  Normocytic anemia  No evidence of blood loss - suspect this is related to  malnutrition - check Fe studies   DVT prophylaxis: SCDs Code Status: FULL CODE Family Communication: no family present at time of exam  Disposition Plan: begin to mobilize   Consultants:  Neuro (9/10)  Procedures: none  Antimicrobials:  none   Objective: Blood pressure (!) 132/98, pulse 74, temperature 98.1 F (36.7 C), resp. rate 18, height 5\' 3"  (1.6 m), weight 61.6 kg (135 lb 14.4 oz), SpO2 100 %.  Intake/Output Summary (Last 24 hours) at 11/24/16 1543 Last data filed at 11/24/16 1507  Gross per 24 hour  Intake              170 ml  Output                0 ml  Net              170 ml   Filed Weights   11/22/16 1836 11/23/16 0200  Weight: 62.1 kg (137 lb) 61.6 kg (135 lb 14.4 oz)    Examination: General: No acute respiratory distress - alert and oriented  Lungs: Clear to auscultation bilaterally without wheezes or crackles Cardiovascular: Regular rate and rhythm without murmur gallop or rub normal S1 and S2 Abdomen: Nontender, nondistended, soft, bowel sounds positive, no rebound, no ascites, no appreciable mass Extremities: No significant cyanosis, clubbing, or edema bilateral lower extremities Neuro:  CN2-12 intact B -  5/5 strength R U&LE - 4/5 strength in L U&LE, intact sensation touch th/o - no Babinski B  CBC:  Recent Labs Lab 11/22/16 1837 11/24/16 0631  WBC 9.5 6.9  HGB 11.0* 9.1*  HCT 30.8* 26.1*  MCV 79.0 78.6  PLT 385 578   Basic Metabolic Panel:  Recent Labs Lab 11/22/16 2258 11/24/16 0631  NA 142 138  K 3.5 2.9*  CL 109 105  CO2 27 26  GLUCOSE 86 79  BUN 14 10  CREATININE 0.62 0.40*  CALCIUM 9.4 8.8*  MG  --  1.4*  PHOS  --  4.1   GFR: Estimated Creatinine Clearance: 70.4 mL/min (A) (by C-G formula based on SCr of 0.4 mg/dL (L)).  Liver Function Tests:  Recent Labs Lab 11/22/16 2258 11/24/16 0631  AST 19 17  ALT 14 13*  ALKPHOS 76 76  BILITOT 0.1* 0.3  PROT 6.8 5.7*  ALBUMIN 3.3* 2.8*    Recent Labs Lab 11/22/16 2258   LIPASE 24    HbA1C: Hgb A1c MFr Bld  Date/Time Value Ref Range Status  11/23/2016 07:59 AM 5.1 4.8 - 5.6 % Final    Comment:    (NOTE) Pre diabetes:          5.7%-6.4% Diabetes:              >6.4% Glycemic control for   <7.0% adults with diabetes   03/16/2013 11:56 AM 5.0 <5.7 % Final    Comment:                                                                           According to the ADA Clinical Practice Recommendations for 2011, when HbA1c is used as a screening test:     >=6.5%   Diagnostic of Diabetes Mellitus            (if abnormal result is confirmed)   5.7-6.4%   Increased risk of developing Diabetes Mellitus   References:Diagnosis and Classification of Diabetes Mellitus,Diabetes IONG,2952,84(XLKGM 1):S62-S69 and Standards of Medical Care in         Diabetes - 2011,Diabetes Care,2011,34 (Suppl 1):S11-S61.      Scheduled Meds: . aspirin EC  81 mg Oral Daily  . potassium chloride  40 mEq Oral BID  . sucralfate  1 g Oral TID WC & HS     LOS: 0 days    Cherene Altes, MD [facility] Office  [phone] Pager - Text Page per Amion as per below:  On-Call/Text Page:      [email or web address]      password [email or web address]  If 7PM-7AM, please contact night-coverage [email or web address] Password [facility] 11/24/2016, 3:43 PM

## 2016-11-25 ENCOUNTER — Observation Stay (HOSPITAL_COMMUNITY): Payer: Medicare HMO

## 2016-11-25 DIAGNOSIS — R1013 Epigastric pain: Secondary | ICD-10-CM

## 2016-11-25 DIAGNOSIS — F1010 Alcohol abuse, uncomplicated: Secondary | ICD-10-CM

## 2016-11-25 DIAGNOSIS — F310 Bipolar disorder, current episode hypomanic: Secondary | ICD-10-CM | POA: Diagnosis not present

## 2016-11-25 DIAGNOSIS — I63219 Cerebral infarction due to unspecified occlusion or stenosis of unspecified vertebral arteries: Secondary | ICD-10-CM

## 2016-11-25 DIAGNOSIS — R109 Unspecified abdominal pain: Secondary | ICD-10-CM | POA: Diagnosis not present

## 2016-11-25 DIAGNOSIS — I6300 Cerebral infarction due to thrombosis of unspecified precerebral artery: Secondary | ICD-10-CM | POA: Diagnosis not present

## 2016-11-25 DIAGNOSIS — R69 Illness, unspecified: Secondary | ICD-10-CM | POA: Diagnosis not present

## 2016-11-25 LAB — CBC
HCT: 27 % — ABNORMAL LOW (ref 36.0–46.0)
Hemoglobin: 9.4 g/dL — ABNORMAL LOW (ref 12.0–15.0)
MCH: 27.5 pg (ref 26.0–34.0)
MCHC: 34.8 g/dL (ref 30.0–36.0)
MCV: 78.9 fL (ref 78.0–100.0)
PLATELETS: 355 10*3/uL (ref 150–400)
RBC: 3.42 MIL/uL — AB (ref 3.87–5.11)
RDW: 15.2 % (ref 11.5–15.5)
WBC: 6.4 10*3/uL (ref 4.0–10.5)

## 2016-11-25 LAB — IRON AND TIBC
Iron: 51 ug/dL (ref 28–170)
SATURATION RATIOS: 17 % (ref 10.4–31.8)
TIBC: 298 ug/dL (ref 250–450)
UIBC: 247 ug/dL

## 2016-11-25 LAB — RETICULOCYTES
RBC.: 3.42 MIL/uL — ABNORMAL LOW (ref 3.87–5.11)
RETIC CT PCT: 2.2 % (ref 0.4–3.1)
Retic Count, Absolute: 75.2 10*3/uL (ref 19.0–186.0)

## 2016-11-25 LAB — BASIC METABOLIC PANEL
ANION GAP: 6 (ref 5–15)
BUN: 8 mg/dL (ref 6–20)
CALCIUM: 9.3 mg/dL (ref 8.9–10.3)
CO2: 28 mmol/L (ref 22–32)
CREATININE: 0.49 mg/dL (ref 0.44–1.00)
Chloride: 104 mmol/L (ref 101–111)
GFR calc Af Amer: >60 mL/min (ref >60)
GLUCOSE: 92 mg/dL (ref 65–99)
Potassium: 4.1 mmol/L (ref 3.5–5.1)
Sodium: 138 mmol/L (ref 135–145)

## 2016-11-25 LAB — VITAMIN B12: VITAMIN B 12: 146 pg/mL — AB (ref 180–914)

## 2016-11-25 LAB — FOLATE: Folate: 6.3 ng/mL (ref >5.9)

## 2016-11-25 LAB — MAGNESIUM: MAGNESIUM: 1.6 mg/dL — AB (ref 1.7–2.4)

## 2016-11-25 LAB — FERRITIN: FERRITIN: 34 ng/mL (ref 11–307)

## 2016-11-25 MED ORDER — IOPAMIDOL (ISOVUE-300) INJECTION 61%
INTRAVENOUS | Status: AC
  Administered 2016-11-25: 30 mL
  Filled 2016-11-25: qty 30

## 2016-11-25 MED ORDER — IOPAMIDOL (ISOVUE-300) INJECTION 61%
100.0000 mL | Freq: Once | INTRAVENOUS | Status: AC | PRN
  Administered 2016-11-25: 100 mL via INTRAVENOUS

## 2016-11-25 MED ORDER — MAGNESIUM SULFATE 2 GM/50ML IV SOLN
2.0000 g | Freq: Once | INTRAVENOUS | Status: AC
  Administered 2016-11-25: 2 g via INTRAVENOUS
  Filled 2016-11-25: qty 50

## 2016-11-25 MED ORDER — PROMETHAZINE HCL 25 MG/ML IJ SOLN
6.2500 mg | Freq: Four times a day (QID) | INTRAMUSCULAR | Status: DC | PRN
  Administered 2016-11-25 – 2016-11-26 (×2): 12.5 mg via INTRAVENOUS
  Filled 2016-11-25 (×2): qty 1

## 2016-11-25 NOTE — Progress Notes (Addendum)
AP Team 2  DOMINO HOLTEN  PJA:[id number] DOB: 1966-02-03 DOA: 11/22/2016 PCP: Rosita Fire, MD    Brief Narrative:  51 y.o. female with a hx of bipolar d/o, cocaine abuse, chronic abdominal pain, and EtOH abuse who presented to the ED c/o her chronic abdominal pain, but was also incidentally found to have left sided weakness, which she stated had been present over 24 hours.  She also reported recent difficulty w/ speaking and swallowing, though these sx were not present in the ED.    Subjective: Patient continues to endorse abdominal pain but she cannot explicitly explain, diffuse, across her abdomen.  She denies cp, sob, or HA.   Continues to endorse left upper extremity and left lower extremity weakness     Assessment & Plan:  L sided weakness MRI brain 11/23/16 unrevealing, w/ no evidence of CVA or other acute abnormality - B carotid dopplers normal - pt reports sx persist - MRI cervical spine  shows C6-C7 left paracentral disc protrusion, C5-C6 broad-based disc bulge, no foraminal  stenosis, no central canal stenosis, -  CIR v/s SNF per PT/OT - etiology unclear - neurology consulted,  Also called [place] [facility] to discuss results of MRI, Dr. Trenton Gammon does not feel that sx are due to findings on C-SPINE MRI   Chronic abdominal pain LFTs normal - lipase normal - patient requesting IV Dilaudid - recommended CT abdomen pelvis to rule out acute cause  of her abdominal pain , if no reason found, patient will not be receiving IV narcotics  Persistent severe hypokalemia Cont to replete and follow - suspect this is related to malnutrition, likely driven by substance abuse   Hypomagnesemia Replace and follow - suspect this is related to malnutrition, likely driven by substance abuse   EtOH abuse No evidence of withdrawal at this time   Cocaine abuse  UDS   positive for opiates, negative for cocaine  Bipolar D/O  Hx of gastric bypass  HTN BP currently controlled  Normocytic  anemia  No evidence of blood loss - suspect this is related to malnutrition - check Fe studies   DVT prophylaxis: SCDs Code Status: FULL CODE Family Communication: no family present at time of exam  Disposition Plan:    Consultants:  Neurology Neurosurgery via telephone  Procedures: none  Antimicrobials:  none   Objective: Blood pressure (!) 134/99, pulse 70, temperature 97.9 F (36.6 C), temperature source Oral, resp. rate 18, height 5\' 3"  (1.6 m), weight 61.6 kg (135 lb 14.4 oz), SpO2 100 %.  Intake/Output Summary (Last 24 hours) at 11/25/16 0935 Last data filed at 11/24/16 1507  Gross per 24 hour  Intake              170 ml  Output                0 ml  Net              170 ml   Filed Weights   11/22/16 1836 11/23/16 0200  Weight: 62.1 kg (137 lb) 61.6 kg (135 lb 14.4 oz)    Examination: General: No acute respiratory distress - alert and oriented  Lungs: Clear to auscultation bilaterally without wheezes or crackles Cardiovascular: Regular rate and rhythm without murmur gallop or rub normal S1 and S2 Abdomen: Nontender, nondistended, soft, bowel sounds positive, no rebound, no ascites, no appreciable mass Extremities: No significant cyanosis, clubbing, or edema bilateral lower extremities Neuro:  CN2-12 intact B - 5/5 strength R U&LE -  4/5 strength in L U&LE, intact sensation touch th/o - no Babinski B  CBC:  Recent Labs Lab 11/22/16 1837 11/24/16 0631 11/25/16 0641  WBC 9.5 6.9 6.4  HGB 11.0* 9.1* 9.4*  HCT 30.8* 26.1* 27.0*  MCV 79.0 78.6 78.9  PLT 385 343 583   Basic Metabolic Panel:  Recent Labs Lab 11/22/16 2258 11/24/16 0631 11/25/16 0641  NA 142 138 138  K 3.5 2.9* 4.1  CL 109 105 104  CO2 27 26 28   GLUCOSE 86 79 92  BUN 14 10 8   CREATININE 0.62 0.40* 0.49  CALCIUM 9.4 8.8* 9.3  MG  --  1.4* 1.6*  PHOS  --  4.1  --    GFR: Estimated Creatinine Clearance: 70.4 mL/min (by C-G formula based on SCr of 0.49 mg/dL).  Liver Function  Tests:  Recent Labs Lab 11/22/16 2258 11/24/16 0631  AST 19 17  ALT 14 13*  ALKPHOS 76 76  BILITOT 0.1* 0.3  PROT 6.8 5.7*  ALBUMIN 3.3* 2.8*    Recent Labs Lab 11/22/16 2258  LIPASE 24    HbA1C: Hgb A1c MFr Bld  Date/Time Value Ref Range Status  11/23/2016 07:59 AM 5.1 4.8 - 5.6 % Final    Comment:    (NOTE) Pre diabetes:          5.7%-6.4% Diabetes:              >6.4% Glycemic control for   <7.0% adults with diabetes   03/16/2013 11:56 AM 5.0 <5.7 % Final    Comment:                                                                           According to the ADA Clinical Practice Recommendations for 2011, when HbA1c is used as a screening test:     >=6.5%   Diagnostic of Diabetes Mellitus            (if abnormal result is confirmed)   5.7-6.4%   Increased risk of developing Diabetes Mellitus   References:Diagnosis and Classification of Diabetes Mellitus,Diabetes ENMM,7680,88(PJSRP 1):S62-S69 and Standards of Medical Care in         Diabetes - 2011,Diabetes Care,2011,34 (Suppl 1):S11-S61.      Scheduled Meds: . aspirin EC  81 mg Oral Daily  . nicotine  21 mg Transdermal Daily  . sucralfate  1 g Oral TID WC & HS     LOS: 0 days      On-Call/Text Page:      [email or web address]      password [email or web address]  If 7PM-7AM, please contact night-coverage [email or web address] Password [email or web address] 11/25/2016, 9:35 AM

## 2016-11-25 NOTE — Progress Notes (Signed)
Pt c/o nausea and refusing zofran stating it doesn't work. Dr. Myna Hidalgo paged and requested Phenergen per pt waiting for orders.

## 2016-11-25 NOTE — Progress Notes (Signed)
Physical Therapy Treatment Patient Details Name: Angel Price MRN: [id number] DOB: 02/04/1966 Today's Date: 11/25/2016    History of Present Illness Angel Price is a 51 y.o. female with medical history significant of cocaine abuse, chronic abdominal pain, etoh abuse comes in for abdominal pain which is usual for her but was also incidentally found to have left sided weakness.  Pt reports left side weakness for over 24 hours.  It feels heavy and numb.  She has also having problems swallowing and she reports drooling although no drooling now.  She reports trouble speaking but none obvious now.  No fevers.  Pt referred for admission for concern of stroke.  No hx of CVA. MRI and CT negative for acute infarct.     PT Comments    Patient instructed in self exercising LUE by lifting into flexion using RUE x 10 reps and encouraged to range her left shoulder daily to avoid stiffness/pain, demonstrates fair return for left heel to toe stepping, limited secondary to left dorsiflexors.  Patient will benefit from continued physical therapy in hospital and recommended venue below to increase strength, balance, endurance for safe ADLs and gait.    Follow Up Recommendations  CIR     Equipment Recommendations  None recommended by PT    Recommendations for Other Services Rehab consult     Precautions / Restrictions Precautions Precautions: Fall Restrictions Weight Bearing Restrictions: No    Mobility  Bed Mobility Overal bed mobility: Needs Assistance Bed Mobility: Supine to Sit;Sit to Supine     Supine to sit: Min guard Sit to supine: Min guard   General bed mobility comments: Pt used RLE to bring LLE into bed.   Transfers Overall transfer level: Needs assistance Equipment used: Rolling walker (2 wheeled) Transfers: Sit to/from Stand Sit to Stand: Min guard         General transfer comment: demonstrates labored movement  Ambulation/Gait Ambulation/Gait assistance: Min  assist Ambulation Distance (Feet): 30 Feet Assistive device: Rolling walker (2 wheeled) Gait Pattern/deviations: Decreased step length - left;Decreased stance time - left;Decreased stride length   Gait velocity interpretation: Below normal speed for age/gender General Gait Details: required verbal cueing for left heel strike with good carry over demonstrated, but had to hike left hip and required verbal cues not to step to close to [employer] Mobility    Modified Rankin (Stroke Patients Only)       Balance Overall balance assessment: Needs assistance;History of Falls Sitting-balance support: No upper extremity supported;Feet supported Sitting balance-Leahy Scale: Fair     Standing balance support: Bilateral upper extremity supported;During functional activity Standing balance-Leahy Scale: Fair                              Cognition Arousal/Alertness: Awake/alert Behavior During Therapy: WFL for tasks assessed/performed Overall Cognitive Status: Within Functional Limits for tasks assessed                                        Exercises General Exercises - Lower Extremity Ankle Circles/Pumps: AAROM;Left;10 reps;Supine (received stretching to left heelcords) Long Arc Quad: Seated;AROM;Both;10 reps Hip Flexion/Marching: Seated;AROM;Both;10 reps    General Comments        Pertinent Vitals/Pain Pain Assessment: 0-10 Pain Score: 10-Worst pain ever Pain  Location: LUE (however no moaning or facial expression of pain when ranging/exercising LUE Pain Descriptors / Indicators: Aching Pain Intervention(s): Monitored during session    Home Living                      Prior Function            PT Goals (current goals can now be found in the care plan section) Acute Rehab PT Goals Patient Stated Goal: To regain strength and be independent PT Goal Formulation: With patient Time For Goal Achievement:  12/07/16 Potential to Achieve Goals: Good Progress towards PT goals: Progressing toward goals    Frequency    7X/week      PT Plan Current plan remains appropriate    Co-evaluation              AM-PAC PT "6 Clicks" Daily Activity  Outcome Measure  Difficulty turning over in bed (including adjusting bedclothes, sheets and blankets)?: None Difficulty moving from lying on back to sitting on the side of the bed? : None Difficulty sitting down on and standing up from a chair with arms (e.g., wheelchair, bedside commode, etc,.)?: A Little Help needed moving to and from a bed to chair (including a wheelchair)?: A Little Help needed walking in hospital room?: A Little Help needed climbing 3-5 steps with a railing? : A Lot 6 Click Score: 19    End of Session Equipment Utilized During Treatment: Gait belt Activity Tolerance: Patient tolerated treatment well Patient left: in bed;with call bell/phone within reach Nurse Communication: Mobility status;Other (comment) PT Visit Diagnosis: Unsteadiness on feet (R26.81);Other abnormalities of gait and mobility (R26.89);Muscle weakness (generalized) (M62.81);Hemiplegia and hemiparesis Hemiplegia - Right/Left: Left Hemiplegia - dominant/non-dominant: Dominant Hemiplegia - caused by: Unspecified     Time: 2633-3545 PT Time Calculation (min) (ACUTE ONLY): 30 min  Charges:  $Therapeutic Activity: 23-37 mins                    G Codes:  Functional Assessment Tool Used: AM-PAC 6 Clicks Basic Mobility Functional Limitation: Mobility: Walking and moving around Mobility: Walking and Moving Around Current Status (G2563): At least 20 percent but less than 40 percent impaired, limited or restricted Mobility: Walking and Moving Around Goal Status [phone]): At least 20 percent but less than 40 percent impaired, limited or restricted Mobility: Walking and Moving Around Discharge Status [phone]): At least 20 percent but less than 40 percent  impaired, limited or restricted    2:18 PM, 11/25/16 Lonell Grandchild, MPT Physical Therapist with [facility] [phone] office [phone] mobile phone

## 2016-11-25 NOTE — Progress Notes (Signed)
Inpatient Rehabilitation  Per therapy request, patient was screened by Gunnar Fusi for appropriateness for an Inpatient Acute Rehab consult.  At this time we would need a working diagnosis prior to consideration.  Plan to follow up in chart after neuro consult.  Please call if there are questions.   Carmelia Roller., CCC/SLP Admission Coordinator  [facility]  Cell [phone]

## 2016-11-25 NOTE — Care Management Note (Signed)
Case Management Note  Patient Details  Name: Angel Price MRN: [id number] Date of Birth: 09-24-1965  Subjective/Objective:                  Pt admitted with weakness, ?CVA. PT recommends CIR. Pt agreeable to both CIR and SNF. CIR says they will not be able to accept pt unless she has working diagnosis. Neuro eval pending.   Action/Plan: Anticipate DC to SNF. CSW working on SNF. CM will cont to follow and update CIR if diagnosis received.   Expected Discharge Date:     11/26/2016             Expected Discharge Plan:  Home/Self Care  In-House Referral:  Clinical Social Work  Discharge planning Services  CM Consult  Post Acute Care Choice:  NA Choice offered to:  NA  Status of Service:  In process, will continue to follow  Sherald Barge, RN 11/25/2016, 2:07 PM

## 2016-11-25 NOTE — Care Management Obs Status (Signed)
[facility] NOTIFICATION   Patient Details  Name: Angel Price MRN: [id number] Date of Birth: May 31, 1965   Medicare Observation Status Notification Given:  Yes    Sherald Barge, RN 11/25/2016, 2:06 PM

## 2016-11-26 ENCOUNTER — Observation Stay (HOSPITAL_COMMUNITY): Payer: Medicare HMO

## 2016-11-26 DIAGNOSIS — G4700 Insomnia, unspecified: Secondary | ICD-10-CM | POA: Diagnosis not present

## 2016-11-26 DIAGNOSIS — F310 Bipolar disorder, current episode hypomanic: Secondary | ICD-10-CM

## 2016-11-26 DIAGNOSIS — I6300 Cerebral infarction due to thrombosis of unspecified precerebral artery: Secondary | ICD-10-CM | POA: Diagnosis not present

## 2016-11-26 DIAGNOSIS — R69 Illness, unspecified: Secondary | ICD-10-CM | POA: Diagnosis not present

## 2016-11-26 DIAGNOSIS — R531 Weakness: Secondary | ICD-10-CM | POA: Diagnosis not present

## 2016-11-26 DIAGNOSIS — R1013 Epigastric pain: Secondary | ICD-10-CM | POA: Diagnosis not present

## 2016-11-26 DIAGNOSIS — I69952 Hemiplegia and hemiparesis following unspecified cerebrovascular disease affecting left dominant side: Secondary | ICD-10-CM | POA: Diagnosis not present

## 2016-11-26 DIAGNOSIS — E039 Hypothyroidism, unspecified: Secondary | ICD-10-CM | POA: Diagnosis not present

## 2016-11-26 DIAGNOSIS — I10 Essential (primary) hypertension: Secondary | ICD-10-CM | POA: Diagnosis not present

## 2016-11-26 DIAGNOSIS — R269 Unspecified abnormalities of gait and mobility: Secondary | ICD-10-CM | POA: Diagnosis not present

## 2016-11-26 DIAGNOSIS — M545 Low back pain: Secondary | ICD-10-CM | POA: Diagnosis not present

## 2016-11-26 DIAGNOSIS — I63219 Cerebral infarction due to unspecified occlusion or stenosis of unspecified vertebral arteries: Secondary | ICD-10-CM | POA: Diagnosis not present

## 2016-11-26 LAB — TSH: TSH: 6.154 u[IU]/mL — ABNORMAL HIGH (ref 0.350–4.500)

## 2016-11-26 LAB — C-REACTIVE PROTEIN: CRP: 1 mg/dL — AB (ref <1.0)

## 2016-11-26 LAB — SEDIMENTATION RATE: Sed Rate: 25 mm/hr — ABNORMAL HIGH (ref 0–22)

## 2016-11-26 MED ORDER — MAGNESIUM SULFATE 2 GM/50ML IV SOLN
2.0000 g | Freq: Once | INTRAVENOUS | Status: AC
  Administered 2016-11-26: 2 g via INTRAVENOUS
  Filled 2016-11-26: qty 50

## 2016-11-26 MED ORDER — IOPAMIDOL (ISOVUE-370) INJECTION 76%
75.0000 mL | Freq: Once | INTRAVENOUS | Status: AC | PRN
  Administered 2016-11-26: 75 mL via INTRAVENOUS

## 2016-11-26 MED ORDER — ASPIRIN EC 81 MG PO TBEC: 162.0000 mg | DELAYED_RELEASE_TABLET | Freq: Every day | ORAL | Status: DC

## 2016-11-26 MED ORDER — CYANOCOBALAMIN 1000 MCG/ML IJ SOLN: 1000.0000 ug | Freq: Every day | INTRAMUSCULAR | 0 refills | Status: DC

## 2016-11-26 MED ORDER — LISINOPRIL 20 MG PO TABS: 20.0000 mg | ORAL_TABLET | Freq: Every day | ORAL | 11 refills | Status: DC

## 2016-11-26 MED ORDER — CYANOCOBALAMIN 1000 MCG/ML IJ SOLN: 1000.0000 ug | Freq: Every day | INTRAMUSCULAR | Status: DC

## 2016-11-26 MED ORDER — B-12 2500 MCG PO TABS: 5000.0000 ug | ORAL_TABLET | Freq: Every day | ORAL | 2 refills | Status: DC

## 2016-11-26 MED ORDER — NICOTINE 21 MG/24HR TD PT24: 21.0000 mg | MEDICATED_PATCH | Freq: Every day | TRANSDERMAL | 0 refills | Status: DC

## 2016-11-26 MED ORDER — MAGNESIUM OXIDE 400 (241.3 MG) MG PO TABS: 400.0000 mg | ORAL_TABLET | Freq: Every day | ORAL | 2 refills | Status: DC

## 2016-11-26 MED ORDER — ASPIRIN 81 MG PO TBEC: 162.0000 mg | DELAYED_RELEASE_TABLET | Freq: Every day | ORAL | 2 refills | Status: DC

## 2016-11-26 MED ORDER — CYANOCOBALAMIN 1000 MCG/ML IJ SOLN
1000.0000 ug | Freq: Once | INTRAMUSCULAR | Status: AC
  Administered 2016-11-26: 1000 ug via INTRAMUSCULAR
  Filled 2016-11-26: qty 1

## 2016-11-26 NOTE — NC FL2 (Signed)
[place] LEVEL OF CARE SCREENING TOOL     IDENTIFICATION  Patient Name: Angel Price Birthdate: 09-28-1965 Sex: female Admission Date (Current Location): 11/22/2016  [facility] and [place] Number:  [employer] and Address:  [place] [address], [place]      Provider Number: [id number]  Attending Physician Name and Address:  Reyne Dumas, MD  Relative Name and Phone Number:       Current Level of Care: Hospital Recommended Level of Care: [place] Prior Approval Number:    Date Approved/Denied:   PASRR Number:    Discharge Plan: SNF    Current Diagnoses: Patient Active Problem List   Diagnosis Date Noted  . CVA (cerebral vascular accident) ([place]) 11/23/2016  . Bright red rectal bleeding 08/22/2016  . Hypotension due to blood loss   . Acute GI bleeding 05/24/2016  . Dieulafoy lesion of duodenum   . History of Billroth II operation   . Gastrointestinal hemorrhage 05/22/2016  . Absolute anemia   . Pancreatitis, acute   . Acute pancreatitis 10/01/2015  . Abnormal CT scan of lung 10/01/2015  . Alcohol intoxication ([place])   . Left-sided weakness   . Psychosomatic factor in physical condition   . Upper GI bleed   . Diverticulosis of colon with hemorrhage   . Chronic wound infection of abdomen 12/22/2014  . Thyroid disease 12/22/2014  . Hypertension 12/22/2014  . Ataxia 11/01/2014  . Hemorrhoids 04/26/2014  . Lung nodule 04/26/2014  . Diverticulosis   . Gastritis   . Hiatal hernia   . Schatzki's ring   . Acute blood loss anemia 04/25/2014  . Sinus tachycardia 04/25/2014  . Hypokalemia 04/25/2014  . Hyponatremia 04/25/2014  . Hematemesis with nausea 04/25/2014  . Lung nodule < 6cm on CT 04/25/2014  . Intractable nausea and vomiting 04/24/2014  . Gastroparesis   . Abdominal pain 04/01/2014  . Chronic abdominal pain 01/21/2014  . Gastroenteritis 12/10/2013  . Chronic abdominal wound  infection 08/16/2013  . MDD (major depressive disorder), recurrent episode, severe ([place]) 06/27/2013  . Wrist laceration 06/24/2013  . Nausea with vomiting 05/07/2013  . Diarrhea 05/07/2013  . Rectal bleeding 05/07/2013  . Abnormal LFTs 05/07/2013  . Adrenal mass, left ([place]) 05/07/2013  . Abdominal wall abscess at site of surgical wound 04/19/2013  . Abdominal wall abscess 04/18/2013  . Frequent headaches 03/30/2013  . Sleep difficulties 03/30/2013  . Essential hypertension, benign 03/30/2013  . History of cocaine abuse 03/16/2013  . History of schizoaffective disorder 03/16/2013  . Bipolar disorder ([place]) 03/16/2013  . Personality disorder 03/16/2013  . Tobacco abuse 03/16/2013  . Alcohol abuse 03/16/2013  . Palpitations 03/16/2013  . Poor vision 03/16/2013  . History of gastric bypass 03/16/2013  . Status post hysterectomy with oophorectomy 03/16/2013  . Hypothyroid 03/16/2013  . Lupus ([place]) 03/16/2013    Orientation RESPIRATION BLADDER Height & Weight     Time, Situation, Place, Self  Normal Continent Weight: 135 lb 14.4 oz (61.6 kg) Height:  5\' 3"  (160 cm)  BEHAVIORAL SYMPTOMS/MOOD NEUROLOGICAL BOWEL NUTRITION STATUS      Continent Diet (see discharge summary)  AMBULATORY STATUS COMMUNICATION OF NEEDS Skin   Limited Assist Verbally Normal                       Personal Care Assistance Level of Assistance  Bathing, Feeding, Dressing Bathing Assistance: Limited assistance Feeding assistance: Independent Dressing Assistance: Limited assistance  Functional Limitations Info  Sight, Hearing, Speech Sight Info: Adequate Hearing Info: Adequate Speech Info: Adequate    SPECIAL CARE FACTORS FREQUENCY  PT (By licensed PT)     PT Frequency: 5x/week              Contractures Contractures Info: Not present    Additional Factors Info  Code Status, Allergies Code Status Info: Full Code Allergies Info: Codeine, Morphine and related; Reglan            Current Medications (11/26/2016):  This is the current hospital active medication list Current Facility-Administered Medications  Medication Dose Route Frequency Provider Last Rate Last Dose  . acetaminophen (TYLENOL) tablet 650 mg  650 mg Oral Q4H PRN Phillips Grout, MD       Or  . acetaminophen (TYLENOL) suppository 650 mg  650 mg Rectal Q4H PRN Phillips Grout, MD      . aspirin EC tablet 81 mg  81 mg Oral Daily Cherene Altes, MD   81 mg at 11/25/16 4967  . cyanocobalamin ((VITAMIN B-12)) injection 1,000 mcg  1,000 mcg Intramuscular Once Phillips Odor, MD      . diphenhydrAMINE (BENADRYL) capsule 25 mg  25 mg Oral Q6H PRN Cherene Altes, MD   25 mg at 11/26/16 0153  . HYDROcodone-acetaminophen (NORCO/VICODIN) 5-325 MG per tablet 1-2 tablet  1-2 tablet Oral Q6H PRN Opyd, Ilene Qua, MD   2 tablet at 11/26/16 0615  . LORazepam (ATIVAN) tablet 0.5-1 mg  0.5-1 mg Oral Q6H PRN Opyd, Ilene Qua, MD   1 mg at 11/26/16 0615  . nicotine (NICODERM CQ - dosed in mg/24 hours) patch 21 mg  21 mg Transdermal Daily Cherene Altes, MD   21 mg at 11/25/16 5916  . ondansetron (ZOFRAN) injection 4 mg  4 mg Intravenous Q6H PRN Cherene Altes, MD   4 mg at 11/25/16 0414  . promethazine (PHENERGAN) injection 6.25-12.5 mg  6.25-12.5 mg Intravenous Q6H PRN Opyd, Ilene Qua, MD   12.5 mg at 11/25/16 2213  . sucralfate (CARAFATE) tablet 1 g  1 g Oral TID WC & HS Phillips Grout, MD   1 g at 11/25/16 2213  . traMADol (ULTRAM) tablet 50 mg  50 mg Oral Q6H PRN Opyd, Ilene Qua, MD   50 mg at 11/25/16 2219  . zolpidem (AMBIEN) tablet 5 mg  5 mg Oral QHS PRN Cherene Altes, MD   5 mg at 11/25/16 2212     Discharge Medications: Please see discharge summary for a list of discharge medications.  Relevant Imaging Results:  Relevant Lab Results:   Additional Information SSN [id number], Clydene Pugh,

## 2016-11-26 NOTE — Consult Note (Signed)
[place] A. [name], MD     [email or web address]          Angel Price is an 51 y.o. female.   ASSESSMENT/PLAN: 1. I suspect that the patient has a small lacunar infarct that is not seen on MRI.  This is known as MRI negative infarct.   There is likely some psychosomatic symptoms /embellishment of symptoms however.  Risk factors are age,   Hypertension, history of alcohol use, history of nicotine use and also history of cocaine use.  I will obtain a head CTA.  I would increase aspirin to 160 mg a day.  Physical and occupational therapies are recommended. Additional labs will be obtained including RPR, homocystine, sedimentation rate, C-reactive protein, HIV and TSH.  2. Vitamin B12 deficiency: This will be replaced today and I would also suggest daily while she is hospitalized. Afterwards monthly should be fine.  3.  Cervical disc disease multilevel but on likely to explain the patient's symptoms.      The patient is a 51 year old left-handed black female who presents with the acute onset of left upper extremity weakness and numbness associated with the significant dysarthria.  The patient reports her symptoms have not gotten any better and in fact may have gotten worse.  She denies any associated dizziness or swallowing problems.  She has had some chest pain although this may be chronic/subacute.  Patient has a past history of cocaine use.  She tells me she has not used in about 5 years.  She denies any recent medications.  She has been able to ambulate but this has been difficult due to the left-sided weakness.  She has had multiple abdominal procedures as outlined below.  She did present with some abdominal pain.     GENERAL:   This is a pleasant thin female who is in no acute distress.  HEENT:   This is normal.  ABDOMEN: Soft  EXTREMITIES: No edema   BACK: Normal alignment.  SKIN: Normal by inspection.    MENTAL STATUS: Alert and oriented. Speech,-   This is mildly dysarthric.  The language and cognition are generally intact. Judgment and insight normal.   CRANIAL NERVES: Pupils are equal, round and reactive to light and accommodation; extraocular movements are full, there is no significant nystagmus; upper and lower facial muscles are normal in strength and symmetric, there is Mild flattening of the nasolabial fold - L; tongue is midline; uvula is midline; shoulder elevation is normal.  MOTOR:  There is mild weakness of the left upper extremity proximally graded as 4-/ 5.  Hand is a little weaker 3/5.  There is a classic pronator drift of the left upper extremity.  There is moderate impairment of hand dexterity on the left.  There is a profound drift of the left leg.  The strength proximally is  4- and distally 3 /5.  The right side shows normal tone, bulk and strength.  There is no drift.  COORDINATION:   There appears to be mild dysmetria of the left upper extremity.  There may be subtle dysmetria of the right upper extremity.  REFLEXES: Deep tendon reflexes are symmetrical and normal. Plantar responses are flexor bilaterally.   SENSATION:  There is reduced sensation to temp and light touch involving the left upper extremity and left lower extremity.     NIH stroke scale is 6.  Blood pressure 104/64, pulse 81, temperature 97.6 F (36.4 C), temperature source Oral, resp. rate 20, height _0  (1.6  m), weight 135 lb 14.4 oz (61.6 kg), SpO2 98 %.  Past Medical History:  Diagnosis Date  . Abscess    soft tissue  . Adrenal mass ([place])   . Alcohol abuse   . Anxiety   . Blood transfusion without reported diagnosis   . Chronic abdominal pain   . Chronic wound infection of abdomen   . Colon polyp    colonoscopy 04/2014  . Depression   . Diverticulosis    colonoscopy 04/2014 moderat pan colonic  . Gastritis    EGD 05/2014  . Gastroparesis Nov 2015  . GERD (gastroesophageal reflux disease)   . Hemorrhoid    internal large  . Hiatal  hernia   . History of Billroth II operation   . Hypertension   . Lung nodule    CT 02/2014 needs repeat 1 month  . Lung nodule < 6cm on CT 04/25/2014  . Lupus   . Nausea and vomiting    chronic, recurrent  . Schatzki's ring    patent per EGD 04/2014  . Sickle cell trait ([place])   . Suicide attempt ([place])   . 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 ABDOMINAL HYSTERECTOMY  2013   [place]  . ABDOMINAL SURGERY    . ADRENALECTOMY Right   . AGILE CAPSULE N/A 01/05/2015   Procedure: AGILE CAPSULE;  Surgeon: Daneil Dolin, MD;  Location: [facility];  Service: Endoscopy;  Laterality: N/A;  0700  . Billroth II procedure      [place], first 2000, 2005/2006.  Marland Kitchen BIOPSY  05/20/2013   Procedure: BIOPSIES OF ASCENDING AND SIGMOID COLON;  Surgeon: Daneil Dolin, MD;  Location: [facility] ORS;  Service: Endoscopy;;  . BIOPSY  04/26/2014   Procedure: BIOPSIES;  Surgeon: Danie Binder, MD;  Location: [facility] ORS;  Service: Endoscopy;;  . CHOLECYSTECTOMY    . COLONOSCOPY     in [place]  . COLONOSCOPY WITH PROPOFOL N/A 05/20/2013   Dr.Rourk- inadequate prep, normal appearing rectum, grossly normal colon aside from pancolonic diverticula, normal terminal ileum bx= unremarkable colonic mucosa. Due for early interval 2016.   Marland Kitchen COLONOSCOPY WITH PROPOFOL N/A 04/26/2014   [facility]:NTIRWE ileum/one colon polyp removed/moderate pan-colonic diverticulosis/large internal hemorrhoids  . COLONOSCOPY WITH PROPOFOL N/A 12/23/2014   Dr.Rourk- minimal internal hemorrhoids, pancolonic diverticulosis  . COLONOSCOPY WITH PROPOFOL N/A 08/23/2016   Procedure: COLONOSCOPY WITH PROPOFOL;  Surgeon: Danie Binder, MD;  Location[facility];  Service: Endoscopy;  Laterality: N/A;  . DEBRIDEMENT OF ABDOMINAL WALL ABSCESS N/A 02/08/2013   Procedure: DEBRIDEMENT OF ABDOMINAL WALL ABSCESS;  Surgeon: Jamesetta So, MD;  Location: [facility] OR[facility];  Service: General;  Laterality: N/A;  .  ESOPHAGOGASTRODUODENOSCOPY (EGD) WITH PROPOFOL N/A 05/20/2013   Dr.Rourk- s/p prior gastric surgery with normal esophagus, residual gastric mucosa and patent efferent limb  . ESOPHAGOGASTRODUODENOSCOPY (EGD) WITH PROPOFOL N/A 02/03/2014   Dr. Gala Romney:  s/p hemigastrectomy with retained gastric contents. Residual gastric mucosa and efferent limb appeared normal otherwise. Query gastroparesis.   Marland Kitchen ESOPHAGOGASTRODUODENOSCOPY (EGD) WITH PROPOFOL N/A 04/26/2014   RXV:[id number]'P ring/small HH/mild non-erosive gasrtitis/normal anastomosis  . ESOPHAGOGASTRODUODENOSCOPY (EGD) WITH PROPOFOL N/A 12/23/2014   Dr.Rourk- s/p prior hemigastrctomy, active oozing from anastomotic suture site, hemostasis achieved  . ESOPHAGOGASTRODUODENOSCOPY (EGD) WITH PROPOFOL N/A 05/23/2016   Dr. 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 tendon repar  Right    wrist  . WOUND EXPLORATION Right 06/24/2013   Procedure: exploration of traumatic wound right wrist;  Surgeon: Tennis Must, MD;  Location: [place];  Service: Orthopedics;  Laterality: Right;    Family History  Problem Relation Age of Onset  . Brain cancer Son   . Schizophrenia Son   . Cancer Son        brain  . Lung cancer Father   . Cancer Father        mets  . Drug abuse Mother   . Breast cancer Maternal Aunt   . Bipolar disorder Maternal Aunt   . Drug abuse Maternal Aunt   . Colon cancer Maternal Grandmother        late 22s, early 70s  . Drug abuse Sister   . Drug abuse Brother   . Bipolar disorder Paternal Grandfather   . Bipolar disorder Cousin   . Liver disease Neg Hx     Social History:  reports that she has been smoking Cigarettes.  She has a 17.50 pack-year smoking history. She has never used smokeless tobacco. She reports that she drinks alcohol. She reports that she does not use drugs.  Allergies:  Allergies  Allergen Reactions  . Codeine Hives  . Morphine  And Related Hives  . Reglan [Metoclopramide] Other (See Comments)    EPS symptoms     Medications: Prior to Admission medications   Medication Sig Start Date End Date Taking? Authorizing Provider  hydrocortisone (ANUSOL-HC) 2.5 % rectal cream Apply rectally 2 times daily 10/21/16  Yes Isla Pence, MD  lisinopril-hydrochlorothiazide (PRINZIDE,ZESTORETIC) 20-12.5 MG tablet Take 1 tablet by mouth daily.   Yes [provider]  naproxen sodium (ANAPROX) 220 MG tablet Take 220-440 mg by mouth 2 (two) times daily as needed (pain).    Yes [provider]  pantoprazole (PROTONIX) 20 MG tablet Take 1 tablet (20 mg total) by mouth daily. 09/23/16  Yes Francine Graven, DO  polyethylene glycol (MIRALAX / GLYCOLAX) packet Take 17 g by mouth daily as needed for moderate constipation or severe constipation.   Yes [provider]  potassium chloride SA (K-DUR,KLOR-CON) 20 MEQ tablet Take 1 tablet (20 mEq total) by mouth daily. 10/21/16  Yes Isla Pence, MD  sucralfate (CARAFATE) 1 g tablet Take 1 tablet (1 g total) by mouth 4 (four) times daily -  with meals and at bedtime. 09/17/16  Yes Milton Ferguson, MD  vitamin B-12 (CYANOCOBALAMIN) 100 MCG tablet Take 100 mcg by mouth daily.   Yes [provider]    Scheduled Meds: . aspirin EC  81 mg Oral Daily  . nicotine  21 mg Transdermal Daily  . sucralfate  1 g Oral TID WC & HS   Continuous Infusions: PRN Meds:.acetaminophen **OR** [DISCONTINUED] acetaminophen (TYLENOL) oral liquid 160 mg/5 mL **OR** acetaminophen, diphenhydrAMINE, HYDROcodone-acetaminophen, LORazepam, ondansetron (ZOFRAN) IV, promethazine, traMADol, zolpidem     Results for orders placed or performed during the hospital encounter of 11/22/16 (from the past 48 hour(s))  Vitamin B12     Status: Abnormal   Collection Time: 11/25/16  6:41 AM  Result Value Ref Range   Vitamin B-12 146 (L) 180 - 914 pg/mL    Comment: (NOTE) This assay is not validated for  testing neonatal or myeloproliferative syndrome specimens for Vitamin B12 levels. Performed at [facility], [place] [address], [place], [place] [address]   Folate     Status: None   Collection Time: 11/25/16  6:41  AM  Result Value Ref Range   Folate 6.3 >5.9 ng/mL    Comment: Performed at [facility], [place] [address], [place], [place] [address]  Iron and TIBC     Status: None   Collection Time: 11/25/16  6:41 AM  Result Value Ref Range   Iron 51 28 - 170 ug/dL   TIBC 298 250 - 450 ug/dL   Saturation Ratios 17 10.4 - 31.8 %   UIBC 247 ug/dL    Comment: Performed at [facility], [place] [address], [place], [place] [address]  Ferritin     Status: None   Collection Time: 11/25/16  6:41 AM  Result Value Ref Range   Ferritin 34 11 - 307 ng/mL    Comment: Performed at [facility], [place] [address] [place], [place] [address]  Reticulocytes     Status: Abnormal   Collection Time: 11/25/16  6:41 AM  Result Value Ref Range   Retic Ct Pct 2.2 0.4 - 3.1 %   RBC. 3.42 (L) 3.87 - 5.11 MIL/uL   Retic Count, Absolute 75.2 19.0 - 186.0 K/uL  CBC     Status: Abnormal   Collection Time: 11/25/16  6:41 AM  Result Value Ref Range   WBC 6.4 4.0 - 10.5 K/uL   RBC 3.42 (L) 3.87 - 5.11 MIL/uL   Hemoglobin 9.4 (L) 12.0 - 15.0 g/dL   HCT 27.0 (L) 36.0 - 46.0 %   MCV 78.9 78.0 - 100.0 fL   MCH 27.5 26.0 - 34.0 pg   MCHC 34.8 30.0 - 36.0 g/dL   RDW 15.2 11.5 - 15.5 %   Platelets 355 150 - 400 K/uL  Magnesium     Status: Abnormal   Collection Time: 11/25/16  6:41 AM  Result Value Ref Range   Magnesium 1.6 (L) 1.7 - 2.4 mg/dL  Basic metabolic panel     Status: None   Collection Time: 11/25/16  6:41 AM  Result Value Ref Range   Sodium 138 135 - 145 mmol/L   Potassium 4.1 3.5 - 5.1 mmol/L    Comment: DELTA CHECK NOTED   Chloride 104 101 - 111 mmol/L   CO2 28 22 - 32 mmol/L   Glucose, Bld 92 65 - 99 mg/dL   BUN 8 6 - 20 mg/dL   Creatinine, Ser 0.49 0.44 - 1.00 mg/dL   Calcium  9.3 8.9 - 10.3 mg/dL   GFR calc non Af Amer >60 >60 mL/min   GFR calc Af Amer >60 >60 mL/min    Comment: (NOTE) The eGFR has been calculated using the CKD EPI equation. This calculation has not been validated in all clinical situations. eGFR's persistently <60 mL/min signify possible Chronic Kidney Disease.    Anion gap 6 5 - 15    Studies/Results:  BRAIN MRI FINDINGS: Brain: No acute infarction, hemorrhage, hydrocephalus, extra-axial collection or mass lesion.  Image quality degraded by motion.  Vascular: Normal arterial flow voids  Skull and upper cervical spine: Negative  Sinuses/Orbits: Mild mucosal edema paranasal sinuses.  Normal orbit  Other: None  IMPRESSION: Negative MRI head.  Image quality degraded by motion    C SPINE MRI FINDINGS: Alignment: Physiologic.  Vertebrae: No fracture, evidence of discitis, or bone lesion.  Cord: Normal signal and morphology.  Posterior Fossa, vertebral arteries, paraspinal tissues: Posterior fossa demonstrates no focal abnormality. Vertebral artery flow voids are maintained. Paraspinal soft tissues are unremarkable.  Disc levels:  Discs: Degenerative disc disease with  disc height loss at C5-6.  C2-3: No significant disc bulge. No neural foraminal stenosis. No central canal stenosis.  C3-4: No significant disc bulge. No neural foraminal stenosis. No central canal stenosis.  C4-5: No significant disc bulge. No neural foraminal stenosis. No central canal stenosis.  C5-6: Mild broad-based disc bulge. Bilateral uncovertebral degenerative changes. Mild bilateral foraminal stenosis. No central canal stenosis.  C6-7: Shallow left paracentral disc protrusion. No neural foraminal stenosis. No central canal stenosis.  C7-T1: No significant disc bulge. No neural foraminal stenosis. No central canal stenosis.  IMPRESSION: 1. At C6-7 there is a shallow left paracentral disc protrusion. 2. At C5-6 there  is a mild broad-based disc bulge. Bilateral uncovertebral degenerative changes. Mild bilateral foraminal stenosis.       Carotid duplex Doppler normal      The brain MRI scan is reviewed and shows no acute findings on DWI. No significant lesions are seen on the FLAIR scans. Quality is somewhat degraded by motion artifact. No hemorrhages appreciated.  Cervical spine MRI shows posterior disc bulge from C4-C7. The most significant area is at C5-C6 there is moderate canal stenosis but no encroachment on the spinal cord. At that level there appears to be some moderate foraminal stenosis bilaterally.  Abdur Hoglund A. [name], M.D.  Diplomate, Tax adviser of Psychiatry and Neurology ( Neurology). 11/26/2016, 8:06 AM

## 2016-11-26 NOTE — Clinical Social Work Note (Signed)
Patient received no bed offers.  Patient is agreeable to go home with [facility] services.   LCSW signing off.    Darlene Brozowski, Clydene Pugh, LCSW

## 2016-11-26 NOTE — Clinical Social Work Placement (Signed)
   CLINICAL SOCIAL WORK PLACEMENT  NOTE  Date:  11/26/2016  Patient Details  Name: Angel Price MRN: [id number] Date of Birth: 1966-03-07  Clinical Social Work is seeking post-discharge placement for this patient at the [place] level of care (*CSW will initial, date and re-position this form in  chart as items are completed):  Yes   Patient/family provided with [facility] Work Department's list of facilities offering this level of care within the geographic area requested by the patient (or if unable, by the patient's family).  Yes   Patient/family informed of their freedom to choose among providers that offer the needed level of care, that participate in Medicare, Medicaid or managed care program needed by the patient, have an available bed and are willing to accept the patient.  Yes   Patient/family informed of [place]'s ownership interest in [facility] and [facility], as well as of the fact that they are under no obligation to receive care at these facilities.  PASRR submitted to EDS on 11/26/16     PASRR number received on       Existing PASRR number confirmed on       FL2 transmitted to all facilities in geographic area requested by pt/family on 11/26/16     FL2 transmitted to all facilities within larger geographic area on       Patient informed that his/her managed care company has contracts with or will negotiate with certain facilities, including the following:            Patient/family informed of bed offers received.  Patient chooses bed at       Physician recommends and patient chooses bed at      Patient to be transferred to   on  .  Patient to be transferred to facility by       Patient family notified on   of transfer.  Name of family member notified:        PHYSICIAN       Additional Comment:    _______________________________________________ Ihor Gully, LCSW 11/26/2016, 9:38 AM

## 2016-11-26 NOTE — Discharge Summary (Addendum)
Physician Discharge Summary  TASHEMA TILLER MRN: [id number] DOB/AGE: 22-Nov-1965 51 y.o.  PCP: Rosita Fire, MD   Admit date: 11/22/2016 Discharge date: 11/26/2016  Discharge Diagnoses:    Principal Problem:   CVA (cerebral vascular accident) [facility]) Active Problems:   History of cocaine abuse   History of schizoaffective disorder   Bipolar disorder ([place])   Personality disorder   Alcohol abuse   History of gastric bypass   Lupus ([place])   Essential hypertension, benign   Chronic abdominal pain   Left-sided weakness   Psychosomatic factor in physical condition MRI negative infarct   Follow-up recommendations Follow-up with PCP in 3-5 days , including all  additional recommended appointments as below Follow-up CBC, CMP in 3-5 days Patient recommended to follow up with neurology Kofi A. Doonquah, MD      in 1-2 weeks Patient needs close follow-up of her vitamin B-12 levels, may need IM injections to be arranged for by PCP    Current Discharge Medication List    START taking these medications   Details  aspirin EC 81 MG EC tablet Take 2 tablets (162 mg total) by mouth daily. Qty: 60 tablet, Refills: 2    lisinopril (PRINIVIL,ZESTRIL) 20 MG tablet Take 1 tablet (20 mg total) by mouth daily. Qty: 30 tablet, Refills: 11    magnesium oxide (MAG-OX) 400 (241.3 Mg) MG tablet Take 1 tablet (400 mg total) by mouth daily. Qty: 30 tablet, Refills: 2    nicotine (NICODERM CQ - DOSED IN MG/24 HOURS) 21 mg/24hr patch Place 1 patch (21 mg total) onto the skin daily. Qty: 28 patch, Refills: 0      CONTINUE these medications which have NOT CHANGED   Details  hydrocortisone (ANUSOL-HC) 2.5 % rectal cream Apply rectally 2 times daily Qty: 28.35 g, Refills: 0    pantoprazole (PROTONIX) 20 MG tablet Take 1 tablet (20 mg total) by mouth daily. Qty: 15 tablet, Refills: 0    polyethylene glycol (MIRALAX / GLYCOLAX) packet Take 17 g by mouth daily as needed for moderate constipation or  severe constipation.    potassium chloride SA (K-DUR,KLOR-CON) 20 MEQ tablet Take 1 tablet (20 mEq total) by mouth daily. Qty: 14 tablet, Refills: 0    sucralfate (CARAFATE) 1 g tablet Take 1 tablet (1 g total) by mouth 4 (four) times daily -  with meals and at bedtime. Qty: 60 tablet, Refills: 1    vitamin B-12 (CYANOCOBALAMIN) 100 MCG tablet Take 5000 mcg by mouth daily.      STOP taking these medications     lisinopril-hydrochlorothiazide (PRINZIDE,ZESTORETIC) 20-12.5 MG tablet      naproxen sodium (ANAPROX) 220 MG tablet            Discharge Condition: Stable   Discharge Instructions Get Medicines reviewed and adjusted: Please take all your medications with you for your next visit with your Primary MD  Please request your Primary MD to go over all hospital tests and procedure/radiological results at the follow up, please ask your Primary MD to get all Hospital records sent to his/her office.  If you experience worsening of your admission symptoms, develop shortness of breath, life threatening emergency, suicidal or homicidal thoughts you must seek medical attention immediately by calling 911 or calling your MD immediately if symptoms less severe.  You must read complete instructions/literature along with all the possible adverse reactions/side effects for all the Medicines you take and that have been prescribed to you. Take any new Medicines after you have  completely understood and accpet all the possible adverse reactions/side effects.   Do not drive when taking Pain medications.   Do not take more than prescribed Pain, Sleep and Anxiety Medications  Special Instructions: If you have smoked or chewed Tobacco in the last 2 yrs please stop smoking, stop any regular Alcohol and or any Recreational drug use.  Wear Seat belts while driving.  Please note  You were cared for by a hospitalist during your hospital stay. Once you are discharged, your primary care physician  will handle any further medical issues. Please note that NO REFILLS for any discharge medications will be authorized once you are discharged, as it is imperative that you return to your primary care physician (or establish a relationship with a primary care physician if you do not have one) for your aftercare needs so that they can reassess your need for medications and monitor your lab values.     Allergies  Allergen Reactions  . Codeine Hives  . Morphine And Related Hives  . Reglan [Metoclopramide] Other (See Comments)    EPS symptoms       Disposition: Home with home health   Consults:  Neurology    Significant Diagnostic Studies:  Ct Head Wo Contrast  Result Date: 11/22/2016 CLINICAL DATA:  Left-sided weakness EXAM: CT HEAD WITHOUT CONTRAST TECHNIQUE: Contiguous axial images were obtained from the base of the skull through the vertex without intravenous contrast. COMPARISON:  03/29/2015 FINDINGS: Brain: No evidence of acute infarction, hemorrhage, hydrocephalus, extra-axial collection or mass lesion/mass effect. Vascular: No hyperdense vessel.  Carotid artery calcification. Skull: Normal. Negative for fracture or focal lesion. Sinuses/Orbits: No acute finding. Other: None IMPRESSION: No CT evidence for acute intracranial abnormality. Electronically Signed   By: Donavan Foil M.D.   On: 11/22/2016 23:29   Mr Brain Wo Contrast  Result Date: 11/23/2016 CLINICAL DATA:  Facial weakness EXAM: MRI HEAD WITHOUT CONTRAST TECHNIQUE: Multiplanar, multiecho pulse sequences of the brain and surrounding structures were obtained without intravenous contrast. COMPARISON:  CT 11/22/2016 FINDINGS: Brain: No acute infarction, hemorrhage, hydrocephalus, extra-axial collection or mass lesion. Image quality degraded by motion. Vascular: Normal arterial flow voids Skull and upper cervical spine: Negative Sinuses/Orbits: Mild mucosal edema paranasal sinuses.  Normal orbit Other: None IMPRESSION: Negative MRI  head.  Image quality degraded by motion. Electronically Signed   By: Franchot Gallo M.D.   On: 11/23/2016 10:19   Mr Cervical Spine Wo Contrast  Result Date: 11/24/2016 CLINICAL DATA:  Left-sided weakness and numbness EXAM: MRI CERVICAL SPINE WITHOUT CONTRAST TECHNIQUE: Multiplanar, multisequence MR imaging of the cervical spine was performed. No intravenous contrast was administered. COMPARISON:  None. FINDINGS: Alignment: Physiologic. Vertebrae: No fracture, evidence of discitis, or bone lesion. Cord: Normal signal and morphology. Posterior Fossa, vertebral arteries, paraspinal tissues: Posterior fossa demonstrates no focal abnormality. Vertebral artery flow voids are maintained. Paraspinal soft tissues are unremarkable. Disc levels: Discs: Degenerative disc disease with disc height loss at C5-6. C2-3: No significant disc bulge. No neural foraminal stenosis. No central canal stenosis. C3-4: No significant disc bulge. No neural foraminal stenosis. No central canal stenosis. C4-5: No significant disc bulge. No neural foraminal stenosis. No central canal stenosis. C5-6: Mild broad-based disc bulge. Bilateral uncovertebral degenerative changes. Mild bilateral foraminal stenosis. No central canal stenosis. C6-7: Shallow left paracentral disc protrusion. No neural foraminal stenosis. No central canal stenosis. C7-T1: No significant disc bulge. No neural foraminal stenosis. No central canal stenosis. IMPRESSION: 1. At C6-7 there is a shallow left paracentral  disc protrusion. 2. At C5-6 there is a mild broad-based disc bulge. Bilateral uncovertebral degenerative changes. Mild bilateral foraminal stenosis. Electronically Signed   By: Kathreen Devoid   On: 11/24/2016 12:39   Ct Abdomen Pelvis W Contrast  Result Date: 11/25/2016 CLINICAL DATA:  Upper to mid abdominal pain beginning 3 days ago. Recent stroke. EXAM: CT ABDOMEN AND PELVIS WITH CONTRAST TECHNIQUE: Multidetector CT imaging of the abdomen and pelvis was  performed using the standard protocol following bolus administration of intravenous contrast. CONTRAST:  29m ISOVUE-300 IOPAMIDOL (ISOVUE-300) INJECTION 61%, 1010mISOVUE-300 IOPAMIDOL (ISOVUE-300) INJECTION 61% COMPARISON:  09/23/2016 FINDINGS: Lower chest: Calcified granuloma right middle lobe. Atelectasis and both lower lobes left more than right. No pleural or pericardial fluid. Hepatobiliary: No liver parenchymal lesion is seen. Previous cholecystectomy. Less fatty change of the liver than seen previously. Pancreas: Some atrophic changes of the pancreas. No sign of mass or inflammation. Spleen: Normal Adrenals/Urinary Tract: Left adrenal gland appears surgically absent. Right adrenal gland contains a low-density mass measuring 1.9 cm in diameter, unchanged since the previous study and consistent with a benign adenoma. No renal parenchymal lesion is seen. Stomach/Bowel: Previous gastric bypass surgery. The stomach is full of material, presumably due to recent meal. No evidence of ileus, obstruction or free air. No other acute bowel finding. Vascular/Lymphatic: Aortic atherosclerosis. No aneurysm. IVC is normal. No retroperitoneal mass or adenopathy otherwise. Reproductive: Previous hysterectomy.  No pelvic mass. Other: No free fluid or air. Scarring of the anterior abdominal wall presumably related to previous surgeries. Musculoskeletal: Sclerotic change of the superior and inferior rami on the left consistent with insufficiency fractures or healing post traumatic fractures. IMPRESSION: No cause of acute abdominal pain is identified. Previous gastric bypass surgery. The stomach is full of material, presumably due to recent meal. Previous cholecystectomy. Previous right adrenal resection. Scarring of the anterior abdominal wall which appears the same as on the previous exam. Atelectasis in both lower lungs, left more than right. Chronic left adrenal adenoma. Aortic atherosclerosis. Healing fractures of the  superior and inferior rami on the left, post traumatic versus insufficiency. Not present in July of this year. Electronically Signed   By: MaNelson Chimes.D.   On: 11/25/2016 15:18   Us[place]arotid Bilateral  Result Date: 11/23/2016 CLINICAL DATA:  Weakness.  History of hypertension and smoking. EXAM: BILATERAL CAROTID DUPLEX ULTRASOUND TECHNIQUE: GrPearline Cablescale imaging, color Doppler and duplex ultrasound were performed of bilateral carotid and vertebral arteries in the neck. COMPARISON:  None. FINDINGS: Criteria: Quantification of carotid stenosis is based on velocity parameters that correlate the residual internal carotid diameter with NASCET-based stenosis levels, using the diameter of the distal internal carotid lumen as the denominator for stenosis measurement. The following velocity measurements were obtained: RIGHT ICA:  74/24 cm/sec CCA:  9421/30m/sec SYSTOLIC ICA/CCA RATIO:  0.8 DIASTOLIC ICA/CCA RATIO:  1.2 ECA:  86 cm/sec LEFT ICA:  112/48 cm/sec CCA:  7986/57m/sec SYSTOLIC ICA/CCA RATIO:  1.4 DIASTOLIC ICA/CCA RATIO:  2.1 ECA:  73 cm/sec RIGHT CAROTID ARTERY: There is no grayscale evidence of significant intimal thickening or atherosclerotic plaque affecting interrogated portions of the right carotid system. There are no elevated peak systolic velocities within the interrogated course of the right internal carotid artery to suggest a hemodynamically significant stenosis. RIGHT VERTEBRAL ARTERY:  Antegrade flow LEFT CAROTID ARTERY: There is no grayscale evidence of significant intimal thickening or atherosclerotic plaque affecting the interrogated portions of the left carotid system. There are no elevated peak systolic velocities  within the interrogated course of the left internal carotid artery to suggest a hemodynamically significant stenosis. LEFT VERTEBRAL ARTERY:  Antegrade Flow IMPRESSION: Normal carotid Doppler ultrasound. Electronically Signed   By: Sandi Mariscal M.D.   On: 11/23/2016 12:53   Dg Hip  Unilat W Or Wo Pelvis 2-3 Views Left  Result Date: 11/01/2016 CLINICAL DATA:  Left hip pain for 1 week EXAM: DG HIP (WITH OR WITHOUT PELVIS) 2-3V LEFT COMPARISON:  None. FINDINGS: There is no evidence of hip fracture or dislocation. There is no evidence of arthropathy or other focal bone abnormality. IMPRESSION: No acute osseous injury of the left hip. Electronically Signed   By: Kathreen Devoid   On: 11/01/2016 19:34    echocardiogram  LV EF: 60% -   65%  ------------------------------------------------------------------- Indications:      Abnormal EKG 794.31.  ------------------------------------------------------------------- History:   PMH:  Cocaine use. Alcohol abuse. Schizoaffective disorder.  Stroke.  Risk factors:  Current tobacco use. Hypertension.  ------------------------------------------------------------------- Study Conclusions  - Left ventricle: The cavity size was normal. Systolic function was   normal. The estimated ejection fraction was in the range of 60%   to 65%. Mild to moderate concentric LVH. Wall motion was normal;   there were no regional wall motion abnormalities. Doppler   parameters are consistent with abnormal left ventricular   relaxation (grade 1 diastolic dysfunction). Indeterminate filling   pressures. - Mitral valve: There was mild regurgitation. - Systemic veins: Dilated IVC with normal respiratory variation.   Estimated CVP 8 mmHg.      Filed Weights   11/22/16 1836 11/23/16 0200  Weight: 62.1 kg (137 lb) 61.6 kg (135 lb 14.4 oz)     Microbiology: No results found for this or any previous visit (from the past 240 hour(s)).     Blood Culture    Component Value Date/Time   SDES URINE, CLEAN CATCH 08/24/2016 0803   SPECREQUEST NONE 08/24/2016 0803   CULT  08/24/2016 0803    NO GROWTH Performed at [facility], [place] [address] [place], [place] [address]    REPTSTATUS 08/26/2016 FINAL 08/24/2016 6568       Labs: Results for orders placed or performed during the hospital encounter of 11/22/16 (from the past 48 hour(s))  Vitamin B12     Status: Abnormal   Collection Time: 11/25/16  6:41 AM  Result Value Ref Range   Vitamin B-12 146 (L) 180 - 914 pg/mL    Comment: (NOTE) This assay is not validated for testing neonatal or myeloproliferative syndrome specimens for Vitamin B12 levels. Performed at [facility], [place] [address], [place], [place] [address]   Folate     Status: None   Collection Time: 11/25/16  6:41 AM  Result Value Ref Range   Folate 6.3 >5.9 ng/mL    Comment: Performed at [facility] [address] [place], [place] [address]  Iron and TIBC     Status: None   Collection Time: 11/25/16  6:41 AM  Result Value Ref Range   Iron 51 28 - 170 ug/dL   TIBC 298 250 - 450 ug/dL   Saturation Ratios 17 10.4 - 31.8 %   UIBC 247 ug/dL    Comment: Performed at [facility], [place] [address] [place], [place] [address]  Ferritin     Status: None   Collection Time: 11/25/16  6:41 AM  Result Value Ref Range   Ferritin 34 11 - 307 ng/mL  Comment: Performed at [facility], [place] [address], [place], [place] [address]  Reticulocytes     Status: Abnormal   Collection Time: 11/25/16  6:41 AM  Result Value Ref Range   Retic Ct Pct 2.2 0.4 - 3.1 %   RBC. 3.42 (L) 3.87 - 5.11 MIL/uL   Retic Count, Absolute 75.2 19.0 - 186.0 K/uL  CBC     Status: Abnormal   Collection Time: 11/25/16  6:41 AM  Result Value Ref Range   WBC 6.4 4.0 - 10.5 K/uL   RBC 3.42 (L) 3.87 - 5.11 MIL/uL   Hemoglobin 9.4 (L) 12.0 - 15.0 g/dL   HCT 27.0 (L) 36.0 - 46.0 %   MCV 78.9 78.0 - 100.0 fL   MCH 27.5 26.0 - 34.0 pg   MCHC 34.8 30.0 - 36.0 g/dL   RDW 15.2 11.5 - 15.5 %   Platelets 355 150 - 400 K/uL  Magnesium     Status: Abnormal   Collection Time: 11/25/16  6:41 AM  Result Value Ref Range   Magnesium 1.6 (L) 1.7 - 2.4 mg/dL  Basic metabolic panel     Status: None    Collection Time: 11/25/16  6:41 AM  Result Value Ref Range   Sodium 138 135 - 145 mmol/L   Potassium 4.1 3.5 - 5.1 mmol/L    Comment: DELTA CHECK NOTED   Chloride 104 101 - 111 mmol/L   CO2 28 22 - 32 mmol/L   Glucose, Bld 92 65 - 99 mg/dL   BUN 8 6 - 20 mg/dL   Creatinine, Ser 0.49 0.44 - 1.00 mg/dL   Calcium 9.3 8.9 - 10.3 mg/dL   GFR calc non Af Amer >60 >60 mL/min   GFR calc Af Amer >60 >60 mL/min    Comment: (NOTE) The eGFR has been calculated using the CKD EPI equation. This calculation has not been validated in all clinical situations. eGFR's persistently <60 mL/min signify possible Chronic Kidney Disease.    Anion gap 6 5 - 15  TSH     Status: Abnormal   Collection Time: 11/26/16  8:24 AM  Result Value Ref Range   TSH 6.154 (H) 0.350 - 4.500 uIU/mL    Comment: Performed by a 3rd Generation assay with a functional sensitivity of <=0.01 uIU/mL.  Sedimentation rate     Status: Abnormal   Collection Time: 11/26/16  8:24 AM  Result Value Ref Range   Sed Rate 25 (H) 0 - 22 mm/hr     Lipid Panel     Component Value Date/Time   CHOL 165 11/23/2016 0759   TRIG 166 (H) 11/23/2016 0759   HDL 33 (L) 11/23/2016 0759   CHOLHDL 5.0 11/23/2016 0759   VLDL 33 11/23/2016 0759   LDLCALC 99 11/23/2016 0759   LDLDIRECT 129 (H) 03/16/2013 1156     Lab Results  Component Value Date   HGBA1C 5.1 11/23/2016   HGBA1C 5.0 03/16/2013     Lab Results  Component Value Date   LDLCALC 99 11/23/2016   CREATININE 0.49 11/25/2016     HPI :  51 y.o.femalewith a hx of bipolar d/o, cocaine abuse, chronic abdominal pain, and EtOH abuse who presented to the ED c/o her chronic abdominal pain, but was also incidentally found to have left sided weakness, which she stated had been present over 24 hours.  She also reported recent difficulty w/ speaking and swallowing, though these sx were not present in the ED.    HOSPITAL  COURSE:  L sided weakness MRI brain 11/23/16 unrevealing, w/ no  evidence of CVA or other acute abnormality , bilateral  carotid dopplers normal - pt reports sx persist - MRI cervical spine  shows C6-C7 left paracentral disc protrusion, C5-C6 broad-based disc bulge, no foraminal  stenosis, no central canal stenosis, -  CIR v/s SNF per PT/OT - etiology unclear - neurology consulted and thought that patient had a small lacunar infarct which was not seen on MRI. Patient could have MRI negative infarct. Also likely has some psychosomatic symptoms associated with it, neurology recommended CTA  Head and neck which showed No acute stroke Also called [place] [facility] to discuss results of MRI of the C-spine, Dr. Trenton Gammon does not feel that sx are due to findings on C-SPINE MRI   Chronic abdominal pain LFTs normal - lipase normal - patient requesting IV Dilaudid throughout this hospitalization  - recommended CT abdomen pelvis to rule out acute cause  of her abdominal pain , if no reason found, patient will not be receiving IV narcotics. CT abdomen pelvis did not show any acute abdominal issues. Previous history of gastric bypass surgery.  Persistent severe hypokalemia Repleted discontinued HCTZ   Hypomagnesemia Replace and follow - suspect this is related to malnutrition, likely driven by substance abuse  Started on magnesium oxide supplementation  EtOH abuse No evidence of withdrawal at this time , admits to being sober for at least one month  Cocaine abuse  UDS   positive for opiates, negative for cocaine  Bipolar D/O  Hx of gastric bypass  HTN BP currently controlled  Normocytic anemia with severe vitamin B-12 deficiency No evidence of blood loss - suspect this is related to malnutrition - iron studies normal Likely not absorbing her sublingual B-12 Started on vitamin B-12 injections IM in the hospital Recommend  IM injections monthly, to be arranged for by PCP     Discharge Exam:   Blood pressure 99/77, pulse 85, temperature 98.4 F  (36.9 C), temperature source Oral, resp. rate 20, height _0  (1.6 m), weight 61.6 kg (135 lb 14.4 oz), SpO2 97 %.  General: No acute respiratory distress - alert and oriented  Lungs: Clear to auscultation bilaterally without wheezes or crackles Cardiovascular: Regular rate and rhythm without murmur gallop or rub normal S1 and S2 Abdomen: Nontender, nondistended, soft, bowel sounds positive, no rebound, no ascites, no appreciable mass Extremities: No significant cyanosis, clubbing, or edema bilateral lower extremities Neuro:  CN2-12 intact B - 5/5 strength R U&LE - 4/5 strength in L U&LE, intact sensation touch th/o - no Babinski B    Follow-up Information    Rosita Fire, MD. Call.   Specialty:  Internal Medicine Why:  3-5 days, check CBC, cmp, magnesium Contact information: [place] [address] [phone]        Phillips Odor, MD. Call.   Specialty:  Neurology Why:  Hospital follow-up in one to 2 weeks Contact information: [address] [place] [address] [phone]           Signed: Reyne Dumas 11/26/2016, 1:23 PM        Time spent >1 hour

## 2016-11-26 NOTE — Care Management Note (Signed)
Case Management Note  Patient Details  Name: Angel Price MRN: [id number] Date of Birth: 1966/03/09  Expected Discharge Date:  11/26/16               Expected Discharge Plan:  [place]  In-House Referral:  Clinical Social Work  Discharge planning Services  CM Consult  Post Acute Care Choice:  Home Health Choice offered to:  Patient  HH Arranged:  RN, PT, OT, Nurse's Aide [place] Agency:  [facility]  Status of Service:  Completed, signed off  Additional Comments: Pt discharging home today. She does not have "working diagnosis" for CIR, SNF not option because no facilities have offered bed. Pt agreeable to [facility]. She has used [facility] int he past and would like them again. Pt aware HH has 48 hrs to make first visit. Vaughan Basta, [facility] rep, aware of referral and will obtain pt info from chart. Pt has no DME needs at DC.   Sherald Barge, RN 11/26/2016, 3:27 PM

## 2016-11-26 NOTE — Progress Notes (Addendum)
Occupational Therapy Treatment Patient Details Name: Angel Price MRN: [id number] DOB: 10-Jan-1966 Today's Date: 11/26/2016    History of present illness Angel Price is a 51 y.o. female with medical history significant of cocaine abuse, chronic abdominal pain, etoh abuse comes in for abdominal pain which is usual for her but was also incidentally found to have left sided weakness.  Pt reports left side weakness for over 24 hours.  It feels heavy and numb.  She has also having problems swallowing and she reports drooling although no drooling now.  She reports trouble speaking but none obvious now.  No fevers.  Pt referred for admission for concern of stroke.  No hx of CVA. MRI and CT negative for acute infarct.    OT comments  Pt received supine in bed, marginally agreeable to OT treatment. Pt reporting sharp pains in abdomen, also reporting nothing helps with the pain; however during treatment pt not demonstrating visible signs of severe pain. OT notes pt using LUE during functional tasks with minimal difficulty. Using RUE instead of dominant LUE during grooming, however used BUE for wringing out washcloth and preparing items with little to no difficulty. Pt reports feeling as if LLE will buckle, able to complete grooming tasks in standing and go from bed to sink to bathroom and back to bed with no buckling. Recommend HHOT services at this time.    Follow Up Recommendations  HHOT; supervision 24/7   Equipment Recommendations  None recommended by OT       Precautions / Restrictions Precautions Precautions: Fall       Mobility Bed Mobility Overal bed mobility: Needs Assistance Bed Mobility: Supine to Sit;Sit to Supine     Supine to sit: Supervision Sit to supine: Supervision   General bed mobility comments: Pt noted to remove covers with LUE with no difficulty with grasp or strength. Pt used RLE to bring LLE into bed.   Transfers Overall transfer level: Needs  assistance Equipment used: Rolling walker (2 wheeled) Transfers: Sit to/from Stand Sit to Stand: Min guard         General transfer comment: Increased time for transfer        ADL either performed or assessed with clinical judgement   ADL Overall ADL's : Needs assistance/impaired     Grooming: Wash/dry hands;Wash/dry face;Min guard;Standing Grooming Details (indicate cue type and reason): Pt able to perform grooming tasks using BUE, no difficulty using LUE when wringing out washcloth and washing hands                 Toilet Transfer: Min guard;Ambulation;Regular Toilet;Grab bars;RW [employer] forces technical officer Details (indicate cue type and reason): Cuing to use grab bar versus walker for transfer Toileting- Clothing Manipulation and Hygiene: Supervision/safety;Sitting/lateral lean Toileting - Clothing Manipulation Details (indicate cue type and reason): Pt used RUE for hygiene tasks     Functional mobility during ADLs: Min guard;Minimal assistance;Rolling walker General ADL Comments: Pt noted to use LUE as assist with minimal difficulty today. Pt with good grasp on walker and ability to support weight using BUE with RW.      Vision   Additional Comments: No difficulties with vision during functional task completion, pt able to navigate room with no difficulties           Cognition Arousal/Alertness: Lethargic Behavior During Therapy: [facility] for tasks assessed/performed Overall Cognitive Status: Within Functional Limits for tasks assessed  Pertinent Vitals/ Pain       Pain Assessment: Faces Faces Pain Scale: Hurts little more Pain Location: abdomen Pain Descriptors / Indicators: Sharp Pain Intervention(s): Limited activity within patient's tolerance;Monitored during session;Repositioned         Frequency  Min 2X/week        Progress Toward Goals  OT Goals(current goals can now be found in the  care plan section)  Progress towards OT goals: Progressing toward goals  Acute Rehab OT Goals Patient Stated Goal: To regain strength and be independent OT Goal Formulation: With patient Time For Goal Achievement: 12/07/16 Potential to Achieve Goals: Good ADL Goals Pt Will Perform Grooming: with min guard assist;standing Pt Will Transfer to Toilet: with supervision;ambulating;regular height toilet;grab bars Pt/caregiver will Perform Home Exercise Program: Increased strength;Left upper extremity;With Supervision;With written HEP provided  Plan Discharge plan needs to be updated       AM-PAC PT "6 Clicks" Daily Activity     Outcome Measure   Help from another person eating meals?: A Little Help from another person taking care of personal grooming?: A Little Help from another person toileting, which includes using toliet, bedpan, or urinal?: A Little Help from another person bathing (including washing, rinsing, drying)?: A Lot Help from another person to put on and taking off regular upper body clothing?: A Lot Help from another person to put on and taking off regular lower body clothing?: A Lot 6 Click Score: 15    End of Session Equipment Utilized During Treatment: Gait belt;Rolling walker  OT Visit Diagnosis: Muscle weakness (generalized) (M62.81);Hemiplegia and hemiparesis Hemiplegia - Right/Left: Left Hemiplegia - dominant/non-dominant: Dominant Hemiplegia - caused by: Unspecified   Activity Tolerance Patient limited by lethargy   Patient Left in bed;with call bell/phone within reach           Time: 0750-0812 OT Time Calculation (min): 22 min  Charges: OT General Charges $OT Visit: 1 Visit OT Treatments $Self Care/Home Management : 8-22 mins    [name], OTR/L  [phone] 11/26/2016, 8:20 AM

## 2016-11-26 NOTE — Progress Notes (Signed)
Physical Therapy Treatment Patient Details Name: Angel Price MRN: [id number] DOB: 22-Feb-1966 Today's Date: 11/26/2016    History of Present Illness Angel Price is a 51 y.o. female with medical history significant of cocaine abuse, chronic abdominal pain, etoh abuse comes in for abdominal pain which is usual for her but was also incidentally found to have left sided weakness.  Pt reports left side weakness for over 24 hours.  It feels heavy and numb.  She has also having problems swallowing and she reports drooling although no drooling now.  She reports trouble speaking but none obvious now.  No fevers.  Pt referred for admission for concern of stroke.  No hx of CVA. MRI and CT negative for acute infarct.     PT Comments    Patient demonstrates much improvement for using LUE for functional task, limited for gait secondary to c/o fatigue.  Patient will benefit from continued physical therapy in hospital and recommended venue below to increase strength, balance, endurance for safe ADLs and gait.    Follow Up Recommendations  Home health PT;Supervision - Intermittent     Equipment Recommendations  None recommended by PT    Recommendations for Other Services       Precautions / Restrictions Precautions Precautions: Fall Restrictions Weight Bearing Restrictions: No    Mobility  Bed Mobility Overal bed mobility: Needs Assistance Bed Mobility: Supine to Sit;Sit to Supine     Supine to sit: Supervision Sit to supine: Supervision   General bed mobility comments: Patient demonstrates good functional use of LUE reaching, etch  Transfers Overall transfer level: Needs assistance Equipment used: Rolling walker (2 wheeled) Transfers: Sit to/from Stand Sit to Stand: Supervision            Ambulation/Gait Ambulation/Gait assistance: Min guard Ambulation Distance (Feet): 25 Feet Assistive device: Rolling walker (2 wheeled) Gait Pattern/deviations: Decreased step length -  right;Decreased step length - left;Decreased stride length;Decreased dorsiflexion - left   Gait velocity interpretation: Below normal speed for age/gender General Gait Details: Patient demonstrates improvement for left heel to toe stepping, but has to hike hip to clear toes   Stairs            Wheelchair Mobility    Modified Rankin (Stroke Patients Only)       Balance Overall balance assessment: Needs assistance Sitting-balance support: No upper extremity supported;Feet supported Sitting balance-Leahy Scale: Good     Standing balance support: Bilateral upper extremity supported;During functional activity Standing balance-Leahy Scale: Fair                              Cognition Arousal/Alertness: Awake/alert Behavior During Therapy: WFL for tasks assessed/performed Overall Cognitive Status: Within Functional Limits for tasks assessed                                        Exercises General Exercises - Lower Extremity Ankle Circles/Pumps: AROM;Seated;Both;10 reps Long Arc Quad: Seated;AROM;Both;10 reps Hip ABduction/ADduction: Seated;AROM;Both;10 reps    General Comments        Pertinent Vitals/Pain Pain Assessment: 0-10 Pain Score: 9  Pain Location: LUE Pain Descriptors / Indicators: Aching Pain Intervention(s): Limited activity within patient's tolerance;Monitored during session    Home Living                      Prior  Function            PT Goals (current goals can now be found in the care plan section) Acute Rehab PT Goals Patient Stated Goal: To regain strength and be independent PT Goal Formulation: With patient Time For Goal Achievement: 12/07/16 Potential to Achieve Goals: Good Progress towards PT goals: Progressing toward goals    Frequency    7X/week      PT Plan Current plan remains appropriate    Co-evaluation              AM-PAC PT "6 Clicks" Daily Activity  Outcome Measure   Difficulty turning over in bed (including adjusting bedclothes, sheets and blankets)?: None Difficulty moving from lying on back to sitting on the side of the bed? : None Difficulty sitting down on and standing up from a chair with arms (e.g., wheelchair, bedside commode, etc,.)?: None Help needed moving to and from a bed to chair (including a wheelchair)?: A Little Help needed walking in hospital room?: A Little Help needed climbing 3-5 steps with a railing? : A Little 6 Click Score: 21    End of Session Equipment Utilized During Treatment: Gait belt Activity Tolerance: Patient limited by fatigue;Patient tolerated treatment well Patient left:  (Patient went to bathroom using FWW after therapy) Nurse Communication: Mobility status PT Visit Diagnosis: Unsteadiness on feet (R26.81);Other abnormalities of gait and mobility (R26.89);Muscle weakness (generalized) (M62.81)     Time: 6433-2951 PT Time Calculation (min) (ACUTE ONLY): 26 min  Charges:  $Therapeutic Activity: 23-37 mins                    G Codes:  Functional Assessment Tool Used: AM-PAC 6 Clicks Basic Mobility Functional Limitation: Mobility: Walking and moving around Mobility: Walking and Moving Around Current Status ([id number]): At least 20 percent but less than 40 percent impaired, limited or restricted Mobility: Walking and Moving Around Goal Status [phone]): At least 20 percent but less than 40 percent impaired, limited or restricted Mobility: Walking and Moving Around Discharge Status [phone]): At least 20 percent but less than 40 percent impaired, limited or restricted   4:25 PM, 11/26/16 Lonell Grandchild, MPT Physical Therapist with [facility] 336 [phone] office [phone] mobile phone

## 2016-11-26 NOTE — Plan of Care (Signed)
Problem: Pain Managment: Goal: General experience of comfort will improve Outcome: Adequate for Discharge Pt continues to c/o mid abdominal pain, but requesting food from family members over telephone. Pt given Phenergen d/t nausea with no vomiting. Pt states her pain pills are not working and she needs something stronger. Pt adv that MD has no order for IV pain medication and she will need to speak with her doctor in the am. Pt educated on pain mgmt as well as medications available. Will continue to monitor pt

## 2016-11-26 NOTE — Clinical Social Work Note (Signed)
Clinical Social Work Assessment  Patient Details  Name: Angel Price MRN: [id number] Date of Birth: [birth year]  Date of referral:  11/25/16               Reason for consult:  Discharge Planning                Permission sought to share information with:    Permission granted to share information::     Name::        Agency::     Relationship::     Contact Information:     Housing/Transportation Living arrangements for the past 2 months:  Single Family Home Source of Information:  Patient Patient Interpreter Needed:  None Criminal Activity/Legal Involvement Pertinent to Current Situation/Hospitalization:  No - Comment as needed Significant Relationships:  Siblings, Parents Lives with:  Parents, Siblings Do you feel safe going back to the place where you live?  Yes Need for family participation in patient care:  Yes (Comment)  Care giving concerns:  None identified at baseline.    Social Worker assessment / plan:  Patient lives in the home with her parents and sister. At baseline, she ambulates with a walker and is independent in her ADLs.  Patient is agreeable to SNF or CIR. She advised that she desires to go to [facility] or [facility].  Patient has not used cocaine since 09/30/11 and alcohol since 09/19/16.   Employment status:  Disabled (Comment on whether or not currently receiving Disability) Insurance information:  Managed Medicare PT Recommendations:  [place] / Referral to community resources:  [place]  Patient/Family's Response to care: Patient is agreeable to SNF or CIR.   Patient/Family's Understanding of and Emotional Response to Diagnosis, Current Treatment, and Prognosis:  Patient understands her diagnosis, treatment and prognosis.   Emotional Assessment Appearance:  Appears stated age Attitude/Demeanor/Rapport:    Affect (typically observed):  Accepting, Calm Orientation:  Oriented to Self, Oriented to Place,  Oriented to  Time, Oriented to Situation Alcohol / Substance use:   (Patient reports that she has not used Cocaine since September 30, 2011, She reports that she has not used alohol since September 19, 2016. ) Psych involvement (Current and /or in the community):  No (Comment)  Discharge Needs  Concerns to be addressed:  Discharge Planning Concerns Readmission within the last 30 days:  No Current discharge risk:  None Barriers to Discharge:  No Barriers Identified   Ihor Gully, LCSW 11/26/2016, 10:58 AM

## 2016-11-27 LAB — HOMOCYSTEINE: Homocysteine: 19.5 umol/L — ABNORMAL HIGH (ref 0.0–15.0)

## 2016-12-02 ENCOUNTER — Inpatient Hospital Stay (HOSPITAL_COMMUNITY)
Admission: EM | Admit: 2016-12-02 | Discharge: 2016-12-05 | DRG: 057 | Disposition: A | Discharge status: Home Health | Payer: MEDICARE | Attending: Internal Medicine | Admitting: Internal Medicine

## 2016-12-02 ENCOUNTER — Emergency Department (HOSPITAL_COMMUNITY): Payer: MEDICARE

## 2016-12-02 ENCOUNTER — Encounter (HOSPITAL_COMMUNITY): Payer: Self-pay

## 2016-12-02 DIAGNOSIS — K3184 Gastroparesis: Secondary | ICD-10-CM | POA: Diagnosis present

## 2016-12-02 DIAGNOSIS — Z803 Family history of malignant neoplasm of breast: Secondary | ICD-10-CM | POA: Diagnosis not present

## 2016-12-02 DIAGNOSIS — R42 Dizziness and giddiness: Secondary | ICD-10-CM | POA: Diagnosis present

## 2016-12-02 DIAGNOSIS — G8194 Hemiplegia, unspecified affecting left nondominant side: Secondary | ICD-10-CM | POA: Diagnosis not present

## 2016-12-02 DIAGNOSIS — Z813 Family history of other psychoactive substance abuse and dependence: Secondary | ICD-10-CM | POA: Diagnosis not present

## 2016-12-02 DIAGNOSIS — D573 Sickle-cell trait: Secondary | ICD-10-CM | POA: Diagnosis not present

## 2016-12-02 DIAGNOSIS — Z915 Personal history of self-harm: Secondary | ICD-10-CM

## 2016-12-02 DIAGNOSIS — Z823 Family history of stroke: Secondary | ICD-10-CM | POA: Diagnosis not present

## 2016-12-02 DIAGNOSIS — F319 Bipolar disorder, unspecified: Secondary | ICD-10-CM | POA: Diagnosis not present

## 2016-12-02 DIAGNOSIS — K222 Esophageal obstruction: Secondary | ICD-10-CM | POA: Diagnosis present

## 2016-12-02 DIAGNOSIS — K219 Gastro-esophageal reflux disease without esophagitis: Secondary | ICD-10-CM | POA: Diagnosis not present

## 2016-12-02 DIAGNOSIS — R911 Solitary pulmonary nodule: Secondary | ICD-10-CM | POA: Diagnosis not present

## 2016-12-02 DIAGNOSIS — E538 Deficiency of other specified B group vitamins: Secondary | ICD-10-CM | POA: Diagnosis not present

## 2016-12-02 DIAGNOSIS — Z800 Family history of malignant neoplasm of digestive organs: Secondary | ICD-10-CM

## 2016-12-02 DIAGNOSIS — R471 Dysarthria and anarthria: Secondary | ICD-10-CM | POA: Diagnosis present

## 2016-12-02 DIAGNOSIS — Z9049 Acquired absence of other specified parts of digestive tract: Secondary | ICD-10-CM | POA: Diagnosis not present

## 2016-12-02 DIAGNOSIS — Z90710 Acquired absence of both cervix and uterus: Secondary | ICD-10-CM | POA: Diagnosis not present

## 2016-12-02 DIAGNOSIS — R29898 Other symptoms and signs involving the musculoskeletal system: Secondary | ICD-10-CM | POA: Diagnosis present

## 2016-12-02 DIAGNOSIS — Z23 Encounter for immunization: Secondary | ICD-10-CM

## 2016-12-02 DIAGNOSIS — R531 Weakness: Secondary | ICD-10-CM

## 2016-12-02 DIAGNOSIS — E039 Hypothyroidism, unspecified: Secondary | ICD-10-CM | POA: Diagnosis not present

## 2016-12-02 DIAGNOSIS — I10 Essential (primary) hypertension: Secondary | ICD-10-CM | POA: Diagnosis present

## 2016-12-02 DIAGNOSIS — E876 Hypokalemia: Secondary | ICD-10-CM | POA: Diagnosis present

## 2016-12-02 DIAGNOSIS — Z885 Allergy status to narcotic agent status: Secondary | ICD-10-CM | POA: Diagnosis not present

## 2016-12-02 DIAGNOSIS — Z888 Allergy status to other drugs, medicaments and biological substances status: Secondary | ICD-10-CM

## 2016-12-02 DIAGNOSIS — M5090 Cervical disc disorder, unspecified, unspecified cervical region: Secondary | ICD-10-CM | POA: Diagnosis present

## 2016-12-02 DIAGNOSIS — F1411 Cocaine abuse, in remission: Secondary | ICD-10-CM

## 2016-12-02 DIAGNOSIS — Z808 Family history of malignant neoplasm of other organs or systems: Secondary | ICD-10-CM | POA: Diagnosis not present

## 2016-12-02 DIAGNOSIS — F17210 Nicotine dependence, cigarettes, uncomplicated: Secondary | ICD-10-CM | POA: Diagnosis not present

## 2016-12-02 DIAGNOSIS — S31609S Unspecified open wound of abdominal wall, unspecified quadrant with penetration into peritoneal cavity, sequela: Secondary | ICD-10-CM | POA: Diagnosis not present

## 2016-12-02 DIAGNOSIS — Z87898 Personal history of other specified conditions: Secondary | ICD-10-CM | POA: Diagnosis not present

## 2016-12-02 DIAGNOSIS — Z818 Family history of other mental and behavioral disorders: Secondary | ICD-10-CM

## 2016-12-02 DIAGNOSIS — Z79899 Other long term (current) drug therapy: Secondary | ICD-10-CM

## 2016-12-02 DIAGNOSIS — I639 Cerebral infarction, unspecified: Secondary | ICD-10-CM | POA: Diagnosis not present

## 2016-12-02 DIAGNOSIS — F1410 Cocaine abuse, uncomplicated: Secondary | ICD-10-CM | POA: Diagnosis present

## 2016-12-02 DIAGNOSIS — R270 Ataxia, unspecified: Secondary | ICD-10-CM | POA: Diagnosis not present

## 2016-12-02 DIAGNOSIS — K5900 Constipation, unspecified: Secondary | ICD-10-CM | POA: Diagnosis present

## 2016-12-02 DIAGNOSIS — M4802 Spinal stenosis, cervical region: Secondary | ICD-10-CM | POA: Diagnosis not present

## 2016-12-02 DIAGNOSIS — R69 Illness, unspecified: Secondary | ICD-10-CM | POA: Diagnosis not present

## 2016-12-02 DIAGNOSIS — Z765 Malingerer [conscious simulation]: Secondary | ICD-10-CM | POA: Diagnosis not present

## 2016-12-02 DIAGNOSIS — M6281 Muscle weakness (generalized): Secondary | ICD-10-CM | POA: Diagnosis not present

## 2016-12-02 LAB — DIFFERENTIAL
BASOS ABS: 0 10*3/uL (ref 0.0–0.1)
Basophils Relative: 0 %
EOS ABS: 0.3 10*3/uL (ref 0.0–0.7)
Eosinophils Relative: 3 %
LYMPHS ABS: 3.5 10*3/uL (ref 0.7–4.0)
Lymphocytes Relative: 39 %
MONOS PCT: 10 %
Monocytes Absolute: 0.9 10*3/uL (ref 0.1–1.0)
NEUTROS PCT: 48 %
Neutro Abs: 4.3 10*3/uL (ref 1.7–7.7)

## 2016-12-02 LAB — URINALYSIS, ROUTINE W REFLEX MICROSCOPIC
Bilirubin Urine: NEGATIVE
Glucose, UA: NEGATIVE mg/dL
HGB URINE DIPSTICK: NEGATIVE
Ketones, ur: NEGATIVE mg/dL
Leukocytes, UA: NEGATIVE
NITRITE: NEGATIVE
PROTEIN: NEGATIVE mg/dL
Specific Gravity, Urine: 1.003 — ABNORMAL LOW (ref 1.005–1.030)
pH: 5 (ref 5.0–8.0)

## 2016-12-02 LAB — I-STAT CHEM 8, ED
BUN: 8 mg/dL (ref 6–20)
Calcium, Ion: 1.23 mmol/L (ref 1.15–1.40)
Chloride: 103 mmol/L (ref 101–111)
Creatinine, Ser: 0.4 mg/dL — ABNORMAL LOW (ref 0.44–1.00)
Glucose, Bld: 88 mg/dL (ref 65–99)
HCT: 30 % — ABNORMAL LOW (ref 36.0–46.0)
Hemoglobin: 10.2 g/dL — ABNORMAL LOW (ref 12.0–15.0)
Potassium: 4.1 mmol/L (ref 3.5–5.1)
SODIUM: 138 mmol/L (ref 135–145)
TCO2: 27 mmol/L (ref 22–32)

## 2016-12-02 LAB — COMPREHENSIVE METABOLIC PANEL
ALBUMIN: 3.3 g/dL — AB (ref 3.5–5.0)
ALT: 22 U/L (ref 14–54)
ANION GAP: 6 (ref 5–15)
AST: 24 U/L (ref 15–41)
Alkaline Phosphatase: 98 U/L (ref 38–126)
BUN: 9 mg/dL (ref 6–20)
CALCIUM: 9.5 mg/dL (ref 8.9–10.3)
CO2: 26 mmol/L (ref 22–32)
CREATININE: 0.43 mg/dL — AB (ref 0.44–1.00)
Chloride: 104 mmol/L (ref 101–111)
GFR calc non Af Amer: >60 mL/min (ref >60)
GLUCOSE: 92 mg/dL (ref 65–99)
POTASSIUM: 3.7 mmol/L (ref 3.5–5.1)
Sodium: 136 mmol/L (ref 135–145)
TOTAL PROTEIN: 7.1 g/dL (ref 6.5–8.1)
Total Bilirubin: 0.3 mg/dL (ref 0.3–1.2)

## 2016-12-02 LAB — CBC
HCT: 28.4 % — ABNORMAL LOW (ref 36.0–46.0)
HEMOGLOBIN: 10 g/dL — AB (ref 12.0–15.0)
MCH: 27.6 pg (ref 26.0–34.0)
MCHC: 35.2 g/dL (ref 30.0–36.0)
MCV: 78.5 fL (ref 78.0–100.0)
PLATELETS: 411 10*3/uL — AB (ref 150–400)
RBC: 3.62 MIL/uL — ABNORMAL LOW (ref 3.87–5.11)
RDW: 15.4 % (ref 11.5–15.5)
WBC: 9 10*3/uL (ref 4.0–10.5)

## 2016-12-02 LAB — APTT: aPTT: 31 s (ref 24–36)

## 2016-12-02 LAB — RAPID URINE DRUG SCREEN, HOSP PERFORMED
Amphetamines: NOT DETECTED
BARBITURATES: NOT DETECTED
BENZODIAZEPINES: NOT DETECTED
Cocaine: NOT DETECTED
Opiates: NOT DETECTED
Tetrahydrocannabinol: NOT DETECTED

## 2016-12-02 LAB — I-STAT BETA HCG BLOOD, ED (MC, WL, AP ONLY)

## 2016-12-02 LAB — I-STAT TROPONIN, ED: TROPONIN I, POC: 0 ng/mL (ref 0.00–0.08)

## 2016-12-02 LAB — PROTIME-INR
INR: 0.95
PROTHROMBIN TIME: 12.6 s (ref 11.4–15.2)

## 2016-12-02 LAB — ETHANOL: Alcohol, Ethyl (B): <5 mg/dL (ref <5)

## 2016-12-02 MED ORDER — LISINOPRIL 10 MG PO TABS
20.0000 mg | ORAL_TABLET | Freq: Every day | ORAL | Status: DC
  Administered 2016-12-03 – 2016-12-05 (×2): 20 mg via ORAL
  Filled 2016-12-02 (×3): qty 2

## 2016-12-02 MED ORDER — SUCRALFATE 1 G PO TABS
1.0000 g | ORAL_TABLET | Freq: Three times a day (TID) | ORAL | Status: DC
  Administered 2016-12-02 – 2016-12-05 (×9): 1 g via ORAL
  Filled 2016-12-02 (×12): qty 1

## 2016-12-02 MED ORDER — HYDRALAZINE HCL 20 MG/ML IJ SOLN: 10.0000 mg | Freq: Three times a day (TID) | INTRAMUSCULAR | Status: DC | PRN

## 2016-12-02 MED ORDER — ONDANSETRON HCL 4 MG/2ML IJ SOLN
4.0000 mg | Freq: Once | INTRAMUSCULAR | Status: AC
  Administered 2016-12-02: 4 mg via INTRAVENOUS
  Filled 2016-12-02: qty 2

## 2016-12-02 MED ORDER — ACETAMINOPHEN 325 MG PO TABS: 650.0000 mg | ORAL_TABLET | ORAL | Status: DC | PRN

## 2016-12-02 MED ORDER — PNEUMOCOCCAL VAC POLYVALENT 25 MCG/0.5ML IJ INJ
0.5000 mL | INJECTION | INTRAMUSCULAR | Status: AC
  Administered 2016-12-03: 0.5 mL via INTRAMUSCULAR
  Filled 2016-12-02: qty 0.5

## 2016-12-02 MED ORDER — ACETAMINOPHEN 325 MG PO TABS
650.0000 mg | ORAL_TABLET | Freq: Once | ORAL | Status: AC
  Administered 2016-12-02: 650 mg via ORAL
  Filled 2016-12-02: qty 2

## 2016-12-02 MED ORDER — SODIUM CHLORIDE 0.9 % IV SOLN
INTRAVENOUS | Status: DC
  Administered 2016-12-02: 23:00:00 via INTRAVENOUS

## 2016-12-02 MED ORDER — DIPHENHYDRAMINE HCL 25 MG PO CAPS
25.0000 mg | ORAL_CAPSULE | Freq: Four times a day (QID) | ORAL | Status: DC | PRN
  Administered 2016-12-03 – 2016-12-04 (×2): 25 mg via ORAL
  Filled 2016-12-02 (×2): qty 1

## 2016-12-02 MED ORDER — LORAZEPAM 2 MG/ML IJ SOLN
INTRAMUSCULAR | Status: AC
  Filled 2016-12-02: qty 1

## 2016-12-02 MED ORDER — ACETAMINOPHEN 650 MG RE SUPP: 650.0000 mg | RECTAL | Status: DC | PRN

## 2016-12-02 MED ORDER — LORAZEPAM 2 MG/ML IJ SOLN
1.0000 mg | Freq: Once | INTRAMUSCULAR | Status: AC
  Administered 2016-12-02: 1 mg via INTRAVENOUS

## 2016-12-02 MED ORDER — INFLUENZA VAC SPLIT QUAD 0.5 ML IM SUSY
0.5000 mL | PREFILLED_SYRINGE | INTRAMUSCULAR | Status: AC
  Administered 2016-12-03: 0.5 mL via INTRAMUSCULAR
  Filled 2016-12-02: qty 0.5

## 2016-12-02 MED ORDER — ACETAMINOPHEN 160 MG/5ML PO SOLN: 650.0000 mg | ORAL | Status: DC | PRN

## 2016-12-02 MED ORDER — STROKE: EARLY STAGES OF RECOVERY BOOK
Freq: Once | Status: DC
  Filled 2016-12-02: qty 1

## 2016-12-02 MED ORDER — HYDROMORPHONE HCL 1 MG/ML IJ SOLN
0.5000 mg | Freq: Once | INTRAMUSCULAR | Status: AC
  Administered 2016-12-02: 0.5 mg via INTRAVENOUS
  Filled 2016-12-02: qty 1

## 2016-12-02 MED ORDER — OXYCODONE-ACETAMINOPHEN 5-325 MG PO TABS
1.0000 | ORAL_TABLET | Freq: Four times a day (QID) | ORAL | Status: DC | PRN
  Administered 2016-12-02 – 2016-12-05 (×7): 1 via ORAL
  Filled 2016-12-02 (×8): qty 1

## 2016-12-02 MED ORDER — VITAMIN B-12 1000 MCG PO TABS
5000.0000 ug | ORAL_TABLET | Freq: Every day | ORAL | Status: DC
  Administered 2016-12-03 – 2016-12-05 (×2): 5000 ug via ORAL
  Filled 2016-12-02 (×3): qty 5

## 2016-12-02 MED ORDER — CLOPIDOGREL BISULFATE 75 MG PO TABS
75.0000 mg | ORAL_TABLET | Freq: Every day | ORAL | Status: DC
  Administered 2016-12-02 – 2016-12-04 (×3): 75 mg via ORAL
  Filled 2016-12-02 (×3): qty 1

## 2016-12-02 MED ORDER — HEPARIN SODIUM (PORCINE) 5000 UNIT/ML IJ SOLN
5000.0000 [IU] | Freq: Three times a day (TID) | INTRAMUSCULAR | Status: DC
  Administered 2016-12-02 – 2016-12-03 (×4): 5000 [IU] via SUBCUTANEOUS
  Filled 2016-12-02 (×4): qty 1

## 2016-12-02 MED ORDER — SENNOSIDES-DOCUSATE SODIUM 8.6-50 MG PO TABS: 1.0000 | ORAL_TABLET | Freq: Every evening | ORAL | Status: DC | PRN

## 2016-12-02 MED ORDER — PANTOPRAZOLE SODIUM 20 MG PO TBEC
20.0000 mg | DELAYED_RELEASE_TABLET | Freq: Every day | ORAL | Status: DC
  Filled 2016-12-02 (×2): qty 1

## 2016-12-02 MED ORDER — POLYETHYLENE GLYCOL 3350 17 G PO PACK: 17.0000 g | PACK | Freq: Every day | ORAL | Status: DC | PRN

## 2016-12-02 MED ORDER — ASPIRIN 81 MG PO CHEW
324.0000 mg | CHEWABLE_TABLET | Freq: Once | ORAL | Status: AC
  Administered 2016-12-02: 324 mg via ORAL
  Filled 2016-12-02: qty 4

## 2016-12-02 NOTE — ED Notes (Signed)
Patient reports of needing to be sedated for MRI. Spoke to Dr. Roderic Palau, and given new orders.

## 2016-12-02 NOTE — ED Provider Notes (Signed)
[facility] DEPT Provider Note   CSN: [id number] Arrival date & time: 12/02/16  1503     History   Chief Complaint Chief Complaint  Patient presents with  . Extremity Weakness    HPI Angel Price is a 51 y.o. female.  Patient complains of waking up today and unable to lift her left leg. She was admitted with weakness in her left arm couple weeks ago and states that this has gotten worse. She was worked up with a CT of the head and MRI of the head and a CT angiogram of the head and neck all were negative   The history is provided by the patient.  Extremity Weakness  This is a new problem. The current episode started 12 to 24 hours ago. The problem occurs constantly. The problem has not changed since onset.Pertinent negatives include no chest pain, no abdominal pain and no headaches. Nothing aggravates the symptoms. Nothing relieves the symptoms. She has tried nothing for the symptoms. The treatment provided mild relief.    Past Medical History:  Diagnosis Date  . Abscess    soft tissue  . Adrenal mass ([place])   . Alcohol abuse   . Anxiety   . Blood transfusion without reported diagnosis   . Chronic abdominal pain   . Chronic wound infection of abdomen   . Colon polyp    colonoscopy 04/2014  . Depression   . Diverticulosis    colonoscopy 04/2014 moderat pan colonic  . Gastritis    EGD 05/2014  . Gastroparesis Nov 2015  . GERD (gastroesophageal reflux disease)   . Hemorrhoid    internal large  . Hiatal hernia   . History of Billroth II operation   . Hypertension   . Lung nodule    CT 02/2014 needs repeat 1 month  . Lung nodule < 6cm on CT 04/25/2014  . Lupus   . Nausea and vomiting    chronic, recurrent  . Schatzki's ring    patent per EGD 04/2014  . Sickle cell trait ([place])   . Suicide attempt ([place])   . 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 Weakness of left lower extremity 12/02/2016  . CVA  (cerebral vascular accident) ([place]) 11/23/2016  . Bright red rectal bleeding 08/22/2016  . Hypotension due to blood loss   . Acute GI bleeding 05/24/2016  . Dieulafoy lesion of duodenum   . History of Billroth II operation   . Gastrointestinal hemorrhage 05/22/2016  . Absolute anemia   . Pancreatitis, acute   . Acute pancreatitis 10/01/2015  . Abnormal CT scan of lung 10/01/2015  . Alcohol intoxication ([place])   . Left-sided weakness   . Psychosomatic factor in physical condition   . Upper GI bleed   . Diverticulosis of colon with hemorrhage   . Chronic wound infection of abdomen 12/22/2014  . Thyroid disease 12/22/2014  . Hypertension 12/22/2014  . Ataxia 11/01/2014  . Hemorrhoids 04/26/2014  . Lung nodule 04/26/2014  . Diverticulosis   . Gastritis   . Hiatal hernia   . Schatzki's ring   . Acute blood loss anemia 04/25/2014  . Sinus tachycardia 04/25/2014  . Hypokalemia 04/25/2014  . Hyponatremia 04/25/2014  . Hematemesis with nausea 04/25/2014  . Lung nodule < 6cm on CT 04/25/2014  . Intractable nausea and vomiting 04/24/2014  . Gastroparesis   . Abdominal pain 04/01/2014  . Chronic abdominal pain 01/21/2014  .  Gastroenteritis 12/10/2013  . Chronic abdominal wound infection 08/16/2013  . MDD (major depressive disorder), recurrent episode, severe ([place]) 06/27/2013  . Wrist laceration 06/24/2013  . Nausea with vomiting 05/07/2013  . Diarrhea 05/07/2013  . Rectal bleeding 05/07/2013  . Abnormal LFTs 05/07/2013  . Adrenal mass, left ([place]) 05/07/2013  . Abdominal wall abscess at site of surgical wound 04/19/2013  . Abdominal wall abscess 04/18/2013  . Frequent headaches 03/30/2013  . Sleep difficulties 03/30/2013  . Essential hypertension, benign 03/30/2013  . History of cocaine abuse 03/16/2013  . History of schizoaffective disorder 03/16/2013  . Bipolar disorder ([place]) 03/16/2013  . Personality disorder 03/16/2013  . Tobacco abuse 03/16/2013  . Alcohol abuse  03/16/2013  . Palpitations 03/16/2013  . Poor vision 03/16/2013  . History of gastric bypass 03/16/2013  . Status post hysterectomy with oophorectomy 03/16/2013  . Hypothyroid 03/16/2013  . Lupus ([place]) 03/16/2013    Past Surgical History:  Procedure Laterality Date  . ABDOMINAL HYSTERECTOMY  2013   [place]  . ABDOMINAL SURGERY    . ADRENALECTOMY Right   . AGILE CAPSULE N/A 01/05/2015   Procedure: AGILE CAPSULE;  Surgeon: Daneil Dolin, MD;  [facility];  Service: Endoscopy;  Laterality: N/A;  0700  . Billroth II procedure      [place], first 2000, 2005/2006.  Marland Kitchen BIOPSY  05/20/2013   Procedure: BIOPSIES OF ASCENDING AND SIGMOID COLON;  Surgeon: Daneil Dolin, MD;  Location: [facility];  Service: Endoscopy;;  . BIOPSY  04/26/2014   Procedure: BIOPSIES;  Surgeon: Danie Binder, MD;  Location: [facility];  Service: Endoscopy;;  . CHOLECYSTECTOMY    . COLONOSCOPY     in [place]  . COLONOSCOPY WITH PROPOFOL N/A 05/20/2013   Dr.Rourk- inadequate prep, normal appearing rectum, grossly normal colon aside from pancolonic diverticula, normal terminal ileum bx= unremarkable colonic mucosa. Due for early interval 2016.   Marland Kitchen COLONOSCOPY WITH PROPOFOL N/A 04/26/2014   WIO:XBDZHG ileum/one colon polyp removed/moderate pan-colonic diverticulosis/large internal hemorrhoids  . COLONOSCOPY WITH PROPOFOL N/A 12/23/2014   Dr.Rourk- minimal internal hemorrhoids, pancolonic diverticulosis  . COLONOSCOPY WITH PROPOFOL N/A 08/23/2016   Procedure: COLONOSCOPY WITH PROPOFOL;  Surgeon: Danie Binder, MD;  [facility];  Service: Endoscopy;  Laterality: N/A;  . DEBRIDEMENT OF ABDOMINAL WALL ABSCESS N/A 02/08/2013   Procedure: DEBRIDEMENT OF ABDOMINAL WALL ABSCESS;  Surgeon: Jamesetta So, MD;  Location: [facility];  Service: General;  Laterality: N/A;  . ESOPHAGOGASTRODUODENOSCOPY (EGD) WITH PROPOFOL N/A 05/20/2013   Dr.Rourk- s/p prior gastric surgery with normal esophagus, residual gastric mucosa and  patent efferent limb  . ESOPHAGOGASTRODUODENOSCOPY (EGD) WITH PROPOFOL N/A 02/03/2014   Dr. Gala Romney:  s/p hemigastrectomy with retained gastric contents. Residual gastric mucosa and efferent limb appeared normal otherwise. Query gastroparesis.   Marland Kitchen ESOPHAGOGASTRODUODENOSCOPY (EGD) WITH PROPOFOL N/A 04/26/2014   DJM:[id number] ring/small HH/mild non-erosive gasrtitis/normal anastomosis  . ESOPHAGOGASTRODUODENOSCOPY (EGD) WITH PROPOFOL N/A 12/23/2014   Dr.Rourk- s/p prior hemigastrctomy, active oozing from anastomotic suture site, hemostasis achieved  . ESOPHAGOGASTRODUODENOSCOPY (EGD) WITH PROPOFOL N/A 05/23/2016   Dr. 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 tendon repar Right    wrist  . WOUND EXPLORATION Right 06/24/2013   Procedure: exploration of traumatic wound right wrist;  Surgeon: Tennis Must, MD;  Location: [facility];  Service: Orthopedics;  Laterality: Right;    OB History    Gravida Para Term Preterm  AB Living   4 4 4     3    SAB TAB Ectopic Multiple Live Births                   Home Medications    Prior to Admission medications   Medication Sig Start Date End Date Taking? Authorizing Provider  Cyanocobalamin (VITAMIN B-12) 2500 MCG TABS Take 5,000 mcg by mouth daily. 11/26/16  Yes Reyne Dumas, MD  lisinopril (PRINIVIL,ZESTRIL) 20 MG tablet Take 1 tablet (20 mg total) by mouth daily. 11/26/16 11/26/17 Yes Reyne Dumas, MD  pantoprazole (PROTONIX) 20 MG tablet Take 1 tablet (20 mg total) by mouth daily. 09/23/16  Yes Francine Graven, DO  polyethylene glycol (MIRALAX / GLYCOLAX) packet Take 17 g by mouth daily as needed for moderate constipation or severe constipation.   Yes [provider]  sucralfate (CARAFATE) 1 g tablet Take 1 tablet (1 g total) by mouth 4 (four) times daily -  with meals and at bedtime. 09/17/16  Yes Milton Ferguson, MD  aspirin EC 81 MG EC tablet Take 2 tablets  (162 mg total) by mouth daily. Patient not taking: Reported on 12/02/2016 11/27/16   Reyne Dumas, MD  hydrocortisone (ANUSOL-HC) 2.5 % rectal cream Apply rectally 2 times daily Patient not taking: Reported on 12/02/2016 10/21/16   Isla Pence, MD  magnesium oxide (MAG-OX) 400 (241.3 Mg) MG tablet Take 1 tablet (400 mg total) by mouth daily. Patient not taking: Reported on 12/02/2016 11/26/16   Reyne Dumas, MD  nicotine (NICODERM CQ - DOSED IN MG/24 HOURS) 21 mg/24hr patch Place 1 patch (21 mg total) onto the skin daily. Patient not taking: Reported on 12/02/2016 11/27/16   Reyne Dumas, MD  potassium chloride SA (K-DUR,KLOR-CON) 20 MEQ tablet Take 1 tablet (20 mEq total) by mouth daily. Patient not taking: Reported on 12/02/2016 10/21/16   Isla Pence, MD    Family History Family History  Problem Relation Age of Onset  . Brain cancer Son   . Schizophrenia Son   . Cancer Son        brain  . Lung cancer Father   . Cancer Father        mets  . Drug abuse Mother   . Breast cancer Maternal Aunt   . Bipolar disorder Maternal Aunt   . Drug abuse Maternal Aunt   . Colon cancer Maternal Grandmother        late 61s, early 18s  . Drug abuse Sister   . Drug abuse Brother   . Bipolar disorder Paternal Grandfather   . Bipolar disorder Cousin   . Liver disease Neg Hx     Social History Social History  Substance Use Topics  . Smoking status: Current Every Day Smoker    Packs/day: 0.50    Years: 35.00    Types: Cigarettes  . Smokeless tobacco: Never Used  . Alcohol use No     Allergies   Codeine; Morphine and related; and Reglan [metoclopramide]   Review of Systems Review of Systems  Constitutional: Negative for appetite change and fatigue.  HENT: Negative for congestion, ear discharge and sinus pressure.   Eyes: Negative for discharge.  Respiratory: Negative for cough.   Cardiovascular: Negative for chest pain.  Gastrointestinal: Negative for abdominal pain and diarrhea.    Genitourinary: Negative for frequency and hematuria.  Musculoskeletal: Positive for extremity weakness. Negative for back pain.  Skin: Negative for rash.  Neurological: Negative for seizures and headaches.  Weakness in left arm and left leg  Psychiatric/Behavioral: Negative for hallucinations.     Physical Exam Updated Vital Signs BP (!) 119/93   Pulse 80   Temp 98.4 F (36.9 C)   Resp 17   Ht 5\' 3"  (1.6 m)   Wt 61.2 kg (135 lb)   SpO2 98%   BMI 23.91 kg/m   Physical Exam  Constitutional: She is oriented to person, place, and time. She appears well-developed.  HENT:  Head: Normocephalic.  Eyes: Conjunctivae and EOM are normal. No scleral icterus.  Neck: Neck supple. No thyromegaly present.  Cardiovascular: Normal rate and regular rhythm.  Exam reveals no gallop and no friction rub.   No murmur heard. Pulmonary/Chest: No stridor. She has no wheezes. She has no rales. She exhibits no tenderness.  Abdominal: She exhibits no distension. There is no tenderness. There is no rebound.  Musculoskeletal: Normal range of motion. She exhibits no edema.  Patient has significantly controlled to left arm she is able to lift the arm minimally and has a mild grip. She states this is worse and it was last week. She is unable to lift her left leg at all but can move her foot minimally  Lymphadenopathy:    She has no cervical adenopathy.  Neurological: She is oriented to person, place, and time. She exhibits normal muscle tone. Coordination normal.  Skin: No rash noted. No erythema.  Psychiatric: She has a normal mood and affect. Her behavior is normal.     ED Treatments / Results  Labs (all labs ordered are listed, but only abnormal results are displayed) Labs Reviewed  CBC - Abnormal; Notable for the following:       Result Value   RBC 3.62 (*)    Hemoglobin 10.0 (*)    HCT 28.4 (*)    Platelets 411 (*)    All other components within normal limits  COMPREHENSIVE METABOLIC  PANEL - Abnormal; Notable for the following:    Creatinine, Ser 0.43 (*)    Albumin 3.3 (*)    All other components within normal limits  URINALYSIS, ROUTINE W REFLEX MICROSCOPIC - Abnormal; Notable for the following:    Color, Urine STRAW (*)    Specific Gravity, Urine 1.003 (*)    All other components within normal limits  I-STAT CHEM 8, ED - Abnormal; Notable for the following:    Creatinine, Ser 0.40 (*)    Hemoglobin 10.2 (*)    HCT 30.0 (*)    All other components within normal limits  ETHANOL  PROTIME-INR  APTT  DIFFERENTIAL  RAPID URINE DRUG SCREEN, HOSP PERFORMED  HEMOGLOBIN A1C  LIPID PANEL  CBC  CREATININE, SERUM  I-STAT TROPONIN, ED  I-STAT BETA HCG BLOOD, ED ([facility], [facility], [facility] ONLY)    EKG  EKG Interpretation None       Radiology Ct Head Wo Contrast  Result Date: 12/02/2016 CLINICAL DATA:  Left-sided weakness EXAM: CT HEAD WITHOUT CONTRAST TECHNIQUE: Contiguous axial images were obtained from the base of the skull through the vertex without intravenous contrast. COMPARISON:  November 26, 2016 head CT; brain MRI November 26, 2016 FINDINGS: Brain: The ventricles are normal in size and configuration. Mild prominence of the cisterna magna is a stable anatomic variant. There is no intracranial mass, hemorrhage, extra-axial fluid collection, or midline shift. No focal gray-white compartment lesions are demonstrated on this study. No acute infarct is demonstrable. Vascular: There is no demonstrable hyperdense vessel. There is calcification in both carotid siphon regions.  Skull: The bony calvarium appears intact. Sinuses/Orbits: There is mild mucosal thickening in several ethmoid air cells bilaterally. Frontal sinuses are hypoplastic. Visualized paranasal sinuses elsewhere clear. Visualized orbits appear symmetric bilaterally. Other: Mastoid air cells are clear. IMPRESSION: No evident intracranial mass, hemorrhage, or extra-axial fluid collection. No focal gray-white compartment  lesion is demonstrated on this study. Given patient's clinical symptoms, correlation with MR with diffusion imaging may well be advisable. There are foci of arterial vascular calcification. There is ethmoid sinus mucosal thickening at several levels. Electronically Signed   By: Lowella Grip III M.D.   On: 12/02/2016 16:10   Mr Brain Wo Contrast  Result Date: 12/02/2016 CLINICAL DATA:  Initial evaluation for acute left-sided weakness for 2 days. EXAM: MRI HEAD WITHOUT CONTRAST TECHNIQUE: Multiplanar, multiecho pulse sequences of the brain and surrounding structures were obtained without intravenous contrast. COMPARISON:  Prior CT from earlier the same day as well as previous MRI from 11/23/2016. FINDINGS: Brain: Cerebral volume within normal limits. No focal parenchymal signal abnormality. No abnormal foci of restricted diffusion to suggest acute or subacute ischemia. Gray-white matter differentiation well maintained. No encephalomalacia to suggest chronic infarction. No susceptibility fact to suggest acute or chronic intracranial hemorrhage. No mass lesion, midline shift or mass effect. No hydrocephalus. No extra-axial fluid collection. Major dural sinuses are patent. Incidental note made of a partially empty sella. Midline structures intact and normal. Vascular: Major intracranial vascular flow voids are well maintained. Skull and upper cervical spine: Craniocervical junction normal. Visualized upper cervical spine unremarkable. Bone marrow signal intensity within normal limits. No scalp soft tissue abnormality. Sinuses/Orbits: Globes and orbital soft tissues within normal limits. Visualized paranasal sinuses are clear. No significant mastoid effusion. Inner ear structures normal. Other: None. IMPRESSION: Normal MRI of the brain. No acute intracranial abnormality identified. Electronically Signed   By: Jeannine Boga M.D.   On: 12/02/2016 18:21    Procedures Procedures (including critical care  time)  Medications Ordered in ED Medications  LORazepam (ATIVAN) 2 MG/ML injection (not administered)  B-12 TABS 5,000 mcg (not administered)  lisinopril (PRINIVIL,ZESTRIL) tablet 20 mg (not administered)  pantoprazole (PROTONIX) EC tablet 20 mg (not administered)  polyethylene glycol (MIRALAX / GLYCOLAX) packet 17 g (not administered)  sucralfate (CARAFATE) tablet 1 g (not administered)  hydrALAZINE (APRESOLINE) injection 10 mg (not administered)   stroke: mapping our early stages of recovery book (not administered)  0.9 %  sodium chloride infusion (not administered)  acetaminophen (TYLENOL) tablet 650 mg (not administered)    Or  acetaminophen (TYLENOL) solution 650 mg (not administered)    Or  acetaminophen (TYLENOL) suppository 650 mg (not administered)  senna-docusate (Senokot-S) tablet 1 tablet (not administered)  heparin injection 5,000 Units (not administered)  ondansetron (ZOFRAN) injection 4 mg (4 mg Intravenous Given 12/02/16 1707)  acetaminophen (TYLENOL) tablet 650 mg (650 mg Oral Given 12/02/16 1707)  LORazepam (ATIVAN) injection 1 mg (1 mg Intravenous Given 12/02/16 1715)  HYDROmorphone (DILAUDID) injection 0.5 mg (0.5 mg Intravenous Given 12/02/16 1846)     Initial Impression / Assessment and Plan / ED Course  I have reviewed the triage vital signs and the nursing notes.  Pertinent labs & imaging results that were available during my care of the patient were reviewed by me and considered in my medical decision making (see chart for details).     I spoke with the neurologist Dr.[place] and he stated to get a CT head and an MRI of her head.   These are negative he stated  we could start her on Plavix 75 mg a day. He did not call a code stroke on the patient since she was in the hospital had a complete workup week ago. Patient MRI and CT were negative. Although she still has weakness in her left side. She will be admitted to medicine and neurology will see her tomorrow  morning.  Final Clinical Impressions(s) / ED Diagnoses   Final diagnoses:  Weakness    New Prescriptions New Prescriptions   No medications on file     Milton Ferguson, MD 12/02/16 1941

## 2016-12-02 NOTE — ED Notes (Signed)
Patient requesting something for pain and nausea. EDP made aware. Verbal orders obtained from Dr. Roderic Palau.

## 2016-12-02 NOTE — H&P (Addendum)
[facility] History and Physical  Angel Price KGU:[id number] DOB: 1965-08-17 DOA: 12/02/2016  Referring physician:  PCP: Rosita Fire, MD   Chief Complaint: "I've been having more trouble moving my foot."  HPI: Angel Price is a 51 y.o. female  with past medical history significant for adrenal mass, etoh abuse, depression, gastroparesis, thyroid disease and suicide attempt since the emergency room chief complaint of left lower extremity weakness. Patient states that over the last week she's had a gradual onset of a headache. Severe headache. In addition she's had worsening of her left upper extremity weakness. His sudden onset of left lower extremity weakness earlier today. Patient states she has a family history of strokes. Mother had a stroke. Maternal grandmother had stroke. Her brother has had a stroke.  Patient initially with her primary care physician's office for evaluation. Carried by her significant other because she can walk. She was directed to the ER for care.   ED course: EDP consult neurology who advised MRI. MRI negative. Neurology advised Plavix with hospital stay. Hospitalist consulted for admission.   Review of Systems:  As per HPI otherwise 10 point review of systems negative.    Past Medical History:  Diagnosis Date  . Abscess    soft tissue  . Adrenal mass ([place])   . Alcohol abuse   . Anxiety   . Blood transfusion without reported diagnosis   . Chronic abdominal pain   . Chronic wound infection of abdomen   . Colon polyp    colonoscopy 04/2014  . Depression   . Diverticulosis    colonoscopy 04/2014 moderat pan colonic  . Gastritis    EGD 05/2014  . Gastroparesis Nov 2015  . GERD (gastroesophageal reflux disease)   . Hemorrhoid    internal large  . Hiatal hernia   . History of Billroth II operation   . Hypertension   . Lung nodule    CT 02/2014 needs repeat 1 month  . Lung nodule < 6cm on CT 04/25/2014  . Lupus   . Nausea and vomiting      chronic, recurrent  . Schatzki's ring    patent per EGD 04/2014  . Sickle cell trait ([place])   . Suicide attempt ([place])   . Thyroid disease 2000   overactive, radiation   Past Surgical History:  Procedure Laterality Date  . ABDOMINAL HYSTERECTOMY  2013   [place]  . ABDOMINAL SURGERY    . ADRENALECTOMY Right   . AGILE CAPSULE N/A 01/05/2015   Procedure: AGILE CAPSULE;  Surgeon: Daneil Dolin, MD;  [facility]: [facility];  Service: Endoscopy;  Laterality: N/A;  0700  . Billroth II procedure      [place], first 2000, 2005/2006.  Marland Kitchen BIOPSY  05/20/2013   Procedure: BIOPSIES OF ASCENDING AND SIGMOID COLON;  Surgeon: Daneil Dolin, MD;  Location: [facility];  Service: Endoscopy;;  . BIOPSY  04/26/2014   Procedure: BIOPSIES;  Surgeon: Danie Binder, MD;  Location: [facility];  Service: Endoscopy;;  . CHOLECYSTECTOMY    . COLONOSCOPY     in [place]  . COLONOSCOPY WITH PROPOFOL N/A 05/20/2013   Dr.Rourk- inadequate prep, normal appearing rectum, grossly normal colon aside from pancolonic diverticula, normal terminal ileum bx= unremarkable colonic mucosa. Due for early interval 2016.   Marland Kitchen COLONOSCOPY WITH PROPOFOL N/A 04/26/2014   SEG:BTDVVO ileum/one colon polyp removed/moderate pan-colonic diverticulosis/large internal hemorrhoids  . COLONOSCOPY WITH PROPOFOL N/A 12/23/2014   Dr.Rourk- minimal internal hemorrhoids, pancolonic diverticulosis  .  COLONOSCOPY WITH PROPOFOL N/A 08/23/2016   Procedure: COLONOSCOPY WITH PROPOFOL;  Surgeon: Danie Binder, MD;  [facility];  Service: Endoscopy;  Laterality: N/A;  . DEBRIDEMENT OF ABDOMINAL WALL ABSCESS N/A 02/08/2013   Procedure: DEBRIDEMENT OF ABDOMINAL WALL ABSCESS;  Surgeon: Jamesetta So, MD;  Location: [facility];  Service: General;  Laterality: N/A;  . ESOPHAGOGASTRODUODENOSCOPY (EGD) WITH PROPOFOL N/A 05/20/2013   Dr.Rourk- s/p prior gastric surgery with normal esophagus, residual gastric mucosa and patent efferent limb  .  ESOPHAGOGASTRODUODENOSCOPY (EGD) WITH PROPOFOL N/A 02/03/2014   Dr. Gala Romney:  s/p hemigastrectomy with retained gastric contents. Residual gastric mucosa and efferent limb appeared normal otherwise. Query gastroparesis.   Marland Kitchen ESOPHAGOGASTRODUODENOSCOPY (EGD) WITH PROPOFOL N/A 04/26/2014   VPX:TGGYIRSW'N ring/small HH/mild non-erosive gasrtitis/normal anastomosis  . ESOPHAGOGASTRODUODENOSCOPY (EGD) WITH PROPOFOL N/A 12/23/2014   Dr.Rourk- s/p prior hemigastrctomy, active oozing from anastomotic suture site, hemostasis achieved  . ESOPHAGOGASTRODUODENOSCOPY (EGD) WITH PROPOFOL N/A 05/23/2016   Dr. 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 tendon repar Right    wrist  . WOUND EXPLORATION Right 06/24/2013   Procedure: exploration of traumatic wound right wrist;  Surgeon: Tennis Must, MD;  Location: [facility];  Service: Orthopedics;  Laterality: Right;   Social History:  reports that she has been smoking Cigarettes.  She has a 17.50 pack-year smoking history. She has never used smokeless tobacco. She reports that she does not drink alcohol or use drugs.  Allergies  Allergen Reactions  . Codeine Hives  . Morphine And Related Hives  . Reglan [Metoclopramide] Other (See Comments)    EPS symptoms     Family History  Problem Relation Age of Onset  . Brain cancer Son   . Schizophrenia Son   . Cancer Son        brain  . Lung cancer Father   . Cancer Father        mets  . Drug abuse Mother   . Breast cancer Maternal Aunt   . Bipolar disorder Maternal Aunt   . Drug abuse Maternal Aunt   . Colon cancer Maternal Grandmother        late 21s, early 35s  . Drug abuse Sister   . Drug abuse Brother   . Bipolar disorder Paternal Grandfather   . Bipolar disorder Cousin   . Liver disease Neg Hx      Prior to Admission medications   Medication Sig Start Date End Date Taking? Authorizing Provider  Cyanocobalamin  (VITAMIN B-12) 2500 MCG TABS Take 5,000 mcg by mouth daily. 11/26/16  Yes Reyne Dumas, MD  lisinopril (PRINIVIL,ZESTRIL) 20 MG tablet Take 1 tablet (20 mg total) by mouth daily. 11/26/16 11/26/17 Yes Reyne Dumas, MD  pantoprazole (PROTONIX) 20 MG tablet Take 1 tablet (20 mg total) by mouth daily. 09/23/16  Yes Francine Graven, DO  polyethylene glycol (MIRALAX / GLYCOLAX) packet Take 17 g by mouth daily as needed for moderate constipation or severe constipation.   Yes [provider]  sucralfate (CARAFATE) 1 g tablet Take 1 tablet (1 g total) by mouth 4 (four) times daily -  with meals and at bedtime. 09/17/16  Yes Milton Ferguson, MD  aspirin EC 81 MG EC tablet Take 2 tablets (162 mg total) by mouth daily. Patient not taking: Reported on 12/02/2016 11/27/16   Reyne Dumas, MD  hydrocortisone (ANUSOL-HC) 2.5 % rectal cream Apply rectally 2 times daily Patient  not taking: Reported on 12/02/2016 10/21/16   Isla Pence, MD  magnesium oxide (MAG-OX) 400 (241.3 Mg) MG tablet Take 1 tablet (400 mg total) by mouth daily. Patient not taking: Reported on 12/02/2016 11/26/16   Reyne Dumas, MD  nicotine (NICODERM CQ - DOSED IN MG/24 HOURS) 21 mg/24hr patch Place 1 patch (21 mg total) onto the skin daily. Patient not taking: Reported on 12/02/2016 11/27/16   Reyne Dumas, MD  potassium chloride SA (K-DUR,KLOR-CON) 20 MEQ tablet Take 1 tablet (20 mEq total) by mouth daily. Patient not taking: Reported on 12/02/2016 10/21/16   Isla Pence, MD   Physical Exam: Vitals:   12/02/16 1700 12/02/16 1716 12/02/16 1830 12/02/16 1900  BP: (!) 134/96  125/89 (!) 119/93  Pulse: 80  80   Resp: (!) 23  (!) 24 17  Temp:  98.4 F (36.9 C)    TempSrc:      SpO2: 100%  98%   Weight:      Height:        Wt Readings from Last 3 Encounters:  12/02/16 61.2 kg (135 lb)  11/23/16 61.6 kg (135 lb 14.4 oz)  11/01/16 62.1 kg (137 lb)    General:  Appears calm and comfortable; A&Ox3 Eyes:  PERRL, EOMI, normal lids,  iris ENT:  grossly normal hearing, lips & tongue Neck:  no LAD, masses or thyromegaly Cardiovascular:  RRR, no m/r/g. No LE edema.  Respiratory:  CTA bilaterally, no w/r/r. Normal respiratory effort. Abdomen:  soft, ntnd; 2 draining fistulas in the midline lower abdomen cover with bandage Skin:  no rash or induration seen on limited exam Musculoskeletal:  grossly normal tone BUE/BLE except L ankle mildly swollen Psychiatric:  grossly normal mood and affect, speech fluent and appropriate Neurologic:  CN 2-12 grossly intact, moves right side extremities in coordinated fashion. L side hemiparesis but sensation intact          Labs on Admission:  Basic Metabolic Panel:  Recent Labs Lab 12/02/16 1554 12/02/16 1608  NA 136 138  K 3.7 4.1  CL 104 103  CO2 26  --   GLUCOSE 92 88  BUN 9 8  CREATININE 0.43* 0.40*  CALCIUM 9.5  --    Liver Function Tests:  Recent Labs Lab 12/02/16 1554  AST 24  ALT 22  ALKPHOS 98  BILITOT 0.3  PROT 7.1  ALBUMIN 3.3*   No results for input(s): LIPASE, AMYLASE in the last 168 hours. No results for input(s): AMMONIA in the last 168 hours. CBC:  Recent Labs Lab 12/02/16 1554 12/02/16 1608  WBC 9.0  --   NEUTROABS 4.3  --   HGB 10.0* 10.2*  HCT 28.4* 30.0*  MCV 78.5  --   PLT 411*  --    Cardiac Enzymes: No results for input(s): CKTOTAL, CKMB, CKMBINDEX, TROPONINI in the last 168 hours.  BNP (last 3 results) No results for input(s): BNP in the last 8760 hours.  ProBNP (last 3 results) No results for input(s): PROBNP in the last 8760 hours.   Serum creatinine: 0.4 mg/dL (L) 12/02/16 1608 Estimated creatinine clearance: 70.4 mL/min (A)  CBG: No results for input(s): GLUCAP in the last 168 hours.  Radiological Exams on Admission: Ct Head Wo Contrast  Result Date: 12/02/2016 CLINICAL DATA:  Left-sided weakness EXAM: CT HEAD WITHOUT CONTRAST TECHNIQUE: Contiguous axial images were obtained from the base of the skull through the  vertex without intravenous contrast. COMPARISON:  November 26, 2016 head CT; brain MRI November 26, 2016 FINDINGS: Brain: The ventricles are normal in size and configuration. Mild prominence of the cisterna magna is a stable anatomic variant. There is no intracranial mass, hemorrhage, extra-axial fluid collection, or midline shift. No focal gray-white compartment lesions are demonstrated on this study. No acute infarct is demonstrable. Vascular: There is no demonstrable hyperdense vessel. There is calcification in both carotid siphon regions. Skull: The bony calvarium appears intact. Sinuses/Orbits: There is mild mucosal thickening in several ethmoid air cells bilaterally. Frontal sinuses are hypoplastic. Visualized paranasal sinuses elsewhere clear. Visualized orbits appear symmetric bilaterally. Other: Mastoid air cells are clear. IMPRESSION: No evident intracranial mass, hemorrhage, or extra-axial fluid collection. No focal gray-white compartment lesion is demonstrated on this study. Given patient's clinical symptoms, correlation with MR with diffusion imaging may well be advisable. There are foci of arterial vascular calcification. There is ethmoid sinus mucosal thickening at several levels. Electronically Signed   By: Lowella Grip III M.D.   On: 12/02/2016 16:10   Mr Brain Wo Contrast  Result Date: 12/02/2016 CLINICAL DATA:  Initial evaluation for acute left-sided weakness for 2 days. EXAM: MRI HEAD WITHOUT CONTRAST TECHNIQUE: Multiplanar, multiecho pulse sequences of the brain and surrounding structures were obtained without intravenous contrast. COMPARISON:  Prior CT from earlier the same day as well as previous MRI from 11/23/2016. FINDINGS: Brain: Cerebral volume within normal limits. No focal parenchymal signal abnormality. No abnormal foci of restricted diffusion to suggest acute or subacute ischemia. Gray-white matter differentiation well maintained. No encephalomalacia to suggest chronic  infarction. No susceptibility fact to suggest acute or chronic intracranial hemorrhage. No mass lesion, midline shift or mass effect. No hydrocephalus. No extra-axial fluid collection. Major dural sinuses are patent. Incidental note made of a partially empty sella. Midline structures intact and normal. Vascular: Major intracranial vascular flow voids are well maintained. Skull and upper cervical spine: Craniocervical junction normal. Visualized upper cervical spine unremarkable. Bone marrow signal intensity within normal limits. No scalp soft tissue abnormality. Sinuses/Orbits: Globes and orbital soft tissues within normal limits. Visualized paranasal sinuses are clear. No significant mastoid effusion. Inner ear structures normal. Other: None. IMPRESSION: Normal MRI of the brain. No acute intracranial abnormality identified. Electronically Signed   By: Jeannine Boga M.D.   On: 12/02/2016 18:21    EKG: pending  Assessment/Plan Active Problems:   Weakness of left lower extremity   Stroke?/LLE weakness CT head negative for acute bleed MRI ordered and was neg Aspirin full dose given  And daily A1c 5.1, 9/8 Lipid panel at goal, 9/8 Admitted to telemetry bed Echo EF 60-65% on 11/23/2016 Carotid Dopplers nl on 11/23/2016 MRA ordered Neuro team to follow patient, will start plavix 75mg  per their reprot  Hypertension When necessary hydralazine 10 mg IV as needed for severe blood pressure Cont lisinopril  GERD Cont PPI & carafate  Constipation Prn miralax  Hypothyroid 9/11 TSH 6.154 On exam appears euthyroid  Hx of cocaine abuse UDS neg  Code Status: FULL  DVT Prophylaxis: heparin Family Communication: pt wants son to be contacted if anything happens Disposition Plan: Pending Improvemet  Status: obs tele  Elwin Mocha, MD Family Medicine [facility] [email or web address] Password [email or web address]

## 2016-12-02 NOTE — ED Notes (Signed)
To MRI

## 2016-12-02 NOTE — ED Notes (Signed)
EKG given to Dr. Zammit 

## 2016-12-02 NOTE — ED Triage Notes (Addendum)
Reports of waking up today with left side weakness/flaccid. Patient is able to use left side minimally. Patient alert and oriented x4.  Reports of headache on right side of head and complains of nausea, has taken Zofran with no relief. Patient sent from Dr. Josephine Cables office today. Recently admitted for CVA.

## 2016-12-03 DIAGNOSIS — F1010 Alcohol abuse, uncomplicated: Secondary | ICD-10-CM | POA: Diagnosis not present

## 2016-12-03 DIAGNOSIS — R69 Illness, unspecified: Secondary | ICD-10-CM | POA: Diagnosis not present

## 2016-12-03 DIAGNOSIS — G4700 Insomnia, unspecified: Secondary | ICD-10-CM | POA: Diagnosis not present

## 2016-12-03 DIAGNOSIS — M545 Low back pain: Secondary | ICD-10-CM | POA: Diagnosis not present

## 2016-12-03 DIAGNOSIS — R471 Dysarthria and anarthria: Secondary | ICD-10-CM | POA: Diagnosis not present

## 2016-12-03 DIAGNOSIS — I10 Essential (primary) hypertension: Secondary | ICD-10-CM | POA: Diagnosis not present

## 2016-12-03 DIAGNOSIS — R269 Unspecified abnormalities of gait and mobility: Secondary | ICD-10-CM | POA: Diagnosis not present

## 2016-12-03 DIAGNOSIS — E039 Hypothyroidism, unspecified: Secondary | ICD-10-CM | POA: Diagnosis not present

## 2016-12-03 DIAGNOSIS — R29898 Other symptoms and signs involving the musculoskeletal system: Secondary | ICD-10-CM | POA: Diagnosis not present

## 2016-12-03 DIAGNOSIS — I69952 Hemiplegia and hemiparesis following unspecified cerebrovascular disease affecting left dominant side: Secondary | ICD-10-CM | POA: Diagnosis not present

## 2016-12-03 LAB — BASIC METABOLIC PANEL
ANION GAP: 7 (ref 5–15)
BUN: 8 mg/dL (ref 6–20)
CHLORIDE: 104 mmol/L (ref 101–111)
CO2: 24 mmol/L (ref 22–32)
Calcium: 8.8 mg/dL — ABNORMAL LOW (ref 8.9–10.3)
Creatinine, Ser: 0.43 mg/dL — ABNORMAL LOW (ref 0.44–1.00)
GFR calc Af Amer: >60 mL/min (ref >60)
GFR calc non Af Amer: >60 mL/min (ref >60)
GLUCOSE: 99 mg/dL (ref 65–99)
Potassium: 3.1 mmol/L — ABNORMAL LOW (ref 3.5–5.1)
Sodium: 135 mmol/L (ref 135–145)

## 2016-12-03 MED ORDER — ASPIRIN 81 MG PO CHEW
162.0000 mg | CHEWABLE_TABLET | Freq: Once | ORAL | Status: AC
  Administered 2016-12-03: 162 mg via ORAL
  Filled 2016-12-03: qty 2

## 2016-12-03 MED ORDER — PANTOPRAZOLE SODIUM 40 MG PO TBEC
40.0000 mg | DELAYED_RELEASE_TABLET | Freq: Every day | ORAL | Status: DC
  Administered 2016-12-03 – 2016-12-05 (×2): 40 mg via ORAL
  Filled 2016-12-03 (×3): qty 1

## 2016-12-03 MED ORDER — CYANOCOBALAMIN 1000 MCG/ML IJ SOLN
1000.0000 ug | Freq: Once | INTRAMUSCULAR | Status: AC
  Administered 2016-12-03: 1000 ug via INTRAMUSCULAR
  Filled 2016-12-03: qty 1

## 2016-12-03 MED ORDER — POTASSIUM CHLORIDE CRYS ER 20 MEQ PO TBCR
40.0000 meq | EXTENDED_RELEASE_TABLET | Freq: Once | ORAL | Status: AC
  Administered 2016-12-03: 40 meq via ORAL
  Filled 2016-12-03: qty 2

## 2016-12-03 MED ORDER — SODIUM CHLORIDE 0.9 % IV SOLN
INTRAVENOUS | Status: AC
  Administered 2016-12-03: 11:00:00 via INTRAVENOUS

## 2016-12-03 MED ORDER — LORAZEPAM 2 MG/ML IJ SOLN
1.0000 mg | Freq: Once | INTRAMUSCULAR | Status: AC
  Administered 2016-12-03: 1 mg via INTRAVENOUS
  Filled 2016-12-03: qty 1

## 2016-12-03 NOTE — Progress Notes (Addendum)
Patient ID: Angel Price, female   DOB: [birth year], 51 y.o.   MRN: [id number]    PROGRESS NOTE  Angel Price  WOE:[id number] DOB: March 03, 1966 DOA: 12/02/2016  PCP: Rosita Fire, MD   Brief Narrative:  Patient is 51 year old female with known adrenal mass, alcohol abuse, depression, gastroparesis, suicidal attempts in the past, presented with left upper and lower extremity weakness, left facial numbness. Patient explains that last week she had gradual onset of headache and this was associated with worsening left upper extremity weakness, she then suddenly developed left lower extremity weakness earlier in the morning prior to this admission. She saw her primary care physician who referred her to emergency department for further evaluation.  Assessment & Plan:   Principal Problem:   Acute left side hemiplegia - Unclear etiology, patient still not able to move her left upper or lower extremity - MRI of the brain negative for stroke\ - Appreciate neurologist assistance, plan to obtain a cervical spine MRI with and without contrast to rule out cervical myelopathy - Per neurology, also order MRA of the spine to evaluate possible spinal AVM - Additional labs to be ordered per neurology recommendations, RPR, homocystine, sedimentation rate, CRP, HIV, TSH - Per neurology, also order MRA of the spine to evaluate possible spinal AVM - per neurology, started Aspirin 162 mg PO QD - Patient will need intensive occupational and physical therapy - Evaluation already done in the skilled nursing facility recommended - Social work consulted for assistance with placement  Active Problems:   History of cocaine abuse - Patient denies ongoing substance abuse, UDS positive for opiates    Hypertension, essential - Reasonable inpatient control - Continue lisinopril    GERD - Continue PPI and Carafate     Hypokalemia - Supplement with K Dur and repeat BMP in the morning    Vitamin B12  deficiency - Continue to supplement  DVT prophylaxis: SCD's Code Status: Full Family Communication: Patient at bedside  Disposition Plan: to be determined, per PT eval SNF in AM   Consultants:   Neurology   Procedures:   None  Antimicrobials:   None  Subjective: Pt reports ongoing left upper and lower extremity weakness.   Objective: Vitals:   12/03/16 0045 12/03/16 0245 12/03/16 0445 12/03/16 0645  BP: 120/71 106/77 110/77 122/68  Pulse: 64 69 60 62  Resp: 18 18 18 20   Temp: 98.1 F (36.7 C) 98.3 F (36.8 C) 98.2 F (36.8 C) 98.2 F (36.8 C)  TempSrc: Oral Oral Oral Oral  SpO2: 97% 98% 98% 97%  Weight:      Height:        Intake/Output Summary (Last 24 hours) at 12/03/16 0955 Last data filed at 12/03/16 0331  Gross per 24 hour  Intake                0 ml  Output              600 ml  Net             -600 ml   Filed Weights   12/02/16 1509 12/02/16 2045  Weight: 61.2 kg (135 lb) 61.2 kg (134 lb 15.1 oz)   Examination:  General exam: Appears calm and comfortable  Respiratory system: Clear to auscultation. Respiratory effort normal. Cardiovascular system: S1 & S2 heard, RRR. No JVD, murmurs, rubs, gallops or clicks. No pedal edema. Gastrointestinal system: Abdomen is nondistended, soft and nontender. No organomegaly or masses felt. Normal bowel sounds heard.  Central nervous system: Alert and oriented. Left sided hemiparesis   Data Reviewed: I have personally reviewed following labs and imaging studies  CBC:  Recent Labs Lab 12/02/16 1554 12/02/16 1608  WBC 9.0  --   NEUTROABS 4.3  --   HGB 10.0* 10.2*  HCT 28.4* 30.0*  MCV 78.5  --   PLT 411*  --    Basic Metabolic Panel:  Recent Labs Lab 12/02/16 1554 12/02/16 1608 12/03/16 0357  NA 136 138 135  K 3.7 4.1 3.1*  CL 104 103 104  CO2 26  --  24  GLUCOSE 92 88 99  BUN 9 8 8   CREATININE 0.43* 0.40* 0.43*  CALCIUM 9.5  --  8.8*   Liver Function Tests:  Recent Labs Lab 12/02/16 1554   AST 24  ALT 22  ALKPHOS 98  BILITOT 0.3  PROT 7.1  ALBUMIN 3.3*   Coagulation Profile:  Recent Labs Lab 12/02/16 1554  INR 0.95   Urine analysis:    Component Value Date/Time   COLORURINE STRAW (A) 12/02/2016 1545   APPEARANCEUR CLEAR 12/02/2016 1545   LABSPEC 1.003 (L) 12/02/2016 1545   PHURINE 5.0 12/02/2016 1545   GLUCOSEU NEGATIVE 12/02/2016 1545   HGBUR NEGATIVE 12/02/2016 1545   BILIRUBINUR NEGATIVE 12/02/2016 1545   KETONESUR NEGATIVE 12/02/2016 1545   PROTEINUR NEGATIVE 12/02/2016 1545   UROBILINOGEN 0.2 11/26/2014 0915   NITRITE NEGATIVE 12/02/2016 1545   LEUKOCYTESUR NEGATIVE 12/02/2016 1545   Radiology Studies: Ct Head Wo Contrast Result Date: 12/02/2016 No evident intracranial mass, hemorrhage, or extra-axial fluid collection. No focal gray-white compartment lesion is demonstrated on this study. Given patient's clinical symptoms, correlation with MR with diffusion imaging may well be advisable. There are foci of arterial vascular calcification. There is ethmoid sinus mucosal thickening at several levels.  Mr Brain Wo Contrast Result Date: 12/02/2016 Normal MRI of the brain. No acute intracranial abnormality identified.   Scheduled Meds: .  stroke: mapping our early stages of recovery book   Does not apply Once  . clopidogrel  75 mg Oral Daily  . cyanocobalamin  1,000 mcg Intramuscular Once  . heparin  5,000 Units Subcutaneous Q8H  . lisinopril  20 mg Oral Daily  . pantoprazole  40 mg Oral Daily  . sucralfate  1 g Oral TID WC & HS  . vitamin B-12  5,000 mcg Oral Daily   Continuous Infusions: . sodium chloride 100 mL/hr at 12/02/16 2244     LOS: 0 days   Time spent: 25 minutes   Faye Ramsay, MD [facility] Pager [phone]  If 7PM-7AM, please contact night-coverage [email or web address] Password [email or web address] 12/03/2016, 9:55 AM

## 2016-12-03 NOTE — Evaluation (Signed)
Physical Therapy Evaluation Patient Details Name: Angel Price MRN: [id number] DOB: [birth year] Today's Date: 12/03/2016   History of Present Illness  Angel Price is a 51 y.o. female  with past medical history significant for adrenal mass, etoh abuse, depression, gastroparesis, thyroid disease and suicide attempt since the emergency room chief complaint of left lower extremity weakness. Patient states that over the last week she's had a gradual onset of a headache. Severe headache. In addition she's had worsening of her left upper extremity weakness. His sudden onset of left lower extremity weakness earlier today. Patient states she has a family history of strokes. Mother had a stroke. Maternal grandmother had stroke. Her brother has had a stroke.    Clinical Impression  Patient demonstrates limited use of LUE/LE due to weakness, can lightly grip with left hand when using RW, limited mostly due to c/o left ankle pain and tolerated sitting up in chair after therapy.  Patient will benefit from continued physical therapy in hospital and recommended venue below to increase strength, balance, endurance for safe ADLs and gait.    Follow Up Recommendations SNF;Supervision/Assistance - 24 hour    Equipment Recommendations  None recommended by PT    Recommendations for Other Services       Precautions / Restrictions Precautions Precautions: Fall Restrictions Weight Bearing Restrictions: No      Mobility  Bed Mobility Overal bed mobility: Needs Assistance Bed Mobility: Supine to Sit;Sit to Supine     Supine to sit: Min guard Sit to supine: Min guard   General bed mobility comments: Patient uses RLE to move LLE during bed mobility, did not use LUE  Transfers Overall transfer level: Needs assistance Equipment used: Rolling walker (2 wheeled) Transfers: Sit to/from [facility] Sit to Stand: Min assist Stand pivot transfers: Min assist       General transfer  comment: Patient able to lightly grip walker with left hand during sit to stands, transfers  Ambulation/Gait Ambulation/Gait assistance: Min assist Ambulation Distance (Feet): 3 Feet Assistive device: Rolling walker (2 wheeled) Gait Pattern/deviations: Decreased step length - right;Decreased step length - left;Decreased stance time - left;Decreased stride length   Gait velocity interpretation: Below normal speed for age/gender General Gait Details: limited to a few steps for transfering due to left ankle pain  Stairs            Wheelchair Mobility    Modified Rankin (Stroke Patients Only)       Balance Overall balance assessment: Needs assistance Sitting-balance support: Feet supported;No upper extremity supported Sitting balance-Leahy Scale: Good     Standing balance support: Bilateral upper extremity supported;During functional activity Standing balance-Leahy Scale: Fair                               Pertinent Vitals/Pain Pain Assessment: 0-10 Pain Score: 8  Pain Location: posterior neck down spine, left ankle pain Pain Descriptors / Indicators: Pressure Pain Intervention(s): Limited activity within patient's tolerance;Monitored during session    Home Living Family/patient expects to be discharged to:: Private residence Living Arrangements: Parent;Other relatives Available Help at Discharge: Family Type of Home: House Home Access: Level entry;Stairs to enter   Entrance Stairs-Number of Steps: 3 without handrails  Home Layout: One level Home Equipment: Walker - 2 wheels      Prior Function Level of Independence: Independent               Hand Dominance  Extremity/Trunk Assessment   Upper Extremity Assessment Upper Extremity Assessment: Defer to OT evaluation LUE Deficits / Details: see OT notes    Lower Extremity Assessment Lower Extremity Assessment: RLE deficits/detail;LLE deficits/detail RLE Deficits / Details: grossly  4+/5 LLE Deficits / Details: grossly -3/5 LLE Sensation: decreased light touch    Cervical / Trunk Assessment Cervical / Trunk Assessment: Normal  Communication   Communication: No difficulties  Cognition Arousal/Alertness: Awake/alert Behavior During Therapy: WFL for tasks assessed/performed Overall Cognitive Status: Within Functional Limits for tasks assessed                                        General Comments      Exercises     Assessment/Plan    PT Assessment Patient needs continued PT services  PT Problem List Decreased strength;Decreased activity tolerance;Decreased balance;Decreased mobility;Pain       PT Treatment Interventions Gait training;Stair training;Functional mobility training;Therapeutic activities;Therapeutic exercise;Patient/family education    PT Goals (Current goals can be found in the Care Plan section)  Acute Rehab PT Goals Patient Stated Goal: Return home after rehab PT Goal Formulation: With patient Time For Goal Achievement: 12/10/16 Potential to Achieve Goals: Good    Frequency 7X/week   Barriers to discharge        Co-evaluation               AM-PAC PT "6 Clicks" Daily Activity  Outcome Measure Difficulty turning over in bed (including adjusting bedclothes, sheets and blankets)?: Unable Difficulty moving from lying on back to sitting on the side of the bed? : Unable Difficulty sitting down on and standing up from a chair with arms (e.g., wheelchair, bedside commode, etc,.)?: Unable Help needed moving to and from a bed to chair (including a wheelchair)?: A Little Help needed walking in hospital room?: A Little Help needed climbing 3-5 steps with a railing? : A Lot 6 Click Score: 11    End of Session Equipment Utilized During Treatment: Gait belt Activity Tolerance: Patient limited by pain;Patient limited by fatigue Patient left: in chair;with call bell/phone within reach Nurse Communication: Mobility  status PT Visit Diagnosis: Unsteadiness on feet (R26.81);Other abnormalities of gait and mobility (R26.89);Muscle weakness (generalized) (M62.81)    Time: 9390-3009 PT Time Calculation (min) (ACUTE ONLY): 16 min   Charges:   PT Evaluation $PT Eval Low Complexity: 1 Low PT Treatments $Therapeutic Activity: 8-22 mins   PT G Codes:   PT G-Codes **NOT FOR INPATIENT CLASS** Functional Assessment Tool Used: AM-PAC 6 Clicks Basic Mobility Functional Limitation: Mobility: Walking and moving around Mobility: Walking and Moving Around Current Status (Q3300): At least 60 percent but less than 80 percent impaired, limited or restricted Mobility: Walking and Moving Around Goal Status [phone]): At least 60 percent but less than 80 percent impaired, limited or restricted Mobility: Walking and Moving Around Discharge Status [phone]): At least 60 percent but less than 80 percent impaired, limited or restricted    1:26 PM, 12/03/16 Lonell Grandchild, MPT Physical Therapist with [facility] [phone] office [phone] mobile phone

## 2016-12-03 NOTE — Consult Note (Signed)
[facility] A. Angel Laughter, MD     [email or web address]          Angel Price is an 51 y.o. female.   ASSESSMENT/PLAN: 1. Acute left hemiplegia of unclear etiology:  Repeat MRI of the brain has been negative for acute stroke or other processes.  The initial concern on her previous evaluation was that this was an MRI negative stroke especially given the dysarthria but given the profound weakness am concerned about other processes. Cervical myelopathy is a concern.  Consequently, we will do a cervical spine MRI with and without contrast.  I will also order an MRA of the spine to evaluate for possible spinal AVM.  Patient will need intense physical therapy and occupational therapy.     The patient is a 51 year old left-handed black female who was seen last week for the acute onset of left upper extremity the weakness along with dysarthria.  She was worked up as outlined below and the workup was essentially unrevealing.  We thought she may have had a negative imaging stroke.  She was placed on aspirin and has been compliant with this.  She reports that she developed the acute onset of profound weakness involving the entire left side.  Mostly affected however is the left upper and lower extremity which is profoundly weak essentially plegic.  She reports having some mild dysarthria which she has had previous stay from her other event.  She has had some neck pain and light dizziness.  She denies headaches, chest pain or shortness of breath.  Bladder or bowels symptoms not reported.  The review of systems otherwise negative.     GENERAL:   She is in some discomfort but no acute distress.  HEENT:   Mild tenderness of the neck but supple.  ABDOMEN: Soft  EXTREMITIES: No edema   BACK: Normal alignment.  SKIN: Normal by inspection.    MENTAL STATUS: Alert and oriented - including orientation to month and age;  Speech is mildly dysarthric; language and cognition are generally  intact. Judgment and insight normal.   CRANIAL NERVES: Pupils are equal, round and reactive to light and accommodation; extraocular movements are full, there is no significant nystagmus;   There is mild flattening of the nasolabial fold on the left but otherwise good strength throughout the facial muscles, tongue is midline; uvula is midline; shoulder elevation is normal.  MOTOR:  The left upper and left lower extremities are plegic 0/5.  The right side shows normal tone, bulk and strength.  No drift of the right upper lower extremity.  COORDINATION: Left finger to nose is normal, right finger to nose is normal, No rest tremor; no intention tremor; no postural tremor; no bradykinesia.  SENSATION:   She seems to respond to pain equally on both sides.     NIH stroke scale 5.      PRIOR NEURO NOTE 1. I suspect that the patient has a small lacunar infarct that is not seen on MRI.  This is known as MRI negative infarct.   There is likely some psychosomatic symptoms /embellishment of symptoms however.  Risk factors are age,   Hypertension, history of alcohol use, history of nicotine use and also history of cocaine use.  I will obtain a head CTA.  I would increase aspirin to 160 mg a day.  Physical and occupational therapies are recommended. Additional labs will be obtained including RPR, homocystine, sedimentation rate, C-reactive protein, HIV and TSH.  2. Vitamin B12 deficiency: This will  be replaced today and I would also suggest daily while she is hospitalized. Afterwards monthly should be fine.  3.  Cervical disc disease multilevel but on likely to explain the patient's symptoms.      The patient is a 51 year old left-handed black female who presents with the acute onset of left upper extremity weakness and numbness associated with the significant dysarthria.  The patient reports her symptoms have not gotten any better and in fact may have gotten worse.  She denies any associated  dizziness or swallowing problems.  She has had some chest pain although this may be chronic/subacute.  Patient has a past history of cocaine use.  She tells me she has not used in about 5 years.  She denies any recent medications.  She has been able to ambulate but this has been difficult due to the left-sided weakness.  She has had multiple abdominal procedures as outlined below.  She did present with some abdominal pain.       Blood pressure 122/68, pulse 62, temperature 98.2 F (36.8 C), temperature source Oral, resp. rate 20, height _0  (1.6 m), weight 134 lb 15.1 oz (61.2 kg), SpO2 97 %.  Past Medical History:  Diagnosis Date  . Abscess    soft tissue  . Adrenal mass ([place])   . Alcohol abuse   . Anxiety   . Blood transfusion without reported diagnosis   . Chronic abdominal pain   . Chronic wound infection of abdomen   . Colon polyp    colonoscopy 04/2014  . Depression   . Diverticulosis    colonoscopy 04/2014 moderat pan colonic  . Gastritis    EGD 05/2014  . Gastroparesis Nov 2015  . GERD (gastroesophageal reflux disease)   . Hemorrhoid    internal large  . Hiatal hernia   . History of Billroth II operation   . Hypertension   . Lung nodule    CT 02/2014 needs repeat 1 month  . Lung nodule < 6cm on CT 04/25/2014  . Lupus   . Nausea and vomiting    chronic, recurrent  . Schatzki's ring    patent per EGD 04/2014  . Sickle cell trait ([place])   . Suicide attempt ([place])   . 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 ABDOMINAL HYSTERECTOMY  2013   [place]  . ABDOMINAL SURGERY    . ADRENALECTOMY Right   . AGILE CAPSULE N/A 01/05/2015   Procedure: AGILE CAPSULE;  Surgeon: Daneil Dolin, MD;  [facility];  Service: Endoscopy;  Laterality: N/A;  0700  . Billroth II procedure      [place], first 2000, 2005/2006.  Marland Kitchen BIOPSY  05/20/2013   Procedure: BIOPSIES OF ASCENDING AND SIGMOID COLON;  Surgeon:  Daneil Dolin, MD;  Location: [facility];  Service: Endoscopy;;  . BIOPSY  04/26/2014   Procedure: BIOPSIES;  Surgeon: Danie Binder, MD;  Location: [facility];  Service: Endoscopy;;  . CHOLECYSTECTOMY    . COLONOSCOPY     in [place]  . COLONOSCOPY WITH PROPOFOL N/A 05/20/2013   Dr.Rourk- inadequate prep, normal appearing rectum, grossly normal colon aside from pancolonic diverticula, normal terminal ileum bx= unremarkable colonic mucosa. Due for early interval 2016.   Marland Kitchen COLONOSCOPY WITH PROPOFOL N/A 04/26/2014   SEG:BTDVVO ileum/one colon polyp removed/moderate pan-colonic diverticulosis/large internal hemorrhoids  . COLONOSCOPY WITH PROPOFOL N/A 12/23/2014   Dr.Rourk- minimal internal hemorrhoids, pancolonic diverticulosis  .  COLONOSCOPY WITH PROPOFOL N/A 08/23/2016   Procedure: COLONOSCOPY WITH PROPOFOL;  Surgeon: Danie Binder, MD;  [facility];  Service: Endoscopy;  Laterality: N/A;  . DEBRIDEMENT OF ABDOMINAL WALL ABSCESS N/A 02/08/2013   Procedure: DEBRIDEMENT OF ABDOMINAL WALL ABSCESS;  Surgeon: Jamesetta So, MD;  Location: [facility];  Service: General;  Laterality: N/A;  . ESOPHAGOGASTRODUODENOSCOPY (EGD) WITH PROPOFOL N/A 05/20/2013   Dr.Rourk- s/p prior gastric surgery with normal esophagus, residual gastric mucosa and patent efferent limb  . ESOPHAGOGASTRODUODENOSCOPY (EGD) WITH PROPOFOL N/A 02/03/2014   Dr. Gala Romney:  s/p hemigastrectomy with retained gastric contents. Residual gastric mucosa and efferent limb appeared normal otherwise. Query gastroparesis.   Marland Kitchen ESOPHAGOGASTRODUODENOSCOPY (EGD) WITH PROPOFOL N/A 04/26/2014   HER:[id number] ring/small HH/mild non-erosive gasrtitis/normal anastomosis  . ESOPHAGOGASTRODUODENOSCOPY (EGD) WITH PROPOFOL N/A 12/23/2014   Dr.Rourk- s/p prior hemigastrctomy, active oozing from anastomotic suture site, hemostasis achieved  . ESOPHAGOGASTRODUODENOSCOPY (EGD) WITH PROPOFOL N/A 05/23/2016   Dr. 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 tendon repar Right    wrist  . WOUND EXPLORATION Right 06/24/2013   Procedure: exploration of traumatic wound right wrist;  Surgeon: Tennis Must, MD;  Location: [facility];  Service: Orthopedics;  Laterality: Right;    Family History  Problem Relation Age of Onset  . Brain cancer Son   . Schizophrenia Son   . Cancer Son        brain  . Lung cancer Father   . Cancer Father        mets  . Drug abuse Mother   . Breast cancer Maternal Aunt   . Bipolar disorder Maternal Aunt   . Drug abuse Maternal Aunt   . Colon cancer Maternal Grandmother        late 50s, early 56s  . Drug abuse Sister   . Drug abuse Brother   . Bipolar disorder Paternal Grandfather   . Bipolar disorder Cousin   . Liver disease Neg Hx     Social History:  reports that she has been smoking Cigarettes.  She has a 17.50 pack-year smoking history. She has never used smokeless tobacco. She reports that she does not drink alcohol or use drugs.  Allergies:  Allergies  Allergen Reactions  . Codeine Hives  . Morphine And Related Hives  . Reglan [Metoclopramide] Other (See Comments)    EPS symptoms     Medications: Prior to Admission medications   Medication Sig Start Date End Date Taking? Authorizing Provider  Cyanocobalamin (VITAMIN B-12) 2500 MCG TABS Take 5,000 mcg by mouth daily. 11/26/16  Yes Reyne Dumas, MD  lisinopril (PRINIVIL,ZESTRIL) 20 MG tablet Take 1 tablet (20 mg total) by mouth daily. 11/26/16 11/26/17 Yes Reyne Dumas, MD  pantoprazole (PROTONIX) 20 MG tablet Take 1 tablet (20 mg total) by mouth daily. 09/23/16  Yes Francine Graven, DO  polyethylene glycol (MIRALAX / GLYCOLAX) packet Take 17 g by mouth daily as needed for moderate constipation or severe constipation.   Yes [provider]  sucralfate (CARAFATE) 1 g tablet Take 1 tablet (1 g total) by mouth 4 (four) times daily -  with meals and at bedtime. 09/17/16  Yes  Milton Ferguson, MD  aspirin EC 81 MG EC tablet Take 2 tablets (162 mg total) by mouth daily. Patient not taking: Reported on 12/02/2016 11/27/16   Reyne Dumas, MD  hydrocortisone (ANUSOL-HC) 2.5 % rectal cream Apply rectally 2  times daily Patient not taking: Reported on 12/02/2016 10/21/16   Isla Pence, MD  magnesium oxide (MAG-OX) 400 (241.3 Mg) MG tablet Take 1 tablet (400 mg total) by mouth daily. Patient not taking: Reported on 12/02/2016 11/26/16   Reyne Dumas, MD  nicotine (NICODERM CQ - DOSED IN MG/24 HOURS) 21 mg/24hr patch Place 1 patch (21 mg total) onto the skin daily. Patient not taking: Reported on 12/02/2016 11/27/16   Reyne Dumas, MD  potassium chloride SA (K-DUR,KLOR-CON) 20 MEQ tablet Take 1 tablet (20 mEq total) by mouth daily. Patient not taking: Reported on 12/02/2016 10/21/16   Isla Pence, MD    Scheduled Meds: .  stroke: mapping our early stages of recovery book   Does not apply Once  . clopidogrel  75 mg Oral Daily  . cyanocobalamin  1,000 mcg Intramuscular Once  . heparin  5,000 Units Subcutaneous Q8H  . lisinopril  20 mg Oral Daily  . pantoprazole  40 mg Oral Daily  . sucralfate  1 g Oral TID WC & HS  . vitamin B-12  5,000 mcg Oral Daily   Continuous Infusions: . sodium chloride 100 mL/hr at 12/02/16 2244   PRN Meds:.acetaminophen **OR** acetaminophen (TYLENOL) oral liquid 160 mg/5 mL **OR** acetaminophen, diphenhydrAMINE, hydrALAZINE, oxyCODONE-acetaminophen, polyethylene glycol, senna-docusate     Results for orders placed or performed during the hospital encounter of 12/02/16 (from the past 48 hour(s))  Urine rapid drug screen (hosp performed)     Status: None   Collection Time: 12/02/16  3:45 PM  Result Value Ref Range   Opiates NONE DETECTED NONE DETECTED   Cocaine NONE DETECTED NONE DETECTED   Benzodiazepines NONE DETECTED NONE DETECTED   Amphetamines NONE DETECTED NONE DETECTED   Tetrahydrocannabinol NONE DETECTED NONE DETECTED    Barbiturates NONE DETECTED NONE DETECTED    Comment:        DRUG SCREEN FOR MEDICAL PURPOSES ONLY.  IF CONFIRMATION IS NEEDED FOR ANY PURPOSE, NOTIFY LAB WITHIN 5 DAYS.        LOWEST DETECTABLE LIMITS FOR URINE DRUG SCREEN Drug Class       Cutoff (ng/mL) Amphetamine      1000 Barbiturate      200 Benzodiazepine   115 Tricyclics       726 Opiates          300 Cocaine          300 THC              50   Urinalysis, Routine w reflex microscopic     Status: Abnormal   Collection Time: 12/02/16  3:45 PM  Result Value Ref Range   Color, Urine STRAW (A) YELLOW   APPearance CLEAR CLEAR   Specific Gravity, Urine 1.003 (L) 1.005 - 1.030   pH 5.0 5.0 - 8.0   Glucose, UA NEGATIVE NEGATIVE mg/dL   Hgb urine dipstick NEGATIVE NEGATIVE   Bilirubin Urine NEGATIVE NEGATIVE   Ketones, ur NEGATIVE NEGATIVE mg/dL   Protein, ur NEGATIVE NEGATIVE mg/dL   Nitrite NEGATIVE NEGATIVE   Leukocytes, UA NEGATIVE NEGATIVE  Ethanol     Status: None   Collection Time: 12/02/16  3:54 PM  Result Value Ref Range   Alcohol, Ethyl (B) <5 <5 mg/dL    Comment:        LOWEST DETECTABLE LIMIT FOR SERUM ALCOHOL IS 5 mg/dL FOR MEDICAL PURPOSES ONLY   Protime-INR     Status: None   Collection Time: 12/02/16  3:54 PM  Result Value  Ref Range   Prothrombin Time 12.6 11.4 - 15.2 seconds   INR 0.95   APTT     Status: None   Collection Time: 12/02/16  3:54 PM  Result Value Ref Range   aPTT 31 24 - 36 seconds  CBC     Status: Abnormal   Collection Time: 12/02/16  3:54 PM  Result Value Ref Range   WBC 9.0 4.0 - 10.5 K/uL   RBC 3.62 (L) 3.87 - 5.11 MIL/uL   Hemoglobin 10.0 (L) 12.0 - 15.0 g/dL   HCT 28.4 (L) 36.0 - 46.0 %   MCV 78.5 78.0 - 100.0 fL   MCH 27.6 26.0 - 34.0 pg   MCHC 35.2 30.0 - 36.0 g/dL   RDW 15.4 11.5 - 15.5 %   Platelets 411 (H) 150 - 400 K/uL  Differential     Status: None   Collection Time: 12/02/16  3:54 PM  Result Value Ref Range   Neutrophils Relative % 48 %   Lymphocytes Relative  39 %   Monocytes Relative 10 %   Eosinophils Relative 3 %   Basophils Relative 0 %   Neutro Abs 4.3 1.7 - 7.7 K/uL   Lymphs Abs 3.5 0.7 - 4.0 K/uL   Monocytes Absolute 0.9 0.1 - 1.0 K/uL   Eosinophils Absolute 0.3 0.0 - 0.7 K/uL   Basophils Absolute 0.0 0.0 - 0.1 K/uL   WBC Morphology ATYPICAL LYMPHOCYTES   Comprehensive metabolic panel     Status: Abnormal   Collection Time: 12/02/16  3:54 PM  Result Value Ref Range   Sodium 136 135 - 145 mmol/L   Potassium 3.7 3.5 - 5.1 mmol/L   Chloride 104 101 - 111 mmol/L   CO2 26 22 - 32 mmol/L   Glucose, Bld 92 65 - 99 mg/dL   BUN 9 6 - 20 mg/dL   Creatinine, Ser 0.43 (L) 0.44 - 1.00 mg/dL   Calcium 9.5 8.9 - 10.3 mg/dL   Total Protein 7.1 6.5 - 8.1 g/dL   Albumin 3.3 (L) 3.5 - 5.0 g/dL   AST 24 15 - 41 U/L   ALT 22 14 - 54 U/L   Alkaline Phosphatase 98 38 - 126 U/L   Total Bilirubin 0.3 0.3 - 1.2 mg/dL   GFR calc non Af Amer >60 >60 mL/min   GFR calc Af Amer >60 >60 mL/min    Comment: (NOTE) The eGFR has been calculated using the CKD EPI equation. This calculation has not been validated in all clinical situations. eGFR's persistently <60 mL/min signify possible Chronic Kidney Disease.    Anion gap 6 5 - 15  I-Stat beta hCG blood, ED     Status: None   Collection Time: 12/02/16  4:06 PM  Result Value Ref Range   I-stat hCG, quantitative <5.0 <5 mIU/mL   Comment 3            Comment:   GEST. AGE      CONC.  (mIU/mL)   <=1 WEEK        5 - 50     2 WEEKS       50 - 500     3 WEEKS       100 - 10,000     4 WEEKS     1,000 - 30,000        FEMALE AND NON-PREGNANT FEMALE:     LESS THAN 5 mIU/mL   I-Stat Chem 8, ED     Status: Abnormal  Collection Time: 12/02/16  4:08 PM  Result Value Ref Range   Sodium 138 135 - 145 mmol/L   Potassium 4.1 3.5 - 5.1 mmol/L   Chloride 103 101 - 111 mmol/L   BUN 8 6 - 20 mg/dL   Creatinine, Ser 0.40 (L) 0.44 - 1.00 mg/dL   Glucose, Bld 88 65 - 99 mg/dL   Calcium, Ion 1.23 1.15 - 1.40 mmol/L    TCO2 27 22 - 32 mmol/L   Hemoglobin 10.2 (L) 12.0 - 15.0 g/dL   HCT 30.0 (L) 36.0 - 46.0 %  I-stat troponin, ED     Status: None   Collection Time: 12/02/16  4:22 PM  Result Value Ref Range   Troponin i, poc 0.00 0.00 - 0.08 ng/mL   Comment 3            Comment: Due to the release kinetics of cTnI, a negative result within the first hours of the onset of symptoms does not rule out myocardial infarction with certainty. If myocardial infarction is still suspected, repeat the test at appropriate intervals.   Basic metabolic panel     Status: Abnormal   Collection Time: 12/03/16  3:57 AM  Result Value Ref Range   Sodium 135 135 - 145 mmol/L   Potassium 3.1 (L) 3.5 - 5.1 mmol/L    Comment: DELTA CHECK NOTED   Chloride 104 101 - 111 mmol/L   CO2 24 22 - 32 mmol/L   Glucose, Bld 99 65 - 99 mg/dL   BUN 8 6 - 20 mg/dL   Creatinine, Ser 0.43 (L) 0.44 - 1.00 mg/dL   Calcium 8.8 (L) 8.9 - 10.3 mg/dL   GFR calc non Af Amer >60 >60 mL/min   GFR calc Af Amer >60 >60 mL/min    Comment: (NOTE) The eGFR has been calculated using the CKD EPI equation. This calculation has not been validated in all clinical situations. eGFR's persistently <60 mL/min signify possible Chronic Kidney Disease.    Anion gap 7 5 - 15    Studies/Results:  The brain MRI is reviewed in person. No increased signal is observed on DWI. SWI shows no hemorrhage. No white matter lesions are seen on FLAIR imaging. No encephalomalacia seen on T1. This scan is essentially unrevealing.   Bennett Ram A. Angel Price, M.D.  Diplomate, Tax adviser of Psychiatry and Neurology ( Neurology). 12/03/2016, 8:42 AM

## 2016-12-03 NOTE — Care Management Obs Status (Signed)
MEDICARE OBSERVATION STATUS NOTIFICATION   Patient Details  Name: Angel Price MRN: [id number] Date of Birth: [birth year]   Medicare Observation Status Notification Given:  Yes    Sherald Barge, RN 12/03/2016, 11:21 AM

## 2016-12-03 NOTE — Care Management Note (Signed)
Case Management Note  Patient Details  Name: Angel Price MRN: [id number] Date of Birth: [birth year]  Subjective/Objective:                  Admitted with weakness. Pt is from home, DC'd last week. Was recommended for SNF then, faxed out day of DC and no bed offered that day. Pt DC 'd home wth [facility] referral, she states HH never called. CM verified with [facility] rep, referral was not properly submitted by them. PT is again recommending SNF, Neurology recommending intensive PT/OT. CSW aware.   Action/Plan: CM will cont to follow,   Expected Discharge Date:  11/26/16               Expected Discharge Plan:  [place]  In-House Referral:  Clinical Social Work  Discharge planning Services  CM Consult  Status of Service:  In process, will continue to follow  Sherald Barge, RN 12/03/2016, 11:24 AM

## 2016-12-03 NOTE — Progress Notes (Signed)
Dr. Aggie Moats paged d/t pt complaining of her ankle hurting 9/10 and her pain pill Oxycodone not working. Dr.Hobbs stated he is not ordering her anything else for pain and to apply ice to affected area. Pt adv that any other kind of pain medication can mask her symptoms and that IV pain medication will most likely not be ordered for her this shift. Pt stated she still wanted me to ask. Will relay Dr. Valentina Gu to pt and let her know to speak with her day physician.

## 2016-12-03 NOTE — Evaluation (Signed)
Occupational Therapy Evaluation Patient Details Name: Angel Price MRN: [id number] DOB: 1965/09/17 Today's Date: 12/03/2016    History of Present Illness Angel Price is a 51 y.o. female  with past medical history significant for adrenal mass, etoh abuse, depression, gastroparesis, thyroid disease and suicide attempt since the emergency room chief complaint of left lower extremity weakness. Patient states that over the last week she's had a gradual onset of a headache. Severe headache. In addition she's had worsening of her left upper extremity weakness. His sudden onset of left lower extremity weakness earlier today. Patient states she has a family history of strokes. MRI and CT negative for acute CVA   Clinical Impression   Pt received semi-reclined in bed, agreeable to OT evaluation. OT familiar with pt from recent admission. After most recent admission pt reports she improved rapidly and was independent at home with all B/IADL and mobility tasks. Also reporting [place] services did not come out. This am pt reporting inability to use her LUE and LLE. During evaluation, pt subjectively unable to use the LUE, however with mobility tasks pt demonstrates good adduction and extension, as well as grip strength with walker use. During transfer task, pt with fair LLE weight-bearing, OT blocking LLE to prevent buckling, however once mobility task began able to remove knee block and pt performing with min assist. If pt continues to present in current fashion, recommend SNF on discharge to improve independence and safety with ADL performance and improve LUE/LLE strength.      Follow Up Recommendations  SNF;Supervision/Assistance - 24 hour    Equipment Recommendations  None recommended by OT       Precautions / Restrictions Precautions Precautions: Fall Restrictions Weight Bearing Restrictions: No      Mobility Bed Mobility Overal bed mobility: Needs Assistance Bed Mobility: Supine to Sit      Supine to sit: Supervision;HOB elevated     General bed mobility comments: Pt hooks RLE under LLE to bring LLE to EOB. Pt placed LUE across stomach and used RUE for pushing up into sitting  Transfers Overall transfer level: Needs assistance Equipment used: Rolling walker (2 wheeled) Transfers: Sit to/from [facility] Sit to Stand: Min assist Stand pivot transfers: Min assist       General transfer comment: Left knee blocked during transfer tasks to prevent buckling. Pt able to use LUE to grasp walker and weightbear. Dragging LLE during mobility         ADL either performed or assessed with clinical judgement   ADL Overall ADL's : Needs assistance/impaired Eating/Feeding: Set up;Sitting Eating/Feeding Details (indicate cue type and reason): Pt using right hand to prepare meal, including opening packets  Grooming: Wash/dry hands;Set up;Sitting Grooming Details (indicate cue type and reason): Pt using right hand to wash face         Upper Body Dressing : Moderate assistance;Sitting Upper Body Dressing Details (indicate cue type and reason): Assist for left arm, managing buttons/ties Lower Body Dressing: Maximal assistance;Sitting/lateral leans;Bed level Lower Body Dressing Details (indicate cue type and reason): Pt unable to donn socks due to LUE weakness                     Vision Baseline Vision/History: Wears glasses Wears Glasses: At all times Patient Visual Report: No change from baseline Vision Assessment?: No apparent visual deficits Eye Alignment: Within Functional Limits Ocular Range of Motion: Within Functional Limits Alignment/Gaze Preference: Within Defined Limits Tracking/Visual Pursuits: Able to track stimulus in  all quads without difficulty Saccades: Within functional limits Convergence: Within functional limits Visual Fields: No apparent deficits            Pertinent Vitals/Pain Pain Assessment: 0-10 (only when OT touched  ankle, no complaints with mobility) Pain Score: 5  Pain Location: left ankle Pain Descriptors / Indicators: Sharp;Shooting Pain Intervention(s): Limited activity within patient's tolerance;Monitored during session;Repositioned     Hand Dominance Left   Extremity/Trunk Assessment Upper Extremity Assessment Upper Extremity Assessment: LUE deficits/detail LUE Deficits / Details: subjectively: able to shrug shoulder, 0/5 in shoulder/elbow/wrist, hand with minimal grip strength: Objective observations: able to grip walker and bear weight on LUE, pushed arm through hole of gown, bearing weight on LUE hand and forearm.  LUE Coordination: decreased fine motor;decreased gross motor   Lower Extremity Assessment Lower Extremity Assessment: Defer to PT evaluation   Cervical / Trunk Assessment Cervical / Trunk Assessment: Normal   Communication Communication Communication: No difficulties   Cognition Arousal/Alertness: Awake/alert Behavior During Therapy: WFL for tasks assessed/performed Overall Cognitive Status: Within Functional Limits for tasks assessed                                                Home Living Family/patient expects to be discharged to:: Private residence Living Arrangements: Parent;Other relatives Available Help at Discharge: Family Type of Home: House Home Access: Level entry     Home Layout: One level     Bathroom Shower/Tub: Teacher, early years/pre: Standard     Home Equipment: None          Prior Functioning/Environment Level of Independence: Independent        Comments: Pt reports after previous admission she has been independent in B/IADLs including cooking for family, ambulating without DME        OT Problem List: Decreased strength;Decreased activity tolerance;Impaired balance (sitting and/or standing);Decreased coordination;Impaired UE functional use      OT Treatment/Interventions: Self-care/ADL  training;Therapeutic exercise;Neuromuscular education;DME and/or AE instruction;Manual therapy;Therapeutic activities;Patient/family education    OT Goals(Current goals can be found in the care plan section) Acute Rehab OT Goals Patient Stated Goal: To regain strength and be independent OT Goal Formulation: With patient Time For Goal Achievement: 12/17/16 Potential to Achieve Goals: Good  OT Frequency: Min 2X/week    AM-PAC PT "6 Clicks" Daily Activity     Outcome Measure Help from another person eating meals?: None Help from another person taking care of personal grooming?: A Little Help from another person toileting, which includes using toliet, bedpan, or urinal?: A Little Help from another person bathing (including washing, rinsing, drying)?: A Lot Help from another person to put on and taking off regular upper body clothing?: A Little Help from another person to put on and taking off regular lower body clothing?: A Lot 6 Click Score: 17   End of Session Equipment Utilized During Treatment: Gait belt;Rolling walker Nurse Communication: Mobility status  Activity Tolerance: Patient tolerated treatment well Patient left: in chair;with call bell/phone within reach  OT Visit Diagnosis: Muscle weakness (generalized) (M62.81);Hemiplegia and hemiparesis Hemiplegia - Right/Left: Left Hemiplegia - dominant/non-dominant: Dominant Hemiplegia - caused by: Unspecified                Time: 8182-9937 OT Time Calculation (min): 36 min Charges:  OT General Charges $OT Visit: 1 Visit OT Evaluation $OT Eval Low Complexity:  1 Low G-Codes: OT G-codes **NOT FOR INPATIENT CLASS** Functional Assessment Tool Used: AM-PAC 6 Clicks Daily Activity Functional Limitation: Self care Self Care Current Status (U5146): At least 40 percent but less than 60 percent impaired, limited or restricted Self Care Goal Status (I4799): At least 20 percent but less than 40 percent impaired, limited or restricted     [name], OTR/L  [phone] 12/03/2016, 8:23 AM

## 2016-12-03 NOTE — Clinical Social Work Note (Signed)
Patient was discharged on 11/26/16. Please see assessment from 11/26/16 as there have been no changes in patient's status. LCSW faxed out to facilities previously interested.    Clinical Social Work Assessment  Patient Details  Name: Angel Price MRN: [id number] Date of Birth: 17-Jan-1966  Date of referral:  11/25/16               Reason for consult:  Discharge Planning                           Permission sought to share information with:    Permission granted to share information::                Name::                   Agency::                Relationship::                Contact Information:     Housing/Transportation Living arrangements for the past 2 months:  Single Family Home Source of Information:  Patient Patient Interpreter Needed:  None Criminal Activity/Legal Involvement Pertinent to Current Situation/Hospitalization:  No - Comment as needed Significant Relationships:  Siblings, Parents Lives with:  Parents, Siblings Do you feel safe going back to the place where you live?  Yes Need for family participation in patient care:  Yes (Comment)  Care giving concerns:  None identified at baseline.        Social Worker assessment / plan:  Patient lives in the home with her parents and sister. At baseline, she ambulates with a walker and is independent in her ADLs.  Patient is agreeable to SNF or CIR. She advised that she desires to go to [facility] or [facility].  Patient has not used cocaine since 09/30/11 and alcohol since 09/19/16.   Employment status:  Disabled (Comment on whether or not currently receiving Disability) Insurance information:  Managed Medicare PT Recommendations:  [place] / Referral to community resources:  [place]  Patient/Family's Response to care: Patient is agreeable to SNF or CIR.   Patient/Family's Understanding of and Emotional Response to Diagnosis, Current Treatment, and Prognosis:   Patient understands her diagnosis, treatment and prognosis.   Emotional Assessment Appearance:  Appears stated age Attitude/Demeanor/Rapport:    Affect (typically observed):  Accepting, Calm Orientation:  Oriented to Self, Oriented to Place, Oriented to  Time, Oriented to Situation Alcohol / Substance use:   (Patient reports that she has not used Cocaine since September 30, 2011, She reports that she has not used alohol since September 19, 2016. ) Psych involvement (Current and /or in the community):  No (Comment)  Discharge Needs  Concerns to be addressed:  Discharge Planning Concerns Readmission within the last 30 days:  No Current discharge risk:  None Barriers to Discharge:  No Barriers Identified   Ihor Gully, LCSW 11/26/2016, 10:58 AM

## 2016-12-04 ENCOUNTER — Ambulatory Visit (HOSPITAL_COMMUNITY): Payer: Self-pay

## 2016-12-04 ENCOUNTER — Observation Stay (HOSPITAL_COMMUNITY): Payer: MEDICARE

## 2016-12-04 ENCOUNTER — Ambulatory Visit (HOSPITAL_COMMUNITY): Admit: 2016-12-04 | Payer: MEDICARE

## 2016-12-04 DIAGNOSIS — Z765 Malingerer [conscious simulation]: Secondary | ICD-10-CM | POA: Diagnosis not present

## 2016-12-04 DIAGNOSIS — M4802 Spinal stenosis, cervical region: Secondary | ICD-10-CM | POA: Diagnosis not present

## 2016-12-04 DIAGNOSIS — G8194 Hemiplegia, unspecified affecting left nondominant side: Secondary | ICD-10-CM | POA: Diagnosis not present

## 2016-12-04 DIAGNOSIS — R29898 Other symptoms and signs involving the musculoskeletal system: Secondary | ICD-10-CM | POA: Diagnosis not present

## 2016-12-04 DIAGNOSIS — Z87898 Personal history of other specified conditions: Secondary | ICD-10-CM | POA: Diagnosis not present

## 2016-12-04 DIAGNOSIS — I10 Essential (primary) hypertension: Secondary | ICD-10-CM | POA: Diagnosis not present

## 2016-12-04 DIAGNOSIS — R270 Ataxia, unspecified: Secondary | ICD-10-CM | POA: Diagnosis not present

## 2016-12-04 LAB — CBC
HEMATOCRIT: 27.2 % — AB (ref 36.0–46.0)
HEMOGLOBIN: 9.5 g/dL — AB (ref 12.0–15.0)
MCH: 27.3 pg (ref 26.0–34.0)
MCHC: 34.9 g/dL (ref 30.0–36.0)
MCV: 78.2 fL (ref 78.0–100.0)
Platelets: 390 10*3/uL (ref 150–400)
RBC: 3.48 MIL/uL — ABNORMAL LOW (ref 3.87–5.11)
RDW: 15.5 % (ref 11.5–15.5)
WBC: 7.3 10*3/uL (ref 4.0–10.5)

## 2016-12-04 LAB — BASIC METABOLIC PANEL
Anion gap: 5 (ref 5–15)
BUN: 10 mg/dL (ref 6–20)
CHLORIDE: 106 mmol/L (ref 101–111)
CO2: 25 mmol/L (ref 22–32)
CREATININE: 0.48 mg/dL (ref 0.44–1.00)
Calcium: 8.9 mg/dL (ref 8.9–10.3)
GFR calc non Af Amer: >60 mL/min (ref >60)
GLUCOSE: 90 mg/dL (ref 65–99)
Potassium: 3.7 mmol/L (ref 3.5–5.1)
Sodium: 136 mmol/L (ref 135–145)

## 2016-12-04 MED ORDER — GADOBENATE DIMEGLUMINE 529 MG/ML IV SOLN
15.0000 mL | Freq: Once | INTRAVENOUS | Status: AC | PRN
  Administered 2016-12-04: 12 mL via INTRAVENOUS

## 2016-12-04 MED ORDER — LORAZEPAM 2 MG/ML IJ SOLN
2.0000 mg | Freq: Once | INTRAMUSCULAR | Status: AC
  Administered 2016-12-04: 2 mg via INTRAVENOUS
  Filled 2016-12-04: qty 1

## 2016-12-04 NOTE — Progress Notes (Signed)
Unable to perform neuro check and VS at 0845, patient transported to [facility] for procedure.

## 2016-12-04 NOTE — Progress Notes (Signed)
PROGRESS NOTE                                                                                                                                                                                                             Patient Demographics:    Angel Price, is a 51 y.o. female, DOB - [birth year], FXT:[id number]  Admit date - 12/02/2016   Admitting Physician Elwin Mocha, MD  Outpatient Primary MD for the patient is Rosita Fire, MD  LOS - 0  Outpatient Specialists: NONE  Chief Complaint  Patient presents with  . Extremity Weakness       Brief Narrative   51 year old female with history of depression, bipolar disorder with suicidal attempts in past, cocaine and alcohol abuse, chronic abdominal pain, adrenal mass, tobacco abuse who presented with left upper and lower extremity weakness with left face numbness. He was hospitalized from 9/7-9/11 with similar symptoms and had MRI of the brain and cervical spine which was negative for infarct. MRI of the cervical spine showed C6-C7 left paracentral disc protrusion, C5-C6 broad-based disc bulge without foraminal stenosis or central cannot stenosis. Neurology was consulted who recommended this could be MRI negative infarct. A CT angiogram of the head and neck was also negative for acute findings. Patient was discharged home with outpatient follow-up. There was also concerned that she was malingering and a lot of her  symptoms were psychosomatic. Patient returned to the on 9/17 with worsening left upper and lower extremity weakness. MRI repeated on admission was negative for acute infarct. Neurology consulted and recommended obtaining MRI of the cervical spine with and without contrast to rule out cervical myelopathy.   Subjective:   Patient was sent to [facility] for MRI of the cervical spine to rule out spinal AVMs but she stated that she can only do MRI under sedation and since  there was no orders was sent back. On questioning patient reported that she usually responds to low-dose Ativan for MRI. Patient was sent back to the [facility] without me being clearly communicated about this. Patient had also left for [facility] Before I could see or examine her this morning. Patient reports that sister has weakness in her left arm and leg, however per nurse she has been noticed to be ambulating in her room without mention to weakness.   Assessment  &  Plan :    Principal Problem: Left-sided hemiparesis Etiology unclear. Neurology consult appreciated. Repeat MRI of the brain negative. MRA of the cervical spine ordered to rule out spinal AVM but could not be done at [facility] today due to confusion with sedation. Neurology consult appreciated here and recommended cervical spine MRI with and without contrast to rule out cervical myelopathy. -Since neurologist Dr [name] is on leave from today for next several days I discussed the case with neuro hospitalist at [facility] (Dr. Rory Percy). I also have significant concern based on my clinical exam and report from the nurses that patient may have some psychosomatic symptoms and she may be acting out some of her symptoms.  Dr. Rory Percy suggested obtaining a repeat brain and cervical spine MRI with and without contrast which should be able to rule out cervical myelopathy or possible spinal AVM. If negative she can be discharged home with PT with outpatient neurology follow-up. Continue aspirin. PT/OT eval.  Active problems   History of cocaine abuse  patient denies. UDS positive for opiates only.  Essential hypertension Stable. Continue lisinopril.  Hypokalemia Replenished  B12 deficiency  continue supplement  GERD Continue PPI neck line      Code Status :  Full code  Family Communication  :  Son at bedside  Disposition Plan  :home pending workup  Barriers For Discharge :  Active symptoms/pending workup  Consults  :     Neurology  Procedures  : MRI brain  DVT Prophylaxis  :  Lovenox -   Lab Results  Component Value Date   PLT 390 12/04/2016    Antibiotics  :    Anti-infectives    None        Objective:   Vitals:   12/04/16 0045 12/04/16 0445 12/04/16 1245 12/04/16 1454  BP: 111/78 103/76 (!) 129/97 131/87  Pulse: 83 73 90 71  Resp: 20 18 18 18   Temp: 98 F (36.7 C) 98 F (36.7 C) 98.6 F (37 C) 98.4 F (36.9 C)  TempSrc: Oral Oral Oral Oral  SpO2: 97% 97% 96% 97%  Weight:      Height:        Wt Readings from Last 3 Encounters:  12/02/16 61.2 kg (134 lb 15.1 oz)  11/23/16 61.6 kg (135 lb 14.4 oz)  11/01/16 62.1 kg (137 lb)     Intake/Output Summary (Last 24 hours) at 12/04/16 1715 Last data filed at 12/04/16 1300  Gross per 24 hour  Intake              400 ml  Output              300 ml  Net              100 ml     Physical Exam  Gen: not in distress HEENT:moist mucosa, supple neck Chest: clear b/l, no added sounds CVS: N S1&S2, no murmurs,  GI: soft, NT, ND, Musculoskeletal: warm, no edema CNS:  Alert and oriented, 3/5 power in LLE, 3/5 power in LUE, keeps her arms and legs flaccid seems to have normal tone and reflexes. Negative pronator drift. Patient unable to lift her left arm up when instructed but on after performed finger nose test  is able to do so without difficulty. On making her ambulate patient initially left her feet and tends to sway to the side but slowly starts dragging it.   Data Review:    CBC  Recent Labs Lab 12/02/16  1554 12/02/16 1608 12/04/16 0543  WBC 9.0  --  7.3  HGB 10.0* 10.2* 9.5*  HCT 28.4* 30.0* 27.2*  PLT 411*  --  390  MCV 78.5  --  78.2  MCH 27.6  --  27.3  MCHC 35.2  --  34.9  RDW 15.4  --  15.5  LYMPHSABS 3.5  --   --   MONOABS 0.9  --   --   EOSABS 0.3  --   --   BASOSABS 0.0  --   --     Chemistries   Recent Labs Lab 12/02/16 1554 12/02/16 1608 12/03/16 0357 12/04/16 0543  NA 136 138 135 136  K 3.7  4.1 3.1* 3.7  CL 104 103 104 106  CO2 26  --  24 25  GLUCOSE 92 88 99 90  BUN 9 8 8 10   CREATININE 0.43* 0.40* 0.43* 0.48  CALCIUM 9.5  --  8.8* 8.9  AST 24  --   --   --   ALT 22  --   --   --   ALKPHOS 98  --   --   --   BILITOT 0.3  --   --   --    ------------------------------------------------------------------------------------------------------------------ No results for input(s): CHOL, HDL, LDLCALC, TRIG, CHOLHDL, LDLDIRECT in the last 72 hours.  Lab Results  Component Value Date   HGBA1C 5.1 11/23/2016   ------------------------------------------------------------------------------------------------------------------ No results for input(s): TSH, T4TOTAL, T3FREE, THYROIDAB in the last 72 hours.  Invalid input(s): FREET3 ------------------------------------------------------------------------------------------------------------------ No results for input(s): VITAMINB12, FOLATE, FERRITIN, TIBC, IRON, RETICCTPCT in the last 72 hours.  Coagulation profile  Recent Labs Lab 12/02/16 1554  INR 0.95    No results for input(s): DDIMER in the last 72 hours.  Cardiac Enzymes No results for input(s): CKMB, TROPONINI, MYOGLOBIN in the last 168 hours.  Invalid input(s): CK ------------------------------------------------------------------------------------------------------------------    Component Value Date/Time   BNP 13.0 03/14/2014 1054    Inpatient Medications  Scheduled Meds: .  stroke: mapping our early stages of recovery book   Does not apply Once  . clopidogrel  75 mg Oral Daily  . heparin  5,000 Units Subcutaneous Q8H  . lisinopril  20 mg Oral Daily  . LORazepam  2 mg Intravenous Once  . pantoprazole  40 mg Oral Daily  . sucralfate  1 g Oral TID WC & HS  . vitamin B-12  5,000 mcg Oral Daily   Continuous Infusions: PRN Meds:.acetaminophen **OR** acetaminophen (TYLENOL) oral liquid 160 mg/5 mL **OR** acetaminophen, diphenhydrAMINE, hydrALAZINE,  oxyCODONE-acetaminophen, polyethylene glycol, senna-docusate  Micro Results No results found for this or any previous visit (from the past 240 hour(s)).  Radiology Reports Ct Angio Head W Or Wo Contrast  Result Date: 11/26/2016 CLINICAL DATA:  Initial evaluation for left-sided weakness for 1 week. EXAM: CT ANGIOGRAPHY HEAD TECHNIQUE: Multidetector CT imaging of the head was performed using the standard protocol during bolus administration of intravenous contrast. Multiplanar CT image reconstructions and MIPs were obtained to evaluate the vascular anatomy. CONTRAST:  75 cc of Isovue 370. COMPARISON:  Previous MRI from 11/23/2016. FINDINGS: CT HEAD Brain: Cerebral volume within normal limits for patient age. No evidence for acute intracranial hemorrhage. No findings to suggest acute large vessel territory infarct. No mass lesion, midline shift, or mass effect. Ventricles are normal in size without evidence for hydrocephalus. No extra-axial fluid collection identified. Vascular: No hyperdense vessel identified. Skull: Scalp soft tissues demonstrate no acute abnormality.Calvarium intact. Sinuses/Orbits: Globes and orbital soft  tissues are within normal limits. Visualized paranasal sinuses are clear. No mastoid effusion. CTA HEAD Anterior circulation: Distal cervical segments of the internal carotid artery's widely patent. Petrous, cavernous, and supraclinoid right ICA widely patent without flow-limiting stenosis. Petrous left ICA widely patent. Mild scattered plaque within the cavernous and supraclinoid left ICA without stenosis. ICA termini widely patent. A1 segments patent bilaterally. Anterior communicating artery normal. Anterior cerebral arteries widely patent to their distal aspects without stenosis. M1 segments widely patent without stenosis or occlusion. MCA bifurcations normal. No proximal M2 occlusion. Distal MCA branches well opacified and symmetric. Posterior circulation: Vertebral artery's widely  patent in the vertebrobasilar junction without stenosis. Posterior inferior cerebral arteries patent bilaterally. Basilar artery widely patent to its distal aspect. Superior cerebral arteries patent bilaterally. Both of the posterior cerebral artery is arise from the basilar artery and are widely patent to their distal aspects without stenosis. Small bilateral posterior communicating arteries noted. Venous sinuses: Patent. Anatomic variants: No significant anatomic variant. No aneurysm or vascular malformation. Delayed phase: No abnormal enhancement. IMPRESSION: Normal CTA of the head and neck. Electronically Signed   By: Jeannine Boga M.D.   On: 11/26/2016 14:55   Ct Head Wo Contrast  Result Date: 12/02/2016 CLINICAL DATA:  Left-sided weakness EXAM: CT HEAD WITHOUT CONTRAST TECHNIQUE: Contiguous axial images were obtained from the base of the skull through the vertex without intravenous contrast. COMPARISON:  November 26, 2016 head CT; brain MRI November 26, 2016 FINDINGS: Brain: The ventricles are normal in size and configuration. Mild prominence of the cisterna magna is a stable anatomic variant. There is no intracranial mass, hemorrhage, extra-axial fluid collection, or midline shift. No focal gray-white compartment lesions are demonstrated on this study. No acute infarct is demonstrable. Vascular: There is no demonstrable hyperdense vessel. There is calcification in both carotid siphon regions. Skull: The bony calvarium appears intact. Sinuses/Orbits: There is mild mucosal thickening in several ethmoid air cells bilaterally. Frontal sinuses are hypoplastic. Visualized paranasal sinuses elsewhere clear. Visualized orbits appear symmetric bilaterally. Other: Mastoid air cells are clear. IMPRESSION: No evident intracranial mass, hemorrhage, or extra-axial fluid collection. No focal gray-white compartment lesion is demonstrated on this study. Given patient's clinical symptoms, correlation with MR with  diffusion imaging may well be advisable. There are foci of arterial vascular calcification. There is ethmoid sinus mucosal thickening at several levels. Electronically Signed   By: Lowella Grip III M.D.   On: 12/02/2016 16:10   Ct Head Wo Contrast  Result Date: 11/22/2016 CLINICAL DATA:  Left-sided weakness EXAM: CT HEAD WITHOUT CONTRAST TECHNIQUE: Contiguous axial images were obtained from the base of the skull through the vertex without intravenous contrast. COMPARISON:  03/29/2015 FINDINGS: Brain: No evidence of acute infarction, hemorrhage, hydrocephalus, extra-axial collection or mass lesion/mass effect. Vascular: No hyperdense vessel.  Carotid artery calcification. Skull: Normal. Negative for fracture or focal lesion. Sinuses/Orbits: No acute finding. Other: None IMPRESSION: No CT evidence for acute intracranial abnormality. Electronically Signed   By: Donavan Foil M.D.   On: 11/22/2016 23:29   Mr Brain Wo Contrast  Result Date: 12/02/2016 CLINICAL DATA:  Initial evaluation for acute left-sided weakness for 2 days. EXAM: MRI HEAD WITHOUT CONTRAST TECHNIQUE: Multiplanar, multiecho pulse sequences of the brain and surrounding structures were obtained without intravenous contrast. COMPARISON:  Prior CT from earlier the same day as well as previous MRI from 11/23/2016. FINDINGS: Brain: Cerebral volume within normal limits. No focal parenchymal signal abnormality. No abnormal foci of restricted diffusion to suggest acute or subacute  ischemia. Gray-white matter differentiation well maintained. No encephalomalacia to suggest chronic infarction. No susceptibility fact to suggest acute or chronic intracranial hemorrhage. No mass lesion, midline shift or mass effect. No hydrocephalus. No extra-axial fluid collection. Major dural sinuses are patent. Incidental note made of a partially empty sella. Midline structures intact and normal. Vascular: Major intracranial vascular flow voids are well maintained.  Skull and upper cervical spine: Craniocervical junction normal. Visualized upper cervical spine unremarkable. Bone marrow signal intensity within normal limits. No scalp soft tissue abnormality. Sinuses/Orbits: Globes and orbital soft tissues within normal limits. Visualized paranasal sinuses are clear. No significant mastoid effusion. Inner ear structures normal. Other: None. IMPRESSION: Normal MRI of the brain. No acute intracranial abnormality identified. Electronically Signed   By: Jeannine Boga M.D.   On: 12/02/2016 18:21   Mr Brain Wo Contrast  Result Date: 11/23/2016 CLINICAL DATA:  Facial weakness EXAM: MRI HEAD WITHOUT CONTRAST TECHNIQUE: Multiplanar, multiecho pulse sequences of the brain and surrounding structures were obtained without intravenous contrast. COMPARISON:  CT 11/22/2016 FINDINGS: Brain: No acute infarction, hemorrhage, hydrocephalus, extra-axial collection or mass lesion. Image quality degraded by motion. Vascular: Normal arterial flow voids Skull and upper cervical spine: Negative Sinuses/Orbits: Mild mucosal edema paranasal sinuses.  Normal orbit Other: None IMPRESSION: Negative MRI head.  Image quality degraded by motion. Electronically Signed   By: Franchot Gallo M.D.   On: 11/23/2016 10:19   Mr Cervical Spine Wo Contrast  Result Date: 11/24/2016 CLINICAL DATA:  Left-sided weakness and numbness EXAM: MRI CERVICAL SPINE WITHOUT CONTRAST TECHNIQUE: Multiplanar, multisequence MR imaging of the cervical spine was performed. No intravenous contrast was administered. COMPARISON:  None. FINDINGS: Alignment: Physiologic. Vertebrae: No fracture, evidence of discitis, or bone lesion. Cord: Normal signal and morphology. Posterior Fossa, vertebral arteries, paraspinal tissues: Posterior fossa demonstrates no focal abnormality. Vertebral artery flow voids are maintained. Paraspinal soft tissues are unremarkable. Disc levels: Discs: Degenerative disc disease with disc height loss at C5-6.  C2-3: No significant disc bulge. No neural foraminal stenosis. No central canal stenosis. C3-4: No significant disc bulge. No neural foraminal stenosis. No central canal stenosis. C4-5: No significant disc bulge. No neural foraminal stenosis. No central canal stenosis. C5-6: Mild broad-based disc bulge. Bilateral uncovertebral degenerative changes. Mild bilateral foraminal stenosis. No central canal stenosis. C6-7: Shallow left paracentral disc protrusion. No neural foraminal stenosis. No central canal stenosis. C7-T1: No significant disc bulge. No neural foraminal stenosis. No central canal stenosis. IMPRESSION: 1. At C6-7 there is a shallow left paracentral disc protrusion. 2. At C5-6 there is a mild broad-based disc bulge. Bilateral uncovertebral degenerative changes. Mild bilateral foraminal stenosis. Electronically Signed   By: Kathreen Devoid   On: 11/24/2016 12:39   Ct Abdomen Pelvis W Contrast  Result Date: 11/25/2016 CLINICAL DATA:  Upper to mid abdominal pain beginning 3 days ago. Recent stroke. EXAM: CT ABDOMEN AND PELVIS WITH CONTRAST TECHNIQUE: Multidetector CT imaging of the abdomen and pelvis was performed using the standard protocol following bolus administration of intravenous contrast. CONTRAST:  61mL ISOVUE-300 IOPAMIDOL (ISOVUE-300) INJECTION 61%, 183mL ISOVUE-300 IOPAMIDOL (ISOVUE-300) INJECTION 61% COMPARISON:  09/23/2016 FINDINGS: Lower chest: Calcified granuloma right middle lobe. Atelectasis and both lower lobes left more than right. No pleural or pericardial fluid. Hepatobiliary: No liver parenchymal lesion is seen. Previous cholecystectomy. Less fatty change of the liver than seen previously. Pancreas: Some atrophic changes of the pancreas. No sign of mass or inflammation. Spleen: Normal Adrenals/Urinary Tract: Left adrenal gland appears surgically absent. Right adrenal gland contains  a low-density mass measuring 1.9 cm in diameter, unchanged since the previous study and consistent with a  benign adenoma. No renal parenchymal lesion is seen. Stomach/Bowel: Previous gastric bypass surgery. The stomach is full of material, presumably due to recent meal. No evidence of ileus, obstruction or free air. No other acute bowel finding. Vascular/Lymphatic: Aortic atherosclerosis. No aneurysm. IVC is normal. No retroperitoneal mass or adenopathy otherwise. Reproductive: Previous hysterectomy.  No pelvic mass. Other: No free fluid or air. Scarring of the anterior abdominal wall presumably related to previous surgeries. Musculoskeletal: Sclerotic change of the superior and inferior rami on the left consistent with insufficiency fractures or healing post traumatic fractures. IMPRESSION: No cause of acute abdominal pain is identified. Previous gastric bypass surgery. The stomach is full of material, presumably due to recent meal. Previous cholecystectomy. Previous right adrenal resection. Scarring of the anterior abdominal wall which appears the same as on the previous exam. Atelectasis in both lower lungs, left more than right. Chronic left adrenal adenoma. Aortic atherosclerosis. Healing fractures of the superior and inferior rami on the left, post traumatic versus insufficiency. Not present in July of this year. Electronically Signed   By: Nelson Chimes M.D.   On: 11/25/2016 15:18   US Carotid Bilateral  Result Date: 11/23/2016 CLINICAL DATA:  Weakness.  History of hypertension and smoking. EXAM: BILATERAL CAROTID DUPLEX ULTRASOUND TECHNIQUE: Pearline Cables scale imaging, color Doppler and duplex ultrasound were performed of bilateral carotid and vertebral arteries in the neck. COMPARISON:  None. FINDINGS: Criteria: Quantification of carotid stenosis is based on velocity parameters that correlate the residual internal carotid diameter with NASCET-based stenosis levels, using the diameter of the distal internal carotid lumen as the denominator for stenosis measurement. The following velocity measurements were obtained:  RIGHT ICA:  74/24 cm/sec CCA:  24/58 cm/sec SYSTOLIC ICA/CCA RATIO:  0.8 DIASTOLIC ICA/CCA RATIO:  1.2 ECA:  86 cm/sec LEFT ICA:  112/48 cm/sec CCA:  09/98 cm/sec SYSTOLIC ICA/CCA RATIO:  1.4 DIASTOLIC ICA/CCA RATIO:  2.1 ECA:  73 cm/sec RIGHT CAROTID ARTERY: There is no grayscale evidence of significant intimal thickening or atherosclerotic plaque affecting interrogated portions of the right carotid system. There are no elevated peak systolic velocities within the interrogated course of the right internal carotid artery to suggest a hemodynamically significant stenosis. RIGHT VERTEBRAL ARTERY:  Antegrade flow LEFT CAROTID ARTERY: There is no grayscale evidence of significant intimal thickening or atherosclerotic plaque affecting the interrogated portions of the left carotid system. There are no elevated peak systolic velocities within the interrogated course of the left internal carotid artery to suggest a hemodynamically significant stenosis. LEFT VERTEBRAL ARTERY:  Antegrade Flow IMPRESSION: Normal carotid Doppler ultrasound. Electronically Signed   By: Sandi Mariscal M.D.   On: 11/23/2016 12:53    Time Spent in minutes  25   Louellen Molder M.D on 12/04/2016 at 5:15 PM  Between 7am to 7pm - Pager - [phone]  After 7pm go to [email or web address] - password [facility]  [facility] -  Office  [phone]

## 2016-12-04 NOTE — Progress Notes (Signed)
Pt resting quietly in room. VSS, did not want heparin shot this am. Will take this afternoon. Will continue to monitor pt

## 2016-12-04 NOTE — Progress Notes (Signed)
SLP Cancellation Note  Patient Details Name: ALENA BLANKENBECKLER MRN: [id number] DOB: April 22, 1965   Cancelled treatment:       Reason Eval/Treat Not Completed: Patient at procedure or test/unavailable; Pt at [facility] for MRA per RN. Unable to complete SLE at this time.   Thank you,  Genene Churn, [place]    [place] 12/04/2016, 10:40 AM

## 2016-12-04 NOTE — Progress Notes (Signed)
OT Cancellation Note  Patient Details Name: Angel Price MRN: [id number] DOB: 1965/06/05   Cancelled Treatment:    Reason Eval/Treat Not Completed: Patient declined, no reason specified. Attempted to see pt 2x this am. Pt refused to participate in treatment session, stating "I'm not working with you this morning, I don't feel well." Educated pt on importance of participating in rehabilitation services while in acute care and on discharge; also informed pt that OT will not be available to come back later in the day, pt verbalized understanding. When speaking with pt, OT notes pt roll from lying on right side to neutral and sit up in bed to look at breakfast tray with no difficulty. Pt reports during the night she was ambulating to bathroom with walker and assistance from nursing with minimal difficulty.  [name], OTR/L  [phone] 12/04/2016, 8:20 AM

## 2016-12-04 NOTE — Progress Notes (Signed)
PT Cancellation Note  Patient Details Name: Angel Price MRN: [id number] DOB: [birth year]   Cancelled Treatment:    Reason Eval/Treat Not Completed: Other (comment) (Attempted PT session.  Pt stated she had just finished gait training with MD and did not wish to walk again.  )  Pt sitting on EOB with son present in room.  Attempted PT session, pt refused.  Reports she had just finished gait training with MD prior therapist arrival.  Pt left in bed with call bell within reach and son present in [place], [place]; [place]  Aldona Lento 12/04/2016, 3:46 PM

## 2016-12-05 DIAGNOSIS — Z885 Allergy status to narcotic agent status: Secondary | ICD-10-CM | POA: Diagnosis not present

## 2016-12-05 DIAGNOSIS — Z813 Family history of other psychoactive substance abuse and dependence: Secondary | ICD-10-CM | POA: Diagnosis not present

## 2016-12-05 DIAGNOSIS — Z800 Family history of malignant neoplasm of digestive organs: Secondary | ICD-10-CM | POA: Diagnosis not present

## 2016-12-05 DIAGNOSIS — Z823 Family history of stroke: Secondary | ICD-10-CM | POA: Diagnosis not present

## 2016-12-05 DIAGNOSIS — Z888 Allergy status to other drugs, medicaments and biological substances status: Secondary | ICD-10-CM | POA: Diagnosis not present

## 2016-12-05 DIAGNOSIS — F17210 Nicotine dependence, cigarettes, uncomplicated: Secondary | ICD-10-CM | POA: Diagnosis not present

## 2016-12-05 DIAGNOSIS — K219 Gastro-esophageal reflux disease without esophagitis: Secondary | ICD-10-CM | POA: Diagnosis not present

## 2016-12-05 DIAGNOSIS — E876 Hypokalemia: Secondary | ICD-10-CM | POA: Diagnosis not present

## 2016-12-05 DIAGNOSIS — G8194 Hemiplegia, unspecified affecting left nondominant side: Principal | ICD-10-CM | POA: Diagnosis present

## 2016-12-05 DIAGNOSIS — Z90710 Acquired absence of both cervix and uterus: Secondary | ICD-10-CM | POA: Diagnosis not present

## 2016-12-05 DIAGNOSIS — K3184 Gastroparesis: Secondary | ICD-10-CM | POA: Diagnosis not present

## 2016-12-05 DIAGNOSIS — F319 Bipolar disorder, unspecified: Secondary | ICD-10-CM | POA: Diagnosis not present

## 2016-12-05 DIAGNOSIS — Z915 Personal history of self-harm: Secondary | ICD-10-CM | POA: Diagnosis not present

## 2016-12-05 DIAGNOSIS — R29898 Other symptoms and signs involving the musculoskeletal system: Secondary | ICD-10-CM | POA: Diagnosis not present

## 2016-12-05 DIAGNOSIS — D573 Sickle-cell trait: Secondary | ICD-10-CM | POA: Diagnosis not present

## 2016-12-05 DIAGNOSIS — E538 Deficiency of other specified B group vitamins: Secondary | ICD-10-CM | POA: Diagnosis not present

## 2016-12-05 DIAGNOSIS — Z803 Family history of malignant neoplasm of breast: Secondary | ICD-10-CM | POA: Diagnosis not present

## 2016-12-05 DIAGNOSIS — Z765 Malingerer [conscious simulation]: Secondary | ICD-10-CM

## 2016-12-05 DIAGNOSIS — Z808 Family history of malignant neoplasm of other organs or systems: Secondary | ICD-10-CM | POA: Diagnosis not present

## 2016-12-05 DIAGNOSIS — Z87898 Personal history of other specified conditions: Secondary | ICD-10-CM | POA: Diagnosis not present

## 2016-12-05 DIAGNOSIS — K222 Esophageal obstruction: Secondary | ICD-10-CM | POA: Diagnosis not present

## 2016-12-05 DIAGNOSIS — R69 Illness, unspecified: Secondary | ICD-10-CM | POA: Diagnosis not present

## 2016-12-05 DIAGNOSIS — Z9049 Acquired absence of other specified parts of digestive tract: Secondary | ICD-10-CM | POA: Diagnosis not present

## 2016-12-05 DIAGNOSIS — I10 Essential (primary) hypertension: Secondary | ICD-10-CM | POA: Diagnosis not present

## 2016-12-05 DIAGNOSIS — Z818 Family history of other mental and behavioral disorders: Secondary | ICD-10-CM | POA: Diagnosis not present

## 2016-12-05 DIAGNOSIS — R531 Weakness: Secondary | ICD-10-CM | POA: Diagnosis present

## 2016-12-05 DIAGNOSIS — Z23 Encounter for immunization: Secondary | ICD-10-CM | POA: Diagnosis present

## 2016-12-05 DIAGNOSIS — R911 Solitary pulmonary nodule: Secondary | ICD-10-CM | POA: Diagnosis not present

## 2016-12-05 MED ORDER — LORAZEPAM 1 MG PO TABS
1.0000 mg | ORAL_TABLET | Freq: Once | ORAL | Status: AC
  Administered 2016-12-05: 1 mg via ORAL
  Filled 2016-12-05: qty 1

## 2016-12-05 NOTE — Discharge Summary (Signed)
Physician Discharge Summary  Angel Price HEN:[id number] DOB: 02/16/1966 DOA: 12/02/2016  PCP: Rosita Fire, MD  Admit date: 12/02/2016 Discharge date: 12/05/2016  Admitted From: home Disposition:  home  Recommendations for Outpatient Follow-up:  1. Follow up with PCP in 1-2 weeks 2. Outpatient referral to neurology  Home Health: PT/ OT Equipment/Devices: rolling walker  Discharge Condition: fair CODE STATUS:Full code Diet recommendation: Low sodium      Discharge Diagnoses:  Principal Problem:   Weakness of left lower extremity   Active Problems:   History of cocaine abuse   Bipolar disorder (HCC)   Essential hypertension, benign   Acute left hemiparesis (HCC)   Malingering  Brief narrative/history of present illness 51 year old female with history of depression, bipolar disorder with suicidal attempts in past, cocaine and alcohol abuse, chronic abdominal pain, adrenal mass, tobacco abuse who presented with left upper and lower extremity weakness with left face numbness. He was hospitalized from 9/7-9/11 with similar symptoms and had MRI of the brain and cervical spine which was negative for infarct. MRI of the cervical spine showed C6-C7 left paracentral disc protrusion, C5-C6 broad-based disc bulge without foraminal stenosis or central cannot stenosis. Neurology was consulted who recommended this could be MRI negative infarct. A CT angiogram of the head and neck was also negative for acute findings. Patient was discharged home with outpatient follow-up. There was also concerned that she was malingering and a lot of her  symptoms were psychosomatic. Patient returned to the on 9/17 with worsening left upper and lower extremity weakness. MRI repeated on admission was negative for acute infarct. Neurology consulted and recommended obtaining MRI of the cervical spine with and without contrast to rule out cervical myelopathy.  On 9/19,Patient was sent to [facility] for MRI of  the cervical spine to rule out spinal AVMs but she stated that she can only do MRI under sedation and since there was no orders was sent back. On questioning patient reported that she usually responds to low-dose Ativan for MRI. Patient was sent back to the [facility] without me being clearly communicated about this. Patient had also left for [facility] I could see or examine her this morning. Patient reports that sister has weakness in her left arm and leg, however per nurse she has been noticed to be ambulating in her room without mention to weakness.  Hospital course   Principal Problem:  Left-sided weakness Etiology unclear but I have strong suspicion that patient is acting out her symptoms. Repeat MRI of the brain done this admission negative. MRA of the cervical spine ordered to rule out spinal AVM but could not be done at [facility]  due to confusion with sedation. Neurology consult appreciated here and recommended cervical spine MRI with and without contrast to rule out cervical myelopathy. -Since neurologist Dr [name] was on leave for next several days I discussed the case with neuro hospitalist at [facility] (Dr. Rory Percy) on 9/19.  I also have significant concern based on my clinical exam and report from the nurses that patient may have some psychosomatic symptoms and she may be acting out some of her symptoms.  Dr. Rory Percy suggested obtaining a repeat brain and cervical spine MRI with and without contrast which should be able to rule out cervical myelopathy or possible spinal AVM. If negative she can be discharged home with PT with outpatient neurology follow-up.  A repeat MRI of the brain and cervical spine with and without contrast was negative for stroke  or any acute findings. Again showed cervical disc bulging without significant foraminal stenosis or spinal stenosis.  I have significant concern that patient is malingering and acting out her symptoms. On my examination  yesterday afternoon patient was unable to move her left arm when prompted and was dragging her left leg and acting being unsteady in her left leg but she was clearly able to perform left finger-nose test upon distraction. I was also notified by the nursing staff that patient was seen to be ambulating in her room without assistance.  While she was complaining of unable to move her left arm or ambulate, early this morning she independently walked out of the floor and when I was rounding in floor she was ambulating with a walker without any difficulty. I did not see any weakness in her left arm at all today.  Continue aspirin. Started on Plavix here but given that her symptoms are extremely less likely for stroke and suspicion for medication adherence and will not prescribe her Plavix. Will plan on home health PT and instructed to use rolling walker as needed.  I have also made outpatient referral to neurology if her symptoms recur.  Active problems   History of cocaine abuse  patient denies. UDS positive for opiates only.  Essential hypertension Stable. Continue lisinopril.  Hypokalemia Replenished  B12 deficiency  continue supplement  GERD Continue PPI      Family Communication  :   family at bedside  Disposition Plan  :home   Consults  :   Neurology  Procedures  : MRI brain x 2 MRI cervical spine without and with contrast   Discharge Instructions  Discharge Instructions    Ambulatory referral to Neurology    Complete by:  As directed    An appointment is requested in approximately: 4 weeks     Allergies as of 12/05/2016      Reactions   Codeine Hives   Morphine And Related Hives   Reglan [metoclopramide] Other (See Comments)   EPS symptoms       Medication List    STOP taking these medications   hydrocortisone 2.5 % rectal cream Commonly known as:  ANUSOL-HC   magnesium oxide 400 (241.3 Mg) MG tablet Commonly known as:  MAG-OX   potassium  chloride SA 20 MEQ tablet Commonly known as:  K-DUR,KLOR-CON   sucralfate 1 g tablet Commonly known as:  CARAFATE     TAKE these medications   aspirin 81 MG EC tablet Take 2 tablets (162 mg total) by mouth daily.   B-12 2500 MCG Tabs Take 5,000 mcg by mouth daily.   lisinopril 20 MG tablet Commonly known as:  PRINIVIL,ZESTRIL Take 1 tablet (20 mg total) by mouth daily.   nicotine 21 mg/24hr patch Commonly known as:  NICODERM CQ - dosed in mg/24 hours Place 1 patch (21 mg total) onto the skin daily.   pantoprazole 20 MG tablet Commonly known as:  PROTONIX Take 1 tablet (20 mg total) by mouth daily.   polyethylene glycol packet Commonly known as:  MIRALAX / GLYCOLAX Take 17 g by mouth daily as needed for moderate constipation or severe constipation.            Discharge Care Instructions        Start     Ordered   12/05/16 0000  Ambulatory referral to Neurology    Comments:  An appointment is requested in approximately: 4 weeks   12/05/16 1122     Follow-up Information  Rosita Fire, MD. Daphane Shepherd on 12/12/2016.   Specialty:  Internal Medicine Why:  at [facility] information: [place] [id number] [phone]        Phillips Odor, MD. Go on 01/01/2017.   Specialty:  Neurology Why:  at 9:15am Contact information: [address] [place] [address] [phone]          Allergies  Allergen Reactions  . Codeine Hives  . Morphine And Related Hives  . Reglan [Metoclopramide] Other (See Comments)    EPS symptoms        Procedures/Studies: Ct Angio Head W Or Wo Contrast  Result Date: 11/26/2016 CLINICAL DATA:  Initial evaluation for left-sided weakness for 1 week. EXAM: CT ANGIOGRAPHY HEAD TECHNIQUE: Multidetector CT imaging of the head was performed using the standard protocol during bolus administration of intravenous contrast. Multiplanar CT image reconstructions and MIPs were obtained to evaluate the  vascular anatomy. CONTRAST:  75 cc of Isovue 370. COMPARISON:  Previous MRI from 11/23/2016. FINDINGS: CT HEAD Brain: Cerebral volume within normal limits for patient age. No evidence for acute intracranial hemorrhage. No findings to suggest acute large vessel territory infarct. No mass lesion, midline shift, or mass effect. Ventricles are normal in size without evidence for hydrocephalus. No extra-axial fluid collection identified. Vascular: No hyperdense vessel identified. Skull: Scalp soft tissues demonstrate no acute abnormality.Calvarium intact. Sinuses/Orbits: Globes and orbital soft tissues are within normal limits. Visualized paranasal sinuses are clear. No mastoid effusion. CTA HEAD Anterior circulation: Distal cervical segments of the internal carotid artery's widely patent. Petrous, cavernous, and supraclinoid right ICA widely patent without flow-limiting stenosis. Petrous left ICA widely patent. Mild scattered plaque within the cavernous and supraclinoid left ICA without stenosis. ICA termini widely patent. A1 segments patent bilaterally. Anterior communicating artery normal. Anterior cerebral arteries widely patent to their distal aspects without stenosis. M1 segments widely patent without stenosis or occlusion. MCA bifurcations normal. No proximal M2 occlusion. Distal MCA branches well opacified and symmetric. Posterior circulation: Vertebral artery's widely patent in the vertebrobasilar junction without stenosis. Posterior inferior cerebral arteries patent bilaterally. Basilar artery widely patent to its distal aspect. Superior cerebral arteries patent bilaterally. Both of the posterior cerebral artery is arise from the basilar artery and are widely patent to their distal aspects without stenosis. Small bilateral posterior communicating arteries noted. Venous sinuses: Patent. Anatomic variants: No significant anatomic variant. No aneurysm or vascular malformation. Delayed phase: No abnormal  enhancement. IMPRESSION: Normal CTA of the head and neck. Electronically Signed   By: Jeannine Boga M.D.   On: 11/26/2016 14:55   Ct Head Wo Contrast  Result Date: 12/02/2016 CLINICAL DATA:  Left-sided weakness EXAM: CT HEAD WITHOUT CONTRAST TECHNIQUE: Contiguous axial images were obtained from the base of the skull through the vertex without intravenous contrast. COMPARISON:  November 26, 2016 head CT; brain MRI November 26, 2016 FINDINGS: Brain: The ventricles are normal in size and configuration. Mild prominence of the cisterna magna is a stable anatomic variant. There is no intracranial mass, hemorrhage, extra-axial fluid collection, or midline shift. No focal gray-white compartment lesions are demonstrated on this study. No acute infarct is demonstrable. Vascular: There is no demonstrable hyperdense vessel. There is calcification in both carotid siphon regions. Skull: The bony calvarium appears intact. Sinuses/Orbits: There is mild mucosal thickening in several ethmoid air cells bilaterally. Frontal sinuses are hypoplastic. Visualized paranasal sinuses elsewhere clear. Visualized orbits appear symmetric bilaterally. Other: Mastoid air cells are clear. IMPRESSION: No evident intracranial mass, hemorrhage,  or extra-axial fluid collection. No focal gray-white compartment lesion is demonstrated on this study. Given patient's clinical symptoms, correlation with MR with diffusion imaging may well be advisable. There are foci of arterial vascular calcification. There is ethmoid sinus mucosal thickening at several levels. Electronically Signed   By: Lowella Grip III M.D.   On: 12/02/2016 16:10   Ct Head Wo Contrast  Result Date: 11/22/2016 CLINICAL DATA:  Left-sided weakness EXAM: CT HEAD WITHOUT CONTRAST TECHNIQUE: Contiguous axial images were obtained from the base of the skull through the vertex without intravenous contrast. COMPARISON:  03/29/2015 FINDINGS: Brain: No evidence of acute  infarction, hemorrhage, hydrocephalus, extra-axial collection or mass lesion/mass effect. Vascular: No hyperdense vessel.  Carotid artery calcification. Skull: Normal. Negative for fracture or focal lesion. Sinuses/Orbits: No acute finding. Other: None IMPRESSION: No CT evidence for acute intracranial abnormality. Electronically Signed   By: Donavan Foil M.D.   On: 11/22/2016 23:29   Mr Brain Wo Contrast  Result Date: 12/02/2016 CLINICAL DATA:  Initial evaluation for acute left-sided weakness for 2 days. EXAM: MRI HEAD WITHOUT CONTRAST TECHNIQUE: Multiplanar, multiecho pulse sequences of the brain and surrounding structures were obtained without intravenous contrast. COMPARISON:  Prior CT from earlier the same day as well as previous MRI from 11/23/2016. FINDINGS: Brain: Cerebral volume within normal limits. No focal parenchymal signal abnormality. No abnormal foci of restricted diffusion to suggest acute or subacute ischemia. Gray-white matter differentiation well maintained. No encephalomalacia to suggest chronic infarction. No susceptibility fact to suggest acute or chronic intracranial hemorrhage. No mass lesion, midline shift or mass effect. No hydrocephalus. No extra-axial fluid collection. Major dural sinuses are patent. Incidental note made of a partially empty sella. Midline structures intact and normal. Vascular: Major intracranial vascular flow voids are well maintained. Skull and upper cervical spine: Craniocervical junction normal. Visualized upper cervical spine unremarkable. Bone marrow signal intensity within normal limits. No scalp soft tissue abnormality. Sinuses/Orbits: Globes and orbital soft tissues within normal limits. Visualized paranasal sinuses are clear. No significant mastoid effusion. Inner ear structures normal. Other: None. IMPRESSION: Normal MRI of the brain. No acute intracranial abnormality identified. Electronically Signed   By: Jeannine Boga M.D.   On: 12/02/2016 18:21    Mr Brain Wo Contrast  Result Date: 11/23/2016 CLINICAL DATA:  Facial weakness EXAM: MRI HEAD WITHOUT CONTRAST TECHNIQUE: Multiplanar, multiecho pulse sequences of the brain and surrounding structures were obtained without intravenous contrast. COMPARISON:  CT 11/22/2016 FINDINGS: Brain: No acute infarction, hemorrhage, hydrocephalus, extra-axial collection or mass lesion. Image quality degraded by motion. Vascular: Normal arterial flow voids Skull and upper cervical spine: Negative Sinuses/Orbits: Mild mucosal edema paranasal sinuses.  Normal orbit Other: None IMPRESSION: Negative MRI head.  Image quality degraded by motion. Electronically Signed   By: Franchot Gallo M.D.   On: 11/23/2016 10:19   Mr Jeri Cos QI Contrast  Result Date: 12/04/2016 CLINICAL DATA:  Initial evaluation for acute ataxia for 2 days. Evaluate for stroke. EXAM: MRI HEAD WITHOUT AND WITH CONTRAST MRI CERVICAL SPINE WITHOUT AND WITH CONTRAST TECHNIQUE: Multiplanar, multiecho pulse sequences of the brain and surrounding structures, and cervical spine, to include the craniocervical junction and cervicothoracic junction, were obtained without and with intravenous contrast. CONTRAST:  62mL MULTIHANCE GADOBENATE DIMEGLUMINE 529 MG/ML IV SOLN COMPARISON:  Prior CT and MRI from 12/02/2016. FINDINGS: MRI HEAD FINDINGS Brain: Cerebral volume within normal limits for age. Few tiny subcentimeter T2/FLAIR hyperintensities noted within the periventricular deep white matter, nonspecific, within normal limits for age. No  abnormal foci of restricted diffusion to suggest acute or subacute ischemia. Gray-white matter differentiation maintained. No encephalomalacia to suggest chronic infarction. No susceptibility artifact to suggest acute or chronic intracranial hemorrhage. No mass lesion, midline shift or mass effect. No hydrocephalus. No extra-axial fluid collection. Major dural sinuses are patent. No abnormal enhancement. Incidental note made of an  empty sella. Midline structures intact and normal. Vascular: Major intracranial vascular flow voids maintained. Skull and upper cervical spine: Craniocervical junction normal. Bone marrow signal intensity normal. No scalp soft tissue abnormality. Sinuses/Orbits: Globes and orbital soft tissues within normal limits. Paranasal sinuses are clear. Trace opacity bilateral mastoid air cells. Inner ear structures normal. Other: None. MRI CERVICAL SPINE FINDINGS Alignment: Study degraded by motion artifact. Straightening with slight reversal of the normal cervical lordosis, apex at C5-6. No listhesis. Vertebrae: Vertebral body heights maintained. No evidence for acute or chronic fracture. Bone marrow signal intensity within normal limits. No discrete or worrisome osseous lesions. No abnormal enhancement. Cord: Signal intensity within the cervical spinal cord is within normal limits. Posterior Fossa, vertebral arteries, paraspinal tissues: Craniocervical junction within normal limits. Paraspinous and prevertebral soft tissues within normal limits. Normal intravascular flow voids present within the vertebral arteries bilaterally. Disc levels: C2-C3: Shallow central disc protrusion minimally indents the ventral thecal sac. No significant canal or foraminal stenosis. C3-C4:  Minimal disc bulge.  No canal or foraminal stenosis. C4-C5:  Mild diffuse disc bulge.  No significant stenosis. C5-C6: Chronic diffuse degenerative disc osteophyte with intervertebral disc space narrowing. Mild bilateral uncovertebral hypertrophy. Broad posterior component indents and partially faces the ventral thecal sac with resultant mild canal stenosis. Mild left C6 foraminal stenosis. No significant right foraminal narrowing. C6-C7: Central/left paracentral disc protrusion indents the ventral thecal sac. No significant cord deformity or stenosis. Foramina are widely patent. C7-T1:  Unremarkable. Visualized upper thoracic spine within normal limits.  IMPRESSION: MRI HEAD IMPRESSION: 1. Stable essentially normal MRI appearance of the brain. No acute intracranial infarct or other abnormality. 2. Empty sella. MRI CERVICAL SPINE IMPRESSION: 1. No acute abnormality within the cervical spine. Normal MRI appearance of the cervical spinal cord. 2. Degenerative disc osteophyte at C5-6 with resultant mild canal and left C6 foraminal stenosis. 3. Central disc protrusion at C6-7 without significant stenosis. Electronically Signed   By: Jeannine Boga M.D.   On: 12/04/2016 21:07   Mr Cervical Spine Wo Contrast  Result Date: 11/24/2016 CLINICAL DATA:  Left-sided weakness and numbness EXAM: MRI CERVICAL SPINE WITHOUT CONTRAST TECHNIQUE: Multiplanar, multisequence MR imaging of the cervical spine was performed. No intravenous contrast was administered. COMPARISON:  None. FINDINGS: Alignment: Physiologic. Vertebrae: No fracture, evidence of discitis, or bone lesion. Cord: Normal signal and morphology. Posterior Fossa, vertebral arteries, paraspinal tissues: Posterior fossa demonstrates no focal abnormality. Vertebral artery flow voids are maintained. Paraspinal soft tissues are unremarkable. Disc levels: Discs: Degenerative disc disease with disc height loss at C5-6. C2-3: No significant disc bulge. No neural foraminal stenosis. No central canal stenosis. C3-4: No significant disc bulge. No neural foraminal stenosis. No central canal stenosis. C4-5: No significant disc bulge. No neural foraminal stenosis. No central canal stenosis. C5-6: Mild broad-based disc bulge. Bilateral uncovertebral degenerative changes. Mild bilateral foraminal stenosis. No central canal stenosis. C6-7: Shallow left paracentral disc protrusion. No neural foraminal stenosis. No central canal stenosis. C7-T1: No significant disc bulge. No neural foraminal stenosis. No central canal stenosis. IMPRESSION: 1. At C6-7 there is a shallow left paracentral disc protrusion. 2. At C5-6 there is a mild  broad-based disc bulge. Bilateral uncovertebral degenerative changes. Mild bilateral foraminal stenosis. Electronically Signed   By: Kathreen Devoid   On: 11/24/2016 12:39   Mr Cervical Spine W Wo Contrast  Result Date: 12/04/2016 CLINICAL DATA:  Initial evaluation for acute ataxia for 2 days. Evaluate for stroke. EXAM: MRI HEAD WITHOUT AND WITH CONTRAST MRI CERVICAL SPINE WITHOUT AND WITH CONTRAST TECHNIQUE: Multiplanar, multiecho pulse sequences of the brain and surrounding structures, and cervical spine, to include the craniocervical junction and cervicothoracic junction, were obtained without and with intravenous contrast. CONTRAST:  42mL MULTIHANCE GADOBENATE DIMEGLUMINE 529 MG/ML IV SOLN COMPARISON:  Prior CT and MRI from 12/02/2016. FINDINGS: MRI HEAD FINDINGS Brain: Cerebral volume within normal limits for age. Few tiny subcentimeter T2/FLAIR hyperintensities noted within the periventricular deep white matter, nonspecific, within normal limits for age. No abnormal foci of restricted diffusion to suggest acute or subacute ischemia. Gray-white matter differentiation maintained. No encephalomalacia to suggest chronic infarction. No susceptibility artifact to suggest acute or chronic intracranial hemorrhage. No mass lesion, midline shift or mass effect. No hydrocephalus. No extra-axial fluid collection. Major dural sinuses are patent. No abnormal enhancement. Incidental note made of an empty sella. Midline structures intact and normal. Vascular: Major intracranial vascular flow voids maintained. Skull and upper cervical spine: Craniocervical junction normal. Bone marrow signal intensity normal. No scalp soft tissue abnormality. Sinuses/Orbits: Globes and orbital soft tissues within normal limits. Paranasal sinuses are clear. Trace opacity bilateral mastoid air cells. Inner ear structures normal. Other: None. MRI CERVICAL SPINE FINDINGS Alignment: Study degraded by motion artifact. Straightening with slight  reversal of the normal cervical lordosis, apex at C5-6. No listhesis. Vertebrae: Vertebral body heights maintained. No evidence for acute or chronic fracture. Bone marrow signal intensity within normal limits. No discrete or worrisome osseous lesions. No abnormal enhancement. Cord: Signal intensity within the cervical spinal cord is within normal limits. Posterior Fossa, vertebral arteries, paraspinal tissues: Craniocervical junction within normal limits. Paraspinous and prevertebral soft tissues within normal limits. Normal intravascular flow voids present within the vertebral arteries bilaterally. Disc levels: C2-C3: Shallow central disc protrusion minimally indents the ventral thecal sac. No significant canal or foraminal stenosis. C3-C4:  Minimal disc bulge.  No canal or foraminal stenosis. C4-C5:  Mild diffuse disc bulge.  No significant stenosis. C5-C6: Chronic diffuse degenerative disc osteophyte with intervertebral disc space narrowing. Mild bilateral uncovertebral hypertrophy. Broad posterior component indents and partially faces the ventral thecal sac with resultant mild canal stenosis. Mild left C6 foraminal stenosis. No significant right foraminal narrowing. C6-C7: Central/left paracentral disc protrusion indents the ventral thecal sac. No significant cord deformity or stenosis. Foramina are widely patent. C7-T1:  Unremarkable. Visualized upper thoracic spine within normal limits. IMPRESSION: MRI HEAD IMPRESSION: 1. Stable essentially normal MRI appearance of the brain. No acute intracranial infarct or other abnormality. 2. Empty sella. MRI CERVICAL SPINE IMPRESSION: 1. No acute abnormality within the cervical spine. Normal MRI appearance of the cervical spinal cord. 2. Degenerative disc osteophyte at C5-6 with resultant mild canal and left C6 foraminal stenosis. 3. Central disc protrusion at C6-7 without significant stenosis. Electronically Signed   By: Jeannine Boga M.D.   On: 12/04/2016 21:07    Ct Abdomen Pelvis W Contrast  Result Date: 11/25/2016 CLINICAL DATA:  Upper to mid abdominal pain beginning 3 days ago. Recent stroke. EXAM: CT ABDOMEN AND PELVIS WITH CONTRAST TECHNIQUE: Multidetector CT imaging of the abdomen and pelvis was performed using the standard protocol following bolus administration of intravenous contrast. CONTRAST:  7mL  ISOVUE-300 IOPAMIDOL (ISOVUE-300) INJECTION 61%, 1101mL ISOVUE-300 IOPAMIDOL (ISOVUE-300) INJECTION 61% COMPARISON:  09/23/2016 FINDINGS: Lower chest: Calcified granuloma right middle lobe. Atelectasis and both lower lobes left more than right. No pleural or pericardial fluid. Hepatobiliary: No liver parenchymal lesion is seen. Previous cholecystectomy. Less fatty change of the liver than seen previously. Pancreas: Some atrophic changes of the pancreas. No sign of mass or inflammation. Spleen: Normal Adrenals/Urinary Tract: Left adrenal gland appears surgically absent. Right adrenal gland contains a low-density mass measuring 1.9 cm in diameter, unchanged since the previous study and consistent with a benign adenoma. No renal parenchymal lesion is seen. Stomach/Bowel: Previous gastric bypass surgery. The stomach is full of material, presumably due to recent meal. No evidence of ileus, obstruction or free air. No other acute bowel finding. Vascular/Lymphatic: Aortic atherosclerosis. No aneurysm. IVC is normal. No retroperitoneal mass or adenopathy otherwise. Reproductive: Previous hysterectomy.  No pelvic mass. Other: No free fluid or air. Scarring of the anterior abdominal wall presumably related to previous surgeries. Musculoskeletal: Sclerotic change of the superior and inferior rami on the left consistent with insufficiency fractures or healing post traumatic fractures. IMPRESSION: No cause of acute abdominal pain is identified. Previous gastric bypass surgery. The stomach is full of material, presumably due to recent meal. Previous cholecystectomy. Previous  right adrenal resection. Scarring of the anterior abdominal wall which appears the same as on the previous exam. Atelectasis in both lower lungs, left more than right. Chronic left adrenal adenoma. Aortic atherosclerosis. Healing fractures of the superior and inferior rami on the left, post traumatic versus insufficiency. Not present in July of this year. Electronically Signed   By: Nelson Chimes M.D.   On: 11/25/2016 15:18   US Carotid Bilateral  Result Date: 11/23/2016 CLINICAL DATA:  Weakness.  History of hypertension and smoking. EXAM: BILATERAL CAROTID DUPLEX ULTRASOUND TECHNIQUE: Pearline Cables scale imaging, color Doppler and duplex ultrasound were performed of bilateral carotid and vertebral arteries in the neck. COMPARISON:  None. FINDINGS: Criteria: Quantification of carotid stenosis is based on velocity parameters that correlate the residual internal carotid diameter with NASCET-based stenosis levels, using the diameter of the distal internal carotid lumen as the denominator for stenosis measurement. The following velocity measurements were obtained: RIGHT ICA:  74/24 cm/sec CCA:  36/14 cm/sec SYSTOLIC ICA/CCA RATIO:  0.8 DIASTOLIC ICA/CCA RATIO:  1.2 ECA:  86 cm/sec LEFT ICA:  112/48 cm/sec CCA:  43/15 cm/sec SYSTOLIC ICA/CCA RATIO:  1.4 DIASTOLIC ICA/CCA RATIO:  2.1 ECA:  73 cm/sec RIGHT CAROTID ARTERY: There is no grayscale evidence of significant intimal thickening or atherosclerotic plaque affecting interrogated portions of the right carotid system. There are no elevated peak systolic velocities within the interrogated course of the right internal carotid artery to suggest a hemodynamically significant stenosis. RIGHT VERTEBRAL ARTERY:  Antegrade flow LEFT CAROTID ARTERY: There is no grayscale evidence of significant intimal thickening or atherosclerotic plaque affecting the interrogated portions of the left carotid system. There are no elevated peak systolic velocities within the interrogated course of the  left internal carotid artery to suggest a hemodynamically significant stenosis. LEFT VERTEBRAL ARTERY:  Antegrade Flow IMPRESSION: Normal carotid Doppler ultrasound. Electronically Signed   By: Sandi Mariscal M.D.   On: 11/23/2016 12:53       Subjective: Informs her left-sided weakness to be better. She is ambulating in the hallway with a walker without any difficulty.  Discharge Exam: Vitals:   12/05/16 0700 12/05/16 1100  BP: 104/68 115/81  Pulse: 74 (!) 109  Resp: 18  18  Temp: 98 F (36.7 C) 97.9 F (36.6 C)  SpO2: 97% 100%   Vitals:   12/04/16 2300 12/05/16 0300 12/05/16 0700 12/05/16 1100  BP: 125/76 102/73 104/68 115/81  Pulse: 80 78 74 (!) 109  Resp: 16 18 18 18   Temp: 98 F (36.7 C) (!) 97.5 F (36.4 C) 98 F (36.7 C) 97.9 F (36.6 C)  TempSrc: Oral Oral Oral Oral  SpO2: 99% 100% 97% 100%  Weight:      Height:         Gen: not in distress HEENT:moist mucosa, supple neck Chest: clear b/l, no added sounds CVS: N S1&S2, no murmurs,  GI: soft, NT, ND, Musculoskeletal: warm, no edema CNS:  Alert and oriented, 4+/5 power in both left upper and lower extremity,  The results of significant diagnostics from this hospitalization (including imaging, microbiology, ancillary and laboratory) are listed below for reference.     Microbiology: No results found for this or any previous visit (from the past 240 hour(s)).   Labs: BNP (last 3 results) No results for input(s): BNP in the last 8760 hours. Basic Metabolic Panel:  Recent Labs Lab 12/02/16 1554 12/02/16 1608 12/03/16 0357 12/04/16 0543  NA 136 138 135 136  K 3.7 4.1 3.1* 3.7  CL 104 103 104 106  CO2 26  --  24 25  GLUCOSE 92 88 99 90  BUN 9 8 8 10   CREATININE 0.43* 0.40* 0.43* 0.48  CALCIUM 9.5  --  8.8* 8.9   Liver Function Tests:  Recent Labs Lab 12/02/16 1554  AST 24  ALT 22  ALKPHOS 98  BILITOT 0.3  PROT 7.1  ALBUMIN 3.3*   No results for input(s): LIPASE, AMYLASE in the last 168  hours. No results for input(s): AMMONIA in the last 168 hours. CBC:  Recent Labs Lab 12/02/16 1554 12/02/16 1608 12/04/16 0543  WBC 9.0  --  7.3  NEUTROABS 4.3  --   --   HGB 10.0* 10.2* 9.5*  HCT 28.4* 30.0* 27.2*  MCV 78.5  --  78.2  PLT 411*  --  390   Cardiac Enzymes: No results for input(s): CKTOTAL, CKMB, CKMBINDEX, TROPONINI in the last 168 hours. BNP: Invalid input(s): POCBNP CBG: No results for input(s): GLUCAP in the last 168 hours. D-Dimer No results for input(s): DDIMER in the last 72 hours. Hgb A1c No results for input(s): HGBA1C in the last 72 hours. Lipid Profile No results for input(s): CHOL, HDL, LDLCALC, TRIG, CHOLHDL, LDLDIRECT in the last 72 hours. Thyroid function studies No results for input(s): TSH, T4TOTAL, T3FREE, THYROIDAB in the last 72 hours.  Invalid input(s): FREET3 Anemia work up No results for input(s): VITAMINB12, FOLATE, FERRITIN, TIBC, IRON, RETICCTPCT in the last 72 hours. Urinalysis    Component Value Date/Time   COLORURINE STRAW (A) 12/02/2016 1545   APPEARANCEUR CLEAR 12/02/2016 1545   LABSPEC 1.003 (L) 12/02/2016 1545   PHURINE 5.0 12/02/2016 1545   GLUCOSEU NEGATIVE 12/02/2016 1545   HGBUR NEGATIVE 12/02/2016 1545   BILIRUBINUR NEGATIVE 12/02/2016 1545   KETONESUR NEGATIVE 12/02/2016 1545   PROTEINUR NEGATIVE 12/02/2016 1545   UROBILINOGEN 0.2 11/26/2014 0915   NITRITE NEGATIVE 12/02/2016 1545   LEUKOCYTESUR NEGATIVE 12/02/2016 1545   Sepsis Labs Invalid input(s): PROCALCITONIN,  WBC,  LACTICIDVEN Microbiology No results found for this or any previous visit (from the past 240 hour(s)).   Time coordinating discharge: Over 30 minutes  SIGNED:   Louellen Molder, MD  [facility] 12/05/2016, 1:47  PM Pager   If 7PM-7AM, please contact night-coverage [email or web address] Password [email or web address]

## 2016-12-05 NOTE — Progress Notes (Signed)
Occupational Therapy Treatment Patient Details Name: Angel Price MRN: [id number] DOB: Dec 27, 1965 Today's Date: 12/05/2016    History of present illness Angel HACKWORTH is a 51 y.o. female  with past medical history significant for adrenal mass, etoh abuse, depression, gastroparesis, thyroid disease and suicide attempt since the emergency room chief complaint of left lower extremity weakness. Patient states that over the last week she's had a gradual onset of a headache. Severe headache. In addition she's had worsening of her left upper extremity weakness. His sudden onset of left lower extremity weakness earlier today. Patient states she has a family history of strokes. Mother had a stroke. Maternal grandmother had stroke. Her brother has had a stroke.   OT comments  Pt up with RW heading to bathroom on OT arrival into room. Pt performing B/ADL tasks with no difficulties with LUE, pt is using RW however no balance deficits noted with and without RW during standing tasks. Pt noted to reach for items with LUE, pulling covers up with LUE, getting into bed using BLE with no difficulties. When discussed with pt she states she is "feeling better." Pt is at baseline with B/ADL completion, LUE strength is functional and coordination is intact. Nursing notes pt has been getting up at night by herself with no difficulties. No further OT services necessary in acute care or on discharge.    Follow Up Recommendations  No OT follow up    Equipment Recommendations  None recommended by OT       Precautions / Restrictions Precautions Precautions: None Restrictions Weight Bearing Restrictions: No LLE Weight Bearing: Non weight bearing       Mobility Bed Mobility Overal bed mobility: Independent                Transfers Overall transfer level: Independent                        ADL either performed or assessed with clinical judgement   ADL Overall ADL's : Modified  independent;At baseline Eating/Feeding: Modified independent   Grooming: Wash/dry hands;Wash/dry face;Modified independent;Standing Grooming Details (indicate cue type and reason): Pt washing hands, reaching for paper towels with left hand. No difficulties Upper Body Bathing: Modified independent;Sitting   Lower Body Bathing: Modified independent;Sitting/lateral leans   Upper Body Dressing : Modified independent;Standing   Lower Body Dressing: Modified independent;Sitting/lateral leans Lower Body Dressing Details (indicate cue type and reason): No difficulties using BUE to fix socks Toilet Transfer: Modified Independent;RW Toilet Transfer Details (indicate cue type and reason): Pt performed transfer with no difficulties. Had RW available but did not use Toileting- Water quality scientist and Hygiene: Modified independent;Sit to/from stand       Functional mobility during ADLs: Modified independent;Rolling walker General ADL Comments: Pt now using BUE for all B/IADL tasks with no difficulties     Vision   Vision Assessment?: No apparent visual deficits   Perception     Praxis      Cognition Arousal/Alertness: Awake/alert Behavior During Therapy: WFL for tasks assessed/performed Overall Cognitive Status: Within Functional Limits for tasks assessed                                                     Pertinent Vitals/ Pain       Pain Assessment: No/denies pain  Progress Toward Goals  OT Goals(current goals can now be found in the care plan section)  Progress towards OT goals: Goals met/education completed, patient discharged from OT  Acute Rehab OT Goals Patient Stated Goal: Return home after rehab ADL Goals Pt Will Perform Grooming: with supervision;standing Pt Will Perform Lower Body Dressing: with supervision;sitting/lateral leans Pt Will Transfer to Toilet: with modified independence;ambulating;regular height toilet Pt/caregiver will  Perform Home Exercise Program: Increased strength;Left upper extremity;With theraband;Independently;With written HEP provided  Plan All goals met and education completed, patient discharged from OT services    Co-evaluation                 AM-PAC PT "6 Clicks" Daily Activity     Outcome Measure   Help from another person eating meals?: None Help from another person taking care of personal grooming?: None Help from another person toileting, which includes using toliet, bedpan, or urinal?: None Help from another person bathing (including washing, rinsing, drying)?: None Help from another person to put on and taking off regular upper body clothing?: None Help from another person to put on and taking off regular lower body clothing?: None 6 Click Score: 24    End of Session Equipment Utilized During Treatment: Rolling walker  OT Visit Diagnosis: Muscle weakness (generalized) (M62.81);Hemiplegia and hemiparesis   Activity Tolerance Patient tolerated treatment well   Patient Left in bed;with call bell/phone within reach   Nurse Communication      Functional Assessment Tool Used: AM-PAC 6 Clicks Daily Activity Functional Limitation: Self care Self Care Current Status (L8756): 0 percent impaired, limited or restricted Self Care Goal Status (E3329): At least 20 percent but less than 40 percent impaired, limited or restricted Self Care Discharge Status [phone]): 0 percent impaired, limited or restricted   Time: 0801-0817 OT Time Calculation (min): 16 min  Charges: OT G-codes **NOT FOR INPATIENT CLASS** Functional Assessment Tool Used: AM-PAC 6 Clicks Daily Activity Functional Limitation: Self care Self Care Current Status (Z6606): 0 percent impaired, limited or restricted Self Care Goal Status (T0160): At least 20 percent but less than 40 percent impaired, limited or restricted Self Care Discharge Status [phone]): 0 percent impaired, limited or restricted OT General  Charges $OT Visit: 1 Visit OT Treatments $Self Care/Home Management : 8-22 mins    [name], OTR/L  [phone] 12/05/2016, 8:23 AM

## 2016-12-05 NOTE — Care Management Note (Signed)
Case Management Note  Patient Details  Name: Angel Price MRN: [id number] Date of Birth: 1965/09/11  Expected Discharge Date:  12/05/16               Expected Discharge Plan:  [place]  In-House Referral:  Clinical Social Work  Discharge planning Services  CM Consult  Post Acute Care Choice:  Home Health Choice offered to:  Patient  HH Arranged:  PT, OT [place] Agency:  [place]  Status of Service:  Completed, signed off  Additional Comments: Pt discharging home today with self care. Pt will be referred to [facility], pt aware HH has 48 hrs to make first visit. Vaughan Basta, [facility] rep, aware of referral and will obtain pt info from chart.   Sherald Barge, RN 12/05/2016, 11:48 AM

## 2016-12-05 NOTE — Care Management Important Message (Signed)
Important Message  Patient Details  Name: Angel Price MRN: [id number] Date of Birth: 12-28-1965   Medicare Important Message Given:  Yes    Sherald Barge, RN 12/05/2016, 11:52 AM

## 2016-12-06 DIAGNOSIS — D573 Sickle-cell trait: Secondary | ICD-10-CM | POA: Diagnosis not present

## 2016-12-06 DIAGNOSIS — K3184 Gastroparesis: Secondary | ICD-10-CM | POA: Diagnosis not present

## 2016-12-06 DIAGNOSIS — K219 Gastro-esophageal reflux disease without esophagitis: Secondary | ICD-10-CM | POA: Diagnosis not present

## 2016-12-06 DIAGNOSIS — I10 Essential (primary) hypertension: Secondary | ICD-10-CM | POA: Diagnosis not present

## 2016-12-06 DIAGNOSIS — M328 Other forms of systemic lupus erythematosus: Secondary | ICD-10-CM | POA: Diagnosis not present

## 2016-12-06 DIAGNOSIS — M6281 Muscle weakness (generalized): Secondary | ICD-10-CM | POA: Diagnosis not present

## 2016-12-06 DIAGNOSIS — E079 Disorder of thyroid, unspecified: Secondary | ICD-10-CM | POA: Diagnosis not present

## 2016-12-06 DIAGNOSIS — R69 Illness, unspecified: Secondary | ICD-10-CM | POA: Diagnosis not present

## 2016-12-10 DIAGNOSIS — D573 Sickle-cell trait: Secondary | ICD-10-CM | POA: Diagnosis not present

## 2016-12-10 DIAGNOSIS — L930 Discoid lupus erythematosus: Secondary | ICD-10-CM | POA: Diagnosis not present

## 2016-12-10 DIAGNOSIS — E039 Hypothyroidism, unspecified: Secondary | ICD-10-CM | POA: Diagnosis not present

## 2016-12-10 DIAGNOSIS — M6281 Muscle weakness (generalized): Secondary | ICD-10-CM | POA: Diagnosis not present

## 2016-12-10 DIAGNOSIS — M328 Other forms of systemic lupus erythematosus: Secondary | ICD-10-CM | POA: Diagnosis not present

## 2016-12-10 DIAGNOSIS — K3184 Gastroparesis: Secondary | ICD-10-CM | POA: Diagnosis not present

## 2016-12-10 DIAGNOSIS — K219 Gastro-esophageal reflux disease without esophagitis: Secondary | ICD-10-CM | POA: Diagnosis not present

## 2016-12-10 DIAGNOSIS — R69 Illness, unspecified: Secondary | ICD-10-CM | POA: Diagnosis not present

## 2016-12-10 DIAGNOSIS — E079 Disorder of thyroid, unspecified: Secondary | ICD-10-CM | POA: Diagnosis not present

## 2016-12-10 DIAGNOSIS — I10 Essential (primary) hypertension: Secondary | ICD-10-CM | POA: Diagnosis not present

## 2016-12-12 DIAGNOSIS — E079 Disorder of thyroid, unspecified: Secondary | ICD-10-CM | POA: Diagnosis not present

## 2016-12-12 DIAGNOSIS — R69 Illness, unspecified: Secondary | ICD-10-CM | POA: Diagnosis not present

## 2016-12-12 DIAGNOSIS — M6281 Muscle weakness (generalized): Secondary | ICD-10-CM | POA: Diagnosis not present

## 2016-12-12 DIAGNOSIS — D573 Sickle-cell trait: Secondary | ICD-10-CM | POA: Diagnosis not present

## 2016-12-12 DIAGNOSIS — K3184 Gastroparesis: Secondary | ICD-10-CM | POA: Diagnosis not present

## 2016-12-12 DIAGNOSIS — K219 Gastro-esophageal reflux disease without esophagitis: Secondary | ICD-10-CM | POA: Diagnosis not present

## 2016-12-12 DIAGNOSIS — I10 Essential (primary) hypertension: Secondary | ICD-10-CM | POA: Diagnosis not present

## 2016-12-12 DIAGNOSIS — M328 Other forms of systemic lupus erythematosus: Secondary | ICD-10-CM | POA: Diagnosis not present

## 2016-12-13 DIAGNOSIS — K219 Gastro-esophageal reflux disease without esophagitis: Secondary | ICD-10-CM | POA: Diagnosis not present

## 2016-12-13 DIAGNOSIS — E079 Disorder of thyroid, unspecified: Secondary | ICD-10-CM | POA: Diagnosis not present

## 2016-12-13 DIAGNOSIS — D573 Sickle-cell trait: Secondary | ICD-10-CM | POA: Diagnosis not present

## 2016-12-13 DIAGNOSIS — R69 Illness, unspecified: Secondary | ICD-10-CM | POA: Diagnosis not present

## 2016-12-13 DIAGNOSIS — M6281 Muscle weakness (generalized): Secondary | ICD-10-CM | POA: Diagnosis not present

## 2016-12-13 DIAGNOSIS — K3184 Gastroparesis: Secondary | ICD-10-CM | POA: Diagnosis not present

## 2016-12-13 DIAGNOSIS — M328 Other forms of systemic lupus erythematosus: Secondary | ICD-10-CM | POA: Diagnosis not present

## 2016-12-13 DIAGNOSIS — I10 Essential (primary) hypertension: Secondary | ICD-10-CM | POA: Diagnosis not present

## 2016-12-16 DIAGNOSIS — D573 Sickle-cell trait: Secondary | ICD-10-CM | POA: Diagnosis not present

## 2016-12-16 DIAGNOSIS — M6281 Muscle weakness (generalized): Secondary | ICD-10-CM | POA: Diagnosis not present

## 2016-12-16 DIAGNOSIS — I10 Essential (primary) hypertension: Secondary | ICD-10-CM | POA: Diagnosis not present

## 2016-12-16 DIAGNOSIS — R69 Illness, unspecified: Secondary | ICD-10-CM | POA: Diagnosis not present

## 2016-12-16 DIAGNOSIS — K219 Gastro-esophageal reflux disease without esophagitis: Secondary | ICD-10-CM | POA: Diagnosis not present

## 2016-12-16 DIAGNOSIS — E079 Disorder of thyroid, unspecified: Secondary | ICD-10-CM | POA: Diagnosis not present

## 2016-12-16 DIAGNOSIS — K3184 Gastroparesis: Secondary | ICD-10-CM | POA: Diagnosis not present

## 2016-12-16 DIAGNOSIS — M328 Other forms of systemic lupus erythematosus: Secondary | ICD-10-CM | POA: Diagnosis not present

## 2016-12-18 ENCOUNTER — Other Ambulatory Visit (HOSPITAL_BASED_OUTPATIENT_CLINIC_OR_DEPARTMENT_OTHER): Payer: Self-pay

## 2016-12-18 DIAGNOSIS — K219 Gastro-esophageal reflux disease without esophagitis: Secondary | ICD-10-CM | POA: Diagnosis not present

## 2016-12-18 DIAGNOSIS — M328 Other forms of systemic lupus erythematosus: Secondary | ICD-10-CM | POA: Diagnosis not present

## 2016-12-18 DIAGNOSIS — K3184 Gastroparesis: Secondary | ICD-10-CM | POA: Diagnosis not present

## 2016-12-18 DIAGNOSIS — G4733 Obstructive sleep apnea (adult) (pediatric): Secondary | ICD-10-CM

## 2016-12-18 DIAGNOSIS — I10 Essential (primary) hypertension: Secondary | ICD-10-CM | POA: Diagnosis not present

## 2016-12-18 DIAGNOSIS — D573 Sickle-cell trait: Secondary | ICD-10-CM | POA: Diagnosis not present

## 2016-12-18 DIAGNOSIS — M6281 Muscle weakness (generalized): Secondary | ICD-10-CM | POA: Diagnosis not present

## 2016-12-18 DIAGNOSIS — E079 Disorder of thyroid, unspecified: Secondary | ICD-10-CM | POA: Diagnosis not present

## 2016-12-18 DIAGNOSIS — R69 Illness, unspecified: Secondary | ICD-10-CM | POA: Diagnosis not present

## 2016-12-19 DIAGNOSIS — K210 Gastro-esophageal reflux disease with esophagitis: Secondary | ICD-10-CM | POA: Diagnosis not present

## 2016-12-19 DIAGNOSIS — I10 Essential (primary) hypertension: Secondary | ICD-10-CM | POA: Diagnosis not present

## 2016-12-19 DIAGNOSIS — E039 Hypothyroidism, unspecified: Secondary | ICD-10-CM | POA: Diagnosis not present

## 2016-12-19 DIAGNOSIS — L930 Discoid lupus erythematosus: Secondary | ICD-10-CM | POA: Diagnosis not present

## 2016-12-20 ENCOUNTER — Emergency Department (HOSPITAL_COMMUNITY): Payer: Medicare HMO

## 2016-12-20 ENCOUNTER — Emergency Department (HOSPITAL_COMMUNITY)
Admission: EM | Admit: 2016-12-20 | Discharge: 2016-12-20 | Disposition: A | Discharge status: Home / Self Care | Payer: Medicare HMO | Attending: Emergency Medicine | Admitting: Emergency Medicine

## 2016-12-20 ENCOUNTER — Encounter (HOSPITAL_COMMUNITY): Payer: Self-pay | Admitting: *Deleted

## 2016-12-20 DIAGNOSIS — Z7982 Long term (current) use of aspirin: Secondary | ICD-10-CM | POA: Insufficient documentation

## 2016-12-20 DIAGNOSIS — Z79899 Other long term (current) drug therapy: Secondary | ICD-10-CM | POA: Insufficient documentation

## 2016-12-20 DIAGNOSIS — R200 Anesthesia of skin: Secondary | ICD-10-CM | POA: Insufficient documentation

## 2016-12-20 DIAGNOSIS — Z8673 Personal history of transient ischemic attack (TIA), and cerebral infarction without residual deficits: Secondary | ICD-10-CM | POA: Diagnosis not present

## 2016-12-20 DIAGNOSIS — D573 Sickle-cell trait: Secondary | ICD-10-CM | POA: Diagnosis not present

## 2016-12-20 DIAGNOSIS — R531 Weakness: Secondary | ICD-10-CM | POA: Diagnosis not present

## 2016-12-20 DIAGNOSIS — F17210 Nicotine dependence, cigarettes, uncomplicated: Secondary | ICD-10-CM | POA: Diagnosis not present

## 2016-12-20 DIAGNOSIS — H547 Unspecified visual loss: Secondary | ICD-10-CM | POA: Diagnosis not present

## 2016-12-20 DIAGNOSIS — R2990 Unspecified symptoms and signs involving the nervous system: Secondary | ICD-10-CM | POA: Diagnosis present

## 2016-12-20 DIAGNOSIS — R69 Illness, unspecified: Secondary | ICD-10-CM | POA: Diagnosis not present

## 2016-12-20 DIAGNOSIS — I10 Essential (primary) hypertension: Secondary | ICD-10-CM | POA: Insufficient documentation

## 2016-12-20 DIAGNOSIS — M549 Dorsalgia, unspecified: Secondary | ICD-10-CM | POA: Insufficient documentation

## 2016-12-20 DIAGNOSIS — I639 Cerebral infarction, unspecified: Secondary | ICD-10-CM | POA: Diagnosis not present

## 2016-12-20 DIAGNOSIS — E039 Hypothyroidism, unspecified: Secondary | ICD-10-CM | POA: Diagnosis not present

## 2016-12-20 DIAGNOSIS — R4781 Slurred speech: Secondary | ICD-10-CM | POA: Diagnosis not present

## 2016-12-20 DIAGNOSIS — I700 Atherosclerosis of aorta: Secondary | ICD-10-CM | POA: Diagnosis not present

## 2016-12-20 LAB — DIFFERENTIAL
BASOS PCT: 0 %
Basophils Absolute: 0 10*3/uL (ref 0.0–0.1)
EOS ABS: 0.3 10*3/uL (ref 0.0–0.7)
EOS PCT: 3 %
LYMPHS ABS: 2.5 10*3/uL (ref 0.7–4.0)
Lymphocytes Relative: 26 %
Monocytes Absolute: 0.7 10*3/uL (ref 0.1–1.0)
Monocytes Relative: 7 %
NEUTROS PCT: 64 %
Neutro Abs: 6.3 10*3/uL (ref 1.7–7.7)

## 2016-12-20 LAB — PROTIME-INR
INR: 1.07
PROTHROMBIN TIME: 13.8 s (ref 11.4–15.2)

## 2016-12-20 LAB — ETHANOL

## 2016-12-20 LAB — COMPREHENSIVE METABOLIC PANEL
ALBUMIN: 3 g/dL — AB (ref 3.5–5.0)
ALT: 16 U/L (ref 14–54)
ANION GAP: 6 (ref 5–15)
AST: 21 U/L (ref 15–41)
Alkaline Phosphatase: 86 U/L (ref 38–126)
BILIRUBIN TOTAL: 0.3 mg/dL (ref 0.3–1.2)
BUN: 8 mg/dL (ref 6–20)
CO2: 25 mmol/L (ref 22–32)
Calcium: 8.7 mg/dL — ABNORMAL LOW (ref 8.9–10.3)
Chloride: 106 mmol/L (ref 101–111)
Creatinine, Ser: 0.54 mg/dL (ref 0.44–1.00)
GFR calc Af Amer: >60 mL/min (ref >60)
GLUCOSE: 96 mg/dL (ref 65–99)
POTASSIUM: 3.1 mmol/L — AB (ref 3.5–5.1)
Sodium: 137 mmol/L (ref 135–145)
TOTAL PROTEIN: 6.7 g/dL (ref 6.5–8.1)

## 2016-12-20 LAB — I-STAT CHEM 8, ED
BUN: 7 mg/dL (ref 6–20)
CREATININE: 0.6 mg/dL (ref 0.44–1.00)
Calcium, Ion: 1.13 mmol/L — ABNORMAL LOW (ref 1.15–1.40)
Chloride: 106 mmol/L (ref 101–111)
Glucose, Bld: 90 mg/dL (ref 65–99)
HEMATOCRIT: 27 % — AB (ref 36.0–46.0)
HEMOGLOBIN: 9.2 g/dL — AB (ref 12.0–15.0)
POTASSIUM: 3.7 mmol/L (ref 3.5–5.1)
SODIUM: 141 mmol/L (ref 135–145)
TCO2: 26 mmol/L (ref 22–32)

## 2016-12-20 LAB — RAPID URINE DRUG SCREEN, HOSP PERFORMED
Amphetamines: NOT DETECTED
Barbiturates: NOT DETECTED
Benzodiazepines: NOT DETECTED
COCAINE: NOT DETECTED
OPIATES: NOT DETECTED
Tetrahydrocannabinol: NOT DETECTED

## 2016-12-20 LAB — URINALYSIS, ROUTINE W REFLEX MICROSCOPIC
Bilirubin Urine: NEGATIVE
Glucose, UA: NEGATIVE mg/dL
Hgb urine dipstick: NEGATIVE
KETONES UR: NEGATIVE mg/dL
Nitrite: NEGATIVE
PROTEIN: NEGATIVE mg/dL
Specific Gravity, Urine: 1.016 (ref 1.005–1.030)
pH: 6 (ref 5.0–8.0)

## 2016-12-20 LAB — CBC
HCT: 27.1 % — ABNORMAL LOW (ref 36.0–46.0)
HEMOGLOBIN: 9.5 g/dL — AB (ref 12.0–15.0)
MCH: 27 pg (ref 26.0–34.0)
MCHC: 35.1 g/dL (ref 30.0–36.0)
MCV: 77 fL — ABNORMAL LOW (ref 78.0–100.0)
Platelets: 374 10*3/uL (ref 150–400)
RBC: 3.52 MIL/uL — ABNORMAL LOW (ref 3.87–5.11)
RDW: 16.1 % — AB (ref 11.5–15.5)
WBC: 9.8 10*3/uL (ref 4.0–10.5)

## 2016-12-20 LAB — I-STAT TROPONIN, ED: TROPONIN I, POC: 0 ng/mL (ref 0.00–0.08)

## 2016-12-20 LAB — APTT: aPTT: 31 seconds (ref 24–36)

## 2016-12-20 MED ORDER — KETOROLAC TROMETHAMINE 30 MG/ML IJ SOLN
30.0000 mg | Freq: Once | INTRAMUSCULAR | Status: AC
  Administered 2016-12-20: 30 mg via INTRAVENOUS
  Filled 2016-12-20: qty 1

## 2016-12-20 MED ORDER — IOPAMIDOL (ISOVUE-370) INJECTION 76%
75.0000 mL | Freq: Once | INTRAVENOUS | Status: AC | PRN
  Administered 2016-12-20: 75 mL via INTRAVENOUS

## 2016-12-20 MED ORDER — LORAZEPAM 2 MG/ML IJ SOLN
1.0000 mg | Freq: Once | INTRAMUSCULAR | Status: AC
  Administered 2016-12-20: 1 mg via INTRAVENOUS
  Filled 2016-12-20: qty 1

## 2016-12-20 NOTE — Discharge Instructions (Signed)
You were seen in the ED today with weakness and numbness. The MRI did not show a stroke and Dr. [name] would like to see you in the office for additional testing. Call today to schedule an appointment. Continue your physical therapy this week.   Return to the ED with any new or worsening symptoms.

## 2016-12-20 NOTE — ED Notes (Signed)
Patient has been admitted x 2 the last 6 weeks for same symptoms

## 2016-12-20 NOTE — ED Provider Notes (Signed)
Emergency Department Provider Note   I have reviewed the triage vital signs and the nursing notes.   HISTORY  Chief Complaint Weakness   HPI Angel Price is a 51 y.o. female with PMH of depression, HTN, HLD, and multiple presentations for left side weakness with negative MRI presents to the emergency department for evaluation of sudden onset left sided weakness, numbness, vision loss, and slurred speech. Patient was last seen normal at 11 PM last night before going to bed. She woke up at 5:30 this morning describes symptoms. 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 She denies any sudden severe headache. No nausea or vomiting. No changes to medications. She is having some back pain which she describes as constant and moderate.    Past Medical History:  Diagnosis Date  . Abscess    soft tissue  . Adrenal mass ([place])   . Alcohol abuse   . Anxiety   . Blood transfusion without reported diagnosis   . Chronic abdominal pain   . Chronic wound infection of abdomen   . Colon polyp    colonoscopy 04/2014  . Depression   . Diverticulosis    colonoscopy 04/2014 moderat pan colonic  . Gastritis    EGD 05/2014  . Gastroparesis Nov 2015  . GERD (gastroesophageal reflux disease)   . Hemorrhoid    internal large  . Hiatal hernia   . History of Billroth II operation   . Hypertension   . Lung nodule    CT 02/2014 needs repeat 1 month  . Lung nodule < 6cm on CT 04/25/2014  . Lupus   . Nausea and vomiting    chronic, recurrent  . Schatzki's ring    patent per EGD 04/2014  . Sickle cell trait ([place])   . Suicide attempt ([place])   . 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 Acute left hemiparesis ([place]) 12/05/2016  . Malingering 12/05/2016  . Weakness of left lower extremity 12/02/2016  . CVA (cerebral vascular accident) ([place])  11/23/2016  . Bright red rectal bleeding 08/22/2016  . Hypotension due to blood loss   . Acute GI bleeding 05/24/2016  . Dieulafoy lesion of duodenum   . History of Billroth II operation   . Gastrointestinal hemorrhage 05/22/2016  . Absolute anemia   . Pancreatitis, acute   . Acute pancreatitis 10/01/2015  . Abnormal CT scan of lung 10/01/2015  . Alcohol intoxication ([place])   . Left-sided weakness   . Psychosomatic factor in physical condition   . Upper GI bleed   . Diverticulosis of colon with hemorrhage   . Chronic wound infection of abdomen 12/22/2014  . Thyroid disease 12/22/2014  . Hypertension 12/22/2014  . Ataxia 11/01/2014  . Hemorrhoids 04/26/2014  . Lung nodule 04/26/2014  . Diverticulosis   . Gastritis   . Hiatal hernia   . Schatzki's ring   . Acute blood loss anemia 04/25/2014  . Sinus tachycardia 04/25/2014  . Hypokalemia 04/25/2014  . Hyponatremia 04/25/2014  . Hematemesis with nausea 04/25/2014  . Lung nodule < 6cm on CT 04/25/2014  . Intractable nausea and vomiting 04/24/2014  . Gastroparesis   . Abdominal pain 04/01/2014  . Chronic abdominal pain 01/21/2014  . Gastroenteritis 12/10/2013  . Chronic abdominal wound infection 08/16/2013  . MDD (major depressive disorder), recurrent episode, severe ([place]) 06/27/2013  .  Wrist laceration 06/24/2013  . Nausea with vomiting 05/07/2013  . Diarrhea 05/07/2013  . Rectal bleeding 05/07/2013  . Abnormal LFTs 05/07/2013  . Adrenal mass, left ([place]) 05/07/2013  . Abdominal wall abscess at site of surgical wound 04/19/2013  . Abdominal wall abscess 04/18/2013  . Frequent headaches 03/30/2013  . Sleep difficulties 03/30/2013  . Essential hypertension, benign 03/30/2013  . History of cocaine abuse 03/16/2013  . History of schizoaffective disorder 03/16/2013  . Bipolar disorder ([place]) 03/16/2013  . Personality disorder ([place]) 03/16/2013  . Tobacco abuse 03/16/2013  . Alcohol abuse 03/16/2013  . Palpitations 03/16/2013   . Poor vision 03/16/2013  . History of gastric bypass 03/16/2013  . Status post hysterectomy with oophorectomy 03/16/2013  . Hypothyroid 03/16/2013  . Lupus ([place]) 03/16/2013    Past Surgical History:  Procedure Laterality Date  . ABDOMINAL HYSTERECTOMY  2013   [place]  . ABDOMINAL SURGERY    . ADRENALECTOMY Right   . AGILE CAPSULE N/A 01/05/2015   Procedure: AGILE CAPSULE;  Surgeon: Daneil Dolin, MD;  Location[facility];  Service: Endoscopy;  Laterality: N/A;  0700  . Billroth II procedure      [place], first 2000, 2005/2006.  Marland Kitchen BIOPSY  05/20/2013   Procedure: BIOPSIES OF ASCENDING AND SIGMOID COLON;  Surgeon: Daneil Dolin, MD;  Location: [facility];  Service: Endoscopy;;  . BIOPSY  04/26/2014   Procedure: BIOPSIES;  Surgeon: Danie Binder, MD;  Location: [facility];  Service: Endoscopy;;  . CHOLECYSTECTOMY    . COLONOSCOPY     in [place]  . COLONOSCOPY WITH PROPOFOL N/A 05/20/2013   Dr.Rourk- inadequate prep, normal appearing rectum, grossly normal colon aside from pancolonic diverticula, normal terminal ileum bx= unremarkable colonic mucosa. Due for early interval 2016.   Marland Kitchen COLONOSCOPY WITH PROPOFOL N/A 04/26/2014   WGY:KZLDJT ileum/one colon polyp removed/moderate pan-colonic diverticulosis/large internal hemorrhoids  . COLONOSCOPY WITH PROPOFOL N/A 12/23/2014   Dr.Rourk- minimal internal hemorrhoids, pancolonic diverticulosis  . COLONOSCOPY WITH PROPOFOL N/A 08/23/2016   Procedure: COLONOSCOPY WITH PROPOFOL;  Surgeon: Danie Binder, MD;  [facility];  Service: Endoscopy;  Laterality: N/A;  . DEBRIDEMENT OF ABDOMINAL WALL ABSCESS N/A 02/08/2013   Procedure: DEBRIDEMENT OF ABDOMINAL WALL ABSCESS;  Surgeon: Jamesetta So, MD;  Location: [facility];  Service: General;  Laterality: N/A;  . ESOPHAGOGASTRODUODENOSCOPY (EGD) WITH PROPOFOL N/A 05/20/2013   Dr.Rourk- s/p prior gastric surgery with normal esophagus, residual gastric mucosa and patent efferent limb  .  ESOPHAGOGASTRODUODENOSCOPY (EGD) WITH PROPOFOL N/A 02/03/2014   Dr. Gala Romney:  s/p hemigastrectomy with retained gastric contents. Residual gastric mucosa and efferent limb appeared normal otherwise. Query gastroparesis.   Marland Kitchen ESOPHAGOGASTRODUODENOSCOPY (EGD) WITH PROPOFOL N/A 04/26/2014   TSV:[id number]'S ring/small HH/mild non-erosive gasrtitis/normal anastomosis  . ESOPHAGOGASTRODUODENOSCOPY (EGD) WITH PROPOFOL N/A 12/23/2014   Dr.Rourk- s/p prior hemigastrctomy, active oozing from anastomotic suture site, hemostasis achieved  . ESOPHAGOGASTRODUODENOSCOPY (EGD) WITH PROPOFOL N/A 05/23/2016   Dr. 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 tendon repar Right    wrist  . WOUND EXPLORATION Right 06/24/2013   Procedure: exploration of traumatic wound right wrist;  Surgeon: Tennis Must, MD;  Location: [facility];  Service: Orthopedics;  Laterality: Right;    Current Outpatient Rx  . Order #: [id number] Class: Historical Med  . Order #: [id number] Class: Normal  . Order #: [id number] Class: Normal  . Order #: [id number] Class: Print  .  Order #: [id number] Class: Historical Med  . Order #: [id number] Class: Historical Med  . Order #: [id number] Class: Normal  . Order #: [id number] Class: Normal    Allergies Codeine; Morphine and related; and Reglan [metoclopramide]  Family History  Problem Relation Age of Onset  . Brain cancer Son   . Schizophrenia Son   . Cancer Son        brain  . Lung cancer Father   . Cancer Father        mets  . Drug abuse Mother   . Breast cancer Maternal Aunt   . Bipolar disorder Maternal Aunt   . Drug abuse Maternal Aunt   . Colon cancer Maternal Grandmother        late 76s, early 50s  . Drug abuse Sister   . Drug abuse Brother   . Bipolar disorder Paternal Grandfather   . Bipolar disorder Cousin   . Liver disease Neg Hx     Social History Social History  Substance Use Topics  .  Smoking status: Current Every Day Smoker    Packs/day: 0.50    Years: 35.00    Types: Cigarettes  . Smokeless tobacco: Never Used  . Alcohol use No    Review of Systems  Constitutional: No fever/chills Eyes: No visual changes. ENT: No sore throat. Cardiovascular: Denies chest pain. Respiratory: Denies shortness of breath. Gastrointestinal: No abdominal pain.  No nausea, no vomiting.  No diarrhea.  No constipation. Genitourinary: Negative for dysuria. Musculoskeletal: Negative for back pain. Skin: Negative for rash. Neurological: Negative for headaches. Positive left face, arm, and leg weakness and numbness.   10-point ROS otherwise negative.  ____________________________________________   PHYSICAL EXAM:  VITAL SIGNS: Vitals:   12/20/16 1230 12/20/16 1257  BP: (!) 176/105   Pulse: 60   Resp: 20   Temp:  98.6 F (37 C)  SpO2: 96%     Constitutional: Alert and oriented. No acute distress.  Eyes: Conjunctivae are normal. PERRL. EOMI. Decreased vision from left eye with visual field testing.  Head: Atraumatic. Nose: No congestion/rhinnorhea. Mouth/Throat: Mucous membranes are moist. Neck: No stridor.  Cardiovascular: Normal rate, regular rhythm. Good peripheral circulation. Grossly normal heart sounds.   Respiratory: Normal respiratory effort.  No retractions. Lungs CTAB. Gastrointestinal: Soft and nontender. No distention.  Musculoskeletal: No lower extremity tenderness nor edema. No gross deformities of extremities. Neurologic: Slight slurred speech. Left face asymmetry with smiling. 2/5 strength in the LUE and LLE with decreased sensation. No hemi-neglect.  Skin:  Skin is warm, dry and intact. No rash noted.  ____________________________________________   LABS (all labs ordered are listed, but only abnormal results are displayed)  Labs Reviewed  CBC - Abnormal; Notable for the following:       Result Value   RBC 3.52 (*)    Hemoglobin 9.5 (*)    HCT 27.1  (*)    MCV 77.0 (*)    RDW 16.1 (*)    All other components within normal limits  COMPREHENSIVE METABOLIC PANEL - Abnormal; Notable for the following:    Potassium 3.1 (*)    Calcium 8.7 (*)    Albumin 3.0 (*)    All other components within normal limits  URINALYSIS, ROUTINE W REFLEX MICROSCOPIC - Abnormal; Notable for the following:    Leukocytes, UA MODERATE (*)    Bacteria, UA RARE (*)    Squamous Epithelial / LPF 0-5 (*)    All other components within normal limits  I-STAT CHEM 8, ED - Abnormal; Notable  for the following:    Calcium, Ion 1.13 (*)    Hemoglobin 9.2 (*)    HCT 27.0 (*)    All other components within normal limits  ETHANOL  PROTIME-INR  APTT  DIFFERENTIAL  RAPID URINE DRUG SCREEN, HOSP PERFORMED  I-STAT TROPONIN, ED  I-STAT BETA HCG BLOOD, ED ([facility], [facility], [facility] ONLY)   ____________________________________________  EKG   EKG Interpretation  Date/Time:  Friday December 20 2016 11:32:07 EDT Ventricular Rate:  61 PR Interval:    QRS Duration: 85 QT Interval:  460 QTC Calculation: 464 R Axis:   64 Text Interpretation:  Sinus rhythm Borderline T abnormalities, anterior leads No STEMI.  Confirmed by Nanda Quinton [phone]) on 12/20/2016 11:53:25 AM       ____________________________________________  RADIOLOGY  Ct Angio Head W Or Wo Contrast  Result Date: 12/20/2016 CLINICAL DATA:  Left-sided weakness.  Follow-up stroke. EXAM: CT ANGIOGRAPHY HEAD AND NECK TECHNIQUE: Multidetector CT imaging of the head and neck was performed using the standard protocol during bolus administration of intravenous contrast. Multiplanar CT image reconstructions and MIPs were obtained to evaluate the vascular anatomy. Carotid stenosis measurements (when applicable) are obtained utilizing NASCET criteria, using the distal internal carotid diameter as the denominator. CONTRAST:  75 cc Isovue 370 COMPARISON:  MRI 12/04/2016 FINDINGS: CT HEAD FINDINGS Brain: No evidence of malformation,  atrophy, old or acute small or large vessel infarction, mass lesion, hemorrhage, hydrocephalus or extra-axial collection. No evidence of pituitary lesion. Vascular: No vascular calcification.  No hyperdense vessels. Skull: Normal.  No fracture or focal bone lesion. Sinuses/Orbits: Visualized sinuses are clear. No fluid in the middle ears or mastoids. Visualized orbits are normal. Other: None significant CTA NECK FINDINGS Aortic arch: Minimal atherosclerotic calcification at the arch. No aneurysm or dissection. Branching pattern of the brachiocephalic vessels is normal without stenosis. Right carotid system: Common carotid artery widely patent to the bifurcation. Carotid bifurcation is normal without atherosclerotic plaque or irregularity. Cervical internal carotid artery widely patent. Left carotid system: Common carotid artery widely patent to the bifurcation. Carotid bifurcation is normal without atherosclerotic plaque or irregularity. Cervical internal carotid artery is widely patent. Vertebral arteries: Both vertebral artery origins are widely patent. The vertebral arteries are approximately equal in size an widely patent through the cervical region. Skeleton: Spondylosis C5-6. Other neck: No mass or lymphadenopathy. Upper chest: No abnormality seen on the left. On the right, there is volume loss in the upper lobe, similar to the chest radiograph of 10/21/2016. Follow-up to clearing suggested. Review of the MIP images confirms the above findings CTA HEAD FINDINGS Anterior circulation: Both internal carotid arteries are patent through the skullbase and siphon regions. The anterior and middle cerebral vessels are patent without proximal stenosis, aneurysm or vascular malformation. Posterior circulation: Both vertebral arteries are widely patent to the basilar. No basilar stenosis. Posterior circulation branch vessels are normal. Venous sinuses: Patent and normal. Anatomic variants: None Delayed phase: No abnormal  enhancement Review of the MIP images confirms the above findings IMPRESSION: Minimal atherosclerosis of the aortic arch. Normal appearance of the brachiocephalic and intracranial vessels. No atherosclerotic plaque or regularity. Normal appearance of the brain. Focal density in the right upper lobe presumed to represent persistent atelectasis. Follow-up to clearing suggested subsequently. Electronically Signed   By: Nelson Chimes M.D.   On: 12/20/2016 12:35   Ct Angio Neck W Or Wo Contrast  Result Date: 12/20/2016 CLINICAL DATA:  Left-sided weakness.  Follow-up stroke. EXAM: CT ANGIOGRAPHY HEAD AND NECK TECHNIQUE: Multidetector  CT imaging of the head and neck was performed using the standard protocol during bolus administration of intravenous contrast. Multiplanar CT image reconstructions and MIPs were obtained to evaluate the vascular anatomy. Carotid stenosis measurements (when applicable) are obtained utilizing NASCET criteria, using the distal internal carotid diameter as the denominator. CONTRAST:  75 cc Isovue 370 COMPARISON:  MRI 12/04/2016 FINDINGS: CT HEAD FINDINGS Brain: No evidence of malformation, atrophy, old or acute small or large vessel infarction, mass lesion, hemorrhage, hydrocephalus or extra-axial collection. No evidence of pituitary lesion. Vascular: No vascular calcification.  No hyperdense vessels. Skull: Normal.  No fracture or focal bone lesion. Sinuses/Orbits: Visualized sinuses are clear. No fluid in the middle ears or mastoids. Visualized orbits are normal. Other: None significant CTA NECK FINDINGS Aortic arch: Minimal atherosclerotic calcification at the arch. No aneurysm or dissection. Branching pattern of the brachiocephalic vessels is normal without stenosis. Right carotid system: Common carotid artery widely patent to the bifurcation. Carotid bifurcation is normal without atherosclerotic plaque or irregularity. Cervical internal carotid artery widely patent. Left carotid system:  Common carotid artery widely patent to the bifurcation. Carotid bifurcation is normal without atherosclerotic plaque or irregularity. Cervical internal carotid artery is widely patent. Vertebral arteries: Both vertebral artery origins are widely patent. The vertebral arteries are approximately equal in size an widely patent through the cervical region. Skeleton: Spondylosis C5-6. Other neck: No mass or lymphadenopathy. Upper chest: No abnormality seen on the left. On the right, there is volume loss in the upper lobe, similar to the chest radiograph of 10/21/2016. Follow-up to clearing suggested. Review of the MIP images confirms the above findings CTA HEAD FINDINGS Anterior circulation: Both internal carotid arteries are patent through the skullbase and siphon regions. The anterior and middle cerebral vessels are patent without proximal stenosis, aneurysm or vascular malformation. Posterior circulation: Both vertebral arteries are widely patent to the basilar. No basilar stenosis. Posterior circulation branch vessels are normal. Venous sinuses: Patent and normal. Anatomic variants: None Delayed phase: No abnormal enhancement Review of the MIP images confirms the above findings IMPRESSION: Minimal atherosclerosis of the aortic arch. Normal appearance of the brachiocephalic and intracranial vessels. No atherosclerotic plaque or regularity. Normal appearance of the brain. Focal density in the right upper lobe presumed to represent persistent atelectasis. Follow-up to clearing suggested subsequently. Electronically Signed   By: Nelson Chimes M.D.   On: 12/20/2016 12:35   Mr Brain Wo Contrast  Result Date: 12/20/2016 CLINICAL DATA:  Left lower extremity weakness for 2 months EXAM: MRI HEAD WITHOUT CONTRAST TECHNIQUE: Multiplanar, multiecho pulse sequences of the brain and surrounding structures were obtained without intravenous contrast. COMPARISON:  12/03/2016 FINDINGS: Brain: No acute infarction, hemorrhage,  hydrocephalus, extra-axial collection or mass lesion. Partially empty sella, chronic and incidental in this setting. Vascular: Major flow voids are preserved. Skull and upper cervical spine: Negative for marrow lesion. Sinuses/Orbits: Negative IMPRESSION: Stable and negative brain MRI. Electronically Signed   By: Monte Fantasia M.D.   On: 12/20/2016 14:10    ____________________________________________   PROCEDURES  Procedure(s) performed:   Procedures  CRITICAL CARE Performed by: Margette Fast Total critical care time: 35 minutes Critical care time was exclusive of separately billable procedures and treating other patients. Critical care was necessary to treat or prevent imminent or life-threatening deterioration. Critical care was time spent personally by me on the following activities: development of treatment plan with patient and/or surrogate as well as nursing, discussions with consultants, evaluation of patient's response to treatment, examination of patient,  obtaining history from patient or surrogate, ordering and performing treatments and interventions, ordering and review of laboratory studies, ordering and review of radiographic studies, pulse oximetry and re-evaluation of patient's condition.  Nanda Quinton, MD Emergency Medicine  ____________________________________________   INITIAL IMPRESSION / ASSESSMENT AND PLAN / ED COURSE  Pertinent labs & imaging results that were available during my care of the patient were reviewed by me and considered in my medical decision making (see chart for details).  Patient resents to the emergency department for evaluation of acute onset stroke symptoms. Rest known normal was 11 PM yesterday. She was outside of tPA/Code Stroke window but is VAN positive. Plan for STAT CT, labs, and discussion with [facility]rology at [facility] regarding other intervention.   11:39 AM Spoke with Dr. Malen Gauze. No code stroke. Had a similar symptoms in September with no  infarct on MRI. No CODE STROKE. Plan for CT head followed by CTA. No transport to [facility] unless LVO on CT.   02:00 PM Patient imaging is negative to this point. MRI pending. Patient has now had 3 MRI scans in the last 6 weeks. The discharge summary from last hospitalization describes ambulating in the room followed by exams with left hemiparesis. Strong suspicion at that time for malingering. Will discuss with Dr. [name] and follow up on MRI.   02:28 PM Spoke with Neurology Dr. [name] who recommends possible outpatient EMG which he can arrange as an outpatient. Patient is currently in PT at home so will continue. Discussed results and plan with patient and husband at bedside who feel they can manage at home with PT (next appt on Thursday) and outpatient Neurology follow up. Patient and husband are comfortable with the plan at discharge.   At this time, I do not feel there is any life-threatening condition present. I have reviewed and discussed all results (EKG, imaging, lab, urine as appropriate), exam findings with patient. I have reviewed nursing notes and appropriate previous records.  I feel the patient is safe to be discharged home without further emergent workup. Discussed usual and customary return precautions. Patient and family (if present) verbalize understanding and are comfortable with this plan.  Patient will follow-up with their primary care provider. If they do not have a primary care provider, information for follow-up has been provided to them. All questions have been answered.  ____________________________________________  FINAL CLINICAL IMPRESSION(S) / ED DIAGNOSES  Final diagnoses:  Stroke-like symptoms     MEDICATIONS GIVEN DURING THIS VISIT:  Medications  iopamidol (ISOVUE-370) 76 % injection 75 mL (75 mLs Intravenous Contrast Given 12/20/16 1205)  LORazepam (ATIVAN) injection 1 mg (1 mg Intravenous Given 12/20/16 1327)  ketorolac (TORADOL) 30 MG/ML injection 30 mg (30  mg Intravenous Given 12/20/16 1327)     NEW OUTPATIENT MEDICATIONS STARTED DURING THIS VISIT:  None   Note:  This document was prepared using Dragon voice recognition software and may include unintentional dictation errors.  Nanda Quinton, MD Emergency Medicine    Slyvester Latona, [facility], MD 12/20/16 [phone]

## 2016-12-21 DIAGNOSIS — R569 Unspecified convulsions: Secondary | ICD-10-CM | POA: Diagnosis not present

## 2016-12-22 ENCOUNTER — Encounter (HOSPITAL_COMMUNITY): Payer: Self-pay | Admitting: *Deleted

## 2016-12-22 ENCOUNTER — Emergency Department (HOSPITAL_COMMUNITY): Payer: Medicare HMO

## 2016-12-22 ENCOUNTER — Emergency Department (HOSPITAL_COMMUNITY)
Admission: EM | Admit: 2016-12-22 | Discharge: 2016-12-22 | Disposition: A | Discharge status: Home / Self Care | Payer: Medicare HMO | Attending: Emergency Medicine | Admitting: Emergency Medicine

## 2016-12-22 DIAGNOSIS — M549 Dorsalgia, unspecified: Secondary | ICD-10-CM | POA: Diagnosis not present

## 2016-12-22 DIAGNOSIS — Z79899 Other long term (current) drug therapy: Secondary | ICD-10-CM | POA: Insufficient documentation

## 2016-12-22 DIAGNOSIS — R4182 Altered mental status, unspecified: Secondary | ICD-10-CM | POA: Diagnosis not present

## 2016-12-22 DIAGNOSIS — Y906 Blood alcohol level of 120-199 mg/100 ml: Secondary | ICD-10-CM | POA: Diagnosis not present

## 2016-12-22 DIAGNOSIS — Z8673 Personal history of transient ischemic attack (TIA), and cerebral infarction without residual deficits: Secondary | ICD-10-CM | POA: Diagnosis not present

## 2016-12-22 DIAGNOSIS — F10920 Alcohol use, unspecified with intoxication, uncomplicated: Secondary | ICD-10-CM | POA: Diagnosis not present

## 2016-12-22 DIAGNOSIS — I10 Essential (primary) hypertension: Secondary | ICD-10-CM | POA: Diagnosis not present

## 2016-12-22 DIAGNOSIS — F17210 Nicotine dependence, cigarettes, uncomplicated: Secondary | ICD-10-CM | POA: Diagnosis not present

## 2016-12-22 DIAGNOSIS — M545 Low back pain: Secondary | ICD-10-CM | POA: Diagnosis not present

## 2016-12-22 DIAGNOSIS — E876 Hypokalemia: Secondary | ICD-10-CM | POA: Diagnosis not present

## 2016-12-22 DIAGNOSIS — M546 Pain in thoracic spine: Secondary | ICD-10-CM | POA: Diagnosis not present

## 2016-12-22 DIAGNOSIS — R69 Illness, unspecified: Secondary | ICD-10-CM | POA: Diagnosis not present

## 2016-12-22 DIAGNOSIS — R569 Unspecified convulsions: Secondary | ICD-10-CM | POA: Diagnosis not present

## 2016-12-22 DIAGNOSIS — Z7982 Long term (current) use of aspirin: Secondary | ICD-10-CM | POA: Diagnosis not present

## 2016-12-22 LAB — COMPREHENSIVE METABOLIC PANEL
ALK PHOS: 93 U/L (ref 38–126)
ALT: 18 U/L (ref 14–54)
ANION GAP: 9 (ref 5–15)
AST: 23 U/L (ref 15–41)
Albumin: 3.1 g/dL — ABNORMAL LOW (ref 3.5–5.0)
BUN: 10 mg/dL (ref 6–20)
CALCIUM: 8.4 mg/dL — AB (ref 8.9–10.3)
CO2: 24 mmol/L (ref 22–32)
Chloride: 109 mmol/L (ref 101–111)
Creatinine, Ser: 0.47 mg/dL (ref 0.44–1.00)
GFR calc non Af Amer: >60 mL/min (ref >60)
Glucose, Bld: 84 mg/dL (ref 65–99)
POTASSIUM: 2.5 mmol/L — AB (ref 3.5–5.1)
SODIUM: 142 mmol/L (ref 135–145)
TOTAL PROTEIN: 6.8 g/dL (ref 6.5–8.1)
Total Bilirubin: 0.1 mg/dL — ABNORMAL LOW (ref 0.3–1.2)

## 2016-12-22 LAB — RAPID URINE DRUG SCREEN, HOSP PERFORMED
Amphetamines: NOT DETECTED
BARBITURATES: NOT DETECTED
Benzodiazepines: NOT DETECTED
Cocaine: NOT DETECTED
OPIATES: NOT DETECTED
TETRAHYDROCANNABINOL: NOT DETECTED

## 2016-12-22 LAB — CBC WITH DIFFERENTIAL/PLATELET
BASOS PCT: 0 %
Basophils Absolute: 0 10*3/uL (ref 0.0–0.1)
EOS ABS: 0.2 10*3/uL (ref 0.0–0.7)
Eosinophils Relative: 3 %
HCT: 26.6 % — ABNORMAL LOW (ref 36.0–46.0)
HEMOGLOBIN: 9.3 g/dL — AB (ref 12.0–15.0)
Lymphocytes Relative: 39 %
Lymphs Abs: 3 10*3/uL (ref 0.7–4.0)
MCH: 27 pg (ref 26.0–34.0)
MCHC: 35 g/dL (ref 30.0–36.0)
MCV: 77.1 fL — ABNORMAL LOW (ref 78.0–100.0)
Monocytes Absolute: 0.4 10*3/uL (ref 0.1–1.0)
Monocytes Relative: 6 %
NEUTROS PCT: 52 %
Neutro Abs: 4 10*3/uL (ref 1.7–7.7)
PLATELETS: 372 10*3/uL (ref 150–400)
RBC: 3.45 MIL/uL — AB (ref 3.87–5.11)
RDW: 16.5 % — ABNORMAL HIGH (ref 11.5–15.5)
WBC: 7.6 10*3/uL (ref 4.0–10.5)

## 2016-12-22 LAB — URINALYSIS, ROUTINE W REFLEX MICROSCOPIC
BILIRUBIN URINE: NEGATIVE
GLUCOSE, UA: NEGATIVE mg/dL
HGB URINE DIPSTICK: NEGATIVE
Ketones, ur: NEGATIVE mg/dL
NITRITE: NEGATIVE
Protein, ur: NEGATIVE mg/dL
SPECIFIC GRAVITY, URINE: 1.009 (ref 1.005–1.030)
pH: 6 (ref 5.0–8.0)

## 2016-12-22 LAB — ETHANOL: ALCOHOL ETHYL (B): 188 mg/dL — AB (ref <10)

## 2016-12-22 LAB — MAGNESIUM: MAGNESIUM: 1.6 mg/dL — AB (ref 1.7–2.4)

## 2016-12-22 LAB — TROPONIN I: Troponin I: <0.03 ng/mL (ref <0.03)

## 2016-12-22 MED ORDER — POTASSIUM CHLORIDE 10 MEQ/100ML IV SOLN
10.0000 meq | INTRAVENOUS | Status: AC
  Administered 2016-12-22 (×3): 10 meq via INTRAVENOUS
  Filled 2016-12-22 (×4): qty 100

## 2016-12-22 MED ORDER — POTASSIUM CHLORIDE ER 10 MEQ PO TBCR: 20.0000 meq | EXTENDED_RELEASE_TABLET | Freq: Two times a day (BID) | ORAL | 0 refills | Status: DC

## 2016-12-22 MED ORDER — MAGNESIUM SULFATE 2 GM/50ML IV SOLN
2.0000 g | Freq: Once | INTRAVENOUS | Status: AC
  Administered 2016-12-22: 2 g via INTRAVENOUS
  Filled 2016-12-22: qty 50

## 2016-12-22 NOTE — ED Provider Notes (Signed)
[facility] DEPT Provider Note   CSN: [id number] Arrival date & time: 12/22/16  0008  Time seen 01:07 AM   History   Chief Complaint Chief Complaint  Patient presents with  . Seizures   Level V caveat for altered mental status  HPI Angel Price is a 51 y.o. female.  HPI  patient presents emergency department via EMS. They report somebody called because patient had some type of uncontrolled movements. They state the patient was agitated and they gave her Versed. On arrival patient ignores talking to me. When I finally stopped trying to examine her and at the computer pretty orders she then states clearly "she said she was going to bring me a blanket". And then she told me she was at her mom's house when I told her she was at the hospital. Patient then refused to answer anymore questions.  PCP Rosita Fire, MD   Past Medical History:  Diagnosis Date  . Abscess    soft tissue  . Adrenal mass ([place])   . Alcohol abuse   . Anxiety   . Blood transfusion without reported diagnosis   . Chronic abdominal pain   . Chronic wound infection of abdomen   . Colon polyp    colonoscopy 04/2014  . Depression   . Diverticulosis    colonoscopy 04/2014 moderat pan colonic  . Gastritis    EGD 05/2014  . Gastroparesis Nov 2015  . GERD (gastroesophageal reflux disease)   . Hemorrhoid    internal large  . Hiatal hernia   . History of Billroth II operation   . Hypertension   . Lung nodule    CT 02/2014 needs repeat 1 month  . Lung nodule < 6cm on CT 04/25/2014  . Lupus   . Nausea and vomiting    chronic, recurrent  . Schatzki's ring    patent per EGD 04/2014  . Sickle cell trait ([place])   . Suicide attempt ([place])   . 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 Acute left hemiparesis ([place]) 12/05/2016  . Malingering 12/05/2016  . Weakness of left lower extremity 12/02/2016  . CVA (cerebral vascular accident) ([place])  11/23/2016  . Bright red rectal bleeding 08/22/2016  . Hypotension due to blood loss   . Acute GI bleeding 05/24/2016  . Dieulafoy lesion of duodenum   . History of Billroth II operation   . Gastrointestinal hemorrhage 05/22/2016  . Absolute anemia   . Pancreatitis, acute   . Acute pancreatitis 10/01/2015  . Abnormal CT scan of lung 10/01/2015  . Alcohol intoxication ([place])   . Left-sided weakness   . Psychosomatic factor in physical condition   . Upper GI bleed   . Diverticulosis of colon with hemorrhage   . Chronic wound infection of abdomen 12/22/2014  . Thyroid disease 12/22/2014  . Hypertension 12/22/2014  . Ataxia 11/01/2014  . Hemorrhoids 04/26/2014  . Lung nodule 04/26/2014  . Diverticulosis   . Gastritis   . Hiatal hernia   . Schatzki's ring   . Acute blood loss anemia 04/25/2014  . Sinus tachycardia 04/25/2014  . Hypokalemia 04/25/2014  . Hyponatremia 04/25/2014  . Hematemesis with nausea 04/25/2014  . Lung nodule < 6cm on CT 04/25/2014  . Intractable nausea and vomiting 04/24/2014  . Gastroparesis   . Abdominal pain 04/01/2014  . Chronic abdominal pain 01/21/2014  . Gastroenteritis 12/10/2013  . Chronic abdominal wound infection 08/16/2013  .  MDD (major depressive disorder), recurrent episode, severe ([place]) 06/27/2013  . Wrist laceration 06/24/2013  . Nausea with vomiting 05/07/2013  . Diarrhea 05/07/2013  . Rectal bleeding 05/07/2013  . Abnormal LFTs 05/07/2013  . Adrenal mass, left ([place]) 05/07/2013  . Abdominal wall abscess at site of surgical wound 04/19/2013  . Abdominal wall abscess 04/18/2013  . Frequent headaches 03/30/2013  . Sleep difficulties 03/30/2013  . Essential hypertension, benign 03/30/2013  . History of cocaine abuse 03/16/2013  . History of schizoaffective disorder 03/16/2013  . Bipolar disorder ([place]) 03/16/2013  . Personality disorder ([place]) 03/16/2013  . Tobacco abuse 03/16/2013  . Alcohol abuse 03/16/2013  . Palpitations 03/16/2013   . Poor vision 03/16/2013  . History of gastric bypass 03/16/2013  . Status post hysterectomy with oophorectomy 03/16/2013  . Hypothyroid 03/16/2013  . Lupus ([place]) 03/16/2013    Past Surgical History:  Procedure Laterality Date  . ABDOMINAL HYSTERECTOMY  2013   [place]  . ABDOMINAL SURGERY    . ADRENALECTOMY Right   . AGILE CAPSULE N/A 01/05/2015   Procedure: AGILE CAPSULE;  Surgeon: Daneil Dolin, MD;  Location: [facility];  Service: Endoscopy;  Laterality: N/A;  0700  . Billroth II procedure      [place], first 2000, 2005/2006.  Marland Kitchen BIOPSY  05/20/2013   Procedure: BIOPSIES OF ASCENDING AND SIGMOID COLON;  Surgeon: Daneil Dolin, MD;  Location: [facility] ORS;  Service: Endoscopy;;  . BIOPSY  04/26/2014   Procedure: BIOPSIES;  Surgeon: Danie Binder, MD;  Location: [facility] ORS;  Service: Endoscopy;;  . CHOLECYSTECTOMY    . COLONOSCOPY     in [place]  . COLONOSCOPY WITH PROPOFOL N/A 05/20/2013   Dr.Rourk- inadequate prep, normal appearing rectum, grossly normal colon aside from pancolonic diverticula, normal terminal ileum bx= unremarkable colonic mucosa. Due for early interval 2016.   Marland Kitchen COLONOSCOPY WITH PROPOFOL N/A 04/26/2014   WUJ:WJXBJY ileum/one colon polyp removed/moderate pan-colonic diverticulosis/large internal hemorrhoids  . COLONOSCOPY WITH PROPOFOL N/A 12/23/2014   Dr.Rourk- minimal internal hemorrhoids, pancolonic diverticulosis  . COLONOSCOPY WITH PROPOFOL N/A 08/23/2016   Procedure: COLONOSCOPY WITH PROPOFOL;  Surgeon: Danie Binder, MD;  Location: [facility];  Service: Endoscopy;  Laterality: N/A;  . DEBRIDEMENT OF ABDOMINAL WALL ABSCESS N/A 02/08/2013   Procedure: DEBRIDEMENT OF ABDOMINAL WALL ABSCESS;  Surgeon: Jamesetta So, MD;  Location: [facility] ORS;  Service: General;  Laterality: N/A;  . ESOPHAGOGASTRODUODENOSCOPY (EGD) WITH PROPOFOL N/A 05/20/2013   Dr.Rourk- s/p prior gastric surgery with normal esophagus, residual gastric mucosa and patent efferent limb  .  ESOPHAGOGASTRODUODENOSCOPY (EGD) WITH PROPOFOL N/A 02/03/2014   Dr. Gala Romney:  s/p hemigastrectomy with retained gastric contents. Residual gastric mucosa and efferent limb appeared normal otherwise. Query gastroparesis.   Marland Kitchen ESOPHAGOGASTRODUODENOSCOPY (EGD) WITH PROPOFOL N/A 04/26/2014   NWG:[id number]'M ring/small HH/mild non-erosive gasrtitis/normal anastomosis  . ESOPHAGOGASTRODUODENOSCOPY (EGD) WITH PROPOFOL N/A 12/23/2014   Dr.Rourk- s/p prior hemigastrctomy, active oozing from anastomotic suture site, hemostasis achieved  . ESOPHAGOGASTRODUODENOSCOPY (EGD) WITH PROPOFOL N/A 05/23/2016   Dr. 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 tendon repar Right    wrist  . WOUND EXPLORATION Right 06/24/2013   Procedure: exploration of traumatic wound right wrist;  Surgeon: Tennis Must, MD;  Location: [facility];  Service: Orthopedics;  Laterality: Right;    OB History    Gravida Para Term Preterm AB Living   4 4 4  3   SAB TAB Ectopic Multiple Live Births                   Home Medications    Prior to Admission medications   Medication Sig Start Date End Date Taking? Authorizing Provider  amLODipine (NORVASC) 5 MG tablet Take 5 mg by mouth daily.    [provider]  aspirin EC 81 MG EC tablet Take 2 tablets (162 mg total) by mouth daily. Patient not taking: Reported on 12/02/2016 11/27/16   Reyne Dumas, MD  Cyanocobalamin (VITAMIN B-12) 2500 MCG TABS Take 5,000 mcg by mouth daily. 11/26/16   Reyne Dumas, MD  lisinopril (PRINIVIL,ZESTRIL) 20 MG tablet Take 1 tablet (20 mg total) by mouth daily. 11/26/16 11/26/17  Reyne Dumas, MD  nicotine (NICODERM CQ - DOSED IN MG/24 HOURS) 21 mg/24hr patch Place 1 patch (21 mg total) onto the skin daily. Patient not taking: Reported on 12/02/2016 11/27/16   Reyne Dumas, MD  pantoprazole (PROTONIX) 20 MG tablet Take 1 tablet (20 mg total) by mouth daily. 09/23/16    Francine Graven, DO  polyethylene glycol [facility] / GLYCOLAX) packet Take 17 g by mouth daily as needed for moderate constipation or severe constipation.    [provider]  potassium chloride (K-DUR) 10 MEQ tablet Take 2 tablets (20 mEq total) by mouth 2 (two) times daily. 12/22/16   Rolland Porter, MD  zolpidem (AMBIEN) 5 MG tablet Take 5 mg by mouth at bedtime as needed for sleep.    [provider]    Family History Family History  Problem Relation Age of Onset  . Brain cancer Son   . Schizophrenia Son   . Cancer Son        brain  . Lung cancer Father   . Cancer Father        mets  . Drug abuse Mother   . Breast cancer Maternal Aunt   . Bipolar disorder Maternal Aunt   . Drug abuse Maternal Aunt   . Colon cancer Maternal Grandmother        late 72s, early 2s  . Drug abuse Sister   . Drug abuse Brother   . Bipolar disorder Paternal Grandfather   . Bipolar disorder Cousin   . Liver disease Neg Hx     Social History Social History  Substance Use Topics  . Smoking status: Current Every Day Smoker    Packs/day: 0.50    Years: 35.00    Types: Cigarettes  . Smokeless tobacco: Never Used  . Alcohol use No     Allergies   Codeine; Morphine and related; and Reglan [metoclopramide]   Review of Systems Review of Systems  Unable to perform ROS: Mental status change     Physical Exam Updated Vital Signs BP 121/85   Pulse 77   Temp (!) 96.8 F (36 C) (Rectal)   Resp 16   Ht 5\' 3"  (1.6 m)   Wt 62.6 kg (138 lb)   SpO2 96%   BMI 24.45 kg/m   Vital signs normal    Physical Exam  Constitutional: She appears well-developed and well-nourished.  Patient is lying on her left side, she uses her left arm to pull up her blanket. She has an IV in her right arm.  HENT:  Head: Normocephalic and atraumatic.  Right Ear: External ear normal.  Left Ear: External ear normal.  She refuses to open her mouth  Eyes: Pupils are equal, round, and reactive to  light.  Conjunctivae and EOM are normal.  Neck: Normal range of motion.  Cardiovascular: Normal rate, regular rhythm and normal heart sounds.   Pulmonary/Chest: Effort normal and breath sounds normal. No respiratory distress.  Abdominal: Soft. Bowel sounds are normal. She exhibits no distension. There is no tenderness.  Musculoskeletal: Normal range of motion. She exhibits no deformity.  Neurological:  Patient will not cooperate for neurological exam, however if I try to drop her hand on her face she avoids hitting her face.  Skin: Skin is warm and dry. Capillary refill takes less than 2 seconds. No rash noted.  Psychiatric:  Unable to assess  Nursing note and vitals reviewed.    ED Treatments / Results  Labs (all labs ordered are listed, but only abnormal results are displayed) Results for orders placed or performed during the hospital encounter of 12/22/16  Comprehensive metabolic panel  Result Value Ref Range   Sodium 142 135 - 145 mmol/L   Potassium 2.5 (LL) 3.5 - 5.1 mmol/L   Chloride 109 101 - 111 mmol/L   CO2 24 22 - 32 mmol/L   Glucose, Bld 84 65 - 99 mg/dL   BUN 10 6 - 20 mg/dL   Creatinine, Ser 0.47 0.44 - 1.00 mg/dL   Calcium 8.4 (L) 8.9 - 10.3 mg/dL   Total Protein 6.8 6.5 - 8.1 g/dL   Albumin 3.1 (L) 3.5 - 5.0 g/dL   AST 23 15 - 41 U/L   ALT 18 14 - 54 U/L   Alkaline Phosphatase 93 38 - 126 U/L   Total Bilirubin 0.1 (L) 0.3 - 1.2 mg/dL   GFR calc non Af Amer >60 >60 mL/min   GFR calc Af Amer >60 >60 mL/min   Anion gap 9 5 - 15  Ethanol  Result Value Ref Range   Alcohol, Ethyl (B) 188 (H) <10 mg/dL  Troponin I  Result Value Ref Range   Troponin I <0.03 <0.03 ng/mL  CBC with Differential  Result Value Ref Range   WBC 7.6 4.0 - 10.5 K/uL   RBC 3.45 (L) 3.87 - 5.11 MIL/uL   Hemoglobin 9.3 (L) 12.0 - 15.0 g/dL   HCT 26.6 (L) 36.0 - 46.0 %   MCV 77.1 (L) 78.0 - 100.0 fL   MCH 27.0 26.0 - 34.0 pg   MCHC 35.0 30.0 - 36.0 g/dL   RDW 16.5 (H) 11.5 - 15.5 %    Platelets 372 150 - 400 K/uL   Neutrophils Relative % 52 %   Neutro Abs 4.0 1.7 - 7.7 K/uL   Lymphocytes Relative 39 %   Lymphs Abs 3.0 0.7 - 4.0 K/uL   Monocytes Relative 6 %   Monocytes Absolute 0.4 0.1 - 1.0 K/uL   Eosinophils Relative 3 %   Eosinophils Absolute 0.2 0.0 - 0.7 K/uL   Basophils Relative 0 %   Basophils Absolute 0.0 0.0 - 0.1 K/uL  Urinalysis, Routine w reflex microscopic  Result Value Ref Range   Color, Urine STRAW (A) YELLOW   APPearance CLEAR CLEAR   Specific Gravity, Urine 1.009 1.005 - 1.030   pH 6.0 5.0 - 8.0   Glucose, UA NEGATIVE NEGATIVE mg/dL   Hgb urine dipstick NEGATIVE NEGATIVE   Bilirubin Urine NEGATIVE NEGATIVE   Ketones, ur NEGATIVE NEGATIVE mg/dL   Protein, ur NEGATIVE NEGATIVE mg/dL   Nitrite NEGATIVE NEGATIVE   Leukocytes, UA LARGE (A) NEGATIVE   RBC / HPF 0-5 0 - 5 RBC/hpf   WBC, UA 0-5 0 - 5 WBC/hpf  Bacteria, UA RARE (A) NONE SEEN   Squamous Epithelial / LPF 0-5 (A) NONE SEEN  Urine rapid drug screen (hosp performed)  Result Value Ref Range   Opiates NONE DETECTED NONE DETECTED   Cocaine NONE DETECTED NONE DETECTED   Benzodiazepines NONE DETECTED NONE DETECTED   Amphetamines NONE DETECTED NONE DETECTED   Tetrahydrocannabinol NONE DETECTED NONE DETECTED   Barbiturates NONE DETECTED NONE DETECTED  Magnesium  Result Value Ref Range   Magnesium 1.6 (L) 1.7 - 2.4 mg/dL   Laboratory interpretation all normal except Alcohol intoxication, hypokalemia, and hypomagnesemia, stable anemia      EKG  EKG Interpretation  Date/Time:  Sunday December 22 2016 00:20:00 EDT Ventricular Rate:  80 PR Interval:    QRS Duration: 72 QT Interval:  410 QTC Calculation: 473 R Axis:   76 Text Interpretation:  Sinus rhythm Nonspecific T abnrm, anterolateral leads Baseline wander No significant change since last tracing 20 Dec 2016 Confirmed by Rolland Porter [phone]) on 12/22/2016 12:27:52 AM       Radiology  Dg Thoracic Spine 2 View  Result Date:  12/22/2016 CLINICAL DATA:  Seizure.  Back pain. EXAM: THORACIC SPINE 2 VIEWS COMPARISON:  10/21/2016 FINDINGS: normal alignment. No fracture. No degenerative change or focal lesion. IMPRESSION: Negative. Electronically Signed   By: Nelson Chimes M.D.   On: 12/22/2016 08:10   Dg Lumbar Spine Complete  Result Date: 12/22/2016 CLINICAL DATA:  Seizure.  Back pain. EXAM: LUMBAR SPINE - COMPLETE 4+ VIEW COMPARISON:  CT 11/25/2016 FINDINGS: There is no evidence of lumbar spine fracture. Alignment is normal. Intervertebral disc spaces are maintained. IMPRESSION: Normal.  No traumatic finding. Electronically Signed   By: Nelson Chimes M.D.   On: 12/22/2016 08:11   Ct Head Wo Contrast  Result Date: 12/22/2016 CLINICAL DATA:  Seizure, altered mental status EXAM: CT HEAD WITHOUT CONTRAST TECHNIQUE: Contiguous axial images were obtained from the base of the skull through the vertex without intravenous contrast. COMPARISON:  Brain MRI 12/20/2016 FINDINGS: Brain: No mass lesion, intraparenchymal hemorrhage or extra-axial collection. No evidence of acute cortical infarct. Brain parenchyma and CSF-containing spaces are normal for age. Vascular: No hyperdense vessel or unexpected calcification. Skull: Normal visualized skull base, calvarium and extracranial soft tissues. Sinuses/Orbits: No sinus fluid levels or advanced mucosal thickening. No mastoid effusion. Normal orbits. IMPRESSION: Normal head CT. Electronically Signed   By: Ulyses Jarred M.D.   On: 12/22/2016 01:13      Ct Angio Head W Or Wo Contrast  Result Date: 12/20/2016 CLINICAL DATA:  Left-sided weakness.  Follow-up stroke. MPRESSION: Minimal atherosclerosis of the aortic arch. Normal appearance of the brachiocephalic and intracranial vessels. No atherosclerotic plaque or regularity. Normal appearance of the brain. Focal density in the right upper lobe presumed to represent persistent atelectasis. Follow-up to clearing suggested subsequently. Electronically  Signed   By: Nelson Chimes M.D.   On: 12/20/2016 12:35   Ct Angio Head W Or Wo Contrast  Result Date: 11/26/2016 CLINICAL DATA:  Initial evaluation for left-sided weakness for 1 week. . IMPRESSION: Normal CTA of the head and neck. Electronically Signed   By: Jeannine Boga M.D.   On: 11/26/2016 14:55    Ct Head Wo Contrast  Result Date: 12/22/2016 CLINICAL DATA:  Seizure, altered mental status . IMPRESSION: Normal head CT. Electronically Signed   By: Ulyses Jarred M.D.   On: 12/22/2016 01:13   Ct Head Wo Contrast  Result Date: 12/02/2016 CLINICAL DATA:  Left-sided weakness  IMPRESSION: No evident  intracranial mass, hemorrhage, or extra-axial fluid collection. No focal gray-white compartment lesion is demonstrated on this study. Given patient's clinical symptoms, correlation with MR with diffusion imaging may well be advisable. There are foci of arterial vascular calcification. There is ethmoid sinus mucosal thickening at several levels. Electronically Signed   By: Lowella Grip III M.D.   On: 12/02/2016 16:10   Ct Head Wo Contrast  Result Date: 11/22/2016 CLINICAL DATA:  Left-sided weakness  IMPRESSION: No CT evidence for acute intracranial abnormality. Electronically Signed   By: Donavan Foil M.D.   On: 11/22/2016 23:29   Ct Angio Neck W Or Wo Contrast  Result Date: 12/20/2016 CLINICAL DATA:  Left-sided weakness.  Follow-up stroke.IMPRESSION: Minimal atherosclerosis of the aortic arch. Normal appearance of the brachiocephalic and intracranial vessels. No atherosclerotic plaque or regularity. Normal appearance of the brain. Focal density in the right upper lobe presumed to represent persistent atelectasis. Follow-up to clearing suggested subsequently. Electronically Signed   By: Nelson Chimes M.D.   On: 12/20/2016 12:35   Mr Brain Wo Contrast  Result Date: 12/20/2016 CLINICAL DATA:  Left lower extremity weakness for 2 months  IMPRESSION: Stable and negative brain MRI. Electronically  Signed   By: Monte Fantasia M.D.   On: 12/20/2016 14:10   Mr Brain Wo Contrast  Result Date: 12/02/2016 CLINICAL DATA:  Initial evaluation for acute left-sided weakness for 2 days. . IMPRESSION: Normal MRI of the brain. No acute intracranial abnormality identified. Electronically Signed   By: Jeannine Boga M.D.   On: 12/02/2016 18:21   Mr Brain Wo Contrast  Result Date: 11/23/2016 CLINICAL DATA:  Facial weakness IMPRESSION: Negative MRI head.  Image quality degraded by motion. Electronically Signed   By: Franchot Gallo M.D.   On: 11/23/2016 10:19   Mr Jeri Cos JO Contrast  Result Date: 12/04/2016 CLINICAL DATA:  Initial evaluation for acute ataxia for 2 days. Evaluate for stroke. Marland Kitchen IMPRESSION: MRI HEAD IMPRESSION: 1. Stable essentially normal MRI appearance of the brain. No acute intracranial infarct or other abnormality. 2. Empty sella. MRI CERVICAL SPINE IMPRESSION: 1. No acute abnormality within the cervical spine. Normal MRI appearance of the cervical spinal cord. 2. Degenerative disc osteophyte at C5-6 with resultant mild canal and left C6 foraminal stenosis. 3. Central disc protrusion at C6-7 without significant stenosis. Electronically Signed   By: Jeannine Boga M.D.   On: 12/04/2016 21:07   Mr Cervical Spine Wo Contrast  Result Date: 11/24/2016 CLINICAL DATA:  Left-sided weakness and numbness . IMPRESSION: 1. At C6-7 there is a shallow left paracentral disc protrusion. 2. At C5-6 there is a mild broad-based disc bulge. Bilateral uncovertebral degenerative changes. Mild bilateral foraminal stenosis. Electronically Signed   By: Kathreen Devoid   On: 11/24/2016 12:39   Mr Cervical Spine W Wo Contrast  Result Date: 12/04/2016 CLINICAL DATA:  Initial evaluation for acute ataxia for 2 days. Evaluate for stroke.  IMPRESSION: MRI HEAD IMPRESSION: 1. Stable essentially normal MRI appearance of the brain. No acute intracranial infarct or other abnormality. 2. Empty sella. MRI CERVICAL  SPINE IMPRESSION: 1. No acute abnormality within the cervical spine. Normal MRI appearance of the cervical spinal cord. 2. Degenerative disc osteophyte at C5-6 with resultant mild canal and left C6 foraminal stenosis. 3. Central disc protrusion at C6-7 without significant stenosis. Electronically Signed   By: Jeannine Boga M.D.   On: 12/04/2016 21:07   Ct Abdomen Pelvis W Contrast  Result Date: 11/25/2016 CLINICAL DATA:  Upper to mid  abdominal pain beginning 3 days ago. Recent stroke. IMPRESSION: No cause of acute abdominal pain is identified. Previous gastric bypass surgery. The stomach is full of material, presumably due to recent meal. Previous cholecystectomy. Previous right adrenal resection. Scarring of the anterior abdominal wall which appears the same as on the previous exam. Atelectasis in both lower lungs, left more than right. Chronic left adrenal adenoma. Aortic atherosclerosis. Healing fractures of the superior and inferior rami on the left, post traumatic versus insufficiency. Not present in July of this year. Electronically Signed   By: Nelson Chimes M.D.   On: 11/25/2016 15:18   US Carotid Bilateral  Result Date: 11/23/2016 CLINICAL DATA:  Weakness.  History of hypertension and smoking.IMPRESSION: Normal carotid Doppler ultrasound. Electronically Signed   By: Sandi Mariscal M.D.   On: 11/23/2016 12:53     Procedures Procedures (including critical care time)  Medications Ordered in ED Medications  potassium chloride 10 mEq in 100 mL IVPB (10 mEq Intravenous New Bag/Given 12/22/16 0730)  magnesium sulfate IVPB 2 g 50 mL (0 g Intravenous Stopped 12/22/16 0516)     Initial Impression / Assessment and Plan / ED Course  I have reviewed the triage vital signs and the nursing notes.  Pertinent labs & imaging results that were available during my care of the patient were reviewed by me and considered in my medical decision making (see chart for details).     Head CT was ordered  with her altered mental status and questionable seizure activity. Laboratory testing was done.  After reviewing her test results, patient was treated for her hypokalemia with IV potassium runs x 4 because at this point she is too lethargic to swallow a pill without risk of choking or aspirating. She was also given IV magnesium for her low magnesium.  3 AM patient continues to be sleeping and not interacting.  When I rechecked patient at 5:15 AM she was laying on her abdomen, she refused to talk to me or awaken with verbal stimulation or tactile stimulation. It should be noted that she keeps the covers pulled  over her head.  7 AM patient is now awake and holding verbal conversation. She does not remember what happened last night except that she remembers she drank from her sister's glass. She states she only had "one shot". She states she has not had any alcohol to drink since July. She is complaining of her back hurting and when I palpate her back she is hurting diffusely from her thoracic and lumbar midline spine. She states she's never had this before however at when I review her chart she's had complaints of back pain before. T-spine and lumbar spine x-rays were ordered. She also complained of a headache however she already had a head CT done when she first arrived.  8:30 AM patient was informed her x-ray results showed no fracture. She will be discharged home. She should follow-up with Dr. [name] to be evaluated to see if she is having a seizure disorder, she states she's had a prior stroke which would increase her risk of seizure.  Final Clinical Impressions(s) / ED Diagnoses   Final diagnoses:  Alcoholic intoxication without complication (HCC)  Hypokalemia  Hypomagnesemia    New Prescriptions New Prescriptions   POTASSIUM CHLORIDE (K-DUR) 10 MEQ TABLET    Take 2 tablets (20 mEq total) by mouth 2 (two) times daily.    Plan discharge  Rolland Porter, MD, Barbette Or,  MD 12/22/16 [phone]

## 2016-12-22 NOTE — ED Notes (Signed)
Pt back from CT, JD RN in room trying to obtain an IV

## 2016-12-22 NOTE — ED Notes (Signed)
Pt calling a ride for discharge

## 2016-12-22 NOTE — Discharge Instructions (Signed)
Take the potassium pills until gone. Please call Dr. Freddie Apley office to get an appointment to do an evaluation for possible seizure. You need to quit drinking alcohol.

## 2016-12-22 NOTE — ED Triage Notes (Signed)
Pt arrived to er after ems was contacted due to pt having "seizure type" activity, ems reports that pt had ?uncontrolled movements" , was agitated, was given 2.5 mg versed while enroute, upon arrival to er, pt is confused to time and place, speech clear, pt left side is weaker, has hx of previous stroke on 12/02/2016 that affected left side,

## 2016-12-22 NOTE — ED Notes (Signed)
Date and time results received: 12/22/16 0208 (use smartphrase ".now" to insert current time)  Test: Potassium Critical Value: 2.5  Name of Provider Notified: Dr. Tomi Bamberger notified at [facility]? Or Actions Taken?: order given to administer potassium IV

## 2016-12-26 ENCOUNTER — Ambulatory Visit: Payer: Medicare HMO

## 2016-12-29 ENCOUNTER — Emergency Department (HOSPITAL_COMMUNITY)
Admission: EM | Admit: 2016-12-29 | Discharge: 2016-12-29 | Disposition: A | Discharge status: Home / Self Care | Payer: Medicare HMO | Attending: Emergency Medicine | Admitting: Emergency Medicine

## 2016-12-29 ENCOUNTER — Encounter (HOSPITAL_COMMUNITY): Payer: Self-pay | Admitting: *Deleted

## 2016-12-29 ENCOUNTER — Emergency Department (HOSPITAL_COMMUNITY): Payer: Medicare HMO

## 2016-12-29 DIAGNOSIS — R569 Unspecified convulsions: Secondary | ICD-10-CM | POA: Insufficient documentation

## 2016-12-29 MED ORDER — AMMONIA AROMATIC IN INHA
RESPIRATORY_TRACT | Status: AC
  Filled 2016-12-29: qty 10

## 2016-12-29 NOTE — ED Triage Notes (Signed)
Pt arrived by EMS from home. Called out for cardiac arrest. Upon arrival CPR being done. Per EMS a witness seizure after they arrived.

## 2016-12-29 NOTE — ED Notes (Signed)
Pt states she wants to leave. Spoke w/ EDP & pt has a ride. Pt left AMA.

## 2016-12-29 NOTE — ED Provider Notes (Signed)
[facility] DEPT Provider Note   CSN: [id number] Arrival date & time: 12/29/16  0221     History   Chief Complaint Chief Complaint  Patient presents with  . Seizures    HPI Angel Price is a 51 y.o. female.  Patient brought to emergency department from home by ambulance. EMS report that family called when she was found to be unresponsive. Family reportedly started CPR. When first responders arrived on the scene they found that the patient had pulses. There is question of possible seizure activity. Patient has been drinking alcohol tonight. EMS reports that she has not been responding to them, however at arrival to the ER she will open her eyes to voice and answer questions. He does, however, appear to be slightly disoriented. Level V caveat due to mental status changes.      Past Medical History:  Diagnosis Date  . Abscess    soft tissue  . Adrenal mass ([place])   . Alcohol abuse   . Anxiety   . Blood transfusion without reported diagnosis   . Chronic abdominal pain   . Chronic wound infection of abdomen   . Colon polyp    colonoscopy 04/2014  . Depression   . Diverticulosis    colonoscopy 04/2014 moderat pan colonic  . Gastritis    EGD 05/2014  . Gastroparesis Nov 2015  . GERD (gastroesophageal reflux disease)   . Hemorrhoid    internal large  . Hiatal hernia   . History of Billroth II operation   . Hypertension   . Lung nodule    CT 02/2014 needs repeat 1 month  . Lung nodule < 6cm on CT 04/25/2014  . Lupus   . Nausea and vomiting    chronic, recurrent  . Schatzki's ring    patent per EGD 04/2014  . Sickle cell trait ([place])   . Suicide attempt ([place])   . 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 Acute left hemiparesis ([place]) 12/05/2016  . Malingering 12/05/2016  . Weakness of left lower extremity 12/02/2016  . CVA (cerebral vascular accident) ([place]) 11/23/2016  . Bright red rectal bleeding  08/22/2016  . Hypotension due to blood loss   . Acute GI bleeding 05/24/2016  . Dieulafoy lesion of duodenum   . History of Billroth II operation   . Gastrointestinal hemorrhage 05/22/2016  . Absolute anemia   . Pancreatitis, acute   . Acute pancreatitis 10/01/2015  . Abnormal CT scan of lung 10/01/2015  . Alcohol intoxication ([place])   . Left-sided weakness   . Psychosomatic factor in physical condition   . Upper GI bleed   . Diverticulosis of colon with hemorrhage   . Chronic wound infection of abdomen 12/22/2014  . Thyroid disease 12/22/2014  . Hypertension 12/22/2014  . Ataxia 11/01/2014  . Hemorrhoids 04/26/2014  . Lung nodule 04/26/2014  . Diverticulosis   . Gastritis   . Hiatal hernia   . Schatzki's ring   . Acute blood loss anemia 04/25/2014  . Sinus tachycardia 04/25/2014  . Hypokalemia 04/25/2014  . Hyponatremia 04/25/2014  . Hematemesis with nausea 04/25/2014  . Lung nodule < 6cm on CT 04/25/2014  . Intractable nausea and vomiting 04/24/2014  . Gastroparesis   . Abdominal pain 04/01/2014  . Chronic abdominal pain 01/21/2014  . Gastroenteritis 12/10/2013  . Chronic abdominal wound infection 08/16/2013  . MDD (major depressive disorder), recurrent episode, severe ([place]) 06/27/2013  .  Wrist laceration 06/24/2013  . Nausea with vomiting 05/07/2013  . Diarrhea 05/07/2013  . Rectal bleeding 05/07/2013  . Abnormal LFTs 05/07/2013  . Adrenal mass, left ([place]) 05/07/2013  . Abdominal wall abscess at site of surgical wound 04/19/2013  . Abdominal wall abscess 04/18/2013  . Frequent headaches 03/30/2013  . Sleep difficulties 03/30/2013  . Essential hypertension, benign 03/30/2013  . History of cocaine abuse 03/16/2013  . History of schizoaffective disorder 03/16/2013  . Bipolar disorder ([place]) 03/16/2013  . Personality disorder ([place]) 03/16/2013  . Tobacco abuse 03/16/2013  . Alcohol abuse 03/16/2013  . Palpitations 03/16/2013  . Poor vision 03/16/2013  . History of  gastric bypass 03/16/2013  . Status post hysterectomy with oophorectomy 03/16/2013  . Hypothyroid 03/16/2013  . Lupus ([place]) 03/16/2013    Past Surgical History:  Procedure Laterality Date  . ABDOMINAL HYSTERECTOMY  2013   [place]  . ABDOMINAL SURGERY    . ADRENALECTOMY Right   . AGILE CAPSULE N/A 01/05/2015   Procedure: AGILE CAPSULE;  Surgeon: Daneil Dolin, MD;  Location: [facility];  Service: Endoscopy;  Laterality: N/A;  0700  . Billroth II procedure      [place], first 2000, 2005/2006.  Marland Kitchen BIOPSY  05/20/2013   Procedure: BIOPSIES OF ASCENDING AND SIGMOID COLON;  Surgeon: Daneil Dolin, MD;  Location: [facility] ORS;  Service: Endoscopy;;  . BIOPSY  04/26/2014   Procedure: BIOPSIES;  Surgeon: Danie Binder, MD;  Location: [facility];  Service: Endoscopy;;  . CHOLECYSTECTOMY    . COLONOSCOPY     in [place]  . COLONOSCOPY WITH PROPOFOL N/A 05/20/2013   Dr.Rourk- inadequate prep, normal appearing rectum, grossly normal colon aside from pancolonic diverticula, normal terminal ileum bx= unremarkable colonic mucosa. Due for early interval 2016.   Marland Kitchen COLONOSCOPY WITH PROPOFOL N/A 04/26/2014   GMW:NUUVOZ ileum/one colon polyp removed/moderate pan-colonic diverticulosis/large internal hemorrhoids  . COLONOSCOPY WITH PROPOFOL N/A 12/23/2014   Dr.Rourk- minimal internal hemorrhoids, pancolonic diverticulosis  . COLONOSCOPY WITH PROPOFOL N/A 08/23/2016   Procedure: COLONOSCOPY WITH PROPOFOL;  Surgeon: Danie Binder, MD;  Location: [facility];  Service: Endoscopy;  Laterality: N/A;  . DEBRIDEMENT OF ABDOMINAL WALL ABSCESS N/A 02/08/2013   Procedure: DEBRIDEMENT OF ABDOMINAL WALL ABSCESS;  Surgeon: Jamesetta So, MD;  Location: [facility];  Service: General;  Laterality: N/A;  . ESOPHAGOGASTRODUODENOSCOPY (EGD) WITH PROPOFOL N/A 05/20/2013   Dr.Rourk- s/p prior gastric surgery with normal esophagus, residual gastric mucosa and patent efferent limb  . ESOPHAGOGASTRODUODENOSCOPY (EGD) WITH PROPOFOL N/A  02/03/2014   Dr. Gala Romney:  s/p hemigastrectomy with retained gastric contents. Residual gastric mucosa and efferent limb appeared normal otherwise. Query gastroparesis.   Marland Kitchen ESOPHAGOGASTRODUODENOSCOPY (EGD) WITH PROPOFOL N/A 04/26/2014   DGU:[id number] ring/small HH/mild non-erosive gasrtitis/normal anastomosis  . ESOPHAGOGASTRODUODENOSCOPY (EGD) WITH PROPOFOL N/A 12/23/2014   Dr.Rourk- s/p prior hemigastrctomy, active oozing from anastomotic suture site, hemostasis achieved  . ESOPHAGOGASTRODUODENOSCOPY (EGD) WITH PROPOFOL N/A 05/23/2016   Dr. 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 tendon repar Right    wrist  . WOUND EXPLORATION Right 06/24/2013   Procedure: exploration of traumatic wound right wrist;  Surgeon: Tennis Must, MD;  Location: [facility];  Service: Orthopedics;  Laterality: Right;    OB History    Gravida Para Term Preterm AB Living   4 4 4     3    SAB TAB Ectopic Multiple Live Births  Home Medications    Prior to Admission medications   Medication Sig Start Date End Date Taking? Authorizing Provider  amLODipine (NORVASC) 5 MG tablet Take 5 mg by mouth daily.   Yes [provider]  aspirin EC 81 MG EC tablet Take 2 tablets (162 mg total) by mouth daily. 11/27/16  Yes Reyne Dumas, MD  Cyanocobalamin (VITAMIN B-12) 2500 MCG TABS Take 5,000 mcg by mouth daily. 11/26/16  Yes Reyne Dumas, MD  lisinopril (PRINIVIL,ZESTRIL) 20 MG tablet Take 1 tablet (20 mg total) by mouth daily. 11/26/16 11/26/17 Yes Reyne Dumas, MD  nicotine (NICODERM CQ - DOSED IN MG/24 HOURS) 21 mg/24hr patch Place 1 patch (21 mg total) onto the skin daily. 11/27/16  Yes Reyne Dumas, MD  pantoprazole (PROTONIX) 20 MG tablet Take 1 tablet (20 mg total) by mouth daily. 09/23/16  Yes Francine Graven, DO  polyethylene glycol (MIRALAX / GLYCOLAX) packet Take 17 g by mouth daily as needed for  moderate constipation or severe constipation.   Yes [provider]  potassium chloride (K-DUR) 10 MEQ tablet Take 2 tablets (20 mEq total) by mouth 2 (two) times daily. 12/22/16  Yes Rolland Porter, MD  zolpidem (AMBIEN) 5 MG tablet Take 5 mg by mouth at bedtime as needed for sleep.   Yes [provider]    Family History Family History  Problem Relation Age of Onset  . Brain cancer Son   . Schizophrenia Son   . Cancer Son        brain  . Lung cancer Father   . Cancer Father        mets  . Drug abuse Mother   . Breast cancer Maternal Aunt   . Bipolar disorder Maternal Aunt   . Drug abuse Maternal Aunt   . Colon cancer Maternal Grandmother        late 2s, early 80s  . Drug abuse Sister   . Drug abuse Brother   . Bipolar disorder Paternal Grandfather   . Bipolar disorder Cousin   . Liver disease Neg Hx     Social History Social History  Substance Use Topics  . Smoking status: Current Every Day Smoker    Packs/day: 0.50    Years: 35.00    Types: Cigarettes  . Smokeless tobacco: Never Used  . Alcohol use No     Allergies   Codeine; Morphine and related; and Reglan [metoclopramide]   Review of Systems Review of Systems  Unable to perform ROS: Mental status change     Physical Exam Updated Vital Signs BP (!) 155/106 (BP Location: Left Arm)   Pulse 89   Temp 98.1 F (36.7 C) (Oral)   Resp 18   Ht 5\' 3"  (1.6 m)   Wt 62.6 kg (138 lb)   SpO2 100%   BMI 24.45 kg/m   Physical Exam  Constitutional: She appears well-developed and well-nourished. No distress.  HENT:  Head: Normocephalic and atraumatic.  Right Ear: Hearing normal.  Left Ear: Hearing normal.  Nose: Nose normal.  Mouth/Throat: Oropharynx is clear and moist and mucous membranes are normal.  Eyes: Pupils are equal, round, and reactive to light. Conjunctivae and EOM are normal.  Patient resists eye opening, when I try to open her eyes she squeezes her eyes shut and does not allow me  to open them. Immediately after this, however, she did open her eyes to voice  Neck: Normal range of motion. Neck supple.  Cardiovascular: Regular rhythm, S1 normal and S2 normal.  Exam reveals no gallop and no friction rub.   No murmur heard. Pulmonary/Chest: Effort normal and breath sounds normal. No respiratory distress. She exhibits no tenderness.  Abdominal: Soft. Normal appearance and bowel sounds are normal. There is no hepatosplenomegaly. There is no tenderness. There is no rebound, no guarding, no tenderness at McBurney's point and negative Murphy's sign. No hernia.  Musculoskeletal: Normal range of motion.  Neurological: She is alert. She has normal strength. No cranial nerve deficit or sensory deficit. Coordination normal. GCS eye subscore is 4. GCS verbal subscore is 5. GCS motor subscore is 6.  Disoriented to place, knows the year but not the president  Skin: Skin is warm, dry and intact. No rash noted. No cyanosis.  Psychiatric: Her speech is delayed.  Nursing note and vitals reviewed.    ED Treatments / Results  Labs (all labs ordered are listed, but only abnormal results are displayed) Labs Reviewed  CBC WITH DIFFERENTIAL/PLATELET  COMPREHENSIVE METABOLIC PANEL  AMMONIA  SALICYLATE LEVEL  ACETAMINOPHEN LEVEL  ETHANOL  RAPID URINE DRUG SCREEN, HOSP PERFORMED  URINALYSIS, ROUTINE W REFLEX MICROSCOPIC    EKG  EKG Interpretation None       Radiology No results found.  Procedures Procedures (including critical care time)  Medications Ordered in ED Medications  ammonia inhalant (not administered)     Initial Impression / Assessment and Plan / ED Course  I have reviewed the triage vital signs and the nursing notes.  Pertinent labs & imaging results that were available during my care of the patient were reviewed by me and considered in my medical decision making (see chart for details).     Patient presents to the ER for what appears to have been a  syncopal episode. She was seen in the ER several days ago with a similar presentation, found to be intoxicated. She admits to drinking tonight. At arrival she was clearly pretending to be obtunded, would not let me open her eyes or answer questions. When threatened with smelling salts, she opened her eyes and started to follow commands and answer questions. Workup was initiated.Her family member enter the room and she became agitated, started to yellow at him. She became extremely agitated and refused any further workup and left the emergency department [place].  Final Clinical Impressions(s) / ED Diagnoses   Final diagnoses:  Seizure-like activity [facility])    New Prescriptions New Prescriptions   No medications on file     Orpah Greek, MD 12/29/16 0300

## 2017-01-02 ENCOUNTER — Encounter (HOSPITAL_COMMUNITY): Payer: Self-pay | Admitting: Emergency Medicine

## 2017-01-02 ENCOUNTER — Emergency Department (HOSPITAL_COMMUNITY): Payer: Medicare HMO

## 2017-01-02 ENCOUNTER — Observation Stay (HOSPITAL_BASED_OUTPATIENT_CLINIC_OR_DEPARTMENT_OTHER)
Admission: EM | Admit: 2017-01-02 | Discharge: 2017-01-03 | Disposition: A | Discharge status: Home / Self Care | Payer: Medicare HMO | Source: Home / Self Care | Attending: Emergency Medicine | Admitting: Emergency Medicine

## 2017-01-02 DIAGNOSIS — L03311 Cellulitis of abdominal wall: Secondary | ICD-10-CM | POA: Diagnosis not present

## 2017-01-02 DIAGNOSIS — Z79899 Other long term (current) drug therapy: Secondary | ICD-10-CM

## 2017-01-02 DIAGNOSIS — R109 Unspecified abdominal pain: Secondary | ICD-10-CM

## 2017-01-02 DIAGNOSIS — D573 Sickle-cell trait: Secondary | ICD-10-CM

## 2017-01-02 DIAGNOSIS — L089 Local infection of the skin and subcutaneous tissue, unspecified: Secondary | ICD-10-CM | POA: Diagnosis not present

## 2017-01-02 DIAGNOSIS — I6529 Occlusion and stenosis of unspecified carotid artery: Secondary | ICD-10-CM | POA: Insufficient documentation

## 2017-01-02 DIAGNOSIS — S31109A Unspecified open wound of abdominal wall, unspecified quadrant without penetration into peritoneal cavity, initial encounter: Secondary | ICD-10-CM | POA: Diagnosis not present

## 2017-01-02 DIAGNOSIS — M329 Systemic lupus erythematosus, unspecified: Secondary | ICD-10-CM

## 2017-01-02 DIAGNOSIS — R0602 Shortness of breath: Secondary | ICD-10-CM | POA: Diagnosis not present

## 2017-01-02 DIAGNOSIS — K859 Acute pancreatitis without necrosis or infection, unspecified: Secondary | ICD-10-CM

## 2017-01-02 DIAGNOSIS — G8929 Other chronic pain: Secondary | ICD-10-CM | POA: Diagnosis present

## 2017-01-02 DIAGNOSIS — F419 Anxiety disorder, unspecified: Secondary | ICD-10-CM

## 2017-01-02 DIAGNOSIS — K219 Gastro-esophageal reflux disease without esophagitis: Secondary | ICD-10-CM | POA: Insufficient documentation

## 2017-01-02 DIAGNOSIS — Z7982 Long term (current) use of aspirin: Secondary | ICD-10-CM | POA: Insufficient documentation

## 2017-01-02 DIAGNOSIS — I700 Atherosclerosis of aorta: Secondary | ICD-10-CM | POA: Insufficient documentation

## 2017-01-02 DIAGNOSIS — L02211 Cutaneous abscess of abdominal wall: Secondary | ICD-10-CM | POA: Diagnosis not present

## 2017-01-02 DIAGNOSIS — Z9049 Acquired absence of other specified parts of digestive tract: Secondary | ICD-10-CM

## 2017-01-02 DIAGNOSIS — R079 Chest pain, unspecified: Secondary | ICD-10-CM | POA: Diagnosis not present

## 2017-01-02 DIAGNOSIS — I10 Essential (primary) hypertension: Secondary | ICD-10-CM | POA: Insufficient documentation

## 2017-01-02 DIAGNOSIS — L0291 Cutaneous abscess, unspecified: Secondary | ICD-10-CM | POA: Diagnosis not present

## 2017-01-02 HISTORY — DX: Acute pancreatitis without necrosis or infection, unspecified: K85.90

## 2017-01-02 LAB — COMPREHENSIVE METABOLIC PANEL
ALBUMIN: 3.3 g/dL — AB (ref 3.5–5.0)
ALT: 20 U/L (ref 14–54)
ANION GAP: 13 (ref 5–15)
AST: 23 U/L (ref 15–41)
Alkaline Phosphatase: 121 U/L (ref 38–126)
BILIRUBIN TOTAL: 0.3 mg/dL (ref 0.3–1.2)
BUN: 7 mg/dL (ref 6–20)
CHLORIDE: 105 mmol/L (ref 101–111)
CO2: 17 mmol/L — ABNORMAL LOW (ref 22–32)
Calcium: 8.8 mg/dL — ABNORMAL LOW (ref 8.9–10.3)
Creatinine, Ser: 0.48 mg/dL (ref 0.44–1.00)
GFR calc Af Amer: >60 mL/min (ref >60)
GFR calc non Af Amer: >60 mL/min (ref >60)
GLUCOSE: 228 mg/dL — AB (ref 65–99)
POTASSIUM: 2.7 mmol/L — AB (ref 3.5–5.1)
SODIUM: 135 mmol/L (ref 135–145)
TOTAL PROTEIN: 7.4 g/dL (ref 6.5–8.1)

## 2017-01-02 LAB — URINALYSIS, ROUTINE W REFLEX MICROSCOPIC
Bacteria, UA: NONE SEEN
Bilirubin Urine: NEGATIVE
Glucose, UA: NEGATIVE mg/dL
Hgb urine dipstick: NEGATIVE
KETONES UR: NEGATIVE mg/dL
Nitrite: NEGATIVE
PROTEIN: NEGATIVE mg/dL
Specific Gravity, Urine: 1.023 (ref 1.005–1.030)
pH: 6 (ref 5.0–8.0)

## 2017-01-02 LAB — CBC WITH DIFFERENTIAL/PLATELET
BASOS ABS: 0 10*3/uL (ref 0.0–0.1)
Basophils Relative: 0 %
Eosinophils Absolute: 0.3 10*3/uL (ref 0.0–0.7)
Eosinophils Relative: 2 %
HEMATOCRIT: 31.3 % — AB (ref 36.0–46.0)
Hemoglobin: 10.9 g/dL — ABNORMAL LOW (ref 12.0–15.0)
LYMPHS PCT: 40 %
Lymphs Abs: 4.6 10*3/uL — ABNORMAL HIGH (ref 0.7–4.0)
MCH: 26.1 pg (ref 26.0–34.0)
MCHC: 34.8 g/dL (ref 30.0–36.0)
MCV: 75.1 fL — AB (ref 78.0–100.0)
MONO ABS: 0.5 10*3/uL (ref 0.1–1.0)
Monocytes Relative: 5 %
NEUTROS ABS: 6.2 10*3/uL (ref 1.7–7.7)
Neutrophils Relative %: 53 %
Platelets: 457 10*3/uL — ABNORMAL HIGH (ref 150–400)
RBC: 4.17 MIL/uL (ref 3.87–5.11)
RDW: 17 % — ABNORMAL HIGH (ref 11.5–15.5)
WBC: 11.5 10*3/uL — ABNORMAL HIGH (ref 4.0–10.5)

## 2017-01-02 LAB — I-STAT CG4 LACTIC ACID, ED
LACTIC ACID, VENOUS: 3.89 mmol/L — AB (ref 0.5–1.9)
LACTIC ACID, VENOUS: 0.92 mmol/L (ref 0.5–1.9)

## 2017-01-02 LAB — LIPASE, BLOOD: Lipase: 21 U/L (ref 11–51)

## 2017-01-02 MED ORDER — IOPAMIDOL (ISOVUE-300) INJECTION 61%
INTRAVENOUS | Status: AC
  Administered 2017-01-02: 100 mL
  Filled 2017-01-02: qty 100

## 2017-01-02 MED ORDER — POTASSIUM CHLORIDE CRYS ER 20 MEQ PO TBCR
40.0000 meq | EXTENDED_RELEASE_TABLET | Freq: Once | ORAL | Status: AC
  Administered 2017-01-02: 40 meq via ORAL
  Filled 2017-01-02: qty 2

## 2017-01-02 MED ORDER — FENTANYL CITRATE (PF) 100 MCG/2ML IJ SOLN
100.0000 ug | Freq: Once | INTRAMUSCULAR | Status: AC
  Administered 2017-01-02: 100 ug via INTRAVENOUS
  Filled 2017-01-02: qty 2

## 2017-01-02 MED ORDER — POLYETHYLENE GLYCOL 3350 17 G PO PACK: 17.0000 g | PACK | Freq: Every day | ORAL | Status: DC | PRN

## 2017-01-02 MED ORDER — ACETAMINOPHEN 650 MG RE SUPP: 650.0000 mg | Freq: Four times a day (QID) | RECTAL | Status: DC | PRN

## 2017-01-02 MED ORDER — VANCOMYCIN HCL IN DEXTROSE 750-5 MG/150ML-% IV SOLN
750.0000 mg | Freq: Two times a day (BID) | INTRAVENOUS | Status: DC
  Administered 2017-01-03: 750 mg via INTRAVENOUS
  Filled 2017-01-02 (×3): qty 150

## 2017-01-02 MED ORDER — ACETAMINOPHEN 325 MG PO TABS: 650.0000 mg | ORAL_TABLET | Freq: Four times a day (QID) | ORAL | Status: DC | PRN

## 2017-01-02 MED ORDER — ONDANSETRON HCL 4 MG/2ML IJ SOLN: 4.0000 mg | Freq: Four times a day (QID) | INTRAMUSCULAR | Status: DC | PRN

## 2017-01-02 MED ORDER — PIPERACILLIN-TAZOBACTAM 3.375 G IVPB: 3.3750 g | Freq: Three times a day (TID) | INTRAVENOUS | Status: DC

## 2017-01-02 MED ORDER — FENTANYL CITRATE (PF) 100 MCG/2ML IJ SOLN
150.0000 ug | Freq: Once | INTRAMUSCULAR | Status: AC
  Administered 2017-01-02: 150 ug via INTRAVENOUS
  Filled 2017-01-02: qty 4

## 2017-01-02 MED ORDER — PANTOPRAZOLE SODIUM 20 MG PO TBEC
20.0000 mg | DELAYED_RELEASE_TABLET | Freq: Every day | ORAL | Status: DC
  Administered 2017-01-03: 20 mg via ORAL
  Filled 2017-01-02 (×2): qty 1

## 2017-01-02 MED ORDER — VANCOMYCIN HCL IN DEXTROSE 1-5 GM/200ML-% IV SOLN
1000.0000 mg | Freq: Once | INTRAVENOUS | Status: AC
  Administered 2017-01-02: 1000 mg via INTRAVENOUS
  Filled 2017-01-02: qty 200

## 2017-01-02 MED ORDER — LISINOPRIL 20 MG PO TABS
20.0000 mg | ORAL_TABLET | Freq: Every day | ORAL | Status: DC
  Administered 2017-01-03: 20 mg via ORAL
  Filled 2017-01-02: qty 1

## 2017-01-02 MED ORDER — POTASSIUM CHLORIDE 10 MEQ/100ML IV SOLN
10.0000 meq | INTRAVENOUS | Status: AC
  Administered 2017-01-02 (×3): 10 meq via INTRAVENOUS
  Filled 2017-01-02 (×4): qty 100

## 2017-01-02 MED ORDER — ONDANSETRON HCL 4 MG PO TABS: 4.0000 mg | ORAL_TABLET | Freq: Four times a day (QID) | ORAL | Status: DC | PRN

## 2017-01-02 MED ORDER — ENOXAPARIN SODIUM 40 MG/0.4ML ~~LOC~~ SOLN
40.0000 mg | SUBCUTANEOUS | Status: DC
  Administered 2017-01-03: 40 mg via SUBCUTANEOUS
  Filled 2017-01-02 (×2): qty 0.4

## 2017-01-02 MED ORDER — PIPERACILLIN-TAZOBACTAM 3.375 G IVPB 30 MIN
3.3750 g | Freq: Once | INTRAVENOUS | Status: AC
  Administered 2017-01-02: 3.375 g via INTRAVENOUS
  Filled 2017-01-02: qty 50

## 2017-01-02 MED ORDER — ZOLPIDEM TARTRATE 5 MG PO TABS: 5.0000 mg | ORAL_TABLET | Freq: Every evening | ORAL | Status: DC | PRN

## 2017-01-02 MED ORDER — AMLODIPINE BESYLATE 5 MG PO TABS
5.0000 mg | ORAL_TABLET | Freq: Every day | ORAL | Status: DC
  Administered 2017-01-03: 5 mg via ORAL
  Filled 2017-01-02: qty 1

## 2017-01-02 MED ORDER — KETOROLAC TROMETHAMINE 30 MG/ML IJ SOLN
30.0000 mg | Freq: Four times a day (QID) | INTRAMUSCULAR | Status: DC | PRN
  Administered 2017-01-02 – 2017-01-03 (×3): 30 mg via INTRAVENOUS
  Filled 2017-01-02 (×3): qty 1

## 2017-01-02 MED ORDER — ASPIRIN 81 MG PO TBEC: 162.0000 mg | DELAYED_RELEASE_TABLET | Freq: Every day | ORAL | Status: DC

## 2017-01-02 MED ORDER — SODIUM CHLORIDE 0.9 % IV BOLUS (SEPSIS)
1000.0000 mL | Freq: Once | INTRAVENOUS | Status: AC
  Administered 2017-01-02: 1000 mL via INTRAVENOUS

## 2017-01-02 MED ORDER — ASPIRIN EC 81 MG PO TBEC
162.0000 mg | DELAYED_RELEASE_TABLET | Freq: Every day | ORAL | Status: DC
  Administered 2017-01-03: 162 mg via ORAL
  Filled 2017-01-02: qty 2

## 2017-01-02 NOTE — ED Notes (Signed)
Pt taken to CT.

## 2017-01-02 NOTE — H&P (Signed)
History and Physical    Angel Price URK:[id number] DOB: 08-Mar-1966 DOA: 01/02/2017  PCP: Rosita Fire, MD  Patient coming from: Home  I have personally briefly reviewed patient's old medical records in [place]  Chief Complaint: Abd wall cellulitis  HPI: Angel Price is a 51 y.o. female with medical history significant of Abd surgery, chronic abd pain, chronic recurrent cellulitis of abd wall.  Patient presents to the ED with c/o 1 week history of abd pain and cellulitis of abd wall.   ED Course: EDP put patient on zosyn / vanc, and gave fentanyl.  CT shows cellulitis, no abscess.  Lactate 3 initially, clears with IVF.   Review of Systems: As per HPI otherwise 10 point review of systems negative.   Past Medical History:  Diagnosis Date  . Abscess    soft tissue  . Adrenal mass ([place])   . Alcohol abuse   . Anxiety   . Blood transfusion without reported diagnosis   . Chronic abdominal pain   . Chronic wound infection of abdomen   . Colon polyp    colonoscopy 04/2014  . Depression   . Diverticulosis    colonoscopy 04/2014 moderat pan colonic  . Gastritis    EGD 05/2014  . Gastroparesis Nov 2015  . GERD (gastroesophageal reflux disease)   . Hemorrhoid    internal large  . Hiatal hernia   . History of Billroth II operation   . Hypertension   . Lung nodule    CT 02/2014 needs repeat 1 month  . Lung nodule < 6cm on CT 04/25/2014  . Lupus   . Nausea and vomiting    chronic, recurrent  . Pancreatitis   . Schatzki's ring    patent per EGD 04/2014  . Sickle cell trait ([place])   . Suicide attempt ([place])   . 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 ABDOMINAL HYSTERECTOMY  2013   [place]  . ABDOMINAL SURGERY    . ADRENALECTOMY Right   . AGILE CAPSULE N/A 01/05/2015   Procedure: AGILE CAPSULE;  Surgeon: Daneil Dolin, MD;  [facility]: [facility];  Service: Endoscopy;  Laterality: N/A;  0700   . Billroth II procedure      [place], first 2000, 2005/2006.  Marland Kitchen BIOPSY  05/20/2013   Procedure: BIOPSIES OF ASCENDING AND SIGMOID COLON;  Surgeon: Daneil Dolin, MD;  Location: [facility];  Service: Endoscopy;;  . BIOPSY  04/26/2014   Procedure: BIOPSIES;  Surgeon: Danie Binder, MD;  Location: [facility];  Service: Endoscopy;;  . CHOLECYSTECTOMY    . COLONOSCOPY     in [place]  . COLONOSCOPY WITH PROPOFOL N/A 05/20/2013   Dr.Rourk- inadequate prep, normal appearing rectum, grossly normal colon aside from pancolonic diverticula, normal terminal ileum bx= unremarkable colonic mucosa. Due for early interval 2016.   Marland Kitchen COLONOSCOPY WITH PROPOFOL N/A 04/26/2014   GBT:DVVOHY ileum/one colon polyp removed/moderate pan-colonic diverticulosis/large internal hemorrhoids  . COLONOSCOPY WITH PROPOFOL N/A 12/23/2014   Dr.Rourk- minimal internal hemorrhoids, pancolonic diverticulosis  . COLONOSCOPY WITH PROPOFOL N/A 08/23/2016   Procedure: COLONOSCOPY WITH PROPOFOL;  Surgeon: Danie Binder, MD;  [facility];  Service: Endoscopy;  Laterality: N/A;  . DEBRIDEMENT OF ABDOMINAL WALL ABSCESS N/A 02/08/2013   Procedure: DEBRIDEMENT OF ABDOMINAL WALL ABSCESS;  Surgeon: Jamesetta So, MD;  Location: [facility];  Service: General;  Laterality: N/A;  . ESOPHAGOGASTRODUODENOSCOPY (EGD) WITH  PROPOFOL N/A 05/20/2013   Dr.Rourk- s/p prior gastric surgery with normal esophagus, residual gastric mucosa and patent efferent limb  . ESOPHAGOGASTRODUODENOSCOPY (EGD) WITH PROPOFOL N/A 02/03/2014   Dr. Gala Romney:  s/p hemigastrectomy with retained gastric contents. Residual gastric mucosa and efferent limb appeared normal otherwise. Query gastroparesis.   Marland Kitchen ESOPHAGOGASTRODUODENOSCOPY (EGD) WITH PROPOFOL N/A 04/26/2014   JXB:JYNWGNFA'O ring/small HH/mild non-erosive gasrtitis/normal anastomosis  . ESOPHAGOGASTRODUODENOSCOPY (EGD) WITH PROPOFOL N/A 12/23/2014   Dr.Rourk- s/p prior hemigastrctomy, active oozing from anastomotic suture site,  hemostasis achieved  . ESOPHAGOGASTRODUODENOSCOPY (EGD) WITH PROPOFOL N/A 05/23/2016   Dr. 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 tendon repar Right    wrist  . WOUND EXPLORATION Right 06/24/2013   Procedure: exploration of traumatic wound right wrist;  Surgeon: Tennis Must, MD;  Location: [facility];  Service: Orthopedics;  Laterality: Right;     reports that she has been smoking Cigarettes.  She has a 17.50 pack-year smoking history. She has never used smokeless tobacco. She reports that she does not drink alcohol or use drugs.  Allergies  Allergen Reactions  . Codeine Hives  . Morphine And Related Hives  . Reglan [Metoclopramide] Other (See Comments)    EPS symptoms     Family History  Problem Relation Age of Onset  . Brain cancer Son   . Schizophrenia Son   . Cancer Son        brain  . Lung cancer Father   . Cancer Father        mets  . Drug abuse Mother   . Breast cancer Maternal Aunt   . Bipolar disorder Maternal Aunt   . Drug abuse Maternal Aunt   . Colon cancer Maternal Grandmother        late 18s, early 68s  . Drug abuse Sister   . Drug abuse Brother   . Bipolar disorder Paternal Grandfather   . Bipolar disorder Cousin   . Liver disease Neg Hx      Prior to Admission medications   Medication Sig Start Date End Date Taking? Authorizing Provider  amLODipine (NORVASC) 5 MG tablet Take 5 mg by mouth daily.   Yes [provider]  aspirin EC 81 MG EC tablet Take 2 tablets (162 mg total) by mouth daily. 11/27/16  Yes Reyne Dumas, MD  Cyanocobalamin (VITAMIN B-12) 2500 MCG TABS Take 5,000 mcg by mouth daily. 11/26/16  Yes Reyne Dumas, MD  lisinopril (PRINIVIL,ZESTRIL) 20 MG tablet Take 1 tablet (20 mg total) by mouth daily. 11/26/16 11/26/17 Yes Reyne Dumas, MD  pantoprazole (PROTONIX) 20 MG tablet Take 1 tablet (20 mg total) by mouth daily. 09/23/16  Yes Francine Graven, DO  polyethylene glycol (MIRALAX / GLYCOLAX) packet Take 17 g by mouth daily as needed for moderate constipation or severe constipation.   Yes [provider]  zolpidem (AMBIEN) 5 MG tablet Take 5 mg by mouth at bedtime as needed for sleep.   Yes [provider]  nicotine (NICODERM CQ - DOSED IN MG/24 HOURS) 21 mg/24hr patch Place 1 patch (21 mg total) onto the skin daily. Patient not taking: Reported on 01/02/2017 11/27/16   Reyne Dumas, MD  potassium chloride (K-DUR) 10 MEQ tablet Take 2 tablets (20 mEq total) by mouth 2 (two) times daily. Patient not taking: Reported on 01/02/2017 12/22/16   Rolland Porter, MD    Physical Exam: Vitals:   01/02/17 1900 01/02/17  1915 01/02/17 1930 01/02/17 1945  BP: (!) 153/106 (!) 148/102 (!) 156/109 (!) 145/99  Pulse: 92 87 (!) 105 (!) 102  Resp: (!) 21 (!) 23 20 (!) 24  Temp:      TempSrc:      SpO2: 97% 98% 92% 97%  Weight:      Height:        Constitutional: NAD, calm, comfortable Eyes: PERRL, lids and conjunctivae normal ENMT: Mucous membranes are moist. Posterior pharynx clear of any exudate or lesions.Normal dentition.  Neck: normal, supple, no masses, no thyromegaly Respiratory: clear to auscultation bilaterally, no wheezing, no crackles. Normal respiratory effort. No accessory muscle use.  Cardiovascular: Regular rate and rhythm, no murmurs / rubs / gallops. No extremity edema. 2+ pedal pulses. No carotid bruits.  Abdomen: Mild cellulitis of abd wall. Musculoskeletal: no clubbing / cyanosis. No joint deformity upper and lower extremities. Good ROM, no contractures. Normal muscle tone.  Skin: no rashes, lesions, ulcers. No induration Neurologic: CN 2-12 grossly intact. Sensation intact, DTR normal. Strength 5/5 in all 4.  Psychiatric: Normal judgment and insight. Alert and oriented x 3. Normal mood.    Labs on Admission: I have personally reviewed following labs and imaging studies  CBC:  Recent Labs Lab  01/02/17 1350  WBC 11.5*  NEUTROABS 6.2  HGB 10.9*  HCT 31.3*  MCV 75.1*  PLT 607*   Basic Metabolic Panel:  Recent Labs Lab 01/02/17 1350  NA 135  K 2.7*  CL 105  CO2 17*  GLUCOSE 228*  BUN 7  CREATININE 0.48  CALCIUM 8.8*   GFR: Estimated Creatinine Clearance: 74 mL/min (by C-G formula based on SCr of 0.48 mg/dL). Liver Function Tests:  Recent Labs Lab 01/02/17 1350  AST 23  ALT 20  ALKPHOS 121  BILITOT 0.3  PROT 7.4  ALBUMIN 3.3*    Recent Labs Lab 01/02/17 1350  LIPASE 21   No results for input(s): AMMONIA in the last 168 hours. Coagulation Profile: No results for input(s): INR, PROTIME in the last 168 hours. Cardiac Enzymes: No results for input(s): CKTOTAL, CKMB, CKMBINDEX, TROPONINI in the last 168 hours. BNP (last 3 results) No results for input(s): PROBNP in the last 8760 hours. HbA1C: No results for input(s): HGBA1C in the last 72 hours. CBG: No results for input(s): GLUCAP in the last 168 hours. Lipid Profile: No results for input(s): CHOL, HDL, LDLCALC, TRIG, CHOLHDL, LDLDIRECT in the last 72 hours. Thyroid Function Tests: No results for input(s): TSH, T4TOTAL, FREET4, T3FREE, THYROIDAB in the last 72 hours. Anemia Panel: No results for input(s): VITAMINB12, FOLATE, FERRITIN, TIBC, IRON, RETICCTPCT in the last 72 hours. Urine analysis:    Component Value Date/Time   COLORURINE STRAW (A) 01/02/2017 [place] 01/02/2017 1854   LABSPEC 1.023 01/02/2017 1854   PHURINE 6.0 01/02/2017 1854   GLUCOSEU NEGATIVE 01/02/2017 [employer] 01/02/2017 [place] 01/02/2017 [place] 01/02/2017 1854   PROTEINUR NEGATIVE 01/02/2017 1854   UROBILINOGEN 0.2 11/26/2014 0915   NITRITE NEGATIVE 01/02/2017 1854   LEUKOCYTESUR TRACE (A) 01/02/2017 1854    Radiological Exams on Admission: Dg Chest 2 View  Result Date: 01/02/2017 CLINICAL DATA:  Shortness of breath, weakness, and chest pain.  Painful knot in the abdomen. Current smoker. EXAM: CHEST  2 VIEW COMPARISON:  Chest x-ray of September 23, 2016 FINDINGS: The lungs are adequately inflated and clear. The heart and pulmonary vascularity are normal. The mediastinum is  normal in width. There is no pleural effusion. The bony thorax exhibits no acute abnormality. IMPRESSION: There is no acute cardiopulmonary abnormality. Electronically Signed   By: David  Martinique M.D.   On: 01/02/2017 15:09   Ct Abdomen Pelvis W Contrast  Result Date: 01/02/2017 CLINICAL DATA:  Abdominal pain and fever. EXAM: CT ABDOMEN AND PELVIS WITH CONTRAST TECHNIQUE: Multidetector CT imaging of the abdomen and pelvis was performed using the standard protocol following bolus administration of intravenous contrast. CONTRAST:  160mL ISOVUE-300 IOPAMIDOL (ISOVUE-300) INJECTION 61% COMPARISON:  CT abdomen pelvis dated November 25, 2016. FINDINGS: Lower chest: No acute abnormality. Hepatobiliary: No focal liver abnormality is seen. Status post cholecystectomy. Mild biliary dilatation is unchanged, and likely related to post cholecystectomy state. Pancreas: Atrophy. No pancreatic ductal dilatation or surrounding inflammatory changes. Spleen: Normal in size without focal abnormality. Adrenals/Urinary Tract: The right adrenal gland is surgically absent. Unchanged left adrenal adenoma. The kidneys are unremarkable. No hydronephrosis or renal calculi. Mild fullness of the left distal ureter. The bladder is unremarkable. 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 No evidence of bowel wall thickening, distention, or surrounding inflammatory changes. Vascular/Lymphatic: Aortic atherosclerosis. No enlarged abdominal or pelvic lymph nodes. Reproductive: Status post hysterectomy. No adnexal masses. Other: Scarring of the anterior abdominal wall is again noted, with increased somewhat nodular inflammatory stranding in the right paramidline mid abdominal wall. No free fluid  or pneumoperitoneum. Musculoskeletal: Unchanged healing left superior and inferior pubic rami fractures. No new fracture. IMPRESSION: 1. Increased somewhat nodular inflammatory stranding in the right paramidline mid abdominal wall at the site of prior surgery, which could reflect cellulitis. No definite drainable fluid collection. 2. Mild fullness of the left distal ureter without evidence of obstructing calculus. 3.  Aortic atherosclerosis (ICD10-I70.0). 4. Unchanged healing left superior and inferior pubic rami fractures. Electronically Signed   By: Titus Dubin M.D.   On: 01/02/2017 18:48    EKG: Independently reviewed.  Assessment/Plan Principal Problem:   Cellulitis of abdominal wall Active Problems:   Chronic abdominal pain   Chronic wound infection of abdomen    1. Cellulitis of abd wall - appears mild clinically 1. Vanc per pharm 2. IVF: 2L in ED 3. Repeat CBC, BMP in AM 2. Chronic abd pain - 1. Toradol 2. Avoid narcotics 3. Needs to see pain management as outpatient  DVT prophylaxis: Lovenox Code Status: Full Family Communication: Husband at bedside Disposition Plan: Home after admit Consults called: None Admission status: Place in obs   Charliene Inoue, [facility] Pager [phone]  If 7AM-7PM, please contact day team taking care of patient [email or web address] Password [email or web address]  01/02/2017, 8:02 PM

## 2017-01-02 NOTE — ED Triage Notes (Signed)
To ED via private vehicle with c/o abd pain, has red/swollen area around incision from old surgery, also c/o abd pain, has pancreatitis and drank beer last night "to help me cope with the anniversary of my son's death"

## 2017-01-02 NOTE — Progress Notes (Signed)
Pharmacy Antibiotic Note  Angel Price is a 51 y.o. female admitted on 01/02/2017 with cellulitis of old surgical incision.  Pharmacy has been consulted for vancomycin and zosyn dosing. WBC 11.5, LA 3.89, Scr 0.48 (this seems to be baseline). Afebrile.   Plan: Vancomycin 1000mg  IV X1, then vancomycin 750mg  IV every 12 hours  Zosyn 3.375g IV X1 (30 minute infusion), then zosyn 3.375g IV every 8 hours (4 hour infusion) Monitor for Scr, c/s, clinical resolution. F/u de-escalation plan/LOT, vancomycin trough as indicated    Temp (24hrs), Avg:97.9 F (36.6 C), Min:97.9 F (36.6 C), Max:97.9 F (36.6 C)   Recent Labs Lab 01/02/17 1350 01/02/17 1401  WBC 11.5*  --   CREATININE 0.48  --   LATICACIDVEN  --  3.89*    Estimated Creatinine Clearance: 70.4 mL/min (by C-G formula based on SCr of 0.48 mg/dL).    Allergies  Allergen Reactions  . Codeine Hives  . Morphine And Related Hives  . Reglan [Metoclopramide] Other (See Comments)    EPS symptoms     Antimicrobials this admission: 10/18 vancomycin >>  10/18 Zosyn >>    Thank you for allowing pharmacy to be a part of this patient's care.  Jerrye Noble, PharmD Candidate  01/02/2017 5:45 PM

## 2017-01-02 NOTE — ED Provider Notes (Signed)
[facility] EMERGENCY DEPARTMENT Provider Note   CSN: [id number] Arrival date & time: 01/02/17  1335     History   Chief Complaint Chief Complaint  Patient presents with  . Abdominal Pain  . Cellulitis  . Drainage from Incision    HPI Angel Price is a 51 y.o. female.  HPI  51 year old female presents with abdominal pain for 1 week. He noticed a mass under her right upper quadrant abdominal skin and today noticed it was red. She's not sure if the redness started today but it is the first time she noticed it. She also has chronic lower abdominal wounds that have had chronic drainage since 2014. However now it is draining with a foul-smelling discharge. It is a little darker than normal. No fevers. No vomiting but has had some nausea. Pain is currently severe. She has had cough and shortness of breath x 1 week as well.  Past Medical History:  Diagnosis Date  . Abscess    soft tissue  . Adrenal mass ([place])   . Alcohol abuse   . Anxiety   . Blood transfusion without reported diagnosis   . Chronic abdominal pain   . Chronic wound infection of abdomen   . Colon polyp    colonoscopy 04/2014  . Depression   . Diverticulosis    colonoscopy 04/2014 moderat pan colonic  . Gastritis    EGD 05/2014  . Gastroparesis Nov 2015  . GERD (gastroesophageal reflux disease)   . Hemorrhoid    internal large  . Hiatal hernia   . History of Billroth II operation   . Hypertension   . Lung nodule    CT 02/2014 needs repeat 1 month  . Lung nodule < 6cm on CT 04/25/2014  . Lupus   . Nausea and vomiting    chronic, recurrent  . Pancreatitis   . Schatzki's ring    patent per EGD 04/2014  . Sickle cell trait ([place])   . Suicide attempt ([place])   . 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 Cellulitis of abdominal wall 01/02/2017  . Acute left hemiparesis ([place]) 12/05/2016  . Malingering 12/05/2016  . Weakness of  left lower extremity 12/02/2016  . CVA (cerebral vascular accident) ([place]) 11/23/2016  . Bright red rectal bleeding 08/22/2016  . Hypotension due to blood loss   . Acute GI bleeding 05/24/2016  . Dieulafoy lesion of duodenum   . History of Billroth II operation   . Gastrointestinal hemorrhage 05/22/2016  . Absolute anemia   . Pancreatitis, acute   . Acute pancreatitis 10/01/2015  . Abnormal CT scan of lung 10/01/2015  . Alcohol intoxication ([place])   . Left-sided weakness   . Psychosomatic factor in physical condition   . Upper GI bleed   . Diverticulosis of colon with hemorrhage   . Chronic wound infection of abdomen 12/22/2014  . Thyroid disease 12/22/2014  . Hypertension 12/22/2014  . Ataxia 11/01/2014  . Hemorrhoids 04/26/2014  . Lung nodule 04/26/2014  . Diverticulosis   . Gastritis   . Hiatal hernia   . Schatzki's ring   . Acute blood loss anemia 04/25/2014  . Sinus tachycardia 04/25/2014  . Hypokalemia 04/25/2014  . Hyponatremia 04/25/2014  . Hematemesis with nausea 04/25/2014  . Lung nodule < 6cm on CT 04/25/2014  . Intractable nausea and vomiting 04/24/2014  . Gastroparesis   . Abdominal pain 04/01/2014  . Chronic  abdominal pain 01/21/2014  . Gastroenteritis 12/10/2013  . Chronic abdominal wound infection 08/16/2013  . MDD (major depressive disorder), recurrent episode, severe ([place]) 06/27/2013  . Wrist laceration 06/24/2013  . Nausea with vomiting 05/07/2013  . Diarrhea 05/07/2013  . Rectal bleeding 05/07/2013  . Abnormal LFTs 05/07/2013  . Adrenal mass, left ([place]) 05/07/2013  . Abdominal wall abscess at site of surgical wound 04/19/2013  . Abdominal wall abscess 04/18/2013  . Frequent headaches 03/30/2013  . Sleep difficulties 03/30/2013  . Essential hypertension, benign 03/30/2013  . History of cocaine abuse 03/16/2013  . History of schizoaffective disorder 03/16/2013  . Bipolar disorder ([place]) 03/16/2013  . Personality disorder ([place]) 03/16/2013  .  Tobacco abuse 03/16/2013  . Alcohol abuse 03/16/2013  . Palpitations 03/16/2013  . Poor vision 03/16/2013  . History of gastric bypass 03/16/2013  . Status post hysterectomy with oophorectomy 03/16/2013  . Hypothyroid 03/16/2013  . Lupus ([place]) 03/16/2013    Past Surgical History:  Procedure Laterality Date  . ABDOMINAL HYSTERECTOMY  2013   [place]  . ABDOMINAL SURGERY    . ADRENALECTOMY Right   . AGILE CAPSULE N/A 01/05/2015   Procedure: AGILE CAPSULE;  Surgeon: Daneil Dolin, MD;  Location: [facility];  Service: Endoscopy;  Laterality: N/A;  0700  . Billroth II procedure      [place], first 2000, 2005/2006.  Marland Kitchen BIOPSY  05/20/2013   Procedure: BIOPSIES OF ASCENDING AND SIGMOID COLON;  Surgeon: Daneil Dolin, MD;  Location: [facility] ORS;  Service: Endoscopy;;  . BIOPSY  04/26/2014   Procedure: BIOPSIES;  Surgeon: Danie Binder, MD;  Location: [facility] OR[facility];  Service: Endoscopy;;  . CHOLECYSTECTOMY    . COLONOSCOPY     in [place]  . COLONOSCOPY WITH PROPOFOL N/A 05/20/2013   Dr.Rourk- inadequate prep, normal appearing rectum, grossly normal colon aside from pancolonic diverticula, normal terminal ileum bx= unremarkable colonic mucosa. Due for early interval 2016.   Marland Kitchen COLONOSCOPY WITH PROPOFOL N/A 04/26/2014   FMB:[id number] ileum/one colon polyp removed/moderate pan-colonic diverticulosis/large internal hemorrhoids  . COLONOSCOPY WITH PROPOFOL N/A 12/23/2014   Dr.Rourk- minimal internal hemorrhoids, pancolonic diverticulosis  . COLONOSCOPY WITH PROPOFOL N/A 08/23/2016   Procedure: COLONOSCOPY WITH PROPOFOL;  Surgeon: Danie Binder, MD;  Location: [facility];  Service: Endoscopy;  Laterality: N/A;  . DEBRIDEMENT OF ABDOMINAL WALL ABSCESS N/A 02/08/2013   Procedure: DEBRIDEMENT OF ABDOMINAL WALL ABSCESS;  Surgeon: Jamesetta So, MD;  Location: [facility] OR[facility];  Service: General;  Laterality: N/A;  . ESOPHAGOGASTRODUODENOSCOPY (EGD) WITH PROPOFOL N/A 05/20/2013   Dr.Rourk- s/p prior gastric surgery with  normal esophagus, residual gastric mucosa and patent efferent limb  . ESOPHAGOGASTRODUODENOSCOPY (EGD) WITH PROPOFOL N/A 02/03/2014   Dr. Gala Romney:  s/p hemigastrectomy with retained gastric contents. Residual gastric mucosa and efferent limb appeared normal otherwise. Query gastroparesis.   Marland Kitchen ESOPHAGOGASTRODUODENOSCOPY (EGD) WITH PROPOFOL N/A 04/26/2014   DJT:[id number] ring/small HH/mild non-erosive gasrtitis/normal anastomosis  . ESOPHAGOGASTRODUODENOSCOPY (EGD) WITH PROPOFOL N/A 12/23/2014   Dr.Rourk- s/p prior hemigastrctomy, active oozing from anastomotic suture site, hemostasis achieved  . ESOPHAGOGASTRODUODENOSCOPY (EGD) WITH PROPOFOL N/A 05/23/2016   Dr. 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 tendon repar Right    wrist  . WOUND EXPLORATION Right 06/24/2013   Procedure: exploration of traumatic wound right wrist;  Surgeon: Tennis Must, MD;  Location: [facility];  Service: Orthopedics;  Laterality: Right;    OB History  Gravida Para Term Preterm AB Living   4 4 4     3    SAB TAB Ectopic Multiple Live Births                   Home Medications    Prior to Admission medications   Medication Sig Start Date End Date Taking? Authorizing Provider  amLODipine (NORVASC) 5 MG tablet Take 5 mg by mouth daily.   Yes [provider]  aspirin EC 81 MG EC tablet Take 2 tablets (162 mg total) by mouth daily. 11/27/16  Yes Reyne Dumas, MD  Cyanocobalamin (VITAMIN B-12) 2500 MCG TABS Take 5,000 mcg by mouth daily. 11/26/16  Yes Reyne Dumas, MD  lisinopril (PRINIVIL,ZESTRIL) 20 MG tablet Take 1 tablet (20 mg total) by mouth daily. 11/26/16 11/26/17 Yes Reyne Dumas, MD  pantoprazole (PROTONIX) 20 MG tablet Take 1 tablet (20 mg total) by mouth daily. 09/23/16  Yes Francine Graven, DO  polyethylene glycol (MIRALAX / GLYCOLAX) packet Take 17 g by mouth daily as needed for moderate constipation or  severe constipation.   Yes [provider]  zolpidem (AMBIEN) 5 MG tablet Take 5 mg by mouth at bedtime as needed for sleep.   Yes [provider]    Family History Family History  Problem Relation Age of Onset  . Brain cancer Son   . Schizophrenia Son   . Cancer Son        brain  . Lung cancer Father   . Cancer Father        mets  . Drug abuse Mother   . Breast cancer Maternal Aunt   . Bipolar disorder Maternal Aunt   . Drug abuse Maternal Aunt   . Colon cancer Maternal Grandmother        late 73s, early 66s  . Drug abuse Sister   . Drug abuse Brother   . Bipolar disorder Paternal Grandfather   . Bipolar disorder Cousin   . Liver disease Neg Hx     Social History Social History  Substance Use Topics  . Smoking status: Current Every Day Smoker    Packs/day: 0.50    Years: 35.00    Types: Cigarettes  . Smokeless tobacco: Never Used  . Alcohol use No     Comment: has had a beer yesterday     Allergies   Codeine; Morphine and related; and Reglan [metoclopramide]   Review of Systems Review of Systems  Constitutional: Negative for fever.  Gastrointestinal: Positive for abdominal pain and nausea. Negative for diarrhea and vomiting.  Genitourinary: Negative for dysuria.  Skin: Positive for color change and wound.  All other systems reviewed and are negative.    Physical Exam Updated Vital Signs BP (!) 145/99   Pulse (!) 102   Temp 97.9 F (36.6 C) (Oral)   Resp (!) 24   Ht 5\' 2"  (1.575 m)   Wt 62.6 kg (138 lb)   SpO2 97%   BMI 25.24 kg/m   Physical Exam  Constitutional: She is oriented to person, place, and time. She appears well-developed and well-nourished.  HENT:  Head: Normocephalic and atraumatic.  Right Ear: External ear normal.  Left Ear: External ear normal.  Nose: Nose normal.  Eyes: Right eye exhibits no discharge. Left eye exhibits no discharge.  Cardiovascular: Normal rate, regular rhythm and normal heart sounds.     Pulmonary/Chest: Effort normal and breath sounds normal.  Abdominal: Soft. There is tenderness.    Soft abd besides RUQ  mass/tenderness. Otherwise no tenderness. 2 small wounds in her lower abdomen, have small amounts of purulent drainage.   Neurological: She is alert and oriented to person, place, and time.  Skin: Skin is warm and dry.  Nursing note and vitals reviewed.    ED Treatments / Results  Labs (all labs ordered are listed, but only abnormal results are displayed) Labs Reviewed  COMPREHENSIVE METABOLIC PANEL - Abnormal; Notable for the following:       Result Value   Potassium 2.7 (*)    CO2 17 (*)    Glucose, Bld 228 (*)    Calcium 8.8 (*)    Albumin 3.3 (*)    All other components within normal limits  CBC WITH DIFFERENTIAL/PLATELET - Abnormal; Notable for the following:    WBC 11.5 (*)    Hemoglobin 10.9 (*)    HCT 31.3 (*)    MCV 75.1 (*)    RDW 17.0 (*)    Platelets 457 (*)    Lymphs Abs 4.6 (*)    All other components within normal limits  URINALYSIS, ROUTINE W REFLEX MICROSCOPIC - Abnormal; Notable for the following:    Color, Urine STRAW (*)    Leukocytes, UA TRACE (*)    Squamous Epithelial / LPF 0-5 (*)    All other components within normal limits  I-STAT CG4 LACTIC ACID, ED - Abnormal; Notable for the following:    Lactic Acid, Venous 3.89 (*)    All other components within normal limits  CULTURE, BLOOD (ROUTINE X 2)  CULTURE, BLOOD (ROUTINE X 2)  LIPASE, BLOOD  CBC  BASIC METABOLIC PANEL  I-STAT CG4 LACTIC ACID, ED    EKG  EKG Interpretation None       Radiology Dg Chest 2 View  Result Date: 01/02/2017 CLINICAL DATA:  Shortness of breath, weakness, and chest pain. Painful knot in the abdomen. Current smoker. EXAM: CHEST  2 VIEW COMPARISON:  Chest x-ray of September 23, 2016 FINDINGS: The lungs are adequately inflated and clear. The heart and pulmonary vascularity are normal. The mediastinum is normal in width. There is no pleural effusion.  The bony thorax exhibits no acute abnormality. IMPRESSION: There is no acute cardiopulmonary abnormality. Electronically Signed   By: David  Martinique M.D.   On: 01/02/2017 15:09   Ct Abdomen Pelvis W Contrast  Result Date: 01/02/2017 CLINICAL DATA:  Abdominal pain and fever. EXAM: CT ABDOMEN AND PELVIS WITH CONTRAST TECHNIQUE: Multidetector CT imaging of the abdomen and pelvis was performed using the standard protocol following bolus administration of intravenous contrast. CONTRAST:  123mL ISOVUE-300 IOPAMIDOL (ISOVUE-300) INJECTION 61% COMPARISON:  CT abdomen pelvis dated November 25, 2016. FINDINGS: Lower chest: No acute abnormality. Hepatobiliary: No focal liver abnormality is seen. Status post cholecystectomy. Mild biliary dilatation is unchanged, and likely related to post cholecystectomy state. Pancreas: Atrophy. No pancreatic ductal dilatation or surrounding inflammatory changes. Spleen: Normal in size without focal abnormality. Adrenals/Urinary Tract: The right adrenal gland is surgically absent. Unchanged left adrenal adenoma. The kidneys are unremarkable. No hydronephrosis or renal calculi. Mild fullness of the left distal ureter. The bladder is unremarkable. 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 No evidence of bowel wall thickening, distention, or surrounding inflammatory changes. Vascular/Lymphatic: Aortic atherosclerosis. No enlarged abdominal or pelvic lymph nodes. Reproductive: Status post hysterectomy. No adnexal masses. Other: Scarring of the anterior abdominal wall is again noted, with increased somewhat nodular inflammatory stranding in the right paramidline mid abdominal wall. No free fluid or pneumoperitoneum. Musculoskeletal: Unchanged healing  left superior and inferior pubic rami fractures. No new fracture. IMPRESSION: 1. Increased somewhat nodular inflammatory stranding in the right paramidline mid abdominal wall at the site of prior surgery, which could  reflect cellulitis. No definite drainable fluid collection. 2. Mild fullness of the left distal ureter without evidence of obstructing calculus. 3.  Aortic atherosclerosis (ICD10-I70.0). 4. Unchanged healing left superior and inferior pubic rami fractures. Electronically Signed   By: Titus Dubin M.D.   On: 01/02/2017 18:48    Procedures Procedures (including critical care time)  Medications Ordered in ED Medications  vancomycin (VANCOCIN) IVPB 750 mg/150 ml premix (not administered)  acetaminophen (TYLENOL) tablet 650 mg (not administered)    Or  acetaminophen (TYLENOL) suppository 650 mg (not administered)  ondansetron (ZOFRAN) tablet 4 mg (not administered)    Or  ondansetron (ZOFRAN) injection 4 mg (not administered)  enoxaparin (LOVENOX) injection 40 mg (not administered)  ketorolac (TORADOL) 30 MG/ML injection 30 mg (not administered)  amLODipine (NORVASC) tablet 5 mg (not administered)  lisinopril (PRINIVIL,ZESTRIL) tablet 20 mg (not administered)  polyethylene glycol (MIRALAX / GLYCOLAX) packet 17 g (not administered)  pantoprazole (PROTONIX) EC tablet 20 mg (not administered)  zolpidem (AMBIEN) tablet 5 mg (not administered)  potassium chloride 10 mEq in 100 mL IVPB (not administered)  aspirin EC tablet 162 mg (not administered)  piperacillin-tazobactam (ZOSYN) IVPB 3.375 g (0 g Intravenous Stopped 01/02/17 1825)  vancomycin (VANCOCIN) IVPB 1000 mg/200 mL premix (0 mg Intravenous Stopped 01/02/17 1855)  fentaNYL (SUBLIMAZE) injection 100 mcg (100 mcg Intravenous Given 01/02/17 1751)  sodium chloride 0.9 % bolus 1,000 mL (0 mLs Intravenous Stopped 01/02/17 1836)  sodium chloride 0.9 % bolus 1,000 mL (1,000 mLs Intravenous New Bag/Given 01/02/17 1758)  potassium chloride SA (K-DUR,KLOR-CON) CR tablet 40 mEq (40 mEq Oral Given 01/02/17 1751)  iopamidol (ISOVUE-300) 61 % injection (100 mLs  Contrast Given 01/02/17 1819)  fentaNYL (SUBLIMAZE) injection 150 mcg (150 mcg  Intravenous Given 01/02/17 1919)     Initial Impression / Assessment and Plan / ED Course  I have reviewed the triage vital signs and the nursing notes.  Pertinent labs & imaging results that were available during my care of the patient were reviewed by me and considered in my medical decision making (see chart for details).     Patient's workup shows this RUQ abdominal wall changes are cellulitis. No distress or hypotension. While she has the chronic lower abdominal wounds, this appears to be new. No abscess on CT or bedside u/s by me. Given elevated WBC and lactic acid (3.8), she meets criteria for sepsis but not septic shock. Given IV fluids, broad antibiotics prior to CT and will need admission for antibiotics, fluids and supportive care. Now that CT shows no intra-abdominal source, she can probably have her antibiotics cut back from vanc/zosyn.  Final Clinical Impressions(s) / ED Diagnoses   Final diagnoses:  Abdominal wall cellulitis    New Prescriptions New Prescriptions   No medications on file     Sherwood Gambler, MD 01/02/17 2014

## 2017-01-03 ENCOUNTER — Encounter (HOSPITAL_COMMUNITY): Payer: Self-pay | Admitting: *Deleted

## 2017-01-03 DIAGNOSIS — L03311 Cellulitis of abdominal wall: Secondary | ICD-10-CM | POA: Diagnosis not present

## 2017-01-03 LAB — CBC
HCT: 26.9 % — ABNORMAL LOW (ref 36.0–46.0)
HEMOGLOBIN: 9.1 g/dL — AB (ref 12.0–15.0)
MCH: 25.3 pg — AB (ref 26.0–34.0)
MCHC: 33.8 g/dL (ref 30.0–36.0)
MCV: 74.7 fL — AB (ref 78.0–100.0)
Platelets: 412 10*3/uL — ABNORMAL HIGH (ref 150–400)
RBC: 3.6 MIL/uL — AB (ref 3.87–5.11)
RDW: 17 % — ABNORMAL HIGH (ref 11.5–15.5)
WBC: 8.1 10*3/uL (ref 4.0–10.5)

## 2017-01-03 LAB — BASIC METABOLIC PANEL
Anion gap: 7 (ref 5–15)
BUN: 7 mg/dL (ref 6–20)
CHLORIDE: 110 mmol/L (ref 101–111)
CO2: 22 mmol/L (ref 22–32)
CREATININE: 0.48 mg/dL (ref 0.44–1.00)
Calcium: 8.7 mg/dL — ABNORMAL LOW (ref 8.9–10.3)
GFR calc Af Amer: >60 mL/min (ref >60)
GFR calc non Af Amer: >60 mL/min (ref >60)
GLUCOSE: 81 mg/dL (ref 65–99)
POTASSIUM: 3.7 mmol/L (ref 3.5–5.1)
SODIUM: 139 mmol/L (ref 135–145)

## 2017-01-03 MED ORDER — HYDROCODONE-ACETAMINOPHEN 7.5-325 MG/15ML PO SOLN
10.0000 mL | Freq: Once | ORAL | Status: AC
  Administered 2017-01-03: 10 mL via ORAL
  Filled 2017-01-03: qty 15

## 2017-01-03 MED ORDER — DOXYCYCLINE HYCLATE 50 MG PO CAPS: 50.0000 mg | ORAL_CAPSULE | Freq: Two times a day (BID) | ORAL | 0 refills | Status: AC

## 2017-01-03 MED ORDER — HYDROCODONE-ACETAMINOPHEN 5-325 MG PO TABS: 1.0000 | ORAL_TABLET | ORAL | 0 refills | Status: DC | PRN

## 2017-01-03 MED ORDER — AMLODIPINE BESYLATE 10 MG PO TABS: 5.0000 mg | ORAL_TABLET | Freq: Every day | ORAL | 0 refills | Status: DC

## 2017-01-03 NOTE — Care Management Obs Status (Signed)
[facility] NOTIFICATION   Patient Details  Name: Angel Price MRN: [id number] Date of Birth: 04/29/1965   Medicare Observation Status Notification Given:  Yes    Carles Collet, RN 01/03/2017, 1:16 PM

## 2017-01-03 NOTE — Discharge Summary (Signed)
Physician Discharge Summary  Angel Price  WUJ:[id number]  DOB: [birth year]  DOA: 01/02/2017 PCP: Rosita Fire, MD  Admit date: 01/02/2017 Discharge date: 01/03/2017  Admitted From: Home  Disposition:  Home   Recommendations for Outpatient Follow-up:  1. Follow up with PCP in 1-2 weeks 2. Please obtain BMP/CBC in one week to monitor renal function and hemoglobin 3. Please follow up on the following pending results: Final blood cultures  Discharge Condition: Stable CODE STATUS: Full code Diet recommendation: Heart Healthy   Brief/Interim Summary: For details see H&P but in brief, 51 year old female with medical history significant for abdominal surgery, chronic abdominal pain and recurrent cellulitis of the abdominal wall presented to the ED with one-week history of abdominal pain and red rash.  Patient was found to have abdominal wall cells cellulitis in the ED.  CT shows only superficial cellulitis with abscess.  Patient was treated with vancomycin and Zosyn and was given a dose of fentanyl for pain.  Patient will be discharged on doxycycline for 7 days, with some pain medication.  Labs unremarkable and lactic acid has normalized.  Cellulitis area was marked, patient advised that if increasing redness passing when area is marked to come back to the ED.  Subjective: Patient seen and examined she is complaining of abdominal pain on the area of the infectious processes.  Otherwise no other complaints.  Patient has been afebrile, tolerating diet and ambulating with no difficulty.  Discharge Diagnoses/Hospital Course:  Cellulitis of abdominal wall Clinically mild, was treated with vancomycin and Zosyn. Given IV fluids.  Will discharge patient on doxycycline for 7 days. Cellulitis area was marked, patient advised that if increasing redness passing when area is marked to come back to the ED. Prescription for percocet given for 3 days PRN Patient advised to follow-up with PCP Blood  cultures negative thus far  Chronic abdominal pain Has had an extensive workup with no significant results. Need to see pain management as an outpatient  Hypertension BP slightly above goal Norvasc adjusted increased to 10 mg Continue lisinopril at current dose Follow-up with PCP  All other chronic medical condition were stable during the hospitalization.  On the day of the discharge the patient's vitals were stable, and no other acute medical condition were reported by patient. the patient was felt safe to be discharge to home  Discharge Instructions  You were cared for by a hospitalist during your hospital stay. If you have any questions about your discharge medications or the care you received while you were in the hospital after you are discharged, you can call the unit and asked to speak with the hospitalist on call if the hospitalist that took care of you is not available. Once you are discharged, your primary care physician will handle any further medical issues. Please note that NO REFILLS for any discharge medications will be authorized once you are discharged, as it is imperative that you return to your primary care physician (or establish a relationship with a primary care physician if you do not have one) for your aftercare needs so that they can reassess your need for medications and monitor your lab values.  Discharge Instructions    Call MD for:  difficulty breathing, headache or visual disturbances    Complete by:  As directed    Call MD for:  extreme fatigue    Complete by:  As directed    Call MD for:  hives    Complete by:  As directed  Call MD for:  persistant dizziness or light-headedness    Complete by:  As directed    Call MD for:  persistant nausea and vomiting    Complete by:  As directed    Call MD for:  redness, tenderness, or signs of infection (pain, swelling, redness, odor or green/yellow discharge around incision site)    Complete by:  As directed     Call MD for:  severe uncontrolled pain    Complete by:  As directed    Call MD for:  temperature >100.4    Complete by:  As directed    Diet - low sodium heart healthy    Complete by:  As directed    Increase activity slowly    Complete by:  As directed      Allergies as of 01/03/2017      Reactions   Codeine Hives   Morphine And Related Hives   Reglan [metoclopramide] Other (See Comments)   EPS symptoms       Medication List    TAKE these medications   amLODipine 10 MG tablet Commonly known as:  NORVASC Take 0.5 tablets (5 mg total) by mouth daily. What changed:  medication strength   aspirin 81 MG EC tablet Take 2 tablets (162 mg total) by mouth daily.   B-12 2500 MCG Tabs Take 5,000 mcg by mouth daily.   doxycycline 50 MG capsule Commonly known as:  VIBRAMYCIN Take 1 capsule (50 mg total) by mouth 2 (two) times daily.   HYDROcodone-acetaminophen 5-325 MG tablet Commonly known as:  NORCO Take 1 tablet by mouth every 4 (four) hours as needed for moderate pain.   lisinopril 20 MG tablet Commonly known as:  PRINIVIL,ZESTRIL Take 1 tablet (20 mg total) by mouth daily.   pantoprazole 20 MG tablet Commonly known as:  PROTONIX Take 1 tablet (20 mg total) by mouth daily.   polyethylene glycol packet Commonly known as:  MIRALAX / GLYCOLAX Take 17 g by mouth daily as needed for moderate constipation or severe constipation.   zolpidem 5 MG tablet Commonly known as:  AMBIEN Take 5 mg by mouth at bedtime as needed for sleep.      Follow-up Information    Rosita Fire, MD. Schedule an appointment as soon as possible for a visit in 1 week(s).   Specialty:  Internal Medicine Why:  Hospital follow up  Contact information: [address]  [place] [address] [phone]          Allergies  Allergen Reactions  . Codeine Hives  . Morphine And Related Hives  . Reglan [Metoclopramide] Other (See Comments)    EPS symptoms      Consultations:  None   Procedures/Studies: Ct Angio Head W Or Wo Contrast  Result Date: 12/20/2016 CLINICAL DATA:  Left-sided weakness.  Follow-up stroke. EXAM: CT ANGIOGRAPHY HEAD AND NECK TECHNIQUE: Multidetector CT imaging of the head and neck was performed using the standard protocol during bolus administration of intravenous contrast. Multiplanar CT image reconstructions and MIPs were obtained to evaluate the vascular anatomy. Carotid stenosis measurements (when applicable) are obtained utilizing NASCET criteria, using the distal internal carotid diameter as the denominator. CONTRAST:  75 cc Isovue 370 COMPARISON:  MRI 12/04/2016 FINDINGS: CT HEAD FINDINGS Brain: No evidence of malformation, atrophy, old or acute small or large vessel infarction, mass lesion, hemorrhage, hydrocephalus or extra-axial collection. No evidence of pituitary lesion. Vascular: No vascular calcification.  No hyperdense vessels. Skull: Normal.  No fracture or focal bone  lesion. Sinuses/Orbits: Visualized sinuses are clear. No fluid in the middle ears or mastoids. Visualized orbits are normal. Other: None significant CTA NECK FINDINGS Aortic arch: Minimal atherosclerotic calcification at the arch. No aneurysm or dissection. Branching pattern of the brachiocephalic vessels is normal without stenosis. Right carotid system: Common carotid artery widely patent to the bifurcation. Carotid bifurcation is normal without atherosclerotic plaque or irregularity. Cervical internal carotid artery widely patent. Left carotid system: Common carotid artery widely patent to the bifurcation. Carotid bifurcation is normal without atherosclerotic plaque or irregularity. Cervical internal carotid artery is widely patent. Vertebral arteries: Both vertebral artery origins are widely patent. The vertebral arteries are approximately equal in size an widely patent through the cervical region. Skeleton: Spondylosis C5-6. Other neck: No mass or  lymphadenopathy. Upper chest: No abnormality seen on the left. On the right, there is volume loss in the upper lobe, similar to the chest radiograph of 10/21/2016. Follow-up to clearing suggested. Review of the MIP images confirms the above findings CTA HEAD FINDINGS Anterior circulation: Both internal carotid arteries are patent through the skullbase and siphon regions. The anterior and middle cerebral vessels are patent without proximal stenosis, aneurysm or vascular malformation. Posterior circulation: Both vertebral arteries are widely patent to the basilar. No basilar stenosis. Posterior circulation branch vessels are normal. Venous sinuses: Patent and normal. Anatomic variants: None Delayed phase: No abnormal enhancement Review of the MIP images confirms the above findings IMPRESSION: Minimal atherosclerosis of the aortic arch. Normal appearance of the brachiocephalic and intracranial vessels. No atherosclerotic plaque or regularity. Normal appearance of the brain. Focal density in the right upper lobe presumed to represent persistent atelectasis. Follow-up to clearing suggested subsequently. Electronically Signed   By: Nelson Chimes M.D.   On: 12/20/2016 12:35   Dg Chest 2 View  Result Date: 01/02/2017 CLINICAL DATA:  Shortness of breath, weakness, and chest pain. Painful knot in the abdomen. Current smoker. EXAM: CHEST  2 VIEW COMPARISON:  Chest x-ray of September 23, 2016 FINDINGS: The lungs are adequately inflated and clear. The heart and pulmonary vascularity are normal. The mediastinum is normal in width. There is no pleural effusion. The bony thorax exhibits no acute abnormality. IMPRESSION: There is no acute cardiopulmonary abnormality. Electronically Signed   By: David  Martinique M.D.   On: 01/02/2017 15:09   Dg Thoracic Spine 2 View  Result Date: 12/22/2016 CLINICAL DATA:  Seizure.  Back pain. EXAM: THORACIC SPINE 2 VIEWS COMPARISON:  10/21/2016 FINDINGS: normal alignment. No fracture. No degenerative  change or focal lesion. IMPRESSION: Negative. Electronically Signed   By: Nelson Chimes M.D.   On: 12/22/2016 08:10   Dg Lumbar Spine Complete  Result Date: 12/22/2016 CLINICAL DATA:  Seizure.  Back pain. EXAM: LUMBAR SPINE - COMPLETE 4+ VIEW COMPARISON:  CT 11/25/2016 FINDINGS: There is no evidence of lumbar spine fracture. Alignment is normal. Intervertebral disc spaces are maintained. IMPRESSION: Normal.  No traumatic finding. Electronically Signed   By: Nelson Chimes M.D.   On: 12/22/2016 08:11   Ct Head Wo Contrast  Result Date: 12/22/2016 CLINICAL DATA:  Seizure, altered mental status EXAM: CT HEAD WITHOUT CONTRAST TECHNIQUE: Contiguous axial images were obtained from the base of the skull through the vertex without intravenous contrast. COMPARISON:  Brain MRI 12/20/2016 FINDINGS: Brain: No mass lesion, intraparenchymal hemorrhage or extra-axial collection. No evidence of acute cortical infarct. Brain parenchyma and CSF-containing spaces are normal for age. Vascular: No hyperdense vessel or unexpected calcification. Skull: Normal visualized skull base, calvarium and extracranial  soft tissues. Sinuses/Orbits: No sinus fluid levels or advanced mucosal thickening. No mastoid effusion. Normal orbits. IMPRESSION: Normal head CT. Electronically Signed   By: Ulyses Jarred M.D.   On: 12/22/2016 01:13   Ct Angio Neck W Or Wo Contrast  Result Date: 12/20/2016 CLINICAL DATA:  Left-sided weakness.  Follow-up stroke. EXAM: CT ANGIOGRAPHY HEAD AND NECK TECHNIQUE: Multidetector CT imaging of the head and neck was performed using the standard protocol during bolus administration of intravenous contrast. Multiplanar CT image reconstructions and MIPs were obtained to evaluate the vascular anatomy. Carotid stenosis measurements (when applicable) are obtained utilizing NASCET criteria, using the distal internal carotid diameter as the denominator. CONTRAST:  75 cc Isovue 370 COMPARISON:  MRI 12/04/2016 FINDINGS: CT HEAD  FINDINGS Brain: No evidence of malformation, atrophy, old or acute small or large vessel infarction, mass lesion, hemorrhage, hydrocephalus or extra-axial collection. No evidence of pituitary lesion. Vascular: No vascular calcification.  No hyperdense vessels. Skull: Normal.  No fracture or focal bone lesion. Sinuses/Orbits: Visualized sinuses are clear. No fluid in the middle ears or mastoids. Visualized orbits are normal. Other: None significant CTA NECK FINDINGS Aortic arch: Minimal atherosclerotic calcification at the arch. No aneurysm or dissection. Branching pattern of the brachiocephalic vessels is normal without stenosis. Right carotid system: Common carotid artery widely patent to the bifurcation. Carotid bifurcation is normal without atherosclerotic plaque or irregularity. Cervical internal carotid artery widely patent. Left carotid system: Common carotid artery widely patent to the bifurcation. Carotid bifurcation is normal without atherosclerotic plaque or irregularity. Cervical internal carotid artery is widely patent. Vertebral arteries: Both vertebral artery origins are widely patent. The vertebral arteries are approximately equal in size an widely patent through the cervical region. Skeleton: Spondylosis C5-6. Other neck: No mass or lymphadenopathy. Upper chest: No abnormality seen on the left. On the right, there is volume loss in the upper lobe, similar to the chest radiograph of 10/21/2016. Follow-up to clearing suggested. Review of the MIP images confirms the above findings CTA HEAD FINDINGS Anterior circulation: Both internal carotid arteries are patent through the skullbase and siphon regions. The anterior and middle cerebral vessels are patent without proximal stenosis, aneurysm or vascular malformation. Posterior circulation: Both vertebral arteries are widely patent to the basilar. No basilar stenosis. Posterior circulation branch vessels are normal. Venous sinuses: Patent and normal.  Anatomic variants: None Delayed phase: No abnormal enhancement Review of the MIP images confirms the above findings IMPRESSION: Minimal atherosclerosis of the aortic arch. Normal appearance of the brachiocephalic and intracranial vessels. No atherosclerotic plaque or regularity. Normal appearance of the brain. Focal density in the right upper lobe presumed to represent persistent atelectasis. Follow-up to clearing suggested subsequently. Electronically Signed   By: Nelson Chimes M.D.   On: 12/20/2016 12:35   Mr Brain Wo Contrast  Result Date: 12/20/2016 CLINICAL DATA:  Left lower extremity weakness for 2 months EXAM: MRI HEAD WITHOUT CONTRAST TECHNIQUE: Multiplanar, multiecho pulse sequences of the brain and surrounding structures were obtained without intravenous contrast. COMPARISON:  12/03/2016 FINDINGS: Brain: No acute infarction, hemorrhage, hydrocephalus, extra-axial collection or mass lesion. Partially empty sella, chronic and incidental in this setting. Vascular: Major flow voids are preserved. Skull and upper cervical spine: Negative for marrow lesion. Sinuses/Orbits: Negative IMPRESSION: Stable and negative brain MRI. Electronically Signed   By: Monte Fantasia M.D.   On: 12/20/2016 14:10   Mr Jeri Cos PJ Contrast  Result Date: 12/04/2016 CLINICAL DATA:  Initial evaluation for acute ataxia for 2 days. Evaluate for stroke.  EXAM: MRI HEAD WITHOUT AND WITH CONTRAST MRI CERVICAL SPINE WITHOUT AND WITH CONTRAST TECHNIQUE: Multiplanar, multiecho pulse sequences of the brain and surrounding structures, and cervical spine, to include the craniocervical junction and cervicothoracic junction, were obtained without and with intravenous contrast. CONTRAST:  51mL MULTIHANCE GADOBENATE DIMEGLUMINE 529 MG/ML IV SOLN COMPARISON:  Prior CT and MRI from 12/02/2016. FINDINGS: MRI HEAD FINDINGS Brain: Cerebral volume within normal limits for age. Few tiny subcentimeter T2/FLAIR hyperintensities noted within the  periventricular deep white matter, nonspecific, within normal limits for age. No abnormal foci of restricted diffusion to suggest acute or subacute ischemia. Gray-white matter differentiation maintained. No encephalomalacia to suggest chronic infarction. No susceptibility artifact to suggest acute or chronic intracranial hemorrhage. No mass lesion, midline shift or mass effect. No hydrocephalus. No extra-axial fluid collection. Major dural sinuses are patent. No abnormal enhancement. Incidental note made of an empty sella. Midline structures intact and normal. Vascular: Major intracranial vascular flow voids maintained. Skull and upper cervical spine: Craniocervical junction normal. Bone marrow signal intensity normal. No scalp soft tissue abnormality. Sinuses/Orbits: Globes and orbital soft tissues within normal limits. Paranasal sinuses are clear. Trace opacity bilateral mastoid air cells. Inner ear structures normal. Other: None. MRI CERVICAL SPINE FINDINGS Alignment: Study degraded by motion artifact. Straightening with slight reversal of the normal cervical lordosis, apex at C5-6. No listhesis. Vertebrae: Vertebral body heights maintained. No evidence for acute or chronic fracture. Bone marrow signal intensity within normal limits. No discrete or worrisome osseous lesions. No abnormal enhancement. Cord: Signal intensity within the cervical spinal cord is within normal limits. Posterior Fossa, vertebral arteries, paraspinal tissues: Craniocervical junction within normal limits. Paraspinous and prevertebral soft tissues within normal limits. Normal intravascular flow voids present within the vertebral arteries bilaterally. Disc levels: C2-C3: Shallow central disc protrusion minimally indents the ventral thecal sac. No significant canal or foraminal stenosis. C3-C4:  Minimal disc bulge.  No canal or foraminal stenosis. C4-C5:  Mild diffuse disc bulge.  No significant stenosis. C5-C6: Chronic diffuse degenerative  disc osteophyte with intervertebral disc space narrowing. Mild bilateral uncovertebral hypertrophy. Broad posterior component indents and partially faces the ventral thecal sac with resultant mild canal stenosis. Mild left C6 foraminal stenosis. No significant right foraminal narrowing. C6-C7: Central/left paracentral disc protrusion indents the ventral thecal sac. No significant cord deformity or stenosis. Foramina are widely patent. C7-T1:  Unremarkable. Visualized upper thoracic spine within normal limits. IMPRESSION: MRI HEAD IMPRESSION: 1. Stable essentially normal MRI appearance of the brain. No acute intracranial infarct or other abnormality. 2. Empty sella. MRI CERVICAL SPINE IMPRESSION: 1. No acute abnormality within the cervical spine. Normal MRI appearance of the cervical spinal cord. 2. Degenerative disc osteophyte at C5-6 with resultant mild canal and left C6 foraminal stenosis. 3. Central disc protrusion at C6-7 without significant stenosis. Electronically Signed   By: Jeannine Boga M.D.   On: 12/04/2016 21:07   Mr Cervical Spine W Wo Contrast  Result Date: 12/04/2016 CLINICAL DATA:  Initial evaluation for acute ataxia for 2 days. Evaluate for stroke. EXAM: MRI HEAD WITHOUT AND WITH CONTRAST MRI CERVICAL SPINE WITHOUT AND WITH CONTRAST TECHNIQUE: Multiplanar, multiecho pulse sequences of the brain and surrounding structures, and cervical spine, to include the craniocervical junction and cervicothoracic junction, were obtained without and with intravenous contrast. CONTRAST:  83mL MULTIHANCE GADOBENATE DIMEGLUMINE 529 MG/ML IV SOLN COMPARISON:  Prior CT and MRI from 12/02/2016. FINDINGS: MRI HEAD FINDINGS Brain: Cerebral volume within normal limits for age. Few tiny subcentimeter T2/FLAIR hyperintensities noted within  the periventricular deep white matter, nonspecific, within normal limits for age. No abnormal foci of restricted diffusion to suggest acute or subacute ischemia. Gray-white  matter differentiation maintained. No encephalomalacia to suggest chronic infarction. No susceptibility artifact to suggest acute or chronic intracranial hemorrhage. No mass lesion, midline shift or mass effect. No hydrocephalus. No extra-axial fluid collection. Major dural sinuses are patent. No abnormal enhancement. Incidental note made of an empty sella. Midline structures intact and normal. Vascular: Major intracranial vascular flow voids maintained. Skull and upper cervical spine: Craniocervical junction normal. Bone marrow signal intensity normal. No scalp soft tissue abnormality. Sinuses/Orbits: Globes and orbital soft tissues within normal limits. Paranasal sinuses are clear. Trace opacity bilateral mastoid air cells. Inner ear structures normal. Other: None. MRI CERVICAL SPINE FINDINGS Alignment: Study degraded by motion artifact. Straightening with slight reversal of the normal cervical lordosis, apex at C5-6. No listhesis. Vertebrae: Vertebral body heights maintained. No evidence for acute or chronic fracture. Bone marrow signal intensity within normal limits. No discrete or worrisome osseous lesions. No abnormal enhancement. Cord: Signal intensity within the cervical spinal cord is within normal limits. Posterior Fossa, vertebral arteries, paraspinal tissues: Craniocervical junction within normal limits. Paraspinous and prevertebral soft tissues within normal limits. Normal intravascular flow voids present within the vertebral arteries bilaterally. Disc levels: C2-C3: Shallow central disc protrusion minimally indents the ventral thecal sac. No significant canal or foraminal stenosis. C3-C4:  Minimal disc bulge.  No canal or foraminal stenosis. C4-C5:  Mild diffuse disc bulge.  No significant stenosis. C5-C6: Chronic diffuse degenerative disc osteophyte with intervertebral disc space narrowing. Mild bilateral uncovertebral hypertrophy. Broad posterior component indents and partially faces the ventral  thecal sac with resultant mild canal stenosis. Mild left C6 foraminal stenosis. No significant right foraminal narrowing. C6-C7: Central/left paracentral disc protrusion indents the ventral thecal sac. No significant cord deformity or stenosis. Foramina are widely patent. C7-T1:  Unremarkable. Visualized upper thoracic spine within normal limits. IMPRESSION: MRI HEAD IMPRESSION: 1. Stable essentially normal MRI appearance of the brain. No acute intracranial infarct or other abnormality. 2. Empty sella. MRI CERVICAL SPINE IMPRESSION: 1. No acute abnormality within the cervical spine. Normal MRI appearance of the cervical spinal cord. 2. Degenerative disc osteophyte at C5-6 with resultant mild canal and left C6 foraminal stenosis. 3. Central disc protrusion at C6-7 without significant stenosis. Electronically Signed   By: Jeannine Boga M.D.   On: 12/04/2016 21:07   Ct Abdomen Pelvis W Contrast  Result Date: 01/02/2017 CLINICAL DATA:  Abdominal pain and fever. EXAM: CT ABDOMEN AND PELVIS WITH CONTRAST TECHNIQUE: Multidetector CT imaging of the abdomen and pelvis was performed using the standard protocol following bolus administration of intravenous contrast. CONTRAST:  122mL ISOVUE-300 IOPAMIDOL (ISOVUE-300) INJECTION 61% COMPARISON:  CT abdomen pelvis dated November 25, 2016. FINDINGS: Lower chest: No acute abnormality. Hepatobiliary: No focal liver abnormality is seen. Status post cholecystectomy. Mild biliary dilatation is unchanged, and likely related to post cholecystectomy state. Pancreas: Atrophy. No pancreatic ductal dilatation or surrounding inflammatory changes. Spleen: Normal in size without focal abnormality. Adrenals/Urinary Tract: The right adrenal gland is surgically absent. Unchanged left adrenal adenoma. The kidneys are unremarkable. No hydronephrosis or renal calculi. Mild fullness of the left distal ureter. The bladder is unremarkable. 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 No evidence of bowel wall thickening, distention, or surrounding inflammatory changes. Vascular/Lymphatic: Aortic atherosclerosis. No enlarged abdominal or pelvic lymph nodes. Reproductive: Status post hysterectomy. No adnexal masses. Other: Scarring of the anterior abdominal wall is  again noted, with increased somewhat nodular inflammatory stranding in the right paramidline mid abdominal wall. No free fluid or pneumoperitoneum. Musculoskeletal: Unchanged healing left superior and inferior pubic rami fractures. No new fracture. IMPRESSION: 1. Increased somewhat nodular inflammatory stranding in the right paramidline mid abdominal wall at the site of prior surgery, which could reflect cellulitis. No definite drainable fluid collection. 2. Mild fullness of the left distal ureter without evidence of obstructing calculus. 3.  Aortic atherosclerosis (ICD10-I70.0). 4. Unchanged healing left superior and inferior pubic rami fractures. Electronically Signed   By: Titus Dubin M.D.   On: 01/02/2017 18:48    Discharge Exam: Vitals:   01/03/17 1100 01/03/17 1435  BP: (!) 153/97 (!) 160/97  Pulse: 78 77  Resp: 17 18  Temp:  98.6 F (37 C)  SpO2: 98% 100%   Vitals:   01/03/17 1000 01/03/17 1030 01/03/17 1100 01/03/17 1435  BP: 131/83 123/84 (!) 153/97 (!) 160/97  Pulse:   78 77  Resp: (!) 22 20 17 18   Temp:    98.6 F (37 C)  TempSrc:    Oral  SpO2:   98% 100%  Weight:      Height:        General: Pt is alert, awake, not in acute distress Cardiovascular: RRR, S1/S2 +, no rubs, no gallops Respiratory: CTA bilaterally, no wheezing, no rhonchi Abdominal: Soft, NT, ND, bowel sounds +, surgical scar note, RUQ erythema, no fluctuation. TTP  Extremities: no edema, no cyanosis   The results of significant diagnostics from this hospitalization (including imaging, microbiology, ancillary and laboratory) are listed below for reference.     Microbiology: Recent Results (from the past  240 hour(s))  Blood Culture (routine x 2)     Status: None (Preliminary result)   Collection Time: 01/02/17  5:38 PM  Result Value Ref Range Status   Specimen Description BLOOD BLOOD LEFT FOREARM  Final   Special Requests   Final    BOTTLES DRAWN AEROBIC AND ANAEROBIC Blood Culture adequate volume   Culture NO GROWTH < 24 HOURS  Final   Report Status PENDING  Incomplete  Blood Culture (routine x 2)     Status: None (Preliminary result)   Collection Time: 01/02/17  5:50 PM  Result Value Ref Range Status   Specimen Description BLOOD RIGHT WRIST  Final   Special Requests   Final    BOTTLES DRAWN AEROBIC AND ANAEROBIC Blood Culture adequate volume   Culture NO GROWTH < 24 HOURS  Final   Report Status PENDING  Incomplete     Labs: BNP (last 3 results) No results for input(s): BNP in the last 8760 hours. Basic Metabolic Panel:  Recent Labs Lab 01/02/17 1350 01/03/17 0354  NA 135 139  K 2.7* 3.7  CL 105 110  CO2 17* 22  GLUCOSE 228* 81  BUN 7 7  CREATININE 0.48 0.48  CALCIUM 8.8* 8.7*   Liver Function Tests:  Recent Labs Lab 01/02/17 1350  AST 23  ALT 20  ALKPHOS 121  BILITOT 0.3  PROT 7.4  ALBUMIN 3.3*    Recent Labs Lab 01/02/17 1350  LIPASE 21   No results for input(s): AMMONIA in the last 168 hours. CBC:  Recent Labs Lab 01/02/17 1350 01/03/17 0354  WBC 11.5* 8.1  NEUTROABS 6.2  --   HGB 10.9* 9.1*  HCT 31.3* 26.9*  MCV 75.1* 74.7*  PLT 457* 412*   Cardiac Enzymes: No results for input(s): CKTOTAL, CKMB, CKMBINDEX, TROPONINI in the  last 168 hours. BNP: Invalid input(s): POCBNP CBG: No results for input(s): GLUCAP in the last 168 hours. D-Dimer No results for input(s): DDIMER in the last 72 hours. Hgb A1c No results for input(s): HGBA1C in the last 72 hours. Lipid Profile No results for input(s): CHOL, HDL, LDLCALC, TRIG, CHOLHDL, LDLDIRECT in the last 72 hours. Thyroid function studies No results for input(s): TSH, T4TOTAL, T3FREE,  THYROIDAB in the last 72 hours.  Invalid input(s): FREET3 Anemia work up No results for input(s): VITAMINB12, FOLATE, FERRITIN, TIBC, IRON, RETICCTPCT in the last 72 hours. Urinalysis    Component Value Date/Time   COLORURINE STRAW (A) 01/02/2017 [place] 01/02/2017 1854   LABSPEC 1.023 01/02/2017 1854   PHURINE 6.0 01/02/2017 1854   GLUCOSEU NEGATIVE 01/02/2017 1854   HGBUR NEGATIVE 01/02/2017 [place] 01/02/2017 [place] 01/02/2017 [place] 01/02/2017 1854   UROBILINOGEN 0.2 11/26/2014 0915   NITRITE NEGATIVE 01/02/2017 1854   LEUKOCYTESUR TRACE (A) 01/02/2017 1854   Sepsis Labs Invalid input(s): PROCALCITONIN,  WBC,  LACTICIDVEN Microbiology Recent Results (from the past 240 hour(s))  Blood Culture (routine x 2)     Status: None (Preliminary result)   Collection Time: 01/02/17  5:38 PM  Result Value Ref Range Status   Specimen Description BLOOD BLOOD LEFT FOREARM  Final   Special Requests   Final    BOTTLES DRAWN AEROBIC AND ANAEROBIC Blood Culture adequate volume   Culture NO GROWTH < 24 HOURS  Final   Report Status PENDING  Incomplete  Blood Culture (routine x 2)     Status: None (Preliminary result)   Collection Time: 01/02/17  5:50 PM  Result Value Ref Range Status   Specimen Description BLOOD RIGHT WRIST  Final   Special Requests   Final    BOTTLES DRAWN AEROBIC AND ANAEROBIC Blood Culture adequate volume   Culture NO GROWTH < 24 HOURS  Final   Report Status PENDING  Incomplete    Time coordinating discharge: 25 minutes  SIGNED:  Chipper Oman, MD  [facility] 01/03/2017, 4:40 PM  Pager please text page via  [email or web address] Password [email or web address]

## 2017-01-03 NOTE — Progress Notes (Signed)
Removed IV, provided discharge education/instructions, all questions and concerns addressed, Pt not in distress, discharged home with all Pt belongings.

## 2017-01-03 NOTE — ED Notes (Signed)
Meal tray provided.

## 2017-01-04 ENCOUNTER — Inpatient Hospital Stay (HOSPITAL_COMMUNITY)
Admission: EM | Admit: 2017-01-04 | Discharge: 2017-01-07 | DRG: 983 | Disposition: A | Discharge status: Home / Self Care | Payer: Medicare HMO | Attending: Family Medicine | Admitting: Family Medicine

## 2017-01-04 ENCOUNTER — Encounter (HOSPITAL_COMMUNITY): Payer: Self-pay | Admitting: Emergency Medicine

## 2017-01-04 ENCOUNTER — Emergency Department (HOSPITAL_COMMUNITY): Payer: Medicare HMO

## 2017-01-04 DIAGNOSIS — Z980 Intestinal bypass and anastomosis status: Secondary | ICD-10-CM

## 2017-01-04 DIAGNOSIS — L0291 Cutaneous abscess, unspecified: Secondary | ICD-10-CM

## 2017-01-04 DIAGNOSIS — Z9884 Bariatric surgery status: Secondary | ICD-10-CM | POA: Diagnosis not present

## 2017-01-04 DIAGNOSIS — M329 Systemic lupus erythematosus, unspecified: Secondary | ICD-10-CM | POA: Diagnosis present

## 2017-01-04 DIAGNOSIS — Z9119 Patient's noncompliance with other medical treatment and regimen: Secondary | ICD-10-CM

## 2017-01-04 DIAGNOSIS — Z808 Family history of malignant neoplasm of other organs or systems: Secondary | ICD-10-CM | POA: Diagnosis not present

## 2017-01-04 DIAGNOSIS — Z803 Family history of malignant neoplasm of breast: Secondary | ICD-10-CM | POA: Diagnosis not present

## 2017-01-04 DIAGNOSIS — K5790 Diverticulosis of intestine, part unspecified, without perforation or abscess without bleeding: Secondary | ICD-10-CM | POA: Diagnosis present

## 2017-01-04 DIAGNOSIS — K3184 Gastroparesis: Secondary | ICD-10-CM | POA: Diagnosis not present

## 2017-01-04 DIAGNOSIS — K219 Gastro-esophageal reflux disease without esophagitis: Secondary | ICD-10-CM | POA: Diagnosis not present

## 2017-01-04 DIAGNOSIS — R918 Other nonspecific abnormal finding of lung field: Secondary | ICD-10-CM | POA: Diagnosis not present

## 2017-01-04 DIAGNOSIS — Z9049 Acquired absence of other specified parts of digestive tract: Secondary | ICD-10-CM

## 2017-01-04 DIAGNOSIS — D509 Iron deficiency anemia, unspecified: Secondary | ICD-10-CM | POA: Diagnosis present

## 2017-01-04 DIAGNOSIS — L02211 Cutaneous abscess of abdominal wall: Principal | ICD-10-CM | POA: Diagnosis present

## 2017-01-04 DIAGNOSIS — L03311 Cellulitis of abdominal wall: Secondary | ICD-10-CM | POA: Diagnosis present

## 2017-01-04 DIAGNOSIS — Z90710 Acquired absence of both cervix and uterus: Secondary | ICD-10-CM

## 2017-01-04 DIAGNOSIS — Z7982 Long term (current) use of aspirin: Secondary | ICD-10-CM

## 2017-01-04 DIAGNOSIS — K651 Peritoneal abscess: Secondary | ICD-10-CM | POA: Diagnosis not present

## 2017-01-04 DIAGNOSIS — Z90721 Acquired absence of ovaries, unilateral: Secondary | ICD-10-CM | POA: Diagnosis not present

## 2017-01-04 DIAGNOSIS — I10 Essential (primary) hypertension: Secondary | ICD-10-CM | POA: Diagnosis not present

## 2017-01-04 DIAGNOSIS — Z800 Family history of malignant neoplasm of digestive organs: Secondary | ICD-10-CM

## 2017-01-04 DIAGNOSIS — F17210 Nicotine dependence, cigarettes, uncomplicated: Secondary | ICD-10-CM | POA: Diagnosis present

## 2017-01-04 DIAGNOSIS — L0390 Cellulitis, unspecified: Secondary | ICD-10-CM

## 2017-01-04 DIAGNOSIS — E896 Postprocedural adrenocortical (-medullary) hypofunction: Secondary | ICD-10-CM | POA: Diagnosis not present

## 2017-01-04 DIAGNOSIS — Z818 Family history of other mental and behavioral disorders: Secondary | ICD-10-CM

## 2017-01-04 DIAGNOSIS — E039 Hypothyroidism, unspecified: Secondary | ICD-10-CM | POA: Diagnosis present

## 2017-01-04 DIAGNOSIS — J9811 Atelectasis: Secondary | ICD-10-CM | POA: Diagnosis not present

## 2017-01-04 LAB — URINALYSIS, ROUTINE W REFLEX MICROSCOPIC
BILIRUBIN URINE: NEGATIVE
GLUCOSE, UA: NEGATIVE mg/dL
HGB URINE DIPSTICK: NEGATIVE
KETONES UR: NEGATIVE mg/dL
LEUKOCYTES UA: NEGATIVE
Nitrite: NEGATIVE
PH: 6 (ref 5.0–8.0)
PROTEIN: NEGATIVE mg/dL
Specific Gravity, Urine: 1.042 — ABNORMAL HIGH (ref 1.005–1.030)

## 2017-01-04 LAB — COMPREHENSIVE METABOLIC PANEL
ALT: 21 U/L (ref 14–54)
ANION GAP: 8 (ref 5–15)
AST: 26 U/L (ref 15–41)
Albumin: 3.6 g/dL (ref 3.5–5.0)
Alkaline Phosphatase: 113 U/L (ref 38–126)
BUN: 7 mg/dL (ref 6–20)
CHLORIDE: 108 mmol/L (ref 101–111)
CO2: 28 mmol/L (ref 22–32)
Calcium: 9.8 mg/dL (ref 8.9–10.3)
Creatinine, Ser: 0.41 mg/dL — ABNORMAL LOW (ref 0.44–1.00)
GFR calc non Af Amer: >60 mL/min (ref >60)
Glucose, Bld: 89 mg/dL (ref 65–99)
POTASSIUM: 4.2 mmol/L (ref 3.5–5.1)
SODIUM: 144 mmol/L (ref 135–145)
Total Bilirubin: 0.4 mg/dL (ref 0.3–1.2)
Total Protein: 8 g/dL (ref 6.5–8.1)

## 2017-01-04 LAB — CBC WITH DIFFERENTIAL/PLATELET
Basophils Absolute: 0 10*3/uL (ref 0.0–0.1)
Basophils Relative: 0 %
Eosinophils Absolute: 0.4 10*3/uL (ref 0.0–0.7)
Eosinophils Relative: 4 %
HEMATOCRIT: 31.1 % — AB (ref 36.0–46.0)
HEMOGLOBIN: 10.6 g/dL — AB (ref 12.0–15.0)
LYMPHS ABS: 3 10*3/uL (ref 0.7–4.0)
Lymphocytes Relative: 30 %
MCH: 25.8 pg — AB (ref 26.0–34.0)
MCHC: 34.1 g/dL (ref 30.0–36.0)
MCV: 75.7 fL — AB (ref 78.0–100.0)
MONOS PCT: 5 %
Monocytes Absolute: 0.5 10*3/uL (ref 0.1–1.0)
NEUTROS ABS: 6 10*3/uL (ref 1.7–7.7)
NEUTROS PCT: 61 %
PLATELETS: 427 10*3/uL — AB (ref 150–400)
RBC: 4.11 MIL/uL (ref 3.87–5.11)
RDW: 17.1 % — AB (ref 11.5–15.5)
WBC: 10 10*3/uL (ref 4.0–10.5)

## 2017-01-04 LAB — MRSA PCR SCREENING: MRSA by PCR: NEGATIVE

## 2017-01-04 LAB — LACTIC ACID, PLASMA: Lactic Acid, Venous: 0.9 mmol/L (ref 0.5–1.9)

## 2017-01-04 MED ORDER — IOPAMIDOL (ISOVUE-300) INJECTION 61%
100.0000 mL | Freq: Once | INTRAVENOUS | Status: AC | PRN
  Administered 2017-01-04: 100 mL via INTRAVENOUS

## 2017-01-04 MED ORDER — SODIUM CHLORIDE 0.9 % IV BOLUS (SEPSIS)
1000.0000 mL | Freq: Once | INTRAVENOUS | Status: AC
  Administered 2017-01-04: 1000 mL via INTRAVENOUS

## 2017-01-04 MED ORDER — DOXYCYCLINE HYCLATE 100 MG IV SOLR
INTRAVENOUS | Status: AC
  Filled 2017-01-04: qty 100

## 2017-01-04 MED ORDER — PANTOPRAZOLE SODIUM 20 MG PO TBEC
20.0000 mg | DELAYED_RELEASE_TABLET | Freq: Every day | ORAL | Status: DC
  Filled 2017-01-04 (×2): qty 1

## 2017-01-04 MED ORDER — ACETAMINOPHEN 650 MG RE SUPP: 650.0000 mg | Freq: Four times a day (QID) | RECTAL | Status: DC | PRN

## 2017-01-04 MED ORDER — VANCOMYCIN HCL IN DEXTROSE 750-5 MG/150ML-% IV SOLN
750.0000 mg | Freq: Two times a day (BID) | INTRAVENOUS | Status: DC
  Administered 2017-01-05 – 2017-01-07 (×5): 750 mg via INTRAVENOUS
  Filled 2017-01-04 (×8): qty 150

## 2017-01-04 MED ORDER — ONDANSETRON HCL 4 MG/2ML IJ SOLN
4.0000 mg | Freq: Once | INTRAMUSCULAR | Status: AC
  Administered 2017-01-04: 4 mg via INTRAVENOUS
  Filled 2017-01-04: qty 2

## 2017-01-04 MED ORDER — ACETAMINOPHEN 325 MG PO TABS
650.0000 mg | ORAL_TABLET | Freq: Four times a day (QID) | ORAL | Status: DC | PRN
  Administered 2017-01-05: 650 mg via ORAL
  Filled 2017-01-04 (×2): qty 2

## 2017-01-04 MED ORDER — AMLODIPINE BESYLATE 5 MG PO TABS
5.0000 mg | ORAL_TABLET | Freq: Every day | ORAL | Status: DC
  Administered 2017-01-05 – 2017-01-07 (×3): 5 mg via ORAL
  Filled 2017-01-04 (×3): qty 1

## 2017-01-04 MED ORDER — HYDROMORPHONE HCL 1 MG/ML IJ SOLN
1.0000 mg | Freq: Once | INTRAMUSCULAR | Status: AC
  Administered 2017-01-04: 1 mg via INTRAVENOUS
  Filled 2017-01-04: qty 1

## 2017-01-04 MED ORDER — VANCOMYCIN HCL IN DEXTROSE 1-5 GM/200ML-% IV SOLN
1000.0000 mg | Freq: Once | INTRAVENOUS | Status: AC
  Administered 2017-01-04: 1000 mg via INTRAVENOUS
  Filled 2017-01-04: qty 200

## 2017-01-04 MED ORDER — DOXYCYCLINE HYCLATE 100 MG IV SOLR
100.0000 mg | Freq: Once | INTRAVENOUS | Status: AC
  Administered 2017-01-04: 100 mg via INTRAVENOUS
  Filled 2017-01-04: qty 100

## 2017-01-04 MED ORDER — ONDANSETRON HCL 4 MG PO TABS: 4.0000 mg | ORAL_TABLET | Freq: Four times a day (QID) | ORAL | Status: DC | PRN

## 2017-01-04 MED ORDER — LISINOPRIL 10 MG PO TABS
20.0000 mg | ORAL_TABLET | Freq: Every day | ORAL | Status: DC
  Administered 2017-01-05 – 2017-01-07 (×2): 20 mg via ORAL
  Filled 2017-01-04 (×4): qty 2

## 2017-01-04 MED ORDER — ASPIRIN EC 81 MG PO TBEC
162.0000 mg | DELAYED_RELEASE_TABLET | Freq: Every day | ORAL | Status: DC
  Administered 2017-01-05 – 2017-01-07 (×3): 162 mg via ORAL
  Filled 2017-01-04 (×5): qty 2

## 2017-01-04 MED ORDER — ZOLPIDEM TARTRATE 5 MG PO TABS
5.0000 mg | ORAL_TABLET | Freq: Every evening | ORAL | Status: DC | PRN
  Administered 2017-01-05: 5 mg via ORAL
  Filled 2017-01-04: qty 1

## 2017-01-04 MED ORDER — DIPHENHYDRAMINE HCL 25 MG PO CAPS
25.0000 mg | ORAL_CAPSULE | Freq: Once | ORAL | Status: AC
  Administered 2017-01-04: 25 mg via ORAL
  Filled 2017-01-04: qty 1

## 2017-01-04 MED ORDER — HYDROCODONE-ACETAMINOPHEN 5-325 MG PO TABS
1.0000 | ORAL_TABLET | ORAL | Status: DC | PRN
  Administered 2017-01-04 – 2017-01-05 (×3): 1 via ORAL
  Filled 2017-01-04 (×3): qty 1

## 2017-01-04 MED ORDER — ONDANSETRON HCL 4 MG/2ML IJ SOLN
4.0000 mg | Freq: Four times a day (QID) | INTRAMUSCULAR | Status: DC | PRN
  Administered 2017-01-06: 4 mg via INTRAVENOUS
  Filled 2017-01-04: qty 2

## 2017-01-04 NOTE — ED Notes (Signed)
Got family member snacks

## 2017-01-04 NOTE — H&P (Signed)
History and Physical  Angel Price PJK:[id number] DOB: [birth year] DOA: 01/04/2017  Referring physician: Dr Dayna Barker, ED physician PCP: Rosita Fire, MD   Patient Coming From: Home  Chief Complaint: Worsening abdominal wall drainage/abscess  HPI: Angel Price is a 51 y.o. female with a history of essential hypertension, anxiety, history of gastrectomy with Roux-en-Y anastomosis, history of Billroth II surgeries, history of abdominal wall abscesses, GERD, gastroparesis.  Patient seen 2 days ago with abdominal pain and drainage of abdominal wall wound that started approximately a week ago.  She was admitted at [facility] and based on vancomycin and Zosyn.  Patient was discharged on Doxy yesterday.  Since discharge, the patient continued to take her antibiotics, however her wound worsened and she has had increased purulence and increased erythema.  She denies fevers, chills.  She has baseline chronic nausea and intermittent diarrhea, which are not worsened.  Denies chest pain or shortness of breath.  Emergency Department Course: Received dose of IV vancomycin.  CT scan shows worsening abdominal wall cellulitis involving the subcutaneous fat in the right paramedian ventral abdomen which is increased in size from 10/18 with concerns of early developing abscess..  Review of Systems:   Pt denies any fevers, chills, nausea, vomiting, diarrhea, constipation, abdominal pain, shortness of breath, dyspnea on exertion, orthopnea, cough, wheezing, palpitations, headache, vision changes, lightheadedness, dizziness, melena, rectal bleeding.  Review of systems are otherwise negative  Past Medical History:  Diagnosis Date  . Abscess    soft tissue  . Adrenal mass ([place])   . Alcohol abuse   . Anxiety   . Blood transfusion without reported diagnosis   . Chronic abdominal pain   . Chronic wound infection of abdomen   . Colon polyp    colonoscopy 04/2014  . Depression   . Diverticulosis    colonoscopy 04/2014 moderat pan colonic  . Gastritis    EGD 05/2014  . Gastroparesis Nov 2015  . GERD (gastroesophageal reflux disease)   . Hemorrhoid    internal large  . Hiatal hernia   . History of Billroth II operation   . Hypertension   . Lung nodule    CT 02/2014 needs repeat 1 month  . Lung nodule < 6cm on CT 04/25/2014  . Lupus   . Nausea and vomiting    chronic, recurrent  . Pancreatitis   . Schatzki's ring    patent per EGD 04/2014  . Sickle cell trait ([place])   . Suicide attempt ([place])   . Thyroid disease 2000   overactive, radiation   Past Surgical History:  Procedure Laterality Date  . ABDOMINAL HYSTERECTOMY  2013   [place]  . ABDOMINAL SURGERY    . ADRENALECTOMY Right   . AGILE CAPSULE N/A 01/05/2015   Procedure: AGILE CAPSULE;  Surgeon: Daneil Dolin, MD;  Location: [facility];  Service: Endoscopy;  Laterality: N/A;  0700  . Billroth II procedure      [place], first 2000, 2005/2006.  Marland Kitchen BIOPSY  05/20/2013   Procedure: BIOPSIES OF ASCENDING AND SIGMOID COLON;  Surgeon: Daneil Dolin, MD;  Location: [facility] ORS;  Service: Endoscopy;;  . BIOPSY  04/26/2014   Procedure: BIOPSIES;  Surgeon: Danie Binder, MD;  Location: [facility] ORS;  Service: Endoscopy;;  . CHOLECYSTECTOMY    . COLONOSCOPY     in [place]  . COLONOSCOPY WITH PROPOFOL N/A 05/20/2013   Dr.Rourk- inadequate prep, normal appearing rectum, grossly normal colon aside from pancolonic diverticula, normal terminal ileum bx=  unremarkable colonic mucosa. Due for early interval 2016.   Marland Kitchen COLONOSCOPY WITH PROPOFOL N/A 04/26/2014   WUJ:WJXBJY ileum/one colon polyp removed/moderate pan-colonic diverticulosis/large internal hemorrhoids  . COLONOSCOPY WITH PROPOFOL N/A 12/23/2014   Dr.Rourk- minimal internal hemorrhoids, pancolonic diverticulosis  . COLONOSCOPY WITH PROPOFOL N/A 08/23/2016   Procedure: COLONOSCOPY WITH PROPOFOL;  Surgeon: Danie Binder, MD;  [facility];  Service: Endoscopy;  Laterality: N/A;  .  DEBRIDEMENT OF ABDOMINAL WALL ABSCESS N/A 02/08/2013   Procedure: DEBRIDEMENT OF ABDOMINAL WALL ABSCESS;  Surgeon: Jamesetta So, MD;  Location: [facility];  Service: General;  Laterality: N/A;  . ESOPHAGOGASTRODUODENOSCOPY (EGD) WITH PROPOFOL N/A 05/20/2013   Dr.Rourk- s/p prior gastric surgery with normal esophagus, residual gastric mucosa and patent efferent limb  . ESOPHAGOGASTRODUODENOSCOPY (EGD) WITH PROPOFOL N/A 02/03/2014   Dr. Gala Romney:  s/p hemigastrectomy with retained gastric contents. Residual gastric mucosa and efferent limb appeared normal otherwise. Query gastroparesis.   Marland Kitchen ESOPHAGOGASTRODUODENOSCOPY (EGD) WITH PROPOFOL N/A 04/26/2014   NWG:[id number]'M ring/small HH/mild non-erosive gasrtitis/normal anastomosis  . ESOPHAGOGASTRODUODENOSCOPY (EGD) WITH PROPOFOL N/A 12/23/2014   Dr.Rourk- s/p prior hemigastrctomy, active oozing from anastomotic suture site, hemostasis achieved  . ESOPHAGOGASTRODUODENOSCOPY (EGD) WITH PROPOFOL N/A 05/23/2016   Dr. 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 tendon repar Right    wrist  . WOUND EXPLORATION Right 06/24/2013   Procedure: exploration of traumatic wound right wrist;  Surgeon: Tennis Must, MD;  Location: [place];  Service: Orthopedics;  Laterality: Right;   Social History:  reports that she has been smoking Cigarettes.  She has a 17.50 pack-year smoking history. She has never used smokeless tobacco. She reports that she does not drink alcohol or use drugs. Patient lives at home  Allergies  Allergen Reactions  . Codeine Hives  . Morphine And Related Hives  . Reglan [Metoclopramide] Other (See Comments)    EPS symptoms     Family History  Problem Relation Age of Onset  . Brain cancer Son   . Schizophrenia Son   . Cancer Son        brain  . Lung cancer Father   . Cancer Father        mets  . Drug abuse Mother   . Breast cancer Maternal Aunt   .  Bipolar disorder Maternal Aunt   . Drug abuse Maternal Aunt   . Colon cancer Maternal Grandmother        late 8s, early 15s  . Drug abuse Sister   . Drug abuse Brother   . Bipolar disorder Paternal Grandfather   . Bipolar disorder Cousin   . Liver disease Neg Hx       Prior to Admission medications   Medication Sig Start Date End Date Taking? Authorizing Provider  amLODipine (NORVASC) 10 MG tablet Take 0.5 tablets (5 mg total) by mouth daily. 01/03/17  Yes Patrecia Pour, Christean Grief, MD  aspirin EC 81 MG EC tablet Take 2 tablets (162 mg total) by mouth daily. 11/27/16  Yes Reyne Dumas, MD  Cyanocobalamin (VITAMIN B-12) 2500 MCG TABS Take 5,000 mcg by mouth daily. 11/26/16  Yes Reyne Dumas, MD  doxycycline (VIBRAMYCIN) 50 MG capsule Take 1 capsule (50 mg total) by mouth 2 (two) times daily. 01/03/17 01/10/17 Yes Doreatha Lew, MD  HYDROcodone-acetaminophen (NORCO) 5-325 MG tablet Take 1 tablet by mouth every 4 (four) hours as needed for moderate pain. 01/03/17 01/06/17 Yes  Patrecia Pour, Christean Grief, MD  lisinopril (PRINIVIL,ZESTRIL) 20 MG tablet Take 1 tablet (20 mg total) by mouth daily. 11/26/16 11/26/17 Yes Reyne Dumas, MD  pantoprazole (PROTONIX) 20 MG tablet Take 1 tablet (20 mg total) by mouth daily. 09/23/16  Yes Francine Graven, DO  zolpidem (AMBIEN) 5 MG tablet Take 5 mg by mouth at bedtime as needed for sleep.   Yes [provider]    Physical Exam: BP (!) 150/98   Pulse 85   Temp 97.7 F (36.5 C) (Oral)   Resp 18   Ht 5\' 2"  (1.575 m)   Wt 62.6 kg (138 lb)   SpO2 91%   BMI 25.24 kg/m   General: Middle-aged black female. Awake and alert and oriented x3. No acute cardiopulmonary distress.  HEENT: Normocephalic atraumatic.  Right and left ears normal in appearance.  Pupils equal, round, reactive to light. Extraocular muscles are intact. Sclerae anicteric and noninjected.  Moist mucosal membranes. No mucosal lesions.  Neck: Neck supple without lymphadenopathy. No  carotid bruits. No masses palpated.  Cardiovascular: Regular rate with normal S1-S2 sounds. No murmurs, rubs, gallops auscultated. No JVD.  Respiratory: Good respiratory effort with no wheezes, rales, rhonchi. Lungs clear to auscultation bilaterally.  No accessory muscle use. Abdomen: Soft, nontender, nondistended. Active bowel sounds. No masses or hepatosplenomegaly  Skin: There is a 5-6 cm area of erythema to the right of her umbilicus that is indurated and tender to palpation.  This area was demarcated with a surgical pen.  Additionally, the patient has 2 smaller areas of spontaneously draining abscesses, which appeared to be the location of prior surgical sites.  These areas are approximately a centimeter in diameter and are located in the midline approximately 3-4 cm below the umbilicus and in the lower left quadrant.  Dry, warm to touch. 2+ dorsalis pedis and radial pulses. Musculoskeletal: No calf or leg pain. All major joints not erythematous nontender.  No upper or lower joint deformation.  Good ROM.  No contractures  Psychiatric: Intact judgment and insight. Pleasant and cooperative. Neurologic: No focal neurological deficits. Strength is 5/5 and symmetric in upper and lower extremities.  Cranial nerves II through XII are grossly intact.           Labs on Admission: I have personally reviewed following labs and imaging studies  CBC:  Recent Labs Lab 01/02/17 1350 01/03/17 0354 01/04/17 1401  WBC 11.5* 8.1 10.0  NEUTROABS 6.2  --  6.0  HGB 10.9* 9.1* 10.6*  HCT 31.3* 26.9* 31.1*  MCV 75.1* 74.7* 75.7*  PLT 457* 412* 419*   Basic Metabolic Panel:  Recent Labs Lab 01/02/17 1350 01/03/17 0354 01/04/17 1401  NA 135 139 144  K 2.7* 3.7 4.2  CL 105 110 108  CO2 17* 22 28  GLUCOSE 228* 81 89  BUN 7 7 7   CREATININE 0.48 0.48 0.41*  CALCIUM 8.8* 8.7* 9.8   GFR: Estimated Creatinine Clearance: 74 mL/min (A) (by C-G formula based on SCr of 0.41 mg/dL (L)). Liver Function  Tests:  Recent Labs Lab 01/02/17 1350 01/04/17 1401  AST 23 26  ALT 20 21  ALKPHOS 121 113  BILITOT 0.3 0.4  PROT 7.4 8.0  ALBUMIN 3.3* 3.6    Recent Labs Lab 01/02/17 1350  LIPASE 21   No results for input(s): AMMONIA in the last 168 hours. Coagulation Profile: No results for input(s): INR, PROTIME in the last 168 hours. Cardiac Enzymes: No results for input(s): CKTOTAL, CKMB, CKMBINDEX, TROPONINI in the last  168 hours. BNP (last 3 results) No results for input(s): PROBNP in the last 8760 hours. HbA1C: No results for input(s): HGBA1C in the last 72 hours. CBG: No results for input(s): GLUCAP in the last 168 hours. Lipid Profile: No results for input(s): CHOL, HDL, LDLCALC, TRIG, CHOLHDL, LDLDIRECT in the last 72 hours. Thyroid Function Tests: No results for input(s): TSH, T4TOTAL, FREET4, T3FREE, THYROIDAB in the last 72 hours. Anemia Panel: No results for input(s): VITAMINB12, FOLATE, FERRITIN, TIBC, IRON, RETICCTPCT in the last 72 hours. Urine analysis:    Component Value Date/Time   COLORURINE STRAW (A) 01/04/2017 1859   APPEARANCEUR CLEAR 01/04/2017 1859   LABSPEC 1.042 (H) 01/04/2017 1859   PHURINE 6.0 01/04/2017 1859   GLUCOSEU NEGATIVE 01/04/2017 1859   HGBUR NEGATIVE 01/04/2017 1859   BILIRUBINUR NEGATIVE 01/04/2017 1859   KETONESUR NEGATIVE 01/04/2017 1859   PROTEINUR NEGATIVE 01/04/2017 1859   UROBILINOGEN 0.2 11/26/2014 0915   NITRITE NEGATIVE 01/04/2017 1859   LEUKOCYTESUR NEGATIVE 01/04/2017 1859   Sepsis Labs: @LABRCNTIP (procalcitonin:4,lacticidven:4) ) Recent Results (from the past 240 hour(s))  Blood Culture (routine x 2)     Status: None (Preliminary result)   Collection Time: 01/02/17  5:38 PM  Result Value Ref Range Status   Specimen Description BLOOD BLOOD LEFT FOREARM  Final   Special Requests   Final    BOTTLES DRAWN AEROBIC AND ANAEROBIC Blood Culture adequate volume   Culture NO GROWTH 2 DAYS  Final   Report Status PENDING   Incomplete  Blood Culture (routine x 2)     Status: None (Preliminary result)   Collection Time: 01/02/17  5:50 PM  Result Value Ref Range Status   Specimen Description BLOOD RIGHT WRIST  Final   Special Requests   Final    BOTTLES DRAWN AEROBIC AND ANAEROBIC Blood Culture adequate volume   Culture NO GROWTH 2 DAYS  Final   Report Status PENDING  Incomplete     Radiological Exams on Admission: Dg Chest 2 View  Result Date: 01/04/2017 CLINICAL DATA:  Anterior abdominal draining wound. EXAM: CHEST  2 VIEW COMPARISON:  01/02/2017 and prior radiograph FINDINGS: Cardiomediastinal silhouette is unchanged. Mild pulmonary vascular congestion noted. Subsegmental atelectasis within the mid right lung noted. Mild bibasilar opacities may represent airspace disease/ pneumonia versus atelectasis. No pleural effusion or pneumothorax. IMPRESSION: Mild bibasilar opacities -question airspace disease/pneumonia versus atelectasis. Mid right lung subsegmental atelectasis. Electronically Signed   By: Margarette Canada M.D.   On: 01/04/2017 14:05   Ct Abdomen Pelvis W Contrast  Result Date: 01/04/2017 CLINICAL DATA:  Initial evaluation for acute abdominal infection. EXAM: CT ABDOMEN AND PELVIS WITH CONTRAST TECHNIQUE: Multidetector CT imaging of the abdomen and pelvis was performed using the standard protocol following bolus administration of intravenous contrast. CONTRAST:  160mL ISOVUE-300 IOPAMIDOL (ISOVUE-300) INJECTION 61% COMPARISON:  Prior CT from 01/02/2017. FINDINGS: Lower chest: 6 mm nodule partially visualize within the right middle lobe (series 8, image 1). Scattered atelectatic changes present within the visualized lung bases. Visualized lungs are otherwise clear. Hepatobiliary: Liver demonstrates a normal contrast enhanced appearance. Gallbladder surgically absent. Intra and extrahepatic biliary dilatation like related to post cholecystectomy changes. Pancreas: Pancreas somewhat atrophic in appearance but  within normal limits. Spleen: Spleen within normal limits. Adrenals/Urinary Tract: 2 cm left adrenal nodule again noted, stable from previous. Right adrenal gland is surgically absent. Kidneys equal in size with symmetric enhancement. No nephrolithiasis, hydronephrosis, or focal enhancing renal mass. No hydroureter. Bladder largely decompressed without acute abnormality. Stomach/Bowel: Postsurgical changes  noted about the GE junction and stomach. Stomach otherwise unremarkable. No evidence for bowel obstruction. Several loops of bowel appear intact CT and ear abdominal wall, like related underlying adhesive disease. No acute inflammatory changes seen about the bowels. Vascular/Lymphatic: Normal intravascular enhancement seen throughout the intra-abdominal aorta and and its branch vessels. Scattered aorto bi-iliac atherosclerotic disease. No aneurysm. No enlarged intra-abdominopelvic lymph nodes. Reproductive: Uterus is absent.  Ovaries not discretely identified. Other: No free air or fluid. Musculoskeletal: Healing left superior and inferior pubic rami fractures again noted. No other acute osseous abnormality. No other worrisome lytic or blastic osseous lesions. Abnormal swelling with inflammatory stranding involving the subcutaneous fat of the right paramedian ventral abdominal wall again seen, worsened in appearance and increased in size relative to recent CT. Area of involvement now measures approximately 3.8 x 2.1 cm (series 7, image 50), previously 3.2 x 2.3 cm). Central hypodensity within this area suspicious for possible early abscess (series 7, image 50). Process largely involves the subcutaneous fat, although there is extension into the underlying rectus musculature. No extension into the underlying peritoneal in at this time. Additional postsurgical scarring along the midline and within the left ventral abdominal wall again noted, stable. IMPRESSION: 1. Interval worsening and inflammatory process involving  the subcutaneous fat of the right paramedian ventral abdomen, increased in size as compared to recent CT from 01/02/2017. Developing central hypodensity within this area suspicious for early/developing abscess. No extension into the underlying peritoneum at this time. 2. No other new acute intra-abdominal or pelvic process. Electronically Signed   By: Jeannine Boga M.D.   On: 01/04/2017 18:01    Assessment/Plan: Principal Problem:   Abdominal wall abscess Active Problems:   History of gastric bypass   Essential hypertension, benign   Gastroparesis   History of Billroth II operation    This patient was discussed with the ED physician, including pertinent vitals, physical exam findings, labs, and imaging.  We also discussed care given by the ED provider.  #1 abdominal wall abscess  Admit  Continue vancomycin  Wound cultured  Consult general surgery  We will keep patient n.p.o. just in case the patient needs surgical intervention #2 history of gastric bypass, history of Billroth II operation  #3 Essential hypertension  Continue antihypertensives #4 Gastroparesis  No evidence of obstruction at this time.  DVT prophylaxis: SCDs Consultants: General surgery Code Status: Full Family Communication: None Disposition Plan: Return home following admission   Jacob Moores [facility] Pager [phone]  If 7PM-7AM, please contact night-coverage [email or web address] Password [email or web address]

## 2017-01-04 NOTE — ED Notes (Signed)
Reminded pt we needed a urine sample

## 2017-01-04 NOTE — ED Notes (Signed)
Pt c/o itching all over, no rash noted, pt requested benadryl, Dr. Nehemiah Settle notified and new order for one time dose of benadryl.

## 2017-01-04 NOTE — ED Triage Notes (Signed)
Pt reports being admitted at [facility] and d/c yesterday for abd wall cellulitis and abscess.  States redness has spread and pain is increasing.  Taking rx as directed at discharge.

## 2017-01-04 NOTE — ED Provider Notes (Signed)
Emergency Department Provider Note   I have reviewed the triage vital signs and the nursing notes.   HISTORY  Chief Complaint Abdominal Pain   HPI Angel Price is a 51 y.o. female the past medical history of multiple abdominal surgeries and a recent hospital admission for cellulitis of her abdomen.  Patient states she was discharged yesterday on doxycycline and got those medications filled however the redness, pain and drainage has increased since that time.  Has had some nausea but no fevers.  No change in bowel movements.  No vomiting.  No other associated modifying symptoms.   Past Medical History:  Diagnosis Date  . Abscess    soft tissue  . Adrenal mass ([place])   . Alcohol abuse   . Anxiety   . Blood transfusion without reported diagnosis   . Chronic abdominal pain   . Chronic wound infection of abdomen   . Colon polyp    colonoscopy 04/2014  . Depression   . Diverticulosis    colonoscopy 04/2014 moderat pan colonic  . Gastritis    EGD 05/2014  . Gastroparesis Nov 2015  . GERD (gastroesophageal reflux disease)   . Hemorrhoid    internal large  . Hiatal hernia   . History of Billroth II operation   . Hypertension   . Lung nodule    CT 02/2014 needs repeat 1 month  . Lung nodule < 6cm on CT 04/25/2014  . Lupus   . Nausea and vomiting    chronic, recurrent  . Pancreatitis   . Schatzki's ring    patent per EGD 04/2014  . Sickle cell trait ([place])   . Suicide attempt ([place])   . 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 Cellulitis of abdominal wall 01/02/2017  . Acute left hemiparesis ([place]) 12/05/2016  . Malingering 12/05/2016  . Weakness of left lower extremity 12/02/2016  . CVA (cerebral vascular accident) ([place]) 11/23/2016  . Bright red rectal bleeding 08/22/2016  . Hypotension due to blood loss   . Acute GI bleeding 05/24/2016  . Dieulafoy lesion of duodenum   . History of Billroth II  operation   . Gastrointestinal hemorrhage 05/22/2016  . Absolute anemia   . Pancreatitis, acute   . Acute pancreatitis 10/01/2015  . Abnormal CT scan of lung 10/01/2015  . Alcohol intoxication ([place])   . Left-sided weakness   . Psychosomatic factor in physical condition   . Upper GI bleed   . Diverticulosis of colon with hemorrhage   . Chronic wound infection of abdomen 12/22/2014  . Thyroid disease 12/22/2014  . Hypertension 12/22/2014  . Ataxia 11/01/2014  . Hemorrhoids 04/26/2014  . Lung nodule 04/26/2014  . Diverticulosis   . Gastritis   . Hiatal hernia   . Schatzki's ring   . Acute blood loss anemia 04/25/2014  . Sinus tachycardia 04/25/2014  . Hypokalemia 04/25/2014  . Hyponatremia 04/25/2014  . Hematemesis with nausea 04/25/2014  . Lung nodule < 6cm on CT 04/25/2014  . Intractable nausea and vomiting 04/24/2014  . Gastroparesis   . Abdominal pain 04/01/2014  . Chronic abdominal pain 01/21/2014  . Gastroenteritis 12/10/2013  . Chronic abdominal wound infection 08/16/2013  . MDD (major depressive disorder), recurrent episode, severe ([place]) 06/27/2013  . Wrist laceration 06/24/2013  . Nausea with vomiting 05/07/2013  . Diarrhea 05/07/2013  . Rectal bleeding 05/07/2013  . Abnormal LFTs 05/07/2013  . Adrenal mass, left ([place])  05/07/2013  . Abdominal wall abscess at site of surgical wound 04/19/2013  . Abdominal wall abscess 04/18/2013  . Frequent headaches 03/30/2013  . Sleep difficulties 03/30/2013  . Essential hypertension, benign 03/30/2013  . History of cocaine abuse 03/16/2013  . History of schizoaffective disorder 03/16/2013  . Bipolar disorder ([place]) 03/16/2013  . Personality disorder ([place]) 03/16/2013  . Tobacco abuse 03/16/2013  . Alcohol abuse 03/16/2013  . Palpitations 03/16/2013  . Poor vision 03/16/2013  . History of gastric bypass 03/16/2013  . Status post hysterectomy with oophorectomy 03/16/2013  . Hypothyroid 03/16/2013  . Lupus ([place]) 03/16/2013      Past Surgical History:  Procedure Laterality Date  . ABDOMINAL HYSTERECTOMY  2013   [place]  . ABDOMINAL SURGERY    . ADRENALECTOMY Right   . AGILE CAPSULE N/A 01/05/2015   Procedure: AGILE CAPSULE;  Surgeon: Daneil Dolin, MD;  Location: [facility];  Service: Endoscopy;  Laterality: N/A;  0700  . Billroth II procedure      [place], first 2000, 2005/2006.  Marland Kitchen BIOPSY  05/20/2013   Procedure: BIOPSIES OF ASCENDING AND SIGMOID COLON;  Surgeon: Daneil Dolin, MD;  Location: [facility];  Service: Endoscopy;;  . BIOPSY  04/26/2014   Procedure: BIOPSIES;  Surgeon: Danie Binder, MD;  Location: [facility];  Service: Endoscopy;;  . CHOLECYSTECTOMY    . COLONOSCOPY     in [place]  . COLONOSCOPY WITH PROPOFOL N/A 05/20/2013   Dr.Rourk- inadequate prep, normal appearing rectum, grossly normal colon aside from pancolonic diverticula, normal terminal ileum bx= unremarkable colonic mucosa. Due for early interval 2016.   Marland Kitchen COLONOSCOPY WITH PROPOFOL N/A 04/26/2014   [facility]:NFAOZH ileum/one colon polyp removed/moderate pan-colonic diverticulosis/large internal hemorrhoids  . COLONOSCOPY WITH PROPOFOL N/A 12/23/2014   Dr.Rourk- minimal internal hemorrhoids, pancolonic diverticulosis  . COLONOSCOPY WITH PROPOFOL N/A 08/23/2016   Procedure: COLONOSCOPY WITH PROPOFOL;  Surgeon: Danie Binder, MD;  [facility];  Service: Endoscopy;  Laterality: N/A;  . DEBRIDEMENT OF ABDOMINAL WALL ABSCESS N/A 02/08/2013   Procedure: DEBRIDEMENT OF ABDOMINAL WALL ABSCESS;  Surgeon: Jamesetta So, MD;  Location: [facility];  Service: General;  Laterality: N/A;  . ESOPHAGOGASTRODUODENOSCOPY (EGD) WITH PROPOFOL N/A 05/20/2013   Dr.Rourk- s/p prior gastric surgery with normal esophagus, residual gastric mucosa and patent efferent limb  . ESOPHAGOGASTRODUODENOSCOPY (EGD) WITH PROPOFOL N/A 02/03/2014   Dr. Gala Romney:  s/p hemigastrectomy with retained gastric contents. Residual gastric mucosa and efferent limb appeared normal  otherwise. Query gastroparesis.   Marland Kitchen ESOPHAGOGASTRODUODENOSCOPY (EGD) WITH PROPOFOL N/A 04/26/2014   YQM:VHQIONGE'X ring/small HH/mild non-erosive gasrtitis/normal anastomosis  . ESOPHAGOGASTRODUODENOSCOPY (EGD) WITH PROPOFOL N/A 12/23/2014   Dr.Rourk- s/p prior hemigastrctomy, active oozing from anastomotic suture site, hemostasis achieved  . ESOPHAGOGASTRODUODENOSCOPY (EGD) WITH PROPOFOL N/A 05/23/2016   Dr. 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 tendon repar Right    wrist  . WOUND EXPLORATION Right 06/24/2013   Procedure: exploration of traumatic wound right wrist;  Surgeon: Tennis Must, MD;  Location: [facility];  Service: Orthopedics;  Laterality: Right;    Current Outpatient Rx  . Order #: [id number] Class: Print  . Order #: [id number] Class: Normal  . Order #: [id number] Class: Normal  . Order #: [id number] Class: Print  . Order #: [id number] Class: Print  . Order #: [id number] Class: Normal  . Order #: [id number] Class: Print  . Order #: [id number] Class: Historical Med  . Order #: [id number] Class: Historical  Med    Allergies Codeine; Morphine and related; and Reglan [metoclopramide]  Family History  Problem Relation Age of Onset  . Brain cancer Son   . Schizophrenia Son   . Cancer Son        brain  . Lung cancer Father   . Cancer Father        mets  . Drug abuse Mother   . Breast cancer Maternal Aunt   . Bipolar disorder Maternal Aunt   . Drug abuse Maternal Aunt   . Colon cancer Maternal Grandmother        late 65s, early 33s  . Drug abuse Sister   . Drug abuse Brother   . Bipolar disorder Paternal Grandfather   . Bipolar disorder Cousin   . Liver disease Neg Hx     Social History Social History  Substance Use Topics  . Smoking status: Current Every Day Smoker    Packs/day: 0.50    Years: 35.00    Types: Cigarettes  . Smokeless tobacco: Never Used  . Alcohol use No     Comment:  has had a beer yesterday    Review of Systems  All other systems negative except as documented in the HPI. All pertinent positives and negatives as reviewed in the HPI. ____________________________________________   PHYSICAL EXAM:  VITAL SIGNS: ED Triage Vitals  Enc Vitals Group     BP 01/04/17 1239 (!) 129/92     Pulse Rate 01/04/17 1239 92     Resp 01/04/17 1239 18     Temp 01/04/17 1239 97.7 F (36.5 C)     Temp Source 01/04/17 1239 Oral     SpO2 01/04/17 1239 96 %     Weight 01/04/17 1240 138 lb (62.6 kg)     Height 01/04/17 1240 5\' 2"  (1.575 m)    Constitutional: Alert and oriented. Well appearing and in no acute distress. Eyes: Conjunctivae are normal. PERRL. EOMI. Head: Atraumatic. Nose: No congestion/rhinnorhea. Mouth/Throat: Mucous membranes are moist.  Oropharynx non-erythematous. Neck: No stridor.  No meningeal signs.   Cardiovascular: Normal rate, regular rhythm. Good peripheral circulation. Grossly normal heart sounds.   Respiratory: Normal respiratory effort.  No retractions. Lungs CTAB. Gastrointestinal: Soft and mildly tender over 4x8cm erythematous area to the right of her umbilicus and previous wounds. Mild drainage from different wound lower on abdomen. No distention.  Musculoskeletal: No lower extremity tenderness nor edema. No gross deformities of extremities. Neurologic:  Normal speech and language. No gross focal neurologic deficits are appreciated.  Skin:  Skin is warm, dry and intact. No rash noted.   ____________________________________________   LABS (all labs ordered are listed, but only abnormal results are displayed)  Labs Reviewed  COMPREHENSIVE METABOLIC PANEL - Abnormal; Notable for the following:       Result Value   Creatinine, Ser 0.41 (*)    All other components within normal limits  CBC WITH DIFFERENTIAL/PLATELET - Abnormal; Notable for the following:    Hemoglobin 10.6 (*)    HCT 31.1 (*)    MCV 75.7 (*)    MCH 25.8 (*)     RDW 17.1 (*)    Platelets 427 (*)    All other components within normal limits  LACTIC ACID, PLASMA  URINALYSIS, ROUTINE W REFLEX MICROSCOPIC   ____________________________________________  RADIOLOGY  Dg Chest 2 View  Result Date: 01/04/2017 CLINICAL DATA:  Anterior abdominal draining wound. EXAM: CHEST  2 VIEW COMPARISON:  01/02/2017 and prior radiograph FINDINGS: Cardiomediastinal silhouette is unchanged.  Mild pulmonary vascular congestion noted. Subsegmental atelectasis within the mid right lung noted. Mild bibasilar opacities may represent airspace disease/ pneumonia versus atelectasis. No pleural effusion or pneumothorax. IMPRESSION: Mild bibasilar opacities -question airspace disease/pneumonia versus atelectasis. Mid right lung subsegmental atelectasis. Electronically Signed   By: Margarette Canada M.D.   On: 01/04/2017 14:05    ____________________________________________   PROCEDURES  Procedure(s) performed:   Procedures   ____________________________________________   INITIAL IMPRESSION / ASSESSMENT AND PLAN / ED COURSE  Pertinent labs & imaging results that were available during my care of the patient were reviewed by me and considered in my medical decision making (see chart for details).  Worsening infection, possible new abscess but not in . Pain controlled, started vancomycin. Will admit for IV antibiotics and management.   ____________________________________________  FINAL CLINICAL IMPRESSION(S) / ED DIAGNOSES  Final diagnoses:  Cellulitis, unspecified cellulitis site  Abscess     MEDICATIONS GIVEN DURING THIS VISIT:  Medications - No data to display   NEW OUTPATIENT MEDICATIONS STARTED DURING THIS VISIT:  New Prescriptions   No medications on file    Note:  This document was prepared using Dragon voice recognition software and may include unintentional dictation errors.   Merrily Pew, MD 01/05/17 [phone]

## 2017-01-04 NOTE — Progress Notes (Signed)
Pharmacy Antibiotic Note  Angel Price is a 51 y.o. female admitted on 01/04/2017 with cellulitis.  Pharmacy has been consulted for Vancomycin dosing.  Plan: Vancomycin 1 GM IV given in ED Vancomycin 750 IV every 12 hours.  Goal trough 10-15 mcg/mL. Labs per protocol  Height: 5\' 2"  (157.5 cm) Weight: 138 lb (62.6 kg) IBW/kg (Calculated) : 50.1  Temp (24hrs), Avg:97.7 F (36.5 C), Min:97.7 F (36.5 C), Max:97.7 F (36.5 C)   Recent Labs Lab 01/02/17 1350 01/02/17 1401 01/02/17 1930 01/03/17 0354 01/04/17 1401  WBC 11.5*  --   --  8.1 10.0  CREATININE 0.48  --   --  0.48 0.41*  LATICACIDVEN  --  3.89* 0.92  --  0.9    Estimated Creatinine Clearance: 74 mL/min (A) (by C-G formula based on SCr of 0.41 mg/dL (L)).    Allergies  Allergen Reactions  . Codeine Hives  . Morphine And Related Hives  . Reglan [Metoclopramide] Other (See Comments)    EPS symptoms     Antimicrobials this admission: Vancomycin  10/20 >>    >>    Thank you for allowing pharmacy to be a part of this patient's care.  Chriss Czar 01/04/2017 8:59 PM

## 2017-01-04 NOTE — ED Notes (Signed)
Pt states that she has itching when her nerves get bad.

## 2017-01-05 DIAGNOSIS — Z9884 Bariatric surgery status: Secondary | ICD-10-CM

## 2017-01-05 DIAGNOSIS — L0390 Cellulitis, unspecified: Secondary | ICD-10-CM

## 2017-01-05 DIAGNOSIS — K3184 Gastroparesis: Secondary | ICD-10-CM

## 2017-01-05 DIAGNOSIS — L02211 Cutaneous abscess of abdominal wall: Principal | ICD-10-CM

## 2017-01-05 DIAGNOSIS — Z980 Intestinal bypass and anastomosis status: Secondary | ICD-10-CM

## 2017-01-05 DIAGNOSIS — L0291 Cutaneous abscess, unspecified: Secondary | ICD-10-CM

## 2017-01-05 DIAGNOSIS — I10 Essential (primary) hypertension: Secondary | ICD-10-CM

## 2017-01-05 LAB — CBC
HCT: 27.9 % — ABNORMAL LOW (ref 36.0–46.0)
HEMOGLOBIN: 9.7 g/dL — AB (ref 12.0–15.0)
MCH: 26 pg (ref 26.0–34.0)
MCHC: 34.8 g/dL (ref 30.0–36.0)
MCV: 74.8 fL — AB (ref 78.0–100.0)
PLATELETS: 423 10*3/uL — AB (ref 150–400)
RBC: 3.73 MIL/uL — AB (ref 3.87–5.11)
RDW: 17 % — ABNORMAL HIGH (ref 11.5–15.5)
WBC: 7.3 10*3/uL (ref 4.0–10.5)

## 2017-01-05 LAB — BASIC METABOLIC PANEL
ANION GAP: 7 (ref 5–15)
BUN: 7 mg/dL (ref 6–20)
CHLORIDE: 106 mmol/L (ref 101–111)
CO2: 26 mmol/L (ref 22–32)
CREATININE: 0.43 mg/dL — AB (ref 0.44–1.00)
Calcium: 8.9 mg/dL (ref 8.9–10.3)
GFR calc non Af Amer: >60 mL/min (ref >60)
Glucose, Bld: 107 mg/dL — ABNORMAL HIGH (ref 65–99)
POTASSIUM: 3.3 mmol/L — AB (ref 3.5–5.1)
SODIUM: 139 mmol/L (ref 135–145)

## 2017-01-05 LAB — SURGICAL PCR SCREEN
MRSA, PCR: NEGATIVE
Staphylococcus aureus: NEGATIVE

## 2017-01-05 MED ORDER — CHLORHEXIDINE GLUCONATE CLOTH 2 % EX PADS
6.0000 | MEDICATED_PAD | Freq: Once | CUTANEOUS | Status: AC
  Administered 2017-01-05: 6 via TOPICAL

## 2017-01-05 MED ORDER — PANTOPRAZOLE SODIUM 40 MG PO TBEC
40.0000 mg | DELAYED_RELEASE_TABLET | Freq: Every day | ORAL | Status: DC
  Administered 2017-01-05 – 2017-01-07 (×3): 40 mg via ORAL
  Filled 2017-01-05 (×3): qty 1

## 2017-01-05 MED ORDER — HYDROCODONE-ACETAMINOPHEN 5-325 MG PO TABS
1.0000 | ORAL_TABLET | Freq: Four times a day (QID) | ORAL | Status: DC | PRN
  Administered 2017-01-05 – 2017-01-07 (×4): 2 via ORAL
  Filled 2017-01-05 (×4): qty 2

## 2017-01-05 MED ORDER — POTASSIUM CHLORIDE CRYS ER 20 MEQ PO TBCR
20.0000 meq | EXTENDED_RELEASE_TABLET | Freq: Two times a day (BID) | ORAL | Status: DC
  Administered 2017-01-05 – 2017-01-07 (×4): 20 meq via ORAL
  Filled 2017-01-05 (×4): qty 1

## 2017-01-05 MED ORDER — CHLORHEXIDINE GLUCONATE CLOTH 2 % EX PADS
6.0000 | MEDICATED_PAD | Freq: Once | CUTANEOUS | Status: AC
  Administered 2017-01-06: 6 via TOPICAL

## 2017-01-05 NOTE — Progress Notes (Signed)
PROGRESS NOTE    Angel Price  PYP:[id number] DOB: 10/01/1965 DOA: 01/04/2017 PCP: Rosita Fire, MD    Brief Narrative:   Angel Price is a 51 y.o. female with a history of essential hypertension, anxiety, history of gastrectomy with Roux-en-Y anastomosis, history of Billroth II surgeries, history of abdominal wall abscesses, GERD, gastroparesis.  Patient seen 2 days ago with abdominal pain and drainage of abdominal wall wound that started approximately a week ago.  She was admitted at [facility] and based on vancomycin and Zosyn.  Patient was discharged on Doxy yesterday.  Since discharge, the patient continued to take her antibiotics, however her wound worsened and she has had increased purulence and increased erythema.  She denies fevers, chills.  She has baseline chronic nausea and intermittent diarrhea, which are not worsened.  Denies chest pain or shortness of breath.  Emergency Department Course: Received dose of IV vancomycin.  CT scan shows worsening abdominal wall cellulitis involving the subcutaneous fat in the right paramedian ventral abdomen which is increased in size from 10/18 with concerns of early developing abscess..    Assessment & Plan:   Principal Problem:   Abdominal wall abscess Active Problems:   History of gastric bypass   Essential hypertension, benign   Gastroparesis   History of Billroth II operation   Abdominal wall abscess  Continue vancomycin  Wound culture  General surgery consulted  Plan to go to OR tomorrow  History of gastric bypass, history of Billroth II operation  Microcytic Anemia - appears stable - iron studies  Essential hypertension - Continue antihypertensives  Gastroparesis - no complaints voiced  DVT prophylaxis: SCDs Code Status: Full Family Communication: None Disposition Plan: Return home following admission    Consultants:   General Surgery  Procedures:   None  Antimicrobials:    Vancomycin    Subjective: Patient laying in bed.  Says she is in significant pain.  Anxious to see Dr. Arnoldo Morale.  Objective: Vitals:   01/04/17 1947 01/04/17 2000 01/04/17 2059 01/05/17 0415  BP:  (!) 126/95 (!) 139/92 131/84  Pulse: 82  77 85  Resp:   20   Temp:   98.1 F (36.7 C) 98.2 F (36.8 C)  TempSrc:   Oral Oral  SpO2: 94%  97% 95%  Weight:   62.1 kg (136 lb 12.8 oz)   Height:   5\' 3"  (1.6 m)     Intake/Output Summary (Last 24 hours) at 01/05/17 1008 Last data filed at 01/05/17 0550  Gross per 24 hour  Intake             1600 ml  Output             1000 ml  Net              600 ml   Filed Weights   01/04/17 1240 01/04/17 2059  Weight: 62.6 kg (138 lb) 62.1 kg (136 lb 12.8 oz)    Examination:  General exam: Appears calm and comfortable  Respiratory system: Clear to auscultation. Respiratory effort normal. Cardiovascular system: S1 & S2 heard, RRR. No JVD, murmurs, rubs, gallops or clicks. No pedal edema. Gastrointestinal system: Numerous scars on abdomen. Abdomen is nondistended, soft and tender periumbilically. No organomegaly or masses felt. Normal bowel sounds heard. Central nervous system: Alert and oriented. No focal neurological deficits. Extremities: Symmetric 5 x 5 power. Skin: numerous scars on abdomen, open area below umbilicus that is draining Psychiatry: Judgement and insight appear normal. Mood &  affect appropriate.     Data Reviewed: I have personally reviewed following labs and imaging studies  CBC:  Recent Labs Lab 01/02/17 1350 01/03/17 0354 01/04/17 1401 01/05/17 0624  WBC 11.5* 8.1 10.0 7.3  NEUTROABS 6.2  --  6.0  --   HGB 10.9* 9.1* 10.6* 9.7*  HCT 31.3* 26.9* 31.1* 27.9*  MCV 75.1* 74.7* 75.7* 74.8*  PLT 457* 412* 427* 308*   Basic Metabolic Panel:  Recent Labs Lab 01/02/17 1350 01/03/17 0354 01/04/17 1401 01/05/17 0624  NA 135 139 144 139  K 2.7* 3.7 4.2 3.3*  CL 105 110 108 106  CO2 17* 22 28 26   GLUCOSE 228*  81 89 107*  BUN 7 7 7 7   CREATININE 0.48 0.48 0.41* 0.43*  CALCIUM 8.8* 8.7* 9.8 8.9   GFR: Estimated Creatinine Clearance: 70.4 mL/min (A) (by C-G formula based on SCr of 0.43 mg/dL (L)). Liver Function Tests:  Recent Labs Lab 01/02/17 1350 01/04/17 1401  AST 23 26  ALT 20 21  ALKPHOS 121 113  BILITOT 0.3 0.4  PROT 7.4 8.0  ALBUMIN 3.3* 3.6    Recent Labs Lab 01/02/17 1350  LIPASE 21   No results for input(s): AMMONIA in the last 168 hours. Coagulation Profile: No results for input(s): INR, PROTIME in the last 168 hours. Cardiac Enzymes: No results for input(s): CKTOTAL, CKMB, CKMBINDEX, TROPONINI in the last 168 hours. BNP (last 3 results) No results for input(s): PROBNP in the last 8760 hours. HbA1C: No results for input(s): HGBA1C in the last 72 hours. CBG: No results for input(s): GLUCAP in the last 168 hours. Lipid Profile: No results for input(s): CHOL, HDL, LDLCALC, TRIG, CHOLHDL, LDLDIRECT in the last 72 hours. Thyroid Function Tests: No results for input(s): TSH, T4TOTAL, FREET4, T3FREE, THYROIDAB in the last 72 hours. Anemia Panel: No results for input(s): VITAMINB12, FOLATE, FERRITIN, TIBC, IRON, RETICCTPCT in the last 72 hours. Sepsis Labs:  Recent Labs Lab 01/02/17 1401 01/02/17 1930 01/04/17 1401  LATICACIDVEN 3.89* 0.92 0.9    Recent Results (from the past 240 hour(s))  Blood Culture (routine x 2)     Status: None (Preliminary result)   Collection Time: 01/02/17  5:38 PM  Result Value Ref Range Status   Specimen Description BLOOD BLOOD LEFT FOREARM  Final   Special Requests   Final    BOTTLES DRAWN AEROBIC AND ANAEROBIC Blood Culture adequate volume   Culture NO GROWTH 2 DAYS  Final   Report Status PENDING  Incomplete  Blood Culture (routine x 2)     Status: None (Preliminary result)   Collection Time: 01/02/17  5:50 PM  Result Value Ref Range Status   Specimen Description BLOOD RIGHT WRIST  Final   Special Requests   Final    BOTTLES  DRAWN AEROBIC AND ANAEROBIC Blood Culture adequate volume   Culture NO GROWTH 2 DAYS  Final   Report Status PENDING  Incomplete  MRSA PCR Screening     Status: None   Collection Time: 01/04/17  7:18 PM  Result Value Ref Range Status   MRSA by PCR NEGATIVE NEGATIVE Final    Comment:        The GeneXpert MRSA Assay (FDA approved for NASAL specimens only), is one component of a comprehensive MRSA colonization surveillance program. It is not intended to diagnose MRSA infection nor to guide or monitor treatment for MRSA infections.          Radiology Studies: Dg Chest 2 View  Result Date:  01/04/2017 CLINICAL DATA:  Anterior abdominal draining wound. EXAM: CHEST  2 VIEW COMPARISON:  01/02/2017 and prior radiograph FINDINGS: Cardiomediastinal silhouette is unchanged. Mild pulmonary vascular congestion noted. Subsegmental atelectasis within the mid right lung noted. Mild bibasilar opacities may represent airspace disease/ pneumonia versus atelectasis. No pleural effusion or pneumothorax. IMPRESSION: Mild bibasilar opacities -question airspace disease/pneumonia versus atelectasis. Mid right lung subsegmental atelectasis. Electronically Signed   By: Margarette Canada M.D.   On: 01/04/2017 14:05   Ct Abdomen Pelvis W Contrast  Result Date: 01/04/2017 CLINICAL DATA:  Initial evaluation for acute abdominal infection. EXAM: CT ABDOMEN AND PELVIS WITH CONTRAST TECHNIQUE: Multidetector CT imaging of the abdomen and pelvis was performed using the standard protocol following bolus administration of intravenous contrast. CONTRAST:  180mL ISOVUE-300 IOPAMIDOL (ISOVUE-300) INJECTION 61% COMPARISON:  Prior CT from 01/02/2017. FINDINGS: Lower chest: 6 mm nodule partially visualize within the right middle lobe (series 8, image 1). Scattered atelectatic changes present within the visualized lung bases. Visualized lungs are otherwise clear. Hepatobiliary: Liver demonstrates a normal contrast enhanced appearance.  Gallbladder surgically absent. Intra and extrahepatic biliary dilatation like related to post cholecystectomy changes. Pancreas: Pancreas somewhat atrophic in appearance but within normal limits. Spleen: Spleen within normal limits. Adrenals/Urinary Tract: 2 cm left adrenal nodule again noted, stable from previous. Right adrenal gland is surgically absent. Kidneys equal in size with symmetric enhancement. No nephrolithiasis, hydronephrosis, or focal enhancing renal mass. No hydroureter. Bladder largely decompressed without acute abnormality. Stomach/Bowel: Postsurgical changes noted about the GE junction and stomach. Stomach otherwise unremarkable. No evidence for bowel obstruction. Several loops of bowel appear intact CT and ear abdominal wall, like related underlying adhesive disease. No acute inflammatory changes seen about the bowels. Vascular/Lymphatic: Normal intravascular enhancement seen throughout the intra-abdominal aorta and and its branch vessels. Scattered aorto bi-iliac atherosclerotic disease. No aneurysm. No enlarged intra-abdominopelvic lymph nodes. Reproductive: Uterus is absent.  Ovaries not discretely identified. Other: No free air or fluid. Musculoskeletal: Healing left superior and inferior pubic rami fractures again noted. No other acute osseous abnormality. No other worrisome lytic or blastic osseous lesions. Abnormal swelling with inflammatory stranding involving the subcutaneous fat of the right paramedian ventral abdominal wall again seen, worsened in appearance and increased in size relative to recent CT. Area of involvement now measures approximately 3.8 x 2.1 cm (series 7, image 50), previously 3.2 x 2.3 cm). Central hypodensity within this area suspicious for possible early abscess (series 7, image 50). Process largely involves the subcutaneous fat, although there is extension into the underlying rectus musculature. No extension into the underlying peritoneal in at this time.  Additional postsurgical scarring along the midline and within the left ventral abdominal wall again noted, stable. IMPRESSION: 1. Interval worsening and inflammatory process involving the subcutaneous fat of the right paramedian ventral abdomen, increased in size as compared to recent CT from 01/02/2017. Developing central hypodensity within this area suspicious for early/developing abscess. No extension into the underlying peritoneum at this time. 2. No other new acute intra-abdominal or pelvic process. Electronically Signed   By: Jeannine Boga M.D.   On: 01/04/2017 18:01        Scheduled Meds: . amLODipine  5 mg Oral Daily  . aspirin EC  162 mg Oral Daily  . lisinopril  20 mg Oral Daily  . pantoprazole  40 mg Oral Daily   Continuous Infusions: . vancomycin Stopped (01/05/17 0650)     LOS: 1 day    Time spent: 30 minutes    Cristie Hem  Magnus Sinning, MD [facility] Pager [phone]  If 7PM-7AM, please contact night-coverage [email or web address] Password [email or web address] 01/05/2017, 10:08 AM

## 2017-01-05 NOTE — Consult Note (Signed)
Reason for Consult: Abdominal wall cellulitis Referring Physician: Dr. [name] is an 51 y.o. female.  HPI: Patient is a 51 year old black female with multiple medical problems who I have seen in the past for abdominal wall abscesses. She has a complicated surgical history. The source of her recurrent abscesses has never really been found. She has been seen by multiple surgeons and plastic surgery, though patient has been noncompliant. She was recently seen at [facility] and discharged on doxycycline. She presents back with worsening pain and swelling. Her cellulitis is in a different area of the the abdominal wall than what I have taken care of in the past. She currently has a pain of 8 out of 10.  Past Medical History:  Diagnosis Date  . Abscess    soft tissue  . Adrenal mass ([place])   . Alcohol abuse   . Anxiety   . Blood transfusion without reported diagnosis   . Chronic abdominal pain   . Chronic wound infection of abdomen   . Colon polyp    colonoscopy 04/2014  . Depression   . Diverticulosis    colonoscopy 04/2014 moderat pan colonic  . Gastritis    EGD 05/2014  . Gastroparesis Nov 2015  . GERD (gastroesophageal reflux disease)   . Hemorrhoid    internal large  . Hiatal hernia   . History of Billroth II operation   . Hypertension   . Lung nodule    CT 02/2014 needs repeat 1 month  . Lung nodule < 6cm on CT 04/25/2014  . Lupus   . Nausea and vomiting    chronic, recurrent  . Pancreatitis   . Schatzki's ring    patent per EGD 04/2014  . Sickle cell trait ([place])   . Suicide attempt ([place])   . 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 ABDOMINAL HYSTERECTOMY  2013   [place]  . ABDOMINAL SURGERY    . ADRENALECTOMY Right   . AGILE CAPSULE N/A 01/05/2015   Procedure: AGILE CAPSULE;  Surgeon: Daneil Dolin, MD;  Location: [facility];  Service: Endoscopy;  Laterality: N/A;  0700  . Billroth II  procedure      [place], first 2000, 2005/2006.  Marland Kitchen BIOPSY  05/20/2013   Procedure: BIOPSIES OF ASCENDING AND SIGMOID COLON;  Surgeon: Daneil Dolin, MD;  Location: [facility];  Service: Endoscopy;;  . BIOPSY  04/26/2014   Procedure: BIOPSIES;  Surgeon: Danie Binder, MD;  Location: [facility];  Service: Endoscopy;;  . CHOLECYSTECTOMY    . COLONOSCOPY     in [place]  . COLONOSCOPY WITH PROPOFOL N/A 05/20/2013   Dr.Rourk- inadequate prep, normal appearing rectum, grossly normal colon aside from pancolonic diverticula, normal terminal ileum bx= unremarkable colonic mucosa. Due for early interval 2016.   Marland Kitchen COLONOSCOPY WITH PROPOFOL N/A 04/26/2014   BOF:BPZWCH ileum/one colon polyp removed/moderate pan-colonic diverticulosis/large internal hemorrhoids  . COLONOSCOPY WITH PROPOFOL N/A 12/23/2014   Dr.Rourk- minimal internal hemorrhoids, pancolonic diverticulosis  . COLONOSCOPY WITH PROPOFOL N/A 08/23/2016   Procedure: COLONOSCOPY WITH PROPOFOL;  Surgeon: Danie Binder, MD;  Location: [facility];  Service: Endoscopy;  Laterality: N/A;  . DEBRIDEMENT OF ABDOMINAL WALL ABSCESS N/A 02/08/2013   Procedure: DEBRIDEMENT OF ABDOMINAL WALL ABSCESS;  Surgeon: Jamesetta So, MD;  Location: [facility];  Service: General;  Laterality: N/A;  . ESOPHAGOGASTRODUODENOSCOPY (EGD) WITH PROPOFOL N/A 05/20/2013   Dr.Rourk- s/p  prior gastric surgery with normal esophagus, residual gastric mucosa and patent efferent limb  . ESOPHAGOGASTRODUODENOSCOPY (EGD) WITH PROPOFOL N/A 02/03/2014   Dr. Rourk:  s/p hemigastrectomy with retained gastric contents. Residual gastric mucosa and efferent limb appeared normal otherwise. Query gastroparesis.   . ESOPHAGOGASTRODUODENOSCOPY (EGD) WITH PROPOFOL N/A 04/26/2014   SLF:schatzki's ring/small HH/mild non-erosive gasrtitis/normal anastomosis  . ESOPHAGOGASTRODUODENOSCOPY (EGD) WITH PROPOFOL N/A 12/23/2014   Dr.Rourk- s/p prior hemigastrctomy, active oozing from anastomotic suture site, hemostasis  achieved  . ESOPHAGOGASTRODUODENOSCOPY (EGD) WITH PROPOFOL N/A 05/23/2016   Dr. 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 tendon repar Right    wrist  . WOUND EXPLORATION Right 06/24/2013   Procedure: exploration of traumatic wound right wrist;  Surgeon: [place] R Kuzma, MD;  Location: [facility];  Service: Orthopedics;  Laterality: Right;    Family History  Problem Relation Age of Onset  . Brain cancer Son   . Schizophrenia Son   . Cancer Son        brain  . Lung cancer Father   . Cancer Father        mets  . Drug abuse Mother   . Breast cancer Maternal Aunt   . Bipolar disorder Maternal Aunt   . Drug abuse Maternal Aunt   . Colon cancer Maternal Grandmother        late 50s, early 60s  . Drug abuse Sister   . Drug abuse Brother   . Bipolar disorder Paternal Grandfather   . Bipolar disorder Cousin   . Liver disease Neg Hx     Social History:  reports that she has been smoking Cigarettes.  She has a 17.50 pack-year smoking history. She has never used smokeless tobacco. She reports that she does not drink alcohol or use drugs.  Allergies:  Allergies  Allergen Reactions  . Codeine Hives  . Morphine And Related Hives  . Reglan [Metoclopramide] Other (See Comments)    EPS symptoms     Medications:  Scheduled: . amLODipine  5 mg Oral Daily  . aspirin EC  162 mg Oral Daily  . lisinopril  20 mg Oral Daily  . pantoprazole  40 mg Oral Daily    Results for orders placed or performed during the hospital encounter of 01/04/17 (from the past 48 hour(s))  Comprehensive metabolic panel     Status: Abnormal   Collection Time: 01/04/17  2:01 PM  Result Value Ref Range   Sodium 144 135 - 145 mmol/L   Potassium 4.2 3.5 - 5.1 mmol/L   Chloride 108 101 - 111 mmol/L   CO2 28 22 - 32 mmol/L   Glucose, Bld 89 65 - 99 mg/dL   BUN 7 6 - 20 mg/dL   Creatinine, Ser 0.41 (L) 0.44 - 1.00 mg/dL   Calcium  9.8 8.9 - 10.3 mg/dL   Total Protein 8.0 6.5 - 8.1 g/dL   Albumin 3.6 3.5 - 5.0 g/dL   AST 26 15 - 41 U/L   ALT 21 14 - 54 U/L   Alkaline Phosphatase 113 38 - 126 U/L   Total Bilirubin 0.4 0.3 - 1.2 mg/dL   GFR calc non Af Amer >60 >60 mL/min   GFR calc Af Amer >60 >60 mL/min    Comment: (NOTE) The eGFR has been calculated using the CKD EPI equation. This calculation has not been validated in all clinical situations. eGFR's persistently <60 mL/min signify possible   Chronic Kidney Disease.    Anion gap 8 5 - 15  CBC with Differential     Status: Abnormal   Collection Time: 01/04/17  2:01 PM  Result Value Ref Range   WBC 10.0 4.0 - 10.5 K/uL   RBC 4.11 3.87 - 5.11 MIL/uL   Hemoglobin 10.6 (L) 12.0 - 15.0 g/dL   HCT 31.1 (L) 36.0 - 46.0 %   MCV 75.7 (L) 78.0 - 100.0 fL   MCH 25.8 (L) 26.0 - 34.0 pg   MCHC 34.1 30.0 - 36.0 g/dL   RDW 17.1 (H) 11.5 - 15.5 %   Platelets 427 (H) 150 - 400 K/uL   Neutrophils Relative % 61 %   Neutro Abs 6.0 1.7 - 7.7 K/uL   Lymphocytes Relative 30 %   Lymphs Abs 3.0 0.7 - 4.0 K/uL   Monocytes Relative 5 %   Monocytes Absolute 0.5 0.1 - 1.0 K/uL   Eosinophils Relative 4 %   Eosinophils Absolute 0.4 0.0 - 0.7 K/uL   Basophils Relative 0 %   Basophils Absolute 0.0 0.0 - 0.1 K/uL  Lactic acid, plasma     Status: None   Collection Time: 01/04/17  2:01 PM  Result Value Ref Range   Lactic Acid, Venous 0.9 0.5 - 1.9 mmol/L  Urinalysis, Routine w reflex microscopic     Status: Abnormal   Collection Time: 01/04/17  6:59 PM  Result Value Ref Range   Color, Urine STRAW (A) YELLOW   APPearance CLEAR CLEAR   Specific Gravity, Urine 1.042 (H) 1.005 - 1.030   pH 6.0 5.0 - 8.0   Glucose, UA NEGATIVE NEGATIVE mg/dL   Hgb urine dipstick NEGATIVE NEGATIVE   Bilirubin Urine NEGATIVE NEGATIVE   Ketones, ur NEGATIVE NEGATIVE mg/dL   Protein, ur NEGATIVE NEGATIVE mg/dL   Nitrite NEGATIVE NEGATIVE   Leukocytes, UA NEGATIVE NEGATIVE  MRSA PCR Screening      Status: None   Collection Time: 01/04/17  7:18 PM  Result Value Ref Range   MRSA by PCR NEGATIVE NEGATIVE    Comment:        The GeneXpert MRSA Assay (FDA approved for NASAL specimens only), is one component of a comprehensive MRSA colonization surveillance program. It is not intended to diagnose MRSA infection nor to guide or monitor treatment for MRSA infections.   Basic metabolic panel     Status: Abnormal   Collection Time: 01/05/17  6:24 AM  Result Value Ref Range   Sodium 139 135 - 145 mmol/L   Potassium 3.3 (L) 3.5 - 5.1 mmol/L    Comment: DELTA CHECK NOTED   Chloride 106 101 - 111 mmol/L   CO2 26 22 - 32 mmol/L   Glucose, Bld 107 (H) 65 - 99 mg/dL   BUN 7 6 - 20 mg/dL   Creatinine, Ser 0.43 (L) 0.44 - 1.00 mg/dL   Calcium 8.9 8.9 - 10.3 mg/dL   GFR calc non Af Amer >60 >60 mL/min   GFR calc Af Amer >60 >60 mL/min    Comment: (NOTE) The eGFR has been calculated using the CKD EPI equation. This calculation has not been validated in all clinical situations. eGFR's persistently <60 mL/min signify possible Chronic Kidney Disease.    Anion gap 7 5 - 15  CBC     Status: Abnormal   Collection Time: 01/05/17  6:24 AM  Result Value Ref Range   WBC 7.3 4.0 - 10.5 K/uL   RBC 3.73 (L) 3.87 - 5.11 MIL/uL     Hemoglobin 9.7 (L) 12.0 - 15.0 g/dL   HCT 27.9 (L) 36.0 - 46.0 %   MCV 74.8 (L) 78.0 - 100.0 fL   MCH 26.0 26.0 - 34.0 pg   MCHC 34.8 30.0 - 36.0 g/dL   RDW 17.0 (H) 11.5 - 15.5 %   Platelets 423 (H) 150 - 400 K/uL    Dg Chest 2 View  Result Date: 01/04/2017 CLINICAL DATA:  Anterior abdominal draining wound. EXAM: CHEST  2 VIEW COMPARISON:  01/02/2017 and prior radiograph FINDINGS: Cardiomediastinal silhouette is unchanged. Mild pulmonary vascular congestion noted. Subsegmental atelectasis within the mid right lung noted. Mild bibasilar opacities may represent airspace disease/ pneumonia versus atelectasis. No pleural effusion or pneumothorax. IMPRESSION: Mild  bibasilar opacities -question airspace disease/pneumonia versus atelectasis. Mid right lung subsegmental atelectasis. Electronically Signed   By: Jeffrey  Hu M.D.   On: 01/04/2017 14:05   Ct Abdomen Pelvis W Contrast  Result Date: 01/04/2017 CLINICAL DATA:  Initial evaluation for acute abdominal infection. EXAM: CT ABDOMEN AND PELVIS WITH CONTRAST TECHNIQUE: Multidetector CT imaging of the abdomen and pelvis was performed using the standard protocol following bolus administration of intravenous contrast. CONTRAST:  100mL ISOVUE-300 IOPAMIDOL (ISOVUE-300) INJECTION 61% COMPARISON:  Prior CT from 01/02/2017. FINDINGS: Lower chest: 6 mm nodule partially visualize within the right middle lobe (series 8, image 1). Scattered atelectatic changes present within the visualized lung bases. Visualized lungs are otherwise clear. Hepatobiliary: Liver demonstrates a normal contrast enhanced appearance. Gallbladder surgically absent. Intra and extrahepatic biliary dilatation like related to post cholecystectomy changes. Pancreas: Pancreas somewhat atrophic in appearance but within normal limits. Spleen: Spleen within normal limits. Adrenals/Urinary Tract: 2 cm left adrenal nodule again noted, stable from previous. Right adrenal gland is surgically absent. Kidneys equal in size with symmetric enhancement. No nephrolithiasis, hydronephrosis, or focal enhancing renal mass. No hydroureter. Bladder largely decompressed without acute abnormality. Stomach/Bowel: Postsurgical changes noted about the GE junction and stomach. Stomach otherwise unremarkable. No evidence for bowel obstruction. Several loops of bowel appear intact CT and ear abdominal wall, like related underlying adhesive disease. No acute inflammatory changes seen about the bowels. Vascular/Lymphatic: Normal intravascular enhancement seen throughout the intra-abdominal aorta and and its branch vessels. Scattered aorto bi-iliac atherosclerotic disease. No aneurysm. No  enlarged intra-abdominopelvic lymph nodes. Reproductive: Uterus is absent.  Ovaries not discretely identified. Other: No free air or fluid. Musculoskeletal: Healing left superior and inferior pubic rami fractures again noted. No other acute osseous abnormality. No other worrisome lytic or blastic osseous lesions. Abnormal swelling with inflammatory stranding involving the subcutaneous fat of the right paramedian ventral abdominal wall again seen, worsened in appearance and increased in size relative to recent CT. Area of involvement now measures approximately 3.8 x 2.1 cm (series 7, image 50), previously 3.2 x 2.3 cm). Central hypodensity within this area suspicious for possible early abscess (series 7, image 50). Process largely involves the subcutaneous fat, although there is extension into the underlying rectus musculature. No extension into the underlying peritoneal in at this time. Additional postsurgical scarring along the midline and within the left ventral abdominal wall again noted, stable. IMPRESSION: 1. Interval worsening and inflammatory process involving the subcutaneous fat of the right paramedian ventral abdomen, increased in size as compared to recent CT from 01/02/2017. Developing central hypodensity within this area suspicious for early/developing abscess. No extension into the underlying peritoneum at this time. 2. No other new acute intra-abdominal or pelvic process. Electronically Signed   By: Benjamin  McClintock M.D.   On: 01/04/2017   18:01    ROS:  Pertinent items are noted in HPI.  Blood pressure 131/84, pulse 85, temperature 98.2 F (36.8 C), temperature source Oral, resp. rate 20, height 5' 3" (1.6 m), weight 136 lb 12.8 oz (62.1 kg), SpO2 95 %. Physical Exam: Pleasant black female in no acute distress Head is normocephalic, atraumatic Lungs clear auscultation with equal breath sounds bilaterally Heart examination reveals a regular rate and rhythm without S3, S4,  murmurs Abdominal wall reveals multiple surgical scars with a tender, erythematous swelling approximately 4-5 cm in its greatest diameter to the right of the midline surgical scar and superior to the umbilicus. No drainage is noted from this. She does have 2 draining sinuses below the umbilicus.  CT scan images personally reviewed  Assessment/Plan: Impression: Cellulitis with abscess, abdominal wall Plan: Will take patient to the operating room tomorrow for incision and drainage of abdominal wall abscess. The risks and benefits of the procedure were fully explained to the patient, who gave informed consent.  Angel Price 01/05/2017, 12:14 PM

## 2017-01-05 NOTE — Progress Notes (Signed)
Late entry:  Patient reports pain medication not helping.  Pain still 7/10.  Dr. Adair Patter gave order to change to 1-2 tablets every 6 hours as needed for pain.

## 2017-01-06 ENCOUNTER — Encounter (HOSPITAL_COMMUNITY): Payer: Self-pay

## 2017-01-06 ENCOUNTER — Inpatient Hospital Stay (HOSPITAL_COMMUNITY): Payer: Medicare HMO | Admitting: Anesthesiology

## 2017-01-06 ENCOUNTER — Encounter (HOSPITAL_COMMUNITY)
Admission: EM | Disposition: A | Discharge status: Home / Self Care | Payer: Self-pay | Source: Home / Self Care | Attending: Family Medicine

## 2017-01-06 HISTORY — PX: INCISION AND DRAINAGE ABSCESS: SHX5864

## 2017-01-06 LAB — CBC WITH DIFFERENTIAL/PLATELET
BASOS ABS: 0 10*3/uL (ref 0.0–0.1)
BASOS PCT: 0 %
EOS ABS: 0.3 10*3/uL (ref 0.0–0.7)
EOS PCT: 4 %
HCT: 29.2 % — ABNORMAL LOW (ref 36.0–46.0)
Hemoglobin: 9.8 g/dL — ABNORMAL LOW (ref 12.0–15.0)
Lymphocytes Relative: 39 %
Lymphs Abs: 2.5 10*3/uL (ref 0.7–4.0)
MCH: 25.4 pg — ABNORMAL LOW (ref 26.0–34.0)
MCHC: 33.6 g/dL (ref 30.0–36.0)
MCV: 75.6 fL — ABNORMAL LOW (ref 78.0–100.0)
Monocytes Absolute: 0.5 10*3/uL (ref 0.1–1.0)
Monocytes Relative: 7 %
Neutro Abs: 3.2 10*3/uL (ref 1.7–7.7)
Neutrophils Relative %: 50 %
PLATELETS: 409 10*3/uL — AB (ref 150–400)
RBC: 3.86 MIL/uL — AB (ref 3.87–5.11)
RDW: 17 % — ABNORMAL HIGH (ref 11.5–15.5)
WBC: 6.5 10*3/uL (ref 4.0–10.5)

## 2017-01-06 LAB — BASIC METABOLIC PANEL
Anion gap: 7 (ref 5–15)
BUN: 10 mg/dL (ref 6–20)
CO2: 25 mmol/L (ref 22–32)
CREATININE: 0.34 mg/dL — AB (ref 0.44–1.00)
Calcium: 9 mg/dL (ref 8.9–10.3)
Chloride: 107 mmol/L (ref 101–111)
Glucose, Bld: 98 mg/dL (ref 65–99)
Potassium: 3.6 mmol/L (ref 3.5–5.1)
SODIUM: 139 mmol/L (ref 135–145)

## 2017-01-06 SURGERY — INCISION AND DRAINAGE, ABSCESS
Anesthesia: General

## 2017-01-06 MED ORDER — HYDROMORPHONE HCL 1 MG/ML IJ SOLN
0.5000 mg | INTRAMUSCULAR | Status: AC | PRN
  Administered 2017-01-06 (×4): 0.5 mg via INTRAVENOUS
  Filled 2017-01-06 (×2): qty 1

## 2017-01-06 MED ORDER — LACTATED RINGERS IV SOLN
INTRAVENOUS | Status: DC
  Administered 2017-01-06: 15:00:00 via INTRAVENOUS

## 2017-01-06 MED ORDER — MIDAZOLAM HCL 2 MG/2ML IJ SOLN
1.0000 mg | Freq: Once | INTRAMUSCULAR | Status: AC | PRN
  Administered 2017-01-06: 2 mg via INTRAVENOUS

## 2017-01-06 MED ORDER — BUPIVACAINE HCL (PF) 0.5 % IJ SOLN
INTRAMUSCULAR | Status: DC | PRN
  Administered 2017-01-06: 10 mL

## 2017-01-06 MED ORDER — KETOROLAC TROMETHAMINE 30 MG/ML IJ SOLN
30.0000 mg | Freq: Four times a day (QID) | INTRAMUSCULAR | Status: DC | PRN
  Administered 2017-01-06 – 2017-01-07 (×2): 30 mg via INTRAVENOUS
  Filled 2017-01-06 (×2): qty 1

## 2017-01-06 MED ORDER — ONDANSETRON 4 MG PO TBDP
ORAL_TABLET | ORAL | Status: AC
  Filled 2017-01-06: qty 1

## 2017-01-06 MED ORDER — LIDOCAINE HCL (CARDIAC) 20 MG/ML IV SOLN
INTRAVENOUS | Status: DC | PRN
  Administered 2017-01-06: 30 mg via INTRAVENOUS

## 2017-01-06 MED ORDER — PROPOFOL 10 MG/ML IV BOLUS
INTRAVENOUS | Status: AC
  Filled 2017-01-06: qty 20

## 2017-01-06 MED ORDER — FENTANYL CITRATE (PF) 250 MCG/5ML IJ SOLN
INTRAMUSCULAR | Status: AC
  Filled 2017-01-06: qty 5

## 2017-01-06 MED ORDER — SODIUM CHLORIDE 0.9 % IR SOLN
Status: DC | PRN
  Administered 2017-01-06: 1000 mL

## 2017-01-06 MED ORDER — LACTATED RINGERS IV SOLN
INTRAVENOUS | Status: DC
  Administered 2017-01-06: 12:00:00 via INTRAVENOUS

## 2017-01-06 MED ORDER — BUPIVACAINE HCL (PF) 0.5 % IJ SOLN
INTRAMUSCULAR | Status: AC
  Filled 2017-01-06: qty 30

## 2017-01-06 MED ORDER — ONDANSETRON 4 MG PO TBDP
4.0000 mg | ORAL_TABLET | Freq: Once | ORAL | Status: AC
  Administered 2017-01-06: 4 mg via ORAL

## 2017-01-06 MED ORDER — FENTANYL CITRATE (PF) 100 MCG/2ML IJ SOLN
INTRAMUSCULAR | Status: DC | PRN
  Administered 2017-01-06 (×2): 50 ug via INTRAVENOUS
  Administered 2017-01-06: 25 ug via INTRAVENOUS
  Administered 2017-01-06: 50 ug via INTRAVENOUS
  Administered 2017-01-06: 25 ug via INTRAVENOUS

## 2017-01-06 MED ORDER — MIDAZOLAM HCL 2 MG/2ML IJ SOLN
INTRAMUSCULAR | Status: AC
  Filled 2017-01-06: qty 2

## 2017-01-06 MED ORDER — PROPOFOL 10 MG/ML IV BOLUS
INTRAVENOUS | Status: DC | PRN
Start: 2017-01-06 — End: 2017-01-06
  Administered 2017-01-06: 150 mg via INTRAVENOUS

## 2017-01-06 MED ORDER — LIDOCAINE HCL (PF) 1 % IJ SOLN
INTRAMUSCULAR | Status: AC
  Filled 2017-01-06: qty 5

## 2017-01-06 SURGICAL SUPPLY — 26 items
BAG HAMPER (MISCELLANEOUS) ×2 IMPLANT
CLOTH BEACON ORANGE TIMEOUT ST (SAFETY) ×2 IMPLANT
COVER LIGHT HANDLE STERIS (MISCELLANEOUS) ×4 IMPLANT
ELECT REM PT RETURN 9FT ADLT (ELECTROSURGICAL) ×2
ELECTRODE REM PT RTRN 9FT ADLT (ELECTROSURGICAL) ×1 IMPLANT
GAUZE SPONGE 4X4 12PLY STRL (GAUZE/BANDAGES/DRESSINGS) ×2 IMPLANT
GLOVE BIOGEL PI IND STRL 7.0 (GLOVE) ×1 IMPLANT
GLOVE BIOGEL PI INDICATOR 7.0 (GLOVE) ×2
GLOVE ECLIPSE 6.5 STRL STRAW (GLOVE) ×1 IMPLANT
GLOVE SURG SS PI 7.5 STRL IVOR (GLOVE) ×2 IMPLANT
GOWN STRL REUS W/TWL LRG LVL3 (GOWN DISPOSABLE) ×4 IMPLANT
KIT ROOM TURNOVER APOR (KITS) ×2 IMPLANT
MANIFOLD NEPTUNE II (INSTRUMENTS) ×2 IMPLANT
NDL HYPO 25X1 1.5 SAFETY (NEEDLE) IMPLANT
NEEDLE HYPO 25X1 1.5 SAFETY (NEEDLE) ×2 IMPLANT
NS IRRIG 1000ML POUR BTL (IV SOLUTION) ×2 IMPLANT
PACK MINOR (CUSTOM PROCEDURE TRAY) ×2 IMPLANT
PAD ABD 5X9 TENDERSORB (GAUZE/BANDAGES/DRESSINGS) ×1 IMPLANT
PAD ARMBOARD 7.5X6 YLW CONV (MISCELLANEOUS) ×2 IMPLANT
SET BASIN LINEN APH (SET/KITS/TRAYS/PACK) ×2 IMPLANT
STAPLER VISISTAT (STAPLE) ×1 IMPLANT
SWAB CULTURE ESWAB REG 1ML (MISCELLANEOUS) ×1 IMPLANT
SWAB CULTURE LIQ STUART DBL (MISCELLANEOUS) ×1 IMPLANT
SYR BULB IRRIGATION 50ML (SYRINGE) ×2 IMPLANT
SYR CONTROL 10ML LL (SYRINGE) ×1 IMPLANT
TAPE CLOTH SURG 4X10 WHT LF (GAUZE/BANDAGES/DRESSINGS) ×1 IMPLANT

## 2017-01-06 NOTE — Anesthesia Procedure Notes (Signed)
Procedure Name: LMA Insertion Date/Time: 01/06/2017 11:46 AM Performed by: Andree Elk, AMY A Pre-anesthesia Checklist: Patient identified, Emergency Drugs available, Suction available, Patient being monitored and Timeout performed Patient Re-evaluated:Patient Re-evaluated prior to induction Oxygen Delivery Method: Circle system utilized Preoxygenation: Pre-oxygenation with 100% oxygen Induction Type: IV induction Ventilation: Mask ventilation without difficulty LMA: LMA inserted LMA Size: 4.0 Number of attempts: 1 Placement Confirmation: positive ETCO2 and breath sounds checked- equal and bilateral Tube secured with: Tape Dental Injury: Teeth and Oropharynx as per pre-operative assessment

## 2017-01-06 NOTE — Progress Notes (Signed)
PROGRESS NOTE    Angel Price  HDQ:[id number] DOB: [birth year] DOA: 01/04/2017 PCP: Rosita Fire, MD    Brief Narrative:   CORTASIA SCREWS is a 51 y.o. female with a history of essential hypertension, anxiety, history of gastrectomy with Roux-en-Y anastomosis, history of Billroth II surgeries, history of abdominal wall abscesses, GERD, gastroparesis.  Patient seen 2 days ago with abdominal pain and drainage of abdominal wall wound that started approximately a week ago.  She was admitted at [facility] and based on vancomycin and Zosyn.  Patient was discharged on Doxy yesterday.  Since discharge, the patient continued to take her antibiotics, however her wound worsened and she has had increased purulence and increased erythema.  She denies fevers, chills.  She has baseline chronic nausea and intermittent diarrhea, which are not worsened.  Denies chest pain or shortness of breath.  Emergency Department Course: Received dose of IV vancomycin.  CT scan shows worsening abdominal wall cellulitis involving the subcutaneous fat in the right paramedian ventral abdomen which is increased in size from 10/18 with concerns of early developing abscess.  Patient was seen by general surgery and underwent incision and drainage on 10/22.  Cultures were sent from the operating room.  She will be continued on doxycycline.    Assessment & Plan:   Principal Problem:   Abdominal wall abscess Active Problems:   History of gastric bypass   Essential hypertension, benign   Gastroparesis   History of Billroth II operation   Abdominal wall abscess  Incision and drainage performed today  F/u cultures  Toradol, norco for pain  Consider diluadid if pain not well controlled  Will likely d/c to continue doxycycline outpatient  History of gastric bypass, history of Billroth II operation  Microcytic Anemia - appears chronic  Essential hypertension - Continue  antihypertensives  Gastroparesis - no complaints voiced  DVT prophylaxis: SCDs Code Status: Full Family Communication: None Disposition Plan: likely discharge tomorrow    Consultants:   General Surgery  Procedures:   None  Antimicrobials:   Vancomycin    Subjective: Seen pre- op.  She voices pain was not well controlled last night.  She is hopeful surgery will help control some of her pain and her infection.  Denies any other symptoms.  Objective: Vitals:   01/06/17 1245 01/06/17 1253 01/06/17 1300 01/06/17 1312  BP: 106/70  110/85 129/72  Pulse: 89 76 70 69  Resp: 14 14 19 18   Temp:    97.8 F (36.6 C)  TempSrc:      SpO2: 93% 94% 96% 97%  Weight:      Height:        Intake/Output Summary (Last 24 hours) at 01/06/17 1346 Last data filed at 01/06/17 1234  Gross per 24 hour  Intake              450 ml  Output              510 ml  Net              -60 ml   Filed Weights   01/04/17 1240 01/04/17 2059  Weight: 62.6 kg (138 lb) 62.1 kg (136 lb 12.8 oz)    Examination:  General exam: Appears calm and comfortable  Respiratory system: Clear to auscultation. Respiratory effort normal. Cardiovascular system: S1 & S2 heard, RRR. No JVD, murmurs, rubs, gallops or clicks. No pedal edema. Gastrointestinal system: Numerous scars on abdomen. Abdomen is nondistended, soft and tender periumbilically. No  organomegaly or masses felt. Normal bowel sounds heard. Central nervous system: Alert and oriented. No focal neurological deficits. Extremities: Symmetric 5 x 5 power. Skin: numerous scars on abdomen, open area below umbilicus that is draining Psychiatry: Judgement and insight appear normal. Mood & affect appropriate.  Data Reviewed: I have personally reviewed following labs and imaging studies  CBC:  Recent Labs Lab 01/02/17 1350 01/03/17 0354 01/04/17 1401 01/05/17 0624 01/06/17 0928  WBC 11.5* 8.1 10.0 7.3 6.5  NEUTROABS 6.2  --  6.0  --  3.2  HGB 10.9*  9.1* 10.6* 9.7* 9.8*  HCT 31.3* 26.9* 31.1* 27.9* 29.2*  MCV 75.1* 74.7* 75.7* 74.8* 75.6*  PLT 457* 412* 427* 423* 409*   Basic Metabolic Panel:  Recent Labs Lab 01/02/17 1350 01/03/17 0354 01/04/17 1401 01/05/17 0624 01/06/17 0928  NA 135 139 144 139 139  K 2.7* 3.7 4.2 3.3* 3.6  CL 105 110 108 106 107  CO2 17* 22 28 26 25   GLUCOSE 228* 81 89 107* 98  BUN 7 7 7 7 10   CREATININE 0.48 0.48 0.41* 0.43* 0.34*  CALCIUM 8.8* 8.7* 9.8 8.9 9.0   GFR: Estimated Creatinine Clearance: 70.4 mL/min (A) (by C-G formula based on SCr of 0.34 mg/dL (L)). Liver Function Tests:  Recent Labs Lab 01/02/17 1350 01/04/17 1401  AST 23 26  ALT 20 21  ALKPHOS 121 113  BILITOT 0.3 0.4  PROT 7.4 8.0  ALBUMIN 3.3* 3.6    Recent Labs Lab 01/02/17 1350  LIPASE 21   No results for input(s): AMMONIA in the last 168 hours. Coagulation Profile: No results for input(s): INR, PROTIME in the last 168 hours. Cardiac Enzymes: No results for input(s): CKTOTAL, CKMB, CKMBINDEX, TROPONINI in the last 168 hours. BNP (last 3 results) No results for input(s): PROBNP in the last 8760 hours. HbA1C: No results for input(s): HGBA1C in the last 72 hours. CBG: No results for input(s): GLUCAP in the last 168 hours. Lipid Profile: No results for input(s): CHOL, HDL, LDLCALC, TRIG, CHOLHDL, LDLDIRECT in the last 72 hours. Thyroid Function Tests: No results for input(s): TSH, T4TOTAL, FREET4, T3FREE, THYROIDAB in the last 72 hours. Anemia Panel: No results for input(s): VITAMINB12, FOLATE, FERRITIN, TIBC, IRON, RETICCTPCT in the last 72 hours. Sepsis Labs:  Recent Labs Lab 01/02/17 1401 01/02/17 1930 01/04/17 1401  LATICACIDVEN 3.89* 0.92 0.9    Recent Results (from the past 240 hour(s))  Blood Culture (routine x 2)     Status: None (Preliminary result)   Collection Time: 01/02/17  5:38 PM  Result Value Ref Range Status   Specimen Description BLOOD BLOOD LEFT FOREARM  Final   Special Requests    Final    BOTTLES DRAWN AEROBIC AND ANAEROBIC Blood Culture adequate volume   Culture NO GROWTH 4 DAYS  Final   Report Status PENDING  Incomplete  Blood Culture (routine x 2)     Status: None (Preliminary result)   Collection Time: 01/02/17  5:50 PM  Result Value Ref Range Status   Specimen Description BLOOD RIGHT WRIST  Final   Special Requests   Final    BOTTLES DRAWN AEROBIC AND ANAEROBIC Blood Culture adequate volume   Culture NO GROWTH 4 DAYS  Final   Report Status PENDING  Incomplete  MRSA PCR Screening     Status: None   Collection Time: 01/04/17  7:18 PM  Result Value Ref Range Status   MRSA by PCR NEGATIVE NEGATIVE Final    Comment:  The GeneXpert MRSA Assay (FDA approved for NASAL specimens only), is one component of a comprehensive MRSA colonization surveillance program. It is not intended to diagnose MRSA infection nor to guide or monitor treatment for MRSA infections.   Wound or Superficial Culture     Status: None (Preliminary result)   Collection Time: 01/04/17  7:35 PM  Result Value Ref Range Status   Specimen Description ABDOMEN  Final   Special Requests NONE  Final   Gram Stain   Final    RARE WBC PRESENT, PREDOMINANTLY PMN NO ORGANISMS SEEN    Culture   Final    CULTURE REINCUBATED FOR BETTER GROWTH Performed at [facility], [place] [address] [place], [place] [address]    Report Status PENDING  Incomplete  Surgical PCR screen     Status: None   Collection Time: 01/05/17  4:11 PM  Result Value Ref Range Status   MRSA, PCR NEGATIVE NEGATIVE Final   Staphylococcus aureus NEGATIVE NEGATIVE Final    Comment: (NOTE) The Xpert SA Assay (FDA approved for NASAL specimens in patients 47 years of age and older), is one component of a comprehensive surveillance program. It is not intended to diagnose infection nor to guide or monitor treatment.          Radiology Studies: Dg Chest 2 View  Result Date: 01/04/2017 CLINICAL DATA:  Anterior  abdominal draining wound. EXAM: CHEST  2 VIEW COMPARISON:  01/02/2017 and prior radiograph FINDINGS: Cardiomediastinal silhouette is unchanged. Mild pulmonary vascular congestion noted. Subsegmental atelectasis within the mid right lung noted. Mild bibasilar opacities may represent airspace disease/ pneumonia versus atelectasis. No pleural effusion or pneumothorax. IMPRESSION: Mild bibasilar opacities -question airspace disease/pneumonia versus atelectasis. Mid right lung subsegmental atelectasis. Electronically Signed   By: Margarette Canada M.D.   On: 01/04/2017 14:05   Ct Abdomen Pelvis W Contrast  Result Date: 01/04/2017 CLINICAL DATA:  Initial evaluation for acute abdominal infection. EXAM: CT ABDOMEN AND PELVIS WITH CONTRAST TECHNIQUE: Multidetector CT imaging of the abdomen and pelvis was performed using the standard protocol following bolus administration of intravenous contrast. CONTRAST:  111mL ISOVUE-300 IOPAMIDOL (ISOVUE-300) INJECTION 61% COMPARISON:  Prior CT from 01/02/2017. FINDINGS: Lower chest: 6 mm nodule partially visualize within the right middle lobe (series 8, image 1). Scattered atelectatic changes present within the visualized lung bases. Visualized lungs are otherwise clear. Hepatobiliary: Liver demonstrates a normal contrast enhanced appearance. Gallbladder surgically absent. Intra and extrahepatic biliary dilatation like related to post cholecystectomy changes. Pancreas: Pancreas somewhat atrophic in appearance but within normal limits. Spleen: Spleen within normal limits. Adrenals/Urinary Tract: 2 cm left adrenal nodule again noted, stable from previous. Right adrenal gland is surgically absent. Kidneys equal in size with symmetric enhancement. No nephrolithiasis, hydronephrosis, or focal enhancing renal mass. No hydroureter. Bladder largely decompressed without acute abnormality. Stomach/Bowel: Postsurgical changes noted about the GE junction and stomach. Stomach otherwise unremarkable.  No evidence for bowel obstruction. Several loops of bowel appear intact CT and ear abdominal wall, like related underlying adhesive disease. No acute inflammatory changes seen about the bowels. Vascular/Lymphatic: Normal intravascular enhancement seen throughout the intra-abdominal aorta and and its branch vessels. Scattered aorto bi-iliac atherosclerotic disease. No aneurysm. No enlarged intra-abdominopelvic lymph nodes. Reproductive: Uterus is absent.  Ovaries not discretely identified. Other: No free air or fluid. Musculoskeletal: Healing left superior and inferior pubic rami fractures again noted. No other acute osseous abnormality. No other worrisome lytic or blastic osseous lesions. Abnormal swelling with inflammatory stranding involving the subcutaneous  fat of the right paramedian ventral abdominal wall again seen, worsened in appearance and increased in size relative to recent CT. Area of involvement now measures approximately 3.8 x 2.1 cm (series 7, image 50), previously 3.2 x 2.3 cm). Central hypodensity within this area suspicious for possible early abscess (series 7, image 50). Process largely involves the subcutaneous fat, although there is extension into the underlying rectus musculature. No extension into the underlying peritoneal in at this time. Additional postsurgical scarring along the midline and within the left ventral abdominal wall again noted, stable. IMPRESSION: 1. Interval worsening and inflammatory process involving the subcutaneous fat of the right paramedian ventral abdomen, increased in size as compared to recent CT from 01/02/2017. Developing central hypodensity within this area suspicious for early/developing abscess. No extension into the underlying peritoneum at this time. 2. No other new acute intra-abdominal or pelvic process. Electronically Signed   By: Jeannine Boga M.D.   On: 01/04/2017 18:01        Scheduled Meds: . amLODipine  5 mg Oral Daily  . aspirin EC   162 mg Oral Daily  . lisinopril  20 mg Oral Daily  . pantoprazole  40 mg Oral Daily  . potassium chloride  20 mEq Oral BID   Continuous Infusions: . lactated ringers    . vancomycin Stopped (01/06/17 8828)     LOS: 2 days    Time spent: 30 minutes    Loretha Stapler, MD [facility] Pager [phone]  If 7PM-7AM, please contact night-coverage [email or web address] Password [email or web address] 01/06/2017, 1:46 PM

## 2017-01-06 NOTE — Anesthesia Postprocedure Evaluation (Signed)
Anesthesia Post Note  Patient: Angel Price  Procedure(s) Performed: INCISION AND DRAINAGE ABDOMINAL WALL ABSCESS (N/A )  Patient location during evaluation: Short Stay Anesthesia Type: General Level of consciousness: awake and alert Pain management: satisfactory to patient Vital Signs Assessment: post-procedure vital signs reviewed and stable Respiratory status: spontaneous breathing Cardiovascular status: stable Postop Assessment: no apparent nausea or vomiting Anesthetic complications: no     Last Vitals:  Vitals:   01/06/17 1300 01/06/17 1312  BP: 110/85 129/72  Pulse: 70 69  Resp: 19 18  Temp:  36.6 C  SpO2: 96% 97%    Last Pain:  Vitals:   01/06/17 1312  TempSrc:   PainSc: 9                  Loyd Salvador

## 2017-01-06 NOTE — Transfer of Care (Signed)
Immediate Anesthesia Transfer of Care Note  Patient: Angel Price  Procedure(s) Performed: INCISION AND DRAINAGE ABDOMINAL WALL ABSCESS (N/A )  Patient Location: PACU  Anesthesia Type:General  Level of Consciousness: awake, alert , oriented and patient cooperative  Airway & Oxygen Therapy: Patient Spontanous Breathing  Post-op Assessment: Report given to RN and Post -op Vital signs reviewed and stable  Post vital signs: Reviewed and stable  Last Vitals:  Vitals:   01/06/17 1108 01/06/17 1126  BP: (!) 156/103 (!) 149/104  Pulse: 81 70  Resp: 18 16  Temp:    SpO2: 96% 96%    Last Pain:  Vitals:   01/06/17 1056  TempSrc: Oral  PainSc: 8       Patients Stated Pain Goal: 2 (58/85/02 7741)  Complications: No apparent anesthesia complications

## 2017-01-06 NOTE — Interval H&P Note (Signed)
History and Physical Interval Note:  01/06/2017 11:24 AM  Angel Price  has presented today for surgery, with the diagnosis of abdominal wall abscess  The various methods of treatment have been discussed with the patient and family. After consideration of risks, benefits and other options for treatment, the patient has consented to  Procedure(s): INCISION AND DRAINAGE ABSCESS (N/A) as a surgical intervention .  The patient's history has been reviewed, patient examined, no change in status, stable for surgery.  I have reviewed the patient's chart and labs.  Questions were answered to the patient's satisfaction.     Aviva Signs

## 2017-01-06 NOTE — H&P (View-Only) (Signed)
Reason for Consult: Abdominal wall cellulitis Referring Physician: Dr. Marcellina Millin is an 51 y.o. female.  HPI: Patient is a 51 year old black female with multiple medical problems who I have seen in the past for abdominal wall abscesses. She has a complicated surgical history. The source of her recurrent abscesses has never really been found. She has been seen by multiple surgeons and plastic surgery, though patient has been noncompliant. She was recently seen at [facility] and discharged on doxycycline. She presents back with worsening pain and swelling. Her cellulitis is in a different area of the the abdominal wall than what I have taken care of in the past. She currently has a pain of 8 out of 10.  Past Medical History:  Diagnosis Date  . Abscess    soft tissue  . Adrenal mass ([place])   . Alcohol abuse   . Anxiety   . Blood transfusion without reported diagnosis   . Chronic abdominal pain   . Chronic wound infection of abdomen   . Colon polyp    colonoscopy 04/2014  . Depression   . Diverticulosis    colonoscopy 04/2014 moderat pan colonic  . Gastritis    EGD 05/2014  . Gastroparesis Nov 2015  . GERD (gastroesophageal reflux disease)   . Hemorrhoid    internal large  . Hiatal hernia   . History of Billroth II operation   . Hypertension   . Lung nodule    CT 02/2014 needs repeat 1 month  . Lung nodule < 6cm on CT 04/25/2014  . Lupus   . Nausea and vomiting    chronic, recurrent  . Pancreatitis   . Schatzki's ring    patent per EGD 04/2014  . Sickle cell trait ([place])   . Suicide attempt ([place])   . 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 ABDOMINAL HYSTERECTOMY  2013   [place]  . ABDOMINAL SURGERY    . ADRENALECTOMY Right   . AGILE CAPSULE N/A 01/05/2015   Procedure: AGILE CAPSULE;  Surgeon: Daneil Dolin, MD;  Location: [facility];  Service: Endoscopy;  Laterality: N/A;  0700  . Billroth II  procedure      [place], first 2000, 2005/2006.  Marland Kitchen BIOPSY  05/20/2013   Procedure: BIOPSIES OF ASCENDING AND SIGMOID COLON;  Surgeon: Daneil Dolin, MD;  Location: [facility];  Service: Endoscopy;;  . BIOPSY  04/26/2014   Procedure: BIOPSIES;  Surgeon: Danie Binder, MD;  Location: [facility];  Service: Endoscopy;;  . CHOLECYSTECTOMY    . COLONOSCOPY     in [place]  . COLONOSCOPY WITH PROPOFOL N/A 05/20/2013   Dr.Rourk- inadequate prep, normal appearing rectum, grossly normal colon aside from pancolonic diverticula, normal terminal ileum bx= unremarkable colonic mucosa. Due for early interval 2016.   Marland Kitchen COLONOSCOPY WITH PROPOFOL N/A 04/26/2014   BOF:BPZWCH ileum/one colon polyp removed/moderate pan-colonic diverticulosis/large internal hemorrhoids  . COLONOSCOPY WITH PROPOFOL N/A 12/23/2014   Dr.Rourk- minimal internal hemorrhoids, pancolonic diverticulosis  . COLONOSCOPY WITH PROPOFOL N/A 08/23/2016   Procedure: COLONOSCOPY WITH PROPOFOL;  Surgeon: Danie Binder, MD;  [facility];  Service: Endoscopy;  Laterality: N/A;  . DEBRIDEMENT OF ABDOMINAL WALL ABSCESS N/A 02/08/2013   Procedure: DEBRIDEMENT OF ABDOMINAL WALL ABSCESS;  Surgeon: Jamesetta So, MD;  Location: [facility];  Service: General;  Laterality: N/A;  . ESOPHAGOGASTRODUODENOSCOPY (EGD) WITH PROPOFOL N/A 05/20/2013   Dr.Rourk- s/p  prior gastric surgery with normal esophagus, residual gastric mucosa and patent efferent limb  . ESOPHAGOGASTRODUODENOSCOPY (EGD) WITH PROPOFOL N/A 02/03/2014   Dr. Gala Romney:  s/p hemigastrectomy with retained gastric contents. Residual gastric mucosa and efferent limb appeared normal otherwise. Query gastroparesis.   Marland Kitchen ESOPHAGOGASTRODUODENOSCOPY (EGD) WITH PROPOFOL N/A 04/26/2014   HUT:[id number] ring/small HH/mild non-erosive gasrtitis/normal anastomosis  . ESOPHAGOGASTRODUODENOSCOPY (EGD) WITH PROPOFOL N/A 12/23/2014   Dr.Rourk- s/p prior hemigastrctomy, active oozing from anastomotic suture site, hemostasis  achieved  . ESOPHAGOGASTRODUODENOSCOPY (EGD) WITH PROPOFOL N/A 05/23/2016   Dr. 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 tendon repar Right    wrist  . WOUND EXPLORATION Right 06/24/2013   Procedure: exploration of traumatic wound right wrist;  Surgeon: Tennis Must, MD;  Location: [place];  Service: Orthopedics;  Laterality: Right;    Family History  Problem Relation Age of Onset  . Brain cancer Son   . Schizophrenia Son   . Cancer Son        brain  . Lung cancer Father   . Cancer Father        mets  . Drug abuse Mother   . Breast cancer Maternal Aunt   . Bipolar disorder Maternal Aunt   . Drug abuse Maternal Aunt   . Colon cancer Maternal Grandmother        late 44s, early 58s  . Drug abuse Sister   . Drug abuse Brother   . Bipolar disorder Paternal Grandfather   . Bipolar disorder Cousin   . Liver disease Neg Hx     Social History:  reports that she has been smoking Cigarettes.  She has a 17.50 pack-year smoking history. She has never used smokeless tobacco. She reports that she does not drink alcohol or use drugs.  Allergies:  Allergies  Allergen Reactions  . Codeine Hives  . Morphine And Related Hives  . Reglan [Metoclopramide] Other (See Comments)    EPS symptoms     Medications:  Scheduled: . amLODipine  5 mg Oral Daily  . aspirin EC  162 mg Oral Daily  . lisinopril  20 mg Oral Daily  . pantoprazole  40 mg Oral Daily    Results for orders placed or performed during the hospital encounter of 01/04/17 (from the past 48 hour(s))  Comprehensive metabolic panel     Status: Abnormal   Collection Time: 01/04/17  2:01 PM  Result Value Ref Range   Sodium 144 135 - 145 mmol/L   Potassium 4.2 3.5 - 5.1 mmol/L   Chloride 108 101 - 111 mmol/L   CO2 28 22 - 32 mmol/L   Glucose, Bld 89 65 - 99 mg/dL   BUN 7 6 - 20 mg/dL   Creatinine, Ser 0.41 (L) 0.44 - 1.00 mg/dL   Calcium  9.8 8.9 - 10.3 mg/dL   Total Protein 8.0 6.5 - 8.1 g/dL   Albumin 3.6 3.5 - 5.0 g/dL   AST 26 15 - 41 U/L   ALT 21 14 - 54 U/L   Alkaline Phosphatase 113 38 - 126 U/L   Total Bilirubin 0.4 0.3 - 1.2 mg/dL   GFR calc non Af Amer >60 >60 mL/min   GFR calc Af Amer >60 >60 mL/min    Comment: (NOTE) The eGFR has been calculated using the CKD EPI equation. This calculation has not been validated in all clinical situations. eGFR's persistently <60 mL/min signify possible  Chronic Kidney Disease.    Anion gap 8 5 - 15  CBC with Differential     Status: Abnormal   Collection Time: 01/04/17  2:01 PM  Result Value Ref Range   WBC 10.0 4.0 - 10.5 K/uL   RBC 4.11 3.87 - 5.11 MIL/uL   Hemoglobin 10.6 (L) 12.0 - 15.0 g/dL   HCT 31.1 (L) 36.0 - 46.0 %   MCV 75.7 (L) 78.0 - 100.0 fL   MCH 25.8 (L) 26.0 - 34.0 pg   MCHC 34.1 30.0 - 36.0 g/dL   RDW 17.1 (H) 11.5 - 15.5 %   Platelets 427 (H) 150 - 400 K/uL   Neutrophils Relative % 61 %   Neutro Abs 6.0 1.7 - 7.7 K/uL   Lymphocytes Relative 30 %   Lymphs Abs 3.0 0.7 - 4.0 K/uL   Monocytes Relative 5 %   Monocytes Absolute 0.5 0.1 - 1.0 K/uL   Eosinophils Relative 4 %   Eosinophils Absolute 0.4 0.0 - 0.7 K/uL   Basophils Relative 0 %   Basophils Absolute 0.0 0.0 - 0.1 K/uL  Lactic acid, plasma     Status: None   Collection Time: 01/04/17  2:01 PM  Result Value Ref Range   Lactic Acid, Venous 0.9 0.5 - 1.9 mmol/L  Urinalysis, Routine w reflex microscopic     Status: Abnormal   Collection Time: 01/04/17  6:59 PM  Result Value Ref Range   Color, Urine STRAW (A) YELLOW   APPearance CLEAR CLEAR   Specific Gravity, Urine 1.042 (H) 1.005 - 1.030   pH 6.0 5.0 - 8.0   Glucose, UA NEGATIVE NEGATIVE mg/dL   Hgb urine dipstick NEGATIVE NEGATIVE   Bilirubin Urine NEGATIVE NEGATIVE   Ketones, ur NEGATIVE NEGATIVE mg/dL   Protein, ur NEGATIVE NEGATIVE mg/dL   Nitrite NEGATIVE NEGATIVE   Leukocytes, UA NEGATIVE NEGATIVE  MRSA PCR Screening      Status: None   Collection Time: 01/04/17  7:18 PM  Result Value Ref Range   MRSA by PCR NEGATIVE NEGATIVE    Comment:        The GeneXpert MRSA Assay (FDA approved for NASAL specimens only), is one component of a comprehensive MRSA colonization surveillance program. It is not intended to diagnose MRSA infection nor to guide or monitor treatment for MRSA infections.   Basic metabolic panel     Status: Abnormal   Collection Time: 01/05/17  6:24 AM  Result Value Ref Range   Sodium 139 135 - 145 mmol/L   Potassium 3.3 (L) 3.5 - 5.1 mmol/L    Comment: DELTA CHECK NOTED   Chloride 106 101 - 111 mmol/L   CO2 26 22 - 32 mmol/L   Glucose, Bld 107 (H) 65 - 99 mg/dL   BUN 7 6 - 20 mg/dL   Creatinine, Ser 0.43 (L) 0.44 - 1.00 mg/dL   Calcium 8.9 8.9 - 10.3 mg/dL   GFR calc non Af Amer >60 >60 mL/min   GFR calc Af Amer >60 >60 mL/min    Comment: (NOTE) The eGFR has been calculated using the CKD EPI equation. This calculation has not been validated in all clinical situations. eGFR's persistently <60 mL/min signify possible Chronic Kidney Disease.    Anion gap 7 5 - 15  CBC     Status: Abnormal   Collection Time: 01/05/17  6:24 AM  Result Value Ref Range   WBC 7.3 4.0 - 10.5 K/uL   RBC 3.73 (L) 3.87 - 5.11 MIL/uL  Hemoglobin 9.7 (L) 12.0 - 15.0 g/dL   HCT 27.9 (L) 36.0 - 46.0 %   MCV 74.8 (L) 78.0 - 100.0 fL   MCH 26.0 26.0 - 34.0 pg   MCHC 34.8 30.0 - 36.0 g/dL   RDW 17.0 (H) 11.5 - 15.5 %   Platelets 423 (H) 150 - 400 K/uL    Dg Chest 2 View  Result Date: 01/04/2017 CLINICAL DATA:  Anterior abdominal draining wound. EXAM: CHEST  2 VIEW COMPARISON:  01/02/2017 and prior radiograph FINDINGS: Cardiomediastinal silhouette is unchanged. Mild pulmonary vascular congestion noted. Subsegmental atelectasis within the mid right lung noted. Mild bibasilar opacities may represent airspace disease/ pneumonia versus atelectasis. No pleural effusion or pneumothorax. IMPRESSION: Mild  bibasilar opacities -question airspace disease/pneumonia versus atelectasis. Mid right lung subsegmental atelectasis. Electronically Signed   By: Margarette Canada M.D.   On: 01/04/2017 14:05   Ct Abdomen Pelvis W Contrast  Result Date: 01/04/2017 CLINICAL DATA:  Initial evaluation for acute abdominal infection. EXAM: CT ABDOMEN AND PELVIS WITH CONTRAST TECHNIQUE: Multidetector CT imaging of the abdomen and pelvis was performed using the standard protocol following bolus administration of intravenous contrast. CONTRAST:  175m ISOVUE-300 IOPAMIDOL (ISOVUE-300) INJECTION 61% COMPARISON:  Prior CT from 01/02/2017. FINDINGS: Lower chest: 6 mm nodule partially visualize within the right middle lobe (series 8, image 1). Scattered atelectatic changes present within the visualized lung bases. Visualized lungs are otherwise clear. Hepatobiliary: Liver demonstrates a normal contrast enhanced appearance. Gallbladder surgically absent. Intra and extrahepatic biliary dilatation like related to post cholecystectomy changes. Pancreas: Pancreas somewhat atrophic in appearance but within normal limits. Spleen: Spleen within normal limits. Adrenals/Urinary Tract: 2 cm left adrenal nodule again noted, stable from previous. Right adrenal gland is surgically absent. Kidneys equal in size with symmetric enhancement. No nephrolithiasis, hydronephrosis, or focal enhancing renal mass. No hydroureter. Bladder largely decompressed without acute abnormality. Stomach/Bowel: Postsurgical changes noted about the GE junction and stomach. Stomach otherwise unremarkable. No evidence for bowel obstruction. Several loops of bowel appear intact CT and ear abdominal wall, like related underlying adhesive disease. No acute inflammatory changes seen about the bowels. Vascular/Lymphatic: Normal intravascular enhancement seen throughout the intra-abdominal aorta and and its branch vessels. Scattered aorto bi-iliac atherosclerotic disease. No aneurysm. No  enlarged intra-abdominopelvic lymph nodes. Reproductive: Uterus is absent.  Ovaries not discretely identified. Other: No free air or fluid. Musculoskeletal: Healing left superior and inferior pubic rami fractures again noted. No other acute osseous abnormality. No other worrisome lytic or blastic osseous lesions. Abnormal swelling with inflammatory stranding involving the subcutaneous fat of the right paramedian ventral abdominal wall again seen, worsened in appearance and increased in size relative to recent CT. Area of involvement now measures approximately 3.8 x 2.1 cm (series 7, image 50), previously 3.2 x 2.3 cm). Central hypodensity within this area suspicious for possible early abscess (series 7, image 50). Process largely involves the subcutaneous fat, although there is extension into the underlying rectus musculature. No extension into the underlying peritoneal in at this time. Additional postsurgical scarring along the midline and within the left ventral abdominal wall again noted, stable. IMPRESSION: 1. Interval worsening and inflammatory process involving the subcutaneous fat of the right paramedian ventral abdomen, increased in size as compared to recent CT from 01/02/2017. Developing central hypodensity within this area suspicious for early/developing abscess. No extension into the underlying peritoneum at this time. 2. No other new acute intra-abdominal or pelvic process. Electronically Signed   By: BJeannine BogaM.D.   On: 01/04/2017  18:01    ROS:  Pertinent items are noted in HPI.  Blood pressure 131/84, pulse 85, temperature 98.2 F (36.8 C), temperature source Oral, resp. rate 20, height 5' 3" (1.6 m), weight 136 lb 12.8 oz (62.1 kg), SpO2 95 %. Physical Exam: Pleasant black female in no acute distress Head is normocephalic, atraumatic Lungs clear auscultation with equal breath sounds bilaterally Heart examination reveals a regular rate and rhythm without S3, S4,  murmurs Abdominal wall reveals multiple surgical scars with a tender, erythematous swelling approximately 4-5 cm in its greatest diameter to the right of the midline surgical scar and superior to the umbilicus. No drainage is noted from this. She does have 2 draining sinuses below the umbilicus.  CT scan images personally reviewed  Assessment/Plan: Impression: Cellulitis with abscess, abdominal wall Plan: Will take patient to the operating room tomorrow for incision and drainage of abdominal wall abscess. The risks and benefits of the procedure were fully explained to the patient, who gave informed consent.  Aviva Signs 01/05/2017, 12:14 PM

## 2017-01-06 NOTE — Anesthesia Preprocedure Evaluation (Signed)
Anesthesia Evaluation  Patient identified by MRN, date of birth, ID band Patient awake    History of Anesthesia Complications (+) PONV  Airway Mallampati: I  TM Distance: >3 FB Neck ROM: Full    Dental  (+) Poor Dentition   Pulmonary Current Smoker,  Lung nodule   Pulmonary exam normal        Cardiovascular hypertension, Pt. on medications  Rhythm:Regular Rate:Normal     Neuro/Psych Anxiety Depression Bipolar Disorder    GI/Hepatic hiatal hernia, GERD  Controlled,  Endo/Other    Renal/GU      Musculoskeletal   Abdominal Normal abdominal exam  (+)   Peds  Hematology  (+) Sickle cell trait ,   Anesthesia Other Findings   Reproductive/Obstetrics                             Anesthesia Physical Anesthesia Plan  ASA: IV  Anesthesia Plan: General   Post-op Pain Management:    Induction: Intravenous  PONV Risk Score and Plan:   Airway Management Planned: LMA  Additional Equipment:   Intra-op Plan:   Post-operative Plan: Extubation in OR  Informed Consent: I have reviewed the patients History and Physical, chart, labs and discussed the procedure including the risks, benefits and alternatives for the proposed anesthesia with the patient or authorized representative who has indicated his/her understanding and acceptance.   Dental advisory given  Plan Discussed with: CRNA  Anesthesia Plan Comments:         Anesthesia Quick Evaluation

## 2017-01-06 NOTE — Op Note (Signed)
Patient:  Angel Price  DOB:  09-21-1965  MRN:  [id number]   Preop Diagnosis:  Abdominal abscess  Postop Diagnosis:  same  Procedure:  Incision and drainage of abdominal wall abscess  Surgeon:  Aviva Signs, M.D.  Anes:  Gen.  Indications:  Patient is a 51 year old black female with multiple medical problems who has had multiple abdominal surgeries and abscesses of the abdominal wall in the past who now presents with an abscess along the right aspect of the abdominal wall in close proximity to a midline incision. Multiple surgical scars are present. The risks and benefits of the procedure including bleeding, infection, and recurrence of the abscess were fully explained to the patient, who gave informed consent.  Procedure note:  The patient was placed in the supine position. After general endotracheal anesthesia was administered, the abdomen was prepped and draped using usual sterile technique with Betadine. Surgical site confirmation was performed.  The patient had 2 draining sinuses on either side of the midline inferior to the umbilicus. These have been present for some time and were not in direct continuity with the subcutaneous aspect superior to the umbilicus into the right of the midline. A longitudinal incision was made over this region. 2 small subcutaneous pockets of milky white fluidwere found. Aerobic and anaerobic cultures were taken and sent to microbiology.  Along the inferior aspect of the incision, multiple hard granulomatous adipose tissue were excised. In addition, there was a defect in the rectus muscle and a small amount of old mesh was found. This was excised. Both wounds were irrigated with normal saline. The wounds did not extend into the peritoneal cavity. 0.5% Sensorcaine was instilled into the surrounding wound. Iodoform Nu Gauze was packed into the subcutaneous tissue. The overlying skin was closed loosely over this using staples. Dressed with dressings  applied.  All tape and needle counts were correct at the end the procedure. The patient was awakened and transferred to PACU in stable condition.  Complications:  none  EBL:  minimal  Specimen:  Aerobic and anaerobic cultures

## 2017-01-07 ENCOUNTER — Encounter (HOSPITAL_COMMUNITY): Payer: Self-pay | Admitting: General Surgery

## 2017-01-07 LAB — CULTURE, BLOOD (ROUTINE X 2)
CULTURE: NO GROWTH
CULTURE: NO GROWTH
SPECIAL REQUESTS: ADEQUATE
SPECIAL REQUESTS: ADEQUATE

## 2017-01-07 MED ORDER — OXYCODONE-ACETAMINOPHEN 7.5-325 MG PO TABS: 1.0000 | ORAL_TABLET | ORAL | 0 refills | Status: DC | PRN

## 2017-01-07 MED ORDER — HYDROCODONE-ACETAMINOPHEN 5-325 MG PO TABS: 1.0000 | ORAL_TABLET | ORAL | 0 refills | Status: DC | PRN

## 2017-01-07 NOTE — Progress Notes (Signed)
Discharge instructions read to patient.  Patient verbalized understanding of all instructions. Discharged to home with family. 

## 2017-01-07 NOTE — Care Management Important Message (Signed)
Important Message  Patient Details  Name: ADIEL MCNAMARA MRN: [id number] Date of Birth: Mar 25, 1965   Medicare Important Message Given:  Yes    Kalanie Fewell, Chauncey Reading, RN 01/07/2017, 10:02 AM

## 2017-01-07 NOTE — Discharge Summary (Signed)
Physician Discharge Summary  LITTIE CHIEM KGU:[id number] DOB: 49/01/1966 DOA: 01/04/2017  PCP: Rosita Fire, MD  Admit date: 01/04/2017 Discharge date: 01/07/2017  Admitted From: Home  Disposition:   Home   Recommendations for Outpatient Follow-up:  1. Follow up with PCP in 1-2 weeks 2. Follow up with Dr. Arnoldo Morale next week 3. Take doxycycline as previously prescribed   Home Health: No Equipment/Devices: None  Discharge Condition: Stable  CODE STATUS: Full Code  Diet recommendation: Regular  Brief/Interim Summary: Katya Rolston Watkinsis a 51 y.o.femalewith a history of essential hypertension, anxiety, history of gastrectomy with Roux-en-Y anastomosis, history of Billroth II surgeries, history of abdominal wall abscesses, GERD, gastroparesis. Patient seen 2 days ago with abdominal pain and drainage of abdominal wall wound that started approximately a week ago. She was admitted at [facility] and based on vancomycin and Zosyn. Patient was discharged on Doxy yesterday. Since discharge, the patient continued to take her antibiotics, however her wound worsened and she has had increased purulence and increased erythema. She denies fevers, chills. She has baseline chronic nausea and intermittent diarrhea, which are not worsened. Denies chest pain or shortness of breath.  Emergency Department Course: Received dose of IV vancomycin. CT scan shows worsening abdominal wall cellulitis involving the subcutaneous fat in the right paramedian ventral abdomen which is increased in size from 10/18 with concerns of early developing abscess.  Patient was seen by general surgery and underwent incision and drainage on 10/22.  Cultures were sent from the operating room.  She will be continued on doxycycline.  She is to follow up with Dr. Arnoldo Morale within a week.  Discharge Diagnoses:  Principal Problem:   Abdominal wall abscess Active Problems:   History of gastric bypass   Essential  hypertension, benign   Gastroparesis   History of Billroth II operation    Discharge Instructions  Discharge Instructions    Call MD for:  difficulty breathing, headache or visual disturbances    Complete by:  As directed    Call MD for:  extreme fatigue    Complete by:  As directed    Call MD for:  hives    Complete by:  As directed    Call MD for:  persistant dizziness or light-headedness    Complete by:  As directed    Call MD for:  persistant nausea and vomiting    Complete by:  As directed    Call MD for:  redness, tenderness, or signs of infection (pain, swelling, redness, odor or green/yellow discharge around incision site)    Complete by:  As directed    Call MD for:  severe uncontrolled pain    Complete by:  As directed    Call MD for:  temperature >100.4    Complete by:  As directed    Diet general    Complete by:  As directed    Increase activity slowly    Complete by:  As directed      Allergies as of 01/07/2017      Reactions   Codeine Hives   Morphine And Related Hives   Reglan [metoclopramide] Other (See Comments)   EPS symptoms       Medication List    TAKE these medications   amLODipine 10 MG tablet Commonly known as:  NORVASC Take 0.5 tablets (5 mg total) by mouth daily.   aspirin 81 MG EC tablet Take 2 tablets (162 mg total) by mouth daily.   B-12 2500 MCG Tabs Take  5,000 mcg by mouth daily.   doxycycline 50 MG capsule Commonly known as:  VIBRAMYCIN Take 1 capsule (50 mg total) by mouth 2 (two) times daily.   HYDROcodone-acetaminophen 5-325 MG tablet Commonly known as:  NORCO Take 1 tablet by mouth every 4 (four) hours as needed for moderate pain.   lisinopril 20 MG tablet Commonly known as:  PRINIVIL,ZESTRIL Take 1 tablet (20 mg total) by mouth daily.   oxyCODONE-acetaminophen 7.5-325 MG tablet Commonly known as:  PERCOCET Take 1 tablet by mouth every 4 (four) hours as needed for severe pain.   pantoprazole 20 MG tablet Commonly  known as:  PROTONIX Take 1 tablet (20 mg total) by mouth daily.   zolpidem 5 MG tablet Commonly known as:  AMBIEN Take 5 mg by mouth at bedtime as needed for sleep.      Follow-up Information    Aviva Signs, MD. Schedule an appointment as soon as possible for a visit on 01/14/2017.   Specialty:  General Surgery Contact information: [address] [place] [address] [phone]          Allergies  Allergen Reactions  . Codeine Hives  . Morphine And Related Hives  . Reglan [Metoclopramide] Other (See Comments)    EPS symptoms     Consultations:  General Surgery   Procedures/Studies: Ct Angio Head W Or Wo Contrast  Result Date: 12/20/2016 CLINICAL DATA:  Left-sided weakness.  Follow-up stroke. EXAM: CT ANGIOGRAPHY HEAD AND NECK TECHNIQUE: Multidetector CT imaging of the head and neck was performed using the standard protocol during bolus administration of intravenous contrast. Multiplanar CT image reconstructions and MIPs were obtained to evaluate the vascular anatomy. Carotid stenosis measurements (when applicable) are obtained utilizing NASCET criteria, using the distal internal carotid diameter as the denominator. CONTRAST:  75 cc Isovue 370 COMPARISON:  MRI 12/04/2016 FINDINGS: CT HEAD FINDINGS Brain: No evidence of malformation, atrophy, old or acute small or large vessel infarction, mass lesion, hemorrhage, hydrocephalus or extra-axial collection. No evidence of pituitary lesion. Vascular: No vascular calcification.  No hyperdense vessels. Skull: Normal.  No fracture or focal bone lesion. Sinuses/Orbits: Visualized sinuses are clear. No fluid in the middle ears or mastoids. Visualized orbits are normal. Other: None significant CTA NECK FINDINGS Aortic arch: Minimal atherosclerotic calcification at the arch. No aneurysm or dissection. Branching pattern of the brachiocephalic vessels is normal without stenosis. Right carotid system: Common carotid artery widely  patent to the bifurcation. Carotid bifurcation is normal without atherosclerotic plaque or irregularity. Cervical internal carotid artery widely patent. Left carotid system: Common carotid artery widely patent to the bifurcation. Carotid bifurcation is normal without atherosclerotic plaque or irregularity. Cervical internal carotid artery is widely patent. Vertebral arteries: Both vertebral artery origins are widely patent. The vertebral arteries are approximately equal in size an widely patent through the cervical region. Skeleton: Spondylosis C5-6. Other neck: No mass or lymphadenopathy. Upper chest: No abnormality seen on the left. On the right, there is volume loss in the upper lobe, similar to the chest radiograph of 10/21/2016. Follow-up to clearing suggested. Review of the MIP images confirms the above findings CTA HEAD FINDINGS Anterior circulation: Both internal carotid arteries are patent through the skullbase and siphon regions. The anterior and middle cerebral vessels are patent without proximal stenosis, aneurysm or vascular malformation. Posterior circulation: Both vertebral arteries are widely patent to the basilar. No basilar stenosis. Posterior circulation branch vessels are normal. Venous sinuses: Patent and normal. Anatomic variants: None Delayed phase: No abnormal enhancement Review of  the MIP images confirms the above findings IMPRESSION: Minimal atherosclerosis of the aortic arch. Normal appearance of the brachiocephalic and intracranial vessels. No atherosclerotic plaque or regularity. Normal appearance of the brain. Focal density in the right upper lobe presumed to represent persistent atelectasis. Follow-up to clearing suggested subsequently. Electronically Signed   By: Nelson Chimes M.D.   On: 12/20/2016 12:35   Dg Chest 2 View  Result Date: 01/04/2017 CLINICAL DATA:  Anterior abdominal draining wound. EXAM: CHEST  2 VIEW COMPARISON:  01/02/2017 and prior radiograph FINDINGS:  Cardiomediastinal silhouette is unchanged. Mild pulmonary vascular congestion noted. Subsegmental atelectasis within the mid right lung noted. Mild bibasilar opacities may represent airspace disease/ pneumonia versus atelectasis. No pleural effusion or pneumothorax. IMPRESSION: Mild bibasilar opacities -question airspace disease/pneumonia versus atelectasis. Mid right lung subsegmental atelectasis. Electronically Signed   By: Margarette Canada M.D.   On: 01/04/2017 14:05   Dg Chest 2 View  Result Date: 01/02/2017 CLINICAL DATA:  Shortness of breath, weakness, and chest pain. Painful knot in the abdomen. Current smoker. EXAM: CHEST  2 VIEW COMPARISON:  Chest x-ray of September 23, 2016 FINDINGS: The lungs are adequately inflated and clear. The heart and pulmonary vascularity are normal. The mediastinum is normal in width. There is no pleural effusion. The bony thorax exhibits no acute abnormality. IMPRESSION: There is no acute cardiopulmonary abnormality. Electronically Signed   By: David  Martinique M.D.   On: 01/02/2017 15:09   Dg Thoracic Spine 2 View  Result Date: 12/22/2016 CLINICAL DATA:  Seizure.  Back pain. EXAM: THORACIC SPINE 2 VIEWS COMPARISON:  10/21/2016 FINDINGS: normal alignment. No fracture. No degenerative change or focal lesion. IMPRESSION: Negative. Electronically Signed   By: Nelson Chimes M.D.   On: 12/22/2016 08:10   Dg Lumbar Spine Complete  Result Date: 12/22/2016 CLINICAL DATA:  Seizure.  Back pain. EXAM: LUMBAR SPINE - COMPLETE 4+ VIEW COMPARISON:  CT 11/25/2016 FINDINGS: There is no evidence of lumbar spine fracture. Alignment is normal. Intervertebral disc spaces are maintained. IMPRESSION: Normal.  No traumatic finding. Electronically Signed   By: Nelson Chimes M.D.   On: 12/22/2016 08:11   Ct Head Wo Contrast  Result Date: 12/22/2016 CLINICAL DATA:  Seizure, altered mental status EXAM: CT HEAD WITHOUT CONTRAST TECHNIQUE: Contiguous axial images were obtained from the base of the skull  through the vertex without intravenous contrast. COMPARISON:  Brain MRI 12/20/2016 FINDINGS: Brain: No mass lesion, intraparenchymal hemorrhage or extra-axial collection. No evidence of acute cortical infarct. Brain parenchyma and CSF-containing spaces are normal for age. Vascular: No hyperdense vessel or unexpected calcification. Skull: Normal visualized skull base, calvarium and extracranial soft tissues. Sinuses/Orbits: No sinus fluid levels or advanced mucosal thickening. No mastoid effusion. Normal orbits. IMPRESSION: Normal head CT. Electronically Signed   By: Ulyses Jarred M.D.   On: 12/22/2016 01:13   Ct Angio Neck W Or Wo Contrast  Result Date: 12/20/2016 CLINICAL DATA:  Left-sided weakness.  Follow-up stroke. EXAM: CT ANGIOGRAPHY HEAD AND NECK TECHNIQUE: Multidetector CT imaging of the head and neck was performed using the standard protocol during bolus administration of intravenous contrast. Multiplanar CT image reconstructions and MIPs were obtained to evaluate the vascular anatomy. Carotid stenosis measurements (when applicable) are obtained utilizing NASCET criteria, using the distal internal carotid diameter as the denominator. CONTRAST:  75 cc Isovue 370 COMPARISON:  MRI 12/04/2016 FINDINGS: CT HEAD FINDINGS Brain: No evidence of malformation, atrophy, old or acute small or large vessel infarction, mass lesion, hemorrhage, hydrocephalus or extra-axial collection.  No evidence of pituitary lesion. Vascular: No vascular calcification.  No hyperdense vessels. Skull: Normal.  No fracture or focal bone lesion. Sinuses/Orbits: Visualized sinuses are clear. No fluid in the middle ears or mastoids. Visualized orbits are normal. Other: None significant CTA NECK FINDINGS Aortic arch: Minimal atherosclerotic calcification at the arch. No aneurysm or dissection. Branching pattern of the brachiocephalic vessels is normal without stenosis. Right carotid system: Common carotid artery widely patent to the  bifurcation. Carotid bifurcation is normal without atherosclerotic plaque or irregularity. Cervical internal carotid artery widely patent. Left carotid system: Common carotid artery widely patent to the bifurcation. Carotid bifurcation is normal without atherosclerotic plaque or irregularity. Cervical internal carotid artery is widely patent. Vertebral arteries: Both vertebral artery origins are widely patent. The vertebral arteries are approximately equal in size an widely patent through the cervical region. Skeleton: Spondylosis C5-6. Other neck: No mass or lymphadenopathy. Upper chest: No abnormality seen on the left. On the right, there is volume loss in the upper lobe, similar to the chest radiograph of 10/21/2016. Follow-up to clearing suggested. Review of the MIP images confirms the above findings CTA HEAD FINDINGS Anterior circulation: Both internal carotid arteries are patent through the skullbase and siphon regions. The anterior and middle cerebral vessels are patent without proximal stenosis, aneurysm or vascular malformation. Posterior circulation: Both vertebral arteries are widely patent to the basilar. No basilar stenosis. Posterior circulation branch vessels are normal. Venous sinuses: Patent and normal. Anatomic variants: None Delayed phase: No abnormal enhancement Review of the MIP images confirms the above findings IMPRESSION: Minimal atherosclerosis of the aortic arch. Normal appearance of the brachiocephalic and intracranial vessels. No atherosclerotic plaque or regularity. Normal appearance of the brain. Focal density in the right upper lobe presumed to represent persistent atelectasis. Follow-up to clearing suggested subsequently. Electronically Signed   By: Nelson Chimes M.D.   On: 12/20/2016 12:35   Mr Brain Wo Contrast  Result Date: 12/20/2016 CLINICAL DATA:  Left lower extremity weakness for 2 months EXAM: MRI HEAD WITHOUT CONTRAST TECHNIQUE: Multiplanar, multiecho pulse sequences of  the brain and surrounding structures were obtained without intravenous contrast. COMPARISON:  12/03/2016 FINDINGS: Brain: No acute infarction, hemorrhage, hydrocephalus, extra-axial collection or mass lesion. Partially empty sella, chronic and incidental in this setting. Vascular: Major flow voids are preserved. Skull and upper cervical spine: Negative for marrow lesion. Sinuses/Orbits: Negative IMPRESSION: Stable and negative brain MRI. Electronically Signed   By: Monte Fantasia M.D.   On: 12/20/2016 14:10   Ct Abdomen Pelvis W Contrast  Result Date: 01/04/2017 CLINICAL DATA:  Initial evaluation for acute abdominal infection. EXAM: CT ABDOMEN AND PELVIS WITH CONTRAST TECHNIQUE: Multidetector CT imaging of the abdomen and pelvis was performed using the standard protocol following bolus administration of intravenous contrast. CONTRAST:  175mL ISOVUE-300 IOPAMIDOL (ISOVUE-300) INJECTION 61% COMPARISON:  Prior CT from 01/02/2017. FINDINGS: Lower chest: 6 mm nodule partially visualize within the right middle lobe (series 8, image 1). Scattered atelectatic changes present within the visualized lung bases. Visualized lungs are otherwise clear. Hepatobiliary: Liver demonstrates a normal contrast enhanced appearance. Gallbladder surgically absent. Intra and extrahepatic biliary dilatation like related to post cholecystectomy changes. Pancreas: Pancreas somewhat atrophic in appearance but within normal limits. Spleen: Spleen within normal limits. Adrenals/Urinary Tract: 2 cm left adrenal nodule again noted, stable from previous. Right adrenal gland is surgically absent. Kidneys equal in size with symmetric enhancement. No nephrolithiasis, hydronephrosis, or focal enhancing renal mass. No hydroureter. Bladder largely decompressed without acute abnormality. Stomach/Bowel: Postsurgical changes  noted about the GE junction and stomach. Stomach otherwise unremarkable. No evidence for bowel obstruction. Several loops of bowel  appear intact CT and ear abdominal wall, like related underlying adhesive disease. No acute inflammatory changes seen about the bowels. Vascular/Lymphatic: Normal intravascular enhancement seen throughout the intra-abdominal aorta and and its branch vessels. Scattered aorto bi-iliac atherosclerotic disease. No aneurysm. No enlarged intra-abdominopelvic lymph nodes. Reproductive: Uterus is absent.  Ovaries not discretely identified. Other: No free air or fluid. Musculoskeletal: Healing left superior and inferior pubic rami fractures again noted. No other acute osseous abnormality. No other worrisome lytic or blastic osseous lesions. Abnormal swelling with inflammatory stranding involving the subcutaneous fat of the right paramedian ventral abdominal wall again seen, worsened in appearance and increased in size relative to recent CT. Area of involvement now measures approximately 3.8 x 2.1 cm (series 7, image 50), previously 3.2 x 2.3 cm). Central hypodensity within this area suspicious for possible early abscess (series 7, image 50). Process largely involves the subcutaneous fat, although there is extension into the underlying rectus musculature. No extension into the underlying peritoneal in at this time. Additional postsurgical scarring along the midline and within the left ventral abdominal wall again noted, stable. IMPRESSION: 1. Interval worsening and inflammatory process involving the subcutaneous fat of the right paramedian ventral abdomen, increased in size as compared to recent CT from 01/02/2017. Developing central hypodensity within this area suspicious for early/developing abscess. No extension into the underlying peritoneum at this time. 2. No other new acute intra-abdominal or pelvic process. Electronically Signed   By: Jeannine Boga M.D.   On: 01/04/2017 18:01   Ct Abdomen Pelvis W Contrast  Result Date: 01/02/2017 CLINICAL DATA:  Abdominal pain and fever. EXAM: CT ABDOMEN AND PELVIS WITH  CONTRAST TECHNIQUE: Multidetector CT imaging of the abdomen and pelvis was performed using the standard protocol following bolus administration of intravenous contrast. CONTRAST:  112mL ISOVUE-300 IOPAMIDOL (ISOVUE-300) INJECTION 61% COMPARISON:  CT abdomen pelvis dated November 25, 2016. FINDINGS: Lower chest: No acute abnormality. Hepatobiliary: No focal liver abnormality is seen. Status post cholecystectomy. Mild biliary dilatation is unchanged, and likely related to post cholecystectomy state. Pancreas: Atrophy. No pancreatic ductal dilatation or surrounding inflammatory changes. Spleen: Normal in size without focal abnormality. Adrenals/Urinary Tract: The right adrenal gland is surgically absent. Unchanged left adrenal adenoma. The kidneys are unremarkable. No hydronephrosis or renal calculi. Mild fullness of the left distal ureter. The bladder is unremarkable. 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 No evidence of bowel wall thickening, distention, or surrounding inflammatory changes. Vascular/Lymphatic: Aortic atherosclerosis. No enlarged abdominal or pelvic lymph nodes. Reproductive: Status post hysterectomy. No adnexal masses. Other: Scarring of the anterior abdominal wall is again noted, with increased somewhat nodular inflammatory stranding in the right paramidline mid abdominal wall. No free fluid or pneumoperitoneum. Musculoskeletal: Unchanged healing left superior and inferior pubic rami fractures. No new fracture. IMPRESSION: 1. Increased somewhat nodular inflammatory stranding in the right paramidline mid abdominal wall at the site of prior surgery, which could reflect cellulitis. No definite drainable fluid collection. 2. Mild fullness of the left distal ureter without evidence of obstructing calculus. 3.  Aortic atherosclerosis (ICD10-I70.0). 4. Unchanged healing left superior and inferior pubic rami fractures. Electronically Signed   By: Titus Dubin M.D.   On:  01/02/2017 18:48      Subjective: Patient doing well.  She is dressed and ready to discharge.  No symptoms or questions noted.  Discharge Exam: Vitals:   01/07/17 0400 01/07/17  0837  BP: 133/83   Pulse: 73   Resp: 18   Temp: 97.7 F (36.5 C)   SpO2: 96% 96%   Vitals:   01/06/17 2039 01/07/17 0000 01/07/17 0400 01/07/17 0837  BP: 139/82 120/75 133/83   Pulse: 73 68 73   Resp: 18 18 18    Temp: 97.8 F (36.6 C) 98 F (36.7 C) 97.7 F (36.5 C)   TempSrc: Oral Oral Oral   SpO2: 98% 98% 96% 96%  Weight:      Height:        General: Pt is alert, awake, not in acute distress Cardiovascular: RRR, S1/S2 +, no rubs, no gallops Respiratory: CTA bilaterally, no wheezing, no rhonchi Abdominal: Soft, minimal tender on ABD for which patient has covering surgical site, ND, bowel sounds + Extremities: no edema, no cyanosis    The results of significant diagnostics from this hospitalization (including imaging, microbiology, ancillary and laboratory) are listed below for reference.     Microbiology: Recent Results (from the past 240 hour(s))  Blood Culture (routine x 2)     Status: None (Preliminary result)   Collection Time: 01/02/17  5:38 PM  Result Value Ref Range Status   Specimen Description BLOOD BLOOD LEFT FOREARM  Final   Special Requests   Final    BOTTLES DRAWN AEROBIC AND ANAEROBIC Blood Culture adequate volume   Culture NO GROWTH 4 DAYS  Final   Report Status PENDING  Incomplete  Blood Culture (routine x 2)     Status: None (Preliminary result)   Collection Time: 01/02/17  5:50 PM  Result Value Ref Range Status   Specimen Description BLOOD RIGHT WRIST  Final   Special Requests   Final    BOTTLES DRAWN AEROBIC AND ANAEROBIC Blood Culture adequate volume   Culture NO GROWTH 4 DAYS  Final   Report Status PENDING  Incomplete  MRSA PCR Screening     Status: None   Collection Time: 01/04/17  7:18 PM  Result Value Ref Range Status   MRSA by PCR NEGATIVE NEGATIVE  Final    Comment:        The GeneXpert MRSA Assay (FDA approved for NASAL specimens only), is one component of a comprehensive MRSA colonization surveillance program. It is not intended to diagnose MRSA infection nor to guide or monitor treatment for MRSA infections.   Wound or Superficial Culture     Status: None (Preliminary result)   Collection Time: 01/04/17  7:35 PM  Result Value Ref Range Status   Specimen Description ABDOMEN  Final   Special Requests NONE  Final   Gram Stain   Final    RARE WBC PRESENT, PREDOMINANTLY PMN NO ORGANISMS SEEN    Culture   Final    CULTURE REINCUBATED FOR BETTER GROWTH Performed at [facility], [place] [address], [place], [place] [address]    Report Status PENDING  Incomplete  Surgical PCR screen     Status: None   Collection Time: 01/05/17  4:11 PM  Result Value Ref Range Status   MRSA, PCR NEGATIVE NEGATIVE Final   Staphylococcus aureus NEGATIVE NEGATIVE Final    Comment: (NOTE) The Xpert SA Assay (FDA approved for NASAL specimens in patients 2 years of age and older), is one component of a comprehensive surveillance program. It is not intended to diagnose infection nor to guide or monitor treatment.   Aerobic Culture (superficial specimen)     Status: None (Preliminary result)   Collection Time: 01/06/17 11:10  AM  Result Value Ref Range Status   Specimen Description ABSCESS ABDOMEN  Final   Special Requests NONE  Final   Gram Stain   Final    ABUNDANT WBC PRESENT,BOTH PMN AND MONONUCLEAR NO ORGANISMS SEEN    Culture PENDING  Incomplete   Report Status PENDING  Incomplete     Labs: BNP (last 3 results) No results for input(s): BNP in the last 8760 hours. Basic Metabolic Panel:  Recent Labs Lab 01/02/17 1350 01/03/17 0354 01/04/17 1401 01/05/17 0624 01/06/17 0928  NA 135 139 144 139 139  K 2.7* 3.7 4.2 3.3* 3.6  CL 105 110 108 106 107  CO2 17* 22 28 26 25   GLUCOSE 228* 81 89 107* 98  BUN 7 7 7 7 10    CREATININE 0.48 0.48 0.41* 0.43* 0.34*  CALCIUM 8.8* 8.7* 9.8 8.9 9.0   Liver Function Tests:  Recent Labs Lab 01/02/17 1350 01/04/17 1401  AST 23 26  ALT 20 21  ALKPHOS 121 113  BILITOT 0.3 0.4  PROT 7.4 8.0  ALBUMIN 3.3* 3.6    Recent Labs Lab 01/02/17 1350  LIPASE 21   No results for input(s): AMMONIA in the last 168 hours. CBC:  Recent Labs Lab 01/02/17 1350 01/03/17 0354 01/04/17 1401 01/05/17 0624 01/06/17 0928  WBC 11.5* 8.1 10.0 7.3 6.5  NEUTROABS 6.2  --  6.0  --  3.2  HGB 10.9* 9.1* 10.6* 9.7* 9.8*  HCT 31.3* 26.9* 31.1* 27.9* 29.2*  MCV 75.1* 74.7* 75.7* 74.8* 75.6*  PLT 457* 412* 427* 423* 409*   Cardiac Enzymes: No results for input(s): CKTOTAL, CKMB, CKMBINDEX, TROPONINI in the last 168 hours. BNP: Invalid input(s): POCBNP CBG: No results for input(s): GLUCAP in the last 168 hours. D-Dimer No results for input(s): DDIMER in the last 72 hours. Hgb A1c No results for input(s): HGBA1C in the last 72 hours. Lipid Profile No results for input(s): CHOL, HDL, LDLCALC, TRIG, CHOLHDL, LDLDIRECT in the last 72 hours. Thyroid function studies No results for input(s): TSH, T4TOTAL, T3FREE, THYROIDAB in the last 72 hours.  Invalid input(s): FREET3 Anemia work up No results for input(s): VITAMINB12, FOLATE, FERRITIN, TIBC, IRON, RETICCTPCT in the last 72 hours. Urinalysis    Component Value Date/Time   COLORURINE STRAW (A) 01/04/2017 1859   APPEARANCEUR CLEAR 01/04/2017 1859   LABSPEC 1.042 (H) 01/04/2017 1859   PHURINE 6.0 01/04/2017 1859   GLUCOSEU NEGATIVE 01/04/2017 1859   HGBUR NEGATIVE 01/04/2017 1859   BILIRUBINUR NEGATIVE 01/04/2017 1859   KETONESUR NEGATIVE 01/04/2017 1859   PROTEINUR NEGATIVE 01/04/2017 1859   UROBILINOGEN 0.2 11/26/2014 0915   NITRITE NEGATIVE 01/04/2017 1859   LEUKOCYTESUR NEGATIVE 01/04/2017 1859   Sepsis Labs Invalid input(s): PROCALCITONIN,  WBC,  LACTICIDVEN Microbiology Recent Results (from the past 240  hour(s))  Blood Culture (routine x 2)     Status: None (Preliminary result)   Collection Time: 01/02/17  5:38 PM  Result Value Ref Range Status   Specimen Description BLOOD BLOOD LEFT FOREARM  Final   Special Requests   Final    BOTTLES DRAWN AEROBIC AND ANAEROBIC Blood Culture adequate volume   Culture NO GROWTH 4 DAYS  Final   Report Status PENDING  Incomplete  Blood Culture (routine x 2)     Status: None (Preliminary result)   Collection Time: 01/02/17  5:50 PM  Result Value Ref Range Status   Specimen Description BLOOD RIGHT WRIST  Final   Special Requests   Final  BOTTLES DRAWN AEROBIC AND ANAEROBIC Blood Culture adequate volume   Culture NO GROWTH 4 DAYS  Final   Report Status PENDING  Incomplete  MRSA PCR Screening     Status: None   Collection Time: 01/04/17  7:18 PM  Result Value Ref Range Status   MRSA by PCR NEGATIVE NEGATIVE Final    Comment:        The GeneXpert MRSA Assay (FDA approved for NASAL specimens only), is one component of a comprehensive MRSA colonization surveillance program. It is not intended to diagnose MRSA infection nor to guide or monitor treatment for MRSA infections.   Wound or Superficial Culture     Status: None (Preliminary result)   Collection Time: 01/04/17  7:35 PM  Result Value Ref Range Status   Specimen Description ABDOMEN  Final   Special Requests NONE  Final   Gram Stain   Final    RARE WBC PRESENT, PREDOMINANTLY PMN NO ORGANISMS SEEN    Culture   Final    CULTURE REINCUBATED FOR BETTER GROWTH Performed at [facility], [place] [address] [place], [place] [address]    Report Status PENDING  Incomplete  Surgical PCR screen     Status: None   Collection Time: 01/05/17  4:11 PM  Result Value Ref Range Status   MRSA, PCR NEGATIVE NEGATIVE Final   Staphylococcus aureus NEGATIVE NEGATIVE Final    Comment: (NOTE) The Xpert SA Assay (FDA approved for NASAL specimens in patients 50 years of age and older), is one component  of a comprehensive surveillance program. It is not intended to diagnose infection nor to guide or monitor treatment.   Aerobic Culture (superficial specimen)     Status: None (Preliminary result)   Collection Time: 01/06/17 11:10 AM  Result Value Ref Range Status   Specimen Description ABSCESS ABDOMEN  Final   Special Requests NONE  Final   Gram Stain   Final    ABUNDANT WBC PRESENT,BOTH PMN AND MONONUCLEAR NO ORGANISMS SEEN    Culture PENDING  Incomplete   Report Status PENDING  Incomplete     Time coordinating discharge: 20 minutes  SIGNED:   Loretha Stapler, MD  [facility] 01/07/2017, 10:05 AM Pager [phone] If 7PM-7AM, please contact night-coverage [email or web address] Password [email or web address]

## 2017-01-07 NOTE — Progress Notes (Signed)
1 Day Post-Op  Subjective: Patient doing well.  Objective: Vital signs in last 24 hours: Temp:  [97.7 F (36.5 C)-98.5 F (36.9 C)] 97.7 F (36.5 C) (10/23 0400) Pulse Rate:  [64-89] 73 (10/23 0400) Resp:  [14-20] 18 (10/23 0400) BP: (106-156)/(70-104) 133/83 (10/23 0400) SpO2:  [93 %-98 %] 96 % (10/23 0837) Last BM Date: 01/06/17  Intake/Output from previous day: 10/22 0701 - 10/23 0700 In: 1947.5 [P.O.:720; I.V.:927.5; IV Piggyback:300] Out: 2010 [Urine:2000; Blood:10] Intake/Output this shift: No intake/output data recorded.  General appearance: alert, cooperative and no distress GI: soft, incision healing well. Packing removed.  Lab Results:   Recent Labs  01/05/17 0624 01/06/17 0928  WBC 7.3 6.5  HGB 9.7* 9.8*  HCT 27.9* 29.2*  PLT 423* 409*   BMET  Recent Labs  01/05/17 0624 01/06/17 0928  NA 139 139  K 3.3* 3.6  CL 106 107  CO2 26 25  GLUCOSE 107* 98  BUN 7 10  CREATININE 0.43* 0.34*  CALCIUM 8.9 9.0   PT/INR No results for input(s): LABPROT, INR in the last 72 hours.  Studies/Results: No results found.  Anti-infectives: Anti-infectives    Start     Dose/Rate Route Frequency Ordered Stop   01/05/17 0600  vancomycin (VANCOCIN) IVPB 750 mg/150 ml premix     750 mg 150 mL/hr over 60 Minutes Intravenous Every 12 hours 01/04/17 2059     01/04/17 1845  vancomycin (VANCOCIN) IVPB 1000 mg/200 mL premix     1,000 mg 200 mL/hr over 60 Minutes Intravenous  Once 01/04/17 1838 01/04/17 1958   01/04/17 1630  doxycycline (VIBRAMYCIN) 100 mg in dextrose 5 % 250 mL IVPB     100 mg 125 mL/hr over 120 Minutes Intravenous  Once 01/04/17 1611 01/04/17 1958     Initial Gram stain of cultures negative for organisms. Assessment/Plan: s/p Procedure(s): INCISION AND DRAINAGE ABDOMINAL WALL ABSCESS impression: Stable on postoperative day 1. Patient may be discharged today. She already has doxycycline at home. Have written for Percocet for pain. Will follow-up  in my office next week.  LOS: 3 days    Aviva Signs 01/07/2017

## 2017-01-08 DIAGNOSIS — D573 Sickle-cell trait: Secondary | ICD-10-CM | POA: Diagnosis not present

## 2017-01-08 DIAGNOSIS — M328 Other forms of systemic lupus erythematosus: Secondary | ICD-10-CM | POA: Diagnosis not present

## 2017-01-08 DIAGNOSIS — I10 Essential (primary) hypertension: Secondary | ICD-10-CM | POA: Diagnosis not present

## 2017-01-08 DIAGNOSIS — E079 Disorder of thyroid, unspecified: Secondary | ICD-10-CM | POA: Diagnosis not present

## 2017-01-08 DIAGNOSIS — K3184 Gastroparesis: Secondary | ICD-10-CM | POA: Diagnosis not present

## 2017-01-08 DIAGNOSIS — K219 Gastro-esophageal reflux disease without esophagitis: Secondary | ICD-10-CM | POA: Diagnosis not present

## 2017-01-08 DIAGNOSIS — R69 Illness, unspecified: Secondary | ICD-10-CM | POA: Diagnosis not present

## 2017-01-08 DIAGNOSIS — M6281 Muscle weakness (generalized): Secondary | ICD-10-CM | POA: Diagnosis not present

## 2017-01-08 LAB — AEROBIC CULTURE W GRAM STAIN (SUPERFICIAL SPECIMEN)

## 2017-01-09 LAB — AEROBIC CULTURE  (SUPERFICIAL SPECIMEN)

## 2017-01-09 LAB — AEROBIC CULTURE W GRAM STAIN (SUPERFICIAL SPECIMEN)

## 2017-01-11 LAB — ANAEROBIC CULTURE

## 2017-01-14 ENCOUNTER — Ambulatory Visit: Payer: Medicare HMO | Attending: Neurology | Admitting: Neurology

## 2017-01-14 ENCOUNTER — Encounter: Payer: Self-pay | Admitting: General Surgery

## 2017-01-14 ENCOUNTER — Ambulatory Visit (INDEPENDENT_AMBULATORY_CARE_PROVIDER_SITE_OTHER): Payer: Self-pay | Admitting: General Surgery

## 2017-01-14 VITALS — BP 146/105 | HR 85 | Temp 97.8°F | Resp 18 | Ht 63.0 in | Wt 134.0 lb

## 2017-01-14 DIAGNOSIS — G4733 Obstructive sleep apnea (adult) (pediatric): Secondary | ICD-10-CM

## 2017-01-14 DIAGNOSIS — Z09 Encounter for follow-up examination after completed treatment for conditions other than malignant neoplasm: Secondary | ICD-10-CM

## 2017-01-14 NOTE — Progress Notes (Signed)
Subjective:     Angel Price  Status post incision and drainage of abdominal wall abscess.  Patient doing well.  Drainage has decreased.  No fevers have been noted.  She is taking the doxycycline. Objective:    BP (!) 146/105   Pulse 85   Temp 97.8 F (36.6 C)   Resp 18   Ht 5\' 3"  (1.6 m)   Wt 134 lb (60.8 kg)   BMI 23.74 kg/m   General:  alert, cooperative and no distress  Abdomen soft.  Incision healing well.  No significant erythema or induration noted. Culture report reviewed.     Assessment:    Doing well postoperatively.    Plan:   Continue current wound care.  Continue doxycycline.  Follow-up here in 2 weeks.

## 2017-01-18 NOTE — Procedures (Signed)
Patterson A. [name], MD     [email or web address]             NOCTURNAL POLYSOMNOGRAPHY   LOCATION: [facility]   Patient Name: Angel Price, Angel Price Date: 01/14/2017 Gender: Female D.O.B: 05/19/1965 Age (years): 76 Referring Provider: Phillips Odor MD, ABSM Height (inches): 63 Interpreting Physician: Phillips Odor MD, ABSM Weight (lbs): 134 RPSGT: Rosebud Poles BMI: 24 MRN: [id number] Neck Size: 14.50 CLINICAL INFORMATION Sleep Study Type: NPSG     Indication for sleep study: OSA     Epworth Sleepiness Score: 2     SLEEP STUDY TECHNIQUE As per the AASM Manual for the Scoring of Sleep and Associated Events v2.3 (April 2016) with a hypopnea requiring 4% desaturations.  The channels recorded and monitored were frontal, central and occipital EEG, electrooculogram (EOG), submentalis EMG (chin), nasal and oral airflow, thoracic and abdominal wall motion, anterior tibialis EMG, snore microphone, electrocardiogram, and pulse oximetry.  MEDICATIONS Medications self-administered by patient taken the night of the study : N/A  Current Outpatient Prescriptions:  .  amLODipine (NORVASC) 10 MG tablet, Take 0.5 tablets (5 mg total) by mouth daily., Disp: 30 tablet, Rfl: 0 .  aspirin EC 81 MG EC tablet, Take 2 tablets (162 mg total) by mouth daily., Disp: 60 tablet, Rfl: 2 .  Cyanocobalamin (VITAMIN B-12) 2500 MCG TABS, Take 5,000 mcg by mouth daily., Disp: 120 tablet, Rfl: 2 .  lisinopril (PRINIVIL,ZESTRIL) 20 MG tablet, Take 1 tablet (20 mg total) by mouth daily., Disp: 30 tablet, Rfl: 11 .  oxyCODONE-acetaminophen (PERCOCET) 7.5-325 MG tablet, Take 1 tablet by mouth every 4 (four) hours as needed for severe pain., Disp: 30 tablet, Rfl: 0 .  pantoprazole (PROTONIX) 20 MG tablet, Take 1 tablet (20 mg total) by mouth daily., Disp: 15 tablet, Rfl: 0 .  zolpidem (AMBIEN) 5 MG tablet, Take 5 mg by mouth at bedtime as needed for sleep., Disp: , Rfl:        SLEEP ARCHITECTURE The study was initiated at 10:48:48 PM and ended at 5:58:27 AM.  Sleep onset time was 19.3 minutes and the sleep efficiency was 82.6%. The total sleep time was 354.9 minutes.  Stage REM latency was 140.0 minutes.  The patient spent 4.23% of the night in stage N1 sleep, 60.41% in stage N2 sleep, 18.60% in stage N3 and 16.77% in REM.  Alpha intrusion was absent.  Supine sleep was 45.65%.  RESPIRATORY PARAMETERS The overall apnea/hypopnea index (AHI) was 2.9 per hour. There were 0 total apneas, including 0 obstructive, 0 central and 0 mixed apneas. There were 17 hypopneas and 4 RERAs.  The AHI during Stage REM sleep was 15.1 per hour.  AHI while supine was 6.3 per hour.  The mean oxygen saturation was 96.60%. The minimum SpO2 during sleep was 87.00%.  moderate snoring was noted during this study.  CARDIAC DATA The 2 lead EKG demonstrated sinus rhythm. The mean heart rate was N/A beats per minute. Other EKG findings include: None. LEG MOVEMENT DATA The total PLMS were 55 with a resulting PLMS index of 9.30. Associated arousal with leg movement index was 3.0.  IMPRESSIONS No significant obstructive sleep apnea occurred during this study. No significant central sleep apnea occurred during this study. Mild periodic limb movements of sleep occurred during the study. No significant associated arousals.  Delano Metz, MD Diplomate, American Board of Sleep Medicine. ELECTRONICALLY SIGNED ON:  01/18/2017, 7:32 PM [place]: [phone]   FX: [phone]  [place] [facility]

## 2017-01-21 DIAGNOSIS — M6281 Muscle weakness (generalized): Secondary | ICD-10-CM | POA: Diagnosis not present

## 2017-01-21 DIAGNOSIS — E079 Disorder of thyroid, unspecified: Secondary | ICD-10-CM | POA: Diagnosis not present

## 2017-01-21 DIAGNOSIS — K219 Gastro-esophageal reflux disease without esophagitis: Secondary | ICD-10-CM | POA: Diagnosis not present

## 2017-01-21 DIAGNOSIS — R69 Illness, unspecified: Secondary | ICD-10-CM | POA: Diagnosis not present

## 2017-01-21 DIAGNOSIS — D573 Sickle-cell trait: Secondary | ICD-10-CM | POA: Diagnosis not present

## 2017-01-21 DIAGNOSIS — I10 Essential (primary) hypertension: Secondary | ICD-10-CM | POA: Diagnosis not present

## 2017-01-21 DIAGNOSIS — M6210 Other rupture of muscle (nontraumatic), unspecified site: Secondary | ICD-10-CM | POA: Diagnosis not present

## 2017-01-21 DIAGNOSIS — M328 Other forms of systemic lupus erythematosus: Secondary | ICD-10-CM | POA: Diagnosis not present

## 2017-01-21 DIAGNOSIS — K3184 Gastroparesis: Secondary | ICD-10-CM | POA: Diagnosis not present

## 2017-01-22 ENCOUNTER — Ambulatory Visit: Payer: Medicare HMO | Admitting: Neurology

## 2017-01-22 ENCOUNTER — Telehealth: Payer: Self-pay

## 2017-01-22 NOTE — Telephone Encounter (Signed)
Patient was a no show for her NP appt with Dr. Krista Blue.

## 2017-01-23 ENCOUNTER — Encounter: Payer: Self-pay | Admitting: Neurology

## 2017-01-25 ENCOUNTER — Emergency Department (HOSPITAL_COMMUNITY)
Admission: EM | Admit: 2017-01-25 | Discharge: 2017-01-25 | Disposition: A | Discharge status: Home / Self Care | Payer: Medicare HMO | Attending: Emergency Medicine | Admitting: Emergency Medicine

## 2017-01-25 ENCOUNTER — Encounter (HOSPITAL_COMMUNITY): Payer: Self-pay

## 2017-01-25 ENCOUNTER — Emergency Department (HOSPITAL_COMMUNITY): Payer: Medicare HMO

## 2017-01-25 DIAGNOSIS — R69 Illness, unspecified: Secondary | ICD-10-CM | POA: Diagnosis not present

## 2017-01-25 DIAGNOSIS — R1013 Epigastric pain: Secondary | ICD-10-CM

## 2017-01-25 DIAGNOSIS — F17210 Nicotine dependence, cigarettes, uncomplicated: Secondary | ICD-10-CM | POA: Insufficient documentation

## 2017-01-25 DIAGNOSIS — I10 Essential (primary) hypertension: Secondary | ICD-10-CM | POA: Insufficient documentation

## 2017-01-25 DIAGNOSIS — R112 Nausea with vomiting, unspecified: Secondary | ICD-10-CM | POA: Diagnosis not present

## 2017-01-25 DIAGNOSIS — R109 Unspecified abdominal pain: Secondary | ICD-10-CM | POA: Diagnosis not present

## 2017-01-25 LAB — CBC WITH DIFFERENTIAL/PLATELET
BASOS ABS: 0 10*3/uL (ref 0.0–0.1)
Basophils Relative: 0 %
EOS PCT: 0 %
Eosinophils Absolute: 0 10*3/uL (ref 0.0–0.7)
HEMATOCRIT: 37.3 % (ref 36.0–46.0)
Hemoglobin: 12.7 g/dL (ref 12.0–15.0)
LYMPHS ABS: 1.7 10*3/uL (ref 0.7–4.0)
LYMPHS PCT: 17 %
MCH: 25.3 pg — AB (ref 26.0–34.0)
MCHC: 34 g/dL (ref 30.0–36.0)
MCV: 74.3 fL — AB (ref 78.0–100.0)
MONO ABS: 1 10*3/uL (ref 0.1–1.0)
MONOS PCT: 9 %
NEUTROS ABS: 7.6 10*3/uL (ref 1.7–7.7)
Neutrophils Relative %: 74 %
PLATELETS: 365 10*3/uL (ref 150–400)
RBC: 5.02 MIL/uL (ref 3.87–5.11)
RDW: 18 % — AB (ref 11.5–15.5)
WBC: 10.3 10*3/uL (ref 4.0–10.5)

## 2017-01-25 LAB — COMPREHENSIVE METABOLIC PANEL
ALT: 18 U/L (ref 14–54)
AST: 25 U/L (ref 15–41)
Albumin: 4.7 g/dL (ref 3.5–5.0)
Alkaline Phosphatase: 165 U/L — ABNORMAL HIGH (ref 38–126)
Anion gap: 22 — ABNORMAL HIGH (ref 5–15)
BILIRUBIN TOTAL: 2 mg/dL — AB (ref 0.3–1.2)
BUN: 11 mg/dL (ref 6–20)
CO2: 14 mmol/L — ABNORMAL LOW (ref 22–32)
CREATININE: 0.96 mg/dL (ref 0.44–1.00)
Calcium: 10.1 mg/dL (ref 8.9–10.3)
Chloride: 95 mmol/L — ABNORMAL LOW (ref 101–111)
Glucose, Bld: 125 mg/dL — ABNORMAL HIGH (ref 65–99)
POTASSIUM: 3.3 mmol/L — AB (ref 3.5–5.1)
Sodium: 131 mmol/L — ABNORMAL LOW (ref 135–145)
TOTAL PROTEIN: 9.6 g/dL — AB (ref 6.5–8.1)

## 2017-01-25 LAB — LIPASE, BLOOD: Lipase: 20 U/L (ref 11–51)

## 2017-01-25 MED ORDER — ONDANSETRON HCL 4 MG/2ML IJ SOLN
4.0000 mg | Freq: Once | INTRAMUSCULAR | Status: AC
  Administered 2017-01-25: 4 mg via INTRAVENOUS
  Filled 2017-01-25: qty 2

## 2017-01-25 MED ORDER — ONDANSETRON 4 MG PO TBDP: 4.0000 mg | ORAL_TABLET | Freq: Three times a day (TID) | ORAL | 0 refills | Status: DC | PRN

## 2017-01-25 MED ORDER — OXYCODONE-ACETAMINOPHEN 5-325 MG PO TABS: 1.0000 | ORAL_TABLET | Freq: Four times a day (QID) | ORAL | 0 refills | Status: DC | PRN

## 2017-01-25 MED ORDER — HYDROMORPHONE HCL 1 MG/ML IJ SOLN
1.0000 mg | Freq: Once | INTRAMUSCULAR | Status: AC
  Administered 2017-01-25: 1 mg via INTRAVENOUS
  Filled 2017-01-25: qty 1

## 2017-01-25 MED ORDER — SODIUM CHLORIDE 0.9 % IV BOLUS (SEPSIS)
1000.0000 mL | Freq: Once | INTRAVENOUS | Status: AC
  Administered 2017-01-25: 1000 mL via INTRAVENOUS

## 2017-01-25 NOTE — ED Provider Notes (Signed)
Emergency Department Provider Note   I have reviewed the triage vital signs and the nursing notes.   HISTORY  Chief Complaint Abdominal Pain   HPI Angel Price is a 51 y.o. female with PMH of pancreatitis, gastroparesis, HTN, hiatal hernia, and recent surgery for I&D of 2 superficial abdominal wall abscesses severe epigastric abdominal pain with associated nausea and vomiting.  Symptoms began 4 days ago.  Patient states that time she drank one beer and had symptoms afterwards.  She reports that this feels similar to her pancreatitis pain.  She continued to have vomiting and diarrhea 2 days.  Her diarrhea stopped at that time but vomiting and abdominal pain continues through to today.  She denies any fevers but has noticed some chills at times.  She states that her abdominal wound seems to be healing well and denies any significant drainage or bleeding.  She has no lower abdominal pain in the area of that wound.  No chest pain or difficulty breathing.   Past Medical History:  Diagnosis Date  . Abscess    soft tissue  . Adrenal mass ([place])   . Alcohol abuse   . Anxiety   . Blood transfusion without reported diagnosis   . Chronic abdominal pain   . Chronic wound infection of abdomen   . Colon polyp    colonoscopy 04/2014  . Depression   . Diverticulosis    colonoscopy 04/2014 moderat pan colonic  . Gastritis    EGD 05/2014  . Gastroparesis Nov 2015  . GERD (gastroesophageal reflux disease)   . Hemorrhoid    internal large  . Hiatal hernia   . History of Billroth II operation   . Hypertension   . Lung nodule    CT 02/2014 needs repeat 1 month  . Lung nodule < 6cm on CT 04/25/2014  . Lupus   . Nausea and vomiting    chronic, recurrent  . Pancreatitis   . Schatzki's ring    patent per EGD 04/2014  . Sickle cell trait ([place])   . Suicide attempt ([place])   . 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 Cellulitis  of abdominal wall 01/02/2017  . Acute left hemiparesis ([place]) 12/05/2016  . Malingering 12/05/2016  . Weakness of left lower extremity 12/02/2016  . CVA (cerebral vascular accident) ([place]) 11/23/2016  . Bright red rectal bleeding 08/22/2016  . Hypotension due to blood loss   . Acute GI bleeding 05/24/2016  . Dieulafoy lesion of duodenum   . History of Billroth II operation   . Gastrointestinal hemorrhage 05/22/2016  . Absolute anemia   . Acute pancreatitis 10/01/2015  . Abnormal CT scan of lung 10/01/2015  . Alcohol intoxication ([place])   . Left-sided weakness   . Psychosomatic factor in physical condition   . Upper GI bleed   . Diverticulosis of colon with hemorrhage   . Chronic wound infection of abdomen 12/22/2014  . Thyroid disease 12/22/2014  . Ataxia 11/01/2014  . Hemorrhoids 04/26/2014  . Lung nodule 04/26/2014  . Diverticulosis   . Gastritis   . Hiatal hernia   . Schatzki's ring   . Acute blood loss anemia 04/25/2014  . Sinus tachycardia 04/25/2014  . Hematemesis with nausea 04/25/2014  . Lung nodule < 6cm on CT 04/25/2014  . Intractable nausea and vomiting 04/24/2014  . Gastroparesis   . Abdominal pain 04/01/2014  . Chronic abdominal pain 01/21/2014  .  Gastroenteritis 12/10/2013  . Chronic abdominal wound infection 08/16/2013  . MDD (major depressive disorder), recurrent episode, severe ([place]) 06/27/2013  . Wrist laceration 06/24/2013  . Diarrhea 05/07/2013  . Rectal bleeding 05/07/2013  . Abnormal LFTs 05/07/2013  . Adrenal mass, left ([place]) 05/07/2013  . Abdominal wall abscess at site of surgical wound 04/19/2013  . Abdominal wall abscess 04/18/2013  . Frequent headaches 03/30/2013  . Sleep difficulties 03/30/2013  . Essential hypertension, benign 03/30/2013  . History of cocaine abuse 03/16/2013  . History of schizoaffective disorder 03/16/2013  . Bipolar disorder ([place]) 03/16/2013  . Personality disorder ([place]) 03/16/2013  . Tobacco abuse 03/16/2013  .  Alcohol abuse 03/16/2013  . Palpitations 03/16/2013  . Poor vision 03/16/2013  . History of gastric bypass 03/16/2013  . Status post hysterectomy with oophorectomy 03/16/2013  . Hypothyroid 03/16/2013  . Lupus ([place]) 03/16/2013    Past Surgical History:  Procedure Laterality Date  . ABDOMINAL HYSTERECTOMY  2013   [place]  . ABDOMINAL SURGERY    . ADRENALECTOMY Right   . Billroth II procedure      [place], first 2000, 2005/2006.  Marland Kitchen CHOLECYSTECTOMY    . COLONOSCOPY     in [place]  . tendon repar Right    wrist    Current Outpatient Rx  . Order #: [id number] Class: Print  . Order #: [id number] Class: Normal  . Order #: [id number] Class: Normal  . Order #: [id number] Class: Normal  . Order #: [id number] Class: Print  . Order #: [id number] Class: Historical Med  . Order #: [id number] Class: Print  . Order #: [id number] Class: Print    Allergies Codeine; Morphine and related; and Reglan [metoclopramide]  Family History  Problem Relation Age of Onset  . Brain cancer Son   . Schizophrenia Son   . Cancer Son        brain  . Lung cancer Father   . Cancer Father        mets  . Drug abuse Mother   . Breast cancer Maternal Aunt   . Bipolar disorder Maternal Aunt   . Drug abuse Maternal Aunt   . Colon cancer Maternal Grandmother        late 54s, early 89s  . Drug abuse Sister   . Drug abuse Brother   . Bipolar disorder Paternal Grandfather   . Bipolar disorder Cousin   . Liver disease Neg Hx     Social History Social History   Tobacco Use  . Smoking status: Current Every Day Smoker    Packs/day: 0.50    Years: 35.00    Pack years: 17.50    Types: Cigarettes  . Smokeless tobacco: Never Used  Substance Use Topics  . Alcohol use: No    Alcohol/week: 0.0 oz    Comment: 1 beer 4 days ago  . Drug use: No    Comment: quit 09/2012    Review of Systems  Constitutional: No fever/chills Eyes: No visual changes. ENT: No sore throat. Cardiovascular: Denies chest  pain. Respiratory: Denies shortness of breath. Gastrointestinal: Positive epigastric abdominal pain. Positive nausea, vomiting, and diarrhea.  No constipation. Genitourinary: Negative for dysuria. Musculoskeletal: Negative for back pain. Skin: Negative for rash. Neurological: Negative for headaches, focal weakness or numbness.  10-point ROS otherwise negative.  ____________________________________________   PHYSICAL EXAM:  VITAL SIGNS: ED Triage Vitals  Enc Vitals Group     BP 01/25/17 1836 122/90     Pulse Rate 01/25/17 1836 (!) 126     Resp 01/25/17 1836 18  Temp 01/25/17 1836 98.6 F (37 C)     Temp Source 01/25/17 1836 Oral     SpO2 01/25/17 1836 100 %     Weight 01/25/17 1835 134 lb (60.8 kg)     Height 01/25/17 1835 5\' 3"  (1.6 m)     Pain Score 01/25/17 1834 10    Constitutional: Alert and oriented. Chronically ill-appearing. No acute distress but appears uncomfortable.  Eyes: Conjunctivae are normal. Head: Atraumatic. Nose: No congestion/rhinnorhea. Mouth/Throat: Mucous membranes are moist.  Neck: No stridor.  Cardiovascular: Tachycardia. Good peripheral circulation. Grossly normal heart sounds.   Respiratory: Normal respiratory effort.  No retractions. Lungs CTAB. Gastrointestinal: Soft with epigastric abdominal tenderness to palpation. No tenderness in the lower abdomen. Multiple abdominal scars. No drainage or bleeding from abdominal wound. Dressing with only mild discharge. No distention.  Musculoskeletal: No lower extremity tenderness nor edema. No gross deformities of extremities. Neurologic:  Normal speech and language. No gross focal neurologic deficits are appreciated.  Skin:  Skin is warm, dry and intact. No rash noted.   ____________________________________________   LABS (all labs ordered are listed, but only abnormal results are displayed)  Labs Reviewed  CBC WITH DIFFERENTIAL/PLATELET - Abnormal; Notable for the following components:       Result Value   MCV 74.3 (*)    MCH 25.3 (*)    RDW 18.0 (*)    All other components within normal limits  COMPREHENSIVE METABOLIC PANEL - Abnormal; Notable for the following components:   Sodium 131 (*)    Potassium 3.3 (*)    Chloride 95 (*)    CO2 14 (*)    Glucose, Bld 125 (*)    Total Protein 9.6 (*)    Alkaline Phosphatase 165 (*)    Total Bilirubin 2.0 (*)    Anion gap 22 (*)    All other components within normal limits  LIPASE, BLOOD  URINALYSIS, ROUTINE W REFLEX MICROSCOPIC    EKG:    EKG Interpretation  Date/Time:  Saturday January 25 2017 18:44:45 EST Ventricular Rate:  127 PR Interval:    QRS Duration: 78 QT Interval:  334 QTC Calculation: 486 R Axis:   85 Text Interpretation:  Sinus tachycardia Borderline prolonged QT interval No STEMI.  Confirmed by Nanda Quinton [id number]) on 01/25/2017 7:09:15 PM       ____________________________________________  RADIOLOGY  Dg Abdomen Acute W/chest  Result Date: 01/25/2017 CLINICAL DATA:  Vomiting with abdominal pain EXAM: DG ABDOMEN ACUTE W/ 1V CHEST COMPARISON:  CT 01/12/2017, radiograph 01/04/2017, 10/21/2016 FINDINGS: Single-view chest demonstrates linear scarring or atelectasis in the right upper lung unchanged. Low lung volume. No consolidation. Normal heart size. No pneumothorax. Supine and upright views of the abdomen demonstrate no free air beneath the diaphragm. Postsurgical changes in the right upper and bilateral mid quadrants of the abdomen. Nonobstructed gas pattern with diffuse decreased small bowel gas. Stable calcifications in the right mid abdomen. Vascular calcifications. Calcified pelvic phleboliths. IMPRESSION: 1. No radiographic evidence for acute cardiopulmonary abnormality. 2. Nonobstructed gas pattern Electronically Signed   By: Donavan Foil M.D.   On: 01/25/2017 20:59    ____________________________________________   PROCEDURES  Procedure(s) performed:    Procedures  None ____________________________________________   INITIAL IMPRESSION / ASSESSMENT AND PLAN / ED COURSE  Pertinent labs & imaging results that were available during my care of the patient were reviewed by me and considered in my medical decision making (see chart for details).  Patient presents emergency department for evaluation of epigastric  abdominal pain with nausea, vomiting, diarrhea.  Symptoms began after drinking a beer 4 days ago.  Suspect gastroparesis versus pancreatitis.  Patient did have recent abdominal wall surgery for incision and drainage of 2 small abscesses.  The surgical site is well-appearing with no significant drainage or bleeding.  She has no tenderness in this area.  No reason on my exam or by history to suggest worsening abscess or deeper space infection.  No indication for CT imaging at this time.  Plan for pain control, labs, reassess after IV hydration.  10:49 PM Patient is tolerating PO. Plan for short pain medication refill and Zofran. Last Oxycodone Rx was after surgery on 10/23. Provided #10 tabs and Zofran. Plan is to call GI and PCP on Monday AM.   At this time, I do not feel there is any life-threatening condition present. I have reviewed and discussed all results (EKG, imaging, lab, urine as appropriate), exam findings with patient. I have reviewed nursing notes and appropriate previous records.  I feel the patient is safe to be discharged home without further emergent workup. Discussed usual and customary return precautions. Patient and family (if present) verbalize understanding and are comfortable with this plan.  Patient will follow-up with their primary care provider. If they do not have a primary care provider, information for follow-up has been provided to them. All questions have been answered.  ____________________________________________  FINAL CLINICAL IMPRESSION(S) / ED DIAGNOSES  Final diagnoses:  Epigastric pain   Non-intractable vomiting with nausea, unspecified vomiting type     MEDICATIONS GIVEN DURING THIS VISIT:  Medications  sodium chloride 0.9 % bolus 1,000 mL (0 mLs Intravenous Stopped 01/25/17 2104)  HYDROmorphone (DILAUDID) injection 1 mg (1 mg Intravenous Given 01/25/17 1915)  ondansetron (ZOFRAN) injection 4 mg (4 mg Intravenous Given 01/25/17 1915)     NEW OUTPATIENT MEDICATIONS STARTED DURING THIS VISIT:  Percocet and Zofran   Note:  This document was prepared using Set designer software and may include unintentional dictation errors.  Nanda Quinton, MD Emergency Medicine    Long, [facility], MD 01/25/17 2251

## 2017-01-25 NOTE — ED Notes (Signed)
Pt called to nursing desk, requesting additional pain and nausea medication, Dr Laverta Baltimore notified and at bedside,

## 2017-01-25 NOTE — ED Notes (Signed)
Pt alert & oriented x4, stable gait. Patient given discharge instructions, paperwork & prescription(s). Patient informed not to drive, operate any equipment & handel any important documents 4 hours after taking pain medication. Patient  instructed to stop at the registration desk to finish any additional paperwork. Patient  verbalized understanding. Pt left department w/ no further questions. 

## 2017-01-25 NOTE — ED Triage Notes (Signed)
Pt c/o abd pain, n/v/d x 4 days.  Reports last bm was 2 days ago.  Reports history of pancreatitis.

## 2017-01-25 NOTE — Discharge Instructions (Signed)
You have been seen in the Emergency Department (ED) for abdominal pain.  Your evaluation did not identify a clear cause of your symptoms but was generally reassuring.  Call your Gastroenterologist on Monday to discuss your pain and follow up plan.   Please follow up as instructed above regarding today?s emergent visit and the symptoms that are bothering you.  Return to the ED if your abdominal pain worsens or fails to improve, you develop bloody vomiting, bloody diarrhea, you are unable to tolerate fluids due to vomiting, fever greater than 101, or other symptoms that concern you.

## 2017-01-27 DIAGNOSIS — K219 Gastro-esophageal reflux disease without esophagitis: Secondary | ICD-10-CM | POA: Diagnosis not present

## 2017-01-27 DIAGNOSIS — R112 Nausea with vomiting, unspecified: Secondary | ICD-10-CM | POA: Diagnosis not present

## 2017-01-27 DIAGNOSIS — R69 Illness, unspecified: Secondary | ICD-10-CM | POA: Diagnosis not present

## 2017-01-27 DIAGNOSIS — E279 Disorder of adrenal gland, unspecified: Secondary | ICD-10-CM | POA: Diagnosis not present

## 2017-01-27 DIAGNOSIS — Z79899 Other long term (current) drug therapy: Secondary | ICD-10-CM | POA: Diagnosis not present

## 2017-01-27 DIAGNOSIS — R1013 Epigastric pain: Secondary | ICD-10-CM | POA: Diagnosis not present

## 2017-01-27 DIAGNOSIS — Z7982 Long term (current) use of aspirin: Secondary | ICD-10-CM | POA: Diagnosis not present

## 2017-01-27 DIAGNOSIS — I10 Essential (primary) hypertension: Secondary | ICD-10-CM | POA: Diagnosis not present

## 2017-01-28 ENCOUNTER — Ambulatory Visit (INDEPENDENT_AMBULATORY_CARE_PROVIDER_SITE_OTHER): Payer: Medicare HMO | Admitting: General Surgery

## 2017-01-28 ENCOUNTER — Encounter: Payer: Self-pay | Admitting: General Surgery

## 2017-01-28 VITALS — BP 120/90 | HR 99 | Temp 97.3°F | Resp 18 | Ht 63.0 in | Wt 125.0 lb

## 2017-01-28 DIAGNOSIS — Z09 Encounter for follow-up examination after completed treatment for conditions other than malignant neoplasm: Secondary | ICD-10-CM

## 2017-01-28 NOTE — Progress Notes (Signed)
Subjective:     Angel Price  Status post incision and drainage of abdominal wall cellulitis with abscess.  Patient complaining of incisional pain.  She is being followed by home health.  She has been noted to be drunk when they have visited.  She denies any fevers. Objective:    BP 120/90   Pulse 99   Temp (!) 97.3 F (36.3 C)   Resp 18   Ht 5\' 3"  (1.6 m)   Wt 125 lb (56.7 kg)   BMI 22.14 kg/m   General:  alert, cooperative and no distress  Abdomen soft.  Incision healing well.  One half the staples removed.  Some serous drainage expressed.     Assessment:    Doing well postoperatively.    Plan:   Continue keeping wound clean and dry.  We will see patient in 1 week for follow-up.  No pain medications were given.  Patient has a history of drug abuse.

## 2017-01-30 IMAGING — DX DG CHEST 2V
2 series · 2 of 2 positions shown · non-contrast
Comparison: 03/29/2015

CLINICAL DATA: Dry cough, throat and bilateral ear pain for
months. Shortness of breath.

EXAM:
CHEST  2 VIEW

[chest pa]
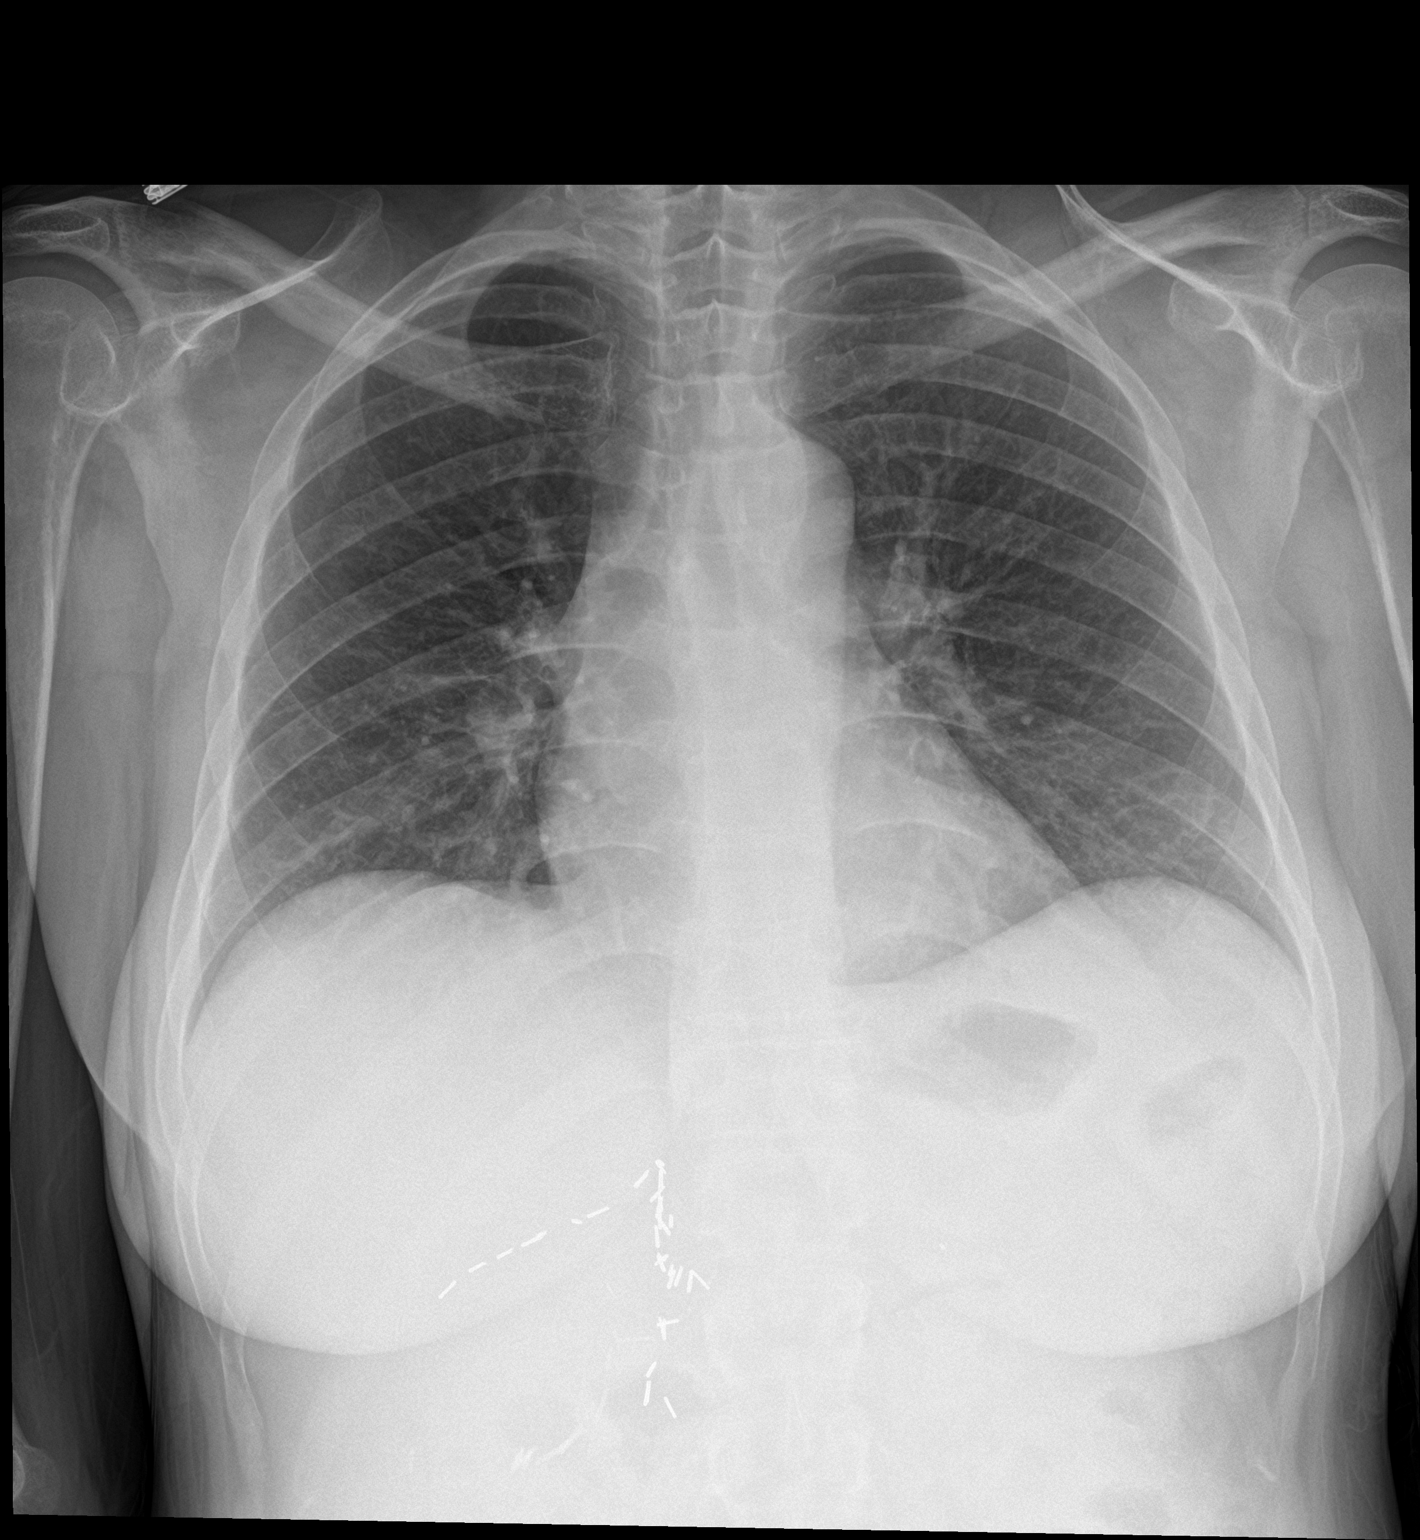

[chest lat]
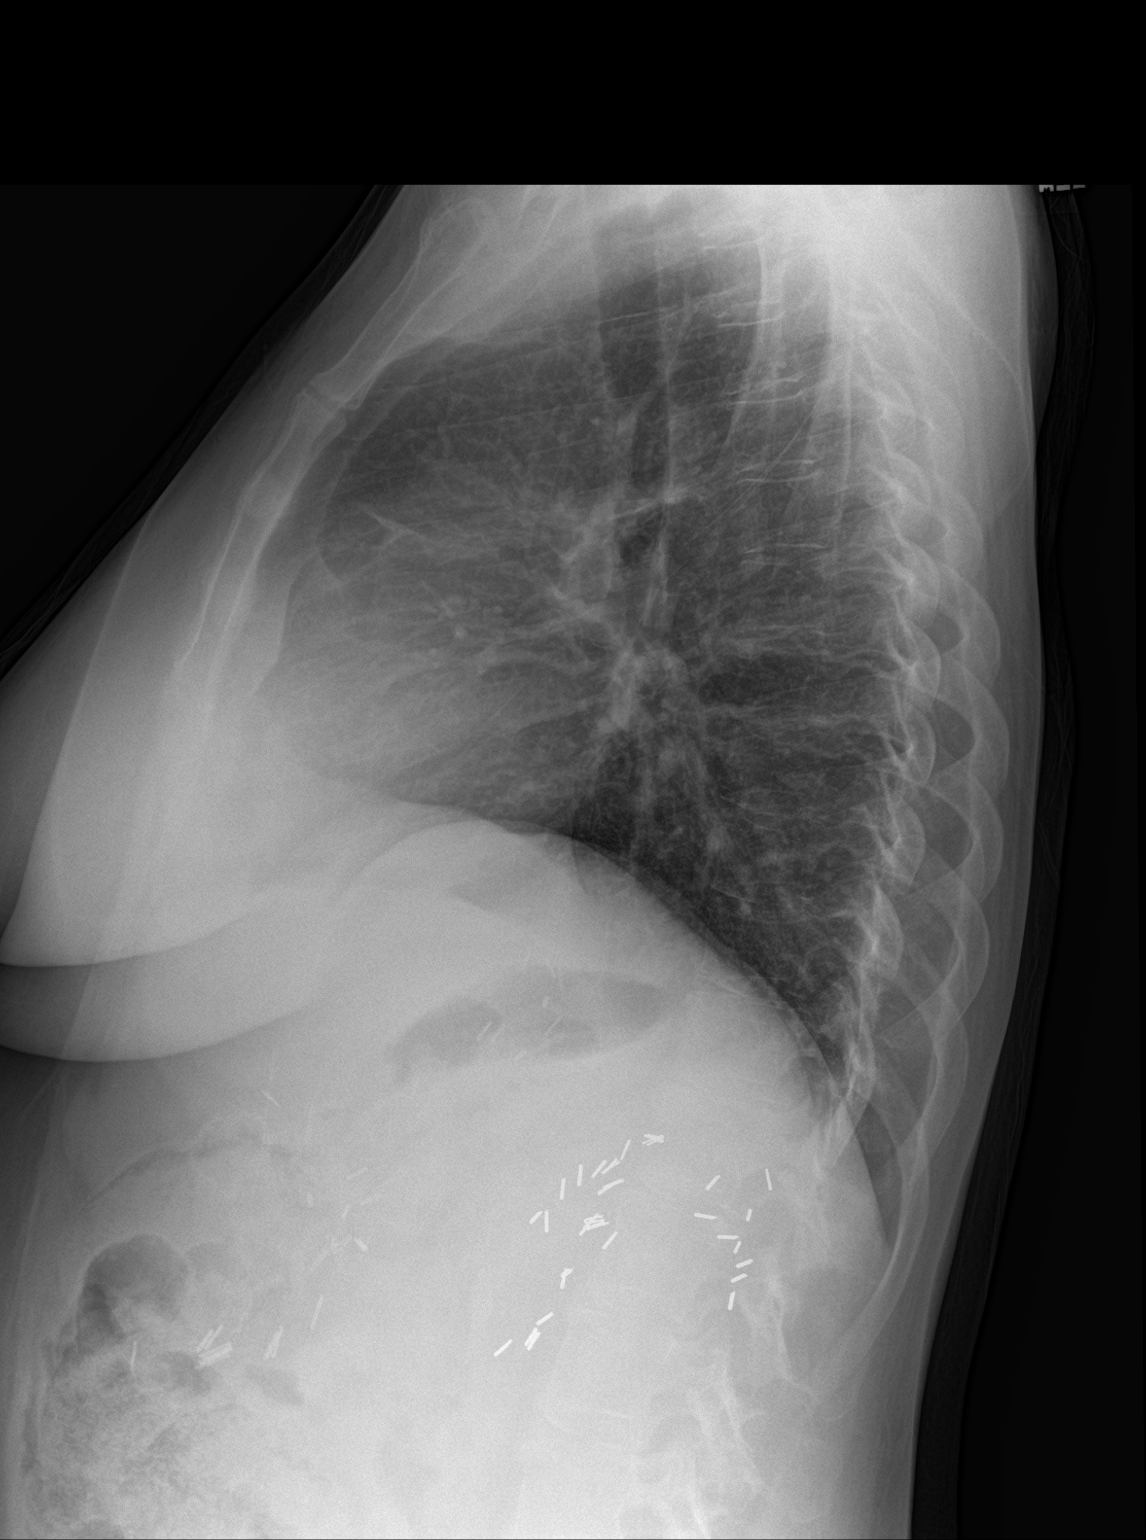

[2 of 2 positions shown; findings below may reference images not displayed]

FINDINGS: The heart size and mediastinal contours are within normal limits.
Both lungs are clear. The visualized skeletal structures are
unremarkable. Surgical clips in the right upper quadrant.
IMPRESSION: No active cardiopulmonary disease.

## 2017-02-02 ENCOUNTER — Encounter (HOSPITAL_COMMUNITY): Payer: Self-pay

## 2017-02-02 ENCOUNTER — Other Ambulatory Visit: Payer: Self-pay

## 2017-02-02 ENCOUNTER — Emergency Department (HOSPITAL_COMMUNITY)
Admission: EM | Admit: 2017-02-02 | Discharge: 2017-02-02 | Disposition: A | Discharge status: Home / Self Care | Payer: Medicare HMO | Attending: Emergency Medicine | Admitting: Emergency Medicine

## 2017-02-02 DIAGNOSIS — Z885 Allergy status to narcotic agent status: Secondary | ICD-10-CM | POA: Diagnosis not present

## 2017-02-02 DIAGNOSIS — Z79899 Other long term (current) drug therapy: Secondary | ICD-10-CM | POA: Insufficient documentation

## 2017-02-02 DIAGNOSIS — Y69 Unspecified misadventure during surgical and medical care: Secondary | ICD-10-CM | POA: Insufficient documentation

## 2017-02-02 DIAGNOSIS — T148XXA Other injury of unspecified body region, initial encounter: Secondary | ICD-10-CM

## 2017-02-02 DIAGNOSIS — Z7982 Long term (current) use of aspirin: Secondary | ICD-10-CM | POA: Insufficient documentation

## 2017-02-02 DIAGNOSIS — L089 Local infection of the skin and subcutaneous tissue, unspecified: Secondary | ICD-10-CM

## 2017-02-02 DIAGNOSIS — E0590 Thyrotoxicosis, unspecified without thyrotoxic crisis or storm: Secondary | ICD-10-CM | POA: Insufficient documentation

## 2017-02-02 DIAGNOSIS — G8918 Other acute postprocedural pain: Secondary | ICD-10-CM | POA: Diagnosis present

## 2017-02-02 DIAGNOSIS — I10 Essential (primary) hypertension: Secondary | ICD-10-CM | POA: Diagnosis not present

## 2017-02-02 DIAGNOSIS — F17210 Nicotine dependence, cigarettes, uncomplicated: Secondary | ICD-10-CM | POA: Diagnosis not present

## 2017-02-02 DIAGNOSIS — T8140XA Infection following a procedure, unspecified, initial encounter: Secondary | ICD-10-CM | POA: Insufficient documentation

## 2017-02-02 DIAGNOSIS — R69 Illness, unspecified: Secondary | ICD-10-CM | POA: Diagnosis not present

## 2017-02-02 LAB — BASIC METABOLIC PANEL
ANION GAP: 7 (ref 5–15)
BUN: 9 mg/dL (ref 6–20)
CHLORIDE: 105 mmol/L (ref 101–111)
CO2: 27 mmol/L (ref 22–32)
Calcium: 8.9 mg/dL (ref 8.9–10.3)
Creatinine, Ser: 0.46 mg/dL (ref 0.44–1.00)
GFR calc Af Amer: >60 mL/min (ref >60)
Glucose, Bld: 102 mg/dL — ABNORMAL HIGH (ref 65–99)
POTASSIUM: 2.8 mmol/L — AB (ref 3.5–5.1)
SODIUM: 139 mmol/L (ref 135–145)

## 2017-02-02 LAB — CBC WITH DIFFERENTIAL/PLATELET
BASOS ABS: 0 10*3/uL (ref 0.0–0.1)
Basophils Relative: 0 %
EOS ABS: 0.1 10*3/uL (ref 0.0–0.7)
EOS PCT: 1 %
HCT: 27.2 % — ABNORMAL LOW (ref 36.0–46.0)
HEMOGLOBIN: 9.1 g/dL — AB (ref 12.0–15.0)
LYMPHS ABS: 3 10*3/uL (ref 0.7–4.0)
LYMPHS PCT: 44 %
MCH: 24.9 pg — AB (ref 26.0–34.0)
MCHC: 33.5 g/dL (ref 30.0–36.0)
MCV: 74.3 fL — AB (ref 78.0–100.0)
Monocytes Absolute: 1 10*3/uL (ref 0.1–1.0)
Monocytes Relative: 14 %
NEUTROS PCT: 41 %
Neutro Abs: 2.9 10*3/uL (ref 1.7–7.7)
PLATELETS: 333 10*3/uL (ref 150–400)
RBC: 3.66 MIL/uL — AB (ref 3.87–5.11)
RDW: 18.4 % — ABNORMAL HIGH (ref 11.5–15.5)
WBC: 6.9 10*3/uL (ref 4.0–10.5)

## 2017-02-02 MED ORDER — POTASSIUM CHLORIDE CRYS ER 20 MEQ PO TBCR
40.0000 meq | EXTENDED_RELEASE_TABLET | Freq: Once | ORAL | Status: AC
  Administered 2017-02-02: 40 meq via ORAL
  Filled 2017-02-02: qty 2

## 2017-02-02 MED ORDER — SULFAMETHOXAZOLE-TRIMETHOPRIM 800-160 MG PO TABS: 1.0000 | ORAL_TABLET | Freq: Two times a day (BID) | ORAL | 0 refills | Status: AC

## 2017-02-02 MED ORDER — STERILE WATER FOR INJECTION IJ SOLN
INTRAMUSCULAR | Status: AC
  Filled 2017-02-02: qty 10

## 2017-02-02 MED ORDER — CEFTRIAXONE SODIUM 1 G IJ SOLR
1.0000 g | Freq: Once | INTRAMUSCULAR | Status: AC
  Administered 2017-02-02: 1 g via INTRAMUSCULAR
  Filled 2017-02-02: qty 10

## 2017-02-02 MED ORDER — OXYCODONE-ACETAMINOPHEN 5-325 MG PO TABS
1.0000 | ORAL_TABLET | Freq: Once | ORAL | Status: AC
  Administered 2017-02-02: 1 via ORAL
  Filled 2017-02-02: qty 1

## 2017-02-02 NOTE — ED Triage Notes (Signed)
Pt reports having surgery on her abdomen last month. Pt states she has had to change her dressing 3 times today and there is an odor coming from the incision. Pt states Dr. Arnoldo Morale did her surgery.
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
## 2017-02-02 NOTE — Discharge Instructions (Signed)
Follow-up Tuesday as planned with your doctor.  Also follow-up with your family doctor next week to recheck your potassium in your blood pressure

## 2017-02-04 ENCOUNTER — Encounter: Payer: Self-pay | Admitting: General Surgery

## 2017-02-04 ENCOUNTER — Ambulatory Visit (INDEPENDENT_AMBULATORY_CARE_PROVIDER_SITE_OTHER): Payer: Self-pay | Admitting: General Surgery

## 2017-02-04 VITALS — BP 168/110 | HR 92 | Temp 96.4°F | Resp 18 | Ht 63.0 in | Wt 139.0 lb

## 2017-02-04 DIAGNOSIS — Z09 Encounter for follow-up examination after completed treatment for conditions other than malignant neoplasm: Secondary | ICD-10-CM

## 2017-02-04 NOTE — Progress Notes (Signed)
Subjective:     Angel Price  Here for follow-up wound check.  She has been discharged from home health due to noncompliance.  She states she has some whitish drainage from the wound, which has been chronic in nature.  She also has other wounds that chronically draining. Objective:    BP (!) 168/110   Pulse 92   Temp (!) 96.4 F (35.8 C)   Resp 18   Ht 5\' 3"  (1.6 m)   Wt 139 lb (63 kg)   BMI 24.62 kg/m   General:  alert, cooperative and no distress  Remaining staples removed from wound.  Minimal white drainage noted.  No induration noted.  No erythema noted.     Assessment:    History of chronically draining abdominal wall wounds.  No need for antibiotics.    Plan:   Keep wounds clean and dry daily with soap and water.  Follow-up as needed.

## 2017-02-05 LAB — AEROBIC CULTURE  (SUPERFICIAL SPECIMEN)

## 2017-02-05 LAB — AEROBIC CULTURE W GRAM STAIN (SUPERFICIAL SPECIMEN): Gram Stain: NONE SEEN

## 2017-02-05 IMAGING — CT CT ABD-PELV W/ CM
2 of 5 series · 16 of 46 positions shown, 18 images · IV contrast (iopamidol)
Comparison: 03/16/2015

CLINICAL DATA: Epigastric pain radiating to the chest. Started
yesterday.

EXAM:
CT ABDOMEN AND PELVIS WITH CONTRAST
TECHNIQUE: Multidetector CT imaging of the abdomen and pelvis was performed
using the standard protocol following bolus administration of
intravenous contrast.
CONTRAST:  100mL NFIBSK-MYY IOPAMIDOL (NFIBSK-MYY) INJECTION 61%

[Series 2: routine abd pel with · axial · 0.64mm/px · z∈[-427,-27]mm · 13 of 92 slices shown, 15 images]
[im 6/92  soft-tissue]
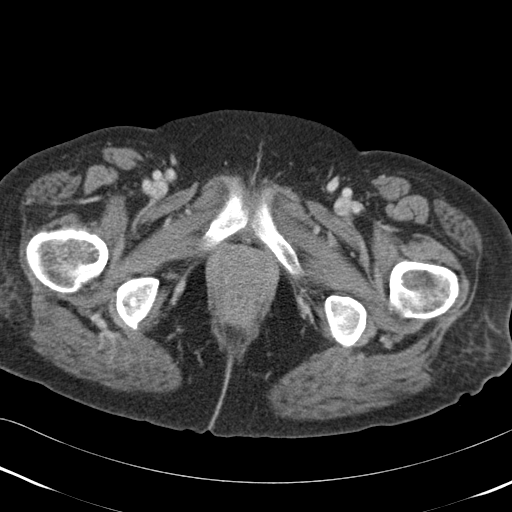
[im 6/92  bone]
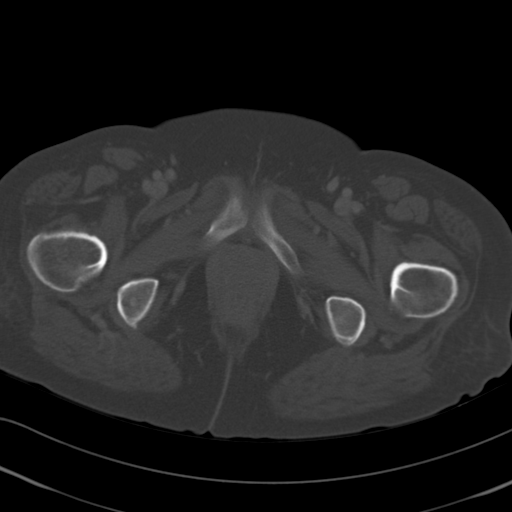
[im 11/92  soft-tissue]
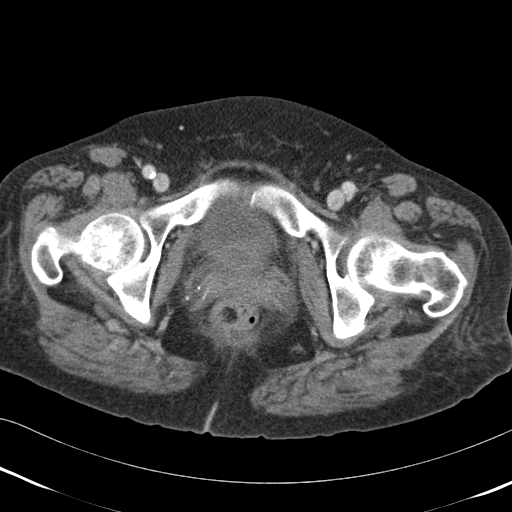
[im 21/92  soft-tissue]
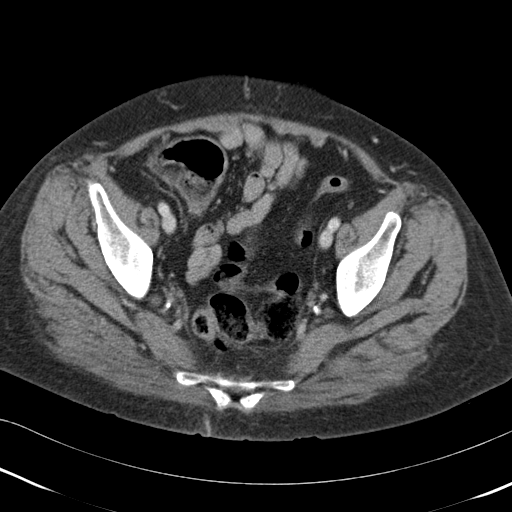
[im 26/92  soft-tissue]
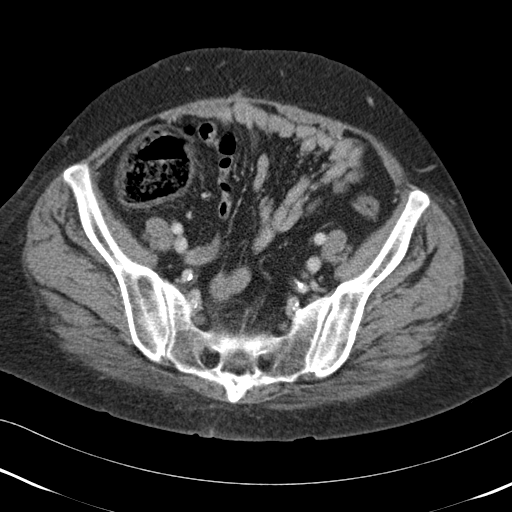
[im 31/92  soft-tissue]
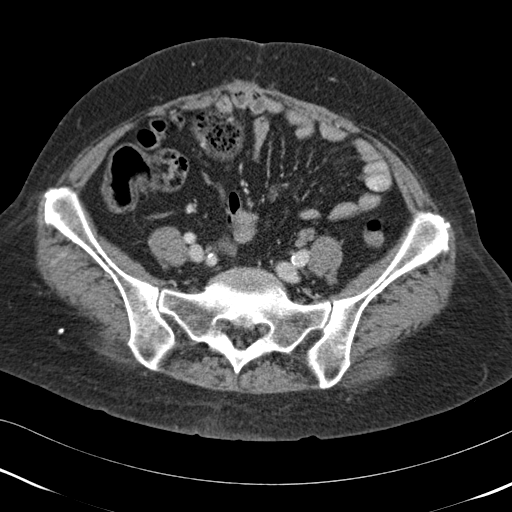
[im 41/92  soft-tissue]
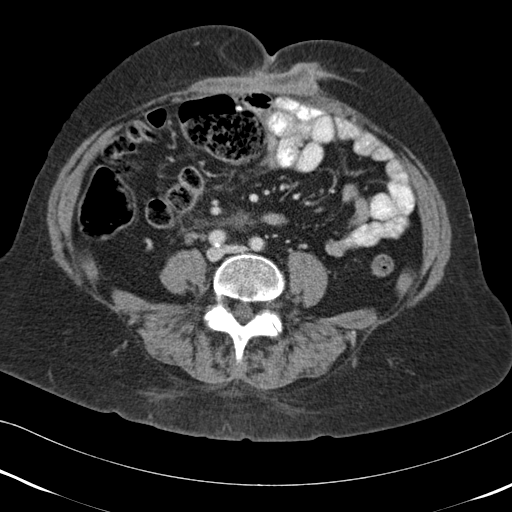
[im 46/92  soft-tissue]
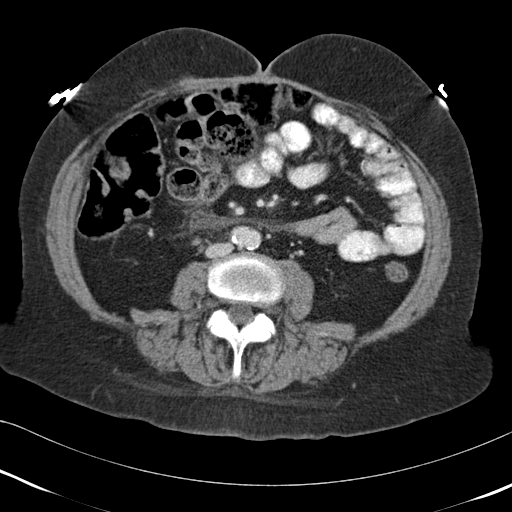
[im 51/92  soft-tissue]
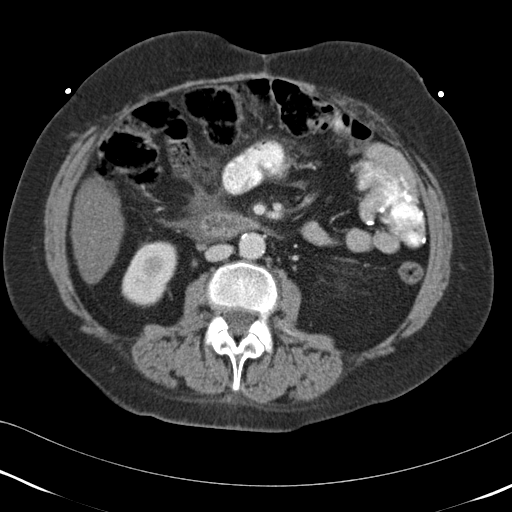
[im 61/92  soft-tissue]
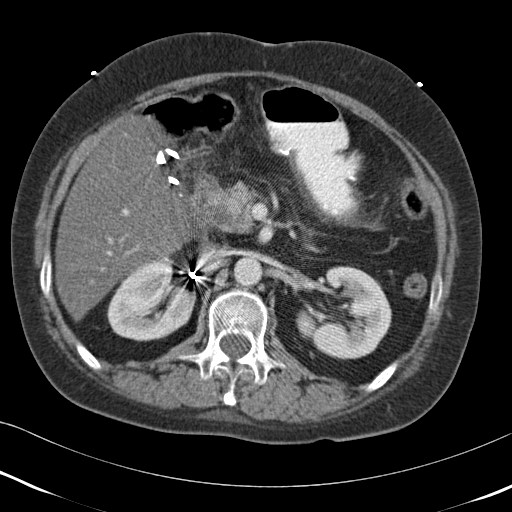
[im 61/92  bone]
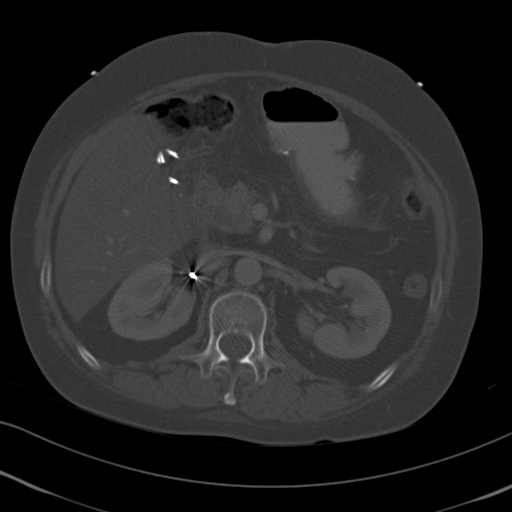
[im 66/92  soft-tissue]
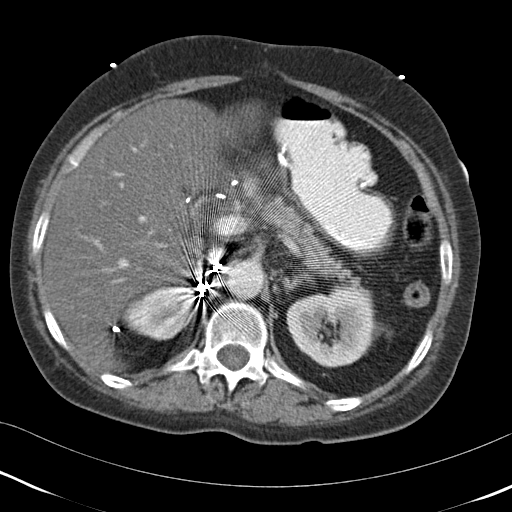
[im 71/92  soft-tissue]
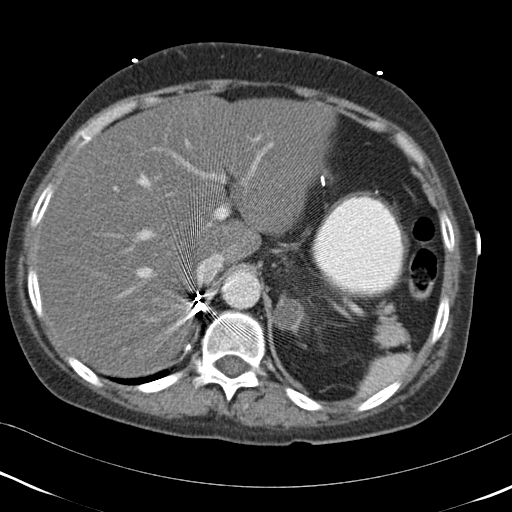
[im 81/92  soft-tissue]
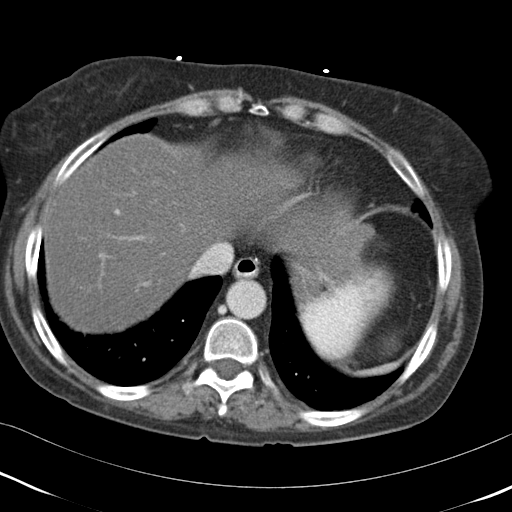
[im 86/92  soft-tissue]
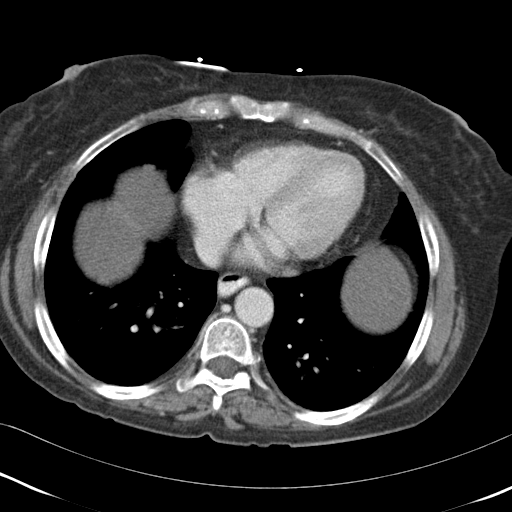

[Series 3: coronal · coronal · 0.73mm/px · 3 of 136 slices shown]
[im 46/136  soft-tissue]
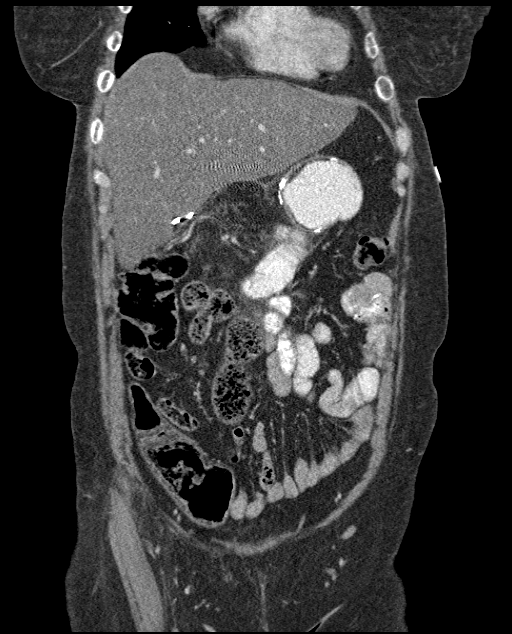
[im 61/136  soft-tissue]
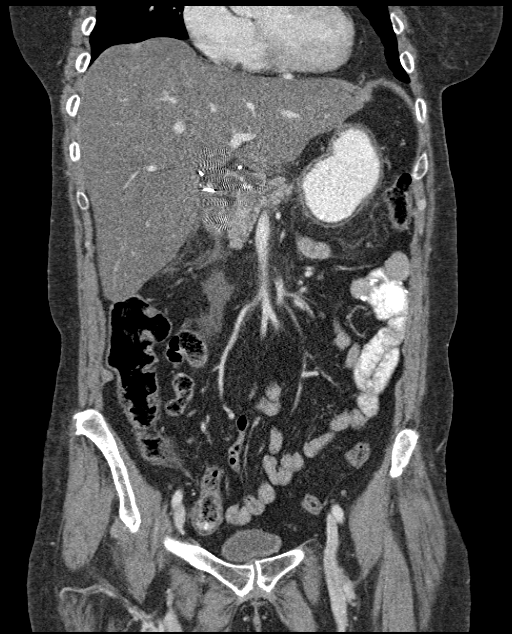
[im 76/136  soft-tissue]
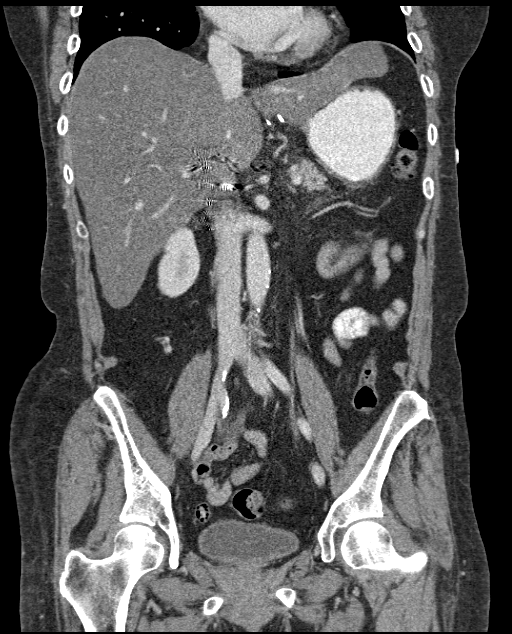

[16 of 46 positions shown; findings below may reference images not displayed]

FINDINGS: Patchy airspace disease in both lower lungs and in the perihilar
regions. This could represent multifocal pneumonia or pulmonary
edema. Residual contrast material in the lower esophagus could
indicate reflux or dysmotility. Surgical clips at the EG junction.

Diffuse fatty infiltration of the liver. Surgical absence of the
gallbladder. No bile duct dilatation. Postoperative changes in the
right upper quadrant consistent with prior right adrenalectomy and
partial gastrectomy with small bowel anastomosis. There is
infiltration and edema in the fat around the head and body of the
pancreas as well as around the C-loop of the duodenum with extension
along the anterior retroperitoneum and up around the celiac axis.
This is consistent with inflammatory process, likely pancreatitis or
duodenitis. Follow-up after resolution of acute process is
recommended to exclude underlying pancreatic lesion. 2 cm left
adrenal gland nodule is unchanged since previous study. The spleen,
kidneys, inferior vena cava, and retroperitoneal lymph nodes are
unremarkable. Scattered calcification in the abdominal aorta without
aneurysm. Stomach, small bowel, and colon are not abnormally
distended. No free air or free fluid in the abdomen. There is
scarring in the anterior abdominal wall without change since prior
study.

Pelvis: Appendix is not identified. Uterus is surgically absent. No
pelvic mass or lymphadenopathy. No free or loculated pelvic fluid
collection. Bladder wall is not thickened. No destructive bone
lesions.
IMPRESSION: Infiltration edema around the head of the pancreas, duodenum, and
celiac axis. This is most likely inflammatory and suggest
pancreatitis and/or duodenitis. Followup after resolution of acute
process is recommended to exclude underlying pancreatic lesion.
Unchanged left adrenal gland nodule. Diffuse fatty infiltration of
the liver. Postoperative changes in the abdomen as described.

## 2017-02-05 IMAGING — DX DG ABDOMEN ACUTE W/ 1V CHEST
3 series · 3 of 3 positions shown · non-contrast
Comparison: Chest radiograph dated 09/25/2015

CLINICAL DATA: 42-year-old female with abdominal pain radiating to
the left chest.

EXAM:
DG ABDOMEN ACUTE W/ 1V CHEST

[chest pa]
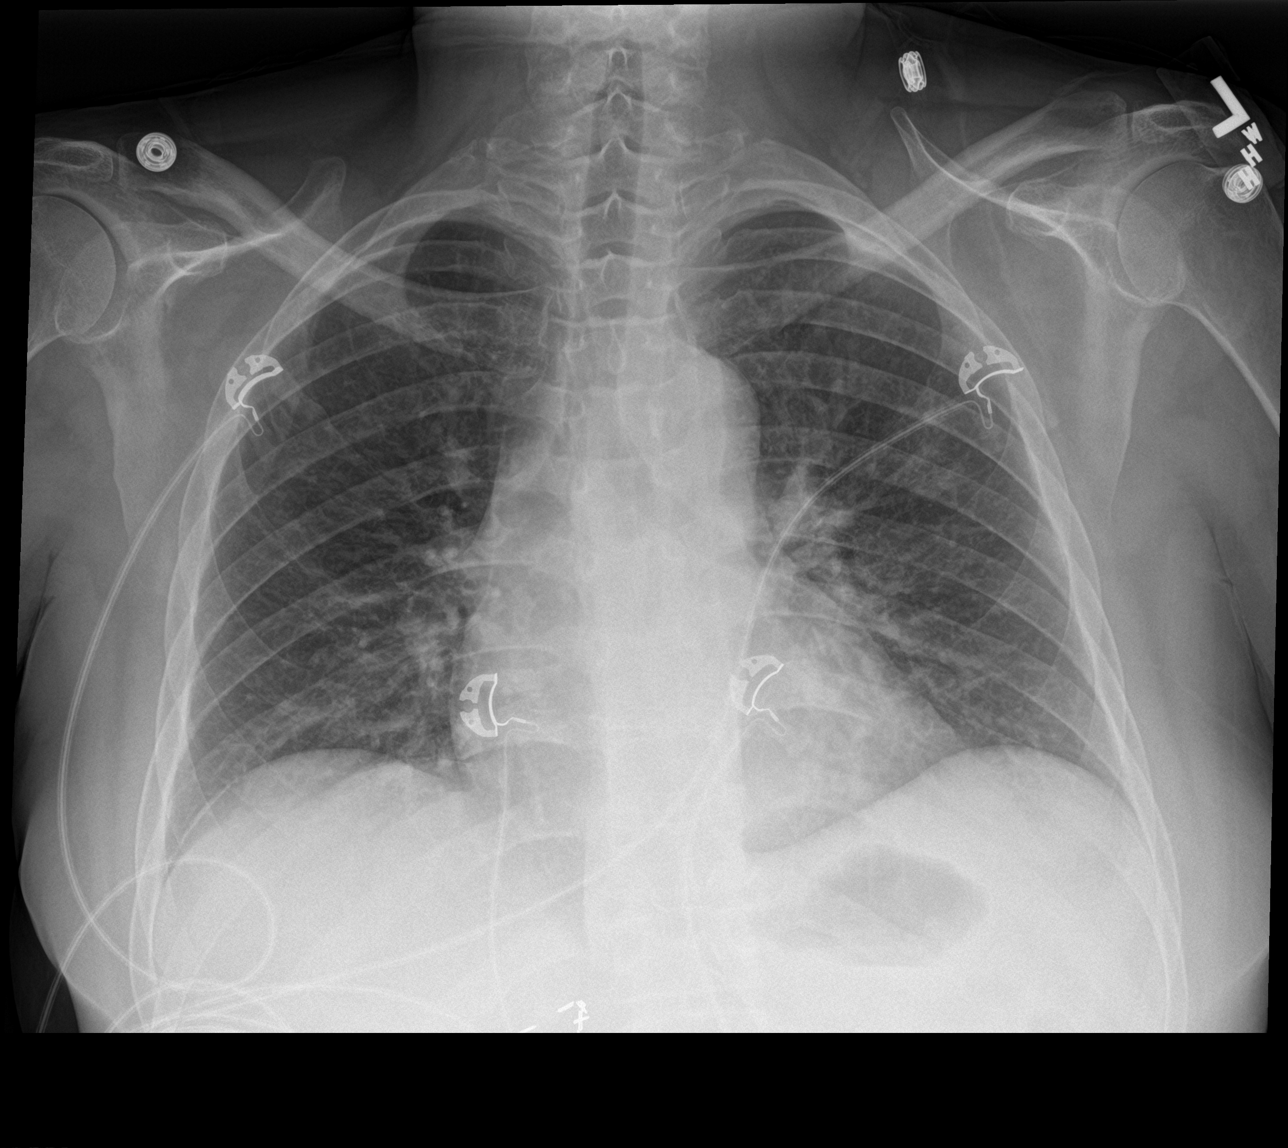

[abdomen erect]
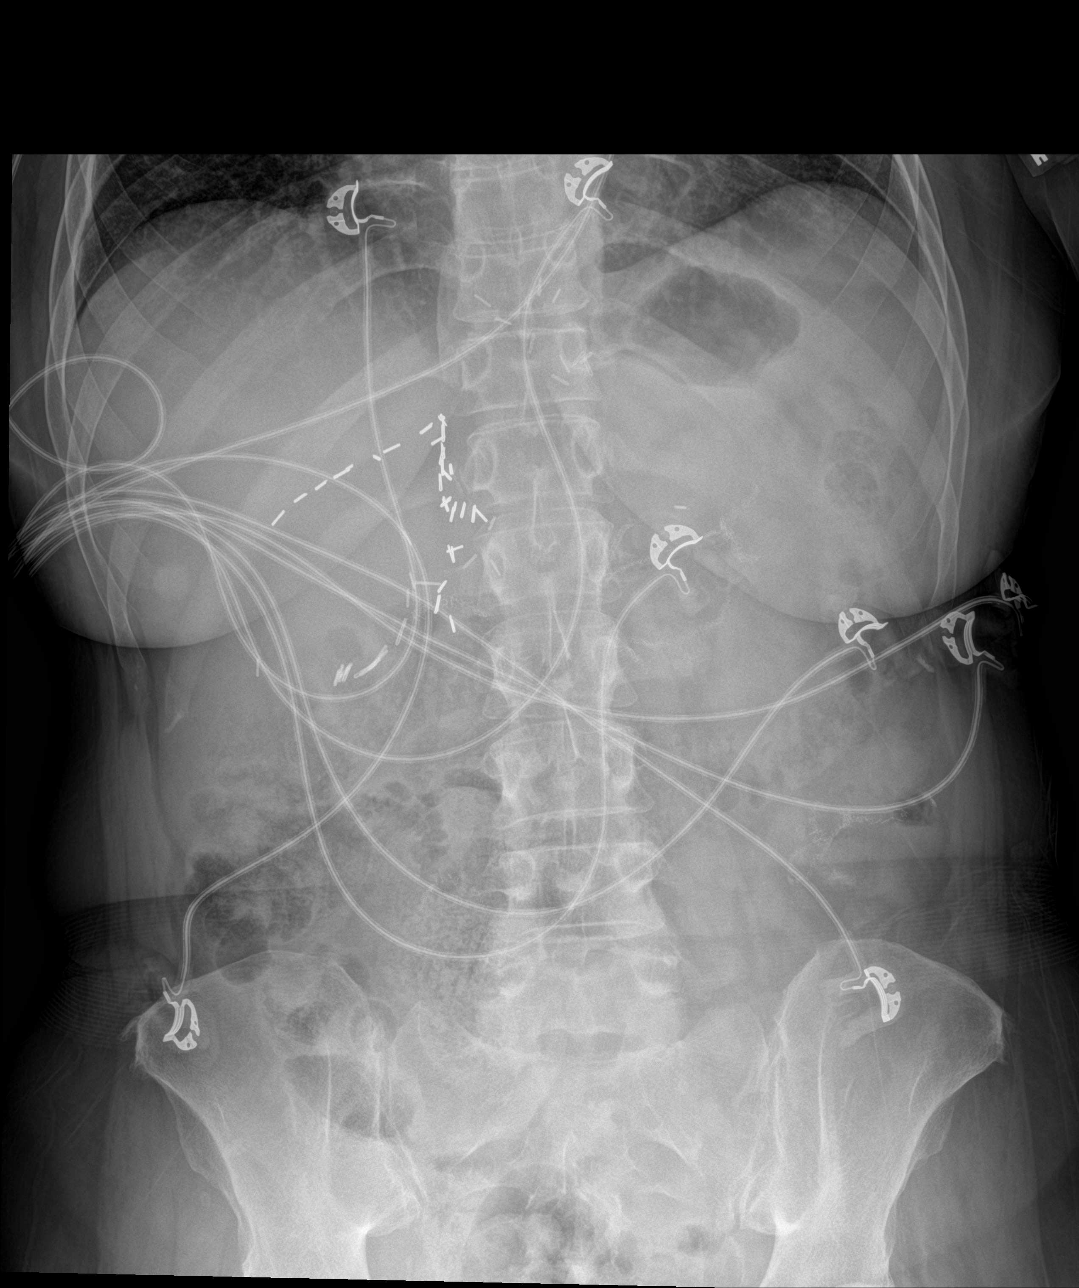

[abdomen supine]
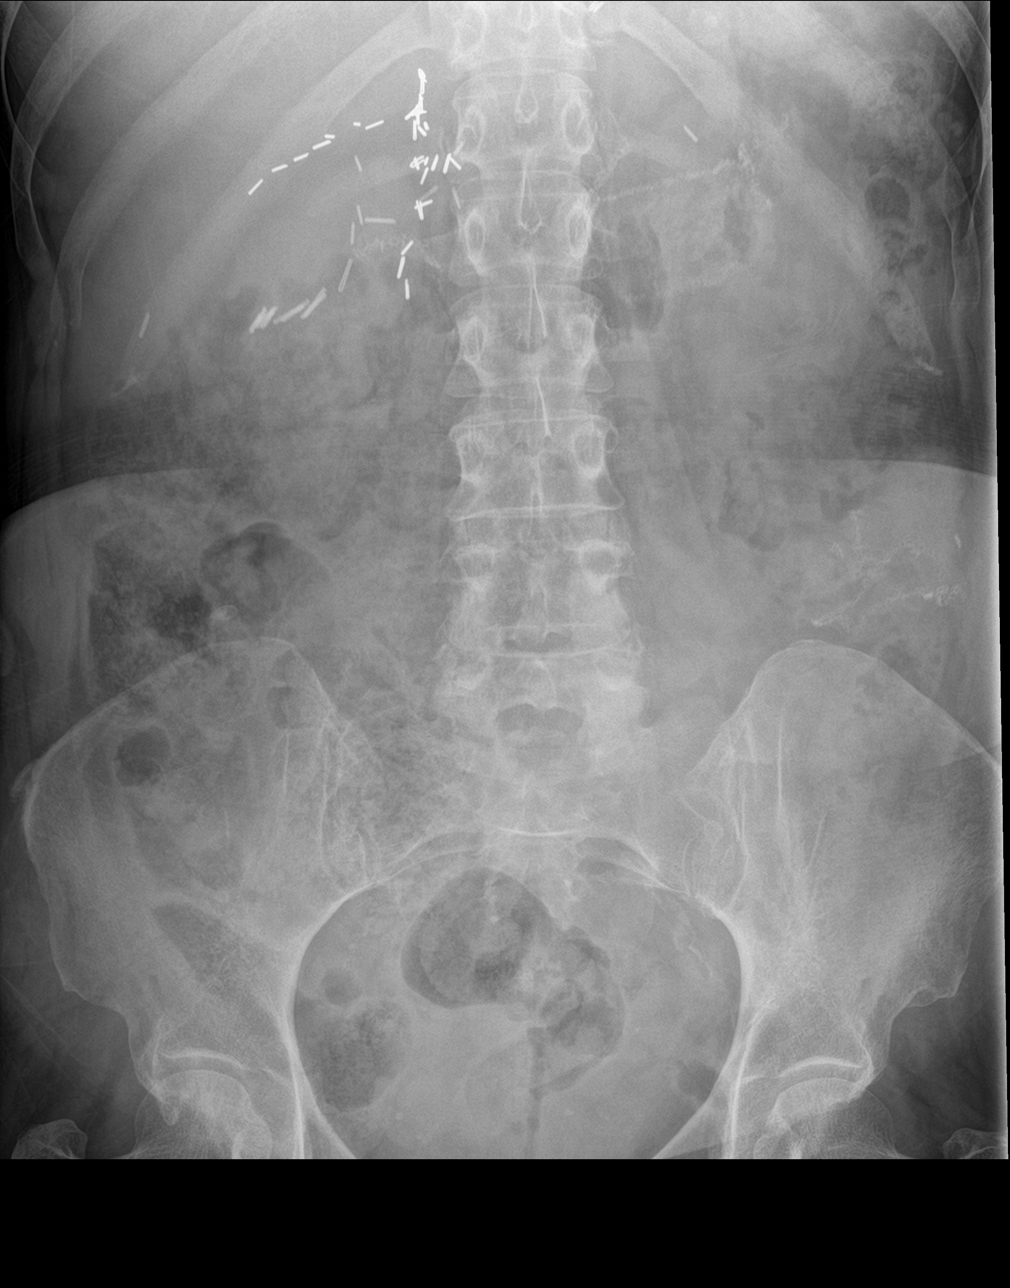

[3 of 3 positions shown; findings below may reference images not displayed]

FINDINGS: Bibasilar interstitial prominence and nodularity may represent
atelectatic changes. Developing pneumonia is not excluded. No focal
consolidation, pleural effusion, or pneumothorax. The cardiac
silhouette is within normal limits.

There are multiple surgical clips in the upper abdomen and
epigastric area. Bowel suture noted in the upper abdomen. There is
moderate stool throughout the colon. No bowel dilatation or evidence
of obstruction. No free air. A 12 mm nodular density in the right
upper abdomen likely corresponds the nipple shadow. A Foley catheter
extends to the bladder area. No acute osseous pathology.
IMPRESSION: Constipation.  No evidence of bowel obstruction or free air.

Bibasilar atelectatic changes. Developing pneumonia is not excluded.
Clinical correlation is recommended.

## 2017-02-06 ENCOUNTER — Telehealth: Payer: Self-pay | Admitting: *Deleted

## 2017-02-06 NOTE — Telephone Encounter (Signed)
Post ED Visit - Positive Culture Follow-up  Culture report reviewed by antimicrobial stewardship pharmacist:  []  Elenor Quinones, Pharm.D. []  Heide Guile, Pharm.D., BCPS AQ-ID []  Parks Neptune, Pharm.D., BCPS []  Alycia Rossetti, Pharm.D., BCPS []  [place], Pharm.D., BCPS, AAHIVP []  Legrand Como, Pharm.D., BCPS, AAHIVP [x]  Salome Arnt, PharmD, BCPS []  Dimitri Ped, PharmD, BCPS []  Vincenza Hews, PharmD, BCPS  Positive wound culture Treated with Sulfamethoxazole-trimethoprim, organism sensitive to the same and no further patient follow-up is required at this time.  Harlon Flor [facility] 02/06/2017, 2:14 PM

## 2017-02-26 ENCOUNTER — Telehealth: Payer: Self-pay | Admitting: Gastroenterology

## 2017-02-26 ENCOUNTER — Encounter: Payer: Self-pay | Admitting: Internal Medicine

## 2017-02-26 ENCOUNTER — Ambulatory Visit: Payer: Medicare HMO | Admitting: Gastroenterology

## 2017-02-26 NOTE — Telephone Encounter (Signed)
PATIENT WAS A NO SHOW AND LETTER SENT  °

## 2017-04-26 ENCOUNTER — Emergency Department (HOSPITAL_COMMUNITY): Payer: Medicare Other

## 2017-04-26 ENCOUNTER — Emergency Department (HOSPITAL_COMMUNITY)
Admission: EM | Admit: 2017-04-26 | Discharge: 2017-04-26 | Disposition: A | Discharge status: Home / Self Care | Payer: Medicare Other | Attending: Emergency Medicine | Admitting: Emergency Medicine

## 2017-04-26 ENCOUNTER — Encounter (HOSPITAL_COMMUNITY): Payer: Self-pay | Admitting: Emergency Medicine

## 2017-04-26 DIAGNOSIS — S299XXA Unspecified injury of thorax, initial encounter: Secondary | ICD-10-CM | POA: Diagnosis not present

## 2017-04-26 DIAGNOSIS — S0083XA Contusion of other part of head, initial encounter: Secondary | ICD-10-CM

## 2017-04-26 DIAGNOSIS — E039 Hypothyroidism, unspecified: Secondary | ICD-10-CM | POA: Insufficient documentation

## 2017-04-26 DIAGNOSIS — Y999 Unspecified external cause status: Secondary | ICD-10-CM | POA: Insufficient documentation

## 2017-04-26 DIAGNOSIS — I10 Essential (primary) hypertension: Secondary | ICD-10-CM | POA: Diagnosis not present

## 2017-04-26 DIAGNOSIS — Y9389 Activity, other specified: Secondary | ICD-10-CM | POA: Diagnosis not present

## 2017-04-26 DIAGNOSIS — S0993XA Unspecified injury of face, initial encounter: Secondary | ICD-10-CM | POA: Diagnosis not present

## 2017-04-26 DIAGNOSIS — R51 Headache: Secondary | ICD-10-CM | POA: Diagnosis not present

## 2017-04-26 DIAGNOSIS — S069X9A Unspecified intracranial injury with loss of consciousness of unspecified duration, initial encounter: Secondary | ICD-10-CM | POA: Diagnosis not present

## 2017-04-26 DIAGNOSIS — M25512 Pain in left shoulder: Secondary | ICD-10-CM | POA: Diagnosis not present

## 2017-04-26 DIAGNOSIS — F17210 Nicotine dependence, cigarettes, uncomplicated: Secondary | ICD-10-CM | POA: Insufficient documentation

## 2017-04-26 DIAGNOSIS — S20212A Contusion of left front wall of thorax, initial encounter: Secondary | ICD-10-CM

## 2017-04-26 DIAGNOSIS — Y929 Unspecified place or not applicable: Secondary | ICD-10-CM | POA: Diagnosis not present

## 2017-04-26 DIAGNOSIS — S4992XA Unspecified injury of left shoulder and upper arm, initial encounter: Secondary | ICD-10-CM | POA: Diagnosis not present

## 2017-04-26 DIAGNOSIS — S7002XA Contusion of left hip, initial encounter: Secondary | ICD-10-CM | POA: Diagnosis not present

## 2017-04-26 DIAGNOSIS — S0990XA Unspecified injury of head, initial encounter: Secondary | ICD-10-CM | POA: Diagnosis not present

## 2017-04-26 DIAGNOSIS — S161XXA Strain of muscle, fascia and tendon at neck level, initial encounter: Secondary | ICD-10-CM | POA: Diagnosis not present

## 2017-04-26 DIAGNOSIS — Z79899 Other long term (current) drug therapy: Secondary | ICD-10-CM | POA: Diagnosis not present

## 2017-04-26 DIAGNOSIS — S199XXA Unspecified injury of neck, initial encounter: Secondary | ICD-10-CM | POA: Diagnosis not present

## 2017-04-26 MED ORDER — HYDROCODONE-ACETAMINOPHEN 5-325 MG PO TABS
1.0000 | ORAL_TABLET | Freq: Once | ORAL | Status: AC
  Administered 2017-04-26: 1 via ORAL
  Filled 2017-04-26: qty 1

## 2017-04-26 MED ORDER — NAPROXEN 500 MG PO TABS: 500.0000 mg | ORAL_TABLET | Freq: Two times a day (BID) | ORAL | 0 refills | Status: DC

## 2017-04-26 MED ORDER — CYCLOBENZAPRINE HCL 10 MG PO TABS: 10.0000 mg | ORAL_TABLET | Freq: Three times a day (TID) | ORAL | 0 refills | Status: DC | PRN
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
## 2017-04-26 NOTE — Discharge Instructions (Signed)
Apply ice packs on and off to your ribs and to your neck and hip as needed.  Follow-up with your primary doctor for recheck later this week.  Return to the ER for any worsening symptoms.
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
## 2017-05-09 ENCOUNTER — Encounter (HOSPITAL_COMMUNITY): Payer: Self-pay | Admitting: Emergency Medicine

## 2017-05-09 ENCOUNTER — Other Ambulatory Visit: Payer: Self-pay

## 2017-05-09 ENCOUNTER — Inpatient Hospital Stay (HOSPITAL_COMMUNITY)
Admission: EM | Admit: 2017-05-09 | Discharge: 2017-05-23 | DRG: 394 | Disposition: A | Discharge status: Home / Self Care | Payer: Medicare Other | Attending: Family Medicine | Admitting: Family Medicine

## 2017-05-09 DIAGNOSIS — D638 Anemia in other chronic diseases classified elsewhere: Secondary | ICD-10-CM | POA: Diagnosis not present

## 2017-05-09 DIAGNOSIS — K449 Diaphragmatic hernia without obstruction or gangrene: Secondary | ICD-10-CM | POA: Diagnosis not present

## 2017-05-09 DIAGNOSIS — D573 Sickle-cell trait: Secondary | ICD-10-CM | POA: Diagnosis present

## 2017-05-09 DIAGNOSIS — E039 Hypothyroidism, unspecified: Secondary | ICD-10-CM | POA: Diagnosis not present

## 2017-05-09 DIAGNOSIS — K56609 Unspecified intestinal obstruction, unspecified as to partial versus complete obstruction: Secondary | ICD-10-CM

## 2017-05-09 DIAGNOSIS — S31109A Unspecified open wound of abdominal wall, unspecified quadrant without penetration into peritoneal cavity, initial encounter: Secondary | ICD-10-CM | POA: Diagnosis not present

## 2017-05-09 DIAGNOSIS — S31109D Unspecified open wound of abdominal wall, unspecified quadrant without penetration into peritoneal cavity, subsequent encounter: Secondary | ICD-10-CM | POA: Diagnosis not present

## 2017-05-09 DIAGNOSIS — G8929 Other chronic pain: Secondary | ICD-10-CM | POA: Diagnosis not present

## 2017-05-09 DIAGNOSIS — D649 Anemia, unspecified: Secondary | ICD-10-CM | POA: Diagnosis present

## 2017-05-09 DIAGNOSIS — K921 Melena: Secondary | ICD-10-CM | POA: Diagnosis present

## 2017-05-09 DIAGNOSIS — Z0189 Encounter for other specified special examinations: Secondary | ICD-10-CM

## 2017-05-09 DIAGNOSIS — D62 Acute posthemorrhagic anemia: Secondary | ICD-10-CM | POA: Diagnosis not present

## 2017-05-09 DIAGNOSIS — K632 Fistula of intestine: Secondary | ICD-10-CM | POA: Diagnosis not present

## 2017-05-09 DIAGNOSIS — K922 Gastrointestinal hemorrhage, unspecified: Secondary | ICD-10-CM | POA: Diagnosis not present

## 2017-05-09 DIAGNOSIS — K625 Hemorrhage of anus and rectum: Secondary | ICD-10-CM | POA: Diagnosis not present

## 2017-05-09 DIAGNOSIS — F419 Anxiety disorder, unspecified: Secondary | ICD-10-CM | POA: Diagnosis present

## 2017-05-09 DIAGNOSIS — F332 Major depressive disorder, recurrent severe without psychotic features: Secondary | ICD-10-CM | POA: Diagnosis present

## 2017-05-09 DIAGNOSIS — Z9884 Bariatric surgery status: Secondary | ICD-10-CM

## 2017-05-09 DIAGNOSIS — Z79899 Other long term (current) drug therapy: Secondary | ICD-10-CM

## 2017-05-09 DIAGNOSIS — F329 Major depressive disorder, single episode, unspecified: Secondary | ICD-10-CM | POA: Diagnosis present

## 2017-05-09 DIAGNOSIS — I10 Essential (primary) hypertension: Secondary | ICD-10-CM | POA: Diagnosis present

## 2017-05-09 DIAGNOSIS — K644 Residual hemorrhoidal skin tags: Secondary | ICD-10-CM | POA: Diagnosis present

## 2017-05-09 DIAGNOSIS — E538 Deficiency of other specified B group vitamins: Secondary | ICD-10-CM | POA: Diagnosis present

## 2017-05-09 DIAGNOSIS — L089 Local infection of the skin and subcutaneous tissue, unspecified: Secondary | ICD-10-CM | POA: Diagnosis not present

## 2017-05-09 DIAGNOSIS — R042 Hemoptysis: Secondary | ICD-10-CM

## 2017-05-09 DIAGNOSIS — Z888 Allergy status to other drugs, medicaments and biological substances status: Secondary | ICD-10-CM | POA: Diagnosis not present

## 2017-05-09 DIAGNOSIS — Z915 Personal history of self-harm: Secondary | ICD-10-CM

## 2017-05-09 DIAGNOSIS — E876 Hypokalemia: Secondary | ICD-10-CM | POA: Diagnosis not present

## 2017-05-09 DIAGNOSIS — K3184 Gastroparesis: Secondary | ICD-10-CM | POA: Diagnosis not present

## 2017-05-09 DIAGNOSIS — K648 Other hemorrhoids: Secondary | ICD-10-CM | POA: Diagnosis not present

## 2017-05-09 DIAGNOSIS — K219 Gastro-esophageal reflux disease without esophagitis: Secondary | ICD-10-CM | POA: Diagnosis not present

## 2017-05-09 DIAGNOSIS — R112 Nausea with vomiting, unspecified: Secondary | ICD-10-CM | POA: Diagnosis not present

## 2017-05-09 DIAGNOSIS — F319 Bipolar disorder, unspecified: Secondary | ICD-10-CM | POA: Diagnosis present

## 2017-05-09 DIAGNOSIS — Z22322 Carrier or suspected carrier of Methicillin resistant Staphylococcus aureus: Secondary | ICD-10-CM

## 2017-05-09 DIAGNOSIS — Z885 Allergy status to narcotic agent status: Secondary | ICD-10-CM

## 2017-05-09 DIAGNOSIS — K5651 Intestinal adhesions [bands], with partial obstruction: Secondary | ICD-10-CM | POA: Diagnosis not present

## 2017-05-09 DIAGNOSIS — F310 Bipolar disorder, current episode hypomanic: Secondary | ICD-10-CM | POA: Diagnosis not present

## 2017-05-09 DIAGNOSIS — F17210 Nicotine dependence, cigarettes, uncomplicated: Secondary | ICD-10-CM | POA: Diagnosis present

## 2017-05-09 DIAGNOSIS — R52 Pain, unspecified: Secondary | ICD-10-CM

## 2017-05-09 LAB — COMPREHENSIVE METABOLIC PANEL
ALK PHOS: 93 U/L (ref 38–126)
ALT: 18 U/L (ref 14–54)
AST: 32 U/L (ref 15–41)
Albumin: 4.1 g/dL (ref 3.5–5.0)
Anion gap: 16 — ABNORMAL HIGH (ref 5–15)
BILIRUBIN TOTAL: 0.4 mg/dL (ref 0.3–1.2)
BUN: 6 mg/dL (ref 6–20)
CALCIUM: 9.7 mg/dL (ref 8.9–10.3)
CO2: 22 mmol/L (ref 22–32)
CREATININE: 0.52 mg/dL (ref 0.44–1.00)
Chloride: 107 mmol/L (ref 101–111)
GFR calc Af Amer: >60 mL/min (ref >60)
Glucose, Bld: 95 mg/dL (ref 65–99)
Potassium: 3 mmol/L — ABNORMAL LOW (ref 3.5–5.1)
Sodium: 145 mmol/L (ref 135–145)
TOTAL PROTEIN: 8.2 g/dL — AB (ref 6.5–8.1)

## 2017-05-09 LAB — CBC
HCT: 35.4 % — ABNORMAL LOW (ref 36.0–46.0)
Hemoglobin: 11.8 g/dL — ABNORMAL LOW (ref 12.0–15.0)
MCH: 26.5 pg (ref 26.0–34.0)
MCHC: 33.3 g/dL (ref 30.0–36.0)
MCV: 79.4 fL (ref 78.0–100.0)
PLATELETS: 396 10*3/uL (ref 150–400)
RBC: 4.46 MIL/uL (ref 3.87–5.11)
RDW: 19.6 % — AB (ref 11.5–15.5)
WBC: 4.5 10*3/uL (ref 4.0–10.5)

## 2017-05-09 NOTE — ED Notes (Signed)
Attempted IV 2 times without success

## 2017-05-09 NOTE — ED Triage Notes (Signed)
Patient reports rectal bleeding that started at 6 am. Patient describes it as bright burgundy with clots. Patient reports wound infection x 2 on her abdomen close to the umbilicus. Purulent drainage noted with foul odor. Patient states the wounds came up about 3 days ago.

## 2017-05-10 ENCOUNTER — Encounter (HOSPITAL_COMMUNITY): Payer: Self-pay | Admitting: Family Medicine

## 2017-05-10 ENCOUNTER — Other Ambulatory Visit: Payer: Self-pay

## 2017-05-10 ENCOUNTER — Emergency Department (HOSPITAL_COMMUNITY): Payer: Medicare Other

## 2017-05-10 DIAGNOSIS — K56609 Unspecified intestinal obstruction, unspecified as to partial versus complete obstruction: Secondary | ICD-10-CM | POA: Diagnosis not present

## 2017-05-10 DIAGNOSIS — I10 Essential (primary) hypertension: Secondary | ICD-10-CM

## 2017-05-10 DIAGNOSIS — F419 Anxiety disorder, unspecified: Secondary | ICD-10-CM | POA: Diagnosis present

## 2017-05-10 DIAGNOSIS — D638 Anemia in other chronic diseases classified elsewhere: Secondary | ICD-10-CM | POA: Diagnosis present

## 2017-05-10 DIAGNOSIS — E538 Deficiency of other specified B group vitamins: Secondary | ICD-10-CM | POA: Diagnosis present

## 2017-05-10 DIAGNOSIS — S31109D Unspecified open wound of abdominal wall, unspecified quadrant without penetration into peritoneal cavity, subsequent encounter: Secondary | ICD-10-CM | POA: Diagnosis not present

## 2017-05-10 DIAGNOSIS — D573 Sickle-cell trait: Secondary | ICD-10-CM | POA: Diagnosis present

## 2017-05-10 DIAGNOSIS — E876 Hypokalemia: Secondary | ICD-10-CM

## 2017-05-10 DIAGNOSIS — K632 Fistula of intestine: Secondary | ICD-10-CM | POA: Diagnosis present

## 2017-05-10 DIAGNOSIS — K625 Hemorrhage of anus and rectum: Secondary | ICD-10-CM

## 2017-05-10 DIAGNOSIS — Z4682 Encounter for fitting and adjustment of non-vascular catheter: Secondary | ICD-10-CM | POA: Diagnosis not present

## 2017-05-10 DIAGNOSIS — K449 Diaphragmatic hernia without obstruction or gangrene: Secondary | ICD-10-CM | POA: Diagnosis present

## 2017-05-10 DIAGNOSIS — K644 Residual hemorrhoidal skin tags: Secondary | ICD-10-CM | POA: Diagnosis present

## 2017-05-10 DIAGNOSIS — R079 Chest pain, unspecified: Secondary | ICD-10-CM | POA: Diagnosis not present

## 2017-05-10 DIAGNOSIS — R112 Nausea with vomiting, unspecified: Secondary | ICD-10-CM | POA: Diagnosis not present

## 2017-05-10 DIAGNOSIS — K921 Melena: Secondary | ICD-10-CM | POA: Diagnosis not present

## 2017-05-10 DIAGNOSIS — R2991 Unspecified symptoms and signs involving the musculoskeletal system: Secondary | ICD-10-CM | POA: Diagnosis not present

## 2017-05-10 DIAGNOSIS — G43A0 Cyclical vomiting, not intractable: Secondary | ICD-10-CM | POA: Diagnosis not present

## 2017-05-10 DIAGNOSIS — F17210 Nicotine dependence, cigarettes, uncomplicated: Secondary | ICD-10-CM | POA: Diagnosis present

## 2017-05-10 DIAGNOSIS — E039 Hypothyroidism, unspecified: Secondary | ICD-10-CM | POA: Diagnosis present

## 2017-05-10 DIAGNOSIS — L089 Local infection of the skin and subcutaneous tissue, unspecified: Secondary | ICD-10-CM | POA: Diagnosis not present

## 2017-05-10 DIAGNOSIS — S31109A Unspecified open wound of abdominal wall, unspecified quadrant without penetration into peritoneal cavity, initial encounter: Secondary | ICD-10-CM

## 2017-05-10 DIAGNOSIS — D649 Anemia, unspecified: Secondary | ICD-10-CM | POA: Diagnosis not present

## 2017-05-10 DIAGNOSIS — F329 Major depressive disorder, single episode, unspecified: Secondary | ICD-10-CM | POA: Diagnosis present

## 2017-05-10 DIAGNOSIS — R042 Hemoptysis: Secondary | ICD-10-CM | POA: Diagnosis not present

## 2017-05-10 DIAGNOSIS — K3184 Gastroparesis: Secondary | ICD-10-CM | POA: Diagnosis not present

## 2017-05-10 DIAGNOSIS — K5651 Intestinal adhesions [bands], with partial obstruction: Secondary | ICD-10-CM | POA: Diagnosis not present

## 2017-05-10 DIAGNOSIS — K922 Gastrointestinal hemorrhage, unspecified: Secondary | ICD-10-CM | POA: Diagnosis not present

## 2017-05-10 DIAGNOSIS — R05 Cough: Secondary | ICD-10-CM | POA: Diagnosis not present

## 2017-05-10 DIAGNOSIS — K648 Other hemorrhoids: Secondary | ICD-10-CM | POA: Diagnosis not present

## 2017-05-10 DIAGNOSIS — F310 Bipolar disorder, current episode hypomanic: Secondary | ICD-10-CM | POA: Diagnosis not present

## 2017-05-10 DIAGNOSIS — Z9884 Bariatric surgery status: Secondary | ICD-10-CM | POA: Diagnosis not present

## 2017-05-10 DIAGNOSIS — D62 Acute posthemorrhagic anemia: Secondary | ICD-10-CM | POA: Diagnosis not present

## 2017-05-10 DIAGNOSIS — F332 Major depressive disorder, recurrent severe without psychotic features: Secondary | ICD-10-CM | POA: Diagnosis not present

## 2017-05-10 DIAGNOSIS — G8929 Other chronic pain: Secondary | ICD-10-CM | POA: Diagnosis present

## 2017-05-10 DIAGNOSIS — K219 Gastro-esophageal reflux disease without esophagitis: Secondary | ICD-10-CM | POA: Diagnosis present

## 2017-05-10 DIAGNOSIS — Z79899 Other long term (current) drug therapy: Secondary | ICD-10-CM | POA: Diagnosis not present

## 2017-05-10 DIAGNOSIS — Z888 Allergy status to other drugs, medicaments and biological substances status: Secondary | ICD-10-CM | POA: Diagnosis not present

## 2017-05-10 LAB — CBC
HEMATOCRIT: 28.4 % — AB (ref 36.0–46.0)
HEMATOCRIT: 29.6 % — AB (ref 36.0–46.0)
HEMATOCRIT: 29 % — AB (ref 36.0–46.0)
HEMOGLOBIN: 9.6 g/dL — AB (ref 12.0–15.0)
HEMOGLOBIN: 10 g/dL — AB (ref 12.0–15.0)
Hemoglobin: 9.6 g/dL — ABNORMAL LOW (ref 12.0–15.0)
MCH: 26.8 pg (ref 26.0–34.0)
MCH: 26.8 pg (ref 26.0–34.0)
MCH: 26.7 pg (ref 26.0–34.0)
MCHC: 33.8 g/dL (ref 30.0–36.0)
MCHC: 33.8 g/dL (ref 30.0–36.0)
MCHC: 33.1 g/dL (ref 30.0–36.0)
MCV: 79.3 fL (ref 78.0–100.0)
MCV: 79.4 fL (ref 78.0–100.0)
MCV: 80.8 fL (ref 78.0–100.0)
PLATELETS: 308 10*3/uL (ref 150–400)
Platelets: 327 10*3/uL (ref 150–400)
Platelets: 321 10*3/uL (ref 150–400)
RBC: 3.58 MIL/uL — ABNORMAL LOW (ref 3.87–5.11)
RBC: 3.73 MIL/uL — AB (ref 3.87–5.11)
RBC: 3.59 MIL/uL — ABNORMAL LOW (ref 3.87–5.11)
RDW: 19.7 % — ABNORMAL HIGH (ref 11.5–15.5)
RDW: 19.5 % — ABNORMAL HIGH (ref 11.5–15.5)
RDW: 19.3 % — AB (ref 11.5–15.5)
WBC: 3.8 10*3/uL — AB (ref 4.0–10.5)
WBC: 4 10*3/uL (ref 4.0–10.5)
WBC: 4.2 10*3/uL (ref 4.0–10.5)

## 2017-05-10 LAB — RETICULOCYTES
RBC.: 3.56 MIL/uL — ABNORMAL LOW (ref 3.87–5.11)
Retic Count, Absolute: 42.7 10*3/uL (ref 19.0–186.0)
Retic Ct Pct: 1.2 % (ref 0.4–3.1)

## 2017-05-10 LAB — BASIC METABOLIC PANEL
ANION GAP: 11 (ref 5–15)
BUN: 8 mg/dL (ref 6–20)
CALCIUM: 7.9 mg/dL — AB (ref 8.9–10.3)
CHLORIDE: 109 mmol/L (ref 101–111)
CO2: 19 mmol/L — AB (ref 22–32)
Creatinine, Ser: 0.52 mg/dL (ref 0.44–1.00)
GFR calc Af Amer: >60 mL/min (ref >60)
GFR calc non Af Amer: >60 mL/min (ref >60)
Glucose, Bld: 89 mg/dL (ref 65–99)
POTASSIUM: 3.2 mmol/L — AB (ref 3.5–5.1)
Sodium: 139 mmol/L (ref 135–145)

## 2017-05-10 LAB — IRON AND TIBC
IRON: 32 ug/dL (ref 28–170)
SATURATION RATIOS: 9 % — AB (ref 10.4–31.8)
TIBC: 340 ug/dL (ref 250–450)
UIBC: 308 ug/dL

## 2017-05-10 LAB — FERRITIN: Ferritin: 23 ng/mL (ref 11–307)

## 2017-05-10 LAB — FOLATE: FOLATE: 3.1 ng/mL — AB (ref >5.9)

## 2017-05-10 LAB — MAGNESIUM: Magnesium: 1.5 mg/dL — ABNORMAL LOW (ref 1.7–2.4)

## 2017-05-10 LAB — TYPE AND SCREEN
ABO/RH(D): O POS
ANTIBODY SCREEN: NEGATIVE

## 2017-05-10 LAB — VITAMIN B12: Vitamin B-12: 376 pg/mL (ref 180–914)

## 2017-05-10 MED ORDER — ONDANSETRON HCL 4 MG PO TABS: 4.0000 mg | ORAL_TABLET | Freq: Four times a day (QID) | ORAL | Status: DC | PRN

## 2017-05-10 MED ORDER — HYDRALAZINE HCL 20 MG/ML IJ SOLN: 10.0000 mg | INTRAMUSCULAR | Status: DC | PRN

## 2017-05-10 MED ORDER — ONDANSETRON 8 MG PO TBDP
8.0000 mg | ORAL_TABLET | Freq: Once | ORAL | Status: AC
  Administered 2017-05-10: 8 mg via ORAL
  Filled 2017-05-10: qty 1

## 2017-05-10 MED ORDER — ESCITALOPRAM OXALATE 10 MG PO TABS
20.0000 mg | ORAL_TABLET | Freq: Every day | ORAL | Status: DC
  Administered 2017-05-10 – 2017-05-17 (×7): 20 mg via ORAL
  Filled 2017-05-10 (×10): qty 2

## 2017-05-10 MED ORDER — PROMETHAZINE HCL 25 MG/ML IJ SOLN
12.5000 mg | Freq: Four times a day (QID) | INTRAMUSCULAR | Status: DC | PRN
  Administered 2017-05-10 – 2017-05-11 (×2): 12.5 mg via INTRAVENOUS
  Administered 2017-05-11 – 2017-05-13 (×7): 25 mg via INTRAVENOUS
  Administered 2017-05-13: 12.5 mg via INTRAVENOUS
  Administered 2017-05-14: 25 mg via INTRAVENOUS
  Filled 2017-05-10 (×11): qty 1

## 2017-05-10 MED ORDER — SODIUM CHLORIDE 0.9 % IV BOLUS (SEPSIS)
500.0000 mL | Freq: Once | INTRAVENOUS | Status: AC
  Administered 2017-05-10: 500 mL via INTRAVENOUS

## 2017-05-10 MED ORDER — SODIUM CHLORIDE 0.9 % IV SOLN: INTRAVENOUS | Status: DC

## 2017-05-10 MED ORDER — ONDANSETRON HCL 4 MG/2ML IJ SOLN
4.0000 mg | Freq: Four times a day (QID) | INTRAMUSCULAR | Status: DC | PRN
  Administered 2017-05-10 – 2017-05-11 (×3): 4 mg via INTRAVENOUS
  Filled 2017-05-10 (×3): qty 2

## 2017-05-10 MED ORDER — CYCLOBENZAPRINE HCL 10 MG PO TABS
10.0000 mg | ORAL_TABLET | Freq: Three times a day (TID) | ORAL | Status: DC | PRN
  Administered 2017-05-13 – 2017-05-21 (×5): 10 mg via ORAL
  Filled 2017-05-10 (×6): qty 1

## 2017-05-10 MED ORDER — MAGNESIUM SULFATE 4 GM/100ML IV SOLN
4.0000 g | Freq: Once | INTRAVENOUS | Status: AC
  Administered 2017-05-10: 4 g via INTRAVENOUS
  Filled 2017-05-10: qty 100

## 2017-05-10 MED ORDER — POTASSIUM CHLORIDE CRYS ER 20 MEQ PO TBCR
40.0000 meq | EXTENDED_RELEASE_TABLET | ORAL | Status: AC
  Administered 2017-05-10 (×2): 40 meq via ORAL
  Filled 2017-05-10 (×2): qty 2

## 2017-05-10 MED ORDER — POTASSIUM CHLORIDE IN NACL 40-0.9 MEQ/L-% IV SOLN
INTRAVENOUS | Status: DC
  Administered 2017-05-10: 115 mL/h via INTRAVENOUS
  Filled 2017-05-10 (×2): qty 1000

## 2017-05-10 MED ORDER — POTASSIUM CHLORIDE IN NACL 40-0.9 MEQ/L-% IV SOLN: INTRAVENOUS | Status: DC

## 2017-05-10 MED ORDER — SODIUM CHLORIDE 0.9 % IV SOLN
INTRAVENOUS | Status: AC
  Administered 2017-05-10 – 2017-05-11 (×2): via INTRAVENOUS
  Filled 2017-05-10 (×6): qty 1000

## 2017-05-10 MED ORDER — SODIUM CHLORIDE 0.9 % IV BOLUS (SEPSIS)
30.0000 mL/kg | Freq: Once | INTRAVENOUS | Status: AC
  Administered 2017-05-10: 1851 mL via INTRAVENOUS

## 2017-05-10 MED ORDER — PANTOPRAZOLE SODIUM 40 MG IV SOLR
40.0000 mg | Freq: Two times a day (BID) | INTRAVENOUS | Status: DC
Start: 2017-05-10 — End: 2017-05-13
  Administered 2017-05-10 – 2017-05-13 (×6): 40 mg via INTRAVENOUS
  Filled 2017-05-10 (×9): qty 40

## 2017-05-10 MED ORDER — ACETAMINOPHEN 650 MG RE SUPP: 650.0000 mg | Freq: Four times a day (QID) | RECTAL | Status: DC | PRN

## 2017-05-10 MED ORDER — ACETAMINOPHEN 325 MG PO TABS
650.0000 mg | ORAL_TABLET | Freq: Four times a day (QID) | ORAL | Status: DC | PRN
  Administered 2017-05-11 – 2017-05-16 (×3): 650 mg via ORAL
  Filled 2017-05-10 (×3): qty 2

## 2017-05-10 MED ORDER — ZOLPIDEM TARTRATE 5 MG PO TABS
5.0000 mg | ORAL_TABLET | Freq: Every evening | ORAL | Status: DC | PRN
  Administered 2017-05-12: 5 mg via ORAL
  Filled 2017-05-10: qty 1

## 2017-05-10 MED ORDER — SODIUM CHLORIDE 0.9 % IV SOLN
INTRAVENOUS | Status: DC
  Administered 2017-05-10: 22:00:00 via INTRAVENOUS

## 2017-05-10 MED ORDER — FENTANYL CITRATE (PF) 100 MCG/2ML IJ SOLN
25.0000 ug | INTRAMUSCULAR | Status: DC | PRN
  Administered 2017-05-10 – 2017-05-15 (×35): 50 ug via INTRAVENOUS
  Filled 2017-05-10 (×36): qty 2

## 2017-05-10 NOTE — Progress Notes (Signed)
I have seen and assessed patient and agree with Dr. Criss Rosales assessment and plan.  Patient is a 52 year old female history of major depressive disorder, history of gastric bypass, chronic periumbilical fistulas, hypertension presented to the ED with rectal bleeding which she describes as a burgundy color.  Patient with complaints of generalized weakness.  Patient denies any NSAID use.  Patient on PPIs daily.  Patient seen in the ED hemoglobin at 9.1 from 11.8, presentation to the ED.  Patient states had 4 more burgundy colored stools, while in the ED.  Patient currently n.p.o.  Electrolytes being repleted.  GI consultation pending.  Serial CBCs.   No charge.Marland Kitchen

## 2017-05-10 NOTE — ED Notes (Signed)
Went in to round on patient. Pt asleep. Chest rise and fall symmetrical. nad noted.

## 2017-05-10 NOTE — Consult Note (Signed)
Referring Provider: No ref. provider found Primary Care Physician:  Rosita Fire, MD Primary Gastroenterologist:  Dr. Oneida Alar  Reason for Consultation:  GI bleed  HPI:  52 year old lady with history of chronic abdominal pain, recurrent abdominal wall fistula and abscesses along with polysubstance abuse being admitted through the emergency department with burgundy/grossly bloody stools intermittently since yesterday morning.  She remains hemodynamically stable. Some nausea and nonbloody emesis. Symptoms started acutely. Denies melena.  In the ED now for over 24 hours given bed situation. Only scant amount of old blood and clot noted in BMs.  History of colonoscopy about 4 months ago revealing of significant diverticulosis and hemorrhoids -  the latter felt be possibly contributing to her rectal bleeding. 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  H&H this morning 9.6 and 28.4;  11.8 / 35.4 on admission yesterday.  H&H 3 months ago 9.1 and 27.2-patient denies recent transfusion.  Noncontrast CT demonstrated some chronic inflammatory changes in the right abdominal wall. Mild extrahepatic biliary dilation. Stomach findings consistent with prior surgery..  Naprosyn is listed as one of her medications although she denies taking any nonsteroidals or any form of lately.  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  History of chronic abdominal pain related to multiple abdominal wall fistula and abscesses. She underwent I&D by Dr. Arnoldo Morale last year. She states she has had a new fistula with drainage come up since she last saw Dr. Arnoldo Morale.  History of polysubstance abuse including alcohol and cocaine has complicated her management previously. Of note, her urine drug screen is negative this admission.  She denies dysphagia or melena.  Small bowel video capsule  study contemplated previously but patency capsule results appear to have been inconclusive.  Past Medical History:  Diagnosis Date  . Abscess    soft tissue  . Adrenal mass ([place])   . Alcohol abuse   . Anxiety   . Blood transfusion without reported diagnosis   . Chronic abdominal pain   . Chronic wound infection of abdomen   . Colon polyp    colonoscopy 04/2014  . Depression   . Diverticulosis    colonoscopy 04/2014 moderat pan colonic  . Gastritis    EGD 05/2014  . Gastroparesis Nov 2015  . GERD (gastroesophageal reflux disease)   . Hemorrhoid    internal large  . Hiatal hernia   . History of Billroth II operation   . Hypertension   . Lung nodule    CT 02/2014 needs repeat 1 month  . Lung nodule < 6cm on CT 04/25/2014  . Lupus   . Nausea and vomiting    chronic, recurrent  . Pancreatitis   . Schatzki's ring    patent per EGD 04/2014  . Sickle cell trait ([place])   . Suicide attempt ([place])   . 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 ABDOMINAL HYSTERECTOMY  2013   [place]  . ABDOMINAL SURGERY    . ADRENALECTOMY Right   . AGILE CAPSULE N/A 01/05/2015   Procedure: AGILE CAPSULE;  Surgeon: Daneil Dolin, MD;  [facility]: [facility];  Service: Endoscopy;  Laterality: N/A;  0700  . Billroth II procedure      [place], first 2000, 2005/2006.  Marland Kitchen BIOPSY  05/20/2013   Procedure: BIOPSIES OF ASCENDING AND SIGMOID COLON;  Surgeon: Daneil Dolin, MD;  Location: [facility];  Service: Endoscopy;;  . BIOPSY  04/26/2014   Procedure: BIOPSIES;  Surgeon: Danie Binder, MD;  Location: [facility];  Service: Endoscopy;;  . CHOLECYSTECTOMY    . COLONOSCOPY     in [place]  . COLONOSCOPY WITH PROPOFOL N/A 05/20/2013   Dr.Marianny Goris- inadequate prep, normal appearing rectum, grossly normal colon aside from pancolonic diverticula, normal terminal ileum bx= unremarkable colonic mucosa. Due for early interval 2016.   Marland Kitchen COLONOSCOPY WITH PROPOFOL N/A  04/26/2014   QPY:PPJKDT ileum/one colon polyp removed/moderate pan-colonic diverticulosis/large internal hemorrhoids  . COLONOSCOPY WITH PROPOFOL N/A 12/23/2014   Dr.Rhodesia Stanger- minimal internal hemorrhoids, pancolonic diverticulosis  . COLONOSCOPY WITH PROPOFOL N/A 08/23/2016   Procedure: COLONOSCOPY WITH PROPOFOL;  Surgeon: Danie Binder, MD;  [facility];  Service: Endoscopy;  Laterality: N/A;  . DEBRIDEMENT OF ABDOMINAL WALL ABSCESS N/A 02/08/2013   Procedure: DEBRIDEMENT OF ABDOMINAL WALL ABSCESS;  Surgeon: Jamesetta So, MD;  Location: [facility];  Service: General;  Laterality: N/A;  . ESOPHAGOGASTRODUODENOSCOPY (EGD) WITH PROPOFOL N/A 05/20/2013   Dr.Jashan Cotten- s/p prior gastric surgery with normal esophagus, residual gastric mucosa and patent efferent limb  . ESOPHAGOGASTRODUODENOSCOPY (EGD) WITH PROPOFOL N/A 02/03/2014   Dr. Gala Romney:  s/p hemigastrectomy with retained gastric contents. Residual gastric mucosa and efferent limb appeared normal otherwise. Query gastroparesis.   Marland Kitchen ESOPHAGOGASTRODUODENOSCOPY (EGD) WITH PROPOFOL N/A 04/26/2014   OIZ:[id number] ring/small HH/mild non-erosive gasrtitis/normal anastomosis  . ESOPHAGOGASTRODUODENOSCOPY (EGD) WITH PROPOFOL N/A 12/23/2014   Dr.Burnett Lieber- s/p prior hemigastrctomy, active oozing from anastomotic suture site, hemostasis achieved  . ESOPHAGOGASTRODUODENOSCOPY (EGD) WITH PROPOFOL N/A 05/23/2016   Dr. 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 INCISION AND DRAINAGE ABSCESS N/A 01/06/2017   Procedure: INCISION AND DRAINAGE ABDOMINAL WALL ABSCESS;  Surgeon: Aviva Signs, MD;  Location: [facility];  Service: General;  Laterality: N/A;  . tendon repar Right    wrist  . WOUND EXPLORATION Right 06/24/2013   Procedure: exploration of traumatic wound right wrist;  Surgeon: Tennis Must, MD;  Location: [facility];  Service: Orthopedics;  Laterality: Right;    Prior to  Admission medications   Medication Sig Start Date End Date Taking? Authorizing Provider  amLODipine (NORVASC) 10 MG tablet Take 0.5 tablets (5 mg total) by mouth daily. 01/03/17  Yes Patrecia Pour, Christean Grief, MD  Cyanocobalamin (VITAMIN B-12) 2500 MCG TABS Take 5,000 mcg by mouth daily. 11/26/16  Yes Reyne Dumas, MD  escitalopram (LEXAPRO) 20 MG tablet Take 20 mg daily by mouth.   Yes [provider]  fluticasone (VERAMYST) 27.5 MCG/SPRAY nasal spray Place 2 sprays daily as needed into the nose.   Yes [provider]  lisinopril (PRINIVIL,ZESTRIL) 20 MG tablet Take 1 tablet (20 mg total) by mouth daily. 11/26/16 11/26/17 Yes Reyne Dumas, MD  naproxen (NAPROSYN) 500 MG tablet Take 1 tablet (500 mg total) by mouth 2 (two) times daily with a meal. 04/26/17  Yes Triplett, Tammy, PA-C  ondansetron (ZOFRAN ODT) 4 MG disintegrating tablet Take 1 tablet (4 mg total) every 8 (eight) hours as needed by mouth for nausea or vomiting. 01/25/17  Yes Long, Wonda Olds, MD  pantoprazole (PROTONIX) 20 MG tablet Take 1 tablet (20 mg total) by mouth daily. 09/23/16  Yes Francine Graven, DO  zolpidem (AMBIEN) 5 MG tablet Take 5 mg by mouth at bedtime as needed for sleep.   Yes [provider]  cyclobenzaprine (FLEXERIL) 10 MG tablet Take 1 tablet (10 mg total) by mouth 3 (three) times daily as needed. 04/26/17   Triplett, Tammy, PA-C    Current Facility-Administered Medications  Medication Dose Route Frequency Provider Last Rate Last Dose  . acetaminophen (TYLENOL) tablet 650 mg  650 mg Oral Q6H PRN Opyd, Ilene Qua, MD       Or  . acetaminophen (TYLENOL) suppository 650 mg  650 mg Rectal Q6H PRN Opyd, Ilene Qua, MD      . cyclobenzaprine (FLEXERIL) tablet 10 mg  10 mg Oral TID PRN Opyd, Ilene Qua, MD      . escitalopram (LEXAPRO) tablet 20 mg  20 mg Oral Daily Opyd, Ilene Qua, MD      . fentaNYL (SUBLIMAZE) injection 25-50 mcg  25-50 mcg Intravenous Q2H PRN Opyd, Ilene Qua, MD   50 mcg at 05/10/17  0949  . hydrALAZINE (APRESOLINE) injection 10 mg  10 mg Intravenous Q4H PRN Opyd, Ilene Qua, MD      . magnesium sulfate IVPB 4 g 100 mL  4 g Intravenous Once Eugenie Filler, MD 50 mL/hr at 05/10/17 0946 4 g at 05/10/17 0946  . ondansetron (ZOFRAN) tablet 4 mg  4 mg Oral Q6H PRN Opyd, Ilene Qua, MD       Or  . ondansetron (ZOFRAN) injection 4 mg  4 mg Intravenous Q6H PRN Opyd, Ilene Qua, MD   4 mg at 05/10/17 0949  . pantoprazole (PROTONIX) injection 40 mg  40 mg Intravenous Q12H Opyd, Timothy S, MD      . potassium chloride SA (K-DUR,KLOR-CON) CR tablet 40 mEq  40 mEq Oral Q4H Eugenie Filler, MD   40 mEq at 05/10/17 1039  . sodium chloride 0.9 % 1,000 mL infusion   Intravenous Continuous Eugenie Filler, MD      . zolpidem (AMBIEN) tablet 5 mg  5 mg Oral QHS PRN Opyd, Ilene Qua, MD       Current Outpatient Medications  Medication Sig Dispense Refill  . amLODipine (NORVASC) 10 MG tablet Take 0.5 tablets (5 mg total) by mouth daily. 30 tablet 0  . Cyanocobalamin (VITAMIN B-12) 2500 MCG TABS Take 5,000 mcg by mouth daily. 120 tablet 2  . escitalopram (LEXAPRO) 20 MG tablet Take 20 mg daily by mouth.    . fluticasone (VERAMYST) 27.5 MCG/SPRAY nasal spray Place 2 sprays daily as needed into the nose.    Marland Kitchen lisinopril (PRINIVIL,ZESTRIL) 20 MG tablet Take 1 tablet (20 mg total) by mouth daily. 30 tablet 11  . naproxen (NAPROSYN) 500 MG tablet Take 1 tablet (500 mg total) by mouth 2 (two) times daily with a meal. 20 tablet 0  . ondansetron (ZOFRAN ODT) 4 MG disintegrating tablet Take 1 tablet (4 mg total) every 8 (eight) hours as needed by mouth for nausea or vomiting. 20 tablet 0  . pantoprazole (PROTONIX) 20 MG tablet Take 1 tablet (20 mg total) by mouth daily. 15 tablet 0  . zolpidem (AMBIEN) 5 MG tablet Take 5 mg by mouth at bedtime as needed for sleep.    . cyclobenzaprine (FLEXERIL) 10 MG tablet Take 1 tablet (10 mg total) by mouth 3 (three) times daily as needed. 21 tablet 0     Allergies as of 05/09/2017 - Review Complete 05/09/2017  Allergen Reaction Noted  . Codeine Hives 02/06/2013  . Morphine and related Hives 02/06/2013  . Reglan [metoclopramide] Other (See Comments) 08/08/2016    Family History  Problem  Relation Age of Onset  . Brain cancer Son   . Schizophrenia Son   . Cancer Son        brain  . Lung cancer Father   . Cancer Father        mets  . Drug abuse Mother   . Breast cancer Maternal Aunt   . Bipolar disorder Maternal Aunt   . Drug abuse Maternal Aunt   . Colon cancer Maternal Grandmother        late 70s, early 65s  . Drug abuse Sister   . Drug abuse Brother   . Bipolar disorder Paternal Grandfather   . Bipolar disorder Cousin   . Liver disease Neg Hx     Social History   Socioeconomic History  . Marital status: Legally Separated    Spouse name: Not on file  . Number of children: Not on file  . Years of education: Not on file  . Highest education level: Not on file  Social Needs  . Financial resource strain: Not on file  . Food insecurity - worry: Not on file  . Food insecurity - inability: Not on file  . Transportation needs - medical: Not on file  . Transportation needs - non-medical: Not on file  Occupational History  . Not on file  Tobacco Use  . Smoking status: Current Every Day Smoker    Packs/day: 0.50    Years: 35.00    Pack years: 17.50    Types: Cigarettes  . Smokeless tobacco: Never Used  Substance and Sexual Activity  . Alcohol use: No    Alcohol/week: 0.0 oz    Comment: 1 beer 4 days ago  . Drug use: No    Comment: quit 09/2012  . Sexual activity: Yes    Birth control/protection: Surgical  Other Topics Concern  . Not on file  Social History Narrative  . Not on file    Review of Systems: As in history of present illness   Physical Exam: Vital signs in last 24 hours: Temp:  [98.7 F (37.1 C)-98.9 F (37.2 C)] 98.7 F (37.1 C) (02/23 0643) Pulse Rate:  [89-114] 89 (02/23 0945) Resp:   [16-18] 18 (02/23 0945) BP: (116-150)/(74-112) 116/96 (02/23 0945) SpO2:  [96 %-99 %] 96 % (02/23 0945) Weight:  [136 lb (61.7 kg)] 136 lb (61.7 kg) (02/22 2120)    Patient seen in ED room 1 General:   Alert,   pleasant and cooperative in NAD.  Multiple tattoos. Neck:  Supple; no masses or thyromegaly. Lungs:  Clear throughout to auscultation.   No wheezes, crackles, or rhonchi. No acute distress. Heart:  Regular rate and rhythm; no murmurs, clicks, rubs,  or gallops. Abdomen:  Multiple dressings in place on her abdominal wall. Multiple scars. She does have what appears to be at least one draining area in the right mid abdomen. She is mildly diffusely tender.   Intake/Output from previous day: 02/22 0701 - 02/23 0700 In: 500 [IV Piggyback:500] Out: -  Intake/Output this shift: No intake/output data recorded.  Lab Results: Recent Labs    05/09/17 2225 05/10/17 0616  WBC 4.5 3.8*  HGB 11.8* 9.6*  HCT 35.4* 28.4*  PLT 396 327   BMET Recent Labs    05/09/17 2225 05/10/17 0616  NA 145 139  K 3.0* 3.2*  CL 107 109  CO2 22 19*  GLUCOSE 95 89  BUN 6 8  CREATININE 0.52 0.52  CALCIUM 9.7 7.9*   LFT Recent Labs  05/09/17 2225  PROT 8.2*  ALBUMIN 4.1  AST 32  ALT 18  ALKPHOS 93  BILITOT 0.4   PT/INR No results for input(s): LABPROT, INR in the last 72 hours. Hepatitis Panel No results for input(s): HEPBSAG, HCVAB, HEPAIGM, HEPBIGM in the last 72 hours. C-Diff No results for input(s): CDIFFTOX in the last 72 hours.  Studies/Results: Ct Abdomen Pelvis Wo Contrast  Result Date: 05/10/2017 CLINICAL DATA:  Rectal bleeding with cutaneous fistula to the right of umbilicus EXAM: CT ABDOMEN AND PELVIS WITHOUT CONTRAST TECHNIQUE: Multidetector CT imaging of the abdomen and pelvis was performed following the standard protocol without IV contrast. COMPARISON:  CT 01/04/2017, 01/02/2017, 11/25/2016, 09/23/2016, 05/22/2016, 04/25/2016 FINDINGS: Lower chest: Lung bases  demonstrate no acute consolidation or pleural effusion. Normal heart size. Coronary vascular calcification. Surgical clips around the GE junction Hepatobiliary: No focal hepatic abnormality. Surgical clips at the gallbladder fossa. Stable enlarged extrahepatic bile duct. Pancreas: Unremarkable. No pancreatic ductal dilatation or surrounding inflammatory changes. Spleen: Normal in size without focal abnormality. Adrenals/Urinary Tract: Stable 2 cm left adrenal gland mass. The right adrenal gland is not seen. Kidneys show no hydronephrosis. The bladder is unremarkable Stomach/Bowel: The stomach is nonenlarged. Postsurgical changes of the stomach. No dilated small bowel. Surgical suture in the right lower quadrant small bowel with mild wall thickening here. Diffuse collapsed appearance of the colon. No rectal soft tissue thickening or surrounding inflammation. Vascular/Lymphatic: Moderate aortic atherosclerosis. No aneurysmal dilatation. No significantly enlarged lymph nodes Reproductive: Status post hysterectomy. No adnexal masses. Other: Negative for free air or free fluid. Irregular thick-walled rim enhancing fluid collection within the deep subcutaneous soft tissues of the midline abdominal wall abutting the rectus sheath and musculature. This is contiguous with linear tracts leading to the skin surface and are consistent with fistulous. Decreased phlegmonous change in the right subcutaneous soft tissues near the umbilical region but now with tract leading to the skin consistent with fistula. Musculoskeletal: No acute or significant osseous findings. IMPRESSION: 1. No CT evidence for acute intra-abdominal or pelvic abnormality. Extensive postsurgical changes. No rectal wall thickening or perirectal inflammation 2. Chronic inflammatory process within the periumbilical region of the ventral abdominal wall with multiple cutaneous fistulous, the more inferior of which appear chronic. A fistula has developed in the  region of previously noted right phlegmonous change to the right of the umbilicus. Electronically Signed   By: Donavan Foil M.D.   On: 05/10/2017 03:00    Impression:  Pleasant 52 year old lady with chronic abdominal pain the setting of multiple abdominal wall fistulas and abscesses presents with acute onset, low volume rectal bleeding. She remains hemodynamically stable but has had a notable drop her hemoglobin over the past 24 hours.  However, current hemoglobin is not that different than what it was 3 months ago.  She remains quite stable. History of complicated peptic ulcer disease in the past requiring hemigastrectomy. She denies NSAIDs. History of polysubstance abuse.  History of actively bleeding Dieulofay in the stomach treated endoscopically last year.  She is up-to-date on colonoscopy with findings  of significant diverticulosis and hemorrhoids.  She is hypokalemic.  Etiology of the bleeding is not well-defined at this time. Could be diverticula or hemorrhoidal in origin. Workup last year for similar presentation demonstrated active bleeding in the stomach.  I suspect abdominal pain has nothing to do with bleeding.   Recommendations:   As discussed with Dr. Grandville Silos earlier today. Limit diet to clear liquids at this time. Agree with PPI empirically. Replete potassium.  Recommend initiating evaluation with EGD tomorrow morning. Will utilize propofol.   The risks, benefits, limitations, alternatives and imponderables have been reviewed with the patient. Potential for esophageal dilation, biopsy, etc. have also been reviewed.   Questions have been answered. All parties agreeable.  At some point during this hospitalization, would ask Dr. Arnoldo Morale to reassess abdominal wall findings.   Further recommendations to follow.     Notice:  This dictation was prepared with Dragon dictation along with smaller phrase technology. Any transcriptional errors that result from this process are  unintentional and may not be corrected upon review.

## 2017-05-10 NOTE — ED Notes (Signed)
Pt c/o abdominal pain and nausea.  Rates pain a 9/10.  Lungs clear.  2 dressings to abdomen that are D and I.  Pt no visible distress.

## 2017-05-10 NOTE — ED Notes (Signed)
Pt states she feels better.

## 2017-05-10 NOTE — ED Provider Notes (Signed)
[facility] EMERGENCY DEPARTMENT Provider Note   CSN: [id number] Arrival date & time: 05/09/17  2109  Time seen 23:50 PM    History   Chief Complaint Chief Complaint  Patient presents with  . Abdominal Pain  . Wound Infection    HPI Angel Price is a 52 y.o. female.  HPI patient states she started having rectal bleeding about 6 AM this morning, she states she has had 20 episodes that she describes as red that is both liquid and with clots.  She also states about the same time she started having abdominal pain near her umbilicus.  She states she had several episodes of rectal bleeding last year and does not know the cause.  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  She states that they drained yellow pus.  She states a new "hole" popped up about 5 days ago and it is draining more than the other 2 sites.  She is not aware of any fever.  She states last time she was on antibiotics was in October when she was on doxycycline however she did not feel like that helped with the drainage from these areas.  She states she has had nausea and vomiting today at least 30 times.  She denies any blood in the vomitus.  She denies fever or feeling dizzy but states she does feel lightheaded.  PCP Rosita Fire, MD GI Dr Oneida Alar  Past Medical History:  Diagnosis Date  . Abscess    soft tissue  . Adrenal mass ([place])   . Alcohol abuse   . Anxiety   . Blood transfusion without reported diagnosis   . Chronic abdominal pain   . Chronic wound infection of abdomen   . Colon polyp    colonoscopy 04/2014  . Depression   . Diverticulosis    colonoscopy 04/2014 moderat pan colonic  . Gastritis    EGD 05/2014  . Gastroparesis Nov 2015  . GERD (gastroesophageal reflux disease)   . Hemorrhoid    internal large  . Hiatal hernia   . History of Billroth II operation   . Hypertension   . Lung nodule    CT  02/2014 needs repeat 1 month  . Lung nodule < 6cm on CT 04/25/2014  . Lupus   . Nausea and vomiting    chronic, recurrent  . Pancreatitis   . Schatzki's ring    patent per EGD 04/2014  . Sickle cell trait ([place])   . Suicide attempt ([place])   . 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 Cellulitis of abdominal wall 01/02/2017  . Acute left hemiparesis ([place]) 12/05/2016  . Malingering 12/05/2016  . Weakness of left lower extremity 12/02/2016  . CVA (cerebral vascular accident) ([place]) 11/23/2016  . Bright red rectal bleeding 08/22/2016  . Hypotension due to blood loss   . Acute GI bleeding 05/24/2016  . Dieulafoy lesion of duodenum   . History of Billroth II operation   . Gastrointestinal hemorrhage 05/22/2016  . Absolute anemia   . Acute pancreatitis 10/01/2015  . Abnormal CT scan of lung 10/01/2015  . Alcohol intoxication ([place])   . Left-sided weakness   . Psychosomatic factor in physical condition   . Upper GI bleed   . Diverticulosis of colon with hemorrhage   . Chronic wound infection of abdomen 12/22/2014  .  Thyroid disease 12/22/2014  . Ataxia 11/01/2014  . Hemorrhoids 04/26/2014  . Lung nodule 04/26/2014  . Diverticulosis   . Gastritis   . Hiatal hernia   . Schatzki's ring   . Acute blood loss anemia 04/25/2014  . Sinus tachycardia 04/25/2014  . Hypokalemia 04/25/2014  . Hematemesis with nausea 04/25/2014  . Lung nodule < 6cm on CT 04/25/2014  . Intractable nausea and vomiting 04/24/2014  . Gastroparesis   . Abdominal pain 04/01/2014  . Chronic abdominal pain 01/21/2014  . Gastroenteritis 12/10/2013  . Chronic abdominal wound infection 08/16/2013  . MDD (major depressive disorder), recurrent episode, severe ([place]) 06/27/2013  . Wrist laceration 06/24/2013  . Diarrhea 05/07/2013  . Rectal bleeding 05/07/2013  . Abnormal LFTs 05/07/2013  . Adrenal mass, left ([place]) 05/07/2013  . Abdominal wall abscess at  site of surgical wound 04/19/2013  . Abdominal wall abscess 04/18/2013  . Frequent headaches 03/30/2013  . Sleep difficulties 03/30/2013  . Essential hypertension, benign 03/30/2013  . History of cocaine abuse 03/16/2013  . History of schizoaffective disorder 03/16/2013  . Bipolar disorder ([place]) 03/16/2013  . Personality disorder ([place]) 03/16/2013  . Tobacco abuse 03/16/2013  . Alcohol abuse 03/16/2013  . Palpitations 03/16/2013  . Poor vision 03/16/2013  . History of gastric bypass 03/16/2013  . Status post hysterectomy with oophorectomy 03/16/2013  . Hypothyroid 03/16/2013  . Lupus ([place]) 03/16/2013    Past Surgical History:  Procedure Laterality Date  . ABDOMINAL HYSTERECTOMY  2013   [place]  . ABDOMINAL SURGERY    . ADRENALECTOMY Right   . AGILE CAPSULE N/A 01/05/2015   Procedure: AGILE CAPSULE;  Surgeon: Daneil Dolin, MD;  Location: [facility] ENDO SUITE;  Service: Endoscopy;  Laterality: N/A;  0700  . Billroth II procedure      [place], first 2000, 2005/2006.  Marland Kitchen BIOPSY  05/20/2013   Procedure: BIOPSIES OF ASCENDING AND SIGMOID COLON;  Surgeon: Daneil Dolin, MD;  Location: [facility] ORS;  Service: Endoscopy;;  . BIOPSY  04/26/2014   Procedure: BIOPSIES;  Surgeon: Danie Binder, MD;  Location: [facility] ORS;  Service: Endoscopy;;  . CHOLECYSTECTOMY    . COLONOSCOPY     in [place]  . COLONOSCOPY WITH PROPOFOL N/A 05/20/2013   Dr.Rourk- inadequate prep, normal appearing rectum, grossly normal colon aside from pancolonic diverticula, normal terminal ileum bx= unremarkable colonic mucosa. Due for early interval 2016.   Marland Kitchen COLONOSCOPY WITH PROPOFOL N/A 04/26/2014   ONG:EXBMWU ileum/one colon polyp removed/moderate pan-colonic diverticulosis/large internal hemorrhoids  . COLONOSCOPY WITH PROPOFOL N/A 12/23/2014   Dr.Rourk- minimal internal hemorrhoids, pancolonic diverticulosis  . COLONOSCOPY WITH PROPOFOL N/A 08/23/2016   Procedure: COLONOSCOPY WITH PROPOFOL;  Surgeon: Danie Binder, MD;  Location:  [facility] END[facility];  Service: Endoscopy;  Laterality: N/A;  . DEBRIDEMENT OF ABDOMINAL WALL ABSCESS N/A 02/08/2013   Procedure: DEBRIDEMENT OF ABDOMINAL WALL ABSCESS;  Surgeon: Jamesetta So, MD;  Location: [facility] ORS;  Service: General;  Laterality: N/A;  . ESOPHAGOGASTRODUODENOSCOPY (EGD) WITH PROPOFOL N/A 05/20/2013   Dr.Rourk- s/p prior gastric surgery with normal esophagus, residual gastric mucosa and patent efferent limb  . ESOPHAGOGASTRODUODENOSCOPY (EGD) WITH PROPOFOL N/A 02/03/2014   Dr. Gala Romney:  s/p hemigastrectomy with retained gastric contents. Residual gastric mucosa and efferent limb appeared normal otherwise. Query gastroparesis.   Marland Kitchen ESOPHAGOGASTRODUODENOSCOPY (EGD) WITH PROPOFOL N/A 04/26/2014   XLK:GMWNUUVO'Z ring/small HH/mild non-erosive gasrtitis/normal anastomosis  . ESOPHAGOGASTRODUODENOSCOPY (EGD) WITH PROPOFOL N/A 12/23/2014   Dr.Rourk- s/p prior hemigastrctomy, active oozing from anastomotic suture site, hemostasis  achieved  . ESOPHAGOGASTRODUODENOSCOPY (EGD) WITH PROPOFOL N/A 05/23/2016   Dr. 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 INCISION AND DRAINAGE ABSCESS N/A 01/06/2017   Procedure: INCISION AND DRAINAGE ABDOMINAL WALL ABSCESS;  Surgeon: Aviva Signs, MD;  Location: [facility] ORS;  Service: General;  Laterality: N/A;  . tendon repar Right    wrist  . WOUND EXPLORATION Right 06/24/2013   Procedure: exploration of traumatic wound right wrist;  Surgeon: Tennis Must, MD;  Location: [facility];  Service: Orthopedics;  Laterality: Right;    OB History    Gravida Para Term Preterm AB Living   4 4 4     3    SAB TAB Ectopic Multiple Live Births                   Home Medications    Prior to Admission medications   Medication Sig Start Date End Date Taking? Authorizing Provider  amLODipine (NORVASC) 10 MG tablet Take 0.5 tablets (5 mg total) by mouth daily. 01/03/17   Doreatha Lew, MD    Cyanocobalamin (VITAMIN B-12) 2500 MCG TABS Take 5,000 mcg by mouth daily. 11/26/16   Reyne Dumas, MD  cyclobenzaprine (FLEXERIL) 10 MG tablet Take 1 tablet (10 mg total) by mouth 3 (three) times daily as needed. 04/26/17   [place], Tammy, PA-C  escitalopram (LEXAPRO) 20 MG tablet Take 20 mg daily by mouth.    [provider]  fluticasone (VERAMYST) 27.5 MCG/SPRAY nasal spray Place 2 sprays daily as needed into the nose.    [provider]  lisinopril (PRINIVIL,ZESTRIL) 20 MG tablet Take 1 tablet (20 mg total) by mouth daily. 11/26/16 11/26/17  Reyne Dumas, MD  naproxen (NAPROSYN) 500 MG tablet Take 1 tablet (500 mg total) by mouth 2 (two) times daily with a meal. 04/26/17   [place], Tammy, PA-C  ondansetron (ZOFRAN ODT) 4 MG disintegrating tablet Take 1 tablet (4 mg total) every 8 (eight) hours as needed by mouth for nausea or vomiting. 01/25/17   Long, Wonda Olds, MD  pantoprazole (PROTONIX) 20 MG tablet Take 1 tablet (20 mg total) by mouth daily. 09/23/16   Francine Graven, DO  zolpidem (AMBIEN) 5 MG tablet Take 5 mg by mouth at bedtime as needed for sleep.    [provider]    Family History Family History  Problem Relation Age of Onset  . Brain cancer Son   . Schizophrenia Son   . Cancer Son        brain  . Lung cancer Father   . Cancer Father        mets  . Drug abuse Mother   . Breast cancer Maternal Aunt   . Bipolar disorder Maternal Aunt   . Drug abuse Maternal Aunt   . Colon cancer Maternal Grandmother        late 37s, early 16s  . Drug abuse Sister   . Drug abuse Brother   . Bipolar disorder Paternal Grandfather   . Bipolar disorder Cousin   . Liver disease Neg Hx     Social History Social History   Tobacco Use  . Smoking status: Current Every Day Smoker    Packs/day: 0.50    Years: 35.00    Pack years: 17.50    Types: Cigarettes  . Smokeless tobacco: Never Used  Substance Use Topics  . Alcohol use: No    Alcohol/week: 0.0 oz  Comment: 1 beer 4 days ago  . Drug use: No    Comment: quit 09/2012  on disability for lupus and PUD   Allergies   Codeine; Morphine and related; and Reglan [metoclopramide]   Review of Systems Review of Systems  All other systems reviewed and are negative.    Physical Exam Updated Vital Signs BP 121/89 (BP Location: Left Arm)   Pulse 92   Temp 98.9 F (37.2 C) (Oral)   Resp 18   Ht 5\' 3"  (1.6 m)   Wt 61.7 kg (136 lb)   SpO2 98%   BMI 24.09 kg/m   Vital signs normal    Physical Exam  Constitutional: She is oriented to person, place, and time. She appears well-developed and well-nourished.  Non-toxic appearance. She does not appear ill. No distress.  HENT:  Head: Normocephalic and atraumatic.  Right Ear: External ear normal.  Left Ear: External ear normal.  Nose: Nose normal. No mucosal edema or rhinorrhea.  Mouth/Throat: Oropharynx is clear and moist and mucous membranes are normal. No dental abscesses or uvula swelling.  Eyes: Conjunctivae and EOM are normal. Pupils are equal, round, and reactive to light.  Neck: Normal range of motion and full passive range of motion without pain. Neck supple.  Cardiovascular: Normal rate, regular rhythm and normal heart sounds. Exam reveals no gallop and no friction rub.  No murmur heard. Pulmonary/Chest: Effort normal and breath sounds normal. No respiratory distress. She has no wheezes. She has no rhonchi. She has no rales. She exhibits no tenderness and no crepitus.  Abdominal: Soft. Normal appearance and bowel sounds are normal. She exhibits no distension. There is generalized tenderness. There is no rebound and no guarding.    There is a dimpled area just to the right of her umbilicus which she states is the site of the new fistula.  She has 2 other areas that are below the umbilicus.  Musculoskeletal: Normal range of motion. She exhibits no edema or tenderness.  Moves all extremities well.   Neurological: She is alert and  oriented to person, place, and time. She has normal strength. No cranial nerve deficit.  Skin: Skin is warm, dry and intact. No rash noted. No erythema. No pallor.  Psychiatric: She has a normal mood and affect. Her speech is normal and behavior is normal. Her mood appears not anxious.  Nursing note and vitals reviewed.    ED Treatments / Results  Labs (all labs ordered are listed, but only abnormal results are displayed) Results for orders placed or performed during the hospital encounter of 05/09/17  Comprehensive metabolic panel  Result Value Ref Range   Sodium 145 135 - 145 mmol/L   Potassium 3.0 (L) 3.5 - 5.1 mmol/L   Chloride 107 101 - 111 mmol/L   CO2 22 22 - 32 mmol/L   Glucose, Bld 95 65 - 99 mg/dL   BUN 6 6 - 20 mg/dL   Creatinine, Ser 0.52 0.44 - 1.00 mg/dL   Calcium 9.7 8.9 - 10.3 mg/dL   Total Protein 8.2 (H) 6.5 - 8.1 g/dL   Albumin 4.1 3.5 - 5.0 g/dL   AST 32 15 - 41 U/L   ALT 18 14 - 54 U/L   Alkaline Phosphatase 93 38 - 126 U/L   Total Bilirubin 0.4 0.3 - 1.2 mg/dL   GFR calc non Af Amer >60 >60 mL/min   GFR calc Af Amer >60 >60 mL/min   Anion gap 16 (H) 5 - 15  CBC  Result Value Ref Range   WBC 4.5 4.0 - 10.5 K/uL   RBC 4.46 3.87 - 5.11 MIL/uL   Hemoglobin 11.8 (L) 12.0 - 15.0 g/dL   HCT 35.4 (L) 36.0 - 46.0 %   MCV 79.4 78.0 - 100.0 fL   MCH 26.5 26.0 - 34.0 pg   MCHC 33.3 30.0 - 36.0 g/dL   RDW 19.6 (H) 11.5 - 15.5 %   Platelets 396 150 - 400 K/uL  Type and screen [facility]  Result Value Ref Range   ABO/RH(D) O POS    Antibody Screen NEG    Sample Expiration      05/12/2017 Performed at [facility], [address] [place], [place] [address]    Laboratory interpretation all normal except hypokalemia, minimal anemia    EKG  EKG Interpretation None       Radiology Ct Abdomen Pelvis Wo Contrast  Result Date: 05/10/2017 CLINICAL DATA:  Rectal bleeding with cutaneous fistula to the right of umbilicus EXAM: CT ABDOMEN AND PELVIS  WITHOUT CONTRAST TECHNIQUE: Multidetector CT imaging of the abdomen and pelvis was performed following the standard protocol without IV contrast. COMPARISON:  CT 01/04/2017, 01/02/2017, 11/25/2016, 09/23/2016, 05/22/2016, 04/25/2016 FINDINGS: Lower chest: Lung bases demonstrate no acute consolidation or pleural effusion. Normal heart size. Coronary vascular calcification. Surgical clips around the GE junction Hepatobiliary: No focal hepatic abnormality. Surgical clips at the gallbladder fossa. Stable enlarged extrahepatic bile duct. Pancreas: Unremarkable. No pancreatic ductal dilatation or surrounding inflammatory changes. Spleen: Normal in size without focal abnormality. Adrenals/Urinary Tract: Stable 2 cm left adrenal gland mass. The right adrenal gland is not seen. Kidneys show no hydronephrosis. The bladder is unremarkable Stomach/Bowel: The stomach is nonenlarged. Postsurgical changes of the stomach. No dilated small bowel. Surgical suture in the right lower quadrant small bowel with mild wall thickening here. Diffuse collapsed appearance of the colon. No rectal soft tissue thickening or surrounding inflammation. Vascular/Lymphatic: Moderate aortic atherosclerosis. No aneurysmal dilatation. No significantly enlarged lymph nodes Reproductive: Status post hysterectomy. No adnexal masses. Other: Negative for free air or free fluid. Irregular thick-walled rim enhancing fluid collection within the deep subcutaneous soft tissues of the midline abdominal wall abutting the rectus sheath and musculature. This is contiguous with linear tracts leading to the skin surface and are consistent with fistulous. Decreased phlegmonous change in the right subcutaneous soft tissues near the umbilical region but now with tract leading to the skin consistent with fistula. Musculoskeletal: No acute or significant osseous findings. IMPRESSION: 1. No CT evidence for acute intra-abdominal or pelvic abnormality. Extensive postsurgical  changes. No rectal wall thickening or perirectal inflammation 2. Chronic inflammatory process within the periumbilical region of the ventral abdominal wall with multiple cutaneous fistulous, the more inferior of which appear chronic. 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 Electronically Signed   By: Donavan Foil M.D.   On: 05/10/2017 03:00    Procedures Procedures (including critical care time)  Medications Ordered in ED Medications  ondansetron (ZOFRAN-ODT) disintegrating tablet 8 mg (8 mg Oral Given 05/10/17 0033)  sodium chloride 0.9 % bolus 1,851 mL (1,851 mLs Intravenous New Bag/Given 05/10/17 0153)  sodium chloride 0.9 % bolus 500 mL (0 mLs Intravenous Stopped 05/10/17 0423)  ondansetron (ZOFRAN-ODT) disintegrating tablet 8 mg (8 mg Oral Given 05/10/17 0421)     Initial Impression / Assessment and Plan / ED Course  I have reviewed the triage vital signs and  the nursing notes.  Pertinent labs & imaging results that were available during my care of the patient were reviewed by me and considered in my medical decision making (see chart for details).     Patient was very difficult to get IV started, she has a 24 on the volar aspect of her wrist.  Therefore CT was done with oral contrast only.  Review of patient's lab work shows she does not have a significant anemia.  Her CT scan does not show any acute reason for her to have abdominal pain other than she appears to have chronic abdominal pain.  Patient states she has gone to the bathroom about 4 times to have rectal bleeding.  The nurse did see the last episode and said it was dark red and significant.  Patient was given the results of her CT scan.  We discussed that there was nothing new on her CT scan and that the new fistula is from the prior and area of fluid collection in her abdomen.  We discussed that she would be admitted to evaluate her rectal  bleeding.  05:15 AM Dr Myna Hidalgo will admit  Per [facility] patient gets #60 tramadol 50 mg tablets monthly last filled January 31 and #30 Ambien 10 mg tablets last filled January 31 prescribed by her PCP.  She also has intermittent small amounts of other narcotics such as oxycodone and hydrocodone from the ED.  Final Clinical Impressions(s) / ED Diagnoses   Final diagnoses:  Rectal bleeding  Abdominal wall fistula    Plan admission  Rolland Porter, MD, Barbette Or, MD 05/10/17 [id number]

## 2017-05-10 NOTE — ED Notes (Signed)
Pt ambulatory to bathroom.  States she had a bloody BM.  Also states she vomited in the bathroom.

## 2017-05-10 NOTE — ED Notes (Signed)
Changed dressings to abdomen.

## 2017-05-10 NOTE — ED Notes (Signed)
Pt ambulated to restroom for an episode of diarrhea. When returned to room, Pt experienced an episode of vomiting.Pt states the Zofran is not helping with her nausea.

## 2017-05-10 NOTE — H&P (Signed)
History and Physical    Angel Price YQM:[id number] DOB: 16-Dec-1965 DOA: 05/09/2017  PCP: Rosita Fire, MD   Patient coming from: Home  Chief Complaint: Rectal bleeding   HPI: Angel Price is a 52 y.o. female with medical history significant for major depressive disorder, history of gastric bypass, chronic periumbilical fistulas, and hypertension, now presenting to the emergency department for evaluation of rectal bleeding.  Patient reports that she had been in her usual state of health until yesterday morning at approximately 6 AM when she passed burgundy stool and clots.  Since that time, she reports having numerous similar episodes.  She denies lightheadedness, headache, or chest pain.  Denies hematemesis.  She had a remote gastric bypass, denies NSAID use, reports adherence with her daily PPI.  No fevers or chills.  ED Course: Upon arrival to the ED, patient is found to be afebrile, saturating well on room air, and with vitals otherwise normal.  Chemistry panel reveals a  potassium of 3.0 and CBC is notable for hemoglobin of 11.8, up from 9.1 in November.  CT the abdomen and pelvis is negative for acute findings, but notable for chronic inflammatory process within the periumbilical region involving multiple cutaneous fistulas.  Type and screen was performed and the patient was treated with 2 L of normal saline in the ED.  She remains hemodynamically stable, in no apparent respiratory distress, and will be admitted to the medical-surgical unit for ongoing evaluation and management of rectal bleeding.  Review of Systems:  All other systems reviewed and apart from HPI, are negative.  Past Medical History:  Diagnosis Date  . Abscess    soft tissue  . Adrenal mass ([place])   . Alcohol abuse   . Anxiety   . Blood transfusion without reported diagnosis   . Chronic abdominal pain   . Chronic wound infection of abdomen   . Colon polyp    colonoscopy 04/2014  . Depression   .  Diverticulosis    colonoscopy 04/2014 moderat pan colonic  . Gastritis    EGD 05/2014  . Gastroparesis Nov 2015  . GERD (gastroesophageal reflux disease)   . Hemorrhoid    internal large  . Hiatal hernia   . History of Billroth II operation   . Hypertension   . Lung nodule    CT 02/2014 needs repeat 1 month  . Lung nodule < 6cm on CT 04/25/2014  . Lupus   . Nausea and vomiting    chronic, recurrent  . Pancreatitis   . Schatzki's ring    patent per EGD 04/2014  . Sickle cell trait ([place])   . Suicide attempt ([place])   . 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 ABDOMINAL HYSTERECTOMY  2013   [place]  . ABDOMINAL SURGERY    . ADRENALECTOMY Right   . AGILE CAPSULE N/A 01/05/2015   Procedure: AGILE CAPSULE;  Surgeon: Daneil Dolin, MD;  Location: [facility];  Service: Endoscopy;  Laterality: N/A;  0700  . Billroth II procedure      [place], first 2000, 2005/2006.  Marland Kitchen BIOPSY  05/20/2013   Procedure: BIOPSIES OF ASCENDING AND SIGMOID COLON;  Surgeon: Daneil Dolin, MD;  Location: [facility];  Service: Endoscopy;;  . BIOPSY  04/26/2014   Procedure: BIOPSIES;  Surgeon: Danie Binder, MD;  Location: [facility];  Service: Endoscopy;;  . CHOLECYSTECTOMY    . COLONOSCOPY  in [place]  . COLONOSCOPY WITH PROPOFOL N/A 05/20/2013   Dr.Rourk- inadequate prep, normal appearing rectum, grossly normal colon aside from pancolonic diverticula, normal terminal ileum bx= unremarkable colonic mucosa. Due for early interval 2016.   Marland Kitchen COLONOSCOPY WITH PROPOFOL N/A 04/26/2014   GLO:VFIEPP ileum/one colon polyp removed/moderate pan-colonic diverticulosis/large internal hemorrhoids  . COLONOSCOPY WITH PROPOFOL N/A 12/23/2014   Dr.Rourk- minimal internal hemorrhoids, pancolonic diverticulosis  . COLONOSCOPY WITH PROPOFOL N/A 08/23/2016   Procedure: COLONOSCOPY WITH PROPOFOL;  Surgeon: Danie Binder, MD;  [facility];  Service: Endoscopy;   Laterality: N/A;  . DEBRIDEMENT OF ABDOMINAL WALL ABSCESS N/A 02/08/2013   Procedure: DEBRIDEMENT OF ABDOMINAL WALL ABSCESS;  Surgeon: Jamesetta So, MD;  Location: [facility];  Service: General;  Laterality: N/A;  . ESOPHAGOGASTRODUODENOSCOPY (EGD) WITH PROPOFOL N/A 05/20/2013   Dr.Rourk- s/p prior gastric surgery with normal esophagus, residual gastric mucosa and patent efferent limb  . ESOPHAGOGASTRODUODENOSCOPY (EGD) WITH PROPOFOL N/A 02/03/2014   Dr. Gala Romney:  s/p hemigastrectomy with retained gastric contents. Residual gastric mucosa and efferent limb appeared normal otherwise. Query gastroparesis.   Marland Kitchen ESOPHAGOGASTRODUODENOSCOPY (EGD) WITH PROPOFOL N/A 04/26/2014   IRJ:JOACZYSA'Y ring/small HH/mild non-erosive gasrtitis/normal anastomosis  . ESOPHAGOGASTRODUODENOSCOPY (EGD) WITH PROPOFOL N/A 12/23/2014   Dr.Rourk- s/p prior hemigastrctomy, active oozing from anastomotic suture site, hemostasis achieved  . ESOPHAGOGASTRODUODENOSCOPY (EGD) WITH PROPOFOL N/A 05/23/2016   Dr. 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 INCISION AND DRAINAGE ABSCESS N/A 01/06/2017   Procedure: INCISION AND DRAINAGE ABDOMINAL WALL ABSCESS;  Surgeon: Aviva Signs, MD;  Location: [facility];  Service: General;  Laterality: N/A;  . tendon repar Right    wrist  . WOUND EXPLORATION Right 06/24/2013   Procedure: exploration of traumatic wound right wrist;  Surgeon: Tennis Must, MD;  Location: [facility];  Service: Orthopedics;  Laterality: Right;     reports that she has been smoking cigarettes.  She has a 17.50 pack-year smoking history. she has never used smokeless tobacco. She reports that she does not drink alcohol or use drugs.  Allergies  Allergen Reactions  . Codeine Hives  . Morphine And Related Hives  . Reglan [Metoclopramide] Other (See Comments)    EPS symptoms     Family History  Problem Relation Age of Onset  . Brain  cancer Son   . Schizophrenia Son   . Cancer Son        brain  . Lung cancer Father   . Cancer Father        mets  . Drug abuse Mother   . Breast cancer Maternal Aunt   . Bipolar disorder Maternal Aunt   . Drug abuse Maternal Aunt   . Colon cancer Maternal Grandmother        late 42s, early 30s  . Drug abuse Sister   . Drug abuse Brother   . Bipolar disorder Paternal Grandfather   . Bipolar disorder Cousin   . Liver disease Neg Hx      Prior to Admission medications   Medication Sig Start Date End Date Taking? Authorizing Provider  amLODipine (NORVASC) 10 MG tablet Take 0.5 tablets (5 mg total) by mouth daily. 01/03/17   Doreatha Lew, MD  Cyanocobalamin (VITAMIN B-12) 2500 MCG TABS Take 5,000 mcg by mouth daily. 11/26/16   Reyne Dumas, MD  cyclobenzaprine (FLEXERIL) 10 MG tablet Take 1 tablet (10 mg total) by mouth 3 (three)  times daily as needed. 04/26/17   Triplett, Tammy, PA-C  escitalopram (LEXAPRO) 20 MG tablet Take 20 mg daily by mouth.    [provider]  fluticasone (VERAMYST) 27.5 MCG/SPRAY nasal spray Place 2 sprays daily as needed into the nose.    [provider]  lisinopril (PRINIVIL,ZESTRIL) 20 MG tablet Take 1 tablet (20 mg total) by mouth daily. 11/26/16 11/26/17  Reyne Dumas, MD  naproxen (NAPROSYN) 500 MG tablet Take 1 tablet (500 mg total) by mouth 2 (two) times daily with a meal. 04/26/17   Triplett, Tammy, PA-C  ondansetron (ZOFRAN ODT) 4 MG disintegrating tablet Take 1 tablet (4 mg total) every 8 (eight) hours as needed by mouth for nausea or vomiting. 01/25/17   Long, Wonda Olds, MD  pantoprazole (PROTONIX) 20 MG tablet Take 1 tablet (20 mg total) by mouth daily. 09/23/16   Francine Graven, DO  zolpidem (AMBIEN) 5 MG tablet Take 5 mg by mouth at bedtime as needed for sleep.    [provider]    Physical Exam: Vitals:   05/09/17 2120 05/10/17 0154  BP: (!) 150/112 121/89  Pulse: (!) 114 92  Resp: 16 18  Temp: 98.8 F (37.1  C) 98.9 F (37.2 C)  TempSrc: Oral Oral  SpO2: 99% 98%  Weight: 61.7 kg (136 lb)   Height: 5\' 3"  (1.6 m)       Constitutional: NAD, calm  Eyes: PERTLA, lids and conjunctivae normal ENMT: Mucous membranes are moist. Posterior pharynx clear of any exudate or lesions.   Neck: normal, supple, no masses, no thyromegaly Respiratory: clear to auscultation bilaterally, no wheezing, no crackles. Normal respiratory effort. No accessory muscle use.  Cardiovascular: S1 & S2 heard, regular rate and rhythm. No extremity edema. No significant JVD. Abdomen: No distension, soft, mild generalized tenderness without rebound pain or guarding. Bowel sounds active.  Musculoskeletal: no clubbing / cyanosis. No joint deformity upper and lower ex no significanttremities.    Skin: Skin defects periumbilically with scant drainage, no surrounding erythema or induration. Warm, dry, well-perfused. Neurologic: CN 2-12 grossly intact. Sensation intact. Strength 5/5 in all 4 limbs.  Psychiatric:  Alert and oriented x 3. Calm, cooperative.     Labs on Admission: I have personally reviewed following labs and imaging studies  CBC: Recent Labs  Lab 05/09/17 2225  WBC 4.5  HGB 11.8*  HCT 35.4*  MCV 79.4  PLT 161   Basic Metabolic Panel: Recent Labs  Lab 05/09/17 2225  NA 145  K 3.0*  CL 107  CO2 22  GLUCOSE 95  BUN 6  CREATININE 0.52  CALCIUM 9.7   GFR: Estimated Creatinine Clearance: 70.4 mL/min (by C-G formula based on SCr of 0.52 mg/dL). Liver Function Tests: Recent Labs  Lab 05/09/17 2225  AST 32  ALT 18  ALKPHOS 93  BILITOT 0.4  PROT 8.2*  ALBUMIN 4.1   No results for input(s): LIPASE, AMYLASE in the last 168 hours. No results for input(s): AMMONIA in the last 168 hours. Coagulation Profile: No results for input(s): INR, PROTIME in the last 168 hours. Cardiac Enzymes: No results for input(s): CKTOTAL, CKMB, CKMBINDEX, TROPONINI in the last 168 hours. BNP (last 3 results) No  results for input(s): PROBNP in the last 8760 hours. HbA1C: No results for input(s): HGBA1C in the last 72 hours. CBG: No results for input(s): GLUCAP in the last 168 hours. Lipid Profile: No results for input(s): CHOL, HDL, LDLCALC, TRIG, CHOLHDL, LDLDIRECT in the last 72 hours. Thyroid Function Tests:  No results for input(s): TSH, T4TOTAL, FREET4, T3FREE, THYROIDAB in the last 72 hours. Anemia Panel: No results for input(s): VITAMINB12, FOLATE, FERRITIN, TIBC, IRON, RETICCTPCT in the last 72 hours. Urine analysis:    Component Value Date/Time   COLORURINE STRAW (A) 01/04/2017 1859   APPEARANCEUR CLEAR 01/04/2017 1859   LABSPEC 1.042 (H) 01/04/2017 1859   PHURINE 6.0 01/04/2017 1859   GLUCOSEU NEGATIVE 01/04/2017 1859   HGBUR NEGATIVE 01/04/2017 1859   BILIRUBINUR NEGATIVE 01/04/2017 1859   KETONESUR NEGATIVE 01/04/2017 1859   PROTEINUR NEGATIVE 01/04/2017 1859   UROBILINOGEN 0.2 11/26/2014 0915   NITRITE NEGATIVE 01/04/2017 1859   LEUKOCYTESUR NEGATIVE 01/04/2017 1859   Sepsis Labs: @LABRCNTIP (procalcitonin:4,lacticidven:4) )No results found for this or any previous visit (from the past 240 hour(s)).   Radiological Exams on Admission: Ct Abdomen Pelvis Wo Contrast  Result Date: 05/10/2017 CLINICAL DATA:  Rectal bleeding with cutaneous fistula to the right of umbilicus EXAM: CT ABDOMEN AND PELVIS WITHOUT CONTRAST TECHNIQUE: Multidetector CT imaging of the abdomen and pelvis was performed following the standard protocol without IV contrast. COMPARISON:  CT 01/04/2017, 01/02/2017, 11/25/2016, 09/23/2016, 05/22/2016, 04/25/2016 FINDINGS: Lower chest: Lung bases demonstrate no acute consolidation or pleural effusion. Normal heart size. Coronary vascular calcification. Surgical clips around the GE junction Hepatobiliary: No focal hepatic abnormality. Surgical clips at the gallbladder fossa. Stable enlarged extrahepatic bile duct. Pancreas: Unremarkable. No pancreatic ductal dilatation  or surrounding inflammatory changes. Spleen: Normal in size without focal abnormality. Adrenals/Urinary Tract: Stable 2 cm left adrenal gland mass. The right adrenal gland is not seen. Kidneys show no hydronephrosis. The bladder is unremarkable Stomach/Bowel: The stomach is nonenlarged. Postsurgical changes of the stomach. No dilated small bowel. Surgical suture in the right lower quadrant small bowel with mild wall thickening here. Diffuse collapsed appearance of the colon. No rectal soft tissue thickening or surrounding inflammation. Vascular/Lymphatic: Moderate aortic atherosclerosis. No aneurysmal dilatation. No significantly enlarged lymph nodes Reproductive: Status post hysterectomy. No adnexal masses. Other: Negative for free air or free fluid. Irregular thick-walled rim enhancing fluid collection within the deep subcutaneous soft tissues of the midline abdominal wall abutting the rectus sheath and musculature. This is contiguous with linear tracts leading to the skin surface and are consistent with fistulous. Decreased phlegmonous change in the right subcutaneous soft tissues near the umbilical region but now with tract leading to the skin consistent with fistula. Musculoskeletal: No acute or significant osseous findings. IMPRESSION: 1. No CT evidence for acute intra-abdominal or pelvic abnormality. Extensive postsurgical changes. No rectal wall thickening or perirectal inflammation 2. Chronic inflammatory process within the periumbilical region of the ventral abdominal wall with multiple cutaneous fistulous, the more inferior of which appear chronic. 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 Electronically Signed   By: Donavan Foil M.D.   On: 05/10/2017 03:00    EKG: Not performed.   Assessment/Plan   1. Rectal bleeding; anemia  - Presents with several episodes of rectal bleeding over the past 24 hours, described as burgundy  with clots; scant dark blood passed in ED   - Hgb is 11.8 on admission, up from 9.1 in November - BUN is 6; she is hemodynamically stable  - She has hx of gastric bypass and hx of hemorrhoids and diverticulosis  - Takes a daily Protonix, denies use of NSAID  - Most likely diverticular  - Type and screen done in ED  - Repeat CBC, continue PPI with IV  Protonix, consult with GI   2. Major depression  - Stable; denies SI, HI, or hallucinations  - Continue Lexapro    3. Hypertension  - BP at goal  - Use hydralazine IVP's prn while NPO    4. Hypokalemia  - Serum potassium is 3.0 on admission  - KCl added to IVF, repeat chem panel in am    5. Chronic periumbilical inflammation with fistulas  - Does not appear acutely infected and there is no fever or leukocytosis     DVT prophylaxis: SCD's  Code Status: Full  Family Communication: Discussed with patient Disposition Plan: Admit to med-surg Consults called: GI consulted via EMR Admission status: Inpatient    Vianne Bulls, MD [facility] Pager [phone]  If 7PM-7AM, please contact night-coverage [email or web address] Password [facility]  05/10/2017, 5:29 AM

## 2017-05-10 NOTE — ED Notes (Signed)
Per RN, Angel Price, pt had BM that was loose in consistency, small amount with dark red blood

## 2017-05-11 ENCOUNTER — Inpatient Hospital Stay (HOSPITAL_COMMUNITY): Payer: Medicare Other | Admitting: Anesthesiology

## 2017-05-11 ENCOUNTER — Other Ambulatory Visit: Payer: Self-pay

## 2017-05-11 ENCOUNTER — Encounter (HOSPITAL_COMMUNITY)
Admission: EM | Disposition: A | Discharge status: Home / Self Care | Payer: Self-pay | Source: Home / Self Care | Attending: Internal Medicine

## 2017-05-11 ENCOUNTER — Encounter (HOSPITAL_COMMUNITY): Payer: Self-pay | Admitting: Anesthesiology

## 2017-05-11 DIAGNOSIS — K922 Gastrointestinal hemorrhage, unspecified: Secondary | ICD-10-CM

## 2017-05-11 DIAGNOSIS — S31109D Unspecified open wound of abdominal wall, unspecified quadrant without penetration into peritoneal cavity, subsequent encounter: Secondary | ICD-10-CM

## 2017-05-11 DIAGNOSIS — E538 Deficiency of other specified B group vitamins: Secondary | ICD-10-CM

## 2017-05-11 HISTORY — PX: ESOPHAGOGASTRODUODENOSCOPY (EGD) WITH PROPOFOL: SHX5813

## 2017-05-11 LAB — CBC
HCT: 29 % — ABNORMAL LOW (ref 36.0–46.0)
HCT: 28.4 % — ABNORMAL LOW (ref 36.0–46.0)
HEMOGLOBIN: 9.6 g/dL — AB (ref 12.0–15.0)
Hemoglobin: 9.3 g/dL — ABNORMAL LOW (ref 12.0–15.0)
MCH: 26.5 pg (ref 26.0–34.0)
MCH: 26.6 pg (ref 26.0–34.0)
MCHC: 33.1 g/dL (ref 30.0–36.0)
MCHC: 32.7 g/dL (ref 30.0–36.0)
MCV: 80.1 fL (ref 78.0–100.0)
MCV: 81.1 fL (ref 78.0–100.0)
PLATELETS: 284 10*3/uL (ref 150–400)
Platelets: 316 10*3/uL (ref 150–400)
RBC: 3.62 MIL/uL — ABNORMAL LOW (ref 3.87–5.11)
RBC: 3.5 MIL/uL — ABNORMAL LOW (ref 3.87–5.11)
RDW: 19.1 % — AB (ref 11.5–15.5)
RDW: 19.2 % — AB (ref 11.5–15.5)
WBC: 4.7 10*3/uL (ref 4.0–10.5)
WBC: 4.8 10*3/uL (ref 4.0–10.5)

## 2017-05-11 LAB — BASIC METABOLIC PANEL
ANION GAP: 8 (ref 5–15)
BUN: <5 mg/dL — ABNORMAL LOW (ref 6–20)
CALCIUM: 8.9 mg/dL (ref 8.9–10.3)
CO2: 22 mmol/L (ref 22–32)
Chloride: 110 mmol/L (ref 101–111)
Creatinine, Ser: 0.55 mg/dL (ref 0.44–1.00)
GFR calc Af Amer: >60 mL/min (ref >60)
GLUCOSE: 89 mg/dL (ref 65–99)
Potassium: 3.5 mmol/L (ref 3.5–5.1)
SODIUM: 140 mmol/L (ref 135–145)

## 2017-05-11 LAB — MRSA PCR SCREENING: MRSA BY PCR: NEGATIVE

## 2017-05-11 LAB — MAGNESIUM: MAGNESIUM: 1.7 mg/dL (ref 1.7–2.4)

## 2017-05-11 SURGERY — ESOPHAGOGASTRODUODENOSCOPY (EGD) WITH PROPOFOL
Anesthesia: Monitor Anesthesia Care

## 2017-05-11 MED ORDER — ENALAPRILAT 1.25 MG/ML IV SOLN
0.6250 mg | Freq: Four times a day (QID) | INTRAVENOUS | Status: DC
  Administered 2017-05-11 – 2017-05-12 (×3): 0.625 mg via INTRAVENOUS
  Filled 2017-05-11 (×4): qty 2

## 2017-05-11 MED ORDER — MAGNESIUM SULFATE 4 GM/100ML IV SOLN: 4.0000 g | Freq: Once | INTRAVENOUS | Status: DC

## 2017-05-11 MED ORDER — PROPOFOL 10 MG/ML IV BOLUS
INTRAVENOUS | Status: AC
  Filled 2017-05-11: qty 20

## 2017-05-11 MED ORDER — ONDANSETRON HCL 4 MG/2ML IJ SOLN
4.0000 mg | Freq: Four times a day (QID) | INTRAMUSCULAR | Status: DC | PRN
  Administered 2017-05-14 – 2017-05-15 (×5): 4 mg via INTRAVENOUS
  Filled 2017-05-11 (×5): qty 2

## 2017-05-11 MED ORDER — FENTANYL CITRATE (PF) 100 MCG/2ML IJ SOLN
INTRAMUSCULAR | Status: DC | PRN
  Administered 2017-05-11 (×2): 25 ug via INTRAVENOUS

## 2017-05-11 MED ORDER — MAGNESIUM SULFATE 2 GM/50ML IV SOLN: 2.0000 g | INTRAVENOUS | Status: AC

## 2017-05-11 MED ORDER — MAGNESIUM SULFATE 4 GM/100ML IV SOLN
4.0000 g | Freq: Once | INTRAVENOUS | Status: AC
  Administered 2017-05-11: 4 g via INTRAVENOUS
  Filled 2017-05-11: qty 100

## 2017-05-11 MED ORDER — LIDOCAINE VISCOUS 2 % MT SOLN
OROMUCOSAL | Status: AC
  Filled 2017-05-11: qty 15

## 2017-05-11 MED ORDER — MIDAZOLAM HCL 5 MG/5ML IJ SOLN
INTRAMUSCULAR | Status: DC | PRN
  Administered 2017-05-11: 2 mg via INTRAVENOUS

## 2017-05-11 MED ORDER — PROPOFOL 500 MG/50ML IV EMUL
INTRAVENOUS | Status: DC | PRN
  Administered 2017-05-11: 150 ug/kg/min via INTRAVENOUS

## 2017-05-11 MED ORDER — DIPHENHYDRAMINE HCL 50 MG/ML IJ SOLN
12.5000 mg | Freq: Once | INTRAMUSCULAR | Status: AC
  Administered 2017-05-11: 12.5 mg via INTRAVENOUS
  Filled 2017-05-11: qty 1

## 2017-05-11 MED ORDER — PROCHLORPERAZINE EDISYLATE 5 MG/ML IJ SOLN
10.0000 mg | Freq: Four times a day (QID) | INTRAMUSCULAR | Status: DC | PRN
  Administered 2017-05-12 – 2017-05-14 (×5): 10 mg via INTRAVENOUS
  Filled 2017-05-11 (×6): qty 2

## 2017-05-11 MED ORDER — LIDOCAINE VISCOUS 2 % MT SOLN
OROMUCOSAL | Status: DC | PRN
  Administered 2017-05-11: 1 via OROMUCOSAL

## 2017-05-11 MED ORDER — PROPOFOL 10 MG/ML IV BOLUS
INTRAVENOUS | Status: DC | PRN
  Administered 2017-05-11: 10 mg via INTRAVENOUS
  Administered 2017-05-11: 20 mg via INTRAVENOUS

## 2017-05-11 MED ORDER — FENTANYL CITRATE (PF) 100 MCG/2ML IJ SOLN
INTRAMUSCULAR | Status: AC
  Filled 2017-05-11: qty 2

## 2017-05-11 MED ORDER — MIDAZOLAM HCL 2 MG/2ML IJ SOLN
INTRAMUSCULAR | Status: AC
  Filled 2017-05-11: qty 2

## 2017-05-11 MED ORDER — FOLIC ACID 1 MG PO TABS
1.0000 mg | ORAL_TABLET | Freq: Every day | ORAL | Status: DC
  Administered 2017-05-12 – 2017-05-17 (×6): 1 mg via ORAL
  Filled 2017-05-11 (×7): qty 1

## 2017-05-11 MED ORDER — LACTATED RINGERS IV SOLN
INTRAVENOUS | Status: DC | PRN
  Administered 2017-05-11: 10:00:00 via INTRAVENOUS

## 2017-05-11 MED ORDER — SODIUM CHLORIDE 0.9% FLUSH
INTRAVENOUS | Status: AC
  Filled 2017-05-11: qty 10

## 2017-05-11 MED ORDER — POTASSIUM CHLORIDE CRYS ER 20 MEQ PO TBCR
40.0000 meq | EXTENDED_RELEASE_TABLET | Freq: Once | ORAL | Status: AC
  Administered 2017-05-11: 40 meq via ORAL
  Filled 2017-05-11: qty 2

## 2017-05-11 MED ORDER — PROCHLORPERAZINE EDISYLATE 5 MG/ML IJ SOLN: 10.0000 mg | Freq: Four times a day (QID) | INTRAMUSCULAR | Status: DC | PRN

## 2017-05-11 MED ORDER — ONDANSETRON HCL 4 MG/2ML IJ SOLN
INTRAMUSCULAR | Status: DC | PRN
  Administered 2017-05-11: 4 mg via INTRAVENOUS

## 2017-05-11 NOTE — Op Note (Addendum)
[facility] Patient Name: Angel Price Procedure Date: 05/11/2017 9:44 AM MRN: [id number] Date of Birth: [birth year] Attending MD: Norvel Richards , MD CSN: [id number] Age: 52 Admit Type: Outpatient Procedure:                Upper GI endoscopy Indications:              Hematochezia Providers:                Norvel Richards, MD, Lurline Del, RN, Nelma Rothman, Technician, Randa Spike, Technician Referring MD:              Medicines:                Propofol per Anesthesia Complications:            No immediate complications. Estimated Blood Loss:     Estimated blood loss: none. Procedure:                Pre-Anesthesia Assessment:                           - Prior to the procedure, a History and Physical                            was performed, and patient medications and                            allergies were reviewed. The patient's tolerance of                            previous anesthesia was also reviewed. The risks                            and benefits of the procedure and the sedation                            options and risks were discussed with the patient.                            All questions were answered, and informed consent                            was obtained. Prior Anticoagulants: The patient has                            taken no previous anticoagulant or antiplatelet                            agents. ASA Grade Assessment: III - A patient with                            severe systemic disease. After reviewing the risks  and benefits, the patient was deemed in                            satisfactory condition to undergo the procedure.                           After obtaining informed consent, the endoscope was                            passed under direct vision. Throughout the                            procedure, the patient's blood pressure, pulse, and   oxygen saturations were monitored continuously. The                            [id number] ([id number]) scope was introduced through the                            mouth, and advanced to the efferent jejunal loop.                            The upper GI endoscopy was accomplished without                            difficulty. The patient tolerated the procedure                            well. Scope In: 10:21:49 AM Scope Out: 10:33:11 AM Total Procedure Duration: 0 hours 11 minutes 22 seconds  Findings:      The examined esophagus was normal.      A small hiatal hernia was present. Surgically altered stomach consistent       with prior history of hemigastrectomy. 2 of 3 previously placed clips       remain at the anastomosis. Normal-appearing inferior limb.nly one limb       seen.No blood in the upper GI tract. Impression:               - Normal esophagus. surgically altered stomach.                            Previously placed hemostasis clips persist.                           - Small hiatal hernia.                           - No specimens collected. no suspicious bleeding                            lesion found. Hemoglobin remained stable at 9.6                            from yestday. I suspect lower GI tract etiology -  either hemorrhoids or diverticulosi Moderate Sedation:      Moderate (conscious) sedation was personally administered by an       anesthesia professional. The following parameters were monitored: oxygen       saturation, heart rate, blood pressure, respiratory rate, EKG, adequacy       of pulmonary ventilation, and response to care. Total physician       intraservice time was 18 minutes. Recommendation:           - Return patient to hospital ward for ongoing care.                            Close observation. Repeat H&H tomorrow morning. May                            or may not need further inpatient evaluation.                           -  Clear liquid diet.                           - Continue present medications. Repeat H&H tomorrow                            morning. No future MRI until clips gone                           - No repeat upper endoscopy.                           - Return to GI office (date not yet determined). Procedure Code(s):        --- Professional ---                           [phone], Esophagogastroduodenoscopy, flexible,                            transoral; diagnostic, including collection of                            specimen(s) by brushing or washing, when performed                            (separate procedure) Diagnosis Code(s):        --- Professional ---                           K44.9, Diaphragmatic hernia without obstruction or                            gangrene                           K92.1, Melena (includes Hematochezia) CPT copyright 2016 American Medical Association. All rights reserved. The codes documented in this report are preliminary and upon coder review may  be revised to meet current compliance requirements. Cristopher Estimable. Gala Romney, MD [facility]  Monserrate Blaschke, MD 05/11/2017 11:04:09 AM This report has been signed electronically. Number of Addenda: 0

## 2017-05-11 NOTE — Anesthesia Preprocedure Evaluation (Addendum)
Anesthesia Evaluation  Patient identified by MRN, date of birth, ID band Patient awake  General Assessment Comment:Not on a beta blocker  Reviewed: Allergy & Precautions, NPO status , Patient's Chart, lab work & pertinent test results  Airway Mallampati: I  TM Distance: >3 FB Neck ROM: Full    Dental  (+) Poor Dentition, Missing   Pulmonary Current Smoker,    breath sounds clear to auscultation       Cardiovascular hypertension, Pt. on medications  Rhythm:Regular Rate:Normal     Neuro/Psych    GI/Hepatic hiatal hernia, PUD, GERD  Medicated and Controlled,(+)     substance abuse  ,   Endo/Other    Renal/GU      Musculoskeletal  (+) Fibromyalgia -  Abdominal   Peds  Hematology  (+) Sickle cell trait ,   Anesthesia Other Findings   Reproductive/Obstetrics                            Anesthesia Physical Anesthesia Plan  ASA: III and emergent  Anesthesia Plan: MAC   Post-op Pain Management:    Induction:   PONV Risk Score and Plan:   Airway Management Planned: Simple Face Mask  Additional Equipment:   Intra-op Plan:   Post-operative Plan:   Informed Consent:   Plan Discussed with: Surgeon  Anesthesia Plan Comments:        Anesthesia Quick Evaluation

## 2017-05-11 NOTE — Anesthesia Postprocedure Evaluation (Signed)
Anesthesia Post Note  Patient: Angel Price  Procedure(s) Performed: ESOPHAGOGASTRODUODENOSCOPY (EGD) WITH PROPOFOL (N/A )  Patient location during evaluation: PACU Anesthesia Type: MAC Level of consciousness: awake and alert and oriented Pain management: pain level controlled Vital Signs Assessment: post-procedure vital signs reviewed and stable Respiratory status: spontaneous breathing Cardiovascular status: blood pressure returned to baseline Postop Assessment: no apparent nausea or vomiting Comments: Late entry     Last Vitals:  Vitals:   05/11/17 1100 05/11/17 1115  BP: (!) 131/96 124/86  Pulse: 67 73  Resp: 16 18  Temp:    SpO2: 96% 94%    Last Pain:  Vitals:   05/11/17 1040  TempSrc:   PainSc: Asleep                 Diara Chaudhari

## 2017-05-11 NOTE — Progress Notes (Signed)
Patient has been NPO since midnight.

## 2017-05-11 NOTE — Progress Notes (Signed)
Pt off floor for procedure

## 2017-05-11 NOTE — Progress Notes (Signed)
PROGRESS NOTE    Angel Price  CHY:[id number] DOB: Dec 16, 1965 DOA: 05/09/2017 PCP: Rosita Fire, MD   Brief Narrative:  Is a 52 year old female history of major depressive disorder, gastric bypass, chronic periumbilical fistulas, hypertension presented to the ED with rectal bleeding.  Patient seen by gastroenterology patient underwent upper endoscopy 05/11/2017 which was pretty unremarkable for any signs of bleeding.  H&H stable.   Assessment & Plan:   Principal Problem:   Acute GI bleeding Active Problems:   Bipolar disorder ([place])   Hypothyroid   Essential hypertension, benign   Rectal bleeding   MDD (major depressive disorder), recurrent episode, severe (HCC)   Acute blood loss anemia   Hypokalemia   Chronic wound infection of abdomen   HTN (hypertension)   Absolute anemia   Abdominal wall fistula   Folate deficiency  #1 rectal bleeding/GI Bleed Questionable etiology.  Concern for diverticular bleed versus hemorrhoidal bleed.  Hemoglobin was 9.6 from 11.8.  Patient states had some bleeding overnight.  Patient status post upper endoscopy per Dr. Buford Dresser 05/11/2017 that showed a normal esophagus, surgically altered previously placed hemostasis clips present.  Small ischial hernia.  No suspicious bleeding lesions found.  Change CBC to every 12 hours.  Patient started on clears.  Per GI.  2.  Acute blood loss anemia/folate deficiency Secondary to problem #1.  Hemoglobin currently at 9.6 from 11.8 on admission.  Hemoglobin seems to be stabilizing.  Transfusion threshold hemoglobin less than 7.  Change CBC to every 12 hours.  Folic acid levels are decreased.  Place on folic acid 1 mg daily.  Follow H&H.  3.  HTN Placed on IV enalapril until tolerating oral intake.  Follow.  4.  Hypokalemia/hypomagnesemia Replete.  5.  Depression No suicidal or h omicidal ideations.  Continue Lexapro.  6.  Chronic periumbilical inflammation with fistulous Does not look acutely infected.  Has  bandages.  Patient afebrile.  Normal white count.   DVT prophylaxis: SCDs Code Status: Full Family Communication: Updated patient.  No family at bedside. Disposition Plan: Likely home once bleeding has resolved hemoglobin stable and per gastroenterology.   Consultants:   Gastroenterology: Dr. Gala Romney 05/10/2017  Procedures:   CT abdomen and pelvis 05/10/2017  Upper endoscopy 05/11/2017  Antimicrobials:  None   Subjective: Patient just returned from upper endoscopy.  Patient waking up.  No chest pain or shortness of breath.  Patient stated she had some bloody bowel movements last night.  Objective: Vitals:   05/11/17 1040 05/11/17 1045 05/11/17 1100 05/11/17 1115  BP: (!) 147/96 (!) 148/99 (!) 131/96 124/86  Pulse: 72 71 67 73  Resp: 19 19 16 18   Temp: 98.8 F (37.1 C)     TempSrc:      SpO2: 100% 100% 96% 94%  Weight:      Height:        Intake/Output Summary (Last 24 hours) at 05/11/2017 1216 Last data filed at 05/11/2017 1044 Gross per 24 hour  Intake 1300 ml  Output 0 ml  Net 1300 ml   Filed Weights   05/09/17 2120 05/10/17 2353  Weight: 61.7 kg (136 lb) 65.4 kg (144 lb 2.9 oz)    Examination:  General exam: NAD Respiratory system: Clear to auscultation bilaterally.  No wheezes, no crackles, no rhonchi.Marland Kitchen Respiratory effort normal. Cardiovascular system: Regular rate and rhythm no murmurs rubs or gallops.  No JVD.  No lower extremity edema.  Gastrointestinal system: Abdomen is soft, nontender, nondistended, positive bowel sounds.  No organomegaly or  masses felt. Normal bowel sounds heard. Central nervous system: Alert and oriented. No focal neurological deficits. Extremities: Symmetric 5 x 5 power. Skin: No rashes, lesions or ulcers Psychiatry: Judgement and insight appear normal. Mood & affect appropriate.     Data Reviewed: I have personally reviewed following labs and imaging studies  CBC: Recent Labs  Lab 05/09/17 2225 05/10/17 0616  05/10/17 1403 05/10/17 2208 05/11/17 0711  WBC 4.5 3.8* 4.0 4.2 4.7  HGB 11.8* 9.6* 10.0* 9.6* 9.6*  HCT 35.4* 28.4* 29.6* 29.0* 29.0*  MCV 79.4 79.3 79.4 80.8 80.1  PLT 396 327 321 308 427   Basic Metabolic Panel: Recent Labs  Lab 05/09/17 2225 05/10/17 0616 05/11/17 0711  NA 145 139 140  K 3.0* 3.2* 3.5  CL 107 109 110  CO2 22 19* 22  GLUCOSE 95 89 89  BUN 6 8 <5*  CREATININE 0.52 0.52 0.55  CALCIUM 9.7 7.9* 8.9  MG  --  1.5* 1.7   GFR: Estimated Creatinine Clearance: 77.4 mL/min (by C-G formula based on SCr of 0.55 mg/dL). Liver Function Tests: Recent Labs  Lab 05/09/17 2225  AST 32  ALT 18  ALKPHOS 93  BILITOT 0.4  PROT 8.2*  ALBUMIN 4.1   No results for input(s): LIPASE, AMYLASE in the last 168 hours. No results for input(s): AMMONIA in the last 168 hours. Coagulation Profile: No results for input(s): INR, PROTIME in the last 168 hours. Cardiac Enzymes: No results for input(s): CKTOTAL, CKMB, CKMBINDEX, TROPONINI in the last 168 hours. BNP (last 3 results) No results for input(s): PROBNP in the last 8760 hours. HbA1C: No results for input(s): HGBA1C in the last 72 hours. CBG: No results for input(s): GLUCAP in the last 168 hours. Lipid Profile: No results for input(s): CHOL, HDL, LDLCALC, TRIG, CHOLHDL, LDLDIRECT in the last 72 hours. Thyroid Function Tests: No results for input(s): TSH, T4TOTAL, FREET4, T3FREE, THYROIDAB in the last 72 hours. Anemia Panel: Recent Labs    05/09/17 2225 05/10/17 0621  VITAMINB12 376  --   FOLATE  --  3.1*  FERRITIN 23  --   TIBC 340  --   IRON 32  --   RETICCTPCT  --  1.2   Sepsis Labs: No results for input(s): PROCALCITON, LATICACIDVEN in the last 168 hours.  Recent Results (from the past 240 hour(s))  MRSA PCR Screening     Status: None   Collection Time: 05/11/17 12:10 AM  Result Value Ref Range Status   MRSA by PCR NEGATIVE NEGATIVE Final    Comment:        The GeneXpert MRSA Assay (FDA approved for  NASAL specimens only), is one component of a comprehensive MRSA colonization surveillance program. It is not intended to diagnose MRSA infection nor to guide or monitor treatment for MRSA infections. Performed at [facility], [address], [place], [place] [address]          Radiology Studies: Ct Abdomen Pelvis Wo Contrast  Result Date: 05/10/2017 CLINICAL DATA:  Rectal bleeding with cutaneous fistula to the right of umbilicus EXAM: CT ABDOMEN AND PELVIS WITHOUT CONTRAST TECHNIQUE: Multidetector CT imaging of the abdomen and pelvis was performed following the standard protocol without IV contrast. COMPARISON:  CT 01/04/2017, 01/02/2017, 11/25/2016, 09/23/2016, 05/22/2016, 04/25/2016 FINDINGS: Lower chest: Lung bases demonstrate no acute consolidation or pleural effusion. Normal heart size. Coronary vascular calcification. Surgical clips around the GE junction Hepatobiliary: No focal hepatic abnormality. Surgical clips at the gallbladder fossa. Stable enlarged extrahepatic bile duct.  Pancreas: Unremarkable. No pancreatic ductal dilatation or surrounding inflammatory changes. Spleen: Normal in size without focal abnormality. Adrenals/Urinary Tract: Stable 2 cm left adrenal gland mass. The right adrenal gland is not seen. Kidneys show no hydronephrosis. The bladder is unremarkable Stomach/Bowel: The stomach is nonenlarged. Postsurgical changes of the stomach. No dilated small bowel. Surgical suture in the right lower quadrant small bowel with mild wall thickening here. Diffuse collapsed appearance of the colon. No rectal soft tissue thickening or surrounding inflammation. Vascular/Lymphatic: Moderate aortic atherosclerosis. No aneurysmal dilatation. No significantly enlarged lymph nodes Reproductive: Status post hysterectomy. No adnexal masses. Other: Negative for free air or free fluid. Irregular thick-walled rim enhancing fluid collection within the deep subcutaneous soft tissues of the midline  abdominal wall abutting the rectus sheath and musculature. This is contiguous with linear tracts leading to the skin surface and are consistent with fistulous. Decreased phlegmonous change in the right subcutaneous soft tissues near the umbilical region but now with tract leading to the skin consistent with fistula. Musculoskeletal: No acute or significant osseous findings. IMPRESSION: 1. No CT evidence for acute intra-abdominal or pelvic abnormality. Extensive postsurgical changes. No rectal wall thickening or perirectal inflammation 2. Chronic inflammatory process within the periumbilical region of the ventral abdominal wall with multiple cutaneous fistulous, the more inferior of which appear chronic. 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 Electronically Signed   By: Donavan Foil M.D.   On: 05/10/2017 03:00        Scheduled Meds: . enalaprilat  0.625 mg Intravenous Q6H  . escitalopram  20 mg Oral Daily  . folic acid  1 mg Oral Daily  . pantoprazole (PROTONIX) IV  40 mg Intravenous Q12H  . potassium chloride  40 mEq Oral Once   Continuous Infusions: . magnesium sulfate 1 - 4 g bolus IVPB    . sodium chloride 0.9 % 1,000 mL infusion Stopped (05/10/17 2043)     LOS: 1 day    Time spent: 35 mins    Irine Seal, MD [facility] Pager [phone]  If 7PM-7AM, please contact night-coverage [email or web address] Password [email or web address] 05/11/2017, 12:16 PM

## 2017-05-11 NOTE — Progress Notes (Signed)
Patient seen in short stay. States she has persistent rectal bleeding. Remains stable. Hemoglobin also stable at 9.6. Patient denies dysphagia. Diagnostic EGD this morning per plan. The risks, benefits, limitations, alternatives and imponderables have been reviewed with the patient. Potential for esophageal dilation, biopsy, etc. have also been reviewed.  Questions have been answered. All parties agreeable.

## 2017-05-11 NOTE — Transfer of Care (Signed)
Immediate Anesthesia Transfer of Care Note  Patient: Angel Price  Procedure(s) Performed: ESOPHAGOGASTRODUODENOSCOPY (EGD) WITH PROPOFOL (N/A )  Patient Location: PACU  Anesthesia Type:MAC  Level of Consciousness: awake and alert   Airway & Oxygen Therapy: Patient Spontanous Breathing  Post-op Assessment: Report given to RN and Post -op Vital signs reviewed and stable  Post vital signs: Reviewed and stable  Last Vitals:  Vitals:   05/11/17 1006 05/11/17 1040  BP: (!) 159/114 (!) (P) 147/96  Pulse: 91 (P) 72  Resp: 17 (P) 19  Temp:  (P) 37.1 C  SpO2: 96% (P) 100%    Last Pain:  Vitals:   05/11/17 0954  TempSrc: Oral  PainSc:          Complications: No apparent anesthesia complications

## 2017-05-12 ENCOUNTER — Encounter (HOSPITAL_COMMUNITY): Payer: Self-pay | Admitting: Internal Medicine

## 2017-05-12 DIAGNOSIS — E039 Hypothyroidism, unspecified: Secondary | ICD-10-CM

## 2017-05-12 LAB — CBC
HCT: 27.8 % — ABNORMAL LOW (ref 36.0–46.0)
HEMATOCRIT: 28.4 % — AB (ref 36.0–46.0)
HEMOGLOBIN: 9.3 g/dL — AB (ref 12.0–15.0)
Hemoglobin: 9 g/dL — ABNORMAL LOW (ref 12.0–15.0)
MCH: 26.7 pg (ref 26.0–34.0)
MCH: 26.5 pg (ref 26.0–34.0)
MCHC: 32.7 g/dL (ref 30.0–36.0)
MCHC: 32.4 g/dL (ref 30.0–36.0)
MCV: 81.6 fL (ref 78.0–100.0)
MCV: 82 fL (ref 78.0–100.0)
PLATELETS: 264 10*3/uL (ref 150–400)
Platelets: 278 10*3/uL (ref 150–400)
RBC: 3.48 MIL/uL — ABNORMAL LOW (ref 3.87–5.11)
RBC: 3.39 MIL/uL — ABNORMAL LOW (ref 3.87–5.11)
RDW: 19.1 % — ABNORMAL HIGH (ref 11.5–15.5)
RDW: 19 % — AB (ref 11.5–15.5)
WBC: 4.8 10*3/uL (ref 4.0–10.5)
WBC: 5.4 10*3/uL (ref 4.0–10.5)

## 2017-05-12 LAB — BASIC METABOLIC PANEL
Anion gap: 6 (ref 5–15)
CHLORIDE: 110 mmol/L (ref 101–111)
CO2: 23 mmol/L (ref 22–32)
CREATININE: 0.53 mg/dL (ref 0.44–1.00)
Calcium: 8.6 mg/dL — ABNORMAL LOW (ref 8.9–10.3)
GFR calc Af Amer: >60 mL/min (ref >60)
GFR calc non Af Amer: >60 mL/min (ref >60)
Glucose, Bld: 89 mg/dL (ref 65–99)
POTASSIUM: 3.8 mmol/L (ref 3.5–5.1)
SODIUM: 139 mmol/L (ref 135–145)

## 2017-05-12 LAB — MAGNESIUM: Magnesium: 1.8 mg/dL (ref 1.7–2.4)

## 2017-05-12 MED ORDER — MAGNESIUM SULFATE 4 GM/100ML IV SOLN
4.0000 g | Freq: Once | INTRAVENOUS | Status: AC
  Administered 2017-05-12: 4 g via INTRAVENOUS
  Filled 2017-05-12: qty 100

## 2017-05-12 MED ORDER — HYDROCORTISONE 2.5 % RE CREA: TOPICAL_CREAM | Freq: Four times a day (QID) | RECTAL | Status: DC

## 2017-05-12 MED ORDER — AMLODIPINE BESYLATE 5 MG PO TABS
5.0000 mg | ORAL_TABLET | Freq: Every day | ORAL | Status: DC
  Administered 2017-05-13 – 2017-05-23 (×11): 5 mg via ORAL
  Filled 2017-05-12 (×12): qty 1

## 2017-05-12 MED ORDER — HYDROCORTISONE 2.5 % RE CREA
TOPICAL_CREAM | Freq: Two times a day (BID) | RECTAL | Status: DC
  Administered 2017-05-12: 15:00:00 via RECTAL
  Filled 2017-05-12: qty 28.35

## 2017-05-12 MED ORDER — HYDROCORTISONE 2.5 % RE CREA
TOPICAL_CREAM | Freq: Four times a day (QID) | RECTAL | Status: DC
  Administered 2017-05-12: 1 via RECTAL
  Administered 2017-05-12: 18:00:00 via RECTAL
  Administered 2017-05-13: 1 via RECTAL
  Administered 2017-05-13 – 2017-05-15 (×8): via RECTAL
  Administered 2017-05-16 (×3): 1 via RECTAL
  Administered 2017-05-16 – 2017-05-18 (×5): via RECTAL
  Administered 2017-05-18: 1 via RECTAL
  Administered 2017-05-18 – 2017-05-19 (×6): via RECTAL
  Administered 2017-05-20 (×2): 1 via RECTAL
  Administered 2017-05-20 – 2017-05-21 (×5): via RECTAL
  Administered 2017-05-22: 1 via RECTAL
  Administered 2017-05-22 (×2): via RECTAL
  Filled 2017-05-12: qty 28.35

## 2017-05-12 MED ORDER — LISINOPRIL 10 MG PO TABS
10.0000 mg | ORAL_TABLET | Freq: Every day | ORAL | Status: DC
  Administered 2017-05-13: 10 mg via ORAL
  Filled 2017-05-12: qty 1

## 2017-05-12 NOTE — Progress Notes (Signed)
    Subjective: No N/V. Wants to eat. Notes several episodes of bright red blood per rectum.   Objective: Vital signs in last 24 hours: Temp:  [98.8 F (37.1 C)] 98.8 F (37.1 C) (02/24 1342) Pulse Rate:  [67-91] 83 (02/25 0010) Resp:  [16-19] 18 (02/24 1342) BP: (100-160)/(56-114) 100/56 (02/25 0010) SpO2:  [94 %-100 %] 100 % (02/24 2128) Last BM Date: 05/11/17 General:   Alert and oriented, pleasant Head:  Normocephalic and atraumatic. Eyes:  No icterus, sclera clear. Conjuctiva pink.  Abdomen:  Bowel sounds present, soft, TTP upper abdomen, non-distended.Abdominal pad placed over abdomen with history of abdominal wall fistulas Neurologic:  Alert and  oriented x4 Psych:  Alert and cooperative. Normal mood and affect.  Intake/Output from previous day: 02/24 0701 - 02/25 0700 In: 300 [I.V.:300] Out: 0  Intake/Output this shift: No intake/output data recorded.  Lab Results: Recent Labs    05/11/17 0711 05/11/17 1915 05/12/17 0554  WBC 4.7 4.8 4.8  HGB 9.6* 9.3* 9.3*  HCT 29.0* 28.4* 28.4*  PLT 316 284 278   BMET Recent Labs    05/10/17 0616 05/11/17 0711 05/12/17 0554  NA 139 140 139  K 3.2* 3.5 3.8  CL 109 110 110  CO2 19* 22 23  GLUCOSE 89 89 89  BUN 8 <5* <5*  CREATININE 0.52 0.55 0.53  CALCIUM 7.9* 8.9 8.6*   LFT Recent Labs    05/09/17 2225  PROT 8.2*  ALBUMIN 4.1  AST 32  ALT 18  ALKPHOS 60  BILITOT 0.4    Assessment: 52 year old female with complicated past medical history to include pancreatitis, history of ETOH abuse, prior BIllroth II, anemia of chronic disease, gastroparesis, GERD, chronic abdominal pain. 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 Last colonoscopy in June 2018 with normal TI, multiple diverticula, large non-bleeding internal hemorrhoids with surgical eval for hemorrhoidectomy recommended. EGD yesterday with normal  esophagus, previous placed hemostasis clips remained in place. No specimens collected.   She still notes intermittent low-volume hematochezia. Does not seem typical of a diverticular bleed. Chronic anemia noted historically, and her Hgb appears around baseline. CT without contrast this admission without acute findings. Likely hematochezia secondary to benign anorectal source. Add Anusol per rectum BID.   Plan: Add Anusol BID Previously has been recommended to see surgery for hemorrhoidectomy: continue to monitor for acute blood loss anemia. Appears stable from multiple past results on file.  Patient requesting advancement of diet: soft diet ordered Follow CBC  Annitta Needs, PhD, ANP-BC [facility]    LOS: 2 days    05/12/2017, 7:55 AM

## 2017-05-12 NOTE — Progress Notes (Signed)
PROGRESS NOTE    EMMY KENG  DDU:[id number] DOB: [birth year] DOA: 05/09/2017 PCP: Rosita Fire, MD   Brief Narrative:  Is a 52 year old female history of major depressive disorder, gastric bypass, chronic periumbilical fistulas, hypertension presented to the ED with rectal bleeding.  Patient seen by gastroenterology patient underwent upper endoscopy 05/11/2017 which was pretty unremarkable for any signs of bleeding.  H&H stable.   Assessment & Plan:   Principal Problem:   Acute GI bleeding Active Problems:   Bipolar disorder ([place])   Hypothyroid   Essential hypertension, benign   Rectal bleeding   MDD (major depressive disorder), recurrent episode, severe (HCC)   Acute blood loss anemia   Hypokalemia   Chronic wound infection of abdomen   HTN (hypertension)   Absolute anemia   Abdominal wall fistula   Folate deficiency  #1 rectal bleeding/GI Bleed Questionable etiology.  Concern for diverticular bleed versus hemorrhoidal bleed.  Hemoglobin was 9.6 from 11.8.  Patient states had some bleeding overnight and early this morning.  Patient status post upper endoscopy per Dr. Buford Dresser 05/11/2017 that showed a normal esophagus, surgically altered previously placed hemostasis clips present.  Small ischial hernia.  No suspicious bleeding lesions found.  H&H seems to be stable.  Patient still complaining of bleeding.  Patient tolerating clears.  Diet advanced to a soft diet per GI.  GI following and appreciate input and recommendations.   2.  Acute blood loss anemia/folate deficiency Secondary to problem #1.  Hemoglobin currently at 9.3 from 9.6 from 11.8 on admission.  Patient states that there was some bleeding overnight and this morning.  Hemoglobin seems to be stabilizing.  Pete H&H this afternoon.  Transfusion threshold hemoglobin less than 7.  Folic acid levels are decreased.  Place on folic acid 1 mg daily.  Follow H&H.  3.  HTN Blood pressure improved on IV enalapril.  Will  discontinue IV enalapril and place back on home regimen of lisinopril and Norvasc.  4.  Hypokalemia/hypomagnesemia Replete.  5.  Depression Stable.  Continue Lexapro.  6.  Chronic periumbilical inflammation with fistulous Does not look acutely infected.  Has bandages.  Patient afebrile.  Normal white count.  Outpatient follow-up.   DVT prophylaxis: SCDs Code Status: Full Family Communication: Updated patient.  No family at bedside. Disposition Plan: Likely home once bleeding has resolved hemoglobin stable and per gastroenterology.   Consultants:   Gastroenterology: Dr. Gala Romney 05/10/2017  Procedures:   CT abdomen and pelvis 05/10/2017  Upper endoscopy 05/11/2017  Antimicrobials:  None   Subjective: Patient laying in bed.  Patient states had some bloody bowel movements last night and early this morning.  No chest pain or shortness of breath.  No change in chronic abdominal pain.   Objective: Vitals:   05/11/17 1342 05/11/17 2031 05/11/17 2128 05/12/17 0010  BP: (!) 141/91  (!) 153/93 (!) 100/56  Pulse: 75  81 83  Resp: 18     Temp: 98.8 F (37.1 C)     TempSrc: Oral     SpO2: 95% 96% 100%   Weight:      Height:       No intake or output data in the 24 hours ending 05/12/17 1240 Filed Weights   05/09/17 2120 05/10/17 2353  Weight: 61.7 kg (136 lb) 65.4 kg (144 lb 2.9 oz)    Examination:  General exam: NAD Respiratory system: Clear to auscultation bilaterally.  No crackles, no rhonchi, no wheezing.  Normal respiratory effort. Cardiovascular system: Regular rate and  rhythm no murmurs rubs or gallops.  No JVD.  No lower extremity edema.  Gastrointestinal system: Abdomen is soft, some diffuse tenderness unchanged, nondistended, positive bowel sounds.  No organomegaly or masses felt. Normal bowel sounds heard. Central nervous system: Alert and oriented. No focal neurological deficits. Extremities: Symmetric 5 x 5 power. Skin: No rashes, lesions or  ulcers Psychiatry: Judgement and insight appear normal. Mood & affect appropriate.     Data Reviewed: I have personally reviewed following labs and imaging studies  CBC: Recent Labs  Lab 05/10/17 1403 05/10/17 2208 05/11/17 0711 05/11/17 1915 05/12/17 0554  WBC 4.0 4.2 4.7 4.8 4.8  HGB 10.0* 9.6* 9.6* 9.3* 9.3*  HCT 29.6* 29.0* 29.0* 28.4* 28.4*  MCV 79.4 80.8 80.1 81.1 81.6  PLT 321 308 316 284 491   Basic Metabolic Panel: Recent Labs  Lab 05/09/17 2225 05/10/17 0616 05/11/17 0711 05/12/17 0554  NA 145 139 140 139  K 3.0* 3.2* 3.5 3.8  CL 107 109 110 110  CO2 22 19* 22 23  GLUCOSE 95 89 89 89  BUN 6 8 <5* <5*  CREATININE 0.52 0.52 0.55 0.53  CALCIUM 9.7 7.9* 8.9 8.6*  MG  --  1.5* 1.7 1.8   GFR: Estimated Creatinine Clearance: 77.4 mL/min (by C-G formula based on SCr of 0.53 mg/dL). Liver Function Tests: Recent Labs  Lab 05/09/17 2225  AST 32  ALT 18  ALKPHOS 93  BILITOT 0.4  PROT 8.2*  ALBUMIN 4.1   No results for input(s): LIPASE, AMYLASE in the last 168 hours. No results for input(s): AMMONIA in the last 168 hours. Coagulation Profile: No results for input(s): INR, PROTIME in the last 168 hours. Cardiac Enzymes: No results for input(s): CKTOTAL, CKMB, CKMBINDEX, TROPONINI in the last 168 hours. BNP (last 3 results) No results for input(s): PROBNP in the last 8760 hours. HbA1C: No results for input(s): HGBA1C in the last 72 hours. CBG: No results for input(s): GLUCAP in the last 168 hours. Lipid Profile: No results for input(s): CHOL, HDL, LDLCALC, TRIG, CHOLHDL, LDLDIRECT in the last 72 hours. Thyroid Function Tests: No results for input(s): TSH, T4TOTAL, FREET4, T3FREE, THYROIDAB in the last 72 hours. Anemia Panel: Recent Labs    05/09/17 2225 05/10/17 0621  VITAMINB12 376  --   FOLATE  --  3.1*  FERRITIN 23  --   TIBC 340  --   IRON 32  --   RETICCTPCT  --  1.2   Sepsis Labs: No results for input(s): PROCALCITON, LATICACIDVEN in  the last 168 hours.  Recent Results (from the past 240 hour(s))  MRSA PCR Screening     Status: None   Collection Time: 05/11/17 12:10 AM  Result Value Ref Range Status   MRSA by PCR NEGATIVE NEGATIVE Final    Comment:        The GeneXpert MRSA Assay (FDA approved for NASAL specimens only), is one component of a comprehensive MRSA colonization surveillance program. It is not intended to diagnose MRSA infection nor to guide or monitor treatment for MRSA infections. Performed at [facility], [address] [place], [place] [address]          Radiology Studies: No results found.      Scheduled Meds: . enalaprilat  0.625 mg Intravenous Q6H  . escitalopram  20 mg Oral Daily  . folic acid  1 mg Oral Daily  . hydrocortisone   Rectal BID  . pantoprazole (PROTONIX) IV  40 mg Intravenous Q12H  Continuous Infusions:    LOS: 2 days    Time spent: 35 mins    Irine Seal, MD [facility] Pager [phone]  If 7PM-7AM, please contact night-coverage [email or web address] Password [facility] 05/12/2017, 12:40 PM

## 2017-05-12 NOTE — Anesthesia Postprocedure Evaluation (Signed)
Anesthesia Post Note  Patient: SAHIRAH RUDELL  Procedure(s) Performed: ESOPHAGOGASTRODUODENOSCOPY (EGD) WITH PROPOFOL (N/A )  Patient location during evaluation: PACU Anesthesia Type: MAC Level of consciousness: awake and alert and oriented Pain management: pain level controlled Vital Signs Assessment: post-procedure vital signs reviewed and stable Respiratory status: spontaneous breathing Cardiovascular status: blood pressure returned to baseline Postop Assessment: no apparent nausea or vomiting Anesthetic complications: no     Last Vitals:  Vitals:   05/11/17 2128 05/12/17 0010  BP: (!) 153/93 (!) 100/56  Pulse: 81 83  Resp:    Temp:    SpO2: 100%     Last Pain:  Vitals:   05/12/17 1219  TempSrc:   PainSc: 3                  Roanne Haye

## 2017-05-12 NOTE — Progress Notes (Signed)
Patient states she is having bloody stools, MD notified.

## 2017-05-12 NOTE — Addendum Note (Signed)
Addendum  created 05/12/17 1403 by Ollen Bowl, CRNA   Sign clinical note

## 2017-05-12 NOTE — Progress Notes (Signed)
Patient called this nurse to her room, patient held out napkin with small clots of blood, patient states she spit them up. Will notify MD.

## 2017-05-12 NOTE — Progress Notes (Signed)
Noted small amount of bright red blood in patient stool. Small clots noted. MD notified. Dr. Oneida Alar notified.

## 2017-05-13 LAB — BASIC METABOLIC PANEL
Anion gap: 10 (ref 5–15)
BUN: 5 mg/dL — AB (ref 6–20)
CO2: 22 mmol/L (ref 22–32)
CREATININE: 0.46 mg/dL (ref 0.44–1.00)
Calcium: 8.5 mg/dL — ABNORMAL LOW (ref 8.9–10.3)
Chloride: 106 mmol/L (ref 101–111)
Glucose, Bld: 99 mg/dL (ref 65–99)
POTASSIUM: 3.4 mmol/L — AB (ref 3.5–5.1)
SODIUM: 138 mmol/L (ref 135–145)

## 2017-05-13 LAB — CBC
HCT: 29.2 % — ABNORMAL LOW (ref 36.0–46.0)
Hemoglobin: 9.6 g/dL — ABNORMAL LOW (ref 12.0–15.0)
MCH: 27 pg (ref 26.0–34.0)
MCHC: 32.9 g/dL (ref 30.0–36.0)
MCV: 82 fL (ref 78.0–100.0)
PLATELETS: 251 10*3/uL (ref 150–400)
RBC: 3.56 MIL/uL — AB (ref 3.87–5.11)
RDW: 18.9 % — ABNORMAL HIGH (ref 11.5–15.5)
WBC: 5.3 10*3/uL (ref 4.0–10.5)

## 2017-05-13 LAB — HEPATIC FUNCTION PANEL
ALT: 20 U/L (ref 14–54)
AST: 37 U/L (ref 15–41)
Albumin: 3.1 g/dL — ABNORMAL LOW (ref 3.5–5.0)
Alkaline Phosphatase: 83 U/L (ref 38–126)
BILIRUBIN INDIRECT: 0.3 mg/dL (ref 0.3–0.9)
Bilirubin, Direct: 0.1 mg/dL (ref 0.1–0.5)
TOTAL PROTEIN: 6.3 g/dL — AB (ref 6.5–8.1)
Total Bilirubin: 0.4 mg/dL (ref 0.3–1.2)

## 2017-05-13 LAB — LIPASE, BLOOD: LIPASE: 19 U/L (ref 11–51)

## 2017-05-13 LAB — MAGNESIUM: MAGNESIUM: 1.6 mg/dL — AB (ref 1.7–2.4)

## 2017-05-13 MED ORDER — DIPHENHYDRAMINE HCL 25 MG PO CAPS
25.0000 mg | ORAL_CAPSULE | Freq: Every evening | ORAL | Status: DC | PRN
  Administered 2017-05-13 – 2017-05-22 (×8): 25 mg via ORAL
  Filled 2017-05-13 (×8): qty 1

## 2017-05-13 MED ORDER — MAGNESIUM SULFATE 4 GM/100ML IV SOLN
4.0000 g | Freq: Once | INTRAVENOUS | Status: AC
  Administered 2017-05-13: 4 g via INTRAVENOUS
  Filled 2017-05-13: qty 100

## 2017-05-13 MED ORDER — LISINOPRIL 10 MG PO TABS
20.0000 mg | ORAL_TABLET | Freq: Every day | ORAL | Status: DC
  Administered 2017-05-14 – 2017-05-23 (×10): 20 mg via ORAL
  Filled 2017-05-13 (×10): qty 2

## 2017-05-13 MED ORDER — PANTOPRAZOLE SODIUM 40 MG PO TBEC
40.0000 mg | DELAYED_RELEASE_TABLET | Freq: Two times a day (BID) | ORAL | Status: DC
  Administered 2017-05-13 – 2017-05-17 (×8): 40 mg via ORAL
  Filled 2017-05-13 (×8): qty 1

## 2017-05-13 NOTE — Progress Notes (Signed)
PROGRESS NOTE    Angel Price  ENI:[id number] DOB: 1965/08/02 DOA: 05/09/2017 PCP: Rosita Fire, MD   Brief Narrative:  Is a 52 year old female history of major depressive disorder, gastric bypass, chronic periumbilical fistulas, hypertension presented to the ED with rectal bleeding.  Patient seen by gastroenterology patient underwent upper endoscopy 05/11/2017 which was pretty unremarkable for any signs of bleeding.  H&H stable.   Assessment & Plan:   Principal Problem:   Acute GI bleeding Active Problems:   Bipolar disorder ([place])   Hypothyroid   Essential hypertension, benign   Rectal bleeding   MDD (major depressive disorder), recurrent episode, severe (HCC)   Acute blood loss anemia   Hypokalemia   Chronic wound infection of abdomen   HTN (hypertension)   Absolute anemia   Abdominal wall fistula   Folate deficiency  #1 rectal bleeding/LGI Bleed Questionable etiology.  Concern for diverticular bleed versus hemorrhoidal bleed.  Hemoglobin was 9.6 from 11.8.  Patient states had some bleeding overnight and early this morning occurring since admission.  Patient status post upper endoscopy per Dr. Buford Dresser 05/11/2017 that showed a normal esophagus, surgically altered previously placed hemostasis clips present.  Small ischial hernia.  No suspicious bleeding lesions found.  H&H seems to be stable.  Patient still complaining of bleeding.  Patient tolerating soft diet.  Patient being followed by GI review patient's bright red blood per rectum likely hemorrhoidal in nature.  Patient has been placed on Anusol cream.  Per GI if continued bleeding will likely need to consult with general surgery for further evaluation patient was to follow-up with general surgery in the outpatient setting however never followed up.  GI following and appreciate their input and recommendations.    2.  Acute blood loss anemia/folate deficiency Secondary to problem #1.  Hemoglobin currently at 9.6 from 9.3 from  9.6 from 11.8 on admission.  Patient states that there was some bleeding overnight and this morning.  Hemoglobin seems to be stabilizing.  Per GI likely hemorrhoidal bleeding.  Transfusion threshold hemoglobin less than 7.  Folic acid levels are decreased.  Continue folic acid supplementation.    3.  HTN Blood pressure improved on IV enalapril.  IV enalapril has been discontinued and patient now on home regimen Norvasc as well as lisinopril.  Increase lisinopril to 20 mg daily.  Follow.    4.  Hypokalemia/hypomagnesemia Likely secondary to GI losses.  Replete.   5.  Depression No suicidal ideation.  No homicidal ideation.  Continue home regimen Lexapro.   6.  Chronic periumbilical inflammation with fistulous Does not look acutely infected.  Has bandages.  Patient afebrile.  Normal white count.  Outpatient follow-up.   DVT prophylaxis: SCDs Code Status: Full Family Communication: Updated patient.  No family at bedside. Disposition Plan: Likely home once bleeding has resolved hemoglobin stable and per gastroenterology.   Consultants:   Gastroenterology: Dr. Gala Romney 05/10/2017  Procedures:   CT abdomen and pelvis 05/10/2017  Upper endoscopy 05/11/2017  Antimicrobials:  None   Subjective: Patient states still having some bright red blood per rectum overnight and this morning.  Denies any chest pain or shortness of breath.   Objective: Vitals:   05/12/17 0010 05/12/17 1522 05/12/17 2100 05/13/17 0500  BP: (!) 100/56 137/89 (!) 151/101 (!) 145/98  Pulse: 83 71 79 74  Resp:  16 16 16   Temp:  98.8 F (37.1 C) 98.6 F (37 C) 98.6 F (37 C)  TempSrc:  Oral Oral Oral  SpO2:  100%  100% 97%  Weight:      Height:        Intake/Output Summary (Last 24 hours) at 05/13/2017 1246 Last data filed at 05/13/2017 0900 Gross per 24 hour  Intake 1920 ml  Output -  Net 1920 ml   Filed Weights   05/09/17 2120 05/10/17 2353  Weight: 61.7 kg (136 lb) 65.4 kg (144 lb 2.9 oz)     Examination:  General exam: NAD Respiratory system: Lungs clear to auscultation bilaterally.  No wheezes, no crackles, no rhonchi.  Normal respiratory effort.  Cardiovascular system: RRR. No m/r/g.no JVD.  No lower extremity edema.  Gastrointestinal system: Abdomen is nondistended, chronic diffuse tenderness unchanged, soft, positive bowel sounds.  No rebound.  No guarding. Central nervous system: Alert and oriented. No focal neurological deficits. Extremities: Symmetric 5 x 5 power. Skin: No rashes, lesions or ulcers Psychiatry: Judgement and insight appear normal. Mood & affect appropriate.     Data Reviewed: I have personally reviewed following labs and imaging studies  CBC: Recent Labs  Lab 05/11/17 0711 05/11/17 1915 05/12/17 0554 05/12/17 2307 05/13/17 1047  WBC 4.7 4.8 4.8 5.4 5.3  HGB 9.6* 9.3* 9.3* 9.0* 9.6*  HCT 29.0* 28.4* 28.4* 27.8* 29.2*  MCV 80.1 81.1 81.6 82.0 82.0  PLT 316 284 278 264 191   Basic Metabolic Panel: Recent Labs  Lab 05/09/17 2225 05/10/17 0616 05/11/17 0711 05/12/17 0554 05/13/17 0652  NA 145 139 140 139 138  K 3.0* 3.2* 3.5 3.8 3.4*  CL 107 109 110 110 106  CO2 22 19* 22 23 22   GLUCOSE 95 89 89 89 99  BUN 6 8 <5* <5* 5*  CREATININE 0.52 0.52 0.55 0.53 0.46  CALCIUM 9.7 7.9* 8.9 8.6* 8.5*  MG  --  1.5* 1.7 1.8 1.6*   GFR: Estimated Creatinine Clearance: 77.4 mL/min (by C-G formula based on SCr of 0.46 mg/dL). Liver Function Tests: Recent Labs  Lab 05/09/17 2225  AST 32  ALT 18  ALKPHOS 93  BILITOT 0.4  PROT 8.2*  ALBUMIN 4.1   No results for input(s): LIPASE, AMYLASE in the last 168 hours. No results for input(s): AMMONIA in the last 168 hours. Coagulation Profile: No results for input(s): INR, PROTIME in the last 168 hours. Cardiac Enzymes: No results for input(s): CKTOTAL, CKMB, CKMBINDEX, TROPONINI in the last 168 hours. BNP (last 3 results) No results for input(s): PROBNP in the last 8760 hours. HbA1C: No  results for input(s): HGBA1C in the last 72 hours. CBG: No results for input(s): GLUCAP in the last 168 hours. Lipid Profile: No results for input(s): CHOL, HDL, LDLCALC, TRIG, CHOLHDL, LDLDIRECT in the last 72 hours. Thyroid Function Tests: No results for input(s): TSH, T4TOTAL, FREET4, T3FREE, THYROIDAB in the last 72 hours. Anemia Panel: No results for input(s): VITAMINB12, FOLATE, FERRITIN, TIBC, IRON, RETICCTPCT in the last 72 hours. Sepsis Labs: No results for input(s): PROCALCITON, LATICACIDVEN in the last 168 hours.  Recent Results (from the past 240 hour(s))  MRSA PCR Screening     Status: None   Collection Time: 05/11/17 12:10 AM  Result Value Ref Range Status   MRSA by PCR NEGATIVE NEGATIVE Final    Comment:        The GeneXpert MRSA Assay (FDA approved for NASAL specimens only), is one component of a comprehensive MRSA colonization surveillance program. It is not intended to diagnose MRSA infection nor to guide or monitor treatment for MRSA infections. Performed at [facility], [address]  [place], [place] [address]          Radiology Studies: No results found.      Scheduled Meds: . amLODipine  5 mg Oral Daily  . escitalopram  20 mg Oral Daily  . folic acid  1 mg Oral Daily  . hydrocortisone   Rectal QID  . lisinopril  10 mg Oral Daily  . pantoprazole (PROTONIX) IV  40 mg Intravenous Q12H   Continuous Infusions: . magnesium sulfate 1 - 4 g bolus IVPB       LOS: 3 days    Time spent: 35 mins    Irine Seal, MD [facility] Pager [phone]  If 7PM-7AM, please contact night-coverage [email or web address] Password [facility] 05/13/2017, 12:46 PM

## 2017-05-13 NOTE — Progress Notes (Signed)
    Subjective: Reports intermittent rectal bleeding. Notes abdominal pain. Ate meatloaf and potatoes yesterday evening without difficulty.   Objective: Vital signs in last 24 hours: Temp:  [98.6 F (37 C)-98.8 F (37.1 C)] 98.6 F (37 C) (02/26 0500) Pulse Rate:  [71-79] 74 (02/26 0500) Resp:  [16] 16 (02/26 0500) BP: (137-151)/(89-101) 145/98 (02/26 0500) SpO2:  [97 %-100 %] 97 % (02/26 0500) Last BM Date: 05/13/17 General:   Alert and oriented, flat affect  Head:  Normocephalic and atraumatic. Abdomen:  Bowel sounds present, soft, TTP epigastric without rebound Extremities:  Without edema. Neurologic:  Alert and  oriented x4   Intake/Output from previous day: 02/25 0701 - 02/26 0700 In: 1800 [P.O.:1800] Out: -  Intake/Output this shift: Total I/O In: 360 [P.O.:360] Out: -   Lab Results: Recent Labs    05/12/17 0554 05/12/17 2307 05/13/17 1047  WBC 4.8 5.4 5.3  HGB 9.3* 9.0* 9.6*  HCT 28.4* 27.8* 29.2*  PLT 278 264 251   BMET Recent Labs    05/11/17 0711 05/12/17 0554 05/13/17 0652  NA 140 139 138  K 3.5 3.8 3.4*  CL 110 110 106  CO2 22 23 22   GLUCOSE 89 89 99  BUN <5* <5* 5*  CREATININE 0.55 0.53 0.46  CALCIUM 8.9 8.6* 8.5*    Assessment: 52 year old female with complicated past medical history to include pancreatitis, history of ETOH abuse, prior BIllroth II, anemia of chronic disease, gastroparesis, GERD, chronic abdominal pain. 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 Last colonoscopy in June 2018 with normal TI, multiple diverticula, large non-bleeding internal hemorrhoids with surgical eval for hemorrhoidectomy recommended. EGD this admission with normal esophagus, previous placed hemostasis clips remained in place. No specimens collected.   She is known well to me from outpatient visits, and she has a history of chronic abdominal pain.  Known abdominal wall multiple cutaneous fistulas. CT without contrast on 2/23 without acute findings. Abdominal pain multifactorial. Unclear if she has an element of chronic pancreatitis (multiple imaging on file but no EUS undertaken as felt straightforward and secondary to ETOH use historically). Check lipase, HFP.   Rectal bleeding: known hemorrhoids. Hgb actually improved. Doubt dealing with stuttering diverticular bleed. Would recommend outpatient evaluation with Dr. Arnoldo Morale for consideration of hemorrhoid repair.   Plan: Lipase, HFP Anusol QID Protonix BID Currently on a soft diet: if unable to advance may need CT with contrast as CT on file is without contrast. Would like to avoid unnecessary imaging as she has multiple CTs on file  Annitta Needs, PhD, ANP-BC [facility]      Annitta Needs, PhD, ANP-BC [facility]     LOS: 3 days    05/13/2017, 12:51 PM

## 2017-05-14 ENCOUNTER — Inpatient Hospital Stay (HOSPITAL_COMMUNITY): Payer: Medicare Other

## 2017-05-14 DIAGNOSIS — D649 Anemia, unspecified: Secondary | ICD-10-CM

## 2017-05-14 LAB — CBC
HCT: 30.2 % — ABNORMAL LOW (ref 36.0–46.0)
Hemoglobin: 9.7 g/dL — ABNORMAL LOW (ref 12.0–15.0)
MCH: 26.6 pg (ref 26.0–34.0)
MCHC: 32.1 g/dL (ref 30.0–36.0)
MCV: 83 fL (ref 78.0–100.0)
PLATELETS: 244 10*3/uL (ref 150–400)
RBC: 3.64 MIL/uL — ABNORMAL LOW (ref 3.87–5.11)
RDW: 19.2 % — AB (ref 11.5–15.5)
WBC: 5.9 10*3/uL (ref 4.0–10.5)

## 2017-05-14 LAB — BASIC METABOLIC PANEL
Anion gap: 10 (ref 5–15)
BUN: 6 mg/dL (ref 6–20)
CALCIUM: 9 mg/dL (ref 8.9–10.3)
CO2: 23 mmol/L (ref 22–32)
CREATININE: 0.57 mg/dL (ref 0.44–1.00)
Chloride: 106 mmol/L (ref 101–111)
GFR calc Af Amer: >60 mL/min (ref >60)
GLUCOSE: 141 mg/dL — AB (ref 65–99)
Potassium: 3.4 mmol/L — ABNORMAL LOW (ref 3.5–5.1)
Sodium: 139 mmol/L (ref 135–145)

## 2017-05-14 LAB — MAGNESIUM: Magnesium: 1.6 mg/dL — ABNORMAL LOW (ref 1.7–2.4)

## 2017-05-14 MED ORDER — MAGNESIUM SULFATE 4 GM/100ML IV SOLN
4.0000 g | Freq: Once | INTRAVENOUS | Status: AC
  Administered 2017-05-14: 4 g via INTRAVENOUS
  Filled 2017-05-14: qty 100
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
## 2017-05-14 NOTE — Progress Notes (Addendum)
PROGRESS NOTE    [name]  BDZ:[id number] DOB: [birth year] DOA: 05/09/2017 PCP: Rosita Fire, MD   Brief Narrative:  52 year old female history of major depressive disorder, gastric bypass, chronic periumbilical fistulas, hypertension presented to the ED with rectal bleeding.  Patient seen by gastroenterology patient underwent upper endoscopy 05/11/2017 which was pretty unremarkable for any signs of bleeding.  H&H stable. Complains of coughing up blood.  Assessment & Plan   Rectal bleeding/GI bleed/acute blood loss anemia/folate deficiency -Questionable etiology, likely hemorrhoidal bleed -Hemoglobin currently 9.7 -Patient states she has noted some dark maroon colored blood per rectum however not during a bowel movement -Gastroenterology consulted and appreciated -Patient did have endoscopy on 05/11/2017: Showing normal esophagus, surgically altered previous placed hemostasis clips present.  Small issue hernia.  No suspicious bleeding lesions found. -Discussed with gastroenterology, if patient continues to have bleeding, would consult surgery. -Continue to monitor CBC -Continue folic acid replacement -Patient complained of coughing up blood overnight, however nursing has not seen this. Have asked patient to save this and told her to nurse if it occurs again. Will obtain CXR  Essential hypertension -Continue lisinopril, amlodipine -Blood pressures appear to be stable  Hypokalemia/hypomagnesemia -Potassium currently 3.4, magnesium 1.6 -We will replace with IV supplementation and continue to monitor closely  Depression -Continue Lexapro  Chronic periumbilical inflammation with fistulous -Currently does not appear to be acutely infected, dressings are in place -Patient afebrile no leukocytosis -Continue outpatient follow-up  DVT Prophylaxis  SCDs  Code Status: Full  Family Communication: None at bedside  Disposition Plan: Admitted, continue to monitor CBC. Likely  discharge within the next 24-48 hrs  Consultants Gastroenterology  Procedures  EGD  Antibiotics   Anti-infectives (From admission, onward)   None      Subjective:   Angel Price seen and examined today.  Denies current chest pain, shortness of breath, abdominal pain, nausea or vomiting, diarrhea constipation.  Patient states she has not seen any further bloody bowel movements although has had some maroon colored clots.  She also states that she has been coughing up blood yesterday evening.  Objective:   Vitals:   05/12/17 2100 05/13/17 0500 05/13/17 1400 05/14/17 0300  BP: (!) 151/101 (!) 145/98 134/85 122/81  Pulse: 79 74 88 72  Resp: 16 16 18 18   Temp: 98.6 F (37 C) 98.6 F (37 C) 98.1 F (36.7 C) 97.9 F (36.6 C)  TempSrc: Oral Oral  Oral  SpO2: 100% 97% 98% 100%  Weight:      Height:        Intake/Output Summary (Last 24 hours) at 05/14/2017 1306 Last data filed at 05/14/2017 0600 Gross per 24 hour  Intake 960 ml  Output -  Net 960 ml   Filed Weights   05/09/17 2120 05/10/17 2353  Weight: 61.7 kg (136 lb) 65.4 kg (144 lb 2.9 oz)    Exam  General: Well developed, well nourished, NAD, appears stated age  HEENT: NCAT, mucous membranes moist.   Neck: Supple  Cardiovascular: S1 S2 auscultated, no rubs, murmurs or gallops. Regular rate and rhythm.  Respiratory: Clear to auscultation bilaterally with equal chest rise  Abdomen: Soft, nontender, nondistended, + bowel sounds, dressing in place  Extremities: warm dry without cyanosis clubbing or edema  Neuro: AAOx3, nonfocal  Skin: Without rashes exudates or nodules   Data Reviewed: I have personally reviewed following labs and imaging studies  CBC: Recent Labs  Lab 05/11/17 1915 05/12/17 0554 05/12/17 2307 05/13/17 1047 05/14/17 0510  WBC  4.8 4.8 5.4 5.3 5.9  HGB 9.3* 9.3* 9.0* 9.6* 9.7*  HCT 28.4* 28.4* 27.8* 29.2* 30.2*  MCV 81.1 81.6 82.0 82.0 83.0  PLT 284 278 264 251 710   Basic  Metabolic Panel: Recent Labs  Lab 05/10/17 0616 05/11/17 0711 05/12/17 0554 05/13/17 0652 05/14/17 0510  NA 139 140 139 138 139  K 3.2* 3.5 3.8 3.4* 3.4*  CL 109 110 110 106 106  CO2 19* 22 23 22 23   GLUCOSE 89 89 89 99 141*  BUN 8 <5* <5* 5* 6  CREATININE 0.52 0.55 0.53 0.46 0.57  CALCIUM 7.9* 8.9 8.6* 8.5* 9.0  MG 1.5* 1.7 1.8 1.6* 1.6*   GFR: Estimated Creatinine Clearance: 77.4 mL/min (by C-G formula based on SCr of 0.57 mg/dL). Liver Function Tests: Recent Labs  Lab 05/09/17 2225 05/13/17 1330  AST 32 37  ALT 18 20  ALKPHOS 93 83  BILITOT 0.4 0.4  PROT 8.2* 6.3*  ALBUMIN 4.1 3.1*   Recent Labs  Lab 05/13/17 1330  LIPASE 19   No results for input(s): AMMONIA in the last 168 hours. Coagulation Profile: No results for input(s): INR, PROTIME in the last 168 hours. Cardiac Enzymes: No results for input(s): CKTOTAL, CKMB, CKMBINDEX, TROPONINI in the last 168 hours. BNP (last 3 results) No results for input(s): PROBNP in the last 8760 hours. HbA1C: No results for input(s): HGBA1C in the last 72 hours. CBG: No results for input(s): GLUCAP in the last 168 hours. Lipid Profile: No results for input(s): CHOL, HDL, LDLCALC, TRIG, CHOLHDL, LDLDIRECT in the last 72 hours. Thyroid Function Tests: No results for input(s): TSH, T4TOTAL, FREET4, T3FREE, THYROIDAB in the last 72 hours. Anemia Panel: No results for input(s): VITAMINB12, FOLATE, FERRITIN, TIBC, IRON, RETICCTPCT in the last 72 hours. Urine analysis:    Component Value Date/Time   COLORURINE STRAW (A) 01/04/2017 1859   APPEARANCEUR CLEAR 01/04/2017 1859   LABSPEC 1.042 (H) 01/04/2017 1859   PHURINE 6.0 01/04/2017 1859   GLUCOSEU NEGATIVE 01/04/2017 1859   HGBUR NEGATIVE 01/04/2017 1859   BILIRUBINUR NEGATIVE 01/04/2017 1859   KETONESUR NEGATIVE 01/04/2017 1859   PROTEINUR NEGATIVE 01/04/2017 1859   UROBILINOGEN 0.2 11/26/2014 0915   NITRITE NEGATIVE 01/04/2017 1859   LEUKOCYTESUR NEGATIVE 01/04/2017  1859   Sepsis Labs: @LABRCNTIP (procalcitonin:4,lacticidven:4)  ) Recent Results (from the past 240 hour(s))  MRSA PCR Screening     Status: None   Collection Time: 05/11/17 12:10 AM  Result Value Ref Range Status   MRSA by PCR NEGATIVE NEGATIVE Final    Comment:        The GeneXpert MRSA Assay (FDA approved for NASAL specimens only), is one component of a comprehensive MRSA colonization surveillance program. It is not intended to diagnose MRSA infection nor to guide or monitor treatment for MRSA infections. Performed at [facility], [address] [place], [place] [address]       Radiology Studies: No results found.   Scheduled Meds: . amLODipine  5 mg Oral Daily  . escitalopram  20 mg Oral Daily  . folic acid  1 mg Oral Daily  . hydrocortisone   Rectal QID  . lisinopril  20 mg Oral Daily  . pantoprazole  40 mg Oral BID AC   Continuous Infusions:   LOS: 4 days   Time Spent in minutes   30 minutes  Jeet Shough D.O. on 05/14/2017 at 1:06 PM  Between 7am to 7pm - Pager - [phone]  After 7pm go to [email or web address] -  password [email or web address]  And look for the night coverage person covering for me after hours  [facility] Office  [phone]

## 2017-05-14 NOTE — Progress Notes (Signed)
Reports no bleeding from rectum since yesterday and that it was not during a bm but was burgundy in color.  Reported spitting up bloody sputum last night but has had none today.

## 2017-05-14 NOTE — Progress Notes (Signed)
Reported very small amt of rectal bloody drainage today.  No more coughing up bloody sputum

## 2017-05-15 ENCOUNTER — Encounter: Payer: Self-pay | Admitting: Internal Medicine

## 2017-05-15 ENCOUNTER — Telehealth: Payer: Self-pay | Admitting: Gastroenterology

## 2017-05-15 DIAGNOSIS — K3184 Gastroparesis: Secondary | ICD-10-CM

## 2017-05-15 DIAGNOSIS — R042 Hemoptysis: Secondary | ICD-10-CM

## 2017-05-15 LAB — BASIC METABOLIC PANEL
Anion gap: 10 (ref 5–15)
BUN: 7 mg/dL (ref 6–20)
CALCIUM: 8.7 mg/dL — AB (ref 8.9–10.3)
CO2: 23 mmol/L (ref 22–32)
CREATININE: 0.64 mg/dL (ref 0.44–1.00)
Chloride: 105 mmol/L (ref 101–111)
GFR calc Af Amer: >60 mL/min (ref >60)
GFR calc non Af Amer: >60 mL/min (ref >60)
Glucose, Bld: 104 mg/dL — ABNORMAL HIGH (ref 65–99)
Potassium: 3.3 mmol/L — ABNORMAL LOW (ref 3.5–5.1)
SODIUM: 138 mmol/L (ref 135–145)

## 2017-05-15 LAB — CBC
HCT: 27.2 % — ABNORMAL LOW (ref 36.0–46.0)
Hemoglobin: 8.9 g/dL — ABNORMAL LOW (ref 12.0–15.0)
MCH: 26.6 pg (ref 26.0–34.0)
MCHC: 32.7 g/dL (ref 30.0–36.0)
MCV: 81.4 fL (ref 78.0–100.0)
Platelets: 240 10*3/uL (ref 150–400)
RBC: 3.34 MIL/uL — ABNORMAL LOW (ref 3.87–5.11)
RDW: 18.3 % — AB (ref 11.5–15.5)
WBC: 7.3 10*3/uL (ref 4.0–10.5)

## 2017-05-15 MED ORDER — ONDANSETRON HCL 4 MG PO TABS
4.0000 mg | ORAL_TABLET | Freq: Three times a day (TID) | ORAL | Status: DC
  Administered 2017-05-15 – 2017-05-17 (×7): 4 mg via ORAL
  Filled 2017-05-15 (×7): qty 1

## 2017-05-15 MED ORDER — PROMETHAZINE HCL 12.5 MG PO TABS
12.5000 mg | ORAL_TABLET | ORAL | Status: DC | PRN
  Administered 2017-05-15 – 2017-05-17 (×6): 12.5 mg via ORAL
  Filled 2017-05-15 (×7): qty 1

## 2017-05-15 MED ORDER — FERROUS SULFATE 325 (65 FE) MG PO TABS
325.0000 mg | ORAL_TABLET | Freq: Every day | ORAL | Status: DC
  Administered 2017-05-15 – 2017-05-16 (×2): 325 mg via ORAL
  Filled 2017-05-15 (×2): qty 1

## 2017-05-15 MED ORDER — HYDROMORPHONE HCL 1 MG/ML IJ SOLN
1.0000 mg | INTRAMUSCULAR | Status: DC | PRN
  Administered 2017-05-15 – 2017-05-22 (×40): 1 mg via INTRAVENOUS
  Filled 2017-05-15 (×40): qty 1

## 2017-05-15 NOTE — Progress Notes (Signed)
No bleeding from rectum today.  Hat in toilet.  Gastroparesis diet handout given to patient
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
## 2017-05-15 NOTE — Consult Note (Signed)
[facility] Nurse wound consult note Reason for Consult: Consult requested for abd wounds.  Pt states she had surgery in 2014 with Dr Arnoldo Morale here at [place], and wounds have never closed completely since that time. Wound type: 3 full thickness wounds: Left abd .2X.8X.3cm Middle abd .3X.3X.2cm Right abd .2X.2X.2cm All locations with mod amt greenish-tan drainage, some odor, painful when swab inserted. Dressing procedure/placement/frequency: Aquacel to absorb drainage and provide antimicrobial benefits. Please consult Dr Arnoldo Morale to assess chronic wounds to abd if desired, since he performed the original surgery.   Please re-consult if further assistance is needed.  Thank-you,  Julien Girt MSN, [place], [place], [place], [place]

## 2017-05-15 NOTE — Progress Notes (Addendum)
PROGRESS NOTE  Angel Price [id number] DOB: 08-22-1965 DOA: 05/09/2017 PCP: Rosita Fire, MD  HPI/Recap of past 90 hours: 52 year old female history of major depressive disorder, gastric bypass, chronic periumbilical fistulas, hypertension presented to the ED with rectal bleeding. Patient seen by gastroenterology patient underwent upper endoscopy 05/11/2017 which was pretty unremarkable for any signs of bleeding. H&H stable. Complains of coughing up blood.  05/15/17: patient seen and examined at her bedside. She reports persistent abdominal pain 9/10 after fentanyl, non radiating. States fentanyl does not work for her, only IV dilaudid. Zofran does not work either just IV phenergan for her nausea. Abdominal wounds with greenish discharge. Wound care specialist consulted. Denies rectal bleeding today. GI following. Highly appreciated.    Assessment/Plan: Principal Problem:   Acute GI bleeding Active Problems:   Bipolar disorder (HCC)   Hypothyroid   Essential hypertension, benign   Rectal bleeding   MDD (major depressive disorder), recurrent episode, severe (HCC)   Acute blood loss anemia   Hypokalemia   Chronic wound infection of abdomen   HTN (hypertension)   Absolute anemia   Abdominal wall fistula   Folate deficiency  Rectal bleeding/melena -FOBT positive -external hemorrhoid present on physical exam -EGD 05/11/17 unremarkable -no rectal bleeding in past 24 hours -GI following -continue anusol -continue PPI BID  Intractable abd pain/nausea -continue soft diet -continue phenergan prn -continue IV dilaudid -monitor intakes and outputs  Hypokalemia/hypomagnesemia -K+ 3.3, mag 1.6 -replete as indicated -po kcl and IV mag supplements -repeat levels am  Abdominal wounds -from previous abd surgeries -wound care specialist consulted; we appreciate recs -continue wound dressing changes  Normocytic anemia -suspect multifactorial 2/2 to iron deficiency vs  acute blood loss -ferrous sulfate supplement started -no rectal bleeding in past 24 hours -CBC am  Depression/anxiety -lexapro   Code Status: full  Family Communication: none at bedside   Disposition Plan: to be determined   Consultants:  GI   Procedures:  EGD   Antimicrobials:  none  DVT prophylaxis:  SCDs   Objective: Vitals:   05/14/17 0300 05/14/17 1611 05/14/17 2300 05/15/17 0637  BP: 122/81 (!) 125/94 (!) 132/93 121/85  Pulse: 72 77 82 86  Resp: 18 18 20    Temp: 97.9 F (36.6 C) 98.4 F (36.9 C) 98.7 F (37.1 C) 98.6 F (37 C)  TempSrc: Oral Oral Oral Oral  SpO2: 100% 99% 98% 96%  Weight:      Height:       No intake or output data in the 24 hours ending 05/15/17 1039 Filed Weights   05/09/17 2120 05/10/17 2353  Weight: 61.7 kg (136 lb) 65.4 kg (144 lb 2.9 oz)    Exam:   General:  52 yo AAF WD WN NAD A&O x 3  Cardiovascular: RRR no rubs or gallops   Respiratory: CTA no wheezes or rales  Abdomen: soft; abdominal wounds with purulent discharges   Musculoskeletal: no focal abnormalities   Skin: as mentioned above; external hemorrhoids noted on exam of rectum  Psychiatry: mood is anxious.   Data Reviewed: CBC: Recent Labs  Lab 05/12/17 0554 05/12/17 2307 05/13/17 1047 05/14/17 0510 05/15/17 0515  WBC 4.8 5.4 5.3 5.9 7.3  HGB 9.3* 9.0* 9.6* 9.7* 8.9*  HCT 28.4* 27.8* 29.2* 30.2* 27.2*  MCV 81.6 82.0 82.0 83.0 81.4  PLT 278 264 251 244 272   Basic Metabolic Panel: Recent Labs  Lab 05/10/17 0616 05/11/17 0711 05/12/17 0554 05/13/17 0652 05/14/17 0510 05/15/17 0515  NA 139 140 139  138 139 138  K 3.2* 3.5 3.8 3.4* 3.4* 3.3*  CL 109 110 110 106 106 105  CO2 19* 22 23 22 23 23   GLUCOSE 89 89 89 99 141* 104*  BUN 8 <5* <5* 5* 6 7  CREATININE 0.52 0.55 0.53 0.46 0.57 0.64  CALCIUM 7.9* 8.9 8.6* 8.5* 9.0 8.7*  MG 1.5* 1.7 1.8 1.6* 1.6*  --    GFR: Estimated Creatinine Clearance: 77.4 mL/min (by C-G formula based on  SCr of 0.64 mg/dL). Liver Function Tests: Recent Labs  Lab 05/09/17 2225 05/13/17 1330  AST 32 37  ALT 18 20  ALKPHOS 93 83  BILITOT 0.4 0.4  PROT 8.2* 6.3*  ALBUMIN 4.1 3.1*   Recent Labs  Lab 05/13/17 1330  LIPASE 19   No results for input(s): AMMONIA in the last 168 hours. Coagulation Profile: No results for input(s): INR, PROTIME in the last 168 hours. Cardiac Enzymes: No results for input(s): CKTOTAL, CKMB, CKMBINDEX, TROPONINI in the last 168 hours. BNP (last 3 results) No results for input(s): PROBNP in the last 8760 hours. HbA1C: No results for input(s): HGBA1C in the last 72 hours. CBG: No results for input(s): GLUCAP in the last 168 hours. Lipid Profile: No results for input(s): CHOL, HDL, LDLCALC, TRIG, CHOLHDL, LDLDIRECT in the last 72 hours. Thyroid Function Tests: No results for input(s): TSH, T4TOTAL, FREET4, T3FREE, THYROIDAB in the last 72 hours. Anemia Panel: No results for input(s): VITAMINB12, FOLATE, FERRITIN, TIBC, IRON, RETICCTPCT in the last 72 hours. Urine analysis:    Component Value Date/Time   COLORURINE STRAW (A) 01/04/2017 1859   APPEARANCEUR CLEAR 01/04/2017 1859   LABSPEC 1.042 (H) 01/04/2017 1859   PHURINE 6.0 01/04/2017 1859   GLUCOSEU NEGATIVE 01/04/2017 1859   HGBUR NEGATIVE 01/04/2017 1859   BILIRUBINUR NEGATIVE 01/04/2017 1859   KETONESUR NEGATIVE 01/04/2017 1859   PROTEINUR NEGATIVE 01/04/2017 1859   UROBILINOGEN 0.2 11/26/2014 0915   NITRITE NEGATIVE 01/04/2017 1859   LEUKOCYTESUR NEGATIVE 01/04/2017 1859   Sepsis Labs: @LABRCNTIP (procalcitonin:4,lacticidven:4)  ) Recent Results (from the past 240 hour(s))  MRSA PCR Screening     Status: None   Collection Time: 05/11/17 12:10 AM  Result Value Ref Range Status   MRSA by PCR NEGATIVE NEGATIVE Final    Comment:        The GeneXpert MRSA Assay (FDA approved for NASAL specimens only), is one component of a comprehensive MRSA colonization surveillance program. It is  not intended to diagnose MRSA infection nor to guide or monitor treatment for MRSA infections. Performed at [facility], [address] [place], [place] [address]       Studies: Dg Chest 2 View  Result Date: 05/14/2017 CLINICAL DATA:  Cough with hemoptysis. Chest pain and shortness of breath. EXAM: CHEST  2 VIEW COMPARISON:  04/26/2017 and 01/04/2017 FINDINGS: There is new slight bibasilar atelectasis. Heart size and vascularity are normal. No effusions. No bone abnormality. Multiple surgical clips in the abdomen. IMPRESSION: New bibasilar atelectasis. Electronically Signed   By: Lorriane Shire M.D.   On: 05/14/2017 15:36    Scheduled Meds: . amLODipine  5 mg Oral Daily  . escitalopram  20 mg Oral Daily  . folic acid  1 mg Oral Daily  . hydrocortisone   Rectal QID  . lisinopril  20 mg Oral Daily  . ondansetron  4 mg Oral TID AC & HS  . pantoprazole  40 mg Oral BID AC    Continuous Infusions:   LOS: 5 days  Kayleen Memos, MD [facility] Pager [phone]  If 7PM-7AM, please contact night-coverage [email or web address] Password [facility] 05/15/2017, 10:39 AM

## 2017-05-15 NOTE — Telephone Encounter (Signed)
PATIENT SCHEDULED  °

## 2017-05-15 NOTE — Telephone Encounter (Signed)
Patient needs hospital follow up in 2 months.

## 2017-05-15 NOTE — Care Management Note (Signed)
Case Management Note  Patient Details  Name: Angel Price MRN: [id number] Date of Birth: February 09, 1966  If discussed at Long Length of Stay Meetings, dates discussed:  05/15/17  Additional Comments:  Sherald Barge, RN 05/15/2017, 11:56 AM

## 2017-05-15 NOTE — Progress Notes (Signed)
Pt complaining that q2 fentaynl is not decreasing her pain. She states fentanyl does nothing and she would prefer to take dilaudid. She is also upset her phenergan was discontinued. I tried to explain that perhaps she is being weaned from medication so she may be dc'd home. She stated she is not ready to go. She states her pain is not being managed. I adv she needs to speak to doctor and see what he can do for her. I asked if this abdominal pain is new, she states she has had this pain for months now. Her pain is not controlled at home.

## 2017-05-16 ENCOUNTER — Inpatient Hospital Stay (HOSPITAL_COMMUNITY): Payer: Medicare Other

## 2017-05-16 DIAGNOSIS — L089 Local infection of the skin and subcutaneous tissue, unspecified: Secondary | ICD-10-CM

## 2017-05-16 DIAGNOSIS — K632 Fistula of intestine: Secondary | ICD-10-CM

## 2017-05-16 DIAGNOSIS — F332 Major depressive disorder, recurrent severe without psychotic features: Secondary | ICD-10-CM

## 2017-05-16 DIAGNOSIS — F310 Bipolar disorder, current episode hypomanic: Secondary | ICD-10-CM

## 2017-05-16 DIAGNOSIS — R112 Nausea with vomiting, unspecified: Secondary | ICD-10-CM

## 2017-05-16 DIAGNOSIS — R52 Pain, unspecified: Secondary | ICD-10-CM

## 2017-05-16 DIAGNOSIS — D62 Acute posthemorrhagic anemia: Secondary | ICD-10-CM

## 2017-05-16 LAB — CBC
HCT: 30.4 % — ABNORMAL LOW (ref 36.0–46.0)
HEMOGLOBIN: 9.8 g/dL — AB (ref 12.0–15.0)
MCH: 26.6 pg (ref 26.0–34.0)
MCHC: 32.2 g/dL (ref 30.0–36.0)
MCV: 82.6 fL (ref 78.0–100.0)
PLATELETS: 236 10*3/uL (ref 150–400)
RBC: 3.68 MIL/uL — AB (ref 3.87–5.11)
RDW: 19.1 % — ABNORMAL HIGH (ref 11.5–15.5)
WBC: 5.2 10*3/uL (ref 4.0–10.5)

## 2017-05-16 LAB — BASIC METABOLIC PANEL
ANION GAP: 10 (ref 5–15)
BUN: 10 mg/dL (ref 6–20)
CHLORIDE: 100 mmol/L — AB (ref 101–111)
CO2: 25 mmol/L (ref 22–32)
Calcium: 9 mg/dL (ref 8.9–10.3)
Creatinine, Ser: 0.74 mg/dL (ref 0.44–1.00)
GFR calc Af Amer: >60 mL/min (ref >60)
GLUCOSE: 95 mg/dL (ref 65–99)
POTASSIUM: 3.8 mmol/L (ref 3.5–5.1)
Sodium: 135 mmol/L (ref 135–145)

## 2017-05-16 MED ORDER — GUAIFENESIN-DM 100-10 MG/5ML PO SYRP
5.0000 mL | ORAL_SOLUTION | Freq: Three times a day (TID) | ORAL | Status: DC | PRN
  Administered 2017-05-16 – 2017-05-23 (×5): 5 mL via ORAL
  Filled 2017-05-16 (×6): qty 5

## 2017-05-16 MED ORDER — KCL IN DEXTROSE-NACL 20-5-0.9 MEQ/L-%-% IV SOLN
INTRAVENOUS | Status: AC
  Administered 2017-05-16 – 2017-05-22 (×6): via INTRAVENOUS

## 2017-05-16 MED ORDER — IOPAMIDOL (ISOVUE-300) INJECTION 61%
100.0000 mL | Freq: Once | INTRAVENOUS | Status: AC | PRN
  Administered 2017-05-16: 100 mL via INTRAVENOUS

## 2017-05-16 NOTE — Consult Note (Signed)
Reason for Consult: Abdominal wound drainage Referring Physician: Dr. Pearline Cables is an 52 y.o. female.  HPI: Patient is a 52 year old black female who is well-known to me who presented to the emergency room with abdominal pain and presumed rectal bleeding.  She was admitted to the hospital for further evaluation and treatment.  She states that since I last saw her in November 2018, there was some increased drainage around 1 of the sinus tracts.  I did last see her in November 2018, but have not seen her since then.  She was followed by home health, but they stopped seeing her due to noncompliance by the patient.  There are multiple psychosocial issues involved with this patient.  The chronic abdominal wound sinus is resulted from surgery done in the remote past at another facility.  I became involved due to the chronic nature of her nonhealing wounds.  She has been referred to plastic surgery in the past, but was noncompliant with them and also they could not offer much in the way of abdominal wall reconstruction.  She has been culture positive for MRSA on multiple occasions.  It has been recommended that unless she has significant induration or leukocytosis, this is chronic colonization of the abdominal wall.  No enterocutaneous fistula has been found on multiple previous examinations reexplorations.  When I went into the room, she was sitting up in the bed and starting to eat.  She did not appear any discomfort.  Past Medical History:  Diagnosis Date  . Abscess    soft tissue  . Adrenal mass ([place])   . Alcohol abuse   . Anxiety   . Blood transfusion without reported diagnosis   . Chronic abdominal pain   . Chronic wound infection of abdomen   . Colon polyp    colonoscopy 04/2014  . Depression   . Diverticulosis    colonoscopy 04/2014 moderat pan colonic  . Gastritis    EGD 05/2014  . Gastroparesis Nov 2015  . GERD (gastroesophageal reflux disease)   . Hemorrhoid    internal large   . Hiatal hernia   . History of Billroth II operation   . Hypertension   . Lung nodule    CT 02/2014 needs repeat 1 month  . Lung nodule < 6cm on CT 04/25/2014  . Lupus   . Nausea and vomiting    chronic, recurrent  . Pancreatitis   . Schatzki's ring    patent per EGD 04/2014  . Sickle cell trait ([place])   . Suicide attempt ([place])   . 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 ABDOMINAL HYSTERECTOMY  2013   [place]  . ABDOMINAL SURGERY    . ADRENALECTOMY Right   . AGILE CAPSULE N/A 01/05/2015   Procedure: AGILE CAPSULE;  Surgeon: Daneil Dolin, MD;  [facility];  Service: Endoscopy;  Laterality: N/A;  0700  . Billroth II procedure      [place], first 2000, 2005/2006.  Marland Kitchen BIOPSY  05/20/2013   Procedure: BIOPSIES OF ASCENDING AND SIGMOID COLON;  Surgeon: Daneil Dolin, MD;  Location: [facility];  Service: Endoscopy;;  . BIOPSY  04/26/2014   Procedure: BIOPSIES;  Surgeon: Danie Binder, MD;  Location: [facility];  Service: Endoscopy;;  . CHOLECYSTECTOMY    . COLONOSCOPY     in [place]  . COLONOSCOPY WITH PROPOFOL N/A 05/20/2013   Dr.Rourk- inadequate prep, normal appearing  rectum, grossly normal colon aside from pancolonic diverticula, normal terminal ileum bx= unremarkable colonic mucosa. Due for early interval 2016.   Marland Kitchen COLONOSCOPY WITH PROPOFOL N/A 04/26/2014   IWP:YKDXIP ileum/one colon polyp removed/moderate pan-colonic diverticulosis/large internal hemorrhoids  . COLONOSCOPY WITH PROPOFOL N/A 12/23/2014   Dr.Rourk- minimal internal hemorrhoids, pancolonic diverticulosis  . COLONOSCOPY WITH PROPOFOL N/A 08/23/2016   Procedure: COLONOSCOPY WITH PROPOFOL;  Surgeon: Danie Binder, MD;  [facility];  Service: Endoscopy;  Laterality: N/A;  . DEBRIDEMENT OF ABDOMINAL WALL ABSCESS N/A 02/08/2013   Procedure: DEBRIDEMENT OF ABDOMINAL WALL ABSCESS;  Surgeon: Jamesetta So, MD;  Location: [facility];  Service: General;   Laterality: N/A;  . ESOPHAGOGASTRODUODENOSCOPY (EGD) WITH PROPOFOL N/A 05/20/2013   Dr.Rourk- s/p prior gastric surgery with normal esophagus, residual gastric mucosa and patent efferent limb  . ESOPHAGOGASTRODUODENOSCOPY (EGD) WITH PROPOFOL N/A 02/03/2014   Dr. Gala Romney:  s/p hemigastrectomy with retained gastric contents. Residual gastric mucosa and efferent limb appeared normal otherwise. Query gastroparesis.   Marland Kitchen ESOPHAGOGASTRODUODENOSCOPY (EGD) WITH PROPOFOL N/A 04/26/2014   JAS:[id number] ring/small HH/mild non-erosive gasrtitis/normal anastomosis  . ESOPHAGOGASTRODUODENOSCOPY (EGD) WITH PROPOFOL N/A 12/23/2014   Dr.Rourk- s/p prior hemigastrctomy, active oozing from anastomotic suture site, hemostasis achieved  . ESOPHAGOGASTRODUODENOSCOPY (EGD) WITH PROPOFOL N/A 05/23/2016   Dr. 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 ESOPHAGOGASTRODUODENOSCOPY (EGD) WITH PROPOFOL N/A 05/11/2017   Procedure: ESOPHAGOGASTRODUODENOSCOPY (EGD) WITH PROPOFOL;  Surgeon: Daneil Dolin, MD;  [facility];  Service: Endoscopy;  Laterality: N/A;  . INCISION AND DRAINAGE ABSCESS N/A 01/06/2017   Procedure: INCISION AND DRAINAGE ABDOMINAL WALL ABSCESS;  Surgeon: Aviva Signs, MD;  Location: [facility];  Service: General;  Laterality: N/A;  . tendon repar Right    wrist  . WOUND EXPLORATION Right 06/24/2013   Procedure: exploration of traumatic wound right wrist;  Surgeon: Tennis Must, MD;  Location: [facility];  Service: Orthopedics;  Laterality: Right;    Family History  Problem Relation Age of Onset  . Brain cancer Son   . Schizophrenia Son   . Cancer Son        brain  . Lung cancer Father   . Cancer Father        mets  . Drug abuse Mother   . Breast cancer Maternal Aunt   . Bipolar disorder Maternal Aunt   . Drug abuse Maternal Aunt   . Colon cancer Maternal Grandmother        late 88s, early 16s  . Drug  abuse Sister   . Drug abuse Brother   . Bipolar disorder Paternal Grandfather   . Bipolar disorder Cousin   . Liver disease Neg Hx     Social History:  reports that she has been smoking cigarettes.  She has a 17.50 pack-year smoking history. she has never used smokeless tobacco. She reports that she does not drink alcohol or use drugs.  Allergies:  Allergies  Allergen Reactions  . Codeine Hives  . Morphine And Related Hives  . Reglan [Metoclopramide] Other (See Comments)    EPS symptoms     Medications: I have reviewed the patient's current medications.  Results for orders placed or performed during the hospital encounter of 05/09/17 (from the past 48 hour(s))  CBC     Status: Abnormal   Collection Time: 05/15/17  5:15 AM  Result Value Ref Range   WBC 7.3 4.0 -  10.5 K/uL   RBC 3.34 (L) 3.87 - 5.11 MIL/uL   Hemoglobin 8.9 (L) 12.0 - 15.0 g/dL   HCT 27.2 (L) 36.0 - 46.0 %   MCV 81.4 78.0 - 100.0 fL   MCH 26.6 26.0 - 34.0 pg   MCHC 32.7 30.0 - 36.0 g/dL   RDW 18.3 (H) 11.5 - 15.5 %   Platelets 240 150 - 400 K/uL    Comment: Performed at [facility], [address] [place], [place] [address]  Basic metabolic panel     Status: Abnormal   Collection Time: 05/15/17  5:15 AM  Result Value Ref Range   Sodium 138 135 - 145 mmol/L   Potassium 3.3 (L) 3.5 - 5.1 mmol/L   Chloride 105 101 - 111 mmol/L   CO2 23 22 - 32 mmol/L   Glucose, Bld 104 (H) 65 - 99 mg/dL   BUN 7 6 - 20 mg/dL   Creatinine, Ser 0.64 0.44 - 1.00 mg/dL   Calcium 8.7 (L) 8.9 - 10.3 mg/dL   GFR calc non Af Amer >60 >60 mL/min   GFR calc Af Amer >60 >60 mL/min    Comment: (NOTE) The eGFR has been calculated using the CKD EPI equation. This calculation has not been validated in all clinical situations. eGFR's persistently <60 mL/min signify possible Chronic Kidney Disease.    Anion gap 10 5 - 15    Comment: Performed at [facility], [address] [place], [place] [address]  CBC     Status: Abnormal    Collection Time: 05/16/17  7:59 AM  Result Value Ref Range   WBC 5.2 4.0 - 10.5 K/uL   RBC 3.68 (L) 3.87 - 5.11 MIL/uL   Hemoglobin 9.8 (L) 12.0 - 15.0 g/dL   HCT 30.4 (L) 36.0 - 46.0 %   MCV 82.6 78.0 - 100.0 fL   MCH 26.6 26.0 - 34.0 pg   MCHC 32.2 30.0 - 36.0 g/dL   RDW 19.1 (H) 11.5 - 15.5 %   Platelets 236 150 - 400 K/uL    Comment: Performed at [facility], [address], [place], [place] [address]  Basic metabolic panel     Status: Abnormal   Collection Time: 05/16/17  7:59 AM  Result Value Ref Range   Sodium 135 135 - 145 mmol/L   Potassium 3.8 3.5 - 5.1 mmol/L   Chloride 100 (L) 101 - 111 mmol/L   CO2 25 22 - 32 mmol/L   Glucose, Bld 95 65 - 99 mg/dL   BUN 10 6 - 20 mg/dL   Creatinine, Ser 0.74 0.44 - 1.00 mg/dL   Calcium 9.0 8.9 - 10.3 mg/dL   GFR calc non Af Amer >60 >60 mL/min   GFR calc Af Amer >60 >60 mL/min    Comment: (NOTE) The eGFR has been calculated using the CKD EPI equation. This calculation has not been validated in all clinical situations. eGFR's persistently <60 mL/min signify possible Chronic Kidney Disease.    Anion gap 10 5 - 15    Comment: Performed at [facility], [address], [place],  [address]    Dg Chest 2 View  Result Date: 05/14/2017 CLINICAL DATA:  Cough with hemoptysis. Chest pain and shortness of breath. EXAM: CHEST  2 VIEW COMPARISON:  04/26/2017 and 01/04/2017 FINDINGS: There is new slight bibasilar atelectasis. Heart size and vascularity are normal. No effusions. No bone abnormality. Multiple surgical clips in the abdomen. IMPRESSION: New bibasilar atelectasis. Electronically Signed   By:  Lorriane Shire M.D.   On: 05/14/2017 15:36    ROS:  Pertinent items are noted in HPI.  Blood pressure 100/65, pulse 88, temperature (!) 97.4 F (36.3 C), resp. rate 20, height '5\' 3"'  (1.6 m), weight 144 lb 2.9 oz (65.4 kg), SpO2 96 %. Physical Exam: Pleasant black female in no acute distress. Abdomen was soft.  She has multiple areas  on her abdominal wall from previous surgical incision sites with approximately 3 areas of indentation from chronic fistulization.  She states that the right-sided fistulas which were previously addressed have been draining some greenish white fluid.  I was not able to express a significant amount of drainage.  There is no significant induration like I have seen in the past.  She was only tender around the areas of drainage.  Labs reviewed.  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  There is nothing acute at this time to surgically address.  She has been on multiple antibiotic regimens in the past, but I am afraid that this is chronic colonization and I would not recommend any further courses of antibiotics at this time.  She does have a history of narcotic seeking behavior, and she does not appear to be to be in acute pain.  I have given the patient recommendations on keeping her abdominal wall clean and dry with soap and water.  Upon discharge, she can return as needed.  Doubt referral to wound care center or home health would be beneficial as she has been noncompliant in the past.  Please call me if I can be of further assistance.  Aviva Signs 05/16/2017, 12:31 PM

## 2017-05-16 NOTE — Progress Notes (Signed)
[place], exam completed at 753pm.  Patients exam accession was scanned under wrong patient prior to my arrival to work wich caused an exception for this patient.  Images linked and corrected afterwards. Exception states exam finished at 18:47pm

## 2017-05-16 NOTE — Care Management Important Message (Signed)
Important Message  Patient Details  Name: Angel Price MRN: [id number] Date of Birth: 01/19/1966   Medicare Important Message Given:  Yes    Sherald Barge, RN 05/16/2017, 12:34 PM

## 2017-05-16 NOTE — Progress Notes (Signed)
Beck Depression Inventory questionnaire printed and given to patient to complete. Patient scored 17 (Borderline clinical depression). Questionnaire placed in patient's chart.

## 2017-05-16 NOTE — Progress Notes (Signed)
Dr fields called to ask if we received order about patient at 07:45pm told dr fields we did and patient was to be scanned soon. Patient was scheduled for scan at 0745 and was drinking contrast prep for scan until then.  Exam completed 8pm.

## 2017-05-16 NOTE — Progress Notes (Signed)
PROGRESS NOTE  Angel Price OJJ:[id number] DOB: May 18, 1965 DOA: 05/09/2017 PCP: Rosita Fire, MD  HPI/Recap of past 24 hours: 52 year old female history of major depressive disorder, gastric bypass, chronic periumbilical fistulas, hypertension presented to the ED with rectal bleeding. Patient seen by gastroenterology patient underwent upper endoscopy 05/11/2017 which was pretty unremarkable for any signs of bleeding. H&H stable. Complains of coughing up blood.  05/15/17: patient seen and examined at her bedside. She reports persistent abdominal pain 9/10 after fentanyl, non radiating. States fentanyl does not work for her, only IV dilaudid. Zofran does not work either just IV phenergan for her nausea. Abdominal wounds with greenish discharge. Wound care specialist consulted. Denies rectal bleeding today. GI following. Highly appreciated.  2/29/19: patient seen and examined at her bedside. Reports persistent abd pain and nausea. Patient is sitting up comfortably in the bed in no distress having a conversation and reports pain is 9/10. Suspect pain med seeking behavior. Abdominal wound sample sent for culture.   Assessment/Plan: Principal Problem:   Acute GI bleeding Active Problems:   Bipolar disorder ([place])   Hypothyroid   Essential hypertension, benign   Rectal bleeding   MDD (major depressive disorder), recurrent episode, severe (HCC)   Acute blood loss anemia   Hypokalemia   Chronic wound infection of abdomen   HTN (hypertension)   Absolute anemia   Abdominal wall fistula   Folate deficiency   Non-intractable vomiting with nausea  Rectal bleeding/melena -FOBT positive -external hemorrhoid present on physical exam -EGD 05/11/17 unremarkable -no rectal bleeding in past 48 hours -GI following -continue anusol -continue PPI BID  Intractable abd pain/nausea -05/13/17 continue soft diet -continue phenergan prn -continue IV dilaudid -monitor intakes and  outputs  Hypokalemia/hypomagnesemia, resolved -05/16/17 K+ 3.8 from 3.3, mag 1.6 -replete as indicated -po kcl and IV mag supplements  Abdominal wounds in the setting of previous abdominal abscess, I&Ds -previous abd surgeries, I&Ds -wound care specialist consulted; we appreciate recs -continue wound dressing changes -Dr Arnoldo Morale saw the patient -wound sample sent for culture -afebrile no leukocytosis   Normocytic anemia -suspect multifactorial 2/2 to iron deficiency vs acute blood loss -05/16/17 continue ferrous sulfate supplement started -no rectal bleeding in past 24 hours -CBC am  Depression/anxiety/pain med seeking behavior -lexapro -beck depression inventory ordered -Recommend follow up with psychiatry in the outpatient setting   Code Status: full  Family Communication: none at bedside   Disposition Plan: to be determined   Consultants:  GI   Procedures:  EGD   Antimicrobials:  none  DVT prophylaxis:  SCDs   Objective: Vitals:   05/15/17 0637 05/15/17 1429 05/15/17 2300 05/16/17 0512  BP: 121/85 (!) 139/92 109/81 100/65  Pulse: 86 98 81 88  Resp:  18 20 20   Temp: 98.6 F (37 C) 98.7 F (37.1 C) 98.3 F (36.8 C) (!) 97.4 F (36.3 C)  TempSrc: Oral Oral    SpO2: 96% 96% 97% 96%  Weight:      Height:        Intake/Output Summary (Last 24 hours) at 05/16/2017 1515 Last data filed at 05/16/2017 0600 Gross per 24 hour  Intake 420 ml  Output 400 ml  Net 20 ml   Filed Weights   05/09/17 2120 05/10/17 2353  Weight: 61.7 kg (136 lb) 65.4 kg (144 lb 2.9 oz)    Exam: 05/16/17 patient seen and examined at her bedside. Physical exam is unchanged from yesterday 05/15/17.   General:  52 yo AAF WD WN NAD A&O x  3  Cardiovascular: RRR no rubs or gallops   Respiratory: CTA no wheezes or rales  Abdomen: soft; abdominal wounds with greenish odorless discharges   Musculoskeletal: no focal abnormalities   Skin: as mentioned above; external hemorrhoids  noted on exam of rectum  Psychiatry: mood is anxious.   Data Reviewed: CBC: Recent Labs  Lab 05/12/17 2307 05/13/17 1047 05/14/17 0510 05/15/17 0515 05/16/17 0759  WBC 5.4 5.3 5.9 7.3 5.2  HGB 9.0* 9.6* 9.7* 8.9* 9.8*  HCT 27.8* 29.2* 30.2* 27.2* 30.4*  MCV 82.0 82.0 83.0 81.4 82.6  PLT 264 251 244 240 428   Basic Metabolic Panel: Recent Labs  Lab 05/10/17 0616 05/11/17 0711 05/12/17 0554 05/13/17 0652 05/14/17 0510 05/15/17 0515 05/16/17 0759  NA 139 140 139 138 139 138 135  K 3.2* 3.5 3.8 3.4* 3.4* 3.3* 3.8  CL 109 110 110 106 106 105 100*  CO2 19* 22 23 22 23 23 25   GLUCOSE 89 89 89 99 141* 104* 95  BUN 8 <5* <5* 5* 6 7 10   CREATININE 0.52 0.55 0.53 0.46 0.57 0.64 0.74  CALCIUM 7.9* 8.9 8.6* 8.5* 9.0 8.7* 9.0  MG 1.5* 1.7 1.8 1.6* 1.6*  --   --    GFR: Estimated Creatinine Clearance: 77.4 mL/min (by C-G formula based on SCr of 0.74 mg/dL). Liver Function Tests: Recent Labs  Lab 05/09/17 2225 05/13/17 1330  AST 32 37  ALT 18 20  ALKPHOS 93 83  BILITOT 0.4 0.4  PROT 8.2* 6.3*  ALBUMIN 4.1 3.1*   Recent Labs  Lab 05/13/17 1330  LIPASE 19   No results for input(s): AMMONIA in the last 168 hours. Coagulation Profile: No results for input(s): INR, PROTIME in the last 168 hours. Cardiac Enzymes: No results for input(s): CKTOTAL, CKMB, CKMBINDEX, TROPONINI in the last 168 hours. BNP (last 3 results) No results for input(s): PROBNP in the last 8760 hours. HbA1C: No results for input(s): HGBA1C in the last 72 hours. CBG: No results for input(s): GLUCAP in the last 168 hours. Lipid Profile: No results for input(s): CHOL, HDL, LDLCALC, TRIG, CHOLHDL, LDLDIRECT in the last 72 hours. Thyroid Function Tests: No results for input(s): TSH, T4TOTAL, FREET4, T3FREE, THYROIDAB in the last 72 hours. Anemia Panel: No results for input(s): VITAMINB12, FOLATE, FERRITIN, TIBC, IRON, RETICCTPCT in the last 72 hours. Urine analysis:    Component Value Date/Time    COLORURINE STRAW (A) 01/04/2017 1859   APPEARANCEUR CLEAR 01/04/2017 1859   LABSPEC 1.042 (H) 01/04/2017 1859   PHURINE 6.0 01/04/2017 1859   GLUCOSEU NEGATIVE 01/04/2017 1859   HGBUR NEGATIVE 01/04/2017 1859   BILIRUBINUR NEGATIVE 01/04/2017 1859   KETONESUR NEGATIVE 01/04/2017 1859   PROTEINUR NEGATIVE 01/04/2017 1859   UROBILINOGEN 0.2 11/26/2014 0915   NITRITE NEGATIVE 01/04/2017 1859   LEUKOCYTESUR NEGATIVE 01/04/2017 1859   Sepsis Labs: @LABRCNTIP (procalcitonin:4,lacticidven:4)  ) Recent Results (from the past 240 hour(s))  MRSA PCR Screening     Status: None   Collection Time: 05/11/17 12:10 AM  Result Value Ref Range Status   MRSA by PCR NEGATIVE NEGATIVE Final    Comment:        The GeneXpert MRSA Assay (FDA approved for NASAL specimens only), is one component of a comprehensive MRSA colonization surveillance program. It is not intended to diagnose MRSA infection nor to guide or monitor treatment for MRSA infections. Performed at [facility], [address], [place], [place] [address]       Studies: Dg Hip [facility]  With Pelvis 1v Left  Result Date: 05/16/2017 CLINICAL DATA:  52 year old female with history of knot on the lateral aspect of the left hip. EXAM: DG HIP (WITH OR WITHOUT PELVIS) 1V PORT LEFT COMPARISON:  Pelvis and left hip radiograph 11/01/2016. FINDINGS: There is no evidence of hip fracture or dislocation. There is no evidence of arthropathy or other focal bone abnormality. IMPRESSION: Negative. Electronically Signed   By: Vinnie Langton M.D.   On: 05/16/2017 13:03    Scheduled Meds: . amLODipine  5 mg Oral Daily  . escitalopram  20 mg Oral Daily  . ferrous sulfate  325 mg Oral Q breakfast  . folic acid  1 mg Oral Daily  . hydrocortisone   Rectal QID  . lisinopril  20 mg Oral Daily  . ondansetron  4 mg Oral TID AC & HS  . pantoprazole  40 mg Oral BID AC    Continuous Infusions:   LOS: 6 days     Kayleen Memos, MD [facility] Pager [phone]  If 7PM-7AM, please contact night-coverage [email or web address] Password [email or web address] 05/16/2017, 3:15 PM

## 2017-05-16 NOTE — Progress Notes (Signed)
Subjective: Patient states she "doesn't feel good." Has nausea, states she vomited last night. Has seen a little blood. Abdominal pain around her incision site. Per nursing staff no recent bleeding noted.  Objective: Vital signs in last 24 hours: Temp:  [97.4 F (36.3 C)-98.7 F (37.1 C)] 97.4 F (36.3 C) (03/01 0512) Pulse Rate:  [81-98] 88 (03/01 0512) Resp:  [18-20] 20 (03/01 0512) BP: (100-139)/(65-92) 100/65 (03/01 0512) SpO2:  [96 %-97 %] 96 % (03/01 0512) Last BM Date: 05/14/17 General:   Alert and oriented, pleasant Eyes:  No icterus, sclera clear. Conjuctiva pink.  Heart:  S1, S2 present, no murmurs noted.  Lungs: Clear to auscultation bilaterally, without wheezing, rales, or rhonchi.  Abdomen:  Bowel sounds present, soft, non-distended. Periumbilical incision area TTP. No HSM or hernias noted. No rebound or guarding. No masses appreciated  Msk:  Symmetrical without gross deformities. Extremities:  Without clubbing or edema. Neurologic:  Alert and  oriented x4;  grossly normal neurologically. Psych:  Alert and cooperative. Normal mood and affect.  Intake/Output from previous day: 02/28 0701 - 03/01 0700 In: 420 [P.O.:420] Out: 400 [Urine:400] Intake/Output this shift: No intake/output data recorded.  Lab Results: Recent Labs    05/13/17 1047 05/14/17 0510 05/15/17 0515  WBC 5.3 5.9 7.3  HGB 9.6* 9.7* 8.9*  HCT 29.2* 30.2* 27.2*  PLT 251 244 240   BMET Recent Labs    05/14/17 0510 05/15/17 0515  NA 139 138  K 3.4* 3.3*  CL 106 105  CO2 23 23  GLUCOSE 141* 104*  BUN 6 7  CREATININE 0.57 0.64  CALCIUM 9.0 8.7*   LFT Recent Labs    05/13/17 1330  PROT 6.3*  ALBUMIN 3.1*  AST 37  ALT 20  ALKPHOS 83  BILITOT 0.4  BILIDIR 0.1  IBILI 0.3   PT/INR No results for input(s): LABPROT, INR in the last 72 hours. Hepatitis Panel No results for input(s): HEPBSAG, HCVAB, HEPAIGM, HEPBIGM in the last 72 hours.   Studies/Results: Dg Chest 2  View  Result Date: 05/14/2017 CLINICAL DATA:  Cough with hemoptysis. Chest pain and shortness of breath. EXAM: CHEST  2 VIEW COMPARISON:  04/26/2017 and 01/04/2017 FINDINGS: There is new slight bibasilar atelectasis. Heart size and vascularity are normal. No effusions. No bone abnormality. Multiple surgical clips in the abdomen. IMPRESSION: New bibasilar atelectasis. Electronically Signed   By: Lorriane Shire M.D.   On: 05/14/2017 15:36    Assessment: 52 year old female with complicated past medical history to include pancreatitis, history of ETOH abuse, prior BIllroth II, anemia of chronic disease, gastroparesis, GERD, chronic abdominal pain. 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 Last colonoscopy in June 2018 with normal TI, multiple diverticula, large non-bleeding internal hemorrhoidswith surgical eval for hemorrhoidectomy recommended. EGD this admissionwith normal esophagus, previous placed hemostasis clips remained in place. No specimens collected.   Rectal bleedingpersistent but improved from admission: known hemorrhoids. Hgb with slight decline over past couple of days. Doubt dealing with stuttering diverticular bleed. Would recommend outpatient evaluation with Dr. Arnoldo Morale for consideration of hemorrhoid repair. Per nursing staff she had very minimal amount of rectal bleeding over the past couple days.   Acute on chronic anemia with decline in ferritin further since 11/2016. Prior EGD/TCS as outline but capsule could not be completed due to agile test being inconclusive. Patient denies menstrual flow. ?related to prior gastrectomy/absorption. May  benefit from hematology evaluation.  Chronic N/V as outlined.  Yesterday around-the-clock Zofran was ordered along with as needed oral Phenergan.  Patient requested to continue soft diet due to nausea yesterday.  Depression: patient  used to be followed by behavioral medicine. Prior suicide attempt after her son died of brain tumor Jul 08, 2014. She would like to resume care with behavioral medicine. Denies current suicidal ideation.   Today she states her abdomen is hurting about the same as yesterday. Still with nausea. Claims vomiting x 1 last night. States her stomach "doesn't feel good." Periumbilical surgical area tenderness. Minimal to no rectal bleeding per patient/staff. CBC today with stable/improved hgb. No leucocytosis. BMP essentially normal.  Plan: 1. Continued soft/gastroparesis diet. 2. Continue antiemetics 3. Continued PPI 4. Again recommended referrals to hematology (chronic anemia), surgery (hemorrhoids), and behavioral medicine (depression and previous suicide attempt.) 5. Supportive measures 6. Anticipate discharge soon   Thank you for allowing [place] to participate in the care of Angel Marek, DNP, AGNP-C Adult & Gerontological Nurse Practitioner [facility]    LOS: 6 days    05/16/2017, 8:27 AM

## 2017-05-16 NOTE — Progress Notes (Signed)
1400 Culture obtained on ABD post op surgical sites as ordered. Sites noted draining tanish/yellow purulent drainage, tender to the touch, swollen, and redness. Areas cleansed with sterile water and clean, dry ABD pad applied. Patient tolerated well. Specimen delivered to lab and is pending at this time.

## 2017-05-17 ENCOUNTER — Inpatient Hospital Stay (HOSPITAL_COMMUNITY): Payer: Medicare Other

## 2017-05-17 DIAGNOSIS — G43A0 Cyclical vomiting, not intractable: Secondary | ICD-10-CM

## 2017-05-17 DIAGNOSIS — K56609 Unspecified intestinal obstruction, unspecified as to partial versus complete obstruction: Secondary | ICD-10-CM

## 2017-05-17 MED ORDER — KETOROLAC TROMETHAMINE 15 MG/ML IJ SOLN
15.0000 mg | Freq: Four times a day (QID) | INTRAMUSCULAR | Status: AC | PRN
  Administered 2017-05-17 – 2017-05-22 (×10): 15 mg via INTRAVENOUS
  Filled 2017-05-17 (×12): qty 1

## 2017-05-17 MED ORDER — PANTOPRAZOLE SODIUM 40 MG IV SOLR
40.0000 mg | Freq: Two times a day (BID) | INTRAVENOUS | Status: DC
  Administered 2017-05-17 – 2017-05-22 (×11): 40 mg via INTRAVENOUS
  Filled 2017-05-17 (×12): qty 40

## 2017-05-17 MED ORDER — ONDANSETRON HCL 4 MG/2ML IJ SOLN: 4.0000 mg | Freq: Three times a day (TID) | INTRAMUSCULAR | Status: DC

## 2017-05-17 MED ORDER — DIPHENHYDRAMINE HCL 50 MG/ML IJ SOLN
12.5000 mg | Freq: Once | INTRAMUSCULAR | Status: AC
  Administered 2017-05-17: 12.5 mg via INTRAVENOUS
  Filled 2017-05-17: qty 1

## 2017-05-17 MED ORDER — PROMETHAZINE HCL 25 MG/ML IJ SOLN
12.5000 mg | Freq: Four times a day (QID) | INTRAMUSCULAR | Status: DC | PRN
  Administered 2017-05-17 – 2017-05-22 (×17): 12.5 mg via INTRAVENOUS
  Filled 2017-05-17 (×18): qty 1

## 2017-05-17 NOTE — Progress Notes (Signed)
PROGRESS NOTE    Angel Price  FBP:[id number] DOB: 1965-10-05 DOA: 05/09/2017 PCP: Rosita Fire, MD    Brief Narrative:  52 year old female with a history of gastric bypass, chronic periumbilical fistulous, hypertension, presented to the hospital with complaints of rectal bleeding.  Seen by gastroenterology and felt as this was likely hemorrhoidal.  She continues to have nausea and vomiting and CT scan indicated small bowel obstruction.  General surgery following.   Assessment & Plan:   Principal Problem:   Acute GI bleeding Active Problems:   Bipolar disorder ([place])   Hypothyroid   Essential hypertension, benign   Rectal bleeding   MDD (major depressive disorder), recurrent episode, severe (HCC)   Acute blood loss anemia   Hypokalemia   Chronic wound infection of abdomen   HTN (hypertension)   Absolute anemia   Abdominal wall fistula   Folate deficiency   Non-intractable vomiting with nausea   SBO (small bowel obstruction) ([place])   1. Rectal bleeding.  Suspect is related to hemorrhoids.  Hemoglobin has been stable.  At 9.8.  Will recheck in a.m.  GI following.  Continue Anusol. 2. Nausea and vomiting.  Related to small bowel obstruction.  Noted on CT scan.  General surgery following.  NG tube placed for decompression.  Continue n.p.o. status. 3. Hypokalemia/hypomagnesemia.  Resolved with replacement. 4. Abdominal wounds in the setting of previous abdominal surgeries.  Seen by general surgery and no further antibiotics recommended at this time.  Appears to be chronic.  Recommended to keep wounds clean.  Follow-up with general surgery as an outpatient. 5. Normocytic anemia.  Secondary to iron deficiency versus acute blood loss.  Continue on iron supplementation.  Monitor CBC 6. Depression/anxiety.  Continue on Lexapro   DVT prophylaxis: SCDs Code Status: Full code Family Communication: No family present Disposition Plan: Discharge home once improved  Consultants:    Gastroenterology  General surgery  Procedures:     Antimicrobials:      Subjective: Continues to complain of abdominal pain.  Says she is still having vomiting.  Is passing flatus, but no bowel movement.  Objective: Vitals:   05/16/17 1500 05/16/17 2202 05/17/17 0608 05/17/17 1300  BP: 123/60 111/76 112/83 115/79  Pulse: 87 94 78 80  Resp: 19 20 20 20   Temp: 97.9 F (36.6 C) 98.6 F (37 C) 98.3 F (36.8 C) 98.6 F (37 C)  TempSrc:      SpO2: 97% 98% 94%   Weight:      Height:        Intake/Output Summary (Last 24 hours) at 05/17/2017 1704 Last data filed at 05/17/2017 0500 Gross per 24 hour  Intake 156.67 ml  Output 1000 ml  Net -843.33 ml   Filed Weights   05/09/17 2120 05/10/17 2353  Weight: 61.7 kg (136 lb) 65.4 kg (144 lb 2.9 oz)    Examination:  General exam: Appears calm and comfortable  Respiratory system: Clear to auscultation. Respiratory effort normal. Cardiovascular system: S1 & S2 heard, RRR. No JVD, murmurs, rubs, gallops or clicks. No pedal edema. Gastrointestinal system: Abdomen is nondistended, soft and diffusely tender. No organomegaly or masses felt. Normal bowel sounds heard. Central nervous system: Alert and oriented. No focal neurological deficits. Extremities: Symmetric 5 x 5 power. Skin: Wounds on abdominal wall, some draining purulent fluid.  No surrounding erythema.  Appears chronic. Psychiatry: Judgement and insight appear normal. Mood & affect appropriate.     Data Reviewed: I have personally reviewed following labs and imaging studies  CBC: Recent Labs  Lab 05/12/17 2307 05/13/17 1047 05/14/17 0510 05/15/17 0515 05/16/17 0759  WBC 5.4 5.3 5.9 7.3 5.2  HGB 9.0* 9.6* 9.7* 8.9* 9.8*  HCT 27.8* 29.2* 30.2* 27.2* 30.4*  MCV 82.0 82.0 83.0 81.4 82.6  PLT 264 251 244 240 016   Basic Metabolic Panel: Recent Labs  Lab 05/11/17 0711 05/12/17 0554 05/13/17 0652 05/14/17 0510 05/15/17 0515 05/16/17 0759  NA 140 139 138  139 138 135  K 3.5 3.8 3.4* 3.4* 3.3* 3.8  CL 110 110 106 106 105 100*  CO2 22 23 22 23 23 25   GLUCOSE 89 89 99 141* 104* 95  BUN <5* <5* 5* 6 7 10   CREATININE 0.55 0.53 0.46 0.57 0.64 0.74  CALCIUM 8.9 8.6* 8.5* 9.0 8.7* 9.0  MG 1.7 1.8 1.6* 1.6*  --   --    GFR: Estimated Creatinine Clearance: 77.4 mL/min (by C-G formula based on SCr of 0.74 mg/dL). Liver Function Tests: Recent Labs  Lab 05/13/17 1330  AST 37  ALT 20  ALKPHOS 83  BILITOT 0.4  PROT 6.3*  ALBUMIN 3.1*   Recent Labs  Lab 05/13/17 1330  LIPASE 19   No results for input(s): AMMONIA in the last 168 hours. Coagulation Profile: No results for input(s): INR, PROTIME in the last 168 hours. Cardiac Enzymes: No results for input(s): CKTOTAL, CKMB, CKMBINDEX, TROPONINI in the last 168 hours. BNP (last 3 results) No results for input(s): PROBNP in the last 8760 hours. HbA1C: No results for input(s): HGBA1C in the last 72 hours. CBG: No results for input(s): GLUCAP in the last 168 hours. Lipid Profile: No results for input(s): CHOL, HDL, LDLCALC, TRIG, CHOLHDL, LDLDIRECT in the last 72 hours. Thyroid Function Tests: No results for input(s): TSH, T4TOTAL, FREET4, T3FREE, THYROIDAB in the last 72 hours. Anemia Panel: No results for input(s): VITAMINB12, FOLATE, FERRITIN, TIBC, IRON, RETICCTPCT in the last 72 hours. Sepsis Labs: No results for input(s): PROCALCITON, LATICACIDVEN in the last 168 hours.  Recent Results (from the past 240 hour(s))  MRSA PCR Screening     Status: None   Collection Time: 05/11/17 12:10 AM  Result Value Ref Range Status   MRSA by PCR NEGATIVE NEGATIVE Final    Comment:        The GeneXpert MRSA Assay (FDA approved for NASAL specimens only), is one component of a comprehensive MRSA colonization surveillance program. It is not intended to diagnose MRSA infection nor to guide or monitor treatment for MRSA infections. Performed at [facility], [address], [place], [place]  [address]   Aerobic Culture (superficial specimen)     Status: None (Preliminary result)   Collection Time: 05/16/17 12:40 PM  Result Value Ref Range Status   Specimen Description   Final    ABDOMEN Performed at [facility], [address], [place], [place] [address]    Special Requests   Final    Normal Performed at [facility] [place], [place] [address]    Gram Stain   Final    FEW WBC PRESENT,BOTH PMN AND MONONUCLEAR NO ORGANISMS SEEN    Culture   Final    CULTURE REINCUBATED FOR BETTER GROWTH Performed at [facility], [place] [address] [place],  [address]    Report Status PENDING  Incomplete         Radiology Studies: Ct Abdomen Pelvis W Contrast  Result Date: 05/16/2017 CLINICAL DATA:  History of surgery with open wounds with drainage  EXAM: CT ABDOMEN AND PELVIS WITH CONTRAST TECHNIQUE: Multidetector CT imaging of the abdomen and pelvis was performed using the standard protocol following bolus administration of intravenous contrast. CONTRAST:  172mL ISOVUE-300 IOPAMIDOL (ISOVUE-300) INJECTION 61% COMPARISON:  05/10/2017 FINDINGS: Lower chest: Linear scarring/atelectasis in the right upper lobe, lingula, and left lower lobe. Calcified granuloma in the right middle lobe. Hepatobiliary: Liver is within normal limits. Status post cholecystectomy. Mild intrahepatic ductal prominence. Dilated common duct, measuring 12 mm although tapering at the ampulla, likely postsurgical. Pancreas: Within normal limits. Spleen: Within normal limits. Adrenals/Urinary Tract: 18 mm left adrenal adenoma. Right adrenal gland is not well visualized. Surgical clips in the right upper abdomen. Kidneys within normal limits.  No hydronephrosis. Bladder is underdistended but unremarkable. Stomach/Bowel: Postsurgical changes involving the stomach with additional surgical clips at the GE junction. Dilated loops of small bowel in the left mid abdomen. Fecalization in the right mid abdomen,  leading to a transition point beneath the right mid abdominal wall (series 2/image 58). Decompressed loops of distal/terminal ileum in the right lower quadrant (series 2/image 62). These findings are compatible with high-grade small bowel obstruction. Vascular/Lymphatic: No evidence of abdominal aortic aneurysm. Atherosclerotic calcifications of the abdominal aorta and branch vessels. No suspicious abdominopelvic lymphadenopathy. Reproductive: Status post hysterectomy. No adnexal masses. Other: No abdominopelvic ascites. Three fistulous tracts along the right mid abdominal wall (series 2/image 55), left mid abdominal wall (series 2/images 60-61), and right periumbilical region (series 2/images 63-64). Underlying fluid collections along the anterior abdominal wall have improved, although 1.3 x 2.2 cm fluid collection in the left mid anterior abdominal wall persists (series 2/image 58), previously 1.6 x 2.9 cm. Musculoskeletal: Visualized osseous structures are within normal limits. IMPRESSION: Improving fluid collections along the anterior abdominal wall, with a residual 1.3 x 2.2 cm fluid collection on the left. Three associated fistulous tracts, as described above, grossly unchanged. Dilated loops of small bowel in the left mid abdomen, with associated transition point beneath the right mid abdominal wall, compatible with small bowel obstruction. Additional stable postsurgical and ancillary findings as above. Electronically Signed   By: Julian Hy M.D.   On: 05/16/2017 21:19   Dg Chest Port 1 View  Result Date: 05/17/2017 CLINICAL DATA:  Nasogastric tube placement EXAM: PORTABLE CHEST 1 VIEW COMPARISON:  05/14/2017 FINDINGS: There is a nasogastric tube coiled in the fundus of the stomach. There is no focal consolidation. There is no pleural effusion or pneumothorax. The heart and mediastinal contours are unremarkable. The osseous structures are unremarkable. IMPRESSION: Nasogastric tube coiled in the  fundus of the stomach. Electronically Signed   By: Kathreen Devoid   On: 05/17/2017 12:22   Dg Hip Port [place]il[place] With Pelvis 1v Left  Result Date: 05/16/2017 CLINICAL DATA:  52 year old female with history of knot on the lateral aspect of the left hip. EXAM: DG HIP (WITH OR WITHOUT PELVIS) 1V PORT LEFT COMPARISON:  Pelvis and left hip radiograph 11/01/2016. FINDINGS: There is no evidence of hip fracture or dislocation. There is no evidence of arthropathy or other focal bone abnormality. IMPRESSION: Negative. Electronically Signed   By: Vinnie Langton M.D.   On: 05/16/2017 13:03        Scheduled Meds: . amLODipine  5 mg Oral Daily  . hydrocortisone   Rectal QID  . lisinopril  20 mg Oral Daily  . pantoprazole (PROTONIX) IV  40 mg Intravenous Q12H   Continuous Infusions: . dextrose 5 % and 0.9 % NaCl with KCl 20 mEq/L 50  mL/hr at 05/16/17 2352     LOS: 7 days    Time spent: 25 minutes    Kathie Dike, MD [facility] Pager [phone]  If 7PM-7AM, please contact night-coverage [email or web address] Password [facility] 05/17/2017, 5:04 PM

## 2017-05-17 NOTE — Progress Notes (Signed)
[facility] Progress Note  6 Days Post-Op  Subjective: Patient seen by Dr. 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  When Dr. Oneida Alar saw patient yesterday she reported being able to not tolerate her diet. Given this a repeat CT was performed, and Dr. Oneida Alar called me last night telling me there was a SBO.    This AM patient says she is having some bloating and abdominal discomfort.  No vomiting, some nausea at times. She has had some flatus but not "normal" and has not had a documented BM for 3 days. In addition, her prior BMs she says were just blood and not stool.   Objective: Vital signs in last 24 hours: Temp:  [97.9 F (36.6 C)-98.6 F (37 C)] 98.3 F (36.8 C) (03/02 0608) Pulse Rate:  [78-94] 78 (03/02 0608) Resp:  [19-20] 20 (03/02 4166) BP: (111-123)/(60-83) 112/83 (03/02 0608) SpO2:  [94 %-98 %] 94 % (03/02 0608) Last BM Date: 05/17/17  Intake/Output from previous day: 03/01 0701 - 03/02 0700 In: 156.7 [I.V.:156.7] Out: 1000 [Urine:1000] Intake/Output this shift: No intake/output data recorded.  General appearance: alert, cooperative and no distress Resp: normal work of breathing GI: soft, nondistended, tender in the midline at the fistula drainage sites, otherwise nontender, no rebound or guarding Extremities: extremities normal, atraumatic, no cyanosis or edema  Lab Results:  Recent Labs    05/15/17 0515 05/16/17 0759  WBC 7.3 5.2  HGB 8.9* 9.8*  HCT 27.2* 30.4*  PLT 240 236   BMET Recent Labs    05/15/17 0515 05/16/17 0759  NA 138 135  K 3.3* 3.8  CL 105 100*  CO2 23 25  GLUCOSE 104* 95  BUN 7 10  CREATININE 0.64 0.74  CALCIUM 8.7* 9.0    Personally reviewed CT- dilated loops of SB, fecalized SB distally with transition point to non dilated SB, colon with stool on right side, left sided decompressed   Studies/Results:  Ct Abdomen Pelvis W Contrast  Result Date:  05/16/2017 CLINICAL DATA:  History of surgery with open wounds with drainage EXAM: CT ABDOMEN AND PELVIS WITH CONTRAST TECHNIQUE: Multidetector CT imaging of the abdomen and pelvis was performed using the standard protocol following bolus administration of intravenous contrast. CONTRAST:  160mL ISOVUE-300 IOPAMIDOL (ISOVUE-300) INJECTION 61% COMPARISON:  05/10/2017 FINDINGS: Lower chest: Linear scarring/atelectasis in the right upper lobe, lingula, and left lower lobe. Calcified granuloma in the right middle lobe. Hepatobiliary: Liver is within normal limits. Status post cholecystectomy. Mild intrahepatic ductal prominence. Dilated common duct, measuring 12 mm although tapering at the ampulla, likely postsurgical. Pancreas: Within normal limits. Spleen: Within normal limits. Adrenals/Urinary Tract: 18 mm left adrenal adenoma. Right adrenal gland is not well visualized. Surgical clips in the right upper abdomen. Kidneys within normal limits.  No hydronephrosis. Bladder is underdistended but unremarkable. Stomach/Bowel: Postsurgical changes involving the stomach with additional surgical clips at the GE junction. Dilated loops of small bowel in the left mid abdomen. Fecalization in the right mid abdomen, leading to a transition point beneath the right mid abdominal wall (series 2/image 58). Decompressed loops of distal/terminal ileum in the right lower quadrant (series 2/image 62). These findings are compatible with high-grade small bowel obstruction. Vascular/Lymphatic: No evidence of abdominal aortic aneurysm. Atherosclerotic calcifications of the abdominal aorta and branch vessels. No suspicious abdominopelvic lymphadenopathy. Reproductive: Status post hysterectomy. No adnexal masses. Other: No abdominopelvic ascites. Three fistulous tracts along the right mid abdominal wall (series 2/image 55), left mid abdominal  wall (series 2/images 60-61), and right periumbilical region (series 2/images 63-64). Underlying fluid  collections along the anterior abdominal wall have improved, although 1.3 x 2.2 cm fluid collection in the left mid anterior abdominal wall persists (series 2/image 58), previously 1.6 x 2.9 cm. Musculoskeletal: Visualized osseous structures are within normal limits. IMPRESSION: Improving fluid collections along the anterior abdominal wall, with a residual 1.3 x 2.2 cm fluid collection on the left. Three associated fistulous tracts, as described above, grossly unchanged. Dilated loops of small bowel in the left mid abdomen, with associated transition point beneath the right mid abdominal wall, compatible with small bowel obstruction. Additional stable postsurgical and ancillary findings as above. Electronically Signed   By: Julian Hy M.D.   On: 05/16/2017 21:19   Dg Hip Port Unilat With Pelvis 1v Left  Result Date: 05/16/2017 CLINICAL DATA:  52 year old female with history of knot on the lateral aspect of the left hip. EXAM: DG HIP (WITH OR WITHOUT PELVIS) 1V PORT LEFT COMPARISON:  Pelvis and left hip radiograph 11/01/2016. FINDINGS: There is no evidence of hip fracture or dislocation. There is no evidence of arthropathy or other focal bone abnormality. IMPRESSION: Negative. Electronically Signed   By: Vinnie Langton M.D.   On: 05/16/2017 13:03      Assessment/Plan: Angel Price is a 52 yo known to [facility] due to her chronic drain subcutaneous fistulas from her prior surgeries/ history of Mesh, who also was found to have a SBO no Ct scan performed after Dr. Arnoldo Morale saw her yesterday.  She has been eating but not able to take in much. She has not had a BM in several days.  -NPO, NG to be placed -Continue IVF -Non operative management at this time, would give it quite a few days to see if can resolve without operative intervention given her history  -No indication for OR at this time, minimally tender, and no leukocytosis / vitals wnl   LOS: 7 days    Angel Price 05/17/2017

## 2017-05-17 NOTE — Progress Notes (Signed)
Patient ID: Angel Price, female   DOB: 1965/05/23, 52 y.o.   MRN: [id number]   Assessment/Plan: ADMITTED WITH ABDOMINAL PAIN, NAUSEA, & VOMITING. UNABLE TO ADVANCE DIET AND CT MAR 1 SHOWS HIGH GRADE SOB.  PLAN: 1. NPO EXCEPT ICE CHIPS. PLACE NG TUBE. PT WOULD LIKE ICE WATER DURING INSERTION. 2. SURGERY CONSULT PENDING. 3. PROTONIX BID 4. DISCUSSED PLAN WITH PT & DR. Constance Price.   Subjective: Since I last evaluated the patient Angel Price. SHE HAD LARGE EPISODE OF EMESIS(NO BLOOD)LAST NIGHT.  Objective: Vital signs in last 24 hours: Vitals:   05/16/17 2202 05/17/17 0608  BP: 111/76 112/83  Pulse: 94 78  Resp: 20 20  Temp: 98.6 F (37 C) 98.3 F (36.8 C)  SpO2: 98% 94%   General appearance: alert, cooperative and no distress Resp: clear to auscultation bilaterally Cardio: regular rate and rhythm GI: soft, tenderness IN ALL 4 QUADRANTS WITH GUARDING, DISTENDED; HYPOACTIVE bowel sounds, DRESSING IN PLACE  Lab Results: K 3.8 Cr 0.74 Hb 9.8 PLT CT 236 WBC 5.2     Studies/Results: Ct Abdomen Pelvis W Contrast  Result Date: 05/16/2017 CLINICAL DATA:  History of surgery with open wounds with drainage EXAM: CT ABDOMEN AND PELVIS WITH CONTRAST TECHNIQUE: Multidetector CT imaging of the abdomen and pelvis was performed using the standard protocol following bolus administration of intravenous contrast. CONTRAST:  135mL ISOVUE-300 IOPAMIDOL (ISOVUE-300) INJECTION 61% COMPARISON:  05/10/2017 FINDINGS: Lower chest: Linear scarring/atelectasis in the right upper lobe, lingula, and left lower lobe. Calcified granuloma in the right middle lobe. Hepatobiliary: Liver is within normal limits. Status post cholecystectomy. Mild intrahepatic ductal prominence. Dilated common duct, measuring 12 mm although tapering at the ampulla, likely postsurgical. Pancreas: Within normal limits. Spleen: Within normal limits. Adrenals/Urinary Tract: 18 mm left adrenal adenoma. Right  adrenal gland is not well visualized. Surgical clips in the right upper abdomen. Kidneys within normal limits.  No hydronephrosis. Bladder is underdistended but unremarkable. Stomach/Bowel: Postsurgical changes involving the stomach with additional surgical clips at the GE junction. Dilated loops of small bowel in the left mid abdomen. Fecalization in the right mid abdomen, leading to a transition point beneath the right mid abdominal wall (series 2/image 58). Decompressed loops of distal/terminal ileum in the right lower quadrant (series 2/image 62). These findings are compatible with high-grade small bowel obstruction. Vascular/Lymphatic: No evidence of abdominal aortic aneurysm. Atherosclerotic calcifications of the abdominal aorta and branch vessels. No suspicious abdominopelvic lymphadenopathy. Reproductive: Status post hysterectomy. No adnexal masses. Other: No abdominopelvic ascites. Three fistulous tracts along the right mid abdominal wall (series 2/image 55), left mid abdominal wall (series 2/images 60-61), and right periumbilical region (series 2/images 63-64). Underlying fluid collections along the anterior abdominal wall have improved, although 1.3 x 2.2 cm fluid collection in the left mid anterior abdominal wall persists (series 2/image 58), previously 1.6 x 2.9 cm. Musculoskeletal: Visualized osseous structures are within normal limits. IMPRESSION: Improving fluid collections along the anterior abdominal wall, with a residual 1.3 x 2.2 cm fluid collection on the left. Three associated fistulous tracts, as described above, grossly unchanged. Dilated loops of small bowel in the left mid abdomen, with associated transition point beneath the right mid abdominal wall, compatible with small bowel obstruction. Additional stable postsurgical and ancillary findings as above. Electronically Signed   By: Angel Price M.D.   On: 05/16/2017 21:19   Dg Hip Port Unilat With Pelvis 1v Left  Result Date:  05/16/2017 CLINICAL DATA:  52 year old female with history  of knot on the lateral aspect of the left hip. EXAM: DG HIP (WITH OR WITHOUT PELVIS) 1V PORT LEFT COMPARISON:  Pelvis and left hip radiograph 11/01/2016. FINDINGS: There is no evidence of hip fracture or dislocation. There is no evidence of arthropathy or other focal bone abnormality. IMPRESSION: Negative. Electronically Signed   By: Angel Price M.D.   On: 05/16/2017 13:03    Medications: I have reviewed the patient's current medications.   LOS: 5 days   Angel Price 08/26/2013, 2:23 PM

## 2017-05-18 LAB — BASIC METABOLIC PANEL
ANION GAP: 8 (ref 5–15)
BUN: 9 mg/dL (ref 6–20)
CHLORIDE: 106 mmol/L (ref 101–111)
CO2: 24 mmol/L (ref 22–32)
Calcium: 8.5 mg/dL — ABNORMAL LOW (ref 8.9–10.3)
Creatinine, Ser: 0.42 mg/dL — ABNORMAL LOW (ref 0.44–1.00)
GFR calc Af Amer: >60 mL/min (ref >60)
GFR calc non Af Amer: >60 mL/min (ref >60)
Glucose, Bld: 92 mg/dL (ref 65–99)
POTASSIUM: 3.6 mmol/L (ref 3.5–5.1)
Sodium: 138 mmol/L (ref 135–145)

## 2017-05-18 LAB — CBC
HEMATOCRIT: 25.8 % — AB (ref 36.0–46.0)
HEMOGLOBIN: 8.6 g/dL — AB (ref 12.0–15.0)
MCH: 27.2 pg (ref 26.0–34.0)
MCHC: 33.3 g/dL (ref 30.0–36.0)
MCV: 81.6 fL (ref 78.0–100.0)
Platelets: 258 10*3/uL (ref 150–400)
RBC: 3.16 MIL/uL — ABNORMAL LOW (ref 3.87–5.11)
RDW: 19 % — ABNORMAL HIGH (ref 11.5–15.5)
WBC: 4.9 10*3/uL (ref 4.0–10.5)

## 2017-05-18 MED ORDER — DIPHENHYDRAMINE HCL 50 MG/ML IJ SOLN
12.5000 mg | Freq: Once | INTRAMUSCULAR | Status: AC
  Administered 2017-05-18: 12.5 mg via INTRAVENOUS
  Filled 2017-05-18: qty 1

## 2017-05-18 NOTE — Progress Notes (Signed)
PROGRESS NOTE    [name]  QQP:[id number] DOB: 1965/12/07 DOA: 05/09/2017 PCP: Rosita Fire, MD    Brief Narrative:  52 year old female with a history of gastric bypass, chronic periumbilical fistulous, hypertension, presented to the hospital with complaints of rectal bleeding.  Seen by gastroenterology and felt as this was likely hemorrhoidal.  She continues to have nausea and vomiting and CT scan indicated small bowel obstruction.  General surgery following.   Assessment & Plan:   Principal Problem:   Acute GI bleeding Active Problems:   Bipolar disorder ([place])   Hypothyroid   Essential hypertension, benign   Rectal bleeding   MDD (major depressive disorder), recurrent episode, severe (HCC)   Acute blood loss anemia   Hypokalemia   Chronic wound infection of abdomen   HTN (hypertension)   Absolute anemia   Abdominal wall fistula   Folate deficiency   Non-intractable vomiting with nausea   SBO (small bowel obstruction) ([place])   1. Rectal bleeding.  Suspect is related to hemorrhoids.  Hemoglobin has been relatively stable.  Currently 8.6.  Continue to monitor.  Continue Anusol. 2. Small bowel obstruction.  Noted on CT scan.  General surgery following.  NG tube placed for decompression.  She is not having any bowel movements or flatus.  Continue current treatments. 3. Hypokalemia/hypomagnesemia.  Resolved with replacement. 4. Abdominal wounds in the setting of previous abdominal surgeries.  Seen by general surgery and no further antibiotics recommended at this time.  Appears to be chronic.  Recommended to keep wounds clean.  Follow-up with general surgery as an outpatient. 5. Normocytic anemia.  Secondary to iron deficiency versus acute blood loss.  Continue on iron supplementation.  Monitor CBC 6. Depression/anxiety.  Continue on Lexapro   DVT prophylaxis: SCDs Code Status: Full code Family Communication: No family present Disposition Plan: Discharge home once  improved  Consultants:   Gastroenterology  General surgery  Procedures:     Antimicrobials:      Subjective: Has not had a bowel movement.  No vomiting.  NG tube draining.  She has not had any flatus today.  Continues to have some abdominal pain.  Objective: Vitals:   05/17/17 1300 05/17/17 2249 05/18/17 0529 05/18/17 1610  BP: 115/79 (!) 133/96 132/83 (!) 136/98  Pulse: 80 76 77 (!) 54  Resp: 20 19 14 14   Temp: 98.6 F (37 C) 98.6 F (37 C) 98.4 F (36.9 C) 99.1 F (37.3 C)  TempSrc:  Oral Oral Oral  SpO2:  95% 95% 95%  Weight:      Height:        Intake/Output Summary (Last 24 hours) at 05/18/2017 1742 Last data filed at 05/18/2017 1549 Gross per 24 hour  Intake 1200 ml  Output 600 ml  Net 600 ml   Filed Weights   05/09/17 2120 05/10/17 2353  Weight: 61.7 kg (136 lb) 65.4 kg (144 lb 2.9 oz)    Examination:  General exam: Alert, awake, oriented x 3, NG tube in place Respiratory system: Clear bilaterally.  Respiratory effort normal. Cardiovascular system: Regular rate and rhythm. No murmurs, rubs, gallops. Gastrointestinal system: Abdomen is nondistended, soft and diffusely tender. No organomegaly or masses felt. Normal bowel sounds heard. Central nervous system: Alert and oriented. No focal neurological deficits. Extremities: No C/C/E, +pedal pulses Skin: Wounds and abdominal wall, some draining purulent fluid.  No surrounding erythema.  Appears chronic. Psychiatry: Judgement and insight appear normal. Mood & affect appropriate.      Data Reviewed: I  have personally reviewed following labs and imaging studies  CBC: Recent Labs  Lab 05/13/17 1047 05/14/17 0510 05/15/17 0515 05/16/17 0759 05/18/17 0658  WBC 5.3 5.9 7.3 5.2 4.9  HGB 9.6* 9.7* 8.9* 9.8* 8.6*  HCT 29.2* 30.2* 27.2* 30.4* 25.8*  MCV 82.0 83.0 81.4 82.6 81.6  PLT 251 244 240 236 585   Basic Metabolic Panel: Recent Labs  Lab 05/12/17 0554 05/13/17 0652 05/14/17 0510  05/15/17 0515 05/16/17 0759 05/18/17 0658  NA 139 138 139 138 135 138  K 3.8 3.4* 3.4* 3.3* 3.8 3.6  CL 110 106 106 105 100* 106  CO2 23 22 23 23 25 24   GLUCOSE 89 99 141* 104* 95 92  BUN <5* 5* 6 7 10 9   CREATININE 0.53 0.46 0.57 0.64 0.74 0.42*  CALCIUM 8.6* 8.5* 9.0 8.7* 9.0 8.5*  MG 1.8 1.6* 1.6*  --   --   --    GFR: Estimated Creatinine Clearance: 77.4 mL/min (A) (by C-G formula based on SCr of 0.42 mg/dL (L)). Liver Function Tests: Recent Labs  Lab 05/13/17 1330  AST 37  ALT 20  ALKPHOS 83  BILITOT 0.4  PROT 6.3*  ALBUMIN 3.1*   Recent Labs  Lab 05/13/17 1330  LIPASE 19   No results for input(s): AMMONIA in the last 168 hours. Coagulation Profile: No results for input(s): INR, PROTIME in the last 168 hours. Cardiac Enzymes: No results for input(s): CKTOTAL, CKMB, CKMBINDEX, TROPONINI in the last 168 hours. BNP (last 3 results) No results for input(s): PROBNP in the last 8760 hours. HbA1C: No results for input(s): HGBA1C in the last 72 hours. CBG: No results for input(s): GLUCAP in the last 168 hours. Lipid Profile: No results for input(s): CHOL, HDL, LDLCALC, TRIG, CHOLHDL, LDLDIRECT in the last 72 hours. Thyroid Function Tests: No results for input(s): TSH, T4TOTAL, FREET4, T3FREE, THYROIDAB in the last 72 hours. Anemia Panel: No results for input(s): VITAMINB12, FOLATE, FERRITIN, TIBC, IRON, RETICCTPCT in the last 72 hours. Sepsis Labs: No results for input(s): PROCALCITON, LATICACIDVEN in the last 168 hours.  Recent Results (from the past 240 hour(s))  MRSA PCR Screening     Status: None   Collection Time: 05/11/17 12:10 AM  Result Value Ref Range Status   MRSA by PCR NEGATIVE NEGATIVE Final    Comment:        The GeneXpert MRSA Assay (FDA approved for NASAL specimens only), is one component of a comprehensive MRSA colonization surveillance program. It is not intended to diagnose MRSA infection nor to guide or monitor treatment for MRSA  infections. Performed at [facility], [address], [place], [place] [address]   Aerobic Culture (superficial specimen)     Status: None (Preliminary result)   Collection Time: 05/16/17 12:40 PM  Result Value Ref Range Status   Specimen Description   Final    ABDOMEN Performed at [facility], [address], [place], [place] [address]    Special Requests   Final    Normal Performed at [facility], [address] [place], [place] [address]    Gram Stain   Final    FEW WBC PRESENT,BOTH PMN AND MONONUCLEAR NO ORGANISMS SEEN Performed at [facility], [place] [address] [place], [place] [address]    Culture FEW STAPHYLOCOCCUS AUREUS  Final   Report Status PENDING  Incomplete         Radiology Studies: Ct Abdomen Pelvis W Contrast  Result Date: 05/16/2017 CLINICAL DATA:  History of  surgery with open wounds with drainage EXAM: CT ABDOMEN AND PELVIS WITH CONTRAST TECHNIQUE: Multidetector CT imaging of the abdomen and pelvis was performed using the standard protocol following bolus administration of intravenous contrast. CONTRAST:  147mL ISOVUE-300 IOPAMIDOL (ISOVUE-300) INJECTION 61% COMPARISON:  05/10/2017 FINDINGS: Lower chest: Linear scarring/atelectasis in the right upper lobe, lingula, and left lower lobe. Calcified granuloma in the right middle lobe. Hepatobiliary: Liver is within normal limits. Status post cholecystectomy. Mild intrahepatic ductal prominence. Dilated common duct, measuring 12 mm although tapering at the ampulla, likely postsurgical. Pancreas: Within normal limits. Spleen: Within normal limits. Adrenals/Urinary Tract: 18 mm left adrenal adenoma. Right adrenal gland is not well visualized. Surgical clips in the right upper abdomen. Kidneys within normal limits.  No hydronephrosis. Bladder is underdistended but unremarkable. Stomach/Bowel: Postsurgical changes involving the stomach with additional surgical clips at the GE junction. Dilated loops of small bowel in the  left mid abdomen. Fecalization in the right mid abdomen, leading to a transition point beneath the right mid abdominal wall (series 2/image 58). Decompressed loops of distal/terminal ileum in the right lower quadrant (series 2/image 62). These findings are compatible with high-grade small bowel obstruction. Vascular/Lymphatic: No evidence of abdominal aortic aneurysm. Atherosclerotic calcifications of the abdominal aorta and branch vessels. No suspicious abdominopelvic lymphadenopathy. Reproductive: Status post hysterectomy. No adnexal masses. Other: No abdominopelvic ascites. Three fistulous tracts along the right mid abdominal wall (series 2/image 55), left mid abdominal wall (series 2/images 60-61), and right periumbilical region (series 2/images 63-64). Underlying fluid collections along the anterior abdominal wall have improved, although 1.3 x 2.2 cm fluid collection in the left mid anterior abdominal wall persists (series 2/image 58), previously 1.6 x 2.9 cm. Musculoskeletal: Visualized osseous structures are within normal limits. IMPRESSION: Improving fluid collections along the anterior abdominal wall, with a residual 1.3 x 2.2 cm fluid collection on the left. Three associated fistulous tracts, as described above, grossly unchanged. Dilated loops of small bowel in the left mid abdomen, with associated transition point beneath the right mid abdominal wall, compatible with small bowel obstruction. Additional stable postsurgical and ancillary findings as above. Electronically Signed   By: Julian Hy M.D.   On: 05/16/2017 21:19   Dg Chest Port 1 View  Result Date: 05/17/2017 CLINICAL DATA:  Nasogastric tube placement EXAM: PORTABLE CHEST 1 VIEW COMPARISON:  05/14/2017 FINDINGS: There is a nasogastric tube coiled in the fundus of the stomach. There is no focal consolidation. There is no pleural effusion or pneumothorax. The heart and mediastinal contours are unremarkable. The osseous structures are  unremarkable. IMPRESSION: Nasogastric tube coiled in the fundus of the stomach. Electronically Signed   By: Kathreen Devoid   On: 05/17/2017 12:22        Scheduled Meds: . amLODipine  5 mg Oral Daily  . hydrocortisone   Rectal QID  . lisinopril  20 mg Oral Daily  . pantoprazole (PROTONIX) IV  40 mg Intravenous Q12H   Continuous Infusions: . dextrose 5 % and 0.9 % NaCl with KCl 20 mEq/L 50 mL/hr at 05/17/17 1834     LOS: 8 days    Time spent: 25 minutes    Kathie Dike, MD [facility] Pager [phone]  If 7PM-7AM, please contact night-coverage [email or web address] Password [facility] 05/18/2017, 5:42 PM

## 2017-05-18 NOTE — Progress Notes (Signed)
[facility] Progress Note  7 Days Post-Op  Subjective: No major complaints but says she does not feel well. Ng with minimal output about 400cc in canister. No flatus reported.   Objective: Vital signs in last 24 hours: Temp:  [98.4 F (36.9 C)-98.6 F (37 C)] 98.4 F (36.9 C) (03/03 0529) Pulse Rate:  [76-77] 77 (03/03 0529) Resp:  [14-19] 14 (03/03 0529) BP: (132-133)/(83-96) 132/83 (03/03 0529) SpO2:  [95 %] 95 % (03/03 0529) Last BM Date: (Patient is unsure when her last BW )  Intake/Output from previous day: 03/02 0701 - 03/03 0700 In: 1200 [I.V.:1200] Out: 100 [Urine:100] Intake/Output this shift: No intake/output data recorded.  General appearance: alert, cooperative and no distress Resp: normal work breathing GI: soft, minimally distended, tender in the mid abdomen around fistula tracks   Lab Results:  Recent Labs    05/16/17 0759 05/18/17 0658  WBC 5.2 4.9  HGB 9.8* 8.6*  HCT 30.4* 25.8*  PLT 236 258   BMET Recent Labs    05/16/17 0759 05/18/17 0658  NA 135 138  K 3.8 3.6  CL 100* 106  CO2 25 24  GLUCOSE 95 92  BUN 10 9  CREATININE 0.74 0.42*  CALCIUM 9.0 8.5*    Assessment/Plan: Ms. Uselton is a 52 yo known to [facility] due to her chronic drain subcutaneous fistulas from her prior surgeries/ history of Mesh, who also was found to have a SBO.  -NPO, NG to be placed, minimal output  -Continue IVF -Non operative management at this time, would give it quite a few days to see if can resolve without operative intervention given her history      LOS: 8 days    Virl Cagey 05/18/2017

## 2017-05-19 DIAGNOSIS — K56609 Unspecified intestinal obstruction, unspecified as to partial versus complete obstruction: Secondary | ICD-10-CM

## 2017-05-19 LAB — CBC
HCT: 28.4 % — ABNORMAL LOW (ref 36.0–46.0)
Hemoglobin: 9.4 g/dL — ABNORMAL LOW (ref 12.0–15.0)
MCH: 26.6 pg (ref 26.0–34.0)
MCHC: 33.1 g/dL (ref 30.0–36.0)
MCV: 80.2 fL (ref 78.0–100.0)
PLATELETS: 278 10*3/uL (ref 150–400)
RBC: 3.54 MIL/uL — AB (ref 3.87–5.11)
RDW: 18.1 % — ABNORMAL HIGH (ref 11.5–15.5)
WBC: 5.4 10*3/uL (ref 4.0–10.5)

## 2017-05-19 LAB — AEROBIC CULTURE W GRAM STAIN (SUPERFICIAL SPECIMEN): Special Requests: NORMAL

## 2017-05-19 LAB — AEROBIC CULTURE  (SUPERFICIAL SPECIMEN)

## 2017-05-19 MED ORDER — MILK AND MOLASSES ENEMA
1.0000 | Freq: Once | RECTAL | Status: AC
  Administered 2017-05-19: 250 mL via RECTAL

## 2017-05-19 NOTE — Progress Notes (Signed)
    Subjective: Feels abdominal pain is worse. Nauseated. No flatus per patient. States she is tired of this and wants surgery. Discussed with nursing staff. Output of 100 ml from start of shift yesterday morning till about 1600. Seems to be decreasing. Rectal bleeding improved. Still frequent but less amounts.    Objective: Vital signs in last 24 hours: Temp:  [98.4 F (36.9 C)-99.1 F (37.3 C)] 98.5 F (36.9 C) (03/04 0600) Pulse Rate:  [54-72] 66 (03/04 0600) Resp:  [14-15] 14 (03/04 0600) BP: (136-152)/(90-98) 152/90 (03/04 0600) SpO2:  [95 %-97 %] 96 % (03/04 0600) Last BM Date: (Patient is unsure when her last BW ) General:   Alert and oriented, pleasant Head:  Normocephalic and atraumatic. Abdomen:  Bowel sounds hypoactive, TTP upper abdomen, soft Neurologic:  Alert and  oriented x4 Psych:  Alert and cooperative. Flat affect   Intake/Output from previous day: 03/03 0701 - 03/04 0700 In: -  Out: 500 [Urine:400; Emesis/NG output:100] Intake/Output this shift: No intake/output data recorded.  Lab Results: Recent Labs    05/18/17 0658 05/19/17 0617  WBC 4.9 5.4  HGB 8.6* 9.4*  HCT 25.8* 28.4*  PLT 258 278   BMET Recent Labs    05/18/17 0658  NA 138  K 3.6  CL 106  CO2 24  GLUCOSE 92  BUN 9  CREATININE 0.42*  CALCIUM 8.5*     Studies/Results: Dg Chest Port 1 View  Result Date: 05/17/2017 CLINICAL DATA:  Nasogastric tube placement EXAM: PORTABLE CHEST 1 VIEW COMPARISON:  05/14/2017 FINDINGS: There is a nasogastric tube coiled in the fundus of the stomach. There is no focal consolidation. There is no pleural effusion or pneumothorax. The heart and mediastinal contours are unremarkable. The osseous structures are unremarkable. IMPRESSION: Nasogastric tube coiled in the fundus of the stomach. Electronically Signed   By: Kathreen Devoid   On: 05/17/2017 12:22    Assessment: 52 year old female with complicated past medical history to include pancreatitis, history  of ETOH abuse, prior BIllroth II, anemia of chronic disease, gastroparesis, GERD, chronic abdominal pain. 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 Last colonoscopy in June 2018 with normal TI, multiple diverticula, large non-bleeding internal hemorrhoidswith surgical eval for hemorrhoidectomy recommended. EGD this admissionwith normal esophagus, previous placed hemostasis clips remained in place. No specimens collected.   Now with SBO, Surgery following. Conservative measures at this time. NGT in place with decreasing output. Abdominal pain unchanged, and she denies flatus. Discussed with patient need for exhausting all non-surgical approaches first.   Rectal bleeding: improved. Continue Anusol.   Plan: PPI BID NPO, NGT in place Continue Anusol  Outpatient referrals to hematology due to chronic anemia, behavior medicine (depression) Will follow peripherally, with outpatient follow-up on schedule for April 29th at [place], PhD, ANP-BC [facility]     LOS: 9 days    05/19/2017, 8:11 AM

## 2017-05-19 NOTE — Progress Notes (Signed)
PROGRESS NOTE    Angel Price  NKN:[id number] DOB: 08-24-1965 DOA: 05/09/2017 PCP: Rosita Fire, MD    Brief Narrative:  52 year old female with a history of gastric bypass, chronic periumbilical fistulous, hypertension, presented to the hospital with complaints of rectal bleeding.  Seen by gastroenterology and felt as this was likely hemorrhoidal.  She continues to have nausea and vomiting and CT scan indicated small bowel obstruction.  General surgery following.   Assessment & Plan:   Principal Problem:   Acute GI bleeding Active Problems:   Bipolar disorder ([place])   Hypothyroid   Essential hypertension, benign   Rectal bleeding   MDD (major depressive disorder), recurrent episode, severe (HCC)   Acute blood loss anemia   Hypokalemia   Chronic wound infection of abdomen   HTN (hypertension)   Absolute anemia   Abdominal wall fistula   Folate deficiency   Non-intractable vomiting with nausea   SBO (small bowel obstruction) ([place])   1. Rectal bleeding.  Suspect is related to hemorrhoids.  Hemoglobin has been relatively stable.  Currently 9.4.  Continue to monitor.  Continue Anusol. 2. Small bowel obstruction.  Noted on CT scan.  General surgery following.  NG tube placed for decompression.  She is not having any bowel movements or flatus.  Glasses enema has been ordered. Continue current treatments. 3. Hypokalemia/hypomagnesemia.  Resolved with replacement. 4. Abdominal wounds in the setting of previous abdominal surgeries.  Seen by general surgery and no further antibiotics recommended at this time.  Appears to be chronic.  Recommended to keep wounds clean.  Follow-up with general surgery as an outpatient. 5. Normocytic anemia.  Secondary to iron deficiency versus acute blood loss.  Continue on iron supplementation.  Monitor CBC 6. Depression/anxiety.  Continue on Lexapro   DVT prophylaxis: SCDs Code Status: Full code Family Communication: No family present Disposition  Plan: Discharge home once improved  Consultants:   Gastroenterology  General surgery  Procedures:     Antimicrobials:      Subjective: No bowel movement or flatus.  No vomiting.  NG tube remains in place.  Objective: Vitals:   05/18/17 0529 05/18/17 1610 05/18/17 2300 05/19/17 0600  BP: 132/83 (!) 136/98 (!) 137/91 (!) 152/90  Pulse: 77 (!) 54 72 66  Resp: 14 14 15 14   Temp: 98.4 F (36.9 C) 99.1 F (37.3 C) 98.4 F (36.9 C) 98.5 F (36.9 C)  TempSrc: Oral Oral Oral Oral  SpO2: 95% 95% 97% 96%  Weight:      Height:        Intake/Output Summary (Last 24 hours) at 05/19/2017 1104 Last data filed at 05/19/2017 1057 Gross per 24 hour  Intake -  Output 1600 ml  Net -1600 ml   Filed Weights   05/09/17 2120 05/10/17 2353  Weight: 61.7 kg (136 lb) 65.4 kg (144 lb 2.9 oz)    Examination:  General exam: Alert, awake, oriented x 3 Respiratory system: Clear to auscultation. Respiratory effort normal. Cardiovascular system:RRR. No murmurs, rubs, gallops. Gastrointestinal system: Abdomen is nondistended, soft, diffusely tender.  Dressing on surface of abdomen with abdominal wounds. No organomegaly or masses felt.  Bowel sounds sluggish. Central nervous system: Alert and oriented. No focal neurological deficits. Extremities: No C/C/E, +pedal pulses Skin: Wounds on abdominal wall draining purulent fluid, no surrounding erythema Psychiatry: Judgement and insight appear normal. Mood & affect appropriate.   Data Reviewed: I have personally reviewed following labs and imaging studies  CBC: Recent Labs  Lab 05/14/17 0510 05/15/17  7341 05/16/17 0759 05/18/17 0658 05/19/17 0617  WBC 5.9 7.3 5.2 4.9 5.4  HGB 9.7* 8.9* 9.8* 8.6* 9.4*  HCT 30.2* 27.2* 30.4* 25.8* 28.4*  MCV 83.0 81.4 82.6 81.6 80.2  PLT 244 240 236 258 937   Basic Metabolic Panel: Recent Labs  Lab 05/13/17 0652 05/14/17 0510 05/15/17 0515 05/16/17 0759 05/18/17 0658  NA 138 139 138 135 138  K  3.4* 3.4* 3.3* 3.8 3.6  CL 106 106 105 100* 106  CO2 22 23 23 25 24   GLUCOSE 99 141* 104* 95 92  BUN 5* 6 7 10 9   CREATININE 0.46 0.57 0.64 0.74 0.42*  CALCIUM 8.5* 9.0 8.7* 9.0 8.5*  MG 1.6* 1.6*  --   --   --    GFR: Estimated Creatinine Clearance: 77.4 mL/min (A) (by C-G formula based on SCr of 0.42 mg/dL (L)). Liver Function Tests: Recent Labs  Lab 05/13/17 1330  AST 37  ALT 20  ALKPHOS 83  BILITOT 0.4  PROT 6.3*  ALBUMIN 3.1*   Recent Labs  Lab 05/13/17 1330  LIPASE 19   No results for input(s): AMMONIA in the last 168 hours. Coagulation Profile: No results for input(s): INR, PROTIME in the last 168 hours. Cardiac Enzymes: No results for input(s): CKTOTAL, CKMB, CKMBINDEX, TROPONINI in the last 168 hours. BNP (last 3 results) No results for input(s): PROBNP in the last 8760 hours. HbA1C: No results for input(s): HGBA1C in the last 72 hours. CBG: No results for input(s): GLUCAP in the last 168 hours. Lipid Profile: No results for input(s): CHOL, HDL, LDLCALC, TRIG, CHOLHDL, LDLDIRECT in the last 72 hours. Thyroid Function Tests: No results for input(s): TSH, T4TOTAL, FREET4, T3FREE, THYROIDAB in the last 72 hours. Anemia Panel: No results for input(s): VITAMINB12, FOLATE, FERRITIN, TIBC, IRON, RETICCTPCT in the last 72 hours. Sepsis Labs: No results for input(s): PROCALCITON, LATICACIDVEN in the last 168 hours.  Recent Results (from the past 240 hour(s))  MRSA PCR Screening     Status: None   Collection Time: 05/11/17 12:10 AM  Result Value Ref Range Status   MRSA by PCR NEGATIVE NEGATIVE Final    Comment:        The GeneXpert MRSA Assay (FDA approved for NASAL specimens only), is one component of a comprehensive MRSA colonization surveillance program. It is not intended to diagnose MRSA infection nor to guide or monitor treatment for MRSA infections. Performed at [facility], [address], [place], [place] [address]   Aerobic Culture  (superficial specimen)     Status: None (Preliminary result)   Collection Time: 05/16/17 12:40 PM  Result Value Ref Range Status   Specimen Description   Final    ABDOMEN Performed at [facility], [address] [place], [place] [address]    Special Requests   Final    Normal Performed at [facility], [address], [place], [place] [address]    Gram Stain   Final    FEW WBC PRESENT,BOTH PMN AND MONONUCLEAR NO ORGANISMS SEEN Performed at [facility], [place] [address] [place], [place] [address]    Culture FEW STAPHYLOCOCCUS AUREUS  Final   Report Status PENDING  Incomplete         Radiology Studies: Dg Chest Port 1 View  Result Date: 05/17/2017 CLINICAL DATA:  Nasogastric tube placement EXAM: PORTABLE CHEST 1 VIEW COMPARISON:  05/14/2017 FINDINGS: There is a nasogastric tube coiled in the fundus of the stomach. There is no focal consolidation. There  is no pleural effusion or pneumothorax. The heart and mediastinal contours are unremarkable. The osseous structures are unremarkable. IMPRESSION: Nasogastric tube coiled in the fundus of the stomach. Electronically Signed   By: Kathreen Devoid   On: 05/17/2017 12:22        Scheduled Meds: . amLODipine  5 mg Oral Daily  . hydrocortisone   Rectal QID  . lisinopril  20 mg Oral Daily  . pantoprazole (PROTONIX) IV  40 mg Intravenous Q12H   Continuous Infusions: . dextrose 5 % and 0.9 % NaCl with KCl 20 mEq/L 50 mL/hr at 05/17/17 1834     LOS: 9 days    Time spent: 25 minutes    Kathie Dike, MD [facility] Pager [phone]  If 7PM-7AM, please contact night-coverage [email or web address] Password [email or web address] 05/19/2017, 11:04 AM

## 2017-05-19 NOTE — Progress Notes (Signed)
8 Days Post-Op  Subjective: Patient denies having a bowel movement.  NG tube in place.  Objective: Vital signs in last 24 hours: Temp:  [98.4 F (36.9 C)-99.1 F (37.3 C)] 98.5 F (36.9 C) (03/04 0600) Pulse Rate:  [54-72] 66 (03/04 0600) Resp:  [14-15] 14 (03/04 0600) BP: (136-152)/(90-98) 152/90 (03/04 0600) SpO2:  [95 %-97 %] 96 % (03/04 0600) Last BM Date: (Patient is unsure when her last BW )  Intake/Output from previous day: 03/03 0701 - 03/04 0700 In: -  Out: 500 [Urine:400; Emesis/NG output:100] Intake/Output this shift: No intake/output data recorded.  General appearance: alert, cooperative and no distress GI: Soft without significant distention noted.  No rigidity noted.  Tender around the midportion of her abdomen where her chronically draining sinuses are.  Lab Results:  Recent Labs    05/18/17 0658 05/19/17 0617  WBC 4.9 5.4  HGB 8.6* 9.4*  HCT 25.8* 28.4*  PLT 258 278   BMET Recent Labs    05/18/17 0658  NA 138  K 3.6  CL 106  CO2 24  GLUCOSE 92  BUN 9  CREATININE 0.42*  CALCIUM 8.5*   PT/INR No results for input(s): LABPROT, INR in the last 72 hours.  Studies/Results: Dg Chest Port 1 View  Result Date: 05/17/2017 CLINICAL DATA:  Nasogastric tube placement EXAM: PORTABLE CHEST 1 VIEW COMPARISON:  05/14/2017 FINDINGS: There is a nasogastric tube coiled in the fundus of the stomach. There is no focal consolidation. There is no pleural effusion or pneumothorax. The heart and mediastinal contours are unremarkable. The osseous structures are unremarkable. IMPRESSION: Nasogastric tube coiled in the fundus of the stomach. 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  There is a significant amount of stool in the colon as well as some equalization of the distal small  bowel.  We will try to avoid exploration if at all possible given her multiple abdominal surgeries in the past.  Have ordered molasses enema.  Continue NG tube decompression.  LOS: 9 days    Aviva Signs 05/19/2017

## 2017-05-20 MED ORDER — MILK AND MOLASSES ENEMA
1.0000 | Freq: Once | RECTAL | Status: AC
  Administered 2017-05-20: 250 mL via RECTAL

## 2017-05-20 MED ORDER — POLYETHYLENE GLYCOL 3350 17 G PO PACK
17.0000 g | PACK | Freq: Every day | ORAL | Status: DC
  Administered 2017-05-20 – 2017-05-23 (×4): 17 g via ORAL
  Filled 2017-05-20 (×4): qty 1

## 2017-05-20 NOTE — Plan of Care (Signed)
Pt resting with eyes closed  Will continue to monitor

## 2017-05-20 NOTE — Progress Notes (Signed)
9 Days Post-Op  Subjective: Patient had good results with enema.  States her abdominal pain is less.  Objective: Vital signs in last 24 hours: Temp:  [98.3 F (36.8 C)-98.5 F (36.9 C)] 98.5 F (36.9 C) (03/05 0459) Pulse Rate:  [63-69] 63 (03/05 0459) Resp:  [15-18] 18 (03/05 0459) BP: (148-156)/(92-99) 148/93 (03/05 0459) SpO2:  [97 %-98 %] 97 % (03/05 0459) Last BM Date: 05/19/17  Intake/Output from previous day: 03/04 0701 - 03/05 0700 In: 2350 [I.V.:2350] Out: 3000 [Urine:3000] Intake/Output this shift: No intake/output data recorded.  General appearance: alert, cooperative and no distress GI: Soft.  Chronic draining sinuses present.  No rigidity noted.  Bowel sounds present.  Lab Results:  Recent Labs    05/18/17 0658 05/19/17 0617  WBC 4.9 5.4  HGB 8.6* 9.4*  HCT 25.8* 28.4*  PLT 258 278   BMET Recent Labs    05/18/17 0658  NA 138  K 3.6  CL 106  CO2 24  GLUCOSE 92  BUN 9  CREATININE 0.42*  CALCIUM 8.5*   PT/INR No results for input(s): LABPROT, INR in the last 72 hours.  Studies/Results: No results found.  Anti-infectives: Anti-infectives (From admission, onward)   None      Assessment/Plan: Impression: Partial small bowel obstruction.  Slowly resolving. Plan: We will remove NG tube.  Advance to clear liquid diet.  Encourage ambulation.  Will give MiraLAX.  Repeat enema.  LOS: 10 days    Aviva Signs 05/20/2017

## 2017-05-20 NOTE — Progress Notes (Signed)
PROGRESS NOTE    Angel Price  DTO:[id number] DOB: 1965/05/04 DOA: 05/09/2017 PCP: Rosita Fire, MD    Brief Narrative:  52 year old female with a history of gastric bypass, chronic periumbilical fistulous, hypertension, presented to the hospital with complaints of rectal bleeding.  Seen by gastroenterology and felt as this was likely hemorrhoidal.  She continues to have nausea and vomiting and CT scan indicated small bowel obstruction.  General surgery following.   Assessment & Plan:   Principal Problem:   Acute GI bleeding Active Problems:   Bipolar disorder ([place])   Hypothyroid   Essential hypertension, benign   Rectal bleeding   MDD (major depressive disorder), recurrent episode, severe (HCC)   Acute blood loss anemia   Hypokalemia   Chronic wound infection of abdomen   HTN (hypertension)   Absolute anemia   Abdominal wall fistula   Folate deficiency   Non-intractable vomiting with nausea   SBO (small bowel obstruction) ([place])   1. Rectal bleeding.  Suspect is related to hemorrhoids.  Hemoglobin has been relatively stable.  Currently 9.4.  Continue to monitor.  Continue Anusol. 2. Small bowel obstruction.  Noted on CT scan.  General surgery following.  NG tube was initially placed for decompression.  She received a milk of molasses enema yesterday with some results.  She is not had any vomiting.  NG tube was discontinued this morning.  She is been started on sips of clear liquids. 3. Hypokalemia/hypomagnesemia.  Resolved with replacement. 4. Abdominal wounds in the setting of previous abdominal surgeries.  Seen by general surgery and no further antibiotics recommended at this time.  Appears to be chronic.  Recommended to keep wounds clean.  Follow-up with general surgery as an outpatient. 5. Normocytic anemia.  Secondary to iron deficiency versus acute blood loss.  Continue on iron supplementation.  Monitor CBC 6. Depression/anxiety.  Continue on Lexapro   DVT  prophylaxis: SCDs Code Status: Full code Family Communication: No family present Disposition Plan: Discharge home once improved  Consultants:   Gastroenterology  General surgery  Procedures:     Antimicrobials:      Subjective: Had small bowel movement with enema yesterday.  No vomiting.  NG tube discontinued this morning.  Objective: Vitals:   05/19/17 2122 05/19/17 2147 05/20/17 0459 05/20/17 1343  BP: (!) 151/99  (!) 148/93 119/86  Pulse: 69  63 75  Resp: 15  18 18   Temp: 98.5 F (36.9 C)  98.5 F (36.9 C) 98.1 F (36.7 C)  TempSrc: Oral  Oral Oral  SpO2: 98% 97% 97% 98%  Weight:      Height:        Intake/Output Summary (Last 24 hours) at 05/20/2017 1426 Last data filed at 05/20/2017 1300 Gross per 24 hour  Intake 2590 ml  Output 1500 ml  Net 1090 ml   Filed Weights   05/09/17 2120 05/10/17 2353  Weight: 61.7 kg (136 lb) 65.4 kg (144 lb 2.9 oz)    Examination:  General exam: Alert, awake, oriented x 3, NG tube removed Respiratory system: Clear to auscultation. Respiratory effort normal. Cardiovascular system:RRR. No murmurs, rubs, gallops. Gastrointestinal system: Abdomen is nondistended, soft and diffusely tender. No organomegaly or masses felt. Normal bowel sounds heard. Central nervous system: Alert and oriented. No focal neurological deficits. Extremities: No C/C/E, +pedal pulses Skin: wounds on abdominal wall with dressing, no surrounding erythema Psychiatry: Judgement and insight appear normal. Mood & affect appropriate.    Data Reviewed: I have personally reviewed following labs  and imaging studies  CBC: Recent Labs  Lab 05/14/17 0510 05/15/17 0515 05/16/17 0759 05/18/17 0658 05/19/17 0617  WBC 5.9 7.3 5.2 4.9 5.4  HGB 9.7* 8.9* 9.8* 8.6* 9.4*  HCT 30.2* 27.2* 30.4* 25.8* 28.4*  MCV 83.0 81.4 82.6 81.6 80.2  PLT 244 240 236 258 967   Basic Metabolic Panel: Recent Labs  Lab 05/14/17 0510 05/15/17 0515 05/16/17 0759  05/18/17 0658  NA 139 138 135 138  K 3.4* 3.3* 3.8 3.6  CL 106 105 100* 106  CO2 23 23 25 24   GLUCOSE 141* 104* 95 92  BUN 6 7 10 9   CREATININE 0.57 0.64 0.74 0.42*  CALCIUM 9.0 8.7* 9.0 8.5*  MG 1.6*  --   --   --    GFR: Estimated Creatinine Clearance: 77.4 mL/min (A) (by C-G formula based on SCr of 0.42 mg/dL (L)). Liver Function Tests: No results for input(s): AST, ALT, ALKPHOS, BILITOT, PROT, ALBUMIN in the last 168 hours. No results for input(s): LIPASE, AMYLASE in the last 168 hours. No results for input(s): AMMONIA in the last 168 hours. Coagulation Profile: No results for input(s): INR, PROTIME in the last 168 hours. Cardiac Enzymes: No results for input(s): CKTOTAL, CKMB, CKMBINDEX, TROPONINI in the last 168 hours. BNP (last 3 results) No results for input(s): PROBNP in the last 8760 hours. HbA1C: No results for input(s): HGBA1C in the last 72 hours. CBG: No results for input(s): GLUCAP in the last 168 hours. Lipid Profile: No results for input(s): CHOL, HDL, LDLCALC, TRIG, CHOLHDL, LDLDIRECT in the last 72 hours. Thyroid Function Tests: No results for input(s): TSH, T4TOTAL, FREET4, T3FREE, THYROIDAB in the last 72 hours. Anemia Panel: No results for input(s): VITAMINB12, FOLATE, FERRITIN, TIBC, IRON, RETICCTPCT in the last 72 hours. Sepsis Labs: No results for input(s): PROCALCITON, LATICACIDVEN in the last 168 hours.  Recent Results (from the past 240 hour(s))  MRSA PCR Screening     Status: None   Collection Time: 05/11/17 12:10 AM  Result Value Ref Range Status   MRSA by PCR NEGATIVE NEGATIVE Final    Comment:        The GeneXpert MRSA Assay (FDA approved for NASAL specimens only), is one component of a comprehensive MRSA colonization surveillance program. It is not intended to diagnose MRSA infection nor to guide or monitor treatment for MRSA infections. Performed at [facility], [address] [place], [place] [address]   Aerobic Culture  (superficial specimen)     Status: None   Collection Time: 05/16/17 12:40 PM  Result Value Ref Range Status   Specimen Description   Final    ABDOMEN Performed at [facility], [address] [place], [place] [address]    Special Requests   Final    Normal Performed at [facility], [address] [place], [place] [address]    Gram Stain   Final    FEW WBC PRESENT,BOTH PMN AND MONONUCLEAR NO ORGANISMS SEEN Performed at [facility], [place] [address] [place], [place] [address]    Culture FEW STAPHYLOCOCCUS AUREUS  Final   Report Status 05/19/2017 FINAL  Final   Organism ID, Bacteria STAPHYLOCOCCUS AUREUS  Final      Susceptibility   Staphylococcus aureus - MIC*    CIPROFLOXACIN <=0.5 SENSITIVE Sensitive     ERYTHROMYCIN <=0.25 SENSITIVE Sensitive     GENTAMICIN <=0.5 SENSITIVE Sensitive     OXACILLIN <=0.25 SENSITIVE Sensitive     TETRACYCLINE <=1 SENSITIVE Sensitive  VANCOMYCIN 1 SENSITIVE Sensitive     TRIMETH/SULFA <=10 SENSITIVE Sensitive     CLINDAMYCIN <=0.25 SENSITIVE Sensitive     RIFAMPIN <=0.5 SENSITIVE Sensitive     Inducible Clindamycin NEGATIVE Sensitive     * FEW STAPHYLOCOCCUS AUREUS         Radiology Studies: No results found.      Scheduled Meds: . amLODipine  5 mg Oral Daily  . hydrocortisone   Rectal QID  . lisinopril  20 mg Oral Daily  . milk and molasses  1 enema Rectal Once  . pantoprazole (PROTONIX) IV  40 mg Intravenous Q12H  . polyethylene glycol  17 g Oral Daily   Continuous Infusions: . dextrose 5 % and 0.9 % NaCl with KCl 20 mEq/L 50 mL/hr at 05/20/17 1347     LOS: 10 days    Time spent: 25 minutes    Kathie Dike, MD [facility] Pager [phone]  If 7PM-7AM, please contact night-coverage [email or web address] Password [email or web address] 05/20/2017, 2:26 PM

## 2017-05-20 NOTE — Care Management Note (Signed)
Case Management Note  Patient Details  Name: Angel Price MRN: [id number] Date of Birth: 05-Apr-1965  If discussed at Long Length of Stay Meetings, dates discussed:  05/20/17  Additional Comments:  Sherald Barge, RN 05/20/2017, 12:53 PM

## 2017-05-21 LAB — BASIC METABOLIC PANEL
Anion gap: 9 (ref 5–15)
BUN: 9 mg/dL (ref 6–20)
CALCIUM: 8.5 mg/dL — AB (ref 8.9–10.3)
CO2: 22 mmol/L (ref 22–32)
CREATININE: 0.55 mg/dL (ref 0.44–1.00)
Chloride: 108 mmol/L (ref 101–111)
GFR calc Af Amer: >60 mL/min (ref >60)
GFR calc non Af Amer: >60 mL/min (ref >60)
GLUCOSE: 132 mg/dL — AB (ref 65–99)
Potassium: 3.3 mmol/L — ABNORMAL LOW (ref 3.5–5.1)
Sodium: 139 mmol/L (ref 135–145)

## 2017-05-21 LAB — CBC
HCT: 28 % — ABNORMAL LOW (ref 36.0–46.0)
Hemoglobin: 9.4 g/dL — ABNORMAL LOW (ref 12.0–15.0)
MCH: 26.9 pg (ref 26.0–34.0)
MCHC: 33.6 g/dL (ref 30.0–36.0)
MCV: 80 fL (ref 78.0–100.0)
PLATELETS: 334 10*3/uL (ref 150–400)
RBC: 3.5 MIL/uL — ABNORMAL LOW (ref 3.87–5.11)
RDW: 18.9 % — AB (ref 11.5–15.5)
WBC: 6.4 10*3/uL (ref 4.0–10.5)

## 2017-05-21 MED ORDER — MAGNESIUM HYDROXIDE 400 MG/5ML PO SUSP
30.0000 mL | Freq: Three times a day (TID) | ORAL | Status: DC
  Administered 2017-05-21 – 2017-05-23 (×6): 30 mL via ORAL
  Filled 2017-05-21 (×6): qty 30

## 2017-05-21 NOTE — Progress Notes (Signed)
PROGRESS NOTE    Angel Price  WCH:[id number] DOB: 05-26-1965 DOA: 05/09/2017 PCP: Rosita Fire, MD    Brief Narrative:  52 year old female with a history of gastric bypass, chronic periumbilical fistulous, hypertension, presented to the hospital with complaints of rectal bleeding.  Seen by gastroenterology and felt as this was likely hemorrhoidal.  She continues to have nausea and vomiting and CT scan indicated small bowel obstruction.  General surgery following.   Assessment & Plan:   Principal Problem:   Acute GI bleeding Active Problems:   Bipolar disorder ([place])   Hypothyroid   Essential hypertension, benign   Rectal bleeding   MDD (major depressive disorder), recurrent episode, severe (HCC)   Acute blood loss anemia   Hypokalemia   Chronic wound infection of abdomen   HTN (hypertension)   Absolute anemia   Abdominal wall fistula   Folate deficiency   Non-intractable vomiting with nausea   SBO (small bowel obstruction) ([place])   1. Rectal bleeding.  Suspect is related to hemorrhoids.  Hemoglobin has been relatively stable.  Currently 9.4.  Continue to monitor.  Continue Anusol. 2. Small bowel obstruction.  Noted on CT scan.  General surgery following.  NG tube was initially placed for decompression.  She has been receiving some milk and molasses enemas.  NG tube was discontinued on 3/5.  She was started on sips of clear liquids.  She reports continued nausea and intermittent vomiting.  Her vomiting has not been witnessed by staff.  She is been advised that if she has any further episodes of vomiting, she must alert staff so that these may be observed and documented. 3. Hypokalemia/hypomagnesemia.  Resolved with replacement. 4. Abdominal wounds in the setting of previous abdominal surgeries.  Seen by general surgery and no further antibiotics recommended at this time.  Appears to be chronic.  Recommended to keep wounds clean.  Follow-up with general surgery as an  outpatient. 5. Normocytic anemia.  Secondary to iron deficiency versus acute blood loss.  Continue on iron supplementation.  Monitor CBC 6. Depression/anxiety.  Continue on Lexapro   DVT prophylaxis: SCDs Code Status: Full code Family Communication: No family present Disposition Plan: Discharge home once improved  Consultants:   Gastroenterology  General surgery  Procedures:     Antimicrobials:      Subjective: Patient reports that she has had intermittent vomiting.  She had a small bowel movement after receiving an enema yesterday.  She is not passing flatus.  Objective: Vitals:   05/20/17 2257 05/21/17 0350 05/21/17 0859 05/21/17 1313  BP: (!) 137/95 133/87 (!) 126/97 126/85  Pulse: 74 75 86 73  Resp: 18 16  18   Temp: 98.7 F (37.1 C) 99 F (37.2 C)  98.5 F (36.9 C)  TempSrc: Oral Oral  Oral  SpO2: 97% 99%  98%  Weight:      Height:        Intake/Output Summary (Last 24 hours) at 05/21/2017 1734 Last data filed at 05/21/2017 1500 Gross per 24 hour  Intake 360 ml  Output 1900 ml  Net -1540 ml   Filed Weights   05/09/17 2120 05/10/17 2353  Weight: 61.7 kg (136 lb) 65.4 kg (144 lb 2.9 oz)    Examination:  General exam: Alert, awake, oriented x 3 Respiratory system: Clear to auscultation. Respiratory effort normal. Cardiovascular system:RRR. No murmurs, rubs, gallops. Gastrointestinal system: Abdomen is nondistended, soft and diffusely tender. No organomegaly or masses felt. Normal bowel sounds heard. Central nervous system: Alert and oriented.  No focal neurological deficits. Extremities: No C/C/E, +pedal pulses Skin: Wounds on the abdominal wall with dressings, no surrounding erythema Psychiatry: Judgement and insight appear normal. Mood & affect appropriate.   Data Reviewed: I have personally reviewed following labs and imaging studies  CBC: Recent Labs  Lab 05/15/17 0515 05/16/17 0759 05/18/17 0658 05/19/17 0617 05/21/17 0534  WBC 7.3 5.2 4.9  5.4 6.4  HGB 8.9* 9.8* 8.6* 9.4* 9.4*  HCT 27.2* 30.4* 25.8* 28.4* 28.0*  MCV 81.4 82.6 81.6 80.2 80.0  PLT 240 236 258 278 166   Basic Metabolic Panel: Recent Labs  Lab 05/15/17 0515 05/16/17 0759 05/18/17 0658 05/21/17 0534  NA 138 135 138 139  K 3.3* 3.8 3.6 3.3*  CL 105 100* 106 108  CO2 23 25 24 22   GLUCOSE 104* 95 92 132*  BUN 7 10 9 9   CREATININE 0.64 0.74 0.42* 0.55  CALCIUM 8.7* 9.0 8.5* 8.5*   GFR: Estimated Creatinine Clearance: 77.4 mL/min (by C-G formula based on SCr of 0.55 mg/dL). Liver Function Tests: No results for input(s): AST, ALT, ALKPHOS, BILITOT, PROT, ALBUMIN in the last 168 hours. No results for input(s): LIPASE, AMYLASE in the last 168 hours. No results for input(s): AMMONIA in the last 168 hours. Coagulation Profile: No results for input(s): INR, PROTIME in the last 168 hours. Cardiac Enzymes: No results for input(s): CKTOTAL, CKMB, CKMBINDEX, TROPONINI in the last 168 hours. BNP (last 3 results) No results for input(s): PROBNP in the last 8760 hours. HbA1C: No results for input(s): HGBA1C in the last 72 hours. CBG: No results for input(s): GLUCAP in the last 168 hours. Lipid Profile: No results for input(s): CHOL, HDL, LDLCALC, TRIG, CHOLHDL, LDLDIRECT in the last 72 hours. Thyroid Function Tests: No results for input(s): TSH, T4TOTAL, FREET4, T3FREE, THYROIDAB in the last 72 hours. Anemia Panel: No results for input(s): VITAMINB12, FOLATE, FERRITIN, TIBC, IRON, RETICCTPCT in the last 72 hours. Sepsis Labs: No results for input(s): PROCALCITON, LATICACIDVEN in the last 168 hours.  Recent Results (from the past 240 hour(s))  Aerobic Culture (superficial specimen)     Status: None   Collection Time: 05/16/17 12:40 PM  Result Value Ref Range Status   Specimen Description   Final    ABDOMEN Performed at [facility], [address] [place], [place] [address]    Special Requests   Final    Normal Performed at [facility], [address], [place], [place] [address]    Gram Stain   Final    FEW WBC PRESENT,BOTH PMN AND MONONUCLEAR NO ORGANISMS SEEN Performed at [facility], [place] [address], [place], [place] [address]    Culture FEW STAPHYLOCOCCUS AUREUS  Final   Report Status 05/19/2017 FINAL  Final   Organism ID, Bacteria STAPHYLOCOCCUS AUREUS  Final      Susceptibility   Staphylococcus aureus - MIC*    CIPROFLOXACIN <=0.5 SENSITIVE Sensitive     ERYTHROMYCIN <=0.25 SENSITIVE Sensitive     GENTAMICIN <=0.5 SENSITIVE Sensitive     OXACILLIN <=0.25 SENSITIVE Sensitive     TETRACYCLINE <=1 SENSITIVE Sensitive     VANCOMYCIN 1 SENSITIVE Sensitive     TRIMETH/SULFA <=10 SENSITIVE Sensitive     CLINDAMYCIN <=0.25 SENSITIVE Sensitive     RIFAMPIN <=0.5 SENSITIVE Sensitive     Inducible Clindamycin NEGATIVE Sensitive     * FEW STAPHYLOCOCCUS AUREUS         Radiology Studies: No results found.      Scheduled Meds: .  amLODipine  5 mg Oral Daily  . hydrocortisone   Rectal QID  . lisinopril  20 mg Oral Daily  . magnesium hydroxide  30 mL Oral TID  . pantoprazole (PROTONIX) IV  40 mg Intravenous Q12H  . polyethylene glycol  17 g Oral Daily   Continuous Infusions: . dextrose 5 % and 0.9 % NaCl with KCl 20 mEq/L 50 mL/hr at 05/21/17 0859     LOS: 11 days    Time spent: 25 minutes    Kathie Dike, MD [facility] Pager [phone]  If 7PM-7AM, please contact night-coverage [email or web address] Password [facility] 05/21/2017, 5:34 PM

## 2017-05-21 NOTE — Progress Notes (Signed)
10 Days Post-Op  Subjective: Still feels bloated.  She is passing gas.  She states she is nauseated and has had episodes of emesis, but it has not been witnessed.  Objective: Vital signs in last 24 hours: Temp:  [98.1 F (36.7 C)-99 F (37.2 C)] 99 F (37.2 C) (03/06 0350) Pulse Rate:  [74-86] 86 (03/06 0859) Resp:  [16-18] 16 (03/06 0350) BP: (119-137)/(86-97) 126/97 (03/06 0859) SpO2:  [97 %-99 %] 99 % (03/06 0350) Last BM Date: 05/21/17  Intake/Output from previous day: 03/05 0701 - 03/06 0700 In: 240 [P.O.:240] Out: -  Intake/Output this shift: Total I/O In: 240 [P.O.:240] Out: -   General appearance: alert, cooperative and no distress GI: Soft, slightly distended, bowel sounds present.  No rigidity noted.  Lab Results:  Recent Labs    05/19/17 0617 05/21/17 0534  WBC 5.4 6.4  HGB 9.4* 9.4*  HCT 28.4* 28.0*  PLT 278 334   BMET Recent Labs    05/21/17 0534  NA 139  K 3.3*  CL 108  CO2 22  GLUCOSE 132*  BUN 9  CREATININE 0.55  CALCIUM 8.5*   PT/INR No results for input(s): LABPROT, INR in the last 72 hours.  Studies/Results: No results found.  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  Patient has had a slow return of bowel function.  This is not surprising given her extensive history of upper abdominal surgery. Plan: We will continue cathartics at this time.  If patient conditions worsens, NG tube will need to be replaced.  Would replace it with a 16 or 18 [place].  LOS: 11 days    Aviva Signs 05/21/2017

## 2017-05-22 MED ORDER — KETOROLAC TROMETHAMINE 15 MG/ML IJ SOLN
15.0000 mg | Freq: Four times a day (QID) | INTRAMUSCULAR | Status: DC | PRN
  Administered 2017-05-23: 15 mg via INTRAVENOUS
  Filled 2017-05-22: qty 1

## 2017-05-22 MED ORDER — HYDROMORPHONE HCL 1 MG/ML IJ SOLN
0.5000 mg | INTRAMUSCULAR | Status: DC | PRN
  Administered 2017-05-22 – 2017-05-23 (×8): 1 mg via INTRAVENOUS
  Filled 2017-05-22: qty 0.5
  Filled 2017-05-22 (×7): qty 1
  Filled 2017-05-22: qty 0.5

## 2017-05-22 MED ORDER — PROMETHAZINE HCL 25 MG RE SUPP: 12.5000 mg | Freq: Four times a day (QID) | RECTAL | Status: DC | PRN

## 2017-05-22 MED ORDER — HYDROMORPHONE HCL 1 MG/ML IJ SOLN: 0.5000 mg | INTRAMUSCULAR | Status: DC | PRN

## 2017-05-22 MED ORDER — PROMETHAZINE HCL 12.5 MG PO TABS
25.0000 mg | ORAL_TABLET | Freq: Four times a day (QID) | ORAL | Status: DC | PRN
  Administered 2017-05-22 – 2017-05-23 (×4): 25 mg via ORAL
  Filled 2017-05-22 (×4): qty 2

## 2017-05-22 NOTE — Progress Notes (Signed)
PROGRESS NOTE    Angel Price  IRJ:[id number] DOB: 01-11-1966 DOA: 05/09/2017 PCP: Rosita Fire, MD    Brief Narrative:  52 year old female with a history of gastric bypass, chronic periumbilical fistulous, hypertension, presented to the hospital with complaints of rectal bleeding.  Seen by gastroenterology and felt as this was likely hemorrhoidal.  She continues to have nausea and vomiting and CT scan indicated small bowel obstruction.  General surgery following.  Assessment & Plan:   Principal Problem:   Acute GI bleeding Active Problems:   Bipolar disorder ([place])   Hypothyroid   Essential hypertension, benign   Rectal bleeding   MDD (major depressive disorder), recurrent episode, severe (HCC)   Acute blood loss anemia   Hypokalemia   Chronic wound infection of abdomen   HTN (hypertension)   Absolute anemia   Abdominal wall fistula   Folate deficiency   Non-intractable vomiting with nausea   SBO (small bowel obstruction) ([place])   1. Rectal bleeding.  Suspect is related to hemorrhoids.  Hemoglobin has been relatively stable.  Currently 9.4.  Continue to monitor.  Continue Anusol. 2. Small bowel obstruction.  Noted on CT scan.  General surgery following.  NG tube was initially placed for decompression.  She has received some milk and molasses enemas.  NG tube was discontinued on 3/5.  She was started on sips of clear liquids.  She reports continued nausea and intermittent vomiting but overall has been improving.  Her vomiting has not been witnessed by staff.  She is been advised that if she has any further episodes of vomiting, she must alert staff so that these may be observed and documented.  I spoke with general surgery today.  Advancing diet today.  If tolerates could discharge home tomorrow. 3. Hypokalemia/hypomagnesemia.  Resolved with replacement. 4. Abdominal wounds in the setting of previous abdominal surgeries.  Seen by general surgery and no further antibiotics  recommended at this time.  Appears to be chronic.  Recommended to keep wounds clean.  Follow-up with general surgery as an outpatient. 5. Normocytic anemia.  Secondary to iron deficiency versus acute blood loss.  Continue on iron supplementation.  Monitor CBC 6. Depression/anxiety.  Continue on Lexapro.  DVT prophylaxis: SCDs Code Status: Full code Family Communication: No family present Disposition Plan: Home tomorrow if continues to improve and cleared by surgery.  Consultants:   Gastroenterology  General surgery  Subjective: Patient reported that she had some emesis but it was not seen by a nurse.  Her abdominal pain is been intermittent but overall seems to be improving.  She has been having episodes of confusion associated with IV Phenergan and Dilaudid administration.  The IV Phenergan has been discontinued at this time.  Objective: Vitals:   05/21/17 1313 05/21/17 2140 05/22/17 0319 05/22/17 0931  BP: 126/85 (!) 131/91 108/80 127/90  Pulse: 73 89 73   Resp: 18 16 15    Temp: 98.5 F (36.9 C) 97.8 F (36.6 C) 98.5 F (36.9 C)   TempSrc: Oral Oral Oral   SpO2: 98% 100% 97%   Weight:      Height:        Intake/Output Summary (Last 24 hours) at 05/22/2017 1035 Last data filed at 05/22/2017 0600 Gross per 24 hour  Intake 720 ml  Output 1900 ml  Net -1180 ml   Filed Weights   05/09/17 2120 05/10/17 2353  Weight: 61.7 kg (136 lb) 65.4 kg (144 lb 2.9 oz)    Examination:  General exam: Alert, awake,  oriented x 3 Respiratory system: Clear to auscultation. Respiratory effort normal. Cardiovascular system:normal s1, s2 sounds. No murmurs, rubs, gallops. Gastrointestinal system: Abdomen is nondistended, soft and nonspecifically tender. No organomegaly or masses felt. Normal bowel sounds heard. Central nervous system: Alert and oriented. No focal neurological deficits. Extremities: No C/C/E, +pedal pulses Skin: Wounds on the abdominal wall with dressings, no surrounding  erythema Psychiatry: Judgement and insight appear normal. Mood & affect appropriate.   Data Reviewed: I have personally reviewed following labs and imaging studies  CBC: Recent Labs  Lab 05/16/17 0759 05/18/17 0658 05/19/17 0617 05/21/17 0534  WBC 5.2 4.9 5.4 6.4  HGB 9.8* 8.6* 9.4* 9.4*  HCT 30.4* 25.8* 28.4* 28.0*  MCV 82.6 81.6 80.2 80.0  PLT 236 258 278 017   Basic Metabolic Panel: Recent Labs  Lab 05/16/17 0759 05/18/17 0658 05/21/17 0534  NA 135 138 139  K 3.8 3.6 3.3*  CL 100* 106 108  CO2 25 24 22   GLUCOSE 95 92 132*  BUN 10 9 9   CREATININE 0.74 0.42* 0.55  CALCIUM 9.0 8.5* 8.5*   GFR: Estimated Creatinine Clearance: 77.4 mL/min (by C-G formula based on SCr of 0.55 mg/dL). Liver Function Tests: No results for input(s): AST, ALT, ALKPHOS, BILITOT, PROT, ALBUMIN in the last 168 hours. No results for input(s): LIPASE, AMYLASE in the last 168 hours. No results for input(s): AMMONIA in the last 168 hours. Coagulation Profile: No results for input(s): INR, PROTIME in the last 168 hours. Cardiac Enzymes: No results for input(s): CKTOTAL, CKMB, CKMBINDEX, TROPONINI in the last 168 hours. BNP (last 3 results) No results for input(s): PROBNP in the last 8760 hours. HbA1C: No results for input(s): HGBA1C in the last 72 hours. CBG: No results for input(s): GLUCAP in the last 168 hours. Lipid Profile: No results for input(s): CHOL, HDL, LDLCALC, TRIG, CHOLHDL, LDLDIRECT in the last 72 hours. Thyroid Function Tests: No results for input(s): TSH, T4TOTAL, FREET4, T3FREE, THYROIDAB in the last 72 hours. Anemia Panel: No results for input(s): VITAMINB12, FOLATE, FERRITIN, TIBC, IRON, RETICCTPCT in the last 72 hours. Sepsis Labs: No results for input(s): PROCALCITON, LATICACIDVEN in the last 168 hours.  Recent Results (from the past 240 hour(s))  Aerobic Culture (superficial specimen)     Status: None   Collection Time: 05/16/17 12:40 PM  Result Value Ref Range  Status   Specimen Description   Final    ABDOMEN Performed at [facility], [address] [place], [place] [address]    Special Requests   Final    Normal Performed at [facility], [address], [place], [place] [address]    Gram Stain   Final    FEW WBC PRESENT,BOTH PMN AND MONONUCLEAR NO ORGANISMS SEEN Performed at [facility], [place] [address], [place], [place] [address]    Culture FEW STAPHYLOCOCCUS AUREUS  Final   Report Status 05/19/2017 FINAL  Final   Organism ID, Bacteria STAPHYLOCOCCUS AUREUS  Final      Susceptibility   Staphylococcus aureus - MIC*    CIPROFLOXACIN <=0.5 SENSITIVE Sensitive     ERYTHROMYCIN <=0.25 SENSITIVE Sensitive     GENTAMICIN <=0.5 SENSITIVE Sensitive     OXACILLIN <=0.25 SENSITIVE Sensitive     TETRACYCLINE <=1 SENSITIVE Sensitive     VANCOMYCIN 1 SENSITIVE Sensitive     TRIMETH/SULFA <=10 SENSITIVE Sensitive     CLINDAMYCIN <=0.25 SENSITIVE Sensitive     RIFAMPIN <=0.5 SENSITIVE Sensitive     Inducible Clindamycin NEGATIVE Sensitive     *  FEW STAPHYLOCOCCUS AUREUS    Radiology Studies: No results found.   Scheduled Meds: . amLODipine  5 mg Oral Daily  . hydrocortisone   Rectal QID  . lisinopril  20 mg Oral Daily  . magnesium hydroxide  30 mL Oral TID  . pantoprazole (PROTONIX) IV  40 mg Intravenous Q12H  . polyethylene glycol  17 g Oral Daily   Continuous Infusions: . dextrose 5 % and 0.9 % NaCl with KCl 20 mEq/L 50 mL/hr at 05/22/17 0512     LOS: 12 days   Time spent: 25 minutes  Irwin Brakeman, MD [facility] Pager [phone]  If 7PM-7AM, please contact night-coverage [email or web address] Password [email or web address] 05/22/2017, 10:35 AM

## 2017-05-22 NOTE — Progress Notes (Signed)
11 Days Post-Op  Subjective: Patient states that she is slightly better, but continues to have intermittent nausea.  She states she has had emesis, but that has not been fully witnessed by nursing staff.  She is requiring a significant amount of pain medication as well as Phenergan.  She also had an episode of delusion earlier today, trying to place her sequential compression stockings on her head.  Objective: Vital signs in last 24 hours: Temp:  [97.8 F (36.6 C)-98.5 F (36.9 C)] 98.5 F (36.9 C) (03/07 0319) Pulse Rate:  [73-89] 73 (03/07 0319) Resp:  [15-18] 15 (03/07 0319) BP: (108-131)/(80-91) 127/90 (03/07 0931) SpO2:  [97 %-100 %] 97 % (03/07 0319) Last BM Date: 05/21/17  Intake/Output from previous day: 03/06 0701 - 03/07 0700 In: 960 [P.O.:360; I.V.:600] Out: 1900 [Urine:1900] Intake/Output this shift: No intake/output data recorded.  General appearance: alert, cooperative and no distress GI: soft, non-tender; bowel sounds normal; no masses,  no organomegaly  Lab Results:  Recent Labs    05/21/17 0534  WBC 6.4  HGB 9.4*  HCT 28.0*  PLT 334   BMET Recent Labs    05/21/17 0534  NA 139  K 3.3*  CL 108  CO2 22  GLUCOSE 132*  BUN 9  CREATININE 0.55  CALCIUM 8.5*   PT/INR No results for input(s): LABPROT, INR in the last 72 hours.  Studies/Results: No results found.  Anti-infectives: Anti-infectives (From admission, onward)   None      Assessment/Plan: Impression: Clinically, it seems like the patient's small bowel obstruction has resolved.  She is difficult to get an accurate history.  She has exhibited drug-seeking behavior in the past.  We will advance her diet.  No need for acute surgical intervention at this time.  Discussed with Dr. Wynetta Emery.  LOS: 12 days    Aviva Signs 05/22/2017

## 2017-05-22 NOTE — Care Management Note (Signed)
Case Management Note  Patient Details  Name: Angel Price MRN: [id number] Date of Birth: [birth year]  If discussed at [place] Length of Stay Meetings, dates discussed:  05/22/2017  Additional Comments:  Sherald Barge, RN 05/22/2017, 12:22 PM

## 2017-05-23 MED ORDER — PANTOPRAZOLE SODIUM 40 MG PO TBEC
40.0000 mg | DELAYED_RELEASE_TABLET | Freq: Two times a day (BID) | ORAL | Status: DC
  Administered 2017-05-23: 40 mg via ORAL
  Filled 2017-05-23: qty 1

## 2017-05-23 MED ORDER — HYDROCORTISONE 2.5 % RE CREA: TOPICAL_CREAM | Freq: Two times a day (BID) | RECTAL | 1 refills | Status: DC

## 2017-05-23 MED ORDER — PANTOPRAZOLE SODIUM 40 MG PO TBEC: 40.0000 mg | DELAYED_RELEASE_TABLET | Freq: Two times a day (BID) | ORAL | 0 refills | Status: DC

## 2017-05-23 MED ORDER — CYCLOBENZAPRINE HCL 5 MG PO TABS: 5.0000 mg | ORAL_TABLET | Freq: Three times a day (TID) | ORAL | 0 refills | Status: DC | PRN

## 2017-05-23 MED ORDER — ONDANSETRON 4 MG PO TBDP: 4.0000 mg | ORAL_TABLET | Freq: Three times a day (TID) | ORAL | 0 refills | Status: DC | PRN

## 2017-05-23 MED ORDER — POLYETHYLENE GLYCOL 3350 17 G PO PACK: 17.0000 g | PACK | Freq: Two times a day (BID) | ORAL | 0 refills | Status: AC

## 2017-05-23 MED ORDER — OXYCODONE HCL 5 MG PO TABS: 5.0000 mg | ORAL_TABLET | Freq: Four times a day (QID) | ORAL | 0 refills | Status: DC | PRN

## 2017-05-23 NOTE — Progress Notes (Signed)
Midline removed. Area clean, dry and intact. Vaseline gauze dressing applied and pressure held for 5 minutes. Pt educated to keep pressure dressing on for 24 hours.

## 2017-05-23 NOTE — Discharge Instructions (Signed)
Follow with Primary MD  Rosita Fire, MD  and other consultants as instructed your Hospitalist MD  Please get a complete blood count and chemistry panel checked by your Primary MD at your next visit, and again as instructed by your Primary MD.  Get Medicines reviewed and adjusted: Please take all your medications with you for your next visit with your Primary MD  Laboratory/radiological data: Please request your Primary MD to go over all hospital tests and procedure/radiological results at the follow up, please ask your Primary MD to get all Hospital records sent to his/her office.  In some cases, they will be blood work, cultures and biopsy results pending at the time of your discharge. Please request that your primary care M.D. follows up on these results.  Also Note the following: If you experience worsening of your admission symptoms, develop shortness of breath, life threatening emergency, suicidal or homicidal thoughts you must seek medical attention immediately by calling 911 or calling your MD immediately  if symptoms less severe.  You must read complete instructions/literature along with all the possible adverse reactions/side effects for all the Medicines you take and that have been prescribed to you. Take any new Medicines after you have completely understood and accpet all the possible adverse reactions/side effects.   Do not drive when taking Pain medications or sleeping medications (Benzodaizepines)  Do not take more than prescribed Pain, Sleep and Anxiety Medications. It is not advisable to combine anxiety,sleep and pain medications without talking with your primary care practitioner  Special Instructions: If you have smoked or chewed Tobacco  in the last 2 yrs please stop smoking, stop any regular Alcohol  and or any Recreational drug use.  Wear Seat belts while driving.  Please note: You were cared for by a hospitalist during your hospital stay. Once you are discharged,  your primary care physician will handle any further medical issues. Please note that NO REFILLS for any discharge medications will be authorized once you are discharged, as it is imperative that you return to your primary care physician (or establish a relationship with a primary care physician if you do not have one) for your post hospital discharge needs so that they can reassess your need for medications and monitor your lab values.    Constipation, Adult Constipation is when a person has fewer bowel movements in a week than normal, has difficulty having a bowel movement, or has stools that are dry, hard, or larger than normal. Constipation may be caused by an underlying condition. It may become worse with age if a person takes certain medicines and does not take in enough fluids. Follow these instructions at home: Eating and drinking   Eat foods that have a lot of fiber, such as fresh fruits and vegetables, whole grains, and beans.  Limit foods that are high in fat, low in fiber, or overly processed, such as french fries, hamburgers, cookies, candies, and soda.  Drink enough fluid to keep your urine clear or pale yellow. General instructions  Exercise regularly or as told by your health care provider.  Go to the restroom when you have the urge to go. Do not hold it in.  Take over-the-counter and prescription medicines only as told by your health care provider. These include any fiber supplements.  Practice pelvic floor retraining exercises, such as deep breathing while relaxing the lower abdomen and pelvic floor relaxation during bowel movements.  Watch your condition for any changes.  Keep all follow-up visits as told  by your health care provider. This is important. Contact a health care provider if:  You have pain that gets worse.  You have a fever.  You do not have a bowel movement after 4 days.  You vomit.  You are not hungry.  You lose weight.  You are bleeding from  the anus.  You have thin, pencil-like stools. Get help right away if:  You have a fever and your symptoms suddenly get worse.  You leak stool or have blood in your stool.  Your abdomen is bloated.  You have severe pain in your abdomen.  You feel dizzy or you faint. This information is not intended to replace advice given to you by your health care provider. Make sure you discuss any questions you have with your health care provider.   Small Bowel Obstruction A small bowel obstruction means that something is blocking the small bowel. The small bowel is also called the small intestine. It is the long tube that connects the stomach to the colon. An obstruction will stop food and fluids from passing through the small bowel. Treatment depends on what is causing the problem and how bad the problem is. Follow these instructions at home:  Get a lot of rest.  Follow your diet as told by your doctor. You may need to: ? Only drink clear liquids until you start to get better. ? Avoid solid foods as told by your doctor.  Take over-the-counter and prescription medicines only as told by your doctor.  Keep all follow-up visits as told by your doctor. This is important. Contact a doctor if:  You have a fever.  You have chills. Get help right away if:  You have pain or cramps that get worse.  You throw up (vomit) blood.  You have a feeling of being sick to your stomach (nausea) that does not go away.  You cannot stop throwing up.  You cannot drink fluids.  You feel confused.  You feel dry or thirsty (dehydrated).  Your belly gets more bloated.  You feel weak or you pass out (faint). This information is not intended to replace advice given to you by your health care provider. Make sure you discuss any questions you have with your health care provider. Document Released: 04/11/2004 Document Revised: 10/30/2015 Document Reviewed: 04/28/2014 Elsevier Interactive Patient Education   2018 [employer] Released: 12/01/2003 Document Revised: 09/22/2015 Document Reviewed: 08/23/2015 Elsevier Interactive Patient Education  2018 [employer].

## 2017-05-23 NOTE — Care Management Important Message (Signed)
Important Message  Patient Details  Name: Angel Price MRN: [id number] Date of Birth: Jun 27, 1965   Medicare Important Message Given:  Yes    Sherald Barge, RN 05/23/2017, 11:20 AM

## 2017-05-23 NOTE — Discharge Summary (Signed)
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 None   Collection Time: 05/16/17 12:40 PM  Result Value Ref Range Status   Specimen Description   Final    ABDOMEN Performed at [facility], [address] [place], [place] [address]    Special Requests   Final    Normal Performed at [facility], [address] [place], [place] [address]    Gram Stain   Final    FEW WBC PRESENT,BOTH PMN AND MONONUCLEAR NO ORGANISMS SEEN Performed at [facility], [place] [address], [place], [place] [address]    Culture FEW STAPHYLOCOCCUS AUREUS  Final   Report Status 05/19/2017 FINAL  Final   Organism ID, Bacteria STAPHYLOCOCCUS AUREUS  Final      Susceptibility   Staphylococcus aureus - MIC*    CIPROFLOXACIN <=0.5 SENSITIVE Sensitive     ERYTHROMYCIN <=0.25 SENSITIVE Sensitive     GENTAMICIN <=0.5 SENSITIVE Sensitive     OXACILLIN <=0.25 SENSITIVE Sensitive     TETRACYCLINE <=1 SENSITIVE Sensitive     VANCOMYCIN 1 SENSITIVE Sensitive      TRIMETH/SULFA <=10 SENSITIVE Sensitive     CLINDAMYCIN <=0.25 SENSITIVE Sensitive     RIFAMPIN <=0.5 SENSITIVE Sensitive     Inducible Clindamycin NEGATIVE Sensitive     * FEW STAPHYLOCOCCUS AUREUS     Labs: BNP (last 3 results) No results for input(s): BNP in the last 8760 hours. Basic Metabolic Panel: Recent Labs  Lab 05/18/17 0658 05/21/17  0534  NA 138 139  K 3.6 3.3*  CL 106 108  CO2 24 22  GLUCOSE 92 132*  BUN 9 9  CREATININE 0.42* 0.55  CALCIUM 8.5* 8.5*   Liver Function Tests: No results for input(s): AST, ALT, ALKPHOS, BILITOT, PROT, ALBUMIN in the last 168 hours. No results for input(s): LIPASE, AMYLASE in the last 168 hours. No results for input(s): AMMONIA in the last 168 hours. CBC: Recent Labs  Lab 05/18/17 0658 05/19/17 0617 05/21/17 0534  WBC 4.9 5.4 6.4  HGB 8.6* 9.4* 9.4*  HCT 25.8* 28.4* 28.0*  MCV 81.6 80.2 80.0  PLT 258 278 334   Cardiac Enzymes: No results for input(s): CKTOTAL, CKMB, CKMBINDEX, TROPONINI in the last 168 hours. BNP: Invalid input(s): POCBNP CBG: No results for input(s): GLUCAP in the last 168 hours. D-Dimer No results for input(s): DDIMER in the last 72 hours. Hgb A1c No results for input(s): HGBA1C in the last 72 hours. Lipid Profile No results for input(s): CHOL, HDL, LDLCALC, TRIG, CHOLHDL, LDLDIRECT in the last 72 hours. Thyroid function studies No results for input(s): TSH, T4TOTAL, T3FREE, THYROIDAB in the last 72 hours.  Invalid input(s): FREET3 Anemia work up No results for input(s): VITAMINB12, FOLATE, FERRITIN, TIBC, IRON, RETICCTPCT in the last 72 hours. Urinalysis    Component Value Date/Time   COLORURINE STRAW (A) 01/04/2017 1859   APPEARANCEUR CLEAR 01/04/2017 1859   LABSPEC 1.042 (H) 01/04/2017 1859   PHURINE 6.0 01/04/2017 1859   GLUCOSEU NEGATIVE 01/04/2017 1859   HGBUR NEGATIVE 01/04/2017 1859   BILIRUBINUR NEGATIVE 01/04/2017 1859   KETONESUR NEGATIVE 01/04/2017 1859   PROTEINUR NEGATIVE  01/04/2017 1859   UROBILINOGEN 0.2 11/26/2014 0915   NITRITE NEGATIVE 01/04/2017 1859   LEUKOCYTESUR NEGATIVE 01/04/2017 1859   Sepsis Labs Invalid input(s): PROCALCITONIN,  WBC,  LACTICIDVEN Microbiology Recent Results (from the past 240 hour(s))  Aerobic Culture (superficial specimen)     Status: None   Collection Time: 05/16/17 12:40 PM  Result Value Ref Range Status   Specimen Description   Final    ABDOMEN Performed at [facility], [address] [place], [place] [address]    Special Requests   Final    Normal Performed at [facility], [address] [place], [place] [address]    Gram Stain   Final    FEW WBC PRESENT,BOTH PMN AND MONONUCLEAR NO ORGANISMS SEEN Performed at [facility], [place] [address], [place], [place] [address]    Culture FEW STAPHYLOCOCCUS AUREUS  Final   Report Status 05/19/2017 FINAL  Final   Organism ID, Bacteria STAPHYLOCOCCUS AUREUS  Final      Susceptibility   Staphylococcus aureus - MIC*    CIPROFLOXACIN <=0.5 SENSITIVE Sensitive     ERYTHROMYCIN <=0.25 SENSITIVE Sensitive     GENTAMICIN <=0.5 SENSITIVE Sensitive     OXACILLIN <=0.25 SENSITIVE Sensitive     TETRACYCLINE <=1 SENSITIVE Sensitive     VANCOMYCIN 1 SENSITIVE Sensitive     TRIMETH/SULFA <=10 SENSITIVE Sensitive     CLINDAMYCIN <=0.25 SENSITIVE Sensitive     RIFAMPIN <=0.5 SENSITIVE Sensitive     Inducible Clindamycin NEGATIVE Sensitive     * FEW STAPHYLOCOCCUS AUREUS    Time coordinating discharge: 43 minutes  SIGNED:  Irwin Brakeman, MD  [facility] 05/23/2017, 10:52 AM Pager [phone]  If 7PM-7AM, please contact night-coverage [email or web address] Password [email or web address]

## 2017-05-23 NOTE — Progress Notes (Signed)
Discharge instructions gone over with patient, verbalized understanding. Patient understands to keep pressure dressing from midline removal on for 24 hours.

## 2017-05-23 NOTE — Progress Notes (Signed)
12 Days Post-Op  Subjective: Patient's abdominal pain has resolved.  No emesis noted.  Objective: Vital signs in last 24 hours: Temp:  [97.9 F (36.6 C)-98.5 F (36.9 C)] 98.2 F (36.8 C) (03/08 0500) Pulse Rate:  [60-92] 92 (03/08 0500) Resp:  [16] 16 (03/08 0500) BP: (100-133)/(73-89) 100/73 (03/08 0500) SpO2:  [98 %-100 %] 98 % (03/08 0500) Last BM Date: 05/22/17  Intake/Output from previous day: 03/07 0701 - 03/08 0700 In: 1270 [P.O.:720; I.V.:550] Out: 1350 [Urine:1350] Intake/Output this shift: No intake/output data recorded.  General appearance: alert, cooperative and no distress GI: Soft, nondistended.  No rigidity noted.  No change from yesterday.  Bowel sounds present.  Lab Results:  Recent Labs    05/21/17 0534  WBC 6.4  HGB 9.4*  HCT 28.0*  PLT 334   BMET Recent Labs    05/21/17 0534  NA 139  K 3.3*  CL 108  CO2 22  GLUCOSE 132*  BUN 9  CREATININE 0.55  CALCIUM 8.5*   PT/INR No results for input(s): LABPROT, INR in the last 72 hours.  Studies/Results: No results found.  Anti-infectives: Anti-infectives (From admission, onward)   None      Assessment/Plan: Impression: Small bowel obstruction resolving, may also have a component of bowel dysmotility.  Nothing further to be added from the surgical standpoint.  Okay for discharge from surgery standpoint.  LOS: 13 days    Aviva Signs 05/23/2017

## 2017-06-05 ENCOUNTER — Ambulatory Visit (INDEPENDENT_AMBULATORY_CARE_PROVIDER_SITE_OTHER): Payer: Medicare Other | Admitting: General Surgery

## 2017-06-05 ENCOUNTER — Encounter: Payer: Self-pay | Admitting: General Surgery

## 2017-06-05 VITALS — BP 181/118 | HR 121 | Temp 97.7°F | Resp 18 | Ht 63.0 in | Wt 133.0 lb

## 2017-06-05 DIAGNOSIS — K641 Second degree hemorrhoids: Secondary | ICD-10-CM

## 2017-06-05 DIAGNOSIS — Z09 Encounter for follow-up examination after completed treatment for conditions other than malignant neoplasm: Secondary | ICD-10-CM

## 2017-06-05 DIAGNOSIS — K3184 Gastroparesis: Secondary | ICD-10-CM | POA: Diagnosis not present

## 2017-06-05 DIAGNOSIS — D649 Anemia, unspecified: Secondary | ICD-10-CM | POA: Diagnosis not present

## 2017-06-05 DIAGNOSIS — L930 Discoid lupus erythematosus: Secondary | ICD-10-CM | POA: Diagnosis not present

## 2017-06-05 NOTE — Progress Notes (Signed)
[facility] History and Physical  Reason for Referral: Hospital Follow up   Chief Complaint    Follow-up      Angel Price is a 52 y.o. female.  HPI: Ms. Korber is a 52 yo who is known to the practice and has a history of chronic abdominal pain and draining sinuses from the abdominal wall after multiple abdominal surgeries and Mesh placement at an OSH. Dr. Arnoldo Morale has opened up the tracks in the past when an infection occurs, and has even referred her to plastic surgery who felt that they could not offer her anything due to her noncompliance with care.  She has also suffered from SBO recently in the hospital after being hospitalized for BRBPR.  She was having bloody BMs but no other stool and ultimately was found to have a SBO on CT scan.  During that hospitalization, she did get better and was able to be managed non operatively for her SBO.  She does continue to have BRBPR and was scoped June 2018 by Dr. Oneida Alar and was noted to have large internal hemorrhoids at that time.  An EGD was done during the hospitalization and no bleeding was identified.    She was discharged home and is following up for the hemorrhoids and the bleeding from her rectum. She understands that her abdominal wall and her SBO are complex issues and that nonoperative management of these is the best plan given the number of procedures and the risk of creating worse problems with more abdominal procedures.   Past Medical History:  Diagnosis Date  . Abscess    soft tissue  . Adrenal mass ([place])   . Alcohol abuse   . Anxiety   . Blood transfusion without reported diagnosis   . Chronic abdominal pain   . Chronic wound infection of abdomen   . Colon polyp    colonoscopy 04/2014  . Depression   . Diverticulosis    colonoscopy 04/2014 moderat pan colonic  . Gastritis    EGD 05/2014  . Gastroparesis Nov 2015  . GERD (gastroesophageal reflux disease)   . Hemorrhoid    internal large  . Hiatal  hernia   . History of Billroth II operation   . Hypertension   . Lung nodule    CT 02/2014 needs repeat 1 month  . Lung nodule < 6cm on CT 04/25/2014  . Lupus   . Nausea and vomiting    chronic, recurrent  . Pancreatitis   . Schatzki's ring    patent per EGD 04/2014  . Sickle cell trait ([place])   . Suicide attempt ([place])   . 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 ABDOMINAL HYSTERECTOMY  2013   [place]  . ABDOMINAL SURGERY    . ADRENALECTOMY Right   . AGILE CAPSULE N/A 01/05/2015   Procedure: AGILE CAPSULE;  Surgeon: Daneil Dolin, MD;  Location: [facility];  Service: Endoscopy;  Laterality: N/A;  0700  . Billroth II procedure      [place], first 2000, 2005/2006.  Marland Kitchen BIOPSY  05/20/2013   Procedure: BIOPSIES OF ASCENDING AND SIGMOID COLON;  Surgeon: Daneil Dolin, MD;  Location: [facility];  Service: Endoscopy;;  . BIOPSY  04/26/2014   Procedure: BIOPSIES;  Surgeon: Danie Binder, MD;  Location: [facility];  Service: Endoscopy;;  . CHOLECYSTECTOMY    . COLONOSCOPY     in [place]  . COLONOSCOPY WITH  PROPOFOL N/A 05/20/2013   Dr.Rourk- inadequate prep, normal appearing rectum, grossly normal colon aside from pancolonic diverticula, normal terminal ileum bx= unremarkable colonic mucosa. Due for early interval 2016.   Marland Kitchen COLONOSCOPY WITH PROPOFOL N/A 04/26/2014   YSA:YTKZSW ileum/one colon polyp removed/moderate pan-colonic diverticulosis/large internal hemorrhoids  . COLONOSCOPY WITH PROPOFOL N/A 12/23/2014   Dr.Rourk- minimal internal hemorrhoids, pancolonic diverticulosis  . COLONOSCOPY WITH PROPOFOL N/A 08/23/2016   Procedure: COLONOSCOPY WITH PROPOFOL;  Surgeon: Danie Binder, MD;  [facility];  Service: Endoscopy;  Laterality: N/A;  . DEBRIDEMENT OF ABDOMINAL WALL ABSCESS N/A 02/08/2013   Procedure: DEBRIDEMENT OF ABDOMINAL WALL ABSCESS;  Surgeon: Jamesetta So, MD;  Location: [facility];  Service: General;   Laterality: N/A;  . ESOPHAGOGASTRODUODENOSCOPY (EGD) WITH PROPOFOL N/A 05/20/2013   Dr.Rourk- s/p prior gastric surgery with normal esophagus, residual gastric mucosa and patent efferent limb  . ESOPHAGOGASTRODUODENOSCOPY (EGD) WITH PROPOFOL N/A 02/03/2014   Dr. Gala Romney:  s/p hemigastrectomy with retained gastric contents. Residual gastric mucosa and efferent limb appeared normal otherwise. Query gastroparesis.   Marland Kitchen ESOPHAGOGASTRODUODENOSCOPY (EGD) WITH PROPOFOL N/A 04/26/2014   FUX:[id number] ring/small HH/mild non-erosive gasrtitis/normal anastomosis  . ESOPHAGOGASTRODUODENOSCOPY (EGD) WITH PROPOFOL N/A 12/23/2014   Dr.Rourk- s/p prior hemigastrctomy, active oozing from anastomotic suture site, hemostasis achieved  . ESOPHAGOGASTRODUODENOSCOPY (EGD) WITH PROPOFOL N/A 05/23/2016   Dr. 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 ESOPHAGOGASTRODUODENOSCOPY (EGD) WITH PROPOFOL N/A 05/11/2017   Procedure: ESOPHAGOGASTRODUODENOSCOPY (EGD) WITH PROPOFOL;  Surgeon: Daneil Dolin, MD;  [facility];  Service: Endoscopy;  Laterality: N/A;  . INCISION AND DRAINAGE ABSCESS N/A 01/06/2017   Procedure: INCISION AND DRAINAGE ABDOMINAL WALL ABSCESS;  Surgeon: Aviva Signs, MD;  Location: [facility];  Service: General;  Laterality: N/A;  . tendon repar Right    wrist  . WOUND EXPLORATION Right 06/24/2013   Procedure: exploration of traumatic wound right wrist;  Surgeon: Tennis Must, MD;  Location: [place];  Service: Orthopedics;  Laterality: Right;    Family History  Problem Relation Age of Onset  . Brain cancer Son   . Schizophrenia Son   . Cancer Son        brain  . Lung cancer Father   . Cancer Father        mets  . Drug abuse Mother   . Breast cancer Maternal Aunt   . Bipolar disorder Maternal Aunt   . Drug abuse Maternal Aunt   . Colon cancer Maternal Grandmother        late 12s, early 82s  . Drug  abuse Sister   . Drug abuse Brother   . Bipolar disorder Paternal Grandfather   . Bipolar disorder Cousin   . Liver disease Neg Hx     Social History   Tobacco Use  . Smoking status: Current Every Day Smoker    Packs/day: 0.50    Years: 35.00    Pack years: 17.50    Types: Cigarettes  . Smokeless tobacco: Never Used  Substance Use Topics  . Alcohol use: No    Alcohol/week: 0.0 oz    Comment: 1 beer 4 days ago  . Drug use: No    Types: Cocaine    Comment: quit 09/2012    Medications: I have reviewed the patient's current medications. Prior to Admission:  (Not in a hospital admission) Allergies as of 06/05/2017      Reactions  Codeine Hives   Morphine And Related Hives   Reglan [metoclopramide] Other (See Comments)   EPS symptoms       Medication List        Accurate as of 06/05/17 11:59 PM. Always use your most recent med list.          amLODipine 10 MG tablet Commonly known as:  NORVASC Take 0.5 tablets (5 mg total) by mouth daily.   B-12 2500 MCG Tabs Take 5,000 mcg by mouth daily.   cyclobenzaprine 5 MG tablet Commonly known as:  FLEXERIL Take 1 tablet (5 mg total) by mouth 3 (three) times daily as needed for muscle spasms.   escitalopram 20 MG tablet Commonly known as:  LEXAPRO Take 20 mg daily by mouth.   fluticasone 27.5 MCG/SPRAY nasal spray Commonly known as:  VERAMYST Place 2 sprays daily as needed into the nose.   hydrocortisone 2.5 % rectal cream Commonly known as:  ANUSOL-HC Place rectally 2 (two) times daily.   lisinopril 20 MG tablet Commonly known as:  PRINIVIL,ZESTRIL Take 1 tablet (20 mg total) by mouth daily.   ondansetron 4 MG disintegrating tablet Commonly known as:  ZOFRAN ODT Take 1 tablet (4 mg total) by mouth every 8 (eight) hours as needed for nausea or vomiting.   oxyCODONE 5 MG immediate release tablet Commonly known as:  Oxy IR/ROXICODONE Take 1 tablet (5 mg total) by mouth every 6 (six) hours as needed for severe  pain.   pantoprazole 40 MG tablet Commonly known as:  PROTONIX Take 1 tablet (40 mg total) by mouth 2 (two) times daily.   polyethylene glycol packet Commonly known as:  MIRALAX / GLYCOLAX Take 17 g by mouth 2 (two) times daily.   zolpidem 5 MG tablet Commonly known as:  AMBIEN Take 5 mg by mouth at bedtime as needed for sleep.        ROS:  A comprehensive review of systems was negative except for: Cardiovascular: positive for high blood pressure  Gastrointestinal: positive for abdominal pain and draining sinus tracks, chronic; bleeding from her rectum  Blood pressure (!) 181/118, pulse (!) 121, temperature 97.7 F (36.5 C), resp. rate 18, height 5\' 3"  (1.6 m), weight 133 lb (60.3 kg). Repeated 169/121 and HR 112  Physical Exam  Constitutional: She is oriented to person, place, and time and well-developed, well-nourished, and in no distress.  HENT:  Head: Normocephalic.  Eyes: Pupils are equal, round, and reactive to light.  Neck: Normal range of motion.  Cardiovascular: Regular rhythm.  Tachycardic, and hypertensive  Pulmonary/Chest: Effort normal and breath sounds normal.  Abdominal: Soft. She exhibits no distension. There is tenderness.  Chronic draining sinuses on abdominal wall, thin yellow drainage, no erythema  Genitourinary:  Genitourinary Comments: Minimal external hemorrhoid tissue, no gross bleeding, normal DRE with normal tone, some fullness likely from internal hemorrhoids   Musculoskeletal: Normal range of motion.  Neurological: She is alert and oriented to person, place, and time.  Skin: Skin is warm and dry.  Psychiatric: Mood, memory, affect and judgment normal.  Vitals reviewed.   Results: None   Assessment & Plan:  DAUN RENS is a 52 y.o. female with multiple issues including chronic draining sinus on her abdominal wall that are best managed with local wound care due to the extent of surgeries and risk of worsening her problems and creating  possible fistulas in the future. These sinuses seem to be from the remaining mesh based on prior documentation.  In  addition, she is having BRBPR and would like to get her hemorrhoid taken care of at this time.  However, during her visit her BP and HR were elevated. She does not take a beta blocker, and reports that she missed her dose of AM BP meds.    -Patient to go see PCP ASAP and get meds adjusted for her BP/ HR control; informed of the risk of elevated BP including stroke   -Hemorrhoid surgery for hemorrhoids is very painful. She is currently only complaining of bleeding and no pain or discomfort.  Post operatively, she will have severe pain for about 2-3 weeks. The patient will have feelings of constant pressure and pain in the area from the swelling and removal of the anoderm (skin around the anus). The internal hemorrhoids are not painful to remove because the same nerves are not involved, and the sensation is different, but removal of any external hemorrhoids/ anal skin will cause significant discomfort. They will need at least 4-6 weeks to recover from the surgery, and should not expect to be able to feel back to "normal for 6-8 weeks."    The risk of hemorrhoid surgery include bleeding, risk of infection although rare, and the risk of narrowing the anal canal if too much tissue is removed. Given this risk, it is likely that only the 2 largest hemorrhoid columns would be removed during the initial surgery.  We have also discussed the risk of incontinence after surgery if the muscles were injured, and although this is rare that it can happen and is another reason to limit the amount of hemorrhoids removed.    -After she goes to see the PCP, she will call regarding surgery.   All questions were answered to the satisfaction of the patient.   Virl Cagey 06/08/2017, 1:11 PM

## 2017-06-09 ENCOUNTER — Emergency Department (HOSPITAL_COMMUNITY): Payer: Medicare Other

## 2017-06-09 ENCOUNTER — Other Ambulatory Visit: Payer: Self-pay

## 2017-06-09 ENCOUNTER — Emergency Department (HOSPITAL_COMMUNITY)
Admission: EM | Admit: 2017-06-09 | Discharge: 2017-06-09 | Disposition: A | Discharge status: Home / Self Care | Payer: Medicare Other | Attending: Emergency Medicine | Admitting: Emergency Medicine

## 2017-06-09 ENCOUNTER — Encounter (HOSPITAL_COMMUNITY): Payer: Self-pay | Admitting: Emergency Medicine

## 2017-06-09 DIAGNOSIS — I10 Essential (primary) hypertension: Secondary | ICD-10-CM | POA: Insufficient documentation

## 2017-06-09 DIAGNOSIS — F17210 Nicotine dependence, cigarettes, uncomplicated: Secondary | ICD-10-CM | POA: Insufficient documentation

## 2017-06-09 DIAGNOSIS — E039 Hypothyroidism, unspecified: Secondary | ICD-10-CM | POA: Diagnosis not present

## 2017-06-09 DIAGNOSIS — R1084 Generalized abdominal pain: Secondary | ICD-10-CM | POA: Diagnosis not present

## 2017-06-09 DIAGNOSIS — R109 Unspecified abdominal pain: Secondary | ICD-10-CM | POA: Diagnosis not present

## 2017-06-09 DIAGNOSIS — Z79899 Other long term (current) drug therapy: Secondary | ICD-10-CM | POA: Diagnosis not present

## 2017-06-09 DIAGNOSIS — R1010 Upper abdominal pain, unspecified: Secondary | ICD-10-CM | POA: Diagnosis not present

## 2017-06-09 DIAGNOSIS — R197 Diarrhea, unspecified: Secondary | ICD-10-CM | POA: Diagnosis not present

## 2017-06-09 DIAGNOSIS — R112 Nausea with vomiting, unspecified: Secondary | ICD-10-CM | POA: Diagnosis not present

## 2017-06-09 LAB — CBC WITH DIFFERENTIAL/PLATELET
BASOS ABS: 0 10*3/uL (ref 0.0–0.1)
Basophils Relative: 0 %
EOS ABS: 0 10*3/uL (ref 0.0–0.7)
Eosinophils Relative: 0 %
HCT: 37.8 % (ref 36.0–46.0)
Hemoglobin: 12.6 g/dL (ref 12.0–15.0)
LYMPHS ABS: 1.2 10*3/uL (ref 0.7–4.0)
LYMPHS PCT: 14 %
MCH: 26.1 pg (ref 26.0–34.0)
MCHC: 33.3 g/dL (ref 30.0–36.0)
MCV: 78.4 fL (ref 78.0–100.0)
Monocytes Absolute: 0.3 10*3/uL (ref 0.1–1.0)
Monocytes Relative: 4 %
Neutro Abs: 7.1 10*3/uL (ref 1.7–7.7)
Neutrophils Relative %: 82 %
Platelets: 356 10*3/uL (ref 150–400)
RBC: 4.82 MIL/uL (ref 3.87–5.11)
RDW: 17.9 % — ABNORMAL HIGH (ref 11.5–15.5)
WBC: 8.8 10*3/uL (ref 4.0–10.5)

## 2017-06-09 LAB — COMPREHENSIVE METABOLIC PANEL
ALT: 33 U/L (ref 14–54)
ANION GAP: 14 (ref 5–15)
AST: 81 U/L — ABNORMAL HIGH (ref 15–41)
Albumin: 4 g/dL (ref 3.5–5.0)
Alkaline Phosphatase: 124 U/L (ref 38–126)
BUN: 8 mg/dL (ref 6–20)
CHLORIDE: 96 mmol/L — AB (ref 101–111)
CO2: 18 mmol/L — ABNORMAL LOW (ref 22–32)
Calcium: 9 mg/dL (ref 8.9–10.3)
Creatinine, Ser: 0.69 mg/dL (ref 0.44–1.00)
Glucose, Bld: 109 mg/dL — ABNORMAL HIGH (ref 65–99)
POTASSIUM: 3.3 mmol/L — AB (ref 3.5–5.1)
Sodium: 128 mmol/L — ABNORMAL LOW (ref 135–145)
Total Bilirubin: 1.4 mg/dL — ABNORMAL HIGH (ref 0.3–1.2)
Total Protein: 8.1 g/dL (ref 6.5–8.1)

## 2017-06-09 LAB — URINALYSIS, ROUTINE W REFLEX MICROSCOPIC
Bilirubin Urine: NEGATIVE
Glucose, UA: NEGATIVE mg/dL
Hgb urine dipstick: NEGATIVE
Ketones, ur: 20 mg/dL — AB
Leukocytes, UA: NEGATIVE
NITRITE: NEGATIVE
Protein, ur: NEGATIVE mg/dL
SPECIFIC GRAVITY, URINE: 1.029 (ref 1.005–1.030)
pH: 6 (ref 5.0–8.0)

## 2017-06-09 LAB — LIPASE, BLOOD: LIPASE: 19 U/L (ref 11–51)

## 2017-06-09 MED ORDER — HYDROMORPHONE HCL 1 MG/ML IJ SOLN
1.0000 mg | Freq: Once | INTRAMUSCULAR | Status: AC
  Administered 2017-06-09: 1 mg via INTRAVENOUS
  Filled 2017-06-09: qty 1

## 2017-06-09 MED ORDER — IOPAMIDOL (ISOVUE-300) INJECTION 61%
100.0000 mL | Freq: Once | INTRAVENOUS | Status: AC | PRN
  Administered 2017-06-09: 100 mL via INTRAVENOUS

## 2017-06-09 MED ORDER — OXYCODONE-ACETAMINOPHEN 5-325 MG PO TABS: 1.0000 | ORAL_TABLET | ORAL | 0 refills | Status: DC | PRN

## 2017-06-09 MED ORDER — ONDANSETRON HCL 4 MG/2ML IJ SOLN
4.0000 mg | Freq: Once | INTRAMUSCULAR | Status: AC
  Administered 2017-06-09: 4 mg via INTRAVENOUS
  Filled 2017-06-09: qty 2

## 2017-06-09 MED ORDER — ONDANSETRON HCL 8 MG PO TABS: 8.0000 mg | ORAL_TABLET | ORAL | 0 refills | Status: DC | PRN

## 2017-06-09 MED ORDER — SODIUM CHLORIDE 0.9 % IV BOLUS
1000.0000 mL | Freq: Once | INTRAVENOUS | Status: AC
  Administered 2017-06-09: 1000 mL via INTRAVENOUS

## 2017-06-09 NOTE — ED Provider Notes (Signed)
[facility] Provider Note   CSN: [id number] Arrival date & time: 06/09/17  1534     History   Chief Complaint Chief Complaint  Patient presents with  . Abdominal Pain    HPI Angel Price is a 52 y.o. female.  Patient complains of generalized mid abdominal pain since Saturday with associated nausea, vomiting, diarrhea.  Patient has a history of previous abdominal surgery and small bowel obstructions.  She sees the local gastroenterology group.  She has multiple medical problems well documented in her past medical history.  She is ambulatory.  Verity of symptoms is moderate.  Nothing makes symptoms better or worse     Past Medical History:  Diagnosis Date  . Abscess    soft tissue  . Adrenal mass ([place])   . Alcohol abuse   . Anxiety   . Blood transfusion without reported diagnosis   . Chronic abdominal pain   . Chronic wound infection of abdomen   . Colon polyp    colonoscopy 04/2014  . Depression   . Diverticulosis    colonoscopy 04/2014 moderat pan colonic  . Gastritis    EGD 05/2014  . Gastroparesis Nov 2015  . GERD (gastroesophageal reflux disease)   . Hemorrhoid    internal large  . Hiatal hernia   . History of Billroth II operation   . Hypertension   . Lung nodule    CT 02/2014 needs repeat 1 month  . Lung nodule < 6cm on CT 04/25/2014  . Lupus   . Nausea and vomiting    chronic, recurrent  . Pancreatitis   . Schatzki's ring    patent per EGD 04/2014  . Sickle cell trait ([place])   . Suicide attempt ([place])   . 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 SBO (small bowel obstruction) ([place])   . Non-intractable vomiting with nausea   . Folate deficiency 05/11/2017  . Abdominal wall fistula   . Cellulitis of abdominal wall 01/02/2017  . Acute left hemiparesis ([place]) 12/05/2016  . Malingering 12/05/2016  . Weakness of left lower extremity 12/02/2016  . CVA (cerebral vascular  accident) ([place]) 11/23/2016  . Bright red rectal bleeding 08/22/2016  . Hypotension due to blood loss   . Acute GI bleeding 05/24/2016  . Dieulafoy lesion of duodenum   . History of Billroth II operation   . Gastrointestinal hemorrhage 05/22/2016  . Absolute anemia   . Acute pancreatitis 10/01/2015  . Abnormal CT scan of lung 10/01/2015  . Alcohol intoxication ([place])   . Left-sided weakness   . Psychosomatic factor in physical condition   . Upper GI bleed   . Diverticulosis of colon with hemorrhage   . Chronic wound infection of abdomen 12/22/2014  . Thyroid disease 12/22/2014  . HTN (hypertension) 12/22/2014  . Ataxia 11/01/2014  . Hemorrhoids 04/26/2014  . Lung nodule 04/26/2014  . Diverticulosis   . Gastritis   . Hiatal hernia   . Schatzki's ring   . Acute blood loss anemia 04/25/2014  . Sinus tachycardia 04/25/2014  . Hypokalemia 04/25/2014  . Hematemesis with nausea 04/25/2014  . Lung nodule < 6cm on CT 04/25/2014  . Intractable nausea and vomiting 04/24/2014  . Gastroparesis   . Abdominal pain 04/01/2014  . Chronic abdominal pain 01/21/2014  . Gastroenteritis 12/10/2013  . Chronic abdominal wound infection 08/16/2013  . MDD (major depressive disorder), recurrent episode, severe ([place]) 06/27/2013  .  Wrist laceration 06/24/2013  . Diarrhea 05/07/2013  . Rectal bleeding 05/07/2013  . Abnormal LFTs 05/07/2013  . Adrenal mass, left ([place]) 05/07/2013  . Abdominal wall abscess at site of surgical wound 04/19/2013  . Abdominal wall abscess 04/18/2013  . Frequent headaches 03/30/2013  . Sleep difficulties 03/30/2013  . Essential hypertension, benign 03/30/2013  . History of cocaine abuse 03/16/2013  . History of schizoaffective disorder 03/16/2013  . Bipolar disorder ([place]) 03/16/2013  . Personality disorder ([place]) 03/16/2013  . Tobacco abuse 03/16/2013  . Alcohol abuse 03/16/2013  . Palpitations 03/16/2013  . Poor vision 03/16/2013  . History of gastric bypass  03/16/2013  . Status post hysterectomy with oophorectomy 03/16/2013  . Hypothyroid 03/16/2013  . Lupus ([place]) 03/16/2013    Past Surgical History:  Procedure Laterality Date  . ABDOMINAL HYSTERECTOMY  2013   [place]  . ABDOMINAL SURGERY    . ADRENALECTOMY Right   . AGILE CAPSULE N/A 01/05/2015   Procedure: AGILE CAPSULE;  Surgeon: Daneil Dolin, MD;  [facility];  Service: Endoscopy;  Laterality: N/A;  0700  . Billroth II procedure      [place], first 2000, 2005/2006.  Marland Kitchen BIOPSY  05/20/2013   Procedure: BIOPSIES OF ASCENDING AND SIGMOID COLON;  Surgeon: Daneil Dolin, MD;  Location: [facility];  Service: Endoscopy;;  . BIOPSY  04/26/2014   Procedure: BIOPSIES;  Surgeon: Danie Binder, MD;  Location: [facility];  Service: Endoscopy;;  . CHOLECYSTECTOMY    . COLONOSCOPY     in [place]  . COLONOSCOPY WITH PROPOFOL N/A 05/20/2013   Dr.Rourk- inadequate prep, normal appearing rectum, grossly normal colon aside from pancolonic diverticula, normal terminal ileum bx= unremarkable colonic mucosa. Due for early interval 2016.   Marland Kitchen COLONOSCOPY WITH PROPOFOL N/A 04/26/2014   PZW:CHENID ileum/one colon polyp removed/moderate pan-colonic diverticulosis/large internal hemorrhoids  . COLONOSCOPY WITH PROPOFOL N/A 12/23/2014   Dr.Rourk- minimal internal hemorrhoids, pancolonic diverticulosis  . COLONOSCOPY WITH PROPOFOL N/A 08/23/2016   Procedure: COLONOSCOPY WITH PROPOFOL;  Surgeon: Danie Binder, MD;  [facility];  Service: Endoscopy;  Laterality: N/A;  . DEBRIDEMENT OF ABDOMINAL WALL ABSCESS N/A 02/08/2013   Procedure: DEBRIDEMENT OF ABDOMINAL WALL ABSCESS;  Surgeon: Jamesetta So, MD;  Location: [facility];  Service: General;  Laterality: N/A;  . ESOPHAGOGASTRODUODENOSCOPY (EGD) WITH PROPOFOL N/A 05/20/2013   Dr.Rourk- s/p prior gastric surgery with normal esophagus, residual gastric mucosa and patent efferent limb  . ESOPHAGOGASTRODUODENOSCOPY (EGD) WITH PROPOFOL N/A 02/03/2014   Dr.  Gala Romney:  s/p hemigastrectomy with retained gastric contents. Residual gastric mucosa and efferent limb appeared normal otherwise. Query gastroparesis.   Marland Kitchen ESOPHAGOGASTRODUODENOSCOPY (EGD) WITH PROPOFOL N/A 04/26/2014   POE:UMPNTIRW'E ring/small HH/mild non-erosive gasrtitis/normal anastomosis  . ESOPHAGOGASTRODUODENOSCOPY (EGD) WITH PROPOFOL N/A 12/23/2014   Dr.Rourk- s/p prior hemigastrctomy, active oozing from anastomotic suture site, hemostasis achieved  . ESOPHAGOGASTRODUODENOSCOPY (EGD) WITH PROPOFOL N/A 05/23/2016   Dr. 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 ESOPHAGOGASTRODUODENOSCOPY (EGD) WITH PROPOFOL N/A 05/11/2017   Procedure: ESOPHAGOGASTRODUODENOSCOPY (EGD) WITH PROPOFOL;  Surgeon: Daneil Dolin, MD;  [facility];  Service: Endoscopy;  Laterality: N/A;  . INCISION AND DRAINAGE ABSCESS N/A 01/06/2017   Procedure: INCISION AND DRAINAGE ABDOMINAL WALL ABSCESS;  Surgeon: Aviva Signs, MD;  Location: [facility];  Service: General;  Laterality: N/A;  . tendon repar Right    wrist  . WOUND EXPLORATION Right 06/24/2013  Procedure: exploration of traumatic wound right wrist;  Surgeon: Tennis Must, MD;  Location: [facility];  Service: Orthopedics;  Laterality: Right;     OB History    Gravida  4   Para  4   Term  4   Preterm      AB      Living  3     SAB      TAB      Ectopic      Multiple      Live Births               Home Medications    Prior to Admission medications   Medication Sig Start Date End Date Taking? Authorizing Provider  amLODipine (NORVASC) 10 MG tablet Take 0.5 tablets (5 mg total) by mouth daily. 01/03/17  Yes Patrecia Pour, Christean Grief, MD  escitalopram (LEXAPRO) 20 MG tablet Take 20 mg daily by mouth.   Yes [provider]  hydrocortisone (ANUSOL-HC) 2.5 % rectal cream Place rectally 2 (two) times daily. 05/23/17  Yes Johnson, Clanford L, MD    lisinopril (PRINIVIL,ZESTRIL) 20 MG tablet Take 1 tablet (20 mg total) by mouth daily. 11/26/16 11/26/17 Yes Reyne Dumas, MD  ondansetron (ZOFRAN ODT) 4 MG disintegrating tablet Take 1 tablet (4 mg total) by mouth every 8 (eight) hours as needed for nausea or vomiting. 05/23/17  Yes Johnson, Clanford L, MD  pantoprazole (PROTONIX) 40 MG tablet Take 1 tablet (40 mg total) by mouth 2 (two) times daily. 05/23/17 06/22/17 Yes Johnson, Clanford L, MD  cyclobenzaprine (FLEXERIL) 5 MG tablet Take 1 tablet (5 mg total) by mouth 3 (three) times daily as needed for muscle spasms. Patient not taking: Reported on 06/09/2017 05/23/17   Irwin Brakeman L, MD  fluticasone (VERAMYST) 27.5 MCG/SPRAY nasal spray Place 2 sprays daily as needed into the nose.    [provider]  ondansetron (ZOFRAN) 8 MG tablet Take 1 tablet (8 mg total) by mouth every 4 (four) hours as needed. 06/09/17   [name], MD  oxyCODONE (OXY IR/ROXICODONE) 5 MG immediate release tablet Take 1 tablet (5 mg total) by mouth every 6 (six) hours as needed for severe pain. Patient not taking: Reported on 06/09/2017 05/23/17   Murlean Iba, MD  oxyCODONE-acetaminophen (PERCOCET) 5-325 MG tablet Take 1 tablet by mouth every 4 (four) hours as needed. 06/09/17   [name], MD  polyethylene glycol [facility] / Floria Raveling) packet Take 17 g by mouth 2 (two) times daily. Patient not taking: Reported on 06/09/2017 05/23/17 06/22/17  Murlean Iba, MD    Family History Family History  Problem Relation Age of Onset  . Brain cancer Son   . Schizophrenia Son   . Cancer Son        brain  . Lung cancer Father   . Cancer Father        mets  . Drug abuse Mother   . Breast cancer Maternal Aunt   . Bipolar disorder Maternal Aunt   . Drug abuse Maternal Aunt   . Colon cancer Maternal Grandmother        late 66s, early 64s  . Drug abuse Sister   . Drug abuse Brother   . Bipolar disorder Paternal Grandfather   . Bipolar disorder Cousin   . Liver  disease Neg Hx     Social History Social History   Tobacco Use  . Smoking status: Current Every Day Smoker    Packs/day: 0.50  Years: 35.00    Pack years: 17.50    Types: Cigarettes  . Smokeless tobacco: Never Used  Substance Use Topics  . Alcohol use: No    Alcohol/week: 0.0 oz    Comment: 1 beer 4 days ago  . Drug use: No    Types: Cocaine    Comment: quit 09/2012     Allergies   Codeine; Morphine and related; and Reglan [metoclopramide]   Review of Systems Review of Systems  All other systems reviewed and are negative.    Physical Exam Updated Vital Signs BP 93/79   Pulse (!) 103   Temp 99.2 F (37.3 C) (Oral)   Resp 18   Ht 5\' 3"  (1.6 m)   Wt 60.3 kg (133 lb)   SpO2 97%   BMI 23.56 kg/m   Physical Exam  Constitutional: She is oriented to person, place, and time.  nad  HENT:  Head: Normocephalic and atraumatic.  Eyes: Conjunctivae are normal.  Neck: Neck supple.  Cardiovascular: Normal rate and regular rhythm.  Pulmonary/Chest: Effort normal and breath sounds normal.  Abdominal: Soft. Bowel sounds are normal.  Previous vertical abdominal scarring  Musculoskeletal: Normal range of motion.  Neurological: She is alert and oriented to person, place, and time.  Skin: Skin is warm and dry.  Psychiatric: She has a normal mood and affect. Her behavior is normal.  Nursing note and vitals reviewed.    ED Treatments / Results  Labs (all labs ordered are listed, but only abnormal results are displayed) Labs Reviewed  COMPREHENSIVE METABOLIC PANEL - Abnormal; Notable for the following components:      Result Value   Sodium 128 (*)    Potassium 3.3 (*)    Chloride 96 (*)    CO2 18 (*)    Glucose, Bld 109 (*)    AST 81 (*)    Total Bilirubin 1.4 (*)    All other components within normal limits  URINALYSIS, ROUTINE W REFLEX MICROSCOPIC - Abnormal; Notable for the following components:   Ketones, ur 20 (*)    All other components within normal  limits  CBC WITH DIFFERENTIAL/PLATELET - Abnormal; Notable for the following components:   RDW 17.9 (*)    All other components within normal limits  LIPASE, BLOOD    EKG None  Radiology Ct Abdomen Pelvis W Contrast  Result Date: 06/09/2017 CLINICAL DATA:  Upper abdominal pain EXAM: CT ABDOMEN AND PELVIS WITH CONTRAST TECHNIQUE: Multidetector CT imaging of the abdomen and pelvis was performed using the standard protocol following bolus administration of intravenous contrast. CONTRAST:  11mL ISOVUE-300 IOPAMIDOL (ISOVUE-300) INJECTION 61% COMPARISON:  05/16/2017 FINDINGS: Lower chest: Lung bases are clear. No effusions. Heart is normal size. Hepatobiliary: Prior cholecystectomy. Mild fatty infiltration of the liver. No focal hepatic abnormality. Mild intrahepatic and extrahepatic biliary ductal dilatation, stable. Pancreas: No focal abnormality or ductal dilatation. Spleen: No focal abnormality.  Normal size. Adrenals/Urinary Tract: Surgical clips around the region of the right adrenal gland and right kidney, likely related to prior right adrenalectomy. Left adrenal nodule measures 1.9 cm, stable. No renal mass or hydronephrosis. 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 No evidence of bowel obstruction. Vascular/Lymphatic: Aortic and iliac calcifications. No evidence of aneurysm or adenopathy. Reproductive: Prior hysterectomy.  No adnexal masses. Other: No free fluid or free air. Soft tissue thickening and possible fluid collections again noted in the anterior abdominal wall, extending to the skin surface to the left and right of the umbilicus, stable since prior study.  Musculoskeletal: No acute bony abnormality. IMPRESSION: Stable soft tissue thickening and fluid collection in the anterior abdominal wall with small fistulous tracts to the left and right of the umbilicus. This is unchanged since prior study. Stable mild fatty infiltration of the liver. Stable intrahepatic and  extrahepatic biliary ductal dilatation. Stable left adrenal adenoma. No evidence of bowel obstruction. Aortic atherosclerosis. Electronically Signed   By: Rolm Baptise M.D.   On: 06/09/2017 17:49    Procedures Procedures (including critical care time)  Medications Ordered in ED Medications  sodium chloride 0.9 % bolus 1,000 mL (1,000 mLs Intravenous Given 06/09/17 1619)  ondansetron (ZOFRAN) injection 4 mg (4 mg Intravenous Given 06/09/17 1619)  HYDROmorphone (DILAUDID) injection 1 mg (1 mg Intravenous Given 06/09/17 1619)  iopamidol (ISOVUE-300) 61 % injection 100 mL (100 mLs Intravenous Contrast Given 06/09/17 1723)  HYDROmorphone (DILAUDID) injection 1 mg (1 mg Intravenous Given 06/09/17 1917)  ondansetron (ZOFRAN) injection 4 mg (4 mg Intravenous Given 06/09/17 1917)     Initial Impression / Assessment and Plan / ED Course  I have reviewed the triage vital signs and the nursing notes.  Pertinent labs & imaging results that were available during my care of the patient were reviewed by me and considered in my medical decision making (see chart for details).     Patient complains of abdominal pain.  White count and liver functions normal.  There are some ketones in her urine.  CT scan shows no acute findings and no major change from her last scan on 05/16/17.  She feels better after IV fluids and pain management.  Discharge medications Percocet and Zofran 8 mg.  She has gastroenterology follow-up.  Final Clinical Impressions(s) / ED Diagnoses   Final diagnoses:  Abdominal pain, unspecified abdominal location    ED Discharge Orders        Ordered    oxyCODONE-acetaminophen (PERCOCET) 5-325 MG tablet  Every 4 hours PRN     06/09/17 2007    ondansetron (ZOFRAN) 8 MG tablet  Every 4 hours PRN     06/09/17 2007       [name], MD 06/09/17 2015

## 2017-06-09 NOTE — Discharge Instructions (Addendum)
Tests showed no life-threatening condition.  Meds for pain and nausea.  Follow-up with your gastroenterologist.

## 2017-06-09 NOTE — ED Notes (Signed)
Pt informed of need for urine sample. Pt states she cannot provide one at this time but was informed to let nursing staff know when able to provide one

## 2017-06-09 NOTE — ED Triage Notes (Signed)
Pt brought in by EMS for abd pain/n/v/d x 2 days. Pt arrived nad. Mm wet.

## 2017-06-11 NOTE — H&P (Signed)
[facility] History and Physical  Reason for Referral: Hospital Follow up      Chief Complaint    Follow-up      Angel Price is a 52 y.o. female.  HPI: Angel Price is a 52 yo who is known to the practice and has a history of chronic abdominal pain and draining sinuses from the abdominal wall after multiple abdominal surgeries and Mesh placement at an OSH. Dr. Arnoldo Morale has opened up the tracks in the past when an infection occurs, and has even referred her to plastic surgery who felt that they could not offer her anything due to her noncompliance with care.  She has also suffered from SBO recently in the hospital after being hospitalized for BRBPR.  She was having bloody BMs but no other stool and ultimately was found to have a SBO on CT scan.  During that hospitalization, she did get better and was able to be managed non operatively for her SBO.  She does continue to have BRBPR and was scoped June 2018 by Dr. Oneida Alar and was noted to have large internal hemorrhoids at that time.  An EGD was done during the hospitalization and no bleeding was identified.    She was discharged home and is following up for the hemorrhoids and the bleeding from her rectum. She understands that her abdominal wall and her SBO are complex issues and that nonoperative management of these is the best plan given the number of procedures and the risk of creating worse problems with more abdominal procedures.       Past Medical History:  Diagnosis Date  . Abscess    soft tissue  . Adrenal mass ([place])   . Alcohol abuse   . Anxiety   . Blood transfusion without reported diagnosis   . Chronic abdominal pain   . Chronic wound infection of abdomen   . Colon polyp    colonoscopy 04/2014  . Depression   . Diverticulosis    colonoscopy 04/2014 moderat pan colonic  . Gastritis    EGD 05/2014  . Gastroparesis Nov 2015  . GERD (gastroesophageal reflux disease)   .  Hemorrhoid    internal large  . Hiatal hernia   . History of Billroth II operation   . Hypertension   . Lung nodule    CT 02/2014 needs repeat 1 month  . Lung nodule < 6cm on CT 04/25/2014  . Lupus   . Nausea and vomiting    chronic, recurrent  . Pancreatitis   . Schatzki's ring    patent per EGD 04/2014  . Sickle cell trait ([place])   . Suicide attempt ([place])   . Thyroid disease 2000   overactive, radiation         Past Surgical History:  Procedure Laterality Date  . ABDOMINAL HYSTERECTOMY  2013   [place]  . ABDOMINAL SURGERY    . ADRENALECTOMY Right   . AGILE CAPSULE N/A 01/05/2015   Procedure: AGILE CAPSULE;  Surgeon: Daneil Dolin, MD;  Location: [facility];  Service: Endoscopy;  Laterality: N/A;  0700  . Billroth II procedure      [place], first 2000, 2005/2006.  Marland Kitchen BIOPSY  05/20/2013   Procedure: BIOPSIES OF ASCENDING AND SIGMOID COLON;  Surgeon: Daneil Dolin, MD;  Location: [facility];  Service: Endoscopy;;  . BIOPSY  04/26/2014   Procedure: BIOPSIES;  Surgeon: Danie Binder, MD;  Location: [facility];  Service: Endoscopy;;  . CHOLECYSTECTOMY    .  COLONOSCOPY     in [place]  . COLONOSCOPY WITH PROPOFOL N/A 05/20/2013   Dr.Rourk- inadequate prep, normal appearing rectum, grossly normal colon aside from pancolonic diverticula, normal terminal ileum bx= unremarkable colonic mucosa. Due for early interval 2016.   Marland Kitchen COLONOSCOPY WITH PROPOFOL N/A 04/26/2014   MWN:UUVOZD ileum/one colon polyp removed/moderate pan-colonic diverticulosis/large internal hemorrhoids  . COLONOSCOPY WITH PROPOFOL N/A 12/23/2014   Dr.Rourk- minimal internal hemorrhoids, pancolonic diverticulosis  . COLONOSCOPY WITH PROPOFOL N/A 08/23/2016   Procedure: COLONOSCOPY WITH PROPOFOL;  Surgeon: Danie Binder, MD;  [facility];  Service: Endoscopy;  Laterality: N/A;  . DEBRIDEMENT OF ABDOMINAL WALL ABSCESS N/A 02/08/2013   Procedure: DEBRIDEMENT OF ABDOMINAL WALL  ABSCESS;  Surgeon: Jamesetta So, MD;  Location: [facility];  Service: General;  Laterality: N/A;  . ESOPHAGOGASTRODUODENOSCOPY (EGD) WITH PROPOFOL N/A 05/20/2013   Dr.Rourk- s/p prior gastric surgery with normal esophagus, residual gastric mucosa and patent efferent limb  . ESOPHAGOGASTRODUODENOSCOPY (EGD) WITH PROPOFOL N/A 02/03/2014   Dr. Gala Romney:  s/p hemigastrectomy with retained gastric contents. Residual gastric mucosa and efferent limb appeared normal otherwise. Query gastroparesis.   Marland Kitchen ESOPHAGOGASTRODUODENOSCOPY (EGD) WITH PROPOFOL N/A 04/26/2014   GUY:[id number] ring/small HH/mild non-erosive gasrtitis/normal anastomosis  . ESOPHAGOGASTRODUODENOSCOPY (EGD) WITH PROPOFOL N/A 12/23/2014   Dr.Rourk- s/p prior hemigastrctomy, active oozing from anastomotic suture site, hemostasis achieved  . ESOPHAGOGASTRODUODENOSCOPY (EGD) WITH PROPOFOL N/A 05/23/2016   Dr. 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 ESOPHAGOGASTRODUODENOSCOPY (EGD) WITH PROPOFOL N/A 05/11/2017   Procedure: ESOPHAGOGASTRODUODENOSCOPY (EGD) WITH PROPOFOL;  Surgeon: Daneil Dolin, MD;  [facility];  Service: Endoscopy;  Laterality: N/A;  . INCISION AND DRAINAGE ABSCESS N/A 01/06/2017   Procedure: INCISION AND DRAINAGE ABDOMINAL WALL ABSCESS;  Surgeon: Aviva Signs, MD;  Location: [facility];  Service: General;  Laterality: N/A;  . tendon repar Right    wrist  . WOUND EXPLORATION Right 06/24/2013   Procedure: exploration of traumatic wound right wrist;  Surgeon: Tennis Must, MD;  Location: [place];  Service: Orthopedics;  Laterality: Right;         Family History  Problem Relation Age of Onset  . Brain cancer Son   . Schizophrenia Son   . Cancer Son        brain  . Lung cancer Father   . Cancer Father        mets  . Drug abuse Mother   . Breast cancer Maternal Aunt   . Bipolar disorder Maternal Aunt   .  Drug abuse Maternal Aunt   . Colon cancer Maternal Grandmother        late 44s, early 54s  . Drug abuse Sister   . Drug abuse Brother   . Bipolar disorder Paternal Grandfather   . Bipolar disorder Cousin   . Liver disease Neg Hx     Social History        Tobacco Use  . Smoking status: Current Every Day Smoker    Packs/day: 0.50    Years: 35.00    Pack years: 17.50    Types: Cigarettes  . Smokeless tobacco: Never Used  Substance Use Topics  . Alcohol use: No    Alcohol/week: 0.0 oz    Comment: 1 beer 4 days ago  . Drug use: No    Types: Cocaine    Comment: quit 09/2012    Medications: I have reviewed the patient's  current medications. Prior to Admission:  (Not in a hospital admission)      Allergies as of 06/05/2017      Reactions   Codeine Hives   Morphine And Related Hives   Reglan [metoclopramide] Other (See Comments)   EPS symptoms                Medication List            Accurate as of 06/05/17 11:59 PM. Always use your most recent med list.           amLODipine 10 MG tablet Commonly known as:  NORVASC Take 0.5 tablets (5 mg total) by mouth daily.   B-12 2500 MCG Tabs Take 5,000 mcg by mouth daily.   cyclobenzaprine 5 MG tablet Commonly known as:  FLEXERIL Take 1 tablet (5 mg total) by mouth 3 (three) times daily as needed for muscle spasms.   escitalopram 20 MG tablet Commonly known as:  LEXAPRO Take 20 mg daily by mouth.   fluticasone 27.5 MCG/SPRAY nasal spray Commonly known as:  VERAMYST Place 2 sprays daily as needed into the nose.   hydrocortisone 2.5 % rectal cream Commonly known as:  ANUSOL-HC Place rectally 2 (two) times daily.   lisinopril 20 MG tablet Commonly known as:  PRINIVIL,ZESTRIL Take 1 tablet (20 mg total) by mouth daily.   ondansetron 4 MG disintegrating tablet Commonly known as:  ZOFRAN ODT Take 1 tablet (4 mg total) by mouth every 8 (eight) hours as needed for  nausea or vomiting.   oxyCODONE 5 MG immediate release tablet Commonly known as:  Oxy IR/ROXICODONE Take 1 tablet (5 mg total) by mouth every 6 (six) hours as needed for severe pain.   pantoprazole 40 MG tablet Commonly known as:  PROTONIX Take 1 tablet (40 mg total) by mouth 2 (two) times daily.   polyethylene glycol packet Commonly known as:  MIRALAX / GLYCOLAX Take 17 g by mouth 2 (two) times daily.   zolpidem 5 MG tablet Commonly known as:  AMBIEN Take 5 mg by mouth at bedtime as needed for sleep.        ROS:  A comprehensive review of systems was negative except for: Cardiovascular: positive for high blood pressure  Gastrointestinal: positive for abdominal pain and draining sinus tracks, chronic; bleeding from her rectum  Blood pressure (!) 181/118, pulse (!) 121, temperature 97.7 F (36.5 C), resp. rate 18, height 5\' 3"  (1.6 m), weight 133 lb (60.3 kg). Repeated 169/121 and HR 112  Physical Exam  Constitutional: She is oriented to person, place, and time and well-developed, well-nourished, and in no distress.  HENT:  Head: Normocephalic.  Eyes: Pupils are equal, round, and reactive to light.  Neck: Normal range of motion.  Cardiovascular: Regular rhythm.  Tachycardic, and hypertensive  Pulmonary/Chest: Effort normal and breath sounds normal.  Abdominal: Soft. She exhibits no distension. There is tenderness.  Chronic draining sinuses on abdominal wall, thin yellow drainage, no erythema  Genitourinary:  Genitourinary Comments: Minimal external hemorrhoid tissue, no gross bleeding, normal DRE with normal tone, some fullness likely from internal hemorrhoids   Musculoskeletal: Normal range of motion.  Neurological: She is alert and oriented to person, place, and time.  Skin: Skin is warm and dry.  Psychiatric: Mood, memory, affect and judgment normal.  Vitals reviewed.   Results: None   Assessment & Plan:  NAZARET CHEA is a 52 y.o. female with  multiple issues including chronic draining sinus on her abdominal wall  that are best managed with local wound care due to the extent of surgeries and risk of worsening her problems and creating possible fistulas in the future. These sinuses seem to be from the remaining mesh based on prior documentation.  In addition, she is having BRBPR and would like to get her hemorrhoid taken care of at this time.  However, during her visit her BP and HR were elevated. She does not take a beta blocker, and reports that she missed her dose of AM BP meds.    -Patient to go see PCP ASAP and get meds adjusted for her BP/ HR control; informed of the risk of elevated BP including stroke   -Hemorrhoid surgery for hemorrhoids is very painful. She is currently only complaining of bleeding and no pain or discomfort.  Post operatively, she will have severe pain for about 2-3 weeks. The patient will have feelings of constant pressure and pain in the area from the swelling and removal of the anoderm (skin around the anus). The internal hemorrhoids are not painful to remove because the same nerves are not involved, and the sensation is different, but removal of any external hemorrhoids/ anal skin will cause significant discomfort. They will need at least 4-6 weeks to recover from the surgery, and should not expect to be able to feel back to "normal for 6-8 weeks."    The risk of hemorrhoid surgery include bleeding, risk of infection although rare, and the risk of narrowing the anal canal if too much tissue is removed. Given this risk, it is likely that only the 2 largest hemorrhoid columns would be removed during the initial surgery.  We have also discussed the risk of incontinence after surgery if the muscles were injured, and although this is rare that it can happen and is another reason to limit the amount of hemorrhoids removed.    -After she goes to see the PCP, she will call regarding surgery.   All questions were  answered to the satisfaction of the patient.   Virl Cagey 06/08/2017, 1:11 PM   Patient's PCP saw her and started her on Atenolol and increased in other medication.  Have scheduled for surgery.   06/11/2017, 11:25 AM

## 2017-06-13 ENCOUNTER — Encounter (HOSPITAL_COMMUNITY)
Admission: RE | Admit: 2017-06-13 | Discharge: 2017-06-13 | Disposition: A | Discharge status: Home / Self Care | Payer: Medicare Other | Source: Ambulatory Visit | Attending: General Surgery | Admitting: General Surgery

## 2017-06-13 ENCOUNTER — Encounter (HOSPITAL_COMMUNITY): Payer: Self-pay

## 2017-06-18 ENCOUNTER — Ambulatory Visit (HOSPITAL_COMMUNITY)
Admission: RE | Admit: 2017-06-18 | Discharge: 2017-06-18 | Disposition: A | Discharge status: Home / Self Care | Payer: Medicare Other | Source: Ambulatory Visit | Attending: General Surgery | Admitting: General Surgery

## 2017-06-18 ENCOUNTER — Ambulatory Visit (HOSPITAL_COMMUNITY): Payer: Medicare Other | Admitting: Anesthesiology

## 2017-06-18 ENCOUNTER — Encounter (HOSPITAL_COMMUNITY)
Admission: RE | Disposition: A | Discharge status: Home / Self Care | Payer: Self-pay | Source: Ambulatory Visit | Attending: General Surgery

## 2017-06-18 ENCOUNTER — Encounter (HOSPITAL_COMMUNITY): Payer: Self-pay

## 2017-06-18 DIAGNOSIS — Z9119 Patient's noncompliance with other medical treatment and regimen: Secondary | ICD-10-CM | POA: Diagnosis not present

## 2017-06-18 DIAGNOSIS — I10 Essential (primary) hypertension: Secondary | ICD-10-CM | POA: Insufficient documentation

## 2017-06-18 DIAGNOSIS — K641 Second degree hemorrhoids: Secondary | ICD-10-CM | POA: Insufficient documentation

## 2017-06-18 DIAGNOSIS — F319 Bipolar disorder, unspecified: Secondary | ICD-10-CM | POA: Insufficient documentation

## 2017-06-18 DIAGNOSIS — K644 Residual hemorrhoidal skin tags: Secondary | ICD-10-CM | POA: Diagnosis not present

## 2017-06-18 DIAGNOSIS — Z79899 Other long term (current) drug therapy: Secondary | ICD-10-CM | POA: Diagnosis not present

## 2017-06-18 DIAGNOSIS — K649 Unspecified hemorrhoids: Secondary | ICD-10-CM | POA: Diagnosis present

## 2017-06-18 DIAGNOSIS — Z888 Allergy status to other drugs, medicaments and biological substances status: Secondary | ICD-10-CM | POA: Insufficient documentation

## 2017-06-18 DIAGNOSIS — F17210 Nicotine dependence, cigarettes, uncomplicated: Secondary | ICD-10-CM | POA: Diagnosis not present

## 2017-06-18 DIAGNOSIS — Z8673 Personal history of transient ischemic attack (TIA), and cerebral infarction without residual deficits: Secondary | ICD-10-CM | POA: Insufficient documentation

## 2017-06-18 DIAGNOSIS — G8929 Other chronic pain: Secondary | ICD-10-CM | POA: Diagnosis not present

## 2017-06-18 DIAGNOSIS — Z885 Allergy status to narcotic agent status: Secondary | ICD-10-CM | POA: Insufficient documentation

## 2017-06-18 DIAGNOSIS — D573 Sickle-cell trait: Secondary | ICD-10-CM | POA: Diagnosis not present

## 2017-06-18 DIAGNOSIS — K648 Other hemorrhoids: Secondary | ICD-10-CM | POA: Diagnosis not present

## 2017-06-18 DIAGNOSIS — R109 Unspecified abdominal pain: Secondary | ICD-10-CM | POA: Insufficient documentation

## 2017-06-18 DIAGNOSIS — M329 Systemic lupus erythematosus, unspecified: Secondary | ICD-10-CM | POA: Insufficient documentation

## 2017-06-18 DIAGNOSIS — F419 Anxiety disorder, unspecified: Secondary | ICD-10-CM | POA: Insufficient documentation

## 2017-06-18 DIAGNOSIS — K625 Hemorrhage of anus and rectum: Secondary | ICD-10-CM | POA: Diagnosis not present

## 2017-06-18 DIAGNOSIS — K219 Gastro-esophageal reflux disease without esophagitis: Secondary | ICD-10-CM | POA: Diagnosis not present

## 2017-06-18 DIAGNOSIS — Z923 Personal history of irradiation: Secondary | ICD-10-CM | POA: Insufficient documentation

## 2017-06-18 HISTORY — PX: HEMORRHOID SURGERY: SHX153

## 2017-06-18 SURGERY — HEMORRHOIDECTOMY
Anesthesia: General | Site: Rectum

## 2017-06-18 MED ORDER — DOCUSATE SODIUM 100 MG PO CAPS: 100.0000 mg | ORAL_CAPSULE | Freq: Two times a day (BID) | ORAL | 2 refills | Status: AC

## 2017-06-18 MED ORDER — ONDANSETRON HCL 4 MG/2ML IJ SOLN
INTRAMUSCULAR | Status: DC | PRN
  Administered 2017-06-18: 4 mg via INTRAVENOUS

## 2017-06-18 MED ORDER — DIPHENHYDRAMINE HCL 50 MG/ML IJ SOLN
25.0000 mg | INTRAMUSCULAR | Status: AC | PRN
  Administered 2017-06-18 (×2): 25 mg via INTRAVENOUS

## 2017-06-18 MED ORDER — OXYCODONE HCL 5 MG PO TABS
5.0000 mg | ORAL_TABLET | Freq: Once | ORAL | Status: AC | PRN
  Administered 2017-06-18: 5 mg via ORAL
  Filled 2017-06-18: qty 1

## 2017-06-18 MED ORDER — CHLORHEXIDINE GLUCONATE CLOTH 2 % EX PADS: 6.0000 | MEDICATED_PAD | Freq: Once | CUTANEOUS | Status: DC

## 2017-06-18 MED ORDER — LIDOCAINE VISCOUS 2 % MT SOLN
OROMUCOSAL | Status: AC
  Filled 2017-06-18: qty 15

## 2017-06-18 MED ORDER — SODIUM CHLORIDE 0.9 % IR SOLN
Status: DC | PRN
  Administered 2017-06-18: 1000 mL

## 2017-06-18 MED ORDER — LIDOCAINE VISCOUS 2 % MT SOLN
OROMUCOSAL | Status: DC | PRN
  Administered 2017-06-18: 1 via OROMUCOSAL

## 2017-06-18 MED ORDER — DEXAMETHASONE SODIUM PHOSPHATE 4 MG/ML IJ SOLN
4.0000 mg | Freq: Once | INTRAMUSCULAR | Status: AC
  Administered 2017-06-18: 4 mg via INTRAVENOUS

## 2017-06-18 MED ORDER — PROPOFOL 10 MG/ML IV BOLUS
INTRAVENOUS | Status: AC
  Filled 2017-06-18: qty 40

## 2017-06-18 MED ORDER — ROCURONIUM BROMIDE 100 MG/10ML IV SOLN
INTRAVENOUS | Status: DC | PRN
  Administered 2017-06-18: 25 mg via INTRAVENOUS
  Administered 2017-06-18: 5 mg via INTRAVENOUS

## 2017-06-18 MED ORDER — PROPOFOL 10 MG/ML IV BOLUS
INTRAVENOUS | Status: DC | PRN
  Administered 2017-06-18: 50 mg via INTRAVENOUS
  Administered 2017-06-18: 150 mg via INTRAVENOUS

## 2017-06-18 MED ORDER — LACTATED RINGERS IV SOLN
INTRAVENOUS | Status: DC
  Administered 2017-06-18: 07:00:00 via INTRAVENOUS

## 2017-06-18 MED ORDER — DIPHENHYDRAMINE HCL 50 MG/ML IJ SOLN
INTRAMUSCULAR | Status: AC
  Filled 2017-06-18: qty 1

## 2017-06-18 MED ORDER — BUPIVACAINE LIPOSOME 1.3 % IJ SUSP
INTRAMUSCULAR | Status: DC | PRN
  Administered 2017-06-18: 20 mL

## 2017-06-18 MED ORDER — FENTANYL CITRATE (PF) 100 MCG/2ML IJ SOLN
INTRAMUSCULAR | Status: DC | PRN
  Administered 2017-06-18 (×5): 50 ug via INTRAVENOUS

## 2017-06-18 MED ORDER — HYDROMORPHONE HCL 1 MG/ML IJ SOLN
INTRAMUSCULAR | Status: AC
  Filled 2017-06-18: qty 0.5

## 2017-06-18 MED ORDER — HYDROMORPHONE HCL 1 MG/ML IJ SOLN
0.2500 mg | INTRAMUSCULAR | Status: DC | PRN
  Administered 2017-06-18 (×3): 0.5 mg via INTRAVENOUS
  Filled 2017-06-18 (×2): qty 0.5

## 2017-06-18 MED ORDER — SUGAMMADEX SODIUM 200 MG/2ML IV SOLN
INTRAVENOUS | Status: AC
  Filled 2017-06-18: qty 2

## 2017-06-18 MED ORDER — OXYCODONE HCL 5 MG PO TABS: 5.0000 mg | ORAL_TABLET | ORAL | 0 refills | Status: DC | PRN

## 2017-06-18 MED ORDER — FENTANYL CITRATE (PF) 250 MCG/5ML IJ SOLN
INTRAMUSCULAR | Status: AC
  Filled 2017-06-18: qty 5

## 2017-06-18 MED ORDER — OXYCODONE HCL 5 MG/5ML PO SOLN: 5.0000 mg | Freq: Once | ORAL | Status: AC | PRN

## 2017-06-18 MED ORDER — ONDANSETRON HCL 4 MG/2ML IJ SOLN: 4.0000 mg | Freq: Once | INTRAMUSCULAR | Status: DC | PRN

## 2017-06-18 MED ORDER — ONDANSETRON HCL 4 MG/2ML IJ SOLN
INTRAMUSCULAR | Status: AC
  Filled 2017-06-18: qty 2

## 2017-06-18 MED ORDER — BUPIVACAINE LIPOSOME 1.3 % IJ SUSP
INTRAMUSCULAR | Status: AC
  Filled 2017-06-18: qty 20

## 2017-06-18 MED ORDER — DEXAMETHASONE SODIUM PHOSPHATE 4 MG/ML IJ SOLN
INTRAMUSCULAR | Status: AC
  Filled 2017-06-18: qty 1

## 2017-06-18 MED ORDER — SUGAMMADEX SODIUM 500 MG/5ML IV SOLN
INTRAVENOUS | Status: DC | PRN
  Administered 2017-06-18: 200 mg via INTRAVENOUS

## 2017-06-18 MED ORDER — MIDAZOLAM HCL 2 MG/2ML IJ SOLN: 0.5000 mg | INTRAMUSCULAR | Status: DC | PRN

## 2017-06-18 SURGICAL SUPPLY — 31 items
BAG HAMPER (MISCELLANEOUS) ×2 IMPLANT
CLOTH BEACON ORANGE TIMEOUT ST (SAFETY) ×2 IMPLANT
COVER LIGHT HANDLE STERIS (MISCELLANEOUS) ×4 IMPLANT
DRAPE HALF SHEET 40X57 (DRAPES) ×2 IMPLANT
DRAPE PROXIMA HALF (DRAPES) ×2 IMPLANT
ELECT REM PT RETURN 9FT ADLT (ELECTROSURGICAL) ×2
ELECTRODE REM PT RTRN 9FT ADLT (ELECTROSURGICAL) ×1 IMPLANT
GAUZE SPONGE 4X4 12PLY STRL (GAUZE/BANDAGES/DRESSINGS) ×3 IMPLANT
GLOVE BIO SURGEON STRL SZ 6.5 (GLOVE) ×4 IMPLANT
GLOVE BIO SURGEON STRL SZ7 (GLOVE) ×1 IMPLANT
GLOVE BIOGEL PI IND STRL 6.5 (GLOVE) ×1 IMPLANT
GLOVE BIOGEL PI IND STRL 7.0 (GLOVE) ×1 IMPLANT
GLOVE BIOGEL PI INDICATOR 6.5 (GLOVE) ×1
GLOVE BIOGEL PI INDICATOR 7.0 (GLOVE) ×2
GOWN STRL REUS W/TWL LRG LVL3 (GOWN DISPOSABLE) ×4 IMPLANT
HEMOSTAT SURGICEL 4X8 (HEMOSTASIS) ×2 IMPLANT
KIT ROOM TURNOVER AP CYSTO (KITS) ×2 IMPLANT
LIGASURE IMPACT 36 18CM CVD LR (INSTRUMENTS) ×2 IMPLANT
MANIFOLD NEPTUNE II (INSTRUMENTS) ×2 IMPLANT
NDL HYPO 18GX1.5 BLUNT FILL (NEEDLE) ×1 IMPLANT
NEEDLE HYPO 18GX1.5 BLUNT FILL (NEEDLE) ×2 IMPLANT
NEEDLE HYPO 22GX1.5 SAFETY (NEEDLE) ×2 IMPLANT
NS IRRIG 1000ML POUR BTL (IV SOLUTION) ×2 IMPLANT
PACK PERI GYN (CUSTOM PROCEDURE TRAY) ×2 IMPLANT
PAD ARMBOARD 7.5X6 YLW CONV (MISCELLANEOUS) ×2 IMPLANT
SET BASIN LINEN APH (SET/KITS/TRAYS/PACK) ×2 IMPLANT
SURGILUBE 3G PEEL PACK STRL (MISCELLANEOUS) ×2 IMPLANT
SUT SILK 0 FSL (SUTURE) ×2 IMPLANT
SUT VIC AB 2-0 CT2 27 (SUTURE) IMPLANT
SYR 20CC LL (SYRINGE) ×2 IMPLANT
SYR BULB IRRIGATION 50ML (SYRINGE) ×2 IMPLANT

## 2017-06-18 NOTE — Progress Notes (Signed)
[facility]  Patient reviewed on [place] substance database. Received roxicodone and percocet 3/8 and 3/26 for 3 and 4 day supplies. This was from the Ed and from hospitalist.   Needs post operative pain control. New Rx written.   Curlene Labrum, MD [facility] [address] [place],  [address] [phone] (office)

## 2017-06-18 NOTE — Interval H&P Note (Signed)
History and Physical Interval Note:  06/18/2017 7:07 AM  Angel Price  has presented today for surgery, with the diagnosis of bleeding hemorrhoids  The various methods of treatment have been discussed with the patient and family. After consideration of risks, benefits and other options for treatment, the patient has consented to  Procedure(s): EXTENSIVE HEMORRHOIDECTOMY (N/A) as a surgical intervention .  The patient's history has been reviewed, patient examined, no change in status, stable for surgery.  I have reviewed the patient's chart and labs.  Questions were answered to the patient's satisfaction.    No questions. Daughters with her today.   Virl Cagey

## 2017-06-18 NOTE — Op Note (Signed)
[facility] Operative Note  06/18/17  Preoperative Diagnosis: Hemorrhoids, internal grade II, minimal external hemorrhoidal tags    Postoperative Diagnosis: Same   Procedure(s) Performed: Extensive three column hemorrhoidectomy    Surgeon: Lanell Matar. Constance Haw, MD   Assistants: No qualified resident was available   Anesthesia: General endotracheal   Anesthesiologist: Dr. Pricilla Loveless Rick Duff    Specimens:  Hemorrhoidal columns    Estimated Blood Loss: 5cc    Blood Replacement: None    Complications: None   Wound Class: Dirty    Operative Indications: Angel Price is a 52 yo with multiple medical problems that was recently in the hospital with a GIB and has been known to have internal hemorrhoids for sometime. She reported that they have been bleeding on and off,, and she was ready to get them taken care of.  She was last scoped June 2018 and this also confirmed her hemorrhoidal tissue internally.  After a discussion of the risk and benefits of hemorrhoid surgery and after she went to see her PCP to get her BP and HR under control, she was scheduled for surgery.   Findings: Large internal hemorrhoids in all three anatomic columns, minimal external skin tag tissue    Procedure: The patient was taken to the operating room and placed supine. General endotracheal anesthesia was induced. Intravenous antibiotics were not administered. An orogastric tube positioned to decompress the stomach. She was then placed in lithotomy with all pressure points padded.  A JACHO Approved timeout was preformed.   The perineum and rectum were prepared and draped in the usual sterile fashion.   The anus was examined and there was minimal external skin tissue. Digital rectal exam revealed no masses and her tone was a bit loose with anesthesia.  An anoscope was placed. The anatomic columns were examined.  The left lateral column was the largest.  The left lateral column was retracted from the  sphincter muscle and external with a Debakey forceps.  The skin and mucosa was incised with an elliptical incision around the column with cautery.  The Ligasure was used to ligate the column as it was pulled away from the sphincter muscle. Care was taken to ensure the sphincter muscle complex was not involved in the Ligasure device.  This was used to take the column to the apex.  Allowing for adequate skin bridges between the columns, the right posterior and right anterior hemorrhoidal columns were taken in a similar fashion.  Hemostasis was ensured. A remaining posterior hemorrhoid was noted but this could not be taken due to the needed skin bridge between the right posterior and left lateral columns.  A surgicel roll with viscus lidocaine was placed in the rectum.     All counts were correct at the end of the case. The patient was awakened from anesthesia and extubate without complication.  The patient went to the PACU in stable condition. The surgicel roll will be removed in PACU prior to discharge home.    Curlene Labrum, MD [facility] [address] [place], [place] [address] [phone] (office)

## 2017-06-18 NOTE — Transfer of Care (Signed)
Immediate Anesthesia Transfer of Care Note  Patient: Angel Price  Procedure(s) Performed: THREE COLUMN EXTENSIVE HEMORRHOIDECTOMY (N/A Rectum)  Patient Location: PACU  Anesthesia Type:General  Level of Consciousness: awake, alert  and patient cooperative  Airway & Oxygen Therapy: Patient Spontanous Breathing and non-rebreather face mask  Post-op Assessment: Report given to RN and Post -op Vital signs reviewed and stable  Post vital signs: Reviewed and stable  Last Vitals:  Vitals Value Taken Time  BP    Temp    Pulse 75 06/18/2017  8:29 AM  Resp 36 06/18/2017  8:29 AM  SpO2 92 % 06/18/2017  8:29 AM  Vitals shown include unvalidated device data.  Last Pain:  Vitals:   06/18/17 0641  TempSrc: Oral  PainSc: 8       Patients Stated Pain Goal: 5 (74/08/14 4818)  Complications: No apparent anesthesia complications

## 2017-06-18 NOTE — Anesthesia Postprocedure Evaluation (Signed)
Anesthesia Post Note  Patient: Angel Price  Procedure(s) Performed: THREE COLUMN EXTENSIVE HEMORRHOIDECTOMY (N/A Rectum)  Patient location during evaluation: PACU Anesthesia Type: General Level of consciousness: awake and alert and patient cooperative Pain management: satisfactory to patient Vital Signs Assessment: post-procedure vital signs reviewed and stable Respiratory status: spontaneous breathing Cardiovascular status: stable Postop Assessment: no apparent nausea or vomiting Anesthetic complications: no     Last Vitals:  Vitals:   06/18/17 0922 06/18/17 0950  BP:  105/71  Pulse:  68  Resp:  (!) 22  Temp:  36.5 C  SpO2: 92% 94%    Last Pain:  Vitals:   06/18/17 0950  TempSrc: Oral  PainSc: 7                  Merlina Marchena

## 2017-06-18 NOTE — Anesthesia Preprocedure Evaluation (Signed)
Anesthesia Evaluation  Patient identified by MRN, date of birth, ID band Patient awake    Reviewed: Allergy & Precautions, NPO status , Patient's Chart, lab work & pertinent test results  Airway Mallampati: II  TM Distance: >3 FB Neck ROM: Full    Dental no notable dental hx.    Pulmonary Current Smoker,    Pulmonary exam normal breath sounds clear to auscultation       Cardiovascular hypertension, Normal cardiovascular exam Rhythm:Regular Rate:Normal     Neuro/Psych  Headaches, Anxiety Depression Bipolar Disorder  Neuromuscular disease CVA    GI/Hepatic hiatal hernia, GERD  ,  Endo/Other  Hypothyroidism   Renal/GU      Musculoskeletal   Abdominal   Peds  Hematology  (+) anemia ,   Anesthesia Other Findings   Reproductive/Obstetrics                             Anesthesia Physical Anesthesia Plan  ASA: III  Anesthesia Plan: General   Post-op Pain Management:    Induction: Intravenous  PONV Risk Score and Plan:   Airway Management Planned:   Additional Equipment:   Intra-op Plan:   Post-operative Plan: Extubation in OR  Informed Consent: I have reviewed the patients History and Physical, chart, labs and discussed the procedure including the risks, benefits and alternatives for the proposed anesthesia with the patient or authorized representative who has indicated his/her understanding and acceptance.   Dental advisory given  Plan Discussed with: CRNA  Anesthesia Plan Comments:         Anesthesia Quick Evaluation

## 2017-06-18 NOTE — Anesthesia Procedure Notes (Signed)
Procedure Name: Intubation Date/Time: 06/18/2017 7:42 AM Performed by: Vista Deck, CRNA Pre-anesthesia Checklist: Patient identified, Patient being monitored, Timeout performed, Emergency Drugs available and Suction available Patient Re-evaluated:Patient Re-evaluated prior to induction Oxygen Delivery Method: Circle System Utilized Preoxygenation: Pre-oxygenation with 100% oxygen Induction Type: IV induction Ventilation: Mask ventilation without difficulty Laryngoscope Size: Mac and 3 Grade View: Grade II Tube type: Oral Tube size: 7.0 mm Number of attempts: 1 Airway Equipment and Method: stylet Placement Confirmation: ETT inserted through vocal cords under direct vision,  positive ETCO2 and breath sounds checked- equal and bilateral Secured at: 21 cm Tube secured with: Tape Dental Injury: Teeth and Oropharynx as per pre-operative assessment

## 2017-06-18 NOTE — Discharge Instructions (Signed)
Please take your roxicodone as prescribed and alternate with tylenol every 4-6 hours.   Do not take any aspirin or NSAIDs, ibuprofen for 5 days.  Please keep the area clean and dry and take Sitz baths (swallow warm water baths) for comfort and after bowel movements.  Please keep your stools soft and take fiber daily (metamucil) and colace (over the counter) to help prevent constipation.  If you have not had a BM in 2 days, take miralax or milk of magnesia. If you still have not had a BM, then notify Dr. Constance Haw.  Expect some bleeding and discharge following the hemorrhoid surgery and significant pain.  Go to the ED with extensive bleeding, fevers, or chills.       Surgical Procedures for Hemorrhoids, Care After Refer to this sheet in the next few weeks. These instructions provide you with information about caring for yourself after your procedure. Your health care provider may also give you more specific instructions. Your treatment has been planned according to current medical practices, but problems sometimes occur. Call your health care provider if you have any problems or questions after your procedure. What can I expect after the procedure? After the procedure, it is common to have:  Rectal pain.  Pain when you are having a bowel movement.  Slight rectal bleeding.  Follow these instructions at home: Medicines  Take over-the-counter and prescription medicines only as told by your health care provider.  Do not drive or operate heavy machinery while taking prescription pain medicine.  Use a stool softener or a bulk laxative as told by your health care provider. Activity  Rest at home. Return to your normal activities as told by your health care provider.  Do not lift anything that is heavier than 10 lb (4.5 kg).  Do not sit for long periods of time. Take a walk every day or as told by your health care provider.  Do not strain to have a bowel movement. Do not spend a long time  sitting on the toilet. Eating and drinking  Eat foods that contain fiber, such as whole grains, beans, nuts, fruits, and vegetables.  Drink enough fluid to keep your urine clear or pale yellow. General instructions  Sit in a warm bath 2-3 times per day to relieve soreness or itching.  Keep all follow-up visits as told by your health care provider. This is important. Contact a health care provider if:  Your pain medicine is not helping.  You have a fever or chills.  You become constipated.  You have trouble passing urine. Get help right away if:  You have very bad rectal pain.  You have heavy bleeding from your rectum. This information is not intended to replace advice given to you by your health care provider. Make sure you discuss any questions you have with your health care provider. Document Released: 05/25/2003 Document Revised: 08/10/2015 Document Reviewed: 05/30/2014 Elsevier Interactive Patient Education  2018 [employer].   How to Take a [employer] A sitz bath is a warm water bath that is taken while you are sitting down. The water should only come up to your hips and should cover your buttocks. Your health care provider may recommend a sitz bath to help you:  Clean the lower part of your body, including your genital area.  With itching.  With pain.  With sore muscles or muscles that tighten or spasm.  How to take a sitz bath Take 3-4 sitz baths per day or as told  by your health care provider. 1. Partially fill a bathtub with warm water. You will only need the water to be deep enough to cover your hips and buttocks when you are sitting in it. 2. If your health care provider told you to put medicine in the water, follow the directions exactly. 3. Sit in the water and open the tub drain a little. 4. Turn on the warm water again to keep the tub at the correct level. Keep the water running constantly. 5. Soak in the water for 15-20 minutes or as told by your health  care provider. 6. After the sitz bath, pat the affected area dry first. Do not rub it. 7. Be careful when you stand up after the sitz bath because you may feel dizzy.  Contact a health care provider if:  Your symptoms get worse. Do not continue with sitz baths if your symptoms get worse.  You have new symptoms. Do not continue with sitz baths until you talk with your health care provider. This information is not intended to replace advice given to you by your health care provider. Make sure you discuss any questions you have with your health care provider. Document Released: 11/25/2003 Document Revised: 08/02/2015 Document Reviewed: 03/02/2014 Elsevier Interactive Patient Education  2018 [place] Anesthesia, Adult, Care After These instructions provide you with information about caring for yourself after your procedure. Your health care provider may also give you more specific instructions. Your treatment has been planned according to current medical practices, but problems sometimes occur. Call your health care provider if you have any problems or questions after your procedure. What can I expect after the procedure? After the procedure, it is common to have:  Vomiting.  A sore throat.  Mental slowness.  It is common to feel:  Nauseous.  Cold or shivery.  Sleepy.  Tired.  Sore or achy, even in parts of your body where you did not have surgery.  Follow these instructions at home: For at least 24 hours after the procedure:  Do not: ? Participate in activities where you could fall or become injured. ? Drive. ? Use heavy machinery. ? Drink alcohol. ? Take sleeping pills or medicines that cause drowsiness. ? Make important decisions or sign legal documents. ? Take care of children on your own.  Rest. Eating and drinking  If you vomit, drink water, juice, or soup when you can drink without vomiting.  Drink enough fluid to keep your urine clear or  pale yellow.  Make sure you have little or no nausea before eating solid foods.  Follow the diet recommended by your health care provider. General instructions  Have a responsible adult stay with you until you are awake and alert.  Return to your normal activities as told by your health care provider. Ask your health care provider what activities are safe for you.  Take over-the-counter and prescription medicines only as told by your health care provider.  If you smoke, do not smoke without supervision.  Keep all follow-up visits as told by your health care provider. This is important. Contact a health care provider if:  You continue to have nausea or vomiting at home, and medicines are not helpful.  You cannot drink fluids or start eating again.  You cannot urinate after 8-12 hours.  You develop a skin rash.  You have fever.  You have increasing redness at the site of your procedure. Get help right away if:  You have  difficulty breathing.  You have chest pain.  You have unexpected bleeding.  You feel that you are having a life-threatening or urgent problem. This information is not intended to replace advice given to you by your health care provider. Make sure you discuss any questions you have with your health care provider. Document Released: 06/10/2000 Document Revised: 08/07/2015 Document Reviewed: 02/16/2015 Elsevier Interactive Patient Education  [employer].

## 2017-06-19 ENCOUNTER — Encounter (HOSPITAL_COMMUNITY): Payer: Self-pay | Admitting: General Surgery

## 2017-06-23 ENCOUNTER — Encounter (HOSPITAL_COMMUNITY): Payer: Self-pay

## 2017-06-23 ENCOUNTER — Other Ambulatory Visit: Payer: Self-pay

## 2017-06-23 ENCOUNTER — Emergency Department (HOSPITAL_COMMUNITY)
Admission: EM | Admit: 2017-06-23 | Discharge: 2017-06-23 | Disposition: A | Discharge status: Home / Self Care | Payer: Medicare Other | Attending: Emergency Medicine | Admitting: Emergency Medicine

## 2017-06-23 DIAGNOSIS — K3184 Gastroparesis: Secondary | ICD-10-CM | POA: Insufficient documentation

## 2017-06-23 DIAGNOSIS — K625 Hemorrhage of anus and rectum: Secondary | ICD-10-CM | POA: Diagnosis not present

## 2017-06-23 DIAGNOSIS — F17210 Nicotine dependence, cigarettes, uncomplicated: Secondary | ICD-10-CM | POA: Diagnosis not present

## 2017-06-23 DIAGNOSIS — I10 Essential (primary) hypertension: Secondary | ICD-10-CM | POA: Insufficient documentation

## 2017-06-23 DIAGNOSIS — K6289 Other specified diseases of anus and rectum: Secondary | ICD-10-CM | POA: Insufficient documentation

## 2017-06-23 DIAGNOSIS — Z79899 Other long term (current) drug therapy: Secondary | ICD-10-CM | POA: Diagnosis not present

## 2017-06-23 DIAGNOSIS — R109 Unspecified abdominal pain: Secondary | ICD-10-CM | POA: Insufficient documentation

## 2017-06-23 LAB — CBC WITH DIFFERENTIAL/PLATELET
BASOS ABS: 0 10*3/uL (ref 0.0–0.1)
Basophils Relative: 0 %
EOS ABS: 0.1 10*3/uL (ref 0.0–0.7)
EOS PCT: 1 %
HCT: 31 % — ABNORMAL LOW (ref 36.0–46.0)
Hemoglobin: 10.6 g/dL — ABNORMAL LOW (ref 12.0–15.0)
LYMPHS PCT: 36 %
Lymphs Abs: 3.4 10*3/uL (ref 0.7–4.0)
MCH: 26.8 pg (ref 26.0–34.0)
MCHC: 34.2 g/dL (ref 30.0–36.0)
MCV: 78.3 fL (ref 78.0–100.0)
Monocytes Absolute: 1 10*3/uL (ref 0.1–1.0)
Monocytes Relative: 10 %
Neutro Abs: 4.9 10*3/uL (ref 1.7–7.7)
Neutrophils Relative %: 53 %
PLATELETS: 376 10*3/uL (ref 150–400)
RBC: 3.96 MIL/uL (ref 3.87–5.11)
RDW: 18.3 % — ABNORMAL HIGH (ref 11.5–15.5)
WBC: 9.4 10*3/uL (ref 4.0–10.5)

## 2017-06-23 LAB — COMPREHENSIVE METABOLIC PANEL
ALT: 14 U/L (ref 14–54)
AST: 16 U/L (ref 15–41)
Albumin: 3.1 g/dL — ABNORMAL LOW (ref 3.5–5.0)
Alkaline Phosphatase: 100 U/L (ref 38–126)
Anion gap: 10 (ref 5–15)
BUN: 8 mg/dL (ref 6–20)
CHLORIDE: 104 mmol/L (ref 101–111)
CO2: 22 mmol/L (ref 22–32)
CREATININE: 0.39 mg/dL — AB (ref 0.44–1.00)
Calcium: 8.8 mg/dL — ABNORMAL LOW (ref 8.9–10.3)
GFR calc Af Amer: >60 mL/min (ref >60)
GFR calc non Af Amer: >60 mL/min (ref >60)
GLUCOSE: 86 mg/dL (ref 65–99)
Potassium: 3.7 mmol/L (ref 3.5–5.1)
SODIUM: 136 mmol/L (ref 135–145)
Total Bilirubin: 0.7 mg/dL (ref 0.3–1.2)
Total Protein: 6.9 g/dL (ref 6.5–8.1)

## 2017-06-23 LAB — LIPASE, BLOOD: Lipase: 18 U/L (ref 11–51)

## 2017-06-23 MED ORDER — ONDANSETRON HCL 4 MG/2ML IJ SOLN
4.0000 mg | Freq: Once | INTRAMUSCULAR | Status: AC
  Administered 2017-06-23: 4 mg via INTRAVENOUS
  Filled 2017-06-23: qty 2

## 2017-06-23 MED ORDER — HYDROMORPHONE HCL 1 MG/ML IJ SOLN
1.0000 mg | Freq: Once | INTRAMUSCULAR | Status: AC
  Administered 2017-06-23: 1 mg via INTRAVENOUS
  Filled 2017-06-23: qty 1

## 2017-06-23 MED ORDER — HYDROMORPHONE HCL 2 MG PO TABS: 2.0000 mg | ORAL_TABLET | Freq: Four times a day (QID) | ORAL | 0 refills | Status: AC | PRN

## 2017-06-23 NOTE — ED Provider Notes (Signed)
[facility] EMERGENCY DEPARTMENT Provider Note   CSN: [id number] Arrival date & time: 06/23/17  1458     History   Chief Complaint Chief Complaint  Patient presents with  . Rectal Bleeding    HPI Angel Price is a 52 y.o. female.  Sent to the ED complaining of severe rectal pain severe abdominal pain and heavy rectal bleeding.  She states she had a hemorrhoid surgery on 4/3 by Dr. Constance Haw.  Since then she has been bleeding and needing to use sanitation pads.  She was sent home with Roxicet but could not take the medication because it caused her to itch all over.  She states she try to reach Dr. Constance Haw both Friday and today but was unsuccessful because she was in surgery.  She is complaining of dizziness.  She denies any fever chest pain.  The blood that she is been experiencing from her rectum is bright red.  She has also been passing stool.  The history is provided by the patient.  Rectal Bleeding  Quality:  Bright red Amount:  Moderate Duration:  6 days Timing:  Constant Chronicity:  New Context: hemorrhoids   Relieved by:  Nothing Worsened by:  Defecation Associated symptoms: abdominal pain, dizziness and light-headedness   Associated symptoms: no epistaxis, no fever, no hematemesis, no loss of consciousness, no recent illness and no vomiting   Abdominal pain:    Location:  Generalized   Severity:  Severe   Onset quality:  Unable to specify   Timing:  Constant   Progression:  Waxing and waning   Chronicity:  Chronic Risk factors: no anticoagulant use     Past Medical History:  Diagnosis Date  . Abscess    soft tissue  . Adrenal mass ([place])   . Alcohol abuse   . Anxiety   . Blood transfusion without reported diagnosis   . Chronic abdominal pain   . Chronic wound infection of abdomen   . Colon polyp    colonoscopy 04/2014  . Depression   . Diverticulosis    colonoscopy 04/2014 moderat pan colonic  . Gastritis    EGD 05/2014  . Gastroparesis Nov 2015  . GERD  (gastroesophageal reflux disease)   . Hemorrhoid    internal large  . Hiatal hernia   . History of Billroth II operation   . Hypertension   . Lung nodule    CT 02/2014 needs repeat 1 month  . Lung nodule < 6cm on CT 04/25/2014  . Lupus ([place])   . Nausea and vomiting    chronic, recurrent  . Pancreatitis   . Schatzki's ring    patent per EGD 04/2014  . Sickle cell trait ([place])   . Suicide attempt ([place])   . 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 Hemorrhoidal skin tag   . SBO (small bowel obstruction) ([place])   . Non-intractable vomiting with nausea   . Folate deficiency 05/11/2017  . Abdominal wall fistula   . Cellulitis of abdominal wall 01/02/2017  . Acute left hemiparesis ([place]) 12/05/2016  . Malingering 12/05/2016  . Weakness of left lower extremity 12/02/2016  . CVA (cerebral vascular accident) ([place]) 11/23/2016  . Bright red rectal bleeding 08/22/2016  . Hypotension due to blood loss   . Acute GI bleeding 05/24/2016  . Dieulafoy lesion of duodenum   . History of Billroth II operation   . Gastrointestinal hemorrhage 05/22/2016  .  Absolute anemia   . Acute pancreatitis 10/01/2015  . Abnormal CT scan of lung 10/01/2015  . Alcohol intoxication ([place])   . Left-sided weakness   . Psychosomatic factor in physical condition   . Upper GI bleed   . Diverticulosis of colon with hemorrhage   . Chronic wound infection of abdomen 12/22/2014  . Thyroid disease 12/22/2014  . HTN (hypertension) 12/22/2014  . Ataxia 11/01/2014  . Hemorrhoids 04/26/2014  . Lung nodule 04/26/2014  . Diverticulosis   . Gastritis   . Hiatal hernia   . Schatzki's ring   . Acute blood loss anemia 04/25/2014  . Sinus tachycardia 04/25/2014  . Hypokalemia 04/25/2014  . Hematemesis with nausea 04/25/2014  . Lung nodule < 6cm on CT 04/25/2014  . Intractable nausea and vomiting 04/24/2014  . Gastroparesis   . Abdominal pain 04/01/2014  .  Chronic abdominal pain 01/21/2014  . Gastroenteritis 12/10/2013  . Chronic abdominal wound infection 08/16/2013  . MDD (major depressive disorder), recurrent episode, severe ([place]) 06/27/2013  . Wrist laceration 06/24/2013  . Diarrhea 05/07/2013  . Rectal bleeding 05/07/2013  . Abnormal LFTs 05/07/2013  . Adrenal mass, left ([place]) 05/07/2013  . Abdominal wall abscess at site of surgical wound 04/19/2013  . Abdominal wall abscess 04/18/2013  . Frequent headaches 03/30/2013  . Sleep difficulties 03/30/2013  . Essential hypertension, benign 03/30/2013  . History of cocaine abuse 03/16/2013  . History of schizoaffective disorder 03/16/2013  . Bipolar disorder ([place]) 03/16/2013  . Personality disorder ([place]) 03/16/2013  . Tobacco abuse 03/16/2013  . Alcohol abuse 03/16/2013  . Palpitations 03/16/2013  . Poor vision 03/16/2013  . History of gastric bypass 03/16/2013  . Status post hysterectomy with oophorectomy 03/16/2013  . Hypothyroid 03/16/2013  . Lupus ([place]) 03/16/2013    Past Surgical History:  Procedure Laterality Date  . ABDOMINAL HYSTERECTOMY  2013   [place]  . ABDOMINAL SURGERY    . ADRENALECTOMY Right   . AGILE CAPSULE N/A 01/05/2015   Procedure: AGILE CAPSULE;  Surgeon: Daneil Dolin, MD;  Location: [facility];  Service: Endoscopy;  Laterality: N/A;  0700  . Billroth II procedure      [place], first 2000, 2005/2006.  Marland Kitchen BIOPSY  05/20/2013   Procedure: BIOPSIES OF ASCENDING AND SIGMOID COLON;  Surgeon: Daneil Dolin, MD;  Location: [facility] ORS;  Service: Endoscopy;;  . BIOPSY  04/26/2014   Procedure: BIOPSIES;  Surgeon: Danie Binder, MD;  Location: [facility] OR[facility];  Service: Endoscopy;;  . CHOLECYSTECTOMY    . COLONOSCOPY     in [place]  . COLONOSCOPY WITH PROPOFOL N/A 05/20/2013   Dr.Rourk- inadequate prep, normal appearing rectum, grossly normal colon aside from pancolonic diverticula, normal terminal ileum bx= unremarkable colonic mucosa. Due for early interval 2016.   Marland Kitchen  COLONOSCOPY WITH PROPOFOL N/A 04/26/2014   ZOX:WRUEAV ileum/one colon polyp removed/moderate pan-colonic diverticulosis/large internal hemorrhoids  . COLONOSCOPY WITH PROPOFOL N/A 12/23/2014   Dr.Rourk- minimal internal hemorrhoids, pancolonic diverticulosis  . COLONOSCOPY WITH PROPOFOL N/A 08/23/2016   Procedure: COLONOSCOPY WITH PROPOFOL;  Surgeon: Danie Binder, MD;  Location: [facility];  Service: Endoscopy;  Laterality: N/A;  . DEBRIDEMENT OF ABDOMINAL WALL ABSCESS N/A 02/08/2013   Procedure: DEBRIDEMENT OF ABDOMINAL WALL ABSCESS;  Surgeon: Jamesetta So, MD;  Location: [facility] ORS;  Service: General;  Laterality: N/A;  . ESOPHAGOGASTRODUODENOSCOPY (EGD) WITH PROPOFOL N/A 05/20/2013   Dr.Rourk- s/p prior gastric surgery with normal esophagus, residual gastric mucosa and patent efferent limb  . ESOPHAGOGASTRODUODENOSCOPY (EGD)  WITH PROPOFOL N/A 02/03/2014   Dr. Gala Romney:  s/p hemigastrectomy with retained gastric contents. Residual gastric mucosa and efferent limb appeared normal otherwise. Query gastroparesis.   Marland Kitchen ESOPHAGOGASTRODUODENOSCOPY (EGD) WITH PROPOFOL N/A 04/26/2014   HYW:[id number] ring/small HH/mild non-erosive gasrtitis/normal anastomosis  . ESOPHAGOGASTRODUODENOSCOPY (EGD) WITH PROPOFOL N/A 12/23/2014   Dr.Rourk- s/p prior hemigastrctomy, active oozing from anastomotic suture site, hemostasis achieved  . ESOPHAGOGASTRODUODENOSCOPY (EGD) WITH PROPOFOL N/A 05/23/2016   Dr. 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 ESOPHAGOGASTRODUODENOSCOPY (EGD) WITH PROPOFOL N/A 05/11/2017   Procedure: ESOPHAGOGASTRODUODENOSCOPY (EGD) WITH PROPOFOL;  Surgeon: Daneil Dolin, MD;  [facility];  Service: Endoscopy;  Laterality: N/A;  . HEMORRHOID SURGERY N/A 06/18/2017   Procedure: THREE COLUMN EXTENSIVE HEMORRHOIDECTOMY;  Surgeon: Virl Cagey, MD;  Location: [facility];  Service: General;  Laterality:  N/A;  . INCISION AND DRAINAGE ABSCESS N/A 01/06/2017   Procedure: INCISION AND DRAINAGE ABDOMINAL WALL ABSCESS;  Surgeon: Aviva Signs, MD;  Location: [facility];  Service: General;  Laterality: N/A;  . tendon repar Right    wrist  . WOUND EXPLORATION Right 06/24/2013   Procedure: exploration of traumatic wound right wrist;  Surgeon: Tennis Must, MD;  Location: [facility];  Service: Orthopedics;  Laterality: Right;     OB History    Gravida  4   Para  4   Term  4   Preterm      AB      Living  3     SAB      TAB      Ectopic      Multiple      Live Births               Home Medications    Prior to Admission medications   Medication Sig Start Date End Date Taking? Authorizing Provider  amLODipine (NORVASC) 10 MG tablet Take 0.5 tablets (5 mg total) by mouth daily. 01/03/17   Doreatha Lew, MD  atenolol (TENORMIN) 50 MG tablet Take 50 mg by mouth daily.    [provider]  cyclobenzaprine (FLEXERIL) 5 MG tablet Take 1 tablet (5 mg total) by mouth 3 (three) times daily as needed for muscle spasms. Patient not taking: Reported on 06/09/2017 05/23/17   Murlean Iba, MD  docusate sodium (COLACE) 100 MG capsule Take 1 capsule (100 mg total) by mouth 2 (two) times daily. 06/18/17 06/18/18  Virl Cagey, MD  escitalopram (LEXAPRO) 20 MG tablet Take 20 mg daily by mouth.    [provider]  fluticasone (VERAMYST) 27.5 MCG/SPRAY nasal spray Place 2 sprays daily as needed into the nose.    [provider]  hydrocortisone (ANUSOL-HC) 2.5 % rectal cream Place rectally 2 (two) times daily. 05/23/17   Johnson, Clanford L, MD  lisinopril (PRINIVIL,ZESTRIL) 20 MG tablet Take 1 tablet (20 mg total) by mouth daily. 11/26/16 11/26/17  Reyne Dumas, MD  ondansetron (ZOFRAN ODT) 4 MG disintegrating tablet Take 1 tablet (4 mg total) by mouth every 8 (eight) hours as needed for nausea or vomiting. 05/23/17   Johnson, Clanford L, MD  ondansetron (ZOFRAN) 8 MG  tablet Take 1 tablet (8 mg total) by mouth every 4 (four) hours as needed. 06/09/17   [name], MD  oxyCODONE (ROXICODONE) 5 MG immediate release tablet Take 1-2 tablets (5-10 mg total) by mouth every 4 (four) hours as needed for severe  pain or breakthrough pain. 06/18/17 06/18/18  Virl Cagey, MD  pantoprazole (PROTONIX) 40 MG tablet Take 1 tablet (40 mg total) by mouth 2 (two) times daily. 05/23/17 06/22/17  Murlean Iba, MD    Family History Family History  Problem Relation Age of Onset  . Brain cancer Son   . Schizophrenia Son   . Cancer Son        brain  . Lung cancer Father   . Cancer Father        mets  . Drug abuse Mother   . Breast cancer Maternal Aunt   . Bipolar disorder Maternal Aunt   . Drug abuse Maternal Aunt   . Colon cancer Maternal Grandmother        late 27s, early 34s  . Drug abuse Sister   . Drug abuse Brother   . Bipolar disorder Paternal Grandfather   . Bipolar disorder Cousin   . Liver disease Neg Hx     Social History Social History   Tobacco Use  . Smoking status: Current Every Day Smoker    Packs/day: 0.50    Years: 35.00    Pack years: 17.50    Types: Cigarettes  . Smokeless tobacco: Never Used  Substance Use Topics  . Alcohol use: No    Alcohol/week: 0.0 oz    Comment: 1 beer 4 days ago  . Drug use: No    Types: Cocaine    Comment: quit 09/2012     Allergies   Codeine; Morphine and related; and Reglan [metoclopramide]   Review of Systems Review of Systems  Constitutional: Negative for fever.  HENT: Negative for nosebleeds and sore throat.   Eyes: Negative for photophobia and discharge.  Respiratory: Negative for shortness of breath.   Cardiovascular: Negative for chest pain and palpitations.  Gastrointestinal: Positive for abdominal pain and hematochezia. Negative for hematemesis and vomiting.  Genitourinary: Negative for dysuria and frequency.  Musculoskeletal: Negative for joint swelling and neck pain.  Skin: Negative  for rash.  Neurological: Positive for dizziness and light-headedness. Negative for loss of consciousness.     Physical Exam Updated Vital Signs BP 95/74   Pulse 68   Temp 98.1 F (36.7 C) (Oral)   Resp 16   Ht 5\' 3"  (1.6 m)   Wt 60.3 kg (133 lb)   SpO2 98%   BMI 23.56 kg/m   Physical Exam  Constitutional: She appears well-developed and well-nourished. No distress.  HENT:  Head: Normocephalic and atraumatic.  Eyes: Conjunctivae are normal.  Neck: Neck supple.  Cardiovascular: Normal rate and regular rhythm.  No murmur heard. Pulmonary/Chest: Effort normal and breath sounds normal. No respiratory distress.  Abdominal: Soft. There is generalized tenderness. There is no rigidity, no rebound and no guarding. No hernia.  Genitourinary:  Genitourinary Comments: Deferred as patient examined by surgery  Musculoskeletal: She exhibits no edema or deformity.  Neurological: She is alert.  Skin: Skin is warm and dry.  Psychiatric: She has a normal mood and affect.  Nursing note and vitals reviewed.    ED Treatments / Results  Labs (all labs ordered are listed, but only abnormal results are displayed) Labs Reviewed  CBC WITH DIFFERENTIAL/PLATELET - Abnormal; Notable for the following components:      Result Value   Hemoglobin 10.6 (*)    HCT 31.0 (*)    RDW 18.3 (*)    All other components within normal limits  COMPREHENSIVE METABOLIC PANEL - Abnormal; Notable for the following components:  Creatinine, Ser 0.39 (*)    Calcium 8.8 (*)    Albumin 3.1 (*)    All other components within normal limits  LIPASE, BLOOD    EKG None  Radiology No results found.  Procedures Procedures (including critical care time)  Medications Ordered in ED Medications  HYDROmorphone (DILAUDID) injection 1 mg (has no administration in time range)     Initial Impression / Assessment and Plan / ED Course  I have reviewed the triage vital signs and the nursing notes.  Pertinent labs &  imaging results that were available during my care of the patient were reviewed by me and considered in my medical decision making (see chart for details).  Clinical Course as of Jun 25 1045  Mon Jun 23, 2017  1644 Patient was seen and examined by Dr. Arnoldo Morale for his partner.  He feels that the bleeding is manageable at home and that she can be discharged.  He is willing to write her a prescription for Dilaudid orally and he is taking care of that.  Patient is to follow-up with her surgeon this week.er hemoglobin here is 10.6 which is a lower than her preop but higher than its been in the past.    [MB]    Clinical Course User Index [MB] Hayden Rasmussen, MD     Final Clinical Impressions(s) / ED Diagnoses   Final diagnoses:  Rectal bleeding  Rectal pain    ED Discharge Orders    None       Hayden Rasmussen, MD 06/25/17 1048

## 2017-06-23 NOTE — ED Triage Notes (Signed)
Pt has hemorrhoid surgery on Wednesday. Had slight rectal bleeding this day and the doctor was aware. Pt state she is now having abdominal tenderness now and rectal bleeding. States she has filled up many sanitary napkins. Bright red blood.   VSS

## 2017-06-23 NOTE — ED Notes (Signed)
Small amount of red blood coming from rectum

## 2017-06-23 NOTE — Consult Note (Signed)
Reason for Consult: Postoperative pain and rectal bleeding, status post hemorrhoidectomy Referring Physician: Dr. Graciella Belton is an 52 y.o. female.  HPI: Patient is a 52 year old black female with multiple medical problems, status post hemorrhoidectomy 5 days ago who presents with rectal pain secondary to intolerance of the Roxicet that she was prescribed as well as rectal bleeding.  She states she has gone through several pads daily.  She denies any lightheadedness.  She is well-known to our service.  She does have a history of drug-seeking behavior.  When I asked her what pain medication she desires, she could not give me a definitive answer.  She states that she has passed some clots, but not lately.  She is already scheduled to see Dr. Constance Haw in follow-up next week.  Past Medical History:  Diagnosis Date  . Abscess    soft tissue  . Adrenal mass ([place])   . Alcohol abuse   . Anxiety   . Blood transfusion without reported diagnosis   . Chronic abdominal pain   . Chronic wound infection of abdomen   . Colon polyp    colonoscopy 04/2014  . Depression   . Diverticulosis    colonoscopy 04/2014 moderat pan colonic  . Gastritis    EGD 05/2014  . Gastroparesis Nov 2015  . GERD (gastroesophageal reflux disease)   . Hemorrhoid    internal large  . Hiatal hernia   . History of Billroth II operation   . Hypertension   . Lung nodule    CT 02/2014 needs repeat 1 month  . Lung nodule < 6cm on CT 04/25/2014  . Lupus ([place])   . Nausea and vomiting    chronic, recurrent  . Pancreatitis   . Schatzki's ring    patent per EGD 04/2014  . Sickle cell trait ([place])   . Suicide attempt ([place])   . 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 ABDOMINAL HYSTERECTOMY  2013   [place]  . ABDOMINAL SURGERY    . ADRENALECTOMY Right   . AGILE CAPSULE N/A 01/05/2015   Procedure: AGILE CAPSULE;  Surgeon: Daneil Dolin, MD;  [facility];  Service: Endoscopy;  Laterality: N/A;  0700  . Billroth II procedure      [place], first 2000, 2005/2006.  Marland Kitchen BIOPSY  05/20/2013   Procedure: BIOPSIES OF ASCENDING AND SIGMOID COLON;  Surgeon: Daneil Dolin, MD;  Location: [facility];  Service: Endoscopy;;  . BIOPSY  04/26/2014   Procedure: BIOPSIES;  Surgeon: Danie Binder, MD;  Location: [facility];  Service: Endoscopy;;  . CHOLECYSTECTOMY    . COLONOSCOPY     in [place]  . COLONOSCOPY WITH PROPOFOL N/A 05/20/2013   Dr.Rourk- inadequate prep, normal appearing rectum, grossly normal colon aside from pancolonic diverticula, normal terminal ileum bx= unremarkable colonic mucosa. Due for early interval 2016.   Marland Kitchen COLONOSCOPY WITH PROPOFOL N/A 04/26/2014   PPI:RJJOAC ileum/one colon polyp removed/moderate pan-colonic diverticulosis/large internal hemorrhoids  . COLONOSCOPY WITH PROPOFOL N/A 12/23/2014   Dr.Rourk- minimal internal hemorrhoids, pancolonic diverticulosis  . COLONOSCOPY WITH PROPOFOL N/A 08/23/2016   Procedure: COLONOSCOPY WITH PROPOFOL;  Surgeon: Danie Binder, MD;  [facility];  Service: Endoscopy;  Laterality: N/A;  . DEBRIDEMENT OF ABDOMINAL WALL ABSCESS N/A 02/08/2013   Procedure: DEBRIDEMENT OF ABDOMINAL WALL ABSCESS;  Surgeon: Jamesetta So, MD;  Location: [facility];  Service: General;  Laterality: N/A;  .  ESOPHAGOGASTRODUODENOSCOPY (EGD) WITH PROPOFOL N/A 05/20/2013   Dr.Rourk- s/p prior gastric surgery with normal esophagus, residual gastric mucosa and patent efferent limb  . ESOPHAGOGASTRODUODENOSCOPY (EGD) WITH PROPOFOL N/A 02/03/2014   Dr. Gala Romney:  s/p hemigastrectomy with retained gastric contents. Residual gastric mucosa and efferent limb appeared normal otherwise. Query gastroparesis.   Marland Kitchen ESOPHAGOGASTRODUODENOSCOPY (EGD) WITH PROPOFOL N/A 04/26/2014   AOZ:HYQMVHQI'O ring/small HH/mild non-erosive gasrtitis/normal anastomosis  . ESOPHAGOGASTRODUODENOSCOPY (EGD) WITH PROPOFOL N/A 12/23/2014   Dr.Rourk- s/p prior  hemigastrctomy, active oozing from anastomotic suture site, hemostasis achieved  . ESOPHAGOGASTRODUODENOSCOPY (EGD) WITH PROPOFOL N/A 05/23/2016   Dr. 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 ESOPHAGOGASTRODUODENOSCOPY (EGD) WITH PROPOFOL N/A 05/11/2017   Procedure: ESOPHAGOGASTRODUODENOSCOPY (EGD) WITH PROPOFOL;  Surgeon: Daneil Dolin, MD;  [facility];  Service: Endoscopy;  Laterality: N/A;  . HEMORRHOID SURGERY N/A 06/18/2017   Procedure: THREE COLUMN EXTENSIVE HEMORRHOIDECTOMY;  Surgeon: Virl Cagey, MD;  Location: [facility];  Service: General;  Laterality: N/A;  . INCISION AND DRAINAGE ABSCESS N/A 01/06/2017   Procedure: INCISION AND DRAINAGE ABDOMINAL WALL ABSCESS;  Surgeon: Aviva Signs, MD;  Location: [facility];  Service: General;  Laterality: N/A;  . tendon repar Right    wrist  . WOUND EXPLORATION Right 06/24/2013   Procedure: exploration of traumatic wound right wrist;  Surgeon: Tennis Must, MD;  Location: [facility];  Service: Orthopedics;  Laterality: Right;    Family History  Problem Relation Age of Onset  . Brain cancer Son   . Schizophrenia Son   . Cancer Son        brain  . Lung cancer Father   . Cancer Father        mets  . Drug abuse Mother   . Breast cancer Maternal Aunt   . Bipolar disorder Maternal Aunt   . Drug abuse Maternal Aunt   . Colon cancer Maternal Grandmother        late 62s, early 49s  . Drug abuse Sister   . Drug abuse Brother   . Bipolar disorder Paternal Grandfather   . Bipolar disorder Cousin   . Liver disease Neg Hx     Social History:  reports that she has been smoking cigarettes.  She has a 17.50 pack-year smoking history. She has never used smokeless tobacco. She reports that she does not drink alcohol or use drugs.  Allergies:  Allergies  Allergen Reactions  . Codeine Hives  . Morphine And Related Hives  . Reglan [Metoclopramide]  Other (See Comments)    EPS symptoms     Medications: I have reviewed the patient's current medications.  Results for orders placed or performed during the hospital encounter of 06/23/17 (from the past 48 hour(s))  CBC with Differential     Status: Abnormal   Collection Time: 06/23/17  3:59 PM  Result Value Ref Range   WBC 9.4 4.0 - 10.5 K/uL   RBC 3.96 3.87 - 5.11 MIL/uL   Hemoglobin 10.6 (L) 12.0 - 15.0 g/dL   HCT 31.0 (L) 36.0 - 46.0 %   MCV 78.3 78.0 - 100.0 fL   MCH 26.8 26.0 - 34.0 pg   MCHC 34.2 30.0 - 36.0 g/dL   RDW 18.3 (H) 11.5 - 15.5 %   Platelets 376 150 - 400 K/uL   Neutrophils Relative % 53 %   Neutro Abs 4.9 1.7 - 7.7 K/uL   Lymphocytes  Relative 36 %   Lymphs Abs 3.4 0.7 - 4.0 K/uL   Monocytes Relative 10 %   Monocytes Absolute 1.0 0.1 - 1.0 K/uL   Eosinophils Relative 1 %   Eosinophils Absolute 0.1 0.0 - 0.7 K/uL   Basophils Relative 0 %   Basophils Absolute 0.0 0.0 - 0.1 K/uL    Comment: Performed at [facility], [address], [place], [place] [address]    No results found.  ROS:  Pertinent items are noted in HPI.  Blood pressure 114/77, pulse 86, temperature 98.1 F (36.7 C), temperature source Oral, resp. rate 18, height 5\' 3"  (1.6 m), weight 133 lb (60.3 kg), SpO2 99 %. Physical Exam: Pleasant well-developed well-nourished black female in no acute distress. Rectal examination reveals no frank bleeding present.  No digital examination was performed.  She did have a pad on her underwear which did not have a significant amount of blood present. Hematocrit 31  Assessment/Plan: Impression: Postoperative pain secondary to hemorrhoidectomy. Plan: I told the patient that I would prescribe Dilaudid for pain, though she will only get a 1 week supply.  She is to follow-up with Dr. Constance Haw as previously prescribed.  Aviva Signs 06/23/2017, 4:49 PM

## 2017-06-23 NOTE — Discharge Instructions (Addendum)
You are evaluated in the emergency department for rectal pain and rectal bleeding after hemorrhoid surgery.  You were also seen by Dr. Constance Haw partner Dr. Arnoldo Morale.  Your blood count does not show a significant anemia.  Dr. Arnoldo Morale sent a prescription into your pharmacy for further pain medication.  You should keep well-hydrated and follow-up with Dr. Constance Haw for further management.

## 2017-06-24 ENCOUNTER — Telehealth: Payer: Self-pay | Admitting: General Surgery

## 2017-06-24 NOTE — Telephone Encounter (Signed)
[facility]  Patient went to the Ed yesterday. Feeling better. Has dilaudid from ED but has not filled it yet. See at follow up appt.   Curlene Labrum, MD [facility] [address] [place], [place] [address] [phone] (office)

## 2017-06-26 ENCOUNTER — Telehealth: Payer: Self-pay | Admitting: General Surgery

## 2017-06-26 NOTE — Telephone Encounter (Signed)
[facility]  Unable to talk to daughter about the patient's health issues and medications as not clear [facility] clearance/ permission on Epic.   Clinic staff have told the patient's daughter this information.  Daughter is worried because her mom has taken all but 1 of the 20 dilaudids that Dr. Arnoldo Morale prescribed her on 06/23/2017 when she came to the ED with pain.   No more Narcotic Rx to be written for the patient.   If family concerned they need to call EMS/ Police and go to ED for overdose. Clinic Staff Tristan Schroeder spoke to the daughter 06/26/17 and informed her of the above.   Curlene Labrum, MD [facility] [address] [place], [place] [address] [phone] (office)

## 2017-07-01 ENCOUNTER — Emergency Department (HOSPITAL_COMMUNITY): Payer: Medicare Other

## 2017-07-01 ENCOUNTER — Encounter (HOSPITAL_COMMUNITY): Payer: Self-pay | Admitting: Emergency Medicine

## 2017-07-01 ENCOUNTER — Emergency Department (HOSPITAL_COMMUNITY)
Admission: EM | Admit: 2017-07-01 | Discharge: 2017-07-01 | Disposition: A | Discharge status: Home / Self Care | Payer: Medicare Other | Attending: Emergency Medicine | Admitting: Emergency Medicine

## 2017-07-01 ENCOUNTER — Other Ambulatory Visit: Payer: Self-pay

## 2017-07-01 DIAGNOSIS — I10 Essential (primary) hypertension: Secondary | ICD-10-CM | POA: Diagnosis not present

## 2017-07-01 DIAGNOSIS — F17210 Nicotine dependence, cigarettes, uncomplicated: Secondary | ICD-10-CM | POA: Insufficient documentation

## 2017-07-01 DIAGNOSIS — R1013 Epigastric pain: Secondary | ICD-10-CM | POA: Diagnosis not present

## 2017-07-01 DIAGNOSIS — E039 Hypothyroidism, unspecified: Secondary | ICD-10-CM | POA: Diagnosis not present

## 2017-07-01 DIAGNOSIS — R197 Diarrhea, unspecified: Secondary | ICD-10-CM | POA: Insufficient documentation

## 2017-07-01 DIAGNOSIS — Z79899 Other long term (current) drug therapy: Secondary | ICD-10-CM | POA: Insufficient documentation

## 2017-07-01 DIAGNOSIS — R112 Nausea with vomiting, unspecified: Secondary | ICD-10-CM | POA: Insufficient documentation

## 2017-07-01 LAB — CBC WITH DIFFERENTIAL/PLATELET
BASOS ABS: 0 10*3/uL (ref 0.0–0.1)
Basophils Relative: 0 %
Eosinophils Absolute: 0 10*3/uL (ref 0.0–0.7)
Eosinophils Relative: 0 %
HEMATOCRIT: 34.4 % — AB (ref 36.0–46.0)
Hemoglobin: 11.4 g/dL — ABNORMAL LOW (ref 12.0–15.0)
LYMPHS PCT: 35 %
Lymphs Abs: 2.3 10*3/uL (ref 0.7–4.0)
MCH: 26.2 pg (ref 26.0–34.0)
MCHC: 33.1 g/dL (ref 30.0–36.0)
MCV: 79.1 fL (ref 78.0–100.0)
MONO ABS: 0.2 10*3/uL (ref 0.1–1.0)
Monocytes Relative: 4 %
NEUTROS ABS: 4.1 10*3/uL (ref 1.7–7.7)
Neutrophils Relative %: 61 %
Platelets: 409 10*3/uL — ABNORMAL HIGH (ref 150–400)
RBC: 4.35 MIL/uL (ref 3.87–5.11)
RDW: 18.6 % — AB (ref 11.5–15.5)
WBC: 6.7 10*3/uL (ref 4.0–10.5)

## 2017-07-01 LAB — COMPREHENSIVE METABOLIC PANEL
ALT: 23 U/L (ref 14–54)
AST: 36 U/L (ref 15–41)
Albumin: 3.6 g/dL (ref 3.5–5.0)
Alkaline Phosphatase: 108 U/L (ref 38–126)
Anion gap: 15 (ref 5–15)
BILIRUBIN TOTAL: 0.8 mg/dL (ref 0.3–1.2)
BUN: 10 mg/dL (ref 6–20)
CO2: 17 mmol/L — ABNORMAL LOW (ref 22–32)
CREATININE: 0.54 mg/dL (ref 0.44–1.00)
Calcium: 8.6 mg/dL — ABNORMAL LOW (ref 8.9–10.3)
Chloride: 98 mmol/L — ABNORMAL LOW (ref 101–111)
Glucose, Bld: 107 mg/dL — ABNORMAL HIGH (ref 65–99)
Potassium: 4.8 mmol/L (ref 3.5–5.1)
Sodium: 130 mmol/L — ABNORMAL LOW (ref 135–145)
TOTAL PROTEIN: 7.7 g/dL (ref 6.5–8.1)

## 2017-07-01 LAB — LIPASE, BLOOD: LIPASE: 19 U/L (ref 11–51)

## 2017-07-01 MED ORDER — FENTANYL CITRATE (PF) 100 MCG/2ML IJ SOLN
50.0000 ug | Freq: Once | INTRAMUSCULAR | Status: AC
  Administered 2017-07-01: 50 ug via INTRAVENOUS
  Filled 2017-07-01: qty 2

## 2017-07-01 MED ORDER — ONDANSETRON 4 MG PO TBDP: 4.0000 mg | ORAL_TABLET | Freq: Three times a day (TID) | ORAL | 0 refills | Status: DC | PRN

## 2017-07-01 MED ORDER — ONDANSETRON HCL 4 MG/2ML IJ SOLN
4.0000 mg | Freq: Once | INTRAMUSCULAR | Status: AC
  Administered 2017-07-01: 4 mg via INTRAVENOUS
  Filled 2017-07-01: qty 2

## 2017-07-01 MED ORDER — HYDROMORPHONE HCL 1 MG/ML IJ SOLN
1.0000 mg | Freq: Once | INTRAMUSCULAR | Status: AC
  Administered 2017-07-01: 1 mg via INTRAVENOUS
  Filled 2017-07-01: qty 1

## 2017-07-01 MED ORDER — SODIUM CHLORIDE 0.9 % IV BOLUS
1000.0000 mL | Freq: Once | INTRAVENOUS | Status: AC
  Administered 2017-07-01: 1000 mL via INTRAVENOUS

## 2017-07-01 MED ORDER — PROMETHAZINE HCL 25 MG/ML IJ SOLN
25.0000 mg | Freq: Once | INTRAMUSCULAR | Status: AC
  Administered 2017-07-01: 25 mg via INTRAMUSCULAR
  Filled 2017-07-01: qty 1

## 2017-07-01 MED ORDER — LOPERAMIDE HCL 2 MG PO CAPS: 2.0000 mg | ORAL_CAPSULE | Freq: Four times a day (QID) | ORAL | 0 refills | Status: DC | PRN

## 2017-07-01 NOTE — ED Notes (Signed)
Patient transported to X-ray 

## 2017-07-01 NOTE — Discharge Instructions (Signed)
Drink plenty of fluids -  Clear liquids

## 2017-07-01 NOTE — ED Provider Notes (Signed)
[facility] EMERGENCY DEPARTMENT Provider Note   CSN: [id number] Arrival date & time: 07/01/17  3734     History   Chief Complaint Chief Complaint  Patient presents with  . Abdominal Pain  . Emesis  . Diarrhea    HPI Angel Price is a 52 y.o. female.  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  She presents today stating that she has some epigastric pain associated with multiple episodes of watery diarrhea, multiple episodes of vomiting, she denies fevers, denies rashes, states that she recently had hemorrhoid surgery and has done very well without any recurrent rectal bleeding or pain.  She has not had any medications prior to arrival.  Denies any recent travel, fevers, sick contacts or antibiotics.  Past Medical History:  Diagnosis Date  . Abscess    soft tissue  . Adrenal mass ([place])   . Alcohol abuse   . Anxiety   . Blood transfusion without reported diagnosis   . Chronic abdominal pain   . Chronic wound infection of abdomen   . Colon polyp    colonoscopy 04/2014  . Depression   . Diverticulosis    colonoscopy 04/2014 moderat pan colonic  . Gastritis    EGD 05/2014  . Gastroparesis Nov 2015  . GERD (gastroesophageal reflux disease)   . Hemorrhoid    internal large  . Hiatal hernia   . History of Billroth II operation   . Hypertension   . Lung nodule    CT 02/2014 needs repeat 1 month  . Lung nodule < 6cm on CT 04/25/2014  . Lupus ([place])   . Nausea and vomiting    chronic, recurrent  . Pancreatitis   . Schatzki's ring    patent per EGD 04/2014  . Sickle cell trait ([place])   . Suicide attempt ([place])   . 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  Hemorrhoidal skin tag   . SBO (small bowel obstruction) ([place])   . Non-intractable vomiting with nausea   . Folate deficiency 05/11/2017  . Abdominal wall fistula   . Cellulitis of abdominal wall 01/02/2017  . Acute left hemiparesis ([place]) 12/05/2016  . Malingering 12/05/2016  . Weakness of left lower extremity 12/02/2016  . CVA (cerebral vascular accident) ([place]) 11/23/2016  . Bright red rectal bleeding 08/22/2016  . Hypotension due to blood loss   . Acute GI bleeding 05/24/2016  . Dieulafoy lesion of duodenum   . History of Billroth II operation   . Gastrointestinal hemorrhage 05/22/2016  . Absolute anemia   . Acute pancreatitis 10/01/2015  . Abnormal CT scan of lung 10/01/2015  . Alcohol intoxication ([place])   . Left-sided weakness   . Psychosomatic factor in physical condition   . Upper GI bleed   . Diverticulosis of colon with hemorrhage   . Chronic wound infection of abdomen 12/22/2014  . Thyroid disease 12/22/2014  . HTN (hypertension) 12/22/2014  . Ataxia 11/01/2014  . Hemorrhoids 04/26/2014  . Lung nodule 04/26/2014  . Diverticulosis   . Gastritis   . Hiatal hernia   . Schatzki's ring   . Acute blood loss anemia 04/25/2014  . Sinus tachycardia 04/25/2014  .  Hypokalemia 04/25/2014  . Hematemesis with nausea 04/25/2014  . Lung nodule < 6cm on CT 04/25/2014  . Intractable nausea and vomiting 04/24/2014  . Gastroparesis   . Abdominal pain 04/01/2014  . Chronic abdominal pain 01/21/2014  . Gastroenteritis 12/10/2013  . Chronic abdominal wound infection 08/16/2013  . MDD (major depressive disorder), recurrent episode, severe ([place]) 06/27/2013  . Wrist laceration 06/24/2013  . Diarrhea 05/07/2013  . Rectal bleeding 05/07/2013  . Abnormal LFTs 05/07/2013  . Adrenal mass, left ([place]) 05/07/2013  . Abdominal wall abscess at site of surgical wound 04/19/2013  . Abdominal wall abscess 04/18/2013  . Frequent headaches 03/30/2013  . Sleep difficulties 03/30/2013  .  Essential hypertension, benign 03/30/2013  . History of cocaine abuse 03/16/2013  . History of schizoaffective disorder 03/16/2013  . Bipolar disorder ([place]) 03/16/2013  . Personality disorder ([place]) 03/16/2013  . Tobacco abuse 03/16/2013  . Alcohol abuse 03/16/2013  . Palpitations 03/16/2013  . Poor vision 03/16/2013  . History of gastric bypass 03/16/2013  . Status post hysterectomy with oophorectomy 03/16/2013  . Hypothyroid 03/16/2013  . Lupus ([place]) 03/16/2013    Past Surgical History:  Procedure Laterality Date  . ABDOMINAL HYSTERECTOMY  2013   [place]  . ABDOMINAL SURGERY    . ADRENALECTOMY Right   . AGILE CAPSULE N/A 01/05/2015   Procedure: AGILE CAPSULE;  Surgeon: Daneil Dolin, MD;  Location: [facility] ENDO SU[facility];  Service: Endoscopy;  Laterality: N/A;  0700  . Billroth II procedure      [place], first 2000, 2005/2006.  Marland Kitchen BIOPSY  05/20/2013   Procedure: BIOPSIES OF ASCENDING AND SIGMOID COLON;  Surgeon: Daneil Dolin, MD;  Location: [facility] ORS;  Service: Endoscopy;;  . BIOPSY  04/26/2014   Procedure: BIOPSIES;  Surgeon: Danie Binder, MD;  Location: [facility] ORS;  Service: Endoscopy;;  . CHOLECYSTECTOMY    . COLONOSCOPY     in [place]  . COLONOSCOPY WITH PROPOFOL N/A 05/20/2013   Dr.Rourk- inadequate prep, normal appearing rectum, grossly normal colon aside from pancolonic diverticula, normal terminal ileum bx= unremarkable colonic mucosa. Due for early interval 2016.   Marland Kitchen COLONOSCOPY WITH PROPOFOL N/A 04/26/2014   URK:YHCWCB ileum/one colon polyp removed/moderate pan-colonic diverticulosis/large internal hemorrhoids  . COLONOSCOPY WITH PROPOFOL N/A 12/23/2014   Dr.Rourk- minimal internal hemorrhoids, pancolonic diverticulosis  . COLONOSCOPY WITH PROPOFOL N/A 08/23/2016   Procedure: COLONOSCOPY WITH PROPOFOL;  Surgeon: Danie Binder, MD;  Location: [facility];  Service: Endoscopy;  Laterality: N/A;  . DEBRIDEMENT OF ABDOMINAL WALL ABSCESS N/A 02/08/2013   Procedure: DEBRIDEMENT OF  ABDOMINAL WALL ABSCESS;  Surgeon: Jamesetta So, MD;  Location: [facility] ORS;  Service: General;  Laterality: N/A;  . ESOPHAGOGASTRODUODENOSCOPY (EGD) WITH PROPOFOL N/A 05/20/2013   Dr.Rourk- s/p prior gastric surgery with normal esophagus, residual gastric mucosa and patent efferent limb  . ESOPHAGOGASTRODUODENOSCOPY (EGD) WITH PROPOFOL N/A 02/03/2014   Dr. Gala Romney:  s/p hemigastrectomy with retained gastric contents. Residual gastric mucosa and efferent limb appeared normal otherwise. Query gastroparesis.   Marland Kitchen ESOPHAGOGASTRODUODENOSCOPY (EGD) WITH PROPOFOL N/A 04/26/2014   JSE:[id number]'Y ring/small HH/mild non-erosive gasrtitis/normal anastomosis  . ESOPHAGOGASTRODUODENOSCOPY (EGD) WITH PROPOFOL N/A 12/23/2014   Dr.Rourk- s/p prior hemigastrctomy, active oozing from anastomotic suture site, hemostasis achieved  . ESOPHAGOGASTRODUODENOSCOPY (EGD) WITH PROPOFOL N/A 05/23/2016   Dr. 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 ESOPHAGOGASTRODUODENOSCOPY (EGD) WITH PROPOFOL N/A 05/11/2017   Procedure: ESOPHAGOGASTRODUODENOSCOPY (EGD) WITH PROPOFOL;  Surgeon: Daneil Dolin, MD;  Location[facility];  Service: Endoscopy;  Laterality: N/A;  . HEMORRHOID SURGERY N/A 06/18/2017   Procedure: THREE COLUMN EXTENSIVE HEMORRHOIDECTOMY;  Surgeon: Virl Cagey, MD;  Location: [facility];  Service: General;  Laterality: N/A;  . INCISION AND DRAINAGE ABSCESS N/A 01/06/2017   Procedure: INCISION AND DRAINAGE ABDOMINAL WALL ABSCESS;  Surgeon: Aviva Signs, MD;  Location: [facility];  Service: General;  Laterality: N/A;  . tendon repar Right    wrist  . WOUND EXPLORATION Right 06/24/2013   Procedure: exploration of traumatic wound right wrist;  Surgeon: Tennis Must, MD;  Location: [place];  Service: Orthopedics;  Laterality: Right;     OB History    Gravida  4   Para  4   Term  4   Preterm      AB      Living  3     SAB       TAB      Ectopic      Multiple      Live Births               Home Medications    Prior to Admission medications   Medication Sig Start Date End Date Taking? Authorizing Provider  amLODipine (NORVASC) 10 MG tablet Take 0.5 tablets (5 mg total) by mouth daily. 01/03/17   Doreatha Lew, MD  atenolol (TENORMIN) 50 MG tablet Take 50 mg by mouth daily.    [provider]  docusate sodium (COLACE) 100 MG capsule Take 1 capsule (100 mg total) by mouth 2 (two) times daily. 06/18/17 06/18/18  Virl Cagey, MD  escitalopram (LEXAPRO) 20 MG tablet Take 20 mg daily by mouth.    [provider]  fluticasone (VERAMYST) 27.5 MCG/SPRAY nasal spray Place 2 sprays daily as needed into the nose.    [provider]  hydrocortisone (ANUSOL-HC) 2.5 % rectal cream Place rectally 2 (two) times daily. 05/23/17   Johnson, Clanford L, MD  HYDROmorphone (DILAUDID) 2 MG tablet Take 1 tablet (2 mg total) by mouth every 6 (six) hours as needed for severe pain. 06/23/17 06/23/18  Aviva Signs, MD  lisinopril (PRINIVIL,ZESTRIL) 20 MG tablet Take 1 tablet (20 mg total) by mouth daily. 11/26/16 11/26/17  Reyne Dumas, MD  loperamide (IMODIUM) 2 MG capsule Take 1 capsule (2 mg total) by mouth 4 (four) times daily as needed for diarrhea or loose stools. 07/01/17   Noemi Chapel, MD  ondansetron (ZOFRAN ODT) 4 MG disintegrating tablet Take 1 tablet (4 mg total) by mouth every 8 (eight) hours as needed for nausea. 07/01/17   Noemi Chapel, MD  pantoprazole (PROTONIX) 40 MG tablet Take 1 tablet (40 mg total) by mouth 2 (two) times daily. 05/23/17 06/22/17  Murlean Iba, MD    Family History Family History  Problem Relation Age of Onset  . Brain cancer Son   . Schizophrenia Son   . Cancer Son        brain  . Lung cancer Father   . Cancer Father        mets  . Drug abuse Mother   . Breast cancer Maternal Aunt   . Bipolar disorder Maternal Aunt   . Drug abuse Maternal Aunt   .  Colon cancer Maternal Grandmother        late 57s, early 37s  . Drug abuse Sister   . Drug abuse Brother   . Bipolar disorder Paternal Grandfather   .  Bipolar disorder Cousin   . Liver disease Neg Hx     Social History Social History   Tobacco Use  . Smoking status: Current Every Day Smoker    Packs/day: 0.50    Years: 35.00    Pack years: 17.50    Types: Cigarettes  . Smokeless tobacco: Never Used  Substance Use Topics  . Alcohol use: No    Alcohol/week: 0.0 oz    Comment: 1 beer 4 days ago  . Drug use: No    Types: Cocaine    Comment: quit 09/2012     Allergies   Codeine; Morphine and related; and Reglan [metoclopramide]   Review of Systems Review of Systems  All other systems reviewed and are negative.    Physical Exam Updated Vital Signs BP (!) 121/103 (BP Location: Right Arm)   Pulse 81   Temp 97.7 F (36.5 C) (Oral)   Resp 16   Wt 60.3 kg (133 lb)   SpO2 100%   BMI 23.56 kg/m   Physical Exam  Constitutional: She appears well-developed and well-nourished. No distress.  HENT:  Head: Normocephalic and atraumatic.  Mouth/Throat: Oropharynx is clear and moist. No oropharyngeal exudate.  Eyes: Pupils are equal, round, and reactive to light. Conjunctivae and EOM are normal. Right eye exhibits no discharge. Left eye exhibits no discharge. No scleral icterus.  Neck: Normal range of motion. Neck supple. No JVD present. No thyromegaly present.  Cardiovascular: Normal rate, regular rhythm, normal heart sounds and intact distal pulses. Exam reveals no gallop and no friction rub.  No murmur heard. Pulmonary/Chest: Effort normal and breath sounds normal. No respiratory distress. She has no wheezes. She has no rales.  Abdominal: Soft. Bowel sounds are normal. She exhibits no distension and no mass. There is tenderness.  Has some ttp in the mild and epigastric abd - there is no lower abd ttp - chronic draining wound in the mid abd X 2.  No redness or emphysema of the  abd wall.  Mild guarding to epigastrium - no pertineal signs.  Musculoskeletal: Normal range of motion. She exhibits no edema or tenderness.  Lymphadenopathy:    She has no cervical adenopathy.  Neurological: She is alert. Coordination normal.  Skin: Skin is warm and dry. No rash noted. No erythema.  Psychiatric: She has a normal mood and affect. Her behavior is normal.  Nursing note and vitals reviewed.   ED Treatments / Results  Labs (all labs ordered are listed, but only abnormal results are displayed) Labs Reviewed  CBC WITH DIFFERENTIAL/PLATELET - Abnormal; Notable for the following components:      Result Value   Hemoglobin 11.4 (*)    HCT 34.4 (*)    RDW 18.6 (*)    Platelets 409 (*)    All other components within normal limits  COMPREHENSIVE METABOLIC PANEL - Abnormal; Notable for the following components:   Sodium 130 (*)    Chloride 98 (*)    CO2 17 (*)    Glucose, Bld 107 (*)    Calcium 8.6 (*)    All other components within normal limits  LIPASE, BLOOD  URINALYSIS, ROUTINE W REFLEX MICROSCOPIC    EKG None  Radiology Dg Abd Acute W/chest  Result Date: 07/01/2017 CLINICAL DATA:  Abdominal pain, nausea, vomiting, and diarrhea. EXAM: DG ABDOMEN ACUTE W/ 1V CHEST COMPARISON:  Chest radiograph 05/17/2017. CT abdomen and pelvis 06/09/2017. FINDINGS: The cardiac silhouette remains enlarged. Subsegmental atelectasis is present in the right mid lung. The left  lung is clear. No sizable pleural effusion or pneumothorax is identified. No acute osseous abnormality is seen. An upright abdominal radiograph was obtained, however the patient subsequently requested to be returned to her room without allowing a supine abdominal radiograph to be obtained. This limits assessment, and the lower abdomen was not imaged. Gas is present in nondilated bowel in the included portion of the abdomen without dilated loops seen to suggest obstruction. Surgical clips are present in the upper abdomen.  No intraperitoneal free air is identified. IMPRESSION: 1. Right midlung atelectasis. 2. Incomplete abdominal examination. No evidence of bowel obstruction on this limited study. Electronically Signed   By: Logan Bores M.D.   On: 07/01/2017 20:29    Procedures Procedures (including critical care time)  Medications Ordered in ED Medications  sodium chloride 0.9 % bolus 1,000 mL (1,000 mLs Intravenous New Bag/Given 07/01/17 1935)  ondansetron (ZOFRAN) injection 4 mg (4 mg Intravenous Given 07/01/17 1935)  fentaNYL (SUBLIMAZE) injection 50 mcg (50 mcg Intravenous Given 07/01/17 1936)  promethazine (PHENERGAN) injection 25 mg (25 mg Intramuscular Given 07/01/17 1955)  HYDROmorphone (DILAUDID) injection 1 mg (1 mg Intravenous Given 07/01/17 2017)     Initial Impression / Assessment and Plan / ED Course  I have reviewed the triage vital signs and the nursing notes.  Pertinent labs & imaging results that were available during my care of the patient were reviewed by me and considered in my medical decision making (see chart for details).    The pt has chronic pain - there is no surgical findings but with history of alcohol abuse - needs eval with upright abd xray to r/o free air - lipase and labs - fluis and meds,  Had CT one month ago wtihout acute findings.  Labs are unremarkable with normal white blood cell count 6962, normal metabolic panel except for mild hyponatremia, there is no renal dysfunction or liver dysfunction.  Acute abdominal series shows no signs of obstruction and the patient feels clinically much better after getting a dose of medication both for pain nausea and some IV fluids.  She is amenable to discharge, will send home with Zofran oral dissolvable tablets as well as Imodium.  Final Clinical Impressions(s) / ED Diagnoses   Final diagnoses:  Diarrhea, unspecified type  Non-intractable vomiting with nausea, unspecified vomiting type    ED Discharge Orders        Ordered     ondansetron (ZOFRAN ODT) 4 MG disintegrating tablet  Every 8 hours PRN     07/01/17 2117    loperamide (IMODIUM) 2 MG capsule  4 times daily PRN     07/01/17 2117       Noemi Chapel, MD 07/01/17 2123

## 2017-07-01 NOTE — ED Triage Notes (Signed)
Pt c/o abd pain with n/v/d started today. Emesis noted in cup pt brought in. gen weakness noted

## 2017-07-02 ENCOUNTER — Emergency Department (HOSPITAL_COMMUNITY): Payer: Medicare Other

## 2017-07-02 ENCOUNTER — Encounter (HOSPITAL_COMMUNITY): Payer: Self-pay | Admitting: Emergency Medicine

## 2017-07-02 ENCOUNTER — Emergency Department (HOSPITAL_COMMUNITY)
Admission: EM | Admit: 2017-07-02 | Discharge: 2017-07-02 | Disposition: A | Discharge status: Home / Self Care | Payer: Medicare Other | Attending: Emergency Medicine | Admitting: Emergency Medicine

## 2017-07-02 ENCOUNTER — Other Ambulatory Visit: Payer: Self-pay

## 2017-07-02 DIAGNOSIS — G8929 Other chronic pain: Secondary | ICD-10-CM | POA: Insufficient documentation

## 2017-07-02 DIAGNOSIS — I10 Essential (primary) hypertension: Secondary | ICD-10-CM | POA: Diagnosis not present

## 2017-07-02 DIAGNOSIS — R112 Nausea with vomiting, unspecified: Secondary | ICD-10-CM | POA: Insufficient documentation

## 2017-07-02 DIAGNOSIS — R110 Nausea: Secondary | ICD-10-CM | POA: Diagnosis not present

## 2017-07-02 DIAGNOSIS — R109 Unspecified abdominal pain: Secondary | ICD-10-CM | POA: Diagnosis not present

## 2017-07-02 DIAGNOSIS — F17210 Nicotine dependence, cigarettes, uncomplicated: Secondary | ICD-10-CM | POA: Diagnosis not present

## 2017-07-02 DIAGNOSIS — Z79899 Other long term (current) drug therapy: Secondary | ICD-10-CM | POA: Insufficient documentation

## 2017-07-02 DIAGNOSIS — R1013 Epigastric pain: Secondary | ICD-10-CM | POA: Diagnosis not present

## 2017-07-02 DIAGNOSIS — E039 Hypothyroidism, unspecified: Secondary | ICD-10-CM | POA: Insufficient documentation

## 2017-07-02 DIAGNOSIS — R079 Chest pain, unspecified: Secondary | ICD-10-CM | POA: Diagnosis not present

## 2017-07-02 HISTORY — DX: Malingerer (conscious simulation): Z76.5

## 2017-07-02 LAB — LIPASE, BLOOD: Lipase: 18 U/L (ref 11–51)

## 2017-07-02 LAB — URINALYSIS, ROUTINE W REFLEX MICROSCOPIC
BILIRUBIN URINE: NEGATIVE
GLUCOSE, UA: NEGATIVE mg/dL
Hgb urine dipstick: NEGATIVE
Ketones, ur: 20 mg/dL — AB
Leukocytes, UA: NEGATIVE
NITRITE: NEGATIVE
PH: 6 (ref 5.0–8.0)
Protein, ur: NEGATIVE mg/dL
SPECIFIC GRAVITY, URINE: 1.01 (ref 1.005–1.030)

## 2017-07-02 LAB — CBC WITH DIFFERENTIAL/PLATELET
BASOS PCT: 0 %
Basophils Absolute: 0 10*3/uL (ref 0.0–0.1)
Eosinophils Absolute: 0 10*3/uL (ref 0.0–0.7)
Eosinophils Relative: 0 %
HEMATOCRIT: 36 % (ref 36.0–46.0)
HEMOGLOBIN: 12.3 g/dL (ref 12.0–15.0)
LYMPHS ABS: 2.5 10*3/uL (ref 0.7–4.0)
LYMPHS PCT: 32 %
MCH: 26.5 pg (ref 26.0–34.0)
MCHC: 34.2 g/dL (ref 30.0–36.0)
MCV: 77.4 fL — AB (ref 78.0–100.0)
MONO ABS: 0.6 10*3/uL (ref 0.1–1.0)
MONOS PCT: 7 %
NEUTROS ABS: 4.7 10*3/uL (ref 1.7–7.7)
NEUTROS PCT: 61 %
Platelets: 475 10*3/uL — ABNORMAL HIGH (ref 150–400)
RBC: 4.65 MIL/uL (ref 3.87–5.11)
RDW: 18.8 % — AB (ref 11.5–15.5)
WBC: 7.8 10*3/uL (ref 4.0–10.5)

## 2017-07-02 LAB — TROPONIN I: Troponin I: <0.03 ng/mL (ref <0.03)

## 2017-07-02 LAB — COMPREHENSIVE METABOLIC PANEL
ALBUMIN: 3.9 g/dL (ref 3.5–5.0)
ALT: 20 U/L (ref 14–54)
AST: 19 U/L (ref 15–41)
Alkaline Phosphatase: 127 U/L — ABNORMAL HIGH (ref 38–126)
Anion gap: 17 — ABNORMAL HIGH (ref 5–15)
BILIRUBIN TOTAL: 1.3 mg/dL — AB (ref 0.3–1.2)
BUN: 5 mg/dL — ABNORMAL LOW (ref 6–20)
CHLORIDE: 91 mmol/L — AB (ref 101–111)
CO2: 20 mmol/L — AB (ref 22–32)
Calcium: 9.6 mg/dL (ref 8.9–10.3)
Creatinine, Ser: 0.58 mg/dL (ref 0.44–1.00)
GFR calc Af Amer: >60 mL/min (ref >60)
GFR calc non Af Amer: >60 mL/min (ref >60)
GLUCOSE: 90 mg/dL (ref 65–99)
POTASSIUM: 3.4 mmol/L — AB (ref 3.5–5.1)
SODIUM: 128 mmol/L — AB (ref 135–145)
Total Protein: 8.1 g/dL (ref 6.5–8.1)

## 2017-07-02 MED ORDER — PROMETHAZINE HCL 25 MG RE SUPP: 25.0000 mg | Freq: Four times a day (QID) | RECTAL | 0 refills | Status: DC | PRN

## 2017-07-02 MED ORDER — ONDANSETRON HCL 4 MG/2ML IJ SOLN: 4.0000 mg | INTRAMUSCULAR | Status: DC | PRN

## 2017-07-02 MED ORDER — HYDROMORPHONE HCL 2 MG PO TABS
2.0000 mg | ORAL_TABLET | Freq: Once | ORAL | Status: AC
  Administered 2017-07-02: 2 mg via ORAL
  Filled 2017-07-02: qty 1

## 2017-07-02 MED ORDER — PROMETHAZINE HCL 25 MG/ML IJ SOLN
12.5000 mg | Freq: Once | INTRAMUSCULAR | Status: AC
  Administered 2017-07-02: 12.5 mg via INTRAVENOUS
  Filled 2017-07-02: qty 1

## 2017-07-02 MED ORDER — FAMOTIDINE IN NACL 20-0.9 MG/50ML-% IV SOLN
20.0000 mg | Freq: Once | INTRAVENOUS | Status: AC
  Administered 2017-07-02: 20 mg via INTRAVENOUS
  Filled 2017-07-02: qty 50

## 2017-07-02 MED ORDER — GI COCKTAIL ~~LOC~~
30.0000 mL | Freq: Once | ORAL | Status: AC
Start: 2017-07-02 — End: 2017-07-02
  Administered 2017-07-02: 30 mL via ORAL
  Filled 2017-07-02: qty 30

## 2017-07-02 MED ORDER — POTASSIUM CHLORIDE CRYS ER 20 MEQ PO TBCR
40.0000 meq | EXTENDED_RELEASE_TABLET | Freq: Once | ORAL | Status: AC
  Administered 2017-07-02: 40 meq via ORAL
  Filled 2017-07-02: qty 2

## 2017-07-02 MED ORDER — DIPHENHYDRAMINE HCL 50 MG/ML IJ SOLN
50.0000 mg | Freq: Once | INTRAMUSCULAR | Status: AC
  Administered 2017-07-02: 50 mg via INTRAVENOUS
  Filled 2017-07-02: qty 1

## 2017-07-02 MED ORDER — SODIUM CHLORIDE 0.9 % IV BOLUS
1000.0000 mL | Freq: Once | INTRAVENOUS | Status: AC
  Administered 2017-07-02: 1000 mL via INTRAVENOUS

## 2017-07-02 NOTE — ED Notes (Signed)
Patient transported to X-ray 

## 2017-07-02 NOTE — Discharge Instructions (Signed)
Take the prescription as directed.  Increase your fluid intake (ie:  Gatoraide) for the next few days.  Eat a bland diet and advance to your regular diet slowly as you can tolerate it. Call your regular medical doctor tomorrow to schedule a follow up appointment in the next 2 to 3 days. Return to the Emergency Department immediately sooner if worsening.

## 2017-07-02 NOTE — ED Provider Notes (Signed)
[facility] EMERGENCY DEPARTMENT Provider Note   CSN: [id number] Arrival date & time: 07/02/17  1758     History   Chief Complaint Chief Complaint  Patient presents with  . Chest Pain  . Abdominal Pain    HPI Angel Price is a 52 y.o. female.  HPI  Pt was seen at 1830.  Per pt, c/o gradual onset and persistence of constant acute flair of her chronic abd "pain" for the past 2 days.  Describes the abd pain as located in her epigastric area, "sharp," "constant" with radiation into her lower chest. Has been associated with multiple intermittent episodes of N/V. Denies diarrhea, no fevers, no back pain, no rash, no CP/SOB, no black or blood in stools or emesis. The symptoms have been associated with no other complaints. The patient has a significant history of similar symptoms previously, recently being evaluated for this complaint and multiple prior evals for same. Pt's most recent evaluation yesterday was reassuring.        Past Medical History:  Diagnosis Date  . Abscess    soft tissue  . Adrenal mass ([place])   . Alcohol abuse   . Anxiety   . Blood transfusion without reported diagnosis   . Chronic abdominal pain   . Chronic wound infection of abdomen   . Colon polyp    colonoscopy 04/2014  . Depression   . Diverticulosis    colonoscopy 04/2014 moderat pan colonic  . Drug-seeking behavior   . Gastritis    EGD 05/2014  . Gastroparesis Nov 2015  . GERD (gastroesophageal reflux disease)   . Hemorrhoid    internal large  . Hiatal hernia   . History of Billroth II operation   . Hypertension   . Lung nodule    CT 02/2014 needs repeat 1 month  . Lung nodule < 6cm on CT 04/25/2014  . Lupus ([place])   . Malingering   . Nausea and vomiting    chronic, recurrent  . Pancreatitis   . Schatzki's ring    patent per EGD 04/2014  . Sickle cell trait ([place])   . Suicide attempt ([place])   . Thyroid disease 2000   overactive, radiation    Patient Active Problem List   Diagnosis Date  Noted  . Hemorrhoidal skin tag   . SBO (small bowel obstruction) ([place])   . Non-intractable vomiting with nausea   . Folate deficiency 05/11/2017  . Abdominal wall fistula   . Cellulitis of abdominal wall 01/02/2017  . Acute left hemiparesis ([place]) 12/05/2016  . Malingering 12/05/2016  . Weakness of left lower extremity 12/02/2016  . CVA (cerebral vascular accident) ([place]) 11/23/2016  . Bright red rectal bleeding 08/22/2016  . Hypotension due to blood loss   . Acute GI bleeding 05/24/2016  . Dieulafoy lesion of duodenum   . History of Billroth II operation   . Gastrointestinal hemorrhage 05/22/2016  . Absolute anemia   . Acute pancreatitis 10/01/2015  . Abnormal CT scan of lung 10/01/2015  . Alcohol intoxication ([place])   . Left-sided weakness   . Psychosomatic factor in physical condition   . Upper GI bleed   . Diverticulosis of colon with hemorrhage   . Chronic wound infection of abdomen 12/22/2014  . Thyroid disease 12/22/2014  . HTN (hypertension) 12/22/2014  . Ataxia 11/01/2014  . Hemorrhoids 04/26/2014  . Lung nodule 04/26/2014  . Diverticulosis   . Gastritis   . Hiatal hernia   . Schatzki's ring   . Acute  blood loss anemia 04/25/2014  . Sinus tachycardia 04/25/2014  . Hypokalemia 04/25/2014  . Hematemesis with nausea 04/25/2014  . Lung nodule < 6cm on CT 04/25/2014  . Intractable nausea and vomiting 04/24/2014  . Gastroparesis   . Abdominal pain 04/01/2014  . Chronic abdominal pain 01/21/2014  . Gastroenteritis 12/10/2013  . Chronic abdominal wound infection 08/16/2013  . MDD (major depressive disorder), recurrent episode, severe ([place]) 06/27/2013  . Wrist laceration 06/24/2013  . Diarrhea 05/07/2013  . Rectal bleeding 05/07/2013  . Abnormal LFTs 05/07/2013  . Adrenal mass, left ([place]) 05/07/2013  . Abdominal wall abscess at site of surgical wound 04/19/2013  . Abdominal wall abscess 04/18/2013  . Frequent headaches 03/30/2013  . Sleep difficulties 03/30/2013    . Essential hypertension, benign 03/30/2013  . History of cocaine abuse 03/16/2013  . History of schizoaffective disorder 03/16/2013  . Bipolar disorder ([place]) 03/16/2013  . Personality disorder ([place]) 03/16/2013  . Tobacco abuse 03/16/2013  . Alcohol abuse 03/16/2013  . Palpitations 03/16/2013  . Poor vision 03/16/2013  . History of gastric bypass 03/16/2013  . Status post hysterectomy with oophorectomy 03/16/2013  . Hypothyroid 03/16/2013  . Lupus ([place]) 03/16/2013    Past Surgical History:  Procedure Laterality Date  . ABDOMINAL HYSTERECTOMY  2013   [place]  . ABDOMINAL SURGERY    . ADRENALECTOMY Right   . AGILE CAPSULE N/A 01/05/2015   Procedure: AGILE CAPSULE;  Surgeon: Daneil Dolin, MD;  Location: [facility] ENDO SUITE;  Service: Endoscopy;  Laterality: N/A;  0700  . Billroth II procedure      [place], first 2000, 2005/2006.  Marland Kitchen BIOPSY  05/20/2013   Procedure: BIOPSIES OF ASCENDING AND SIGMOID COLON;  Surgeon: Daneil Dolin, MD;  Location: [facility] ORS;  Service: Endoscopy;;  . BIOPSY  04/26/2014   Procedure: BIOPSIES;  Surgeon: Danie Binder, MD;  Location: [facility] ORS;  Service: Endoscopy;;  . CHOLECYSTECTOMY    . COLONOSCOPY     in [place]  . COLONOSCOPY WITH PROPOFOL N/A 05/20/2013   Dr.Rourk- inadequate prep, normal appearing rectum, grossly normal colon aside from pancolonic diverticula, normal terminal ileum bx= unremarkable colonic mucosa. Due for early interval 2016.   Marland Kitchen COLONOSCOPY WITH PROPOFOL N/A 04/26/2014   NIO:EVOJJK ileum/one colon polyp removed/moderate pan-colonic diverticulosis/large internal hemorrhoids  . COLONOSCOPY WITH PROPOFOL N/A 12/23/2014   Dr.Rourk- minimal internal hemorrhoids, pancolonic diverticulosis  . COLONOSCOPY WITH PROPOFOL N/A 08/23/2016   Procedure: COLONOSCOPY WITH PROPOFOL;  Surgeon: Danie Binder, MD;  Location: [facility];  Service: Endoscopy;  Laterality: N/A;  . DEBRIDEMENT OF ABDOMINAL WALL ABSCESS N/A 02/08/2013   Procedure: DEBRIDEMENT OF  ABDOMINAL WALL ABSCESS;  Surgeon: Jamesetta So, MD;  Location: [facility] ORS;  Service: General;  Laterality: N/A;  . ESOPHAGOGASTRODUODENOSCOPY (EGD) WITH PROPOFOL N/A 05/20/2013   Dr.Rourk- s/p prior gastric surgery with normal esophagus, residual gastric mucosa and patent efferent limb  . ESOPHAGOGASTRODUODENOSCOPY (EGD) WITH PROPOFOL N/A 02/03/2014   Dr. Gala Romney:  s/p hemigastrectomy with retained gastric contents. Residual gastric mucosa and efferent limb appeared normal otherwise. Query gastroparesis.   Marland Kitchen ESOPHAGOGASTRODUODENOSCOPY (EGD) WITH PROPOFOL N/A 04/26/2014   KXF:[id number] ring/small HH/mild non-erosive gasrtitis/normal anastomosis  . ESOPHAGOGASTRODUODENOSCOPY (EGD) WITH PROPOFOL N/A 12/23/2014   Dr.Rourk- s/p prior hemigastrctomy, active oozing from anastomotic suture site, hemostasis achieved  . ESOPHAGOGASTRODUODENOSCOPY (EGD) WITH PROPOFOL N/A 05/23/2016   Dr. 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 ESOPHAGOGASTRODUODENOSCOPY (EGD)  WITH PROPOFOL N/A 05/11/2017   Procedure: ESOPHAGOGASTRODUODENOSCOPY (EGD) WITH PROPOFOL;  Surgeon: Daneil Dolin, MD;  Location: [facility];  Service: Endoscopy;  Laterality: N/A;  . HEMORRHOID SURGERY N/A 06/18/2017   Procedure: THREE COLUMN EXTENSIVE HEMORRHOIDECTOMY;  Surgeon: Virl Cagey, MD;  Location: [facility] ORS;  Service: General;  Laterality: N/A;  . INCISION AND DRAINAGE ABSCESS N/A 01/06/2017   Procedure: INCISION AND DRAINAGE ABDOMINAL WALL ABSCESS;  Surgeon: Aviva Signs, MD;  Location: [facility];  Service: General;  Laterality: N/A;  . tendon repar Right    wrist  . WOUND EXPLORATION Right 06/24/2013   Procedure: exploration of traumatic wound right wrist;  Surgeon: Tennis Must, MD;  Location: [place];  Service: Orthopedics;  Laterality: Right;     OB History    Gravida  4   Para  4   Term  4   Preterm      AB      Living  3     SAB       TAB      Ectopic      Multiple      Live Births               Home Medications    Prior to Admission medications   Medication Sig Start Date End Date Taking? Authorizing Provider  amLODipine (NORVASC) 10 MG tablet Take 0.5 tablets (5 mg total) by mouth daily. 01/03/17   Doreatha Lew, MD  atenolol (TENORMIN) 50 MG tablet Take 50 mg by mouth daily.    [provider]  docusate sodium (COLACE) 100 MG capsule Take 1 capsule (100 mg total) by mouth 2 (two) times daily. 06/18/17 06/18/18  Virl Cagey, MD  escitalopram (LEXAPRO) 20 MG tablet Take 20 mg daily by mouth.    [provider]  fluticasone (VERAMYST) 27.5 MCG/SPRAY nasal spray Place 2 sprays daily as needed into the nose.    [provider]  hydrocortisone (ANUSOL-HC) 2.5 % rectal cream Place rectally 2 (two) times daily. 05/23/17   Johnson, Clanford L, MD  HYDROmorphone (DILAUDID) 2 MG tablet Take 1 tablet (2 mg total) by mouth every 6 (six) hours as needed for severe pain. 06/23/17 06/23/18  Aviva Signs, MD  lisinopril (PRINIVIL,ZESTRIL) 20 MG tablet Take 1 tablet (20 mg total) by mouth daily. 11/26/16 11/26/17  Reyne Dumas, MD  loperamide (IMODIUM) 2 MG capsule Take 1 capsule (2 mg total) by mouth 4 (four) times daily as needed for diarrhea or loose stools. 07/01/17   Noemi Chapel, MD  ondansetron (ZOFRAN ODT) 4 MG disintegrating tablet Take 1 tablet (4 mg total) by mouth every 8 (eight) hours as needed for nausea. 07/01/17   Noemi Chapel, MD  pantoprazole (PROTONIX) 40 MG tablet Take 1 tablet (40 mg total) by mouth 2 (two) times daily. 05/23/17 06/22/17  Murlean Iba, MD    Family History Family History  Problem Relation Age of Onset  . Brain cancer Son   . Schizophrenia Son   . Cancer Son        brain  . Lung cancer Father   . Cancer Father        mets  . Drug abuse Mother   . Breast cancer Maternal Aunt   . Bipolar disorder Maternal Aunt   . Drug abuse Maternal Aunt   .  Colon cancer Maternal Grandmother        late 61s, early 54s  . Drug abuse Sister   .  Drug abuse Brother   . Bipolar disorder Paternal Grandfather   . Bipolar disorder Cousin   . Liver disease Neg Hx     Social History Social History   Tobacco Use  . Smoking status: Current Every Day Smoker    Packs/day: 0.50    Years: 35.00    Pack years: 17.50    Types: Cigarettes  . Smokeless tobacco: Never Used  Substance Use Topics  . Alcohol use: No    Alcohol/week: 0.0 oz    Comment: 1 beer 4 days ago  . Drug use: No    Types: Cocaine    Comment: quit 09/2012     Allergies   Codeine; Morphine and related; and Reglan [metoclopramide]   Review of Systems Review of Systems ROS: Statement: All systems negative except as marked or noted in the HPI; Constitutional: Negative for fever and chills. ; ; Eyes: Negative for eye pain, redness and discharge. ; ; ENMT: Negative for ear pain, hoarseness, nasal congestion, sinus pressure and sore throat. ; ; Cardiovascular: Negative for chest pain, palpitations, diaphoresis, dyspnea and peripheral edema. ; ; Respiratory: Negative for cough, wheezing and stridor. ; ; Gastrointestinal: +N/V, abd pain. Negative for diarrhea, blood in stool, hematemesis, jaundice and rectal bleeding. . ; ; Genitourinary: Negative for dysuria, flank pain and hematuria. ; ; Musculoskeletal: Negative for back pain and neck pain. Negative for swelling and trauma.; ; Skin: Negative for pruritus, rash, abrasions, blisters, bruising and skin lesion.; ; Neuro: Negative for headache, lightheadedness and neck stiffness. Negative for weakness, altered level of consciousness, altered mental status, extremity weakness, paresthesias, involuntary movement, seizure and syncope.       Physical Exam Updated Vital Signs BP (!) 142/102 (BP Location: Left Arm)   Pulse 83   Temp 98.7 F (37.1 C) (Oral)   Resp 18   Ht 5\' 3"  (1.6 m)   Wt 60.3 kg (133 lb)   SpO2 100%   BMI 23.56 kg/m    19:22 Orthostatic Vital Signs AH  Orthostatic Lying   BP- Lying: 138/99Abnormal    Pulse- Lying: 90       Orthostatic Sitting  BP- Sitting: 140/106Abnormal    Pulse- Sitting: 98       Orthostatic Standing at 0 minutes  BP- Standing at 0 minutes: 133/101Abnormal    Pulse- Standing at 0 minutes: 103       Orthostatic Standing at 3 minutes  BP- Standing at 3 minutes: (pt didnt perform)      Physical Exam 1835: Physical examination:  Nursing notes reviewed; Vital signs and O2 SAT reviewed;  Constitutional: Well developed, Well nourished, Well hydrated, In no acute distress; Head:  Normocephalic, atraumatic; Eyes: EOMI, PERRL, No scleral icterus; ENMT: Mouth and pharynx normal, Mucous membranes moist; Neck: Supple, Full range of motion, No lymphadenopathy; Cardiovascular: Regular rate and rhythm, No gallop; Respiratory: Breath sounds clear & equal bilaterally, No wheezes.  Speaking full sentences with ease, Normal respiratory effort/excursion; Chest: Nontender, Movement normal; Abdomen: Soft, +mid-epigastric tenderness to palp. No rebound or guarding. Nondistended, Normal bowel sounds; Genitourinary: No CVA tenderness; Extremities: Peripheral pulses normal, No tenderness, No edema, No calf edema or asymmetry.; Neuro: AA&Ox3, Major CN grossly intact.  Speech clear. No gross focal motor or sensory deficits in extremities.; Skin: Color normal, Warm, Dry.   ED Treatments / Results  Labs (all labs ordered are listed, but only abnormal results are displayed)   EKG EKG Interpretation  Date/Time:  Wednesday July 02 2017 18:06:55 EDT Ventricular Rate:  84 PR Interval:    QRS Duration: 79 QT Interval:  408 QTC Calculation: 483 R Axis:   64 Text Interpretation:  Sinus rhythm Abnormal R-wave progression, early transition When compared with ECG of 01/25/2017 Rate slower Confirmed by Francine Graven [phone]) on 07/02/2017 6:51:54 PM   Radiology   Procedures Procedures (including  critical care time)  Medications Ordered in ED Medications  sodium chloride 0.9 % bolus 1,000 mL (has no administration in time range)  promethazine (PHENERGAN) injection 12.5 mg (has no administration in time range)  famotidine (PEPCID) IVPB 20 mg premix (has no administration in time range)  HYDROmorphone (DILAUDID) tablet 2 mg (has no administration in time range)  gi cocktail (Maalox,Lidocaine,Donnatal) (has no administration in time range)     Initial Impression / Assessment and Plan / ED Course  I have reviewed the triage vital signs and the nursing notes.  Pertinent labs & imaging results that were available during my care of the patient were reviewed by me and considered in my medical decision making (see chart for details).  MDM Reviewed: previous chart, nursing note and vitals Reviewed previous: labs, ECG and x-ray Interpretation: labs, ECG and x-ray    Results for orders placed or performed during the hospital encounter of 07/02/17  Troponin I  Result Value Ref Range   Troponin I <0.03 <0.03 ng/mL  Comprehensive metabolic panel  Result Value Ref Range   Sodium 128 (L) 135 - 145 mmol/L   Potassium 3.4 (L) 3.5 - 5.1 mmol/L   Chloride 91 (L) 101 - 111 mmol/L   CO2 20 (L) 22 - 32 mmol/L   Glucose, Bld 90 65 - 99 mg/dL   BUN 5 (L) 6 - 20 mg/dL   Creatinine, Ser 0.58 0.44 - 1.00 mg/dL   Calcium 9.6 8.9 - 10.3 mg/dL   Total Protein 8.1 6.5 - 8.1 g/dL   Albumin 3.9 3.5 - 5.0 g/dL   AST 19 15 - 41 U/L   ALT 20 14 - 54 U/L   Alkaline Phosphatase 127 (H) 38 - 126 U/L   Total Bilirubin 1.3 (H) 0.3 - 1.2 mg/dL   GFR calc non Af Amer >60 >60 mL/min   GFR calc Af Amer >60 >60 mL/min   Anion gap 17 (H) 5 - 15  Lipase, blood  Result Value Ref Range   Lipase 18 11 - 51 U/L  CBC with Differential  Result Value Ref Range   WBC 7.8 4.0 - 10.5 K/uL   RBC 4.65 3.87 - 5.11 MIL/uL   Hemoglobin 12.3 12.0 - 15.0 g/dL   HCT 36.0 36.0 - 46.0 %   MCV 77.4 (L) 78.0 - 100.0 fL    MCH 26.5 26.0 - 34.0 pg   MCHC 34.2 30.0 - 36.0 g/dL   RDW 18.8 (H) 11.5 - 15.5 %   Platelets 475 (H) 150 - 400 K/uL   Neutrophils Relative % 61 %   Neutro Abs 4.7 1.7 - 7.7 K/uL   Lymphocytes Relative 32 %   Lymphs Abs 2.5 0.7 - 4.0 K/uL   Monocytes Relative 7 %   Monocytes Absolute 0.6 0.1 - 1.0 K/uL   Eosinophils Relative 0 %   Eosinophils Absolute 0.0 0.0 - 0.7 K/uL   Basophils Relative 0 %   Basophils Absolute 0.0 0.0 - 0.1 K/uL  Urinalysis, Routine w reflex microscopic  Result Value Ref Range   Color, Urine YELLOW YELLOW   APPearance CLEAR CLEAR   Specific Gravity, Urine 1.010 1.005 -  1.030   pH 6.0 5.0 - 8.0   Glucose, UA NEGATIVE NEGATIVE mg/dL   Hgb urine dipstick NEGATIVE NEGATIVE   Bilirubin Urine NEGATIVE NEGATIVE   Ketones, ur 20 (A) NEGATIVE mg/dL   Protein, ur NEGATIVE NEGATIVE mg/dL   Nitrite NEGATIVE NEGATIVE   Leukocytes, UA NEGATIVE NEGATIVE   Dg Abd Acute W/chest Result Date: 07/02/2017 CLINICAL DATA:  Chest pain and nausea EXAM: DG ABDOMEN ACUTE W/ 1V CHEST COMPARISON:  07/01/2017 CXR, CT [facility] 06/09/2017 FINDINGS: Top-normal cardiac silhouette size. No aortic aneurysm. Atelectasis and/or scarring in the right midlung. No pulmonary consolidation, effusion or pneumothorax. Gas present within small bowel loops with minimal distention but without definite bowel obstruction. Surgical clips in the right upper quadrant with chain sutures in both upper quadrants and left mid abdomen. Phleboliths are seen within the lower pelvis bilaterally. No nephrolithiasis. No free air, organomegaly nor suspicious osseous abnormalities. Mild lower lumbar facet arthropathy. IMPRESSION: Right mid lung atelectasis or scarring. Nonspecific, nonobstructed bowel gas pattern with scattered gas containing small bowel loops within the abdomen. Electronically Signed   By: Ashley Royalty M.D.   On: 07/02/2017 20:06    Results for GAYLE, MARTINEZ (MRN [id number]) as of 07/02/2017 21:20  Ref. Range  05/21/2017 05:34 06/09/2017 16:24 06/23/2017 15:59 07/01/2017 19:23 07/02/2017 18:37  Sodium Latest Ref Range: 135 - 145 mmol/L 139 128 (L) 136 130 (L) 128 (L)    2000:  Pt denies CP and c/o acute flair of her chronic abd pain. Pt continues to ask for narcotics and antiemetics despite being given her home med dose of dilaudid and IV phenergan (pt did not vomit after she took PO dilaudid). [place] PMP Database accessed: pt has filled 22 prescriptions, written by 10 providers at 2 different pharmacies; most recent rx include oxycodone 5mg  tabs, #45, filled on 06/18/2017, and dilaudid 2mg  tabs, #25, filled on 06/23/2017; pt also was receiving monthly tramadol rx up until 05/23/2017.    2215:  Pt has tol PO well while in the ED without N/V. No stooling while in the ED. Pt continues to bend her arm to stop the IVF flowing. Pt informed she was to receive 2L IVF and re-check her electrolytes, if she did not want this treatment (indicated by bending her arm so the IVF would not flow), she could be discharged from the ED. Pt straightened her arm and said she would keep her arm straight for IVF. Pt requesting "more" narcotic pain medication. Pt informed she would not be receiving any further narcotic medications while in the ED. Pt verb understanding. Plan to give IVF NS x2L, recheck I-stat chem for Na trend. Sign out to Dr. Sabra Heck.     Final Clinical Impressions(s) / ED Diagnoses   Final diagnoses:  None    ED Discharge Orders    None       Francine Graven, DO 07/02/17 2218

## 2017-07-02 NOTE — ED Triage Notes (Signed)
Patient has chest pain and nausea, given 325 mg aspirin and 0.4 mg Nitro sublingual, per EMS did not help pain made worse. Seen yesterday for chronic abdominal pain.

## 2017-07-03 ENCOUNTER — Ambulatory Visit (INDEPENDENT_AMBULATORY_CARE_PROVIDER_SITE_OTHER): Payer: Self-pay | Admitting: General Surgery

## 2017-07-03 ENCOUNTER — Ambulatory Visit: Payer: Self-pay | Admitting: General Surgery

## 2017-07-03 ENCOUNTER — Emergency Department (HOSPITAL_COMMUNITY)
Admission: EM | Admit: 2017-07-03 | Discharge: 2017-07-03 | Disposition: A | Discharge status: Home / Self Care | Payer: Medicare Other | Attending: Emergency Medicine | Admitting: Emergency Medicine

## 2017-07-03 ENCOUNTER — Encounter (HOSPITAL_COMMUNITY): Payer: Self-pay

## 2017-07-03 ENCOUNTER — Encounter: Payer: Self-pay | Admitting: General Surgery

## 2017-07-03 VITALS — BP 140/96 | HR 70 | Temp 97.5°F | Ht 63.0 in | Wt 127.0 lb

## 2017-07-03 DIAGNOSIS — R1010 Upper abdominal pain, unspecified: Secondary | ICD-10-CM | POA: Diagnosis not present

## 2017-07-03 DIAGNOSIS — R109 Unspecified abdominal pain: Secondary | ICD-10-CM | POA: Diagnosis not present

## 2017-07-03 DIAGNOSIS — R112 Nausea with vomiting, unspecified: Secondary | ICD-10-CM | POA: Diagnosis not present

## 2017-07-03 DIAGNOSIS — F17210 Nicotine dependence, cigarettes, uncomplicated: Secondary | ICD-10-CM | POA: Insufficient documentation

## 2017-07-03 DIAGNOSIS — Z79899 Other long term (current) drug therapy: Secondary | ICD-10-CM | POA: Diagnosis not present

## 2017-07-03 DIAGNOSIS — Z8673 Personal history of transient ischemic attack (TIA), and cerebral infarction without residual deficits: Secondary | ICD-10-CM | POA: Diagnosis not present

## 2017-07-03 DIAGNOSIS — I10 Essential (primary) hypertension: Secondary | ICD-10-CM | POA: Diagnosis not present

## 2017-07-03 DIAGNOSIS — Z09 Encounter for follow-up examination after completed treatment for conditions other than malignant neoplasm: Secondary | ICD-10-CM

## 2017-07-03 LAB — CBC WITH DIFFERENTIAL/PLATELET
Basophils Absolute: 0 10*3/uL (ref 0.0–0.1)
Basophils Relative: 0 %
EOS PCT: 0 %
Eosinophils Absolute: 0 10*3/uL (ref 0.0–0.7)
HEMATOCRIT: 34.2 % — AB (ref 36.0–46.0)
HEMOGLOBIN: 11.7 g/dL — AB (ref 12.0–15.0)
Lymphocytes Relative: 31 %
Lymphs Abs: 2 10*3/uL (ref 0.7–4.0)
MCH: 27 pg (ref 26.0–34.0)
MCHC: 34.2 g/dL (ref 30.0–36.0)
MCV: 79 fL (ref 78.0–100.0)
MONO ABS: 0.6 10*3/uL (ref 0.1–1.0)
Monocytes Relative: 9 %
Neutro Abs: 3.9 10*3/uL (ref 1.7–7.7)
Neutrophils Relative %: 60 %
Platelets: 411 10*3/uL — ABNORMAL HIGH (ref 150–400)
RBC: 4.33 MIL/uL (ref 3.87–5.11)
RDW: 18.8 % — ABNORMAL HIGH (ref 11.5–15.5)
WBC: 6.5 10*3/uL (ref 4.0–10.5)

## 2017-07-03 LAB — COMPREHENSIVE METABOLIC PANEL
ALT: 17 U/L (ref 14–54)
AST: 32 U/L (ref 15–41)
Albumin: 3.8 g/dL (ref 3.5–5.0)
Alkaline Phosphatase: 115 U/L (ref 38–126)
Anion gap: 9 (ref 5–15)
BILIRUBIN TOTAL: 1.8 mg/dL — AB (ref 0.3–1.2)
BUN: 7 mg/dL (ref 6–20)
CO2: 21 mmol/L — ABNORMAL LOW (ref 22–32)
CREATININE: 0.49 mg/dL (ref 0.44–1.00)
Calcium: 9.1 mg/dL (ref 8.9–10.3)
Chloride: 107 mmol/L (ref 101–111)
GFR calc Af Amer: >60 mL/min (ref >60)
Glucose, Bld: 101 mg/dL — ABNORMAL HIGH (ref 65–99)
Potassium: 4.1 mmol/L (ref 3.5–5.1)
Sodium: 137 mmol/L (ref 135–145)
TOTAL PROTEIN: 8.2 g/dL — AB (ref 6.5–8.1)

## 2017-07-03 LAB — URINALYSIS, ROUTINE W REFLEX MICROSCOPIC
BACTERIA UA: NONE SEEN
BILIRUBIN URINE: NEGATIVE
Glucose, UA: NEGATIVE mg/dL
Hgb urine dipstick: NEGATIVE
Ketones, ur: 20 mg/dL — AB
Leukocytes, UA: NEGATIVE
Nitrite: NEGATIVE
PROTEIN: NEGATIVE mg/dL
RBC / HPF: NONE SEEN RBC/hpf (ref 0–5)
Specific Gravity, Urine: 1.01 (ref 1.005–1.030)
pH: 8 (ref 5.0–8.0)

## 2017-07-03 MED ORDER — LACTATED RINGERS IV BOLUS
1000.0000 mL | Freq: Once | INTRAVENOUS | Status: AC
  Administered 2017-07-03: 1000 mL via INTRAVENOUS

## 2017-07-03 MED ORDER — PROMETHAZINE HCL 25 MG PO TABS: 25.0000 mg | ORAL_TABLET | Freq: Three times a day (TID) | ORAL | 0 refills | Status: DC | PRN

## 2017-07-03 MED ORDER — DIPHENHYDRAMINE HCL 25 MG PO CAPS
25.0000 mg | ORAL_CAPSULE | Freq: Once | ORAL | Status: AC
  Administered 2017-07-03: 25 mg via ORAL
  Filled 2017-07-03: qty 1

## 2017-07-03 MED ORDER — ONDANSETRON HCL 4 MG/2ML IJ SOLN
4.0000 mg | Freq: Once | INTRAMUSCULAR | Status: AC
  Administered 2017-07-03: 4 mg via INTRAVENOUS
  Filled 2017-07-03: qty 2

## 2017-07-03 MED ORDER — PROMETHAZINE HCL 25 MG/ML IJ SOLN
12.5000 mg | Freq: Once | INTRAMUSCULAR | Status: AC
  Administered 2017-07-03: 12.5 mg via INTRAVENOUS
  Filled 2017-07-03: qty 1

## 2017-07-03 NOTE — ED Provider Notes (Signed)
[facility] Price DEPT Provider Note   CSN: [id number] Arrival date & time: 07/03/17  [name]     History   Chief Complaint Chief Complaint  Patient presents with  . Abdominal Pain    HPI Angel Price is a 52 y.o. female.  HPI Patient was upper abdominal pain.  Has had for the last 2 days.  Been seen in the ER twice and by her general surgeon.  History of chronic abdominal pain.  States the pain is severe.  States she has rectal Phenergan but cannot take it because she had recent hemorrhoid surgery.  Has been on chronic pain medicines including oral Dilaudid.  States she ran out of that 2 days ago.  States that is unrelated to the nausea and vomiting since she was vomiting before she ran out.  No diarrhea.  No fevers.  States she cannot eat or drink anything.  Seen at general surgeon's office today and both yesterday and today general surgery have stated she will be getting no more opiate prescriptions from their office. Past Medical History:  Diagnosis Date  . Abscess    soft tissue  . Adrenal mass ([place])   . Alcohol abuse   . Anxiety   . Blood transfusion without reported diagnosis   . Chronic abdominal pain   . Chronic wound infection of abdomen   . Colon polyp    colonoscopy 04/2014  . Depression   . Diverticulosis    colonoscopy 04/2014 moderat pan colonic  . Drug-seeking behavior   . Gastritis    EGD 05/2014  . Gastroparesis Nov 2015  . GERD (gastroesophageal reflux disease)   . Hemorrhoid    internal large  . Hiatal hernia   . History of Billroth II operation   . Hypertension   . Lung nodule    CT 02/2014 needs repeat 1 month  . Lung nodule < 6cm on CT 04/25/2014  . Lupus ([place])   . Malingering   . Nausea and vomiting    chronic, recurrent  . Pancreatitis   . Schatzki's ring    patent per EGD 04/2014  . Sickle cell trait ([place])   . Suicide attempt ([place])   . Thyroid disease 2000   overactive, radiation    Patient Active Problem List     Diagnosis Date Noted  . Hemorrhoidal skin tag   . SBO (small bowel obstruction) ([place])   . Non-intractable vomiting with nausea   . Folate deficiency 05/11/2017  . Abdominal wall fistula   . Cellulitis of abdominal wall 01/02/2017  . Acute left hemiparesis ([place]) 12/05/2016  . Malingering 12/05/2016  . Weakness of left lower extremity 12/02/2016  . CVA (cerebral vascular accident) ([place]) 11/23/2016  . Bright red rectal bleeding 08/22/2016  . Hypotension due to blood loss   . Acute GI bleeding 05/24/2016  . Dieulafoy lesion of duodenum   . History of Billroth II operation   . Gastrointestinal hemorrhage 05/22/2016  . Absolute anemia   . Acute pancreatitis 10/01/2015  . Abnormal CT scan of lung 10/01/2015  . Alcohol intoxication ([place])   . Left-sided weakness   . Psychosomatic factor in physical condition   . Upper GI bleed   . Diverticulosis of colon with hemorrhage   . Chronic wound infection of abdomen 12/22/2014  . Thyroid disease 12/22/2014  . HTN (hypertension) 12/22/2014  . Ataxia 11/01/2014  . Hemorrhoids 04/26/2014  . Lung nodule 04/26/2014  . Diverticulosis   . Gastritis   . Hiatal  hernia   . Schatzki's ring   . Acute blood loss anemia 04/25/2014  . Sinus tachycardia 04/25/2014  . Hypokalemia 04/25/2014  . Hematemesis with nausea 04/25/2014  . Lung nodule < 6cm on CT 04/25/2014  . Intractable nausea and vomiting 04/24/2014  . Gastroparesis   . Abdominal pain 04/01/2014  . Chronic abdominal pain 01/21/2014  . Gastroenteritis 12/10/2013  . Chronic abdominal wound infection 08/16/2013  . MDD (major depressive disorder), recurrent episode, severe ([place]) 06/27/2013  . Wrist laceration 06/24/2013  . Diarrhea 05/07/2013  . Rectal bleeding 05/07/2013  . Abnormal LFTs 05/07/2013  . Adrenal mass, left ([place]) 05/07/2013  . Abdominal wall abscess at site of surgical wound 04/19/2013  . Abdominal wall abscess 04/18/2013  . Frequent headaches 03/30/2013  . Sleep  difficulties 03/30/2013  . Essential hypertension, benign 03/30/2013  . History of cocaine abuse 03/16/2013  . History of schizoaffective disorder 03/16/2013  . Bipolar disorder ([place]) 03/16/2013  . Personality disorder ([place]) 03/16/2013  . Tobacco abuse 03/16/2013  . Alcohol abuse 03/16/2013  . Palpitations 03/16/2013  . Poor vision 03/16/2013  . History of gastric bypass 03/16/2013  . Status post hysterectomy with oophorectomy 03/16/2013  . Hypothyroid 03/16/2013  . Lupus ([place]) 03/16/2013    Past Surgical History:  Procedure Laterality Date  . ABDOMINAL HYSTERECTOMY  2013   [place]  . ABDOMINAL SURGERY    . ADRENALECTOMY Right   . AGILE CAPSULE N/A 01/05/2015   Procedure: AGILE CAPSULE;  Surgeon: Daneil Dolin, MD;  Location: [facility];  Service: Endoscopy;  Laterality: N/A;  0700  . Billroth II procedure      [place], first 2000, 2005/2006.  Marland Kitchen BIOPSY  05/20/2013   Procedure: BIOPSIES OF ASCENDING AND SIGMOID COLON;  Surgeon: Daneil Dolin, MD;  Location: [facility];  Service: Endoscopy;;  . BIOPSY  04/26/2014   Procedure: BIOPSIES;  Surgeon: Danie Binder, MD;  Location: [facility];  Service: Endoscopy;;  . CHOLECYSTECTOMY    . COLONOSCOPY     in [place]  . COLONOSCOPY WITH PROPOFOL N/A 05/20/2013   Dr.Rourk- inadequate prep, normal appearing rectum, grossly normal colon aside from pancolonic diverticula, normal terminal ileum bx= unremarkable colonic mucosa. Due for early interval 2016.   Marland Kitchen COLONOSCOPY WITH PROPOFOL N/A 04/26/2014   OVZ:CHYIFO ileum/one colon polyp removed/moderate pan-colonic diverticulosis/large internal hemorrhoids  . COLONOSCOPY WITH PROPOFOL N/A 12/23/2014   Dr.Rourk- minimal internal hemorrhoids, pancolonic diverticulosis  . COLONOSCOPY WITH PROPOFOL N/A 08/23/2016   Procedure: COLONOSCOPY WITH PROPOFOL;  Surgeon: Danie Binder, MD;  [facility];  Service: Endoscopy;  Laterality: N/A;  . DEBRIDEMENT OF ABDOMINAL WALL ABSCESS N/A 02/08/2013    Procedure: DEBRIDEMENT OF ABDOMINAL WALL ABSCESS;  Surgeon: Jamesetta So, MD;  Location: [facility];  Service: General;  Laterality: N/A;  . ESOPHAGOGASTRODUODENOSCOPY (EGD) WITH PROPOFOL N/A 05/20/2013   Dr.Rourk- s/p prior gastric surgery with normal esophagus, residual gastric mucosa and patent efferent limb  . ESOPHAGOGASTRODUODENOSCOPY (EGD) WITH PROPOFOL N/A 02/03/2014   Dr. Gala Romney:  s/p hemigastrectomy with retained gastric contents. Residual gastric mucosa and efferent limb appeared normal otherwise. Query gastroparesis.   Marland Kitchen ESOPHAGOGASTRODUODENOSCOPY (EGD) WITH PROPOFOL N/A 04/26/2014   YDX:AJOINOMV'E ring/small HH/mild non-erosive gasrtitis/normal anastomosis  . ESOPHAGOGASTRODUODENOSCOPY (EGD) WITH PROPOFOL N/A 12/23/2014   Dr.Rourk- s/p prior hemigastrctomy, active oozing from anastomotic suture site, hemostasis achieved  . ESOPHAGOGASTRODUODENOSCOPY (EGD) WITH PROPOFOL N/A 05/23/2016   Dr. Oneida Alar while inpatient: red blood at anastomosis, s/p epi injection and clips, likely secondary to  Dieulafoy's lesion at anastomosis   . ESOPHAGOGASTRODUODENOSCOPY (EGD) WITH PROPOFOL N/A 05/11/2017   Procedure: ESOPHAGOGASTRODUODENOSCOPY (EGD) WITH PROPOFOL;  Surgeon: Daneil Dolin, MD;  [facility];  Service: Endoscopy;  Laterality: N/A;  . HEMORRHOID SURGERY N/A 06/18/2017   Procedure: THREE COLUMN EXTENSIVE HEMORRHOIDECTOMY;  Surgeon: Virl Cagey, MD;  Location: [facility];  Service: General;  Laterality: N/A;  . INCISION AND DRAINAGE ABSCESS N/A 01/06/2017   Procedure: INCISION AND DRAINAGE ABDOMINAL WALL ABSCESS;  Surgeon: Aviva Signs, MD;  Location: [facility];  Service: General;  Laterality: N/A;  . tendon repar Right    wrist  . WOUND EXPLORATION Right 06/24/2013   Procedure: exploration of traumatic wound right wrist;  Surgeon: Tennis Must, MD;  Location: [facility];  Service: Orthopedics;  Laterality: Right;     OB History    Gravida  4   Para  4   Term  4   Preterm      AB        Living  3     SAB      TAB      Ectopic      Multiple      Live Births               Home Medications    Prior to Admission medications   Medication Sig Start Date End Date Taking? Authorizing Provider  amLODipine (NORVASC) 10 MG tablet Take 0.5 tablets (5 mg total) by mouth daily. 01/03/17  Yes Patrecia Pour, Christean Grief, MD  atenolol (TENORMIN) 50 MG tablet Take 50 mg by mouth daily.   Yes [provider]  docusate sodium (COLACE) 100 MG capsule Take 1 capsule (100 mg total) by mouth 2 (two) times daily. 06/18/17 06/18/18 Yes Virl Cagey, MD  escitalopram (LEXAPRO) 20 MG tablet Take 20 mg daily by mouth.   Yes [provider]  HYDROmorphone (DILAUDID) 2 MG tablet Take 1 tablet (2 mg total) by mouth every 6 (six) hours as needed for severe pain. 06/23/17 06/23/18 Yes Aviva Signs, MD  lisinopril (PRINIVIL,ZESTRIL) 20 MG tablet Take 1 tablet (20 mg total) by mouth daily. 11/26/16 11/26/17 Yes Reyne Dumas, MD  loperamide (IMODIUM) 2 MG capsule Take 1 capsule (2 mg total) by mouth 4 (four) times daily as needed for diarrhea or loose stools. 07/01/17  Yes Noemi Chapel, MD  ondansetron (ZOFRAN ODT) 4 MG disintegrating tablet Take 1 tablet (4 mg total) by mouth every 8 (eight) hours as needed for nausea. 07/01/17  Yes Noemi Chapel, MD  pantoprazole (PROTONIX) 40 MG tablet Take 1 tablet (40 mg total) by mouth 2 (two) times daily. 05/23/17 07/03/17 Yes Johnson, Clanford L, MD  promethazine (PHENERGAN) 25 MG suppository Place 1 suppository (25 mg total) rectally every 6 (six) hours as needed for nausea or vomiting. 07/02/17  Yes Francine Graven, DO  hydrocortisone (ANUSOL-HC) 2.5 % rectal cream Place rectally 2 (two) times daily. Patient not taking: Reported on 07/03/2017 05/23/17   Murlean Iba, MD  promethazine (PHENERGAN) 25 MG tablet Take 1 tablet (25 mg total) by mouth every 8 (eight) hours as needed for nausea. 07/03/17   Davonna Belling, MD    Family  History Family History  Problem Relation Age of Onset  . Brain cancer Son   . Schizophrenia Son   . Cancer Son        brain  . Lung cancer Father   . Cancer Father  mets  . Drug abuse Mother   . Breast cancer Maternal Aunt   . Bipolar disorder Maternal Aunt   . Drug abuse Maternal Aunt   . Colon cancer Maternal Grandmother        late 38s, early 44s  . Drug abuse Sister   . Drug abuse Brother   . Bipolar disorder Paternal Grandfather   . Bipolar disorder Cousin   . Liver disease Neg Hx     Social History Social History   Tobacco Use  . Smoking status: Current Every Day Smoker    Packs/day: 0.50    Years: 35.00    Pack years: 17.50    Types: Cigarettes  . Smokeless tobacco: Never Used  Substance Use Topics  . Alcohol use: No    Alcohol/week: 0.0 oz    Comment: 1 beer 4 days ago  . Drug use: No    Types: Cocaine    Comment: quit 09/2012     Allergies   Codeine; Morphine and related; and Reglan [metoclopramide]   Review of Systems Review of Systems  Constitutional: Negative for appetite change and fever.  HENT: Negative for congestion.   Respiratory: Negative for shortness of breath.   Cardiovascular: Negative for chest pain.  Gastrointestinal: Positive for abdominal pain, nausea, rectal pain and vomiting. Negative for diarrhea.  Endocrine: Negative for polyuria.  Genitourinary: Negative for flank pain.  Musculoskeletal: Negative for back pain.  Skin: Positive for wound.  Neurological: Negative for weakness.  Hematological: Negative for adenopathy.  Psychiatric/Behavioral: Negative for confusion.     Physical Exam Updated Vital Signs BP (!) 140/96 (BP Location: Right Arm)   Pulse 80   Temp 98.2 F (36.8 C) (Oral)   Resp 18   SpO2 100%   Physical Exam  Constitutional: She appears well-developed.  HENT:  Head: Normocephalic.  Eyes: EOM are normal.  Cardiovascular: Regular rhythm.  Pulmonary/Chest: Breath sounds normal.  Abdominal: Normal  appearance.  Upper abdominal tenderness without rebound or guarding.  Dressing over lower abdomen from chronic abdominal wound.  Skin: Skin is warm. Capillary refill takes less than 2 seconds.     ED Treatments / Results  Labs (all labs ordered are listed, but only abnormal results are displayed) Labs Reviewed  COMPREHENSIVE METABOLIC PANEL - Abnormal; Notable for the following components:      Result Value   CO2 21 (*)    Glucose, Bld 101 (*)    Total Protein 8.2 (*)    Total Bilirubin 1.8 (*)    All other components within normal limits  CBC WITH DIFFERENTIAL/PLATELET - Abnormal; Notable for the following components:   Hemoglobin 11.7 (*)    HCT 34.2 (*)    RDW 18.8 (*)    Platelets 411 (*)    All other components within normal limits  URINALYSIS, ROUTINE W REFLEX MICROSCOPIC - Abnormal; Notable for the following components:   Ketones, ur 20 (*)    Squamous Epithelial / LPF 0-5 (*)    All other components within normal limits    EKG None  Radiology Dg Abd Acute W/chest  Result Date: 07/02/2017 CLINICAL DATA:  Chest pain and nausea EXAM: DG ABDOMEN ACUTE W/ 1V CHEST COMPARISON:  07/01/2017 CXR, CT [facility] 06/09/2017 FINDINGS: Top-normal cardiac silhouette size. No aortic aneurysm. Atelectasis and/or scarring in the right midlung. No pulmonary consolidation, effusion or pneumothorax. Gas present within small bowel loops with minimal distention but without definite bowel obstruction. Surgical clips in the right upper quadrant with chain sutures in  both upper quadrants and left mid abdomen. Phleboliths are seen within the lower pelvis bilaterally. No nephrolithiasis. No free air, organomegaly nor suspicious osseous abnormalities. Mild lower lumbar facet arthropathy. IMPRESSION: Right mid lung atelectasis or scarring. Nonspecific, nonobstructed bowel gas pattern with scattered gas containing small bowel loops within the abdomen. Electronically Signed   By: Ashley Royalty M.D.   On: 07/02/2017  20:06    Procedures Procedures (including critical care time)  Medications Ordered in ED Medications  lactated ringers bolus 1,000 mL (0 mLs Intravenous Stopped 07/03/17 2106)  promethazine (PHENERGAN) injection 12.5 mg (12.5 mg Intravenous Given 07/03/17 1816)  diphenhydrAMINE (BENADRYL) capsule 25 mg (25 mg Oral Given 07/03/17 2107)  ondansetron (ZOFRAN) injection 4 mg (4 mg Intravenous Given 07/03/17 2213)     Initial Impression / Assessment and Plan / ED Course  I have reviewed the triage vital signs and the nursing notes.  Pertinent labs & imaging results that were available during my care of the patient were reviewed by me and considered in my medical decision making (see chart for details).     Patient with acute on chronic abdominal pain.  Nausea vomiting.  Tolerate orals here.  Labs improved from yesterday.  Previous visits for same.  Surgery recently cut her off from her pain medicines.  Will discharge home.  Doubt acute intra-abdominal pathology.  Final Clinical Impressions(s) / ED Diagnoses   Final diagnoses:  Non-intractable vomiting with nausea, unspecified vomiting type  Pain of upper abdomen    ED Discharge Orders        Ordered    promethazine (PHENERGAN) 25 MG tablet  Every 8 hours PRN     07/03/17 2229       Davonna Belling, MD 07/03/17 2256

## 2017-07-03 NOTE — ED Notes (Signed)
Unable to obtain blood specimen will attempt  After pt is more hydrated.

## 2017-07-03 NOTE — ED Notes (Signed)
Pt used the bathroom right when she got here and a sample was not collected..Will check back.

## 2017-07-03 NOTE — ED Notes (Signed)
Bed: WA01 Expected date:  Expected time:  Means of arrival:  Comments: EMS 

## 2017-07-03 NOTE — Progress Notes (Signed)
Subjective:     Angel Price  Status post extensive hemorrhoidectomy.  Patient states she is having 7 out of 10 rectal pain.  She denies any active bleeding.  She denies being constipated.  She has been trying to get narcotics from [place] recently. Objective:    BP (!) 140/96   Pulse 70   Temp (!) 97.5 F (36.4 C)   Ht 5\' 3"  (1.6 m)   Wt 127 lb (57.6 kg)   BMI 22.50 kg/m   General:   Anxious  Rectal examination reveals a well healing surgical site.  No active bleeding is noted.  No purulent drainage is noted.     Assessment:    Rectal pain, multifactorial in nature.  Patient has a history of chronic narcotic use    Plan:  I told the patient that she does not need more narcotics.  Rectal care lidocaine cream has been given to her for control of the rectal pain.  She should use medicated wipes.  She was told to pat herself dry.  Follow-up as needed.

## 2017-07-03 NOTE — ED Triage Notes (Signed)
Pt brought in by EMS c/o abdominal pain RUQ and LUQ. Pt report that she has had n/v and began on Tue, Pt was given 4mg  of Zofran IM en route.. Pt was seen at [facility] yesterday for similar symptoms. Pt has recent abdominal surgery per EMS pt has bandage on abdomen.  ' EMS v/s BP 151/101 Hr 78 RR 20, Temp 98.1

## 2017-07-04 LAB — URINE CULTURE

## 2017-07-14 ENCOUNTER — Ambulatory Visit: Payer: Medicare Other | Admitting: Nurse Practitioner

## 2017-07-14 ENCOUNTER — Ambulatory Visit: Payer: Medicare Other | Admitting: Gastroenterology

## 2017-07-14 ENCOUNTER — Telehealth: Payer: Self-pay | Admitting: Nurse Practitioner

## 2017-07-14 ENCOUNTER — Encounter

## 2017-07-14 ENCOUNTER — Encounter: Payer: Self-pay | Admitting: Internal Medicine

## 2017-07-14 NOTE — Telephone Encounter (Signed)
PATIENT WAS A NO SHOW AND LETTER SENT  °

## 2017-07-14 NOTE — Progress Notes (Deleted)
Referring Provider: Rosita Fire, MD Primary Care Physician:  Rosita Fire, MD Primary GI:  Dr. Gala Romney  No chief complaint on file.   HPI:   Angel Price is a 52 y.o. female who presents for hospital follow-up.  The patient was admitted to the hospital from 05/09/2017 through 05/23/2017 for GI bleed.  Noted history of gastric bypass, chronic periumbilical fistulas, hypertension.  She complained of rectal bleeding on presentation.  Felt to be likely hemorrhoidal bleeding.  Continued nausea and vomiting and CT scan indicated small bowel obstruction.  Hemoglobin was relatively stable and 9.4 at discharge.  Recommend continue Anusol.  Small bowel obstruction followed by surgery for which an NG tube was placed.  This is discontinued on 05/20/2017 due to improvement and she was started on a regular diet which she tolerated well.  Overall improvement of intermittent nausea and vomiting.  Discharged home on Protonix twice daily.  Noted normocytic anemia secondary to iron deficiency versus acute blood loss.  Recommended GI follow-up as an outpatient.  While inpatient, an EGD was completed 05/11/2017 which found normal esophagus, surgically altered stomach, previously placed hemostasis clips persistent, small hiatal hernia.  No bleeding lesion found.  Suspected lower GI tract etiology either hemorrhoids or diverticulosis.  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  On 06/18/2017 she underwent 3 column extensive hemorrhoidectomy by surgery.  She presented to the emergency room 5 days after surgery complaining of rectal pain and rectal bleeding.  Hemoglobin was low but stable at 10.6.  She was examined by surgeon at the bedside and felt bleeding was manageable at home and she could be discharged.  She was given a prescription for Dilaudid orally.  Today she states   Past Medical History:  Diagnosis Date    . Abscess    soft tissue  . Adrenal mass ([place])   . Alcohol abuse   . Anxiety   . Blood transfusion without reported diagnosis   . Chronic abdominal pain   . Chronic wound infection of abdomen   . Colon polyp    colonoscopy 04/2014  . Depression   . Diverticulosis    colonoscopy 04/2014 moderat pan colonic  . Drug-seeking behavior   . Gastritis    EGD 05/2014  . Gastroparesis Nov 2015  . GERD (gastroesophageal reflux disease)   . Hemorrhoid    internal large  . Hiatal hernia   . History of Billroth II operation   . Hypertension   . Lung nodule    CT 02/2014 needs repeat 1 month  . Lung nodule < 6cm on CT 04/25/2014  . Lupus ([place])   . Malingering   . Nausea and vomiting    chronic, recurrent  . Pancreatitis   . Schatzki's ring    patent per EGD 04/2014  . Sickle cell trait ([place])   . Suicide attempt ([place])   . 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 ABDOMINAL HYSTERECTOMY  2013   [place]  . ABDOMINAL SURGERY    . ADRENALECTOMY Right   . AGILE CAPSULE N/A 01/05/2015   Procedure: AGILE CAPSULE;  Surgeon: Daneil Dolin, MD;  [facility];  Service: Endoscopy;  Laterality: N/A;  0700  . Billroth II procedure      [place], first 2000, 2005/2006.  Marland Kitchen BIOPSY  05/20/2013   Procedure: BIOPSIES OF ASCENDING  AND SIGMOID COLON;  Surgeon: Daneil Dolin, MD;  Location: [facility];  Service: Endoscopy;;  . BIOPSY  04/26/2014   Procedure: BIOPSIES;  Surgeon: Danie Binder, MD;  Location: [facility];  Service: Endoscopy;;  . CHOLECYSTECTOMY    . COLONOSCOPY     in [place]  . COLONOSCOPY WITH PROPOFOL N/A 05/20/2013   Dr.Rourk- inadequate prep, normal appearing rectum, grossly normal colon aside from pancolonic diverticula, normal terminal ileum bx= unremarkable colonic mucosa. Due for early interval 2016.   Marland Kitchen COLONOSCOPY WITH PROPOFOL N/A 04/26/2014   [facility]:ERDEYC ileum/one colon polyp removed/moderate pan-colonic  diverticulosis/large internal hemorrhoids  . COLONOSCOPY WITH PROPOFOL N/A 12/23/2014   Dr.Rourk- minimal internal hemorrhoids, pancolonic diverticulosis  . COLONOSCOPY WITH PROPOFOL N/A 08/23/2016   Procedure: COLONOSCOPY WITH PROPOFOL;  Surgeon: Danie Binder, MD;  [facility];  Service: Endoscopy;  Laterality: N/A;  . DEBRIDEMENT OF ABDOMINAL WALL ABSCESS N/A 02/08/2013   Procedure: DEBRIDEMENT OF ABDOMINAL WALL ABSCESS;  Surgeon: Jamesetta So, MD;  Location: [facility];  Service: General;  Laterality: N/A;  . ESOPHAGOGASTRODUODENOSCOPY (EGD) WITH PROPOFOL N/A 05/20/2013   Dr.Rourk- s/p prior gastric surgery with normal esophagus, residual gastric mucosa and patent efferent limb  . ESOPHAGOGASTRODUODENOSCOPY (EGD) WITH PROPOFOL N/A 02/03/2014   Dr. Gala Romney:  s/p hemigastrectomy with retained gastric contents. Residual gastric mucosa and efferent limb appeared normal otherwise. Query gastroparesis.   Marland Kitchen ESOPHAGOGASTRODUODENOSCOPY (EGD) WITH PROPOFOL N/A 04/26/2014   XKG:YJEHUDJS'H ring/small HH/mild non-erosive gasrtitis/normal anastomosis  . ESOPHAGOGASTRODUODENOSCOPY (EGD) WITH PROPOFOL N/A 12/23/2014   Dr.Rourk- s/p prior hemigastrctomy, active oozing from anastomotic suture site, hemostasis achieved  . ESOPHAGOGASTRODUODENOSCOPY (EGD) WITH PROPOFOL N/A 05/23/2016   Dr. 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 ESOPHAGOGASTRODUODENOSCOPY (EGD) WITH PROPOFOL N/A 05/11/2017   Procedure: ESOPHAGOGASTRODUODENOSCOPY (EGD) WITH PROPOFOL;  Surgeon: Daneil Dolin, MD;  [facility];  Service: Endoscopy;  Laterality: N/A;  . HEMORRHOID SURGERY N/A 06/18/2017   Procedure: THREE COLUMN EXTENSIVE HEMORRHOIDECTOMY;  Surgeon: Virl Cagey, MD;  Location: [facility];  Service: General;  Laterality: N/A;  . INCISION AND DRAINAGE ABSCESS N/A 01/06/2017   Procedure: INCISION AND DRAINAGE ABDOMINAL WALL  ABSCESS;  Surgeon: Aviva Signs, MD;  Location: [facility];  Service: General;  Laterality: N/A;  . tendon repar Right    wrist  . WOUND EXPLORATION Right 06/24/2013   Procedure: exploration of traumatic wound right wrist;  Surgeon: Tennis Must, MD;  Location: [facility];  Service: Orthopedics;  Laterality: Right;    Current Outpatient Medications  Medication Sig Dispense Refill  . amLODipine (NORVASC) 10 MG tablet Take 0.5 tablets (5 mg total) by mouth daily. 30 tablet 0  . atenolol (TENORMIN) 50 MG tablet Take 50 mg by mouth daily.    Marland Kitchen docusate sodium (COLACE) 100 MG capsule Take 1 capsule (100 mg total) by mouth 2 (two) times daily. 60 capsule 2  . escitalopram (LEXAPRO) 20 MG tablet Take 20 mg daily by mouth.    . hydrocortisone (ANUSOL-HC) 2.5 % rectal cream Place rectally 2 (two) times daily. (Patient not taking: Reported on 07/03/2017) 30 g 1  . HYDROmorphone (DILAUDID) 2 MG tablet Take 1 tablet (2 mg total) by mouth every 6 (six) hours as needed for severe pain. 25 tablet 0  . lisinopril (PRINIVIL,ZESTRIL) 20 MG tablet Take 1 tablet (20 mg total) by mouth daily. 30 tablet 11  . loperamide (IMODIUM) 2  MG capsule Take 1 capsule (2 mg total) by mouth 4 (four) times daily as needed for diarrhea or loose stools. 12 capsule 0  . ondansetron (ZOFRAN ODT) 4 MG disintegrating tablet Take 1 tablet (4 mg total) by mouth every 8 (eight) hours as needed for nausea. 10 tablet 0  . pantoprazole (PROTONIX) 40 MG tablet Take 1 tablet (40 mg total) by mouth 2 (two) times daily. 60 tablet 0  . promethazine (PHENERGAN) 25 MG suppository Place 1 suppository (25 mg total) rectally every 6 (six) hours as needed for nausea or vomiting. 12 each 0  . promethazine (PHENERGAN) 25 MG tablet Take 1 tablet (25 mg total) by mouth every 8 (eight) hours as needed for nausea. 8 tablet 0   No current facility-administered medications for this visit.     Allergies as of 07/14/2017 - Review Complete 07/03/2017  Allergen  Reaction Noted  . Codeine Hives 02/06/2013  . Morphine and related Hives 02/06/2013  . Reglan [metoclopramide] Other (See Comments) 08/08/2016    Family History  Problem Relation Age of Onset  . Brain cancer Son   . Schizophrenia Son   . Cancer Son        brain  . Lung cancer Father   . Cancer Father        mets  . Drug abuse Mother   . Breast cancer Maternal Aunt   . Bipolar disorder Maternal Aunt   . Drug abuse Maternal Aunt   . Colon cancer Maternal Grandmother        late 41s, early 52s  . Drug abuse Sister   . Drug abuse Brother   . Bipolar disorder Paternal Grandfather   . Bipolar disorder Cousin   . Liver disease Neg Hx     Social History   Socioeconomic History  . Marital status: Legally Separated    Spouse name: Not on file  . Number of children: Not on file  . Years of education: Not on file  . Highest education level: Not on file  Occupational History  . Not on file  Social Needs  . Financial resource strain: Not on file  . Food insecurity:    Worry: Not on file    Inability: Not on file  . Transportation needs:    Medical: Not on file    Non-medical: Not on file  Tobacco Use  . Smoking status: Current Every Day Smoker    Packs/day: 0.50    Years: 35.00    Pack years: 17.50    Types: Cigarettes  . Smokeless tobacco: Never Used  Substance and Sexual Activity  . Alcohol use: No    Alcohol/week: 0.0 oz    Comment: 1 beer 4 days ago  . Drug use: No    Types: Cocaine    Comment: quit 09/2012  . Sexual activity: Yes    Birth control/protection: Surgical  Lifestyle  . Physical activity:    Days per week: Not on file    Minutes per session: Not on file  . Stress: Not on file  Relationships  . Social connections:    Talks on phone: Not on file    Gets together: Not on file    Attends religious service: Not on file    Active member of club or organization: Not on file    Attends meetings of clubs or organizations: Not on file    Relationship  status: Not on file  Other Topics Concern  . Not on file  Social History  Narrative  . Not on file    Review of Systems: General: Negative for anorexia, weight loss, fever, chills, fatigue, weakness. Eyes: Negative for vision changes.  ENT: Negative for hoarseness, difficulty swallowing , nasal congestion. CV: Negative for chest pain, angina, palpitations, dyspnea on exertion, peripheral edema.  Respiratory: Negative for dyspnea at rest, dyspnea on exertion, cough, sputum, wheezing.  GI: See history of present illness. GU:  Negative for dysuria, hematuria, urinary incontinence, urinary frequency, nocturnal urination.  MS: Negative for joint pain, low back pain.  Derm: Negative for rash or itching.  Neuro: Negative for weakness, abnormal sensation, seizure, frequent headaches, memory loss, confusion.  Psych: Negative for anxiety, depression, suicidal ideation, hallucinations.  Endo: Negative for unusual weight change.  Heme: Negative for bruising or bleeding. Allergy: Negative for rash or hives.   Physical Exam: There were no vitals taken for this visit. General:   Alert and oriented. Pleasant and cooperative. Well-nourished and well-developed.  Head:  Normocephalic and atraumatic. Eyes:  Without icterus, sclera clear and conjunctiva pink.  Ears:  Normal auditory acuity. Mouth:  No deformity or lesions, oral mucosa pink.  Throat/Neck:  Supple, without mass or thyromegaly. Cardiovascular:  S1, S2 present without murmurs appreciated. Normal pulses noted. Extremities without clubbing or edema. Respiratory:  Clear to auscultation bilaterally. No wheezes, rales, or rhonchi. No distress.  Gastrointestinal:  +BS, soft, non-tender and non-distended. No HSM noted. No guarding or rebound. No masses appreciated.  Rectal:  Deferred  Musculoskalatal:  Symmetrical without gross deformities. Normal posture. Skin:  Intact without significant lesions or rashes. Neurologic:  Alert and oriented x4;   grossly normal neurologically. Psych:  Alert and cooperative. Normal mood and affect. Heme/Lymph/Immune: No significant cervical adenopathy. No excessive bruising noted.    07/14/2017 9:45 AM   Disclaimer: This note was dictated with voice recognition software. Similar sounding words can inadvertently be transcribed and may not be corrected upon review.

## 2017-08-26 ENCOUNTER — Other Ambulatory Visit: Payer: Self-pay

## 2017-08-26 IMAGING — DX DG ABDOMEN ACUTE W/ 1V CHEST
3 series · 3 of 3 positions shown · non-contrast
Comparison: Chest x-ray dated 09/25/2015.

CLINICAL DATA: N/V, Right and left upper quadrant abd pain,
productive cough x 2 days. History of HTN, GERD, lupus, lung nodule,
hemorrhoid, diverticulosis, gastritis, hiatal hernia, debridement
abd wall abscess, cholecystectomy, hysterectomy.

EXAM:
DG ABDOMEN ACUTE W/ 1V CHEST

[chest pa]
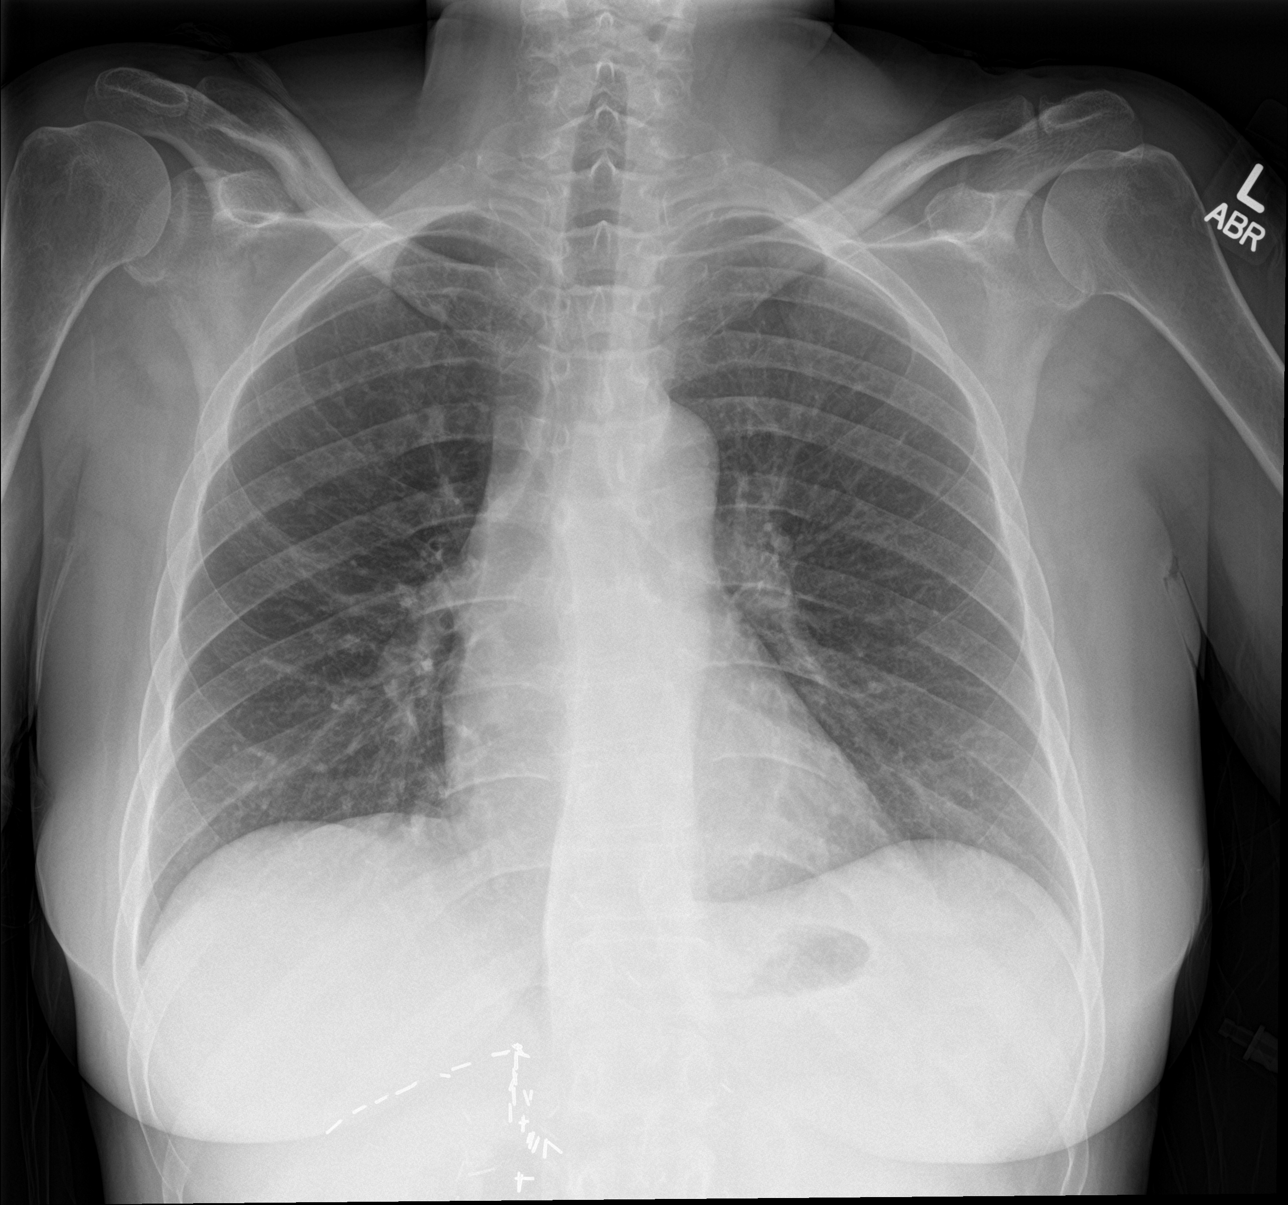

[abdomen erect]
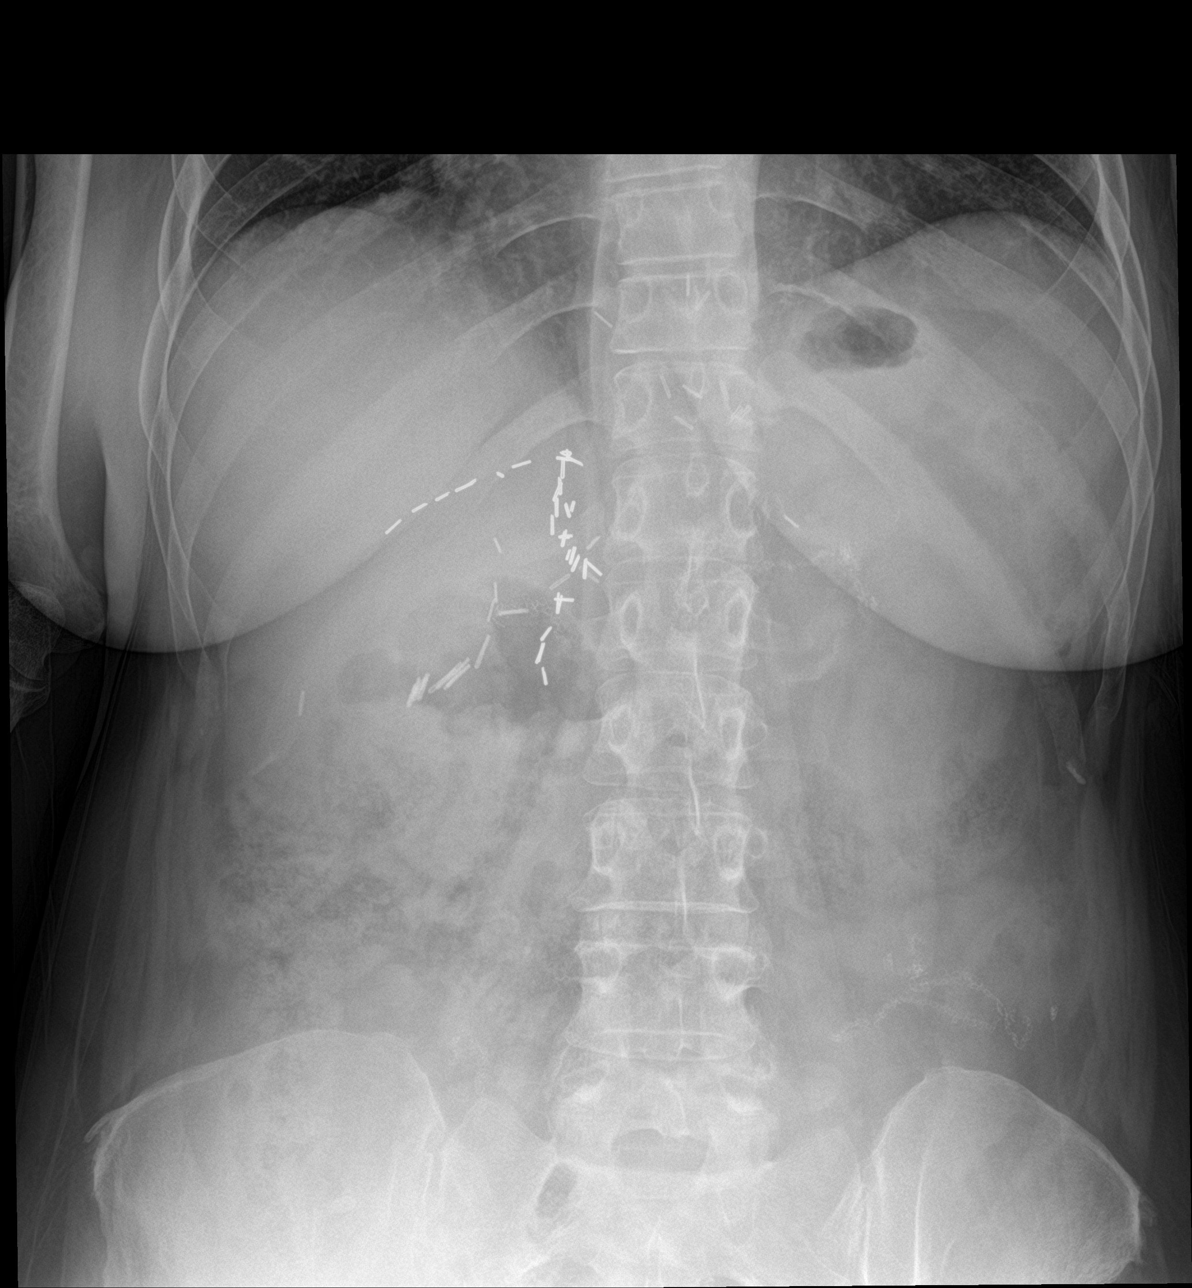

[abdomen supine]
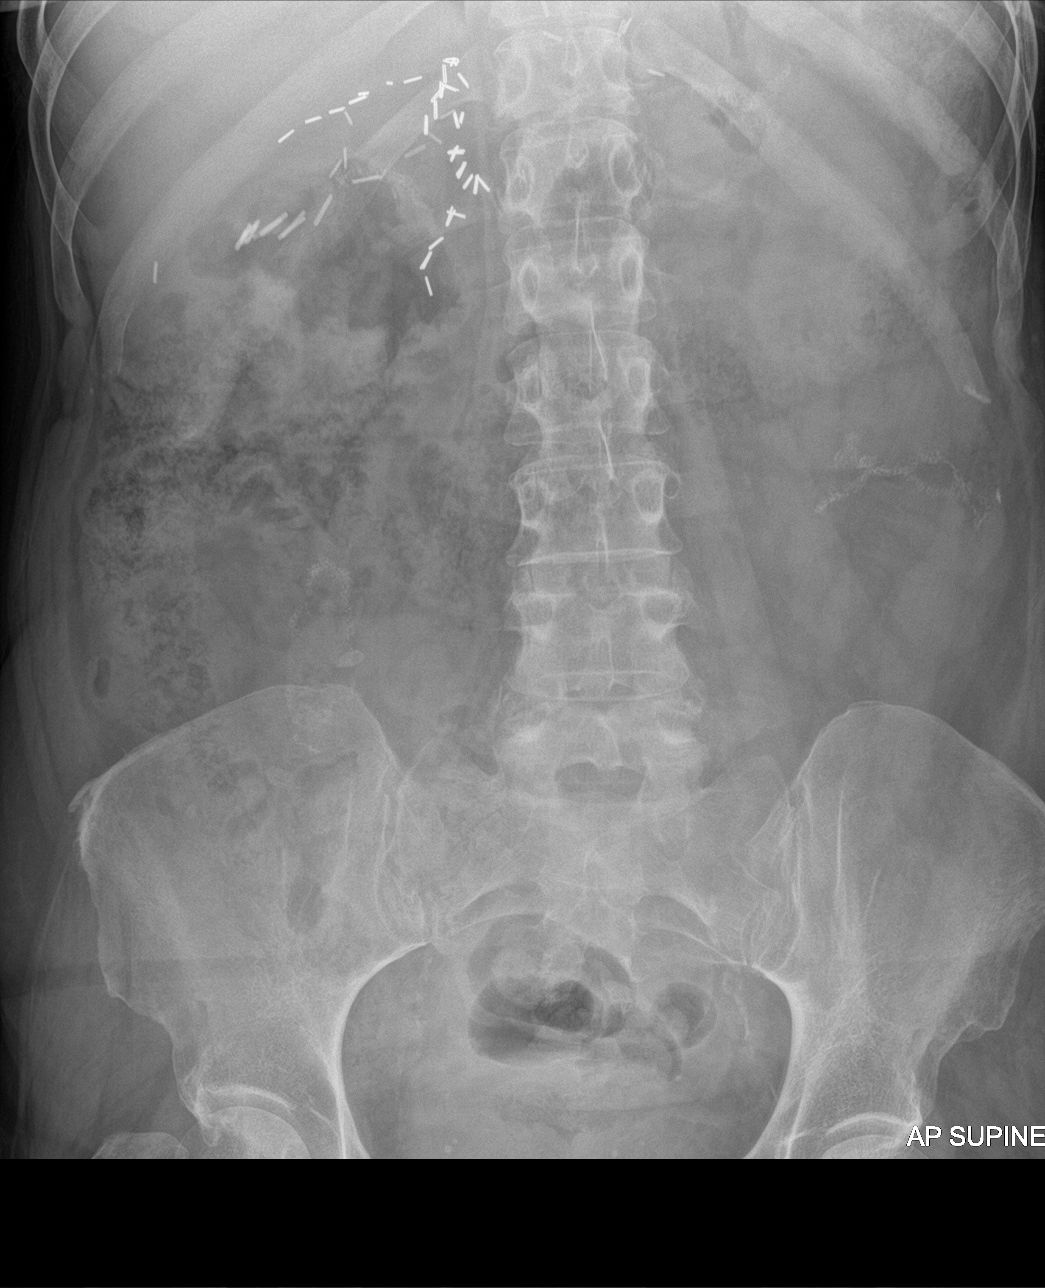

[3 of 3 positions shown; findings below may reference images not displayed]

FINDINGS: Single view of the chest: Heart size and mediastinal contours are
normal. Subtle small nodular densities again noted within the right
upper lung, stable, presumably corresponding to the chronic scarring
described on earlier CT report of 03/16/2015. No new lung findings.
No pleural effusion or pneumothorax seen. Osseous structures about
the chest are unremarkable.

Supine and upright views of the abdomen: Bowel gas pattern is
nonobstructive. Fairly large amount of stool noted within the
grossly nondistended colon. No evidence of soft tissue mass or
abnormal fluid collection. No evidence of free intraperitoneal air.
Surgical clips and sutures again noted. Osseous structures of the
abdomen and pelvis are unremarkable.
IMPRESSION: 1. No evidence of acute cardiopulmonary abnormality. No evidence of
pneumonia or pulmonary edema.
2. Nonobstructive bowel gas pattern. Fairly large amount of stool
within the colon (constipation? ).

## 2017-08-26 NOTE — Patient Outreach (Signed)
[facility]) Care Management  08/26/2017  MARLEE ARMENTEROS 1966-03-06 [id number]    Medication Adherence call to Mrs : Bhavana Kady left a message for patient to call back patient is due on Lisinopril 20 mg Mrs, [employer] is showing past due under [place] [employer].  [place] Management Direct Dial [phone]  Fax [phone] [email or web address]

## 2017-08-31 IMAGING — CT CT ABD-PELV W/ CM
2 of 4 series · 16 of 46 positions shown, 18 images · IV contrast (Isovue)
Comparison: Abdominal radiographs April 25, 2016 and CT abdomen
and pelvis October 01, 2015

CLINICAL DATA: Constipation for 1 week. Nausea and vomiting.
History of hysterectomy, cholecystectomy, hiatal hernia, lupus,
diverticulitis, Billroth 2.

EXAM:
CT ABDOMEN AND PELVIS WITH CONTRAST
TECHNIQUE: Multidetector CT imaging of the abdomen and pelvis was performed
using the standard protocol following bolus administration of
intravenous contrast.
CONTRAST:  100mL [id number] IOPAMIDOL (FIUSW2-W22) INJECTION 61%

[Series 2: axial st · axial · 0.68mm/px · z∈[-485,-60]mm · 13 of 95 slices shown, 15 images]
[im 5/95  soft-tissue]
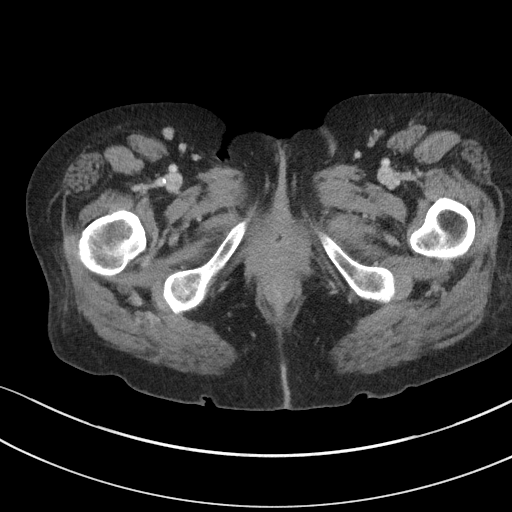
[im 5/95  bone]
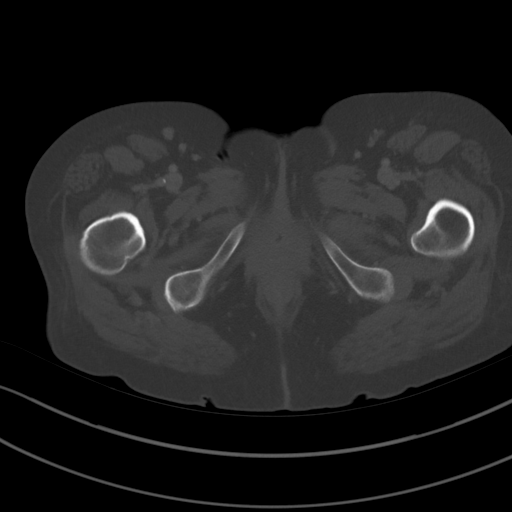
[im 14/95  soft-tissue]
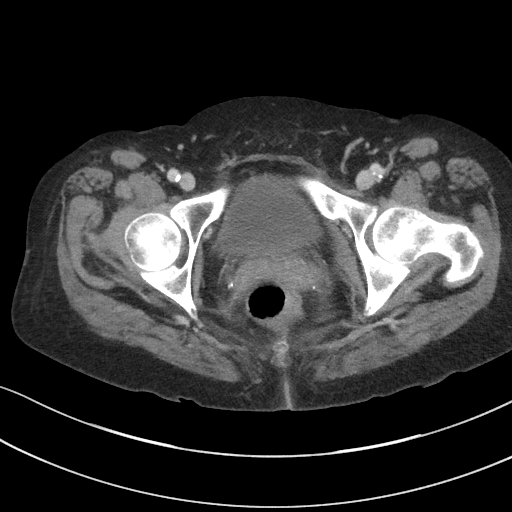
[im 18/95  soft-tissue]
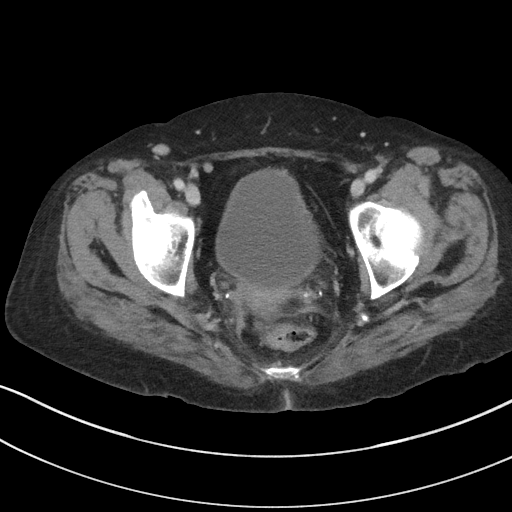
[im 27/95  soft-tissue]
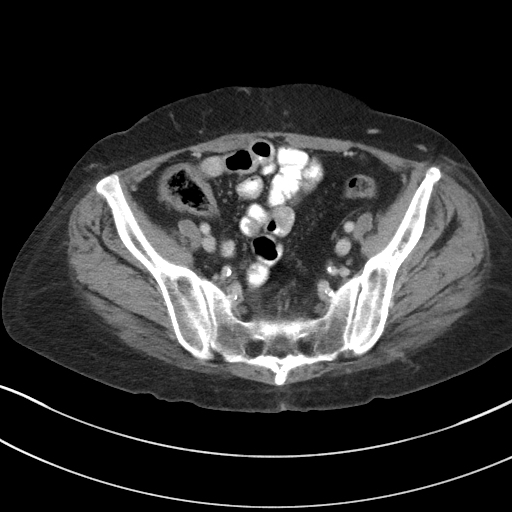
[im 32/95  soft-tissue]
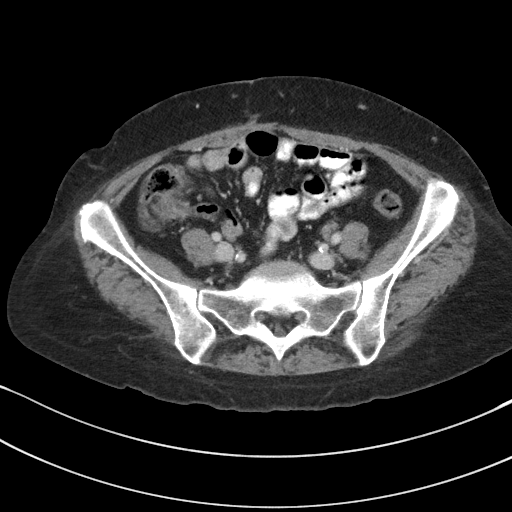
[im 41/95  soft-tissue]
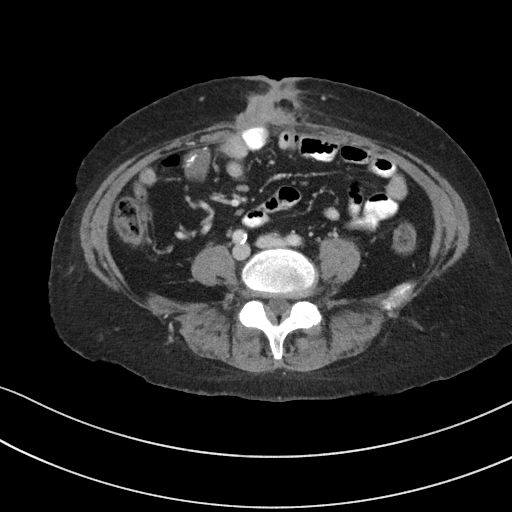
[im 50/95  soft-tissue]
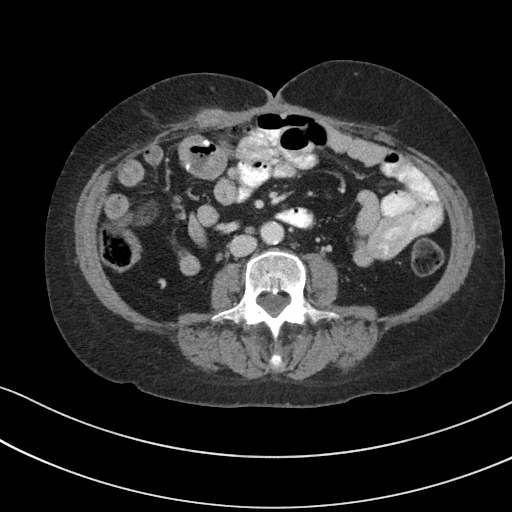
[im 54/95  soft-tissue]
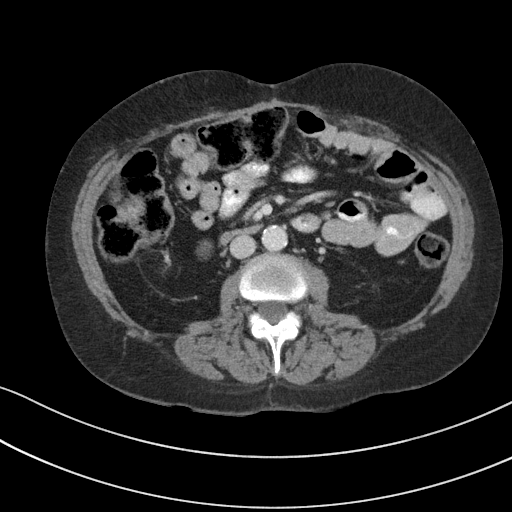
[im 63/95  soft-tissue]
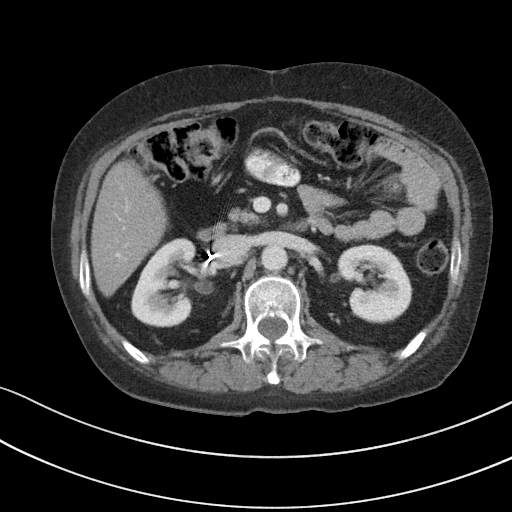
[im 63/95  bone]
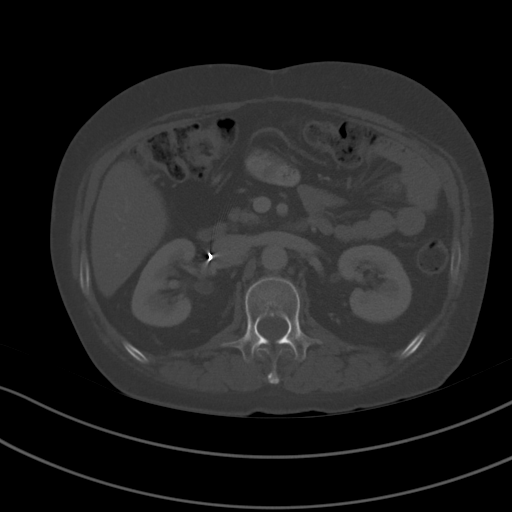
[im 68/95  soft-tissue]
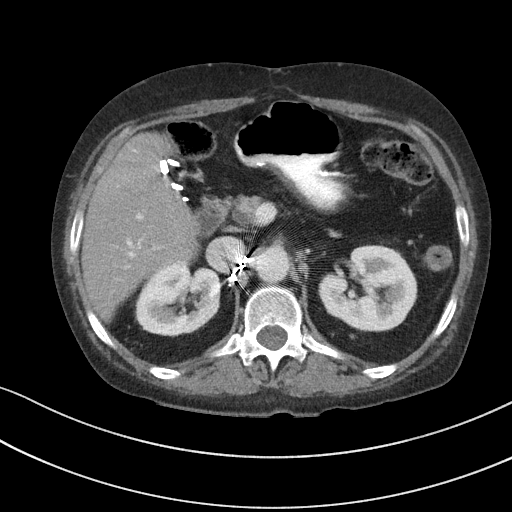
[im 77/95  soft-tissue]
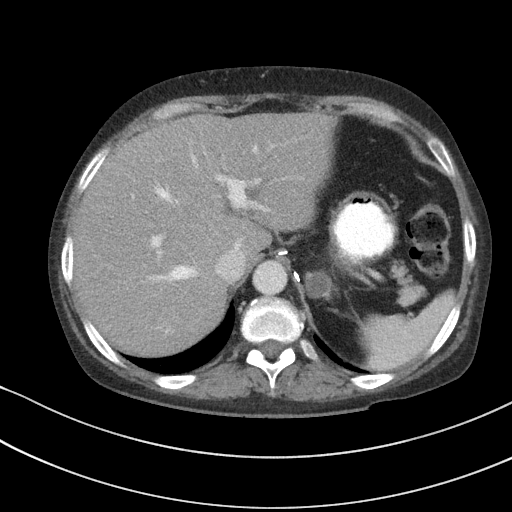
[im 81/95  soft-tissue]
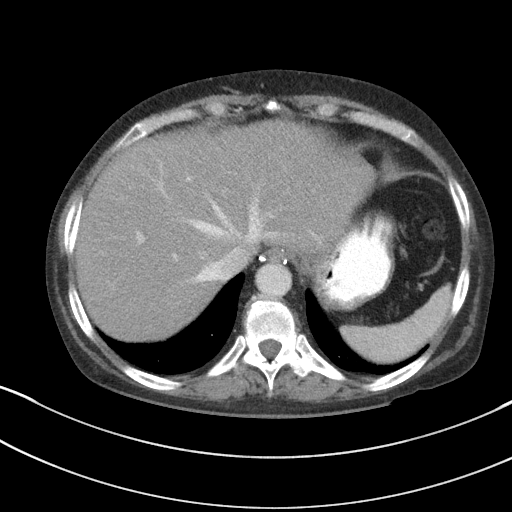
[im 90/95  soft-tissue]
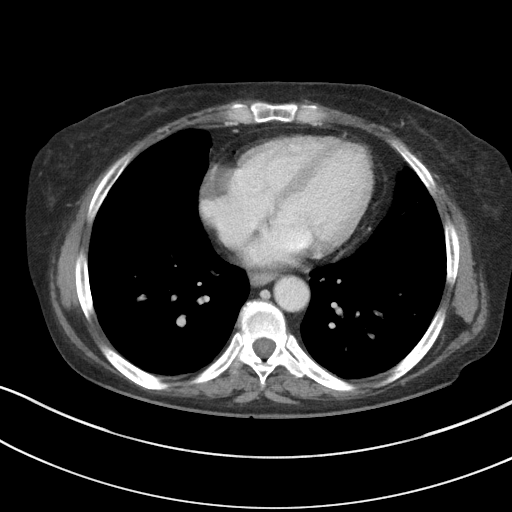

[Series 6: coronal st · coronal · 0.82mm/px · 3 of 103 slices shown]
[im 35/103  soft-tissue]
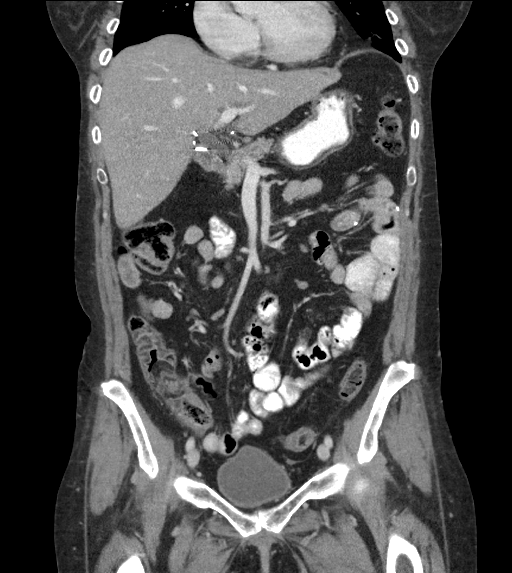
[im 46/103  soft-tissue]
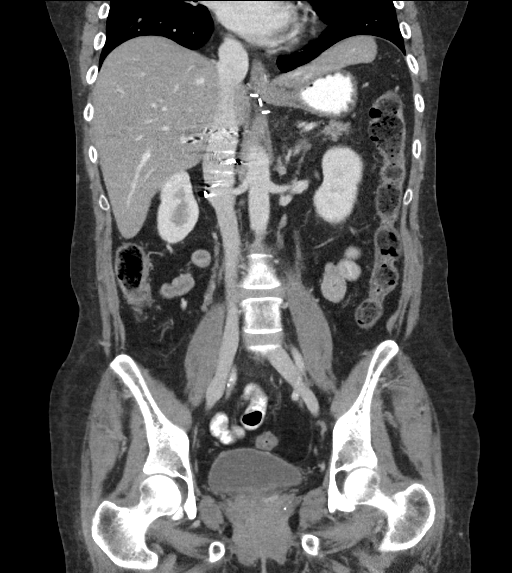
[im 57/103  soft-tissue]
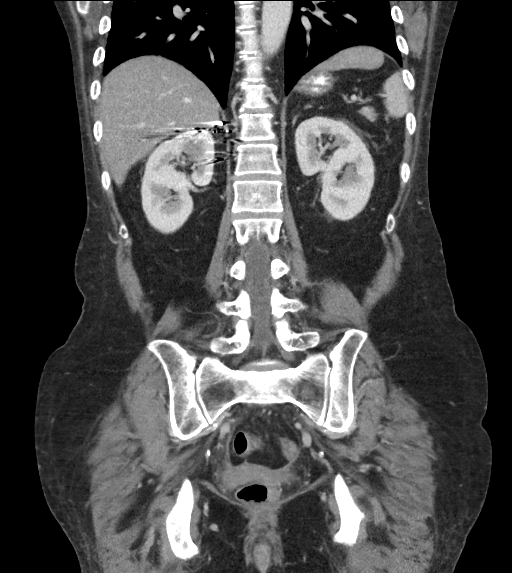

[16 of 46 positions shown; findings below may reference images not displayed]

FINDINGS: LOWER CHEST: Lingular scarring and dependent atelectasis. Heart size
is normal. No pericardial effusions. Status post fundoplication and
gastrojejunostomy.

HEPATOBILIARY: The liver is diffusely hypodense compatible with
steatosis. Status post cholecystectomy.

PANCREAS: Normal.

SPLEEN: Normal.

ADRENALS/URINARY TRACT: Kidneys are orthotopic, demonstrating
symmetric enhancement. No nephrolithiasis, hydronephrosis or solid
renal masses. The unopacified ureters are normal in course and
caliber. Delayed imaging through the kidneys demonstrates symmetric
prompt contrast excretion within the proximal urinary collecting
system. Urinary bladder is partially distended and unremarkable.
cm benign LEFT adrenal adenoma (8 Hounsfield units). Status post
apparent RIGHT adrenalectomy.

STOMACH/BOWEL: RIGHT lower quadrant small bowel anastomosis with
small bowel feces compatible with chronic stasis. Moderate retained
large bowel stool without bowel obstruction. Small appendix versus
residual stump.

VASCULAR/LYMPHATIC: Aortoiliac vessels are normal in course and
caliber, mild calcific atherosclerosis. No lymphadenopathy by CT
size criteria.

REPRODUCTIVE: Status post hysterectomy.

OTHER: Trace free fluid in the pelvis, likely physiologic. No focal
fluid collection or intraperitoneal free air. Phleboliths in the
pelvis.

MUSCULOSKELETAL: Unchanged of tear abdominal wall scarring.
IMPRESSION: No acute intra-abdominal or pelvic process.

Extensive postsurgical changes of the abdomen.

Moderate large bowel stool without bowel obstruction.

## 2017-08-31 IMAGING — DX DG ABDOMEN ACUTE W/ 1V CHEST
3 series · 3 of 3 positions shown · non-contrast
Comparison: 04/19/2016

CLINICAL DATA: Upper abdominal pain

EXAM:
DG ABDOMEN ACUTE W/ 1V CHEST

[chest pa]
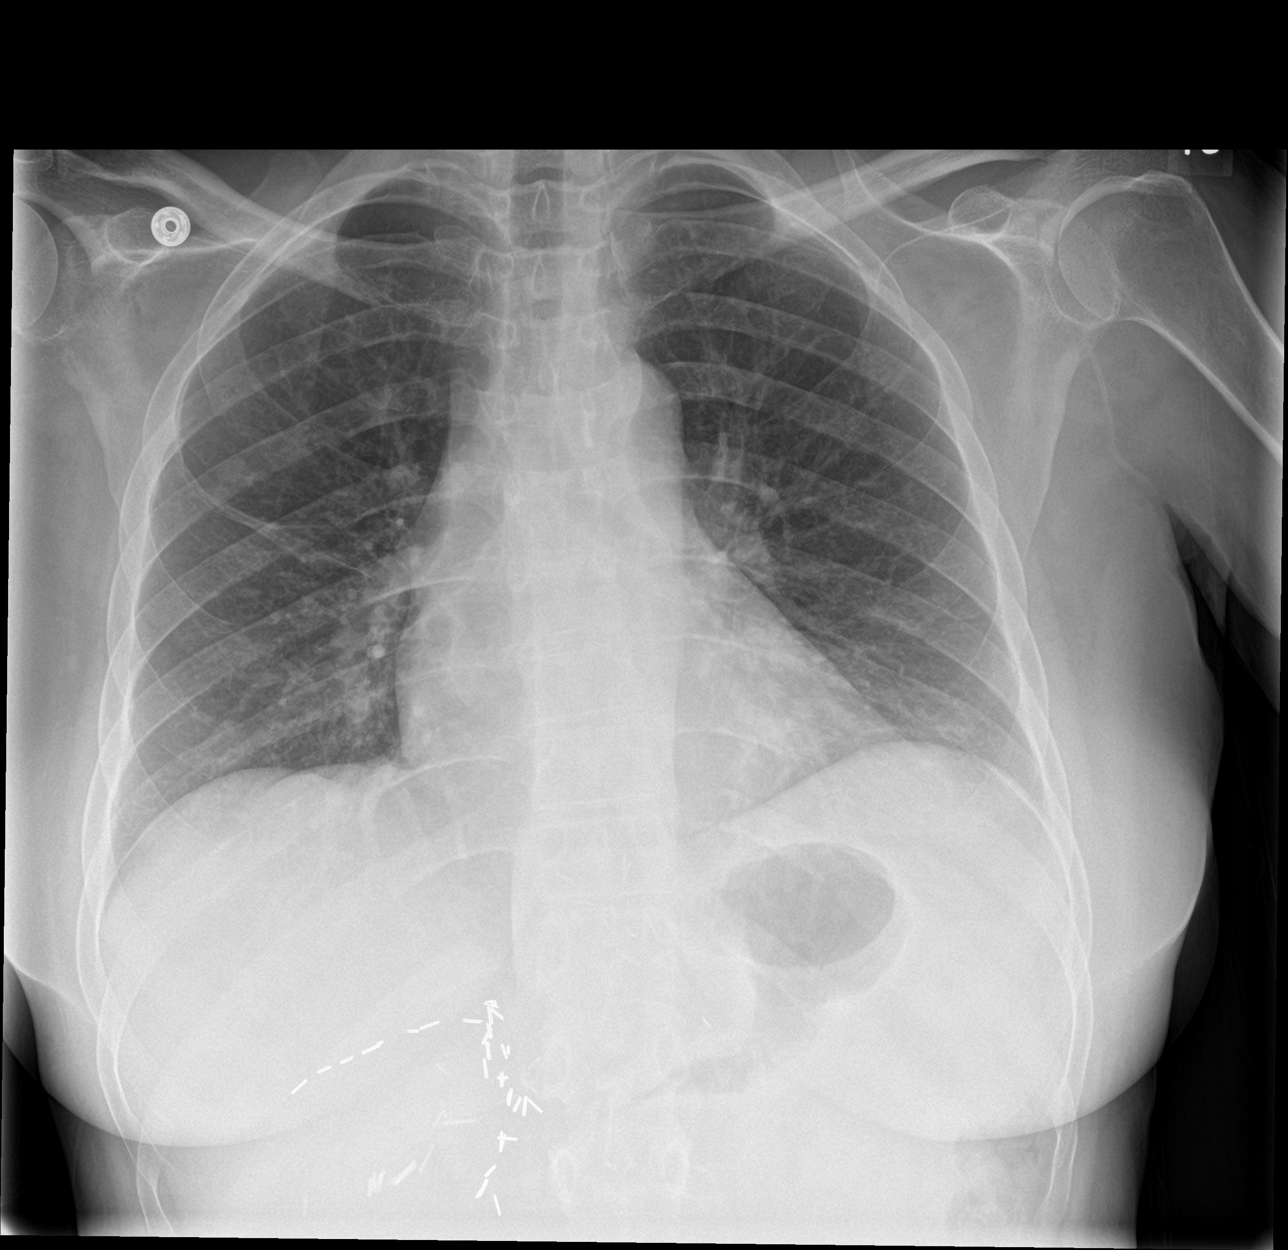

[abdomen erect]
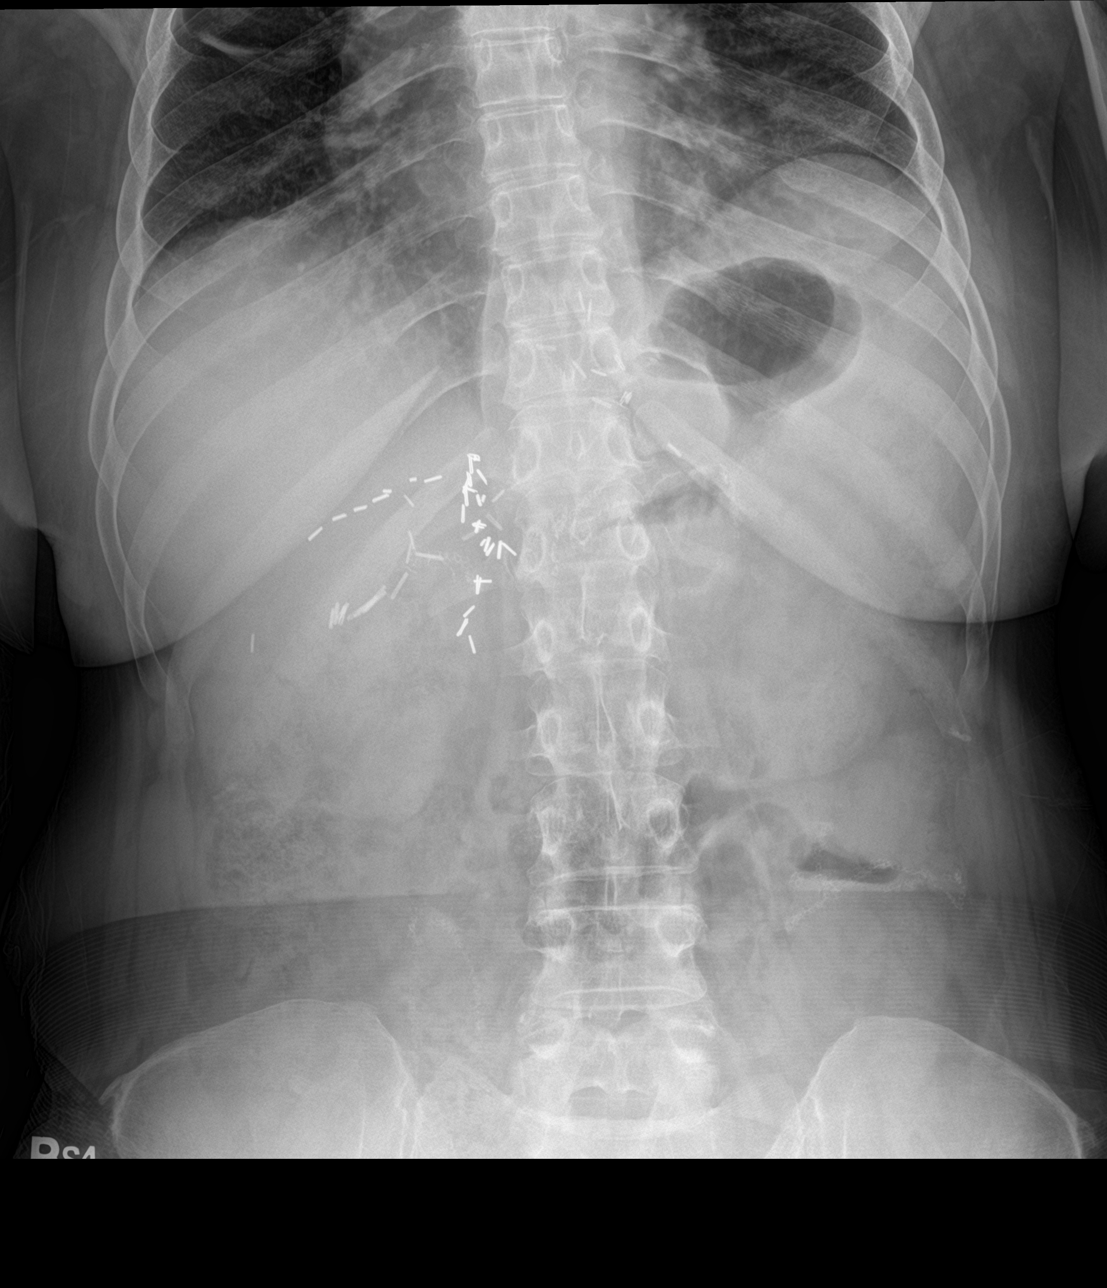

[abdomen supine]
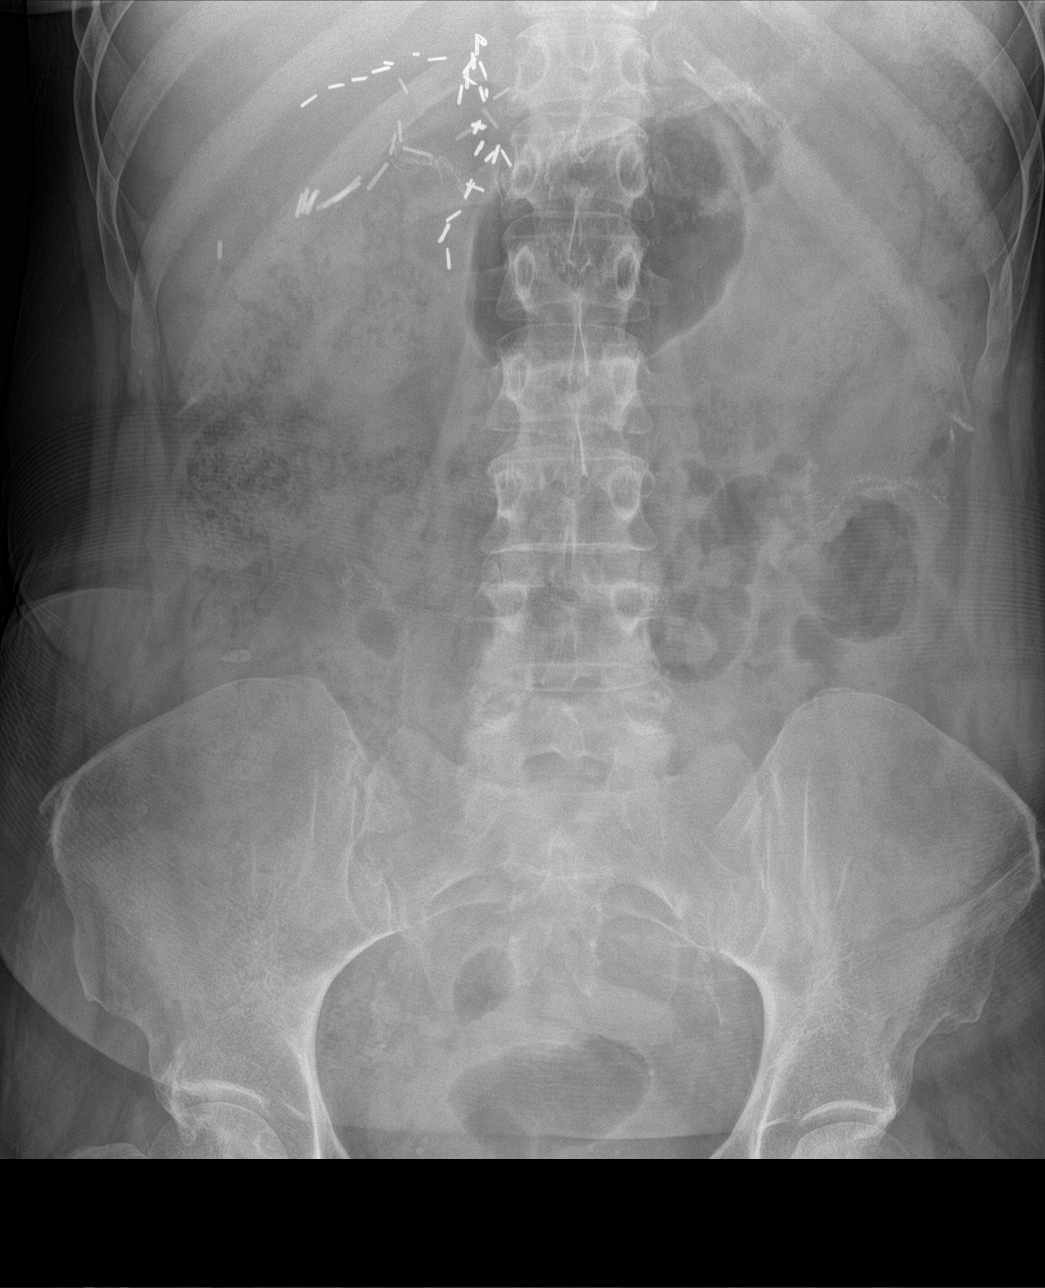

[3 of 3 positions shown; findings below may reference images not displayed]

FINDINGS: Cardiac shadow is stable. Lungs are well aerated bilaterally. Mild
scarring is noted in the right mid lung better visualized than on
the prior exam due to some projectional differences.

The abdomen shows a nonobstructive bowel gas pattern. Fecal material
is noted throughout the colon but decreased from the prior study.
Postsurgical changes are seen. No acute bony abnormality is noted.
No free air is seen.
IMPRESSION: Decrease in the degree of fecal material within the colon. No
obstructive changes are seen. No acute abnormality is noted in the
chest.

## 2017-09-27 IMAGING — DX DG CHEST 2V
2 series · 2 of 2 positions shown · non-contrast
Comparison: 04/25/2016

CLINICAL DATA: Abdominal pain, chest pain, vomiting for 4 days
pound

EXAM:
CHEST  2 VIEW

[chest pa]
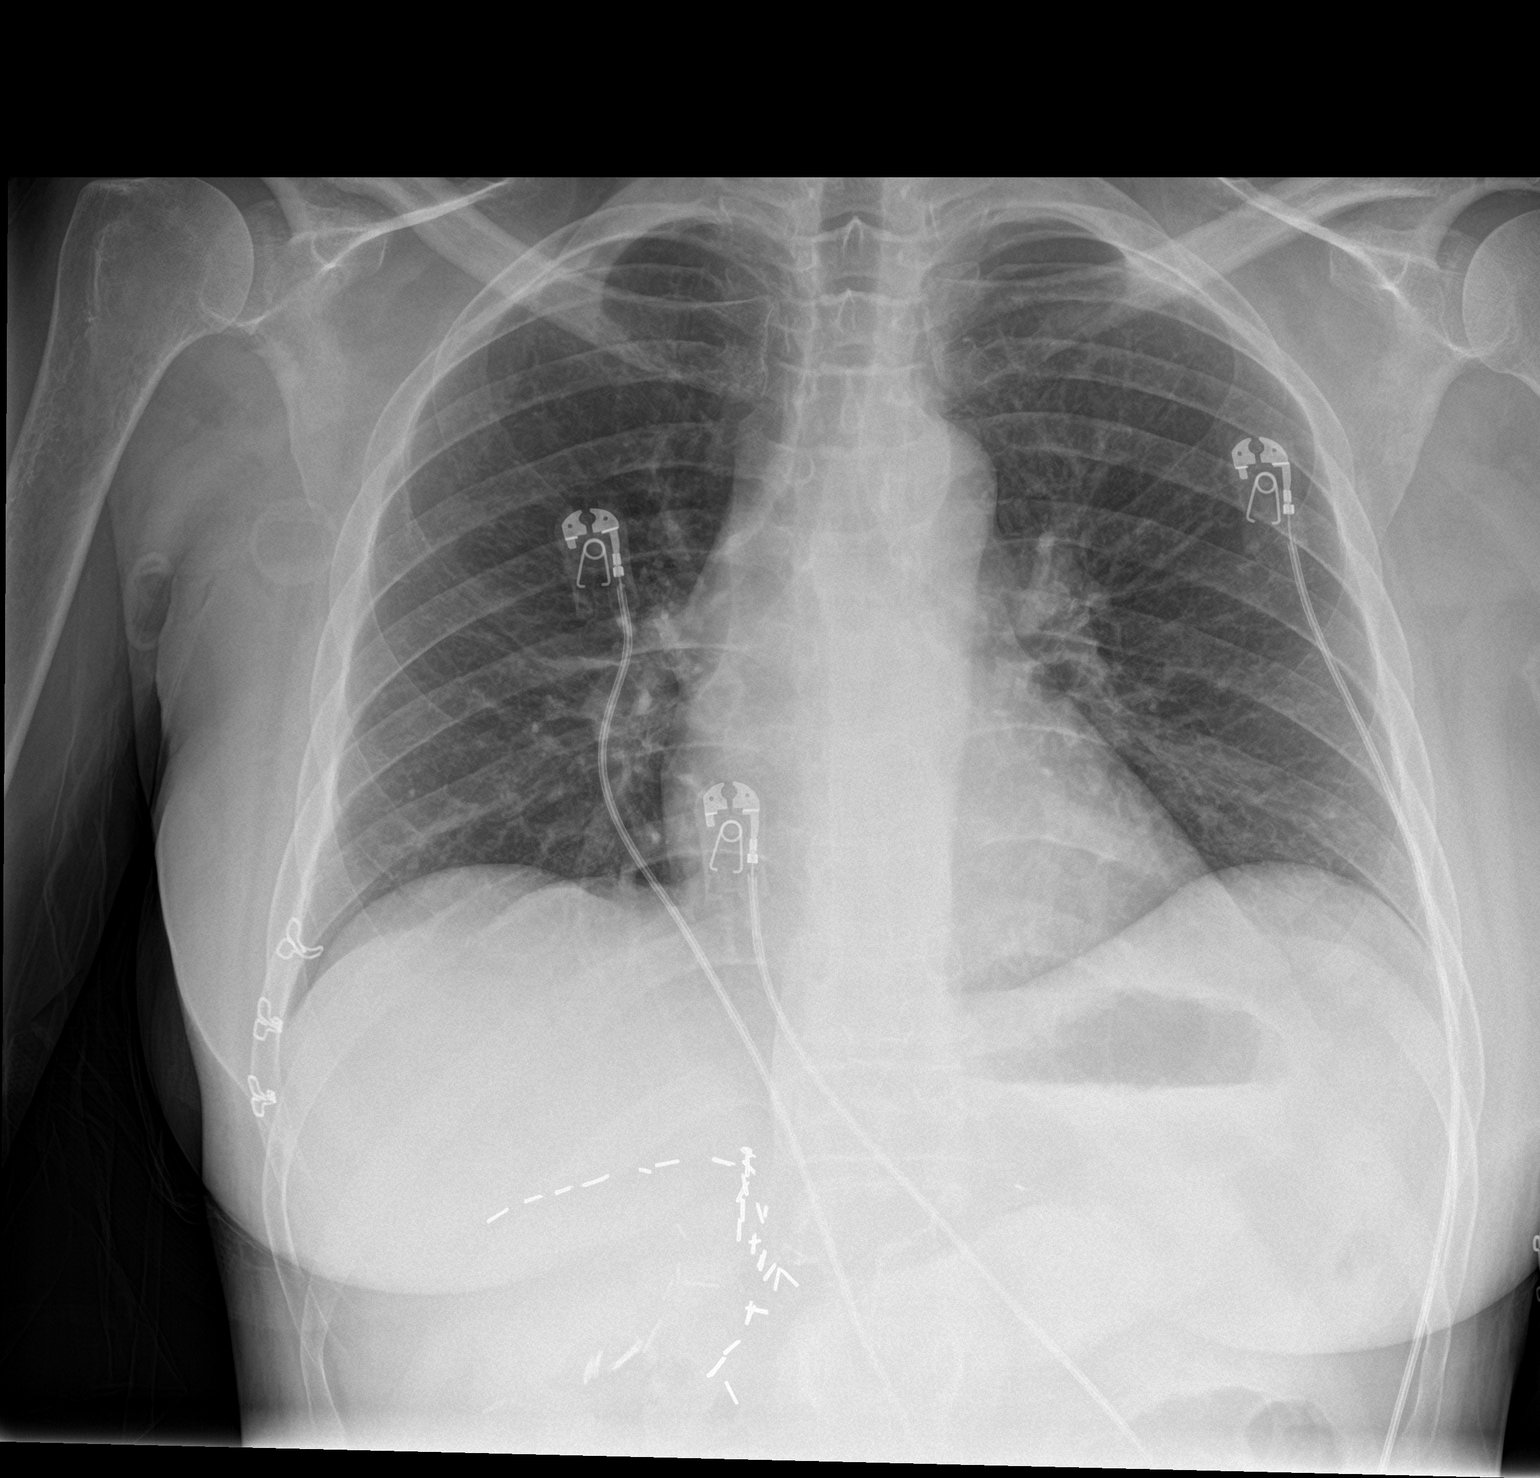

[chest lat]
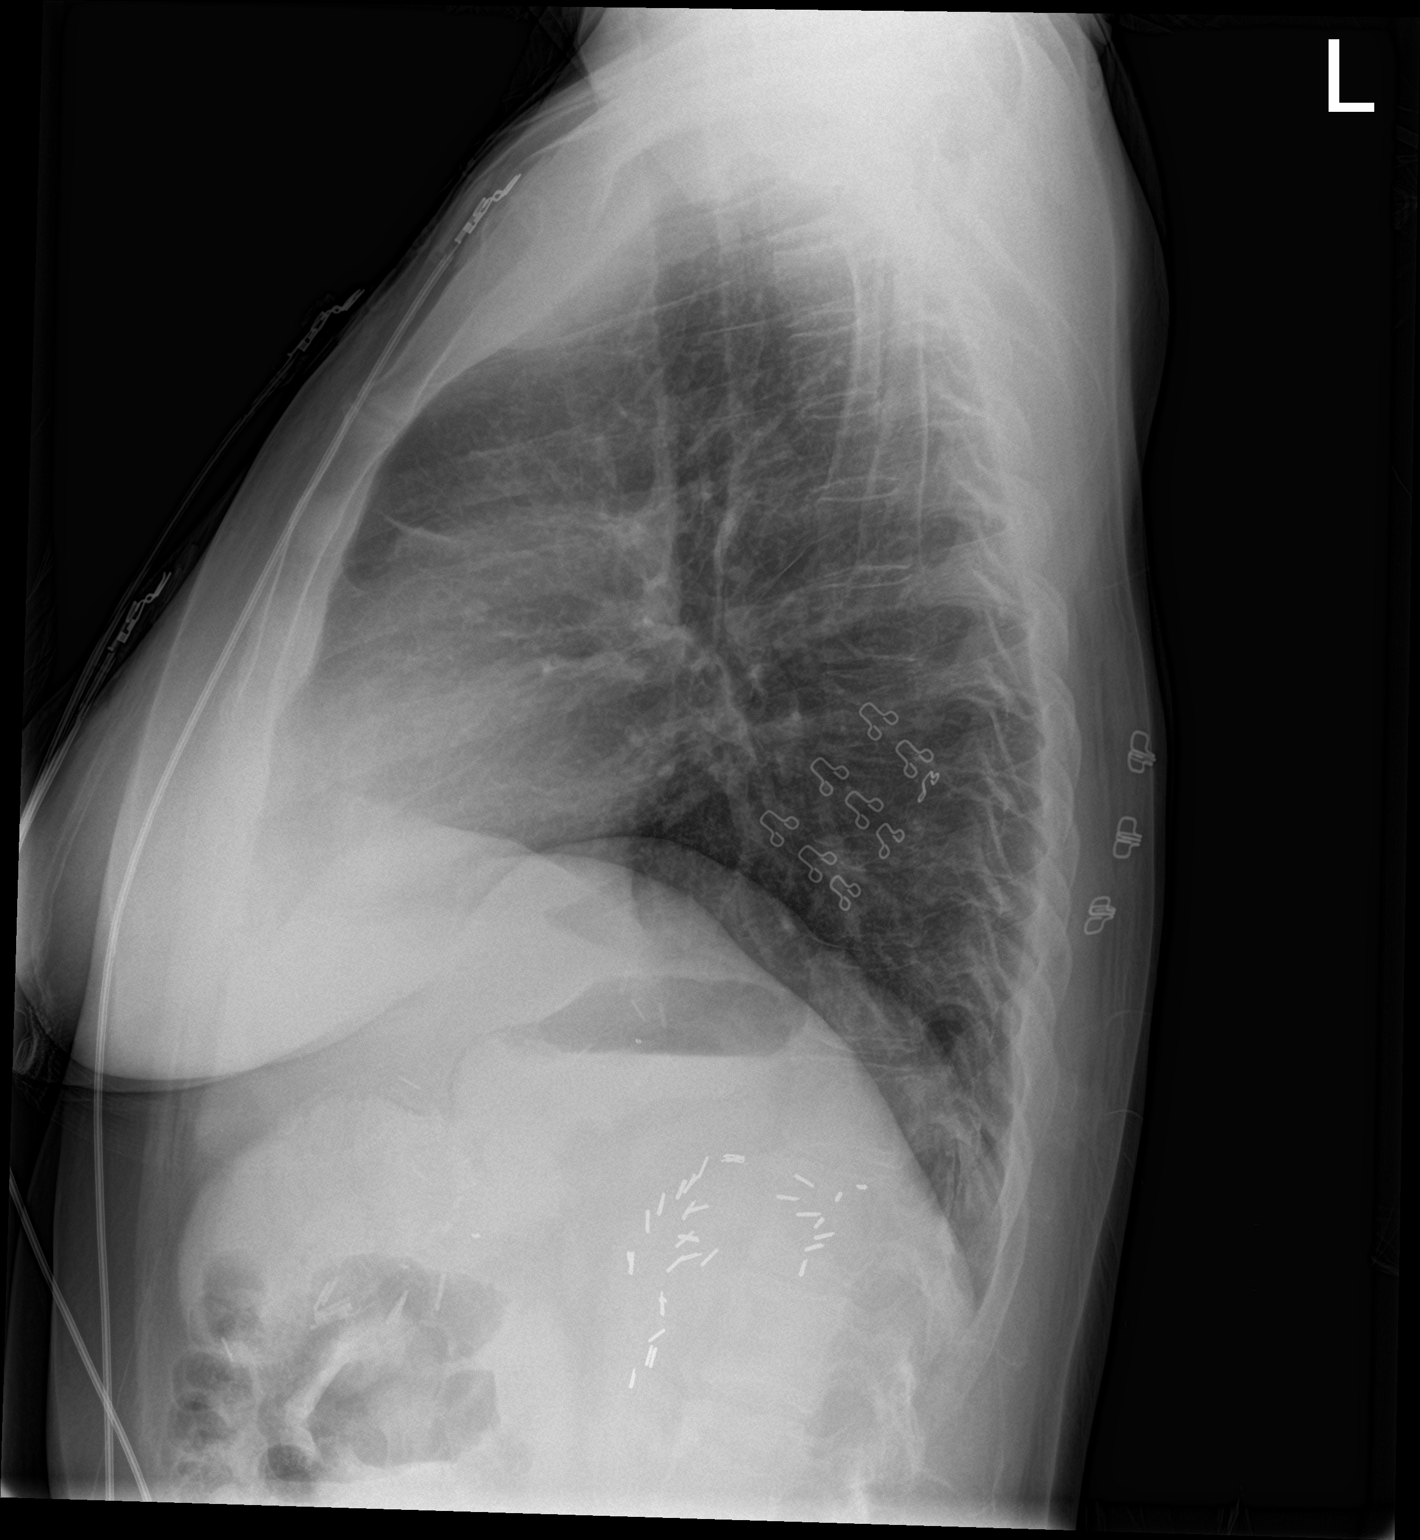

[2 of 2 positions shown; findings below may reference images not displayed]

FINDINGS: Cardiomediastinal silhouette is stable. No infiltrate or pleural
effusion. No pulmonary edema. Again noted multiple surgical clips in
the upper and mid abdomen.
IMPRESSION: No active cardiopulmonary disease.

## 2017-09-30 ENCOUNTER — Emergency Department (HOSPITAL_COMMUNITY): Payer: Medicare Other

## 2017-09-30 ENCOUNTER — Other Ambulatory Visit: Payer: Self-pay

## 2017-09-30 ENCOUNTER — Emergency Department (HOSPITAL_COMMUNITY)
Admission: EM | Admit: 2017-09-30 | Discharge: 2017-10-01 | Disposition: A | Discharge status: Home / Self Care | Payer: Medicare Other | Attending: Emergency Medicine | Admitting: Emergency Medicine

## 2017-09-30 ENCOUNTER — Encounter (HOSPITAL_COMMUNITY): Payer: Self-pay | Admitting: Emergency Medicine

## 2017-09-30 DIAGNOSIS — F1010 Alcohol abuse, uncomplicated: Secondary | ICD-10-CM | POA: Diagnosis not present

## 2017-09-30 DIAGNOSIS — F329 Major depressive disorder, single episode, unspecified: Secondary | ICD-10-CM | POA: Insufficient documentation

## 2017-09-30 DIAGNOSIS — R1084 Generalized abdominal pain: Secondary | ICD-10-CM | POA: Insufficient documentation

## 2017-09-30 DIAGNOSIS — F1410 Cocaine abuse, uncomplicated: Secondary | ICD-10-CM

## 2017-09-30 DIAGNOSIS — R112 Nausea with vomiting, unspecified: Secondary | ICD-10-CM

## 2017-09-30 DIAGNOSIS — Z9049 Acquired absence of other specified parts of digestive tract: Secondary | ICD-10-CM | POA: Insufficient documentation

## 2017-09-30 DIAGNOSIS — Z8673 Personal history of transient ischemic attack (TIA), and cerebral infarction without residual deficits: Secondary | ICD-10-CM | POA: Insufficient documentation

## 2017-09-30 DIAGNOSIS — F17210 Nicotine dependence, cigarettes, uncomplicated: Secondary | ICD-10-CM | POA: Insufficient documentation

## 2017-09-30 DIAGNOSIS — Z79899 Other long term (current) drug therapy: Secondary | ICD-10-CM | POA: Diagnosis not present

## 2017-09-30 DIAGNOSIS — R197 Diarrhea, unspecified: Secondary | ICD-10-CM | POA: Diagnosis not present

## 2017-09-30 DIAGNOSIS — F259 Schizoaffective disorder, unspecified: Secondary | ICD-10-CM | POA: Insufficient documentation

## 2017-09-30 DIAGNOSIS — R109 Unspecified abdominal pain: Secondary | ICD-10-CM | POA: Diagnosis not present

## 2017-09-30 DIAGNOSIS — R1111 Vomiting without nausea: Secondary | ICD-10-CM | POA: Diagnosis not present

## 2017-09-30 DIAGNOSIS — F419 Anxiety disorder, unspecified: Secondary | ICD-10-CM | POA: Insufficient documentation

## 2017-09-30 DIAGNOSIS — D573 Sickle-cell trait: Secondary | ICD-10-CM | POA: Insufficient documentation

## 2017-09-30 DIAGNOSIS — E039 Hypothyroidism, unspecified: Secondary | ICD-10-CM | POA: Diagnosis not present

## 2017-09-30 LAB — COMPREHENSIVE METABOLIC PANEL
ALBUMIN: 3.8 g/dL (ref 3.5–5.0)
ALK PHOS: 110 U/L (ref 38–126)
ALT: 22 U/L (ref 0–44)
AST: 27 U/L (ref 15–41)
Anion gap: 6 (ref 5–15)
BILIRUBIN TOTAL: 0.3 mg/dL (ref 0.3–1.2)
BUN: 11 mg/dL (ref 6–20)
CO2: 25 mmol/L (ref 22–32)
CREATININE: 0.66 mg/dL (ref 0.44–1.00)
Calcium: 9.5 mg/dL (ref 8.9–10.3)
Chloride: 106 mmol/L (ref 98–111)
GFR calc Af Amer: >60 mL/min (ref >60)
GFR calc non Af Amer: >60 mL/min (ref >60)
GLUCOSE: 88 mg/dL (ref 70–99)
Potassium: 4.4 mmol/L (ref 3.5–5.1)
Sodium: 137 mmol/L (ref 135–145)
Total Protein: 8.2 g/dL — ABNORMAL HIGH (ref 6.5–8.1)

## 2017-09-30 LAB — URINALYSIS, ROUTINE W REFLEX MICROSCOPIC
Bilirubin Urine: NEGATIVE
Glucose, UA: NEGATIVE mg/dL
HGB URINE DIPSTICK: NEGATIVE
Ketones, ur: NEGATIVE mg/dL
Leukocytes, UA: NEGATIVE
Nitrite: NEGATIVE
PROTEIN: NEGATIVE mg/dL
SPECIFIC GRAVITY, URINE: 1.001 — AB (ref 1.005–1.030)
pH: 6 (ref 5.0–8.0)

## 2017-09-30 LAB — RAPID URINE DRUG SCREEN, HOSP PERFORMED
AMPHETAMINES: NOT DETECTED
Benzodiazepines: NOT DETECTED
Cocaine: POSITIVE — AB
Opiates: NOT DETECTED
TETRAHYDROCANNABINOL: NOT DETECTED

## 2017-09-30 LAB — CBC
HCT: 35.9 % — ABNORMAL LOW (ref 36.0–46.0)
Hemoglobin: 12.2 g/dL (ref 12.0–15.0)
MCH: 28.2 pg (ref 26.0–34.0)
MCHC: 34 g/dL (ref 30.0–36.0)
MCV: 83.1 fL (ref 78.0–100.0)
PLATELETS: 376 10*3/uL (ref 150–400)
RBC: 4.32 MIL/uL (ref 3.87–5.11)
RDW: 19.1 % — AB (ref 11.5–15.5)
WBC: 7.4 10*3/uL (ref 4.0–10.5)

## 2017-09-30 LAB — LIPASE, BLOOD: Lipase: 26 U/L (ref 11–51)

## 2017-09-30 MED ORDER — ONDANSETRON HCL 4 MG/2ML IJ SOLN
4.0000 mg | Freq: Once | INTRAMUSCULAR | Status: AC
  Administered 2017-09-30: 4 mg via INTRAVENOUS
  Filled 2017-09-30: qty 2

## 2017-09-30 MED ORDER — HYDROMORPHONE HCL 1 MG/ML IJ SOLN
1.0000 mg | Freq: Once | INTRAMUSCULAR | Status: AC
Start: 2017-09-30 — End: 2017-09-30
  Administered 2017-09-30: 1 mg via INTRAVENOUS
  Filled 2017-09-30: qty 1

## 2017-09-30 MED ORDER — SODIUM CHLORIDE 0.9 % IV BOLUS
1000.0000 mL | Freq: Once | INTRAVENOUS | Status: AC
  Administered 2017-09-30: 1000 mL via INTRAVENOUS

## 2017-09-30 MED ORDER — PROMETHAZINE HCL 25 MG/ML IJ SOLN
25.0000 mg | Freq: Once | INTRAMUSCULAR | Status: AC
  Administered 2017-09-30: 25 mg via INTRAVENOUS
  Filled 2017-09-30: qty 1

## 2017-09-30 NOTE — ED Notes (Signed)
Pt aware of urine sample needed. States she can't go at this time.

## 2017-09-30 NOTE — ED Triage Notes (Signed)
Pt c/o abd pain with n/v/d x 3 days.  

## 2017-09-30 NOTE — ED Notes (Signed)
Patient transported to CT 

## 2017-09-30 NOTE — ED Provider Notes (Signed)
[facility] EMERGENCY DEPARTMENT Provider Note   CSN: [id number] Arrival date & time: 09/30/17  1933     History   Chief Complaint Chief Complaint  Patient presents with  . Abdominal Pain    HPI Angel Price is a 52 y.o. female.  Pt presents to the ED today with abdominal pain and n/v.  The pt has a hx of chronic abd pain, but said this is negative.       Past Medical History:  Diagnosis Date  . Abscess    soft tissue  . Adrenal mass ([place])   . Alcohol abuse   . Anxiety   . Blood transfusion without reported diagnosis   . Chronic abdominal pain   . Chronic wound infection of abdomen   . Colon polyp    colonoscopy 04/2014  . Depression   . Diverticulosis    colonoscopy 04/2014 moderat pan colonic  . Drug-seeking behavior   . Gastritis    EGD 05/2014  . Gastroparesis Nov 2015  . GERD (gastroesophageal reflux disease)   . Hemorrhoid    internal large  . Hiatal hernia   . History of Billroth II operation   . Hypertension   . Lung nodule    CT 02/2014 needs repeat 1 month  . Lung nodule < 6cm on CT 04/25/2014  . Lupus ([place])   . Malingering   . Nausea and vomiting    chronic, recurrent  . Pancreatitis   . Schatzki's ring    patent per EGD 04/2014  . Sickle cell trait ([place])   . Suicide attempt ([place])   . 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 Hemorrhoidal skin tag   . SBO (small bowel obstruction) ([place])   . Non-intractable vomiting with nausea   . Folate deficiency 05/11/2017  . Abdominal wall fistula   . Cellulitis of abdominal wall 01/02/2017  . Acute left hemiparesis ([place]) 12/05/2016  . Malingering 12/05/2016  . Weakness of left lower extremity 12/02/2016  . CVA (cerebral vascular accident) ([place]) 11/23/2016  . Bright red rectal bleeding 08/22/2016  . Hypotension due to blood loss   . Acute GI bleeding 05/24/2016  . Dieulafoy lesion of duodenum   . History of Billroth II operation     . Gastrointestinal hemorrhage 05/22/2016  . Absolute anemia   . Acute pancreatitis 10/01/2015  . Abnormal CT scan of lung 10/01/2015  . Alcohol intoxication ([place])   . Left-sided weakness   . Psychosomatic factor in physical condition   . Upper GI bleed   . Diverticulosis of colon with hemorrhage   . Chronic wound infection of abdomen 12/22/2014  . Thyroid disease 12/22/2014  . HTN (hypertension) 12/22/2014  . Ataxia 11/01/2014  . Hemorrhoids 04/26/2014  . Lung nodule 04/26/2014  . Diverticulosis   . Gastritis   . Hiatal hernia   . Schatzki's ring   . Acute blood loss anemia 04/25/2014  . Sinus tachycardia 04/25/2014  . Hypokalemia 04/25/2014  . Hematemesis with nausea 04/25/2014  . Lung nodule < 6cm on CT 04/25/2014  . Intractable nausea and vomiting 04/24/2014  . Gastroparesis   . Abdominal pain 04/01/2014  . Chronic abdominal pain 01/21/2014  . Gastroenteritis 12/10/2013  . Chronic abdominal wound infection 08/16/2013  . MDD (major depressive disorder), recurrent episode, severe ([place]) 06/27/2013  . Wrist laceration 06/24/2013  . Diarrhea 05/07/2013  . Rectal bleeding 05/07/2013  . Abnormal LFTs 05/07/2013  .  Adrenal mass, left ([place]) 05/07/2013  . Abdominal wall abscess at site of surgical wound 04/19/2013  . Abdominal wall abscess 04/18/2013  . Frequent headaches 03/30/2013  . Sleep difficulties 03/30/2013  . Essential hypertension, benign 03/30/2013  . History of cocaine abuse 03/16/2013  . History of schizoaffective disorder 03/16/2013  . Bipolar disorder ([place]) 03/16/2013  . Personality disorder ([place]) 03/16/2013  . Tobacco abuse 03/16/2013  . Alcohol abuse 03/16/2013  . Palpitations 03/16/2013  . Poor vision 03/16/2013  . History of gastric bypass 03/16/2013  . Status post hysterectomy with oophorectomy 03/16/2013  . Hypothyroid 03/16/2013  . Lupus ([place]) 03/16/2013    Past Surgical History:  Procedure Laterality Date  . ABDOMINAL HYSTERECTOMY  2013    [place]  . ABDOMINAL SURGERY    . ADRENALECTOMY Right   . AGILE CAPSULE N/A 01/05/2015   Procedure: AGILE CAPSULE;  Surgeon: Daneil Dolin, MD;  Location: [facility] ENDO SUITE;  Service: Endoscopy;  Laterality: N/A;  0700  . Billroth II procedure      [place], first 2000, 2005/2006.  Marland Kitchen BIOPSY  05/20/2013   Procedure: BIOPSIES OF ASCENDING AND SIGMOID COLON;  Surgeon: Daneil Dolin, MD;  Location: [facility] ORS;  Service: Endoscopy;;  . BIOPSY  04/26/2014   Procedure: BIOPSIES;  Surgeon: Danie Binder, MD;  Location: [facility] ORS;  Service: Endoscopy;;  . CHOLECYSTECTOMY    . COLONOSCOPY     in [place]  . COLONOSCOPY WITH PROPOFOL N/A 05/20/2013   Dr.Rourk- inadequate prep, normal appearing rectum, grossly normal colon aside from pancolonic diverticula, normal terminal ileum bx= unremarkable colonic mucosa. Due for early interval 2016.   Marland Kitchen COLONOSCOPY WITH PROPOFOL N/A 04/26/2014   FBP:ZWCHEN ileum/one colon polyp removed/moderate pan-colonic diverticulosis/large internal hemorrhoids  . COLONOSCOPY WITH PROPOFOL N/A 12/23/2014   Dr.Rourk- minimal internal hemorrhoids, pancolonic diverticulosis  . COLONOSCOPY WITH PROPOFOL N/A 08/23/2016   Procedure: COLONOSCOPY WITH PROPOFOL;  Surgeon: Danie Binder, MD;  Location: [facility];  Service: Endoscopy;  Laterality: N/A;  . DEBRIDEMENT OF ABDOMINAL WALL ABSCESS N/A 02/08/2013   Procedure: DEBRIDEMENT OF ABDOMINAL WALL ABSCESS;  Surgeon: Jamesetta So, MD;  Location: [facility] ORS;  Service: General;  Laterality: N/A;  . ESOPHAGOGASTRODUODENOSCOPY (EGD) WITH PROPOFOL N/A 05/20/2013   Dr.Rourk- s/p prior gastric surgery with normal esophagus, residual gastric mucosa and patent efferent limb  . ESOPHAGOGASTRODUODENOSCOPY (EGD) WITH PROPOFOL N/A 02/03/2014   Dr. Gala Romney:  s/p hemigastrectomy with retained gastric contents. Residual gastric mucosa and efferent limb appeared normal otherwise. Query gastroparesis.   Marland Kitchen ESOPHAGOGASTRODUODENOSCOPY (EGD) WITH PROPOFOL N/A 04/26/2014    IDP:OEUMPNTI'R ring/small HH/mild non-erosive gasrtitis/normal anastomosis  . ESOPHAGOGASTRODUODENOSCOPY (EGD) WITH PROPOFOL N/A 12/23/2014   Dr.Rourk- s/p prior hemigastrctomy, active oozing from anastomotic suture site, hemostasis achieved  . ESOPHAGOGASTRODUODENOSCOPY (EGD) WITH PROPOFOL N/A 05/23/2016   Dr. 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 ESOPHAGOGASTRODUODENOSCOPY (EGD) WITH PROPOFOL N/A 05/11/2017   Procedure: ESOPHAGOGASTRODUODENOSCOPY (EGD) WITH PROPOFOL;  Surgeon: Daneil Dolin, MD;  Location[facility];  Service: Endoscopy;  Laterality: N/A;  . HEMORRHOID SURGERY N/A 06/18/2017   Procedure: THREE COLUMN EXTENSIVE HEMORRHOIDECTOMY;  Surgeon: Virl Cagey, MD;  Location: [facility] OR[facility];  Service: General;  Laterality: N/A;  . INCISION AND DRAINAGE ABSCESS N/A 01/06/2017   Procedure: INCISION AND DRAINAGE ABDOMINAL WALL ABSCESS;  Surgeon: Aviva Signs, MD;  Location: [facility];  Service: General;  Laterality: N/A;  . tendon repar Right  wrist  . WOUND EXPLORATION Right 06/24/2013   Procedure: exploration of traumatic wound right wrist;  Surgeon: Tennis Must, MD;  Location: [facility];  Service: Orthopedics;  Laterality: Right;     OB History    Gravida  4   Para  4   Term  4   Preterm      AB      Living  3     SAB      TAB      Ectopic      Multiple      Live Births               Home Medications    Prior to Admission medications   Medication Sig Start Date End Date Taking? Authorizing Provider  amLODipine (NORVASC) 10 MG tablet Take 0.5 tablets (5 mg total) by mouth daily. 01/03/17   Doreatha Lew, MD  atenolol (TENORMIN) 50 MG tablet Take 50 mg by mouth daily.    [provider]  docusate sodium (COLACE) 100 MG capsule Take 1 capsule (100 mg total) by mouth 2 (two) times daily. 06/18/17 06/18/18  Virl Cagey, MD  escitalopram (LEXAPRO) 20  MG tablet Take 20 mg daily by mouth.    [provider]  hydrocortisone (ANUSOL-HC) 2.5 % rectal cream Place rectally 2 (two) times daily. Patient not taking: Reported on 07/03/2017 05/23/17   Murlean Iba, MD  HYDROmorphone (DILAUDID) 2 MG tablet Take 1 tablet (2 mg total) by mouth every 6 (six) hours as needed for severe pain. 06/23/17 06/23/18  Aviva Signs, MD  lisinopril (PRINIVIL,ZESTRIL) 20 MG tablet Take 1 tablet (20 mg total) by mouth daily. 11/26/16 11/26/17  Reyne Dumas, MD  loperamide (IMODIUM) 2 MG capsule Take 1 capsule (2 mg total) by mouth 4 (four) times daily as needed for diarrhea or loose stools. 07/01/17   Noemi Chapel, MD  ondansetron (ZOFRAN ODT) 4 MG disintegrating tablet Take 1 tablet (4 mg total) by mouth every 8 (eight) hours as needed for nausea. 07/01/17   Noemi Chapel, MD  pantoprazole (PROTONIX) 40 MG tablet Take 1 tablet (40 mg total) by mouth 2 (two) times daily. 05/23/17 07/03/17  Murlean Iba, MD  promethazine (PHENERGAN) 25 MG suppository Place 1 suppository (25 mg total) rectally every 6 (six) hours as needed for nausea or vomiting. 07/02/17   Francine Graven, DO  promethazine (PHENERGAN) 25 MG tablet Take 1 tablet (25 mg total) by mouth every 8 (eight) hours as needed for nausea. 07/03/17   Davonna Belling, MD    Family History Family History  Problem Relation Age of Onset  . Brain cancer Son   . Schizophrenia Son   . Cancer Son        brain  . Lung cancer Father   . Cancer Father        mets  . Drug abuse Mother   . Breast cancer Maternal Aunt   . Bipolar disorder Maternal Aunt   . Drug abuse Maternal Aunt   . Colon cancer Maternal Grandmother        late 3s, early 53s  . Drug abuse Sister   . Drug abuse Brother   . Bipolar disorder Paternal Grandfather   . Bipolar disorder Cousin   . Liver disease Neg Hx     Social History Social History   Tobacco Use  . Smoking status: Current Every Day Smoker    Packs/day: 0.50     Years: 35.00  Pack years: 17.50    Types: Cigarettes  . Smokeless tobacco: Never Used  Substance Use Topics  . Alcohol use: No    Alcohol/week: 0.0 oz    Comment: 1 beer 4 days ago  . Drug use: No    Types: Cocaine    Comment: quit 09/2012     Allergies   Codeine; Morphine and related; and Reglan [metoclopramide]   Review of Systems Review of Systems  Gastrointestinal: Positive for abdominal pain, nausea and vomiting.  All other systems reviewed and are negative.    Physical Exam Updated Vital Signs BP (!) 130/92   Pulse 69   Temp 98.5 F (36.9 C)   Resp 18   Ht 5\' 3"  (1.6 m)   Wt 59.4 kg (131 lb)   SpO2 100%   BMI 23.21 kg/m   Physical Exam  Constitutional: She is oriented to person, place, and time. She appears well-developed and well-nourished.  HENT:  Head: Normocephalic and atraumatic.  Mouth/Throat: Oropharynx is clear and moist.  Eyes: Pupils are equal, round, and reactive to light. EOM are normal.  Cardiovascular: Normal rate, regular rhythm, normal heart sounds and intact distal pulses.  Pulmonary/Chest: Effort normal and breath sounds normal.  Abdominal: Normal appearance and bowel sounds are normal. There is tenderness in the right lower quadrant.  Neurological: She is alert and oriented to person, place, and time.  Skin: Skin is warm. Capillary refill takes less than 2 seconds.  Psychiatric: She has a normal mood and affect. Her behavior is normal.  Nursing note and vitals reviewed.    ED Treatments / Results  Labs (all labs ordered are listed, but only abnormal results are displayed) Labs Reviewed  COMPREHENSIVE METABOLIC PANEL - Abnormal; Notable for the following components:      Result Value   Total Protein 8.2 (*)    All other components within normal limits  CBC - Abnormal; Notable for the following components:   HCT 35.9 (*)    RDW 19.1 (*)    All other components within normal limits  URINALYSIS, ROUTINE W REFLEX MICROSCOPIC -  Abnormal; Notable for the following components:   Color, Urine COLORLESS (*)    Specific Gravity, Urine 1.001 (*)    All other components within normal limits  RAPID URINE DRUG SCREEN, HOSP PERFORMED - Abnormal; Notable for the following components:   Cocaine POSITIVE (*)    Barbiturates   (*)    Value: Result not available. Reagent lot number recalled by manufacturer.   All other components within normal limits  LIPASE, BLOOD    EKG None  Radiology Ct Abdomen Pelvis Wo Contrast  Result Date: 09/30/2017 CLINICAL DATA:  Abdominal pain with nausea, vomiting and diarrhea x3 days. Knot on the right side of the abdomen. EXAM: CT ABDOMEN AND PELVIS WITHOUT CONTRAST TECHNIQUE: Multidetector CT imaging of the abdomen and pelvis was performed following the standard protocol without IV contrast. COMPARISON:  06/09/2017 CT FINDINGS: LOWER CHEST: Dependent atelectasis. Subpleural blebs posteriorly at the right lung base. Mild cardiomegaly without pericardial effusion. HEPATOBILIARY: Cholecystectomy. The unenhanced liver demonstrates no space-occupying mass. Chronic stable intra and extrahepatic ductal dilatation likely representing reservoir effect status post cholecystectomy. No choledocholithiasis. PANCREAS: Atrophic pancreas without mass, ductal dilatation or inflammation. SPLEEN: Normal size spleen without mass. ADRENALS/URINARY TRACT: Kidneys are orthotopic. No nephrolithiasis, hydronephrosis or solid renal masses. The unopacified ureters are normal in course and caliber. Urinary bladder is partially distended and unremarkable. Hypodense 2 cm nodule of the posterior limb of  the left adrenal gland compatible with a benign adenoma. Numerous surgical clips in the region of the right adrenal gland as before compatible with right adrenalectomy. STOMACH/BOWEL: Status post Billroth 2 gastrojejunostomy. No bowel obstruction or inflammation. VASCULAR/LYMPHATIC: Aortoiliac atherosclerosis. No aneurysm. No  lymphadenopathy by CT size criteria. REPRODUCTIVE: Hysterectomy.  No adnexal mass. OTHER: No intraperitoneal free fluid or free air. Periumbilical subcutaneous thick rimmed ventral fluid collection is again noted with scarring in the right lower quadrant that may account for the patient's palpable area of abnormality. The volume of fluid within the collection has remained roughly the same since prior measuring 7.1 x 1.8 x 4.1 cm (volume = 27 cm^3), series 2/49 versus 6.1 x 1 x 2.4 cm (volume = 27 cm^3). The fistulous connections suspected are less apparent current exam though not excluded. MUSCULOSKELETAL: Nonacute. IMPRESSION: 1. Redemonstration of periumbilical subcutaneous slightly thick walled collection averaging approximately 27 cubic cm. Previously suggested fistulous tracts are not well visualized currently but may still be present given presence of fluid. 2. Right lower quadrant subcutaneous soft tissue density without fluid likely representing a focus of scar tissue. 3. Status post Billroth 2 procedure without acute bowel obstruction or inflammation. Electronically Signed   By: Ashley Royalty M.D.   On: 09/30/2017 22:00    Procedures Procedures (including critical care time)  Medications Ordered in ED Medications  ondansetron (ZOFRAN) injection 4 mg (4 mg Intravenous Given 09/30/17 2111)  sodium chloride 0.9 % bolus 1,000 mL (0 mLs Intravenous Stopped 09/30/17 2240)  HYDROmorphone (DILAUDID) injection 1 mg (1 mg Intravenous Given 09/30/17 2111)  promethazine (PHENERGAN) injection 25 mg (25 mg Intravenous Given 09/30/17 2150)     Initial Impression / Assessment and Plan / ED Course  I have reviewed the triage vital signs and the nursing notes.  Pertinent labs & imaging results that were available during my care of the patient were reviewed by me and considered in my medical decision making (see chart for details).     Subcutaneous fluid seen on CT scan is chronic.  Work up is otherwise  negative for anything acute.  The pt is encouraged to avoid cocaine.  Return if worse.  Final Clinical Impressions(s) / ED Diagnoses   Final diagnoses:  Generalized abdominal pain  Non-intractable vomiting with nausea, unspecified vomiting type  Cocaine abuse [facility])    ED Discharge Orders    None       Isla Pence, MD 10/01/17 0002

## 2017-09-30 NOTE — Discharge Instructions (Addendum)
Do not use cocaine. 

## 2017-10-01 MED ORDER — PROMETHAZINE HCL 25 MG PO TABS: 25.0000 mg | ORAL_TABLET | Freq: Three times a day (TID) | ORAL | 0 refills | Status: DC | PRN

## 2017-11-10 ENCOUNTER — Emergency Department (HOSPITAL_COMMUNITY)
Admission: EM | Admit: 2017-11-10 | Discharge: 2017-11-10 | Disposition: A | Discharge status: Home / Self Care | Payer: Medicare Other | Attending: Emergency Medicine | Admitting: Emergency Medicine

## 2017-11-10 ENCOUNTER — Encounter (HOSPITAL_COMMUNITY): Payer: Self-pay | Admitting: Emergency Medicine

## 2017-11-10 ENCOUNTER — Emergency Department (HOSPITAL_COMMUNITY): Payer: Medicare Other

## 2017-11-10 ENCOUNTER — Other Ambulatory Visit: Payer: Self-pay

## 2017-11-10 DIAGNOSIS — D649 Anemia, unspecified: Secondary | ICD-10-CM

## 2017-11-10 DIAGNOSIS — R1033 Periumbilical pain: Secondary | ICD-10-CM | POA: Diagnosis not present

## 2017-11-10 DIAGNOSIS — F17210 Nicotine dependence, cigarettes, uncomplicated: Secondary | ICD-10-CM | POA: Insufficient documentation

## 2017-11-10 DIAGNOSIS — R1031 Right lower quadrant pain: Secondary | ICD-10-CM | POA: Diagnosis not present

## 2017-11-10 DIAGNOSIS — I10 Essential (primary) hypertension: Secondary | ICD-10-CM | POA: Diagnosis not present

## 2017-11-10 DIAGNOSIS — Z79899 Other long term (current) drug therapy: Secondary | ICD-10-CM | POA: Insufficient documentation

## 2017-11-10 DIAGNOSIS — R0602 Shortness of breath: Secondary | ICD-10-CM | POA: Diagnosis not present

## 2017-11-10 DIAGNOSIS — R1084 Generalized abdominal pain: Secondary | ICD-10-CM | POA: Diagnosis not present

## 2017-11-10 DIAGNOSIS — Z9884 Bariatric surgery status: Secondary | ICD-10-CM | POA: Insufficient documentation

## 2017-11-10 DIAGNOSIS — R52 Pain, unspecified: Secondary | ICD-10-CM | POA: Diagnosis not present

## 2017-11-10 LAB — CBC WITH DIFFERENTIAL/PLATELET
Basophils Absolute: 0 10*3/uL (ref 0.0–0.1)
Basophils Relative: 0 %
EOS ABS: 0.1 10*3/uL (ref 0.0–0.7)
EOS PCT: 1 %
HCT: 34 % — ABNORMAL LOW (ref 36.0–46.0)
Hemoglobin: 11.7 g/dL — ABNORMAL LOW (ref 12.0–15.0)
LYMPHS ABS: 4 10*3/uL (ref 0.7–4.0)
Lymphocytes Relative: 40 %
MCH: 27.1 pg (ref 26.0–34.0)
MCHC: 34.4 g/dL (ref 30.0–36.0)
MCV: 78.9 fL (ref 78.0–100.0)
MONO ABS: 0.6 10*3/uL (ref 0.1–1.0)
MONOS PCT: 6 %
Neutro Abs: 5.3 10*3/uL (ref 1.7–7.7)
Neutrophils Relative %: 53 %
PLATELETS: 386 10*3/uL (ref 150–400)
RBC: 4.31 MIL/uL (ref 3.87–5.11)
RDW: 18.1 % — ABNORMAL HIGH (ref 11.5–15.5)
WBC: 9.9 10*3/uL (ref 4.0–10.5)

## 2017-11-10 LAB — COMPREHENSIVE METABOLIC PANEL
ALBUMIN: 3.9 g/dL (ref 3.5–5.0)
ALK PHOS: 108 U/L (ref 38–126)
ALT: 32 U/L (ref 0–44)
AST: 30 U/L (ref 15–41)
Anion gap: 10 (ref 5–15)
BUN: 9 mg/dL (ref 6–20)
CALCIUM: 9 mg/dL (ref 8.9–10.3)
CO2: 24 mmol/L (ref 22–32)
CREATININE: 0.69 mg/dL (ref 0.44–1.00)
Chloride: 102 mmol/L (ref 98–111)
GFR calc non Af Amer: >60 mL/min (ref >60)
GLUCOSE: 89 mg/dL (ref 70–99)
Potassium: 3.3 mmol/L — ABNORMAL LOW (ref 3.5–5.1)
SODIUM: 136 mmol/L (ref 135–145)
Total Bilirubin: 0.6 mg/dL (ref 0.3–1.2)
Total Protein: 8.2 g/dL — ABNORMAL HIGH (ref 6.5–8.1)

## 2017-11-10 LAB — LIPASE, BLOOD: Lipase: 18 U/L (ref 11–51)

## 2017-11-10 MED ORDER — IOHEXOL 300 MG/ML  SOLN
75.0000 mL | Freq: Once | INTRAMUSCULAR | Status: AC | PRN
  Administered 2017-11-10: 75 mL via INTRAVENOUS

## 2017-11-10 MED ORDER — PROMETHAZINE HCL 25 MG/ML IJ SOLN
25.0000 mg | Freq: Once | INTRAMUSCULAR | Status: AC
  Administered 2017-11-10: 25 mg via INTRAVENOUS
  Filled 2017-11-10: qty 1

## 2017-11-10 MED ORDER — PROMETHAZINE HCL 25 MG RE SUPP: 25.0000 mg | Freq: Four times a day (QID) | RECTAL | 0 refills | Status: DC | PRN

## 2017-11-10 MED ORDER — IOPAMIDOL (ISOVUE-300) INJECTION 61%
50.0000 mL | Freq: Once | INTRAVENOUS | Status: AC | PRN
  Administered 2017-11-10: 30 mL via ORAL

## 2017-11-10 NOTE — ED Provider Notes (Signed)
CT consistent with chronic fluid collection in the abdominal wall.  Normal white blood cell count. Patient is comfortable.  On abdominal exam.  Advised to follow-up closely with general surgery.  Return precautions given.   Julianne Rice, MD 11/10/17 0800

## 2017-11-10 NOTE — ED Triage Notes (Signed)
Pt came in via EMS for abdominal pain with nausea and vomiting for two days. Pt also complains of shortness of breath. BP 131/95 with history of hight blood pressure. Pt has history of stroke x1 year ago with no deficits noted. Pt states the last time she vomited was last night at 2300. Pt denies bowel or bladder discomfort or abnormalities. Pt states the pain is in the RLQ. Pt states the pain is a constant cramping, burning, and sharp feeling.

## 2017-11-10 NOTE — ED Provider Notes (Signed)
[facility] EMERGENCY DEPARTMENT Provider Note   CSN: [id number] Arrival date & time: 11/10/17  0227     History   Chief Complaint Chief Complaint  Patient presents with  . Abdominal Pain    HPI Angel Price is a 52 y.o. female.  The history is provided by the patient.  She has a complicated history including hypertension, stroke, lupus, chronic abdominal pain, status post Billroth II gastric bypass and comes in because of a painful knot in the right lower abdomen with associated nausea and vomiting.  This is been present for 2 days.  She has not been able to hold anything down during that time.  However, she has had normal bowel movements and has been passing flatus.  She rates pain at 10/10.  Nothing makes it better, nothing makes it worse.  She has not taken anything for pain.  She denies fever or chills.  She has had similar problems on the left in the past.  Of note, patient does admit to powdered cocaine use 3 days ago.  Past Medical History:  Diagnosis Date  . Abscess    soft tissue  . Adrenal mass ([place])   . Alcohol abuse   . Anxiety   . Blood transfusion without reported diagnosis   . Chronic abdominal pain   . Chronic wound infection of abdomen   . Colon polyp    colonoscopy 04/2014  . Depression   . Diverticulosis    colonoscopy 04/2014 moderat pan colonic  . Drug-seeking behavior   . Gastritis    EGD 05/2014  . Gastroparesis Nov 2015  . GERD (gastroesophageal reflux disease)   . Hemorrhoid    internal large  . Hiatal hernia   . History of Billroth II operation   . Hypertension   . Lung nodule    CT 02/2014 needs repeat 1 month  . Lung nodule < 6cm on CT 04/25/2014  . Lupus ([place])   . Malingering   . Nausea and vomiting    chronic, recurrent  . Pancreatitis   . Schatzki's ring    patent per EGD 04/2014  . Sickle cell trait ([place])   . Suicide attempt ([place])   . Thyroid disease 2000   overactive, radiation    Patient Active Problem List   Diagnosis  Date Noted  . Hemorrhoidal skin tag   . SBO (small bowel obstruction) ([place])   . Non-intractable vomiting with nausea   . Folate deficiency 05/11/2017  . Abdominal wall fistula   . Cellulitis of abdominal wall 01/02/2017  . Acute left hemiparesis ([place]) 12/05/2016  . Malingering 12/05/2016  . Weakness of left lower extremity 12/02/2016  . CVA (cerebral vascular accident) ([place]) 11/23/2016  . Bright red rectal bleeding 08/22/2016  . Hypotension due to blood loss   . Acute GI bleeding 05/24/2016  . Dieulafoy lesion of duodenum   . History of Billroth II operation   . Gastrointestinal hemorrhage 05/22/2016  . Absolute anemia   . Acute pancreatitis 10/01/2015  . Abnormal CT scan of lung 10/01/2015  . Alcohol intoxication ([place])   . Left-sided weakness   . Psychosomatic factor in physical condition   . Upper GI bleed   . Diverticulosis of colon with hemorrhage   . Chronic wound infection of abdomen 12/22/2014  . Thyroid disease 12/22/2014  . HTN (hypertension) 12/22/2014  . Ataxia 11/01/2014  . Hemorrhoids 04/26/2014  . Lung nodule 04/26/2014  . Diverticulosis   . Gastritis   . Hiatal hernia   .  Schatzki's ring   . Acute blood loss anemia 04/25/2014  . Sinus tachycardia 04/25/2014  . Hypokalemia 04/25/2014  . Hematemesis with nausea 04/25/2014  . Lung nodule < 6cm on CT 04/25/2014  . Intractable nausea and vomiting 04/24/2014  . Gastroparesis   . Abdominal pain 04/01/2014  . Chronic abdominal pain 01/21/2014  . Gastroenteritis 12/10/2013  . Chronic abdominal wound infection 08/16/2013  . MDD (major depressive disorder), recurrent episode, severe ([place]) 06/27/2013  . Wrist laceration 06/24/2013  . Diarrhea 05/07/2013  . Rectal bleeding 05/07/2013  . Abnormal LFTs 05/07/2013  . Adrenal mass, left ([place]) 05/07/2013  . Abdominal wall abscess at site of surgical wound 04/19/2013  . Abdominal wall abscess 04/18/2013  . Frequent headaches 03/30/2013  . Sleep difficulties  03/30/2013  . Essential hypertension, benign 03/30/2013  . History of cocaine abuse 03/16/2013  . History of schizoaffective disorder 03/16/2013  . Bipolar disorder ([place]) 03/16/2013  . Personality disorder ([place]) 03/16/2013  . Tobacco abuse 03/16/2013  . Alcohol abuse 03/16/2013  . Palpitations 03/16/2013  . Poor vision 03/16/2013  . History of gastric bypass 03/16/2013  . Status post hysterectomy with oophorectomy 03/16/2013  . Hypothyroid 03/16/2013  . Lupus ([place]) 03/16/2013    Past Surgical History:  Procedure Laterality Date  . ABDOMINAL HYSTERECTOMY  2013   [place]  . ABDOMINAL SURGERY    . ADRENALECTOMY Right   . AGILE CAPSULE N/A 01/05/2015   Procedure: AGILE CAPSULE;  Surgeon: Daneil Dolin, MD;  Location: [facility] ENDO SUITE;  Service: Endoscopy;  Laterality: N/A;  0700  . Billroth II procedure      [place], first 2000, 2005/2006.  Marland Kitchen BIOPSY  05/20/2013   Procedure: BIOPSIES OF ASCENDING AND SIGMOID COLON;  Surgeon: Daneil Dolin, MD;  Location: [facility] ORS;  Service: Endoscopy;;  . BIOPSY  04/26/2014   Procedure: BIOPSIES;  Surgeon: Danie Binder, MD;  Location: [facility] ORS;  Service: Endoscopy;;  . CHOLECYSTECTOMY    . COLONOSCOPY     in [place]  . COLONOSCOPY WITH PROPOFOL N/A 05/20/2013   Dr.Rourk- inadequate prep, normal appearing rectum, grossly normal colon aside from pancolonic diverticula, normal terminal ileum bx= unremarkable colonic mucosa. Due for early interval 2016.   Marland Kitchen COLONOSCOPY WITH PROPOFOL N/A 04/26/2014   JOA:CZYSAY ileum/one colon polyp removed/moderate pan-colonic diverticulosis/large internal hemorrhoids  . COLONOSCOPY WITH PROPOFOL N/A 12/23/2014   Dr.Rourk- minimal internal hemorrhoids, pancolonic diverticulosis  . COLONOSCOPY WITH PROPOFOL N/A 08/23/2016   Procedure: COLONOSCOPY WITH PROPOFOL;  Surgeon: Danie Binder, MD;  Location: [facility] ENDO SUITE;  Service: Endoscopy;  Laterality: N/A;  . DEBRIDEMENT OF ABDOMINAL WALL ABSCESS N/A 02/08/2013   Procedure:  DEBRIDEMENT OF ABDOMINAL WALL ABSCESS;  Surgeon: Jamesetta So, MD;  Location: [facility] ORS;  Service: General;  Laterality: N/A;  . ESOPHAGOGASTRODUODENOSCOPY (EGD) WITH PROPOFOL N/A 05/20/2013   Dr.Rourk- s/p prior gastric surgery with normal esophagus, residual gastric mucosa and patent efferent limb  . ESOPHAGOGASTRODUODENOSCOPY (EGD) WITH PROPOFOL N/A 02/03/2014   Dr. Gala Romney:  s/p hemigastrectomy with retained gastric contents. Residual gastric mucosa and efferent limb appeared normal otherwise. Query gastroparesis.   Marland Kitchen ESOPHAGOGASTRODUODENOSCOPY (EGD) WITH PROPOFOL N/A 04/26/2014   TKZ:SWFUXNAT'F ring/small HH/mild non-erosive gasrtitis/normal anastomosis  . ESOPHAGOGASTRODUODENOSCOPY (EGD) WITH PROPOFOL N/A 12/23/2014   Dr.Rourk- s/p prior hemigastrctomy, active oozing from anastomotic suture site, hemostasis achieved  . ESOPHAGOGASTRODUODENOSCOPY (EGD) WITH PROPOFOL N/A 05/23/2016   Dr. 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  ESOPHAGOGASTRODUODENOSCOPY (EGD) WITH PROPOFOL N/A 05/11/2017   Procedure: ESOPHAGOGASTRODUODENOSCOPY (EGD) WITH PROPOFOL;  Surgeon: Daneil Dolin, MD;  Location: [facility] END[facility];  Service: Endoscopy;  Laterality: N/A;  . HEMORRHOID SURGERY N/A 06/18/2017   Procedure: THREE COLUMN EXTENSIVE HEMORRHOIDECTOMY;  Surgeon: Virl Cagey, MD;  Location: [facility] ORS;  Service: General;  Laterality: N/A;  . INCISION AND DRAINAGE ABSCESS N/A 01/06/2017   Procedure: INCISION AND DRAINAGE ABDOMINAL WALL ABSCESS;  Surgeon: Aviva Signs, MD;  Location: [facility] ORS;  Service: General;  Laterality: N/A;  . tendon repar Right    wrist  . WOUND EXPLORATION Right 06/24/2013   Procedure: exploration of traumatic wound right wrist;  Surgeon: Tennis Must, MD;  Location: [facility];  Service: Orthopedics;  Laterality: Right;     OB History    Gravida  4   Para  4   Term  4   Preterm      AB      Living    3     SAB      TAB      Ectopic      Multiple      Live Births               Home Medications    Prior to Admission medications   Medication Sig Start Date End Date Taking? Authorizing Provider  amLODipine (NORVASC) 10 MG tablet Take 0.5 tablets (5 mg total) by mouth daily. 01/03/17   Doreatha Lew, MD  atenolol (TENORMIN) 50 MG tablet Take 50 mg by mouth daily.    [provider]  docusate sodium (COLACE) 100 MG capsule Take 1 capsule (100 mg total) by mouth 2 (two) times daily. 06/18/17 06/18/18  Virl Cagey, MD  escitalopram (LEXAPRO) 20 MG tablet Take 20 mg daily by mouth.    [provider]  hydrocortisone (ANUSOL-HC) 2.5 % rectal cream Place rectally 2 (two) times daily. Patient not taking: Reported on 07/03/2017 05/23/17   Murlean Iba, MD  HYDROmorphone (DILAUDID) 2 MG tablet Take 1 tablet (2 mg total) by mouth every 6 (six) hours as needed for severe pain. 06/23/17 06/23/18  Aviva Signs, MD  lisinopril (PRINIVIL,ZESTRIL) 20 MG tablet Take 1 tablet (20 mg total) by mouth daily. 11/26/16 11/26/17  Reyne Dumas, MD  loperamide (IMODIUM) 2 MG capsule Take 1 capsule (2 mg total) by mouth 4 (four) times daily as needed for diarrhea or loose stools. 07/01/17   Noemi Chapel, MD  ondansetron (ZOFRAN ODT) 4 MG disintegrating tablet Take 1 tablet (4 mg total) by mouth every 8 (eight) hours as needed for nausea. 07/01/17   Noemi Chapel, MD  pantoprazole (PROTONIX) 40 MG tablet Take 1 tablet (40 mg total) by mouth 2 (two) times daily. 05/23/17 07/03/17  Murlean Iba, MD  promethazine (PHENERGAN) 25 MG suppository Place 1 suppository (25 mg total) rectally every 6 (six) hours as needed for nausea or vomiting. 07/02/17   Francine Graven, DO  promethazine (PHENERGAN) 25 MG tablet Take 1 tablet (25 mg total) by mouth every 8 (eight) hours as needed for nausea. 10/01/17   Isla Pence, MD    Family History Family History  Problem Relation Age of  Onset  . Brain cancer Son   . Schizophrenia Son   . Cancer Son        brain  . Lung cancer Father   . Cancer Father        mets  . Drug abuse Mother   .  Breast cancer Maternal Aunt   . Bipolar disorder Maternal Aunt   . Drug abuse Maternal Aunt   . Colon cancer Maternal Grandmother        late 36s, early 39s  . Drug abuse Sister   . Drug abuse Brother   . Bipolar disorder Paternal Grandfather   . Bipolar disorder Cousin   . Liver disease Neg Hx     Social History Social History   Tobacco Use  . Smoking status: Current Every Day Smoker    Packs/day: 0.50    Years: 35.00    Pack years: 17.50    Types: Cigarettes  . Smokeless tobacco: Never Used  Substance Use Topics  . Alcohol use: No    Alcohol/week: 0.0 standard drinks    Comment: 1 beer 4 days ago  . Drug use: No    Types: Cocaine    Comment: quit 09/2012     Allergies   Codeine; Morphine and related; and Reglan [metoclopramide]   Review of Systems Review of Systems  All other systems reviewed and are negative.    Physical Exam Updated Vital Signs BP 112/85   Pulse 80   Temp 98.8 F (37.1 C) (Oral)   Resp 16   Ht 5\' 3"  (1.6 m)   Wt 54.4 kg   SpO2 93%   BMI 21.26 kg/m   Physical Exam  Nursing note and vitals reviewed.  52 year old female, resting comfortably and in no acute distress. Vital signs are normal. Oxygen saturation is 93%, which is normal. Head is normocephalic and atraumatic. PERRLA, EOMI. Oropharynx is clear. Neck is nontender and supple without adenopathy or JVD. Back is nontender and there is no CVA tenderness. Lungs are clear without rales, wheezes, or rhonchi. Chest is nontender. Heart has regular rate and rhythm without murmur. Abdomen has numerous surgical scars present.  Abdomen is soft with 2 indurated masses in the periumbilical area.  On the left, the mass is nontender.  On the right it is very tender.  There are no other masses or hepatosplenomegaly and peristalsis is  normoactive. Extremities have no cyanosis or edema, full range of motion is present. Skin is warm and dry without rash. Neurologic: Mental status is normal, cranial nerves are intact, there are no motor or sensory deficits.  ED Treatments / Results  Labs (all labs ordered are listed, but only abnormal results are displayed) Labs Reviewed  COMPREHENSIVE METABOLIC PANEL - Abnormal; Notable for the following components:      Result Value   Potassium 3.3 (*)    Total Protein 8.2 (*)    All other components within normal limits  CBC WITH DIFFERENTIAL/PLATELET - Abnormal; Notable for the following components:   Hemoglobin 11.7 (*)    HCT 34.0 (*)    RDW 18.1 (*)    All other components within normal limits  LIPASE, BLOOD  RAPID URINE DRUG SCREEN, HOSP PERFORMED    Radiology No results found.  Procedures Procedures  Medications Ordered in ED Medications  promethazine (PHENERGAN) injection 25 mg (25 mg Intravenous Given 11/10/17 0526)  iopamidol (ISOVUE-300) 61 % injection 50 mL (30 mLs Oral Contrast Given 11/10/17 0630)  iohexol (OMNIPAQUE) 300 MG/ML solution 75 mL (75 mLs Intravenous Contrast Given 11/10/17 0631)     Initial Impression / Assessment and Plan / ED Course  I have reviewed the triage vital signs and the nursing notes.  Pertinent labs & imaging results that were available during my care of the patient were reviewed  by me and considered in my medical decision making (see chart for details).  Tender mass in the right periumbilical area.  Old records are reviewed, and she has several ED visits this year for her chronic abdominal pain.  She has had several CT scans with most recent scan on July 16 showing a thick rimmed periumbilical subcutaneous fluid collection.  This does seem to correlate with where she has her tenderness now.  I have low clinical index of suspicion for abdominal wall hernia.  However, I do not see any descriptions of these masses on prior ED provider  notes, so I feel obliged to repeat CT scan to evaluate this lesion.    WBC is normal with normal differential.  Mild anemia is present not significantly changed from baseline.  CT does show a fluid collection and I believe I see some enhancement of the wall.  Official report is pending.  Case is signed out to Dr. Lita Mains.  Final Clinical Impressions(s) / ED Diagnoses   Final diagnoses:  Periumbilical abdominal pain  Normochromic normocytic anemia    ED Discharge Orders    None       Delora Fuel, MD 65/78/46 [phone]

## 2017-11-13 ENCOUNTER — Other Ambulatory Visit: Payer: Self-pay

## 2017-11-13 DIAGNOSIS — I10 Essential (primary) hypertension: Secondary | ICD-10-CM | POA: Diagnosis not present

## 2017-11-13 DIAGNOSIS — G8929 Other chronic pain: Secondary | ICD-10-CM | POA: Diagnosis not present

## 2017-11-13 DIAGNOSIS — R1900 Intra-abdominal and pelvic swelling, mass and lump, unspecified site: Secondary | ICD-10-CM | POA: Diagnosis not present

## 2017-11-13 DIAGNOSIS — E785 Hyperlipidemia, unspecified: Secondary | ICD-10-CM | POA: Diagnosis not present

## 2017-11-13 DIAGNOSIS — Z79899 Other long term (current) drug therapy: Secondary | ICD-10-CM | POA: Diagnosis not present

## 2017-11-13 DIAGNOSIS — K219 Gastro-esophageal reflux disease without esophagitis: Secondary | ICD-10-CM | POA: Diagnosis not present

## 2017-11-13 DIAGNOSIS — L02211 Cutaneous abscess of abdominal wall: Secondary | ICD-10-CM | POA: Diagnosis not present

## 2017-11-13 DIAGNOSIS — G894 Chronic pain syndrome: Secondary | ICD-10-CM | POA: Diagnosis not present

## 2017-11-13 NOTE — Patient Outreach (Signed)
[facility]) Care Management  11/13/2017  Angel Price [birth year] [id number]    Telephone Screen Referral Date : 11/11/2017 Referral Source: [facility] ED Census Referral Reason: Ed Utilization Insurance:[employer]  Outreach attempt # 1 To patient. Spoke with the mother. She stated that the patient was not there.  In attempt to leave name and phone number the mother stated she was in the bed and unable to write the information down.  She stated that number did not show on the phone.  When asked  when would be a better time to call the patient she stated that she is not here and her cell phone is not working and she was unsure when she would get another cell phone.  Plan: RN Health Coach will mail the patient a letter and pamphlet. I will wait for her to respond.  If no response within 10 business day  I will close the case.   Lazaro Arms RN, BSN, [place] Direct Dial:  [phone] Fax: [phone]

## 2017-11-25 ENCOUNTER — Other Ambulatory Visit: Payer: Self-pay

## 2017-11-25 NOTE — Patient Outreach (Signed)
[facility]) Care Management  11/25/2017  MADIE CAHN [birth year] [id number]    Multiple attempts to establish contact with patient without success. No response from calls or letter mailed to patient.   Plan: Case is being closed at this time.  Lazaro Arms RN, BSN, [place] Direct Dial:  [phone]  Fax: [phone]

## 2017-12-07 IMAGING — DX DG FOREARM 2V*R*
3 series · 3 of 3 positions shown · non-contrast
Comparison: None.

CLINICAL DATA: Pain in forearm and wrist after fall today.

EXAM:
RIGHT FOREARM - 2 VIEW

[forearm ap]
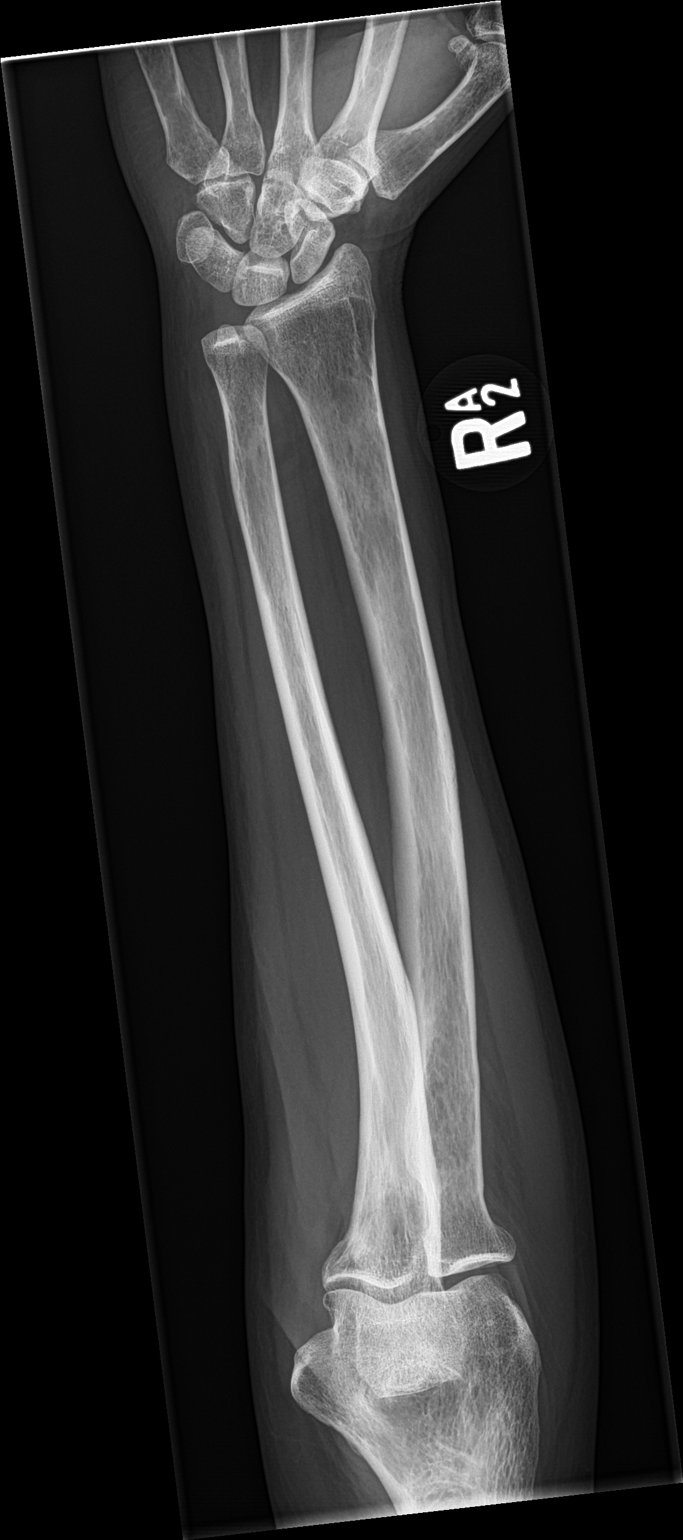

[forearm lat (1 of 2)]
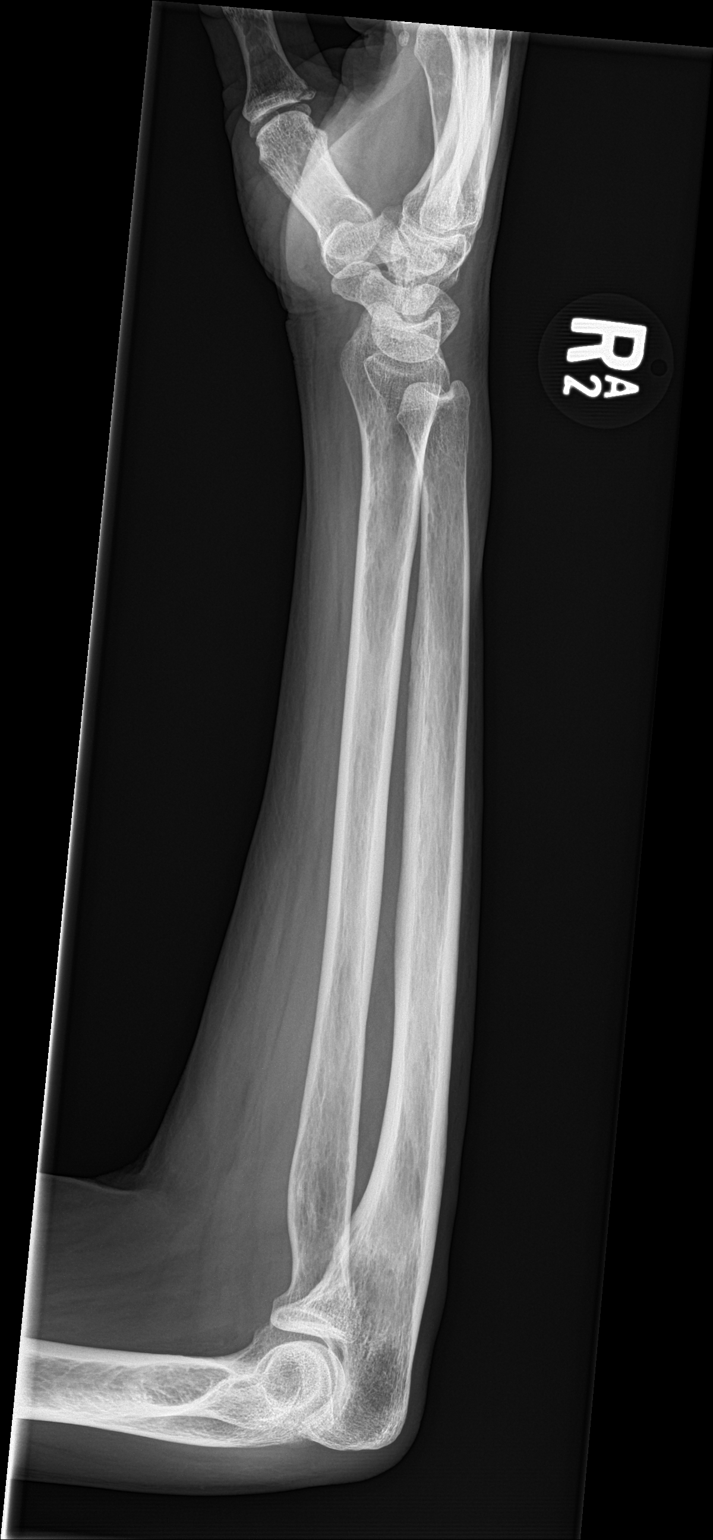

[forearm lat (2 of 2)]
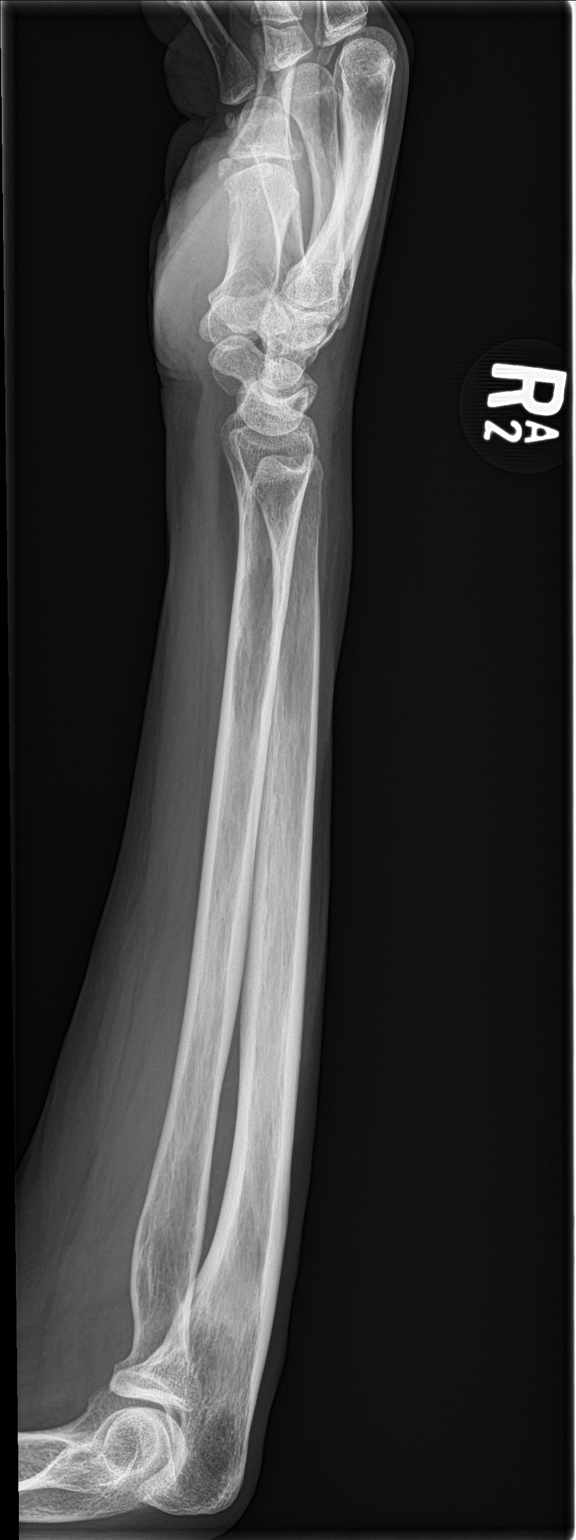

[3 of 3 positions shown; findings below may reference images not displayed]

FINDINGS: No acute fracture or dislocation. No elbow joint effusion. No
definite soft tissue swelling.
IMPRESSION: No acute osseous abnormality.

## 2017-12-25 IMAGING — RF DG UGI W/ HIGH DENSITY W/KUB
12 of 18 series · 14 of 24 positions shown · non-contrast
Comparison: CT abdomen and pelvis 05/22/2016

CLINICAL DATA: Non intractable vomiting with nausea, history of
Billroth II for ulcer disease, evaluate anastomosis, history lupus,
hypertension, gastritis

EXAM:
UPPER GI SERIES WITH KUB
TECHNIQUE: After obtaining a scout radiograph a routine upper GI series was
performed using thin barium. Patient swallowed effervescent granules
in anticipation of performing a double contrast upper GI exam but
experienced significant nausea and vomiting and was unable to retain
in the air.
FLUOROSCOPY TIME:  Fluoroscopy Time:  2 minutes 24 seconds
Radiation Exposure Index (if provided by the fluoroscopic device):
39.8 mGy
Number of Acquired Spot Images: 2 plus multiple screen captures
during fluoroscopy

[Series 1: t abdomen supine · 0.15mm/px · 1 of 1 slices shown]
[im 1/1]
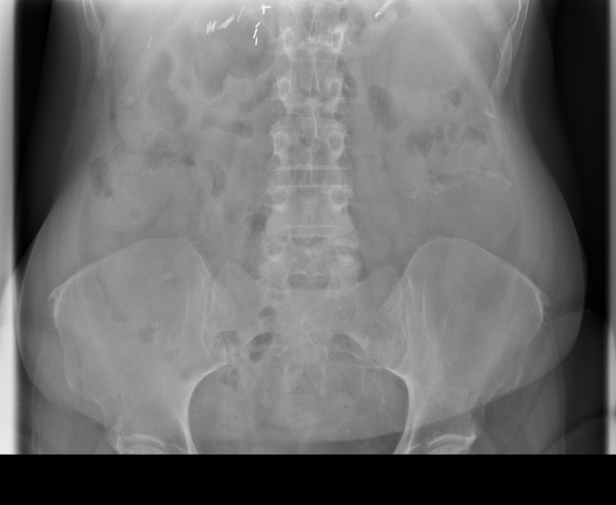

[Series 3: cp_standard · 0.19mm/px · 1 of 34 frames shown (1 of 11)]
[frame 18/34]
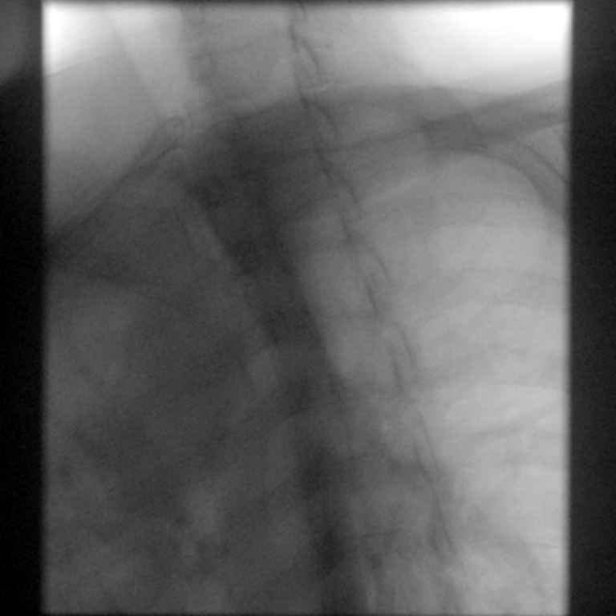

[Series 4: cp_standard · 0.19mm/px · 1 of 32 frames shown (2 of 11)]
[frame 9/32]
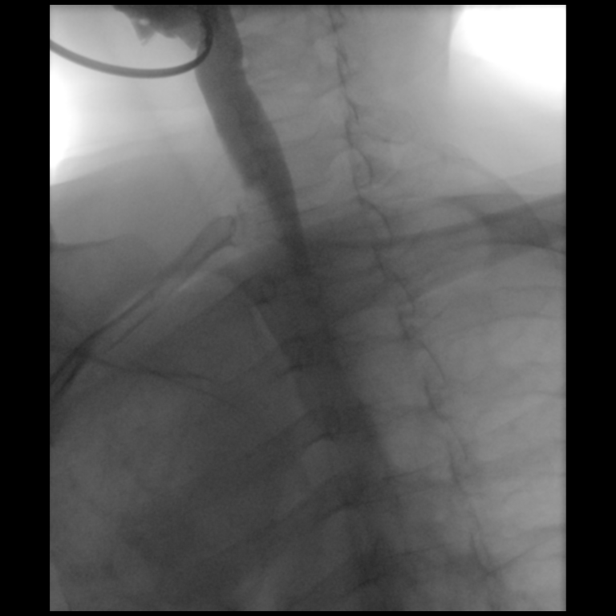

[Series 5: cp_standard · 0.19mm/px · 2 of 17 frames shown (3 of 11)]
[frame 3/17]
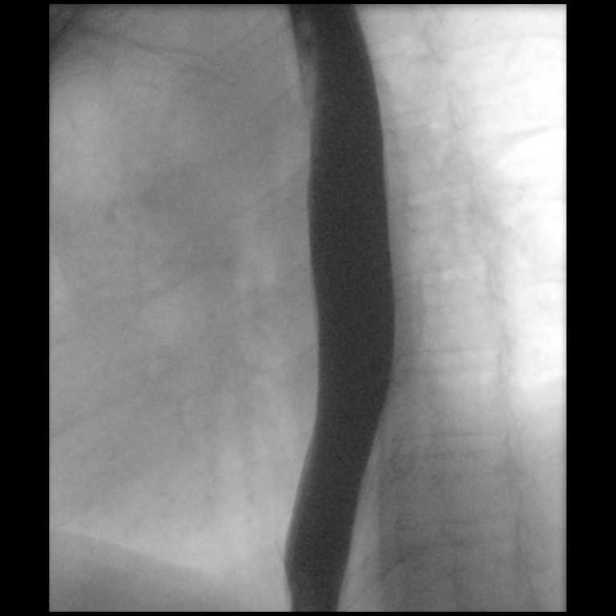
[frame 6/17]
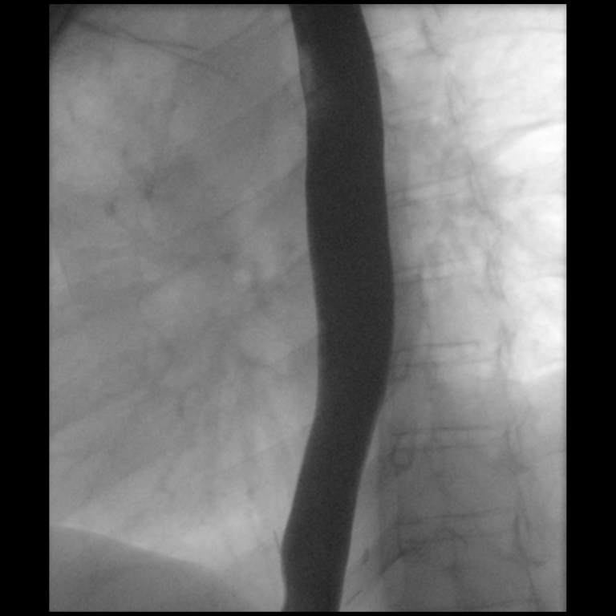

[Series 6: cp_standard · 0.19mm/px · 2 of 51 frames shown (4 of 11)]
[frame 8/51]
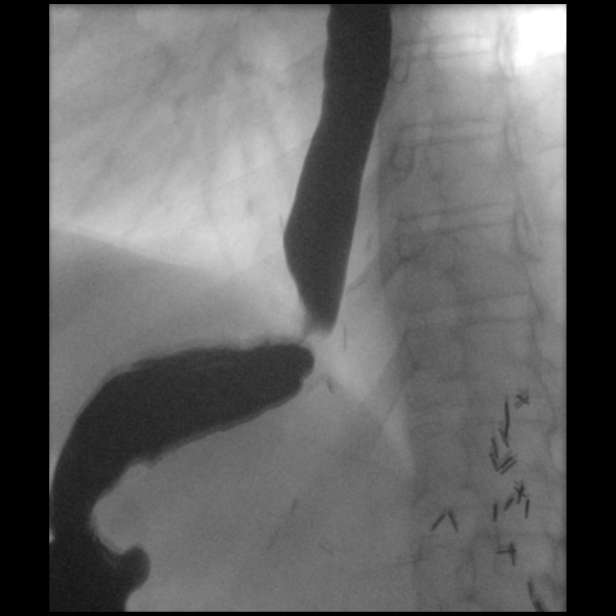
[frame 48/51]
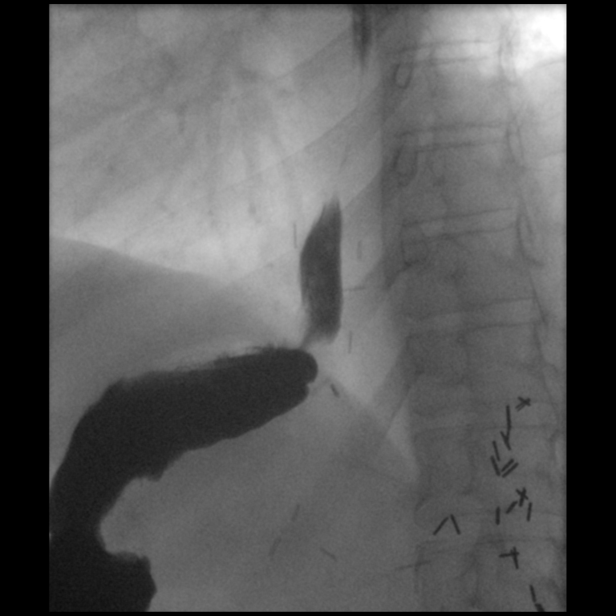

[Series 8: cp_standard · 0.19mm/px · 1 of 1 slices shown (5 of 11)]
[im 1/1]
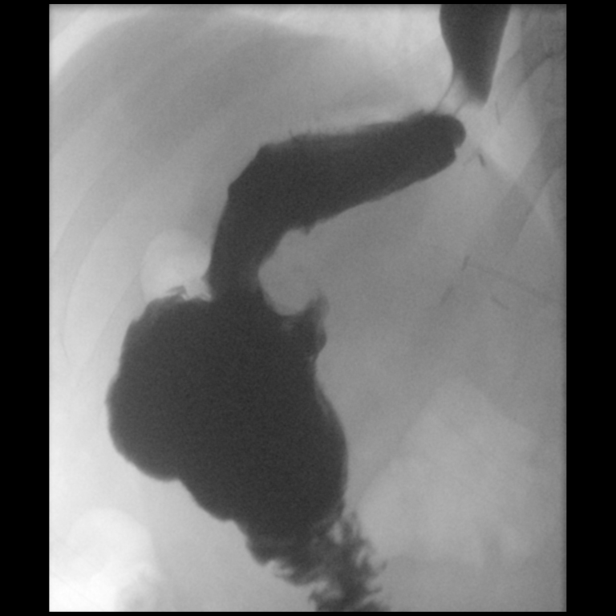

[Series 11: cp_standard · 0.19mm/px · 1 of 1 slices shown (6 of 11)]
[im 1/1]
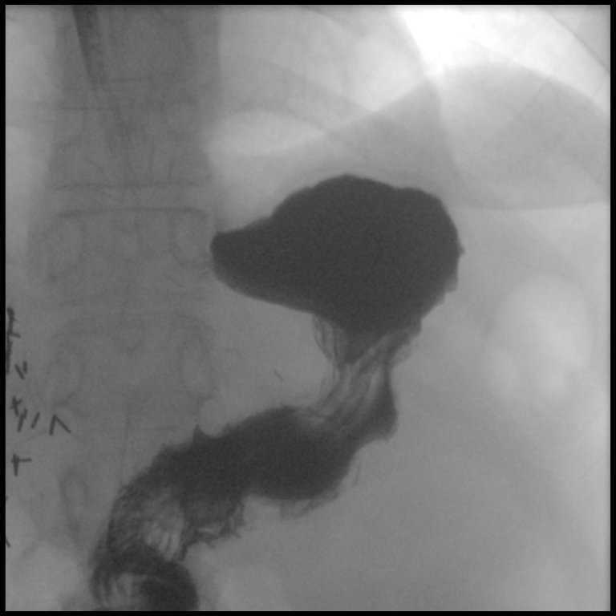

[Series 14: cp_standard · 0.19mm/px · 1 of 1 slices shown (7 of 11)]
[im 1/1]
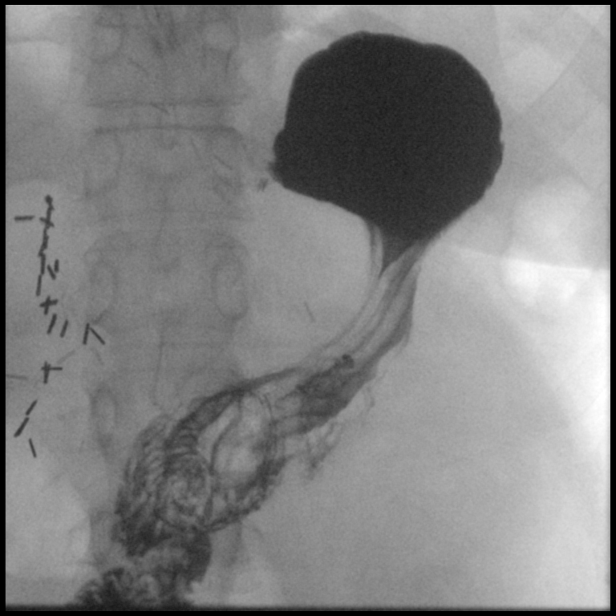

[Series 17: cp_standard · 0.19mm/px · 1 of 1 slices shown (8 of 11)]
[im 1/1]
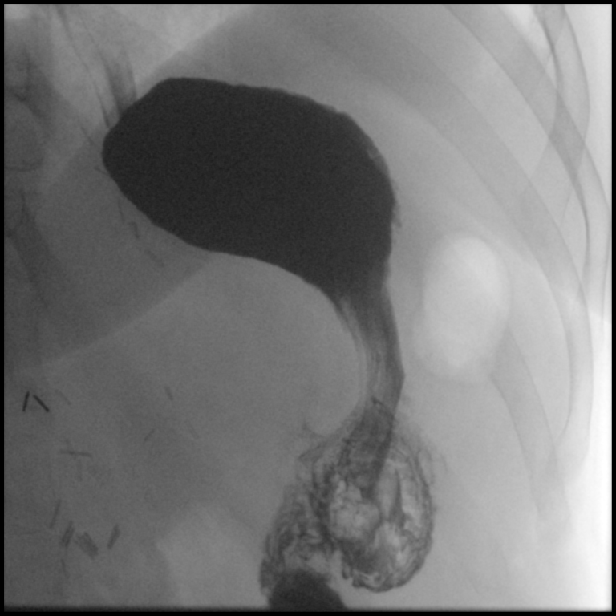

[Series 18: cp_standard · 0.19mm/px · 1 of 1 slices shown (9 of 11)]
[im 1/1]
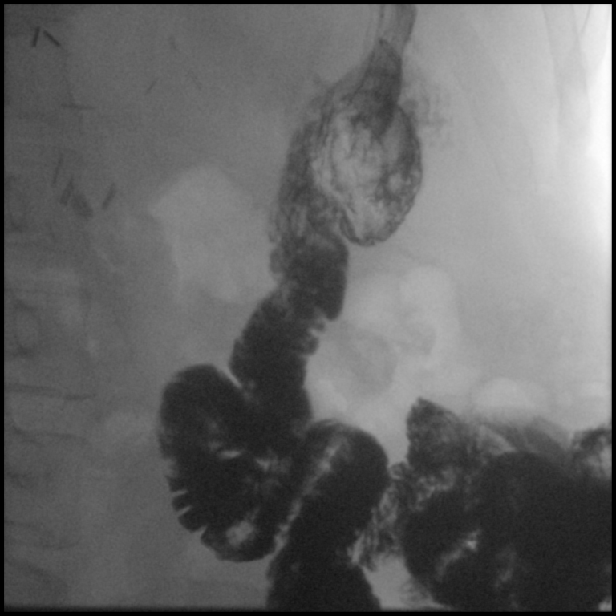

[Series 21: cp_standard · 0.20mm/px · 1 of 1 slices shown (10 of 11)]
[im 1/1]
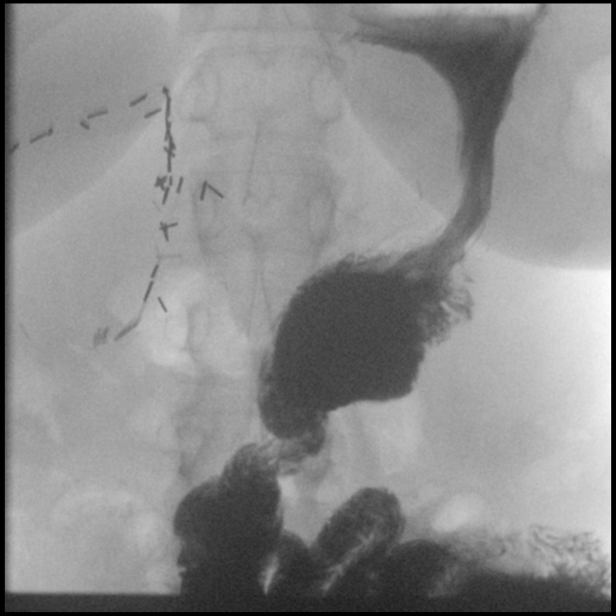

[Series 24: cp_standard · 0.20mm/px · 1 of 1 slices shown (11 of 11)]
[im 1/1]
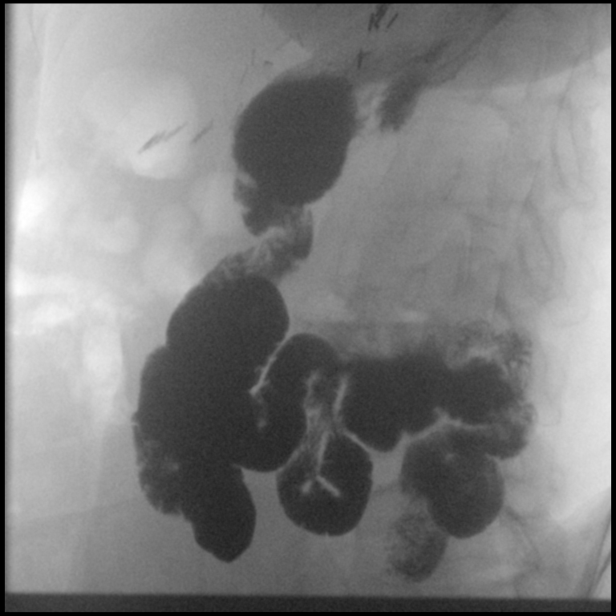

[14 of 24 positions shown; findings below may reference images not displayed]

FINDINGS: Surgical clips in RIGHT upper quadrant from cholecystectomy.

Additional surgical clips in upper abdomen and in LEFT mid abdomen
from prior Billroth 2 procedure.

Additional endoscopic biopsy clips project over the stomach.

Bones appear demineralized.

Nonobstructive bowel gas pattern.

Normal esophageal distention and motility.

No esophageal mass or stricture

Smooth appearance of esophageal mucosa without irregularity or
ulceration.

Proximal to mid stomach distends normally with normal rugal fold
pattern.

No definite mucosal thickening or mass.

Patent gastrojejunostomy anastomosis with free passage of contrast
into jejunum.

No anastomotic stricture or ulceration.

Visualize jejunal loops unremarkable.
IMPRESSION: Post Billroth 2 with patent gastrojejunostomy anastomosis.

No acute abnormalities.

## 2017-12-28 IMAGING — DX DG ABDOMEN ACUTE W/ 1V CHEST
3 series · 3 of 3 positions shown · non-contrast
Comparison: Chest radiograph May 22, 2016; CT abdomen and pelvis
May 22, 2016

CLINICAL DATA: Recent rectal bleeding

EXAM:
DG ABDOMEN ACUTE W/ 1V CHEST

[chest pa]
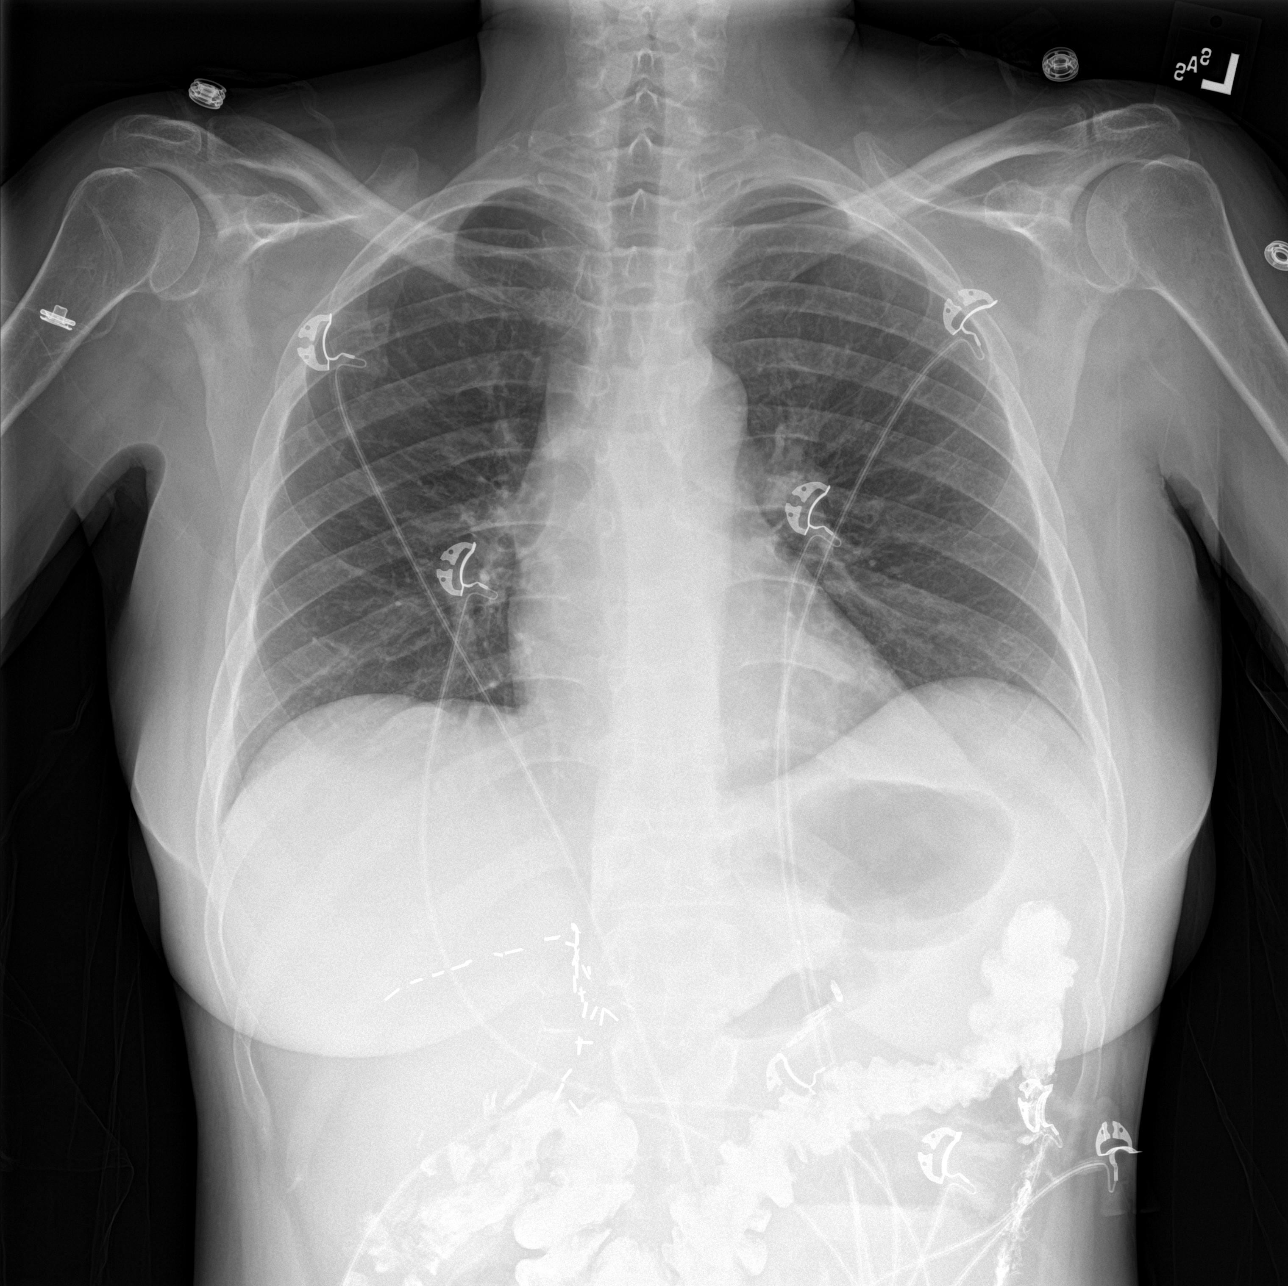

[abdomen erect]
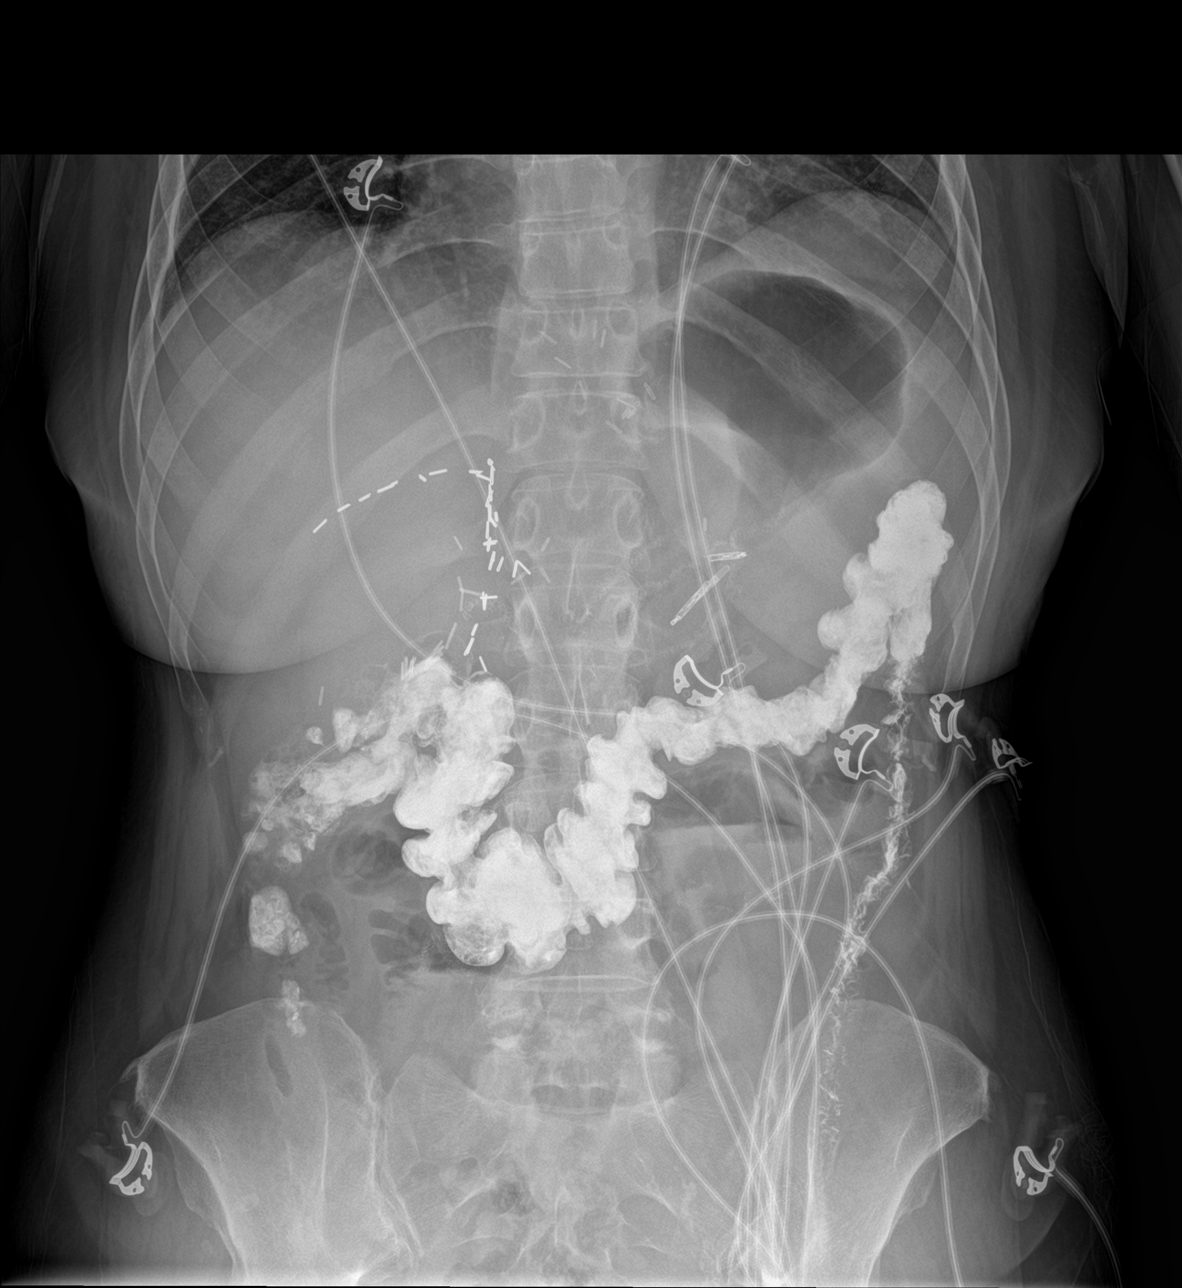

[abdomen supine]
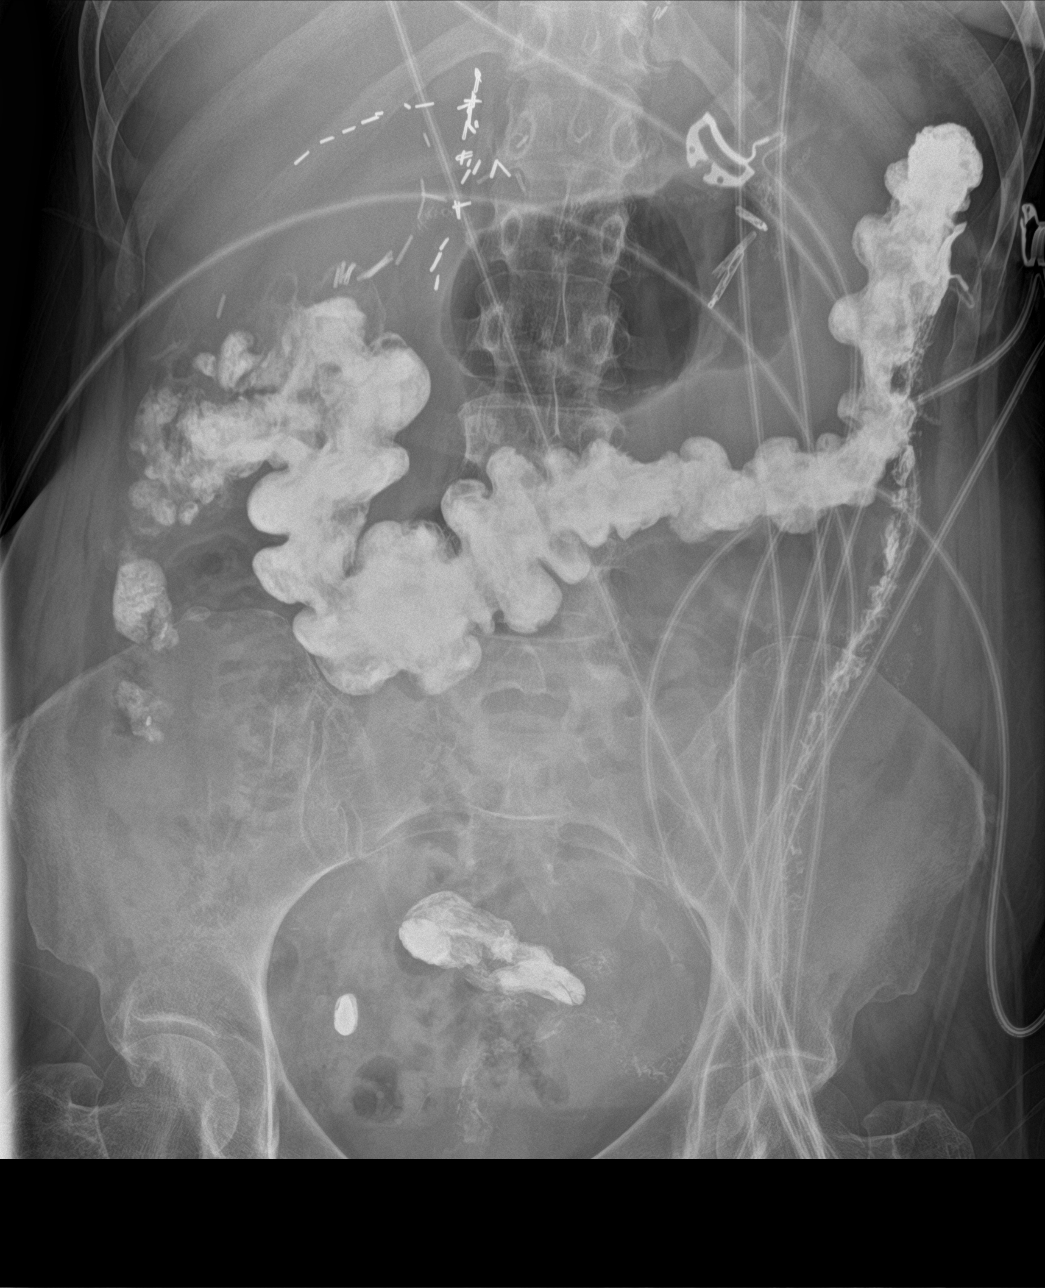

[3 of 3 positions shown; findings below may reference images not displayed]

FINDINGS: PA chest: Lungs are clear. Heart size and pulmonary vascularity are
normal. No adenopathy.

Supine and upright abdomen: There is moderate contrast in the colon.
There is no bowel dilatation or air-fluid level to suggest bowel
obstruction. No free air. There are multiple surgical clips in the
upper abdomen.
IMPRESSION: No bowel dilatation or air-fluid level to suggest bowel obstruction.
No free air. Moderate contrast is seen in the colon. Extensive
postoperative change present in the abdomen. Lungs clear.

## 2018-01-23 IMAGING — DX DG ABDOMEN ACUTE W/ 1V CHEST
3 series · 4 of 4 positions shown · non-contrast
Comparison: Chest in two views abdomen 08/22/2016.

CLINICAL DATA: Epigastric pain, rectal bleeding, nausea, vomiting
and dizziness for 3 days.

EXAM:
DG ABDOMEN ACUTE W/ 1V CHEST

[chest pa]
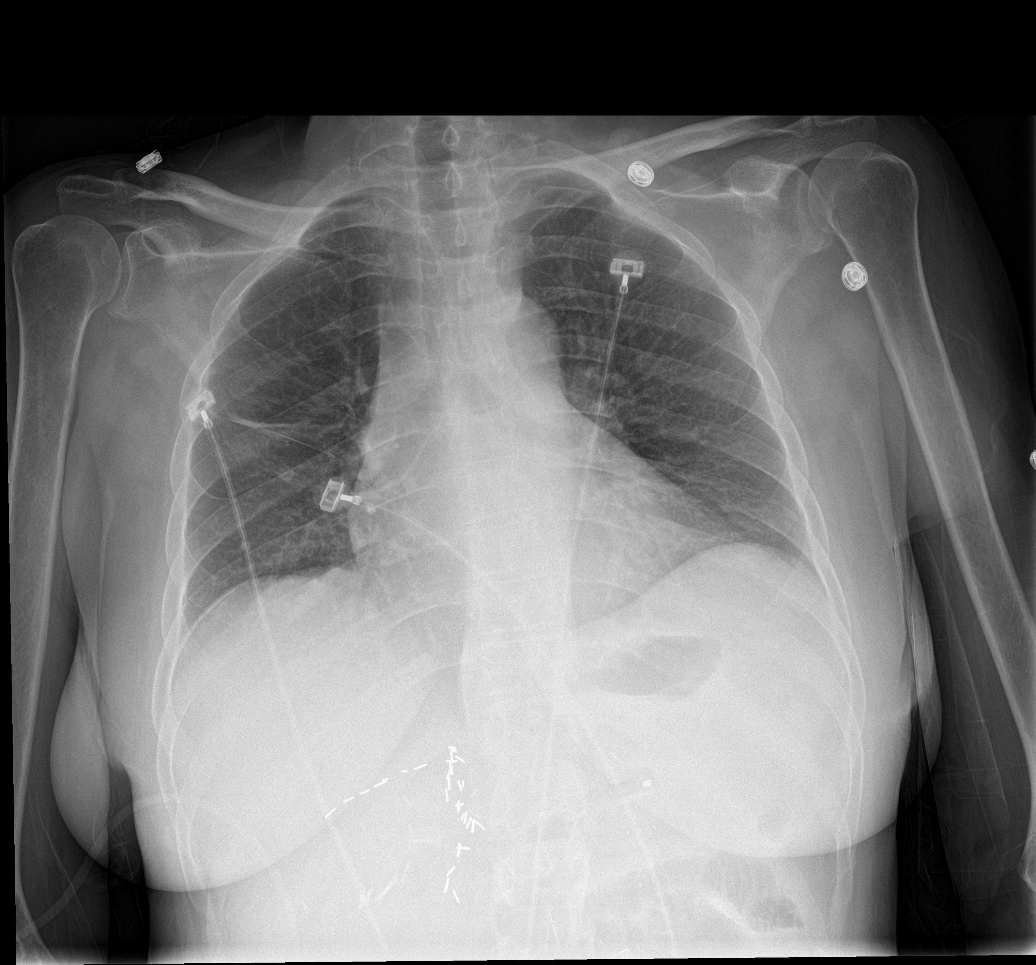

[abdomen erect]
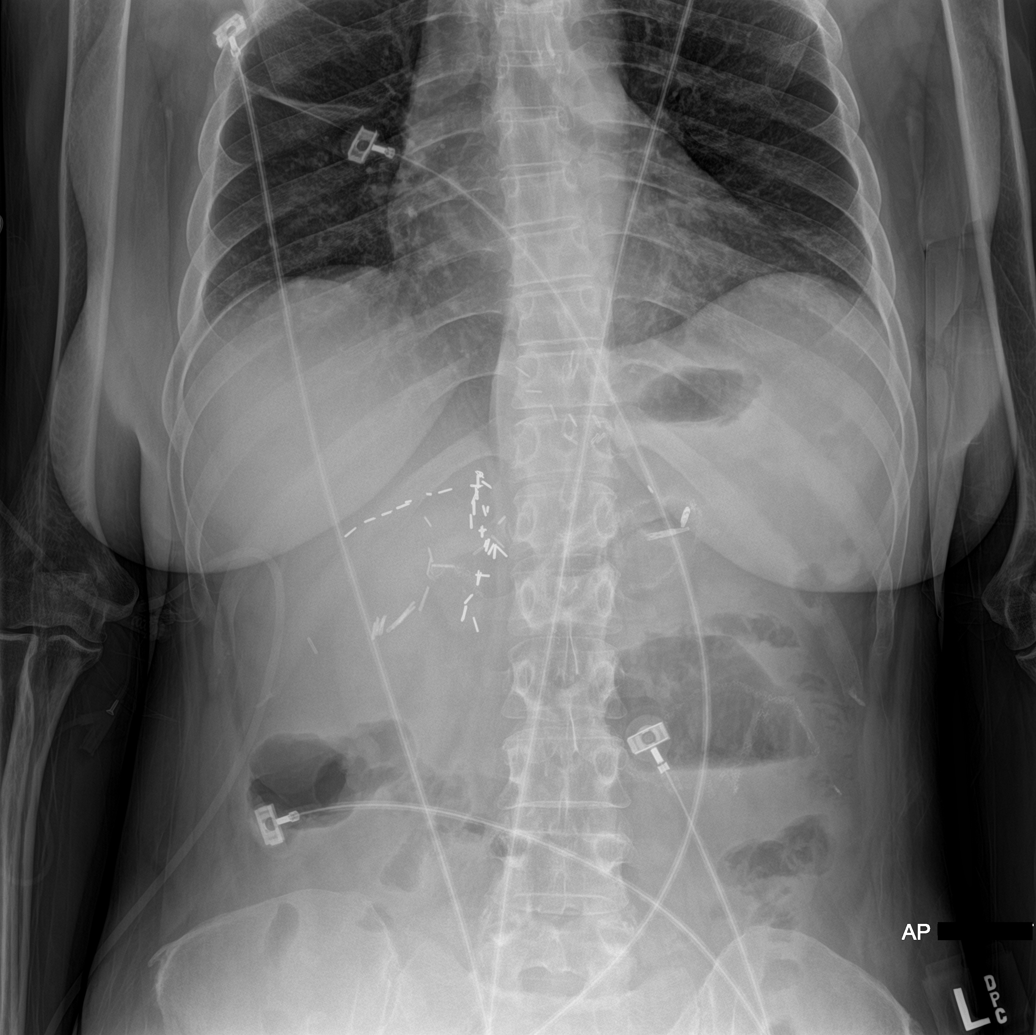

[Series 3: abdomen supine · 0.14mm/px · 2 of 2 slices shown]
[im 1/2]
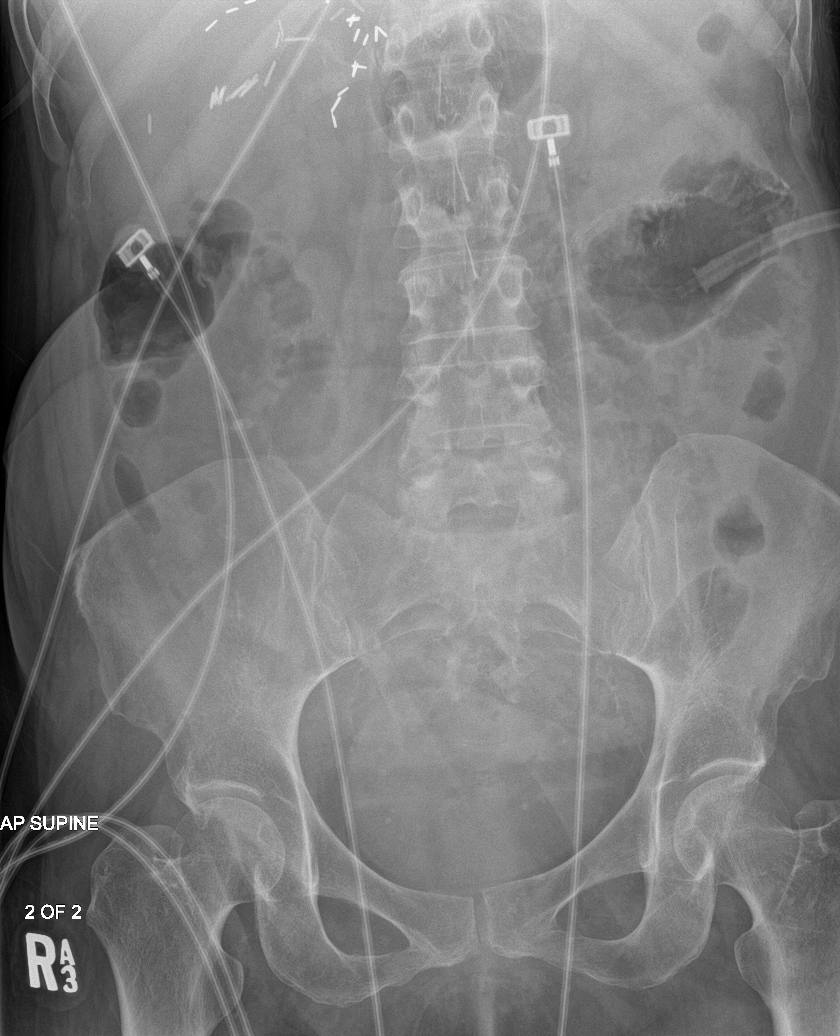
[im 2/2]
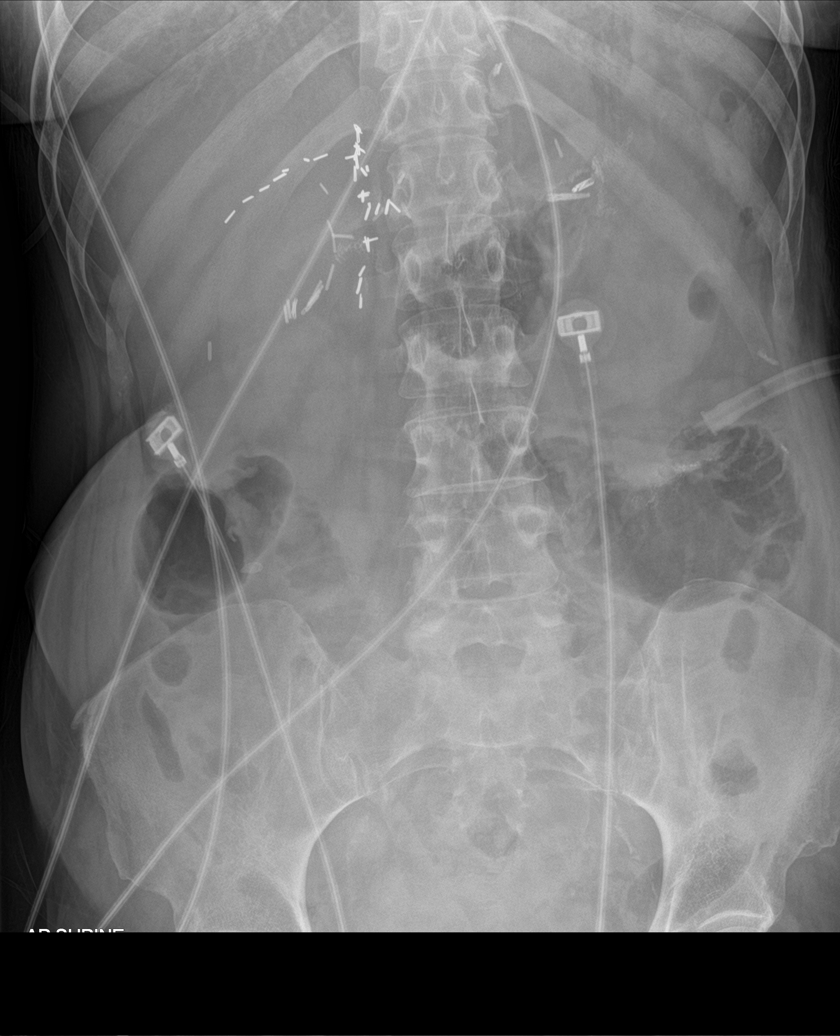

[4 of 4 positions shown; findings below may reference images not displayed]

FINDINGS: Single-view of the chest demonstrates a small focus of linear
atelectasis in the right mid lung zone. Lungs are otherwise clear.
Heart size is normal. No pneumothorax or pleural effusion.

Two views the abdomen show no free intraperitoneal air. Multiple
surgical clips and suture material are seen in the abdomen. The
bowel gas pattern is nonobstructive.
IMPRESSION: No acute finding chest or abdomen.

## 2018-01-29 IMAGING — CT CT ABD-PELV W/ CM
2 of 4 series · 16 of 46 positions shown, 18 images · IV contrast (Isovue)
Comparison: Multiple prior CT scans. The most recent is May 2016.

CLINICAL DATA: Upper and mid abdominal pain with nausea and
vomiting this morning.

EXAM:
CT ABDOMEN AND PELVIS WITH CONTRAST
TECHNIQUE: Multidetector CT imaging of the abdomen and pelvis was performed
using the standard protocol following bolus administration of
intravenous contrast.
CONTRAST:  100mL [id number] IOPAMIDOL ([id number]) INJECTION 61%

[Series 2: axial st · axial · 0.68mm/px · z∈[-216,+199]mm · 13 of 93 slices shown, 15 images]
[im 5/93  soft-tissue]
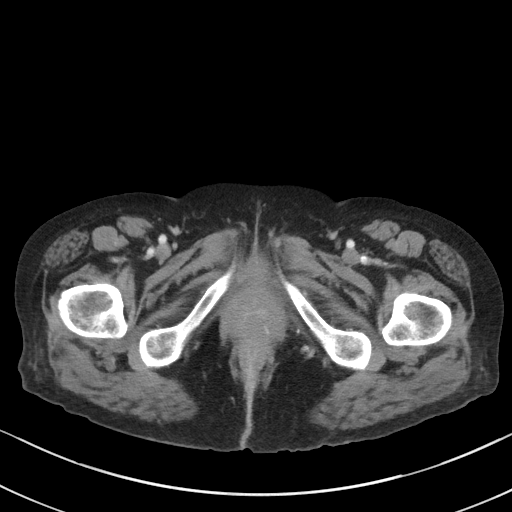
[im 5/93  bone]
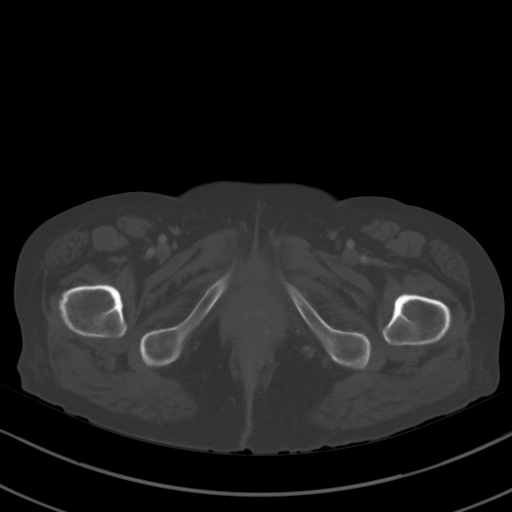
[im 14/93  soft-tissue]
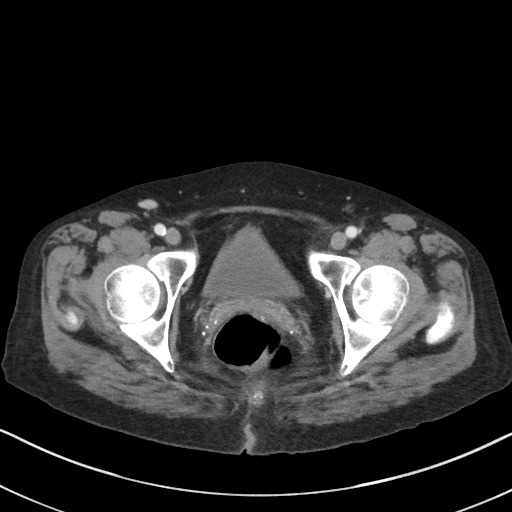
[im 19/93  soft-tissue]
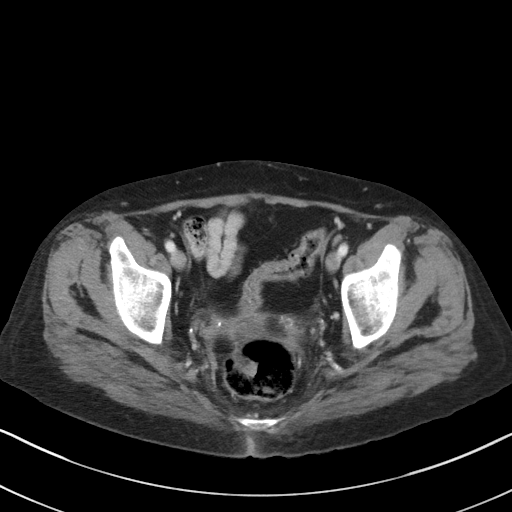
[im 28/93  soft-tissue]
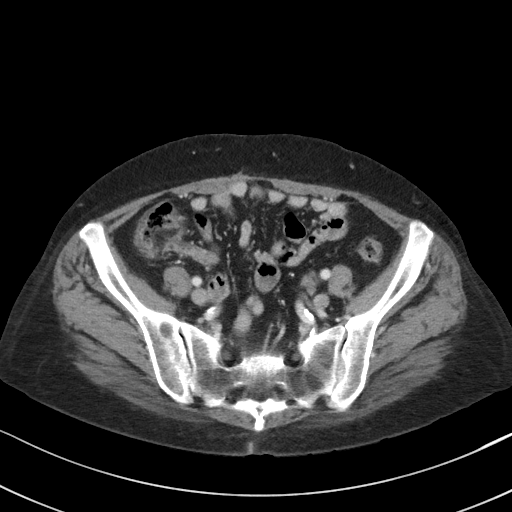
[im 33/93  soft-tissue]
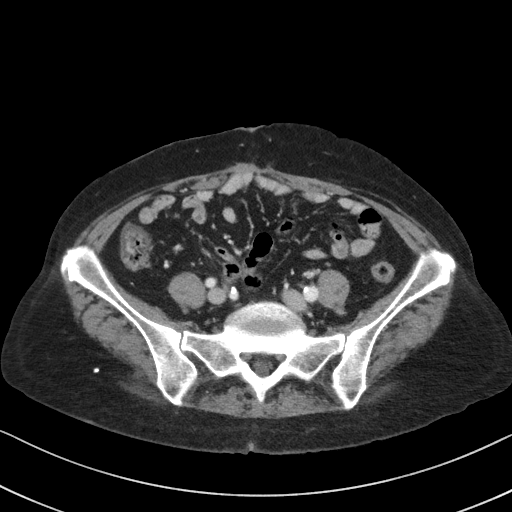
[im 42/93  soft-tissue]
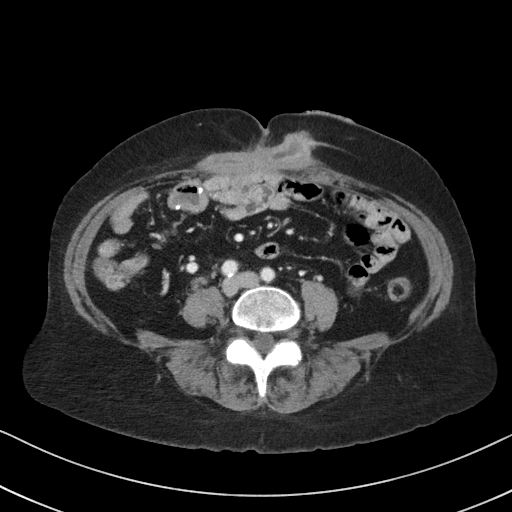
[im 47/93  soft-tissue]
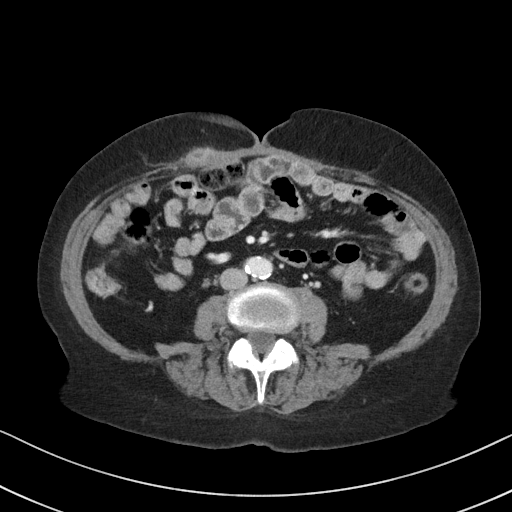
[im 51/93  soft-tissue]
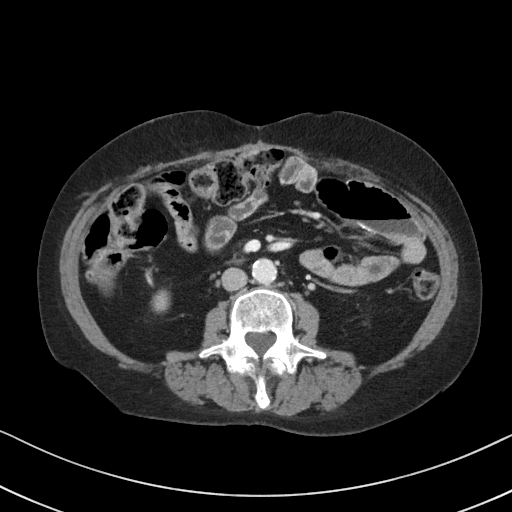
[im 60/93  soft-tissue]
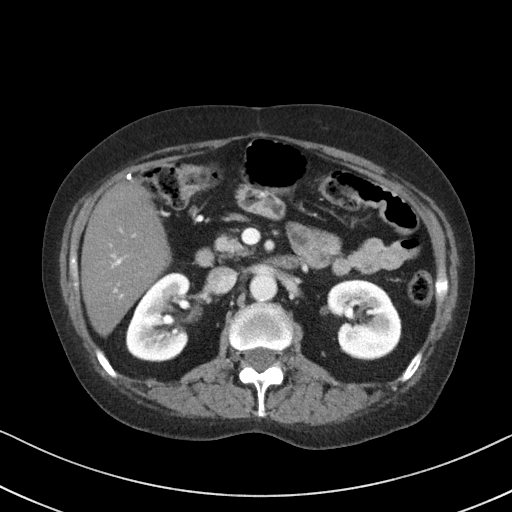
[im 60/93  bone]
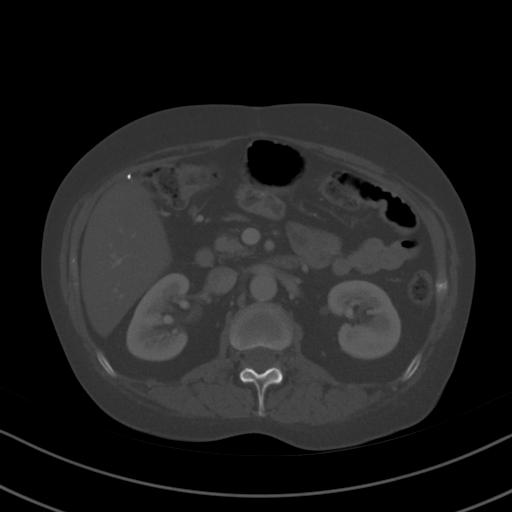
[im 65/93  soft-tissue]
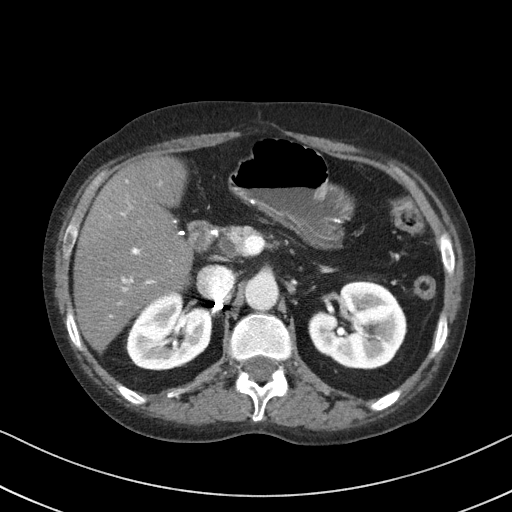
[im 74/93  soft-tissue]
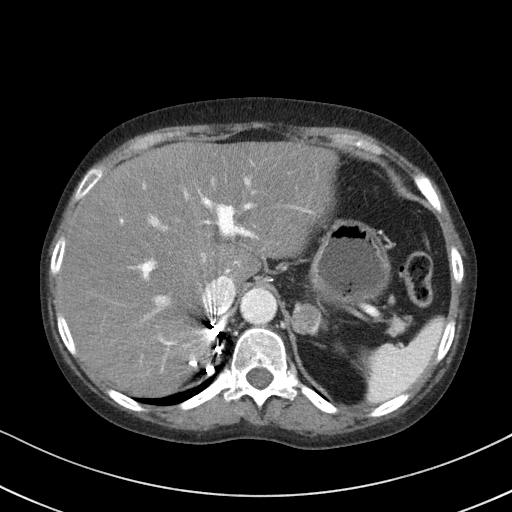
[im 79/93  soft-tissue]
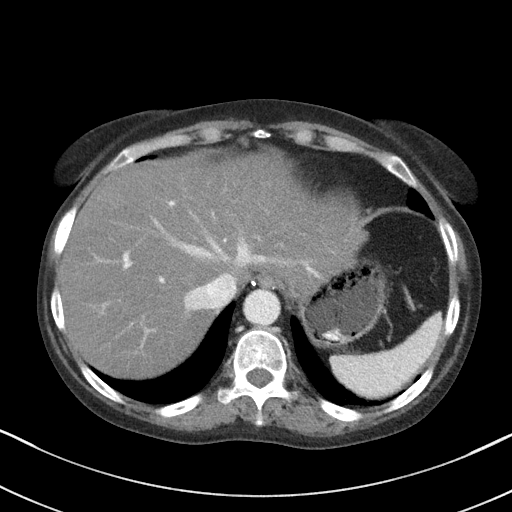
[im 88/93  soft-tissue]
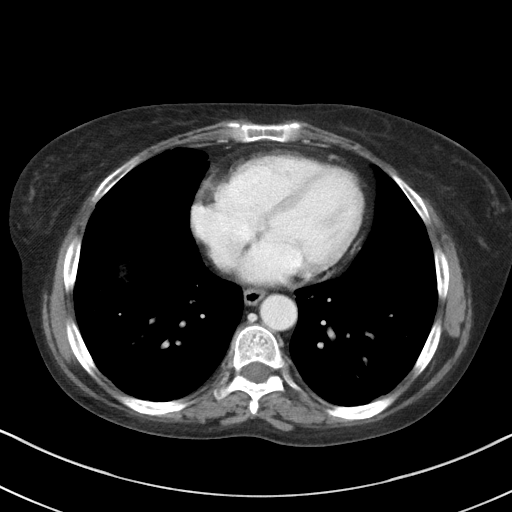

[Series 6: coronal st · coronal · 0.80mm/px · 3 of 83 slices shown]
[im 28/83  soft-tissue]
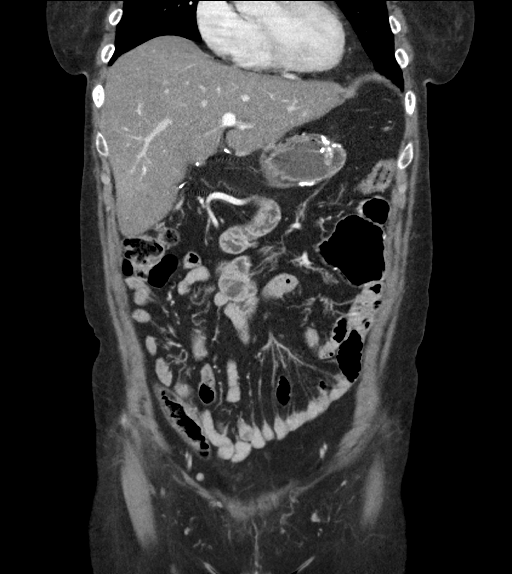
[im 37/83  soft-tissue]
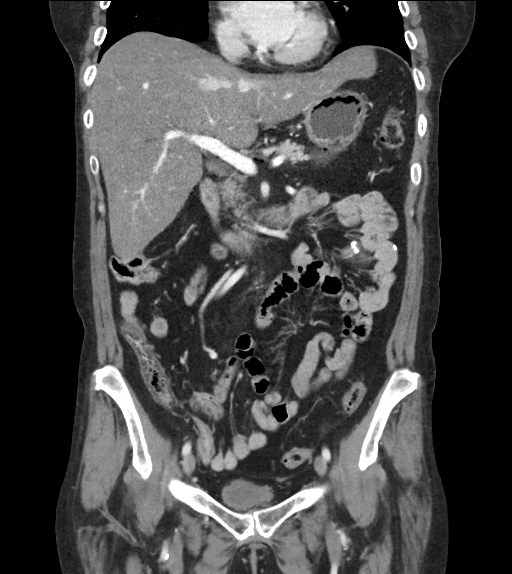
[im 46/83  soft-tissue]
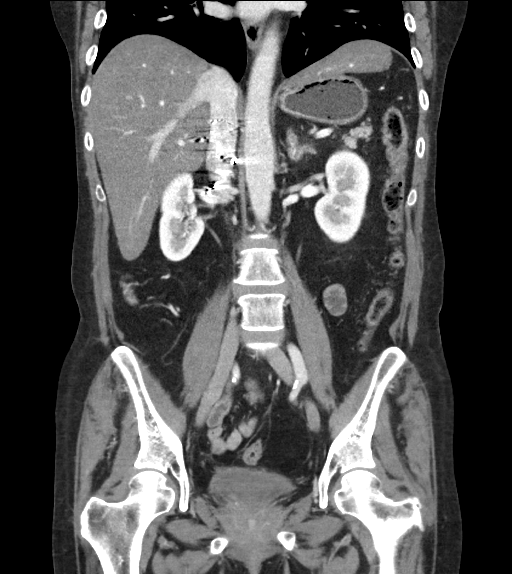

[16 of 46 positions shown; findings below may reference images not displayed]

FINDINGS: Lower chest: The lung bases are clear of acute process. No pleural
effusion or pulmonary lesions. The heart is normal in size. No
pericardial effusion. The distal esophagus and aorta are
unremarkable. Stable surgical changes at the GE junction.

Hepatobiliary: Diffuse fatty infiltration of the liver. No focal
hepatic lesions or intrahepatic biliary dilatation. The gallbladder
surgically absent. Stable moderate common bile duct dilatation
measuring 13.5 mm in the porta hepatis.

Pancreas: Stable pancreatic atrophy. No mass, inflammation or ductal
dilatation.

Spleen: Normal size.  No focal lesions.

Adrenals/Urinary Tract: Stable left adrenal gland nodule consistent
with benign adenoma. The right adrenal gland is surgically absent.

Both kidneys are unremarkable. No worrisome renal lesions or
hydronephrosis. No obstructing ureteral calculi or bladder calculi.
No bladder mass.

Stomach/Bowel: Stable surgical changes involving the stomach from
gastric bypass surgery. No complicating features. No obstructive
findings. The small bowel and colon are grossly normal. Stable
surgical changes. The terminal ileum is normal.

Vascular/Lymphatic: Stable atherosclerotic calcifications involving
the aorta and iliac arteries. No aneurysm or dissection. The branch
vessels are patent. The major venous structures are patent.

Reproductive: The uterus and ovaries are surgically absent.

Other: Remote surgical changes involving the anterior abdominal wall
from a prior colostomy. There appears to be a persistent tract of
fluid to the ostomy site which could be a chronic enterocutaneous
fistula.

Musculoskeletal: No significant bony findings.
IMPRESSION: 1. No acute abdominal/pelvic findings, mass lesions or
lymphadenopathy.
2. Stable extensive surgical changes involving the abdomen/pelvis.
Gastric bypass surgery without complicating features.
3. Diffuse fatty infiltration of the liver.
4. Stable common bile duct dilatation.
5. Stable postoperative changes involving the anterior abdominal
wall with a persistent fluid-filled tract leading to the ostomy
site. Possible chronic enterocutaneous fistula.

## 2018-01-29 IMAGING — DX DG CHEST 2V
2 series · 2 of 2 positions shown · non-contrast
Comparison: 09/17/2016

CLINICAL DATA: Worsening mid abdominal pain, nausea, vomiting

EXAM:
CHEST  2 VIEW

[chest pa]
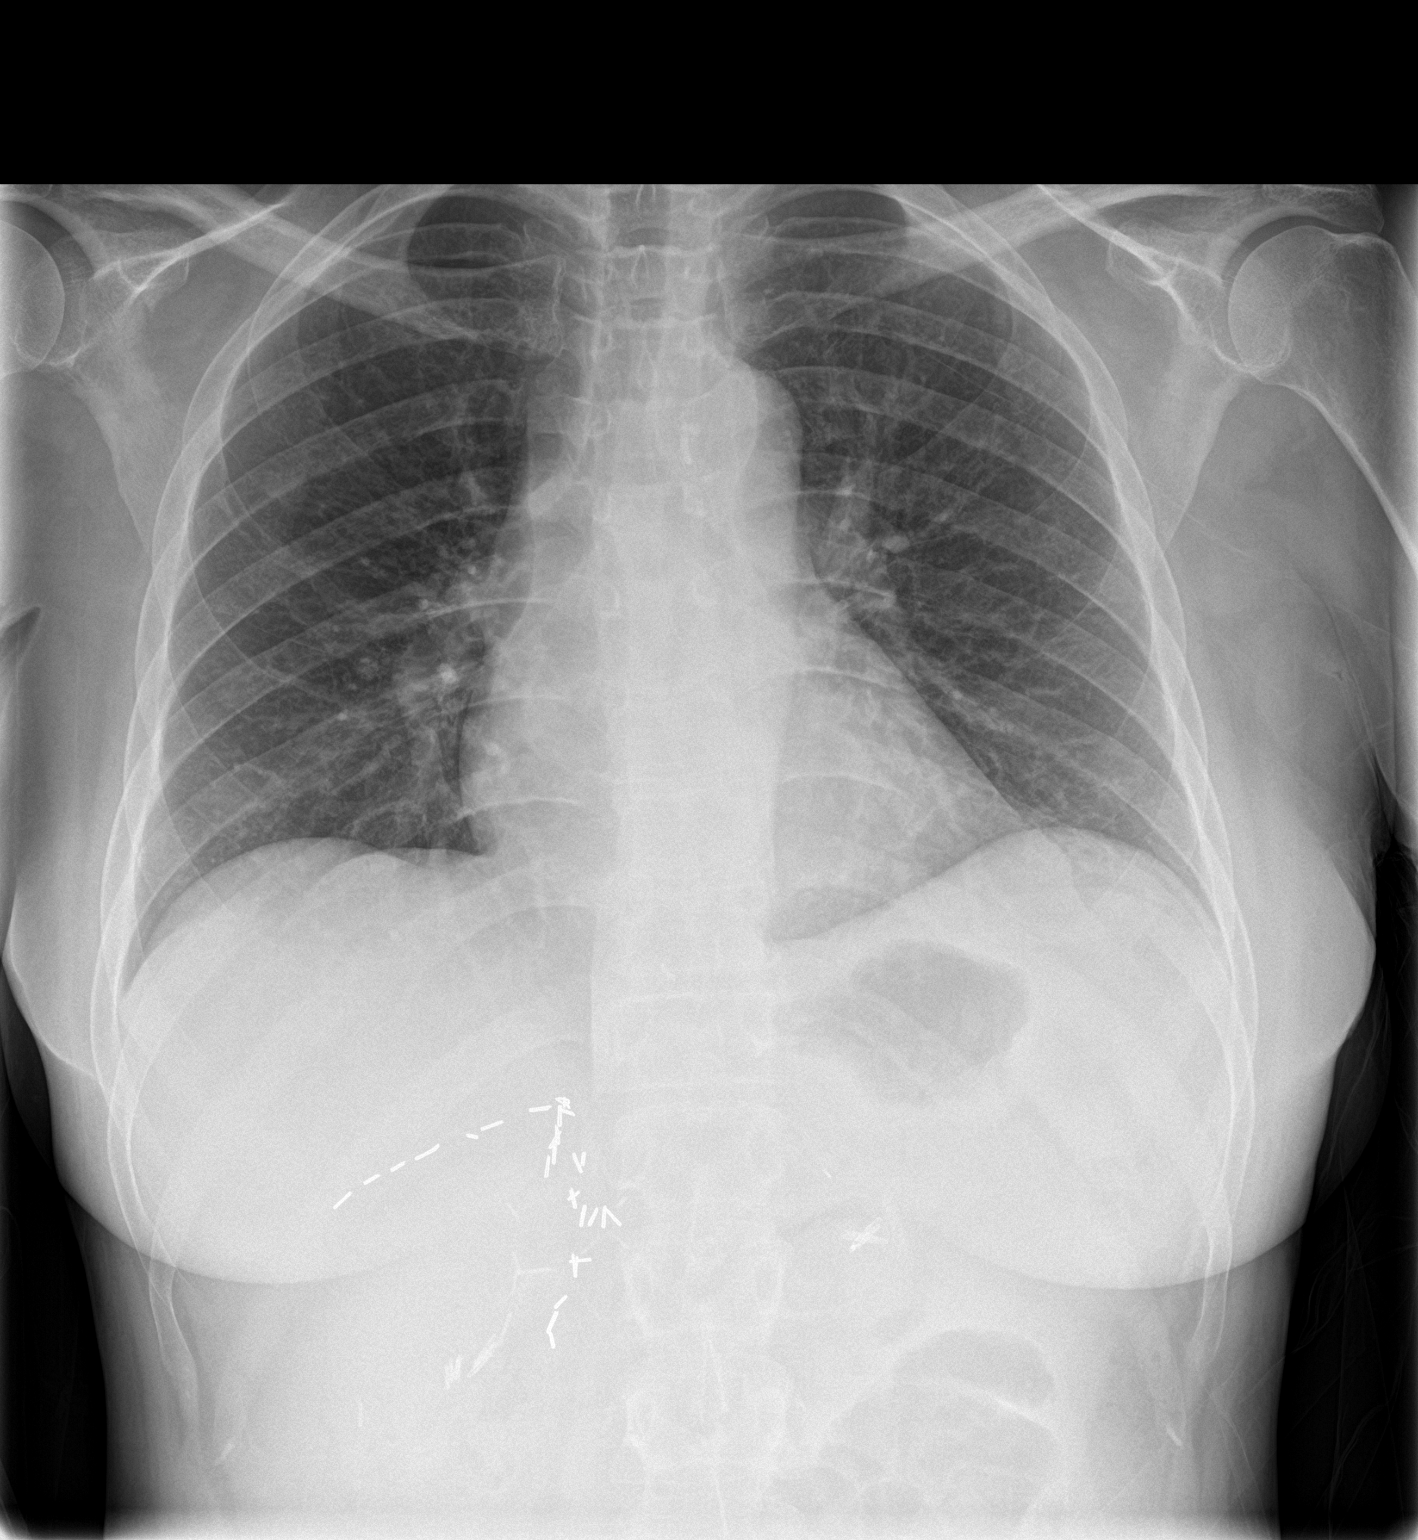

[chest lat]
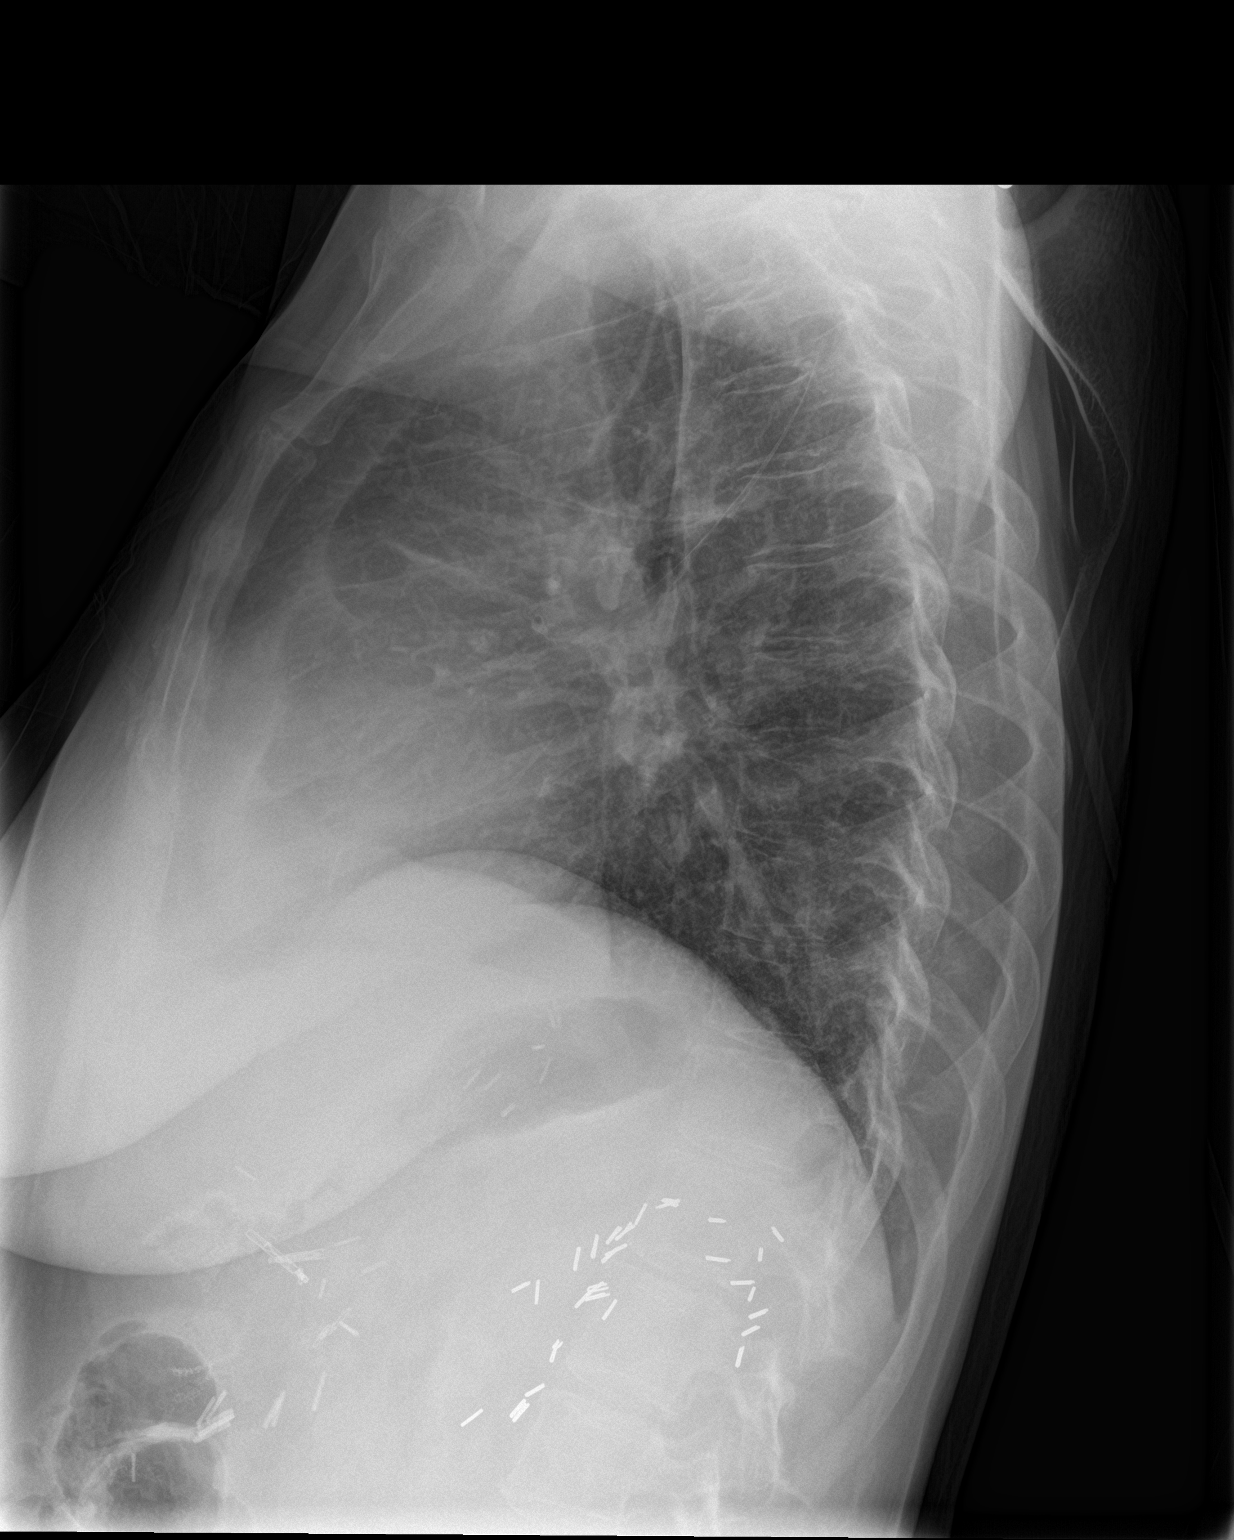

[2 of 2 positions shown; findings below may reference images not displayed]

FINDINGS: Heart and mediastinal contours are within normal limits. No focal
opacities or effusions. No acute bony abnormality.
IMPRESSION: No active cardiopulmonary disease.

## 2018-02-26 IMAGING — DX DG FEMUR 2+V*L*
4 series · 4 of 4 positions shown · non-contrast
Comparison: None.

CLINICAL DATA: Left femur pain after unwitnessed fall.

EXAM:
LEFT FEMUR 2 VIEWS

[femur ap (1 of 2)]
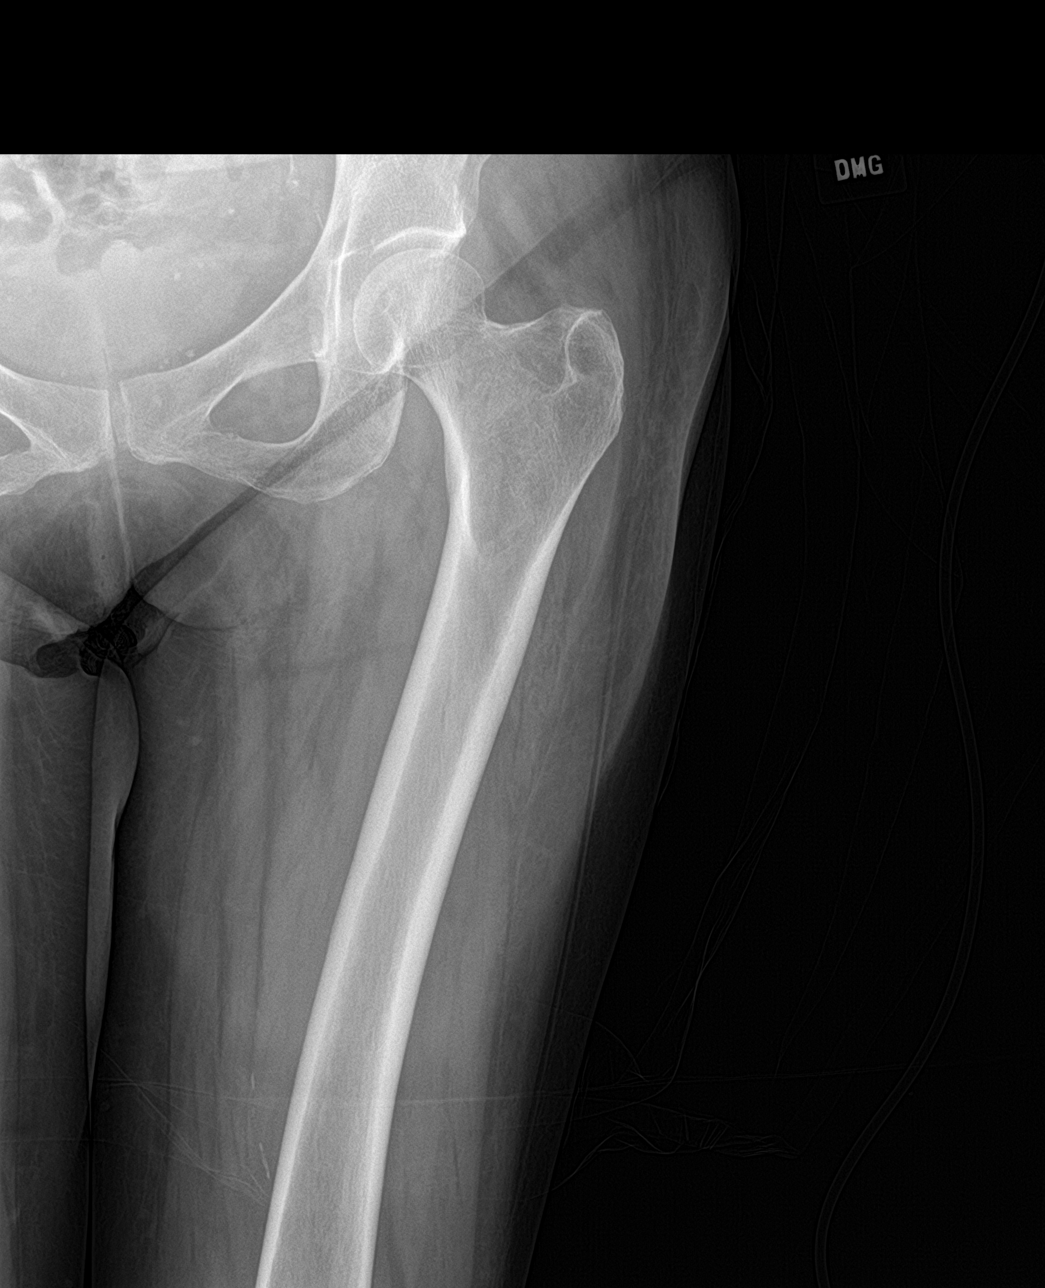

[femur ap (2 of 2)]
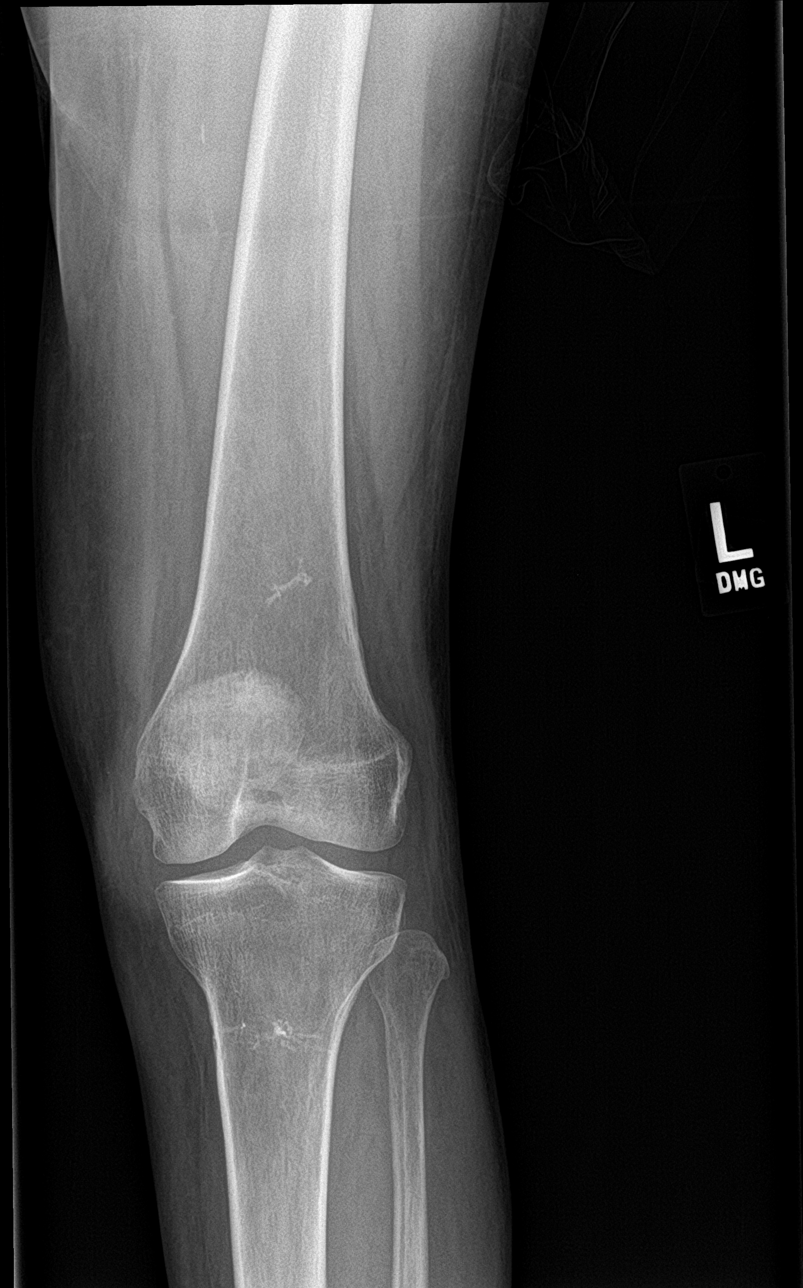

[femur lat (1 of 2)]
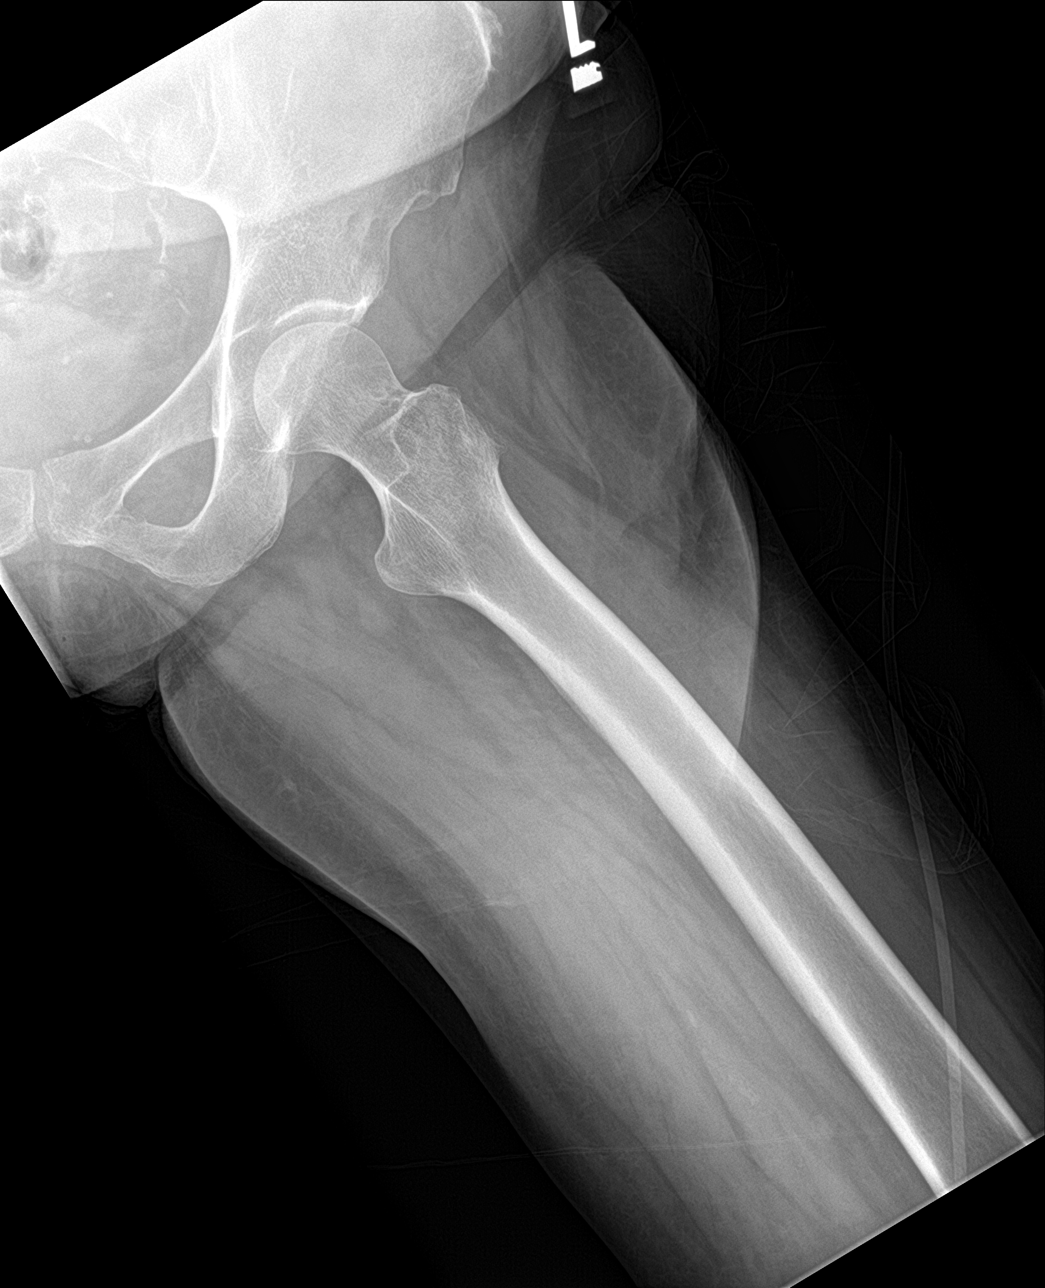

[femur lat (2 of 2)]
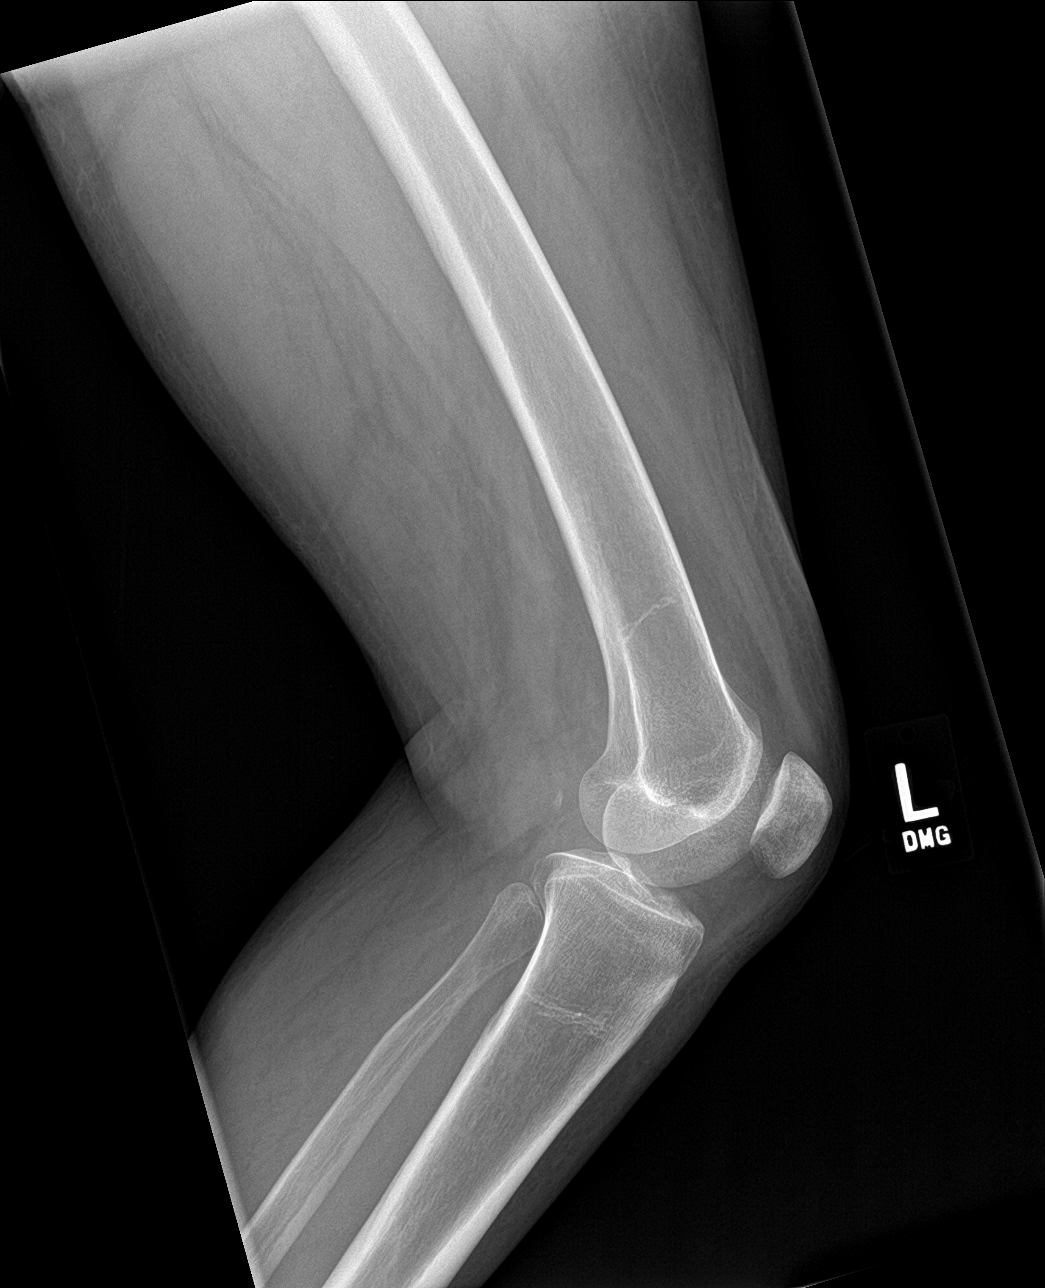

[4 of 4 positions shown; findings below may reference images not displayed]

FINDINGS: There is no evidence of fracture or other focal bone lesions. Soft
tissues are unremarkable.
IMPRESSION: Normal left femur.

## 2018-02-26 IMAGING — DX DG PELVIS 1-2V
1 series · 1 of 1 positions shown · non-contrast
Comparison: CT abdomen pelvis dated September 23, 2016.

CLINICAL DATA: Left hip and left femur pain.

EXAM:
PELVIS - 1-2 VIEW

[pelvis ap]
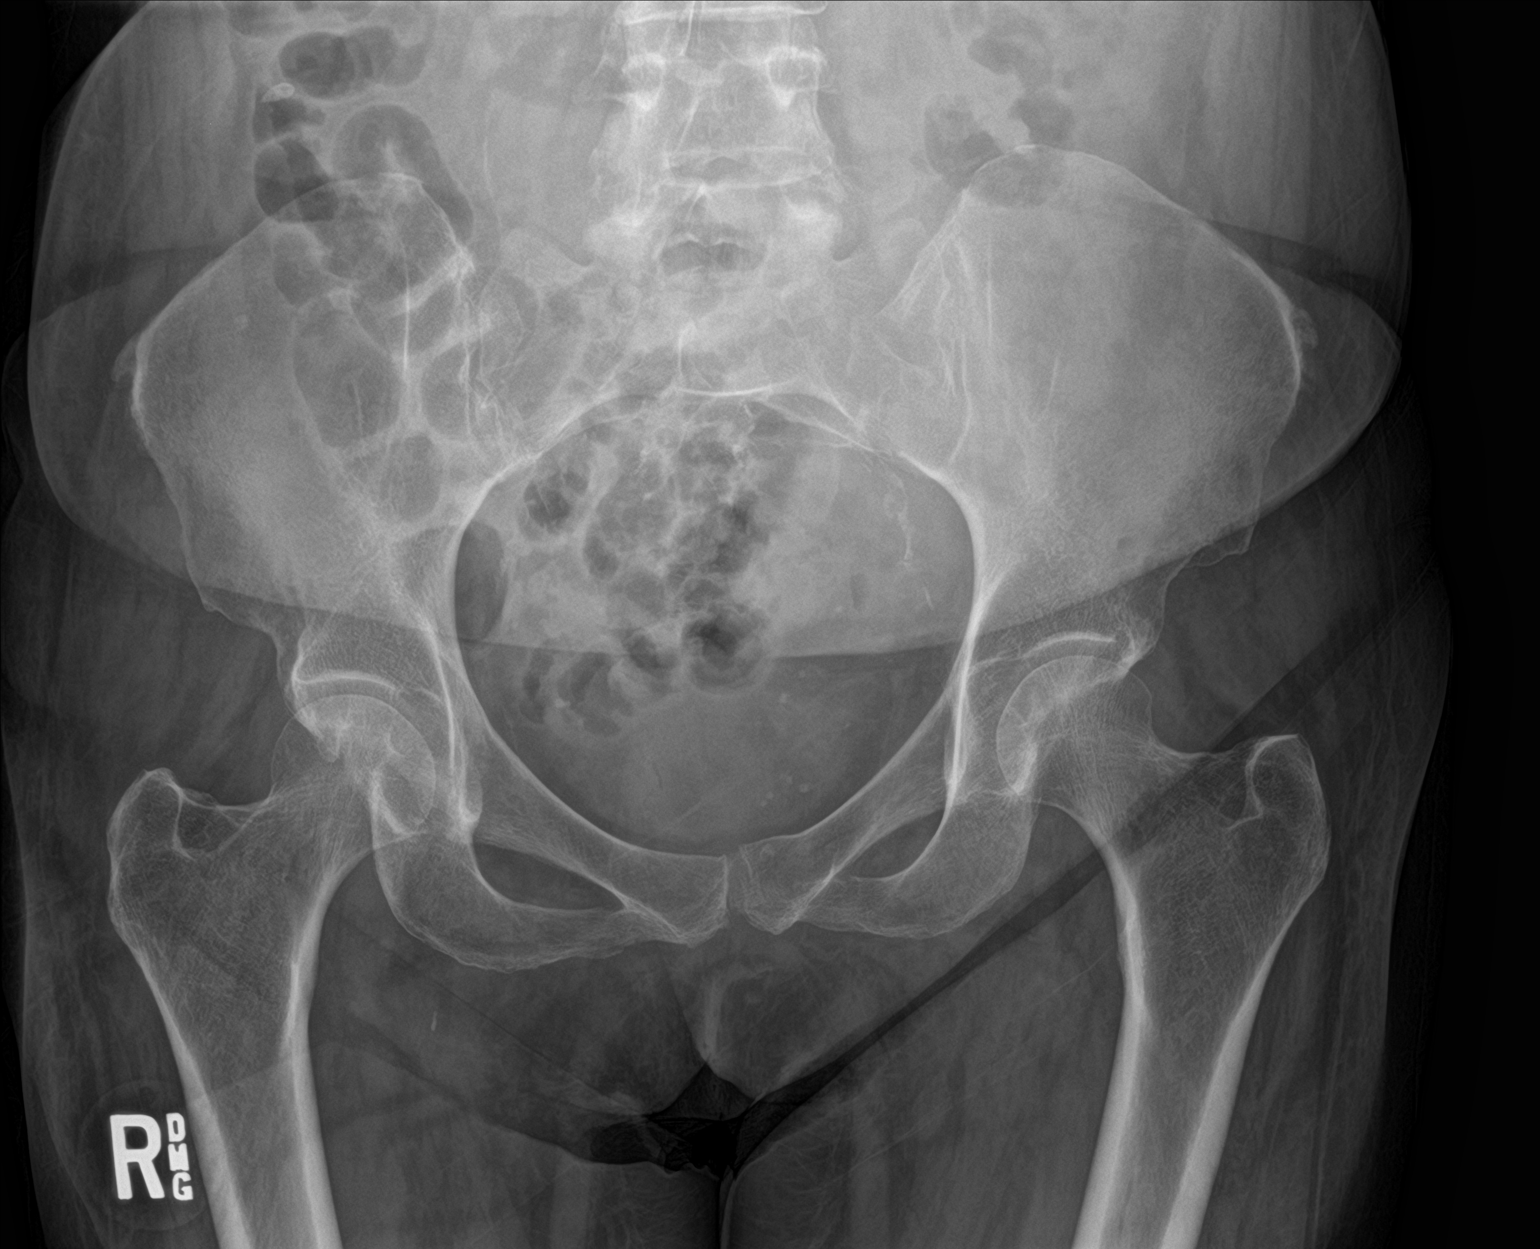

[1 of 1 positions shown; findings below may reference images not displayed]

FINDINGS: There is no evidence of pelvic fracture or diastasis. No pelvic bone
lesions are seen.
IMPRESSION: Negative.

## 2018-02-26 IMAGING — DX DG ABDOMEN ACUTE W/ 1V CHEST
3 series · 3 of 3 positions shown · non-contrast
Comparison: CT scan and radiographs of September 23, 2016.

CLINICAL DATA: Acute generalized abdominal pain.

EXAM:
DG ABDOMEN ACUTE W/ 1V CHEST

[chest pa]
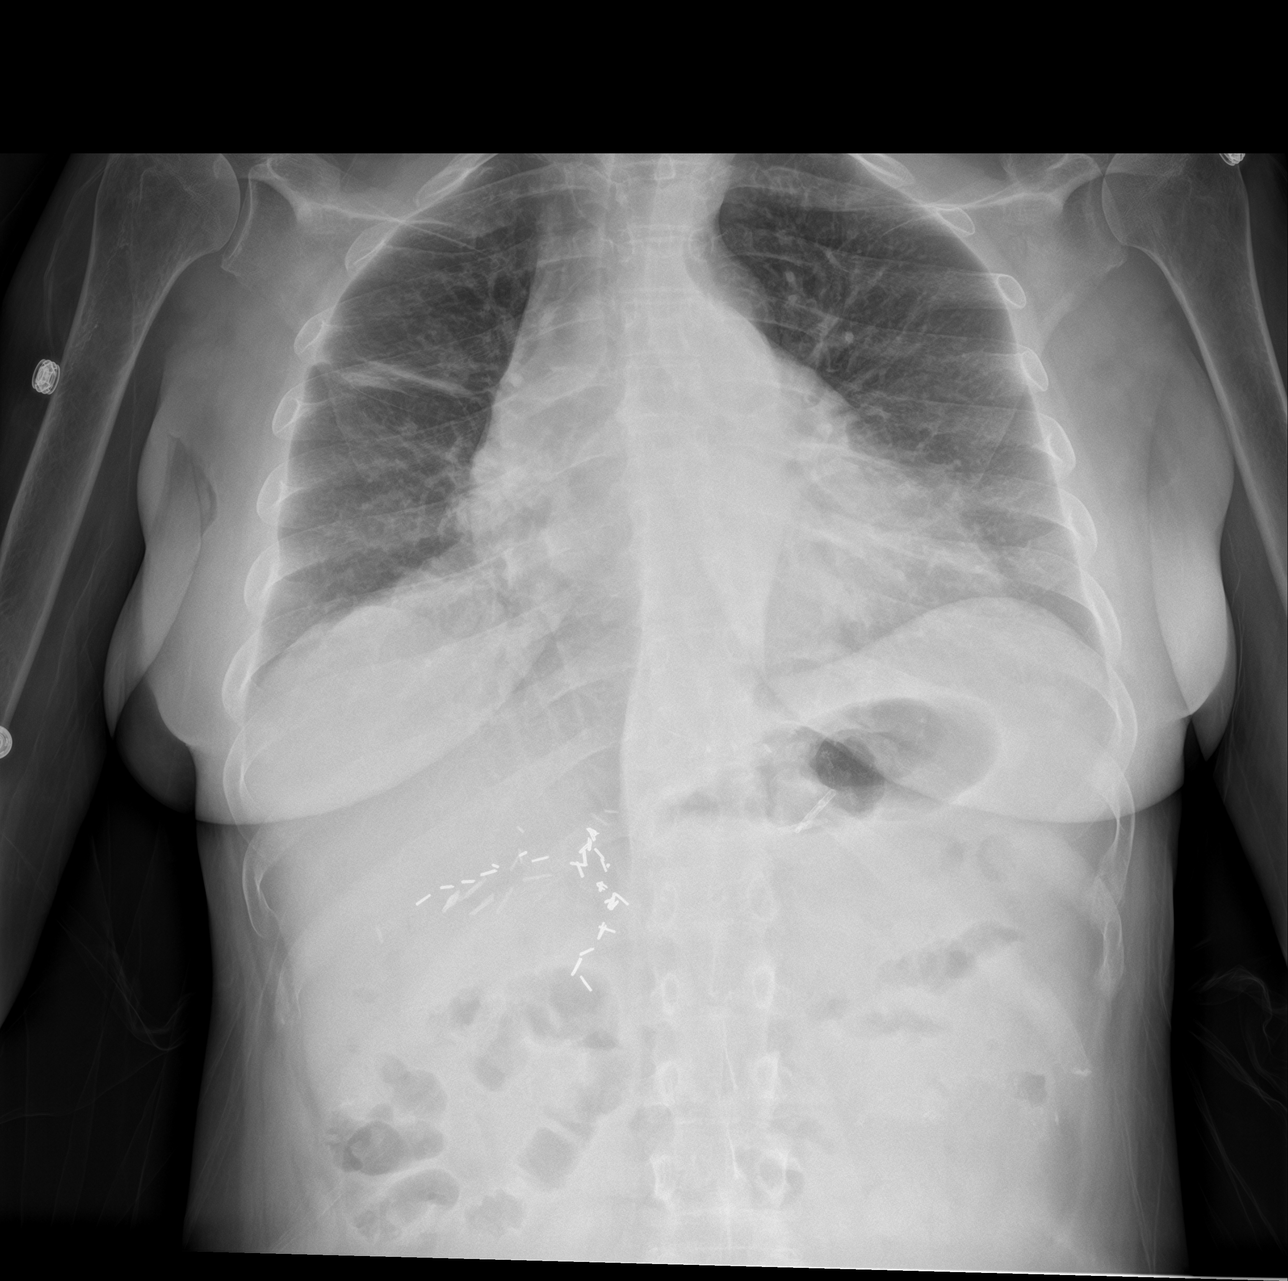

[abdomen erect]
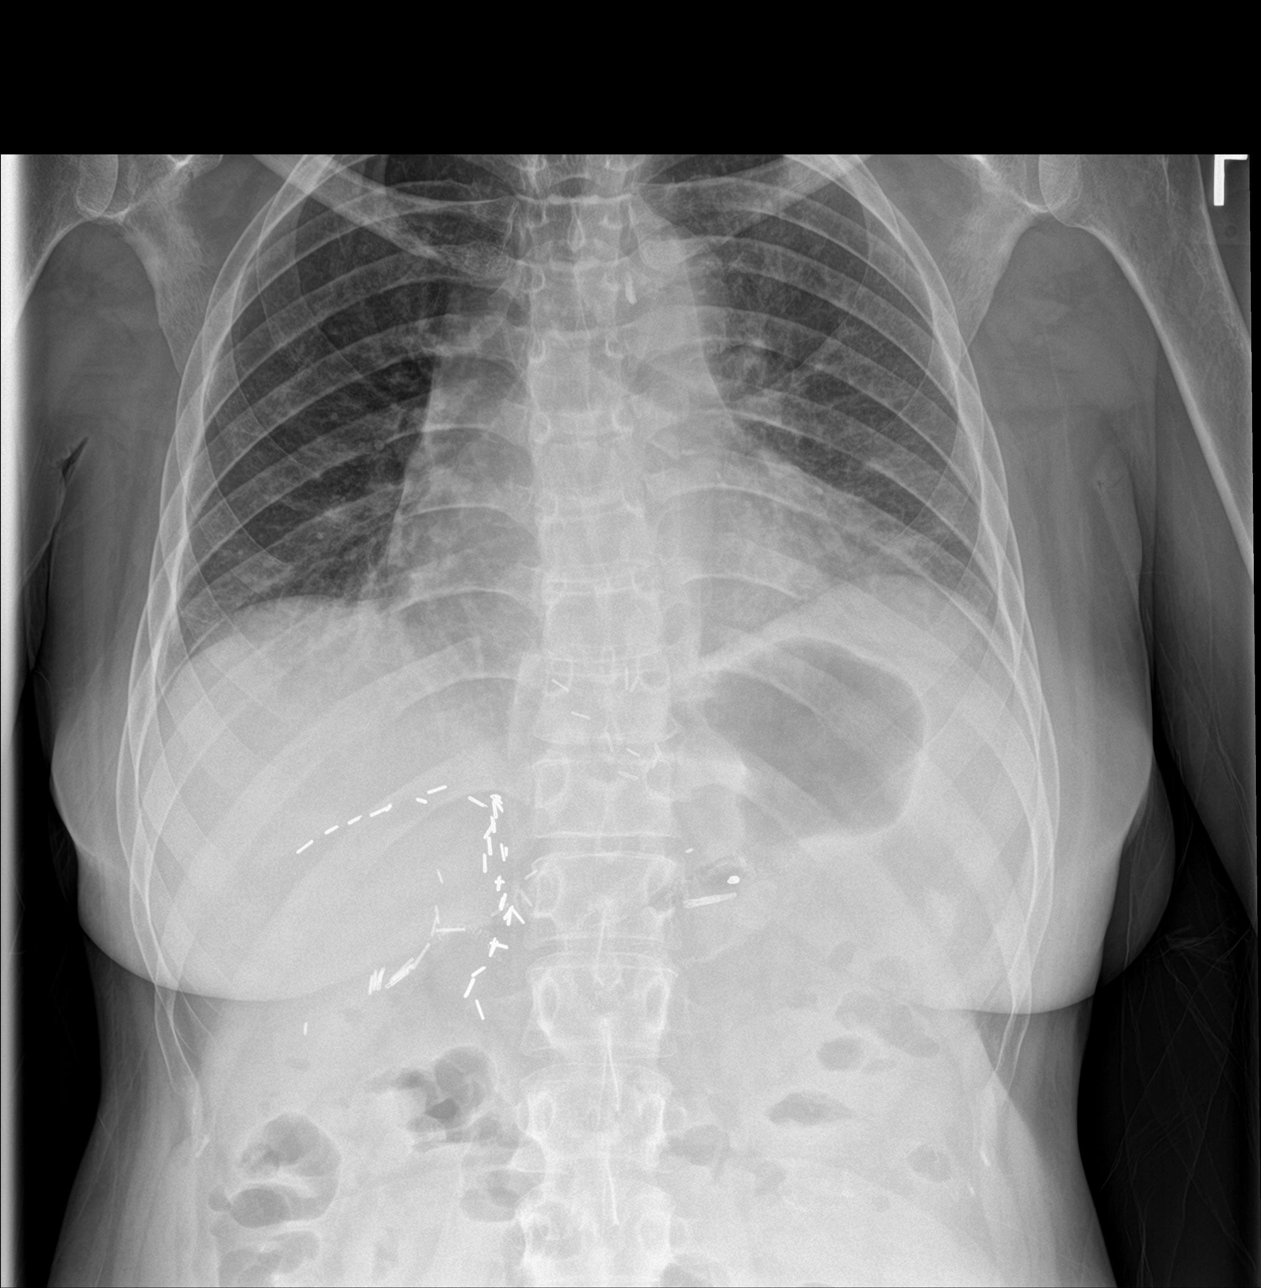

[abdomen supine]
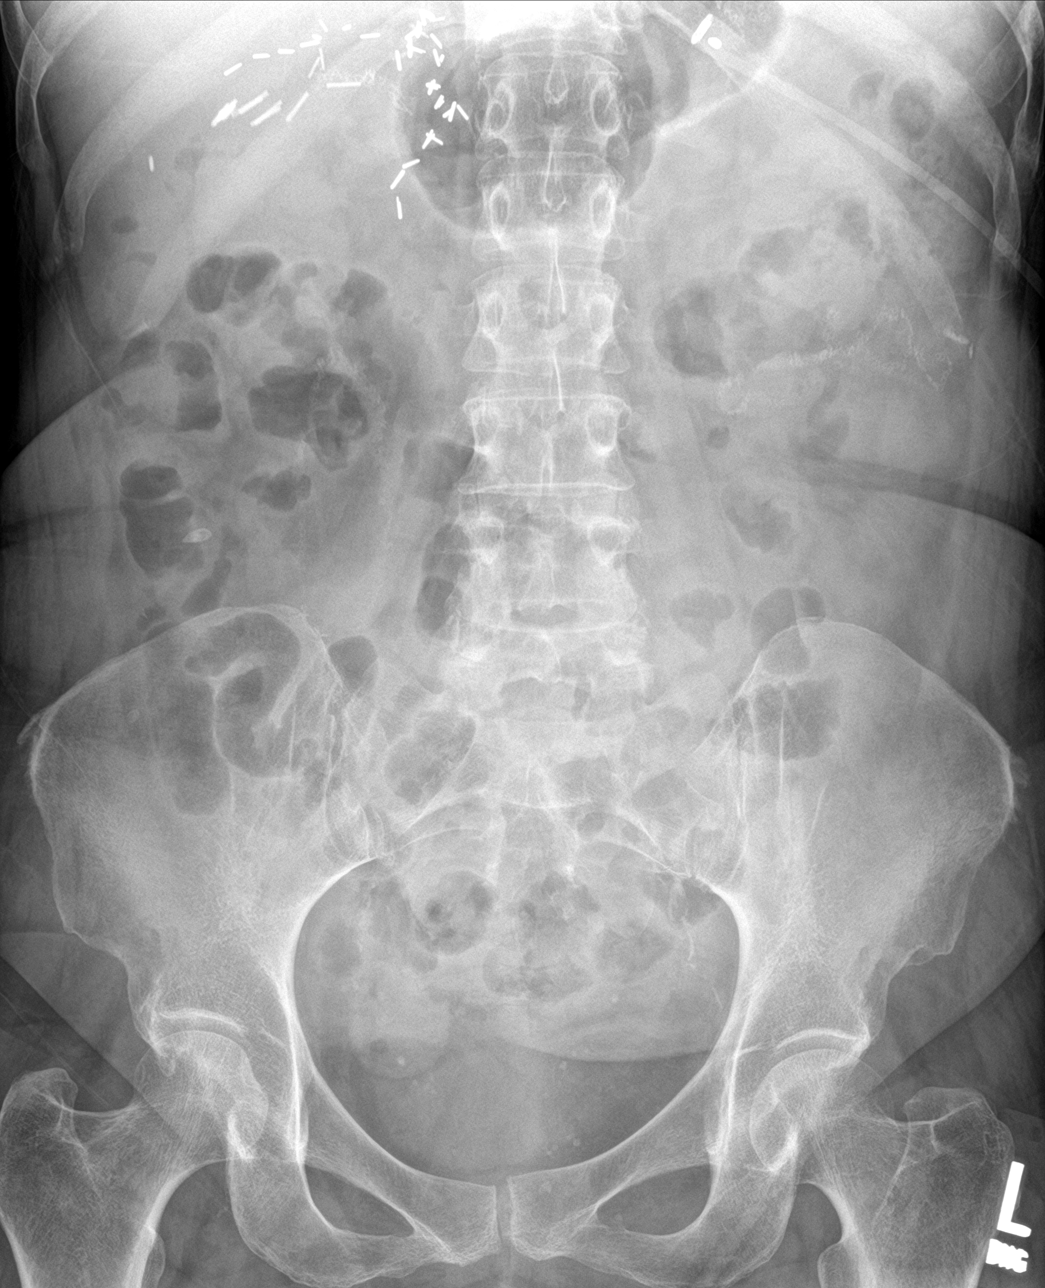

[3 of 3 positions shown; findings below may reference images not displayed]

FINDINGS: There is no evidence of dilated bowel loops or free intraperitoneal
air. Phleboliths are noted in the pelvis. Surgical clips noted the
right upper quad. Stable cardiomediastinal silhouette. Mild right
midlung subsegmental atelectasis is noted. Left lung is clear.
IMPRESSION: No evidence of bowel obstruction or ileus. Right midlung
subsegmental atelectasis is noted.

## 2018-02-26 IMAGING — DX DG FOOT COMPLETE 3+V*L*
3 series · 3 of 3 positions shown · non-contrast
Comparison: None

CLINICAL DATA: Abdominal pain for 3 days with nausea and vomiting,
stop drinking alcohol 1 month ago, has seen bright red blood in her
stools, BILATERAL swelling of the feet, history lupus, hypertension,
GERD, prior Billroth II surgery

EXAM:
LEFT FOOT - COMPLETE 3+ VIEW

[foot ap]
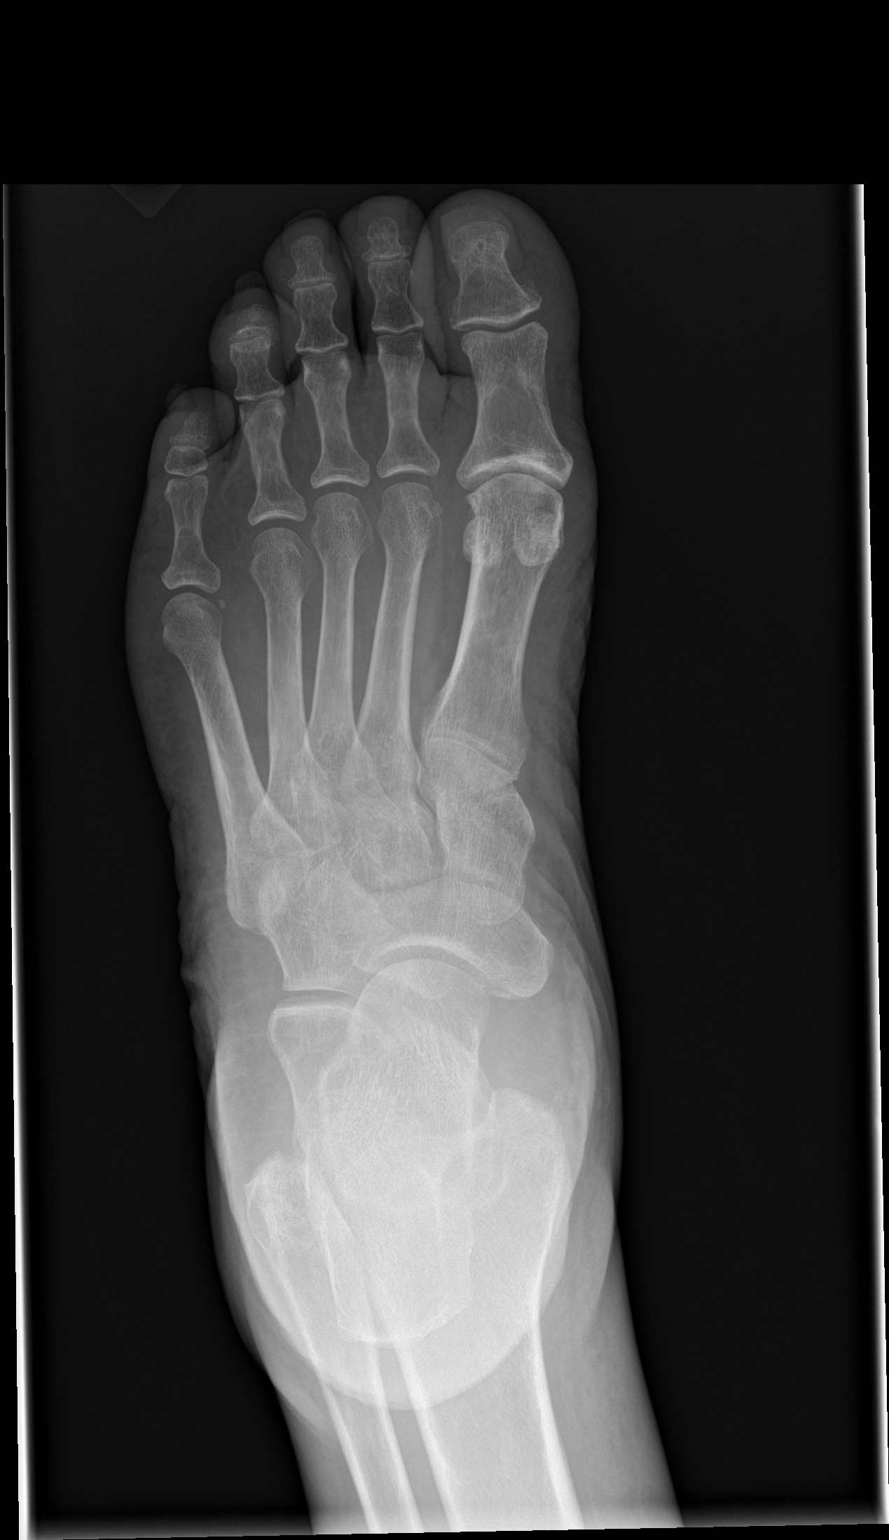

[foot obl]
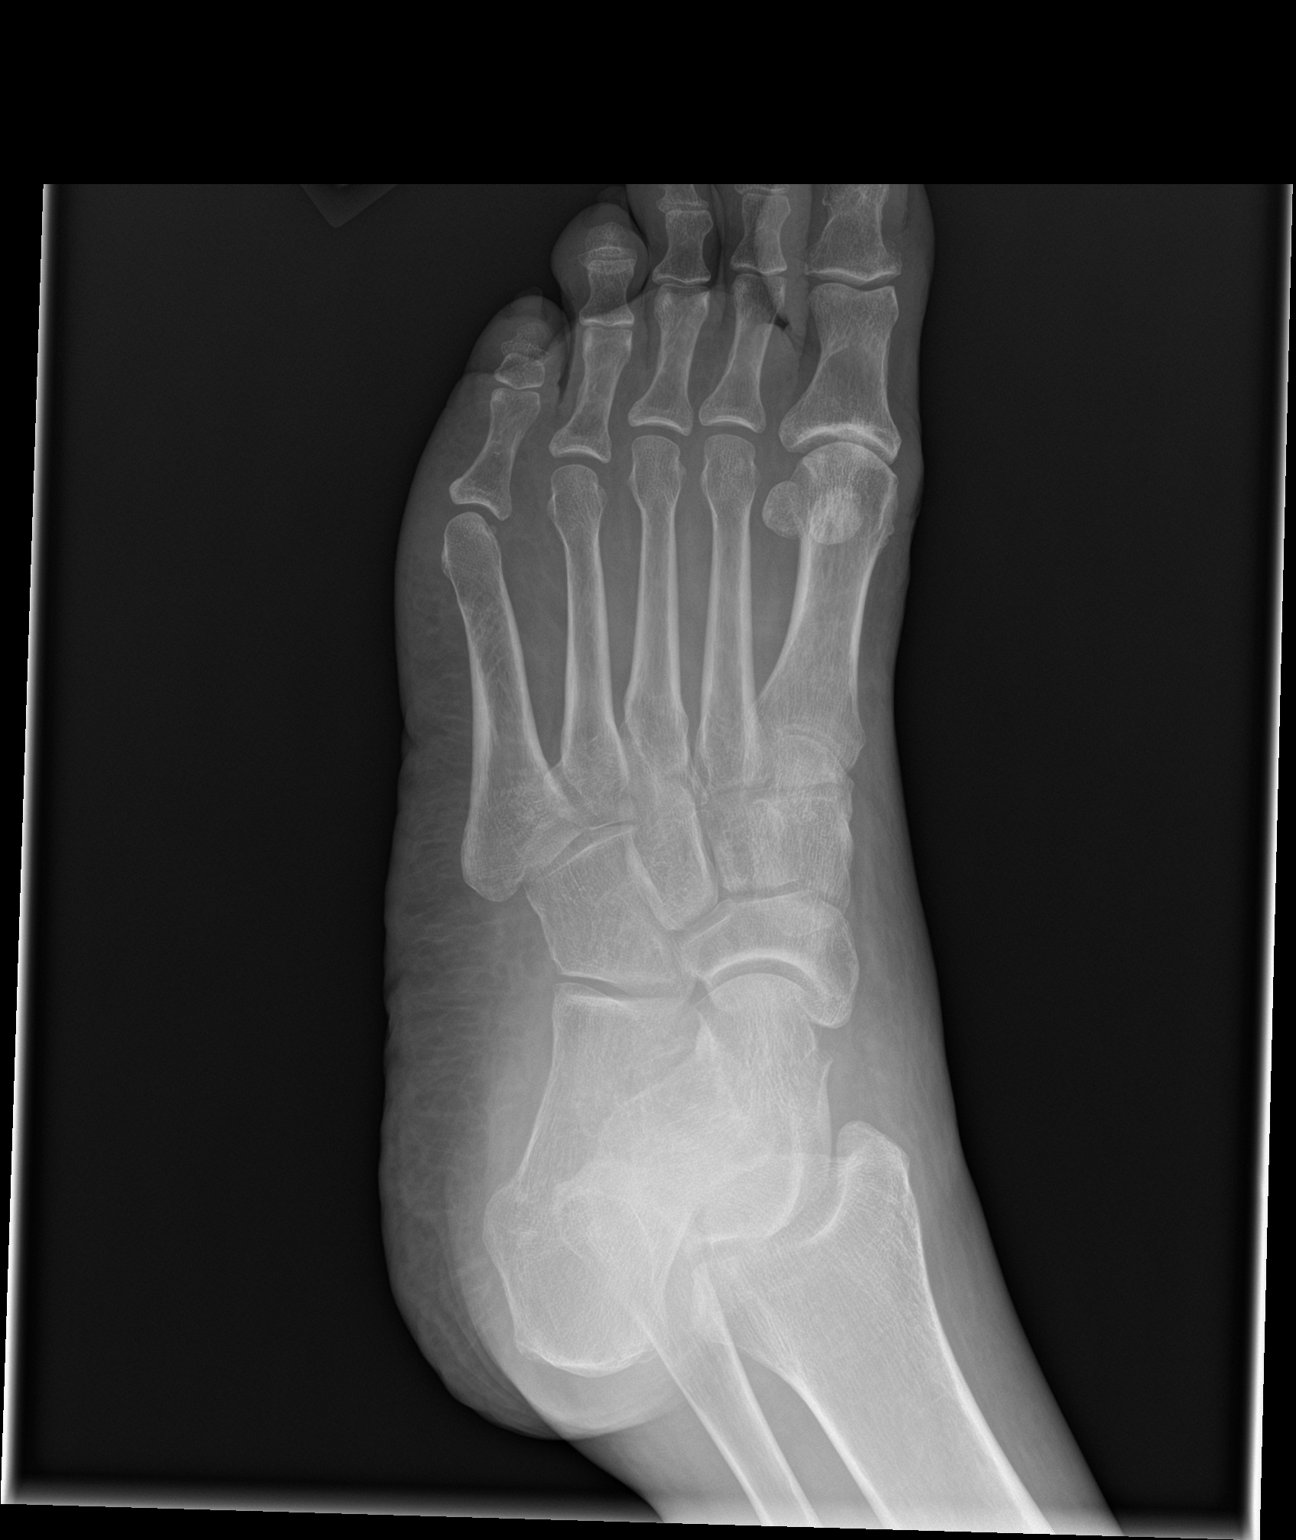

[foot lat]
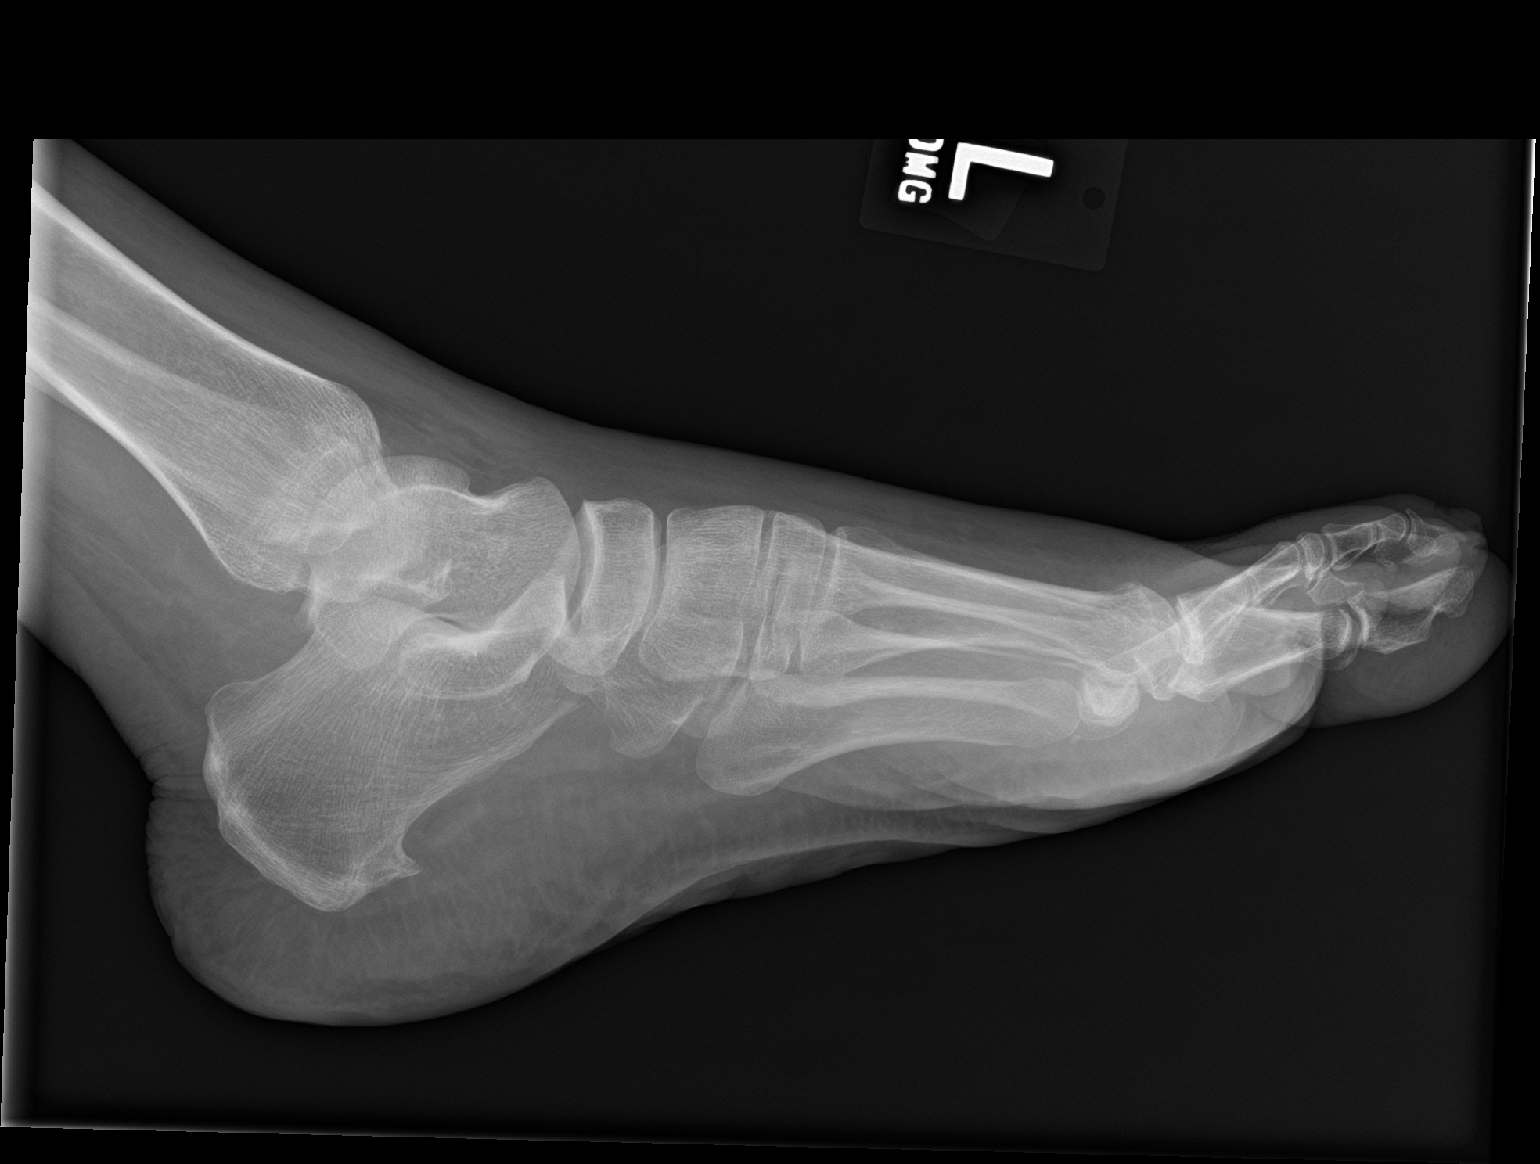

[3 of 3 positions shown; findings below may reference images not displayed]

FINDINGS: Osseous demineralization.

Joint spaces preserved.

No acute fracture, dislocation, or bone destruction.

Small plantar calcaneal spur.

Mild scattered soft tissue swelling.
IMPRESSION: Small plantar calcaneal spur.

No acute osseous abnormalities.

## 2018-02-26 IMAGING — DX DG FOOT COMPLETE 3+V*R*
3 series · 3 of 3 positions shown · non-contrast
Comparison: None.

CLINICAL DATA: Pain.

EXAM:
RIGHT FOOT COMPLETE - 3+ VIEW

[foot ap]
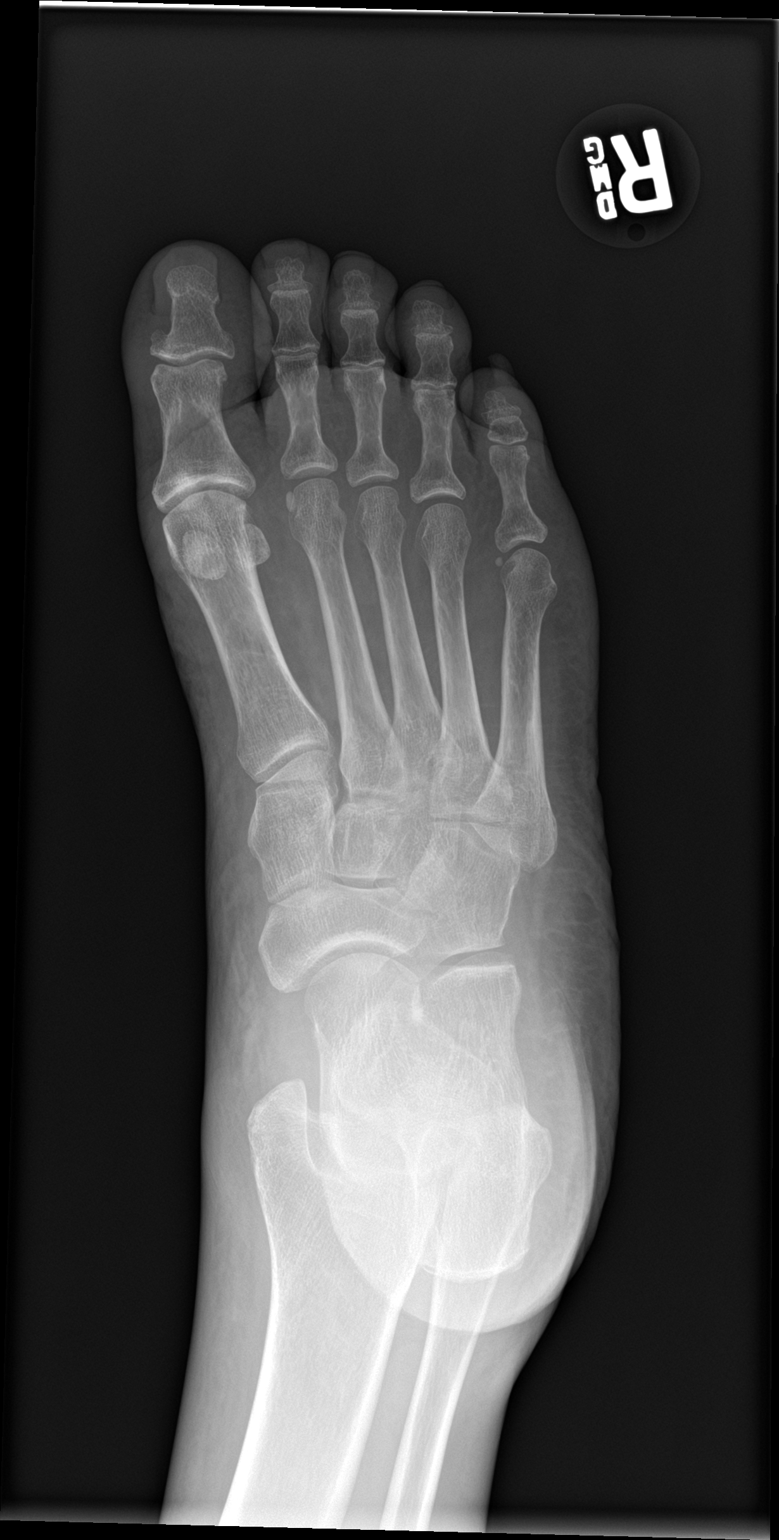

[foot obl]
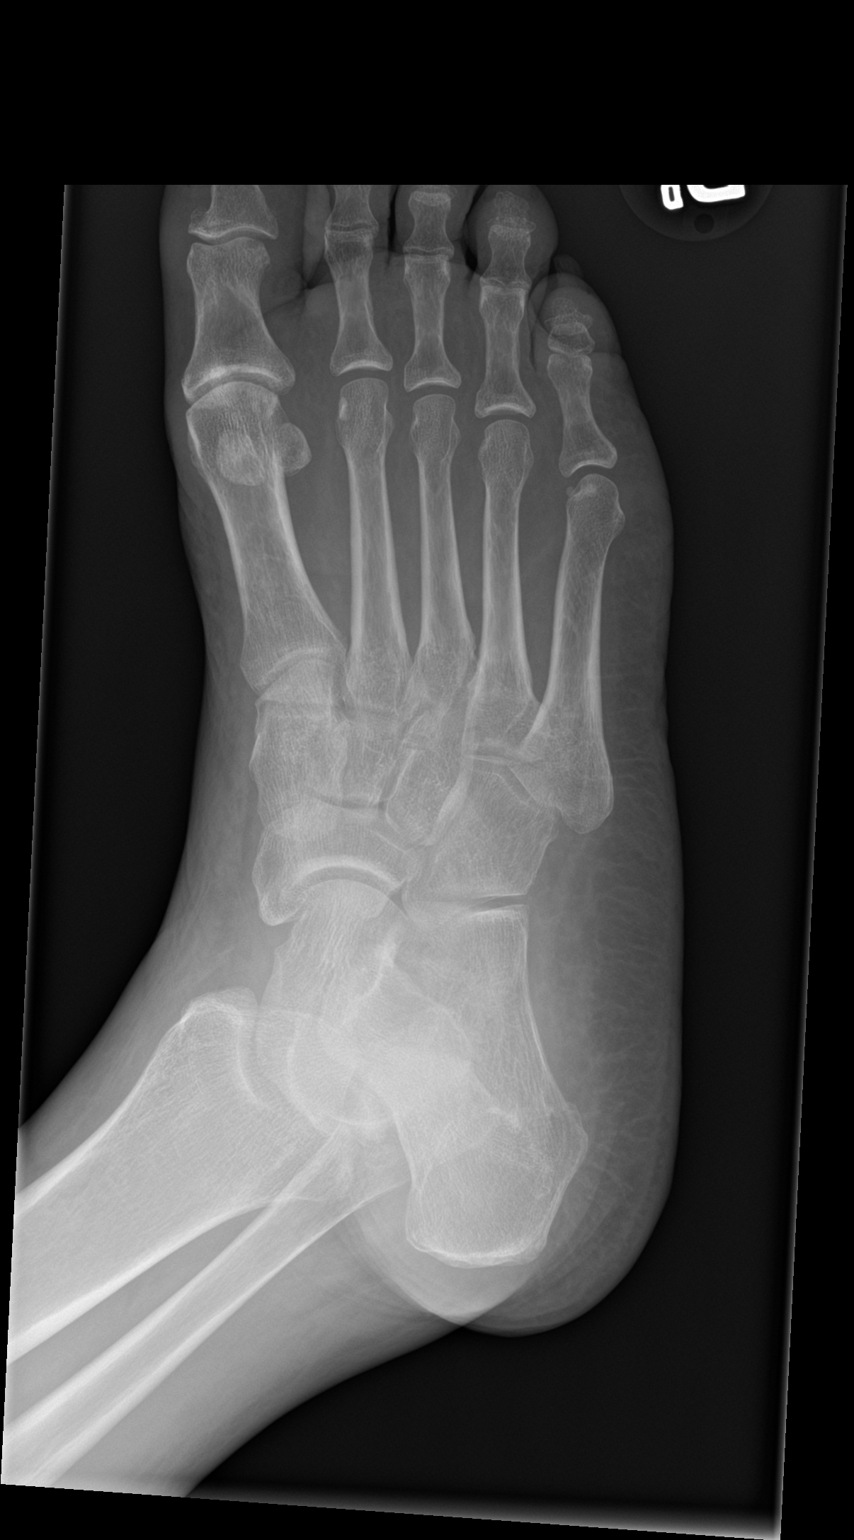

[foot lat]
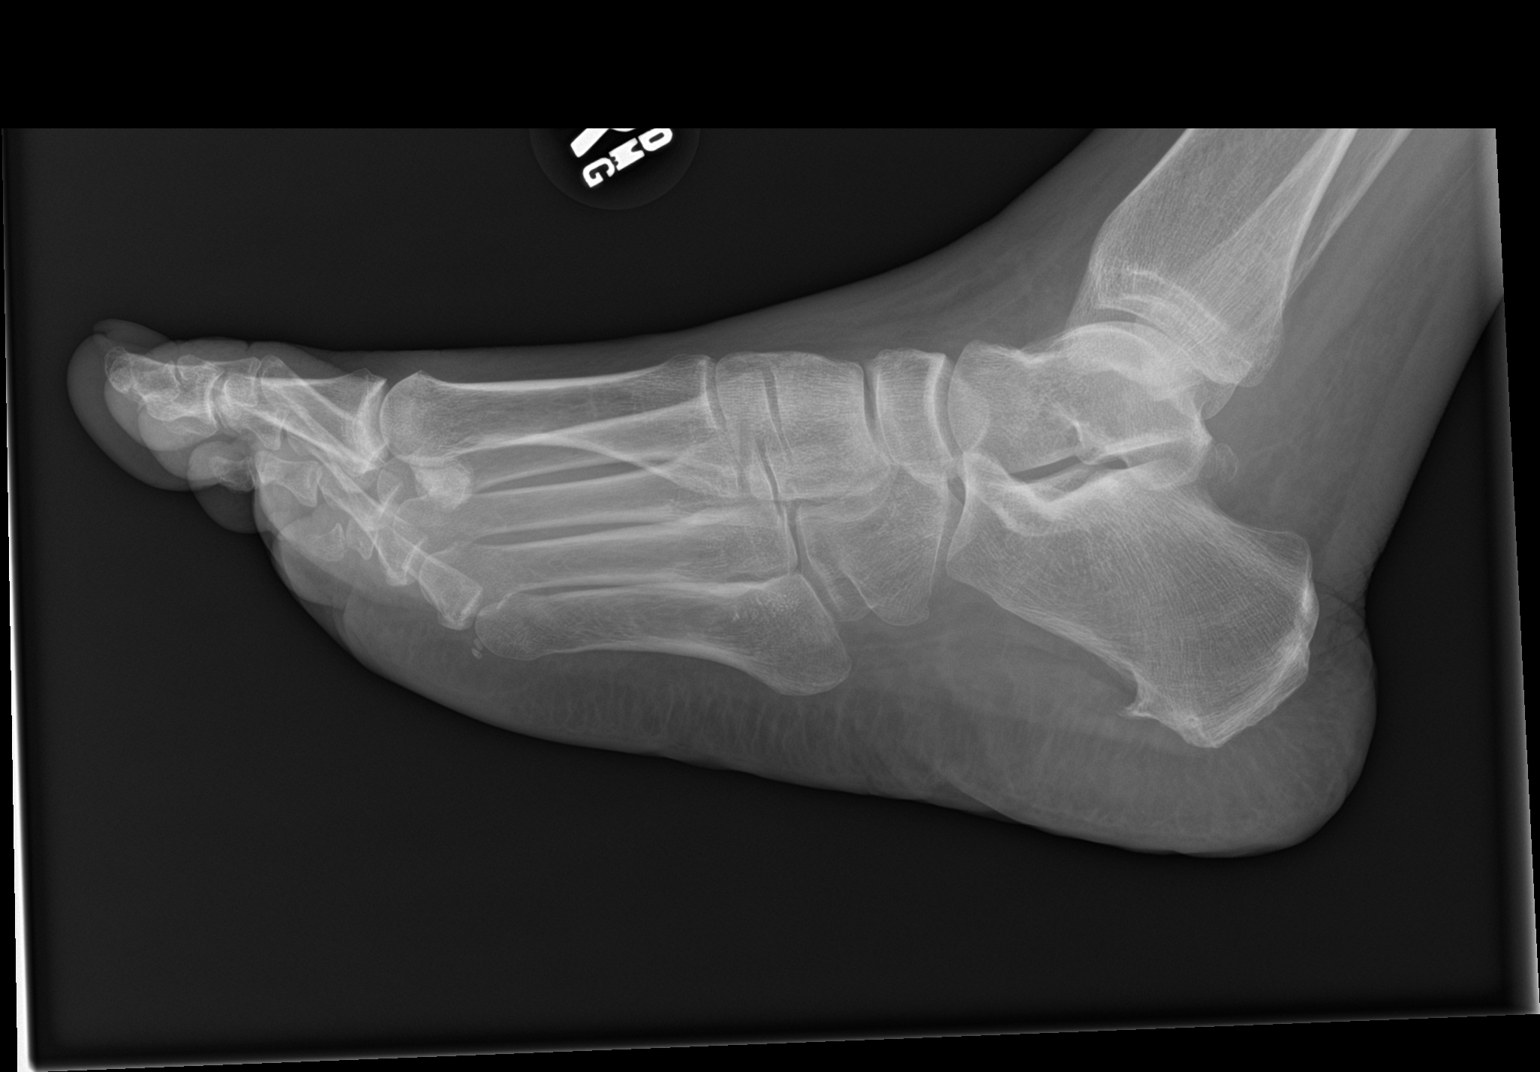

[3 of 3 positions shown; findings below may reference images not displayed]

FINDINGS: There is no evidence of fracture or dislocation. There is no
evidence of arthropathy or other focal bone abnormality. Osteopenia.
Os trigonum. Small plantar enthesophyte. Soft tissues are
unremarkable.
IMPRESSION: Negative.

## 2018-03-09 IMAGING — DX DG HIP (WITH OR WITHOUT PELVIS) 2-3V*L*
3 series · 3 of 3 positions shown · non-contrast
Comparison: None.

CLINICAL DATA: Left hip pain for 1 week

EXAM:
DG HIP (WITH OR WITHOUT PELVIS) 2-3V LEFT

[pelvis ap]
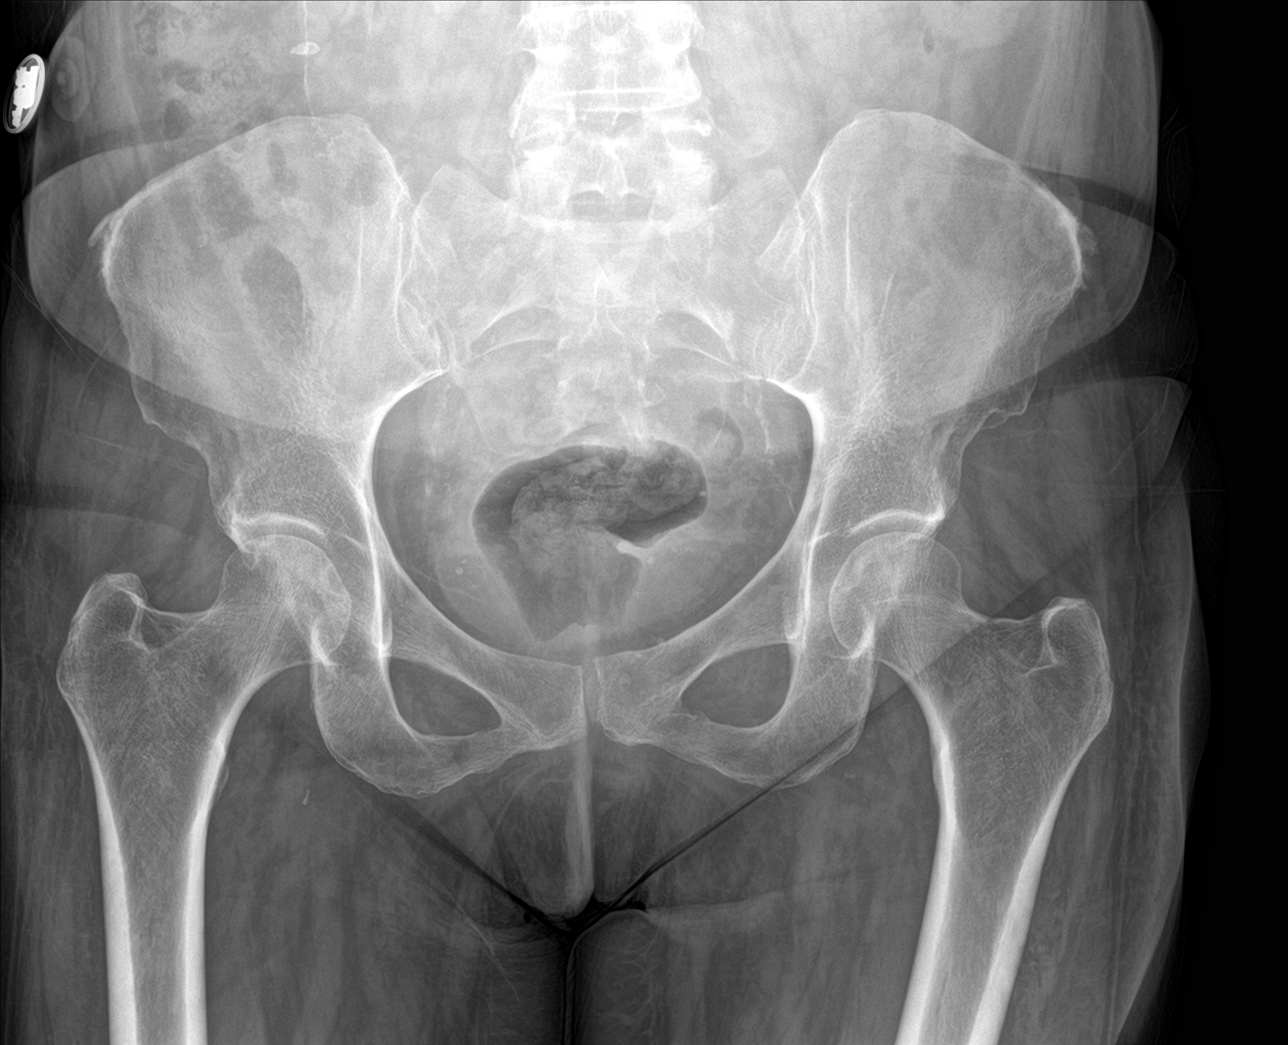

[hip ap]
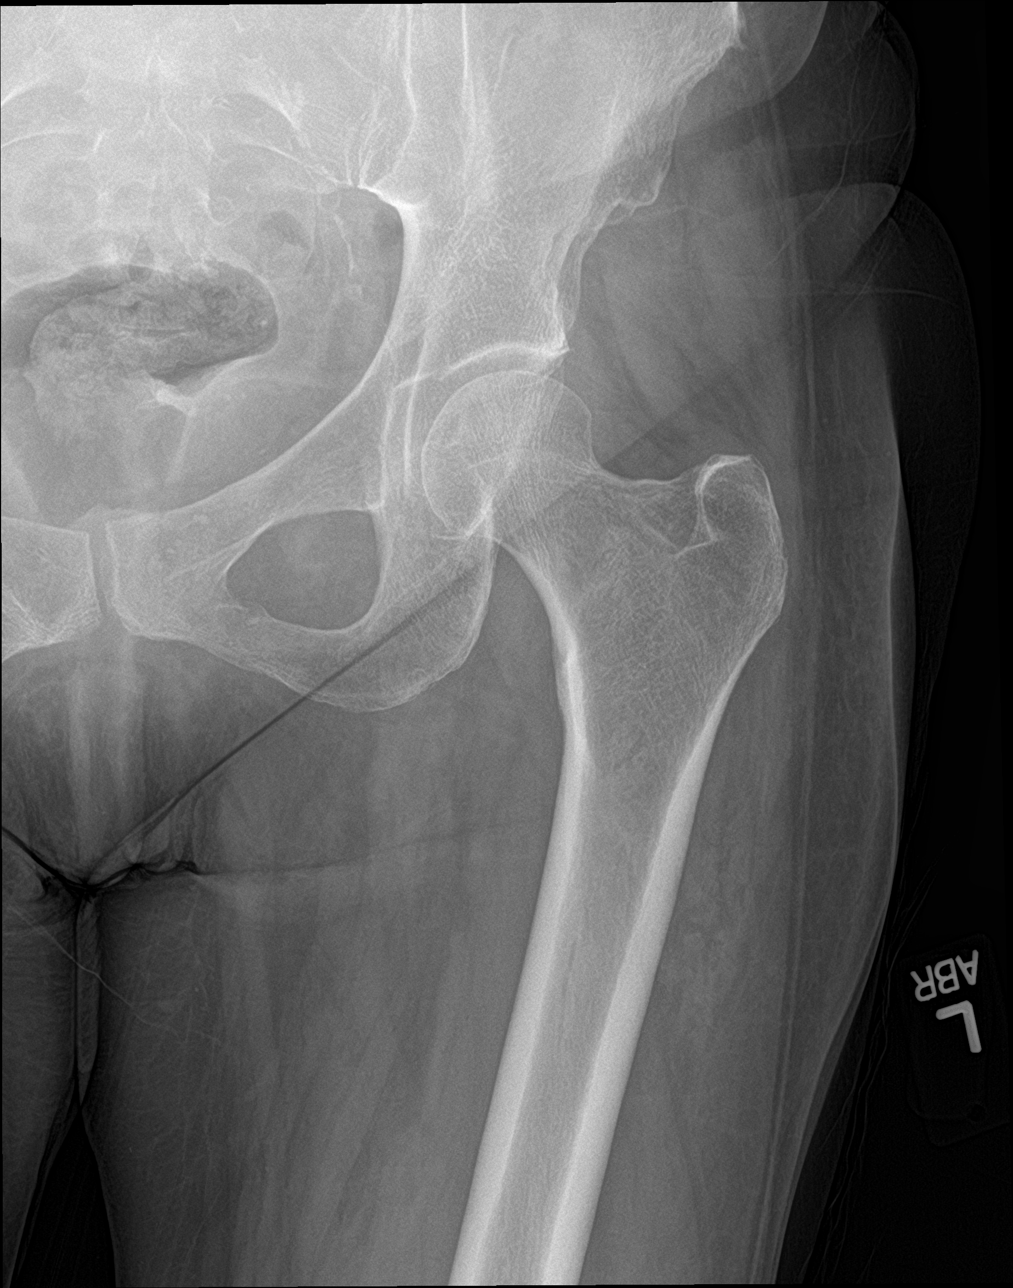

[hip lat]
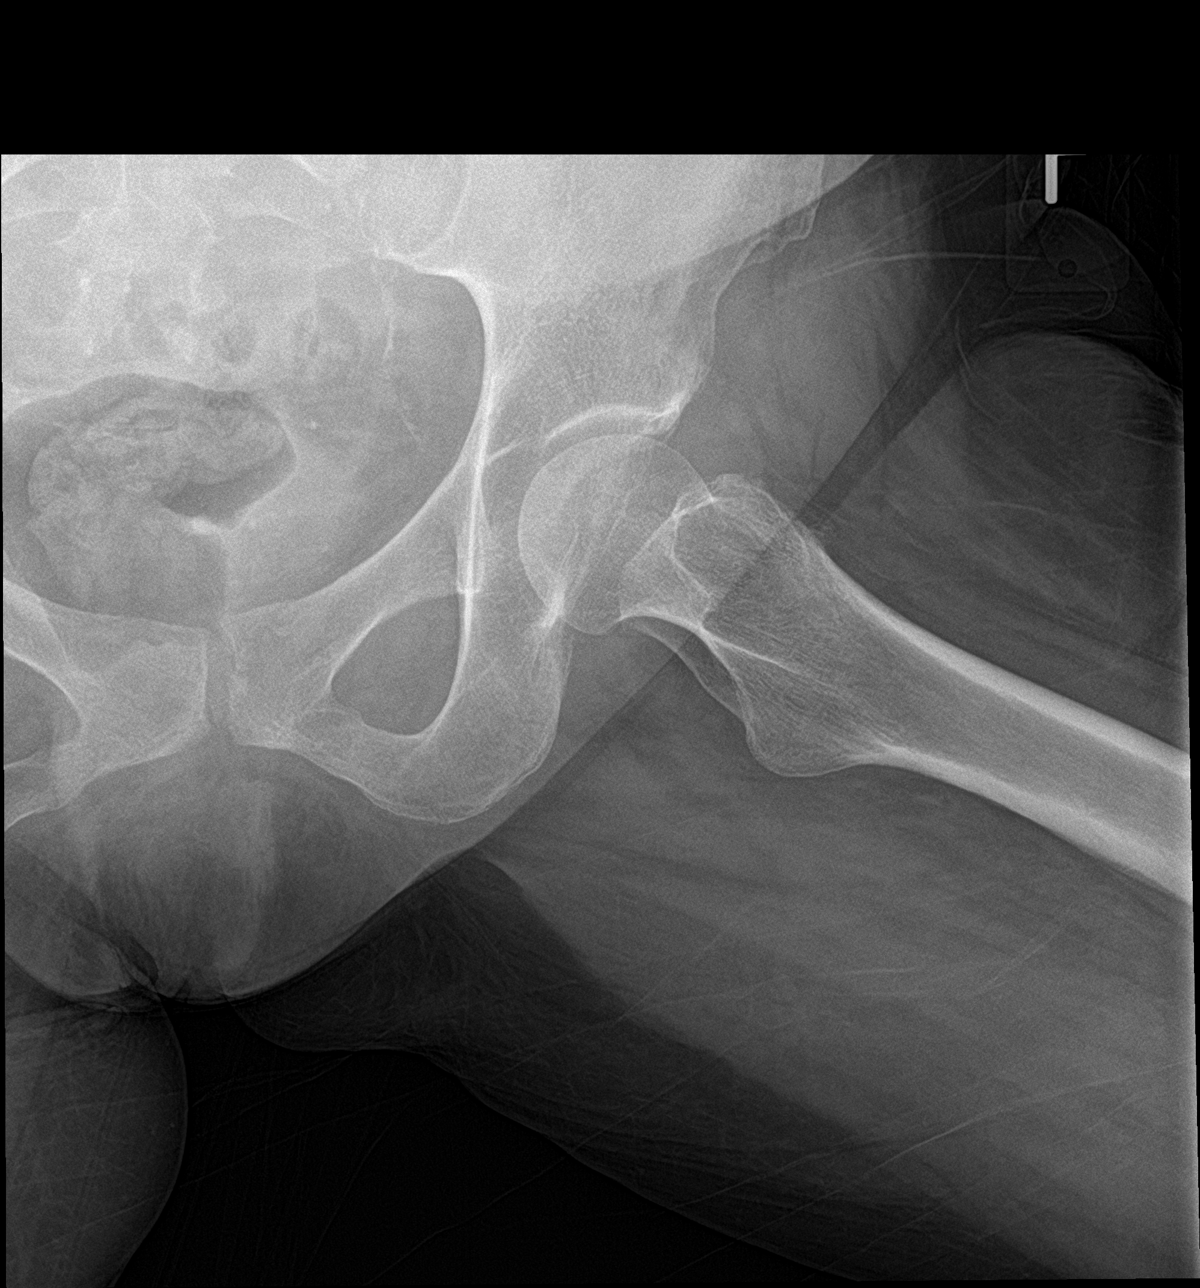

[3 of 3 positions shown; findings below may reference images not displayed]

FINDINGS: There is no evidence of hip fracture or dislocation. There is no
evidence of arthropathy or other focal bone abnormality.
IMPRESSION: No acute osseous injury of the left hip.

## 2018-03-30 IMAGING — CT CT HEAD W/O CM
3 series · 16 of 47 positions shown, 19 images · non-contrast
Comparison: 03/29/2015

CLINICAL DATA: Left-sided weakness

EXAM:
CT HEAD WITHOUT CONTRAST
TECHNIQUE: Contiguous axial images were obtained from the base of the skull
through the vertex without intravenous contrast.

[Series 2: head wo · axial · 0.44mm/px · z∈[+8,+138]mm · 10 of 32 slices shown, 13 images]
[im 3/32  brain]
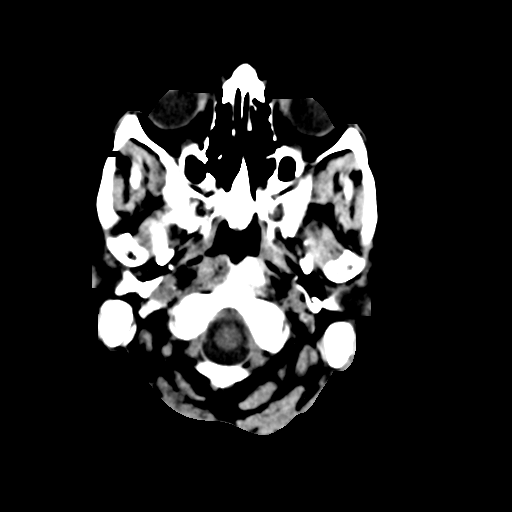
[im 3/32  bone]
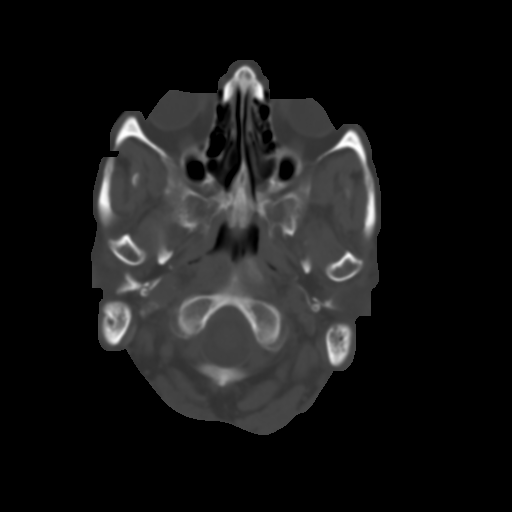
[im 6/32  brain]
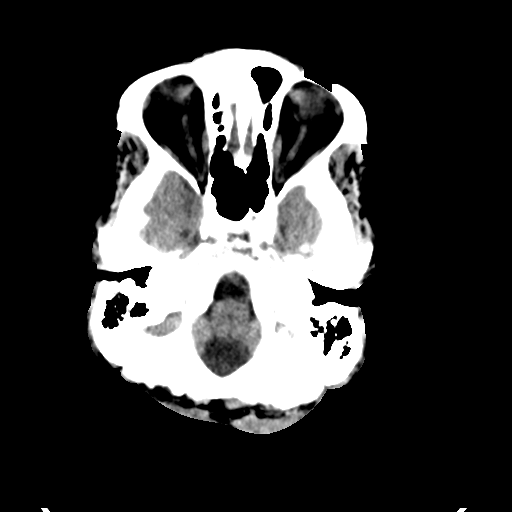
[im 9/32  brain]
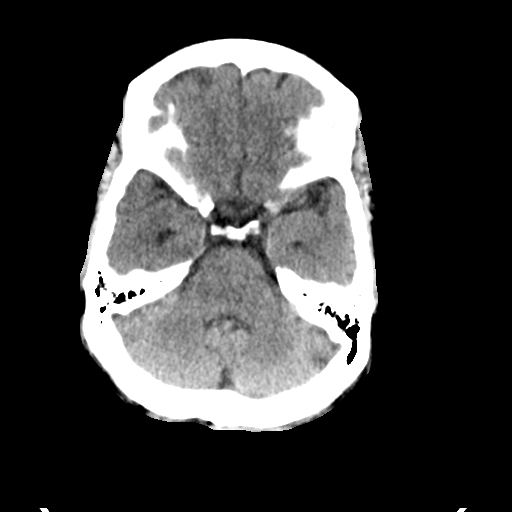
[im 11/32  brain]
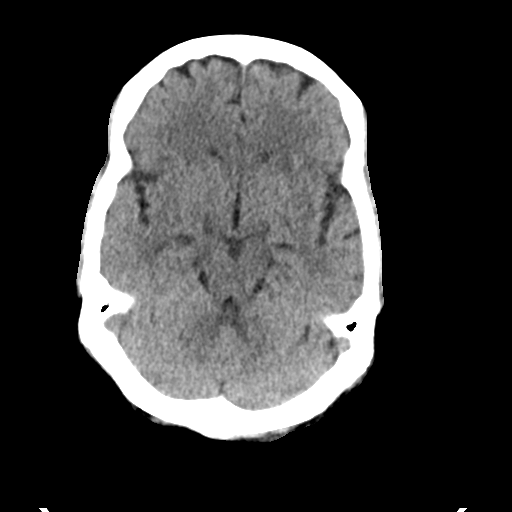
[im 14/32  brain]
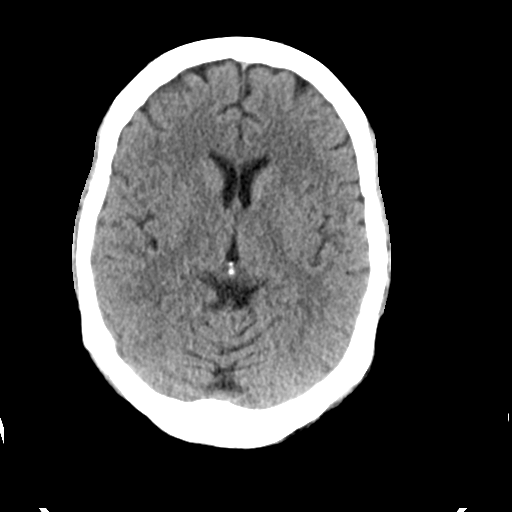
[im 14/32  bone]
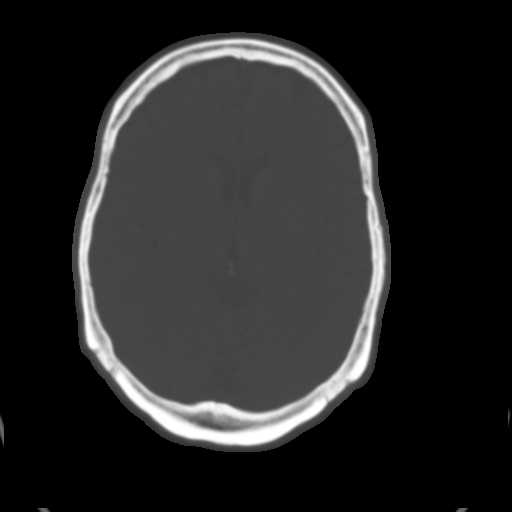
[im 18/32  brain]
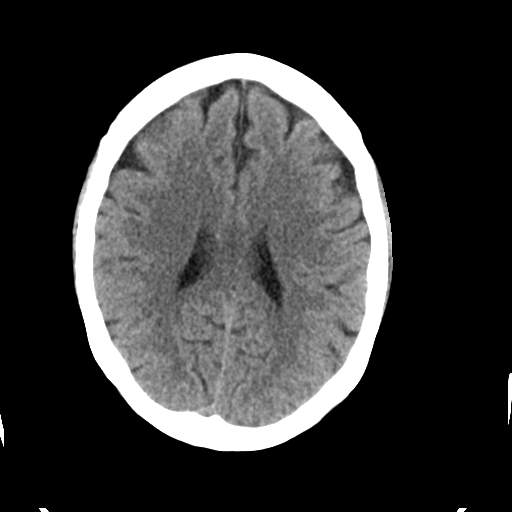
[im 21/32  brain]
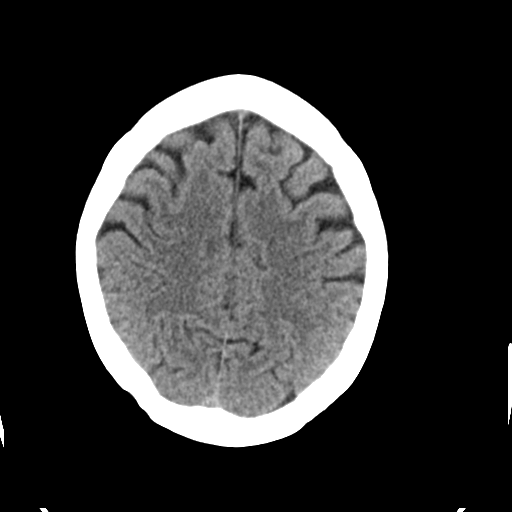
[im 24/32  brain]
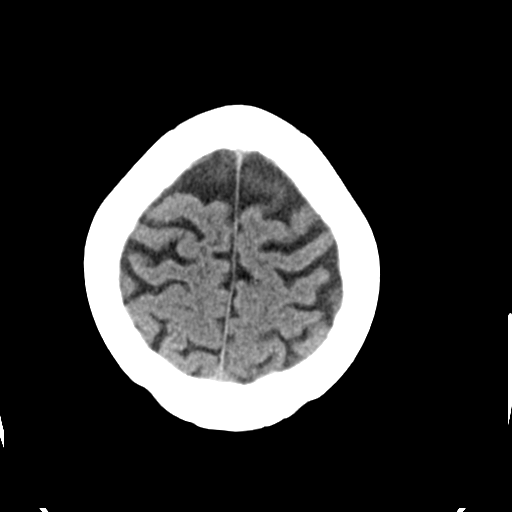
[im 26/32  brain]
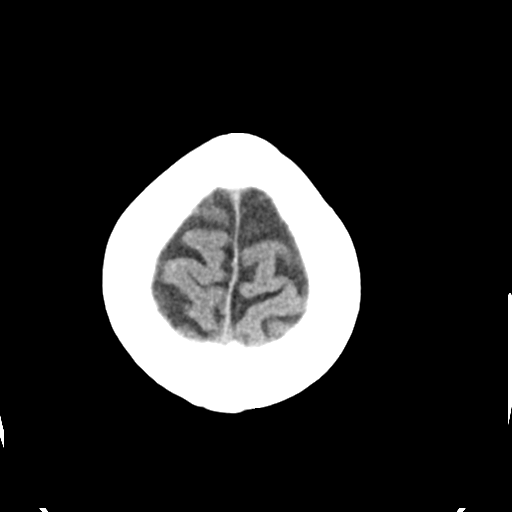
[im 26/32  bone]
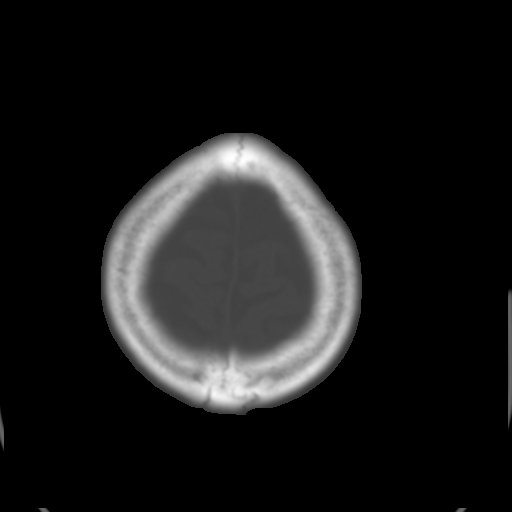
[im 29/32  brain]
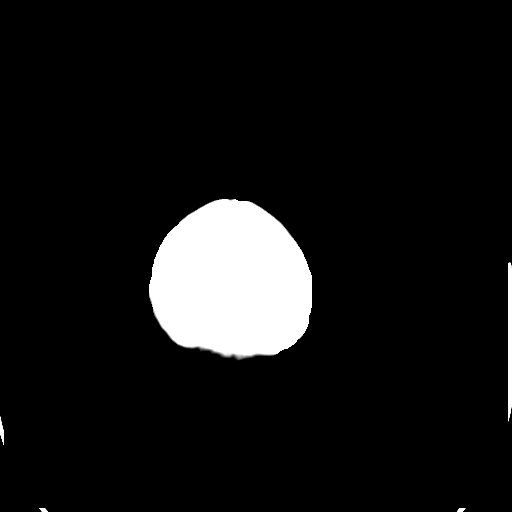

[Series 4: coronal soft tissue · coronal · 0.32mm/px · 3 of 67 slices shown]
[im 23/67  brain]
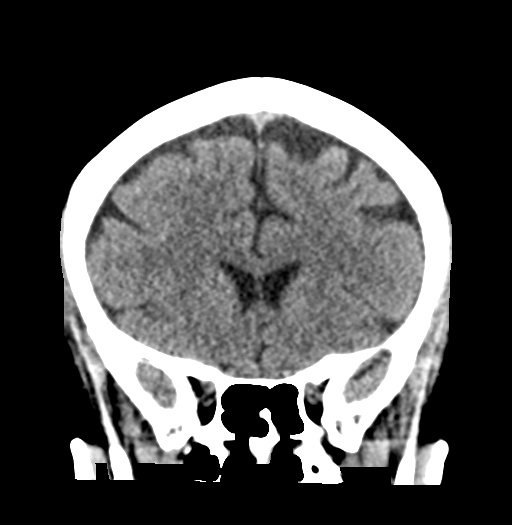
[im 30/67  brain]
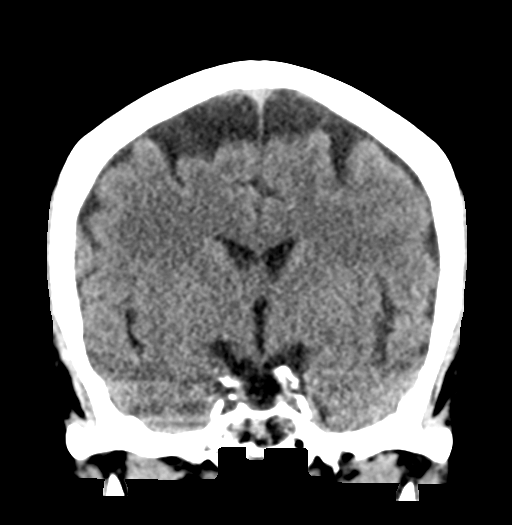
[im 37/67  brain]
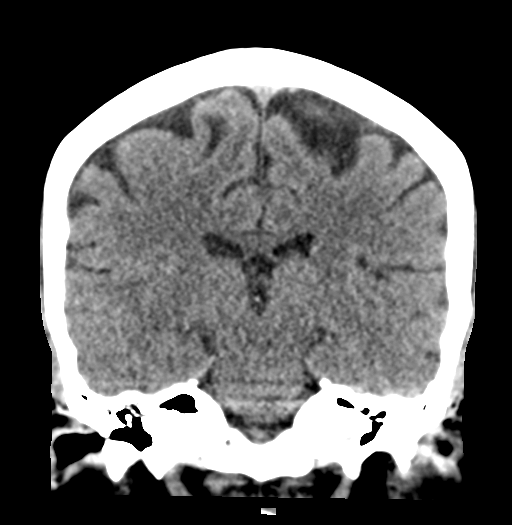

[Series 5: sagittal soft tissue · sagittal · 0.31mm/px · 3 of 56 slices shown]
[im 19/56  brain]
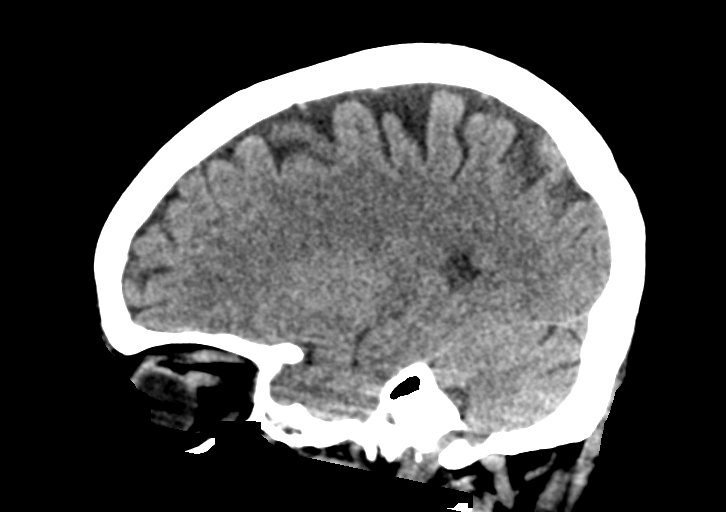
[im 28/56  brain]
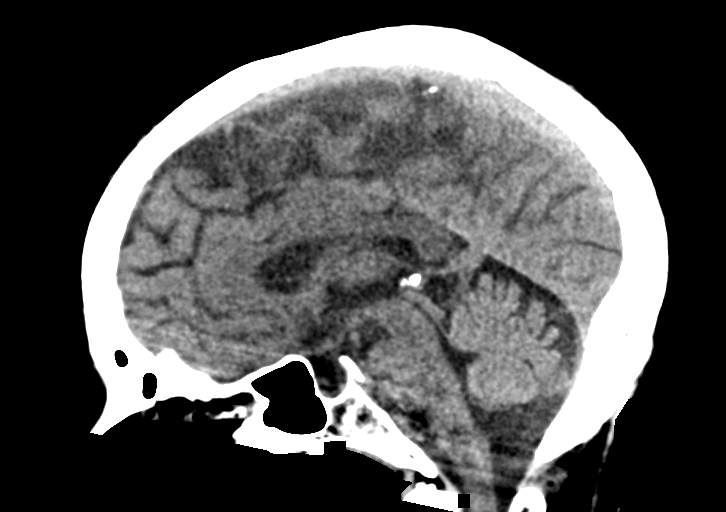
[im 37/56  brain]
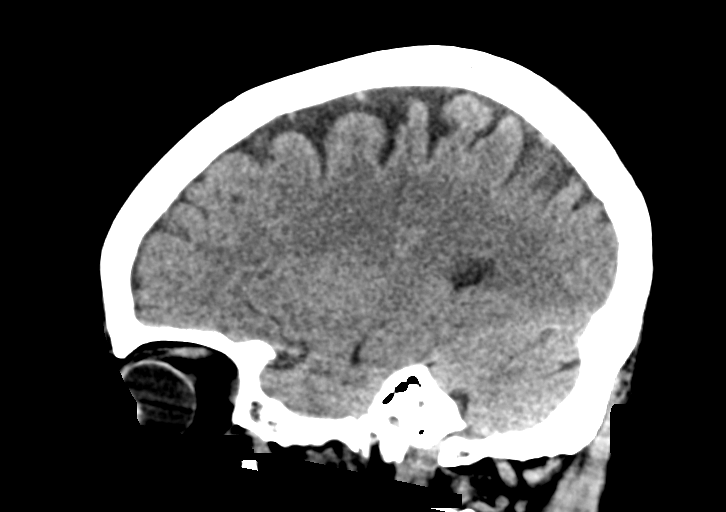

[16 of 47 positions shown; findings below may reference images not displayed]

FINDINGS: Brain: No evidence of acute infarction, hemorrhage, hydrocephalus,
extra-axial collection or mass lesion/mass effect.

Vascular: No hyperdense vessel.  Carotid artery calcification.

Skull: Normal. Negative for fracture or focal lesion.

Sinuses/Orbits: No acute finding.

Other: None
IMPRESSION: No CT evidence for acute intracranial abnormality.

## 2018-03-31 IMAGING — MR MR HEAD W/O CM
7 of 10 series · 31 of 48 positions shown · non-contrast
Comparison: CT 11/22/2016

CLINICAL DATA: Facial weakness

EXAM:
MRI HEAD WITHOUT CONTRAST
TECHNIQUE: Multiplanar, multiecho pulse sequences of the brain and surrounding
structures were obtained without intravenous contrast.

[Series 3: DWI · axial · 3.0mm · 0.74mm/px · z∈[-106,+53]mm · 7 of 55 slices shown (1 of 4)]
[im 1/55]
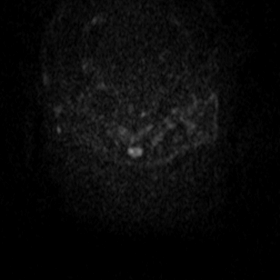
[im 10/55]
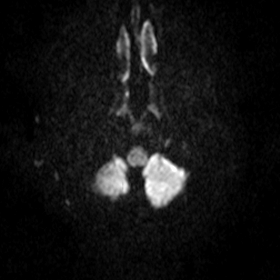
[im 19/55]
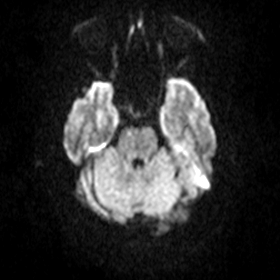
[im 28/55]
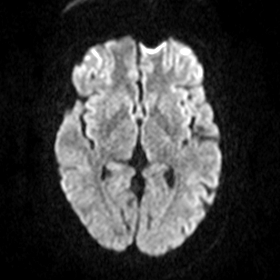
[im 37/55]
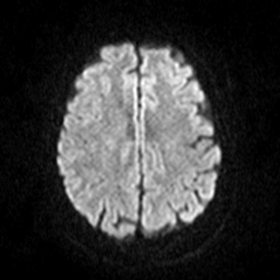
[im 46/55]
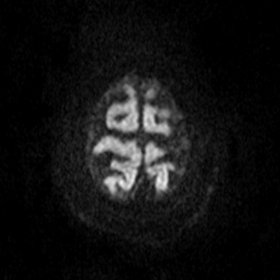
[im 55/55]
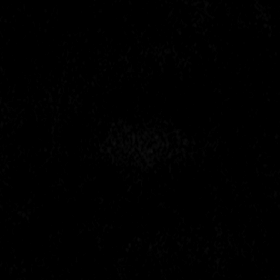

[Series 4: DWI · axial · 3.0mm · 0.74mm/px · z∈[-106,+50]mm · 6 of 54 slices shown (2 of 4)]
[im 1/54]
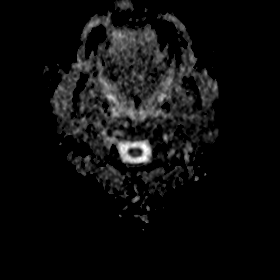
[im 11/54]
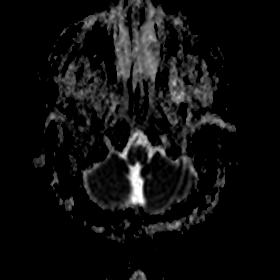
[im 22/54]
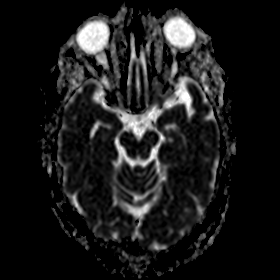
[im 32/54]
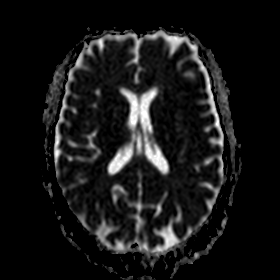
[im 43/54]
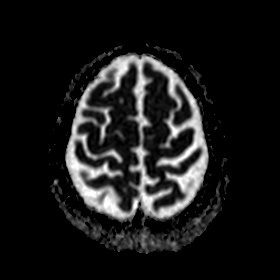
[im 54/54]
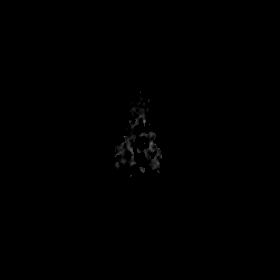

[Series 5: DWI · coronal · 5.0mm · 0.48mm/px · 4 of 36 slices shown (3 of 4)]
[im 1/36]
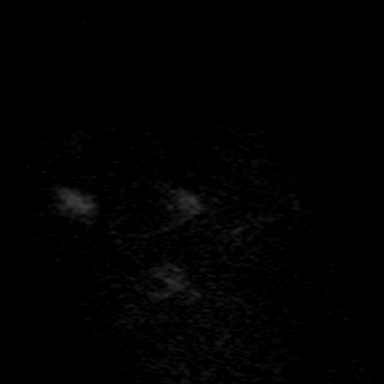
[im 12/36]
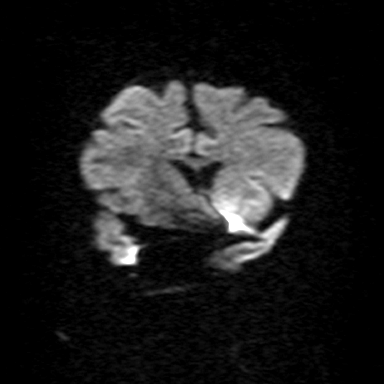
[im 24/36]
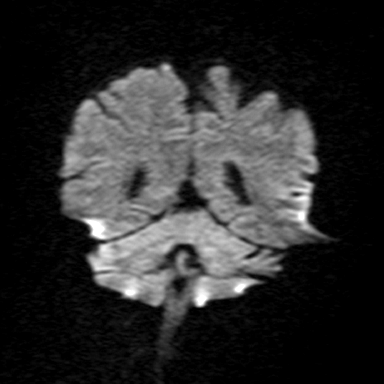
[im 36/36]
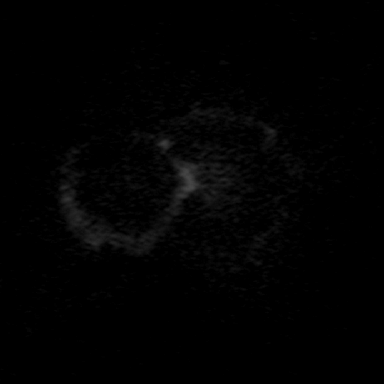

[Series 6: DWI · coronal · 5.0mm · 0.48mm/px · 4 of 36 slices shown (4 of 4)]
[im 1/36]
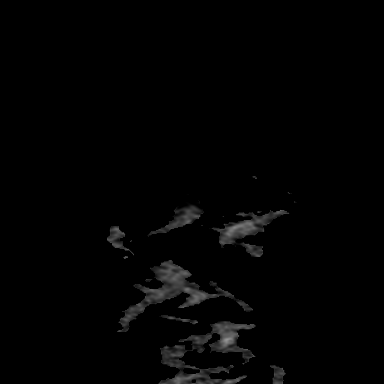
[im 12/36]
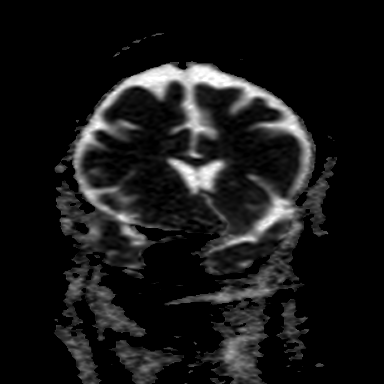
[im 24/36]
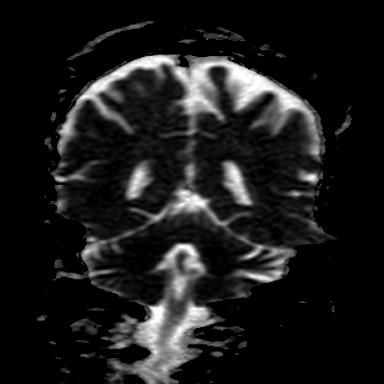
[im 36/36]
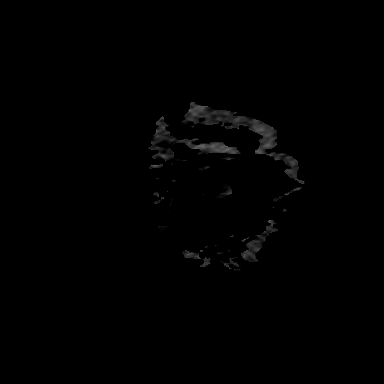

[Series 10: FLAIR · axial · 3.0mm · 0.33mm/px · z∈[-98,+51]mm · 6 of 52 slices shown]
[im 1/52]
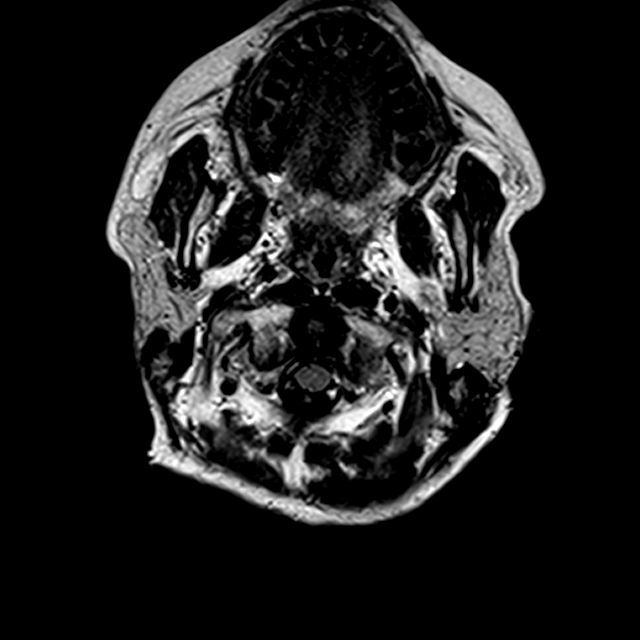
[im 11/52]
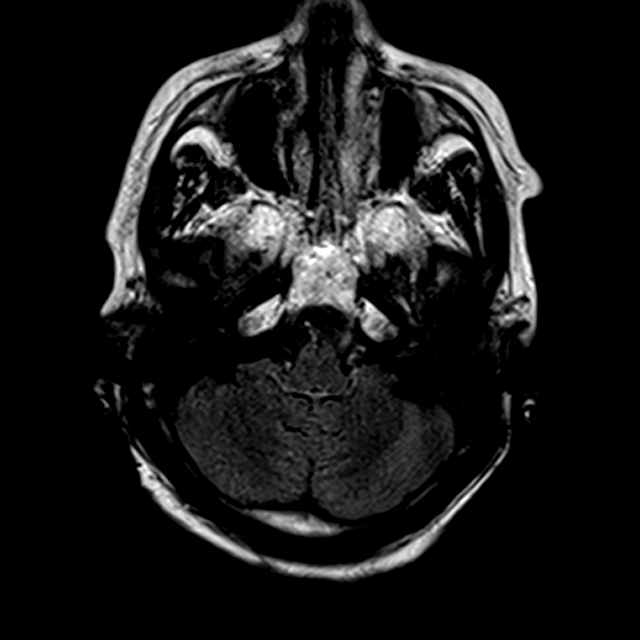
[im 21/52]
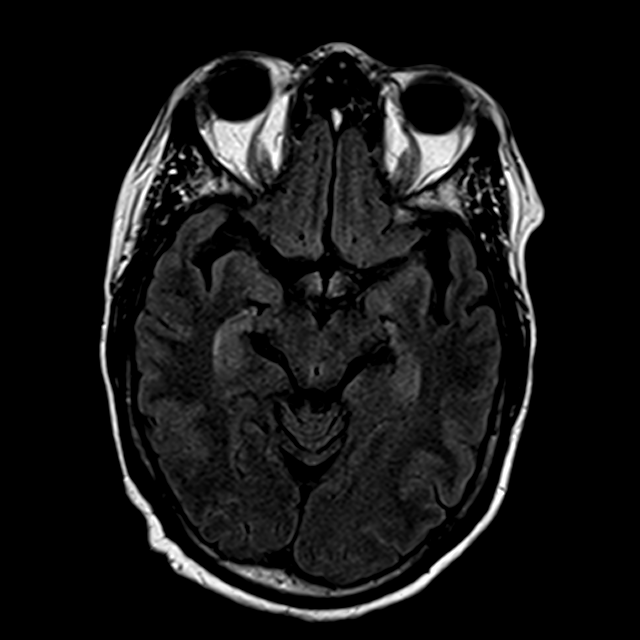
[im 31/52]
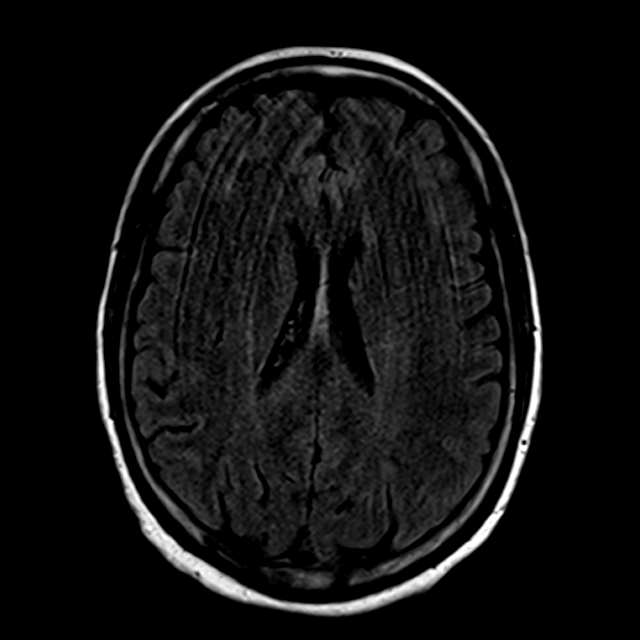
[im 41/52]
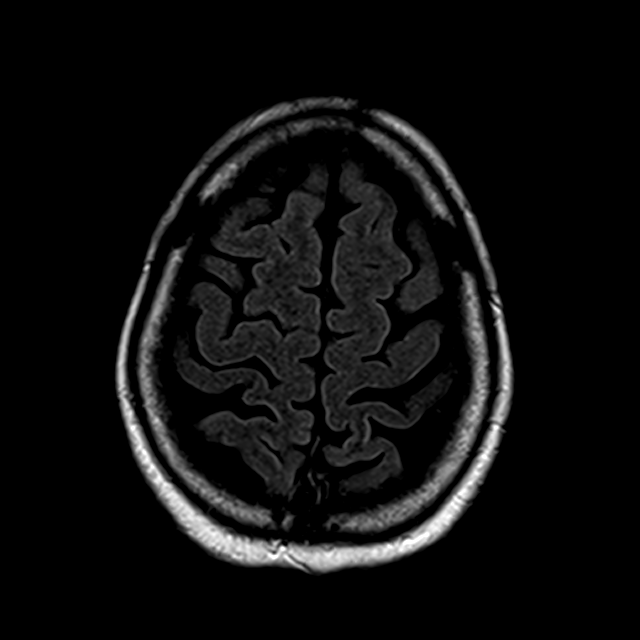
[im 52/52]
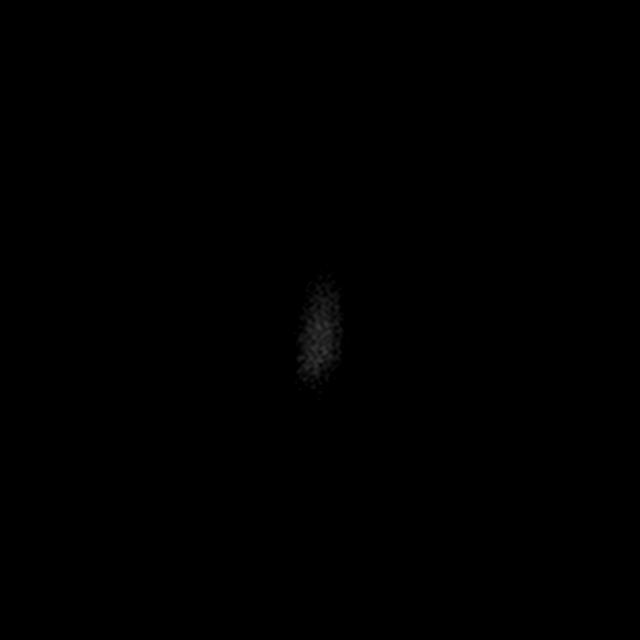

[Series 11: T2 · axial · 5.0mm · 0.48mm/px · z∈[-94,+46]mm · 3 of 23 slices shown (1 of 2)]
[im 1/23]
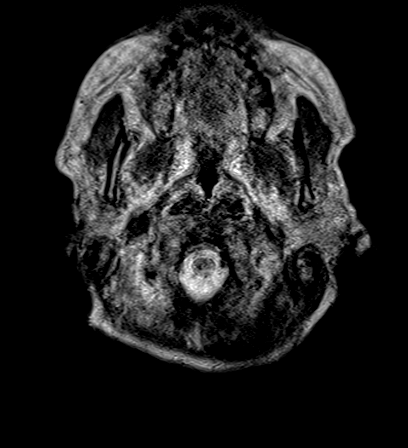
[im 12/23]
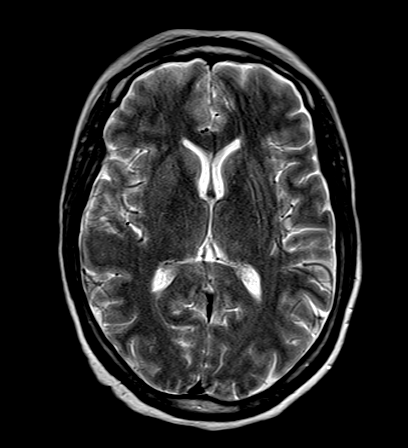
[im 23/23]
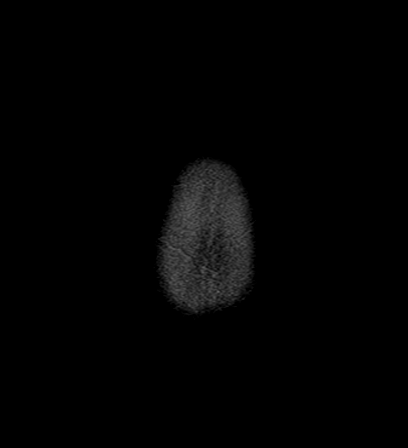

[Series 14: T2 · coronal · 5.0mm · 0.43mm/px · 1 of 28 slices shown (2 of 2)]
[im 1/28]
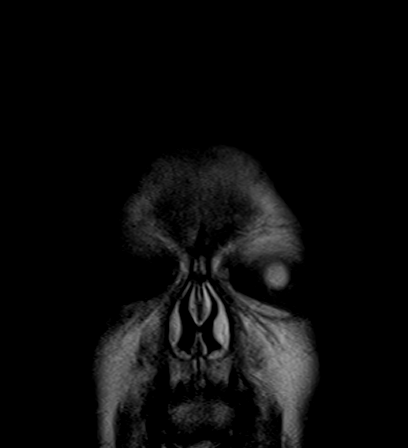

[31 of 48 positions shown; findings below may reference images not displayed]

FINDINGS: Brain: No acute infarction, hemorrhage, hydrocephalus, extra-axial
collection or mass lesion.

Image quality degraded by motion.

Vascular: Normal arterial flow voids

Skull and upper cervical spine: Negative

Sinuses/Orbits: Mild mucosal edema paranasal sinuses.  Normal orbit

Other: None
IMPRESSION: Negative MRI head.  Image quality degraded by motion.

## 2018-04-01 IMAGING — MR MR CERVICAL SPINE W/O CM
4 of 5 series · 14 of 48 positions shown · non-contrast
Comparison: None.

CLINICAL DATA: Left-sided weakness and numbness

EXAM:
MRI CERVICAL SPINE WITHOUT CONTRAST
TECHNIQUE: Multiplanar, multisequence MR imaging of the cervical spine was
performed. No intravenous contrast was administered.

[Series 3: T2 · sagittal · 3.0mm · 0.40mm/px · 5 of 13 slices shown (1 of 2)]
[im 1/13]
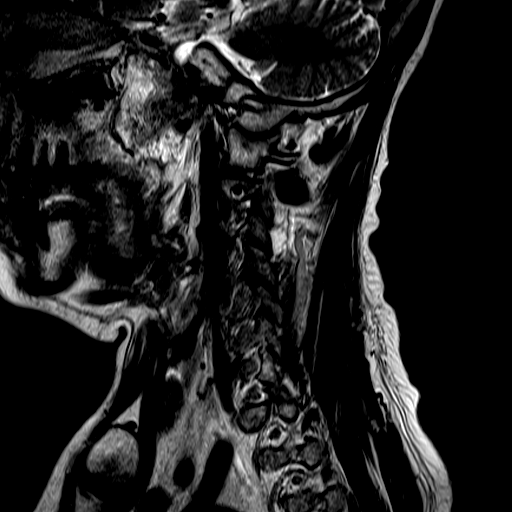
[im 4/13]
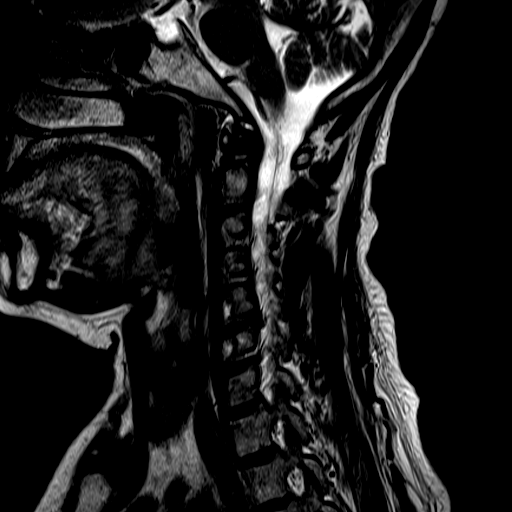
[im 7/13]
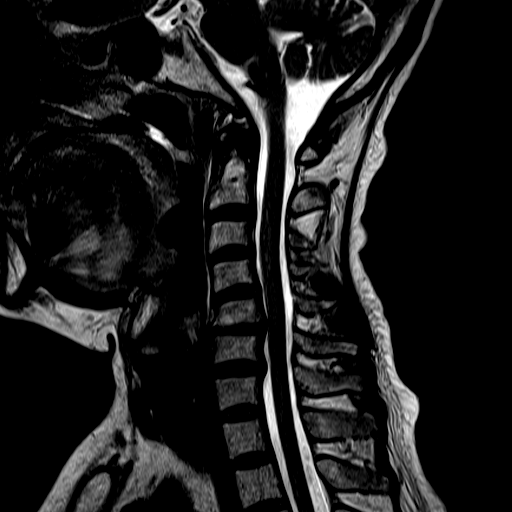
[im 10/13]
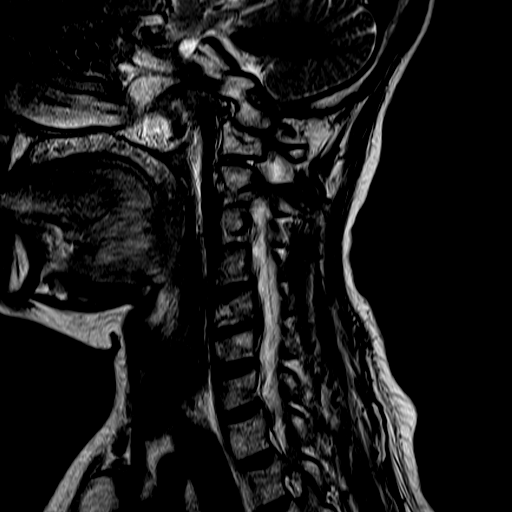
[im 13/13]
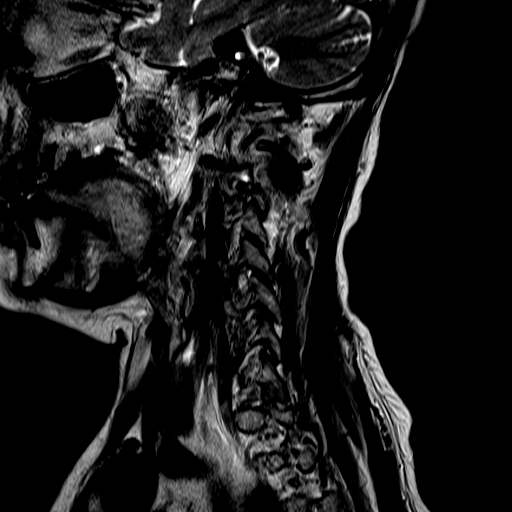

[Series 4: FLAIR · sagittal · 3.0mm · 0.43mm/px · 3 of 13 slices shown]
[im 1/13]
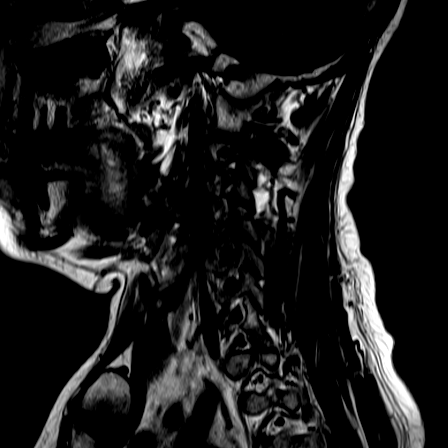
[im 7/13]
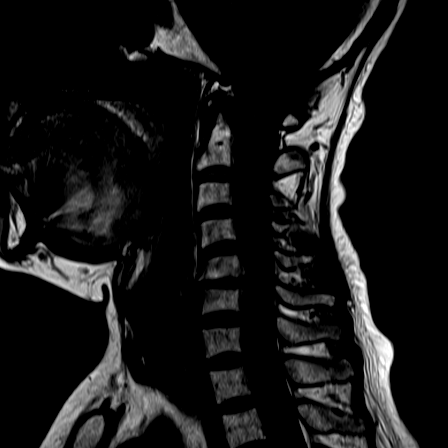
[im 13/13]
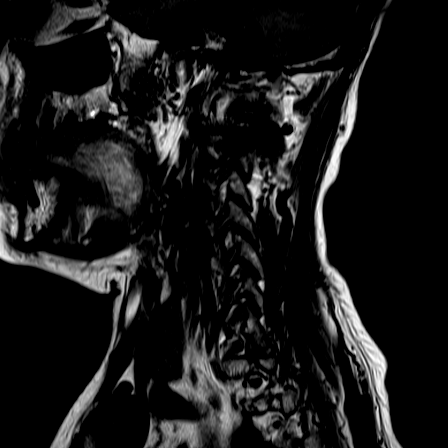

[Series 5: ir sagital · sagittal · 3.0mm · 0.23mm/px · 3 of 13 slices shown]
[im 3/13]
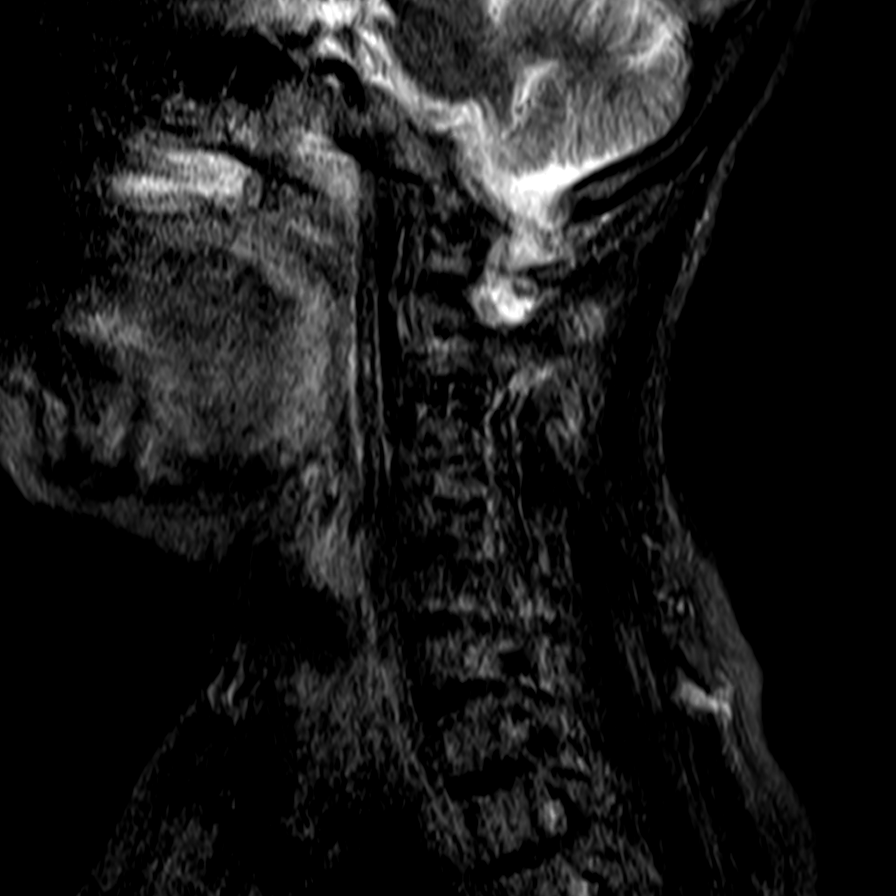
[im 8/13]
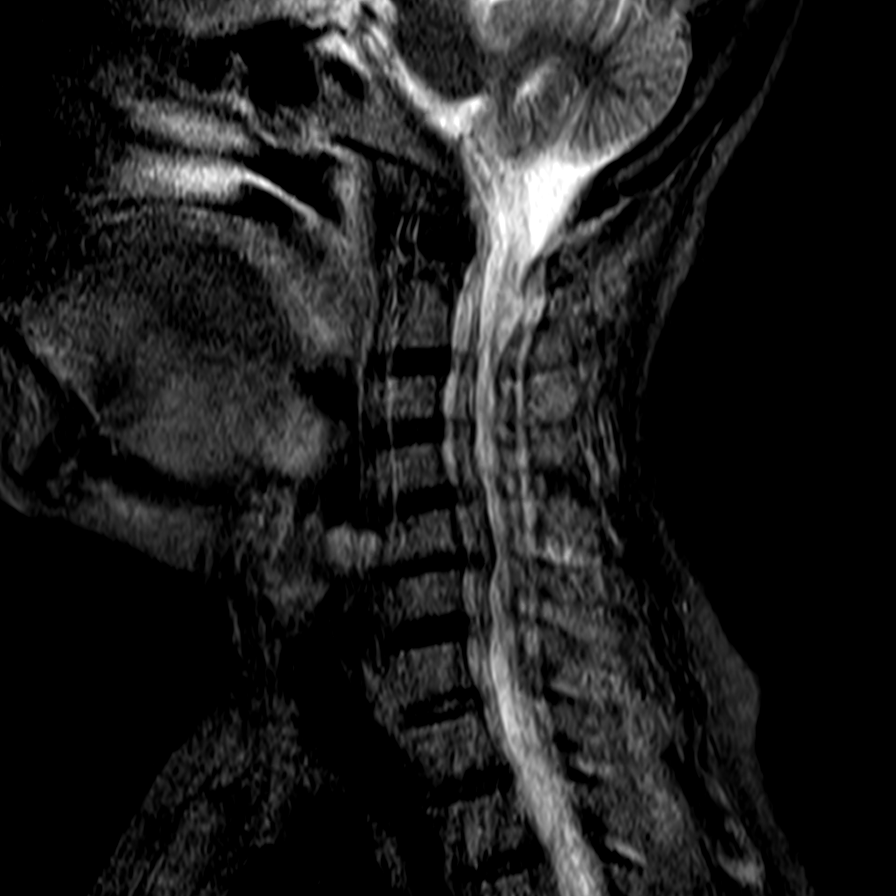
[im 13/13]
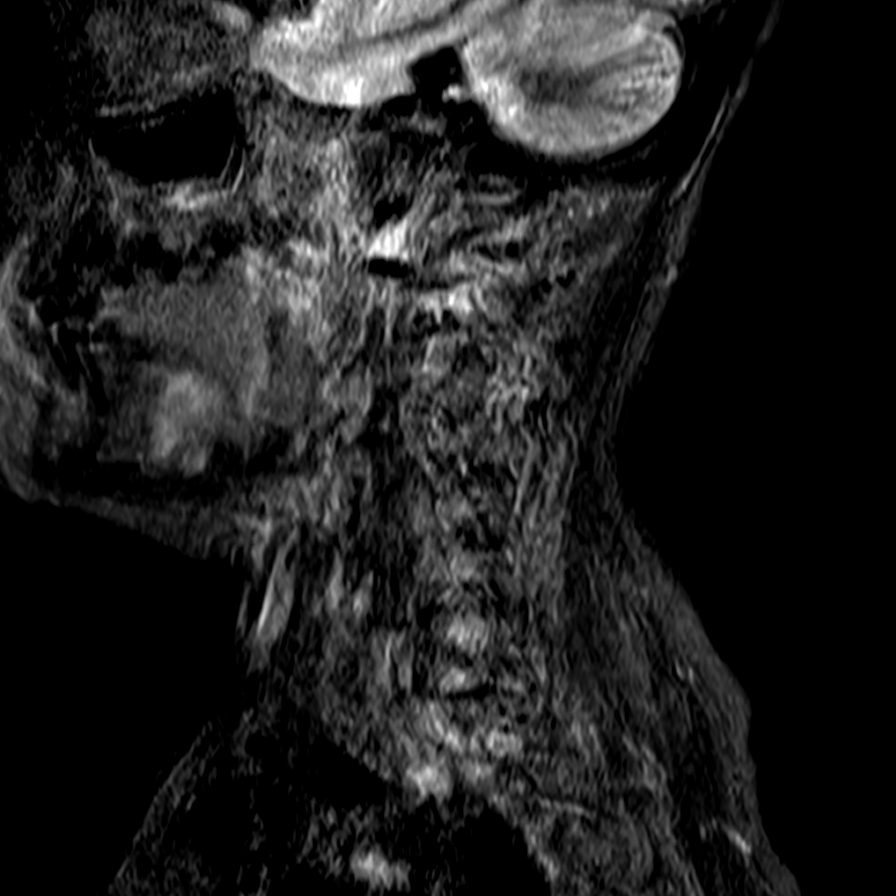

[Series 7: T2 · axial · 3.0mm · 0.21mm/px · z∈[-15,+69]mm · 3 of 36 slices shown (2 of 2)]
[im 5/36]
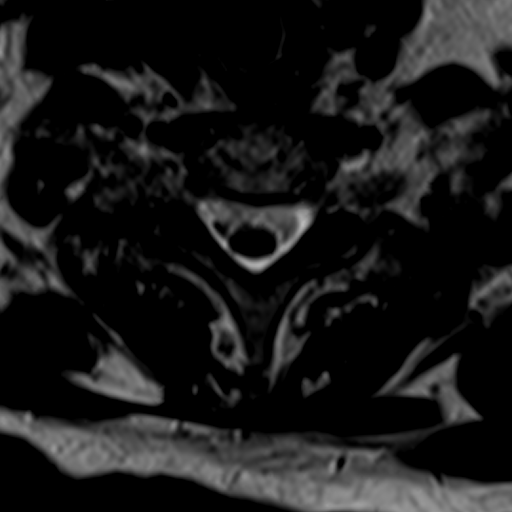
[im 19/36]
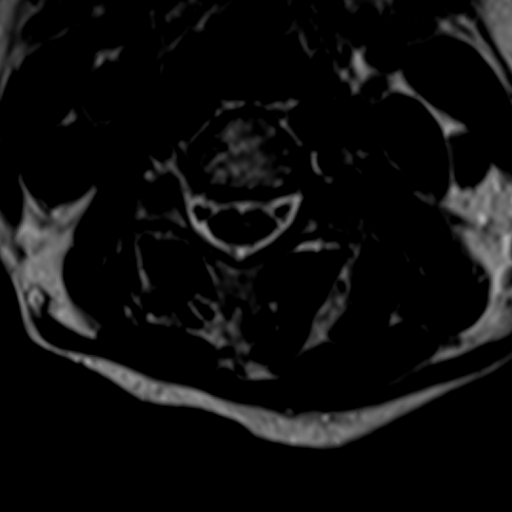
[im 31/36]
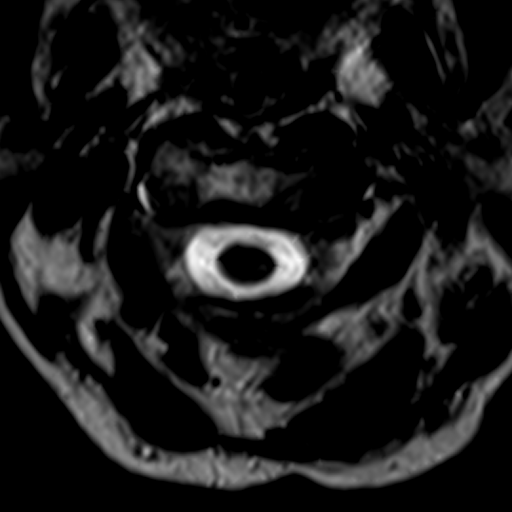

[14 of 48 positions shown; findings below may reference images not displayed]

FINDINGS: Alignment: Physiologic.

Vertebrae: No fracture, evidence of discitis, or bone lesion.

Cord: Normal signal and morphology.

Posterior Fossa, vertebral arteries, paraspinal tissues: Posterior
fossa demonstrates no focal abnormality. Vertebral artery flow voids
are maintained. Paraspinal soft tissues are unremarkable.

Disc levels:

Discs: Degenerative disc disease with disc height loss at C5-6.

C2-3: No significant disc bulge. No neural foraminal stenosis. No
central canal stenosis.

C3-4: No significant disc bulge. No neural foraminal stenosis. No
central canal stenosis.

C4-5: No significant disc bulge. No neural foraminal stenosis. No
central canal stenosis.

C5-6: Mild broad-based disc bulge. Bilateral uncovertebral
degenerative changes. Mild bilateral foraminal stenosis. No central
canal stenosis.

C6-7: Shallow left paracentral disc protrusion. No neural foraminal
stenosis. No central canal stenosis.

C7-T1: No significant disc bulge. No neural foraminal stenosis. No
central canal stenosis.
IMPRESSION: 1. At C6-7 there is a shallow left paracentral disc protrusion.
2. At C5-6 there is a mild broad-based disc bulge. Bilateral
uncovertebral degenerative changes. Mild bilateral foraminal
stenosis.

## 2018-04-02 IMAGING — CT CT ABD-PELV W/ CM
2 of 4 series · 15 of 46 positions shown, 17 images · IV contrast (Isovue)
Comparison: 09/23/2016

CLINICAL DATA: Upper to mid abdominal pain beginning 3 days ago.
Recent stroke.

EXAM:
CT ABDOMEN AND PELVIS WITH CONTRAST
TECHNIQUE: Multidetector CT imaging of the abdomen and pelvis was performed
using the standard protocol following bolus administration of
intravenous contrast.
CONTRAST:  30mL H3TPMV-HOO IOPAMIDOL (H3TPMV-HOO) INJECTION 61%,
100mL H3TPMV-HOO IOPAMIDOL (H3TPMV-HOO) INJECTION 61%

[Series 2: axial st · axial · 0.71mm/px · z∈[-686,-232]mm · 12 of 101 slices shown, 14 images]
[im 5/101  soft-tissue]
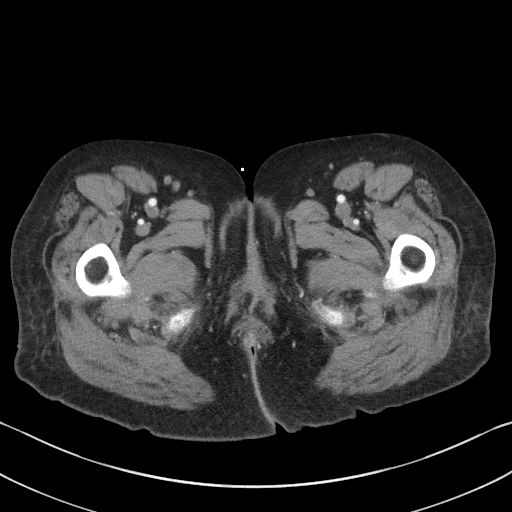
[im 5/101  bone]
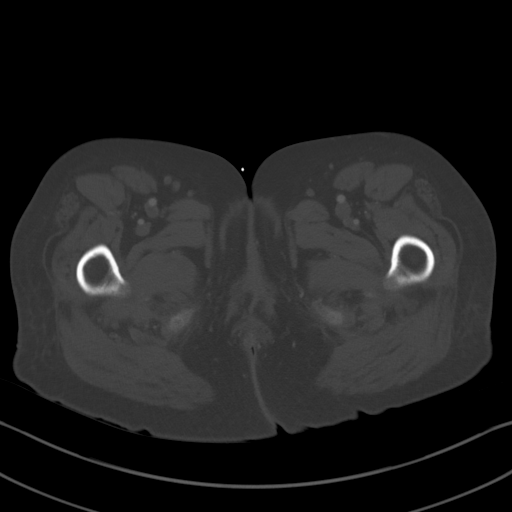
[im 14/101  soft-tissue]
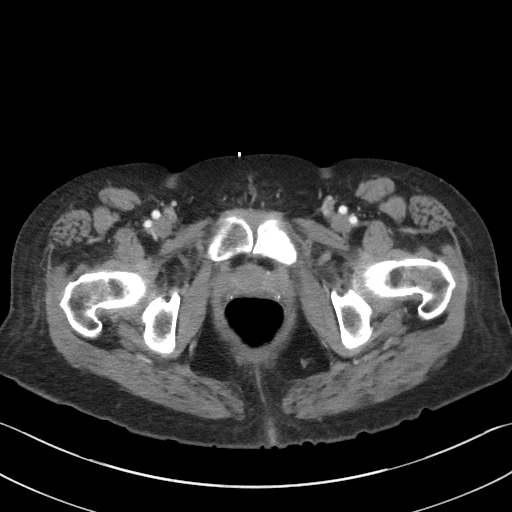
[im 23/101  soft-tissue]
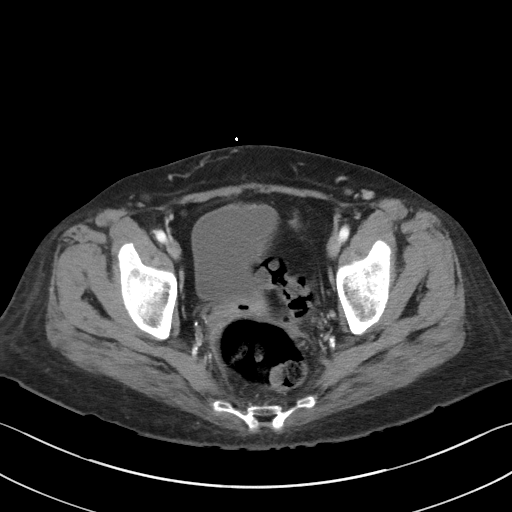
[im 32/101  soft-tissue]
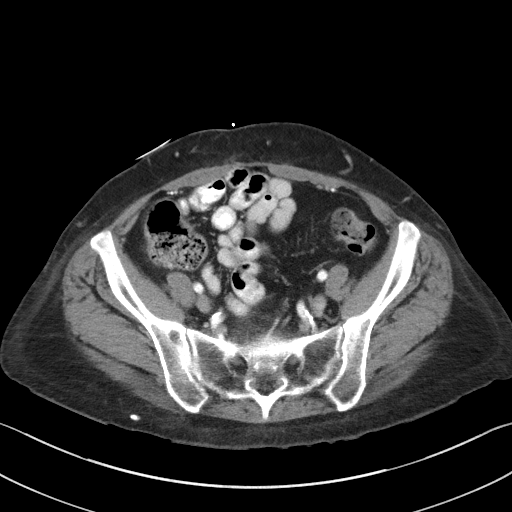
[im 37/101  soft-tissue]
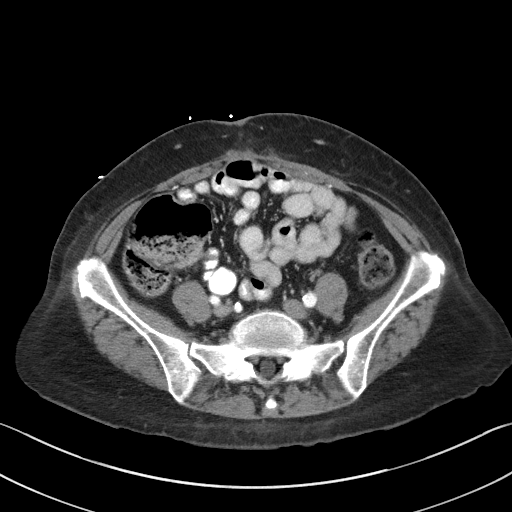
[im 46/101  soft-tissue]
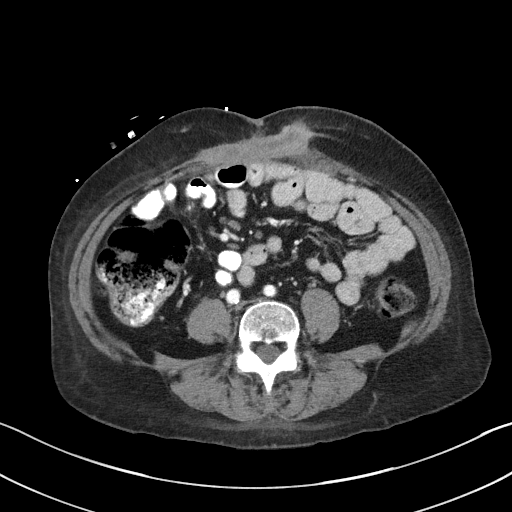
[im 55/101  soft-tissue]
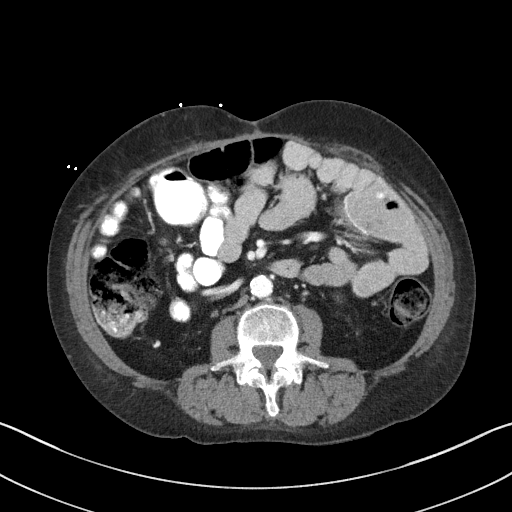
[im 64/101  soft-tissue]
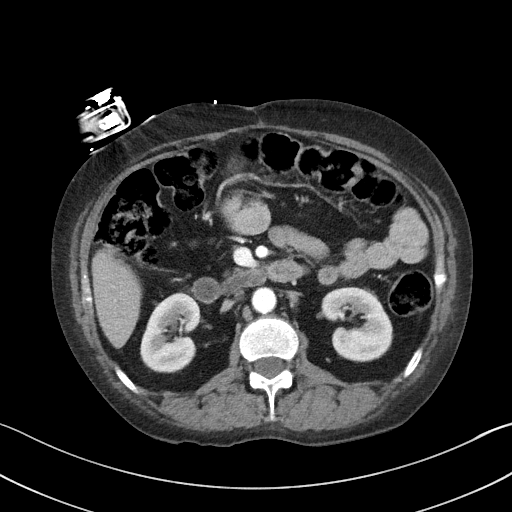
[im 69/101  soft-tissue]
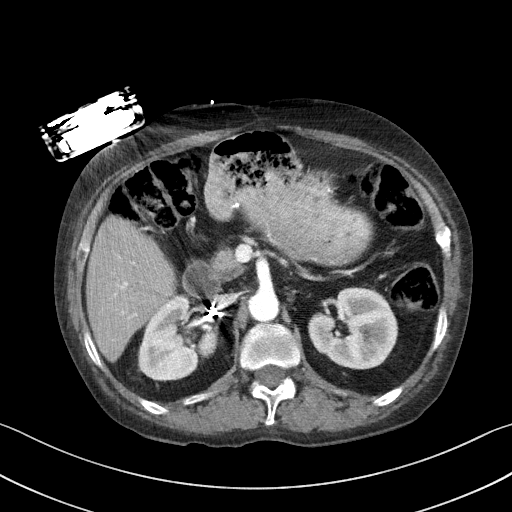
[im 69/101  bone]
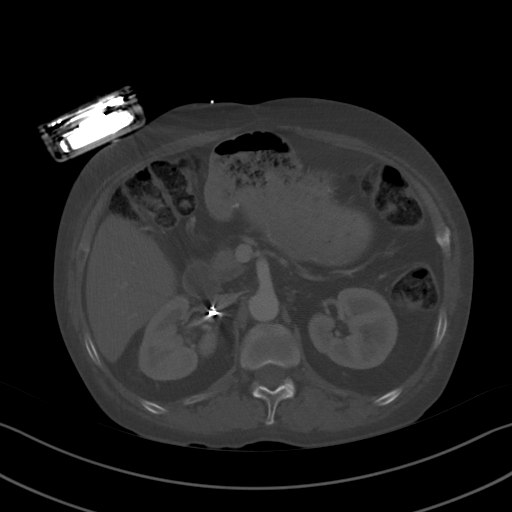
[im 78/101  soft-tissue]
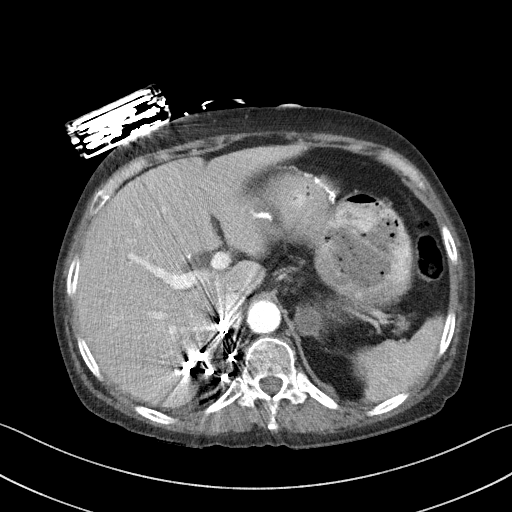
[im 87/101  soft-tissue]
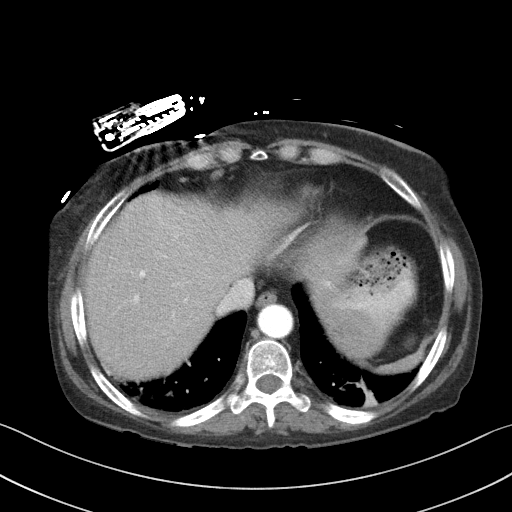
[im 96/101  soft-tissue]
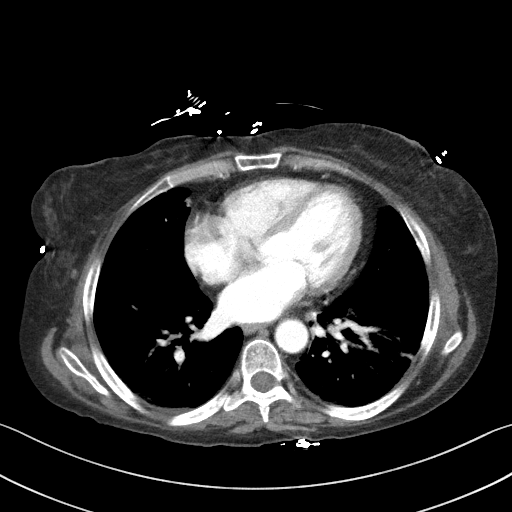

[Series 6: coronal st · coronal · 0.73mm/px · 3 of 103 slices shown]
[im 35/103  soft-tissue]
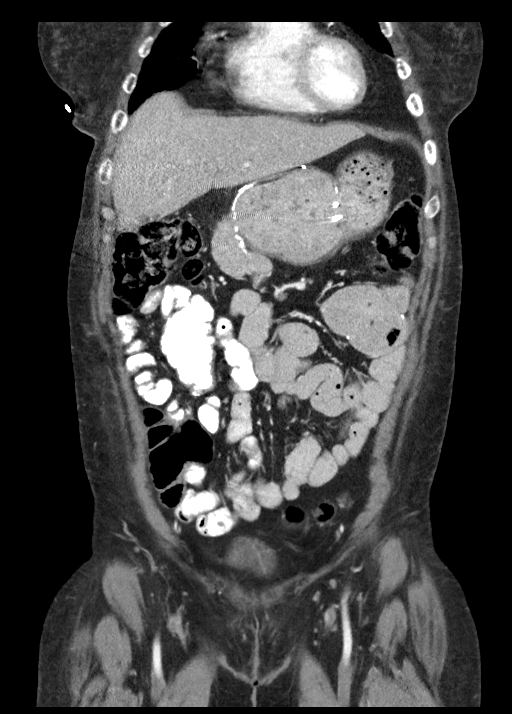
[im 46/103  soft-tissue]
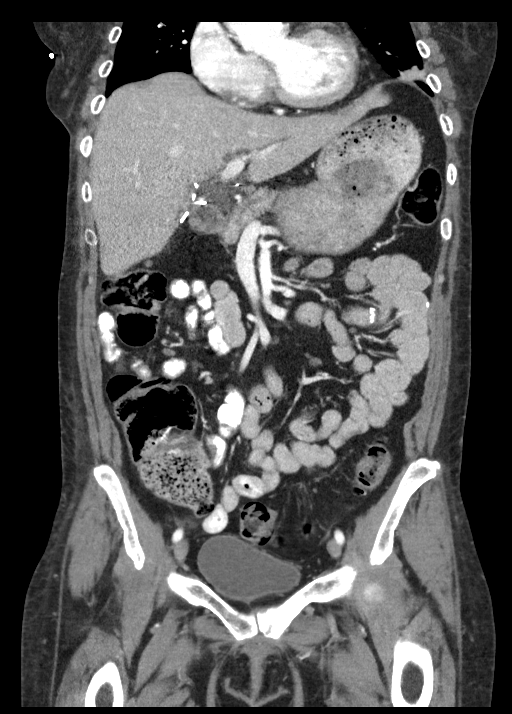
[im 57/103  soft-tissue]
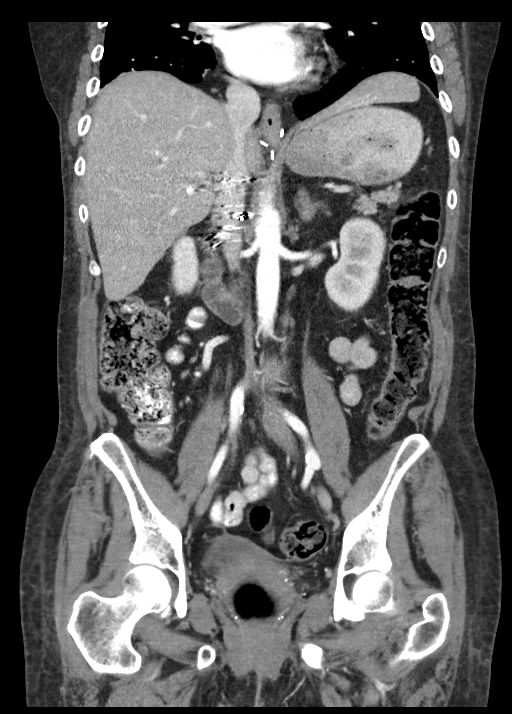

[15 of 46 positions shown; findings below may reference images not displayed]

FINDINGS: Lower chest: Calcified granuloma right middle lobe. Atelectasis and
both lower lobes left more than right. No pleural or pericardial
fluid.

Hepatobiliary: No liver parenchymal lesion is seen. Previous
cholecystectomy. Less fatty change of the liver than seen
previously.

Pancreas: Some atrophic changes of the pancreas. No sign of mass or
inflammation.

Spleen: Normal

Adrenals/Urinary Tract: Left adrenal gland appears surgically
absent. Right adrenal gland contains a low-density mass measuring
1.9 cm in diameter, unchanged since the previous study and
consistent with a benign adenoma. No renal parenchymal lesion is
seen.

Stomach/Bowel: Previous gastric bypass surgery. The stomach is full
of material, presumably due to recent meal. No evidence of ileus,
obstruction or free air. No other acute bowel finding.

Vascular/Lymphatic: Aortic atherosclerosis. No aneurysm. IVC is
normal. No retroperitoneal mass or adenopathy otherwise.

Reproductive: Previous hysterectomy.  No pelvic mass.

Other: No free fluid or air. Scarring of the anterior abdominal wall
presumably related to previous surgeries.

Musculoskeletal: Sclerotic change of the superior and inferior rami
on the left consistent with insufficiency fractures or healing post
traumatic fractures.
IMPRESSION: No cause of acute abdominal pain is identified. Previous gastric
bypass surgery. The stomach is full of material, presumably due to
recent meal. Previous cholecystectomy. Previous right adrenal
resection. Scarring of the anterior abdominal wall which appears the
same as on the previous exam.

Atelectasis in both lower lungs, left more than right.

Chronic left adrenal adenoma.

Aortic atherosclerosis.

Healing fractures of the superior and inferior rami on the left,
post traumatic versus insufficiency. Not present in [REDACTED] of this
year.

## 2018-04-03 IMAGING — CT CT ANGIO HEAD
4 of 14 series · 12 of 47 positions shown · IV contrast (Isovue)
Comparison: Previous MRI from 11/23/2016.

CLINICAL DATA: Initial evaluation for left-sided weakness for 1
week.

EXAM:
CT ANGIOGRAPHY HEAD
TECHNIQUE: Multidetector CT imaging of the head was performed using the
standard protocol during bolus administration of intravenous
contrast. Multiplanar CT image reconstructions and MIPs were
obtained to evaluate the vascular anatomy.
CONTRAST:  75 cc of Isovue 370.

[Series 11: cta head · axial · 0.41mm/px · z∈[+550,+600]mm · 2 of 75 slices shown]
[im 25/75  brain]
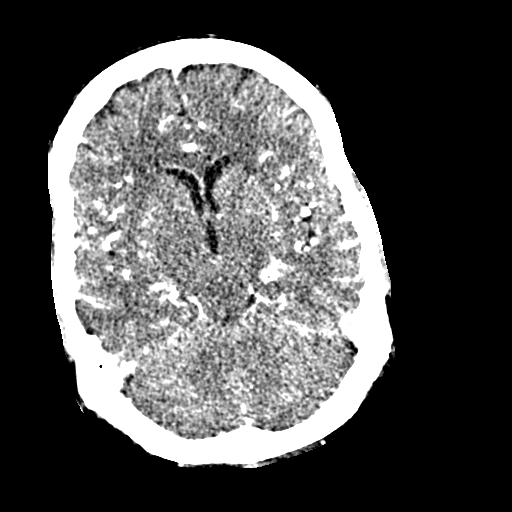
[im 50/75  brain]
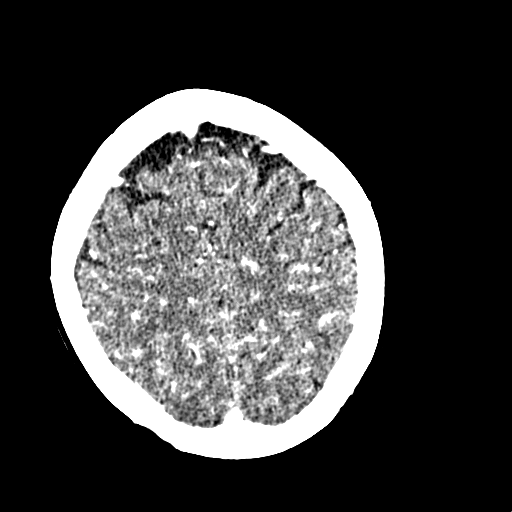

[Series 13: ax thin · axial · 0.39mm/px · z∈[+511,+609]mm · 5 of 149 slices shown]
[im 25/149  brain]
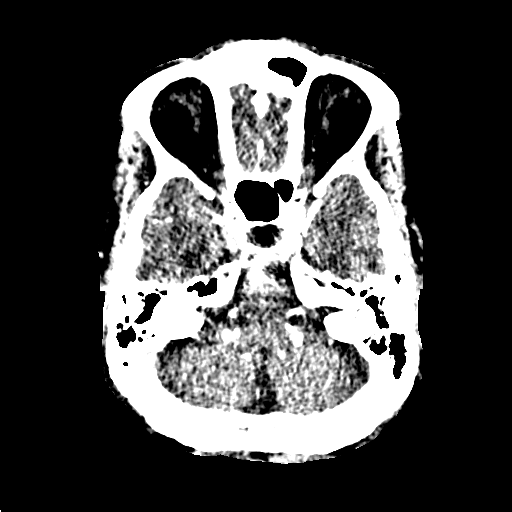
[im 50/149  bone]
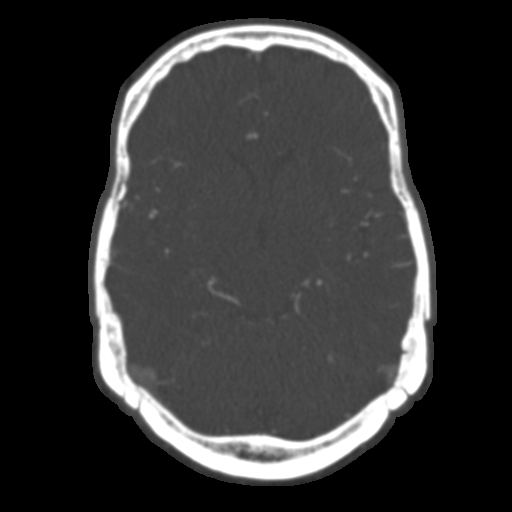
[im 75/149  brain]
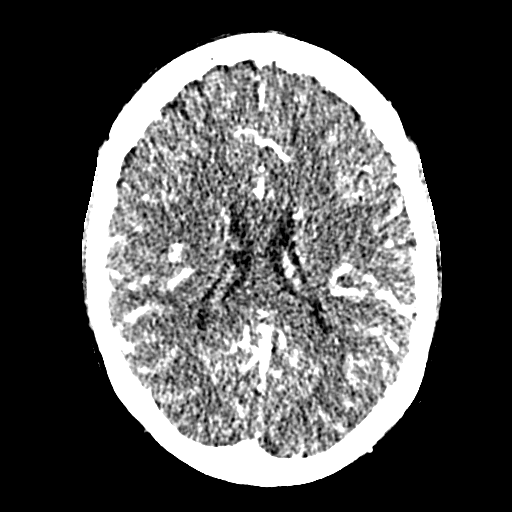
[im 99/149  bone]
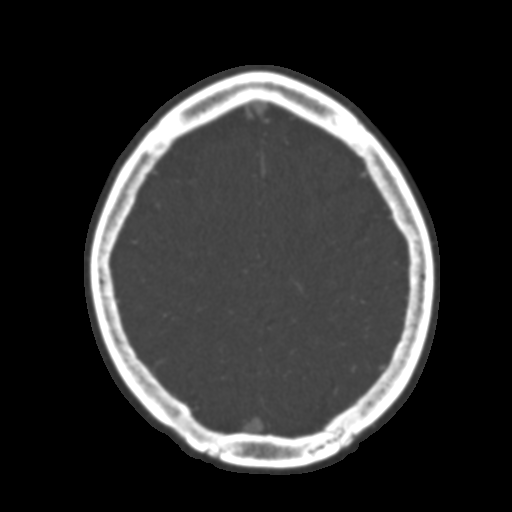
[im 124/149  brain]
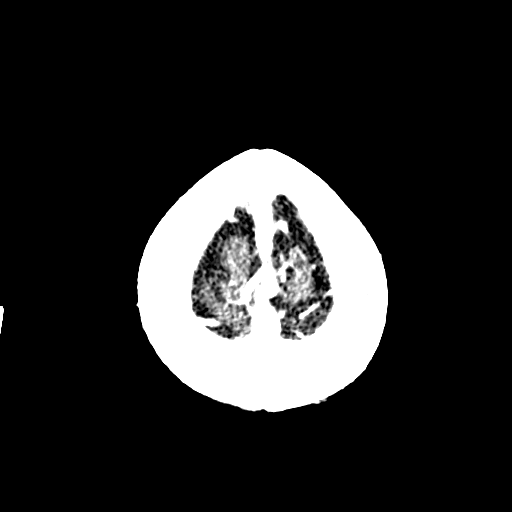

[Series 15: cor thin · coronal · 0.33mm/px · 3 of 201 slices shown]
[im 51/201  brain]
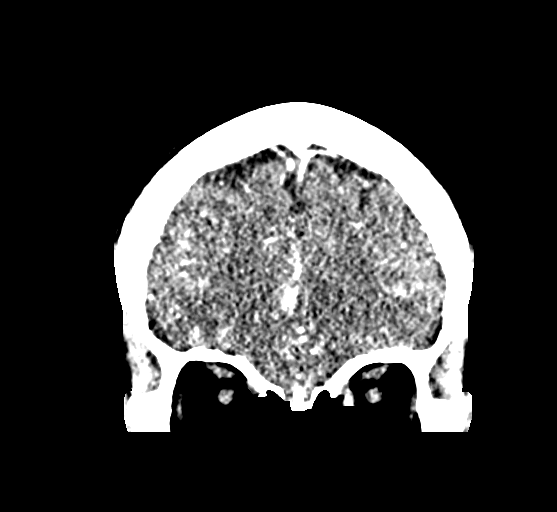
[im 101/201  brain]
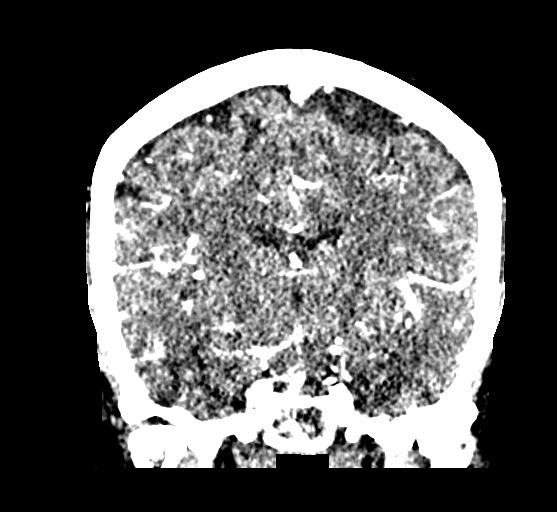
[im 151/201  brain]
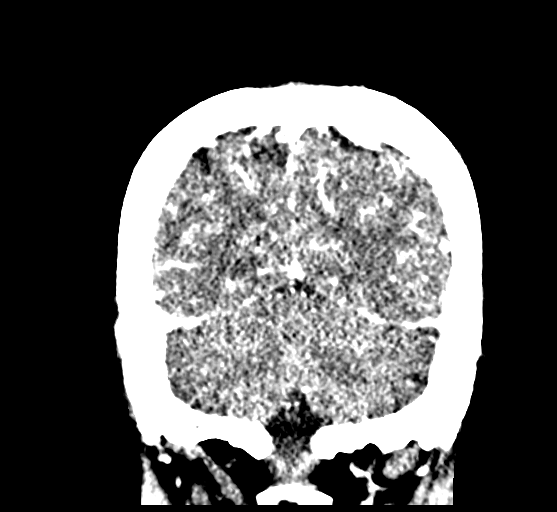

[Series 17: sag thin · sagittal · 0.42mm/px · 2 of 164 slices shown]
[im 55/164  brain]
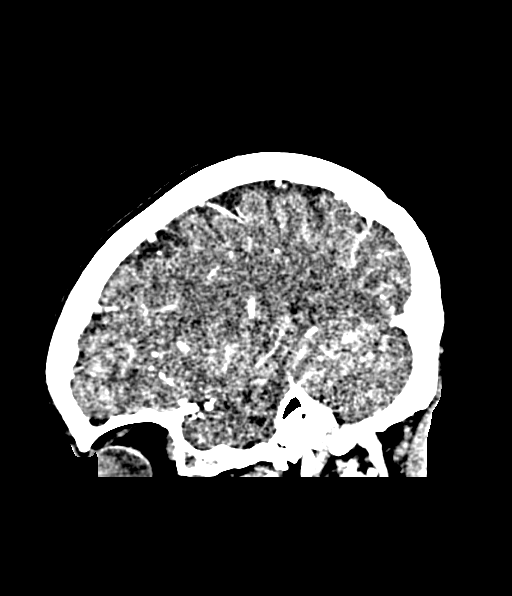
[im 109/164  brain]
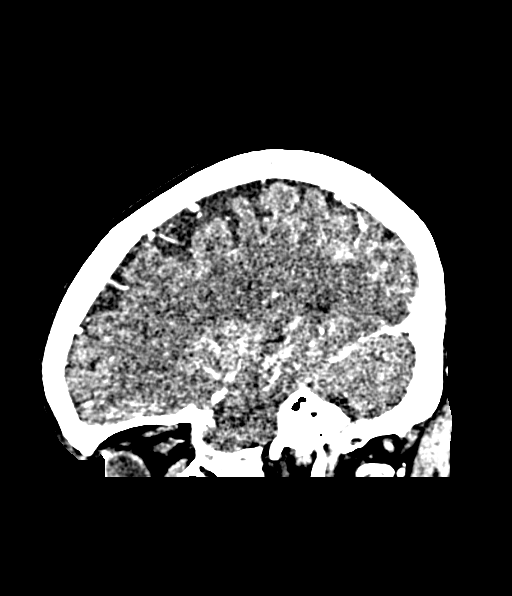

[12 of 47 positions shown; findings below may reference images not displayed]

FINDINGS: CT HEAD

Brain: Cerebral volume within normal limits for patient age.

No evidence for acute intracranial hemorrhage. No findings to
suggest acute large vessel territory infarct. No mass lesion,
midline shift, or mass effect. Ventricles are normal in size without
evidence for hydrocephalus. No extra-axial fluid collection
identified.

Vascular: No hyperdense vessel identified.

Skull: Scalp soft tissues demonstrate no acute abnormality.Calvarium
intact.

Sinuses/Orbits: Globes and orbital soft tissues are within normal
limits.

Visualized paranasal sinuses are clear. No mastoid effusion.

CTA HEAD

Anterior circulation: Distal cervical segments of the internal
carotid artery's widely patent. Petrous, cavernous, and supraclinoid
right ICA widely patent without flow-limiting stenosis. Petrous left
ICA widely patent. Mild scattered plaque within the cavernous and
supraclinoid left ICA without stenosis. ICA termini widely patent.
A1 segments patent bilaterally. Anterior communicating artery
normal. Anterior cerebral arteries widely patent to their distal
aspects without stenosis.

M1 segments widely patent without stenosis or occlusion. MCA
bifurcations normal. No proximal M2 occlusion. Distal MCA branches
well opacified and symmetric.

Posterior circulation: Vertebral artery's widely patent in the
vertebrobasilar junction without stenosis. Posterior inferior
cerebral arteries patent bilaterally. Basilar artery widely patent
to its distal aspect. Superior cerebral arteries patent bilaterally.
Both of the posterior cerebral artery is arise from the basilar
artery and are widely patent to their distal aspects without
stenosis. Small bilateral posterior communicating arteries noted.

Venous sinuses: Patent.

Anatomic variants: No significant anatomic variant. No aneurysm or
vascular malformation.

Delayed phase: No abnormal enhancement.
IMPRESSION: Normal CTA of the head and neck.

## 2018-04-09 IMAGING — MR MR HEAD W/O CM
8 of 10 series · 41 of 48 positions shown · non-contrast
Comparison: Prior CT from earlier the same day as well as previous
MRI from 11/23/2016.

CLINICAL DATA: Initial evaluation for acute left-sided weakness for
2 days.

EXAM:
MRI HEAD WITHOUT CONTRAST
TECHNIQUE: Multiplanar, multiecho pulse sequences of the brain and surrounding
structures were obtained without intravenous contrast.

[Series 2: DWI · axial · 3.0mm · 0.68mm/px · z∈[-67,+91]mm · 8 of 54 slices shown (1 of 4)]
[im 1/54]
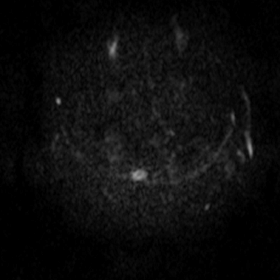
[im 8/54]
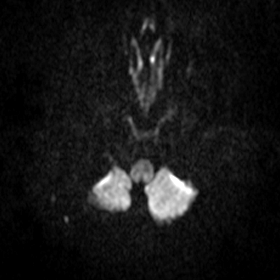
[im 16/54]
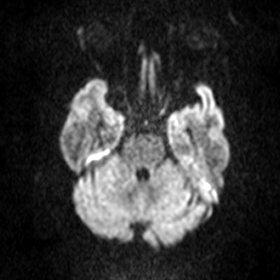
[im 23/54]
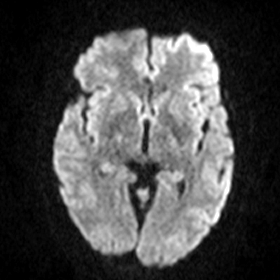
[im 31/54]
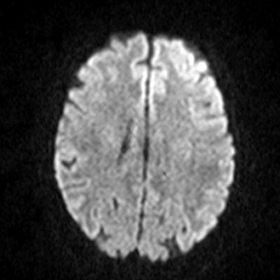
[im 38/54]
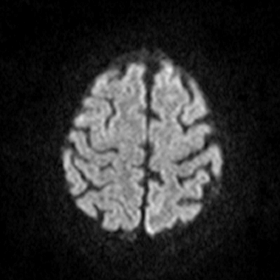
[im 46/54]
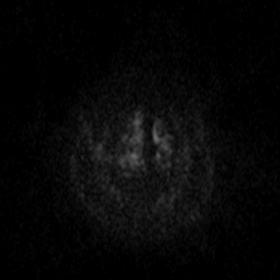
[im 54/54]
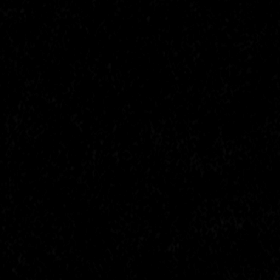

[Series 3: DWI · axial · 3.0mm · 0.73mm/px · z∈[-70,+82]mm · 6 of 52 slices shown (2 of 4)]
[im 1/52]
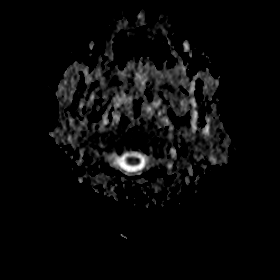
[im 11/52]
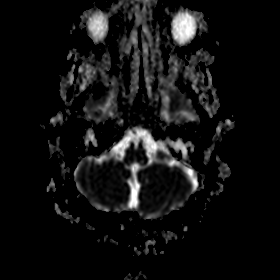
[im 21/52]
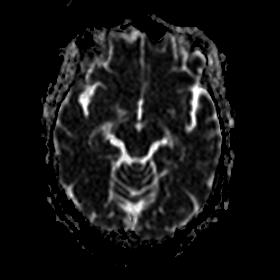
[im 31/52]
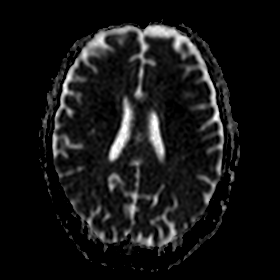
[im 41/52]
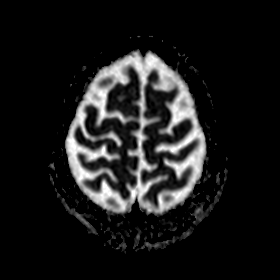
[im 52/52]
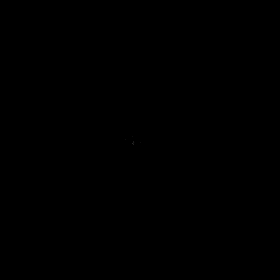

[Series 4: DWI · coronal · 5.0mm · 0.46mm/px · 4 of 34 slices shown (3 of 4)]
[im 1/34]
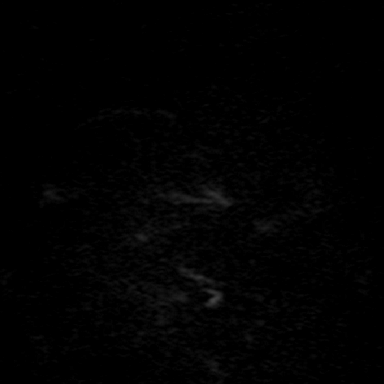
[im 12/34]
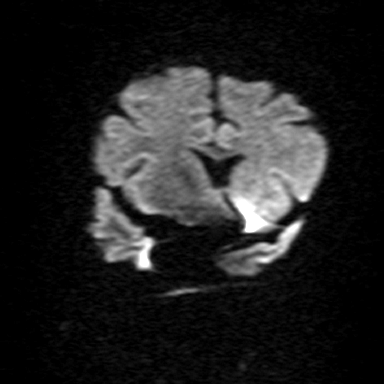
[im 23/34]
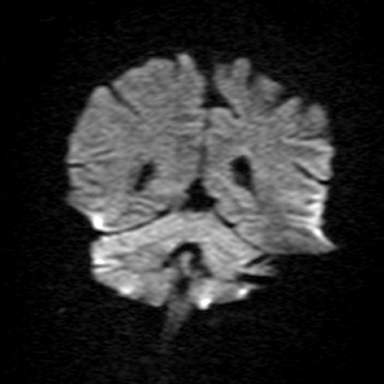
[im 34/34]
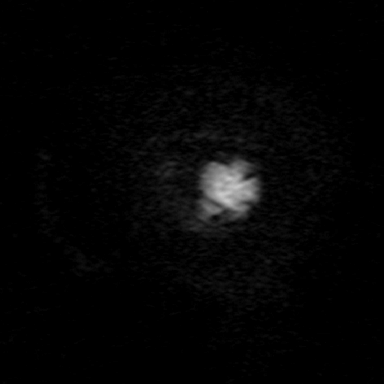

[Series 5: DWI · coronal · 5.0mm · 0.47mm/px · 4 of 34 slices shown (4 of 4)]
[im 1/34]
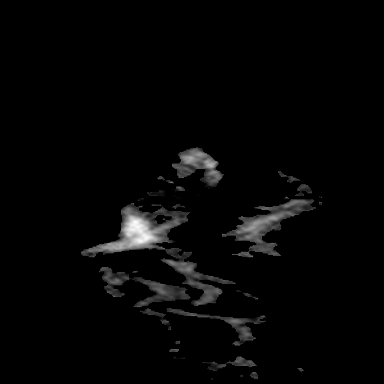
[im 12/34]
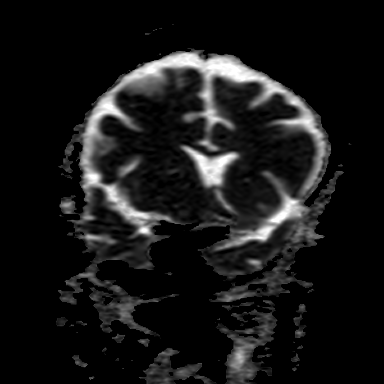
[im 23/34]
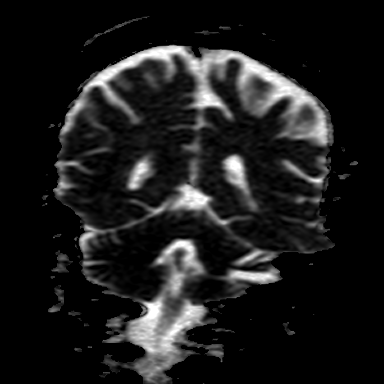
[im 34/34]
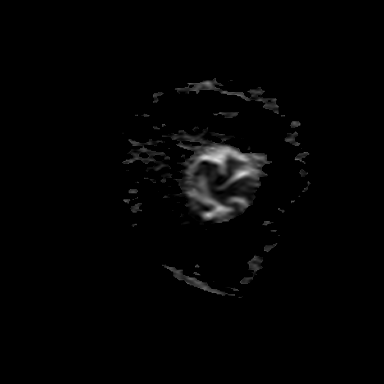

[Series 7: T2 · axial · 5.0mm · 0.65mm/px · z∈[-61,+81]mm · 3 of 23 slices shown (1 of 2)]
[im 1/23]
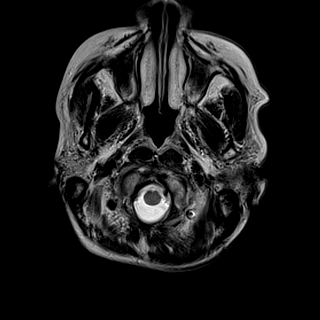
[im 12/23]
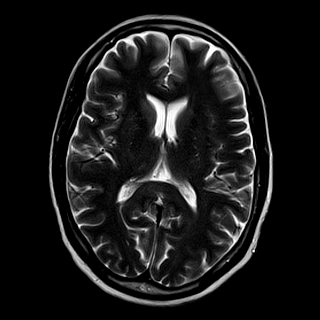
[im 23/23]
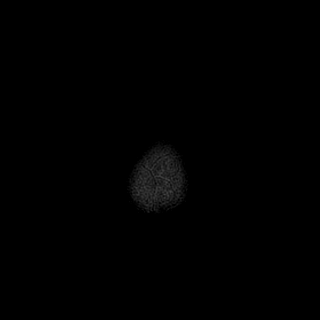

[Series 8: FLAIR · axial · 3.0mm · 0.76mm/px · z∈[-58,+80]mm · 6 of 47 slices shown]
[im 1/47]
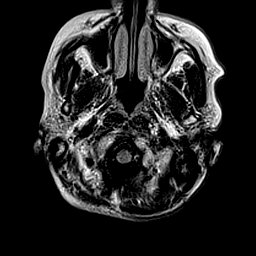
[im 10/47]
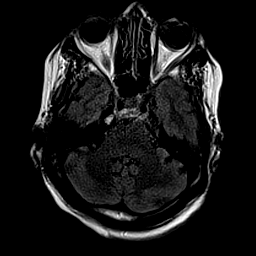
[im 19/47]
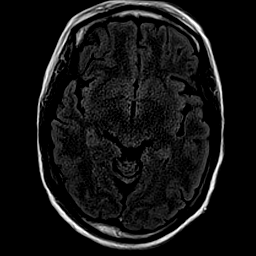
[im 28/47]
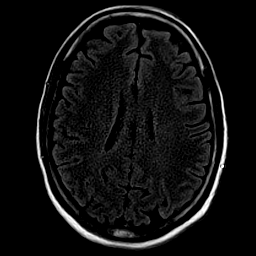
[im 37/47]
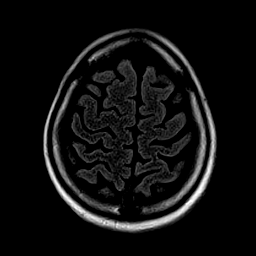
[im 47/47]
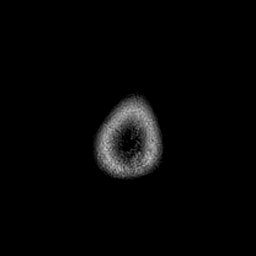

[Series 9: T1 · axial · 2.0mm · 0.41mm/px · z∈[-65,+61]mm · 7 of 73 slices shown]
[im 1/73]
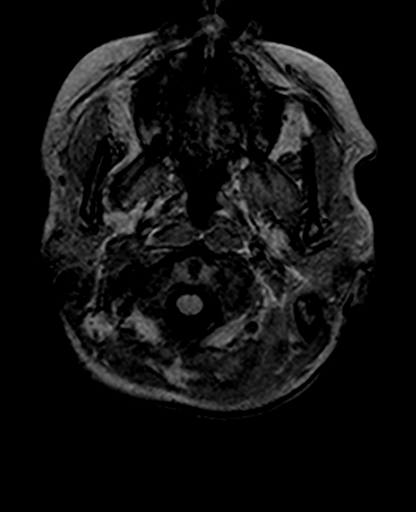
[im 10/73]
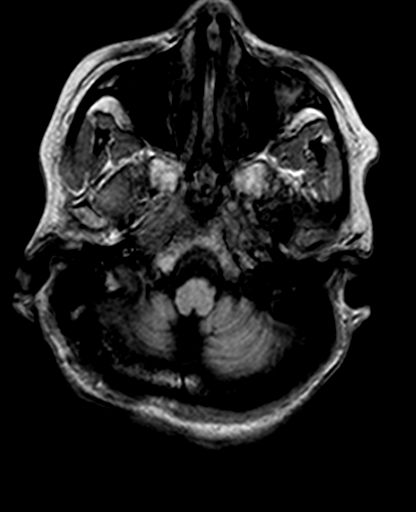
[im 19/73]
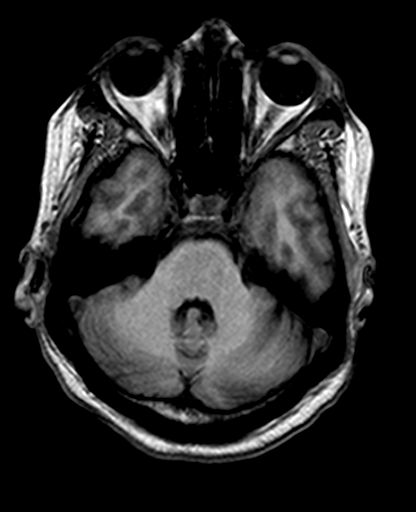
[im 28/73]
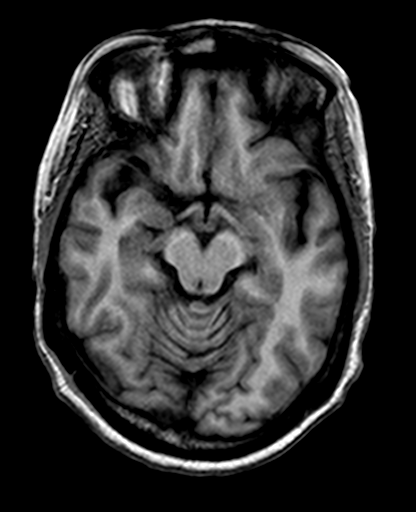
[im 46/73]
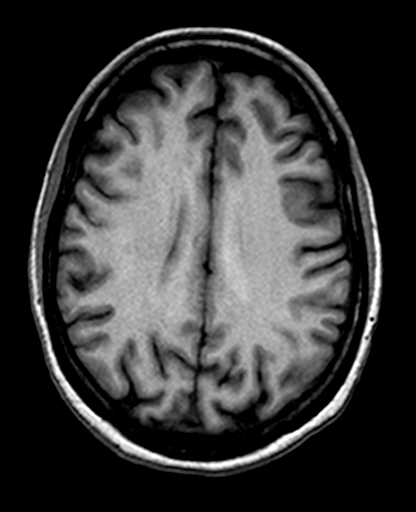
[im 55/73]
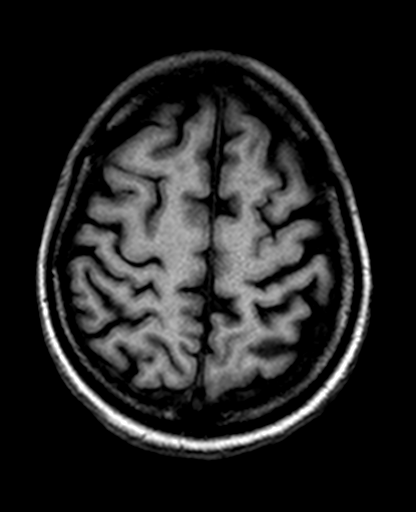
[im 64/73]
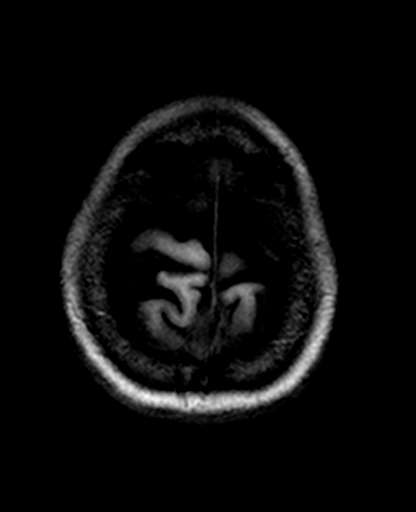

[Series 11: T2 · coronal · 5.0mm · 0.60mm/px · 3 of 28 slices shown (2 of 2)]
[im 1/28]
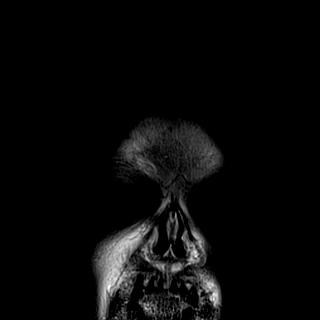
[im 14/28]
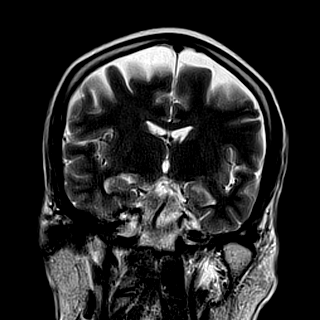
[im 28/28]
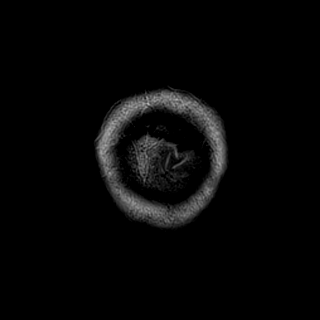

[41 of 48 positions shown; findings below may reference images not displayed]

FINDINGS: Brain: Cerebral volume within normal limits. No focal parenchymal
signal abnormality. No abnormal foci of restricted diffusion to
suggest acute or subacute ischemia. Gray-white matter
differentiation well maintained. No encephalomalacia to suggest
chronic infarction. No susceptibility fact to suggest acute or
chronic intracranial hemorrhage.

No mass lesion, midline shift or mass effect. No hydrocephalus. No
extra-axial fluid collection. Major dural sinuses are patent.

Incidental note made of a partially empty sella. Midline structures
intact and normal.

Vascular: Major intracranial vascular flow voids are well
maintained.

Skull and upper cervical spine: Craniocervical junction normal.
Visualized upper cervical spine unremarkable. Bone marrow signal
intensity within normal limits. No scalp soft tissue abnormality.

Sinuses/Orbits: Globes and orbital soft tissues within normal
limits. Visualized paranasal sinuses are clear. No significant
mastoid effusion. Inner ear structures normal.

Other: None.
IMPRESSION: Normal MRI of the brain. No acute intracranial abnormality
identified.

## 2018-04-09 IMAGING — CT CT HEAD W/O CM
3 series · 15 of 47 positions shown, 18 images · non-contrast
Comparison: November 26, 2016 head CT; brain MRI November 26, 2016

CLINICAL DATA: Left-sided weakness

EXAM:
CT HEAD WITHOUT CONTRAST
TECHNIQUE: Contiguous axial images were obtained from the base of the skull
through the vertex without intravenous contrast.

[Series 2: head wo · axial · 0.41mm/px · z∈[+1622,+1752]mm · 9 of 32 slices shown, 12 images]
[im 3/32  brain]
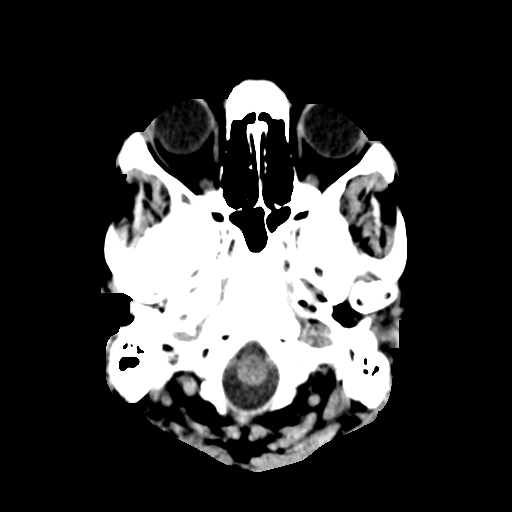
[im 3/32  bone]
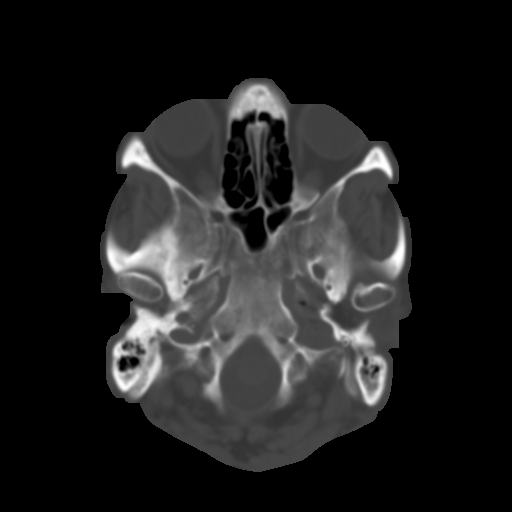
[im 6/32  brain]
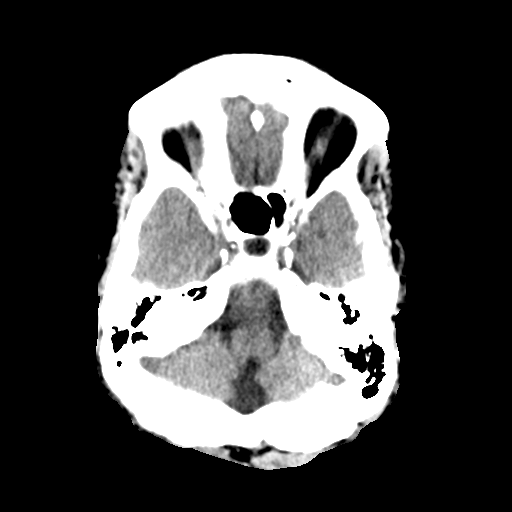
[im 9/32  brain]
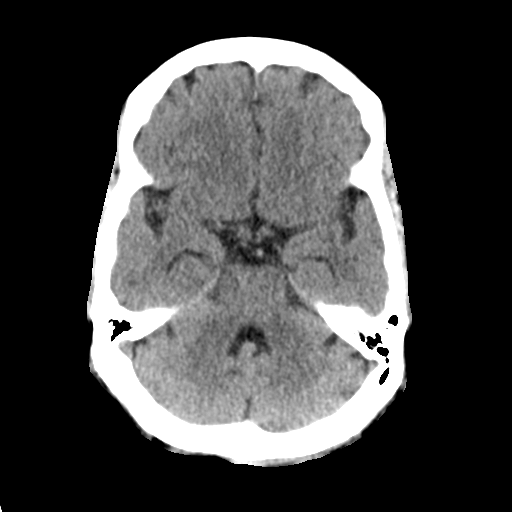
[im 12/32  brain]
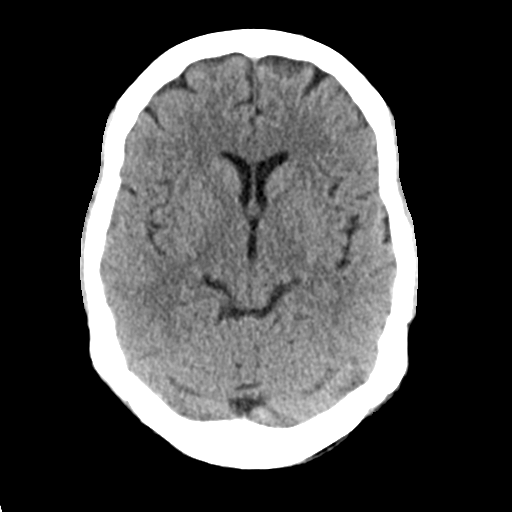
[im 17/32  brain]
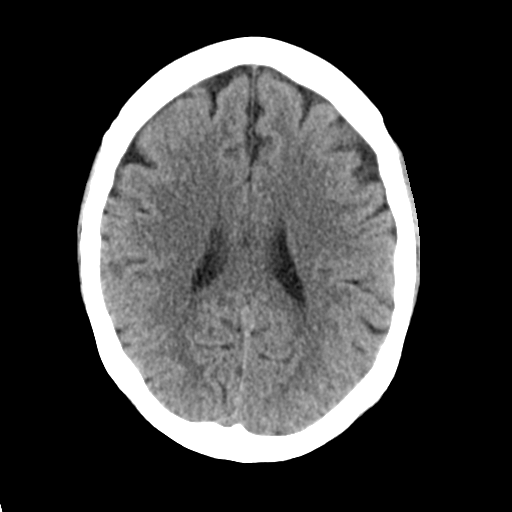
[im 17/32  bone]
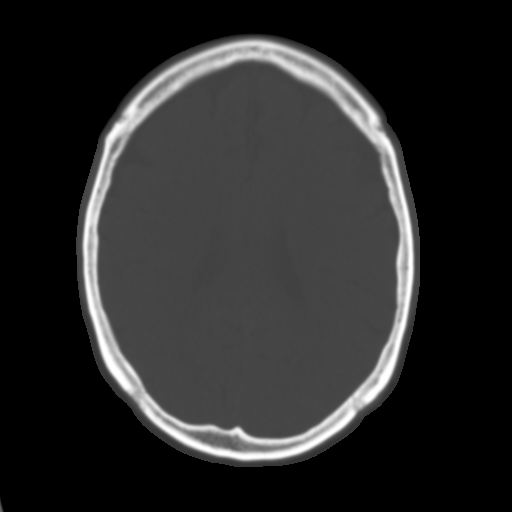
[im 20/32  brain]
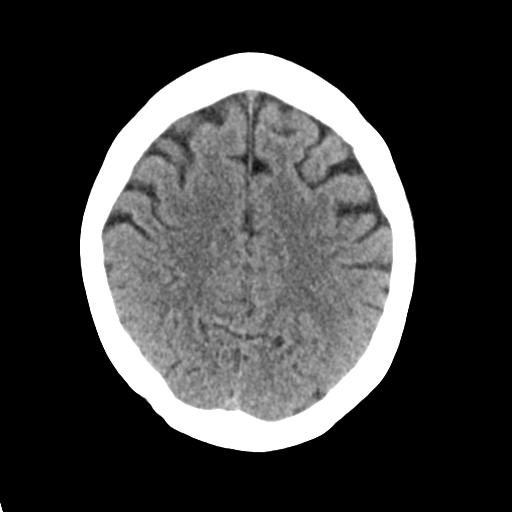
[im 23/32  brain]
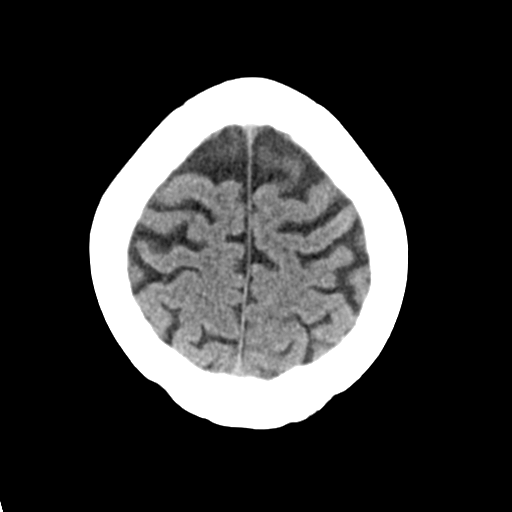
[im 26/32  brain]
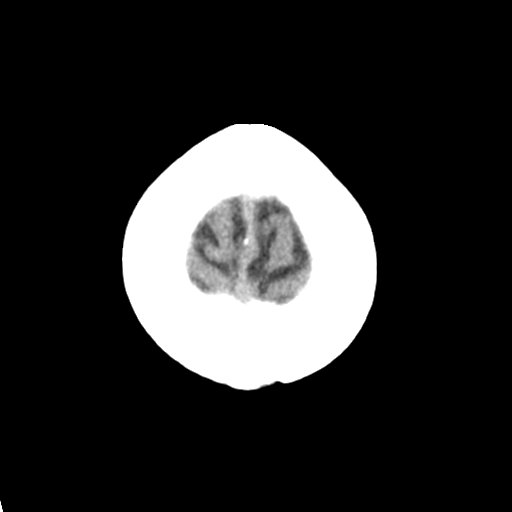
[im 29/32  brain]
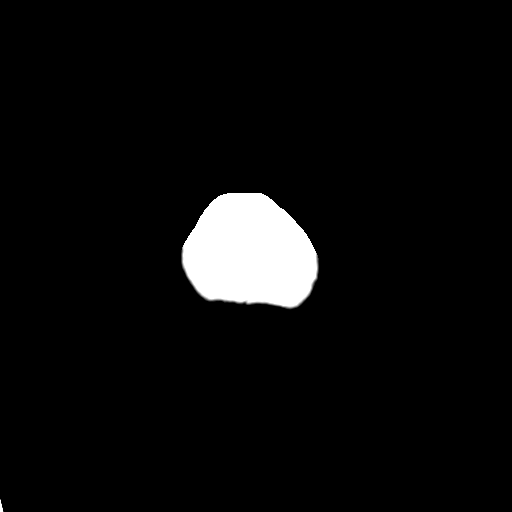
[im 29/32  bone]
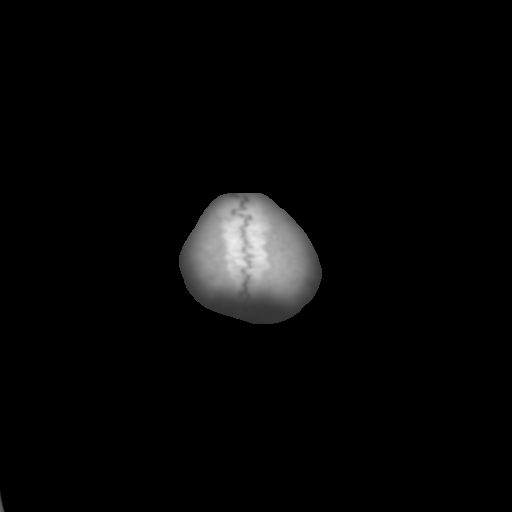

[Series 4: coronal soft tissue · coronal · 0.32mm/px · 3 of 66 slices shown]
[im 22/66  brain]
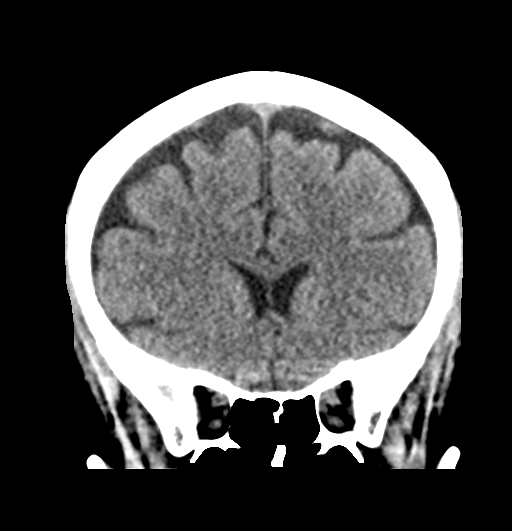
[im 29/66  brain]
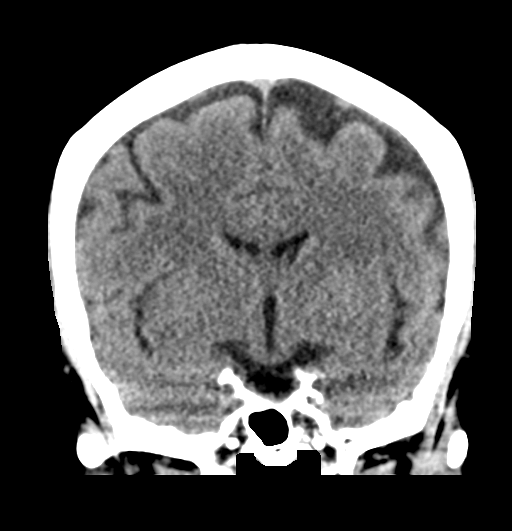
[im 37/66  brain]
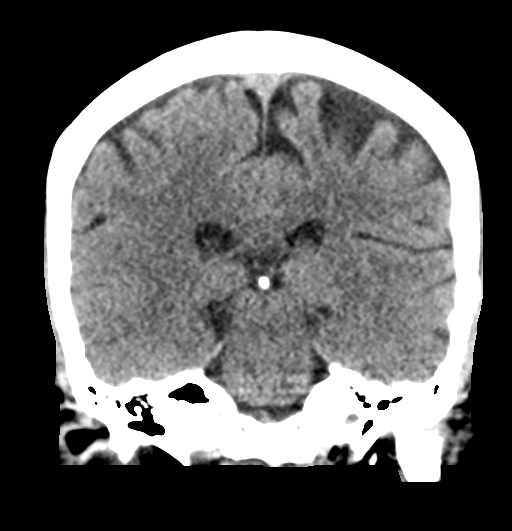

[Series 5: sagittal soft tissue · sagittal · 0.31mm/px · 3 of 50 slices shown]
[im 17/50  brain]
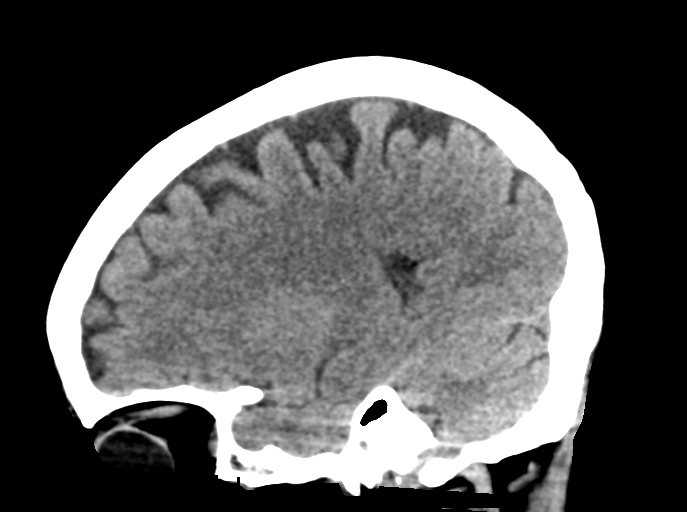
[im 25/50  brain]
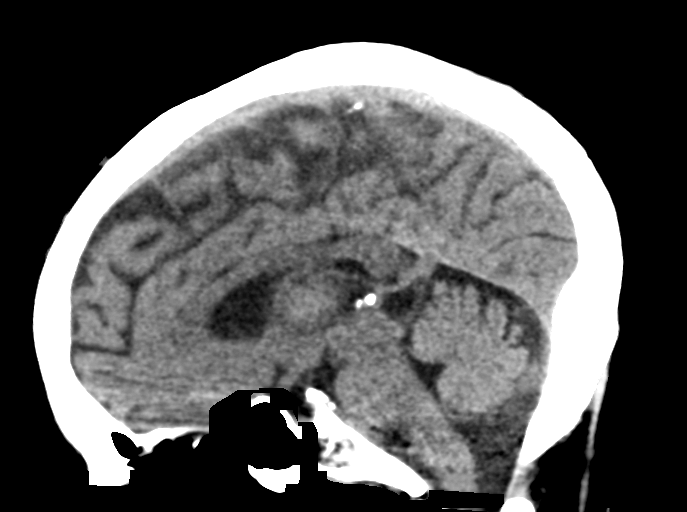
[im 33/50  brain]
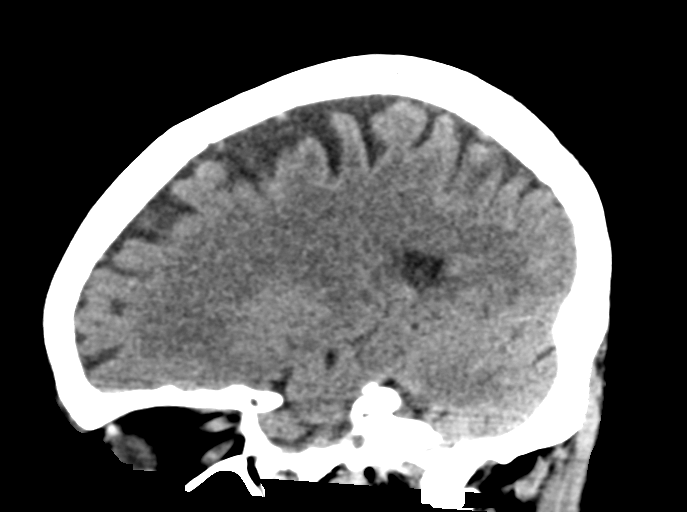

[15 of 47 positions shown; findings below may reference images not displayed]

FINDINGS: Brain: The ventricles are normal in size and configuration. Mild
prominence of the cisterna magna is a stable anatomic variant. There
is no intracranial mass, hemorrhage, extra-axial fluid collection,
or midline shift. No focal gray-white compartment lesions are
demonstrated on this study. No acute infarct is demonstrable.

Vascular: There is no demonstrable hyperdense vessel. There is
calcification in both carotid siphon regions.

Skull: The bony calvarium appears intact.

Sinuses/Orbits: There is mild mucosal thickening in several ethmoid
air cells bilaterally. Frontal sinuses are hypoplastic. Visualized
paranasal sinuses elsewhere clear. Visualized orbits appear
symmetric bilaterally.

Other: Mastoid air cells are clear.
IMPRESSION: No evident intracranial mass, hemorrhage, or extra-axial fluid
collection. No focal gray-white compartment lesion is demonstrated
on this study. Given patient's clinical symptoms, correlation with
MR with diffusion imaging may well be advisable.

There are foci of arterial vascular calcification. There is ethmoid
sinus mucosal thickening at several levels.

## 2018-04-11 IMAGING — MR MR HEAD WO/W CM
11 of 20 series · 26 of 48 positions shown · IV contrast (multihance)
Comparison: Prior CT and MRI from 12/02/2016.

CLINICAL DATA: Initial evaluation for acute ataxia for 2 days.
Evaluate for stroke.

EXAM:
MRI HEAD WITHOUT AND WITH CONTRAST
MRI CERVICAL SPINE WITHOUT AND WITH CONTRAST
TECHNIQUE: Multiplanar, multiecho pulse sequences of the brain and surrounding
structures, and cervical spine, to include the craniocervical
junction and cervicothoracic junction, were obtained without and
with intravenous contrast.
CONTRAST:  12mL MULTIHANCE GADOBENATE DIMEGLUMINE 529 MG/ML IV SOLN

[Series 3: DWI · axial · 3.0mm · 0.75mm/px · z∈[-77,+81]mm · 5 of 55 slices shown (1 of 4)]
[im 1/55]
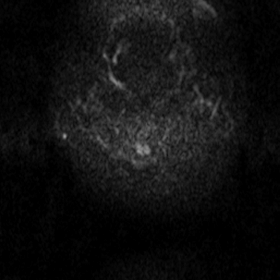
[im 14/55]
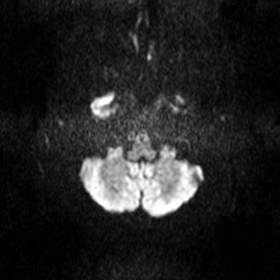
[im 28/55]
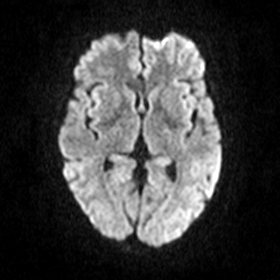
[im 41/55]
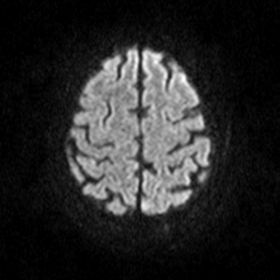
[im 55/55]
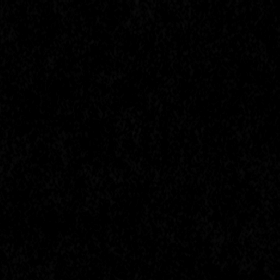

[Series 4: DWI · axial · 3.0mm · 0.75mm/px · z∈[-77,+79]mm · 5 of 54 slices shown (2 of 4)]
[im 1/54]
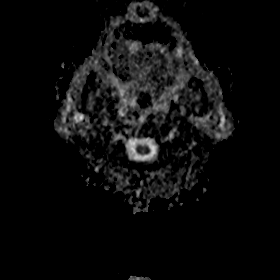
[im 14/54]
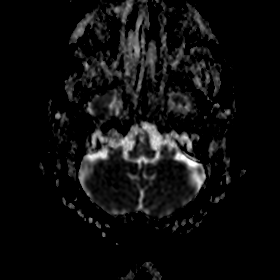
[im 27/54]
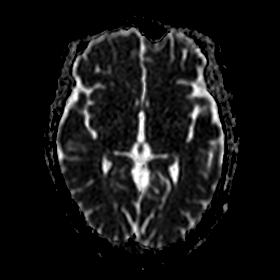
[im 40/54]
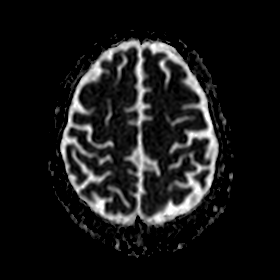
[im 54/54]
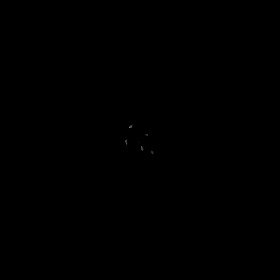

[Series 5: DWI · coronal · 5.0mm · 0.49mm/px · 2 of 36 slices shown (3 of 4)]
[im 1/36]
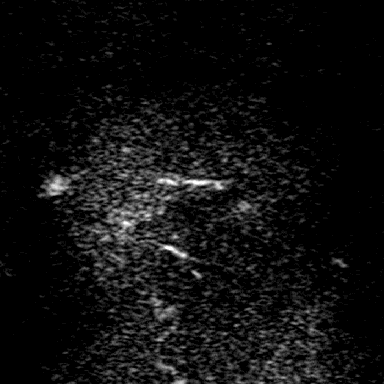
[im 36/36]
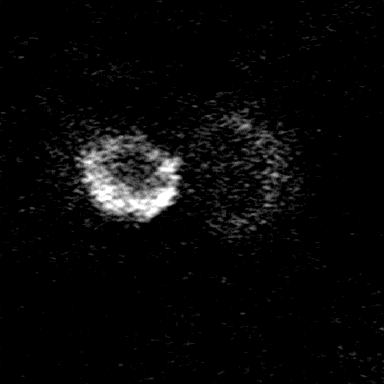

[Series 6: DWI · coronal · 5.0mm · 0.49mm/px · 2 of 36 slices shown (4 of 4)]
[im 1/36]
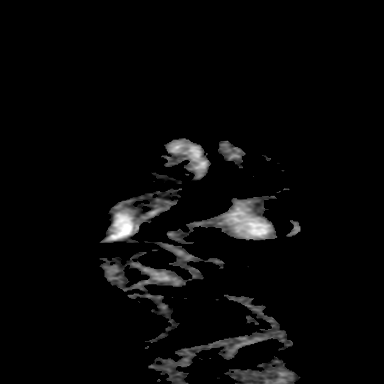
[im 36/36]
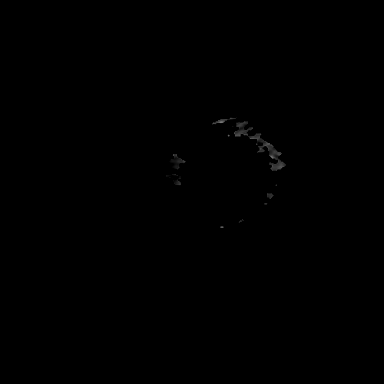

[Series 8: T2 · axial · 5.0mm · 0.50mm/px · z∈[-71,+69]mm · 2 of 23 slices shown (1 of 3)]
[im 1/23]
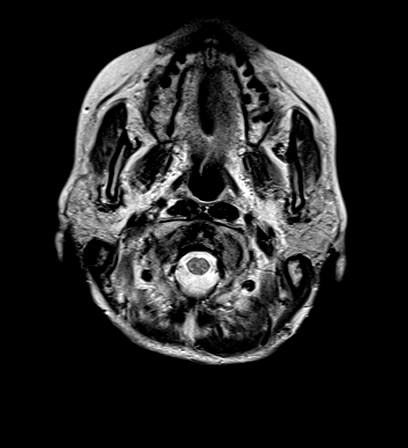
[im 23/23]
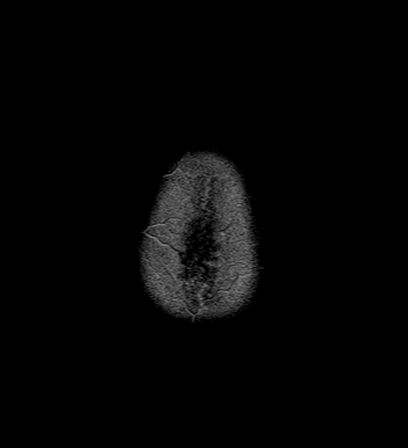

[Series 9: FLAIR · axial · 3.0mm · 0.33mm/px · z∈[-67,+68]mm · 3 of 47 slices shown (1 of 2)]
[im 1/47]
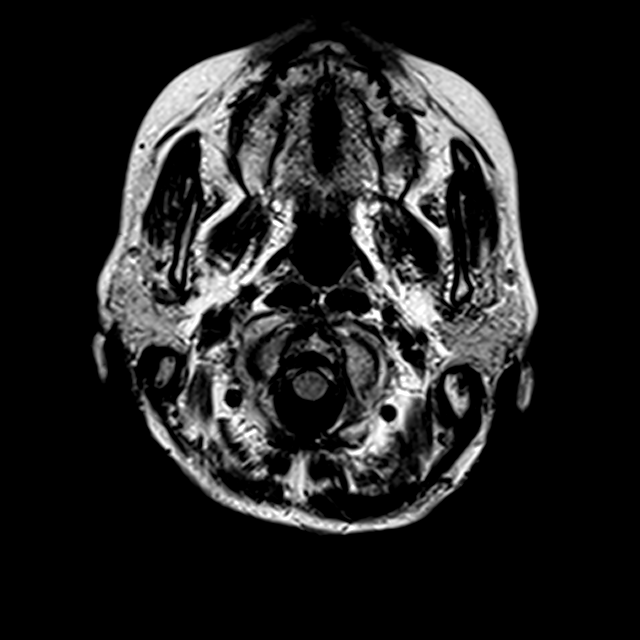
[im 24/47]
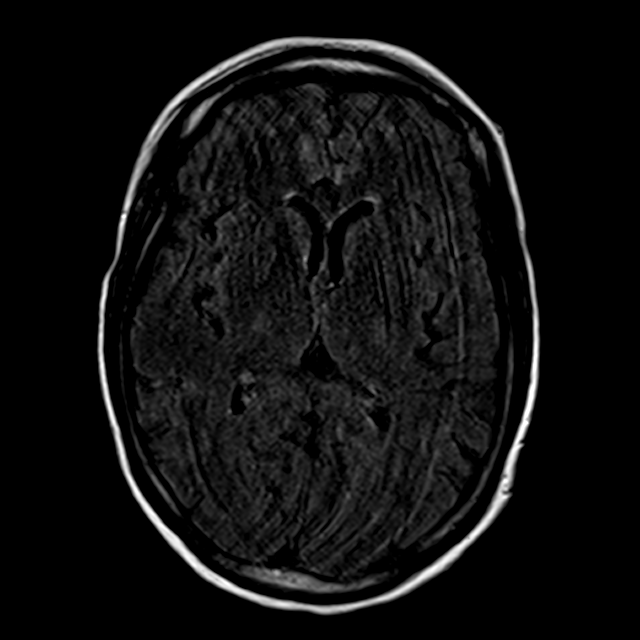
[im 47/47]
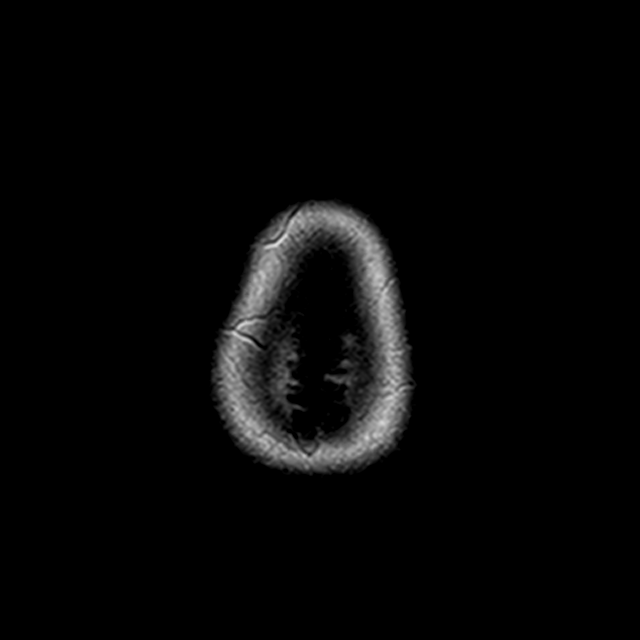

[Series 12: T2 · coronal · 5.0mm · 0.47mm/px · 2 of 28 slices shown (2 of 3)]
[im 1/28]
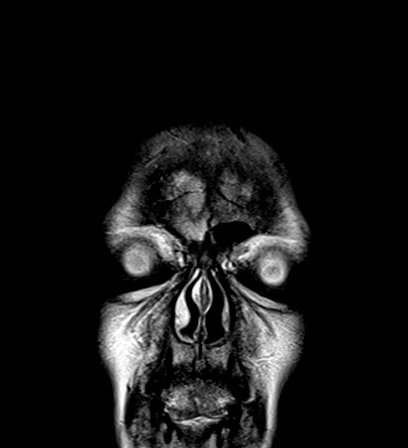
[im 28/28]
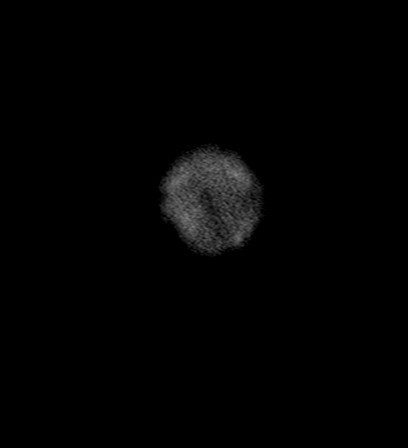

[Series 14: FLAIR · sagittal · 3.0mm · 0.43mm/px · 1 of 13 slices shown (2 of 2)]
[im 1/13]
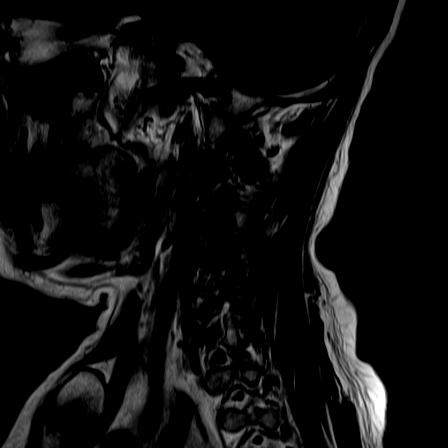

[Series 17: T2 · axial · 3.0mm · 0.22mm/px · 1 of 36 slices shown (3 of 3)]
[im 1/36]
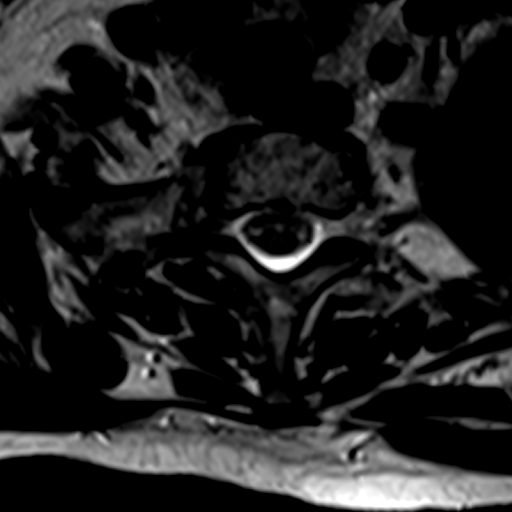

[Series 20: FLAIR post-contrast · sagittal · 3.0mm · 0.45mm/px · 1 of 13 slices shown]
[im 1/13]
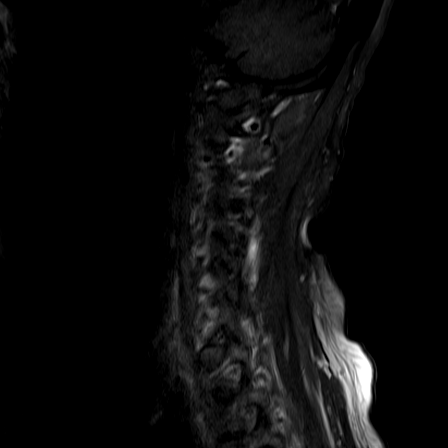

[Series 24: T1 post-contrast · coronal · 5.0mm · 0.39mm/px · 2 of 28 slices shown]
[im 1/28]
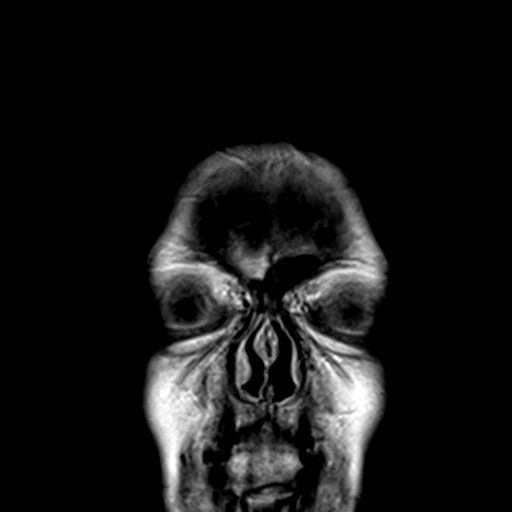
[im 28/28]
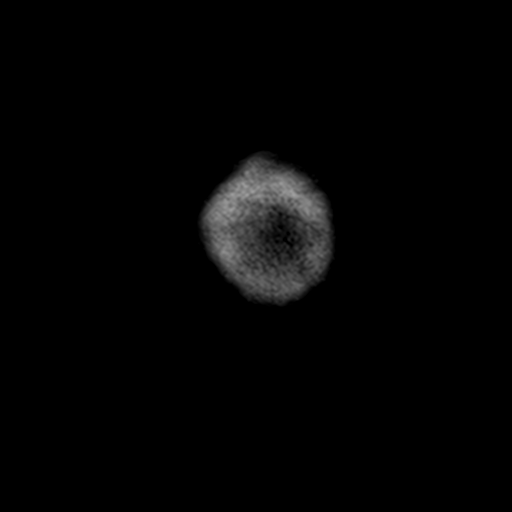

[26 of 48 positions shown; findings below may reference images not displayed]

FINDINGS: MRI HEAD FINDINGS

Brain: Cerebral volume within normal limits for age. Few tiny
subcentimeter T2/FLAIR hyperintensities noted within the
periventricular deep white matter, nonspecific, within normal limits
for age. No abnormal foci of restricted diffusion to suggest acute
or subacute ischemia. Gray-white matter differentiation maintained.
No encephalomalacia to suggest chronic infarction. No susceptibility
artifact to suggest acute or chronic intracranial hemorrhage.

No mass lesion, midline shift or mass effect. No hydrocephalus. No
extra-axial fluid collection. Major dural sinuses are patent. No
abnormal enhancement.

Incidental note made of an empty sella. Midline structures intact
and normal.

Vascular: Major intracranial vascular flow voids maintained.

Skull and upper cervical spine: Craniocervical junction normal. Bone
marrow signal intensity normal. No scalp soft tissue abnormality.

Sinuses/Orbits: Globes and orbital soft tissues within normal
limits. Paranasal sinuses are clear. Trace opacity bilateral mastoid
air cells. Inner ear structures normal.

Other: None.

MRI CERVICAL SPINE FINDINGS

Alignment: Study degraded by motion artifact.

Straightening with slight reversal of the normal cervical lordosis,
apex at C5-6. No listhesis.

Vertebrae: Vertebral body heights maintained. No evidence for acute
or chronic fracture. Bone marrow signal intensity within normal
limits. No discrete or worrisome osseous lesions. No abnormal
enhancement.

Cord: Signal intensity within the cervical spinal cord is within
normal limits.

Posterior Fossa, vertebral arteries, paraspinal tissues:
Craniocervical junction within normal limits. Paraspinous and
prevertebral soft tissues within normal limits. Normal intravascular
flow voids present within the vertebral arteries bilaterally.

Disc levels:

C2-C3: Shallow central disc protrusion minimally indents the ventral
thecal sac. No significant canal or foraminal stenosis.

C3-C4:  Minimal disc bulge.  No canal or foraminal stenosis.

C4-C5:  Mild diffuse disc bulge.  No significant stenosis.

C5-C6: Chronic diffuse degenerative disc osteophyte with
intervertebral disc space narrowing. Mild bilateral uncovertebral
hypertrophy. Broad posterior component indents and partially faces
the ventral thecal sac with resultant mild canal stenosis. Mild left
C6 foraminal stenosis. No significant right foraminal narrowing.

C6-C7: Central/left paracentral disc protrusion indents the ventral
thecal sac. No significant cord deformity or stenosis. Foramina are
widely patent.

C7-T1:  Unremarkable.

Visualized upper thoracic spine within normal limits.
IMPRESSION: MRI HEAD IMPRESSION:

1. Stable essentially normal MRI appearance of the brain. No acute
intracranial infarct or other abnormality.
2. Empty sella.

MRI CERVICAL SPINE IMPRESSION:

1. No acute abnormality within the cervical spine. Normal MRI
appearance of the cervical spinal cord.
2. Degenerative disc osteophyte at C5-6 with resultant mild canal
and left C6 foraminal stenosis.
3. Central disc protrusion at C6-7 without significant stenosis.

## 2018-04-19 DIAGNOSIS — J9811 Atelectasis: Secondary | ICD-10-CM | POA: Diagnosis not present

## 2018-04-19 DIAGNOSIS — J181 Lobar pneumonia, unspecified organism: Secondary | ICD-10-CM | POA: Diagnosis not present

## 2018-04-19 DIAGNOSIS — R112 Nausea with vomiting, unspecified: Secondary | ICD-10-CM | POA: Diagnosis not present

## 2018-04-19 DIAGNOSIS — J189 Pneumonia, unspecified organism: Secondary | ICD-10-CM | POA: Diagnosis not present

## 2018-04-19 DIAGNOSIS — Z886 Allergy status to analgesic agent status: Secondary | ICD-10-CM | POA: Diagnosis not present

## 2018-04-19 DIAGNOSIS — I10 Essential (primary) hypertension: Secondary | ICD-10-CM | POA: Diagnosis not present

## 2018-04-27 IMAGING — CT CT ANGIO HEAD
2 of 11 series · 6 of 34 positions shown · IV contrast (Isovue)
Comparison: MRI 12/04/2016

CLINICAL DATA: Left-sided weakness.  Follow-up stroke.

EXAM:
CT ANGIOGRAPHY HEAD AND NECK
TECHNIQUE: Multidetector CT imaging of the head and neck was performed using
the standard protocol during bolus administration of intravenous
contrast. Multiplanar CT image reconstructions and MIPs were
obtained to evaluate the vascular anatomy. Carotid stenosis
measurements (when applicable) are obtained utilizing NASCET
criteria, using the distal internal carotid diameter as the
denominator.
CONTRAST:  75 cc Isovue 370

[Series 7: sagittal soft tissue · sagittal · 0.28mm/px · 1 of 49 slices shown]
[im 6/49  soft-tissue]
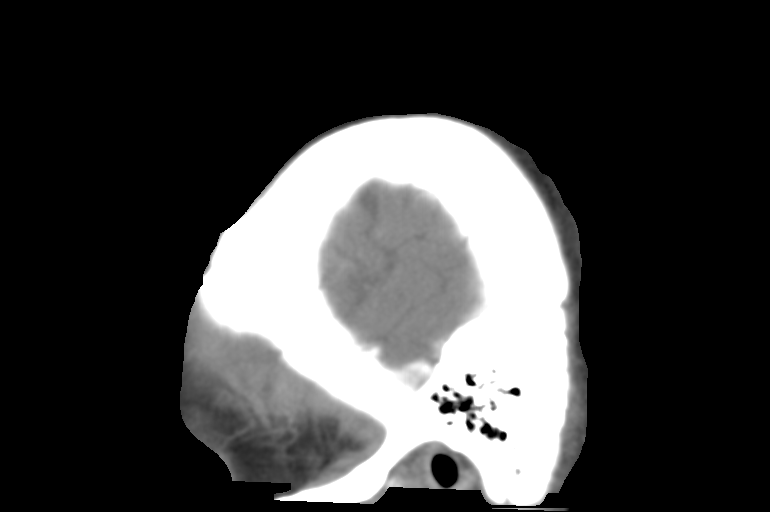

[Series 10: ax thin · axial · 0.39mm/px · z∈[+167,+389]mm · 5 of 334 slices shown]
[im 56/334  soft-tissue]
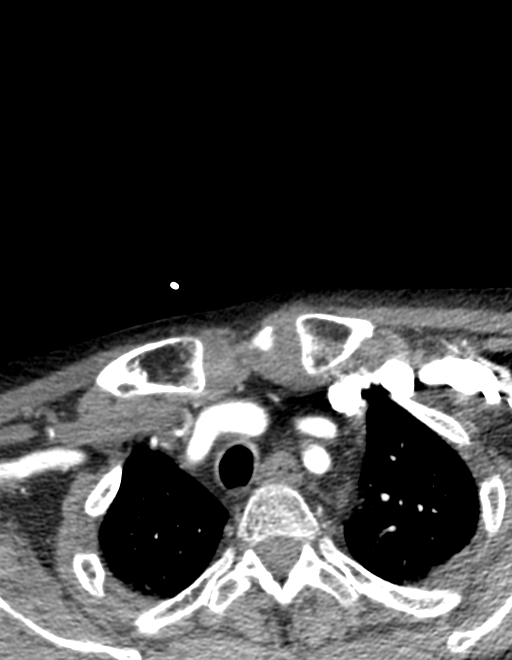
[im 112/334  bone]
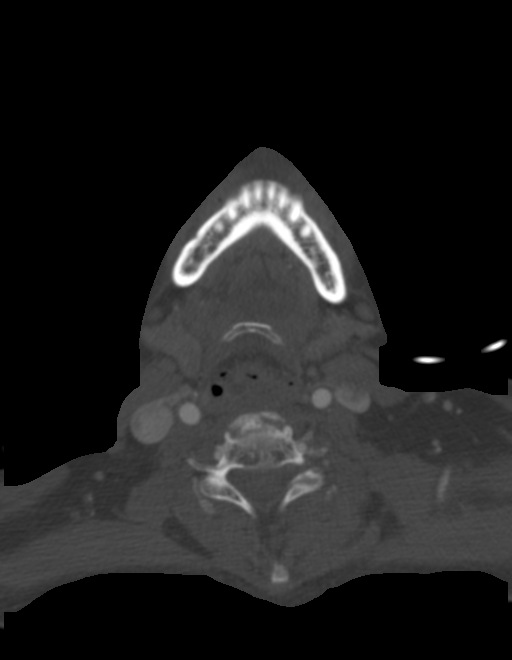
[im 167/334  soft-tissue]
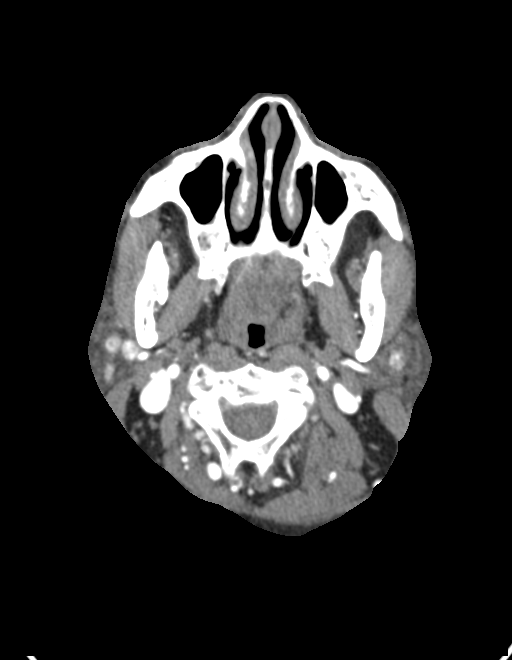
[im 223/334  bone]
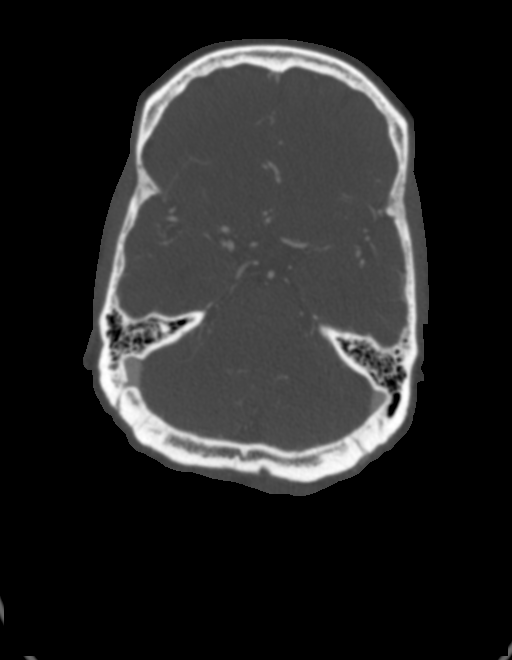
[im 278/334  soft-tissue]
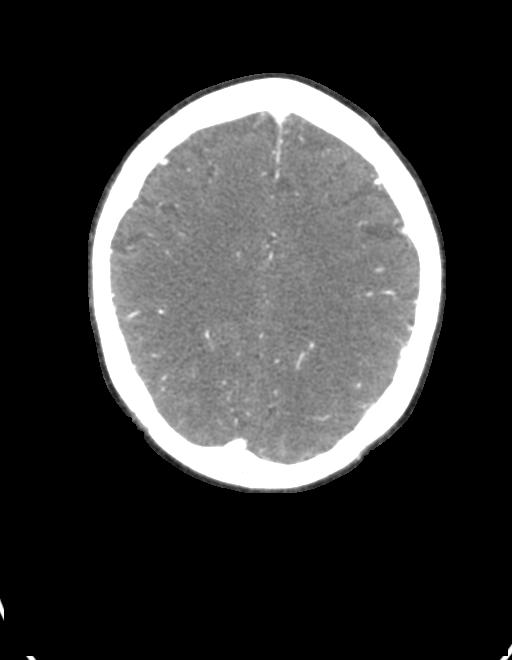

[6 of 34 positions shown; findings below may reference images not displayed]

FINDINGS: CT HEAD FINDINGS

Brain: No evidence of malformation, atrophy, old or acute small or
large vessel infarction, mass lesion, hemorrhage, hydrocephalus or
extra-axial collection. No evidence of pituitary lesion.

Vascular: No vascular calcification.  No hyperdense vessels.

Skull: Normal.  No fracture or focal bone lesion.

Sinuses/Orbits: Visualized sinuses are clear. No fluid in the middle
ears or mastoids. Visualized orbits are normal.

Other: None significant

CTA NECK FINDINGS

Aortic arch: Minimal atherosclerotic calcification at the arch. No
aneurysm or dissection. Branching pattern of the brachiocephalic
vessels is normal without stenosis.

Right carotid system: Common carotid artery widely patent to the
bifurcation. Carotid bifurcation is normal without atherosclerotic
plaque or irregularity. Cervical internal carotid artery widely
patent.

Left carotid system: Common carotid artery widely patent to the
bifurcation. Carotid bifurcation is normal without atherosclerotic
plaque or irregularity. Cervical internal carotid artery is widely
patent.

Vertebral arteries: Both vertebral artery origins are widely patent.
The vertebral arteries are approximately equal in size an widely
patent through the cervical region.

Skeleton: Spondylosis C5-6.

Other neck: No mass or lymphadenopathy.

Upper chest: No abnormality seen on the left. On the right, there is
volume loss in the upper lobe, similar to the chest radiograph of
10/21/2016. Follow-up to clearing suggested.

Review of the MIP images confirms the above findings

CTA HEAD FINDINGS

Anterior circulation: Both internal carotid arteries are patent
through the skullbase and siphon regions. The anterior and middle
cerebral vessels are patent without proximal stenosis, aneurysm or
vascular malformation.

Posterior circulation: Both vertebral arteries are widely patent to
the basilar. No basilar stenosis. Posterior circulation branch
vessels are normal.

Venous sinuses: Patent and normal.

Anatomic variants: None

Delayed phase: No abnormal enhancement

Review of the MIP images confirms the above findings
IMPRESSION: Minimal atherosclerosis of the aortic arch.

Normal appearance of the brachiocephalic and intracranial vessels.
No atherosclerotic plaque or regularity.

Normal appearance of the brain.

Focal density in the right upper lobe presumed to represent
persistent atelectasis. Follow-up to clearing suggested
subsequently.

## 2018-04-27 IMAGING — MR MR HEAD W/O CM
8 of 10 series · 41 of 48 positions shown · non-contrast
Comparison: 12/03/2016

CLINICAL DATA: Left lower extremity weakness for 2 months

EXAM:
MRI HEAD WITHOUT CONTRAST
TECHNIQUE: Multiplanar, multiecho pulse sequences of the brain and surrounding
structures were obtained without intravenous contrast.

[Series 2: DWI · axial · 3.0mm · 0.70mm/px · z∈[-119,+43]mm · 6 of 55 slices shown (1 of 4)]
[im 1/55]
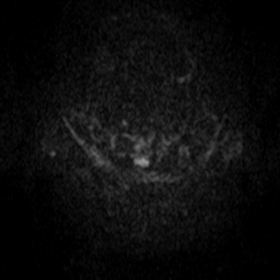
[im 11/55]
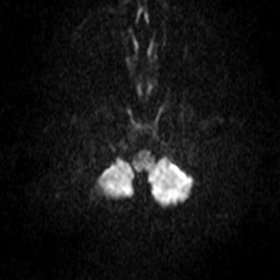
[im 22/55]
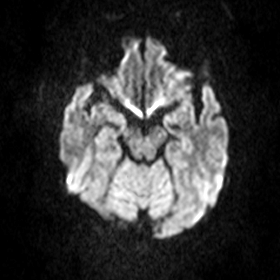
[im 33/55]
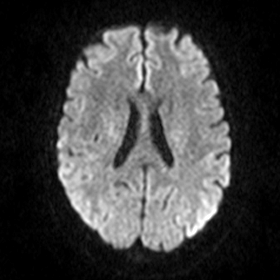
[im 44/55]
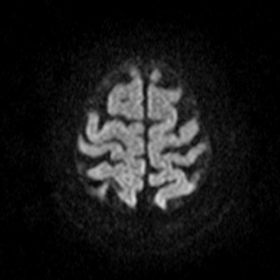
[im 55/55]
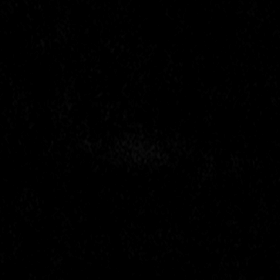

[Series 3: DWI · axial · 3.0mm · 0.74mm/px · z∈[-119,+40]mm · 7 of 54 slices shown (2 of 4)]
[im 1/54]
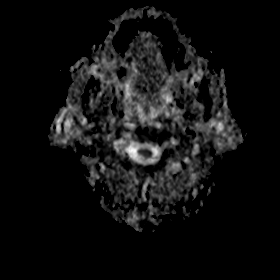
[im 9/54]
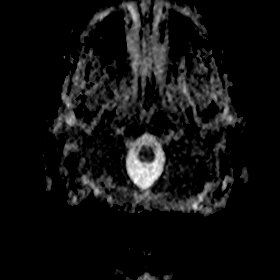
[im 18/54]
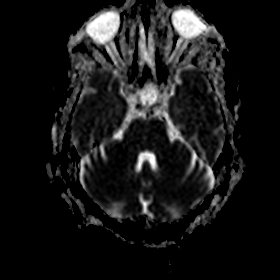
[im 27/54]
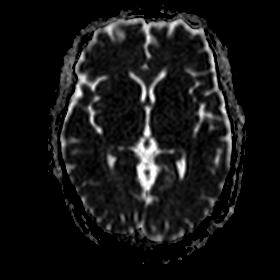
[im 36/54]
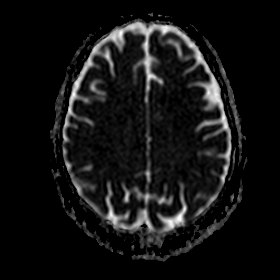
[im 45/54]
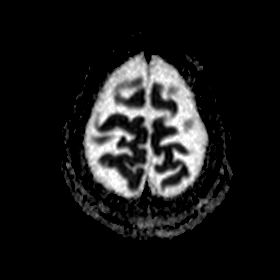
[im 54/54]
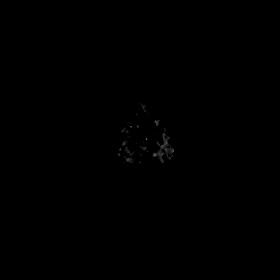

[Series 4: DWI · coronal · 5.0mm · 0.42mm/px · 4 of 34 slices shown (3 of 4)]
[im 1/34]
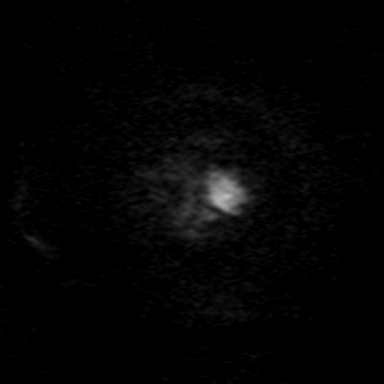
[im 12/34]
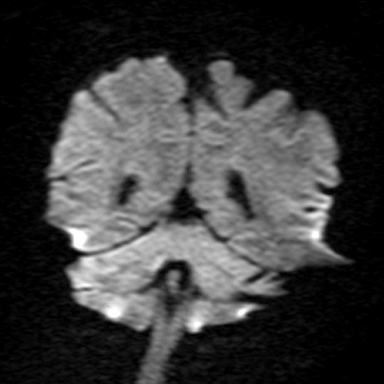
[im 23/34]
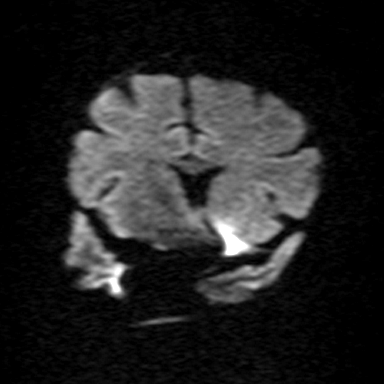
[im 34/34]
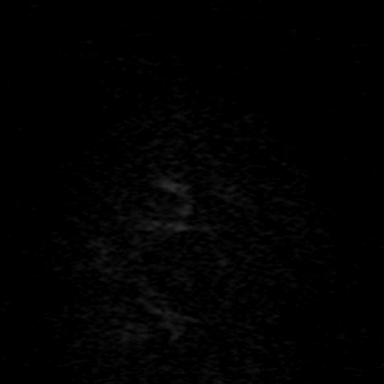

[Series 5: DWI · coronal · 5.0mm · 0.46mm/px · 4 of 34 slices shown (4 of 4)]
[im 1/34]
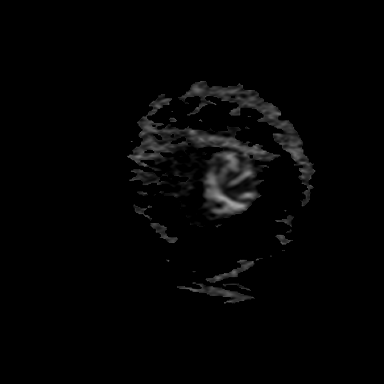
[im 12/34]
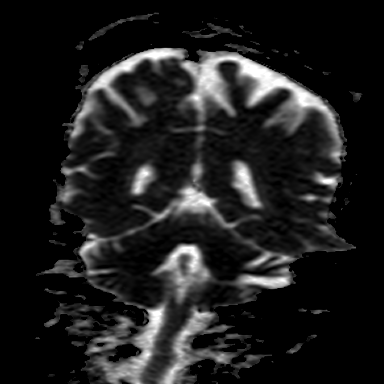
[im 23/34]
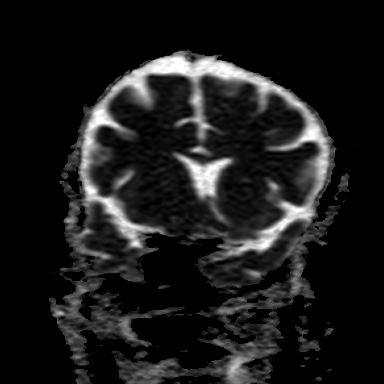
[im 34/34]
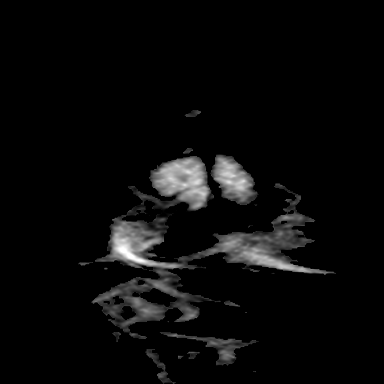

[Series 7: T2 · axial · 5.0mm · 0.63mm/px · z∈[-109,+34]mm · 3 of 23 slices shown (1 of 2)]
[im 1/23]
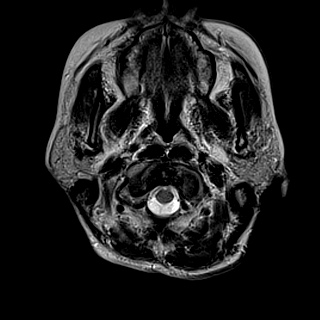
[im 12/23]
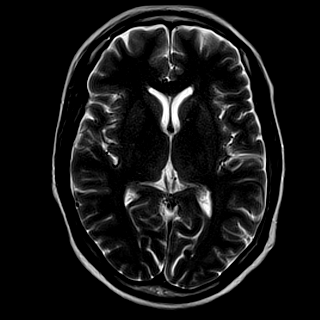
[im 23/23]
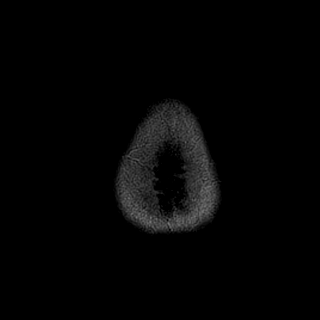

[Series 8: FLAIR · axial · 3.0mm · 0.79mm/px · z∈[-107,+31]mm · 6 of 47 slices shown]
[im 1/47]
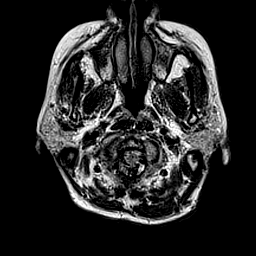
[im 10/47]
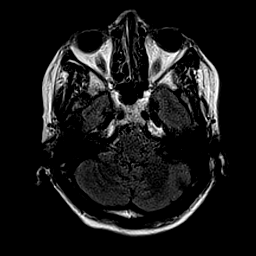
[im 19/47]
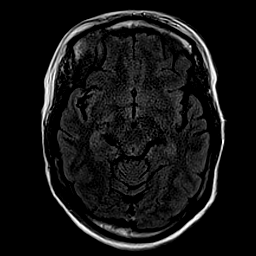
[im 28/47]
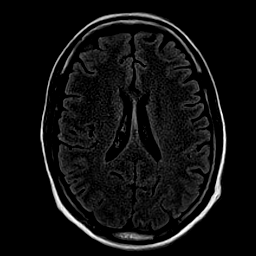
[im 37/47]
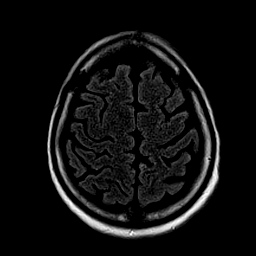
[im 47/47]
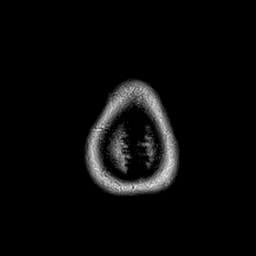

[Series 9: T1 · axial · 2.0mm · 0.42mm/px · z∈[-115,+42]mm · 8 of 80 slices shown]
[im 1/80]
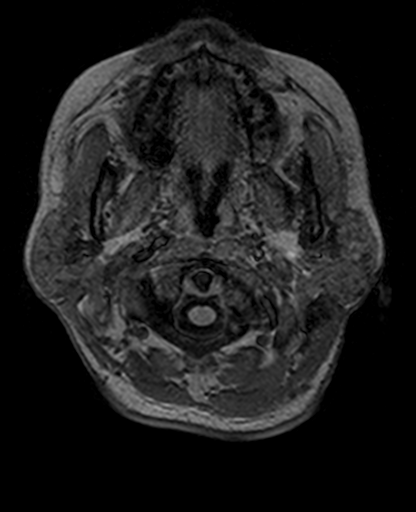
[im 9/80]
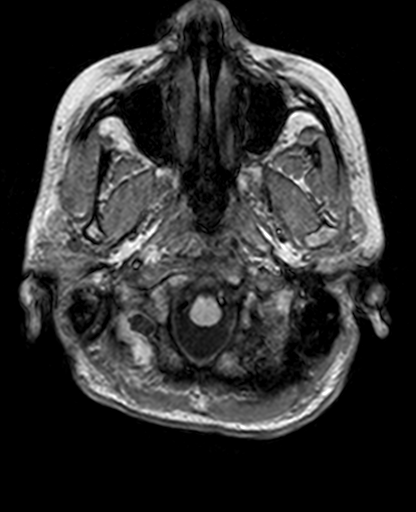
[im 27/80]
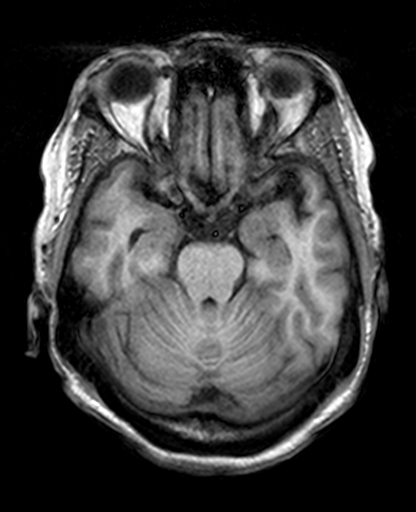
[im 36/80]
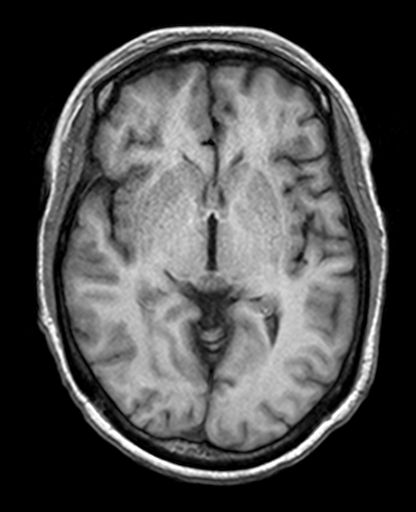
[im 44/80]
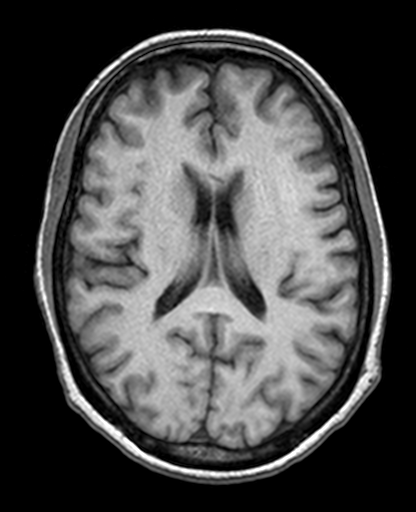
[im 53/80]
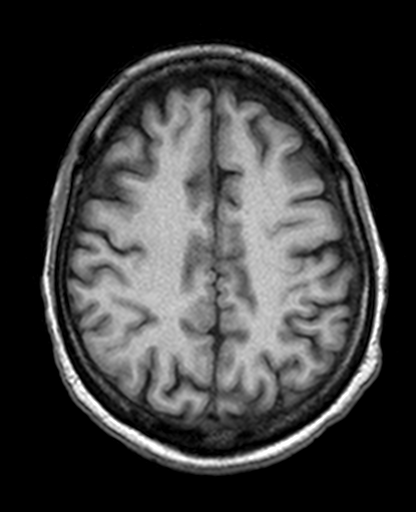
[im 71/80]
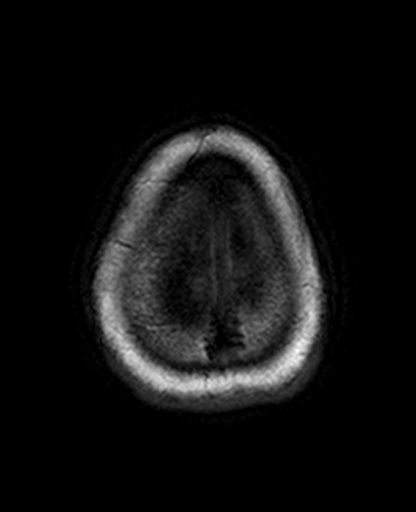
[im 80/80]
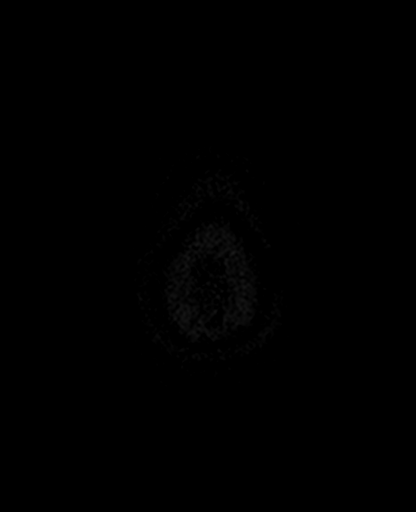

[Series 11: T2 · coronal · 5.0mm · 0.60mm/px · 3 of 28 slices shown (2 of 2)]
[im 1/28]
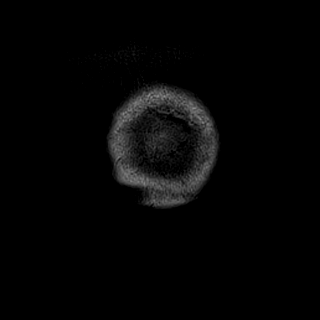
[im 14/28]
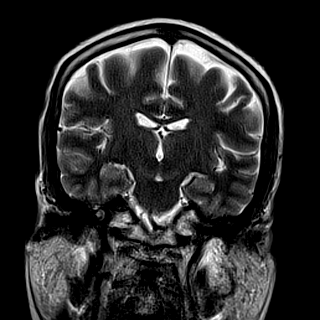
[im 28/28]
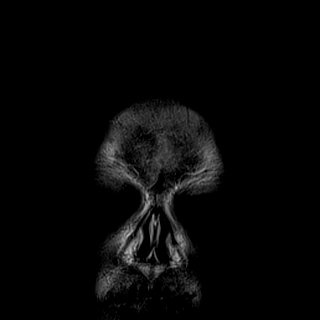

[41 of 48 positions shown; findings below may reference images not displayed]

FINDINGS: Brain: No acute infarction, hemorrhage, hydrocephalus, extra-axial
collection or mass lesion. Partially empty sella, chronic and
incidental in this setting.

Vascular: Major flow voids are preserved.

Skull and upper cervical spine: Negative for marrow lesion.

Sinuses/Orbits: Negative
IMPRESSION: Stable and negative brain MRI.

## 2018-04-29 IMAGING — CT CT HEAD W/O CM
3 series · 16 of 47 positions shown, 19 images · non-contrast
Comparison: Brain MRI 12/20/2016

CLINICAL DATA: Seizure, altered mental status

EXAM:
CT HEAD WITHOUT CONTRAST
TECHNIQUE: Contiguous axial images were obtained from the base of the skull
through the vertex without intravenous contrast.

[Series 2: head wo · axial · 0.45mm/px · z∈[+93,+218]mm · 10 of 31 slices shown, 13 images]
[im 3/31  brain]
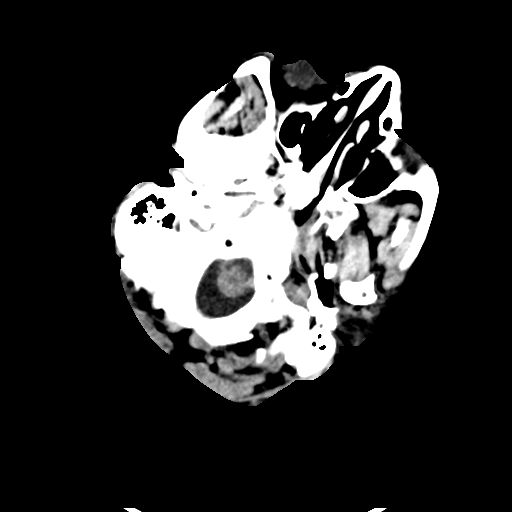
[im 3/31  bone]
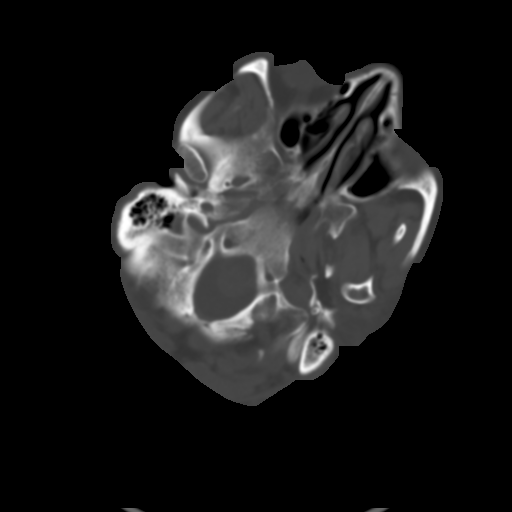
[im 6/31  brain]
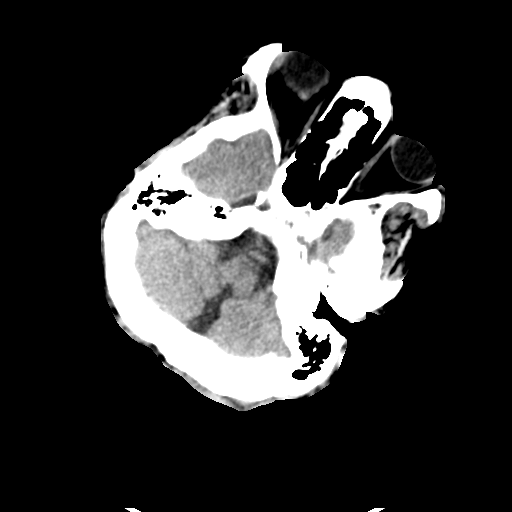
[im 9/31  brain]
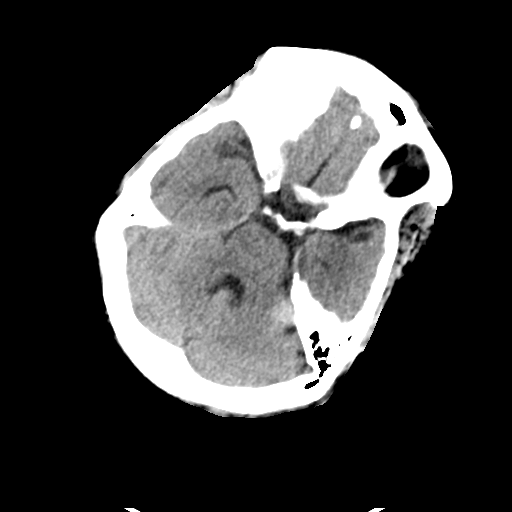
[im 11/31  brain]
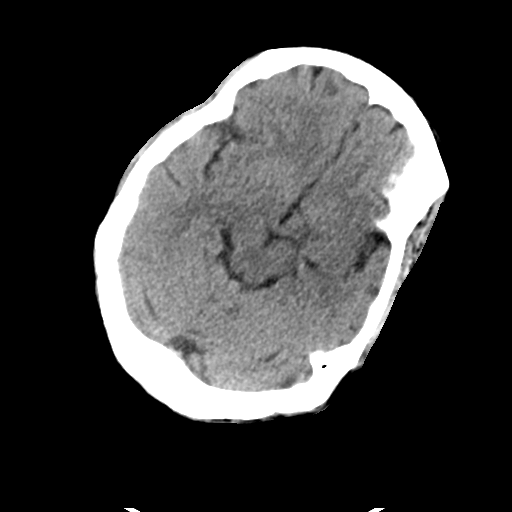
[im 14/31  brain]
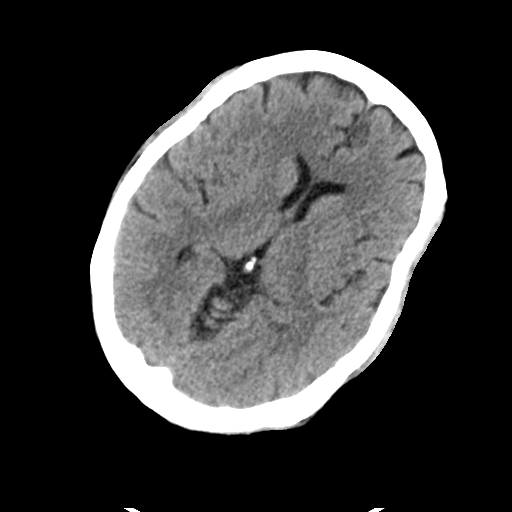
[im 14/31  bone]
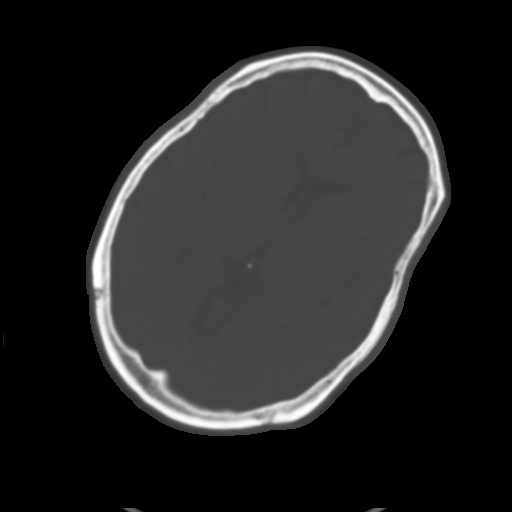
[im 17/31  brain]
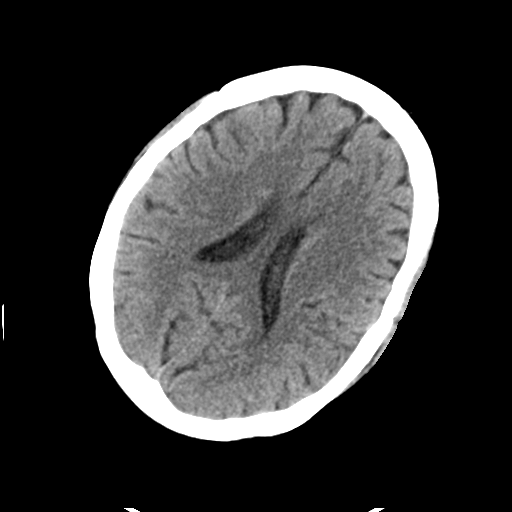
[im 20/31  brain]
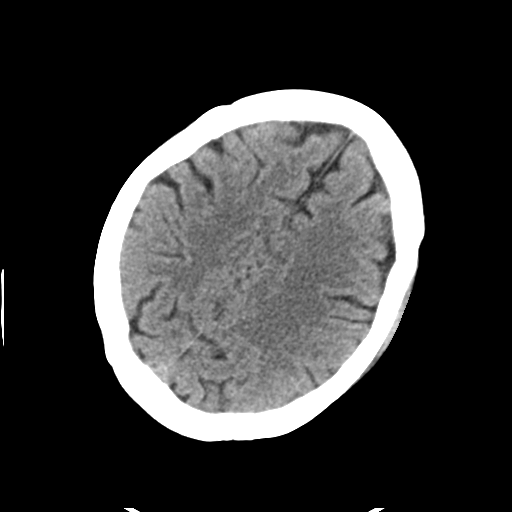
[im 23/31  brain]
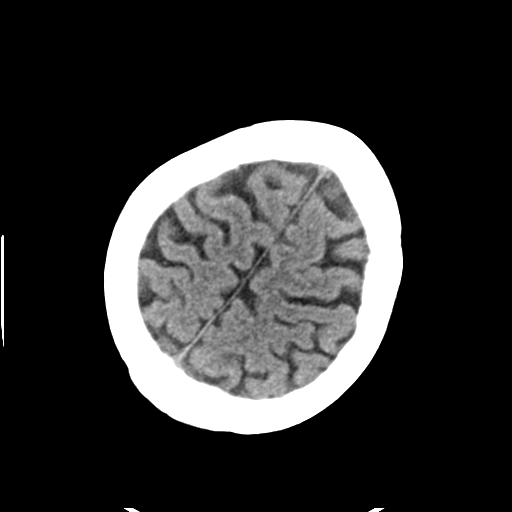
[im 25/31  brain]
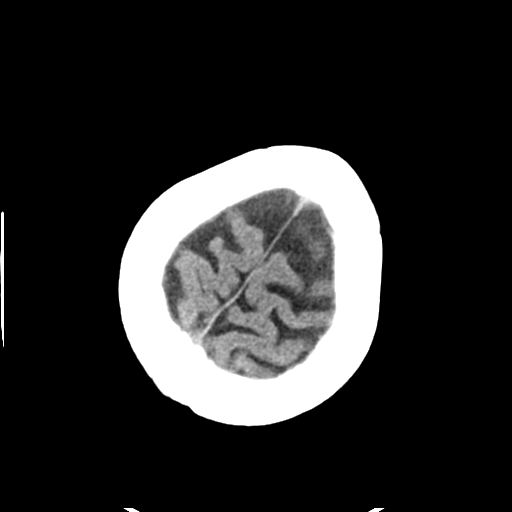
[im 25/31  bone]
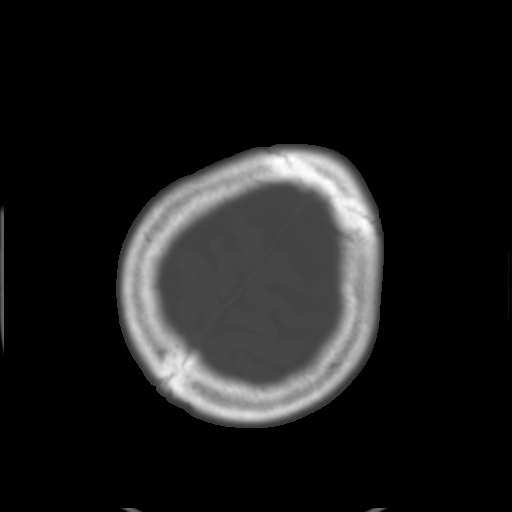
[im 28/31  brain]
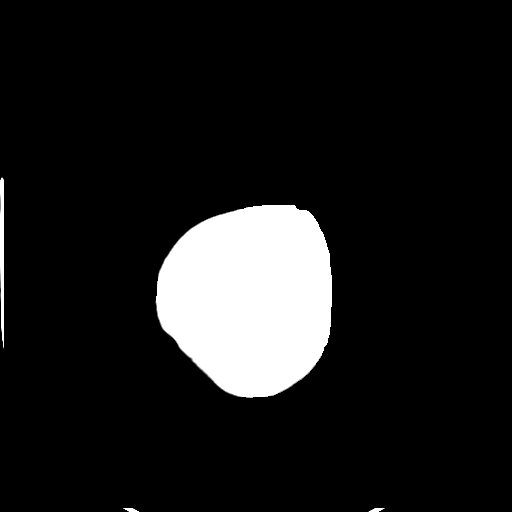

[Series 4: coronal soft tissue · coronal · 0.32mm/px · 3 of 71 slices shown]
[im 24/71  brain]
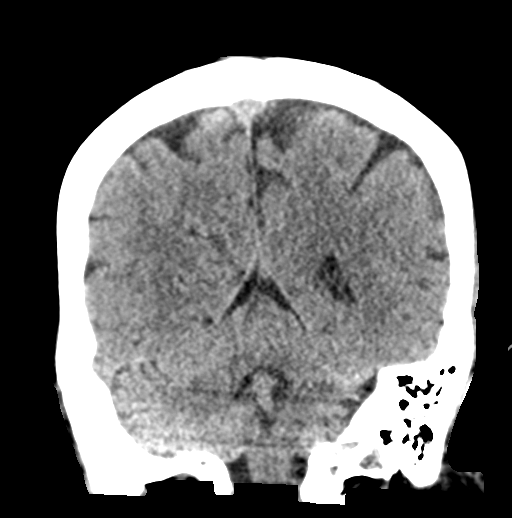
[im 32/71  brain]
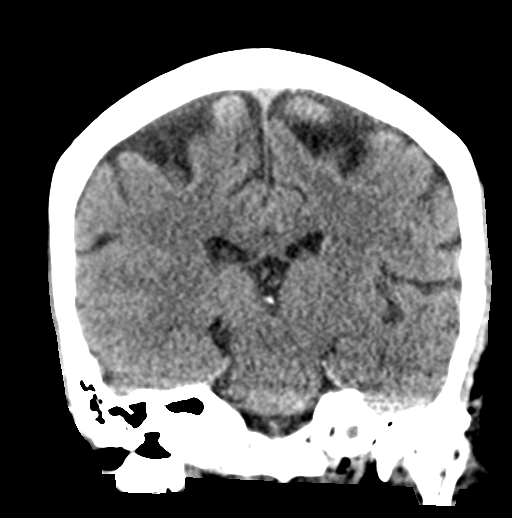
[im 39/71  brain]
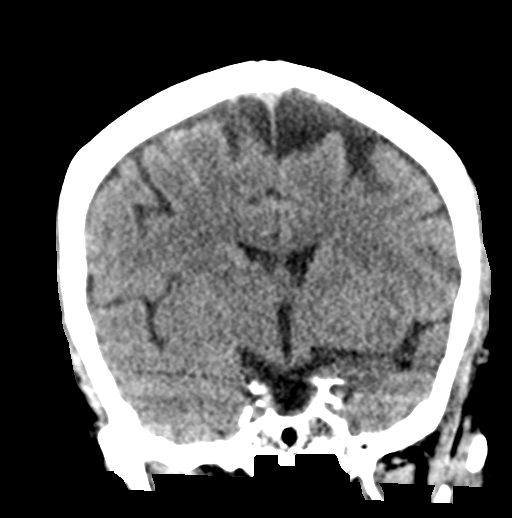

[Series 5: sagittal soft tissue · sagittal · 0.35mm/px · 3 of 57 slices shown]
[im 19/57  brain]
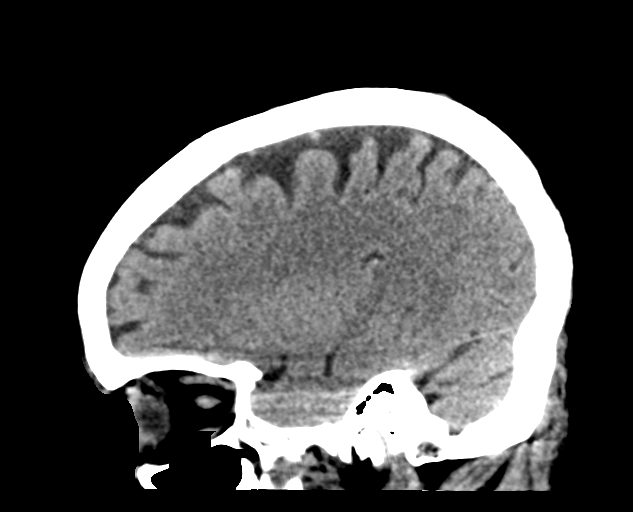
[im 29/57  brain]
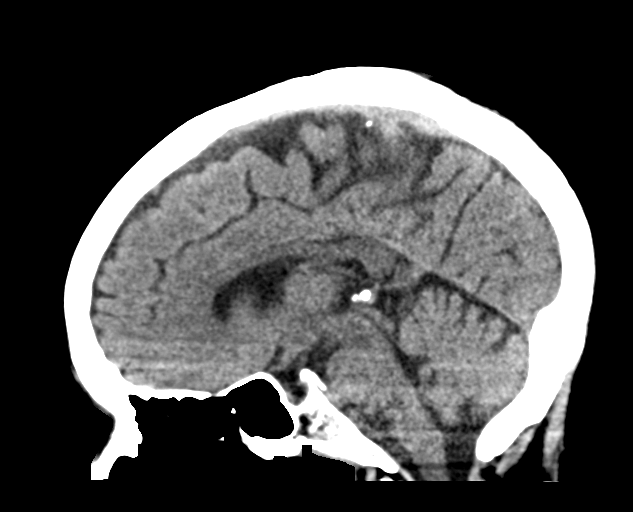
[im 38/57  brain]
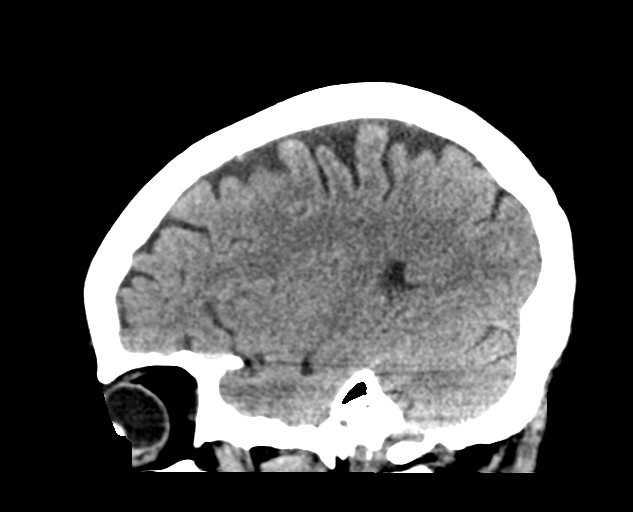

[16 of 47 positions shown; findings below may reference images not displayed]

FINDINGS: Brain: No mass lesion, intraparenchymal hemorrhage or extra-axial
collection. No evidence of acute cortical infarct. Brain parenchyma
and CSF-containing spaces are normal for age.

Vascular: No hyperdense vessel or unexpected calcification.

Skull: Normal visualized skull base, calvarium and extracranial soft
tissues.

Sinuses/Orbits: No sinus fluid levels or advanced mucosal
thickening. No mastoid effusion. Normal orbits.
IMPRESSION: Normal head CT.

## 2018-04-29 IMAGING — DX DG THORACIC SPINE 2V
3 series · 3 of 3 positions shown · non-contrast
Comparison: 10/21/2016

CLINICAL DATA: Seizure.  Back pain.

EXAM:
THORACIC SPINE 2 VIEWS

[t-spine ap]
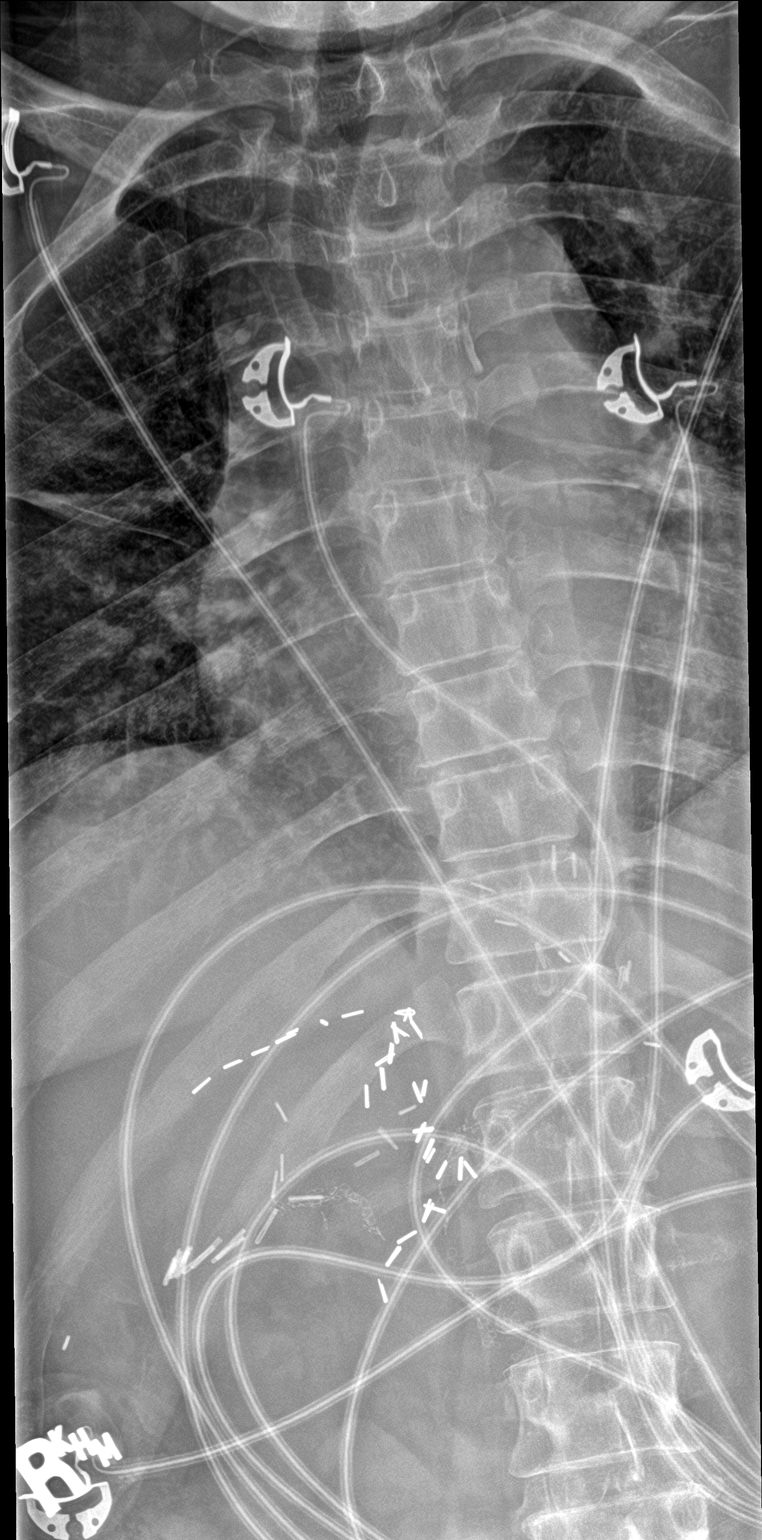

[t-spine lat (1 of 2)]
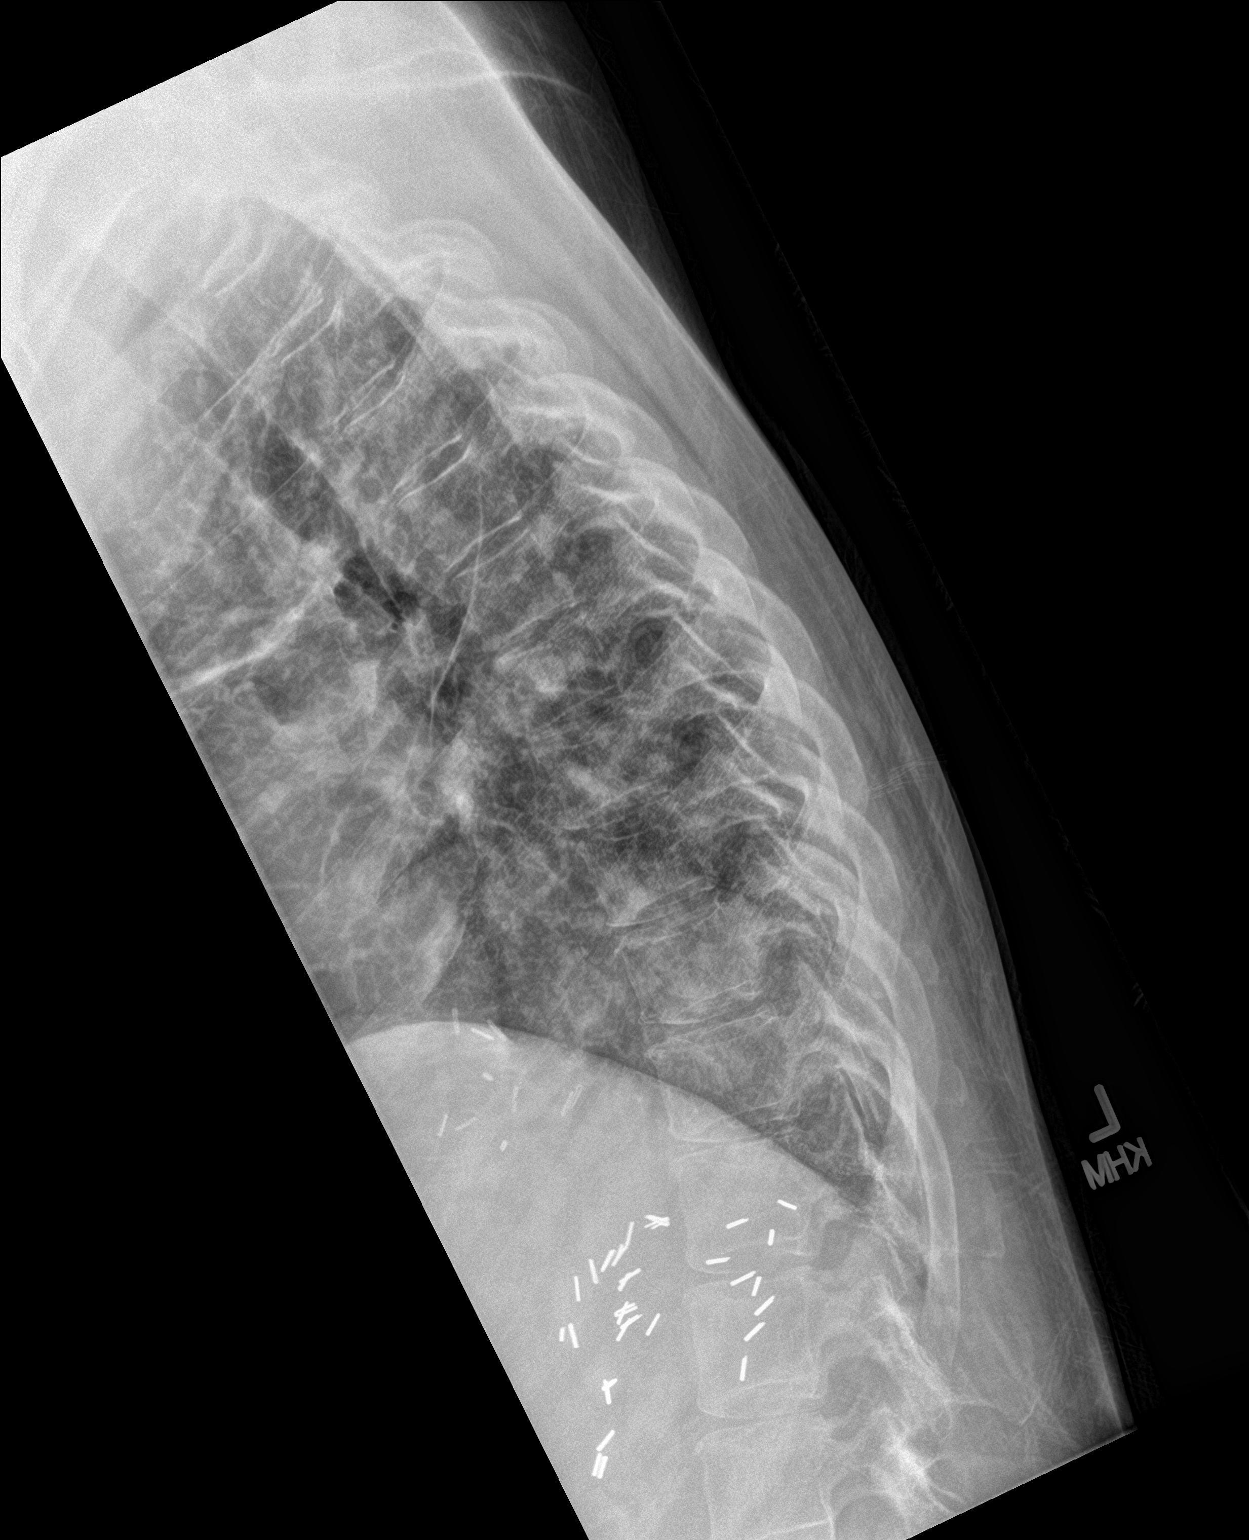

[t-spine lat (2 of 2)]
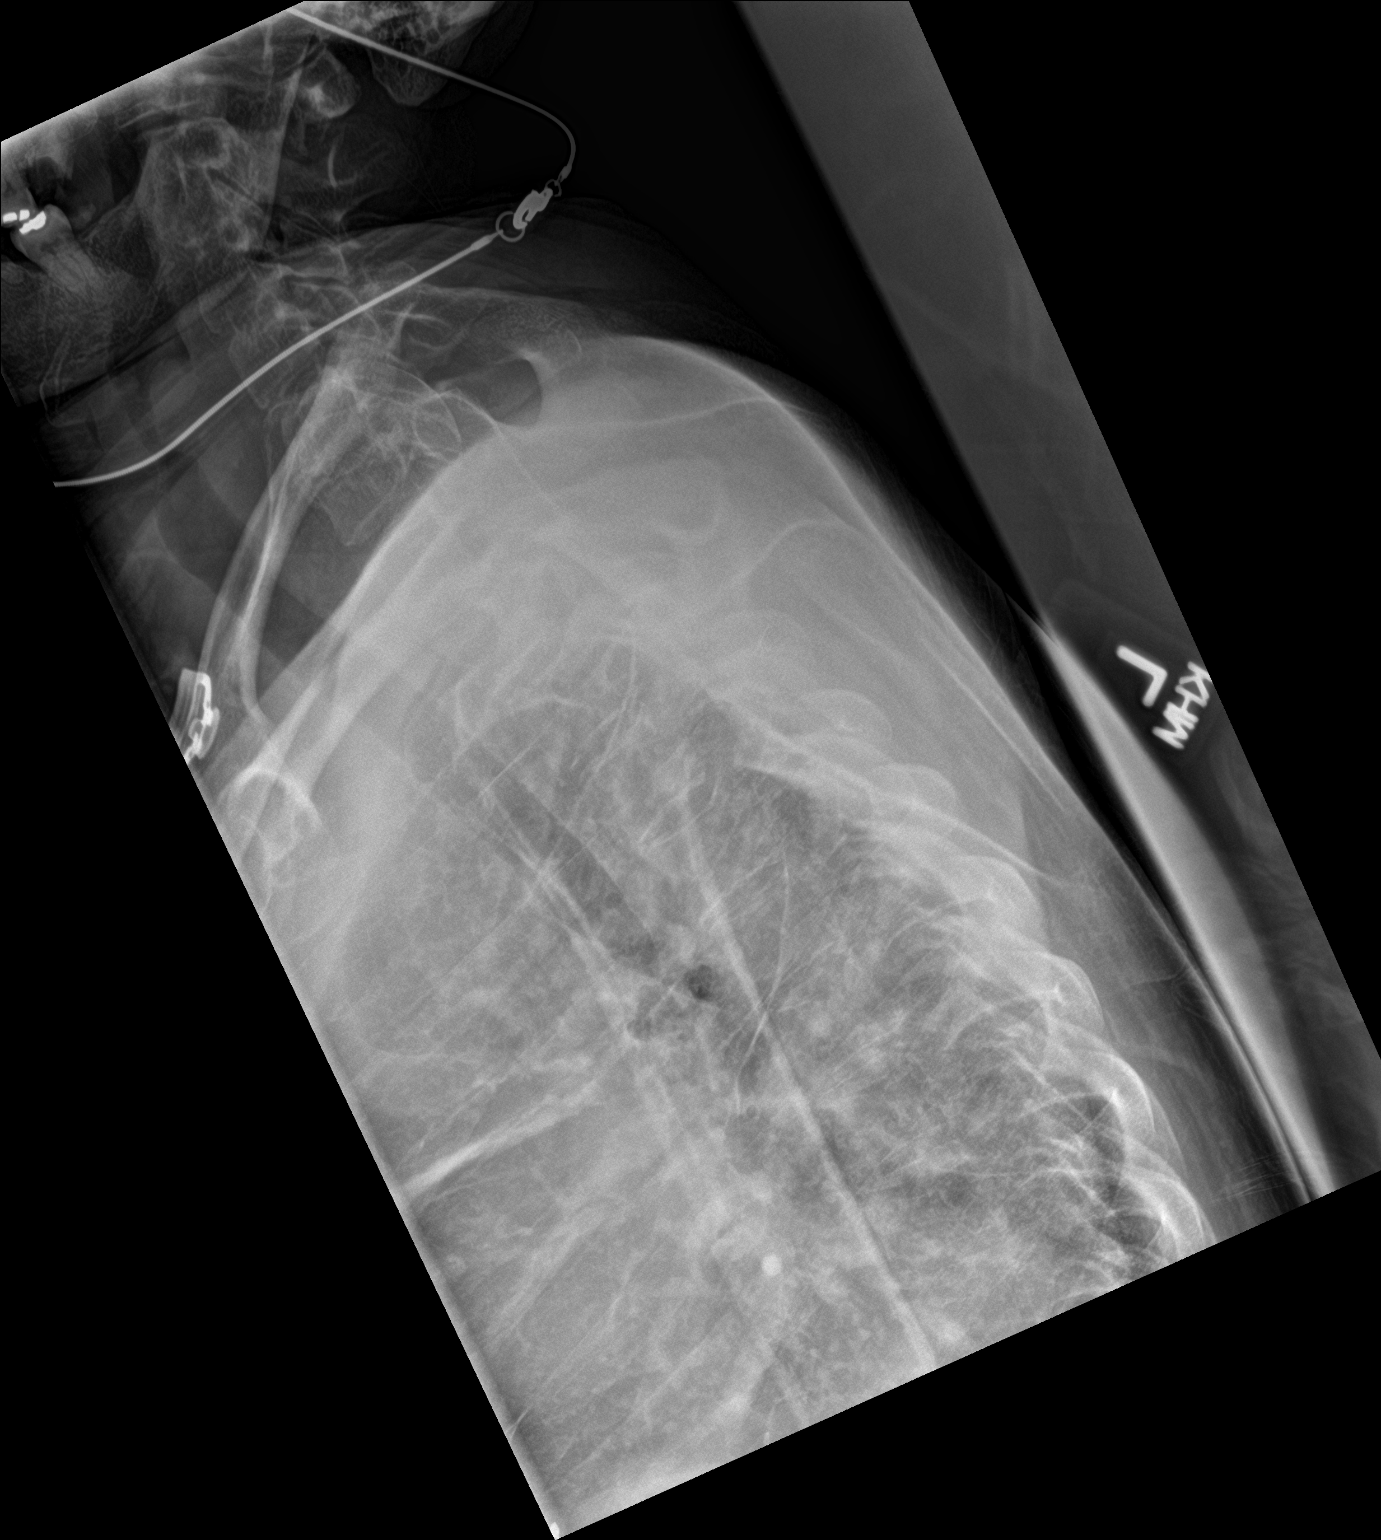

[3 of 3 positions shown; findings below may reference images not displayed]

FINDINGS: normal alignment. No fracture. No degenerative change or focal
lesion.
IMPRESSION: Negative.

## 2018-05-10 IMAGING — CT CT ABD-PELV W/ CM
2 of 5 series · 16 of 46 positions shown, 18 images · IV contrast (iopamidol)
Comparison: CT abdomen pelvis dated November 25, 2016.

CLINICAL DATA: Abdominal pain and fever.

EXAM:
CT ABDOMEN AND PELVIS WITH CONTRAST
TECHNIQUE: Multidetector CT imaging of the abdomen and pelvis was performed
using the standard protocol following bolus administration of
intravenous contrast.
CONTRAST:  100mL [id number] IOPAMIDOL ([id number]) INJECTION 61%

[Series 3: abd/ pelvis 5.0 i30f 2 · axial · 0.77mm/px · z∈[+692,+1092]mm · 13 of 90 slices shown, 15 images]
[im 5/90  soft-tissue]
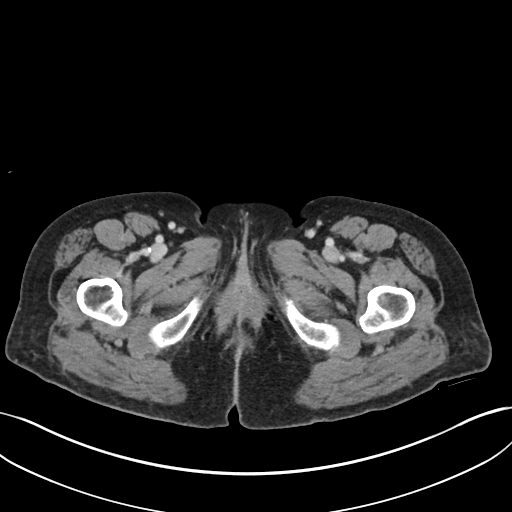
[im 5/90  bone]
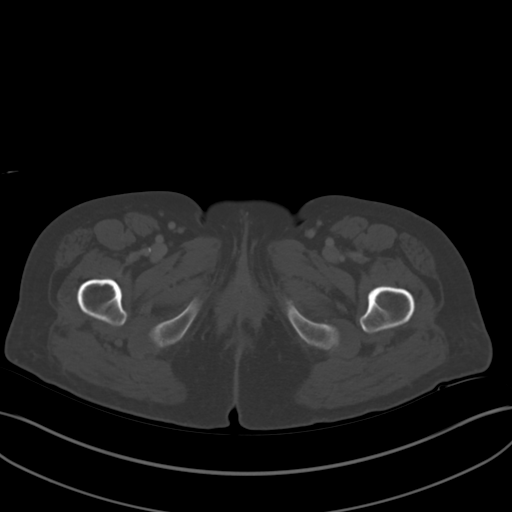
[im 14/90  soft-tissue]
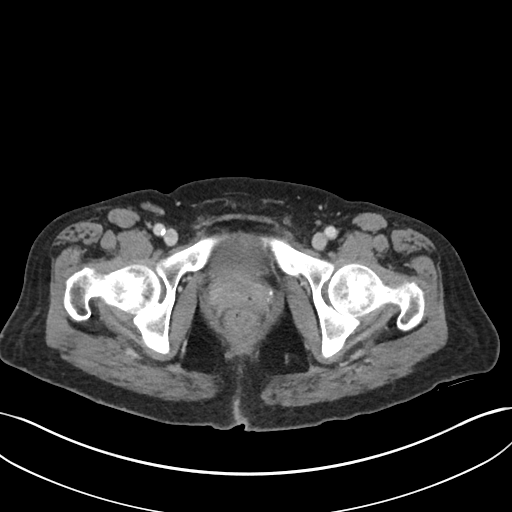
[im 18/90  soft-tissue]
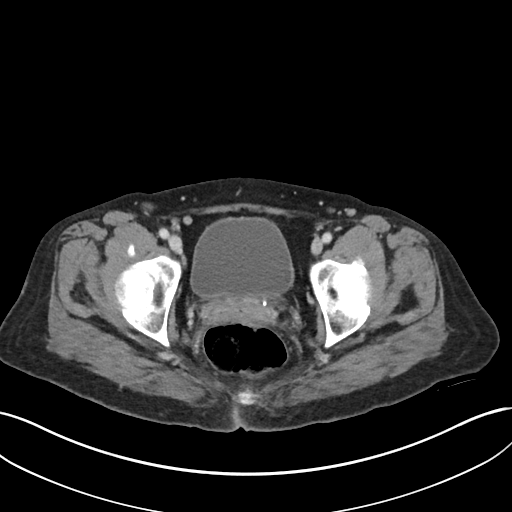
[im 27/90  soft-tissue]
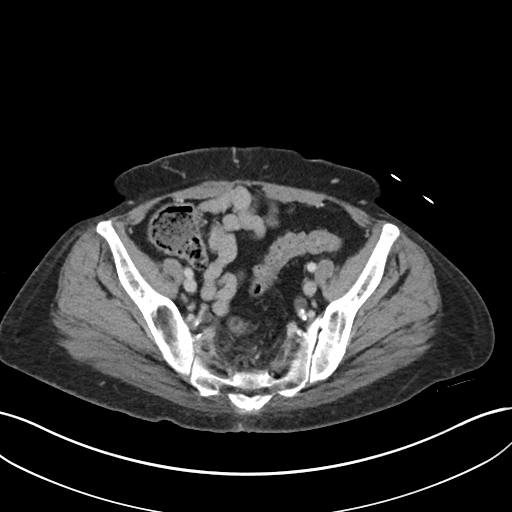
[im 32/90  soft-tissue]
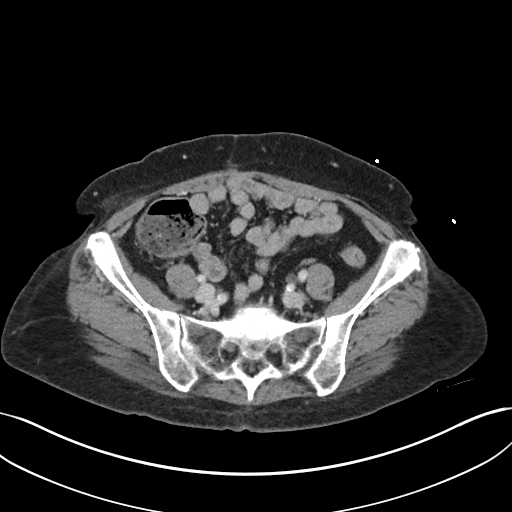
[im 41/90  soft-tissue]
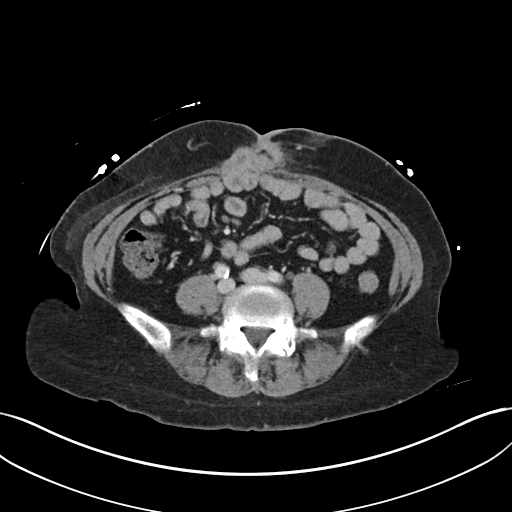
[im 45/90  soft-tissue]
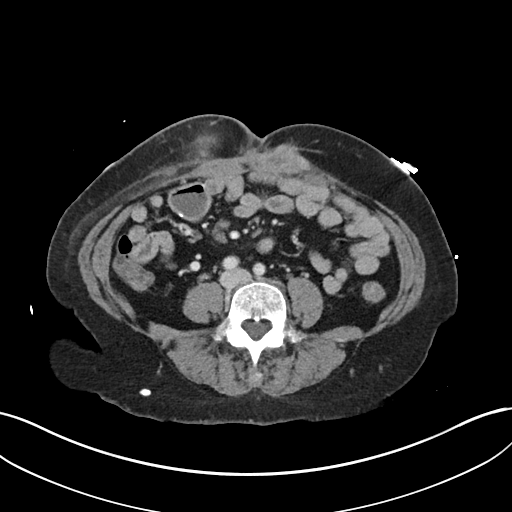
[im 49/90  soft-tissue]
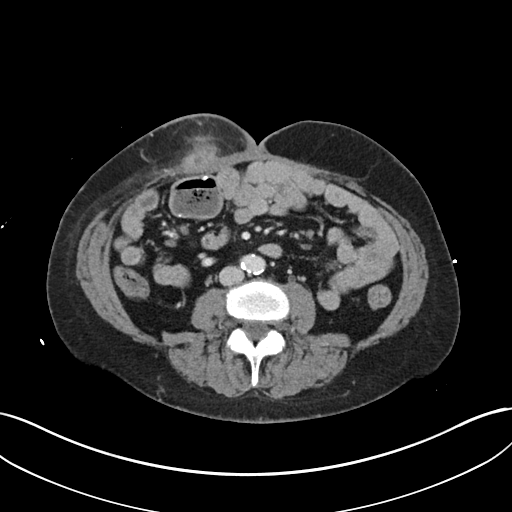
[im 58/90  soft-tissue]
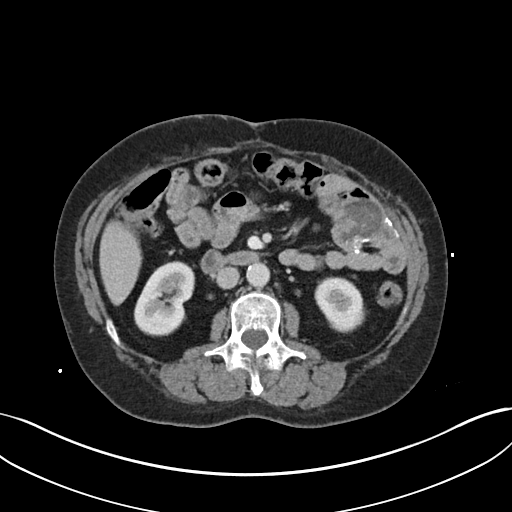
[im 58/90  bone]
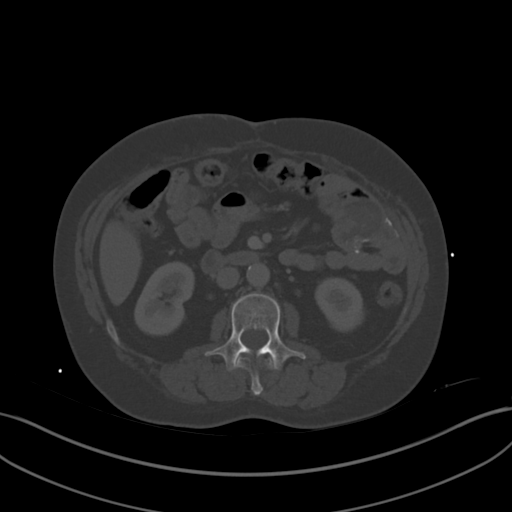
[im 63/90  soft-tissue]
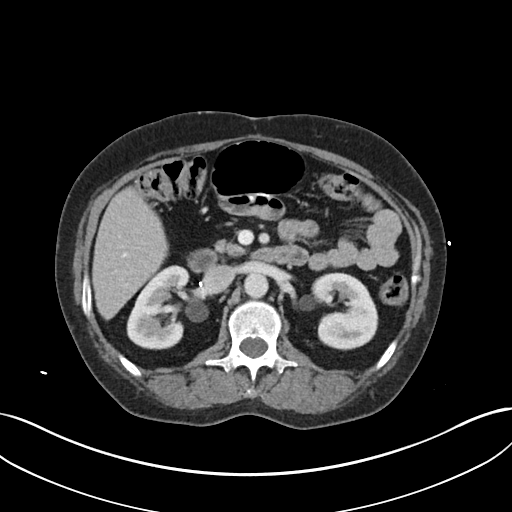
[im 72/90  soft-tissue]
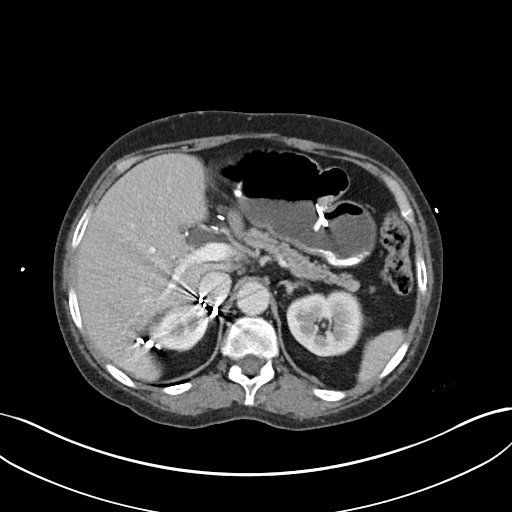
[im 76/90  soft-tissue]
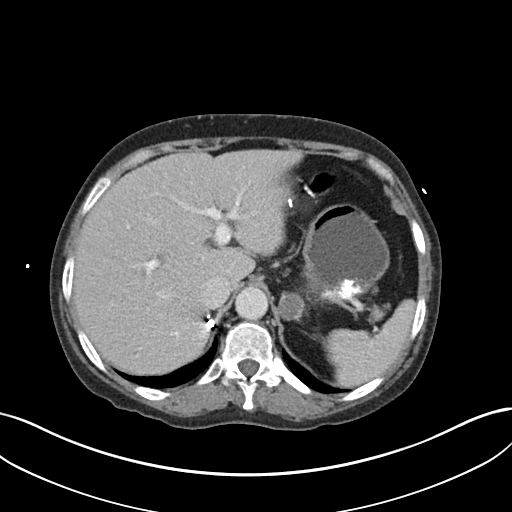
[im 85/90  soft-tissue]
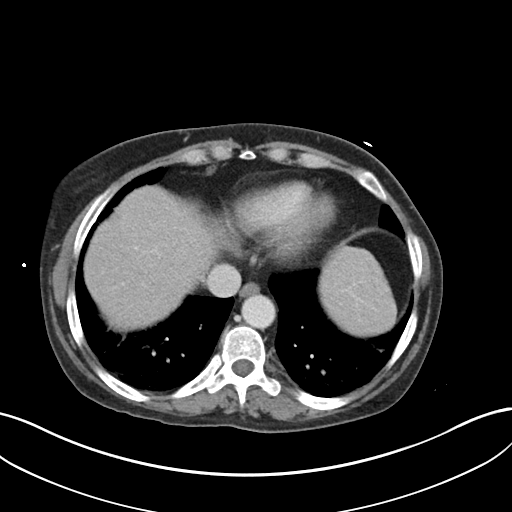

[Series 6: coronal soft tissue · coronal · 0.72mm/px · 3 of 87 slices shown]
[im 29/87  soft-tissue]
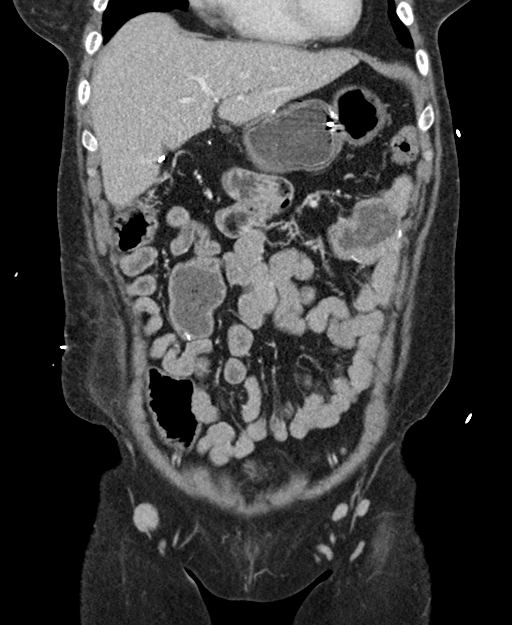
[im 39/87  soft-tissue]
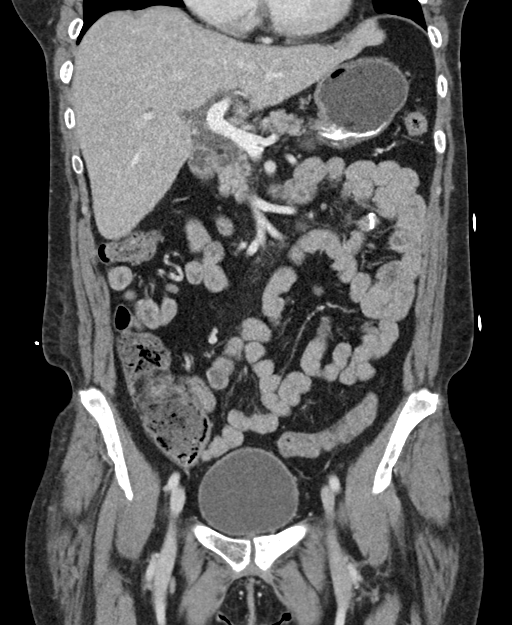
[im 48/87  soft-tissue]
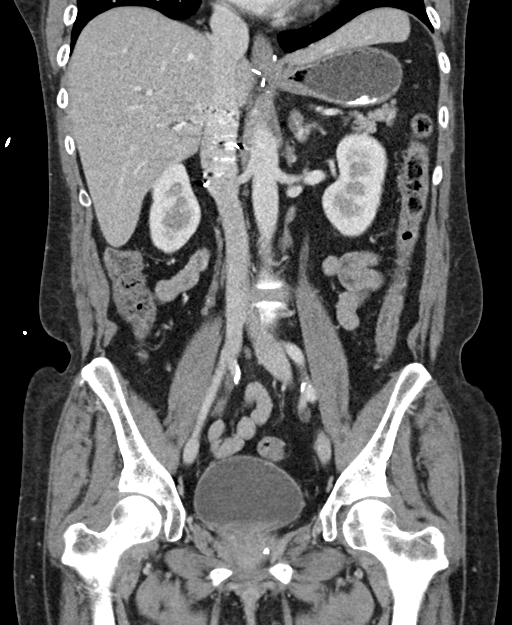

[16 of 46 positions shown; findings below may reference images not displayed]

FINDINGS: Lower chest: No acute abnormality.

Hepatobiliary: No focal liver abnormality is seen. Status post
cholecystectomy. Mild biliary dilatation is unchanged, and likely
related to post cholecystectomy state.

Pancreas: Atrophy. No pancreatic ductal dilatation or surrounding
inflammatory changes.

Spleen: Normal in size without focal abnormality.

Adrenals/Urinary Tract: The right adrenal gland is surgically
absent. Unchanged left adrenal adenoma. The kidneys are
unremarkable. No hydronephrosis or renal calculi. Mild fullness of
the left distal ureter. The bladder is unremarkable.

Stomach/Bowel: Postsurgical changes related to prior gastric bypass.
No evidence of bowel wall thickening, distention, or surrounding
inflammatory changes.

Vascular/Lymphatic: Aortic atherosclerosis. No enlarged abdominal or
pelvic lymph nodes.

Reproductive: Status post hysterectomy. No adnexal masses.

Other: Scarring of the anterior abdominal wall is again noted, with
increased somewhat nodular inflammatory stranding in the right
paramidline mid abdominal wall. No free fluid or pneumoperitoneum.

Musculoskeletal: Unchanged healing left superior and inferior pubic
rami fractures. No new fracture.
IMPRESSION: 1. Increased somewhat nodular inflammatory stranding in the right
paramidline mid abdominal wall at the site of prior surgery, which
could reflect cellulitis. No definite drainable fluid collection.
2. Mild fullness of the left distal ureter without evidence of
obstructing calculus.
3.  Aortic atherosclerosis ([id number]).
4. Unchanged healing left superior and inferior pubic rami
fractures.

## 2018-05-10 IMAGING — DX DG CHEST 2V
2 series · 2 of 2 positions shown · non-contrast
Comparison: Chest x-ray of September 23, 2016

CLINICAL DATA: Shortness of breath, weakness, and chest pain.
Painful knot in the abdomen. Current smoker.

EXAM:
CHEST  2 VIEW

[chest pa]
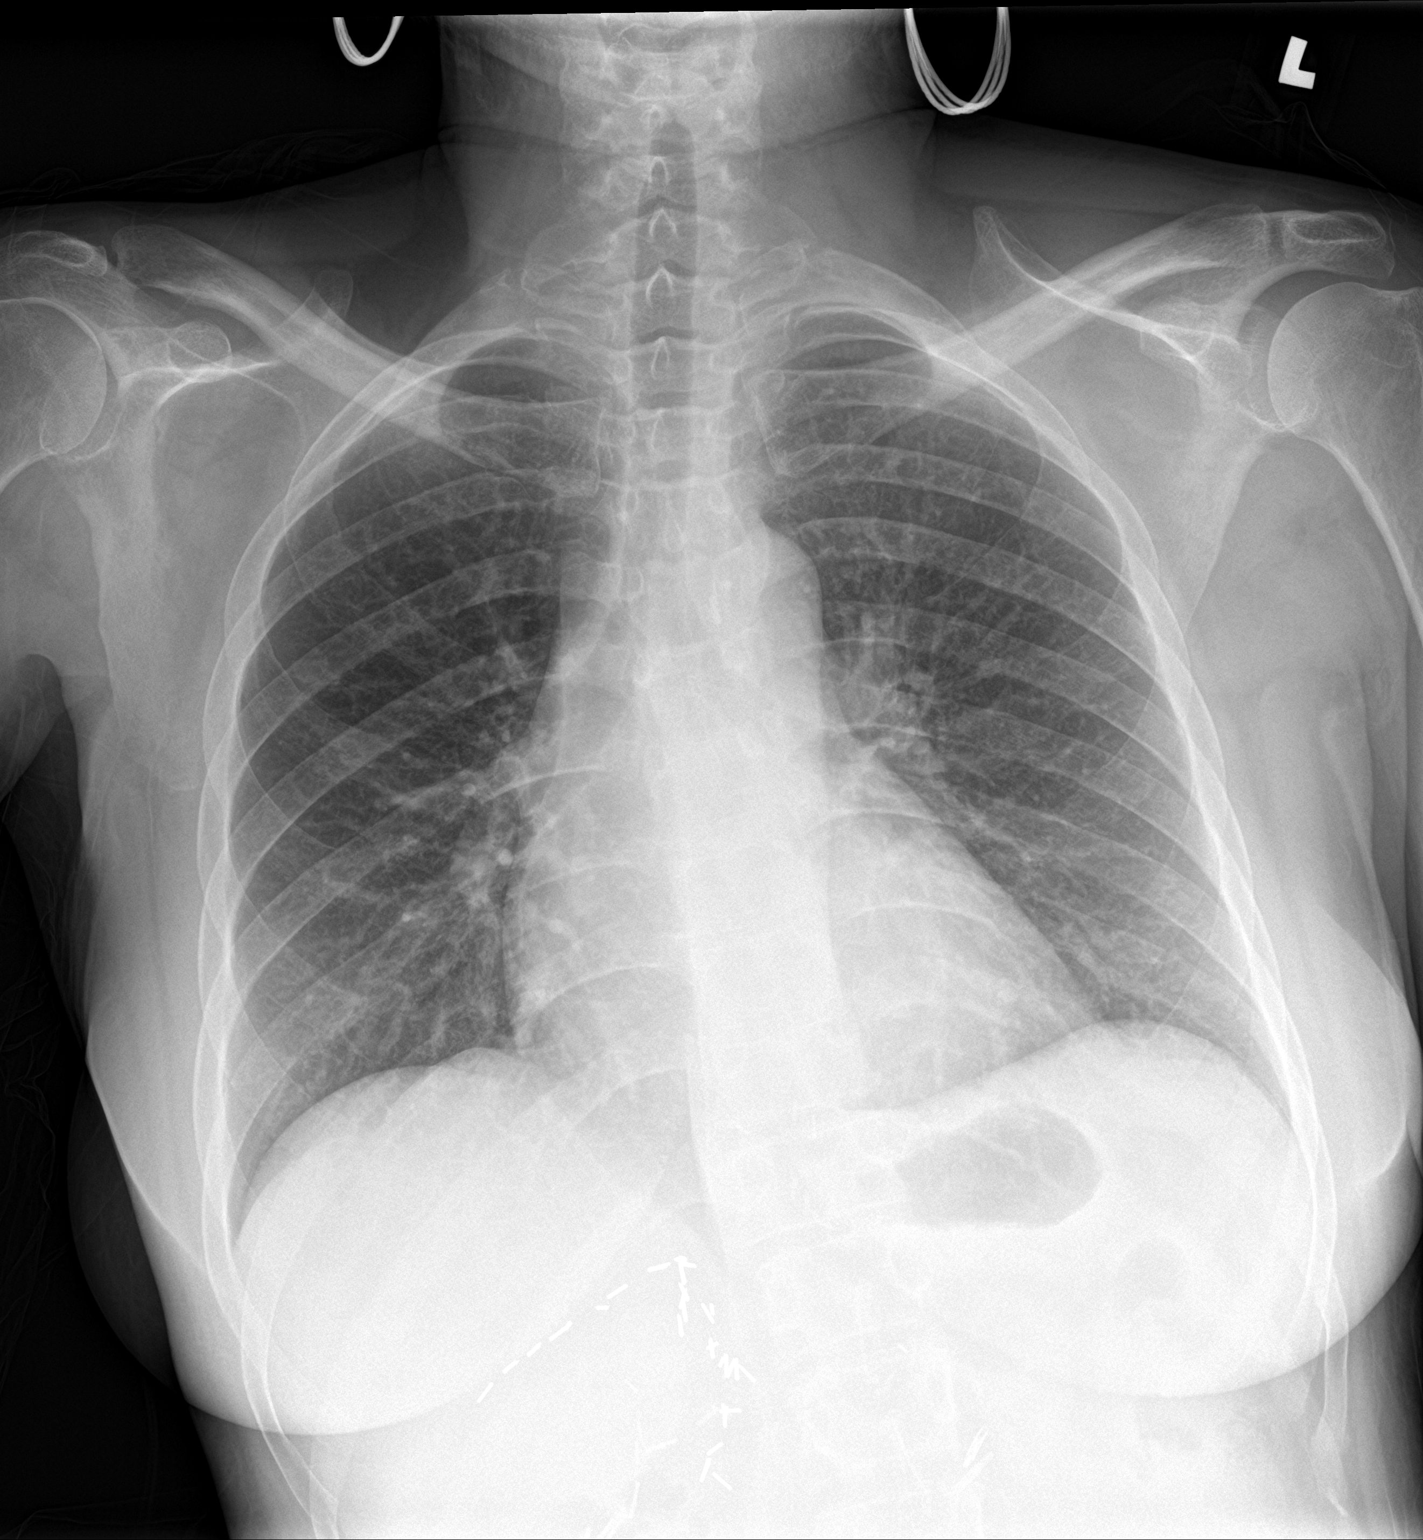

[chest lat]
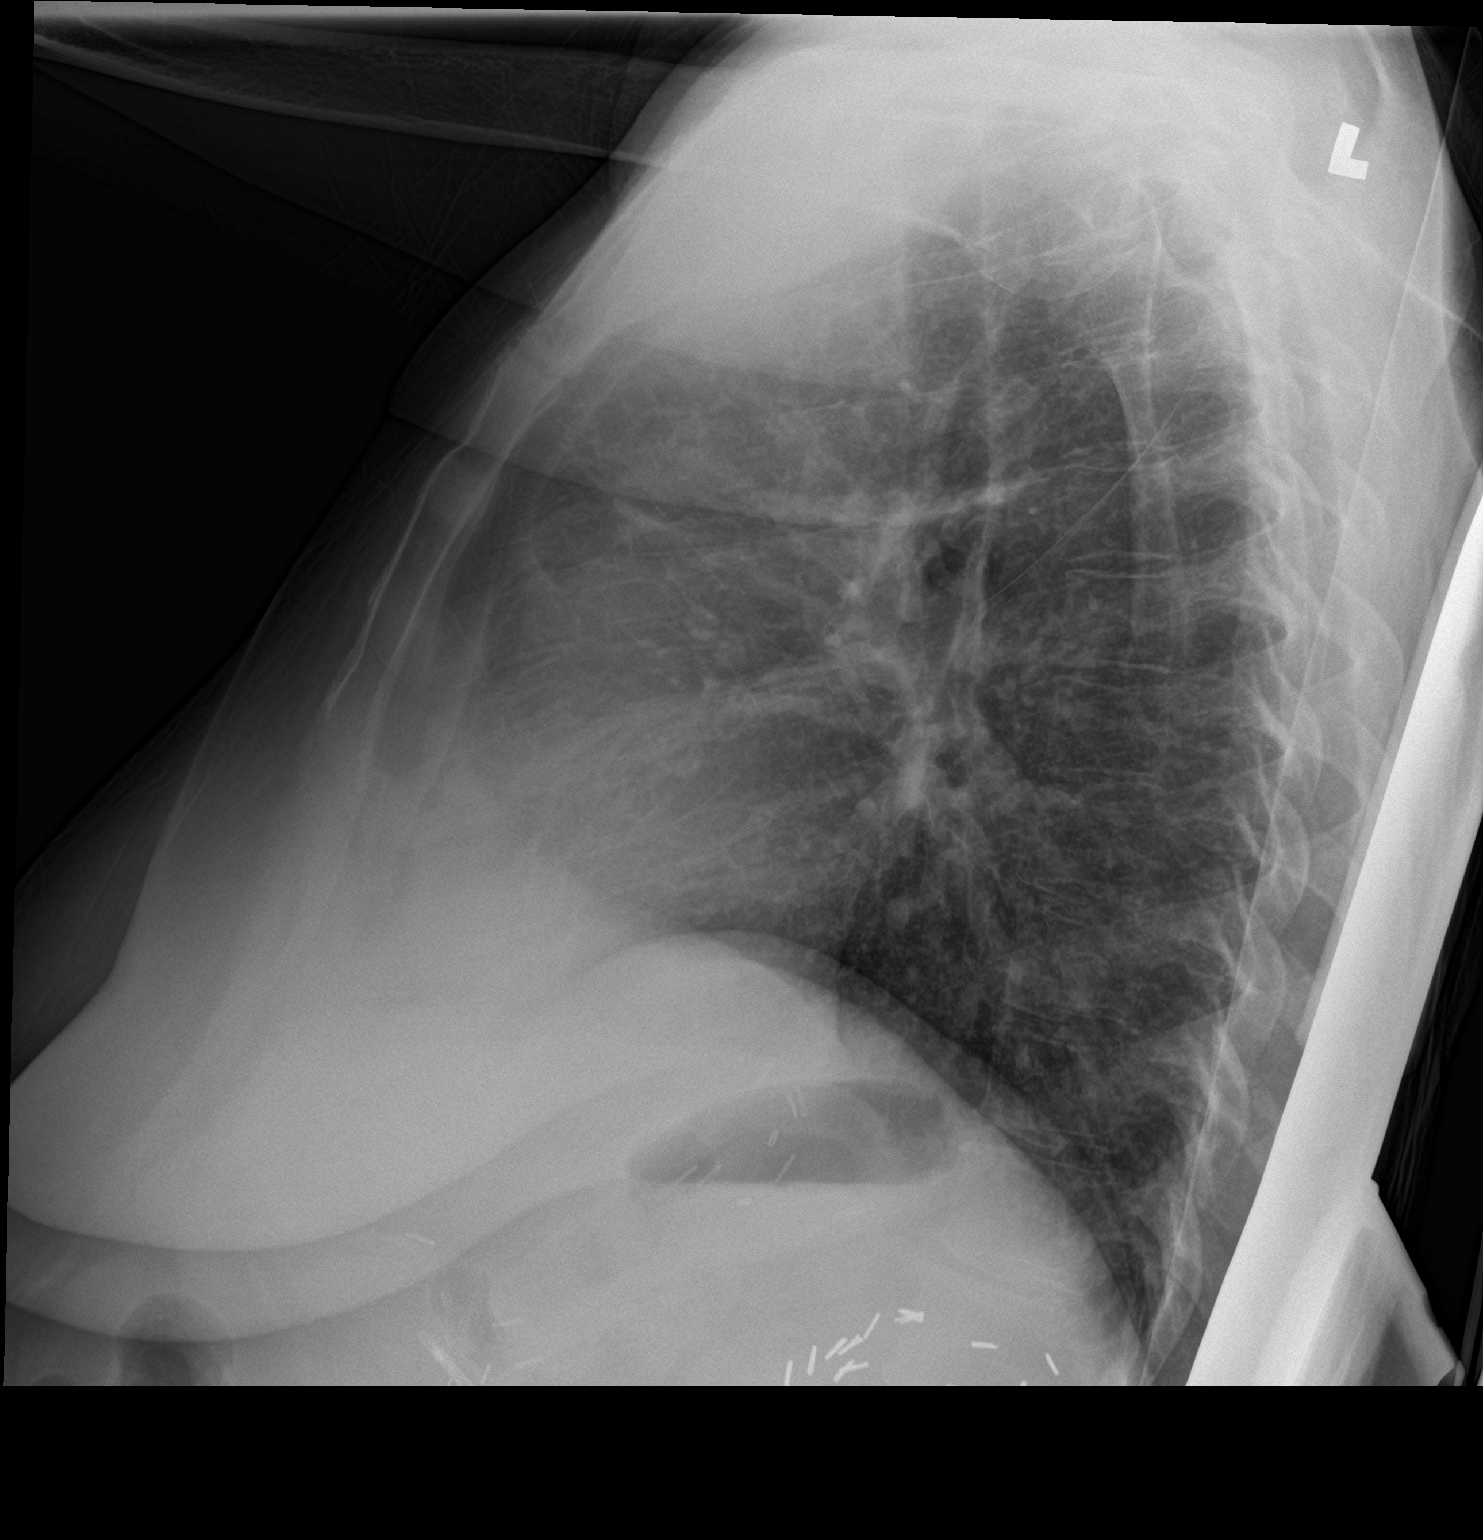

[2 of 2 positions shown; findings below may reference images not displayed]

FINDINGS: The lungs are adequately inflated and clear. The heart and pulmonary
vascularity are normal. The mediastinum is normal in width. There is
no pleural effusion. The bony thorax exhibits no acute abnormality.
IMPRESSION: There is no acute cardiopulmonary abnormality.

## 2018-05-12 IMAGING — CT CT ABD-PELV W/ CM
2 of 5 series · 15 of 46 positions shown, 17 images · IV contrast (Isovue)
Comparison: Prior CT from 01/02/2017.

CLINICAL DATA: Initial evaluation for acute abdominal infection.

EXAM:
CT ABDOMEN AND PELVIS WITH CONTRAST
TECHNIQUE: Multidetector CT imaging of the abdomen and pelvis was performed
using the standard protocol following bolus administration of
intravenous contrast.
CONTRAST:  100mL [id number] IOPAMIDOL ([id number]) INJECTION 61%

[Series 5: coronal st · coronal · 0.82mm/px · 3 of 80 slices shown]
[im 27/80  soft-tissue]
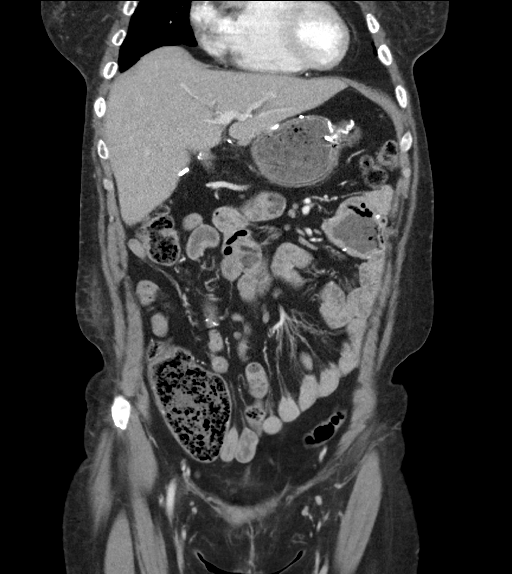
[im 36/80  soft-tissue]
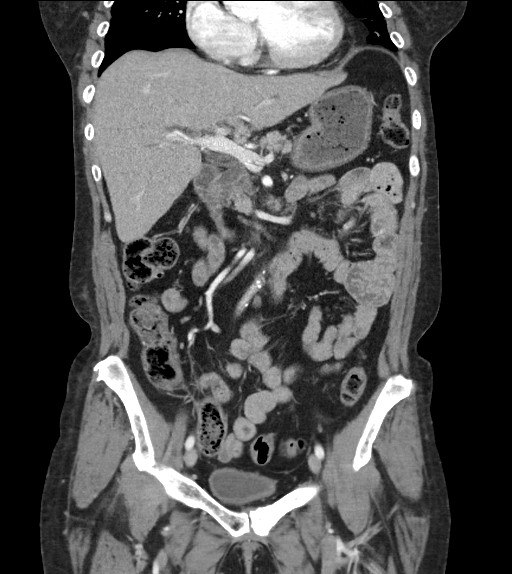
[im 44/80  soft-tissue]
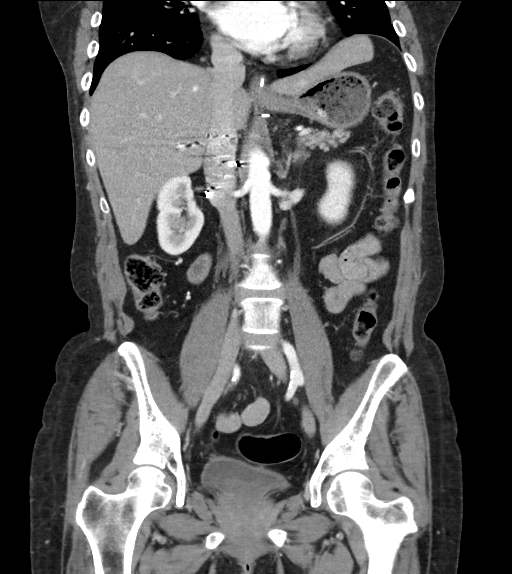

[Series 7: axial st · axial · 0.67mm/px · z∈[+885,+1310]mm · 12 of 95 slices shown, 14 images]
[im 5/95  soft-tissue]
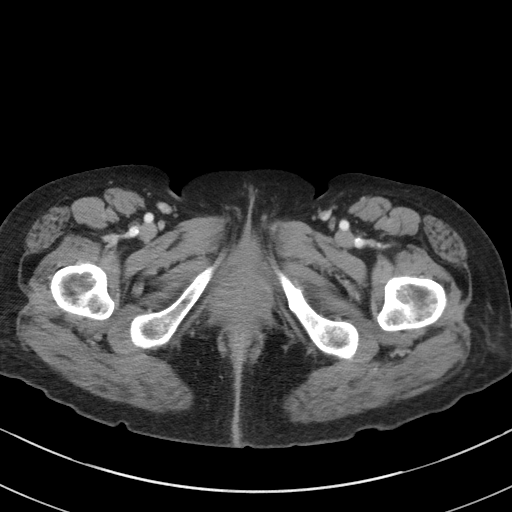
[im 5/95  bone]
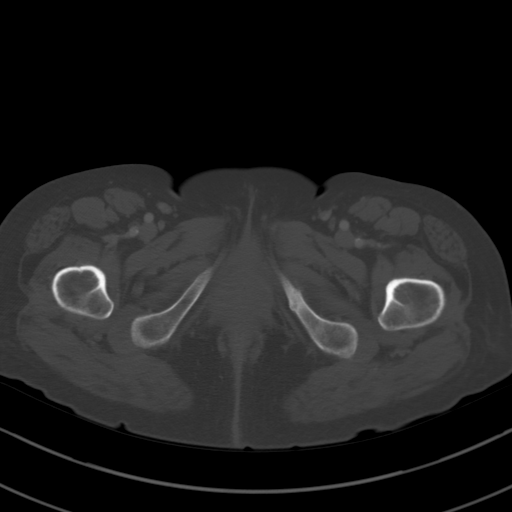
[im 15/95  soft-tissue]
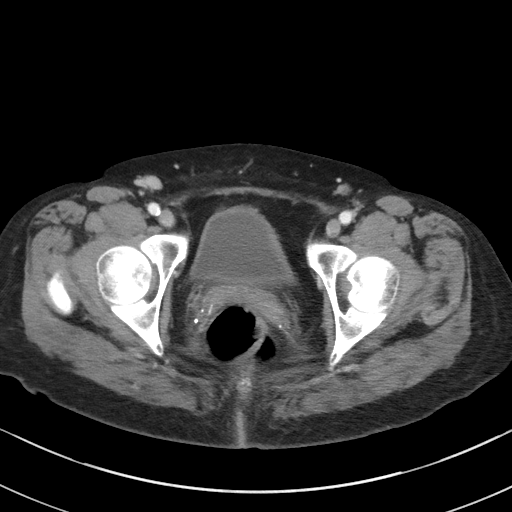
[im 20/95  soft-tissue]
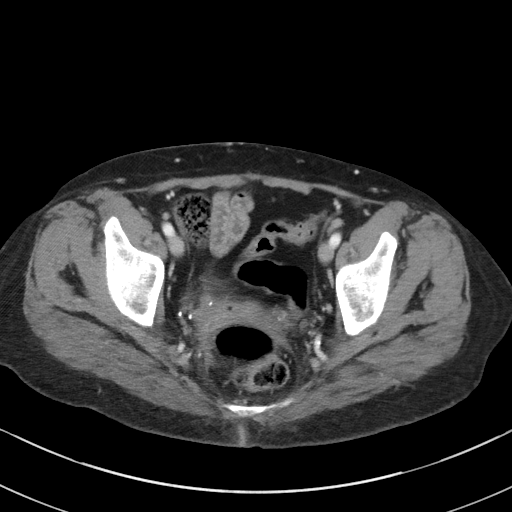
[im 30/95  soft-tissue]
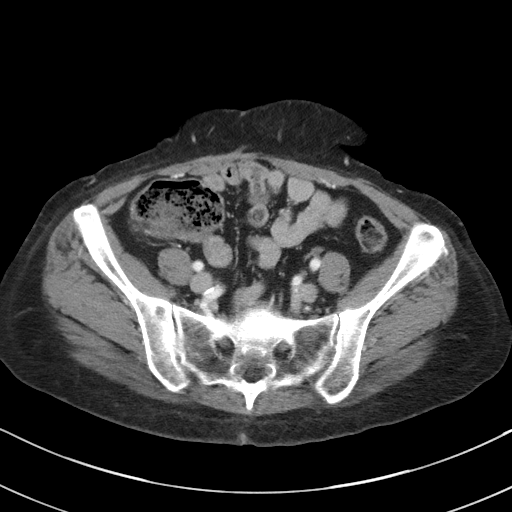
[im 35/95  soft-tissue]
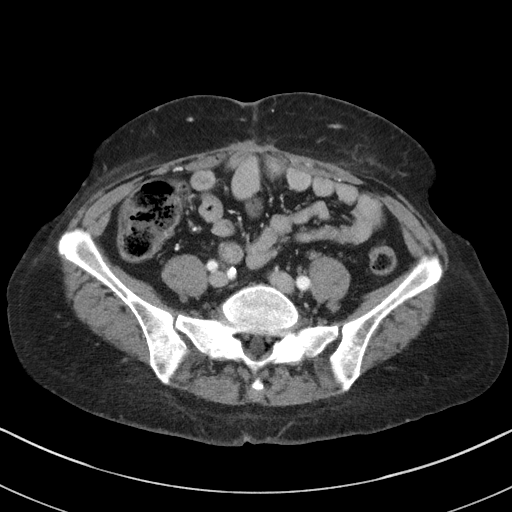
[im 45/95  soft-tissue]
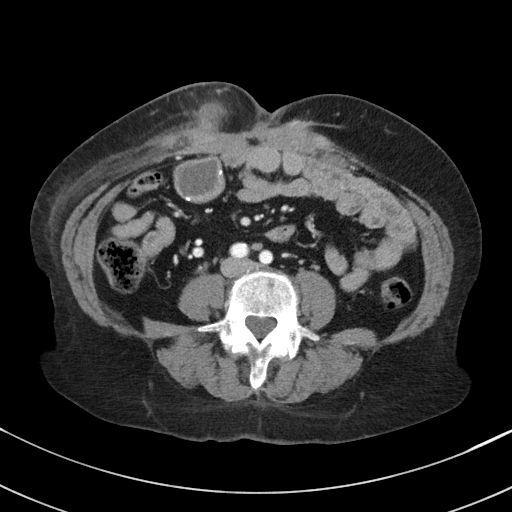
[im 50/95  soft-tissue]
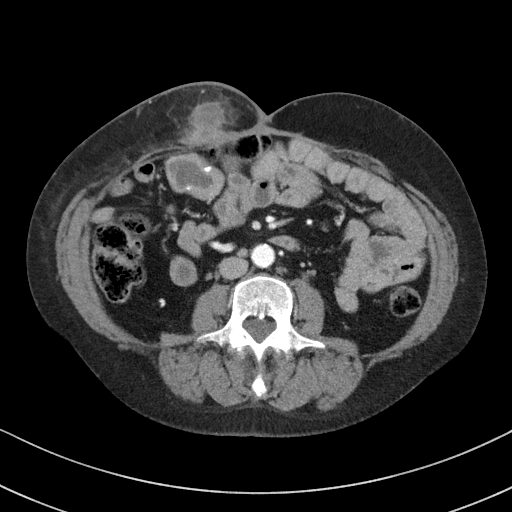
[im 60/95  soft-tissue]
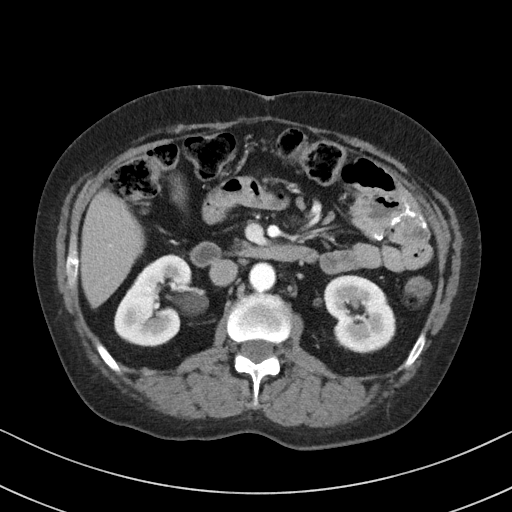
[im 65/95  soft-tissue]
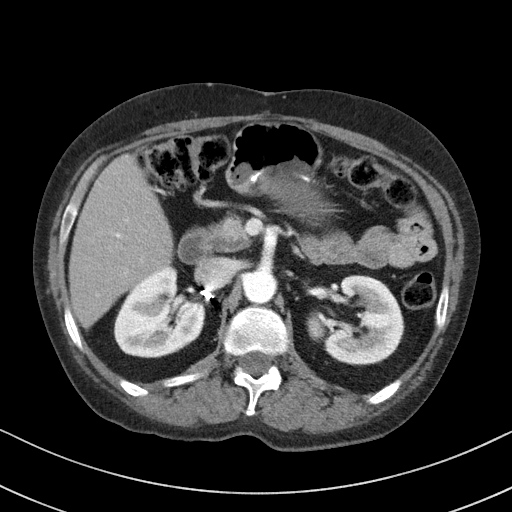
[im 65/95  bone]
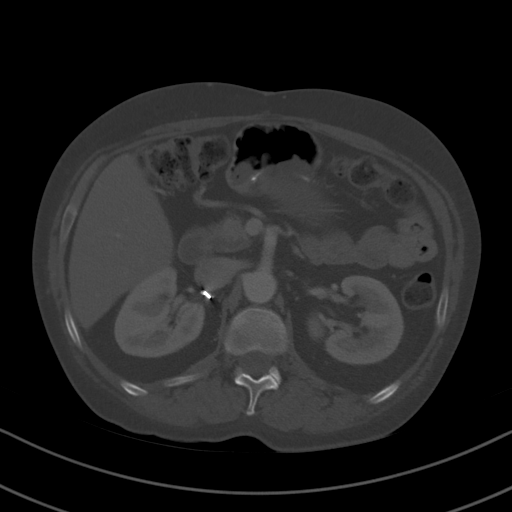
[im 75/95  soft-tissue]
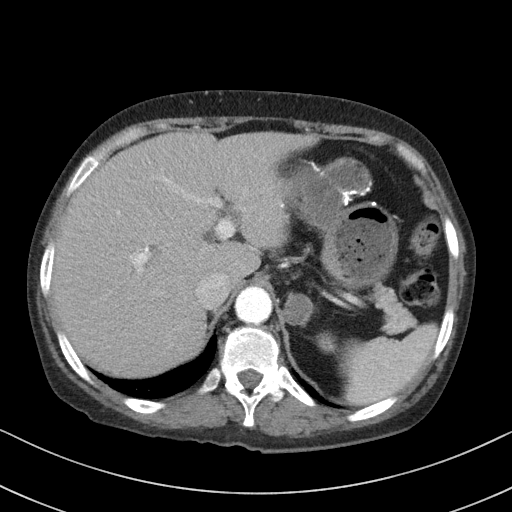
[im 80/95  soft-tissue]
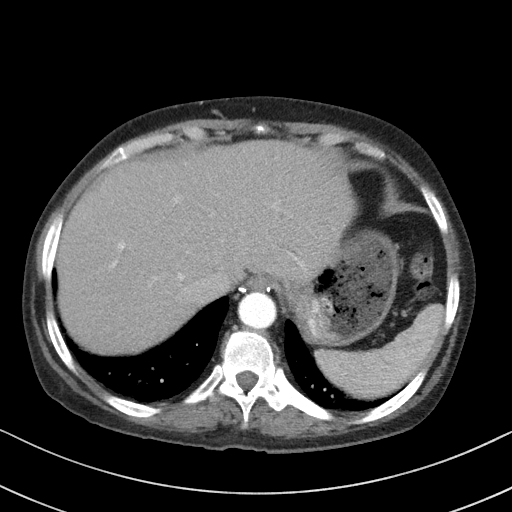
[im 90/95  soft-tissue]
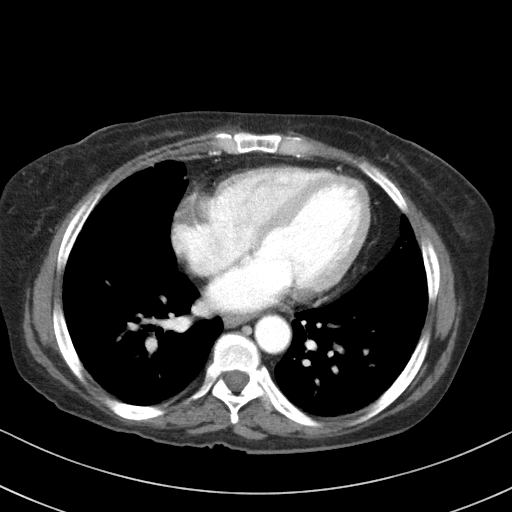

[15 of 46 positions shown; findings below may reference images not displayed]

FINDINGS: Lower chest: 6 mm nodule partially visualize within the right middle
lobe (series 8, image 1). Scattered atelectatic changes present
within the visualized lung bases. Visualized lungs are otherwise
clear.

Hepatobiliary: Liver demonstrates a normal contrast enhanced
appearance. Gallbladder surgically absent. Intra and extrahepatic
biliary dilatation like related to post cholecystectomy changes.

Pancreas: Pancreas somewhat atrophic in appearance but within normal
limits.

Spleen: Spleen within normal limits.

Adrenals/Urinary Tract: 2 cm left adrenal nodule again noted, stable
from previous. Right adrenal gland is surgically absent. Kidneys
equal in size with symmetric enhancement. No nephrolithiasis,
hydronephrosis, or focal enhancing renal mass. No hydroureter.
Bladder largely decompressed without acute abnormality.

Stomach/Bowel: Postsurgical changes noted about the GE junction and
stomach. Stomach otherwise unremarkable. No evidence for bowel
obstruction. Several loops of bowel appear intact CT and ear
abdominal wall, like related underlying adhesive disease. No acute
inflammatory changes seen about the bowels.

Vascular/Lymphatic: Normal intravascular enhancement seen throughout
the intra-abdominal aorta and and its branch vessels. Scattered
aorto bi-iliac atherosclerotic disease. No aneurysm. No enlarged
intra-abdominopelvic lymph nodes.

Reproductive: Uterus is absent.  Ovaries not discretely identified.

Other: No free air or fluid.

Musculoskeletal: Healing left superior and inferior pubic rami
fractures again noted. No other acute osseous abnormality. No other
worrisome lytic or blastic osseous lesions.

Abnormal swelling with inflammatory stranding involving the
subcutaneous fat of the right paramedian ventral abdominal wall
again seen, worsened in appearance and increased in size relative to
recent CT. Area of involvement now measures approximately 3.8 x
cm (series 7, image 50), previously 3.2 x 2.3 cm). Central
hypodensity within this area suspicious for possible early abscess
(series 7, image 50). Process largely involves the subcutaneous fat,
although there is extension into the underlying rectus musculature.
No extension into the underlying peritoneal in at this time.
Additional postsurgical scarring along the midline and within the
left ventral abdominal wall again noted, stable.
IMPRESSION: 1. Interval worsening and inflammatory process involving the
subcutaneous fat of the right paramedian ventral abdomen, increased
in size as compared to recent CT from 01/02/2017. Developing central
hypodensity within this area suspicious for early/developing
abscess. No extension into the underlying peritoneum at this time.
2. No other new acute intra-abdominal or pelvic process.

## 2018-06-02 ENCOUNTER — Emergency Department (HOSPITAL_COMMUNITY): Payer: Medicare Other

## 2018-06-02 ENCOUNTER — Other Ambulatory Visit: Payer: Self-pay

## 2018-06-02 ENCOUNTER — Encounter (HOSPITAL_COMMUNITY): Payer: Self-pay | Admitting: Emergency Medicine

## 2018-06-02 ENCOUNTER — Emergency Department (HOSPITAL_COMMUNITY)
Admission: EM | Admit: 2018-06-02 | Discharge: 2018-06-03 | Disposition: A | Discharge status: Home / Self Care | Payer: Medicare Other | Attending: Emergency Medicine | Admitting: Emergency Medicine

## 2018-06-02 DIAGNOSIS — R1033 Periumbilical pain: Secondary | ICD-10-CM | POA: Diagnosis not present

## 2018-06-02 DIAGNOSIS — Z79899 Other long term (current) drug therapy: Secondary | ICD-10-CM | POA: Diagnosis not present

## 2018-06-02 DIAGNOSIS — R197 Diarrhea, unspecified: Secondary | ICD-10-CM | POA: Diagnosis not present

## 2018-06-02 DIAGNOSIS — I10 Essential (primary) hypertension: Secondary | ICD-10-CM

## 2018-06-02 DIAGNOSIS — L089 Local infection of the skin and subcutaneous tissue, unspecified: Secondary | ICD-10-CM | POA: Diagnosis not present

## 2018-06-02 DIAGNOSIS — F17210 Nicotine dependence, cigarettes, uncomplicated: Secondary | ICD-10-CM | POA: Insufficient documentation

## 2018-06-02 DIAGNOSIS — R1110 Vomiting, unspecified: Secondary | ICD-10-CM | POA: Diagnosis not present

## 2018-06-02 DIAGNOSIS — E860 Dehydration: Secondary | ICD-10-CM | POA: Insufficient documentation

## 2018-06-02 DIAGNOSIS — R109 Unspecified abdominal pain: Secondary | ICD-10-CM | POA: Diagnosis present

## 2018-06-02 LAB — CBC WITH DIFFERENTIAL/PLATELET
Abs Immature Granulocytes: 0.01 10*3/uL (ref 0.00–0.07)
BASOS PCT: 0 %
Basophils Absolute: 0 10*3/uL (ref 0.0–0.1)
EOS ABS: 0.1 10*3/uL (ref 0.0–0.5)
EOS PCT: 2 %
HCT: 36.2 % (ref 36.0–46.0)
Hemoglobin: 12 g/dL (ref 12.0–15.0)
Immature Granulocytes: 0 %
Lymphocytes Relative: 44 %
Lymphs Abs: 2.9 10*3/uL (ref 0.7–4.0)
MCH: 26.6 pg (ref 26.0–34.0)
MCHC: 33.1 g/dL (ref 30.0–36.0)
MCV: 80.3 fL (ref 80.0–100.0)
MONO ABS: 0.4 10*3/uL (ref 0.1–1.0)
Monocytes Relative: 6 %
Neutro Abs: 3.2 10*3/uL (ref 1.7–7.7)
Neutrophils Relative %: 48 %
PLATELETS: 401 10*3/uL — AB (ref 150–400)
RBC: 4.51 MIL/uL (ref 3.87–5.11)
RDW: 17.5 % — AB (ref 11.5–15.5)
WBC: 6.5 10*3/uL (ref 4.0–10.5)
nRBC: 0 % (ref 0.0–0.2)

## 2018-06-02 LAB — BASIC METABOLIC PANEL
Anion gap: 7 (ref 5–15)
BUN: 15 mg/dL (ref 6–20)
CHLORIDE: 109 mmol/L (ref 98–111)
CO2: 23 mmol/L (ref 22–32)
CREATININE: 0.7 mg/dL (ref 0.44–1.00)
Calcium: 9.2 mg/dL (ref 8.9–10.3)
GFR calc Af Amer: >60 mL/min (ref >60)
GFR calc non Af Amer: >60 mL/min (ref >60)
Glucose, Bld: 109 mg/dL — ABNORMAL HIGH (ref 70–99)
Potassium: 3.7 mmol/L (ref 3.5–5.1)
SODIUM: 139 mmol/L (ref 135–145)

## 2018-06-02 LAB — HEPATIC FUNCTION PANEL
ALT: 27 U/L (ref 0–44)
AST: 28 U/L (ref 15–41)
Albumin: 3.8 g/dL (ref 3.5–5.0)
Alkaline Phosphatase: 73 U/L (ref 38–126)
Total Bilirubin: 0.5 mg/dL (ref 0.3–1.2)
Total Protein: 7.6 g/dL (ref 6.5–8.1)

## 2018-06-02 LAB — LIPASE, BLOOD: LIPASE: 21 U/L (ref 11–51)

## 2018-06-02 IMAGING — DX DG ABDOMEN ACUTE W/ 1V CHEST
3 series · 3 of 3 positions shown · non-contrast
Comparison: CT 01/12/2017, radiograph 01/04/2017, 10/21/2016

CLINICAL DATA: Vomiting with abdominal pain

EXAM:
DG ABDOMEN ACUTE W/ 1V CHEST

[chest pa]
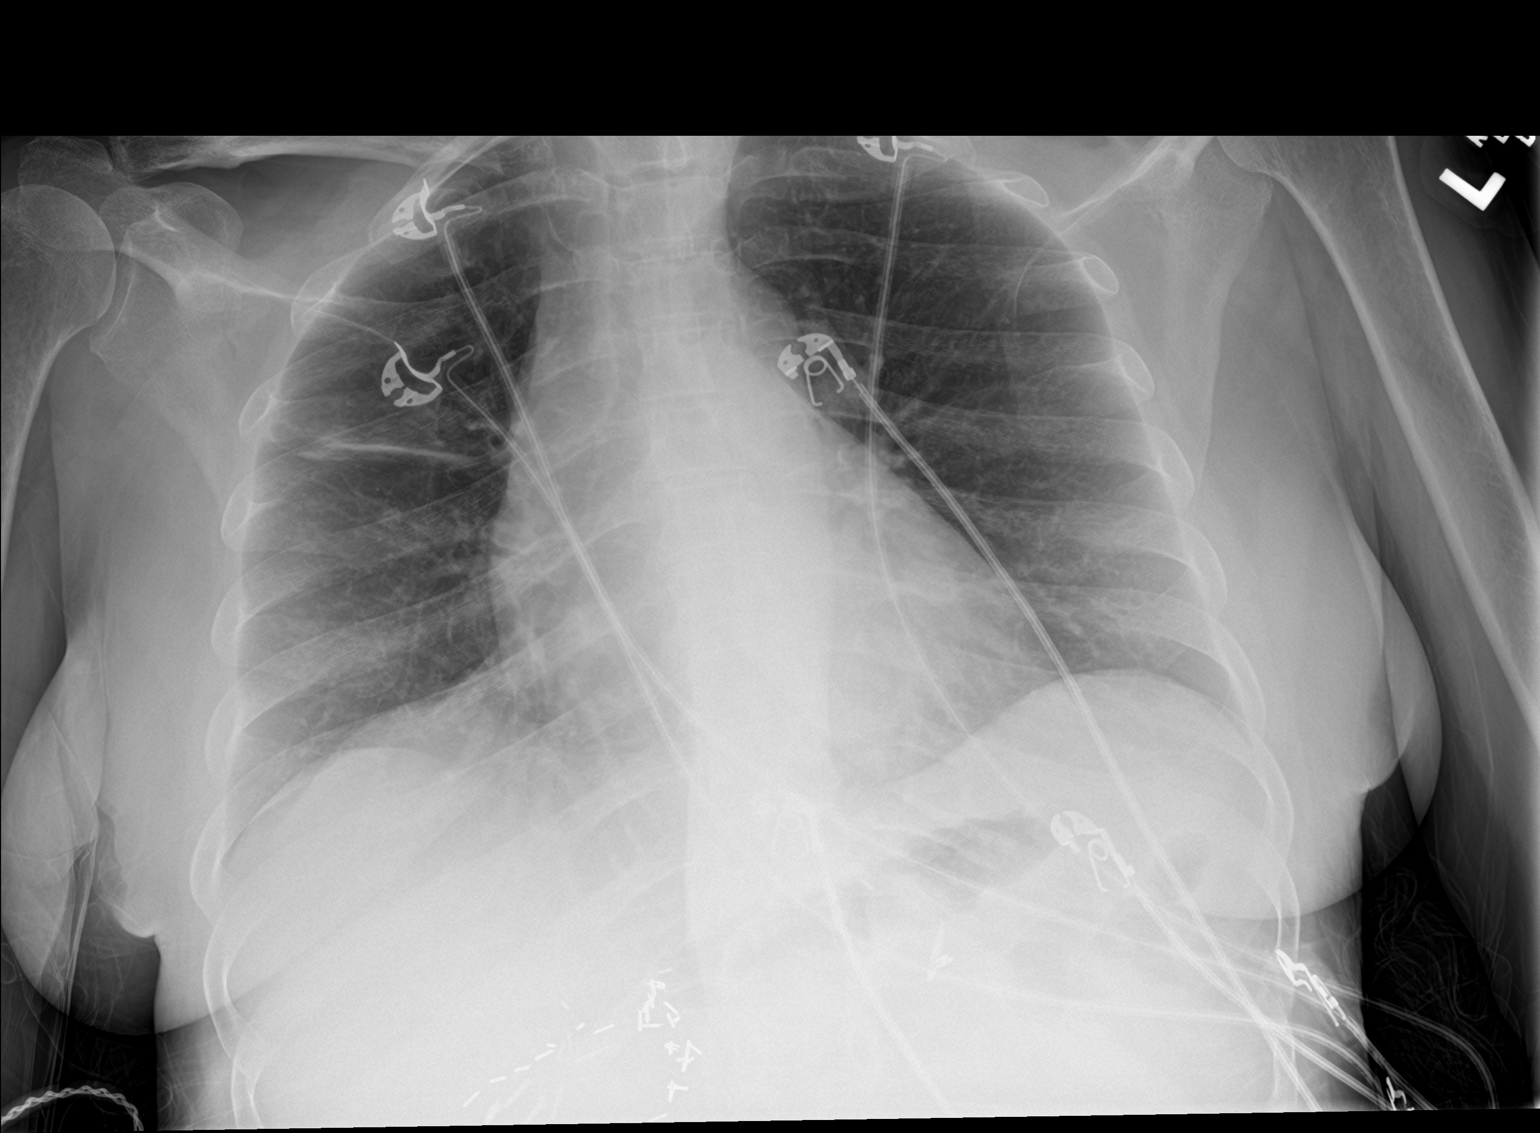

[abdomen erect]
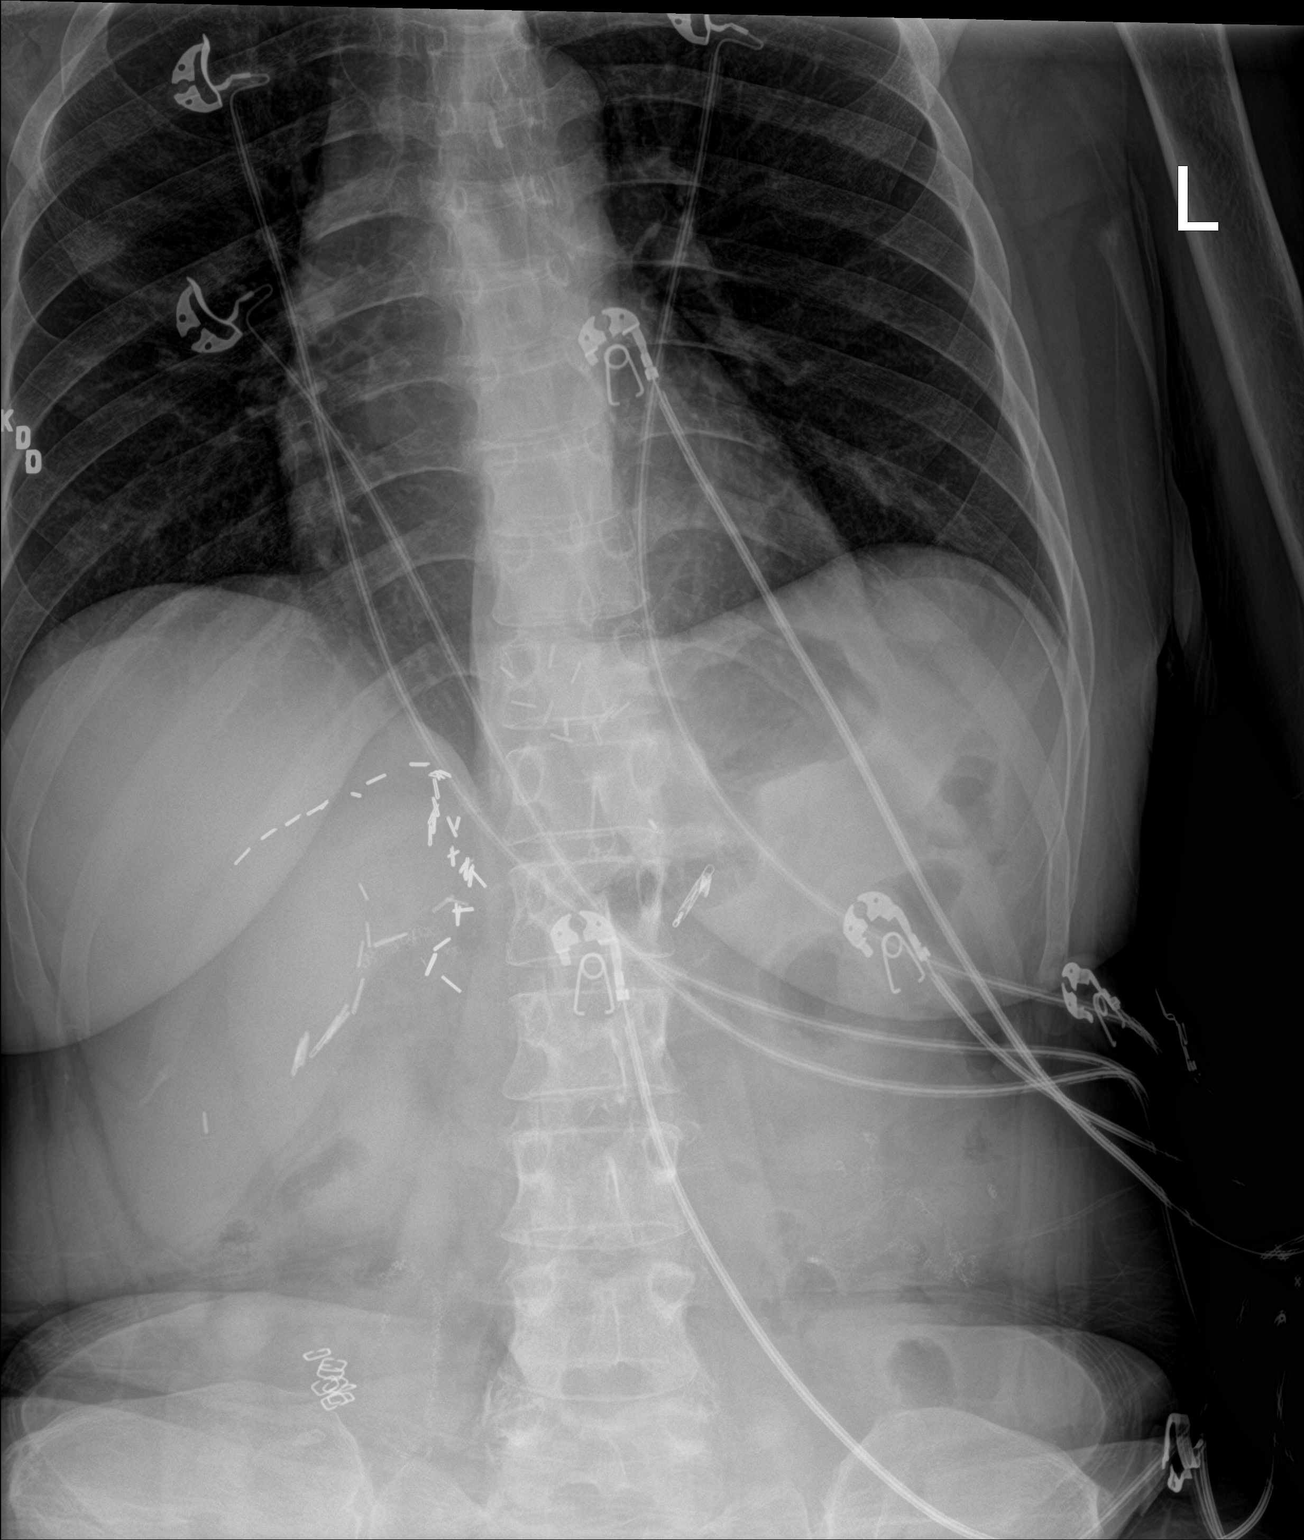

[abdomen supine]
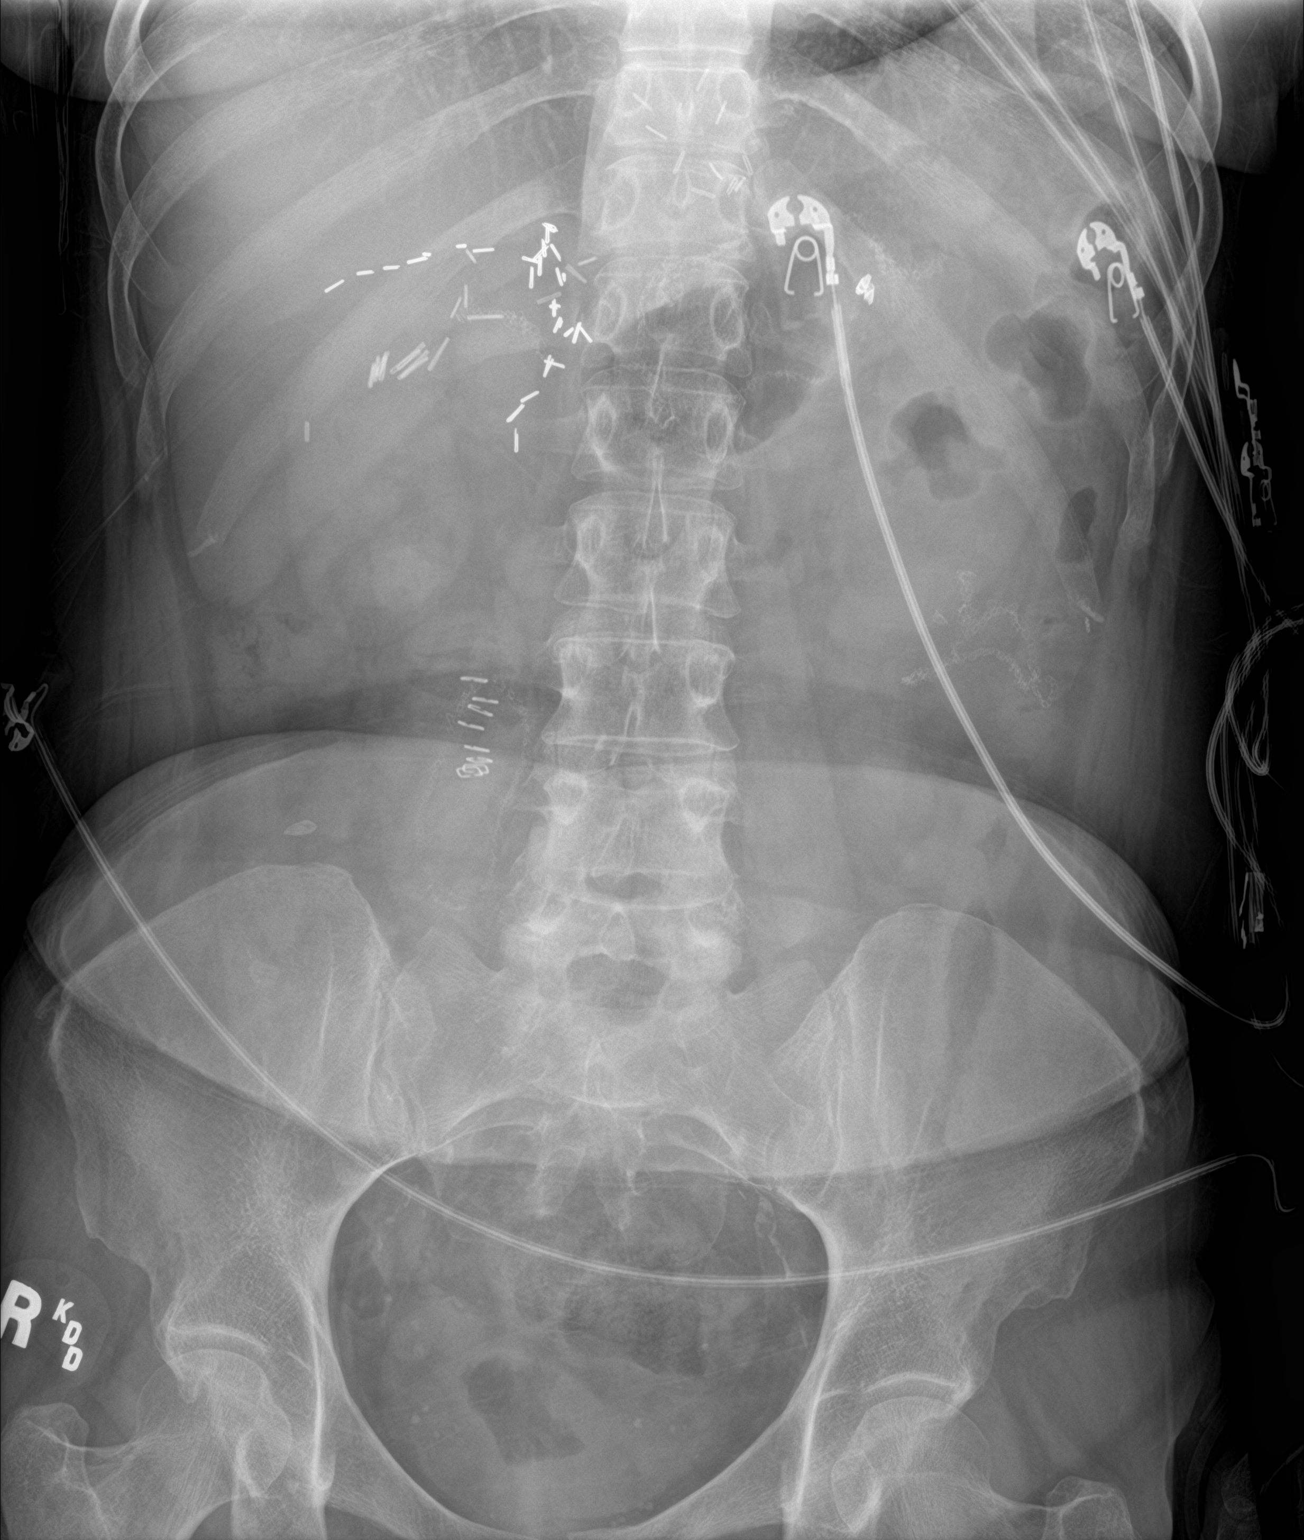

[3 of 3 positions shown; findings below may reference images not displayed]

FINDINGS: Single-view chest demonstrates linear scarring or atelectasis in the
right upper lung unchanged. Low lung volume. No consolidation.
Normal heart size. No pneumothorax.

Supine and upright views of the abdomen demonstrate no free air
beneath the diaphragm. Postsurgical changes in the right upper and
bilateral mid quadrants of the abdomen. Nonobstructed gas pattern
with diffuse decreased small bowel gas. Stable calcifications in the
right mid abdomen. Vascular calcifications. Calcified pelvic
phleboliths.
IMPRESSION: 1. No radiographic evidence for acute cardiopulmonary abnormality.
2. Nonobstructed gas pattern

## 2018-06-02 MED ORDER — SODIUM CHLORIDE 0.9 % IV BOLUS
1000.0000 mL | Freq: Once | INTRAVENOUS | Status: AC
  Administered 2018-06-02: 1000 mL via INTRAVENOUS

## 2018-06-02 MED ORDER — IOHEXOL 300 MG/ML  SOLN
100.0000 mL | Freq: Once | INTRAMUSCULAR | Status: AC | PRN
  Administered 2018-06-02: 100 mL via INTRAVENOUS

## 2018-06-02 MED ORDER — ONDANSETRON HCL 4 MG/2ML IJ SOLN
4.0000 mg | Freq: Once | INTRAMUSCULAR | Status: AC
  Administered 2018-06-02: 4 mg via INTRAVENOUS
  Filled 2018-06-02: qty 2

## 2018-06-02 NOTE — ED Triage Notes (Signed)
Patient complaining of abdominal pain x 1 week with vomiting and diarrhea x 3 days.

## 2018-06-02 NOTE — ED Provider Notes (Signed)
[facility] EMERGENCY DEPARTMENT Provider Note   CSN: [id number] Arrival date & time: 06/02/18  2053    History   Chief Complaint Chief Complaint  Patient presents with   Abdominal Pain    HPI Angel Price is a 53 y.o. female with a history as outlined below and most significant for chronic abdominal pain associated with multiple surgeries (Billroth II with revisions, total hysterectomy, adrenalectomy and cholecystectomy and a complicating abdominal wall abscess surgically drained last fall at [facility] and has also been seen by our local surgeons for this problem. She presents today with a 1 week hx of increased abdominal pain along with purulent drainage from a chronic sinus at a surgical incision at her umbilicus.  She denies fevers or chills and has had no increased abdominal distention. She does endorse n/v and non bloody diarrhea which started 3 days ago, last occurring early this morning.  She has been able to maintain fluid intake, but has had a reduced appetite.  She denies cough, sob, cp. She does endorse generalized weakness without lightheadedness. She has had no treatment prior to arrival for her symptoms. She has had no recent antibiotic use.     The history is provided by the patient.    Past Medical History:  Diagnosis Date   Abscess    soft tissue   Adrenal mass ([place])    Alcohol abuse    Anxiety    Blood transfusion without reported diagnosis    Chronic abdominal pain    Chronic wound infection of abdomen    Colon polyp    colonoscopy 04/2014   Depression    Diverticulosis    colonoscopy 04/2014 moderat pan colonic   Drug-seeking behavior    Gastritis    EGD 05/2014   Gastroparesis Nov 2015   GERD (gastroesophageal reflux disease)    Hemorrhoid    internal large   Hiatal hernia    History of Billroth II operation    Hypertension    Lung nodule    CT 02/2014 needs repeat 1 month   Lung nodule < 6cm on CT 04/25/2014   Lupus  (HCC)    Malingering    Nausea and vomiting    chronic, recurrent   Pancreatitis    Schatzki's ring    patent per EGD 04/2014   Sickle cell trait ([place])    Suicide attempt ([place])    Thyroid disease 2000   overactive, radiation    Patient Active Problem List   Diagnosis Date Noted   Hemorrhoidal skin tag    SBO (small bowel obstruction) (HCC)    Non-intractable vomiting with nausea    Folate deficiency 05/11/2017   Abdominal wall fistula    Cellulitis of abdominal wall 01/02/2017   Acute left hemiparesis ([place]) 12/05/2016   Malingering 12/05/2016   Weakness of left lower extremity 12/02/2016   CVA (cerebral vascular accident) ([place]) 11/23/2016   Bright red rectal bleeding 08/22/2016   Hypotension due to blood loss    Acute GI bleeding 05/24/2016   Dieulafoy lesion of duodenum    History of Billroth II operation    Gastrointestinal hemorrhage 05/22/2016   Absolute anemia    Acute pancreatitis 10/01/2015   Abnormal CT scan of lung 10/01/2015   Alcohol intoxication ([place])    Left-sided weakness    Psychosomatic factor in physical condition    Upper GI bleed    Diverticulosis of colon with hemorrhage    Chronic wound infection of abdomen 12/22/2014  Thyroid disease 12/22/2014   HTN (hypertension) 12/22/2014   Ataxia 11/01/2014   Hemorrhoids 04/26/2014   Lung nodule 04/26/2014   Diverticulosis    Gastritis    Hiatal hernia    Schatzki's ring    Acute blood loss anemia 04/25/2014   Sinus tachycardia 04/25/2014   Hypokalemia 04/25/2014   Hematemesis with nausea 04/25/2014   Lung nodule < 6cm on CT 04/25/2014   Intractable nausea and vomiting 04/24/2014   Gastroparesis    Abdominal pain 04/01/2014   Chronic abdominal pain 01/21/2014   Gastroenteritis 12/10/2013   Chronic abdominal wound infection 08/16/2013   MDD (major depressive disorder), recurrent episode, severe ([place]) 06/27/2013   Wrist laceration 06/24/2013    Diarrhea 05/07/2013   Rectal bleeding 05/07/2013   Abnormal LFTs 05/07/2013   Adrenal mass, left ([place]) 05/07/2013   Abdominal wall abscess at site of surgical wound 04/19/2013   Abdominal wall abscess 04/18/2013   Frequent headaches 03/30/2013   Sleep difficulties 03/30/2013   Essential hypertension, benign 03/30/2013   History of cocaine abuse ([place]) 03/16/2013   History of schizoaffective disorder 03/16/2013   Bipolar disorder ([place]) 03/16/2013   Personality disorder ([place]) 03/16/2013   Tobacco abuse 03/16/2013   Alcohol abuse 03/16/2013   Palpitations 03/16/2013   Poor vision 03/16/2013   History of gastric bypass 03/16/2013   Status post hysterectomy with oophorectomy 03/16/2013   Hypothyroid 03/16/2013   Lupus ([place]) 03/16/2013    Past Surgical History:  Procedure Laterality Date   ABDOMINAL HYSTERECTOMY  2013   [place]   ABDOMINAL SURGERY     ADRENALECTOMY Right    AGILE CAPSULE N/A 01/05/2015   Procedure: AGILE CAPSULE;  Surgeon: Daneil Dolin, MD;  Location: [facility] END[facility];  Service: Endoscopy;  Laterality: N/A;  0700   Billroth II procedure      [place], first 2000, 2005/2006.   BIOPSY  05/20/2013   Procedure: BIOPSIES OF ASCENDING AND SIGMOID COLON;  Surgeon: Daneil Dolin, MD;  Location: [facility] ORS;  Service: Endoscopy;;   BIOPSY  04/26/2014   Procedure: BIOPSIES;  Surgeon: Danie Binder, MD;  Location: [facility] ORS;  Service: Endoscopy;;   CHOLECYSTECTOMY     COLONOSCOPY     in [place]   COLONOSCOPY WITH PROPOFOL N/A 05/20/2013   Dr.Rourk- inadequate prep, normal appearing rectum, grossly normal colon aside from pancolonic diverticula, normal terminal ileum bx= unremarkable colonic mucosa. Due for early interval 2016.    COLONOSCOPY WITH PROPOFOL N/A 04/26/2014   LKT:GYBWLS ileum/one colon polyp removed/moderate pan-colonic diverticulosis/large internal hemorrhoids   COLONOSCOPY WITH PROPOFOL N/A 12/23/2014   Dr.Rourk- minimal internal  hemorrhoids, pancolonic diverticulosis   COLONOSCOPY WITH PROPOFOL N/A 08/23/2016   Procedure: COLONOSCOPY WITH PROPOFOL;  Surgeon: Danie Binder, MD;  Location: [facility];  Service: Endoscopy;  Laterality: N/A;   DEBRIDEMENT OF ABDOMINAL WALL ABSCESS N/A 02/08/2013   Procedure: DEBRIDEMENT OF ABDOMINAL WALL ABSCESS;  Surgeon: Jamesetta So, MD;  Location: [facility] ORS;  Service: General;  Laterality: N/A;   ESOPHAGOGASTRODUODENOSCOPY (EGD) WITH PROPOFOL N/A 05/20/2013   Dr.Rourk- s/p prior gastric surgery with normal esophagus, residual gastric mucosa and patent efferent limb   ESOPHAGOGASTRODUODENOSCOPY (EGD) WITH PROPOFOL N/A 02/03/2014   Dr. Gala Romney:  s/p hemigastrectomy with retained gastric contents. Residual gastric mucosa and efferent limb appeared normal otherwise. Query gastroparesis.    ESOPHAGOGASTRODUODENOSCOPY (EGD) WITH PROPOFOL N/A 04/26/2014   LHT:DSKAJGOT'L ring/small HH/mild non-erosive gasrtitis/normal anastomosis   ESOPHAGOGASTRODUODENOSCOPY (EGD) WITH PROPOFOL N/A 12/23/2014   Dr.Rourk- s/p prior hemigastrctomy, active  oozing from anastomotic suture site, hemostasis achieved   ESOPHAGOGASTRODUODENOSCOPY (EGD) WITH PROPOFOL N/A 05/23/2016   Dr. 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    ESOPHAGOGASTRODUODENOSCOPY (EGD) WITH PROPOFOL N/A 05/11/2017   Procedure: ESOPHAGOGASTRODUODENOSCOPY (EGD) WITH PROPOFOL;  Surgeon: Daneil Dolin, MD;  [facility];  Service: Endoscopy;  Laterality: N/A;   HEMORRHOID SURGERY N/A 06/18/2017   Procedure: THREE COLUMN EXTENSIVE HEMORRHOIDECTOMY;  Surgeon: Virl Cagey, MD;  Location: [facility];  Service: General;  Laterality: N/A;   INCISION AND DRAINAGE ABSCESS N/A 01/06/2017   Procedure: INCISION AND DRAINAGE ABDOMINAL WALL ABSCESS;  Surgeon: Aviva Signs, MD;  Location: [facility];  Service: General;  Laterality: N/A;   tendon repar Right     wrist   WOUND EXPLORATION Right 06/24/2013   Procedure: exploration of traumatic wound right wrist;  Surgeon: Tennis Must, MD;  Location: [place];  Service: Orthopedics;  Laterality: Right;     OB History    Gravida  4   Para  4   Term  4   Preterm      AB      Living  3     SAB      TAB      Ectopic      Multiple      Live Births               Home Medications    Prior to Admission medications   Medication Sig Start Date End Date Taking? Authorizing Provider  amLODipine (NORVASC) 10 MG tablet Take 0.5 tablets (5 mg total) by mouth daily. 01/03/17   Doreatha Lew, MD  atenolol (TENORMIN) 50 MG tablet Take 50 mg by mouth daily.    [provider]  docusate sodium (COLACE) 100 MG capsule Take 1 capsule (100 mg total) by mouth 2 (two) times daily. 06/18/17 06/18/18  Virl Cagey, MD  doxycycline (VIBRAMYCIN) 100 MG capsule Take 1 capsule (100 mg total) by mouth 2 (two) times daily. 06/03/18   Evalee Jefferson, PA-C  escitalopram (LEXAPRO) 20 MG tablet Take 20 mg daily by mouth.    [provider]  hydrocortisone (ANUSOL-HC) 2.5 % rectal cream Place rectally 2 (two) times daily. Patient not taking: Reported on 07/03/2017 05/23/17   Murlean Iba, MD  HYDROmorphone (DILAUDID) 2 MG tablet Take 1 tablet (2 mg total) by mouth every 6 (six) hours as needed for severe pain. 06/23/17 06/23/18  Aviva Signs, MD  lisinopril (PRINIVIL,ZESTRIL) 20 MG tablet Take 1 tablet (20 mg total) by mouth daily. 11/26/16 11/26/17  Reyne Dumas, MD  loperamide (IMODIUM) 2 MG capsule Take 1 capsule (2 mg total) by mouth 4 (four) times daily as needed for diarrhea or loose stools. 07/01/17   Noemi Chapel, MD  ondansetron (ZOFRAN ODT) 4 MG disintegrating tablet Take 1 tablet (4 mg total) by mouth every 8 (eight) hours as needed for nausea. 07/01/17   Noemi Chapel, MD  pantoprazole (PROTONIX) 40 MG tablet Take 1 tablet (40 mg total) by mouth 2 (two) times daily. 05/23/17 07/03/17   Murlean Iba, MD  promethazine (PHENERGAN) 25 MG suppository Place 1 suppository (25 mg total) rectally every 6 (six) hours as needed for nausea or vomiting. 11/10/17   Julianne Rice, MD  promethazine (PHENERGAN) 25 MG tablet Take 1 tablet (25 mg total) by mouth every 8 (eight) hours as needed for nausea. 10/01/17  Isla Pence, MD    Family History Family History  Problem Relation Age of Onset   Brain cancer Son    Schizophrenia Son    Cancer Son        brain   Lung cancer Father    Cancer Father        mets   Drug abuse Mother    Breast cancer Maternal Aunt    Bipolar disorder Maternal Aunt    Drug abuse Maternal Aunt    Colon cancer Maternal Grandmother        late 44s, early 88s   Drug abuse Sister    Drug abuse Brother    Bipolar disorder Paternal Grandfather    Bipolar disorder Cousin    Liver disease Neg Hx     Social History Social History   Tobacco Use   Smoking status: Current Every Day Smoker    Packs/day: 0.50    Years: 35.00    Pack years: 17.50    Types: Cigarettes   Smokeless tobacco: Never Used  Substance Use Topics   Alcohol use: Yes    Alcohol/week: 0.0 standard drinks    Comment: 2-3 cans every other day   Drug use: No    Types: Cocaine    Comment: quit 09/2012     Allergies   Codeine; Morphine and related; and Reglan [metoclopramide]   Review of Systems Review of Systems  Constitutional: Negative for chills and fever.  HENT: Negative for congestion and sore throat.   Eyes: Negative.   Respiratory: Negative for chest tightness and shortness of breath.   Cardiovascular: Negative for chest pain.  Gastrointestinal: Positive for abdominal pain, diarrhea, nausea and vomiting.  Genitourinary: Negative.   Musculoskeletal: Negative for arthralgias, joint swelling and neck pain.  Skin: Negative for color change, rash and wound.  Neurological: Negative for dizziness, weakness, light-headedness, numbness and  headaches.  Psychiatric/Behavioral: Negative.      Physical Exam Updated Vital Signs BP (!) 162/112 (BP Location: Left Wrist)    Pulse 90    Temp 98.1 F (36.7 C) (Oral)    Resp 18    Ht 5\' 3"  (1.6 m)    Wt 59.4 kg    SpO2 99%    BMI 23.21 kg/m   Physical Exam Vitals signs and nursing note reviewed.  Constitutional:      Appearance: She is well-developed.  HENT:     Head: Normocephalic and atraumatic.  Eyes:     General: No scleral icterus.    Conjunctiva/sclera: Conjunctivae normal.  Neck:     Musculoskeletal: Normal range of motion.  Cardiovascular:     Rate and Rhythm: Normal rate and regular rhythm.     Heart sounds: Normal heart sounds.  Pulmonary:     Effort: Pulmonary effort is normal.     Breath sounds: Normal breath sounds. No wheezing.  Abdominal:     General: Abdomen is protuberant. A surgical scar is present. Bowel sounds are normal.     Palpations: Abdomen is soft. There is no hepatomegaly.     Tenderness: There is abdominal tenderness in the periumbilical area.     Comments: Multiple incisional scarring of abdomen.  Chronic appearing sinus 9 oclock position 3 cm from umbilicus with trace purulent drainage.  No skin induration or erythema, no obvious fluctuance, no red streaking. Drainage is not exacerbated by palpation around the site.  Musculoskeletal: Normal range of motion.  Skin:    General: Skin is warm and dry.  Neurological:  Mental Status: She is alert.      ED Treatments / Results  Labs (all labs ordered are listed, but only abnormal results are displayed) Labs Reviewed  CBC WITH DIFFERENTIAL/PLATELET - Abnormal; Notable for the following components:      Result Value   RDW 17.5 (*)    Platelets 401 (*)    All other components within normal limits  BASIC METABOLIC PANEL - Abnormal; Notable for the following components:   Glucose, Bld 109 (*)    All other components within normal limits  URINALYSIS, ROUTINE W REFLEX MICROSCOPIC -  Abnormal; Notable for the following components:   Color, Urine STRAW (*)    Specific Gravity, Urine >1.046 (*)    All other components within normal limits  LIPASE, BLOOD  HEPATIC FUNCTION PANEL    EKG None  Radiology Ct Abdomen Pelvis W Contrast  Result Date: 06/03/2018 CLINICAL DATA:  Patient complaining of abdominal pain x 1 week with vomiting and diarrhea x 3 days. EXAM: CT ABDOMEN AND PELVIS WITH CONTRAST TECHNIQUE: Multidetector CT imaging of the abdomen and pelvis was performed using the standard protocol following bolus administration of intravenous contrast. CONTRAST:  168mL OMNIPAQUE IOHEXOL 300 MG/ML  SOLN COMPARISON:  CT, 11/10/2017 FINDINGS: Lower chest: No acute abnormality. Hepatobiliary: No focal liver abnormality is seen. Status post cholecystectomy. No biliary dilatation. Pancreas: Unremarkable. No pancreatic ductal dilatation or surrounding inflammatory changes. Spleen: Normal in size without focal abnormality. Adrenals/Urinary Tract: 2.1 cm left adrenal mass, stable with relative washout of greater than 50%, consistent with an adenoma. There are surgical vascular clips in the right suprarenal region with no defined adrenal gland consistent with a previous adrenalectomy. Kidneys normal size, orientation and position with no masses, stones or hydronephrosis. Symmetric enhancement and excretion. Normal ureters. Normal bladder. Stomach/Bowel: There are changes from prior gastric surgery and ligation of the duodenum and formation of a gastrojejunostomy, consistent with a Billroth 2 procedure. No wall thickening or adjacent inflammation along the stomach or proximal jejunum. Mild dilation of the small bowel anastomosis in the left mid abdomen, which is stable from the prior CT. No small bowel wall thickening or inflammation. Colon is normal in caliber. No colonic wall thickening or inflammation. Vascular/Lymphatic: Mild aortic atherosclerosis. No enlarged lymph nodes. Reproductive: Status  post hysterectomy. No adnexal masses. Other: Soft tissue attenuation is noted in the anterior abdominal wall at and surrounding the umbilicus, stable from the prior CT consistent with exuberant scarring. No hernia. No ascites. Musculoskeletal: No acute or significant osseous findings. IMPRESSION: 1. No acute findings within the abdomen or pelvis. 2. Chronic changes related to prior surgeries including a Billroth 2 procedure, right adrenalectomy and cholecystectomy and additional bowel anastomoses as well as a hysterectomy. Chronic anterior abdominal wall scarring. 3. Mild aortic atherosclerosis. Electronically Signed   By: Lajean Manes M.D.   On: 06/03/2018 00:08    Procedures Procedures (including critical care time)  Medications Ordered in ED Medications  sodium chloride 0.9 % bolus 1,000 mL (0 mLs Intravenous Stopped 06/03/18 0013)  ondansetron (ZOFRAN) injection 4 mg (4 mg Intravenous Given 06/02/18 2318)  iohexol (OMNIPAQUE) 300 MG/ML solution 100 mL (100 mLs Intravenous Contrast Given 06/02/18 2344)  fentaNYL (SUBLIMAZE) injection 50 mcg (50 mcg Intravenous Given 06/03/18 0013)  promethazine (PHENERGAN) injection 12.5 mg (12.5 mg Intravenous Given 06/03/18 0206)  HYDROmorphone (DILAUDID) injection 0.5 mg (0.5 mg Intravenous Given 06/03/18 0207)  doxycycline (VIBRA-TABS) tablet 100 mg (100 mg Oral Given 06/03/18 0206)     Initial Impression /  Assessment and Plan / ED Course  I have reviewed the triage vital signs and the nursing notes.  Pertinent labs & imaging results that were available during my care of the patient were reviewed by me and considered in my medical decision making (see chart for details).        Pt was given IV fluids, zofran and fentanyl. She endorsed no improvement in pain or nausea. Gave phenergan and dilaudid with improved sx.  She had no emesis or diarrhea while here.  Ct imaging and labs reviewed and discussed with pt. There is no sign of abscess at this time,  suspect infection is limited to sinus tract/ superficial abd wall.Marland Kitchen She was placed on doxycycline, advised warm compresses and f/u with her pcp and/or surgeon for re-eval for any worsened sx or if infection does not respond to abx. Discussed elevated bp and need for recheck within the week.  She was advised ov with pcp for this. Return precautions outlined.  Final Clinical Impressions(s) / ED Diagnoses   Final diagnoses:  Periumbilical abdominal pain  Skin infection  Essential hypertension    ED Discharge Orders         Ordered    doxycycline (VIBRAMYCIN) 100 MG capsule  2 times daily     06/03/18 0152           Evalee Jefferson, PA-C 06/03/18 1219    Ezequiel Essex, MD 06/03/18 [phone]

## 2018-06-03 LAB — URINALYSIS, ROUTINE W REFLEX MICROSCOPIC
BILIRUBIN URINE: NEGATIVE
Glucose, UA: NEGATIVE mg/dL
Hgb urine dipstick: NEGATIVE
Ketones, ur: NEGATIVE mg/dL
Leukocytes,Ua: NEGATIVE
NITRITE: NEGATIVE
Protein, ur: NEGATIVE mg/dL
pH: 6 (ref 5.0–8.0)

## 2018-06-03 MED ORDER — DOXYCYCLINE HYCLATE 100 MG PO TABS
100.0000 mg | ORAL_TABLET | Freq: Once | ORAL | Status: AC
  Administered 2018-06-03: 100 mg via ORAL
  Filled 2018-06-03: qty 1

## 2018-06-03 MED ORDER — HYDROMORPHONE HCL 1 MG/ML IJ SOLN
0.5000 mg | Freq: Once | INTRAMUSCULAR | Status: AC
  Administered 2018-06-03: 0.5 mg via INTRAVENOUS
  Filled 2018-06-03: qty 1

## 2018-06-03 MED ORDER — FENTANYL CITRATE (PF) 100 MCG/2ML IJ SOLN
50.0000 ug | Freq: Once | INTRAMUSCULAR | Status: AC
  Administered 2018-06-03: 50 ug via INTRAVENOUS
  Filled 2018-06-03: qty 2

## 2018-06-03 MED ORDER — PROMETHAZINE HCL 25 MG/ML IJ SOLN
12.5000 mg | Freq: Once | INTRAMUSCULAR | Status: AC
  Administered 2018-06-03: 12.5 mg via INTRAVENOUS
  Filled 2018-06-03: qty 1

## 2018-06-03 MED ORDER — DOXYCYCLINE HYCLATE 100 MG PO CAPS: 100.0000 mg | ORAL_CAPSULE | Freq: Two times a day (BID) | ORAL | 0 refills | Status: DC

## 2018-06-03 NOTE — Discharge Instructions (Addendum)
Your lab tests and CT scan are stable today with no return of the abdominal abscess you had last fall.  You are being treated for a superficial skin infection however.  Complete the entire course of the antibiotics prescribed.  Apply a warm wash cloth or heating pad over the site of this drainage for 15 minutes several times daily.  Call your primary doctor for a  recheck of your infection.  Also, your blood pressure is elevated tonight.  Make sure you are taking your blood pressure medications and plan to see your doctor within this next week for a recheck of your blood pressure.

## 2018-06-03 NOTE — ED Notes (Signed)
Patient using restroom to provide urine sample

## 2018-08-16 IMAGING — US US CAROTID DUPLEX BILAT
1 series · 13 of 24 positions shown · non-contrast
Comparison: None.

CLINICAL DATA: Weakness.  History of hypertension and smoking.

EXAM:
BILATERAL CAROTID DUPLEX ULTRASOUND
TECHNIQUE: Gray scale imaging, color Doppler and duplex ultrasound were
performed of bilateral carotid and vertebral arteries in the neck.

[Series 1: us carotid duplex bilat · 0.06mm/px · 13 of 73 slices shown]
[im 1/73]
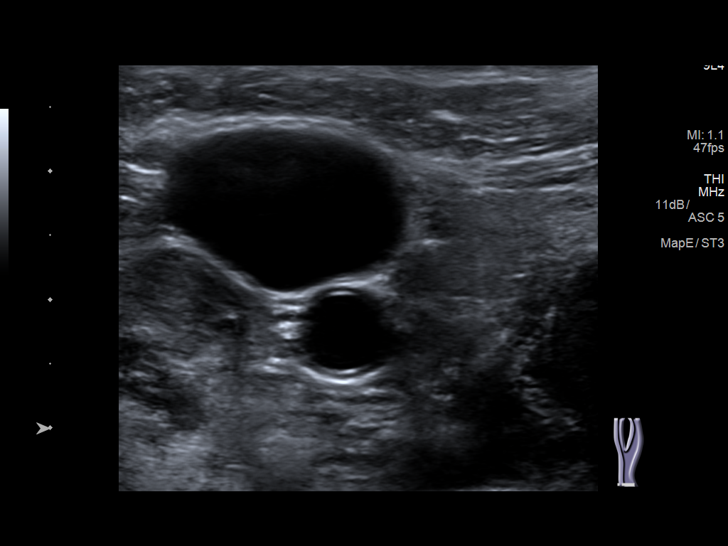
[im 7/73]
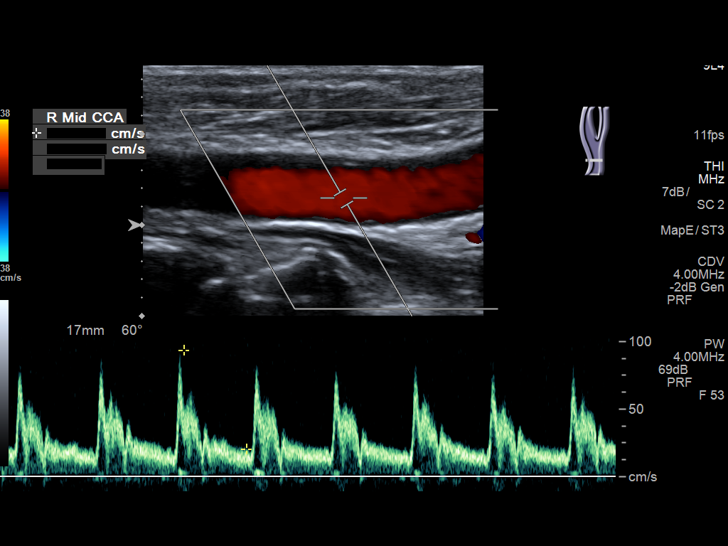
[im 13/73]
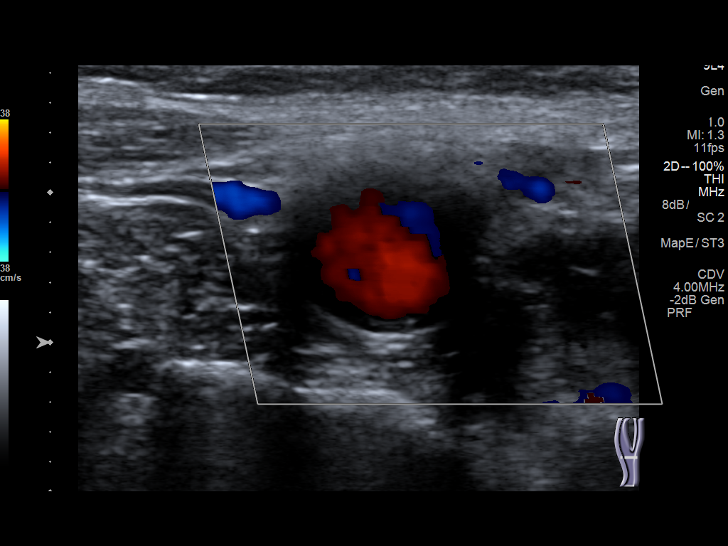
[im 19/73]
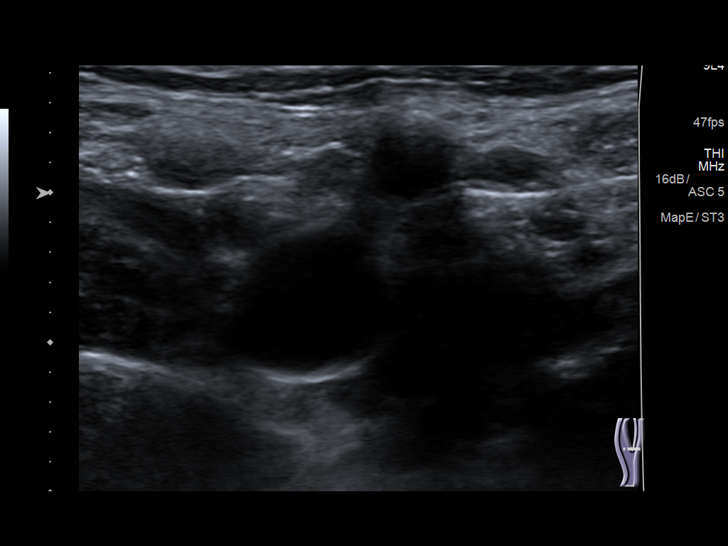
[im 26/73]
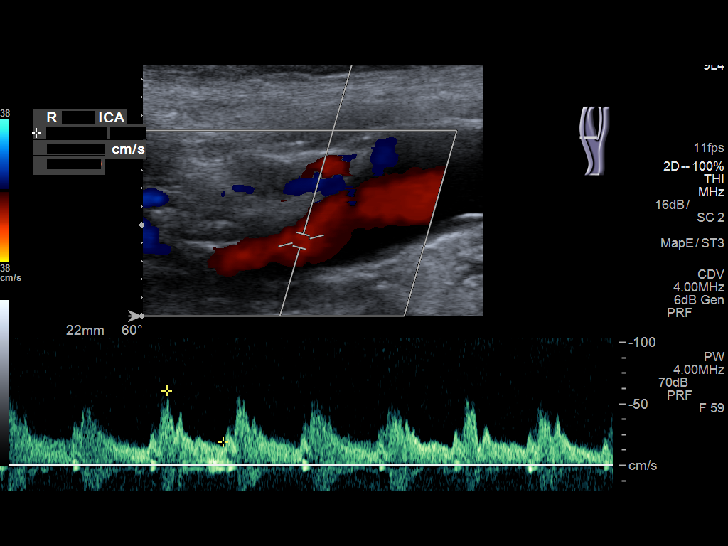
[im 32/73]
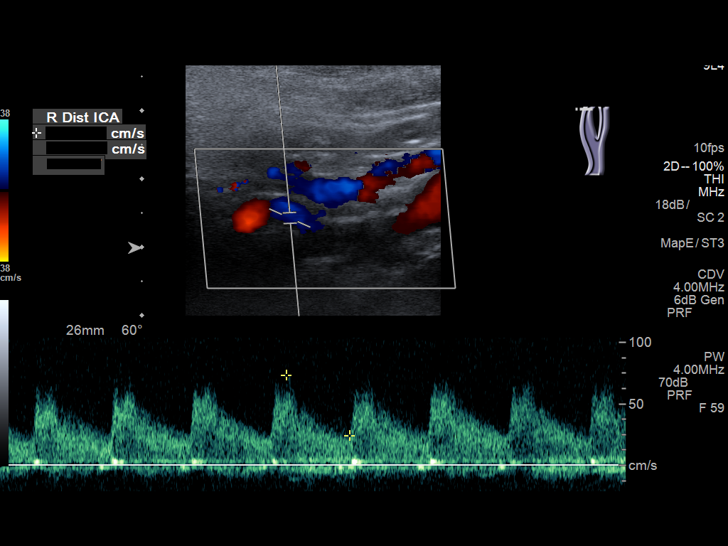
[im 38/73]
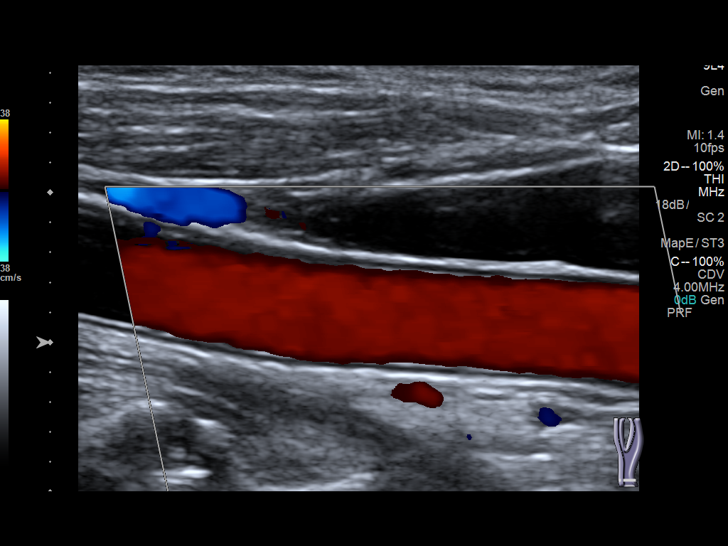
[im 41/73]
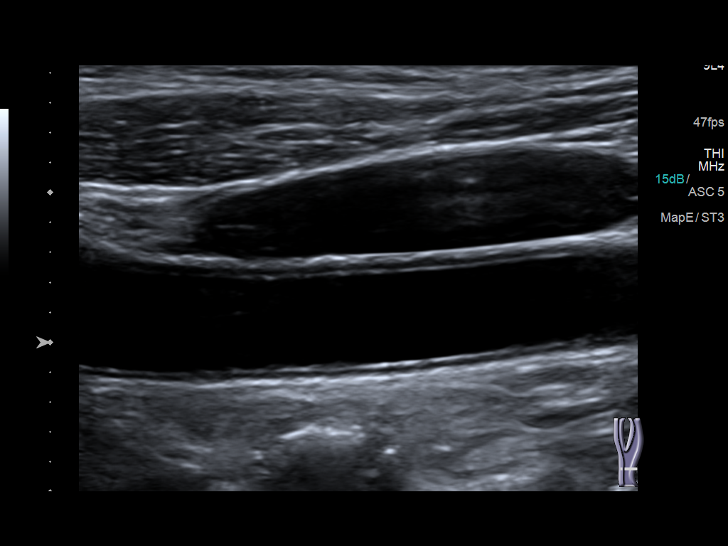
[im 47/73]
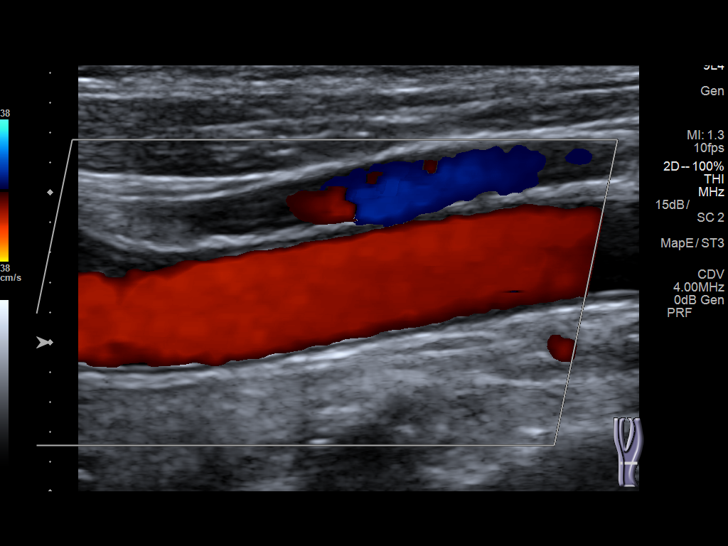
[im 54/73]
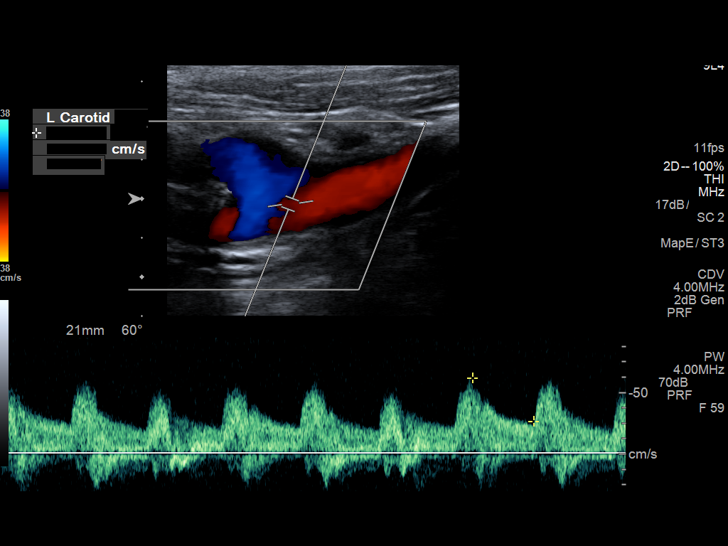
[im 60/73]
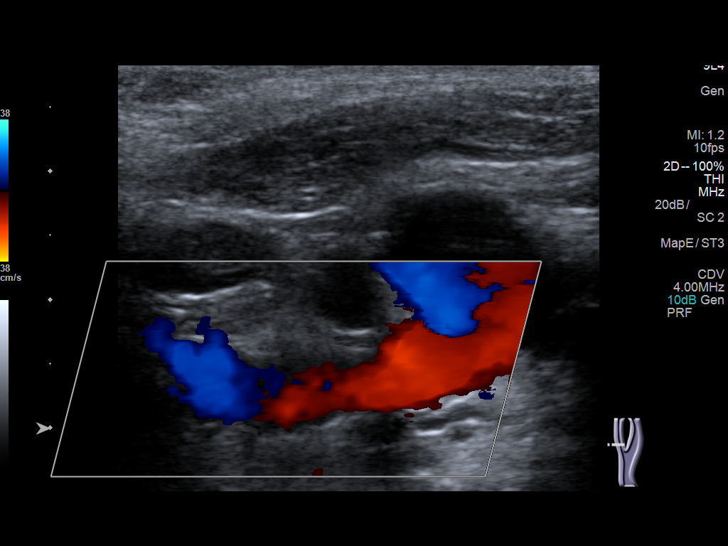
[im 66/73]
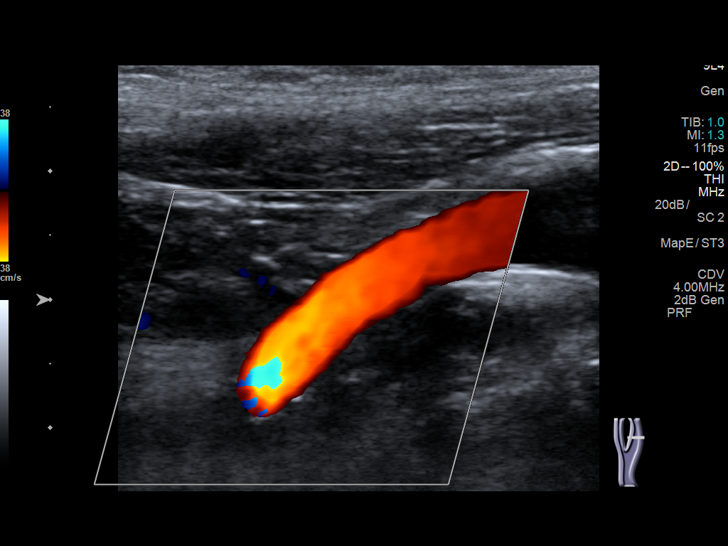
[im 73/73]
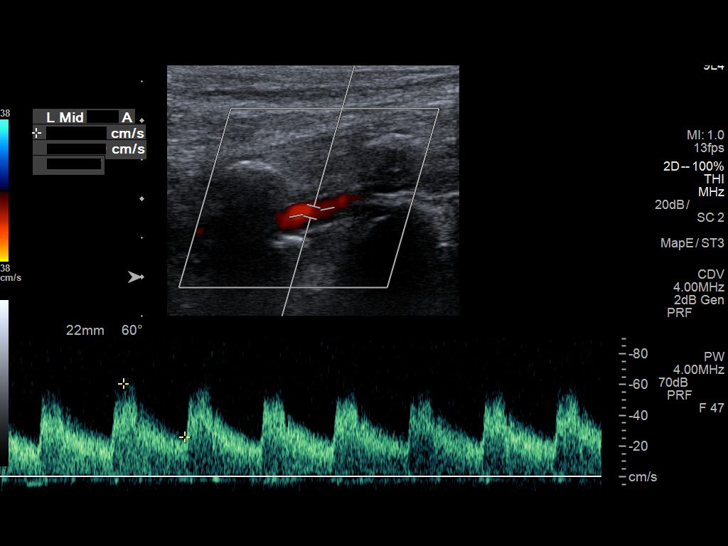

[13 of 24 positions shown; findings below may reference images not displayed]

FINDINGS: Criteria: Quantification of carotid stenosis is based on velocity
parameters that correlate the residual internal carotid diameter
with NASCET-based stenosis levels, using the diameter of the distal
internal carotid lumen as the denominator for stenosis measurement.

The following velocity measurements were obtained:

RIGHT

ICA:  74/24 cm/sec

CCA:  94/21 cm/sec

SYSTOLIC ICA/CCA RATIO:

DIASTOLIC ICA/CCA RATIO:

ECA:  86 cm/sec

LEFT

ICA:  112/48 cm/sec

CCA:  79/23 cm/sec

SYSTOLIC ICA/CCA RATIO:

DIASTOLIC ICA/CCA RATIO:

ECA:  73 cm/sec

RIGHT CAROTID ARTERY: There is no grayscale evidence of significant
intimal thickening or atherosclerotic plaque affecting interrogated
portions of the right carotid system. There are no elevated peak
systolic velocities within the interrogated course of the right
internal carotid artery to suggest a hemodynamically significant
stenosis.

RIGHT VERTEBRAL ARTERY:  Antegrade flow

LEFT CAROTID ARTERY: There is no grayscale evidence of significant
intimal thickening or atherosclerotic plaque affecting the
interrogated portions of the left carotid system. There are no
elevated peak systolic velocities within the interrogated course of
the left internal carotid artery to suggest a hemodynamically
significant stenosis.

LEFT VERTEBRAL ARTERY:  Antegrade Flow
IMPRESSION: Normal carotid Doppler ultrasound.
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
## 2018-09-15 IMAGING — CT CT ABD-PELV W/O CM
2 of 4 series · 15 of 46 positions shown, 17 images · non-contrast
Comparison: CT 01/04/2017, 01/02/2017, 11/25/2016, 09/23/2016,
05/22/2016, 04/25/2016

CLINICAL DATA: Rectal bleeding with cutaneous fistula to the right
of umbilicus

EXAM:
CT ABDOMEN AND PELVIS WITHOUT CONTRAST
TECHNIQUE: Multidetector CT imaging of the abdomen and pelvis was performed
following the standard protocol without IV contrast.

[Series 2: axial st · axial · 0.67mm/px · z∈[+1750,+2180]mm · 12 of 94 slices shown, 14 images]
[im 4/94  soft-tissue]
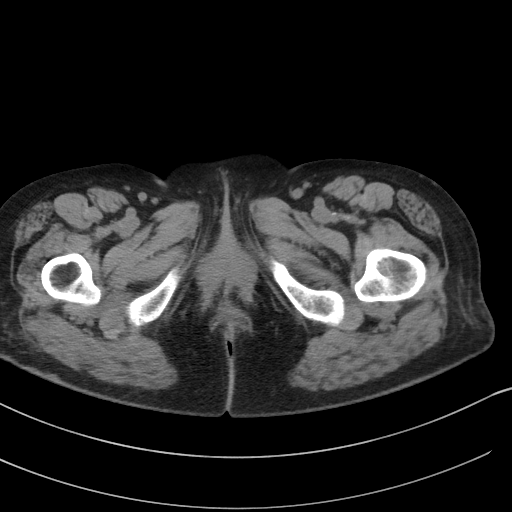
[im 4/94  bone]
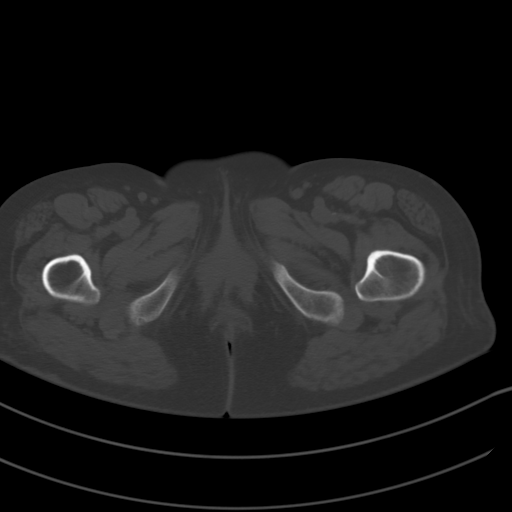
[im 12/94  soft-tissue]
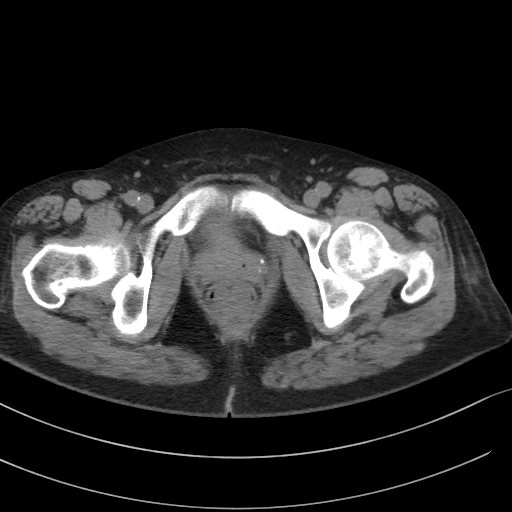
[im 20/94  soft-tissue]
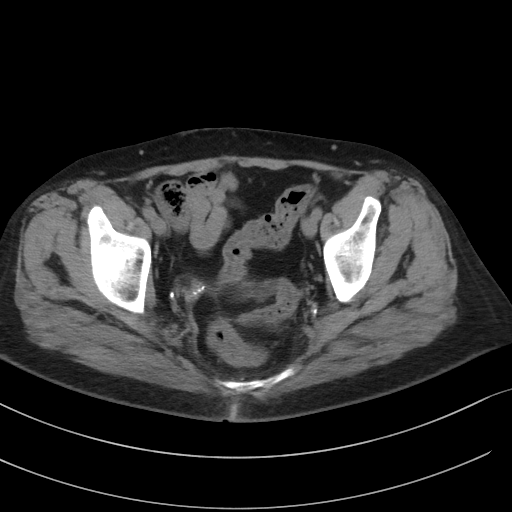
[im 28/94  soft-tissue]
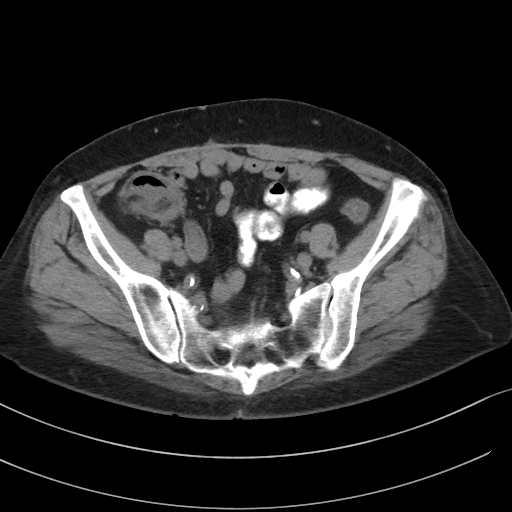
[im 35/94  soft-tissue]
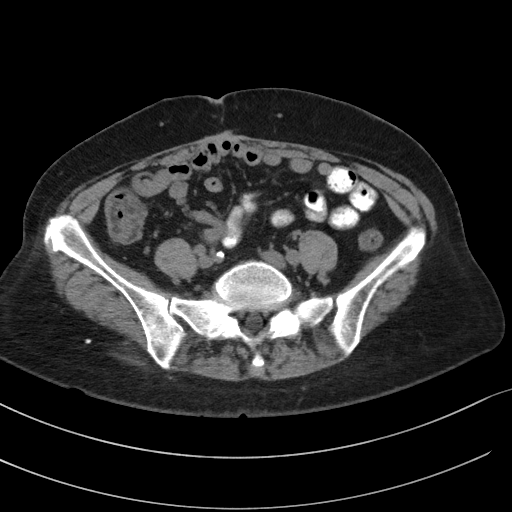
[im 43/94  soft-tissue]
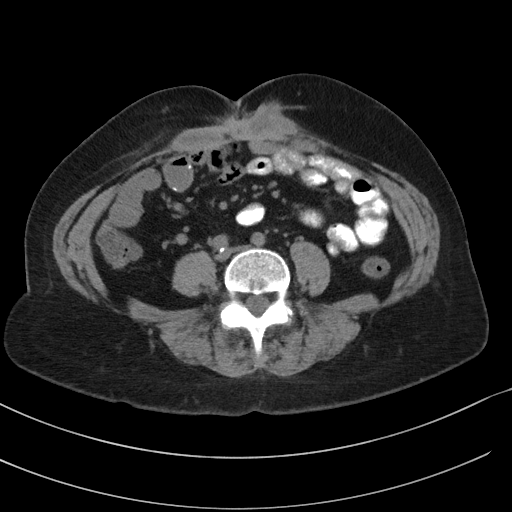
[im 51/94  soft-tissue]
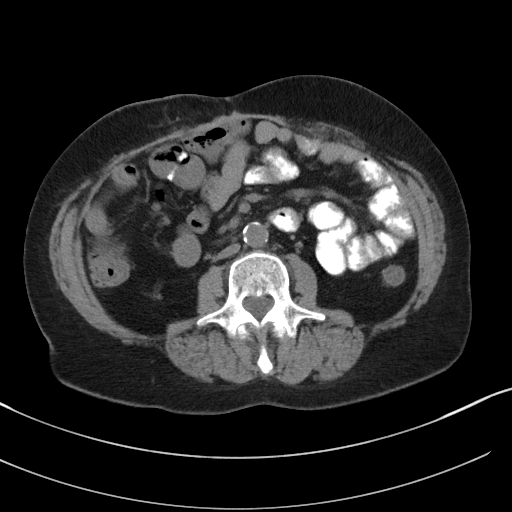
[im 59/94  soft-tissue]
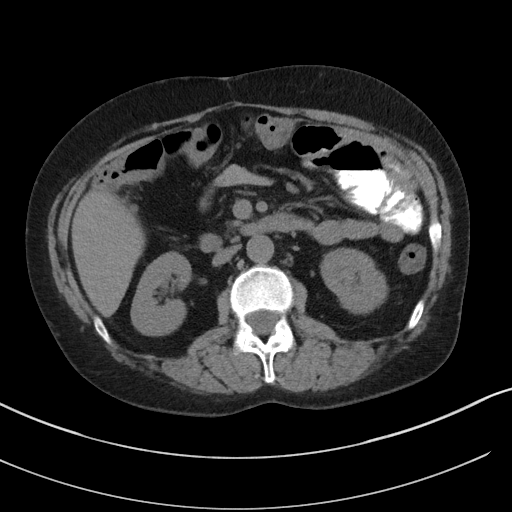
[im 66/94  soft-tissue]
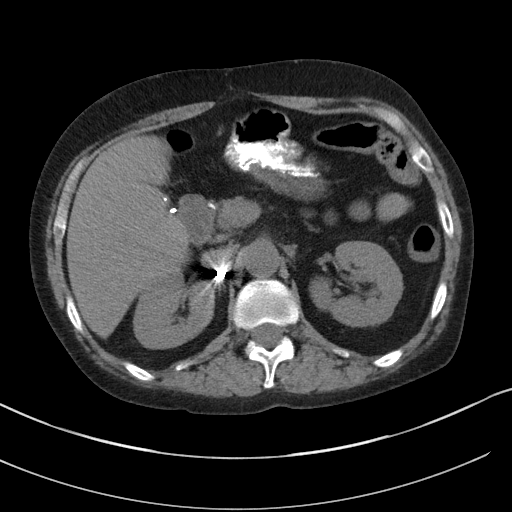
[im 66/94  bone]
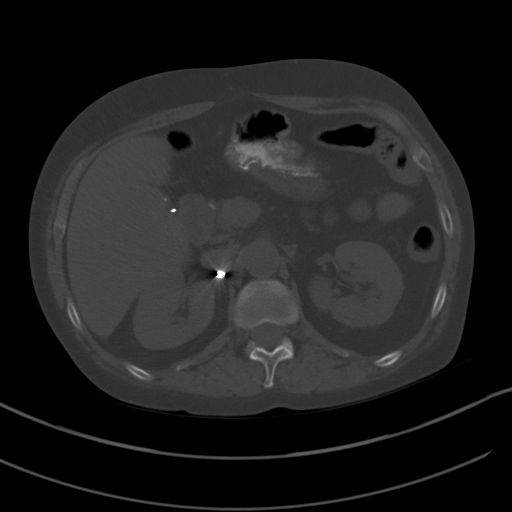
[im 74/94  soft-tissue]
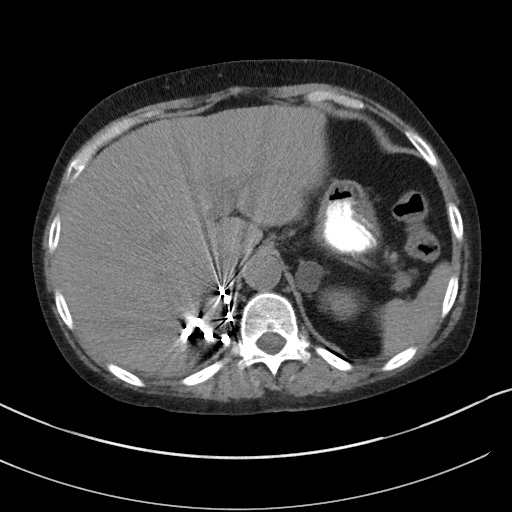
[im 82/94  soft-tissue]
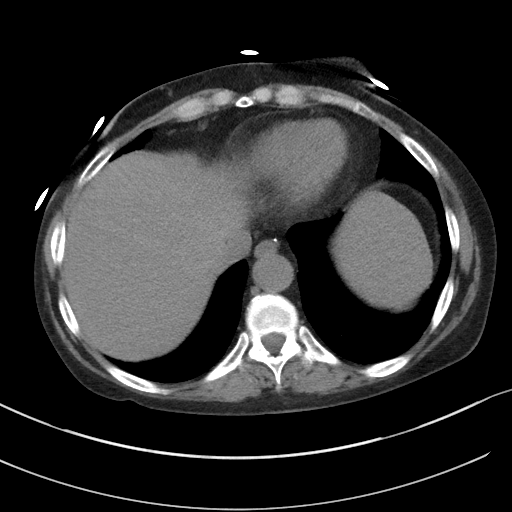
[im 90/94  soft-tissue]
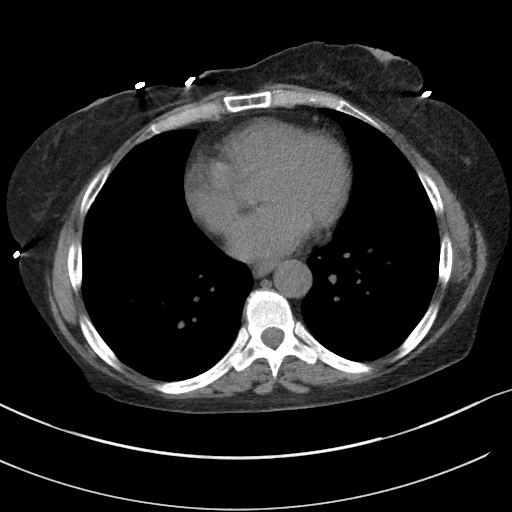

[Series 5: coronal st · coronal · 0.75mm/px · 3 of 98 slices shown]
[im 33/98  soft-tissue]
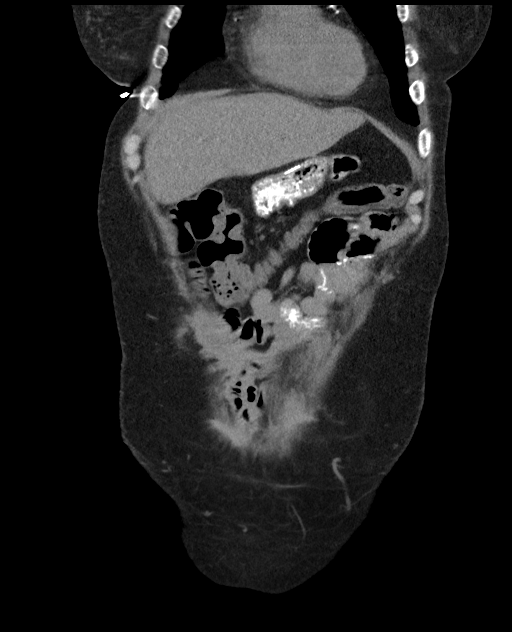
[im 44/98  soft-tissue]
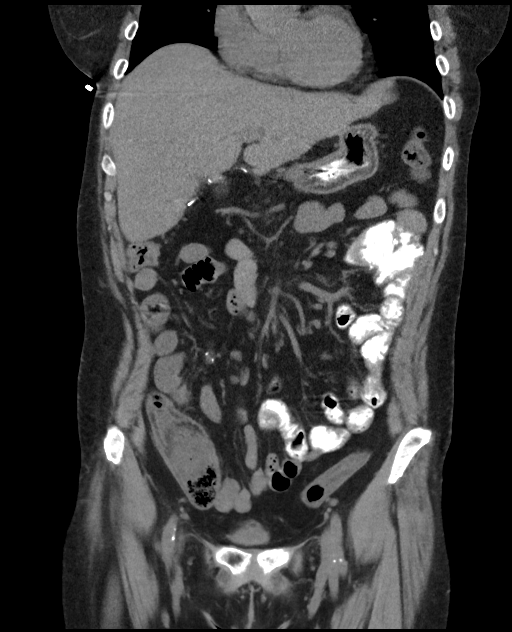
[im 54/98  soft-tissue]
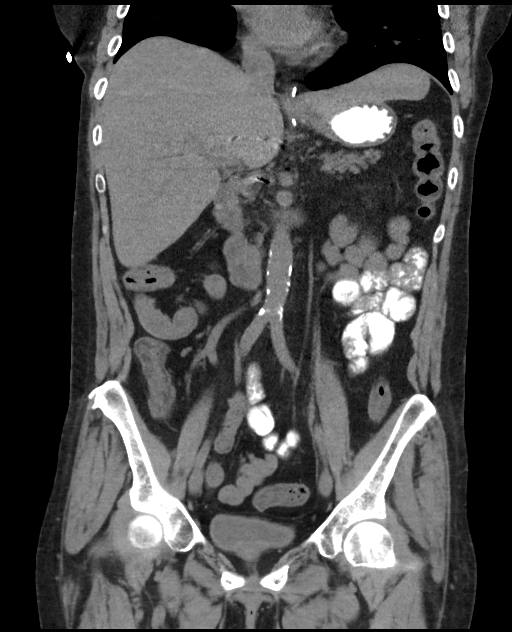

[15 of 46 positions shown; findings below may reference images not displayed]

FINDINGS: Lower chest: Lung bases demonstrate no acute consolidation or
pleural effusion. Normal heart size. Coronary vascular
calcification. Surgical clips around the GE junction

Hepatobiliary: No focal hepatic abnormality. Surgical clips at the
gallbladder fossa. Stable enlarged extrahepatic bile duct.

Pancreas: Unremarkable. No pancreatic ductal dilatation or
surrounding inflammatory changes.

Spleen: Normal in size without focal abnormality.

Adrenals/Urinary Tract: Stable 2 cm left adrenal gland mass. The
right adrenal gland is not seen. Kidneys show no hydronephrosis. The
bladder is unremarkable

Stomach/Bowel: The stomach is nonenlarged. Postsurgical changes of
the stomach. No dilated small bowel. Surgical suture in the right
lower quadrant small bowel with mild wall thickening here. Diffuse
collapsed appearance of the colon. No rectal soft tissue thickening
or surrounding inflammation.

Vascular/Lymphatic: Moderate aortic atherosclerosis. No aneurysmal
dilatation. No significantly enlarged lymph nodes

Reproductive: Status post hysterectomy. No adnexal masses.

Other: Negative for free air or free fluid. Irregular thick-walled
rim enhancing fluid collection within the deep subcutaneous soft
tissues of the midline abdominal wall abutting the rectus sheath and
musculature. This is contiguous with linear tracts leading to the
skin surface and are consistent with fistulous. Decreased
phlegmonous change in the right subcutaneous soft tissues near the
umbilical region but now with tract leading to the skin consistent
with fistula.

Musculoskeletal: No acute or significant osseous findings.
IMPRESSION: 1. No CT evidence for acute intra-abdominal or pelvic abnormality.
Extensive postsurgical changes. No rectal wall thickening or
perirectal inflammation
2. Chronic inflammatory process within the periumbilical region of
the ventral abdominal wall with multiple cutaneous fistulous, the
more inferior of which appear chronic. A fistula has developed in
the region of previously noted right phlegmonous change to the right
of the umbilicus.

## 2018-09-19 IMAGING — DX DG CHEST 2V
2 series · 2 of 2 positions shown · non-contrast
Comparison: 04/26/2017 and 01/04/2017

CLINICAL DATA: Cough with hemoptysis. Chest pain and shortness of
breath.

EXAM:
CHEST  2 VIEW

[chest pa]
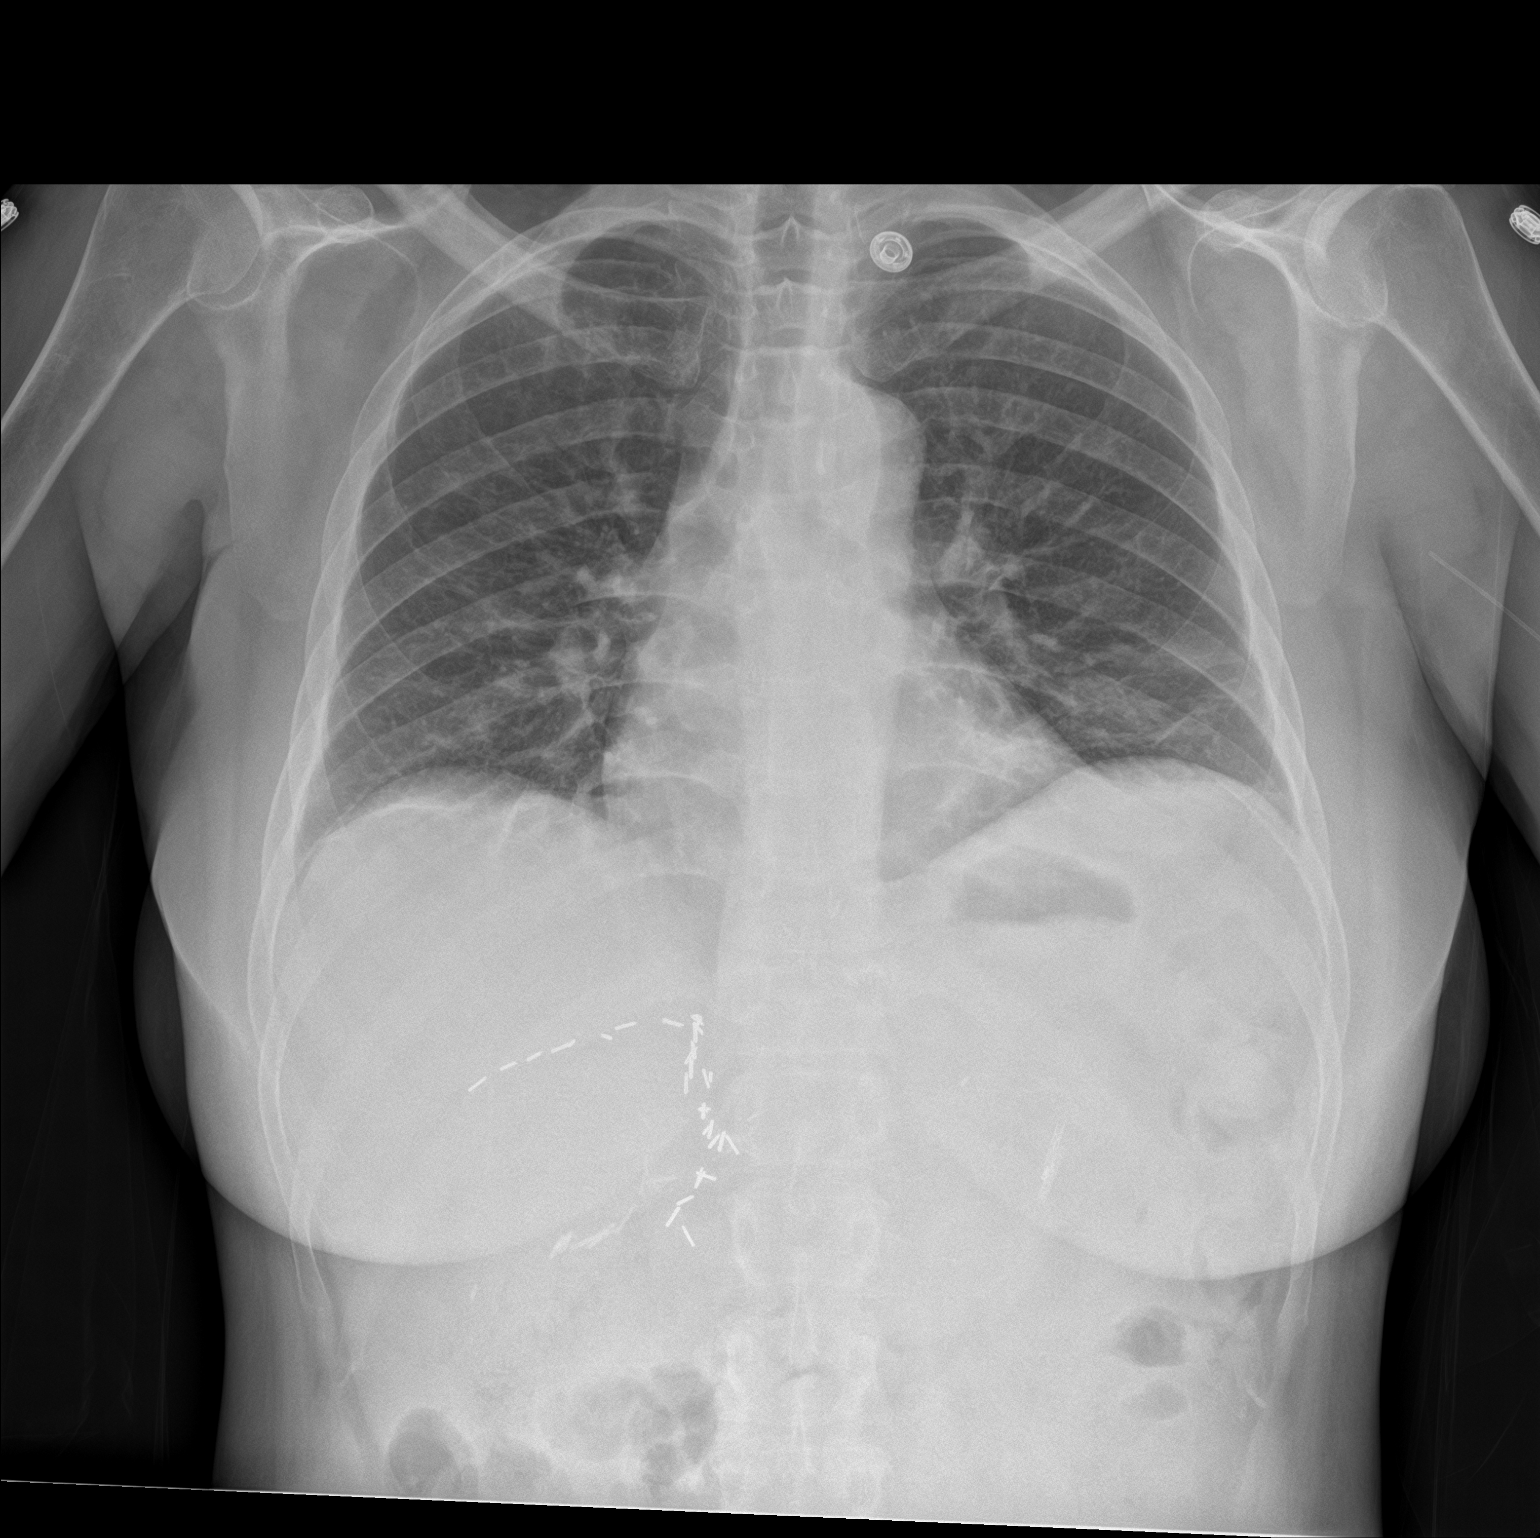

[chest lat]
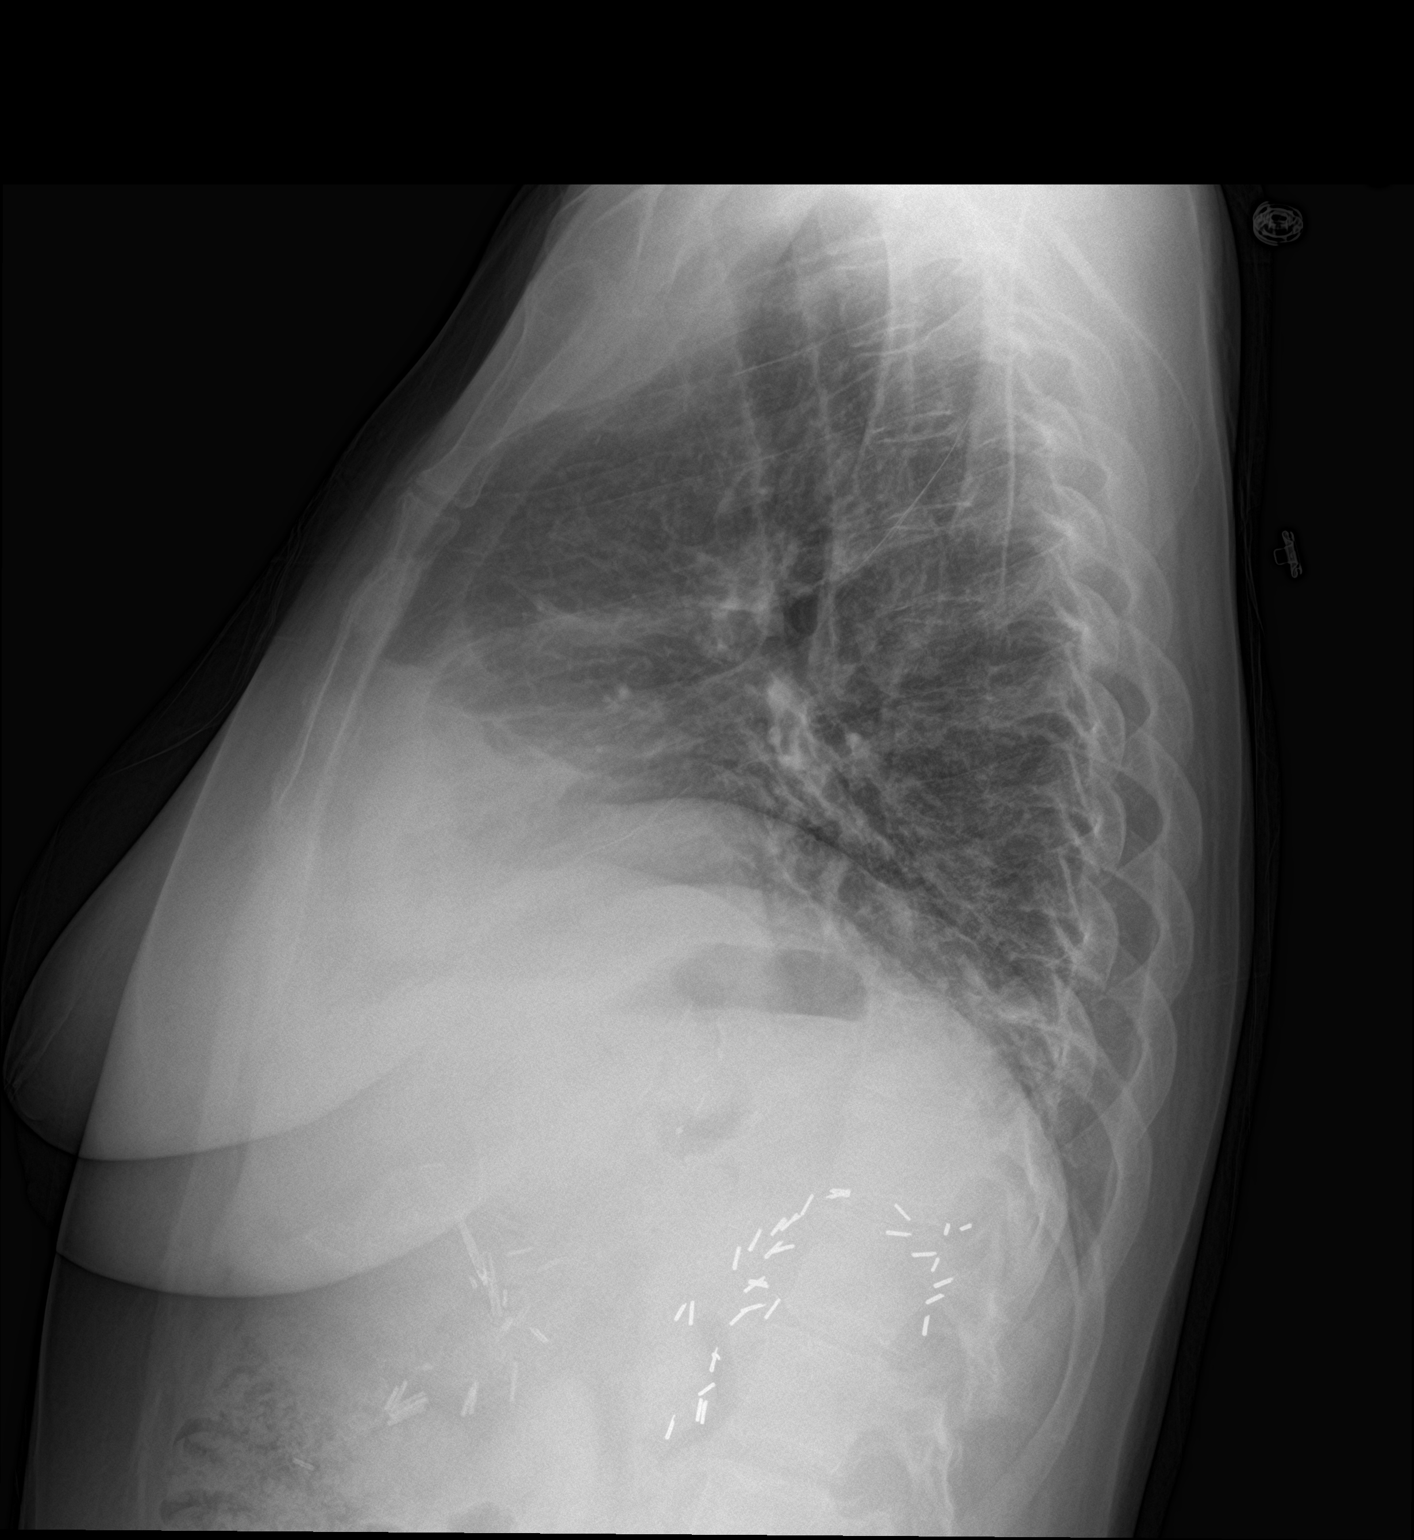

[2 of 2 positions shown; findings below may reference images not displayed]

FINDINGS: There is new slight bibasilar atelectasis. Heart size and
vascularity are normal. No effusions. No bone abnormality. Multiple
surgical clips in the abdomen.
IMPRESSION: New bibasilar atelectasis.

## 2018-09-21 IMAGING — CR DG HIP (WITH OR WITHOUT PELVIS) 1V PORT*L*
1 series · 1 of 1 positions shown · non-contrast
Comparison: Pelvis and left hip radiograph 11/01/2016.

CLINICAL DATA: 51-year-old female with history of knot on the
lateral aspect of the left hip.

EXAM:
DG HIP (WITH OR WITHOUT PELVIS) 1V PORT LEFT

[ap]
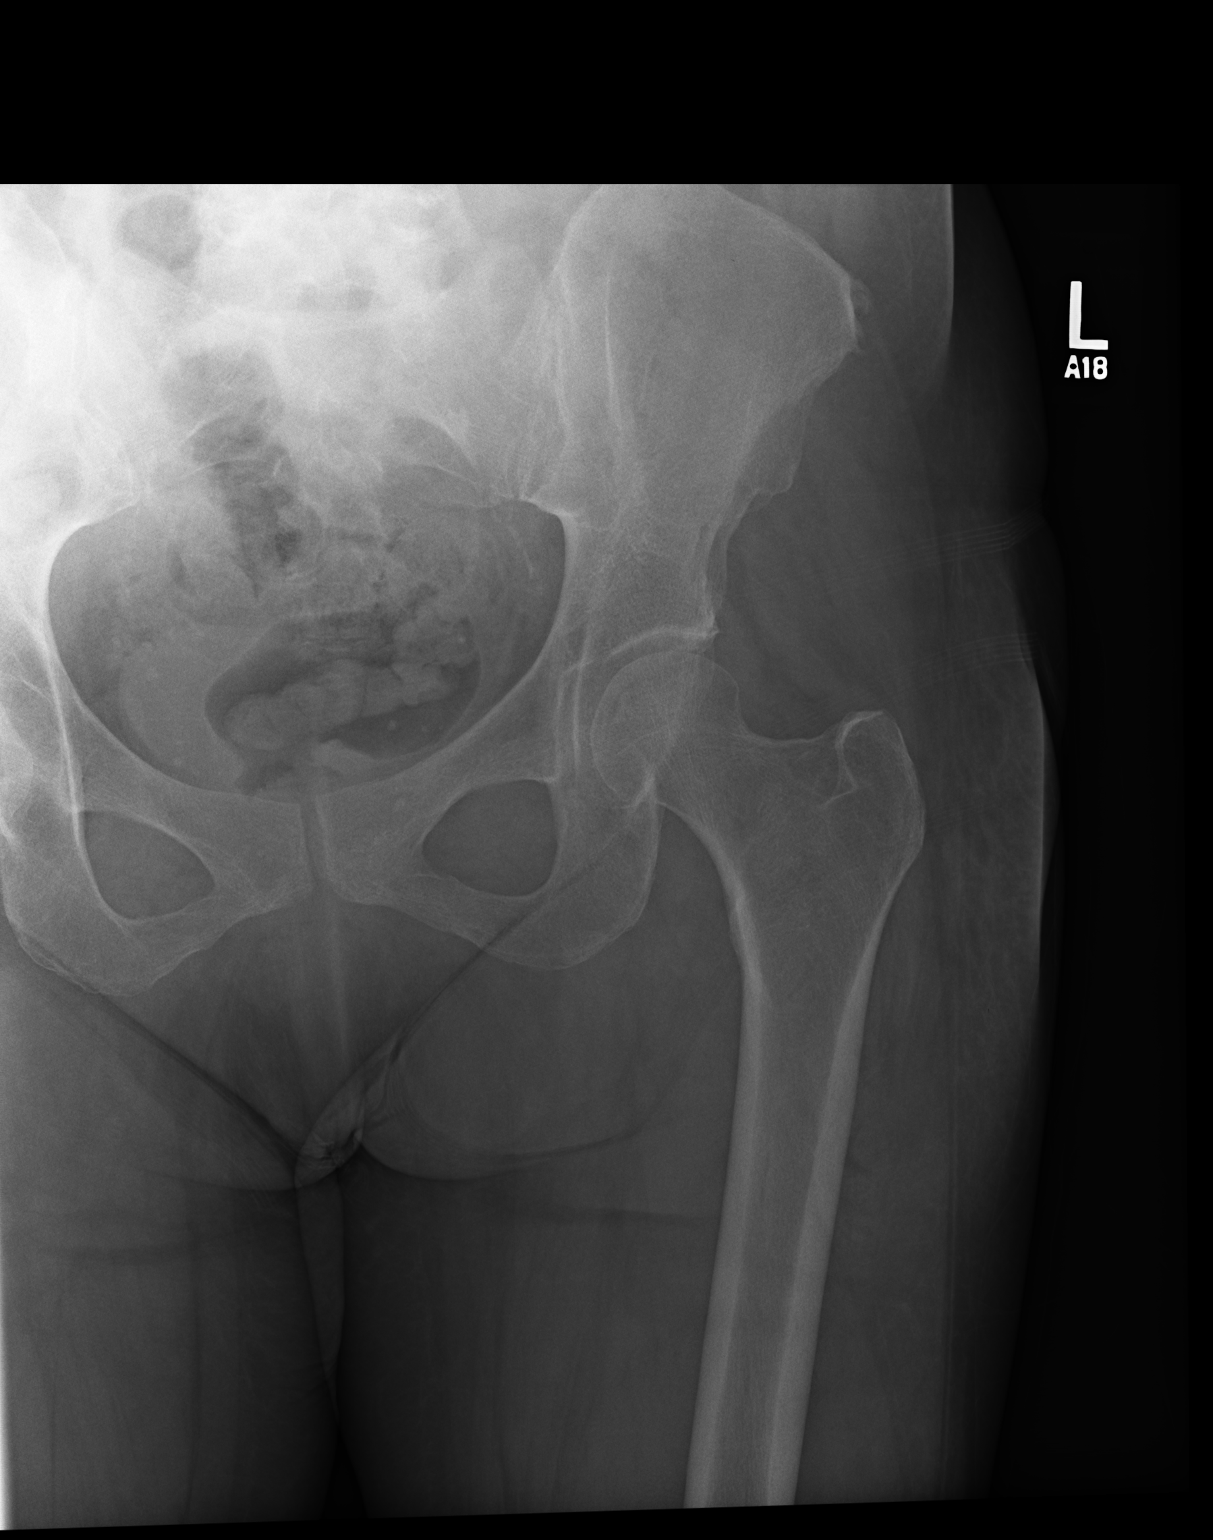

[1 of 1 positions shown; findings below may reference images not displayed]

FINDINGS: There is no evidence of hip fracture or dislocation. There is no
evidence of arthropathy or other focal bone abnormality.
IMPRESSION: Negative.

## 2018-09-22 IMAGING — CR DG CHEST 1V PORT
1 series · 2 of 2 positions shown · non-contrast
Comparison: 05/14/2017

CLINICAL DATA: Nasogastric tube placement

EXAM:
PORTABLE CHEST 1 VIEW

[Series 1: portable · 0.17mm/px · 2 of 2 slices shown]
[im 1/2]
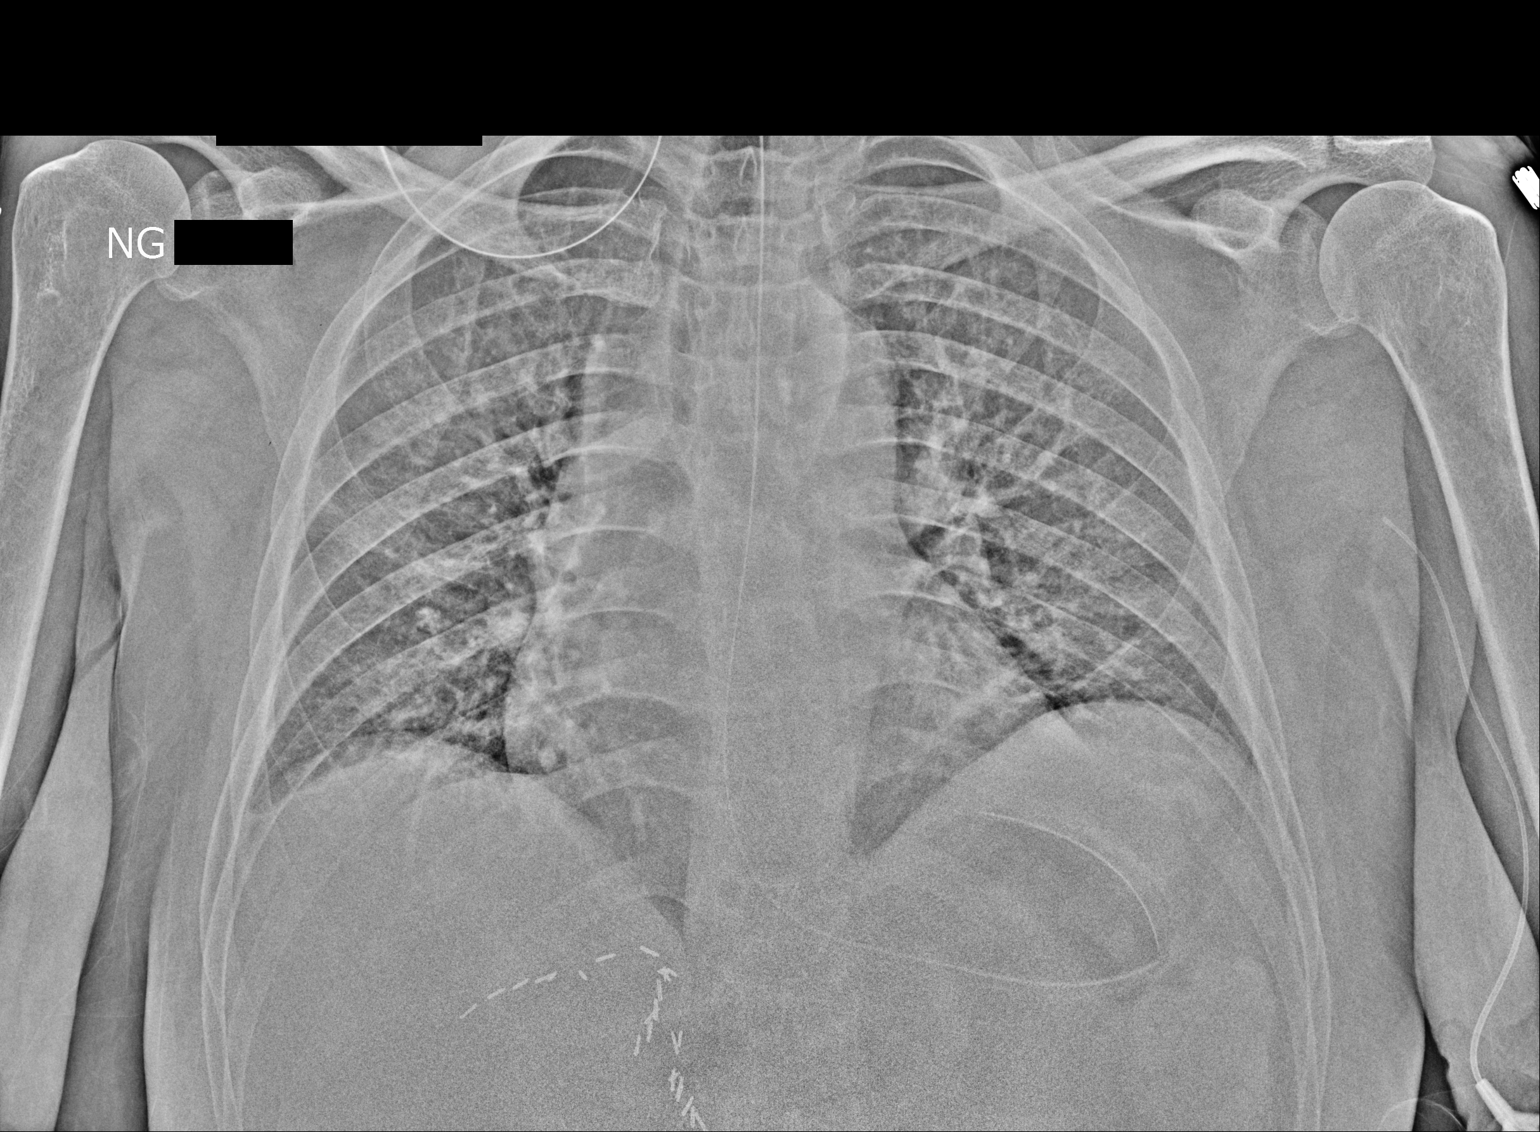
[im 2/2]
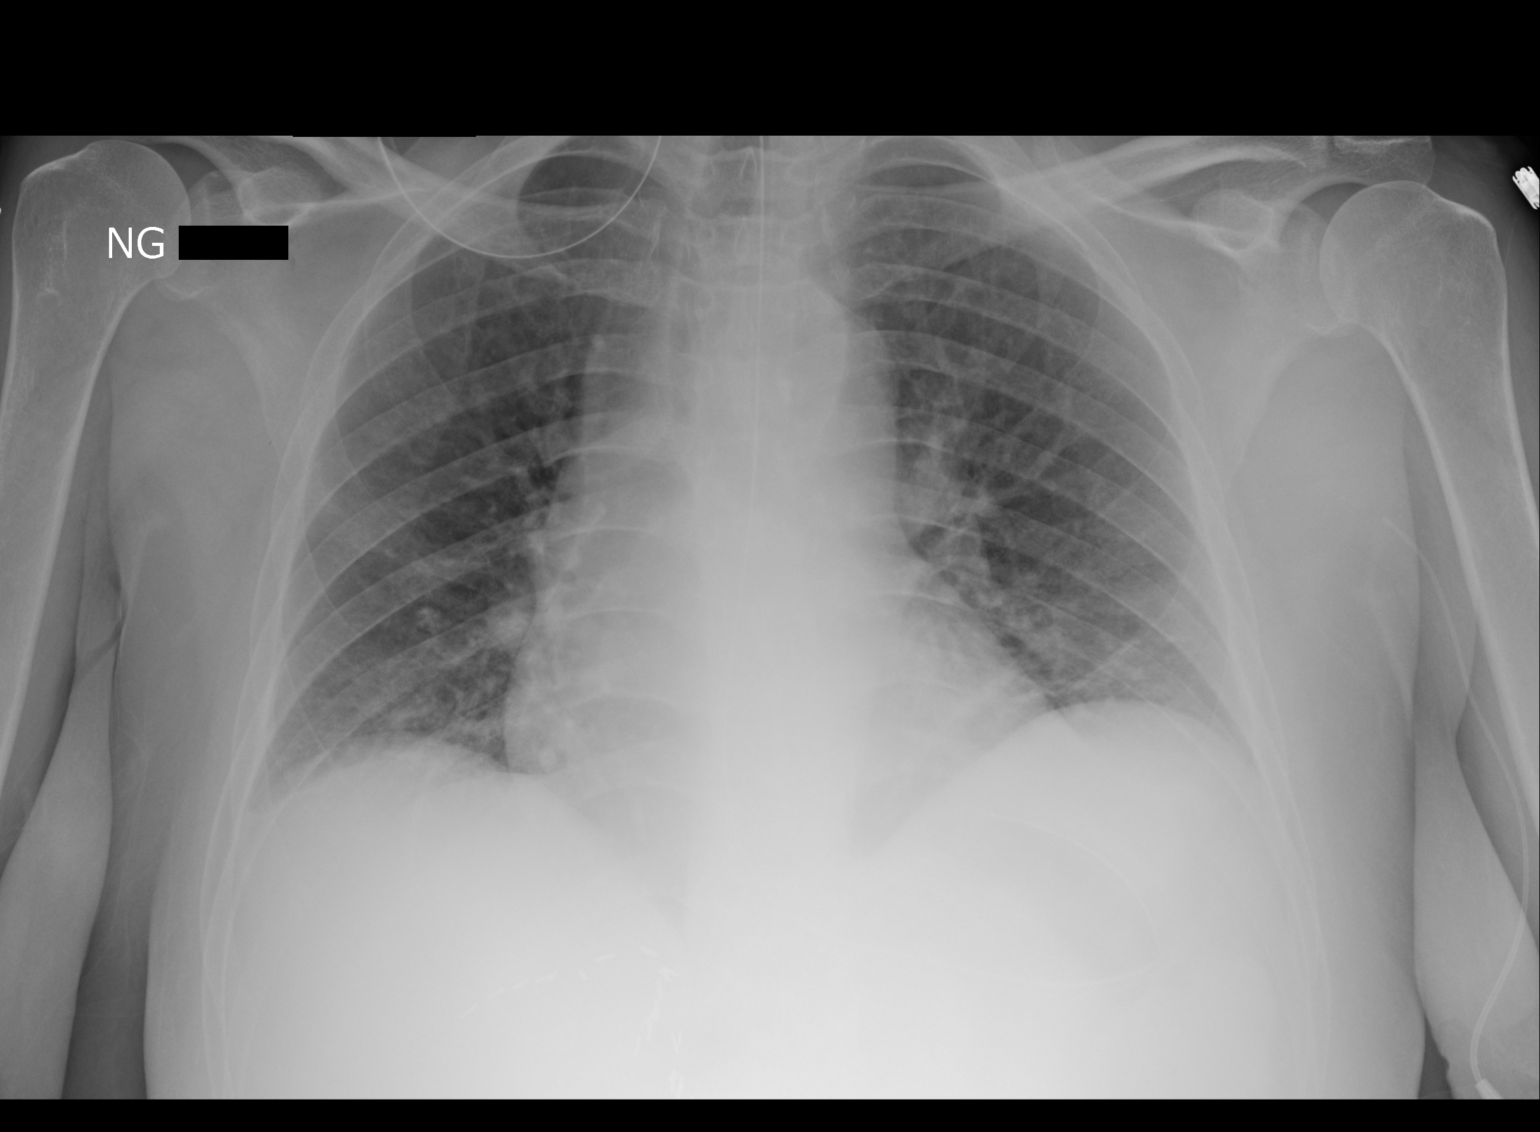

[2 of 2 positions shown; findings below may reference images not displayed]

FINDINGS: There is a nasogastric tube coiled in the fundus of the stomach.
There is no focal consolidation. There is no pleural effusion or
pneumothorax. The heart and mediastinal contours are unremarkable.

The osseous structures are unremarkable.
IMPRESSION: Nasogastric tube coiled in the fundus of the stomach.

## 2018-09-24 NOTE — Progress Notes (Signed)
This encounter was created in error - please disregard.

## 2018-10-08 NOTE — Progress Notes (Signed)
This encounter was created in error - please disregard.

## 2018-10-15 IMAGING — CT CT ABD-PELV W/ CM
2 of 5 series · 15 of 46 positions shown, 17 images · IV contrast (iopamidol)
Comparison: 05/16/2017

CLINICAL DATA: Upper abdominal pain

EXAM:
CT ABDOMEN AND PELVIS WITH CONTRAST
TECHNIQUE: Multidetector CT imaging of the abdomen and pelvis was performed
using the standard protocol following bolus administration of
intravenous contrast.
CONTRAST:  100mL GZRDXC-EUU IOPAMIDOL (GZRDXC-EUU) INJECTION 61%

[Series 2: axial st · axial · 0.67mm/px · z∈[+995,+1385]mm · 12 of 90 slices shown, 14 images]
[im 6/90  soft-tissue]
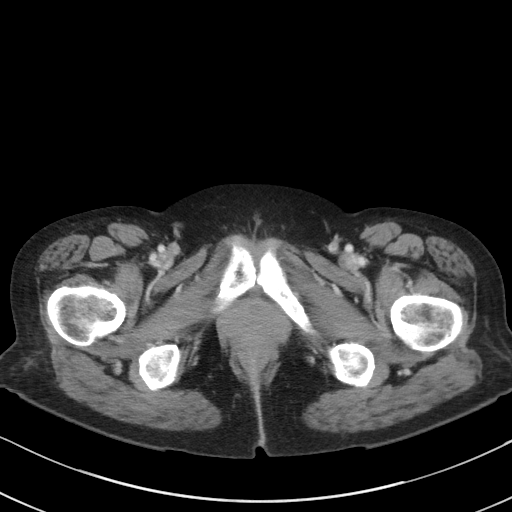
[im 6/90  bone]
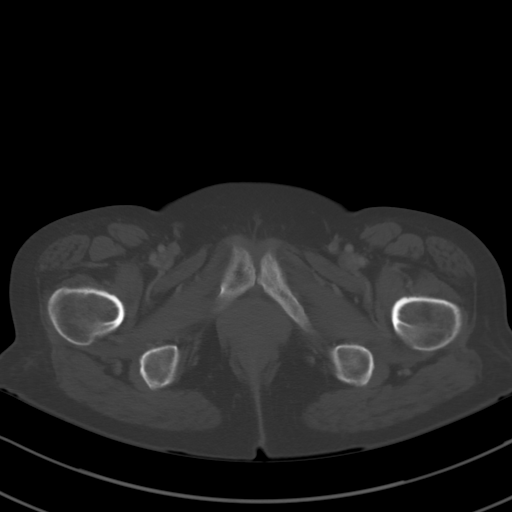
[im 16/90  soft-tissue]
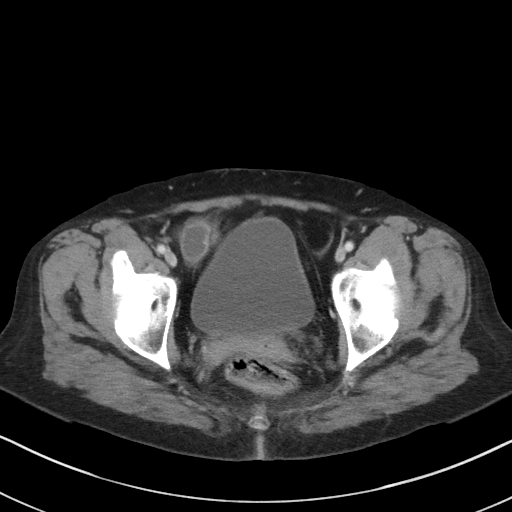
[im 21/90  soft-tissue]
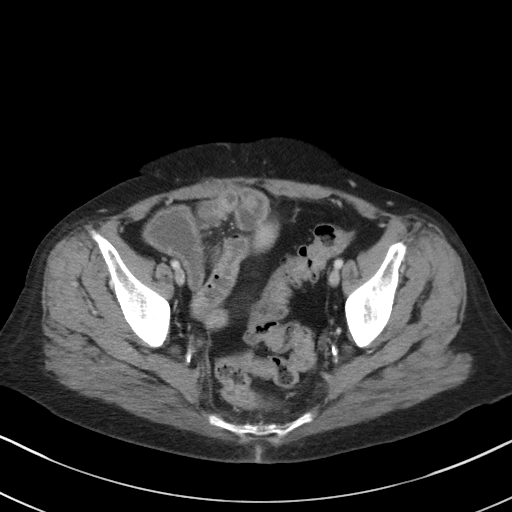
[im 27/90  soft-tissue]
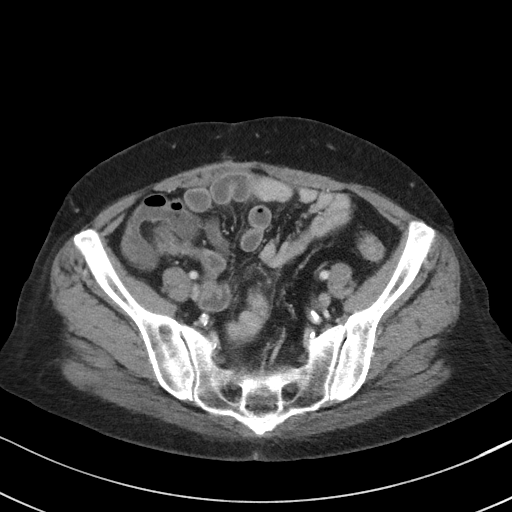
[im 37/90  soft-tissue]
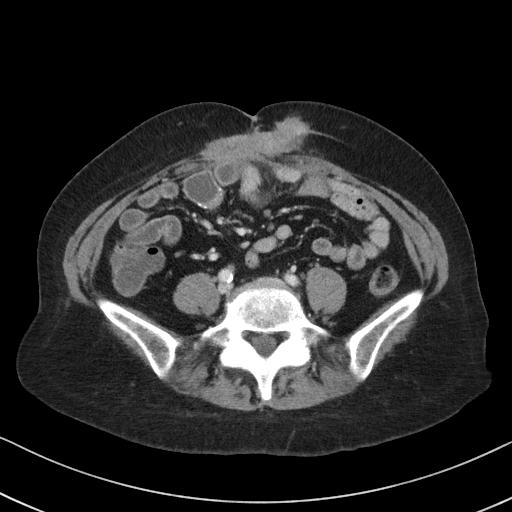
[im 42/90  soft-tissue]
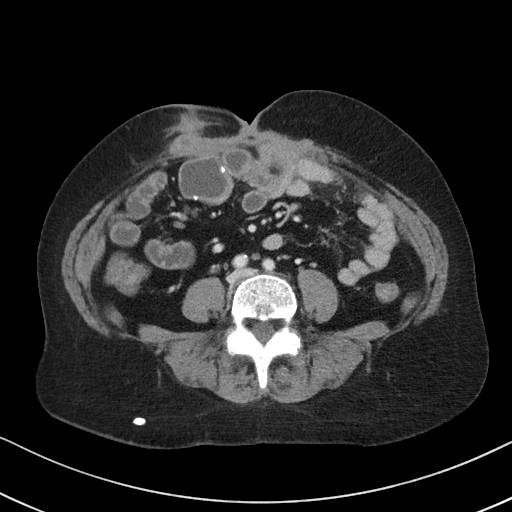
[im 48/90  soft-tissue]
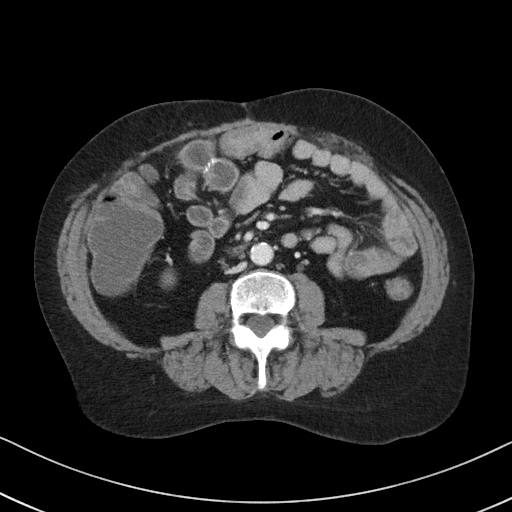
[im 58/90  soft-tissue]
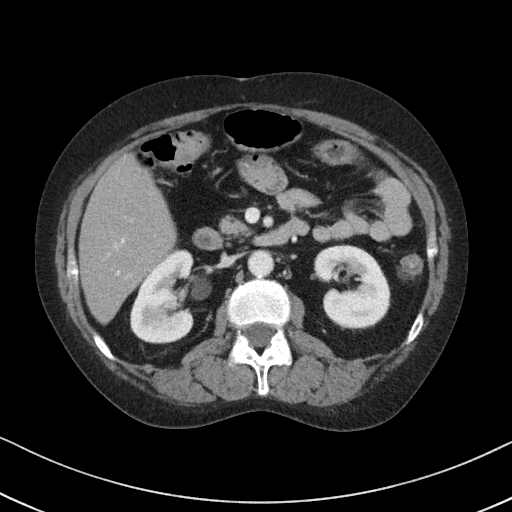
[im 63/90  soft-tissue]
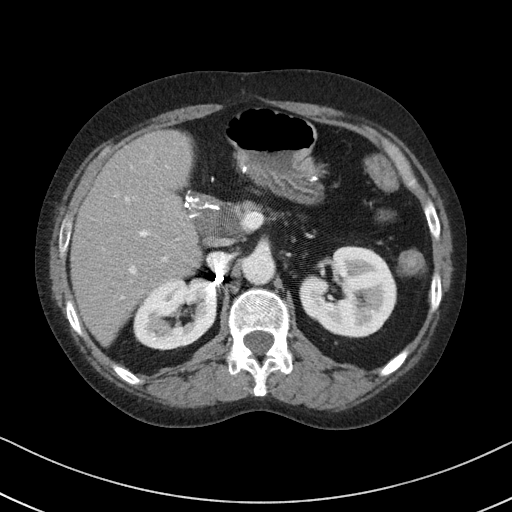
[im 63/90  bone]
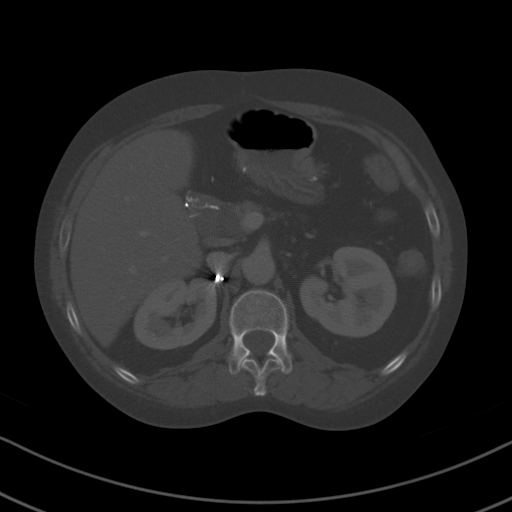
[im 69/90  soft-tissue]
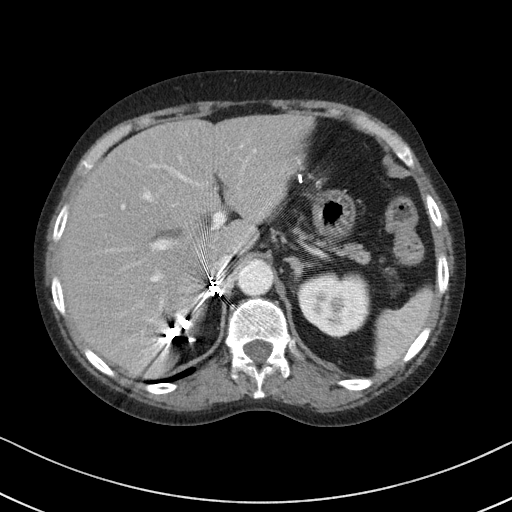
[im 79/90  soft-tissue]
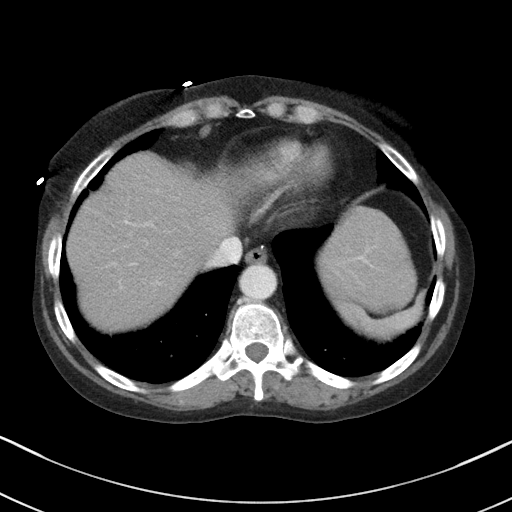
[im 84/90  soft-tissue]
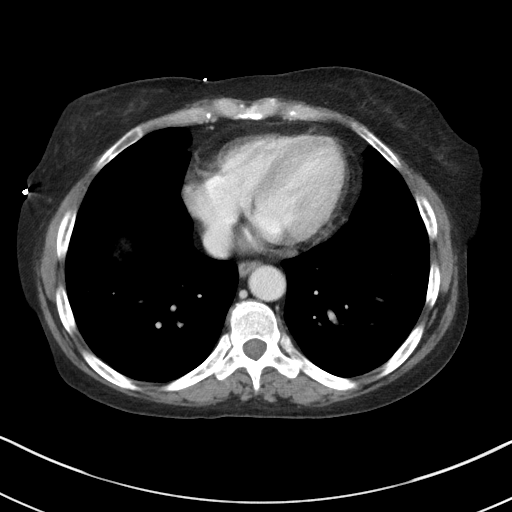

[Series 4: coronal st · coronal · 0.61mm/px · 3 of 87 slices shown]
[im 29/87  soft-tissue]
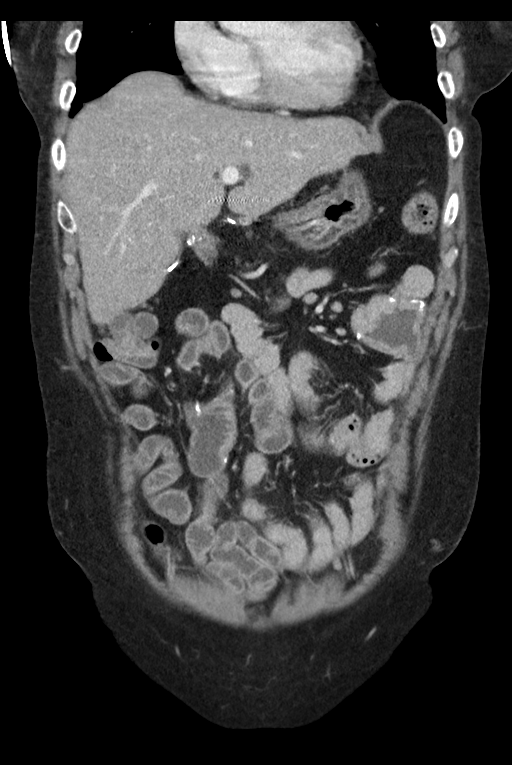
[im 39/87  soft-tissue]
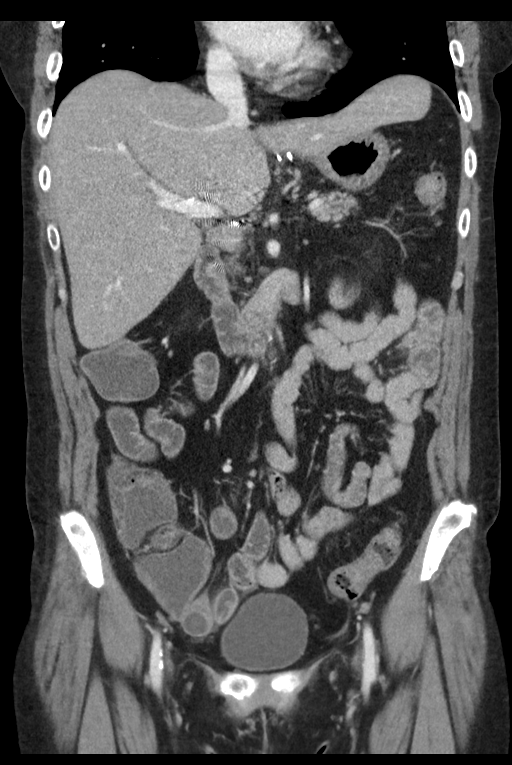
[im 48/87  soft-tissue]
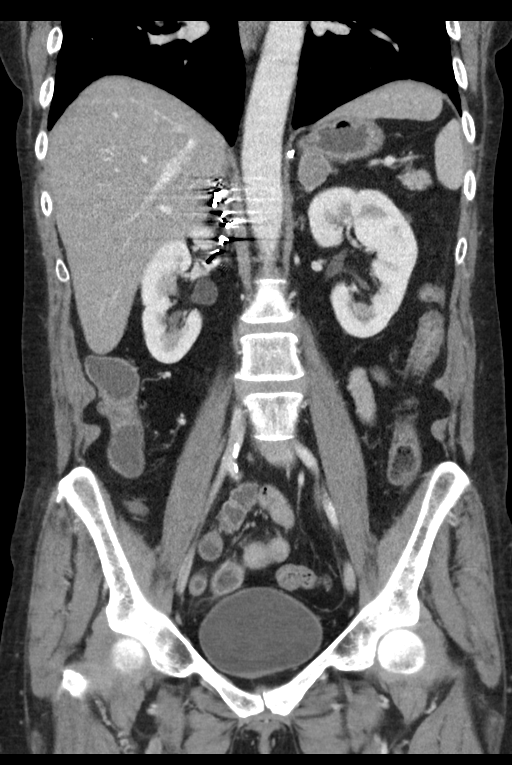

[15 of 46 positions shown; findings below may reference images not displayed]

FINDINGS: Lower chest: Lung bases are clear. No effusions. Heart is normal
size.

Hepatobiliary: Prior cholecystectomy. Mild fatty infiltration of the
liver. No focal hepatic abnormality. Mild intrahepatic and
extrahepatic biliary ductal dilatation, stable.

Pancreas: No focal abnormality or ductal dilatation.

Spleen: No focal abnormality.  Normal size.

Adrenals/Urinary Tract: Surgical clips around the region of the
right adrenal gland and right kidney, likely related to prior right
adrenalectomy.. Left adrenal nodule measures 1.9 cm, stable. No
renal mass or hydronephrosis.

Stomach/Bowel: Postoperative changes from gastric bypass. No
evidence of bowel obstruction.

Vascular/Lymphatic: Aortic and iliac calcifications. No evidence of
aneurysm or adenopathy.

Reproductive: Prior hysterectomy.  No adnexal masses.

Other: No free fluid or free air. Soft tissue thickening and
possible fluid collections again noted in the anterior abdominal
wall, extending to the skin surface to the left and right of the
umbilicus, stable since prior study.

Musculoskeletal: No acute bony abnormality.
IMPRESSION: Stable soft tissue thickening and fluid collection in the anterior
abdominal wall with small fistulous tracts to the left and right of
the umbilicus. This is unchanged since prior study.

Stable mild fatty infiltration of the liver. Stable intrahepatic and
extrahepatic biliary ductal dilatation.

Stable left adrenal adenoma.

No evidence of bowel obstruction.

Aortic atherosclerosis.

## 2018-11-11 ENCOUNTER — Observation Stay (HOSPITAL_COMMUNITY)
Admission: EM | Admit: 2018-11-11 | Discharge: 2018-11-12 | Disposition: A | Discharge status: Home / Self Care | Payer: Medicare Other | Attending: Family Medicine | Admitting: Family Medicine

## 2018-11-11 ENCOUNTER — Emergency Department (HOSPITAL_COMMUNITY): Payer: Medicare Other

## 2018-11-11 ENCOUNTER — Encounter (HOSPITAL_COMMUNITY): Payer: Self-pay

## 2018-11-11 ENCOUNTER — Other Ambulatory Visit: Payer: Self-pay

## 2018-11-11 DIAGNOSIS — Z20828 Contact with and (suspected) exposure to other viral communicable diseases: Secondary | ICD-10-CM | POA: Insufficient documentation

## 2018-11-11 DIAGNOSIS — Z8673 Personal history of transient ischemic attack (TIA), and cerebral infarction without residual deficits: Secondary | ICD-10-CM | POA: Insufficient documentation

## 2018-11-11 DIAGNOSIS — Z720 Tobacco use: Secondary | ICD-10-CM | POA: Diagnosis present

## 2018-11-11 DIAGNOSIS — F17210 Nicotine dependence, cigarettes, uncomplicated: Secondary | ICD-10-CM | POA: Insufficient documentation

## 2018-11-11 DIAGNOSIS — M3210 Systemic lupus erythematosus, organ or system involvement unspecified: Secondary | ICD-10-CM | POA: Diagnosis not present

## 2018-11-11 DIAGNOSIS — I10 Essential (primary) hypertension: Secondary | ICD-10-CM | POA: Insufficient documentation

## 2018-11-11 DIAGNOSIS — Z888 Allergy status to other drugs, medicaments and biological substances status: Secondary | ICD-10-CM | POA: Diagnosis not present

## 2018-11-11 DIAGNOSIS — R0609 Other forms of dyspnea: Secondary | ICD-10-CM | POA: Insufficient documentation

## 2018-11-11 DIAGNOSIS — G8929 Other chronic pain: Secondary | ICD-10-CM | POA: Diagnosis present

## 2018-11-11 DIAGNOSIS — M329 Systemic lupus erythematosus, unspecified: Secondary | ICD-10-CM | POA: Diagnosis present

## 2018-11-11 DIAGNOSIS — F1410 Cocaine abuse, uncomplicated: Secondary | ICD-10-CM | POA: Diagnosis not present

## 2018-11-11 DIAGNOSIS — K529 Noninfective gastroenteritis and colitis, unspecified: Secondary | ICD-10-CM | POA: Diagnosis not present

## 2018-11-11 DIAGNOSIS — K219 Gastro-esophageal reflux disease without esophagitis: Secondary | ICD-10-CM | POA: Diagnosis not present

## 2018-11-11 DIAGNOSIS — R000 Tachycardia, unspecified: Secondary | ICD-10-CM | POA: Insufficient documentation

## 2018-11-11 DIAGNOSIS — Z23 Encounter for immunization: Secondary | ICD-10-CM | POA: Insufficient documentation

## 2018-11-11 DIAGNOSIS — F1010 Alcohol abuse, uncomplicated: Secondary | ICD-10-CM | POA: Diagnosis present

## 2018-11-11 DIAGNOSIS — L02211 Cutaneous abscess of abdominal wall: Secondary | ICD-10-CM | POA: Insufficient documentation

## 2018-11-11 DIAGNOSIS — Z885 Allergy status to narcotic agent status: Secondary | ICD-10-CM | POA: Insufficient documentation

## 2018-11-11 DIAGNOSIS — F17200 Nicotine dependence, unspecified, uncomplicated: Secondary | ICD-10-CM | POA: Diagnosis present

## 2018-11-11 DIAGNOSIS — R1011 Right upper quadrant pain: Secondary | ICD-10-CM | POA: Diagnosis present

## 2018-11-11 DIAGNOSIS — Z79899 Other long term (current) drug therapy: Secondary | ICD-10-CM | POA: Diagnosis not present

## 2018-11-11 DIAGNOSIS — R1110 Vomiting, unspecified: Secondary | ICD-10-CM | POA: Diagnosis not present

## 2018-11-11 DIAGNOSIS — R197 Diarrhea, unspecified: Secondary | ICD-10-CM | POA: Diagnosis present

## 2018-11-11 DIAGNOSIS — L089 Local infection of the skin and subcutaneous tissue, unspecified: Secondary | ICD-10-CM | POA: Diagnosis present

## 2018-11-11 LAB — RAPID URINE DRUG SCREEN, HOSP PERFORMED: Barbiturates: NOT DETECTED

## 2018-11-11 LAB — CBC
HCT: 37.3 % (ref 36.0–46.0)
Hemoglobin: 13.3 g/dL (ref 12.0–15.0)
MCH: 29.4 pg (ref 26.0–34.0)
MCHC: 35.7 g/dL (ref 30.0–36.0)
MCV: 82.5 fL (ref 80.0–100.0)
Platelets: 303 10*3/uL (ref 150–400)
RBC: 4.52 MIL/uL (ref 3.87–5.11)
RDW: 14.6 % (ref 11.5–15.5)
WBC: 5.4 10*3/uL (ref 4.0–10.5)
nRBC: 0 % (ref 0.0–0.2)

## 2018-11-11 LAB — SARS CORONAVIRUS 2 (TAT 6-24 HRS): SARS Coronavirus 2: NEGATIVE

## 2018-11-11 LAB — URINALYSIS, ROUTINE W REFLEX MICROSCOPIC
Bilirubin Urine: NEGATIVE
Glucose, UA: NEGATIVE mg/dL
Hgb urine dipstick: NEGATIVE
Ketones, ur: 20 mg/dL — AB
Leukocytes,Ua: NEGATIVE
Nitrite: NEGATIVE
Protein, ur: NEGATIVE mg/dL
Specific Gravity, Urine: >1.046 — ABNORMAL HIGH (ref 1.005–1.030)
pH: 5 (ref 5.0–8.0)

## 2018-11-11 LAB — COMPREHENSIVE METABOLIC PANEL
ALT: 23 U/L (ref 0–44)
AST: 31 U/L (ref 15–41)
Albumin: 4.2 g/dL (ref 3.5–5.0)
Alkaline Phosphatase: 80 U/L (ref 38–126)
Anion gap: 12 (ref 5–15)
BUN: 9 mg/dL (ref 6–20)
CO2: 23 mmol/L (ref 22–32)
Calcium: 8.7 mg/dL — ABNORMAL LOW (ref 8.9–10.3)
Chloride: 103 mmol/L (ref 98–111)
Creatinine, Ser: 0.66 mg/dL (ref 0.44–1.00)
GFR calc Af Amer: >60 mL/min (ref >60)
GFR calc non Af Amer: >60 mL/min (ref >60)
Glucose, Bld: 103 mg/dL — ABNORMAL HIGH (ref 70–99)
Potassium: 3.3 mmol/L — ABNORMAL LOW (ref 3.5–5.1)
Sodium: 138 mmol/L (ref 135–145)
Total Bilirubin: 0.6 mg/dL (ref 0.3–1.2)
Total Protein: 7.7 g/dL (ref 6.5–8.1)

## 2018-11-11 LAB — C DIFFICILE QUICK SCREEN W PCR REFLEX
C Diff antigen: NEGATIVE
C Diff interpretation: NOT DETECTED
C Diff toxin: NEGATIVE

## 2018-11-11 LAB — MAGNESIUM: Magnesium: 1.9 mg/dL (ref 1.7–2.4)

## 2018-11-11 LAB — LIPASE, BLOOD: Lipase: 12 U/L (ref 11–51)

## 2018-11-11 MED ORDER — ONDANSETRON HCL 4 MG/2ML IJ SOLN
4.0000 mg | Freq: Once | INTRAMUSCULAR | Status: AC
  Administered 2018-11-11: 4 mg via INTRAVENOUS
  Filled 2018-11-11: qty 2

## 2018-11-11 MED ORDER — ATENOLOL 25 MG PO TABS
50.0000 mg | ORAL_TABLET | Freq: Every day | ORAL | Status: DC
  Administered 2018-11-11 – 2018-11-12 (×2): 50 mg via ORAL
  Filled 2018-11-11 (×2): qty 2

## 2018-11-11 MED ORDER — LACTATED RINGERS IV SOLN
INTRAVENOUS | Status: DC
  Administered 2018-11-11 – 2018-11-12 (×2): via INTRAVENOUS

## 2018-11-11 MED ORDER — VITAMIN B-1 100 MG PO TABS
100.0000 mg | ORAL_TABLET | Freq: Every day | ORAL | Status: DC
  Administered 2018-11-11 – 2018-11-12 (×2): 100 mg via ORAL
  Filled 2018-11-11 (×2): qty 1

## 2018-11-11 MED ORDER — ALBUTEROL SULFATE (2.5 MG/3ML) 0.083% IN NEBU: 2.5000 mg | INHALATION_SOLUTION | RESPIRATORY_TRACT | Status: DC | PRN

## 2018-11-11 MED ORDER — LORAZEPAM 2 MG/ML IJ SOLN: 1.0000 mg | Freq: Four times a day (QID) | INTRAMUSCULAR | Status: DC | PRN

## 2018-11-11 MED ORDER — HYDRALAZINE HCL 20 MG/ML IJ SOLN: 10.0000 mg | Freq: Four times a day (QID) | INTRAMUSCULAR | Status: DC | PRN

## 2018-11-11 MED ORDER — AMLODIPINE BESYLATE 5 MG PO TABS
5.0000 mg | ORAL_TABLET | Freq: Every day | ORAL | Status: DC
  Administered 2018-11-11 – 2018-11-12 (×2): 5 mg via ORAL
  Filled 2018-11-11 (×2): qty 1

## 2018-11-11 MED ORDER — LORAZEPAM 1 MG PO TABS
1.0000 mg | ORAL_TABLET | Freq: Four times a day (QID) | ORAL | Status: DC | PRN
  Administered 2018-11-11: 1 mg via ORAL
  Filled 2018-11-11: qty 1

## 2018-11-11 MED ORDER — CIPROFLOXACIN IN D5W 400 MG/200ML IV SOLN
400.0000 mg | Freq: Two times a day (BID) | INTRAVENOUS | Status: DC
  Administered 2018-11-12: 400 mg via INTRAVENOUS
  Filled 2018-11-11: qty 200

## 2018-11-11 MED ORDER — CIPROFLOXACIN IN D5W 400 MG/200ML IV SOLN
500.0000 mg | Freq: Once | INTRAVENOUS | Status: DC
  Filled 2018-11-11: qty 400

## 2018-11-11 MED ORDER — ACETAMINOPHEN 325 MG PO TABS: 650.0000 mg | ORAL_TABLET | Freq: Four times a day (QID) | ORAL | Status: DC | PRN

## 2018-11-11 MED ORDER — FENTANYL CITRATE (PF) 100 MCG/2ML IJ SOLN
25.0000 ug | INTRAMUSCULAR | Status: DC | PRN
  Administered 2018-11-11 – 2018-11-12 (×4): 25 ug via INTRAVENOUS
  Filled 2018-11-11 (×4): qty 2

## 2018-11-11 MED ORDER — SODIUM CHLORIDE 0.9% FLUSH: 3.0000 mL | Freq: Two times a day (BID) | INTRAVENOUS | Status: DC

## 2018-11-11 MED ORDER — SODIUM CHLORIDE 0.9% FLUSH: 3.0000 mL | INTRAVENOUS | Status: DC | PRN

## 2018-11-11 MED ORDER — SODIUM CHLORIDE 0.9 % IV BOLUS
500.0000 mL | Freq: Once | INTRAVENOUS | Status: AC
  Administered 2018-11-11: 500 mL via INTRAVENOUS

## 2018-11-11 MED ORDER — PNEUMOCOCCAL VAC POLYVALENT 25 MCG/0.5ML IJ INJ
0.5000 mL | INJECTION | INTRAMUSCULAR | Status: AC
  Administered 2018-11-12: 0.5 mL via INTRAMUSCULAR

## 2018-11-11 MED ORDER — PROMETHAZINE HCL 25 MG/ML IJ SOLN
12.5000 mg | Freq: Once | INTRAMUSCULAR | Status: AC
  Administered 2018-11-11: 12.5 mg via INTRAVENOUS
  Filled 2018-11-11: qty 1

## 2018-11-11 MED ORDER — HEPARIN SODIUM (PORCINE) 5000 UNIT/ML IJ SOLN
5000.0000 [IU] | Freq: Three times a day (TID) | INTRAMUSCULAR | Status: DC
  Administered 2018-11-11 – 2018-11-12 (×3): 5000 [IU] via SUBCUTANEOUS
  Filled 2018-11-11 (×3): qty 1

## 2018-11-11 MED ORDER — CIPROFLOXACIN IN D5W 400 MG/200ML IV SOLN
400.0000 mg | Freq: Once | INTRAVENOUS | Status: AC
  Administered 2018-11-11: 400 mg via INTRAVENOUS

## 2018-11-11 MED ORDER — FOLIC ACID 1 MG PO TABS
1.0000 mg | ORAL_TABLET | Freq: Every day | ORAL | Status: DC
  Administered 2018-11-11 – 2018-11-12 (×2): 1 mg via ORAL
  Filled 2018-11-11 (×2): qty 1

## 2018-11-11 MED ORDER — NICOTINE 14 MG/24HR TD PT24
14.0000 mg | MEDICATED_PATCH | Freq: Every day | TRANSDERMAL | Status: DC
  Administered 2018-11-11 – 2018-11-12 (×2): 14 mg via TRANSDERMAL
  Filled 2018-11-11 (×2): qty 1

## 2018-11-11 MED ORDER — THIAMINE HCL 100 MG/ML IJ SOLN: 100.0000 mg | Freq: Every day | INTRAMUSCULAR | Status: DC

## 2018-11-11 MED ORDER — SODIUM CHLORIDE 0.9 % IV SOLN: 250.0000 mL | INTRAVENOUS | Status: DC | PRN

## 2018-11-11 MED ORDER — SODIUM CHLORIDE 0.9% FLUSH
3.0000 mL | Freq: Once | INTRAVENOUS | Status: AC
  Administered 2018-11-11: 3 mL via INTRAVENOUS

## 2018-11-11 MED ORDER — ONDANSETRON 4 MG PO TBDP: 4.0000 mg | ORAL_TABLET | Freq: Three times a day (TID) | ORAL | Status: DC | PRN

## 2018-11-11 MED ORDER — TRAZODONE HCL 50 MG PO TABS
50.0000 mg | ORAL_TABLET | Freq: Every evening | ORAL | Status: DC | PRN
  Administered 2018-11-11: 50 mg via ORAL
  Filled 2018-11-11: qty 1

## 2018-11-11 MED ORDER — DIPHENHYDRAMINE HCL 50 MG/ML IJ SOLN
25.0000 mg | Freq: Three times a day (TID) | INTRAMUSCULAR | Status: DC | PRN
  Administered 2018-11-11: 25 mg via INTRAVENOUS
  Filled 2018-11-11: qty 1

## 2018-11-11 MED ORDER — ESCITALOPRAM OXALATE 10 MG PO TABS
20.0000 mg | ORAL_TABLET | Freq: Every day | ORAL | Status: DC
  Administered 2018-11-11 – 2018-11-12 (×2): 20 mg via ORAL
  Filled 2018-11-11 (×2): qty 2

## 2018-11-11 MED ORDER — OXYCODONE HCL 5 MG PO TABS
5.0000 mg | ORAL_TABLET | ORAL | Status: DC | PRN
  Administered 2018-11-12: 5 mg via ORAL
  Filled 2018-11-11: qty 1

## 2018-11-11 MED ORDER — OXYCODONE-ACETAMINOPHEN 5-325 MG PO TABS
1.0000 | ORAL_TABLET | Freq: Once | ORAL | Status: AC
  Administered 2018-11-11: 1 via ORAL
  Filled 2018-11-11: qty 1

## 2018-11-11 MED ORDER — METRONIDAZOLE IN NACL 5-0.79 MG/ML-% IV SOLN
500.0000 mg | Freq: Once | INTRAVENOUS | Status: AC
  Administered 2018-11-11: 500 mg via INTRAVENOUS
  Filled 2018-11-11: qty 100

## 2018-11-11 MED ORDER — KCL IN DEXTROSE-NACL 10-5-0.45 MEQ/L-%-% IV SOLN
INTRAVENOUS | Status: DC
  Administered 2018-11-12: 13:00:00 via INTRAVENOUS
  Filled 2018-11-11 (×7): qty 1000

## 2018-11-11 MED ORDER — ADULT MULTIVITAMIN W/MINERALS CH
1.0000 | ORAL_TABLET | Freq: Every day | ORAL | Status: DC
  Administered 2018-11-11 – 2018-11-12 (×2): 1 via ORAL
  Filled 2018-11-11 (×2): qty 1

## 2018-11-11 MED ORDER — IOHEXOL 300 MG/ML  SOLN
100.0000 mL | Freq: Once | INTRAMUSCULAR | Status: AC | PRN
  Administered 2018-11-11: 100 mL via INTRAVENOUS

## 2018-11-11 MED ORDER — METRONIDAZOLE IN NACL 5-0.79 MG/ML-% IV SOLN
500.0000 mg | Freq: Three times a day (TID) | INTRAVENOUS | Status: DC
  Administered 2018-11-11 – 2018-11-12 (×3): 500 mg via INTRAVENOUS
  Filled 2018-11-11 (×3): qty 100

## 2018-11-11 MED ORDER — ONDANSETRON HCL 4 MG/2ML IJ SOLN
4.0000 mg | Freq: Four times a day (QID) | INTRAMUSCULAR | Status: DC | PRN
  Administered 2018-11-11 – 2018-11-12 (×4): 4 mg via INTRAVENOUS
  Filled 2018-11-11 (×5): qty 2

## 2018-11-11 MED ORDER — ONDANSETRON HCL 4 MG PO TABS: 4.0000 mg | ORAL_TABLET | Freq: Four times a day (QID) | ORAL | Status: DC | PRN

## 2018-11-11 MED ORDER — FENTANYL CITRATE (PF) 100 MCG/2ML IJ SOLN
100.0000 ug | Freq: Once | INTRAMUSCULAR | Status: AC
  Administered 2018-11-11: 100 ug via INTRAVENOUS
  Filled 2018-11-11: qty 2

## 2018-11-11 MED ORDER — ACETAMINOPHEN 650 MG RE SUPP: 650.0000 mg | Freq: Four times a day (QID) | RECTAL | Status: DC | PRN

## 2018-11-11 NOTE — ED Provider Notes (Signed)
Emergency Department Provider Note   I have reviewed the triage vital signs and the nursing notes.   HISTORY  Chief Complaint Abdominal Pain   HPI Angel Price is a 53 y.o. female with PMH of lupus, HTN, gastritis, and complicated surgical history presents to the emergency department for evaluation of abdominal pain with nausea, vomiting, diarrhea.  Patient has had 5 days of symptoms which are gradually worsening.  She denies blood in the vomit or stool.  Her abdominal pain is mostly in the right, upper quadrant.  She has chronically draining abdominal wounds but has noticed some increased drainage from the wound on the right which is somewhat thick and more yellow.  She continues to have frequent diarrhea type bowel movements.  She does note some mild dyspnea and nasal congestion.  Denies any known fever but has not been checking regularly at home.  Denies chest pain or heart palpitations.   Past Medical History:  Diagnosis Date  . Abscess    soft tissue  . Adrenal mass ([place])   . Alcohol abuse   . Anxiety   . Blood transfusion without reported diagnosis   . Chronic abdominal pain   . Chronic wound infection of abdomen   . Colon polyp    colonoscopy 04/2014  . Depression   . Diverticulosis    colonoscopy 04/2014 moderat pan colonic  . Drug-seeking behavior   . Gastritis    EGD 05/2014  . Gastroparesis Nov 2015  . GERD (gastroesophageal reflux disease)   . Hemorrhoid    internal large  . Hiatal hernia   . History of Billroth II operation   . Hypertension   . Lung nodule    CT 02/2014 needs repeat 1 month  . Lung nodule < 6cm on CT 04/25/2014  . Lupus ([place])   . Malingering   . Nausea and vomiting    chronic, recurrent  . Pancreatitis   . Schatzki's ring    patent per EGD 04/2014  . Sickle cell trait ([place])   . Suicide attempt ([place])   . 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 Colitis 11/11/2018  .  Hemorrhoidal skin tag   . SBO (small bowel obstruction) ([place])   . Non-intractable vomiting with nausea   . Folate deficiency 05/11/2017  . Abdominal wall fistula   . Cellulitis of abdominal wall 01/02/2017  . Acute left hemiparesis ([place]) 12/05/2016  . Malingering 12/05/2016  . Weakness of left lower extremity 12/02/2016  . CVA (cerebral vascular accident) ([place]) 11/23/2016  . Bright red rectal bleeding 08/22/2016  . Hypotension due to blood loss   . Acute GI bleeding 05/24/2016  . Dieulafoy lesion of duodenum   . History of Billroth II operation   . Gastrointestinal hemorrhage 05/22/2016  . Absolute anemia   . Acute pancreatitis 10/01/2015  . Abnormal CT scan of lung 10/01/2015  . Alcohol intoxication ([place])   . Left-sided weakness   . Psychosomatic factor in physical condition   . Upper GI bleed   . Diverticulosis of colon with hemorrhage   . Chronic wound infection of abdomen 12/22/2014  . Thyroid disease 12/22/2014  . HTN (hypertension) 12/22/2014  . Ataxia 11/01/2014  . Hemorrhoids 04/26/2014  . Lung nodule 04/26/2014  . Diverticulosis   . Gastritis   . Hiatal hernia   . Schatzki's ring   . Acute blood loss anemia 04/25/2014  . Sinus tachycardia 04/25/2014  .  Hypokalemia 04/25/2014  . Hematemesis with nausea 04/25/2014  . Lung nodule < 6cm on CT 04/25/2014  . Intractable nausea and vomiting 04/24/2014  . Gastroparesis   . Abdominal pain 04/01/2014  . Chronic abdominal pain 01/21/2014  . Gastroenteritis 12/10/2013  . Chronic abdominal wound infection 08/16/2013  . MDD (major depressive disorder), recurrent episode, severe ([place]) 06/27/2013  . Wrist laceration 06/24/2013  . Diarrhea 05/07/2013  . Rectal bleeding 05/07/2013  . Abnormal LFTs 05/07/2013  . Adrenal mass, left ([place]) 05/07/2013  . Abdominal wall abscess at site of surgical wound 04/19/2013  . Abdominal wall abscess 04/18/2013  . Frequent headaches 03/30/2013  . Sleep difficulties 03/30/2013  .  Essential hypertension, benign 03/30/2013  . History of cocaine abuse ([place]) 03/16/2013  . History of schizoaffective disorder 03/16/2013  . Bipolar disorder ([place]) 03/16/2013  . Personality disorder ([place]) 03/16/2013  . Tobacco abuse 03/16/2013  . Alcohol abuse 03/16/2013  . Palpitations 03/16/2013  . Poor vision 03/16/2013  . History of gastric bypass 03/16/2013  . Status post hysterectomy with oophorectomy 03/16/2013  . Hypothyroid 03/16/2013  . Lupus ([place]) 03/16/2013    Past Surgical History:  Procedure Laterality Date  . ABDOMINAL HYSTERECTOMY  2013   [place]  . ABDOMINAL SURGERY    . ADRENALECTOMY Right   . AGILE CAPSULE N/A 01/05/2015   Procedure: AGILE CAPSULE;  Surgeon: Daneil Dolin, MD;  Location[facility];  Service: Endoscopy;  Laterality: N/A;  0700  . Billroth II procedure      [place], first 2000, 2005/2006.  Marland Kitchen BIOPSY  05/20/2013   Procedure: BIOPSIES OF ASCENDING AND SIGMOID COLON;  Surgeon: Daneil Dolin, MD;  Location: [facility];  Service: Endoscopy;;  . BIOPSY  04/26/2014   Procedure: BIOPSIES;  Surgeon: Danie Binder, MD;  Location: [facility];  Service: Endoscopy;;  . CHOLECYSTECTOMY    . COLONOSCOPY     in [place]  . COLONOSCOPY WITH PROPOFOL N/A 05/20/2013   Dr.Rourk- inadequate prep, normal appearing rectum, grossly normal colon aside from pancolonic diverticula, normal terminal ileum bx= unremarkable colonic mucosa. Due for early interval 2016.   Marland Kitchen COLONOSCOPY WITH PROPOFOL N/A 04/26/2014   [id number] ileum/one colon polyp removed/moderate pan-colonic diverticulosis/large internal hemorrhoids  . COLONOSCOPY WITH PROPOFOL N/A 12/23/2014   Dr.Rourk- minimal internal hemorrhoids, pancolonic diverticulosis  . COLONOSCOPY WITH PROPOFOL N/A 08/23/2016   Procedure: COLONOSCOPY WITH PROPOFOL;  Surgeon: Danie Binder, MD;  [facility];  Service: Endoscopy;  Laterality: N/A;  . DEBRIDEMENT OF ABDOMINAL WALL ABSCESS N/A 02/08/2013   Procedure: DEBRIDEMENT OF  ABDOMINAL WALL ABSCESS;  Surgeon: Jamesetta So, MD;  Location: [facility];  Service: General;  Laterality: N/A;  . ESOPHAGOGASTRODUODENOSCOPY (EGD) WITH PROPOFOL N/A 05/20/2013   Dr.Rourk- s/p prior gastric surgery with normal esophagus, residual gastric mucosa and patent efferent limb  . ESOPHAGOGASTRODUODENOSCOPY (EGD) WITH PROPOFOL N/A 02/03/2014   Dr. Gala Romney:  s/p hemigastrectomy with retained gastric contents. Residual gastric mucosa and efferent limb appeared normal otherwise. Query gastroparesis.   Marland Kitchen ESOPHAGOGASTRODUODENOSCOPY (EGD) WITH PROPOFOL N/A 04/26/2014   [id number] ring/small HH/mild non-erosive gasrtitis/normal anastomosis  . ESOPHAGOGASTRODUODENOSCOPY (EGD) WITH PROPOFOL N/A 12/23/2014   Dr.Rourk- s/p prior hemigastrctomy, active oozing from anastomotic suture site, hemostasis achieved  . ESOPHAGOGASTRODUODENOSCOPY (EGD) WITH PROPOFOL N/A 05/23/2016   Dr. 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 ESOPHAGOGASTRODUODENOSCOPY (EGD) WITH PROPOFOL N/A 05/11/2017   Procedure: ESOPHAGOGASTRODUODENOSCOPY (EGD) WITH PROPOFOL;  Surgeon: Daneil Dolin, MD;  [facility];  Service: Endoscopy;  Laterality: N/A;  . HEMORRHOID SURGERY N/A 06/18/2017   Procedure: THREE COLUMN EXTENSIVE HEMORRHOIDECTOMY;  Surgeon: Virl Cagey, MD;  Location: [facility];  Service: General;  Laterality: N/A;  . INCISION AND DRAINAGE ABSCESS N/A 01/06/2017   Procedure: INCISION AND DRAINAGE ABDOMINAL WALL ABSCESS;  Surgeon: Aviva Signs, MD;  Location: [facility];  Service: General;  Laterality: N/A;  . tendon repar Right    wrist  . WOUND EXPLORATION Right 06/24/2013   Procedure: exploration of traumatic wound right wrist;  Surgeon: Tennis Must, MD;  Location: [facility];  Service: Orthopedics;  Laterality: Right;    Allergies Codeine, Morphine and related, and Reglan [metoclopramide]  Family History  Problem  Relation Age of Onset  . Brain cancer Son   . Schizophrenia Son   . Cancer Son        brain  . Lung cancer Father   . Cancer Father        mets  . Drug abuse Mother   . Breast cancer Maternal Aunt   . Bipolar disorder Maternal Aunt   . Drug abuse Maternal Aunt   . Colon cancer Maternal Grandmother        late 50s, early 22s  . Drug abuse Sister   . Drug abuse Brother   . Bipolar disorder Paternal Grandfather   . Bipolar disorder Cousin   . Liver disease Neg Hx     Social History Social History   Tobacco Use  . Smoking status: Current Every Day Smoker    Packs/day: 0.50    Years: 35.00    Pack years: 17.50    Types: Cigarettes  . Smokeless tobacco: Never Used  Substance Use Topics  . Alcohol use: Yes    Alcohol/week: 0.0 standard drinks    Comment: 2-3 cans every other day  . Drug use: No    Types: Cocaine    Comment: last used 4 days ago    Review of Systems  Constitutional: No fever/chills Eyes: No visual changes. ENT: No sore throat. Cardiovascular: Denies chest pain. Respiratory: Denies shortness of breath. Gastrointestinal: Positive abdominal pain. Positive nausea, vomiting, and diarrhea.  No constipation. Genitourinary: Negative for dysuria. Musculoskeletal: Negative for back pain. Skin: Negative for rash. Neurological: Negative for headaches, focal weakness or numbness.  10-point ROS otherwise negative.  ____________________________________________   PHYSICAL EXAM:  VITAL SIGNS: ED Triage Vitals  Enc Vitals Group     BP 11/11/18 0819 (!) 151/117     Pulse Rate 11/11/18 0819 (!) 105     Resp 11/11/18 0819 20     Temp 11/11/18 0819 98 F (36.7 C)     Temp Source 11/11/18 0819 Oral     SpO2 --      Weight 11/11/18 0816 130 lb (59 kg)     Height 11/11/18 0816 5\' 3"  (1.6 m)   Constitutional: Alert and oriented. Well appearing and in no acute distress. Eyes: Conjunctivae are normal.  Head: Atraumatic. Nose: No  congestion/rhinnorhea. Mouth/Throat: Mucous membranes are somewhat dry.  Neck: No stridor.  Cardiovascular: Tachycardia. Good peripheral circulation. Grossly normal heart sounds.   Respiratory: Normal respiratory effort.  No retractions. Lungs CTAB. Gastrointestinal: Soft with mild diffuse tenderness. Focal tenderness more in the RUQ and mid-epigastric region. No rebound or guarding. No distention.  Musculoskeletal: No lower extremity tenderness nor edema. No gross deformities of extremities. Neurologic:  Normal speech and language.  Skin:  Skin is warm, dry and intact. No rash noted.   ____________________________________________   LABS (all labs ordered are listed, but only abnormal results are displayed)  Labs Reviewed  COMPREHENSIVE METABOLIC PANEL - Abnormal; Notable for the following components:      Result Value   Potassium 3.3 (*)    Glucose, Bld 103 (*)    Calcium 8.7 (*)    All other components within normal limits  URINALYSIS, ROUTINE W REFLEX MICROSCOPIC - Abnormal; Notable for the following components:   Specific Gravity, Urine >1.046 (*)    Ketones, ur 20 (*)    All other components within normal limits  SARS CORONAVIRUS 2 (TAT 6-12 HRS)  GASTROINTESTINAL PANEL BY PCR, STOOL (REPLACES STOOL CULTURE)  C DIFFICILE QUICK SCREEN W PCR REFLEX  LIPASE, BLOOD  CBC  MAGNESIUM  HIV ANTIBODY (ROUTINE TESTING W REFLEX)  RAPID URINE DRUG SCREEN, HOSP PERFORMED  CBC   ____________________________________________  RADIOLOGY  Ct Abdomen Pelvis W Contrast  Result Date: 11/11/2018 CLINICAL DATA:  Generalized abdominal pain, nausea, vomiting EXAM: CT ABDOMEN AND PELVIS WITH CONTRAST TECHNIQUE: Multidetector CT imaging of the abdomen and pelvis was performed using the standard protocol following bolus administration of intravenous contrast. CONTRAST:  135mL OMNIPAQUE IOHEXOL 300 MG/ML  SOLN COMPARISON:  06/02/2018 FINDINGS: Lower chest: No acute abnormality. Hepatobiliary:  Mildly decreased attenuation of the hepatic parenchyma suggesting fatty infiltration. Lesion. Status post cholecystectomy. No biliary dilatation. Pancreas: Unremarkable. No pancreatic ductal dilatation or surrounding inflammatory changes. Spleen: Normal in size without focal abnormality. Adrenals/Urinary Tract: Stable postsurgical changes from prior right adrenalectomy. Stable 2.1 cm left adrenal adenoma. Kidneys are unremarkable without hydronephrosis. Urinary bladder is incompletely distended, limiting its evaluation. Stomach/Bowel: Mild diffuse colonic wall thickening. No focal pericolonic inflammatory changes. There is also mild wall thickening of the terminal ileum. Appendix not clearly visualized. Postsurgical changes from gastro jejunostomy. No evidence of bowel obstruction. Vascular/Lymphatic: Scattered aortoiliac atherosclerotic calcification. No abdominal or pelvic lymphadenopathy. Reproductive: Status post hysterectomy. No adnexal masses. Other: Stable appearance of extensive soft tissue attenuation within the anterior abdominal wall at and near the umbilicus, compatible with exuberant scarring. No ascites. Musculoskeletal: No acute or significant osseous findings. IMPRESSION: 1. Mild diffuse colonic wall thickening suggestive of a non-specific colitis. Terminal ileum also demonstrates a thickened appearance, which raises the possibility for inflammatory bowel disease. 2. Chronic postsurgical changes within the abdomen and pelvis, not significantly changed compared to prior study. Electronically Signed   By: Davina Poke M.D.   On: 11/11/2018 10:21   Dg Chest Portable 1 View  Result Date: 11/11/2018 CLINICAL DATA:  Pt c/o abd, n/v/d x 5 days. Hx sickle cell, Lupus, HTN, GERD, smoker EXAM: PORTABLE CHEST 1 VIEW COMPARISON:  Chest radiographs dated 07/18/2017, 05/14/2017 FINDINGS: Stable cardiomediastinal contours within normal limits for [facility] technique. The lungs are clear. No pneumothorax or pleural  effusion. Multiple surgical clips are noted in the right upper quadrant of the abdomen. No acute finding in the visualized skeleton. IMPRESSION: No acute cardiopulmonary process. Electronically Signed   By: Audie Pinto M.D.   On: 11/11/2018 09:22    ____________________________________________   PROCEDURES  Procedure(s) performed:   Procedures  None ____________________________________________   INITIAL IMPRESSION / ASSESSMENT AND PLAN / ED COURSE  Pertinent labs & imaging results that were available during my care of the patient were reviewed by me and considered in my medical decision making (see chart for details).   Patient presents to the emergency department for evaluation of abdominal pain with  nausea, vomiting, diarrhea.  Unknown fever but afebrile here.  Mild tachycardia noted.  Patient with history of lupus but not on any medications for this.  Patient with complicated past surgical history including Billroth and abdominal wall abscess.  Plan for CT imaging to evaluate for the possibility of abscess causing secondary infection versus partial small bowel obstruction.  Patient is awake and alert.  She is able to provide a full history.  Doubt sepsis.   CT reviewed. Colitis noted. Possible IBD etiology. Patient with continued pain and dry heaving with PO challenge in the ED. Labs reviewed. Plan for admit for IVF, pain control, and abx.   Discussed patient's case with [facility] to request admission. Patient and family (if present) updated with plan. Care transferred to [facility] service.  I reviewed all nursing notes, vitals, pertinent old records, EKGs, labs, imaging (as available).  ____________________________________________  FINAL CLINICAL IMPRESSION(S) / ED DIAGNOSES  Final diagnoses:  Colitis     MEDICATIONS GIVEN DURING THIS VISIT:  Medications  lactated ringers infusion ( Intravenous New Bag/Given 11/11/18 1222)  amLODipine (NORVASC) tablet 5 mg (5 mg Oral Given  11/11/18 1544)  escitalopram (LEXAPRO) tablet 20 mg (20 mg Oral Given 11/11/18 1544)  atenolol (TENORMIN) tablet 50 mg (50 mg Oral Given 11/11/18 1544)  ondansetron (ZOFRAN-ODT) disintegrating tablet 4 mg (has no administration in time range)  sodium chloride flush (NS) 0.9 % injection 3 mL (3 mLs Intravenous Not Given 11/11/18 1552)  sodium chloride flush (NS) 0.9 % injection 3 mL (has no administration in time range)  0.9 %  sodium chloride infusion (has no administration in time range)  acetaminophen (TYLENOL) tablet 650 mg (has no administration in time range)    Or  acetaminophen (TYLENOL) suppository 650 mg (has no administration in time range)  traZODone (DESYREL) tablet 50 mg (has no administration in time range)  ondansetron (ZOFRAN) tablet 4 mg ( Oral See Alternative 11/11/18 1544)    Or  ondansetron (ZOFRAN) injection 4 mg (4 mg Intravenous Given 11/11/18 1544)  albuterol (PROVENTIL) (2.5 MG/3ML) 0.083% nebulizer solution 2.5 mg (has no administration in time range)  heparin injection 5,000 Units (5,000 Units Subcutaneous Given 11/11/18 1552)  hydrALAZINE (APRESOLINE) injection 10 mg (has no administration in time range)  dextrose 5 % and 0.45 % NaCl with KCl 10 mEq/L infusion (has no administration in time range)  metroNIDAZOLE (FLAGYL) IVPB 500 mg (has no administration in time range)  oxyCODONE (Oxy IR/ROXICODONE) immediate release tablet 5 mg (has no administration in time range)  fentaNYL (SUBLIMAZE) injection 25 mcg (25 mcg Intravenous Given 11/11/18 1544)  ciprofloxacin (CIPRO) IVPB 400 mg (has no administration in time range)  nicotine (NICODERM CQ - dosed in mg/24 hours) patch 14 mg (14 mg Transdermal Patch Applied 11/11/18 1806)  LORazepam (ATIVAN) tablet 1 mg (has no administration in time range)    Or  LORazepam (ATIVAN) injection 1 mg (has no administration in time range)  thiamine (VITAMIN B-1) tablet 100 mg (100 mg Oral Given 11/11/18 1806)    Or  thiamine (B-1) injection  100 mg ( Intravenous See Alternative [id number])  folic acid (FOLVITE) tablet 1 mg (1 mg Oral Given 11/11/18 1806)  multivitamin with minerals tablet 1 tablet (1 tablet Oral Given 11/11/18 1806)  diphenhydrAMINE (BENADRYL) injection 25 mg (25 mg Intravenous Given 11/11/18 1804)  sodium chloride flush (NS) 0.9 % injection 3 mL (3 mLs Intravenous Given 11/11/18 1109)  sodium chloride 0.9 % bolus 500 mL (0 mLs Intravenous Stopped 11/11/18  [id number])  ondansetron (ZOFRAN) injection 4 mg (4 mg Intravenous Given 11/11/18 0854)  iohexol (OMNIPAQUE) 300 MG/ML solution 100 mL (100 mLs Intravenous Contrast Given 11/11/18 0933)  fentaNYL (SUBLIMAZE) injection 100 mcg (100 mcg Intravenous Given 11/11/18 0956)  promethazine (PHENERGAN) injection 12.5 mg (12.5 mg Intravenous Given 11/11/18 1109)  oxyCODONE-acetaminophen (PERCOCET/ROXICET) 5-325 MG per tablet 1 tablet (1 tablet Oral Given 11/11/18 1133)  metroNIDAZOLE (FLAGYL) IVPB 500 mg (500 mg Intravenous New Bag/Given 11/11/18 1340)  ciprofloxacin (CIPRO) IVPB 400 mg (0 mg Intravenous Stopped 11/11/18 1438)    Note:  This document was prepared using Dragon voice recognition software and may include unintentional dictation errors.  Nanda Quinton, MD Emergency Medicine    , [facility], MD 11/11/18 [phone]

## 2018-11-11 NOTE — Progress Notes (Signed)
Has not voided or had any bms since being admitted to floor.  C/O itching but no rash and received order for benadryl.  Stated that she vomited when trying full liquid supper.  Old abd surgical wounds draining scant amount of serous drainage but stated that the drainage is sometimes green since this current illness.

## 2018-11-11 NOTE — H&P (Signed)
Patient Demographics:    Angel Price, is a 53 y.o. 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  Inflammatory bowel disease given involvement of the terminal ileum,  -stool for GI pathogens and stool for C. difficile requested and pending  2)Intractable Nausea and Vomiting--- secondary to #1 above, advance liquid diet as tolerated, IV fluids in the meantime  3)HTN--- okay to resume Amlodipine 5 mg daily and Atenolol 50 mg daily, hold lisinopril due to GI losses at risk for dehydration and AKI,  -may use IV Hydralazine 10 mg  Every 4 hours Prn for systolic blood pressure over 160 mmhg  4)H/o Polysubstance Abuse--patient admits to ongoing tobacco and alcohol abuse--- give nicotine patch and use lorazepam per CIWA  protocol -Thiamine and folic acid as ordered  5)Depressive disorder--- stable, continue Lexapro  6)Chronically draining anterior abdominal wall wounds unchanged from baseline--- continue local wound care with dry dressing   With History of - Reviewed by me  Past Medical History:  Diagnosis Date  . Abscess    soft tissue  . Adrenal mass ([place])   . Alcohol abuse   . Anxiety   . Blood transfusion without reported diagnosis   . Chronic abdominal pain   . Chronic wound infection of abdomen   . Colon polyp    colonoscopy 04/2014  . Depression   . Diverticulosis    colonoscopy 04/2014 moderat pan colonic  . Drug-seeking behavior   . Gastritis    EGD 05/2014  . Gastroparesis Nov 2015  . GERD (gastroesophageal reflux disease)   . Hemorrhoid    internal large  . Hiatal hernia   . History of Billroth II operation   . Hypertension   . Lung nodule    CT 02/2014 needs repeat 1 month  . Lung nodule < 6cm on CT 04/25/2014  . Lupus ([place])   . Malingering   . Nausea and vomiting    chronic, recurrent  . Pancreatitis   . Schatzki's ring  patent per EGD 04/2014  . Sickle cell trait ([place])   . Suicide attempt ([place])   . Thyroid disease 2000   overactive, radiation      Past Surgical History:  Procedure Laterality Date  . ABDOMINAL HYSTERECTOMY  2013   [place]  . ABDOMINAL SURGERY    . ADRENALECTOMY Right   . AGILE CAPSULE N/A 01/05/2015   Procedure: AGILE CAPSULE;  Surgeon: Daneil Dolin, MD;  [facility];  Service: Endoscopy;  Laterality: N/A;  0700  . Billroth II procedure      [place], first 2000, 2005/2006.  Marland Kitchen BIOPSY  05/20/2013   Procedure: BIOPSIES OF ASCENDING AND SIGMOID COLON;  Surgeon: Daneil Dolin, MD;  Location: [facility];  Service: Endoscopy;;  . BIOPSY  04/26/2014   Procedure: BIOPSIES;  Surgeon: Danie Binder, MD;  Location: [facility];  Service: Endoscopy;;  . CHOLECYSTECTOMY    . COLONOSCOPY     in [place]  . COLONOSCOPY WITH PROPOFOL N/A 05/20/2013    Dr.Rourk- inadequate prep, normal appearing rectum, grossly normal colon aside from pancolonic diverticula, normal terminal ileum bx= unremarkable colonic mucosa. Due for early interval 2016.   Marland Kitchen COLONOSCOPY WITH PROPOFOL N/A 04/26/2014   [id number] ileum/one colon polyp removed/moderate pan-colonic diverticulosis/large internal hemorrhoids  . COLONOSCOPY WITH PROPOFOL N/A 12/23/2014   Dr.Rourk- minimal internal hemorrhoids, pancolonic diverticulosis  . COLONOSCOPY WITH PROPOFOL N/A 08/23/2016   Procedure: COLONOSCOPY WITH PROPOFOL;  Surgeon: Danie Binder, MD;  [facility];  Service: Endoscopy;  Laterality: N/A;  . DEBRIDEMENT OF ABDOMINAL WALL ABSCESS N/A 02/08/2013   Procedure: DEBRIDEMENT OF ABDOMINAL WALL ABSCESS;  Surgeon: Jamesetta So, MD;  Location: [facility];  Service: General;  Laterality: N/A;  . ESOPHAGOGASTRODUODENOSCOPY (EGD) WITH PROPOFOL N/A 05/20/2013   Dr.Rourk- s/p prior gastric surgery with normal esophagus, residual gastric mucosa and patent efferent limb  . ESOPHAGOGASTRODUODENOSCOPY (EGD) WITH PROPOFOL N/A 02/03/2014   Dr. Gala Romney:  s/p hemigastrectomy with retained gastric contents. Residual gastric mucosa and efferent limb appeared normal otherwise. Query gastroparesis.   Marland Kitchen ESOPHAGOGASTRODUODENOSCOPY (EGD) WITH PROPOFOL N/A 04/26/2014   [id number] ring/small HH/mild non-erosive gasrtitis/normal anastomosis  . ESOPHAGOGASTRODUODENOSCOPY (EGD) WITH PROPOFOL N/A 12/23/2014   Dr.Rourk- s/p prior hemigastrctomy, active oozing from anastomotic suture site, hemostasis achieved  . ESOPHAGOGASTRODUODENOSCOPY (EGD) WITH PROPOFOL N/A 05/23/2016   Dr. 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 ESOPHAGOGASTRODUODENOSCOPY (EGD) WITH PROPOFOL N/A 05/11/2017   Procedure: ESOPHAGOGASTRODUODENOSCOPY (EGD) WITH PROPOFOL;  Surgeon: Daneil Dolin, MD;  [facility];  Service:  Endoscopy;  Laterality: N/A;  . HEMORRHOID SURGERY N/A 06/18/2017   Procedure: THREE COLUMN EXTENSIVE HEMORRHOIDECTOMY;  Surgeon: Virl Cagey, MD;  Location: [facility];  Service: General;  Laterality: N/A;  . INCISION AND DRAINAGE ABSCESS N/A 01/06/2017   Procedure: INCISION AND DRAINAGE ABDOMINAL WALL ABSCESS;  Surgeon: Aviva Signs, MD;  Location: [facility];  Service: General;  Laterality: N/A;  . tendon repar Right    wrist  . WOUND EXPLORATION Right 06/24/2013   Procedure: exploration of traumatic wound right wrist;  Surgeon: Tennis Must, MD;  Location: [facility];  Service: Orthopedics;  Laterality: Right;     Chief Complaint  Patient presents with  . Abdominal Pain      HPI:    Jolette Berkland  is a 53 y.o. 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, history of abdominal wall abscesses, periumbilical fistulas, GERD, gastroparesis, as well as history of  chronic pain syndrome, as well as prior history of polysubstance abuse including alcohol and cocaine in the past presents with intractable emesis and abdominal pain-with a CT abdomen and pelvis findings of colitis infectious versus inflammatory - Symptoms started around 11/06/2018--abdominal pain, nausea, vomiting and diarrhea that worsened over the last 4 to 5 days -Patient denies frank fevers or chills --Stools were watery without blood or mucus --Emesis was without bile or blood -Abdominal pain is worse in the right upper quadrant  --Patient admits to drinking about 3 large cans of beer a day  -Patient was chronically draining anterior abdominal wall wounds unchanged from baseline  WBC is 5.4 with H&H of 13.3 and 37.3 Lipase is not elevated  CT Abd/Pelvis- 1) Mild diffuse colonic wall thickening suggestive of a  non-specific colitis. Terminal ileum also demonstrates a thickened appearance, which raises the possibility for inflammatory bowel disease.  EDP gave Flagyl and Cipro  Requested hospitalization due to persistent emesis--patient failed trial of oral intake in the ED   Review of systems:    In addition to the HPI above,   A full Review of  Systems was done, all other systems reviewed are negative except as noted above in HPI , .    Social History:  Reviewed by me    Social History   Tobacco Use  . Smoking status: Current Every Day Smoker    Packs/day: 0.50    Years: 35.00    Pack years: 17.50    Types: Cigarettes  . Smokeless tobacco: Never Used  Substance Use Topics  . Alcohol use: Yes    Alcohol/week: 0.0 standard drinks    Comment: 2-3 cans every other day      Family History :  Reviewed by me    Family History  Problem Relation Age of Onset  . Brain cancer Son   . Schizophrenia Son   . Cancer Son        brain  . Lung cancer Father   . Cancer Father        mets  . Drug abuse Mother   . Breast cancer Maternal Aunt   . Bipolar disorder Maternal Aunt   . Drug abuse Maternal Aunt   . Colon cancer Maternal Grandmother        late 36s, early 29s  . Drug abuse Sister   . Drug abuse Brother   . Bipolar disorder Paternal Grandfather   . Bipolar disorder Cousin   . Liver disease Neg Hx      Home Medications:   Prior to Admission medications   Medication Sig Start Date End Date Taking? Authorizing Provider  amLODipine (NORVASC) 10 MG tablet Take 0.5 tablets (5 mg total) by mouth daily. 01/03/17  Yes Patrecia Pour, Christean Grief, MD  atenolol (TENORMIN) 50 MG tablet Take 50 mg by mouth daily.   Yes [provider]  escitalopram (LEXAPRO) 20 MG tablet Take 20 mg daily by mouth.   Yes [provider]  lisinopril (PRINIVIL,ZESTRIL) 20 MG tablet Take 1 tablet (20 mg total) by mouth daily. 11/26/16 11/11/18 Yes Reyne Dumas, MD  ondansetron (ZOFRAN ODT) 4 MG  disintegrating tablet Take 1 tablet (4 mg total) by mouth every 8 (eight) hours as needed for nausea. 07/01/17  Yes Noemi Chapel, MD  Allergies:     Allergies  Allergen Reactions  . Codeine Hives  . Morphine And Related Hives  . Reglan [Metoclopramide] Other (See Comments)    EPS symptoms      Physical Exam:   Vitals  Blood pressure 126/89, pulse 76, temperature 99 F (37.2 C), temperature source Oral, resp. rate 17, height 5\' 3"  (1.6 m), weight 59 kg, SpO2 98 %.  Physical Examination: General appearance - alert, well appearing, and in no distress  Mental status - alert, oriented to person, place, and time,  Eyes - sclera anicteric Neck - supple, no JVD elevation , Chest - clear  to auscultation bilaterally, symmetrical air movement,  Heart - S1 and S2 normal, regular  Abdomen - soft, anterior abdominal wall with open chronically draining wounds with serous drainage , bowel sounds present, abdominal discomfort to palpation without rebound or guarding neurological - screening mental status exam normal, neck supple without rigidity, cranial nerves II through XII intact, DTR's normal and symmetric Extremities - no pedal edema noted, intact peripheral pulses  Skin - warm, dry     Data Review:    CBC Recent Labs  Lab 11/11/18 0846  WBC 5.4  HGB 13.3  HCT 37.3  PLT 303  MCV 82.5  MCH 29.4  MCHC 35.7  RDW 14.6   ------------------------------------------------------------------------------------------------------------------  Chemistries  Recent Labs  Lab 11/11/18 0846  NA 138  K 3.3*  CL 103  CO2 23  GLUCOSE 103*  BUN 9  CREATININE 0.66  CALCIUM 8.7*  MG 1.9  AST 31  ALT 23  ALKPHOS 80  BILITOT 0.6   ------------------------------------------------------------------------------------------------------------------ estimated creatinine clearance is 68.8 mL/min (by C-G formula based on SCr of 0.66  mg/dL). ------------------------------------------------------------------------------------------------------------------ No results for input(s): TSH, T4TOTAL, T3FREE, THYROIDAB in the last 72 hours.  Invalid input(s): FREET3   Coagulation profile No results for input(s): INR, PROTIME in the last 168 hours. ------------------------------------------------------------------------------------------------------------------- No results for input(s): DDIMER in the last 72 hours. -------------------------------------------------------------------------------------------------------------------  Cardiac Enzymes No results for input(s): CKMB, TROPONINI, MYOGLOBIN in the last 168 hours.  Invalid input(s): CK ------------------------------------------------------------------------------------------------------------------    Component Value Date/Time   BNP 13.0 03/14/2014 1054     ---------------------------------------------------------------------------------------------------------------  Urinalysis    Component Value Date/Time   COLORURINE YELLOW 11/11/2018 0822   APPEARANCEUR CLEAR 11/11/2018 0822   LABSPEC >1.046 (H) 11/11/2018 0822   PHURINE 5.0 11/11/2018 0822   GLUCOSEU NEGATIVE 11/11/2018 0822   HGBUR NEGATIVE 11/11/2018 0822   BILIRUBINUR NEGATIVE 11/11/2018 0822   KETONESUR 20 (A) 11/11/2018 0822   PROTEINUR NEGATIVE 11/11/2018 0822   UROBILINOGEN 0.2 11/26/2014 0915   NITRITE NEGATIVE 11/11/2018 0822   LEUKOCYTESUR NEGATIVE 11/11/2018 [id number]    ----------------------------------------------------------------------------------------------------------------   Imaging Results:    Ct Abdomen Pelvis W Contrast  Result Date: 11/11/2018 CLINICAL DATA:  Generalized abdominal pain, nausea, vomiting EXAM: CT ABDOMEN AND PELVIS WITH CONTRAST TECHNIQUE: Multidetector CT imaging of the abdomen and pelvis was performed using the standard protocol following bolus administration  of intravenous contrast. CONTRAST:  161mL OMNIPAQUE IOHEXOL 300 MG/ML  SOLN COMPARISON:  06/02/2018 FINDINGS: Lower chest: No acute abnormality. Hepatobiliary: Mildly decreased attenuation of the hepatic parenchyma suggesting fatty infiltration. Lesion. Status post cholecystectomy. No biliary dilatation. Pancreas: Unremarkable. No pancreatic ductal dilatation or surrounding inflammatory changes. Spleen: Normal in size without focal abnormality. Adrenals/Urinary Tract: Stable postsurgical changes from prior right adrenalectomy. Stable 2.1 cm left adrenal adenoma. Kidneys are unremarkable without hydronephrosis. Urinary bladder is incompletely distended, limiting its evaluation. Stomach/Bowel: Mild diffuse  colonic wall thickening. No focal pericolonic inflammatory changes. There is also mild wall thickening of the terminal ileum. Appendix not clearly visualized. Postsurgical changes from gastro jejunostomy. No evidence of bowel obstruction. Vascular/Lymphatic: Scattered aortoiliac atherosclerotic calcification. No abdominal or pelvic lymphadenopathy. Reproductive: Status post hysterectomy. No adnexal masses. Other: Stable appearance of extensive soft tissue attenuation within the anterior abdominal wall at and near the umbilicus, compatible with exuberant scarring. No ascites. Musculoskeletal: No acute or significant osseous findings. IMPRESSION: 1. Mild diffuse colonic wall thickening suggestive of a non-specific colitis. Terminal ileum also demonstrates a thickened appearance, which raises the possibility for inflammatory bowel disease. 2. Chronic postsurgical changes within the abdomen and pelvis, not significantly changed compared to prior study. Electronically Signed   By: Davina Poke M.D.   On: 11/11/2018 10:21   Dg Chest Portable 1 View  Result Date: 11/11/2018 CLINICAL DATA:  Pt c/o abd, n/v/d x 5 days. Hx sickle cell, Lupus, HTN, GERD, smoker EXAM: PORTABLE CHEST 1 VIEW COMPARISON:  Chest radiographs  dated 07/18/2017, 05/14/2017 FINDINGS: Stable cardiomediastinal contours within normal limits for AP technique. The lungs are clear. No pneumothorax or pleural effusion. Multiple surgical clips are noted in the right upper quadrant of the abdomen. No acute finding in the visualized skeleton. IMPRESSION: No acute cardiopulmonary process. Electronically Signed   By: Audie Pinto M.D.   On: 11/11/2018 09:22    Radiological Exams on Admission: Ct Abdomen Pelvis W Contrast  Result Date: 11/11/2018 CLINICAL DATA:  Generalized abdominal pain, nausea, vomiting EXAM: CT ABDOMEN AND PELVIS WITH CONTRAST TECHNIQUE: Multidetector CT imaging of the abdomen and pelvis was performed using the standard protocol following bolus administration of intravenous contrast. CONTRAST:  167mL OMNIPAQUE IOHEXOL 300 MG/ML  SOLN COMPARISON:  06/02/2018 FINDINGS: Lower chest: No acute abnormality. Hepatobiliary: Mildly decreased attenuation of the hepatic parenchyma suggesting fatty infiltration. Lesion. Status post cholecystectomy. No biliary dilatation. Pancreas: Unremarkable. No pancreatic ductal dilatation or surrounding inflammatory changes. Spleen: Normal in size without focal abnormality. Adrenals/Urinary Tract: Stable postsurgical changes from prior right adrenalectomy. Stable 2.1 cm left adrenal adenoma. Kidneys are unremarkable without hydronephrosis. Urinary bladder is incompletely distended, limiting its evaluation. Stomach/Bowel: Mild diffuse colonic wall thickening. No focal pericolonic inflammatory changes. There is also mild wall thickening of the terminal ileum. Appendix not clearly visualized. Postsurgical changes from gastro jejunostomy. No evidence of bowel obstruction. Vascular/Lymphatic: Scattered aortoiliac atherosclerotic calcification. No abdominal or pelvic lymphadenopathy. Reproductive: Status post hysterectomy. No adnexal masses. Other: Stable appearance of extensive soft tissue attenuation within the  anterior abdominal wall at and near the umbilicus, compatible with exuberant scarring. No ascites. Musculoskeletal: No acute or significant osseous findings. IMPRESSION: 1. Mild diffuse colonic wall thickening suggestive of a non-specific colitis. Terminal ileum also demonstrates a thickened appearance, which raises the possibility for inflammatory bowel disease. 2. Chronic postsurgical changes within the abdomen and pelvis, not significantly changed compared to prior study. Electronically Signed   By: Davina Poke M.D.   On: 11/11/2018 10:21   Dg Chest Portable 1 View  Result Date: 11/11/2018 CLINICAL DATA:  Pt c/o abd, n/v/d x 5 days. Hx sickle cell, Lupus, HTN, GERD, smoker EXAM: PORTABLE CHEST 1 VIEW COMPARISON:  Chest radiographs dated 07/18/2017, 05/14/2017 FINDINGS: Stable cardiomediastinal contours within normal limits for AP technique. The lungs are clear. No pneumothorax or pleural effusion. Multiple surgical clips are noted in the right upper quadrant of the abdomen. No acute finding in the visualized skeleton. IMPRESSION: No acute cardiopulmonary process. Electronically Signed   By: Izora Gala  Dimas Aguas M.D.   On: 11/11/2018 09:22    DVT Prophylaxis -SCD  /Heparin AM Labs Ordered, also please review Full Orders  Family Communication: Admission, patients condition and plan of care including tests being ordered have been discussed with the patient who indicate understanding and agree with the plan   Code Status - Full Code  Likely DC to  Home   Condition   stable  Roxan Hockey M.D on 11/11/2018 at 4:08 PM Go to [email or web address] -  for contact info  [facility] - Office  [phone]

## 2018-11-11 NOTE — Progress Notes (Signed)
Pharmacy Antibiotic Note  Angel Price is a 53 y.o. female admitted on 11/11/2018 with intra-abdominal infection. Pharmacy has been consulted for Cipro dosing.  Plan: Start Cipro 400mg  IV q12h Monitor labs, cultures and patient progress.  Height: 5\' 3"  (160 cm) Weight: 130 lb (59 kg) IBW/kg (Calculated) : 52.4  Temp (24hrs), Avg:98.4 F (36.9 C), Min:98 F (36.7 C), Max:98.7 F (37.1 C)  Recent Labs  Lab 11/11/18 0846  WBC 5.4  CREATININE 0.66    Estimated Creatinine Clearance: 68.8 mL/min (by C-G formula based on SCr of 0.66 mg/dL).    Allergies  Allergen Reactions  . Codeine Hives  . Morphine And Related Hives  . Reglan [Metoclopramide] Other (See Comments)    EPS symptoms     Antimicrobials this admission: Cipro 8/26 >>   Flagyl 8/26 >>    Microbiology results: n/a   Thank you for allowing pharmacy to participate in this patient's care.  Despina Pole, Pharm. D. Clinical Pharmacist 11/11/2018 12:42 PM

## 2018-11-11 NOTE — ED Triage Notes (Signed)
Pt c/o abd, n/v/d x 5 days.  Unknown if has had fever.

## 2018-11-11 NOTE — ED Notes (Signed)
Advised pt a urine sample is needed. Pt stated she does not need to go at this time.

## 2018-11-12 ENCOUNTER — Encounter: Payer: Self-pay | Admitting: Internal Medicine

## 2018-11-12 ENCOUNTER — Encounter (HOSPITAL_COMMUNITY): Payer: Self-pay | Admitting: Gastroenterology

## 2018-11-12 ENCOUNTER — Telehealth: Payer: Self-pay | Admitting: Gastroenterology

## 2018-11-12 DIAGNOSIS — L988 Other specified disorders of the skin and subcutaneous tissue: Secondary | ICD-10-CM | POA: Diagnosis not present

## 2018-11-12 DIAGNOSIS — G8929 Other chronic pain: Secondary | ICD-10-CM

## 2018-11-12 DIAGNOSIS — F1410 Cocaine abuse, uncomplicated: Secondary | ICD-10-CM

## 2018-11-12 DIAGNOSIS — K529 Noninfective gastroenteritis and colitis, unspecified: Secondary | ICD-10-CM | POA: Diagnosis not present

## 2018-11-12 DIAGNOSIS — R109 Unspecified abdominal pain: Secondary | ICD-10-CM | POA: Diagnosis not present

## 2018-11-12 LAB — GASTROINTESTINAL PANEL BY PCR, STOOL (REPLACES STOOL CULTURE)

## 2018-11-12 LAB — RAPID URINE DRUG SCREEN, HOSP PERFORMED
Amphetamines: NOT DETECTED
Barbiturates: NOT DETECTED
Benzodiazepines: NOT DETECTED
Cocaine: POSITIVE — AB
Opiates: NOT DETECTED
Tetrahydrocannabinol: NOT DETECTED

## 2018-11-12 LAB — CBC
HCT: 30.6 % — ABNORMAL LOW (ref 36.0–46.0)
Hemoglobin: 10.7 g/dL — ABNORMAL LOW (ref 12.0–15.0)
MCH: 29.6 pg (ref 26.0–34.0)
MCHC: 35 g/dL (ref 30.0–36.0)
MCV: 84.5 fL (ref 80.0–100.0)
Platelets: 255 10*3/uL (ref 150–400)
RBC: 3.62 MIL/uL — ABNORMAL LOW (ref 3.87–5.11)
RDW: 14.1 % (ref 11.5–15.5)
WBC: 3.9 10*3/uL — ABNORMAL LOW (ref 4.0–10.5)
nRBC: 0 % (ref 0.0–0.2)

## 2018-11-12 LAB — OCCULT BLOOD X 1 CARD TO LAB, STOOL: Fecal Occult Bld: NEGATIVE

## 2018-11-12 LAB — HIV ANTIBODY (ROUTINE TESTING W REFLEX): HIV Screen 4th Generation wRfx: NONREACTIVE

## 2018-11-12 MED ORDER — CIPROFLOXACIN HCL 500 MG PO TABS: 500.0000 mg | ORAL_TABLET | Freq: Two times a day (BID) | ORAL | 0 refills | Status: AC

## 2018-11-12 MED ORDER — ATENOLOL 25 MG PO TABS: 25.0000 mg | ORAL_TABLET | Freq: Every day | ORAL | 2 refills | Status: DC

## 2018-11-12 MED ORDER — ONDANSETRON 4 MG PO TBDP: 4.0000 mg | ORAL_TABLET | Freq: Three times a day (TID) | ORAL | 0 refills | Status: DC | PRN

## 2018-11-12 MED ORDER — METRONIDAZOLE 500 MG PO TABS: 500.0000 mg | ORAL_TABLET | Freq: Three times a day (TID) | ORAL | 0 refills | Status: AC

## 2018-11-12 MED ORDER — THIAMINE HCL 100 MG PO TABS: 100.0000 mg | ORAL_TABLET | Freq: Every day | ORAL | 2 refills | Status: DC

## 2018-11-12 MED ORDER — CIPROFLOXACIN HCL 500 MG PO TABS: 500.0000 mg | ORAL_TABLET | Freq: Two times a day (BID) | ORAL | 0 refills | Status: DC

## 2018-11-12 MED ORDER — FOLIC ACID 1 MG PO TABS: 1.0000 mg | ORAL_TABLET | Freq: Every day | ORAL | 2 refills | Status: DC

## 2018-11-12 MED ORDER — NICOTINE 14 MG/24HR TD PT24: 14.0000 mg | MEDICATED_PATCH | Freq: Every day | TRANSDERMAL | 0 refills | Status: DC

## 2018-11-12 NOTE — Discharge Summary (Signed)
Angel Price, is a 53 y.o. female  DOB 1965/05/11  MRN [id number].  Admission date:  11/11/2018  Admitting Physician  Roxan Hockey, MD  Discharge Date:  11/12/2018   Primary MD  Rosita Fire, MD  Recommendations for primary care physician for things to follow:    1) Please do NOT drink alcohol while taking Flagyl/antibiotics for your colitis as you will get violently ill 2)Follow-up with gastroenterologist as outpatient in a couple of weeks  to schedule  ileocolonoscopy procedure if your urine drug screen negative. 3) Abstinence from cocaine, tobacco and alcohol strongly advised 4) Repeat complete blood count/CBC and repeat BMP test within a week with a primary care provider strongly advised 5) please take ciprofloxacin and metronidazole antibiotics as prescribed for 1 week  Admission Diagnosis  Colitis [K52.9]  Discharge Diagnosis  Colitis [K52.9]    Principal Problem:   Colitis Active Problems:   Tobacco abuse   Alcohol abuse   Lupus ([place])   Diarrhea   Chronic abdominal pain   Chronic wound infection of abdomen   Cocaine abuse ([place])      Past Medical History:  Diagnosis Date  . Abscess    soft tissue  . Adrenal mass ([place])   . Alcohol abuse   . Anxiety   . Blood transfusion without reported diagnosis   . Chronic abdominal pain   . Chronic wound infection of abdomen   . Colon polyp    colonoscopy 04/2014  . Depression   . Diverticulosis    colonoscopy 04/2014 moderat pan colonic  . Drug-seeking behavior   . Gastritis    EGD 05/2014  . Gastroparesis Nov 2015  . GERD (gastroesophageal reflux disease)   . Hemorrhoid    internal large  . Hiatal hernia   . History of Billroth II operation   . Hypertension   . Lung nodule    CT 02/2014 needs repeat 1 month  . Lung nodule < 6cm on CT 04/25/2014  . Lupus ([place])   . Malingering   . Nausea and vomiting    chronic, recurrent  .  Pancreatitis   . Schatzki's ring    patent per EGD 04/2014  . Sickle cell trait ([place])   . Suicide attempt ([place])   . 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 ABDOMINAL HYSTERECTOMY  2013   [place]  . ABDOMINAL SURGERY    . ADRENALECTOMY Right   . AGILE CAPSULE N/A 01/05/2015   Procedure: AGILE CAPSULE;  Surgeon: Daneil Dolin, MD;  [facility];  Service: Endoscopy;  Laterality: N/A;  0700  . Billroth II procedure      [place], first 2000, 2005/2006.  Marland Kitchen BIOPSY  05/20/2013   Procedure: BIOPSIES OF ASCENDING AND SIGMOID COLON;  Surgeon: Daneil Dolin, MD;  Location: [facility];  Service: Endoscopy;;  . BIOPSY  04/26/2014   Procedure: BIOPSIES;  Surgeon: Danie Binder, MD;  Location: [facility];  Service: Endoscopy;;  .  CHOLECYSTECTOMY    . COLONOSCOPY     in [place]  . COLONOSCOPY WITH PROPOFOL N/A 05/20/2013   Dr.Rourk- inadequate prep, normal appearing rectum, grossly normal colon aside from pancolonic diverticula, normal terminal ileum bx= unremarkable colonic mucosa. Due for early interval 2016.   Marland Kitchen COLONOSCOPY WITH PROPOFOL N/A 04/26/2014   [id number] ileum/one colon polyp removed/moderate pan-colonic diverticulosis/large internal hemorrhoids  . COLONOSCOPY WITH PROPOFOL N/A 12/23/2014   Dr.Rourk- minimal internal hemorrhoids, pancolonic diverticulosis  . COLONOSCOPY WITH PROPOFOL N/A 08/23/2016   Procedure: COLONOSCOPY WITH PROPOFOL;  Surgeon: Danie Binder, MD;  [facility];  Service: Endoscopy;  Laterality: N/A;  . DEBRIDEMENT OF ABDOMINAL WALL ABSCESS N/A 02/08/2013   Procedure: DEBRIDEMENT OF ABDOMINAL WALL ABSCESS;  Surgeon: Jamesetta So, MD;  Location: [facility];  Service: General;  Laterality: N/A;  . ESOPHAGOGASTRODUODENOSCOPY (EGD) WITH PROPOFOL N/A 05/20/2013   Dr.Rourk- s/p prior gastric surgery with normal esophagus, residual gastric mucosa and patent efferent limb  . ESOPHAGOGASTRODUODENOSCOPY  (EGD) WITH PROPOFOL N/A 02/03/2014   Dr. Gala Romney:  s/p hemigastrectomy with retained gastric contents. Residual gastric mucosa and efferent limb appeared normal otherwise. Query gastroparesis.   Marland Kitchen ESOPHAGOGASTRODUODENOSCOPY (EGD) WITH PROPOFOL N/A 04/26/2014   [id number] ring/small HH/mild non-erosive gasrtitis/normal anastomosis  . ESOPHAGOGASTRODUODENOSCOPY (EGD) WITH PROPOFOL N/A 12/23/2014   Dr.Rourk- s/p prior hemigastrctomy, active oozing from anastomotic suture site, hemostasis achieved  . ESOPHAGOGASTRODUODENOSCOPY (EGD) WITH PROPOFOL N/A 05/23/2016   Dr. 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 ESOPHAGOGASTRODUODENOSCOPY (EGD) WITH PROPOFOL N/A 05/11/2017   Procedure: ESOPHAGOGASTRODUODENOSCOPY (EGD) WITH PROPOFOL;  Surgeon: Daneil Dolin, MD;  [facility];  Service: Endoscopy;  Laterality: N/A;  . HEMORRHOID SURGERY N/A 06/18/2017   Procedure: THREE COLUMN EXTENSIVE HEMORRHOIDECTOMY;  Surgeon: Virl Cagey, MD;  Location: [facility];  Service: General;  Laterality: N/A;  . INCISION AND DRAINAGE ABSCESS N/A 01/06/2017   Procedure: INCISION AND DRAINAGE ABDOMINAL WALL ABSCESS;  Surgeon: Aviva Signs, MD;  Location: [facility];  Service: General;  Laterality: N/A;  . tendon repar Right    wrist  . WOUND EXPLORATION Right 06/24/2013   Procedure: exploration of traumatic wound right wrist;  Surgeon: Tennis Must, MD;  Location: [facility];  Service: Orthopedics;  Laterality: Right;     HPI  from the history and physical done on the day of admission:   Angel Price  is a 53 y.o. 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, history of abdominal wall abscesses, periumbilical fistulas, GERD,  gastroparesis, as well as history of  chronic pain syndrome, as well as prior history of polysubstance abuse including alcohol and cocaine in the past presents with intractable emesis and abdominal pain-with a CT abdomen and pelvis findings of colitis infectious versus inflammatory - Symptoms started around 11/06/2018--abdominal pain, nausea, vomiting and diarrhea that worsened over the last 4 to 5 days -Patient denies frank fevers or chills --Stools were watery without blood or mucus --Emesis was without bile or blood -Abdominal pain is worse in the right upper quadrant  --Patient admits to drinking about 3 large cans of beer a day  -Patient was chronically draining anterior abdominal wall wounds unchanged from baseline  WBC is 5.4 with H&H of 13.3 and 37.3 Lipase is not elevated  CT  Abd/Pelvis- 1) Mild diffuse colonic wall thickening suggestive of a non-specific colitis. Terminal ileum also demonstrates a thickened appearance, which raises the possibility for inflammatory bowel disease.  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 Cipro and  Flagyl, -Patient strongly advised to avoid alcohol intake while on Flagyl - GI consult requested-- ???  Inflammatory bowel disease given involvement of the terminal ileum,  -Follow-up with gastroenterologist as outpatient in a couple of weeks  to schedule  ileocolonoscopy procedure if your urine drug screen negative. -stool for GI pathogens and stool for C. difficile are BOTH negative   2)Intractable Nausea and Vomiting--- secondary to #1 above--much improved, --Tolerating oral intake well  -Frequency and consistency of diarrhea has improved   3)HTN--- stable, continue amlodipine, decrease atenolol to 25 mg daily--- patient is to be weaned off atenolol completely given her ongoing use of cocaine, -Lisinopril discontinued due to concerns about hydration status and risk for AKI  4)H/o Polysubstance Abuse--patient admits to ongoing tobacco and alcohol abuse---  nicotine patch -Thiamine and folic acid as ordered -UDS is positive for cocaine  5)Depressive disorder--- stable, continue Lexapro  6)Chronically draining anterior abdominal wall wounds unchanged from baseline--- continue local wound care with dry dressing   Discharge Condition: stable  Follow UP--Follow-up with gastroenterologist as outpatient in a couple of weeks  to schedule  ileocolonoscopy procedure if your urine drug screen negative.   Consults obtained - Gi  Diet and Activity recommendation:  As advised  Discharge Instructions     Discharge Instructions    Call MD for:  difficulty breathing, headache or visual disturbances   Complete by: As directed    Call MD for:  persistant nausea and vomiting   Complete by: As directed    Call MD for:  temperature >100.4   Complete by: As directed    Diet - low sodium heart healthy   Complete by: As directed    Discharge instructions   Complete by: As directed    1) please do NOT drink alcohol while taking Flagyl/antibiotics for your colitis as he will get violently  ill 2) follow-up with gastroenterologist as outpatient in a couple of weeks  to schedule  ileocolonoscopy procedure if your urine drug screen negative. 3) abstinence from cocaine, tobacco and alcohol strongly advised 4) repeat complete blood count/CBC and repeat BMP test within a week with a primary care provider strongly advised   Increase activity slowly   Complete by: As directed        Discharge Medications     Allergies as of 11/12/2018      Reactions   Codeine Hives   Morphine And Related Hives   Reglan [metoclopramide] Other (See Comments)   EPS symptoms       Medication List  STOP taking these medications   lisinopril 20 MG tablet Commonly known as: ZESTRIL     TAKE these medications   amLODipine 10 MG tablet Commonly known as: NORVASC Take 0.5 tablets (5 mg total) by mouth daily.   atenolol 25 MG tablet Commonly known as: TENORMIN Take 1 tablet (25 mg total) by mouth daily. What changed:   medication strength  how much to take   ciprofloxacin 500 MG tablet Commonly known as: Cipro Take 1 tablet (500 mg total) by mouth 2 (two) times daily for 10 days.   escitalopram 20 MG tablet Commonly known as: LEXAPRO Take 20 mg daily by mouth.   folic acid 1 MG tablet Commonly known as: FOLVITE Take 1 tablet (1 mg total) by mouth daily. Start taking on: November 13, 2018   metroNIDAZOLE 500 MG tablet Commonly known as: Flagyl Take 1 tablet (500 mg total) by mouth 3 (three) times daily for 14 days.   nicotine 14 mg/24hr patch Commonly known as: NICODERM CQ - dosed in mg/24 hours Place 1 patch (14 mg total) onto the skin daily. Start taking on: November 13, 2018   ondansetron 4 MG disintegrating tablet Commonly known as: Zofran ODT Take 1 tablet (4 mg total) by mouth every 8 (eight) hours as needed for nausea.   thiamine 100 MG tablet Take 1 tablet (100 mg total) by mouth daily. Start taking on: November 13, 2018       Major procedures and Radiology  Reports - PLEASE review detailed and final reports for all details, in brief -    Ct Abdomen Pelvis W Contrast  Result Date: 11/11/2018 CLINICAL DATA:  Generalized abdominal pain, nausea, vomiting EXAM: CT ABDOMEN AND PELVIS WITH CONTRAST TECHNIQUE: Multidetector CT imaging of the abdomen and pelvis was performed using the standard protocol following bolus administration of intravenous contrast. CONTRAST:  156mL OMNIPAQUE IOHEXOL 300 MG/ML  SOLN COMPARISON:  06/02/2018 FINDINGS: Lower chest: No acute abnormality. Hepatobiliary: Mildly decreased attenuation of the hepatic parenchyma suggesting fatty infiltration. Lesion. Status post cholecystectomy. No biliary dilatation. Pancreas: Unremarkable. No pancreatic ductal dilatation or surrounding inflammatory changes. Spleen: Normal in size without focal abnormality. Adrenals/Urinary Tract: Stable postsurgical changes from prior right adrenalectomy. Stable 2.1 cm left adrenal adenoma. Kidneys are unremarkable without hydronephrosis. Urinary bladder is incompletely distended, limiting its evaluation. Stomach/Bowel: Mild diffuse colonic wall thickening. No focal pericolonic inflammatory changes. There is also mild wall thickening of the terminal ileum. Appendix not clearly visualized. Postsurgical changes from gastro jejunostomy. No evidence of bowel obstruction. Vascular/Lymphatic: Scattered aortoiliac atherosclerotic calcification. No abdominal or pelvic lymphadenopathy. Reproductive: Status post hysterectomy. No adnexal masses. Other: Stable appearance of extensive soft tissue attenuation within the anterior abdominal wall at and near the umbilicus, compatible with exuberant scarring. No ascites. Musculoskeletal: No acute or significant osseous findings. IMPRESSION: 1. Mild diffuse colonic wall thickening suggestive of a non-specific colitis. Terminal ileum also demonstrates a thickened appearance, which raises the possibility for inflammatory bowel disease. 2.  Chronic postsurgical changes within the abdomen and pelvis, not significantly changed compared to prior study. Electronically Signed   By: Davina Poke M.D.   On: 11/11/2018 10:21   Dg Chest Portable 1 View  Result Date: 11/11/2018 CLINICAL DATA:  Pt c/o abd, n/v/d x 5 days. Hx sickle cell, Lupus, HTN, GERD, smoker EXAM: PORTABLE CHEST 1 VIEW COMPARISON:  Chest radiographs dated 07/18/2017, 05/14/2017 FINDINGS: Stable cardiomediastinal contours within normal limits for [facility] technique. The lungs are clear. No pneumothorax or pleural effusion. Multiple surgical clips  are noted in the right upper quadrant of the abdomen. No acute finding in the visualized skeleton. IMPRESSION: No acute cardiopulmonary process. Electronically Signed   By: Audie Pinto M.D.   On: 11/11/2018 09:22    Micro Results   Recent Results (from the past 240 hour(s))  SARS CORONAVIRUS 2 (TAT 6-12 HRS) Nasal Swab Aptima Multi Swab     Status: None   Collection Time: 11/11/18  1:53 PM   Specimen: Aptima Multi Swab; Nasal Swab  Result Value Ref Range Status   SARS Coronavirus 2 NEGATIVE NEGATIVE Final    Comment: (NOTE) SARS-CoV-2 target nucleic acids are NOT DETECTED. The SARS-CoV-2 RNA is generally detectable in upper and lower respiratory specimens during the acute phase of infection. Negative results do not preclude SARS-CoV-2 infection, do not rule out co-infections with other pathogens, and should not be used as the sole basis for treatment or other patient management decisions. Negative results must be combined with clinical observations, patient history, and epidemiological information. The expected result is Negative. Fact Sheet for Patients: SugarRoll.be Fact Sheet for Healthcare Providers: https://www.woods-mathews.com/ This test is not yet approved or cleared by the [place] FDA and  has been authorized for detection and/or diagnosis of SARS-CoV-2 by FDA  under an Emergency Use Authorization (EUA). This EUA will remain  in effect (meaning this test can be used) for the duration of the COVID-19 declaration under Section 56 4(b)(1) of the Act, 21 U.S.C. section 360bbb-3(b)(1), unless the authorization is terminated or revoked sooner. Performed at [facility], [place] [address], [place], [place] [address]   C difficile quick scan w PCR reflex     Status: None   Collection Time: 11/11/18  8:56 PM   Specimen: Stool  Result Value Ref Range Status   C Diff antigen NEGATIVE NEGATIVE Final   C Diff toxin NEGATIVE NEGATIVE Final   C Diff interpretation No C. difficile detected.  Final    Comment: Performed at [facility], [address] [place], [place] [address]       Today   Subjective    Manaya Berteau today has no new complaints  No fever  Or chills   No Nausea, Vomiting or Diarrhea        Patient has been seen and examined prior to discharge   Objective   Blood pressure 114/78, pulse 60, temperature 98.7 F (37.1 C), temperature source Oral, resp. rate 16, height 5\' 3"  (1.6 m), weight 59 kg, SpO2 97 %.   Intake/Output Summary (Last 24 hours) at 11/12/2018 1521 Last data filed at 11/12/2018 0300 Gross per 24 hour  Intake 2127.5 ml  Output -  Net 2127.5 ml    Exam Gen:- Awake Alert, no acute distress  HEENT:- [place].AT, No sclera icterus Neck-Supple Neck,No JVD,.  Lungs-  CTAB , good air movement bilaterally  CV- S1, S2 normal, regular Abd-  +ve B.Sounds, Abd Soft, much improved tenderness, chronically draining anterior abdominal wounds with mostly clear drainage.  Extremity/Skin:- No  edema,   good pulses Psych-affect is appropriate, oriented x3 Neuro-no new focal deficits, no tremors    Data Review   CBC w Diff:  Lab Results  Component Value Date   WBC 3.9 (L) 11/12/2018   HGB 10.7 (L) 11/12/2018   HCT 30.6 (L) 11/12/2018   HCT 26.3 (L) 10/05/2015   PLT 255 11/12/2018   LYMPHOPCT 44 06/02/2018    BANDSPCT 0 12/10/2013   MONOPCT 6 06/02/2018   EOSPCT 2 06/02/2018  BASOPCT 0 06/02/2018    CMP:  Lab Results  Component Value Date   NA 138 11/11/2018   K 3.3 (L) 11/11/2018   CL 103 11/11/2018   CO2 23 11/11/2018   BUN 9 11/11/2018   CREATININE 0.66 11/11/2018   CREATININE 0.59 03/16/2013   PROT 7.7 11/11/2018   ALBUMIN 4.2 11/11/2018   BILITOT 0.6 11/11/2018   ALKPHOS 80 11/11/2018   AST 31 11/11/2018   ALT 23 11/11/2018  . Total Discharge time is about 33 minutes  Roxan Hockey M.D on 11/12/2018 at 3:21 PM  Go to [email or web address] -  for contact info  [facility] - Office  [phone]

## 2018-11-12 NOTE — Progress Notes (Signed)
Reported 4 loose stools today and nausea.  To be discharged home and follow up with Neil Crouch, PA in September.  IV removed and discharge instructions reviewed with scripts sent to pharmacy.  Friend to drive home

## 2018-11-12 NOTE — Care Management Obs Status (Signed)
[facility] NOTIFICATION   Patient Details  Name: Angel Price MRN: [id number] Date of Birth: [birth year]   Medicare Observation Status Notification Given:  Yes    Boneta Lucks, RN 11/12/2018, 10:59 AM

## 2018-11-12 NOTE — Telephone Encounter (Signed)
Patient needs hospital follow up in 2-3 weeks to schedule for ileocolonoscopy if drug screen negative.

## 2018-11-12 NOTE — Telephone Encounter (Signed)
Patient scheduled.

## 2018-11-12 NOTE — Consult Note (Addendum)
Referring Provider: Roxan Hockey, MD Primary Care Physician:  Rosita Fire, MD Primary Gastroenterologist:  [name], MD  Reason for Consultation:  colitis  HPI: Angel Price is a 53 y.o. 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  Colonoscopy in June 2018 with normal terminal ileum, multiple diverticula, large nonbleeding internal hemorrhoids.  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  EGD February 2019 with normal esophagus, previously placed hemostasis clips remained in place.  No specimens collected.  Previously contemplated a capsule study but agile test was inconclusive.  CT showed mild diffuse colonic wall thickening, terminal ileum with thickened appearance raising possibility for inflammatory bowel disease.  She has been started on IV Cipro/IV Flagyl. Stools studies have been ordered.  C. difficile quick scan was negative.  Urine drug screen cannot be processed due to interfering substance.  Repeat urine drug screen this morning, positive for cocaine.  Patient reports acute onset nausea, vomiting, diarrhea, abdominal pain that started 6 days ago.  She states this was around the time that she used cocaine.  She states cocaine was brought on by her deceased son's birthday.  Continues to drink alcohol about every other day, couple of years.  Denies hematemesis, melena, rectal bleeding.  Prior to onset of symptoms, she had not been having any abdominal pain or bowel concerns.  Denies reflux symptoms.  Patient receiving fentanyl 25 mcg every 4 hours as needed.    Prior to Admission medications   Medication Sig Start Date End Date Taking?  Authorizing Provider  amLODipine (NORVASC) 10 MG tablet Take 0.5 tablets (5 mg total) by mouth daily. 01/03/17  Yes Patrecia Pour, Christean Grief, MD  atenolol (TENORMIN) 50 MG tablet Take 50 mg by mouth daily.   Yes [provider]  escitalopram (LEXAPRO) 20 MG tablet Take 20 mg daily by mouth.   Yes [provider]  lisinopril (PRINIVIL,ZESTRIL) 20 MG tablet Take 1 tablet (20 mg total) by mouth daily. 11/26/16 11/11/18 Yes Reyne Dumas, MD  ondansetron (ZOFRAN ODT) 4 MG disintegrating tablet Take 1 tablet (4 mg total) by mouth every 8 (eight) hours as needed for nausea. 07/01/17  Yes Noemi Chapel, MD    Current Facility-Administered Medications  Medication Dose Route Frequency Provider Last Rate Last Dose  . 0.9 %  sodium chloride infusion  250 mL Intravenous PRN Emokpae, Courage, MD      . acetaminophen (TYLENOL) tablet 650 mg  650 mg Oral Q6H PRN Emokpae, Courage, MD       Or  . acetaminophen (TYLENOL) suppository 650 mg  650 mg Rectal Q6H PRN Emokpae, Courage, MD      . albuterol (PROVENTIL) (2.5 MG/3ML) 0.083% nebulizer solution 2.5 mg  2.5 mg Nebulization Q2H PRN Emokpae, Courage, MD      . amLODipine (NORVASC) tablet 5 mg  5 mg Oral Daily Emokpae, Courage, MD   5 mg at 11/11/18 1544  . atenolol (TENORMIN) tablet 50 mg  50 mg Oral Daily Emokpae, Courage, MD   50 mg at 11/11/18 1544  . ciprofloxacin (CIPRO) IVPB 400 mg  400 mg Intravenous Q12H Emokpae, Courage, MD 200 mL/hr at 11/12/18 0026 400 mg at 11/12/18 0026  . dextrose 5 % and 0.45 % NaCl with KCl 10 mEq/L infusion  Intravenous Continuous Emokpae, Courage, MD      . diphenhydrAMINE (BENADRYL) injection 25 mg  25 mg Intravenous Q8H PRN Denton Brick, Courage, MD   25 mg at 11/11/18 1804  . escitalopram (LEXAPRO) tablet 20 mg  20 mg Oral Daily Emokpae, Courage, MD   20 mg at 11/11/18 1544  . fentaNYL (SUBLIMAZE) injection 25 mcg  25 mcg Intravenous Q4H PRN Roxan Hockey, MD   25 mcg at 11/12/18 0646  . folic acid (FOLVITE)  tablet 1 mg  1 mg Oral Daily Emokpae, Courage, MD   1 mg at 11/11/18 1806  . heparin injection 5,000 Units  5,000 Units Subcutaneous Q8H Roxan Hockey, MD   5,000 Units at 11/12/18 0414  . hydrALAZINE (APRESOLINE) injection 10 mg  10 mg Intravenous Q6H PRN Emokpae, Courage, MD      . lactated ringers infusion   Intravenous Continuous Roxan Hockey, MD 125 mL/hr at 11/12/18 [id number]    . LORazepam (ATIVAN) tablet 1 mg  1 mg Oral Q6H PRN Roxan Hockey, MD   1 mg at 11/11/18 2041   Or  . LORazepam (ATIVAN) injection 1 mg  1 mg Intravenous Q6H PRN Emokpae, Courage, MD      . metroNIDAZOLE (FLAGYL) IVPB 500 mg  500 mg Intravenous Q8H Emokpae, Courage, MD 100 mL/hr at 11/12/18 0414 500 mg at 11/12/18 0414  . multivitamin with minerals tablet 1 tablet  1 tablet Oral Daily Roxan Hockey, MD   1 tablet at 11/11/18 1806  . nicotine (NICODERM CQ - dosed in mg/24 hours) patch 14 mg  14 mg Transdermal Daily Denton Brick, Courage, MD   14 mg at 11/11/18 1806  . ondansetron (ZOFRAN) tablet 4 mg  4 mg Oral Q6H PRN Emokpae, Courage, MD       Or  . ondansetron (ZOFRAN) injection 4 mg  4 mg Intravenous Q6H PRN Denton Brick, Courage, MD   4 mg at 11/12/18 0646  . ondansetron (ZOFRAN-ODT) disintegrating tablet 4 mg  4 mg Oral Q8H PRN Emokpae, Courage, MD      . oxyCODONE (Oxy IR/ROXICODONE) immediate release tablet 5 mg  5 mg Oral Q4H PRN Emokpae, Courage, MD      . pneumococcal 23 valent vaccine (PNU-IMMUNE) injection 0.5 mL  0.5 mL Intramuscular Tomorrow-1000 Emokpae, Courage, MD      . sodium chloride flush (NS) 0.9 % injection 3 mL  3 mL Intravenous Q12H Emokpae, Courage, MD      . sodium chloride flush (NS) 0.9 % injection 3 mL  3 mL Intravenous PRN Emokpae, Courage, MD      . thiamine (VITAMIN B-1) tablet 100 mg  100 mg Oral Daily Emokpae, Courage, MD   100 mg at 11/11/18 1806   Or  . thiamine (B-1) injection 100 mg  100 mg Intravenous Daily Emokpae, Courage, MD      . traZODone (DESYREL) tablet 50 mg  50 mg  Oral QHS PRN Denton Brick, Courage, MD   50 mg at 11/11/18 2042    Allergies as of 11/11/2018 - Review Complete 11/11/2018  Allergen Reaction Noted  . Codeine Hives 02/06/2013  . Morphine and related Hives 02/06/2013  . Reglan [metoclopramide] Other (See Comments) 08/08/2016    Past Medical History:  Diagnosis Date  . Abscess    soft tissue  . Adrenal mass ([place])   . Alcohol abuse   . Anxiety   . Blood transfusion without reported diagnosis   . Chronic abdominal pain   . Chronic wound infection of abdomen   .  Colon polyp    colonoscopy 04/2014  . Depression   . Diverticulosis    colonoscopy 04/2014 moderat pan colonic  . Drug-seeking behavior   . Gastritis    EGD 05/2014  . Gastroparesis Nov 2015  . GERD (gastroesophageal reflux disease)   . Hemorrhoid    internal large  . Hiatal hernia   . History of Billroth II operation   . Hypertension   . Lung nodule    CT 02/2014 needs repeat 1 month  . Lung nodule < 6cm on CT 04/25/2014  . Lupus ([place])   . Malingering   . Nausea and vomiting    chronic, recurrent  . Pancreatitis   . Schatzki's ring    patent per EGD 04/2014  . Sickle cell trait ([place])   . Suicide attempt ([place])   . 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 ABDOMINAL HYSTERECTOMY  2013   [place]  . ABDOMINAL SURGERY    . ADRENALECTOMY Right   . AGILE CAPSULE N/A 01/05/2015   Procedure: AGILE CAPSULE;  Surgeon: Daneil Dolin, MD;  [facility];  Service: Endoscopy;  Laterality: N/A;  0700  . Billroth II procedure      [place], first 2000, 2005/2006.  Marland Kitchen BIOPSY  05/20/2013   Procedure: BIOPSIES OF ASCENDING AND SIGMOID COLON;  Surgeon: Daneil Dolin, MD;  Location: [facility];  Service: Endoscopy;;  . BIOPSY  04/26/2014   Procedure: BIOPSIES;  Surgeon: Danie Binder, MD;  Location: [facility];  Service: Endoscopy;;  . CHOLECYSTECTOMY    . COLONOSCOPY     in [place]  . COLONOSCOPY WITH PROPOFOL N/A  05/20/2013   Dr.Rourk- inadequate prep, normal appearing rectum, grossly normal colon aside from pancolonic diverticula, normal terminal ileum bx= unremarkable colonic mucosa. Due for early interval 2016.   Marland Kitchen COLONOSCOPY WITH PROPOFOL N/A 04/26/2014   [id number] ileum/one colon polyp removed/moderate pan-colonic diverticulosis/large internal hemorrhoids  . COLONOSCOPY WITH PROPOFOL N/A 12/23/2014   Dr.Rourk- minimal internal hemorrhoids, pancolonic diverticulosis  . COLONOSCOPY WITH PROPOFOL N/A 08/23/2016   Procedure: COLONOSCOPY WITH PROPOFOL;  Surgeon: Danie Binder, MD;  [facility];  Service: Endoscopy;  Laterality: N/A;  . DEBRIDEMENT OF ABDOMINAL WALL ABSCESS N/A 02/08/2013   Procedure: DEBRIDEMENT OF ABDOMINAL WALL ABSCESS;  Surgeon: Jamesetta So, MD;  Location: [facility];  Service: General;  Laterality: N/A;  . ESOPHAGOGASTRODUODENOSCOPY (EGD) WITH PROPOFOL N/A 05/20/2013   Dr.Rourk- s/p prior gastric surgery with normal esophagus, residual gastric mucosa and patent efferent limb  . ESOPHAGOGASTRODUODENOSCOPY (EGD) WITH PROPOFOL N/A 02/03/2014   Dr. Gala Romney:  s/p hemigastrectomy with retained gastric contents. Residual gastric mucosa and efferent limb appeared normal otherwise. Query gastroparesis.   Marland Kitchen ESOPHAGOGASTRODUODENOSCOPY (EGD) WITH PROPOFOL N/A 04/26/2014   [id number] ring/small HH/mild non-erosive gasrtitis/normal anastomosis  . ESOPHAGOGASTRODUODENOSCOPY (EGD) WITH PROPOFOL N/A 12/23/2014   Dr.Rourk- s/p prior hemigastrctomy, active oozing from anastomotic suture site, hemostasis achieved  . ESOPHAGOGASTRODUODENOSCOPY (EGD) WITH PROPOFOL N/A 05/23/2016   Dr. 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 ESOPHAGOGASTRODUODENOSCOPY (EGD) WITH PROPOFOL N/A 05/11/2017   Procedure: ESOPHAGOGASTRODUODENOSCOPY (EGD) WITH PROPOFOL;  Surgeon: Daneil Dolin, MD;  [facility];   Service: Endoscopy;  Laterality: N/A;  . HEMORRHOID SURGERY N/A 06/18/2017   Procedure: THREE COLUMN EXTENSIVE HEMORRHOIDECTOMY;  Surgeon: Virl Cagey, MD;  Location: [facility];  Service: General;  Laterality: N/A;  . INCISION AND DRAINAGE ABSCESS N/A 01/06/2017   Procedure: INCISION AND DRAINAGE ABDOMINAL WALL ABSCESS;  Surgeon: Aviva Signs, MD;  Location: [facility];  Service: General;  Laterality: N/A;  . tendon repar Right    wrist  . WOUND EXPLORATION Right 06/24/2013   Procedure: exploration of traumatic wound right wrist;  Surgeon: Tennis Must, MD;  Location: [facility];  Service: Orthopedics;  Laterality: Right;    Family History  Problem Relation Age of Onset  . Brain cancer Son   . Schizophrenia Son   . Cancer Son        brain  . Lung cancer Father   . Cancer Father        mets  . Drug abuse Mother   . Breast cancer Maternal Aunt   . Bipolar disorder Maternal Aunt   . Drug abuse Maternal Aunt   . Colon cancer Maternal Grandmother        late 49s, early 64s  . Drug abuse Sister   . Drug abuse Brother   . Bipolar disorder Paternal Grandfather   . Bipolar disorder Cousin   . Liver disease Neg Hx     Social History   Socioeconomic History  . Marital status: Legally Separated    Spouse name: Not on file  . Number of children: Not on file  . Years of education: Not on file  . Highest education level: Not on file  Occupational History  . Not on file  Social Needs  . Financial resource strain: Not on file  . Food insecurity    Worry: Not on file    Inability: Not on file  . Transportation needs    Medical: Not on file    Non-medical: Not on file  Tobacco Use  . Smoking status: Current Every Day Smoker    Packs/day: 0.50    Years: 35.00    Pack years: 17.50    Types: Cigarettes  . Smokeless tobacco: Never Used  Substance and Sexual Activity  . Alcohol use: Yes    Alcohol/week: 0.0 standard drinks    Comment: 2-3 cans every other day  . Drug use: No    Types:  Cocaine    Comment: last used 4 days ago on 11/06/18  . Sexual activity: Yes    Birth control/protection: Surgical  Lifestyle  . Physical activity    Days per week: Not on file    Minutes per session: Not on file  . Stress: Not on file  Relationships  . Social Herbalist on phone: Not on file    Gets together: Not on file    Attends religious service: Not on file    Active member of club or organization: Not on file    Attends meetings of clubs or organizations: Not on file    Relationship status: Not on file  . Intimate partner violence    Fear of current or ex partner: Not on file    Emotionally abused: Not on file    Physically abused: Not on file    Forced sexual activity: Not on file  Other Topics Concern  . Not on file  Social History Narrative  . Not on file     ROS:  General: Negative for anorexia, weight loss, fever, chills, fatigue, weakness. Eyes: Negative for vision changes.  ENT: Negative for hoarseness, difficulty swallowing , nasal congestion. CV: Negative for chest pain, angina, palpitations, dyspnea on exertion, peripheral  edema.  Respiratory: Negative for dyspnea at rest, dyspnea on exertion, cough, sputum, wheezing.  GI: See history of present illness. GU:  Negative for dysuria, hematuria, urinary incontinence, urinary frequency, nocturnal urination.  MS: Negative for joint pain, low back pain.  Derm: Negative for rash or itching.  Neuro: Negative for weakness, abnormal sensation, seizure, frequent headaches, memory loss, confusion.  Psych: Negative for anxiety, depression, suicidal ideation, hallucinations.  Endo: Negative for unusual weight change.  Heme: Negative for bruising or bleeding. Allergy: Negative for rash or hives.       Physical Examination: Vital signs in last 24 hours: Temp:  [97.8 F (36.6 C)-99 F (37.2 C)] 97.8 F (36.6 C) (08/27 0256) Pulse Rate:  [60-105] 60 (08/27 0256) Resp:  [15-20] 18 (08/27 0256) BP:  (102-151)/(74-117) 124/91 (08/27 0256) SpO2:  [89 %-99 %] 96 % (08/27 0256) Weight:  [59 kg] 59 kg (08/26 0816) Last BM Date: 11/11/18  General: Well-nourished, well-developed in no acute distress.  Head: Normocephalic, atraumatic.   Eyes: Conjunctiva pink, no icterus. Mouth: Oropharyngeal mucosa moist and pink , no lesions erythema or exudate. Neck: Supple without thyromegaly, masses, or lymphadenopathy.  Lungs: Clear to auscultation bilaterally.  Heart: Regular rate and rhythm, no murmurs rubs or gallops.  Abdomen: Bowel sounds are normal, moderate lower abdominal tenderness. Three actively draining (serosangenous) fistulas around umbilicus, nondistended, no hepatosplenomegaly or masses, no abdominal bruits or    hernia , no rebound or guarding.   Rectal: not performed Extremities: No lower extremity edema, clubbing, deformity.  Neuro: Alert and oriented x 4 , grossly normal neurologically.  Skin: Warm and dry, no rash or jaundice.   Psych: Alert and cooperative, normal mood and affect.        Intake/Output from previous day: 08/26 0701 - 08/27 0700 In: 2127.5 [I.V.:1827.1; IV Piggyback:300.4] Out: -  Intake/Output this shift: No intake/output data recorded.  Lab Results: CBC Recent Labs    11/11/18 0846 11/12/18 0538  WBC 5.4 3.9*  HGB 13.3 10.7*  HCT 37.3 30.6*  MCV 82.5 84.5  PLT 303 255   BMET Recent Labs    11/11/18 0846  NA 138  K 3.3*  CL 103  CO2 23  GLUCOSE 103*  BUN 9  CREATININE 0.66  CALCIUM 8.7*   LFT Recent Labs    11/11/18 0846  BILITOT 0.6  ALKPHOS 80  AST 31  ALT 23  PROT 7.7  ALBUMIN 4.2    Lipase Recent Labs    11/11/18 0846  LIPASE 12    PT/INR No results for input(s): LABPROT, INR in the last 72 hours.    Imaging Studies: Ct Abdomen Pelvis W Contrast  Result Date: 11/11/2018 CLINICAL DATA:  Generalized abdominal pain, nausea, vomiting EXAM: CT ABDOMEN AND PELVIS WITH CONTRAST TECHNIQUE: Multidetector CT imaging of the  abdomen and pelvis was performed using the standard protocol following bolus administration of intravenous contrast. CONTRAST:  189mL OMNIPAQUE IOHEXOL 300 MG/ML  SOLN COMPARISON:  06/02/2018 FINDINGS: Lower chest: No acute abnormality. Hepatobiliary: Mildly decreased attenuation of the hepatic parenchyma suggesting fatty infiltration. Lesion. Status post cholecystectomy. No biliary dilatation. Pancreas: Unremarkable. No pancreatic ductal dilatation or surrounding inflammatory changes. Spleen: Normal in size without focal abnormality. Adrenals/Urinary Tract: Stable postsurgical changes from prior right adrenalectomy. Stable 2.1 cm left adrenal adenoma. Kidneys are unremarkable without hydronephrosis. Urinary bladder is incompletely distended, limiting its evaluation. Stomach/Bowel: Mild diffuse colonic wall thickening. No focal pericolonic inflammatory changes. There is also mild wall thickening of the terminal ileum.  Appendix not clearly visualized. Postsurgical changes from gastro jejunostomy. No evidence of bowel obstruction. Vascular/Lymphatic: Scattered aortoiliac atherosclerotic calcification. No abdominal or pelvic lymphadenopathy. Reproductive: Status post hysterectomy. No adnexal masses. Other: Stable appearance of extensive soft tissue attenuation within the anterior abdominal wall at and near the umbilicus, compatible with exuberant scarring. No ascites. Musculoskeletal: No acute or significant osseous findings. IMPRESSION: 1. Mild diffuse colonic wall thickening suggestive of a non-specific colitis. Terminal ileum also demonstrates a thickened appearance, which raises the possibility for inflammatory bowel disease. 2. Chronic postsurgical changes within the abdomen and pelvis, not significantly changed compared to prior study. Electronically Signed   By: Davina Poke M.D.   On: 11/11/2018 10:21   Dg Chest Portable 1 View  Result Date: 11/11/2018 CLINICAL DATA:  Pt c/o abd, n/v/d x 5 days. Hx  sickle cell, Lupus, HTN, GERD, smoker EXAM: PORTABLE CHEST 1 VIEW COMPARISON:  Chest radiographs dated 07/18/2017, 05/14/2017 FINDINGS: Stable cardiomediastinal contours within normal limits for [facility] technique. The lungs are clear. No pneumothorax or pleural effusion. Multiple surgical clips are noted in the right upper quadrant of the abdomen. No acute finding in the visualized skeleton. IMPRESSION: No acute cardiopulmonary process. Electronically Signed   By: Audie Pinto M.D.   On: 11/11/2018 09:22  [4 week]   Impression: Pleasant 53 year old female with complicated GI history as outlined above presenting with acute onset nausea, vomiting, diarrhea in the setting of cocaine use.  CT with mild nonspecific pancolitis.  Terminal ileum with some thickened appearance as well raising the possibility for inflammatory bowel disease.  Currently on antibiotics for possible infectious etiology.  Patient reports ongoing emesis.  No bowel movement since admission.  Chronic abdominal wall fistula, actively draining clear fluid.  Patient reports constant drainage for the past couple of years.  No evidence of enterocutaneous fistulas on CT imaging. Significant abdominal wall scarring. No evidence of IBD on colonoscopy in 2018.   Plan: 1. Patient likely needs ileocolonoscopy to rule out IBD.  Unfortunately, positive urine drug screen for cocaine complicates this as far as anesthesia options.  Will likely need to pursue as an outpatient in the setting of a negative drug screen.  To discuss with Dr. Gala Romney.  We would like to thank you for the opportunity to participate in the care of Angel Price.  Laureen Ochs. Bernarda Caffey [facility] [phone] 8/27/20209:26 AM     LOS: 0 days     Addendum: Discussed with Dr. Gala Romney. We will offer patient follow up in our office in a few weeks. We will plan for outpatient ileocolonoscopy if she is able to have a clean urine drug screen.  Complete antibiotic course for possible infectious colitis, although cannot exclude symptoms secondary to cocaine use given timeframe of symptom onset. Updated Dr. Denton Brick on plan.   Laureen Ochs. Bernarda Caffey [facility] [phone] 8/27/202011:40 AM

## 2018-11-12 NOTE — Discharge Instructions (Signed)
1) Please do NOT drink alcohol while taking Flagyl/antibiotics for your colitis as you will get violently ill 2)Follow-up with gastroenterologist as outpatient in a couple of weeks  to schedule  ileocolonoscopy procedure if your urine drug screen negative. 3) Abstinence from cocaine, tobacco and alcohol strongly advised 4) Repeat complete blood count/CBC and repeat BMP test within a week with a primary care provider strongly advised 5) please take ciprofloxacin and metronidazole antibiotics as prescribed for 1 week

## 2018-11-15 DIAGNOSIS — Z9049 Acquired absence of other specified parts of digestive tract: Secondary | ICD-10-CM | POA: Diagnosis not present

## 2018-11-15 DIAGNOSIS — K219 Gastro-esophageal reflux disease without esophagitis: Secondary | ICD-10-CM | POA: Diagnosis not present

## 2018-11-15 DIAGNOSIS — R1110 Vomiting, unspecified: Secondary | ICD-10-CM | POA: Diagnosis not present

## 2018-11-15 DIAGNOSIS — I10 Essential (primary) hypertension: Secondary | ICD-10-CM | POA: Diagnosis not present

## 2018-11-15 DIAGNOSIS — M329 Systemic lupus erythematosus, unspecified: Secondary | ICD-10-CM | POA: Diagnosis not present

## 2018-11-15 DIAGNOSIS — L02211 Cutaneous abscess of abdominal wall: Secondary | ICD-10-CM | POA: Diagnosis not present

## 2018-11-15 DIAGNOSIS — R112 Nausea with vomiting, unspecified: Secondary | ICD-10-CM | POA: Diagnosis not present

## 2018-11-15 DIAGNOSIS — R197 Diarrhea, unspecified: Secondary | ICD-10-CM | POA: Diagnosis not present

## 2018-11-15 DIAGNOSIS — Z79899 Other long term (current) drug therapy: Secondary | ICD-10-CM | POA: Diagnosis not present

## 2018-11-17 DIAGNOSIS — K529 Noninfective gastroenteritis and colitis, unspecified: Secondary | ICD-10-CM | POA: Diagnosis not present

## 2018-11-17 DIAGNOSIS — S31100D Unspecified open wound of abdominal wall, right upper quadrant without penetration into peritoneal cavity, subsequent encounter: Secondary | ICD-10-CM | POA: Diagnosis not present

## 2018-11-17 DIAGNOSIS — L930 Discoid lupus erythematosus: Secondary | ICD-10-CM | POA: Diagnosis not present

## 2018-11-17 DIAGNOSIS — Z23 Encounter for immunization: Secondary | ICD-10-CM | POA: Diagnosis not present

## 2018-11-17 DIAGNOSIS — E785 Hyperlipidemia, unspecified: Secondary | ICD-10-CM | POA: Diagnosis not present

## 2018-11-17 DIAGNOSIS — I10 Essential (primary) hypertension: Secondary | ICD-10-CM | POA: Diagnosis not present

## 2018-12-03 DIAGNOSIS — I10 Essential (primary) hypertension: Secondary | ICD-10-CM | POA: Diagnosis not present

## 2018-12-03 DIAGNOSIS — Z0001 Encounter for general adult medical examination with abnormal findings: Secondary | ICD-10-CM | POA: Diagnosis not present

## 2018-12-03 DIAGNOSIS — L930 Discoid lupus erythematosus: Secondary | ICD-10-CM | POA: Diagnosis not present

## 2018-12-03 DIAGNOSIS — Z1389 Encounter for screening for other disorder: Secondary | ICD-10-CM | POA: Diagnosis not present

## 2018-12-03 DIAGNOSIS — K210 Gastro-esophageal reflux disease with esophagitis: Secondary | ICD-10-CM | POA: Diagnosis not present

## 2018-12-07 ENCOUNTER — Other Ambulatory Visit (HOSPITAL_COMMUNITY): Payer: Self-pay | Admitting: Internal Medicine

## 2018-12-07 DIAGNOSIS — Z1231 Encounter for screening mammogram for malignant neoplasm of breast: Secondary | ICD-10-CM

## 2018-12-09 ENCOUNTER — Other Ambulatory Visit: Payer: Self-pay | Admitting: *Deleted

## 2018-12-09 ENCOUNTER — Telehealth: Payer: Self-pay | Admitting: *Deleted

## 2018-12-09 ENCOUNTER — Other Ambulatory Visit: Payer: Self-pay

## 2018-12-09 ENCOUNTER — Encounter: Payer: Self-pay | Admitting: Gastroenterology

## 2018-12-09 ENCOUNTER — Ambulatory Visit: Payer: Medicare Other | Admitting: Gastroenterology

## 2018-12-09 ENCOUNTER — Encounter: Payer: Self-pay | Admitting: *Deleted

## 2018-12-09 VITALS — BP 129/91 | HR 73 | Temp 95.9°F | Ht 63.0 in | Wt 134.2 lb

## 2018-12-09 DIAGNOSIS — R197 Diarrhea, unspecified: Secondary | ICD-10-CM | POA: Diagnosis not present

## 2018-12-09 DIAGNOSIS — R933 Abnormal findings on diagnostic imaging of other parts of digestive tract: Secondary | ICD-10-CM

## 2018-12-09 MED ORDER — PEG 3350-KCL-NA BICARB-NACL 420 G PO SOLR: 4000.0000 mL | Freq: Once | ORAL | 0 refills | Status: AC

## 2018-12-09 MED ORDER — DICYCLOMINE HCL 10 MG PO CAPS: 10.0000 mg | ORAL_CAPSULE | Freq: Three times a day (TID) | ORAL | 1 refills | Status: DC

## 2018-12-09 NOTE — Progress Notes (Signed)
Primary Care Physician:  Rosita Fire, MD  Primary Gastroenterologist:  Garfield Cornea, MD   Chief Complaint  Patient presents with  . Diarrhea    HPI:  Angel Price is a 53 y.o. female here for hospital follow-up.  She was seen back in August during hospitalization presenting with intractable vomiting, diarrhea and abdominal pain.  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  Colonoscopy in June 2018 with normal terminal ileum, multiple diverticula, large nonbleeding internal hemorrhoids.  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  EGD February 2019 with normal esophagus, previously placed hemostasis clips remained in place.  No specimens collected.  Previously contemplated a capsule study but agile test was inconclusive.  When she presented to the ED back in August she had a CT that showed mild diffuse colonic wall thickening, terminal ileum with thickened appearance raising the possibility of inflammatory bowel disease.  C. difficile quick scan was negative.  She was given Cipro and Flagyl.  Because of positive drug screen for cocaine, plans were made for outpatient colonoscopy.  Since being discharged, patient's diarrhea has remained unchanged.  No benefit from antibiotics.  Having greater than 10 watery stools daily.  Continues to have abdominal pain.  No blood in the stool.  Significant postprandial abdominal cramping and urgency.  She wakes up nauseated, continues to have intermittent vomiting.  Denies any drug use since admission.  Consuming a couple of 12 ounce beers daily on the weekends only.  She has tried medicating with Imodium and Pepto for diarrhea but no improvement.  She avoids milk due to lactose intolerance.  Denies  heartburn.    Current Outpatient Medications  Medication Sig Dispense Refill  . amLODipine (NORVASC) 10 MG tablet Take 0.5 tablets (5 mg total) by mouth daily. 30 tablet 0  . atenolol (TENORMIN) 25 MG tablet Take 1 tablet (25 mg total) by mouth daily. 30 tablet 2  . escitalopram (LEXAPRO) 20 MG tablet Take 20 mg daily by mouth.    . folic acid (FOLVITE) 1 MG tablet Take 1 tablet (1 mg total) by mouth daily. 30 tablet 2  . ondansetron (ZOFRAN ODT) 4 MG disintegrating tablet Take 1 tablet (4 mg total) by mouth every 8 (eight) hours as needed for nausea. 10 tablet 0  . thiamine 100 MG tablet Take 1 tablet (100 mg total) by mouth daily. 30 tablet 2   No current facility-administered medications for this visit.     Allergies as of 12/09/2018 - Review Complete 12/09/2018  Allergen Reaction Noted  . Codeine Hives 02/06/2013  . Morphine and related Hives 02/06/2013  . Reglan [metoclopramide] Other (See Comments) 08/08/2016    Past Medical History:  Diagnosis Date  . Abscess    soft tissue  . Adrenal mass ([place])   . Alcohol abuse   . Anxiety   . Blood transfusion without reported diagnosis   . Chronic abdominal pain   . Chronic wound infection of abdomen   . Colon polyp    colonoscopy 04/2014  . Depression   . Diverticulosis    colonoscopy 04/2014 moderat pan colonic  . Drug-seeking behavior   . Gastritis    EGD 05/2014  . Gastroparesis Nov 2015  . GERD (gastroesophageal reflux disease)   . Hemorrhoid    internal large  . Hiatal hernia   .  History of Billroth II operation   . Hypertension   . Lung nodule    CT 02/2014 needs repeat 1 month  . Lung nodule < 6cm on CT 04/25/2014  . Lupus ([place])   . Malingering   . Nausea and vomiting    chronic, recurrent  . Pancreatitis   . Schatzki's ring    patent per EGD 04/2014  . Sickle cell trait ([place])   . Suicide attempt ([place])   . 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  ABDOMINAL HYSTERECTOMY  2013   [place]  . ABDOMINAL SURGERY    . ADRENALECTOMY Right   . AGILE CAPSULE N/A 01/05/2015   Procedure: AGILE CAPSULE;  Surgeon: Daneil Dolin, MD;  Location[facility];  Service: Endoscopy;  Laterality: N/A;  0700  . Billroth II procedure      [place], first 2000, 2005/2006.  Marland Kitchen BIOPSY  05/20/2013   Procedure: BIOPSIES OF ASCENDING AND SIGMOID COLON;  Surgeon: Daneil Dolin, MD;  Location: [facility];  Service: Endoscopy;;  . BIOPSY  04/26/2014   Procedure: BIOPSIES;  Surgeon: Danie Binder, MD;  Location: [facility];  Service: Endoscopy;;  . CHOLECYSTECTOMY    . COLONOSCOPY     in [place]  . COLONOSCOPY WITH PROPOFOL N/A 05/20/2013   Dr.Rourk- inadequate prep, normal appearing rectum, grossly normal colon aside from pancolonic diverticula, normal terminal ileum bx= unremarkable colonic mucosa. Due for early interval 2016.   Marland Kitchen COLONOSCOPY WITH PROPOFOL N/A 04/26/2014   [id number] ileum/one colon polyp removed/moderate pan-colonic diverticulosis/large internal hemorrhoids  . COLONOSCOPY WITH PROPOFOL N/A 12/23/2014   Dr.Rourk- minimal internal hemorrhoids, pancolonic diverticulosis  . COLONOSCOPY WITH PROPOFOL N/A 08/23/2016   Procedure: COLONOSCOPY WITH PROPOFOL;  Surgeon: Danie Binder, MD;  [facility];  Service: Endoscopy;  Laterality: N/A;  . DEBRIDEMENT OF ABDOMINAL WALL ABSCESS N/A 02/08/2013   Procedure: DEBRIDEMENT OF ABDOMINAL WALL ABSCESS;  Surgeon: Jamesetta So, MD;  Location: [facility];  Service: General;  Laterality: N/A;  . ESOPHAGOGASTRODUODENOSCOPY (EGD) WITH PROPOFOL N/A 05/20/2013   Dr.Rourk- s/p prior gastric surgery with normal esophagus, residual gastric mucosa and patent efferent limb  . ESOPHAGOGASTRODUODENOSCOPY (EGD) WITH PROPOFOL N/A 02/03/2014   Dr. Gala Romney:  s/p hemigastrectomy with retained gastric contents. Residual gastric mucosa and efferent limb appeared normal otherwise. Query gastroparesis.   Marland Kitchen ESOPHAGOGASTRODUODENOSCOPY (EGD)  WITH PROPOFOL N/A 04/26/2014   [id number] ring/small HH/mild non-erosive gasrtitis/normal anastomosis  . ESOPHAGOGASTRODUODENOSCOPY (EGD) WITH PROPOFOL N/A 12/23/2014   Dr.Rourk- s/p prior hemigastrctomy, active oozing from anastomotic suture site, hemostasis achieved  . ESOPHAGOGASTRODUODENOSCOPY (EGD) WITH PROPOFOL N/A 05/23/2016   Dr. 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 ESOPHAGOGASTRODUODENOSCOPY (EGD) WITH PROPOFOL N/A 05/11/2017   Procedure: ESOPHAGOGASTRODUODENOSCOPY (EGD) WITH PROPOFOL;  Surgeon: Daneil Dolin, MD;  [facility];  Service: Endoscopy;  Laterality: N/A;  . HEMORRHOID SURGERY N/A 06/18/2017   Procedure: THREE COLUMN EXTENSIVE HEMORRHOIDECTOMY;  Surgeon: Virl Cagey, MD;  Location: [facility];  Service: General;  Laterality: N/A;  . INCISION AND DRAINAGE ABSCESS N/A 01/06/2017   Procedure: INCISION AND DRAINAGE ABDOMINAL WALL ABSCESS;  Surgeon: Aviva Signs, MD;  Location: [facility];  Service: General;  Laterality: N/A;  . tendon repar Right    wrist  . WOUND EXPLORATION Right 06/24/2013   Procedure: exploration of traumatic wound right wrist;  Surgeon: Tennis Must, MD;  Location: [place];  Service: Orthopedics;  Laterality: Right;    Family History  Problem Relation Age of Onset  . Brain cancer Son   . Schizophrenia Son   . Cancer Son        brain  . Lung cancer Father   . Cancer Father        mets  . Drug abuse Mother   . Breast cancer Maternal Aunt   . Bipolar disorder Maternal Aunt   . Drug abuse Maternal Aunt   . Colon cancer Maternal Grandmother        late 53s, early 23s  . Drug abuse Sister   . Drug abuse Brother   . Bipolar disorder Paternal Grandfather   . Bipolar disorder Cousin   . Liver disease Neg Hx     Social History   Socioeconomic History  . Marital status: Legally Separated    Spouse name: Not on file  . Number of children:  Not on file  . Years of education: Not on file  . Highest education level: Not on file  Occupational History  . Not on file  Social Needs  . Financial resource strain: Not on file  . Food insecurity    Worry: Not on file    Inability: Not on file  . Transportation needs    Medical: Not on file    Non-medical: Not on file  Tobacco Use  . Smoking status: Current Every Day Smoker    Packs/day: 0.50    Years: 35.00    Pack years: 17.50    Types: Cigarettes  . Smokeless tobacco: Never Used  Substance and Sexual Activity  . Alcohol use: Not Currently    Alcohol/week: 0.0 standard drinks    Comment: 2-3 cans every other day; denied 12/09/18  . Drug use: No    Types: Cocaine    Comment: last used 4 days ago on 11/06/18; denied 12/09/18  . Sexual activity: Yes    Birth control/protection: Surgical  Lifestyle  . Physical activity    Days per week: Not on file    Minutes per session: Not on file  . Stress: Not on file  Relationships  . Social Herbalist on phone: Not on file    Gets together: Not on file    Attends religious service: Not on file    Active member of club or organization: Not on file    Attends meetings of clubs or organizations: Not on file    Relationship status: Not on file  . Intimate partner violence    Fear of current or ex partner: Not on file    Emotionally abused: Not on file    Physically abused: Not on file    Forced sexual activity: Not on file  Other Topics Concern  . Not on file  Social History Narrative  . Not on file      ROS:  General: Negative for anorexia, weight loss, fever, chills, fatigue, weakness. Eyes: Negative for vision changes.  ENT: Negative for hoarseness, difficulty swallowing , nasal congestion. CV: Negative for chest pain, angina, palpitations, dyspnea on exertion, peripheral edema.  Respiratory: Negative for dyspnea at rest, dyspnea on exertion, cough, sputum, wheezing.  GI: See history of present illness. GU:   Negative for dysuria, hematuria, urinary incontinence, urinary frequency, nocturnal urination.  MS: Negative for joint pain, low back pain.  Derm: Negative for rash or itching.  Neuro: Negative for weakness, abnormal  sensation, seizure, frequent headaches, memory loss, confusion.  Psych: Negative for anxiety, depression, suicidal ideation, hallucinations.  Endo: Negative for unusual weight change.  Heme: Negative for bruising or bleeding. Allergy: Negative for rash or hives.    Physical Examination:  BP (!) 129/91   Pulse 73   Temp (!) 95.9 F (35.5 C) (Temporal)   Ht 5\' 3"  (1.6 m)   Wt 134 lb 3.2 oz (60.9 kg)   BMI 23.77 kg/m    General: Well-nourished, well-developed in no acute distress.  Head: Normocephalic, atraumatic.   Eyes: Conjunctiva pink, no icterus. Mouth: Oropharyngeal mucosa moist and pink , no lesions erythema or exudate. Neck: Supple without thyromegaly, masses, or lymphadenopathy.  Lungs: Clear to auscultation bilaterally.  Heart: Regular rate and rhythm, no murmurs rubs or gallops.  Abdomen: Bowel sounds are normal, nondistended, no hepatosplenomegaly or masses, no abdominal bruits or    hernia , no rebound or guarding.  Mild diffuse tenderness.  Active draining from fistula on the right upper abdomen, light brown clear liquid. Rectal: Deferred Extremities: No lower extremity edema. No clubbing or deformities.  Neuro: Alert and oriented x 4 , grossly normal neurologically.  Skin: Warm and dry, no rash or jaundice.   Psych: Alert and cooperative, normal mood and affect.  Labs: Lab Results  Component Value Date   CREATININE 0.66 11/11/2018   BUN 9 11/11/2018   NA 138 11/11/2018   K 3.3 (L) 11/11/2018   CL 103 11/11/2018   CO2 23 11/11/2018   Lab Results  Component Value Date   ALT 23 11/11/2018   AST 31 11/11/2018   ALKPHOS 80 11/11/2018   BILITOT 0.6 11/11/2018   Lab Results  Component Value Date   WBC 3.9 (L) 11/12/2018   HGB 10.7 (L)  11/12/2018   HCT 30.6 (L) 11/12/2018   MCV 84.5 11/12/2018   PLT 255 11/12/2018   Lab Results  Component Value Date   LIPASE 12 11/11/2018     Imaging Studies: Ct Abdomen Pelvis W Contrast  Result Date: 11/11/2018 CLINICAL DATA:  Generalized abdominal pain, nausea, vomiting EXAM: CT ABDOMEN AND PELVIS WITH CONTRAST TECHNIQUE: Multidetector CT imaging of the abdomen and pelvis was performed using the standard protocol following bolus administration of intravenous contrast. CONTRAST:  137mL OMNIPAQUE IOHEXOL 300 MG/ML  SOLN COMPARISON:  06/02/2018 FINDINGS: Lower chest: No acute abnormality. Hepatobiliary: Mildly decreased attenuation of the hepatic parenchyma suggesting fatty infiltration. Lesion. Status post cholecystectomy. No biliary dilatation. Pancreas: Unremarkable. No pancreatic ductal dilatation or surrounding inflammatory changes. Spleen: Normal in size without focal abnormality. Adrenals/Urinary Tract: Stable postsurgical changes from prior right adrenalectomy. Stable 2.1 cm left adrenal adenoma. Kidneys are unremarkable without hydronephrosis. Urinary bladder is incompletely distended, limiting its evaluation. Stomach/Bowel: Mild diffuse colonic wall thickening. No focal pericolonic inflammatory changes. There is also mild wall thickening of the terminal ileum. Appendix not clearly visualized. Postsurgical changes from gastro jejunostomy. No evidence of bowel obstruction. Vascular/Lymphatic: Scattered aortoiliac atherosclerotic calcification. No abdominal or pelvic lymphadenopathy. Reproductive: Status post hysterectomy. No adnexal masses. Other: Stable appearance of extensive soft tissue attenuation within the anterior abdominal wall at and near the umbilicus, compatible with exuberant scarring. No ascites. Musculoskeletal: No acute or significant osseous findings. IMPRESSION: 1. Mild diffuse colonic wall thickening suggestive of a non-specific colitis. Terminal ileum also demonstrates a  thickened appearance, which raises the possibility for inflammatory bowel disease. 2. Chronic postsurgical changes within the abdomen and pelvis, not significantly changed compared to prior study. Electronically Signed   By:  Davina Poke M.D.   On: 11/11/2018 10:21   Dg Chest Portable 1 View  Result Date: 11/11/2018 CLINICAL DATA:  Pt c/o abd, n/v/d x 5 days. Hx sickle cell, Lupus, HTN, GERD, smoker EXAM: PORTABLE CHEST 1 VIEW COMPARISON:  Chest radiographs dated 07/18/2017, 05/14/2017 FINDINGS: Stable cardiomediastinal contours within normal limits for [facility] technique. The lungs are clear. No pneumothorax or pleural effusion. Multiple surgical clips are noted in the right upper quadrant of the abdomen. No acute finding in the visualized skeleton. IMPRESSION: No acute cardiopulmonary process. Electronically Signed   By: Audie Pinto M.D.   On: 11/11/2018 09:22

## 2018-12-09 NOTE — H&P (View-Only) (Signed)
Primary Care Physician:  Rosita Fire, MD  Primary Gastroenterologist:  Garfield Cornea, MD   Chief Complaint  Patient presents with  . Diarrhea    HPI:  Angel Price is a 53 y.o. female here for hospital follow-up.  She was seen back in August during hospitalization presenting with intractable vomiting, diarrhea and abdominal pain.  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  Colonoscopy in June 2018 with normal terminal ileum, multiple diverticula, large nonbleeding internal hemorrhoids.  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  EGD February 2019 with normal esophagus, previously placed hemostasis clips remained in place.  No specimens collected.  Previously contemplated a capsule study but agile test was inconclusive.  When she presented to the ED back in August she had a CT that showed mild diffuse colonic wall thickening, terminal ileum with thickened appearance raising the possibility of inflammatory bowel disease.  C. difficile quick scan was negative.  She was given Cipro and Flagyl.  Because of positive drug screen for cocaine, plans were made for outpatient colonoscopy.  Since being discharged, patient's diarrhea has remained unchanged.  No benefit from antibiotics.  Having greater than 10 watery stools daily.  Continues to have abdominal pain.  No blood in the stool.  Significant postprandial abdominal cramping and urgency.  She wakes up nauseated, continues to have intermittent vomiting.  Denies any drug use since admission.  Consuming a couple of 12 ounce beers daily on the weekends only.  She has tried medicating with Imodium and Pepto for diarrhea but no improvement.  She avoids milk due to lactose intolerance.  Denies  heartburn.    Current Outpatient Medications  Medication Sig Dispense Refill  . amLODipine (NORVASC) 10 MG tablet Take 0.5 tablets (5 mg total) by mouth daily. 30 tablet 0  . atenolol (TENORMIN) 25 MG tablet Take 1 tablet (25 mg total) by mouth daily. 30 tablet 2  . escitalopram (LEXAPRO) 20 MG tablet Take 20 mg daily by mouth.    . folic acid (FOLVITE) 1 MG tablet Take 1 tablet (1 mg total) by mouth daily. 30 tablet 2  . ondansetron (ZOFRAN ODT) 4 MG disintegrating tablet Take 1 tablet (4 mg total) by mouth every 8 (eight) hours as needed for nausea. 10 tablet 0  . thiamine 100 MG tablet Take 1 tablet (100 mg total) by mouth daily. 30 tablet 2   No current facility-administered medications for this visit.     Allergies as of 12/09/2018 - Review Complete 12/09/2018  Allergen Reaction Noted  . Codeine Hives 02/06/2013  . Morphine and related Hives 02/06/2013  . Reglan [metoclopramide] Other (See Comments) 08/08/2016    Past Medical History:  Diagnosis Date  . Abscess    soft tissue  . Adrenal mass ([place])   . Alcohol abuse   . Anxiety   . Blood transfusion without reported diagnosis   . Chronic abdominal pain   . Chronic wound infection of abdomen   . Colon polyp    colonoscopy 04/2014  . Depression   . Diverticulosis    colonoscopy 04/2014 moderat pan colonic  . Drug-seeking behavior   . Gastritis    EGD 05/2014  . Gastroparesis Nov 2015  . GERD (gastroesophageal reflux disease)   . Hemorrhoid    internal large  . Hiatal hernia   .  History of Billroth II operation   . Hypertension   . Lung nodule    CT 02/2014 needs repeat 1 month  . Lung nodule < 6cm on CT 04/25/2014  . Lupus ([place])   . Malingering   . Nausea and vomiting    chronic, recurrent  . Pancreatitis   . Schatzki's ring    patent per EGD 04/2014  . Sickle cell trait ([place])   . Suicide attempt ([place])   . 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  ABDOMINAL HYSTERECTOMY  2013   [place]  . ABDOMINAL SURGERY    . ADRENALECTOMY Right   . AGILE CAPSULE N/A 01/05/2015   Procedure: AGILE CAPSULE;  Surgeon: Daneil Dolin, MD;  [facility];  Service: Endoscopy;  Laterality: N/A;  0700  . Billroth II procedure      [place], first 2000, 2005/2006.  Marland Kitchen BIOPSY  05/20/2013   Procedure: BIOPSIES OF ASCENDING AND SIGMOID COLON;  Surgeon: Daneil Dolin, MD;  Location: [facility];  Service: Endoscopy;;  . BIOPSY  04/26/2014   Procedure: BIOPSIES;  Surgeon: Danie Binder, MD;  Location: [facility];  Service: Endoscopy;;  . CHOLECYSTECTOMY    . COLONOSCOPY     in [place]  . COLONOSCOPY WITH PROPOFOL N/A 05/20/2013   Dr.Rourk- inadequate prep, normal appearing rectum, grossly normal colon aside from pancolonic diverticula, normal terminal ileum bx= unremarkable colonic mucosa. Due for early interval 2016.   Marland Kitchen COLONOSCOPY WITH PROPOFOL N/A 04/26/2014   [id number] ileum/one colon polyp removed/moderate pan-colonic diverticulosis/large internal hemorrhoids  . COLONOSCOPY WITH PROPOFOL N/A 12/23/2014   Dr.Rourk- minimal internal hemorrhoids, pancolonic diverticulosis  . COLONOSCOPY WITH PROPOFOL N/A 08/23/2016   Procedure: COLONOSCOPY WITH PROPOFOL;  Surgeon: Danie Binder, MD;  [facility];  Service: Endoscopy;  Laterality: N/A;  . DEBRIDEMENT OF ABDOMINAL WALL ABSCESS N/A 02/08/2013   Procedure: DEBRIDEMENT OF ABDOMINAL WALL ABSCESS;  Surgeon: Jamesetta So, MD;  Location: [facility];  Service: General;  Laterality: N/A;  . ESOPHAGOGASTRODUODENOSCOPY (EGD) WITH PROPOFOL N/A 05/20/2013   Dr.Rourk- s/p prior gastric surgery with normal esophagus, residual gastric mucosa and patent efferent limb  . ESOPHAGOGASTRODUODENOSCOPY (EGD) WITH PROPOFOL N/A 02/03/2014   Dr. Gala Romney:  s/p hemigastrectomy with retained gastric contents. Residual gastric mucosa and efferent limb appeared normal otherwise. Query gastroparesis.   Marland Kitchen ESOPHAGOGASTRODUODENOSCOPY (EGD)  WITH PROPOFOL N/A 04/26/2014   [id number] ring/small HH/mild non-erosive gasrtitis/normal anastomosis  . ESOPHAGOGASTRODUODENOSCOPY (EGD) WITH PROPOFOL N/A 12/23/2014   Dr.Rourk- s/p prior hemigastrctomy, active oozing from anastomotic suture site, hemostasis achieved  . ESOPHAGOGASTRODUODENOSCOPY (EGD) WITH PROPOFOL N/A 05/23/2016   Dr. 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 ESOPHAGOGASTRODUODENOSCOPY (EGD) WITH PROPOFOL N/A 05/11/2017   Procedure: ESOPHAGOGASTRODUODENOSCOPY (EGD) WITH PROPOFOL;  Surgeon: Daneil Dolin, MD;  [facility];  Service: Endoscopy;  Laterality: N/A;  . HEMORRHOID SURGERY N/A 06/18/2017   Procedure: THREE COLUMN EXTENSIVE HEMORRHOIDECTOMY;  Surgeon: Virl Cagey, MD;  Location: [facility];  Service: General;  Laterality: N/A;  . INCISION AND DRAINAGE ABSCESS N/A 01/06/2017   Procedure: INCISION AND DRAINAGE ABDOMINAL WALL ABSCESS;  Surgeon: Aviva Signs, MD;  Location: [facility];  Service: General;  Laterality: N/A;  . tendon repar Right    wrist  . WOUND EXPLORATION Right 06/24/2013   Procedure: exploration of traumatic wound right wrist;  Surgeon: Tennis Must, MD;  Location: [facility];  Service: Orthopedics;  Laterality: Right;    Family History  Problem Relation Age of Onset  . Brain cancer Son   . Schizophrenia Son   . Cancer Son        brain  . Lung cancer Father   . Cancer Father        mets  . Drug abuse Mother   . Breast cancer Maternal Aunt   . Bipolar disorder Maternal Aunt   . Drug abuse Maternal Aunt   . Colon cancer Maternal Grandmother        late 90s, early 2s  . Drug abuse Sister   . Drug abuse Brother   . Bipolar disorder Paternal Grandfather   . Bipolar disorder Cousin   . Liver disease Neg Hx     Social History   Socioeconomic History  . Marital status: Legally Separated    Spouse name: Not on file  . Number of children:  Not on file  . Years of education: Not on file  . Highest education level: Not on file  Occupational History  . Not on file  Social Needs  . Financial resource strain: Not on file  . Food insecurity    Worry: Not on file    Inability: Not on file  . Transportation needs    Medical: Not on file    Non-medical: Not on file  Tobacco Use  . Smoking status: Current Every Day Smoker    Packs/day: 0.50    Years: 35.00    Pack years: 17.50    Types: Cigarettes  . Smokeless tobacco: Never Used  Substance and Sexual Activity  . Alcohol use: Not Currently    Alcohol/week: 0.0 standard drinks    Comment: 2-3 cans every other day; denied 12/09/18  . Drug use: No    Types: Cocaine    Comment: last used 4 days ago on 11/06/18; denied 12/09/18  . Sexual activity: Yes    Birth control/protection: Surgical  Lifestyle  . Physical activity    Days per week: Not on file    Minutes per session: Not on file  . Stress: Not on file  Relationships  . Social Herbalist on phone: Not on file    Gets together: Not on file    Attends religious service: Not on file    Active member of club or organization: Not on file    Attends meetings of clubs or organizations: Not on file    Relationship status: Not on file  . Intimate partner violence    Fear of current or ex partner: Not on file    Emotionally abused: Not on file    Physically abused: Not on file    Forced sexual activity: Not on file  Other Topics Concern  . Not on file  Social History Narrative  . Not on file      ROS:  General: Negative for anorexia, weight loss, fever, chills, fatigue, weakness. Eyes: Negative for vision changes.  ENT: Negative for hoarseness, difficulty swallowing , nasal congestion. CV: Negative for chest pain, angina, palpitations, dyspnea on exertion, peripheral edema.  Respiratory: Negative for dyspnea at rest, dyspnea on exertion, cough, sputum, wheezing.  GI: See history of present illness. GU:   Negative for dysuria, hematuria, urinary incontinence, urinary frequency, nocturnal urination.  MS: Negative for joint pain, low back pain.  Derm: Negative for rash or itching.  Neuro: Negative for weakness, abnormal  sensation, seizure, frequent headaches, memory loss, confusion.  Psych: Negative for anxiety, depression, suicidal ideation, hallucinations.  Endo: Negative for unusual weight change.  Heme: Negative for bruising or bleeding. Allergy: Negative for rash or hives.    Physical Examination:  BP (!) 129/91   Pulse 73   Temp (!) 95.9 F (35.5 C) (Temporal)   Ht 5\' 3"  (1.6 m)   Wt 134 lb 3.2 oz (60.9 kg)   BMI 23.77 kg/m    General: Well-nourished, well-developed in no acute distress.  Head: Normocephalic, atraumatic.   Eyes: Conjunctiva pink, no icterus. Mouth: Oropharyngeal mucosa moist and pink , no lesions erythema or exudate. Neck: Supple without thyromegaly, masses, or lymphadenopathy.  Lungs: Clear to auscultation bilaterally.  Heart: Regular rate and rhythm, no murmurs rubs or gallops.  Abdomen: Bowel sounds are normal, nondistended, no hepatosplenomegaly or masses, no abdominal bruits or    hernia , no rebound or guarding.  Mild diffuse tenderness.  Active draining from fistula on the right upper abdomen, light brown clear liquid. Rectal: Deferred Extremities: No lower extremity edema. No clubbing or deformities.  Neuro: Alert and oriented x 4 , grossly normal neurologically.  Skin: Warm and dry, no rash or jaundice.   Psych: Alert and cooperative, normal mood and affect.  Labs: Lab Results  Component Value Date   CREATININE 0.66 11/11/2018   BUN 9 11/11/2018   NA 138 11/11/2018   K 3.3 (L) 11/11/2018   CL 103 11/11/2018   CO2 23 11/11/2018   Lab Results  Component Value Date   ALT 23 11/11/2018   AST 31 11/11/2018   ALKPHOS 80 11/11/2018   BILITOT 0.6 11/11/2018   Lab Results  Component Value Date   WBC 3.9 (L) 11/12/2018   HGB 10.7 (L)  11/12/2018   HCT 30.6 (L) 11/12/2018   MCV 84.5 11/12/2018   PLT 255 11/12/2018   Lab Results  Component Value Date   LIPASE 12 11/11/2018     Imaging Studies: Ct Abdomen Pelvis W Contrast  Result Date: 11/11/2018 CLINICAL DATA:  Generalized abdominal pain, nausea, vomiting EXAM: CT ABDOMEN AND PELVIS WITH CONTRAST TECHNIQUE: Multidetector CT imaging of the abdomen and pelvis was performed using the standard protocol following bolus administration of intravenous contrast. CONTRAST:  129mL OMNIPAQUE IOHEXOL 300 MG/ML  SOLN COMPARISON:  06/02/2018 FINDINGS: Lower chest: No acute abnormality. Hepatobiliary: Mildly decreased attenuation of the hepatic parenchyma suggesting fatty infiltration. Lesion. Status post cholecystectomy. No biliary dilatation. Pancreas: Unremarkable. No pancreatic ductal dilatation or surrounding inflammatory changes. Spleen: Normal in size without focal abnormality. Adrenals/Urinary Tract: Stable postsurgical changes from prior right adrenalectomy. Stable 2.1 cm left adrenal adenoma. Kidneys are unremarkable without hydronephrosis. Urinary bladder is incompletely distended, limiting its evaluation. Stomach/Bowel: Mild diffuse colonic wall thickening. No focal pericolonic inflammatory changes. There is also mild wall thickening of the terminal ileum. Appendix not clearly visualized. Postsurgical changes from gastro jejunostomy. No evidence of bowel obstruction. Vascular/Lymphatic: Scattered aortoiliac atherosclerotic calcification. No abdominal or pelvic lymphadenopathy. Reproductive: Status post hysterectomy. No adnexal masses. Other: Stable appearance of extensive soft tissue attenuation within the anterior abdominal wall at and near the umbilicus, compatible with exuberant scarring. No ascites. Musculoskeletal: No acute or significant osseous findings. IMPRESSION: 1. Mild diffuse colonic wall thickening suggestive of a non-specific colitis. Terminal ileum also demonstrates a  thickened appearance, which raises the possibility for inflammatory bowel disease. 2. Chronic postsurgical changes within the abdomen and pelvis, not significantly changed compared to prior study. Electronically Signed   By:  Davina Poke M.D.   On: 11/11/2018 10:21   Dg Chest Portable 1 View  Result Date: 11/11/2018 CLINICAL DATA:  Pt c/o abd, n/v/d x 5 days. Hx sickle cell, Lupus, HTN, GERD, smoker EXAM: PORTABLE CHEST 1 VIEW COMPARISON:  Chest radiographs dated 07/18/2017, 05/14/2017 FINDINGS: Stable cardiomediastinal contours within normal limits for [facility] technique. The lungs are clear. No pneumothorax or pleural effusion. Multiple surgical clips are noted in the right upper quadrant of the abdomen. No acute finding in the visualized skeleton. IMPRESSION: No acute cardiopulmonary process. Electronically Signed   By: Audie Pinto M.D.   On: 11/11/2018 09:22

## 2018-12-09 NOTE — Patient Instructions (Addendum)
1. Colonoscopy as scheduled. See separate instructions.  2. You will need a urine drug screen prior to your procedure.  3. Trial of bentyl once capsule up to four times daily for abdominal cramps and diarrhea (before meals and at bedtime).  4. HOLD bentyl three days before your colonoscopy.

## 2018-12-09 NOTE — Telephone Encounter (Signed)
Patient called back. She is scheduled for procedure 10/8 at 7:30am. Patient aware will mail instructions with pre-op/covid-19 testing appt. She asked I mail this to her at [place], [place] [address]. She is going to be staying with her son for a while there. She also asked for me to send Rx to [facility] in [place]. Rx sent. Orders placed. Patient aware if she does not receive these instructions in the next week to call us and let us know. She voiced understanding.

## 2018-12-09 NOTE — Telephone Encounter (Signed)
Patient provided me with # to contact her at [phone]. Called # [id number]. Patient was originally scheduled with [facility]. Per [facility] this is [facility] patient. Spoke with [facility] and confirmed to schedule with [facility]. He has an opening for 10/8 to schedule if patient calls back.

## 2018-12-10 ENCOUNTER — Encounter: Payer: Self-pay | Admitting: *Deleted

## 2018-12-11 NOTE — Assessment & Plan Note (Addendum)
53 year old female with extensive GI history as outlined above, presenting for hospital follow-up.  Recent hospitalization for acute onset vomiting, diarrhea, abdominal pain.  CT with abnormal terminal ileum, entire colon.  Diarrhea did not improve with antibiotic therapy.  Still having 10+ watery nonbloody stools daily.  Continues to have abdominal pain.  She has chronic abdominal wall fistulas, active draining today on exam.  Would recommend colonoscopy to evaluate her symptoms as well as abnormal CT findings.  Plan for deep sedation given history of polysubstance abuse.  Patient was positive for cocaine.  August admission.  Denies further use.  I have discussed the risks, alternatives, benefits with regards to but not limited to the risk of reaction to medication, bleeding, infection, perforation and the patient is agreeable to proceed. Written consent to be obtained.  Trial of Bentyl 10 mg 3 times daily before meals and at bedtime need to hold couple of days prior to bowel prep.

## 2018-12-16 DIAGNOSIS — S31109A Unspecified open wound of abdominal wall, unspecified quadrant without penetration into peritoneal cavity, initial encounter: Secondary | ICD-10-CM | POA: Diagnosis not present

## 2018-12-16 DIAGNOSIS — L02211 Cutaneous abscess of abdominal wall: Secondary | ICD-10-CM | POA: Diagnosis not present

## 2018-12-16 DIAGNOSIS — L089 Local infection of the skin and subcutaneous tissue, unspecified: Secondary | ICD-10-CM | POA: Diagnosis not present

## 2018-12-16 DIAGNOSIS — L0291 Cutaneous abscess, unspecified: Secondary | ICD-10-CM | POA: Diagnosis not present

## 2018-12-18 NOTE — Patient Instructions (Signed)
   Your procedure is scheduled on: 12/24/2018  Report to [facility] at    6:15 AM.  Call this number if you have problems the morning of surgery: [phone]   Remember:              Follow Directions on the letter you received from Your Physician's office regarding the Bowel Prep  :  Take these medicines the morning of surgery with A SIP OF WATER: Amlodipine, Atenolol and Lexapro   Do not wear jewelry, make-up or nail polish.    Do not bring valuables to the hospital.  Contacts, dentures or bridgework may not be worn into surgery.  .   Patients discharged the day of surgery will not be allowed to drive home.     Colonoscopy, Adult, Care After This sheet gives you information about how to care for yourself after your procedure. Your health care provider may also give you more specific instructions. If you have problems or questions, contact your health care provider. What can I expect after the procedure? After the procedure, it is common to have:  A small amount of blood in your stool for 24 hours after the procedure.  Some gas.  Mild abdominal cramping or bloating.  Follow these instructions at home: General instructions   For the first 24 hours after the procedure: ? Do not drive or use machinery. ? Do not sign important documents. ? Do not drink alcohol. ? Do your regular daily activities at a slower pace than normal. ? Eat soft, easy-to-digest foods. ? Rest often.  Take over-the-counter or prescription medicines only as told by your health care provider.  It is up to you to get the results of your procedure. Ask your health care provider, or the department performing the procedure, when your results will be ready. Relieving cramping and bloating  Try walking around when you have cramps or feel bloated.  Apply heat to your abdomen as told by your health care provider. Use a heat source that your health care provider recommends, such as a moist heat pack or a  heating pad. ? Place a towel between your skin and the heat source. ? Leave the heat on for 20-30 minutes. ? Remove the heat if your skin turns bright red. This is especially important if you are unable to feel pain, heat, or cold. You may have a greater risk of getting burned. Eating and drinking  Drink enough fluid to keep your urine clear or pale yellow.  Resume your normal diet as instructed by your health care provider. Avoid heavy or fried foods that are hard to digest.  Avoid drinking alcohol for as long as instructed by your health care provider. Contact a health care provider if:  You have blood in your stool 2-3 days after the procedure. Get help right away if:  You have more than a small spotting of blood in your stool.  You pass large blood clots in your stool.  Your abdomen is swollen.  You have nausea or vomiting.  You have a fever.  You have increasing abdominal pain that is not relieved with medicine. This information is not intended to replace advice given to you by your health care provider. Make sure you discuss any questions you have with your health care provider. Document Released: 10/17/2003 Document Revised: 11/27/2015 Document Reviewed: 05/16/2015 Elsevier Interactive Patient Education  [employer].

## 2018-12-22 ENCOUNTER — Other Ambulatory Visit: Payer: Self-pay

## 2018-12-22 ENCOUNTER — Encounter (HOSPITAL_COMMUNITY)
Admission: RE | Admit: 2018-12-22 | Discharge: 2018-12-22 | Disposition: A | Discharge status: Home / Self Care | Payer: Medicare Other | Source: Ambulatory Visit | Attending: Internal Medicine | Admitting: Internal Medicine

## 2018-12-22 ENCOUNTER — Other Ambulatory Visit (HOSPITAL_COMMUNITY)
Admission: RE | Admit: 2018-12-22 | Discharge: 2018-12-22 | Disposition: A | Discharge status: Home / Self Care | Payer: Medicare Other | Source: Ambulatory Visit | Attending: Internal Medicine | Admitting: Internal Medicine

## 2018-12-22 DIAGNOSIS — Z20828 Contact with and (suspected) exposure to other viral communicable diseases: Secondary | ICD-10-CM | POA: Insufficient documentation

## 2018-12-22 DIAGNOSIS — Z01812 Encounter for preprocedural laboratory examination: Secondary | ICD-10-CM | POA: Diagnosis not present

## 2018-12-22 LAB — RAPID URINE DRUG SCREEN, HOSP PERFORMED
Amphetamines: NOT DETECTED
Barbiturates: NOT DETECTED
Benzodiazepines: NOT DETECTED
Cocaine: NOT DETECTED
Opiates: NOT DETECTED
Tetrahydrocannabinol: NOT DETECTED

## 2018-12-22 LAB — SARS CORONAVIRUS 2 (TAT 6-24 HRS): SARS Coronavirus 2: NEGATIVE

## 2018-12-24 ENCOUNTER — Encounter (HOSPITAL_COMMUNITY)
Admission: RE | Disposition: A | Discharge status: Home / Self Care | Payer: Self-pay | Source: Home / Self Care | Attending: Internal Medicine

## 2018-12-24 ENCOUNTER — Encounter (HOSPITAL_COMMUNITY): Payer: Self-pay | Admitting: *Deleted

## 2018-12-24 ENCOUNTER — Ambulatory Visit (HOSPITAL_COMMUNITY): Payer: Medicare Other | Admitting: Anesthesiology

## 2018-12-24 ENCOUNTER — Ambulatory Visit (HOSPITAL_COMMUNITY)
Admission: RE | Admit: 2018-12-24 | Discharge: 2018-12-24 | Disposition: A | Discharge status: Home / Self Care | Payer: Medicare Other | Attending: Internal Medicine | Admitting: Internal Medicine

## 2018-12-24 DIAGNOSIS — I69354 Hemiplegia and hemiparesis following cerebral infarction affecting left non-dominant side: Secondary | ICD-10-CM | POA: Diagnosis not present

## 2018-12-24 DIAGNOSIS — K5730 Diverticulosis of large intestine without perforation or abscess without bleeding: Secondary | ICD-10-CM | POA: Diagnosis not present

## 2018-12-24 DIAGNOSIS — K635 Polyp of colon: Secondary | ICD-10-CM | POA: Diagnosis not present

## 2018-12-24 DIAGNOSIS — Z79899 Other long term (current) drug therapy: Secondary | ICD-10-CM | POA: Diagnosis not present

## 2018-12-24 DIAGNOSIS — K621 Rectal polyp: Secondary | ICD-10-CM | POA: Diagnosis not present

## 2018-12-24 DIAGNOSIS — Z903 Acquired absence of stomach [part of]: Secondary | ICD-10-CM | POA: Diagnosis not present

## 2018-12-24 DIAGNOSIS — M329 Systemic lupus erythematosus, unspecified: Secondary | ICD-10-CM | POA: Diagnosis not present

## 2018-12-24 DIAGNOSIS — R933 Abnormal findings on diagnostic imaging of other parts of digestive tract: Secondary | ICD-10-CM | POA: Insufficient documentation

## 2018-12-24 DIAGNOSIS — F17210 Nicotine dependence, cigarettes, uncomplicated: Secondary | ICD-10-CM | POA: Diagnosis not present

## 2018-12-24 DIAGNOSIS — F419 Anxiety disorder, unspecified: Secondary | ICD-10-CM | POA: Diagnosis not present

## 2018-12-24 DIAGNOSIS — I10 Essential (primary) hypertension: Secondary | ICD-10-CM | POA: Insufficient documentation

## 2018-12-24 DIAGNOSIS — D573 Sickle-cell trait: Secondary | ICD-10-CM | POA: Insufficient documentation

## 2018-12-24 DIAGNOSIS — D128 Benign neoplasm of rectum: Secondary | ICD-10-CM | POA: Diagnosis not present

## 2018-12-24 DIAGNOSIS — Z8711 Personal history of peptic ulcer disease: Secondary | ICD-10-CM | POA: Diagnosis not present

## 2018-12-24 DIAGNOSIS — K3184 Gastroparesis: Secondary | ICD-10-CM | POA: Diagnosis not present

## 2018-12-24 HISTORY — PX: BIOPSY: SHX5522

## 2018-12-24 HISTORY — PX: COLONOSCOPY WITH PROPOFOL: SHX5780

## 2018-12-24 HISTORY — PX: POLYPECTOMY: SHX5525

## 2018-12-24 SURGERY — COLONOSCOPY WITH PROPOFOL
Anesthesia: General

## 2018-12-24 MED ORDER — KETAMINE HCL 50 MG/5ML IJ SOSY
PREFILLED_SYRINGE | INTRAMUSCULAR | Status: AC
  Filled 2018-12-24: qty 5

## 2018-12-24 MED ORDER — PROPOFOL 10 MG/ML IV BOLUS
INTRAVENOUS | Status: DC | PRN
  Administered 2018-12-24: 30 mg via INTRAVENOUS
  Administered 2018-12-24 (×2): 20 mg via INTRAVENOUS

## 2018-12-24 MED ORDER — ONDANSETRON HCL 4 MG/2ML IJ SOLN
INTRAMUSCULAR | Status: AC
  Filled 2018-12-24: qty 2

## 2018-12-24 MED ORDER — LACTATED RINGERS IV SOLN
INTRAVENOUS | Status: DC | PRN
  Administered 2018-12-24: 07:00:00 via INTRAVENOUS

## 2018-12-24 MED ORDER — PROPOFOL 10 MG/ML IV BOLUS
INTRAVENOUS | Status: AC
  Filled 2018-12-24: qty 40

## 2018-12-24 MED ORDER — CHLORHEXIDINE GLUCONATE CLOTH 2 % EX PADS: 6.0000 | MEDICATED_PAD | Freq: Once | CUTANEOUS | Status: DC

## 2018-12-24 MED ORDER — KETAMINE HCL 10 MG/ML IJ SOLN
INTRAMUSCULAR | Status: DC | PRN
  Administered 2018-12-24 (×2): 10 mg via INTRAVENOUS

## 2018-12-24 MED ORDER — MIDAZOLAM HCL 2 MG/2ML IJ SOLN
INTRAMUSCULAR | Status: AC
  Filled 2018-12-24: qty 2

## 2018-12-24 MED ORDER — PROPOFOL 500 MG/50ML IV EMUL
INTRAVENOUS | Status: DC | PRN
  Administered 2018-12-24: 150 ug/kg/min via INTRAVENOUS

## 2018-12-24 MED ORDER — LACTATED RINGERS IV SOLN
Freq: Once | INTRAVENOUS | Status: AC
  Administered 2018-12-24: 07:00:00 via INTRAVENOUS

## 2018-12-24 MED ORDER — ONDANSETRON HCL 4 MG/2ML IJ SOLN: 4.0000 mg | Freq: Once | INTRAMUSCULAR | Status: DC | PRN

## 2018-12-24 NOTE — Anesthesia Postprocedure Evaluation (Signed)
Anesthesia Post Note  Patient: Angel Price  Procedure(s) Performed: COLONOSCOPY WITH PROPOFOL (N/A ) BIOPSY POLYPECTOMY  Patient location during evaluation: PACU Anesthesia Type: General Level of consciousness: awake and alert and oriented Pain management: pain level controlled Vital Signs Assessment: post-procedure vital signs reviewed and stable Respiratory status: spontaneous breathing Cardiovascular status: blood pressure returned to baseline and stable Postop Assessment: no apparent nausea or vomiting Anesthetic complications: no     Last Vitals:  Vitals:   12/24/18 0656 12/24/18 0810  BP: (!) 171/117 (!) 138/103  Pulse: 73 99  Resp: 16 18  Temp: 36.7 C (P) 36.5 C  SpO2: 100%     Last Pain:  Vitals:   12/24/18 0736  TempSrc:   PainSc: 4                  Naama Sappington

## 2018-12-24 NOTE — Anesthesia Preprocedure Evaluation (Signed)
Anesthesia Evaluation  Patient identified by MRN, date of birth, ID band Patient awake  General Assessment Comment:Prep at 4 am  Reviewed: Allergy & Precautions, NPO status , Patient's Chart, lab work & pertinent test results  History of Anesthesia Complications Negative for: history of anesthetic complications  Airway Mallampati: II  TM Distance: >3 FB Neck ROM: Full    Dental  (+) Missing, Poor Dentition, Caps,    Pulmonary Current SmokerPatient did not abstain from smoking.,    Pulmonary exam normal breath sounds clear to auscultation       Cardiovascular hypertension, Pt. on medications Normal cardiovascular exam Rhythm:Regular Rate:Normal     Neuro/Psych  Headaches, PSYCHIATRIC DISORDERS Anxiety Depression Bipolar Disorder CVA (right sided weakness), Residual Symptoms    GI/Hepatic hiatal hernia, GERD (Not on meds)  Controlled,(+)     substance abuse (11/02/2018)  alcohol use and cocaine use,   Endo/Other  Hypothyroidism (not on meds)   Renal/GU negative Renal ROS     Musculoskeletal negative musculoskeletal ROS (+)   Abdominal   Peds  Hematology  (+) anemia ,   Anesthesia Other Findings   Reproductive/Obstetrics negative OB ROS                             Anesthesia Physical Anesthesia Plan  ASA: III  Anesthesia Plan: General   Post-op Pain Management:    Induction:   PONV Risk Score and Plan: TIVA  Airway Management Planned: Nasal Cannula, Natural Airway and Simple Face Mask  Additional Equipment:   Intra-op Plan:   Post-operative Plan:   Informed Consent: I have reviewed the patients History and Physical, chart, labs and discussed the procedure including the risks, benefits and alternatives for the proposed anesthesia with the patient or authorized representative who has indicated his/her understanding and acceptance.     Dental advisory given  Plan Discussed  with: CRNA  Anesthesia Plan Comments:         Anesthesia Quick Evaluation

## 2018-12-24 NOTE — Discharge Instructions (Signed)
Colonoscopy Discharge Instructions  Read the instructions outlined below and refer to this sheet in the next few weeks. These discharge instructions provide you with general information on caring for yourself after you leave the hospital. Your doctor may also give you specific instructions. While your treatment has been planned according to the most current medical practices available, unavoidable complications occasionally occur. If you have any problems or questions after discharge, call Dr. Gala Romney at [phone]. ACTIVITY  You may resume your regular activity, but move at a slower pace for the next 24 hours.   Take frequent rest periods for the next 24 hours.   Walking will help get rid of the air and reduce the bloated feeling in your belly (abdomen).   No driving for 24 hours (because of the medicine (anesthesia) used during the test).    Do not sign any important legal documents or operate any machinery for 24 hours (because of the anesthesia used during the test).  NUTRITION  Drink plenty of fluids.   You may resume your normal diet as instructed by your doctor.   Begin with a light meal and progress to your normal diet. Heavy or fried foods are harder to digest and may make you feel sick to your stomach (nauseated).   Avoid alcoholic beverages for 24 hours or as instructed.  MEDICATIONS  You may resume your normal medications unless your doctor tells you otherwise.  WHAT YOU CAN EXPECT TODAY  Some feelings of bloating in the abdomen.   Passage of more gas than usual.   Spotting of blood in your stool or on the toilet paper.  IF YOU HAD POLYPS REMOVED DURING THE COLONOSCOPY:  No aspirin products for 7 days or as instructed.   No alcohol for 7 days or as instructed.   Eat a soft diet for the next 24 hours.  FINDING OUT THE RESULTS OF YOUR TEST Not all test results are available during your visit. If your test results are not back during the visit, make an appointment  with your caregiver to find out the results. Do not assume everything is normal if you have not heard from your caregiver or the medical facility. It is important for you to follow up on all of your test results.  SEEK IMMEDIATE MEDICAL ATTENTION IF:  You have more than a spotting of blood in your stool.   Your belly is swollen (abdominal distention).   You are nauseated or vomiting.   You have a temperature over 101.   You have abdominal pain or discomfort that is severe or gets worse throughout the day.   Colon polyp and diverticulosis information provided  Stop using illicit medications  Further recommendations to follow pending review of pathology report  I called boyfriend, [facility] [facility], at [phone].  Got voicemail.  Did not leave a message.   Diverticulosis  Diverticulosis is a condition that develops when small pouches (diverticula) form in the wall of the large intestine (colon). The colon is where water is absorbed and stool is formed. The pouches form when the inside layer of the colon pushes through weak spots in the outer layers of the colon. You may have a few pouches or many of them. What are the causes? The cause of this condition is not known. What increases the risk? The following factors may make you more likely to develop this condition:  Being older than age 23. Your risk for this condition increases with age. Diverticulosis is rare among people younger  than age 48. By age 27, many people have it.  Eating a low-fiber diet.  Having frequent constipation.  Being overweight.  Not getting enough exercise.  Smoking.  Taking over-the-counter pain medicines, like aspirin and ibuprofen.  Having a family history of diverticulosis. What are the signs or symptoms? In most people, there are no symptoms of this condition. If you do have symptoms, they may include:  Bloating.  Cramps in the abdomen.  Constipation or diarrhea.  Pain in the lower left  side of the abdomen. How is this diagnosed? This condition is most often diagnosed during an exam for other colon problems. Because diverticulosis usually has no symptoms, it often cannot be diagnosed independently. This condition may be diagnosed by:  Using a flexible scope to examine the colon (colonoscopy).  Taking an X-ray of the colon after dye has been put into the colon (barium enema).  Doing a CT scan. How is this treated? You may not need treatment for this condition if you have never developed an infection related to diverticulosis. If you have had an infection before, treatment may include:  Eating a high-fiber diet. This may include eating more fruits, vegetables, and grains.  Taking a fiber supplement.  Taking a live bacteria supplement (probiotic).  Taking medicine to relax your colon.  Taking antibiotic medicines. Follow these instructions at home:  Drink 6-8 glasses of water or more each day to prevent constipation.  Try not to strain when you have a bowel movement.  If you have had an infection before: ? Eat more fiber as directed by your health care provider or your diet and nutrition specialist (dietitian). ? Take a fiber supplement or probiotic, if your health care provider approves.  Take over-the-counter and prescription medicines only as told by your health care provider.  If you were prescribed an antibiotic, take it as told by your health care provider. Do not stop taking the antibiotic even if you start to feel better.  Keep all follow-up visits as told by your health care provider. This is important. Contact a health care provider if:  You have pain in your abdomen.  You have bloating.  You have cramps.  You have not had a bowel movement in 3 days. Get help right away if:  Your pain gets worse.  Your bloating becomes very bad.  You have a fever or chills, and your symptoms suddenly get worse.  You vomit.  You have bowel movements that  are bloody or black.  You have bleeding from your rectum. Summary  Diverticulosis is a condition that develops when small pouches (diverticula) form in the wall of the large intestine (colon).  You may have a few pouches or many of them.  This condition is most often diagnosed during an exam for other colon problems.  If you have had an infection related to diverticulosis, treatment may include increasing the fiber in your diet, taking supplements, or taking medicines. This information is not intended to replace advice given to you by your health care provider. Make sure you discuss any questions you have with your health care provider. Document Released: 11/30/2003 Document Revised: 02/14/2017 Document Reviewed: 01/22/2016 Elsevier Patient Education  [employer].  Colon Polyps  Polyps are tissue growths inside the body. Polyps can grow in many places, including the large intestine (colon). A polyp may be a round bump or a mushroom-shaped growth. You could have one polyp or several. Most colon polyps are noncancerous (benign). However, some colon polyps  can become cancerous over time. Finding and removing the polyps early can help prevent this. What are the causes? The exact cause of colon polyps is not known. What increases the risk? You are more likely to develop this condition if you:  Have a family history of colon cancer or colon polyps.  Are older than 62 or older than 45 if you are African American.  Have inflammatory bowel disease, such as ulcerative colitis or Crohn's disease.  Have certain hereditary conditions, such as: ? Familial adenomatous polyposis. ? Lynch syndrome. ? Turcot syndrome. ? Peutz-Jeghers syndrome.  Are overweight.  Smoke cigarettes.  Do not get enough exercise.  Drink too much alcohol.  Eat a diet that is high in fat and red meat and low in fiber.  Had childhood cancer that was treated with abdominal radiation. What are the signs or  symptoms? Most polyps do not cause symptoms. If you have symptoms, they may include:  Blood coming from your rectum when having a bowel movement.  Blood in your stool. The stool may look dark red or black.  Abdominal pain.  A change in bowel habits, such as constipation or diarrhea. How is this diagnosed? This condition is diagnosed with a colonoscopy. This is a procedure in which a lighted, flexible scope is inserted into the anus and then passed into the colon to examine the area. Polyps are sometimes found when a colonoscopy is done as part of routine cancer screening tests. How is this treated? Treatment for this condition involves removing any polyps that are found. Most polyps can be removed during a colonoscopy. Those polyps will then be tested for cancer. Additional treatment may be needed depending on the results of testing. Follow these instructions at home: Lifestyle  Maintain a healthy weight, or lose weight if recommended by your health care provider.  Exercise every day or as told by your health care provider.  Do not use any products that contain nicotine or tobacco, such as cigarettes and e-cigarettes. If you need help quitting, ask your health care provider.  If you drink alcohol, limit how much you have: ? 0-1 drink a day for women. ? 0-2 drinks a day for men.  Be aware of how much alcohol is in your drink. In the [place] one drink equals one 12 oz bottle of beer (355 mL), one 5 oz glass of wine (148 mL), or one 1 oz shot of hard liquor (44 mL). Eating and drinking   Eat foods that are high in fiber, such as fruits, vegetables, and whole grains.  Eat foods that are high in calcium and vitamin D, such as milk, cheese, yogurt, eggs, liver, fish, and broccoli.  Limit foods that are high in fat, such as fried foods and desserts.  Limit the amount of red meat and processed meat you eat, such as hot dogs, sausage, bacon, and lunch meats. General instructions  Keep  all follow-up visits as told by your health care provider. This is important. ? This includes having regularly scheduled colonoscopies. ? Talk to your health care provider about when you need a colonoscopy. Contact a health care provider if:  You have new or worsening bleeding during a bowel movement.  You have new or increased blood in your stool.  You have a change in bowel habits.  You lose weight for no known reason. Summary  Polyps are tissue growths inside the body. Polyps can grow in many places, including the colon.  Most colon polyps are noncancerous (  benign), but some can become cancerous over time.  This condition is diagnosed with a colonoscopy.  Treatment for this condition involves removing any polyps that are found. Most polyps can be removed during a colonoscopy. This information is not intended to replace advice given to you by your health care provider. Make sure you discuss any questions you have with your health care provider. Document Released: 11/29/2003 Document Revised: 06/19/2017 Document Reviewed: 06/19/2017 Elsevier Patient Education  2020 [employer].

## 2018-12-24 NOTE — Op Note (Signed)
[facility] Patient Name: Angel Price Procedure Date: 12/24/2018 6:53 AM MRN: [id number] Date of Birth: 1965-04-21 Attending MD: Norvel Richards , MD CSN: [id number] Age: 53 Admit Type: Outpatient Procedure:                Colonoscopy Indications:              Abnormal CT of the GI tract Providers:                Norvel Richards, MD, Otis Peak B. Sharon Seller, RN,                            Raphael Gibney, Technician Referring MD:              Medicines:                Propofol per Anesthesia Complications:            No immediate complications. Estimated Blood Loss:     Estimated blood loss was minimal. Procedure:                Pre-Anesthesia Assessment:                           - Prior to the procedure, a History and Physical                            was performed, and patient medications and                            allergies were reviewed. The patient's tolerance of                            previous anesthesia was also reviewed. The risks                            and benefits of the procedure and the sedation                            options and risks were discussed with the patient.                            All questions were answered, and informed consent                            was obtained. Prior Anticoagulants: The patient has                            taken no previous anticoagulant or antiplatelet                            agents. ASA Grade Assessment: II - A patient with                            mild systemic disease. After reviewing the risks  and benefits, the patient was deemed in                            satisfactory condition to undergo the procedure.                           After obtaining informed consent, the colonoscope                            was passed under direct vision. Throughout the                            procedure, the patient's blood pressure, pulse, and                            oxygen  saturations were monitored continuously. The                            [id number] scope was introduced through                            the anus and advanced to the 10 cm into the ileum.                            The colonoscopy was performed without difficulty.                            The patient tolerated the procedure well. The                            quality of the bowel preparation was adequate. Scope In: 7:46:33 AM Scope Out: 8:01:30 AM Scope Withdrawal Time: 0 hours 11 minutes 15 seconds  Total Procedure Duration: 0 hours 14 minutes 57 seconds  Findings:      The perianal and digital rectal examinations were normal.      Scattered small and large-mouthed diverticula were found in the entire       colon.      A 5 mm polyp was found in the rectum. The polyp was pedunculated. The       polyp was removed with a cold snare. Resection and retrieval were       complete. Estimated blood loss was minimal. Slightly hemorrhagic mucosa       in the area of the cecum. In part, this can be scope related trauma.       This area was biopsied.      The exam was otherwise without abnormality on direct and retroflexion       views. Impression:               - Diverticulosis in the entire examined colon.                            Normal terminal ileum                           - One 5 mm polyp in the rectum, removed  with a cold                            snare. Resected and retrieved. Somewhat friable                            hemorrhagic mucosa in the area of the ileocecal                            valve/cecum. Which may be artifact/related to scope                            trauma. Status post biopsy.                           - The examination was otherwise normal on direct                            and retroflexion views. Moderate Sedation:      Moderate (conscious) sedation was personally administered by an       anesthesia professional. The following parameters were  monitored: oxygen       saturation, heart rate, blood pressure, and response to care. Recommendation:           - Patient has a contact number available for                            emergencies. The signs and symptoms of potential                            delayed complications were discussed with the                            patient. Return to normal activities tomorrow.                            Written discharge instructions were provided to the                            patient.                           - Advance diet as tolerated. Further                            recommendations to follow pending review of                            pathology report. Patient admonished to avoid                            illicit substances the future. Procedure Code(s):        --- Professional ---                           [phone], Colonoscopy, flexible; with removal of  tumor(s), polyp(s), or other lesion(s) by snare                            technique Diagnosis Code(s):        --- Professional ---                           K62.1, Rectal polyp                           K57.30, Diverticulosis of large intestine without                            perforation or abscess without bleeding                           R93.3, Abnormal findings on diagnostic imaging of                            other parts of digestive tract CPT copyright 2019 American Medical Association. All rights reserved. The codes documented in this report are preliminary and upon coder review may  be revised to meet current compliance requirements. Cristopher Estimable. Bethany Hirt, MD Norvel Richards, MD 12/24/2018 8:22:09 AM This report has been signed electronically. Number of Addenda: 0

## 2018-12-24 NOTE — Transfer of Care (Signed)
Immediate Anesthesia Transfer of Care Note  Patient: Angel Price  Procedure(s) Performed: COLONOSCOPY WITH PROPOFOL (N/A ) BIOPSY POLYPECTOMY  Patient Location: PACU  Anesthesia Type:General  Level of Consciousness: awake  Airway & Oxygen Therapy: Patient Spontanous Breathing  Post-op Assessment: Report given to RN  Post vital signs: Reviewed and stable  Last Vitals:  Vitals Value Taken Time  BP 138/103 12/24/18 0809  Temp    Pulse 73 12/24/18 0810  Resp 18 12/24/18 0810  SpO2 98 % 12/24/18 0810  Vitals shown include unvalidated device data.  Last Pain:  Vitals:   12/24/18 0736  TempSrc:   PainSc: 4       Patients Stated Pain Goal: 7 ([id number])  Complications: No apparent anesthesia complications

## 2018-12-24 NOTE — Interval H&P Note (Signed)
History and Physical Interval Note:  12/24/2018 7:24 AM  Angel Price  has presented today for surgery, with the diagnosis of abnormal colon and terminal ileum on CT.  The various methods of treatment have been discussed with the patient and family. After consideration of risks, benefits and other options for treatment, the patient has consented to  Procedure(s) with comments: COLONOSCOPY WITH PROPOFOL (N/A) - 7:30am ileoscopy as a surgical intervention.  The patient's history has been reviewed, patient examined, no change in status, stable for surgery.  I have reviewed the patient's chart and labs.  Questions were answered to the patient's satisfaction.     Robert Rourk   Somewhat improved.  Otherwise no change.  Ileocolonoscopy today per plan.  The risks, benefits, limitations, alternatives and imponderables have been reviewed with the patient. Questions have been answered. All parties are agreeable.

## 2018-12-25 LAB — SURGICAL PATHOLOGY

## 2018-12-28 ENCOUNTER — Encounter: Payer: Self-pay | Admitting: Internal Medicine

## 2018-12-28 ENCOUNTER — Ambulatory Visit (HOSPITAL_COMMUNITY): Payer: Medicare Other

## 2018-12-28 ENCOUNTER — Telehealth: Payer: Self-pay

## 2018-12-28 NOTE — Telephone Encounter (Signed)
PATIENT SCHEDULED  °

## 2018-12-28 NOTE — Telephone Encounter (Signed)
Per RMR- Send letter to patient.  Send copy of letter with path to referring provider and PCP.  Needs office visit with extender in 3 months if not already scheduled

## 2018-12-31 ENCOUNTER — Encounter (HOSPITAL_COMMUNITY): Payer: Self-pay | Admitting: Internal Medicine

## 2019-01-02 DIAGNOSIS — K2100 Gastro-esophageal reflux disease with esophagitis, without bleeding: Secondary | ICD-10-CM | POA: Diagnosis not present

## 2019-01-02 DIAGNOSIS — I10 Essential (primary) hypertension: Secondary | ICD-10-CM | POA: Diagnosis not present

## 2019-01-26 ENCOUNTER — Emergency Department (HOSPITAL_COMMUNITY)
Admission: EM | Admit: 2019-01-26 | Discharge: 2019-01-27 | Disposition: A | Discharge status: Home / Self Care | Payer: Medicare Other | Attending: Emergency Medicine | Admitting: Emergency Medicine

## 2019-01-26 ENCOUNTER — Other Ambulatory Visit: Payer: Self-pay

## 2019-01-26 ENCOUNTER — Encounter (HOSPITAL_COMMUNITY): Payer: Self-pay

## 2019-01-26 DIAGNOSIS — Z79899 Other long term (current) drug therapy: Secondary | ICD-10-CM | POA: Insufficient documentation

## 2019-01-26 DIAGNOSIS — F17210 Nicotine dependence, cigarettes, uncomplicated: Secondary | ICD-10-CM | POA: Insufficient documentation

## 2019-01-26 DIAGNOSIS — R197 Diarrhea, unspecified: Secondary | ICD-10-CM | POA: Diagnosis not present

## 2019-01-26 DIAGNOSIS — I10 Essential (primary) hypertension: Secondary | ICD-10-CM | POA: Diagnosis not present

## 2019-01-26 DIAGNOSIS — R1013 Epigastric pain: Secondary | ICD-10-CM | POA: Diagnosis not present

## 2019-01-26 DIAGNOSIS — R112 Nausea with vomiting, unspecified: Secondary | ICD-10-CM | POA: Diagnosis not present

## 2019-01-26 LAB — COMPREHENSIVE METABOLIC PANEL
ALT: 79 U/L — ABNORMAL HIGH (ref 0–44)
AST: 151 U/L — ABNORMAL HIGH (ref 15–41)
Albumin: 3.9 g/dL (ref 3.5–5.0)
Alkaline Phosphatase: 155 U/L — ABNORMAL HIGH (ref 38–126)
Anion gap: 11 (ref 5–15)
BUN: 8 mg/dL (ref 6–20)
CO2: 21 mmol/L — ABNORMAL LOW (ref 22–32)
Calcium: 9.1 mg/dL (ref 8.9–10.3)
Chloride: 110 mmol/L (ref 98–111)
Creatinine, Ser: 0.82 mg/dL (ref 0.44–1.00)
GFR calc Af Amer: >60 mL/min (ref >60)
GFR calc non Af Amer: >60 mL/min (ref >60)
Glucose, Bld: 104 mg/dL — ABNORMAL HIGH (ref 70–99)
Potassium: 4 mmol/L (ref 3.5–5.1)
Sodium: 142 mmol/L (ref 135–145)
Total Bilirubin: 0.5 mg/dL (ref 0.3–1.2)
Total Protein: 7.3 g/dL (ref 6.5–8.1)

## 2019-01-26 LAB — CBC
HCT: 34.1 % — ABNORMAL LOW (ref 36.0–46.0)
Hemoglobin: 11.7 g/dL — ABNORMAL LOW (ref 12.0–15.0)
MCH: 28.7 pg (ref 26.0–34.0)
MCHC: 34.3 g/dL (ref 30.0–36.0)
MCV: 83.6 fL (ref 80.0–100.0)
Platelets: 278 10*3/uL (ref 150–400)
RBC: 4.08 MIL/uL (ref 3.87–5.11)
RDW: 16.8 % — ABNORMAL HIGH (ref 11.5–15.5)
WBC: 3.4 10*3/uL — ABNORMAL LOW (ref 4.0–10.5)
nRBC: 0.9 % — ABNORMAL HIGH (ref 0.0–0.2)

## 2019-01-26 LAB — URINALYSIS, ROUTINE W REFLEX MICROSCOPIC
Bilirubin Urine: NEGATIVE
Glucose, UA: NEGATIVE mg/dL
Hgb urine dipstick: NEGATIVE
Ketones, ur: NEGATIVE mg/dL
Leukocytes,Ua: NEGATIVE
Nitrite: NEGATIVE
Protein, ur: NEGATIVE mg/dL
Specific Gravity, Urine: 1.013 (ref 1.005–1.030)
pH: 6 (ref 5.0–8.0)

## 2019-01-26 LAB — I-STAT BETA HCG BLOOD, ED (MC, WL, AP ONLY): I-stat hCG, quantitative: <5 m[IU]/mL (ref <5)

## 2019-01-26 LAB — LIPASE, BLOOD: Lipase: 24 U/L (ref 11–51)

## 2019-01-26 MED ORDER — SODIUM CHLORIDE 0.9% FLUSH: 3.0000 mL | Freq: Once | INTRAVENOUS | Status: DC

## 2019-01-26 NOTE — ED Triage Notes (Signed)
Pt presents w/abd pain, N/V/D x3 days.

## 2019-01-27 MED ORDER — SUCRALFATE 1 G PO TABS
1.0000 g | ORAL_TABLET | Freq: Once | ORAL | Status: AC
  Administered 2019-01-27: 1 g via ORAL
  Filled 2019-01-27: qty 1

## 2019-01-27 MED ORDER — ONDANSETRON 4 MG PO TBDP
4.0000 mg | ORAL_TABLET | Freq: Once | ORAL | Status: AC
  Administered 2019-01-27: 01:00:00 4 mg via ORAL
  Filled 2019-01-27: qty 1

## 2019-01-27 MED ORDER — PROMETHAZINE HCL 25 MG RE SUPP: 25.0000 mg | Freq: Four times a day (QID) | RECTAL | 0 refills | Status: DC | PRN

## 2019-01-27 MED ORDER — SUCRALFATE 1 G PO TABS: 1.0000 g | ORAL_TABLET | Freq: Four times a day (QID) | ORAL | 0 refills | Status: DC

## 2019-01-27 MED ORDER — FAMOTIDINE 20 MG PO TABS
20.0000 mg | ORAL_TABLET | Freq: Once | ORAL | Status: AC
  Administered 2019-01-27: 20 mg via ORAL
  Filled 2019-01-27: qty 1

## 2019-01-27 MED ORDER — OMEPRAZOLE 20 MG PO CPDR: 20.0000 mg | DELAYED_RELEASE_CAPSULE | Freq: Every day | ORAL | 0 refills | Status: DC

## 2019-01-27 NOTE — Discharge Instructions (Addendum)
Thank you for allowing me to care for you today in the Emergency Department.   Call to schedule a follow up appointment with your gastroenterologist.  I would recommend avoiding all alcohol.  You should avoid taking NSAIDs, such as ibuprofen, since you have had a gastrointestinal bleed in the past.  You can take Tylenol for pain, but you should not take this with alcohol.  You can place 1 Phenergan suppository in the rectum every 6 hours as needed for nausea or vomiting.  Take 1 tablet of omeprazole by mouth daily for the next 2 weeks. Take one tablet of carafate every 6 hours.  Your first dose has been given in the ER.  Return to the emergency department if you develop black or bloody stools or vomiting, high fevers, significantly worsening abdominal pain, redness, warmth, or swelling to the wounds on your abdomen, or other new, concerning symptoms.

## 2019-01-27 NOTE — ED Provider Notes (Signed)
[facility] DEPARTMENT Provider Note   CSN: [id number] Arrival date & time: 01/26/19  1259     History   Chief Complaint Chief Complaint  Patient presents with  . Abdominal Pain    HPI Angel Price is a 53 y.o. 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  The patient endorses constant, severe epigastric abdominal pain that radiates around her bilateral upper abdomen into her back that has been accompanied by nausea, nonbloody, nonbilious vomiting, and greater than 10 episodes of watery diarrhea.  She reports that the episodes began in the afternoon on 10/8. States she has vomited "all day every day" and last episode of vomiting was 2-3 hours ago.   She denies fever, chills, dysuria, hematuria, vaginal pain or discharge, chest pain, shortness of breath, or black or bloody stools.  She states that she has been unable to take any of her home medications for the last 3 days, including her home Phenergan and Bentyl, and that she has been unable to keep down any food or fluids due to the vomiting.  She is followed by Dr. Gala Romney and recently underwent a colonoscopy 10/8.  She also reports that she has an upcoming appointment with general surgery for a chronic wound to her abdomen.  She feels as if the drainage from the wounds has increased over the last few weeks.   She is a current, every day smoker.  She also endorses alcohol use.  She initially states that she was drinking alcohol on 10/8.  States that she typically drinks alcohol every  Sunday while watching sporting events.  She denies any other illicit or recreational drug use.  She reports that she was taking Flagyl approximately 1 month ago, but no other recent prescriptions of antibiotics.  No weight loss.  Reports that she uses NSAIDs when she has pain, likely several times per week.      The history is provided by the patient. No language interpreter was used.    Past Medical History:  Diagnosis Date  . Abscess    soft tissue  . Adrenal mass ([place])   . Alcohol abuse   . Anxiety   . Blood transfusion without reported diagnosis   . Chronic abdominal pain   . Chronic wound infection of abdomen   . Colon polyp    colonoscopy 04/2014  . Depression   . Diverticulosis    colonoscopy 04/2014 moderat pan colonic  . Drug-seeking behavior   . Gastritis    EGD 05/2014  . Gastroparesis Nov 2015  . GERD (gastroesophageal reflux disease)   . Hemorrhoid    internal large  . Hiatal hernia   . History of Billroth II operation   . Hypertension   . Lung nodule    CT 02/2014 needs repeat 1 month  . Lung nodule < 6cm on CT 04/25/2014  . Lupus ([place])   . Malingering   . Nausea and vomiting    chronic, recurrent  . Pancreatitis   . Schatzki's ring    patent per EGD 04/2014  . Sickle cell trait ([place])   . Suicide attempt ([place])   . Thyroid disease 2000   overactive, radiation    Patient Active  Problem List   Diagnosis Date Noted  . Abnormal CT scan, colon 12/09/2018  . Abnormal computed tomography of cecum and terminal ileum 12/09/2018  . Cocaine abuse ([place])   . Colitis 11/11/2018  . Hemorrhoidal skin tag   . SBO (small bowel obstruction) ([place])   . Non-intractable vomiting with nausea   . Folate deficiency 05/11/2017  . Abdominal wall fistula   . Cellulitis of abdominal wall 01/02/2017  . Acute left hemiparesis ([place]) 12/05/2016  . Malingering 12/05/2016  . Weakness of left lower extremity 12/02/2016  . CVA (cerebral vascular accident) ([place]) 11/23/2016  . Bright  red rectal bleeding 08/22/2016  . Hypotension due to blood loss   . Acute GI bleeding 05/24/2016  . Dieulafoy lesion of duodenum   . History of Billroth II operation   . Gastrointestinal hemorrhage 05/22/2016  . Absolute anemia   . Acute pancreatitis 10/01/2015  . Abnormal CT scan of lung 10/01/2015  . Alcohol intoxication ([place])   . Left-sided weakness   . Psychosomatic factor in physical condition   . Upper GI bleed   . Diverticulosis of colon with hemorrhage   . Chronic wound infection of abdomen 12/22/2014  . Thyroid disease 12/22/2014  . HTN (hypertension) 12/22/2014  . Ataxia 11/01/2014  . Hemorrhoids 04/26/2014  . Lung nodule 04/26/2014  . Diverticulosis   . Gastritis   . Hiatal hernia   . Schatzki's ring   . Acute blood loss anemia 04/25/2014  . Sinus tachycardia 04/25/2014  . Hypokalemia 04/25/2014  . Hematemesis with nausea 04/25/2014  . Lung nodule < 6cm on CT 04/25/2014  . Intractable nausea and vomiting 04/24/2014  . Gastroparesis   . Abdominal pain 04/01/2014  . Chronic abdominal pain 01/21/2014  . Gastroenteritis 12/10/2013  . Chronic abdominal wound infection 08/16/2013  . MDD (major depressive disorder), recurrent episode, severe ([place]) 06/27/2013  . Wrist laceration 06/24/2013  . Diarrhea 05/07/2013  . Rectal bleeding 05/07/2013  . Abnormal LFTs 05/07/2013  . Adrenal mass, left ([place]) 05/07/2013  . Abdominal wall abscess at site of surgical wound 04/19/2013  . Abdominal wall abscess 04/18/2013  . Frequent headaches 03/30/2013  . Sleep difficulties 03/30/2013  . Essential hypertension, benign 03/30/2013  . History of cocaine abuse ([place]) 03/16/2013  . History of schizoaffective disorder 03/16/2013  . Bipolar disorder ([place]) 03/16/2013  . Personality disorder ([place]) 03/16/2013  . Tobacco abuse 03/16/2013  . Alcohol abuse 03/16/2013  . Palpitations 03/16/2013  . Poor vision 03/16/2013  . History of gastric bypass 03/16/2013  . Status post hysterectomy  with oophorectomy 03/16/2013  . Hypothyroid 03/16/2013  . Lupus ([place]) 03/16/2013    Past Surgical History:  Procedure Laterality Date  . ABDOMINAL HYSTERECTOMY  2013   [place]  . ABDOMINAL SURGERY    . ADRENALECTOMY Right   . AGILE CAPSULE N/A 01/05/2015   Procedure: AGILE CAPSULE;  Surgeon: Daneil Dolin, MD;  Location: [facility];  Service: Endoscopy;  Laterality: N/A;  0700  . Billroth II procedure      [place], first 2000, 2005/2006.  Marland Kitchen BIOPSY  05/20/2013   Procedure: BIOPSIES OF ASCENDING AND SIGMOID COLON;  Surgeon: Daneil Dolin, MD;  Location: [facility];  Service: Endoscopy;;  . BIOPSY  04/26/2014   Procedure: BIOPSIES;  Surgeon: Danie Binder, MD;  Location: [facility];  Service: Endoscopy;;  . BIOPSY  12/24/2018   Procedure: BIOPSY;  Surgeon: Daneil Dolin, MD;  [facility];  Service: Endoscopy;;  colon  . CHOLECYSTECTOMY    .  COLONOSCOPY     in [place]  . COLONOSCOPY WITH PROPOFOL N/A 05/20/2013   Dr.Rourk- inadequate prep, normal appearing rectum, grossly normal colon aside from pancolonic diverticula, normal terminal ileum bx= unremarkable colonic mucosa. Due for early interval 2016.   Marland Kitchen COLONOSCOPY WITH PROPOFOL N/A 04/26/2014   [id number] ileum/one colon polyp removed/moderate pan-colonic diverticulosis/large internal hemorrhoids  . COLONOSCOPY WITH PROPOFOL N/A 12/23/2014   Dr.Rourk- minimal internal hemorrhoids, pancolonic diverticulosis  . COLONOSCOPY WITH PROPOFOL N/A 08/23/2016   Procedure: COLONOSCOPY WITH PROPOFOL;  Surgeon: Danie Binder, MD;  [facility];  Service: Endoscopy;  Laterality: N/A;  . COLONOSCOPY WITH PROPOFOL N/A 12/24/2018   Procedure: COLONOSCOPY WITH PROPOFOL;  Surgeon: Daneil Dolin, MD;  [facility];  Service: Endoscopy;  Laterality: N/A;  7:30am ileoscopy  . DEBRIDEMENT OF ABDOMINAL WALL ABSCESS N/A 02/08/2013   Procedure: DEBRIDEMENT OF ABDOMINAL WALL ABSCESS;  Surgeon: Jamesetta So, MD;  Location: [facility];  Service: General;  Laterality: N/A;  . ESOPHAGOGASTRODUODENOSCOPY (EGD) WITH PROPOFOL N/A 05/20/2013   Dr.Rourk- s/p prior gastric surgery with normal esophagus, residual gastric mucosa and patent efferent limb  . ESOPHAGOGASTRODUODENOSCOPY (EGD) WITH PROPOFOL N/A 02/03/2014   Dr. Gala Romney:  s/p hemigastrectomy with retained gastric contents. Residual gastric mucosa and efferent limb appeared normal otherwise. Query gastroparesis.   Marland Kitchen ESOPHAGOGASTRODUODENOSCOPY (EGD) WITH PROPOFOL N/A 04/26/2014   [id number] ring/small HH/mild non-erosive gasrtitis/normal anastomosis  . ESOPHAGOGASTRODUODENOSCOPY (EGD) WITH PROPOFOL N/A 12/23/2014   Dr.Rourk- s/p prior hemigastrctomy, active oozing from anastomotic suture site, hemostasis achieved  . ESOPHAGOGASTRODUODENOSCOPY (EGD) WITH PROPOFOL N/A 05/23/2016   Dr. 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 ESOPHAGOGASTRODUODENOSCOPY (EGD) WITH PROPOFOL N/A 05/11/2017   Procedure: ESOPHAGOGASTRODUODENOSCOPY (EGD) WITH PROPOFOL;  Surgeon: Daneil Dolin, MD;  [facility];  Service: Endoscopy;  Laterality: N/A;  . HEMORRHOID SURGERY N/A 06/18/2017   Procedure: THREE COLUMN EXTENSIVE HEMORRHOIDECTOMY;  Surgeon: Virl Cagey, MD;  Location: [facility];  Service: General;  Laterality: N/A;  . INCISION AND DRAINAGE ABSCESS N/A 01/06/2017   Procedure: INCISION AND DRAINAGE ABDOMINAL WALL ABSCESS;  Surgeon: Aviva Signs, MD;  Location: [facility];  Service: General;  Laterality: N/A;  . POLYPECTOMY  12/24/2018   Procedure: POLYPECTOMY;  Surgeon: Daneil Dolin, MD;  [facility];  Service: Endoscopy;;  colon  . tendon repar Right    wrist  . WOUND EXPLORATION Right 06/24/2013   Procedure: exploration of traumatic wound right wrist;  Surgeon: Tennis Must, MD;  Location: [facility];  Service: Orthopedics;  Laterality: Right;     OB History    Gravida  4    Para  4   Term  4   Preterm      AB      Living  3     SAB      TAB      Ectopic      Multiple      Live Births               Home Medications    Prior to Admission medications   Medication Sig Start Date End Date Taking? Authorizing Provider  amLODipine (NORVASC) 10 MG tablet Take 0.5 tablets (5 mg total) by mouth daily. 01/03/17   Doreatha Lew, MD  atenolol (TENORMIN) 25 MG tablet Take 1 tablet (25 mg total) by mouth daily. 11/12/18   Emokpae,  Courage, MD  dicyclomine (BENTYL) 10 MG capsule Take 1 capsule (10 mg total) by mouth 4 (four) times daily -  before meals and at bedtime. For diarrhea and abdominal cramps 12/09/18   Mahala Menghini, PA-C  escitalopram (LEXAPRO) 20 MG tablet Take 20 mg daily by mouth.    [provider]  folic acid (FOLVITE) 1 MG tablet Take 1 tablet (1 mg total) by mouth daily. 11/13/18   Roxan Hockey, MD  omeprazole (PRILOSEC) 20 MG capsule Take 1 capsule (20 mg total) by mouth daily for 14 days. 01/27/19 02/10/19  Saryiah Bencosme A, PA-C  ondansetron (ZOFRAN ODT) 4 MG disintegrating tablet Take 1 tablet (4 mg total) by mouth every 8 (eight) hours as needed for nausea. 11/12/18   Roxan Hockey, MD  promethazine (PHENERGAN) 25 MG suppository Place 1 suppository (25 mg total) rectally every 6 (six) hours as needed for nausea or vomiting. 01/27/19   Vala Raffo A, PA-C  sucralfate (CARAFATE) 1 g tablet Take 1 tablet (1 g total) by mouth 4 (four) times daily for 14 days. 01/27/19 02/10/19  Thien Berka A, PA-C  thiamine 100 MG tablet Take 1 tablet (100 mg total) by mouth daily. 11/13/18   Roxan Hockey, MD    Family History Family History  Problem Relation Age of Onset  . Brain cancer Son   . Schizophrenia Son   . Cancer Son        brain  . Lung cancer Father   . Cancer Father        mets  . Drug abuse Mother   . Breast cancer Maternal Aunt   . Bipolar disorder Maternal Aunt   . Drug abuse Maternal Aunt   . Colon  cancer Maternal Grandmother        late 75s, early 40s  . Drug abuse Sister   . Drug abuse Brother   . Bipolar disorder Paternal Grandfather   . Bipolar disorder Cousin   . Liver disease Neg Hx     Social History Social History   Tobacco Use  . Smoking status: Current Every Day Smoker    Packs/day: 0.50    Years: 35.00    Pack years: 17.50    Types: Cigarettes  . Smokeless tobacco: Never Used  Substance Use Topics  . Alcohol use: Not Currently    Alcohol/week: 0.0 standard drinks    Comment: 2-3 cans every other day; denied 12/09/18  . Drug use: No    Types: Cocaine    Comment: last used 4 days ago on 11/06/18; denied 12/09/18     Allergies   Codeine, Morphine and related, and Reglan [metoclopramide]   Review of Systems Review of Systems  Constitutional: Negative for activity change, chills and fever.  HENT: Negative for congestion.   Respiratory: Negative for shortness of breath.   Cardiovascular: Negative for chest pain and palpitations.  Gastrointestinal: Positive for abdominal pain, diarrhea, nausea and vomiting. Negative for anal bleeding, blood in stool and constipation.  Genitourinary: Negative for dysuria, enuresis, flank pain, hematuria, urgency, vaginal discharge and vaginal pain.  Musculoskeletal: Negative for back pain, gait problem, joint swelling, neck pain and neck stiffness.  Skin: Negative for rash.  Allergic/Immunologic: Negative for immunocompromised state.  Neurological: Negative for syncope, weakness, numbness and headaches.  Psychiatric/Behavioral: Negative for confusion.     Physical Exam Updated Vital Signs BP (!) 148/102   Pulse 75   Temp 98.4 F (36.9 C) (Oral)   Resp (!) 22   SpO2 100%  Physical Exam Vitals signs and nursing note reviewed.  Constitutional:      General: She is not in acute distress.    Appearance: She is not ill-appearing, toxic-appearing or diaphoretic.     Comments: Well-appearing.  No acute distress.  HENT:      Head: Normocephalic.     Mouth/Throat:     Comments: Mucous membranes are moist. Eyes:     Conjunctiva/sclera: Conjunctivae normal.  Neck:     Musculoskeletal: Neck supple.  Cardiovascular:     Rate and Rhythm: Normal rate and regular rhythm.     Pulses: Normal pulses.     Heart sounds: Normal heart sounds. No murmur. No friction rub. No gallop.   Pulmonary:     Effort: Pulmonary effort is normal. No respiratory distress.     Breath sounds: No stridor. No wheezing, rhonchi or rales.  Chest:     Chest wall: No tenderness.  Abdominal:     General: There is no distension.     Palpations: Abdomen is soft.     Tenderness: There is abdominal tenderness. There is no right CVA tenderness, left CVA tenderness or guarding.     Comments: Tender to palpation in the epigastric region without rebound or guarding.  Abdomen is soft and nondistended.  No tenderness in the lower abdomen.  There are multiple chronic wounds noted to the abdominal wall.  Minimal seropurulent drainage is able to be expressed.  No surrounding redness or warmth.  Musculoskeletal:     Right lower leg: No edema.     Left lower leg: No edema.  Skin:    General: Skin is warm.     Capillary Refill: Capillary refill takes less than 2 seconds.     Findings: No rash.  Neurological:     Mental Status: She is alert.  Psychiatric:        Behavior: Behavior normal.      ED Treatments / Results  Labs (all labs ordered are listed, but only abnormal results are displayed) Labs Reviewed  COMPREHENSIVE METABOLIC PANEL - Abnormal; Notable for the following components:      Result Value   CO2 21 (*)    Glucose, Bld 104 (*)    AST 151 (*)    ALT 79 (*)    Alkaline Phosphatase 155 (*)    All other components within normal limits  CBC - Abnormal; Notable for the following components:   WBC 3.4 (*)    Hemoglobin 11.7 (*)    HCT 34.1 (*)    RDW 16.8 (*)    nRBC 0.9 (*)    All other components within normal limits   LIPASE, BLOOD  URINALYSIS, ROUTINE W REFLEX MICROSCOPIC  I-STAT BETA HCG BLOOD, ED ([facility], [facility], [facility] ONLY)    EKG EKG Interpretation  Date/Time:  Tuesday January 26 2019 13:31:39 EST Ventricular Rate:  93 PR Interval:  142 QRS Duration: 68 QT Interval:  374 QTC Calculation: 465 R Axis:   58 Text Interpretation: Normal sinus rhythm Normal ECG When compared with ECG of 07/02/2017, No significant change was found Confirmed by Delora Fuel ([id number]) on 01/27/2019 12:06:36 AM   Radiology No results found.  Procedures Procedures (including critical care time)  Medications Ordered in ED Medications  sodium chloride flush (NS) 0.9 % injection 3 mL (3 mLs Intravenous Not Given 01/27/19 0008)  famotidine (PEPCID) tablet 20 mg (20 mg Oral Given 01/27/19 0101)  ondansetron (ZOFRAN-ODT) disintegrating tablet 4 mg (4 mg Oral Given 01/27/19 0102)  sucralfate (CARAFATE) tablet 1 g (1 g Oral Given 01/27/19 0101)     Initial Impression / Assessment and Plan / ED Course  I have reviewed the triage vital signs and the nursing notes.  Pertinent labs & imaging results that were available during my care of the patient were reviewed by me and considered in my medical decision making (see chart for details).        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  No constitutional symptoms or GU complaints.  The patient has an extensive, complicated GI history as mentioned above.  She is currently established with gastroenterology and general surgery.  In the ER,  she is afebrile and does not have any tachycardia. BP is 130s/90s despite stating that she has not taken her home antihypertensive medication in 3 days.  Her mucous membranes are moist and she has good capillary refill.  She does not appear dehydrated.  This is consistent with her presentation on her labs.  On exam, her tenderness present predominantly in the epigastric region.  No peritoneal signs.  She does have several chronic wounds to the abdomen, but these do not appear acutely infected.  The patient's medical record has been reviewed at length.  The patient was seen by gastroenterology on September 23 and endorsing similar symptoms to her presentation today, including at least 10+ watery stools daily.  Her only recent antibiotic has been Flagyl.  I have a low suspicion for C. difficile given the length of time that her symptoms seem to have persisted.  During her last visit, she did have an abnormal CT that was showing diffuse colonic wall thickening, but she underwent colonoscopy, which was unremarkable aside from a single polyp which was removed.  I suspect that a component of the patient's symptoms are secondary to alcohol use.  Her transaminase and alkaline phosphatase are acutely elevated today. She has had acutely elevated transaminases in the past, which were improved until today.AST:ALT is ~2:1, suggestive of alcohol etiology.   the patient does endorse that she drinks beer weekly and symptoms began per the patient around the time that she was drinking alcohol.  Doubt pancreatitis with a normal lipase, recurrent intraabdominal abscess, diverticulitis, or pyelonephritis.  Of note, the patient has been in the ER for greater than 11 hours.  There are no documented episodes of vomiting and she has not received any antiemetics prior to my evaluation.  We will treat the patient's symptoms Zofran, Carafate, and Pepcid and plan to fluid challenge the patient.  I discussed the patient with Dr. Roxanne Mins,  attending physician.  At this time, I do not feel that further imaging of the abdomen is indicated as her physical exam and labs are overall reassuring.  On reevaluation the patient, she has successfully tolerated fluids by mouth without difficulty.  She reports that her pain is significantly improved.  We will discharge the patient home with Rx for the same and advised GI follow-up.  She is hemodynamically stable and in no acute distress.  ER return precautions given.  Safe for discharge to home with outpatient follow-up as indicated.  Final Clinical Impressions(s) / ED Diagnoses   Final diagnoses:  Epigastric pain  Nausea vomiting and  diarrhea    ED Discharge Orders         Ordered    promethazine (PHENERGAN) 25 MG suppository  Every 6 hours PRN     01/27/19 0102    sucralfate (CARAFATE) 1 g tablet  4 times daily     01/27/19 0102    omeprazole (PRILOSEC) 20 MG capsule  Daily     01/27/19 0102           Joline Maxcy A, PA-C [id number]    Delora Fuel, MD [id number] [phone]

## 2019-01-27 NOTE — ED Notes (Signed)
PO challenge started

## 2019-02-02 DIAGNOSIS — I10 Essential (primary) hypertension: Secondary | ICD-10-CM | POA: Diagnosis not present

## 2019-02-02 DIAGNOSIS — K2100 Gastro-esophageal reflux disease with esophagitis, without bleeding: Secondary | ICD-10-CM | POA: Diagnosis not present

## 2019-02-05 IMAGING — CT CT ABD-PELV W/O CM
2 of 4 series · 16 of 46 positions shown, 18 images · non-contrast
Comparison: 06/09/2017 CT

CLINICAL DATA: Abdominal pain with nausea, vomiting and diarrhea x3
days. Knot on the right side of the abdomen.

EXAM:
CT ABDOMEN AND PELVIS WITHOUT CONTRAST
TECHNIQUE: Multidetector CT imaging of the abdomen and pelvis was performed
following the standard protocol without IV contrast.

[Series 2: axial st · axial · 0.68mm/px · z∈[-560,-150]mm · 13 of 90 slices shown, 15 images]
[im 4/90  soft-tissue]
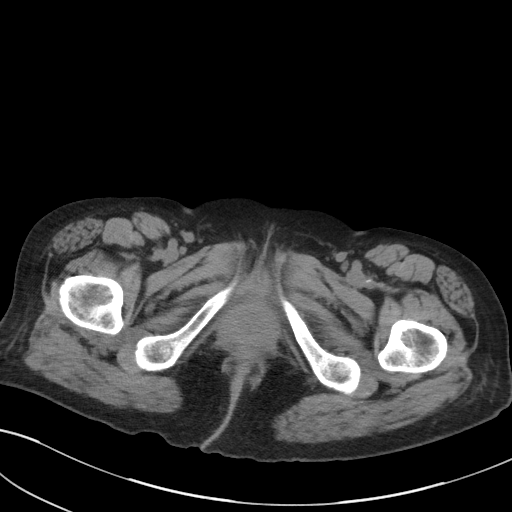
[im 4/90  bone]
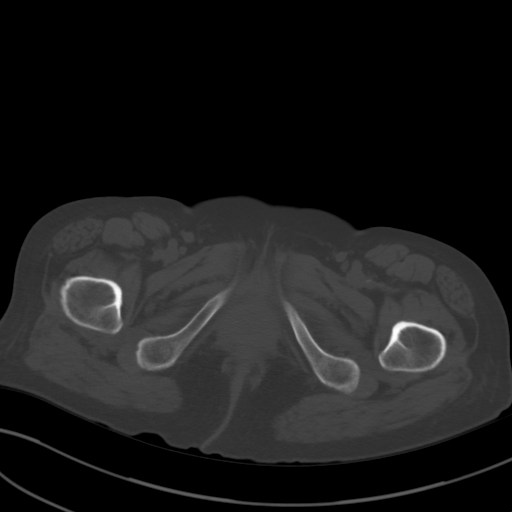
[im 11/90  soft-tissue]
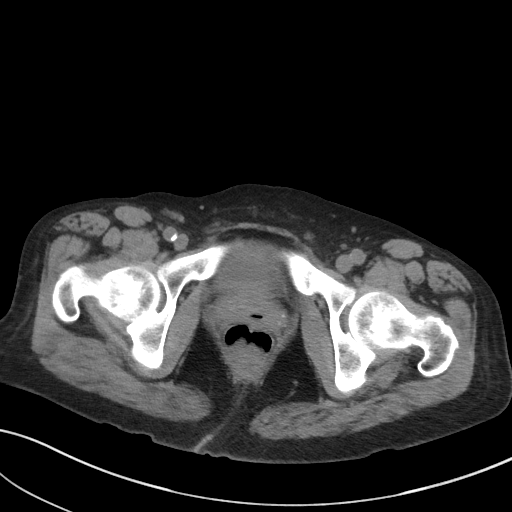
[im 18/90  soft-tissue]
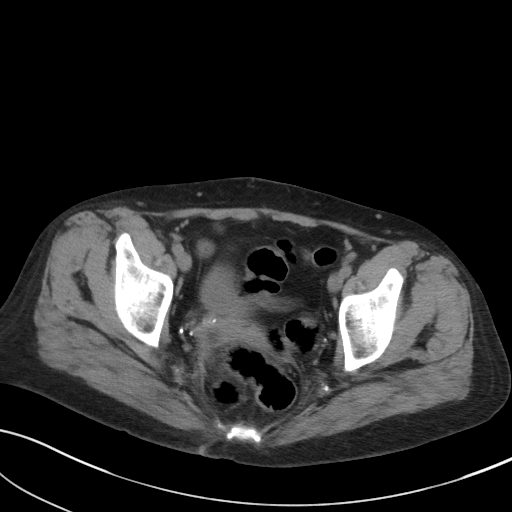
[im 25/90  soft-tissue]
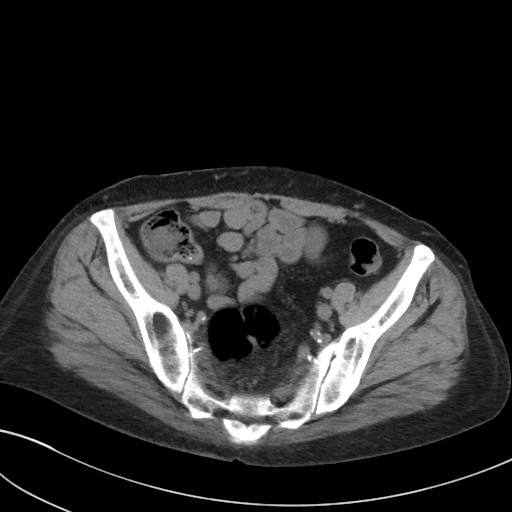
[im 33/90  soft-tissue]
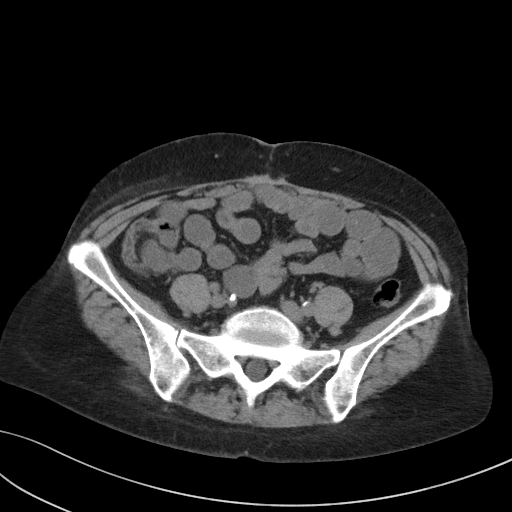
[im 40/90  soft-tissue]
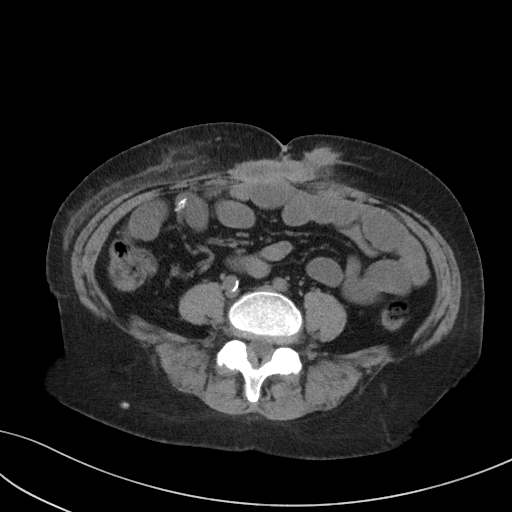
[im 47/90  soft-tissue]
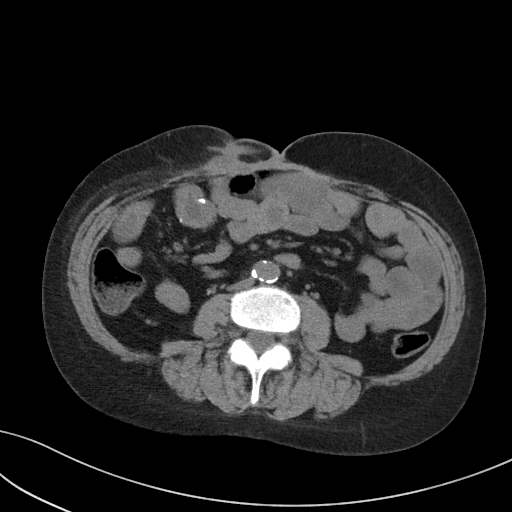
[im 50/90  soft-tissue]
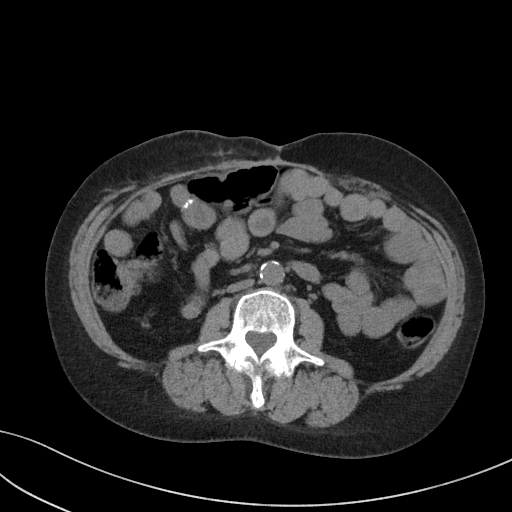
[im 57/90  soft-tissue]
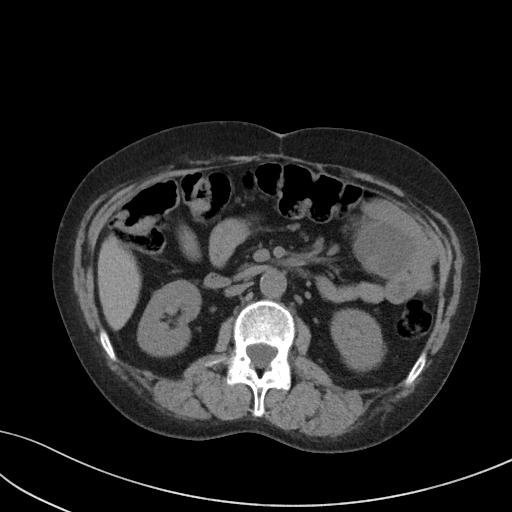
[im 57/90  bone]
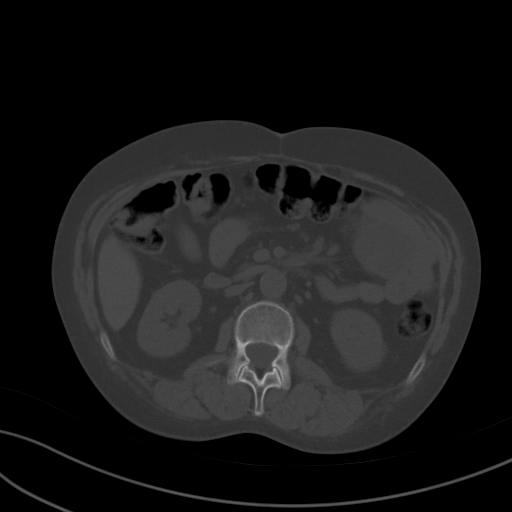
[im 65/90  soft-tissue]
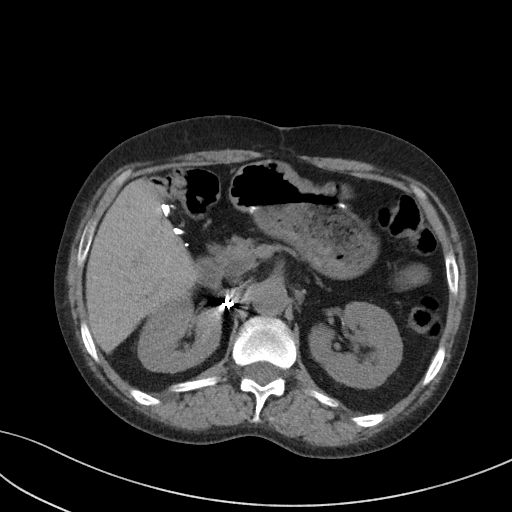
[im 72/90  soft-tissue]
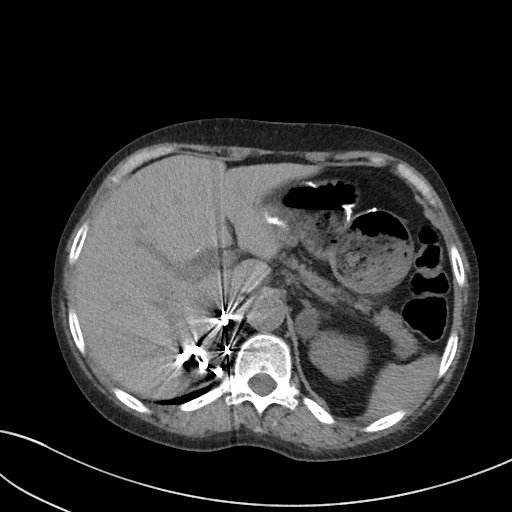
[im 79/90  soft-tissue]
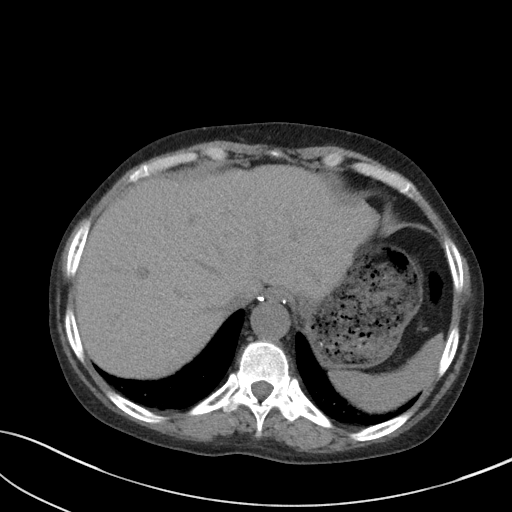
[im 86/90  soft-tissue]
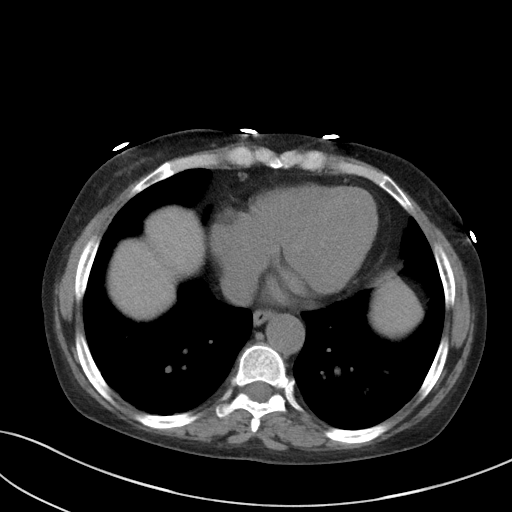

[Series 5: coronal st · coronal · 0.75mm/px · 3 of 72 slices shown]
[im 24/72  soft-tissue]
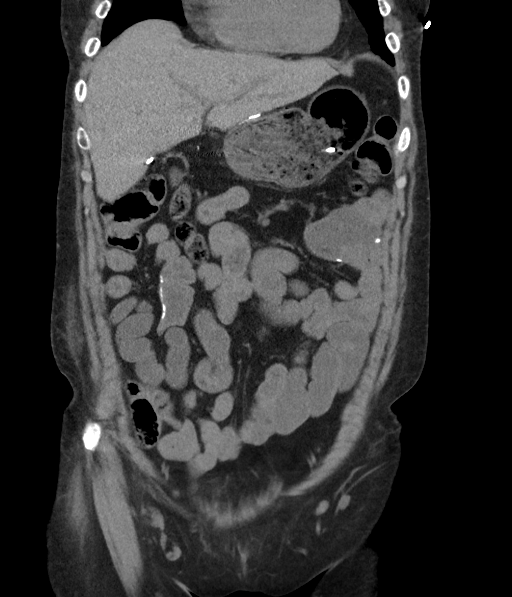
[im 32/72  soft-tissue]
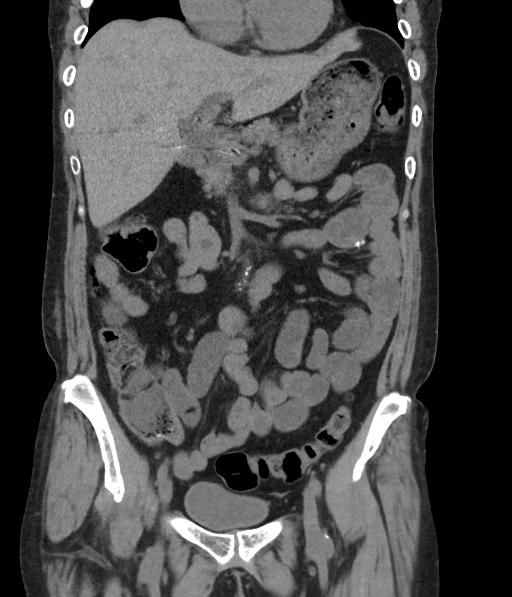
[im 40/72  soft-tissue]
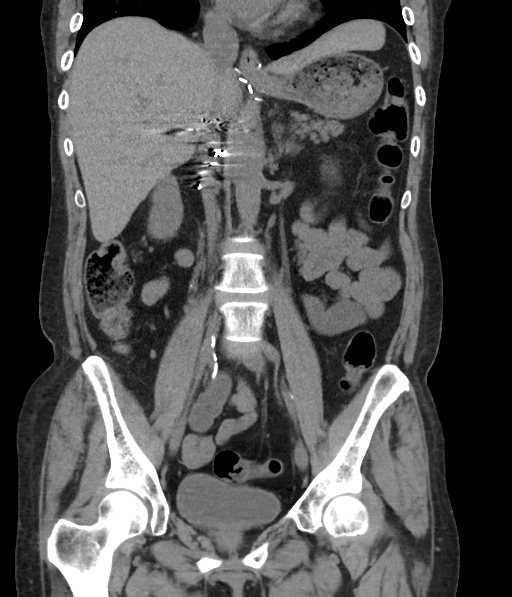

[16 of 46 positions shown; findings below may reference images not displayed]

FINDINGS: LOWER CHEST: Dependent atelectasis. Subpleural blebs posteriorly at
the right lung base. Mild cardiomegaly without pericardial effusion.

HEPATOBILIARY: Cholecystectomy. The unenhanced liver demonstrates no
space-occupying mass. Chronic stable intra and extrahepatic ductal
dilatation likely representing reservoir effect status post
cholecystectomy. No choledocholithiasis.

PANCREAS: Atrophic pancreas without mass, ductal dilatation or
inflammation.

SPLEEN: Normal size spleen without mass.

ADRENALS/URINARY TRACT: Kidneys are orthotopic. No nephrolithiasis,
hydronephrosis or solid renal masses. The unopacified ureters are
normal in course and caliber. Urinary bladder is partially distended
and unremarkable. Hypodense 2 cm nodule of the posterior limb of the
left adrenal gland compatible with a benign adenoma. Numerous
surgical clips in the region of the right adrenal gland as before
compatible with right adrenalectomy.

STOMACH/BOWEL: Status post Billroth 2 gastrojejunostomy. No bowel
obstruction or inflammation.

VASCULAR/LYMPHATIC: Aortoiliac atherosclerosis. No aneurysm. No
lymphadenopathy by CT size criteria.

REPRODUCTIVE: Hysterectomy.  No adnexal mass.

OTHER: No intraperitoneal free fluid or free air. Periumbilical
subcutaneous thick rimmed ventral fluid collection is again noted
with scarring in the right lower quadrant that may account for the
patient's palpable area of abnormality. The volume of fluid within
the collection has remained roughly the same since prior measuring
7.1 x 1.8 x 4.1 cm (volume = 27 cm^3), series [DATE] versus 6.1 x 1
x 2.4 cm (volume = 27 cm^3). The fistulous connections suspected
are less apparent current exam though not excluded.

MUSCULOSKELETAL: Nonacute.
IMPRESSION: 1. Redemonstration of periumbilical subcutaneous slightly thick
walled collection averaging approximately 27 cubic cm. Previously
suggested fistulous tracts are not well visualized currently but may
still be present given presence of fluid.
2. Right lower quadrant subcutaneous soft tissue density without
fluid likely representing a focus of scar tissue.
3. Status post Billroth 2 procedure without acute bowel obstruction
or inflammation.

## 2019-02-21 ENCOUNTER — Encounter (HOSPITAL_COMMUNITY): Payer: Self-pay

## 2019-02-21 ENCOUNTER — Emergency Department (HOSPITAL_COMMUNITY)
Admission: EM | Admit: 2019-02-21 | Discharge: 2019-02-21 | Disposition: A | Discharge status: Home / Self Care | Payer: Medicare Other | Source: Home / Self Care | Attending: Emergency Medicine | Admitting: Emergency Medicine

## 2019-02-21 ENCOUNTER — Other Ambulatory Visit: Payer: Self-pay

## 2019-02-21 DIAGNOSIS — G894 Chronic pain syndrome: Secondary | ICD-10-CM | POA: Diagnosis not present

## 2019-02-21 DIAGNOSIS — Z86010 Personal history of colonic polyps: Secondary | ICD-10-CM | POA: Diagnosis not present

## 2019-02-21 DIAGNOSIS — R197 Diarrhea, unspecified: Secondary | ICD-10-CM

## 2019-02-21 DIAGNOSIS — I4581 Long QT syndrome: Secondary | ICD-10-CM | POA: Diagnosis not present

## 2019-02-21 DIAGNOSIS — R112 Nausea with vomiting, unspecified: Secondary | ICD-10-CM | POA: Insufficient documentation

## 2019-02-21 DIAGNOSIS — L089 Local infection of the skin and subcutaneous tissue, unspecified: Secondary | ICD-10-CM | POA: Diagnosis not present

## 2019-02-21 DIAGNOSIS — Z888 Allergy status to other drugs, medicaments and biological substances status: Secondary | ICD-10-CM | POA: Diagnosis not present

## 2019-02-21 DIAGNOSIS — K279 Peptic ulcer, site unspecified, unspecified as acute or chronic, without hemorrhage or perforation: Secondary | ICD-10-CM | POA: Diagnosis not present

## 2019-02-21 DIAGNOSIS — S31109D Unspecified open wound of abdominal wall, unspecified quadrant without penetration into peritoneal cavity, subsequent encounter: Secondary | ICD-10-CM | POA: Diagnosis not present

## 2019-02-21 DIAGNOSIS — S31109A Unspecified open wound of abdominal wall, unspecified quadrant without penetration into peritoneal cavity, initial encounter: Secondary | ICD-10-CM | POA: Diagnosis not present

## 2019-02-21 DIAGNOSIS — Z885 Allergy status to narcotic agent status: Secondary | ICD-10-CM | POA: Diagnosis not present

## 2019-02-21 DIAGNOSIS — F17210 Nicotine dependence, cigarettes, uncomplicated: Secondary | ICD-10-CM | POA: Insufficient documentation

## 2019-02-21 DIAGNOSIS — K3184 Gastroparesis: Secondary | ICD-10-CM | POA: Diagnosis not present

## 2019-02-21 DIAGNOSIS — I10 Essential (primary) hypertension: Secondary | ICD-10-CM | POA: Insufficient documentation

## 2019-02-21 DIAGNOSIS — L03311 Cellulitis of abdominal wall: Secondary | ICD-10-CM | POA: Diagnosis not present

## 2019-02-21 DIAGNOSIS — R109 Unspecified abdominal pain: Secondary | ICD-10-CM | POA: Diagnosis not present

## 2019-02-21 DIAGNOSIS — Z79899 Other long term (current) drug therapy: Secondary | ICD-10-CM | POA: Insufficient documentation

## 2019-02-21 DIAGNOSIS — R1110 Vomiting, unspecified: Secondary | ICD-10-CM | POA: Diagnosis not present

## 2019-02-21 DIAGNOSIS — Z9884 Bariatric surgery status: Secondary | ICD-10-CM | POA: Diagnosis not present

## 2019-02-21 DIAGNOSIS — K219 Gastro-esophageal reflux disease without esophagitis: Secondary | ICD-10-CM | POA: Diagnosis not present

## 2019-02-21 DIAGNOSIS — L02211 Cutaneous abscess of abdominal wall: Secondary | ICD-10-CM | POA: Diagnosis not present

## 2019-02-21 DIAGNOSIS — E039 Hypothyroidism, unspecified: Secondary | ICD-10-CM | POA: Insufficient documentation

## 2019-02-21 DIAGNOSIS — R9431 Abnormal electrocardiogram [ECG] [EKG]: Secondary | ICD-10-CM | POA: Diagnosis not present

## 2019-02-21 DIAGNOSIS — G8929 Other chronic pain: Secondary | ICD-10-CM | POA: Diagnosis not present

## 2019-02-21 DIAGNOSIS — R1084 Generalized abdominal pain: Secondary | ICD-10-CM | POA: Insufficient documentation

## 2019-02-21 DIAGNOSIS — K861 Other chronic pancreatitis: Secondary | ICD-10-CM | POA: Diagnosis not present

## 2019-02-21 DIAGNOSIS — Z903 Acquired absence of stomach [part of]: Secondary | ICD-10-CM | POA: Diagnosis not present

## 2019-02-21 LAB — COMPREHENSIVE METABOLIC PANEL
ALT: 58 U/L — ABNORMAL HIGH (ref 0–44)
AST: 56 U/L — ABNORMAL HIGH (ref 15–41)
Albumin: 4.4 g/dL (ref 3.5–5.0)
Alkaline Phosphatase: 111 U/L (ref 38–126)
Anion gap: 10 (ref 5–15)
BUN: 22 mg/dL — ABNORMAL HIGH (ref 6–20)
CO2: 24 mmol/L (ref 22–32)
Calcium: 8.7 mg/dL — ABNORMAL LOW (ref 8.9–10.3)
Chloride: 107 mmol/L (ref 98–111)
Creatinine, Ser: 0.66 mg/dL (ref 0.44–1.00)
GFR calc Af Amer: >60 mL/min (ref >60)
GFR calc non Af Amer: >60 mL/min (ref >60)
Glucose, Bld: 126 mg/dL — ABNORMAL HIGH (ref 70–99)
Potassium: 4.1 mmol/L (ref 3.5–5.1)
Sodium: 141 mmol/L (ref 135–145)
Total Bilirubin: 0.8 mg/dL (ref 0.3–1.2)
Total Protein: 8.3 g/dL — ABNORMAL HIGH (ref 6.5–8.1)

## 2019-02-21 LAB — CBC
HCT: 39.4 % (ref 36.0–46.0)
Hemoglobin: 13.7 g/dL (ref 12.0–15.0)
MCH: 28.5 pg (ref 26.0–34.0)
MCHC: 34.8 g/dL (ref 30.0–36.0)
MCV: 82.1 fL (ref 80.0–100.0)
Platelets: 335 10*3/uL (ref 150–400)
RBC: 4.8 MIL/uL (ref 3.87–5.11)
RDW: 15.3 % (ref 11.5–15.5)
WBC: 3.1 10*3/uL — ABNORMAL LOW (ref 4.0–10.5)
nRBC: 0 % (ref 0.0–0.2)

## 2019-02-21 LAB — URINALYSIS, ROUTINE W REFLEX MICROSCOPIC
Bilirubin Urine: NEGATIVE
Glucose, UA: NEGATIVE mg/dL
Hgb urine dipstick: NEGATIVE
Ketones, ur: NEGATIVE mg/dL
Leukocytes,Ua: NEGATIVE
Nitrite: NEGATIVE
Protein, ur: NEGATIVE mg/dL
Specific Gravity, Urine: 1.016 (ref 1.005–1.030)
pH: 5 (ref 5.0–8.0)

## 2019-02-21 LAB — I-STAT BETA HCG BLOOD, ED (MC, WL, AP ONLY): I-stat hCG, quantitative: <5 m[IU]/mL (ref <5)

## 2019-02-21 LAB — LIPASE, BLOOD: Lipase: 19 U/L (ref 11–51)

## 2019-02-21 MED ORDER — SODIUM CHLORIDE 0.9 % IV BOLUS
500.0000 mL | Freq: Once | INTRAVENOUS | Status: AC
  Administered 2019-02-21: 500 mL via INTRAVENOUS

## 2019-02-21 MED ORDER — SODIUM CHLORIDE 0.9% FLUSH: 3.0000 mL | Freq: Once | INTRAVENOUS | Status: DC

## 2019-02-21 MED ORDER — FAMOTIDINE IN NACL 20-0.9 MG/50ML-% IV SOLN
20.0000 mg | Freq: Once | INTRAVENOUS | Status: AC
  Administered 2019-02-21: 20 mg via INTRAVENOUS
  Filled 2019-02-21: qty 50

## 2019-02-21 MED ORDER — OXYCODONE-ACETAMINOPHEN 5-325 MG PO TABS
1.0000 | ORAL_TABLET | Freq: Once | ORAL | Status: AC
  Administered 2019-02-21: 1 via ORAL
  Filled 2019-02-21: qty 1

## 2019-02-21 MED ORDER — PROMETHAZINE HCL 25 MG/ML IJ SOLN
12.5000 mg | Freq: Once | INTRAMUSCULAR | Status: AC
  Administered 2019-02-21: 12.5 mg via INTRAVENOUS
  Filled 2019-02-21: qty 1

## 2019-02-21 MED ORDER — SUCRALFATE 1 G PO TABS
1.0000 g | ORAL_TABLET | Freq: Once | ORAL | Status: AC
  Administered 2019-02-21: 1 g via ORAL
  Filled 2019-02-21: qty 1

## 2019-02-21 NOTE — Discharge Instructions (Addendum)
Take your prescribed medications as directed for your symptoms. Follow closely with your GI specialist regarding your visit today. Follow up with your surgeon for your chronic wounds. Return to the ER for fever, severely worsening abdominal pain, uncontrollable vomiting.

## 2019-02-21 NOTE — ED Provider Notes (Signed)
Care assumed at shift change from [place], [place], pending re-evaluation. See her note for full HPI and workup. Briefly, pt presenting with N/v/d abd pain, dec appetite. Has extensive GI surgical hx with chronic wound drainage. Also has PUD and chronic alcohol use. Suspected dx of chronic abd pain related to GERD/PUD exacerbated by daily EtOH use, vs viral gastroenteritis. Pt has outpt F/u with GI and surgeon Dr. Arnoldo Morale for surgical wounds.  Labs unchanged from baseline. Likely chronic issue, no imaging indicated today. PO perc, IVF, pepcid, phenergan.  PO challenge w anticipated d/c.   Physical Exam  BP (!) 133/96 (BP Location: Left Arm)   Pulse 69   Temp 97.7 F (36.5 C) (Oral)   Resp 15   SpO2 100%   Physical Exam Vitals signs and nursing note reviewed.  Constitutional:      General: She is not in acute distress.    Appearance: She is well-developed.  HENT:     Head: Normocephalic and atraumatic.  Eyes:     Conjunctiva/sclera: Conjunctivae normal.  Cardiovascular:     Rate and Rhythm: Normal rate.  Pulmonary:     Effort: Pulmonary effort is normal.  Abdominal:     Palpations: Abdomen is soft.  Skin:    General: Skin is warm.  Neurological:     Mental Status: She is alert.  Psychiatric:        Behavior: Behavior normal.     ED Course/Procedures   Clinical Course as of Feb 21 1656  Sun Feb 21, 2019  1428 Dr Dudley Major for GI and Dr Arnoldo Morale for abdominal surgery    [CG]  1652 Pt evaluated after interventions. Tolerating PO. Reports pain is improving and feels comfortable with discharge. Discussed labs results and importance of outpt f/u with her surgeon and GI specialist. Pt agreeable to plan.   [JR]    Clinical Course User Index [CG] Kinnie Feil, PA-C [JR] Laritza Vokes, [place] N, PA-C    Procedures        Mauricio Dahlen, [place] N, [place] 02/21/19 1657    Hayden Rasmussen, MD 02/21/19 [phone]

## 2019-02-21 NOTE — ED Provider Notes (Addendum)
[facility] DEPT Provider Note   CSN: [id number] Arrival date & time: 02/21/19  1145     History   Chief Complaint Chief Complaint  Patient presents with  . Emesis  . Post-op Problem    HPI Angel Price is a 53 y.o. female with pertinent past medical history of gastrectomy with Roux-en-Y anastomosis, multiple abdominal surgeries, peptic ulcer disease, daily alcohol use, GERD, recurrent abdominal/surgical wall abscesses, gastroparesis, chronic pain syndrome presents to the ER for evaluation of sudden onset, constant, 10/10 generalized abdominal pain mostly located around surgical incision.  She has some radiation into her low back.  Has associated nausea, nonbilious nonbloody emesis, diarrhea.  Onset 3 days ago.  No hematemesis, coffee-ground emesis, melena or blood in her stool.  Other associated symptoms include "foamy" urine but no dysuria, urinary frequency or urgency, fever.  Also reports decreased appetite, decreased oral intake due to ongoing nausea and vomiting.  Woke up this morning and had a new wound in her surgical incision that was draining pus and blood.  States she has 2 chronic wounds in her abdomen that usually always drain pus and this is a new wound.  Has been using Phenergan suppositories without any improvement.  States she does not take anything for pain at home because everything she tries to swallow makes her sick and throw up.  She threw up Tylenol.  Drinks 2-3 beers daily, her last alcoholic beverage was yesterday.  She has an appointment with GI next month.  No fever, congestion, sore throat, chest pain or shortness of breath or cough.  No sick contacts.  No recent antibiotics.    HPI  Past Medical History:  Diagnosis Date  . Abscess    soft tissue  . Adrenal mass ([place])   . Alcohol abuse   . Anxiety   . Blood transfusion without reported diagnosis   . Chronic abdominal pain   . Chronic wound infection of abdomen   . Colon  polyp    colonoscopy 04/2014  . Depression   . Diverticulosis    colonoscopy 04/2014 moderat pan colonic  . Drug-seeking behavior   . Gastritis    EGD 05/2014  . Gastroparesis Nov 2015  . GERD (gastroesophageal reflux disease)   . Hemorrhoid    internal large  . Hiatal hernia   . History of Billroth II operation   . Hypertension   . Lung nodule    CT 02/2014 needs repeat 1 month  . Lung nodule < 6cm on CT 04/25/2014  . Lupus ([place])   . Malingering   . Nausea and vomiting    chronic, recurrent  . Pancreatitis   . Schatzki's ring    patent per EGD 04/2014  . Sickle cell trait ([place])   . Suicide attempt ([place])   . 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 Abnormal CT scan, colon 12/09/2018  . Abnormal computed tomography of cecum and terminal ileum 12/09/2018  . Cocaine abuse ([place])   . Colitis 11/11/2018  . Hemorrhoidal skin tag   . SBO (small bowel obstruction) ([place])   . Non-intractable vomiting with nausea   . Folate deficiency 05/11/2017  . Abdominal wall fistula   . Cellulitis of abdominal wall 01/02/2017  . Acute left hemiparesis ([place]) 12/05/2016  . Malingering 12/05/2016  . Weakness of left lower extremity 12/02/2016  . CVA (cerebral vascular accident) ([place]) 11/23/2016  . Bright red  rectal bleeding 08/22/2016  . Hypotension due to blood loss   . Acute GI bleeding 05/24/2016  . Dieulafoy lesion of duodenum   . History of Billroth II operation   . Gastrointestinal hemorrhage 05/22/2016  . Absolute anemia   . Acute pancreatitis 10/01/2015  . Abnormal CT scan of lung 10/01/2015  . Alcohol intoxication ([place])   . Left-sided weakness   . Psychosomatic factor in physical condition   . Upper GI bleed   . Diverticulosis of colon with hemorrhage   . Chronic wound infection of abdomen 12/22/2014  . Thyroid disease 12/22/2014  . HTN (hypertension) 12/22/2014  . Ataxia 11/01/2014  . Hemorrhoids 04/26/2014  .  Lung nodule 04/26/2014  . Diverticulosis   . Gastritis   . Hiatal hernia   . Schatzki's ring   . Acute blood loss anemia 04/25/2014  . Sinus tachycardia 04/25/2014  . Hypokalemia 04/25/2014  . Hematemesis with nausea 04/25/2014  . Lung nodule < 6cm on CT 04/25/2014  . Intractable nausea and vomiting 04/24/2014  . Gastroparesis   . Abdominal pain 04/01/2014  . Chronic abdominal pain 01/21/2014  . Gastroenteritis 12/10/2013  . Chronic abdominal wound infection 08/16/2013  . MDD (major depressive disorder), recurrent episode, severe ([place]) 06/27/2013  . Wrist laceration 06/24/2013  . Diarrhea 05/07/2013  . Rectal bleeding 05/07/2013  . Abnormal LFTs 05/07/2013  . Adrenal mass, left ([place]) 05/07/2013  . Abdominal wall abscess at site of surgical wound 04/19/2013  . Abdominal wall abscess 04/18/2013  . Frequent headaches 03/30/2013  . Sleep difficulties 03/30/2013  . Essential hypertension, benign 03/30/2013  . History of cocaine abuse ([place]) 03/16/2013  . History of schizoaffective disorder 03/16/2013  . Bipolar disorder ([place]) 03/16/2013  . Personality disorder ([place]) 03/16/2013  . Tobacco abuse 03/16/2013  . Alcohol abuse 03/16/2013  . Palpitations 03/16/2013  . Poor vision 03/16/2013  . History of gastric bypass 03/16/2013  . Status post hysterectomy with oophorectomy 03/16/2013  . Hypothyroid 03/16/2013  . Lupus ([place]) 03/16/2013    Past Surgical History:  Procedure Laterality Date  . ABDOMINAL HYSTERECTOMY  2013   [place]  . ABDOMINAL SURGERY    . ADRENALECTOMY Right   . AGILE CAPSULE N/A 01/05/2015   Procedure: AGILE CAPSULE;  Surgeon: Daneil Dolin, MD;  [facility];  Service: Endoscopy;  Laterality: N/A;  0700  . Billroth II procedure      [place], first 2000, 2005/2006.  Marland Kitchen BIOPSY  05/20/2013   Procedure: BIOPSIES OF ASCENDING AND SIGMOID COLON;  Surgeon: Daneil Dolin, MD;  Location: [facility] OR[facility];  Service: Endoscopy;;  . BIOPSY  04/26/2014   Procedure:  BIOPSIES;  Surgeon: Danie Binder, MD;  Location: [facility];  Service: Endoscopy;;  . BIOPSY  12/24/2018   Procedure: BIOPSY;  Surgeon: Daneil Dolin, MD;  Location: [facility];  Service: Endoscopy;;  colon  . CHOLECYSTECTOMY    . COLONOSCOPY     in [place]  . COLONOSCOPY WITH PROPOFOL N/A 05/20/2013   Dr.Rourk- inadequate prep, normal appearing rectum, grossly normal colon aside from pancolonic diverticula, normal terminal ileum bx= unremarkable colonic mucosa. Due for early interval 2016.   Marland Kitchen COLONOSCOPY WITH PROPOFOL N/A 04/26/2014   [id number] ileum/one colon polyp removed/moderate pan-colonic diverticulosis/large internal hemorrhoids  . COLONOSCOPY WITH PROPOFOL N/A 12/23/2014   Dr.Rourk- minimal internal hemorrhoids, pancolonic diverticulosis  . COLONOSCOPY WITH PROPOFOL N/A 08/23/2016   Procedure: COLONOSCOPY WITH PROPOFOL;  Surgeon: Danie Binder, MD;  [facility];  Service: Endoscopy;  Laterality: N/A;  . COLONOSCOPY WITH PROPOFOL N/A 12/24/2018   Procedure: COLONOSCOPY WITH PROPOFOL;  Surgeon: Daneil Dolin, MD;  [facility];  Service: Endoscopy;  Laterality: N/A;  7:30am ileoscopy  . DEBRIDEMENT OF ABDOMINAL WALL ABSCESS N/A 02/08/2013   Procedure: DEBRIDEMENT OF ABDOMINAL WALL ABSCESS;  Surgeon: Jamesetta So, MD;  Location: [facility];  Service: General;  Laterality: N/A;  . ESOPHAGOGASTRODUODENOSCOPY (EGD) WITH PROPOFOL N/A 05/20/2013   Dr.Rourk- s/p prior gastric surgery with normal esophagus, residual gastric mucosa and patent efferent limb  . ESOPHAGOGASTRODUODENOSCOPY (EGD) WITH PROPOFOL N/A 02/03/2014   Dr. Gala Romney:  s/p hemigastrectomy with retained gastric contents. Residual gastric mucosa and efferent limb appeared normal otherwise. Query gastroparesis.   Marland Kitchen ESOPHAGOGASTRODUODENOSCOPY (EGD) WITH PROPOFOL N/A 04/26/2014   [id number] ring/small HH/mild non-erosive gasrtitis/normal anastomosis  . ESOPHAGOGASTRODUODENOSCOPY (EGD) WITH PROPOFOL N/A 12/23/2014    Dr.Rourk- s/p prior hemigastrctomy, active oozing from anastomotic suture site, hemostasis achieved  . ESOPHAGOGASTRODUODENOSCOPY (EGD) WITH PROPOFOL N/A 05/23/2016   Dr. 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 ESOPHAGOGASTRODUODENOSCOPY (EGD) WITH PROPOFOL N/A 05/11/2017   Procedure: ESOPHAGOGASTRODUODENOSCOPY (EGD) WITH PROPOFOL;  Surgeon: Daneil Dolin, MD;  [facility];  Service: Endoscopy;  Laterality: N/A;  . HEMORRHOID SURGERY N/A 06/18/2017   Procedure: THREE COLUMN EXTENSIVE HEMORRHOIDECTOMY;  Surgeon: Virl Cagey, MD;  Location: [facility];  Service: General;  Laterality: N/A;  . INCISION AND DRAINAGE ABSCESS N/A 01/06/2017   Procedure: INCISION AND DRAINAGE ABDOMINAL WALL ABSCESS;  Surgeon: Aviva Signs, MD;  Location: [facility];  Service: General;  Laterality: N/A;  . POLYPECTOMY  12/24/2018   Procedure: POLYPECTOMY;  Surgeon: Daneil Dolin, MD;  [facility];  Service: Endoscopy;;  colon  . tendon repar Right    wrist  . WOUND EXPLORATION Right 06/24/2013   Procedure: exploration of traumatic wound right wrist;  Surgeon: Tennis Must, MD;  Location: [facility];  Service: Orthopedics;  Laterality: Right;     OB History    Gravida  4   Para  4   Term  4   Preterm      AB      Living  3     SAB      TAB      Ectopic      Multiple      Live Births               Home Medications    Prior to Admission medications   Medication Sig Start Date End Date Taking? Authorizing Provider  amLODipine (NORVASC) 10 MG tablet Take 0.5 tablets (5 mg total) by mouth daily. 01/03/17  Yes Patrecia Pour, Christean Grief, MD  atenolol (TENORMIN) 25 MG tablet Take 1 tablet (25 mg total) by mouth daily. 11/12/18  Yes Roxan Hockey, MD  dicyclomine (BENTYL) 10 MG capsule Take 1 capsule (10 mg total) by mouth 4 (four) times daily -  before meals and at bedtime. For diarrhea  and abdominal cramps 12/09/18  Yes Mahala Menghini, PA-C  escitalopram (LEXAPRO) 20 MG tablet Take 20 mg daily by mouth.   Yes [provider]  folic acid (FOLVITE) 1 MG tablet Take 1 tablet (1 mg total) by mouth daily. 11/13/18  Yes Emokpae, Courage, MD  omeprazole (PRILOSEC) 20 MG capsule Take 1 capsule (20 mg total) by mouth daily for 14 days. 01/27/19 02/21/19 Yes McDonald, Mia A,  PA-C  ondansetron (ZOFRAN ODT) 4 MG disintegrating tablet Take 1 tablet (4 mg total) by mouth every 8 (eight) hours as needed for nausea. 11/12/18  Yes Roxan Hockey, MD  promethazine (PHENERGAN) 25 MG suppository Place 1 suppository (25 mg total) rectally every 6 (six) hours as needed for nausea or vomiting. 01/27/19  Yes McDonald, Mia A, PA-C  sucralfate (CARAFATE) 1 g tablet Take 1 tablet (1 g total) by mouth 4 (four) times daily for 14 days. 01/27/19 02/21/19 Yes McDonald, Mia A, PA-C  thiamine 100 MG tablet Take 1 tablet (100 mg total) by mouth daily. 11/13/18  Yes Roxan Hockey, MD    Family History Family History  Problem Relation Age of Onset  . Brain cancer Son   . Schizophrenia Son   . Cancer Son        brain  . Lung cancer Father   . Cancer Father        mets  . Drug abuse Mother   . Breast cancer Maternal Aunt   . Bipolar disorder Maternal Aunt   . Drug abuse Maternal Aunt   . Colon cancer Maternal Grandmother        late 75s, early 23s  . Drug abuse Sister   . Drug abuse Brother   . Bipolar disorder Paternal Grandfather   . Bipolar disorder Cousin   . Liver disease Neg Hx     Social History Social History   Tobacco Use  . Smoking status: Current Every Day Smoker    Packs/day: 0.50    Years: 35.00    Pack years: 17.50    Types: Cigarettes  . Smokeless tobacco: Never Used  Substance Use Topics  . Alcohol use: Not Currently    Alcohol/week: 0.0 standard drinks    Comment: 2-3 cans every other day; denied 12/09/18  . Drug use: No    Types: Cocaine    Comment: last used 4  days ago on 11/06/18; denied 12/09/18     Allergies   Codeine, Morphine and related, and Reglan [metoclopramide]   Review of Systems Review of Systems  Constitutional: Positive for activity change.  Gastrointestinal: Positive for abdominal pain, diarrhea, nausea and vomiting.  All other systems reviewed and are negative.    Physical Exam Updated Vital Signs BP 114/78   Pulse 81   Temp 97.7 F (36.5 C) (Oral)   Resp 16   SpO2 99%   Physical Exam Vitals signs and nursing note reviewed.  Constitutional:      Appearance: She is well-developed.     Comments: Non toxic in NAD  HENT:     Head: Normocephalic and atraumatic.     Nose: Nose normal.  Eyes:     Conjunctiva/sclera: Conjunctivae normal.  Neck:     Musculoskeletal: Normal range of motion.  Cardiovascular:     Rate and Rhythm: Normal rate and regular rhythm.  Pulmonary:     Effort: Pulmonary effort is normal.     Breath sounds: Normal breath sounds.  Abdominal:     General: Bowel sounds are normal.     Palpations: Abdomen is soft.     Tenderness: There is abdominal tenderness.     Comments: Large midline surgical incision noted.  Patient is very jumpy during exam, mild pressure and degrees of the skin makes her jump.  Generalized tenderness mostly around surgical incision.  Abdomen is soft.  No guarding or rigidity.  Bilateral CVA and lower lumbar muscular tenderness with percussion and palpation.  No suprapubic  tenderness.  Negative Murphy's and McBurney's.  Active BS to lower quadrants.  Musculoskeletal: Normal range of motion.  Skin:    General: Skin is warm and dry.     Capillary Refill: Capillary refill takes less than 2 seconds.  Neurological:     Mental Status: She is alert.  Psychiatric:        Behavior: Behavior normal.    ED Treatments / Results  Labs (all labs ordered are listed, but only abnormal results are displayed) Labs Reviewed  COMPREHENSIVE METABOLIC PANEL - Abnormal; Notable for the  following components:      Result Value   Glucose, Bld 126 (*)    BUN 22 (*)    Calcium 8.7 (*)    Total Protein 8.3 (*)    AST 56 (*)    ALT 58 (*)    All other components within normal limits  CBC - Abnormal; Notable for the following components:   WBC 3.1 (*)    All other components within normal limits  LIPASE, BLOOD  URINALYSIS, ROUTINE W REFLEX MICROSCOPIC  I-STAT BETA HCG BLOOD, ED ([facility], [facility], [facility] ONLY)    EKG None  Radiology No results found.  Procedures Procedures (including critical care time)  Medications Ordered in ED Medications  sodium chloride flush (NS) 0.9 % injection 3 mL (has no administration in time range)  promethazine (PHENERGAN) injection 12.5 mg (12.5 mg Intravenous Given 02/21/19 1455)  sodium chloride 0.9 % bolus 500 mL (0 mLs Intravenous Stopped 02/21/19 1541)  famotidine (PEPCID) IVPB 20 mg premix (0 mg Intravenous Stopped 02/21/19 1541)  sucralfate (CARAFATE) tablet 1 g (1 g Oral Given 02/21/19 1540)  oxyCODONE-acetaminophen (PERCOCET/ROXICET) 5-325 MG per tablet 1 tablet (1 tablet Oral Given 02/21/19 1540)     Initial Impression / Assessment and Plan / ED Course  I have reviewed the triage vital signs and the nursing notes.  Pertinent labs & imaging results that were available during my care of the patient were reviewed by me and considered in my medical decision making (see chart for details).  Clinical Course as of Feb 20 1800  Sun Feb 21, 2019  1428 Dr Dudley Major for GI and Dr Arnoldo Morale for abdominal surgery    [CG]  1652 Pt evaluated after interventions. Tolerating PO. Reports pain is improving and feels comfortable with discharge. Discussed labs results and importance of outpt f/u with her surgeon and GI specialist. Pt agreeable to plan.   [JR]    Clinical Course User Index [CG] Kinnie Feil, PA-C [JR] Robinson, [place] N, PA-C   EMR reviewed to assist with MDM.  ER visit 1 month ago for same complaints.  Last CT A/P done August/2020 was  nonacute.  Had colonoscopy October/2020 that showed diverticulosis and rectal polyp that was removed otherwise nonacute.  EGD February 2019 hiatal hernia but otherwise normal.  Highest on DDX is acute on chronic abdominal pain related to GERD/gastritis.  She continues to drink alcohol daily.  Also possibility of viral gastroenteritis.  She has complex GI and surgical history and technically at risk for SBO and other complications however her abdominal pain appears to be chronic for several weeks.  Her abdominal exam today is nonfocal and no peritonitis.    ER work-up reviewed is vastly reassuring.  No leukocytosis.  Normal creatinine.  Mildly, chronic elevated AST/ALT likely related to EtOH use.  Lipase is normal.  Urinalysis is clean.  Given chronicity of symptoms, benign work-up here, I do not think higher level of  imaging is needed emergently.  Carafate, Phenergan, Pepcid and IV fluids ordered.  Will attempt oral challenge, reassess.  1600: Patient reevaluated, reports ongoing abdominal tenderness only mildly improved.  She is however tolerating fluids.  Repeat abdominal exam essentially unchanged.  Will handoff to oncoming ED PA who will reassess and follow-up and fluid challenge.  Anticipate discharge with close GI follow-up.  She already has Protonix, Carafate.  Encouraged EtOH cessation.  Low threshold for imaging given her complex past medical/surgical history if no clinical improvement or decline.  Final Clinical Impressions(s) / ED Diagnoses   Final diagnoses:  Nausea vomiting and diarrhea    ED Discharge Orders    None       Kinnie Feil, PA-C 02/21/19 1607    Kinnie Feil, PA-C 02/21/19 1802    Hayden Rasmussen, MD 02/21/19 [phone]

## 2019-02-21 NOTE — ED Triage Notes (Signed)
Pt presents with c/o nausea and vomiting for 3 days. Pt reports she also had abdominal surgery one year ago and the incision site has now opened up and is draining pus.

## 2019-02-22 ENCOUNTER — Telehealth: Payer: Self-pay | Admitting: *Deleted

## 2019-02-22 NOTE — Telephone Encounter (Signed)
Pt having a lot of nausea.  Made a fu appt with [place] from [facility].  Appt tomorrow but pt wants to know if we can send RX for nausea medicine (suppositories)  to [facility]. Today.   [phone]

## 2019-02-22 NOTE — Telephone Encounter (Signed)
Lm with pts spouse pt will call back.

## 2019-02-22 NOTE — Progress Notes (Signed)
Primary Care Physician:  Rosita Fire, MD Primary GI: Dr. Gala Romney   Chief Complaint  Patient presents with  . Nausea    with vomiting lasting all day x friday. reports have not taken any medications since friday because she vomits it all up  . Diarrhea    x friday  . Abdominal Pain    upper abd  . open wound on abd    reports went to ED sunday and advised to come here to be seen. has pus draining from site.    HPI:   Angel Price is a 53 y.o. 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 Due to CT findings of colitis in August 2020, she underwent colonoscopy. Pancolonic diverticulosis, normal TI, one 5 mm polyp in rectum (tubular adenoma). Somewhat friable hemorrhagic mucosa in area of IC valve/cecum, possibly related to scope trauma s/p biopsy. She is followed by Dr. Ladona Horns in [place] due to chroni fistulas. Last seen by Dr. Ladona Horns in Sept 2020 with plans for updated CT to rule out abscess vs fistulas. Didn't complete CT. Hasn't been seen in follow-up with Gen Surg.    Notes acute onset of periumbilical abdominal pain on Friday, with associated N/V. States at this time she noted a new opening below to the umbilicus. This has been draining purulent and bloody drainage. Two other chronic openings with intermittent drainage but right-sided with increased purulent drainage. No fever/chills. No food or drink since Friday. Presented to the ED on Sunday and was able to tolerate small amounts of liquids after supportive measures. She states this was short-lived, and now back to persistent pain, N/V. No ETOH, no drug use.  Phenergan suppositories not helpful any longer. Urine is orange. Can't keep any medicine down. No appetite. Not taking any prescribed medications.  Unable to tolerate pedialyte or other liquids.    Newly elevated transaminases and alk phos 3 weeks ago, with AST 151, ALT 79, Alk Phos 155. 12/6 improvement in transaminases to AST 56, ALT 58.     Past Medical History:  Diagnosis Date  . Abscess    soft tissue  . Adrenal mass ([place])   . Alcohol abuse   . Anxiety   . Blood transfusion without reported diagnosis   . Chronic abdominal pain   . Chronic wound infection of abdomen   . Colon polyp    colonoscopy 04/2014  . Depression   . Diverticulosis    colonoscopy 04/2014 moderat pan colonic  . Drug-seeking behavior   . Gastritis    EGD 05/2014  . Gastroparesis Nov 2015  . GERD (gastroesophageal reflux disease)   . Hemorrhoid    internal large  . Hiatal hernia   . History of Billroth II operation   . Hypertension   . Lung nodule    CT 02/2014 needs repeat 1 month  . Lung nodule < 6cm on CT 04/25/2014  . Lupus ([name])   . Malingering   . Nausea and vomiting    chronic, recurrent  . Pancreatitis   . Schatzki's ring    patent per EGD 04/2014  . Sickle cell trait ([place])   . Suicide attempt ([place])   . 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 ABDOMINAL HYSTERECTOMY  2013  [place]  . ABDOMINAL SURGERY    . ADRENALECTOMY Right   . AGILE CAPSULE N/A 01/05/2015   Procedure: AGILE CAPSULE;  Surgeon: Daneil Dolin, MD;  [facility];  Service: Endoscopy;  Laterality: N/A;  0700  . Billroth II procedure      [place], first 2000, 2005/2006.  Marland Kitchen BIOPSY  05/20/2013   Procedure: BIOPSIES OF ASCENDING AND SIGMOID COLON;  Surgeon: Daneil Dolin, MD;  Location: [facility];  Service: Endoscopy;;  . BIOPSY  04/26/2014   Procedure: BIOPSIES;  Surgeon: Danie Binder, MD;  Location: [facility];  Service: Endoscopy;;  . BIOPSY  12/24/2018   Procedure: BIOPSY;  Surgeon: Daneil Dolin, MD;  [facility];  Service: Endoscopy;;  colon  . CHOLECYSTECTOMY    . COLONOSCOPY      in [place]  . COLONOSCOPY WITH PROPOFOL N/A 05/20/2013   Dr.Rourk- inadequate prep, normal appearing rectum, grossly normal colon aside from pancolonic diverticula, normal terminal ileum bx= unremarkable colonic mucosa. Due for early interval 2016.   Marland Kitchen COLONOSCOPY WITH PROPOFOL N/A 04/26/2014   CBS:WHQPRF ileum/one colon polyp removed/moderate pan-colonic diverticulosis/large internal hemorrhoids  . COLONOSCOPY WITH PROPOFOL N/A 12/23/2014   Dr.Rourk- minimal internal hemorrhoids, pancolonic diverticulosis  . COLONOSCOPY WITH PROPOFOL N/A 08/23/2016   Procedure: COLONOSCOPY WITH PROPOFOL;  Surgeon: Danie Binder, MD;  [facility];  Service: Endoscopy;  Laterality: N/A;  . COLONOSCOPY WITH PROPOFOL N/A 12/24/2018   Pancolonic diverticulosis, normal TI, one 5 mm polyp in rectum (tubular adenoma). Somewhat friable hemorrhagic mucosa in area of IC valve/cecum, possibly related to scope trauma s/p biopsy.   . DEBRIDEMENT OF ABDOMINAL WALL ABSCESS N/A 02/08/2013   Procedure: DEBRIDEMENT OF ABDOMINAL WALL ABSCESS;  Surgeon: Jamesetta So, MD;  Location: [facility];  Service: General;  Laterality: N/A;  . ESOPHAGOGASTRODUODENOSCOPY (EGD) WITH PROPOFOL N/A 05/20/2013   Dr.Rourk- s/p prior gastric surgery with normal esophagus, residual gastric mucosa and patent efferent limb  . ESOPHAGOGASTRODUODENOSCOPY (EGD) WITH PROPOFOL N/A 02/03/2014   Dr. Gala Romney:  s/p hemigastrectomy with retained gastric contents. Residual gastric mucosa and efferent limb appeared normal otherwise. Query gastroparesis.   Marland Kitchen ESOPHAGOGASTRODUODENOSCOPY (EGD) WITH PROPOFOL N/A 04/26/2014   FMB:[id number] ring/small HH/mild non-erosive gasrtitis/normal anastomosis  . ESOPHAGOGASTRODUODENOSCOPY (EGD) WITH PROPOFOL N/A 12/23/2014   Dr.Rourk- s/p prior hemigastrctomy, active oozing from anastomotic suture site, hemostasis achieved  . ESOPHAGOGASTRODUODENOSCOPY (EGD) WITH PROPOFOL N/A 05/23/2016   Dr. 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 ESOPHAGOGASTRODUODENOSCOPY (EGD) WITH PROPOFOL N/A 05/11/2017   Procedure: ESOPHAGOGASTRODUODENOSCOPY (EGD) WITH PROPOFOL;  Surgeon: Daneil Dolin, MD;  [facility];  Service: Endoscopy;  Laterality: N/A;  . HEMORRHOID SURGERY N/A 06/18/2017   Procedure: THREE COLUMN EXTENSIVE HEMORRHOIDECTOMY;  Surgeon: Virl Cagey, MD;  Location: [facility];  Service: General;  Laterality: N/A;  . INCISION AND DRAINAGE ABSCESS N/A 01/06/2017   Procedure: INCISION AND DRAINAGE ABDOMINAL WALL ABSCESS;  Surgeon: Aviva Signs, MD;  Location: [facility];  Service: General;  Laterality: N/A;  . POLYPECTOMY  12/24/2018   Procedure: POLYPECTOMY;  Surgeon: Daneil Dolin, MD;  [facility];  Service: Endoscopy;;  colon  . tendon repar Right    wrist  . WOUND EXPLORATION Right 06/24/2013   Procedure: exploration of traumatic wound right wrist;  Surgeon: Tennis Must, MD;  Location: [facility];  Service: Orthopedics;  Laterality: Right;  Current Outpatient Medications  Medication Sig Dispense Refill  . amLODipine (NORVASC) 10 MG tablet Take 0.5 tablets (5 mg total) by mouth daily. (Patient not taking: Reported on 02/23/2019) 30 tablet 0  . atenolol (TENORMIN) 25 MG tablet Take 1 tablet (25 mg total) by mouth daily. (Patient not taking: Reported on 02/23/2019) 30 tablet 2  . dicyclomine (BENTYL) 10 MG capsule Take 1 capsule (10 mg total) by mouth 4 (four) times daily -  before meals and at bedtime. For diarrhea and abdominal cramps (Patient not taking: Reported on 02/23/2019) 120 capsule 1  . escitalopram (LEXAPRO) 20 MG tablet Take 20 mg daily by mouth.    . folic acid (FOLVITE) 1 MG tablet Take 1 tablet (1 mg total) by mouth daily. (Patient not taking: Reported on 02/23/2019) 30 tablet 2  . omeprazole (PRILOSEC) 20 MG capsule Take 1 capsule (20 mg total) by mouth daily for 14 days. (Patient not taking:  Reported on 02/23/2019) 14 capsule 0  . ondansetron (ZOFRAN ODT) 4 MG disintegrating tablet Take 1 tablet (4 mg total) by mouth every 8 (eight) hours as needed for nausea. (Patient not taking: Reported on 02/23/2019) 10 tablet 0  . promethazine (PHENERGAN) 25 MG suppository Place 1 suppository (25 mg total) rectally every 6 (six) hours as needed for nausea or vomiting. (Patient not taking: Reported on 02/23/2019) 12 each 0  . sucralfate (CARAFATE) 1 g tablet Take 1 tablet (1 g total) by mouth 4 (four) times daily for 14 days. (Patient not taking: Reported on 02/23/2019) 56 tablet 0  . thiamine 100 MG tablet Take 1 tablet (100 mg total) by mouth daily. (Patient not taking: Reported on 02/23/2019) 30 tablet 2   No current facility-administered medications for this visit.     Allergies as of 02/23/2019 - Review Complete 02/23/2019  Allergen Reaction Noted  . Codeine Hives 02/06/2013  . Morphine and related Hives 02/06/2013  . Reglan [metoclopramide] Other (See Comments) 08/08/2016    Family History  Problem Relation Age of Onset  . Brain cancer Son   . Schizophrenia Son   . Cancer Son        brain  . Lung cancer Father   . Cancer Father        mets  . Drug abuse Mother   . Breast cancer Maternal Aunt   . Bipolar disorder Maternal Aunt   . Drug abuse Maternal Aunt   . Colon cancer Maternal Grandmother        late 38s, early 43s  . Drug abuse Sister   . Drug abuse Brother   . Bipolar disorder Paternal Grandfather   . Bipolar disorder Cousin   . Liver disease Neg Hx     Social History   Socioeconomic History  . Marital status: Legally Separated    Spouse name: Not on file  . Number of children: Not on file  . Years of education: Not on file  . Highest education level: Not on file  Occupational History  . Not on file  Social Needs  . Financial resource strain: Not on file  . Food insecurity    Worry: Not on file    Inability: Not on file  . Transportation needs    Medical:  Not on file    Non-medical: Not on file  Tobacco Use  . Smoking status: Current Every Day Smoker    Packs/day: 0.50    Years: 35.00    Pack years: 17.50    Types: Cigarettes  .  Smokeless tobacco: Never Used  Substance and Sexual Activity  . Alcohol use: Not Currently    Alcohol/week: 0.0 standard drinks    Comment: 2-3 cans every other day; denied 12/09/18  . Drug use: No    Types: Cocaine    Comment: last used 4 days ago on 11/06/18; denied 12/09/18  . Sexual activity: Yes    Birth control/protection: Surgical  Lifestyle  . Physical activity    Days per week: Not on file    Minutes per session: Not on file  . Stress: Not on file  Relationships  . Social Herbalist on phone: Not on file    Gets together: Not on file    Attends religious service: Not on file    Active member of club or organization: Not on file    Attends meetings of clubs or organizations: Not on file    Relationship status: Not on file  Other Topics Concern  . Not on file  Social History Narrative  . Not on file    Review of Systems: As mentioned in HPI  Physical Exam: BP (!) 131/99   Pulse 84   Temp (!) 97.5 F (36.4 C) (Oral)   Ht '5\' 3"'  (1.6 m)   Wt 130 lb 6.4 oz (59.1 kg)   BMI 23.10 kg/m  General:   Alert and oriented. Acutely ill and fatigued.  Head:  Normocephalic and atraumatic. Eyes:  Conjuctiva clear without scleral icterus. Mouth:  Oral mucosa pink. Lips dry.  Abdomen:  +BS, soft, TTP periumbilically. Patient flinching with soft palpation. Chronic fistula openings above umbilicus to the right with milky drainage and below umbilicus to the left with serous drainage. New opening just below umbilicus to right of midline with pinpoint opening and purulent drainage Extremities:  Without edema. Neurologic:  Alert and  oriented x4 Psych:  Alert and cooperative.   ASSESSMENT/PLAN: SALEENA TAMAS is a 53 y.o. female presenting today with long-standing history of known abdominal  wall fistulas, now with new opening below umbilicus to right of midline that was noted on Friday, coinciding with acute abdominal pain, N/V. Purulent drainage noted on exam today. Additionally, chronic fistulae with increased drainage from baseline. Notably tender on exam. ED presentation on 12/6 with only minimal improvement with supportive measures. Clinically dehydrated; she notes inability to hold down any liquids and has not taken her medication for several days as she has refractory N/V. Phenergan suppositories have not been helpful.  In this scenario, we are unable to provide supportive care as outpatient. Would benefit from updated CT. Anticipate 23 hour obs, as she appears dehydrated and has not been able to take anything orally. She has been seen by Dr. Arnoldo Morale in the past due to known fistulae, most recently Dr. Ladona Horns in [place]. Patient instructed to present to the ED. I contacted Magda Paganini [facility]) to update.   Annitta Needs, PhD, ANP-BC [facility]

## 2019-02-22 NOTE — Telephone Encounter (Signed)
Pt returned call. Pt went to the ED yesterday for nausea/ vomiting x 2 days and was given antibiotics and pain meds. Pt is requesting some Phenergan suppositories be sent into her pharmacy today,  prior to her appointment tomorrow 02/23/2019 with [facility]. Pt states she isn't able to eat and keep anything down. Pt states she had 3 open areas with blood and infection when she was seen on 11/2018. Pt says she developed a 4th sport last week that is actively bleeding and has infection on her abdomen. Pt wants to be seen in office and doesn't want a virtual appointment.

## 2019-02-23 ENCOUNTER — Ambulatory Visit: Payer: Medicare Other | Admitting: Gastroenterology

## 2019-02-23 ENCOUNTER — Other Ambulatory Visit: Payer: Self-pay

## 2019-02-23 ENCOUNTER — Encounter (HOSPITAL_COMMUNITY): Payer: Self-pay | Admitting: Emergency Medicine

## 2019-02-23 ENCOUNTER — Emergency Department (HOSPITAL_COMMUNITY): Payer: Medicare Other

## 2019-02-23 ENCOUNTER — Inpatient Hospital Stay (HOSPITAL_COMMUNITY)
Admission: EM | Admit: 2019-02-23 | Discharge: 2019-02-26 | DRG: 603 | Disposition: A | Discharge status: Home Health | Payer: Medicare Other | Attending: Hospitalist | Admitting: Hospitalist

## 2019-02-23 ENCOUNTER — Encounter: Payer: Self-pay | Admitting: Gastroenterology

## 2019-02-23 VITALS — BP 131/99 | HR 84 | Temp 97.5°F | Ht 63.0 in | Wt 130.4 lb

## 2019-02-23 DIAGNOSIS — R109 Unspecified abdominal pain: Secondary | ICD-10-CM

## 2019-02-23 DIAGNOSIS — S31109A Unspecified open wound of abdominal wall, unspecified quadrant without penetration into peritoneal cavity, initial encounter: Secondary | ICD-10-CM

## 2019-02-23 DIAGNOSIS — E039 Hypothyroidism, unspecified: Secondary | ICD-10-CM | POA: Diagnosis present

## 2019-02-23 DIAGNOSIS — L02211 Cutaneous abscess of abdominal wall: Secondary | ICD-10-CM | POA: Diagnosis present

## 2019-02-23 DIAGNOSIS — F17210 Nicotine dependence, cigarettes, uncomplicated: Secondary | ICD-10-CM | POA: Diagnosis present

## 2019-02-23 DIAGNOSIS — G8929 Other chronic pain: Secondary | ICD-10-CM | POA: Diagnosis present

## 2019-02-23 DIAGNOSIS — L089 Local infection of the skin and subcutaneous tissue, unspecified: Secondary | ICD-10-CM

## 2019-02-23 DIAGNOSIS — K861 Other chronic pancreatitis: Secondary | ICD-10-CM | POA: Diagnosis present

## 2019-02-23 DIAGNOSIS — Z79899 Other long term (current) drug therapy: Secondary | ICD-10-CM

## 2019-02-23 DIAGNOSIS — G894 Chronic pain syndrome: Secondary | ICD-10-CM | POA: Diagnosis present

## 2019-02-23 DIAGNOSIS — Z20828 Contact with and (suspected) exposure to other viral communicable diseases: Secondary | ICD-10-CM | POA: Diagnosis present

## 2019-02-23 DIAGNOSIS — K219 Gastro-esophageal reflux disease without esophagitis: Secondary | ICD-10-CM | POA: Diagnosis present

## 2019-02-23 DIAGNOSIS — F1010 Alcohol abuse, uncomplicated: Secondary | ICD-10-CM | POA: Diagnosis present

## 2019-02-23 DIAGNOSIS — F1411 Cocaine abuse, in remission: Secondary | ICD-10-CM | POA: Diagnosis present

## 2019-02-23 DIAGNOSIS — Z885 Allergy status to narcotic agent status: Secondary | ICD-10-CM

## 2019-02-23 DIAGNOSIS — Z9884 Bariatric surgery status: Secondary | ICD-10-CM

## 2019-02-23 DIAGNOSIS — Z818 Family history of other mental and behavioral disorders: Secondary | ICD-10-CM

## 2019-02-23 DIAGNOSIS — F259 Schizoaffective disorder, unspecified: Secondary | ICD-10-CM | POA: Diagnosis present

## 2019-02-23 DIAGNOSIS — Z86010 Personal history of colonic polyps: Secondary | ICD-10-CM

## 2019-02-23 DIAGNOSIS — Z888 Allergy status to other drugs, medicaments and biological substances status: Secondary | ICD-10-CM

## 2019-02-23 DIAGNOSIS — Z8659 Personal history of other mental and behavioral disorders: Secondary | ICD-10-CM

## 2019-02-23 DIAGNOSIS — F17200 Nicotine dependence, unspecified, uncomplicated: Secondary | ICD-10-CM | POA: Diagnosis present

## 2019-02-23 DIAGNOSIS — L03311 Cellulitis of abdominal wall: Principal | ICD-10-CM | POA: Diagnosis present

## 2019-02-23 DIAGNOSIS — K3184 Gastroparesis: Secondary | ICD-10-CM | POA: Diagnosis present

## 2019-02-23 DIAGNOSIS — Z903 Acquired absence of stomach [part of]: Secondary | ICD-10-CM

## 2019-02-23 DIAGNOSIS — D573 Sickle-cell trait: Secondary | ICD-10-CM | POA: Diagnosis present

## 2019-02-23 DIAGNOSIS — K279 Peptic ulcer, site unspecified, unspecified as acute or chronic, without hemorrhage or perforation: Secondary | ICD-10-CM | POA: Diagnosis present

## 2019-02-23 DIAGNOSIS — F319 Bipolar disorder, unspecified: Secondary | ICD-10-CM | POA: Diagnosis present

## 2019-02-23 DIAGNOSIS — F419 Anxiety disorder, unspecified: Secondary | ICD-10-CM | POA: Diagnosis present

## 2019-02-23 DIAGNOSIS — F1410 Cocaine abuse, uncomplicated: Secondary | ICD-10-CM | POA: Diagnosis present

## 2019-02-23 DIAGNOSIS — Z720 Tobacco use: Secondary | ICD-10-CM | POA: Diagnosis present

## 2019-02-23 DIAGNOSIS — R112 Nausea with vomiting, unspecified: Secondary | ICD-10-CM | POA: Diagnosis present

## 2019-02-23 DIAGNOSIS — M329 Systemic lupus erythematosus, unspecified: Secondary | ICD-10-CM | POA: Diagnosis present

## 2019-02-23 DIAGNOSIS — I10 Essential (primary) hypertension: Secondary | ICD-10-CM | POA: Diagnosis present

## 2019-02-23 DIAGNOSIS — R9431 Abnormal electrocardiogram [ECG] [EKG]: Secondary | ICD-10-CM

## 2019-02-23 DIAGNOSIS — S31109D Unspecified open wound of abdominal wall, unspecified quadrant without penetration into peritoneal cavity, subsequent encounter: Secondary | ICD-10-CM

## 2019-02-23 LAB — URINALYSIS, ROUTINE W REFLEX MICROSCOPIC
Bilirubin Urine: NEGATIVE
Glucose, UA: NEGATIVE mg/dL
Hgb urine dipstick: NEGATIVE
Ketones, ur: 5 mg/dL — AB
Leukocytes,Ua: NEGATIVE
Nitrite: NEGATIVE
Protein, ur: NEGATIVE mg/dL
Specific Gravity, Urine: 1.038 — ABNORMAL HIGH (ref 1.005–1.030)
pH: 5 (ref 5.0–8.0)

## 2019-02-23 LAB — COMPREHENSIVE METABOLIC PANEL
ALT: 120 U/L — ABNORMAL HIGH (ref 0–44)
AST: 155 U/L — ABNORMAL HIGH (ref 15–41)
Albumin: 4.2 g/dL (ref 3.5–5.0)
Alkaline Phosphatase: 153 U/L — ABNORMAL HIGH (ref 38–126)
Anion gap: 14 (ref 5–15)
BUN: 8 mg/dL (ref 6–20)
CO2: 21 mmol/L — ABNORMAL LOW (ref 22–32)
Calcium: 8.9 mg/dL (ref 8.9–10.3)
Chloride: 101 mmol/L (ref 98–111)
Creatinine, Ser: 0.61 mg/dL (ref 0.44–1.00)
GFR calc Af Amer: >60 mL/min (ref >60)
GFR calc non Af Amer: >60 mL/min (ref >60)
Glucose, Bld: 83 mg/dL (ref 70–99)
Potassium: 4.1 mmol/L (ref 3.5–5.1)
Sodium: 136 mmol/L (ref 135–145)
Total Bilirubin: 1.4 mg/dL — ABNORMAL HIGH (ref 0.3–1.2)
Total Protein: 8.2 g/dL — ABNORMAL HIGH (ref 6.5–8.1)

## 2019-02-23 LAB — RAPID URINE DRUG SCREEN, HOSP PERFORMED
Amphetamines: NOT DETECTED
Barbiturates: NOT DETECTED
Benzodiazepines: NOT DETECTED
Cocaine: NOT DETECTED
Opiates: POSITIVE — AB
Tetrahydrocannabinol: NOT DETECTED

## 2019-02-23 LAB — CBC WITH DIFFERENTIAL/PLATELET
Abs Immature Granulocytes: 0 10*3/uL (ref 0.00–0.07)
Basophils Absolute: 0 10*3/uL (ref 0.0–0.1)
Basophils Relative: 1 %
Eosinophils Absolute: 0 10*3/uL (ref 0.0–0.5)
Eosinophils Relative: 1 %
HCT: 39.8 % (ref 36.0–46.0)
Hemoglobin: 13.9 g/dL (ref 12.0–15.0)
Immature Granulocytes: 0 %
Lymphocytes Relative: 55 %
Lymphs Abs: 1.7 10*3/uL (ref 0.7–4.0)
MCH: 28.3 pg (ref 26.0–34.0)
MCHC: 34.9 g/dL (ref 30.0–36.0)
MCV: 80.9 fL (ref 80.0–100.0)
Monocytes Absolute: 0.3 10*3/uL (ref 0.1–1.0)
Monocytes Relative: 9 %
Neutro Abs: 1 10*3/uL — ABNORMAL LOW (ref 1.7–7.7)
Neutrophils Relative %: 34 %
Platelets: 316 10*3/uL (ref 150–400)
RBC: 4.92 MIL/uL (ref 3.87–5.11)
RDW: 14.8 % (ref 11.5–15.5)
WBC: 3.1 10*3/uL — ABNORMAL LOW (ref 4.0–10.5)
nRBC: 0 % (ref 0.0–0.2)

## 2019-02-23 LAB — LIPASE, BLOOD: Lipase: 16 U/L (ref 11–51)

## 2019-02-23 LAB — LACTIC ACID, PLASMA
Lactic Acid, Venous: 2.4 mmol/L (ref 0.5–1.9)
Lactic Acid, Venous: 1.7 mmol/L (ref 0.5–1.9)

## 2019-02-23 MED ORDER — PANTOPRAZOLE SODIUM 40 MG IV SOLR
40.0000 mg | INTRAVENOUS | Status: DC
  Administered 2019-02-23 – 2019-02-25 (×3): 40 mg via INTRAVENOUS
  Filled 2019-02-23 (×3): qty 40

## 2019-02-23 MED ORDER — SODIUM CHLORIDE 0.9% FLUSH: 3.0000 mL | INTRAVENOUS | Status: DC | PRN

## 2019-02-23 MED ORDER — IOHEXOL 300 MG/ML  SOLN
100.0000 mL | Freq: Once | INTRAMUSCULAR | Status: AC | PRN
  Administered 2019-02-23: 100 mL via INTRAVENOUS

## 2019-02-23 MED ORDER — VANCOMYCIN HCL 10 G IV SOLR: 1500.0000 mg | INTRAVENOUS | Status: DC

## 2019-02-23 MED ORDER — BISACODYL 10 MG RE SUPP: 10.0000 mg | Freq: Every day | RECTAL | Status: DC | PRN

## 2019-02-23 MED ORDER — ONDANSETRON HCL 4 MG PO TABS: 4.0000 mg | ORAL_TABLET | Freq: Four times a day (QID) | ORAL | Status: DC | PRN

## 2019-02-23 MED ORDER — SODIUM CHLORIDE 0.9% FLUSH
3.0000 mL | Freq: Two times a day (BID) | INTRAVENOUS | Status: DC
  Administered 2019-02-23 – 2019-02-26 (×4): 3 mL via INTRAVENOUS

## 2019-02-23 MED ORDER — FAMOTIDINE IN NACL 20-0.9 MG/50ML-% IV SOLN
20.0000 mg | Freq: Once | INTRAVENOUS | Status: AC
  Administered 2019-02-23: 20 mg via INTRAVENOUS
  Filled 2019-02-23: qty 50

## 2019-02-23 MED ORDER — AMLODIPINE BESYLATE 5 MG PO TABS
5.0000 mg | ORAL_TABLET | Freq: Every day | ORAL | Status: DC
  Administered 2019-02-25 – 2019-02-26 (×2): 5 mg via ORAL
  Filled 2019-02-23 (×3): qty 1

## 2019-02-23 MED ORDER — HYDROMORPHONE HCL 1 MG/ML IJ SOLN
1.0000 mg | Freq: Once | INTRAMUSCULAR | Status: AC
  Administered 2019-02-23: 1 mg via INTRAVENOUS
  Filled 2019-02-23: qty 1

## 2019-02-23 MED ORDER — LACTATED RINGERS IV SOLN
INTRAVENOUS | Status: DC
  Administered 2019-02-23 – 2019-02-25 (×5): via INTRAVENOUS

## 2019-02-23 MED ORDER — VANCOMYCIN HCL 1.5 G IV SOLR
1500.0000 mg | INTRAVENOUS | Status: DC
  Filled 2019-02-23: qty 1500

## 2019-02-23 MED ORDER — FENTANYL CITRATE (PF) 100 MCG/2ML IJ SOLN
25.0000 ug | Freq: Once | INTRAMUSCULAR | Status: AC
  Administered 2019-02-23: 25 ug via INTRAVENOUS
  Filled 2019-02-23: qty 2

## 2019-02-23 MED ORDER — PROMETHAZINE HCL 25 MG/ML IJ SOLN
25.0000 mg | Freq: Once | INTRAMUSCULAR | Status: AC
  Administered 2019-02-23: 25 mg via INTRAVENOUS
  Filled 2019-02-23: qty 1

## 2019-02-23 MED ORDER — VANCOMYCIN HCL IN DEXTROSE 1-5 GM/200ML-% IV SOLN
1000.0000 mg | INTRAVENOUS | Status: DC
  Administered 2019-02-24 – 2019-02-26 (×3): 1000 mg via INTRAVENOUS
  Filled 2019-02-23 (×3): qty 200

## 2019-02-23 MED ORDER — SCOPOLAMINE 1 MG/3DAYS TD PT72
1.0000 | MEDICATED_PATCH | TRANSDERMAL | Status: DC
  Administered 2019-02-23: 1.5 mg via TRANSDERMAL
  Filled 2019-02-23 (×2): qty 1

## 2019-02-23 MED ORDER — VITAMIN B-1 100 MG PO TABS
100.0000 mg | ORAL_TABLET | Freq: Every day | ORAL | Status: DC
  Administered 2019-02-23 – 2019-02-26 (×4): 100 mg via ORAL
  Filled 2019-02-23 (×4): qty 1

## 2019-02-23 MED ORDER — FOLIC ACID 1 MG PO TABS
1.0000 mg | ORAL_TABLET | Freq: Every day | ORAL | Status: DC
  Administered 2019-02-24 – 2019-02-26 (×3): 1 mg via ORAL
  Filled 2019-02-23 (×3): qty 1

## 2019-02-23 MED ORDER — ONDANSETRON HCL 4 MG/2ML IJ SOLN
4.0000 mg | Freq: Four times a day (QID) | INTRAMUSCULAR | Status: DC | PRN
  Administered 2019-02-24 – 2019-02-25 (×3): 4 mg via INTRAVENOUS
  Filled 2019-02-23 (×3): qty 2

## 2019-02-23 MED ORDER — ATENOLOL 25 MG PO TABS
50.0000 mg | ORAL_TABLET | Freq: Every day | ORAL | Status: DC
  Administered 2019-02-25 – 2019-02-26 (×2): 50 mg via ORAL
  Filled 2019-02-23 (×3): qty 2

## 2019-02-23 MED ORDER — SODIUM CHLORIDE 0.9 % IV BOLUS
1000.0000 mL | Freq: Once | INTRAVENOUS | Status: AC
  Administered 2019-02-23: 1000 mL via INTRAVENOUS

## 2019-02-23 MED ORDER — VANCOMYCIN HCL IN DEXTROSE 1-5 GM/200ML-% IV SOLN
1000.0000 mg | Freq: Once | INTRAVENOUS | Status: DC
  Administered 2019-02-23: 1000 mg via INTRAVENOUS
  Filled 2019-02-23: qty 200

## 2019-02-23 MED ORDER — SODIUM CHLORIDE 0.9 % IV SOLN: 250.0000 mL | INTRAVENOUS | Status: DC | PRN

## 2019-02-23 MED ORDER — ONDANSETRON HCL 4 MG/2ML IJ SOLN
4.0000 mg | Freq: Once | INTRAMUSCULAR | Status: AC
  Administered 2019-02-23: 4 mg via INTRAVENOUS
  Filled 2019-02-23: qty 2

## 2019-02-23 MED ORDER — ACETAMINOPHEN 650 MG RE SUPP: 650.0000 mg | Freq: Four times a day (QID) | RECTAL | Status: DC | PRN

## 2019-02-23 MED ORDER — POLYETHYLENE GLYCOL 3350 17 G PO PACK: 17.0000 g | PACK | Freq: Every day | ORAL | Status: DC | PRN

## 2019-02-23 MED ORDER — SUCRALFATE 1 G PO TABS
1.0000 g | ORAL_TABLET | Freq: Three times a day (TID) | ORAL | Status: DC
  Administered 2019-02-23 – 2019-02-26 (×9): 1 g via ORAL
  Filled 2019-02-23 (×10): qty 1

## 2019-02-23 MED ORDER — ENOXAPARIN SODIUM 40 MG/0.4ML ~~LOC~~ SOLN
40.0000 mg | SUBCUTANEOUS | Status: DC
  Administered 2019-02-23 – 2019-02-25 (×3): 40 mg via SUBCUTANEOUS
  Filled 2019-02-23 (×3): qty 0.4

## 2019-02-23 MED ORDER — PROMETHAZINE HCL 25 MG RE SUPP: 25.0000 mg | Freq: Four times a day (QID) | RECTAL | Status: DC | PRN

## 2019-02-23 MED ORDER — ACETAMINOPHEN 325 MG PO TABS: 650.0000 mg | ORAL_TABLET | Freq: Four times a day (QID) | ORAL | Status: DC | PRN

## 2019-02-23 MED ORDER — DIPHENHYDRAMINE HCL 50 MG/ML IJ SOLN
12.5000 mg | Freq: Once | INTRAMUSCULAR | Status: AC
  Administered 2019-02-23: 12.5 mg via INTRAVENOUS
  Filled 2019-02-23: qty 1

## 2019-02-23 NOTE — Progress Notes (Signed)
Pharmacy Antibiotic Note  Angel Price is a 53 y.o. female admitted on 02/23/2019 with cellulitis.  Pharmacy has been consulted for Vancomycin dosing.  Plan: Vancomycin 1500mg  IV every 24 hours.  Goal trough 10-15 mcg/mL.  F/U clinical progress Monitor V/S, labs, and levels as indicated  Height: 5\' 3"  (160 cm) Weight: 130 lb (59 kg) IBW/kg (Calculated) : 52.4  Temp (24hrs), Avg:97.9 F (36.6 C), Min:97.5 F (36.4 C), Max:98.2 F (36.8 C)  Recent Labs  Lab 02/21/19 1207 02/23/19 1212 02/23/19 1429  WBC 3.1* 3.1*  --   CREATININE 0.66 0.61  --   LATICACIDVEN  --  2.4* 1.7    Estimated Creatinine Clearance: 68.8 mL/min (by C-G formula based on SCr of 0.61 mg/dL).    Allergies  Allergen Reactions  . Codeine Hives  . Morphine And Related Hives  . Reglan [Metoclopramide] Other (See Comments)    EPS symptoms     Antimicrobials this admission: Vancomycin 12/8>>   Microbiology results: No cx  Thank you for allowing pharmacy to be a part of this patient's care.  Isac Sarna, BS [place], [place] Clinical Pharmacist Pager [phone] 02/23/2019 8:52 PM

## 2019-02-23 NOTE — ED Provider Notes (Signed)
[facility] EMERGENCY DEPARTMENT Provider Note   CSN: [id number] Arrival date & time: 02/23/19  1020     History   Chief Complaint Chief Complaint  Patient presents with  . Wound Infection    HPI Angel Price is a 53 y.o. female.     Angel Price is a 53 y.o. female with an extensive history of chronic abdominal pain, chronic abdominal wall infections, chronic pancreatitis, GERD, gastroparesis, and multiple abdominal surgeries, who was sent from GI office for evaluation of abdominal pain, nausea and vomiting as well as new wound on the lower abdomen that is draining purulent fluid.  Patient was seen in the ED on 12/6 for abdominal pain and vomiting felt to be an exacerbation of her chronic abdominal pain.  She had reassuring labs and did not undergo imaging, states that since being home patient has not been able to control her vomiting or keep anything down despite using Phenergan suppositories and has had multiple episodes of vomiting and diarrhea, nonbloody.  She states that yesterday she noted a new wound that opened on her abdomen and has been draining purulent fluid with small amounts of blood.  She reports worsening abdominal pain.  No purulent discharge noted in her stool.  She has not noticed any fevers or chills but does report feeling some fatigue and malaise.  GI doctor sent her for CT scan and further evaluation and also expressed concern for dehydration.  Patient has had numerous abdominal surgeries previously and has been followed by Dr. Arnoldo Morale with general surgery as well.     Past Medical History:  Diagnosis Date  . Abscess    soft tissue  . Adrenal mass ([place])   . Alcohol abuse   . Anxiety   . Blood transfusion without reported diagnosis   . Chronic abdominal pain   . Chronic wound infection of abdomen   . Colon polyp    colonoscopy 04/2014  . Depression   . Diverticulosis    colonoscopy 04/2014 moderat pan colonic  . Drug-seeking behavior   . Gastritis     EGD 05/2014  . Gastroparesis Nov 2015  . GERD (gastroesophageal reflux disease)   . Hemorrhoid    internal large  . Hiatal hernia   . History of Billroth II operation   . Hypertension   . Lung nodule    CT 02/2014 needs repeat 1 month  . Lung nodule < 6cm on CT 04/25/2014  . Lupus ([place])   . Malingering   . Nausea and vomiting    chronic, recurrent  . Pancreatitis   . Schatzki's ring    patent per EGD 04/2014  . Sickle cell trait ([place])   . Suicide attempt ([place])   . 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 Abnormal CT scan, colon 12/09/2018  . Abnormal computed tomography of cecum and terminal ileum 12/09/2018  . Cocaine abuse ([place])   . Colitis 11/11/2018  . Hemorrhoidal skin tag   . SBO (small bowel obstruction) ([place])   . Non-intractable vomiting with nausea   . Folate deficiency 05/11/2017  . Abdominal wall fistula   . Cellulitis of abdominal wall 01/02/2017  . Acute left hemiparesis ([place]) 12/05/2016  . Malingering 12/05/2016  . Weakness of left lower extremity 12/02/2016  . CVA (cerebral vascular accident) ([place]) 11/23/2016  . Bright red rectal bleeding 08/22/2016  . Hypotension due to blood loss   . Acute  GI bleeding 05/24/2016  . Dieulafoy lesion of duodenum   . History of Billroth II operation   . Gastrointestinal hemorrhage 05/22/2016  . Absolute anemia   . Acute pancreatitis 10/01/2015  . Abnormal CT scan of lung 10/01/2015  . Alcohol intoxication ([place])   . Left-sided weakness   . Psychosomatic factor in physical condition   . Upper GI bleed   . Diverticulosis of colon with hemorrhage   . Chronic wound infection of abdomen 12/22/2014  . Thyroid disease 12/22/2014  . HTN (hypertension) 12/22/2014  . Ataxia 11/01/2014  . Hemorrhoids 04/26/2014  . Lung nodule 04/26/2014  . Diverticulosis   . Gastritis   . Hiatal hernia   . Schatzki's ring   . Acute blood loss anemia 04/25/2014  . Sinus  tachycardia 04/25/2014  . Hypokalemia 04/25/2014  . Hematemesis with nausea 04/25/2014  . Lung nodule < 6cm on CT 04/25/2014  . Intractable nausea and vomiting 04/24/2014  . Gastroparesis   . Abdominal pain 04/01/2014  . Chronic abdominal pain 01/21/2014  . Gastroenteritis 12/10/2013  . Chronic abdominal wound infection 08/16/2013  . MDD (major depressive disorder), recurrent episode, severe ([place]) 06/27/2013  . Wrist laceration 06/24/2013  . Diarrhea 05/07/2013  . Rectal bleeding 05/07/2013  . Abnormal LFTs 05/07/2013  . Adrenal mass, left ([place]) 05/07/2013  . Abdominal wall abscess at site of surgical wound 04/19/2013  . Abdominal wall abscess 04/18/2013  . Frequent headaches 03/30/2013  . Sleep difficulties 03/30/2013  . Essential hypertension, benign 03/30/2013  . History of cocaine abuse ([place]) 03/16/2013  . History of schizoaffective disorder 03/16/2013  . Bipolar disorder ([place]) 03/16/2013  . Personality disorder ([place]) 03/16/2013  . Tobacco abuse 03/16/2013  . Alcohol abuse 03/16/2013  . Palpitations 03/16/2013  . Poor vision 03/16/2013  . History of gastric bypass 03/16/2013  . Status post hysterectomy with oophorectomy 03/16/2013  . Hypothyroid 03/16/2013  . Lupus ([place]) 03/16/2013    Past Surgical History:  Procedure Laterality Date  . ABDOMINAL HYSTERECTOMY  2013   [place]  . ABDOMINAL SURGERY    . ADRENALECTOMY Right   . AGILE CAPSULE N/A 01/05/2015   Procedure: AGILE CAPSULE;  Surgeon: Daneil Dolin, MD;  Location[facility];  Service: Endoscopy;  Laterality: N/A;  0700  . Billroth II procedure      [place], first 2000, 2005/2006.  Marland Kitchen BIOPSY  05/20/2013   Procedure: BIOPSIES OF ASCENDING AND SIGMOID COLON;  Surgeon: Daneil Dolin, MD;  Location: [facility] ORS;  Service: Endoscopy;;  . BIOPSY  04/26/2014   Procedure: BIOPSIES;  Surgeon: Danie Binder, MD;  Location: [facility];  Service: Endoscopy;;  . BIOPSY  12/24/2018   Procedure: BIOPSY;  Surgeon: Daneil Dolin,  MD;  Location: [facility];  Service: Endoscopy;;  colon  . CHOLECYSTECTOMY    . COLONOSCOPY     in [place]  . COLONOSCOPY WITH PROPOFOL N/A 05/20/2013   Dr.Rourk- inadequate prep, normal appearing rectum, grossly normal colon aside from pancolonic diverticula, normal terminal ileum bx= unremarkable colonic mucosa. Due for early interval 2016.   Marland Kitchen COLONOSCOPY WITH PROPOFOL N/A 04/26/2014   [id number] ileum/one colon polyp removed/moderate pan-colonic diverticulosis/large internal hemorrhoids  . COLONOSCOPY WITH PROPOFOL N/A 12/23/2014   Dr.Rourk- minimal internal hemorrhoids, pancolonic diverticulosis  . COLONOSCOPY WITH PROPOFOL N/A 08/23/2016   Procedure: COLONOSCOPY WITH PROPOFOL;  Surgeon: Danie Binder, MD;  [facility];  Service: Endoscopy;  Laterality: N/A;  . COLONOSCOPY WITH PROPOFOL N/A 12/24/2018   Pancolonic diverticulosis,  normal TI, one 5 mm polyp in rectum (tubular adenoma). Somewhat friable hemorrhagic mucosa in area of IC valve/cecum, possibly related to scope trauma s/p biopsy.   . DEBRIDEMENT OF ABDOMINAL WALL ABSCESS N/A 02/08/2013   Procedure: DEBRIDEMENT OF ABDOMINAL WALL ABSCESS;  Surgeon: Jamesetta So, MD;  Location: [facility];  Service: General;  Laterality: N/A;  . ESOPHAGOGASTRODUODENOSCOPY (EGD) WITH PROPOFOL N/A 05/20/2013   Dr.Rourk- s/p prior gastric surgery with normal esophagus, residual gastric mucosa and patent efferent limb  . ESOPHAGOGASTRODUODENOSCOPY (EGD) WITH PROPOFOL N/A 02/03/2014   Dr. Gala Romney:  s/p hemigastrectomy with retained gastric contents. Residual gastric mucosa and efferent limb appeared normal otherwise. Query gastroparesis.   Marland Kitchen ESOPHAGOGASTRODUODENOSCOPY (EGD) WITH PROPOFOL N/A 04/26/2014   [id number] ring/small HH/mild non-erosive gasrtitis/normal anastomosis  . ESOPHAGOGASTRODUODENOSCOPY (EGD) WITH PROPOFOL N/A 12/23/2014   Dr.Rourk- s/p prior hemigastrctomy, active oozing from anastomotic suture site, hemostasis achieved  .  ESOPHAGOGASTRODUODENOSCOPY (EGD) WITH PROPOFOL N/A 05/23/2016   Dr. 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 ESOPHAGOGASTRODUODENOSCOPY (EGD) WITH PROPOFOL N/A 05/11/2017   Procedure: ESOPHAGOGASTRODUODENOSCOPY (EGD) WITH PROPOFOL;  Surgeon: Daneil Dolin, MD;  [facility];  Service: Endoscopy;  Laterality: N/A;  . HEMORRHOID SURGERY N/A 06/18/2017   Procedure: THREE COLUMN EXTENSIVE HEMORRHOIDECTOMY;  Surgeon: Virl Cagey, MD;  Location: [facility];  Service: General;  Laterality: N/A;  . INCISION AND DRAINAGE ABSCESS N/A 01/06/2017   Procedure: INCISION AND DRAINAGE ABDOMINAL WALL ABSCESS;  Surgeon: Aviva Signs, MD;  Location: [facility];  Service: General;  Laterality: N/A;  . POLYPECTOMY  12/24/2018   Procedure: POLYPECTOMY;  Surgeon: Daneil Dolin, MD;  [facility];  Service: Endoscopy;;  colon  . tendon repar Right    wrist  . WOUND EXPLORATION Right 06/24/2013   Procedure: exploration of traumatic wound right wrist;  Surgeon: Tennis Must, MD;  Location: [place];  Service: Orthopedics;  Laterality: Right;     OB History    Gravida  4   Para  4   Term  4   Preterm      AB      Living  3     SAB      TAB      Ectopic      Multiple      Live Births               Home Medications    Prior to Admission medications   Medication Sig Start Date End Date Taking? Authorizing Provider  amLODipine (NORVASC) 10 MG tablet Take 0.5 tablets (5 mg total) by mouth daily. Patient not taking: Reported on 02/23/2019 01/03/17   Patrecia Pour, Christean Grief, MD  atenolol (TENORMIN) 25 MG tablet Take 1 tablet (25 mg total) by mouth daily. Patient not taking: Reported on 02/23/2019 11/12/18   Roxan Hockey, MD  dicyclomine (BENTYL) 10 MG capsule Take 1 capsule (10 mg total) by mouth 4 (four) times daily -  before meals and at bedtime. For diarrhea and abdominal  cramps Patient not taking: Reported on 02/23/2019 12/09/18   Mahala Menghini, PA-C  escitalopram (LEXAPRO) 20 MG tablet Take 20 mg daily by mouth.    [provider]  folic acid (FOLVITE) 1 MG tablet Take 1 tablet (1 mg total) by mouth daily. Patient not taking: Reported on 02/23/2019 11/13/18   Roxan Hockey, MD  omeprazole (PRILOSEC) 20 MG capsule Take 1  capsule (20 mg total) by mouth daily for 14 days. Patient not taking: Reported on 02/23/2019 01/27/19 02/21/19  McDonald, Mia A, PA-C  ondansetron (ZOFRAN ODT) 4 MG disintegrating tablet Take 1 tablet (4 mg total) by mouth every 8 (eight) hours as needed for nausea. Patient not taking: Reported on 02/23/2019 11/12/18   Roxan Hockey, MD  promethazine (PHENERGAN) 25 MG suppository Place 1 suppository (25 mg total) rectally every 6 (six) hours as needed for nausea or vomiting. Patient not taking: Reported on 02/23/2019 01/27/19   McDonald, Maree Erie A, PA-C  sucralfate (CARAFATE) 1 g tablet Take 1 tablet (1 g total) by mouth 4 (four) times daily for 14 days. Patient not taking: Reported on 02/23/2019 01/27/19 02/21/19  McDonald, Maree Erie A, PA-C  thiamine 100 MG tablet Take 1 tablet (100 mg total) by mouth daily. Patient not taking: Reported on 02/23/2019 11/13/18   Roxan Hockey, MD    Family History Family History  Problem Relation Age of Onset  . Brain cancer Son   . Schizophrenia Son   . Cancer Son        brain  . Lung cancer Father   . Cancer Father        mets  . Drug abuse Mother   . Breast cancer Maternal Aunt   . Bipolar disorder Maternal Aunt   . Drug abuse Maternal Aunt   . Colon cancer Maternal Grandmother        late 77s, early 38s  . Drug abuse Sister   . Drug abuse Brother   . Bipolar disorder Paternal Grandfather   . Bipolar disorder Cousin   . Liver disease Neg Hx     Social History Social History   Tobacco Use  . Smoking status: Current Every Day Smoker    Packs/day: 0.50    Years: 35.00    Pack years: 17.50     Types: Cigarettes  . Smokeless tobacco: Never Used  Substance Use Topics  . Alcohol use: Not Currently    Alcohol/week: 0.0 standard drinks    Comment: 2-3 cans every other day; denied 12/09/18  . Drug use: No    Types: Cocaine    Comment: last used 4 days ago on 11/06/18; denied 12/09/18     Allergies   Codeine, Morphine and related, and Reglan [metoclopramide]   Review of Systems Review of Systems  Constitutional: Positive for fatigue. Negative for chills and fever.  Respiratory: Negative for cough and shortness of breath.   Cardiovascular: Negative for chest pain.  Gastrointestinal: Positive for abdominal pain, diarrhea, nausea and vomiting. Negative for blood in stool.  Genitourinary: Negative for dysuria and frequency.  Musculoskeletal: Negative for arthralgias and myalgias.  Skin: Negative for color change and rash.  Neurological: Negative for syncope and light-headedness.  All other systems reviewed and are negative.    Physical Exam Updated Vital Signs BP (!) 130/108 (BP Location: Right Arm) Comment: has not taken bp meds, cannot keep them down  Pulse 77   Temp 98.2 F (36.8 C) (Oral)   Resp 18   Ht 5\' 3"  (1.6 m)   Wt 59 kg   SpO2 100%   BMI 23.03 kg/m   Physical Exam Vitals signs and nursing note reviewed.  Constitutional:      General: She is not in acute distress.    Appearance: Normal appearance. She is well-developed and normal weight. She is not diaphoretic.     Comments: Patient appears uncomfortable but is in no acute distress.  HENT:  Head: Normocephalic and atraumatic.     Mouth/Throat:     Comments: Mucous membranes slightly dry. Eyes:     General:        Right eye: No discharge.        Left eye: No discharge.     Pupils: Pupils are equal, round, and reactive to light.  Neck:     Musculoskeletal: Neck supple.  Cardiovascular:     Rate and Rhythm: Normal rate and regular rhythm.     Heart sounds: Normal heart sounds. No murmur. No  friction rub. No gallop.   Pulmonary:     Effort: Pulmonary effort is normal. No respiratory distress.     Breath sounds: Normal breath sounds. No wheezing or rales.     Comments: Respirations equal and unlabored, patient able to speak in full sentences, lungs clear to auscultation bilaterally Abdominal:     General: Bowel sounds are normal. There is no distension.     Palpations: Abdomen is soft. There is no mass.     Tenderness: There is abdominal tenderness. There is no guarding.     Comments: Abdomen is diffusely tender to palpation, bowel sounds are present, multiple healed surgical scars noted as well as multiple open chronic appearing wounds.  Just below the navel patient has an open area that is erythematous with purulent drainage as pictured below, it is unclear how deep this wound tracks..  Musculoskeletal:        General: No deformity.  Skin:    General: Skin is warm and dry.     Capillary Refill: Capillary refill takes less than 2 seconds.  Neurological:     Mental Status: She is alert and oriented to person, place, and time.     Coordination: Coordination normal.     Comments: Speech is clear, able to follow commands Moves extremities without ataxia, coordination intact  Psychiatric:        Mood and Affect: Mood normal.        Behavior: Behavior normal.        ED Treatments / Results  Labs (all labs ordered are listed, but only abnormal results are displayed) Labs Reviewed  LACTIC ACID, PLASMA - Abnormal; Notable for the following components:      Result Value   Lactic Acid, Venous 2.4 (*)    All other components within normal limits  COMPREHENSIVE METABOLIC PANEL - Abnormal; Notable for the following components:   CO2 21 (*)    Total Protein 8.2 (*)    AST 155 (*)    ALT 120 (*)    Alkaline Phosphatase 153 (*)    Total Bilirubin 1.4 (*)    All other components within normal limits  CBC WITH DIFFERENTIAL/PLATELET - Abnormal; Notable for the following  components:   WBC 3.1 (*)    Neutro Abs 1.0 (*)    All other components within normal limits  LACTIC ACID, PLASMA  URINALYSIS, ROUTINE W REFLEX MICROSCOPIC    EKG None  Radiology No results found.  Procedures Procedures (including critical care time)  Medications Ordered in ED Medications  sodium chloride 0.9 % bolus 1,000 mL (has no administration in time range)  iohexol (OMNIPAQUE) 300 MG/ML solution 100 mL (has no administration in time range)     Initial Impression / Assessment and Plan / ED Course  I have reviewed the triage vital signs and the nursing notes.  Pertinent labs & imaging results that were available during my care of the patient were reviewed by  me and considered in my medical decision making (see chart for details).  53 year old female sent from GI office for evaluation of draining wound from the abdomen, history of chronic abdominal wall infections and fistulas as well as persistent abdominal pain and vomiting.  She was seen in the ED on 12/6 and was treated symptomatically for exacerbation of her chronic abdominal pain, since discharge she has had persistent vomiting and diarrhea and developed new open area on the lower abdomen just below the navel that is draining purulent fluid.  GI office also expressed concern for dehydration.  On arrival patient is hemodynamically stable appears uncomfortable but is not in acute distress.  Abdominal labs and lactic acid ordered from triage, CT abdomen pelvis ordered as well.  Patient's lab work shows chronic but stable leukopenia, stable hemoglobin, no significant electrolyte derangements and normal renal function.  LFTs and T bili are mildly elevated but they have been elevated to similar levels in the past.  Normal lipase.  Initial lactic acid of 2.4, improved to 1.7 with fluids, patient has not been febrile or tachycardic and has not exhibited any hypotension.  Pain treated with IV fluids, Dilaudid and Zofran, but patient  complains of persistent nausea.  Given IV Phenergan as well.  CT scan appears stable when compared to August of this year, but patient still has drainage from the opening on the abdomen and despite multiple antiemetics reports persistent nausea, EKG shows mild QT prolongation of 468.  Will discuss with general surgery.  Patient's GI practice is not on-call this evening but suspect the patient will require admission and they can be consulted in the morning.  Case discussed with Dr. Arnoldo Morale with general surgery who knows patient case well.  Is reassured by CT scan that there is not a new fistula present but given new area of drainage recommend starting patient on IV vancomycin and admitted him to medicine service for observation and treatment of persistent nausea and vomiting.  He will see the patient in the hospital tomorrow morning.  Case discussed with Dr. Scherrie November with [facility] hospitalist who will see and admit the patient.  Final Clinical Impressions(s) / ED Diagnoses   Final diagnoses:  Open abdominal wall wound, initial encounter  Intractable vomiting with nausea, unspecified vomiting type  QT prolongation    ED Discharge Orders    None       Janet Berlin 02/23/19 2030    Milton Ferguson, MD 02/26/19 [id number]

## 2019-02-23 NOTE — H&P (Signed)
History and Physical    DANIYA HERMANCE [id number] DOB: 05/19/1965 DOA: 02/23/2019  PCP: Rosita Fire, MD  Patient coming from: Home  I have personally briefly reviewed patient's old medical records in [place]  Chief Complaint: Abdominal pain, nausea, vomiting  HPI: Angel Price is a 53 y.o. 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  Patient did have a 5-day history of increased abdominal pain, nausea, vomiting, diarrhea.  She states her pain is mainly located on the surgical incision site with multiple open areas.  She has had 2 chronic abdominal wall wounds that always drain and now she has developed a new open area below the umbilicus that was draining pus and blood.  She was seen by GI as outpatient today on 02/23/2019 and sent him to the ER for admission. Patient was seen in the ER on 02/21/2019 with similar symptoms and was given symptomatic treatment at the time and discharged. ED Course:  Vital Signs reviewed on presentation, significant for temperature 98.2, heart rate 81, blood pressure 113/79, saturation 97% on room air. Labs reviewed, significant for sodium 136, potassium 4.1, chloride 101, BUN 8, creatinine 0.6, AST 155, ALT 120, total bilirubin 1.4, lactic acid 2.4 with repeat of 1.7.  WBC count 3.1, hemoglobin 13.9, hematocrit 39, platelets 316, SARS-CoV-2 RT-PCR is pending. Imaging personally Reviewed, CT of the abdomen pelvis with contrast shows no substantial change since recent study in August.  Wall thickening associated with small bowel anastomosis on the right.  No evidence of small bowel obstruction.  Stable appearance of soft tissue density in the ventral abdominal  wall extending to the skin surface.  No new collections noted. EKG personally reviewed, shows sinus rhythm, no acute ST-T changes.  Review of Systems: As per HPI otherwise 10 point review of systems negative.  All other review of systems is negative except the ones noted above in the HPI.  Past Medical History:  Diagnosis Date  . Abscess    soft tissue  . Adrenal mass ([place])   . Alcohol abuse   . Anxiety   . Blood transfusion without reported diagnosis   . Chronic abdominal pain   . Chronic wound infection of abdomen   . Colon polyp    colonoscopy 04/2014  . Depression   . Diverticulosis    colonoscopy 04/2014 moderat pan colonic  . Drug-seeking behavior   . Gastritis    EGD 05/2014  . Gastroparesis Nov 2015  . GERD (gastroesophageal reflux disease)   . Hemorrhoid    internal large  . Hiatal hernia   . History of Billroth II operation   . Hypertension   . Lung nodule    CT 02/2014 needs repeat 1 month  . Lung nodule < 6cm on CT 04/25/2014  . Lupus ([place])   . Malingering   . Nausea and vomiting    chronic, recurrent  . Pancreatitis   . Schatzki's ring    patent per EGD 04/2014  . Sickle cell trait ([place])   . Suicide attempt ([place])   . 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 ABDOMINAL HYSTERECTOMY  2013   [place]  . ABDOMINAL SURGERY    . ADRENALECTOMY Right   .  AGILE CAPSULE N/A 01/05/2015   Procedure: AGILE CAPSULE;  Surgeon: Daneil Dolin, MD;  [facility];  Service: Endoscopy;  Laterality: N/A;  0700  . Billroth II procedure      [place], first 2000, 2005/2006.  Marland Kitchen BIOPSY  05/20/2013   Procedure: BIOPSIES OF ASCENDING AND SIGMOID COLON;  Surgeon: Daneil Dolin, MD;  Location: [facility];  Service: Endoscopy;;  . BIOPSY  04/26/2014   Procedure: BIOPSIES;  Surgeon: Danie Binder, MD;  Location: [facility];  Service: Endoscopy;;  . BIOPSY  12/24/2018   Procedure: BIOPSY;  Surgeon: Daneil Dolin, MD;   [facility];  Service: Endoscopy;;  colon  . CHOLECYSTECTOMY    . COLONOSCOPY     in [place]  . COLONOSCOPY WITH PROPOFOL N/A 05/20/2013   Dr.Rourk- inadequate prep, normal appearing rectum, grossly normal colon aside from pancolonic diverticula, normal terminal ileum bx= unremarkable colonic mucosa. Due for early interval 2016.   Marland Kitchen COLONOSCOPY WITH PROPOFOL N/A 04/26/2014   [id number] ileum/one colon polyp removed/moderate pan-colonic diverticulosis/large internal hemorrhoids  . COLONOSCOPY WITH PROPOFOL N/A 12/23/2014   Dr.Rourk- minimal internal hemorrhoids, pancolonic diverticulosis  . COLONOSCOPY WITH PROPOFOL N/A 08/23/2016   Procedure: COLONOSCOPY WITH PROPOFOL;  Surgeon: Danie Binder, MD;  [facility];  Service: Endoscopy;  Laterality: N/A;  . COLONOSCOPY WITH PROPOFOL N/A 12/24/2018   Pancolonic diverticulosis, normal TI, one 5 mm polyp in rectum (tubular adenoma). Somewhat friable hemorrhagic mucosa in area of IC valve/cecum, possibly related to scope trauma s/p biopsy.   . DEBRIDEMENT OF ABDOMINAL WALL ABSCESS N/A 02/08/2013   Procedure: DEBRIDEMENT OF ABDOMINAL WALL ABSCESS;  Surgeon: Jamesetta So, MD;  Location: [facility];  Service: General;  Laterality: N/A;  . ESOPHAGOGASTRODUODENOSCOPY (EGD) WITH PROPOFOL N/A 05/20/2013   Dr.Rourk- s/p prior gastric surgery with normal esophagus, residual gastric mucosa and patent efferent limb  . ESOPHAGOGASTRODUODENOSCOPY (EGD) WITH PROPOFOL N/A 02/03/2014   Dr. Gala Romney:  s/p hemigastrectomy with retained gastric contents. Residual gastric mucosa and efferent limb appeared normal otherwise. Query gastroparesis.   Marland Kitchen ESOPHAGOGASTRODUODENOSCOPY (EGD) WITH PROPOFOL N/A 04/26/2014   [id number] ring/small HH/mild non-erosive gasrtitis/normal anastomosis  . ESOPHAGOGASTRODUODENOSCOPY (EGD) WITH PROPOFOL N/A 12/23/2014   Dr.Rourk- s/p prior hemigastrctomy, active oozing from anastomotic suture site, hemostasis achieved  .  ESOPHAGOGASTRODUODENOSCOPY (EGD) WITH PROPOFOL N/A 05/23/2016   Dr. 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 ESOPHAGOGASTRODUODENOSCOPY (EGD) WITH PROPOFOL N/A 05/11/2017   Procedure: ESOPHAGOGASTRODUODENOSCOPY (EGD) WITH PROPOFOL;  Surgeon: Daneil Dolin, MD;  [facility];  Service: Endoscopy;  Laterality: N/A;  . HEMORRHOID SURGERY N/A 06/18/2017   Procedure: THREE COLUMN EXTENSIVE HEMORRHOIDECTOMY;  Surgeon: Virl Cagey, MD;  Location: [facility];  Service: General;  Laterality: N/A;  . INCISION AND DRAINAGE ABSCESS N/A 01/06/2017   Procedure: INCISION AND DRAINAGE ABDOMINAL WALL ABSCESS;  Surgeon: Aviva Signs, MD;  Location: [facility];  Service: General;  Laterality: N/A;  . POLYPECTOMY  12/24/2018   Procedure: POLYPECTOMY;  Surgeon: Daneil Dolin, MD;  [facility];  Service: Endoscopy;;  colon  . tendon repar Right    wrist  . WOUND EXPLORATION Right 06/24/2013   Procedure: exploration of traumatic wound right wrist;  Surgeon: Tennis Must, MD;  Location: [place];  Service: Orthopedics;  Laterality: Right;     reports that she has been smoking cigarettes. She has a 17.50 pack-year  smoking history. She has never used smokeless tobacco. She reports previous alcohol use. She reports that she does not use drugs.  Allergies  Allergen Reactions  . Codeine Hives  . Morphine And Related Hives  . Reglan [Metoclopramide] Other (See Comments)    EPS symptoms     Family History  Problem Relation Age of Onset  . Brain cancer Son   . Schizophrenia Son   . Cancer Son        brain  . Lung cancer Father   . Cancer Father        mets  . Drug abuse Mother   . Breast cancer Maternal Aunt   . Bipolar disorder Maternal Aunt   . Drug abuse Maternal Aunt   . Colon cancer Maternal Grandmother        late 88s, early 47s  . Drug abuse Sister   . Drug abuse Brother   . Bipolar  disorder Paternal Grandfather   . Bipolar disorder Cousin   . Liver disease Neg Hx    Family history reviewed, noted as above, not pertinent to current presentation.   Prior to Admission medications   Medication Sig Start Date End Date Taking? Authorizing Provider  amLODipine (NORVASC) 10 MG tablet Take 0.5 tablets (5 mg total) by mouth daily. 01/03/17  Yes Patrecia Pour, Christean Grief, MD  atenolol (TENORMIN) 50 MG tablet Take 50 mg by mouth daily. 02/01/19  Yes [provider]  dicyclomine (BENTYL) 10 MG capsule Take 1 capsule (10 mg total) by mouth 4 (four) times daily -  before meals and at bedtime. For diarrhea and abdominal cramps 12/09/18  Yes Mahala Menghini, PA-C  escitalopram (LEXAPRO) 20 MG tablet Take 20 mg daily by mouth.   Yes [provider]  folic acid (FOLVITE) 1 MG tablet Take 1 tablet (1 mg total) by mouth daily. 11/13/18  Yes Emokpae, Courage, MD  ibuprofen (ADVIL) 800 MG tablet Take 800 mg by mouth 2 (two) times daily as needed. 02/08/19  Yes [provider]  omeprazole (PRILOSEC) 20 MG capsule Take 1 capsule (20 mg total) by mouth daily for 14 days. 01/27/19 02/23/19 Yes McDonald, Mia A, PA-C  promethazine (PHENERGAN) 25 MG suppository Place 1 suppository (25 mg total) rectally every 6 (six) hours as needed for nausea or vomiting. 01/27/19  Yes McDonald, Mia A, PA-C  promethazine (PHENERGAN) 25 MG tablet Take 25 mg by mouth every 6 (six) hours as needed for nausea or vomiting.   Yes [provider]  sucralfate (CARAFATE) 1 g tablet Take 1 tablet (1 g total) by mouth 4 (four) times daily for 14 days. 01/27/19 02/23/19 Yes McDonald, Mia A, PA-C  thiamine 100 MG tablet Take 1 tablet (100 mg total) by mouth daily. 11/13/18  Yes Emokpae, Courage, MD  atenolol (TENORMIN) 25 MG tablet Take 1 tablet (25 mg total) by mouth daily. Patient not taking: Reported on 02/23/2019 11/12/18   Roxan Hockey, MD  ondansetron (ZOFRAN ODT) 4 MG disintegrating tablet Take 1  tablet (4 mg total) by mouth every 8 (eight) hours as needed for nausea. Patient not taking: Reported on 02/23/2019 11/12/18   Roxan Hockey, MD    Physical Exam: Vitals:   02/23/19 1901 02/23/19 1915 02/23/19 1930 02/23/19 1945  BP: 115/85  112/87   Pulse: (!) 105 92 91 87  Resp:    20  Temp:      TempSrc:      SpO2: 97% 98% 97% 96%  Weight:  Height:        Constitutional: NAD, calm, comfortable Vitals:   02/23/19 1901 02/23/19 1915 02/23/19 1930 02/23/19 1945  BP: 115/85  112/87   Pulse: (!) 105 92 91 87  Resp:    20  Temp:      TempSrc:      SpO2: 97% 98% 97% 96%  Weight:      Height:       Eyes: PERRL, lids and conjunctivae normal ENMT: Mucous membranes are moist. Posterior pharynx clear of any exudate or lesions.Normal dentition.  Neck: normal, supple, no masses, no thyromegaly Respiratory: clear to auscultation bilaterally, no wheezing, no crackles. Normal respiratory effort. No accessory muscle use.  Cardiovascular: Regular rate and rhythm, no murmurs / rubs / gallops. No extremity edema. 2+ pedal pulses. No carotid bruits.  Abdomen: Tender to palpation around the umbilicus. Chronic fistula openings above umbilicus to the right with milky drainage and below umbilicus to the left with serous drainage. New opening just below umbilicus to right of midline with pinpoint opening and purulent drainage.  Musculoskeletal: no clubbing / cyanosis. No joint deformity upper and lower extremities. Good ROM, no contractures. Normal muscle tone.  Skin: no rashes, lesions, ulcers. No induration Neurologic: CN 2-12 grossly intact. Sensation intact, DTR normal. Strength 5/5 in all 4.  Psychiatric: Normal judgment and insight. Alert and oriented x 3. Normal mood.    Decubitus Ulcers: Not present on admission Catheters and tubes: None   Labs on Admission: I have personally reviewed following labs and imaging studies  CBC: Recent Labs  Lab 02/21/19 1207 02/23/19 1212  WBC  3.1* 3.1*  NEUTROABS  --  1.0*  HGB 13.7 13.9  HCT 39.4 39.8  MCV 82.1 80.9  PLT 335 [id number]   Basic Metabolic Panel: Recent Labs  Lab 02/21/19 1207 02/23/19 1212  NA 141 136  K 4.1 4.1  CL 107 101  CO2 24 21*  GLUCOSE 126* 83  BUN 22* 8  CREATININE 0.66 0.61  CALCIUM 8.7* 8.9   GFR: Estimated Creatinine Clearance: 68.8 mL/min (by C-G formula based on SCr of 0.61 mg/dL). Liver Function Tests: Recent Labs  Lab 02/21/19 1207 02/23/19 1212  AST 56* 155*  ALT 58* 120*  ALKPHOS 111 153*  BILITOT 0.8 1.4*  PROT 8.3* 8.2*  ALBUMIN 4.4 4.2   Recent Labs  Lab 02/21/19 1207 02/23/19 1212  LIPASE 19 16   No results for input(s): AMMONIA in the last 168 hours. Coagulation Profile: No results for input(s): INR, PROTIME in the last 168 hours. Cardiac Enzymes: No results for input(s): CKTOTAL, CKMB, CKMBINDEX, TROPONINI in the last 168 hours. BNP (last 3 results) No results for input(s): PROBNP in the last 8760 hours. HbA1C: No results for input(s): HGBA1C in the last 72 hours. CBG: No results for input(s): GLUCAP in the last 168 hours. Lipid Profile: No results for input(s): CHOL, HDL, LDLCALC, TRIG, CHOLHDL, LDLDIRECT in the last 72 hours. Thyroid Function Tests: No results for input(s): TSH, T4TOTAL, FREET4, T3FREE, THYROIDAB in the last 72 hours. Anemia Panel: No results for input(s): VITAMINB12, FOLATE, FERRITIN, TIBC, IRON, RETICCTPCT in the last 72 hours. Urine analysis:    Component Value Date/Time   COLORURINE YELLOW 02/21/2019 [place] 02/21/2019 1158   LABSPEC 1.016 02/21/2019 1158   PHURINE 5.0 02/21/2019 1158   GLUCOSEU NEGATIVE 02/21/2019 1158   HGBUR NEGATIVE 02/21/2019 1158   BILIRUBINUR NEGATIVE 02/21/2019 1158   KETONESUR NEGATIVE 02/21/2019 1158   PROTEINUR NEGATIVE 02/21/2019  1158   UROBILINOGEN 0.2 11/26/2014 0915   NITRITE NEGATIVE 02/21/2019 1158   LEUKOCYTESUR NEGATIVE 02/21/2019 1158    Radiological Exams on  Admission: Ct Abdomen Pelvis W Contrast  Result Date: 02/23/2019 CLINICAL DATA:  Nausea vomiting, abdominal wall fistulas EXAM: CT ABDOMEN AND PELVIS WITH CONTRAST TECHNIQUE: Multidetector CT imaging of the abdomen and pelvis was performed using the standard protocol following bolus administration of intravenous contrast. CONTRAST:  151mL OMNIPAQUE IOHEXOL 300 MG/ML  SOLN COMPARISON:  Multiple prior studies FINDINGS: Lower chest: No acute abnormality. Hepatobiliary: Fatty infiltration. Post cholecystectomy. No biliary dilatation. Pancreas: Unremarkable. Spleen: Unremarkable. Adrenals/Urinary Tract: Stable left adrenal mass compatible with an adenoma. Right adrenalectomy. Kidneys are unremarkable. Bladder is unremarkable. Stomach/Bowel: There are postsurgical changes of gastrojejunostomy. Small bowel anastomoses are present. There is wall thickening associated with the anastomosis in the right abdomen also present on prior. There is no evidence of bowel obstruction. Small bowel loops are apposed to the ventral abdominal wall likely reflecting adhesions. Vascular/Lymphatic: Aortic atherosclerosis. No enlarged abdominal or pelvic lymph nodes. Reproductive: Status post hysterectomy. No adnexal masses. Other: Soft tissue density in the ventral abdominal wall is similar in appearance extending to the skin surface, which may reflect reported fistulas. Musculoskeletal: No new significant osseous findings. IMPRESSION: No substantial change since most recent study of 11/11/2018. Wall thickening associated with small bowel anastomosis on the right. However, there is no evidence bowel obstruction. Stable appearance of soft tissue density in the ventral abdominal wall extending to the skin surface. No new collection. Electronically Signed   By: Macy Mis M.D.   On: 02/23/2019 16:34      Assessment/Plan Active Problems:   History of cocaine abuse ([facility])   History of schizoaffective disorder   Bipolar disorder  ([facility])   Tobacco abuse   Alcohol abuse   History of gastric bypass   Hypothyroid   Lupus ([facility])   Essential hypertension, benign   Chronic abdominal pain   Gastroparesis   Chronic wound infection of abdomen     Principal Problem: Abdominal wall wound infection Patient has a history of a gastrectomy with Roux-en-Y anastomosis, multiple abdominal surgeries, recurrent abdominal wall wound infections.  Has history of 2 chronic draining abdominal wall wounds.  Developed a new infraumbilical open area with drainage, abdominal pain, increased redness around the abdominal wall.  Appears to have new abdominal wall wound infection with mild surrounding cellulitis.  No fever or leukocytosis. Patient was seen by GI as outpatient and sent into the ER for admission due to inability to tolerate p.o. antibiotics and dehydration. Plan: We will place on IV vancomycin We will send wound culture from abdominal wall wound IV fluid resuscitation Follow-up with GI as outpatient Also sees Dr. Arnoldo Morale from surgical service, may need inpatient evaluation versus outpatient referral.  Other Active Problems: Intractable nausea and vomiting: Possibly secondary to gastroparesis We will place on antiemetics, PPI Clear liquid diet, advance as tolerated  Hypertension: Continue amlodipine, atenolol as tolerated  Peptic ulcer disease: We will place on IV PPI, continue sucralfate  Bipolar disorder/depression: Continue Lexapro   DVT prophylaxis: Lovenox Code Status:  Full code Family Communication: N/A  Disposition Plan: Placed in observation due to intractable nausea, vomiting.  Can transition to p.o. antibiotics if able to tolerate p.o.  May need surgical evaluation in a.m. Consults called: N/A Admission status: Observation status   Lynetta Mare MD [facility]  If 7PM-7AM, please contact night-coverage   02/23/2019, 8:03 PM

## 2019-02-23 NOTE — Progress Notes (Signed)
cc'ed to pcp °

## 2019-02-23 NOTE — ED Notes (Signed)
Per Angel Price, pt has n/v, unable to take her medications.  Has history of chronic abd pain, pancreatitis,  and abd wall fistulas.  Reports new opening that is draining.  Provider concerned for abscess and dehydration.

## 2019-02-23 NOTE — Patient Instructions (Signed)
I feel it is best you go to the emergency room, as this can't be managed as outpatient, specifically while unable to keep any liquids or medications down.  I am recommending a repeat CT in light of the new wound and drainage.   Further recommendations after ED evaluation! I hope you start feeling better.  Annitta Needs, PhD, ANP-BC [facility]

## 2019-02-23 NOTE — ED Triage Notes (Signed)
N/V/D since Friday.  Pt had abd surgery one year ago, the incision site is open and draining pus.  Went to pcp today and was sent to ED for CT scan and fluids.

## 2019-02-23 NOTE — ED Notes (Signed)
Date and time results received: 02/23/19 12:43 PM  (use smartphrase ".now" to insert current time)  Test: lactic Critical Value: 2.4  Name of Provider Notified: Dr Lacinda Axon   Orders Received? Or Actions Taken?:

## 2019-02-23 NOTE — Progress Notes (Signed)
Pharmacy Brief Note:  Change vancomycin dose to 1g IV q24h DC vancomycin 1.5g IV q24h.  Thank You, Despina Pole, Pharm. D. Clinical Pharmacist 02/23/2019 11:12 PM

## 2019-02-23 NOTE — ED Notes (Signed)
Pt with drainage noted x 2 to old incisions to abdomen.

## 2019-02-23 NOTE — ED Notes (Signed)
Attempted x 2 for IV access without success, CN [place] will assess

## 2019-02-24 DIAGNOSIS — G894 Chronic pain syndrome: Secondary | ICD-10-CM | POA: Diagnosis present

## 2019-02-24 DIAGNOSIS — F419 Anxiety disorder, unspecified: Secondary | ICD-10-CM | POA: Diagnosis present

## 2019-02-24 DIAGNOSIS — Z9884 Bariatric surgery status: Secondary | ICD-10-CM | POA: Diagnosis not present

## 2019-02-24 DIAGNOSIS — K219 Gastro-esophageal reflux disease without esophagitis: Secondary | ICD-10-CM | POA: Diagnosis present

## 2019-02-24 DIAGNOSIS — S31109D Unspecified open wound of abdominal wall, unspecified quadrant without penetration into peritoneal cavity, subsequent encounter: Secondary | ICD-10-CM | POA: Diagnosis not present

## 2019-02-24 DIAGNOSIS — K279 Peptic ulcer, site unspecified, unspecified as acute or chronic, without hemorrhage or perforation: Secondary | ICD-10-CM | POA: Diagnosis present

## 2019-02-24 DIAGNOSIS — Z888 Allergy status to other drugs, medicaments and biological substances status: Secondary | ICD-10-CM | POA: Diagnosis not present

## 2019-02-24 DIAGNOSIS — Z818 Family history of other mental and behavioral disorders: Secondary | ICD-10-CM | POA: Diagnosis not present

## 2019-02-24 DIAGNOSIS — Z885 Allergy status to narcotic agent status: Secondary | ICD-10-CM | POA: Diagnosis not present

## 2019-02-24 DIAGNOSIS — R9431 Abnormal electrocardiogram [ECG] [EKG]: Secondary | ICD-10-CM | POA: Diagnosis present

## 2019-02-24 DIAGNOSIS — K861 Other chronic pancreatitis: Secondary | ICD-10-CM | POA: Diagnosis present

## 2019-02-24 DIAGNOSIS — Z86010 Personal history of colonic polyps: Secondary | ICD-10-CM | POA: Diagnosis not present

## 2019-02-24 DIAGNOSIS — L03311 Cellulitis of abdominal wall: Secondary | ICD-10-CM | POA: Diagnosis present

## 2019-02-24 DIAGNOSIS — E039 Hypothyroidism, unspecified: Secondary | ICD-10-CM | POA: Diagnosis present

## 2019-02-24 DIAGNOSIS — Z903 Acquired absence of stomach [part of]: Secondary | ICD-10-CM | POA: Diagnosis not present

## 2019-02-24 DIAGNOSIS — F1410 Cocaine abuse, uncomplicated: Secondary | ICD-10-CM | POA: Diagnosis present

## 2019-02-24 DIAGNOSIS — D573 Sickle-cell trait: Secondary | ICD-10-CM | POA: Diagnosis present

## 2019-02-24 DIAGNOSIS — F1010 Alcohol abuse, uncomplicated: Secondary | ICD-10-CM | POA: Diagnosis present

## 2019-02-24 DIAGNOSIS — F259 Schizoaffective disorder, unspecified: Secondary | ICD-10-CM | POA: Diagnosis present

## 2019-02-24 DIAGNOSIS — L089 Local infection of the skin and subcutaneous tissue, unspecified: Secondary | ICD-10-CM | POA: Diagnosis not present

## 2019-02-24 DIAGNOSIS — I10 Essential (primary) hypertension: Secondary | ICD-10-CM | POA: Diagnosis present

## 2019-02-24 DIAGNOSIS — F319 Bipolar disorder, unspecified: Secondary | ICD-10-CM | POA: Diagnosis present

## 2019-02-24 DIAGNOSIS — F17210 Nicotine dependence, cigarettes, uncomplicated: Secondary | ICD-10-CM | POA: Diagnosis present

## 2019-02-24 DIAGNOSIS — L02211 Cutaneous abscess of abdominal wall: Secondary | ICD-10-CM | POA: Diagnosis present

## 2019-02-24 DIAGNOSIS — Z20828 Contact with and (suspected) exposure to other viral communicable diseases: Secondary | ICD-10-CM | POA: Diagnosis present

## 2019-02-24 DIAGNOSIS — K3184 Gastroparesis: Secondary | ICD-10-CM | POA: Diagnosis present

## 2019-02-24 DIAGNOSIS — Z79899 Other long term (current) drug therapy: Secondary | ICD-10-CM | POA: Diagnosis not present

## 2019-02-24 LAB — BASIC METABOLIC PANEL
Anion gap: 10 (ref 5–15)
BUN: 5 mg/dL — ABNORMAL LOW (ref 6–20)
CO2: 20 mmol/L — ABNORMAL LOW (ref 22–32)
Calcium: 8.1 mg/dL — ABNORMAL LOW (ref 8.9–10.3)
Chloride: 109 mmol/L (ref 98–111)
Creatinine, Ser: 0.61 mg/dL (ref 0.44–1.00)
GFR calc Af Amer: >60 mL/min (ref >60)
GFR calc non Af Amer: >60 mL/min (ref >60)
Glucose, Bld: 107 mg/dL — ABNORMAL HIGH (ref 70–99)
Potassium: 3.5 mmol/L (ref 3.5–5.1)
Sodium: 139 mmol/L (ref 135–145)

## 2019-02-24 LAB — CBC
HCT: 30.6 % — ABNORMAL LOW (ref 36.0–46.0)
Hemoglobin: 10.4 g/dL — ABNORMAL LOW (ref 12.0–15.0)
MCH: 28.3 pg (ref 26.0–34.0)
MCHC: 34 g/dL (ref 30.0–36.0)
MCV: 83.2 fL (ref 80.0–100.0)
Platelets: 245 10*3/uL (ref 150–400)
RBC: 3.68 MIL/uL — ABNORMAL LOW (ref 3.87–5.11)
RDW: 14.8 % (ref 11.5–15.5)
WBC: 3.9 10*3/uL — ABNORMAL LOW (ref 4.0–10.5)

## 2019-02-24 LAB — SARS CORONAVIRUS 2 (TAT 6-24 HRS): SARS Coronavirus 2: NEGATIVE

## 2019-02-24 MED ORDER — JUVEN PO PACK
1.0000 | PACK | Freq: Two times a day (BID) | ORAL | Status: DC
  Administered 2019-02-24 – 2019-02-26 (×5): 1 via ORAL
  Filled 2019-02-24 (×5): qty 1

## 2019-02-24 MED ORDER — ACETAMINOPHEN 500 MG PO TABS
1000.0000 mg | ORAL_TABLET | Freq: Three times a day (TID) | ORAL | Status: DC | PRN
  Administered 2019-02-24: 1000 mg via ORAL
  Filled 2019-02-24: qty 2

## 2019-02-24 MED ORDER — ENSURE ENLIVE PO LIQD
237.0000 mL | Freq: Two times a day (BID) | ORAL | Status: DC
  Administered 2019-02-24 – 2019-02-26 (×4): 237 mL via ORAL

## 2019-02-24 MED ORDER — HYDROMORPHONE HCL 1 MG/ML IJ SOLN
0.5000 mg | INTRAMUSCULAR | Status: DC | PRN
  Administered 2019-02-24 (×2): 0.5 mg via INTRAVENOUS
  Filled 2019-02-24 (×2): qty 0.5

## 2019-02-24 MED ORDER — CYCLOBENZAPRINE HCL 10 MG PO TABS
5.0000 mg | ORAL_TABLET | Freq: Three times a day (TID) | ORAL | Status: DC | PRN
  Administered 2019-02-24 – 2019-02-25 (×3): 5 mg via ORAL
  Filled 2019-02-24 (×3): qty 1

## 2019-02-24 MED ORDER — DIPHENHYDRAMINE HCL 25 MG PO CAPS
25.0000 mg | ORAL_CAPSULE | Freq: Three times a day (TID) | ORAL | Status: DC | PRN
  Administered 2019-02-24 – 2019-02-25 (×3): 25 mg via ORAL
  Filled 2019-02-24 (×3): qty 1

## 2019-02-24 MED ORDER — MUPIROCIN CALCIUM 2 % EX CREA
TOPICAL_CREAM | Freq: Two times a day (BID) | CUTANEOUS | Status: DC
  Administered 2019-02-24 – 2019-02-26 (×5): via TOPICAL
  Filled 2019-02-24: qty 15

## 2019-02-24 MED ORDER — PROMETHAZINE HCL 25 MG/ML IJ SOLN
12.5000 mg | Freq: Four times a day (QID) | INTRAMUSCULAR | Status: DC | PRN
  Administered 2019-02-24 – 2019-02-26 (×8): 12.5 mg via INTRAVENOUS
  Filled 2019-02-24 (×9): qty 1

## 2019-02-24 MED ORDER — ACETAMINOPHEN 650 MG RE SUPP: 650.0000 mg | Freq: Four times a day (QID) | RECTAL | Status: DC | PRN

## 2019-02-24 MED ORDER — OCUVITE-LUTEIN PO CAPS
1.0000 | ORAL_CAPSULE | Freq: Every day | ORAL | Status: DC
  Administered 2019-02-24 – 2019-02-26 (×3): 1 via ORAL
  Filled 2019-02-24 (×3): qty 1

## 2019-02-24 NOTE — Consult Note (Signed)
Reason for Consult: Abdominal wall cellulitis Referring Physician: Dr. Nicolasa Ducking is an 53 y.o. female.  HPI: Patient is a 53 year old black female with multiple medical problems, well-known to our service with a history of a chronic abdominal wall cellulitis and granulation.  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  Multiple surgeries and referrals to plastic surgery have been done for this in the past.  Patient states recently she developed a new fistulous track below the umbilicus.  She also presented to the emergency room after referral by GI for nausea and vomiting.  A CT scan of the abdomen was performed which revealed no abscess or enterocutaneous fistula.  It was not significantly changed from a previous CT scan done in August of this year.  Patient was admitted to the hospital for her nausea and vomiting.  Past Medical History:  Diagnosis Date  . Abscess    soft tissue  . Adrenal mass ([place])   . Alcohol abuse   . Anxiety   . Blood transfusion without reported diagnosis   . Chronic abdominal pain   . Chronic wound infection of abdomen   . Colon polyp    colonoscopy 04/2014  . Depression   . Diverticulosis    colonoscopy 04/2014 moderat pan colonic  . Drug-seeking behavior   . Gastritis    EGD 05/2014  . Gastroparesis Nov 2015  . GERD (gastroesophageal reflux disease)   . Hemorrhoid    internal large  . Hiatal hernia   . History of Billroth II operation   . Hypertension   . Lung nodule    CT 02/2014 needs repeat 1 month  . Lung nodule < 6cm on CT 04/25/2014  . Lupus ([place])   . Malingering   . Nausea and vomiting    chronic, recurrent  . Pancreatitis   . Schatzki's ring    patent per EGD 04/2014  . Sickle cell trait ([place])   . Suicide attempt ([place])   . 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 ABDOMINAL HYSTERECTOMY  2013   [place]  .  ABDOMINAL SURGERY    . ADRENALECTOMY Right   . AGILE CAPSULE N/A 01/05/2015   Procedure: AGILE CAPSULE;  Surgeon: Daneil Dolin, MD;  [facility];  Service: Endoscopy;  Laterality: N/A;  0700  . Billroth II procedure      [place], first 2000, 2005/2006.  Marland Kitchen BIOPSY  05/20/2013   Procedure: BIOPSIES OF ASCENDING AND SIGMOID COLON;  Surgeon: Daneil Dolin, MD;  Location: [facility];  Service: Endoscopy;;  . BIOPSY  04/26/2014   Procedure: BIOPSIES;  Surgeon: Danie Binder, MD;  Location: [facility];  Service: Endoscopy;;  . BIOPSY  12/24/2018   Procedure: BIOPSY;  Surgeon: Daneil Dolin, MD;  [facility];  Service: Endoscopy;;  colon  . CHOLECYSTECTOMY    . COLONOSCOPY     in [place]  . COLONOSCOPY WITH PROPOFOL N/A 05/20/2013   Dr.Rourk- inadequate prep, normal appearing rectum, grossly normal colon aside from pancolonic diverticula, normal terminal ileum bx= unremarkable colonic mucosa. Due for early interval 2016.   Marland Kitchen COLONOSCOPY WITH PROPOFOL N/A 04/26/2014   [id number] ileum/one colon polyp removed/moderate pan-colonic diverticulosis/large internal hemorrhoids  . COLONOSCOPY WITH PROPOFOL N/A 12/23/2014   Dr.Rourk- minimal internal hemorrhoids, pancolonic diverticulosis  . COLONOSCOPY WITH PROPOFOL N/A 08/23/2016   Procedure: COLONOSCOPY WITH  PROPOFOL;  Surgeon: Danie Binder, MD;  [facility];  Service: Endoscopy;  Laterality: N/A;  . COLONOSCOPY WITH PROPOFOL N/A 12/24/2018   Pancolonic diverticulosis, normal TI, one 5 mm polyp in rectum (tubular adenoma). Somewhat friable hemorrhagic mucosa in area of IC valve/cecum, possibly related to scope trauma s/p biopsy.   . DEBRIDEMENT OF ABDOMINAL WALL ABSCESS N/A 02/08/2013   Procedure: DEBRIDEMENT OF ABDOMINAL WALL ABSCESS;  Surgeon: Jamesetta So, MD;  Location: [facility];  Service: General;  Laterality: N/A;  . ESOPHAGOGASTRODUODENOSCOPY (EGD) WITH PROPOFOL N/A 05/20/2013   Dr.Rourk- s/p prior gastric surgery with  normal esophagus, residual gastric mucosa and patent efferent limb  . ESOPHAGOGASTRODUODENOSCOPY (EGD) WITH PROPOFOL N/A 02/03/2014   Dr. Gala Romney:  s/p hemigastrectomy with retained gastric contents. Residual gastric mucosa and efferent limb appeared normal otherwise. Query gastroparesis.   Marland Kitchen ESOPHAGOGASTRODUODENOSCOPY (EGD) WITH PROPOFOL N/A 04/26/2014   [id number] ring/small HH/mild non-erosive gasrtitis/normal anastomosis  . ESOPHAGOGASTRODUODENOSCOPY (EGD) WITH PROPOFOL N/A 12/23/2014   Dr.Rourk- s/p prior hemigastrctomy, active oozing from anastomotic suture site, hemostasis achieved  . ESOPHAGOGASTRODUODENOSCOPY (EGD) WITH PROPOFOL N/A 05/23/2016   Dr. 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 ESOPHAGOGASTRODUODENOSCOPY (EGD) WITH PROPOFOL N/A 05/11/2017   Procedure: ESOPHAGOGASTRODUODENOSCOPY (EGD) WITH PROPOFOL;  Surgeon: Daneil Dolin, MD;  [facility];  Service: Endoscopy;  Laterality: N/A;  . HEMORRHOID SURGERY N/A 06/18/2017   Procedure: THREE COLUMN EXTENSIVE HEMORRHOIDECTOMY;  Surgeon: Virl Cagey, MD;  [facility];  Service: General;  Laterality: N/A;  . INCISION AND DRAINAGE ABSCESS N/A 01/06/2017   Procedure: INCISION AND DRAINAGE ABDOMINAL WALL ABSCESS;  Surgeon: Aviva Signs, MD;  [facility];  Service: General;  Laterality: N/A;  . POLYPECTOMY  12/24/2018   Procedure: POLYPECTOMY;  Surgeon: Daneil Dolin, MD;  [facility];  Service: Endoscopy;;  colon  . tendon repar Right    wrist  . WOUND EXPLORATION Right 06/24/2013   Procedure: exploration of traumatic wound right wrist;  Surgeon: Tennis Must, MD;  Location: [facility];  Service: Orthopedics;  Laterality: Right;    Family History  Problem Relation Age of Onset  . Brain cancer Son   . Schizophrenia Son   . Cancer Son        brain  . Lung cancer Father   . Cancer Father        mets  .  Drug abuse Mother   . Breast cancer Maternal Aunt   . Bipolar disorder Maternal Aunt   . Drug abuse Maternal Aunt   . Colon cancer Maternal Grandmother        late 27s, early 62s  . Drug abuse Sister   . Drug abuse Brother   . Bipolar disorder Paternal Grandfather   . Bipolar disorder Cousin   . Liver disease Neg Hx     Social History:  reports that she has been smoking cigarettes. She has a 17.50 pack-year smoking history. She has never used smokeless tobacco. She reports previous alcohol use. She reports that she does not use drugs.  Allergies:  Allergies  Allergen Reactions  . Codeine Hives  . Morphine And Related Hives  . Reglan [Metoclopramide] Other (See Comments)    EPS symptoms     Medications:  Scheduled: . amLODipine  5 mg Oral Daily  . atenolol  50 mg Oral Daily  . enoxaparin (LOVENOX) injection  40 mg  Subcutaneous Q24H  . folic acid  1 mg Oral Daily  . pantoprazole (PROTONIX) IV  40 mg Intravenous Q24H  . scopolamine  1 patch Transdermal Q72H  . sodium chloride flush  3 mL Intravenous Q12H  . sucralfate  1 g Oral TID PC & HS  . thiamine  100 mg Oral Daily    Results for orders placed or performed during the hospital encounter of 02/23/19 (from the past 48 hour(s))  Lactic acid, plasma     Status: Abnormal   Collection Time: 02/23/19 12:12 PM  Result Value Ref Range   Lactic Acid, Venous 2.4 (HH) 0.5 - 1.9 mmol/L    Comment: CRITICAL RESULT CALLED TO, READ BACK BY AND VERIFIED WITH: STIENHOWARD,C AT 1243 ON 12.8.20 BY ISLEY,B Performed at [facility], [address], [place], [place] [address]   Comprehensive metabolic panel     Status: Abnormal   Collection Time: 02/23/19 12:12 PM  Result Value Ref Range   Sodium 136 135 - 145 mmol/L   Potassium 4.1 3.5 - 5.1 mmol/L   Chloride 101 98 - 111 mmol/L   CO2 21 (L) 22 - 32 mmol/L   Glucose, Bld 83 70 - 99 mg/dL   BUN 8 6 - 20 mg/dL   Creatinine, Ser 0.61 0.44 - 1.00 mg/dL   Calcium 8.9 8.9 - 10.3 mg/dL    Total Protein 8.2 (H) 6.5 - 8.1 g/dL   Albumin 4.2 3.5 - 5.0 g/dL   AST 155 (H) 15 - 41 U/L   ALT 120 (H) 0 - 44 U/L   Alkaline Phosphatase 153 (H) 38 - 126 U/L   Total Bilirubin 1.4 (H) 0.3 - 1.2 mg/dL   GFR calc non Af Amer >60 >60 mL/min   GFR calc Af Amer >60 >60 mL/min   Anion gap 14 5 - 15    Comment: Performed at [facility], [address], [place], [place] [address]  CBC with Differential     Status: Abnormal   Collection Time: 02/23/19 12:12 PM  Result Value Ref Range   WBC 3.1 (L) 4.0 - 10.5 K/uL   RBC 4.92 3.87 - 5.11 MIL/uL   Hemoglobin 13.9 12.0 - 15.0 g/dL   HCT 39.8 36.0 - 46.0 %   MCV 80.9 80.0 - 100.0 fL   MCH 28.3 26.0 - 34.0 pg   MCHC 34.9 30.0 - 36.0 g/dL   RDW 14.8 11.5 - 15.5 %   Platelets 316 150 - 400 K/uL   nRBC 0.0 0.0 - 0.2 %   Neutrophils Relative % 34 %   Neutro Abs 1.0 (L) 1.7 - 7.7 K/uL   Lymphocytes Relative 55 %   Lymphs Abs 1.7 0.7 - 4.0 K/uL   Monocytes Relative 9 %   Monocytes Absolute 0.3 0.1 - 1.0 K/uL   Eosinophils Relative 1 %   Eosinophils Absolute 0.0 0.0 - 0.5 K/uL   Basophils Relative 1 %   Basophils Absolute 0.0 0.0 - 0.1 K/uL   Immature Granulocytes 0 %   Abs Immature Granulocytes 0.00 0.00 - 0.07 K/uL    Comment: Performed at [facility], [address], [place], [place] [address]  Lipase, blood     Status: None   Collection Time: 02/23/19 12:12 PM  Result Value Ref Range   Lipase 16 11 - 51 U/L    Comment: Performed at [facility], [address] [place], [place] [address]  Lactic acid, plasma     Status: None   Collection  Time: 02/23/19  2:29 PM  Result Value Ref Range   Lactic Acid, Venous 1.7 0.5 - 1.9 mmol/L    Comment: Performed at [facility], [address], [place], [place] [address]  Urinalysis, Routine w reflex microscopic     Status: Abnormal   Collection Time: 02/23/19 11:00 PM  Result Value Ref Range   Color, Urine YELLOW YELLOW   APPearance CLEAR CLEAR   Specific Gravity, Urine 1.038 (H) 1.005 - 1.030    pH 5.0 5.0 - 8.0   Glucose, UA NEGATIVE NEGATIVE mg/dL   Hgb urine dipstick NEGATIVE NEGATIVE   Bilirubin Urine NEGATIVE NEGATIVE   Ketones, ur 5 (A) NEGATIVE mg/dL   Protein, ur NEGATIVE NEGATIVE mg/dL   Nitrite NEGATIVE NEGATIVE   Leukocytes,Ua NEGATIVE NEGATIVE    Comment: Performed at [facility], [address] [place], [place] [address]  Rapid urine drug screen (hospital performed)     Status: Abnormal   Collection Time: 02/23/19 11:00 PM  Result Value Ref Range   Opiates POSITIVE (A) NONE DETECTED   Cocaine NONE DETECTED NONE DETECTED   Benzodiazepines NONE DETECTED NONE DETECTED   Amphetamines NONE DETECTED NONE DETECTED   Tetrahydrocannabinol NONE DETECTED NONE DETECTED   Barbiturates NONE DETECTED NONE DETECTED    Comment: (NOTE) DRUG SCREEN FOR MEDICAL PURPOSES ONLY.  IF CONFIRMATION IS NEEDED FOR ANY PURPOSE, NOTIFY LAB WITHIN 5 DAYS. LOWEST DETECTABLE LIMITS FOR URINE DRUG SCREEN Drug Class                     Cutoff (ng/mL) Amphetamine and metabolites    1000 Barbiturate and metabolites    200 Benzodiazepine                 [id number] Tricyclics and metabolites     300 Opiates and metabolites        300 Cocaine and metabolites        300 THC                            50 Performed at [facility], [address], [place], [place] [id number]   Basic metabolic panel     Status: Abnormal   Collection Time: 02/24/19  4:51 AM  Result Value Ref Range   Sodium 139 135 - 145 mmol/L   Potassium 3.5 3.5 - 5.1 mmol/L   Chloride 109 98 - 111 mmol/L   CO2 20 (L) 22 - 32 mmol/L   Glucose, Bld 107 (H) 70 - 99 mg/dL   BUN 5 (L) 6 - 20 mg/dL   Creatinine, Ser 0.61 0.44 - 1.00 mg/dL   Calcium 8.1 (L) 8.9 - 10.3 mg/dL   GFR calc non Af Amer >60 >60 mL/min   GFR calc Af Amer >60 >60 mL/min   Anion gap 10 5 - 15    Comment: Performed at [facility], [address], [place], [place] [address]  CBC     Status: Abnormal   Collection Time: 02/24/19  4:51 AM  Result Value Ref Range    WBC 3.9 (L) 4.0 - 10.5 K/uL   RBC 3.68 (L) 3.87 - 5.11 MIL/uL   Hemoglobin 10.4 (L) 12.0 - 15.0 g/dL    Comment: DELTA CHECK NOTED   HCT 30.6 (L) 36.0 - 46.0 %   MCV 83.2 80.0 - 100.0 fL   MCH 28.3 26.0 - 34.0 pg   MCHC 34.0 30.0 - 36.0 g/dL   RDW  14.8 11.5 - 15.5 %   Platelets 245 150 - 400 K/uL    Comment: Performed at [facility], [address], [place], [place] [address]    Ct Abdomen Pelvis W Contrast  Result Date: 02/23/2019 CLINICAL DATA:  Nausea vomiting, abdominal wall fistulas EXAM: CT ABDOMEN AND PELVIS WITH CONTRAST TECHNIQUE: Multidetector CT imaging of the abdomen and pelvis was performed using the standard protocol following bolus administration of intravenous contrast. CONTRAST:  193mL OMNIPAQUE IOHEXOL 300 MG/ML  SOLN COMPARISON:  Multiple prior studies FINDINGS: Lower chest: No acute abnormality. Hepatobiliary: Fatty infiltration. Post cholecystectomy. No biliary dilatation. Pancreas: Unremarkable. Spleen: Unremarkable. Adrenals/Urinary Tract: Stable left adrenal mass compatible with an adenoma. Right adrenalectomy. Kidneys are unremarkable. Bladder is unremarkable. Stomach/Bowel: There are postsurgical changes of gastrojejunostomy. Small bowel anastomoses are present. There is wall thickening associated with the anastomosis in the right abdomen also present on prior. There is no evidence of bowel obstruction. Small bowel loops are apposed to the ventral abdominal wall likely reflecting adhesions. Vascular/Lymphatic: Aortic atherosclerosis. No enlarged abdominal or pelvic lymph nodes. Reproductive: Status post hysterectomy. No adnexal masses. Other: Soft tissue density in the ventral abdominal wall is similar in appearance extending to the skin surface, which may reflect reported fistulas. Musculoskeletal: No new significant osseous findings. IMPRESSION: No substantial change since most recent study of 11/11/2018. Wall thickening associated with small bowel anastomosis on the  right. However, there is no evidence bowel obstruction. Stable appearance of soft tissue density in the ventral abdominal wall extending to the skin surface. No new collection. Electronically Signed   By: Macy Mis M.D.   On: 02/23/2019 16:34    ROS:  Pertinent items are noted in HPI.  Blood pressure 107/75, pulse 69, temperature 98.2 F (36.8 C), temperature source Oral, resp. rate 16, height 5\' 3"  (1.6 m), weight 58.5 kg, SpO2 97 %. Physical Exam: Pleasant black female no acute distress Head is normocephalic, atraumatic Lungs clear to auscultation with good breath sounds bilaterally Heart examination reveals a regular rate and rhythm without S3, S4, murmurs Abdominal wall with multiple surgical scars present and thickened soft tissue along the midline surgical scar.  A small fistulous opening is noted inferiorly clear yellow fluid emanating from it.  I could not palpate a fluctuant area.  I could not express purulent drainage.  There is mild erythema in this region.  CT scan images personally reviewed  Assessment/Plan: Impression: Acute exacerbation of chronic abdominal wall inflammation.  Patient does not have a leukocytosis.  Patient is on vancomycin.  Once patient is able to switch to oral antibiotics, would suggest doxycycline or Bactrim.  Will write for topical Bactroban.  No need for surgical intervention.  Will follow peripherally with you.  Aviva Signs 02/24/2019, 9:37 AM

## 2019-02-24 NOTE — Telephone Encounter (Signed)
Patient has been seen in office yesterday.

## 2019-02-24 NOTE — Progress Notes (Addendum)
PROGRESS NOTE    Angel Price  [id number] DOB: [birth year] DOA: 02/23/2019 PCP: Rosita Fire, MD    Assessment & Plan:   Active Problems:   History of cocaine abuse ([place])   History of schizoaffective disorder   Bipolar disorder ([place])   Tobacco abuse   Alcohol abuse   History of gastric bypass   Hypothyroid   Lupus ([place])   Essential hypertension, benign   Chronic abdominal pain   Gastroparesis   Intractable nausea and vomiting   Chronic wound infection of abdomen   Intractable vomiting with nausea   Angel Price is a 53 y.o. 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  Principal Problem: Abdominal wall wound infection Patient has a history of a gastrectomy with Roux-en-Y anastomosis, multiple abdominal surgeries, recurrent abdominal wall wound infections.  Has history of 2 chronic draining abdominal wall wounds.  Developed a new infraumbilical open area with drainage, abdominal pain, increased redness around the abdominal wall.  Appears to have new abdominal wall wound infection with mild surrounding cellulitis.  No fever or leukocytosis. Patient was seen by GI as outpatient and sent into the ER for admission due to inability to tolerate p.o. antibiotics and dehydration. Plan: --continue IV vancomycin --Surg consult recommended no surgical intervention. --Once condition improved, will switch to oral doxy or Bactrim, per GenSurg rec.  Other Active Problems: Intractable nausea and vomiting: Possibly secondary to gastroparesis --continue antiemetics, PPI --Full liquid diet, advance as tolerated --continue MIVF for now  Hypertension: Continue amlodipine, atenolol   Peptic  ulcer disease: --continue IV PPI --continue sucralfate  Bipolar disorder/depression: Continue Lexapro   DVT prophylaxis: Lovenox Code Status:  Full code Family Communication: N/A  Disposition Plan: Home in 2-3 days Consults called: GenSurg Admission status: changed to inpatient status today   Subjective: BP decreasing, so IV dilaudid d/c'ed.  Pt complained of food coming back up after swallow.  Reported greenish drainage from abdominal wounds.  No fever, dyspnea, chest pain, diarrhea, dysuria, increased swelling.   Objective: Vitals:   02/24/19 0942 02/24/19 0943 02/24/19 1500 02/24/19 2058  BP: 96/73 96/73 113/81 122/82  Pulse:  70 71 70  Resp:   15 18  Temp:   98.1 F (36.7 C) 98.6 F (37 C)  TempSrc:    Oral  SpO2:   98% 99%  Weight:      Height:        Intake/Output Summary (Last 24 hours) at 02/24/2019 2326 Last data filed at 02/24/2019 2039 Gross per 24 hour  Intake 863.9 ml  Output 1200 ml  Net -336.1 ml   Filed Weights   02/23/19 1122 02/23/19 2233 02/24/19 0507  Weight: 59 kg 58.6 kg 58.5 kg    Examination:  Constitutional: NAD, AAOx3 HEENT: conjunctivae and lids normal, EOMI CV: RRR no M,R,G. Distal pulses +2.  No cyanosis.   RESP: CTA B/L, normal respiratory effort  GI: +BS, ND, 2 areas in around umbilicus draining small amount of serous material, no significant surrounding erythema, mild tenderness to palpation  Extremities: No effusions, edema, or tenderness in BLE MSK: normal ROM and strength, no joint enlargement or tenderness of both UE and LE SKIN: warm, dry and intact except for the abdomen Neuro: II - XII grossly intact.  Sensation intact Psych: Normal mood and  affect.  Appropriate judgement and reason   Data Reviewed: I have personally reviewed following labs and imaging studies  CBC: Recent Labs  Lab 02/21/19 1207 02/23/19 1212 02/24/19 0451  WBC 3.1* 3.1* 3.9*  NEUTROABS  --  1.0*  --   HGB 13.7 13.9 10.4*  HCT 39.4 39.8  30.6*  MCV 82.1 80.9 83.2  PLT 335 316 [id number]   Basic Metabolic Panel: Recent Labs  Lab 02/21/19 1207 02/23/19 1212 02/24/19 0451  NA 141 136 139  K 4.1 4.1 3.5  CL 107 101 109  CO2 24 21* 20*  GLUCOSE 126* 83 107*  BUN 22* 8 5*  CREATININE 0.66 0.61 0.61  CALCIUM 8.7* 8.9 8.1*   GFR: Estimated Creatinine Clearance: 68.8 mL/min (by C-G formula based on SCr of 0.61 mg/dL). Liver Function Tests: Recent Labs  Lab 02/21/19 1207 02/23/19 1212  AST 56* 155*  ALT 58* 120*  ALKPHOS 111 153*  BILITOT 0.8 1.4*  PROT 8.3* 8.2*  ALBUMIN 4.4 4.2   Recent Labs  Lab 02/21/19 1207 02/23/19 1212  LIPASE 19 16   No results for input(s): AMMONIA in the last 168 hours. Coagulation Profile: No results for input(s): INR, PROTIME in the last 168 hours. Cardiac Enzymes: No results for input(s): CKTOTAL, CKMB, CKMBINDEX, TROPONINI in the last 168 hours. BNP (last 3 results) No results for input(s): PROBNP in the last 8760 hours. HbA1C: No results for input(s): HGBA1C in the last 72 hours. CBG: No results for input(s): GLUCAP in the last 168 hours. Lipid Profile: No results for input(s): CHOL, HDL, LDLCALC, TRIG, CHOLHDL, LDLDIRECT in the last 72 hours. Thyroid Function Tests: No results for input(s): TSH, T4TOTAL, FREET4, T3FREE, THYROIDAB in the last 72 hours. Anemia Panel: No results for input(s): VITAMINB12, FOLATE, FERRITIN, TIBC, IRON, RETICCTPCT in the last 72 hours. Sepsis Labs: Recent Labs  Lab 02/23/19 1212 02/23/19 1429  LATICACIDVEN 2.4* 1.7    Recent Results (from the past 240 hour(s))  SARS CORONAVIRUS 2 (TAT 6-24 HRS) Nasopharyngeal Nasopharyngeal Swab     Status: None   Collection Time: 02/23/19  7:46 PM   Specimen: Nasopharyngeal Swab  Result Value Ref Range Status   SARS Coronavirus 2 NEGATIVE NEGATIVE Final    Comment: (NOTE) SARS-CoV-2 target nucleic acids are NOT DETECTED. The SARS-CoV-2 RNA is generally detectable in upper and lower respiratory  specimens during the acute phase of infection. Negative results do not preclude SARS-CoV-2 infection, do not rule out co-infections with other pathogens, and should not be used as the sole basis for treatment or other patient management decisions. Negative results must be combined with clinical observations, patient history, and epidemiological information. The expected result is Negative. Fact Sheet for Patients: SugarRoll.be Fact Sheet for Healthcare Providers: https://www.woods-mathews.com/ This test is not yet approved or cleared by the [place] FDA and  has been authorized for detection and/or diagnosis of SARS-CoV-2 by FDA under an Emergency Use Authorization (EUA). This EUA will remain  in effect (meaning this test can be used) for the duration of the COVID-19 declaration under Section 56 4(b)(1) of the Act, 21 U.S.C. section 360bbb-3(b)(1), unless the authorization is terminated or revoked sooner. Performed at [facility], [place] [address] [place], [place] [address]   Aerobic Culture (superficial specimen)     Status: None (Preliminary result)   Collection Time: 02/23/19 10:17 PM   Specimen: Abdomen  Result Value Ref Range Status   Specimen Description   Final    ABDOMEN Performed at  [place] [place], [place] [address]    Special Requests   Final    NONE Performed at [facility], [address], [place], [place] [address]    Gram Stain   Final    NO WBC SEEN NO ORGANISMS SEEN Performed at [facility], [place] [address] [place], [place] [address]    Culture PENDING  Incomplete   Report Status PENDING  Incomplete         Radiology Studies: Ct Abdomen Pelvis W Contrast  Result Date: 02/23/2019 CLINICAL DATA:  Nausea vomiting, abdominal wall fistulas EXAM: CT ABDOMEN AND PELVIS WITH CONTRAST TECHNIQUE: Multidetector CT imaging of the abdomen and pelvis was performed using the standard  protocol following bolus administration of intravenous contrast. CONTRAST:  175mL OMNIPAQUE IOHEXOL 300 MG/ML  SOLN COMPARISON:  Multiple prior studies FINDINGS: Lower chest: No acute abnormality. Hepatobiliary: Fatty infiltration. Post cholecystectomy. No biliary dilatation. Pancreas: Unremarkable. Spleen: Unremarkable. Adrenals/Urinary Tract: Stable left adrenal mass compatible with an adenoma. Right adrenalectomy. Kidneys are unremarkable. Bladder is unremarkable. Stomach/Bowel: There are postsurgical changes of gastrojejunostomy. Small bowel anastomoses are present. There is wall thickening associated with the anastomosis in the right abdomen also present on prior. There is no evidence of bowel obstruction. Small bowel loops are apposed to the ventral abdominal wall likely reflecting adhesions. Vascular/Lymphatic: Aortic atherosclerosis. No enlarged abdominal or pelvic lymph nodes. Reproductive: Status post hysterectomy. No adnexal masses. Other: Soft tissue density in the ventral abdominal wall is similar in appearance extending to the skin surface, which may reflect reported fistulas. Musculoskeletal: No new significant osseous findings. IMPRESSION: No substantial change since most recent study of 11/11/2018. Wall thickening associated with small bowel anastomosis on the right. However, there is no evidence bowel obstruction. Stable appearance of soft tissue density in the ventral abdominal wall extending to the skin surface. No new collection. Electronically Signed   By: Macy Mis M.D.   On: 02/23/2019 16:34        Scheduled Meds: . amLODipine  5 mg Oral Daily  . atenolol  50 mg Oral Daily  . enoxaparin (LOVENOX) injection  40 mg Subcutaneous Q24H  . feeding supplement (ENSURE ENLIVE)  237 mL Oral BID BM  . folic acid  1 mg Oral Daily  . multivitamin-lutein  1 capsule Oral Daily  . mupirocin cream   Topical BID  . nutrition supplement (JUVEN)  1 packet Oral BID BM  . pantoprazole  (PROTONIX) IV  40 mg Intravenous Q24H  . scopolamine  1 patch Transdermal Q72H  . sodium chloride flush  3 mL Intravenous Q12H  . sucralfate  1 g Oral TID PC & HS  . thiamine  100 mg Oral Daily   Continuous Infusions: . sodium chloride    . lactated ringers 100 mL/hr at 02/24/19 2039  . vancomycin 1,000 mg (02/24/19 0521)     LOS: 0 days     Enzo Bi, MD [facility] If 7PM-7AM, please contact night-coverage 02/24/2019, 11:26 PM

## 2019-02-24 NOTE — Progress Notes (Signed)
Pt c/o of cramping to legs and feet. Notified Dr. Scherrie November to make aware, new order for flexeril.

## 2019-02-24 NOTE — Progress Notes (Signed)
Initial Nutrition Assessment  DOCUMENTATION CODES:   Not applicable  INTERVENTION:  -Ensure Enlive po BID, each supplement provides 350 kcal and 20 grams of protein (pt likes strawberry)  -1 packet Juven BID, each packet provides 95 calories, 2.5 grams of protein (collagen), and 9.8 grams of carbohydrate (3 grams sugar); also contains 7 grams of L-arginine and L-glutamine, 300 mg vitamin C, 15 mg vitamin E, 1.2 mcg vitamin B-12, 9.5 mg zinc, 200 mg calcium, and 1.5 g  Calcium Beta-hydroxy-Beta-methylbutyrate to support wound healing  -Ocuvite-lutein po daily, MVI provides 200 mg vit C, 40 mg zinc, 55 mcg selenium, 2 mg copper, 2 mg lutein   -Advance diet as medically feasible  NUTRITION DIAGNOSIS:   Inadequate oral intake related to nausea, vomiting, poor appetite, acute illness(infraumbilical open area with drainage) as evidenced by per patient/family report, energy intake < or equal to 50% for > or equal to 5 days, percent weight loss.  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 Patient reports 5 day history of increased abdominal pain and n/v/d and was advised by outpatient GI to present to ED for admission due to new infraumbilical open area with drainage, increasing abdominal wall redness and mild cellulitis, with inability to tolerate po antibiotics and dehydration.  Met with pt bedside this afternoon, lunch tray delivered just prior to visit. Patient reports tolerating clear liquids, diet advanced to full liquids this morning; ordered cream of  chx soup and vanilla pudding for lunch. Patient reports feeling a little better today, endorses ongoing poor appetite since Friday, denied N/V/D. She reports usually having a good appetitie and intake and eats small meals/snacks throughout the day. Patient stated that her stomach is small and she gets full very quickly; recalls eating mostly fruit, vegetables, fried chicken, baked chicken, and baked fish. Patient reports that she occasionally drinks Boost at home and then requested supplement after tasting the soup, stating "I can't drink anymore of that" and placed styrofoam bowl back on the tray.  Patient endorses 4 lb wt loss over the past 2 weeks, recalls UBW 134-135 lbs. Currently, patient weighs 58.5 kg (128.7 lb) noted per pt report 6.3 lb (4.6%) wt loss which is severe for time frame. Per history, pt with stable 59 kg-60.8 kg over the past 9 months.   I/Os: +1531 since admit UOP: 350 ml since admit Medications reviewed and include: Folic acid, Protonix, Carafate, Thiamine, Vancomycin  Labs: BUN 5 (L), Ca 8.1 (L), WBC 3.9 (L), Hgb 10.4 (L)  NUTRITION - FOCUSED PHYSICAL EXAM: Limited exam d/t patient eating lunch 12/9 Findings: mild fat depletion in buccal region;moderate fat depletion in orbital region; mild muscle depletions in temple and clavicle bone regions. Diet Order:   Diet Order            Diet full liquid Room service appropriate? Yes; Fluid consistency: Thin  Diet effective now              EDUCATION NEEDS:   No education needs have been identified at this time  Skin:  Skin Assessment: Reviewed RN Assessment(wound/incision;dehisced;abdomen)  Last BM:  PTA  Height:   Ht Readings from Last 1 Encounters:  02/23/19 '5\' 3"'  (1.6 m)    Weight:   Wt Readings from Last 1 Encounters:  02/24/19 58.5 kg    Ideal Body Weight:  52.3 kg  BMI:  Body mass index is 22.85 kg/m.  Estimated Nutritional Needs:   Kcal:  1600-1800  Protein:  88-94  Fluid:  >/= 1.6  L/day   Lajuan Lines, RD, LDN Clinical Nutrition Office [phone] After Hours/Weekend Pager: [phone]

## 2019-02-25 LAB — CBC
HCT: 29.6 % — ABNORMAL LOW (ref 36.0–46.0)
Hemoglobin: 9.9 g/dL — ABNORMAL LOW (ref 12.0–15.0)
MCH: 28.6 pg (ref 26.0–34.0)
MCHC: 33.4 g/dL (ref 30.0–36.0)
MCV: 85.5 fL (ref 80.0–100.0)
Platelets: 204 10*3/uL (ref 150–400)
RBC: 3.46 MIL/uL — ABNORMAL LOW (ref 3.87–5.11)
RDW: 15.2 % (ref 11.5–15.5)
WBC: 5.2 10*3/uL (ref 4.0–10.5)
nRBC: 0.4 % — ABNORMAL HIGH (ref 0.0–0.2)

## 2019-02-25 LAB — COMPREHENSIVE METABOLIC PANEL
ALT: 50 U/L — ABNORMAL HIGH (ref 0–44)
AST: 37 U/L (ref 15–41)
Albumin: 3 g/dL — ABNORMAL LOW (ref 3.5–5.0)
Alkaline Phosphatase: 91 U/L (ref 38–126)
Anion gap: 8 (ref 5–15)
BUN: 6 mg/dL (ref 6–20)
CO2: 25 mmol/L (ref 22–32)
Calcium: 8.4 mg/dL — ABNORMAL LOW (ref 8.9–10.3)
Chloride: 108 mmol/L (ref 98–111)
Creatinine, Ser: 0.55 mg/dL (ref 0.44–1.00)
GFR calc Af Amer: >60 mL/min (ref >60)
GFR calc non Af Amer: >60 mL/min (ref >60)
Glucose, Bld: 128 mg/dL — ABNORMAL HIGH (ref 70–99)
Potassium: 4 mmol/L (ref 3.5–5.1)
Sodium: 141 mmol/L (ref 135–145)
Total Bilirubin: 0.5 mg/dL (ref 0.3–1.2)
Total Protein: 6.2 g/dL — ABNORMAL LOW (ref 6.5–8.1)

## 2019-02-25 LAB — MAGNESIUM: Magnesium: 1.6 mg/dL — ABNORMAL LOW (ref 1.7–2.4)

## 2019-02-25 MED ORDER — KETOROLAC TROMETHAMINE 15 MG/ML IJ SOLN
15.0000 mg | Freq: Three times a day (TID) | INTRAMUSCULAR | Status: DC | PRN
  Administered 2019-02-25 – 2019-02-26 (×3): 15 mg via INTRAVENOUS
  Filled 2019-02-25 (×3): qty 1

## 2019-02-25 MED ORDER — PHENOL 1.4 % MT LIQD
1.0000 | OROMUCOSAL | Status: DC | PRN
  Administered 2019-02-25: 1 via OROMUCOSAL
  Filled 2019-02-25: qty 177

## 2019-02-25 MED ORDER — GUAIFENESIN 100 MG/5ML PO SOLN
5.0000 mL | ORAL | Status: DC | PRN
  Administered 2019-02-25 – 2019-02-26 (×3): 100 mg via ORAL
  Filled 2019-02-25 (×3): qty 5

## 2019-02-25 NOTE — Progress Notes (Addendum)
PROGRESS NOTE    Angel Price  [id number] DOB: 1965-05-03 DOA: 02/23/2019 PCP: Rosita Fire, MD    Assessment & Plan:   Active Problems:   History of cocaine abuse ([place])   History of schizoaffective disorder   Bipolar disorder ([place])   Tobacco abuse   Alcohol abuse   History of gastric bypass   Hypothyroid   Lupus ([place])   Essential hypertension, benign   Chronic abdominal pain   Gastroparesis   Intractable nausea and vomiting   Chronic wound infection of abdomen   Intractable vomiting with nausea   Angel Price is a 53 y.o. 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  Principal Problem: Abdominal wall wound infection Patient has a history of a gastrectomy with Roux-en-Y anastomosis, multiple abdominal surgeries, recurrent abdominal wall wound infections.  Has history of 2 chronic draining abdominal wall wounds.  Developed a new infraumbilical open area with drainage, abdominal pain, increased redness around the abdominal wall.  Appears to have new abdominal wall wound infection with mild surrounding cellulitis.  No fever or leukocytosis. Patient was seen by GI as outpatient and sent into the ER for admission due to inability to tolerate p.o. antibiotics and dehydration. Plan: --continue IV vancomycin --Surg consult recommended no surgical intervention. --tomorrow, will switch to oral doxy or Bactrim, per GenSurg rec. --Dressing rec per wound care nurse  nausea and vomiting, resolved Possibly secondary to gastroparesis --continue antiemetics, PPI --advance to regular diet --d/c MIVF   Hypertension: Continue amlodipine, atenolol   Peptic ulcer disease: --continue IV  PPI --continue sucralfate  Bipolar disorder/depression: Continue Lexapro   DVT prophylaxis: Lovenox Code Status:  Full code Family Communication: significant other updated at bedside Disposition Plan: Home tomorrow Consults called: GenSurg   Subjective: Pt reported abdominal pain improved.  Wound care nurse consulted and instructed nurse for abdominal dressing.  Pt able to tolerate solid foods now.  Has been up and walking around.  No fever, dyspnea, chest pain, N/V/D, dysuria, increased swelling.   Objective: Vitals:   02/24/19 2058 02/25/19 0500 02/25/19 1205 02/25/19 1349  BP: 122/82  (!) 132/102 126/84  Pulse: 70  75 73  Resp: 18   20  Temp: 98.6 F (37 C)   98.3 F (36.8 C)  TempSrc: Oral   Oral  SpO2: 99%   98%  Weight:  61.5 kg    Height:        Intake/Output Summary (Last 24 hours) at 02/25/2019 2053 Last data filed at 02/25/2019 1900 Gross per 24 hour  Intake 3818.06 ml  Output --  Net 3818.06 ml   Filed Weights   02/23/19 2233 02/24/19 0507 02/25/19 0500  Weight: 58.6 kg 58.5 kg 61.5 kg    Examination:  Constitutional: NAD, AAOx3 HEENT: conjunctivae and lids normal, EOMI CV: RRR no M,R,G. Distal pulses +2.  No cyanosis.   RESP: CTA B/L, normal respiratory effort  GI: +BS, ND, large surgical dressing covering the mid abdomen Extremities: No effusions, edema, or tenderness in BLE MSK: normal ROM and strength, no joint enlargement or tenderness of both UE and LE SKIN: warm, dry  Neuro: II - XII grossly intact.  Sensation intact Psych: Normal mood and affect.  Appropriate judgement and reason   Data Reviewed: I have personally reviewed following labs and  imaging studies  CBC: Recent Labs  Lab 02/21/19 1207 02/23/19 1212 02/24/19 0451 02/25/19 1203  WBC 3.1* 3.1* 3.9* 5.2  NEUTROABS  --  1.0*  --   --   HGB 13.7 13.9 10.4* 9.9*  HCT 39.4 39.8 30.6* 29.6*  MCV 82.1 80.9 83.2 85.5  PLT 335 316 245 0000000   Basic Metabolic Panel: Recent  Labs  Lab 02/21/19 1207 02/23/19 1212 02/24/19 0451 02/25/19 1203  NA 141 136 139 141  K 4.1 4.1 3.5 4.0  CL 107 101 109 108  CO2 24 21* 20* 25  GLUCOSE 126* 83 107* 128*  BUN 22* 8 5* 6  CREATININE 0.66 0.61 0.61 0.55  CALCIUM 8.7* 8.9 8.1* 8.4*  MG  --   --   --  1.6*   GFR: Estimated Creatinine Clearance: 68.8 mL/min (by C-G formula based on SCr of 0.55 mg/dL). Liver Function Tests: Recent Labs  Lab 02/21/19 1207 02/23/19 1212 02/25/19 1203  AST 56* 155* 37  ALT 58* 120* 50*  ALKPHOS 111 153* 91  BILITOT 0.8 1.4* 0.5  PROT 8.3* 8.2* 6.2*  ALBUMIN 4.4 4.2 3.0*   Recent Labs  Lab 02/21/19 1207 02/23/19 1212  LIPASE 19 16   No results for input(s): AMMONIA in the last 168 hours. Coagulation Profile: No results for input(s): INR, PROTIME in the last 168 hours. Cardiac Enzymes: No results for input(s): CKTOTAL, CKMB, CKMBINDEX, TROPONINI in the last 168 hours. BNP (last 3 results) No results for input(s): PROBNP in the last 8760 hours. HbA1C: No results for input(s): HGBA1C in the last 72 hours. CBG: No results for input(s): GLUCAP in the last 168 hours. Lipid Profile: No results for input(s): CHOL, HDL, LDLCALC, TRIG, CHOLHDL, LDLDIRECT in the last 72 hours. Thyroid Function Tests: No results for input(s): TSH, T4TOTAL, FREET4, T3FREE, THYROIDAB in the last 72 hours. Anemia Panel: No results for input(s): VITAMINB12, FOLATE, FERRITIN, TIBC, IRON, RETICCTPCT in the last 72 hours. Sepsis Labs: Recent Labs  Lab 02/23/19 1212 02/23/19 1429  LATICACIDVEN 2.4* 1.7    Recent Results (from the past 240 hour(s))  SARS CORONAVIRUS 2 (TAT 6-24 HRS) Nasopharyngeal Nasopharyngeal Swab     Status: None   Collection Time: 02/23/19  7:46 PM   Specimen: Nasopharyngeal Swab  Result Value Ref Range Status   SARS Coronavirus 2 NEGATIVE NEGATIVE Final    Comment: (NOTE) SARS-CoV-2 target nucleic acids are NOT DETECTED. The SARS-CoV-2 RNA is generally detectable in  upper and lower respiratory specimens during the acute phase of infection. Negative results do not preclude SARS-CoV-2 infection, do not rule out co-infections with other pathogens, and should not be used as the sole basis for treatment or other patient management decisions. Negative results must be combined with clinical observations, patient history, and epidemiological information. The expected result is Negative. Fact Sheet for Patients: SugarRoll.be Fact Sheet for Healthcare Providers: https://www.woods-mathews.com/ This test is not yet approved or cleared by the [place] FDA and  has been authorized for detection and/or diagnosis of SARS-CoV-2 by FDA under an Emergency Use Authorization (EUA). This EUA will remain  in effect (meaning this test can be used) for the duration of the COVID-19 declaration under Section 56 4(b)(1) of the Act, 21 U.S.C. section 360bbb-3(b)(1), unless the authorization is terminated or revoked sooner. Performed at [facility], [place] [address], [place], [place] [address]   Aerobic Culture (superficial specimen)     Status: None (Preliminary result)   Collection Time: 02/23/19 10:17 PM  Specimen: Abdomen  Result Value Ref Range Status   Specimen Description   Final    ABDOMEN Performed at [facility], [address], [place], [place] [address]    Special Requests   Final    NONE Performed at [facility], [address], [place], [place] [address]    Gram Stain NO WBC SEEN NO ORGANISMS SEEN   Final   Culture   Final    CULTURE REINCUBATED FOR BETTER GROWTH Performed at [facility], [place] [address], [place], [place] [address]    Report Status PENDING  Incomplete         Radiology Studies: No results found.      Scheduled Meds: . amLODipine  5 mg Oral Daily  . atenolol  50 mg Oral Daily  . enoxaparin (LOVENOX) injection  40 mg Subcutaneous Q24H  . feeding supplement (ENSURE  ENLIVE)  237 mL Oral BID BM  . folic acid  1 mg Oral Daily  . multivitamin-lutein  1 capsule Oral Daily  . mupirocin cream   Topical BID  . nutrition supplement (JUVEN)  1 packet Oral BID BM  . pantoprazole (PROTONIX) IV  40 mg Intravenous Q24H  . scopolamine  1 patch Transdermal Q72H  . sodium chloride flush  3 mL Intravenous Q12H  . sucralfate  1 g Oral TID PC & HS  . thiamine  100 mg Oral Daily   Continuous Infusions: . sodium chloride    . lactated ringers 100 mL/hr at 02/25/19 1900  . vancomycin Stopped (02/25/19 0729)     LOS: 1 day     Enzo Bi, MD [facility] If 7PM-7AM, please contact night-coverage 02/25/2019, 8:53 PM

## 2019-02-25 NOTE — TOC Initial Note (Signed)
Transition of Care [facility]) - Initial/Assessment Note    Patient Details  Name: Angel Price MRN: [id number] Date of Birth: [birth year]  Transition of Care [facility]) CM/SW Contact:    Boneta Lucks, RN Phone Number: 02/25/2019, 3:35 PM  Clinical Narrative:     Patient admitted with chronic abdominal pain. Patient also has a high readmission score. Could not reach patient, Spoke with her son Angel Price. He states his mother lives with her boyfriend and is independent. Drives to appointments and no problems getting medications. She has used [facility] in the past for wound care. TOC to follow for this need.    Expected Discharge Plan: [place] Barriers to Discharge: Continued Medical Work up   Patient Goals and CMS Choice Patient states their goals for this hospitalization and ongoing recovery are:: to return home.      Expected Discharge Plan and Services Expected Discharge Plan: [place]     Prior Living Arrangements/Services   Lives with:: Significant Other          Need for Family Participation in Patient Care: Yes (Comment) Care giver support system in place?: Yes (comment)   Criminal Activity/Legal Involvement Pertinent to Current Situation/Hospitalization: No - Comment as needed  Activities of Daily Living Home Assistive Devices/Equipment: None ADL Screening (condition at time of admission) Patient's cognitive ability adequate to safely complete daily activities?: Yes Is the patient deaf or have difficulty hearing?: No Does the patient have difficulty seeing, even when wearing glasses/contacts?: No Does the patient have difficulty concentrating, remembering, or making decisions?: No Patient able to express need for assistance with ADLs?: Yes Does the patient have difficulty dressing or bathing?: No Independently performs ADLs?: Yes (appropriate for developmental age) Does the patient have difficulty walking or climbing  stairs?: No Weakness of Legs: Both Weakness of Arms/Hands: Both  Permission Sought/Granted       Emotional Assessment    Orientation: : Oriented to Self, Oriented to Place, Oriented to  Time, Oriented to Situation Alcohol / Substance Use: Other (comment) Psych Involvement: No (comment)  Admission diagnosis:  QT prolongation [R94.31] Open abdominal wall wound, initial encounter [S31.109A] Intractable vomiting with nausea, unspecified vomiting type [R11.2] Patient Active Problem List   Diagnosis Date Noted  . Intractable vomiting with nausea 02/23/2019  . Abnormal CT scan, colon 12/09/2018  . Abnormal computed tomography of cecum and terminal ileum 12/09/2018  . Cocaine abuse ([place])   . Colitis 11/11/2018  . Hemorrhoidal skin tag   . SBO (small bowel obstruction) ([place])   . Non-intractable vomiting with nausea   . Folate deficiency 05/11/2017  . Abdominal wall fistula   . Cellulitis of abdominal wall 01/02/2017  . Acute left hemiparesis ([place]) 12/05/2016  . Malingering 12/05/2016  . Weakness of left lower extremity 12/02/2016  . CVA (cerebral vascular accident) ([place]) 11/23/2016  . Bright red rectal bleeding 08/22/2016  . Hypotension due to blood loss   . Acute GI bleeding 05/24/2016  . Dieulafoy lesion of duodenum   . History of Billroth II operation   . Gastrointestinal hemorrhage 05/22/2016  . Absolute anemia   . Acute pancreatitis 10/01/2015  . Abnormal CT scan of lung 10/01/2015  . Alcohol intoxication ([place])   . Left-sided weakness   . Psychosomatic factor in physical condition   . Upper GI bleed   . Diverticulosis of colon with hemorrhage   . Chronic wound infection of abdomen 12/22/2014  . Thyroid disease 12/22/2014  . HTN (hypertension)  12/22/2014  . Ataxia 11/01/2014  . Hemorrhoids 04/26/2014  . Lung nodule 04/26/2014  . Diverticulosis   . Gastritis   . Hiatal hernia   . Schatzki's ring   . Acute blood loss anemia 04/25/2014  . Sinus tachycardia  04/25/2014  . Hypokalemia 04/25/2014  . Hematemesis with nausea 04/25/2014  . Lung nodule < 6cm on CT 04/25/2014  . Intractable nausea and vomiting 04/24/2014  . Gastroparesis   . Abdominal pain 04/01/2014  . Chronic abdominal pain 01/21/2014  . Gastroenteritis 12/10/2013  . Chronic abdominal wound infection 08/16/2013  . MDD (major depressive disorder), recurrent episode, severe ([place]) 06/27/2013  . Wrist laceration 06/24/2013  . Diarrhea 05/07/2013  . Rectal bleeding 05/07/2013  . Abnormal LFTs 05/07/2013  . Adrenal mass, left ([place]) 05/07/2013  . Abdominal wall abscess at site of surgical wound 04/19/2013  . Abdominal wall abscess 04/18/2013  . Frequent headaches 03/30/2013  . Sleep difficulties 03/30/2013  . Essential hypertension, benign 03/30/2013  . History of cocaine abuse ([place]) 03/16/2013  . History of schizoaffective disorder 03/16/2013  . Bipolar disorder ([place]) 03/16/2013  . Personality disorder ([place]) 03/16/2013  . Tobacco abuse 03/16/2013  . Alcohol abuse 03/16/2013  . Palpitations 03/16/2013  . Poor vision 03/16/2013  . History of gastric bypass 03/16/2013  . Status post hysterectomy with oophorectomy 03/16/2013  . Hypothyroid 03/16/2013  . Lupus ([place]) 03/16/2013   PCP:  Rosita Fire, MD Pharmacy:   [facility] [address], [place] - [place] [address] [place] [address] Phone: [phone] Fax: [place], [place] [address] [place] [address] Phone: [phone] Fax: [phone]   Readmission Risk Interventions Readmission Risk Prevention Plan 02/25/2019  Transportation Screening Complete  PCP or Specialist Appt within 3-5 Days Not Complete  HRI or Home Care Consult Complete  Social Work Consult for [place] Planning/Counseling Complete  Palliative Care Screening Not Complete  Medication Review Press photographer)  Complete  Some recent data might be hidden

## 2019-02-25 NOTE — Consult Note (Signed)
[facility] Nurse Consult Note: Reason for Consult: chronic, nonhealing wounds on abdomen.  Simultaneously consulted with Dr. Arnoldo Morale (Surgery) and the patient is known to him from previous admissions.  He indicates in his note from yesterday hat no surgical intervention is indicated and provided guidance for wound care via the Orders (Bactroban). I added the twice daily dressing to the Nursing Task List and in response to a query from Nursing, added to top the Bactroban with a dry dressing and secure with paper tape. Wound type:chronic, infectious Pressure Injury POA: N/A  WOC nursing team will not follow, but will remain available to this patient, the nursing and medical teams.  Please re-consult if needed. Thanks, Maudie Flakes, MSN, RN, [place], Arther Abbott  Pager# [phone]

## 2019-02-26 LAB — CBC
HCT: 29.6 % — ABNORMAL LOW (ref 36.0–46.0)
Hemoglobin: 9.9 g/dL — ABNORMAL LOW (ref 12.0–15.0)
MCH: 28.9 pg (ref 26.0–34.0)
MCHC: 33.4 g/dL (ref 30.0–36.0)
MCV: 86.5 fL (ref 80.0–100.0)
Platelets: 206 10*3/uL (ref 150–400)
RBC: 3.42 MIL/uL — ABNORMAL LOW (ref 3.87–5.11)
RDW: 15.5 % (ref 11.5–15.5)
WBC: 7.1 10*3/uL (ref 4.0–10.5)
nRBC: 0 % (ref 0.0–0.2)

## 2019-02-26 LAB — MAGNESIUM: Magnesium: 2.5 mg/dL — ABNORMAL HIGH (ref 1.7–2.4)

## 2019-02-26 MED ORDER — MUPIROCIN CALCIUM 2 % EX CREA: TOPICAL_CREAM | Freq: Two times a day (BID) | CUTANEOUS | 0 refills | Status: DC

## 2019-02-26 MED ORDER — ACETAMINOPHEN 500 MG PO TABS: 1000.0000 mg | ORAL_TABLET | Freq: Three times a day (TID) | ORAL | 0 refills | Status: DC | PRN

## 2019-02-26 MED ORDER — SULFAMETHOXAZOLE-TRIMETHOPRIM 800-160 MG PO TABS: 1.0000 | ORAL_TABLET | Freq: Two times a day (BID) | ORAL | 0 refills | Status: AC

## 2019-02-26 MED ORDER — MAGNESIUM SULFATE 2 GM/50ML IV SOLN
2.0000 g | Freq: Once | INTRAVENOUS | Status: AC
  Administered 2019-02-26: 2 g via INTRAVENOUS
  Filled 2019-02-26: qty 50

## 2019-02-26 MED ORDER — SULFAMETHOXAZOLE-TRIMETHOPRIM 800-160 MG PO TABS
1.0000 | ORAL_TABLET | Freq: Two times a day (BID) | ORAL | Status: DC
  Administered 2019-02-26: 1 via ORAL
  Filled 2019-02-26: qty 1

## 2019-02-26 MED ORDER — CYCLOBENZAPRINE HCL 5 MG PO TABS: 5.0000 mg | ORAL_TABLET | Freq: Three times a day (TID) | ORAL | 0 refills | Status: DC | PRN

## 2019-02-26 NOTE — Care Management Important Message (Signed)
Important Message  Patient Details  Name: Angel Price MRN: [id number] Date of Birth: 1966-01-18   Medicare Important Message Given:  Yes     Tommy Medal 02/26/2019, 3:30 PM

## 2019-02-26 NOTE — Progress Notes (Signed)
Discharge instructions reviewed with patient. Patient verbalized understanding of instructions. Patient discharged home with family in stable condition   

## 2019-02-26 NOTE — Discharge Summary (Signed)
Physician Discharge Summary   BRENLYN BAXENDALE  female DOB: 1965-04-25  [id number]  PCP: Rosita Fire, MD  Admit date: 02/23/2019 Discharge date: 02/26/2019  Admitted From: home Disposition:  home CODE STATUS: Full code  Discharge Instructions    Diet - low sodium heart healthy   Complete by: As directed    Discharge instructions   Complete by: As directed    Cleanse with abdominal wounds with normal saline solution, pat gently dry. Apply Bactroban, top with dry gauze, secure with paper tape.  Change twice daily and as needed if drainage strikes through onto exterior of dressing.  You have received 4 days of IV Vanc antibiotic for your cellulitis.  You will take 10 more days of oral antibiotic with Bactrim 2 times daily.    If your wounds look worse, please return to the hospital.  Dr. Rebekah [place] Course:  For full details, please see H&P, progress notes, consult notes and ancillary notes.  Briefly,  ALLEE BRUSCA a 53 y.o. 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  Abdominal wall wound infection and cellulitis  history of 2 chronic draining abdominal wall wounds Patient has a history of a gastrectomy with Roux-en-Y anastomosis, multiple abdominal surgeries, recurrent abdominal wall wound infections.Developed a new infraumbilical open area with drainage, abdominal pain, increased redness around the abdominal wall. No fever or leukocytosis. Patient was seen by GI as outpatient and sent into the ERfor admission due to inability to tolerate p.o. antibiotics and dehydration.  Pt was started on IV Vanc on presentation.  GenSurg was consulted and recommended no  surgical intervention.  Pt received 3 days of IV Vanc before transitioning to PO Bactrim, per GenSurg rec, and will continue for 10 more days at home.  Wound care consulted and gave wound dressing recommendation which was explained to the pt.  Nausea and vomiting, resolved Possibly secondary to gastroparesis.  Pt was able to tolerate regular diet prior to discharge.  Continued antiemetics, PPI.  Hypertension: Continued amlodipine, atenolol   Peptic ulcer disease: Continued PPI and sucralfate.  Bipolar disorder/depression Continued Lexapro   Discharge Diagnoses:  Active Problems:   History of cocaine abuse ([place])   History of schizoaffective disorder   Bipolar disorder ([place])   Tobacco abuse   Alcohol abuse   History of gastric bypass   Hypothyroid   Lupus (HCC)   Essential hypertension, benign   Chronic abdominal pain   Gastroparesis   Intractable nausea and vomiting   Chronic wound infection of abdomen   Intractable vomiting with nausea    Discharge Instructions:  Allergies as of 02/26/2019      Reactions   Codeine Hives   Morphine And Related Hives   Reglan [metoclopramide] Other (See Comments)   EPS symptoms       Medication List    STOP taking these medications   ibuprofen 800 MG tablet Commonly known as: ADVIL   ondansetron 4 MG disintegrating tablet Commonly known as: Zofran ODT   sucralfate 1 g tablet Commonly known as: Carafate     TAKE these medications   acetaminophen 500 MG tablet Commonly known as: TYLENOL Take 2 tablets (1,000 mg total) by mouth 3 (three) times daily as needed for mild pain or moderate pain.   amLODipine  10 MG tablet Commonly known as: NORVASC Take 0.5 tablets (5 mg total) by mouth daily.   atenolol 50 MG tablet Commonly known as: TENORMIN Take 50 mg by mouth daily. What changed: Another medication with the same name was removed. Continue taking this medication, and follow the directions you see here.    cyclobenzaprine 5 MG tablet Commonly known as: FLEXERIL Take 1 tablet (5 mg total) by mouth 3 (three) times daily as needed for muscle spasms.   dicyclomine 10 MG capsule Commonly known as: Bentyl Take 1 capsule (10 mg total) by mouth 4 (four) times daily -  before meals and at bedtime. For diarrhea and abdominal cramps   escitalopram 20 MG tablet Commonly known as: LEXAPRO Take 20 mg daily by mouth.   folic acid 1 MG tablet Commonly known as: FOLVITE Take 1 tablet (1 mg total) by mouth daily.   mupirocin cream 2 % Commonly known as: BACTROBAN Apply topically 2 (two) times daily.   omeprazole 20 MG capsule Commonly known as: PRILOSEC Take 1 capsule (20 mg total) by mouth daily for 14 days.   promethazine 25 MG tablet Commonly known as: PHENERGAN Take 25 mg by mouth every 6 (six) hours as needed for nausea or vomiting.   promethazine 25 MG suppository Commonly known as: PHENERGAN Place 1 suppository (25 mg total) rectally every 6 (six) hours as needed for nausea or vomiting.   sulfamethoxazole-trimethoprim 800-160 MG tablet Commonly known as: BACTRIM DS Take 1 tablet by mouth every 12 (twelve) hours for 10 days. This is antibiotic for your abdominal draining wounds.   thiamine 100 MG tablet Take 1 tablet (100 mg total) by mouth daily.       Follow-up Information    Rosita Fire, MD. Call.   Specialty: Internal Medicine Why:  to schedule follow up appointment Contact information: [address] [place] [address] [phone]           Allergies  Allergen Reactions  . Codeine Hives  . Morphine And Related Hives  . Reglan [Metoclopramide] Other (See Comments)    EPS symptoms      The results of significant diagnostics from this hospitalization (including imaging, microbiology, ancillary and laboratory) are listed below for reference.   Consultations:   Procedures/Studies: CT ABDOMEN PELVIS W CONTRAST  Result Date:  02/23/2019 CLINICAL DATA:  Nausea vomiting, abdominal wall fistulas EXAM: CT ABDOMEN AND PELVIS WITH CONTRAST TECHNIQUE: Multidetector CT imaging of the abdomen and pelvis was performed using the standard protocol following bolus administration of intravenous contrast. CONTRAST:  164mL OMNIPAQUE IOHEXOL 300 MG/ML  SOLN COMPARISON:  Multiple prior studies FINDINGS: Lower chest: No acute abnormality. Hepatobiliary: Fatty infiltration. Post cholecystectomy. No biliary dilatation. Pancreas: Unremarkable. Spleen: Unremarkable. Adrenals/Urinary Tract: Stable left adrenal mass compatible with an adenoma. Right adrenalectomy. Kidneys are unremarkable. Bladder is unremarkable. Stomach/Bowel: There are postsurgical changes of gastrojejunostomy. Small bowel anastomoses are present. There is wall thickening associated with the anastomosis in the right abdomen also present on prior. There is no evidence of bowel obstruction. Small bowel loops are apposed to the ventral abdominal wall likely reflecting adhesions. Vascular/Lymphatic: Aortic atherosclerosis. No enlarged abdominal or pelvic lymph nodes. Reproductive: Status post hysterectomy. No adnexal masses. Other: Soft tissue density in the ventral abdominal wall is similar in appearance extending to the skin surface, which may reflect reported fistulas. Musculoskeletal: No new significant osseous findings. IMPRESSION: No substantial change since most recent study of 11/11/2018. Wall thickening associated with small bowel anastomosis on the right.  However, there is no evidence bowel obstruction. Stable appearance of soft tissue density in the ventral abdominal wall extending to the skin surface. No new collection. Electronically Signed   By: Macy Mis M.D.   On: 02/23/2019 16:34      Labs: BNP (last 3 results) No results for input(s): BNP in the last 8760 hours. Basic Metabolic Panel: Recent Labs  Lab 02/26/19 1514  MG 2.5*   Liver Function Tests: No results  for input(s): AST, ALT, ALKPHOS, BILITOT, PROT, ALBUMIN in the last 168 hours. No results for input(s): LIPASE, AMYLASE in the last 168 hours. No results for input(s): AMMONIA in the last 168 hours. CBC: Recent Labs  Lab 02/26/19 1514  WBC 7.1  HGB 9.9*  HCT 29.6*  MCV 86.5  PLT 206   Cardiac Enzymes: No results for input(s): CKTOTAL, CKMB, CKMBINDEX, TROPONINI in the last 168 hours. BNP: Invalid input(s): POCBNP CBG: No results for input(s): GLUCAP in the last 168 hours. D-Dimer No results for input(s): DDIMER in the last 72 hours. Hgb A1c No results for input(s): HGBA1C in the last 72 hours. Lipid Profile No results for input(s): CHOL, HDL, LDLCALC, TRIG, CHOLHDL, LDLDIRECT in the last 72 hours. Thyroid function studies No results for input(s): TSH, T4TOTAL, T3FREE, THYROIDAB in the last 72 hours.  Invalid input(s): FREET3 Anemia work up No results for input(s): VITAMINB12, FOLATE, FERRITIN, TIBC, IRON, RETICCTPCT in the last 72 hours. Urinalysis    Component Value Date/Time   COLORURINE YELLOW 02/23/2019 2300   APPEARANCEUR CLEAR 02/23/2019 2300   LABSPEC 1.038 (H) 02/23/2019 2300   PHURINE 5.0 02/23/2019 2300   GLUCOSEU NEGATIVE 02/23/2019 2300   HGBUR NEGATIVE 02/23/2019 2300   BILIRUBINUR NEGATIVE 02/23/2019 2300   KETONESUR 5 (A) 02/23/2019 2300   PROTEINUR NEGATIVE 02/23/2019 2300   UROBILINOGEN 0.2 11/26/2014 0915   NITRITE NEGATIVE 02/23/2019 2300   LEUKOCYTESUR NEGATIVE 02/23/2019 2300   Sepsis Labs Invalid input(s): PROCALCITONIN,  WBC,  LACTICIDVEN Microbiology Recent Results (from the past 240 hour(s))  SARS CORONAVIRUS 2 (TAT 6-24 HRS) Nasopharyngeal Nasopharyngeal Swab     Status: None   Collection Time: 02/23/19  7:46 PM   Specimen: Nasopharyngeal Swab  Result Value Ref Range Status   SARS Coronavirus 2 NEGATIVE NEGATIVE Final    Comment: (NOTE) SARS-CoV-2 target nucleic acids are NOT DETECTED. The SARS-CoV-2 RNA is generally detectable in  upper and lower respiratory specimens during the acute phase of infection. Negative results do not preclude SARS-CoV-2 infection, do not rule out co-infections with other pathogens, and should not be used as the sole basis for treatment or other patient management decisions. Negative results must be combined with clinical observations, patient history, and epidemiological information. The expected result is Negative. Fact Sheet for Patients: SugarRoll.be Fact Sheet for Healthcare Providers: https://www.woods-mathews.com/ This test is not yet approved or cleared by the [place] FDA and  has been authorized for detection and/or diagnosis of SARS-CoV-2 by FDA under an Emergency Use Authorization (EUA). This EUA will remain  in effect (meaning this test can be used) for the duration of the COVID-19 declaration under Section 56 4(b)(1) of the Act, 21 U.S.C. section 360bbb-3(b)(1), unless the authorization is terminated or revoked sooner. Performed at [facility], [place] [address] [place], [place] [address]   Aerobic Culture (superficial specimen)     Status: None   Collection Time: 02/23/19 10:17 PM   Specimen: Abdomen  Result Value Ref Range Status   Specimen Description   Final  ABDOMEN Performed at [facility], [address] [place], [place] [address]    Special Requests   Final    NONE Performed at [facility], [address], [place], [place] [address]    Gram Stain   Final    NO WBC SEEN NO ORGANISMS SEEN Performed at [facility], [place] [address] [place],  [address]    Culture RARE METHICILLIN RESISTANT STAPHYLOCOCCUS AUREUS  Final   Report Status 02/27/2019 FINAL  Final   Organism ID, Bacteria METHICILLIN RESISTANT STAPHYLOCOCCUS AUREUS  Final      Susceptibility   Methicillin resistant staphylococcus aureus - MIC*    CIPROFLOXACIN >=8 RESISTANT Resistant     ERYTHROMYCIN >=8 RESISTANT Resistant      GENTAMICIN <=0.5 SENSITIVE Sensitive     OXACILLIN >=4 RESISTANT Resistant     TETRACYCLINE <=1 SENSITIVE Sensitive     VANCOMYCIN <=0.5 SENSITIVE Sensitive     TRIMETH/SULFA <=10 SENSITIVE Sensitive     CLINDAMYCIN >=8 RESISTANT Resistant     RIFAMPIN <=0.5 SENSITIVE Sensitive     Inducible Clindamycin NEGATIVE Sensitive     * RARE METHICILLIN RESISTANT STAPHYLOCOCCUS AUREUS     Total time spend on discharging this patient, including the last patient exam, discussing the hospital stay, instructions for ongoing care as it relates to all pertinent caregivers, as well as preparing the medical discharge records, prescriptions, and/or referrals as applicable, is 35 minutes.    Enzo Bi, MD  [facility] 03/05/2019, 3:58 AM  If 7PM-7AM, please contact night-coverage

## 2019-02-27 LAB — AEROBIC CULTURE W GRAM STAIN (SUPERFICIAL SPECIMEN): Gram Stain: NONE SEEN

## 2019-03-04 ENCOUNTER — Ambulatory Visit (HOSPITAL_COMMUNITY): Admit: 2019-03-04 | Payer: Medicare Other | Admitting: Gastroenterology

## 2019-03-04 ENCOUNTER — Other Ambulatory Visit: Payer: Self-pay | Admitting: *Deleted

## 2019-03-04 ENCOUNTER — Encounter (HOSPITAL_COMMUNITY): Payer: Self-pay

## 2019-03-04 DIAGNOSIS — F329 Major depressive disorder, single episode, unspecified: Secondary | ICD-10-CM

## 2019-03-04 DIAGNOSIS — F32A Depression, unspecified: Secondary | ICD-10-CM

## 2019-03-04 DIAGNOSIS — L930 Discoid lupus erythematosus: Secondary | ICD-10-CM | POA: Diagnosis not present

## 2019-03-04 DIAGNOSIS — K2100 Gastro-esophageal reflux disease with esophagitis, without bleeding: Secondary | ICD-10-CM | POA: Diagnosis not present

## 2019-03-04 SURGERY — COLONOSCOPY WITH PROPOFOL
Anesthesia: Monitor Anesthesia Care
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
## 2019-03-10 ENCOUNTER — Encounter (HOSPITAL_BASED_OUTPATIENT_CLINIC_OR_DEPARTMENT_OTHER): Payer: Self-pay

## 2019-03-10 ENCOUNTER — Emergency Department (HOSPITAL_BASED_OUTPATIENT_CLINIC_OR_DEPARTMENT_OTHER)
Admission: EM | Admit: 2019-03-10 | Discharge: 2019-03-10 | Disposition: A | Discharge status: Home / Self Care | Payer: Medicare Other | Attending: Emergency Medicine | Admitting: Emergency Medicine

## 2019-03-10 ENCOUNTER — Other Ambulatory Visit: Payer: Self-pay

## 2019-03-10 DIAGNOSIS — F17210 Nicotine dependence, cigarettes, uncomplicated: Secondary | ICD-10-CM | POA: Insufficient documentation

## 2019-03-10 DIAGNOSIS — Z79899 Other long term (current) drug therapy: Secondary | ICD-10-CM | POA: Diagnosis not present

## 2019-03-10 DIAGNOSIS — M546 Pain in thoracic spine: Secondary | ICD-10-CM | POA: Diagnosis not present

## 2019-03-10 DIAGNOSIS — I10 Essential (primary) hypertension: Secondary | ICD-10-CM | POA: Insufficient documentation

## 2019-03-10 LAB — URINALYSIS, ROUTINE W REFLEX MICROSCOPIC
Bilirubin Urine: NEGATIVE
Glucose, UA: NEGATIVE mg/dL
Hgb urine dipstick: NEGATIVE
Ketones, ur: NEGATIVE mg/dL
Leukocytes,Ua: NEGATIVE
Nitrite: NEGATIVE
Protein, ur: NEGATIVE mg/dL
Specific Gravity, Urine: 1.015 (ref 1.005–1.030)
pH: 6 (ref 5.0–8.0)

## 2019-03-10 MED ORDER — TRAMADOL HCL 50 MG PO TABS: 50.0000 mg | ORAL_TABLET | Freq: Two times a day (BID) | ORAL | 0 refills | Status: DC | PRN

## 2019-03-10 MED ORDER — TRAMADOL HCL 50 MG PO TABS
50.0000 mg | ORAL_TABLET | Freq: Once | ORAL | Status: AC
  Administered 2019-03-10: 50 mg via ORAL
  Filled 2019-03-10: qty 1
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
## 2019-03-10 NOTE — ED Triage Notes (Signed)
Pt c/o bilat flank pain x 1 week-cramping in legs and hands x 2 days-NAD-steady gait

## 2019-03-15 ENCOUNTER — Encounter: Payer: Self-pay | Admitting: *Deleted

## 2019-03-15 ENCOUNTER — Other Ambulatory Visit: Payer: Self-pay | Admitting: *Deleted
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
## 2019-03-18 IMAGING — CT CT ABD-PELV W/ CM
2 of 4 series · 15 of 46 positions shown, 17 images · IV contrast (Isovue)
Comparison: 09/30/2017 and 04/18/2013

CLINICAL DATA: 52-year-old with a right lower quadrant pain with
nausea. History of right adrenal mass surgery.

EXAM:
CT ABDOMEN AND PELVIS WITH CONTRAST
TECHNIQUE: Multidetector CT imaging of the abdomen and pelvis was performed
using the standard protocol following bolus administration of
intravenous contrast.
CONTRAST:  30mL GJ89ME-PJJ IOPAMIDOL (GJ89ME-PJJ) INJECTION 61%,
75mL OMNIPAQUE IOHEXOL 300 MG/ML SOLN

[Series 2: axial st · axial · 0.68mm/px · z∈[+881,+1311]mm · 12 of 96 slices shown, 14 images]
[im 5/96  soft-tissue]
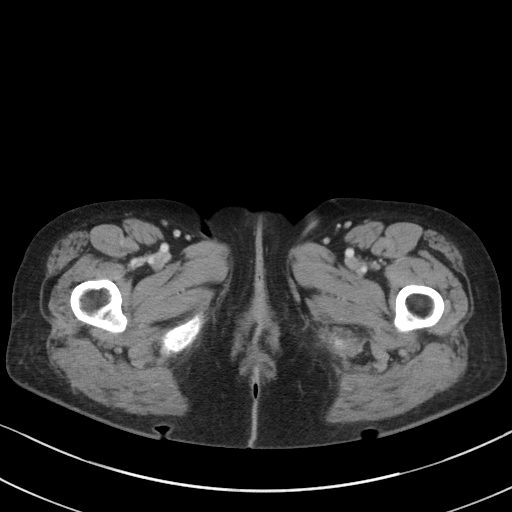
[im 5/96  bone]
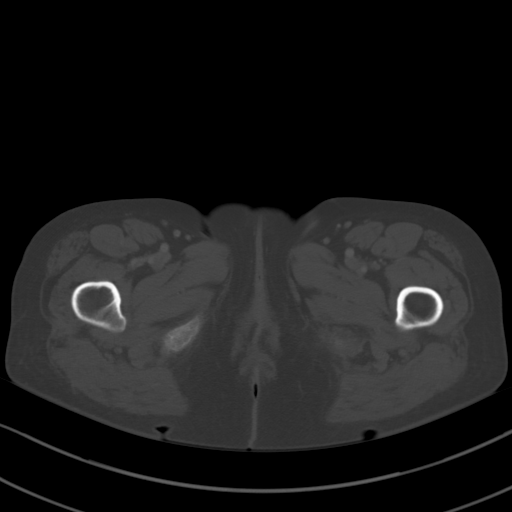
[im 14/96  soft-tissue]
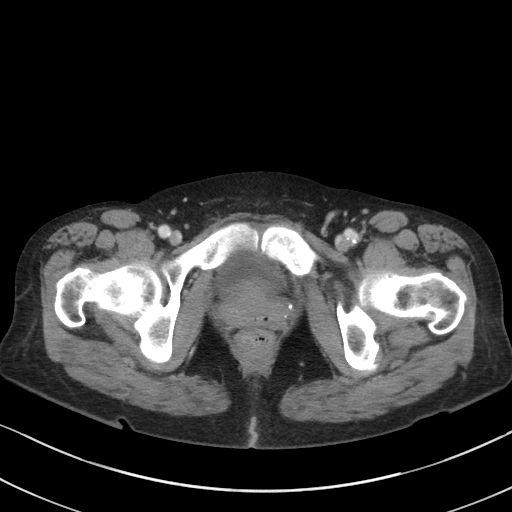
[im 23/96  soft-tissue]
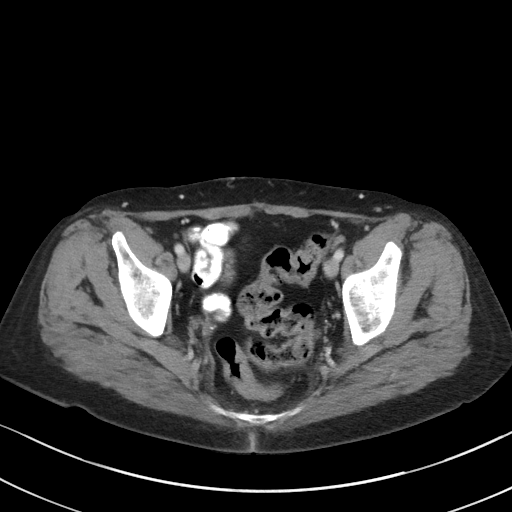
[im 28/96  soft-tissue]
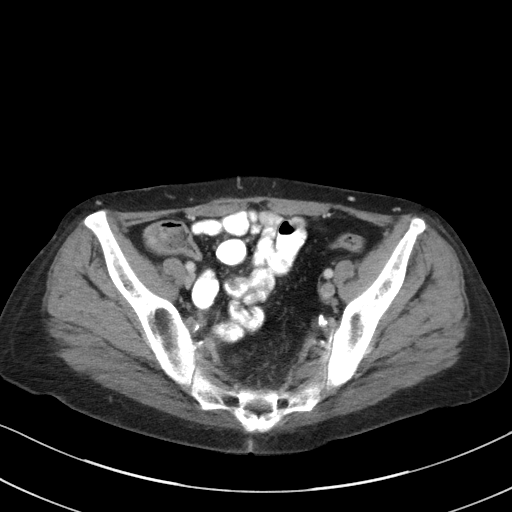
[im 37/96  soft-tissue]
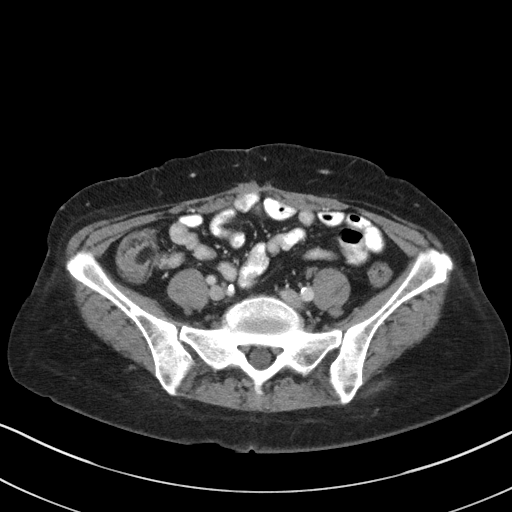
[im 46/96  soft-tissue]
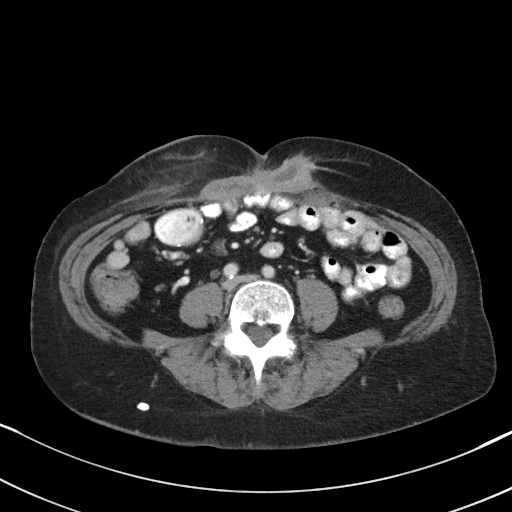
[im 50/96  soft-tissue]
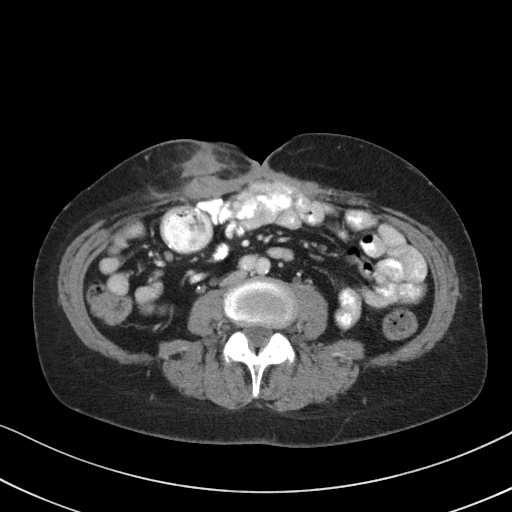
[im 59/96  soft-tissue]
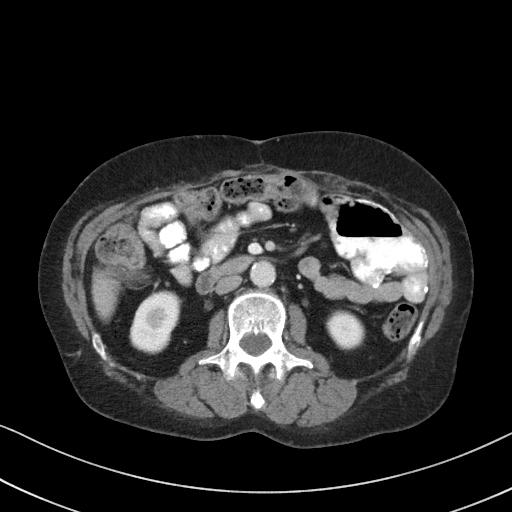
[im 68/96  soft-tissue]
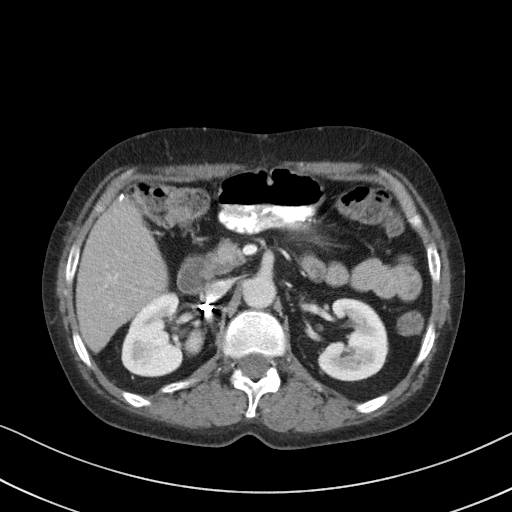
[im 68/96  bone]
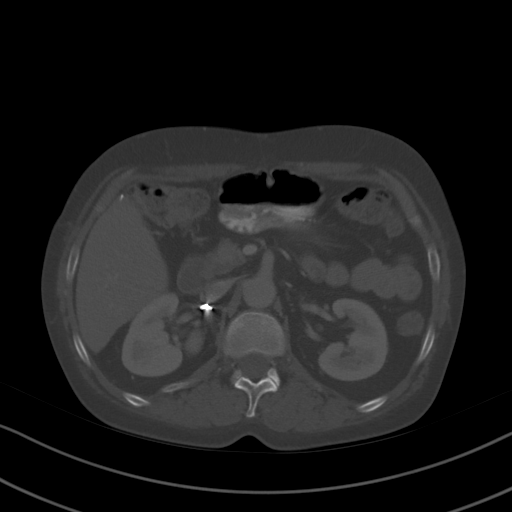
[im 73/96  soft-tissue]
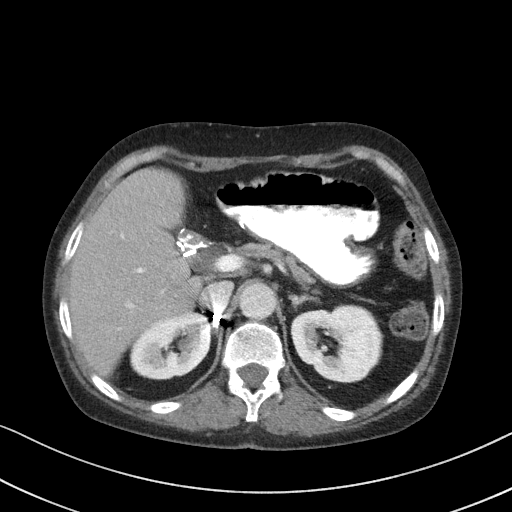
[im 82/96  soft-tissue]
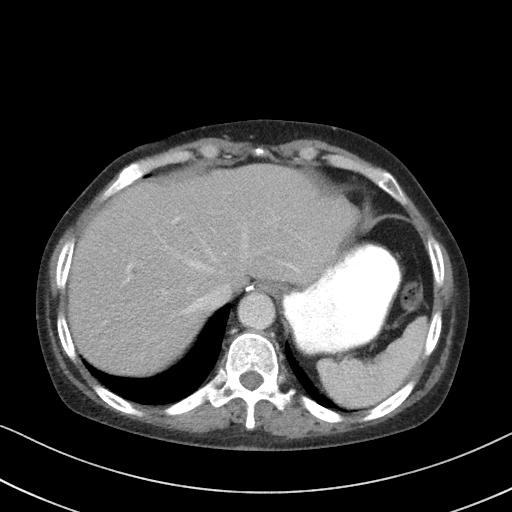
[im 91/96  soft-tissue]
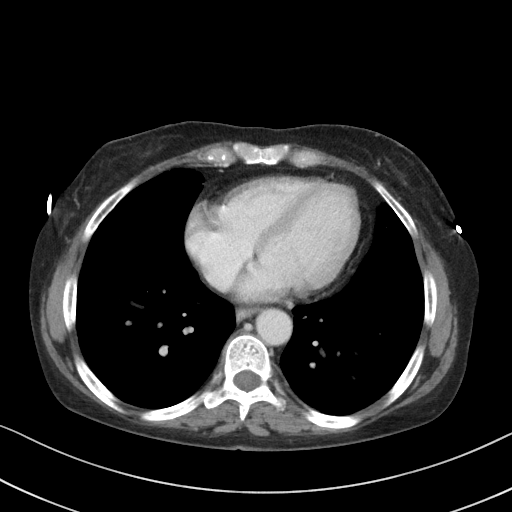

[Series 6: coronal st · coronal · 0.84mm/px · 3 of 76 slices shown]
[im 26/76  soft-tissue]
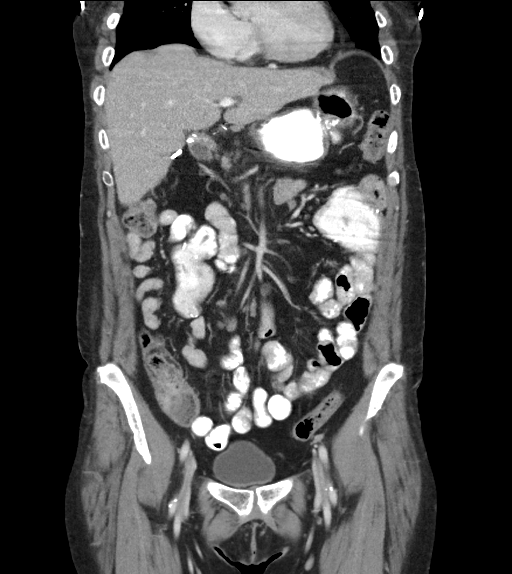
[im 34/76  soft-tissue]
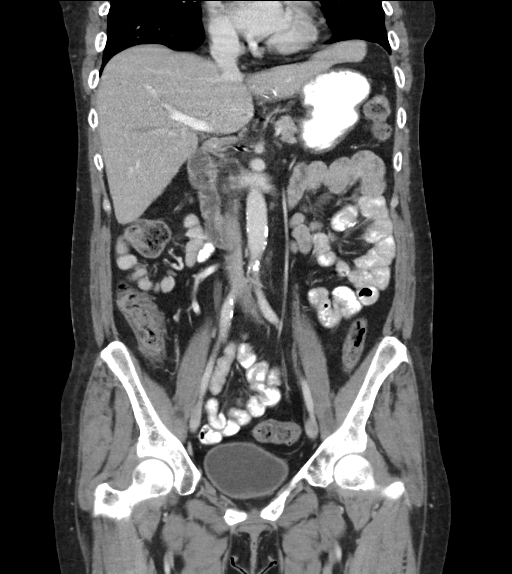
[im 42/76  soft-tissue]
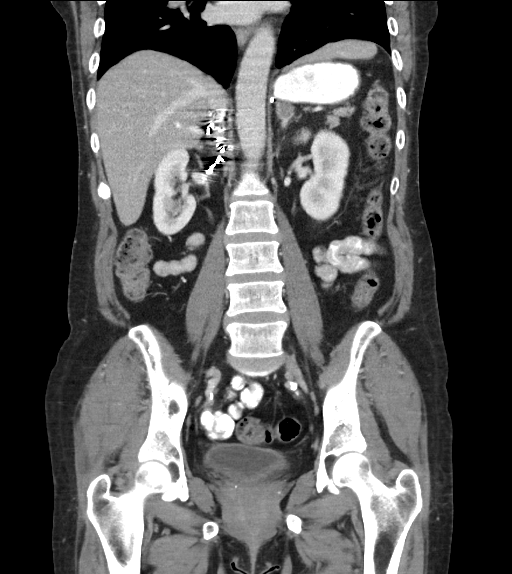

[15 of 46 positions shown; findings below may reference images not displayed]

FINDINGS: Lower chest: Mild basilar atelectasis at the lung bases. No
significant pleural effusions.

Hepatobiliary: Gallbladder has been removed. There is mild
intrahepatic and extrahepatic biliary dilatation that appears
stable. Main portal venous system is patent.

Pancreas: Unremarkable. No pancreatic ductal dilatation or
surrounding inflammatory changes.

Spleen: Normal in size without focal abnormality.

Adrenals/Urinary Tract: Stable left adrenal nodule measuring roughly
2.1 cm and not significantly changed since the CT on 04/18/2013.
Changes compatible with right adrenalectomy. Normal appearance of
both kidneys without hydronephrosis. No suspicious renal lesions.
Urinary bladder is unremarkable.

Stomach/Bowel: Postsurgical changes compatible with a Billroth
surgery with gastrojejunostomy. Oral contrast within the stomach and
small bowel. There is no evidence for small bowel dilatation or
obstruction. Question a small appendix.

Vascular/Lymphatic: Atherosclerotic disease in the aorta and iliac
arteries without aneurysm. No significant lymph node enlargement in
the abdomen or pelvis.

Reproductive: Status post hysterectomy. No adnexal masses.

Other: There is chronic soft tissue thickening along the anterior
abdominal wall near the umbilicus. This soft tissue thickening
measures roughly 7.5 cm in width and similar to the previous
examination. Again noted is low-density material in the central
aspect of this soft tissue thickening. Evidence for old cutaneous
fistulous on both sides of this soft tissue collection. There is
concern for slightly increased inflammation along the right side of
this collection in the right anterior abdominal wall on sequence 2,
image 48. Superficial component measures 1.6 x 2.0 cm which appears
to be slightly enlarged and there continues to be subcutaneous edema
in this area. Negative for free intraperitoneal air or fluid.

Musculoskeletal: No acute bone abnormality.
IMPRESSION: No acute intra-abdominal process.

Chronic inflammatory process in the periumbilical soft tissues.
Cannot exclude slight enlargement of this subcutaneous process on
the right side and cannot exclude a developing fluid collection.
Targeted ultrasound imaging in this area may be useful to look for a
small fluid collection.

Extensive postsurgical changes.

## 2019-03-22 ENCOUNTER — Other Ambulatory Visit: Payer: Self-pay | Admitting: *Deleted

## 2019-03-22 NOTE — Patient Outreach (Signed)
[facility]) Care Management  03/22/2019  Angel Price [birth year] [id number]  CSW was scheduled to begin providing counseling and supportive services to patient today, to help patient better cope with symptoms of depression; however, CSW was only able to make brief contact with patient, as patient reported that she is "not feeling too well today", requesting that CSW try contacting her again next week to begin providing services.  CSW voiced understanding and was agreeable to this plan, explaining to patient that CSW will outreach to her again on Monday, March 29, 2019, around 10:00am.  During the phone conversation on the 11th, CSW will confirm that patient received the packet of resource information mailed to her home by CSW on March 15, 2019.  [name], BSW, MSW, LCSW  Licensed Education officer, [facility]  Mailing [place] [address] [place], [place] [address] Physical Address-[address], [place], [place] [address] Toll Free Main # [phone] Fax # [phone] Cell # [phone]  Office # [phone] [name]

## 2019-03-29 ENCOUNTER — Encounter: Payer: Self-pay | Admitting: *Deleted

## 2019-03-29 ENCOUNTER — Other Ambulatory Visit: Payer: Self-pay | Admitting: *Deleted

## 2019-03-29 NOTE — Patient Outreach (Signed)
[facility]) Care Management  03/29/2019  LEVERNE TESSLER [birth year] [id number]   CSW was able to make contact with patient today to follow-up regarding social work services and resources, as well as to try and provide counseling and supportive services for symptoms of depression.  Patient admitted to "doing well", indicating that "all negative symptoms have been resolved".  Patient went on to explain that she is no longer interested in receiving counseling and supportive services for symptoms of depression, believing that she will be able to better manage symptoms if they arise again in the future..     CSW will perform a case closure on patient, as all goals of treatment have been met from social work standpoint and no additional social work needs have been identified at this time.  CSW will fax an update to patient's Primary Care Physician, Dr. Rosita Fire to ensure that they are aware of CSW's involvement with patient's plan of care.  CSW was able to confirm that patient has the correct contact information for CSW, encouraging patient to contact CSW directly if she changes her mind or if additional social work needs arise in the near future.  [name], BSW, MSW, LCSW  Licensed Education officer, environmental [facility]  Mailing [place] [address], [place], [place] [address] Physical Address-[address], [place],  [address] Toll Free Main # [phone] Fax # [phone] Cell # [phone]  Office # [phone] [name]

## 2019-03-29 NOTE — Progress Notes (Deleted)
54 y.o. 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 Due to CT findings of colitis in August 2020, she underwent colonoscopy. Pancolonic diverticulosis, normal TI, one 5 mm polyp in rectum (tubular adenoma). Somewhat friable hemorrhagic mucosa in area of IC valve/cecum, possibly related to scope trauma s/p biopsy. She is followed by Dr. Ladona Horns in [place] due to chroni fistulas. Last seen by Dr. Ladona Horns in Sept 2020 with plans for updated CT to rule out abscess vs fistulas. At last visit, she was sent to the ED due to new purulent drainage and admitted for abdominal wound infection and cellulitis.

## 2019-03-30 ENCOUNTER — Encounter: Payer: Self-pay | Admitting: Internal Medicine

## 2019-03-30 ENCOUNTER — Ambulatory Visit: Payer: Medicare Other | Admitting: Gastroenterology

## 2019-03-30 ENCOUNTER — Telehealth: Payer: Self-pay | Admitting: Internal Medicine

## 2019-03-30 NOTE — Telephone Encounter (Signed)
PATIENT WAS A NO SHOW AND LETTER SENT  °

## 2019-04-04 DIAGNOSIS — E039 Hypothyroidism, unspecified: Secondary | ICD-10-CM | POA: Diagnosis not present

## 2019-04-04 DIAGNOSIS — K2100 Gastro-esophageal reflux disease with esophagitis, without bleeding: Secondary | ICD-10-CM | POA: Diagnosis not present

## 2019-04-15 DIAGNOSIS — L02211 Cutaneous abscess of abdominal wall: Secondary | ICD-10-CM | POA: Diagnosis not present

## 2019-04-15 DIAGNOSIS — K2100 Gastro-esophageal reflux disease with esophagitis, without bleeding: Secondary | ICD-10-CM | POA: Diagnosis not present

## 2019-04-15 DIAGNOSIS — I10 Essential (primary) hypertension: Secondary | ICD-10-CM | POA: Diagnosis not present

## 2019-05-02 ENCOUNTER — Other Ambulatory Visit: Payer: Self-pay

## 2019-05-02 ENCOUNTER — Encounter (HOSPITAL_COMMUNITY): Payer: Self-pay

## 2019-05-02 ENCOUNTER — Emergency Department (HOSPITAL_COMMUNITY): Payer: Medicare Other

## 2019-05-02 ENCOUNTER — Emergency Department (HOSPITAL_COMMUNITY)
Admission: EM | Admit: 2019-05-02 | Discharge: 2019-05-03 | Disposition: A | Discharge status: Home / Self Care | Payer: Medicare Other | Attending: Emergency Medicine | Admitting: Emergency Medicine

## 2019-05-02 DIAGNOSIS — Z79899 Other long term (current) drug therapy: Secondary | ICD-10-CM | POA: Diagnosis not present

## 2019-05-02 DIAGNOSIS — R109 Unspecified abdominal pain: Secondary | ICD-10-CM | POA: Diagnosis not present

## 2019-05-02 DIAGNOSIS — K920 Hematemesis: Secondary | ICD-10-CM | POA: Diagnosis present

## 2019-05-02 DIAGNOSIS — G8929 Other chronic pain: Secondary | ICD-10-CM | POA: Insufficient documentation

## 2019-05-02 DIAGNOSIS — F17210 Nicotine dependence, cigarettes, uncomplicated: Secondary | ICD-10-CM | POA: Insufficient documentation

## 2019-05-02 DIAGNOSIS — R112 Nausea with vomiting, unspecified: Secondary | ICD-10-CM | POA: Diagnosis not present

## 2019-05-02 DIAGNOSIS — R1110 Vomiting, unspecified: Secondary | ICD-10-CM | POA: Diagnosis not present

## 2019-05-02 DIAGNOSIS — I10 Essential (primary) hypertension: Secondary | ICD-10-CM | POA: Insufficient documentation

## 2019-05-02 LAB — COMPREHENSIVE METABOLIC PANEL
ALT: 22 U/L (ref 0–44)
AST: 23 U/L (ref 15–41)
Albumin: 3.7 g/dL (ref 3.5–5.0)
Alkaline Phosphatase: 69 U/L (ref 38–126)
Anion gap: 10 (ref 5–15)
BUN: 8 mg/dL (ref 6–20)
CO2: 21 mmol/L — ABNORMAL LOW (ref 22–32)
Calcium: 8.7 mg/dL — ABNORMAL LOW (ref 8.9–10.3)
Chloride: 105 mmol/L (ref 98–111)
Creatinine, Ser: 0.43 mg/dL — ABNORMAL LOW (ref 0.44–1.00)
GFR calc Af Amer: >60 mL/min (ref >60)
GFR calc non Af Amer: >60 mL/min (ref >60)
Glucose, Bld: 103 mg/dL — ABNORMAL HIGH (ref 70–99)
Potassium: 4 mmol/L (ref 3.5–5.1)
Sodium: 136 mmol/L (ref 135–145)
Total Bilirubin: 0.7 mg/dL (ref 0.3–1.2)
Total Protein: 7.3 g/dL (ref 6.5–8.1)

## 2019-05-02 LAB — URINALYSIS, ROUTINE W REFLEX MICROSCOPIC
Bilirubin Urine: NEGATIVE
Glucose, UA: NEGATIVE mg/dL
Hgb urine dipstick: NEGATIVE
Ketones, ur: NEGATIVE mg/dL
Leukocytes,Ua: NEGATIVE
Nitrite: NEGATIVE
Protein, ur: NEGATIVE mg/dL
Specific Gravity, Urine: 1.002 — ABNORMAL LOW (ref 1.005–1.030)
pH: 7 (ref 5.0–8.0)

## 2019-05-02 LAB — CBC WITH DIFFERENTIAL/PLATELET
Abs Immature Granulocytes: 0.01 10*3/uL (ref 0.00–0.07)
Basophils Absolute: 0 10*3/uL (ref 0.0–0.1)
Basophils Relative: 1 %
Eosinophils Absolute: 0.1 10*3/uL (ref 0.0–0.5)
Eosinophils Relative: 2 %
HCT: 34.7 % — ABNORMAL LOW (ref 36.0–46.0)
Hemoglobin: 12.2 g/dL (ref 12.0–15.0)
Immature Granulocytes: 0 %
Lymphocytes Relative: 58 %
Lymphs Abs: 2.3 10*3/uL (ref 0.7–4.0)
MCH: 28.7 pg (ref 26.0–34.0)
MCHC: 35.2 g/dL (ref 30.0–36.0)
MCV: 81.6 fL (ref 80.0–100.0)
Monocytes Absolute: 0.4 10*3/uL (ref 0.1–1.0)
Monocytes Relative: 11 %
Neutro Abs: 1.1 10*3/uL — ABNORMAL LOW (ref 1.7–7.7)
Neutrophils Relative %: 28 %
Platelets: 311 10*3/uL (ref 150–400)
RBC: 4.25 MIL/uL (ref 3.87–5.11)
RDW: 14.8 % (ref 11.5–15.5)
WBC: 4 10*3/uL (ref 4.0–10.5)
nRBC: 0 % (ref 0.0–0.2)

## 2019-05-02 LAB — LIPASE, BLOOD: Lipase: 15 U/L (ref 11–51)

## 2019-05-02 MED ORDER — HYDROMORPHONE HCL 1 MG/ML IJ SOLN
0.5000 mg | Freq: Once | INTRAMUSCULAR | Status: AC
  Administered 2019-05-02: 0.5 mg via INTRAVENOUS
  Filled 2019-05-02: qty 1

## 2019-05-02 MED ORDER — PROCHLORPERAZINE EDISYLATE 10 MG/2ML IJ SOLN
10.0000 mg | Freq: Once | INTRAMUSCULAR | Status: AC
  Administered 2019-05-02: 10 mg via INTRAVENOUS
  Filled 2019-05-02: qty 2

## 2019-05-02 MED ORDER — SODIUM CHLORIDE 0.9 % IV BOLUS
1000.0000 mL | Freq: Once | INTRAVENOUS | Status: AC
  Administered 2019-05-02: 1000 mL via INTRAVENOUS

## 2019-05-02 NOTE — ED Provider Notes (Signed)
[facility] DEPT Provider Note   CSN: [id number] Arrival date & time: 05/02/19  2155   History Chief Complaint  Patient presents with  . Hematemesis    Angel Price is a 54 y.o. female.  The history is provided by the patient.  She has history of hypertension, lupus, chronic abdominal wound and comes in with mid abdominal pain which started yesterday.  There is associated nausea, vomiting, diarrhea.  Today, she noted some streaks of blood in the emesis.  Abdominal pain is moderately severe and she rates it at 8/10.  Nothing makes it better, nothing makes it worse.  It is not improved following emesis or bowel movement.  She has chronic draining abdominal wounds which have not changed.  She denies fever or chills.  She has been using promethazine suppositories without benefit.  Past Medical History:  Diagnosis Date  . Abscess    soft tissue  . Adrenal mass ([place])   . Alcohol abuse   . Anxiety   . Blood transfusion without reported diagnosis   . Chronic abdominal pain   . Chronic wound infection of abdomen   . Colon polyp    colonoscopy 04/2014  . Depression   . Diverticulosis    colonoscopy 04/2014 moderat pan colonic  . Drug-seeking behavior   . Gastritis    EGD 05/2014  . Gastroparesis Nov 2015  . GERD (gastroesophageal reflux disease)   . Hemorrhoid    internal large  . Hiatal hernia   . History of Billroth II operation   . Hypertension   . Lung nodule    CT 02/2014 needs repeat 1 month  . Lung nodule < 6cm on CT 04/25/2014  . Lupus ([place])   . Malingering   . Nausea and vomiting    chronic, recurrent  . Pancreatitis   . Schatzki's ring    patent per EGD 04/2014  . Sickle cell trait ([place])   . Suicide attempt ([place])   . 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 Intractable vomiting with nausea 02/23/2019  . Abnormal CT scan, colon 12/09/2018  . Abnormal computed  tomography of cecum and terminal ileum 12/09/2018  . Cocaine abuse ([place])   . Colitis 11/11/2018  . Hemorrhoidal skin tag   . SBO (small bowel obstruction) ([place])   . Non-intractable vomiting with nausea   . Folate deficiency 05/11/2017  . Abdominal wall fistula   . Cellulitis of abdominal wall 01/02/2017  . Acute left hemiparesis ([place]) 12/05/2016  . Malingering 12/05/2016  . Weakness of left lower extremity 12/02/2016  . CVA (cerebral vascular accident) ([place]) 11/23/2016  . Bright red rectal bleeding 08/22/2016  . Hypotension due to blood loss   . Acute GI bleeding 05/24/2016  . Dieulafoy lesion of duodenum   . History of Billroth II operation   . Gastrointestinal hemorrhage 05/22/2016  . Absolute anemia   . Acute pancreatitis 10/01/2015  . Abnormal CT scan of lung 10/01/2015  . Alcohol intoxication ([place])   . Left-sided weakness   . Psychosomatic factor in physical condition   . Upper GI bleed   . Diverticulosis of colon with hemorrhage   . Chronic wound infection of abdomen 12/22/2014  . Thyroid disease 12/22/2014  . HTN (hypertension) 12/22/2014  . Ataxia 11/01/2014  . Hemorrhoids 04/26/2014  . Lung nodule 04/26/2014  . Diverticulosis   . Gastritis   . Hiatal hernia   .  Schatzki's ring   . Acute blood loss anemia 04/25/2014  . Sinus tachycardia 04/25/2014  . Hypokalemia 04/25/2014  . Hematemesis with nausea 04/25/2014  . Lung nodule < 6cm on CT 04/25/2014  . Intractable nausea and vomiting 04/24/2014  . Gastroparesis   . Abdominal pain 04/01/2014  . Chronic abdominal pain 01/21/2014  . Gastroenteritis 12/10/2013  . Chronic abdominal wound infection 08/16/2013  . MDD (major depressive disorder), recurrent episode, severe ([place]) 06/27/2013  . Wrist laceration 06/24/2013  . Diarrhea 05/07/2013  . Rectal bleeding 05/07/2013  . Abnormal LFTs 05/07/2013  . Adrenal mass, left ([place]) 05/07/2013  . Abdominal wall abscess at site of surgical wound 04/19/2013  . Abdominal  wall abscess 04/18/2013  . Frequent headaches 03/30/2013  . Sleep difficulties 03/30/2013  . Essential hypertension, benign 03/30/2013  . History of cocaine abuse ([place]) 03/16/2013  . History of schizoaffective disorder 03/16/2013  . Bipolar disorder ([place]) 03/16/2013  . Personality disorder ([place]) 03/16/2013  . Tobacco abuse 03/16/2013  . Alcohol abuse 03/16/2013  . Palpitations 03/16/2013  . Poor vision 03/16/2013  . History of gastric bypass 03/16/2013  . Status post hysterectomy with oophorectomy 03/16/2013  . Hypothyroid 03/16/2013  . Lupus ([place]) 03/16/2013    Past Surgical History:  Procedure Laterality Date  . ABDOMINAL HYSTERECTOMY  2013   [place]  . ABDOMINAL SURGERY    . ADRENALECTOMY Right   . AGILE CAPSULE N/A 01/05/2015   Procedure: AGILE CAPSULE;  Surgeon: Daneil Dolin, MD;  Location: [facility];  Service: Endoscopy;  Laterality: N/A;  0700  . Billroth II procedure      [place], first 2000, 2005/2006.  Marland Kitchen BIOPSY  05/20/2013   Procedure: BIOPSIES OF ASCENDING AND SIGMOID COLON;  Surgeon: Daneil Dolin, MD;  Location: [facility] ORS;  Service: Endoscopy;;  . BIOPSY  04/26/2014   Procedure: BIOPSIES;  Surgeon: Danie Binder, MD;  Location: [facility] ORS;  Service: Endoscopy;;  . BIOPSY  12/24/2018   Procedure: BIOPSY;  Surgeon: Daneil Dolin, MD;  Location: [facility];  Service: Endoscopy;;  colon  . CHOLECYSTECTOMY    . COLONOSCOPY     in [place]  . COLONOSCOPY WITH PROPOFOL N/A 05/20/2013   Dr.Rourk- inadequate prep, normal appearing rectum, grossly normal colon aside from pancolonic diverticula, normal terminal ileum bx= unremarkable colonic mucosa. Due for early interval 2016.   Marland Kitchen COLONOSCOPY WITH PROPOFOL N/A 04/26/2014   [id number] ileum/one colon polyp removed/moderate pan-colonic diverticulosis/large internal hemorrhoids  . COLONOSCOPY WITH PROPOFOL N/A 12/23/2014   Dr.Rourk- minimal internal hemorrhoids, pancolonic diverticulosis  . COLONOSCOPY WITH PROPOFOL N/A  08/23/2016   Procedure: COLONOSCOPY WITH PROPOFOL;  Surgeon: Danie Binder, MD;  [facility];  Service: Endoscopy;  Laterality: N/A;  . COLONOSCOPY WITH PROPOFOL N/A 12/24/2018   Pancolonic diverticulosis, normal TI, one 5 mm polyp in rectum (tubular adenoma). Somewhat friable hemorrhagic mucosa in area of IC valve/cecum, possibly related to scope trauma s/p biopsy.   . DEBRIDEMENT OF ABDOMINAL WALL ABSCESS N/A 02/08/2013   Procedure: DEBRIDEMENT OF ABDOMINAL WALL ABSCESS;  Surgeon: Jamesetta So, MD;  Location: [facility];  Service: General;  Laterality: N/A;  . ESOPHAGOGASTRODUODENOSCOPY (EGD) WITH PROPOFOL N/A 05/20/2013   Dr.Rourk- s/p prior gastric surgery with normal esophagus, residual gastric mucosa and patent efferent limb  . ESOPHAGOGASTRODUODENOSCOPY (EGD) WITH PROPOFOL N/A 02/03/2014   Dr. Gala Romney:  s/p hemigastrectomy with retained gastric contents. Residual gastric mucosa and efferent limb appeared normal otherwise. Query gastroparesis.   Marland Kitchen ESOPHAGOGASTRODUODENOSCOPY (EGD)  WITH PROPOFOL N/A 04/26/2014   [id number] ring/small HH/mild non-erosive gasrtitis/normal anastomosis  . ESOPHAGOGASTRODUODENOSCOPY (EGD) WITH PROPOFOL N/A 12/23/2014   Dr.Rourk- s/p prior hemigastrctomy, active oozing from anastomotic suture site, hemostasis achieved  . ESOPHAGOGASTRODUODENOSCOPY (EGD) WITH PROPOFOL N/A 05/23/2016   Dr. 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 ESOPHAGOGASTRODUODENOSCOPY (EGD) WITH PROPOFOL N/A 05/11/2017   Procedure: ESOPHAGOGASTRODUODENOSCOPY (EGD) WITH PROPOFOL;  Surgeon: Daneil Dolin, MD;  [facility];  Service: Endoscopy;  Laterality: N/A;  . HEMORRHOID SURGERY N/A 06/18/2017   Procedure: THREE COLUMN EXTENSIVE HEMORRHOIDECTOMY;  Surgeon: Virl Cagey, MD;  Location: [facility];  Service: General;  Laterality: N/A;  . INCISION AND DRAINAGE ABSCESS N/A 01/06/2017    Procedure: INCISION AND DRAINAGE ABDOMINAL WALL ABSCESS;  Surgeon: Aviva Signs, MD;  Location: [facility];  Service: General;  Laterality: N/A;  . POLYPECTOMY  12/24/2018   Procedure: POLYPECTOMY;  Surgeon: Daneil Dolin, MD;  [facility];  Service: Endoscopy;;  colon  . tendon repar Right    wrist  . WOUND EXPLORATION Right 06/24/2013   Procedure: exploration of traumatic wound right wrist;  Surgeon: Tennis Must, MD;  Location: [facility];  Service: Orthopedics;  Laterality: Right;     OB History    Gravida  4   Para  4   Term  4   Preterm      AB      Living  3     SAB      TAB      Ectopic      Multiple      Live Births              Family History  Problem Relation Age of Onset  . Brain cancer Son   . Schizophrenia Son   . Cancer Son        brain  . Lung cancer Father   . Cancer Father        mets  . Drug abuse Mother   . Breast cancer Maternal Aunt   . Bipolar disorder Maternal Aunt   . Drug abuse Maternal Aunt   . Colon cancer Maternal Grandmother        late 83s, early 31s  . Drug abuse Sister   . Drug abuse Brother   . Bipolar disorder Paternal Grandfather   . Bipolar disorder Cousin   . Liver disease Neg Hx     Social History   Tobacco Use  . Smoking status: Current Every Day Smoker    Packs/day: 0.50    Years: 35.00    Pack years: 17.50    Types: Cigarettes  . Smokeless tobacco: Never Used  Substance Use Topics  . Alcohol use: Not Currently  . Drug use: Not Currently    Types: Cocaine    Home Medications Prior to Admission medications   Medication Sig Start Date End Date Taking? Authorizing Provider  acetaminophen (TYLENOL) 500 MG tablet Take 2 tablets (1,000 mg total) by mouth 3 (three) times daily as needed for mild pain or moderate pain. 02/26/19   Enzo Bi, MD  amLODipine (NORVASC) 10 MG tablet Take 0.5 tablets (5 mg total) by mouth daily. 01/03/17   Doreatha Lew, MD  atenolol (TENORMIN) 50 MG tablet Take 50 mg  by mouth daily. 02/01/19   [provider]  cyclobenzaprine (FLEXERIL) 5 MG tablet Take 1 tablet (5 mg  total) by mouth 3 (three) times daily as needed for muscle spasms. 02/26/19   Enzo Bi, MD  dicyclomine (BENTYL) 10 MG capsule Take 1 capsule (10 mg total) by mouth 4 (four) times daily -  before meals and at bedtime. For diarrhea and abdominal cramps 12/09/18   Mahala Menghini, PA-C  escitalopram (LEXAPRO) 20 MG tablet Take 20 mg daily by mouth.    [provider]  folic acid (FOLVITE) 1 MG tablet Take 1 tablet (1 mg total) by mouth daily. 11/13/18   Roxan Hockey, MD  mupirocin cream (BACTROBAN) 2 % Apply topically 2 (two) times daily. 02/26/19   Enzo Bi, MD  omeprazole (PRILOSEC) 20 MG capsule Take 1 capsule (20 mg total) by mouth daily for 14 days. 01/27/19 02/23/19  McDonald, Mia A, PA-C  promethazine (PHENERGAN) 25 MG suppository Place 1 suppository (25 mg total) rectally every 6 (six) hours as needed for nausea or vomiting. 01/27/19   McDonald, Mia A, PA-C  promethazine (PHENERGAN) 25 MG tablet Take 25 mg by mouth every 6 (six) hours as needed for nausea or vomiting.    [provider]  thiamine 100 MG tablet Take 1 tablet (100 mg total) by mouth daily. 11/13/18   Roxan Hockey, MD  traMADol (ULTRAM) 50 MG tablet Take 1 tablet (50 mg total) by mouth every 12 (twelve) hours as needed for up to 15 doses. 03/10/19   Curatolo, Adam, DO    Allergies    Codeine, Morphine and related, and Reglan [metoclopramide]  Review of Systems   Review of Systems  All other systems reviewed and are negative.   Physical Exam Updated Vital Signs BP (!) 129/101 (BP Location: Left Arm)   Pulse 87   Temp 98.2 F (36.8 C) (Oral)   Resp 18   Ht 5\' 3"  (1.6 m)   Wt 60.3 kg   SpO2 96%   BMI 23.56 kg/m   Physical Exam Vitals and nursing note reviewed.   54 year old female, resting comfortably and in no acute distress. Vital signs are significant for elevated diastolic  blood pressure. Oxygen saturation is 96%, which is normal. Head is normocephalic and atraumatic. PERRLA, EOMI. Oropharynx is clear. Neck is nontender and supple without adenopathy or JVD. Back is nontender and there is no CVA tenderness. Lungs are clear without rales, wheezes, or rhonchi. Chest is nontender. Heart has regular rate and rhythm without murmur. Abdomen: Multiple surgical scars present over the mid abdominal area with 2 wounds with ongoing drainaqe.the abdomen is soft but tender diffusely.  There is no rebound or guarding.  There are no masses or hepatosplenomegaly and peristalsis is hypoactive. Extremities have no cyanosis or edema, full range of motion is present. Skin is warm and dry without rash. Neurologic: Mental status is normal, cranial nerves are intact, there are no motor or sensory deficits.  ED Results / Procedures / Treatments   Labs (all labs ordered are listed, but only abnormal results are displayed) Labs Reviewed  CBC WITH DIFFERENTIAL/PLATELET - Abnormal; Notable for the following components:      Result Value   HCT 34.7 (*)    Neutro Abs 1.1 (*)    All other components within normal limits  COMPREHENSIVE METABOLIC PANEL - Abnormal; Notable for the following components:   CO2 21 (*)    Glucose, Bld 103 (*)    Creatinine, Ser 0.43 (*)    Calcium 8.7 (*)    All other components within normal limits  URINALYSIS, ROUTINE W  REFLEX MICROSCOPIC - Abnormal; Notable for the following components:   Color, Urine STRAW (*)    Specific Gravity, Urine 1.002 (*)    All other components within normal limits  LIPASE, BLOOD   Radiology DG ABD ACUTE 2+V W 1V CHEST  Result Date: 05/02/2019 CLINICAL DATA:  Abdomen pain with vomiting EXAM: DG ABDOMEN ACUTE W/ 1V CHEST COMPARISON:  07/02/2017, CT 02/23/2019, chest x-ray 11/11/2018 FINDINGS: Single-view chest demonstrates possible subtle peripheral ground-glass opacity. Normal heart size. No pneumothorax. Supine and upright  views of the abdomen demonstrate no free air beneath the diaphragm. Postsurgical changes in the upper abdomen. Nonobstructed gas pattern. : 1. Nonobstructed gas pattern. 2. Possible subtle peripheral ground-glass opacities in the left mid to upper lung, question atypical or viral process. Electronically Signed   By: Donavan Foil M.D.   On: 05/02/2019 23:48    Procedures Procedures  Medications Ordered in ED Medications - No data to display  ED Course  I have reviewed the triage vital signs and the nursing notes.  Pertinent labs & imaging results that were available during my care of the patient were reviewed by me and considered in my medical decision making (see chart for details).  MDM Rules/Calculators/A&P Abdominal pain, vomiting, diarrhea and patient with chronic abdominal pain.  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  On review of old records, she has had multiple CT scans of the abdomen and pelvis including 3 within the past 30months.  Well bowel obstruction is a possibility, I do not necessarily think it is a high probability and will screen with plain abdominal films.  She will be given IV fluids, prochlorperazine, hydromorphone.  Doubt significant hematemesis.  Labs are unremarkable, hemoglobin is stable.  X-ray shows no evidence of obstruction.  She had little relief of symptoms with above-noted treatment.  She is given additional fluids, hydromorphone, haloperidol with little improvement in pain.  She is given a dose of ondansetron and, following this, she is sleeping comfortably.  She is discharged with prescription for ondansetron, advised to follow-up with PCP and with wound care clinic.  Final Clinical Impression(s) / ED Diagnoses Final diagnoses:  Chronic abdominal pain  Non-intractable vomiting with nausea, unspecified vomiting type    Rx / DC Orders ED Discharge Orders         Ordered    ondansetron (ZOFRAN) 4 MG tablet  Every 6  hours PRN     05/03/19 0000000           Delora Fuel, MD [phone]

## 2019-05-02 NOTE — ED Notes (Signed)
Patient transported to X-ray 

## 2019-05-02 NOTE — ED Triage Notes (Signed)
Pt reports nausea, vomiting and diarrhea starting yesterday. She states that she noted some bright red blood in her emesis today. Also endorses abdominal pain. Hx of lupus and chronic abdominal pain noted. A&Ox4.

## 2019-05-03 MED ORDER — ONDANSETRON HCL 4 MG PO TABS: 4.0000 mg | ORAL_TABLET | Freq: Four times a day (QID) | ORAL | 0 refills | Status: DC | PRN

## 2019-05-03 MED ORDER — SODIUM CHLORIDE 0.9 % IV BOLUS
1000.0000 mL | Freq: Once | INTRAVENOUS | Status: AC
  Administered 2019-05-03: 1000 mL via INTRAVENOUS

## 2019-05-03 MED ORDER — ONDANSETRON HCL 4 MG/2ML IJ SOLN
4.0000 mg | Freq: Once | INTRAMUSCULAR | Status: AC
  Administered 2019-05-03: 4 mg via INTRAVENOUS
  Filled 2019-05-03: qty 2

## 2019-05-03 MED ORDER — HYDROMORPHONE HCL 1 MG/ML IJ SOLN
0.5000 mg | Freq: Once | INTRAMUSCULAR | Status: AC
  Administered 2019-05-03: 0.5 mg via INTRAVENOUS
  Filled 2019-05-03: qty 1

## 2019-05-03 MED ORDER — HALOPERIDOL LACTATE 5 MG/ML IJ SOLN
5.0000 mg | Freq: Once | INTRAMUSCULAR | Status: AC
  Administered 2019-05-03: 5 mg via INTRAVENOUS
  Filled 2019-05-03: qty 1

## 2019-05-03 NOTE — Discharge Instructions (Signed)
Please follow up with your primary care provider, and the wound care clinic.  Return if symptoms are not being adequately controlled at home.

## 2019-05-08 ENCOUNTER — Emergency Department (HOSPITAL_COMMUNITY)
Admission: EM | Admit: 2019-05-08 | Discharge: 2019-05-09 | Disposition: A | Discharge status: Home / Self Care | Payer: Medicare Other | Attending: Emergency Medicine | Admitting: Emergency Medicine

## 2019-05-08 ENCOUNTER — Other Ambulatory Visit: Payer: Self-pay

## 2019-05-08 ENCOUNTER — Encounter (HOSPITAL_COMMUNITY): Payer: Self-pay

## 2019-05-08 DIAGNOSIS — Z79899 Other long term (current) drug therapy: Secondary | ICD-10-CM | POA: Diagnosis not present

## 2019-05-08 DIAGNOSIS — I69351 Hemiplegia and hemiparesis following cerebral infarction affecting right dominant side: Secondary | ICD-10-CM | POA: Diagnosis not present

## 2019-05-08 DIAGNOSIS — F17210 Nicotine dependence, cigarettes, uncomplicated: Secondary | ICD-10-CM | POA: Insufficient documentation

## 2019-05-08 DIAGNOSIS — Z20822 Contact with and (suspected) exposure to covid-19: Secondary | ICD-10-CM | POA: Insufficient documentation

## 2019-05-08 DIAGNOSIS — R112 Nausea with vomiting, unspecified: Secondary | ICD-10-CM | POA: Diagnosis not present

## 2019-05-08 DIAGNOSIS — R1110 Vomiting, unspecified: Secondary | ICD-10-CM | POA: Diagnosis present

## 2019-05-08 DIAGNOSIS — R197 Diarrhea, unspecified: Secondary | ICD-10-CM | POA: Insufficient documentation

## 2019-05-08 DIAGNOSIS — G8190 Hemiplegia, unspecified affecting unspecified side: Secondary | ICD-10-CM | POA: Diagnosis not present

## 2019-05-08 DIAGNOSIS — I10 Essential (primary) hypertension: Secondary | ICD-10-CM | POA: Diagnosis not present

## 2019-05-08 DIAGNOSIS — G8191 Hemiplegia, unspecified affecting right dominant side: Secondary | ICD-10-CM

## 2019-05-08 DIAGNOSIS — R519 Headache, unspecified: Secondary | ICD-10-CM | POA: Diagnosis not present

## 2019-05-08 DIAGNOSIS — R000 Tachycardia, unspecified: Secondary | ICD-10-CM | POA: Diagnosis not present

## 2019-05-08 MED ORDER — MORPHINE SULFATE (PF) 4 MG/ML IV SOLN
4.0000 mg | Freq: Once | INTRAVENOUS | Status: AC
  Administered 2019-05-09: 4 mg via INTRAVENOUS
  Filled 2019-05-08: qty 1

## 2019-05-08 MED ORDER — SODIUM CHLORIDE 0.9% FLUSH: 3.0000 mL | Freq: Once | INTRAVENOUS | Status: DC

## 2019-05-08 MED ORDER — ONDANSETRON HCL 4 MG/2ML IJ SOLN
4.0000 mg | Freq: Once | INTRAMUSCULAR | Status: AC
  Administered 2019-05-09: 4 mg via INTRAVENOUS
  Filled 2019-05-08: qty 2

## 2019-05-08 MED ORDER — SODIUM CHLORIDE 0.9 % IV BOLUS
1000.0000 mL | Freq: Once | INTRAVENOUS | Status: AC
  Administered 2019-05-09: 1000 mL via INTRAVENOUS

## 2019-05-08 NOTE — ED Provider Notes (Signed)
[facility] DEPT Provider Note   CSN: [id number] Arrival date & time: 05/08/19  2320     History Chief Complaint  Patient presents with  . Emesis    Angel Price is a 54 y.o. female.  The history is provided by the patient and medical records. No language interpreter was used.  Emesis    54 year old female with history of chronic abdominal pain, alcohol abuse, GERD, gastroparesis, gastritis, lupus, malingering, presenting complaining of abdominal pain and stroke symptoms.  Patient report around 8 PM last night she developed acute onset of severe throbbing headache.  Sleeping with sleep and this morning when she was awoke she endorsed having numbness and weakness throughout the right side of her body from her right shoulder down to her foot.  She endorsed decrease sensation decreased strength to the affected side.  She also endorsed face numbness and drooling.  Furthermore, she also endorsed chronic abdominal pain.  Pain is related to her chronic abdominal wound pain is diffuse, crampy with associate nausea vomiting diarrhea no report of any fever, diplopia, confusion, productive cough or recent COVID-19 contacts.  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  Stroke with right-sided weakness improved with PT but now worsened.  Patient admits to alcohol use but denies drug use.  Past Medical History:  Diagnosis Date  . Abscess    soft tissue  . Adrenal mass ([place])   . Alcohol abuse   . Anxiety   . Blood transfusion without reported diagnosis   . Chronic abdominal pain   . Chronic wound infection of abdomen   . Colon polyp    colonoscopy 04/2014  . Depression   . Diverticulosis    colonoscopy 04/2014 moderat pan colonic  . Drug-seeking behavior   . Gastritis    EGD 05/2014  . Gastroparesis Nov 2015  . GERD (gastroesophageal reflux disease)   . Hemorrhoid    internal large  . Hiatal hernia   . History of  Billroth II operation   . Hypertension   . Lung nodule    CT 02/2014 needs repeat 1 month  . Lung nodule < 6cm on CT 04/25/2014  . Lupus ([place])   . Malingering   . Nausea and vomiting    chronic, recurrent  . Pancreatitis   . Schatzki's ring    patent per EGD 04/2014  . Sickle cell trait ([place])   . Suicide attempt ([place])   . 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 Intractable vomiting with nausea 02/23/2019  . Abnormal CT scan, colon 12/09/2018  . Abnormal computed tomography of cecum and terminal ileum 12/09/2018  . Cocaine abuse ([place])   . Colitis 11/11/2018  . Hemorrhoidal skin tag   . SBO (small bowel obstruction) ([place])   . Non-intractable vomiting with nausea   . Folate deficiency 05/11/2017  . Abdominal wall fistula   . Cellulitis of abdominal wall 01/02/2017  . Acute left hemiparesis ([place]) 12/05/2016  . Malingering 12/05/2016  . Weakness of left lower extremity 12/02/2016  . CVA (cerebral vascular accident) ([place]) 11/23/2016  . Bright red rectal bleeding 08/22/2016  . Hypotension due to blood loss   . Acute GI bleeding 05/24/2016  . Dieulafoy lesion of duodenum   . History of Billroth II operation   . Gastrointestinal hemorrhage 05/22/2016  . Absolute anemia   . Acute pancreatitis 10/01/2015  .  Abnormal CT scan of lung 10/01/2015  . Alcohol intoxication ([place])   . Left-sided weakness   . Psychosomatic factor in physical condition   . Upper GI bleed   . Diverticulosis of colon with hemorrhage   . Chronic wound infection of abdomen 12/22/2014  . Thyroid disease 12/22/2014  . HTN (hypertension) 12/22/2014  . Ataxia 11/01/2014  . Hemorrhoids 04/26/2014  . Lung nodule 04/26/2014  . Diverticulosis   . Gastritis   . Hiatal hernia   . Schatzki's ring   . Acute blood loss anemia 04/25/2014  . Sinus tachycardia 04/25/2014  . Hypokalemia 04/25/2014  . Hematemesis with nausea 04/25/2014  . Lung nodule < 6cm  on CT 04/25/2014  . Intractable nausea and vomiting 04/24/2014  . Gastroparesis   . Abdominal pain 04/01/2014  . Chronic abdominal pain 01/21/2014  . Gastroenteritis 12/10/2013  . Chronic abdominal wound infection 08/16/2013  . MDD (major depressive disorder), recurrent episode, severe ([place]) 06/27/2013  . Wrist laceration 06/24/2013  . Diarrhea 05/07/2013  . Rectal bleeding 05/07/2013  . Abnormal LFTs 05/07/2013  . Adrenal mass, left ([place]) 05/07/2013  . Abdominal wall abscess at site of surgical wound 04/19/2013  . Abdominal wall abscess 04/18/2013  . Frequent headaches 03/30/2013  . Sleep difficulties 03/30/2013  . Essential hypertension, benign 03/30/2013  . History of cocaine abuse ([place]) 03/16/2013  . History of schizoaffective disorder 03/16/2013  . Bipolar disorder ([place]) 03/16/2013  . Personality disorder ([place]) 03/16/2013  . Tobacco abuse 03/16/2013  . Alcohol abuse 03/16/2013  . Palpitations 03/16/2013  . Poor vision 03/16/2013  . History of gastric bypass 03/16/2013  . Status post hysterectomy with oophorectomy 03/16/2013  . Hypothyroid 03/16/2013  . Lupus ([place]) 03/16/2013    Past Surgical History:  Procedure Laterality Date  . ABDOMINAL HYSTERECTOMY  2013   [place]  . ABDOMINAL SURGERY    . ADRENALECTOMY Right   . AGILE CAPSULE N/A 01/05/2015   Procedure: AGILE CAPSULE;  Surgeon: Daneil Dolin, MD;  [facility];  Service: Endoscopy;  Laterality: N/A;  0700  . Billroth II procedure      [place], first 2000, 2005/2006.  Marland Kitchen BIOPSY  05/20/2013   Procedure: BIOPSIES OF ASCENDING AND SIGMOID COLON;  Surgeon: Daneil Dolin, MD;  Location: [facility] ORS;  Service: Endoscopy;;  . BIOPSY  04/26/2014   Procedure: BIOPSIES;  Surgeon: Danie Binder, MD;  Location: [facility];  Service: Endoscopy;;  . BIOPSY  12/24/2018   Procedure: BIOPSY;  Surgeon: Daneil Dolin, MD;  Location[facility];  Service: Endoscopy;;  colon  . CHOLECYSTECTOMY    . COLONOSCOPY     in  [place]  . COLONOSCOPY WITH PROPOFOL N/A 05/20/2013   Dr.Rourk- inadequate prep, normal appearing rectum, grossly normal colon aside from pancolonic diverticula, normal terminal ileum bx= unremarkable colonic mucosa. Due for early interval 2016.   Marland Kitchen COLONOSCOPY WITH PROPOFOL N/A 04/26/2014   [id number] ileum/one colon polyp removed/moderate pan-colonic diverticulosis/large internal hemorrhoids  . COLONOSCOPY WITH PROPOFOL N/A 12/23/2014   Dr.Rourk- minimal internal hemorrhoids, pancolonic diverticulosis  . COLONOSCOPY WITH PROPOFOL N/A 08/23/2016   Procedure: COLONOSCOPY WITH PROPOFOL;  Surgeon: Danie Binder, MD;  [facility];  Service: Endoscopy;  Laterality: N/A;  . COLONOSCOPY WITH PROPOFOL N/A 12/24/2018   Pancolonic diverticulosis, normal TI, one 5 mm polyp in rectum (tubular adenoma). Somewhat friable hemorrhagic mucosa in area of IC valve/cecum, possibly related to scope trauma s/p biopsy.   . DEBRIDEMENT OF ABDOMINAL WALL ABSCESS N/A  02/08/2013   Procedure: DEBRIDEMENT OF ABDOMINAL WALL ABSCESS;  Surgeon: Jamesetta So, MD;  Location: [facility];  Service: General;  Laterality: N/A;  . ESOPHAGOGASTRODUODENOSCOPY (EGD) WITH PROPOFOL N/A 05/20/2013   Dr.Rourk- s/p prior gastric surgery with normal esophagus, residual gastric mucosa and patent efferent limb  . ESOPHAGOGASTRODUODENOSCOPY (EGD) WITH PROPOFOL N/A 02/03/2014   Dr. Gala Romney:  s/p hemigastrectomy with retained gastric contents. Residual gastric mucosa and efferent limb appeared normal otherwise. Query gastroparesis.   Marland Kitchen ESOPHAGOGASTRODUODENOSCOPY (EGD) WITH PROPOFOL N/A 04/26/2014   [id number] ring/small HH/mild non-erosive gasrtitis/normal anastomosis  . ESOPHAGOGASTRODUODENOSCOPY (EGD) WITH PROPOFOL N/A 12/23/2014   Dr.Rourk- s/p prior hemigastrctomy, active oozing from anastomotic suture site, hemostasis achieved  . ESOPHAGOGASTRODUODENOSCOPY (EGD) WITH PROPOFOL N/A 05/23/2016   Dr. 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 ESOPHAGOGASTRODUODENOSCOPY (EGD) WITH PROPOFOL N/A 05/11/2017   Procedure: ESOPHAGOGASTRODUODENOSCOPY (EGD) WITH PROPOFOL;  Surgeon: Daneil Dolin, MD;  [facility];  Service: Endoscopy;  Laterality: N/A;  . HEMORRHOID SURGERY N/A 06/18/2017   Procedure: THREE COLUMN EXTENSIVE HEMORRHOIDECTOMY;  Surgeon: Virl Cagey, MD;  Location: [facility];  Service: General;  Laterality: N/A;  . INCISION AND DRAINAGE ABSCESS N/A 01/06/2017   Procedure: INCISION AND DRAINAGE ABDOMINAL WALL ABSCESS;  Surgeon: Aviva Signs, MD;  Location: [facility];  Service: General;  Laterality: N/A;  . POLYPECTOMY  12/24/2018   Procedure: POLYPECTOMY;  Surgeon: Daneil Dolin, MD;  [facility];  Service: Endoscopy;;  colon  . tendon repar Right    wrist  . WOUND EXPLORATION Right 06/24/2013   Procedure: exploration of traumatic wound right wrist;  Surgeon: Tennis Must, MD;  Location: [facility];  Service: Orthopedics;  Laterality: Right;     OB History    Gravida  4   Para  4   Term  4   Preterm      AB      Living  3     SAB      TAB      Ectopic      Multiple      Live Births              Family History  Problem Relation Age of Onset  . Brain cancer Son   . Schizophrenia Son   . Cancer Son        brain  . Lung cancer Father   . Cancer Father        mets  . Drug abuse Mother   . Breast cancer Maternal Aunt   . Bipolar disorder Maternal Aunt   . Drug abuse Maternal Aunt   . Colon cancer Maternal Grandmother        late 23s, early 45s  . Drug abuse Sister   . Drug abuse Brother   . Bipolar disorder Paternal Grandfather   . Bipolar disorder Cousin   . Liver disease Neg Hx     Social History   Tobacco Use  . Smoking status: Current Every Day Smoker    Packs/day: 0.50    Years: 35.00    Pack years: 17.50    Types: Cigarettes  . Smokeless tobacco: Never Used   Substance Use Topics  . Alcohol use: Not Currently  . Drug use: Not Currently    Types: Cocaine    Home Medications Prior to Admission medications   Medication Sig Start Date End Date Taking?  Authorizing Provider  acetaminophen (TYLENOL) 500 MG tablet Take 2 tablets (1,000 mg total) by mouth 3 (three) times daily as needed for mild pain or moderate pain. 02/26/19   Enzo Bi, MD  amLODipine (NORVASC) 10 MG tablet Take 0.5 tablets (5 mg total) by mouth daily. 01/03/17   Doreatha Lew, MD  atenolol (TENORMIN) 50 MG tablet Take 50 mg by mouth daily. 02/01/19   [provider]  cyclobenzaprine (FLEXERIL) 5 MG tablet Take 1 tablet (5 mg total) by mouth 3 (three) times daily as needed for muscle spasms. 02/26/19   Enzo Bi, MD  dicyclomine (BENTYL) 10 MG capsule Take 1 capsule (10 mg total) by mouth 4 (four) times daily -  before meals and at bedtime. For diarrhea and abdominal cramps 12/09/18   Mahala Menghini, PA-C  escitalopram (LEXAPRO) 20 MG tablet Take 20 mg daily by mouth.    [provider]  folic acid (FOLVITE) 1 MG tablet Take 1 tablet (1 mg total) by mouth daily. 11/13/18   Roxan Hockey, MD  mupirocin cream (BACTROBAN) 2 % Apply topically 2 (two) times daily. 02/26/19   Enzo Bi, MD  omeprazole (PRILOSEC) 20 MG capsule Take 1 capsule (20 mg total) by mouth daily for 14 days. 01/27/19 02/23/19  McDonald, Mia A, PA-C  ondansetron (ZOFRAN) 4 MG tablet Take 1 tablet (4 mg total) by mouth every 6 (six) hours as needed for nausea. [id number]   Delora Fuel, MD  promethazine (PHENERGAN) 25 MG suppository Place 1 suppository (25 mg total) rectally every 6 (six) hours as needed for nausea or vomiting. 01/27/19   McDonald, Mia A, PA-C  promethazine (PHENERGAN) 25 MG tablet Take 25 mg by mouth every 6 (six) hours as needed for nausea or vomiting.    [provider]  thiamine 100 MG tablet Take 1 tablet (100 mg total) by mouth daily. 11/13/18   Roxan Hockey, MD   traMADol (ULTRAM) 50 MG tablet Take 1 tablet (50 mg total) by mouth every 12 (twelve) hours as needed for up to 15 doses. 03/10/19   Curatolo, Adam, DO    Allergies    Codeine, Morphine and related, and Reglan [metoclopramide]  Review of Systems   Review of Systems  Gastrointestinal: Positive for vomiting.  All other systems reviewed and are negative.   Physical Exam Updated Vital Signs BP 116/77   Pulse 76   Temp 97.9 F (36.6 C) (Oral)   Resp 15   Ht 5\' 3"  (1.6 m)   Wt 59 kg   SpO2 99%   BMI 23.03 kg/m   Physical Exam Vitals and nursing note reviewed.  Constitutional:      General: She is not in acute distress.    Appearance: She is well-developed.  HENT:     Head: Atraumatic.  Eyes:     Conjunctiva/sclera: Conjunctivae normal.  Cardiovascular:     Rate and Rhythm: Tachycardia present.     Pulses: Normal pulses.     Heart sounds: Normal heart sounds.  Pulmonary:     Effort: Pulmonary effort is normal.     Breath sounds: Normal breath sounds. No wheezing, rhonchi or rales.  Abdominal:     Palpations: Abdomen is soft.     Tenderness: There is abdominal tenderness (Diffuse abdominal tenderness.  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  Musculoskeletal:     Cervical back: Neck supple.  Skin:    Findings: No rash.  Neurological:     Mental Status: She is alert.  GCS: GCS eye subscore is 4. GCS verbal subscore is 5. GCS motor subscore is 6.     Cranial Nerves: Cranial nerve deficit present. No dysarthria or facial asymmetry.     Sensory: Sensory deficit present.     Comments: Neurologic exam:  Speech clear, pupils equal round reactive to light, extraocular movements intact  Normal peripheral visual fields Cranial nerves III through XII normal including no facial droop, subjective decreased sensation to R side of face. Follows commands, moves all extremities x3 (weakness to RUE/RLE) Sensation diminished to light touch to RUE/RLE R pronator  drift Gait not tested      ED Results / Procedures / Treatments   Labs (all labs ordered are listed, but only abnormal results are displayed) Labs Reviewed  COMPREHENSIVE METABOLIC PANEL - Abnormal; Notable for the following components:      Result Value   Potassium 5.7 (*)    CO2 19 (*)    Total Protein 8.3 (*)    AST 81 (*)    All other components within normal limits  URINALYSIS, ROUTINE W REFLEX MICROSCOPIC - Abnormal; Notable for the following components:   Color, Urine STRAW (*)    All other components within normal limits  I-STAT CHEM 8, ED - Abnormal; Notable for the following components:   Sodium 146 (*)    Chloride 113 (*)    All other components within normal limits  LIPASE, BLOOD  CBC  I-STAT BETA HCG BLOOD, ED ([facility], [facility], [facility] ONLY)    EKG EKG Interpretation  Date/Time:  Sunday May 09 2019 00:32:59 EST Ventricular Rate:  73 PR Interval:    QRS Duration: 86 QT Interval:  429 QTC Calculation: 473 R Axis:   62 Text Interpretation: Sinus rhythm Abnormal R-wave progression, early transition Baseline wander in lead(s) V1 No significant change since last tracing Confirmed by Ripley Fraise [phone]) on 05/09/2019 12:45:43 AM   Radiology No results found.  Procedures Procedures (including critical care time)  Medications Ordered in ED Medications  sodium chloride flush (NS) 0.9 % injection 3 mL (3 mLs Intravenous Not Given 05/09/19 0015)  sodium chloride 0.9 % bolus 1,000 mL (0 mLs Intravenous Stopped 05/09/19 0210)  ondansetron (ZOFRAN) injection 4 mg (4 mg Intravenous Given 05/09/19 0015)  morphine 4 MG/ML injection 4 mg (4 mg Intravenous Given 05/09/19 0015)  prochlorperazine (COMPAZINE) injection 10 mg (10 mg Intravenous Given 05/09/19 0023)  diphenhydrAMINE (BENADRYL) injection 25 mg (25 mg Intravenous Given 05/09/19 0025)  sodium chloride 0.9 % bolus 1,000 mL (1,000 mLs Intravenous New Bag/Given (Non-Interop) 05/09/19 0208)    ED Course  I have reviewed  the triage vital signs and the nursing notes.  Pertinent labs & imaging results that were available during my care of the patient were reviewed by me and considered in my medical decision making (see chart for details).    MDM Rules/Calculators/A&P                      BP 116/77   Pulse 76   Temp 97.9 F (36.6 C) (Oral)   Resp 15   Ht 5\' 3"  (1.6 m)   Wt 59 kg   SpO2 99%   BMI 23.03 kg/m   Final Clinical Impression(s) / ED Diagnoses Final diagnoses:  Right hemiparesis (HCC)  Nausea vomiting and diarrhea    Rx / DC Orders ED Discharge Orders    None     Patient with extensive medical history and reported history of prior  stroke affecting right side of body here with acute onset of headache as well as right hemiparesis, last known normal yesterday afternoon.  Furthermore, also complaining of diffuse abdominal pain with associate nausea vomiting diarrhea.  History of chronic abdominal pain and history of enterocutaneous fistulas. I did discussed care with Dr Christy Gentles who have evaluated pt.  Felt we should hold off on advance imaging at this time, and will provide treatment for suspect complicated migraine.  Medications ordered.  Will monitor closely.   1:50 AM Labs remarkable for elevated potassium of 5.7, no documented hemolysis.  EKG without concerning changes.  Due to persistent nausea vomiting and diarrhea anticipate this could be due to dehydration.  Will give additional IV fluid and will recheck potassium level.  4:36 AM After receiving 2 L of IVF, repeat labs showing normal K+.  Pt have not vomit in the ER.  However, still endorse R hemiparesis, and difficulty walking.  I discussed care with Dr. Christy Gentles, plan to obtain brain MRI for further evaluation.  Pt has had multiple brain MRIs in the past without acute findings.    6:06 AM Pt sign out to oncoming team who will f/u on MRI result.  If unremarkable and pt still experience R hemiparesis then will need neuro consult and  further work up.    Domenic Moras, PA-C 05/09/19 [id number]    Ripley Fraise, MD 05/09/19 [phone]

## 2019-05-08 NOTE — ED Triage Notes (Signed)
Pt reports vomiting and body aches that started today. She states that she has vomited all day. She endorses mid abdominal pain. She reports a hx of lupus and she has a hx of chronic abdominal pain. A&Ox4. Denies any sick contacts.

## 2019-05-09 ENCOUNTER — Emergency Department (HOSPITAL_COMMUNITY): Payer: Medicare Other

## 2019-05-09 DIAGNOSIS — G8191 Hemiplegia, unspecified affecting right dominant side: Secondary | ICD-10-CM | POA: Diagnosis not present

## 2019-05-09 DIAGNOSIS — R519 Headache, unspecified: Secondary | ICD-10-CM | POA: Diagnosis not present

## 2019-05-09 LAB — URINALYSIS, ROUTINE W REFLEX MICROSCOPIC
Bacteria, UA: NONE SEEN
Bilirubin Urine: NEGATIVE
Glucose, UA: NEGATIVE mg/dL
Hgb urine dipstick: NEGATIVE
Ketones, ur: NEGATIVE mg/dL
Leukocytes,Ua: NEGATIVE
Nitrite: NEGATIVE
Protein, ur: NEGATIVE mg/dL
Specific Gravity, Urine: 1.006 (ref 1.005–1.030)
pH: 5 (ref 5.0–8.0)

## 2019-05-09 LAB — BASIC METABOLIC PANEL
Anion gap: 10 (ref 5–15)
BUN: 10 mg/dL (ref 6–20)
CO2: 21 mmol/L — ABNORMAL LOW (ref 22–32)
Calcium: 8.4 mg/dL — ABNORMAL LOW (ref 8.9–10.3)
Chloride: 114 mmol/L — ABNORMAL HIGH (ref 98–111)
Creatinine, Ser: 0.58 mg/dL (ref 0.44–1.00)
GFR calc Af Amer: >60 mL/min (ref >60)
GFR calc non Af Amer: >60 mL/min (ref >60)
Glucose, Bld: 87 mg/dL (ref 70–99)
Potassium: 4.7 mmol/L (ref 3.5–5.1)
Sodium: 145 mmol/L (ref 135–145)

## 2019-05-09 LAB — COMPREHENSIVE METABOLIC PANEL
ALT: 42 U/L (ref 0–44)
AST: 81 U/L — ABNORMAL HIGH (ref 15–41)
Albumin: 3.9 g/dL (ref 3.5–5.0)
Alkaline Phosphatase: 78 U/L (ref 38–126)
Anion gap: 12 (ref 5–15)
BUN: 9 mg/dL (ref 6–20)
CO2: 19 mmol/L — ABNORMAL LOW (ref 22–32)
Calcium: 9.4 mg/dL (ref 8.9–10.3)
Chloride: 110 mmol/L (ref 98–111)
Creatinine, Ser: 0.61 mg/dL (ref 0.44–1.00)
GFR calc Af Amer: >60 mL/min (ref >60)
GFR calc non Af Amer: >60 mL/min (ref >60)
Glucose, Bld: 94 mg/dL (ref 70–99)
Potassium: 5.7 mmol/L — ABNORMAL HIGH (ref 3.5–5.1)
Sodium: 141 mmol/L (ref 135–145)
Total Bilirubin: 0.8 mg/dL (ref 0.3–1.2)
Total Protein: 8.3 g/dL — ABNORMAL HIGH (ref 6.5–8.1)

## 2019-05-09 LAB — I-STAT BETA HCG BLOOD, ED (MC, WL, AP ONLY): I-stat hCG, quantitative: <5 m[IU]/mL (ref <5)

## 2019-05-09 LAB — RAPID URINE DRUG SCREEN, HOSP PERFORMED
Amphetamines: NOT DETECTED
Barbiturates: NOT DETECTED
Benzodiazepines: NOT DETECTED
Cocaine: NOT DETECTED
Opiates: POSITIVE — AB
Tetrahydrocannabinol: NOT DETECTED

## 2019-05-09 LAB — I-STAT CHEM 8, ED
BUN: 8 mg/dL (ref 6–20)
Calcium, Ion: 1.16 mmol/L (ref 1.15–1.40)
Chloride: 113 mmol/L — ABNORMAL HIGH (ref 98–111)
Creatinine, Ser: 0.6 mg/dL (ref 0.44–1.00)
Glucose, Bld: 95 mg/dL (ref 70–99)
HCT: 36 % (ref 36.0–46.0)
Hemoglobin: 12.2 g/dL (ref 12.0–15.0)
Potassium: 4.1 mmol/L (ref 3.5–5.1)
Sodium: 146 mmol/L — ABNORMAL HIGH (ref 135–145)
TCO2: 22 mmol/L (ref 22–32)

## 2019-05-09 LAB — CBC
HCT: 38.8 % (ref 36.0–46.0)
Hemoglobin: 13.5 g/dL (ref 12.0–15.0)
MCH: 28.6 pg (ref 26.0–34.0)
MCHC: 34.8 g/dL (ref 30.0–36.0)
MCV: 82.2 fL (ref 80.0–100.0)
Platelets: 373 10*3/uL (ref 150–400)
RBC: 4.72 MIL/uL (ref 3.87–5.11)
RDW: 14.7 % (ref 11.5–15.5)
WBC: 5.5 10*3/uL (ref 4.0–10.5)
nRBC: 0 % (ref 0.0–0.2)

## 2019-05-09 LAB — ETHANOL: Alcohol, Ethyl (B): <10 mg/dL (ref <10)

## 2019-05-09 LAB — SARS CORONAVIRUS 2 (TAT 6-24 HRS): SARS Coronavirus 2: NEGATIVE

## 2019-05-09 LAB — LIPASE, BLOOD: Lipase: 21 U/L (ref 11–51)

## 2019-05-09 MED ORDER — PROMETHAZINE HCL 25 MG/ML IJ SOLN
12.5000 mg | Freq: Once | INTRAMUSCULAR | Status: AC
  Administered 2019-05-09: 08:00:00 12.5 mg via INTRAVENOUS
  Filled 2019-05-09: qty 1

## 2019-05-09 MED ORDER — PROCHLORPERAZINE EDISYLATE 10 MG/2ML IJ SOLN
10.0000 mg | Freq: Once | INTRAMUSCULAR | Status: AC
  Administered 2019-05-09: 10 mg via INTRAVENOUS
  Filled 2019-05-09: qty 2

## 2019-05-09 MED ORDER — ESCITALOPRAM OXALATE 10 MG PO TABS
20.0000 mg | ORAL_TABLET | Freq: Every day | ORAL | Status: DC
  Administered 2019-05-09: 20 mg via ORAL
  Filled 2019-05-09: qty 2

## 2019-05-09 MED ORDER — ONDANSETRON 4 MG PO TBDP: 4.0000 mg | ORAL_TABLET | Freq: Three times a day (TID) | ORAL | 0 refills | Status: DC | PRN

## 2019-05-09 MED ORDER — KETOROLAC TROMETHAMINE 15 MG/ML IJ SOLN
15.0000 mg | Freq: Once | INTRAMUSCULAR | Status: AC
  Administered 2019-05-09: 15 mg via INTRAVENOUS
  Filled 2019-05-09: qty 1

## 2019-05-09 MED ORDER — FENTANYL CITRATE (PF) 100 MCG/2ML IJ SOLN
50.0000 ug | Freq: Once | INTRAMUSCULAR | Status: AC
  Administered 2019-05-09: 07:00:00 50 ug via INTRAVENOUS
  Filled 2019-05-09: qty 2

## 2019-05-09 MED ORDER — AMLODIPINE BESYLATE 5 MG PO TABS: 5.0000 mg | ORAL_TABLET | Freq: Every day | ORAL | Status: DC

## 2019-05-09 MED ORDER — ONDANSETRON 4 MG PO TBDP
4.0000 mg | ORAL_TABLET | Freq: Once | ORAL | Status: AC
  Administered 2019-05-09: 4 mg via ORAL
  Filled 2019-05-09: qty 1

## 2019-05-09 MED ORDER — DIPHENHYDRAMINE HCL 50 MG/ML IJ SOLN
25.0000 mg | Freq: Once | INTRAMUSCULAR | Status: AC
  Administered 2019-05-09: 25 mg via INTRAVENOUS
  Filled 2019-05-09: qty 1

## 2019-05-09 MED ORDER — LORAZEPAM 2 MG/ML IJ SOLN
1.0000 mg | Freq: Once | INTRAMUSCULAR | Status: AC
  Administered 2019-05-09: 11:00:00 1 mg via INTRAVENOUS
  Filled 2019-05-09: qty 1

## 2019-05-09 MED ORDER — SODIUM CHLORIDE 0.9 % IV BOLUS
1000.0000 mL | Freq: Once | INTRAVENOUS | Status: AC
  Administered 2019-05-09: 02:00:00 1000 mL via INTRAVENOUS

## 2019-05-09 MED ORDER — LORAZEPAM 2 MG/ML IJ SOLN: 1.0000 mg | Freq: Once | INTRAMUSCULAR | Status: DC

## 2019-05-09 MED ORDER — ATENOLOL 50 MG PO TABS: 50.0000 mg | ORAL_TABLET | Freq: Every day | ORAL | Status: DC

## 2019-05-09 NOTE — Discharge Instructions (Addendum)
Follow-up with your primary care doctor for further evaluation. You Zofran as needed for nausea or vomiting. You should be contacted by the home health agency to assist you at home. Return to the emergency room with any new, worsening, concerning symptoms.

## 2019-05-09 NOTE — ED Notes (Signed)
Pt lying in bed watching t.v. Full monitor placed. Warm blanket given. Will continue to monitor

## 2019-05-09 NOTE — Consult Note (Signed)
Neurology Consultation Reason for Consult: Right-sided weakness Referring Physician: Joy, S  CC: Right-sided weakness  History is obtained from: Patient  HPI: Angel Price is a 54 y.o. female with a history of a previous episode of weakness that was MRI negative.  At the time Dr. 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  Today, she states that she woke up with right-sided weakness.  Though she reports previous stroke, I think she is referring to the episode that she had previously with negative MRI.  She also has a documented history of malingering   tpa given?: no, not a stroke    ROS: A 14 point ROS was performed and is negative except as noted in the HPI.   Past Medical History:  Diagnosis Date  . Abscess    soft tissue  . Adrenal mass ([place])   . Alcohol abuse   . Anxiety   . Blood transfusion without reported diagnosis   . Chronic abdominal pain   . Chronic wound infection of abdomen   . Colon polyp    colonoscopy 04/2014  . Depression   . Diverticulosis    colonoscopy 04/2014 moderat pan colonic  . Drug-seeking behavior   . Gastritis    EGD 05/2014  . Gastroparesis Nov 2015  . GERD (gastroesophageal reflux disease)   . Hemorrhoid    internal large  . Hiatal hernia   . History of Billroth II operation   . Hypertension   . Lung nodule    CT 02/2014 needs repeat 1 month  . Lung nodule < 6cm on CT 04/25/2014  . Lupus ([place])   . Malingering   . Nausea and vomiting    chronic, recurrent  . Pancreatitis   . Schatzki's ring    patent per EGD 04/2014  . Sickle cell trait ([place])   . Suicide attempt ([place])   . Thyroid disease 2000   overactive, radiation     Family History  Problem Relation Age of Onset  . Brain cancer Son   . Schizophrenia Son   . Cancer Son        brain  . Lung cancer Father   . Cancer Father        mets  . Drug abuse Mother   . Breast cancer Maternal Aunt   . Bipolar disorder Maternal Aunt   .  Drug abuse Maternal Aunt   . Colon cancer Maternal Grandmother        late 21s, early 50s  . Drug abuse Sister   . Drug abuse Brother   . Bipolar disorder Paternal Grandfather   . Bipolar disorder Cousin   . Liver disease Neg Hx      Social History:  reports that she has been smoking cigarettes. She has a 17.50 pack-year smoking history. She has never used smokeless tobacco. She reports previous alcohol use. She reports previous drug use. Drug: Cocaine.   Exam: Current vital signs: BP (!) 114/98   Pulse 76   Temp 97.9 F (36.6 C) (Oral)   Resp 18   Ht 5\' 3"  (1.6 m)   Wt 59 kg   SpO2 98%   BMI 23.03 kg/m  Vital signs in last 24 hours: Temp:  [97.9 F (36.6 C)] 97.9 F (36.6 C) (02/20 2327) Pulse Rate:  [68-121] 76 (02/21 1645) Resp:  [14-25] 18 (02/21 1545) BP: (81-153)/(60-119) 114/98 (02/21 1645) SpO2:  [95 %-100 %] 98 % (02/21 1645) Weight:  [59 kg] 59 kg (  02/20 2327)   Physical Exam  Constitutional: Appears well-developed and well-nourished.  Psych: Affect appropriate to situation Eyes: No scleral injection HENT: No OP obstrucion MSK: no joint deformities.  Cardiovascular: Normal rate and regular rhythm.  Respiratory: Effort normal, non-labored breathing GI: Soft.  No distension. There is no tenderness.  Skin: WDI  Neuro: Mental Status: Patient is awake, alert, oriented to person, place, month, year, and situation. Patient is able to give a clear and coherent history. No signs of aphasia or neglect Cranial Nerves: II: Visual Fields are full. Pupils are equal, round, and reactive to light.   III,IV, VI: EOMI without ptosis or diploplia.  V: Facial sensation is diminished on the right including splitting midline to vibration VII: Facial movement is inconsistent, sometimes displaying weakness and sometimes not, when she tries to puff her cheeks out she intermittently holds it tilt and with the doubt. VIII: hearing is intact to voice X: Uvula elevates  symmetrically XI: Shoulder shrug is symmetric. XII: tongue is midline without atrophy or fasciculations.  Motor: She has a right hemiplegia with no movement, however when I tell her "I will get you started on finger-nose-finger, all you have to do is keep going" she performs finger-nose-finger without ataxia including antigravity movement. Sensory: Sensation is diminished on the right Deep Tendon Reflexes: 2+ and symmetric in the biceps and patellae.  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  No further work-up from a neurological perspective is indicated.  Recommendations: 1) no further neurological work-up 2) if she does not improve, consider psychiatric consultation.   Roland Rack, MD [facility] [phone]  If 7pm- 7am, please page neurology on call as listed in [place].

## 2019-05-09 NOTE — Evaluation (Signed)
Physical Therapy Evaluation Patient Details Name: Angel Price MRN: [id number] DOB: Dec 27, 1965 Today's Date: 05/09/2019   History of Present Illness  Angel Price is a 54yo female seen in ED today with c/o R sided weakness, facial numbess.  history of a previous episode of weakness that was MRI negative, malingering, GERD, gastroparesis, lupus. MRI negative  Clinical Impression  Pt admitted as above and presenting with functional mobility limitations 2* fluctuating R side weakness and mild balance deficits.  Pt should progress to dc home with assist of family.    Follow Up Recommendations Home health PT    Equipment Recommendations  Rolling walker with 5" wheels    Recommendations for Other Services       Precautions / Restrictions Precautions Precautions: Fall Restrictions Weight Bearing Restrictions: No      Mobility  Bed Mobility Overal bed mobility: Modified Independent             General bed mobility comments: no physical assist in/out of bed  Transfers Overall transfer level: Needs assistance Equipment used: Rolling walker (2 wheeled) Transfers: Sit to/from Stand Sit to Stand: Min guard;Min assist         General transfer comment: steady assist only with cues for use of UEs to self assist  Ambulation/Gait Ambulation/Gait assistance: Min guard Gait Distance (Feet): 150 Feet Assistive device: Rolling walker (2 wheeled) Gait Pattern/deviations: Step-through pattern;Decreased step length - right;Decreased step length - left;Shuffle Gait velocity: mod pace   General Gait Details: min cues for posture and position from [employer]            Wheelchair Mobility    Modified Rankin (Stroke Patients Only)       Balance Overall balance assessment: Needs assistance Sitting-balance support: No upper extremity supported;Feet supported Sitting balance-Leahy Scale: Good     Standing balance support: Bilateral upper extremity  supported Standing balance-Leahy Scale: Fair                               Pertinent Vitals/Pain Pain Assessment: No/denies pain    Home Living Family/patient expects to be discharged to:: Private residence Living Arrangements: Spouse/significant other Available Help at Discharge: Family Type of Home: Other(Comment) Home Access: Stairs to enter Entrance Stairs-Rails: None Entrance Stairs-Number of Steps: 2 Home Layout: Two level Home Equipment: Cane - single point Additional Comments: independent    Prior Function Level of Independence: Independent               Hand Dominance        Extremity/Trunk Assessment   Upper Extremity Assessment Upper Extremity Assessment: RUE deficits/detail RUE Deficits / Details: min strength on testing but noted pt using R UE for walker management and assisting self in/out of bed    Lower Extremity Assessment Lower Extremity Assessment: RLE deficits/detail RLE Deficits / Details: min strength on testing but with functional activity pt able to bear full wt, ambulate without dragging toes, and bring LE in and out of bed unassisted    Cervical / Trunk Assessment Cervical / Trunk Assessment: Normal  Communication   Communication: No difficulties  Cognition Arousal/Alertness: Awake/alert Behavior During Therapy: Flat affect Overall Cognitive Status: Within Functional Limits for tasks assessed  General Comments      Exercises     Assessment/Plan    PT Assessment Patient needs continued PT services  PT Problem List Decreased strength;Decreased activity tolerance;Decreased balance;Decreased knowledge of use of DME       PT Treatment Interventions DME instruction;Gait training;Stair training;Functional mobility training;Therapeutic activities;Therapeutic exercise;Patient/family education;Balance training    PT Goals (Current goals can be found in the Care Plan  section)  Acute Rehab PT Goals Patient Stated Goal: Regain IND PT Goal Formulation: With patient Time For Goal Achievement: 05/23/19 Potential to Achieve Goals: Good    Frequency Min 3X/week   Barriers to discharge        Co-evaluation               AM-PAC PT "6 Clicks" Mobility  Outcome Measure Help needed turning from your back to your side while in a flat bed without using bedrails?: None Help needed moving from lying on your back to sitting on the side of a flat bed without using bedrails?: None Help needed moving to and from a bed to a chair (including a wheelchair)?: A Little Help needed standing up from a chair using your arms (e.g., wheelchair or bedside chair)?: A Little Help needed to walk in hospital room?: A Little Help needed climbing 3-5 steps with a railing? : A Little 6 Click Score: 20    End of Session Equipment Utilized During Treatment: Gait belt Activity Tolerance: Patient tolerated treatment well Patient left: in bed;with call bell/phone within reach Nurse Communication: Mobility status PT Visit Diagnosis: Difficulty in walking, not elsewhere classified (R26.2)    Time: [id number] PT Time Calculation (min) (ACUTE ONLY): 17 min   Charges:   PT Evaluation $PT Eval Low Complexity: [place] Pager [phone] Office [phone]   Charistopher Rumble 05/09/2019, 5:34 PM

## 2019-05-09 NOTE — Progress Notes (Addendum)
CSW spoke to the pt and asked if what pt's goal is.  Pt stated, "to find out what's wrong".  When asked to specify pt stated, she does "want to know why it's acting like that".  CSW asked if pt was desiring to go into a skilled nursing facility to get rehab in order to get better and pt stated, "Can't they do that in my home?"  CSW affirmed that [facility] can be ordered and that a RN CM can assist with [place] in the home post D/C and that this normally entail 2-3 visits a week for PT/OT.  Pt stated she was agreeable to this and agreeable to speaking to the RN CM to get [facility] set up.  Per the pt she has used [facility] in the past, liked them and would like to get them again.  CSW stated RN CM will set up [facility] and pt can expect to hear from [facility] in 24-48 hours to schedule an assessment.  Pt voiced understanding.  Pt was appreciative and thanked the CSW.  RN CM updated and agreeable to initiating [place] with [facility].  Please reconsult if future social work needs arise.  CSW signing off, as social work intervention is no longer needed.  Alphonse Guild. Cloyde Oregel, LCSW, LCAS, CSI Transitions of Care Clinical Social Worker Care Coordination Department Ph: [phone]

## 2019-05-09 NOTE — Progress Notes (Signed)
CSW spoke to the EDP who states pt arrived with complaints of approx 24 hours of right arm and leg weakness.  Per EPD, pt is cleared of any symptoms of present stroke, per MRI results and it is not known what has caused the pt to believe she cannot move or arms or leg on the right side.  Per notes, pt states she was not able to move her right leg, but that when distracted the pt was able to move her it (see below):  "Exam : awake/alert, no distress, watching TV.  When distracted, pt is able to move her right LE.  When asked, pt can not move her right arm/leg".  CSW will continue to follow for D/C needs.  Alphonse Guild. Shaday Rayborn, LCSW, LCAS, CSI Transitions of Care Clinical Social Worker Care Coordination Department Ph: [phone]

## 2019-05-09 NOTE — Care Management (Signed)
ED CM received call from [facility] ED CSW concerning patient needing [facility] arranged, patient has selected [facility] has used them in the past. CM called and faxed orders to [facility] [phone] via [facility].

## 2019-05-09 NOTE — ED Notes (Signed)
Pt lying in bed. Assisted with urinal. Will continue to monitor

## 2019-05-09 NOTE — ED Provider Notes (Signed)
Patient seen/examined in the Emergency Department in conjunction with [facility] Provider Rona Ravens Patient reports HA and right UE/LE weakness that started nearly 24 hrs ago Exam : awake/alert, no distress, watching TV.  When distracted, pt is able to move her right LE.  When asked, pt can not move her right arm/leg Plan: will treat HA and reassess.  Pt has had extensive neuroimaging (5 MRI brain since 2016, most recent negative)    Ripley Fraise, MD 05/09/19 [phone]

## 2019-05-09 NOTE — ED Provider Notes (Signed)
Patient signed out to me by S. Joy, PA-C.  Please see previous notes for further history.  In brief, patient presenting for evaluation of right arm and leg numbness and weakness.  Patient with weakness on exam ?effort.  MRI negative.  Patient was evaluated by Dr. Leonel Ramsay with neurology, this was felt to be psychogenic in nature.  Attempted Toradol for possible silent migraine, this did not improve symptoms.  Patient is unable to ambulate, and thus unable to be discharged from the ED.  There are no acute or emergent findings on labs or imaging that would require hospitalization or further medical management.  Pending PT/OT consult and possible SNF placement.  Social work discussed with patient, who is agreeable to home health.  Home health orders placed.  Case management assisting.  Pending PT eval and ultimate plan for discharge.  Evaluated patient, recommends discharge with walker.  At this time, patient appears safe for discharge.  Return precautions given.  Patient states she understands and agrees to plan.     Franchot Heidelberg, PA-C 05/09/19 1849    Maudie Flakes, MD 05/10/19 [phone]

## 2019-05-09 NOTE — ED Notes (Signed)
Pt lying in bed, eye's closed, chest rising and falling. Full monitor on. Will continue to monitor.  

## 2019-05-09 NOTE — ED Notes (Signed)
MRI called 

## 2019-05-09 NOTE — ED Provider Notes (Signed)
Angel Price is a 54 y.o. female, presenting to the ED primarily complaining of right-sided weakness.  She states she was last normal around 3 AM on May 08, 2019.  She does mention chronic abdominal pain, however, states this is not her primary concern.  She denies trauma, acute neck/back pain, fever, difficulty speaking, difficulty breathing, difficulty swallowing, facial droop.   HPI from Domenic Moras, PA-C: " 54 year old female with history of chronic abdominal pain, alcohol abuse, GERD, gastroparesis, gastritis, lupus, malingering, presenting complaining of abdominal pain and stroke symptoms.  Patient report around 8 PM last night she developed acute onset of severe throbbing headache.  Sleeping with sleep and this morning when she was awoke she endorsed having numbness and weakness throughout the right side of her body from her right shoulder down to her foot.  She endorsed decrease sensation decreased strength to the affected side.  She also endorsed face numbness and drooling.  Furthermore, she also endorsed chronic abdominal pain.  Pain is related to her chronic abdominal wound pain is diffuse, crampy with associate nausea vomiting diarrhea no report of any fever, diplopia, confusion, productive cough or recent COVID-19 contacts.  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  Stroke with right-sided weakness improved with PT but now worsened.  Patient admits to alcohol use but denies drug use."  Past Medical History:  Diagnosis Date  . Abscess    soft tissue  . Adrenal mass ([place])   . Alcohol abuse   . Anxiety   . Blood transfusion without reported diagnosis   . Chronic abdominal pain   . Chronic wound infection of abdomen   . Colon polyp    colonoscopy 04/2014  . Depression   . Diverticulosis    colonoscopy 04/2014 moderat pan colonic  . Drug-seeking behavior   . Gastritis    EGD 05/2014  . Gastroparesis Nov 2015  . GERD (gastroesophageal  reflux disease)   . Hemorrhoid    internal large  . Hiatal hernia   . History of Billroth II operation   . Hypertension   . Lung nodule    CT 02/2014 needs repeat 1 month  . Lung nodule < 6cm on CT 04/25/2014  . Lupus ([place])   . Malingering   . Nausea and vomiting    chronic, recurrent  . Pancreatitis   . Schatzki's ring    patent per EGD 04/2014  . Sickle cell trait ([place])   . Suicide attempt ([place])   . 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 ABDOMINAL HYSTERECTOMY  2013   [place]  . ABDOMINAL SURGERY    . ADRENALECTOMY Right   . AGILE CAPSULE N/A 01/05/2015   Procedure: AGILE CAPSULE;  Surgeon: Daneil Dolin, MD;  [facility];  Service: Endoscopy;  Laterality: N/A;  0700  . Billroth II procedure      [place], first 2000, 2005/2006.  Marland Kitchen BIOPSY  05/20/2013   Procedure: BIOPSIES OF ASCENDING AND SIGMOID COLON;  Surgeon: Daneil Dolin, MD;  Location: [facility] ORS;  Service: Endoscopy;;  . BIOPSY  04/26/2014   Procedure: BIOPSIES;  Surgeon: Danie Binder, MD;  Location: [facility] OR[facility];  Service: Endoscopy;;  . BIOPSY  12/24/2018   Procedure: BIOPSY;  Surgeon: Daneil Dolin, MD;  [facility];  Service: Endoscopy;;  colon  . CHOLECYSTECTOMY    . COLONOSCOPY  in [place]  . COLONOSCOPY WITH PROPOFOL N/A 05/20/2013   Dr.Rourk- inadequate prep, normal appearing rectum, grossly normal colon aside from pancolonic diverticula, normal terminal ileum bx= unremarkable colonic mucosa. Due for early interval 2016.   Marland Kitchen COLONOSCOPY WITH PROPOFOL N/A 04/26/2014   [id number] ileum/one colon polyp removed/moderate pan-colonic diverticulosis/large internal hemorrhoids  . COLONOSCOPY WITH PROPOFOL N/A 12/23/2014   Dr.Rourk- minimal internal hemorrhoids, pancolonic diverticulosis  . COLONOSCOPY WITH PROPOFOL N/A 08/23/2016   Procedure: COLONOSCOPY WITH PROPOFOL;  Surgeon: Danie Binder, MD;  [facility];  Service:  Endoscopy;  Laterality: N/A;  . COLONOSCOPY WITH PROPOFOL N/A 12/24/2018   Pancolonic diverticulosis, normal TI, one 5 mm polyp in rectum (tubular adenoma). Somewhat friable hemorrhagic mucosa in area of IC valve/cecum, possibly related to scope trauma s/p biopsy.   . DEBRIDEMENT OF ABDOMINAL WALL ABSCESS N/A 02/08/2013   Procedure: DEBRIDEMENT OF ABDOMINAL WALL ABSCESS;  Surgeon: Jamesetta So, MD;  Location: [facility];  Service: General;  Laterality: N/A;  . ESOPHAGOGASTRODUODENOSCOPY (EGD) WITH PROPOFOL N/A 05/20/2013   Dr.Rourk- s/p prior gastric surgery with normal esophagus, residual gastric mucosa and patent efferent limb  . ESOPHAGOGASTRODUODENOSCOPY (EGD) WITH PROPOFOL N/A 02/03/2014   Dr. Gala Romney:  s/p hemigastrectomy with retained gastric contents. Residual gastric mucosa and efferent limb appeared normal otherwise. Query gastroparesis.   Marland Kitchen ESOPHAGOGASTRODUODENOSCOPY (EGD) WITH PROPOFOL N/A 04/26/2014   [id number] ring/small HH/mild non-erosive gasrtitis/normal anastomosis  . ESOPHAGOGASTRODUODENOSCOPY (EGD) WITH PROPOFOL N/A 12/23/2014   Dr.Rourk- s/p prior hemigastrctomy, active oozing from anastomotic suture site, hemostasis achieved  . ESOPHAGOGASTRODUODENOSCOPY (EGD) WITH PROPOFOL N/A 05/23/2016   Dr. 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 ESOPHAGOGASTRODUODENOSCOPY (EGD) WITH PROPOFOL N/A 05/11/2017   Procedure: ESOPHAGOGASTRODUODENOSCOPY (EGD) WITH PROPOFOL;  Surgeon: Daneil Dolin, MD;  [facility];  Service: Endoscopy;  Laterality: N/A;  . HEMORRHOID SURGERY N/A 06/18/2017   Procedure: THREE COLUMN EXTENSIVE HEMORRHOIDECTOMY;  Surgeon: Virl Cagey, MD;  Location: [facility];  Service: General;  Laterality: N/A;  . INCISION AND DRAINAGE ABSCESS N/A 01/06/2017   Procedure: INCISION AND DRAINAGE ABDOMINAL WALL ABSCESS;  Surgeon: Aviva Signs, MD;  Location: [facility];  Service:  General;  Laterality: N/A;  . POLYPECTOMY  12/24/2018   Procedure: POLYPECTOMY;  Surgeon: Daneil Dolin, MD;  [facility];  Service: Endoscopy;;  colon  . tendon repar Right    wrist  . WOUND EXPLORATION Right 06/24/2013   Procedure: exploration of traumatic wound right wrist;  Surgeon: Tennis Must, MD;  Location: [place];  Service: Orthopedics;  Laterality: Right;     Physical Exam  BP 110/86   Pulse 92   Temp 97.9 F (36.6 C) (Oral)   Resp 17   Ht 5\' 3"  (1.6 m)   Wt 59 kg   SpO2 98%   BMI 23.03 kg/m   Physical Exam Vitals and nursing note reviewed.  Constitutional:      General: She is not in acute distress.    Appearance: She is well-developed. She is not diaphoretic.  HENT:     Head: Normocephalic and atraumatic.     Mouth/Throat:     Mouth: Mucous membranes are moist.     Pharynx: Oropharynx is clear.  Eyes:     Conjunctiva/sclera: Conjunctivae normal.  Cardiovascular:     Rate and Rhythm: Normal rate and regular rhythm.     Pulses: Normal pulses.  Radial pulses are 2+ on the right side and 2+ on the left side.       Posterior tibial pulses are 2+ on the right side and 2+ on the left side.     Heart sounds: Normal heart sounds.     Comments: Tactile temperature in the extremities appropriate and equal bilaterally. Pulmonary:     Effort: Pulmonary effort is normal. No respiratory distress.     Breath sounds: Normal breath sounds.  Abdominal:     Palpations: Abdomen is soft.     Tenderness: There is generalized abdominal tenderness. There is no guarding.     Comments: Seemingly mild generalized abdominal tenderness the patient states is no different than her typical abdominal pain and tenderness. Patient has two, chronic appearing wounds, consistent with laparoscopic surgical wounds to the abdomen near the umbilicus.  She states these wounds have been present since her surgery 2 years ago and have never fully healed.  She states they leak thick  fluid, but there have been no recent changes.  Musculoskeletal:     Cervical back: Neck supple.     Right lower leg: No edema.     Left lower leg: No edema.  Lymphadenopathy:     Cervical: No cervical adenopathy.  Skin:    General: Skin is warm and dry.  Neurological:     Mental Status: She is alert and oriented to person, place, and time.     Deep Tendon Reflexes:     Reflex Scores:      Patellar reflexes are 2+ on the right side and 2+ on the left side.    Comments: Sensation to light touch grossly intact in the extremities, however, patient endorses a lessening in her sensation in an uneven distribution of the right upper and lower extremity.  Intact in the left upper and lower extremity.  Psychiatric:        Mood and Affect: Mood and affect normal.        Speech: Speech normal.        Behavior: Behavior normal.      ED Course/Procedures    Procedures  Abnormal Labs Reviewed  COMPREHENSIVE METABOLIC PANEL - Abnormal; Notable for the following components:      Result Value   Potassium 5.7 (*)    CO2 19 (*)    Total Protein 8.3 (*)    AST 81 (*)    All other components within normal limits  URINALYSIS, ROUTINE W REFLEX MICROSCOPIC - Abnormal; Notable for the following components:   Color, Urine STRAW (*)    All other components within normal limits  RAPID URINE DRUG SCREEN, HOSP PERFORMED - Abnormal; Notable for the following components:   Opiates POSITIVE (*)    All other components within normal limits  I-STAT CHEM 8, ED - Abnormal; Notable for the following components:   Sodium 146 (*)    Chloride 113 (*)    All other components within normal limits    MR BRAIN WO CONTRAST  Result Date: 05/09/2019 CLINICAL DATA:  Headache and right upper extremity/lower extremity weakness for 24 hours EXAM: MRI HEAD WITHOUT CONTRAST TECHNIQUE: Multiplanar, multiecho pulse sequences of the brain and surrounding structures were obtained without intravenous contrast. COMPARISON:   12/20/2016 FINDINGS: Brain: No acute infarction, hemorrhage, hydrocephalus, extra-axial collection or mass lesion. Two tiny FLAIR hyperintensities in the cerebral white matter, allowable for age and nonspecific. At least 1 of these was seen on prior. Brain volume is normal. Vascular: Normal flow voids  Skull and upper cervical spine: Normal marrow signal Sinuses/Orbits: Chronic nasal septum defect. IMPRESSION: No emergent finding or significant change from 2018. Electronically Signed   By: Monte Fantasia M.D.   On: 05/09/2019 12:25   DG ABD ACUTE 2+V W 1V CHEST  Result Date: 05/02/2019 CLINICAL DATA:  Abdomen pain with vomiting EXAM: DG ABDOMEN ACUTE W/ 1V CHEST COMPARISON:  07/02/2017, CT 02/23/2019, chest x-ray 11/11/2018 FINDINGS: Single-view chest demonstrates possible subtle peripheral ground-glass opacity. Normal heart size. No pneumothorax. Supine and upright views of the abdomen demonstrate no free air beneath the diaphragm. Postsurgical changes in the upper abdomen. Nonobstructed gas pattern. : 1. Nonobstructed gas pattern. 2. Possible subtle peripheral ground-glass opacities in the left mid to upper lung, question atypical or viral process. Electronically Signed   By: Donavan Foil M.D.   On: 05/02/2019 23:48    EKG Interpretation  Date/Time:  Sunday May 09 2019 00:32:59 EST Ventricular Rate:  73 PR Interval:    QRS Duration: 86 QT Interval:  429 QTC Calculation: 473 R Axis:   62 Text Interpretation: Sinus rhythm Abnormal R-wave progression, early transition Baseline wander in lead(s) V1 No significant change since last tracing Confirmed by Ripley Fraise [phone]) on 05/09/2019 12:45:43 AM       MDM   Clinical Course as of May 08 1520  Sun May 09, 2019  0955 Delay in MRI. MRI tech listed on the schedule would not answer call. AC was able to get ahold of her and states she is not the one on call. Correct person was able to be contacted and is on their way in.   [SJ]  [facility] with Dr. Leonel Ramsay. States he will come see the patient.   [SJ]  44 Dr. Leonel Ramsay evaluated the patient. States he suspects her symptoms are psychogenic, however, recommends attempting treatment for possible complicated migraine, such as Toradol, since patient already received Compazine, Benadryl, and IV fluids.   [SJ]  [id number] on patient after administration of Toradol.  She states she has had no change in any of her symptoms.   [SJ]  1509 Spoke with Dr. Lonny Prude, hospitalist. He states patient does not have admittable criteria at this time, especially since neurology has no further work-up for the patient.  Recommends PT/OT evaluation for possible placement from the ED.   [SJ]    Clinical Course User Index [SJ] Lorayne Bender, PA-C    Patient care handoff report received from Domenic Moras, PA-C. Plan: MRI brain pending.  Reevaluate patient.  If MRI normal and patient still unable to demonstrate strength and safety, neurology evaluation.  Patient presents primarily complaining of right-sided upper and lower extremity weakness.  This was reproducible on multiple exams. No acute abnormalities on MRI.  Patient evaluated and cleared by neurology.   Findings and plan of care discussed with Daleen Bo, MD.   End of shift patient care handoff report given to [facility], PA-C. Plan: PT/OT evaluation pending.  Social work/case management involvement for possible placement of the patient.   Vitals:   05/09/19 0515 05/09/19 0530 05/09/19 0545 05/09/19 0600  BP: 96/77 99/74 93/78  110/86  Pulse: 79 78 78 92  Resp: 15 15 15 17   Temp:      TempSrc:      SpO2: 100% 99% 98% 98%  Weight:      Height:        Vitals:   05/09/19 0945 05/09/19 1000 05/09/19 1015 05/09/19 1310  BP: 106/89 123/86  Marland Kitchen)  118/96  Pulse: 80 80 86 88  Resp:    20  Temp:      TempSrc:      SpO2: 99% 100% 99% 98%  Weight:      Height:            Lorayne Bender, PA-C 05/09/19 1524    Daleen Bo, MD 05/11/19 1435

## 2019-05-09 NOTE — ED Notes (Signed)
Attempted ambulation unsuccessful. Female urinal given. Full monitor on. Will continue to monitor

## 2019-05-12 ENCOUNTER — Encounter (HOSPITAL_COMMUNITY): Payer: Self-pay

## 2019-05-12 ENCOUNTER — Emergency Department (HOSPITAL_COMMUNITY)
Admission: EM | Admit: 2019-05-12 | Discharge: 2019-05-13 | Disposition: A | Discharge status: Home / Self Care | Payer: Medicare Other | Attending: Emergency Medicine | Admitting: Emergency Medicine

## 2019-05-12 ENCOUNTER — Other Ambulatory Visit: Payer: Self-pay

## 2019-05-12 DIAGNOSIS — E039 Hypothyroidism, unspecified: Secondary | ICD-10-CM | POA: Insufficient documentation

## 2019-05-12 DIAGNOSIS — X58XXXS Exposure to other specified factors, sequela: Secondary | ICD-10-CM | POA: Insufficient documentation

## 2019-05-12 DIAGNOSIS — Z79899 Other long term (current) drug therapy: Secondary | ICD-10-CM | POA: Insufficient documentation

## 2019-05-12 DIAGNOSIS — S30811A Abrasion of abdominal wall, initial encounter: Secondary | ICD-10-CM | POA: Diagnosis not present

## 2019-05-12 DIAGNOSIS — R109 Unspecified abdominal pain: Secondary | ICD-10-CM | POA: Diagnosis present

## 2019-05-12 DIAGNOSIS — F17210 Nicotine dependence, cigarettes, uncomplicated: Secondary | ICD-10-CM | POA: Insufficient documentation

## 2019-05-12 DIAGNOSIS — G8929 Other chronic pain: Secondary | ICD-10-CM | POA: Insufficient documentation

## 2019-05-12 DIAGNOSIS — S31109S Unspecified open wound of abdominal wall, unspecified quadrant without penetration into peritoneal cavity, sequela: Secondary | ICD-10-CM | POA: Diagnosis not present

## 2019-05-12 DIAGNOSIS — I10 Essential (primary) hypertension: Secondary | ICD-10-CM | POA: Diagnosis not present

## 2019-05-12 MED ORDER — DIPHENHYDRAMINE HCL 50 MG/ML IJ SOLN
12.5000 mg | Freq: Once | INTRAMUSCULAR | Status: AC
  Administered 2019-05-13: 12.5 mg via INTRAVENOUS
  Filled 2019-05-12: qty 1

## 2019-05-12 MED ORDER — DROPERIDOL 2.5 MG/ML IJ SOLN
1.2500 mg | Freq: Once | INTRAMUSCULAR | Status: AC
  Administered 2019-05-13: 1.25 mg via INTRAVENOUS
  Filled 2019-05-12: qty 2

## 2019-05-12 NOTE — ED Provider Notes (Signed)
[facility] DEPT Provider Note   CSN: [id number] Arrival date & time: 05/12/19  2209     History Chief Complaint  Patient presents with  . Abdominal Pain    Angel Price is a 54 y.o. female.  With a past medical history of chronic abdominal pain, chronic wound infection of the abdomen, alcohol abuse, gastroparesis, drug-seeking behavior, she presents the emergency department with chief complaint of abdominal wall abscess.  Patient states that she has been vomiting for the past few days.  She appears to have a history of chronic vomiting.  She has been eating Zofran and Phenergan without relief of her symptoms.  She states that she noticed that when she awoke this morning there was blood and pus on her sheets and that she had pus draining from her abdominal wall.  She has a history of previous open laparotomy and has chronic abscesses of the abdominal wall.  Patient states that she is supposed to follow-up with the wound care center.  She denies any fever, chills.  She has had some diarrhea and vomiting.  She denies melena, hematochezia or hematemesis.  HPI     Past Medical History:  Diagnosis Date  . Abscess    soft tissue  . Adrenal mass ([place])   . Alcohol abuse   . Anxiety   . Blood transfusion without reported diagnosis   . Chronic abdominal pain   . Chronic wound infection of abdomen   . Colon polyp    colonoscopy 04/2014  . Depression   . Diverticulosis    colonoscopy 04/2014 moderat pan colonic  . Drug-seeking behavior   . Gastritis    EGD 05/2014  . Gastroparesis Nov 2015  . GERD (gastroesophageal reflux disease)   . Hemorrhoid    internal large  . Hiatal hernia   . History of Billroth II operation   . Hypertension   . Lung nodule    CT 02/2014 needs repeat 1 month  . Lung nodule < 6cm on CT 04/25/2014  . Lupus ([place])   . Malingering   . Nausea and vomiting    chronic, recurrent  . Pancreatitis   . Schatzki's ring    patent per  EGD 04/2014  . Sickle cell trait ([place])   . Suicide attempt ([place])   . 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 Intractable vomiting with nausea 02/23/2019  . Abnormal CT scan, colon 12/09/2018  . Abnormal computed tomography of cecum and terminal ileum 12/09/2018  . Cocaine abuse ([place])   . Colitis 11/11/2018  . Hemorrhoidal skin tag   . SBO (small bowel obstruction) ([place])   . Non-intractable vomiting with nausea   . Folate deficiency 05/11/2017  . Abdominal wall fistula   . Cellulitis of abdominal wall 01/02/2017  . Acute left hemiparesis ([place]) 12/05/2016  . Malingering 12/05/2016  . Weakness of left lower extremity 12/02/2016  . CVA (cerebral vascular accident) ([place]) 11/23/2016  . Bright red rectal bleeding 08/22/2016  . Hypotension due to blood loss   . Acute GI bleeding 05/24/2016  . Dieulafoy lesion of duodenum   . History of Billroth II operation   . Gastrointestinal hemorrhage 05/22/2016  . Absolute anemia   . Acute pancreatitis 10/01/2015  . Abnormal CT scan of lung 10/01/2015  . Alcohol intoxication ([place])   . Left-sided weakness   . Psychosomatic factor in physical condition   . Upper GI  bleed   . Diverticulosis of colon with hemorrhage   . Chronic wound infection of abdomen 12/22/2014  . Thyroid disease 12/22/2014  . HTN (hypertension) 12/22/2014  . Ataxia 11/01/2014  . Hemorrhoids 04/26/2014  . Lung nodule 04/26/2014  . Diverticulosis   . Gastritis   . Hiatal hernia   . Schatzki's ring   . Acute blood loss anemia 04/25/2014  . Sinus tachycardia 04/25/2014  . Hypokalemia 04/25/2014  . Hematemesis with nausea 04/25/2014  . Lung nodule < 6cm on CT 04/25/2014  . Intractable nausea and vomiting 04/24/2014  . Gastroparesis   . Abdominal pain 04/01/2014  . Chronic abdominal pain 01/21/2014  . Gastroenteritis 12/10/2013  . Chronic abdominal wound infection 08/16/2013  . MDD (major depressive  disorder), recurrent episode, severe ([place]) 06/27/2013  . Wrist laceration 06/24/2013  . Diarrhea 05/07/2013  . Rectal bleeding 05/07/2013  . Abnormal LFTs 05/07/2013  . Adrenal mass, left ([place]) 05/07/2013  . Abdominal wall abscess at site of surgical wound 04/19/2013  . Abdominal wall abscess 04/18/2013  . Frequent headaches 03/30/2013  . Sleep difficulties 03/30/2013  . Essential hypertension, benign 03/30/2013  . History of cocaine abuse ([place]) 03/16/2013  . History of schizoaffective disorder 03/16/2013  . Bipolar disorder ([place]) 03/16/2013  . Personality disorder ([place]) 03/16/2013  . Tobacco abuse 03/16/2013  . Alcohol abuse 03/16/2013  . Palpitations 03/16/2013  . Poor vision 03/16/2013  . History of gastric bypass 03/16/2013  . Status post hysterectomy with oophorectomy 03/16/2013  . Hypothyroid 03/16/2013  . Lupus ([place]) 03/16/2013    Past Surgical History:  Procedure Laterality Date  . ABDOMINAL HYSTERECTOMY  2013   [place]  . ABDOMINAL SURGERY    . ADRENALECTOMY Right   . AGILE CAPSULE N/A 01/05/2015   Procedure: AGILE CAPSULE;  Surgeon: Daneil Dolin, MD;  Location[facility];  Service: Endoscopy;  Laterality: N/A;  0700  . Billroth II procedure      [place], first 2000, 2005/2006.  Marland Kitchen BIOPSY  05/20/2013   Procedure: BIOPSIES OF ASCENDING AND SIGMOID COLON;  Surgeon: Daneil Dolin, MD;  Location: [facility];  Service: Endoscopy;;  . BIOPSY  04/26/2014   Procedure: BIOPSIES;  Surgeon: Danie Binder, MD;  Location: [facility];  Service: Endoscopy;;  . BIOPSY  12/24/2018   Procedure: BIOPSY;  Surgeon: Daneil Dolin, MD;  Location[facility];  Service: Endoscopy;;  colon  . CHOLECYSTECTOMY    . COLONOSCOPY     in [place]  . COLONOSCOPY WITH PROPOFOL N/A 05/20/2013   Dr.Rourk- inadequate prep, normal appearing rectum, grossly normal colon aside from pancolonic diverticula, normal terminal ileum bx= unremarkable colonic mucosa. Due for early interval 2016.   Marland Kitchen  COLONOSCOPY WITH PROPOFOL N/A 04/26/2014   [id number] ileum/one colon polyp removed/moderate pan-colonic diverticulosis/large internal hemorrhoids  . COLONOSCOPY WITH PROPOFOL N/A 12/23/2014   Dr.Rourk- minimal internal hemorrhoids, pancolonic diverticulosis  . COLONOSCOPY WITH PROPOFOL N/A 08/23/2016   Procedure: COLONOSCOPY WITH PROPOFOL;  Surgeon: Danie Binder, MD;  [facility];  Service: Endoscopy;  Laterality: N/A;  . COLONOSCOPY WITH PROPOFOL N/A 12/24/2018   Pancolonic diverticulosis, normal TI, one 5 mm polyp in rectum (tubular adenoma). Somewhat friable hemorrhagic mucosa in area of IC valve/cecum, possibly related to scope trauma s/p biopsy.   . DEBRIDEMENT OF ABDOMINAL WALL ABSCESS N/A 02/08/2013   Procedure: DEBRIDEMENT OF ABDOMINAL WALL ABSCESS;  Surgeon: Jamesetta So, MD;  Location: [facility];  Service: General;  Laterality: N/A;  . ESOPHAGOGASTRODUODENOSCOPY (EGD)  WITH PROPOFOL N/A 05/20/2013   Dr.Rourk- s/p prior gastric surgery with normal esophagus, residual gastric mucosa and patent efferent limb  . ESOPHAGOGASTRODUODENOSCOPY (EGD) WITH PROPOFOL N/A 02/03/2014   Dr. Gala Romney:  s/p hemigastrectomy with retained gastric contents. Residual gastric mucosa and efferent limb appeared normal otherwise. Query gastroparesis.   Marland Kitchen ESOPHAGOGASTRODUODENOSCOPY (EGD) WITH PROPOFOL N/A 04/26/2014   [id number] ring/small HH/mild non-erosive gasrtitis/normal anastomosis  . ESOPHAGOGASTRODUODENOSCOPY (EGD) WITH PROPOFOL N/A 12/23/2014   Dr.Rourk- s/p prior hemigastrctomy, active oozing from anastomotic suture site, hemostasis achieved  . ESOPHAGOGASTRODUODENOSCOPY (EGD) WITH PROPOFOL N/A 05/23/2016   Dr. 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 ESOPHAGOGASTRODUODENOSCOPY (EGD) WITH PROPOFOL N/A 05/11/2017   Procedure: ESOPHAGOGASTRODUODENOSCOPY (EGD) WITH PROPOFOL;  Surgeon: Daneil Dolin, MD;   [facility];  Service: Endoscopy;  Laterality: N/A;  . HEMORRHOID SURGERY N/A 06/18/2017   Procedure: THREE COLUMN EXTENSIVE HEMORRHOIDECTOMY;  Surgeon: Virl Cagey, MD;  Location: [facility];  Service: General;  Laterality: N/A;  . INCISION AND DRAINAGE ABSCESS N/A 01/06/2017   Procedure: INCISION AND DRAINAGE ABDOMINAL WALL ABSCESS;  Surgeon: Aviva Signs, MD;  Location: [facility];  Service: General;  Laterality: N/A;  . POLYPECTOMY  12/24/2018   Procedure: POLYPECTOMY;  Surgeon: Daneil Dolin, MD;  [facility];  Service: Endoscopy;;  colon  . tendon repar Right    wrist  . WOUND EXPLORATION Right 06/24/2013   Procedure: exploration of traumatic wound right wrist;  Surgeon: Tennis Must, MD;  Location: [facility];  Service: Orthopedics;  Laterality: Right;     OB History    Gravida  4   Para  4   Term  4   Preterm      AB      Living  3     SAB      TAB      Ectopic      Multiple      Live Births              Family History  Problem Relation Age of Onset  . Brain cancer Son   . Schizophrenia Son   . Cancer Son        brain  . Lung cancer Father   . Cancer Father        mets  . Drug abuse Mother   . Breast cancer Maternal Aunt   . Bipolar disorder Maternal Aunt   . Drug abuse Maternal Aunt   . Colon cancer Maternal Grandmother        late 42s, early 26s  . Drug abuse Sister   . Drug abuse Brother   . Bipolar disorder Paternal Grandfather   . Bipolar disorder Cousin   . Liver disease Neg Hx     Social History   Tobacco Use  . Smoking status: Current Every Day Smoker    Packs/day: 0.50    Years: 35.00    Pack years: 17.50    Types: Cigarettes  . Smokeless tobacco: Never Used  Substance Use Topics  . Alcohol use: Not Currently  . Drug use: Not Currently    Types: Cocaine    Home Medications Prior to Admission medications   Medication Sig Start Date End Date Taking? Authorizing Provider  acetaminophen (TYLENOL) 500 MG  tablet Take 2 tablets (1,000 mg total) by mouth 3 (three) times daily as needed for mild pain or moderate pain. Patient  not taking: Reported on 05/09/2019 02/26/19   Enzo Bi, MD  amLODipine (NORVASC) 10 MG tablet Take 0.5 tablets (5 mg total) by mouth daily. Patient taking differently: Take 10 mg by mouth daily.  01/03/17   Doreatha Lew, MD  atenolol (TENORMIN) 50 MG tablet Take 50 mg by mouth daily. 02/01/19   [provider]  cyclobenzaprine (FLEXERIL) 5 MG tablet Take 1 tablet (5 mg total) by mouth 3 (three) times daily as needed for muscle spasms. Patient not taking: Reported on 05/09/2019 02/26/19   Enzo Bi, MD  dicyclomine (BENTYL) 10 MG capsule Take 1 capsule (10 mg total) by mouth 4 (four) times daily -  before meals and at bedtime. For diarrhea and abdominal cramps Patient not taking: Reported on 05/09/2019 12/09/18   Mahala Menghini, PA-C  escitalopram (LEXAPRO) 20 MG tablet Take 20 mg daily by mouth.    [provider]  folic acid (FOLVITE) 1 MG tablet Take 1 tablet (1 mg total) by mouth daily. Patient not taking: Reported on 05/09/2019 11/13/18   Roxan Hockey, MD  ibuprofen (ADVIL) 800 MG tablet Take 800 mg by mouth every 8 (eight) hours as needed for moderate pain.  04/15/19   [provider]  mupirocin cream (BACTROBAN) 2 % Apply topically 2 (two) times daily. Patient not taking: Reported on 05/09/2019 02/26/19   Enzo Bi, MD  omeprazole (PRILOSEC) 20 MG capsule Take 1 capsule (20 mg total) by mouth daily for 14 days. 01/27/19 02/23/19  McDonald, Mia A, PA-C  ondansetron (ZOFRAN ODT) 4 MG disintegrating tablet Take 1 tablet (4 mg total) by mouth every 8 (eight) hours as needed for nausea or vomiting. 05/09/19   Caccavale, Sophia, PA-C  ondansetron (ZOFRAN) 4 MG tablet Take 1 tablet (4 mg total) by mouth every 6 (six) hours as needed for nausea. [id number]   Delora Fuel, MD  pantoprazole (PROTONIX) 40 MG tablet Take 40 mg by mouth daily. 04/29/19    [provider]  promethazine (PHENERGAN) 25 MG suppository Place 1 suppository (25 mg total) rectally every 6 (six) hours as needed for nausea or vomiting. Patient not taking: Reported on 05/09/2019 01/27/19   McDonald, Maree Erie A, PA-C  thiamine 100 MG tablet Take 1 tablet (100 mg total) by mouth daily. Patient not taking: Reported on 05/09/2019 11/13/18   Roxan Hockey, MD  traMADol (ULTRAM) 50 MG tablet Take 1 tablet (50 mg total) by mouth every 12 (twelve) hours as needed for up to 15 doses. Patient not taking: Reported on 05/09/2019 03/10/19   Lennice Sites, DO  vitamin B-12 (CYANOCOBALAMIN) 1000 MCG tablet Take 1,000 mcg by mouth daily.    [provider]    Allergies    Codeine, Morphine and related, and Reglan [metoclopramide]  Review of Systems   Review of Systems Ten systems reviewed and are negative for acute change, except as noted in the HPI.   Physical Exam Updated Vital Signs BP 118/84 (BP Location: Right Arm)   Pulse 80   Temp 98.1 F (36.7 C) (Oral)   Resp 14   Ht 5\' 3"  (1.6 m)   Wt 59 kg   SpO2 99%   BMI 23.03 kg/m   Physical Exam Vitals and nursing note reviewed.  Constitutional:      General: She is not in acute distress.    Appearance: She is well-developed. She is not diaphoretic.  HENT:     Head: Normocephalic and atraumatic.  Eyes:     General: No scleral icterus.  Conjunctiva/sclera: Conjunctivae normal.  Cardiovascular:     Rate and Rhythm: Normal rate and regular rhythm.     Heart sounds: Normal heart sounds. No murmur. No friction rub. No gallop.   Pulmonary:     Effort: Pulmonary effort is normal. No respiratory distress.     Breath sounds: Normal breath sounds.  Abdominal:     General: Bowel sounds are normal. There is no distension.     Palpations: Abdomen is soft. There is no mass.     Tenderness: There is no abdominal tenderness. There is no guarding.    Musculoskeletal:     Cervical back: Normal range of motion.   Skin:    General: Skin is warm and dry.  Neurological:     Mental Status: She is alert and oriented to person, place, and time.  Psychiatric:        Behavior: Behavior normal.     ED Results / Procedures / Treatments   Labs (all labs ordered are listed, but only abnormal results are displayed) Labs Reviewed - No data to display  EKG None  Radiology No results found.  Procedures Procedures (including critical care time)  Medications Ordered in ED Medications - No data to display  ED Course  I have reviewed the triage vital signs and the nursing notes.  Pertinent labs & imaging results that were available during my care of the patient were reviewed by me and considered in my medical decision making (see chart for details).    MDM Rules/Calculators/A&P                      Patient here with c/o abdominal abscess. This is a chronic issue. She has multipe draining tracts. No evidence of cellulitis.  I have reviewed the patient's labs which show no leukocytosis. Lipase WNL. cmp with mildly elevated glucose. No evidence of dehydration. no active vomiting in the ER. Patient appears appropriate for discharge at this time.  Final Clinical Impression(s) / ED Diagnoses Final diagnoses:  None    Rx / DC Orders ED Discharge Orders    None       Margarita Mail, PA-C [id number]    Delora Fuel, MD [id number] [phone]

## 2019-05-12 NOTE — ED Triage Notes (Signed)
Pt reports L sided abdominal pain today. Pt has a hx of lupus and chronic abdominal pain. Endorses vomiting today. A&Ox4.

## 2019-05-13 DIAGNOSIS — K2100 Gastro-esophageal reflux disease with esophagitis, without bleeding: Secondary | ICD-10-CM | POA: Diagnosis not present

## 2019-05-13 DIAGNOSIS — M67441 Ganglion, right hand: Secondary | ICD-10-CM | POA: Diagnosis not present

## 2019-05-13 LAB — COMPREHENSIVE METABOLIC PANEL
ALT: 22 U/L (ref 0–44)
AST: 26 U/L (ref 15–41)
Albumin: 3.8 g/dL (ref 3.5–5.0)
Alkaline Phosphatase: 74 U/L (ref 38–126)
Anion gap: 10 (ref 5–15)
BUN: 10 mg/dL (ref 6–20)
CO2: 24 mmol/L (ref 22–32)
Calcium: 8.9 mg/dL (ref 8.9–10.3)
Chloride: 101 mmol/L (ref 98–111)
Creatinine, Ser: 0.64 mg/dL (ref 0.44–1.00)
GFR calc Af Amer: >60 mL/min (ref >60)
GFR calc non Af Amer: >60 mL/min (ref >60)
Glucose, Bld: 101 mg/dL — ABNORMAL HIGH (ref 70–99)
Potassium: 4.4 mmol/L (ref 3.5–5.1)
Sodium: 135 mmol/L (ref 135–145)
Total Bilirubin: 0.5 mg/dL (ref 0.3–1.2)
Total Protein: 7.8 g/dL (ref 6.5–8.1)

## 2019-05-13 LAB — CBC WITH DIFFERENTIAL/PLATELET
Abs Immature Granulocytes: 0.02 10*3/uL (ref 0.00–0.07)
Basophils Absolute: 0 10*3/uL (ref 0.0–0.1)
Basophils Relative: 1 %
Eosinophils Absolute: 0.2 10*3/uL (ref 0.0–0.5)
Eosinophils Relative: 3 %
HCT: 34.5 % — ABNORMAL LOW (ref 36.0–46.0)
Hemoglobin: 12.3 g/dL (ref 12.0–15.0)
Immature Granulocytes: 0 %
Lymphocytes Relative: 53 %
Lymphs Abs: 3 10*3/uL (ref 0.7–4.0)
MCH: 29.2 pg (ref 26.0–34.0)
MCHC: 35.7 g/dL (ref 30.0–36.0)
MCV: 81.9 fL (ref 80.0–100.0)
Monocytes Absolute: 0.4 10*3/uL (ref 0.1–1.0)
Monocytes Relative: 8 %
Neutro Abs: 2 10*3/uL (ref 1.7–7.7)
Neutrophils Relative %: 35 %
Platelets: 329 10*3/uL (ref 150–400)
RBC: 4.21 MIL/uL (ref 3.87–5.11)
RDW: 14.5 % (ref 11.5–15.5)
WBC: 5.6 10*3/uL (ref 4.0–10.5)
nRBC: 0.7 % — ABNORMAL HIGH (ref 0.0–0.2)

## 2019-05-13 LAB — LIPASE, BLOOD: Lipase: 18 U/L (ref 11–51)

## 2019-05-13 NOTE — Discharge Instructions (Addendum)
Get help right away if you: Have severe pain. See red streaks on your skin spreading away from the abscess. 

## 2019-05-16 ENCOUNTER — Emergency Department (HOSPITAL_COMMUNITY): Payer: Medicare Other

## 2019-05-16 ENCOUNTER — Inpatient Hospital Stay (HOSPITAL_COMMUNITY)
Admission: EM | Admit: 2019-05-16 | Discharge: 2019-05-18 | DRG: 056 | Disposition: A | Discharge status: Home / Self Care | Payer: Medicare Other | Attending: Internal Medicine | Admitting: Internal Medicine

## 2019-05-16 DIAGNOSIS — F419 Anxiety disorder, unspecified: Secondary | ICD-10-CM | POA: Diagnosis present

## 2019-05-16 DIAGNOSIS — F17210 Nicotine dependence, cigarettes, uncomplicated: Secondary | ICD-10-CM | POA: Diagnosis present

## 2019-05-16 DIAGNOSIS — Z79891 Long term (current) use of opiate analgesic: Secondary | ICD-10-CM

## 2019-05-16 DIAGNOSIS — G4700 Insomnia, unspecified: Secondary | ICD-10-CM | POA: Diagnosis present

## 2019-05-16 DIAGNOSIS — Z720 Tobacco use: Secondary | ICD-10-CM | POA: Diagnosis not present

## 2019-05-16 DIAGNOSIS — R7989 Other specified abnormal findings of blood chemistry: Secondary | ICD-10-CM | POA: Diagnosis not present

## 2019-05-16 DIAGNOSIS — Z888 Allergy status to other drugs, medicaments and biological substances status: Secondary | ICD-10-CM | POA: Diagnosis not present

## 2019-05-16 DIAGNOSIS — Z90710 Acquired absence of both cervix and uterus: Secondary | ICD-10-CM | POA: Diagnosis not present

## 2019-05-16 DIAGNOSIS — D573 Sickle-cell trait: Secondary | ICD-10-CM | POA: Diagnosis present

## 2019-05-16 DIAGNOSIS — F17200 Nicotine dependence, unspecified, uncomplicated: Secondary | ICD-10-CM | POA: Diagnosis present

## 2019-05-16 DIAGNOSIS — K219 Gastro-esophageal reflux disease without esophagitis: Secondary | ICD-10-CM | POA: Diagnosis present

## 2019-05-16 DIAGNOSIS — Z800 Family history of malignant neoplasm of digestive organs: Secondary | ICD-10-CM

## 2019-05-16 DIAGNOSIS — Z8673 Personal history of transient ischemic attack (TIA), and cerebral infarction without residual deficits: Secondary | ICD-10-CM

## 2019-05-16 DIAGNOSIS — E872 Acidosis, unspecified: Secondary | ICD-10-CM

## 2019-05-16 DIAGNOSIS — Z803 Family history of malignant neoplasm of breast: Secondary | ICD-10-CM | POA: Diagnosis not present

## 2019-05-16 DIAGNOSIS — Z801 Family history of malignant neoplasm of trachea, bronchus and lung: Secondary | ICD-10-CM | POA: Diagnosis not present

## 2019-05-16 DIAGNOSIS — R4781 Slurred speech: Secondary | ICD-10-CM | POA: Diagnosis not present

## 2019-05-16 DIAGNOSIS — R200 Anesthesia of skin: Secondary | ICD-10-CM | POA: Diagnosis present

## 2019-05-16 DIAGNOSIS — F10129 Alcohol abuse with intoxication, unspecified: Secondary | ICD-10-CM | POA: Diagnosis present

## 2019-05-16 DIAGNOSIS — G8191 Hemiplegia, unspecified affecting right dominant side: Principal | ICD-10-CM | POA: Diagnosis present

## 2019-05-16 DIAGNOSIS — R404 Transient alteration of awareness: Secondary | ICD-10-CM | POA: Diagnosis not present

## 2019-05-16 DIAGNOSIS — Z808 Family history of malignant neoplasm of other organs or systems: Secondary | ICD-10-CM | POA: Diagnosis not present

## 2019-05-16 DIAGNOSIS — Z813 Family history of other psychoactive substance abuse and dependence: Secondary | ICD-10-CM

## 2019-05-16 DIAGNOSIS — I10 Essential (primary) hypertension: Secondary | ICD-10-CM | POA: Diagnosis present

## 2019-05-16 DIAGNOSIS — Z91011 Allergy to milk products: Secondary | ICD-10-CM

## 2019-05-16 DIAGNOSIS — R4701 Aphasia: Secondary | ICD-10-CM | POA: Diagnosis not present

## 2019-05-16 DIAGNOSIS — R531 Weakness: Secondary | ICD-10-CM | POA: Diagnosis not present

## 2019-05-16 DIAGNOSIS — F54 Psychological and behavioral factors associated with disorders or diseases classified elsewhere: Secondary | ICD-10-CM | POA: Diagnosis present

## 2019-05-16 DIAGNOSIS — X58XXXD Exposure to other specified factors, subsequent encounter: Secondary | ICD-10-CM | POA: Diagnosis present

## 2019-05-16 DIAGNOSIS — F329 Major depressive disorder, single episode, unspecified: Secondary | ICD-10-CM | POA: Diagnosis present

## 2019-05-16 DIAGNOSIS — Z9049 Acquired absence of other specified parts of digestive tract: Secondary | ICD-10-CM | POA: Diagnosis not present

## 2019-05-16 DIAGNOSIS — U071 COVID-19: Secondary | ICD-10-CM | POA: Diagnosis not present

## 2019-05-16 DIAGNOSIS — R29810 Facial weakness: Secondary | ICD-10-CM | POA: Diagnosis not present

## 2019-05-16 DIAGNOSIS — Z79899 Other long term (current) drug therapy: Secondary | ICD-10-CM

## 2019-05-16 DIAGNOSIS — E875 Hyperkalemia: Secondary | ICD-10-CM | POA: Diagnosis present

## 2019-05-16 DIAGNOSIS — Z743 Need for continuous supervision: Secondary | ICD-10-CM | POA: Diagnosis not present

## 2019-05-16 DIAGNOSIS — Z86010 Personal history of colonic polyps: Secondary | ICD-10-CM | POA: Diagnosis not present

## 2019-05-16 DIAGNOSIS — E878 Other disorders of electrolyte and fluid balance, not elsewhere classified: Secondary | ICD-10-CM | POA: Diagnosis present

## 2019-05-16 DIAGNOSIS — Z818 Family history of other mental and behavioral disorders: Secondary | ICD-10-CM

## 2019-05-16 DIAGNOSIS — L089 Local infection of the skin and subcutaneous tissue, unspecified: Secondary | ICD-10-CM | POA: Diagnosis present

## 2019-05-16 DIAGNOSIS — S31109D Unspecified open wound of abdominal wall, unspecified quadrant without penetration into peritoneal cavity, subsequent encounter: Secondary | ICD-10-CM

## 2019-05-16 DIAGNOSIS — I639 Cerebral infarction, unspecified: Secondary | ICD-10-CM

## 2019-05-16 DIAGNOSIS — Y906 Blood alcohol level of 120-199 mg/100 ml: Secondary | ICD-10-CM | POA: Diagnosis present

## 2019-05-16 DIAGNOSIS — Z791 Long term (current) use of non-steroidal anti-inflammatories (NSAID): Secondary | ICD-10-CM

## 2019-05-16 DIAGNOSIS — R52 Pain, unspecified: Secondary | ICD-10-CM | POA: Diagnosis not present

## 2019-05-16 DIAGNOSIS — Z8711 Personal history of peptic ulcer disease: Secondary | ICD-10-CM

## 2019-05-16 DIAGNOSIS — Z903 Acquired absence of stomach [part of]: Secondary | ICD-10-CM

## 2019-05-16 LAB — DIFFERENTIAL
Abs Immature Granulocytes: 0.01 10*3/uL (ref 0.00–0.07)
Basophils Absolute: 0 10*3/uL (ref 0.0–0.1)
Basophils Relative: 1 %
Eosinophils Absolute: 0 10*3/uL (ref 0.0–0.5)
Eosinophils Relative: 1 %
Immature Granulocytes: 0 %
Lymphocytes Relative: 46 %
Lymphs Abs: 1.6 10*3/uL (ref 0.7–4.0)
Monocytes Absolute: 0.3 10*3/uL (ref 0.1–1.0)
Monocytes Relative: 8 %
Neutro Abs: 1.6 10*3/uL — ABNORMAL LOW (ref 1.7–7.7)
Neutrophils Relative %: 44 %

## 2019-05-16 LAB — COMPREHENSIVE METABOLIC PANEL
ALT: 37 U/L (ref 0–44)
AST: 44 U/L — ABNORMAL HIGH (ref 15–41)
Albumin: 3.8 g/dL (ref 3.5–5.0)
Alkaline Phosphatase: 65 U/L (ref 38–126)
Anion gap: 16 — ABNORMAL HIGH (ref 5–15)
BUN: 6 mg/dL (ref 6–20)
CO2: 18 mmol/L — ABNORMAL LOW (ref 22–32)
Calcium: 8.7 mg/dL — ABNORMAL LOW (ref 8.9–10.3)
Chloride: 99 mmol/L (ref 98–111)
Creatinine, Ser: 0.7 mg/dL (ref 0.44–1.00)
GFR calc Af Amer: >60 mL/min (ref >60)
GFR calc non Af Amer: >60 mL/min (ref >60)
Glucose, Bld: 113 mg/dL — ABNORMAL HIGH (ref 70–99)
Potassium: 4.3 mmol/L (ref 3.5–5.1)
Sodium: 133 mmol/L — ABNORMAL LOW (ref 135–145)
Total Bilirubin: 0.8 mg/dL (ref 0.3–1.2)
Total Protein: 7.4 g/dL (ref 6.5–8.1)

## 2019-05-16 LAB — I-STAT CHEM 8, ED
BUN: 7 mg/dL (ref 6–20)
Calcium, Ion: 1 mmol/L — ABNORMAL LOW (ref 1.15–1.40)
Chloride: 101 mmol/L (ref 98–111)
Creatinine, Ser: 0.9 mg/dL (ref 0.44–1.00)
Glucose, Bld: 105 mg/dL — ABNORMAL HIGH (ref 70–99)
HCT: 40 % (ref 36.0–46.0)
Hemoglobin: 13.6 g/dL (ref 12.0–15.0)
Potassium: 4.3 mmol/L (ref 3.5–5.1)
Sodium: 134 mmol/L — ABNORMAL LOW (ref 135–145)
TCO2: 20 mmol/L — ABNORMAL LOW (ref 22–32)

## 2019-05-16 LAB — CBC
HCT: 36.5 % (ref 36.0–46.0)
Hemoglobin: 12.9 g/dL (ref 12.0–15.0)
MCH: 28.4 pg (ref 26.0–34.0)
MCHC: 35.3 g/dL (ref 30.0–36.0)
MCV: 80.4 fL (ref 80.0–100.0)
Platelets: 344 10*3/uL (ref 150–400)
RBC: 4.54 MIL/uL (ref 3.87–5.11)
RDW: 14.5 % (ref 11.5–15.5)
WBC: 3.6 10*3/uL — ABNORMAL LOW (ref 4.0–10.5)
nRBC: 0 % (ref 0.0–0.2)

## 2019-05-16 LAB — CBG MONITORING, ED: Glucose-Capillary: 110 mg/dL — ABNORMAL HIGH (ref 70–99)

## 2019-05-16 LAB — APTT: aPTT: 34 seconds (ref 24–36)

## 2019-05-16 LAB — I-STAT BETA HCG BLOOD, ED (MC, WL, AP ONLY): I-stat hCG, quantitative: <5 m[IU]/mL (ref <5)

## 2019-05-16 LAB — PROTIME-INR
INR: 1.4 — ABNORMAL HIGH (ref 0.8–1.2)
Prothrombin Time: 17 seconds — ABNORMAL HIGH (ref 11.4–15.2)

## 2019-05-16 MED ORDER — SODIUM CHLORIDE 0.9 % IV BOLUS
1000.0000 mL | Freq: Once | INTRAVENOUS | Status: AC
  Administered 2019-05-16: 1000 mL via INTRAVENOUS

## 2019-05-16 MED ORDER — SODIUM CHLORIDE 0.9% FLUSH
3.0000 mL | Freq: Once | INTRAVENOUS | Status: AC
  Administered 2019-05-16: 3 mL via INTRAVENOUS

## 2019-05-16 MED ORDER — ASPIRIN EC 81 MG PO TBEC
81.0000 mg | DELAYED_RELEASE_TABLET | Freq: Every day | ORAL | Status: DC
  Administered 2019-05-17 – 2019-05-18 (×2): 81 mg via ORAL
  Filled 2019-05-16 (×2): qty 1

## 2019-05-16 MED ORDER — SODIUM CHLORIDE 0.9 % IV SOLN: INTRAVENOUS | Status: DC

## 2019-05-16 MED ORDER — ACETAMINOPHEN 650 MG RE SUPP
650.0000 mg | Freq: Four times a day (QID) | RECTAL | Status: DC | PRN
  Administered 2019-05-16: 650 mg via RECTAL
  Filled 2019-05-16: qty 1

## 2019-05-16 MED ORDER — ACETAMINOPHEN 325 MG PO TABS: 650.0000 mg | ORAL_TABLET | Freq: Four times a day (QID) | ORAL | Status: DC | PRN

## 2019-05-16 MED ORDER — ENOXAPARIN SODIUM 40 MG/0.4ML ~~LOC~~ SOLN
40.0000 mg | Freq: Every day | SUBCUTANEOUS | Status: DC
  Administered 2019-05-17 – 2019-05-18 (×2): 40 mg via SUBCUTANEOUS
  Filled 2019-05-16 (×2): qty 0.4

## 2019-05-16 MED ORDER — IOHEXOL 350 MG/ML SOLN
100.0000 mL | Freq: Once | INTRAVENOUS | Status: AC | PRN
  Administered 2019-05-16: 100 mL via INTRAVENOUS

## 2019-05-16 NOTE — ED Notes (Signed)
Admitting MD at bedside.

## 2019-05-16 NOTE — ED Notes (Signed)
Pt now responding to all questions appropriately with clear speech after NIH scoring exam ended. Stops responding in full sentences when I restart NIH scoring. Note this may affect NIH scoring in chart.

## 2019-05-16 NOTE — ED Notes (Signed)
Mom- carolyn halloway [phone] for updates

## 2019-05-16 NOTE — ED Provider Notes (Signed)
[facility] EMERGENCY DEPARTMENT Provider Note   CSN: [id number] Arrival date & time: 05/16/19  1907  An emergency department physician performed an initial assessment on this suspected stroke patient at 44.  History Chief Complaint  Patient presents with  . Code Stroke    Angel Price is a 54 y.o. female.  The history is provided by the patient. No language interpreter was used.  Neurologic Problem This is a new problem. The current episode started 3 to 5 hours ago. The problem occurs constantly. The problem has not changed since onset.Associated symptoms include headaches. Pertinent negatives include no chest pain, no abdominal pain and no shortness of breath. Nothing aggravates the symptoms. Nothing relieves the symptoms. She has tried nothing for the symptoms. The treatment provided no relief.       Past Medical History:  Diagnosis Date  . Abscess    soft tissue  . Adrenal mass ([place])   . Alcohol abuse   . Anxiety   . Blood transfusion without reported diagnosis   . Chronic abdominal pain   . Chronic wound infection of abdomen   . Colon polyp    colonoscopy 04/2014  . Depression   . Diverticulosis    colonoscopy 04/2014 moderat pan colonic  . Drug-seeking behavior   . Gastritis    EGD 05/2014  . Gastroparesis Nov 2015  . GERD (gastroesophageal reflux disease)   . Hemorrhoid    internal large  . Hiatal hernia   . History of Billroth II operation   . Hypertension   . Lung nodule    CT 02/2014 needs repeat 1 month  . Lung nodule < 6cm on CT 04/25/2014  . Lupus ([place])   . Malingering   . Nausea and vomiting    chronic, recurrent  . Pancreatitis   . Schatzki's ring    patent per EGD 04/2014  . Sickle cell trait ([place])   . Suicide attempt ([place])   . 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 Intractable vomiting with nausea 02/23/2019  . Abnormal CT scan, colon 12/09/2018  .  Abnormal computed tomography of cecum and terminal ileum 12/09/2018  . Cocaine abuse ([place])   . Colitis 11/11/2018  . Hemorrhoidal skin tag   . SBO (small bowel obstruction) ([place])   . Non-intractable vomiting with nausea   . Folate deficiency 05/11/2017  . Abdominal wall fistula   . Cellulitis of abdominal wall 01/02/2017  . Acute left hemiparesis ([place]) 12/05/2016  . Malingering 12/05/2016  . Weakness of left lower extremity 12/02/2016  . CVA (cerebral vascular accident) ([place]) 11/23/2016  . Bright red rectal bleeding 08/22/2016  . Hypotension due to blood loss   . Acute GI bleeding 05/24/2016  . Dieulafoy lesion of duodenum   . History of Billroth II operation   . Gastrointestinal hemorrhage 05/22/2016  . Absolute anemia   . Acute pancreatitis 10/01/2015  . Abnormal CT scan of lung 10/01/2015  . Alcohol intoxication ([place])   . Left-sided weakness   . Psychosomatic factor in physical condition   . Upper GI bleed   . Diverticulosis of colon with hemorrhage   . Chronic wound infection of abdomen 12/22/2014  . Thyroid disease 12/22/2014  . HTN (hypertension) 12/22/2014  . Ataxia 11/01/2014  . Hemorrhoids 04/26/2014  . Lung nodule 04/26/2014  . Diverticulosis   . Gastritis   . Hiatal hernia   . Schatzki's ring   .  Acute blood loss anemia 04/25/2014  . Sinus tachycardia 04/25/2014  . Hypokalemia 04/25/2014  . Hematemesis with nausea 04/25/2014  . Lung nodule < 6cm on CT 04/25/2014  . Intractable nausea and vomiting 04/24/2014  . Gastroparesis   . Abdominal pain 04/01/2014  . Chronic abdominal pain 01/21/2014  . Gastroenteritis 12/10/2013  . Chronic abdominal wound infection 08/16/2013  . MDD (major depressive disorder), recurrent episode, severe ([place]) 06/27/2013  . Wrist laceration 06/24/2013  . Diarrhea 05/07/2013  . Rectal bleeding 05/07/2013  . Abnormal LFTs 05/07/2013  . Adrenal mass, left ([place]) 05/07/2013  . Abdominal wall abscess at site of surgical wound  04/19/2013  . Abdominal wall abscess 04/18/2013  . Frequent headaches 03/30/2013  . Sleep difficulties 03/30/2013  . Essential hypertension, benign 03/30/2013  . History of cocaine abuse ([place]) 03/16/2013  . History of schizoaffective disorder 03/16/2013  . Bipolar disorder ([place]) 03/16/2013  . Personality disorder ([place]) 03/16/2013  . Tobacco abuse 03/16/2013  . Alcohol abuse 03/16/2013  . Palpitations 03/16/2013  . Poor vision 03/16/2013  . History of gastric bypass 03/16/2013  . Status post hysterectomy with oophorectomy 03/16/2013  . Hypothyroid 03/16/2013  . Lupus ([place]) 03/16/2013    Past Surgical History:  Procedure Laterality Date  . ABDOMINAL HYSTERECTOMY  2013   [place]  . ABDOMINAL SURGERY    . ADRENALECTOMY Right   . AGILE CAPSULE N/A 01/05/2015   Procedure: AGILE CAPSULE;  Surgeon: Daneil Dolin, MD;  [facility];  Service: Endoscopy;  Laterality: N/A;  0700  . Billroth II procedure      [place], first 2000, 2005/2006.  Marland Kitchen BIOPSY  05/20/2013   Procedure: BIOPSIES OF ASCENDING AND SIGMOID COLON;  Surgeon: Daneil Dolin, MD;  Location: [facility];  Service: Endoscopy;;  . BIOPSY  04/26/2014   Procedure: BIOPSIES;  Surgeon: Danie Binder, MD;  Location: [facility];  Service: Endoscopy;;  . BIOPSY  12/24/2018   Procedure: BIOPSY;  Surgeon: Daneil Dolin, MD;  Location[facility];  Service: Endoscopy;;  colon  . CHOLECYSTECTOMY    . COLONOSCOPY     in [place]  . COLONOSCOPY WITH PROPOFOL N/A 05/20/2013   Dr.Rourk- inadequate prep, normal appearing rectum, grossly normal colon aside from pancolonic diverticula, normal terminal ileum bx= unremarkable colonic mucosa. Due for early interval 2016.   Marland Kitchen COLONOSCOPY WITH PROPOFOL N/A 04/26/2014   [id number] ileum/one colon polyp removed/moderate pan-colonic diverticulosis/large internal hemorrhoids  . COLONOSCOPY WITH PROPOFOL N/A 12/23/2014   Dr.Rourk- minimal internal hemorrhoids, pancolonic diverticulosis  .  COLONOSCOPY WITH PROPOFOL N/A 08/23/2016   Procedure: COLONOSCOPY WITH PROPOFOL;  Surgeon: Danie Binder, MD;  [facility];  Service: Endoscopy;  Laterality: N/A;  . COLONOSCOPY WITH PROPOFOL N/A 12/24/2018   Pancolonic diverticulosis, normal TI, one 5 mm polyp in rectum (tubular adenoma). Somewhat friable hemorrhagic mucosa in area of IC valve/cecum, possibly related to scope trauma s/p biopsy.   . DEBRIDEMENT OF ABDOMINAL WALL ABSCESS N/A 02/08/2013   Procedure: DEBRIDEMENT OF ABDOMINAL WALL ABSCESS;  Surgeon: Jamesetta So, MD;  Location: [facility];  Service: General;  Laterality: N/A;  . ESOPHAGOGASTRODUODENOSCOPY (EGD) WITH PROPOFOL N/A 05/20/2013   Dr.Rourk- s/p prior gastric surgery with normal esophagus, residual gastric mucosa and patent efferent limb  . ESOPHAGOGASTRODUODENOSCOPY (EGD) WITH PROPOFOL N/A 02/03/2014   Dr. Gala Romney:  s/p hemigastrectomy with retained gastric contents. Residual gastric mucosa and efferent limb appeared normal otherwise. Query gastroparesis.   Marland Kitchen ESOPHAGOGASTRODUODENOSCOPY (EGD) WITH PROPOFOL N/A 04/26/2014  [id number] ring/small HH/mild non-erosive gasrtitis/normal anastomosis  . ESOPHAGOGASTRODUODENOSCOPY (EGD) WITH PROPOFOL N/A 12/23/2014   Dr.Rourk- s/p prior hemigastrctomy, active oozing from anastomotic suture site, hemostasis achieved  . ESOPHAGOGASTRODUODENOSCOPY (EGD) WITH PROPOFOL N/A 05/23/2016   Dr. 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 ESOPHAGOGASTRODUODENOSCOPY (EGD) WITH PROPOFOL N/A 05/11/2017   Procedure: ESOPHAGOGASTRODUODENOSCOPY (EGD) WITH PROPOFOL;  Surgeon: Daneil Dolin, MD;  [facility];  Service: Endoscopy;  Laterality: N/A;  . HEMORRHOID SURGERY N/A 06/18/2017   Procedure: THREE COLUMN EXTENSIVE HEMORRHOIDECTOMY;  Surgeon: Virl Cagey, MD;  Location: [facility];  Service: General;  Laterality: N/A;  . INCISION AND  DRAINAGE ABSCESS N/A 01/06/2017   Procedure: INCISION AND DRAINAGE ABDOMINAL WALL ABSCESS;  Surgeon: Aviva Signs, MD;  Location: [facility];  Service: General;  Laterality: N/A;  . POLYPECTOMY  12/24/2018   Procedure: POLYPECTOMY;  Surgeon: Daneil Dolin, MD;  [facility];  Service: Endoscopy;;  colon  . tendon repar Right    wrist  . WOUND EXPLORATION Right 06/24/2013   Procedure: exploration of traumatic wound right wrist;  Surgeon: Tennis Must, MD;  Location: [facility];  Service: Orthopedics;  Laterality: Right;     OB History    Gravida  4   Para  4   Term  4   Preterm      AB      Living  3     SAB      TAB      Ectopic      Multiple      Live Births              Family History  Problem Relation Age of Onset  . Brain cancer Son   . Schizophrenia Son   . Cancer Son        brain  . Lung cancer Father   . Cancer Father        mets  . Drug abuse Mother   . Breast cancer Maternal Aunt   . Bipolar disorder Maternal Aunt   . Drug abuse Maternal Aunt   . Colon cancer Maternal Grandmother        late 68s, early 23s  . Drug abuse Sister   . Drug abuse Brother   . Bipolar disorder Paternal Grandfather   . Bipolar disorder Cousin   . Liver disease Neg Hx     Social History   Tobacco Use  . Smoking status: Current Every Day Smoker    Packs/day: 0.50    Years: 35.00    Pack years: 17.50    Types: Cigarettes  . Smokeless tobacco: Never Used  Substance Use Topics  . Alcohol use: Not Currently  . Drug use: Not Currently    Types: Cocaine    Home Medications Prior to Admission medications   Medication Sig Start Date End Date Taking? Authorizing Provider  acetaminophen (TYLENOL) 500 MG tablet Take 2 tablets (1,000 mg total) by mouth 3 (three) times daily as needed for mild pain or moderate pain. 02/26/19   Enzo Bi, MD  amLODipine (NORVASC) 10 MG tablet Take 0.5 tablets (5 mg total) by mouth daily. Patient taking differently: Take 10 mg by  mouth daily.  01/03/17   Doreatha Lew, MD  atenolol (TENORMIN) 50 MG tablet Take 50 mg by mouth daily. 02/01/19   [provider]  cyclobenzaprine (FLEXERIL) 5 MG tablet Take  1 tablet (5 mg total) by mouth 3 (three) times daily as needed for muscle spasms. 02/26/19   Enzo Bi, MD  dicyclomine (BENTYL) 10 MG capsule Take 1 capsule (10 mg total) by mouth 4 (four) times daily -  before meals and at bedtime. For diarrhea and abdominal cramps 12/09/18   Mahala Menghini, PA-C  escitalopram (LEXAPRO) 20 MG tablet Take 20 mg daily by mouth.    [provider]  folic acid (FOLVITE) 1 MG tablet Take 1 tablet (1 mg total) by mouth daily. 11/13/18   Roxan Hockey, MD  ibuprofen (ADVIL) 800 MG tablet Take 800 mg by mouth every 8 (eight) hours as needed for moderate pain.  04/15/19   [provider]  mupirocin cream (BACTROBAN) 2 % Apply topically 2 (two) times daily. 02/26/19   Enzo Bi, MD  omeprazole (PRILOSEC) 20 MG capsule Take 1 capsule (20 mg total) by mouth daily for 14 days. 01/27/19 02/23/19  McDonald, Mia A, PA-C  ondansetron (ZOFRAN ODT) 4 MG disintegrating tablet Take 1 tablet (4 mg total) by mouth every 8 (eight) hours as needed for nausea or vomiting. 05/09/19   Caccavale, Sophia, PA-C  ondansetron (ZOFRAN) 4 MG tablet Take 1 tablet (4 mg total) by mouth every 6 (six) hours as needed for nausea. [id number]   Delora Fuel, MD  pantoprazole (PROTONIX) 40 MG tablet Take 40 mg by mouth daily. 04/29/19   [provider]  promethazine (PHENERGAN) 25 MG suppository Place 1 suppository (25 mg total) rectally every 6 (six) hours as needed for nausea or vomiting. 01/27/19   McDonald, Mia A, PA-C  thiamine 100 MG tablet Take 1 tablet (100 mg total) by mouth daily. 11/13/18   Roxan Hockey, MD  traMADol (ULTRAM) 50 MG tablet Take 1 tablet (50 mg total) by mouth every 12 (twelve) hours as needed for up to 15 doses. 03/10/19   Curatolo, Adam, DO  vitamin B-12  (CYANOCOBALAMIN) 1000 MCG tablet Take 1,000 mcg by mouth daily.    [provider]    Allergies    Codeine, Morphine and related, and Reglan [metoclopramide]  Review of Systems   Review of Systems  Constitutional: Negative for chills, diaphoresis, fatigue and fever.  HENT: Negative for congestion and rhinorrhea.   Eyes: Positive for visual disturbance.  Respiratory: Negative for cough, chest tightness, shortness of breath and wheezing.   Cardiovascular: Negative for chest pain, palpitations and leg swelling.  Gastrointestinal: Negative for abdominal pain, constipation, diarrhea, nausea and vomiting.  Genitourinary: Negative for dysuria, flank pain and frequency.  Musculoskeletal: Negative for back pain, neck pain and neck stiffness.  Skin: Negative for rash and wound.  Neurological: Positive for weakness, numbness and headaches.  Psychiatric/Behavioral: Negative for agitation and confusion.  All other systems reviewed and are negative.   Physical Exam Updated Vital Signs BP 113/81   Pulse 76   Temp 98.5 F (36.9 C) (Oral)   Resp 15   SpO2 98%   Physical Exam Vitals and nursing note reviewed.  Constitutional:      General: She is not in acute distress.    Appearance: She is well-developed. She is not ill-appearing, toxic-appearing or diaphoretic.  HENT:     Head: Normocephalic and atraumatic.     Nose: Nose normal. No congestion or rhinorrhea.     Mouth/Throat:     Mouth: Mucous membranes are moist.     Pharynx: No oropharyngeal exudate or posterior oropharyngeal erythema.  Eyes:     Conjunctiva/sclera: Conjunctivae normal.  Pupils: Pupils are equal, round, and reactive to light.  Cardiovascular:     Rate and Rhythm: Normal rate and regular rhythm.     Heart sounds: No murmur.  Pulmonary:     Effort: Pulmonary effort is normal. No respiratory distress.     Breath sounds: Normal breath sounds. No wheezing, rhonchi or rales.  Chest:     Chest wall: No  tenderness.  Abdominal:     General: Abdomen is flat.     Palpations: Abdomen is soft.     Tenderness: There is no abdominal tenderness.  Musculoskeletal:        General: No tenderness.     Cervical back: Neck supple. No tenderness.     Right lower leg: No edema.     Left lower leg: No edema.  Skin:    General: Skin is warm and dry.     Capillary Refill: Capillary refill takes less than 2 seconds.     Findings: No erythema.  Neurological:     Mental Status: She is alert.     Cranial Nerves: No dysarthria or facial asymmetry.     Sensory: Sensory deficit present.     Motor: Weakness and abnormal muscle tone present.     Comments: Patient has inconsistent neurologic exam but at this time is not moving her right arm or right leg.  She is not speaking intermittently.  She reports numbness in her right hemibody.  Exam otherwise unremarkable.  Psychiatric:        Mood and Affect: Mood normal.     ED Results / Procedures / Treatments   Labs (all labs ordered are listed, but only abnormal results are displayed) Labs Reviewed  PROTIME-INR - Abnormal; Notable for the following components:      Result Value   Prothrombin Time 17.0 (*)    INR 1.4 (*)    All other components within normal limits  CBC - Abnormal; Notable for the following components:   WBC 3.6 (*)    All other components within normal limits  DIFFERENTIAL - Abnormal; Notable for the following components:   Neutro Abs 1.6 (*)    All other components within normal limits  COMPREHENSIVE METABOLIC PANEL - Abnormal; Notable for the following components:   Sodium 133 (*)    CO2 18 (*)    Glucose, Bld 113 (*)    Calcium 8.7 (*)    AST 44 (*)    Anion gap 16 (*)    All other components within normal limits  ETHANOL - Abnormal; Notable for the following components:   Alcohol, Ethyl (B) 198 (*)    All other components within normal limits  SALICYLATE LEVEL - Abnormal; Notable for the following components:   Salicylate Lvl  [id number] (*)    All other components within normal limits  LACTIC ACID, PLASMA - Abnormal; Notable for the following components:   Lactic Acid, Venous 3.9 (*)    All other components within normal limits  I-STAT CHEM 8, ED - Abnormal; Notable for the following components:   Sodium 134 (*)    Glucose, Bld 105 (*)    Calcium, Ion 1.00 (*)    TCO2 20 (*)    All other components within normal limits  CBG MONITORING, ED - Abnormal; Notable for the following components:   Glucose-Capillary 110 (*)    All other components within normal limits  SARS CORONAVIRUS 2 (TAT 6-24 HRS)  CULTURE, BLOOD (ROUTINE X 2)  CULTURE, BLOOD (ROUTINE X 2)  APTT  BASIC METABOLIC PANEL  RAPID URINE DRUG SCREEN, HOSP PERFORMED  I-STAT BETA HCG BLOOD, ED ([facility], [facility], [facility] ONLY)    EKG None  Radiology CT Code Stroke CTA Head W/WO contrast  Result Date: 05/16/2019 CLINICAL DATA:  Right-sided weakness and aphasia. EXAM: CT ANGIOGRAPHY HEAD AND NECK TECHNIQUE: Multidetector CT imaging of the head and neck was performed using the standard protocol during bolus administration of intravenous contrast. Multiplanar CT image reconstructions and MIPs were obtained to evaluate the vascular anatomy. Carotid stenosis measurements (when applicable) are obtained utilizing NASCET criteria, using the distal internal carotid diameter as the denominator. CONTRAST:  144mL OMNIPAQUE IOHEXOL 350 MG/ML SOLN COMPARISON:  12/20/2016 FINDINGS: CTA NECK FINDINGS Aortic arch: Normal variant aortic arch branching pattern with common origin of the brachiocephalic and left common carotid arteries. Minimal atherosclerotic calcification in the aortic arch. Widely patent arch vessel origins. Right carotid system: Patent and smooth without evidence of stenosis or dissection. Left carotid system: Patent and smooth without evidence of stenosis or dissection. Vertebral arteries: Patent, smooth, and codominant without evidence of stenosis or dissection. Skeleton:  Moderate disc degeneration at C5-6. Other neck: No evidence of cervical lymphadenopathy or mass. Upper chest: Clear lung apices. Review of the MIP images confirms the above findings CTA HEAD FINDINGS Anterior circulation: The internal carotid arteries are patent from skull base to carotid termini with minimal nonstenotic plaque bilaterally. ACAs and MCAs are patent without evidence of proximal branch occlusion or significant proximal stenosis. No aneurysm is identified. Posterior circulation: The intracranial vertebral arteries are widely patent to the basilar. Patent PICAs and SCAs are seen bilaterally. The basilar artery is widely patent. There are right larger than left posterior communicating arteries. Both PCAs are patent without evidence of significant proximal stenosis. No aneurysm is identified. Venous sinuses: As permitted by contrast timing, patent. Anatomic variants: None. Review of the MIP images confirms the above findings IMPRESSION: 1. Minimal atherosclerosis without large vessel occlusion or significant stenosis in the head and neck. 2.  Aortic Atherosclerosis (ICD10-I70.0). These results were communicated to Dr. Cheral Marker at 7:50 pm on 05/16/2019 by text page via the [facility] messaging system. Electronically Signed   By: Logan Bores M.D.   On: 05/16/2019 19:55   CT Code Stroke CTA Neck W/WO contrast  Result Date: 05/16/2019 CLINICAL DATA:  Right-sided weakness and aphasia. EXAM: CT ANGIOGRAPHY HEAD AND NECK TECHNIQUE: Multidetector CT imaging of the head and neck was performed using the standard protocol during bolus administration of intravenous contrast. Multiplanar CT image reconstructions and MIPs were obtained to evaluate the vascular anatomy. Carotid stenosis measurements (when applicable) are obtained utilizing NASCET criteria, using the distal internal carotid diameter as the denominator. CONTRAST:  13mL OMNIPAQUE IOHEXOL 350 MG/ML SOLN COMPARISON:  12/20/2016 FINDINGS: CTA NECK FINDINGS  Aortic arch: Normal variant aortic arch branching pattern with common origin of the brachiocephalic and left common carotid arteries. Minimal atherosclerotic calcification in the aortic arch. Widely patent arch vessel origins. Right carotid system: Patent and smooth without evidence of stenosis or dissection. Left carotid system: Patent and smooth without evidence of stenosis or dissection. Vertebral arteries: Patent, smooth, and codominant without evidence of stenosis or dissection. Skeleton: Moderate disc degeneration at C5-6. Other neck: No evidence of cervical lymphadenopathy or mass. Upper chest: Clear lung apices. Review of the MIP images confirms the above findings CTA HEAD FINDINGS Anterior circulation: The internal carotid arteries are patent from skull base to carotid termini with minimal nonstenotic plaque bilaterally. ACAs and MCAs are patent  without evidence of proximal branch occlusion or significant proximal stenosis. No aneurysm is identified. Posterior circulation: The intracranial vertebral arteries are widely patent to the basilar. Patent PICAs and SCAs are seen bilaterally. The basilar artery is widely patent. There are right larger than left posterior communicating arteries. Both PCAs are patent without evidence of significant proximal stenosis. No aneurysm is identified. Venous sinuses: As permitted by contrast timing, patent. Anatomic variants: None. Review of the MIP images confirms the above findings IMPRESSION: 1. Minimal atherosclerosis without large vessel occlusion or significant stenosis in the head and neck. 2.  Aortic Atherosclerosis (ICD10-I70.0). These results were communicated to Dr. Cheral Marker at 7:50 pm on 05/16/2019 by text page via the [facility] messaging system. Electronically Signed   By: Logan Bores M.D.   On: 05/16/2019 19:55   CT HEAD CODE STROKE WO CONTRAST  Result Date: 05/16/2019 CLINICAL DATA:  Code stroke.  Right-sided weakness and aphasia. EXAM: CT HEAD WITHOUT CONTRAST  TECHNIQUE: Contiguous axial images were obtained from the base of the skull through the vertex without intravenous contrast. COMPARISON:  Head MRI 05/09/2019 FINDINGS: Brain: There is no evidence of acute infarct, intracranial hemorrhage, mass, midline shift, or extra-axial fluid collection. A partially empty sella is unchanged. The ventricles and sulci are normal. Vascular: Calcified atherosclerosis at the skull base. No hyperdense vessel. Skull: No fracture or suspicious osseous lesion. Sinuses/Orbits: Visualized paranasal sinuses and mastoid air cells are clear. Orbits are unremarkable. Other: None. ASPECTS [facility] Stroke Program Early CT Score) - Ganglionic level infarction (caudate, lentiform nuclei, internal capsule, insula, M1-M3 cortex): 7 - Supraganglionic infarction (M4-M6 cortex): 3 Total score (0-10 with 10 being normal): 10 IMPRESSION: 1. No evidence of acute intracranial abnormality. 2. ASPECTS is 10. These results were communicated to Dr. Cheral Marker at 7:26 pm on 05/16/2019 by text page via the [facility] messaging system. Electronically Signed   By: Logan Bores M.D.   On: 05/16/2019 19:26    Procedures Procedures (including critical care time)  Medications Ordered in ED Medications  sodium chloride flush (NS) 0.9 % injection 3 mL (3 mLs Intravenous Given 05/16/19 1946)  iohexol (OMNIPAQUE) 350 MG/ML injection 100 mL (100 mLs Intravenous Contrast Given 05/16/19 1926)    ED Course  I have reviewed the triage vital signs and the nursing notes.  Pertinent labs & imaging results that were available during my care of the patient were reviewed by me and considered in my medical decision making (see chart for details).    MDM Rules/Calculators/A&P                      JOELI MASTELLONE is a 54 y.o. female with a past medical history significant for polysubstance abuse, schizoaffective disorder, gastric bypass, lupus, chronic headaches, hypertension, prior abdominal surgeries, and prior strokes who  presents as a code stroke.  According to patient and EMS, she was last normal at 11 AM.  She was having headache and some vision changes and then was unable to move her right arm or right leg.  She also reportedly had facial droop.  Patient reports that she was doing well until the symptoms began.  She says she is normally able to move her arm and right leg without difficulty and could not walk due to the weakness.  She reports pain in her whole left hemibody.  She denies any shortness of breath, cough, fevers, or chills.  She denies neck pain or neck stiffness.  She has not any recent medication changes  or taken any medicines.  She denies any urinary or GI symptoms.  Patient was seen by neurology on arrival and taken to the CT scanner for the code stroke evaluation.  On my exam, patient does have some inconsistency with her speech as she is able to answer questions but what she was doing and then will intermittently be unable to open her mouth when I wanted to examine her oropharynx.  Difficult to assess effort.  Patient had decreased grip strength in right leg strength compared to left.  She reported she had decree sensation in right arm, right leg, and right face.  I did not see facial droop.  Normal extraocular movements.  Normal pupils.  Spoke with neurology who recommend she be admitted for further management of the weakness and numbness and difficulty with ambulation.  Neurology does not think this is an acute stroke at this time.  We will call for admission based on these recommendations.    Final Clinical Impression(s) / ED Diagnoses Final diagnoses:  Right sided weakness    Rx / DC Orders ED Discharge Orders    None     Clinical Impression: 1. Right sided weakness     Disposition: Admit  This note was prepared with assistance of Dragon voice recognition software. Occasional wrong-word or sound-a-like substitutions may have occurred due to the inherent limitations of voice  recognition software.     Tegeler, Gwenyth Allegra, MD 05/17/19 [phone]

## 2019-05-16 NOTE — Consult Note (Signed)
Referring Physician: Dr. Sherry Ruffing     Chief Complaint: Acute onset of right sided weakness and global aphasia.   HPI: Angel Price is an 54 y.o. female with an extensive PMHx as listed below, recent presentation for stroke like symptoms who presents to the [facility] ED via EMS with acute onset of headache, right sided weakness and global aphasia. LKN was 11 AM when her boyfriend left home for work. Per EMS, she called him about 2 hours ago complaining of being unable to see, in addition to a severe headache. He came back home, and noted that she was unable to move her RUE and RLE. She was complaining of shooting pain down her right side as well.   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 He stated that the last stroke was 1 week ago.   She is not taking a blood thinner.   Dr. Leonel Ramsay saw the patient on 2/21 for right sided weakness, per his HPI: "Angel Price is a 54 y.o. female with a history of a previous episode of weakness that was MRI negative.  At the time Dr. 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  Today, she states that she woke up with right-sided weakness.  Though she reports previous stroke, I think she is referring to the episode that she had previously with negative MRI.  She also has a documented history of malingering." MRI brain on 2/21 was negative. Impression per Dr. 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  No further work-up from a neurological perspective was indicated at that time.    LSN: 11 AM tPA Given: No: Out of time window  Past Medical History:  Diagnosis Date  . Abscess    soft tissue  . Adrenal mass ([place])   . Alcohol abuse   . Anxiety   . Blood transfusion without reported diagnosis   . Chronic abdominal pain   . Chronic wound infection of abdomen   . Colon polyp     colonoscopy 04/2014  . Depression   . Diverticulosis    colonoscopy 04/2014 moderat pan colonic  . Drug-seeking behavior   . Gastritis    EGD 05/2014  . Gastroparesis Nov 2015  . GERD (gastroesophageal reflux disease)   . Hemorrhoid    internal large  . Hiatal hernia   . History of Billroth II operation   . Hypertension   . Lung nodule    CT 02/2014 needs repeat 1 month  . Lung nodule < 6cm on CT 04/25/2014  . Lupus ([place])   . Malingering   . Nausea and vomiting    chronic, recurrent  . Pancreatitis   . Schatzki's ring    patent per EGD 04/2014  . Sickle cell trait ([place])   . Suicide attempt ([place])   . 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 ABDOMINAL HYSTERECTOMY  2013   [place]  . ABDOMINAL SURGERY    . ADRENALECTOMY Right   . AGILE CAPSULE N/A 01/05/2015   Procedure: AGILE CAPSULE;  Surgeon: Daneil Dolin, MD;  [facility];  Service: Endoscopy;  Laterality: N/A;  0700  . Billroth II procedure      [place], first 2000, 2005/2006.  Marland Kitchen BIOPSY  05/20/2013   Procedure: BIOPSIES OF ASCENDING  AND SIGMOID COLON;  Surgeon: Daneil Dolin, MD;  Location: [facility];  Service: Endoscopy;;  . BIOPSY  04/26/2014   Procedure: BIOPSIES;  Surgeon: Danie Binder, MD;  Location: [facility];  Service: Endoscopy;;  . BIOPSY  12/24/2018   Procedure: BIOPSY;  Surgeon: Daneil Dolin, MD;  [facility];  Service: Endoscopy;;  colon  . CHOLECYSTECTOMY    . COLONOSCOPY     in [place]  . COLONOSCOPY WITH PROPOFOL N/A 05/20/2013   Dr.Rourk- inadequate prep, normal appearing rectum, grossly normal colon aside from pancolonic diverticula, normal terminal ileum bx= unremarkable colonic mucosa. Due for early interval 2016.   Marland Kitchen COLONOSCOPY WITH PROPOFOL N/A 04/26/2014   [id number] ileum/one colon polyp removed/moderate pan-colonic diverticulosis/large internal hemorrhoids  . COLONOSCOPY WITH PROPOFOL N/A 12/23/2014   Dr.Rourk-  minimal internal hemorrhoids, pancolonic diverticulosis  . COLONOSCOPY WITH PROPOFOL N/A 08/23/2016   Procedure: COLONOSCOPY WITH PROPOFOL;  Surgeon: Danie Binder, MD;  [facility];  Service: Endoscopy;  Laterality: N/A;  . COLONOSCOPY WITH PROPOFOL N/A 12/24/2018   Pancolonic diverticulosis, normal TI, one 5 mm polyp in rectum (tubular adenoma). Somewhat friable hemorrhagic mucosa in area of IC valve/cecum, possibly related to scope trauma s/p biopsy.   . DEBRIDEMENT OF ABDOMINAL WALL ABSCESS N/A 02/08/2013   Procedure: DEBRIDEMENT OF ABDOMINAL WALL ABSCESS;  Surgeon: Jamesetta So, MD;  Location: [facility];  Service: General;  Laterality: N/A;  . ESOPHAGOGASTRODUODENOSCOPY (EGD) WITH PROPOFOL N/A 05/20/2013   Dr.Rourk- s/p prior gastric surgery with normal esophagus, residual gastric mucosa and patent efferent limb  . ESOPHAGOGASTRODUODENOSCOPY (EGD) WITH PROPOFOL N/A 02/03/2014   Dr. Gala Romney:  s/p hemigastrectomy with retained gastric contents. Residual gastric mucosa and efferent limb appeared normal otherwise. Query gastroparesis.   Marland Kitchen ESOPHAGOGASTRODUODENOSCOPY (EGD) WITH PROPOFOL N/A 04/26/2014   [id number] ring/small HH/mild non-erosive gasrtitis/normal anastomosis  . ESOPHAGOGASTRODUODENOSCOPY (EGD) WITH PROPOFOL N/A 12/23/2014   Dr.Rourk- s/p prior hemigastrctomy, active oozing from anastomotic suture site, hemostasis achieved  . ESOPHAGOGASTRODUODENOSCOPY (EGD) WITH PROPOFOL N/A 05/23/2016   Dr. 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 ESOPHAGOGASTRODUODENOSCOPY (EGD) WITH PROPOFOL N/A 05/11/2017   Procedure: ESOPHAGOGASTRODUODENOSCOPY (EGD) WITH PROPOFOL;  Surgeon: Daneil Dolin, MD;  [facility];  Service: Endoscopy;  Laterality: N/A;  . HEMORRHOID SURGERY N/A 06/18/2017   Procedure: THREE COLUMN EXTENSIVE HEMORRHOIDECTOMY;  Surgeon: Virl Cagey, MD;  Location: [facility];  Service: General;  Laterality: N/A;  . INCISION AND DRAINAGE ABSCESS N/A 01/06/2017   Procedure: INCISION AND DRAINAGE ABDOMINAL WALL ABSCESS;  Surgeon: Aviva Signs, MD;  Location: [facility];  Service: General;  Laterality: N/A;  . POLYPECTOMY  12/24/2018   Procedure: POLYPECTOMY;  Surgeon: Daneil Dolin, MD;  [facility];  Service: Endoscopy;;  colon  . tendon repar Right    wrist  . WOUND EXPLORATION Right 06/24/2013   Procedure: exploration of traumatic wound right wrist;  Surgeon: Tennis Must, MD;  Location: [place];  Service: Orthopedics;  Laterality: Right;    Family History  Problem Relation Age of Onset  . Brain cancer Son   . Schizophrenia Son   . Cancer Son        brain  . Lung cancer Father   . Cancer Father        mets  . Drug abuse Mother   . Breast cancer Maternal Aunt   . Bipolar disorder  Maternal Aunt   . Drug abuse Maternal Aunt   . Colon cancer Maternal Grandmother        late 58s, early 59s  . Drug abuse Sister   . Drug abuse Brother   . Bipolar disorder Paternal Grandfather   . Bipolar disorder Cousin   . Liver disease Neg Hx    Social History:  reports that she has been smoking cigarettes. She has a 17.50 pack-year smoking history. She has never used smokeless tobacco. She reports previous alcohol use. She reports previous drug use. Drug: Cocaine.  Allergies:  Allergies  Allergen Reactions  . Codeine Hives  . Morphine And Related Hives  . Reglan [Metoclopramide] Other (See Comments)    EPS symptoms     Home Medications:  No current facility-administered medications on file prior to encounter.   Current Outpatient Medications on File Prior to Encounter  Medication Sig Dispense Refill  . acetaminophen (TYLENOL) 500 MG tablet Take 2 tablets (1,000 mg total) by mouth 3 (three) times daily as needed for mild pain or moderate pain. (Patient not taking: Reported on 05/09/2019) 30 tablet 0  . amLODipine (NORVASC) 10 MG tablet Take 0.5  tablets (5 mg total) by mouth daily. (Patient taking differently: Take 10 mg by mouth daily. ) 30 tablet 0  . atenolol (TENORMIN) 50 MG tablet Take 50 mg by mouth daily.    . cyclobenzaprine (FLEXERIL) 5 MG tablet Take 1 tablet (5 mg total) by mouth 3 (three) times daily as needed for muscle spasms. (Patient not taking: Reported on 05/09/2019) 30 tablet 0  . dicyclomine (BENTYL) 10 MG capsule Take 1 capsule (10 mg total) by mouth 4 (four) times daily -  before meals and at bedtime. For diarrhea and abdominal cramps (Patient not taking: Reported on 05/09/2019) 120 capsule 1  . escitalopram (LEXAPRO) 20 MG tablet Take 20 mg daily by mouth.    . folic acid (FOLVITE) 1 MG tablet Take 1 tablet (1 mg total) by mouth daily. (Patient not taking: Reported on 05/09/2019) 30 tablet 2  . ibuprofen (ADVIL) 800 MG tablet Take 800 mg by mouth every 8 (eight) hours as needed for moderate pain.     . mupirocin cream (BACTROBAN) 2 % Apply topically 2 (two) times daily. (Patient not taking: Reported on 05/09/2019) 15 g 0  . omeprazole (PRILOSEC) 20 MG capsule Take 1 capsule (20 mg total) by mouth daily for 14 days. 14 capsule 0  . ondansetron (ZOFRAN ODT) 4 MG disintegrating tablet Take 1 tablet (4 mg total) by mouth every 8 (eight) hours as needed for nausea or vomiting. 10 tablet 0  . ondansetron (ZOFRAN) 4 MG tablet Take 1 tablet (4 mg total) by mouth every 6 (six) hours as needed for nausea. 30 tablet 0  . pantoprazole (PROTONIX) 40 MG tablet Take 40 mg by mouth daily.    . promethazine (PHENERGAN) 25 MG suppository Place 1 suppository (25 mg total) rectally every 6 (six) hours as needed for nausea or vomiting. (Patient not taking: Reported on 05/09/2019) 12 each 0  . thiamine 100 MG tablet Take 1 tablet (100 mg total) by mouth daily. (Patient not taking: Reported on 05/09/2019) 30 tablet 2  . traMADol (ULTRAM) 50 MG tablet Take 1 tablet (50 mg total) by mouth every 12 (twelve) hours as needed for up to 15 doses. (Patient  not taking: Reported on 05/09/2019) 15 tablet 0  . vitamin B-12 (CYANOCOBALAMIN) 1000 MCG tablet Take 1,000 mcg by mouth daily.  ROS: Unable to obtain due to global aphasia.   Physical Examination: There were no vitals taken for this visit.  HEENT: [place]/AT Lungs: Respirations unlabored Abdomen: Extensive scarring from prior operations Ext: No edema  Neurologic Examination: Mental Status: Awake and alert. Waxing and waning demonstration of verbal ability. She is initially mute, nods head to all questions, but does not follow any commands. In CT, she briefly was able to utter short phrases regarding her symptoms of right sided weakness and severe right sided headache. Following her initial CT scan she was also able to follow all commands. She then became mute again, but continued to be able to follow motor commands.  Cranial Nerves: II:  Fixates on examiner's face and tracks to left and right. PERRL.  III,IV, VI: Tracks horizontally. No nystagmus. No forced gaze deviation. No ptosis.  V,VII: Will not smile or grimace to command. Grossly symmetric. Endorses inability to feel FT on the right.  VIII: hearing intact to voice.  IX,X: Unable to assess XI: Head is midline.  XII: Will not protrude tongue to command.  Motor: RUE: Will not move to command. Reflexive grip transiently noted when examiner clasped her hand.  RLE: Will not move to command. However, when she was alone in the CT scanner, she moved her RLE to and fro as well as flexing at the knee and hip with what appeared to be normal strength.  LUE 5/5 LLE 5/5 Sensory: Subjectively insensate to FT RUE and RLE Deep Tendon Reflexes:  1+ bilateral brachioradialis, biceps and patellae. 0 achilles bilaterally.  Plantars: Mute bilaterally Cerebellar: Normal FNF on the left. Will not perform on the right.  Gait: Deferred   Results for orders placed or performed during the hospital encounter of 05/16/19 (from the past 48 hour(s))  CBG  monitoring, ED     Status: Abnormal   Collection Time: 05/16/19  7:10 PM  Result Value Ref Range   Glucose-Capillary 110 (H) 70 - 99 mg/dL    Comment: Glucose reference range applies only to samples taken after fasting for at least 8 hours.   No results found.  Assessment: 54 y.o. female presenting with acute onset of right hemiplegia, right sided pain and global aphasia. LKN 11 AM.  1. Exam reveals several inconsistencies fitting a pattern that is most consistent with psychogenic etiology 2. CT head is normal. 3. CTA head and neck: No LVO. Minimal atherosclerosis without large vessel occlusion or significant stenosis in the head and neck 4. Stroke Risk Factors - Lupus, sickle cell trait, smoking, history of cocaine use and HTN.   Plan: 1. No further neurological work up is indicated.  2. Obtain psychology consultation if motor and speech symptoms do not resolve 3. Headache management per ED team.  4. Start ASA 81 mg po qd.  5. Smoking cessation   @Electronically  signed: Dr. Kerney Elbe  05/16/2019, 7:16 PM

## 2019-05-16 NOTE — ED Triage Notes (Addendum)
Pt presents from home as a code stroke, LSW 1100. Pt called boyfriend at 14, complaining of HA and vision changes. When boyfriend arrived home, found pt nonverbal, unable to move RUE and RLE and with facial droop. Pt did not speak during transport but did respond at the bridge of ED. H/o lupus, CA, 3 strokes (last stroke this past week). CBG190, , 126/96,84bpm,86%RA.  During exam at bridge, pt did not respond to commands, unable to move R side. Improvements noted in CT. See NIH scores

## 2019-05-16 NOTE — ED Notes (Addendum)
Radonna Ricker - boyfriend - [id number]. Will be waiting outside for update

## 2019-05-16 NOTE — H&P (Signed)
History and Physical    Angel Price [id number] DOB: [birth year] DOA: 05/16/2019  PCP: Rosita Fire, MD Patient coming from: Home  Chief Complaint: Right-sided weakness  HPI: Angel Price is a 54 y.o. 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  Per triage note, when patient's boyfriend arrived home, she was found to be nonverbal and unable to move her right upper and lower extremity with facial droop.  Patient did not speak during transport but did respond at the bridge of the ED. when I entered the room, patient was nonverbal.  Soon after when nursing staff entered the room, she started speaking.  States her boyfriend called EMS because she could not move the right side of her body.  She is not sure what time this started.  Reports having prior strokes.  No additional history could be obtained from her.  ED Course: Vital signs stable.  WBC count 3.6. Bicarb 18, anion gap 16.  Blood glucose 113.  CT head negative for acute intracranial abnormality.  CT angiogram showing minimal atherosclerosis without evidence of LVO or significant stenosis in the head and neck.  Patient was again seen by neurology today and her presentation was felt to be psychogenic in nature.  Neurology recommended starting aspirin 81 mg daily given presence of stroke risk factors and recommended psychiatry consultation if her symptoms do not resolve.  Review of Systems:  All systems reviewed and apart from history of presenting illness, are negative.  Past Medical History:  Diagnosis Date  . Abscess    soft tissue  . Adrenal mass  ([place])   . Alcohol abuse   . Anxiety   . Blood transfusion without reported diagnosis   . Chronic abdominal pain   . Chronic wound infection of abdomen   . Colon polyp    colonoscopy 04/2014  . Depression   . Diverticulosis    colonoscopy 04/2014 moderat pan colonic  . Drug-seeking behavior   . Gastritis    EGD 05/2014  . Gastroparesis Nov 2015  . GERD (gastroesophageal reflux disease)   . Hemorrhoid    internal large  . Hiatal hernia   . History of Billroth II operation   . Hypertension   . Lung nodule    CT 02/2014 needs repeat 1 month  . Lung nodule < 6cm on CT 04/25/2014  . Lupus ([place])   . Malingering   . Nausea and vomiting    chronic, recurrent  . Pancreatitis   . Schatzki's ring    patent per EGD 04/2014  . Sickle cell trait ([place])   . Suicide attempt ([place])   . 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 ABDOMINAL HYSTERECTOMY  2013   [place]  . ABDOMINAL SURGERY    . ADRENALECTOMY Right   . AGILE CAPSULE N/A 01/05/2015   Procedure: AGILE CAPSULE;  Surgeon: Daneil Dolin, MD;  [facility];  Service: Endoscopy;  Laterality: N/A;  0700  . Billroth II procedure      [place], first 2000, 2005/2006.  Marland Kitchen BIOPSY  05/20/2013   Procedure: BIOPSIES OF ASCENDING AND SIGMOID COLON;  Surgeon: Daneil Dolin, MD;  Location:  [facility];  Service: Endoscopy;;  . BIOPSY  04/26/2014   Procedure: BIOPSIES;  Surgeon: Danie Binder, MD;  Location: [facility];  Service: Endoscopy;;  . BIOPSY  12/24/2018   Procedure: BIOPSY;  Surgeon: Daneil Dolin, MD;  [facility];  Service: Endoscopy;;  colon  . CHOLECYSTECTOMY    . COLONOSCOPY     in [place]  . COLONOSCOPY WITH PROPOFOL N/A 05/20/2013   Dr.Rourk- inadequate prep, normal appearing rectum, grossly normal colon aside from pancolonic diverticula, normal terminal ileum bx= unremarkable colonic mucosa. Due for early interval 2016.   Marland Kitchen COLONOSCOPY WITH PROPOFOL N/A  04/26/2014   [id number] ileum/one colon polyp removed/moderate pan-colonic diverticulosis/large internal hemorrhoids  . COLONOSCOPY WITH PROPOFOL N/A 12/23/2014   Dr.Rourk- minimal internal hemorrhoids, pancolonic diverticulosis  . COLONOSCOPY WITH PROPOFOL N/A 08/23/2016   Procedure: COLONOSCOPY WITH PROPOFOL;  Surgeon: Danie Binder, MD;  [facility];  Service: Endoscopy;  Laterality: N/A;  . COLONOSCOPY WITH PROPOFOL N/A 12/24/2018   Pancolonic diverticulosis, normal TI, one 5 mm polyp in rectum (tubular adenoma). Somewhat friable hemorrhagic mucosa in area of IC valve/cecum, possibly related to scope trauma s/p biopsy.   . DEBRIDEMENT OF ABDOMINAL WALL ABSCESS N/A 02/08/2013   Procedure: DEBRIDEMENT OF ABDOMINAL WALL ABSCESS;  Surgeon: Jamesetta So, MD;  Location: [facility];  Service: General;  Laterality: N/A;  . ESOPHAGOGASTRODUODENOSCOPY (EGD) WITH PROPOFOL N/A 05/20/2013   Dr.Rourk- s/p prior gastric surgery with normal esophagus, residual gastric mucosa and patent efferent limb  . ESOPHAGOGASTRODUODENOSCOPY (EGD) WITH PROPOFOL N/A 02/03/2014   Dr. Gala Romney:  s/p hemigastrectomy with retained gastric contents. Residual gastric mucosa and efferent limb appeared normal otherwise. Query gastroparesis.   Marland Kitchen ESOPHAGOGASTRODUODENOSCOPY (EGD) WITH PROPOFOL N/A 04/26/2014   [id number] ring/small HH/mild non-erosive gasrtitis/normal anastomosis  . ESOPHAGOGASTRODUODENOSCOPY (EGD) WITH PROPOFOL N/A 12/23/2014   Dr.Rourk- s/p prior hemigastrctomy, active oozing from anastomotic suture site, hemostasis achieved  . ESOPHAGOGASTRODUODENOSCOPY (EGD) WITH PROPOFOL N/A 05/23/2016   Dr. 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 ESOPHAGOGASTRODUODENOSCOPY (EGD) WITH PROPOFOL N/A 05/11/2017   Procedure: ESOPHAGOGASTRODUODENOSCOPY (EGD) WITH PROPOFOL;  Surgeon: Daneil Dolin, MD;  [facility];   Service: Endoscopy;  Laterality: N/A;  . HEMORRHOID SURGERY N/A 06/18/2017   Procedure: THREE COLUMN EXTENSIVE HEMORRHOIDECTOMY;  Surgeon: Virl Cagey, MD;  Location: [facility];  Service: General;  Laterality: N/A;  . INCISION AND DRAINAGE ABSCESS N/A 01/06/2017   Procedure: INCISION AND DRAINAGE ABDOMINAL WALL ABSCESS;  Surgeon: Aviva Signs, MD;  Location[facility];  Service: General;  Laterality: N/A;  . POLYPECTOMY  12/24/2018   Procedure: POLYPECTOMY;  Surgeon: Daneil Dolin, MD;  [facility];  Service: Endoscopy;;  colon  . tendon repar Right    wrist  . WOUND EXPLORATION Right 06/24/2013   Procedure: exploration of traumatic wound right wrist;  Surgeon: Tennis Must, MD;  Location: [place];  Service: Orthopedics;  Laterality: Right;     reports that she has been smoking cigarettes. She has a 17.50 pack-year smoking history. She has never used smokeless tobacco. She reports previous alcohol use. She reports previous drug use. Drug: Cocaine.  Allergies  Allergen Reactions  . Codeine Hives  . Morphine And Related Hives  . Reglan [Metoclopramide] Other (See Comments)    EPS symptoms     Family History  Problem Relation Age of Onset  . Brain cancer Son   .  Schizophrenia Son   . Cancer Son        brain  . Lung cancer Father   . Cancer Father        mets  . Drug abuse Mother   . Breast cancer Maternal Aunt   . Bipolar disorder Maternal Aunt   . Drug abuse Maternal Aunt   . Colon cancer Maternal Grandmother        late 24s, early 11s  . Drug abuse Sister   . Drug abuse Brother   . Bipolar disorder Paternal Grandfather   . Bipolar disorder Cousin   . Liver disease Neg Hx     Prior to Admission medications   Medication Sig Start Date End Date Taking? Authorizing Provider  acetaminophen (TYLENOL) 500 MG tablet Take 2 tablets (1,000 mg total) by mouth 3 (three) times daily as needed for mild pain or moderate pain. 02/26/19   Enzo Bi, MD  amLODipine (NORVASC) 10  MG tablet Take 0.5 tablets (5 mg total) by mouth daily. Patient taking differently: Take 10 mg by mouth daily.  01/03/17   Doreatha Lew, MD  atenolol (TENORMIN) 50 MG tablet Take 50 mg by mouth daily. 02/01/19   [provider]  cyclobenzaprine (FLEXERIL) 5 MG tablet Take 1 tablet (5 mg total) by mouth 3 (three) times daily as needed for muscle spasms. 02/26/19   Enzo Bi, MD  dicyclomine (BENTYL) 10 MG capsule Take 1 capsule (10 mg total) by mouth 4 (four) times daily -  before meals and at bedtime. For diarrhea and abdominal cramps 12/09/18   Mahala Menghini, PA-C  escitalopram (LEXAPRO) 20 MG tablet Take 20 mg daily by mouth.    [provider]  folic acid (FOLVITE) 1 MG tablet Take 1 tablet (1 mg total) by mouth daily. 11/13/18   Roxan Hockey, MD  ibuprofen (ADVIL) 800 MG tablet Take 800 mg by mouth every 8 (eight) hours as needed for moderate pain.  04/15/19   [provider]  mupirocin cream (BACTROBAN) 2 % Apply topically 2 (two) times daily. 02/26/19   Enzo Bi, MD  omeprazole (PRILOSEC) 20 MG capsule Take 1 capsule (20 mg total) by mouth daily for 14 days. 01/27/19 02/23/19  McDonald, Mia A, PA-C  ondansetron (ZOFRAN ODT) 4 MG disintegrating tablet Take 1 tablet (4 mg total) by mouth every 8 (eight) hours as needed for nausea or vomiting. 05/09/19   Caccavale, Sophia, PA-C  ondansetron (ZOFRAN) 4 MG tablet Take 1 tablet (4 mg total) by mouth every 6 (six) hours as needed for nausea. [id number]   Delora Fuel, MD  pantoprazole (PROTONIX) 40 MG tablet Take 40 mg by mouth daily. 04/29/19   [provider]  promethazine (PHENERGAN) 25 MG suppository Place 1 suppository (25 mg total) rectally every 6 (six) hours as needed for nausea or vomiting. 01/27/19   McDonald, Mia A, PA-C  thiamine 100 MG tablet Take 1 tablet (100 mg total) by mouth daily. 11/13/18   Roxan Hockey, MD  traMADol (ULTRAM) 50 MG tablet Take 1 tablet (50 mg total) by mouth every 12  (twelve) hours as needed for up to 15 doses. 03/10/19   Curatolo, Adam, DO  vitamin B-12 (CYANOCOBALAMIN) 1000 MCG tablet Take 1,000 mcg by mouth daily.    [provider]    Physical Exam: Vitals:   05/16/19 2230 05/16/19 2245 05/16/19 2300 05/16/19 2315  BP: 101/78 113/83 108/73 123/85  Pulse: 82 84 98 100  Resp: 16 17 18  19  Temp:      TempSrc:      SpO2: 96% 94% 94% (!) 87%    Physical Exam  Constitutional: She is oriented to person, place, and time. She appears well-developed and well-nourished. No distress.  HENT:  Head: Normocephalic.  Eyes: Pupils are equal, round, and reactive to light. Right eye exhibits no discharge. Left eye exhibits no discharge.  Cardiovascular: Regular rhythm and intact distal pulses.  Slightly tachycardic  Pulmonary/Chest: Effort normal and breath sounds normal. No respiratory distress. She has no wheezes. She has no rales.  Abdominal: Soft. Bowel sounds are normal. She exhibits no distension. There is no abdominal tenderness. There is no guarding.  Mild purulent drainage from a small open abdominal wound.  No signs of cellulitis.  Musculoskeletal:        General: No edema.     Cervical back: Neck supple.  Neurological: She is alert and oriented to person, place, and time.  No slurring of speech, tongue midline, no facial droop Strength 5 out of 5 and sensation to light touch intact in the left upper and lower extremities. Decreased sensation to light touch in the right upper and lower extremities.  Patient was initially not able to move her right upper and lower extremity but later noted to have spontaneous movements.  Skin: Skin is warm and dry. She is not diaphoretic.     Labs on Admission: I have personally reviewed following labs and imaging studies  CBC: Recent Labs  Lab 05/13/19 0030 05/16/19 1914 05/16/19 1921  WBC 5.6 3.6*  --   NEUTROABS 2.0 1.6*  --   HGB 12.3 12.9 13.6  HCT 34.5* 36.5 40.0  MCV 81.9 80.4  --   PLT  329 344  --    Basic Metabolic Panel: Recent Labs  Lab 05/13/19 0030 05/16/19 1914 05/16/19 1921  NA 135 133* 134*  K 4.4 4.3 4.3  CL 101 99 101  CO2 24 18*  --   GLUCOSE 101* 113* 105*  BUN 10 6 7   CREATININE 0.64 0.70 0.90  CALCIUM 8.9 8.7*  --    GFR: Estimated Creatinine Clearance: 61.2 mL/min (by C-G formula based on SCr of 0.9 mg/dL). Liver Function Tests: Recent Labs  Lab 05/13/19 0030 05/16/19 1914  AST 26 44*  ALT 22 37  ALKPHOS 74 65  BILITOT 0.5 0.8  PROT 7.8 7.4  ALBUMIN 3.8 3.8   Recent Labs  Lab 05/13/19 0030  LIPASE 18   No results for input(s): AMMONIA in the last 168 hours. Coagulation Profile: Recent Labs  Lab 05/16/19 1914  INR 1.4*   Cardiac Enzymes: No results for input(s): CKTOTAL, CKMB, CKMBINDEX, TROPONINI in the last 168 hours. BNP (last 3 results) No results for input(s): PROBNP in the last 8760 hours. HbA1C: No results for input(s): HGBA1C in the last 72 hours. CBG: Recent Labs  Lab 05/16/19 1910  GLUCAP 110*   Lipid Profile: No results for input(s): CHOL, HDL, LDLCALC, TRIG, CHOLHDL, LDLDIRECT in the last 72 hours. Thyroid Function Tests: No results for input(s): TSH, T4TOTAL, FREET4, T3FREE, THYROIDAB in the last 72 hours. Anemia Panel: No results for input(s): VITAMINB12, FOLATE, FERRITIN, TIBC, IRON, RETICCTPCT in the last 72 hours. Urine analysis:    Component Value Date/Time   COLORURINE STRAW (A) 05/09/2019 0400   APPEARANCEUR CLEAR 05/09/2019 0400   LABSPEC 1.006 05/09/2019 0400   PHURINE 5.0 05/09/2019 0400   GLUCOSEU NEGATIVE 05/09/2019 0400   HGBUR NEGATIVE 05/09/2019 0400   BILIRUBINUR NEGATIVE  05/09/2019 0400   KETONESUR NEGATIVE 05/09/2019 0400   PROTEINUR NEGATIVE 05/09/2019 0400   UROBILINOGEN 0.2 11/26/2014 0915   NITRITE NEGATIVE 05/09/2019 0400   LEUKOCYTESUR NEGATIVE 05/09/2019 0400    Radiological Exams on Admission: CT Code Stroke CTA Head W/WO contrast  Result Date: 05/16/2019 CLINICAL  DATA:  Right-sided weakness and aphasia. EXAM: CT ANGIOGRAPHY HEAD AND NECK TECHNIQUE: Multidetector CT imaging of the head and neck was performed using the standard protocol during bolus administration of intravenous contrast. Multiplanar CT image reconstructions and MIPs were obtained to evaluate the vascular anatomy. Carotid stenosis measurements (when applicable) are obtained utilizing NASCET criteria, using the distal internal carotid diameter as the denominator. CONTRAST:  123mL OMNIPAQUE IOHEXOL 350 MG/ML SOLN COMPARISON:  12/20/2016 FINDINGS: CTA NECK FINDINGS Aortic arch: Normal variant aortic arch branching pattern with common origin of the brachiocephalic and left common carotid arteries. Minimal atherosclerotic calcification in the aortic arch. Widely patent arch vessel origins. Right carotid system: Patent and smooth without evidence of stenosis or dissection. Left carotid system: Patent and smooth without evidence of stenosis or dissection. Vertebral arteries: Patent, smooth, and codominant without evidence of stenosis or dissection. Skeleton: Moderate disc degeneration at C5-6. Other neck: No evidence of cervical lymphadenopathy or mass. Upper chest: Clear lung apices. Review of the MIP images confirms the above findings CTA HEAD FINDINGS Anterior circulation: The internal carotid arteries are patent from skull base to carotid termini with minimal nonstenotic plaque bilaterally. ACAs and MCAs are patent without evidence of proximal branch occlusion or significant proximal stenosis. No aneurysm is identified. Posterior circulation: The intracranial vertebral arteries are widely patent to the basilar. Patent PICAs and SCAs are seen bilaterally. The basilar artery is widely patent. There are right larger than left posterior communicating arteries. Both PCAs are patent without evidence of significant proximal stenosis. No aneurysm is identified. Venous sinuses: As permitted by contrast timing, patent.  Anatomic variants: None. Review of the MIP images confirms the above findings IMPRESSION: 1. Minimal atherosclerosis without large vessel occlusion or significant stenosis in the head and neck. 2.  Aortic Atherosclerosis (ICD10-I70.0). These results were communicated to Dr. Cheral Marker at 7:50 pm on 05/16/2019 by text page via the [facility] messaging system. Electronically Signed   By: Logan Bores M.D.   On: 05/16/2019 19:55   CT Code Stroke CTA Neck W/WO contrast  Result Date: 05/16/2019 CLINICAL DATA:  Right-sided weakness and aphasia. EXAM: CT ANGIOGRAPHY HEAD AND NECK TECHNIQUE: Multidetector CT imaging of the head and neck was performed using the standard protocol during bolus administration of intravenous contrast. Multiplanar CT image reconstructions and MIPs were obtained to evaluate the vascular anatomy. Carotid stenosis measurements (when applicable) are obtained utilizing NASCET criteria, using the distal internal carotid diameter as the denominator. CONTRAST:  17mL OMNIPAQUE IOHEXOL 350 MG/ML SOLN COMPARISON:  12/20/2016 FINDINGS: CTA NECK FINDINGS Aortic arch: Normal variant aortic arch branching pattern with common origin of the brachiocephalic and left common carotid arteries. Minimal atherosclerotic calcification in the aortic arch. Widely patent arch vessel origins. Right carotid system: Patent and smooth without evidence of stenosis or dissection. Left carotid system: Patent and smooth without evidence of stenosis or dissection. Vertebral arteries: Patent, smooth, and codominant without evidence of stenosis or dissection. Skeleton: Moderate disc degeneration at C5-6. Other neck: No evidence of cervical lymphadenopathy or mass. Upper chest: Clear lung apices. Review of the MIP images confirms the above findings CTA HEAD FINDINGS Anterior circulation: The internal carotid arteries are patent from skull base to carotid termini  with minimal nonstenotic plaque bilaterally. ACAs and MCAs are patent without  evidence of proximal branch occlusion or significant proximal stenosis. No aneurysm is identified. Posterior circulation: The intracranial vertebral arteries are widely patent to the basilar. Patent PICAs and SCAs are seen bilaterally. The basilar artery is widely patent. There are right larger than left posterior communicating arteries. Both PCAs are patent without evidence of significant proximal stenosis. No aneurysm is identified. Venous sinuses: As permitted by contrast timing, patent. Anatomic variants: None. Review of the MIP images confirms the above findings IMPRESSION: 1. Minimal atherosclerosis without large vessel occlusion or significant stenosis in the head and neck. 2.  Aortic Atherosclerosis (ICD10-I70.0). These results were communicated to Dr. Cheral Marker at 7:50 pm on 05/16/2019 by text page via the [facility] messaging system. Electronically Signed   By: Logan Bores M.D.   On: 05/16/2019 19:55   CT HEAD CODE STROKE WO CONTRAST  Result Date: 05/16/2019 CLINICAL DATA:  Code stroke.  Right-sided weakness and aphasia. EXAM: CT HEAD WITHOUT CONTRAST TECHNIQUE: Contiguous axial images were obtained from the base of the skull through the vertex without intravenous contrast. COMPARISON:  Head MRI 05/09/2019 FINDINGS: Brain: There is no evidence of acute infarct, intracranial hemorrhage, mass, midline shift, or extra-axial fluid collection. A partially empty sella is unchanged. The ventricles and sulci are normal. Vascular: Calcified atherosclerosis at the skull base. No hyperdense vessel. Skull: No fracture or suspicious osseous lesion. Sinuses/Orbits: Visualized paranasal sinuses and mastoid air cells are clear. Orbits are unremarkable. Other: None. ASPECTS [facility] Stroke Program Early CT Score) - Ganglionic level infarction (caudate, lentiform nuclei, internal capsule, insula, M1-M3 cortex): 7 - Supraganglionic infarction (M4-M6 cortex): 3 Total score (0-10 with 10 being normal): 10 IMPRESSION: 1. No  evidence of acute intracranial abnormality. 2. ASPECTS is 10. These results were communicated to Dr. Cheral Marker at 7:26 pm on 05/16/2019 by text page via the [facility] messaging system. Electronically Signed   By: Logan Bores M.D.   On: 05/16/2019 19:26    EKG: Pending at this time.  Assessment/Plan Principal Problem:   Right sided weakness Active Problems:   Tobacco abuse   Essential hypertension, benign   Chronic wound infection of abdomen   Metabolic acidosis   Acute onset right-sided hemiplegia, global aphasia: Work-up not suggestive of acute stroke.  Inconsistencies noted on exam as mentioned above.  CT head negative for acute intracranial abnormality.  CT angiogram showing minimal atherosclerosis without evidence of LVO or significant stenosis in the head and neck. Patient was again seen by neurology today and her presentation was felt to be psychogenic in nature.  Neurology recommended starting aspirin 81 mg daily given presence of stroke risk factors and recommended psychiatry consultation if her symptoms do not resolve. Will admit for observation and place psychiatry consultation.  Start aspirin.  Chronically draining abdominal wall wounds with concern for possible infection: Per chart review, patient has a history of gastrectomy with Roux-en-Y anastomosis, multiple abdominal surgeries, and recurrent abdominal wall wound infections.  Currently noted to have mild purulent drainage from a small open abdominal wall wound. No signs of cellulitis.  No complaints of abdominal pain, nausea, or vomiting.  Abdominal exam benign.  No fever or leukocytosis.  However, I am concerned that the patient is slightly tachycardic at present.  Will start vancomycin given history of recurrent wound infections.  Give IV fluid bolus, wound care consult, and blood culture x2.  Will also check UDS given history of polysubstance abuse, which could also be a possible  explanation for her mild tachycardia.  Mild high anion  gap metabolic acidosis: Bicarb 18, anion gap 16.  Give IV fluid hydration.  Check ethanol, salicylate, and lactic acid levels.  Repeat BMP in a.m.  Current tobacco use: Smoking 1/2 pack of cigarettes daily.  Nicotine patch ordered.  Needs continued counseling on smoking cessation.  Hypertension: Currently normotensive.  Resume home medications for other chronic conditions after pharmacy medication reconciliation has been done.  DVT prophylaxis: Lovenox Code Status: Full code Family Communication: No family available at this time. Disposition Plan: Anticipate discharge in 1 to 2 days.  Pending psychiatry evaluation. Consults called: None Admission status: It is my clinical opinion that referral for OBSERVATION is reasonable and necessary in this patient based on the above information provided. The aforementioned taken together are felt to place the patient at high risk for further clinical deterioration. However it is anticipated that the patient may be medically stable for discharge from the hospital within 24 to 48 hours.  The medical decision making on this patient was of high complexity and the patient is at high risk for clinical deterioration, therefore this is a level 3 visit.  Shela Leff MD [facility]  If 7PM-7AM, please contact night-coverage [email or web address]  05/17/2019, 12:02 AM

## 2019-05-17 ENCOUNTER — Other Ambulatory Visit: Payer: Self-pay

## 2019-05-17 ENCOUNTER — Encounter (HOSPITAL_COMMUNITY): Payer: Self-pay | Admitting: Internal Medicine

## 2019-05-17 DIAGNOSIS — E872 Acidosis: Secondary | ICD-10-CM | POA: Diagnosis not present

## 2019-05-17 DIAGNOSIS — K219 Gastro-esophageal reflux disease without esophagitis: Secondary | ICD-10-CM | POA: Diagnosis not present

## 2019-05-17 DIAGNOSIS — Z800 Family history of malignant neoplasm of digestive organs: Secondary | ICD-10-CM | POA: Diagnosis not present

## 2019-05-17 DIAGNOSIS — F17210 Nicotine dependence, cigarettes, uncomplicated: Secondary | ICD-10-CM | POA: Diagnosis not present

## 2019-05-17 DIAGNOSIS — R531 Weakness: Secondary | ICD-10-CM | POA: Diagnosis not present

## 2019-05-17 DIAGNOSIS — D573 Sickle-cell trait: Secondary | ICD-10-CM | POA: Diagnosis present

## 2019-05-17 DIAGNOSIS — Y906 Blood alcohol level of 120-199 mg/100 ml: Secondary | ICD-10-CM | POA: Diagnosis present

## 2019-05-17 DIAGNOSIS — Z801 Family history of malignant neoplasm of trachea, bronchus and lung: Secondary | ICD-10-CM | POA: Diagnosis not present

## 2019-05-17 DIAGNOSIS — Z8673 Personal history of transient ischemic attack (TIA), and cerebral infarction without residual deficits: Secondary | ICD-10-CM | POA: Diagnosis not present

## 2019-05-17 DIAGNOSIS — X58XXXD Exposure to other specified factors, subsequent encounter: Secondary | ICD-10-CM | POA: Diagnosis present

## 2019-05-17 DIAGNOSIS — Z888 Allergy status to other drugs, medicaments and biological substances status: Secondary | ICD-10-CM | POA: Diagnosis not present

## 2019-05-17 DIAGNOSIS — Z720 Tobacco use: Secondary | ICD-10-CM | POA: Diagnosis not present

## 2019-05-17 DIAGNOSIS — Z90710 Acquired absence of both cervix and uterus: Secondary | ICD-10-CM | POA: Diagnosis not present

## 2019-05-17 DIAGNOSIS — Z86010 Personal history of colonic polyps: Secondary | ICD-10-CM | POA: Diagnosis not present

## 2019-05-17 DIAGNOSIS — Z818 Family history of other mental and behavioral disorders: Secondary | ICD-10-CM | POA: Diagnosis not present

## 2019-05-17 DIAGNOSIS — I10 Essential (primary) hypertension: Secondary | ICD-10-CM

## 2019-05-17 DIAGNOSIS — Z813 Family history of other psychoactive substance abuse and dependence: Secondary | ICD-10-CM | POA: Diagnosis not present

## 2019-05-17 DIAGNOSIS — F329 Major depressive disorder, single episode, unspecified: Secondary | ICD-10-CM | POA: Diagnosis present

## 2019-05-17 DIAGNOSIS — F54 Psychological and behavioral factors associated with disorders or diseases classified elsewhere: Secondary | ICD-10-CM | POA: Diagnosis not present

## 2019-05-17 DIAGNOSIS — Z803 Family history of malignant neoplasm of breast: Secondary | ICD-10-CM | POA: Diagnosis not present

## 2019-05-17 DIAGNOSIS — Z808 Family history of malignant neoplasm of other organs or systems: Secondary | ICD-10-CM | POA: Diagnosis not present

## 2019-05-17 DIAGNOSIS — Z9049 Acquired absence of other specified parts of digestive tract: Secondary | ICD-10-CM | POA: Diagnosis not present

## 2019-05-17 DIAGNOSIS — F10129 Alcohol abuse with intoxication, unspecified: Secondary | ICD-10-CM | POA: Diagnosis not present

## 2019-05-17 DIAGNOSIS — Z91011 Allergy to milk products: Secondary | ICD-10-CM | POA: Diagnosis not present

## 2019-05-17 DIAGNOSIS — G8191 Hemiplegia, unspecified affecting right dominant side: Secondary | ICD-10-CM | POA: Diagnosis not present

## 2019-05-17 DIAGNOSIS — U071 COVID-19: Secondary | ICD-10-CM | POA: Diagnosis not present

## 2019-05-17 DIAGNOSIS — E875 Hyperkalemia: Secondary | ICD-10-CM | POA: Diagnosis not present

## 2019-05-17 DIAGNOSIS — R4701 Aphasia: Secondary | ICD-10-CM | POA: Diagnosis not present

## 2019-05-17 LAB — PHOSPHORUS: Phosphorus: 2.6 mg/dL (ref 2.5–4.6)

## 2019-05-17 LAB — BASIC METABOLIC PANEL
Anion gap: 11 (ref 5–15)
BUN: 9 mg/dL (ref 6–20)
CO2: 17 mmol/L — ABNORMAL LOW (ref 22–32)
Calcium: 7.9 mg/dL — ABNORMAL LOW (ref 8.9–10.3)
Chloride: 113 mmol/L — ABNORMAL HIGH (ref 98–111)
Creatinine, Ser: 0.68 mg/dL (ref 0.44–1.00)
GFR calc Af Amer: >60 mL/min (ref >60)
GFR calc non Af Amer: >60 mL/min (ref >60)
Glucose, Bld: 77 mg/dL (ref 70–99)
Potassium: 3.9 mmol/L (ref 3.5–5.1)
Sodium: 141 mmol/L (ref 135–145)

## 2019-05-17 LAB — MAGNESIUM: Magnesium: 1.6 mg/dL — ABNORMAL LOW (ref 1.7–2.4)

## 2019-05-17 LAB — RAPID URINE DRUG SCREEN, HOSP PERFORMED
Amphetamines: NOT DETECTED
Barbiturates: NOT DETECTED
Benzodiazepines: NOT DETECTED
Cocaine: NOT DETECTED
Opiates: NOT DETECTED
Tetrahydrocannabinol: NOT DETECTED

## 2019-05-17 LAB — CBC
HCT: 33.4 % — ABNORMAL LOW (ref 36.0–46.0)
Hemoglobin: 11.5 g/dL — ABNORMAL LOW (ref 12.0–15.0)
MCH: 28.3 pg (ref 26.0–34.0)
MCHC: 34.4 g/dL (ref 30.0–36.0)
MCV: 82.3 fL (ref 80.0–100.0)
Platelets: 321 10*3/uL (ref 150–400)
RBC: 4.06 MIL/uL (ref 3.87–5.11)
RDW: 14.7 % (ref 11.5–15.5)
WBC: 6.8 10*3/uL (ref 4.0–10.5)
nRBC: 0 % (ref 0.0–0.2)

## 2019-05-17 LAB — COMPREHENSIVE METABOLIC PANEL
ALT: 27 U/L (ref 0–44)
AST: 35 U/L (ref 15–41)
Albumin: 3.4 g/dL — ABNORMAL LOW (ref 3.5–5.0)
Alkaline Phosphatase: 56 U/L (ref 38–126)
Anion gap: 10 (ref 5–15)
BUN: 6 mg/dL (ref 6–20)
CO2: 21 mmol/L — ABNORMAL LOW (ref 22–32)
Calcium: 8.1 mg/dL — ABNORMAL LOW (ref 8.9–10.3)
Chloride: 105 mmol/L (ref 98–111)
Creatinine, Ser: 0.78 mg/dL (ref 0.44–1.00)
GFR calc Af Amer: >60 mL/min (ref >60)
GFR calc non Af Amer: >60 mL/min (ref >60)
Glucose, Bld: 118 mg/dL — ABNORMAL HIGH (ref 70–99)
Potassium: 3.3 mmol/L — ABNORMAL LOW (ref 3.5–5.1)
Sodium: 136 mmol/L (ref 135–145)
Total Bilirubin: 0.6 mg/dL (ref 0.3–1.2)
Total Protein: 6.6 g/dL (ref 6.5–8.1)

## 2019-05-17 LAB — SARS CORONAVIRUS 2 (TAT 6-24 HRS): SARS Coronavirus 2: POSITIVE — AB

## 2019-05-17 LAB — LACTIC ACID, PLASMA
Lactic Acid, Venous: 2.7 mmol/L (ref 0.5–1.9)
Lactic Acid, Venous: 3.1 mmol/L (ref 0.5–1.9)
Lactic Acid, Venous: 3.9 mmol/L (ref 0.5–1.9)

## 2019-05-17 LAB — SALICYLATE LEVEL: Salicylate Lvl: <7 mg/dL — ABNORMAL LOW (ref 7.0–30.0)

## 2019-05-17 LAB — ETHANOL: Alcohol, Ethyl (B): 198 mg/dL — ABNORMAL HIGH (ref <10)

## 2019-05-17 MED ORDER — NICOTINE 14 MG/24HR TD PT24
14.0000 mg | MEDICATED_PATCH | Freq: Every day | TRANSDERMAL | Status: DC
  Administered 2019-05-17 – 2019-05-18 (×2): 14 mg via TRANSDERMAL
  Filled 2019-05-17 (×2): qty 1

## 2019-05-17 MED ORDER — FUROSEMIDE 10 MG/ML IJ SOLN
20.0000 mg | Freq: Once | INTRAMUSCULAR | Status: AC
  Administered 2019-05-17: 20 mg via INTRAVENOUS
  Filled 2019-05-17: qty 2

## 2019-05-17 MED ORDER — STERILE WATER FOR INJECTION IV SOLN
INTRAVENOUS | Status: AC
  Filled 2019-05-17: qty 850

## 2019-05-17 MED ORDER — THIAMINE HCL 100 MG PO TABS
100.0000 mg | ORAL_TABLET | Freq: Every day | ORAL | Status: DC
  Administered 2019-05-17 – 2019-05-18 (×2): 100 mg via ORAL
  Filled 2019-05-17 (×2): qty 1

## 2019-05-17 MED ORDER — LORAZEPAM 2 MG/ML IJ SOLN
1.0000 mg | INTRAMUSCULAR | Status: DC | PRN
  Administered 2019-05-18: 2 mg via INTRAVENOUS
  Filled 2019-05-17: qty 1

## 2019-05-17 MED ORDER — NON FORMULARY: 6.0000 mg | Freq: Every evening | Status: DC | PRN

## 2019-05-17 MED ORDER — SODIUM CHLORIDE 0.9 % IV SOLN: INTRAVENOUS | Status: DC

## 2019-05-17 MED ORDER — HYDROCODONE-ACETAMINOPHEN 5-325 MG PO TABS
1.0000 | ORAL_TABLET | Freq: Once | ORAL | Status: AC
  Administered 2019-05-17: 1 via ORAL
  Filled 2019-05-17: qty 1

## 2019-05-17 MED ORDER — ADULT MULTIVITAMIN W/MINERALS CH
1.0000 | ORAL_TABLET | Freq: Every day | ORAL | Status: DC
  Administered 2019-05-17 – 2019-05-18 (×2): 1 via ORAL
  Filled 2019-05-17 (×2): qty 1

## 2019-05-17 MED ORDER — SODIUM CHLORIDE 0.9 % IV BOLUS
500.0000 mL | Freq: Once | INTRAVENOUS | Status: AC
  Administered 2019-05-17: 500 mL via INTRAVENOUS

## 2019-05-17 MED ORDER — DIPHENHYDRAMINE HCL 25 MG PO CAPS
25.0000 mg | ORAL_CAPSULE | Freq: Once | ORAL | Status: AC
  Administered 2019-05-17: 25 mg via ORAL
  Filled 2019-05-17: qty 1

## 2019-05-17 MED ORDER — ONDANSETRON HCL 4 MG/2ML IJ SOLN
4.0000 mg | Freq: Four times a day (QID) | INTRAMUSCULAR | Status: DC | PRN
  Administered 2019-05-17 (×3): 4 mg via INTRAVENOUS
  Filled 2019-05-17 (×4): qty 2

## 2019-05-17 MED ORDER — MUPIROCIN 2 % EX OINT
TOPICAL_OINTMENT | Freq: Two times a day (BID) | CUTANEOUS | Status: DC
  Filled 2019-05-17: qty 22

## 2019-05-17 MED ORDER — VITAMIN B-12 1000 MCG PO TABS
1000.0000 ug | ORAL_TABLET | Freq: Every day | ORAL | Status: DC
  Administered 2019-05-17 – 2019-05-18 (×2): 1000 ug via ORAL
  Filled 2019-05-17 (×2): qty 1

## 2019-05-17 MED ORDER — HYDROCODONE-ACETAMINOPHEN 5-325 MG PO TABS
1.0000 | ORAL_TABLET | Freq: Four times a day (QID) | ORAL | Status: DC | PRN
  Administered 2019-05-17 (×2): 1 via ORAL
  Administered 2019-05-17: 2 via ORAL
  Administered 2019-05-18: 1 via ORAL
  Filled 2019-05-17: qty 1
  Filled 2019-05-17: qty 2
  Filled 2019-05-17 (×2): qty 1

## 2019-05-17 MED ORDER — SODIUM CHLORIDE 0.9 % IV BOLUS
1000.0000 mL | Freq: Once | INTRAVENOUS | Status: AC
  Administered 2019-05-17: 1000 mL via INTRAVENOUS

## 2019-05-17 MED ORDER — HYDRALAZINE HCL 20 MG/ML IJ SOLN: 5.0000 mg | Freq: Four times a day (QID) | INTRAMUSCULAR | Status: DC | PRN

## 2019-05-17 MED ORDER — ESCITALOPRAM OXALATE 20 MG PO TABS
20.0000 mg | ORAL_TABLET | Freq: Every day | ORAL | Status: DC
  Administered 2019-05-17 – 2019-05-18 (×2): 20 mg via ORAL
  Filled 2019-05-17 (×2): qty 1

## 2019-05-17 MED ORDER — LACTATED RINGERS IV SOLN: INTRAVENOUS | Status: DC

## 2019-05-17 MED ORDER — THIAMINE HCL 100 MG/ML IJ SOLN
100.0000 mg | Freq: Every day | INTRAMUSCULAR | Status: DC
  Filled 2019-05-17: qty 2

## 2019-05-17 MED ORDER — FOLIC ACID 1 MG PO TABS
1.0000 mg | ORAL_TABLET | Freq: Every day | ORAL | Status: DC
  Administered 2019-05-17 – 2019-05-18 (×2): 1 mg via ORAL
  Filled 2019-05-17 (×2): qty 1

## 2019-05-17 MED ORDER — PANTOPRAZOLE SODIUM 40 MG PO TBEC
40.0000 mg | DELAYED_RELEASE_TABLET | Freq: Every day | ORAL | Status: DC
  Administered 2019-05-17 – 2019-05-18 (×2): 40 mg via ORAL
  Filled 2019-05-17 (×2): qty 1

## 2019-05-17 MED ORDER — AMLODIPINE BESYLATE 5 MG PO TABS
5.0000 mg | ORAL_TABLET | Freq: Every day | ORAL | Status: DC
  Administered 2019-05-17 – 2019-05-18 (×2): 5 mg via ORAL
  Filled 2019-05-17 (×2): qty 1

## 2019-05-17 MED ORDER — VANCOMYCIN HCL 750 MG/150ML IV SOLN
750.0000 mg | INTRAVENOUS | Status: DC
  Administered 2019-05-17: 750 mg via INTRAVENOUS
  Filled 2019-05-17 (×2): qty 150

## 2019-05-17 MED ORDER — LORAZEPAM 1 MG PO TABS: 1.0000 mg | ORAL_TABLET | ORAL | Status: DC | PRN

## 2019-05-17 MED ORDER — ATENOLOL 50 MG PO TABS
50.0000 mg | ORAL_TABLET | Freq: Every day | ORAL | Status: DC
  Administered 2019-05-17 – 2019-05-18 (×2): 50 mg via ORAL
  Filled 2019-05-17 (×2): qty 1

## 2019-05-17 MED ORDER — MELATONIN 3 MG PO TABS
6.0000 mg | ORAL_TABLET | Freq: Every evening | ORAL | Status: DC | PRN
  Administered 2019-05-17: 6 mg via ORAL
  Filled 2019-05-17 (×2): qty 2

## 2019-05-17 NOTE — Progress Notes (Signed)
Received a call from Lab that pt's COVID Test is POSITIVE, NP Arby Barrette paged to be notified, awaiting a call back. [place]-Asidi, Angel Price

## 2019-05-17 NOTE — Progress Notes (Signed)
Pt ordered to be transferred to 5W, report called and given to Nurse Marissa and pt transferred to 5W21 at 0425, was however reassured. Obasogie-Asidi, Fabien Travelstead Efe

## 2019-05-17 NOTE — Progress Notes (Signed)
Messaged provider requesting to given second tab of Norco and PRN nausea medicine.

## 2019-05-17 NOTE — Consult Note (Signed)
[place] [facility] Nurse Consult Note: Reason for Consult:Chronic nonhealing wound to abdomen at former surgical site.  S/P chronic infections to abdominal wall at this site.   Wound type:see above Pressure Injury POA: NA Measurement: scarring to midline abdomen with 2 cm x 1 cm x 0.3 cm opening.   Wound bed: pale pink nongranulating  Moist  Drainage (amount, consistency, odor) minimal serosanguinous  Patient reports purulence none noted  No odor.  Periwound: scarring  Uneven skin folds.  Dressing procedure/placement/frequency: Cleanse abdominal wound with NS and pat dry. Apply mupirocin ointment to wound bed.  Cover with dry gauze and tape. Change each shift.  Will not follow at this time.  Please re-consult if needed.  Domenic Moras MSN, RN, FNP-BC CWON Wound, Ostomy, Continence Nurse Pager [phone]

## 2019-05-17 NOTE — Progress Notes (Signed)
Pharmacy Antibiotic Note  Angel Price is a 54 y.o. female admitted on 05/16/2019 with chronic abdominal wounds/abscess .  Pharmacy has been consulted for Vancomycin dosing. WBC 3.6. Renal function good.   Plan: Vancomycin 750 mg IV q24h >>Estimated AUC: 537 Trend WBC, temp, renal function  F/U infectious work-up Drug levels as indicated  Temp (24hrs), Avg:98.5 F (36.9 C), Min:98.5 F (36.9 C), Max:98.5 F (36.9 C)  Recent Labs  Lab 05/13/19 0030 05/16/19 1914 05/16/19 1921  WBC 5.6 3.6*  --   CREATININE 0.64 0.70 0.90    Estimated Creatinine Clearance: 61.2 mL/min (by C-G formula based on SCr of 0.9 mg/dL).    Allergies  Allergen Reactions  . Codeine Hives  . Morphine And Related Hives  . Reglan [Metoclopramide] Other (See Comments)    EPS symptoms     Narda Bonds, PharmD, BCPS Clinical Pharmacist Phone: [phone]

## 2019-05-17 NOTE — Progress Notes (Signed)
Pt admitted from the ED with the c/o of stroke like symptoms, pt alert and oriented with c/o of stomach pain, pt settled in bed with call light at bedside, tele monitor put and verified on pt, was however reassured and will continue to monitor, safety concern addressed accordingly. Obasogie-Asidi, Pearlie Lafosse Efe

## 2019-05-17 NOTE — ED Notes (Signed)
Called report to receiving nurse.

## 2019-05-17 NOTE — Consult Note (Signed)
[facility] Consult   Reason for Consult:  "Presenting with strokelike symptoms, work-up negative. Neurology feels this is psychogenic." Referring Physician:  Dr Waldron Labs Patient Identification: Angel Price MRN:  [id number] Principal Diagnosis: Right sided weakness Diagnosis:  Principal Problem:   Right sided weakness Active Problems:   Tobacco abuse   Essential hypertension, benign   Chronic wound infection of abdomen   Metabolic acidosis   Total Time spent with patient: 30 minutes  Subjective:   Angel Price is a 54 y.o. female patient.  Patient assessed by nurse practitioner.  Patient alert and oriented, answers appropriately.  Patient states "I came to the hospital because my right side was weak and I felt like I could not see for a minute."  Patient reports vision feels normal today but "my right side is still weak." Patient denies suicidal and homicidal ideations.  Patient denies self-harm.  Patient denies 7 prior suicide attempts, last attempt in 2015.  Patient denies access to weapons.  Patient denies homicidal ideations.  Patient denies auditory and visual hallucinations. Patient reports she lives at home with her boyfriend.  Patient reports feeling safe at home.  Patient reports she is currently disabled receiving disability benefits.  Patient denies substance use.  Patient reports alcohol use states "I drink 2 beers approximately 3 times per week." Patient reports average appetite.  Patient reports "I do not sleep good, I sleep about 3 hours at night and 2 hours during the day."  Patient reports use of over-the-counter sleep aid without resolution. Patient denies outpatient psychiatry at this time.  Patient reports psychiatric medications managed by primary care provider.  Patient reports stable on Lexapro "for a long time."  HPI: Patient admitted with new onset right-sided weakness.  Past Psychiatric History: Schizoaffective disorder, cocaine abuse,  bipolar disorder, personality disorder, alcohol abuse, major depressive disorder  Risk to Self:  Denies Risk to Others:  Denies Prior Inpatient Therapy:  Yes Prior Outpatient Therapy:  Yes  Past Medical History:  Past Medical History:  Diagnosis Date  . Abscess    soft tissue  . Adrenal mass ([place])   . Alcohol abuse   . Anxiety   . Blood transfusion without reported diagnosis   . Chronic abdominal pain   . Chronic wound infection of abdomen   . Colon polyp    colonoscopy 04/2014  . Depression   . Diverticulosis    colonoscopy 04/2014 moderat pan colonic  . Drug-seeking behavior   . Gastritis    EGD 05/2014  . Gastroparesis Nov 2015  . GERD (gastroesophageal reflux disease)   . Hemorrhoid    internal large  . Hiatal hernia   . History of Billroth II operation   . Hypertension   . Lung nodule    CT 02/2014 needs repeat 1 month  . Lung nodule < 6cm on CT 04/25/2014  . Lupus ([place])   . Malingering   . Nausea and vomiting    chronic, recurrent  . Pancreatitis   . Schatzki's ring    patent per EGD 04/2014  . Sickle cell trait ([place])   . Suicide attempt ([place])   . 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 ABDOMINAL HYSTERECTOMY  2013   [place]  . ABDOMINAL SURGERY    . ADRENALECTOMY Right   . AGILE CAPSULE N/A 01/05/2015   Procedure: AGILE CAPSULE;  Surgeon: Daneil Dolin, MD;  [facility];  Service: Endoscopy;  Laterality: N/A;  0700  . Billroth II procedure      [place], first 2000, 2005/2006.  Marland Kitchen BIOPSY  05/20/2013   Procedure: BIOPSIES OF ASCENDING AND SIGMOID COLON;  Surgeon: Daneil Dolin, MD;  Location: [facility];  Service: Endoscopy;;  . BIOPSY  04/26/2014   Procedure: BIOPSIES;  Surgeon: Danie Binder, MD;  Location: [facility];  Service: Endoscopy;;  . BIOPSY  12/24/2018   Procedure: BIOPSY;  Surgeon: Daneil Dolin, MD;  [facility];  Service: Endoscopy;;  colon  . CHOLECYSTECTOMY     . COLONOSCOPY     in [place]  . COLONOSCOPY WITH PROPOFOL N/A 05/20/2013   Dr.Rourk- inadequate prep, normal appearing rectum, grossly normal colon aside from pancolonic diverticula, normal terminal ileum bx= unremarkable colonic mucosa. Due for early interval 2016.   Marland Kitchen COLONOSCOPY WITH PROPOFOL N/A 04/26/2014   [id number] ileum/one colon polyp removed/moderate pan-colonic diverticulosis/large internal hemorrhoids  . COLONOSCOPY WITH PROPOFOL N/A 12/23/2014   Dr.Rourk- minimal internal hemorrhoids, pancolonic diverticulosis  . COLONOSCOPY WITH PROPOFOL N/A 08/23/2016   Procedure: COLONOSCOPY WITH PROPOFOL;  Surgeon: Danie Binder, MD;  [facility];  Service: Endoscopy;  Laterality: N/A;  . COLONOSCOPY WITH PROPOFOL N/A 12/24/2018   Pancolonic diverticulosis, normal TI, one 5 mm polyp in rectum (tubular adenoma). Somewhat friable hemorrhagic mucosa in area of IC valve/cecum, possibly related to scope trauma s/p biopsy.   . DEBRIDEMENT OF ABDOMINAL WALL ABSCESS N/A 02/08/2013   Procedure: DEBRIDEMENT OF ABDOMINAL WALL ABSCESS;  Surgeon: Jamesetta So, MD;  Location: [facility];  Service: General;  Laterality: N/A;  . ESOPHAGOGASTRODUODENOSCOPY (EGD) WITH PROPOFOL N/A 05/20/2013   Dr.Rourk- s/p prior gastric surgery with normal esophagus, residual gastric mucosa and patent efferent limb  . ESOPHAGOGASTRODUODENOSCOPY (EGD) WITH PROPOFOL N/A 02/03/2014   Dr. Gala Romney:  s/p hemigastrectomy with retained gastric contents. Residual gastric mucosa and efferent limb appeared normal otherwise. Query gastroparesis.   Marland Kitchen ESOPHAGOGASTRODUODENOSCOPY (EGD) WITH PROPOFOL N/A 04/26/2014   [id number] ring/small HH/mild non-erosive gasrtitis/normal anastomosis  . ESOPHAGOGASTRODUODENOSCOPY (EGD) WITH PROPOFOL N/A 12/23/2014   Dr.Rourk- s/p prior hemigastrctomy, active oozing from anastomotic suture site, hemostasis achieved  . ESOPHAGOGASTRODUODENOSCOPY (EGD) WITH PROPOFOL N/A 05/23/2016   Dr. 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 ESOPHAGOGASTRODUODENOSCOPY (EGD) WITH PROPOFOL N/A 05/11/2017   Procedure: ESOPHAGOGASTRODUODENOSCOPY (EGD) WITH PROPOFOL;  Surgeon: Daneil Dolin, MD;  [facility];  Service: Endoscopy;  Laterality: N/A;  . HEMORRHOID SURGERY N/A 06/18/2017   Procedure: THREE COLUMN EXTENSIVE HEMORRHOIDECTOMY;  Surgeon: Virl Cagey, MD;  Location: [facility];  Service: General;  Laterality: N/A;  . INCISION AND DRAINAGE ABSCESS N/A 01/06/2017   Procedure: INCISION AND DRAINAGE ABDOMINAL WALL ABSCESS;  Surgeon: Aviva Signs, MD;  Location: [facility];  Service: General;  Laterality: N/A;  . POLYPECTOMY  12/24/2018   Procedure: POLYPECTOMY;  Surgeon: Daneil Dolin, MD;  [facility];  Service: Endoscopy;;  colon  . tendon repar Right    wrist  . WOUND EXPLORATION Right 06/24/2013   Procedure: exploration of traumatic wound right wrist;  Surgeon: Tennis Must, MD;  Location: [facility];  Service: Orthopedics;  Laterality: Right;   Family History:  Family History  Problem Relation Age of Onset  . Brain cancer Son   . Schizophrenia Son   . Cancer Son        brain  .  Lung cancer Father   . Cancer Father        mets  . Drug abuse Mother   . Breast cancer Maternal Aunt   . Bipolar disorder Maternal Aunt   . Drug abuse Maternal Aunt   . Colon cancer Maternal Grandmother        late 51s, early 16s  . Drug abuse Sister   . Drug abuse Brother   . Bipolar disorder Paternal Grandfather   . Bipolar disorder Cousin   . Liver disease Neg Hx    Family Psychiatric  History: Paternal grandmother-bipolar disorder   Social History:  Social History   Substance and Sexual Activity  Alcohol Use Not Currently     Social History   Substance and Sexual Activity  Drug Use Not Currently  . Types: Cocaine    Social History   Socioeconomic History  . Marital status:  Divorced    Spouse name: Not on file  . Number of children: Not on file  . Years of education: Not on file  . Highest education level: Not on file  Occupational History  . Not on file  Tobacco Use  . Smoking status: Current Every Day Smoker    Packs/day: 0.50    Years: 35.00    Pack years: 17.50    Types: Cigarettes  . Smokeless tobacco: Never Used  Substance and Sexual Activity  . Alcohol use: Not Currently  . Drug use: Not Currently    Types: Cocaine  . Sexual activity: Not on file  Other Topics Concern  . Not on file  Social History Narrative  . Not on file   Social Determinants of Health   Financial Resource Strain: Low Risk   . Difficulty of Paying Living Expenses: Not very hard  Food Insecurity: No Food Insecurity  . Worried About Charity fundraiser in the Last Year: Never true  . Ran Out of Food in the Last Year: Never true  Transportation Needs: No Transportation Needs  . Lack of Transportation (Medical): No  . Lack of Transportation (Non-Medical): No  Physical Activity: Inactive  . Days of Exercise per Week: 0 days  . Minutes of Exercise per Session: 0 min  Stress: Stress Concern Present  . Feeling of Stress : Rather much  Social Connections: Somewhat Isolated  . Frequency of Communication with Friends and Family: More than three times a week  . Frequency of Social Gatherings with Friends and Family: More than three times a week  . Attends Religious Services: 1 to 4 times per year  . Active Member of Clubs or Organizations: No  . Attends Archivist Meetings: Never  . Marital Status: Divorced   Additional Social History:    Allergies:   Allergies  Allergen Reactions  . Codeine Hives  . Morphine And Related Hives  . Reglan [Metoclopramide] Other (See Comments)    EPS symptoms     Labs:  Results for orders placed or performed during the hospital encounter of 05/16/19 (from the past 48 hour(s))  CBG monitoring, ED     Status: Abnormal    Collection Time: 05/16/19  7:10 PM  Result Value Ref Range   Glucose-Capillary 110 (H) 70 - 99 mg/dL    Comment: Glucose reference range applies only to samples taken after fasting for at least 8 hours.  Protime-INR     Status: Abnormal   Collection Time: 05/16/19  7:14 PM  Result Value Ref Range   Prothrombin Time 17.0 (H) 11.4 -  15.2 seconds   INR 1.4 (H) 0.8 - 1.2    Comment: (NOTE) INR goal varies based on device and disease states. Performed at [facility], [place] [address] [place], [place] [address]   APTT     Status: None   Collection Time: 05/16/19  7:14 PM  Result Value Ref Range   aPTT 34 24 - 36 seconds    Comment: Performed at [facility] [address], [place], [place] [address]  CBC     Status: Abnormal   Collection Time: 05/16/19  7:14 PM  Result Value Ref Range   WBC 3.6 (L) 4.0 - 10.5 K/uL   RBC 4.54 3.87 - 5.11 MIL/uL   Hemoglobin 12.9 12.0 - 15.0 g/dL   HCT 36.5 36.0 - 46.0 %   MCV 80.4 80.0 - 100.0 fL   MCH 28.4 26.0 - 34.0 pg   MCHC 35.3 30.0 - 36.0 g/dL   RDW 14.5 11.5 - 15.5 %   Platelets 344 150 - 400 K/uL   nRBC 0.0 0.0 - 0.2 %    Comment: Performed at [facility], [place] [address], [place], [place] [address]  Differential     Status: Abnormal   Collection Time: 05/16/19  7:14 PM  Result Value Ref Range   Neutrophils Relative % 44 %   Neutro Abs 1.6 (L) 1.7 - 7.7 K/uL   Lymphocytes Relative 46 %   Lymphs Abs 1.6 0.7 - 4.0 K/uL   Monocytes Relative 8 %   Monocytes Absolute 0.3 0.1 - 1.0 K/uL   Eosinophils Relative 1 %   Eosinophils Absolute 0.0 0.0 - 0.5 K/uL   Basophils Relative 1 %   Basophils Absolute 0.0 0.0 - 0.1 K/uL   Immature Granulocytes 0 %   Abs Immature Granulocytes 0.01 0.00 - 0.07 K/uL    Comment: Performed at [facility] [address], [place], [place] [address]  Comprehensive metabolic panel     Status: Abnormal   Collection Time: 05/16/19  7:14 PM  Result Value Ref Range   Sodium 133 (L) 135 -  145 mmol/L   Potassium 4.3 3.5 - 5.1 mmol/L   Chloride 99 98 - 111 mmol/L   CO2 18 (L) 22 - 32 mmol/L   Glucose, Bld 113 (H) 70 - 99 mg/dL    Comment: Glucose reference range applies only to samples taken after fasting for at least 8 hours.   BUN 6 6 - 20 mg/dL   Creatinine, Ser 0.70 0.44 - 1.00 mg/dL   Calcium 8.7 (L) 8.9 - 10.3 mg/dL   Total Protein 7.4 6.5 - 8.1 g/dL   Albumin 3.8 3.5 - 5.0 g/dL   AST 44 (H) 15 - 41 U/L   ALT 37 0 - 44 U/L   Alkaline Phosphatase 65 38 - 126 U/L   Total Bilirubin 0.8 0.3 - 1.2 mg/dL   GFR calc non Af Amer >60 >60 mL/min   GFR calc Af Amer >60 >60 mL/min   Anion gap 16 (H) 5 - 15    Comment: Performed at [facility] [address], [place], [place] [address]  I-Stat beta hCG blood, ED     Status: None   Collection Time: 05/16/19  7:18 PM  Result Value Ref Range   I-stat hCG, quantitative <5.0 <5 mIU/mL   Comment 3            Comment:   GEST. AGE  CONC.  (mIU/mL)   <=1 WEEK        5 - 50     2 WEEKS       50 - 500     3 WEEKS       100 - 10,000     4 WEEKS     1,000 - 30,000        FEMALE AND NON-PREGNANT FEMALE:     LESS THAN 5 mIU/mL   I-stat chem 8, ED     Status: Abnormal   Collection Time: 05/16/19  7:21 PM  Result Value Ref Range   Sodium 134 (L) 135 - 145 mmol/L   Potassium 4.3 3.5 - 5.1 mmol/L   Chloride 101 98 - 111 mmol/L   BUN 7 6 - 20 mg/dL   Creatinine, Ser 0.90 0.44 - 1.00 mg/dL   Glucose, Bld 105 (H) 70 - 99 mg/dL    Comment: Glucose reference range applies only to samples taken after fasting for at least 8 hours.   Calcium, Ion 1.00 (L) 1.15 - 1.40 mmol/L   TCO2 20 (L) 22 - 32 mmol/L   Hemoglobin 13.6 12.0 - 15.0 g/dL   HCT 40.0 36.0 - 46.0 %  SARS CORONAVIRUS 2 (TAT 6-24 HRS) Nasopharyngeal Nasopharyngeal Swab     Status: Abnormal   Collection Time: 05/16/19 10:43 PM   Specimen: Nasopharyngeal Swab  Result Value Ref Range   SARS Coronavirus 2 POSITIVE (A) NEGATIVE    Comment: RESULT CALLED TO, READ BACK  BY AND VERIFIED WITH: P. OBASOGIE ASIDI,RN 0258 05/17/2019 T. TYSOR (NOTE) SARS-CoV-2 target nucleic acids are DETECTED. The SARS-CoV-2 RNA is generally detectable in upper and lower respiratory specimens during the acute phase of infection. Positive results are indicative of the presence of SARS-CoV-2 RNA. Clinical correlation with patient history and other diagnostic information is  necessary to determine patient infection status. Positive results do not rule out bacterial infection or co-infection with other viruses.  The expected result is Negative. Fact Sheet for Patients: SugarRoll.be Fact Sheet for Healthcare Providers: https://www.woods-mathews.com/ This test is not yet approved or cleared by the [place] FDA and  has been authorized for detection and/or diagnosis of SARS-CoV-2 by FDA under an Emergency Use Authorization (EUA). This EUA will remain  in effect (meaning this test can be use d) for the duration of the COVID-19 declaration under Section 564(b)(1) of the Act, 21 U.S.C. section 360bbb-3(b)(1), unless the authorization is terminated or revoked sooner. Performed at [facility], [place] [address] [place], [place] [address]   Ethanol     Status: Abnormal   Collection Time: 05/16/19 11:53 PM  Result Value Ref Range   Alcohol, Ethyl (B) 198 (H) <10 mg/dL    Comment: (NOTE) Lowest detectable limit for serum alcohol is 10 mg/dL. For medical purposes only. Performed at [facility], [place] [address] [place], [place] [id number]   Salicylate level     Status: Abnormal   Collection Time: 05/16/19 11:53 PM  Result Value Ref Range   Salicylate Lvl [id number] (L) 7.0 - 30.0 mg/dL    Comment: Performed at [facility] [address], [place], [place] [address]  Lactic acid, plasma     Status: Abnormal   Collection Time: 05/16/19 11:53 PM  Result Value Ref Range   Lactic Acid, Venous 3.9 (HH) 0.5 - 1.9 mmol/L     Comment: CRITICAL RESULT CALLED TO, READ BACK BY AND VERIFIED WITH: MONTEE B,RN 05/17/19 0038  [facility] Performed at [facility], [place] [address] [place], [place] [address]   Urine rapid drug screen (hosp performed)     Status: None   Collection Time: 05/17/19  6:39 AM  Result Value Ref Range   Opiates NONE DETECTED NONE DETECTED   Cocaine NONE DETECTED NONE DETECTED   Benzodiazepines NONE DETECTED NONE DETECTED   Amphetamines NONE DETECTED NONE DETECTED   Tetrahydrocannabinol NONE DETECTED NONE DETECTED   Barbiturates NONE DETECTED NONE DETECTED    Comment: (NOTE) DRUG SCREEN FOR MEDICAL PURPOSES ONLY.  IF CONFIRMATION IS NEEDED FOR ANY PURPOSE, NOTIFY LAB WITHIN 5 DAYS. LOWEST DETECTABLE LIMITS FOR URINE DRUG SCREEN Drug Class                     Cutoff (ng/mL) Amphetamine and metabolites    1000 Barbiturate and metabolites    200 Benzodiazepine                 [id number] Tricyclics and metabolites     300 Opiates and metabolites        300 Cocaine and metabolites        300 THC                            50 Performed at [facility], [place] [address] [place], [place] [id number]   Basic metabolic panel     Status: Abnormal   Collection Time: 05/17/19  7:13 AM  Result Value Ref Range   Sodium 141 135 - 145 mmol/L   Potassium 3.9 3.5 - 5.1 mmol/L   Chloride 113 (H) 98 - 111 mmol/L   CO2 17 (L) 22 - 32 mmol/L   Glucose, Bld 77 70 - 99 mg/dL    Comment: Glucose reference range applies only to samples taken after fasting for at least 8 hours.   BUN 9 6 - 20 mg/dL   Creatinine, Ser 0.68 0.44 - 1.00 mg/dL   Calcium 7.9 (L) 8.9 - 10.3 mg/dL   GFR calc non Af Amer >60 >60 mL/min   GFR calc Af Amer >60 >60 mL/min   Anion gap 11 5 - 15    Comment: Performed at [facility] [address] [place], [place] [address]  Lactic acid, plasma     Status: Abnormal   Collection Time: 05/17/19  7:13 AM  Result Value Ref Range   Lactic Acid, Venous 2.7 (HH) 0.5 - 1.9 mmol/L     Comment: CRITICAL VALUE NOTED.  VALUE IS CONSISTENT WITH PREVIOUSLY REPORTED AND CALLED VALUE. Performed at [facility], [place] [address] [place], [place] [address]     Current Facility-Administered Medications  Medication Dose Route Frequency Provider Last Rate Last Admin  . 0.9 %  sodium chloride infusion   Intravenous Continuous Elgergawy, Silver Huguenin, MD      . acetaminophen (TYLENOL) tablet 650 mg  650 mg Oral Q6H PRN Shela Leff, MD       Or  . acetaminophen (TYLENOL) suppository 650 mg  650 mg Rectal Q6H PRN Shela Leff, MD   650 mg at 05/16/19 2340  . aspirin EC tablet 81 mg  81 mg Oral Daily Shela Leff, MD   81 mg at 05/17/19 0848  . enoxaparin (LOVENOX) injection 40 mg  40 mg Subcutaneous Daily Shela Leff, MD   40 mg at 05/17/19 0848  . HYDROcodone-acetaminophen (NORCO/VICODIN) 5-325 MG per tablet 1-2 tablet  1-2 tablet Oral Q6H PRN Vertis Kelch, NP   1 tablet at 05/17/19 0324  . nicotine (NICODERM CQ - dosed in mg/24 hours) patch 14 mg  14 mg Transdermal Daily Shela Leff, MD   14 mg at 05/17/19 0849  . ondansetron (ZOFRAN) injection 4 mg  4 mg Intravenous Q6H PRN Bodenheimer, Charles A, NP   4 mg at 05/17/19 [id number]  . vancomycin (VANCOREADY) IVPB 750 mg/150 mL  750 mg Intravenous Q24H Erenest Blank, RPH 150 mL/hr at 05/17/19 0329 750 mg at 05/17/19 [id number]    Musculoskeletal: Strength & Muscle Tone: unable to assess  Gait & Station: Unable to assess Patient leans: N/A  Psychiatric Specialty Exam: Physical Exam  Nursing note and vitals reviewed. Constitutional: She is oriented to person, place, and time. She appears well-developed.  HENT:  Head: Normocephalic.  Cardiovascular: Normal rate.  Respiratory: Effort normal.  Neurological: She is alert and oriented to person, place, and time.    Review of Systems  Constitutional:       Patient complains of feeling weak on right side   HENT: Negative.   Eyes: Negative.    Respiratory: Negative.   Cardiovascular: Negative.   Gastrointestinal: Negative.   Musculoskeletal: Negative.   Skin: Negative.   Neurological: Negative.     Blood pressure 125/88, pulse 82, temperature 98.5 F (36.9 C), temperature source Oral, resp. rate 16, height 5\' 3"  (1.6 m), weight 60.1 kg, SpO2 94 %.Body mass index is 23.47 kg/m.  General Appearance: Fairly Groomed  Eye Contact:  Good  Speech:  Clear and Coherent and Normal Rate  Volume:  Normal  Mood:  Euthymic  Affect:  Appropriate and Congruent  Thought Process:  Coherent, Goal Directed and Descriptions of Associations: Intact  Orientation:  Full (Time, Place, and Person)  Thought Content:  WDL and Logical  Suicidal Thoughts:  No  Homicidal Thoughts:  No  Memory:  Immediate;   Good Recent;   Good Remote;   Good  Judgement:  Good  Insight:  Good  Psychomotor Activity:  Normal  Concentration:  Concentration: Good and Attention Span: Good  Recall:  Good  Fund of Knowledge:  good  Language:  Good  Akathisia:  No  Handed:  Right{  AIMS (if indicated):     Assets:  Communication Skills Desire for Improvement Financial Resources/Insurance Housing Intimacy Leisure Time Physical Health Resilience Social Support  ADL's:  Intact  Cognition:  WNL  Sleep:        Treatment Plan Summary: Case discussed with Dr Dwyane Dee. Medication management recommend consider trazodone 50 mg nightly as needed for reported insomnia.  Disposition: No evidence of imminent risk to self or others at present.   Patient does not meet criteria for psychiatric inpatient admission.  Follow-up with outpatient psychiatry.  Emmaline Kluver, FNP 05/17/2019 9:52 AM

## 2019-05-17 NOTE — ED Notes (Signed)
Critical value called from Santiago Glad in lab. Lactic 3.9.

## 2019-05-17 NOTE — Progress Notes (Signed)
PROGRESS NOTE                                                                                                                                                                                                             Patient Demographics:    Angel Price, is a 54 y.o. female, DOB - 02/24/1966, [id number]  Admit date - 05/16/2019   Admitting Physician Shela Leff, MD  Outpatient Primary MD for the patient is Rosita Fire, MD  LOS - 0   Chief Complaint  Patient presents with  . Code Stroke       Brief Narrative    54 y.o. 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  Per triage note, when patient's boyfriend arrived home, she was found to be nonverbal and unable to move her right upper and lower extremity with facial droop.  Patient did not speak during transport but did respond at the bridge of the ED. when I entered the room, patient was nonverbal.  Soon after when nursing staff entered the room, she started speaking.  States her boyfriend called EMS because she could not move the right side of her body.  She is not sure what time this started.  Reports having prior strokes.  No additional history could be obtained from her.  ED Course: Vital signs stable.  WBC count 3.6. Bicarb 18, anion gap 16.  Blood glucose 113.  CT head negative for acute intracranial abnormality.  CT angiogram showing minimal atherosclerosis without evidence of LVO or significant stenosis in the head and neck.  Patient was again seen by neurology today and her presentation was felt to be  psychogenic in nature.  Neurology recommended starting aspirin 81 mg daily given presence of stroke risk factors and recommended psychiatry consultation if her symptoms do not resolve.   Subjective:    Angel Price today    Assessment  & Plan :    Principal Problem:   Right sided weakness Active Problems:   Tobacco abuse   Essential hypertension, benign   Chronic wound infection of abdomen  Metabolic acidosis   Acute onset right-sided hemiplegia, global aphasia: - Work-up not suggestive of acute stroke.  Inconsistencies noted on exam as mentioned above.  - CT head negative for acute intracranial abnormality.   - CT angiogram showing minimal atherosclerosis without evidence of LVO or significant stenosis in the head and neck.  - Patient was again seen by neurology on admission and her presentation was felt to be psychogenic in nature. - Started on aspirin. - will consult PT/OT  Chronically draining abdominal wall wounds  - will DC abx as it doesn't look infected  Anion gap metabolic acidosis -Remains with significant lactic acidosis despite aggressive fluid hydration with multiple fluid boluses, setting of alcohol abuse, and starvation acidosis ,will go ahead and change her fluid to bicarb drip, and hyperchloremia.  Current tobacco use:  -Smoking 1/2 pack of cigarettes daily.  Nicotine patch ordered.  Needs continued counseling on smoking cessation.  Hypertension:  -Blood pressure is elevated, will start on clonidine to prevent alcohol withdrawal, will give low-dose Lasix given hyperchloremia with chloride of 113.  Alcohol abuse/intoxication -We will elevated on admission, she endorses heavy alcohol abuse to time 25 ounce beers daily to ambulate weekly, will start on CIWA protocol    COVID-19 Labs  No results for input(s): DDIMER, FERRITIN, LDH, CRP in the last 72 hours.  Lab Results  Component Value Date   SARSCOV2NAA POSITIVE (A) 05/16/2019   SARSCOV2NAA  NEGATIVE 05/09/2019   [place] NEGATIVE 02/23/2019   [place] NEGATIVE 12/22/2018     Code Status : Full  Family Communication  : D/W Patient  Disposition Plan  : Home  Barriers For Discharge : remains with significat lactic acidosis, on IV fluids  Consults  :  None  Procedures  : None  DVT Prophylaxis  : Logan lovenox  Lab Results  Component Value Date   PLT 344 05/16/2019    Antibiotics  :    Anti-infectives (From admission, onward)   Start     Dose/Rate Route Frequency Ordered Stop   05/17/19 0015  vancomycin (VANCOREADY) IVPB 750 mg/150 mL     750 mg 150 mL/hr over 60 Minutes Intravenous Every 24 hours 05/17/19 0007          Objective:   Vitals:   05/17/19 0353 05/17/19 0438 05/17/19 0800 05/17/19 1504  BP: 123/83 (!) 137/91 125/88 (!) 162/114  Pulse: 76  82 93  Resp: 19 18 16 19   Temp: 98.9 F (37.2 C) 98.5 F (36.9 C)  98.1 F (36.7 C)  TempSrc: Oral Oral  Oral  SpO2: 100% 96% 94% 97%  Weight:      Height:        Wt Readings from Last 3 Encounters:  05/17/19 60.1 kg  05/12/19 59 kg  05/08/19 59 kg     Intake/Output Summary (Last 24 hours) at 05/17/2019 1524 Last data filed at 05/17/2019 1503 Gross per 24 hour  Intake 3013.93 ml  Output 2250 ml  Net 763.93 ml     Physical Exam  Awake Alert, Oriented X 3, No new F.N deficits, Normal affect She has right-sided weakness, but this is inconsistent, at some points she has intermittently in between 2-5 out of 5, her speech is fluent and appropriate Symmetrical Chest wall movement, Good air movement bilaterally, CTAB RRR,No Gallops,Rubs or new Murmurs, No Parasternal Heave +ve B.Sounds, Abd Soft, patient had chronic abdominal wound, but no purulent discharge or foul-smelling odor, does not appear to be infected, no rebound - guarding or rigidity,  has suprapubic foley No Cyanosis, Clubbing or edema, No new Rash or bruise    Data Review:    CBC Recent Labs  Lab 05/13/19 0030 05/16/19 1914  05/16/19 1921  WBC 5.6 3.6*  --   HGB 12.3 12.9 13.6  HCT 34.5* 36.5 40.0  PLT 329 344  --   MCV 81.9 80.4  --   MCH 29.2 28.4  --   MCHC 35.7 35.3  --   RDW 14.5 14.5  --   LYMPHSABS 3.0 1.6  --   MONOABS 0.4 0.3  --   EOSABS 0.2 0.0  --   BASOSABS 0.0 0.0  --     Chemistries  Recent Labs  Lab 05/13/19 0030 05/16/19 1914 05/16/19 1921 05/17/19 0713  NA 135 133* 134* 141  K 4.4 4.3 4.3 3.9  CL 101 99 101 113*  CO2 24 18*  --  17*  GLUCOSE 101* 113* 105* 77  BUN 10 6 7 9   CREATININE 0.64 0.70 0.90 0.68  CALCIUM 8.9 8.7*  --  7.9*  AST 26 44*  --   --   ALT 22 37  --   --   ALKPHOS 74 65  --   --   BILITOT 0.5 0.8  --   --    ------------------------------------------------------------------------------------------------------------------ No results for input(s): CHOL, HDL, LDLCALC, TRIG, CHOLHDL, LDLDIRECT in the last 72 hours.  Lab Results  Component Value Date   HGBA1C 5.1 11/23/2016   ------------------------------------------------------------------------------------------------------------------ No results for input(s): TSH, T4TOTAL, T3FREE, THYROIDAB in the last 72 hours.  Invalid input(s): FREET3 ------------------------------------------------------------------------------------------------------------------ No results for input(s): VITAMINB12, FOLATE, FERRITIN, TIBC, IRON, RETICCTPCT in the last 72 hours.  Coagulation profile Recent Labs  Lab 05/16/19 1914  INR 1.4*    No results for input(s): DDIMER in the last 72 hours.  Cardiac Enzymes No results for input(s): CKMB, TROPONINI, MYOGLOBIN in the last 168 hours.  Invalid input(s): CK ------------------------------------------------------------------------------------------------------------------    Component Value Date/Time   BNP 13.0 03/14/2014 1054    Inpatient Medications  Scheduled Meds: . aspirin EC  81 mg Oral Daily  . enoxaparin (LOVENOX) injection  40 mg Subcutaneous Daily  .  folic acid  1 mg Oral Daily  . furosemide  20 mg Intravenous Once  . multivitamin with minerals  1 tablet Oral Daily  . mupirocin ointment   Topical BID  . nicotine  14 mg Transdermal Daily  . thiamine  100 mg Oral Daily   Or  . thiamine  100 mg Intravenous Daily   Continuous Infusions: .  sodium bicarbonate (isotonic) infusion in sterile water    . vancomycin 750 mg (05/17/19 0329)   PRN Meds:.acetaminophen **OR** acetaminophen, HYDROcodone-acetaminophen, LORazepam **OR** LORazepam, ondansetron (ZOFRAN) IV  Micro Results Recent Results (from the past 240 hour(s))  SARS CORONAVIRUS 2 (TAT 6-24 HRS) Nasopharyngeal Nasopharyngeal Swab     Status: None   Collection Time: 05/09/19  3:57 PM   Specimen: Nasopharyngeal Swab  Result Value Ref Range Status   SARS Coronavirus 2 NEGATIVE NEGATIVE Final    Comment: (NOTE) SARS-CoV-2 target nucleic acids are NOT DETECTED. The SARS-CoV-2 RNA is generally detectable in upper and lower respiratory specimens during the acute phase of infection. Negative results do not preclude SARS-CoV-2 infection, do not rule out co-infections with other pathogens, and should not be used as the sole basis for treatment or other patient management decisions. Negative results must be combined with clinical observations, patient history, and epidemiological information. The expected result is  Negative. Fact Sheet for Patients: SugarRoll.be Fact Sheet for Healthcare Providers: https://www.woods-mathews.com/ This test is not yet approved or cleared by the [place] FDA and  has been authorized for detection and/or diagnosis of SARS-CoV-2 by FDA under an Emergency Use Authorization (EUA). This EUA will remain  in effect (meaning this test can be used) for the duration of the COVID-19 declaration under Section 56 4(b)(1) of the Act, 21 U.S.C. section 360bbb-3(b)(1), unless the authorization is terminated or revoked  sooner. Performed at [facility], [place] [address], [place], [place] [address]   SARS CORONAVIRUS 2 (TAT 6-24 HRS) Nasopharyngeal Nasopharyngeal Swab     Status: Abnormal   Collection Time: 05/16/19 10:43 PM   Specimen: Nasopharyngeal Swab  Result Value Ref Range Status   SARS Coronavirus 2 POSITIVE (A) NEGATIVE Final    Comment: RESULT CALLED TO, READ BACK BY AND VERIFIED WITH: P. OBASOGIE ASIDI,RN 0258 05/17/2019 T. TYSOR (NOTE) SARS-CoV-2 target nucleic acids are DETECTED. The SARS-CoV-2 RNA is generally detectable in upper and lower respiratory specimens during the acute phase of infection. Positive results are indicative of the presence of SARS-CoV-2 RNA. Clinical correlation with patient history and other diagnostic information is  necessary to determine patient infection status. Positive results do not rule out bacterial infection or co-infection with other viruses.  The expected result is Negative. Fact Sheet for Patients: SugarRoll.be Fact Sheet for Healthcare Providers: https://www.woods-mathews.com/ This test is not yet approved or cleared by the [place] FDA and  has been authorized for detection and/or diagnosis of SARS-CoV-2 by FDA under an Emergency Use Authorization (EUA). This EUA will remain  in effect (meaning this test can be use d) for the duration of the COVID-19 declaration under Section 564(b)(1) of the Act, 21 U.S.C. section 360bbb-3(b)(1), unless the authorization is terminated or revoked sooner. Performed at [facility], [place] [address] [place], [place] [address]     Radiology Reports CT Code Stroke CTA Head W/WO contrast  Result Date: 05/16/2019 CLINICAL DATA:  Right-sided weakness and aphasia. EXAM: CT ANGIOGRAPHY HEAD AND NECK TECHNIQUE: Multidetector CT imaging of the head and neck was performed using the standard protocol during bolus administration of intravenous contrast. Multiplanar CT  image reconstructions and MIPs were obtained to evaluate the vascular anatomy. Carotid stenosis measurements (when applicable) are obtained utilizing NASCET criteria, using the distal internal carotid diameter as the denominator. CONTRAST:  181mL OMNIPAQUE IOHEXOL 350 MG/ML SOLN COMPARISON:  12/20/2016 FINDINGS: CTA NECK FINDINGS Aortic arch: Normal variant aortic arch branching pattern with common origin of the brachiocephalic and left common carotid arteries. Minimal atherosclerotic calcification in the aortic arch. Widely patent arch vessel origins. Right carotid system: Patent and smooth without evidence of stenosis or dissection. Left carotid system: Patent and smooth without evidence of stenosis or dissection. Vertebral arteries: Patent, smooth, and codominant without evidence of stenosis or dissection. Skeleton: Moderate disc degeneration at C5-6. Other neck: No evidence of cervical lymphadenopathy or mass. Upper chest: Clear lung apices. Review of the MIP images confirms the above findings CTA HEAD FINDINGS Anterior circulation: The internal carotid arteries are patent from skull base to carotid termini with minimal nonstenotic plaque bilaterally. ACAs and MCAs are patent without evidence of proximal branch occlusion or significant proximal stenosis. No aneurysm is identified. Posterior circulation: The intracranial vertebral arteries are widely patent to the basilar. Patent PICAs and SCAs are seen bilaterally. The basilar artery is widely patent. There are right larger than left posterior communicating arteries. Both PCAs are  patent without evidence of significant proximal stenosis. No aneurysm is identified. Venous sinuses: As permitted by contrast timing, patent. Anatomic variants: None. Review of the MIP images confirms the above findings IMPRESSION: 1. Minimal atherosclerosis without large vessel occlusion or significant stenosis in the head and neck. 2.  Aortic Atherosclerosis (ICD10-I70.0). These  results were communicated to Dr. Cheral Marker at 7:50 pm on 05/16/2019 by text page via the [facility] messaging system. Electronically Signed   By: Logan Bores M.D.   On: 05/16/2019 19:55   CT Code Stroke CTA Neck W/WO contrast  Result Date: 05/16/2019 CLINICAL DATA:  Right-sided weakness and aphasia. EXAM: CT ANGIOGRAPHY HEAD AND NECK TECHNIQUE: Multidetector CT imaging of the head and neck was performed using the standard protocol during bolus administration of intravenous contrast. Multiplanar CT image reconstructions and MIPs were obtained to evaluate the vascular anatomy. Carotid stenosis measurements (when applicable) are obtained utilizing NASCET criteria, using the distal internal carotid diameter as the denominator. CONTRAST:  156mL OMNIPAQUE IOHEXOL 350 MG/ML SOLN COMPARISON:  12/20/2016 FINDINGS: CTA NECK FINDINGS Aortic arch: Normal variant aortic arch branching pattern with common origin of the brachiocephalic and left common carotid arteries. Minimal atherosclerotic calcification in the aortic arch. Widely patent arch vessel origins. Right carotid system: Patent and smooth without evidence of stenosis or dissection. Left carotid system: Patent and smooth without evidence of stenosis or dissection. Vertebral arteries: Patent, smooth, and codominant without evidence of stenosis or dissection. Skeleton: Moderate disc degeneration at C5-6. Other neck: No evidence of cervical lymphadenopathy or mass. Upper chest: Clear lung apices. Review of the MIP images confirms the above findings CTA HEAD FINDINGS Anterior circulation: The internal carotid arteries are patent from skull base to carotid termini with minimal nonstenotic plaque bilaterally. ACAs and MCAs are patent without evidence of proximal branch occlusion or significant proximal stenosis. No aneurysm is identified. Posterior circulation: The intracranial vertebral arteries are widely patent to the basilar. Patent PICAs and SCAs are seen bilaterally. The  basilar artery is widely patent. There are right larger than left posterior communicating arteries. Both PCAs are patent without evidence of significant proximal stenosis. No aneurysm is identified. Venous sinuses: As permitted by contrast timing, patent. Anatomic variants: None. Review of the MIP images confirms the above findings IMPRESSION: 1. Minimal atherosclerosis without large vessel occlusion or significant stenosis in the head and neck. 2.  Aortic Atherosclerosis (ICD10-I70.0). These results were communicated to Dr. Cheral Marker at 7:50 pm on 05/16/2019 by text page via the [facility] messaging system. Electronically Signed   By: Logan Bores M.D.   On: 05/16/2019 19:55   MR BRAIN WO CONTRAST  Result Date: 05/09/2019 CLINICAL DATA:  Headache and right upper extremity/lower extremity weakness for 24 hours EXAM: MRI HEAD WITHOUT CONTRAST TECHNIQUE: Multiplanar, multiecho pulse sequences of the brain and surrounding structures were obtained without intravenous contrast. COMPARISON:  12/20/2016 FINDINGS: Brain: No acute infarction, hemorrhage, hydrocephalus, extra-axial collection or mass lesion. Two tiny FLAIR hyperintensities in the cerebral white matter, allowable for age and nonspecific. At least 1 of these was seen on prior. Brain volume is normal. Vascular: Normal flow voids Skull and upper cervical spine: Normal marrow signal Sinuses/Orbits: Chronic nasal septum defect. IMPRESSION: No emergent finding or significant change from 2018. Electronically Signed   By: Monte Fantasia M.D.   On: 05/09/2019 12:25   DG ABD ACUTE 2+V W 1V CHEST  Result Date: 05/02/2019 CLINICAL DATA:  Abdomen pain with vomiting EXAM: DG ABDOMEN ACUTE W/ 1V CHEST COMPARISON:  07/02/2017, CT 02/23/2019,  chest x-ray 11/11/2018 FINDINGS: Single-view chest demonstrates possible subtle peripheral ground-glass opacity. Normal heart size. No pneumothorax. Supine and upright views of the abdomen demonstrate no free air beneath the diaphragm.  Postsurgical changes in the upper abdomen. Nonobstructed gas pattern. : 1. Nonobstructed gas pattern. 2. Possible subtle peripheral ground-glass opacities in the left mid to upper lung, question atypical or viral process. Electronically Signed   By: Donavan Foil M.D.   On: 05/02/2019 23:48   CT HEAD CODE STROKE WO CONTRAST  Result Date: 05/16/2019 CLINICAL DATA:  Code stroke.  Right-sided weakness and aphasia. EXAM: CT HEAD WITHOUT CONTRAST TECHNIQUE: Contiguous axial images were obtained from the base of the skull through the vertex without intravenous contrast. COMPARISON:  Head MRI 05/09/2019 FINDINGS: Brain: There is no evidence of acute infarct, intracranial hemorrhage, mass, midline shift, or extra-axial fluid collection. A partially empty sella is unchanged. The ventricles and sulci are normal. Vascular: Calcified atherosclerosis at the skull base. No hyperdense vessel. Skull: No fracture or suspicious osseous lesion. Sinuses/Orbits: Visualized paranasal sinuses and mastoid air cells are clear. Orbits are unremarkable. Other: None. ASPECTS [facility] Stroke Program Early CT Score) - Ganglionic level infarction (caudate, lentiform nuclei, internal capsule, insula, M1-M3 cortex): 7 - Supraganglionic infarction (M4-M6 cortex): 3 Total score (0-10 with 10 being normal): 10 IMPRESSION: 1. No evidence of acute intracranial abnormality. 2. ASPECTS is 10. These results were communicated to Dr. Cheral Marker at 7:26 pm on 05/16/2019 by text page via the [facility] messaging system. Electronically Signed   By: Logan Bores M.D.   On: 05/16/2019 19:26      Phillips Climes M.D on 05/17/2019 at 3:24 PM  Between 7am to 7pm - Pager - [phone]  After 7pm go to [email or web address] - password [email or web address]  [facility] -  Office  [phone]

## 2019-05-18 ENCOUNTER — Inpatient Hospital Stay (HOSPITAL_COMMUNITY): Payer: Medicare Other

## 2019-05-18 DIAGNOSIS — Z720 Tobacco use: Secondary | ICD-10-CM

## 2019-05-18 LAB — TROPONIN I (HIGH SENSITIVITY)
Troponin I (High Sensitivity): <2 ng/L (ref <18)
Troponin I (High Sensitivity): <2 ng/L (ref <18)
Troponin I (High Sensitivity): <2 ng/L (ref <18)

## 2019-05-18 LAB — BASIC METABOLIC PANEL
Anion gap: 10 (ref 5–15)
BUN: <5 mg/dL — ABNORMAL LOW (ref 6–20)
CO2: 27 mmol/L (ref 22–32)
Calcium: 8.7 mg/dL — ABNORMAL LOW (ref 8.9–10.3)
Chloride: 100 mmol/L (ref 98–111)
Creatinine, Ser: 0.56 mg/dL (ref 0.44–1.00)
GFR calc Af Amer: >60 mL/min (ref >60)
GFR calc non Af Amer: >60 mL/min (ref >60)
Glucose, Bld: 101 mg/dL — ABNORMAL HIGH (ref 70–99)
Potassium: 3.2 mmol/L — ABNORMAL LOW (ref 3.5–5.1)
Sodium: 137 mmol/L (ref 135–145)

## 2019-05-18 LAB — C-REACTIVE PROTEIN: CRP: 0.5 mg/dL (ref <1.0)

## 2019-05-18 LAB — CBC
HCT: 31.9 % — ABNORMAL LOW (ref 36.0–46.0)
Hemoglobin: 11.2 g/dL — ABNORMAL LOW (ref 12.0–15.0)
MCH: 28.5 pg (ref 26.0–34.0)
MCHC: 35.1 g/dL (ref 30.0–36.0)
MCV: 81.2 fL (ref 80.0–100.0)
Platelets: 287 10*3/uL (ref 150–400)
RBC: 3.93 MIL/uL (ref 3.87–5.11)
RDW: 14.4 % (ref 11.5–15.5)
WBC: 5.5 10*3/uL (ref 4.0–10.5)
nRBC: 0 % (ref 0.0–0.2)

## 2019-05-18 LAB — D-DIMER, QUANTITATIVE: D-Dimer, Quant: 2.78 ug/mL-FEU — ABNORMAL HIGH (ref 0.00–0.50)

## 2019-05-18 MED ORDER — NICOTINE 14 MG/24HR TD PT24: 14.0000 mg | MEDICATED_PATCH | Freq: Every day | TRANSDERMAL | 0 refills | Status: DC

## 2019-05-18 MED ORDER — POTASSIUM CHLORIDE CRYS ER 20 MEQ PO TBCR
40.0000 meq | EXTENDED_RELEASE_TABLET | Freq: Once | ORAL | Status: AC
  Administered 2019-05-18: 40 meq via ORAL
  Filled 2019-05-18: qty 2

## 2019-05-18 MED ORDER — IOHEXOL 350 MG/ML SOLN
100.0000 mL | Freq: Once | INTRAVENOUS | Status: AC | PRN
  Administered 2019-05-18: 100 mL via INTRAVENOUS

## 2019-05-18 MED ORDER — ASPIRIN 81 MG PO TBEC: 81.0000 mg | DELAYED_RELEASE_TABLET | Freq: Every day | ORAL | 0 refills | Status: DC

## 2019-05-18 MED ORDER — PROMETHAZINE HCL 25 MG PO TABS
25.0000 mg | ORAL_TABLET | Freq: Once | ORAL | Status: AC
  Administered 2019-05-18: 25 mg via ORAL
  Filled 2019-05-18: qty 1

## 2019-05-18 MED ORDER — MAGNESIUM SULFATE 2 GM/50ML IV SOLN
2.0000 g | Freq: Once | INTRAVENOUS | Status: AC
  Administered 2019-05-18: 2 g via INTRAVENOUS
  Filled 2019-05-18: qty 50

## 2019-05-18 NOTE — Progress Notes (Signed)
OT Cancellation Note  Patient Details Name: KATRESE PETILLO MRN: [id number] DOB: 24-Dec-1965   Cancelled Treatment:    Reason Eval/Treat Not Completed: Patient at procedure or test/ unavailable;Patient not medically ready;Other (comment)(Pt with EKG this AM, troponins not elevated. However, stat chest CT ordered as OT attempting to initiate eval. Will check back after results and when cleared for evaluation)  Layla Maw 05/18/2019, 1:52 PM

## 2019-05-18 NOTE — Progress Notes (Signed)
Pt. c/o of chest pain 8/10 scale, stabbing like pain, non radiating. Also, pain when breathing. Vital sign taken BP 143/152mmHg on monitor  . recheck manually BP 138/56mmHg. Notified and textpaged on call night provider NP M. Sharlet Salina, orders were given. Stat EKG and Trop I stat and every 2 hours. Will continue to monitor patient.

## 2019-05-18 NOTE — Progress Notes (Addendum)
Physical Therapy Evaluation  Patient presents with R sided weakness although grossly stable with ambulation using RW. Patient presents below her stated PLOF of modI with RW and stair negotiation.  Recommend continued use of her RW, home PT, and family home with patient for the first day or two upon discharge. Patient verbalized understanding to PT recommendations.   05/18/19 1439  PT Visit Information  Last PT Received On 05/18/19  Assistance Needed +1  PT/OT/SLP Co-Evaluation/Treatment Yes  Reason for Co-Treatment Necessary to address cognition/behavior during functional activity  PT goals addressed during session Mobility/safety with mobility;Balance;Proper use of DME  History of Present Illness 54 year old female admitted to the hospital on 05/16/19 with R sided weakness, facial numbness and code stroke activated. Patient with history of previous weakness with negative MRI, malingering. CT head negative for acute intracranial abnormality. CT angiogram: no LVO or stenosis. 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 EKG and troponins not concerning per secure message with MD today. CTA chest: negative for PE, lower lung volumes from prior CT with bilat atelectasis, mild nonspecific pulmonary ground-glass opacity (COVID PNA difficult to exclude)   Precautions  Precautions Fall  Restrictions  Weight Bearing Restrictions No  Home Living  Family/patient expects to be discharged to: Private residence  Living Arrangements Spouse/significant other  Available Help at Discharge Family  Type of Home  (Condo)  Home Access Stairs to enter  Entrance Stairs-Number of Steps 2  Entrance Stairs-Rails Left  Home Layout Two level;1/2 bath on main level  Alternate Level Stairs-Number of Steps 12  Alternate Level Stairs-Rails  (one railing)  Bathroom Counselling psychologist - 2 wheels  Additional Comments Boyfriend works and sometimes dtr works with him.   Prior Function  Level of Independence Independent with assistive device(s)  Comments modI with RW PTA  Communication  Communication No difficulties  Pain Assessment  Pain Assessment 0-10  Pain Score 5  Pain Location abdomen  Pain Intervention(s) Monitored during session;Limited activity within patient's tolerance  Cognition  Arousal/Alertness Awake/alert  Upper Extremity Assessment  Upper Extremity Assessment Defer to OT evaluation  Lower Extremity Assessment  Lower Extremity Assessment RLE deficits/detail  RLE Deficits / Details RLE ankle strength 4/5, R knee extension 3/5, R knee flexion 4/5, R hip flexion 3+/5 but at times strength testing fluctuating  RLE Sensation decreased light touch (decreased to absent at right foot)  Bed Mobility  Overal bed mobility Modified Independent  Bed Mobility Supine to Sit  Supine to sit Modified independent (Device/Increase time)  General bed mobility comments increased time  Transfers  Overall transfer level Needs assistance  Equipment used Rolling walker (2 wheeled)  Transfers Sit to/from Stand  Sit to Stand Supervision  General transfer comment Cues for hand placement  Ambulation/Gait  Ambulation/Gait assistance Supervision;Min guard  Gait Distance (Feet) 150 Feet (10)  Assistive device Rolling walker (2 wheeled)  Gait Pattern/deviations Step-through pattern  General Gait Details Gait quality does not correlate to MMT. No LOB  Stairs Yes  Stairs assistance Min guard  Stair Management  (L then R rail)  Number of Stairs 2 (patient declined negotiating further steps)  Balance  Overall balance assessment Needs assistance  Sitting-balance support No upper extremity supported  Sitting balance-Leahy Scale Good  Sitting balance - Comments Sitting EOB reaching down and outside BOS with BUEs with supervision.  Standing balance support Single extremity supported;Bilateral upper extremity supported  Standing balance-Leahy Scale Fair  PT  - End of Session  Equipment Utilized During Treatment Gait belt  Activity Tolerance Patient tolerated treatment well  Patient left with nursing/sitter in room;with call bell/phone within reach (sitting EOB per pateint request)  Nurse Communication  (nurse and MD cleared patient to participate in PT evaluation)  PT Assessment  PT Recommendation/Assessment Patient needs continued PT services  PT Visit Diagnosis Difficulty in walking, not elsewhere classified (R26.2);Muscle weakness (generalized) (M62.81)  PT Problem List Decreased strength;Decreased activity tolerance;Decreased balance;Decreased mobility;Decreased knowledge of use of DME;Pain  PT Plan  PT Frequency (ACUTE ONLY) Min 3X/week  PT Treatment/Interventions (ACUTE ONLY) DME instruction;Gait training;Stair training;Functional mobility training;Therapeutic activities;Therapeutic exercise;Balance training;Patient/family education  AM-PAC PT "6 Clicks" Mobility Outcome Measure (Version 2)  Help needed turning from your back to your side while in a flat bed without using bedrails? 4  Help needed moving from lying on your back to sitting on the side of a flat bed without using bedrails? 4  Help needed moving to and from a bed to a chair (including a wheelchair)? 4  Help needed standing up from a chair using your arms (e.g., wheelchair or bedside chair)? 4  Help needed to walk in hospital room? 3  Help needed climbing 3-5 steps with a railing?  3  6 Click Score 22  Consider Recommendation of Discharge To: Home with no services  PT Recommendation  Follow Up Recommendations Home health PT;Supervision for mobility/OOB (recommend family home for first day or two)  PT equipment Rolling walker with 5" wheels ((patient owns))  Individuals Consulted  Consulted and Agree with Results and Recommendations Patient  Acute Rehab PT Goals  Time For Goal Achievement 05/31/19  Potential to Achieve Goals Good  PT Time Calculation  PT Start Time  (ACUTE ONLY) 1439  PT Stop Time (ACUTE ONLY) 1459  PT Time Calculation (min) (ACUTE ONLY) 20 min  PT General Charges  $$ ACUTE PT VISIT 1 Visit  PT Evaluation  $PT Eval Low Complexity 1 Low   Birdie Hopes, DPT, PT Acute Rehab [phone] office

## 2019-05-18 NOTE — Plan of Care (Signed)
  Problem: Education: Goal: Knowledge of General Education information will improve Description: Including pain rating scale, medication(s)/side effects and non-pharmacologic comfort measures Outcome: Progressing   Problem: Clinical Measurements: Goal: Ability to maintain clinical measurements within normal limits will improve Outcome: Progressing   

## 2019-05-18 NOTE — Progress Notes (Signed)
Angel Price to be D/C'd Home per MD order.  Discussed with the patient and all questions fully answered.  VSS, Skin clean, dry and intact without evidence of skin break down, no evidence of skin tears noted. IV catheter discontinued intact. Site without signs and symptoms of complications. Dressing and pressure applied.  An After Visit Summary was printed and given to the patient. Patient received prescription.  D/c education completed with patient/family including follow up instructions, medication list, d/c activities limitations if indicated, with other d/c instructions as indicated by MD - patient able to verbalize understanding, all questions fully answered.   Patient instructed to return to ED, call 911, or call MD for any changes in condition.   Patient escorted via [place], and D/C home via private auto.   [name] [facility] 05/18/2019 6:25 PM

## 2019-05-18 NOTE — Progress Notes (Signed)
 Occupational Therapy Evaluation Patient Details Name: Angel Price MRN: [id number] DOB: [birth year] Today's Date: 05/18/2019    History of Present Illness Angel Price is a 54yo female seen in ED today with c/o R sided weakness, facial numbess.  history of a previous episode of weakness that was MRI negative, malingering, GERD, gastroparesis, lupus. MRI negative   Clinical Impression   PTA, pt lives with family at home and was Modified Independent with ADLs/mobility using RW. Currently, pt presents with minimal strength of R shoulder, biceps, and grip strength during MMT, but R triceps strength WFL. Pt used B UE equally during session. Pt Supervision for bed mobility to sit EOB, sit to stand with RW, and  Min guard for mobility during hallway distances. Pt min guard to don socks sitting EOB to ensure balance due to excessive leaning forward. Pt requests HHOT at DC and may benefit from initial home safety evaluation. Will continue to follow acutely.     Follow Up Recommendations  Home health OT;Supervision/Assistance - 24 hour(Initial 24 hour supervision to ensure safety)    Equipment Recommendations  None recommended by OT    Recommendations for Other Services       Precautions / Restrictions Precautions Precautions: Fall Restrictions Weight Bearing Restrictions: No      Mobility Bed Mobility Overal bed mobility: Needs Assistance Bed Mobility: Supine to Sit     Supine to sit: Supervision        Transfers Overall transfer level: Needs assistance Equipment used: Rolling walker (2 wheeled) Transfers: Sit to/from Stand Sit to Stand: Supervision         General transfer comment: Cues for hand placement    Balance Overall balance assessment: Needs assistance Sitting-balance support: No upper extremity supported Sitting balance-Leahy Scale: Good     Standing balance support: Single extremity supported;Bilateral upper extremity supported Standing balance-Leahy  Scale: Fair                             ADL either performed or assessed with clinical judgement   ADL Overall ADL's : Needs assistance/impaired Eating/Feeding: Independent;Sitting   Grooming: Set up;Standing   Upper Body Bathing: Set up;Sitting   Lower Body Bathing: Supervison/ safety;Sit to/from stand   Upper Body Dressing : Set up;Sitting   Lower Body Dressing: Min guard;Sitting/lateral leans;Sit to/from stand       Toileting- Water quality scientist and Hygiene: Supervision/safety;Sit to/from stand;Min guard               Vision         Perception     Praxis      Pertinent Vitals/Pain Pain Assessment: 0-10 Pain Score: 5  Pain Location: abdomen Pain Intervention(s): Monitored during session     Hand Dominance     Extremity/Trunk Assessment Upper Extremity Assessment Upper Extremity Assessment: RUE deficits/detail RUE Deficits / Details: Min strength demonstrated with MMT (aside from 4/5 triceps), but pt using B UE appropriately and equally during eval RUE Sensation: WNL RUE Coordination: WNL   Lower Extremity Assessment Lower Extremity Assessment: Defer to PT evaluation RLE Deficits / Details: RLE ankle strength 4/5, R knee extension 3/5, R knee flexion 4/5, R hip flexion 3+/5 but at times strength testing fluctuating RLE Sensation: decreased light touch(decreased to absent at right foot)       Communication Communication Communication: No difficulties   Cognition Arousal/Alertness: Awake/alert  General Comments       Exercises     Shoulder Instructions      Home Living Family/patient expects to be discharged to:: Private residence Living Arrangements: Spouse/significant other Available Help at Discharge: Family Type of Home: (Condo) Home Access: Stairs to enter Technical  of Steps: 2 Entrance Stairs-Rails: Left Home Layout: Two level;1/2 bath on main  level Alternate Level Stairs-Number of Steps: 12 Alternate Level Stairs-Rails: (one railing) Bathroom Shower/Tub: Tub/shower unit         Home Equipment: Environmental consultant - 2 wheels   Additional Comments: Boyfriend works and sometimes dtr works with him.      Prior Functioning/Environment Level of Independence: Independent with assistive device(s)        Comments: modI with RW PTA        OT Problem List: Decreased strength;Impaired balance (sitting and/or standing)      OT Treatment/Interventions: Self-care/ADL training;Therapeutic exercise;DME and/or AE instruction;Therapeutic activities;Patient/family education    OT Goals(Current goals can be found in the care plan section) Acute Rehab OT Goals Patient Stated Goal: participate in therapy in hospital and at home OT Goal Formulation: With patient Time For Goal Achievement: 06/01/19 Potential to Achieve Goals: Good  OT Frequency: Min 3X/week   Barriers to D/C:            Co-evaluation PT/OT/SLP Co-Evaluation/Treatment: Yes Reason for Co-Treatment: Necessary to address cognition/behavior during functional activity PT goals addressed during session: Mobility/safety with mobility;Balance;Proper use of DME OT goals addressed during session: ADL's and self-care      AM-PAC OT "6 Clicks" Daily Activity     Outcome Measure Help from another person eating meals?: None Help from another person taking care of personal grooming?: A Little Help from another person toileting, which includes using toliet, bedpan, or urinal?: A Little Help from another person bathing (including washing, rinsing, drying)?: A Little Help from another person to put on and taking off regular upper body clothing?: A Little Help from another person to put on and taking off regular lower body clothing?: A Little 6 Click Score: 19   End of Session Equipment Utilized During Treatment: Gait belt;Rolling walker Nurse Communication: Mobility status;Other  (comment)(RN entering after eval )  Activity Tolerance: Patient tolerated treatment well Patient left: in bed;with call bell/phone within reach(Left sitting EOB per pt request)  OT Visit Diagnosis: Unsteadiness on feet (R26.81);Muscle weakness (generalized) (M62.81)                Time: 1439-1500 OT Time Calculation (min): 21 min Charges:  OT General Charges $OT Visit: 1 Visit OT Evaluation $OT Eval Moderate Complexity: 1 Mod  Layla Maw, OTR/L  Layla Maw 05/18/2019, 3:42 PM

## 2019-05-18 NOTE — Discharge Summary (Addendum)
Angel Price, is a 54 y.o. female  DOB 1966-02-24  MRN [id number].  Admission date:  05/16/2019  Admitting Physician  Shela Leff, MD  Discharge Date:  05/18/2019   Primary MD  Rosita Fire, MD  Recommendations for primary care physician for things to follow:  - please check CBC, CMP during next visit. -Please continue counseling about tobacco and alcohol cessation   Admission Diagnosis  Right sided weakness [R53.1]   Discharge Diagnosis  Right sided weakness [R53.1]   Principal Problem:   Right sided weakness Active Problems:   Tobacco abuse   Essential hypertension, benign   Chronic wound infection of abdomen   Metabolic acidosis      Past Medical History:  Diagnosis Date  . Abscess    soft tissue  . Adrenal mass ([place])   . Alcohol abuse   . Anxiety   . Blood transfusion without reported diagnosis   . Chronic abdominal pain   . Chronic wound infection of abdomen   . Colon polyp    colonoscopy 04/2014  . Depression   . Diverticulosis    colonoscopy 04/2014 moderat pan colonic  . Drug-seeking behavior   . Gastritis    EGD 05/2014  . Gastroparesis Nov 2015  . GERD (gastroesophageal reflux disease)   . Hemorrhoid    internal large  . Hiatal hernia   . History of Billroth II operation   . Hypertension   . Lung nodule    CT 02/2014 needs repeat 1 month  . Lung nodule < 6cm on CT 04/25/2014  . Lupus ([place])   . Malingering   . Nausea and vomiting    chronic, recurrent  . Pancreatitis   . Schatzki's ring    patent per EGD 04/2014  . Sickle cell trait ([place])   . Suicide attempt ([place])   . 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 ABDOMINAL HYSTERECTOMY  2013   [place]  . ABDOMINAL SURGERY    . ADRENALECTOMY Right   . AGILE CAPSULE N/A 01/05/2015   Procedure: AGILE CAPSULE;  Surgeon: Daneil Dolin, MD;   [facility];  Service: Endoscopy;  Laterality: N/A;  0700  . Billroth II procedure      [place], first 2000, 2005/2006.  Marland Kitchen BIOPSY  05/20/2013   Procedure: BIOPSIES OF ASCENDING AND SIGMOID COLON;  Surgeon: Daneil Dolin, MD;  Location: [facility];  Service: Endoscopy;;  . BIOPSY  04/26/2014   Procedure: BIOPSIES;  Surgeon: Danie Binder, MD;  Location: [facility];  Service: Endoscopy;;  . BIOPSY  12/24/2018   Procedure: BIOPSY;  Surgeon: Daneil Dolin, MD;  [facility];  Service: Endoscopy;;  colon  . CHOLECYSTECTOMY    . COLONOSCOPY     in [place]  . COLONOSCOPY WITH PROPOFOL N/A 05/20/2013   Dr.Rourk- inadequate prep, normal appearing rectum, grossly normal colon aside from pancolonic diverticula, normal terminal ileum bx= unremarkable colonic mucosa. Due for early  interval 2016.   Marland Kitchen COLONOSCOPY WITH PROPOFOL N/A 04/26/2014   [id number] ileum/one colon polyp removed/moderate pan-colonic diverticulosis/large internal hemorrhoids  . COLONOSCOPY WITH PROPOFOL N/A 12/23/2014   Dr.Rourk- minimal internal hemorrhoids, pancolonic diverticulosis  . COLONOSCOPY WITH PROPOFOL N/A 08/23/2016   Procedure: COLONOSCOPY WITH PROPOFOL;  Surgeon: Danie Binder, MD;  [facility];  Service: Endoscopy;  Laterality: N/A;  . COLONOSCOPY WITH PROPOFOL N/A 12/24/2018   Pancolonic diverticulosis, normal TI, one 5 mm polyp in rectum (tubular adenoma). Somewhat friable hemorrhagic mucosa in area of IC valve/cecum, possibly related to scope trauma s/p biopsy.   . DEBRIDEMENT OF ABDOMINAL WALL ABSCESS N/A 02/08/2013   Procedure: DEBRIDEMENT OF ABDOMINAL WALL ABSCESS;  Surgeon: Jamesetta So, MD;  Location: [facility];  Service: General;  Laterality: N/A;  . ESOPHAGOGASTRODUODENOSCOPY (EGD) WITH PROPOFOL N/A 05/20/2013   Dr.Rourk- s/p prior gastric surgery with normal esophagus, residual gastric mucosa and patent efferent limb  . ESOPHAGOGASTRODUODENOSCOPY (EGD) WITH PROPOFOL N/A 02/03/2014   Dr.  Gala Romney:  s/p hemigastrectomy with retained gastric contents. Residual gastric mucosa and efferent limb appeared normal otherwise. Query gastroparesis.   Marland Kitchen ESOPHAGOGASTRODUODENOSCOPY (EGD) WITH PROPOFOL N/A 04/26/2014   [id number] ring/small HH/mild non-erosive gasrtitis/normal anastomosis  . ESOPHAGOGASTRODUODENOSCOPY (EGD) WITH PROPOFOL N/A 12/23/2014   Dr.Rourk- s/p prior hemigastrctomy, active oozing from anastomotic suture site, hemostasis achieved  . ESOPHAGOGASTRODUODENOSCOPY (EGD) WITH PROPOFOL N/A 05/23/2016   Dr. 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 ESOPHAGOGASTRODUODENOSCOPY (EGD) WITH PROPOFOL N/A 05/11/2017   Procedure: ESOPHAGOGASTRODUODENOSCOPY (EGD) WITH PROPOFOL;  Surgeon: Daneil Dolin, MD;  [facility];  Service: Endoscopy;  Laterality: N/A;  . HEMORRHOID SURGERY N/A 06/18/2017   Procedure: THREE COLUMN EXTENSIVE HEMORRHOIDECTOMY;  Surgeon: Virl Cagey, MD;  Location: [facility];  Service: General;  Laterality: N/A;  . INCISION AND DRAINAGE ABSCESS N/A 01/06/2017   Procedure: INCISION AND DRAINAGE ABDOMINAL WALL ABSCESS;  Surgeon: Aviva Signs, MD;  Location: [facility];  Service: General;  Laterality: N/A;  . POLYPECTOMY  12/24/2018   Procedure: POLYPECTOMY;  Surgeon: Daneil Dolin, MD;  [facility];  Service: Endoscopy;;  colon  . tendon repar Right    wrist  . WOUND EXPLORATION Right 06/24/2013   Procedure: exploration of traumatic wound right wrist;  Surgeon: Tennis Must, MD;  Location: [place];  Service: Orthopedics;  Laterality: Right;       History of present illness and  Hospital Course:     Kindly see H&P for history of present illness and admission details, please review complete Labs, Consult reports and Test reports for all details in brief  HPI  from the history and physical done on the day of admission 05/16/2019  HPI: Angel Price is  a 54 y.o. 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  Per triage note, when patient's boyfriend arrived home, she was found to be nonverbal and unable to move her right upper and lower extremity with facial droop.  Patient did not speak during transport but did respond at the bridge of the ED. when I entered the room, patient was nonverbal.  Soon after when nursing  staff entered the room, she started speaking.  States her boyfriend called EMS because she could not move the right side of her body.  She is not sure what time this started.  Reports having prior strokes.  No additional history could be obtained from her.  ED Course: Vital signs stable.  WBC count 3.6. Bicarb 18, anion gap 16.  Blood glucose 113.  CT head negative for acute intracranial abnormality.  CT angiogram showing minimal atherosclerosis without evidence of LVO or significant stenosis in the head and neck.  Patient was again seen by neurology today and her presentation was felt to be psychogenic in nature.  Neurology recommended starting aspirin 81 mg daily given presence of stroke risk factors and recommended psychiatry consultation if her symptoms do not resolve  Hospital Course    Acute onset right-sided hemiplegia, global aphasia: -Work-up not suggestive of acute stroke.Inconsistencies noted on exam as mentioned above.  -CT head negative for acute intracranial abnormality.  - CT angiogram showing minimal atherosclerosis without evidence of LVO or significant stenosis in the head and neck.  - Patient was again seen by neurology on admission and  her presentation was felt to be psychogenic in nature. -Started on aspirin. -PT OT consulted, home health on discharge  COVID-19 infection -No evidence of hypoxia, or opacity on imaging, no indication for steroids or remdesivir  Elevated D-dimers -Patient had an episode of atypical chest pain overnight, EKG nonacute, troponins negative x3, D-dimer was obtained which was elevated 2.78, this is most likely in the setting of COVID-19 infection, but CTA chest was obtained which was negative for PE.  Chronicallydraining abdominal wall wounds  -No need for abx as it doesn't look infected  Anion gap metabolic acidosis -Presents with significant lactic acidosis, lactic acid remains elevated despite aggressive fluid hydration, as well she was noted to have hyperkalemia with IV normal saline, so it has been changed to bicarb, given low bicarbonate level, this has resolved. -No evidence of infection, most likely in the setting of alcohol abuse and intoxication  Current tobacco use:  -Smoking 1/2 pack of cigarettes daily. Nicotine patch ordered. Needs continued counseling on smoking cessation.  Hypertension: -Blood pressure is elevated, she was given IV Lasix during hospital stay.  Alcohol abuse/intoxication -Ethanol level elevated on admission, she endorses heavy alcohol use to time 25 ounce beers daily to ambulate weekly, she was kept on CIWA protocol during hospital stay, but no evidence of withdrawals.    Discharge Condition:  stable   Follow UP  Follow-up Information    Rosita Fire, MD Follow up in 3 week(s).   Specialty: Internal Medicine Contact information: [place] [address] [phone]             Discharge Instructions  and  Discharge Medications    Discharge Instructions    Diet - low sodium heart healthy   Complete by: As directed    Increase activity slowly   Complete by: As directed      Allergies as of 05/18/2019       Reactions   Codeine Hives   Morphine And Related Hives   Reglan [metoclopramide] Other (See Comments)   EPS symptoms       Medication List    STOP taking these medications   cyclobenzaprine 5 MG tablet Commonly known as: FLEXERIL   omeprazole 20 MG capsule Commonly known as: PRILOSEC   ondansetron 4 MG disintegrating tablet Commonly known as: Zofran ODT   traMADol 50 MG  tablet Commonly known as: ULTRAM     TAKE these medications   acetaminophen 500 MG tablet Commonly known as: TYLENOL Take 2 tablets (1,000 mg total) by mouth 3 (three) times daily as needed for mild pain or moderate pain.   amLODipine 10 MG tablet Commonly known as: NORVASC Take 0.5 tablets (5 mg total) by mouth daily. What changed: how much to take   aspirin 81 MG EC tablet Take 1 tablet (81 mg total) by mouth daily. Start taking on: May 19, 2019   atenolol 50 MG tablet Commonly known as: TENORMIN Take 50 mg by mouth daily.   dicyclomine 10 MG capsule Commonly known as: Bentyl Take 1 capsule (10 mg total) by mouth 4 (four) times daily -  before meals and at bedtime. For diarrhea and abdominal cramps   escitalopram 20 MG tablet Commonly known as: LEXAPRO Take 20 mg daily by mouth.   folic acid 1 MG tablet Commonly known as: FOLVITE Take 1 tablet (1 mg total) by mouth daily.   mupirocin cream 2 % Commonly known as: BACTROBAN Apply topically 2 (two) times daily.   nicotine 14 mg/24hr patch Commonly known as: NICODERM CQ - dosed in mg/24 hours Place 1 patch (14 mg total) onto the skin daily. Start taking on: May 19, 2019   ondansetron 4 MG tablet Commonly known as: ZOFRAN Take 1 tablet (4 mg total) by mouth every 6 (six) hours as needed for nausea.   pantoprazole 40 MG tablet Commonly known as: PROTONIX Take 40 mg by mouth daily.   promethazine 25 MG suppository Commonly known as: PHENERGAN Place 1 suppository (25 mg total) rectally every 6 (six) hours as needed for nausea or  vomiting.   thiamine 100 MG tablet Take 1 tablet (100 mg total) by mouth daily.   vitamin B-12 1000 MCG tablet Commonly known as: CYANOCOBALAMIN Take 1,000 mcg by mouth daily.         Diet and Activity recommendation: See Discharge Instructions above   Consults obtained -  Neurology Psychiatry   Major procedures and Radiology Reports - PLEASE review detailed and final reports for all details, in brief -      CT Code Stroke CTA Head W/WO contrast  Result Date: 05/16/2019 CLINICAL DATA:  Right-sided weakness and aphasia. EXAM: CT ANGIOGRAPHY HEAD AND NECK TECHNIQUE: Multidetector CT imaging of the head and neck was performed using the standard protocol during bolus administration of intravenous contrast. Multiplanar CT image reconstructions and MIPs were obtained to evaluate the vascular anatomy. Carotid stenosis measurements (when applicable) are obtained utilizing NASCET criteria, using the distal internal carotid diameter as the denominator. CONTRAST:  138mL OMNIPAQUE IOHEXOL 350 MG/ML SOLN COMPARISON:  12/20/2016 FINDINGS: CTA NECK FINDINGS Aortic arch: Normal variant aortic arch branching pattern with common origin of the brachiocephalic and left common carotid arteries. Minimal atherosclerotic calcification in the aortic arch. Widely patent arch vessel origins. Right carotid system: Patent and smooth without evidence of stenosis or dissection. Left carotid system: Patent and smooth without evidence of stenosis or dissection. Vertebral arteries: Patent, smooth, and codominant without evidence of stenosis or dissection. Skeleton: Moderate disc degeneration at C5-6. Other neck: No evidence of cervical lymphadenopathy or mass. Upper chest: Clear lung apices. Review of the MIP images confirms the above findings CTA HEAD FINDINGS Anterior circulation: The internal carotid arteries are patent from skull base to carotid termini with minimal nonstenotic plaque bilaterally. ACAs and MCAs are  patent without evidence of proximal branch occlusion or significant proximal stenosis.  No aneurysm is identified. Posterior circulation: The intracranial vertebral arteries are widely patent to the basilar. Patent PICAs and SCAs are seen bilaterally. The basilar artery is widely patent. There are right larger than left posterior communicating arteries. Both PCAs are patent without evidence of significant proximal stenosis. No aneurysm is identified. Venous sinuses: As permitted by contrast timing, patent. Anatomic variants: None. Review of the MIP images confirms the above findings IMPRESSION: 1. Minimal atherosclerosis without large vessel occlusion or significant stenosis in the head and neck. 2.  Aortic Atherosclerosis (ICD10-I70.0). These results were communicated to Dr. Cheral Marker at 7:50 pm on 05/16/2019 by text page via the [facility] messaging system. Electronically Signed   By: Logan Bores M.D.   On: 05/16/2019 19:55   CT Code Stroke CTA Neck W/WO contrast  Result Date: 05/16/2019 CLINICAL DATA:  Right-sided weakness and aphasia. EXAM: CT ANGIOGRAPHY HEAD AND NECK TECHNIQUE: Multidetector CT imaging of the head and neck was performed using the standard protocol during bolus administration of intravenous contrast. Multiplanar CT image reconstructions and MIPs were obtained to evaluate the vascular anatomy. Carotid stenosis measurements (when applicable) are obtained utilizing NASCET criteria, using the distal internal carotid diameter as the denominator. CONTRAST:  158mL OMNIPAQUE IOHEXOL 350 MG/ML SOLN COMPARISON:  12/20/2016 FINDINGS: CTA NECK FINDINGS Aortic arch: Normal variant aortic arch branching pattern with common origin of the brachiocephalic and left common carotid arteries. Minimal atherosclerotic calcification in the aortic arch. Widely patent arch vessel origins. Right carotid system: Patent and smooth without evidence of stenosis or dissection. Left carotid system: Patent and smooth without  evidence of stenosis or dissection. Vertebral arteries: Patent, smooth, and codominant without evidence of stenosis or dissection. Skeleton: Moderate disc degeneration at C5-6. Other neck: No evidence of cervical lymphadenopathy or mass. Upper chest: Clear lung apices. Review of the MIP images confirms the above findings CTA HEAD FINDINGS Anterior circulation: The internal carotid arteries are patent from skull base to carotid termini with minimal nonstenotic plaque bilaterally. ACAs and MCAs are patent without evidence of proximal branch occlusion or significant proximal stenosis. No aneurysm is identified. Posterior circulation: The intracranial vertebral arteries are widely patent to the basilar. Patent PICAs and SCAs are seen bilaterally. The basilar artery is widely patent. There are right larger than left posterior communicating arteries. Both PCAs are patent without evidence of significant proximal stenosis. No aneurysm is identified. Venous sinuses: As permitted by contrast timing, patent. Anatomic variants: None. Review of the MIP images confirms the above findings IMPRESSION: 1. Minimal atherosclerosis without large vessel occlusion or significant stenosis in the head and neck. 2.  Aortic Atherosclerosis (ICD10-I70.0). These results were communicated to Dr. Cheral Marker at 7:50 pm on 05/16/2019 by text page via the [facility] messaging system. Electronically Signed   By: Logan Bores M.D.   On: 05/16/2019 19:55   CT ANGIO CHEST PE W OR WO CONTRAST  Result Date: 05/18/2019 CLINICAL DATA:  54 year old female with chest pain and abnormal D-dimer. Positive COVID-19 on 05/16/2019. EXAM: CT ANGIOGRAPHY CHEST WITH CONTRAST TECHNIQUE: Multidetector CT imaging of the chest was performed using the standard protocol during bolus administration of intravenous contrast. Multiplanar CT image reconstructions and MIPs were obtained to evaluate the vascular anatomy. CONTRAST:  168mL OMNIPAQUE IOHEXOL 350 MG/ML SOLN COMPARISON:  CT  Abdomen and Pelvis 02/23/2019. Portable chest 11/11/2018. FINDINGS: Cardiovascular: Excellent contrast bolus timing in the pulmonary arterial tree. No focal filling defect identified in the pulmonary arteries to suggest acute pulmonary embolism. Calcified coronary artery atherosclerosis (series 7, image  157). Calcified aortic atherosclerosis. Little contrast in the aorta. No cardiomegaly or pericardial effusion. Mediastinum/Nodes: Negative. No lymphadenopathy. Lungs/Pleura: Major airways are patent. Lower lung volumes compared to the December CT. Dependent opacity most resembles atelectasis. Mild additional nonspecific bilateral pulmonary ground-glass opacity, although mostly dependent. And there is platelike opacity in both the anterior right upper lobe and superior left lower lobe. No pleural effusion. Upper Abdomen: Scattered surgical clips in the upper abdomen are redemonstrated. Stable visible upper abdominal viscera including a low-density left adrenal adenoma. Musculoskeletal: No acute osseous abnormality identified. Review of the MIP images confirms the above findings. IMPRESSION: 1. Negative for pulmonary embolus. 2. Lower lung volumes from a prior CT with bilateral atelectasis. Additional mild nonspecific pulmonary ground-glass opacity such that COVID-19 pneumonia is difficult to exclude. 3. Calcified coronary artery and Aortic atherosclerosis (ICD10-I70.0). Electronically Signed   By: Genevie Ann M.D.   On: 05/18/2019 13:55   MR BRAIN WO CONTRAST  Result Date: 05/09/2019 CLINICAL DATA:  Headache and right upper extremity/lower extremity weakness for 24 hours EXAM: MRI HEAD WITHOUT CONTRAST TECHNIQUE: Multiplanar, multiecho pulse sequences of the brain and surrounding structures were obtained without intravenous contrast. COMPARISON:  12/20/2016 FINDINGS: Brain: No acute infarction, hemorrhage, hydrocephalus, extra-axial collection or mass lesion. Two tiny FLAIR hyperintensities in the cerebral white  matter, allowable for age and nonspecific. At least 1 of these was seen on prior. Brain volume is normal. Vascular: Normal flow voids Skull and upper cervical spine: Normal marrow signal Sinuses/Orbits: Chronic nasal septum defect. IMPRESSION: No emergent finding or significant change from 2018. Electronically Signed   By: Monte Fantasia M.D.   On: 05/09/2019 12:25   DG ABD ACUTE 2+V W 1V CHEST  Result Date: 05/02/2019 CLINICAL DATA:  Abdomen pain with vomiting EXAM: DG ABDOMEN ACUTE W/ 1V CHEST COMPARISON:  07/02/2017, CT 02/23/2019, chest x-ray 11/11/2018 FINDINGS: Single-view chest demonstrates possible subtle peripheral ground-glass opacity. Normal heart size. No pneumothorax. Supine and upright views of the abdomen demonstrate no free air beneath the diaphragm. Postsurgical changes in the upper abdomen. Nonobstructed gas pattern. : 1. Nonobstructed gas pattern. 2. Possible subtle peripheral ground-glass opacities in the left mid to upper lung, question atypical or viral process. Electronically Signed   By: Donavan Foil M.D.   On: 05/02/2019 23:48   CT HEAD CODE STROKE WO CONTRAST  Result Date: 05/16/2019 CLINICAL DATA:  Code stroke.  Right-sided weakness and aphasia. EXAM: CT HEAD WITHOUT CONTRAST TECHNIQUE: Contiguous axial images were obtained from the base of the skull through the vertex without intravenous contrast. COMPARISON:  Head MRI 05/09/2019 FINDINGS: Brain: There is no evidence of acute infarct, intracranial hemorrhage, mass, midline shift, or extra-axial fluid collection. A partially empty sella is unchanged. The ventricles and sulci are normal. Vascular: Calcified atherosclerosis at the skull base. No hyperdense vessel. Skull: No fracture or suspicious osseous lesion. Sinuses/Orbits: Visualized paranasal sinuses and mastoid air cells are clear. Orbits are unremarkable. Other: None. ASPECTS [facility] Stroke Program Early CT Score) - Ganglionic level infarction (caudate, lentiform nuclei,  internal capsule, insula, M1-M3 cortex): 7 - Supraganglionic infarction (M4-M6 cortex): 3 Total score (0-10 with 10 being normal): 10 IMPRESSION: 1. No evidence of acute intracranial abnormality. 2. ASPECTS is 10. These results were communicated to Dr. Cheral Marker at 7:26 pm on 05/16/2019 by text page via the [facility] messaging system. Electronically Signed   By: Logan Bores M.D.   On: 05/16/2019 19:26    Micro Results    Recent Results (from the past 240 hour(s))  SARS CORONAVIRUS 2 (TAT 6-24 HRS) Nasopharyngeal Nasopharyngeal Swab     Status: None   Collection Time: 05/09/19  3:57 PM   Specimen: Nasopharyngeal Swab  Result Value Ref Range Status   SARS Coronavirus 2 NEGATIVE NEGATIVE Final    Comment: (NOTE) SARS-CoV-2 target nucleic acids are NOT DETECTED. The SARS-CoV-2 RNA is generally detectable in upper and lower respiratory specimens during the acute phase of infection. Negative results do not preclude SARS-CoV-2 infection, do not rule out co-infections with other pathogens, and should not be used as the sole basis for treatment or other patient management decisions. Negative results must be combined with clinical observations, patient history, and epidemiological information. The expected result is Negative. Fact Sheet for Patients: SugarRoll.be Fact Sheet for Healthcare Providers: https://www.woods-mathews.com/ This test is not yet approved or cleared by the [place] FDA and  has been authorized for detection and/or diagnosis of SARS-CoV-2 by FDA under an Emergency Use Authorization (EUA). This EUA will remain  in effect (meaning this test can be used) for the duration of the COVID-19 declaration under Section 56 4(b)(1) of the Act, 21 U.S.C. section 360bbb-3(b)(1), unless the authorization is terminated or revoked sooner. Performed at [facility], [place] [address] [place], [place] [address]   SARS CORONAVIRUS 2 (TAT 6-24 HRS)  Nasopharyngeal Nasopharyngeal Swab     Status: Abnormal   Collection Time: 05/16/19 10:43 PM   Specimen: Nasopharyngeal Swab  Result Value Ref Range Status   SARS Coronavirus 2 POSITIVE (A) NEGATIVE Final    Comment: RESULT CALLED TO, READ BACK BY AND VERIFIED WITH: P. OBASOGIE ASIDI,RN [id number] 05/17/2019 T. TYSOR (NOTE) SARS-CoV-2 target nucleic acids are DETECTED. The SARS-CoV-2 RNA is generally detectable in upper and lower respiratory specimens during the acute phase of infection. Positive results are indicative of the presence of SARS-CoV-2 RNA. Clinical correlation with patient history and other diagnostic information is  necessary to determine patient infection status. Positive results do not rule out bacterial infection or co-infection with other viruses.  The expected result is Negative. Fact Sheet for Patients: SugarRoll.be Fact Sheet for Healthcare Providers: [email or web address]-[email or web address] This test is not yet approved or cleared by the [place] FDA and  has been authorized for detection and/or diagnosis of SARS-CoV-2 by FDA under an Emergency Use Authorization (EUA). This EUA will remain  in effect (meaning this test can be use d) for the duration of the COVID-19 declaration under Section 564(b)(1) of the Act, 21 U.S.C. section 360bbb-3(b)(1), unless the authorization is terminated or revoked sooner. Performed at [facility], [place] [address], [place], [place] [address]   Culture, blood (Routine X 2) w Reflex to ID Panel     Status: None (Preliminary result)   Collection Time: 05/17/19  7:12 AM   Specimen: BLOOD  Result Value Ref Range Status   Specimen Description BLOOD LEFT ANTECUBITAL  Final   Special Requests   Final    BOTTLES DRAWN AEROBIC AND ANAEROBIC Blood Culture adequate volume Performed at [facility], [place] [address], [place], [place] [address]    Culture NO GROWTH 1 DAY  Final   Report Status  PENDING  Incomplete  Culture, blood (Routine X 2) w Reflex to ID Panel     Status: None (Preliminary result)   Collection Time: 05/17/19  7:15 AM   Specimen: BLOOD LEFT HAND  Result Value Ref Range Status   Specimen Description BLOOD LEFT HAND  Final   Special Requests   Final  BOTTLES DRAWN AEROBIC AND ANAEROBIC Blood Culture results may not be optimal due to an inadequate volume of blood received in culture bottles Performed at [facility], [address] [place],  [address]    Culture NO GROWTH 1 DAY  Final   Report Status PENDING  Incomplete       Today   Subjective:   Katenia Brotman today has no headache,abdominal pain,no new weakness tingling or numbness, she had an episode of musculoskeletal chest pain overnight, this has resolved.  Objective:   Blood pressure (!) 135/94, pulse 66, temperature 98.5 F (36.9 C), temperature source Oral, resp. rate 17, height 5\' 3"  (1.6 m), weight 60.1 kg, SpO2 96 %.   Intake/Output Summary (Last 24 hours) at 05/18/2019 1805 Last data filed at 05/18/2019 1416 Gross per 24 hour  Intake 1320 ml  Output 3100 ml  Net -1780 ml    Exam Awake Alert, Oriented x 3, No new F.N deficits, Normal affect Symmetrical Chest wall movement, Good air movement bilaterally, CTAB RRR,No Gallops,Rubs or new Murmurs, No Parasternal Heave +ve B.Sounds, Abd Soft, No rebound -guarding or rigidity. No Cyanosis, Clubbing or edema, No new Rash or bruise  Data Review   CBC w Diff:  Lab Results  Component Value Date   WBC 5.5 05/18/2019   HGB 11.2 (L) 05/18/2019   HCT 31.9 (L) 05/18/2019   HCT 26.3 (L) 10/05/2015   PLT 287 05/18/2019   LYMPHOPCT 46 05/16/2019   BANDSPCT 0 12/10/2013   MONOPCT 8 05/16/2019   EOSPCT 1 05/16/2019   BASOPCT 1 05/16/2019    CMP:  Lab Results  Component Value Date   NA 137 05/18/2019   K 3.2 (L) 05/18/2019   CL 100 05/18/2019   CO2 27 05/18/2019   BUN <5 (L) 05/18/2019   CREATININE 0.56 05/18/2019    CREATININE 0.59 03/16/2013   PROT 6.6 05/17/2019   ALBUMIN 3.4 (L) 05/17/2019   BILITOT 0.6 05/17/2019   ALKPHOS 56 05/17/2019   AST 35 05/17/2019   ALT 27 05/17/2019  .   Total Time in preparing paper work, data evaluation and todays exam - 52 minutes  Phillips Climes M.D on 05/18/2019 at 6:05 PM  [facility]   Office  [phone]

## 2019-05-18 NOTE — Discharge Instructions (Signed)
Person Under Monitoring Name: Angel Price  Location: [address] [place] [address]   Infection Prevention Recommendations for Individuals Confirmed to have, or Being Evaluated for, 2019 Novel Coronavirus (COVID-19) Infection Who Receive Care at Home  Individuals who are confirmed to have, or are being evaluated for, COVID-19 should follow the prevention steps below until a healthcare provider or local or state health department says they can return to normal activities.  Stay home except to get medical care You should restrict activities outside your home, except for getting medical care. Do not go to work, school, or public areas, and do not use public transportation or taxis.  Call ahead before visiting your doctor Before your medical appointment, call the healthcare provider and tell them that you have, or are being evaluated for, COVID-19 infection. This will help the healthcare provider's office take steps to keep other people from getting infected. Ask your healthcare provider to call the local or state health department.  Monitor your symptoms Seek prompt medical attention if your illness is worsening (e.g., difficulty breathing). Before going to your medical appointment, call the healthcare provider and tell them that you have, or are being evaluated for, COVID-19 infection. Ask your healthcare provider to call the local or state health department.  Wear a facemask You should wear a facemask that covers your nose and mouth when you are in the same room with other people and when you visit a healthcare provider. People who live with or visit you should also wear a facemask while they are in the same room with you.  Separate yourself from other people in your home As much as possible, you should stay in a different room from other people in your home. Also, you should use a separate bathroom, if available.  Avoid sharing household items You  should not share dishes, drinking glasses, cups, eating utensils, towels, bedding, or other items with other people in your home. After using these items, you should wash them thoroughly with soap and water.  Cover your coughs and sneezes Cover your mouth and nose with a tissue when you cough or sneeze, or you can cough or sneeze into your sleeve. Throw used tissues in a lined trash can, and immediately wash your hands with soap and water for at least 20 seconds or use an alcohol-based hand rub.  Wash your [facility] your hands often and thoroughly with soap and water for at least 20 seconds. You can use an alcohol-based hand sanitizer if soap and water are not available and if your hands are not visibly dirty. Avoid touching your eyes, nose, and mouth with unwashed hands.   Prevention Steps for Caregivers and Household Members of Individuals Confirmed to have, or Being Evaluated for, COVID-19 Infection Being Cared for in the Home  If you live with, or provide care at home for, a person confirmed to have, or being evaluated for, COVID-19 infection please follow these guidelines to prevent infection:  Follow healthcare provider's instructions Make sure that you understand and can help the patient follow any healthcare provider instructions for all care.  Provide for the patient's basic needs You should help the patient with basic needs in the home and provide support for getting groceries, prescriptions, and other personal needs.  Monitor the patient's symptoms If they are getting sicker, call his or her medical provider and tell them that the patient has, or is being evaluated for, COVID-19 infection. This will help the  healthcare provider's office take steps to keep other people from getting infected. Ask the healthcare provider to call the local or state health department.  Limit the number of people who have contact with the patient  If possible, have only one caregiver for the  patient.  Other household members should stay in another home or place of residence. If this is not possible, they should stay  in another room, or be separated from the patient as much as possible. Use a separate bathroom, if available.  Restrict visitors who do not have an essential need to be in the home.  Keep older adults, very young children, and other sick people away from the patient Keep older adults, very young children, and those who have compromised immune systems or chronic health conditions away from the patient. This includes people with chronic heart, lung, or kidney conditions, diabetes, and cancer.  Ensure good ventilation Make sure that shared spaces in the home have good air flow, such as from an air conditioner or an opened window, weather permitting.  Wash your hands often  Wash your hands often and thoroughly with soap and water for at least 20 seconds. You can use an alcohol based hand sanitizer if soap and water are not available and if your hands are not visibly dirty.  Avoid touching your eyes, nose, and mouth with unwashed hands.  Use disposable paper towels to dry your hands. If not available, use dedicated cloth towels and replace them when they become wet.  Wear a facemask and gloves  Wear a disposable facemask at all times in the room and gloves when you touch or have contact with the patient's blood, body fluids, and/or secretions or excretions, such as sweat, saliva, sputum, nasal mucus, vomit, urine, or feces.  Ensure the mask fits over your nose and mouth tightly, and do not touch it during use.  Throw out disposable facemasks and gloves after using them. Do not reuse.  Wash your hands immediately after removing your facemask and gloves.  If your personal clothing becomes contaminated, carefully remove clothing and launder. Wash your hands after handling contaminated clothing.  Place all used disposable facemasks, gloves, and other waste in a lined  container before disposing them with other household waste.  Remove gloves and wash your hands immediately after handling these items.  Do not share dishes, glasses, or other household items with the patient  Avoid sharing household items. You should not share dishes, drinking glasses, cups, eating utensils, towels, bedding, or other items with a patient who is confirmed to have, or being evaluated for, COVID-19 infection.  After the person uses these items, you should wash them thoroughly with soap and water.  Wash laundry thoroughly  Immediately remove and wash clothes or bedding that have blood, body fluids, and/or secretions or excretions, such as sweat, saliva, sputum, nasal mucus, vomit, urine, or feces, on them.  Wear gloves when handling laundry from the patient.  Read and follow directions on labels of laundry or clothing items and detergent. In general, wash and dry with the warmest temperatures recommended on the label.  Clean all areas the individual has used often  Clean all touchable surfaces, such as counters, tabletops, doorknobs, bathroom fixtures, toilets, phones, keyboards, tablets, and bedside tables, every day. Also, clean any surfaces that may have blood, body fluids, and/or secretions or excretions on them.  Wear gloves when cleaning surfaces the patient has come in contact with.  Use a diluted bleach solution (e.g., dilute bleach  with 1 part bleach and 10 parts water) or a household disinfectant with a label that says EPA-registered for coronaviruses. To make a bleach solution at home, add 1 tablespoon of bleach to 1 quart (4 cups) of water. For a larger supply, add  cup of bleach to 1 gallon (16 cups) of water.  Read labels of cleaning products and follow recommendations provided on product labels. Labels contain instructions for safe and effective use of the cleaning product including precautions you should take when applying the product, such as wearing gloves or  eye protection and making sure you have good ventilation during use of the product.  Remove gloves and wash hands immediately after cleaning.  Monitor yourself for signs and symptoms of illness Caregivers and household members are considered close contacts, should monitor their health, and will be asked to limit movement outside of the home to the extent possible. Follow the monitoring steps for close contacts listed on the symptom monitoring form.   ? If you have additional questions, contact your local health department or call the epidemiologist on call at [phone] (available 24/7). ? This guidance is subject to change. For the most up-to-date guidance from [facility], please refer to their website: [email or web address]

## 2019-05-19 ENCOUNTER — Inpatient Hospital Stay (HOSPITAL_COMMUNITY)
Admission: EM | Admit: 2019-05-19 | Discharge: 2019-05-22 | DRG: 177 | Disposition: A | Discharge status: Home Health | Payer: Medicare Other | Attending: Internal Medicine | Admitting: Internal Medicine

## 2019-05-19 ENCOUNTER — Emergency Department (HOSPITAL_COMMUNITY): Payer: Medicare Other

## 2019-05-19 ENCOUNTER — Encounter (HOSPITAL_BASED_OUTPATIENT_CLINIC_OR_DEPARTMENT_OTHER): Payer: Medicare Other | Admitting: Physician Assistant

## 2019-05-19 DIAGNOSIS — I10 Essential (primary) hypertension: Secondary | ICD-10-CM | POA: Diagnosis not present

## 2019-05-19 DIAGNOSIS — Y907 Blood alcohol level of 200-239 mg/100 ml: Secondary | ICD-10-CM | POA: Diagnosis present

## 2019-05-19 DIAGNOSIS — Z79899 Other long term (current) drug therapy: Secondary | ICD-10-CM

## 2019-05-19 DIAGNOSIS — F329 Major depressive disorder, single episode, unspecified: Secondary | ICD-10-CM | POA: Diagnosis present

## 2019-05-19 DIAGNOSIS — F17210 Nicotine dependence, cigarettes, uncomplicated: Secondary | ICD-10-CM | POA: Diagnosis present

## 2019-05-19 DIAGNOSIS — M329 Systemic lupus erythematosus, unspecified: Secondary | ICD-10-CM | POA: Diagnosis present

## 2019-05-19 DIAGNOSIS — R404 Transient alteration of awareness: Secondary | ICD-10-CM | POA: Diagnosis not present

## 2019-05-19 DIAGNOSIS — Z7982 Long term (current) use of aspirin: Secondary | ICD-10-CM

## 2019-05-19 DIAGNOSIS — R7989 Other specified abnormal findings of blood chemistry: Secondary | ICD-10-CM | POA: Diagnosis not present

## 2019-05-19 DIAGNOSIS — U071 COVID-19: Secondary | ICD-10-CM

## 2019-05-19 DIAGNOSIS — R079 Chest pain, unspecified: Secondary | ICD-10-CM | POA: Diagnosis not present

## 2019-05-19 DIAGNOSIS — Z743 Need for continuous supervision: Secondary | ICD-10-CM | POA: Diagnosis not present

## 2019-05-19 DIAGNOSIS — J9601 Acute respiratory failure with hypoxia: Secondary | ICD-10-CM | POA: Diagnosis not present

## 2019-05-19 DIAGNOSIS — R0602 Shortness of breath: Secondary | ICD-10-CM

## 2019-05-19 DIAGNOSIS — Z801 Family history of malignant neoplasm of trachea, bronchus and lung: Secondary | ICD-10-CM

## 2019-05-19 DIAGNOSIS — T8189XA Other complications of procedures, not elsewhere classified, initial encounter: Secondary | ICD-10-CM | POA: Diagnosis present

## 2019-05-19 DIAGNOSIS — F10920 Alcohol use, unspecified with intoxication, uncomplicated: Secondary | ICD-10-CM | POA: Diagnosis not present

## 2019-05-19 DIAGNOSIS — E162 Hypoglycemia, unspecified: Secondary | ICD-10-CM | POA: Diagnosis not present

## 2019-05-19 DIAGNOSIS — F10129 Alcohol abuse with intoxication, unspecified: Secondary | ICD-10-CM | POA: Diagnosis present

## 2019-05-19 DIAGNOSIS — J690 Pneumonitis due to inhalation of food and vomit: Secondary | ICD-10-CM | POA: Diagnosis not present

## 2019-05-19 DIAGNOSIS — Z0389 Encounter for observation for other suspected diseases and conditions ruled out: Secondary | ICD-10-CM | POA: Diagnosis not present

## 2019-05-19 DIAGNOSIS — R55 Syncope and collapse: Secondary | ICD-10-CM | POA: Diagnosis present

## 2019-05-19 DIAGNOSIS — E161 Other hypoglycemia: Secondary | ICD-10-CM | POA: Diagnosis not present

## 2019-05-19 DIAGNOSIS — K861 Other chronic pancreatitis: Secondary | ICD-10-CM | POA: Diagnosis present

## 2019-05-19 DIAGNOSIS — R4182 Altered mental status, unspecified: Secondary | ICD-10-CM | POA: Diagnosis not present

## 2019-05-19 DIAGNOSIS — E236 Other disorders of pituitary gland: Secondary | ICD-10-CM | POA: Diagnosis not present

## 2019-05-19 LAB — CBC WITH DIFFERENTIAL/PLATELET
Abs Immature Granulocytes: 0.03 10*3/uL (ref 0.00–0.07)
Basophils Absolute: 0 10*3/uL (ref 0.0–0.1)
Basophils Relative: 0 %
Eosinophils Absolute: 0.1 10*3/uL (ref 0.0–0.5)
Eosinophils Relative: 2 %
HCT: 35.3 % — ABNORMAL LOW (ref 36.0–46.0)
Hemoglobin: 12.1 g/dL (ref 12.0–15.0)
Immature Granulocytes: 1 %
Lymphocytes Relative: 38 %
Lymphs Abs: 2.5 10*3/uL (ref 0.7–4.0)
MCH: 28.5 pg (ref 26.0–34.0)
MCHC: 34.3 g/dL (ref 30.0–36.0)
MCV: 83.1 fL (ref 80.0–100.0)
Monocytes Absolute: 0.5 10*3/uL (ref 0.1–1.0)
Monocytes Relative: 7 %
Neutro Abs: 3.4 10*3/uL (ref 1.7–7.7)
Neutrophils Relative %: 52 %
Platelets: 280 10*3/uL (ref 150–400)
RBC: 4.25 MIL/uL (ref 3.87–5.11)
RDW: 14.4 % (ref 11.5–15.5)
WBC: 6.5 10*3/uL (ref 4.0–10.5)
nRBC: 0.6 % — ABNORMAL HIGH (ref 0.0–0.2)

## 2019-05-19 LAB — COMPREHENSIVE METABOLIC PANEL
ALT: 30 U/L (ref 0–44)
AST: 51 U/L — ABNORMAL HIGH (ref 15–41)
Albumin: 3.6 g/dL (ref 3.5–5.0)
Alkaline Phosphatase: 77 U/L (ref 38–126)
Anion gap: 12 (ref 5–15)
BUN: 5 mg/dL — ABNORMAL LOW (ref 6–20)
CO2: 21 mmol/L — ABNORMAL LOW (ref 22–32)
Calcium: 9 mg/dL (ref 8.9–10.3)
Chloride: 95 mmol/L — ABNORMAL LOW (ref 98–111)
Creatinine, Ser: 0.54 mg/dL (ref 0.44–1.00)
GFR calc Af Amer: >60 mL/min (ref >60)
GFR calc non Af Amer: >60 mL/min (ref >60)
Glucose, Bld: 81 mg/dL (ref 70–99)
Potassium: 4.5 mmol/L (ref 3.5–5.1)
Sodium: 128 mmol/L — ABNORMAL LOW (ref 135–145)
Total Bilirubin: 0.7 mg/dL (ref 0.3–1.2)
Total Protein: 7.4 g/dL (ref 6.5–8.1)

## 2019-05-19 LAB — I-STAT CHEM 8, ED
BUN: 4 mg/dL — ABNORMAL LOW (ref 6–20)
Calcium, Ion: 1.08 mmol/L — ABNORMAL LOW (ref 1.15–1.40)
Chloride: 96 mmol/L — ABNORMAL LOW (ref 98–111)
Creatinine, Ser: 0.7 mg/dL (ref 0.44–1.00)
Glucose, Bld: 83 mg/dL (ref 70–99)
HCT: 37 % (ref 36.0–46.0)
Hemoglobin: 12.6 g/dL (ref 12.0–15.0)
Potassium: 4.4 mmol/L (ref 3.5–5.1)
Sodium: 131 mmol/L — ABNORMAL LOW (ref 135–145)
TCO2: 23 mmol/L (ref 22–32)

## 2019-05-19 LAB — D-DIMER, QUANTITATIVE: D-Dimer, Quant: 2.87 ug/mL-FEU — ABNORMAL HIGH (ref 0.00–0.50)

## 2019-05-19 LAB — C-REACTIVE PROTEIN: CRP: 0.9 mg/dL (ref <1.0)

## 2019-05-19 LAB — RAPID URINE DRUG SCREEN, HOSP PERFORMED
Amphetamines: NOT DETECTED
Barbiturates: NOT DETECTED
Benzodiazepines: NOT DETECTED
Cocaine: NOT DETECTED
Opiates: NOT DETECTED
Tetrahydrocannabinol: NOT DETECTED

## 2019-05-19 LAB — TROPONIN I (HIGH SENSITIVITY)
Troponin I (High Sensitivity): 2 ng/L (ref <18)
Troponin I (High Sensitivity): 4 ng/L (ref <18)

## 2019-05-19 LAB — SALICYLATE LEVEL: Salicylate Lvl: <7 mg/dL — ABNORMAL LOW (ref 7.0–30.0)

## 2019-05-19 LAB — FERRITIN: Ferritin: 88 ng/mL (ref 11–307)

## 2019-05-19 LAB — FIBRINOGEN: Fibrinogen: 455 mg/dL (ref 210–475)

## 2019-05-19 LAB — LACTATE DEHYDROGENASE: LDH: 143 U/L (ref 98–192)

## 2019-05-19 LAB — ACETAMINOPHEN LEVEL: Acetaminophen (Tylenol), Serum: 11 ug/mL (ref 10–30)

## 2019-05-19 LAB — PROCALCITONIN: Procalcitonin: <0.1 ng/mL

## 2019-05-19 LAB — ETHANOL: Alcohol, Ethyl (B): 225 mg/dL — ABNORMAL HIGH (ref <10)

## 2019-05-19 LAB — TRIGLYCERIDES: Triglycerides: 270 mg/dL — ABNORMAL HIGH (ref <150)

## 2019-05-19 MED ORDER — PROMETHAZINE HCL 25 MG/ML IJ SOLN
12.5000 mg | Freq: Once | INTRAMUSCULAR | Status: AC
  Administered 2019-05-19: 20:00:00 12.5 mg via INTRAVENOUS
  Filled 2019-05-19: qty 1

## 2019-05-19 MED ORDER — FENTANYL CITRATE (PF) 100 MCG/2ML IJ SOLN
50.0000 ug | Freq: Once | INTRAMUSCULAR | Status: AC
  Administered 2019-05-19: 50 ug via INTRAVENOUS
  Filled 2019-05-19: qty 2

## 2019-05-19 MED ORDER — FENTANYL CITRATE (PF) 100 MCG/2ML IJ SOLN
50.0000 ug | Freq: Once | INTRAMUSCULAR | Status: AC
  Administered 2019-05-19: 17:00:00 50 ug via INTRAVENOUS
  Filled 2019-05-19: qty 2

## 2019-05-19 MED ORDER — ONDANSETRON HCL 4 MG/2ML IJ SOLN
4.0000 mg | Freq: Once | INTRAMUSCULAR | Status: AC
  Administered 2019-05-19: 17:00:00 4 mg via INTRAVENOUS
  Filled 2019-05-19: qty 2

## 2019-05-19 MED ORDER — DEXAMETHASONE SODIUM PHOSPHATE 10 MG/ML IJ SOLN
10.0000 mg | Freq: Once | INTRAMUSCULAR | Status: AC
  Administered 2019-05-19: 19:00:00 10 mg via INTRAVENOUS
  Filled 2019-05-19: qty 1

## 2019-05-19 MED ORDER — SODIUM CHLORIDE 0.9 % IV BOLUS
1000.0000 mL | Freq: Once | INTRAVENOUS | Status: AC
  Administered 2019-05-19: 17:00:00 1000 mL via INTRAVENOUS

## 2019-05-19 NOTE — ED Notes (Signed)
NRB removed and pt placed on 4 liters N/C

## 2019-05-19 NOTE — ED Notes (Signed)
Patient in bed, VSS. Sp02 remained at 100% on 2L supplemental O2 via [place]. Patient talking to family via phone NAD.

## 2019-05-19 NOTE — ED Provider Notes (Signed)
[facility] EMERGENCY DEPARTMENT Provider Note   CSN: [id number] Arrival date & time: 05/19/19  1623     History No chief complaint on file.   Angel Price is a 54 y.o. female hx of recent stroke with R sided weakness, polysubstance abuse, here with AMS, SOB, weakness. Patient was with family and daughter called EMS because she was altered and short of breath. Fire department got there and unable to find a pulse so CPR was initiated. When EMS got there, she had a pulse and patient was bagged for 10 minutes. Patient is awake on arrival. Complains of left sided headache and shortness of breath.   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 She was admitted and discharge from the hospital yesterday. She also tested positive for COVID on 2/28.   The history is provided by the patient and the EMS personnel.  Level V caveat- AMS      No past medical history on file.  There are no problems to display for this patient.   OB History   No obstetric history on file.     No family history on file.  Social History   Tobacco Use  . Smoking status: Not on file  Substance Use Topics  . Alcohol use: Not on file  . Drug use: Not on file    Home Medications Prior to Admission medications   Not on File    Allergies    Patient has no allergy information on record.  Review of Systems   Review of Systems  Respiratory: Positive for shortness of breath.   Neurological: Positive for weakness.  All other systems reviewed and are negative.   Physical Exam Updated Vital Signs BP 114/82   Pulse 71   Temp 98.5 F (36.9 C) (Oral)   Resp 16   Ht 5\' 3"  (1.6 m)   Wt 62.6 kg   LMP  (LMP Unknown)   SpO2 100%   BMI 24.45 kg/m   Physical Exam Vitals and nursing note reviewed.  Constitutional:      Comments: Slow to  respond, awake   HENT:     Head: Normocephalic.     Mouth/Throat:     Mouth: Mucous membranes are moist.  Eyes:     Pupils: Pupils are equal, round, and reactive to light.  Cardiovascular:     Rate and Rhythm: Regular rhythm.     Pulses: Normal pulses.  Pulmonary:     Comments: Diminished bilaterally, dry crackles bilaterally  Abdominal:     General: Abdomen is flat.     Palpations: Abdomen is soft.  Musculoskeletal:        General: Normal range of motion.     Cervical back: Normal range of motion.  Skin:    General: Skin is warm.     Capillary Refill: Capillary refill takes less than 2 seconds.  Neurological:     Comments: Strength 4/5 R arm and leg (chronic), sensation nl throughout   Psychiatric:        Mood and Affect: Mood normal.     ED Results / Procedures / Treatments   Labs (all labs ordered are listed, but only abnormal results are displayed) Labs Reviewed  CBC WITH DIFFERENTIAL/PLATELET - Abnormal; Notable for the following components:      Result Value   HCT 35.3 (*)  nRBC 0.6 (*)    All other components within normal limits  I-STAT CHEM 8, ED - Abnormal; Notable for the following components:   Sodium 131 (*)    Chloride 96 (*)    BUN 4 (*)    Calcium, Ion 1.08 (*)    All other components within normal limits  RAPID URINE DRUG SCREEN, HOSP PERFORMED  FIBRINOGEN  COMPREHENSIVE METABOLIC PANEL  ETHANOL  SALICYLATE LEVEL  ACETAMINOPHEN LEVEL  D-DIMER, QUANTITATIVE (NOT AT [facility])  FERRITIN  C-REACTIVE PROTEIN  TRIGLYCERIDES  LACTATE DEHYDROGENASE  PROCALCITONIN  TROPONIN I (HIGH SENSITIVITY)    EKG EKG Interpretation  Date/Time:  Wednesday May 19 2019 16:28:52 EST Ventricular Rate:  70 PR Interval:    QRS Duration: 87 QT Interval:  455 QTC Calculation: 491 R Axis:   52 Text Interpretation: Age not entered, assumed to be  54 years old for purpose of ECG interpretation Sinus rhythm Borderline prolonged QT interval No significant change  since last tracing Confirmed by Wandra Arthurs [phone]) on 05/19/2019 4:41:17 PM   Radiology No results found.  Procedures Procedures (including critical care time)  CRITICAL CARE Performed by: Wandra Arthurs   Total critical care time: 40 minutes  Critical care time was exclusive of separately billable procedures and treating other patients.  Critical care was necessary to treat or prevent imminent or life-threatening deterioration.  Critical care was time spent personally by me on the following activities: development of treatment plan with patient and/or surrogate as well as nursing, discussions with consultants, evaluation of patient's response to treatment, examination of patient, obtaining history from patient or surrogate, ordering and performing treatments and interventions, ordering and review of laboratory studies, ordering and review of radiographic studies, pulse oximetry and re-evaluation of patient's condition.  Angiocath insertion Performed by: Wandra Arthurs  Consent: Verbal consent obtained. Risks and benefits: risks, benefits and alternatives were discussed Time out: Immediately prior to procedure a "time out" was called to verify the correct patient, procedure, equipment, support staff and site/side marked as required.  Preparation: Patient was prepped and draped in the usual sterile fashion.  Vein Location: R antecube  Ultrasound Guided  Gauge: 20 long   Normal blood return and flush without difficulty Patient tolerance: Patient tolerated the procedure well with no immediate complications.      Medications Ordered in ED Medications  fentaNYL (SUBLIMAZE) injection 50 mcg (50 mcg Intravenous Given 05/19/19 1701)  sodium chloride 0.9 % bolus 1,000 mL (1,000 mLs Intravenous New Bag/Given 05/19/19 1701)  ondansetron (ZOFRAN) injection 4 mg (4 mg Intravenous Given 05/19/19 1701)    ED Course  I have reviewed the triage vital signs and the nursing notes.  Pertinent  labs & imaging results that were available during my care of the patient were reviewed by me and considered in my medical decision making (see chart for details).    MDM Rules/Calculators/A&P                      [name] is a 54 y.o. female here with weakness, SOB, possible cardiac arrest. Recently positive for COVID on 2/28. I am concerned that she is hypoxic from COVID vs new stroke vs polysubstance abuse. Talked to Dr. Malen Gauze from neurology. He states that patient just had stroke workup recently and had chronic R arm and leg weakness and if CT head unremarkable, will not need MRI or CTA. Will get [facility] preadmission labs. Will get tox. Patient will need admission.  7:05 PM I talked to the son, who didn't know what happened. Attempted to call daughter but she didn't pick up.  Her alcohol level is 220.  UDS and otherwise negative.  Her D-dimer is 2.8.  Of note her D-dimer yesterday was 2.7 she had a CTA yesterday in the hospital that showed no PE.  Troponins negative x1.  Her UDS is negative.  At this point I think her hypoxia and respiratory distress likely secondary to Covid and alcohol intoxication.  Patient was given Decadron and will admit for hypoxia from Covid.  RAGINE BALDER was evaluated in Emergency Department on 05/19/2019 for the symptoms described in the history of present illness. She was evaluated in the context of the global COVID-19 pandemic, which necessitated consideration that the patient might be at risk for infection with the SARS-CoV-2 virus that causes COVID-19. Institutional protocols and algorithms that pertain to the evaluation of patients at risk for COVID-19 are in a state of rapid change based on information released by regulatory bodies including the CDC and federal and state organizations. These policies and algorithms were followed during the patient's care in the ED.  Final Clinical Impression(s) / ED Diagnoses Final diagnoses:  None    Rx / DC  Orders ED Discharge Orders    None       Drenda Freeze, MD 05/19/19 [phone]

## 2019-05-19 NOTE — ED Triage Notes (Signed)
Pt here from home with cv/o resp ditress , pt may or may not have received some chest compressions by fire , upon ems arrival pt had a pulse and ventilations assisted for about 10 mns pt arrived on a nrb

## 2019-05-20 ENCOUNTER — Encounter (HOSPITAL_COMMUNITY): Payer: Self-pay | Admitting: Internal Medicine

## 2019-05-20 ENCOUNTER — Observation Stay (HOSPITAL_COMMUNITY): Payer: Medicare Other

## 2019-05-20 ENCOUNTER — Observation Stay (HOSPITAL_BASED_OUTPATIENT_CLINIC_OR_DEPARTMENT_OTHER): Payer: Medicare Other

## 2019-05-20 DIAGNOSIS — U071 COVID-19: Secondary | ICD-10-CM

## 2019-05-20 DIAGNOSIS — R7989 Other specified abnormal findings of blood chemistry: Secondary | ICD-10-CM

## 2019-05-20 DIAGNOSIS — R0602 Shortness of breath: Secondary | ICD-10-CM | POA: Diagnosis not present

## 2019-05-20 DIAGNOSIS — F10920 Alcohol use, unspecified with intoxication, uncomplicated: Secondary | ICD-10-CM

## 2019-05-20 DIAGNOSIS — Z7982 Long term (current) use of aspirin: Secondary | ICD-10-CM | POA: Diagnosis not present

## 2019-05-20 DIAGNOSIS — F329 Major depressive disorder, single episode, unspecified: Secondary | ICD-10-CM | POA: Diagnosis present

## 2019-05-20 DIAGNOSIS — R079 Chest pain, unspecified: Secondary | ICD-10-CM

## 2019-05-20 DIAGNOSIS — I10 Essential (primary) hypertension: Secondary | ICD-10-CM | POA: Diagnosis present

## 2019-05-20 DIAGNOSIS — R4182 Altered mental status, unspecified: Secondary | ICD-10-CM | POA: Diagnosis not present

## 2019-05-20 DIAGNOSIS — J690 Pneumonitis due to inhalation of food and vomit: Secondary | ICD-10-CM | POA: Diagnosis present

## 2019-05-20 DIAGNOSIS — Z801 Family history of malignant neoplasm of trachea, bronchus and lung: Secondary | ICD-10-CM | POA: Diagnosis not present

## 2019-05-20 DIAGNOSIS — Z79899 Other long term (current) drug therapy: Secondary | ICD-10-CM | POA: Diagnosis not present

## 2019-05-20 DIAGNOSIS — J9601 Acute respiratory failure with hypoxia: Secondary | ICD-10-CM | POA: Diagnosis present

## 2019-05-20 DIAGNOSIS — F10129 Alcohol abuse with intoxication, unspecified: Secondary | ICD-10-CM | POA: Diagnosis present

## 2019-05-20 DIAGNOSIS — K861 Other chronic pancreatitis: Secondary | ICD-10-CM | POA: Diagnosis present

## 2019-05-20 DIAGNOSIS — R55 Syncope and collapse: Secondary | ICD-10-CM | POA: Diagnosis present

## 2019-05-20 DIAGNOSIS — F17210 Nicotine dependence, cigarettes, uncomplicated: Secondary | ICD-10-CM | POA: Diagnosis present

## 2019-05-20 DIAGNOSIS — Y907 Blood alcohol level of 200-239 mg/100 ml: Secondary | ICD-10-CM | POA: Diagnosis present

## 2019-05-20 DIAGNOSIS — T8189XA Other complications of procedures, not elsewhere classified, initial encounter: Secondary | ICD-10-CM | POA: Diagnosis present

## 2019-05-20 DIAGNOSIS — M329 Systemic lupus erythematosus, unspecified: Secondary | ICD-10-CM | POA: Diagnosis present

## 2019-05-20 LAB — CBC WITH DIFFERENTIAL/PLATELET
Abs Immature Granulocytes: 0.01 10*3/uL (ref 0.00–0.07)
Basophils Absolute: 0 10*3/uL (ref 0.0–0.1)
Basophils Relative: 0 %
Eosinophils Absolute: 0 10*3/uL (ref 0.0–0.5)
Eosinophils Relative: 0 %
HCT: 33.8 % — ABNORMAL LOW (ref 36.0–46.0)
Hemoglobin: 11.8 g/dL — ABNORMAL LOW (ref 12.0–15.0)
Immature Granulocytes: 0 %
Lymphocytes Relative: 16 %
Lymphs Abs: 0.7 10*3/uL (ref 0.7–4.0)
MCH: 28.6 pg (ref 26.0–34.0)
MCHC: 34.9 g/dL (ref 30.0–36.0)
MCV: 81.8 fL (ref 80.0–100.0)
Monocytes Absolute: 0.1 10*3/uL (ref 0.1–1.0)
Monocytes Relative: 2 %
Neutro Abs: 3.3 10*3/uL (ref 1.7–7.7)
Neutrophils Relative %: 82 %
Platelets: 287 10*3/uL (ref 150–400)
RBC: 4.13 MIL/uL (ref 3.87–5.11)
RDW: 14.3 % (ref 11.5–15.5)
WBC: 4 10*3/uL (ref 4.0–10.5)
nRBC: 0.5 % — ABNORMAL HIGH (ref 0.0–0.2)

## 2019-05-20 LAB — HEPATIC FUNCTION PANEL
ALT: 30 U/L (ref 0–44)
AST: 36 U/L (ref 15–41)
Albumin: 3.5 g/dL (ref 3.5–5.0)
Alkaline Phosphatase: 74 U/L (ref 38–126)
Bilirubin, Direct: <0.1 mg/dL (ref 0.0–0.2)
Total Bilirubin: 0.5 mg/dL (ref 0.3–1.2)
Total Protein: 7.1 g/dL (ref 6.5–8.1)

## 2019-05-20 LAB — ECHOCARDIOGRAM COMPLETE
Height: 63 in
Weight: 2225.76 oz

## 2019-05-20 LAB — BASIC METABOLIC PANEL
Anion gap: 12 (ref 5–15)
BUN: <5 mg/dL — ABNORMAL LOW (ref 6–20)
CO2: 21 mmol/L — ABNORMAL LOW (ref 22–32)
Calcium: 9.1 mg/dL (ref 8.9–10.3)
Chloride: 107 mmol/L (ref 98–111)
Creatinine, Ser: 0.59 mg/dL (ref 0.44–1.00)
GFR calc Af Amer: >60 mL/min (ref >60)
GFR calc non Af Amer: >60 mL/min (ref >60)
Glucose, Bld: 138 mg/dL — ABNORMAL HIGH (ref 70–99)
Potassium: 4.1 mmol/L (ref 3.5–5.1)
Sodium: 140 mmol/L (ref 135–145)

## 2019-05-20 LAB — PROCALCITONIN: Procalcitonin: <0.1 ng/mL

## 2019-05-20 LAB — ACETAMINOPHEN LEVEL: Acetaminophen (Tylenol), Serum: <10 ug/mL — ABNORMAL LOW (ref 10–30)

## 2019-05-20 LAB — TSH: TSH: 0.27 u[IU]/mL — ABNORMAL LOW (ref 0.350–4.500)

## 2019-05-20 LAB — HIV ANTIBODY (ROUTINE TESTING W REFLEX): HIV Screen 4th Generation wRfx: NONREACTIVE

## 2019-05-20 LAB — LIPASE, BLOOD: Lipase: 17 U/L (ref 11–51)

## 2019-05-20 LAB — TROPONIN I (HIGH SENSITIVITY): Troponin I (High Sensitivity): <2 ng/L (ref <18)

## 2019-05-20 LAB — D-DIMER, QUANTITATIVE: D-Dimer, Quant: 2.77 ug/mL-FEU — ABNORMAL HIGH (ref 0.00–0.50)

## 2019-05-20 LAB — MAGNESIUM: Magnesium: 1.9 mg/dL (ref 1.7–2.4)

## 2019-05-20 LAB — FERRITIN: Ferritin: 69 ng/mL (ref 11–307)

## 2019-05-20 LAB — C-REACTIVE PROTEIN: CRP: 0.9 mg/dL (ref <1.0)

## 2019-05-20 MED ORDER — ACETAMINOPHEN 650 MG RE SUPP: 650.0000 mg | Freq: Four times a day (QID) | RECTAL | Status: DC | PRN

## 2019-05-20 MED ORDER — ATENOLOL 50 MG PO TABS
50.0000 mg | ORAL_TABLET | Freq: Every day | ORAL | Status: DC
  Administered 2019-05-20 – 2019-05-22 (×3): 50 mg via ORAL
  Filled 2019-05-20 (×3): qty 1

## 2019-05-20 MED ORDER — THIAMINE HCL 100 MG PO TABS
100.0000 mg | ORAL_TABLET | Freq: Every day | ORAL | Status: DC
  Filled 2019-05-20: qty 1

## 2019-05-20 MED ORDER — DICYCLOMINE HCL 10 MG PO CAPS
10.0000 mg | ORAL_CAPSULE | Freq: Three times a day (TID) | ORAL | Status: DC
  Administered 2019-05-20 – 2019-05-22 (×10): 10 mg via ORAL
  Filled 2019-05-20 (×12): qty 1

## 2019-05-20 MED ORDER — LORAZEPAM 1 MG PO TABS: 1.0000 mg | ORAL_TABLET | ORAL | Status: DC | PRN

## 2019-05-20 MED ORDER — ADULT MULTIVITAMIN W/MINERALS CH
1.0000 | ORAL_TABLET | Freq: Every day | ORAL | Status: DC
  Administered 2019-05-20 – 2019-05-22 (×3): 1 via ORAL
  Filled 2019-05-20 (×3): qty 1

## 2019-05-20 MED ORDER — FOLIC ACID 1 MG PO TABS
1.0000 mg | ORAL_TABLET | Freq: Every day | ORAL | Status: DC
  Administered 2019-05-20 – 2019-05-22 (×3): 1 mg via ORAL
  Filled 2019-05-20 (×2): qty 1

## 2019-05-20 MED ORDER — ROSUVASTATIN CALCIUM 20 MG PO TABS
20.0000 mg | ORAL_TABLET | Freq: Every day | ORAL | Status: DC
  Administered 2019-05-20 – 2019-05-21 (×2): 20 mg via ORAL
  Filled 2019-05-20 (×2): qty 1

## 2019-05-20 MED ORDER — ONDANSETRON HCL 4 MG/2ML IJ SOLN: 4.0000 mg | Freq: Four times a day (QID) | INTRAMUSCULAR | Status: DC | PRN

## 2019-05-20 MED ORDER — ASPIRIN EC 81 MG PO TBEC
81.0000 mg | DELAYED_RELEASE_TABLET | Freq: Every day | ORAL | Status: DC
  Administered 2019-05-20 – 2019-05-22 (×3): 81 mg via ORAL
  Filled 2019-05-20 (×3): qty 1

## 2019-05-20 MED ORDER — PROMETHAZINE HCL 25 MG/ML IJ SOLN
25.0000 mg | Freq: Four times a day (QID) | INTRAMUSCULAR | Status: DC | PRN
  Administered 2019-05-20 – 2019-05-22 (×6): 25 mg via INTRAVENOUS
  Filled 2019-05-20 (×7): qty 1

## 2019-05-20 MED ORDER — THIAMINE HCL 100 MG PO TABS
100.0000 mg | ORAL_TABLET | Freq: Every day | ORAL | Status: DC
  Administered 2019-05-20 – 2019-05-22 (×3): 100 mg via ORAL
  Filled 2019-05-20 (×2): qty 1

## 2019-05-20 MED ORDER — MAGNESIUM SULFATE 2 GM/50ML IV SOLN
2.0000 g | Freq: Once | INTRAVENOUS | Status: AC
  Administered 2019-05-20: 10:00:00 2 g via INTRAVENOUS
  Filled 2019-05-20: qty 50

## 2019-05-20 MED ORDER — SODIUM CHLORIDE 0.9 % IV SOLN
3.0000 g | Freq: Four times a day (QID) | INTRAVENOUS | Status: DC
  Administered 2019-05-20 – 2019-05-22 (×8): 3 g via INTRAVENOUS
  Filled 2019-05-20 (×2): qty 8
  Filled 2019-05-20 (×2): qty 3
  Filled 2019-05-20: qty 8
  Filled 2019-05-20 (×2): qty 3
  Filled 2019-05-20 (×2): qty 8
  Filled 2019-05-20 (×3): qty 3

## 2019-05-20 MED ORDER — ZOLPIDEM TARTRATE 5 MG PO TABS
5.0000 mg | ORAL_TABLET | Freq: Once | ORAL | Status: AC
  Administered 2019-05-20: 20:00:00 5 mg via ORAL
  Filled 2019-05-20: qty 1

## 2019-05-20 MED ORDER — ESCITALOPRAM OXALATE 20 MG PO TABS
20.0000 mg | ORAL_TABLET | Freq: Every day | ORAL | Status: DC
  Administered 2019-05-20 – 2019-05-22 (×3): 20 mg via ORAL
  Filled 2019-05-20 (×3): qty 1

## 2019-05-20 MED ORDER — ACETAMINOPHEN 325 MG PO TABS
650.0000 mg | ORAL_TABLET | Freq: Four times a day (QID) | ORAL | Status: DC | PRN
  Administered 2019-05-20: 07:00:00 650 mg via ORAL
  Filled 2019-05-20: qty 2

## 2019-05-20 MED ORDER — VITAMIN B-12 100 MCG PO TABS
100.0000 ug | ORAL_TABLET | Freq: Every day | ORAL | Status: DC
  Administered 2019-05-20 – 2019-05-22 (×3): 100 ug via ORAL
  Filled 2019-05-20 (×3): qty 1

## 2019-05-20 MED ORDER — ENOXAPARIN SODIUM 40 MG/0.4ML ~~LOC~~ SOLN
40.0000 mg | SUBCUTANEOUS | Status: DC
  Administered 2019-05-20 – 2019-05-21 (×2): 40 mg via SUBCUTANEOUS
  Filled 2019-05-20 (×3): qty 0.4

## 2019-05-20 MED ORDER — AMLODIPINE BESYLATE 10 MG PO TABS
10.0000 mg | ORAL_TABLET | Freq: Every day | ORAL | Status: DC
  Administered 2019-05-20 – 2019-05-22 (×3): 10 mg via ORAL
  Filled 2019-05-20 (×3): qty 1

## 2019-05-20 MED ORDER — LORAZEPAM 2 MG/ML IJ SOLN
1.0000 mg | INTRAMUSCULAR | Status: DC | PRN
  Administered 2019-05-21: 1 mg via INTRAVENOUS
  Filled 2019-05-20: qty 1

## 2019-05-20 MED ORDER — FOLIC ACID 1 MG PO TABS
1.0000 mg | ORAL_TABLET | Freq: Every day | ORAL | Status: DC
  Filled 2019-05-20: qty 1

## 2019-05-20 MED ORDER — THIAMINE HCL 100 MG/ML IJ SOLN: 100.0000 mg | Freq: Every day | INTRAMUSCULAR | Status: DC

## 2019-05-20 MED ORDER — PANTOPRAZOLE SODIUM 40 MG PO TBEC
40.0000 mg | DELAYED_RELEASE_TABLET | Freq: Every day | ORAL | Status: DC
  Administered 2019-05-20 – 2019-05-22 (×3): 40 mg via ORAL
  Filled 2019-05-20 (×3): qty 1

## 2019-05-20 MED ORDER — MUPIROCIN 2 % EX OINT
TOPICAL_OINTMENT | Freq: Every day | CUTANEOUS | Status: DC
  Administered 2019-05-21: 1 via TOPICAL
  Filled 2019-05-20: qty 22

## 2019-05-20 MED ORDER — ONDANSETRON HCL 4 MG PO TABS: 4.0000 mg | ORAL_TABLET | Freq: Four times a day (QID) | ORAL | Status: DC | PRN

## 2019-05-20 MED ORDER — LORAZEPAM 2 MG/ML IJ SOLN
0.0000 mg | Freq: Four times a day (QID) | INTRAMUSCULAR | Status: AC
  Administered 2019-05-20 – 2019-05-22 (×5): 1 mg via INTRAVENOUS
  Filled 2019-05-20 (×6): qty 1

## 2019-05-20 MED ORDER — LORAZEPAM 2 MG/ML IJ SOLN
0.0000 mg | Freq: Two times a day (BID) | INTRAMUSCULAR | Status: DC
  Administered 2019-05-22: 1 mg via INTRAVENOUS
  Filled 2019-05-20: qty 1

## 2019-05-20 NOTE — Consult Note (Signed)
[facility] Nurse Consult Note: Reason for Consult:Chronic nonhealing midline abdominal wounds.   Wound type:Chronic nonhealing surgical wounds.  Pressure Injury POA: NA Measurement:3 openings:  Proximal:  0.3 cm opening  Umbilical: 0.3 cm opening Distal:  0.5 cm opening Wound bed:not visible   Drainage (amount, consistency, odor) purulent tan  Musty odor.  Periwound:scarring from surgical incision Dressing procedure/placement/frequency: Cleanse midline abdominal wounds with soap and water. Apply mupirocin ointment to open wounds.  Cover with silicone foam.  Reapply ointment daily and change foam every three days.  Will not follow at this time.  Please re-consult if needed.  Domenic Moras MSN, RN, FNP-BC CWON Wound, Ostomy, Continence Nurse Pager [phone]

## 2019-05-20 NOTE — Progress Notes (Addendum)
PROGRESS NOTE                                                                                                                                                                                                             Patient Demographics:    Angel Price, is a 54 y.o. female, DOB - Jul 06, 1965, MG:[id number]  Outpatient Primary MD for the patient is Angel Fire, MD   Admit date - 05/19/2019   LOS - 0  No chief complaint on file.      Brief Narrative: Patient is a 54 y.o. 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  She was discharged in a stable condition on 3/2-apparently on 3/3-while walking up 1 flight of stairs (12 steps) patient started to complain of shortness of breath and lost consciousness.  Per significant other (spoke to over the [phone]) patient did not have a pulse-when BLS/Price trucks came-they started CPR for approximately 10 minutes, when EMS arrived patient was awake/alert and had a pulse.  She was brought to the ED on 100% NRB mask-but was rapidly titrated down to 4 L.  UDS was negative, however alcohol level was 225.  She was then admitted to [facility] for further evaluation and treatment.  Significant Events: 2/28-3/2>> admit and discharge after evaluation of right-sided weakness (psychogenic) 2/21>> MRI brain-no infarct 2/28>> CTA head/neck-no LVO 3/2>> CT angio chest-no PE 3/3>> readmit to [facility] for syncope/respiratory failure-per documentation CPR performed by BLS 3/3>> alcohol level of 225, UDS negative 2/21>> presented to the ED with right-sided weakness-neuro consulted-no further work-up  required  Microbiology data: 3/1>> blood culture: No growth  Procedures/studies: 2/28>> CTA head/neck-no LVO  Consults: 3/4>> cardiology consult Einar Gip) 2/28>> neuro consult-psychogenic etiology of right sided weakness 3/1>> psych consult 2/21>> neuro consult-psychogenic rt weakness-no further work-up required   Subjective:    Angel Price today is completely awake and alert.  She was noted moving her right upper extremity and right lower extremity when distracted (and later when asked).  She denies any shortness of breath-has some chest soreness from CPR done at home.  She is on room air  and without any complaints.   Assessment  & Plan :   Syncope-?  Cardiac arrest: Etiology not apparent-although CPR was apparently performed in the outpatient setting-does not appear to have actually had a cardiac arrest.  She had an alcohol level of 225-may have just syncopized due to alcohol intoxication.  Telemetry unremarkable overnight.  No outpatient EKG/telemetry strips available in paper chart.  Recent CTA chest on 3/2 negative for PE-obtaining EEG (significant other did not notice any seizure like activity), echo and lower extremity Doppler.  But since patient apparently had CPR for approximately 10 minutes done by BLS/Price men-and history is not very clear-we will go ahead and consult cardiology (d/w Dr Einar Gip).  Acute Hypoxic Resp Failure: Brought to the ED on 100% NRB-then titrated to 4 L of oxygen via nasal cannula-currently on room air.  Chest x-ray this morning has a subtle right-sided infiltrate which I suspect is more of aspiration pneumonitis rather than Covid pneumonia.  Starting Unasyn-awaiting inflammatory markers/procalcitonin.  COVID-19 infection: See above regarding concern for aspiration pneumonia-she is on room air this morning.  Awaiting inflammatory markers-markers were relatively benign yesterday.  Plans are to watch and follow clinical trajectory/markers.  Hold off on initiating  remdesivir/steroids for now.  Fever: afebrile  O2 requirements:  SpO2: 96 %   COVID-19 Labs: Recent Labs    05/19/19 1638 05/19/19 1700  DDIMER  --  2.87*  FERRITIN  --  88  LDH 143  --   CRP  --  0.9    No results found for: BNP  Recent Labs  Lab 05/19/19 1638  PROCALCITON <0.10    No results found for: SARSCOV2NAA   COVID-19 Medications: None  Prone/Incentive Spirometry: encouraged incentive spirometry use 3-4/hour.  DVT Prophylaxis  :  Lovenox   Elevated D-dimer: Not hypoxic-recent CTA chest negative for PE.  Given syncope-we will go ahead and get a lower extremity Doppler/echo.  Follow.  EtOH abuse: Last drink yesterday-presented with a alcohol level of 225.  Already tremulous this morning.  Claims she drinks 3 beer cans (25 ounces) of beer every other day.  Start Ativan per CIWA protocol.  Follow.  HTN: Stable with atenolol  Chronically draining abdominal wound: Obtain wound care consult.  Depression: Continue Lexapro  Very mild right-sided weakness: Has had neurology evaluation x2 in the past few weeks-numerous imaging studies unrevealing.  When distracted most both right upper and lower extremities very spontaneously-but when asked to squeeze my hands-she has some mild weakness.  Supportive care for now.  ?  History of hypothyroidism: Obtain TSH-ordered 3/4  History of cocaine abuse: Claims she is clean for 1 year-recent UDS negative.   ABG:    Component Value Date/Time   TCO2 23 05/19/2019 1653    Vent Settings: N/A  Condition - Stable  Family Communication  : Significant other over the phone (patient gave verbal consent for me to get in touch with him)  Code Status :  Full Code  Diet :  Diet Order            Diet Heart Room service appropriate? Yes; Fluid consistency: Thin  Diet effective now               Disposition Plan  :  Remain hospitalized-Home in the next few days  Barriers to discharge: IV antibiotics for presumed  aspiration pneumonia-work-up in progress for syncope/?cardiac arrest.  Antimicorbials  :    Anti-infectives (From admission, onward)   None      Inpatient  Medications  Scheduled Meds: . amLODipine  10 mg Oral Daily  . aspirin EC  81 mg Oral Daily  . atenolol  50 mg Oral Daily  . dicyclomine  10 mg Oral TID AC & HS  . enoxaparin (LOVENOX) injection  40 mg Subcutaneous Q24H  . escitalopram  20 mg Oral Daily  . folic acid  1 mg Oral Daily  . folic acid  1 mg Oral Daily  . LORazepam  0-4 mg Intravenous Q6H   Followed by  . [START ON 05/22/2019] LORazepam  0-4 mg Intravenous Q12H  . multivitamin with minerals  1 tablet Oral Daily  . pantoprazole  40 mg Oral Daily  . thiamine  100 mg Oral Daily   Or  . thiamine  100 mg Intravenous Daily  . thiamine  100 mg Oral Daily  . vitamin B-12  100 mcg Oral Daily   Continuous Infusions: . magnesium sulfate bolus IVPB 2 g (05/20/19 0946)   PRN Meds:.acetaminophen **OR** acetaminophen, LORazepam **OR** LORazepam, promethazine   Time Spent in minutes 35    See all Orders from today for further details   Oren Binet M.D on 05/20/2019 at 10:07 AM  To page go to [email or web address] - use universal password  [facility] -  Office  [phone]    Objective:   Vitals:   05/19/19 1945 05/19/19 1946 05/19/19 2210 05/20/19 0600  BP:  (!) 121/97 121/87 124/85  Pulse: 65 66 90   Resp: 16 20 18    Temp:   98.2 F (36.8 C) 98.5 F (36.9 C)  TempSrc:   Oral Oral  SpO2: 100% 100% 96%   Weight:   63.1 kg   Height:   5\' 3"  (1.6 m)     Wt Readings from Last 3 Encounters:  05/19/19 63.1 kg    No intake or output data in the 24 hours ending 05/20/19 1007   Physical Exam Gen Exam:Alert awake-not in any distress HEENT:atraumatic, normocephalic Chest: B/L clear to auscultation anteriorly CVS:S1S2 regular Abdomen:soft non tender, non distended Extremities:no edema Neurology: Non focal Skin: no rash   Data Review:     CBC Recent Labs  Lab 05/19/19 1638 05/19/19 1653 05/20/19 0149  WBC 6.5  --  4.0  HGB 12.1 12.6 11.8*  HCT 35.3* 37.0 33.8*  PLT 280  --  287  MCV 83.1  --  81.8  MCH 28.5  --  28.6  MCHC 34.3  --  34.9  RDW 14.4  --  14.3  LYMPHSABS 2.5  --  0.7  MONOABS 0.5  --  0.1  EOSABS 0.1  --  0.0  BASOSABS 0.0  --  0.0    Chemistries  Recent Labs  Lab 05/19/19 1638 05/19/19 1653 05/20/19 0149  NA 128* 131* 140  K 4.5 4.4 4.1  CL 95* 96* 107  CO2 21*  --  21*  GLUCOSE 81 83 138*  BUN 5* 4* <5*  CREATININE 0.54 0.70 0.59  CALCIUM 9.0  --  9.1  MG  --   --  1.9  AST 51*  --  36  ALT 30  --  30  ALKPHOS 77  --  74  BILITOT 0.7  --  0.5   ------------------------------------------------------------------------------------------------------------------ Recent Labs    05/19/19 1638  TRIG 270*    No results found for: HGBA1C ------------------------------------------------------------------------------------------------------------------ No results for input(s): TSH, T4TOTAL, T3FREE, THYROIDAB in the last 72 hours.  Invalid input(s): FREET3 ------------------------------------------------------------------------------------------------------------------ Recent Labs    05/19/19 1700  FERRITIN 88    Coagulation profile No results for input(s): INR, PROTIME in the last 168 hours.  Recent Labs    05/19/19 1700  DDIMER 2.87*    Cardiac Enzymes No results for input(s): CKMB, TROPONINI, MYOGLOBIN in the last 168 hours.  Invalid input(s): CK ------------------------------------------------------------------------------------------------------------------ No results found for: BNP  Micro Results No results found for this or any previous visit (from the past 240 hour(s)).  Radiology Reports CT Head Wo Contrast  Result Date: 05/19/2019 CLINICAL DATA:  Possible acute stroke. EXAM: CT HEAD WITHOUT CONTRAST TECHNIQUE: Contiguous axial images were obtained from  the base of the skull through the vertex without intravenous contrast. COMPARISON:  May 16, 2019 CTA brain and MR brain examinations. FINDINGS: Brain: No evidence of acute infarction, hemorrhage, hydrocephalus, extra-axial collection or mass lesion/mass effect. Empty sella turcica. Benign pineal calcification. Normal brain parenchymal volume. No vascular territory distribution encephalomalacia. Vascular: No hyperdense vessel or unexpected calcification. Volume averaging of the sella turcica and basilar artery in the axial sequence without hyperdensity of this vessel in the sagittal or coronal views. Skull: Normal calvarium. Sinuses/Orbits: Normal orbital soft tissues.Mild mucoperiosteal disease of the bilateral ethmoidal and left sphenoidal sinuses. Secretions in the left ethmoid sphenoid and bilateral maxillary sinuses. Other: Normal aeration of the bilateral mastoid air cells. IMPRESSION: No acute intracranial hemorrhage or edema. MR brain without contrast can provide a more sensitive exclusion of an acute ischemia if clinically appropriate. Mild paranasal sinus mucoperiosteal disease and secretions. Empty sella turcica. Electronically Signed   By: Revonda Humphrey   On: 05/19/2019 18:10   DG Chest Port 1 View  Result Date: 05/19/2019 CLINICAL DATA:  Shortness of breath, respiratory distress EXAM: PORTABLE CHEST 1 VIEW COMPARISON:  Portable exam 1758 hours compared to 11/11/2018 FINDINGS: Upper normal heart size. Normal mediastinal contours and pulmonary vascularity. Lungs clear. No infiltrate, pleural effusion, or pneumothorax. Osseous structures unremarkable. IMPRESSION: No acute abnormalities. Electronically Signed   By: Lavonia Dana M.D.   On: 05/19/2019 18:12   DG Chest Port 1V same Day  Result Date: 05/20/2019 CLINICAL DATA:  Is shortness of breath COVID positive EXAM: PORTABLE CHEST 1 VIEW COMPARISON:  05/19/2019 FINDINGS: Subtle airspace process along the minor fissure in the right chest is similar.  No signs of dense consolidation or evidence of pleural effusion. Also with subtle opacity at the right lung base. Cardiomediastinal contours are stable. Hilar structures are unremarkable. Visualized skeletal structures are normal. Postoperative changes are again noted in the upper abdomen to the right of the spine. IMPRESSION: Stable subtle airspace process along the minor fissure in the right chest and right lung base. Electronically Signed   By: Zetta Bills M.D.   On: 05/20/2019 07:56

## 2019-05-20 NOTE — Consult Note (Addendum)
CARDIOLOGY CONSULT NOTE  Patient ID: Angel Price MRN: [id number] DOB/AGE: [birth year] 54 y.o.  Admit date: 05/19/2019 Referring Physician: [facility] hospitalist Reason for Consultation:  Loss of consciosuness  HPI:   54 y.o. 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   Patient was in the ED on 05/16/2019 with right-sided weakness.  Stroke work-up was essentially unremarkable at that time.  Neurologist Dr. Cheral Marker saw the patient, who felt that her presentation was most consistent with psychogenic etiology.  During hospitalization, her D-dimer was elevated at 2.78.  This led to CTA chest evaluation which did not show PE.  It did show severe calcification in left main and LAD artery, not quantifiable given that study was not a coronary CTA.  Her ethanol level was elevated that admission.  It appears that her Covid positive status was not known at the time of this hospitalization.  Subsequently, presented again to the ED on 05/19/2019, this time with respiratory distress and possibility of a cardiac arrest.  It appears that patient was with her family and daughter, and felt some shortness of breath.  She appears to have had loss of consciousness at some point.  Fire department arrived after family sought help, reportedly did not find a pulse and initiated CPR and bag ventilation for about 10 minutes.  Subsequently, EMS arrived, found the pulse, and found her to be awake and alert.  Patient reported laying admitted to having had alcohol that morning following which she had 4 episodes of vomiting.  Curiously, patient recollects "someone pressing on her chest" during her CPR.   Ethanol level again elevated, even higher than her previous hospitalization earlier this week.  Chest x-ray showed subtle airspace process along the minor fissure of right  chest, similar to prior chest x-ray.  She also has subtle opacity of right lung base, without any dense consolidation or pleural effusion.  Between 05/18/2019 7:02 AM and 05/20/2019 1:49 AM, she has had 6 high-sensitivity troponin values, all unequivocally negative.  Echocardiogram today is unremarkable, shows normal EF and wall motion.  Lower extremity duplex ultrasound is negative for DVT.  CT head on 05/19/2019 showed no acute abnormality.  This comes after essentially unremarkable CTA head/neck that showed minimal atherosclerosis but no large vessel occlusion between 05/16/2019 and 05/18/2019.  CRP and procalcitonin is negative.  D-dimer remains mildly elevated at 2.77.   Past Medical History:  Diagnosis Date  . Hypertension      Past Surgical History:  Procedure Laterality Date  . ABDOMINAL HYSTERECTOMY    . bilroth 2       Family History  Problem Relation Age of Onset  . Lung cancer Father      Social History: Social History   Socioeconomic History  . Marital status: Divorced    Spouse name: Not on file  . Number of children: Not on file  . Years of education: Not on file  . Highest education level: Not on file  Occupational History  . Not on file  Tobacco Use  . Smoking status: Current Every Day Smoker  . Smokeless tobacco: Never Used  Substance and Sexual Activity  . Alcohol use: Yes  . Drug use: Not on file  . Sexual activity: Not on file  Other Topics Concern  . Not on file  Social History Narrative  . Not on file   Social Determinants of Health  Financial Resource Strain:   . Difficulty of Paying Living Expenses: Not on file  Food Insecurity:   . Worried About Charity fundraiser in the Last Year: Not on file  . Ran Out of Food in the Last Year: Not on file  Transportation Needs:   . Lack of Transportation (Medical): Not on file  . Lack of Transportation (Non-Medical): Not on file  Physical Activity:   . Days of Exercise per Week: Not on file  . Minutes of  Exercise per Session: Not on file  Stress:   . Feeling of Stress : Not on file  Social Connections:   . Frequency of Communication with Friends and Family: Not on file  . Frequency of Social Gatherings with Friends and Family: Not on file  . Attends Religious Services: Not on file  . Active Member of Clubs or Organizations: Not on file  . Attends Archivist Meetings: Not on file  . Marital Status: Not on file  Intimate Partner Violence:   . Fear of Current or Ex-Partner: Not on file  . Emotionally Abused: Not on file  . Physically Abused: Not on file  . Sexually Abused: Not on file     Medications Prior to Admission  Medication Sig Dispense Refill Last Dose  . amLODipine (NORVASC) 10 MG tablet Take 10 mg by mouth daily.   Past Week at Unknown time  . aspirin EC 81 MG tablet Take 81 mg by mouth daily.   Past Week at Unknown time  . atenolol (TENORMIN) 50 MG tablet Take 50 mg by mouth daily.   Past Week at Unknown time  . Cyanocobalamin (VITAMIN B12 PO) Take 1 tablet by mouth daily.   Past Week at Unknown time  . dicyclomine (BENTYL) 10 MG capsule Take 10 mg by mouth 4 (four) times daily -  before meals and at bedtime.   Past Week at Unknown time  . escitalopram (LEXAPRO) 20 MG tablet Take 20 mg by mouth daily.   Past Week at Unknown time  . folic acid (FOLVITE) 1 MG tablet Take 1 mg by mouth daily.   Past Week at Unknown time  . ondansetron (ZOFRAN) 4 MG tablet Take 4 mg by mouth every 8 (eight) hours as needed for nausea or vomiting.    Past Week at Unknown time  . pantoprazole (PROTONIX) 40 MG tablet Take 40 mg by mouth daily.   Past Week at Unknown time  . promethazine (PHENERGAN) 25 MG tablet Take 25 mg by mouth every 8 (eight) hours as needed for nausea or vomiting.    unknown  . PROMETHEGAN 25 MG suppository Place 25 mg rectally every 8 (eight) hours as needed for nausea or vomiting.    unknown  . thiamine 100 MG tablet Take 100 mg by mouth daily.   Past Week at Unknown  time    Review of Systems  Constitution: Negative for decreased appetite, malaise/fatigue, weight gain and weight loss.  HENT: Negative for congestion.   Eyes: Negative for visual disturbance.  Cardiovascular: Positive for chest pain (Intermittent retrosternal sharp chest pain, lasting for 30 min at rest, happening since September 2020). Negative for dyspnea on exertion, leg swelling, palpitations and syncope.  Respiratory: Negative for cough.   Endocrine: Negative for cold intolerance.  Hematologic/Lymphatic: Does not bruise/bleed easily.  Skin: Negative for itching and rash.  Musculoskeletal: Negative for myalgias.  Gastrointestinal: Negative for abdominal pain, nausea and vomiting.  Genitourinary: Negative for dysuria.  Neurological: Negative for  dizziness and weakness.  Psychiatric/Behavioral: The patient is not nervous/anxious.   All other systems reviewed and are negative.     Physical Exam: Physical Exam  Constitutional: She is oriented to person, place, and time. She appears well-developed and well-nourished. No distress.  HENT:  Head: Normocephalic and atraumatic.  Eyes: Pupils are equal, round, and reactive to light. Conjunctivae are normal.  Neck: No JVD present.  Cardiovascular: Normal rate, regular rhythm and intact distal pulses.  Pulmonary/Chest: Effort normal. No respiratory distress.     Abdominal:  Detailed exam deferred.  Musculoskeletal:        General: No edema.  Neurological: She is alert and oriented to person, place, and time. No cranial nerve deficit.  Skin: Skin is warm and dry.  Psychiatric: She has a normal mood and affect.  Nursing note and vitals reviewed.    Labs:   Lab Results  Component Value Date   WBC 4.0 05/20/2019   HGB 11.8 (L) 05/20/2019   HCT 33.8 (L) 05/20/2019   MCV 81.8 05/20/2019   PLT 287 05/20/2019    Recent Labs  Lab 05/20/19 0149  NA 140  K 4.1  CL 107  CO2 21*  BUN <5*  CREATININE 0.59  CALCIUM 9.1  PROT  7.1  BILITOT 0.5  ALKPHOS 74  ALT 30  AST 36  GLUCOSE 138*    Lipid Panel     Component Value Date/Time   TRIG 270 (H) 05/19/2019 1638    BNP (last 3 results) No results for input(s): BNP in the last 8760 hours.   Recent Labs    05/20/19 0957  TSH 0.270*   Results for JONNA, PAVY (MRN [id number]) as of 05/20/2019 15:29  Ref. Range 05/19/2019 16:38 05/19/2019 18:38 05/20/2019 01:49  Troponin I (High Sensitivity) Latest Ref Range: <18 ng/L 2 4 <2     Radiology: CT Head Wo Contrast  Result Date: 05/19/2019 CLINICAL DATA:  Possible acute stroke. EXAM: CT HEAD WITHOUT CONTRAST TECHNIQUE: Contiguous axial images were obtained from the base of the skull through the vertex without intravenous contrast. COMPARISON:  May 16, 2019 CTA brain and MR brain examinations. FINDINGS: Brain: No evidence of acute infarction, hemorrhage, hydrocephalus, extra-axial collection or mass lesion/mass effect. Empty sella turcica. Benign pineal calcification. Normal brain parenchymal volume. No vascular territory distribution encephalomalacia. Vascular: No hyperdense vessel or unexpected calcification. Volume averaging of the sella turcica and basilar artery in the axial sequence without hyperdensity of this vessel in the sagittal or coronal views. Skull: Normal calvarium. Sinuses/Orbits: Normal orbital soft tissues.Mild mucoperiosteal disease of the bilateral ethmoidal and left sphenoidal sinuses. Secretions in the left ethmoid sphenoid and bilateral maxillary sinuses. Other: Normal aeration of the bilateral mastoid air cells. IMPRESSION: No acute intracranial hemorrhage or edema. MR brain without contrast can provide a more sensitive exclusion of an acute ischemia if clinically appropriate. Mild paranasal sinus mucoperiosteal disease and secretions. Empty sella turcica. Electronically Signed   By: Revonda Humphrey   On: 05/19/2019 18:10   DG Chest Port 1 View  Result Date: 05/19/2019 CLINICAL DATA:  Shortness  of breath, respiratory distress EXAM: PORTABLE CHEST 1 VIEW COMPARISON:  Portable exam 1758 hours compared to 11/11/2018 FINDINGS: Upper normal heart size. Normal mediastinal contours and pulmonary vascularity. Lungs clear. No infiltrate, pleural effusion, or pneumothorax. Osseous structures unremarkable. IMPRESSION: No acute abnormalities. Electronically Signed   By: Lavonia Dana M.D.   On: 05/19/2019 18:12   DG Chest Port 1V same Day  Result Date: 05/20/2019  CLINICAL DATA:  Is shortness of breath COVID positive EXAM: PORTABLE CHEST 1 VIEW COMPARISON:  05/19/2019 FINDINGS: Subtle airspace process along the minor fissure in the right chest is similar. No signs of dense consolidation or evidence of pleural effusion. Also with subtle opacity at the right lung base. Cardiomediastinal contours are stable. Hilar structures are unremarkable. Visualized skeletal structures are normal. Postoperative changes are again noted in the upper abdomen to the right of the spine. IMPRESSION: Stable subtle airspace process along the minor fissure in the right chest and right lung base. Electronically Signed   By: Zetta Bills M.D.   On: 05/20/2019 07:56   ECHOCARDIOGRAM COMPLETE  Result Date: 05/20/2019    ECHOCARDIOGRAM REPORT   Patient Name:   Angel Price Date of Exam: 05/20/2019 Medical Rec #:  [id number]        Height:       63.0 in Accession #:    [id number]       Weight:       139.1 lb Date of Birth:  05-23-1965        BSA:          1.657 m Patient Age:    20 years         BP:           124/85 mmHg Patient Gender: F                HR:           63 bpm. Exam Location:  Inpatient Procedure: 2D Echo Indications:    Chest Pain 786.50 / R07.9  History:        Patient has no prior history of Echocardiogram examinations.                 Risk Factors:Hypertension. Covid 19 virus.  Sonographer:    Vikki Ports Turrentine Referring Phys: [place]  1. Left ventricular ejection fraction, by estimation, is 65  to 70%. The left ventricle has hyperdynamic function. The left ventricle has no regional wall motion abnormalities. Left ventricular diastolic parameters are consistent with Grade I diastolic dysfunction (impaired relaxation).  2. Right ventricular systolic function is normal. The right ventricular size is normal.  3. The mitral valve is normal in structure and function. No evidence of mitral valve regurgitation. No evidence of mitral stenosis.  4. The aortic valve is normal in structure and function. Aortic valve regurgitation is not visualized. No aortic stenosis is present.  5. The inferior vena cava is normal in size with greater than 50% respiratory variability, suggesting right atrial pressure of 3 mmHg. FINDINGS  Left Ventricle: Left ventricular ejection fraction, by estimation, is 65 to 70%. The left ventricle has hyperdynamic function. The left ventricle has no regional wall motion abnormalities. The left ventricular internal cavity size was normal in size. There is no left ventricular hypertrophy. Left ventricular diastolic parameters are consistent with Grade I diastolic dysfunction (impaired relaxation). Indeterminate filling pressures. Right Ventricle: The right ventricular size is normal. No increase in right ventricular wall thickness. Right ventricular systolic function is normal. Left Atrium: Left atrial size was normal in size. Right Atrium: Right atrial size was normal in size. Pericardium: There is no evidence of pericardial effusion. Mitral Valve: The mitral valve is normal in structure and function. Normal mobility of the mitral valve leaflets. No evidence of mitral valve regurgitation. No evidence of mitral valve stenosis. Tricuspid Valve: The tricuspid valve is normal in structure. Tricuspid valve regurgitation  is not demonstrated. No evidence of tricuspid stenosis. Aortic Valve: The aortic valve is normal in structure and function. Aortic valve regurgitation is not visualized. No aortic  stenosis is present. Pulmonic Valve: The pulmonic valve was normal in structure. Pulmonic valve regurgitation is not visualized. No evidence of pulmonic stenosis. Aorta: The aortic root is normal in size and structure. Venous: The inferior vena cava is normal in size with greater than 50% respiratory variability, suggesting right atrial pressure of 3 mmHg. IAS/Shunts: No atrial level shunt detected by color flow Doppler.  LEFT VENTRICLE PLAX 2D LVIDd:         4.46 cm  Diastology LVIDs:         2.93 cm  LV e' lateral:   6.20 cm/s LV PW:         1.07 cm  LV E/e' lateral: 11.3 LV IVS:        1.06 cm  LV e' medial:    6.31 cm/s LVOT diam:     2.00 cm  LV E/e' medial:  11.1 LV SV:         96 LV SV Index:   58 LVOT Area:     3.14 cm  RIGHT VENTRICLE RV S prime:     14.90 cm/s TAPSE (M-mode): 2.3 cm LEFT ATRIUM             Index       RIGHT ATRIUM           Index LA diam:        3.50 cm 2.11 cm/m  RA Area:     12.00 cm LA Vol (A2C):   46.7 ml 28.18 ml/m RA Volume:   23.20 ml  14.00 ml/m LA Vol (A4C):   31.4 ml 18.95 ml/m LA Biplane Vol: 39.2 ml 23.65 ml/m  AORTIC VALVE LVOT Vmax:   141.00 cm/s LVOT Vmean:  94.400 cm/s LVOT VTI:    0.304 m  AORTA Ao Root diam: 3.20 cm MITRAL VALVE MV Area (PHT): 2.66 cm    SHUNTS MV Decel Time: 285 msec    Systemic VTI:  0.30 m MV E velocity: 69.80 cm/s  Systemic Diam: 2.00 cm MV A velocity: 85.70 cm/s MV E/A ratio:  0.81 Mihai Croitoru MD Electronically signed by Sanda Klein MD Signature Date/Time: 05/20/2019/11:19:42 AM    Final    VAS [place] LOWER EXTREMITY VENOUS (DVT)  Result Date: 05/20/2019  Lower Venous DVTStudy Indications: Elevated Ddimer.  Risk Factors: COVID 19 positive. Comparison Study: No prior studies. Performing Technologist: Oliver Hum RVT  Examination Guidelines: A complete evaluation includes B-mode imaging, spectral Doppler, color Doppler, and power Doppler as needed of all accessible portions of each vessel. Bilateral testing is considered an integral part  of a complete examination. Limited examinations for reoccurring indications may be performed as noted. The reflux portion of the exam is performed with the patient in reverse Trendelenburg.  +---------+---------------+---------+-----------+----------+--------------+ RIGHT    CompressibilityPhasicitySpontaneityPropertiesThrombus Aging +---------+---------------+---------+-----------+----------+--------------+ CFV      Full           Yes      Yes                                 +---------+---------------+---------+-----------+----------+--------------+ SFJ      Full                                                        +---------+---------------+---------+-----------+----------+--------------+  FV Prox  Full                                                        +---------+---------------+---------+-----------+----------+--------------+ FV Mid   Full                                                        +---------+---------------+---------+-----------+----------+--------------+ FV DistalFull                                                        +---------+---------------+---------+-----------+----------+--------------+ PFV      Full                                                        +---------+---------------+---------+-----------+----------+--------------+ POP      Full           Yes      Yes                                 +---------+---------------+---------+-----------+----------+--------------+ PTV      Full                                                        +---------+---------------+---------+-----------+----------+--------------+ PERO     Full                                                        +---------+---------------+---------+-----------+----------+--------------+   +---------+---------------+---------+-----------+----------+--------------+ LEFT     CompressibilityPhasicitySpontaneityPropertiesThrombus Aging  +---------+---------------+---------+-----------+----------+--------------+ CFV      Full           Yes      Yes                                 +---------+---------------+---------+-----------+----------+--------------+ SFJ      Full                                                        +---------+---------------+---------+-----------+----------+--------------+ FV Prox  Full                                                        +---------+---------------+---------+-----------+----------+--------------+  FV Mid   Full                                                        +---------+---------------+---------+-----------+----------+--------------+ FV DistalFull                                                        +---------+---------------+---------+-----------+----------+--------------+ PFV      Full                                                        +---------+---------------+---------+-----------+----------+--------------+ POP      Full           Yes      Yes                                 +---------+---------------+---------+-----------+----------+--------------+ PTV      Full                                                        +---------+---------------+---------+-----------+----------+--------------+ PERO     Full                                                        +---------+---------------+---------+-----------+----------+--------------+     Summary: RIGHT: - There is no evidence of deep vein thrombosis in the lower extremity.  - No cystic structure found in the popliteal fossa.  LEFT: - There is no evidence of deep vein thrombosis in the lower extremity.  - No cystic structure found in the popliteal fossa.  *See table(s) above for measurements and observations.    Preliminary     Scheduled Meds: . amLODipine  10 mg Oral Daily  . aspirin EC  81 mg Oral Daily  . atenolol  50 mg Oral Daily  . dicyclomine  10 mg Oral TID AC & HS   . enoxaparin (LOVENOX) injection  40 mg Subcutaneous Q24H  . escitalopram  20 mg Oral Daily  . folic acid  1 mg Oral Daily  . LORazepam  0-4 mg Intravenous Q6H   Followed by  . [START ON 05/22/2019] LORazepam  0-4 mg Intravenous Q12H  . multivitamin with minerals  1 tablet Oral Daily  . mupirocin ointment   Topical Daily  . pantoprazole  40 mg Oral Daily  . thiamine  100 mg Oral Daily   Or  . thiamine  100 mg Intravenous Daily  . vitamin B-12  100 mcg Oral Daily   Continuous Infusions: . ampicillin-sulbactam (UNASYN) IV 3 g (05/20/19 1437)   PRN Meds:.acetaminophen **OR** acetaminophen, LORazepam **OR** LORazepam, promethazine  CARDIAC  STUDIES:  EKG 05/20/2019: Sinus rhythm. Borderline prolonged QTc interval. Otherwise normal EKG.  Echocardiogram 05/20/2019: 1. Left ventricular ejection fraction, by estimation, is 65 to 70%. The  left ventricle has hyperdynamic function. The left ventricle has no  regional wall motion abnormalities. Left ventricular diastolic parameters  are consistent with Grade I diastolic  dysfunction (impaired relaxation).  2. Right ventricular systolic function is normal. The right ventricular  size is normal.  3. The mitral valve is normal in structure and function. No evidence of  mitral valve regurgitation. No evidence of mitral stenosis.  4. The aortic valve is normal in structure and function. Aortic valve  regurgitation is not visualized. No aortic stenosis is present.  5. The inferior vena cava is normal in size with greater than 50%  respiratory variability, suggesting right atrial pressure of 3 mmHg.   CTA chest 05/18/2019: 1. Negative for pulmonary embolus. 2. Lower lung volumes from a prior CT with bilateral atelectasis. Additional mild nonspecific pulmonary ground-glass opacity such that COVID-19 pneumonia is difficult to exclude. 3. Calcified coronary artery and Aortic atherosclerosis (ICD10-I70.0).  COVID test  05/16/19: POSITIVEAbnormal     Assessment & Recommendations:  54 y.o. 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  History and timeline is highly suspicious for a true cardiac arrest.  It is highly unlikely that patient return to baseline mental status immediately after 10 minutes of CPR.  She has clear recollection of "someone pressing on her chest" during the CPR, which goes against pulseless status. She has no evidence of myocardial ischemia on EKG, multiple high-sensitivity troponin levels, and echocardiogram.  EKG shows borderline QT prolongation-which is baseline for patient, without any arrhythmia.  I do not think patient had either acute coronary syndrome or acute arrhythmia, based on history and her work-up.  Her episode was most likely a combination of COVID +ve status, dehydration, alcohol intoxication, probable vasovagal syncope leading to hypotension and perhaps thready pulse.  Her chest pain episodes are unlikely to be anginal in nature. However, c Recommend Aspirin 81 mg, high intensity statin-crestor 20 mg daily. Coincidentally, patient has significant calcification in her left main and LAD coronary artery.  This is likely a chronic finding that does need further work-up in the future, but does not explain patient's acute presentation. Overall, I do not recommend any further inpatient cardiac work-up at this time.  When patient recovers from Covid, she should undergo further evaluation, either with coronary CT angiogram or Lexiscan nuclear stress test.  Time spent: 60 min  Nigel Mormon, MD 05/20/2019, 3:42 PM [facility] Pager: [phone] Office: [phone] If no answer Cell [phone]

## 2019-05-20 NOTE — Progress Notes (Signed)
Pharmacy Antibiotic Note  Angel Price is a 54 y.o. female admitted on 05/19/2019 with confusion and shortness of breath.  Pt presented to ED following EtOH use and reported 4 episodes of vomiting.  Of note, pt tested + for COVID 2/28.    Pharmacy has been consulted for Unasyn dosing for possible aspiration pna.  Plan: Unasyn 3gm IV q6h  Height: 5\' 3"  (160 cm) Weight: 139 lb 1.8 oz (63.1 kg) IBW/kg (Calculated) : 52.4  Temp (24hrs), Avg:98.4 F (36.9 C), Min:98.2 F (36.8 C), Max:98.5 F (36.9 C)  Recent Labs  Lab 05/19/19 1638 05/19/19 1653 05/20/19 0149  WBC 6.5  --  4.0  CREATININE 0.54 0.70 0.59    Estimated Creatinine Clearance: 74.5 mL/min (by C-G formula based on SCr of 0.59 mg/dL).    Allergies  Allergen Reactions  . Codeine Hives  . Morphine And Related Hives  . Reglan [Metoclopramide] Other (See Comments)    EPS symptoms     Antimicrobials this admission: Unasyn 3/4 >>    Dose adjustments this admission:  Microbiology results:  Thank you for allowing pharmacy to be a part of this patient's care.  [employer], Pharm.D., BCPS Clinical Pharmacist Clinical phone for 05/20/2019 from 8:30-4:00 is [phone].  **Pharmacist phone directory can be found on [email or web address] listed under [place].  05/20/2019 10:33 AM

## 2019-05-20 NOTE — Progress Notes (Signed)
  Echocardiogram 2D Echocardiogram has been performed.  Angel Price A Angel Price 05/20/2019, 9:13 AM

## 2019-05-20 NOTE — H&P (Addendum)
History and Physical    Angel Price [id number] DOB: 02-22-1966 DOA: 05/19/2019  PCP: Rosita Fire, MD  Patient coming from: Home.  Chief Complaint: Shortness of breath and confusion.  History is obtained from ER physician patient.  Unable to reach family with the number provided.  HPI: Angel Price is a 54 y.o. 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  Per the report 40 pound initially came in for the patient not have any pulse and had to resuscitate her about 10 minutes and when EMS arrived patient had pulses and was alert and awake.  Was brought to the ER.  Patient admits to drinking alcohol in the morning following which patient had 4 episodes of vomiting.  Had epigastric discomfort which patient states is chronic.  ED Course: In the ER patient was found to have mild right-sided weakness which is as per the presentation last time and CT head is unremarkable neurology recommended no further work-up of the right-sided weakness.  EKG shows normal sinus rhythm.  Labs show sodium 128  inflammatory markers for Covid were negative except elevated D-dimer.  Patient initially required 4 L oxygen chest x-ray was unremarkable.  CBC unremarkable.  Acetaminophen level was 11.  Patient denied overdosing with Tylenol.  AST was 51.  Since patient required 4 L oxygen and with recent Covid infection was given 1 dose of Decadron.  By the time I examined patient is alert awake and maintaining sats without any oxygen.  Review of Systems: As per HPI, rest all  negative.   Past Medical History:  Diagnosis Date  . Hypertension     Past Surgical History:  Procedure Laterality Date  . ABDOMINAL HYSTERECTOMY    . bilroth 2       reports that she has been smoking. She has never used smokeless tobacco. She reports current alcohol use. No history on file for drug.  Allergies  Allergen Reactions  . Codeine Hives  . Morphine And Related Hives  . Reglan [Metoclopramide] Other (See Comments)    EPS symptoms     Family History  Problem Relation Age of Onset  . Lung cancer Father     Prior to Admission medications   Medication Sig Start Date End Date Taking? Authorizing Provider  amLODipine (NORVASC) 10 MG tablet Take 10 mg by mouth daily. 04/29/19  Yes [provider]  aspirin EC 81 MG tablet Take 81 mg by mouth daily.   Yes [provider]  atenolol (TENORMIN) 50 MG tablet Take 50 mg by mouth daily. 04/29/19  Yes [provider]  Cyanocobalamin (VITAMIN B12 PO) Take 1 tablet by mouth daily.   Yes [provider]  dicyclomine (BENTYL) 10 MG capsule Take 10 mg by mouth 4 (four) times daily -  before meals and at bedtime.   Yes [provider]  escitalopram (LEXAPRO) 20 MG tablet Take 20 mg by mouth daily. 04/29/19  Yes [provider]  folic acid (FOLVITE) 1 MG tablet Take 1 mg by mouth daily.   Yes [provider]  ondansetron (ZOFRAN) 4 MG tablet  Take 4 mg by mouth every 8 (eight) hours as needed for nausea or vomiting.  05/03/19  Yes [provider]  pantoprazole (PROTONIX) 40 MG tablet Take 40 mg by mouth daily. 04/29/19  Yes [provider]  promethazine (PHENERGAN) 25 MG tablet Take 25 mg by mouth every 8 (eight) hours as needed for nausea or vomiting.  03/22/19  Yes [provider]  PROMETHEGAN 25 MG suppository Place 25 mg rectally every 8 (eight) hours as needed for nausea or vomiting.  04/06/19  Yes [provider]  thiamine 100 MG tablet Take  100 mg by mouth daily.   Yes [provider]    Physical Exam: Constitutional: Moderately built and nourished. Vitals:   05/19/19 1800 05/19/19 1945 05/19/19 1946 05/19/19 2210  BP: 123/89  (!) 121/97 121/87  Pulse:  65 66 90  Resp: 13 16 20 18   Temp:    98.2 F (36.8 C)  TempSrc:    Oral  SpO2:  100% 100% 96%  Weight:    63.1 kg  Height:    5\' 3"  (1.6 m)   Eyes: Anicteric no pallor. ENMT: No discharge from the ears and nose or mouth. Neck: No neck rigidity. Respiratory: No rhonchi or crepitations. Cardiovascular: S1-S2 heard. Abdomen: Soft nontender bowel sounds present.  Chronic wound dressing is seen. Musculoskeletal: No edema. Skin: Chronic skin wounds on the epigastric area. Neurologic: Alert awake oriented to time place and person.  Moves all extremities. Psychiatric: Appears normal per normal affect.   Labs on Admission: I have personally reviewed following labs and imaging studies  CBC: Recent Labs  Lab 05/19/19 1638 05/19/19 1653  WBC 6.5  --   NEUTROABS 3.4  --   HGB 12.1 12.6  HCT 35.3* 37.0  MCV 83.1  --   PLT 280  --    Basic Metabolic Panel: Recent Labs  Lab 05/19/19 1638 05/19/19 1653  NA 128* 131*  K 4.5 4.4  CL 95* 96*  CO2 21*  --   GLUCOSE 81 83  BUN 5* 4*  CREATININE 0.54 0.70  CALCIUM 9.0  --    GFR: Estimated Creatinine Clearance: 74.5 mL/min (by C-G formula based on SCr of 0.7 mg/dL). Liver Function Tests: Recent Labs  Lab 05/19/19 1638  AST 51*  ALT 30  ALKPHOS 77  BILITOT 0.7  PROT 7.4  ALBUMIN 3.6   No results for input(s): LIPASE, AMYLASE in the last 168 hours. No results for input(s): AMMONIA in the last 168 hours. Coagulation Profile: No results for input(s): INR, PROTIME in the last 168 hours. Cardiac Enzymes: No results for input(s): CKTOTAL, CKMB, CKMBINDEX, TROPONINI in the last 168 hours. BNP (last 3 results) No results for input(s): PROBNP in the last 8760 hours. HbA1C: No results for input(s):  HGBA1C in the last 72 hours. CBG: No results for input(s): GLUCAP in the last 168 hours. Lipid Profile: Recent Labs    05/19/19 1638  TRIG 270*   Thyroid Function Tests: No results for input(s): TSH, T4TOTAL, FREET4, T3FREE, THYROIDAB in the last 72 hours. Anemia Panel: Recent Labs    05/19/19 1700  FERRITIN 88   Urine analysis: No results found for: COLORURINE, APPEARANCEUR, LABSPEC, PHURINE, GLUCOSEU, HGBUR, BILIRUBINUR, KETONESUR, PROTEINUR, UROBILINOGEN, NITRITE, LEUKOCYTESUR Sepsis Labs: @LABRCNTIP (procalcitonin:4,lacticidven:4) )No results found for this or any previous visit (from the past 240 hour(s)).   Radiological Exams on Admission: CT Head Wo Contrast  Result Date: 05/19/2019 CLINICAL DATA:  Possible acute stroke. EXAM: CT HEAD WITHOUT  CONTRAST TECHNIQUE: Contiguous axial images were obtained from the base of the skull through the vertex without intravenous contrast. COMPARISON:  May 16, 2019 CTA brain and MR brain examinations. FINDINGS: Brain: No evidence of acute infarction, hemorrhage, hydrocephalus, extra-axial collection or mass lesion/mass effect. Empty sella turcica. Benign pineal calcification. Normal brain parenchymal volume. No vascular territory distribution encephalomalacia. Vascular: No hyperdense vessel or unexpected calcification. Volume averaging of the sella turcica and basilar artery in the axial sequence without hyperdensity of this vessel in the sagittal or coronal views. Skull: Normal calvarium. Sinuses/Orbits: Normal orbital soft tissues.Mild mucoperiosteal disease of the bilateral ethmoidal and left sphenoidal sinuses. Secretions in the left ethmoid sphenoid and bilateral maxillary sinuses. Other: Normal aeration of the bilateral mastoid air cells. IMPRESSION: No acute intracranial hemorrhage or edema. MR brain without contrast can provide a more sensitive exclusion of an acute ischemia if clinically appropriate. Mild paranasal sinus mucoperiosteal  disease and secretions. Empty sella turcica. Electronically Signed   By: Revonda Humphrey   On: 05/19/2019 18:10   DG Chest Port 1 View  Result Date: 05/19/2019 CLINICAL DATA:  Shortness of breath, respiratory distress EXAM: PORTABLE CHEST 1 VIEW COMPARISON:  Portable exam 1758 hours compared to 11/11/2018 FINDINGS: Upper normal heart size. Normal mediastinal contours and pulmonary vascularity. Lungs clear. No infiltrate, pleural effusion, or pneumothorax. Osseous structures unremarkable. IMPRESSION: No acute abnormalities. Electronically Signed   By: Lavonia Dana M.D.   On: 05/19/2019 18:12    EKG: Independently reviewed.  Normal sinus rhythm.  Assessment/Plan Principal Problem:   Acute respiratory failure with hypoxia (HCC) Active Problems:   Essential hypertension   COVID-19    1. Acute respiratory failure with hypoxia and transient confusion because of nocturia patient did receive some CPR.  We will cycle cardiac markers follow metabolic panel closely.  Patient is presently not hypoxic.  Had a CT angiogram of the chest 2 days ago which was negative.  Still has some chest pain likely from compression.  Will check 2D echo and EEG. 2. COVID-19 infection presently not hypoxic flu markers are negative.  Will observe.  3. Hypertension on amlodipine and Tenormin. 4. Depression on Lexapro. 5. Right-sided weakness for which patient had extensive work-up during last admission per neuro no further work-up necessary. 6. Alcohol abuse advised about quitting.  Closely monitor for any withdrawal. 7. 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  Tylenol level was around 11.  We will recheck Tylenol level along with LFTs.  But patient denies any overdose or suicidal ideation.   DVT prophylaxis: Lovenox. Code Status: Full. Family Communication: Unable to reach family. Disposition Plan: Home. Consults called: ER physician discussed with neurologist. Admission status: Observation.   Rise Patience MD [facility] Pager [phone].  If 7PM-7AM, please contact night-coverage [email or web address] Password [email or web address]  05/20/2019, 12:53 AM

## 2019-05-20 NOTE — Progress Notes (Signed)
Bilateral lower extremity venous duplex has been completed. Preliminary results can be found in CV Proc through chart review.   05/20/19 1:58 PM Carlos Levering RVT

## 2019-05-21 ENCOUNTER — Inpatient Hospital Stay (HOSPITAL_COMMUNITY): Payer: Medicare Other

## 2019-05-21 DIAGNOSIS — R55 Syncope and collapse: Secondary | ICD-10-CM

## 2019-05-21 DIAGNOSIS — R4182 Altered mental status, unspecified: Secondary | ICD-10-CM

## 2019-05-21 LAB — CBC
HCT: 32.9 % — ABNORMAL LOW (ref 36.0–46.0)
Hemoglobin: 11.3 g/dL — ABNORMAL LOW (ref 12.0–15.0)
MCH: 29 pg (ref 26.0–34.0)
MCHC: 34.3 g/dL (ref 30.0–36.0)
MCV: 84.4 fL (ref 80.0–100.0)
Platelets: 256 10*3/uL (ref 150–400)
RBC: 3.9 MIL/uL (ref 3.87–5.11)
RDW: 14.6 % (ref 11.5–15.5)
WBC: 8.9 10*3/uL (ref 4.0–10.5)
nRBC: 0.3 % — ABNORMAL HIGH (ref 0.0–0.2)

## 2019-05-21 LAB — C-REACTIVE PROTEIN: CRP: 0.6 mg/dL (ref <1.0)

## 2019-05-21 LAB — COMPREHENSIVE METABOLIC PANEL
ALT: 41 U/L (ref 0–44)
AST: 63 U/L — ABNORMAL HIGH (ref 15–41)
Albumin: 3.3 g/dL — ABNORMAL LOW (ref 3.5–5.0)
Alkaline Phosphatase: 75 U/L (ref 38–126)
Anion gap: 10 (ref 5–15)
BUN: 7 mg/dL (ref 6–20)
CO2: 24 mmol/L (ref 22–32)
Calcium: 8.8 mg/dL — ABNORMAL LOW (ref 8.9–10.3)
Chloride: 106 mmol/L (ref 98–111)
Creatinine, Ser: 0.58 mg/dL (ref 0.44–1.00)
GFR calc Af Amer: >60 mL/min (ref >60)
GFR calc non Af Amer: >60 mL/min (ref >60)
Glucose, Bld: 109 mg/dL — ABNORMAL HIGH (ref 70–99)
Potassium: 3.8 mmol/L (ref 3.5–5.1)
Sodium: 140 mmol/L (ref 135–145)
Total Bilirubin: 0.5 mg/dL (ref 0.3–1.2)
Total Protein: 6.8 g/dL (ref 6.5–8.1)

## 2019-05-21 LAB — T4, FREE: Free T4: 0.54 ng/dL — ABNORMAL LOW (ref 0.61–1.12)

## 2019-05-21 LAB — D-DIMER, QUANTITATIVE: D-Dimer, Quant: 4.92 ug/mL-FEU — ABNORMAL HIGH (ref 0.00–0.50)

## 2019-05-21 LAB — FERRITIN: Ferritin: 55 ng/mL (ref 11–307)

## 2019-05-21 MED ORDER — OXYCODONE-ACETAMINOPHEN 5-325 MG PO TABS
1.0000 | ORAL_TABLET | Freq: Three times a day (TID) | ORAL | Status: DC | PRN
  Administered 2019-05-21: 1 via ORAL
  Administered 2019-05-21: 12:00:00 2 via ORAL
  Filled 2019-05-21: qty 1
  Filled 2019-05-21: qty 2

## 2019-05-21 MED ORDER — SODIUM CHLORIDE 0.9 % IV SOLN: INTRAVENOUS | Status: AC

## 2019-05-21 MED ORDER — DIPHENHYDRAMINE HCL 50 MG/ML IJ SOLN
12.5000 mg | Freq: Three times a day (TID) | INTRAMUSCULAR | Status: DC | PRN
  Administered 2019-05-21 – 2019-05-22 (×2): 12.5 mg via INTRAVENOUS
  Filled 2019-05-21 (×2): qty 1

## 2019-05-21 NOTE — Procedures (Signed)
Patient Name: ISABELE FAM  MRN: [id number]  Epilepsy Attending: Lora Havens  Referring Physician/Provider: Dr. Gean Birchwood Date: 05/21/2019 Duration: 23.52 minutes  Patient history: 54 year old female who presented with shortness of breath and confusion, diagnosed with COVID-19 infection.  EEG to evaluate for seizures.    Level of alertness: Awake, sleep  AEDs during EEG study: Ativan  Technical aspects: This EEG study was done with scalp electrodes positioned according to the 10-20 International system of electrode placement. Electrical activity was acquired at a sampling rate of 500Hz  and reviewed with a high frequency filter of 70Hz  and a low frequency filter of 1Hz . EEG data were recorded continuously and digitally stored.   Description: The posterior dominant rhythm consists of 8 Hz activity of moderate voltage (25-35 uV) seen predominantly in posterior head regions, symmetric and reactive to eye opening and eye closing. There is an excessive amount of 15 to 18 Hz, 2-3 uV beta activity with irregular morphology distributed symmetrically and diffusely.  Sleep was characterized by vertex waves, sleep spindles (13 to 15 Hz), maximal frontocentral physiologic photic driving was seen during photic stimulation.  Hyperventilation was not performed.  Abnormality - Excessive beta, generalized  IMPRESSION: This study is within normal limits. The excessive beta activity seen in the background is most likely due to the effect of benzodiazepine and is a benign EEG pattern. No seizures or definite epileptiform discharges were seen throughout the recording.  Oneika Simonian Barbra Sarks

## 2019-05-21 NOTE — Progress Notes (Signed)
Pharmacy Antibiotic Note  Angel Price is a 54 y.o. female admitted on 05/19/2019 with COVID + and r/o aspiration.  Pharmacy has been consulted for Unasynb dosing.  ID: r/o aspiration in the setting of emesis x 4, EtOH.  - Afebrile. WBC 8.9, Scr 0.58, PC and CRP WNL, no cultures  Unasyn 3/4>>  Plan: Unasyn 3gm IV q6h. Dose ok for renal function Pharmacy will sign off. Please reconsult for further dosing assitance.     Height: 5\' 3"  (160 cm) Weight: 139 lb 1.8 oz (63.1 kg) IBW/kg (Calculated) : 52.4  Temp (24hrs), Avg:97.8 F (36.6 C), Min:97.5 F (36.4 C), Max:97.9 F (36.6 C)  Recent Labs  Lab 05/19/19 1638 05/19/19 1653 05/20/19 0149 05/21/19 0813  WBC 6.5  --  4.0 8.9  CREATININE 0.54 0.70 0.59 0.58    Estimated Creatinine Clearance: 74.5 mL/min (by C-G formula based on SCr of 0.58 mg/dL).    Allergies  Allergen Reactions  . Codeine Hives  . Morphine And Related Hives  . Reglan [Metoclopramide] Other (See Comments)    EPS symptoms    Jiovanny Burdell S. Alford Highland, PharmD, BCPS Clinical Staff Pharmacist [email or web address]  Wayland Salinas 05/21/2019 1:10 PM

## 2019-05-21 NOTE — Progress Notes (Signed)
Portable EEG completed, results pending. 

## 2019-05-21 NOTE — Evaluation (Addendum)
Physical Therapy Evaluation Patient Details Name: Angel Price MRN: [id number] DOB: [birth year] Today's Date: 05/21/2019   History of Present Illness  54 year old female with recent admission, just discharged 05/18/19, re-admitted 05/19/19 due to unresponsiveness during stair negotiation at home. She had sudden onset of increasing confusion shortness of breath while negotiating up her stairs. EMS was called.  When firefighters arrived patient not have a pulse and CPR initiated for 10 minutes. When EMS arrived, patient had pulses and was alert and awake.  She was brought to the ER on nonrebreather --> 4L [place] --> RA now. Her alcohol level was 225.  Patient admits to drinking alcohol in the morning and had  4 episodes of vomiting after that. She also has chronic epigastric discomfort. Cardiology consulted and note that her chest pain episodes are unlike to be anginal in naure. They do not recommend any further cardiac work-up at this time.     Clinical Impression  Patient limited by low BP and drop in BP with standing and marching in place. Patient denies dizziness or lightheadedness. This PT knows patient from recent admission. Anticipate once patient's medical workup complete and BP normalizes, that patient will progress her mobility back to ambulatory with RW which is her baseline. Home PT recommended at time of discharge 05/18/19. Patient was not home long enough for initiation of services.   supine BP: 92/79 sitting BP: 100/74, HR 69 bpm standing BP: 87/69, pt noted not dizzy standing after marching in place BP: 77/61 --> patient assisted back to bed: HR 64, O2 96% on RA. BP: 90/65    Follow Up Recommendations Home health PT    Equipment Recommendations  (pt owns RW)       Precautions / Restrictions Precautions Precautions: Fall;Other (comment) Precaution Comments: monitor BP      Mobility  Bed Mobility Overal bed mobility: Modified Independent             General bed mobility  comments: HOB elevated use of bedrail  Transfers Overall transfer level: Needs assistance Equipment used: Rolling walker (2 wheeled) Transfers: Sit to/from Stand Sit to Stand: Min guard;Supervision(close supervision/contact guard to ensure safety)         General transfer comment: sit<>stand from EOB, patient denies dizziness, BP dropped in standing and continued to decrease with continued standing and marching in place  Ambulation/Gait    General Gait Details: Further mobiltiy deferred at this time due to pt's low BP and drop in BP with mobility        Balance Overall balance assessment: Needs assistance Sitting-balance support: Feet supported Sitting balance-Leahy Scale: Good     Standing balance support: Bilateral upper extremity supported   Standing balance comment: supervision standing balance with RW, patient denies lightheadedness or dizziness        Pertinent Vitals/Pain Pain Assessment: 0-10 Pain Score: 8  Pain Location: abdomen Pain Descriptors / Indicators: Sharp Pain Intervention(s): Limited activity within patient's tolerance;Monitored during session(RN informed via secure chat)    Home Living Family/patient expects to be discharged to:: Private residence Living Arrangements: Spouse/significant other((dtr also?))   Type of Home: Other(Comment)(Condo) Home Access: Stairs to enter Entrance Stairs-Rails: Left Entrance Stairs-Number of Steps: 2 Home Layout: Two level;1/2 bath on main level Home Equipment: Walker - 2 wheels      Prior Function Level of Independence: Independent with assistive device(s)         Comments: modI with RW prior to last admission  Extremity/Trunk Assessment        Lower Extremity Assessment Lower Extremity Assessment: RLE deficits/detail RLE Deficits / Details: R ankle strength 3+/5, R knee 4-/5, R hip flexion 3+/5 RLE Sensation: (sensation intact to light touch)       Communication   Communication:  No difficulties  Cognition Arousal/Alertness: Awake/alert Behavior During Therapy: WFL for tasks assessed/performed Overall Cognitive Status: Within Functional Limits for tasks assessed       General Comments General comments (skin integrity, edema, etc.): At rest in bed at start of session BP: 92/79, sitting EOB BP: 100/74, HR 69 bpm, Standing BP: 87/69 (patient denied dizziness), continued standing after marching in place approx 5 times each LE: 77/61 --> assisted patient back to bed and BP: 90/65, HR 64 bpm, O2 96% on room air        Assessment/Plan    PT Assessment Patient needs continued PT services  PT Problem List Decreased strength;Decreased activity tolerance;Decreased balance       PT Treatment Interventions DME instruction;Gait training;Stair training;Functional mobility training;Therapeutic activities;Therapeutic exercise;Balance training;Patient/family education    PT Goals (Current goals can be found in the Care Plan section)  Acute Rehab PT Goals Patient Stated Goal: none stated PT Goal Formulation: With patient Time For Goal Achievement: 06/03/19 Potential to Achieve Goals: Good    Frequency Min 3X/week    AM-PAC PT "6 Clicks" Mobility  Outcome Measure Help needed turning from your back to your side while in a flat bed without using bedrails?: None Help needed moving from lying on your back to sitting on the side of a flat bed without using bedrails?: None Help needed moving to and from a bed to a chair (including a wheelchair)?: A Little Help needed standing up from a chair using your arms (e.g., wheelchair or bedside chair)?: A Little Help needed to walk in hospital room?: A Little Help needed climbing 3-5 steps with a railing? : A Little 6 Click Score: 20    End of Session   Activity Tolerance: Other (comment);Treatment limited secondary to medical complications (Comment)(limited due to low BP and drop in BP with activity) Patient left: in bed;with call  bell/phone within reach;with bed alarm set;with nursing/sitter in room Nurse Communication: Mobility status;Other (comment)(BP response) PT Visit Diagnosis: Unsteadiness on feet (R26.81);Other abnormalities of gait and mobility (R26.89)    Time: 1141-1200 PT Time Calculation (min) (ACUTE ONLY): 19 min   Charges:   PT Evaluation $PT Eval Moderate Complexity: 1 Mod          Birdie Hopes, DPT, PT Acute Rehab [phone] office    Birdie Hopes 05/21/2019, 2:50 PM

## 2019-05-21 NOTE — Progress Notes (Addendum)
PROGRESS NOTE                                                                                                                                                                                                             Patient Demographics:    Angel Price, is a 54 y.o. female, DOB - Oct 30, 1965, [id number]  Outpatient Primary MD for the patient is Rosita Fire, MD   Admit date - 05/19/2019   LOS - 1  No chief complaint on file.      Brief Narrative: Patient is a 54 y.o. 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  She was discharged in a stable condition on 3/2-apparently on 3/3-while walking up 1 flight of stairs (12 steps) patient started to complain of shortness of breath and lost consciousness.  Per significant other (spoke to over the [phone]) patient did not have a pulse-when BLS/fire trucks came-they started CPR for approximately 10 minutes, when EMS arrived patient was awake/alert and had a pulse.  She was brought to the ED on 100% NRB mask-but was rapidly titrated down to 4 L.  UDS was negative, however alcohol level was 225.  She [facility] then [facility] for further evaluation and treatment.  Significant Events: 2/28-3/2>> admit and discharge after evaluation of right-sided weakness (psychogenic) 2/21>> MRI brain-no infarct 2/28>> CTA head/neck-no LVO 3/2>> CT angio chest-no PE 3/3>> readmit to [facility] for syncope/respiratory failure-per documentation CPR performed by BLS 3/3>> alcohol level of 225, UDS negative 2/21>> presented to the ED with right-sided weakness-neuro consulted-no further work-up  required  Microbiology data: 3/1>> blood culture: No growth  Procedures/studies: 3/5>>EEG-no seizure 3/4>>TTE-EF 65-70% 2/28>> CTA head/neck-no LVO  Consults: 3/4>> cardiology consult Einar Gip) 2/28>> neuro consult-psychogenic etiology of right sided weakness 3/1>> psych consult 2/21>> neuro consult-psychogenic rt weakness-no further work-up required   Subjective:   Remains stable-on room air.  Continues to have some tremors.  Continues to move right side spontaneously when distracted.   Assessment  & Plan :   Syncope-?  Cardiac arrest: Appreciate cardiology eval-not thought to have had a cardiac arrest-likely syncopized-etiology not clear  but this is in the setting of alcohol intoxication (alcohol level of 225).  Telemetry unremarkable overnight.  Recent CTA chest on 3/2 neg for PE, lower extremity Doppler negative for DVT.  EEG without seizures.  Echo with preserved EF.  Doubt any further work-up required at this point.  Acute Hypoxic Resp Failure secondary to aspiration pneumonitis: Brought to the ED on 100% NRB-then titrated to 4 L of oxygen via nasal cannula-remained stable on room air for the past 2 days.  Suspect may have aspirated during syncope-CPR in the setting of alcohol intoxication-hence on IV Unasyn.  She remained stable-I suspect she could be transition to oral antibiotics in the next day or so  COVID-19 infection: See above regarding concern for aspiration pneumonia-she is on room air this morning.  I do not think she has any active COVID-19 infection-she is mostly asymptomatic.  Her initial hypoxemia could have been from syncope and resultant aspiration pneumonia rather than COVID-19.  Her inflammatory markers are normal.  No indication to start steroids/remdesivir for now.    Fever: afebrile  O2 requirements:  SpO2: 94 %   COVID-19 Labs: Recent Labs    05/19/19 1638 05/19/19 1700 05/20/19 0957 05/21/19 0813  DDIMER  --  2.87* 2.77* 4.92*  FERRITIN  --  88 69  55  LDH 143  --   --   --   CRP  --  0.9 0.9 0.6    No results found for: BNP  Recent Labs  Lab 05/19/19 1638 05/20/19 1028  PROCALCITON <0.10 <0.10    No results found for: SARSCOV2NAA   COVID-19 Medications: None  Prone/Incentive Spirometry: encouraged incentive spirometry use 3-4/hour.  DVT Prophylaxis  :  Lovenox   Elevated D-dimer: Not hypoxic-recent CTA chest negative for PE.  Lower extremity Doppler negative for DVT.  Doubt further work-up required.   EtOH abuse: Last drink on 3/3-presented with a alcohol level of 225.  Tremors have improved-she is alert awake and alert-she has been counseled-she claims to drink 3 beer cans (25 ounces) of beer every other day.  Continue Ativan per CIWA protocol.  Follow.  HTN: Stable with atenolol  Chronically draining abdominal wound/chronic abdominal pain/chronic pancreatitis: Appreciate wound care eval-continue Percocet.  Depression: Continue Lexapro  Very mild right-sided weakness: Has had neurology evaluation x2 in the past few weeks-numerous imaging studies unrevealing.  When distracted most both right upper and lower extremities very spontaneously-but when asked to squeeze my hands-she has some mild weakness.  Supportive care for now.  ?  History of hypothyroidism: TSH and free T4 suppressed-repeat in the outpatient setting-?  Sick euthyroid syndrome  History of cocaine abuse: Claims she is clean for 1 year-recent UDS negative.   ABG:    Component Value Date/Time   TCO2 23 05/19/2019 1653    Vent Settings: N/A  Condition - Stable  Family Communication  : Patient to update family herself.  Code Status :  Full Code  Diet :  Diet Order            Diet Heart Room service appropriate? Yes; Fluid consistency: Thin  Diet effective now               Disposition Plan  :  Remain hospitalized-Home over the weekend if clinical improvement continues.   Barriers to discharge: IV antibiotics for presumed aspiration  pneumonia  Antimicorbials  :    Anti-infectives (From admission, onward)   Start     Dose/Rate Route Frequency Ordered Stop  05/20/19 1200  Ampicillin-Sulbactam (UNASYN) 3 g in sodium chloride 0.9 % 100 mL IVPB     3 g 200 mL/hr over 30 Minutes Intravenous Every 6 hours 05/20/19 1029        Inpatient Medications  Scheduled Meds: . amLODipine  10 mg Oral Daily  . aspirin EC  81 mg Oral Daily  . atenolol  50 mg Oral Daily  . dicyclomine  10 mg Oral TID AC & HS  . enoxaparin (LOVENOX) injection  40 mg Subcutaneous Q24H  . escitalopram  20 mg Oral Daily  . folic acid  1 mg Oral Daily  . LORazepam  0-4 mg Intravenous Q6H   Followed by  . [START ON 05/22/2019] LORazepam  0-4 mg Intravenous Q12H  . multivitamin with minerals  1 tablet Oral Daily  . mupirocin ointment   Topical Daily  . pantoprazole  40 mg Oral Daily  . rosuvastatin  20 mg Oral q1800  . thiamine  100 mg Oral Daily   Or  . thiamine  100 mg Intravenous Daily  . vitamin B-12  100 mcg Oral Daily   Continuous Infusions: . ampicillin-sulbactam (UNASYN) IV 3 g (05/21/19 1210)   PRN Meds:.acetaminophen **OR** acetaminophen, LORazepam **OR** LORazepam, oxyCODONE-acetaminophen, promethazine   Time Spent in minutes 35    See all Orders from today for further details   Oren Binet M.D on 05/21/2019 at 2:39 PM  To page go to [email or web address] - use universal password  [facility] -  Office  [phone]    Objective:   Vitals:   05/21/19 0100 05/21/19 0530 05/21/19 0800 05/21/19 1415  BP:  119/85 (!) 148/84 92/63  Pulse: 80 62 73 72  Resp:  17 19 18   Temp:  97.9 F (36.6 C) (!) 97.5 F (36.4 C)   TempSrc:  Oral Oral   SpO2:  97% 95% 94%  Weight:      Height:        Wt Readings from Last 3 Encounters:  05/19/19 63.1 kg     Intake/Output Summary (Last 24 hours) at 05/21/2019 1439 Last data filed at 05/21/2019 0910 Gross per 24 hour  Intake 300 ml  Output 2100 ml  Net -1800 ml     Physical  Exam Gen Exam:Alert awake-not in any distress HEENT:atraumatic, normocephalic Chest: B/L clear to auscultation anteriorly CVS:S1S2 regular Abdomen:soft non tender, non distended Extremities:no edema Neurology: Non focal Skin: no rash   Data Review:    CBC Recent Labs  Lab 05/19/19 1638 05/19/19 1653 05/20/19 0149 05/21/19 0813  WBC 6.5  --  4.0 8.9  HGB 12.1 12.6 11.8* 11.3*  HCT 35.3* 37.0 33.8* 32.9*  PLT 280  --  287 256  MCV 83.1  --  81.8 84.4  MCH 28.5  --  28.6 29.0  MCHC 34.3  --  34.9 34.3  RDW 14.4  --  14.3 14.6  LYMPHSABS 2.5  --  0.7  --   MONOABS 0.5  --  0.1  --   EOSABS 0.1  --  0.0  --   BASOSABS 0.0  --  0.0  --     Chemistries  Recent Labs  Lab 05/19/19 1638 05/19/19 1653 05/20/19 0149 05/21/19 0813  NA 128* 131* 140 140  K 4.5 4.4 4.1 3.8  CL 95* 96* 107 106  CO2 21*  --  21* 24  GLUCOSE 81 83 138* 109*  BUN 5* 4* <5* 7  CREATININE 0.54 0.70 0.59 0.58  CALCIUM 9.0  --  9.1 8.8*  MG  --   --  1.9  --   AST 51*  --  36 63*  ALT 30  --  30 41  ALKPHOS 77  --  74 75  BILITOT 0.7  --  0.5 0.5   ------------------------------------------------------------------------------------------------------------------ Recent Labs    05/19/19 1638  TRIG 270*    No results found for: HGBA1C ------------------------------------------------------------------------------------------------------------------ Recent Labs    05/20/19 0957  TSH 0.270*   ------------------------------------------------------------------------------------------------------------------ Recent Labs    05/20/19 0957 05/21/19 0813  FERRITIN 69 55    Coagulation profile No results for input(s): INR, PROTIME in the last 168 hours.  Recent Labs    05/20/19 0957 05/21/19 0813  DDIMER 2.77* 4.92*    Cardiac Enzymes No results for input(s): CKMB, TROPONINI, MYOGLOBIN in the last 168 hours.  Invalid input(s):  CK ------------------------------------------------------------------------------------------------------------------ No results found for: BNP  Micro Results No results found for this or any previous visit (from the past 240 hour(s)).  Radiology Reports EEG  Result Date: 05/21/2019 Lora Havens, MD     05/21/2019  2:19 PM Patient Name: Angel Price MRN: [id number] Epilepsy Attending: Lora Havens Referring Physician/Provider: Dr. Gean Birchwood Date: 05/21/2019 Duration: 23.52 minutes Patient history: 54 year old female who presented with shortness of breath and confusion, diagnosed with COVID-19 infection.  EEG to evaluate for seizures.  Level of alertness: Awake, sleep AEDs during EEG study: Ativan Technical aspects: This EEG study was done with scalp electrodes positioned according to the 10-20 International system of electrode placement. Electrical activity was acquired at a sampling rate of 500Hz  and reviewed with a high frequency filter of 70Hz  and a low frequency filter of 1Hz . EEG data were recorded continuously and digitally stored. Description: The posterior dominant rhythm consists of 8 Hz activity of moderate voltage (25-35 uV) seen predominantly in posterior head regions, symmetric and reactive to eye opening and eye closing. There is an excessive amount of 15 to 18 Hz, 2-3 uV beta activity with irregular morphology distributed symmetrically and diffusely.  Sleep was characterized by vertex waves, sleep spindles (13 to 15 Hz), maximal frontocentral physiologic photic driving was seen during photic stimulation.  Hyperventilation was not performed. Abnormality - Excessive beta, generalized IMPRESSION: This study is within normal limits. The excessive beta activity seen in the background is most likely due to the effect of benzodiazepine and is a benign EEG pattern. No seizures or definite epileptiform discharges were seen throughout the recording. Lora Havens   CT Head Wo  Contrast  Result Date: 05/19/2019 CLINICAL DATA:  Possible acute stroke. EXAM: CT HEAD WITHOUT CONTRAST TECHNIQUE: Contiguous axial images were obtained from the base of the skull through the vertex without intravenous contrast. COMPARISON:  May 16, 2019 CTA brain and MR brain examinations. FINDINGS: Brain: No evidence of acute infarction, hemorrhage, hydrocephalus, extra-axial collection or mass lesion/mass effect. Empty sella turcica. Benign pineal calcification. Normal brain parenchymal volume. No vascular territory distribution encephalomalacia. Vascular: No hyperdense vessel or unexpected calcification. Volume averaging of the sella turcica and basilar artery in the axial sequence without hyperdensity of this vessel in the sagittal or coronal views. Skull: Normal calvarium. Sinuses/Orbits: Normal orbital soft tissues.Mild mucoperiosteal disease of the bilateral ethmoidal and left sphenoidal sinuses. Secretions in the left ethmoid sphenoid and bilateral maxillary sinuses. Other: Normal aeration of the bilateral mastoid air cells. IMPRESSION: No acute intracranial hemorrhage or edema. MR brain without contrast can provide a more sensitive exclusion of an acute ischemia if clinically appropriate. Mild  paranasal sinus mucoperiosteal disease and secretions. Empty sella turcica. Electronically Signed   By: Revonda Humphrey   On: 05/19/2019 18:10   DG Chest Port 1 View  Result Date: 05/19/2019 CLINICAL DATA:  Shortness of breath, respiratory distress EXAM: PORTABLE CHEST 1 VIEW COMPARISON:  Portable exam 1758 hours compared to 11/11/2018 FINDINGS: Upper normal heart size. Normal mediastinal contours and pulmonary vascularity. Lungs clear. No infiltrate, pleural effusion, or pneumothorax. Osseous structures unremarkable. IMPRESSION: No acute abnormalities. Electronically Signed   By: Lavonia Dana M.D.   On: 05/19/2019 18:12   DG Chest Port 1V same Day  Result Date: 05/20/2019 CLINICAL DATA:  Is shortness of  breath COVID positive EXAM: PORTABLE CHEST 1 VIEW COMPARISON:  05/19/2019 FINDINGS: Subtle airspace process along the minor fissure in the right chest is similar. No signs of dense consolidation or evidence of pleural effusion. Also with subtle opacity at the right lung base. Cardiomediastinal contours are stable. Hilar structures are unremarkable. Visualized skeletal structures are normal. Postoperative changes are again noted in the upper abdomen to the right of the spine. IMPRESSION: Stable subtle airspace process along the minor fissure in the right chest and right lung base. Electronically Signed   By: Zetta Bills M.D.   On: 05/20/2019 07:56   ECHOCARDIOGRAM COMPLETE  Result Date: 05/20/2019    ECHOCARDIOGRAM REPORT   Patient Name:   Angel Price Date of Exam: 05/20/2019 Medical Rec #:  [id number]        Height:       63.0 in Accession #:    [id number]       Weight:       139.1 lb Date of Birth:  1965/07/31        BSA:          1.657 m Patient Age:    28 years         BP:           124/85 mmHg Patient Gender: F                HR:           63 bpm. Exam Location:  Inpatient Procedure: 2D Echo Indications:    Chest Pain 786.50 / R07.9  History:        Patient has no prior history of Echocardiogram examinations.                 Risk Factors:Hypertension. Covid 19 virus.  Sonographer:    Vikki Ports Turrentine Referring Phys: [place]  1. Left ventricular ejection fraction, by estimation, is 65 to 70%. The left ventricle has hyperdynamic function. The left ventricle has no regional wall motion abnormalities. Left ventricular diastolic parameters are consistent with Grade I diastolic dysfunction (impaired relaxation).  2. Right ventricular systolic function is normal. The right ventricular size is normal.  3. The mitral valve is normal in structure and function. No evidence of mitral valve regurgitation. No evidence of mitral stenosis.  4. The aortic valve is normal in structure and  function. Aortic valve regurgitation is not visualized. No aortic stenosis is present.  5. The inferior vena cava is normal in size with greater than 50% respiratory variability, suggesting right atrial pressure of 3 mmHg. FINDINGS  Left Ventricle: Left ventricular ejection fraction, by estimation, is 65 to 70%. The left ventricle has hyperdynamic function. The left ventricle has no regional wall motion abnormalities. The left ventricular internal cavity size was normal in size. There is no  left ventricular hypertrophy. Left ventricular diastolic parameters are consistent with Grade I diastolic dysfunction (impaired relaxation). Indeterminate filling pressures. Right Ventricle: The right ventricular size is normal. No increase in right ventricular wall thickness. Right ventricular systolic function is normal. Left Atrium: Left atrial size was normal in size. Right Atrium: Right atrial size was normal in size. Pericardium: There is no evidence of pericardial effusion. Mitral Valve: The mitral valve is normal in structure and function. Normal mobility of the mitral valve leaflets. No evidence of mitral valve regurgitation. No evidence of mitral valve stenosis. Tricuspid Valve: The tricuspid valve is normal in structure. Tricuspid valve regurgitation is not demonstrated. No evidence of tricuspid stenosis. Aortic Valve: The aortic valve is normal in structure and function. Aortic valve regurgitation is not visualized. No aortic stenosis is present. Pulmonic Valve: The pulmonic valve was normal in structure. Pulmonic valve regurgitation is not visualized. No evidence of pulmonic stenosis. Aorta: The aortic root is normal in size and structure. Venous: The inferior vena cava is normal in size with greater than 50% respiratory variability, suggesting right atrial pressure of 3 mmHg. IAS/Shunts: No atrial level shunt detected by color flow Doppler.  LEFT VENTRICLE PLAX 2D LVIDd:         4.46 cm  Diastology LVIDs:          2.93 cm  LV e' lateral:   6.20 cm/s LV PW:         1.07 cm  LV E/e' lateral: 11.3 LV IVS:        1.06 cm  LV e' medial:    6.31 cm/s LVOT diam:     2.00 cm  LV E/e' medial:  11.1 LV SV:         96 LV SV Index:   58 LVOT Area:     3.14 cm  RIGHT VENTRICLE RV S prime:     14.90 cm/s TAPSE (M-mode): 2.3 cm LEFT ATRIUM             Index       RIGHT ATRIUM           Index LA diam:        3.50 cm 2.11 cm/m  RA Area:     12.00 cm LA Vol (A2C):   46.7 ml 28.18 ml/m RA Volume:   23.20 ml  14.00 ml/m LA Vol (A4C):   31.4 ml 18.95 ml/m LA Biplane Vol: 39.2 ml 23.65 ml/m  AORTIC VALVE LVOT Vmax:   141.00 cm/s LVOT Vmean:  94.400 cm/s LVOT VTI:    0.304 m  AORTA Ao Root diam: 3.20 cm MITRAL VALVE MV Area (PHT): 2.66 cm    SHUNTS MV Decel Time: 285 msec    Systemic VTI:  0.30 m MV E velocity: 69.80 cm/s  Systemic Diam: 2.00 cm MV A velocity: 85.70 cm/s MV E/A ratio:  0.81 Mihai Croitoru MD Electronically signed by Sanda Klein MD Signature Date/Time: 05/20/2019/11:19:42 AM    Final    VAS [place] LOWER EXTREMITY VENOUS (DVT)  Result Date: 05/20/2019  Lower Venous DVTStudy Indications: Elevated Ddimer.  Risk Factors: COVID 19 positive. Comparison Study: No prior studies. Performing Technologist: Oliver Hum RVT  Examination Guidelines: A complete evaluation includes B-mode imaging, spectral Doppler, color Doppler, and power Doppler as needed of all accessible portions of each vessel. Bilateral testing is considered an integral part of a complete examination. Limited examinations for reoccurring indications may be performed as noted. The reflux portion of the exam is performed with  the patient in reverse Trendelenburg.  +---------+---------------+---------+-----------+----------+--------------+ RIGHT    CompressibilityPhasicitySpontaneityPropertiesThrombus Aging +---------+---------------+---------+-----------+----------+--------------+ CFV      Full           Yes      Yes                                  +---------+---------------+---------+-----------+----------+--------------+ SFJ      Full                                                        +---------+---------------+---------+-----------+----------+--------------+ FV Prox  Full                                                        +---------+---------------+---------+-----------+----------+--------------+ FV Mid   Full                                                        +---------+---------------+---------+-----------+----------+--------------+ FV DistalFull                                                        +---------+---------------+---------+-----------+----------+--------------+ PFV      Full                                                        +---------+---------------+---------+-----------+----------+--------------+ POP      Full           Yes      Yes                                 +---------+---------------+---------+-----------+----------+--------------+ PTV      Full                                                        +---------+---------------+---------+-----------+----------+--------------+ PERO     Full                                                        +---------+---------------+---------+-----------+----------+--------------+   +---------+---------------+---------+-----------+----------+--------------+ LEFT     CompressibilityPhasicitySpontaneityPropertiesThrombus Aging +---------+---------------+---------+-----------+----------+--------------+ CFV      Full           Yes      Yes                                 +---------+---------------+---------+-----------+----------+--------------+  SFJ      Full                                                        +---------+---------------+---------+-----------+----------+--------------+ FV Prox  Full                                                         +---------+---------------+---------+-----------+----------+--------------+ FV Mid   Full                                                        +---------+---------------+---------+-----------+----------+--------------+ FV DistalFull                                                        +---------+---------------+---------+-----------+----------+--------------+ PFV      Full                                                        +---------+---------------+---------+-----------+----------+--------------+ POP      Full           Yes      Yes                                 +---------+---------------+---------+-----------+----------+--------------+ PTV      Full                                                        +---------+---------------+---------+-----------+----------+--------------+ PERO     Full                                                        +---------+---------------+---------+-----------+----------+--------------+     Summary: RIGHT: - There is no evidence of deep vein thrombosis in the lower extremity.  - No cystic structure found in the popliteal fossa.  LEFT: - There is no evidence of deep vein thrombosis in the lower extremity.  - No cystic structure found in the popliteal fossa.  *See table(s) above for measurements and observations. Electronically signed by Servando Snare MD on 05/20/2019 at 4:08:48 PM.    Final

## 2019-05-22 LAB — COMPREHENSIVE METABOLIC PANEL WITH GFR
ALT: 44 U/L (ref 0–44)
AST: 57 U/L — ABNORMAL HIGH (ref 15–41)
Albumin: 3.2 g/dL — ABNORMAL LOW (ref 3.5–5.0)
Alkaline Phosphatase: 70 U/L (ref 38–126)
Anion gap: 13 (ref 5–15)
BUN: 11 mg/dL (ref 6–20)
CO2: 17 mmol/L — ABNORMAL LOW (ref 22–32)
Calcium: 8.7 mg/dL — ABNORMAL LOW (ref 8.9–10.3)
Chloride: 108 mmol/L (ref 98–111)
Creatinine, Ser: 0.76 mg/dL (ref 0.44–1.00)
GFR calc Af Amer: >60 mL/min
GFR calc non Af Amer: >60 mL/min
Glucose, Bld: 164 mg/dL — ABNORMAL HIGH (ref 70–99)
Potassium: 3.5 mmol/L (ref 3.5–5.1)
Sodium: 138 mmol/L (ref 135–145)
Total Bilirubin: 0.3 mg/dL (ref 0.3–1.2)
Total Protein: 6.2 g/dL — ABNORMAL LOW (ref 6.5–8.1)

## 2019-05-22 LAB — CBC
HCT: 33.2 % — ABNORMAL LOW (ref 36.0–46.0)
Hemoglobin: 11.2 g/dL — ABNORMAL LOW (ref 12.0–15.0)
MCH: 28.5 pg (ref 26.0–34.0)
MCHC: 33.7 g/dL (ref 30.0–36.0)
MCV: 84.5 fL (ref 80.0–100.0)
Platelets: 246 10*3/uL (ref 150–400)
RBC: 3.93 MIL/uL (ref 3.87–5.11)
RDW: 14.7 % (ref 11.5–15.5)
WBC: 8.5 10*3/uL (ref 4.0–10.5)
nRBC: 0 % (ref 0.0–0.2)

## 2019-05-22 LAB — CULTURE, BLOOD (ROUTINE X 2)
Culture: NO GROWTH
Culture: NO GROWTH
Special Requests: ADEQUATE

## 2019-05-22 MED ORDER — AMOXICILLIN-POT CLAVULANATE 875-125 MG PO TABS: 1.0000 | ORAL_TABLET | Freq: Two times a day (BID) | ORAL | 0 refills | Status: AC

## 2019-05-22 NOTE — TOC Transition Note (Addendum)
Transition of Care [facility]) - CM/SW Discharge Note   Patient Details  Name: Angel Price MRN: [id number] Date of Birth: [birth year]  Transition of Care [facility]) CM/SW Contact:  Carles Collet, RN Phone Number: 05/22/2019, 11:53 AM   Clinical Narrative:    [name] w patient. She states she will DC to ome w boyfriend who will be providing transport. Cell phone number and address in chart verified. She would like [facility] services. Referral made, accepted     Final next level of care: Home w Home Health Services Barriers to Discharge: No Barriers Identified   Patient Goals and CMS Choice Patient states their goals for this hospitalization and ongoing recovery are:: to go home CMS Medicare.gov Compare Post Acute Care list provided to:: Patient Choice offered to / list presented to : Patient  Discharge Placement                       Discharge Plan and Services                          HH Arranged: PT [facility] Agency: [place] ([facility]) Date [place]: 05/22/19 Time [place]: [id number] Representative spoke with at [place]: [place] ([place]) Interventions     Readmission Risk Interventions No flowsheet data found.

## 2019-05-22 NOTE — Discharge Summary (Signed)
PATIENT DETAILS Name: Angel Price Age: 54 y.o. Sex: female Date of Birth: 1965/11/16 MRN: [id number]. Admitting Physician: Jonetta Osgood, MD [id number], Brandon Melnick, MD  Admit Date: 05/19/2019 Discharge date: 05/22/2019  Recommendations for Outpatient Follow-up:  1. Follow up with PCP in 1-2 weeks 2. Please obtain CMP/CBC in one week 3. Repeat TSH, free T4 in 1-2 months. 4. Ensure outpatient follow-up with general surgery and cardiology.  Admitted From:  Home  Disposition: Home with home health services   Colt:  Yes  Equipment/Devices: None  Discharge Condition: Stable  CODE STATUS: FULL CODE  Diet recommendation:  Diet Order            Diet - low sodium heart healthy        Diet Heart Room service appropriate? Yes; Fluid consistency: Thin  Diet effective now               Brief Narrative: Patient is a 54 y.o. 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  She was discharged in a stable condition on 3/2-apparently on 3/3-while walking up 1 flight of stairs (12 steps) patient started to complain of shortness of breath and lost consciousness.  Per significant other (spoke to over the [phone]) patient did not have a pulse-when BLS/fire trucks came-they started CPR for approximately 10 minutes, when EMS arrived patient was awake/alert and had a pulse.  She was brought to the ED on 100% NRB mask-but was rapidly titrated down to 4 L.  UDS was negative, however alcohol level was 225.  She was then admitted to the [facility] hospitalist service for further evaluation and treatment.  Significant Events: 2/28-3/2>> admit and discharge after evaluation of right-sided weakness  (psychogenic) 2/21>> MRI brain-no infarct 2/28>> CTA head/neck-no LVO 3/2>> CT angio chest-no PE 3/3>> readmit to [facility] for syncope/respiratory failure-per documentation CPR performed by BLS 3/3>> alcohol level of 225, UDS negative 2/21>> presented to the ED with right-sided weakness-neuro consulted-no further work-up required  Microbiology data: 3/1>> blood culture: No growth  Procedures/studies: 3/5>>EEG-no seizure 3/4>>TTE-EF 65-70% 2/28>> CTA head/neck-no LVO  Consults: 3/4>> cardiology consult  2/28>> neuro consult-psychogenic etiology of right sided weakness 3/1>> psych consult 2/21>> neuro consult-psychogenic rt weakness-no further work-up required  Brief Hospital Course: Syncope: Appreciate cardiology eval-not thought to have had a cardiac arrest-likely syncopized-etiology not clear but this is in the setting of alcohol intoxication (alcohol level of 225).  Telemetry unremarkable.  Recent CTA chest on 3/2 neg for PE, lower extremity Doppler negative for DVT.  EEG without seizures.  Echo with preserved EF.  Doubt any further work-up required at this point.  Acute Hypoxic Resp Failure secondary to aspiration pneumonitis: Brought to the ED on 100% NRB-then titrated to 4 L of oxygen via nasal cannula-remained stable on room air for the past 2 days.  Suspect may have aspirated during syncope-CPR in the setting of alcohol intoxication-hence on IV Unasyn.  She remained stable-we will transition her to Augmentin for a few more days on discharge.  COVID-19 infection: See above regarding concern for aspiration pneumonia-she is on room air this morning.  I do not think she has any active COVID-19 infection-she is mostly asymptomatic.  Her initial hypoxemia could have been from syncope and resultant aspiration pneumonia rather than COVID-19.  Her inflammatory markers are normal.  No indication to start steroids/remdesivir for now.     COVID-19 Labs:  Recent Labs    05/19/19 1638  05/19/19 1700 05/20/19 0957 05/21/19 0813  DDIMER  --  2.87* 2.77* 4.92*  FERRITIN  --  88 69 55  LDH 143  --   --   --   CRP  --  0.9 0.9 0.6    No results found for: SARSCOV2NAA   Elevated D-dimer: Not hypoxic-recent CTA chest negative for PE.  Lower extremity Doppler negative for DVT.  Doubt further work-up required.   EtOH abuse: Last drink on 3/3-presented with a alcohol level of 225.  Tremors have markedly improved-she is alert awake and alert-she has been counseled-she claims to drink 3 beer cans (25 ounces) of beer every other day.    She was managed with Ativan per CIWA protocol.  Not sure if she has any intention of quitting-although she says she does.  HTN: Stable with atenolol  Chronically draining abdominal wound/chronic abdominal pain/chronic pancreatitis: Appreciate wound care eval--continue outpatient follow-up with primary surgeon.  Claims she is on narcotics in the outpatient setting that is provided by her outpatient physicians.  No narcotic prescriptions will be provided on discharge.  Depression: Continue Lexapro  Very mild right-sided weakness: Has had neurology evaluation x2 in the past few weeks-numerous imaging studies unrevealing.  When distracted most both right upper and lower extremities very spontaneously-but when asked to squeeze my hands-she has some mild weakness.  Supportive care for now.  ?  History of hypothyroidism: TSH and free T4 suppressed-repeat in the outpatient setting-?  Sick euthyroid syndrome  History of cocaine abuse: Claims she is clean for 1 year-recent UDS negative.    Procedures None  Discharge Diagnoses:  Principal Problem:   Acute respiratory failure with hypoxia (HCC) Active Problems:   Essential hypertension   COVID-19   Syncope   Discharge Instructions:    Person Under Monitoring Name: Angel Price  Location: [address] [place] [address]   Infection Prevention Recommendations for  Individuals Confirmed to have, or Being Evaluated for, 2019 Novel Coronavirus (COVID-19) Infection Who Receive Care at Home  Individuals who are confirmed to have, or are being evaluated for, COVID-19 should follow the prevention steps below until a healthcare provider or local or state health department says they can return to normal activities.  Stay home except to get medical care You should restrict activities outside your home, except for getting medical care. Do not go to work, school, or public areas, and do not use public transportation or taxis.  Call ahead before visiting your doctor Before your medical appointment, call the healthcare provider and tell them that you have, or are being evaluated for, COVID-19 infection. This will help the healthcare provider's office take steps to keep other people from getting infected. Ask your healthcare provider to call the local or state health department.  Monitor your symptoms Seek prompt medical attention if your illness is worsening (e.g., difficulty breathing). Before going to your medical appointment, call the healthcare provider and tell them that you have, or are being evaluated for, COVID-19 infection. Ask your healthcare provider to call the local or state health department.  Wear a facemask You should wear a facemask that covers your nose and mouth when you are in the same room with other people and when you visit a healthcare provider. People who live with or visit you should also wear a  facemask while they are in the same room with you.  Separate yourself from other people in your home As much as possible, you should stay in a different room from other people in your home. Also, you should use a separate bathroom, if available.  Avoid sharing household items You should not share dishes, drinking glasses, cups, eating utensils, towels, bedding, or other items with other people in your home. After using these items, you  should wash them thoroughly with soap and water.  Cover your coughs and sneezes Cover your mouth and nose with a tissue when you cough or sneeze, or you can cough or sneeze into your sleeve. Throw used tissues in a lined trash can, and immediately wash your hands with soap and water for at least 20 seconds or use an alcohol-based hand rub.  Wash your [facility] your hands often and thoroughly with soap and water for at least 20 seconds. You can use an alcohol-based hand sanitizer if soap and water are not available and if your hands are not visibly dirty. Avoid touching your eyes, nose, and mouth with unwashed hands.   Prevention Steps for Caregivers and Household Members of Individuals Confirmed to have, or Being Evaluated for, COVID-19 Infection Being Cared for in the Home  If you live with, or provide care at home for, a person confirmed to have, or being evaluated for, COVID-19 infection please follow these guidelines to prevent infection:  Follow healthcare provider's instructions Make sure that you understand and can help the patient follow any healthcare provider instructions for all care.  Provide for the patient's basic needs You should help the patient with basic needs in the home and provide support for getting groceries, prescriptions, and other personal needs.  Monitor the patient's symptoms If they are getting sicker, call his or her medical provider and tell them that the patient has, or is being evaluated for, COVID-19 infection. This will help the healthcare provider's office take steps to keep other people from getting infected. Ask the healthcare provider to call the local or state health department.  Limit the number of people who have contact with the patient  If possible, have only one caregiver for the patient.  Other household members should stay in another home or place of residence. If this is not possible, they should stay  in another room, or be  separated from the patient as much as possible. Use a separate bathroom, if available.  Restrict visitors who do not have an essential need to be in the home.  Keep older adults, very young children, and other sick people away from the patient Keep older adults, very young children, and those who have compromised immune systems or chronic health conditions away from the patient. This includes people with chronic heart, lung, or kidney conditions, diabetes, and cancer.  Ensure good ventilation Make sure that shared spaces in the home have good air flow, such as from an air conditioner or an opened window, weather permitting.  Wash your hands often  Wash your hands often and thoroughly with soap and water for at least 20 seconds. You can use an alcohol based hand sanitizer if soap and water are not available and if your hands are not visibly dirty.  Avoid touching your eyes, nose, and mouth with unwashed hands.  Use disposable paper towels to dry your hands. If not available, use dedicated cloth towels and replace them when they become wet.  Wear a facemask and gloves  Wear a  disposable facemask at all times in the room and gloves when you touch or have contact with the patient's blood, body fluids, and/or secretions or excretions, such as sweat, saliva, sputum, nasal mucus, vomit, urine, or feces.  Ensure the mask fits over your nose and mouth tightly, and do not touch it during use.  Throw out disposable facemasks and gloves after using them. Do not reuse.  Wash your hands immediately after removing your facemask and gloves.  If your personal clothing becomes contaminated, carefully remove clothing and launder. Wash your hands after handling contaminated clothing.  Place all used disposable facemasks, gloves, and other waste in a lined container before disposing them with other household waste.  Remove gloves and wash your hands immediately after handling these items.  Do not share  dishes, glasses, or other household items with the patient  Avoid sharing household items. You should not share dishes, drinking glasses, cups, eating utensils, towels, bedding, or other items with a patient who is confirmed to have, or being evaluated for, COVID-19 infection.  After the person uses these items, you should wash them thoroughly with soap and water.  Wash laundry thoroughly  Immediately remove and wash clothes or bedding that have blood, body fluids, and/or secretions or excretions, such as sweat, saliva, sputum, nasal mucus, vomit, urine, or feces, on them.  Wear gloves when handling laundry from the patient.  Read and follow directions on labels of laundry or clothing items and detergent. In general, wash and dry with the warmest temperatures recommended on the label.  Clean all areas the individual has used often  Clean all touchable surfaces, such as counters, tabletops, doorknobs, bathroom fixtures, toilets, phones, keyboards, tablets, and bedside tables, every day. Also, clean any surfaces that may have blood, body fluids, and/or secretions or excretions on them.  Wear gloves when cleaning surfaces the patient has come in contact with.  Use a diluted bleach solution (e.g., dilute bleach with 1 part bleach and 10 parts water) or a household disinfectant with a label that says EPA-registered for coronaviruses. To make a bleach solution at home, add 1 tablespoon of bleach to 1 quart (4 cups) of water. For a larger supply, add  cup of bleach to 1 gallon (16 cups) of water.  Read labels of cleaning products and follow recommendations provided on product labels. Labels contain instructions for safe and effective use of the cleaning product including precautions you should take when applying the product, such as wearing gloves or eye protection and making sure you have good ventilation during use of the product.  Remove gloves and wash hands immediately after  cleaning.  Monitor yourself for signs and symptoms of illness Caregivers and household members are considered close contacts, should monitor their health, and will be asked to limit movement outside of the home to the extent possible. Follow the monitoring steps for close contacts listed on the symptom monitoring form.   ? If you have additional questions, contact your local health department or call the epidemiologist on call at [phone] (available 24/7). ? This guidance is subject to change. For the most up-to-date guidance from [facility], please refer to their website: YouBlogs.pl    Activity:  As tolerated    Discharge Instructions    Call MD for:  extreme fatigue   Complete by: As directed    Call MD for:  persistant dizziness or light-headedness   Complete by: As directed    Call MD for:  persistant nausea and vomiting  Complete by: As directed    Call MD for:  redness, tenderness, or signs of infection (pain, swelling, redness, odor or green/yellow discharge around incision site)   Complete by: As directed    Diet - low sodium heart healthy   Complete by: As directed    Discharge instructions   Complete by: As directed    Follow with Primary MD  Rosita Fire, MD in 1-2 weeks  Please get a complete blood count and chemistry panel checked by your Primary MD at your next visit, and again as instructed by your Primary MD.  Get Medicines reviewed and adjusted: Please take all your medications with you for your next visit with your Primary MD  Laboratory/radiological data: Please request your Primary MD to go over all hospital tests and procedure/radiological results at the follow up, please ask your Primary MD to get all Hospital records sent to his/her office.  In some cases, they will be blood work, cultures and biopsy results pending at the time of your discharge. Please request that your primary care M.D.  follows up on these results.  Also Note the following: If you experience worsening of your admission symptoms, develop shortness of breath, life threatening emergency, suicidal or homicidal thoughts you must seek medical attention immediately by calling 911 or calling your MD immediately  if symptoms less severe.  You must read complete instructions/literature along with all the possible adverse reactions/side effects for all the Medicines you take and that have been prescribed to you. Take any new Medicines after you have completely understood and accpet all the possible adverse reactions/side effects.   Do not drive when taking Pain medications or sleeping medications (Benzodaizepines)  Do not take more than prescribed Pain, Sleep and Anxiety Medications. It is not advisable to combine anxiety,sleep and pain medications without talking with your primary care practitioner  Special Instructions: If you have smoked or chewed Tobacco  in the last 2 yrs please stop smoking, stop any regular Alcohol  and or any Recreational drug use.  Wear Seat belts while driving.  Please note: You were cared for by a hospitalist during your hospital stay. Once you are discharged, your primary care physician will handle any further medical issues. Please note that NO REFILLS for any discharge medications will be authorized once you are discharged, as it is imperative that you return to your primary care physician (or establish a relationship with a primary care physician if you do not have one) for your post hospital discharge needs so that they can reassess your need for medications and monitor your lab values.   Increase activity slowly   Complete by: As directed      Allergies as of 05/22/2019      Reactions   Codeine Hives   Morphine And Related Hives   Reglan [metoclopramide] Other (See Comments)   EPS symptoms       Medication List    TAKE these medications   amLODipine 10 MG tablet Commonly known  as: NORVASC Take 10 mg by mouth daily.   amoxicillin-clavulanate 875-125 MG tablet Commonly known as: Augmentin Take 1 tablet by mouth 2 (two) times daily for 3 days.   aspirin EC 81 MG tablet Take 81 mg by mouth daily.   atenolol 50 MG tablet Commonly known as: TENORMIN Take 50 mg by mouth daily.   dicyclomine 10 MG capsule Commonly known as: BENTYL Take 10 mg by mouth 4 (four) times daily -  before meals and at bedtime.  escitalopram 20 MG tablet Commonly known as: LEXAPRO Take 20 mg by mouth daily.   folic acid 1 MG tablet Commonly known as: FOLVITE Take 1 mg by mouth daily.   ondansetron 4 MG tablet Commonly known as: ZOFRAN Take 4 mg by mouth every 8 (eight) hours as needed for nausea or vomiting.   pantoprazole 40 MG tablet Commonly known as: PROTONIX Take 40 mg by mouth daily.   promethazine 25 MG tablet Commonly known as: PHENERGAN Take 25 mg by mouth every 8 (eight) hours as needed for nausea or vomiting.   Promethegan 25 MG suppository Generic drug: promethazine Place 25 mg rectally every 8 (eight) hours as needed for nausea or vomiting.   thiamine 100 MG tablet Take 100 mg by mouth daily.   VITAMIN B12 PO Take 1 tablet by mouth daily.      Follow-up Information    Rosita Fire, MD. Schedule an appointment as soon as possible for a visit in 1 week(s).   Specialty: Internal Medicine Contact information: [place] [address] [phone]        Nigel Mormon, MD. Schedule an appointment as soon as possible for a visit in 2 week(s).   Specialties: Cardiology, Radiology Contact information: [place] [address] [phone]          Allergies  Allergen Reactions  . Codeine Hives  . Morphine And Related Hives  . Reglan [Metoclopramide] Other (See Comments)    EPS symptoms      Other Procedures/Studies: EEG  Result Date: 05/21/2019 Lora Havens, MD      05/21/2019  2:19 PM Patient Name: Angel Price MRN: [id number] Epilepsy Attending: Lora Havens Referring Physician/Provider: Dr. Gean Birchwood Date: 05/21/2019 Duration: 23.52 minutes Patient history: 54 year old female who presented with shortness of breath and confusion, diagnosed with COVID-19 infection.  EEG to evaluate for seizures.  Level of alertness: Awake, sleep AEDs during EEG study: Ativan Technical aspects: This EEG study was done with scalp electrodes positioned according to the 10-20 International system of electrode placement. Electrical activity was acquired at a sampling rate of 500Hz  and reviewed with a high frequency filter of 70Hz  and a low frequency filter of 1Hz . EEG data were recorded continuously and digitally stored. Description: The posterior dominant rhythm consists of 8 Hz activity of moderate voltage (25-35 uV) seen predominantly in posterior head regions, symmetric and reactive to eye opening and eye closing. There is an excessive amount of 15 to 18 Hz, 2-3 uV beta activity with irregular morphology distributed symmetrically and diffusely.  Sleep was characterized by vertex waves, sleep spindles (13 to 15 Hz), maximal frontocentral physiologic photic driving was seen during photic stimulation.  Hyperventilation was not performed. Abnormality - Excessive beta, generalized IMPRESSION: This study is within normal limits. The excessive beta activity seen in the background is most likely due to the effect of benzodiazepine and is a benign EEG pattern. No seizures or definite epileptiform discharges were seen throughout the recording. Lora Havens   CT Head Wo Contrast  Result Date: 05/19/2019 CLINICAL DATA:  Possible acute stroke. EXAM: CT HEAD WITHOUT CONTRAST TECHNIQUE: Contiguous axial images were obtained from the base of the skull through the vertex without intravenous contrast. COMPARISON:  May 16, 2019 CTA brain and MR brain examinations. FINDINGS: Brain: No  evidence of acute infarction, hemorrhage, hydrocephalus, extra-axial collection or mass lesion/mass effect. Empty sella turcica. Benign pineal calcification. Normal brain parenchymal volume. No vascular  territory distribution encephalomalacia. Vascular: No hyperdense vessel or unexpected calcification. Volume averaging of the sella turcica and basilar artery in the axial sequence without hyperdensity of this vessel in the sagittal or coronal views. Skull: Normal calvarium. Sinuses/Orbits: Normal orbital soft tissues.Mild mucoperiosteal disease of the bilateral ethmoidal and left sphenoidal sinuses. Secretions in the left ethmoid sphenoid and bilateral maxillary sinuses. Other: Normal aeration of the bilateral mastoid air cells. IMPRESSION: No acute intracranial hemorrhage or edema. MR brain without contrast can provide a more sensitive exclusion of an acute ischemia if clinically appropriate. Mild paranasal sinus mucoperiosteal disease and secretions. Empty sella turcica. Electronically Signed   By: Revonda Humphrey   On: 05/19/2019 18:10   DG Chest Port 1 View  Result Date: 05/19/2019 CLINICAL DATA:  Shortness of breath, respiratory distress EXAM: PORTABLE CHEST 1 VIEW COMPARISON:  Portable exam 1758 hours compared to 11/11/2018 FINDINGS: Upper normal heart size. Normal mediastinal contours and pulmonary vascularity. Lungs clear. No infiltrate, pleural effusion, or pneumothorax. Osseous structures unremarkable. IMPRESSION: No acute abnormalities. Electronically Signed   By: Lavonia Dana M.D.   On: 05/19/2019 18:12   DG Chest Port 1V same Day  Result Date: 05/20/2019 CLINICAL DATA:  Is shortness of breath COVID positive EXAM: PORTABLE CHEST 1 VIEW COMPARISON:  05/19/2019 FINDINGS: Subtle airspace process along the minor fissure in the right chest is similar. No signs of dense consolidation or evidence of pleural effusion. Also with subtle opacity at the right lung base. Cardiomediastinal contours are stable. Hilar  structures are unremarkable. Visualized skeletal structures are normal. Postoperative changes are again noted in the upper abdomen to the right of the spine. IMPRESSION: Stable subtle airspace process along the minor fissure in the right chest and right lung base. Electronically Signed   By: Zetta Bills M.D.   On: 05/20/2019 07:56   ECHOCARDIOGRAM COMPLETE  Result Date: 05/20/2019    ECHOCARDIOGRAM REPORT   Patient Name:   Angel Price Date of Exam: 05/20/2019 Medical Rec #:  [id number]        Height:       63.0 in Accession #:    [id number]       Weight:       139.1 lb Date of Birth:  January 03, 1966        BSA:          1.657 m Patient Age:    51 years         BP:           124/85 mmHg Patient Gender: F                HR:           63 bpm. Exam Location:  Inpatient Procedure: 2D Echo Indications:    Chest Pain 786.50 / R07.9  History:        Patient has no prior history of Echocardiogram examinations.                 Risk Factors:Hypertension. Covid 19 virus.  Sonographer:    Vikki Ports T[place]ine Referring Phys: Cucumber  1. Left ventricular ejection fraction, by estimation, is 65 to 70%. The left ventricle has hyperdynamic function. The left ventricle has no regional wall motion abnormalities. Left ventricular diastolic parameters are consistent with Grade I diastolic dysfunction (impaired relaxation).  2. Right ventricular systolic function is normal. The right ventricular size is normal.  3. The mitral valve is normal in structure and function. No evidence of  mitral valve regurgitation. No evidence of mitral stenosis.  4. The aortic valve is normal in structure and function. Aortic valve regurgitation is not visualized. No aortic stenosis is present.  5. The inferior vena cava is normal in size with greater than 50% respiratory variability, suggesting right atrial pressure of 3 mmHg. FINDINGS  Left Ventricle: Left ventricular ejection fraction, by estimation, is 65 to 70%. The left  ventricle has hyperdynamic function. The left ventricle has no regional wall motion abnormalities. The left ventricular internal cavity size was normal in size. There is no left ventricular hypertrophy. Left ventricular diastolic parameters are consistent with Grade I diastolic dysfunction (impaired relaxation). Indeterminate filling pressures. Right Ventricle: The right ventricular size is normal. No increase in right ventricular wall thickness. Right ventricular systolic function is normal. Left Atrium: Left atrial size was normal in size. Right Atrium: Right atrial size was normal in size. Pericardium: There is no evidence of pericardial effusion. Mitral Valve: The mitral valve is normal in structure and function. Normal mobility of the mitral valve leaflets. No evidence of mitral valve regurgitation. No evidence of mitral valve stenosis. Tricuspid Valve: The tricuspid valve is normal in structure. Tricuspid valve regurgitation is not demonstrated. No evidence of tricuspid stenosis. Aortic Valve: The aortic valve is normal in structure and function. Aortic valve regurgitation is not visualized. No aortic stenosis is present. Pulmonic Valve: The pulmonic valve was normal in structure. Pulmonic valve regurgitation is not visualized. No evidence of pulmonic stenosis. Aorta: The aortic root is normal in size and structure. Venous: The inferior vena cava is normal in size with greater than 50% respiratory variability, suggesting right atrial pressure of 3 mmHg. IAS/Shunts: No atrial level shunt detected by color flow Doppler.  LEFT VENTRICLE PLAX 2D LVIDd:         4.46 cm  Diastology LVIDs:         2.93 cm  LV e' lateral:   6.20 cm/s LV PW:         1.07 cm  LV E/e' lateral: 11.3 LV IVS:        1.06 cm  LV e' medial:    6.31 cm/s LVOT diam:     2.00 cm  LV E/e' medial:  11.1 LV SV:         96 LV SV Index:   58 LVOT Area:     3.14 cm  RIGHT VENTRICLE RV S prime:     14.90 cm/s TAPSE (M-mode): 2.3 cm LEFT ATRIUM              Index       RIGHT ATRIUM           Index LA diam:        3.50 cm 2.11 cm/m  RA Area:     12.00 cm LA Vol (A2C):   46.7 ml 28.18 ml/m RA Volume:   23.20 ml  14.00 ml/m LA Vol (A4C):   31.4 ml 18.95 ml/m LA Biplane Vol: 39.2 ml 23.65 ml/m  AORTIC VALVE LVOT Vmax:   141.00 cm/s LVOT Vmean:  94.400 cm/s LVOT VTI:    0.304 m  AORTA Ao Root diam: 3.20 cm MITRAL VALVE MV Area (PHT): 2.66 cm    SHUNTS MV Decel Time: 285 msec    Systemic VTI:  0.30 m MV E velocity: 69.80 cm/s  Systemic Diam: 2.00 cm MV A velocity: 85.70 cm/s MV E/A ratio:  0.81 Mihai Croitoru MD Electronically signed by Sanda Klein MD Signature Date/Time: 05/20/2019/11:19:42  AM    Final    VAS [place] LOWER EXTREMITY VENOUS (DVT)  Result Date: 05/20/2019  Lower Venous DVTStudy Indications: Elevated Ddimer.  Risk Factors: COVID 19 positive. Comparison Study: No prior studies. Performing Technologist: Oliver Hum RVT  Examination Guidelines: A complete evaluation includes B-mode imaging, spectral Doppler, color Doppler, and power Doppler as needed of all accessible portions of each vessel. Bilateral testing is considered an integral part of a complete examination. Limited examinations for reoccurring indications may be performed as noted. The reflux portion of the exam is performed with the patient in reverse Trendelenburg.  +---------+---------------+---------+-----------+----------+--------------+ RIGHT    CompressibilityPhasicitySpontaneityPropertiesThrombus Aging +---------+---------------+---------+-----------+----------+--------------+ CFV      Full           Yes      Yes                                 +---------+---------------+---------+-----------+----------+--------------+ SFJ      Full                                                        +---------+---------------+---------+-----------+----------+--------------+ FV Prox  Full                                                         +---------+---------------+---------+-----------+----------+--------------+ FV Mid   Full                                                        +---------+---------------+---------+-----------+----------+--------------+ FV DistalFull                                                        +---------+---------------+---------+-----------+----------+--------------+ PFV      Full                                                        +---------+---------------+---------+-----------+----------+--------------+ POP      Full           Yes      Yes                                 +---------+---------------+---------+-----------+----------+--------------+ PTV      Full                                                        +---------+---------------+---------+-----------+----------+--------------+ PERO     Full                                                        +---------+---------------+---------+-----------+----------+--------------+   +---------+---------------+---------+-----------+----------+--------------+  LEFT     CompressibilityPhasicitySpontaneityPropertiesThrombus Aging +---------+---------------+---------+-----------+----------+--------------+ CFV      Full           Yes      Yes                                 +---------+---------------+---------+-----------+----------+--------------+ SFJ      Full                                                        +---------+---------------+---------+-----------+----------+--------------+ FV Prox  Full                                                        +---------+---------------+---------+-----------+----------+--------------+ FV Mid   Full                                                        +---------+---------------+---------+-----------+----------+--------------+ FV DistalFull                                                         +---------+---------------+---------+-----------+----------+--------------+ PFV      Full                                                        +---------+---------------+---------+-----------+----------+--------------+ POP      Full           Yes      Yes                                 +---------+---------------+---------+-----------+----------+--------------+ PTV      Full                                                        +---------+---------------+---------+-----------+----------+--------------+ PERO     Full                                                        +---------+---------------+---------+-----------+----------+--------------+     Summary: RIGHT: - There is no evidence of deep vein thrombosis in the lower extremity.  - No cystic structure found in the popliteal fossa.  LEFT: - There is no evidence of deep vein thrombosis in the lower extremity.  - No  cystic structure found in the popliteal fossa.  *See table(s) above for measurements and observations. Electronically signed by Servando Snare MD on 05/20/2019 at 4:08:48 PM.    Final      TODAY-DAY OF DISCHARGE:  Subjective:   Angel Price today has no headache,no chest abdominal pain,no new weakness tingling or numbness, feels much better wants to go home today.   Objective:   Blood pressure 111/80, pulse 94, temperature 98.1 F (36.7 C), temperature source Oral, resp. rate 17, height 5\' 3"  (1.6 m), weight 63.1 kg, SpO2 98 %.  Intake/Output Summary (Last 24 hours) at 05/22/2019 1141 Last data filed at 05/22/2019 0900 Gross per 24 hour  Intake 1930 ml  Output 1200 ml  Net 730 ml   Filed Weights   05/19/19 1705 05/19/19 2210  Weight: 62.6 kg 63.1 kg    Exam: Awake Alert, Oriented *3, No new F.N deficits, Normal affect [place].AT,PERRAL Supple Neck,No JVD, No cervical lymphadenopathy appriciated.  Symmetrical Chest wall movement, Good air movement bilaterally, CTAB RRR,No Gallops,Rubs or new Murmurs,  No Parasternal Heave +ve B.Sounds, Abd Soft, Non tender, No organomegaly appriciated, No rebound -guarding or rigidity. No Cyanosis, Clubbing or edema, No new Rash or bruise   PERTINENT RADIOLOGIC STUDIES: EEG  Result Date: 05/21/2019 Lora Havens, MD     05/21/2019  2:19 PM Patient Name: Angel Price MRN: [id number] Epilepsy Attending: Lora Havens Referring Physician/Provider: Dr. Gean Birchwood Date: 05/21/2019 Duration: 23.52 minutes Patient history: 54 year old female who presented with shortness of breath and confusion, diagnosed with COVID-19 infection.  EEG to evaluate for seizures.  Level of alertness: Awake, sleep AEDs during EEG study: Ativan Technical aspects: This EEG study was done with scalp electrodes positioned according to the 10-20 International system of electrode placement. Electrical activity was acquired at a sampling rate of 500Hz  and reviewed with a high frequency filter of 70Hz  and a low frequency filter of 1Hz . EEG data were recorded continuously and digitally stored. Description: The posterior dominant rhythm consists of 8 Hz activity of moderate voltage (25-35 uV) seen predominantly in posterior head regions, symmetric and reactive to eye opening and eye closing. There is an excessive amount of 15 to 18 Hz, 2-3 uV beta activity with irregular morphology distributed symmetrically and diffusely.  Sleep was characterized by vertex waves, sleep spindles (13 to 15 Hz), maximal frontocentral physiologic photic driving was seen during photic stimulation.  Hyperventilation was not performed. Abnormality - Excessive beta, generalized IMPRESSION: This study is within normal limits. The excessive beta activity seen in the background is most likely due to the effect of benzodiazepine and is a benign EEG pattern. No seizures or definite epileptiform discharges were seen throughout the recording. Lora Havens   CT Head Wo Contrast  Result Date: 05/19/2019 CLINICAL DATA:   Possible acute stroke. EXAM: CT HEAD WITHOUT CONTRAST TECHNIQUE: Contiguous axial images were obtained from the base of the skull through the vertex without intravenous contrast. COMPARISON:  May 16, 2019 CTA brain and MR brain examinations. FINDINGS: Brain: No evidence of acute infarction, hemorrhage, hydrocephalus, extra-axial collection or mass lesion/mass effect. Empty sella turcica. Benign pineal calcification. Normal brain parenchymal volume. No vascular territory distribution encephalomalacia. Vascular: No hyperdense vessel or unexpected calcification. Volume averaging of the sella turcica and basilar artery in the axial sequence without hyperdensity of this vessel in the sagittal or coronal views. Skull: Normal calvarium. Sinuses/Orbits: Normal orbital soft tissues.Mild mucoperiosteal disease of the bilateral ethmoidal and left sphenoidal sinuses. Secretions in the  left ethmoid sphenoid and bilateral maxillary sinuses. Other: Normal aeration of the bilateral mastoid air cells. IMPRESSION: No acute intracranial hemorrhage or edema. MR brain without contrast can provide a more sensitive exclusion of an acute ischemia if clinically appropriate. Mild paranasal sinus mucoperiosteal disease and secretions. Empty sella turcica. Electronically Signed   By: Revonda Humphrey   On: 05/19/2019 18:10   DG Chest Port 1 View  Result Date: 05/19/2019 CLINICAL DATA:  Shortness of breath, respiratory distress EXAM: PORTABLE CHEST 1 VIEW COMPARISON:  Portable exam 1758 hours compared to 11/11/2018 FINDINGS: Upper normal heart size. Normal mediastinal contours and pulmonary vascularity. Lungs clear. No infiltrate, pleural effusion, or pneumothorax. Osseous structures unremarkable. IMPRESSION: No acute abnormalities. Electronically Signed   By: Lavonia Dana M.D.   On: 05/19/2019 18:12   DG Chest Port 1V same Day  Result Date: 05/20/2019 CLINICAL DATA:  Is shortness of breath COVID positive EXAM: PORTABLE CHEST 1 VIEW  COMPARISON:  05/19/2019 FINDINGS: Subtle airspace process along the minor fissure in the right chest is similar. No signs of dense consolidation or evidence of pleural effusion. Also with subtle opacity at the right lung base. Cardiomediastinal contours are stable. Hilar structures are unremarkable. Visualized skeletal structures are normal. Postoperative changes are again noted in the upper abdomen to the right of the spine. IMPRESSION: Stable subtle airspace process along the minor fissure in the right chest and right lung base. Electronically Signed   By: Zetta Bills M.D.   On: 05/20/2019 07:56   ECHOCARDIOGRAM COMPLETE  Result Date: 05/20/2019    ECHOCARDIOGRAM REPORT   Patient Name:   Angel Price Date of Exam: 05/20/2019 Medical Rec #:  [id number]        Height:       63.0 in Accession #:    [id number]       Weight:       139.1 lb Date of Birth:  [birth year]        BSA:          1.657 m Patient Age:    83 years         BP:           124/85 mmHg Patient Gender: F                HR:           63 bpm. Exam Location:  Inpatient Procedure: 2D Echo Indications:    Chest Pain 786.50 / R07.9  History:        Patient has no prior history of Echocardiogram examinations.                 Risk Factors:Hypertension. Covid 19 virus.  Sonographer:    Vikki Ports Turrentine Referring Phys: [place]  1. Left ventricular ejection fraction, by estimation, is 65 to 70%. The left ventricle has hyperdynamic function. The left ventricle has no regional wall motion abnormalities. Left ventricular diastolic parameters are consistent with Grade I diastolic dysfunction (impaired relaxation).  2. Right ventricular systolic function is normal. The right ventricular size is normal.  3. The mitral valve is normal in structure and function. No evidence of mitral valve regurgitation. No evidence of mitral stenosis.  4. The aortic valve is normal in structure and function. Aortic valve regurgitation is not  visualized. No aortic stenosis is present.  5. The inferior vena cava is normal in size with greater than 50% respiratory variability, suggesting right atrial pressure of 3 mmHg. FINDINGS  Left Ventricle: Left ventricular ejection fraction, by estimation, is 65 to 70%. The left ventricle has hyperdynamic function. The left ventricle has no regional wall motion abnormalities. The left ventricular internal cavity size was normal in size. There is no left ventricular hypertrophy. Left ventricular diastolic parameters are consistent with Grade I diastolic dysfunction (impaired relaxation). Indeterminate filling pressures. Right Ventricle: The right ventricular size is normal. No increase in right ventricular wall thickness. Right ventricular systolic function is normal. Left Atrium: Left atrial size was normal in size. Right Atrium: Right atrial size was normal in size. Pericardium: There is no evidence of pericardial effusion. Mitral Valve: The mitral valve is normal in structure and function. Normal mobility of the mitral valve leaflets. No evidence of mitral valve regurgitation. No evidence of mitral valve stenosis. Tricuspid Valve: The tricuspid valve is normal in structure. Tricuspid valve regurgitation is not demonstrated. No evidence of tricuspid stenosis. Aortic Valve: The aortic valve is normal in structure and function. Aortic valve regurgitation is not visualized. No aortic stenosis is present. Pulmonic Valve: The pulmonic valve was normal in structure. Pulmonic valve regurgitation is not visualized. No evidence of pulmonic stenosis. Aorta: The aortic root is normal in size and structure. Venous: The inferior vena cava is normal in size with greater than 50% respiratory variability, suggesting right atrial pressure of 3 mmHg. IAS/Shunts: No atrial level shunt detected by color flow Doppler.  LEFT VENTRICLE PLAX 2D LVIDd:         4.46 cm  Diastology LVIDs:         2.93 cm  LV e' lateral:   6.20 cm/s LV PW:          1.07 cm  LV E/e' lateral: 11.3 LV IVS:        1.06 cm  LV e' medial:    6.31 cm/s LVOT diam:     2.00 cm  LV E/e' medial:  11.1 LV SV:         96 LV SV Index:   58 LVOT Area:     3.14 cm  RIGHT VENTRICLE RV S prime:     14.90 cm/s TAPSE (M-mode): 2.3 cm LEFT ATRIUM             Index       RIGHT ATRIUM           Index LA diam:        3.50 cm 2.11 cm/m  RA Area:     12.00 cm LA Vol (A2C):   46.7 ml 28.18 ml/m RA Volume:   23.20 ml  14.00 ml/m LA Vol (A4C):   31.4 ml 18.95 ml/m LA Biplane Vol: 39.2 ml 23.65 ml/m  AORTIC VALVE LVOT Vmax:   141.00 cm/s LVOT Vmean:  94.400 cm/s LVOT VTI:    0.304 m  AORTA Ao Root diam: 3.20 cm MITRAL VALVE MV Area (PHT): 2.66 cm    SHUNTS MV Decel Time: 285 msec    Systemic VTI:  0.30 m MV E velocity: 69.80 cm/s  Systemic Diam: 2.00 cm MV A velocity: 85.70 cm/s MV E/A ratio:  0.81 Mihai Croitoru MD Electronically signed by Sanda Klein MD Signature Date/Time: 05/20/2019/11:19:42 AM    Final    VAS [place] LOWER EXTREMITY VENOUS (DVT)  Result Date: 05/20/2019  Lower Venous DVTStudy Indications: Elevated Ddimer.  Risk Factors: COVID 19 positive. Comparison Study: No prior studies. Performing Technologist: Oliver Hum RVT  Examination Guidelines: A complete evaluation includes B-mode imaging, spectral Doppler, color Doppler, and power  Doppler as needed of all accessible portions of each vessel. Bilateral testing is considered an integral part of a complete examination. Limited examinations for reoccurring indications may be performed as noted. The reflux portion of the exam is performed with the patient in reverse Trendelenburg.  +---------+---------------+---------+-----------+----------+--------------+ RIGHT    CompressibilityPhasicitySpontaneityPropertiesThrombus Aging +---------+---------------+---------+-----------+----------+--------------+ CFV      Full           Yes      Yes                                  +---------+---------------+---------+-----------+----------+--------------+ SFJ      Full                                                        +---------+---------------+---------+-----------+----------+--------------+ FV Prox  Full                                                        +---------+---------------+---------+-----------+----------+--------------+ FV Mid   Full                                                        +---------+---------------+---------+-----------+----------+--------------+ FV DistalFull                                                        +---------+---------------+---------+-----------+----------+--------------+ PFV      Full                                                        +---------+---------------+---------+-----------+----------+--------------+ POP      Full           Yes      Yes                                 +---------+---------------+---------+-----------+----------+--------------+ PTV      Full                                                        +---------+---------------+---------+-----------+----------+--------------+ PERO     Full                                                        +---------+---------------+---------+-----------+----------+--------------+   +---------+---------------+---------+-----------+----------+--------------+  LEFT     CompressibilityPhasicitySpontaneityPropertiesThrombus Aging +---------+---------------+---------+-----------+----------+--------------+ CFV      Full           Yes      Yes                                 +---------+---------------+---------+-----------+----------+--------------+ SFJ      Full                                                        +---------+---------------+---------+-----------+----------+--------------+ FV Prox  Full                                                         +---------+---------------+---------+-----------+----------+--------------+ FV Mid   Full                                                        +---------+---------------+---------+-----------+----------+--------------+ FV DistalFull                                                        +---------+---------------+---------+-----------+----------+--------------+ PFV      Full                                                        +---------+---------------+---------+-----------+----------+--------------+ POP      Full           Yes      Yes                                 +---------+---------------+---------+-----------+----------+--------------+ PTV      Full                                                        +---------+---------------+---------+-----------+----------+--------------+ PERO     Full                                                        +---------+---------------+---------+-----------+----------+--------------+     Summary: RIGHT: - There is no evidence of deep vein thrombosis in the lower extremity.  - No cystic structure found in the popliteal fossa.  LEFT: - There is no evidence of deep vein thrombosis in the lower extremity.  - No  cystic structure found in the popliteal fossa.  *See table(s) above for measurements and observations. Electronically signed by Servando Snare MD on 05/20/2019 at 4:08:48 PM.    Final      PERTINENT LAB RESULTS: CBC: Recent Labs    05/21/19 0813 05/22/19 0337  WBC 8.9 8.5  HGB 11.3* 11.2*  HCT 32.9* 33.2*  PLT 256 246   CMET CMP     Component Value Date/Time   NA 138 05/22/2019 0337   K 3.5 05/22/2019 0337   CL 108 05/22/2019 0337   CO2 17 (L) 05/22/2019 0337   GLUCOSE 164 (H) 05/22/2019 0337   BUN 11 05/22/2019 0337   CREATININE 0.76 05/22/2019 0337   CALCIUM 8.7 (L) 05/22/2019 0337   PROT 6.2 (L) 05/22/2019 0337   ALBUMIN 3.2 (L) 05/22/2019 0337   AST 57 (H) 05/22/2019 0337   ALT 44 05/22/2019  0337   ALKPHOS 70 05/22/2019 0337   BILITOT 0.3 05/22/2019 0337   GFRNONAA >60 05/22/2019 0337   GFRAA >60 05/22/2019 0337    GFR Estimated Creatinine Clearance: 74.5 mL/min (by C-G formula based on SCr of 0.76 mg/dL). Recent Labs    05/20/19 0149  LIPASE 17   No results for input(s): CKTOTAL, CKMB, CKMBINDEX, TROPONINI in the last 72 hours. Invalid input(s): POCBNP Recent Labs    05/20/19 0957 05/21/19 0813  DDIMER 2.77* 4.92*   No results for input(s): HGBA1C in the last 72 hours. Recent Labs    05/19/19 1638  TRIG 270*   Recent Labs    05/20/19 0957  TSH 0.270*   Recent Labs    05/20/19 0957 05/21/19 0813  FERRITIN 69 55   Coags: No results for input(s): INR in the last 72 hours.  Invalid input(s): PT Microbiology: No results found for this or any previous visit (from the past 240 hour(s)).  FURTHER DISCHARGE INSTRUCTIONS:  Get Medicines reviewed and adjusted: Please take all your medications with you for your next visit with your Primary MD  Laboratory/radiological data: Please request your Primary MD to go over all hospital tests and procedure/radiological results at the follow up, please ask your Primary MD to get all Hospital records sent to his/her office.  In some cases, they will be blood work, cultures and biopsy results pending at the time of your discharge. Please request that your primary care M.D. goes through all the records of your hospital data and follows up on these results.  Also Note the following: If you experience worsening of your admission symptoms, develop shortness of breath, life threatening emergency, suicidal or homicidal thoughts you must seek medical attention immediately by calling 911 or calling your MD immediately  if symptoms less severe.  You must read complete instructions/literature along with all the possible adverse reactions/side effects for all the Medicines you take and that have been prescribed to you. Take any  new Medicines after you have completely understood and accpet all the possible adverse reactions/side effects.   Do not drive when taking Pain medications or sleeping medications (Benzodaizepines)  Do not take more than prescribed Pain, Sleep and Anxiety Medications. It is not advisable to combine anxiety,sleep and pain medications without talking with your primary care practitioner  Special Instructions: If you have smoked or chewed Tobacco  in the last 2 yrs please stop smoking, stop any regular Alcohol  and or any Recreational drug use.  Wear Seat belts while driving.  Please note: You were cared for by a hospitalist during your hospital stay.  Once you are discharged, your primary care physician will handle any further medical issues. Please note that NO REFILLS for any discharge medications will be authorized once you are discharged, as it is imperative that you return to your primary care physician (or establish a relationship with a primary care physician if you do not have one) for your post hospital discharge needs so that they can reassess your need for medications and monitor your lab values.  Total Time spent coordinating discharge including counseling, education and face to face time equals 35 minutes.  SignedOren Binet 05/22/2019 11:41 AM

## 2019-05-22 NOTE — Discharge Instructions (Signed)
Person Under Monitoring Name: Angel Price  Location: [address] [place] [address]   Infection Prevention Recommendations for Individuals Confirmed to have, or Being Evaluated for, 2019 Novel Coronavirus (COVID-19) Infection Who Receive Care at Home  Individuals who are confirmed to have, or are being evaluated for, COVID-19 should follow the prevention steps below until a healthcare provider or local or state health department says they can return to normal activities.  Stay home except to get medical care You should restrict activities outside your home, except for getting medical care. Do not go to work, school, or public areas, and do not use public transportation or taxis.  Call ahead before visiting your doctor Before your medical appointment, call the healthcare provider and tell them that you have, or are being evaluated for, COVID-19 infection. This will help the healthcare provider's office take steps to keep other people from getting infected. Ask your healthcare provider to call the local or state health department.  Monitor your symptoms Seek prompt medical attention if your illness is worsening (e.g., difficulty breathing). Before going to your medical appointment, call the healthcare provider and tell them that you have, or are being evaluated for, COVID-19 infection. Ask your healthcare provider to call the local or state health department.  Wear a facemask You should wear a facemask that covers your nose and mouth when you are in the same room with other people and when you visit a healthcare provider. People who live with or visit you should also wear a facemask while they are in the same room with you.  Separate yourself from other people in your home As much as possible, you should stay in a different room from other people in your home. Also, you should use a separate bathroom, if available.  Avoid sharing household items You  should not share dishes, drinking glasses, cups, eating utensils, towels, bedding, or other items with other people in your home. After using these items, you should wash them thoroughly with soap and water.  Cover your coughs and sneezes Cover your mouth and nose with a tissue when you cough or sneeze, or you can cough or sneeze into your sleeve. Throw used tissues in a lined trash can, and immediately wash your hands with soap and water for at least 20 seconds or use an alcohol-based hand rub.  Wash your [facility] your hands often and thoroughly with soap and water for at least 20 seconds. You can use an alcohol-based hand sanitizer if soap and water are not available and if your hands are not visibly dirty. Avoid touching your eyes, nose, and mouth with unwashed hands.   Prevention Steps for Caregivers and Household Members of Individuals Confirmed to have, or Being Evaluated for, COVID-19 Infection Being Cared for in the Home  If you live with, or provide care at home for, a person confirmed to have, or being evaluated for, COVID-19 infection please follow these guidelines to prevent infection:  Follow healthcare provider's instructions Make sure that you understand and can help the patient follow any healthcare provider instructions for all care.  Provide for the patient's basic needs You should help the patient with basic needs in the home and provide support for getting groceries, prescriptions, and other personal needs.  Monitor the patient's symptoms If they are getting sicker, call his or her medical provider and tell them that the patient has, or is being evaluated for, COVID-19 infection. This will help the  healthcare provider's office take steps to keep other people from getting infected. Ask the healthcare provider to call the local or state health department.  Limit the number of people who have contact with the patient  If possible, have only one caregiver for the  patient.  Other household members should stay in another home or place of residence. If this is not possible, they should stay  in another room, or be separated from the patient as much as possible. Use a separate bathroom, if available.  Restrict visitors who do not have an essential need to be in the home.  Keep older adults, very young children, and other sick people away from the patient Keep older adults, very young children, and those who have compromised immune systems or chronic health conditions away from the patient. This includes people with chronic heart, lung, or kidney conditions, diabetes, and cancer.  Ensure good ventilation Make sure that shared spaces in the home have good air flow, such as from an air conditioner or an opened window, weather permitting.  Wash your hands often  Wash your hands often and thoroughly with soap and water for at least 20 seconds. You can use an alcohol based hand sanitizer if soap and water are not available and if your hands are not visibly dirty.  Avoid touching your eyes, nose, and mouth with unwashed hands.  Use disposable paper towels to dry your hands. If not available, use dedicated cloth towels and replace them when they become wet.  Wear a facemask and gloves  Wear a disposable facemask at all times in the room and gloves when you touch or have contact with the patient's blood, body fluids, and/or secretions or excretions, such as sweat, saliva, sputum, nasal mucus, vomit, urine, or feces.  Ensure the mask fits over your nose and mouth tightly, and do not touch it during use.  Throw out disposable facemasks and gloves after using them. Do not reuse.  Wash your hands immediately after removing your facemask and gloves.  If your personal clothing becomes contaminated, carefully remove clothing and launder. Wash your hands after handling contaminated clothing.  Place all used disposable facemasks, gloves, and other waste in a lined  container before disposing them with other household waste.  Remove gloves and wash your hands immediately after handling these items.  Do not share dishes, glasses, or other household items with the patient  Avoid sharing household items. You should not share dishes, drinking glasses, cups, eating utensils, towels, bedding, or other items with a patient who is confirmed to have, or being evaluated for, COVID-19 infection.  After the person uses these items, you should wash them thoroughly with soap and water.  Wash laundry thoroughly  Immediately remove and wash clothes or bedding that have blood, body fluids, and/or secretions or excretions, such as sweat, saliva, sputum, nasal mucus, vomit, urine, or feces, on them.  Wear gloves when handling laundry from the patient.  Read and follow directions on labels of laundry or clothing items and detergent. In general, wash and dry with the warmest temperatures recommended on the label.  Clean all areas the individual has used often  Clean all touchable surfaces, such as counters, tabletops, doorknobs, bathroom fixtures, toilets, phones, keyboards, tablets, and bedside tables, every day. Also, clean any surfaces that may have blood, body fluids, and/or secretions or excretions on them.  Wear gloves when cleaning surfaces the patient has come in contact with.  Use a diluted bleach solution (e.g., dilute bleach  with 1 part bleach and 10 parts water) or a household disinfectant with a label that says EPA-registered for coronaviruses. To make a bleach solution at home, add 1 tablespoon of bleach to 1 quart (4 cups) of water. For a larger supply, add  cup of bleach to 1 gallon (16 cups) of water.  Read labels of cleaning products and follow recommendations provided on product labels. Labels contain instructions for safe and effective use of the cleaning product including precautions you should take when applying the product, such as wearing gloves or  eye protection and making sure you have good ventilation during use of the product.  Remove gloves and wash hands immediately after cleaning.  Monitor yourself for signs and symptoms of illness Caregivers and household members are considered close contacts, should monitor their health, and will be asked to limit movement outside of the home to the extent possible. Follow the monitoring steps for close contacts listed on the symptom monitoring form.   ? If you have additional questions, contact your local health department or call the epidemiologist on call at [phone] (available 24/7). ? This guidance is subject to change. For the most up-to-date guidance from [facility], please refer to their website: YouBlogs.pl

## 2019-05-22 NOTE — Progress Notes (Signed)
Pt given discharge instructions, prescriptions, and care notes. Pt verbalized understanding AEB no further questions or concerns at this time. IV was discontinued, no redness, pain, or swelling noted at this time. Telemetry discontinued and Centralized Telemetry was notified. Pt left the floor via wheelchair with staff in stable condition. 

## 2019-05-22 NOTE — Progress Notes (Signed)
Pt was given 1mg  Ativan IV @ 1040 from a 2mg /33ml vial. Remaining 1mg /0.71ml wasted after Pt had discharged, with Norton Blizzard RN.

## 2019-05-24 ENCOUNTER — Encounter (HOSPITAL_COMMUNITY): Payer: Self-pay | Admitting: Internal Medicine

## 2019-05-24 DIAGNOSIS — I10 Essential (primary) hypertension: Secondary | ICD-10-CM | POA: Diagnosis not present

## 2019-05-24 DIAGNOSIS — J9601 Acute respiratory failure with hypoxia: Secondary | ICD-10-CM | POA: Diagnosis not present

## 2019-05-24 DIAGNOSIS — T8189XD Other complications of procedures, not elsewhere classified, subsequent encounter: Secondary | ICD-10-CM | POA: Diagnosis not present

## 2019-05-24 DIAGNOSIS — R55 Syncope and collapse: Secondary | ICD-10-CM | POA: Diagnosis not present

## 2019-05-24 DIAGNOSIS — K861 Other chronic pancreatitis: Secondary | ICD-10-CM | POA: Diagnosis not present

## 2019-05-24 DIAGNOSIS — U071 COVID-19: Secondary | ICD-10-CM | POA: Diagnosis not present

## 2019-05-24 DIAGNOSIS — G8191 Hemiplegia, unspecified affecting right dominant side: Secondary | ICD-10-CM | POA: Diagnosis not present

## 2019-05-24 DIAGNOSIS — Z9884 Bariatric surgery status: Secondary | ICD-10-CM | POA: Diagnosis not present

## 2019-05-25 ENCOUNTER — Other Ambulatory Visit: Payer: Self-pay | Admitting: Cardiology

## 2019-05-25 DIAGNOSIS — I2510 Atherosclerotic heart disease of native coronary artery without angina pectoris: Secondary | ICD-10-CM

## 2019-05-26 ENCOUNTER — Ambulatory Visit: Payer: Medicare Other | Admitting: Cardiology

## 2019-05-28 DIAGNOSIS — K861 Other chronic pancreatitis: Secondary | ICD-10-CM | POA: Diagnosis not present

## 2019-05-28 DIAGNOSIS — G8191 Hemiplegia, unspecified affecting right dominant side: Secondary | ICD-10-CM | POA: Diagnosis not present

## 2019-05-28 DIAGNOSIS — I10 Essential (primary) hypertension: Secondary | ICD-10-CM | POA: Diagnosis not present

## 2019-05-28 DIAGNOSIS — T8189XD Other complications of procedures, not elsewhere classified, subsequent encounter: Secondary | ICD-10-CM | POA: Diagnosis not present

## 2019-05-28 DIAGNOSIS — U071 COVID-19: Secondary | ICD-10-CM | POA: Diagnosis not present

## 2019-05-28 DIAGNOSIS — J9601 Acute respiratory failure with hypoxia: Secondary | ICD-10-CM | POA: Diagnosis not present

## 2019-05-28 DIAGNOSIS — Z9884 Bariatric surgery status: Secondary | ICD-10-CM | POA: Diagnosis not present

## 2019-05-28 DIAGNOSIS — R55 Syncope and collapse: Secondary | ICD-10-CM | POA: Diagnosis not present

## 2019-06-02 DIAGNOSIS — R55 Syncope and collapse: Secondary | ICD-10-CM | POA: Diagnosis not present

## 2019-06-02 DIAGNOSIS — J1282 Pneumonia due to coronavirus disease 2019: Secondary | ICD-10-CM | POA: Diagnosis not present

## 2019-06-02 DIAGNOSIS — I10 Essential (primary) hypertension: Secondary | ICD-10-CM | POA: Diagnosis not present

## 2019-06-02 DIAGNOSIS — J9601 Acute respiratory failure with hypoxia: Secondary | ICD-10-CM | POA: Diagnosis not present

## 2019-06-03 ENCOUNTER — Other Ambulatory Visit: Payer: Self-pay

## 2019-06-03 ENCOUNTER — Ambulatory Visit (INDEPENDENT_AMBULATORY_CARE_PROVIDER_SITE_OTHER): Payer: Medicare Other | Admitting: Orthopaedic Surgery

## 2019-06-03 ENCOUNTER — Encounter: Payer: Self-pay | Admitting: Orthopaedic Surgery

## 2019-06-03 VITALS — Ht 63.0 in | Wt 143.0 lb

## 2019-06-03 DIAGNOSIS — G5601 Carpal tunnel syndrome, right upper limb: Secondary | ICD-10-CM

## 2019-06-03 MED ORDER — METHYLPREDNISOLONE 4 MG PO TBPK: ORAL_TABLET | ORAL | 0 refills | Status: DC

## 2019-06-03 NOTE — Addendum Note (Signed)
Addended by: Precious Bard on: 06/03/2019 05:08 PM   Modules accepted: Orders

## 2019-06-03 NOTE — Progress Notes (Signed)
Office Visit Note   Patient: Angel Price           Date of Birth: 1966-03-11           MRN: [id number] Visit Date: 06/03/2019              Requested by: Rosita Fire, MD [place],  [place] [address] PCP: Rosita Fire, MD   Assessment & Plan: Visit Diagnoses:  1. Right carpal tunnel syndrome     Plan: Impression is suspected right carpal tunnel syndrome.  We will order nerve conduction study to assess for this.  Medrol dose pak sent in today.  Follow up after NCS.  Follow-Up Instructions: Return if symptoms worsen or fail to improve.   Orders:  No orders of the defined types were placed in this encounter.  Meds ordered this encounter  Medications  . methylPREDNISolone (MEDROL DOSEPAK) 4 MG TBPK tablet    Sig: Use as directed    Dispense:  21 tablet    Refill:  0      Procedures: No procedures performed   Clinical Data: No additional findings.   Subjective: Chief Complaint  Patient presents with  . Right Hand - Pain    Angel Price is a 54 year old female comes in for evaluation of right thumb and index finger numbness and pain for the last month that is constant.  She is not currently taking any medications.  She is left-hand dominant.  No previous injuries.  She wears braces during the day.  She does endorse nighttime pain and trouble with dexterity dropping things out of her hand.   Review of Systems  Constitutional: Negative.   HENT: Negative.   Eyes: Negative.   Respiratory: Negative.   Cardiovascular: Negative.   Endocrine: Negative.   Musculoskeletal: Negative.   Neurological: Negative.   Hematological: Negative.   Psychiatric/Behavioral: Negative.   All other systems reviewed and are negative.    Objective: Vital Signs: Ht 5\' 3"  (1.6 m)   Wt 143 lb (64.9 kg)   LMP  (LMP Unknown)   BMI 25.33 kg/m   Physical Exam Vitals and nursing note reviewed.  Constitutional:      Appearance: She is well-developed.  HENT:   Head: Normocephalic and atraumatic.  Pulmonary:     Effort: Pulmonary effort is normal.  Abdominal:     Palpations: Abdomen is soft.  Musculoskeletal:     Cervical back: Neck supple.  Skin:    General: Skin is warm.     Capillary Refill: Capillary refill takes less than 2 seconds.  Neurological:     Mental Status: She is alert and oriented to person, place, and time.  Psychiatric:        Behavior: Behavior normal.        Thought Content: Thought content normal.        Judgment: Judgment normal.     Ortho Exam Right hand exam shows no masses lesions or ulcers.  She has decreased sensation in the tip of her thumb and index finger.  Positive carpal tunnel compressive signs.  No muscle atrophy. Specialty Comments:  No specialty comments available.  Imaging: No results found.   PMFS History: Patient Active Problem List   Diagnosis Date Noted  . Acute respiratory failure with hypoxia ([place]) 05/20/2019  . Essential hypertension 05/20/2019  . COVID-19 05/20/2019  . Syncope 05/20/2019  . Right sided weakness 05/16/2019  . Metabolic acidosis 0000000  . Intractable vomiting with nausea 02/23/2019  . Abnormal  CT scan, colon 12/09/2018  . Abnormal computed tomography of cecum and terminal ileum 12/09/2018  . Cocaine abuse ([place])   . Colitis 11/11/2018  . Hemorrhoidal skin tag   . SBO (small bowel obstruction) ([place])   . Non-intractable vomiting with nausea   . Folate deficiency 05/11/2017  . Abdominal wall fistula   . Cellulitis of abdominal wall 01/02/2017  . Acute left hemiparesis ([place]) 12/05/2016  . Malingering 12/05/2016  . Weakness of left lower extremity 12/02/2016  . CVA (cerebral vascular accident) ([place]) 11/23/2016  . Bright red rectal bleeding 08/22/2016  . Hypotension due to blood loss   . Acute GI bleeding 05/24/2016  . Dieulafoy lesion of duodenum   . History of Billroth II operation   . Gastrointestinal hemorrhage 05/22/2016  . Absolute anemia   . Acute  pancreatitis 10/01/2015  . Abnormal CT scan of lung 10/01/2015  . Alcohol intoxication ([place])   . Left-sided weakness   . Psychosomatic factor in physical condition   . Upper GI bleed   . Diverticulosis of colon with hemorrhage   . Chronic wound infection of abdomen 12/22/2014  . Thyroid disease 12/22/2014  . HTN (hypertension) 12/22/2014  . Ataxia 11/01/2014  . Hemorrhoids 04/26/2014  . Lung nodule 04/26/2014  . Diverticulosis   . Gastritis   . Hiatal hernia   . Schatzki's ring   . Acute blood loss anemia 04/25/2014  . Sinus tachycardia 04/25/2014  . Hypokalemia 04/25/2014  . Hematemesis with nausea 04/25/2014  . Lung nodule < 6cm on CT 04/25/2014  . Intractable nausea and vomiting 04/24/2014  . Gastroparesis   . Abdominal pain 04/01/2014  . Chronic abdominal pain 01/21/2014  . Gastroenteritis 12/10/2013  . Chronic abdominal wound infection 08/16/2013  . MDD (major depressive disorder), recurrent episode, severe ([place]) 06/27/2013  . Wrist laceration 06/24/2013  . Diarrhea 05/07/2013  . Rectal bleeding 05/07/2013  . Abnormal LFTs 05/07/2013  . Adrenal mass, left ([place]) 05/07/2013  . Abdominal wall abscess at site of surgical wound 04/19/2013  . Abdominal wall abscess 04/18/2013  . Frequent headaches 03/30/2013  . Sleep difficulties 03/30/2013  . Essential hypertension, benign 03/30/2013  . History of cocaine abuse ([place]) 03/16/2013  . History of schizoaffective disorder 03/16/2013  . Bipolar disorder ([place]) 03/16/2013  . Personality disorder ([place]) 03/16/2013  . Tobacco abuse 03/16/2013  . Alcohol abuse 03/16/2013  . Palpitations 03/16/2013  . Poor vision 03/16/2013  . History of gastric bypass 03/16/2013  . Status post hysterectomy with oophorectomy 03/16/2013  . Hypothyroid 03/16/2013  . Lupus ([place]) 03/16/2013   Past Medical History:  Diagnosis Date  . Abscess    soft tissue  . Adrenal mass ([place])   . Alcohol abuse   . Anxiety   . Blood transfusion without  reported diagnosis   . Chronic abdominal pain   . Chronic wound infection of abdomen   . Colon polyp    colonoscopy 04/2014  . Depression   . Diverticulosis    colonoscopy 04/2014 moderat pan colonic  . Drug-seeking behavior   . Gastritis    EGD 05/2014  . Gastroparesis Nov 2015  . GERD (gastroesophageal reflux disease)   . Hemorrhoid    internal large  . Hiatal hernia   . History of Billroth II operation   . Hypertension   . Lung nodule    CT 02/2014 needs repeat 1 month  . Lung nodule < 6cm on CT 04/25/2014  . Lupus ([place])   . Malingering   . Nausea and  vomiting    chronic, recurrent  . Pancreatitis   . Schatzki's ring    patent per EGD 04/2014  . Sickle cell trait ([place])   . Suicide attempt ([place])   . Thyroid disease 2000   overactive, radiation    Family History  Problem Relation Age of Onset  . Lung cancer Father   . Brain cancer Son   . Schizophrenia Son   . Cancer Son        brain  . Cancer Father        mets  . Drug abuse Mother   . Breast cancer Maternal Aunt   . Bipolar disorder Maternal Aunt   . Drug abuse Maternal Aunt   . Colon cancer Maternal Grandmother        late 62s, early 60s  . Drug abuse Sister   . Drug abuse Brother   . Bipolar disorder Paternal Grandfather   . Bipolar disorder Cousin   . Liver disease Neg Hx     Past Surgical History:  Procedure Laterality Date  . ABDOMINAL HYSTERECTOMY  2013   [place]  . ABDOMINAL HYSTERECTOMY    . ABDOMINAL SURGERY    . ADRENALECTOMY Right   . AGILE CAPSULE N/A 01/05/2015   Procedure: AGILE CAPSULE;  Surgeon: Daneil Dolin, MD;  [facility];  Service: Endoscopy;  Laterality: N/A;  0700  . Billroth II procedure      [place], first 2000, 2005/2006.  . bilroth 2    . BIOPSY  05/20/2013   Procedure: BIOPSIES OF ASCENDING AND SIGMOID COLON;  Surgeon: Daneil Dolin, MD;  Location: [facility];  Service: Endoscopy;;  . BIOPSY  04/26/2014   Procedure: BIOPSIES;  Surgeon: Danie Binder, MD;   Location: [facility];  Service: Endoscopy;;  . BIOPSY  12/24/2018   Procedure: BIOPSY;  Surgeon: Daneil Dolin, MD;  [facility];  Service: Endoscopy;;  colon  . CHOLECYSTECTOMY    . COLONOSCOPY     in [place]  . COLONOSCOPY WITH PROPOFOL N/A 05/20/2013   Dr.Rourk- inadequate prep, normal appearing rectum, grossly normal colon aside from pancolonic diverticula, normal terminal ileum bx= unremarkable colonic mucosa. Due for early interval 2016.   Marland Kitchen COLONOSCOPY WITH PROPOFOL N/A 04/26/2014   [id number] ileum/one colon polyp removed/moderate pan-colonic diverticulosis/large internal hemorrhoids  . COLONOSCOPY WITH PROPOFOL N/A 12/23/2014   Dr.Rourk- minimal internal hemorrhoids, pancolonic diverticulosis  . COLONOSCOPY WITH PROPOFOL N/A 08/23/2016   Procedure: COLONOSCOPY WITH PROPOFOL;  Surgeon: Danie Binder, MD;  [facility];  Service: Endoscopy;  Laterality: N/A;  . COLONOSCOPY WITH PROPOFOL N/A 12/24/2018   Pancolonic diverticulosis, normal TI, one 5 mm polyp in rectum (tubular adenoma). Somewhat friable hemorrhagic mucosa in area of IC valve/cecum, possibly related to scope trauma s/p biopsy.   . DEBRIDEMENT OF ABDOMINAL WALL ABSCESS N/A 02/08/2013   Procedure: DEBRIDEMENT OF ABDOMINAL WALL ABSCESS;  Surgeon: Jamesetta So, MD;  Location: [facility];  Service: General;  Laterality: N/A;  . ESOPHAGOGASTRODUODENOSCOPY (EGD) WITH PROPOFOL N/A 05/20/2013   Dr.Rourk- s/p prior gastric surgery with normal esophagus, residual gastric mucosa and patent efferent limb  . ESOPHAGOGASTRODUODENOSCOPY (EGD) WITH PROPOFOL N/A 02/03/2014   Dr. Gala Romney:  s/p hemigastrectomy with retained gastric contents. Residual gastric mucosa and efferent limb appeared normal otherwise. Query gastroparesis.   Marland Kitchen ESOPHAGOGASTRODUODENOSCOPY (EGD) WITH PROPOFOL N/A 04/26/2014   [id number] ring/small HH/mild non-erosive gasrtitis/normal anastomosis  . ESOPHAGOGASTRODUODENOSCOPY (EGD) WITH PROPOFOL N/A 12/23/2014    Dr.Rourk- s/p  prior hemigastrctomy, active oozing from anastomotic suture site, hemostasis achieved  . ESOPHAGOGASTRODUODENOSCOPY (EGD) WITH PROPOFOL N/A 05/23/2016   Dr. 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 ESOPHAGOGASTRODUODENOSCOPY (EGD) WITH PROPOFOL N/A 05/11/2017   Procedure: ESOPHAGOGASTRODUODENOSCOPY (EGD) WITH PROPOFOL;  Surgeon: Daneil Dolin, MD;  [facility];  Service: Endoscopy;  Laterality: N/A;  . HEMORRHOID SURGERY N/A 06/18/2017   Procedure: THREE COLUMN EXTENSIVE HEMORRHOIDECTOMY;  Surgeon: Virl Cagey, MD;  Location: [facility];  Service: General;  Laterality: N/A;  . INCISION AND DRAINAGE ABSCESS N/A 01/06/2017   Procedure: INCISION AND DRAINAGE ABDOMINAL WALL ABSCESS;  Surgeon: Aviva Signs, MD;  Location: [facility];  Service: General;  Laterality: N/A;  . POLYPECTOMY  12/24/2018   Procedure: POLYPECTOMY;  Surgeon: Daneil Dolin, MD;  [facility];  Service: Endoscopy;;  colon  . tendon repar Right    wrist  . WOUND EXPLORATION Right 06/24/2013   Procedure: exploration of traumatic wound right wrist;  Surgeon: Tennis Must, MD;  Location: [facility];  Service: Orthopedics;  Laterality: Right;   Social History   Occupational History  . Not on file  Tobacco Use  . Smoking status: Current Every Day Smoker    Packs/day: 0.50    Years: 35.00    Pack years: 17.50    Types: Cigarettes  . Smokeless tobacco: Never Used  Substance and Sexual Activity  . Alcohol use: Yes  . Drug use: Not Currently    Types: Cocaine  . Sexual activity: Not on file

## 2019-06-07 DIAGNOSIS — R55 Syncope and collapse: Secondary | ICD-10-CM | POA: Diagnosis not present

## 2019-06-07 DIAGNOSIS — K861 Other chronic pancreatitis: Secondary | ICD-10-CM | POA: Diagnosis not present

## 2019-06-07 DIAGNOSIS — I10 Essential (primary) hypertension: Secondary | ICD-10-CM | POA: Diagnosis not present

## 2019-06-07 DIAGNOSIS — J9601 Acute respiratory failure with hypoxia: Secondary | ICD-10-CM | POA: Diagnosis not present

## 2019-06-07 DIAGNOSIS — G8191 Hemiplegia, unspecified affecting right dominant side: Secondary | ICD-10-CM | POA: Diagnosis not present

## 2019-06-07 DIAGNOSIS — U071 COVID-19: Secondary | ICD-10-CM | POA: Diagnosis not present

## 2019-06-07 DIAGNOSIS — Z9884 Bariatric surgery status: Secondary | ICD-10-CM | POA: Diagnosis not present

## 2019-06-07 DIAGNOSIS — T8189XD Other complications of procedures, not elsewhere classified, subsequent encounter: Secondary | ICD-10-CM | POA: Diagnosis not present

## 2019-06-11 ENCOUNTER — Other Ambulatory Visit: Payer: Self-pay

## 2019-06-11 ENCOUNTER — Encounter (HOSPITAL_BASED_OUTPATIENT_CLINIC_OR_DEPARTMENT_OTHER): Payer: Medicare Other | Attending: Internal Medicine | Admitting: Internal Medicine

## 2019-06-11 DIAGNOSIS — Z809 Family history of malignant neoplasm, unspecified: Secondary | ICD-10-CM | POA: Diagnosis not present

## 2019-06-11 DIAGNOSIS — M329 Systemic lupus erythematosus, unspecified: Secondary | ICD-10-CM | POA: Insufficient documentation

## 2019-06-11 DIAGNOSIS — Z8249 Family history of ischemic heart disease and other diseases of the circulatory system: Secondary | ICD-10-CM | POA: Insufficient documentation

## 2019-06-11 DIAGNOSIS — Z823 Family history of stroke: Secondary | ICD-10-CM | POA: Diagnosis not present

## 2019-06-11 DIAGNOSIS — Z841 Family history of disorders of kidney and ureter: Secondary | ICD-10-CM | POA: Diagnosis not present

## 2019-06-11 DIAGNOSIS — D649 Anemia, unspecified: Secondary | ICD-10-CM | POA: Insufficient documentation

## 2019-06-11 DIAGNOSIS — Z888 Allergy status to other drugs, medicaments and biological substances status: Secondary | ICD-10-CM | POA: Diagnosis not present

## 2019-06-11 DIAGNOSIS — Z8379 Family history of other diseases of the digestive system: Secondary | ICD-10-CM | POA: Insufficient documentation

## 2019-06-11 DIAGNOSIS — Z836 Family history of other diseases of the respiratory system: Secondary | ICD-10-CM | POA: Insufficient documentation

## 2019-06-11 DIAGNOSIS — T8189XA Other complications of procedures, not elsewhere classified, initial encounter: Secondary | ICD-10-CM | POA: Diagnosis not present

## 2019-06-11 DIAGNOSIS — Z885 Allergy status to narcotic agent status: Secondary | ICD-10-CM | POA: Diagnosis not present

## 2019-06-11 DIAGNOSIS — L02211 Cutaneous abscess of abdominal wall: Secondary | ICD-10-CM | POA: Insufficient documentation

## 2019-06-11 DIAGNOSIS — L03311 Cellulitis of abdominal wall: Secondary | ICD-10-CM | POA: Diagnosis not present

## 2019-06-11 DIAGNOSIS — F17200 Nicotine dependence, unspecified, uncomplicated: Secondary | ICD-10-CM | POA: Diagnosis not present

## 2019-06-11 DIAGNOSIS — Z8711 Personal history of peptic ulcer disease: Secondary | ICD-10-CM | POA: Diagnosis not present

## 2019-06-11 DIAGNOSIS — Z833 Family history of diabetes mellitus: Secondary | ICD-10-CM | POA: Insufficient documentation

## 2019-06-11 DIAGNOSIS — E119 Type 2 diabetes mellitus without complications: Secondary | ICD-10-CM | POA: Diagnosis not present

## 2019-06-11 DIAGNOSIS — I10 Essential (primary) hypertension: Secondary | ICD-10-CM | POA: Diagnosis not present

## 2019-06-11 NOTE — Progress Notes (Signed)
Angel Price, Angel Price ([id number]) Visit Report for 06/11/2019 Abuse/Suicide Risk Screen Details Patient Name: Date of Service: Angel Price, Angel Price 06/11/2019 9:00 AM Medical Record [id number] Patient Account Number: [id number] Date of Birth/Sex: Treating RN: [birth year] (54 y.o. Orvan Falconer Primary Care Nima Kemppainen: Rosita Fire D Other Clinician: Referring Jamorian Dimaria: Treating Aryia Delira/Extender:Robson, Barbera Setters, Tesfaye D Weeks in Treatment: 0 Abuse/Suicide Risk Screen Items Answer ABUSE RISK SCREEN: Has anyone close to you tried to hurt or harm you recentlyo No Do you feel uncomfortable with anyone in your familyo No Has anyone forced you do things that you didnt want to doo No Electronic Signature(s) Signed: 06/11/2019 5:43:34 PM By: Carlene Coria RN Entered By: Carlene Coria on 06/11/2019 09:25:43 -------------------------------------------------------------------------------- Activities of Daily Living Details Patient Name: Date of Service: Angel Price, Angel Price 06/11/2019 9:00 AM Medical Record [id number] Patient Account Number: [id number] Date of Birth/Sex: Treating RN: March 06, 1966 (54 y.o. Orvan Falconer Primary Care Amesha Bailey: Rosita Fire D Other Clinician: Referring Brayton Baumgartner: Treating Aristotle Lieb/Extender:Robson, Barbera Setters, Tesfaye D Weeks in Treatment: 0 Activities of Daily Living Items Answer Activities of Daily Living (Please select one for each item) Drive Automobile Completely Able Take Medications Completely Able Use Telephone Completely Able Care for Appearance Completely Able Use Toilet Completely Able Bath / Shower Completely Able Dress Self Completely Able Feed Self Completely Able Walk Completely Able Get In / Out Bed Completely Able Housework Completely Able Prepare Meals Completely Able Handle Money Completely Able Shop for Self Completely Able Electronic Signature(s) Signed: 06/11/2019 5:43:34 PM By: Carlene Coria RN Entered By: Carlene Coria on 06/11/2019 09:26:12 -------------------------------------------------------------------------------- Education Screening Details Patient Name: Date of Service: Angel Price 06/11/2019 9:00 AM Medical Record [id number] Patient Account Number: [id number] Date of Birth/Sex: Treating RN: [birth year] (54 y.o. Orvan Falconer Primary Care Domitila Stetler: Rosita Fire D Other Clinician: Referring Chrisangel Eskenazi: Treating Porshea Janowski/Extender:Robson, Barbera Setters, Dimas Millin Weeks in Treatment: 0 Primary Learner Assessed: Patient Learning Preferences/Education Level/Primary Language Learning Preference: Explanation Highest Education Level: High School Preferred Language: English Cognitive Barrier Language Barrier: No Translator Needed: No Memory Deficit: No Emotional Barrier: No Cultural/Religious Beliefs Affecting Medical Care: No Physical Barrier Impaired Vision: Yes Glasses Impaired Hearing: No Decreased Hand dexterity: No Knowledge/Comprehension Knowledge Level: Medium Comprehension Level: High Ability to understand written High instructions: Ability to understand verbal High instructions: Motivation Anxiety Level: Calm Cooperation: Cooperative Education Importance: Acknowledges Need Interest in Health Problems: Asks Questions Perception: Coherent Willingness to Engage in Self- High Management Activities: Readiness to Engage in Self- High Management Activities: Electronic Signature(s) Signed: 06/11/2019 5:43:34 PM By: Carlene Coria RN Entered By: Carlene Coria on 06/11/2019 09:26:45 -------------------------------------------------------------------------------- Fall Risk Assessment Details Patient Name: Date of Service: Angel Price. 06/11/2019 9:00 AM Medical Record [id number] Patient Account Number: [id number] Date of Birth/Sex: Treating RN: 1965/08/30 (54 y.o. Orvan Falconer Primary Care Amberlee Garvey: Rosita Fire D Other Clinician: Referring  Robynn Marcel: Treating Marcus Schwandt/Extender:Robson, Barbera Setters, Tesfaye D Weeks in Treatment: 0 Fall Risk Assessment Items Have you had 2 or more falls in the last 12 monthso 0 No Have you had any fall that resulted in injury in the last 12 monthso 0 No FALLS RISK SCREEN History of falling - immediate or within 3 months 0 No Secondary diagnosis (Do you have 2 or more medical diagnoseso) 0 No Ambulatory aid None/bed rest/wheelchair/nurse 0 No Crutches/cane/walker 0 No Furniture 0 No Intravenous therapy Access/Saline/Heparin Lock 0 No Weak (short steps with or without shuffle, stooped but able to lift head 0 No while walking, may seek support from  furniture) Impaired (short steps with shuffle, may have difficulty arising from chair, 0 No head down, impaired balance) Mental Status Oriented to own ability 0 No Overestimates or forgets limitations 0 No Risk Level: Low Risk Score: 0 Electronic Signature(s) Signed: 06/11/2019 5:43:34 PM By: Carlene Coria RN Entered By: Carlene Coria on 06/11/2019 09:26:57 -------------------------------------------------------------------------------- Nutrition Risk Screening Details Patient Name: Date of Service: Angel Price, Angel Price 06/11/2019 9:00 AM Medical Record [id number] Patient Account Number: [id number] Date of Birth/Sex: Treating RN: 10-30-1965 (54 y.o. Orvan Falconer Primary Care Haruto Demaria: Rosita Fire D Other Clinician: Referring Shauntelle Jamerson: Treating Theador Jezewski/Extender:Robson, Barbera Setters, Tesfaye D Weeks in Treatment: 0 Height (in): 63 Weight (lbs): 143 Body Mass Index (BMI): 25.3 Nutrition Risk Screening Items Score Screening NUTRITION RISK SCREEN: I have an illness or condition that made me change the kind and/or 0 No amount of food I eat I eat fewer than two meals per day 0 No I eat few fruits and vegetables, or milk products 0 No I have three or more drinks of beer, liquor or wine almost every day 0 No I have tooth or mouth  problems that make it hard for me to eat 0 No I don't always have enough money to buy the food I need 0 No I eat alone most of the time 0 No I take three or more different prescribed or over-the-counter drugs a day 1 Yes 0 No Without wanting to, I have lost or gained 10 pounds in the last six months I am not always physically able to shop, cook and/or feed myself 0 No Nutrition Protocols Good Risk Protocol 0 No interventions needed Moderate Risk Protocol High Risk Proctocol Risk Level: Good Risk Score: 1 Electronic Signature(s) Signed: 06/11/2019 5:43:34 PM By: Carlene Coria RN Entered By: Carlene Coria on 06/11/2019 09:27:12

## 2019-06-12 NOTE — Progress Notes (Signed)
Angel, Price ([id number]) Visit Report for 06/11/2019 Allergy List Details Patient Name: Date of Service: Angel Price, Angel Price 06/11/2019 9:00 AM Medical Record [id number] Patient Account Number: [id number] Date of Birth/Sex: Treating RN: 26-Aug-1965 (54 y.o. Orvan Falconer Primary Care Jamel Holzmann: Rosita Fire D Other Clinician: Referring Edynn Gillock: Treating Patrizia Paule/Extender:Robson, Barbera Setters, Tesfaye D Weeks in Treatment: 0 Allergies Active Allergies codeine morphine Reglan Allergy Notes Electronic Signature(s) Signed: 06/11/2019 5:43:34 PM By: Carlene Coria RN Entered By: Carlene Coria on 06/11/2019 09:20:47 -------------------------------------------------------------------------------- Arrival Information Details Patient Name: Date of Service: Angel Price. 06/11/2019 9:00 AM Medical Record [id number] Patient Account Number: [id number] Date of Birth/Sex: Treating RN: 25-Aug-1965 (54 y.o. Orvan Falconer Primary Care Abdur Hoglund: Rosita Fire D Other Clinician: Referring Bryony Kaman: Treating Zimir Kittleson/Extender:Robson, Barbera Setters, Dimas Millin Weeks in Treatment: 0 Visit Information Patient Arrived: Ambulatory Arrival Time: 09:15 Accompanied By: self Transfer Assistance: None Patient Identification Verified: Yes Secondary Verification Process Completed: Yes Patient Requires Transmission-Based No Precautions: Patient Has Alerts: No Electronic Signature(s) Signed: 06/11/2019 5:43:34 PM By: Carlene Coria RN Entered By: Carlene Coria on 06/11/2019 09:18:50 -------------------------------------------------------------------------------- Clinic Level of Care Assessment Details Patient Name: Date of Service: Angel, DILLAHUNT 06/11/2019 9:00 AM Medical Record [id number] Patient Account Number: [id number] Date of Birth/Sex: Treating RN: 1966/03/14 (54 y.o. Clearnce Sorrel Primary Care Appollonia Klee: Rosita Fire D Other Clinician: Referring  Sherena Machorro: Treating Jawaun Celmer/Extender:Robson, Barbera Setters, Tesfaye D Weeks in Treatment: 0 Clinic Level of Care Assessment Items TOOL 2 Quantity Score X - Use when only an EandM is performed on the INITIAL visit 1 0 ASSESSMENTS - Nursing Assessment / Reassessment X - General Physical Exam (combine w/ comprehensive assessment (listed just below) 1 20 when performed on new pt. evals) X - Comprehensive Assessment (HX, ROS, Risk Assessments, Wounds Hx, etc.) 1 25 ASSESSMENTS - Wound and Skin Assessment / Reassessment []  - Simple Wound Assessment / Reassessment - one wound 0 X - Complex Wound Assessment / Reassessment - multiple wounds 4 5 []  - Dermatologic / Skin Assessment (not related to wound area) 0 ASSESSMENTS - Ostomy and/or Continence Assessment and Care []  - Incontinence Assessment and Management 0 []  - Ostomy Care Assessment and Management (repouching, etc.) 0 PROCESS - Coordination of Care []  - Simple Patient / Family Education for ongoing care 0 X - Complex (extensive) Patient / Family Education for ongoing care 1 20 X - Staff obtains Programmer, systems, Records, Test Results / Process Orders 1 10 []  - Staff telephones HHA, Nursing Homes / Clarify orders / etc 0 []  - Routine Transfer to another Facility (non-emergent condition) 0 []  - Routine Hospital Admission (non-emergent condition) 0 X - New Admissions / Biomedical engineer / Ordering NPWT, Apligraf, etc. 1 15 []  - Emergency Hospital Admission (emergent condition) 0 []  - Simple Discharge Coordination 0 X - Complex (extensive) Discharge Coordination 1 15 PROCESS - Special Needs []  - Pediatric / Minor Patient Management 0 []  - Isolation Patient Management 0 []  - Hearing / Language / Visual special needs 0 []  - Assessment of Community assistance (transportation, D/C planning, etc.) 0 []  - Additional assistance / Altered mentation 0 []  - Support Surface(s) Assessment (bed, cushion, seat, etc.) 0 INTERVENTIONS - Wound Cleansing  / Measurement X - Wound Imaging (photographs - any number of wounds) 1 5 []  - Wound Tracing (instead of photographs) 0 []  - Simple Wound Measurement - one wound 0 X - Complex Wound Measurement - multiple wounds 4 5 []  - Simple Wound Cleansing - one wound 0 X - Complex  Wound Cleansing - multiple wounds 4 5 INTERVENTIONS - Wound Dressings X - Small Wound Dressing one or multiple wounds 4 10 []  - Medium Wound Dressing one or multiple wounds 0 []  - Large Wound Dressing one or multiple wounds 0 []  - Application of Medications - injection 0 INTERVENTIONS - Miscellaneous []  - External ear exam 0 []  - Specimen Collection (cultures, biopsies, blood, body fluids, etc.) 0 []  - Specimen(s) / Culture(s) sent or taken to Lab for analysis 0 []  - Patient Transfer (multiple staff / Harrel Lemon Lift / Similar devices) 0 []  - Simple Staple / Suture removal (25 or less) 0 []  - Complex Staple / Suture removal (26 or more) 0 []  - Hypo / Hyperglycemic Management (close monitor of Blood Glucose) 0 []  - Ankle / Brachial Index (ABI) - do not check if billed separately 0 Has the patient been seen at the hospital within the last three years: Yes Total Score: 210 Level Of Care: New/Established - Level 5 Electronic Signature(s) Signed: 06/11/2019 5:41:24 PM By: Kela Millin Entered By: Kela Millin on 06/11/2019 10:17:37 -------------------------------------------------------------------------------- Multi Wound Chart Details Patient Name: Date of Service: Angel Price. 06/11/2019 9:00 AM Medical Record [id number] Patient Account Number: [id number] Date of Birth/Sex: Treating RN: 05-15-1965 (54 y.o. F) Primary Care Angel Price: Rosita Fire D Other Clinician: Referring Cynai Skeens: Treating Ilias Stcharles/Extender:Robson, Barbera Setters, Tesfaye D Weeks in Treatment: 0 Vital Signs Height(in): 63 Pulse(bpm): 69 Weight(lbs): 143 Blood Pressure(mmHg): 117/82 Body Mass Index(BMI): 25 Temperature(F):  98.2 Respiratory 18 Rate(breaths/min): Photos: [1:No Photos] [2:No Photos] [3:No Photos] Wound Location: [1:Right Abdomen - Upper Quadrant] [2:Proximal Abdomen - midline Abdomen - midline] Wounding Event: [1:Surgical Injury] [2:Surgical Injury] [3:Surgical Injury] Primary Etiology: [1:Atypical] [2:Atypical] [3:Atypical] Comorbid History: [1:Anemia, Hypertension, Lupus Erythematosus] [2:Anemia, Hypertension, Lupus Erythematosus] [3:Anemia, Hypertension, Lupus Erythematosus] Date Acquired: [1:05/02/2017] [2:05/02/2017] [3:05/02/2017] Weeks of Treatment: [1:0] [2:0] [3:0] Wound Status: [1:Open] [2:Open] [3:Open] Measurements L x W x D 0.2x0.2x2.2 [2:0.1x0.1x2.7] [3:0.2x0.2x1] (cm) Area (cm) : [1:0.031] [2:0.008] [3:0.031] Volume (cm) : [1:0.069] [2:0.021] [3:0.031] % Reduction in Area: [1:0.00%] [2:0.00%] [3:0.00%] % Reduction in Volume: 0.00% [2:0.00%] [3:0.00%] Classification: [1:Full Thickness Without Exposed Support Structures Exposed Support Structures Exposed Support Structures] [2:Full Thickness Without] [3:Full Thickness Without] Exudate Amount: [1:Medium] [2:Medium] [3:Medium] Exudate Type: [1:Serosanguineous] [2:Serosanguineous] [3:Serosanguineous] Exudate Color: [1:red, brown] [2:red, brown] [3:red, brown] Wound Margin: [1:Well defined, not attached Well defined, not attached N/A] Granulation Amount: [1:Large (67-100%)] [2:Large (67-100%)] [3:Large (67-100%)] Granulation Quality: [1:N/A] [2:Red] [3:Red] Necrotic Amount: [1:None Present (0%)] [2:None Present (0%)] [3:None Present (0%)] Epithelialization: [1:None] [2:None 4] [3:None N/A] Photos: [1:No Photos] [2:N/A] [3:N/A] Wound Location: [1:Left Abdomen - Lower Quadrant] [2:N/A] [3:N/A] Wounding Event: [1:Surgical Injury] [2:N/A] [3:N/A] Primary Etiology: [1:Atypical] [2:N/A] [3:N/A] Comorbid History: [1:Anemia, Hypertension, Lupus Erythematosus] [2:N/A] [3:N/A] Date Acquired: [1:05/02/2018] [2:N/A] [3:N/A] Weeks of Treatment:  [1:0] [2:N/A] [3:N/A] Wound Status: [1:Open] [2:N/A] [3:N/A] Measurements L x W x D 0.3x0.3x1.7 [2:N/A] [3:N/A] (cm) Area (cm) : [1:0.071] [2:N/A] [3:N/A] Volume (cm) : [1:0.12] [2:N/A] [3:N/A] % Reduction in Area: [1:0.00%] [2:N/A] [3:N/A] % Reduction in Volume: 0.00% [2:N/A] [3:N/A] Classification: [1:Full Thickness Without Exposed Support Structures] [2:N/A] [3:N/A] Exudate Amount: [1:Medium] [2:N/A] [3:N/A] Exudate Type: [1:Serosanguineous] [2:N/A] [3:N/A] Exudate Color: [1:red, brown] [2:N/A] [3:N/A] Wound Margin: [1:N/A] [2:N/A] [3:N/A] Granulation Amount: [1:Large (67-100%)] [2:N/A] [3:N/A] Granulation Quality: [1:Red] [2:N/A] [3:N/A] Necrotic Amount: [1:None Present (0%)] [2:N/A] [3:N/A] Exposed Structures: [1:Fat Layer (Subcutaneous N/A Tissue) Exposed: Yes Fascia: No Tendon: No Muscle: No Joint: No Bone: No None] [2:N/A] [3:N/A N/A] Treatment Notes Electronic Signature(s) Signed: 06/12/2019 8:02:09  AM By: Linton Ham MD Entered By: Linton Ham on 06/11/2019 10:17:57 -------------------------------------------------------------------------------- Multi-Disciplinary Care Plan Details Patient Name: Date of Service: JOELLY, BUTCHER 06/11/2019 9:00 AM Medical Record [id number] Patient Account Number: [id number] Date of Birth/Sex: Treating RN: 06-14-1965 (54 y.o. Clearnce Sorrel Primary Care Shaneice Barsanti: Rosita Fire D Other Clinician: Referring Jerra Huckeby: Treating Trice Aspinall/Extender:Robson, Barbera Setters, Tesfaye D Weeks in Treatment: 0 Active Inactive Electronic Signature(s) Signed: 06/11/2019 5:41:24 PM By: Kela Millin Entered By: Kela Millin on 06/11/2019 10:18:18 -------------------------------------------------------------------------------- Pain Assessment Details Patient Name: Date of Service: NEENAH, MARCHAND 06/11/2019 9:00 AM Medical Record [id number] Patient Account Number: [id number] Date of Birth/Sex: Treating  RN: 1966-01-07 (54 y.o. Orvan Falconer Primary Care Johannes Everage: Rosita Fire D Other Clinician: Referring Deejay Koppelman: Treating Astin Sayre/Extender:Robson, Barbera Setters, Tesfaye D Weeks in Treatment: 0 Active Problems Location of Pain Severity and Description of Pain Patient Has Paino Yes Site Locations With Dressing Change: Yes Duration of the Pain. Constant / Intermittento Intermittent How Long Does it Lasto Hours: 1 Minutes: Rate the pain. Current Pain Level: 4 Worst Pain Level: 7 Least Pain Level: 0 Tolerable Pain Level: 5 Character of Pain Describe the Pain: Burning, Throbbing Pain Management and Medication Current Pain Management: Medication: Yes Cold Application: No Rest: Yes Massage: No Activity: No T.E.N.S.: No Heat Application: No Leg drop or elevation: No Is the Current Pain Management Adequate: Inadequate How does your wound impact your activities of daily livingo Sleep: Yes Bathing: No Appetite: Yes Relationship With Others: No Bladder Continence: No Emotions: No Bowel Continence: No Work: No Toileting: No Drive: No Dressing: No Hobbies: No Electronic Signature(s) Signed: 06/11/2019 5:43:34 PM By: Carlene Coria RN Entered By: Carlene Coria on 06/11/2019 09:51:48 -------------------------------------------------------------------------------- Patient/Caregiver Education Details Patient Name: Date of Service: Mirabella, Alicja R. 3/26/2021andnbsp9:00 AM Medical Record [id number] Patient Account Number: [id number] Date of Birth/Gender: Treating RN: 03/13/1966 (54 y.o. Clearnce Sorrel Primary Care Physician: Rosita Fire D Other Clinician: Referring Physician: Treating Physician/Extender:Robson, Barbera Setters, Dimas Millin Weeks in Treatment: 0 Education Assessment Education Provided To: Patient Education Topics Provided Welcome To [facility]: Methods: Explain/Verbal Responses: State content correctly Wound/Skin  Impairment: Methods: Explain/Verbal Responses: State content correctly Electronic Signature(s) Signed: 06/11/2019 5:41:24 PM By: Kela Millin Entered By: Kela Millin on 06/11/2019 10:02:44 -------------------------------------------------------------------------------- Wound Assessment Details Patient Name: Date of Service: Angel Price 06/11/2019 9:00 AM Medical Record [id number] Patient Account Number: [id number] Date of Birth/Sex: Treating RN: 1965/03/26 (54 y.o. Orvan Falconer Primary Care Kylah Maresh: Rosita Fire D Other Clinician: Referring Salaam Battershell: Treating Lourie Retz/Extender:Robson, Barbera Setters, Tesfaye D Weeks in Treatment: 0 Wound Status Wound Number: 1 Primary Atypical Etiology: Wound Location: Right Abdomen - Upper Quadrant Wound Status: Open Wounding Event: Surgical Injury Comorbid Anemia, Hypertension, Lupus Date Acquired: 05/02/2017 History: Erythematosus Weeks Of Treatment: 0 Clustered Wound: No Wound Measurements Length: (cm) 0.2 % Reduct Width: (cm) 0.2 % Reduct Depth: (cm) 2.2 Epitheli Area: (cm) 0.031 Tunneli Volume: (cm) 0.069 Undermi Wound Description Classification: Full Thickness Without Exposed Support Foul Od Structures Slough/ Wound Well defined, not attached Margin: Exudate Medium Amount: Exudate Serosanguineous Type: Exudate red, brown Color: Wound Bed Granulation Amount: Large (67-100%) Necrotic Amount: None Present (0%) or After Cleansing: No Fibrino No ion in Area: 0% ion in Volume: 0% alization: None ng: No ning: No Electronic Signature(s) Signed: 06/11/2019 5:43:34 PM By: Carlene Coria RN Entered By: Carlene Coria on 06/11/2019 09:50:56 -------------------------------------------------------------------------------- Wound Assessment Details Patient Name: Date of Service: LILLYN, BRACHER 06/11/2019 9:00 AM Medical Record [id number] Patient Account  Number: [id number] Date of  Birth/Sex: Treating RN: [birth year] (54 y.o. Orvan Falconer Primary Care Zeyna Mkrtchyan: Rosita Fire D Other Clinician: Referring Kalandra Masters: Treating Marcella Dunnaway/Extender:Robson, Barbera Setters, Tesfaye D Weeks in Treatment: 0 Wound Status Wound Number: 2 Primary Atypical Etiology: Wound Location: Proximal Abdomen - midline Wound Status: Open Wounding Event: Surgical Injury Comorbid Anemia, Hypertension, Lupus Date Acquired: 05/02/2017 History: Erythematosus Weeks Of Treatment: 0 Clustered Wound: No Wound Measurements Length: (cm) 0.1 % Reduct Width: (cm) 0.1 % Reduct Depth: (cm) 2.7 Epitheli Area: (cm) 0.008 Tunneli Volume: (cm) 0.021 Undermi Wound Description Classification: Full Thickness Without Exposed Support Foul Od Structures Slough/ Wound Well defined, not attached Margin: Exudate Medium Amount: Exudate Serosanguineous Type: Exudate red, brown Color: Wound Bed Granulation Amount: Large (67-100%) Granulation Quality: Red Fascia E Necrotic Amount: None Present (0%) Fat Laye Tendon E Muscle E Joint Ex Bone Exp or After Cleansing: No Fibrino No Exposed Structure xposed: No r (Subcutaneous Tissue) Exposed: Yes xposed: No xposed: No posed: No osed: No ion in Area: 0% ion in Volume: 0% alization: None ng: No ning: No Electronic Signature(s) Signed: 06/11/2019 5:43:34 PM By: Carlene Coria RN Entered By: Carlene Coria on 06/11/2019 09:50:56 -------------------------------------------------------------------------------- Wound Assessment Details Patient Name: Date of Service: HANIFA, STILLSON 06/11/2019 9:00 AM Medical Record [id number] Patient Account Number: [id number] Date of Birth/Sex: Treating RN: 12/27/1965 (54 y.o. Orvan Falconer Primary Care Shravya Wickwire: Rosita Fire D Other Clinician: Referring Cyan Clippinger: Treating Maisy Newport/Extender:Robson, Barbera Setters, Tesfaye D Weeks in Treatment: 0 Wound Status Wound Number: 3 Primary  Atypical Etiology: Wound Location: Abdomen - midline Wound Status: Open Wounding Event: Surgical Injury Comorbid Anemia, Hypertension, Lupus Date Acquired: 05/02/2017 History: Erythematosus Weeks Of Treatment: 0 Clustered Wound: No Wound Measurements Length: (cm) 0.2 % Reduct Width: (cm) 0.2 % Reduct Depth: (cm) 1 Epitheli Area: (cm) 0.031 Tunneli Volume: (cm) 0.031 Undermi Wound Description Classification: Full Thickness Without Exposed Support Foul Od Structures Slough/ Exudate Medium Amount: Exudate Serosanguineous Type: Exudate red, brown Color: Wound Bed Granulation Amount: Large (67-100%) Granulation Quality: Red Fascia E Necrotic Amount: None Present (0%) Fat Laye Tendon E Muscle E Joint Ex Bone Exp or After Cleansing: No Fibrino No Exposed Structure xposed: No r (Subcutaneous Tissue) Exposed: Yes xposed: No xposed: No posed: No osed: No ion in Area: 0% ion in Volume: 0% alization: None ng: No ning: No Electronic Signature(s) Signed: 06/11/2019 5:43:34 PM By: Carlene Coria RN Entered By: Carlene Coria on 06/11/2019 09:50:57 -------------------------------------------------------------------------------- Wound Assessment Details Patient Name: Date of Service: COBIE, PIZER 06/11/2019 9:00 AM Medical Record [id number] Patient Account Number: [id number] Date of Birth/Sex: Treating RN: Apr 28, 1965 (54 y.o. Orvan Falconer Primary Care Donshay Lupinski: Rosita Fire D Other Clinician: Referring Brodi Kari: Treating Kyeisha Janowicz/Extender:Robson, Barbera Setters, Tesfaye D Weeks in Treatment: 0 Wound Status Wound Number: 4 Primary Atypical Etiology: Wound Location: Left Abdomen - Lower Quadrant Wound Status: Open Wounding Event: Surgical Injury Comorbid Anemia, Hypertension, Lupus Date Acquired: 05/02/2018 History: Erythematosus Weeks Of Treatment: 0 Clustered Wound: No Wound Measurements Length: (cm) 0.3 % Reduct Width: (cm) 0.3 % Reduct Depth:  (cm) 1.7 Epitheli Area: (cm) 0.071 Tunneli Volume: (cm) 0.12 Undermi Wound Description Classification: Full Thickness Without Exposed Support Foul Od Structures Slough/ Exudate Exudate Medium Amount: Exudate Serosanguineous Type: Exudate red, brown Color: Wound Bed Granulation Amount: Large (67-100%) Granulation Quality: Red Fascia Ex Necrotic Amount: None Present (0%) Fat Layer Tendon Ex Muscle Ex Joint Exp Bone Expo or After Cleansing: No Fibrino No Exposed Structure posed: No (Subcutaneous Tissue) Exposed: Yes posed: No  posed: No osed: No sed: No ion in Area: 0% ion in Volume: 0% alization: None ng: No ning: No Electronic Signature(s) Signed: 06/11/2019 5:43:34 PM By: Carlene Coria RN Entered By: Carlene Coria on 06/11/2019 09:50:57 -------------------------------------------------------------------------------- Vitals Details Patient Name: Date of Service: Angel Price. 06/11/2019 9:00 AM Medical Record [id number] Patient Account Number: [id number] Date of Birth/Sex: Treating RN: 1965/09/10 (54 y.o. Orvan Falconer Primary Care Sandrine Bloodsworth: Rosita Fire D Other Clinician: Referring Conlan Miceli: Treating Nakiea Metzner/Extender:Robson, Barbera Setters, Tesfaye D Weeks in Treatment: 0 Vital Signs Time Taken: 09:18 Temperature (F): 98.2 Height (in): 63 Pulse (bpm): 66 Source: Stated Respiratory Rate (breaths/min): 18 Weight (lbs): 143 Blood Pressure (mmHg): 117/82 Source: Stated Reference Range: 80 - 120 mg / dl Body Mass Index (BMI): 25.3 Electronic Signature(s) Signed: 06/11/2019 5:43:34 PM By: Carlene Coria RN Entered By: Carlene Coria on 06/11/2019 09:19:55

## 2019-06-12 NOTE — Progress Notes (Signed)
Angel Price, Angel Price ([id number]) Visit Report for 06/11/2019 Chief Complaint Document Details Patient Name: Date of Service: Angel Price, Angel Price 06/11/2019 9:00 AM Medical Record [id number] Patient Account Number: [id number] Date of Birth/Sex: Treating RN: 09-15-1965 (54 y.o. F) Primary Care Provider: Rosita Fire D Other Clinician: Referring Provider: Treating Provider/Extender:Robson, Barbera Setters, Tesfaye D Weeks in Treatment: 0 Information Obtained from: Patient Chief Complaint 06/11/2019; this is a patient who is referred for areas on her abdomen I believe by the emergency room. Electronic Signature(s) Signed: 06/12/2019 8:02:09 AM By: Linton Ham MD Entered By: Linton Ham on 06/11/2019 10:19:26 -------------------------------------------------------------------------------- HPI Details Patient Name: Date of Service: Angel Price. 06/11/2019 9:00 AM Medical Record [id number] Patient Account Number: [id number] Date of Birth/Sex: Treating RN: 07/06/1965 (54 y.o. F) Primary Care Provider: Peterson Ao Other Clinician: Referring Provider: Treating Provider/Extender:Robson, Barbera Setters, Tesfaye D Weeks in Treatment: 0 History of Present Illness HPI Description: ADMISSION 06/11/2019 This is a patient who is 54 years old she is a type II diabetic. 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 Subsequently she relates in 2015 she developed an abscess in the right upper quadrant of her abdomen. She went to the OR with Dr. Arnoldo Morale in [place] in October 2018 for drainage of abdominal wall wounds that started a week ago. A CT scan showed worsening abdominal wall cellulitis. Looking at her records from more recently she was admitted to  hospital from 12/8 through 12/11 with abdominal wall cellulitis. She was back in the ER on 05/12/2019 with chronic abdominal wall abscesses and abdominal pain. She tells me that Dr. Arnoldo Morale tried to refer her to [facility] about a year ago but she never kept the appointment. She currently has 4 draining areas on her abdomen The last CT scan I saw was from December 2018. I believe at that point she was being worked up for a possible SBO. It was noted that she had surgical changes of a gastrojejunostomy postsurgical change. Small Gollan small bowel anastomosis were present small bowel loops were opposed to the ventral abdominal wall reflecting adhesions Past medical history includes a Billroth II surgery, Roux-en-Y anastomosis, history of abdominal wall abscesses gastroesophageal reflux disease gastroparesis recurrent abdominal wall cellulitis, type 2 diabetes chronic abdominal pain history of pancreatitis history of substance abuse and schizoaffective disorder. Electronic Signature(s) Signed: 06/12/2019 8:02:09 AM By: Linton Ham MD Entered By: Linton Ham on 06/11/2019 10:24:33 -------------------------------------------------------------------------------- Physical Exam Details Patient Name: Date of Service: Angel Price 06/11/2019 9:00 AM Medical Record [id number] Patient Account Number: [id number] Date of Birth/Sex: Treating RN: [birth year] (54 y.o. F) Primary Care Provider: Peterson Ao Other Clinician: Referring Provider: Treating Provider/Extender:Robson, Barbera Setters, Tesfaye D Weeks in Treatment: 0 Constitutional Sitting or standing Blood Pressure is within target range for patient.. Pulse regular and within target range for patient.Marland Kitchen Respirations regular, non-labored and within target range.. Temperature is normal and within the target range for the patient.Marland Kitchen Appears in no distress. Eyes No scleral icterus. Respiratory work of breathing is  normal. Cardiovascular Heart rhythm and rate regular, without murmur or gallop.. Gastrointestinal (GI) What appears to be multiple midline surgical scars. Tender in the epigastrium and right upper quadrant but no guarding or rebound. No liver or spleen enlargement. Psychiatric Very pleasant and cooperative.. 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 One in the right upper quadrant 2 in the midline lower abdomen and 1 in the left mid abdominal area. These apparently drained but I did not see any purulent drainage or drainage at all from these and palpate with palpation. She is however tender Electronic Signature(s) Signed: 06/12/2019 8:02:09 AM By: Linton Ham MD Entered By: Linton Ham on 06/11/2019 10:25:56 -------------------------------------------------------------------------------- Physician Orders Details Patient Name: Date of Service: Angel Price 06/11/2019 9:00 AM Medical Record [id number] Patient Account Number: [id number] Date of Birth/Sex: Treating RN: 1966-01-18 (54 y.o. Clearnce Sorrel Primary Care Provider: Rosita Fire D Other Clinician: Referring Provider: Treating Provider/Extender:Robson, Barbera Setters, Tesfaye D Weeks in Treatment: 0 Verbal / Phone Orders: No Diagnosis Coding Discharge From [facility] Services Discharge from [place] - To See General Surgeon at [facility] Wound Dressing Wound #1 Right Abdomen - Upper Quadrant May substitute equivalent product for dressing - Cleanse with wound cleanse, apply 4x4, ABD, and tape Wound #2 Proximal Abdomen - midline May substitute equivalent product for dressing - Cleanse with wound cleanse, apply 4x4, ABD, and tape Wound #3 Abdomen - midline May substitute equivalent product for dressing - Cleanse with wound cleanse, apply 4x4, ABD, and tape Wound #4 Left Abdomen - Lower Quadrant May substitute equivalent product for dressing - Cleanse with  wound cleanse, apply 4x4, ABD, and tape [employer] Surgical Consult - General Surgical Consult at [facility], Fistulas to abdomen Electronic Signature(s) Signed: 06/11/2019 5:41:24 PM By: Kela Millin Signed: 06/12/2019 8:02:09 AM By: Linton Ham MD Entered By: Kela Millin on 06/11/2019 10:11:29 -------------------------------------------------------------------------------- Prescription 06/11/2019 Patient Name: [name] R. Provider: Linton Ham MD Date of Birth: 03-14-1966 NPI#: [id number] Sex: F DEA#: [id number] Phone #: [phone] License #: [id number] Patient Address: [address], [place] [address] [place], [place] [address] [phone] Allergies codeine morphine Reglan Provider's Orders Surgical Consult - General Surgical Consult at [facility], Fistulas to abdomen Signature(s): Date(s): Electronic Signature(s) Signed: 06/11/2019 5:41:24 PM By: Kela Millin Signed: 06/12/2019 8:02:09 AM By: Linton Ham MD Entered By: Kela Millin on 06/11/2019 10:11:29 --------------------------------------------------------------------------------  Problem List Details Patient Name: Date of Service: Angel Price. 06/11/2019 9:00 AM Medical Record [id number] Patient Account Number: [id number] Date of Birth/Sex: Treating RN: 1966/02/03 (54 y.o. F) Primary Care Provider: Peterson Ao Other Clinician: Referring Provider: Treating Provider/Extender:Robson, Barbera Setters, Tesfaye D Weeks in Treatment: 0 Active Problems ICD-10 Evaluated Encounter Code Description Active Date Today Diagnosis L02.211 Cutaneous abscess of abdominal wall 06/11/2019 No Yes Inactive Problems Resolved Problems Electronic Signature(s) Signed: 06/12/2019 8:02:09 AM By: Linton Ham MD Entered By: Linton Ham on 06/11/2019  10:17:44 -------------------------------------------------------------------------------- Progress Note Details Patient Name: Date of Service: Angel Price. 06/11/2019 9:00 AM Medical Record [id number] Patient Account Number: [id number] Date of Birth/Sex: Treating RN: [birth year] (54 y.o. F) Primary Care Provider: Rosita Fire D Other Clinician: Referring Provider: Treating Provider/Extender:Robson, Barbera Setters, Tesfaye D Weeks in Treatment: 0 Subjective Chief Complaint Information obtained from Patient 06/11/2019; this is a patient who is referred for areas on her abdomen I believe by the emergency room. History of Present Illness (HPI) ADMISSION 06/11/2019 This is a patient who is 54 years old she is a type II diabetic. 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 The patient relates her history back  to the early 2000 she apparently had a Billroth II gastrectomy and procedure for gastric ulcers. Subsequently she relates in 2015 she developed an abscess in the right upper quadrant of her abdomen. She went to the OR with Dr. Arnoldo Morale in [place] in October 2018 for drainage of abdominal wall wounds that started a week ago. A CT scan showed worsening abdominal wall cellulitis. Looking at her records from more recently she was admitted to hospital from 12/8 through 12/11 with abdominal wall cellulitis. She was back in the ER on 05/12/2019 with chronic abdominal wall abscesses and abdominal pain. She tells me that Dr. Arnoldo Morale tried to refer her to [facility] about a year ago but she never kept the appointment. She currently has 4 draining areas on her abdomen The last CT scan I saw was from December 2018. I believe at that point she was being worked up for a possible SBO. It was noted that she had surgical changes of a gastrojejunostomy postsurgical change. Small Gollan small bowel  anastomosis were present small bowel loops were opposed to the ventral abdominal wall reflecting adhesions Past medical history includes a Billroth II surgery, Roux-en-Y anastomosis, history of abdominal wall abscesses gastroesophageal reflux disease gastroparesis recurrent abdominal wall cellulitis, type 2 diabetes chronic abdominal pain history of pancreatitis history of substance abuse and schizoaffective disorder. Patient History Information obtained from Patient. Allergies codeine, morphine, Reglan Family History Cancer - Paternal Grandparents,Maternal Grandparents,Mother,Father, Diabetes - Mother, Heart Disease - Mother, Hypertension - Mother, Kidney Disease - Mother, Lung Disease - Mother, Stroke - Maternal Grandparents,Mother, No family history of Hereditary Spherocytosis, Seizures, Thyroid Problems, Tuberculosis. Social History Current every day smoker, Marital Status - Divorced, Alcohol Use - Never - stopped in march, Drug Use - No History - stopped in march, Caffeine Use - Rarely. Medical History Eyes Denies history of Cataracts, Glaucoma, Optic Neuritis Ear/Nose/Mouth/Throat Denies history of Chronic sinus problems/congestion, Middle ear problems Hematologic/Lymphatic Patient has history of Anemia Denies history of Hemophilia, Human Immunodeficiency Virus, Lymphedema, Sickle Cell Disease Respiratory Denies history of Aspiration, Asthma, Chronic Obstructive Pulmonary Disease (COPD), Pneumothorax, Sleep Apnea, Tuberculosis Cardiovascular Patient has history of Hypertension Denies history of Angina, Arrhythmia, Congestive Heart Failure, Coronary Artery Disease, Deep Vein Thrombosis, Hypotension, Myocardial Infarction, Peripheral Arterial Disease, Peripheral Venous Disease, Phlebitis, Vasculitis Gastrointestinal Denies history of Cirrhosis , Colitis, Crohnoos, Hepatitis A, Hepatitis B, Hepatitis C Endocrine Denies history of Type I Diabetes, Type II  Diabetes Genitourinary Denies history of End Stage Renal Disease Immunological Patient has history of Lupus Erythematosus Denies history of Raynaudoos, Scleroderma Integumentary (Skin) Denies history of History of Burn Musculoskeletal Denies history of Gout, Rheumatoid Arthritis, Osteoarthritis, Osteomyelitis Neurologic Denies history of Dementia, Neuropathy, Quadriplegia, Paraplegia, Seizure Disorder Oncologic Denies history of Received Chemotherapy, Received Radiation Psychiatric Denies history of Anorexia/bulimia, Confinement Anxiety Review of Systems (ROS) Constitutional Symptoms (General Health) Denies complaints or symptoms of Fatigue, Fever, Chills, Marked Weight Change. Eyes Complains or has symptoms of Glasses / Contacts - glasses. Denies complaints or symptoms of Dry Eyes, Vision Changes. Ear/Nose/Mouth/Throat Denies complaints or symptoms of Chronic sinus problems or rhinitis. Respiratory Denies complaints or symptoms of Chronic or frequent coughs, Shortness of Breath. Cardiovascular Denies complaints or symptoms of Chest pain. Gastrointestinal Denies complaints or symptoms of Frequent diarrhea, Nausea, Vomiting. Endocrine Denies complaints or symptoms of Heat/cold intolerance. Genitourinary Denies complaints or symptoms of Frequent urination. Integumentary (Skin) Complains or has symptoms of Wounds. Musculoskeletal Denies complaints or symptoms of Muscle Pain, Muscle Weakness. Neurologic Denies complaints or symptoms  of Numbness/parasthesias. Psychiatric Denies complaints or symptoms of Claustrophobia, Suicidal. Objective Constitutional Sitting or standing Blood Pressure is within target range for patient.. Pulse regular and within target range for patient.Marland Kitchen Respirations regular, non-labored and within target range.. Temperature is normal and within the target range for the patient.Marland Kitchen Appears in no distress. Vitals Time Taken: 9:18 AM, Height: 63 in, Source:  Stated, Weight: 143 lbs, Source: Stated, BMI: 25.3, Temperature: 98.2 F, Pulse: 66 bpm, Respiratory Rate: 18 breaths/min, Blood Pressure: 117/82 mmHg. Eyes No scleral icterus. Respiratory work of breathing is normal. Cardiovascular Heart rhythm and rate regular, without murmur or gallop.. Gastrointestinal (GI) What appears to be multiple midline surgical scars. Tender in the epigastrium and right upper quadrant but no guarding or rebound. No liver or spleen enlargement. Psychiatric Very pleasant and cooperative.. 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 One in the right upper quadrant 2 in the midline lower abdomen and 1 in the left mid abdominal area. These apparently drained but I did not see any purulent drainage or drainage at all from these and palpate with palpation. She is however tender Integumentary (Hair, Skin) Wound #1 status is Open. Original cause of wound was Surgical Injury. The wound is located on the Right Abdomen - Upper Quadrant. The wound measures 0.2cm length x 0.2cm width x 2.2cm depth; 0.031cm^2 area and 0.069cm^3 volume. There is no tunneling or undermining noted. There is a medium amount of serosanguineous drainage noted. The wound margin is well defined and not attached to the wound base. There is large (67-100%) granulation within the wound bed. There is no necrotic tissue within the wound bed. Wound #2 status is Open. Original cause of wound was Surgical Injury. The wound is located on the Proximal Abdomen - midline. The wound measures 0.1cm length x 0.1cm width x 2.7cm depth; 0.008cm^2 area and 0.021cm^3 volume. There is Fat Layer (Subcutaneous Tissue) Exposed exposed. There is no tunneling or undermining noted. There is a medium amount of serosanguineous drainage noted. The wound margin is well defined and not attached to the wound base. There is large (67-100%) red granulation within the wound bed. There is no  necrotic tissue within the wound bed. Wound #3 status is Open. Original cause of wound was Surgical Injury. The wound is located on the Abdomen - midline. The wound measures 0.2cm length x 0.2cm width x 1cm depth; 0.031cm^2 area and 0.031cm^3 volume. There is Fat Layer (Subcutaneous Tissue) Exposed exposed. There is no tunneling or undermining noted. There is a medium amount of serosanguineous drainage noted. There is large (67-100%) red granulation within the wound bed. There is no necrotic tissue within the wound bed. Wound #4 status is Open. Original cause of wound was Surgical Injury. The wound is located on the Left Abdomen - Lower Quadrant. The wound measures 0.3cm length x 0.3cm width x 1.7cm depth; 0.071cm^2 area and 0.12cm^3 volume. There is Fat Layer (Subcutaneous Tissue) Exposed exposed. There is no tunneling or undermining noted. There is a medium amount of serosanguineous drainage noted. There is large (67-100%) red granulation within the wound bed. There is no necrotic tissue within the wound bed. Assessment Active Problems ICD-10 Cutaneous abscess of abdominal wall Plan Discharge From [facility] Services: Discharge from [place] - To See General Surgeon at [facility] Wound Dressing: Wound #1 Right Abdomen - Upper Quadrant: May substitute equivalent product for dressing - Cleanse with wound cleanse, apply 4x4, ABD, and tape Wound #2 Proximal Abdomen -  midline: May substitute equivalent product for dressing - Cleanse with wound cleanse, apply 4x4, ABD, and tape Wound #3 Abdomen - midline: May substitute equivalent product for dressing - Cleanse with wound cleanse, apply 4x4, ABD, and tape Wound #4 Left Abdomen - Lower Quadrant: May substitute equivalent product for dressing - Cleanse with wound cleanse, apply 4x4, ABD, and tape ordered were: Surgical Consult - General Surgical Consult at [facility], Fistulas to abdomen 1. For small fistula tracts. The patient is simply  treating this with dry gauze and tape changing daily at this point I would there is nothing from that point of view that I could add to this 2. 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 There was no mention of abscesses at the time. 3. Although the patient is not acutely ill, this is really an overwhelming problem. One would have to wonder if there would be a surgical procedure to try and relieve this problem. If so it would have to be done at an academic [facility]. I will try to refer her to general surgery at [facility] which is closer for the patient. Although she has a long list of issues she was calm and cooperative in the clinic here and was anxious to see if anything can be done to help her. 4. There is certainly nothing we can do to help her here I spent 40 minutes mostly reviewing old records on this patient but also face-to-face evaluation and preparation of this record Electronic Signature(s) Signed: 06/12/2019 8:02:09 AM By: Linton Ham MD Entered By: Linton Ham on 06/11/2019 10:29:06 -------------------------------------------------------------------------------- HxROS Details Patient Name: Date of Service: Angel Price. 06/11/2019 9:00 AM Medical Record [id number] Patient Account Number: [id number] Date of Birth/Sex: Treating RN: 11-Feb-1966 (54 y.o. Orvan Falconer Primary Care Provider: Rosita Fire D Other Clinician: Referring Provider: Treating Provider/Extender:Robson, Barbera Setters, Tesfaye D Weeks in Treatment: 0 Information Obtained From Patient Constitutional Symptoms (General Health) Complaints and Symptoms: Negative for: Fatigue; Fever; Chills; Marked Weight Change Eyes Complaints and Symptoms: Positive for: Glasses / Contacts - glasses Negative for: Dry Eyes; Vision Changes Medical History: Negative for: Cataracts;  Glaucoma; Optic Neuritis Ear/Nose/Mouth/Throat Complaints and Symptoms: Negative for: Chronic sinus problems or rhinitis Medical History: Negative for: Chronic sinus problems/congestion; Middle ear problems Respiratory Complaints and Symptoms: Negative for: Chronic or frequent coughs; Shortness of Breath Medical History: Negative for: Aspiration; Asthma; Chronic Obstructive Pulmonary Disease (COPD); Pneumothorax; Sleep Apnea; Tuberculosis Cardiovascular Complaints and Symptoms: Negative for: Chest pain Medical History: Positive for: Hypertension Negative for: Angina; Arrhythmia; Congestive Heart Failure; Coronary Artery Disease; Deep Vein Thrombosis; Hypotension; Myocardial Infarction; Peripheral Arterial Disease; Peripheral Venous Disease; Phlebitis; Vasculitis Gastrointestinal Complaints and Symptoms: Negative for: Frequent diarrhea; Nausea; Vomiting Medical History: Negative for: Cirrhosis ; Colitis; Crohns; Hepatitis A; Hepatitis B; Hepatitis C Endocrine Complaints and Symptoms: Negative for: Heat/cold intolerance Medical History: Negative for: Type I Diabetes; Type II Diabetes Genitourinary Complaints and Symptoms: Negative for: Frequent urination Medical History: Negative for: End Stage Renal Disease Integumentary (Skin) Complaints and Symptoms: Positive for: Wounds Medical History: Negative for: History of Burn Musculoskeletal Complaints and Symptoms: Negative for: Muscle Pain; Muscle Weakness Medical History: Negative for: Gout; Rheumatoid Arthritis; Osteoarthritis; Osteomyelitis Neurologic Complaints and Symptoms: Negative for: Numbness/parasthesias Medical History: Negative for: Dementia; Neuropathy; Quadriplegia; Paraplegia; Seizure Disorder Psychiatric Complaints and Symptoms: Negative for: Claustrophobia; Suicidal Medical History: Negative for: Anorexia/bulimia; Confinement Anxiety Hematologic/Lymphatic Medical History: Positive for:  Anemia Negative for: Hemophilia; Human Immunodeficiency Virus; Lymphedema; Sickle Cell Disease Immunological Medical History: Positive for: Lupus Erythematosus Negative for: Raynauds; Scleroderma Oncologic Medical History: Negative for: Received Chemotherapy; Received Radiation Immunizations Pneumococcal Vaccine: Received Pneumococcal Vaccination: No Implantable Devices None Family and Social History Cancer: Yes - Paternal Higher education careers adviser; Diabetes: Yes - Mother; Heart Disease: Yes - Mother; Hereditary Spherocytosis: No; Hypertension: Yes - Mother; Kidney Disease: Yes - Mother; Lung Disease: Yes - Mother; Seizures: No; Stroke: Yes - Maternal Grandparents,Mother; Thyroid Problems: No; Tuberculosis: No; Current every day smoker; Marital Status - Divorced; Alcohol Use: Never - stopped in march; Drug Use: No History - stopped in march; Caffeine Use: Rarely; Financial Concerns: No; Food, Clothing or Shelter Needs: No; Support System Lacking: No; Transportation Concerns: No Electronic Signature(s) Signed: 06/11/2019 5:43:34 PM By: Carlene Coria RN Signed: 06/12/2019 8:02:09 AM By: Linton Ham MD Entered By: Carlene Coria on 06/11/2019 09:25:31 -------------------------------------------------------------------------------- SuperBill Details Patient Name: Date of Service: Angel Price 06/11/2019 Medical Record [id number] Patient Account Number: [id number] Date of Birth/Sex: Treating RN: July 10, 1965 (54 y.o. Clearnce Sorrel Primary Care Provider: Rosita Fire D Other Clinician: Referring Provider: Treating Provider/Extender:Robson, Barbera Setters, Tesfaye D Weeks in Treatment: 0 Diagnosis Coding ICD-10 Codes Code Description L02.211 Cutaneous abscess of abdominal wall Facility Procedures CPT4 Code: [id number] Description: [phone] - WOUND CARE VISIT-LEV 5 EST PT Modifier: Quantity: 1 Physician Procedures CPT4 Code:  [id number] Description: WC PHYS LEVEL 3 NEW PT ICD-10 Diagnosis Description L02.211 Cutaneous abscess of abdominal wall Modifier: Quantity: 1 Electronic Signature(s) Signed: 06/12/2019 8:02:09 AM By: Linton Ham MD Entered By: Linton Ham on 06/11/2019 10:29:20

## 2019-06-14 ENCOUNTER — Ambulatory Visit: Payer: Medicare Other

## 2019-06-14 DIAGNOSIS — I2510 Atherosclerotic heart disease of native coronary artery without angina pectoris: Secondary | ICD-10-CM

## 2019-06-15 ENCOUNTER — Emergency Department (HOSPITAL_COMMUNITY): Payer: Medicare Other

## 2019-06-15 ENCOUNTER — Encounter (HOSPITAL_COMMUNITY): Payer: Self-pay

## 2019-06-15 ENCOUNTER — Other Ambulatory Visit: Payer: Self-pay

## 2019-06-15 ENCOUNTER — Observation Stay (HOSPITAL_COMMUNITY)
Admission: EM | Admit: 2019-06-15 | Discharge: 2019-06-16 | Disposition: A | Discharge status: Home Health | Payer: Medicare Other | Attending: Internal Medicine | Admitting: Internal Medicine

## 2019-06-15 DIAGNOSIS — Z8711 Personal history of peptic ulcer disease: Secondary | ICD-10-CM | POA: Insufficient documentation

## 2019-06-15 DIAGNOSIS — I700 Atherosclerosis of aorta: Secondary | ICD-10-CM | POA: Diagnosis not present

## 2019-06-15 DIAGNOSIS — G8929 Other chronic pain: Secondary | ICD-10-CM | POA: Insufficient documentation

## 2019-06-15 DIAGNOSIS — Z8489 Family history of other specified conditions: Secondary | ICD-10-CM | POA: Insufficient documentation

## 2019-06-15 DIAGNOSIS — R110 Nausea: Secondary | ICD-10-CM | POA: Insufficient documentation

## 2019-06-15 DIAGNOSIS — I2089 Other forms of angina pectoris: Secondary | ICD-10-CM | POA: Diagnosis present

## 2019-06-15 DIAGNOSIS — E0781 Sick-euthyroid syndrome: Secondary | ICD-10-CM

## 2019-06-15 DIAGNOSIS — D649 Anemia, unspecified: Secondary | ICD-10-CM | POA: Insufficient documentation

## 2019-06-15 DIAGNOSIS — F1010 Alcohol abuse, uncomplicated: Secondary | ICD-10-CM | POA: Diagnosis present

## 2019-06-15 DIAGNOSIS — Z903 Acquired absence of stomach [part of]: Secondary | ICD-10-CM | POA: Insufficient documentation

## 2019-06-15 DIAGNOSIS — F329 Major depressive disorder, single episode, unspecified: Secondary | ICD-10-CM | POA: Insufficient documentation

## 2019-06-15 DIAGNOSIS — R0789 Other chest pain: Secondary | ICD-10-CM | POA: Diagnosis not present

## 2019-06-15 DIAGNOSIS — E876 Hypokalemia: Secondary | ICD-10-CM | POA: Diagnosis present

## 2019-06-15 DIAGNOSIS — F17210 Nicotine dependence, cigarettes, uncomplicated: Secondary | ICD-10-CM | POA: Insufficient documentation

## 2019-06-15 DIAGNOSIS — Z791 Long term (current) use of non-steroidal anti-inflammatories (NSAID): Secondary | ICD-10-CM | POA: Insufficient documentation

## 2019-06-15 DIAGNOSIS — F10129 Alcohol abuse with intoxication, unspecified: Secondary | ICD-10-CM | POA: Diagnosis not present

## 2019-06-15 DIAGNOSIS — I208 Other forms of angina pectoris: Secondary | ICD-10-CM | POA: Diagnosis present

## 2019-06-15 DIAGNOSIS — M542 Cervicalgia: Secondary | ICD-10-CM | POA: Insufficient documentation

## 2019-06-15 DIAGNOSIS — I2510 Atherosclerotic heart disease of native coronary artery without angina pectoris: Secondary | ICD-10-CM | POA: Diagnosis not present

## 2019-06-15 DIAGNOSIS — Z86010 Personal history of colonic polyps: Secondary | ICD-10-CM | POA: Diagnosis not present

## 2019-06-15 DIAGNOSIS — K5730 Diverticulosis of large intestine without perforation or abscess without bleeding: Secondary | ICD-10-CM | POA: Insufficient documentation

## 2019-06-15 DIAGNOSIS — I10 Essential (primary) hypertension: Secondary | ICD-10-CM | POA: Diagnosis not present

## 2019-06-15 DIAGNOSIS — R079 Chest pain, unspecified: Secondary | ICD-10-CM | POA: Insufficient documentation

## 2019-06-15 DIAGNOSIS — R404 Transient alteration of awareness: Secondary | ICD-10-CM | POA: Diagnosis not present

## 2019-06-15 DIAGNOSIS — R531 Weakness: Secondary | ICD-10-CM | POA: Diagnosis not present

## 2019-06-15 DIAGNOSIS — D573 Sickle-cell trait: Secondary | ICD-10-CM | POA: Diagnosis not present

## 2019-06-15 DIAGNOSIS — Z9049 Acquired absence of other specified parts of digestive tract: Secondary | ICD-10-CM | POA: Insufficient documentation

## 2019-06-15 DIAGNOSIS — Z90710 Acquired absence of both cervix and uterus: Secondary | ICD-10-CM | POA: Insufficient documentation

## 2019-06-15 DIAGNOSIS — Z800 Family history of malignant neoplasm of digestive organs: Secondary | ICD-10-CM | POA: Insufficient documentation

## 2019-06-15 DIAGNOSIS — R0689 Other abnormalities of breathing: Secondary | ICD-10-CM | POA: Diagnosis not present

## 2019-06-15 DIAGNOSIS — R7989 Other specified abnormal findings of blood chemistry: Secondary | ICD-10-CM | POA: Diagnosis not present

## 2019-06-15 DIAGNOSIS — F419 Anxiety disorder, unspecified: Secondary | ICD-10-CM | POA: Diagnosis not present

## 2019-06-15 DIAGNOSIS — Z7952 Long term (current) use of systemic steroids: Secondary | ICD-10-CM | POA: Insufficient documentation

## 2019-06-15 DIAGNOSIS — R4020 Unspecified coma: Secondary | ICD-10-CM | POA: Diagnosis not present

## 2019-06-15 DIAGNOSIS — R0602 Shortness of breath: Secondary | ICD-10-CM

## 2019-06-15 DIAGNOSIS — Z808 Family history of malignant neoplasm of other organs or systems: Secondary | ICD-10-CM | POA: Insufficient documentation

## 2019-06-15 DIAGNOSIS — Z8616 Personal history of COVID-19: Secondary | ICD-10-CM | POA: Diagnosis not present

## 2019-06-15 DIAGNOSIS — R109 Unspecified abdominal pain: Secondary | ICD-10-CM | POA: Insufficient documentation

## 2019-06-15 DIAGNOSIS — Z888 Allergy status to other drugs, medicaments and biological substances status: Secondary | ICD-10-CM | POA: Insufficient documentation

## 2019-06-15 DIAGNOSIS — R29818 Other symptoms and signs involving the nervous system: Secondary | ICD-10-CM | POA: Diagnosis not present

## 2019-06-15 DIAGNOSIS — Z79899 Other long term (current) drug therapy: Secondary | ICD-10-CM | POA: Insufficient documentation

## 2019-06-15 DIAGNOSIS — Z885 Allergy status to narcotic agent status: Secondary | ICD-10-CM | POA: Insufficient documentation

## 2019-06-15 DIAGNOSIS — I469 Cardiac arrest, cause unspecified: Secondary | ICD-10-CM | POA: Diagnosis not present

## 2019-06-15 DIAGNOSIS — Z720 Tobacco use: Secondary | ICD-10-CM | POA: Diagnosis present

## 2019-06-15 DIAGNOSIS — M329 Systemic lupus erythematosus, unspecified: Secondary | ICD-10-CM | POA: Insufficient documentation

## 2019-06-15 DIAGNOSIS — K219 Gastro-esophageal reflux disease without esophagitis: Secondary | ICD-10-CM | POA: Diagnosis not present

## 2019-06-15 DIAGNOSIS — I209 Angina pectoris, unspecified: Secondary | ICD-10-CM | POA: Diagnosis present

## 2019-06-15 DIAGNOSIS — F17200 Nicotine dependence, unspecified, uncomplicated: Secondary | ICD-10-CM | POA: Diagnosis present

## 2019-06-15 DIAGNOSIS — Z7982 Long term (current) use of aspirin: Secondary | ICD-10-CM | POA: Insufficient documentation

## 2019-06-15 DIAGNOSIS — Y906 Blood alcohol level of 120-199 mg/100 ml: Secondary | ICD-10-CM | POA: Insufficient documentation

## 2019-06-15 DIAGNOSIS — R7401 Elevation of levels of liver transaminase levels: Secondary | ICD-10-CM | POA: Diagnosis not present

## 2019-06-15 DIAGNOSIS — Z803 Family history of malignant neoplasm of breast: Secondary | ICD-10-CM | POA: Insufficient documentation

## 2019-06-15 DIAGNOSIS — R0603 Acute respiratory distress: Secondary | ICD-10-CM | POA: Diagnosis not present

## 2019-06-15 DIAGNOSIS — Z801 Family history of malignant neoplasm of trachea, bronchus and lung: Secondary | ICD-10-CM | POA: Insufficient documentation

## 2019-06-15 DIAGNOSIS — Z818 Family history of other mental and behavioral disorders: Secondary | ICD-10-CM | POA: Insufficient documentation

## 2019-06-15 DIAGNOSIS — Z8719 Personal history of other diseases of the digestive system: Secondary | ICD-10-CM | POA: Insufficient documentation

## 2019-06-15 LAB — D-DIMER, QUANTITATIVE: D-Dimer, Quant: 2.92 ug/mL-FEU — ABNORMAL HIGH (ref 0.00–0.50)

## 2019-06-15 LAB — URINALYSIS, ROUTINE W REFLEX MICROSCOPIC
Bilirubin Urine: NEGATIVE
Glucose, UA: NEGATIVE mg/dL
Hgb urine dipstick: NEGATIVE
Ketones, ur: NEGATIVE mg/dL
Leukocytes,Ua: NEGATIVE
Nitrite: NEGATIVE
Protein, ur: NEGATIVE mg/dL
Specific Gravity, Urine: 1.005 (ref 1.005–1.030)
pH: 6 (ref 5.0–8.0)

## 2019-06-15 LAB — RAPID URINE DRUG SCREEN, HOSP PERFORMED
Amphetamines: NOT DETECTED
Barbiturates: NOT DETECTED
Benzodiazepines: NOT DETECTED
Cocaine: NOT DETECTED
Opiates: NOT DETECTED
Tetrahydrocannabinol: NOT DETECTED

## 2019-06-15 LAB — BASIC METABOLIC PANEL
Anion gap: 10 (ref 5–15)
BUN: 9 mg/dL (ref 6–20)
CO2: 23 mmol/L (ref 22–32)
Calcium: 9.2 mg/dL (ref 8.9–10.3)
Chloride: 107 mmol/L (ref 98–111)
Creatinine, Ser: 0.42 mg/dL — ABNORMAL LOW (ref 0.44–1.00)
GFR calc Af Amer: >60 mL/min (ref >60)
GFR calc non Af Amer: >60 mL/min (ref >60)
Glucose, Bld: 96 mg/dL (ref 70–99)
Potassium: 3.4 mmol/L — ABNORMAL LOW (ref 3.5–5.1)
Sodium: 140 mmol/L (ref 135–145)

## 2019-06-15 LAB — HEPATIC FUNCTION PANEL
ALT: 42 U/L (ref 0–44)
AST: 93 U/L — ABNORMAL HIGH (ref 15–41)
Albumin: 3.3 g/dL — ABNORMAL LOW (ref 3.5–5.0)
Alkaline Phosphatase: 65 U/L (ref 38–126)
Bilirubin, Direct: <0.1 mg/dL (ref 0.0–0.2)
Total Bilirubin: 0.3 mg/dL (ref 0.3–1.2)
Total Protein: 6.6 g/dL (ref 6.5–8.1)

## 2019-06-15 LAB — PROTIME-INR
INR: 1.1 (ref 0.8–1.2)
Prothrombin Time: 13.8 seconds (ref 11.4–15.2)

## 2019-06-15 LAB — CBC
HCT: 33.4 % — ABNORMAL LOW (ref 36.0–46.0)
Hemoglobin: 11.7 g/dL — ABNORMAL LOW (ref 12.0–15.0)
MCH: 28.3 pg (ref 26.0–34.0)
MCHC: 35 g/dL (ref 30.0–36.0)
MCV: 80.9 fL (ref 80.0–100.0)
Platelets: 317 10*3/uL (ref 150–400)
RBC: 4.13 MIL/uL (ref 3.87–5.11)
RDW: 14.9 % (ref 11.5–15.5)
WBC: 6 10*3/uL (ref 4.0–10.5)
nRBC: 0 % (ref 0.0–0.2)

## 2019-06-15 LAB — ETHANOL: Alcohol, Ethyl (B): 142 mg/dL — ABNORMAL HIGH (ref <10)

## 2019-06-15 LAB — TROPONIN I (HIGH SENSITIVITY)
Troponin I (High Sensitivity): 8 ng/L (ref <18)
Troponin I (High Sensitivity): 3 ng/L (ref <18)

## 2019-06-15 LAB — I-STAT BETA HCG BLOOD, ED (MC, WL, AP ONLY): I-stat hCG, quantitative: <5 m[IU]/mL (ref <5)

## 2019-06-15 LAB — TSH: TSH: 1.503 u[IU]/mL (ref 0.350–4.500)

## 2019-06-15 LAB — T4, FREE: Free T4: 0.7 ng/dL (ref 0.61–1.12)

## 2019-06-15 LAB — CBG MONITORING, ED: Glucose-Capillary: 98 mg/dL (ref 70–99)

## 2019-06-15 LAB — APTT: aPTT: 28 seconds (ref 24–36)

## 2019-06-15 MED ORDER — NICOTINE 14 MG/24HR TD PT24
14.0000 mg | MEDICATED_PATCH | Freq: Every day | TRANSDERMAL | Status: DC
  Administered 2019-06-15 – 2019-06-16 (×2): 14 mg via TRANSDERMAL
  Filled 2019-06-15 (×2): qty 1

## 2019-06-15 MED ORDER — LORAZEPAM 1 MG PO TABS: 1.0000 mg | ORAL_TABLET | ORAL | Status: DC | PRN

## 2019-06-15 MED ORDER — DIPHENHYDRAMINE HCL 25 MG PO CAPS
25.0000 mg | ORAL_CAPSULE | Freq: Every evening | ORAL | Status: DC | PRN
  Administered 2019-06-15: 25 mg via ORAL
  Filled 2019-06-15: qty 1

## 2019-06-15 MED ORDER — FOLIC ACID 1 MG PO TABS
1.0000 mg | ORAL_TABLET | Freq: Every day | ORAL | Status: DC
  Administered 2019-06-15 – 2019-06-16 (×2): 1 mg via ORAL
  Filled 2019-06-15 (×2): qty 1

## 2019-06-15 MED ORDER — THIAMINE HCL 100 MG PO TABS
100.0000 mg | ORAL_TABLET | Freq: Every day | ORAL | Status: DC
  Administered 2019-06-16: 10:00:00 100 mg via ORAL
  Filled 2019-06-15: qty 1

## 2019-06-15 MED ORDER — ATENOLOL 50 MG PO TABS
50.0000 mg | ORAL_TABLET | Freq: Every day | ORAL | Status: DC
  Administered 2019-06-15 – 2019-06-16 (×2): 50 mg via ORAL
  Filled 2019-06-15: qty 1
  Filled 2019-06-15: qty 2

## 2019-06-15 MED ORDER — PROMETHAZINE HCL 25 MG/ML IJ SOLN
12.5000 mg | Freq: Four times a day (QID) | INTRAMUSCULAR | Status: DC | PRN
  Administered 2019-06-15 – 2019-06-16 (×3): 12.5 mg via INTRAVENOUS
  Filled 2019-06-15 (×3): qty 1

## 2019-06-15 MED ORDER — ONDANSETRON HCL 4 MG/2ML IJ SOLN: 4.0000 mg | Freq: Four times a day (QID) | INTRAMUSCULAR | Status: DC | PRN

## 2019-06-15 MED ORDER — PANTOPRAZOLE SODIUM 40 MG PO TBEC
40.0000 mg | DELAYED_RELEASE_TABLET | Freq: Every day | ORAL | Status: DC
  Administered 2019-06-15 – 2019-06-16 (×2): 40 mg via ORAL
  Filled 2019-06-15 (×2): qty 1

## 2019-06-15 MED ORDER — ESCITALOPRAM OXALATE 10 MG PO TABS
20.0000 mg | ORAL_TABLET | Freq: Every day | ORAL | Status: DC
  Administered 2019-06-15 – 2019-06-16 (×2): 20 mg via ORAL
  Filled 2019-06-15 (×2): qty 2

## 2019-06-15 MED ORDER — POTASSIUM CHLORIDE CRYS ER 20 MEQ PO TBCR
20.0000 meq | EXTENDED_RELEASE_TABLET | ORAL | Status: AC
  Administered 2019-06-15: 20 meq via ORAL
  Filled 2019-06-15: qty 1

## 2019-06-15 MED ORDER — LORAZEPAM 2 MG/ML IJ SOLN: 0.0000 mg | Freq: Two times a day (BID) | INTRAMUSCULAR | Status: DC

## 2019-06-15 MED ORDER — ENOXAPARIN SODIUM 40 MG/0.4ML ~~LOC~~ SOLN
40.0000 mg | SUBCUTANEOUS | Status: DC
  Administered 2019-06-15 – 2019-06-16 (×2): 40 mg via SUBCUTANEOUS
  Filled 2019-06-15 (×2): qty 0.4

## 2019-06-15 MED ORDER — LORAZEPAM 2 MG/ML IJ SOLN: 1.0000 mg | INTRAMUSCULAR | Status: DC | PRN

## 2019-06-15 MED ORDER — LORAZEPAM 2 MG/ML IJ SOLN: 0.0000 mg | Freq: Four times a day (QID) | INTRAMUSCULAR | Status: DC

## 2019-06-15 MED ORDER — DICYCLOMINE HCL 10 MG PO CAPS
10.0000 mg | ORAL_CAPSULE | Freq: Three times a day (TID) | ORAL | Status: DC
  Administered 2019-06-15 – 2019-06-16 (×4): 10 mg via ORAL
  Filled 2019-06-15 (×5): qty 1

## 2019-06-15 MED ORDER — ACETAMINOPHEN 325 MG PO TABS
650.0000 mg | ORAL_TABLET | Freq: Four times a day (QID) | ORAL | Status: DC | PRN
  Administered 2019-06-15 – 2019-06-16 (×2): 650 mg via ORAL
  Filled 2019-06-15 (×2): qty 2

## 2019-06-15 MED ORDER — ACETAMINOPHEN 650 MG RE SUPP: 650.0000 mg | Freq: Four times a day (QID) | RECTAL | Status: DC | PRN

## 2019-06-15 MED ORDER — SUCRALFATE 1 G PO TABS
1.0000 g | ORAL_TABLET | Freq: Three times a day (TID) | ORAL | Status: DC
  Administered 2019-06-15 – 2019-06-16 (×5): 1 g via ORAL
  Filled 2019-06-15 (×5): qty 1

## 2019-06-15 MED ORDER — VITAMIN B-12 1000 MCG PO TABS
2000.0000 ug | ORAL_TABLET | Freq: Every day | ORAL | Status: DC
  Administered 2019-06-15 – 2019-06-16 (×2): 2000 ug via ORAL
  Filled 2019-06-15 (×2): qty 2

## 2019-06-15 MED ORDER — MORPHINE SULFATE (PF) 2 MG/ML IV SOLN
2.0000 mg | INTRAVENOUS | Status: DC | PRN
  Administered 2019-06-15 – 2019-06-16 (×8): 2 mg via INTRAVENOUS
  Filled 2019-06-15 (×8): qty 1

## 2019-06-15 MED ORDER — ALBUTEROL SULFATE (2.5 MG/3ML) 0.083% IN NEBU: 2.5000 mg | INHALATION_SOLUTION | Freq: Four times a day (QID) | RESPIRATORY_TRACT | Status: DC | PRN

## 2019-06-15 MED ORDER — IOHEXOL 350 MG/ML SOLN
75.0000 mL | Freq: Once | INTRAVENOUS | Status: AC | PRN
  Administered 2019-06-15: 02:00:00 75 mL via INTRAVENOUS

## 2019-06-15 MED ORDER — THIAMINE HCL 100 MG/ML IJ SOLN: 100.0000 mg | Freq: Every day | INTRAMUSCULAR | Status: DC

## 2019-06-15 MED ORDER — NITROGLYCERIN 0.4 MG SL SUBL
0.4000 mg | SUBLINGUAL_TABLET | SUBLINGUAL | Status: DC | PRN
  Administered 2019-06-15: 0.4 mg via SUBLINGUAL

## 2019-06-15 MED ORDER — ONDANSETRON HCL 4 MG PO TABS
4.0000 mg | ORAL_TABLET | Freq: Four times a day (QID) | ORAL | Status: DC | PRN
  Administered 2019-06-15: 4 mg via ORAL
  Filled 2019-06-15: qty 1

## 2019-06-15 MED ORDER — SODIUM CHLORIDE 0.9 % IV SOLN: Freq: Once | INTRAVENOUS | Status: AC

## 2019-06-15 MED ORDER — SODIUM CHLORIDE 0.9% FLUSH
3.0000 mL | Freq: Two times a day (BID) | INTRAVENOUS | Status: DC
  Administered 2019-06-15 – 2019-06-16 (×3): 3 mL via INTRAVENOUS

## 2019-06-15 MED ORDER — THIAMINE HCL 100 MG/ML IJ SOLN
100.0000 mg | Freq: Once | INTRAMUSCULAR | Status: AC
  Administered 2019-06-15: 100 mg via INTRAVENOUS
  Filled 2019-06-15: qty 2

## 2019-06-15 MED ORDER — AMLODIPINE BESYLATE 10 MG PO TABS
10.0000 mg | ORAL_TABLET | Freq: Every day | ORAL | Status: DC
  Administered 2019-06-15 – 2019-06-16 (×2): 10 mg via ORAL
  Filled 2019-06-15: qty 1
  Filled 2019-06-15: qty 2

## 2019-06-15 MED ORDER — ASPIRIN EC 81 MG PO TBEC
81.0000 mg | DELAYED_RELEASE_TABLET | Freq: Every day | ORAL | Status: DC
  Administered 2019-06-16: 81 mg via ORAL
  Filled 2019-06-15: qty 1

## 2019-06-15 MED ORDER — ONDANSETRON HCL 4 MG/2ML IJ SOLN: 4.0000 mg | Freq: Once | INTRAMUSCULAR | Status: DC

## 2019-06-15 MED ORDER — ADULT MULTIVITAMIN W/MINERALS CH
1.0000 | ORAL_TABLET | Freq: Every day | ORAL | Status: DC
  Administered 2019-06-15 – 2019-06-16 (×2): 1 via ORAL
  Filled 2019-06-15 (×2): qty 1

## 2019-06-15 MED ORDER — ASPIRIN 81 MG PO CHEW
324.0000 mg | CHEWABLE_TABLET | Freq: Once | ORAL | Status: AC
  Administered 2019-06-15: 04:00:00 324 mg via ORAL
  Filled 2019-06-15: qty 4

## 2019-06-15 NOTE — ED Notes (Signed)
Lunch Tray Ordered @ 1033. 

## 2019-06-15 NOTE — ED Provider Notes (Signed)
TIME SEEN: 1:52 AM  CHIEF COMPLAINT: Chest pain, shortness of breath, left-sided weakness  HPI: Patient is a 54 year old female with history of substance abuse, lupus, malingering who presents to the emergency department with EMS.  Most of the history is provided by patient's significant other, Angel Price, by phone [phone]).  He reports tonight patient seemed to be at her normal state of health when she walked up a flight of stairs and appeared very short of breath.  Then became unresponsive and had a possible syncopal event.  He reports when fire department arrived at the house they started CPR.  It is unknown how long CPR was performed on EMS arrival patient had a pulse.  On EMS arrival they report that patient was unresponsive but had a pulse.  She became more responsive and they placed her on the stretcher.  It is unclear if there was any seizure-like activity.  Patient is now awake and alert and complaining of left-sided chest pressure that radiates down the left arm.  Also complaining of shortness of breath.  On exam patient has left-sided face, arm and leg weakness and numbness.  She reports this is new.  Last seen normal at 12:45 AM by her significant other.  She has chronic abdominal pain that is unchanged.  Reports vomiting and diarrhea.  No dysuria, hematuria, vaginal bleeding or discharge.  Has 2 open chronic draining abdominal wounds that are unchanged.  This is her sixth ED visit since February 2021.  Presented 05/09/2019 for weakness and had a normal MRI of the brain.   Patient was admitted 2/28-3/2 due to right-sided weakness and was found to be Covid positive.  Neurology thought symptoms inconsistent with stroke and likely psychogenic.  Patient readmitted to the hospital 3/4-3/6 due to episode of shortness of breath and becoming unresponsive and requiring CPR by BLS.  Patient thought to have had a syncopal event rather than true cardiac arrest.  Work-up unremarkable occluding normal EEG on  3/5 and normal EF on echocardiogram on 3/4.  Boyfriend reports patient underwent stress test today.  We do not currently have results for this test.  He states it was stopped prematurely secondary to patient's heart rate going high.   ROS: See HPI Constitutional: no fever  Eyes: no drainage  ENT: no runny nose   Cardiovascular:  chest pain  Resp: SOB  GI: no vomiting GU: no dysuria Integumentary: no rash  Allergy: no hives  Musculoskeletal: no leg swelling  Neurological: no slurred speech ROS otherwise negative  PAST MEDICAL HISTORY/PAST SURGICAL HISTORY:  Past Medical History:  Diagnosis Date  . Abscess    soft tissue  . Adrenal mass ([place])   . Alcohol abuse   . Anxiety   . Blood transfusion without reported diagnosis   . Chronic abdominal pain   . Chronic wound infection of abdomen   . Colon polyp    colonoscopy 04/2014  . Depression   . Diverticulosis    colonoscopy 04/2014 moderat pan colonic  . Drug-seeking behavior   . Gastritis    EGD 05/2014  . Gastroparesis Nov 2015  . GERD (gastroesophageal reflux disease)   . Hemorrhoid    internal large  . Hiatal hernia   . History of Billroth II operation   . Hypertension   . Lung nodule    CT 02/2014 needs repeat 1 month  . Lung nodule < 6cm on CT 04/25/2014  . Lupus ([place])   . Malingering   . Nausea and vomiting  chronic, recurrent  . Pancreatitis   . Schatzki's ring    patent per EGD 04/2014  . Sickle cell trait ([place])   . Suicide attempt ([place])   . Thyroid disease 2000   overactive, radiation    MEDICATIONS:  Prior to Admission medications   Medication Sig Start Date End Date Taking? Authorizing Provider  acetaminophen (TYLENOL) 500 MG tablet Take 2 tablets (1,000 mg total) by mouth 3 (three) times daily as needed for mild pain or moderate pain. 02/26/19   Enzo Bi, MD  amLODipine (NORVASC) 10 MG tablet Take 0.5 tablets (5 mg total) by mouth daily. Patient taking differently: Take 10 mg by mouth daily.   01/03/17   Doreatha Lew, MD  amLODipine (NORVASC) 10 MG tablet Take 10 mg by mouth daily. 04/29/19   [provider]  aspirin EC 81 MG EC tablet Take 1 tablet (81 mg total) by mouth daily. 05/19/19   Elgergawy, Silver Huguenin, MD  aspirin EC 81 MG tablet Take 81 mg by mouth daily.    [provider]  atenolol (TENORMIN) 50 MG tablet Take 50 mg by mouth daily. 02/01/19   [provider]  atenolol (TENORMIN) 50 MG tablet Take 50 mg by mouth daily. 04/29/19   [provider]  Cyanocobalamin (VITAMIN B12 PO) Take 1 tablet by mouth daily.    [provider]  dicyclomine (BENTYL) 10 MG capsule Take 1 capsule (10 mg total) by mouth 4 (four) times daily -  before meals and at bedtime. For diarrhea and abdominal cramps 12/09/18   Mahala Menghini, PA-C  dicyclomine (BENTYL) 10 MG capsule Take 10 mg by mouth 4 (four) times daily -  before meals and at bedtime.    [provider]  escitalopram (LEXAPRO) 20 MG tablet Take 1 tablet (20 mg total) by mouth daily. 07/16/13 07/16/14  Cloria Spring, MD  escitalopram (LEXAPRO) 20 MG tablet Take 20 mg daily by mouth.    [provider]  escitalopram (LEXAPRO) 20 MG tablet Take 20 mg by mouth daily. 04/29/19   [provider]  folic acid (FOLVITE) 1 MG tablet Take 1 tablet (1 mg total) by mouth daily. 11/13/18   Roxan Hockey, MD  folic acid (FOLVITE) 1 MG tablet Take 1 mg by mouth daily.    [provider]  methylPREDNISolone (MEDROL DOSEPAK) 4 MG TBPK tablet Use as directed 06/03/19   Leandrew Koyanagi, MD  mupirocin cream (BACTROBAN) 2 % Apply topically 2 (two) times daily. 02/26/19   Enzo Bi, MD  nicotine (NICODERM CQ - DOSED IN MG/24 HOURS) 14 mg/24hr patch Place 1 patch (14 mg total) onto the skin daily. 05/19/19   Elgergawy, Silver Huguenin, MD  ondansetron (ZOFRAN) 4 MG tablet Take 1 tablet (4 mg total) by mouth every 6 (six) hours as needed for nausea. [id number]   Delora Fuel, MD  ondansetron  (ZOFRAN) 4 MG tablet Take 4 mg by mouth every 8 (eight) hours as needed for nausea or vomiting.  05/03/19   [provider]  pantoprazole (PROTONIX) 40 MG tablet Take 40 mg by mouth daily. 04/29/19   [provider]  pantoprazole (PROTONIX) 40 MG tablet Take 40 mg by mouth daily. 04/29/19   [provider]  promethazine (PHENERGAN) 25 MG suppository Place 1 suppository (25 mg total) rectally every 6 (six) hours as needed for nausea or vomiting. 01/27/19   McDonald, Mia A, PA-C  promethazine (PHENERGAN) 25 MG tablet Take 25 mg by mouth every  8 (eight) hours as needed for nausea or vomiting.  03/22/19   [provider]  PROMETHEGAN 25 MG suppository Place 25 mg rectally every 8 (eight) hours as needed for nausea or vomiting.  04/06/19   [provider]  thiamine 100 MG tablet Take 1 tablet (100 mg total) by mouth daily. 11/13/18   Roxan Hockey, MD  thiamine 100 MG tablet Take 100 mg by mouth daily.    [provider]  vitamin B-12 (CYANOCOBALAMIN) 1000 MCG tablet Take 1,000 mcg by mouth daily.    [provider]    ALLERGIES:  Allergies  Allergen Reactions  . Codeine Hives  . Morphine And Related Hives  . Reglan [Metoclopramide] Other (See Comments)    EPS symptoms   . Reglan [Metoclopramide] Other (See Comments)    EPS symptoms     SOCIAL HISTORY:  Social History   Tobacco Use  . Smoking status: Current Every Day Smoker    Packs/day: 0.50    Years: 35.00    Pack years: 17.50    Types: Cigarettes  . Smokeless tobacco: Never Used  Substance Use Topics  . Alcohol use: Yes    FAMILY HISTORY: Family History  Problem Relation Age of Onset  . Lung cancer Father   . Brain cancer Son   . Schizophrenia Son   . Cancer Son        brain  . Cancer Father        mets  . Drug abuse Mother   . Breast cancer Maternal Aunt   . Bipolar disorder Maternal Aunt   . Drug abuse Maternal Aunt   . Colon cancer Maternal Grandmother         late 48s, early 7s  . Drug abuse Sister   . Drug abuse Brother   . Bipolar disorder Paternal Grandfather   . Bipolar disorder Cousin   . Liver disease Neg Hx     EXAM: BP 106/85 (BP Location: Right Arm)   Pulse 69   Temp 98.4 F (36.9 C) (Oral)   Resp 15   Ht 5\' 3"  (1.6 m)   Wt 64.9 kg   LMP  (LMP Unknown)   SpO2 100%   BMI 25.33 kg/m  CONSTITUTIONAL: Alert and oriented x 3 and responds appropriately to questions. Well-appearing; well-nourished HEAD: Normocephalic EYES: Conjunctivae clear, pupils appear equal, EOM appear intact ENT: normal nose; moist mucous membranes NECK: Supple, normal ROM CARD: RRR; S1 and S2 appreciated; no murmurs, no clicks, no rubs, no gallops RESP: Normal chest excursion without splinting or tachypnea; breath sounds clear and equal bilaterally; no wheezes, no rhonchi, no rales, no hypoxia or respiratory distress, speaking full sentences ABD/GI: Normal bowel sounds; non-distended; soft, diffusely tender to palpation throughout the abdomen which patient reports is chronic, two slightly open abdominal wounds with a small amount of clear/yellow appearing drainage, no rebound, no guarding, no peritoneal signs, no hepatosplenomegaly BACK:  The back appears normal EXT: Normal ROM in all joints; no deformity noted, no edema; no cyanosis SKIN: Normal color for age and race; warm; no rash on exposed skin NEURO: Patient has intermittent left facial droop but exam is inconsistent.  She also has inconsistent left upper and lower extremity weakness.  Normal speech.  Reports sensation decreased in the left side compared to the right.  When sticking her tongue out it appears she intentionally moves over to the right side.  Patient will not look to the left but again this appears to be intentional  as when she is not being tested during her neurologic exam her eye movements are normal. PSYCH: The patient's mood and manner are appropriate.   MEDICAL DECISION MAKING:  Patient here with multiple complaints.  It appears she has presented recently to the ED several times with similar.  She does have neurologic deficits on exam but exam is inconsistent.  Will obtain labs, head CT.  Patient also complaining of shortness of breath and chest pain.  She has no known cardiac history but is at risk for PE given recent Covid diagnosis and 2 recent hospitalizations.  Will obtain D-dimer, troponin.  EKG shows no ischemia.  Will give nitroglycerin, Zofran for symptomatic relief.  If head CT negative, will give aspirin.  Patient does have history of substance abuse.  Will obtain alcohol level and urine drug screen.  Code stroke called.  Dr. Leonel Ramsay with neurology has seen the patient.  He does not feel this is a code stroke as her exam is very inconsistent and suspect malingering.  Head CT shows no acute abnormality.  Does not feel patient needs admission or MRI of the brain from his standpoint.  We will continue cardiac work-up.   ED PROGRESS: Patient has had 2 normal troponins here.  Chest x-ray clear.  D-dimer elevated therefore CTA of the chest was obtained.  CTA shows no pulmonary embolus, aneurysm or dissection.  She has a stable right upper lobe opacity likely due to inflammation from previous Covid infection.  No new infiltrate..    Although patient does have significant history of malingering, drug-seeking behavior, substance abuse, I am concerned that she was found to be pulseless and unresponsive at home by BLS trained fire department and CPR was initiated.  It sounds like CPR was ongoing for several minutes before EMS arrived and a pulse was found.  Then again took several minutes before she seemed to become responsive.  I feel because of this history that she should be observed in the hospital on telemetry.  Will discuss with hospitalist.   6:14 AM Discussed patient's case with hospitalist, Dr. Alcario Drought.  I have recommended admission and patient (and family if  present) agree with this plan. Admitting physician will place admission orders.   I reviewed all nursing notes, vitals, pertinent previous records and interpreted all EKGs, lab and urine results, imaging (as available).     EKG Interpretation  Date/Time:  Tuesday June 15 2019 02:37:15 EDT Ventricular Rate:  63 PR Interval:    QRS Duration: 83 QT Interval:  454 QTC Calculation: 465 R Axis:   66 Text Interpretation: Sinus rhythm Abnormal R-wave progression, early transition No significant change since last tracing Confirmed by Pryor Curia [phone]) on 06/15/2019 2:42:29 AM       CRITICAL CARE Performed by: Cyril Mourning Aly Seidenberg   Total critical care time: 50 minutes  Critical care time was exclusive of separately billable procedures and treating other patients.  Critical care was necessary to treat or prevent imminent or life-threatening deterioration.  Critical care was time spent personally by me on the following activities: development of treatment plan with patient and/or surrogate as well as nursing, discussions with consultants, evaluation of patient's response to treatment, examination of patient, obtaining history from patient or surrogate, ordering and performing treatments and interventions, ordering and review of laboratory studies, ordering and review of radiographic studies, pulse oximetry and re-evaluation of patient's condition.   GENECE WAHLERT was evaluated in Emergency Department on 06/15/2019 for the symptoms described in the history of  present illness. She was evaluated in the context of the global COVID-19 pandemic, which necessitated consideration that the patient might be at risk for infection with the SARS-CoV-2 virus that causes COVID-19. Institutional protocols and algorithms that pertain to the evaluation of patients at risk for COVID-19 are in a state of rapid change based on information released by regulatory bodies including the CDC and federal and state  organizations. These policies and algorithms were followed during the patient's care in the ED.  Patient was seen wearing N95, face shield, gloves.    Ronin Crager, Delice Bison, DO 06/15/19 0630

## 2019-06-15 NOTE — ED Triage Notes (Signed)
Pt BIB [facility] from home, original call was for respiratory distress, CPR was done by fire until ROSC achieved. Pt still unresponsive when EMS arrived, came to when put in truck. Similar episode happened 3wks ago (around Mar 3), was related to electrolyte deficiencies. Was diagnosed today with CHF & had stress test. Pt also complains of 10/10 chest pain, possibly related to compressions. Pt also presents with junky, productive cough that has been present since having Covid in Feb.  VSS en route, no other complaints at this time.

## 2019-06-15 NOTE — Progress Notes (Signed)
Patient get to floor at 1805 from ED alert and oriented, c/o CP 8/10 that radiate to left arm pt. Stated pain is same pain he been having all day. MD notified, see epic for new order and intervention. Will continue to monitor patient.

## 2019-06-15 NOTE — ED Notes (Signed)
Hooked patient up to the monitor patient is resting with call bell in reach 

## 2019-06-15 NOTE — H&P (Signed)
History and Physical    Angel Price [id number] DOB: [birth year] DOA: 06/15/2019  Referring MD/NP/PA: Jennette Kettle, MD PCP: Rosita Fire, MD  Patient coming from: home via EMS  Chief Complaint: Shortness of breath  I have personally briefly reviewed patient's old medical records in [place]   HPI: Angel Price is a 54 y.o. female with medical history significant of hypertension, lupus, pancreatitis, chronic abdominal pain, polysubstance abuse (alcohol and cocaine), anxiety, depression, patient disorder/malingering, history of Billroth II hemigastrectomy for peptic ulcers, and  abdominal wall fistulas with abscesses.  She presents after complaining of shortness of breath and subsequently becoming unresponsive.  Reported to be in her normal state of health prior to 12:45 AM.  It was reported that she had been walking up a flight of stairs at and became very short of breath and went to lay down and was noted to be unresponsive.  Upon arrival of the fire department they were unable to find a pulse and perform CPR for unknown period in time.  EMS reported patient had a pulse upon their arrival.  The patient eventually became more alert patient complains of left-sided chest pressure that radiates down left arm and shortness of breath.  She does not recall any of the events that brought her to the hospitalpatient reported associated symptoms of generalized fatigue and weight gain of 20 pounds in the last month.  She denies drinking alcohol on a daily basis, but did drink yesterday because of a family member who recently passed away from COVID-72.  Patient still continues to have abdominal pain, but is in the process of being set up at [facility] for further management.  Just recently hospitalized from 3/3-3/6 after presenting with complaints of shortness of breath.  She had reportedly received CPR prior to arrival, was found to be positive for COVID-19 infection, found to have  low TSH/free T4, and complaint of right-sided weakness.  She was evaluated by cardiology, but felt likely to be related to syncope.  Patient was to be otherwise asymptomatic from her COVID-19 diagnosis and did not receive remdesivir or steroids.  It appears that she had several visits with complaints of right and left-sided weakness.  In February patient underwent MRI which was negative for any signs of a stroke and thereafter had psychiatric consults for which a psychogenic cause was suspected her complaints of weakness.   ED Course: Upon admission into the emergency department patient was seen to be afebrile with vital signs relatively within normal limits.  Stroke was called and patient was evaluated by neurology CT scan of the head showed no acute abnormalities.  Neurology felt exam is inconsistent with stroke and suspected conversion disorder vs malingering.  Labs significant for hemoglobin 11.7, potassium 3.4, AST 93, troponin 8->3, alcohol level 142, and D-dimer 2.92.  EKG did not reveal any ischemic findings.  Chest x-ray otherwise noted to be clear. CT angiogram of the chest did not reveal any signs of pulmonary embolism and all other findings appeared stable.  Patient had been given full dose aspirin and thiamine, TRH called to admit..  Review of Systems  Constitutional: Negative for fever.  HENT: Negative for ear discharge and nosebleeds.   Eyes: Negative for photophobia and discharge.  Respiratory: Positive for shortness of breath. Negative for cough.   Cardiovascular: Positive for chest pain.  Gastrointestinal: Positive for abdominal pain.  Genitourinary: Negative for frequency and urgency.  Musculoskeletal: Positive for falls.  Neurological: Positive for focal weakness and  loss of consciousness.  Psychiatric/Behavioral: Positive for memory loss and substance abuse.    Past Medical History:  Diagnosis Date  . Abscess    soft tissue  . Adrenal mass ([place])   . Alcohol abuse   .  Anxiety   . Blood transfusion without reported diagnosis   . Chronic abdominal pain   . Chronic wound infection of abdomen   . Colon polyp    colonoscopy 04/2014  . Depression   . Diverticulosis    colonoscopy 04/2014 moderat pan colonic  . Drug-seeking behavior   . Gastritis    EGD 05/2014  . Gastroparesis Nov 2015  . GERD (gastroesophageal reflux disease)   . Hemorrhoid    internal large  . Hiatal hernia   . History of Billroth II operation   . Hypertension   . Lung nodule    CT 02/2014 needs repeat 1 month  . Lung nodule < 6cm on CT 04/25/2014  . Lupus ([place])   . Malingering   . Nausea and vomiting    chronic, recurrent  . Pancreatitis   . Schatzki's ring    patent per EGD 04/2014  . Sickle cell trait ([place])   . Suicide attempt ([place])   . 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 ABDOMINAL HYSTERECTOMY  2013   [place]  . ABDOMINAL HYSTERECTOMY    . ABDOMINAL SURGERY    . ADRENALECTOMY Right   . AGILE CAPSULE N/A 01/05/2015   Procedure: AGILE CAPSULE;  Surgeon: Daneil Dolin, MD;  [facility];  Service: Endoscopy;  Laterality: N/A;  0700  . Billroth II procedure      [place], first 2000, 2005/2006.  . bilroth 2    . BIOPSY  05/20/2013   Procedure: BIOPSIES OF ASCENDING AND SIGMOID COLON;  Surgeon: Daneil Dolin, MD;  Location: [facility];  Service: Endoscopy;;  . BIOPSY  04/26/2014   Procedure: BIOPSIES;  Surgeon: Danie Binder, MD;  Location: [facility];  Service: Endoscopy;;  . BIOPSY  12/24/2018   Procedure: BIOPSY;  Surgeon: Daneil Dolin, MD;  [facility];  Service: Endoscopy;;  colon  . CHOLECYSTECTOMY    . COLONOSCOPY     in [place]  . COLONOSCOPY WITH PROPOFOL N/A 05/20/2013   Dr.Rourk- inadequate prep, normal appearing rectum, grossly normal colon aside from pancolonic diverticula, normal terminal ileum bx= unremarkable colonic mucosa. Due for early interval 2016.   Marland Kitchen COLONOSCOPY  WITH PROPOFOL N/A 04/26/2014   [id number] ileum/one colon polyp removed/moderate pan-colonic diverticulosis/large internal hemorrhoids  . COLONOSCOPY WITH PROPOFOL N/A 12/23/2014   Dr.Rourk- minimal internal hemorrhoids, pancolonic diverticulosis  . COLONOSCOPY WITH PROPOFOL N/A 08/23/2016   Procedure: COLONOSCOPY WITH PROPOFOL;  Surgeon: Danie Binder, MD;  [facility];  Service: Endoscopy;  Laterality: N/A;  . COLONOSCOPY WITH PROPOFOL N/A 12/24/2018   Pancolonic diverticulosis, normal TI, one 5 mm polyp in rectum (tubular adenoma). Somewhat friable hemorrhagic mucosa in area of IC valve/cecum, possibly related to scope trauma s/p biopsy.   . DEBRIDEMENT OF ABDOMINAL WALL ABSCESS N/A 02/08/2013   Procedure: DEBRIDEMENT OF ABDOMINAL WALL ABSCESS;  Surgeon: Jamesetta So, MD;  Location: [facility];  Service: General;  Laterality: N/A;  . ESOPHAGOGASTRODUODENOSCOPY (EGD) WITH PROPOFOL N/A 05/20/2013   Dr.Rourk- s/p prior gastric surgery with normal esophagus, residual gastric mucosa and patent efferent limb  . ESOPHAGOGASTRODUODENOSCOPY (EGD) WITH PROPOFOL N/A 02/03/2014   Dr. Gala Romney:  s/p hemigastrectomy with retained gastric contents. Residual gastric mucosa and efferent limb appeared normal otherwise. Query gastroparesis.   Marland Kitchen ESOPHAGOGASTRODUODENOSCOPY (EGD) WITH PROPOFOL N/A 04/26/2014   [id number] ring/small HH/mild non-erosive gasrtitis/normal anastomosis  . ESOPHAGOGASTRODUODENOSCOPY (EGD) WITH PROPOFOL N/A 12/23/2014   Dr.Rourk- s/p prior hemigastrctomy, active oozing from anastomotic suture site, hemostasis achieved  . ESOPHAGOGASTRODUODENOSCOPY (EGD) WITH PROPOFOL N/A 05/23/2016   Dr. 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 ESOPHAGOGASTRODUODENOSCOPY (EGD) WITH PROPOFOL N/A 05/11/2017   Procedure: ESOPHAGOGASTRODUODENOSCOPY (EGD) WITH PROPOFOL;  Surgeon: Daneil Dolin, MD;  [facility];  Service: Endoscopy;  Laterality: N/A;  . HEMORRHOID SURGERY N/A 06/18/2017   Procedure: THREE COLUMN EXTENSIVE HEMORRHOIDECTOMY;  Surgeon: Virl Cagey, MD;  Location: [facility];  Service: General;  Laterality: N/A;  . INCISION AND DRAINAGE ABSCESS N/A 01/06/2017   Procedure: INCISION AND DRAINAGE ABDOMINAL WALL ABSCESS;  Surgeon: Aviva Signs, MD;  Location: [facility];  Service: General;  Laterality: N/A;  . POLYPECTOMY  12/24/2018   Procedure: POLYPECTOMY;  Surgeon: Daneil Dolin, MD;  [facility];  Service: Endoscopy;;  colon  . tendon repar Right    wrist  . WOUND EXPLORATION Right 06/24/2013   Procedure: exploration of traumatic wound right wrist;  Surgeon: Tennis Must, MD;  Location: [place];  Service: Orthopedics;  Laterality: Right;     reports that she has been smoking cigarettes. She has a 17.50 pack-year smoking history. She has never used smokeless tobacco. She reports current alcohol use. She reports previous drug use. Drug: Cocaine.  Allergies  Allergen Reactions  . Codeine Hives  . Morphine And Related Hives  . Reglan [Metoclopramide] Other (See Comments)    EPS symptoms   . Reglan [Metoclopramide] Other (See Comments)    EPS symptoms     Family History  Problem Relation Age of Onset  . Lung cancer Father   . Brain cancer Son   . Schizophrenia Son   . Cancer Son        brain  . Cancer Father        mets  . Drug abuse Mother   . Breast cancer Maternal Aunt   . Bipolar disorder Maternal Aunt   . Drug abuse Maternal Aunt   . Colon cancer Maternal Grandmother        late 83s, early 15s  . Drug abuse Sister   . Drug abuse Brother   . Bipolar disorder Paternal Grandfather   . Bipolar disorder Cousin   . Liver disease Neg Hx     Prior to Admission medications   Medication Sig Start Date End Date Taking? Authorizing Provider  amLODipine (NORVASC) 10 MG tablet Take 0.5 tablets (5 mg total) by mouth daily. Patient taking differently: Take  10 mg by mouth daily.  01/03/17  Yes Patrecia Pour, Christean Grief, MD  aspirin EC 81 MG EC tablet Take 1 tablet (81 mg total) by mouth daily. 05/19/19  Yes Elgergawy, Silver Huguenin, MD  atenolol (TENORMIN) 50 MG tablet Take 50 mg by mouth daily. 02/01/19  Yes [provider]  Cyanocobalamin (VITAMIN B12 PO) Take 1 tablet by mouth daily.   Yes [provider]  dicyclomine (BENTYL) 10 MG capsule Take 1 capsule (10 mg total) by mouth 4 (four) times daily -  before meals and at bedtime. For diarrhea and abdominal cramps 12/09/18  Yes Mahala Menghini, PA-C  escitalopram [facility])  20 MG tablet Take 20 mg daily by mouth.   Yes [provider]  folic acid (FOLVITE) 1 MG tablet Take 1 tablet (1 mg total) by mouth daily. 11/13/18  Yes Emokpae, Courage, MD  omeprazole (PRILOSEC) 20 MG capsule Take 20 mg by mouth daily.   Yes [provider]  ondansetron (ZOFRAN) 4 MG tablet Take 1 tablet (4 mg total) by mouth every 6 (six) hours as needed for nausea. [id number]  Yes Delora Fuel, MD  pantoprazole (PROTONIX) 40 MG tablet Take 40 mg by mouth daily. 04/29/19  Yes [provider]  promethazine (PHENERGAN) 25 MG tablet Take 25 mg by mouth every 6 (six) hours as needed for nausea or vomiting.   Yes [provider]  sucralfate (CARAFATE) 1 g tablet Take 1 g by mouth 4 (four) times daily -  with meals and at bedtime.   Yes [provider]  acetaminophen (TYLENOL) 500 MG tablet Take 2 tablets (1,000 mg total) by mouth 3 (three) times daily as needed for mild pain or moderate pain. 02/26/19   Enzo Bi, MD  nicotine (NICODERM CQ - DOSED IN MG/24 HOURS) 14 mg/24hr patch Place 1 patch (14 mg total) onto the skin daily. 05/19/19   Elgergawy, Silver Huguenin, MD  promethazine (PHENERGAN) 25 MG suppository Place 1 suppository (25 mg total) rectally every 6 (six) hours as needed for nausea or vomiting. 01/27/19   McDonald, Mia A, PA-C  thiamine 100 MG tablet Take 1 tablet (100 mg total) by mouth  daily. 11/13/18   Roxan Hockey, MD  vitamin B-12 (CYANOCOBALAMIN) 1000 MCG tablet Take 1,000 mcg by mouth daily.    [provider]    Physical Exam:  Constitutional: Middle aged NAD, calm, comfortable Vitals:   06/15/19 0545 06/15/19 0600 06/15/19 0615 06/15/19 0630  BP: 115/85 110/80 100/77 104/82  Pulse:    70  Resp: 16 20 20 18   Temp:      TempSrc:      SpO2:    97%  Weight:      Height:       Eyes: PERRL, lids and conjunctivae normal ENMT: Mucous membranes are moist. Posterior pharynx clear of any exudate or lesions.  Neck: normal, supple, no masses, no thyromegaly Respiratory: clear to auscultation bilaterally, no wheezing, no crackles. Normal respiratory effort. No accessory muscle use.  Cardiovascular: Regular rate and rhythm, no murmurs / rubs / gallops. No extremity edema. 2+ pedal pulses. No carotid bruits.  Abdomen: no tenderness, no masses palpated. No hepatosplenomegaly. Bowel sounds positive.  Musculoskeletal: no clubbing / cyanosis. No joint deformity upper and lower extremities. Good ROM, no contractures. Normal muscle tone.  Skin: no rashes, lesions, ulcers. No induration Neurologic: CN 2-12 grossly intact. Sensation intact, DTR normal. Strength 5/5 in all 4.  Psychiatric: Normal judgment and insight. Alert and oriented x 3. Normal mood.     Labs on Admission: I have personally reviewed following labs and imaging studies  CBC: Recent Labs  Lab 06/15/19 0315  WBC 6.0  HGB 11.7*  HCT 33.4*  MCV 80.9  PLT [id number]   Basic Metabolic Panel: Recent Labs  Lab 06/15/19 0315  NA 140  K 3.4*  CL 107  CO2 23  GLUCOSE 96  BUN 9  CREATININE 0.42*  CALCIUM 9.2   GFR: Estimated Creatinine Clearance: 75.4 mL/min (A) (by C-G formula based on SCr of 0.42 mg/dL (L)). Liver Function Tests: Recent Labs  Lab 06/15/19 0315  AST 93*  ALT 42  ALKPHOS 65  BILITOT 0.3  PROT 6.6  ALBUMIN 3.3*   No results for input(s): LIPASE, AMYLASE in the last 168  hours. No results for input(s): AMMONIA in the last 168 hours. Coagulation Profile: Recent Labs  Lab 06/15/19 0243  INR 1.1   Cardiac Enzymes: No results for input(s): CKTOTAL, CKMB, CKMBINDEX, TROPONINI in the last 168 hours. BNP (last 3 results) No results for input(s): PROBNP in the last 8760 hours. HbA1C: No results for input(s): HGBA1C in the last 72 hours. CBG: Recent Labs  Lab 06/15/19 0213  GLUCAP 98   Lipid Profile: No results for input(s): CHOL, HDL, LDLCALC, TRIG, CHOLHDL, LDLDIRECT in the last 72 hours. Thyroid Function Tests: No results for input(s): TSH, T4TOTAL, FREET4, T3FREE, THYROIDAB in the last 72 hours. Anemia Panel: No results for input(s): VITAMINB12, FOLATE, FERRITIN, TIBC, IRON, RETICCTPCT in the last 72 hours. Urine analysis:    Component Value Date/Time   COLORURINE STRAW (A) 06/15/2019 0319   APPEARANCEUR CLEAR 06/15/2019 0319   LABSPEC 1.005 06/15/2019 0319   PHURINE 6.0 06/15/2019 0319   GLUCOSEU NEGATIVE 06/15/2019 0319   HGBUR NEGATIVE 06/15/2019 0319   BILIRUBINUR NEGATIVE 06/15/2019 0319   KETONESUR NEGATIVE 06/15/2019 0319   PROTEINUR NEGATIVE 06/15/2019 0319   UROBILINOGEN 0.2 11/26/2014 0915   NITRITE NEGATIVE 06/15/2019 0319   LEUKOCYTESUR NEGATIVE 06/15/2019 0319   Sepsis Labs: No results found for this or any previous visit (from the past 240 hour(s)).   Radiological Exams on Admission: CT Angio Chest PE W and/or Wo Contrast  Result Date: 06/15/2019 CLINICAL DATA:  Respiratory distress. EXAM: CT ANGIOGRAPHY CHEST WITH CONTRAST TECHNIQUE: Multidetector CT imaging of the chest was performed using the standard protocol during bolus administration of intravenous contrast. Multiplanar CT image reconstructions and MIPs were obtained to evaluate the vascular anatomy. CONTRAST:  41mL OMNIPAQUE IOHEXOL 350 MG/ML SOLN COMPARISON:  CT scan 05/18/2019 FINDINGS: Cardiovascular: The heart is upper limits of normal in size and appears stable.  No pericardial effusion. Mild tortuosity and ectasia of the thoracic aorta and scattered atherosclerotic calcifications but no aneurysm or dissection. The branch vessels are patent. Stable three-vessel coronary artery calcifications. Mild enlargement of the pulmonary arterial trunk which may suggest pulmonary hypertension. The pulmonary arterial tree is well opacified. No filling defects to suggest pulmonary embolism. Mediastinum/Nodes: No mediastinal or hilar mass or adenopathy. Stable scattered sub 8 mm lymph nodes. The esophagus is grossly normal. Lungs/Pleura: Dependent atelectasis/edema. There is also patchy airspace opacity in the right upper lobe which is stable and likely atelectasis or residual inflammation. Stable calcified granuloma in the right middle lobe. No worrisome pulmonary lesions. No pleural effusions. Upper Abdomen: No significant upper abdominal findings. Stable surgical changes. Musculoskeletal: No breast masses, supraclavicular or axillary adenopathy. The thyroid gland is grossly normal. The bony structures are unremarkable. Review of the MIP images confirms the above findings. IMPRESSION: 1. No CT findings for pulmonary embolism. 2. Stable mild tortuosity and ectasia of the thoracic aorta but no aneurysm or dissection. 3. Stable three-vessel coronary artery calcifications. 4. Stable patchy airspace opacity in the right upper lobe, likely atelectasis or residual inflammation. 5. No mediastinal or hilar mass or adenopathy. Aortic Atherosclerosis (ICD10-I70.0). Aortic Atherosclerosis (ICD10-I70.0). Electronically Signed   By: Marijo Sanes M.D.   On: 06/15/2019 06:22   DG Chest Portable 1 View  Result Date: 06/15/2019 CLINICAL DATA:  Chest pain and shortness of breath EXAM: PORTABLE CHEST 1 VIEW COMPARISON:  05/20/2019 FINDINGS: The heart size and mediastinal contours are within  normal limits. Both lungs are clear. The visualized skeletal structures are unremarkable. IMPRESSION: No active  disease. Electronically Signed   By: Ulyses Jarred M.D.   On: 06/15/2019 02:17   CT HEAD CODE STROKE WO CONTRAST  Result Date: 06/15/2019 CLINICAL DATA:  Code stroke.  Left-sided weakness EXAM: CT HEAD WITHOUT CONTRAST TECHNIQUE: Contiguous axial images were obtained from the base of the skull through the vertex without intravenous contrast. COMPARISON:  None. FINDINGS: Brain: There is no mass, hemorrhage or extra-axial collection. The size and configuration of the ventricles and extra-axial CSF spaces are normal. The brain parenchyma is normal, without evidence of acute or chronic infarction. Vascular: No abnormal hyperdensity of the major intracranial arteries or dural venous sinuses. No intracranial atherosclerosis. Skull: The visualized skull base, calvarium and extracranial soft tissues are normal. Sinuses/Orbits: No fluid levels or advanced mucosal thickening of the visualized paranasal sinuses. No mastoid or middle ear effusion. The orbits are normal. ASPECTS [facility] Stroke Program Early CT Score) - Ganglionic level infarction (caudate, lentiform nuclei, internal capsule, insula, M1-M3 cortex): 7 - Supraganglionic infarction (M4-M6 cortex): 3 Total score (0-10 with 10 being normal): 10 IMPRESSION: 1. No acute abnormality 2. ASPECTS is 10 3. These results were communicated to Dr. Roland Rack at 2:24 am on 06/15/2019 by text page via the [facility] messaging system. Electronically Signed   By: Ulyses Jarred M.D.   On: 06/15/2019 02:25   PCV MYOCARDIAL PERFUSION WITH LEXISCAN  Result Date: 06/14/2019 Lexiscan Tetrofosmin stress test 06/14/2019: No previous exam available for comparison. Lexiscan nuclear stress test performed using 1-day protocol. Stress symptoms included chest pain, dyspnea, dizziness. Stress EKG is non-diagnostic, as this is pharmacological stress test. In additional, stress EKG shows sinus tachycardia at 83% MPHR, no ischemic changes. Myocardial perfusion imaging is normal. Stress  LVEF 72% with normal myocardial thickening. Low risk study.    EKG: Independently reviewed.  Sinus rhythm at 63 bpm with  Assessment/Plan Left sided weakness: Patient noted to complain of left-sided weakness. CT scan of the brain showed no acute abnormalities.  Neurology initially evaluated patient, but felt symptoms were inconsistent.  It appears patient has had multiple visits in the past with complaints of left or right-sided weakness which no stroke has been previously found.  Last noted to complain of right-sided weakness in 04/2019 where MRI revealed no significant changes when compared to 2018.  Question possibility of conversion disorder versus malingering. Neurology had recommended outpatient psychotherapy for conversion disorder. -Admit to a cardiac telemetry bed -PT/OT to evaluate and treat  Possible cardiac arrest, elevated D-dimer: It was documented that when patient was seen first by the fire department they were unable to find a pulse and had started CPR.  No clear details on how long CPR was performed, but patient noted her pulse upon EMS arrival.  EKG was otherwise unchanged, troponins negative, and no signs of pulmonary embolus on CT angiogram of the chest.  Similar symptoms during admission earlier this month. -Follow-up telemetry -Discussed case with Dr. Virgina Jock over the phone who we will set the patient up for outpatient Holter monitor.  Chest pain: Patient just recently had stress test on 3/29 which was noted to be low risk.  Troponins otherwise noted to be negative x2 with no significant EKG changes.  Possible sick euthyrid: During last hospitalization patient was found to have low TSH 0.27 and free T4 0.54.  Patient reports recent weight gain and fatigue.  Question patient had a sick euthyroid at that time and it  was recommended the patient have repeat check in 1 month. -Recheck TSH and Free T4  Hypokalemia: Acute.  Potassium noted to be 3.4 on admission -Give Potassium  Chloride 20 meq -Change to monitor and place as needed  Alcohol abuse with acute intoxication: Patient reports that she is not drinking every day, but initial alcohol level elevated at 142.  Question the possibility of obtundation from alcohol as reason for patient being unresponsive. -Counseled on the need of cessation of alcohol -CIWA protocol initiated  Shortness of breath, history of COVID-19 infection: Patient was positive for COVID-19 on 2/28.  She continues to complain of shortness of breath, but maintaining O2 saturations on room air.  Chest x-ray otherwise noted to be clear.  She was not previously treated with remdesivir or steroids during her last hospitalization.  Question if symptoms are prolonged sequela. -Continue to monitor  Essential hypertension: Home medications include amlodipine 10 mg daily and atenolol 50 mg daily. -Continue home regimen  Normocytic anemia: Chronic.  Hemoglobin 11.7 which appears unchanged from patient's baseline. -Continue to monitor  Elevated AST: AST noted to be 93 with ALT 42.  The AST to ALT ratio suggests alcohol abuse. -Continue to monitor in outpatient setting  Depression/anxiety -Continue Lexapro  Chronic abdominal pain: Patient is in the process of being set up for outpatient follow-up for history of abdominal fistulas and abscesses from previous history of partial gastrectomy due to peptic ulcer disease per  GERD -Continue Protonix and Carafate  Tobacco abuse -Continue to counsel on need of cessation of tobacco use  DVT prophylaxis: Lovenox Code Status: Full Family Communication: boyfriend updated over the phone Disposition Plan: Likely discharge home in a.m. if work-up negative Consults called: Neurology  Admission status: observation  Norval Morton MD [facility] Pager [phone]   If 7PM-7AM, please contact night-coverage [email or web address] Password [facility]  06/15/2019, 7:16 AM

## 2019-06-15 NOTE — Consult Note (Signed)
Neurology Consultation Reason for Consult: Left-sided weakness Referring Physician: Ward, K  CC: Left-sided weakness  History is obtained from: Patient  HPI: Angel Price is a 54 y.o. female who was in her normal state of health until she began having respiratory distress at home.  #1 was called and fire arrived.  She was unresponsive when they arrived and had CPR performed.  On awakening in the emergency department, she was noted to have left-sided weakness.  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  She was seen by Dr. [name] in 2018 with a note that she likely had some "psychosomatic symptoms/embellishment" with consideration of MRI negative infarct.  On 2/21 I saw her diagnosed with conversion disorder, she was then seen by Dr. Cheral Marker on 2/28 similar findings.  She came in on 3/4 with an episode of unresponsiveness.  An EEG was performed at that time which was normal.   LKW: 12:45 AM tpa given?: no, not a stroke   ROS: Unable to obtain due to altered mental status.   Past Medical History:  Diagnosis Date  . Abscess    soft tissue  . Adrenal mass ([place])   . Alcohol abuse   . Anxiety   . Blood transfusion without reported diagnosis   . Chronic abdominal pain   . Chronic wound infection of abdomen   . Colon polyp    colonoscopy 04/2014  . Depression   . Diverticulosis    colonoscopy 04/2014 moderat pan colonic  . Drug-seeking behavior   . Gastritis    EGD 05/2014  . Gastroparesis Nov 2015  . GERD (gastroesophageal reflux disease)   . Hemorrhoid    internal large  . Hiatal hernia   . History of Billroth II operation   . Hypertension   . Lung nodule    CT 02/2014 needs repeat 1 month  . Lung nodule < 6cm on CT 04/25/2014  . Lupus ([place])   . Malingering   . Nausea and vomiting    chronic, recurrent  . Pancreatitis   . Schatzki's ring    patent per EGD 04/2014  . Sickle cell trait ([place])   . Suicide attempt ([place])   .  Thyroid disease 2000   overactive, radiation     Family History  Problem Relation Age of Onset  . Lung cancer Father   . Brain cancer Son   . Schizophrenia Son   . Cancer Son        brain  . Cancer Father        mets  . Drug abuse Mother   . Breast cancer Maternal Aunt   . Bipolar disorder Maternal Aunt   . Drug abuse Maternal Aunt   . Colon cancer Maternal Grandmother        late 10s, early 5s  . Drug abuse Sister   . Drug abuse Brother   . Bipolar disorder Paternal Grandfather   . Bipolar disorder Cousin   . Liver disease Neg Hx      Social History:  reports that she has been smoking cigarettes. She has a 17.50 pack-year smoking history. She has never used smokeless tobacco. She reports current alcohol use. She reports previous drug use. Drug: Cocaine.   Exam: Current vital signs: BP 106/85 (BP Location: Right Arm)   Pulse 69   Temp 98.4 F (36.9 C) (Oral)   Resp 15   Ht 5\' 3"  (1.6 m)   Wt 64.9 kg   LMP  (  LMP Unknown)   SpO2 100%   BMI 25.33 kg/m  Vital signs in last 24 hours: Temp:  [98.4 F (36.9 C)] 98.4 F (36.9 C) (03/30 0145) Pulse Rate:  [69] 69 (03/30 0145) Resp:  [15] 15 (03/30 0145) BP: (106)/(85) 106/85 (03/30 0145) SpO2:  [100 %] 100 % (03/30 0145) Weight:  [64.9 kg] 64.9 kg (03/30 0135)   Physical Exam  Constitutional: Appears well-developed and well-nourished.  Psych: Affect appropriate to situation Eyes: No scleral injection HENT: No OP obstrucion MSK: no joint deformities.  Cardiovascular: Normal rate and regular rhythm.  Respiratory: Effort normal, non-labored breathing GI: Soft.  No distension. There is no tenderness.  Skin: WDI  Neuro: Mental Status: Patient is awake, alert, oriented to person, place, month, year, and situation. No signs of aphasia or neglect Cranial Nerves: II: Visual Fields are full. Pupils are equal, round, and reactive to light.   III,IV, VI: EOMI without ptosis or diploplia.  V: She is post midline to  pinprick on the forehead, decreased subjectively on the left VII: She does not lift the left side of her mouth when she smiles, however when asked to puff out her cheeks, she puffs out the right side but holds left side taut.  VIII: hearing is intact to voice X: Uvula elevates symmetrically XI: Shoulder shrug is symmetric. XII: tongue is midline without atrophy or fasciculations.  Motor: Tone is normal. Bulk is normal. 5/5 strength was present on the right, she is completely flaccid in the left arm, however 1 position so that it would fall towards her face she avoids her face.  She does not move her left side at all until will be repositioned she moves it accidentally at which point I point this out to her and she says though I can move it some. Sensory: Sensation is diminished throughout the left side Cerebellar: No clear ataxia on the right     I have reviewed labs in epic and the results pertinent to this consultation are: CBG 98  I have reviewed the images obtained: CT head-negative  Impression: 54 year old female with episode of respiratory distress status post CPR.  I am not certain what caused her respiratory distress, but the neurological symptoms which she displays on exam are consistent with psychogenic, nonorganic etiology.  I discussed this with the patient, explaining that treatment is outpatient psychotherapy and reassuring her, given her permission to improve.  Recommendations: 1) no further neurodiagnostic testing at this time 2) outpatient psychotherapy for conversion disorder.   Roland Rack, MD [facility] [phone]  If 7pm- 7am, please page neurology on call as listed in [place].

## 2019-06-16 DIAGNOSIS — F449 Dissociative and conversion disorder, unspecified: Secondary | ICD-10-CM

## 2019-06-16 DIAGNOSIS — R079 Chest pain, unspecified: Secondary | ICD-10-CM

## 2019-06-16 DIAGNOSIS — E876 Hypokalemia: Secondary | ICD-10-CM | POA: Diagnosis not present

## 2019-06-16 DIAGNOSIS — Z720 Tobacco use: Secondary | ICD-10-CM

## 2019-06-16 DIAGNOSIS — F10129 Alcohol abuse with intoxication, unspecified: Secondary | ICD-10-CM | POA: Diagnosis not present

## 2019-06-16 LAB — BASIC METABOLIC PANEL
Anion gap: 11 (ref 5–15)
BUN: 12 mg/dL (ref 6–20)
CO2: 19 mmol/L — ABNORMAL LOW (ref 22–32)
Calcium: 9.3 mg/dL (ref 8.9–10.3)
Chloride: 106 mmol/L (ref 98–111)
Creatinine, Ser: 0.6 mg/dL (ref 0.44–1.00)
GFR calc Af Amer: >60 mL/min (ref >60)
GFR calc non Af Amer: >60 mL/min (ref >60)
Glucose, Bld: 107 mg/dL — ABNORMAL HIGH (ref 70–99)
Potassium: 3.5 mmol/L (ref 3.5–5.1)
Sodium: 136 mmol/L (ref 135–145)

## 2019-06-16 LAB — CBC
HCT: 33.2 % — ABNORMAL LOW (ref 36.0–46.0)
Hemoglobin: 11.3 g/dL — ABNORMAL LOW (ref 12.0–15.0)
MCH: 28.2 pg (ref 26.0–34.0)
MCHC: 34 g/dL (ref 30.0–36.0)
MCV: 82.8 fL (ref 80.0–100.0)
Platelets: 302 10*3/uL (ref 150–400)
RBC: 4.01 MIL/uL (ref 3.87–5.11)
RDW: 15.1 % (ref 11.5–15.5)
WBC: 7.7 10*3/uL (ref 4.0–10.5)
nRBC: 0 % (ref 0.0–0.2)

## 2019-06-16 LAB — PHOSPHORUS: Phosphorus: 4 mg/dL (ref 2.5–4.6)

## 2019-06-16 LAB — MAGNESIUM: Magnesium: 1.7 mg/dL (ref 1.7–2.4)

## 2019-06-16 MED ORDER — LIDOCAINE 5 % EX PTCH: 1.0000 | MEDICATED_PATCH | Freq: Two times a day (BID) | CUTANEOUS | 0 refills | Status: AC

## 2019-06-16 MED ORDER — MUPIROCIN 2 % EX OINT
TOPICAL_OINTMENT | Freq: Two times a day (BID) | CUTANEOUS | Status: DC
  Filled 2019-06-16: qty 22

## 2019-06-16 MED ORDER — IBUPROFEN 200 MG PO TABS: 200.0000 mg | ORAL_TABLET | Freq: Four times a day (QID) | ORAL | 0 refills | Status: AC | PRN

## 2019-06-16 MED ORDER — AMLODIPINE BESYLATE 10 MG PO TABS: 10.0000 mg | ORAL_TABLET | Freq: Every day | ORAL | 2 refills | Status: DC

## 2019-06-16 NOTE — Evaluation (Signed)
Occupational Therapy Evaluation Patient Details Name: Angel Price MRN: [id number] DOB: April 26, 1965 Today's Date: 06/16/2019    History of Present Illness pt is a 54 yo female presenting with c/o SOB and subsequently becoming unresponsive, reporting to EMS with complaints of left sided chest pressure that radiates down left arm, reports of gen fatigue and 20lb weight gain last month, recently hospitalized after presenting with SOB, several other visits with c/o of R and L sided weakness, MIR negative, CT negative, cxray clear, psychogenic cause suspected. PMH includes HTN, lupus, polysubstance abuse, anxiety, depression, malingering, abdominal wall fistulas with abscesses   Clinical Impression   Pt PTA: living with significant other and reports independence with ADL and sister assists with IADL. Pt currently performing ADL functional mobility with no AD and fair balance for ADL tasks. Pt supervisionA for ADL at commode and standing at sink for light ADL. Pt reporting tingling in L hand, but no weakness noted. Pt's sensation, WFLs in R/L with visual field blocked. Pt does not require continued OT skilled services. OT signing off.    Follow Up Recommendations  No OT follow up    Equipment Recommendations  None recommended by OT    Recommendations for Other Services       Precautions / Restrictions Precautions Precautions: Fall Restrictions Weight Bearing Restrictions: No      Mobility Bed Mobility Overal bed mobility: Modified Independent                Transfers Overall transfer level: Needs assistance Equipment used: None Transfers: Sit to/from Stand Sit to Stand: Min guard         General transfer comment: min guard for safety, able to rise from bed without AD or physical assist    Balance Overall balance assessment: History of Falls;Mild deficits observed, not formally tested                                         ADL either performed or  assessed with clinical judgement   ADL Overall ADL's : At baseline                                       General ADL Comments: Pt performing own LB ADL tasks, toileting/peri care and grooming at sink in standing. Pt ambulating in room without RW     Vision Baseline Vision/History: No visual deficits Patient Visual Report: No change from baseline Vision Assessment?: No apparent visual deficits     Perception     Praxis      Pertinent Vitals/Pain Pain Assessment: Faces Faces Pain Scale: Hurts a little bit Pain Location: Low back Pain Descriptors / Indicators: Grimacing;Discomfort Pain Intervention(s): Monitored during session     Hand Dominance Right   Extremity/Trunk Assessment Upper Extremity Assessment Upper Extremity Assessment: Overall WFL for tasks assessed   Lower Extremity Assessment Lower Extremity Assessment: Overall WFL for tasks assessed(L LE slightly weaker)   Cervical / Trunk Assessment Cervical / Trunk Assessment: Normal   Communication Communication Communication: No difficulties   Cognition Arousal/Alertness: Awake/alert Behavior During Therapy: WFL for tasks assessed/performed Overall Cognitive Status: Within Functional Limits for tasks assessed  General Comments  VSS    Exercises Other Exercises Other Exercises: pt encouraged to sit up in recliner today, pt educated on therex for strengthening and prevention of deconditioning   Shoulder Instructions      Home Living Family/patient expects to be discharged to:: Private residence Living Arrangements: Spouse/significant other Available Help at Discharge: Family Type of Home: Other(Comment)(condo) Home Access: Stairs to enter Technical brewer of Steps: 2 Entrance Stairs-Rails: Left Home Layout: Two level;1/2 bath on main level Alternate Level Stairs-Number of Steps: 12 Alternate Level Stairs-Rails: Right Bathroom  Shower/Tub: Teacher, early years/pre: Standard     Home Equipment: Environmental consultant - 2 wheels   Additional Comments: Boyfriend works and sometimes dtr works with him.      Prior Functioning/Environment Level of Independence: Needs assistance  Gait / Transfers Assistance Needed: RW for stability ADL's / Homemaking Assistance Needed: Pt performs own ADL and has assist from sister for IADL needs   Comments: pt reports several recent falls, and that she uses Rw occasionally        OT Problem List: Decreased activity tolerance      OT Treatment/Interventions:      OT Goals(Current goals can be found in the care plan section) Acute Rehab OT Goals Patient Stated Goal: go home  OT Frequency:     Barriers to D/C:            Co-evaluation              AM-PAC OT "6 Clicks" Daily Activity     Outcome Measure Help from another person eating meals?: None Help from another person taking care of personal grooming?: None Help from another person toileting, which includes using toliet, bedpan, or urinal?: None Help from another person bathing (including washing, rinsing, drying)?: A Little Help from another person to put on and taking off regular upper body clothing?: None Help from another person to put on and taking off regular lower body clothing?: None 6 Click Score: 23   End of Session Nurse Communication: Mobility status  Activity Tolerance: Patient tolerated treatment well Patient left: in bed;with call bell/phone within reach  OT Visit Diagnosis: Unsteadiness on feet (R26.81);Muscle weakness (generalized) (M62.81)                Time: [id number] OT Time Calculation (min): 14 min Charges:  OT General Charges $OT Visit: 1 Visit OT Evaluation $OT Eval Moderate Complexity: 1 Mod  Jefferey Pica, OTR/L Acute Rehabilitation Services Pager: [phone] Office: [phone]   Freedom Peddy C 06/16/2019, 1:52 PM

## 2019-06-16 NOTE — Progress Notes (Signed)
Discharge instructions given to patient and all questions answered. Daughter Caryl Pina updated as well per patient request.

## 2019-06-16 NOTE — Care Management Obs Status (Signed)
MEDICARE OBSERVATION STATUS NOTIFICATION   Patient Details  Name: Angel Price MRN: [id number] Date of Birth: Apr 04, 1965   Medicare Observation Status Notification Given:  Yes    Zenon Mayo, RN 06/16/2019, 11:25 AM

## 2019-06-16 NOTE — Evaluation (Signed)
Physical Therapy Evaluation Patient Details Name: Angel Price MRN: [id number] DOB: 09-06-1965 Today's Date: 06/16/2019   History of Present Illness  pt is a 54 yo female presenting with c/o SOB and subsequently becoming unresponsive, reporting to EMS with complaints of left sided chest pressure that radiates down left arm, reports of gen fatigue and 20lb weight gain last month, recently hospitalized after presenting with SOB, several other visits with c/o of R and L sided weakness, MIR negative, CT negative, cxray clear, psychogenic cause suspected. PMH includes HTN, lupus, polysubstance abuse, anxiety, depression, malingering, abdominal wall fistulas with abscesses  Clinical Impression  Pt is a 54 yo female admitted for above. Pt reports living in with her boyfriend and sister with someone available 24/7. Pt reports needing assistance for stair navigation, occasionally using a RW for ambulation, and that her sister does the cleaning and household chores but otherwise she is independent. Pt reports that she has been receiving HHPT. Pt mod I to get EOB and min guard for OOB mobility with periods of min A due to unsteadiness and LOB. Pt ambulated within room without AD with 2-3 instances of LOB requiring stepping response and min A from therapist to recover. Pt with LOB during vertical and horizontal head turns. Pt ambulating with slow gait speed, stride length and narrow BOS sometimes crossing her feet increasing unsteadiness. During gross strength assessment LLE slightly weaker (4-/5). Pt presents with dec strength, balance and activity tolerance limiting functional mobility. Pt would benefit from further acute PT to maximize return to PLOF. Recommendation to continue HHPT following hospital discharge to increase independence and decrease fall risk.     Follow Up Recommendations Home health PT    Equipment Recommendations  None recommended by PT    Recommendations for Other Services        Precautions / Restrictions Precautions Precautions: Fall Restrictions Weight Bearing Restrictions: No      Mobility  Bed Mobility Overal bed mobility: Modified Independent                Transfers Overall transfer level: Needs assistance Equipment used: None Transfers: Sit to/from Stand Sit to Stand: Min guard         General transfer comment: min guard for safety, able to rise from bed without AD or physical assist  Ambulation/Gait Ambulation/Gait assistance: Min guard Gait Distance (Feet): 25 Feet Assistive device: None Gait Pattern/deviations: Step-through pattern;Decreased stride length;Narrow base of support Gait velocity: dec   General Gait Details: pt ambulated back and forth within room without AD, 2-3 instances of unsteadiness and pt crossing legs requiring min A to recover, dec gait speed, pt with unsteadiness and LOB during vertical and horizontal head turns  Financial trader Rankin (Stroke Patients Only)       Balance Overall balance assessment: History of Falls;Mild deficits observed, not formally tested                                           Pertinent Vitals/Pain Pain Assessment: Faces Faces Pain Scale: Hurts a little bit Pain Location: Low back Pain Descriptors / Indicators: Grimacing;Discomfort Pain Intervention(s): Monitored during session    Home Living Family/patient expects to be discharged to:: Private residence Living Arrangements: Spouse/significant other Available Help at Discharge: Family Type of Home: Other(Comment)(condo) Home Access:  Stairs to enter Entrance Stairs-Rails: Left Entrance Stairs-Number of Steps: 2 Home Layout: Two level;1/2 bath on main level Home Equipment: Walker - 2 wheels Additional Comments: Boyfriend works and sometimes dtr works with him.    Prior Function Level of Independence: Needs assistance   Gait / Transfers Assistance Needed: RW  for stability  ADL's / Homemaking Assistance Needed: Pt performs own ADL and has assist from sister for IADL needs  Comments: pt reports several recent falls, and that she uses Rw occasionally     Hand Dominance   Dominant Hand: Right    Extremity/Trunk Assessment   Upper Extremity Assessment Upper Extremity Assessment: Overall WFL for tasks assessed    Lower Extremity Assessment Lower Extremity Assessment: Overall WFL for tasks assessed(L LE slightly weaker)    Cervical / Trunk Assessment Cervical / Trunk Assessment: Normal  Communication   Communication: No difficulties  Cognition Arousal/Alertness: Awake/alert Behavior During Therapy: WFL for tasks assessed/performed Overall Cognitive Status: Within Functional Limits for tasks assessed                                        General Comments General comments (skin integrity, edema, etc.): VSS    Exercises Other Exercises Other Exercises: pt encouraged to sit up in recliner today, pt educated on therex for strengthening and prevention of deconditioning   Assessment/Plan    PT Assessment Patient needs continued PT services  PT Problem List Decreased strength;Decreased safety awareness;Decreased mobility;Decreased range of motion;Decreased activity tolerance;Decreased balance;Decreased knowledge of use of DME;Pain       PT Treatment Interventions DME instruction;Therapeutic exercise;Gait training;Balance training;Stair training;Neuromuscular re-education;Functional mobility training;Therapeutic activities;Patient/family education    PT Goals (Current goals can be found in the Care Plan section)  Acute Rehab PT Goals Patient Stated Goal: go home PT Goal Formulation: With patient Time For Goal Achievement: 06/30/19 Potential to Achieve Goals: Good    Frequency Min 3X/week   Barriers to discharge        Co-evaluation               AM-PAC PT "6 Clicks" Mobility  Outcome Measure Help  needed turning from your back to your side while in a flat bed without using bedrails?: None Help needed moving from lying on your back to sitting on the side of a flat bed without using bedrails?: None Help needed moving to and from a bed to a chair (including a wheelchair)?: None Help needed standing up from a chair using your arms (e.g., wheelchair or bedside chair)?: A Little Help needed to walk in hospital room?: A Little Help needed climbing 3-5 steps with a railing? : A Little 6 Click Score: 21    End of Session Equipment Utilized During Treatment: Gait belt Activity Tolerance: Patient limited by fatigue;Patient tolerated treatment well Patient left: in bed;with call bell/phone within reach Nurse Communication: Mobility status PT Visit Diagnosis: Unsteadiness on feet (R26.81);History of falling (Z91.81)    Time: [id number] PT Time Calculation (min) (ACUTE ONLY): 17 min   Charges:   PT Evaluation $PT Eval Moderate Complexity: 1 Mod          Loan Oguin PT, DPT 12:09 PM,06/16/19   Eros Montour Drucilla Chalet 06/16/2019, 12:03 PM

## 2019-06-16 NOTE — Discharge Summary (Signed)
Physician Discharge Summary  [name] [id number] DOB: 10/23/1965 DOA: 06/15/2019  PCP: Rosita Fire, MD  Admit date: 06/15/2019 Discharge date: 06/16/2019  Admitted From: Home  Discharge disposition: home with home health   Recommendations for Outpatient Follow-Up:   . Follow up with your primary care provider in one week.  . Check CBC, BMP in the next visit. . Follow-up with cardiology office for Holter monitoring.  Spoke with cardiology about it . PCP to refer outpatient psychotherapy for possible conversion disorder.   Discharge Diagnosis:   Principal Problem:   Left-sided weakness Active Problems:   Tobacco abuse   Hypokalemia   Sick euthyroidism   Alcohol abuse with intoxication (HCC)   Shortness of breath   Chest pain  Discharge Condition: Improved.  Diet recommendation: Low sodium, heart healthy.    Wound care: None.  Code status: Full.   History of Present Illness:   Angel Price is a 54 y.o. female with medical history significant of hypertension, lupus, pancreatitis, chronic abdominal pain, polysubstance abuse (alcohol and cocaine), anxiety, depression, patient disorder/malingering, history of Billroth II hemigastrectomy for peptic ulcers, and  abdominal wall fistulas with abscesses  presented to hospital with complaints of shortness of breath and subsequently was found to be unresponsive. It was reported that she had been walking up a flight of stairs at and became very short of breath and went to lay down and was noted to be unresponsive.  Upon arrival of the fire department they were unable to find a pulse and perform CPR for unknown period in time.  EMS reported patient had a pulse upon their arrival.  The patient eventually became more alert and complained of left-sided chest pressure that radiates down left arm and shortness of breath.    Patient has history of daily drinking.  She also complained of abdominal wall pain from prior surgery.   Patient was just recently hospitalized from 3/3-3/6 after presenting with complaints of shortness of breath.  She had reportedly received CPR prior to arrival, was found to be positive for COVID-19 infection, found to have low TSH/free T4, and complaint of right-sided weakness.  She was evaluated by cardiology, but felt likely to be related to syncope.  Patient was to be otherwise asymptomatic from her COVID-19 diagnosis and did not receive remdesivir or steroids.  It appears that she had several visits with complaints of right and left-sided weakness.  In February patient underwent MRI which was negative for any signs of a stroke and thereafter had psychiatric consults for which a psychogenic cause was suspected her complaints of weakness.  ED Course: Upon admission into the emergency department, patient was seen to be afebrile with vital signs relatively within normal limits.  Stroke was called and patient was evaluated by neurology CT scan of the head showed no acute abnormalities.  Neurology felt exam is inconsistent with stroke and suspected conversion disorder vs malingering.  Labs significant for hemoglobin 11.7, potassium 3.4, AST 93, troponin 8->3, alcohol level 142, and D-dimer 2.92.  EKG did not reveal any ischemic findings.  Chest x-ray otherwise noted to be clear. CT angiogram of the chest did not reveal any signs of pulmonary embolism and all other findings appeared stable.  Patient had been given full dose aspirin and thiamine.  Patient was then admitted to the hospital.   Hospital Course:   Following conditions were addressed during hospitalization as listed below,  Left sided weakness:  CT scan of the brain showed no acute  abnormalities.  Neurology initially evaluated patient, but felt symptoms were inconsistent.  It appears patient has had multiple visits in the past with complaints of left or right-sided weakness which no stroke has been previously found.  Last noted to complain of  right-sided weakness in 04/2019 where MRI revealed no significant changes when compared to 2018.  Question possibility of conversion disorder versus malingering. Neurology had recommended outpatient psychotherapy for conversion disorder.  We will continue physical therapy at home on discharge.  Possible cardiac arrest, elevated D-dimer: It was documented that when patient was seen first by the fire department they were unable to find a pulse and had started CPR.  No clear details on how long CPR was performed, but patient noted her pulse upon EMS arrival.  EKG was otherwise unchanged, troponins negative, and no signs of pulmonary embolus on CT angiogram of the chest.  Similar symptoms during admission earlier this month.  Cardiology has been notified at this time and the plan is Holter monitor as outpatient.  Chest pain: Patient just recently had stress test on 3/29 which was noted to be low risk.  Troponins otherwise noted to be negative x2 with no significant EKG changes.  Patient did undergo CPR which probably contributed to her chest pain which was reproducible.  Hypokalemia.   Improved with replacement.  Check BMP as outpatient.  Alcohol abuse with acute intoxication:  alcohol level elevated at 142 presentation.  Question the possibility of obtundation from alcohol as reason for patient being unresponsive.  Counseling done.  Patient did not have any withdrawal symptoms in the hospital.  Shortness of breath, history of COVID-19 infection: Patient was positive for COVID-19 on 2/28.  She continues to complain of shortness of breath, but maintaining O2 saturations on room air.  Chest x-ray otherwise noted to be clear.  She was not previously treated with remdesivir or steroids during her last hospitalization.   Essential hypertension: Continue amlodipine 10 mg daily and atenolol 50 mg daily.  Normocytic anemia: Chronic.  Hemoglobin 11.7 which appears unchanged from patient's  baseline.  Elevated AST: AST noted to be 93 with ALT 42.  The AST to ALT ratio suggests alcohol abuse. -Continue to monitor in outpatient setting  Depression/anxiety -Continue Lexapro  Chronic abdominal wall pain: Patient is in the process of being set up for outpatient follow-up for history of abdominal fistulas and abscesses from previous history of partial gastrectomy due to peptic ulcer disease.  Patient was also seen by wound care during hospitalization.  GERD -Continue Protonix and Carafate  Disposition.  At this time, patient is stable for disposition home.  Patient will be resumed on her home health on discharge.  Medical Consultants:    Neurology  Procedures:    MRI of the brain Subjective:   Today, patient complains of mild left chest wall pain neck pain and nausea  Discharge Exam:   Vitals:   06/16/19 0611 06/16/19 0810  BP: 98/71 96/65  Pulse: 79 77  Resp: 20 20  Temp:  98.2 F (36.8 C)  SpO2: 95% 95%   Vitals:   06/16/19 0452 06/16/19 0456 06/16/19 0611 06/16/19 0810  BP: (!) 81/65  98/71 96/65  Pulse: 73  79 77  Resp: 20  20 20   Temp: 98.2 F (36.8 C)   98.2 F (36.8 C)  TempSrc: Oral   Oral  SpO2: 95%  95% 95%  Weight:  72.6 kg    Height:       Body mass index is  28.34 kg/m. General: Alert awake, not in obvious distress HENT: pupils equally reacting to light,  No scleral pallor or icterus noted. Oral mucosa is moist.  Chest:  Clear breath sounds.  Diminished breath sounds bilaterally. No crackles or wheezes. Left chest wall tenderness. CVS: S1 &S2 heard. No murmur.  Regular rate and rhythm. Abdomen: Soft, nontender, nondistended.  Bowel sounds are heard. Scar in the abdomen with a fistula. Mild nonspecific tenderness on palpation. Extremities: No cyanosis, clubbing or edema.  Peripheral pulses are palpable. Psych: Alert, awake and oriented, normal mood CNS:  No cranial nerve deficits.  Power equal in all extremities.   Skin: Warm and  dry.  No rashes noted.  The results of significant diagnostics from this hospitalization (including imaging, microbiology, ancillary and laboratory) are listed below for reference.     Diagnostic Studies:   CT Angio Chest PE W and/or Wo Contrast  Result Date: 06/15/2019 CLINICAL DATA:  Respiratory distress. EXAM: CT ANGIOGRAPHY CHEST WITH CONTRAST TECHNIQUE: Multidetector CT imaging of the chest was performed using the standard protocol during bolus administration of intravenous contrast. Multiplanar CT image reconstructions and MIPs were obtained to evaluate the vascular anatomy. CONTRAST:  11mL OMNIPAQUE IOHEXOL 350 MG/ML SOLN COMPARISON:  CT scan 05/18/2019 FINDINGS: Cardiovascular: The heart is upper limits of normal in size and appears stable. No pericardial effusion. Mild tortuosity and ectasia of the thoracic aorta and scattered atherosclerotic calcifications but no aneurysm or dissection. The branch vessels are patent. Stable three-vessel coronary artery calcifications. Mild enlargement of the pulmonary arterial trunk which may suggest pulmonary hypertension. The pulmonary arterial tree is well opacified. No filling defects to suggest pulmonary embolism. Mediastinum/Nodes: No mediastinal or hilar mass or adenopathy. Stable scattered sub 8 mm lymph nodes. The esophagus is grossly normal. Lungs/Pleura: Dependent atelectasis/edema. There is also patchy airspace opacity in the right upper lobe which is stable and likely atelectasis or residual inflammation. Stable calcified granuloma in the right middle lobe. No worrisome pulmonary lesions. No pleural effusions. Upper Abdomen: No significant upper abdominal findings. Stable surgical changes. Musculoskeletal: No breast masses, supraclavicular or axillary adenopathy. The thyroid gland is grossly normal. The bony structures are unremarkable. Review of the MIP images confirms the above findings. IMPRESSION: 1. No CT findings for pulmonary embolism. 2.  Stable mild tortuosity and ectasia of the thoracic aorta but no aneurysm or dissection. 3. Stable three-vessel coronary artery calcifications. 4. Stable patchy airspace opacity in the right upper lobe, likely atelectasis or residual inflammation. 5. No mediastinal or hilar mass or adenopathy. Aortic Atherosclerosis (ICD10-I70.0). Aortic Atherosclerosis (ICD10-I70.0). Electronically Signed   By: Marijo Sanes M.D.   On: 06/15/2019 06:22   DG Chest Portable 1 View  Result Date: 06/15/2019 CLINICAL DATA:  Chest pain and shortness of breath EXAM: PORTABLE CHEST 1 VIEW COMPARISON:  05/20/2019 FINDINGS: The heart size and mediastinal contours are within normal limits. Both lungs are clear. The visualized skeletal structures are unremarkable. IMPRESSION: No active disease. Electronically Signed   By: Ulyses Jarred M.D.   On: 06/15/2019 02:17   CT HEAD CODE STROKE WO CONTRAST  Result Date: 06/15/2019 CLINICAL DATA:  Code stroke.  Left-sided weakness EXAM: CT HEAD WITHOUT CONTRAST TECHNIQUE: Contiguous axial images were obtained from the base of the skull through the vertex without intravenous contrast. COMPARISON:  None. FINDINGS: Brain: There is no mass, hemorrhage or extra-axial collection. The size and configuration of the ventricles and extra-axial CSF spaces are normal. The brain parenchyma is normal, without evidence of acute or chronic  infarction. Vascular: No abnormal hyperdensity of the major intracranial arteries or dural venous sinuses. No intracranial atherosclerosis. Skull: The visualized skull base, calvarium and extracranial soft tissues are normal. Sinuses/Orbits: No fluid levels or advanced mucosal thickening of the visualized paranasal sinuses. No mastoid or middle ear effusion. The orbits are normal. ASPECTS [facility] Stroke Program Early CT Score) - Ganglionic level infarction (caudate, lentiform nuclei, internal capsule, insula, M1-M3 cortex): 7 - Supraganglionic infarction (M4-M6 cortex): 3  Total score (0-10 with 10 being normal): 10 IMPRESSION: 1. No acute abnormality 2. ASPECTS is 10 3. These results were communicated to Dr. Roland Rack at 2:24 am on 06/15/2019 by text page via the [facility] messaging system. Electronically Signed   By: Ulyses Jarred M.D.   On: 06/15/2019 02:25     Labs:   Basic Metabolic Panel: Recent Labs  Lab 06/15/19 0315 06/16/19 0353  NA 140 136  K 3.4* 3.5  CL 107 106  CO2 23 19*  GLUCOSE 96 107*  BUN 9 12  CREATININE 0.42* 0.60  CALCIUM 9.2 9.3  MG  --  1.7  PHOS  --  4.0   GFR Estimated Creatinine Clearance: 79.5 mL/min (by C-G formula based on SCr of 0.6 mg/dL). Liver Function Tests: Recent Labs  Lab 06/15/19 0315  AST 93*  ALT 42  ALKPHOS 65  BILITOT 0.3  PROT 6.6  ALBUMIN 3.3*   No results for input(s): LIPASE, AMYLASE in the last 168 hours. No results for input(s): AMMONIA in the last 168 hours. Coagulation profile Recent Labs  Lab 06/15/19 0243  INR 1.1    CBC: Recent Labs  Lab 06/15/19 0315 06/16/19 0353  WBC 6.0 7.7  HGB 11.7* 11.3*  HCT 33.4* 33.2*  MCV 80.9 82.8  PLT 317 302   Cardiac Enzymes: No results for input(s): CKTOTAL, CKMB, CKMBINDEX, TROPONINI in the last 168 hours. BNP: Invalid input(s): POCBNP CBG: Recent Labs  Lab 06/15/19 0213  GLUCAP 98   D-Dimer Recent Labs    06/15/19 0243  DDIMER 2.92*   Hgb A1c No results for input(s): HGBA1C in the last 72 hours. Lipid Profile No results for input(s): CHOL, HDL, LDLCALC, TRIG, CHOLHDL, LDLDIRECT in the last 72 hours. Thyroid function studies Recent Labs    06/15/19 0718  TSH 1.503   Anemia work up No results for input(s): VITAMINB12, FOLATE, FERRITIN, TIBC, IRON, RETICCTPCT in the last 72 hours. Microbiology No results found for this or any previous visit (from the past 240 hour(s)).   Discharge Instructions:   Discharge Instructions    Diet - low sodium heart healthy   Complete by: As directed    Discharge instructions    Complete by: As directed    Follow up with your primary care provider in 1 week. Warm compression to the left chest wall in the neck 2-3 times a day. Continue wound care at home and follow-up with your surgeon about your abdominal wall wound.. Resume physical therapy at home for your weakness.. Please do not drink alcohol.   Increase activity slowly   Complete by: As directed      Allergies as of 06/16/2019      Reactions   Codeine Hives   Morphine And Related Hives   Reglan [metoclopramide] Other (See Comments)   EPS symptoms    Reglan [metoclopramide] Other (See Comments)   EPS symptoms       Medication List    TAKE these medications   acetaminophen 500 MG tablet Commonly known as: TYLENOL  Take 2 tablets (1,000 mg total) by mouth 3 (three) times daily as needed for mild pain or moderate pain.   amLODipine 10 MG tablet Commonly known as: NORVASC Take 1 tablet (10 mg total) by mouth daily.   aspirin 81 MG EC tablet Take 1 tablet (81 mg total) by mouth daily.   atenolol 50 MG tablet Commonly known as: TENORMIN Take 50 mg by mouth daily.   dicyclomine 10 MG capsule Commonly known as: Bentyl Take 1 capsule (10 mg total) by mouth 4 (four) times daily -  before meals and at bedtime. For diarrhea and abdominal cramps   diphenhydrAMINE 25 MG tablet Commonly known as: BENADRYL Take 25 mg by mouth every 6 (six) hours as needed for sleep.   escitalopram 20 MG tablet Commonly known as: LEXAPRO Take 20 mg daily by mouth.   folic acid 1 MG tablet Commonly known as: FOLVITE Take 1 tablet (1 mg total) by mouth daily.   ibuprofen 200 MG tablet Commonly known as: Motrin IB Take 1 tablet (200 mg total) by mouth every 6 (six) hours as needed for up to 10 days.   lidocaine 5 % Commonly known as: Lidoderm Place 1 patch onto the skin every 12 (twelve) hours for 5 days. Remove & Discard patch within 12 hours or as directed by MD. Left chest wall   nicotine 14 mg/24hr  patch Commonly known as: NICODERM CQ - dosed in mg/24 hours Place 1 patch (14 mg total) onto the skin daily.   ondansetron 4 MG tablet Commonly known as: ZOFRAN Take 1 tablet (4 mg total) by mouth every 6 (six) hours as needed for nausea.   pantoprazole 40 MG tablet Commonly known as: PROTONIX Take 40 mg by mouth daily.   promethazine 25 MG tablet Commonly known as: PHENERGAN Take 25 mg by mouth every 6 (six) hours as needed for nausea or vomiting.   promethazine 25 MG suppository Commonly known as: PHENERGAN Place 1 suppository (25 mg total) rectally every 6 (six) hours as needed for nausea or vomiting.   sucralfate 1 g tablet Commonly known as: CARAFATE Take 1 g by mouth 4 (four) times daily -  with meals and at bedtime.   thiamine 100 MG tablet Take 1 tablet (100 mg total) by mouth daily.   vitamin B-12 1000 MCG tablet Commonly known as: CYANOCOBALAMIN Take 2,000 mcg by mouth daily.       Time coordinating discharge: 39 minutes  Signed:  Sam Overbeck  [facility] 06/16/2019, 12:14 PM

## 2019-06-16 NOTE — TOC Progression Note (Signed)
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 NCM confirmed this with Butch Penny with [facility].  NCM informed Butch Penny that patient is for potential dc today.  NCM asked patient is she wanted substance abuse resources , she stated no.        Expected Discharge Plan and Services                                                 Social Determinants of Health (SDOH) Interventions    Readmission Risk Interventions Readmission Risk Prevention Plan 02/25/2019  Transportation Screening Complete  PCP or Specialist Appt within 3-5 Days Not Complete  HRI or [place] Complete  Social Work Consult for [place] Planning/Counseling Complete  Palliative Care Screening Not Complete  Medication Review [employer]) Complete  Some recent data might be hidden

## 2019-06-16 NOTE — Consult Note (Signed)
[place] Nurse Consult Note: Reason for Consult: Four (4) fistulae on abdomen.  Patient known to our team from previous consultations. Patient was last seen at the outpatient [facility] by Dr. Dellia Nims last Friday, on 06/11/19 and his note indicates that there is no role for outpatient [facility] with this surgical chronic wound. Patient has been evaluated by surgery and it has been determined and communicated to patient that further surgeries should be undertaken at a tertiary medical center.  Patient has expressed that she will be having surgery at [facility]) in the future. Wound type: fistula Pressure Injury POA: N/A Measurement: Four open and draining fistulae: one in the RUQ, 2 in the midline lower right abdomen and 1 in the midline left lower abdomen. Wound bed: N/A Drainage (amount, consistency, odor): Small amount of yellow exudate from RUQ, the others are moist with serous exudate. Patient expresses abdominal discomfort. Periwound: intact, dry. Dressing procedure/placement/frequency: I will continue with the POC in place from other providers and will give Nursing guidance for cleansing followed by application of mupirocin ointment and topped with dry dressings. Patient is encouraged to follow up with the providers at [facility] when able.  [facility] nursing team will not follow, but will remain available to this patient, the nursing and medical teams.  Please re-consult if needed. Thanks, Maudie Flakes, MSN, RN, [place], Arther Abbott  Pager# [phone]

## 2019-06-17 ENCOUNTER — Other Ambulatory Visit: Payer: Self-pay

## 2019-06-17 ENCOUNTER — Emergency Department (HOSPITAL_COMMUNITY)
Admission: EM | Admit: 2019-06-17 | Discharge: 2019-06-17 | Disposition: A | Discharge status: Home / Self Care | Payer: Medicare Other | Attending: Emergency Medicine | Admitting: Emergency Medicine

## 2019-06-17 ENCOUNTER — Encounter (HOSPITAL_COMMUNITY): Payer: Self-pay | Admitting: Emergency Medicine

## 2019-06-17 DIAGNOSIS — Z79899 Other long term (current) drug therapy: Secondary | ICD-10-CM | POA: Insufficient documentation

## 2019-06-17 DIAGNOSIS — Z7982 Long term (current) use of aspirin: Secondary | ICD-10-CM | POA: Diagnosis not present

## 2019-06-17 DIAGNOSIS — I10 Essential (primary) hypertension: Secondary | ICD-10-CM | POA: Diagnosis not present

## 2019-06-17 DIAGNOSIS — Z8616 Personal history of COVID-19: Secondary | ICD-10-CM | POA: Insufficient documentation

## 2019-06-17 DIAGNOSIS — F17210 Nicotine dependence, cigarettes, uncomplicated: Secondary | ICD-10-CM | POA: Diagnosis not present

## 2019-06-17 DIAGNOSIS — E039 Hypothyroidism, unspecified: Secondary | ICD-10-CM | POA: Diagnosis not present

## 2019-06-17 DIAGNOSIS — L03311 Cellulitis of abdominal wall: Secondary | ICD-10-CM

## 2019-06-17 DIAGNOSIS — R1084 Generalized abdominal pain: Secondary | ICD-10-CM | POA: Diagnosis present

## 2019-06-17 LAB — COMPREHENSIVE METABOLIC PANEL
ALT: 38 U/L (ref 0–44)
AST: 40 U/L (ref 15–41)
Albumin: 3.3 g/dL — ABNORMAL LOW (ref 3.5–5.0)
Alkaline Phosphatase: 69 U/L (ref 38–126)
Anion gap: 10 (ref 5–15)
BUN: 11 mg/dL (ref 6–20)
CO2: 23 mmol/L (ref 22–32)
Calcium: 9.4 mg/dL (ref 8.9–10.3)
Chloride: 108 mmol/L (ref 98–111)
Creatinine, Ser: 0.53 mg/dL (ref 0.44–1.00)
GFR calc Af Amer: >60 mL/min (ref >60)
GFR calc non Af Amer: >60 mL/min (ref >60)
Glucose, Bld: 116 mg/dL — ABNORMAL HIGH (ref 70–99)
Potassium: 3.8 mmol/L (ref 3.5–5.1)
Sodium: 141 mmol/L (ref 135–145)
Total Bilirubin: 0.6 mg/dL (ref 0.3–1.2)
Total Protein: 6.7 g/dL (ref 6.5–8.1)

## 2019-06-17 LAB — CBC
HCT: 34.1 % — ABNORMAL LOW (ref 36.0–46.0)
Hemoglobin: 11.7 g/dL — ABNORMAL LOW (ref 12.0–15.0)
MCH: 28.5 pg (ref 26.0–34.0)
MCHC: 34.3 g/dL (ref 30.0–36.0)
MCV: 83.2 fL (ref 80.0–100.0)
Platelets: 302 10*3/uL (ref 150–400)
RBC: 4.1 MIL/uL (ref 3.87–5.11)
RDW: 15.1 % (ref 11.5–15.5)
WBC: 6 10*3/uL (ref 4.0–10.5)
nRBC: 0 % (ref 0.0–0.2)

## 2019-06-17 MED ORDER — DOXYCYCLINE HYCLATE 100 MG PO TABS
100.0000 mg | ORAL_TABLET | Freq: Once | ORAL | Status: AC
  Administered 2019-06-17: 100 mg via ORAL
  Filled 2019-06-17: qty 1

## 2019-06-17 MED ORDER — DOXYCYCLINE HYCLATE 100 MG PO CAPS: 100.0000 mg | ORAL_CAPSULE | Freq: Two times a day (BID) | ORAL | 0 refills | Status: DC

## 2019-06-17 MED ORDER — TRAMADOL HCL 50 MG PO TABS
50.0000 mg | ORAL_TABLET | Freq: Once | ORAL | Status: AC
  Administered 2019-06-17: 06:00:00 50 mg via ORAL
  Filled 2019-06-17: qty 1

## 2019-06-17 MED ORDER — PROMETHAZINE HCL 25 MG/ML IJ SOLN
25.0000 mg | Freq: Once | INTRAMUSCULAR | Status: AC
  Administered 2019-06-17: 25 mg via INTRAMUSCULAR
  Filled 2019-06-17: qty 1

## 2019-06-17 MED ORDER — SODIUM CHLORIDE 0.9% FLUSH: 3.0000 mL | Freq: Once | INTRAVENOUS | Status: DC

## 2019-06-17 NOTE — Discharge Instructions (Addendum)
You have a mild skin infection of the abdominal wall. You will need to take Doxycycline twice daily for the next 10 days. Continue your regular medications as prescribed, for nausea and pain.   Follow up with your doctor in 2 days for recheck.

## 2019-06-17 NOTE — ED Provider Notes (Signed)
[facility] EMERGENCY DEPARTMENT Provider Note   CSN: [id number] Arrival date & time: 06/17/19  0433     History Chief Complaint  Patient presents with  . Abdominal Pain    Angel Price is a 54 y.o. female.  Patient to ED with complaint of abdominal pain, nausea and vomiting. Patient with history of gastritis, gastric ulcers s/p hemigastrectomy, chronic incisional infection, polysubstance abuse, diverticulosis, drug-seeking behavior, chronic abdominal pain, chronic nausea/vomiting, lupus, malingering, pancreatitis. She reports a fever at home, Tmax "99". She reports last bowel movement this morning and was loose.   The history is provided by the patient. No language interpreter was used.  Abdominal Pain Associated symptoms: nausea and vomiting   Associated symptoms: no chest pain, no chills, no dysuria, no fever and no shortness of breath        Past Medical History:  Diagnosis Date  . Abscess    soft tissue  . Adrenal mass ([place])   . Alcohol abuse   . Anxiety   . Blood transfusion without reported diagnosis   . Chronic abdominal pain   . Chronic wound infection of abdomen   . Colon polyp    colonoscopy 04/2014  . Depression   . Diverticulosis    colonoscopy 04/2014 moderat pan colonic  . Drug-seeking behavior   . Gastritis    EGD 05/2014  . Gastroparesis Nov 2015  . GERD (gastroesophageal reflux disease)   . Hemorrhoid    internal large  . Hiatal hernia   . History of Billroth II operation   . Hypertension   . Lung nodule    CT 02/2014 needs repeat 1 month  . Lung nodule < 6cm on CT 04/25/2014  . Lupus ([place])   . Malingering   . Nausea and vomiting    chronic, recurrent  . Pancreatitis   . Schatzki's ring    patent per EGD 04/2014  . Sickle cell trait ([place])   . Suicide attempt ([place])   . 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 Shortness of breath 06/15/2019  . Chest pain  06/15/2019  . Acute respiratory failure with hypoxia ([place]) 05/20/2019  . Essential hypertension 05/20/2019  . COVID-19 05/20/2019  . Syncope 05/20/2019  . Right sided weakness 05/16/2019  . Metabolic acidosis 0000000  . Intractable vomiting with nausea 02/23/2019  . Abnormal CT scan, colon 12/09/2018  . Abnormal computed tomography of cecum and terminal ileum 12/09/2018  . Cocaine abuse ([place])   . Colitis 11/11/2018  . Hemorrhoidal skin tag   . SBO (small bowel obstruction) ([place])   . Non-intractable vomiting with nausea   . Folate deficiency 05/11/2017  . Abdominal wall fistula   . Cellulitis of abdominal wall 01/02/2017  . Acute left hemiparesis ([place]) 12/05/2016  . Malingering 12/05/2016  . Weakness of left lower extremity 12/02/2016  . CVA (cerebral vascular accident) ([place]) 11/23/2016  . Bright red rectal bleeding 08/22/2016  . Hypotension due to blood loss   . Acute GI bleeding 05/24/2016  . Dieulafoy lesion of duodenum   . History of Billroth II operation   . Gastrointestinal hemorrhage 05/22/2016  . Absolute anemia   . Acute pancreatitis 10/01/2015  . Abnormal CT scan of lung 10/01/2015  . Alcohol abuse with intoxication ([place])   . Left-sided weakness   . Psychosomatic factor in physical condition   . Upper GI bleed   . Diverticulosis of colon with  hemorrhage   . Chronic wound infection of abdomen 12/22/2014  . 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  . HTN (hypertension) 12/22/2014  . Ataxia 11/01/2014  . Hemorrhoids 04/26/2014  . Lung nodule 04/26/2014  . Diverticulosis   . Gastritis   . Hiatal hernia   . Schatzki's ring   . Acute blood loss anemia 04/25/2014  . Sinus tachycardia 04/25/2014  . Hypokalemia 04/25/2014  . Hematemesis with nausea 04/25/2014  . Lung nodule < 6cm on CT 04/25/2014  . Intractable nausea and vomiting 04/24/2014  . Gastroparesis   . Abdominal pain 04/01/2014  . Chronic abdominal pain 01/21/2014  . Gastroenteritis 12/10/2013  . Chronic  abdominal wound infection 08/16/2013  . MDD (major depressive disorder), recurrent episode, severe ([place]) 06/27/2013  . Wrist laceration 06/24/2013  . Diarrhea 05/07/2013  . Rectal bleeding 05/07/2013  . Abnormal LFTs 05/07/2013  . Adrenal mass, left ([place]) 05/07/2013  . Abdominal wall abscess at site of surgical wound 04/19/2013  . Abdominal wall abscess 04/18/2013  . Frequent headaches 03/30/2013  . Sleep difficulties 03/30/2013  . Essential hypertension, benign 03/30/2013  . History of cocaine abuse ([place] ) 03/16/2013  . History of schizoaffective disorder 03/16/2013  . Bipolar disorder ([place]) 03/16/2013  . Personality disorder ([place]) 03/16/2013  . Tobacco abuse 03/16/2013  . Alcohol abuse 03/16/2013  . Palpitations 03/16/2013  . Poor vision 03/16/2013  . History of gastric bypass 03/16/2013  . Status post hysterectomy with oophorectomy 03/16/2013  . Hypothyroid 03/16/2013  . Lupus ([place]) 03/16/2013    Past Surgical History:  Procedure Laterality Date  . ABDOMINAL HYSTERECTOMY  2013   [place]  . ABDOMINAL HYSTERECTOMY    . ABDOMINAL SURGERY    . ADRENALECTOMY Right   . AGILE CAPSULE N/A 01/05/2015   Procedure: AGILE CAPSULE;  Surgeon: Daneil Dolin, MD;  Location: [facility];  Service: Endoscopy;  Laterality: N/A;  0700  . Billroth II procedure      [place], first 2000, 2005/2006.  . bilroth 2    . BIOPSY  05/20/2013   Procedure: BIOPSIES OF ASCENDING AND SIGMOID COLON;  Surgeon: Daneil Dolin, MD;  Location: [facility];  Service: Endoscopy;;  . BIOPSY  04/26/2014   Procedure: BIOPSIES;  Surgeon: Danie Binder, MD;  Location: [facility];  Service: Endoscopy;;  . BIOPSY  12/24/2018   Procedure: BIOPSY;  Surgeon: Daneil Dolin, MD;  Location: [facility];  Service: Endoscopy;;  colon  . CHOLECYSTECTOMY    . COLONOSCOPY     in [place]  . COLONOSCOPY WITH PROPOFOL N/A 05/20/2013   Dr.Rourk- inadequate prep, normal appearing rectum, grossly normal colon aside from pancolonic  diverticula, normal terminal ileum bx= unremarkable colonic mucosa. Due for early interval 2016.   Marland Kitchen COLONOSCOPY WITH PROPOFOL N/A 04/26/2014   [id number] ileum/one colon polyp removed/moderate pan-colonic diverticulosis/large internal hemorrhoids  . COLONOSCOPY WITH PROPOFOL N/A 12/23/2014   Dr.Rourk- minimal internal hemorrhoids, pancolonic diverticulosis  . COLONOSCOPY WITH PROPOFOL N/A 08/23/2016   Procedure: COLONOSCOPY WITH PROPOFOL;  Surgeon: Danie Binder, MD;  [facility];  Service: Endoscopy;  Laterality: N/A;  . COLONOSCOPY WITH PROPOFOL N/A 12/24/2018   Pancolonic diverticulosis, normal TI, one 5 mm polyp in rectum (tubular adenoma). Somewhat friable hemorrhagic mucosa in area of IC valve/cecum, possibly related to scope trauma s/p biopsy.   . DEBRIDEMENT OF ABDOMINAL WALL ABSCESS N/A 02/08/2013   Procedure: DEBRIDEMENT OF ABDOMINAL WALL ABSCESS;  Surgeon: Jamesetta So, MD;  Location: [facility];  Service: General;  Laterality: N/A;  .  ESOPHAGOGASTRODUODENOSCOPY (EGD) WITH PROPOFOL N/A 05/20/2013   Dr.Rourk- s/p prior gastric surgery with normal esophagus, residual gastric mucosa and patent efferent limb  . ESOPHAGOGASTRODUODENOSCOPY (EGD) WITH PROPOFOL N/A 02/03/2014   Dr. Gala Romney:  s/p hemigastrectomy with retained gastric contents. Residual gastric mucosa and efferent limb appeared normal otherwise. Query gastroparesis.   Marland Kitchen ESOPHAGOGASTRODUODENOSCOPY (EGD) WITH PROPOFOL N/A 04/26/2014   [id number] ring/small HH/mild non-erosive gasrtitis/normal anastomosis  . ESOPHAGOGASTRODUODENOSCOPY (EGD) WITH PROPOFOL N/A 12/23/2014   Dr.Rourk- s/p prior hemigastrctomy, active oozing from anastomotic suture site, hemostasis achieved  . ESOPHAGOGASTRODUODENOSCOPY (EGD) WITH PROPOFOL N/A 05/23/2016   Dr. 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 ESOPHAGOGASTRODUODENOSCOPY (EGD) WITH PROPOFOL N/A  05/11/2017   Procedure: ESOPHAGOGASTRODUODENOSCOPY (EGD) WITH PROPOFOL;  Surgeon: Daneil Dolin, MD;  [facility];  Service: Endoscopy;  Laterality: N/A;  . HEMORRHOID SURGERY N/A 06/18/2017   Procedure: THREE COLUMN EXTENSIVE HEMORRHOIDECTOMY;  Surgeon: Virl Cagey, MD;  Location: [facility];  Service: General;  Laterality: N/A;  . INCISION AND DRAINAGE ABSCESS N/A 01/06/2017   Procedure: INCISION AND DRAINAGE ABDOMINAL WALL ABSCESS;  Surgeon: Aviva Signs, MD;  Location: [facility];  Service: General;  Laterality: N/A;  . POLYPECTOMY  12/24/2018   Procedure: POLYPECTOMY;  Surgeon: Daneil Dolin, MD;  [facility];  Service: Endoscopy;;  colon  . tendon repar Right    wrist  . WOUND EXPLORATION Right 06/24/2013   Procedure: exploration of traumatic wound right wrist;  Surgeon: Tennis Must, MD;  Location: [facility];  Service: Orthopedics;  Laterality: Right;     OB History    Gravida  4   Para  4   Term  4   Preterm  0   AB  0   Living        SAB  0   TAB  0   Ectopic  0   Multiple      Live Births              Family History  Problem Relation Age of Onset  . Lung cancer Father   . Brain cancer Son   . Schizophrenia Son   . Cancer Son        brain  . Cancer Father        mets  . Drug abuse Mother   . Breast cancer Maternal Aunt   . Bipolar disorder Maternal Aunt   . Drug abuse Maternal Aunt   . Colon cancer Maternal Grandmother        late 36s, early 77s  . Drug abuse Sister   . Drug abuse Brother   . Bipolar disorder Paternal Grandfather   . Bipolar disorder Cousin   . Liver disease Neg Hx     Social History   Tobacco Use  . Smoking status: Current Every Day Smoker    Packs/day: 0.50    Years: 35.00    Pack years: 17.50    Types: Cigarettes  . Smokeless tobacco: Never Used  Substance Use Topics  . Alcohol use: Yes  . Drug use: Not Currently    Types: Cocaine    Home Medications Prior to Admission medications    Medication Sig Start Date End Date Taking? Authorizing Provider  acetaminophen (TYLENOL) 500 MG tablet Take 2 tablets (1,000 mg total) by mouth 3 (three) times daily as needed for mild pain or moderate pain. 02/26/19   Billie Ruddy,  Otila Kluver, MD  amLODipine (NORVASC) 10 MG tablet Take 1 tablet (10 mg total) by mouth daily. 06/16/19   Pokhrel, Corrie Mckusick, MD  aspirin EC 81 MG EC tablet Take 1 tablet (81 mg total) by mouth daily. 05/19/19   Elgergawy, Silver Huguenin, MD  atenolol (TENORMIN) 50 MG tablet Take 50 mg by mouth daily. 02/01/19   [provider]  dicyclomine (BENTYL) 10 MG capsule Take 1 capsule (10 mg total) by mouth 4 (four) times daily -  before meals and at bedtime. For diarrhea and abdominal cramps 12/09/18   Mahala Menghini, PA-C  diphenhydrAMINE (BENADRYL) 25 MG tablet Take 25 mg by mouth every 6 (six) hours as needed for sleep.    [provider]  escitalopram (LEXAPRO) 20 MG tablet Take 20 mg daily by mouth.    [provider]  folic acid (FOLVITE) 1 MG tablet Take 1 tablet (1 mg total) by mouth daily. 11/13/18   Roxan Hockey, MD  ibuprofen (MOTRIN IB) 200 MG tablet Take 1 tablet (200 mg total) by mouth every 6 (six) hours as needed for up to 10 days. 06/16/19 06/26/19  Pokhrel, Corrie Mckusick, MD  lidocaine (LIDODERM) 5 % Place 1 patch onto the skin every 12 (twelve) hours for 5 days. Remove & Discard patch within 12 hours or as directed by MD. Left chest wall 06/16/19 06/21/19  Pokhrel, Corrie Mckusick, MD  nicotine (NICODERM CQ - DOSED IN MG/24 HOURS) 14 mg/24hr patch Place 1 patch (14 mg total) onto the skin daily. Patient not taking: Reported on 06/15/2019 05/19/19   Elgergawy, Silver Huguenin, MD  ondansetron (ZOFRAN) 4 MG tablet Take 1 tablet (4 mg total) by mouth every 6 (six) hours as needed for nausea. [id number]   Delora Fuel, MD  pantoprazole (PROTONIX) 40 MG tablet Take 40 mg by mouth daily. 04/29/19   [provider]  promethazine (PHENERGAN) 25 MG suppository Place 1 suppository (25 mg  total) rectally every 6 (six) hours as needed for nausea or vomiting. 01/27/19   McDonald, Mia A, PA-C  promethazine (PHENERGAN) 25 MG tablet Take 25 mg by mouth every 6 (six) hours as needed for nausea or vomiting.    [provider]  sucralfate (CARAFATE) 1 g tablet Take 1 g by mouth 4 (four) times daily -  with meals and at bedtime.    [provider]  thiamine 100 MG tablet Take 1 tablet (100 mg total) by mouth daily. Patient not taking: Reported on 06/15/2019 11/13/18   Roxan Hockey, MD  vitamin B-12 (CYANOCOBALAMIN) 1000 MCG tablet Take 2,000 mcg by mouth daily.     [provider]    Allergies    Codeine, Morphine and related, Reglan [metoclopramide], and Reglan [metoclopramide]  Review of Systems   Review of Systems  Constitutional: Negative for chills and fever.  HENT: Negative.   Respiratory: Negative.  Negative for shortness of breath.   Cardiovascular: Negative.  Negative for chest pain.  Gastrointestinal: Positive for abdominal pain, nausea and vomiting.  Genitourinary: Negative for dysuria.  Musculoskeletal: Negative.  Negative for back pain.  Skin: Negative.   Neurological: Negative.     Physical Exam Updated Vital Signs BP 109/88 (BP Location: Left Arm)   Pulse 72   Temp 98.4 F (36.9 C) (Oral)   Resp 17   Ht 5\' 6"  (1.676 m)   Wt 70 kg   LMP  (LMP Unknown)   SpO2 99%   BMI 24.91 kg/m   Physical Exam Vitals and nursing note reviewed.  Constitutional:      Appearance: She is well-developed.  HENT:     Head: Normocephalic.  Cardiovascular:     Rate and Rhythm: Normal rate and regular rhythm.  Pulmonary:     Effort: Pulmonary effort is normal.     Breath sounds: Normal breath sounds.  Abdominal:     General: Bowel sounds are normal.     Palpations: Abdomen is soft.     Tenderness: There is abdominal tenderness. There is no guarding or rebound.     Comments: There are multiple surgical scars to abdominal wall. There are 2  areas of purulent drainage in epigastric and right of the umbilicus that are chronic. There is an area to the left of the umbilicus that is mildly erythematous. No induration or fluctuance. No draining wound. Tenderness is diffuse throughout the abdomen.  Musculoskeletal:        General: Normal range of motion.     Cervical back: Normal range of motion and neck supple.  Skin:    General: Skin is warm and dry.     Findings: No rash.  Neurological:     Mental Status: She is alert.     Cranial Nerves: No cranial nerve deficit.     ED Results / Procedures / Treatments   Labs (all labs ordered are listed, but only abnormal results are displayed) Labs Reviewed  COMPREHENSIVE METABOLIC PANEL  CBC    EKG None  Radiology CT Angio Chest PE W and/or Wo Contrast  Result Date: 06/15/2019 CLINICAL DATA:  Respiratory distress. EXAM: CT ANGIOGRAPHY CHEST WITH CONTRAST TECHNIQUE: Multidetector CT imaging of the chest was performed using the standard protocol during bolus administration of intravenous contrast. Multiplanar CT image reconstructions and MIPs were obtained to evaluate the vascular anatomy. CONTRAST:  66mL OMNIPAQUE IOHEXOL 350 MG/ML SOLN COMPARISON:  CT scan 05/18/2019 FINDINGS: Cardiovascular: The heart is upper limits of normal in size and appears stable. No pericardial effusion. Mild tortuosity and ectasia of the thoracic aorta and scattered atherosclerotic calcifications but no aneurysm or dissection. The branch vessels are patent. Stable three-vessel coronary artery calcifications. Mild enlargement of the pulmonary arterial trunk which may suggest pulmonary hypertension. The pulmonary arterial tree is well opacified. No filling defects to suggest pulmonary embolism. Mediastinum/Nodes: No mediastinal or hilar mass or adenopathy. Stable scattered sub 8 mm lymph nodes. The esophagus is grossly normal. Lungs/Pleura: Dependent atelectasis/edema. There is also patchy airspace opacity in the  right upper lobe which is stable and likely atelectasis or residual inflammation. Stable calcified granuloma in the right middle lobe. No worrisome pulmonary lesions. No pleural effusions. Upper Abdomen: No significant upper abdominal findings. Stable surgical changes. Musculoskeletal: No breast masses, supraclavicular or axillary adenopathy. The thyroid gland is grossly normal. The bony structures are unremarkable. Review of the MIP images confirms the above findings. IMPRESSION: 1. No CT findings for pulmonary embolism. 2. Stable mild tortuosity and ectasia of the thoracic aorta but no aneurysm or dissection. 3. Stable three-vessel coronary artery calcifications. 4. Stable patchy airspace opacity in the right upper lobe, likely atelectasis or residual inflammation. 5. No mediastinal or hilar mass or adenopathy. Aortic Atherosclerosis (ICD10-I70.0). Aortic Atherosclerosis (ICD10-I70.0). Electronically Signed   By: Marijo Sanes M.D.   On: 06/15/2019 06:22    Procedures Procedures (including critical care time)  Medications Ordered in ED Medications  sodium chloride flush (NS) 0.9 % injection 3 mL (has no administration in time range)  promethazine (PHENERGAN) injection 25 mg (has no administration in time range)  traMADol (ULTRAM) tablet 50 mg (has no administration in time range)    ED Course  I have reviewed the triage vital signs and the nursing notes.  Pertinent labs & imaging results that were available during my care of the patient were reviewed by me and considered in my medical decision making (see chart for details).    MDM Rules/Calculators/A&P                      Patient to ED for evaluation of abdominal pain that started yesterday. Note: patient was just discharged from the hospital yesterday at noon. She reports having a fever, but reports Tmax as 99.   She c/o N, V. She has chronic issues with this and it is not felt to be related to current complaint. There does appear to be  an area of cellulitic changes on the left abdomen but no evidence abscess. No leukocytosis.   IM Phenergan and a Tramadol was provided in the ED. Will start on Doxycycline. Will recommend PCP recheck in 2 days.   Final Clinical Impression(s) / ED Diagnoses Final diagnoses:  None   1. Cellulitis, abdominal wall  Rx / DC Orders ED Discharge Orders    None       Charlann Lange, PA-C 06/17/19 [place], April, MD 06/17/19 [phone]

## 2019-06-25 ENCOUNTER — Other Ambulatory Visit: Payer: Self-pay

## 2019-06-25 ENCOUNTER — Ambulatory Visit: Payer: Medicare Other | Admitting: Cardiology

## 2019-06-25 ENCOUNTER — Ambulatory Visit: Payer: Medicare Other

## 2019-06-25 ENCOUNTER — Encounter: Payer: Self-pay | Admitting: Cardiology

## 2019-06-25 VITALS — BP 114/82 | HR 115 | Temp 97.5°F | Resp 16 | Ht 63.0 in | Wt 142.8 lb

## 2019-06-25 DIAGNOSIS — R55 Syncope and collapse: Secondary | ICD-10-CM | POA: Diagnosis not present

## 2019-06-25 DIAGNOSIS — I25110 Atherosclerotic heart disease of native coronary artery with unstable angina pectoris: Secondary | ICD-10-CM | POA: Insufficient documentation

## 2019-06-25 DIAGNOSIS — I25118 Atherosclerotic heart disease of native coronary artery with other forms of angina pectoris: Secondary | ICD-10-CM | POA: Insufficient documentation

## 2019-06-25 DIAGNOSIS — I2510 Atherosclerotic heart disease of native coronary artery without angina pectoris: Secondary | ICD-10-CM

## 2019-06-25 MED ORDER — ROSUVASTATIN CALCIUM 20 MG PO TABS: 20.0000 mg | ORAL_TABLET | Freq: Every day | ORAL | 3 refills | Status: DC

## 2019-06-25 NOTE — Progress Notes (Signed)
Follow up visit  Subjective:   Angel Price, female    DOB: 05/14/1965, 54 y.o.   MRN: [id number]   HPI  Chief Complaint  Patient presents with  . Results    Stress test  . Follow-up    54 y.o. African female with hypertension, tobacco and alcohol dependence, CAD without angina, recurrent episodes of loss of consciousness.  Patient has been hospitalized twice in last 6 weeks with complaints of loss of consciousness and questionable cardiac arrest.  Both times, patient reportedly had episodes of loss of consciousness, and underwent CPR by first responders, but had pulse present when EMS arrived.  At least 1 of these occasions, patient actually remembers chest compressions being performed.  One of these episodes happened while patient was Covid positive, fortunately without any severe illness related to it.  Both times, she has had elevated alcohol level.  She has also had complaints of unilateral weakness with CT head and MRI being unremarkable.  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   Given left main and triple-vessel calcifications noted, she underwent nuclear stress test which did not show any ischemia.  Echocardiogram showed structurally normal heart without any significant abnormalities.  Patient continues to have constant chest pain throughout the day, only improved when she sleeps at night.  Patient states that she has cut down her alcohol intake to about 1 beer per day.  She smokes 5 cigarettes/day.  In the past, patient used to have a sixpack every day.  She endorses having had too much to drink when she found out that her godmother passed off of it.  This is about the time she was admitted to the hospital and found to have alcohol level elevated.  Current Outpatient Medications on File Prior to Visit  Medication Sig Dispense Refill  . acetaminophen (TYLENOL) 500 MG tablet Take 2 tablets (1,000 mg total) by mouth 3 (three) times daily as needed for  mild pain or moderate pain. 30 tablet 0  . amLODipine (NORVASC) 10 MG tablet Take 1 tablet (10 mg total) by mouth daily. 30 tablet 2  . aspirin EC 81 MG EC tablet Take 1 tablet (81 mg total) by mouth daily. 30 tablet 0  . atenolol (TENORMIN) 50 MG tablet Take 50 mg by mouth daily.    Marland Kitchen dicyclomine (BENTYL) 10 MG capsule Take 1 capsule (10 mg total) by mouth 4 (four) times daily -  before meals and at bedtime. For diarrhea and abdominal cramps 120 capsule 1  . diphenhydrAMINE (BENADRYL) 25 MG tablet Take 25 mg by mouth every 6 (six) hours as needed for sleep.    Marland Kitchen doxycycline (VIBRAMYCIN) 100 MG capsule Take 1 capsule (100 mg total) by mouth 2 (two) times daily. 20 capsule 0  . escitalopram (LEXAPRO) 20 MG tablet Take 20 mg daily by mouth.    . folic acid (FOLVITE) 1 MG tablet Take 1 tablet (1 mg total) by mouth daily. 30 tablet 2  . ibuprofen (MOTRIN IB) 200 MG tablet Take 1 tablet (200 mg total) by mouth every 6 (six) hours as needed for up to 10 days. 40 tablet 0  . nicotine (NICODERM CQ - DOSED IN MG/24 HOURS) 14 mg/24hr patch Place 1 patch (14 mg total) onto the skin daily. 28 patch 0  . ondansetron (ZOFRAN) 4 MG tablet Take 1 tablet (4 mg total) by mouth every 6 (six) hours as needed for nausea. 30 tablet 0  . pantoprazole (PROTONIX) 40 MG tablet  Take 40 mg by mouth daily.    . promethazine (PHENERGAN) 25 MG suppository Place 1 suppository (25 mg total) rectally every 6 (six) hours as needed for nausea or vomiting. 12 each 0  . promethazine (PHENERGAN) 25 MG tablet Take 25 mg by mouth every 6 (six) hours as needed for nausea or vomiting.    . sucralfate (CARAFATE) 1 g tablet Take 1 g by mouth 4 (four) times daily -  with meals and at bedtime.    . thiamine 100 MG tablet Take 1 tablet (100 mg total) by mouth daily. 30 tablet 2  . vitamin B-12 (CYANOCOBALAMIN) 1000 MCG tablet Take 2,000 mcg by mouth daily.      No current facility-administered medications on file prior to visit.     Cardiovascular & other pertient studies:  EKG 06/25/2019: Sinus tachycardia 120 bpm.  Diffuse ST depression  -Nondiagnostic.  ABNORMAL   CT Chest 06/15/2019: 1. No CT findings for pulmonary embolism. 2. Stable mild tortuosity and ectasia of the thoracic aorta but no aneurysm or dissection. 3. Stable three-vessel coronary artery calcifications. 4. Stable patchy airspace opacity in the right upper lobe, likely atelectasis or residual inflammation. 5. No mediastinal or hilar mass or adenopathy.  Lexiscan Tetrofosmin stress test 06/14/2019: No previous exam available for comparison. Lexiscan nuclear stress test performed using 1-day protocol. Stress symptoms included chest pain, dyspnea, dizziness. Stress EKG is non-diagnostic, as this is pharmacological stress test. In additional, stress EKG shows sinus tachycardia at 83% MPHR, no ischemic changes. Myocardial perfusion imaging is normal. Stress LVEF 72% with normal myocardial thickening. Low risk study.   Echocardiogram 05/20/2019: 1. Left ventricular ejection fraction, by estimation, is 65 to 70%. The  left ventricle has hyperdynamic function. The left ventricle has no  regional wall motion abnormalities. Left ventricular diastolic parameters  are consistent with Grade I diastolic  dysfunction (impaired relaxation).  2. Right ventricular systolic function is normal. The right ventricular  size is normal.  3. The mitral valve is normal in structure and function. No evidence of  mitral valve regurgitation. No evidence of mitral stenosis.  4. The aortic valve is normal in structure and function. Aortic valve  regurgitation is not visualized. No aortic stenosis is present.  5. The inferior vena cava is normal in size with greater than 50%  respiratory variability, suggesting right atrial pressure of 3 mmHg.   Recent labs: 06/17/2019: Glucose 116, BUN/Cr 11/0.53. EGFR normal. Na/K 141/3.8. Albumin 3.3. Rest of the CMP normal H/H  11.7/34.1. MCV 83. Platelets 302 TG 270  2018: Chol 165, TG 166, HDL 33, LDL 99 TSH 1.5 normal    Review of Systems  Cardiovascular: Positive for syncope. Negative for chest pain, dyspnea on exertion, leg swelling and palpitations.         Vitals:   06/25/19 0840  BP: 114/82  Pulse: (!) 115  Resp: 16  Temp: (!) 97.5 F (36.4 C)  SpO2: 96%     Body mass index is 25.3 kg/m. Filed Weights   06/25/19 0840  Weight: 142 lb 12.8 oz (64.8 kg)     Objective:   Physical Exam  Constitutional: She appears well-developed and well-nourished.  Neck: No JVD present.  Cardiovascular: Regular rhythm, normal heart sounds and intact distal pulses. Tachycardia present.  No murmur heard. Pulmonary/Chest: Effort normal and breath sounds normal. She has no wheezes. She has no rales.  Musculoskeletal:        General: No edema.  Nursing note and vitals reviewed.           Assessment & Recommendations:   54 y.o. African female with hypertension, tobacco and alcohol dependence, CAD without angina, recurrent episodes of loss of consciousness.  History and timeline at both episodes is highly suspicious for a true cardiac arrest.  It is highly unlikely that patient return to baseline mental status immediately after 10 minutes of CPR.  She has clear recollection of "someone pressing on her chest" during the CPR, which goes against pulseless status. She has no evidence of myocardial ischemia on EKG, multiple high-sensitivity troponin levels, and echocardiogram.  EKG has not showed any arrhythmia.  I do not think patient had either acute coronary syndrome or acute arrhythmia, based on history and her work-up.  Her episode was most likely a combination of COVID +ve status, dehydration, alcohol intoxication, probable vasovagal syncope leading to hypotension and perhaps thready pulse.  Her chest pain episodes are unlikely to be anginal in nature. However, she does have stable coronary  atherosclerosis without ischemia on stress test.  Recommend Aspirin 81 mg, high intensity statin-crestor 20 mg daily.  Recommend ROCT 14 day monitoring. I suspect her tachycardia could be related to anxiety. Continue atenolol for now.  F/u in 6 months   Maja Mccaffery Esther Hardy, MD [facility] [place] Pager: [phone] Office: [phone]

## 2019-06-29 ENCOUNTER — Encounter: Payer: Medicare Other | Admitting: Physical Medicine and Rehabilitation

## 2019-06-29 DIAGNOSIS — L988 Other specified disorders of the skin and subcutaneous tissue: Secondary | ICD-10-CM | POA: Diagnosis not present

## 2019-06-29 DIAGNOSIS — I10 Essential (primary) hypertension: Secondary | ICD-10-CM | POA: Diagnosis not present

## 2019-06-29 DIAGNOSIS — Z720 Tobacco use: Secondary | ICD-10-CM | POA: Diagnosis not present

## 2019-06-29 DIAGNOSIS — M329 Systemic lupus erythematosus, unspecified: Secondary | ICD-10-CM | POA: Diagnosis not present

## 2019-07-01 DIAGNOSIS — S31100D Unspecified open wound of abdominal wall, right upper quadrant without penetration into peritoneal cavity, subsequent encounter: Secondary | ICD-10-CM | POA: Diagnosis not present

## 2019-07-01 DIAGNOSIS — J1282 Pneumonia due to coronavirus disease 2019: Secondary | ICD-10-CM | POA: Diagnosis not present

## 2019-07-01 DIAGNOSIS — I10 Essential (primary) hypertension: Secondary | ICD-10-CM | POA: Diagnosis not present

## 2019-07-01 DIAGNOSIS — R55 Syncope and collapse: Secondary | ICD-10-CM | POA: Diagnosis not present

## 2019-07-09 DIAGNOSIS — R55 Syncope and collapse: Secondary | ICD-10-CM | POA: Diagnosis not present

## 2019-07-12 ENCOUNTER — Emergency Department (HOSPITAL_COMMUNITY): Payer: Medicare Other

## 2019-07-12 ENCOUNTER — Encounter (HOSPITAL_COMMUNITY): Payer: Self-pay

## 2019-07-12 ENCOUNTER — Emergency Department (HOSPITAL_COMMUNITY)
Admission: EM | Admit: 2019-07-12 | Discharge: 2019-07-12 | Disposition: A | Discharge status: Home / Self Care | Payer: Medicare Other | Attending: Emergency Medicine | Admitting: Emergency Medicine

## 2019-07-12 ENCOUNTER — Encounter (HOSPITAL_COMMUNITY): Payer: Self-pay | Admitting: Emergency Medicine

## 2019-07-12 ENCOUNTER — Emergency Department (HOSPITAL_COMMUNITY)
Admission: EM | Admit: 2019-07-12 | Discharge: 2019-07-13 | Disposition: A | Discharge status: Home / Self Care | Payer: Medicare Other | Source: Home / Self Care | Attending: Emergency Medicine | Admitting: Emergency Medicine

## 2019-07-12 ENCOUNTER — Other Ambulatory Visit: Payer: Self-pay

## 2019-07-12 DIAGNOSIS — R109 Unspecified abdominal pain: Secondary | ICD-10-CM | POA: Insufficient documentation

## 2019-07-12 DIAGNOSIS — Z5321 Procedure and treatment not carried out due to patient leaving prior to being seen by health care provider: Secondary | ICD-10-CM | POA: Insufficient documentation

## 2019-07-12 DIAGNOSIS — Z8673 Personal history of transient ischemic attack (TIA), and cerebral infarction without residual deficits: Secondary | ICD-10-CM | POA: Insufficient documentation

## 2019-07-12 DIAGNOSIS — Z8616 Personal history of COVID-19: Secondary | ICD-10-CM | POA: Insufficient documentation

## 2019-07-12 DIAGNOSIS — K632 Fistula of intestine: Secondary | ICD-10-CM | POA: Insufficient documentation

## 2019-07-12 DIAGNOSIS — I2510 Atherosclerotic heart disease of native coronary artery without angina pectoris: Secondary | ICD-10-CM | POA: Insufficient documentation

## 2019-07-12 DIAGNOSIS — E039 Hypothyroidism, unspecified: Secondary | ICD-10-CM | POA: Insufficient documentation

## 2019-07-12 DIAGNOSIS — K6389 Other specified diseases of intestine: Secondary | ICD-10-CM | POA: Diagnosis not present

## 2019-07-12 DIAGNOSIS — I10 Essential (primary) hypertension: Secondary | ICD-10-CM | POA: Insufficient documentation

## 2019-07-12 DIAGNOSIS — F17210 Nicotine dependence, cigarettes, uncomplicated: Secondary | ICD-10-CM | POA: Insufficient documentation

## 2019-07-12 DIAGNOSIS — Z7982 Long term (current) use of aspirin: Secondary | ICD-10-CM | POA: Insufficient documentation

## 2019-07-12 DIAGNOSIS — Z79899 Other long term (current) drug therapy: Secondary | ICD-10-CM | POA: Insufficient documentation

## 2019-07-12 LAB — CBC
HCT: 36.4 % (ref 36.0–46.0)
Hemoglobin: 12.6 g/dL (ref 12.0–15.0)
MCH: 28.7 pg (ref 26.0–34.0)
MCHC: 34.6 g/dL (ref 30.0–36.0)
MCV: 82.9 fL (ref 80.0–100.0)
Platelets: 374 10*3/uL (ref 150–400)
RBC: 4.39 MIL/uL (ref 3.87–5.11)
RDW: 15.1 % (ref 11.5–15.5)
WBC: 8.1 10*3/uL (ref 4.0–10.5)
nRBC: 0 % (ref 0.0–0.2)

## 2019-07-12 LAB — COMPREHENSIVE METABOLIC PANEL
ALT: 70 U/L — ABNORMAL HIGH (ref 0–44)
AST: 38 U/L (ref 15–41)
Albumin: 3.7 g/dL (ref 3.5–5.0)
Alkaline Phosphatase: 99 U/L (ref 38–126)
Anion gap: 11 (ref 5–15)
BUN: 11 mg/dL (ref 6–20)
CO2: 24 mmol/L (ref 22–32)
Calcium: 9.4 mg/dL (ref 8.9–10.3)
Chloride: 106 mmol/L (ref 98–111)
Creatinine, Ser: 0.78 mg/dL (ref 0.44–1.00)
GFR calc Af Amer: >60 mL/min (ref >60)
GFR calc non Af Amer: >60 mL/min (ref >60)
Glucose, Bld: 83 mg/dL (ref 70–99)
Potassium: 3.8 mmol/L (ref 3.5–5.1)
Sodium: 141 mmol/L (ref 135–145)
Total Bilirubin: 0.2 mg/dL — ABNORMAL LOW (ref 0.3–1.2)
Total Protein: 7.6 g/dL (ref 6.5–8.1)

## 2019-07-12 LAB — I-STAT BETA HCG BLOOD, ED (MC, WL, AP ONLY): I-stat hCG, quantitative: <5 m[IU]/mL (ref <5)

## 2019-07-12 LAB — LIPASE, BLOOD: Lipase: 19 U/L (ref 11–51)

## 2019-07-12 LAB — LACTIC ACID, PLASMA: Lactic Acid, Venous: 1.4 mmol/L (ref 0.5–1.9)

## 2019-07-12 MED ORDER — SODIUM CHLORIDE 0.9% FLUSH: 3.0000 mL | Freq: Once | INTRAVENOUS | Status: DC

## 2019-07-12 MED ORDER — HYDROMORPHONE HCL 1 MG/ML IJ SOLN
1.0000 mg | Freq: Once | INTRAMUSCULAR | Status: AC
  Administered 2019-07-12: 1 mg via INTRAVENOUS
  Filled 2019-07-12: qty 1

## 2019-07-12 MED ORDER — SODIUM CHLORIDE (PF) 0.9 % IJ SOLN
INTRAMUSCULAR | Status: AC
  Filled 2019-07-12: qty 50

## 2019-07-12 MED ORDER — PROMETHAZINE HCL 25 MG/ML IJ SOLN
25.0000 mg | Freq: Once | INTRAMUSCULAR | Status: AC
  Administered 2019-07-12: 25 mg via INTRAVENOUS
  Filled 2019-07-12: qty 1

## 2019-07-12 MED ORDER — IOHEXOL 300 MG/ML  SOLN
100.0000 mL | Freq: Once | INTRAMUSCULAR | Status: AC | PRN
  Administered 2019-07-12: 100 mL via INTRAVENOUS

## 2019-07-12 MED ORDER — SODIUM CHLORIDE 0.9 % IV BOLUS
1000.0000 mL | Freq: Once | INTRAVENOUS | Status: AC
  Administered 2019-07-12: 1000 mL via INTRAVENOUS

## 2019-07-12 NOTE — ED Triage Notes (Signed)
Patient c/ N/ and abdominal pain to the LLQ. Patient has a softball sized area and states there is yellow pus coming from it. Area red and warm to touch.

## 2019-07-12 NOTE — ED Notes (Signed)
Pt has multiple open lesions on the abdominal cavity, that are draining purulent fluid; mass on left lower quadrant that appears erythematous and warm to touch, pt experiencing diffuse abdominal tenderness upon palpation

## 2019-07-12 NOTE — ED Notes (Signed)
Pt stating she wants to LWBS, advised it would be best to wait and see provider. Pt decided to leave

## 2019-07-12 NOTE — ED Provider Notes (Signed)
[facility] DEPT Provider Note   CSN: [id number] Arrival date & time: 07/12/19  1354     History Chief Complaint  Patient presents with  . Abdominal Pain    Angel Price is a 54 y.o. female history of gastroparesis, gastrectomy for peptic ulcers.  Abdominal wall fistulas and abscesses here presenting with abdominal pain and draining from the wound. Patient states that for the last 2 to 3 days, she noticed worsening drainage from her abdominal wounds.  Patient states that she has increased pain as well.  Patient states that the drainage is purulent and she denies any fevers or chills.  Patient was seen several weeks ago for similar symptoms and was treated with a course of doxycycline.  Patient was here earlier and had normal CBC and CMP and lactate.   The history is provided by the patient.       Past Medical History:  Diagnosis Date  . Abscess    soft tissue  . Adrenal mass ([place])   . Alcohol abuse   . Anxiety   . Blood transfusion without reported diagnosis   . Chronic abdominal pain   . Chronic wound infection of abdomen   . Colon polyp    colonoscopy 04/2014  . Depression   . Diverticulosis    colonoscopy 04/2014 moderat pan colonic  . Drug-seeking behavior   . Gastritis    EGD 05/2014  . Gastroparesis Nov 2015  . GERD (gastroesophageal reflux disease)   . Hemorrhoid    internal large  . Hiatal hernia   . History of Billroth II operation   . Hypertension   . Lung nodule    CT 02/2014 needs repeat 1 month  . Lung nodule < 6cm on CT 04/25/2014  . Lupus ([place])   . Malingering   . Nausea and vomiting    chronic, recurrent  . Pancreatitis   . Schatzki's ring    patent per EGD 04/2014  . Sickle cell trait ([place])   . Suicide attempt ([place])   . 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 Coronary artery disease involving native coronary artery of native heart without angina  pectoris 06/25/2019  . Shortness of breath 06/15/2019  . Chest pain 06/15/2019  . Acute respiratory failure with hypoxia ([place]) 05/20/2019  . Essential hypertension 05/20/2019  . COVID-19 05/20/2019  . Syncope and collapse 05/20/2019  . Right sided weakness 05/16/2019  . Metabolic acidosis 0000000  . Intractable vomiting with nausea 02/23/2019  . Abnormal CT scan, colon 12/09/2018  . Abnormal computed tomography of cecum and terminal ileum 12/09/2018  . Cocaine abuse ([place])   . Colitis 11/11/2018  . Hemorrhoidal skin tag   . SBO (small bowel obstruction) ([place])   . Non-intractable vomiting with nausea   . Folate deficiency 05/11/2017  . Abdominal wall fistula   . Cellulitis of abdominal wall 01/02/2017  . Acute left hemiparesis ([place]) 12/05/2016  . Malingering 12/05/2016  . Weakness of left lower extremity 12/02/2016  . CVA (cerebral vascular accident) ([place]) 11/23/2016  . Bright red rectal bleeding 08/22/2016  . Hypotension due to blood loss   . Acute GI bleeding 05/24/2016  . Dieulafoy lesion of duodenum   . History of Billroth II operation   . Gastrointestinal hemorrhage 05/22/2016  . Absolute anemia   . Acute pancreatitis 10/01/2015  . Abnormal CT scan of lung 10/01/2015  . Alcohol abuse with intoxication (  [place])   . Left-sided weakness   . Psychosomatic factor in physical condition   . Upper GI bleed   . Diverticulosis of colon with hemorrhage   . Chronic wound infection of abdomen 12/22/2014  . 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  . HTN (hypertension) 12/22/2014  . Ataxia 11/01/2014  . Hemorrhoids 04/26/2014  . Lung nodule 04/26/2014  . Diverticulosis   . Gastritis   . Hiatal hernia   . Schatzki's ring   . Acute blood loss anemia 04/25/2014  . Sinus tachycardia 04/25/2014  . Hypokalemia 04/25/2014  . Hematemesis with nausea 04/25/2014  . Lung nodule < 6cm on CT 04/25/2014  . Intractable nausea and vomiting 04/24/2014  . Gastroparesis   . Abdominal pain 04/01/2014  .  Chronic abdominal pain 01/21/2014  . Gastroenteritis 12/10/2013  . Chronic abdominal wound infection 08/16/2013  . MDD (major depressive disorder), recurrent episode, severe ([place]) 06/27/2013  . Wrist laceration 06/24/2013  . Diarrhea 05/07/2013  . Rectal bleeding 05/07/2013  . Abnormal LFTs 05/07/2013  . Adrenal mass, left ([place]) 05/07/2013  . Abdominal wall abscess at site of surgical wound 04/19/2013  . Abdominal wall abscess 04/18/2013  . Frequent headaches 03/30/2013  . Sleep difficulties 03/30/2013  . Essential hypertension, benign 03/30/2013  . History of cocaine abuse ([place]) 03/16/2013  . History of schizoaffective disorder 03/16/2013  . Bipolar disorder ([place]) 03/16/2013  . Personality disorder ([place]) 03/16/2013  . Tobacco abuse 03/16/2013  . Alcohol abuse 03/16/2013  . Palpitations 03/16/2013  . Poor vision 03/16/2013  . History of gastric bypass 03/16/2013  . Status post hysterectomy with oophorectomy 03/16/2013  . Hypothyroid 03/16/2013  . Lupus ([place]) 03/16/2013    Past Surgical History:  Procedure Laterality Date  . ABDOMINAL HYSTERECTOMY  2013   [place]  . ABDOMINAL HYSTERECTOMY    . ABDOMINAL SURGERY    . ADRENALECTOMY Right   . AGILE CAPSULE N/A 01/05/2015   Procedure: AGILE CAPSULE;  Surgeon: Daneil Dolin, MD;  Location[facility];  Service: Endoscopy;  Laterality: N/A;  0700  . Billroth II procedure      [place], first 2000, 2005/2006.  . bilroth 2    . BIOPSY  05/20/2013   Procedure: BIOPSIES OF ASCENDING AND SIGMOID COLON;  Surgeon: Daneil Dolin, MD;  Location: [facility];  Service: Endoscopy;;  . BIOPSY  04/26/2014   Procedure: BIOPSIES;  Surgeon: Danie Binder, MD;  Location: [facility];  Service: Endoscopy;;  . BIOPSY  12/24/2018   Procedure: BIOPSY;  Surgeon: Daneil Dolin, MD;  [facility];  Service: Endoscopy;;  colon  . CHOLECYSTECTOMY    . COLONOSCOPY     in [place]  . COLONOSCOPY WITH PROPOFOL N/A 05/20/2013   Dr.Rourk- inadequate  prep, normal appearing rectum, grossly normal colon aside from pancolonic diverticula, normal terminal ileum bx= unremarkable colonic mucosa. Due for early interval 2016.   Marland Kitchen COLONOSCOPY WITH PROPOFOL N/A 04/26/2014   [id number] ileum/one colon polyp removed/moderate pan-colonic diverticulosis/large internal hemorrhoids  . COLONOSCOPY WITH PROPOFOL N/A 12/23/2014   Dr.Rourk- minimal internal hemorrhoids, pancolonic diverticulosis  . COLONOSCOPY WITH PROPOFOL N/A 08/23/2016   Procedure: COLONOSCOPY WITH PROPOFOL;  Surgeon: Danie Binder, MD;  [facility];  Service: Endoscopy;  Laterality: N/A;  . COLONOSCOPY WITH PROPOFOL N/A 12/24/2018   Pancolonic diverticulosis, normal TI, one 5 mm polyp in rectum (tubular adenoma). Somewhat friable hemorrhagic mucosa in area of IC valve/cecum, possibly related to scope trauma s/p biopsy.   . DEBRIDEMENT OF ABDOMINAL WALL  ABSCESS N/A 02/08/2013   Procedure: DEBRIDEMENT OF ABDOMINAL WALL ABSCESS;  Surgeon: Jamesetta So, MD;  Location: [facility];  Service: General;  Laterality: N/A;  . ESOPHAGOGASTRODUODENOSCOPY (EGD) WITH PROPOFOL N/A 05/20/2013   Dr.Rourk- s/p prior gastric surgery with normal esophagus, residual gastric mucosa and patent efferent limb  . ESOPHAGOGASTRODUODENOSCOPY (EGD) WITH PROPOFOL N/A 02/03/2014   Dr. Gala Romney:  s/p hemigastrectomy with retained gastric contents. Residual gastric mucosa and efferent limb appeared normal otherwise. Query gastroparesis.   Marland Kitchen ESOPHAGOGASTRODUODENOSCOPY (EGD) WITH PROPOFOL N/A 04/26/2014   [id number] ring/small HH/mild non-erosive gasrtitis/normal anastomosis  . ESOPHAGOGASTRODUODENOSCOPY (EGD) WITH PROPOFOL N/A 12/23/2014   Dr.Rourk- s/p prior hemigastrctomy, active oozing from anastomotic suture site, hemostasis achieved  . ESOPHAGOGASTRODUODENOSCOPY (EGD) WITH PROPOFOL N/A 05/23/2016   Dr. Oneida Alar while inpatient: red blood at anastomosis, s/p epi injection and clips, likely secondary to Dieulafoy's lesion at  anastomosis   . ESOPHAGOGASTRODUODENOSCOPY (EGD) WITH PROPOFOL N/A 05/11/2017   Procedure: ESOPHAGOGASTRODUODENOSCOPY (EGD) WITH PROPOFOL;  Surgeon: Daneil Dolin, MD;  [facility];  Service: Endoscopy;  Laterality: N/A;  . HEMORRHOID SURGERY N/A 06/18/2017   Procedure: THREE COLUMN EXTENSIVE HEMORRHOIDECTOMY;  Surgeon: Virl Cagey, MD;  Location: [facility];  Service: General;  Laterality: N/A;  . INCISION AND DRAINAGE ABSCESS N/A 01/06/2017   Procedure: INCISION AND DRAINAGE ABDOMINAL WALL ABSCESS;  Surgeon: Aviva Signs, MD;  Location: [facility];  Service: General;  Laterality: N/A;  . POLYPECTOMY  12/24/2018   Procedure: POLYPECTOMY;  Surgeon: Daneil Dolin, MD;  [facility];  Service: Endoscopy;;  colon  . tendon repar Right    wrist  . WOUND EXPLORATION Right 06/24/2013   Procedure: exploration of traumatic wound right wrist;  Surgeon: Tennis Must, MD;  Location: [facility];  Service: Orthopedics;  Laterality: Right;     OB History    Gravida  4   Para  4   Term  4   Preterm  0   AB  0   Living        SAB  0   TAB  0   Ectopic  0   Multiple      Live Births              Family History  Problem Relation Age of Onset  . Lung cancer Father   . Cancer Father        mets  . Brain cancer Son   . Schizophrenia Son   . Cancer Son        brain  . Drug abuse Mother   . Breast cancer Maternal Aunt   . Bipolar disorder Maternal Aunt   . Drug abuse Maternal Aunt   . Colon cancer Maternal Grandmother        late 63s, early 74s  . Drug abuse Sister   . Drug abuse Brother   . Bipolar disorder Paternal Grandfather   . Bipolar disorder Cousin   . Liver disease Neg Hx     Social History   Tobacco Use  . Smoking status: Current Every Day Smoker    Packs/day: 0.50    Years: 35.00    Pack years: 17.50    Types: Cigarettes  . Smokeless tobacco: Never Used  Substance Use Topics  . Alcohol use: Not Currently  . Drug use: Not Currently     Types: Cocaine    Home Medications Prior to Admission medications   Medication Sig Start Date End Date Taking? Authorizing Provider  acetaminophen (TYLENOL) 500 MG tablet Take 2 tablets (1,000 mg total) by mouth 3 (three) times daily as needed for mild pain or moderate pain. 02/26/19   Enzo Bi, MD  amLODipine (NORVASC) 10 MG tablet Take 1 tablet (10 mg total) by mouth daily. 06/16/19   Pokhrel, Corrie Mckusick, MD  aspirin EC 81 MG EC tablet Take 1 tablet (81 mg total) by mouth daily. 05/19/19   Elgergawy, Silver Huguenin, MD  atenolol (TENORMIN) 50 MG tablet Take 50 mg by mouth daily. 02/01/19   [provider]  dicyclomine (BENTYL) 10 MG capsule Take 1 capsule (10 mg total) by mouth 4 (four) times daily -  before meals and at bedtime. For diarrhea and abdominal cramps 12/09/18   Mahala Menghini, PA-C  diphenhydrAMINE (BENADRYL) 25 MG tablet Take 25 mg by mouth every 6 (six) hours as needed for sleep.    [provider]  doxycycline (VIBRAMYCIN) 100 MG capsule Take 1 capsule (100 mg total) by mouth 2 (two) times daily. 06/17/19   Charlann Lange, PA-C  escitalopram (LEXAPRO) 20 MG tablet Take 20 mg daily by mouth.    [provider]  folic acid (FOLVITE) 1 MG tablet Take 1 tablet (1 mg total) by mouth daily. 11/13/18   Roxan Hockey, MD  nicotine (NICODERM CQ - DOSED IN MG/24 HOURS) 14 mg/24hr patch Place 1 patch (14 mg total) onto the skin daily. 05/19/19   Elgergawy, Silver Huguenin, MD  ondansetron (ZOFRAN) 4 MG tablet Take 1 tablet (4 mg total) by mouth every 6 (six) hours as needed for nausea. [id number]   Delora Fuel, MD  pantoprazole (PROTONIX) 40 MG tablet Take 40 mg by mouth daily. 04/29/19   [provider]  promethazine (PHENERGAN) 25 MG suppository Place 1 suppository (25 mg total) rectally every 6 (six) hours as needed for nausea or vomiting. 01/27/19   McDonald, Mia A, PA-C  promethazine (PHENERGAN) 25 MG tablet Take 25 mg by mouth every 6 (six) hours as needed for nausea or  vomiting.    [provider]  rosuvastatin (CRESTOR) 20 MG tablet Take 1 tablet (20 mg total) by mouth daily. 06/25/19 09/23/19  Patwardhan, Reynold Bowen, MD  sucralfate (CARAFATE) 1 g tablet Take 1 g by mouth 4 (four) times daily -  with meals and at bedtime.    [provider]  thiamine 100 MG tablet Take 1 tablet (100 mg total) by mouth daily. 11/13/18   Roxan Hockey, MD  vitamin B-12 (CYANOCOBALAMIN) 1000 MCG tablet Take 2,000 mcg by mouth daily.     [provider]    Allergies    Codeine, Morphine and related, Reglan [metoclopramide], and Reglan [metoclopramide]  Review of Systems   Review of Systems  Gastrointestinal: Positive for abdominal pain.  All other systems reviewed and are negative.   Physical Exam Updated Vital Signs BP (!) 134/97 (BP Location: Left Arm)   Pulse 79   Temp 98.1 F (36.7 C) (Oral)   Resp 16   Ht 5\' 3"  (1.6 m)   Wt 64.8 kg   LMP  (LMP Unknown)   SpO2 98%   BMI 25.31 kg/m   Physical Exam Vitals and nursing note reviewed.  Constitutional:      Comments: Chronically ill, uncomfortable   HENT:     Head: Normocephalic.     Mouth/Throat:     Mouth: Mucous membranes are moist.  Eyes:     Extraocular Movements: Extraocular movements intact.  Cardiovascular:     Rate and  Rhythm: Normal rate and regular rhythm.     Heart sounds: Normal heart sounds.  Pulmonary:     Effort: Pulmonary effort is normal.     Breath sounds: Normal breath sounds.  Abdominal:     General: Abdomen is flat.     Comments: Complicated abdomen with multiple scars.  She does have some purulent drainage on the peri umbilical area.  She has some swelling on the left side of the abdomen.  Mild diffuse tenderness.  Skin:    General: Skin is warm.     Capillary Refill: Capillary refill takes less than 2 seconds.  Neurological:     General: No focal deficit present.  Psychiatric:        Mood and Affect: Mood normal.        Behavior: Behavior normal.      ED Results / Procedures / Treatments   Labs (all labs ordered are listed, but only abnormal results are displayed) Labs Reviewed - No data to display  EKG None  Radiology No results found.  Procedures Procedures (including critical care time)  Angiocath insertion Performed by: Wandra Arthurs  Consent: Verbal consent obtained. Risks and benefits: risks, benefits and alternatives were discussed Time out: Immediately prior to procedure a "time out" was called to verify the correct patient, procedure, equipment, support staff and site/side marked as required.  Preparation: Patient was prepped and draped in the usual sterile fashion.  Vein Location: L antecube  Ultrasound Guided  Gauge: 20 long   Normal blood return and flush without difficulty Patient tolerance: Patient tolerated the procedure well with no immediate complications.     Medications Ordered in ED Medications  sodium chloride (PF) 0.9 % injection (has no administration in time range)  iohexol (OMNIPAQUE) 300 MG/ML solution 100 mL (has no administration in time range)  sodium chloride 0.9 % bolus 1,000 mL (1,000 mLs Intravenous New Bag/Given (Non-Interop) 07/12/19 2213)  HYDROmorphone (DILAUDID) injection 1 mg (1 mg Intravenous Given 07/12/19 2214)  promethazine (PHENERGAN) injection 25 mg (25 mg Intravenous Given 07/12/19 2243)    ED Course  I have reviewed the triage vital signs and the nursing notes.  Pertinent labs & imaging results that were available during my care of the patient were reviewed by me and considered in my medical decision making (see chart for details).    MDM Rules/Calculators/A&P                      Angel Price is a 54 y.o. female here presenting with abdominal pain and drainage from her abdominal wound.  She has complicated abdominal wounds with previous fistulas.  She is afebrile in the ED and his her white blood cell count is normal .  Will get CT abdomen pelvis to further  assess.  12:21 AM Patient CT showed chronic complex fluid collection that is stable.  Given that she has no leukocytosis and lactate is normal and has no fever, I think this is a chronic problem.  I reviewed her chart from [facility] 2 weeks ago. She saw surgery there and they did not put her on antibiotics.  She was seen in the ED a month ago and was put on doxycycline and states that that has not helped.  Patient did have previous drug abuse and alcohol abuse.  I am not quite sure why she wanted evaluation from multiple doctors for her chronic issue.  I told her that she needs to follow-up with her  GI surgeon at [facility].  We will hold off on antibiotics right now since she does not meet SIRS criteria and it has not helped in the past.  Gave strict return precautions and will not give her any pain medicine to go home with.   Final Clinical Impression(s) / ED Diagnoses Final diagnoses:  None    Rx / DC Orders ED Discharge Orders    None       Drenda Freeze, MD 07/13/19 [phone]

## 2019-07-12 NOTE — ED Triage Notes (Signed)
Pt in w/abdominal pain, emesis x few days. States 2 days ago, she noticed pinhole sized bump to LLQ, then it started to swell and drain yellow pus. Area now size of softball, warm to touch. Also c/o diarrhea, no fevers

## 2019-07-13 ENCOUNTER — Encounter: Payer: Medicare Other | Admitting: Physical Medicine and Rehabilitation

## 2019-07-13 DIAGNOSIS — K6389 Other specified diseases of intestine: Secondary | ICD-10-CM | POA: Diagnosis not present

## 2019-07-13 DIAGNOSIS — Z720 Tobacco use: Secondary | ICD-10-CM | POA: Diagnosis not present

## 2019-07-13 DIAGNOSIS — I10 Essential (primary) hypertension: Secondary | ICD-10-CM | POA: Diagnosis not present

## 2019-07-13 DIAGNOSIS — L02211 Cutaneous abscess of abdominal wall: Secondary | ICD-10-CM | POA: Diagnosis not present

## 2019-07-13 DIAGNOSIS — L988 Other specified disorders of the skin and subcutaneous tissue: Secondary | ICD-10-CM | POA: Diagnosis not present

## 2019-07-13 DIAGNOSIS — M329 Systemic lupus erythematosus, unspecified: Secondary | ICD-10-CM | POA: Diagnosis not present

## 2019-07-13 MED ORDER — VANCOMYCIN HCL IN DEXTROSE 1-5 GM/200ML-% IV SOLN: 1000.0000 mg | Freq: Once | INTRAVENOUS | Status: DC

## 2019-07-13 NOTE — Discharge Instructions (Signed)
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  There is no acute infection going on based on your labs and CT scan today.  Continue Phenergan as needed for vomiting as prescribed by your doctor.  You have a complex case and will need to see the GI surgeon that you have seen already.  Return to ER if you have worsening drainage, fever, vomiting, worse abdominal swelling and distention.

## 2019-07-17 LAB — CULTURE, BLOOD (ROUTINE X 2): Culture: NO GROWTH

## 2019-07-31 DIAGNOSIS — L930 Discoid lupus erythematosus: Secondary | ICD-10-CM | POA: Diagnosis not present

## 2019-07-31 DIAGNOSIS — K2100 Gastro-esophageal reflux disease with esophagitis, without bleeding: Secondary | ICD-10-CM | POA: Diagnosis not present

## 2019-08-10 DIAGNOSIS — L988 Other specified disorders of the skin and subcutaneous tissue: Secondary | ICD-10-CM | POA: Diagnosis not present

## 2019-08-10 DIAGNOSIS — S31109D Unspecified open wound of abdominal wall, unspecified quadrant without penetration into peritoneal cavity, subsequent encounter: Secondary | ICD-10-CM | POA: Diagnosis not present

## 2019-08-10 DIAGNOSIS — M329 Systemic lupus erythematosus, unspecified: Secondary | ICD-10-CM | POA: Diagnosis not present

## 2019-08-17 HISTORY — PX: EXCISION OF MESH: SHX6268

## 2019-09-03 ENCOUNTER — Encounter: Payer: Medicare Other | Admitting: Physical Medicine and Rehabilitation

## 2019-09-06 DIAGNOSIS — Z01812 Encounter for preprocedural laboratory examination: Secondary | ICD-10-CM | POA: Diagnosis not present

## 2019-09-06 DIAGNOSIS — S31109D Unspecified open wound of abdominal wall, unspecified quadrant without penetration into peritoneal cavity, subsequent encounter: Secondary | ICD-10-CM | POA: Diagnosis not present

## 2019-09-10 DIAGNOSIS — K432 Incisional hernia without obstruction or gangrene: Secondary | ICD-10-CM | POA: Diagnosis not present

## 2019-09-10 DIAGNOSIS — T8579XA Infection and inflammatory reaction due to other internal prosthetic devices, implants and grafts, initial encounter: Secondary | ICD-10-CM | POA: Diagnosis not present

## 2019-09-10 DIAGNOSIS — T83729A Exposure of other prosthetic materials into organ or tissue, initial encounter: Secondary | ICD-10-CM | POA: Diagnosis not present

## 2019-09-10 DIAGNOSIS — Z87891 Personal history of nicotine dependence: Secondary | ICD-10-CM | POA: Diagnosis not present

## 2019-09-10 DIAGNOSIS — Z833 Family history of diabetes mellitus: Secondary | ICD-10-CM | POA: Diagnosis not present

## 2019-09-10 DIAGNOSIS — K567 Ileus, unspecified: Secondary | ICD-10-CM | POA: Diagnosis not present

## 2019-09-10 DIAGNOSIS — R410 Disorientation, unspecified: Secondary | ICD-10-CM | POA: Diagnosis not present

## 2019-09-10 DIAGNOSIS — E079 Disorder of thyroid, unspecified: Secondary | ICD-10-CM | POA: Diagnosis not present

## 2019-09-10 DIAGNOSIS — I10 Essential (primary) hypertension: Secondary | ICD-10-CM | POA: Diagnosis not present

## 2019-09-10 DIAGNOSIS — Z885 Allergy status to narcotic agent status: Secondary | ICD-10-CM | POA: Diagnosis not present

## 2019-09-10 DIAGNOSIS — Z20822 Contact with and (suspected) exposure to covid-19: Secondary | ICD-10-CM | POA: Diagnosis not present

## 2019-09-10 DIAGNOSIS — Z8249 Family history of ischemic heart disease and other diseases of the circulatory system: Secondary | ICD-10-CM | POA: Diagnosis not present

## 2019-09-10 DIAGNOSIS — G8918 Other acute postprocedural pain: Secondary | ICD-10-CM | POA: Diagnosis not present

## 2019-09-10 DIAGNOSIS — S301XXA Contusion of abdominal wall, initial encounter: Secondary | ICD-10-CM | POA: Diagnosis not present

## 2019-09-10 DIAGNOSIS — Z8616 Personal history of COVID-19: Secondary | ICD-10-CM | POA: Diagnosis not present

## 2019-09-10 DIAGNOSIS — Z9181 History of falling: Secondary | ICD-10-CM | POA: Diagnosis not present

## 2019-09-10 DIAGNOSIS — Z79899 Other long term (current) drug therapy: Secondary | ICD-10-CM | POA: Diagnosis not present

## 2019-09-10 DIAGNOSIS — Z888 Allergy status to other drugs, medicaments and biological substances status: Secondary | ICD-10-CM | POA: Diagnosis not present

## 2019-09-10 DIAGNOSIS — K660 Peritoneal adhesions (postprocedural) (postinfection): Secondary | ICD-10-CM | POA: Diagnosis not present

## 2019-09-11 DIAGNOSIS — Z79899 Other long term (current) drug therapy: Secondary | ICD-10-CM | POA: Diagnosis not present

## 2019-09-11 DIAGNOSIS — Z20822 Contact with and (suspected) exposure to covid-19: Secondary | ICD-10-CM | POA: Diagnosis not present

## 2019-09-11 DIAGNOSIS — S301XXA Contusion of abdominal wall, initial encounter: Secondary | ICD-10-CM | POA: Diagnosis not present

## 2019-09-11 DIAGNOSIS — K660 Peritoneal adhesions (postprocedural) (postinfection): Secondary | ICD-10-CM | POA: Diagnosis not present

## 2019-09-11 DIAGNOSIS — Z833 Family history of diabetes mellitus: Secondary | ICD-10-CM | POA: Diagnosis not present

## 2019-09-11 DIAGNOSIS — K567 Ileus, unspecified: Secondary | ICD-10-CM | POA: Diagnosis not present

## 2019-09-11 DIAGNOSIS — Z888 Allergy status to other drugs, medicaments and biological substances status: Secondary | ICD-10-CM | POA: Diagnosis not present

## 2019-09-11 DIAGNOSIS — R410 Disorientation, unspecified: Secondary | ICD-10-CM | POA: Diagnosis not present

## 2019-09-11 DIAGNOSIS — Z9181 History of falling: Secondary | ICD-10-CM | POA: Diagnosis not present

## 2019-09-11 DIAGNOSIS — Z8249 Family history of ischemic heart disease and other diseases of the circulatory system: Secondary | ICD-10-CM | POA: Diagnosis not present

## 2019-09-11 DIAGNOSIS — K432 Incisional hernia without obstruction or gangrene: Secondary | ICD-10-CM | POA: Diagnosis not present

## 2019-09-11 DIAGNOSIS — Z8616 Personal history of COVID-19: Secondary | ICD-10-CM | POA: Diagnosis not present

## 2019-09-11 DIAGNOSIS — Z87891 Personal history of nicotine dependence: Secondary | ICD-10-CM | POA: Diagnosis not present

## 2019-09-11 DIAGNOSIS — E079 Disorder of thyroid, unspecified: Secondary | ICD-10-CM | POA: Diagnosis not present

## 2019-09-11 DIAGNOSIS — T8579XA Infection and inflammatory reaction due to other internal prosthetic devices, implants and grafts, initial encounter: Secondary | ICD-10-CM | POA: Diagnosis not present

## 2019-09-11 DIAGNOSIS — Z885 Allergy status to narcotic agent status: Secondary | ICD-10-CM | POA: Diagnosis not present

## 2019-09-11 DIAGNOSIS — I10 Essential (primary) hypertension: Secondary | ICD-10-CM | POA: Diagnosis not present

## 2019-09-12 DIAGNOSIS — Z8249 Family history of ischemic heart disease and other diseases of the circulatory system: Secondary | ICD-10-CM | POA: Diagnosis not present

## 2019-09-12 DIAGNOSIS — E079 Disorder of thyroid, unspecified: Secondary | ICD-10-CM | POA: Diagnosis not present

## 2019-09-12 DIAGNOSIS — Z9181 History of falling: Secondary | ICD-10-CM | POA: Diagnosis not present

## 2019-09-12 DIAGNOSIS — Z20822 Contact with and (suspected) exposure to covid-19: Secondary | ICD-10-CM | POA: Diagnosis not present

## 2019-09-12 DIAGNOSIS — I10 Essential (primary) hypertension: Secondary | ICD-10-CM | POA: Diagnosis not present

## 2019-09-12 DIAGNOSIS — Z8616 Personal history of COVID-19: Secondary | ICD-10-CM | POA: Diagnosis not present

## 2019-09-12 DIAGNOSIS — R410 Disorientation, unspecified: Secondary | ICD-10-CM | POA: Diagnosis not present

## 2019-09-12 DIAGNOSIS — T8579XA Infection and inflammatory reaction due to other internal prosthetic devices, implants and grafts, initial encounter: Secondary | ICD-10-CM | POA: Diagnosis not present

## 2019-09-12 DIAGNOSIS — Z833 Family history of diabetes mellitus: Secondary | ICD-10-CM | POA: Diagnosis not present

## 2019-09-12 DIAGNOSIS — Z885 Allergy status to narcotic agent status: Secondary | ICD-10-CM | POA: Diagnosis not present

## 2019-09-12 DIAGNOSIS — Z888 Allergy status to other drugs, medicaments and biological substances status: Secondary | ICD-10-CM | POA: Diagnosis not present

## 2019-09-12 DIAGNOSIS — Z87891 Personal history of nicotine dependence: Secondary | ICD-10-CM | POA: Diagnosis not present

## 2019-09-12 DIAGNOSIS — K660 Peritoneal adhesions (postprocedural) (postinfection): Secondary | ICD-10-CM | POA: Diagnosis not present

## 2019-09-12 DIAGNOSIS — Z79899 Other long term (current) drug therapy: Secondary | ICD-10-CM | POA: Diagnosis not present

## 2019-09-12 DIAGNOSIS — S301XXA Contusion of abdominal wall, initial encounter: Secondary | ICD-10-CM | POA: Diagnosis not present

## 2019-09-12 DIAGNOSIS — K567 Ileus, unspecified: Secondary | ICD-10-CM | POA: Diagnosis not present

## 2019-09-12 DIAGNOSIS — K432 Incisional hernia without obstruction or gangrene: Secondary | ICD-10-CM | POA: Diagnosis not present

## 2019-09-12 DIAGNOSIS — R112 Nausea with vomiting, unspecified: Secondary | ICD-10-CM | POA: Diagnosis not present

## 2019-09-13 DIAGNOSIS — I10 Essential (primary) hypertension: Secondary | ICD-10-CM | POA: Diagnosis not present

## 2019-09-13 DIAGNOSIS — Z8249 Family history of ischemic heart disease and other diseases of the circulatory system: Secondary | ICD-10-CM | POA: Diagnosis not present

## 2019-09-13 DIAGNOSIS — K432 Incisional hernia without obstruction or gangrene: Secondary | ICD-10-CM | POA: Diagnosis not present

## 2019-09-13 DIAGNOSIS — Z8616 Personal history of COVID-19: Secondary | ICD-10-CM | POA: Diagnosis not present

## 2019-09-13 DIAGNOSIS — Z20822 Contact with and (suspected) exposure to covid-19: Secondary | ICD-10-CM | POA: Diagnosis not present

## 2019-09-13 DIAGNOSIS — Z9181 History of falling: Secondary | ICD-10-CM | POA: Diagnosis not present

## 2019-09-13 DIAGNOSIS — R410 Disorientation, unspecified: Secondary | ICD-10-CM | POA: Diagnosis not present

## 2019-09-13 DIAGNOSIS — T8579XA Infection and inflammatory reaction due to other internal prosthetic devices, implants and grafts, initial encounter: Secondary | ICD-10-CM | POA: Diagnosis not present

## 2019-09-13 DIAGNOSIS — Z87891 Personal history of nicotine dependence: Secondary | ICD-10-CM | POA: Diagnosis not present

## 2019-09-13 DIAGNOSIS — K567 Ileus, unspecified: Secondary | ICD-10-CM | POA: Diagnosis not present

## 2019-09-13 DIAGNOSIS — Z885 Allergy status to narcotic agent status: Secondary | ICD-10-CM | POA: Diagnosis not present

## 2019-09-13 DIAGNOSIS — Z79899 Other long term (current) drug therapy: Secondary | ICD-10-CM | POA: Diagnosis not present

## 2019-09-13 DIAGNOSIS — Z888 Allergy status to other drugs, medicaments and biological substances status: Secondary | ICD-10-CM | POA: Diagnosis not present

## 2019-09-13 DIAGNOSIS — R112 Nausea with vomiting, unspecified: Secondary | ICD-10-CM | POA: Diagnosis not present

## 2019-09-13 DIAGNOSIS — S301XXA Contusion of abdominal wall, initial encounter: Secondary | ICD-10-CM | POA: Diagnosis not present

## 2019-09-13 DIAGNOSIS — S31109A Unspecified open wound of abdominal wall, unspecified quadrant without penetration into peritoneal cavity, initial encounter: Secondary | ICD-10-CM | POA: Diagnosis not present

## 2019-09-13 DIAGNOSIS — K660 Peritoneal adhesions (postprocedural) (postinfection): Secondary | ICD-10-CM | POA: Diagnosis not present

## 2019-09-13 DIAGNOSIS — E079 Disorder of thyroid, unspecified: Secondary | ICD-10-CM | POA: Diagnosis not present

## 2019-09-13 DIAGNOSIS — Z833 Family history of diabetes mellitus: Secondary | ICD-10-CM | POA: Diagnosis not present

## 2019-09-14 DIAGNOSIS — Z87891 Personal history of nicotine dependence: Secondary | ICD-10-CM | POA: Diagnosis not present

## 2019-09-14 DIAGNOSIS — Z9181 History of falling: Secondary | ICD-10-CM | POA: Diagnosis not present

## 2019-09-14 DIAGNOSIS — I10 Essential (primary) hypertension: Secondary | ICD-10-CM | POA: Diagnosis not present

## 2019-09-14 DIAGNOSIS — K567 Ileus, unspecified: Secondary | ICD-10-CM | POA: Diagnosis not present

## 2019-09-14 DIAGNOSIS — Z20822 Contact with and (suspected) exposure to covid-19: Secondary | ICD-10-CM | POA: Diagnosis not present

## 2019-09-14 DIAGNOSIS — K432 Incisional hernia without obstruction or gangrene: Secondary | ICD-10-CM | POA: Diagnosis not present

## 2019-09-14 DIAGNOSIS — R410 Disorientation, unspecified: Secondary | ICD-10-CM | POA: Diagnosis not present

## 2019-09-14 DIAGNOSIS — T8130XA Disruption of wound, unspecified, initial encounter: Secondary | ICD-10-CM | POA: Diagnosis not present

## 2019-09-14 DIAGNOSIS — R1110 Vomiting, unspecified: Secondary | ICD-10-CM | POA: Diagnosis not present

## 2019-09-14 DIAGNOSIS — K660 Peritoneal adhesions (postprocedural) (postinfection): Secondary | ICD-10-CM | POA: Diagnosis not present

## 2019-09-14 DIAGNOSIS — R112 Nausea with vomiting, unspecified: Secondary | ICD-10-CM | POA: Diagnosis not present

## 2019-09-14 DIAGNOSIS — S301XXA Contusion of abdominal wall, initial encounter: Secondary | ICD-10-CM | POA: Diagnosis not present

## 2019-09-14 DIAGNOSIS — T8189XA Other complications of procedures, not elsewhere classified, initial encounter: Secondary | ICD-10-CM | POA: Diagnosis not present

## 2019-09-14 DIAGNOSIS — R2689 Other abnormalities of gait and mobility: Secondary | ICD-10-CM | POA: Diagnosis not present

## 2019-09-14 DIAGNOSIS — Z8249 Family history of ischemic heart disease and other diseases of the circulatory system: Secondary | ICD-10-CM | POA: Diagnosis not present

## 2019-09-14 DIAGNOSIS — Z888 Allergy status to other drugs, medicaments and biological substances status: Secondary | ICD-10-CM | POA: Diagnosis not present

## 2019-09-14 DIAGNOSIS — Z79899 Other long term (current) drug therapy: Secondary | ICD-10-CM | POA: Diagnosis not present

## 2019-09-14 DIAGNOSIS — Z833 Family history of diabetes mellitus: Secondary | ICD-10-CM | POA: Diagnosis not present

## 2019-09-14 DIAGNOSIS — E079 Disorder of thyroid, unspecified: Secondary | ICD-10-CM | POA: Diagnosis not present

## 2019-09-14 DIAGNOSIS — Z8616 Personal history of COVID-19: Secondary | ICD-10-CM | POA: Diagnosis not present

## 2019-09-14 DIAGNOSIS — Z885 Allergy status to narcotic agent status: Secondary | ICD-10-CM | POA: Diagnosis not present

## 2019-09-14 DIAGNOSIS — T8579XA Infection and inflammatory reaction due to other internal prosthetic devices, implants and grafts, initial encounter: Secondary | ICD-10-CM | POA: Diagnosis not present

## 2019-09-16 DIAGNOSIS — R112 Nausea with vomiting, unspecified: Secondary | ICD-10-CM | POA: Diagnosis not present

## 2019-09-16 DIAGNOSIS — R1110 Vomiting, unspecified: Secondary | ICD-10-CM | POA: Diagnosis not present

## 2019-09-21 DIAGNOSIS — Z48815 Encounter for surgical aftercare following surgery on the digestive system: Secondary | ICD-10-CM | POA: Diagnosis not present

## 2019-09-21 DIAGNOSIS — M329 Systemic lupus erythematosus, unspecified: Secondary | ICD-10-CM | POA: Diagnosis not present

## 2019-09-21 DIAGNOSIS — Z9889 Other specified postprocedural states: Secondary | ICD-10-CM | POA: Diagnosis not present

## 2019-09-21 DIAGNOSIS — K5900 Constipation, unspecified: Secondary | ICD-10-CM | POA: Diagnosis not present

## 2019-09-21 DIAGNOSIS — L98492 Non-pressure chronic ulcer of skin of other sites with fat layer exposed: Secondary | ICD-10-CM | POA: Diagnosis not present

## 2019-09-22 DIAGNOSIS — M3210 Systemic lupus erythematosus, organ or system involvement unspecified: Secondary | ICD-10-CM | POA: Diagnosis not present

## 2019-09-22 DIAGNOSIS — L98492 Non-pressure chronic ulcer of skin of other sites with fat layer exposed: Secondary | ICD-10-CM | POA: Diagnosis not present

## 2019-09-24 DIAGNOSIS — M329 Systemic lupus erythematosus, unspecified: Secondary | ICD-10-CM | POA: Diagnosis not present

## 2019-09-24 DIAGNOSIS — L98492 Non-pressure chronic ulcer of skin of other sites with fat layer exposed: Secondary | ICD-10-CM | POA: Diagnosis not present

## 2019-09-27 DIAGNOSIS — L98492 Non-pressure chronic ulcer of skin of other sites with fat layer exposed: Secondary | ICD-10-CM | POA: Diagnosis not present

## 2019-09-27 DIAGNOSIS — Z9889 Other specified postprocedural states: Secondary | ICD-10-CM | POA: Diagnosis not present

## 2019-09-27 DIAGNOSIS — M329 Systemic lupus erythematosus, unspecified: Secondary | ICD-10-CM | POA: Diagnosis not present

## 2019-09-27 DIAGNOSIS — M3210 Systemic lupus erythematosus, organ or system involvement unspecified: Secondary | ICD-10-CM | POA: Diagnosis not present

## 2019-09-30 DIAGNOSIS — T8130XA Disruption of wound, unspecified, initial encounter: Secondary | ICD-10-CM | POA: Diagnosis not present

## 2019-09-30 DIAGNOSIS — M3210 Systemic lupus erythematosus, organ or system involvement unspecified: Secondary | ICD-10-CM | POA: Diagnosis not present

## 2019-09-30 DIAGNOSIS — K6389 Other specified diseases of intestine: Secondary | ICD-10-CM | POA: Diagnosis not present

## 2019-09-30 DIAGNOSIS — Z720 Tobacco use: Secondary | ICD-10-CM | POA: Diagnosis not present

## 2019-09-30 DIAGNOSIS — I10 Essential (primary) hypertension: Secondary | ICD-10-CM | POA: Diagnosis not present

## 2019-09-30 DIAGNOSIS — Z833 Family history of diabetes mellitus: Secondary | ICD-10-CM | POA: Diagnosis not present

## 2019-09-30 DIAGNOSIS — E559 Vitamin D deficiency, unspecified: Secondary | ICD-10-CM | POA: Diagnosis not present

## 2019-09-30 DIAGNOSIS — K632 Fistula of intestine: Secondary | ICD-10-CM | POA: Diagnosis not present

## 2019-09-30 DIAGNOSIS — Z8711 Personal history of peptic ulcer disease: Secondary | ICD-10-CM | POA: Diagnosis not present

## 2019-09-30 DIAGNOSIS — Z8673 Personal history of transient ischemic attack (TIA), and cerebral infarction without residual deficits: Secondary | ICD-10-CM | POA: Diagnosis not present

## 2019-09-30 DIAGNOSIS — K91870 Postprocedural hematoma of a digestive system organ or structure following a digestive system procedure: Secondary | ICD-10-CM | POA: Diagnosis not present

## 2019-09-30 DIAGNOSIS — R112 Nausea with vomiting, unspecified: Secondary | ICD-10-CM | POA: Diagnosis not present

## 2019-09-30 DIAGNOSIS — R109 Unspecified abdominal pain: Secondary | ICD-10-CM | POA: Diagnosis not present

## 2019-09-30 DIAGNOSIS — T8131XA Disruption of external operation (surgical) wound, not elsewhere classified, initial encounter: Secondary | ICD-10-CM | POA: Diagnosis not present

## 2019-09-30 DIAGNOSIS — E079 Disorder of thyroid, unspecified: Secondary | ICD-10-CM | POA: Diagnosis not present

## 2019-09-30 DIAGNOSIS — Z8249 Family history of ischemic heart disease and other diseases of the circulatory system: Secondary | ICD-10-CM | POA: Diagnosis not present

## 2019-09-30 DIAGNOSIS — Z8616 Personal history of COVID-19: Secondary | ICD-10-CM | POA: Diagnosis not present

## 2019-09-30 DIAGNOSIS — I700 Atherosclerosis of aorta: Secondary | ICD-10-CM | POA: Diagnosis not present

## 2019-09-30 DIAGNOSIS — K5900 Constipation, unspecified: Secondary | ICD-10-CM | POA: Diagnosis not present

## 2019-09-30 DIAGNOSIS — E039 Hypothyroidism, unspecified: Secondary | ICD-10-CM | POA: Diagnosis not present

## 2019-09-30 DIAGNOSIS — L98492 Non-pressure chronic ulcer of skin of other sites with fat layer exposed: Secondary | ICD-10-CM | POA: Diagnosis not present

## 2019-10-01 DIAGNOSIS — I10 Essential (primary) hypertension: Secondary | ICD-10-CM | POA: Diagnosis not present

## 2019-10-01 DIAGNOSIS — K5900 Constipation, unspecified: Secondary | ICD-10-CM | POA: Diagnosis not present

## 2019-10-01 DIAGNOSIS — R112 Nausea with vomiting, unspecified: Secondary | ICD-10-CM | POA: Diagnosis not present

## 2019-10-01 DIAGNOSIS — T8131XA Disruption of external operation (surgical) wound, not elsewhere classified, initial encounter: Secondary | ICD-10-CM | POA: Diagnosis not present

## 2019-10-07 ENCOUNTER — Encounter (HOSPITAL_BASED_OUTPATIENT_CLINIC_OR_DEPARTMENT_OTHER): Payer: Self-pay | Admitting: Emergency Medicine

## 2019-10-07 ENCOUNTER — Other Ambulatory Visit: Payer: Self-pay

## 2019-10-07 ENCOUNTER — Emergency Department (HOSPITAL_BASED_OUTPATIENT_CLINIC_OR_DEPARTMENT_OTHER)
Admission: EM | Admit: 2019-10-07 | Discharge: 2019-10-07 | Disposition: A | Discharge status: Home / Self Care | Payer: Medicare Other | Source: Home / Self Care | Attending: Emergency Medicine | Admitting: Emergency Medicine

## 2019-10-07 ENCOUNTER — Encounter (HOSPITAL_COMMUNITY): Payer: Self-pay | Admitting: Emergency Medicine

## 2019-10-07 ENCOUNTER — Emergency Department (HOSPITAL_COMMUNITY)
Admission: EM | Admit: 2019-10-07 | Discharge: 2019-10-07 | Disposition: A | Discharge status: Home / Self Care | Payer: Medicare Other | Attending: Emergency Medicine | Admitting: Emergency Medicine

## 2019-10-07 DIAGNOSIS — K219 Gastro-esophageal reflux disease without esophagitis: Secondary | ICD-10-CM | POA: Insufficient documentation

## 2019-10-07 DIAGNOSIS — R1084 Generalized abdominal pain: Secondary | ICD-10-CM

## 2019-10-07 DIAGNOSIS — Z79899 Other long term (current) drug therapy: Secondary | ICD-10-CM | POA: Insufficient documentation

## 2019-10-07 DIAGNOSIS — R112 Nausea with vomiting, unspecified: Secondary | ICD-10-CM | POA: Insufficient documentation

## 2019-10-07 DIAGNOSIS — Z7982 Long term (current) use of aspirin: Secondary | ICD-10-CM | POA: Insufficient documentation

## 2019-10-07 DIAGNOSIS — Z5321 Procedure and treatment not carried out due to patient leaving prior to being seen by health care provider: Secondary | ICD-10-CM | POA: Insufficient documentation

## 2019-10-07 DIAGNOSIS — F17210 Nicotine dependence, cigarettes, uncomplicated: Secondary | ICD-10-CM | POA: Insufficient documentation

## 2019-10-07 DIAGNOSIS — E039 Hypothyroidism, unspecified: Secondary | ICD-10-CM | POA: Insufficient documentation

## 2019-10-07 DIAGNOSIS — I10 Essential (primary) hypertension: Secondary | ICD-10-CM | POA: Insufficient documentation

## 2019-10-07 DIAGNOSIS — I2510 Atherosclerotic heart disease of native coronary artery without angina pectoris: Secondary | ICD-10-CM | POA: Insufficient documentation

## 2019-10-07 LAB — CBC WITH DIFFERENTIAL/PLATELET
Abs Immature Granulocytes: 0.01 10*3/uL (ref 0.00–0.07)
Basophils Absolute: 0 10*3/uL (ref 0.0–0.1)
Basophils Relative: 1 %
Eosinophils Absolute: 0.6 10*3/uL — ABNORMAL HIGH (ref 0.0–0.5)
Eosinophils Relative: 9 %
HCT: 32.2 % — ABNORMAL LOW (ref 36.0–46.0)
Hemoglobin: 10.7 g/dL — ABNORMAL LOW (ref 12.0–15.0)
Immature Granulocytes: 0 %
Lymphocytes Relative: 41 %
Lymphs Abs: 3 10*3/uL (ref 0.7–4.0)
MCH: 25.7 pg — ABNORMAL LOW (ref 26.0–34.0)
MCHC: 33.2 g/dL (ref 30.0–36.0)
MCV: 77.2 fL — ABNORMAL LOW (ref 80.0–100.0)
Monocytes Absolute: 0.7 10*3/uL (ref 0.1–1.0)
Monocytes Relative: 10 %
Neutro Abs: 2.8 10*3/uL (ref 1.7–7.7)
Neutrophils Relative %: 39 %
Platelets: 511 10*3/uL — ABNORMAL HIGH (ref 150–400)
RBC: 4.17 MIL/uL (ref 3.87–5.11)
RDW: 17.4 % — ABNORMAL HIGH (ref 11.5–15.5)
WBC: 7.2 10*3/uL (ref 4.0–10.5)
nRBC: 0 % (ref 0.0–0.2)

## 2019-10-07 LAB — COMPREHENSIVE METABOLIC PANEL
ALT: 17 U/L (ref 0–44)
AST: 18 U/L (ref 15–41)
Albumin: 3.2 g/dL — ABNORMAL LOW (ref 3.5–5.0)
Alkaline Phosphatase: 95 U/L (ref 38–126)
Anion gap: 10 (ref 5–15)
BUN: 6 mg/dL (ref 6–20)
CO2: 24 mmol/L (ref 22–32)
Calcium: 9.2 mg/dL (ref 8.9–10.3)
Chloride: 105 mmol/L (ref 98–111)
Creatinine, Ser: 0.57 mg/dL (ref 0.44–1.00)
GFR calc Af Amer: >60 mL/min (ref >60)
GFR calc non Af Amer: >60 mL/min (ref >60)
Glucose, Bld: 105 mg/dL — ABNORMAL HIGH (ref 70–99)
Potassium: 3.8 mmol/L (ref 3.5–5.1)
Sodium: 139 mmol/L (ref 135–145)
Total Bilirubin: 0.4 mg/dL (ref 0.3–1.2)
Total Protein: 8 g/dL (ref 6.5–8.1)

## 2019-10-07 LAB — LIPASE, BLOOD: Lipase: 19 U/L (ref 11–51)

## 2019-10-07 MED ORDER — SODIUM CHLORIDE 0.9% FLUSH: 3.0000 mL | Freq: Once | INTRAVENOUS | Status: DC

## 2019-10-07 MED ORDER — HYDROMORPHONE HCL 1 MG/ML IJ SOLN
1.0000 mg | Freq: Once | INTRAMUSCULAR | Status: AC
  Administered 2019-10-07: 1 mg via INTRAMUSCULAR
  Filled 2019-10-07: qty 1

## 2019-10-07 MED ORDER — PROMETHAZINE HCL 25 MG/ML IJ SOLN
25.0000 mg | Freq: Once | INTRAMUSCULAR | Status: AC
  Administered 2019-10-07: 25 mg via INTRAMUSCULAR
  Filled 2019-10-07: qty 1

## 2019-10-07 NOTE — ED Triage Notes (Addendum)
Patient complaining of nausea and vomiting for two days. She also states that her wound on her abdomen has gotten bigger and is painful. Patient is also complaining of not being able to urinate for one day and half per patient.

## 2019-10-07 NOTE — ED Notes (Signed)
Called for recheck of V/S x1 No answer

## 2019-10-07 NOTE — ED Notes (Signed)
Pt is difficult stick Attempted blood draw x1, unsuccessful Pt states she needs US/IV

## 2019-10-07 NOTE — ED Triage Notes (Signed)
Pt had abd surgery on the 25th to remove infected mesh and replace it to cover a hernia; pt says the wound has opened more and she reports severe pain and N/V

## 2019-10-07 NOTE — ED Notes (Signed)
Attempted straight stick once, was unsuccessful; pt tolerated well

## 2019-10-07 NOTE — ED Provider Notes (Signed)
[facility] EMERGENCY DEPARTMENT Provider Note   CSN: [id number] Arrival date & time: 10/07/19  0330     History Chief Complaint  Patient presents with  . Abdominal Pain  . Post-op Problem    Angel Price is a 54 y.o. female.  Long history of abdominal surgeries and hernia repairs fistulas and abscesses recently discharged from [facility] 1 day ago after a four 5-day hospital admission for emesis and pain.  Patient presents here initially stating that she had worsening emesis and worsening opening of her wound and worsening pain.  However when I discussed her recent hospitalization she agreed that this was not any worse than that actually.  She stated that she has Phenergan at home and it seems to be helping.  Versus no other new symptoms.   Abdominal Pain Pain location:  Generalized      Past Medical History:  Diagnosis Date  . Abscess    soft tissue  . Adrenal mass ([place])   . Alcohol abuse   . Anxiety   . Blood transfusion without reported diagnosis   . Chronic abdominal pain   . Chronic wound infection of abdomen   . Colon polyp    colonoscopy 04/2014  . Depression   . Diverticulosis    colonoscopy 04/2014 moderat pan colonic  . Drug-seeking behavior   . Gastritis    EGD 05/2014  . Gastroparesis Nov 2015  . GERD (gastroesophageal reflux disease)   . Hemorrhoid    internal large  . Hiatal hernia   . History of Billroth II operation   . Hypertension   . Lung nodule    CT 02/2014 needs repeat 1 month  . Lung nodule < 6cm on CT 04/25/2014  . Lupus ([place])   . Malingering   . Nausea and vomiting    chronic, recurrent  . Pancreatitis   . Schatzki's ring    patent per EGD 04/2014  . Sickle cell trait ([place])   . Suicide attempt ([place])   . 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 Coronary artery disease involving native coronary artery of native heart without angina pectoris  06/25/2019  . Shortness of breath 06/15/2019  . Chest pain 06/15/2019  . Acute respiratory failure with hypoxia ([place]) 05/20/2019  . Essential hypertension 05/20/2019  . COVID-19 05/20/2019  . Syncope and collapse 05/20/2019  . Right sided weakness 05/16/2019  . Metabolic acidosis 35/57/3220  . Intractable vomiting with nausea 02/23/2019  . Abnormal CT scan, colon 12/09/2018  . Abnormal computed tomography of cecum and terminal ileum 12/09/2018  . Cocaine abuse ([place])   . Colitis 11/11/2018  . Hemorrhoidal skin tag   . SBO (small bowel obstruction) ([place])   . Non-intractable vomiting with nausea   . Folate deficiency 05/11/2017  . Abdominal wall fistula   . Cellulitis of abdominal wall 01/02/2017  . Acute left hemiparesis ([place]) 12/05/2016  . Malingering 12/05/2016  . Weakness of left lower extremity 12/02/2016  . CVA (cerebral vascular accident) ([place]) 11/23/2016  . Bright red rectal bleeding 08/22/2016  . Hypotension due to blood loss   . Acute GI bleeding 05/24/2016  . Dieulafoy lesion of duodenum   . History of Billroth II operation   . Gastrointestinal hemorrhage 05/22/2016  . Absolute anemia   . Acute pancreatitis 10/01/2015  . Abnormal CT scan of lung 10/01/2015  . Alcohol abuse with intoxication ([place])   .  Left-sided weakness   . Psychosomatic factor in physical condition   . Upper GI bleed   . Diverticulosis of colon with hemorrhage   . Chronic wound infection of abdomen 12/22/2014  . 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  . HTN (hypertension) 12/22/2014  . Ataxia 11/01/2014  . Hemorrhoids 04/26/2014  . Lung nodule 04/26/2014  . Diverticulosis   . Gastritis   . Hiatal hernia   . Schatzki's ring   . Acute blood loss anemia 04/25/2014  . Sinus tachycardia 04/25/2014  . Hypokalemia 04/25/2014  . Hematemesis with nausea 04/25/2014  . Lung nodule < 6cm on CT 04/25/2014  . Intractable nausea and vomiting 04/24/2014  . Gastroparesis   . Abdominal pain 04/01/2014  . Chronic  abdominal pain 01/21/2014  . Gastroenteritis 12/10/2013  . Chronic abdominal wound infection 08/16/2013  . MDD (major depressive disorder), recurrent episode, severe ([place]) 06/27/2013  . Wrist laceration 06/24/2013  . Diarrhea 05/07/2013  . Rectal bleeding 05/07/2013  . Abnormal LFTs 05/07/2013  . Adrenal mass, left ([place]) 05/07/2013  . Abdominal wall abscess at site of surgical wound 04/19/2013  . Abdominal wall abscess 04/18/2013  . Frequent headaches 03/30/2013  . Sleep difficulties 03/30/2013  . Essential hypertension, benign 03/30/2013  . History of cocaine abuse ([place]) 03/16/2013  . History of schizoaffective disorder 03/16/2013  . Bipolar disorder ([place]) 03/16/2013  . Personality disorder ([place]) 03/16/2013  . Tobacco abuse 03/16/2013  . Alcohol abuse 03/16/2013  . Palpitations 03/16/2013  . Poor vision 03/16/2013  . History of gastric bypass 03/16/2013  . Status post hysterectomy with oophorectomy 03/16/2013  . Hypothyroid 03/16/2013  . Lupus ([place]) 03/16/2013    Past Surgical History:  Procedure Laterality Date  . ABDOMINAL HYSTERECTOMY  2013   [place]  . ABDOMINAL HYSTERECTOMY    . ABDOMINAL SURGERY    . ADRENALECTOMY Right   . AGILE CAPSULE N/A 01/05/2015   Procedure: AGILE CAPSULE;  Surgeon: Daneil Dolin, MD;  Location[facility];  Service: Endoscopy;  Laterality: N/A;  0700  . Billroth II procedure      [place], first 2000, 2005/2006.  . bilroth 2    . BIOPSY  05/20/2013   Procedure: BIOPSIES OF ASCENDING AND SIGMOID COLON;  Surgeon: Daneil Dolin, MD;  Location: [facility] OR[facility];  Service: Endoscopy;;  . BIOPSY  04/26/2014   Procedure: BIOPSIES;  Surgeon: Danie Binder, MD;  Location: [facility];  Service: Endoscopy;;  . BIOPSY  12/24/2018   Procedure: BIOPSY;  Surgeon: Daneil Dolin, MD;  Location: [facility];  Service: Endoscopy;;  colon  . CHOLECYSTECTOMY    . COLONOSCOPY     in [place]  . COLONOSCOPY WITH PROPOFOL N/A 05/20/2013   Dr.Rourk- inadequate prep,  normal appearing rectum, grossly normal colon aside from pancolonic diverticula, normal terminal ileum bx= unremarkable colonic mucosa. Due for early interval 2016.   Marland Kitchen COLONOSCOPY WITH PROPOFOL N/A 04/26/2014   BSJ:[id number] ileum/one colon polyp removed/moderate pan-colonic diverticulosis/large internal hemorrhoids  . COLONOSCOPY WITH PROPOFOL N/A 12/23/2014   Dr.Rourk- minimal internal hemorrhoids, pancolonic diverticulosis  . COLONOSCOPY WITH PROPOFOL N/A 08/23/2016   Procedure: COLONOSCOPY WITH PROPOFOL;  Surgeon: Danie Binder, MD;  [facility];  Service: Endoscopy;  Laterality: N/A;  . COLONOSCOPY WITH PROPOFOL N/A 12/24/2018   Pancolonic diverticulosis, normal TI, one 5 mm polyp in rectum (tubular adenoma). Somewhat friable hemorrhagic mucosa in area of IC valve/cecum, possibly related to scope trauma s/p biopsy.   . DEBRIDEMENT OF ABDOMINAL WALL ABSCESS N/A 02/08/2013  Procedure: DEBRIDEMENT OF ABDOMINAL WALL ABSCESS;  Surgeon: Jamesetta So, MD;  Location: [facility];  Service: General;  Laterality: N/A;  . ESOPHAGOGASTRODUODENOSCOPY (EGD) WITH PROPOFOL N/A 05/20/2013   Dr.Rourk- s/p prior gastric surgery with normal esophagus, residual gastric mucosa and patent efferent limb  . ESOPHAGOGASTRODUODENOSCOPY (EGD) WITH PROPOFOL N/A 02/03/2014   Dr. Gala Romney:  s/p hemigastrectomy with retained gastric contents. Residual gastric mucosa and efferent limb appeared normal otherwise. Query gastroparesis.   Marland Kitchen ESOPHAGOGASTRODUODENOSCOPY (EGD) WITH PROPOFOL N/A 04/26/2014   ASN:[id number] ring/small HH/mild non-erosive gasrtitis/normal anastomosis  . ESOPHAGOGASTRODUODENOSCOPY (EGD) WITH PROPOFOL N/A 12/23/2014   Dr.Rourk- s/p prior hemigastrctomy, active oozing from anastomotic suture site, hemostasis achieved  . ESOPHAGOGASTRODUODENOSCOPY (EGD) WITH PROPOFOL N/A 05/23/2016   Dr. Oneida Alar while inpatient: red blood at anastomosis, s/p epi injection and clips, likely secondary to Dieulafoy's lesion at  anastomosis   . ESOPHAGOGASTRODUODENOSCOPY (EGD) WITH PROPOFOL N/A 05/11/2017   Procedure: ESOPHAGOGASTRODUODENOSCOPY (EGD) WITH PROPOFOL;  Surgeon: Daneil Dolin, MD;  [facility];  Service: Endoscopy;  Laterality: N/A;  . HEMORRHOID SURGERY N/A 06/18/2017   Procedure: THREE COLUMN EXTENSIVE HEMORRHOIDECTOMY;  Surgeon: Virl Cagey, MD;  Location: [facility];  Service: General;  Laterality: N/A;  . INCISION AND DRAINAGE ABSCESS N/A 01/06/2017   Procedure: INCISION AND DRAINAGE ABDOMINAL WALL ABSCESS;  Surgeon: Aviva Signs, MD;  Location: [facility];  Service: General;  Laterality: N/A;  . POLYPECTOMY  12/24/2018   Procedure: POLYPECTOMY;  Surgeon: Daneil Dolin, MD;  [facility];  Service: Endoscopy;;  colon  . tendon repar Right    wrist  . WOUND EXPLORATION Right 06/24/2013   Procedure: exploration of traumatic wound right wrist;  Surgeon: Tennis Must, MD;  Location: [facility];  Service: Orthopedics;  Laterality: Right;     OB History    Gravida  4   Para  4   Term  4   Preterm  0   AB  0   Living        SAB  0   TAB  0   Ectopic  0   Multiple      Live Births              Family History  Problem Relation Age of Onset  . Lung cancer Father   . Cancer Father        mets  . Brain cancer Son   . Schizophrenia Son   . Cancer Son        brain  . Drug abuse Mother   . Breast cancer Maternal Aunt   . Bipolar disorder Maternal Aunt   . Drug abuse Maternal Aunt   . Colon cancer Maternal Grandmother        late 36s, early 74s  . Drug abuse Sister   . Drug abuse Brother   . Bipolar disorder Paternal Grandfather   . Bipolar disorder Cousin   . Liver disease Neg Hx     Social History   Tobacco Use  . Smoking status: Current Every Day Smoker    Packs/day: 0.50    Years: 35.00    Pack years: 17.50    Types: Cigarettes  . Smokeless tobacco: Never Used  Vaping Use  . Vaping Use: Never used  Substance Use Topics  . Alcohol use: Not  Currently  . Drug use: Not Currently    Types: Cocaine    Home Medications Prior to Admission medications   Medication Sig Start Date End  Date Taking? Authorizing Provider  amLODipine (NORVASC) 10 MG tablet Take 1 tablet (10 mg total) by mouth daily. 06/16/19  Yes Pokhrel, Laxman, MD  aspirin EC 81 MG EC tablet Take 1 tablet (81 mg total) by mouth daily. 05/19/19  Yes Elgergawy, Silver Huguenin, MD  atenolol (TENORMIN) 50 MG tablet Take 50 mg by mouth daily. 02/01/19  Yes [provider]  buPROPion (WELLBUTRIN SR) 150 MG 12 hr tablet Take by mouth. 07/27/19  Yes [provider]  dicyclomine (BENTYL) 10 MG capsule Take 1 capsule (10 mg total) by mouth 4 (four) times daily -  before meals and at bedtime. For diarrhea and abdominal cramps 12/09/18  Yes Mahala Menghini, PA-C  escitalopram (LEXAPRO) 20 MG tablet Take 20 mg daily by mouth.   Yes [provider]  ferrous gluconate (FERGON) 324 MG tablet Take by mouth. 10/04/19 12/03/19 Yes [provider]  folic acid (FOLVITE) 1 MG tablet Take 1 tablet (1 mg total) by mouth daily. 11/13/18  Yes Emokpae, Courage, MD  pantoprazole (PROTONIX) 40 MG tablet Take 40 mg by mouth daily. 04/29/19  Yes [provider]  promethazine (PHENERGAN) 25 MG suppository Place 1 suppository (25 mg total) rectally every 6 (six) hours as needed for nausea or vomiting. 01/27/19  Yes McDonald, Mia A, PA-C  rosuvastatin (CRESTOR) 20 MG tablet Take 1 tablet (20 mg total) by mouth daily. 06/25/19 10/07/19 Yes Patwardhan, Manish J, MD  sucralfate (CARAFATE) 1 g tablet Take 1 g by mouth 4 (four) times daily -  with meals and at bedtime.   Yes [provider]  thiamine 100 MG tablet Take 1 tablet (100 mg total) by mouth daily. 11/13/18  Yes Emokpae, Courage, MD  vitamin B-12 (CYANOCOBALAMIN) 1000 MCG tablet Take 2,000 mcg by mouth daily.    Yes [provider]  acetaminophen (TYLENOL) 500 MG tablet Take 2 tablets (1,000 mg total) by mouth  3 (three) times daily as needed for mild pain or moderate pain. 02/26/19   Enzo Bi, MD  diphenhydrAMINE (BENADRYL) 25 MG tablet Take 25 mg by mouth every 6 (six) hours as needed for sleep.    [provider]  doxycycline (VIBRAMYCIN) 100 MG capsule Take 1 capsule (100 mg total) by mouth 2 (two) times daily. 06/17/19   Charlann Lange, PA-C  ergocalciferol (VITAMIN D2) 1.25 MG (50000 UT) capsule Take by mouth. 10/10/19 11/22/19  [provider]  nicotine (NICODERM CQ - DOSED IN MG/24 HOURS) 14 mg/24hr patch Place 1 patch (14 mg total) onto the skin daily. 05/19/19   Elgergawy, Silver Huguenin, MD  ondansetron (ZOFRAN) 4 MG tablet Take 1 tablet (4 mg total) by mouth every 6 (six) hours as needed for nausea. 11/14/91   Delora Fuel, MD  promethazine (PHENERGAN) 25 MG tablet Take 25 mg by mouth every 6 (six) hours as needed for nausea or vomiting.    [provider]    Allergies    Metoclopramide, Codeine, Morphine and related, and Reglan [metoclopramide]  Review of Systems   Review of Systems  Gastrointestinal: Positive for abdominal pain.  All other systems reviewed and are negative.   Physical Exam Updated Vital Signs BP 96/66 (BP Location: Right Arm)   Pulse 76   Temp 98.1 F (36.7 C) (Oral)   Resp 17   Ht 5\' 3"  (1.6 m)   Wt 65.8 kg   LMP  (LMP Unknown)   SpO2 97%   BMI 25.69 kg/m   Physical Exam Vitals and nursing  note reviewed.  Constitutional:      Appearance: She is well-developed.  HENT:     Head: Normocephalic and atraumatic.  Cardiovascular:     Rate and Rhythm: Normal rate and regular rhythm.  Pulmonary:     Effort: No respiratory distress.     Breath sounds: No stridor.  Abdominal:     General: There is no distension.     Palpations: Abdomen is soft.     Comments: Most of abdomen consists of scars and surgical wounds with some dehiscence but wound beds look pink, red without purulent drainage.   Genitourinary:    Cervix: No cervical motion  tenderness or discharge.     Uterus: Normal.      Adnexa:        Right: No mass.         Left: No mass.    Musculoskeletal:     Cervical back: Normal range of motion.  Skin:    General: Skin is warm and dry.  Neurological:     General: No focal deficit present.     Mental Status: She is alert.     ED Results / Procedures / Treatments   Labs (all labs ordered are listed, but only abnormal results are displayed) Labs Reviewed  CBC WITH DIFFERENTIAL/PLATELET - Abnormal; Notable for the following components:      Result Value   Hemoglobin 10.7 (*)    HCT 32.2 (*)    MCV 77.2 (*)    MCH 25.7 (*)    RDW 17.4 (*)    Platelets 511 (*)    Eosinophils Absolute 0.6 (*)    All other components within normal limits  COMPREHENSIVE METABOLIC PANEL - Abnormal; Notable for the following components:   Glucose, Bld 105 (*)    Albumin 3.2 (*)    All other components within normal limits  LIPASE, BLOOD    EKG None  Radiology No results found.  Procedures Procedures (including critical care time)  Medications Ordered in ED Medications  HYDROmorphone (DILAUDID) injection 1 mg (1 mg Intramuscular Given 10/07/19 0408)  promethazine (PHENERGAN) injection 25 mg (25 mg Intramuscular Given 10/07/19 0408)    ED Course  I have reviewed the triage vital signs and the nursing notes.  Pertinent labs & imaging results that were available during my care of the patient were reviewed by me and considered in my medical decision making (see chart for details).    MDM Rules/Calculators/A&P                          Give the patient benefit the doubt gave 1 dose of nausea medicine pain medicine pending work-up of labs.  The labs were unremarkable.  Patient was discharged to follow-up with her surgeon as scheduled.  Final Clinical Impression(s) / ED Diagnoses Final diagnoses:  Generalized abdominal pain    Rx / DC Orders ED Discharge Orders    None       Flynt Breeze, Corene Cornea, MD 10/07/19  0600

## 2019-10-08 IMAGING — CT CT ABDOMEN AND PELVIS WITH CONTRAST
2 of 5 series · 16 of 46 positions shown, 18 images · IV contrast (omnipaque)
Comparison: CT, 11/10/2017

CLINICAL DATA: Patient complaining of abdominal pain x 1 week with
vomiting and diarrhea x 3 days.

EXAM:
CT ABDOMEN AND PELVIS WITH CONTRAST
TECHNIQUE: Multidetector CT imaging of the abdomen and pelvis was performed
using the standard protocol following bolus administration of
intravenous contrast.
CONTRAST:  100mL OMNIPAQUE IOHEXOL 300 MG/ML  SOLN

[Series 2: axial st · axial · 0.75mm/px · z∈[+978,+1378]mm · 13 of 90 slices shown, 15 images]
[im 5/90  soft-tissue]
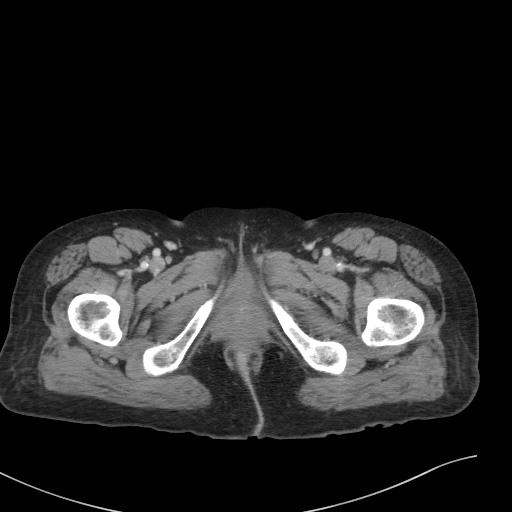
[im 5/90  bone]
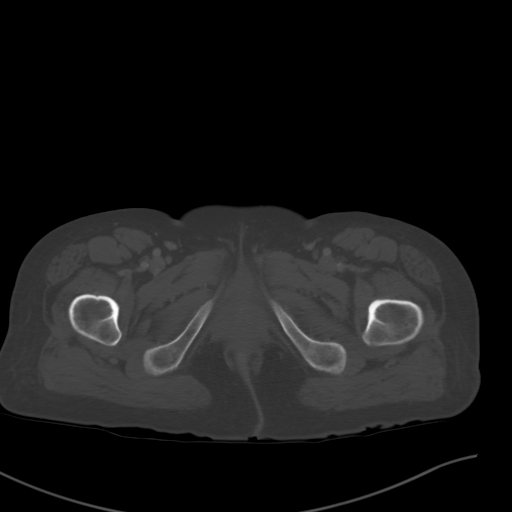
[im 15/90  soft-tissue]
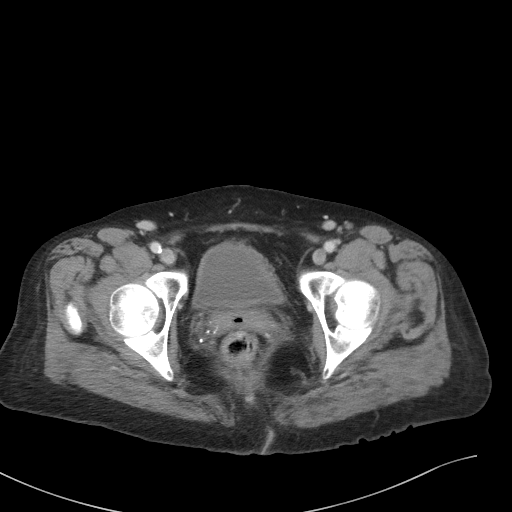
[im 19/90  soft-tissue]
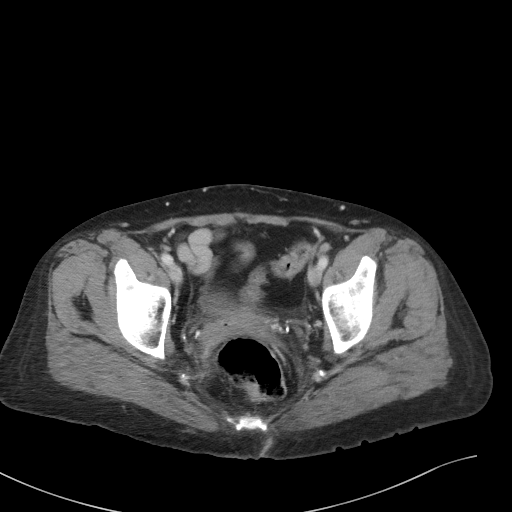
[im 24/90  soft-tissue]
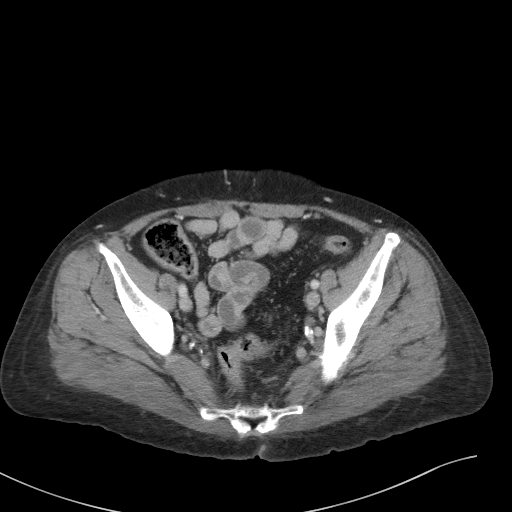
[im 33/90  soft-tissue]
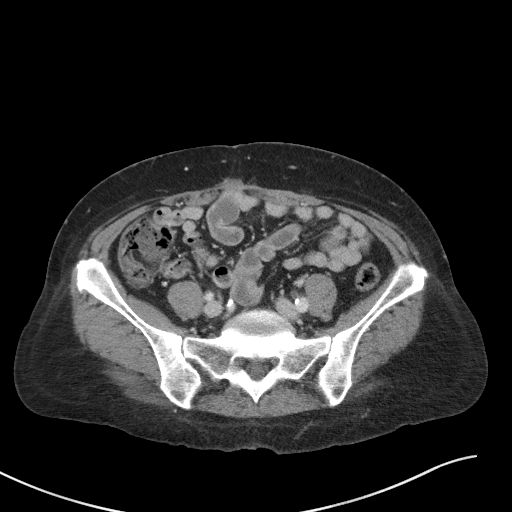
[im 38/90  soft-tissue]
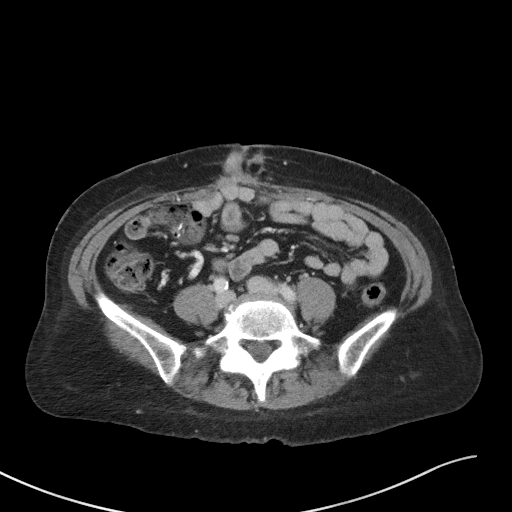
[im 47/90  soft-tissue]
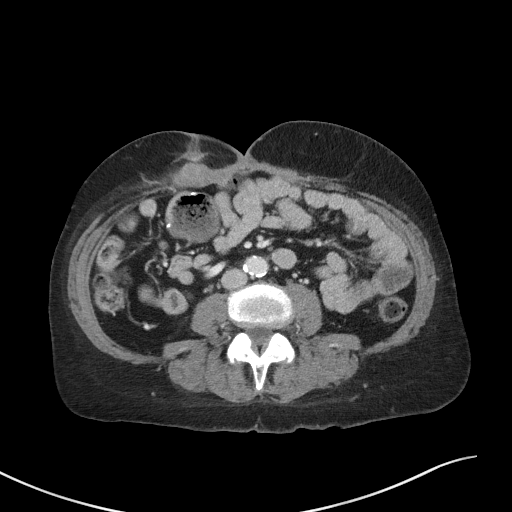
[im 52/90  soft-tissue]
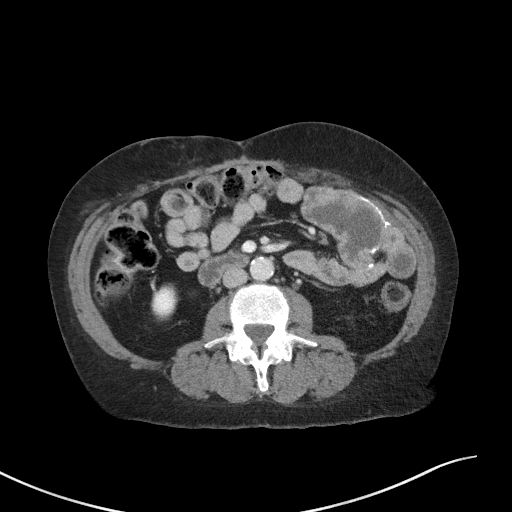
[im 57/90  soft-tissue]
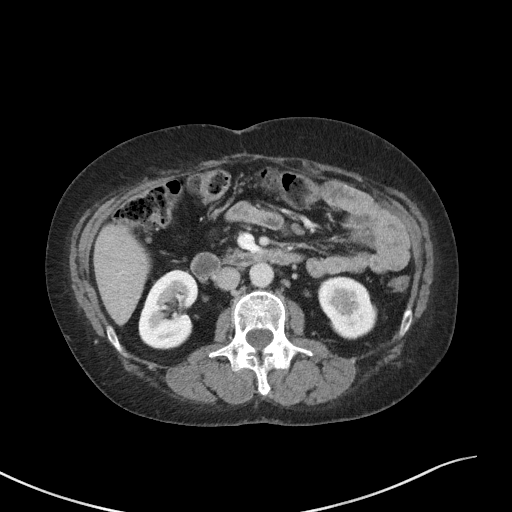
[im 57/90  bone]
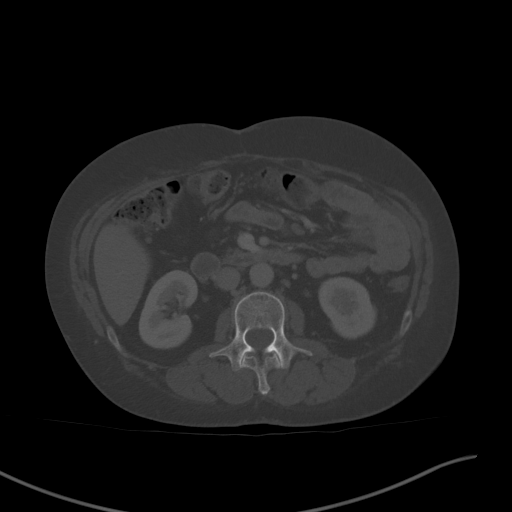
[im 66/90  soft-tissue]
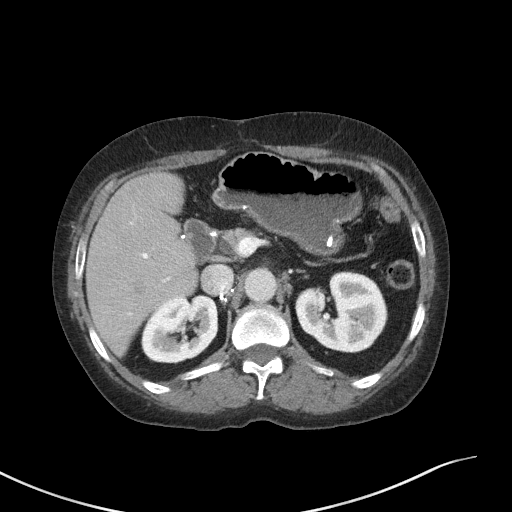
[im 71/90  soft-tissue]
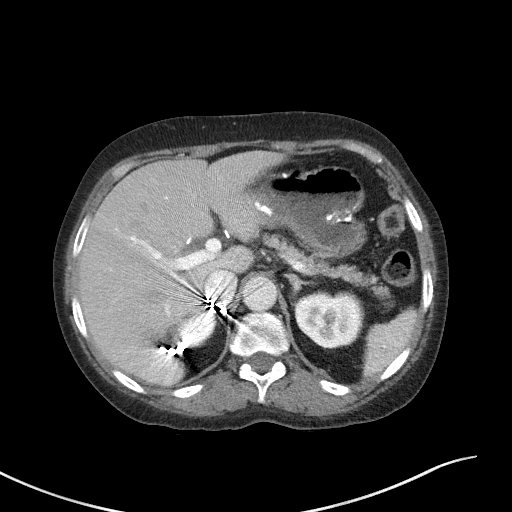
[im 75/90  soft-tissue]
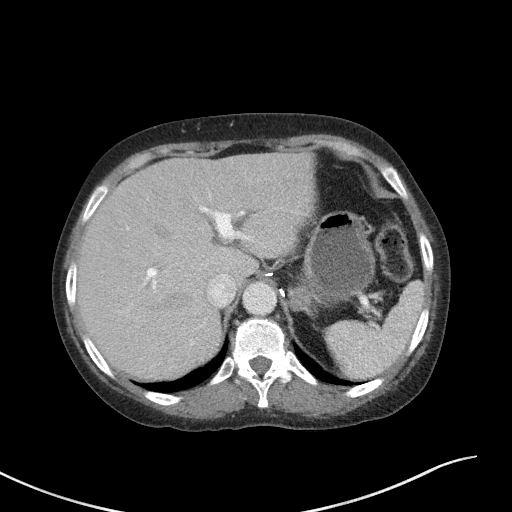
[im 85/90  soft-tissue]
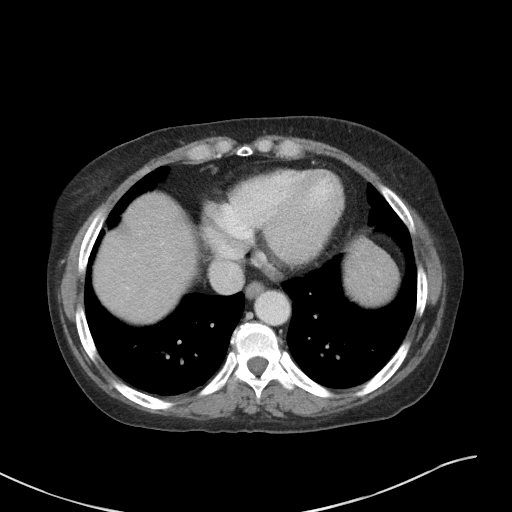

[Series 5: coronal st · coronal · 0.78mm/px · 3 of 84 slices shown]
[im 28/84  soft-tissue]
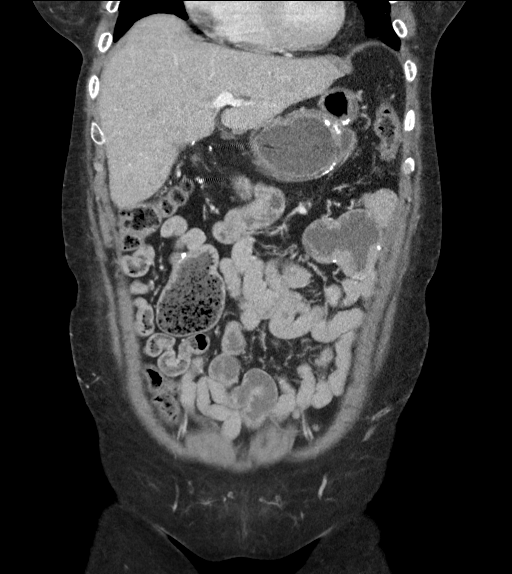
[im 37/84  soft-tissue]
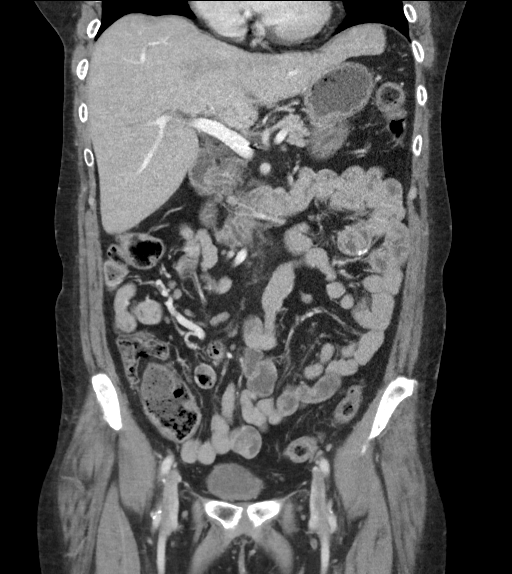
[im 47/84  soft-tissue]
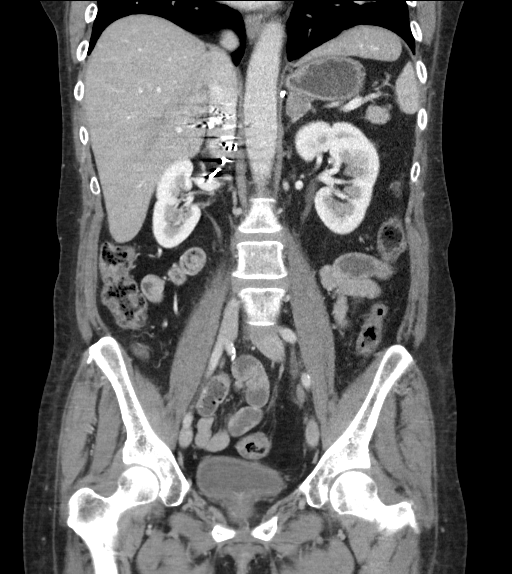

[16 of 46 positions shown; findings below may reference images not displayed]

FINDINGS: Lower chest: No acute abnormality.

Hepatobiliary: No focal liver abnormality is seen. Status post
cholecystectomy. No biliary dilatation.

Pancreas: Unremarkable. No pancreatic ductal dilatation or
surrounding inflammatory changes.

Spleen: Normal in size without focal abnormality.

Adrenals/Urinary Tract: 2.1 cm left adrenal mass, stable with
relative washout of greater than 50%, consistent with an adenoma.
There are surgical vascular clips in the right suprarenal region
with no defined adrenal gland consistent with a previous
adrenalectomy.

Kidneys normal size, orientation and position with no masses, stones
or hydronephrosis. Symmetric enhancement and excretion. Normal
ureters. Normal bladder.

Stomach/Bowel: There are changes from prior gastric surgery and
ligation of the duodenum and formation of a gastrojejunostomy,
consistent with a Billroth 2 procedure. No wall thickening or
adjacent inflammation along the stomach or proximal jejunum. Mild
dilation of the small bowel anastomosis in the left mid abdomen,
which is stable from the prior CT. No small bowel wall thickening or
inflammation. Colon is normal in caliber. No colonic wall thickening
or inflammation.

Vascular/Lymphatic: Mild aortic atherosclerosis. No enlarged lymph
nodes.

Reproductive: Status post hysterectomy. No adnexal masses.

Other: Soft tissue attenuation is noted in the anterior abdominal
wall at and surrounding the umbilicus, stable from the prior CT
consistent with exuberant scarring. No hernia. No ascites.

Musculoskeletal: No acute or significant osseous findings.
IMPRESSION: 1. No acute findings within the abdomen or pelvis.
2. Chronic changes related to prior surgeries including a Billroth 2
procedure, right adrenalectomy and cholecystectomy and additional
bowel anastomoses as well as a hysterectomy. Chronic anterior
abdominal wall scarring.
3. Mild aortic atherosclerosis.

## 2019-10-15 DIAGNOSIS — E039 Hypothyroidism, unspecified: Secondary | ICD-10-CM | POA: Diagnosis not present

## 2019-10-15 DIAGNOSIS — K2100 Gastro-esophageal reflux disease with esophagitis, without bleeding: Secondary | ICD-10-CM | POA: Diagnosis not present

## 2019-10-18 DIAGNOSIS — Z1389 Encounter for screening for other disorder: Secondary | ICD-10-CM | POA: Diagnosis not present

## 2019-10-18 DIAGNOSIS — I10 Essential (primary) hypertension: Secondary | ICD-10-CM | POA: Diagnosis not present

## 2019-10-18 DIAGNOSIS — Z0001 Encounter for general adult medical examination with abnormal findings: Secondary | ICD-10-CM | POA: Diagnosis not present

## 2019-10-18 DIAGNOSIS — S31100A Unspecified open wound of abdominal wall, right upper quadrant without penetration into peritoneal cavity, initial encounter: Secondary | ICD-10-CM | POA: Diagnosis not present

## 2019-10-19 DIAGNOSIS — M329 Systemic lupus erythematosus, unspecified: Secondary | ICD-10-CM | POA: Diagnosis not present

## 2019-10-19 DIAGNOSIS — M3210 Systemic lupus erythematosus, organ or system involvement unspecified: Secondary | ICD-10-CM | POA: Diagnosis not present

## 2019-10-19 DIAGNOSIS — L98492 Non-pressure chronic ulcer of skin of other sites with fat layer exposed: Secondary | ICD-10-CM | POA: Diagnosis not present

## 2019-10-20 DIAGNOSIS — S31109A Unspecified open wound of abdominal wall, unspecified quadrant without penetration into peritoneal cavity, initial encounter: Secondary | ICD-10-CM | POA: Diagnosis not present

## 2019-10-27 DIAGNOSIS — S31109D Unspecified open wound of abdominal wall, unspecified quadrant without penetration into peritoneal cavity, subsequent encounter: Secondary | ICD-10-CM | POA: Diagnosis not present

## 2019-10-27 DIAGNOSIS — E278 Other specified disorders of adrenal gland: Secondary | ICD-10-CM | POA: Diagnosis not present

## 2019-10-27 DIAGNOSIS — R109 Unspecified abdominal pain: Secondary | ICD-10-CM | POA: Diagnosis not present

## 2019-10-27 DIAGNOSIS — Z87891 Personal history of nicotine dependence: Secondary | ICD-10-CM | POA: Diagnosis not present

## 2019-10-27 DIAGNOSIS — R112 Nausea with vomiting, unspecified: Secondary | ICD-10-CM | POA: Diagnosis not present

## 2019-10-27 DIAGNOSIS — I2510 Atherosclerotic heart disease of native coronary artery without angina pectoris: Secondary | ICD-10-CM | POA: Diagnosis not present

## 2019-10-27 DIAGNOSIS — K6289 Other specified diseases of anus and rectum: Secondary | ICD-10-CM | POA: Diagnosis not present

## 2019-10-27 DIAGNOSIS — Z9049 Acquired absence of other specified parts of digestive tract: Secondary | ICD-10-CM | POA: Diagnosis not present

## 2019-10-27 DIAGNOSIS — Z9089 Acquired absence of other organs: Secondary | ICD-10-CM | POA: Diagnosis not present

## 2019-10-27 DIAGNOSIS — K838 Other specified diseases of biliary tract: Secondary | ICD-10-CM | POA: Diagnosis not present

## 2019-10-27 DIAGNOSIS — Z9889 Other specified postprocedural states: Secondary | ICD-10-CM | POA: Diagnosis not present

## 2019-10-27 DIAGNOSIS — D509 Iron deficiency anemia, unspecified: Secondary | ICD-10-CM | POA: Diagnosis not present

## 2019-10-27 DIAGNOSIS — I700 Atherosclerosis of aorta: Secondary | ICD-10-CM | POA: Diagnosis not present

## 2019-11-07 ENCOUNTER — Emergency Department (HOSPITAL_COMMUNITY)
Admission: EM | Admit: 2019-11-07 | Discharge: 2019-11-08 | Disposition: A | Discharge status: Home / Self Care | Payer: Medicare Other | Attending: Emergency Medicine | Admitting: Emergency Medicine

## 2019-11-07 ENCOUNTER — Encounter (HOSPITAL_COMMUNITY): Payer: Self-pay

## 2019-11-07 ENCOUNTER — Other Ambulatory Visit: Payer: Self-pay

## 2019-11-07 ENCOUNTER — Emergency Department (HOSPITAL_COMMUNITY): Payer: Medicare Other

## 2019-11-07 DIAGNOSIS — R112 Nausea with vomiting, unspecified: Secondary | ICD-10-CM | POA: Diagnosis not present

## 2019-11-07 DIAGNOSIS — F17210 Nicotine dependence, cigarettes, uncomplicated: Secondary | ICD-10-CM | POA: Diagnosis not present

## 2019-11-07 DIAGNOSIS — K920 Hematemesis: Secondary | ICD-10-CM

## 2019-11-07 DIAGNOSIS — I2510 Atherosclerotic heart disease of native coronary artery without angina pectoris: Secondary | ICD-10-CM | POA: Diagnosis not present

## 2019-11-07 DIAGNOSIS — K219 Gastro-esophageal reflux disease without esophagitis: Secondary | ICD-10-CM | POA: Insufficient documentation

## 2019-11-07 DIAGNOSIS — D3500 Benign neoplasm of unspecified adrenal gland: Secondary | ICD-10-CM | POA: Diagnosis not present

## 2019-11-07 DIAGNOSIS — R109 Unspecified abdominal pain: Secondary | ICD-10-CM | POA: Diagnosis not present

## 2019-11-07 DIAGNOSIS — R1110 Vomiting, unspecified: Secondary | ICD-10-CM | POA: Diagnosis not present

## 2019-11-07 DIAGNOSIS — Z743 Need for continuous supervision: Secondary | ICD-10-CM | POA: Diagnosis not present

## 2019-11-07 DIAGNOSIS — I10 Essential (primary) hypertension: Secondary | ICD-10-CM | POA: Insufficient documentation

## 2019-11-07 DIAGNOSIS — Z79899 Other long term (current) drug therapy: Secondary | ICD-10-CM | POA: Insufficient documentation

## 2019-11-07 DIAGNOSIS — E039 Hypothyroidism, unspecified: Secondary | ICD-10-CM | POA: Insufficient documentation

## 2019-11-07 DIAGNOSIS — I499 Cardiac arrhythmia, unspecified: Secondary | ICD-10-CM | POA: Diagnosis not present

## 2019-11-07 DIAGNOSIS — R1033 Periumbilical pain: Secondary | ICD-10-CM | POA: Diagnosis not present

## 2019-11-07 DIAGNOSIS — Z8616 Personal history of COVID-19: Secondary | ICD-10-CM | POA: Diagnosis not present

## 2019-11-07 DIAGNOSIS — R1111 Vomiting without nausea: Secondary | ICD-10-CM | POA: Diagnosis not present

## 2019-11-07 DIAGNOSIS — Z9889 Other specified postprocedural states: Secondary | ICD-10-CM | POA: Diagnosis not present

## 2019-11-07 DIAGNOSIS — R58 Hemorrhage, not elsewhere classified: Secondary | ICD-10-CM | POA: Diagnosis not present

## 2019-11-07 DIAGNOSIS — Z7982 Long term (current) use of aspirin: Secondary | ICD-10-CM | POA: Diagnosis not present

## 2019-11-07 DIAGNOSIS — I700 Atherosclerosis of aorta: Secondary | ICD-10-CM | POA: Diagnosis not present

## 2019-11-07 LAB — CBC WITH DIFFERENTIAL/PLATELET
Abs Immature Granulocytes: 0.01 10*3/uL (ref 0.00–0.07)
Basophils Absolute: 0 10*3/uL (ref 0.0–0.1)
Basophils Relative: 1 %
Eosinophils Absolute: 0.1 10*3/uL (ref 0.0–0.5)
Eosinophils Relative: 2 %
HCT: 33.1 % — ABNORMAL LOW (ref 36.0–46.0)
Hemoglobin: 10.8 g/dL — ABNORMAL LOW (ref 12.0–15.0)
Immature Granulocytes: 0 %
Lymphocytes Relative: 57 %
Lymphs Abs: 3.5 10*3/uL (ref 0.7–4.0)
MCH: 24.5 pg — ABNORMAL LOW (ref 26.0–34.0)
MCHC: 32.6 g/dL (ref 30.0–36.0)
MCV: 75.2 fL — ABNORMAL LOW (ref 80.0–100.0)
Monocytes Absolute: 0.5 10*3/uL (ref 0.1–1.0)
Monocytes Relative: 8 %
Neutro Abs: 1.9 10*3/uL (ref 1.7–7.7)
Neutrophils Relative %: 32 %
Platelets: 337 10*3/uL (ref 150–400)
RBC: 4.4 MIL/uL (ref 3.87–5.11)
RDW: 17.5 % — ABNORMAL HIGH (ref 11.5–15.5)
WBC: 6 10*3/uL (ref 4.0–10.5)
nRBC: 0 % (ref 0.0–0.2)

## 2019-11-07 LAB — COMPREHENSIVE METABOLIC PANEL
ALT: 20 U/L (ref 0–44)
AST: 30 U/L (ref 15–41)
Albumin: 3.6 g/dL (ref 3.5–5.0)
Alkaline Phosphatase: 72 U/L (ref 38–126)
Anion gap: 18 — ABNORMAL HIGH (ref 5–15)
BUN: 15 mg/dL (ref 6–20)
CO2: 15 mmol/L — ABNORMAL LOW (ref 22–32)
Calcium: 8.9 mg/dL (ref 8.9–10.3)
Chloride: 102 mmol/L (ref 98–111)
Creatinine, Ser: 0.66 mg/dL (ref 0.44–1.00)
GFR calc Af Amer: >60 mL/min (ref >60)
GFR calc non Af Amer: >60 mL/min (ref >60)
Glucose, Bld: 89 mg/dL (ref 70–99)
Potassium: 3.8 mmol/L (ref 3.5–5.1)
Sodium: 135 mmol/L (ref 135–145)
Total Bilirubin: 0.5 mg/dL (ref 0.3–1.2)
Total Protein: 7.7 g/dL (ref 6.5–8.1)

## 2019-11-07 LAB — URINALYSIS, ROUTINE W REFLEX MICROSCOPIC
Bilirubin Urine: NEGATIVE
Glucose, UA: NEGATIVE mg/dL
Hgb urine dipstick: NEGATIVE
Ketones, ur: NEGATIVE mg/dL
Leukocytes,Ua: NEGATIVE
Nitrite: NEGATIVE
Protein, ur: NEGATIVE mg/dL
Specific Gravity, Urine: 1.009 (ref 1.005–1.030)
pH: 5 (ref 5.0–8.0)

## 2019-11-07 LAB — PROTIME-INR
INR: 1.3 — ABNORMAL HIGH (ref 0.8–1.2)
Prothrombin Time: 15.6 seconds — ABNORMAL HIGH (ref 11.4–15.2)

## 2019-11-07 LAB — POC OCCULT BLOOD, ED: Fecal Occult Bld: NEGATIVE

## 2019-11-07 LAB — APTT: aPTT: 30 seconds (ref 24–36)

## 2019-11-07 LAB — LIPASE, BLOOD: Lipase: 19 U/L (ref 11–51)

## 2019-11-07 MED ORDER — SODIUM CHLORIDE 0.9 % IV BOLUS
1000.0000 mL | Freq: Once | INTRAVENOUS | Status: AC
  Administered 2019-11-07: 1000 mL via INTRAVENOUS

## 2019-11-07 MED ORDER — PROCHLORPERAZINE EDISYLATE 10 MG/2ML IJ SOLN
10.0000 mg | Freq: Once | INTRAMUSCULAR | Status: AC
  Administered 2019-11-07: 10 mg via INTRAVENOUS
  Filled 2019-11-07: qty 2

## 2019-11-07 MED ORDER — FENTANYL CITRATE (PF) 100 MCG/2ML IJ SOLN
100.0000 ug | Freq: Once | INTRAMUSCULAR | Status: AC
  Administered 2019-11-07: 100 ug via INTRAVENOUS
  Filled 2019-11-07: qty 2

## 2019-11-07 MED ORDER — IOHEXOL 300 MG/ML  SOLN
100.0000 mL | Freq: Once | INTRAMUSCULAR | Status: AC | PRN
  Administered 2019-11-07: 100 mL via INTRAVENOUS

## 2019-11-07 MED ORDER — PANTOPRAZOLE SODIUM 20 MG PO TBEC: 20.0000 mg | DELAYED_RELEASE_TABLET | Freq: Every day | ORAL | 0 refills | Status: DC

## 2019-11-07 MED ORDER — LORAZEPAM 2 MG/ML IJ SOLN
1.0000 mg | Freq: Once | INTRAMUSCULAR | Status: AC
  Administered 2019-11-07: 1 mg via INTRAVENOUS
  Filled 2019-11-07: qty 1

## 2019-11-07 NOTE — ED Provider Notes (Signed)
[facility]ERGENCY DEPARTMENT Provider Note   CSN: [id number] Arrival date & time: 11/07/19  1843     History Chief Complaint  Patient presents with  . Abdominal Pain  . Hematemesis    Angel Price is a 54 y.o. female.  HPI   54 year old female with soft tissue abscess, adrenal mass, alcohol abuse, anxiety/depression, diverticulosis, drug-seeking behavior, gastritis, gastroparesis, GERD, hiatal hernia, Billroth II operation, hypertension, lupus, malingering, pancreatitis, Schatzki's ring, sickle cell trait, recent hernia repair, presenting to the emergency department today for evaluation of abdominal pain.  States that she has chronic abdominal pain at baseline however today she woke up and had severe pain and then vomited blood.  Since then she has been nauseated and has continued to dry heave.  She has not had any dark or tarry stools.  She has not had any fevers or urinary symptoms.  Past Medical History:  Diagnosis Date  . Abscess    soft tissue  . Adrenal mass ([place])   . Alcohol abuse   . Anxiety   . Blood transfusion without reported diagnosis   . Chronic abdominal pain   . Chronic wound infection of abdomen   . Colon polyp    colonoscopy 04/2014  . Depression   . Diverticulosis    colonoscopy 04/2014 moderat pan colonic  . Drug-seeking behavior   . Gastritis    EGD 05/2014  . Gastroparesis Nov 2015  . GERD (gastroesophageal reflux disease)   . Hemorrhoid    internal large  . Hiatal hernia   . History of Billroth II operation   . Hypertension   . Lung nodule    CT 02/2014 needs repeat 1 month  . Lung nodule < 6cm on CT 04/25/2014  . Lupus ([place])   . Malingering   . Nausea and vomiting    chronic, recurrent  . Pancreatitis   . Schatzki's ring    patent per EGD 04/2014  . Sickle cell trait ([place])   . Suicide attempt ([place])   . 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  Coronary artery disease involving native coronary artery of native heart without angina pectoris 06/25/2019  . Shortness of breath 06/15/2019  . Chest pain 06/15/2019  . Acute respiratory failure with hypoxia ([place]) 05/20/2019  . Essential hypertension 05/20/2019  . COVID-19 05/20/2019  . Syncope and collapse 05/20/2019  . Right sided weakness 05/16/2019  . Metabolic acidosis 37/90/2409  . Intractable vomiting with nausea 02/23/2019  . Abnormal CT scan, colon 12/09/2018  . Abnormal computed tomography of cecum and terminal ileum 12/09/2018  . Cocaine abuse ([place])   . Colitis 11/11/2018  . Hemorrhoidal skin tag   . SBO (small bowel obstruction) ([place])   . Non-intractable vomiting with nausea   . Folate deficiency 05/11/2017  . Abdominal wall fistula   . Cellulitis of abdominal wall 01/02/2017  . Acute left hemiparesis ([place]) 12/05/2016  . Malingering 12/05/2016  . Weakness of left lower extremity 12/02/2016  . CVA (cerebral vascular accident) ([place]) 11/23/2016  . Bright red rectal bleeding 08/22/2016  . Hypotension due to blood loss   . Acute GI bleeding 05/24/2016  . Dieulafoy lesion of duodenum   . History of Billroth II operation   . Gastrointestinal hemorrhage 05/22/2016  . Absolute anemia   . Acute pancreatitis 10/01/2015  . Abnormal CT scan of lung 10/01/2015  . Alcohol abuse with intoxication ([place])   .  Left-sided weakness   . Psychosomatic factor in physical condition   . Upper GI bleed   . Diverticulosis of colon with hemorrhage   . Chronic wound infection of abdomen 12/22/2014  . 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  . HTN (hypertension) 12/22/2014  . Ataxia 11/01/2014  . Hemorrhoids 04/26/2014  . Lung nodule 04/26/2014  . Diverticulosis   . Gastritis   . Hiatal hernia   . Schatzki's ring   . Acute blood loss anemia 04/25/2014  . Sinus tachycardia 04/25/2014  . Hypokalemia 04/25/2014  . Hematemesis with nausea 04/25/2014  . Lung nodule < 6cm on CT 04/25/2014  .  Intractable nausea and vomiting 04/24/2014  . Gastroparesis   . Abdominal pain 04/01/2014  . Chronic abdominal pain 01/21/2014  . Gastroenteritis 12/10/2013  . Chronic abdominal wound infection 08/16/2013  . MDD (major depressive disorder), recurrent episode, severe ([place]) 06/27/2013  . Wrist laceration 06/24/2013  . Diarrhea 05/07/2013  . Rectal bleeding 05/07/2013  . Abnormal LFTs 05/07/2013  . Adrenal mass, left ([place]) 05/07/2013  . Abdominal wall abscess at site of surgical wound 04/19/2013  . Abdominal wall abscess 04/18/2013  . Frequent headaches 03/30/2013  . Sleep difficulties 03/30/2013  . Essential hypertension, benign 03/30/2013  . History of cocaine abuse ([place]) 03/16/2013  . History of schizoaffective disorder 03/16/2013  . Bipolar disorder ([place]) 03/16/2013  . Personality disorder ([place]) 03/16/2013  . Tobacco abuse 03/16/2013  . Alcohol abuse 03/16/2013  . Palpitations 03/16/2013  . Poor vision 03/16/2013  . History of gastric bypass 03/16/2013  . Status post hysterectomy with oophorectomy 03/16/2013  . Hypothyroid 03/16/2013  . Lupus ([place]) 03/16/2013    Past Surgical History:  Procedure Laterality Date  . ABDOMINAL HYSTERECTOMY  2013   [place]  . ABDOMINAL HYSTERECTOMY    . ABDOMINAL SURGERY    . ADRENALECTOMY Right   . AGILE CAPSULE N/A 01/05/2015   Procedure: AGILE CAPSULE;  Surgeon: Daneil Dolin, MD;  Location: [facility];  Service: Endoscopy;  Laterality: N/A;  0700  . Billroth II procedure      [place], first 2000, 2005/2006.  . bilroth 2    . BIOPSY  05/20/2013   Procedure: BIOPSIES OF ASCENDING AND SIGMOID COLON;  Surgeon: Daneil Dolin, MD;  Location: [facility];  Service: Endoscopy;;  . BIOPSY  04/26/2014   Procedure: BIOPSIES;  Surgeon: Danie Binder, MD;  Location: [facility];  Service: Endoscopy;;  . BIOPSY  12/24/2018   Procedure: BIOPSY;  Surgeon: Daneil Dolin, MD;  Location: [facility];  Service: Endoscopy;;  colon  . CHOLECYSTECTOMY    .  COLONOSCOPY     in [place]  . COLONOSCOPY WITH PROPOFOL N/A 05/20/2013   Dr.Rourk- inadequate prep, normal appearing rectum, grossly normal colon aside from pancolonic diverticula, normal terminal ileum bx= unremarkable colonic mucosa. Due for early interval 2016.   Marland Kitchen COLONOSCOPY WITH PROPOFOL N/A 04/26/2014   IOE:VOJJKK ileum/one colon polyp removed/moderate pan-colonic diverticulosis/large internal hemorrhoids  . COLONOSCOPY WITH PROPOFOL N/A 12/23/2014   Dr.Rourk- minimal internal hemorrhoids, pancolonic diverticulosis  . COLONOSCOPY WITH PROPOFOL N/A 08/23/2016   Procedure: COLONOSCOPY WITH PROPOFOL;  Surgeon: Danie Binder, MD;  [facility];  Service: Endoscopy;  Laterality: N/A;  . COLONOSCOPY WITH PROPOFOL N/A 12/24/2018   Pancolonic diverticulosis, normal TI, one 5 mm polyp in rectum (tubular adenoma). Somewhat friable hemorrhagic mucosa in area of IC valve/cecum, possibly related to scope trauma s/p biopsy.   . DEBRIDEMENT OF ABDOMINAL WALL ABSCESS N/A 02/08/2013  Procedure: DEBRIDEMENT OF ABDOMINAL WALL ABSCESS;  Surgeon: Jamesetta So, MD;  Location: [facility];  Service: General;  Laterality: N/A;  . ESOPHAGOGASTRODUODENOSCOPY (EGD) WITH PROPOFOL N/A 05/20/2013   Dr.Rourk- s/p prior gastric surgery with normal esophagus, residual gastric mucosa and patent efferent limb  . ESOPHAGOGASTRODUODENOSCOPY (EGD) WITH PROPOFOL N/A 02/03/2014   Dr. Gala Romney:  s/p hemigastrectomy with retained gastric contents. Residual gastric mucosa and efferent limb appeared normal otherwise. Query gastroparesis.   Marland Kitchen ESOPHAGOGASTRODUODENOSCOPY (EGD) WITH PROPOFOL N/A 04/26/2014   HUD:JSHFWYOV'Z ring/small HH/mild non-erosive gasrtitis/normal anastomosis  . ESOPHAGOGASTRODUODENOSCOPY (EGD) WITH PROPOFOL N/A 12/23/2014   Dr.Rourk- s/p prior hemigastrctomy, active oozing from anastomotic suture site, hemostasis achieved  . ESOPHAGOGASTRODUODENOSCOPY (EGD) WITH PROPOFOL N/A 05/23/2016   Dr. Oneida Alar while inpatient:  red blood at anastomosis, s/p epi injection and clips, likely secondary to Dieulafoy's lesion at anastomosis   . ESOPHAGOGASTRODUODENOSCOPY (EGD) WITH PROPOFOL N/A 05/11/2017   Procedure: ESOPHAGOGASTRODUODENOSCOPY (EGD) WITH PROPOFOL;  Surgeon: Daneil Dolin, MD;  [facility];  Service: Endoscopy;  Laterality: N/A;  . HEMORRHOID SURGERY N/A 06/18/2017   Procedure: THREE COLUMN EXTENSIVE HEMORRHOIDECTOMY;  Surgeon: Virl Cagey, MD;  Location: [facility];  Service: General;  Laterality: N/A;  . INCISION AND DRAINAGE ABSCESS N/A 01/06/2017   Procedure: INCISION AND DRAINAGE ABDOMINAL WALL ABSCESS;  Surgeon: Aviva Signs, MD;  Location: [facility];  Service: General;  Laterality: N/A;  . POLYPECTOMY  12/24/2018   Procedure: POLYPECTOMY;  Surgeon: Daneil Dolin, MD;  [facility];  Service: Endoscopy;;  colon  . tendon repar Right    wrist  . WOUND EXPLORATION Right 06/24/2013   Procedure: exploration of traumatic wound right wrist;  Surgeon: Tennis Must, MD;  Location: [facility];  Service: Orthopedics;  Laterality: Right;     OB History    Gravida  4   Para  4   Term  4   Preterm  0   AB  0   Living        SAB  0   TAB  0   Ectopic  0   Multiple      Live Births              Family History  Problem Relation Age of Onset  . Lung cancer Father   . Cancer Father        mets  . Brain cancer Son   . Schizophrenia Son   . Cancer Son        brain  . Drug abuse Mother   . Breast cancer Maternal Aunt   . Bipolar disorder Maternal Aunt   . Drug abuse Maternal Aunt   . Colon cancer Maternal Grandmother        late 41s, early 36s  . Drug abuse Sister   . Drug abuse Brother   . Bipolar disorder Paternal Grandfather   . Bipolar disorder Cousin   . Liver disease Neg Hx     Social History   Tobacco Use  . Smoking status: Current Every Day Smoker    Packs/day: 0.50    Years: 35.00    Pack years: 17.50    Types: Cigarettes  . Smokeless tobacco:  Never Used  Vaping Use  . Vaping Use: Never used  Substance Use Topics  . Alcohol use: Not Currently  . Drug use: Not Currently    Types: Cocaine    Home Medications Prior to Admission medications   Medication Sig Start Date End  Date Taking? Authorizing Provider  acetaminophen (TYLENOL) 500 MG tablet Take 2 tablets (1,000 mg total) by mouth 3 (three) times daily as needed for mild pain or moderate pain. 02/26/19   Enzo Bi, MD  amLODipine (NORVASC) 10 MG tablet Take 1 tablet (10 mg total) by mouth daily. 06/16/19   Pokhrel, Corrie Mckusick, MD  aspirin EC 81 MG EC tablet Take 1 tablet (81 mg total) by mouth daily. 05/19/19   Elgergawy, Silver Huguenin, MD  atenolol (TENORMIN) 50 MG tablet Take 50 mg by mouth daily. 02/01/19   [provider]  buPROPion (WELLBUTRIN SR) 150 MG 12 hr tablet Take by mouth. 07/27/19   [provider]  dicyclomine (BENTYL) 10 MG capsule Take 1 capsule (10 mg total) by mouth 4 (four) times daily -  before meals and at bedtime. For diarrhea and abdominal cramps 12/09/18   Mahala Menghini, PA-C  diphenhydrAMINE (BENADRYL) 25 MG tablet Take 25 mg by mouth every 6 (six) hours as needed for sleep.    [provider]  doxycycline (VIBRAMYCIN) 100 MG capsule Take 1 capsule (100 mg total) by mouth 2 (two) times daily. 06/17/19   Charlann Lange, PA-C  ergocalciferol (VITAMIN D2) 1.25 MG (50000 UT) capsule Take by mouth. 10/10/19 11/22/19  [provider]  escitalopram (LEXAPRO) 20 MG tablet Take 20 mg daily by mouth.    [provider]  ferrous gluconate (FERGON) 324 MG tablet Take by mouth. 10/04/19 12/03/19  [provider]  folic acid (FOLVITE) 1 MG tablet Take 1 tablet (1 mg total) by mouth daily. 11/13/18   Roxan Hockey, MD  nicotine (NICODERM CQ - DOSED IN MG/24 HOURS) 14 mg/24hr patch Place 1 patch (14 mg total) onto the skin daily. 05/19/19   Elgergawy, Silver Huguenin, MD  ondansetron (ZOFRAN) 4 MG tablet Take 1 tablet (4 mg total) by mouth  every 6 (six) hours as needed for nausea. 3/81/82   Delora Fuel, MD  pantoprazole (PROTONIX) 20 MG tablet Take 1 tablet (20 mg total) by mouth daily. 11/07/19   Earsie Humm S, PA-C  promethazine (PHENERGAN) 25 MG suppository Place 1 suppository (25 mg total) rectally every 6 (six) hours as needed for nausea or vomiting. 01/27/19   McDonald, Mia A, PA-C  promethazine (PHENERGAN) 25 MG tablet Take 25 mg by mouth every 6 (six) hours as needed for nausea or vomiting.    [provider]  rosuvastatin (CRESTOR) 20 MG tablet Take 1 tablet (20 mg total) by mouth daily. 06/25/19 10/07/19  Patwardhan, Reynold Bowen, MD  sucralfate (CARAFATE) 1 g tablet Take 1 g by mouth 4 (four) times daily -  with meals and at bedtime.    [provider]  thiamine 100 MG tablet Take 1 tablet (100 mg total) by mouth daily. 11/13/18   Roxan Hockey, MD  vitamin B-12 (CYANOCOBALAMIN) 1000 MCG tablet Take 2,000 mcg by mouth daily.     [provider]    Allergies    Metoclopramide, Codeine, Morphine and related, and Reglan [metoclopramide]  Review of Systems   Review of Systems  Constitutional: Negative for chills and fever.  HENT: Negative for ear pain and sore throat.   Eyes: Negative for visual disturbance.  Respiratory: Negative for shortness of breath.   Cardiovascular: Negative for chest pain.  Gastrointestinal: Positive for abdominal pain, diarrhea and vomiting. Negative for blood in stool.       Hematemesis  Genitourinary: Negative for dysuria and hematuria.  Musculoskeletal: Negative for arthralgias and back pain.  Skin: Negative for rash.  Neurological: Negative for headaches.  All other systems reviewed and are negative.   Physical Exam Updated Vital Signs BP (!) 125/93   Pulse (!) 53   Temp 98.2 F (36.8 C) (Oral)   Resp 14   Ht 5\' 3"  (1.6 m)   Wt 66.7 kg   LMP  (LMP Unknown)   SpO2 97%   BMI 26.04 kg/m   Physical Exam Vitals and nursing note reviewed.   Constitutional:      General: She is not in acute distress.    Appearance: She is well-developed.  HENT:     Head: Normocephalic and atraumatic.  Eyes:     Conjunctiva/sclera: Conjunctivae normal.  Cardiovascular:     Rate and Rhythm: Regular rhythm. Tachycardia present.     Heart sounds: No murmur heard.   Pulmonary:     Effort: Pulmonary effort is normal. No respiratory distress.     Breath sounds: Normal breath sounds.  Abdominal:     General: A surgical scar is present.     Palpations: Abdomen is soft.     Tenderness: There is abdominal tenderness (to the periumbilical area near priod surgical scar).     Comments: Chronic wound to abd wall that appears to be well healing. Dry heaving on exam  Musculoskeletal:     Cervical back: Neck supple.  Skin:    General: Skin is warm and dry.  Neurological:     Mental Status: She is alert.     ED Results / Procedures / Treatments   Labs (all labs ordered are listed, but only abnormal results are displayed) Labs Reviewed  CBC WITH DIFFERENTIAL/PLATELET - Abnormal; Notable for the following components:      Result Value   Hemoglobin 10.8 (*)    HCT 33.1 (*)    MCV 75.2 (*)    MCH 24.5 (*)    RDW 17.5 (*)    All other components within normal limits  COMPREHENSIVE METABOLIC PANEL - Abnormal; Notable for the following components:   CO2 15 (*)    Anion gap 18 (*)    All other components within normal limits  PROTIME-INR - Abnormal; Notable for the following components:   Prothrombin Time 15.6 (*)    INR 1.3 (*)    All other components within normal limits  URINALYSIS, ROUTINE W REFLEX MICROSCOPIC - Abnormal; Notable for the following components:   Color, Urine STRAW (*)    All other components within normal limits  LIPASE, BLOOD  APTT  POC OCCULT BLOOD, ED  I-STAT CHEM 8, ED  TYPE AND SCREEN    EKG None  Radiology CT ABDOMEN PELVIS W CONTRAST  Result Date: 11/07/2019 CLINICAL DATA:  Abdominal pain, hematemesis,  nausea and vomiting EXAM: CT ABDOMEN AND PELVIS WITH CONTRAST TECHNIQUE: Multidetector CT imaging of the abdomen and pelvis was performed using the standard protocol following bolus administration of intravenous contrast. CONTRAST:  151mL OMNIPAQUE IOHEXOL 300 MG/ML  SOLN COMPARISON:  07/13/2019 FINDINGS: Lower chest: No acute pleural or parenchymal lung disease. Hepatobiliary: No focal liver abnormality is seen. Status post cholecystectomy. No biliary dilatation. Pancreas: Unremarkable. No pancreatic ductal dilatation or surrounding inflammatory changes. Spleen: Normal in size without focal abnormality. Adrenals/Urinary Tract: Stable left adrenal adenoma measuring 1.8 cm. The right adrenal is surgically absent. The kidneys enhance normally and symmetrically. No evidence of urinary tract calculi. Mild distension of the bilateral ureters is likely due to moderate bladder distension. No filling defects within the urinary bladder.  Stomach/Bowel: No bowel obstruction or ileus. Postsurgical changes are seen from distal gastrectomy with gastrojejunostomy. No bowel wall thickening or inflammatory change. Vascular/Lymphatic: Aortic atherosclerosis. No enlarged abdominal or pelvic lymph nodes. Reproductive: Status post hysterectomy. No adnexal masses. Other: No free fluid or free gas. Postsurgical changes from prior midline laparotomy, with stable abdominal wall scar and small seroma. Musculoskeletal: No acute or destructive bony lesions. Reconstructed images demonstrate no additional findings. IMPRESSION: 1. No acute intra-abdominal or intrapelvic process. 2. Stable postsurgical changes as above. 3. Aortic Atherosclerosis (ICD10-I70.0). Electronically Signed   By: Randa Ngo M.D.   On: 11/07/2019 22:12    Procedures Procedures (including critical care time)  Medications Ordered in ED Medications  prochlorperazine (COMPAZINE) injection 10 mg (10 mg Intravenous Given 11/07/19 1955)  fentaNYL (SUBLIMAZE) injection  100 mcg (100 mcg Intravenous Given 11/07/19 1954)  sodium chloride 0.9 % bolus 1,000 mL (0 mLs Intravenous Stopped 11/07/19 2100)  iohexol (OMNIPAQUE) 300 MG/ML solution 100 mL (100 mLs Intravenous Contrast Given 11/07/19 2145)  LORazepam (ATIVAN) injection 1 mg (1 mg Intravenous Given 11/07/19 2315)  fentaNYL (SUBLIMAZE) injection 100 mcg (100 mcg Intravenous Given 11/07/19 2316)  sodium chloride 0.9 % bolus 1,000 mL (1,000 mLs Intravenous New Bag/Given 11/07/19 2314)    ED Course  I have reviewed the triage vital signs and the nursing notes.  Pertinent labs & imaging results that were available during my care of the patient were reviewed by me and considered in my medical decision making (see chart for details).    MDM Rules/Calculators/A&P                          54 year old female presenting for evaluation of abdominal pain and one episode of hematemesis.  Has had no episodes of hematemesis while in the ED.  She has been a little bit at tachycardic but does not have any hypotension.  I question whether her tachycardia is related to mild alcohol withdrawal as she drinks daily and has not had any alcohol today.  She also looks mildly dehydrated.  Reviewed her labs.  She has mild anemia that appears stable from baseline.  She does not have any significant electrolyte derangement.  Her liver function tests and kidney function tests are normal.  Her lipase is negative.  Her UA is negative.  The CT scan of her abdomen/pelvis does not show any acute findings.  Discussed case with Dr. Dr. Dema Severin with general surgery who has low suspicion for internal hernia from prior Roux-en-Y given no evidence of bowel obstruction on CT scan and states that if patient can tolerate p.o. there is a very low likelihood for this.  Also discussed case with Dr. Ron Parker, supervising physician who recommending consulting GI.  10:58 PM consult with Dr. Paulita Fujita with gastroenterology who stated that if patient is hemodynamically stable,  well-appearing and has only had one episode of hematemesis that has since resolved with negative fecal occult and no significant anemia she is likely safe for discharge with close follow-up with her outpatient gastroenterologist.  He did review her chart and noted that she has had multiple EGDs without any evidence of varices.  She has had some gastritis in the past.  Reassessed patient.  She was able to tolerate a few small sips of water.  She still complaining of pain and nausea.  She is a little bit tachycardic.  I will order some Ativan given she has a borderline prolonged QTC on EKG.  I  will also order additional pain medications and IV fluids.  I will also add a Chem-8 to rule out any change in her hemoglobin.  At shift change, the patient was signed out to Consuello Bossier, PA-C with plan to follow-up on pending lab and reevaluation.  If labs stable and patient improving she can be discharged with a PPI and outpatient follow-up with her GI doctor.  Final Clinical Impression(s) / ED Diagnoses Final diagnoses:  Nausea and vomiting, intractability of vomiting not specified, unspecified vomiting type  Abdominal pain, unspecified abdominal location  Hematemesis, presence of nausea not specified    Rx / DC Orders ED Discharge Orders         Ordered    pantoprazole (PROTONIX) 20 MG tablet  Daily        11/07/19 2326           Rodney Booze, PA-C 11/07/19 2327    Breck Coons, MD 11/08/19 [phone]

## 2019-11-07 NOTE — ED Notes (Signed)
Patient transported to CT 

## 2019-11-07 NOTE — ED Notes (Addendum)
Pts s/o at bedside, will continue to monitor.

## 2019-11-07 NOTE — Discharge Instructions (Addendum)
You were given a prescription for Protonix and carafate.  Please take as directed.  You will need to follow-up with your outpatient gastroenterologist.  Please call the office on Monday to schedule an appointment for follow-up.

## 2019-11-07 NOTE — ED Triage Notes (Signed)
Pt arrived via EMS from home c/o hematemesis and abdominal pain this afternoon. Pt has hx of gastric cancer, and abdominal mesh replacement in June.

## 2019-11-08 LAB — I-STAT CHEM 8, ED
BUN: 12 mg/dL (ref 6–20)
Calcium, Ion: 1.02 mmol/L — ABNORMAL LOW (ref 1.15–1.40)
Chloride: 114 mmol/L — ABNORMAL HIGH (ref 98–111)
Creatinine, Ser: 0.6 mg/dL (ref 0.44–1.00)
Glucose, Bld: 76 mg/dL (ref 70–99)
HCT: 29 % — ABNORMAL LOW (ref 36.0–46.0)
Hemoglobin: 9.9 g/dL — ABNORMAL LOW (ref 12.0–15.0)
Potassium: 3.9 mmol/L (ref 3.5–5.1)
Sodium: 143 mmol/L (ref 135–145)
TCO2: 17 mmol/L — ABNORMAL LOW (ref 22–32)

## 2019-11-08 MED ORDER — SUCRALFATE 1 GM/10ML PO SUSP: 1.0000 g | Freq: Three times a day (TID) | ORAL | 0 refills | Status: DC

## 2019-11-08 NOTE — ED Provider Notes (Signed)
  Physical Exam  BP 114/71   Pulse (!) 108   Temp 98.2 F (36.8 C) (Oral)   Resp 16   Ht 5\' 3"  (1.6 m)   Wt 66.7 kg   LMP  (LMP Unknown)   SpO2 95%   BMI 26.04 kg/m   Physical Exam  Gen: nontoxic MSK: ambulatory without difficulty CV: mildly tachy at 104.   ED Course/Procedures     Procedures  MDM   Pt signed out to me by C Couture, PA-C. Please see previous notes for further history.   In brief, pt presenting for abd pain and one episode of hemetemesis. She has complicated intraabd history (gastric CA, roux en y, chronic abd wound). Labs overall reassuring,. CT negative. Discussed with Dr. Paulita Fujita from GI, who does not feel pt needs emergent hospitalization, and can be f/u OP. On recheck, pt with a HR in the 120's when sleeping/resting. As such, will recheck hgb, give fluids, and ativan (daily etoh use, consider early withdrawal).   Repeat hgb 9.9. while this is slightly down from earlier, pt has received fluids which likely contributed to dilution. HR improved, 104 on my exam. BP stable, pt ambulatory without difficulty. She tolerated in the ED. Has had no hematemesis in 6 hrs. Discussed with pt. Discussed close f/u with GI. Will d/c with protonix and carafate. At this time, pt appears safe for d/c. Return precautions given. At this time, pt appears safe for d/c. Return precautions given. Pt states she understands and agrees to plan.        Franchot Heidelberg, PA-C 11/08/19 0103    Merrily Pew, MD 11/08/19 [place] [place]

## 2019-11-08 NOTE — ED Notes (Signed)
Verbalized understanding of DC instructions, Rx, follow up care 

## 2019-11-10 ENCOUNTER — Emergency Department (HOSPITAL_COMMUNITY): Payer: Medicare Other

## 2019-11-10 ENCOUNTER — Ambulatory Visit: Payer: Medicare Other | Admitting: Gastroenterology

## 2019-11-10 ENCOUNTER — Emergency Department (HOSPITAL_COMMUNITY)
Admission: EM | Admit: 2019-11-10 | Discharge: 2019-11-10 | Disposition: A | Discharge status: Home / Self Care | Payer: Medicare Other | Attending: Emergency Medicine | Admitting: Emergency Medicine

## 2019-11-10 ENCOUNTER — Encounter (HOSPITAL_COMMUNITY): Payer: Self-pay | Admitting: *Deleted

## 2019-11-10 ENCOUNTER — Other Ambulatory Visit: Payer: Self-pay

## 2019-11-10 DIAGNOSIS — R109 Unspecified abdominal pain: Secondary | ICD-10-CM | POA: Diagnosis not present

## 2019-11-10 DIAGNOSIS — I2510 Atherosclerotic heart disease of native coronary artery without angina pectoris: Secondary | ICD-10-CM | POA: Diagnosis not present

## 2019-11-10 DIAGNOSIS — I10 Essential (primary) hypertension: Secondary | ICD-10-CM | POA: Diagnosis not present

## 2019-11-10 DIAGNOSIS — R29818 Other symptoms and signs involving the nervous system: Secondary | ICD-10-CM | POA: Diagnosis not present

## 2019-11-10 DIAGNOSIS — R0789 Other chest pain: Secondary | ICD-10-CM | POA: Diagnosis not present

## 2019-11-10 DIAGNOSIS — F17210 Nicotine dependence, cigarettes, uncomplicated: Secondary | ICD-10-CM | POA: Insufficient documentation

## 2019-11-10 DIAGNOSIS — K920 Hematemesis: Secondary | ICD-10-CM | POA: Diagnosis not present

## 2019-11-10 DIAGNOSIS — R079 Chest pain, unspecified: Secondary | ICD-10-CM | POA: Diagnosis not present

## 2019-11-10 DIAGNOSIS — R531 Weakness: Secondary | ICD-10-CM | POA: Diagnosis not present

## 2019-11-10 DIAGNOSIS — G8929 Other chronic pain: Secondary | ICD-10-CM

## 2019-11-10 DIAGNOSIS — Z7982 Long term (current) use of aspirin: Secondary | ICD-10-CM | POA: Insufficient documentation

## 2019-11-10 DIAGNOSIS — R1033 Periumbilical pain: Secondary | ICD-10-CM | POA: Diagnosis not present

## 2019-11-10 DIAGNOSIS — R000 Tachycardia, unspecified: Secondary | ICD-10-CM | POA: Diagnosis not present

## 2019-11-10 DIAGNOSIS — Z79899 Other long term (current) drug therapy: Secondary | ICD-10-CM | POA: Diagnosis not present

## 2019-11-10 DIAGNOSIS — K219 Gastro-esophageal reflux disease without esophagitis: Secondary | ICD-10-CM | POA: Diagnosis not present

## 2019-11-10 DIAGNOSIS — I700 Atherosclerosis of aorta: Secondary | ICD-10-CM | POA: Diagnosis not present

## 2019-11-10 LAB — PROTIME-INR
INR: 1.3 — ABNORMAL HIGH (ref 0.8–1.2)
Prothrombin Time: 15.4 seconds — ABNORMAL HIGH (ref 11.4–15.2)

## 2019-11-10 LAB — CBC WITH DIFFERENTIAL/PLATELET
Abs Immature Granulocytes: 0 10*3/uL (ref 0.00–0.07)
Basophils Absolute: 0 10*3/uL (ref 0.0–0.1)
Basophils Relative: 1 %
Eosinophils Absolute: 0.1 10*3/uL (ref 0.0–0.5)
Eosinophils Relative: 2 %
HCT: 34 % — ABNORMAL LOW (ref 36.0–46.0)
Hemoglobin: 11.4 g/dL — ABNORMAL LOW (ref 12.0–15.0)
Immature Granulocytes: 0 %
Lymphocytes Relative: 62 %
Lymphs Abs: 2.8 10*3/uL (ref 0.7–4.0)
MCH: 24.9 pg — ABNORMAL LOW (ref 26.0–34.0)
MCHC: 33.5 g/dL (ref 30.0–36.0)
MCV: 74.4 fL — ABNORMAL LOW (ref 80.0–100.0)
Monocytes Absolute: 0.4 10*3/uL (ref 0.1–1.0)
Monocytes Relative: 9 %
Neutro Abs: 1.2 10*3/uL — ABNORMAL LOW (ref 1.7–7.7)
Neutrophils Relative %: 26 %
Platelets: 344 10*3/uL (ref 150–400)
RBC: 4.57 MIL/uL (ref 3.87–5.11)
RDW: 17.8 % — ABNORMAL HIGH (ref 11.5–15.5)
WBC: 4.4 10*3/uL (ref 4.0–10.5)
nRBC: 0 % (ref 0.0–0.2)

## 2019-11-10 LAB — URINALYSIS, ROUTINE W REFLEX MICROSCOPIC
Bilirubin Urine: NEGATIVE
Glucose, UA: NEGATIVE mg/dL
Hgb urine dipstick: NEGATIVE
Ketones, ur: NEGATIVE mg/dL
Leukocytes,Ua: NEGATIVE
Nitrite: NEGATIVE
Protein, ur: NEGATIVE mg/dL
Specific Gravity, Urine: 1.011 (ref 1.005–1.030)
pH: 5 (ref 5.0–8.0)

## 2019-11-10 LAB — TROPONIN I (HIGH SENSITIVITY)
Troponin I (High Sensitivity): 2 ng/L (ref <18)
Troponin I (High Sensitivity): 2 ng/L (ref <18)

## 2019-11-10 LAB — RAPID URINE DRUG SCREEN, HOSP PERFORMED
Amphetamines: NOT DETECTED
Barbiturates: NOT DETECTED
Benzodiazepines: NOT DETECTED
Cocaine: NOT DETECTED
Opiates: NOT DETECTED
Tetrahydrocannabinol: NOT DETECTED

## 2019-11-10 LAB — COMPREHENSIVE METABOLIC PANEL
ALT: 23 U/L (ref 0–44)
AST: 31 U/L (ref 15–41)
Albumin: 4 g/dL (ref 3.5–5.0)
Alkaline Phosphatase: 74 U/L (ref 38–126)
Anion gap: 14 (ref 5–15)
BUN: 7 mg/dL (ref 6–20)
CO2: 19 mmol/L — ABNORMAL LOW (ref 22–32)
Calcium: 8.9 mg/dL (ref 8.9–10.3)
Chloride: 107 mmol/L (ref 98–111)
Creatinine, Ser: 0.57 mg/dL (ref 0.44–1.00)
GFR calc Af Amer: >60 mL/min (ref >60)
GFR calc non Af Amer: >60 mL/min (ref >60)
Glucose, Bld: 91 mg/dL (ref 70–99)
Potassium: 3.5 mmol/L (ref 3.5–5.1)
Sodium: 140 mmol/L (ref 135–145)
Total Bilirubin: 0.5 mg/dL (ref 0.3–1.2)
Total Protein: 7.9 g/dL (ref 6.5–8.1)

## 2019-11-10 LAB — APTT: aPTT: 29 seconds (ref 24–36)

## 2019-11-10 LAB — CBG MONITORING, ED: Glucose-Capillary: 85 mg/dL (ref 70–99)

## 2019-11-10 LAB — ETHANOL: Alcohol, Ethyl (B): 105 mg/dL — ABNORMAL HIGH (ref <10)

## 2019-11-10 LAB — LIPASE, BLOOD: Lipase: 19 U/L (ref 11–51)

## 2019-11-10 MED ORDER — FENTANYL CITRATE (PF) 100 MCG/2ML IJ SOLN
100.0000 ug | Freq: Once | INTRAMUSCULAR | Status: AC
  Administered 2019-11-10: 100 ug via INTRAVENOUS
  Filled 2019-11-10: qty 2

## 2019-11-10 MED ORDER — SODIUM CHLORIDE 0.9 % IV BOLUS
1000.0000 mL | Freq: Once | INTRAVENOUS | Status: AC
  Administered 2019-11-10: 1000 mL via INTRAVENOUS

## 2019-11-10 MED ORDER — ONDANSETRON HCL 4 MG/2ML IJ SOLN
4.0000 mg | Freq: Once | INTRAMUSCULAR | Status: AC
  Administered 2019-11-10: 4 mg via INTRAVENOUS
  Filled 2019-11-10: qty 2

## 2019-11-10 MED ORDER — ACETAMINOPHEN 325 MG PO TABS
650.0000 mg | ORAL_TABLET | Freq: Once | ORAL | Status: AC
  Administered 2019-11-10: 650 mg via ORAL
  Filled 2019-11-10: qty 2

## 2019-11-10 MED ORDER — PROMETHAZINE HCL 25 MG/ML IJ SOLN
12.5000 mg | Freq: Once | INTRAMUSCULAR | Status: AC
  Administered 2019-11-10: 12.5 mg via INTRAVENOUS
  Filled 2019-11-10: qty 1

## 2019-11-10 MED ORDER — LORAZEPAM 2 MG/ML IJ SOLN
0.5000 mg | Freq: Once | INTRAMUSCULAR | Status: AC | PRN
  Administered 2019-11-10: 0.5 mg via INTRAVENOUS
  Filled 2019-11-10: qty 1

## 2019-11-10 NOTE — ED Notes (Signed)
Patient transported to X-ray 

## 2019-11-10 NOTE — ED Triage Notes (Signed)
Pt c/o chest pain and abdominal painthat started today with vomiting blood; pt has and open wound to her abdomen with clear/green drainage

## 2019-11-10 NOTE — ED Provider Notes (Signed)
[facility] EMERGENCY DEPARTMENT Provider Note   CSN: [id number] Arrival date & time: 11/10/19  1221     History Chief Complaint  Patient presents with  . Chest Pain    Angel Price is a 54 y.o. female with a history as outlined below, most significant for chronic abdominal pain secondary to multiple surgical interventions including a Billroth II procedure and development of chronic fistulous, history of alcohol abuse, last drink yesterday, gastritis, history of CVA 3 years ago with full recovery, lupus, CAD, hypertension and history of pancreatitis presenting with multiple complaints.  1.  Ongoing periumbilical abdominal pain despite full work-up in our ED 3 days ago, now with uncontrolled vomiting with bloody streaks, reporting she has "filled a pot with emesis" in the past 24 hours, not improving despite dosing of Phenergan.  She denies documented fevers but has had some chills and hot flashes.  Denies diarrhea or constipation, no abdominal distention but she has noticed a widening of her periumbilical chronic scarring which is now started to drain purulent discharge.  2.  Woke today with left-sided chest pain which radiates into her left shoulder.  This has been constant.  She denies shortness of breath, pleuritic symptoms.  Pain has been constant.  She has had no medications for this prior to arrival.  3.  Woke today with right sided weakness including her right arm and leg.  She reports going to bed last night without the symptoms.  She does endorse having a headache yesterday which has resolved today.  She states her last stroke involved facial weakness which has completely resolved after physical therapy, this occurred 3 years ago.  HPI     Past Medical History:  Diagnosis Date  . Abscess    soft tissue  . Adrenal mass ([place])   . Alcohol abuse   . Anxiety   . Blood transfusion without reported diagnosis   . Chronic abdominal pain   . Chronic wound infection of abdomen    . Colon polyp    colonoscopy 04/2014  . Depression   . Diverticulosis    colonoscopy 04/2014 moderat pan colonic  . Drug-seeking behavior   . Gastritis    EGD 05/2014  . Gastroparesis Nov 2015  . GERD (gastroesophageal reflux disease)   . Hemorrhoid    internal large  . Hiatal hernia   . History of Billroth II operation   . Hypertension   . Lung nodule    CT 02/2014 needs repeat 1 month  . Lung nodule < 6cm on CT 04/25/2014  . Lupus ([place])   . Malingering   . Nausea and vomiting    chronic, recurrent  . Pancreatitis   . Schatzki's ring    patent per EGD 04/2014  . Sickle cell trait ([place])   . Suicide attempt ([place])   . 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 Coronary artery disease involving native coronary artery of native heart without angina pectoris 06/25/2019  . Shortness of breath 06/15/2019  . Chest pain 06/15/2019  . Acute respiratory failure with hypoxia ([place]) 05/20/2019  . Essential hypertension 05/20/2019  . COVID-19 05/20/2019  . Syncope and collapse 05/20/2019  . Right sided weakness 05/16/2019  . Metabolic acidosis 82/99/3716  . Intractable vomiting with nausea 02/23/2019  . Abnormal CT scan, colon 12/09/2018  . Abnormal computed tomography of cecum and terminal ileum 12/09/2018  . Cocaine abuse ([place])   .  Colitis 11/11/2018  . Hemorrhoidal skin tag   . SBO (small bowel obstruction) ([place])   . Non-intractable vomiting with nausea   . Folate deficiency 05/11/2017  . Abdominal wall fistula   . Cellulitis of abdominal wall 01/02/2017  . Acute left hemiparesis ([place]) 12/05/2016  . Malingering 12/05/2016  . Weakness of left lower extremity 12/02/2016  . CVA (cerebral vascular accident) ([place]) 11/23/2016  . Bright red rectal bleeding 08/22/2016  . Hypotension due to blood loss   . Acute GI bleeding 05/24/2016  . Dieulafoy lesion of duodenum   . History of Billroth II operation   . Gastrointestinal  hemorrhage 05/22/2016  . Absolute anemia   . Acute pancreatitis 10/01/2015  . Abnormal CT scan of lung 10/01/2015  . Alcohol abuse with intoxication ([place])   . Left-sided weakness   . Psychosomatic factor in physical condition   . Upper GI bleed   . Diverticulosis of colon with hemorrhage   . Chronic wound infection of abdomen 12/22/2014  . 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  . HTN (hypertension) 12/22/2014  . Ataxia 11/01/2014  . Hemorrhoids 04/26/2014  . Lung nodule 04/26/2014  . Diverticulosis   . Gastritis   . Hiatal hernia   . Schatzki's ring   . Acute blood loss anemia 04/25/2014  . Sinus tachycardia 04/25/2014  . Hypokalemia 04/25/2014  . Hematemesis with nausea 04/25/2014  . Lung nodule < 6cm on CT 04/25/2014  . Intractable nausea and vomiting 04/24/2014  . Gastroparesis   . Abdominal pain 04/01/2014  . Chronic abdominal pain 01/21/2014  . Gastroenteritis 12/10/2013  . Chronic abdominal wound infection 08/16/2013  . MDD (major depressive disorder), recurrent episode, severe ([place]) 06/27/2013  . Wrist laceration 06/24/2013  . Diarrhea 05/07/2013  . Rectal bleeding 05/07/2013  . Abnormal LFTs 05/07/2013  . Adrenal mass, left ([place]) 05/07/2013  . Abdominal wall abscess at site of surgical wound 04/19/2013  . Abdominal wall abscess 04/18/2013  . Frequent headaches 03/30/2013  . Sleep difficulties 03/30/2013  . Essential hypertension, benign 03/30/2013  . History of cocaine abuse ([place]) 03/16/2013  . History of schizoaffective disorder 03/16/2013  . Bipolar disorder ([place]) 03/16/2013  . Personality disorder ([place]) 03/16/2013  . Tobacco abuse 03/16/2013  . Alcohol abuse 03/16/2013  . Palpitations 03/16/2013  . Poor vision 03/16/2013  . History of gastric bypass 03/16/2013  . Status post hysterectomy with oophorectomy 03/16/2013  . Hypothyroid 03/16/2013  . Lupus ([place]) 03/16/2013    Past Surgical History:  Procedure Laterality Date  . ABDOMINAL HYSTERECTOMY  2013    [place]  . ABDOMINAL HYSTERECTOMY    . ABDOMINAL SURGERY    . ADRENALECTOMY Right   . AGILE CAPSULE N/A 01/05/2015   Procedure: AGILE CAPSULE;  Surgeon: Daneil Dolin, MD;  Location: [facility] END[facility];  Service: Endoscopy;  Laterality: N/A;  0700  . Billroth II procedure      [place], first 2000, 2005/2006.  . bilroth 2    . BIOPSY  05/20/2013   Procedure: BIOPSIES OF ASCENDING AND SIGMOID COLON;  Surgeon: Daneil Dolin, MD;  Location: [facility] ORS;  Service: Endoscopy;;  . BIOPSY  04/26/2014   Procedure: BIOPSIES;  Surgeon: Danie Binder, MD;  Location: [facility] ORS;  Service: Endoscopy;;  . BIOPSY  12/24/2018   Procedure: BIOPSY;  Surgeon: Daneil Dolin, MD;  Location: [facility];  Service: Endoscopy;;  colon  . CHOLECYSTECTOMY    . COLONOSCOPY     in [place]  . COLONOSCOPY WITH PROPOFOL N/A 05/20/2013  Dr.Rourk- inadequate prep, normal appearing rectum, grossly normal colon aside from pancolonic diverticula, normal terminal ileum bx= unremarkable colonic mucosa. Due for early interval 2016.   Marland Kitchen COLONOSCOPY WITH PROPOFOL N/A 04/26/2014   OIN:OMVEHM ileum/one colon polyp removed/moderate pan-colonic diverticulosis/large internal hemorrhoids  . COLONOSCOPY WITH PROPOFOL N/A 12/23/2014   Dr.Rourk- minimal internal hemorrhoids, pancolonic diverticulosis  . COLONOSCOPY WITH PROPOFOL N/A 08/23/2016   Procedure: COLONOSCOPY WITH PROPOFOL;  Surgeon: Danie Binder, MD;  [facility];  Service: Endoscopy;  Laterality: N/A;  . COLONOSCOPY WITH PROPOFOL N/A 12/24/2018   Pancolonic diverticulosis, normal TI, one 5 mm polyp in rectum (tubular adenoma). Somewhat friable hemorrhagic mucosa in area of IC valve/cecum, possibly related to scope trauma s/p biopsy.   . DEBRIDEMENT OF ABDOMINAL WALL ABSCESS N/A 02/08/2013   Procedure: DEBRIDEMENT OF ABDOMINAL WALL ABSCESS;  Surgeon: Jamesetta So, MD;  Location: [facility] ORS;  Service: General;  Laterality: N/A;  . ESOPHAGOGASTRODUODENOSCOPY (EGD) WITH PROPOFOL N/A  05/20/2013   Dr.Rourk- s/p prior gastric surgery with normal esophagus, residual gastric mucosa and patent efferent limb  . ESOPHAGOGASTRODUODENOSCOPY (EGD) WITH PROPOFOL N/A 02/03/2014   Dr. Gala Romney:  s/p hemigastrectomy with retained gastric contents. Residual gastric mucosa and efferent limb appeared normal otherwise. Query gastroparesis.   Marland Kitchen ESOPHAGOGASTRODUODENOSCOPY (EGD) WITH PROPOFOL N/A 04/26/2014   CNO:[id number] ring/small HH/mild non-erosive gasrtitis/normal anastomosis  . ESOPHAGOGASTRODUODENOSCOPY (EGD) WITH PROPOFOL N/A 12/23/2014   Dr.Rourk- s/p prior hemigastrctomy, active oozing from anastomotic suture site, hemostasis achieved  . ESOPHAGOGASTRODUODENOSCOPY (EGD) WITH PROPOFOL N/A 05/23/2016   Dr. 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 ESOPHAGOGASTRODUODENOSCOPY (EGD) WITH PROPOFOL N/A 05/11/2017   Procedure: ESOPHAGOGASTRODUODENOSCOPY (EGD) WITH PROPOFOL;  Surgeon: Daneil Dolin, MD;  [facility];  Service: Endoscopy;  Laterality: N/A;  . HEMORRHOID SURGERY N/A 06/18/2017   Procedure: THREE COLUMN EXTENSIVE HEMORRHOIDECTOMY;  Surgeon: Virl Cagey, MD;  Location: [facility];  Service: General;  Laterality: N/A;  . INCISION AND DRAINAGE ABSCESS N/A 01/06/2017   Procedure: INCISION AND DRAINAGE ABDOMINAL WALL ABSCESS;  Surgeon: Aviva Signs, MD;  Location: [facility];  Service: General;  Laterality: N/A;  . POLYPECTOMY  12/24/2018   Procedure: POLYPECTOMY;  Surgeon: Daneil Dolin, MD;  [facility];  Service: Endoscopy;;  colon  . tendon repar Right    wrist  . WOUND EXPLORATION Right 06/24/2013   Procedure: exploration of traumatic wound right wrist;  Surgeon: Tennis Must, MD;  Location: [facility];  Service: Orthopedics;  Laterality: Right;     OB History    Gravida  4   Para  4   Term  4   Preterm  0   AB  0   Living        SAB  0   TAB  0   Ectopic   0   Multiple      Live Births              Family History  Problem Relation Age of Onset  . Lung cancer Father   . Cancer Father        mets  . Brain cancer Son   . Schizophrenia Son   . Cancer Son        brain  . Drug abuse Mother   . Breast cancer Maternal Aunt   . Bipolar disorder Maternal Aunt   . Drug abuse Maternal Aunt   .  Colon cancer Maternal Grandmother        late 30s, early 41s  . Drug abuse Sister   . Drug abuse Brother   . Bipolar disorder Paternal Grandfather   . Bipolar disorder Cousin   . Liver disease Neg Hx     Social History   Tobacco Use  . Smoking status: Current Every Day Smoker    Packs/day: 0.25    Years: 35.00    Pack years: 8.75    Types: Cigarettes  . Smokeless tobacco: Never Used  Vaping Use  . Vaping Use: Never used  Substance Use Topics  . Alcohol use: Not Currently  . Drug use: Not Currently    Types: Cocaine    Home Medications Prior to Admission medications   Medication Sig Start Date End Date Taking? Authorizing Provider  acetaminophen (TYLENOL) 500 MG tablet Take 2 tablets (1,000 mg total) by mouth 3 (three) times daily as needed for mild pain or moderate pain. 02/26/19  Yes Enzo Bi, MD  amLODipine (NORVASC) 10 MG tablet Take 1 tablet (10 mg total) by mouth daily. 06/16/19  Yes Pokhrel, Laxman, MD  aspirin EC 81 MG EC tablet Take 1 tablet (81 mg total) by mouth daily. 05/19/19  Yes Elgergawy, Silver Huguenin, MD  baclofen (LIORESAL) 10 MG tablet Take 10 mg by mouth 2 (two) times daily. 10/31/19  Yes [provider]  ergocalciferol (VITAMIN D2) 1.25 MG (50000 UT) capsule Take 50,000 Units by mouth every Monday.  10/10/19 11/22/19 Yes [provider]  ferrous gluconate (FERGON) 324 MG tablet Take 324 mg by mouth daily with breakfast.  10/04/19 12/03/19 Yes [provider]  ibuprofen (ADVIL) 800 MG tablet Take 800 mg by mouth in the morning and at bedtime. 10/14/19  Yes [provider]  pantoprazole  (PROTONIX) 20 MG tablet Take 1 tablet (20 mg total) by mouth daily. 11/07/19  Yes Couture, Cortni S, PA-C  rosuvastatin (CRESTOR) 20 MG tablet Take 1 tablet (20 mg total) by mouth daily. 06/25/19 11/06/28 Yes Patwardhan, Manish J, MD  sucralfate (CARAFATE) 1 GM/10ML suspension Take 10 mLs (1 g total) by mouth 4 (four) times daily -  with meals and at bedtime. 11/08/19  Yes Caccavale, Sophia, PA-C  vitamin B-12 (CYANOCOBALAMIN) 1000 MCG tablet Take 2,000 mcg by mouth daily.    Yes [provider]  doxycycline (VIBRAMYCIN) 100 MG capsule Take 1 capsule (100 mg total) by mouth 2 (two) times daily. Patient not taking: Reported on 11/08/2019 06/17/19   Charlann Lange, PA-C  nicotine (NICODERM CQ - DOSED IN MG/24 HOURS) 14 mg/24hr patch Place 1 patch (14 mg total) onto the skin daily. Patient not taking: Reported on 11/08/2019 05/19/19   Elgergawy, Silver Huguenin, MD    Allergies    Metoclopramide, Codeine, Morphine and related, and Reglan [metoclopramide]  Review of Systems   Review of Systems  Constitutional: Negative for fever.  HENT: Negative for congestion and sore throat.   Eyes: Negative.   Respiratory: Negative for cough, chest tightness and shortness of breath.   Cardiovascular: Positive for chest pain. Negative for palpitations and leg swelling.  Gastrointestinal: Positive for abdominal pain, nausea and vomiting.       Hematemesis  Genitourinary: Negative.   Musculoskeletal: Negative for arthralgias, joint swelling and neck pain.  Skin: Negative.  Negative for rash and wound.  Neurological: Positive for weakness. Negative for dizziness, light-headedness, numbness and headaches.  Psychiatric/Behavioral: Negative.     Physical Exam Updated Vital Signs BP 126/87 (BP Location:  Right Arm)   Pulse 95   Temp 99.8 F (37.7 C) (Oral)   Resp 20   Ht 5\' 3"  (1.6 m)   Wt 66.7 kg   LMP  (LMP Unknown)   SpO2 95%   BMI 26.04 kg/m   Physical Exam Vitals and nursing note reviewed.   Constitutional:      Appearance: She is well-developed.  HENT:     Head: Normocephalic and atraumatic.  Eyes:     Conjunctiva/sclera: Conjunctivae normal.  Cardiovascular:     Rate and Rhythm: Regular rhythm. Tachycardia present.     Pulses:          Radial pulses are 2+ on the right side and 2+ on the left side.       Posterior tibial pulses are 2+ on the right side and 2+ on the left side.     Heart sounds: Normal heart sounds.  Pulmonary:     Effort: Pulmonary effort is normal.     Breath sounds: Normal breath sounds. No wheezing, rhonchi or rales.  Chest:     Chest wall: No tenderness.  Abdominal:     General: Bowel sounds are normal.     Palpations: Abdomen is soft.     Tenderness: There is abdominal tenderness.     Comments: Periumbilical pain without guarding.  Significant surgical scarring present with an erythematous edge along a small portion of the scar inferior to the umbilicus. Moist without active drainage.  Musculoskeletal:        General: Normal range of motion.     Cervical back: Normal range of motion.     Right lower leg: No tenderness. No edema.     Left lower leg: No tenderness. No edema.  Skin:    General: Skin is warm and dry.  Neurological:     Mental Status: She is alert.     Cranial Nerves: Cranial nerves are intact.     Sensory: Sensory deficit present.     Motor: Weakness present.     Comments: Pt unable to raise right arm and leg,  Left upper and lower full strength, no drift, normal heel shin left. 3/5 grip right,  5/5 left.      ED Results / Procedures / Treatments   Labs (all labs ordered are listed, but only abnormal results are displayed) Labs Reviewed  COMPREHENSIVE METABOLIC PANEL - Abnormal; Notable for the following components:      Result Value   CO2 19 (*)    All other components within normal limits  CBC WITH DIFFERENTIAL/PLATELET - Abnormal; Notable for the following components:   Hemoglobin 11.4 (*)    HCT 34.0 (*)    MCV  74.4 (*)    MCH 24.9 (*)    RDW 17.8 (*)    Neutro Abs 1.2 (*)    All other components within normal limits  URINALYSIS, ROUTINE W REFLEX MICROSCOPIC - Abnormal; Notable for the following components:   Color, Urine STRAW (*)    All other components within normal limits  ETHANOL - Abnormal; Notable for the following components:   Alcohol, Ethyl (B) 105 (*)    All other components within normal limits  PROTIME-INR - Abnormal; Notable for the following components:   Prothrombin Time 15.4 (*)    INR 1.3 (*)    All other components within normal limits  LIPASE, BLOOD  RAPID URINE DRUG SCREEN, HOSP PERFORMED  APTT  CBG MONITORING, ED  TROPONIN I (HIGH SENSITIVITY)  TROPONIN I (  HIGH SENSITIVITY)    EKG EKG Interpretation  Date/Time:  Wednesday November 10 2019 12:43:09 EDT Ventricular Rate:  111 PR Interval:  132 QRS Duration: 72 QT Interval:  352 QTC Calculation: 478 R Axis:   56 Text Interpretation: Sinus tachycardia Otherwise normal ECG Confirmed by Elnora Morrison [phone]) on 11/10/2019 12:52:25 PM   Radiology MR BRAIN WO CONTRAST  Result Date: 11/10/2019 CLINICAL DATA:  Acute neuro deficit.  Rule out stroke EXAM: MRI HEAD WITHOUT CONTRAST TECHNIQUE: Multiplanar, multiecho pulse sequences of the brain and surrounding structures were obtained without intravenous contrast. COMPARISON:  MRI head 05/09/2019 FINDINGS: Brain: Negative for acute infarct. No significant chronic ischemia. Ventricle size and cerebral volume normal. Negative for hemorrhage or mass. Vascular: Normal arterial flow voids. Skull and upper cervical spine: No focal skeletal lesion. Sinuses/Orbits: Paranasal sinuses clear.  Negative orbit Other: None IMPRESSION: Normal MRI of the brain. Electronically Signed   By: Franchot Gallo M.D.   On: 11/10/2019 15:01   DG ABD ACUTE 2+V W 1V CHEST  Result Date: 11/10/2019 CLINICAL DATA:  Chest and abdominal pain beginning today, hematemesis, open wound in abdomen with cleared  green drainage EXAM: DG ABDOMEN ACUTE W/ 1V CHEST COMPARISON:  05/02/2019 FINDINGS: Normal heart size, mediastinal contours, and pulmonary vascularity. Atherosclerotic calcification aorta. Lungs clear. No infiltrate, pleural effusion or pneumothorax. Surgical clips RIGHT upper quadrant, and stomach, and LEFT mid abdomen. Nonobstructive bowel gas pattern. No bowel dilatation, bowel wall thickening or free air. Scattered stool in colon. No urinary tract calcification. Bones demineralized. IMPRESSION: No acute abnormalities. Electronically Signed   By: Lavonia Dana M.D.   On: 11/10/2019 14:23    Procedures Procedures (including critical care time)  Medications Ordered in ED Medications  sodium chloride 0.9 % bolus 1,000 mL (0 mLs Intravenous Stopped 11/10/19 1509)  promethazine (PHENERGAN) injection 12.5 mg (12.5 mg Intravenous Given 11/10/19 1344)  LORazepam (ATIVAN) injection 0.5 mg (0.5 mg Intravenous Given 11/10/19 1419)  fentaNYL (SUBLIMAZE) injection 100 mcg (100 mcg Intravenous Given 11/10/19 1536)  ondansetron (ZOFRAN) injection 4 mg (4 mg Intravenous Given 11/10/19 1619)    ED Course  I have reviewed the triage vital signs and the nursing notes.  Pertinent labs & imaging results that were available during my care of the patient were reviewed by me and considered in my medical decision making (see chart for details).    MDM Rules/Calculators/A&P                          Lexiscan Tetrofosmin stress test 06/14/2019: No previous exam available for comparison. Lexiscan nuclear stress test performed using 1-day protocol. Stress symptoms included chest pain, dyspnea, dizziness. Stress EKG is non-diagnostic, as this is pharmacological stress test. In additional, stress EKG shows sinus tachycardia at 83% MPHR, no ischemic changes. Myocardial perfusion imaging is normal. Stress LVEF 72% with normal myocardial thickening. Low risk study.     Patient with multiple complaints including chest pain  which she woke with around 8 AM this morning.  She has had delta negative troponins, chest x-ray is clear, EKG is stable.  She initially presented here with tachycardia which has resolved, she has had no shortness of breath and oxygen saturation has been 100% on room air.  She had a negative Lexiscan stress test June 14, 2019.  Symptoms unlikely to be ACS or unstable angina.  Also complains of acute on chronic abdominal pain similar to symptoms for which she was seen with our system  2 days ago.  Her lab tests and exam are stable and reassuring.  Acute abdominal series negative for any obstruction.  She was strongly encouraged to follow-up with her gastroenterologist as was advised at her prior visit.  She has not yet called for this follow-up care.  She endorses hematemesis although has had no vomiting here.  Her hemoglobin today is actually higher than when checked 2 days ago, it is 11.4 today, it was 9.9 on August 23.  She was prescribed Carafate and Protonix 2 days ago, she was encouraged to continue taking these medications and to follow-up with GI.  Also presenting initially with right sided upper and lower extremity weakness.  She had a negative MRI today ruling out the possibility of this being a stroke.  During her ED visit she ambulated to the department without any weakness or need for assistance.  She was advised close follow-up with her PCP for any persistent or worsening symptoms. Final Clinical Impression(s) / ED Diagnoses Final diagnoses:  Chest pain, unspecified type  Chronic abdominal pain  Weakness    Rx / DC Orders ED Discharge Orders    None       Landis Martins 11/10/19 1810    Elnora Morrison, MD 11/12/19 [phone]

## 2019-11-10 NOTE — Discharge Instructions (Addendum)
Your lab tests, exam and imaging including your brain MRI are all stable today.  There is no evidence that your symptoms are related to a stroke or a heart attack.  Your hemoglobin today is better in comparison to 2 days ago, suggesting that you are not having a dangerous loss of blood from your stomach.  Continue taking the Carafate and the Protonix you were prescribed at your visit 2 days ago.  Call your gastroenterologist in [place] for an office appointment.  I also recommend you calling your cardiologist if you continue to have chest pain episodes.

## 2019-11-16 ENCOUNTER — Other Ambulatory Visit: Payer: Self-pay

## 2019-11-16 ENCOUNTER — Encounter (HOSPITAL_BASED_OUTPATIENT_CLINIC_OR_DEPARTMENT_OTHER): Payer: Self-pay | Admitting: *Deleted

## 2019-11-16 ENCOUNTER — Emergency Department (HOSPITAL_BASED_OUTPATIENT_CLINIC_OR_DEPARTMENT_OTHER)
Admission: EM | Admit: 2019-11-16 | Discharge: 2019-11-16 | Disposition: A | Discharge status: Home / Self Care | Payer: Medicare Other | Attending: Emergency Medicine | Admitting: Emergency Medicine

## 2019-11-16 ENCOUNTER — Emergency Department (HOSPITAL_BASED_OUTPATIENT_CLINIC_OR_DEPARTMENT_OTHER): Payer: Medicare Other

## 2019-11-16 DIAGNOSIS — R1084 Generalized abdominal pain: Secondary | ICD-10-CM | POA: Diagnosis not present

## 2019-11-16 DIAGNOSIS — E039 Hypothyroidism, unspecified: Secondary | ICD-10-CM | POA: Diagnosis not present

## 2019-11-16 DIAGNOSIS — Z7982 Long term (current) use of aspirin: Secondary | ICD-10-CM | POA: Insufficient documentation

## 2019-11-16 DIAGNOSIS — I2510 Atherosclerotic heart disease of native coronary artery without angina pectoris: Secondary | ICD-10-CM | POA: Diagnosis not present

## 2019-11-16 DIAGNOSIS — Z8616 Personal history of COVID-19: Secondary | ICD-10-CM | POA: Insufficient documentation

## 2019-11-16 DIAGNOSIS — R112 Nausea with vomiting, unspecified: Secondary | ICD-10-CM | POA: Diagnosis not present

## 2019-11-16 DIAGNOSIS — Z79899 Other long term (current) drug therapy: Secondary | ICD-10-CM | POA: Insufficient documentation

## 2019-11-16 DIAGNOSIS — K219 Gastro-esophageal reflux disease without esophagitis: Secondary | ICD-10-CM | POA: Diagnosis not present

## 2019-11-16 DIAGNOSIS — I10 Essential (primary) hypertension: Secondary | ICD-10-CM | POA: Diagnosis not present

## 2019-11-16 DIAGNOSIS — R1110 Vomiting, unspecified: Secondary | ICD-10-CM | POA: Diagnosis not present

## 2019-11-16 DIAGNOSIS — F17210 Nicotine dependence, cigarettes, uncomplicated: Secondary | ICD-10-CM | POA: Diagnosis not present

## 2019-11-16 DIAGNOSIS — D3500 Benign neoplasm of unspecified adrenal gland: Secondary | ICD-10-CM | POA: Diagnosis not present

## 2019-11-16 DIAGNOSIS — I700 Atherosclerosis of aorta: Secondary | ICD-10-CM | POA: Diagnosis not present

## 2019-11-16 DIAGNOSIS — K8689 Other specified diseases of pancreas: Secondary | ICD-10-CM | POA: Diagnosis not present

## 2019-11-16 LAB — CBC WITH DIFFERENTIAL/PLATELET
Abs Immature Granulocytes: 0.02 10*3/uL (ref 0.00–0.07)
Basophils Absolute: 0 10*3/uL (ref 0.0–0.1)
Basophils Relative: 0 %
Eosinophils Absolute: 0.1 10*3/uL (ref 0.0–0.5)
Eosinophils Relative: 2 %
HCT: 33.2 % — ABNORMAL LOW (ref 36.0–46.0)
Hemoglobin: 10.8 g/dL — ABNORMAL LOW (ref 12.0–15.0)
Immature Granulocytes: 0 %
Lymphocytes Relative: 41 %
Lymphs Abs: 2.6 10*3/uL (ref 0.7–4.0)
MCH: 25.1 pg — ABNORMAL LOW (ref 26.0–34.0)
MCHC: 32.5 g/dL (ref 30.0–36.0)
MCV: 77.2 fL — ABNORMAL LOW (ref 80.0–100.0)
Monocytes Absolute: 0.5 10*3/uL (ref 0.1–1.0)
Monocytes Relative: 8 %
Neutro Abs: 3 10*3/uL (ref 1.7–7.7)
Neutrophils Relative %: 49 %
Platelets: 294 10*3/uL (ref 150–400)
RBC: 4.3 MIL/uL (ref 3.87–5.11)
RDW: 18.9 % — ABNORMAL HIGH (ref 11.5–15.5)
WBC: 6.4 10*3/uL (ref 4.0–10.5)
nRBC: 0 % (ref 0.0–0.2)

## 2019-11-16 LAB — COMPREHENSIVE METABOLIC PANEL
ALT: 21 U/L (ref 0–44)
AST: 30 U/L (ref 15–41)
Albumin: 3.6 g/dL (ref 3.5–5.0)
Alkaline Phosphatase: 65 U/L (ref 38–126)
Anion gap: 9 (ref 5–15)
BUN: 9 mg/dL (ref 6–20)
CO2: 24 mmol/L (ref 22–32)
Calcium: 9 mg/dL (ref 8.9–10.3)
Chloride: 107 mmol/L (ref 98–111)
Creatinine, Ser: 0.59 mg/dL (ref 0.44–1.00)
GFR calc Af Amer: >60 mL/min (ref >60)
GFR calc non Af Amer: >60 mL/min (ref >60)
Glucose, Bld: 97 mg/dL (ref 70–99)
Potassium: 3.9 mmol/L (ref 3.5–5.1)
Sodium: 140 mmol/L (ref 135–145)
Total Bilirubin: 0.4 mg/dL (ref 0.3–1.2)
Total Protein: 7.5 g/dL (ref 6.5–8.1)

## 2019-11-16 LAB — LIPASE, BLOOD: Lipase: 25 U/L (ref 11–51)

## 2019-11-16 MED ORDER — PROMETHAZINE HCL 25 MG/ML IJ SOLN
25.0000 mg | Freq: Once | INTRAMUSCULAR | Status: AC
  Administered 2019-11-16: 25 mg via INTRAMUSCULAR
  Filled 2019-11-16: qty 1

## 2019-11-16 MED ORDER — SODIUM CHLORIDE 0.9 % IV BOLUS
1000.0000 mL | Freq: Once | INTRAVENOUS | Status: AC
  Administered 2019-11-16: 1000 mL via INTRAVENOUS

## 2019-11-16 MED ORDER — HYDROMORPHONE HCL 1 MG/ML IJ SOLN
1.0000 mg | Freq: Once | INTRAMUSCULAR | Status: AC
  Administered 2019-11-16: 1 mg via INTRAVENOUS
  Filled 2019-11-16: qty 1

## 2019-11-16 MED ORDER — ALUM & MAG HYDROXIDE-SIMETH 200-200-20 MG/5ML PO SUSP
30.0000 mL | Freq: Once | ORAL | Status: AC
  Administered 2019-11-16: 30 mL via ORAL
  Filled 2019-11-16: qty 30

## 2019-11-16 MED ORDER — IOHEXOL 300 MG/ML  SOLN
100.0000 mL | Freq: Once | INTRAMUSCULAR | Status: AC | PRN
  Administered 2019-11-16: 100 mL via INTRAVENOUS

## 2019-11-16 MED ORDER — IOHEXOL 300 MG/ML  SOLN: 100.0000 mL | Freq: Once | INTRAMUSCULAR | Status: DC | PRN

## 2019-11-16 NOTE — ED Notes (Signed)
IV attempt x2  Left arm unsuccessful, RN aware

## 2019-11-16 NOTE — ED Triage Notes (Signed)
Pt reports ear pain and bleeding, along with abd pain and vomiting x 4 days, denies any cough or fevers. Last emesis just pta, last bm last night, normal.

## 2019-11-16 NOTE — Discharge Instructions (Signed)
Try pepcid or tagamet up to twice a day.  Try to avoid things that may make this worse, most commonly these are spicy foods tomato based products fatty foods chocolate and peppermint.  Alcohol and tobacco can also make this worse.  Return to the emergency department for sudden worsening pain fever or inability to eat or drink.  

## 2019-11-16 NOTE — ED Notes (Signed)
Attempted IV access x 1; unable to obtain

## 2019-11-16 NOTE — ED Notes (Signed)
Patient transported to CT 

## 2019-11-16 NOTE — ED Provider Notes (Signed)
[facility] EMERGENCY DEPARTMENT Provider Note   CSN: [id number] Arrival date & time: 11/16/19  0940     History Chief Complaint  Patient presents with  . Emesis    Angel Price is a 54 y.o. female.  54 yo F with a chief complaints of diffuse abdominal pain nausea and vomiting.  Going on for about 4 days now.  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  She thinks it feels similar to that.  No fevers or chills.  No cough or congestion.  Diffuse abdominal tenderness.  Has a delayed wound closure from her procedure that she thinks is getting somewhat better.  The history is provided by the patient.  Emesis Associated symptoms: abdominal pain   Associated symptoms: no arthralgias, no chills, no fever, no headaches and no myalgias   Illness Severity:  Moderate Onset quality:  Gradual Duration:  4 days Timing:  Constant Progression:  Worsening Chronicity:  New Associated symptoms: abdominal pain and vomiting   Associated symptoms: no chest pain, no congestion, no fever, no headaches, no myalgias, no nausea, no rhinorrhea, no shortness of breath and no wheezing        Past Medical History:  Diagnosis Date  . Abscess    soft tissue  . Adrenal mass ([place])   . Alcohol abuse   . Anxiety   . Blood transfusion without reported diagnosis   . Chronic abdominal pain   . Chronic wound infection of abdomen   . Colon polyp    colonoscopy 04/2014  . Depression   . Diverticulosis    colonoscopy 04/2014 moderat pan colonic  . Drug-seeking behavior   . Gastritis    EGD 05/2014  . Gastroparesis Nov 2015  . GERD (gastroesophageal reflux disease)   . Hemorrhoid    internal large  . Hiatal hernia   . History of Billroth II operation   . Hypertension   . Lung nodule    CT 02/2014 needs repeat 1 month  . Lung nodule < 6cm on CT 04/25/2014  . Lupus ([place])   . Malingering   . Nausea and vomiting    chronic, recurrent  . Pancreatitis   .  Schatzki's ring    patent per EGD 04/2014  . Sickle cell trait ([place])   . Suicide attempt ([employer])   . 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 Coronary artery disease involving native coronary artery of native heart without angina pectoris 06/25/2019  . Shortness of breath 06/15/2019  . Chest pain 06/15/2019  . Acute respiratory failure with hypoxia ([place]) 05/20/2019  . Essential hypertension 05/20/2019  . COVID-19 05/20/2019  . Syncope and collapse 05/20/2019  . Right sided weakness 05/16/2019  . Metabolic acidosis 19/37/9024  . Intractable vomiting with nausea 02/23/2019  . Abnormal CT scan, colon 12/09/2018  . Abnormal computed tomography of cecum and terminal ileum 12/09/2018  . Cocaine abuse ([place])   . Colitis 11/11/2018  . Hemorrhoidal skin tag   . SBO (small bowel obstruction) ([place])   . Non-intractable vomiting with nausea   . Folate deficiency 05/11/2017  . Abdominal wall fistula   . Cellulitis of abdominal wall 01/02/2017  . Acute left hemiparesis ([place]) 12/05/2016  . Malingering 12/05/2016  . Weakness of left lower extremity 12/02/2016  . CVA (cerebral vascular accident) ([place]) 11/23/2016  . Bright red rectal bleeding 08/22/2016  . Hypotension due to blood  loss   . Acute GI bleeding 05/24/2016  . Dieulafoy lesion of duodenum   . History of Billroth II operation   . Gastrointestinal hemorrhage 05/22/2016  . Absolute anemia   . Acute pancreatitis 10/01/2015  . Abnormal CT scan of lung 10/01/2015  . Alcohol abuse with intoxication ([place])   . Left-sided weakness   . Psychosomatic factor in physical condition   . Upper GI bleed   . Diverticulosis of colon with hemorrhage   . Chronic wound infection of abdomen 12/22/2014  . 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  . HTN (hypertension) 12/22/2014  . Ataxia 11/01/2014  . Hemorrhoids 04/26/2014  . Lung nodule 04/26/2014  . Diverticulosis   . Gastritis   . Hiatal hernia    . Schatzki's ring   . Acute blood loss anemia 04/25/2014  . Sinus tachycardia 04/25/2014  . Hypokalemia 04/25/2014  . Hematemesis with nausea 04/25/2014  . Lung nodule < 6cm on CT 04/25/2014  . Intractable nausea and vomiting 04/24/2014  . Gastroparesis   . Abdominal pain 04/01/2014  . Chronic abdominal pain 01/21/2014  . Gastroenteritis 12/10/2013  . Chronic abdominal wound infection 08/16/2013  . MDD (major depressive disorder), recurrent episode, severe ([place]) 06/27/2013  . Wrist laceration 06/24/2013  . Diarrhea 05/07/2013  . Rectal bleeding 05/07/2013  . Abnormal LFTs 05/07/2013  . Adrenal mass, left ([place]) 05/07/2013  . Abdominal wall abscess at site of surgical wound 04/19/2013  . Abdominal wall abscess 04/18/2013  . Frequent headaches 03/30/2013  . Sleep difficulties 03/30/2013  . Essential hypertension, benign 03/30/2013  . History of cocaine abuse ([place]) 03/16/2013  . History of schizoaffective disorder 03/16/2013  . Bipolar disorder ([place]) 03/16/2013  . Personality disorder ([place]) 03/16/2013  . Tobacco abuse 03/16/2013  . Alcohol abuse 03/16/2013  . Palpitations 03/16/2013  . Poor vision 03/16/2013  . History of gastric bypass 03/16/2013  . Status post hysterectomy with oophorectomy 03/16/2013  . Hypothyroid 03/16/2013  . Lupus ([place]) 03/16/2013    Past Surgical History:  Procedure Laterality Date  . ABDOMINAL HYSTERECTOMY  2013   [place]  . ABDOMINAL HYSTERECTOMY    . ABDOMINAL SURGERY    . ADRENALECTOMY Right   . AGILE CAPSULE N/A 01/05/2015   Procedure: AGILE CAPSULE;  Surgeon: Daneil Dolin, MD;  Location: [facility] END[facility];  Service: Endoscopy;  Laterality: N/A;  0700  . Billroth II procedure      [place], first 2000, 2005/2006.  . bilroth 2    . BIOPSY  05/20/2013   Procedure: BIOPSIES OF ASCENDING AND SIGMOID COLON;  Surgeon: Daneil Dolin, MD;  Location: [facility] ORS;  Service: Endoscopy;;  . BIOPSY  04/26/2014   Procedure: BIOPSIES;  Surgeon: Danie Binder,  MD;  Location: [facility] ORS;  Service: Endoscopy;;  . BIOPSY  12/24/2018   Procedure: BIOPSY;  Surgeon: Daneil Dolin, MD;  Location: [facility];  Service: Endoscopy;;  colon  . CHOLECYSTECTOMY    . COLONOSCOPY     in [place]  . COLONOSCOPY WITH PROPOFOL N/A 05/20/2013   Dr.Rourk- inadequate prep, normal appearing rectum, grossly normal colon aside from pancolonic diverticula, normal terminal ileum bx= unremarkable colonic mucosa. Due for early interval 2016.   Marland Kitchen COLONOSCOPY WITH PROPOFOL N/A 04/26/2014   PYP:PJKDTO ileum/one colon polyp removed/moderate pan-colonic diverticulosis/large internal hemorrhoids  . COLONOSCOPY WITH PROPOFOL N/A 12/23/2014   Dr.Rourk- minimal internal hemorrhoids, pancolonic diverticulosis  . COLONOSCOPY WITH PROPOFOL N/A 08/23/2016   Procedure: COLONOSCOPY WITH PROPOFOL;  Surgeon: Danie Binder, MD;  Location: [facility];  Service: Endoscopy;  Laterality: N/A;  . COLONOSCOPY WITH PROPOFOL N/A 12/24/2018   Pancolonic diverticulosis, normal TI, one 5 mm polyp in rectum (tubular adenoma). Somewhat friable hemorrhagic mucosa in area of IC valve/cecum, possibly related to scope trauma s/p biopsy.   . DEBRIDEMENT OF ABDOMINAL WALL ABSCESS N/A 02/08/2013   Procedure: DEBRIDEMENT OF ABDOMINAL WALL ABSCESS;  Surgeon: Jamesetta So, MD;  Location: [facility] ORS;  Service: General;  Laterality: N/A;  . ESOPHAGOGASTRODUODENOSCOPY (EGD) WITH PROPOFOL N/A 05/20/2013   Dr.Rourk- s/p prior gastric surgery with normal esophagus, residual gastric mucosa and patent efferent limb  . ESOPHAGOGASTRODUODENOSCOPY (EGD) WITH PROPOFOL N/A 02/03/2014   Dr. Gala Romney:  s/p hemigastrectomy with retained gastric contents. Residual gastric mucosa and efferent limb appeared normal otherwise. Query gastroparesis.   Marland Kitchen ESOPHAGOGASTRODUODENOSCOPY (EGD) WITH PROPOFOL N/A 04/26/2014   DPO:EUMPNTIR'W ring/small HH/mild non-erosive gasrtitis/normal anastomosis  . ESOPHAGOGASTRODUODENOSCOPY (EGD) WITH PROPOFOL N/A  12/23/2014   Dr.Rourk- s/p prior hemigastrctomy, active oozing from anastomotic suture site, hemostasis achieved  . ESOPHAGOGASTRODUODENOSCOPY (EGD) WITH PROPOFOL N/A 05/23/2016   Dr. 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 ESOPHAGOGASTRODUODENOSCOPY (EGD) WITH PROPOFOL N/A 05/11/2017   Procedure: ESOPHAGOGASTRODUODENOSCOPY (EGD) WITH PROPOFOL;  Surgeon: Daneil Dolin, MD;  [facility];  Service: Endoscopy;  Laterality: N/A;  . HEMORRHOID SURGERY N/A 06/18/2017   Procedure: THREE COLUMN EXTENSIVE HEMORRHOIDECTOMY;  Surgeon: Virl Cagey, MD;  Location: [facility];  Service: General;  Laterality: N/A;  . INCISION AND DRAINAGE ABSCESS N/A 01/06/2017   Procedure: INCISION AND DRAINAGE ABDOMINAL WALL ABSCESS;  Surgeon: Aviva Signs, MD;  Location: [facility];  Service: General;  Laterality: N/A;  . POLYPECTOMY  12/24/2018   Procedure: POLYPECTOMY;  Surgeon: Daneil Dolin, MD;  Location[facility];  Service: Endoscopy;;  colon  . tendon repar Right    wrist  . WOUND EXPLORATION Right 06/24/2013   Procedure: exploration of traumatic wound right wrist;  Surgeon: Tennis Must, MD;  Location: [facility];  Service: Orthopedics;  Laterality: Right;     OB History    Gravida  4   Para  4   Term  4   Preterm  0   AB  0   Living        SAB  0   TAB  0   Ectopic  0   Multiple      Live Births              Family History  Problem Relation Age of Onset  . Lung cancer Father   . Cancer Father        mets  . Brain cancer Son   . Schizophrenia Son   . Cancer Son        brain  . Drug abuse Mother   . Breast cancer Maternal Aunt   . Bipolar disorder Maternal Aunt   . Drug abuse Maternal Aunt   . Colon cancer Maternal Grandmother        late 5s, early 7s  . Drug abuse Sister   . Drug abuse Brother   . Bipolar disorder Paternal Grandfather   . Bipolar disorder Cousin   .  Liver disease Neg Hx     Social History   Tobacco Use  . Smoking status: Current Every Day Smoker    Packs/day: 0.25    Years: 35.00    Pack years: 8.75  Types: Cigarettes  . Smokeless tobacco: Never Used  Vaping Use  . Vaping Use: Never used  Substance Use Topics  . Alcohol use: Not Currently  . Drug use: Not Currently    Types: Cocaine    Home Medications Prior to Admission medications   Medication Sig Start Date End Date Taking? Authorizing Provider  acetaminophen (TYLENOL) 500 MG tablet Take 2 tablets (1,000 mg total) by mouth 3 (three) times daily as needed for mild pain or moderate pain. 02/26/19   Enzo Bi, MD  amLODipine (NORVASC) 10 MG tablet Take 1 tablet (10 mg total) by mouth daily. 06/16/19   Pokhrel, Corrie Mckusick, MD  aspirin EC 81 MG EC tablet Take 1 tablet (81 mg total) by mouth daily. 05/19/19   Elgergawy, Silver Huguenin, MD  baclofen (LIORESAL) 10 MG tablet Take 10 mg by mouth 2 (two) times daily. 10/31/19   [provider]  doxycycline (VIBRAMYCIN) 100 MG capsule Take 1 capsule (100 mg total) by mouth 2 (two) times daily. Patient not taking: Reported on 11/08/2019 06/17/19   Charlann Lange, PA-C  ergocalciferol (VITAMIN D2) 1.25 MG (50000 UT) capsule Take 50,000 Units by mouth every Monday.  10/10/19 11/22/19  [provider]  ferrous gluconate (FERGON) 324 MG tablet Take 324 mg by mouth daily with breakfast.  10/04/19 12/03/19  [provider]  ibuprofen (ADVIL) 800 MG tablet Take 800 mg by mouth in the morning and at bedtime. 10/14/19   [provider]  nicotine (NICODERM CQ - DOSED IN MG/24 HOURS) 14 mg/24hr patch Place 1 patch (14 mg total) onto the skin daily. Patient not taking: Reported on 11/08/2019 05/19/19   Elgergawy, Silver Huguenin, MD  pantoprazole (PROTONIX) 20 MG tablet Take 1 tablet (20 mg total) by mouth daily. 11/07/19   Couture, Cortni S, PA-C  rosuvastatin (CRESTOR) 20 MG tablet Take 1 tablet (20 mg total) by mouth daily. 06/25/19 11/06/28   Patwardhan, Reynold Bowen, MD  sucralfate (CARAFATE) 1 GM/10ML suspension Take 10 mLs (1 g total) by mouth 4 (four) times daily -  with meals and at bedtime. 11/08/19   Caccavale, Sophia, PA-C  vitamin B-12 (CYANOCOBALAMIN) 1000 MCG tablet Take 2,000 mcg by mouth daily.     [provider]    Allergies    Metoclopramide, Codeine, Morphine and related, and Reglan [metoclopramide]  Review of Systems   Review of Systems  Constitutional: Negative for chills and fever.  HENT: Negative for congestion and rhinorrhea.   Eyes: Negative for redness and visual disturbance.  Respiratory: Negative for shortness of breath and wheezing.   Cardiovascular: Negative for chest pain and palpitations.  Gastrointestinal: Positive for abdominal pain and vomiting. Negative for nausea.  Genitourinary: Negative for dysuria and urgency.  Musculoskeletal: Negative for arthralgias and myalgias.  Skin: Negative for pallor and wound.  Neurological: Negative for dizziness and headaches.    Physical Exam Updated Vital Signs BP (!) 124/93 (BP Location: Left Arm)   Pulse 82   Temp 98.4 F (36.9 C) (Oral)   Resp 14   Ht 5\' 3"  (1.6 m)   Wt 66.7 kg   LMP  (LMP Unknown)   SpO2 99%   BMI 26.04 kg/m   Physical Exam Vitals and nursing note reviewed.  Constitutional:      General: She is not in acute distress.    Appearance: She is well-developed. She is not diaphoretic.  HENT:     Head: Normocephalic and atraumatic.     Comments: Blood in the canal bilaterally,  TM intact, no blood behind tm.  Eyes:     Pupils: Pupils are equal, round, and reactive to light.  Cardiovascular:     Rate and Rhythm: Normal rate and regular rhythm.     Heart sounds: No murmur heard.  No friction rub. No gallop.   Pulmonary:     Effort: Pulmonary effort is normal.     Breath sounds: No wheezing or rales.  Abdominal:     General: There is distension.     Palpations: Abdomen is soft.     Tenderness: There is abdominal  tenderness.     Comments: Diffuse tenderness to the abdomen without focality.  She has some mild distention tympanic to percussion.  She has a chronic appearing abdominal incision that has a approximately dime shaped continued break in the skin.  Musculoskeletal:        General: No tenderness.     Cervical back: Normal range of motion and neck supple.  Skin:    General: Skin is warm and dry.  Neurological:     Mental Status: She is alert and oriented to person, place, and time.  Psychiatric:        Behavior: Behavior normal.     ED Results / Procedures / Treatments   Labs (all labs ordered are listed, but only abnormal results are displayed) Labs Reviewed  CBC WITH DIFFERENTIAL/PLATELET  COMPREHENSIVE METABOLIC PANEL  LIPASE, BLOOD    EKG None  Radiology No results found.  Procedures Procedures (including critical care time) Procedure note: Ultrasound Guided Peripheral IV Ultrasound guided peripheral 1.88 inch angiocath IV placement performed by me. Indications: Nursing unable to place IV. Details: The antecubital fossa and upper arm were evaluated with a multifrequency linear probe. Patent brachial veins were noted. 1 attempt was made to cannulate a vein under realtime US guidance with successful cannulation of the vein and catheter placement. There is return of non-pulsatile dark red blood. The patient tolerated the procedure well without complications. Images archived electronically.  CPT codes: [phone] and [id number]  Medications Ordered in ED Medications  HYDROmorphone (DILAUDID) injection 1 mg (has no administration in time range)  promethazine (PHENERGAN) injection 25 mg (has no administration in time range)  sodium chloride 0.9 % bolus 1,000 mL (has no administration in time range)    ED Course  I have reviewed the triage vital signs and the nursing notes.  Pertinent labs & imaging results that were available during my care of the patient were reviewed by me and  considered in my medical decision making (see chart for details).    MDM Rules/Calculators/A&P                          54 yo F with a chief complaints of diffuse abdominal pain and distention.  Is been going on for about 4 days now.  Patient has had a Roux-en-Y procedure and has had recurrent nausea and vomiting post.  Most recent visit she had an unremarkable CT scan.  Diffusely tender on my exam.  With her prior abdominal surgery and her recurrent vomiting and reported inability to take by mouth will obtain a CT scan.  Lab work.  IV fluids pain meds reassess.  Patient feeling better, no significant lab abnormality.  CT negative.  Tolerating PO.  D/c home.   2:31 PM:  I have discussed the diagnosis/risks/treatment options with the patient and believe the pt to be eligible for discharge home to follow-up with PCP.  We also discussed returning to the ED immediately if new or worsening sx occur. We discussed the sx which are most concerning (e.g., sudden worsening pain, fever, inability to tolerate by mouth) that necessitate immediate return. Medications administered to the patient during their visit and any new prescriptions provided to the patient are listed below.  Medications given during this visit Medications  HYDROmorphone (DILAUDID) injection 1 mg (1 mg Intravenous Given 11/16/19 1228)  promethazine (PHENERGAN) injection 25 mg (25 mg Intramuscular Given 11/16/19 1125)  sodium chloride 0.9 % bolus 1,000 mL ( Intravenous Stopped 11/16/19 1231)  iohexol (OMNIPAQUE) 300 MG/ML solution 100 mL (100 mLs Intravenous Contrast Given 11/16/19 1233)  alum & mag hydroxide-simeth (MAALOX/MYLANTA) 200-200-20 MG/5ML suspension 30 mL (30 mLs Oral Given 11/16/19 1325)     The patient appears reasonably screen and/or stabilized for discharge and I doubt any other medical condition or other [facility] requiring further screening, evaluation, or treatment in the ED at this time prior to discharge.     Final Clinical  Impression(s) / ED Diagnoses Final diagnoses:  None    Rx / DC Orders ED Discharge Orders    None       Deno Etienne, DO 11/16/19 1431

## 2019-11-18 DIAGNOSIS — E039 Hypothyroidism, unspecified: Secondary | ICD-10-CM | POA: Diagnosis not present

## 2019-11-18 DIAGNOSIS — K2100 Gastro-esophageal reflux disease with esophagitis, without bleeding: Secondary | ICD-10-CM | POA: Diagnosis not present

## 2019-12-18 DIAGNOSIS — E039 Hypothyroidism, unspecified: Secondary | ICD-10-CM | POA: Diagnosis not present

## 2019-12-18 DIAGNOSIS — K2100 Gastro-esophageal reflux disease with esophagitis, without bleeding: Secondary | ICD-10-CM | POA: Diagnosis not present

## 2019-12-22 ENCOUNTER — Other Ambulatory Visit (HOSPITAL_COMMUNITY): Payer: Self-pay | Admitting: Internal Medicine

## 2019-12-22 DIAGNOSIS — Z1231 Encounter for screening mammogram for malignant neoplasm of breast: Secondary | ICD-10-CM

## 2019-12-27 ENCOUNTER — Ambulatory Visit: Payer: Medicare Other | Admitting: Cardiology

## 2019-12-27 NOTE — Progress Notes (Signed)
Follow up visit  Subjective:   Angel Price, female    DOB: 1965-08-24, 54 y.o.   MRN: [id number]   HPI   Chief Complaint  Patient presents with  . Loss of Consciousness  . Follow-up    54 y.o. African female with hypertension, tobacco and alcohol dependence, CAD without angina, recurrent episodes of loss of consciousness.  Since her last visit with me, patient has had multiple ED admissions with emesis.  This has been attributed to her history of prior Roux-en-Y gastric bypass surgery.  Patient has recently experienced episodes of palpitations with activity, also associated with retrosternal chest pain, radiating to her left arm.  She is compliant with aspirin and statin.  She does not take a Profen regularly.  She is not on any antianginal therapy at this time.  On a separate note, patient also reports pain in her legs, both in her calves and thighs on walking, improved with rest.  Initial consultation HPI 06/2019: Patient has been hospitalized twice in last 6 weeks with complaints of loss of consciousness and questionable cardiac arrest.  Both times, patient reportedly had episodes of loss of consciousness, and underwent CPR by first responders, but had pulse present when EMS arrived.  At least 1 of these occasions, patient actually remembers chest compressions being performed.  One of these episodes happened while patient was Covid positive, fortunately without any severe illness related to it.  Both times, she has had elevated alcohol level.  She has also had complaints of unilateral weakness with CT head and MRI being unremarkable.  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   Given left main and triple-vessel calcifications noted, she underwent nuclear stress test which did not show any ischemia.  Echocardiogram showed structurally normal heart without any significant abnormalities.  Patient continues to have constant chest pain throughout the day, only  improved when she sleeps at night.  Patient states that she has cut down her alcohol intake to about 1 beer per day.  She smokes 5 cigarettes/day.  In the past, patient used to have a sixpack every day.  She endorses having had too much to drink when she found out that her godmother passed off of it.  This is about the time she was admitted to the hospital and found to have alcohol level elevated.  Current Outpatient Medications on File Prior to Visit  Medication Sig Dispense Refill  . acetaminophen (TYLENOL) 500 MG tablet Take 2 tablets (1,000 mg total) by mouth 3 (three) times daily as needed for mild pain or moderate pain. 30 tablet 0  . amLODipine (NORVASC) 10 MG tablet Take 1 tablet (10 mg total) by mouth daily. 30 tablet 2  . aspirin EC 81 MG EC tablet Take 1 tablet (81 mg total) by mouth daily. 30 tablet 0  . baclofen (LIORESAL) 10 MG tablet Take 10 mg by mouth 2 (two) times daily.    Marland Kitchen doxycycline (VIBRAMYCIN) 100 MG capsule Take 1 capsule (100 mg total) by mouth 2 (two) times daily. (Patient not taking: Reported on 11/08/2019) 20 capsule 0  . ibuprofen (ADVIL) 800 MG tablet Take 800 mg by mouth in the morning and at bedtime.    . nicotine (NICODERM CQ - DOSED IN MG/24 HOURS) 14 mg/24hr patch Place 1 patch (14 mg total) onto the skin daily. (Patient not taking: Reported on 11/08/2019) 28 patch 0  . pantoprazole (PROTONIX) 20 MG tablet Take 1 tablet (20 mg total) by mouth daily. [place]  tablet 0  . rosuvastatin (CRESTOR) 20 MG tablet Take 1 tablet (20 mg total) by mouth daily. 90 tablet 3  . sucralfate (CARAFATE) 1 GM/10ML suspension Take 10 mLs (1 g total) by mouth 4 (four) times daily -  with meals and at bedtime. 420 mL 0  . vitamin B-12 (CYANOCOBALAMIN) 1000 MCG tablet Take 2,000 mcg by mouth daily.      No current facility-administered medications on file prior to visit.    Cardiovascular & other pertient studies:  Real time outpatient cardiac telemetry 06/25/2019: Dominant rhythm:  Sinus. HR 52-149 bpm. Avg HR 94 bpm. Occasional ventricular and supraventricular ectopy seen. No atrial fibrillation/atrial flutter/SVT/VT/high grade AV block, sinus pause >3sec noted. Manually triggered events correlate with sinus rhythm/tachycardia.  EKG 06/25/2019: Sinus tachycardia 120 bpm.  Diffuse ST depression  -Nondiagnostic.   CT Chest 06/15/2019: 1. No CT findings for pulmonary embolism. 2. Stable mild tortuosity and ectasia of the thoracic aorta but no aneurysm or dissection. 3. Stable three-vessel coronary artery calcifications. 4. Stable patchy airspace opacity in the right upper lobe, likely atelectasis or residual inflammation. 5. No mediastinal or hilar mass or adenopathy.  Lexiscan Tetrofosmin stress test 06/14/2019: No previous exam available for comparison. Lexiscan nuclear stress test performed using 1-day protocol. Stress symptoms included chest pain, dyspnea, dizziness. Stress EKG is non-diagnostic, as this is pharmacological stress test. In additional, stress EKG shows sinus tachycardia at 83% MPHR, no ischemic changes. Myocardial perfusion imaging is normal. Stress LVEF 72% with normal myocardial thickening. Low risk study.   Echocardiogram 05/20/2019: 1. Left ventricular ejection fraction, by estimation, is 65 to 70%. The  left ventricle has hyperdynamic function. The left ventricle has no  regional wall motion abnormalities. Left ventricular diastolic parameters  are consistent with Grade I diastolic  dysfunction (impaired relaxation).  2. Right ventricular systolic function is normal. The right ventricular  size is normal.  3. The mitral valve is normal in structure and function. No evidence of  mitral valve regurgitation. No evidence of mitral stenosis.  4. The aortic valve is normal in structure and function. Aortic valve  regurgitation is not visualized. No aortic stenosis is present.  5. The inferior vena cava is normal in size with greater than  50%  respiratory variability, suggesting right atrial pressure of 3 mmHg.   Recent labs: 11/16/2019: Glucose 97, BUN/Cr 9/0.59. EGFR >60. Na/K 140/3.9. Rest of the CMP normal H/H 10.8/33.2. MCV 77. Platelets 294  06/17/2019: Glucose 116, BUN/Cr 11/0.53. EGFR normal. Na/K 141/3.8. Albumin 3.3. Rest of the CMP normal H/H 11.7/34.1. MCV 83. Platelets 302 TG 270  2018: Chol 165, TG 166, HDL 33, LDL 99 TSH 1.5 normal    Review of Systems  Cardiovascular: Positive for claudication and syncope. Negative for chest pain, dyspnea on exertion, leg swelling and palpitations.         Vitals:   12/29/19 0935  BP: (!) 132/97  Pulse: 86  Resp: 16  SpO2: 99%     Body mass index is 26.39 kg/m. Filed Weights   12/29/19 0935  Weight: 149 lb (67.6 kg)     Objective:   Physical Exam Vitals and nursing note reviewed.  Constitutional:      Appearance: She is well-developed.  Neck:     Vascular: No JVD.  Cardiovascular:     Rate and Rhythm: Regular rhythm. Tachycardia present.     Pulses: Intact distal pulses.          Dorsalis pedis pulses are 0 on the  right side and 0 on the left side.       Posterior tibial pulses are 1+ on the right side and 1+ on the left side.     Heart sounds: Normal heart sounds. No murmur heard.   Pulmonary:     Effort: Pulmonary effort is normal.     Breath sounds: Normal breath sounds. No wheezing or rales.           Assessment & Recommendations:   54 y.o. African female with hypertension, tobacco and alcohol dependence, coronary artery disease, now with anginal symptoms and palpitations.    CAD:  Three-vessel coronary atherosclerosis seen on CT chest 06/2019.  No ischemia on stress testing 05/2019. Patient has history of stable angina symptoms. Continue aspirin, statin.  Check lipid panel.   Started metoprolol tartrate 25 mg twice daily, and sublingual nitroglycerin as needed.  If no improvement in symptoms at that time, would recommend  coronary angiography and possible intervention.  Palpitations: PAC, PVC seen on monitor in 06/2019. Metoprolol should help with this as well.  Leg pain: Atypical for claudication.  Will check ABI.  Nigel Mormon, MD [facility] [place] Pager: [phone] Office: [phone]

## 2019-12-29 ENCOUNTER — Ambulatory Visit: Payer: Medicare Other | Admitting: Cardiology

## 2019-12-29 ENCOUNTER — Encounter: Payer: Self-pay | Admitting: Cardiology

## 2019-12-29 ENCOUNTER — Other Ambulatory Visit: Payer: Self-pay

## 2019-12-29 VITALS — BP 132/97 | HR 86 | Resp 16 | Ht 63.0 in | Wt 149.0 lb

## 2019-12-29 DIAGNOSIS — M79605 Pain in left leg: Secondary | ICD-10-CM

## 2019-12-29 DIAGNOSIS — M25561 Pain in right knee: Secondary | ICD-10-CM | POA: Insufficient documentation

## 2019-12-29 DIAGNOSIS — R002 Palpitations: Secondary | ICD-10-CM

## 2019-12-29 DIAGNOSIS — M79604 Pain in right leg: Secondary | ICD-10-CM | POA: Insufficient documentation

## 2019-12-29 DIAGNOSIS — I25118 Atherosclerotic heart disease of native coronary artery with other forms of angina pectoris: Secondary | ICD-10-CM

## 2019-12-29 DIAGNOSIS — I2510 Atherosclerotic heart disease of native coronary artery without angina pectoris: Secondary | ICD-10-CM

## 2019-12-29 MED ORDER — NITROGLYCERIN 0.4 MG SL SUBL: 0.4000 mg | SUBLINGUAL_TABLET | SUBLINGUAL | 3 refills | Status: AC | PRN

## 2019-12-29 MED ORDER — METOPROLOL TARTRATE 25 MG PO TABS: 25.0000 mg | ORAL_TABLET | Freq: Two times a day (BID) | ORAL | 2 refills | Status: DC

## 2020-01-04 ENCOUNTER — Ambulatory Visit: Payer: Medicare Other

## 2020-01-04 ENCOUNTER — Other Ambulatory Visit: Payer: Self-pay

## 2020-01-04 DIAGNOSIS — M79605 Pain in left leg: Secondary | ICD-10-CM | POA: Diagnosis not present

## 2020-01-04 DIAGNOSIS — I25118 Atherosclerotic heart disease of native coronary artery with other forms of angina pectoris: Secondary | ICD-10-CM | POA: Diagnosis not present

## 2020-01-04 DIAGNOSIS — M79604 Pain in right leg: Secondary | ICD-10-CM | POA: Diagnosis not present

## 2020-01-06 ENCOUNTER — Ambulatory Visit (HOSPITAL_COMMUNITY): Payer: Medicare Other

## 2020-01-06 DIAGNOSIS — S31109D Unspecified open wound of abdominal wall, unspecified quadrant without penetration into peritoneal cavity, subsequent encounter: Secondary | ICD-10-CM | POA: Diagnosis not present

## 2020-01-10 ENCOUNTER — Emergency Department (HOSPITAL_COMMUNITY)
Admission: EM | Admit: 2020-01-10 | Discharge: 2020-01-10 | Disposition: A | Discharge status: Home / Self Care | Payer: Medicare Other | Attending: Emergency Medicine | Admitting: Emergency Medicine

## 2020-01-10 ENCOUNTER — Other Ambulatory Visit: Payer: Self-pay

## 2020-01-10 ENCOUNTER — Emergency Department (HOSPITAL_COMMUNITY): Payer: Medicare Other

## 2020-01-10 DIAGNOSIS — F17210 Nicotine dependence, cigarettes, uncomplicated: Secondary | ICD-10-CM | POA: Diagnosis not present

## 2020-01-10 DIAGNOSIS — R112 Nausea with vomiting, unspecified: Secondary | ICD-10-CM | POA: Diagnosis not present

## 2020-01-10 DIAGNOSIS — R109 Unspecified abdominal pain: Secondary | ICD-10-CM | POA: Insufficient documentation

## 2020-01-10 DIAGNOSIS — I10 Essential (primary) hypertension: Secondary | ICD-10-CM | POA: Insufficient documentation

## 2020-01-10 DIAGNOSIS — R079 Chest pain, unspecified: Secondary | ICD-10-CM | POA: Diagnosis not present

## 2020-01-10 LAB — CBC
HCT: 32.7 % — ABNORMAL LOW (ref 36.0–46.0)
Hemoglobin: 10.9 g/dL — ABNORMAL LOW (ref 12.0–15.0)
MCH: 25.2 pg — ABNORMAL LOW (ref 26.0–34.0)
MCHC: 33.3 g/dL (ref 30.0–36.0)
MCV: 75.7 fL — ABNORMAL LOW (ref 80.0–100.0)
Platelets: 400 10*3/uL (ref 150–400)
RBC: 4.32 MIL/uL (ref 3.87–5.11)
RDW: 19.2 % — ABNORMAL HIGH (ref 11.5–15.5)
WBC: 5.9 10*3/uL (ref 4.0–10.5)
nRBC: 0 % (ref 0.0–0.2)

## 2020-01-10 LAB — BASIC METABOLIC PANEL
Anion gap: 13 (ref 5–15)
BUN: 16 mg/dL (ref 6–20)
CO2: 18 mmol/L — ABNORMAL LOW (ref 22–32)
Calcium: 8.9 mg/dL (ref 8.9–10.3)
Chloride: 107 mmol/L (ref 98–111)
Creatinine, Ser: 0.82 mg/dL (ref 0.44–1.00)
GFR, Estimated: >60 mL/min (ref >60)
Glucose, Bld: 89 mg/dL (ref 70–99)
Potassium: 3.7 mmol/L (ref 3.5–5.1)
Sodium: 138 mmol/L (ref 135–145)

## 2020-01-10 LAB — TROPONIN I (HIGH SENSITIVITY)
Troponin I (High Sensitivity): 3 ng/L (ref <18)
Troponin I (High Sensitivity): <2 ng/L (ref <18)

## 2020-01-10 LAB — HEPATIC FUNCTION PANEL
ALT: 19 U/L (ref 0–44)
AST: 23 U/L (ref 15–41)
Albumin: 3.5 g/dL (ref 3.5–5.0)
Alkaline Phosphatase: 64 U/L (ref 38–126)
Bilirubin, Direct: 0.1 mg/dL (ref 0.0–0.2)
Indirect Bilirubin: 0.6 mg/dL (ref 0.3–0.9)
Total Bilirubin: 0.7 mg/dL (ref 0.3–1.2)
Total Protein: 7.2 g/dL (ref 6.5–8.1)

## 2020-01-10 LAB — LIPASE, BLOOD: Lipase: 20 U/L (ref 11–51)

## 2020-01-10 MED ORDER — ONDANSETRON HCL 4 MG/2ML IJ SOLN
4.0000 mg | Freq: Once | INTRAMUSCULAR | Status: AC
  Administered 2020-01-10: 4 mg via INTRAVENOUS
  Filled 2020-01-10: qty 2

## 2020-01-10 MED ORDER — HALOPERIDOL LACTATE 5 MG/ML IJ SOLN
2.0000 mg | Freq: Once | INTRAMUSCULAR | Status: AC
  Administered 2020-01-10: 2 mg via INTRAVENOUS
  Filled 2020-01-10: qty 1

## 2020-01-10 MED ORDER — FAMOTIDINE IN NACL 20-0.9 MG/50ML-% IV SOLN
20.0000 mg | Freq: Once | INTRAVENOUS | Status: AC
  Administered 2020-01-10: 20 mg via INTRAVENOUS
  Filled 2020-01-10: qty 50

## 2020-01-10 NOTE — ED Triage Notes (Signed)
Pt BIBA from home with abdominal pain for a week. Pt vomited while grocery shopping today at 11:30 and noticed blood in the vomit. Pt has a hx of hernia surgery with mesh placement. Upon arriving home from the store, patient began to have left sided chest pain with radiation to the left arm. Pt self administered personal nitro (2 tabs) and EMS gave 324mg  ASA. Pt in NAD. Skin warm and dry. Resp even and unlabored.

## 2020-01-10 NOTE — Discharge Instructions (Addendum)
You were evaluated in the Emergency Department and after careful evaluation, we did not find any emergent condition requiring admission or further testing in the hospital.  Your exam/testing today is overall reassuring.  Testing does not show any signs of heart attack or other emergencies.  Please return to the Emergency Department if you experience any worsening of your condition.   Thank you for allowing [place] to be a part of your care.

## 2020-01-10 NOTE — ED Provider Notes (Signed)
[facility] Emergency Department Provider Note MRN:  [id number]  Arrival date & time: 01/10/20     Chief Complaint   Chest Pain and Abdominal Pain   History of Present Illness   Angel Price is a 54 y.o. year-old female with a history of gastroparesis, lupus presenting to the ED with chief complaint of chest pain and abdominal pain.  Patient explains that she has been having persistent nausea and vomiting for 1 week.  She was at [facility] earlier and began feeling nauseated and then began experiencing left-sided chest pain.  Continues to have pain in the chest.  Described as moderate, sharp.  Denies recent fever or cough, endorsing heaviness in the legs as well as the left arm.  Review of Systems  A complete 10 system review of systems was obtained and all systems are negative except as noted in the HPI and PMH.   Patient's Health History    Past Medical History:  Diagnosis Date  . Abscess    soft tissue  . Adrenal mass ([place])   . Alcohol abuse   . Anxiety   . Blood transfusion without reported diagnosis   . Chronic abdominal pain   . Chronic wound infection of abdomen   . Colon polyp    colonoscopy 04/2014  . Depression   . Diverticulosis    colonoscopy 04/2014 moderat pan colonic  . Drug-seeking behavior   . Gastritis    EGD 05/2014  . Gastroparesis Nov 2015  . GERD (gastroesophageal reflux disease)   . Hemorrhoid    internal large  . Hiatal hernia   . History of Billroth II operation   . Hypertension   . Lung nodule    CT 02/2014 needs repeat 1 month  . Lung nodule < 6cm on CT 04/25/2014  . Lupus ([place])   . Malingering   . Nausea and vomiting    chronic, recurrent  . Pancreatitis   . Schatzki's ring    patent per EGD 04/2014  . Sickle cell trait ([place])   . Suicide attempt ([place])   . 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 ABDOMINAL HYSTERECTOMY  2013   [place]  . ABDOMINAL  HYSTERECTOMY    . ABDOMINAL SURGERY    . ADRENALECTOMY Right   . AGILE CAPSULE N/A 01/05/2015   Procedure: AGILE CAPSULE;  Surgeon: Daneil Dolin, MD;  [facility];  Service: Endoscopy;  Laterality: N/A;  0700  . Billroth II procedure      [place], first 2000, 2005/2006.  . bilroth 2    . BIOPSY  05/20/2013   Procedure: BIOPSIES OF ASCENDING AND SIGMOID COLON;  Surgeon: Daneil Dolin, MD;  Location: [facility] ORS;  Service: Endoscopy;;  . BIOPSY  04/26/2014   Procedure: BIOPSIES;  Surgeon: Danie Binder, MD;  Location: [facility];  Service: Endoscopy;;  . BIOPSY  12/24/2018   Procedure: BIOPSY;  Surgeon: Daneil Dolin, MD;  [facility];  Service: Endoscopy;;  colon  . CHOLECYSTECTOMY    . COLONOSCOPY     in [place]  . COLONOSCOPY WITH PROPOFOL N/A 05/20/2013   Dr.Rourk- inadequate prep, normal appearing rectum, grossly normal colon aside from pancolonic diverticula, normal terminal ileum bx= unremarkable colonic mucosa. Due for early interval 2016.   Marland Kitchen COLONOSCOPY WITH PROPOFOL N/A 04/26/2014   VZC:HYIFOY ileum/one colon polyp removed/moderate pan-colonic diverticulosis/large internal hemorrhoids  . COLONOSCOPY WITH PROPOFOL N/A  12/23/2014   Dr.Rourk- minimal internal hemorrhoids, pancolonic diverticulosis  . COLONOSCOPY WITH PROPOFOL N/A 08/23/2016   Procedure: COLONOSCOPY WITH PROPOFOL;  Surgeon: Danie Binder, MD;  [facility];  Service: Endoscopy;  Laterality: N/A;  . COLONOSCOPY WITH PROPOFOL N/A 12/24/2018   Pancolonic diverticulosis, normal TI, one 5 mm polyp in rectum (tubular adenoma). Somewhat friable hemorrhagic mucosa in area of IC valve/cecum, possibly related to scope trauma s/p biopsy.   . DEBRIDEMENT OF ABDOMINAL WALL ABSCESS N/A 02/08/2013   Procedure: DEBRIDEMENT OF ABDOMINAL WALL ABSCESS;  Surgeon: Jamesetta So, MD;  Location: [facility];  Service: General;  Laterality: N/A;  . ESOPHAGOGASTRODUODENOSCOPY (EGD) WITH PROPOFOL N/A 05/20/2013   Dr.Rourk-  s/p prior gastric surgery with normal esophagus, residual gastric mucosa and patent efferent limb  . ESOPHAGOGASTRODUODENOSCOPY (EGD) WITH PROPOFOL N/A 02/03/2014   Dr. Gala Romney:  s/p hemigastrectomy with retained gastric contents. Residual gastric mucosa and efferent limb appeared normal otherwise. Query gastroparesis.   Marland Kitchen ESOPHAGOGASTRODUODENOSCOPY (EGD) WITH PROPOFOL N/A 04/26/2014   IOM:BTDHRCBU'L ring/small HH/mild non-erosive gasrtitis/normal anastomosis  . ESOPHAGOGASTRODUODENOSCOPY (EGD) WITH PROPOFOL N/A 12/23/2014   Dr.Rourk- s/p prior hemigastrctomy, active oozing from anastomotic suture site, hemostasis achieved  . ESOPHAGOGASTRODUODENOSCOPY (EGD) WITH PROPOFOL N/A 05/23/2016   Dr. 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 ESOPHAGOGASTRODUODENOSCOPY (EGD) WITH PROPOFOL N/A 05/11/2017   Procedure: ESOPHAGOGASTRODUODENOSCOPY (EGD) WITH PROPOFOL;  Surgeon: Daneil Dolin, MD;  [facility];  Service: Endoscopy;  Laterality: N/A;  . EXCISION OF MESH  08/2019  . HEMORRHOID SURGERY N/A 06/18/2017   Procedure: THREE COLUMN EXTENSIVE HEMORRHOIDECTOMY;  Surgeon: Virl Cagey, MD;  Location: [facility];  Service: General;  Laterality: N/A;  . INCISION AND DRAINAGE ABSCESS N/A 01/06/2017   Procedure: INCISION AND DRAINAGE ABDOMINAL WALL ABSCESS;  Surgeon: Aviva Signs, MD;  Location: [facility];  Service: General;  Laterality: N/A;  . POLYPECTOMY  12/24/2018   Procedure: POLYPECTOMY;  Surgeon: Daneil Dolin, MD;  [facility];  Service: Endoscopy;;  colon  . tendon repar Right    wrist  . WOUND EXPLORATION Right 06/24/2013   Procedure: exploration of traumatic wound right wrist;  Surgeon: Tennis Must, MD;  Location: [facility];  Service: Orthopedics;  Laterality: Right;    Family History  Problem Relation Age of Onset  . Lung cancer Father   . Cancer Father        mets  . Brain cancer  Son   . Schizophrenia Son   . Cancer Son        brain  . Drug abuse Mother   . Breast cancer Maternal Aunt   . Bipolar disorder Maternal Aunt   . Drug abuse Maternal Aunt   . Colon cancer Maternal Grandmother        late 41s, early 45s  . Drug abuse Sister   . Drug abuse Brother   . Bipolar disorder Paternal Grandfather   . Bipolar disorder Cousin   . Liver disease Neg Hx     Social History   Socioeconomic History  . Marital status: Divorced    Spouse name: Not on file  . Number of children: 4  . Years of education: Not on file  . Highest education level: Not on file  Occupational History  . Not on file  Tobacco Use  . Smoking status: Current Every Day Smoker    Packs/day: 0.25  Years: 35.00    Pack years: 8.75    Types: Cigarettes  . Smokeless tobacco: Never Used  Vaping Use  . Vaping Use: Never used  Substance and Sexual Activity  . Alcohol use: Not Currently  . Drug use: Not Currently    Types: Cocaine  . Sexual activity: Not on file  Other Topics Concern  . Not on file  Social History Narrative   ** Merged History Encounter **       Social Determinants of Health   Financial Resource Strain: Low Risk   . Difficulty of Paying Living Expenses: Not very hard  Food Insecurity: No Food Insecurity  . Worried About Charity fundraiser in the Last Year: Never true  . Ran Out of Food in the Last Year: Never true  Transportation Needs: No Transportation Needs  . Lack of Transportation (Medical): No  . Lack of Transportation (Non-Medical): No  Physical Activity: Inactive  . Days of Exercise per Week: 0 days  . Minutes of Exercise per Session: 0 min  Stress: Stress Concern Present  . Feeling of Stress : Rather much  Social Connections: Moderately Isolated  . Frequency of Communication with Friends and Family: More than three times a week  . Frequency of Social Gatherings with Friends and Family: More than three times a week  . Attends Religious Services: 1 to  4 times per year  . Active Member of Clubs or Organizations: No  . Attends Archivist Meetings: Never  . Marital Status: Divorced  Human resources officer Violence: Not At Risk  . Fear of Current or Ex-Partner: No  . Emotionally Abused: No  . Physically Abused: No  . Sexually Abused: No     Physical Exam   Vitals:   01/10/20 1600 01/10/20 1716  BP: 102/74   Pulse: 75 80  Resp: (!) 25 20  SpO2: 99% 97%    CONSTITUTIONAL: Chronically ill-appearing, NAD NEURO:  Alert and oriented x 3, no focal deficits EYES:  eyes equal and reactive ENT/NECK:  no LAD, no JVD CARDIO: Regular rate, well-perfused, normal S1 and S2 PULM:  CTAB no wheezing or rhonchi GI/GU:  normal bowel sounds, non-distended, non-tender MSK/SPINE:  No gross deformities, no edema SKIN:  no rash, atraumatic PSYCH:  Appropriate speech and behavior  *Additional and/or pertinent findings included in MDM below  Diagnostic and Interventional Summary    EKG Interpretation  Date/Time:  Monday January 10 2020 14:15:24 EDT Ventricular Rate:  80 PR Interval:    QRS Duration: 81 QT Interval:  394 QTC Calculation: 455 R Axis:   55 Text Interpretation: Sinus rhythm No significant change was found Confirmed by Gerlene Fee [phone]) on 01/10/2020 2:58:41 PM      Labs Reviewed  BASIC METABOLIC PANEL - Abnormal; Notable for the following components:      Result Value   CO2 18 (*)    All other components within normal limits  CBC - Abnormal; Notable for the following components:   Hemoglobin 10.9 (*)    HCT 32.7 (*)    MCV 75.7 (*)    MCH 25.2 (*)    RDW 19.2 (*)    All other components within normal limits  HEPATIC FUNCTION PANEL  LIPASE, BLOOD  TROPONIN I (HIGH SENSITIVITY)  TROPONIN I (HIGH SENSITIVITY)    DG Chest Portable 1 View  Final Result      Medications  famotidine (PEPCID) IVPB 20 mg premix (0 mg Intravenous Stopped 01/10/20 1558)  ondansetron (ZOFRAN) injection  4 mg (4 mg Intravenous Given  01/10/20 1519)  haloperidol lactate (HALDOL) injection 2 mg (2 mg Intravenous Given 01/10/20 1603)     Procedures  /  Critical Care Procedures  ED Course and Medical Decision Making  I have reviewed the triage vital signs, the nursing notes, and pertinent available records from the EMR.  Listed above are laboratory and imaging tests that I personally ordered, reviewed, and interpreted and then considered in my medical decision making (see below for details).  Long history of chronic pain, chronic nausea vomiting presenting with similar symptoms.  Has had multiple extensive work-ups and recent CT imaging which has been reassuring.  She is having chest pain and so considering ACS.  EKG is reassuring, awaiting troponin, laboratory assessment.  Currently with benign abdomen, well-appearing, normal vital signs.  She does endorse weakness or feeling of fatigue or heaviness to her bilateral legs and left arm but per chart review this seems to be a common complaint and she has had MRI imaging of her brain that has been normal.  Multiple notes suggesting psychogenic etiology of this complaint either malingering or conversion disorder.  Currently without indication for CT imaging.     Work-up is reassuring, troponin negative x2, benign abdomen, patient resting comfortably after Haldol.  Suspect presentation related to her known gastroparesis, appropriate for discharge.  Barth Kirks. Sedonia Small, [place] [email or web address]  Final Clinical Impressions(s) / ED Diagnoses     ICD-10-CM   1. Chest pain, unspecified type  R07.9   2. Nausea and vomiting, intractability of vomiting not specified, unspecified vomiting type  R11.2     ED Discharge Orders    None       Discharge Instructions Discussed with and Provided to Patient:     Discharge Instructions     You were evaluated in the Emergency Department and after careful evaluation, we did not find  any emergent condition requiring admission or further testing in the hospital.  Your exam/testing today is overall reassuring.  Testing does not show any signs of heart attack or other emergencies.  Please return to the Emergency Department if you experience any worsening of your condition.   Thank you for allowing [place] to be a part of your care.       Maudie Flakes, MD 01/10/20 [phone]

## 2020-01-11 ENCOUNTER — Telehealth: Payer: Self-pay

## 2020-01-11 NOTE — Telephone Encounter (Signed)
Telephone encounter:  Reason for call: ABI results   Usual provider: MP  Last office visit: 12/29/19  Next office visit: 01/26/20   Last hospitalization: yesterday 01/10/20 chest pain   Current Outpatient Medications on File Prior to Visit  Medication Sig Dispense Refill  . acetaminophen (TYLENOL) 500 MG tablet Take 2 tablets (1,000 mg total) by mouth 3 (three) times daily as needed for mild pain or moderate pain. (Patient not taking: Reported on 01/10/2020) 30 tablet 0  . amLODipine (NORVASC) 10 MG tablet Take 1 tablet (10 mg total) by mouth daily. 30 tablet 2  . aspirin EC 81 MG EC tablet Take 1 tablet (81 mg total) by mouth daily. 30 tablet 0  . baclofen (LIORESAL) 10 MG tablet Take 10 mg by mouth 2 (two) times daily.    Marland Kitchen doxycycline (VIBRAMYCIN) 100 MG capsule Take 1 capsule (100 mg total) by mouth 2 (two) times daily. (Patient not taking: Reported on 01/10/2020) 20 capsule 0  . ibuprofen (ADVIL) 800 MG tablet Take 800 mg by mouth 2 (two) times daily as needed for mild pain.     . metoprolol tartrate (LOPRESSOR) 25 MG tablet Take 1 tablet (25 mg total) by mouth 2 (two) times daily. 60 tablet 2  . nitroGLYCERIN (NITROSTAT) 0.4 MG SL tablet Place 1 tablet (0.4 mg total) under the tongue every 5 (five) minutes as needed for chest pain. 30 tablet 3  . pantoprazole (PROTONIX) 20 MG tablet Take 1 tablet (20 mg total) by mouth daily. 30 tablet 0  . promethazine (PHENERGAN) 12.5 MG tablet Take 12.5 mg by mouth every 6 (six) hours as needed for nausea or vomiting.     . rosuvastatin (CRESTOR) 20 MG tablet Take 1 tablet (20 mg total) by mouth daily. 90 tablet 3  . sucralfate (CARAFATE) 1 GM/10ML suspension Take 10 mLs (1 g total) by mouth 4 (four) times daily -  with meals and at bedtime. (Patient not taking: Reported on 01/10/2020) 420 mL 0   No current facility-administered medications on file prior to visit.

## 2020-01-18 DIAGNOSIS — I10 Essential (primary) hypertension: Secondary | ICD-10-CM | POA: Diagnosis not present

## 2020-01-18 DIAGNOSIS — E039 Hypothyroidism, unspecified: Secondary | ICD-10-CM | POA: Diagnosis not present

## 2020-01-20 ENCOUNTER — Ambulatory Visit (HOSPITAL_COMMUNITY): Payer: Medicare Other

## 2020-01-25 DIAGNOSIS — K439 Ventral hernia without obstruction or gangrene: Secondary | ICD-10-CM | POA: Diagnosis not present

## 2020-01-26 ENCOUNTER — Other Ambulatory Visit: Payer: Self-pay

## 2020-01-26 ENCOUNTER — Encounter: Payer: Self-pay | Admitting: Cardiology

## 2020-01-26 ENCOUNTER — Ambulatory Visit: Payer: Medicare Other | Admitting: Cardiology

## 2020-01-26 VITALS — BP 123/96 | HR 94 | Ht 63.0 in | Wt 151.0 lb

## 2020-01-26 DIAGNOSIS — I25118 Atherosclerotic heart disease of native coronary artery with other forms of angina pectoris: Secondary | ICD-10-CM

## 2020-01-26 DIAGNOSIS — R002 Palpitations: Secondary | ICD-10-CM | POA: Diagnosis not present

## 2020-01-26 NOTE — Progress Notes (Signed)
Follow up visit  Subjective:   Angel Price, female    DOB: 06-Nov-1965, 54 y.o.   MRN: [id number]   HPI   Chief Complaint  Patient presents with   Coronary Artery Disease   Follow-up    54 y.o. African female with hypertension, tobacco and alcohol dependence, coronary artery disease, now with anginal symptoms and palpitations.    Chest pain improved with metoprolol. She uses SL nitroglycerin 1-2 times a week with improvement in chest pain symptoms.   Initial consultation HPI 06/2019: Patient has been hospitalized twice in last 6 weeks with complaints of loss of consciousness and questionable cardiac arrest.  Both times, patient reportedly had episodes of loss of consciousness, and underwent CPR by first responders, but had pulse present when EMS arrived.  At least 1 of these occasions, patient actually remembers chest compressions being performed.  One of these episodes happened while patient was Covid positive, fortunately without any severe illness related to it.  Both times, she has had elevated alcohol level.  She has also had complaints of unilateral weakness with CT head and MRI being unremarkable.  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   Given left main and triple-vessel calcifications noted, she underwent nuclear stress test which did not show any ischemia.  Echocardiogram showed structurally normal heart without any significant abnormalities.  Patient continues to have constant chest pain throughout the day, only improved when she sleeps at night.  Patient states that she has cut down her alcohol intake to about 1 beer per day.  She smokes 5 cigarettes/day.  In the past, patient used to have a sixpack every day.  She endorses having had too much to drink when she found out that her godmother passed off of it.  This is about the time she was admitted to the hospital and found to have alcohol level elevated.  Current Outpatient Medications on File  Prior to Visit  Medication Sig Dispense Refill   acetaminophen (TYLENOL) 500 MG tablet Take 2 tablets (1,000 mg total) by mouth 3 (three) times daily as needed for mild pain or moderate pain. (Patient not taking: Reported on 01/10/2020) 30 tablet 0   amLODipine (NORVASC) 10 MG tablet Take 1 tablet (10 mg total) by mouth daily. 30 tablet 2   aspirin EC 81 MG EC tablet Take 1 tablet (81 mg total) by mouth daily. 30 tablet 0   baclofen (LIORESAL) 10 MG tablet Take 10 mg by mouth 2 (two) times daily.     doxycycline (VIBRAMYCIN) 100 MG capsule Take 1 capsule (100 mg total) by mouth 2 (two) times daily. (Patient not taking: Reported on 01/10/2020) 20 capsule 0   ibuprofen (ADVIL) 800 MG tablet Take 800 mg by mouth 2 (two) times daily as needed for mild pain.      metoprolol tartrate (LOPRESSOR) 25 MG tablet Take 1 tablet (25 mg total) by mouth 2 (two) times daily. 60 tablet 2   nitroGLYCERIN (NITROSTAT) 0.4 MG SL tablet Place 1 tablet (0.4 mg total) under the tongue every 5 (five) minutes as needed for chest pain. 30 tablet 3   pantoprazole (PROTONIX) 20 MG tablet Take 1 tablet (20 mg total) by mouth daily. 30 tablet 0   promethazine (PHENERGAN) 12.5 MG tablet Take 12.5 mg by mouth every 6 (six) hours as needed for nausea or vomiting.      rosuvastatin (CRESTOR) 20 MG tablet Take 1 tablet (20 mg total) by mouth daily. 90 tablet 3  sucralfate (CARAFATE) 1 GM/10ML suspension Take 10 mLs (1 g total) by mouth 4 (four) times daily -  with meals and at bedtime. (Patient not taking: Reported on 01/10/2020) 420 mL 0   No current facility-administered medications on file prior to visit.    Cardiovascular & other pertient studies:  ABI 01/04/2020:  This exam reveals normal perfusion of the right and left lower extremity  (ABI 1.00) with normal triphasic waveform pattern at the ankles.     Real time outpatient cardiac telemetry 06/25/2019: Dominant rhythm: Sinus. HR 52-149 bpm. Avg HR 94  bpm. Occasional ventricular and supraventricular ectopy seen. No atrial fibrillation/atrial flutter/SVT/VT/high grade AV block, sinus pause >3sec noted. Manually triggered events correlate with sinus rhythm/tachycardia.  EKG 06/25/2019: Sinus tachycardia 120 bpm.  Diffuse ST depression  -Nondiagnostic.   CT Chest 06/15/2019: 1. No CT findings for pulmonary embolism. 2. Stable mild tortuosity and ectasia of the thoracic aorta but no aneurysm or dissection. 3. Stable three-vessel coronary artery calcifications. 4. Stable patchy airspace opacity in the right upper lobe, likely atelectasis or residual inflammation. 5. No mediastinal or hilar mass or adenopathy.  Lexiscan Tetrofosmin stress test 06/14/2019: No previous exam available for comparison. Lexiscan nuclear stress test performed using 1-day protocol. Stress symptoms included chest pain, dyspnea, dizziness. Stress EKG is non-diagnostic, as this is pharmacological stress test. In additional, stress EKG shows sinus tachycardia at 83% MPHR, no ischemic changes. Myocardial perfusion imaging is normal. Stress LVEF 72% with normal myocardial thickening. Low risk study.   Echocardiogram 05/20/2019: 1. Left ventricular ejection fraction, by estimation, is 65 to 70%. The  left ventricle has hyperdynamic function. The left ventricle has no  regional wall motion abnormalities. Left ventricular diastolic parameters  are consistent with Grade I diastolic  dysfunction (impaired relaxation).  2. Right ventricular systolic function is normal. The right ventricular  size is normal.  3. The mitral valve is normal in structure and function. No evidence of  mitral valve regurgitation. No evidence of mitral stenosis.  4. The aortic valve is normal in structure and function. Aortic valve  regurgitation is not visualized. No aortic stenosis is present.  5. The inferior vena cava is normal in size with greater than 50%  respiratory variability,  suggesting right atrial pressure of 3 mmHg.   Recent labs: 11/16/2019: Glucose 97, BUN/Cr 9/0.59. EGFR >60. Na/K 140/3.9. Rest of the CMP normal H/H 10.8/33.2. MCV 77. Platelets 294  06/17/2019: Glucose 116, BUN/Cr 11/0.53. EGFR normal. Na/K 141/3.8. Albumin 3.3. Rest of the CMP normal H/H 11.7/34.1. MCV 83. Platelets 302 TG 270  2018: Chol 165, TG 166, HDL 33, LDL 99 TSH 1.5 normal    Review of Systems  Cardiovascular: Positive for claudication and syncope. Negative for chest pain, dyspnea on exertion, leg swelling and palpitations.         Vitals:   01/26/20 1052  BP: (!) 123/96  Pulse: 94  SpO2: 97%     Body mass index is 26.75 kg/m. Filed Weights   01/26/20 1052  Weight: 151 lb (68.5 kg)     Objective:   Physical Exam Vitals and nursing note reviewed.  Constitutional:      Appearance: She is well-developed.  Neck:     Vascular: No JVD.  Cardiovascular:     Rate and Rhythm: Regular rhythm. Tachycardia present.     Pulses: Intact distal pulses.          Dorsalis pedis pulses are 0 on the right side and 0 on the left  side.       Posterior tibial pulses are 1+ on the right side and 1+ on the left side.     Heart sounds: Normal heart sounds. No murmur heard.   Pulmonary:     Effort: Pulmonary effort is normal.     Breath sounds: Normal breath sounds. No wheezing or rales.           Assessment & Recommendations:   53 y.o. African female with hypertension, tobacco and alcohol dependence, coronary artery disease, now with anginal symptoms and palpitations.    CAD:  Three-vessel coronary atherosclerosis seen on CT chest 06/2019.  No ischemia on stress testing 05/2019. Now with stable angina symptoms. Patient has history of stable angina symptoms. Continue aspirin, statin, metoprolol tartrate 25 mg twice daily, and sublingual nitroglycerin as needed.  Hold off coronary angiogram for now, given improvement in symptoms.  Check lipid  panel  Palpitations: PAC, PVC seen on monitor in 06/2019. Metoprolol should help with this as well.  Leg pain: Normal ABI. Pseudoclaudication.    F/u in 3 months  [place], MD [facility] Pager: [phone] Office: [phone]

## 2020-01-31 DIAGNOSIS — K429 Umbilical hernia without obstruction or gangrene: Secondary | ICD-10-CM | POA: Diagnosis not present

## 2020-01-31 DIAGNOSIS — I700 Atherosclerosis of aorta: Secondary | ICD-10-CM | POA: Diagnosis not present

## 2020-01-31 DIAGNOSIS — R110 Nausea: Secondary | ICD-10-CM | POA: Diagnosis not present

## 2020-01-31 DIAGNOSIS — D3500 Benign neoplasm of unspecified adrenal gland: Secondary | ICD-10-CM | POA: Diagnosis not present

## 2020-01-31 DIAGNOSIS — K439 Ventral hernia without obstruction or gangrene: Secondary | ICD-10-CM | POA: Diagnosis not present

## 2020-02-03 ENCOUNTER — Other Ambulatory Visit: Payer: Self-pay | Admitting: *Deleted

## 2020-02-03 DIAGNOSIS — I10 Essential (primary) hypertension: Secondary | ICD-10-CM | POA: Diagnosis not present

## 2020-02-03 DIAGNOSIS — K432 Incisional hernia without obstruction or gangrene: Secondary | ICD-10-CM | POA: Diagnosis not present

## 2020-02-03 DIAGNOSIS — Z720 Tobacco use: Secondary | ICD-10-CM | POA: Diagnosis not present

## 2020-02-03 NOTE — Patient Outreach (Signed)
[place] [facility]) Care Management  02/03/2020  AYAT DRENNING 04/19/1965 [id number]  Initial outreach for referral from primary care. Unsuccessful, unable to leave a message, received recording that the person is not accepting calls at this time. Sent text to introduce myself and scheduled another call for next week.  Eulah Pont. Myrtie Neither, MSN, [facility] Gerontological Nurse Practitioner [facility] Care Management [phone]

## 2020-02-08 DIAGNOSIS — I2510 Atherosclerotic heart disease of native coronary artery without angina pectoris: Secondary | ICD-10-CM | POA: Diagnosis not present

## 2020-02-08 DIAGNOSIS — K219 Gastro-esophageal reflux disease without esophagitis: Secondary | ICD-10-CM | POA: Diagnosis not present

## 2020-02-08 DIAGNOSIS — L930 Discoid lupus erythematosus: Secondary | ICD-10-CM | POA: Diagnosis not present

## 2020-02-08 DIAGNOSIS — Z23 Encounter for immunization: Secondary | ICD-10-CM | POA: Diagnosis not present

## 2020-02-08 DIAGNOSIS — I10 Essential (primary) hypertension: Secondary | ICD-10-CM | POA: Diagnosis not present

## 2020-02-09 ENCOUNTER — Other Ambulatory Visit: Payer: Self-pay | Admitting: *Deleted

## 2020-02-09 NOTE — Patient Outreach (Signed)
[place] [facility]) Care Management  02/09/2020  TRUDY KORY Dec 18, 1965 [id number]   Second Telephone outreach for [facility] services Unsuccessful, left message and requested a return call. Will send unsuccessful outreach letter.  Eulah Pont. Myrtie Neither, MSN, [facility] Gerontological Nurse Practitioner [facility] Care Management [phone]

## 2020-02-14 DIAGNOSIS — Z720 Tobacco use: Secondary | ICD-10-CM | POA: Diagnosis not present

## 2020-02-14 DIAGNOSIS — L98492 Non-pressure chronic ulcer of skin of other sites with fat layer exposed: Secondary | ICD-10-CM | POA: Diagnosis not present

## 2020-02-17 DIAGNOSIS — Z743 Need for continuous supervision: Secondary | ICD-10-CM | POA: Diagnosis not present

## 2020-02-17 DIAGNOSIS — R6889 Other general symptoms and signs: Secondary | ICD-10-CM | POA: Diagnosis not present

## 2020-02-17 DIAGNOSIS — R404 Transient alteration of awareness: Secondary | ICD-10-CM | POA: Diagnosis not present

## 2020-02-17 DIAGNOSIS — I499 Cardiac arrhythmia, unspecified: Secondary | ICD-10-CM | POA: Diagnosis not present

## 2020-02-17 DIAGNOSIS — F17210 Nicotine dependence, cigarettes, uncomplicated: Secondary | ICD-10-CM | POA: Diagnosis not present

## 2020-02-17 DIAGNOSIS — E119 Type 2 diabetes mellitus without complications: Secondary | ICD-10-CM | POA: Diagnosis not present

## 2020-02-17 DIAGNOSIS — R109 Unspecified abdominal pain: Secondary | ICD-10-CM | POA: Diagnosis not present

## 2020-02-17 DIAGNOSIS — E161 Other hypoglycemia: Secondary | ICD-10-CM | POA: Diagnosis not present

## 2020-02-17 DIAGNOSIS — Z885 Allergy status to narcotic agent status: Secondary | ICD-10-CM | POA: Diagnosis not present

## 2020-02-17 DIAGNOSIS — R55 Syncope and collapse: Secondary | ICD-10-CM | POA: Diagnosis not present

## 2020-02-17 DIAGNOSIS — Z888 Allergy status to other drugs, medicaments and biological substances status: Secondary | ICD-10-CM | POA: Diagnosis not present

## 2020-02-23 ENCOUNTER — Other Ambulatory Visit: Payer: Self-pay | Admitting: *Deleted

## 2020-02-23 NOTE — Patient Outreach (Signed)
[place] [facility]) Care Management  02/23/2020  ERIYONNA MATSUSHITA 25-Dec-1965 [id number]   Third outreach for care management services.   Successful call today. Mrs. Summerhill did answer the call today and informed me that she just buried her mother yesterday and she doesn't feel like talking. Verified that she received our information. She says she does not need the service now but will keep the information and my number for future reference.  Eulah Pont. Myrtie Neither, MSN, [facility] Gerontological Nurse Practitioner [facility] Care Management [phone]

## 2020-02-28 DIAGNOSIS — Z720 Tobacco use: Secondary | ICD-10-CM | POA: Diagnosis not present

## 2020-02-28 DIAGNOSIS — K432 Incisional hernia without obstruction or gangrene: Secondary | ICD-10-CM | POA: Diagnosis not present

## 2020-02-28 DIAGNOSIS — L98492 Non-pressure chronic ulcer of skin of other sites with fat layer exposed: Secondary | ICD-10-CM | POA: Diagnosis not present

## 2020-03-09 DIAGNOSIS — E039 Hypothyroidism, unspecified: Secondary | ICD-10-CM | POA: Diagnosis not present

## 2020-03-09 DIAGNOSIS — K219 Gastro-esophageal reflux disease without esophagitis: Secondary | ICD-10-CM | POA: Diagnosis not present

## 2020-03-18 IMAGING — CT CT ABDOMEN AND PELVIS WITH CONTRAST
2 of 5 series · 16 of 46 positions shown, 18 images · IV contrast (omnipaque)
Comparison: 06/02/2018

CLINICAL DATA: Generalized abdominal pain, nausea, vomiting

EXAM:
CT ABDOMEN AND PELVIS WITH CONTRAST
TECHNIQUE: Multidetector CT imaging of the abdomen and pelvis was performed
using the standard protocol following bolus administration of
intravenous contrast.
CONTRAST:  100mL OMNIPAQUE IOHEXOL 300 MG/ML  SOLN

[Series 2: axial st · axial · 0.70mm/px · z∈[+927,+1312]mm · 13 of 89 slices shown, 15 images]
[im 6/89  soft-tissue]
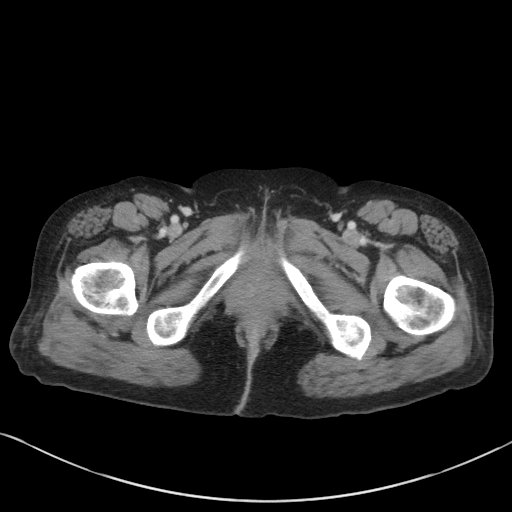
[im 6/89  bone]
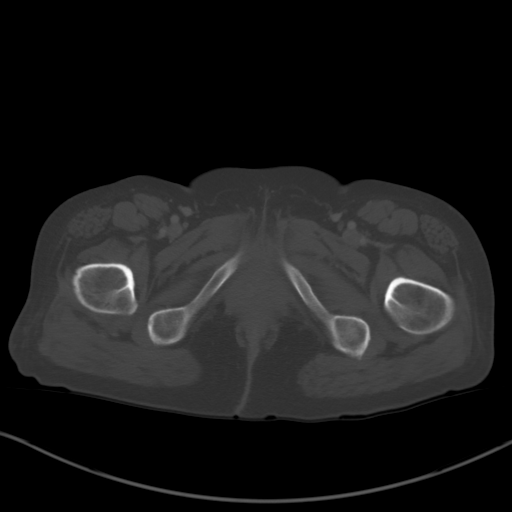
[im 12/89  soft-tissue]
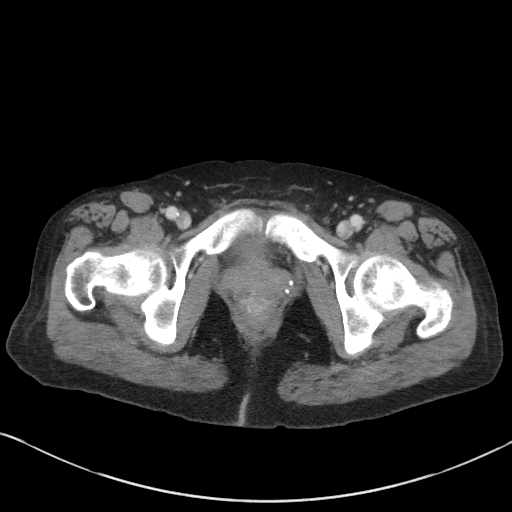
[im 18/89  soft-tissue]
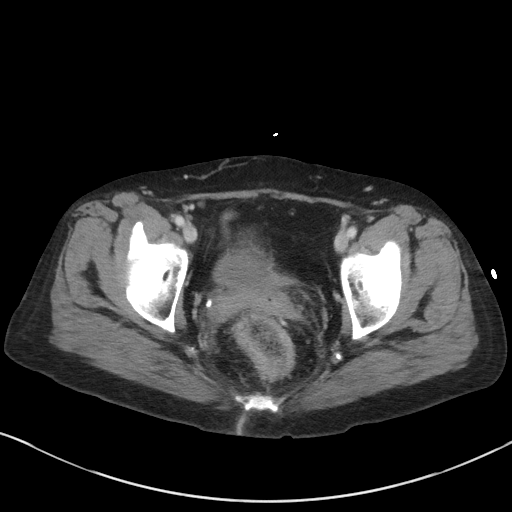
[im 24/89  soft-tissue]
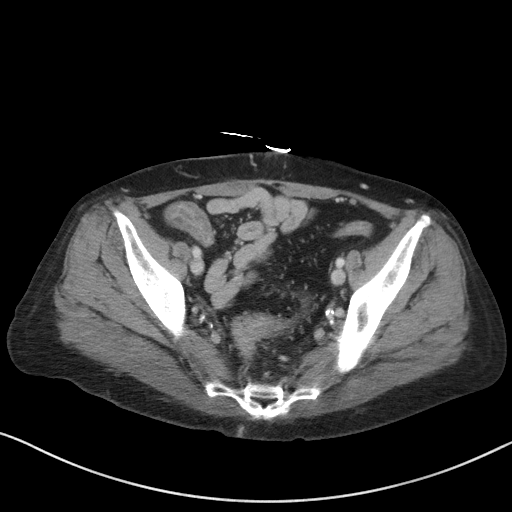
[im 30/89  soft-tissue]
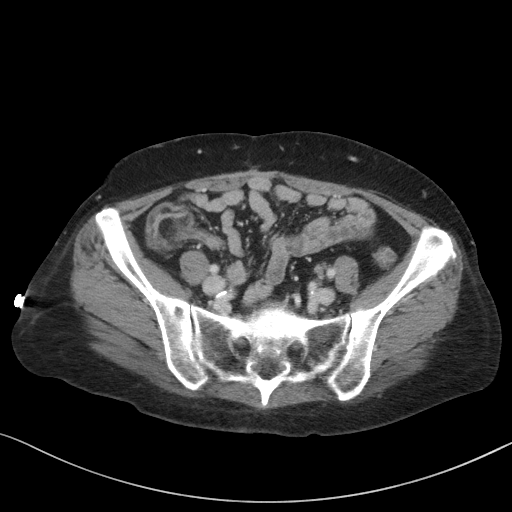
[im 36/89  soft-tissue]
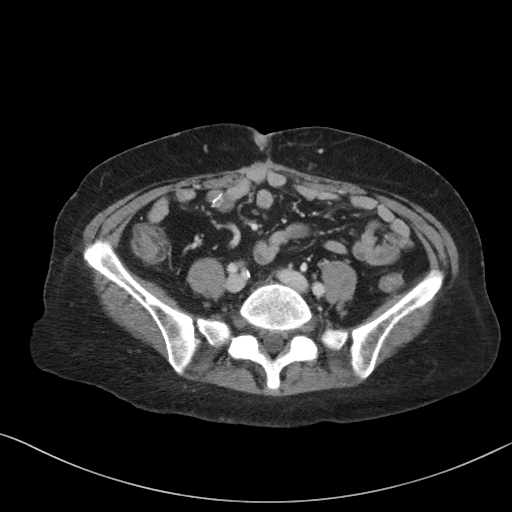
[im 47/89  soft-tissue]
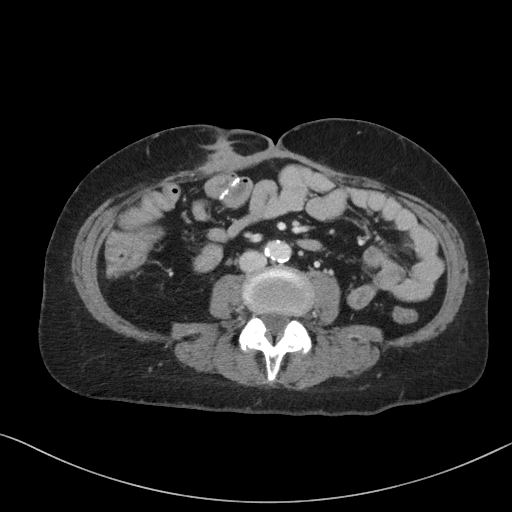
[im 53/89  soft-tissue]
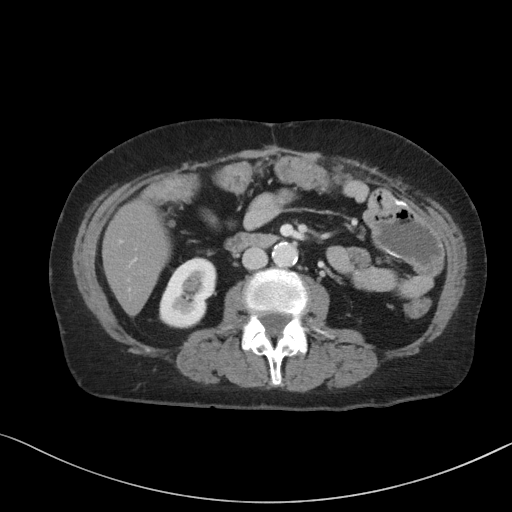
[im 59/89  soft-tissue]
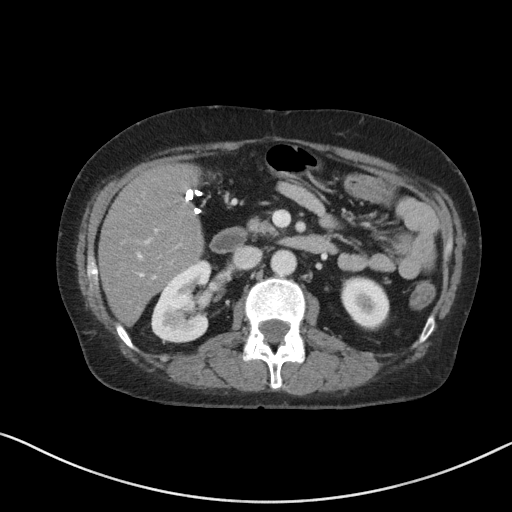
[im 59/89  bone]
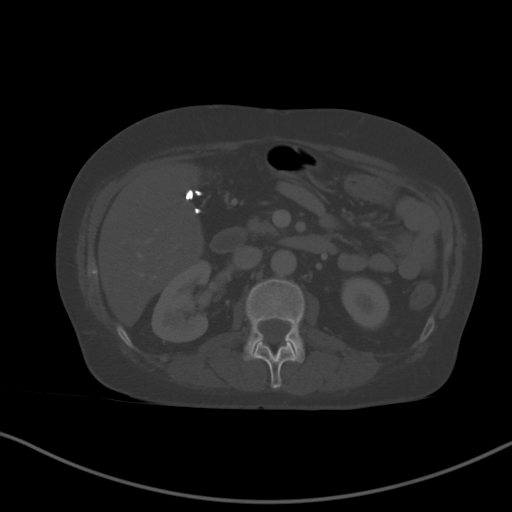
[im 65/89  soft-tissue]
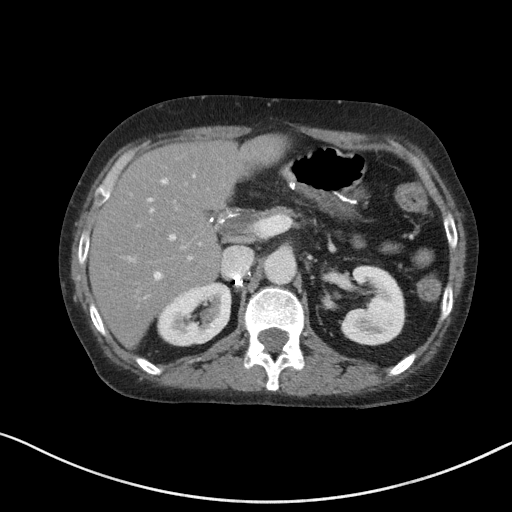
[im 71/89  soft-tissue]
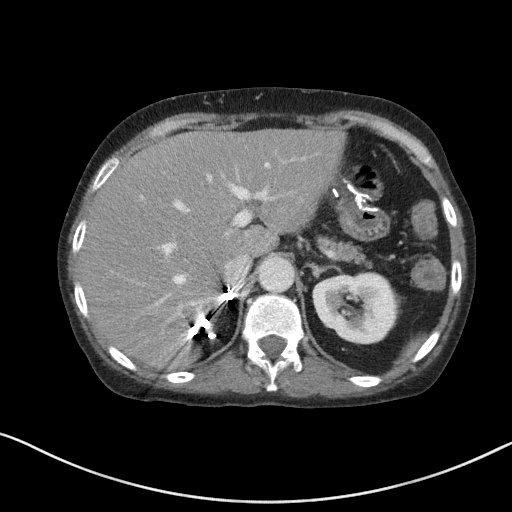
[im 77/89  soft-tissue]
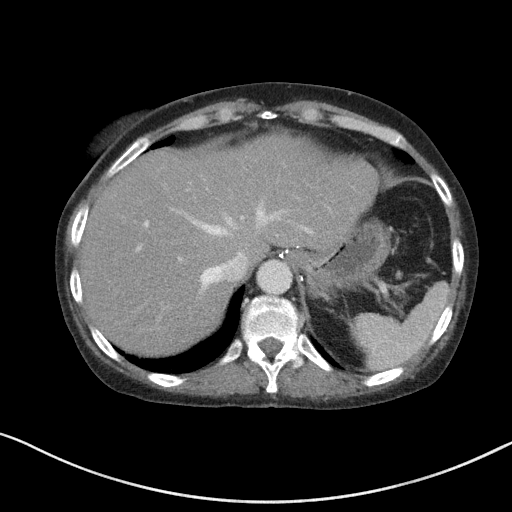
[im 83/89  soft-tissue]
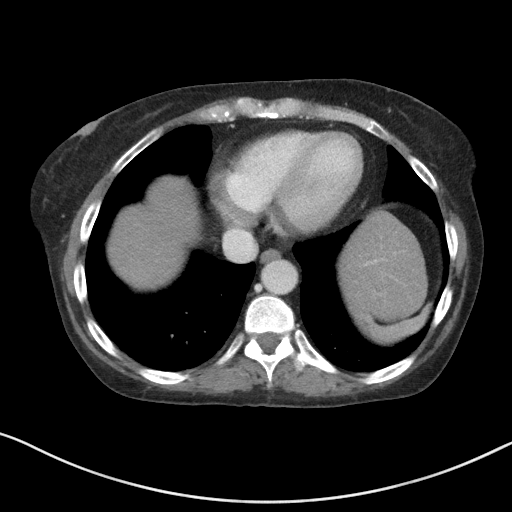

[Series 5: coronal st · coronal · 0.71mm/px · 3 of 89 slices shown]
[im 30/89  soft-tissue]
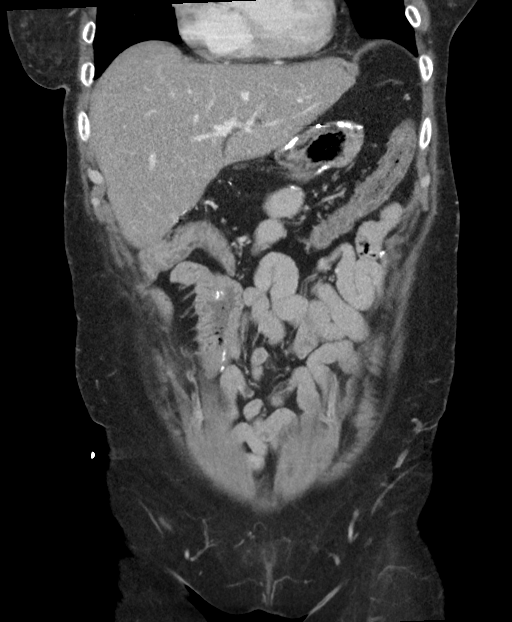
[im 40/89  soft-tissue]
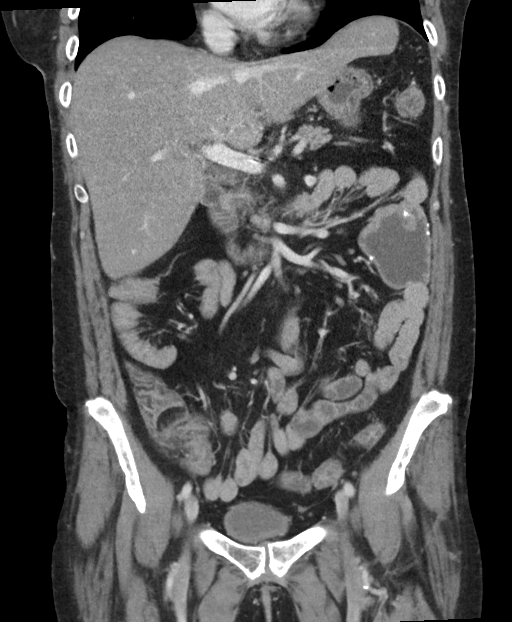
[im 49/89  soft-tissue]
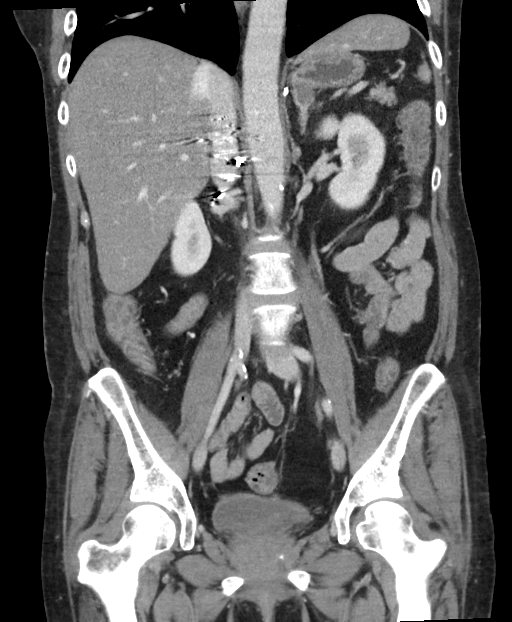

[16 of 46 positions shown; findings below may reference images not displayed]

FINDINGS: Lower chest: No acute abnormality.

Hepatobiliary: Mildly decreased attenuation of the hepatic
parenchyma suggesting fatty infiltration. Lesion. Status post
cholecystectomy. No biliary dilatation.

Pancreas: Unremarkable. No pancreatic ductal dilatation or
surrounding inflammatory changes.

Spleen: Normal in size without focal abnormality.

Adrenals/Urinary Tract: Stable postsurgical changes from prior right
adrenalectomy. Stable 2.1 cm left adrenal adenoma. Kidneys are
unremarkable without hydronephrosis. Urinary bladder is incompletely
distended, limiting its evaluation.

Stomach/Bowel: Mild diffuse colonic wall thickening. No focal
pericolonic inflammatory changes. There is also mild wall thickening
of the terminal ileum. Appendix not clearly visualized. Postsurgical
changes from gastro jejunostomy. No evidence of bowel obstruction.

Vascular/Lymphatic: Scattered aortoiliac atherosclerotic
calcification. No abdominal or pelvic lymphadenopathy.

Reproductive: Status post hysterectomy. No adnexal masses.

Other: Stable appearance of extensive soft tissue attenuation within
the anterior abdominal wall at and near the umbilicus, compatible
with exuberant scarring. No ascites.

Musculoskeletal: No acute or significant osseous findings.
IMPRESSION: 1. Mild diffuse colonic wall thickening suggestive of a non-specific
colitis. Terminal ileum also demonstrates a thickened appearance,
which raises the possibility for inflammatory bowel disease.
2. Chronic postsurgical changes within the abdomen and pelvis, not
significantly changed compared to prior study.

## 2020-03-18 IMAGING — CR PORTABLE CHEST - 1 VIEW
1 series · 1 of 1 positions shown · non-contrast
Comparison: Chest radiographs dated 07/18/2017, 05/14/2017

CLINICAL DATA: Pt c/o abd, n/v/d x 5 days. Hx sickle cell, Lupus,
HTN, GERD, smoker

EXAM:
PORTABLE CHEST 1 VIEW

[ap portable]
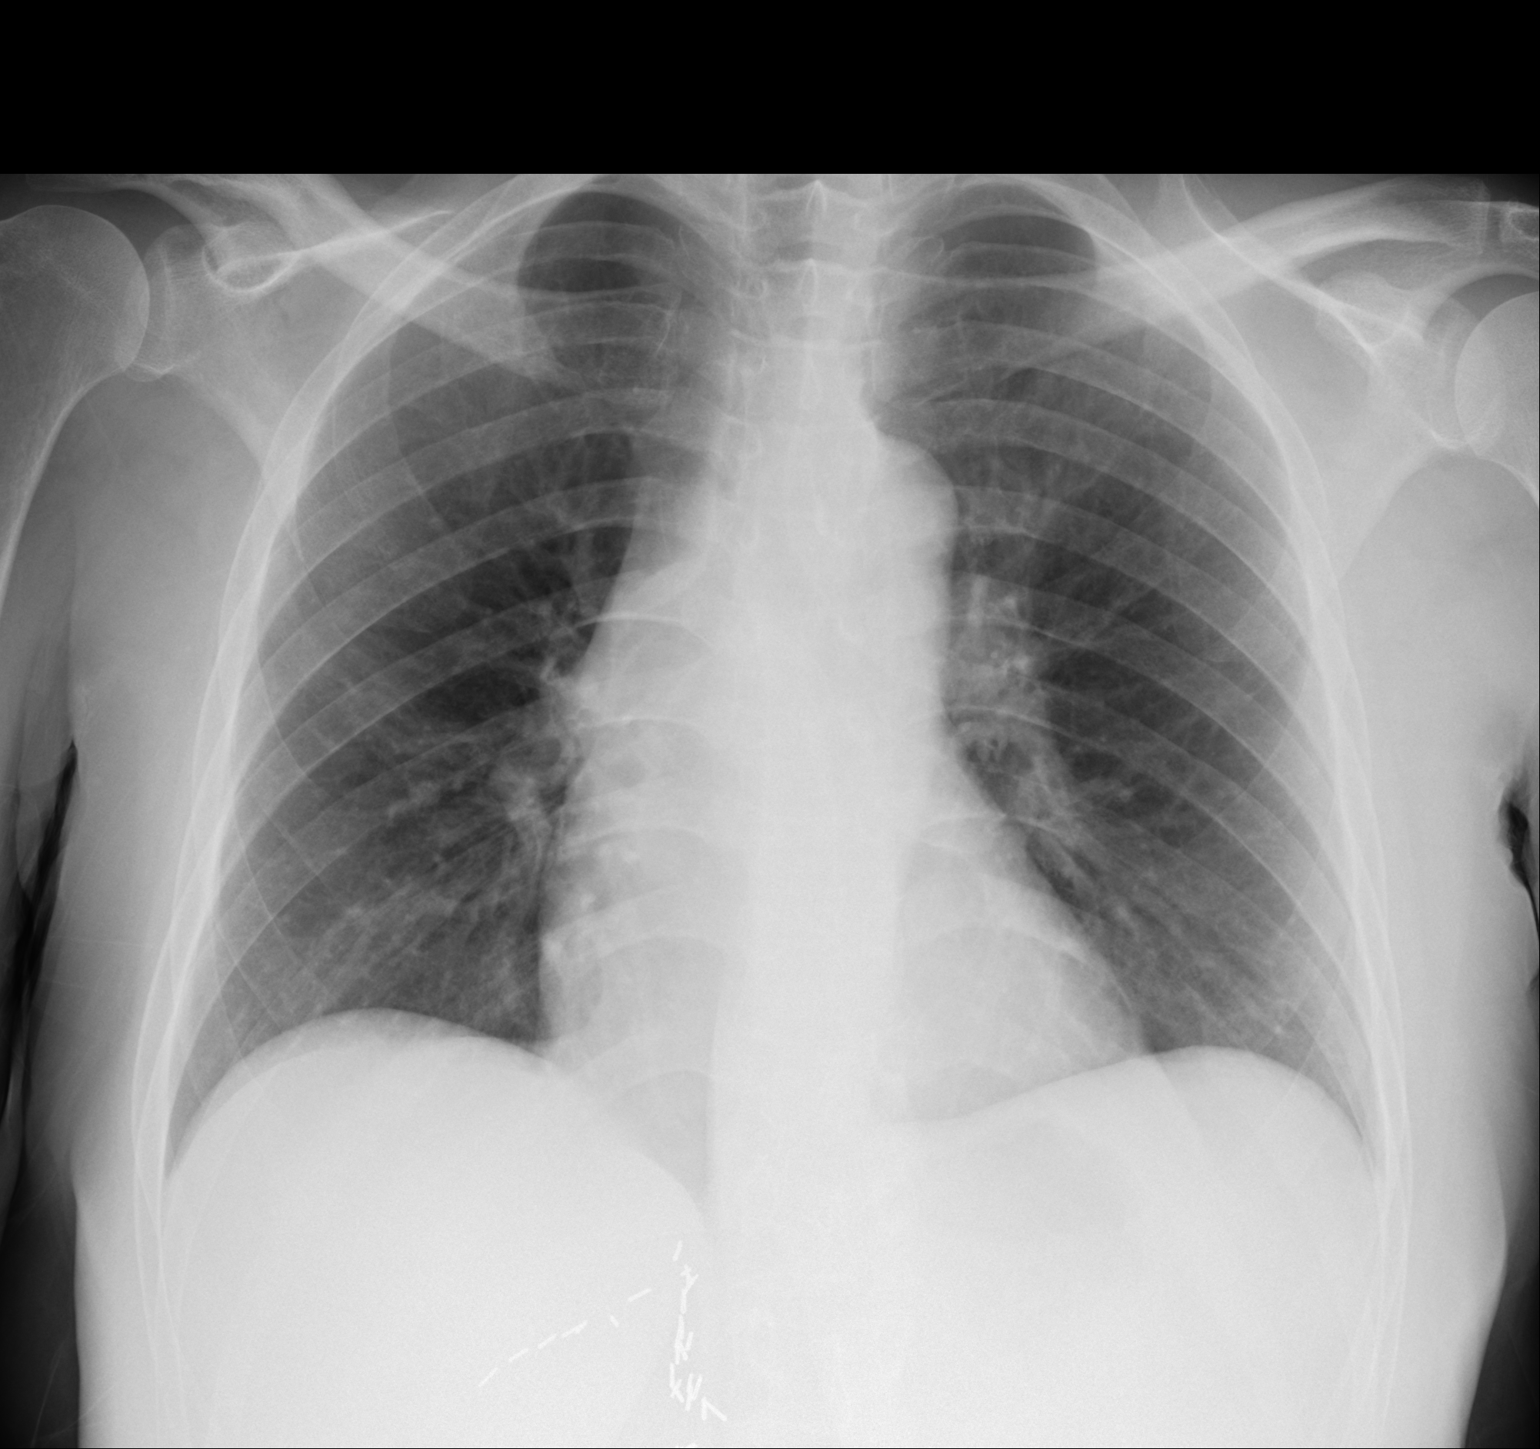

[1 of 1 positions shown; findings below may reference images not displayed]

FINDINGS: Stable cardiomediastinal contours within normal limits for AP
technique. The lungs are clear. No pneumothorax or pleural effusion.
Multiple surgical clips are noted in the right upper quadrant of the
abdomen. No acute finding in the visualized skeleton.
IMPRESSION: No acute cardiopulmonary process.

## 2020-03-25 ENCOUNTER — Other Ambulatory Visit: Payer: Self-pay | Admitting: Cardiology

## 2020-03-25 DIAGNOSIS — R002 Palpitations: Secondary | ICD-10-CM

## 2020-03-25 DIAGNOSIS — I25118 Atherosclerotic heart disease of native coronary artery with other forms of angina pectoris: Secondary | ICD-10-CM

## 2020-03-28 DIAGNOSIS — K432 Incisional hernia without obstruction or gangrene: Secondary | ICD-10-CM | POA: Diagnosis not present

## 2020-04-09 DIAGNOSIS — I10 Essential (primary) hypertension: Secondary | ICD-10-CM | POA: Diagnosis not present

## 2020-04-09 DIAGNOSIS — K219 Gastro-esophageal reflux disease without esophagitis: Secondary | ICD-10-CM | POA: Diagnosis not present

## 2020-04-20 DIAGNOSIS — Z885 Allergy status to narcotic agent status: Secondary | ICD-10-CM | POA: Diagnosis not present

## 2020-04-20 DIAGNOSIS — R269 Unspecified abnormalities of gait and mobility: Secondary | ICD-10-CM | POA: Diagnosis not present

## 2020-04-20 DIAGNOSIS — F32A Depression, unspecified: Secondary | ICD-10-CM | POA: Diagnosis not present

## 2020-04-20 DIAGNOSIS — M329 Systemic lupus erythematosus, unspecified: Secondary | ICD-10-CM | POA: Diagnosis not present

## 2020-04-20 DIAGNOSIS — I2510 Atherosclerotic heart disease of native coronary artery without angina pectoris: Secondary | ICD-10-CM | POA: Diagnosis not present

## 2020-04-20 DIAGNOSIS — Z8711 Personal history of peptic ulcer disease: Secondary | ICD-10-CM | POA: Diagnosis not present

## 2020-04-20 DIAGNOSIS — E785 Hyperlipidemia, unspecified: Secondary | ICD-10-CM | POA: Diagnosis not present

## 2020-04-20 DIAGNOSIS — K432 Incisional hernia without obstruction or gangrene: Secondary | ICD-10-CM | POA: Diagnosis not present

## 2020-04-20 DIAGNOSIS — G319 Degenerative disease of nervous system, unspecified: Secondary | ICD-10-CM | POA: Diagnosis not present

## 2020-04-20 DIAGNOSIS — R079 Chest pain, unspecified: Secondary | ICD-10-CM | POA: Diagnosis not present

## 2020-04-20 DIAGNOSIS — I69354 Hemiplegia and hemiparesis following cerebral infarction affecting left non-dominant side: Secondary | ICD-10-CM | POA: Diagnosis not present

## 2020-04-20 DIAGNOSIS — D539 Nutritional anemia, unspecified: Secondary | ICD-10-CM | POA: Diagnosis not present

## 2020-04-20 DIAGNOSIS — Z79899 Other long term (current) drug therapy: Secondary | ICD-10-CM | POA: Diagnosis not present

## 2020-04-20 DIAGNOSIS — I071 Rheumatic tricuspid insufficiency: Secondary | ICD-10-CM | POA: Diagnosis not present

## 2020-04-20 DIAGNOSIS — I6781 Acute cerebrovascular insufficiency: Secondary | ICD-10-CM | POA: Diagnosis not present

## 2020-04-20 DIAGNOSIS — R55 Syncope and collapse: Secondary | ICD-10-CM | POA: Diagnosis not present

## 2020-04-20 DIAGNOSIS — R110 Nausea: Secondary | ICD-10-CM | POA: Diagnosis not present

## 2020-04-20 DIAGNOSIS — I119 Hypertensive heart disease without heart failure: Secondary | ICD-10-CM | POA: Diagnosis not present

## 2020-04-20 DIAGNOSIS — Z9119 Patient's noncompliance with other medical treatment and regimen: Secondary | ICD-10-CM | POA: Diagnosis not present

## 2020-04-20 DIAGNOSIS — R29898 Other symptoms and signs involving the musculoskeletal system: Secondary | ICD-10-CM | POA: Diagnosis not present

## 2020-04-20 DIAGNOSIS — R0789 Other chest pain: Secondary | ICD-10-CM | POA: Diagnosis not present

## 2020-04-20 DIAGNOSIS — Z888 Allergy status to other drugs, medicaments and biological substances status: Secondary | ICD-10-CM | POA: Diagnosis not present

## 2020-04-20 DIAGNOSIS — R112 Nausea with vomiting, unspecified: Secondary | ICD-10-CM | POA: Diagnosis not present

## 2020-04-21 DIAGNOSIS — I639 Cerebral infarction, unspecified: Secondary | ICD-10-CM | POA: Diagnosis not present

## 2020-04-21 DIAGNOSIS — R079 Chest pain, unspecified: Secondary | ICD-10-CM | POA: Diagnosis not present

## 2020-04-21 DIAGNOSIS — I517 Cardiomegaly: Secondary | ICD-10-CM | POA: Diagnosis not present

## 2020-04-21 DIAGNOSIS — R29898 Other symptoms and signs involving the musculoskeletal system: Secondary | ICD-10-CM | POA: Diagnosis not present

## 2020-04-21 DIAGNOSIS — E785 Hyperlipidemia, unspecified: Secondary | ICD-10-CM | POA: Diagnosis not present

## 2020-04-21 DIAGNOSIS — I63531 Cerebral infarction due to unspecified occlusion or stenosis of right posterior cerebral artery: Secondary | ICD-10-CM | POA: Diagnosis not present

## 2020-04-21 DIAGNOSIS — D539 Nutritional anemia, unspecified: Secondary | ICD-10-CM | POA: Diagnosis not present

## 2020-04-21 DIAGNOSIS — M329 Systemic lupus erythematosus, unspecified: Secondary | ICD-10-CM | POA: Diagnosis not present

## 2020-04-21 DIAGNOSIS — R112 Nausea with vomiting, unspecified: Secondary | ICD-10-CM | POA: Diagnosis not present

## 2020-04-21 DIAGNOSIS — I69354 Hemiplegia and hemiparesis following cerebral infarction affecting left non-dominant side: Secondary | ICD-10-CM | POA: Diagnosis not present

## 2020-04-21 DIAGNOSIS — G459 Transient cerebral ischemic attack, unspecified: Secondary | ICD-10-CM | POA: Diagnosis not present

## 2020-04-21 DIAGNOSIS — K432 Incisional hernia without obstruction or gangrene: Secondary | ICD-10-CM | POA: Diagnosis not present

## 2020-04-21 DIAGNOSIS — I119 Hypertensive heart disease without heart failure: Secondary | ICD-10-CM | POA: Diagnosis not present

## 2020-04-21 DIAGNOSIS — R0789 Other chest pain: Secondary | ICD-10-CM | POA: Diagnosis not present

## 2020-04-27 ENCOUNTER — Ambulatory Visit: Payer: Medicare Other | Admitting: Cardiology

## 2020-04-27 ENCOUNTER — Other Ambulatory Visit: Payer: Self-pay

## 2020-04-27 ENCOUNTER — Encounter: Payer: Self-pay | Admitting: Cardiology

## 2020-04-27 VITALS — BP 164/114 | HR 92 | Temp 98.2°F | Wt 158.0 lb

## 2020-04-27 DIAGNOSIS — E782 Mixed hyperlipidemia: Secondary | ICD-10-CM | POA: Diagnosis not present

## 2020-04-27 DIAGNOSIS — R002 Palpitations: Secondary | ICD-10-CM

## 2020-04-27 DIAGNOSIS — I25110 Atherosclerotic heart disease of native coronary artery with unstable angina pectoris: Secondary | ICD-10-CM | POA: Diagnosis not present

## 2020-04-27 MED ORDER — ATENOLOL 100 MG PO TABS: 50.0000 mg | ORAL_TABLET | Freq: Every day | ORAL | 2 refills | Status: DC

## 2020-04-27 MED ORDER — ISOSORBIDE MONONITRATE ER 30 MG PO TB24: 30.0000 mg | ORAL_TABLET | Freq: Every day | ORAL | 3 refills | Status: DC

## 2020-04-27 NOTE — Progress Notes (Signed)
Follow up visit  Subjective:   Angel Price, female    DOB: [birth year], 55 y.o.   MRN: [id number]    Chief Complaint  Patient presents with  . Coronary Artery Disease  . Follow-up  . Palpitations  . Shortness of Breath  . Chest Pain    55 y.o. African female with hypertension, h/o tobacco and alcohol dependence, coronary artery disease, now with anginal symptoms and palpitations.    Patient has had worsening chest pain and dyspnea on exertion with minimal activity. She also has palpitation symptoms with minimal activity. She is currently taking both atenolol 50 mg and metoprolol tartrate 25 mg bid.   Initial consultation HPI 06/2019: Patient has been hospitalized twice in last 6 weeks with complaints of loss of consciousness and questionable cardiac arrest.  Both times, patient reportedly had episodes of loss of consciousness, and underwent CPR by first responders, but had pulse present when EMS arrived.  At least 1 of these occasions, patient actually remembers chest compressions being performed.  One of these episodes happened while patient was Covid positive, fortunately without any severe illness related to it.  Both times, she has had elevated alcohol level.  She has also had complaints of unilateral weakness with CT head and MRI being unremarkable.  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   Given left main and triple-vessel calcifications noted, she underwent nuclear stress test which did not show any ischemia.  Echocardiogram showed structurally normal heart without any significant abnormalities.  Patient continues to have constant chest pain throughout the day, only improved when she sleeps at night.  Patient states that she has cut down her alcohol intake to about 1 beer per day.  She smokes 5 cigarettes/day.  In the past, patient used to have a sixpack every day.  She endorses having had too much to drink when she found out that her godmother passed  off of it.  This is about the time she was admitted to the hospital and found to have alcohol level elevated.  Current Outpatient Medications on File Prior to Visit  Medication Sig Dispense Refill  . acetaminophen (TYLENOL) 500 MG tablet Take 2 tablets (1,000 mg total) by mouth 3 (three) times daily as needed for mild pain or moderate pain. 30 tablet 0  . amLODipine (NORVASC) 10 MG tablet Take 1 tablet (10 mg total) by mouth daily. 30 tablet 2  . aspirin EC 81 MG EC tablet Take 1 tablet (81 mg total) by mouth daily. 30 tablet 0  . atenolol (TENORMIN) 50 MG tablet Take 50 mg by mouth daily.    . baclofen (LIORESAL) 10 MG tablet Take 10 mg by mouth 2 (two) times daily.    Marland Kitchen escitalopram (LEXAPRO) 20 MG tablet Take 20 mg by mouth daily.    Marland Kitchen ibuprofen (ADVIL) 800 MG tablet Take 800 mg by mouth 2 (two) times daily as needed for mild pain.     . metoprolol tartrate (LOPRESSOR) 25 MG tablet TAKE 1 TABLET(25 MG) BY MOUTH TWICE DAILY 60 tablet 2  . mirtazapine (REMERON) 15 MG tablet Take 15 mg by mouth at bedtime.    . nitroGLYCERIN (NITROSTAT) 0.4 MG SL tablet Place 1 tablet (0.4 mg total) under the tongue every 5 (five) minutes as needed for chest pain. 30 tablet 3  . pantoprazole (PROTONIX) 20 MG tablet Take 1 tablet (20 mg total) by mouth daily. 30 tablet 0  . promethazine (PHENERGAN) 12.5 MG tablet Take 12.5 mg  by mouth every 6 (six) hours as needed for nausea or vomiting.     . rosuvastatin (CRESTOR) 20 MG tablet Take 1 tablet (20 mg total) by mouth daily. 90 tablet 3   No current facility-administered medications on file prior to visit.    Cardiovascular & other pertient studies:  EKG 04/27/2020: Sinus rhythm 88 bpm Normal EKG  ABI 01/04/2020: This exam reveals normal perfusion of the right and left lower extremity  (ABI 1.00) with normal triphasic waveform pattern at the ankles.  Real time outpatient cardiac telemetry 06/25/2019: Dominant rhythm: Sinus. HR 52-149 bpm. Avg HR 94  bpm. Occasional ventricular and supraventricular ectopy seen. No atrial fibrillation/atrial flutter/SVT/VT/high grade AV block, sinus pause >3sec noted. Manually triggered events correlate with sinus rhythm/tachycardia.  CT Chest 06/15/2019: 1. No CT findings for pulmonary embolism. 2. Stable mild tortuosity and ectasia of the thoracic aorta but no aneurysm or dissection. 3. Stable three-vessel coronary artery calcifications. 4. Stable patchy airspace opacity in the right upper lobe, likely atelectasis or residual inflammation. 5. No mediastinal or hilar mass or adenopathy.  Lexiscan Tetrofosmin stress test 06/14/2019: No previous exam available for comparison. Lexiscan nuclear stress test performed using 1-day protocol. Stress symptoms included chest pain, dyspnea, dizziness. Stress EKG is non-diagnostic, as this is pharmacological stress test. In additional, stress EKG shows sinus tachycardia at 83% MPHR, no ischemic changes. Myocardial perfusion imaging is normal. Stress LVEF 72% with normal myocardial thickening. Low risk study.   Echocardiogram 05/20/2019: 1. Left ventricular ejection fraction, by estimation, is 65 to 70%. The  left ventricle has hyperdynamic function. The left ventricle has no  regional wall motion abnormalities. Left ventricular diastolic parameters  are consistent with Grade I diastolic  dysfunction (impaired relaxation).  2. Right ventricular systolic function is normal. The right ventricular  size is normal.  3. The mitral valve is normal in structure and function. No evidence of  mitral valve regurgitation. No evidence of mitral stenosis.  4. The aortic valve is normal in structure and function. Aortic valve  regurgitation is not visualized. No aortic stenosis is present.  5. The inferior vena cava is normal in size with greater than 50%  respiratory variability, suggesting right atrial pressure of 3 mmHg.   Recent labs: 11/16/2019: Glucose 97,  BUN/Cr 9/0.59. EGFR >60. Na/K 140/3.9. Rest of the CMP normal H/H 10.8/33.2. MCV 77. Platelets 294  06/17/2019: Glucose 116, BUN/Cr 11/0.53. EGFR normal. Na/K 141/3.8. Albumin 3.3. Rest of the CMP normal H/H 11.7/34.1. MCV 83. Platelets 302 TG 270  2018: Chol 165, TG 166, HDL 33, LDL 99 TSH 1.5 normal    Review of Systems  Cardiovascular: Positive for chest pain, claudication, palpitations and syncope. Negative for dyspnea on exertion and leg swelling.  Respiratory: Positive for shortness of breath.          Vitals:   04/27/20 1028 04/27/20 1034  BP: (!) 147/106 (!) 158/111  Pulse: 90 88  Temp: 98.2 F (36.8 C)   SpO2: 98% 97%     Body mass index is 27.99 kg/m. Filed Weights   04/27/20 1028  Weight: 158 lb (71.7 kg)     Objective:   Physical Exam Vitals and nursing note reviewed.  Constitutional:      Appearance: She is well-developed.  Neck:     Vascular: No JVD.  Cardiovascular:     Rate and Rhythm: Regular rhythm. Tachycardia present.     Pulses: Intact distal pulses.          Dorsalis  pedis pulses are 0 on the right side and 0 on the left side.       Posterior tibial pulses are 1+ on the right side and 1+ on the left side.     Heart sounds: Normal heart sounds. No murmur heard.   Pulmonary:     Effort: Pulmonary effort is normal.     Breath sounds: Normal breath sounds. No wheezing or rales.           Assessment & Recommendations:   55 y.o. African female with hypertension, tobacco and alcohol dependence, coronary artery disease, now with anginal symptoms and palpitations.    CAD with angina:  Three-vessel coronary atherosclerosis seen on CT chest 06/2019.  No ischemia on stress testing 05/2019, bu now with worsening angina symptoms, now unstable. Increase atenolol to 100 mg daily, stop metoprolol. Start Imdur 30 mg daily. Continue aspirin, statin Continue sublingual nitroglycerin as needed.   Palpitations: PAC, PVC seen on monitor in  06/2019. Continue atenolol. Will monitor Apple watch for any arrhtymias   Nigel Mormon, MD [facility] Pager: [phone] Office: [phone]

## 2020-04-28 DIAGNOSIS — E785 Hyperlipidemia, unspecified: Secondary | ICD-10-CM | POA: Diagnosis not present

## 2020-04-28 DIAGNOSIS — I2510 Atherosclerotic heart disease of native coronary artery without angina pectoris: Secondary | ICD-10-CM | POA: Diagnosis not present

## 2020-04-28 DIAGNOSIS — D509 Iron deficiency anemia, unspecified: Secondary | ICD-10-CM | POA: Diagnosis not present

## 2020-04-28 DIAGNOSIS — I10 Essential (primary) hypertension: Secondary | ICD-10-CM | POA: Diagnosis not present

## 2020-05-05 ENCOUNTER — Other Ambulatory Visit (HOSPITAL_COMMUNITY)
Admission: RE | Admit: 2020-05-05 | Discharge: 2020-05-05 | Disposition: A | Discharge status: Home / Self Care | Payer: Medicare Other | Source: Ambulatory Visit | Attending: Cardiology | Admitting: Cardiology

## 2020-05-05 DIAGNOSIS — I25110 Atherosclerotic heart disease of native coronary artery with unstable angina pectoris: Secondary | ICD-10-CM | POA: Diagnosis not present

## 2020-05-05 DIAGNOSIS — Z01812 Encounter for preprocedural laboratory examination: Secondary | ICD-10-CM | POA: Diagnosis not present

## 2020-05-05 DIAGNOSIS — Z20822 Contact with and (suspected) exposure to covid-19: Secondary | ICD-10-CM | POA: Diagnosis not present

## 2020-05-05 LAB — SARS CORONAVIRUS 2 (TAT 6-24 HRS): SARS Coronavirus 2: NEGATIVE

## 2020-05-06 LAB — BASIC METABOLIC PANEL
BUN/Creatinine Ratio: 17 (ref 9–23)
BUN: 11 mg/dL (ref 6–24)
CO2: 18 mmol/L — ABNORMAL LOW (ref 20–29)
Calcium: 9.6 mg/dL (ref 8.7–10.2)
Chloride: 104 mmol/L (ref 96–106)
Creatinine, Ser: 0.63 mg/dL (ref 0.57–1.00)
GFR calc Af Amer: 119 mL/min/{1.73_m2} (ref >59)
GFR calc non Af Amer: 103 mL/min/{1.73_m2} (ref >59)
Glucose: 97 mg/dL (ref 65–99)
Potassium: 4 mmol/L (ref 3.5–5.2)
Sodium: 141 mmol/L (ref 134–144)

## 2020-05-06 LAB — CBC
Hematocrit: 39.4 % (ref 34.0–46.6)
Hemoglobin: 13.4 g/dL (ref 11.1–15.9)
MCH: 27.1 pg (ref 26.6–33.0)
MCHC: 34 g/dL (ref 31.5–35.7)
MCV: 80 fL (ref 79–97)
Platelets: 415 10*3/uL (ref 150–450)
RBC: 4.94 x10E6/uL (ref 3.77–5.28)
RDW: 18.1 % — ABNORMAL HIGH (ref 11.7–15.4)
WBC: 5.9 10*3/uL (ref 3.4–10.8)

## 2020-05-09 ENCOUNTER — Ambulatory Visit (HOSPITAL_COMMUNITY)
Admission: RE | Admit: 2020-05-09 | Discharge: 2020-05-09 | Disposition: A | Discharge status: Home / Self Care | Payer: Medicare Other | Attending: Cardiology | Admitting: Cardiology

## 2020-05-09 ENCOUNTER — Encounter (HOSPITAL_COMMUNITY)
Admission: RE | Disposition: A | Discharge status: Home / Self Care | Payer: Self-pay | Source: Home / Self Care | Attending: Cardiology

## 2020-05-09 ENCOUNTER — Other Ambulatory Visit: Payer: Self-pay

## 2020-05-09 DIAGNOSIS — R002 Palpitations: Secondary | ICD-10-CM | POA: Insufficient documentation

## 2020-05-09 DIAGNOSIS — R072 Precordial pain: Secondary | ICD-10-CM | POA: Diagnosis present

## 2020-05-09 DIAGNOSIS — I25119 Atherosclerotic heart disease of native coronary artery with unspecified angina pectoris: Secondary | ICD-10-CM | POA: Insufficient documentation

## 2020-05-09 DIAGNOSIS — Z79899 Other long term (current) drug therapy: Secondary | ICD-10-CM | POA: Insufficient documentation

## 2020-05-09 DIAGNOSIS — Z8616 Personal history of COVID-19: Secondary | ICD-10-CM | POA: Diagnosis not present

## 2020-05-09 DIAGNOSIS — I2089 Other forms of angina pectoris: Secondary | ICD-10-CM

## 2020-05-09 DIAGNOSIS — F17210 Nicotine dependence, cigarettes, uncomplicated: Secondary | ICD-10-CM | POA: Insufficient documentation

## 2020-05-09 DIAGNOSIS — I209 Angina pectoris, unspecified: Secondary | ICD-10-CM | POA: Diagnosis not present

## 2020-05-09 DIAGNOSIS — I10 Essential (primary) hypertension: Secondary | ICD-10-CM | POA: Diagnosis not present

## 2020-05-09 DIAGNOSIS — Z7982 Long term (current) use of aspirin: Secondary | ICD-10-CM | POA: Insufficient documentation

## 2020-05-09 DIAGNOSIS — I208 Other forms of angina pectoris: Secondary | ICD-10-CM

## 2020-05-09 HISTORY — PX: LEFT HEART CATH AND CORONARY ANGIOGRAPHY: CATH118249

## 2020-05-09 SURGERY — LEFT HEART CATH AND CORONARY ANGIOGRAPHY
Anesthesia: LOCAL

## 2020-05-09 MED ORDER — LABETALOL HCL 5 MG/ML IV SOLN: 10.0000 mg | INTRAVENOUS | Status: DC | PRN

## 2020-05-09 MED ORDER — SODIUM CHLORIDE 0.9 % WEIGHT BASED INFUSION: 1.0000 mL/kg/h | INTRAVENOUS | Status: DC

## 2020-05-09 MED ORDER — SODIUM CHLORIDE 0.9% FLUSH: 3.0000 mL | INTRAVENOUS | Status: DC | PRN

## 2020-05-09 MED ORDER — ACETAMINOPHEN 325 MG PO TABS: 650.0000 mg | ORAL_TABLET | ORAL | Status: DC | PRN

## 2020-05-09 MED ORDER — HEPARIN SODIUM (PORCINE) 1000 UNIT/ML IJ SOLN
INTRAMUSCULAR | Status: DC | PRN
  Administered 2020-05-09: 4000 [IU] via INTRAVENOUS

## 2020-05-09 MED ORDER — MIDAZOLAM HCL 2 MG/2ML IJ SOLN
INTRAMUSCULAR | Status: AC
  Filled 2020-05-09: qty 2

## 2020-05-09 MED ORDER — SODIUM CHLORIDE 0.9 % IV SOLN
INTRAVENOUS | Status: AC | PRN
  Administered 2020-05-09: 500 mL via INTRAVENOUS

## 2020-05-09 MED ORDER — MIDAZOLAM HCL 2 MG/2ML IJ SOLN
INTRAMUSCULAR | Status: DC | PRN
  Administered 2020-05-09 (×2): 1 mg via INTRAVENOUS

## 2020-05-09 MED ORDER — SODIUM CHLORIDE 0.9% FLUSH: 3.0000 mL | Freq: Two times a day (BID) | INTRAVENOUS | Status: DC

## 2020-05-09 MED ORDER — IOHEXOL 350 MG/ML SOLN
INTRAVENOUS | Status: DC | PRN
  Administered 2020-05-09: 30 mL via INTRA_ARTERIAL

## 2020-05-09 MED ORDER — NITROGLYCERIN 1 MG/10 ML FOR IR/CATH LAB
INTRA_ARTERIAL | Status: AC
  Filled 2020-05-09: qty 10

## 2020-05-09 MED ORDER — SODIUM CHLORIDE 0.9 % WEIGHT BASED INFUSION
3.0000 mL/kg/h | INTRAVENOUS | Status: AC
  Administered 2020-05-09: 3 mL/kg/h via INTRAVENOUS

## 2020-05-09 MED ORDER — LIDOCAINE HCL (PF) 1 % IJ SOLN
INTRAMUSCULAR | Status: DC | PRN
  Administered 2020-05-09: 2 mL via INTRADERMAL

## 2020-05-09 MED ORDER — FENTANYL CITRATE (PF) 100 MCG/2ML IJ SOLN
INTRAMUSCULAR | Status: DC | PRN
  Administered 2020-05-09: 25 ug via INTRAVENOUS
  Administered 2020-05-09: 50 ug via INTRAVENOUS

## 2020-05-09 MED ORDER — HYDRALAZINE HCL 20 MG/ML IJ SOLN
10.0000 mg | INTRAMUSCULAR | Status: DC | PRN
Start: 2020-05-09 — End: 2020-05-09

## 2020-05-09 MED ORDER — HEPARIN (PORCINE) IN NACL 1000-0.9 UT/500ML-% IV SOLN
INTRAVENOUS | Status: AC
  Filled 2020-05-09: qty 1000

## 2020-05-09 MED ORDER — HEPARIN (PORCINE) IN NACL 1000-0.9 UT/500ML-% IV SOLN
INTRAVENOUS | Status: DC | PRN
  Administered 2020-05-09 (×2): 500 mL

## 2020-05-09 MED ORDER — HEPARIN SODIUM (PORCINE) 1000 UNIT/ML IJ SOLN
INTRAMUSCULAR | Status: AC
  Filled 2020-05-09: qty 1

## 2020-05-09 MED ORDER — VERAPAMIL HCL 2.5 MG/ML IV SOLN
INTRAVENOUS | Status: AC
  Filled 2020-05-09: qty 2

## 2020-05-09 MED ORDER — SODIUM CHLORIDE 0.9 % IV SOLN: INTRAVENOUS | Status: AC

## 2020-05-09 MED ORDER — ASPIRIN 81 MG PO CHEW
81.0000 mg | CHEWABLE_TABLET | ORAL | Status: AC
  Administered 2020-05-09: 81 mg via ORAL
  Filled 2020-05-09: qty 1

## 2020-05-09 MED ORDER — SODIUM CHLORIDE 0.9 % IV SOLN: 250.0000 mL | INTRAVENOUS | Status: DC | PRN

## 2020-05-09 MED ORDER — SODIUM CHLORIDE 0.9% FLUSH
3.0000 mL | INTRAVENOUS | Status: DC | PRN
Start: 2020-05-09 — End: 2020-05-09

## 2020-05-09 MED ORDER — FENTANYL CITRATE (PF) 100 MCG/2ML IJ SOLN
INTRAMUSCULAR | Status: AC
  Filled 2020-05-09: qty 2

## 2020-05-09 MED ORDER — NITROGLYCERIN 1 MG/10 ML FOR IR/CATH LAB
INTRA_ARTERIAL | Status: DC | PRN
  Administered 2020-05-09: 100 ug via INTRACORONARY

## 2020-05-09 MED ORDER — ASPIRIN 81 MG PO CHEW: 81.0000 mg | CHEWABLE_TABLET | ORAL | Status: DC

## 2020-05-09 MED ORDER — VERAPAMIL HCL 2.5 MG/ML IV SOLN
INTRAVENOUS | Status: DC | PRN
  Administered 2020-05-09: 10 mL via INTRA_ARTERIAL

## 2020-05-09 MED ORDER — PROMETHAZINE HCL 12.5 MG PO TABS
12.5000 mg | ORAL_TABLET | Freq: Once | ORAL | Status: AC
  Administered 2020-05-09: 12.5 mg via ORAL
  Filled 2020-05-09: qty 1

## 2020-05-09 MED ORDER — ONDANSETRON HCL 4 MG/2ML IJ SOLN: 4.0000 mg | Freq: Four times a day (QID) | INTRAMUSCULAR | Status: DC | PRN

## 2020-05-09 MED ORDER — LIDOCAINE HCL (PF) 1 % IJ SOLN
INTRAMUSCULAR | Status: AC
  Filled 2020-05-09: qty 30

## 2020-05-09 SURGICAL SUPPLY — 11 items
BAG SNAP BAND KOVER 36X36 (MISCELLANEOUS) ×1 IMPLANT
CATH OPTITORQUE TIG 4.0 5F (CATHETERS) ×1 IMPLANT
COVER DOME SNAP 22 D (MISCELLANEOUS) ×1 IMPLANT
DEVICE RAD TR BAND REGULAR (VASCULAR PRODUCTS) ×1 IMPLANT
GLIDESHEATH SLEND A-KIT 6F 22G (SHEATH) ×1 IMPLANT
GUIDEWIRE INQWIRE 1.5J.035X260 (WIRE) IMPLANT
INQWIRE 1.5J .035X260CM (WIRE) ×2
KIT HEART LEFT (KITS) ×2 IMPLANT
PACK CARDIAC CATHETERIZATION (CUSTOM PROCEDURE TRAY) ×2 IMPLANT
TRANSDUCER W/STOPCOCK (MISCELLANEOUS) ×2 IMPLANT
TUBING CIL FLEX 10 FLL-RA (TUBING) ×2 IMPLANT

## 2020-05-09 NOTE — Interval H&P Note (Signed)
History and Physical Interval Note:  05/09/2020 2:09 PM  Angel Price  has presented today for surgery, with the diagnosis of chest pain,CAD.  The various methods of treatment have been discussed with the patient and family. After consideration of risks, benefits and other options for treatment, the patient has consented to  Procedure(s): LEFT HEART CATH AND CORONARY ANGIOGRAPHY (N/A) as a surgical intervention.  The patient's history has been reviewed, patient examined, no change in status, stable for surgery.  I have reviewed the patient's chart and labs.  Questions were answered to the patient's satisfaction.    2016 Appropriate Use Criteria for Coronary Revascularization in Patients With Acute Coronary Syndrome NSTEMI/UA Intermediate Risk (TIMI Score 3-4) NSTEMI/Unstable angina, stabilized patient at Intermediate Risk (TIMI Score 3-4) Link Here: http://dodson-rose.net/ Indication:  Revascularization by PCI or CABG of 1 or more arteries in a patient with NSTEMI or unstable angina with Stabilization after presentation Intermediate risk for clinical events  A (7) Indication: 16; Score 7   [place]

## 2020-05-09 NOTE — H&P (Signed)
OV 04/27/2020 copied for documentation     Follow up visit  Subjective:   Angel Price, female    DOB: December 13, 1965, 55 y.o.   MRN: [id number]   C/C: Chest pain  55 y.o. African female with hypertension, h/o tobacco and alcohol dependence, coronary artery disease, now with anginal symptoms and palpitations.    Patient has had worsening chest pain and dyspnea on exertion with minimal activity. She also has palpitation symptoms with minimal activity. She is currently taking both atenolol 50 mg and metoprolol tartrate 25 mg bid.   Initial consultation HPI 06/2019: Patient has been hospitalized twice in last 6 weeks with complaints of loss of consciousness and questionable cardiac arrest.  Both times, patient reportedly had episodes of loss of consciousness, and underwent CPR by first responders, but had pulse present when EMS arrived.  At least 1 of these occasions, patient actually remembers chest compressions being performed.  One of these episodes happened while patient was Covid positive, fortunately without any severe illness related to it.  Both times, she has had elevated alcohol level.  She has also had complaints of unilateral weakness with CT head and MRI being unremarkable.  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   Given left main and triple-vessel calcifications noted, she underwent nuclear stress test which did not show any ischemia.  Echocardiogram showed structurally normal heart without any significant abnormalities.  Patient continues to have constant chest pain throughout the day, only improved when she sleeps at night.  Patient states that she has cut down her alcohol intake to about 1 beer per day.  She smokes 5 cigarettes/day.  In the past, patient used to have a sixpack every day.  She endorses having had too much to drink when she found out that her godmother passed off of it.  This is about the time she was admitted to the hospital and found to  have alcohol level elevated.  No current facility-administered medications on file prior to encounter.   Current Outpatient Medications on File Prior to Encounter  Medication Sig Dispense Refill  . acetaminophen (TYLENOL) 500 MG tablet Take 2 tablets (1,000 mg total) by mouth 3 (three) times daily as needed for mild pain or moderate pain. 30 tablet 0  . amLODipine (NORVASC) 10 MG tablet Take 1 tablet (10 mg total) by mouth daily. 30 tablet 2  . aspirin EC 81 MG EC tablet Take 1 tablet (81 mg total) by mouth daily. 30 tablet 0  . atenolol (TENORMIN) 100 MG tablet Take 0.5 tablets (50 mg total) by mouth daily. (Patient taking differently: Take 100 mg by mouth daily.) 90 tablet 2  . escitalopram (LEXAPRO) 20 MG tablet Take 20 mg by mouth daily.    . isosorbide mononitrate (IMDUR) 30 MG 24 hr tablet Take 1 tablet (30 mg total) by mouth daily. 30 tablet 3  . mirtazapine (REMERON) 15 MG tablet Take 15 mg by mouth at bedtime.    . promethazine (PHENERGAN) 12.5 MG tablet Take 12.5 mg by mouth every 6 (six) hours as needed for nausea or vomiting.     . rosuvastatin (CRESTOR) 20 MG tablet Take 1 tablet (20 mg total) by mouth daily. (Patient taking differently: Take 20 mg by mouth at bedtime.) 90 tablet 3  . nitroGLYCERIN (NITROSTAT) 0.4 MG SL tablet Place 1 tablet (0.4 mg total) under the tongue every 5 (five) minutes as needed for chest pain. (Patient taking differently: Place 0.4 mg under the tongue every 5 (  five) minutes x 3 doses as needed for chest pain.) 30 tablet 3  . pantoprazole (PROTONIX) 20 MG tablet Take 1 tablet (20 mg total) by mouth daily. (Patient not taking: Reported on 05/02/2020) 30 tablet 0    Cardiovascular & other pertient studies:  EKG 04/27/2020: Sinus rhythm 88 bpm Normal EKG  ABI 01/04/2020: This exam reveals normal perfusion of the right and left lower extremity  (ABI 1.00) with normal triphasic waveform pattern at the ankles.  Real time outpatient cardiac telemetry  06/25/2019: Dominant rhythm: Sinus. HR 52-149 bpm. Avg HR 94 bpm. Occasional ventricular and supraventricular ectopy seen. No atrial fibrillation/atrial flutter/SVT/VT/high grade AV block, sinus pause >3sec noted. Manually triggered events correlate with sinus rhythm/tachycardia.  CT Chest 06/15/2019: 1. No CT findings for pulmonary embolism. 2. Stable mild tortuosity and ectasia of the thoracic aorta but no aneurysm or dissection. 3. Stable three-vessel coronary artery calcifications. 4. Stable patchy airspace opacity in the right upper lobe, likely atelectasis or residual inflammation. 5. No mediastinal or hilar mass or adenopathy.  Lexiscan Tetrofosmin stress test 06/14/2019: No previous exam available for comparison. Lexiscan nuclear stress test performed using 1-day protocol. Stress symptoms included chest pain, dyspnea, dizziness. Stress EKG is non-diagnostic, as this is pharmacological stress test. In additional, stress EKG shows sinus tachycardia at 83% MPHR, no ischemic changes. Myocardial perfusion imaging is normal. Stress LVEF 72% with normal myocardial thickening. Low risk study.   Echocardiogram 05/20/2019: 1. Left ventricular ejection fraction, by estimation, is 65 to 70%. The  left ventricle has hyperdynamic function. The left ventricle has no  regional wall motion abnormalities. Left ventricular diastolic parameters  are consistent with Grade I diastolic  dysfunction (impaired relaxation).  2. Right ventricular systolic function is normal. The right ventricular  size is normal.  3. The mitral valve is normal in structure and function. No evidence of  mitral valve regurgitation. No evidence of mitral stenosis.  4. The aortic valve is normal in structure and function. Aortic valve  regurgitation is not visualized. No aortic stenosis is present.  5. The inferior vena cava is normal in size with greater than 50%  respiratory variability, suggesting right atrial  pressure of 3 mmHg.   Recent labs: 11/16/2019: Glucose 97, BUN/Cr 9/0.59. EGFR >60. Na/K 140/3.9. Rest of the CMP normal H/H 10.8/33.2. MCV 77. Platelets 294  06/17/2019: Glucose 116, BUN/Cr 11/0.53. EGFR normal. Na/K 141/3.8. Albumin 3.3. Rest of the CMP normal H/H 11.7/34.1. MCV 83. Platelets 302 TG 270  2018: Chol 165, TG 166, HDL 33, LDL 99 TSH 1.5 normal    Review of Systems  Cardiovascular: Positive for chest pain, claudication, palpitations and syncope. Negative for dyspnea on exertion and leg swelling.  Respiratory: Positive for shortness of breath.          There were no vitals filed for this visit.   There is no height or weight on file to calculate BMI. There were no vitals filed for this visit.   Objective:   Physical Exam Vitals and nursing note reviewed.  Constitutional:      Appearance: She is well-developed.  Neck:     Vascular: No JVD.  Cardiovascular:     Rate and Rhythm: Regular rhythm. Tachycardia present.     Pulses: Intact distal pulses.          Dorsalis pedis pulses are 0 on the right side and 0 on the left side.       Posterior tibial pulses are 1+ on the right side and  1+ on the left side.     Heart sounds: Normal heart sounds. No murmur heard.   Pulmonary:     Effort: Pulmonary effort is normal.     Breath sounds: Normal breath sounds. No wheezing or rales.           Assessment & Recommendations:   55 y.o. African female with hypertension, tobacco and alcohol dependence, coronary artery disease, now with anginal symptoms and palpitations.    CAD with angina:  Three-vessel coronary atherosclerosis seen on CT chest 06/2019.  No ischemia on stress testing 05/2019, but now with worsening angina symptoms, now unstable. Increase atenolol to 100 mg daily, stop metoprolol. Start Imdur 30 mg daily. Continue aspirin, statin Continue sublingual nitroglycerin as needed.  Given worsening symptoms and concern for unstable angina, will  proceed with coronary angiogram and possible intervention  Palpitations: PAC, PVC seen on monitor in 06/2019. Continue atenolol. Will monitor Apple watch for any arrhtymias   Nigel Mormon, MD [facility] Pager: [phone] Office: [phone]

## 2020-05-09 NOTE — Discharge Instructions (Signed)
Radial Site Care  This sheet gives you information about how to care for yourself after your procedure. Your health care provider may also give you more specific instructions. If you have problems or questions, contact your health care provider. What can I expect after the procedure? After the procedure, it is common to have:  Bruising and tenderness at the catheter insertion area. Follow these instructions at home: Medicines  Take over-the-counter and prescription medicines only as told by your health care provider. Insertion site care 1. Follow instructions from your health care provider about how to take care of your insertion site. Make sure you: ? Wash your hands with soap and water before you remove your bandage (dressing). If soap and water are not available, use hand sanitizer. ? May remove dressing in 24 hours. 2. Check your insertion site every day for signs of infection. Check for: ? Redness, swelling, or pain. ? Fluid or blood. ? Pus or a bad smell. ? Warmth. 3. Do no take baths, swim, or use a hot tub for 5 days. 4. You may shower 24-48 hours after the procedure. ? Remove the dressing and gently wash the site with plain soap and water. ? Pat the area dry with a clean towel. ? Do not rub the site. That could cause bleeding. 5. Do not apply powder or lotion to the site. Activity  1. For 24 hours after the procedure, or as directed by your health care provider: ? Do not flex or bend the affected arm. ? Do not push or pull heavy objects with the affected arm. ? Do not drive yourself home from the hospital or clinic. You may drive 24 hours after the procedure. ? Do not operate machinery or power tools. ? KEEP ARM ELEVATED THE REMAINDER OF THE DAY. 2. Do not push, pull or lift anything that is heavier than 10 lb for 5 days. 3. Ask your health care provider when it is okay to: ? Return to work or school. ? Resume usual physical activities or sports. ? Resume sexual  activity. General instructions  If the catheter site starts to bleed, raise your arm and put firm pressure on the site. If the bleeding does not stop, get help right away. This is a medical emergency.  DRINK PLENTY OF FLUIDS FOR THE NEXT 2-3 DAYS.  No alcohol consumption for 24 hours after receiving sedation.  If you went home on the same day as your procedure, a responsible adult should be with you for the first 24 hours after you arrive home.  Keep all follow-up visits as told by your health care provider. This is important. Contact a health care provider if:  You have a fever.  You have redness, swelling, or yellow drainage around your insertion site. Get help right away if:  You have unusual pain at the radial site.  The catheter insertion area swells very fast.  The insertion area is bleeding, and the bleeding does not stop when you hold steady pressure on the area.  Your arm or hand becomes pale, cool, tingly, or numb. These symptoms may represent a serious problem that is an emergency. Do not wait to see if the symptoms will go away. Get medical help right away. Call your local emergency services (911 in the [place] Do not drive yourself to the hospital. Summary  After the procedure, it is common to have bruising and tenderness at the site.  Follow instructions from your health care provider about how to take care   of your radial site wound. Check the wound every day for signs of infection.  This information is not intended to replace advice given to you by your health care provider. Make sure you discuss any questions you have with your health care provider. Document Revised: 04/09/2017 Document Reviewed: 04/09/2017 Elsevier Patient Education  2020 [employer] 

## 2020-05-09 NOTE — Progress Notes (Signed)
Ambulated to the bathroom to void tol well  

## 2020-05-09 NOTE — Progress Notes (Signed)
Discharge instructions reviewed with pt and her fiance (via telephone) both voice understanding.

## 2020-05-10 ENCOUNTER — Encounter (HOSPITAL_COMMUNITY): Payer: Self-pay | Admitting: Cardiology

## 2020-05-11 ENCOUNTER — Other Ambulatory Visit: Payer: Medicare Other

## 2020-05-24 ENCOUNTER — Ambulatory Visit: Payer: Medicare Other

## 2020-05-24 ENCOUNTER — Other Ambulatory Visit: Payer: Self-pay

## 2020-05-24 DIAGNOSIS — I25110 Atherosclerotic heart disease of native coronary artery with unstable angina pectoris: Secondary | ICD-10-CM | POA: Diagnosis not present

## 2020-05-26 DIAGNOSIS — F17200 Nicotine dependence, unspecified, uncomplicated: Secondary | ICD-10-CM | POA: Diagnosis not present

## 2020-05-26 DIAGNOSIS — I10 Essential (primary) hypertension: Secondary | ICD-10-CM | POA: Diagnosis not present

## 2020-05-31 ENCOUNTER — Other Ambulatory Visit: Payer: Self-pay

## 2020-05-31 ENCOUNTER — Encounter: Payer: Self-pay | Admitting: Cardiology

## 2020-05-31 ENCOUNTER — Ambulatory Visit: Payer: Medicare Other | Admitting: Cardiology

## 2020-05-31 VITALS — BP 136/97 | HR 92 | Temp 98.3°F | Resp 17 | Ht 63.0 in | Wt 160.0 lb

## 2020-05-31 DIAGNOSIS — I208 Other forms of angina pectoris: Secondary | ICD-10-CM | POA: Diagnosis not present

## 2020-05-31 DIAGNOSIS — E782 Mixed hyperlipidemia: Secondary | ICD-10-CM

## 2020-05-31 MED ORDER — ISOSORBIDE MONONITRATE ER 60 MG PO TB24: 30.0000 mg | ORAL_TABLET | Freq: Every day | ORAL | 3 refills | Status: DC

## 2020-05-31 NOTE — Progress Notes (Signed)
Follow up visit  Subjective:   Angel Price, female    DOB: 1965/06/15, 55 y.o.   MRN: [id number]    Chief Complaint  Patient presents with  . Follow-up  . Coronary artery disease involving native coronary artery of    55 y.o. African female with hypertension, h/o tobacco and alcohol dependence, coronary artery disease, now with anginal symptoms and palpitations.    Coronary angiogram showed no obstructive disease, but slow flow likely due to microvascular dysfunction. Patient has had improvement in her chest pain symptoms. Blood pressure remains elevated.   Initial consultation HPI 06/2019: Patient has been hospitalized twice in last 6 weeks with complaints of loss of consciousness and questionable cardiac arrest.  Both times, patient reportedly had episodes of loss of consciousness, and underwent CPR by first responders, but had pulse present when EMS arrived.  At least 1 of these occasions, patient actually remembers chest compressions being performed.  One of these episodes happened while patient was Covid positive, fortunately without any severe illness related to it.  Both times, she has had elevated alcohol level.  She has also had complaints of unilateral weakness with CT head and MRI being unremarkable.  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   Given left main and triple-vessel calcifications noted, she underwent nuclear stress test which did not show any ischemia.  Echocardiogram showed structurally normal heart without any significant abnormalities.  Patient continues to have constant chest pain throughout the day, only improved when she sleeps at night.  Patient states that she has cut down her alcohol intake to about 1 beer per day.  She smokes 5 cigarettes/day.  In the past, patient used to have a sixpack every day.  She endorses having had too much to drink when she found out that her godmother passed off of it.  This is about the time she was  admitted to the hospital and found to have alcohol level elevated.  Current Outpatient Medications on File Prior to Visit  Medication Sig Dispense Refill  . acetaminophen (TYLENOL) 500 MG tablet Take 2 tablets (1,000 mg total) by mouth 3 (three) times daily as needed for mild pain or moderate pain. 30 tablet 0  . amLODipine (NORVASC) 10 MG tablet Take 1 tablet (10 mg total) by mouth daily. 30 tablet 2  . aspirin EC 81 MG EC tablet Take 1 tablet (81 mg total) by mouth daily. 30 tablet 0  . atenolol (TENORMIN) 100 MG tablet Take 0.5 tablets (50 mg total) by mouth daily. (Patient taking differently: Take 100 mg by mouth daily.) 90 tablet 2  . escitalopram (LEXAPRO) 20 MG tablet Take 20 mg by mouth daily.    . isosorbide mononitrate (IMDUR) 30 MG 24 hr tablet Take 1 tablet (30 mg total) by mouth daily. 30 tablet 3  . mirtazapine (REMERON) 15 MG tablet Take 15 mg by mouth at bedtime.    . nitroGLYCERIN (NITROSTAT) 0.4 MG SL tablet Place 1 tablet (0.4 mg total) under the tongue every 5 (five) minutes as needed for chest pain. (Patient taking differently: Place 0.4 mg under the tongue every 5 (five) minutes x 3 doses as needed for chest pain.) 30 tablet 3  . pantoprazole (PROTONIX) 20 MG tablet Take 1 tablet (20 mg total) by mouth daily. 30 tablet 0  . promethazine (PHENERGAN) 12.5 MG tablet Take 12.5 mg by mouth every 6 (six) hours as needed for nausea or vomiting.     . rosuvastatin (CRESTOR) 20  MG tablet Take 1 tablet (20 mg total) by mouth daily. (Patient taking differently: Take 20 mg by mouth at bedtime.) 90 tablet 3   No current facility-administered medications on file prior to visit.    Cardiovascular & other pertient studies:  Coronary angiography 05/09/2020: LM: Normal LAD: Prox 20% calcific disease        Diag 2 ostial 50%, prox 40% stenoses LCx: No significant disease RCA: Distal RCA focal 40% stenosis  TIMI II flow in all vessels in spite of IC NTG suggests possible microvascular  dysfunction   EKG 04/27/2020: Sinus rhythm 88 bpm Normal EKG  ABI 01/04/2020: This exam reveals normal perfusion of the right and left lower extremity  (ABI 1.00) with normal triphasic waveform pattern at the ankles.  Real time outpatient cardiac telemetry 06/25/2019: Dominant rhythm: Sinus. HR 52-149 bpm. Avg HR 94 bpm. Occasional ventricular and supraventricular ectopy seen. No atrial fibrillation/atrial flutter/SVT/VT/high grade AV block, sinus pause >3sec noted. Manually triggered events correlate with sinus rhythm/tachycardia.  CT Chest 06/15/2019: 1. No CT findings for pulmonary embolism. 2. Stable mild tortuosity and ectasia of the thoracic aorta but no aneurysm or dissection. 3. Stable three-vessel coronary artery calcifications. 4. Stable patchy airspace opacity in the right upper lobe, likely atelectasis or residual inflammation. 5. No mediastinal or hilar mass or adenopathy.  Lexiscan Tetrofosmin stress test 06/14/2019: No previous exam available for comparison. Lexiscan nuclear stress test performed using 1-day protocol. Stress symptoms included chest pain, dyspnea, dizziness. Stress EKG is non-diagnostic, as this is pharmacological stress test. In additional, stress EKG shows sinus tachycardia at 83% MPHR, no ischemic changes. Myocardial perfusion imaging is normal. Stress LVEF 72% with normal myocardial thickening. Low risk study.   Echocardiogram 05/20/2019: 1. Left ventricular ejection fraction, by estimation, is 65 to 70%. The  left ventricle has hyperdynamic function. The left ventricle has no  regional wall motion abnormalities. Left ventricular diastolic parameters  are consistent with Grade I diastolic  dysfunction (impaired relaxation).  2. Right ventricular systolic function is normal. The right ventricular  size is normal.  3. The mitral valve is normal in structure and function. No evidence of  mitral valve regurgitation. No evidence of mitral  stenosis.  4. The aortic valve is normal in structure and function. Aortic valve  regurgitation is not visualized. No aortic stenosis is present.  5. The inferior vena cava is normal in size with greater than 50%  respiratory variability, suggesting right atrial pressure of 3 mmHg.   Recent labs: 11/16/2019: Glucose 97, BUN/Cr 9/0.59. EGFR >60. Na/K 140/3.9. Rest of the CMP normal H/H 10.8/33.2. MCV 77. Platelets 294  06/17/2019: Glucose 116, BUN/Cr 11/0.53. EGFR normal. Na/K 141/3.8. Albumin 3.3. Rest of the CMP normal H/H 11.7/34.1. MCV 83. Platelets 302 TG 270  2018: Chol 165, TG 166, HDL 33, LDL 99 TSH 1.5 normal    Review of Systems  Cardiovascular: Positive for chest pain (Chest pain improved). Negative for dyspnea on exertion, leg swelling, palpitations and syncope.         Vitals:   05/31/20 1236 05/31/20 1248  BP: (!) 151/105 (!) 136/97  Pulse: 94 92  Resp: 17   Temp: 98.3 F (36.8 C)   SpO2: 96% 96%     Body mass index is 28.34 kg/m. Filed Weights   05/31/20 1236  Weight: 160 lb (72.6 kg)     Objective:   Physical Exam Vitals and nursing note reviewed.  Constitutional:      Appearance: She is well-developed.  Neck:     Vascular: No JVD.  Cardiovascular:     Rate and Rhythm: Regular rhythm. Tachycardia present.     Pulses: Intact distal pulses.          Dorsalis pedis pulses are 0 on the right side and 0 on the left side.       Posterior tibial pulses are 1+ on the right side and 1+ on the left side.     Heart sounds: Normal heart sounds. No murmur heard.   Pulmonary:     Effort: Pulmonary effort is normal.     Breath sounds: Normal breath sounds. No wheezing or rales.           Assessment & Recommendations:   55 y.o. African female with hypertension, tobacco and alcohol dependence, coronary artery disease, now with anginal symptoms and palpitations.    CAD with angina:  No obstructive CAD. Suspect microvascular dysfunction  (Coronary angiography 04/2020) Increase Imdur to 60 mg. Continue rest of the medical therapy.  Palpitations: PAC, PVC seen on monitor in 06/2019. No Afib seen on review of Apple watch tracings  F?u in 6 months  [place], MD [facility] [place] Pager: [phone] Office: [phone]

## 2020-06-26 DIAGNOSIS — K219 Gastro-esophageal reflux disease without esophagitis: Secondary | ICD-10-CM | POA: Diagnosis not present

## 2020-06-26 DIAGNOSIS — I10 Essential (primary) hypertension: Secondary | ICD-10-CM | POA: Diagnosis not present

## 2020-06-30 IMAGING — CT CT ABD-PELV W/ CM
2 of 5 series · 16 of 46 positions shown, 18 images · IV contrast (Omnipaque or Isovue)
Comparison: Multiple prior studies

CLINICAL DATA: Nausea vomiting, abdominal wall fistulas

EXAM:
CT ABDOMEN AND PELVIS WITH CONTRAST
TECHNIQUE: Multidetector CT imaging of the abdomen and pelvis was performed
using the standard protocol following bolus administration of
intravenous contrast.
CONTRAST:  100mL OMNIPAQUE IOHEXOL 300 MG/ML  SOLN

[Series 2: axial st · axial · 0.66mm/px · z∈[-645,-245]mm · 13 of 94 slices shown, 15 images]
[im 7/94  soft-tissue]
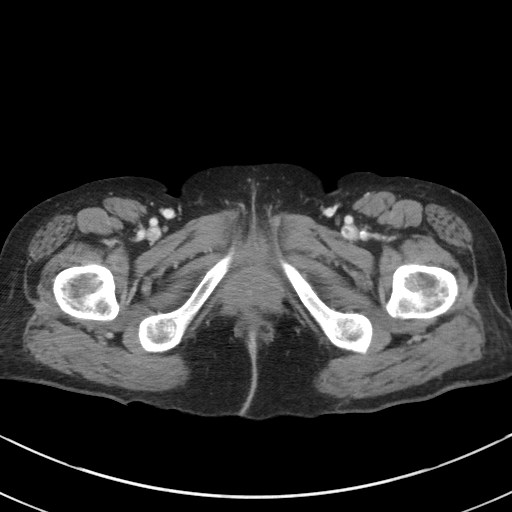
[im 7/94  bone]
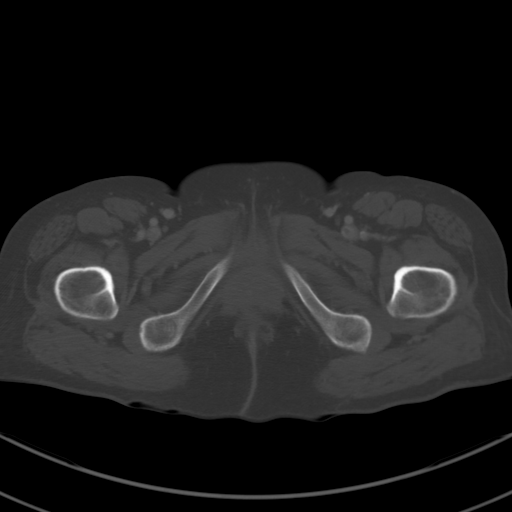
[im 14/94  soft-tissue]
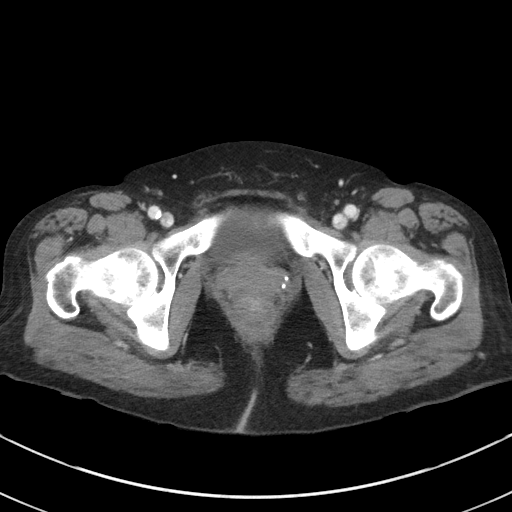
[im 20/94  soft-tissue]
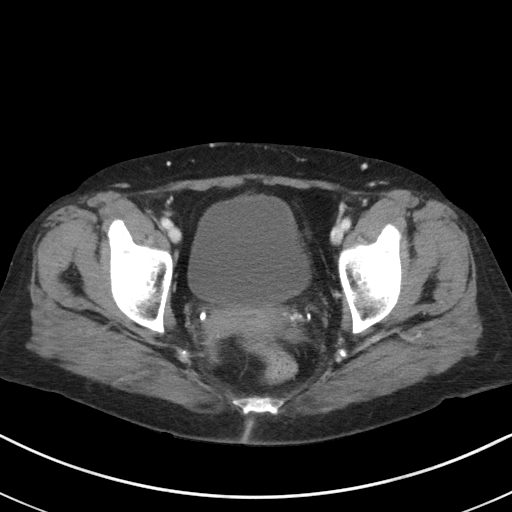
[im 27/94  soft-tissue]
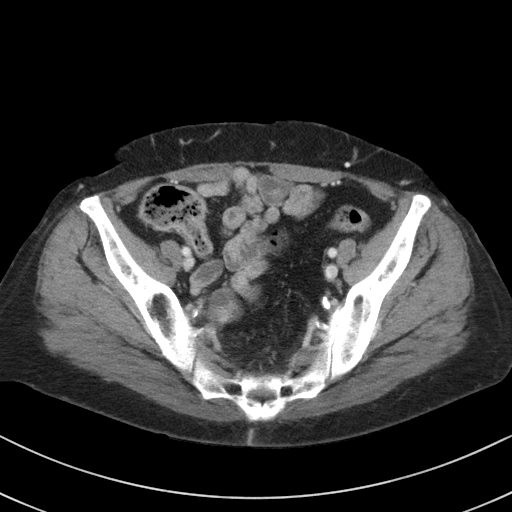
[im 34/94  soft-tissue]
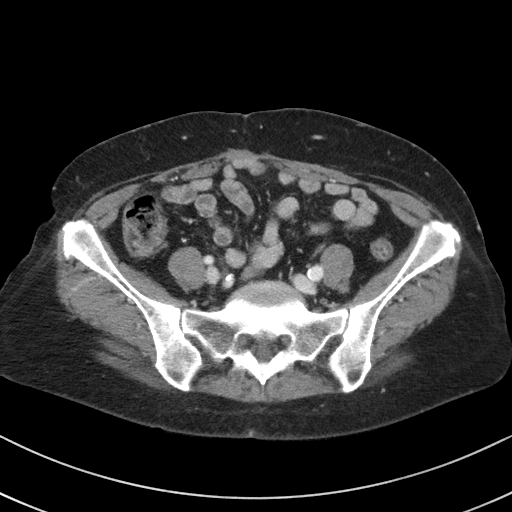
[im 40/94  soft-tissue]
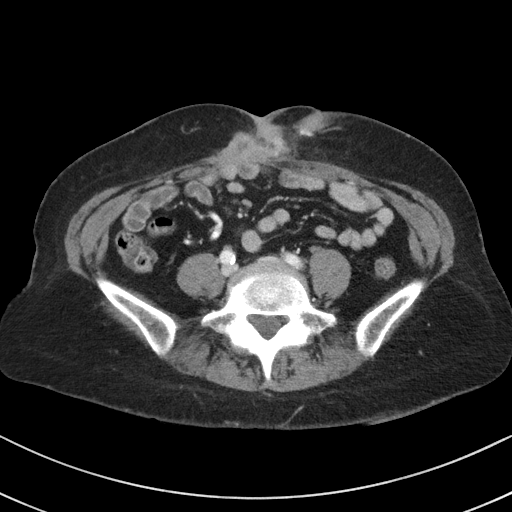
[im 47/94  soft-tissue]
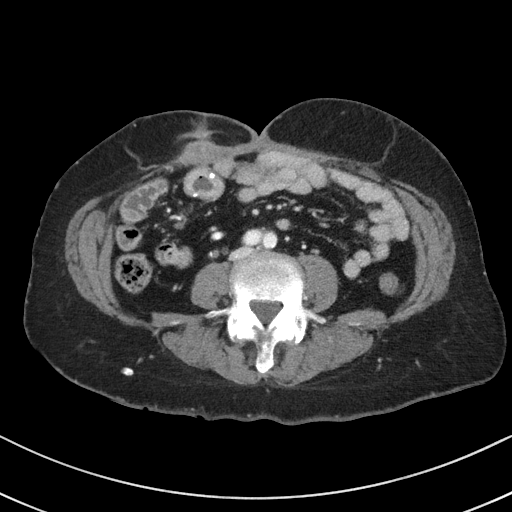
[im 54/94  soft-tissue]
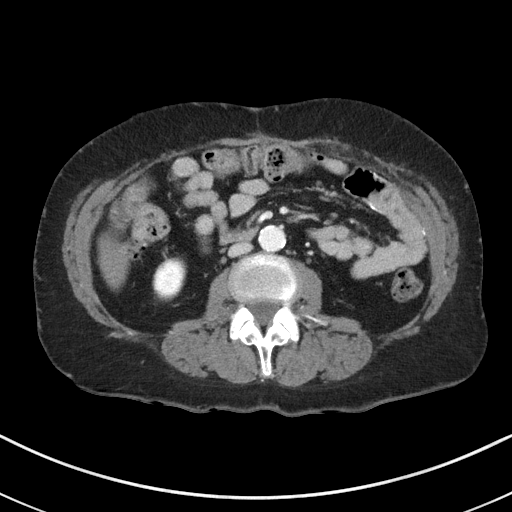
[im 60/94  soft-tissue]
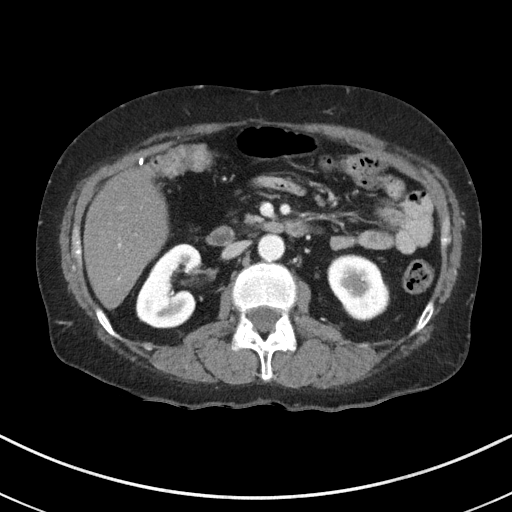
[im 60/94  bone]
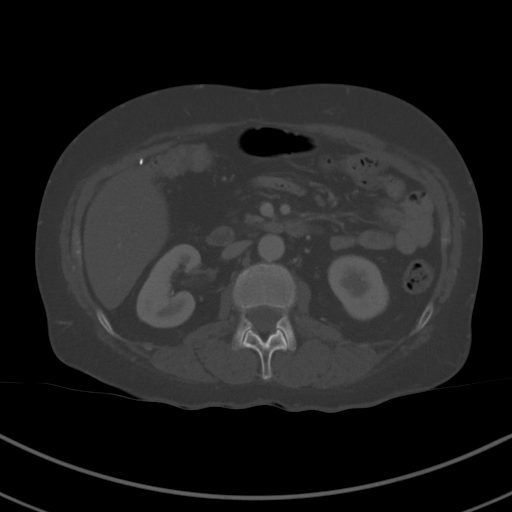
[im 67/94  soft-tissue]
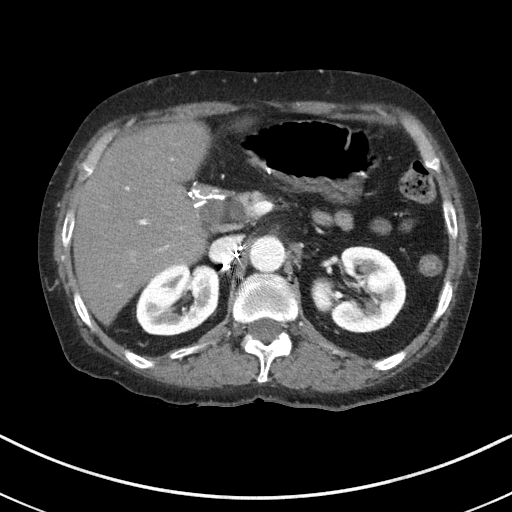
[im 74/94  soft-tissue]
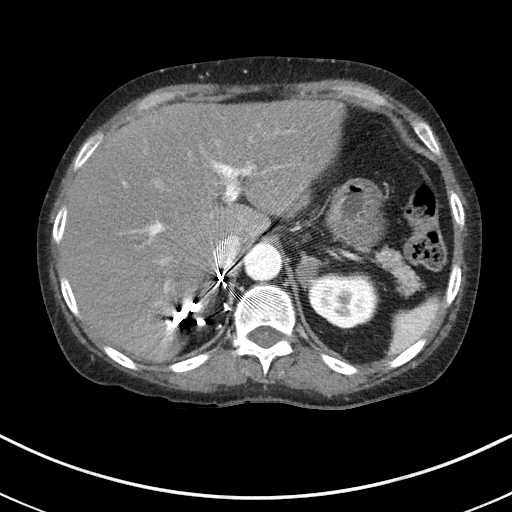
[im 80/94  soft-tissue]
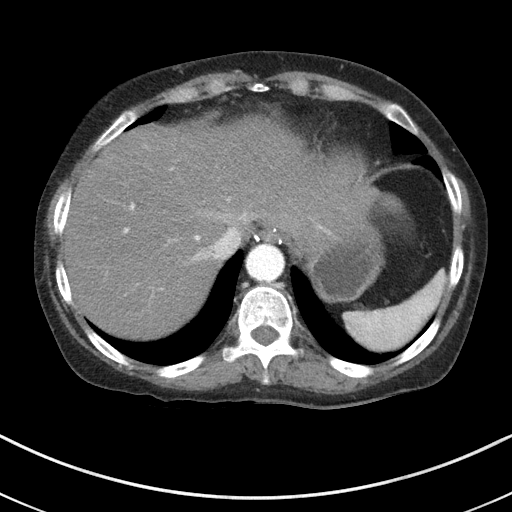
[im 87/94  soft-tissue]
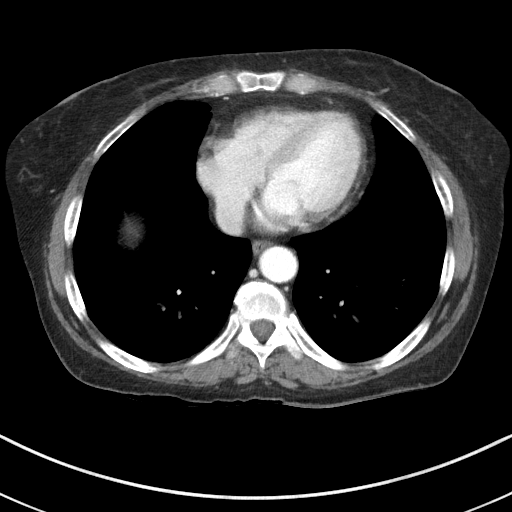

[Series 5: coronal st · coronal · 0.71mm/px · 3 of 86 slices shown]
[im 29/86  soft-tissue]
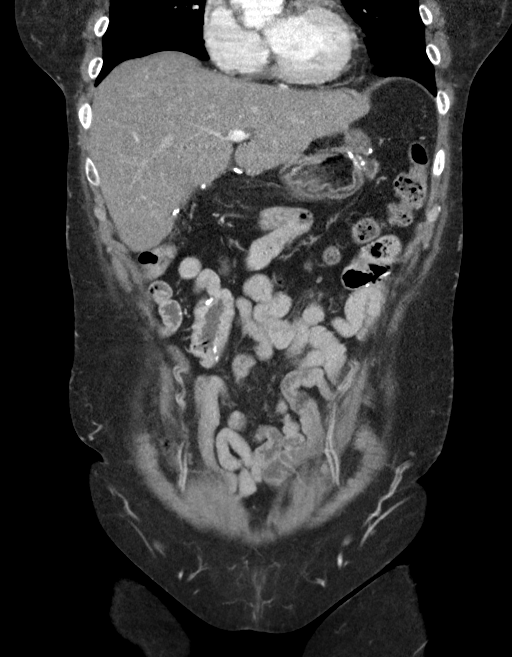
[im 38/86  soft-tissue]
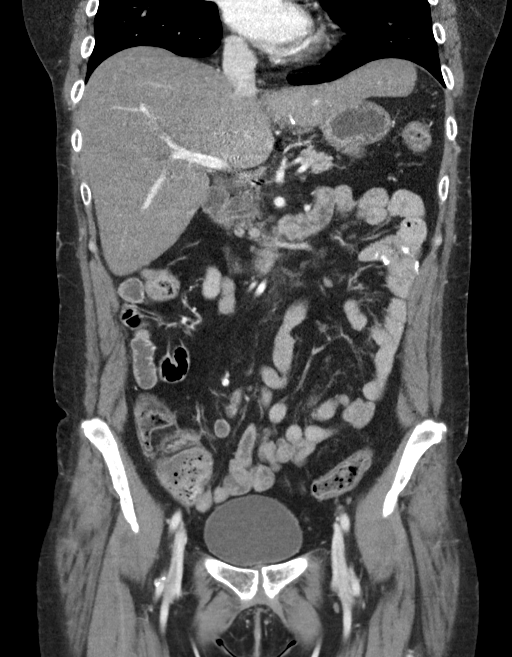
[im 48/86  soft-tissue]
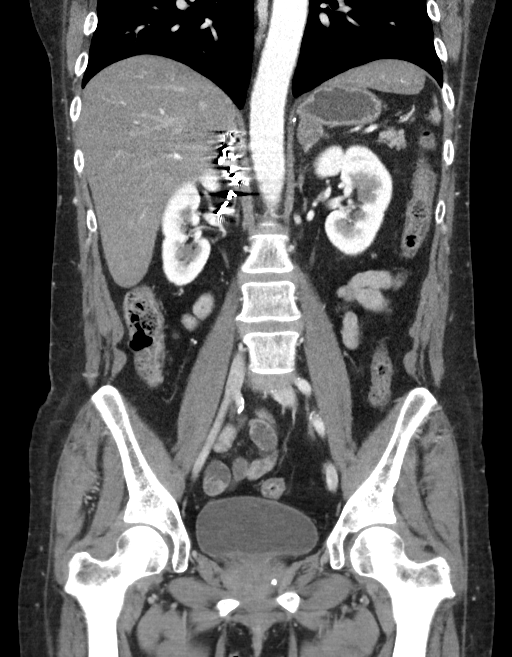

[16 of 46 positions shown; findings below may reference images not displayed]

FINDINGS: Lower chest: No acute abnormality.

Hepatobiliary: Fatty infiltration. Post cholecystectomy. No biliary
dilatation.

Pancreas: Unremarkable.

Spleen: Unremarkable.

Adrenals/Urinary Tract: Stable left adrenal mass compatible with an
adenoma. Right adrenalectomy. Kidneys are unremarkable. Bladder is
unremarkable.

Stomach/Bowel: There are postsurgical changes of gastrojejunostomy.
Small bowel anastomoses are present. There is wall thickening
associated with the anastomosis in the right abdomen also present on
prior. There is no evidence of bowel obstruction. Small bowel loops
are apposed to the ventral abdominal wall likely reflecting
adhesions.

Vascular/Lymphatic: Aortic atherosclerosis. No enlarged abdominal or
pelvic lymph nodes.

Reproductive: Status post hysterectomy. No adnexal masses.

Other: Soft tissue density in the ventral abdominal wall is similar
in appearance extending to the skin surface, which may reflect
reported fistulas.

Musculoskeletal: No new significant osseous findings.
IMPRESSION: No substantial change since most recent study of 11/11/2018. Wall
thickening associated with small bowel anastomosis on the right.
However, there is no evidence bowel obstruction.

Stable appearance of soft tissue density in the ventral abdominal
wall extending to the skin surface. No new collection.

## 2020-07-02 ENCOUNTER — Other Ambulatory Visit: Payer: Self-pay | Admitting: Cardiology

## 2020-07-02 DIAGNOSIS — I25118 Atherosclerotic heart disease of native coronary artery with other forms of angina pectoris: Secondary | ICD-10-CM

## 2020-07-02 DIAGNOSIS — R002 Palpitations: Secondary | ICD-10-CM

## 2020-07-04 DIAGNOSIS — I25118 Atherosclerotic heart disease of native coronary artery with other forms of angina pectoris: Secondary | ICD-10-CM | POA: Diagnosis not present

## 2020-07-04 DIAGNOSIS — M329 Systemic lupus erythematosus, unspecified: Secondary | ICD-10-CM | POA: Diagnosis not present

## 2020-07-04 DIAGNOSIS — K432 Incisional hernia without obstruction or gangrene: Secondary | ICD-10-CM | POA: Diagnosis not present

## 2020-07-12 DIAGNOSIS — R29898 Other symptoms and signs involving the musculoskeletal system: Secondary | ICD-10-CM | POA: Diagnosis not present

## 2020-07-12 DIAGNOSIS — Z955 Presence of coronary angioplasty implant and graft: Secondary | ICD-10-CM | POA: Diagnosis not present

## 2020-07-12 DIAGNOSIS — R1110 Vomiting, unspecified: Secondary | ICD-10-CM | POA: Diagnosis not present

## 2020-07-12 DIAGNOSIS — Z743 Need for continuous supervision: Secondary | ICD-10-CM | POA: Diagnosis not present

## 2020-07-12 DIAGNOSIS — R110 Nausea: Secondary | ICD-10-CM | POA: Diagnosis not present

## 2020-07-12 DIAGNOSIS — I10 Essential (primary) hypertension: Secondary | ICD-10-CM | POA: Diagnosis not present

## 2020-07-12 DIAGNOSIS — Z720 Tobacco use: Secondary | ICD-10-CM | POA: Diagnosis not present

## 2020-07-12 DIAGNOSIS — Z885 Allergy status to narcotic agent status: Secondary | ICD-10-CM | POA: Diagnosis not present

## 2020-07-12 DIAGNOSIS — R079 Chest pain, unspecified: Secondary | ICD-10-CM | POA: Diagnosis not present

## 2020-07-12 DIAGNOSIS — R0789 Other chest pain: Secondary | ICD-10-CM | POA: Diagnosis not present

## 2020-07-12 DIAGNOSIS — R6889 Other general symptoms and signs: Secondary | ICD-10-CM | POA: Diagnosis not present

## 2020-07-12 DIAGNOSIS — Z888 Allergy status to other drugs, medicaments and biological substances status: Secondary | ICD-10-CM | POA: Diagnosis not present

## 2020-07-12 DIAGNOSIS — A09 Infectious gastroenteritis and colitis, unspecified: Secondary | ICD-10-CM | POA: Diagnosis not present

## 2020-07-26 DIAGNOSIS — I10 Essential (primary) hypertension: Secondary | ICD-10-CM | POA: Diagnosis not present

## 2020-07-26 DIAGNOSIS — Z1389 Encounter for screening for other disorder: Secondary | ICD-10-CM | POA: Diagnosis not present

## 2020-07-26 DIAGNOSIS — Z0001 Encounter for general adult medical examination with abnormal findings: Secondary | ICD-10-CM | POA: Diagnosis not present

## 2020-07-26 DIAGNOSIS — K219 Gastro-esophageal reflux disease without esophagitis: Secondary | ICD-10-CM | POA: Diagnosis not present

## 2020-08-15 ENCOUNTER — Other Ambulatory Visit: Payer: Self-pay

## 2020-08-15 ENCOUNTER — Encounter (HOSPITAL_COMMUNITY): Payer: Self-pay | Admitting: *Deleted

## 2020-08-15 ENCOUNTER — Emergency Department (HOSPITAL_COMMUNITY)
Admission: EM | Admit: 2020-08-15 | Discharge: 2020-08-15 | Disposition: A | Discharge status: Home / Self Care | Payer: Medicare Other | Attending: Emergency Medicine | Admitting: Emergency Medicine

## 2020-08-15 ENCOUNTER — Emergency Department (HOSPITAL_COMMUNITY): Payer: Medicare Other

## 2020-08-15 DIAGNOSIS — R1012 Left upper quadrant pain: Secondary | ICD-10-CM | POA: Diagnosis not present

## 2020-08-15 DIAGNOSIS — R112 Nausea with vomiting, unspecified: Secondary | ICD-10-CM | POA: Diagnosis not present

## 2020-08-15 DIAGNOSIS — Z8616 Personal history of COVID-19: Secondary | ICD-10-CM | POA: Insufficient documentation

## 2020-08-15 DIAGNOSIS — I2510 Atherosclerotic heart disease of native coronary artery without angina pectoris: Secondary | ICD-10-CM | POA: Insufficient documentation

## 2020-08-15 DIAGNOSIS — K432 Incisional hernia without obstruction or gangrene: Secondary | ICD-10-CM | POA: Diagnosis not present

## 2020-08-15 DIAGNOSIS — K219 Gastro-esophageal reflux disease without esophagitis: Secondary | ICD-10-CM | POA: Insufficient documentation

## 2020-08-15 DIAGNOSIS — Z79899 Other long term (current) drug therapy: Secondary | ICD-10-CM | POA: Diagnosis not present

## 2020-08-15 DIAGNOSIS — Z7982 Long term (current) use of aspirin: Secondary | ICD-10-CM | POA: Insufficient documentation

## 2020-08-15 DIAGNOSIS — R140 Abdominal distension (gaseous): Secondary | ICD-10-CM | POA: Diagnosis not present

## 2020-08-15 DIAGNOSIS — I119 Hypertensive heart disease without heart failure: Secondary | ICD-10-CM | POA: Insufficient documentation

## 2020-08-15 DIAGNOSIS — I10 Essential (primary) hypertension: Secondary | ICD-10-CM | POA: Diagnosis not present

## 2020-08-15 DIAGNOSIS — F17210 Nicotine dependence, cigarettes, uncomplicated: Secondary | ICD-10-CM | POA: Diagnosis not present

## 2020-08-15 DIAGNOSIS — D3500 Benign neoplasm of unspecified adrenal gland: Secondary | ICD-10-CM | POA: Diagnosis not present

## 2020-08-15 DIAGNOSIS — K6389 Other specified diseases of intestine: Secondary | ICD-10-CM | POA: Diagnosis not present

## 2020-08-15 LAB — COMPREHENSIVE METABOLIC PANEL
ALT: 25 U/L (ref 0–44)
AST: 24 U/L (ref 15–41)
Albumin: 3.6 g/dL (ref 3.5–5.0)
Alkaline Phosphatase: 69 U/L (ref 38–126)
Anion gap: 6 (ref 5–15)
BUN: 8 mg/dL (ref 6–20)
CO2: 25 mmol/L (ref 22–32)
Calcium: 8.9 mg/dL (ref 8.9–10.3)
Chloride: 103 mmol/L (ref 98–111)
Creatinine, Ser: 0.59 mg/dL (ref 0.44–1.00)
GFR, Estimated: >60 mL/min (ref >60)
Glucose, Bld: 108 mg/dL — ABNORMAL HIGH (ref 70–99)
Potassium: 3.1 mmol/L — ABNORMAL LOW (ref 3.5–5.1)
Sodium: 134 mmol/L — ABNORMAL LOW (ref 135–145)
Total Bilirubin: 0.7 mg/dL (ref 0.3–1.2)
Total Protein: 7.3 g/dL (ref 6.5–8.1)

## 2020-08-15 LAB — CBC WITH DIFFERENTIAL/PLATELET
Abs Immature Granulocytes: 0.02 10*3/uL (ref 0.00–0.07)
Basophils Absolute: 0 10*3/uL (ref 0.0–0.1)
Basophils Relative: 0 %
Eosinophils Absolute: 0.1 10*3/uL (ref 0.0–0.5)
Eosinophils Relative: 1 %
HCT: 34.5 % — ABNORMAL LOW (ref 36.0–46.0)
Hemoglobin: 11.6 g/dL — ABNORMAL LOW (ref 12.0–15.0)
Immature Granulocytes: 0 %
Lymphocytes Relative: 35 %
Lymphs Abs: 2.4 10*3/uL (ref 0.7–4.0)
MCH: 27.2 pg (ref 26.0–34.0)
MCHC: 33.6 g/dL (ref 30.0–36.0)
MCV: 81 fL (ref 80.0–100.0)
Monocytes Absolute: 0.6 10*3/uL (ref 0.1–1.0)
Monocytes Relative: 9 %
Neutro Abs: 3.7 10*3/uL (ref 1.7–7.7)
Neutrophils Relative %: 55 %
Platelets: 310 10*3/uL (ref 150–400)
RBC: 4.26 MIL/uL (ref 3.87–5.11)
RDW: 17.4 % — ABNORMAL HIGH (ref 11.5–15.5)
WBC: 7 10*3/uL (ref 4.0–10.5)
nRBC: 0 % (ref 0.0–0.2)

## 2020-08-15 LAB — LIPASE, BLOOD: Lipase: 19 U/L (ref 11–51)

## 2020-08-15 LAB — TROPONIN I (HIGH SENSITIVITY): Troponin I (High Sensitivity): 3 ng/L (ref <18)

## 2020-08-15 MED ORDER — FENTANYL CITRATE (PF) 100 MCG/2ML IJ SOLN
50.0000 ug | Freq: Once | INTRAMUSCULAR | Status: AC
  Administered 2020-08-15: 50 ug via INTRAVENOUS
  Filled 2020-08-15: qty 2

## 2020-08-15 MED ORDER — POTASSIUM CHLORIDE CRYS ER 20 MEQ PO TBCR
40.0000 meq | EXTENDED_RELEASE_TABLET | Freq: Once | ORAL | Status: AC
  Administered 2020-08-15: 40 meq via ORAL
  Filled 2020-08-15: qty 2

## 2020-08-15 MED ORDER — SODIUM CHLORIDE 0.9 % IV BOLUS
1000.0000 mL | Freq: Once | INTRAVENOUS | Status: AC
  Administered 2020-08-15: 1000 mL via INTRAVENOUS

## 2020-08-15 MED ORDER — MORPHINE SULFATE (PF) 4 MG/ML IV SOLN
4.0000 mg | Freq: Once | INTRAVENOUS | Status: AC
  Administered 2020-08-15: 4 mg via INTRAVENOUS
  Filled 2020-08-15: qty 1

## 2020-08-15 MED ORDER — FENTANYL CITRATE (PF) 100 MCG/2ML IJ SOLN
75.0000 ug | Freq: Once | INTRAMUSCULAR | Status: DC
  Filled 2020-08-15: qty 2

## 2020-08-15 MED ORDER — DICYCLOMINE HCL 10 MG/ML IM SOLN
20.0000 mg | Freq: Once | INTRAMUSCULAR | Status: AC
  Administered 2020-08-15: 20 mg via INTRAMUSCULAR
  Filled 2020-08-15: qty 2

## 2020-08-15 MED ORDER — ONDANSETRON HCL 4 MG/2ML IJ SOLN
4.0000 mg | Freq: Once | INTRAMUSCULAR | Status: AC
  Administered 2020-08-15: 4 mg via INTRAVENOUS
  Filled 2020-08-15: qty 2

## 2020-08-15 NOTE — Discharge Instructions (Addendum)
Imaging reveals your right sided hernia is roughly unchanged, I recommend continuing with your home medication as prescribed.  I like you to follow-up with your general surgeon for further evaluation of this.  Lab work shows that you have low potassium given you supplemental potassium here today please follow-up with your PCP for further evaluation.  Come back to the emergency department if you develop chest pain, shortness of breath, severe abdominal pain, uncontrolled nausea, vomiting, diarrhea.

## 2020-08-15 NOTE — ED Notes (Signed)
Pt requesting pain medication.  

## 2020-08-15 NOTE — ED Provider Notes (Signed)
[facility] EMERGENCY DEPARTMENT Provider Note   CSN: [id number] Arrival date & time: 08/15/20  1052     History Chief Complaint  Patient presents with  . Abdominal Pain    Angel Price is a 55 y.o. female.  HPI    Patient with significant medical history of chronic abdominal pain, GERD, hiatal hernia, hysterectomy, appendicectomy cholecystectomy, abdominal abscesses, presents to the emergency room with chief complaint of abdominal pain.  Patient states starting on Thursday she had uncontrolled nausea, vomiting and constipation, she denies any stomach pain at this time, she states she has been unable to tolerate p.o., she denies seeing hematemesis, denies any urinary symptoms.  She then states this morning when she went to sit up she felt something pop in her abdomen and has been having constant pain since then, states the pain is in her upper left quadrant, does not radiate, it is worsened when she moves, she denies any alleviating factors at this time.  Patient denies systemic infection like fevers or chills, she denies nasal congestion sore throat or cough, she denies recent sick contacts, is up-to-date on her COVID and her influenza shot.  Patient has had significant abdominal history most recent explant of a mesh hernia which was performed on 09/10/2019 and later developed a 10 mm fascial to her mid line containing fat and bowel.  She is scheduled for hernia surgery within the next 6 weeks as long as she quit smoking, patient is being followed by Dr. Staci Acosta.   Past Medical History:  Diagnosis Date  . Abscess    soft tissue  . Adrenal mass ([place])   . Alcohol abuse   . Anxiety   . Blood transfusion without reported diagnosis   . Chronic abdominal pain   . Chronic wound infection of abdomen   . Colon polyp    colonoscopy 04/2014  . Depression   . Diverticulosis    colonoscopy 04/2014 moderat pan colonic  . Drug-seeking behavior   . Gastritis    EGD 05/2014  .  Gastroparesis Nov 2015  . GERD (gastroesophageal reflux disease)   . Hemorrhoid    internal large  . Hiatal hernia   . History of Billroth II operation   . Hypertension   . Lung nodule    CT 02/2014 needs repeat 1 month  . Lung nodule < 6cm on CT 04/25/2014  . Lupus ([place])   . Malingering   . Nausea and vomiting    chronic, recurrent  . Pancreatitis   . Schatzki's ring    patent per EGD 04/2014  . Sickle cell trait ([place])   . Suicide attempt ([place])   . 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 Mixed hyperlipidemia 04/27/2020  . Pain in both lower extremities 12/29/2019  . Coronary artery disease involving native coronary artery of native heart with unstable angina pectoris ([place]) 06/25/2019  . Shortness of breath 06/15/2019  . Microvascular angina ([place]) 06/15/2019  . Acute respiratory failure with hypoxia ([place]) 05/20/2019  . Essential hypertension 05/20/2019  . COVID-19 05/20/2019  . Syncope and collapse 05/20/2019  . Right sided weakness 05/16/2019  . Metabolic acidosis 33/29/5188  . Intractable vomiting with nausea 02/23/2019  . Abnormal CT scan, colon 12/09/2018  . Abnormal computed tomography of cecum and terminal ileum 12/09/2018  . Cocaine abuse ([place])   . Colitis 11/11/2018  . Hemorrhoidal skin tag   . SBO (small bowel  obstruction) ([place])   . Non-intractable vomiting with nausea   . Folate deficiency 05/11/2017  . Abdominal wall fistula   . Cellulitis of abdominal wall 01/02/2017  . Acute left hemiparesis ([place]) 12/05/2016  . Malingering 12/05/2016  . Weakness of left lower extremity 12/02/2016  . CVA (cerebral vascular accident) ([place]) 11/23/2016  . Bright red rectal bleeding 08/22/2016  . Hypotension due to blood loss   . Acute GI bleeding 05/24/2016  . Dieulafoy lesion of duodenum   . History of Billroth II operation   . Gastrointestinal hemorrhage 05/22/2016  . Absolute anemia   . Acute pancreatitis  10/01/2015  . Abnormal CT scan of lung 10/01/2015  . Alcohol abuse with intoxication ([place])   . Left-sided weakness   . Psychosomatic factor in physical condition   . Upper GI bleed   . Diverticulosis of colon with hemorrhage   . Chronic wound infection of abdomen 12/22/2014  . 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  . HTN (hypertension) 12/22/2014  . Ataxia 11/01/2014  . Hemorrhoids 04/26/2014  . Lung nodule 04/26/2014  . Diverticulosis   . Gastritis   . Hiatal hernia   . Schatzki's ring   . Acute blood loss anemia 04/25/2014  . Sinus tachycardia 04/25/2014  . Hypokalemia 04/25/2014  . Hematemesis with nausea 04/25/2014  . Lung nodule < 6cm on CT 04/25/2014  . Intractable nausea and vomiting 04/24/2014  . Gastroparesis   . Abdominal pain 04/01/2014  . Chronic abdominal pain 01/21/2014  . Gastroenteritis 12/10/2013  . Chronic abdominal wound infection 08/16/2013  . MDD (major depressive disorder), recurrent episode, severe ([place]) 06/27/2013  . Wrist laceration 06/24/2013  . Diarrhea 05/07/2013  . Rectal bleeding 05/07/2013  . Abnormal LFTs 05/07/2013  . Adrenal mass, left ([place]) 05/07/2013  . Abdominal wall abscess at site of surgical wound 04/19/2013  . Abdominal wall abscess 04/18/2013  . Frequent headaches 03/30/2013  . Sleep difficulties 03/30/2013  . Essential hypertension, benign 03/30/2013  . History of cocaine abuse ([place]) 03/16/2013  . History of schizoaffective disorder 03/16/2013  . Bipolar disorder ([place]) 03/16/2013  . Personality disorder ([place]) 03/16/2013  . Tobacco abuse 03/16/2013  . Alcohol abuse 03/16/2013  . Palpitations 03/16/2013  . Poor vision 03/16/2013  . History of gastric bypass 03/16/2013  . Status post hysterectomy with oophorectomy 03/16/2013  . Hypothyroid 03/16/2013  . Lupus ([place]) 03/16/2013    Past Surgical History:  Procedure Laterality Date  . ABDOMINAL HYSTERECTOMY  2013   [place]  . ABDOMINAL HYSTERECTOMY    . ABDOMINAL SURGERY    .  ADRENALECTOMY Right   . AGILE CAPSULE N/A 01/05/2015   Procedure: AGILE CAPSULE;  Surgeon: Daneil Dolin, MD;  Location: [facility];  Service: Endoscopy;  Laterality: N/A;  0700  . Billroth II procedure      [place], first 2000, 2005/2006.  . bilroth 2    . BIOPSY  05/20/2013   Procedure: BIOPSIES OF ASCENDING AND SIGMOID COLON;  Surgeon: Daneil Dolin, MD;  Location: [facility] ORS;  Service: Endoscopy;;  . BIOPSY  04/26/2014   Procedure: BIOPSIES;  Surgeon: Danie Binder, MD;  Location: [facility];  Service: Endoscopy;;  . BIOPSY  12/24/2018   Procedure: BIOPSY;  Surgeon: Daneil Dolin, MD;  Location: [facility];  Service: Endoscopy;;  colon  . CHOLECYSTECTOMY    . COLONOSCOPY     in [place]  . COLONOSCOPY WITH PROPOFOL N/A 05/20/2013   Dr.Rourk- inadequate prep, normal appearing rectum, grossly normal colon aside from pancolonic diverticula,  normal terminal ileum bx= unremarkable colonic mucosa. Due for early interval 2016.   Marland Kitchen COLONOSCOPY WITH PROPOFOL N/A 04/26/2014   WIO:XBDZHG ileum/one colon polyp removed/moderate pan-colonic diverticulosis/large internal hemorrhoids  . COLONOSCOPY WITH PROPOFOL N/A 12/23/2014   Dr.Rourk- minimal internal hemorrhoids, pancolonic diverticulosis  . COLONOSCOPY WITH PROPOFOL N/A 08/23/2016   Procedure: COLONOSCOPY WITH PROPOFOL;  Surgeon: Danie Binder, MD;  [facility];  Service: Endoscopy;  Laterality: N/A;  . COLONOSCOPY WITH PROPOFOL N/A 12/24/2018   Pancolonic diverticulosis, normal TI, one 5 mm polyp in rectum (tubular adenoma). Somewhat friable hemorrhagic mucosa in area of IC valve/cecum, possibly related to scope trauma s/p biopsy.   . DEBRIDEMENT OF ABDOMINAL WALL ABSCESS N/A 02/08/2013   Procedure: DEBRIDEMENT OF ABDOMINAL WALL ABSCESS;  Surgeon: Jamesetta So, MD;  Location: [facility];  Service: General;  Laterality: N/A;  . ESOPHAGOGASTRODUODENOSCOPY (EGD) WITH PROPOFOL N/A 05/20/2013   Dr.Rourk- s/p prior gastric surgery with normal  esophagus, residual gastric mucosa and patent efferent limb  . ESOPHAGOGASTRODUODENOSCOPY (EGD) WITH PROPOFOL N/A 02/03/2014   Dr. Gala Romney:  s/p hemigastrectomy with retained gastric contents. Residual gastric mucosa and efferent limb appeared normal otherwise. Query gastroparesis.   Marland Kitchen ESOPHAGOGASTRODUODENOSCOPY (EGD) WITH PROPOFOL N/A 04/26/2014   DJM:[id number]'Q ring/small HH/mild non-erosive gasrtitis/normal anastomosis  . ESOPHAGOGASTRODUODENOSCOPY (EGD) WITH PROPOFOL N/A 12/23/2014   Dr.Rourk- s/p prior hemigastrctomy, active oozing from anastomotic suture site, hemostasis achieved  . ESOPHAGOGASTRODUODENOSCOPY (EGD) WITH PROPOFOL N/A 05/23/2016   Dr. 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 ESOPHAGOGASTRODUODENOSCOPY (EGD) WITH PROPOFOL N/A 05/11/2017   Procedure: ESOPHAGOGASTRODUODENOSCOPY (EGD) WITH PROPOFOL;  Surgeon: Daneil Dolin, MD;  [facility];  Service: Endoscopy;  Laterality: N/A;  . EXCISION OF MESH  08/2019  . HEMORRHOID SURGERY N/A 06/18/2017   Procedure: THREE COLUMN EXTENSIVE HEMORRHOIDECTOMY;  Surgeon: Virl Cagey, MD;  Location: [facility];  Service: General;  Laterality: N/A;  . INCISION AND DRAINAGE ABSCESS N/A 01/06/2017   Procedure: INCISION AND DRAINAGE ABDOMINAL WALL ABSCESS;  Surgeon: Aviva Signs, MD;  Location: [facility];  Service: General;  Laterality: N/A;  . LEFT HEART CATH AND CORONARY ANGIOGRAPHY N/A 05/09/2020   Procedure: LEFT HEART CATH AND CORONARY ANGIOGRAPHY;  Surgeon: Nigel Mormon, MD;  Location: [facility];  Service: Cardiovascular;  Laterality: N/A;  . POLYPECTOMY  12/24/2018   Procedure: POLYPECTOMY;  Surgeon: Daneil Dolin, MD;  [facility];  Service: Endoscopy;;  colon  . tendon repar Right    wrist  . WOUND EXPLORATION Right 06/24/2013   Procedure: exploration of traumatic wound right wrist;  Surgeon: Tennis Must, MD;   Location: [facility];  Service: Orthopedics;  Laterality: Right;     OB History    Gravida  4   Para  4   Term  4   Preterm  0   AB  0   Living        SAB  0   IAB  0   Ectopic  0   Multiple      Live Births              Family History  Problem Relation Age of Onset  . Lung cancer Father   . Cancer Father        mets  . Brain cancer Son   . Schizophrenia Son   . Cancer Son  brain  . Drug abuse Mother   . Breast cancer Maternal Aunt   . Bipolar disorder Maternal Aunt   . Drug abuse Maternal Aunt   . Colon cancer Maternal Grandmother        late 66s, early 41s  . Drug abuse Sister   . Drug abuse Brother   . Bipolar disorder Paternal Grandfather   . Bipolar disorder Cousin   . Liver disease Neg Hx     Social History   Tobacco Use  . Smoking status: Current Some Day Smoker    Packs/day: 0.25    Years: 35.00    Pack years: 8.75    Types: Cigarettes  . Smokeless tobacco: Never Used  Vaping Use  . Vaping Use: Never used  Substance Use Topics  . Alcohol use: Not Currently  . Drug use: Not Currently    Types: Cocaine    Home Medications Prior to Admission medications   Medication Sig Start Date End Date Taking? Authorizing Provider  acetaminophen (TYLENOL) 500 MG tablet Take 2 tablets (1,000 mg total) by mouth 3 (three) times daily as needed for mild pain or moderate pain. 02/26/19  Yes Enzo Bi, MD  amLODipine (NORVASC) 10 MG tablet Take 1 tablet (10 mg total) by mouth daily. 06/16/19  Yes Pokhrel, Laxman, MD  aspirin EC 81 MG EC tablet Take 1 tablet (81 mg total) by mouth daily. 05/19/19  Yes Elgergawy, Silver Huguenin, MD  atenolol (TENORMIN) 100 MG tablet Take 0.5 tablets (50 mg total) by mouth daily. Patient taking differently: Take 100 mg by mouth daily. 04/27/20  Yes Patwardhan, Manish J, MD  escitalopram (LEXAPRO) 20 MG tablet Take 20 mg by mouth daily. 04/05/20  Yes [provider]  isosorbide mononitrate (IMDUR) 60 MG 24 hr tablet Take  0.5 tablets (30 mg total) by mouth daily. 05/31/20 08/29/20 Yes Patwardhan, Manish J, MD  mirtazapine (REMERON) 15 MG tablet Take 15 mg by mouth at bedtime. 04/22/20  Yes [provider]  nitroGLYCERIN (NITROSTAT) 0.4 MG SL tablet Place 1 tablet (0.4 mg total) under the tongue every 5 (five) minutes as needed for chest pain. Patient taking differently: Place 0.4 mg under the tongue every 5 (five) minutes x 3 doses as needed for chest pain. 12/29/19 03/28/20 Yes Patwardhan, Manish J, MD  pantoprazole (PROTONIX) 20 MG tablet Take 1 tablet (20 mg total) by mouth daily. 11/07/19  Yes Couture, Cortni S, PA-C  promethazine (PHENERGAN) 12.5 MG tablet Take 12.5 mg by mouth every 6 (six) hours as needed for nausea or vomiting.  12/18/19  Yes [provider]  rosuvastatin (CRESTOR) 20 MG tablet Take 1 tablet (20 mg total) by mouth daily. Patient taking differently: Take 20 mg by mouth at bedtime. 06/25/19 11/06/28 Yes Patwardhan, Reynold Bowen, MD    Allergies    Metoclopramide and Reglan [metoclopramide]  Review of Systems   Review of Systems  Constitutional: Negative for chills and fever.  HENT: Negative for congestion.   Respiratory: Negative for shortness of breath.   Cardiovascular: Negative for chest pain.  Gastrointestinal: Positive for abdominal pain, constipation, nausea and vomiting. Negative for diarrhea.  Genitourinary: Negative for dysuria and enuresis.  Musculoskeletal: Negative for back pain.  Skin: Negative for rash.  Neurological: Negative for dizziness and headaches.  Hematological: Does not bruise/bleed easily.    Physical Exam Updated Vital Signs BP (!) 133/100   Pulse 78   Temp 98.5 F (36.9 C) (Oral)   Resp 14   Ht 5\' 3"  (1.6  m)   Wt 72.1 kg   LMP  (LMP Unknown)   SpO2 96%   BMI 28.17 kg/m   Physical Exam Vitals and nursing note reviewed.  Constitutional:      General: She is not in acute distress.    Appearance: She is not ill-appearing.  HENT:     Head:  Normocephalic and atraumatic.     Nose: No congestion.  Eyes:     Conjunctiva/sclera: Conjunctivae normal.  Cardiovascular:     Rate and Rhythm: Normal rate and regular rhythm.     Pulses: Normal pulses.     Heart sounds: No murmur heard. No friction rub. No gallop.   Pulmonary:     Effort: No respiratory distress.     Breath sounds: No wheezing, rhonchi or rales.  Abdominal:     General: There is distension.     Palpations: Abdomen is soft.     Tenderness: There is abdominal tenderness. There is no right CVA tenderness or left CVA tenderness.     Comments: Abdomen is distended, normal bowel sounds, dull to percussion, she was tender to palpation in her epigastric and left upper quadrant, no guarding, rebound tenderness, peritoneal sign, she negative CVA tenderness.  Musculoskeletal:     Right lower leg: No edema.     Left lower leg: No edema.  Skin:    General: Skin is warm and dry.  Neurological:     Mental Status: She is alert.  Psychiatric:        Mood and Affect: Mood normal.     ED Results / Procedures / Treatments   Labs (all labs ordered are listed, but only abnormal results are displayed) Labs Reviewed  COMPREHENSIVE METABOLIC PANEL - Abnormal; Notable for the following components:      Result Value   Sodium 134 (*)    Potassium 3.1 (*)    Glucose, Bld 108 (*)    All other components within normal limits  CBC WITH DIFFERENTIAL/PLATELET - Abnormal; Notable for the following components:   Hemoglobin 11.6 (*)    HCT 34.5 (*)    RDW 17.4 (*)    All other components within normal limits  LIPASE, BLOOD  TROPONIN I (HIGH SENSITIVITY)    EKG EKG Interpretation  Date/Time:  Tuesday Aug 15 2020 11:57:31 EDT Ventricular Rate:  74 PR Interval:  145 QRS Duration: 86 QT Interval:  400 QTC Calculation: 444 R Axis:   44 Text Interpretation: Sinus rhythm Baseline wander in lead(s) V2 No significant change since prior 10/21 Confirmed by Aletta Edouard [phone]) on  08/15/2020 4:40:07 PM   Radiology CT ABDOMEN PELVIS WO CONTRAST  Result Date: 08/15/2020 CLINICAL DATA:  Abdominal pain, nausea and vomiting for 3 days. The patient heard a popping sound in her abdomen getting out of bed today. EXAM: CT ABDOMEN AND PELVIS WITHOUT CONTRAST TECHNIQUE: Multidetector CT imaging of the abdomen and pelvis was performed following the standard protocol without IV contrast. COMPARISON:  CT abdomen and pelvis 11/16/2019. FINDINGS: Lower chest: Mild dependent atelectasis. Lung bases otherwise clear. No pleural or pericardial effusion. Hepatobiliary: No focal liver abnormality is seen. Status post cholecystectomy. No biliary dilatation. Pancreas: Unremarkable. No pancreatic ductal dilatation or surrounding inflammatory changes. Spleen: Normal in size without focal abnormality. Adrenals/Urinary Tract: Left adrenal adenoma is unchanged. The right adrenal gland has been removed. Multiple surgical staples are present above the right kidney. The kidneys, ureters and urinary bladder appear normal. Stomach/Bowel: Again seen is postoperative change partial gastrectomy gastrojejunostomy. There is  no evidence of bowel obstruction. Wall thickening about the enterostomy site in the left abdomen is identified and could be due to distension or possibly inflammatory change. As on the prior exam, some small bowel loops appear adhesed to the anterior abdominal wall. No pneumatosis or portal venous gas. The appendix is not seen and may have been removed. Vascular/Lymphatic: Aortic atherosclerosis. No enlarged abdominal or pelvic lymph nodes. Reproductive: Status post hysterectomy. No adnexal masses. Other: Since the prior examination, the patient has developed a hernia to the right of midline which contains loops of small and large bowel without obstruction. The defect measures approximately 5 cm craniocaudal by 11 cm transverse. No ascites or focal fluid collection. Musculoskeletal: No acute focal  abnormality. IMPRESSION: The patient has a new right side abdominal wall ventral hernia containing loops of small large bowel without obstruction. Mild wall thickening about the patient's enterostomy site is likely due to under distension but could reflect inflammatory change. Loop of bowel at the enterostomy is within the patient's new hernia. No change in a benign left adrenal adenoma. Postoperative change as described above. Aortic Atherosclerosis (ICD10-I70.0). Electronically Signed   By: Inge Rise M.D.   On: 08/15/2020 15:56    Procedures Procedures   Medications Ordered in ED Medications  potassium chloride SA (KLOR-CON) CR tablet 40 mEq (has no administration in time range)  sodium chloride 0.9 % bolus 1,000 mL (1,000 mLs Intravenous New Bag/Given 08/15/20 1414)  ondansetron (ZOFRAN) injection 4 mg (4 mg Intravenous Given 08/15/20 1414)  fentaNYL (SUBLIMAZE) injection 50 mcg (50 mcg Intravenous Given 08/15/20 1413)  dicyclomine (BENTYL) injection 20 mg (20 mg Intramuscular Given 08/15/20 1523)  morphine 4 MG/ML injection 4 mg (4 mg Intravenous Given 08/15/20 1526)    ED Course  I have reviewed the triage vital signs and the nursing notes.  Pertinent labs & imaging results that were available during my care of the patient were reviewed by me and considered in my medical decision making (see chart for details).    MDM Rules/Calculators/A&P                         Initial impression-patient presents with abdominal pain.  She is alert, does not appear to be in acute distress, vital signs reassuring.  Concern for possible bowel obstruction versus incarcerated hernia, will will obtain basic lab work-up, the patient fluids pain medication and reassess.  Work-up-CBC shows normocytic anemia with heme of 1.6 appears to be baseline for patient, CMP shows slight hyponatremia 134, hypokalemia 3.1, glucose 108, troponin 3, lipase 19, EKG sinus without signs of ischemia.  CT abdomen pelvis  reveals new right-sided abdominal hernia containing a small large bowels without obstruction, mild wall thickening about the patient's enterostomy likely due to underdistention but could reflect inflammatory changes.  Reassessment-patient was reassessed after fluids, potassium, pain medications, she states she is feeling much better, has no complaints this time.  Updated on lab work imaging patient is agreeable  for discharge at this time.   Consult-due to complex abdominal surgical history will consult with general surgery for further recommendations.  Spoke with Dr. Constance Haw she reviewed imaging and does not feel findings warrants urgent surgery at this time.  She recommends her following back up with her surgeon for further evaluation.  Rule out-I have low suspicion for bowel obstruction and this was not seen on CT abdomen pelvis.  Low suspicion for intra-abdominal abscess as patient not have a white count, vital signs  reassuring, no signs infection present on CT abdomen pelvis.  Low suspicion for diverticulitis as there is no diverticulosis seen on CT scan, no leukocytosis present on CBC.  Low suspicion for AAA as there is no bulging mass present my exam, patient has low risk factors.  It is noted in imaging that there is a new right-sided abdominal hernia but using care everywhere this was seen on a prior CT scan on 11/21 and appears to be roughly the same size as today.  Low suspicion for incarcerated or strangulated hernia as she has no leukocytosis, no peritoneal sign, no ischemic changes seen on CT.  Plan-   1.  Right upper quadrant pain-suspect this secondary due to right-sided ventral hernia, will have patient follow-up with her general surgeon for further evaluation.  Gave her strict return precautions.  Vital signs have remained stable, no indication for hospital admission.   Patient given at home care as well strict return precautions.  Patient verbalized that they understood agreed to said  plan.   Final Clinical Impression(s) / ED Diagnoses Final diagnoses:  Left upper quadrant abdominal pain    Rx / DC Orders ED Discharge Orders    None       Marcello Fennel, PA-C 08/15/20 1646    Fredia Sorrow, MD 08/17/20 [phone]

## 2020-08-15 NOTE — ED Triage Notes (Signed)
Abdominal pain with vomiting 

## 2020-08-22 DIAGNOSIS — K432 Incisional hernia without obstruction or gangrene: Secondary | ICD-10-CM | POA: Diagnosis not present

## 2020-08-22 DIAGNOSIS — F17210 Nicotine dependence, cigarettes, uncomplicated: Secondary | ICD-10-CM | POA: Insufficient documentation

## 2020-08-23 ENCOUNTER — Other Ambulatory Visit: Payer: Self-pay

## 2020-08-23 ENCOUNTER — Encounter: Payer: Self-pay | Admitting: Cardiology

## 2020-08-23 ENCOUNTER — Ambulatory Visit: Payer: Medicare Other | Admitting: Cardiology

## 2020-08-23 VITALS — BP 131/91 | HR 92 | Temp 98.1°F | Resp 16 | Ht 63.0 in | Wt 159.8 lb

## 2020-08-23 DIAGNOSIS — E782 Mixed hyperlipidemia: Secondary | ICD-10-CM

## 2020-08-23 DIAGNOSIS — I10 Essential (primary) hypertension: Secondary | ICD-10-CM | POA: Diagnosis not present

## 2020-08-23 DIAGNOSIS — R002 Palpitations: Secondary | ICD-10-CM | POA: Diagnosis not present

## 2020-08-23 DIAGNOSIS — I25118 Atherosclerotic heart disease of native coronary artery with other forms of angina pectoris: Secondary | ICD-10-CM | POA: Diagnosis not present

## 2020-08-23 MED ORDER — ATENOLOL 100 MG PO TABS: 100.0000 mg | ORAL_TABLET | Freq: Every day | ORAL | 2 refills | Status: DC

## 2020-08-23 NOTE — Progress Notes (Signed)
Follow up visit  Subjective:   Angel Price, female    DOB: 24-Sep-1965, 55 y.o.   MRN: [id number]   Chief Complaint  Patient presents with  . Chest Pain  . Follow-up    3 months    55 y.o. African female with hypertension, h/o tobacco and alcohol dependence, coronary artery disease  Coronary angiogram (04/2020) showed no obstructive disease, but slow flow likely due to microvascular dysfunction.  Patient has not had any recurrent anginal symptoms.  However, she states that she has good days and bad days.  On good days, she is able to walk around without any difficulty.  On other days, she had episodes of her heart racing and thumping into her throat.  Patient has been transmitting her Apple Watch readings, details below.   Initial consultation HPI 06/2019: Patient has been hospitalized twice in last 6 weeks with complaints of loss of consciousness and questionable cardiac arrest.  Both times, patient reportedly had episodes of loss of consciousness, and underwent CPR by first responders, but had pulse present when EMS arrived.  At least 1 of these occasions, patient actually remembers chest compressions being performed.  One of these episodes happened while patient was Covid positive, fortunately without any severe illness related to it.  Both times, she has had elevated alcohol level.  She has also had complaints of unilateral weakness with CT head and MRI being unremarkable.  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   Given left main and triple-vessel calcifications noted, she underwent nuclear stress test which did not show any ischemia.  Echocardiogram showed structurally normal heart without any significant abnormalities.  Patient continues to have constant chest pain throughout the day, only improved when she sleeps at night.  Patient states that she has cut down her alcohol intake to about 1 beer per day.  She smokes 5 cigarettes/day.  In the past, patient  used to have a sixpack every day.  She endorses having had too much to drink when she found out that her godmother passed off of it.  This is about the time she was admitted to the hospital and found to have alcohol level elevated.  Current Outpatient Medications on File Prior to Visit  Medication Sig Dispense Refill  . acetaminophen (TYLENOL) 500 MG tablet Take 2 tablets (1,000 mg total) by mouth 3 (three) times daily as needed for mild pain or moderate pain. 30 tablet 0  . amLODipine (NORVASC) 10 MG tablet Take 1 tablet (10 mg total) by mouth daily. 30 tablet 2  . aspirin EC 81 MG EC tablet Take 1 tablet (81 mg total) by mouth daily. 30 tablet 0  . atenolol (TENORMIN) 100 MG tablet Take 0.5 tablets (50 mg total) by mouth daily. (Patient taking differently: Take 100 mg by mouth daily.) 90 tablet 2  . escitalopram (LEXAPRO) 20 MG tablet Take 20 mg by mouth daily.    . isosorbide mononitrate (IMDUR) 60 MG 24 hr tablet Take 0.5 tablets (30 mg total) by mouth daily. 90 tablet 3  . mirtazapine (REMERON) 15 MG tablet Take 15 mg by mouth at bedtime.    . nitroGLYCERIN (NITROSTAT) 0.4 MG SL tablet Place 1 tablet (0.4 mg total) under the tongue every 5 (five) minutes as needed for chest pain. (Patient taking differently: Place 0.4 mg under the tongue every 5 (five) minutes x 3 doses as needed for chest pain.) 30 tablet 3  . pantoprazole (PROTONIX) 20 MG tablet Take 1 tablet (  20 mg total) by mouth daily. 30 tablet 0  . promethazine (PHENERGAN) 12.5 MG tablet Take 12.5 mg by mouth every 6 (six) hours as needed for nausea or vomiting.     . rosuvastatin (CRESTOR) 20 MG tablet Take 1 tablet (20 mg total) by mouth daily. (Patient taking differently: Take 20 mg by mouth at bedtime.) 90 tablet 3   No current facility-administered medications on file prior to visit.    Cardiovascular & other pertient studies:  Cardiology monitoring reviewed 08/23/2020: Multiple episodes of sinus tachycardia up to 130 bpm  correlating with patient symptoms of palpitations. No other tachyarrhythmias seen.  Coronary angiography 05/09/2020: LM: Normal LAD: Prox 20% calcific disease        Diag 2 ostial 50%, prox 40% stenoses LCx: No significant disease RCA: Distal RCA focal 40% stenosis  TIMI II flow in all vessels in spite of IC NTG suggests possible microvascular dysfunction   EKG 04/27/2020: Sinus rhythm 88 bpm Normal EKG  ABI 01/04/2020: This exam reveals normal perfusion of the right and left lower extremity  (ABI 1.00) with normal triphasic waveform pattern at the ankles.  Real time outpatient cardiac telemetry 06/25/2019: Dominant rhythm: Sinus. HR 52-149 bpm. Avg HR 94 bpm. Occasional ventricular and supraventricular ectopy seen. No atrial fibrillation/atrial flutter/SVT/VT/high grade AV block, sinus pause >3sec noted. Manually triggered events correlate with sinus rhythm/tachycardia.  CT Chest 06/15/2019: 1. No CT findings for pulmonary embolism. 2. Stable mild tortuosity and ectasia of the thoracic aorta but no aneurysm or dissection. 3. Stable three-vessel coronary artery calcifications. 4. Stable patchy airspace opacity in the right upper lobe, likely atelectasis or residual inflammation. 5. No mediastinal or hilar mass or adenopathy.  Lexiscan Tetrofosmin stress test 06/14/2019: No previous exam available for comparison. Lexiscan nuclear stress test performed using 1-day protocol. Stress symptoms included chest pain, dyspnea, dizziness. Stress EKG is non-diagnostic, as this is pharmacological stress test. In additional, stress EKG shows sinus tachycardia at 83% MPHR, no ischemic changes. Myocardial perfusion imaging is normal. Stress LVEF 72% with normal myocardial thickening. Low risk study.   Echocardiogram 05/20/2019: 1. Left ventricular ejection fraction, by estimation, is 65 to 70%. The  left ventricle has hyperdynamic function. The left ventricle has no  regional wall motion  abnormalities. Left ventricular diastolic parameters  are consistent with Grade I diastolic  dysfunction (impaired relaxation).  2. Right ventricular systolic function is normal. The right ventricular  size is normal.  3. The mitral valve is normal in structure and function. No evidence of  mitral valve regurgitation. No evidence of mitral stenosis.  4. The aortic valve is normal in structure and function. Aortic valve  regurgitation is not visualized. No aortic stenosis is present.  5. The inferior vena cava is normal in size with greater than 50%  respiratory variability, suggesting right atrial pressure of 3 mmHg.   Recent labs: 11/16/2019: Glucose 97, BUN/Cr 9/0.59. EGFR >60. Na/K 140/3.9. Rest of the CMP normal H/H 10.8/33.2. MCV 77. Platelets 294  06/17/2019: Glucose 116, BUN/Cr 11/0.53. EGFR normal. Na/K 141/3.8. Albumin 3.3. Rest of the CMP normal H/H 11.7/34.1. MCV 83. Platelets 302 TG 270  2018: Chol 165, TG 166, HDL 33, LDL 99 TSH 1.5 normal    Review of Systems  Cardiovascular: Positive for palpitations. Negative for chest pain, dyspnea on exertion, leg swelling and syncope.        Vitals:   08/23/20 1100  BP: (!) 131/91  Pulse: 92  Resp: 16  Temp: 98.1 F (36.7 C)  SpO2: 97%     Body mass index is 28.31 kg/m. Filed Weights   08/23/20 1100  Weight: 159 lb 12.8 oz (72.5 kg)     Objective:   Physical Exam Vitals and nursing note reviewed.  Constitutional:      Appearance: She is well-developed.  Neck:     Vascular: No JVD.  Cardiovascular:     Rate and Rhythm: Regular rhythm. Tachycardia present.     Pulses: Intact distal pulses.          Dorsalis pedis pulses are 0 on the right side and 0 on the left side.       Posterior tibial pulses are 1+ on the right side and 1+ on the left side.     Heart sounds: Normal heart sounds. No murmur heard.   Pulmonary:     Effort: Pulmonary effort is normal.     Breath sounds: Normal breath sounds. No  wheezing or rales.  Musculoskeletal:     Right lower leg: No edema.     Left lower leg: No edema.           Assessment & Recommendations:   55 y.o. African female with hypertension, tobacco and alcohol dependence, coronary artery disease, now with anginal symptoms and palpitations.    CAD with angina:  No obstructive CAD. Suspect microvascular dysfunction (Coronary angiography 04/2020) Continue rest of the medical therapy. Currently on Imdur 60 mg daily, atenolol 50 mg daily, amlodipine 10 mg daily. Increase atenolol to 100 mg daily. Continue rosuvastatin.  Palpitations: PAC, PVC seen on monitor in 06/2019. Only episodes of sinus tachycardia on cardio logs apple watch monitoring.   Previously normal TSH (05/2019).  No Afib or any other tachyarrhythmias seen on review of Apple watch tracings Increase atenolol to 100 mg daily.  Fu in 6 weeks   Armstrong, MD [facility] Pager: [phone] Office: [phone]

## 2020-08-26 DIAGNOSIS — I10 Essential (primary) hypertension: Secondary | ICD-10-CM | POA: Diagnosis not present

## 2020-08-26 DIAGNOSIS — K219 Gastro-esophageal reflux disease without esophagitis: Secondary | ICD-10-CM | POA: Diagnosis not present

## 2020-08-29 ENCOUNTER — Encounter (HOSPITAL_COMMUNITY): Payer: Self-pay

## 2020-08-29 ENCOUNTER — Emergency Department (HOSPITAL_COMMUNITY): Payer: Medicare Other

## 2020-08-29 ENCOUNTER — Other Ambulatory Visit: Payer: Self-pay

## 2020-08-29 ENCOUNTER — Emergency Department (HOSPITAL_COMMUNITY)
Admission: EM | Admit: 2020-08-29 | Discharge: 2020-08-30 | Disposition: A | Discharge status: Home / Self Care | Payer: Medicare Other | Attending: Emergency Medicine | Admitting: Emergency Medicine

## 2020-08-29 DIAGNOSIS — I10 Essential (primary) hypertension: Secondary | ICD-10-CM | POA: Diagnosis not present

## 2020-08-29 DIAGNOSIS — Z1231 Encounter for screening mammogram for malignant neoplasm of breast: Secondary | ICD-10-CM | POA: Insufficient documentation

## 2020-08-29 DIAGNOSIS — Z79899 Other long term (current) drug therapy: Secondary | ICD-10-CM | POA: Insufficient documentation

## 2020-08-29 DIAGNOSIS — Z7982 Long term (current) use of aspirin: Secondary | ICD-10-CM | POA: Insufficient documentation

## 2020-08-29 DIAGNOSIS — I25110 Atherosclerotic heart disease of native coronary artery with unstable angina pectoris: Secondary | ICD-10-CM | POA: Diagnosis not present

## 2020-08-29 DIAGNOSIS — Z8616 Personal history of COVID-19: Secondary | ICD-10-CM | POA: Diagnosis not present

## 2020-08-29 DIAGNOSIS — K219 Gastro-esophageal reflux disease without esophagitis: Secondary | ICD-10-CM | POA: Insufficient documentation

## 2020-08-29 DIAGNOSIS — E039 Hypothyroidism, unspecified: Secondary | ICD-10-CM | POA: Insufficient documentation

## 2020-08-29 DIAGNOSIS — Z9049 Acquired absence of other specified parts of digestive tract: Secondary | ICD-10-CM | POA: Insufficient documentation

## 2020-08-29 DIAGNOSIS — K439 Ventral hernia without obstruction or gangrene: Secondary | ICD-10-CM

## 2020-08-29 DIAGNOSIS — F17210 Nicotine dependence, cigarettes, uncomplicated: Secondary | ICD-10-CM | POA: Diagnosis not present

## 2020-08-29 DIAGNOSIS — R112 Nausea with vomiting, unspecified: Secondary | ICD-10-CM | POA: Diagnosis not present

## 2020-08-29 DIAGNOSIS — R109 Unspecified abdominal pain: Secondary | ICD-10-CM | POA: Diagnosis not present

## 2020-08-29 LAB — COMPREHENSIVE METABOLIC PANEL
ALT: 22 U/L (ref 0–44)
AST: 29 U/L (ref 15–41)
Albumin: 3.8 g/dL (ref 3.5–5.0)
Alkaline Phosphatase: 76 U/L (ref 38–126)
Anion gap: 11 (ref 5–15)
BUN: 15 mg/dL (ref 6–20)
CO2: 20 mmol/L — ABNORMAL LOW (ref 22–32)
Calcium: 8.8 mg/dL — ABNORMAL LOW (ref 8.9–10.3)
Chloride: 107 mmol/L (ref 98–111)
Creatinine, Ser: 0.85 mg/dL (ref 0.44–1.00)
GFR, Estimated: >60 mL/min (ref >60)
Glucose, Bld: 103 mg/dL — ABNORMAL HIGH (ref 70–99)
Potassium: 3.8 mmol/L (ref 3.5–5.1)
Sodium: 138 mmol/L (ref 135–145)
Total Bilirubin: 0.7 mg/dL (ref 0.3–1.2)
Total Protein: 7.5 g/dL (ref 6.5–8.1)

## 2020-08-29 LAB — CBC WITH DIFFERENTIAL/PLATELET
Abs Immature Granulocytes: 0.01 10*3/uL (ref 0.00–0.07)
Basophils Absolute: 0 10*3/uL (ref 0.0–0.1)
Basophils Relative: 1 %
Eosinophils Absolute: 0.1 10*3/uL (ref 0.0–0.5)
Eosinophils Relative: 2 %
HCT: 33.9 % — ABNORMAL LOW (ref 36.0–46.0)
Hemoglobin: 12 g/dL (ref 12.0–15.0)
Immature Granulocytes: 0 %
Lymphocytes Relative: 50 %
Lymphs Abs: 2.4 10*3/uL (ref 0.7–4.0)
MCH: 27.3 pg (ref 26.0–34.0)
MCHC: 35.4 g/dL (ref 30.0–36.0)
MCV: 77.2 fL — ABNORMAL LOW (ref 80.0–100.0)
Monocytes Absolute: 0.6 10*3/uL (ref 0.1–1.0)
Monocytes Relative: 12 %
Neutro Abs: 1.7 10*3/uL (ref 1.7–7.7)
Neutrophils Relative %: 35 %
Platelets: 436 10*3/uL — ABNORMAL HIGH (ref 150–400)
RBC: 4.39 MIL/uL (ref 3.87–5.11)
RDW: 16.5 % — ABNORMAL HIGH (ref 11.5–15.5)
WBC: 4.8 10*3/uL (ref 4.0–10.5)
nRBC: 0 % (ref 0.0–0.2)

## 2020-08-29 MED ORDER — SODIUM CHLORIDE 0.9 % IV BOLUS
500.0000 mL | Freq: Once | INTRAVENOUS | Status: AC
  Administered 2020-08-29: 500 mL via INTRAVENOUS

## 2020-08-29 MED ORDER — ONDANSETRON HCL 4 MG/2ML IJ SOLN
4.0000 mg | Freq: Once | INTRAMUSCULAR | Status: AC
  Administered 2020-08-29: 4 mg via INTRAVENOUS
  Filled 2020-08-29: qty 2

## 2020-08-29 MED ORDER — FENTANYL CITRATE (PF) 100 MCG/2ML IJ SOLN
50.0000 ug | Freq: Once | INTRAMUSCULAR | Status: AC
  Administered 2020-08-29: 50 ug via INTRAVENOUS
  Filled 2020-08-29: qty 2

## 2020-08-29 NOTE — ED Provider Notes (Signed)
[facility] EMERGENCY DEPARTMENT Provider Note   CSN: [id number] Arrival date & time: 08/29/20  2102     History Chief Complaint  Patient presents with   Abdominal Pain    Angel Price is a 55 y.o. female.   Abdominal Pain Associated symptoms: diarrhea, nausea and vomiting   Associated symptoms: no chest pain, no dysuria and no shortness of breath   Patient presents abdominal pain.  Has had some nausea vomiting diarrhea.  Has had it for around 3 days.  He has a history of previous abdominal hernia with repair.  Complicated by mesh infection and explantation.  Has a known abdominal hernia but normally goes to left upper quadrant.  States she is recently had some swelling in the right lower quadrant however.  Angel Price was for a surgical repair after she quit smoking.  Patient states she is cut back but not quite yet.  For the last 3 days has had some diarrhea nausea vomiting.  Pain is in the lower abdomen.  States that bump will swell up more when she strains.    Past Medical History:  Diagnosis Date   Abscess    soft tissue   Adrenal mass ([place])    Alcohol abuse    Anxiety    Blood transfusion without reported diagnosis    Chronic abdominal pain    Chronic wound infection of abdomen    Colon polyp    colonoscopy 04/2014   Depression    Diverticulosis    colonoscopy 04/2014 moderat pan colonic   Drug-seeking behavior    Gastritis    EGD 05/2014   Gastroparesis Nov 2015   GERD (gastroesophageal reflux disease)    Hemorrhoid    internal large   Hiatal hernia    History of Billroth II operation    Hypertension    Lung nodule    CT 02/2014 needs repeat 1 month   Lung nodule < 6cm on CT 04/25/2014   Lupus ([place])    Malingering    Nausea and vomiting    chronic, recurrent   Pancreatitis    Schatzki's ring    patent per EGD 04/2014   Sickle cell trait ([place])    Suicide attempt ([place])    Thyroid disease 2000   overactive, radiation    Patient Active  Problem List   Diagnosis Date Noted   Mixed hyperlipidemia 04/27/2020   Pain in both lower extremities 12/29/2019   Coronary artery disease involving native coronary artery of native heart with unstable angina pectoris ([place]) 06/25/2019   Shortness of breath 06/15/2019   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   Acute respiratory failure with hypoxia ([place]) 05/20/2019   Essential hypertension 05/20/2019   COVID-19 05/20/2019   Syncope and collapse 05/20/2019   Right sided weakness 26/83/4196   Metabolic acidosis 22/29/7989   Intractable vomiting with nausea 02/23/2019   Abnormal CT scan, colon 12/09/2018   Abnormal computed tomography of cecum and terminal ileum 12/09/2018   Cocaine abuse ([place])    Colitis 11/11/2018   Hemorrhoidal skin tag    SBO (small bowel obstruction) (HCC)    Non-intractable vomiting with nausea    Folate deficiency 05/11/2017   Abdominal wall fistula    Cellulitis of abdominal wall 01/02/2017   Acute left hemiparesis ([place]) 12/05/2016   Malingering 12/05/2016   Weakness of left lower extremity 12/02/2016   CVA (cerebral vascular accident) ([place]) 11/23/2016   Bright red rectal bleeding 08/22/2016   Hypotension due to blood  loss    Acute GI bleeding 05/24/2016   Dieulafoy lesion of duodenum    History of Billroth II operation    Gastrointestinal hemorrhage 05/22/2016   Absolute anemia    Acute pancreatitis 10/01/2015   Abnormal CT scan of lung 10/01/2015   Alcohol abuse with intoxication ([place])    Left-sided weakness    Psychosomatic factor in physical condition    Upper GI bleed    Diverticulosis of colon with hemorrhage    Chronic wound infection of abdomen 12/22/2014   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   HTN (hypertension) 12/22/2014   Ataxia 11/01/2014   Hemorrhoids 04/26/2014   Lung nodule 04/26/2014   Diverticulosis    Gastritis    Hiatal hernia    Schatzki's ring    Acute blood loss anemia 04/25/2014   Sinus tachycardia 04/25/2014   Hypokalemia  04/25/2014   Hematemesis with nausea 04/25/2014   Lung nodule < 6cm on CT 04/25/2014   Intractable nausea and vomiting 04/24/2014   Gastroparesis    Abdominal pain 04/01/2014   Chronic abdominal pain 01/21/2014   Gastroenteritis 12/10/2013   Chronic abdominal wound infection 08/16/2013   MDD (major depressive disorder), recurrent episode, severe ([place]) 06/27/2013   Wrist laceration 06/24/2013   Diarrhea 05/07/2013   Rectal bleeding 05/07/2013   Abnormal LFTs 05/07/2013   Adrenal mass, left ([place]) 05/07/2013   Abdominal wall abscess at site of surgical wound 04/19/2013   Abdominal wall abscess 04/18/2013   Frequent headaches 03/30/2013   Sleep difficulties 03/30/2013   Essential hypertension, benign 03/30/2013   History of cocaine abuse ([place]) 03/16/2013   History of schizoaffective disorder 03/16/2013   Bipolar disorder ([place]) 03/16/2013   Personality disorder ([place]) 03/16/2013   Tobacco abuse 03/16/2013   Alcohol abuse 03/16/2013   Palpitations 03/16/2013   Poor vision 03/16/2013   History of gastric bypass 03/16/2013   Status post hysterectomy with oophorectomy 03/16/2013   Hypothyroid 03/16/2013   Lupus ([place]) 03/16/2013    Past Surgical History:  Procedure Laterality Date   ABDOMINAL HYSTERECTOMY  2013   [place]   ABDOMINAL HYSTERECTOMY     ABDOMINAL SURGERY     ADRENALECTOMY Right    AGILE CAPSULE N/A 01/05/2015   Procedure: AGILE CAPSULE;  Surgeon: Daneil Dolin, MD;  Location: [facility] END[facility];  Service: Endoscopy;  Laterality: N/A;  0700   Billroth II procedure      [place], first 2000, 2005/2006.   bilroth 2     BIOPSY  05/20/2013   Procedure: BIOPSIES OF ASCENDING AND SIGMOID COLON;  Surgeon: Daneil Dolin, MD;  Location: [facility] ORS;  Service: Endoscopy;;   BIOPSY  04/26/2014   Procedure: BIOPSIES;  Surgeon: Danie Binder, MD;  Location: [facility] ORS;  Service: Endoscopy;;   BIOPSY  12/24/2018   Procedure: BIOPSY;  Surgeon: Daneil Dolin, MD;  Location: [facility];   Service: Endoscopy;;  colon   CHOLECYSTECTOMY     COLONOSCOPY     in [place]   COLONOSCOPY WITH PROPOFOL N/A 05/20/2013   Dr.Rourk- inadequate prep, normal appearing rectum, grossly normal colon aside from pancolonic diverticula, normal terminal ileum bx= unremarkable colonic mucosa. Due for early interval 2016.    COLONOSCOPY WITH PROPOFOL N/A 04/26/2014   ZOX:WRUEAV ileum/one colon polyp removed/moderate pan-colonic diverticulosis/large internal hemorrhoids   COLONOSCOPY WITH PROPOFOL N/A 12/23/2014   Dr.Rourk- minimal internal hemorrhoids, pancolonic diverticulosis   COLONOSCOPY WITH PROPOFOL N/A 08/23/2016   Procedure: COLONOSCOPY WITH PROPOFOL;  Surgeon: Danie Binder, MD;  Location: [facility]  END[facility] SU[facility];  Service: Endoscopy;  Laterality: N/A;   COLONOSCOPY WITH PROPOFOL N/A 12/24/2018   Pancolonic diverticulosis, normal TI, one 5 mm polyp in rectum (tubular adenoma). Somewhat friable hemorrhagic mucosa in area of IC valve/cecum, possibly related to scope trauma s/p biopsy.    DEBRIDEMENT OF ABDOMINAL WALL ABSCESS N/A 02/08/2013   Procedure: DEBRIDEMENT OF ABDOMINAL WALL ABSCESS;  Surgeon: Jamesetta So, MD;  Location: [facility] OR[facility];  Service: General;  Laterality: N/A;   ESOPHAGOGASTRODUODENOSCOPY (EGD) WITH PROPOFOL N/A 05/20/2013   Dr.Rourk- s/p prior gastric surgery with normal esophagus, residual gastric mucosa and patent efferent limb   ESOPHAGOGASTRODUODENOSCOPY (EGD) WITH PROPOFOL N/A 02/03/2014   Dr. Gala Romney:  s/p hemigastrectomy with retained gastric contents. Residual gastric mucosa and efferent limb appeared normal otherwise. Query gastroparesis.    ESOPHAGOGASTRODUODENOSCOPY (EGD) WITH PROPOFOL N/A 04/26/2014   AOZ:HYQMVHQI'O ring/small HH/mild non-erosive gasrtitis/normal anastomosis   ESOPHAGOGASTRODUODENOSCOPY (EGD) WITH PROPOFOL N/A 12/23/2014   Dr.Rourk- s/p prior hemigastrctomy, active oozing from anastomotic suture site, hemostasis achieved   ESOPHAGOGASTRODUODENOSCOPY (EGD) WITH PROPOFOL N/A  05/23/2016   Dr. 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    ESOPHAGOGASTRODUODENOSCOPY (EGD) WITH PROPOFOL N/A 05/11/2017   Procedure: ESOPHAGOGASTRODUODENOSCOPY (EGD) WITH PROPOFOL;  Surgeon: Daneil Dolin, MD;  [facility];  Service: Endoscopy;  Laterality: N/A;   EXCISION OF MESH  08/2019   HEMORRHOID SURGERY N/A 06/18/2017   Procedure: THREE COLUMN EXTENSIVE HEMORRHOIDECTOMY;  Surgeon: Virl Cagey, MD;  Location: [facility];  Service: General;  Laterality: N/A;   INCISION AND DRAINAGE ABSCESS N/A 01/06/2017   Procedure: INCISION AND DRAINAGE ABDOMINAL WALL ABSCESS;  Surgeon: Aviva Signs, MD;  Location: [facility];  Service: General;  Laterality: N/A;   LEFT HEART CATH AND CORONARY ANGIOGRAPHY N/A 05/09/2020   Procedure: LEFT HEART CATH AND CORONARY ANGIOGRAPHY;  Surgeon: Nigel Mormon, MD;  Location: [facility];  Service: Cardiovascular;  Laterality: N/A;   POLYPECTOMY  12/24/2018   Procedure: POLYPECTOMY;  Surgeon: Daneil Dolin, MD;  Location: [facility];  Service: Endoscopy;;  colon   tendon repar Right    wrist   WOUND EXPLORATION Right 06/24/2013   Procedure: exploration of traumatic wound right wrist;  Surgeon: Tennis Must, MD;  Location: [facility];  Service: Orthopedics;  Laterality: Right;     OB History     Gravida  4   Para  4   Term  4   Preterm  0   AB  0   Living         SAB  0   IAB  0   Ectopic  0   Multiple      Live Births              Family History  Problem Relation Age of Onset   Lung cancer Father    Cancer Father        mets   Brain cancer Son    Schizophrenia Son    Cancer Son        brain   Drug abuse Mother    Breast cancer Maternal Aunt    Bipolar disorder Maternal Aunt    Drug abuse Maternal Aunt    Colon cancer Maternal Grandmother        late 13s, early 35s   Drug abuse Sister    Drug abuse Brother     Bipolar disorder Paternal Merchant navy officer  Bipolar disorder Cousin    Liver disease Neg Hx     Social History   Tobacco Use   Smoking status: Some Days    Packs/day: 0.25    Years: 35.00    Pack years: 8.75    Types: Cigarettes   Smokeless tobacco: Never  Vaping Use   Vaping Use: Never used  Substance Use Topics   Alcohol use: Not Currently   Drug use: Not Currently    Types: Cocaine    Home Medications Prior to Admission medications   Medication Sig Start Date End Date Taking? Authorizing Provider  acetaminophen (TYLENOL) 500 MG tablet Take 2 tablets (1,000 mg total) by mouth 3 (three) times daily as needed for mild pain or moderate pain. 02/26/19   Enzo Bi, MD  amLODipine (NORVASC) 10 MG tablet Take 1 tablet (10 mg total) by mouth daily. 06/16/19   Pokhrel, Corrie Mckusick, MD  aspirin EC 81 MG EC tablet Take 1 tablet (81 mg total) by mouth daily. 05/19/19   Elgergawy, Silver Huguenin, MD  atenolol (TENORMIN) 100 MG tablet Take 1 tablet (100 mg total) by mouth daily. 08/23/20   Patwardhan, Manish J, MD  escitalopram (LEXAPRO) 20 MG tablet Take 20 mg by mouth daily. 04/05/20   [provider]  isosorbide mononitrate (IMDUR) 60 MG 24 hr tablet Take 0.5 tablets (30 mg total) by mouth daily. 05/31/20 08/29/20  Patwardhan, Reynold Bowen, MD  mirtazapine (REMERON) 15 MG tablet Take 15 mg by mouth at bedtime. 04/22/20   [provider]  nitroGLYCERIN (NITROSTAT) 0.4 MG SL tablet Place 1 tablet (0.4 mg total) under the tongue every 5 (five) minutes as needed for chest pain. Patient taking differently: Place 0.4 mg under the tongue every 5 (five) minutes x 3 doses as needed for chest pain. 12/29/19 03/28/20  Patwardhan, Reynold Bowen, MD  pantoprazole (PROTONIX) 20 MG tablet Take 1 tablet (20 mg total) by mouth daily. 11/07/19   Couture, Cortni S, PA-C  promethazine (PHENERGAN) 12.5 MG tablet Take 12.5 mg by mouth every 6 (six) hours as needed for nausea or vomiting.  12/18/19   [provider]   rosuvastatin (CRESTOR) 20 MG tablet Take 1 tablet (20 mg total) by mouth daily. Patient taking differently: Take 20 mg by mouth at bedtime. 06/25/19 11/06/28  Patwardhan, Reynold Bowen, MD    Allergies    Metoclopramide and Reglan [metoclopramide]  Review of Systems   Review of Systems  Constitutional:  Positive for appetite change.  HENT:  Negative for congestion.   Respiratory:  Negative for shortness of breath.   Cardiovascular:  Negative for chest pain.  Gastrointestinal:  Positive for abdominal pain, diarrhea, nausea and vomiting. Negative for abdominal distention.  Genitourinary:  Negative for dysuria.  Musculoskeletal:  Negative for back pain.  Skin:  Negative for pallor.  Neurological:  Negative for weakness.  Psychiatric/Behavioral:  Negative for confusion.    Physical Exam Updated Vital Signs BP (!) 121/97 (BP Location: Right Arm)   Pulse (!) 105   Temp 98.4 F (36.9 C) (Oral)   Resp 18   Ht 5\' 3"  (1.6 m)   Wt 72.6 kg   LMP  (LMP Unknown)   SpO2 98%   BMI 28.34 kg/m   Physical Exam Vitals and nursing note reviewed.  HENT:     Head: Normocephalic.  Cardiovascular:     Rate and Rhythm: Normal rate.  Pulmonary:     Effort: Pulmonary effort is normal.  Abdominal:     Hernia: A  hernia is present. Hernia is present in the ventral area.     Comments: Multiple scars on abdomen from previous surgery.  Swelling right lower quadrant.  Hernia present but does reduce.  Mild tenderness diffusely.  More tenderness at the site of the hernia defect.  Skin:    General: Skin is warm.     Capillary Refill: Capillary refill takes less than 2 seconds.  Neurological:     Mental Status: She is alert and oriented to person, place, and time.    ED Results / Procedures / Treatments   Labs (all labs ordered are listed, but only abnormal results are displayed) Labs Reviewed  COMPREHENSIVE METABOLIC PANEL - Abnormal; Notable for the following components:      Result Value   CO2 20 (*)     Glucose, Bld 103 (*)    Calcium 8.8 (*)    All other components within normal limits  CBC WITH DIFFERENTIAL/PLATELET - Abnormal; Notable for the following components:   HCT 33.9 (*)    MCV 77.2 (*)    RDW 16.5 (*)    Platelets 436 (*)    All other components within normal limits  URINALYSIS, ROUTINE W REFLEX MICROSCOPIC    EKG None  Radiology DG Abdomen Acute W/Chest  Result Date: 08/29/2020 CLINICAL DATA:  Abdominal pain and hernia EXAM: DG ABDOMEN ACUTE WITH 1 VIEW CHEST COMPARISON:  11/10/2019 FINDINGS: There is no evidence of dilated bowel loops or free intraperitoneal air. No radiopaque calculi or other significant radiographic abnormality is seen. Heart size and mediastinal contours are within normal limits. Both lungs are clear. Right upper quadrant surgical clips noted. IMPRESSION: Negative abdominal radiographs. No acute cardiopulmonary disease. Electronically Signed   By: Ulyses Jarred M.D.   On: 08/29/2020 22:49    Procedures Procedures   Medications Ordered in ED Medications  sodium chloride 0.9 % bolus 500 mL (500 mLs Intravenous New Bag/Given 08/29/20 2317)  ondansetron (ZOFRAN) injection 4 mg (4 mg Intravenous Given 08/29/20 2317)  fentaNYL (SUBLIMAZE) injection 50 mcg (50 mcg Intravenous Given 08/29/20 2316)    ED Course  I have reviewed the triage vital signs and the nursing notes.  Pertinent labs & imaging results that were available during my care of the patient were reviewed by me and considered in my medical decision making (see chart for details).    MDM Rules/Calculators/A&P                          Patient with nausea vomiting diarrhea.  Right lower quadrant abdominal pain.  Known ventral hernias but appears if she may have a new area of herniation versus a different herniation of her previous known hernia.  Reduces in right lower quadrant.  Otherwise rather benign exam.  X-ray does not show obstruction.  Lab work overall reassuring.  We will give some  treatment and reevaluate.  Care turned over to Dr. Dina Rich. Final Clinical Impression(s) / ED Diagnoses Final diagnoses:  Ventral hernia without obstruction or gangrene    Rx / DC Orders ED Discharge Orders     None        Davonna Belling, MD 08/29/20 2328

## 2020-08-29 NOTE — ED Notes (Signed)
Pt. Ambulated to bathroom with steady gait.  

## 2020-08-29 NOTE — ED Notes (Signed)
Attempted iv twice without success.

## 2020-08-29 NOTE — ED Triage Notes (Signed)
Pt to er, pt states that she is here for abd pain, diarrhea and vomiting for the past three days.  Pt states that she has a significant history for abd surgery.

## 2020-08-29 NOTE — ED Notes (Signed)
Patient transported to X-ray 

## 2020-08-30 ENCOUNTER — Ambulatory Visit (HOSPITAL_COMMUNITY)
Admission: RE | Admit: 2020-08-30 | Discharge: 2020-08-30 | Disposition: A | Discharge status: Home / Self Care | Payer: Medicare Other | Source: Ambulatory Visit | Attending: Internal Medicine | Admitting: Internal Medicine

## 2020-08-30 DIAGNOSIS — Z1231 Encounter for screening mammogram for malignant neoplasm of breast: Secondary | ICD-10-CM

## 2020-08-30 MED ORDER — MORPHINE SULFATE (PF) 4 MG/ML IV SOLN
4.0000 mg | Freq: Once | INTRAVENOUS | Status: AC
  Administered 2020-08-30: 4 mg via INTRAVENOUS
  Filled 2020-08-30: qty 1

## 2020-08-30 MED ORDER — ONDANSETRON 4 MG PO TBDP: 4.0000 mg | ORAL_TABLET | Freq: Three times a day (TID) | ORAL | 0 refills | Status: DC | PRN

## 2020-08-30 NOTE — Discharge Instructions (Addendum)
You were seen today for your ongoing issues with hernias.  Follow-up with your general surgeon.

## 2020-08-30 NOTE — ED Provider Notes (Signed)
Patient signed out pending reassessment.  Patient given pain control.  Significant history of abdominal surgeries with recurrent ventral hernias.  Reducible on exam.  On my evaluation, pain is improved.  She reports some persistent nausea but is able to tolerate fluids.  Extensive abdominal scarring noted with reducible hernia, abdomen soft.  Encourage patient follow-up with her surgeon at [facility].   Physical Exam  BP 102/69   Pulse 94   Temp 98.4 F (36.9 C) (Oral)   Resp 18   Ht 1.6 m (5\' 3" )   Wt 72.6 kg   LMP  (LMP Unknown)   SpO2 96%   BMI 28.34 kg/m   Problem List Items Addressed This Visit   None Visit Diagnoses     Ventral hernia without obstruction or gangrene    -  Primary            Dina Rich Barbette Hair, MD 08/30/20 [phone]

## 2020-09-01 ENCOUNTER — Ambulatory Visit: Payer: Medicare Other | Admitting: Cardiology

## 2020-09-06 IMAGING — CR DG ABDOMEN ACUTE W/ 1V CHEST
3 series · 3 of 3 positions shown · non-contrast
Comparison: 07/02/2017, CT 02/23/2019, chest x-ray 11/11/2018

CLINICAL DATA: Abdomen pain with vomiting

EXAM:
DG ABDOMEN ACUTE W/ 1V CHEST

[w chest pa]
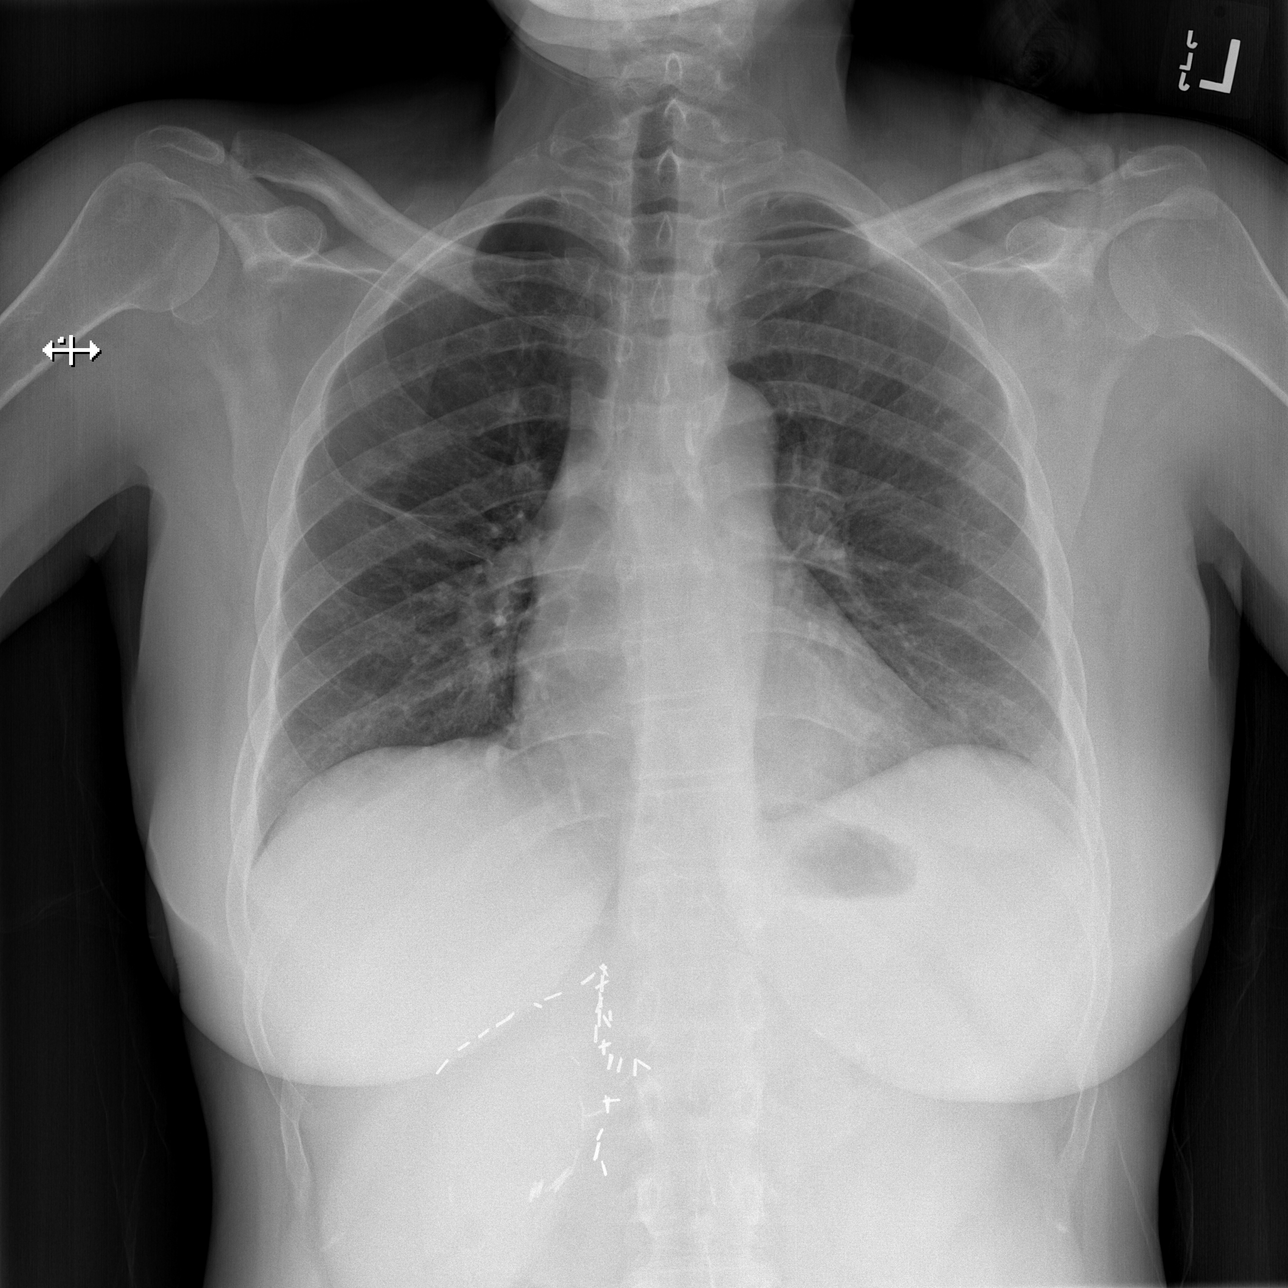

[w abdomen upright]
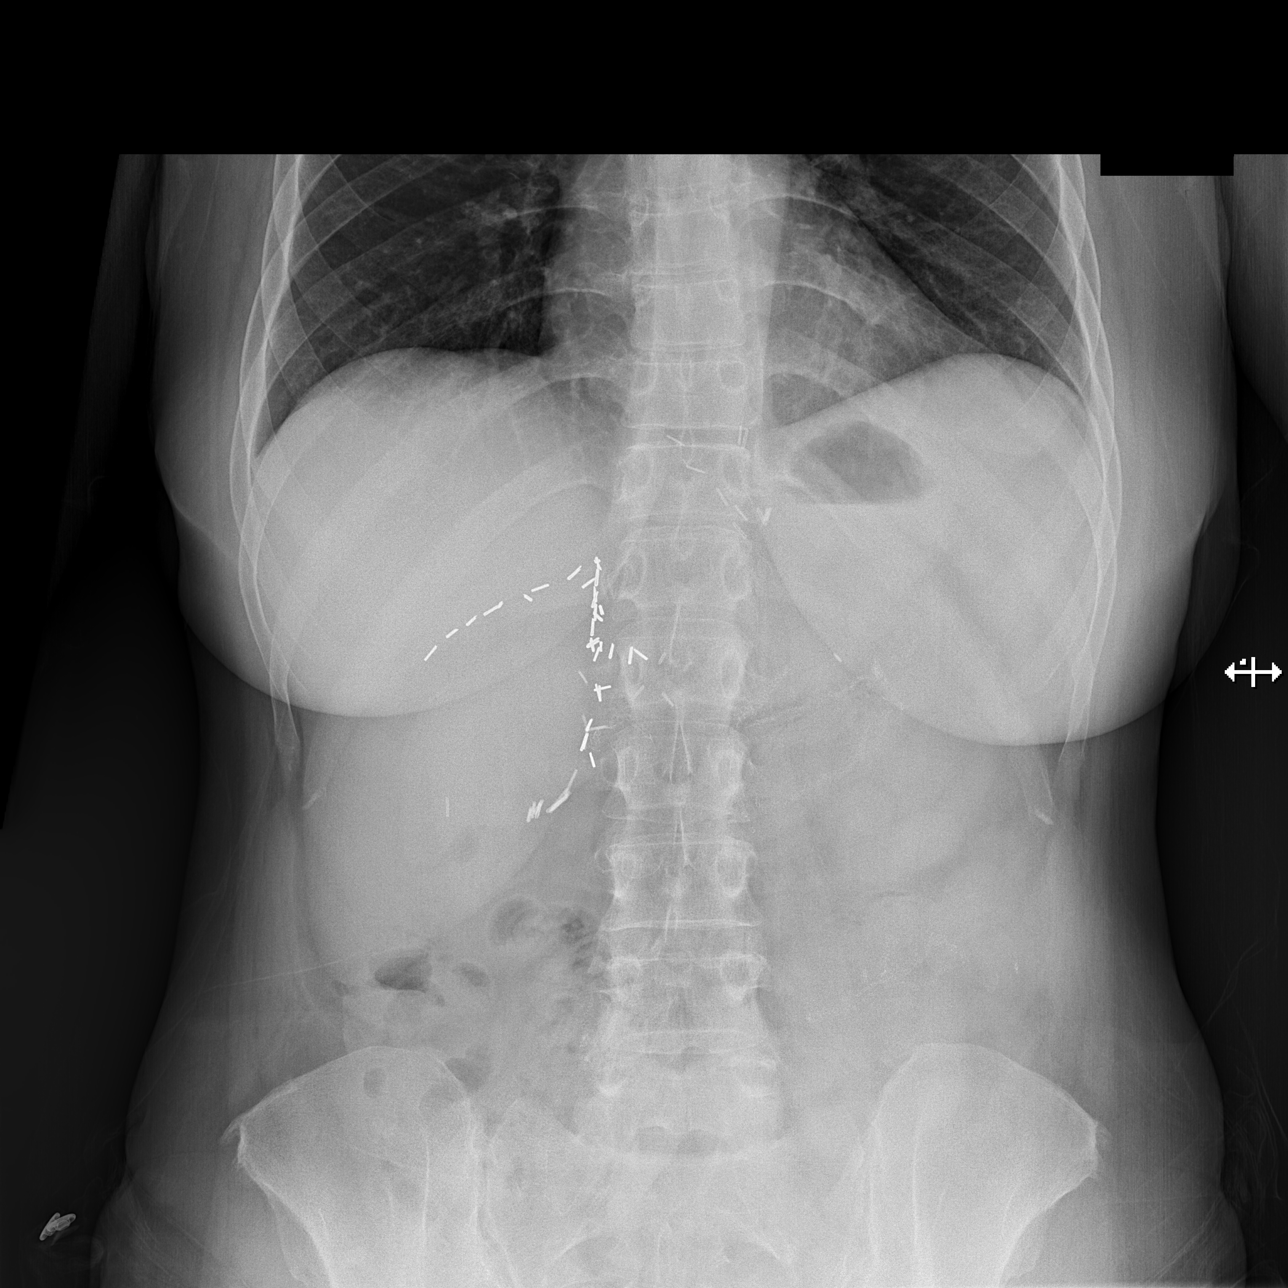

[t abdomen supine]
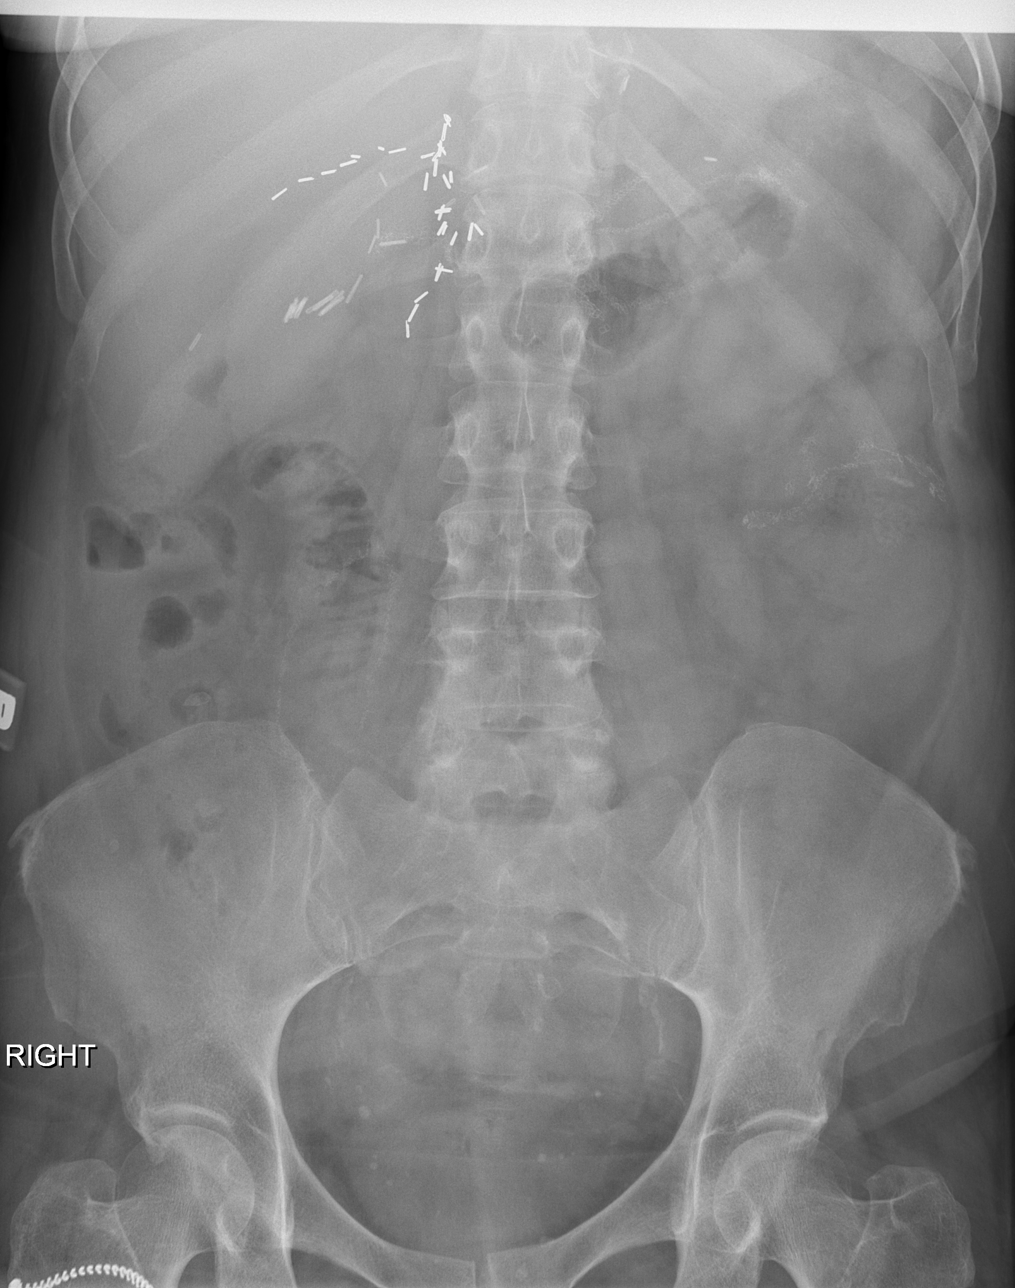

[3 of 3 positions shown; findings below may reference images not displayed]

FINDINGS: Single-view chest demonstrates possible subtle peripheral
ground-glass opacity. Normal heart size. No pneumothorax.

Supine and upright views of the abdomen demonstrate no free air
beneath the diaphragm. Postsurgical changes in the upper abdomen.
Nonobstructed gas pattern.

:
1. Nonobstructed gas pattern.
2. Possible subtle peripheral ground-glass opacities in the left mid
to upper lung, question atypical or viral process.

## 2020-09-12 DIAGNOSIS — F17210 Nicotine dependence, cigarettes, uncomplicated: Secondary | ICD-10-CM | POA: Insufficient documentation

## 2020-09-12 DIAGNOSIS — F17200 Nicotine dependence, unspecified, uncomplicated: Secondary | ICD-10-CM | POA: Insufficient documentation

## 2020-09-12 DIAGNOSIS — Z79899 Other long term (current) drug therapy: Secondary | ICD-10-CM | POA: Diagnosis not present

## 2020-09-13 IMAGING — MR MR HEAD W/O CM
11 series · 48 of 48 positions shown · non-contrast
Comparison: 12/20/2016

CLINICAL DATA: Headache and right upper extremity/lower extremity
weakness for 24 hours

EXAM:
MRI HEAD WITHOUT CONTRAST
TECHNIQUE: Multiplanar, multiecho pulse sequences of the brain and surrounding
structures were obtained without intravenous contrast.

[Series 5: T1 · sagittal · 5.0mm · 0.75mm/px · 3 of 24 slices shown (1 of 2)]
[im 1/24]
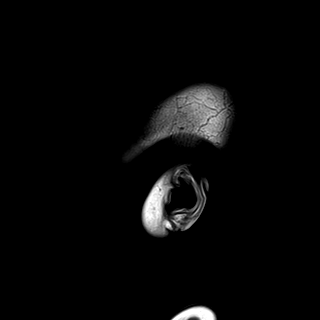
[im 12/24]
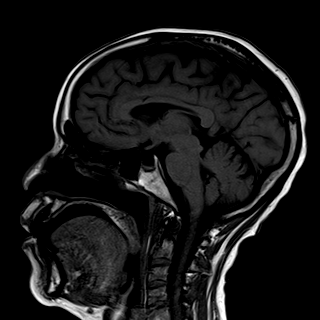
[im 24/24]
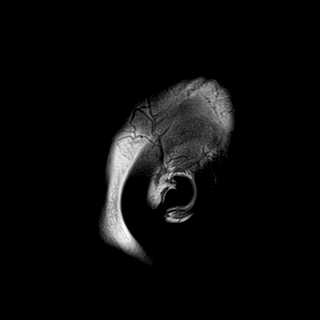

[Series 6: T2 · axial · 5.0mm · 0.62mm/px · z∈[-47,+113]mm · 2 of 26 slices shown (1 of 2)]
[im 1/26]
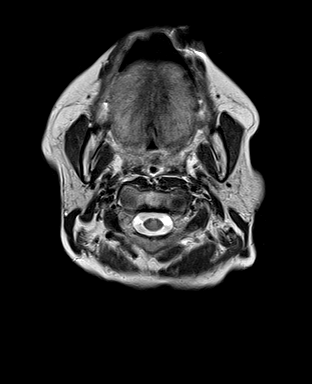
[im 26/26]
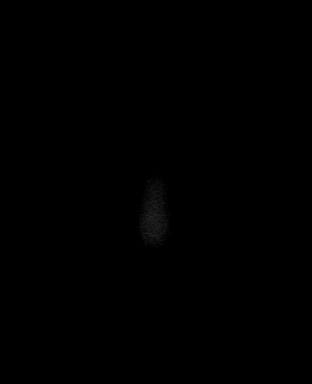

[Series 7: DWI · axial · 5.0mm · 1.36mm/px · z∈[-46,+115]mm · 4 of 51 slices shown (1 of 4)]
[im 1/51]
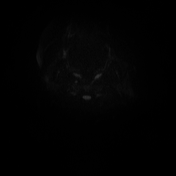
[im 17/51]
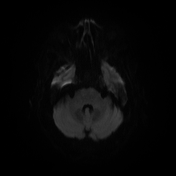
[im 34/51]
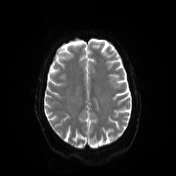
[im 51/51]
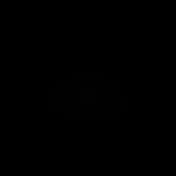

[Series 8: DWI · axial · 5.0mm · 1.36mm/px · z∈[-46,+108]mm · 2 of 25 slices shown (2 of 4)]
[im 1/25]
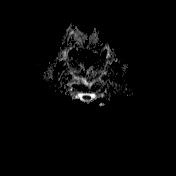
[im 25/25]
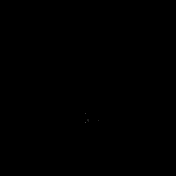

[Series 9: mip_images(sw) · axial · 24.0mm · 0.75mm/px · z∈[-37,+105]mm · 4 of 49 slices shown]
[im 1/49]
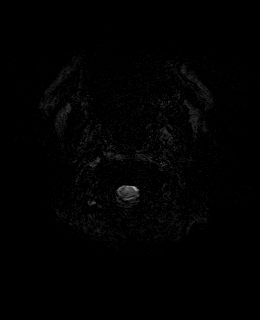
[im 17/49]
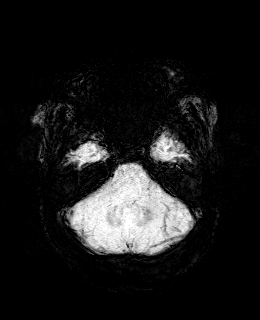
[im 33/49]
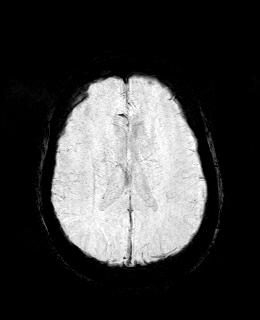
[im 49/49]
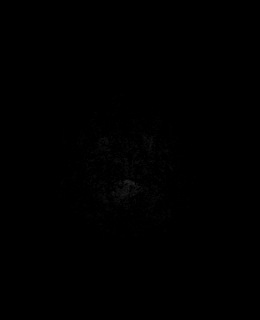

[Series 10: swi_images · axial · 3.0mm · 0.75mm/px · z∈[-47,+116]mm · 5 of 56 slices shown]
[im 1/56]
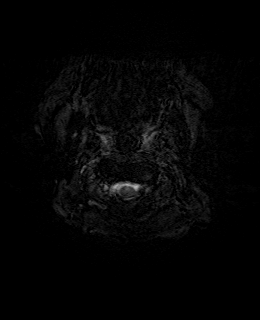
[im 14/56]
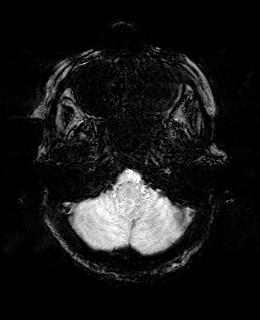
[im 28/56]
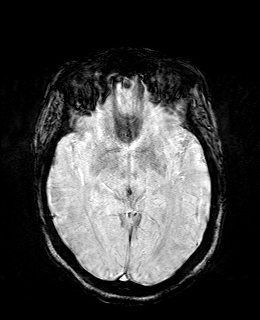
[im 42/56]
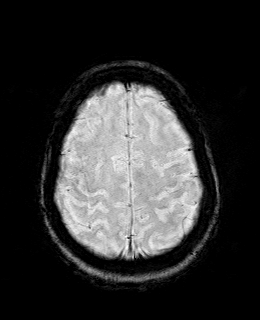
[im 56/56]
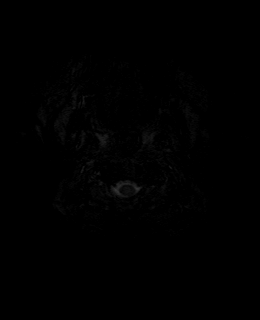

[Series 11: FLAIR · axial · 3.0mm · 0.75mm/px · z∈[-39,+106]mm · 4 of 50 slices shown]
[im 1/50]
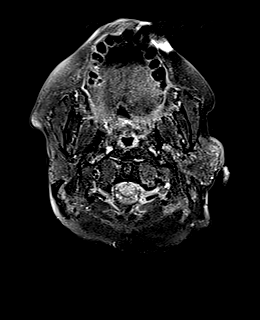
[im 17/50]
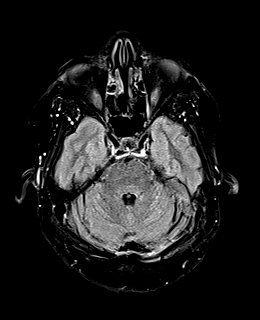
[im 33/50]
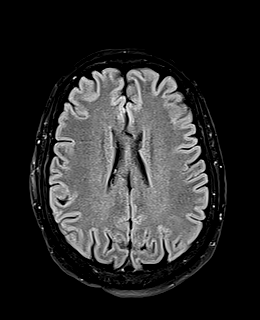
[im 50/50]
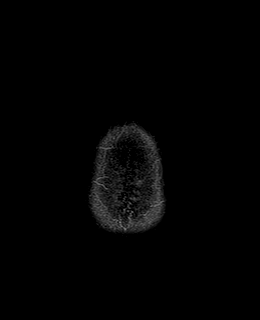

[Series 12: T1 · axial · 1.0mm · 0.94mm/px · z∈[-44,+113]mm · 13 of 160 slices shown (2 of 2)]
[im 1/160]
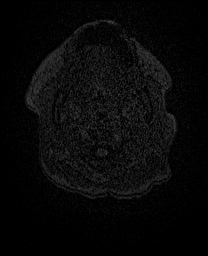
[im 14/160]
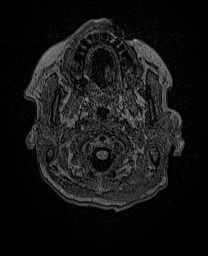
[im 27/160]
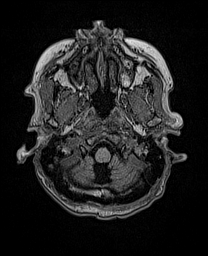
[im 40/160]
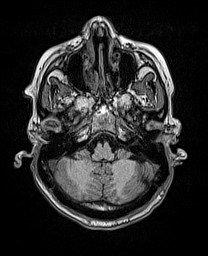
[im 54/160]
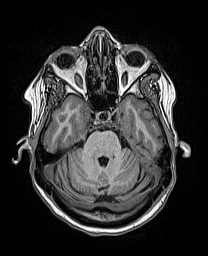
[im 67/160]
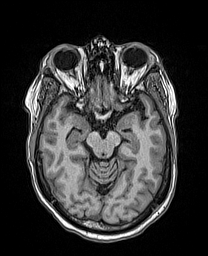
[im 80/160]
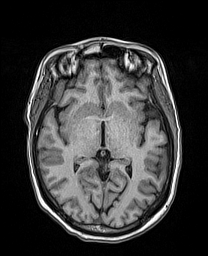
[im 93/160]
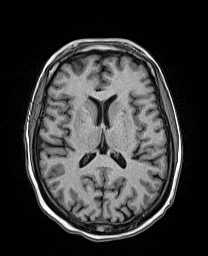
[im 107/160]
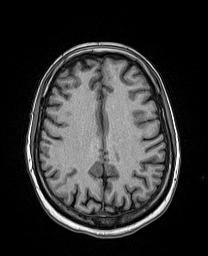
[im 120/160]
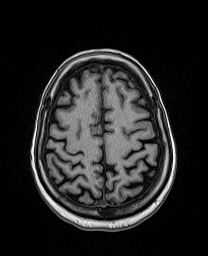
[im 133/160]
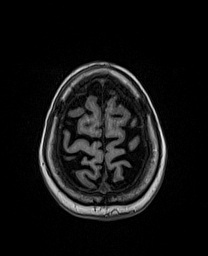
[im 146/160]
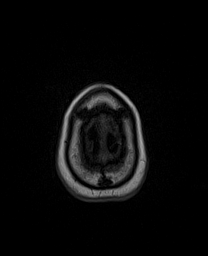
[im 160/160]
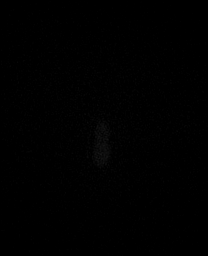

[Series 13: DWI · coronal · 5.0mm · 1.31mm/px · 5 of 64 slices shown (3 of 4)]
[im 1/64]
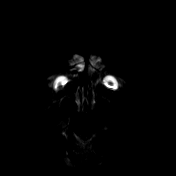
[im 16/64]
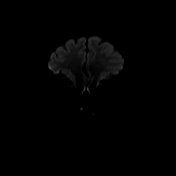
[im 32/64]
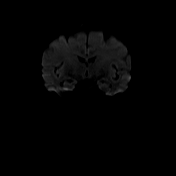
[im 48/64]
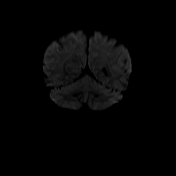
[im 64/64]
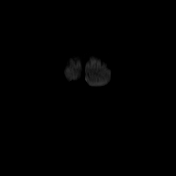

[Series 14: DWI · coronal · 5.0mm · 1.31mm/px · 3 of 32 slices shown (4 of 4)]
[im 1/32]
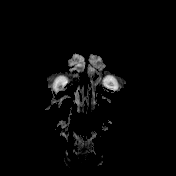
[im 16/32]
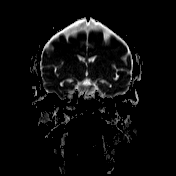
[im 32/32]
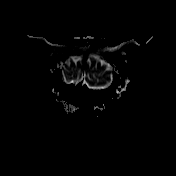

[Series 15: T2 · coronal · 5.0mm · 0.57mm/px · 3 of 32 slices shown (2 of 2)]
[im 1/32]
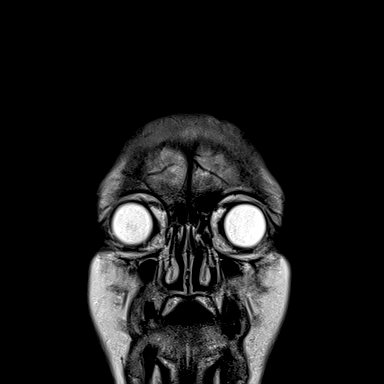
[im 16/32]
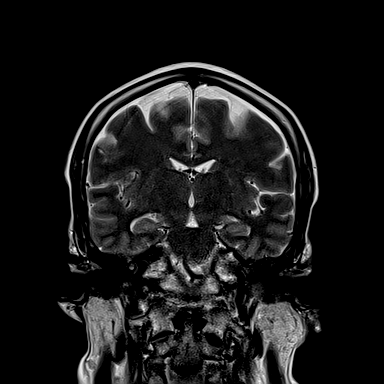
[im 32/32]
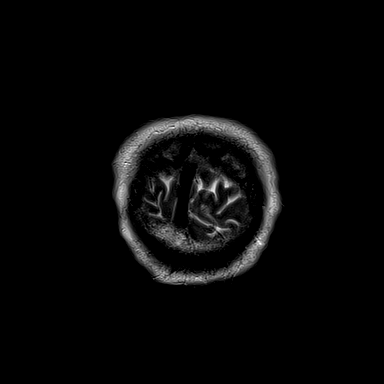

[48 of 48 positions shown; findings below may reference images not displayed]

FINDINGS: Brain: No acute infarction, hemorrhage, hydrocephalus, extra-axial
collection or mass lesion. Two tiny FLAIR hyperintensities in the
cerebral white matter, allowable for age and nonspecific. At least 1
of these was seen on prior. Brain volume is normal.

Vascular: Normal flow voids

Skull and upper cervical spine: Normal marrow signal

Sinuses/Orbits: Chronic nasal septum defect.
IMPRESSION: No emergent finding or significant change from 3766.

## 2020-09-20 IMAGING — CT CT ANGIO HEAD
3 of 7 series · 10 of 36 positions shown · IV contrast (OMNI 350)
Comparison: 12/20/2016

CLINICAL DATA: Right-sided weakness and aphasia.

EXAM:
CT ANGIOGRAPHY HEAD AND NECK
TECHNIQUE: Multidetector CT imaging of the head and neck was performed using
the standard protocol during bolus administration of intravenous
contrast. Multiplanar CT image reconstructions and MIPs were
obtained to evaluate the vascular anatomy. Carotid stenosis
measurements (when applicable) are obtained utilizing NASCET
criteria, using the distal internal carotid diameter as the
denominator.
CONTRAST:  100mL OMNIPAQUE IOHEXOL 350 MG/ML SOLN

[Series 3: cta neck · axial · 0.42mm/px · z∈[+227,+331]mm · 2 of 156 slices shown]
[im 52/156  soft-tissue]
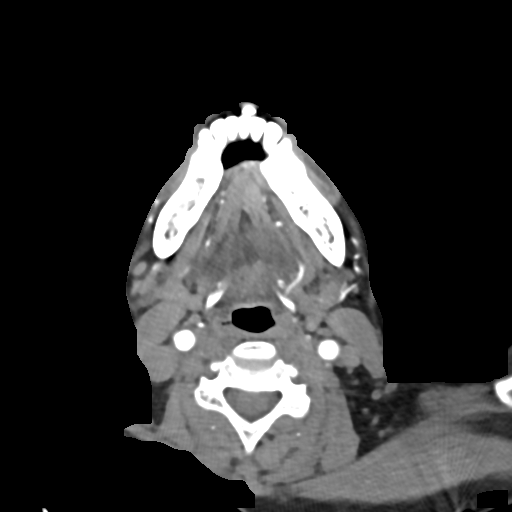
[im 104/156  soft-tissue]
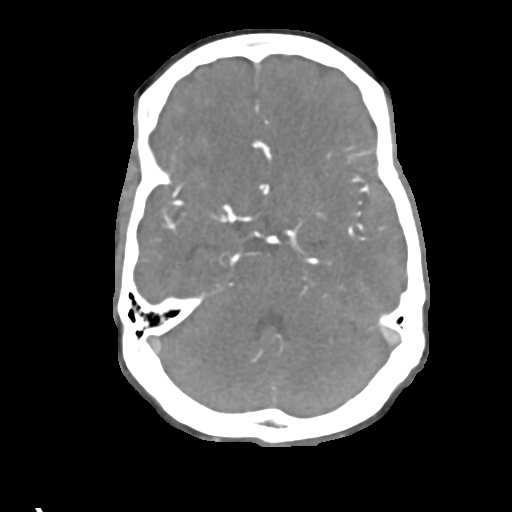

[Series 5: cta neck axial · axial · 0.39mm/px · z∈[+151,+370]mm · 6 of 321 slices shown]
[im 46/321  soft-tissue]
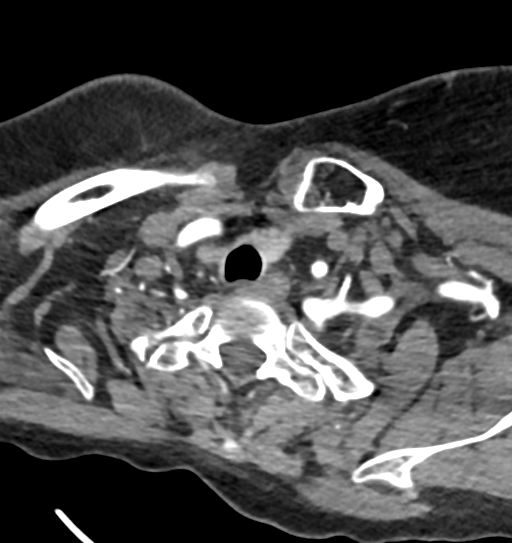
[im 92/321  bone]
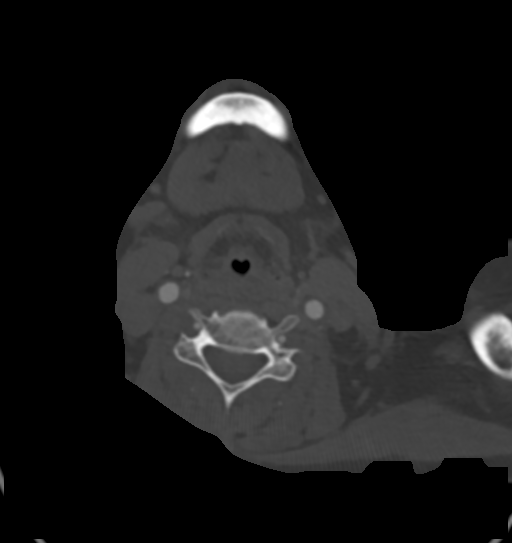
[im 138/321  soft-tissue]
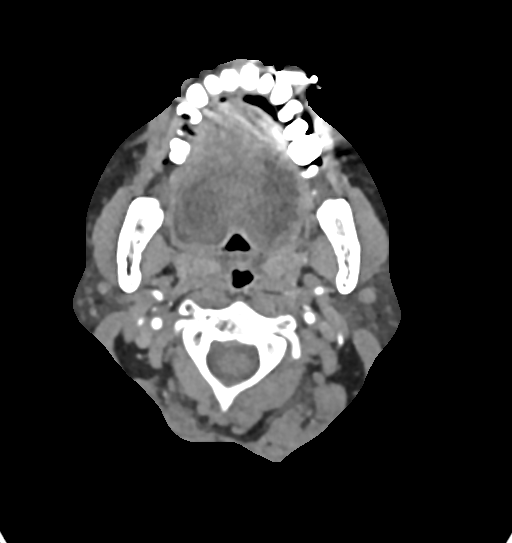
[im 183/321  bone]
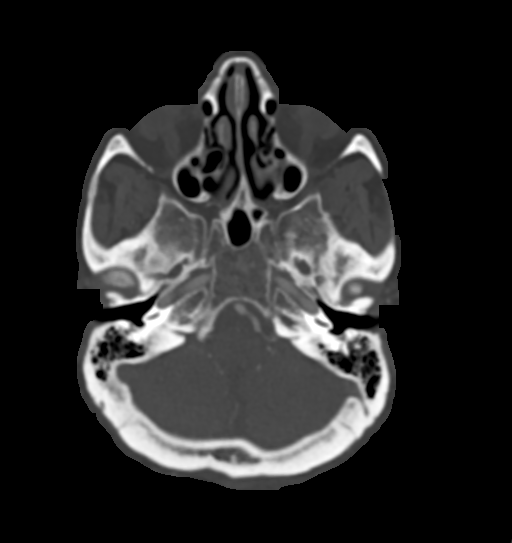
[im 229/321  soft-tissue]
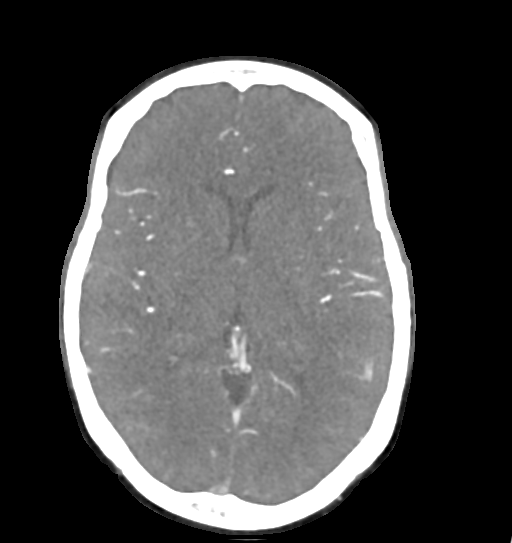
[im 275/321  bone]
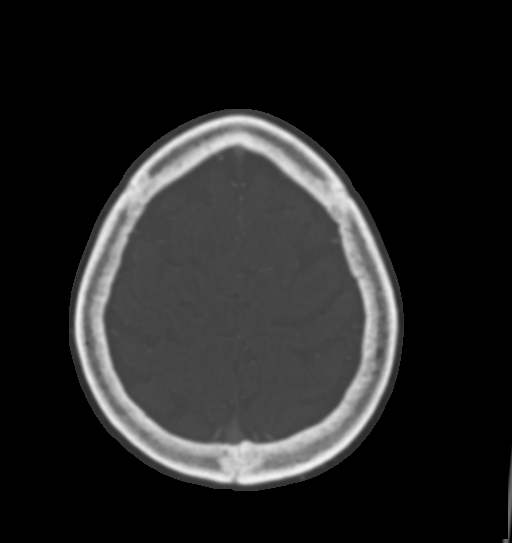

[Series 7: cta neck sagittal · sagittal · 0.43mm/px · 2 of 201 slices shown]
[im 22/201  soft-tissue]
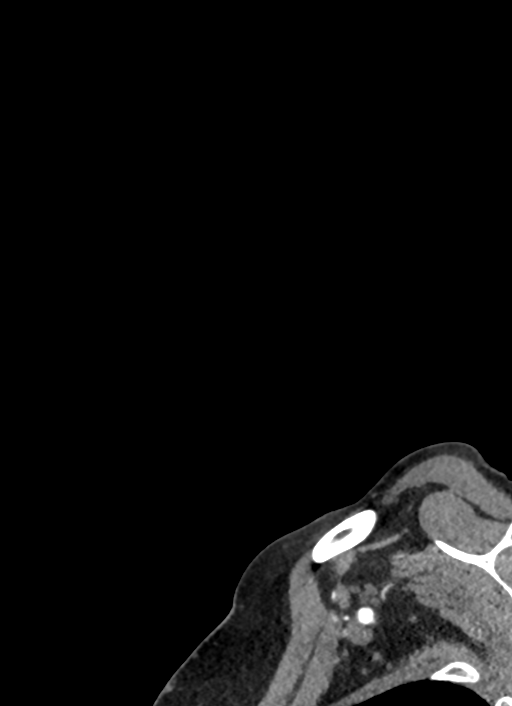
[im 180/201  soft-tissue]
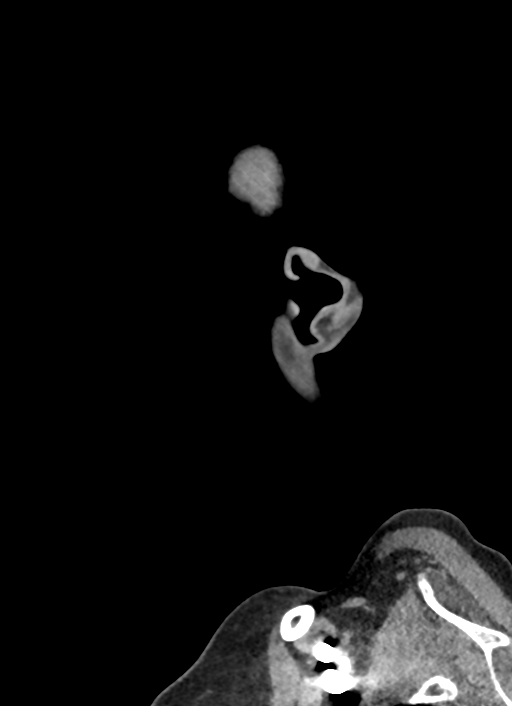

[10 of 36 positions shown; findings below may reference images not displayed]

FINDINGS: CTA NECK FINDINGS

Aortic arch: Normal variant aortic arch branching pattern with
common origin of the brachiocephalic and left common carotid
arteries. Minimal atherosclerotic calcification in the aortic arch.
Widely patent arch vessel origins.

Right carotid system: Patent and smooth without evidence of stenosis
or dissection.

Left carotid system: Patent and smooth without evidence of stenosis
or dissection.

Vertebral arteries: Patent, smooth, and codominant without evidence
of stenosis or dissection.

Skeleton: Moderate disc degeneration at C5-6.

Other neck: No evidence of cervical lymphadenopathy or mass.

Upper chest: Clear lung apices.

Review of the MIP images confirms the above findings

CTA HEAD FINDINGS

Anterior circulation: The internal carotid arteries are patent from
skull base to carotid termini with minimal nonstenotic plaque
bilaterally. ACAs and MCAs are patent without evidence of proximal
branch occlusion or significant proximal stenosis. No aneurysm is
identified.

Posterior circulation: The intracranial vertebral arteries are
widely patent to the basilar. Patent PICAs and SCAs are seen
bilaterally. The basilar artery is widely patent. There are right
larger than left posterior communicating arteries. Both PCAs are
patent without evidence of significant proximal stenosis. No
aneurysm is identified.

Venous sinuses: As permitted by contrast timing, patent.

Anatomic variants: None.

Review of the MIP images confirms the above findings
IMPRESSION: 1. Minimal atherosclerosis without large vessel occlusion or
significant stenosis in the head and neck.
2.  Aortic Atherosclerosis ([id number]).

These results were communicated to Dr. Francisco De Assis at [DATE] on
05/16/2019 by text page via the [employer] messaging system.

## 2020-09-20 IMAGING — CT CT ANGIO NECK
1 series · 1 of 1 positions shown · IV contrast (omnipaque)
Comparison: 12/20/2016

CLINICAL DATA: Right-sided weakness and aphasia.

EXAM:
CT ANGIOGRAPHY HEAD AND NECK
TECHNIQUE: Multidetector CT imaging of the head and neck was performed using
the standard protocol during bolus administration of intravenous
contrast. Multiplanar CT image reconstructions and MIPs were
obtained to evaluate the vascular anatomy. Carotid stenosis
measurements (when applicable) are obtained utilizing NASCET
criteria, using the distal internal carotid diameter as the
denominator.
CONTRAST:  100mL OMNIPAQUE IOHEXOL 350 MG/ML SOLN

[Series 2: topogram 0.6 t20f · coronal · 1.00mm/px · 1 of 1 slices shown]
[im 1/1]
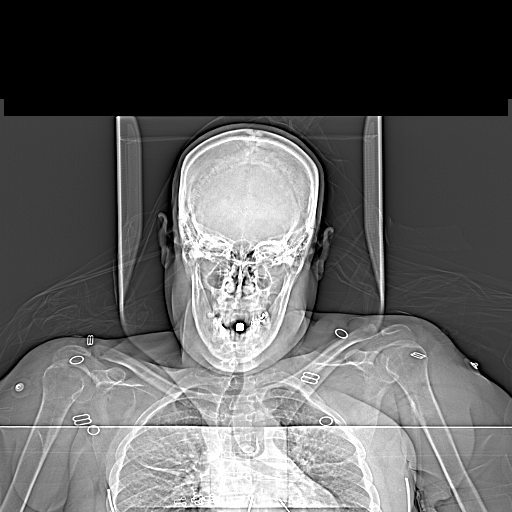

[1 of 1 positions shown; findings below may reference images not displayed]

FINDINGS: CTA NECK FINDINGS

Aortic arch: Normal variant aortic arch branching pattern with
common origin of the brachiocephalic and left common carotid
arteries. Minimal atherosclerotic calcification in the aortic arch.
Widely patent arch vessel origins.

Right carotid system: Patent and smooth without evidence of stenosis
or dissection.

Left carotid system: Patent and smooth without evidence of stenosis
or dissection.

Vertebral arteries: Patent, smooth, and codominant without evidence
of stenosis or dissection.

Skeleton: Moderate disc degeneration at C5-6.

Other neck: No evidence of cervical lymphadenopathy or mass.

Upper chest: Clear lung apices.

Review of the MIP images confirms the above findings

CTA HEAD FINDINGS

Anterior circulation: The internal carotid arteries are patent from
skull base to carotid termini with minimal nonstenotic plaque
bilaterally. ACAs and MCAs are patent without evidence of proximal
branch occlusion or significant proximal stenosis. No aneurysm is
identified.

Posterior circulation: The intracranial vertebral arteries are
widely patent to the basilar. Patent PICAs and SCAs are seen
bilaterally. The basilar artery is widely patent. There are right
larger than left posterior communicating arteries. Both PCAs are
patent without evidence of significant proximal stenosis. No
aneurysm is identified.

Venous sinuses: As permitted by contrast timing, patent.

Anatomic variants: None.

Review of the MIP images confirms the above findings
IMPRESSION: 1. Minimal atherosclerosis without large vessel occlusion or
significant stenosis in the head and neck.
2.  Aortic Atherosclerosis ([id number]).

These results were communicated to Dr. Francisco De Assis at [DATE] on
05/16/2019 by text page via the AMION messaging system.

## 2020-09-20 IMAGING — CT CT HEAD CODE STROKE
3 series · 16 of 47 positions shown, 19 images · non-contrast
Comparison: Head MRI 05/09/2019

CLINICAL DATA: Code stroke.  Right-sided weakness and aphasia.

EXAM:
CT HEAD WITHOUT CONTRAST
TECHNIQUE: Contiguous axial images were obtained from the base of the skull
through the vertex without intravenous contrast.

[Series 3: head 5.0 st · axial · 0.40mm/px · z∈[+284,+424]mm · 10 of 34 slices shown, 13 images]
[im 3/34  brain]
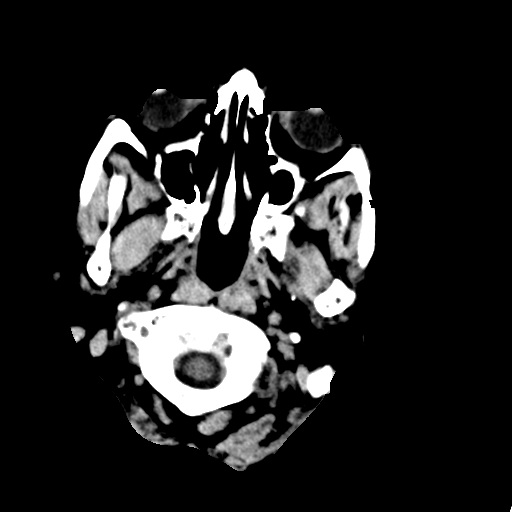
[im 3/34  bone]
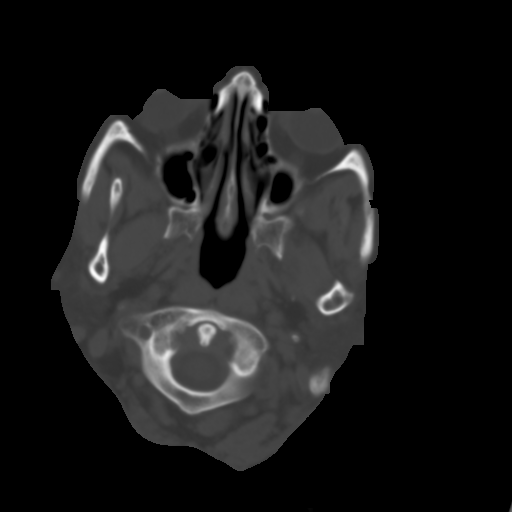
[im 6/34  brain]
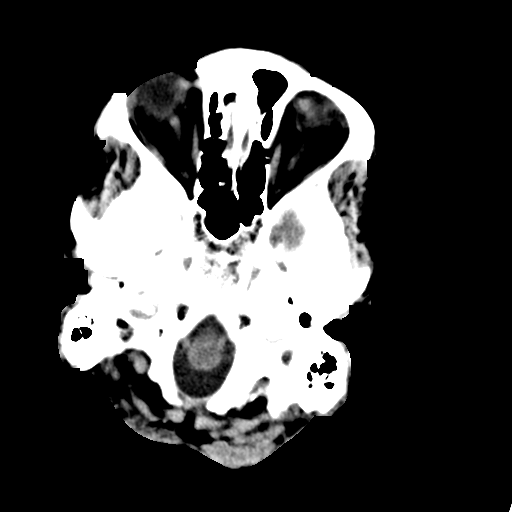
[im 10/34  brain]
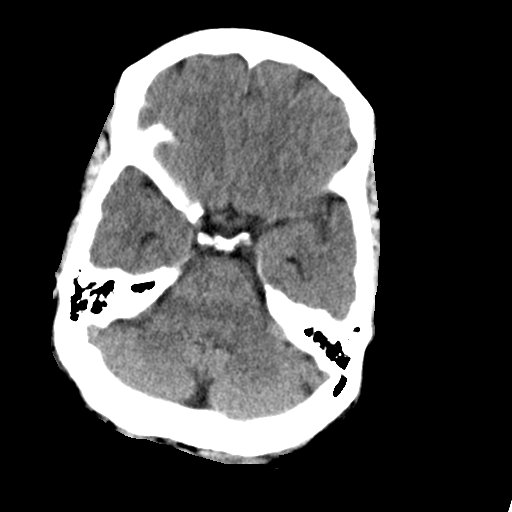
[im 12/34  brain]
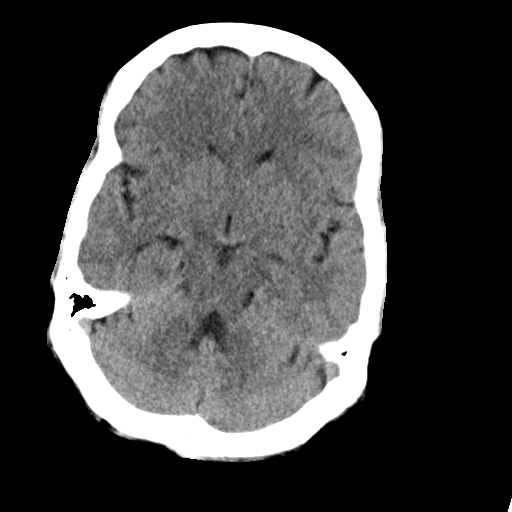
[im 15/34  brain]
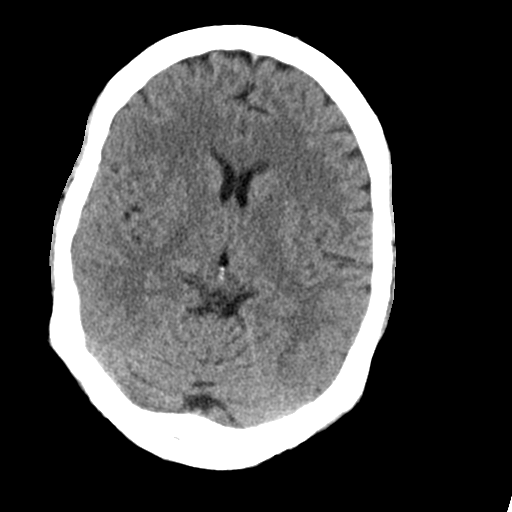
[im 15/34  bone]
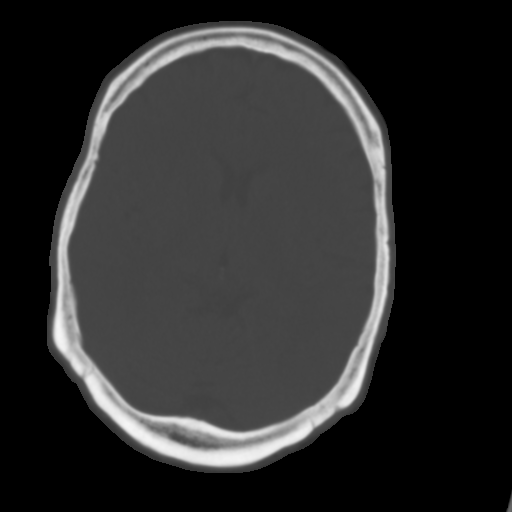
[im 19/34  brain]
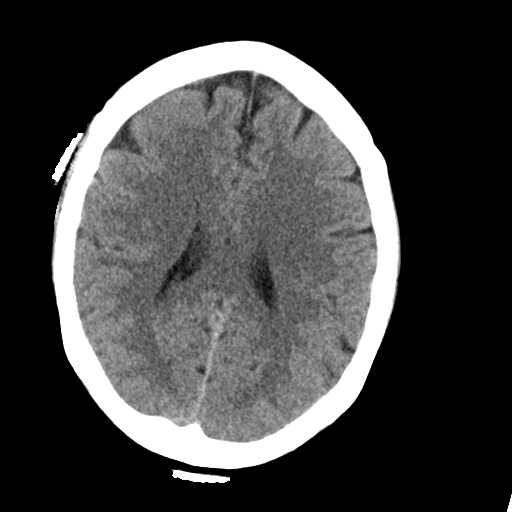
[im 22/34  brain]
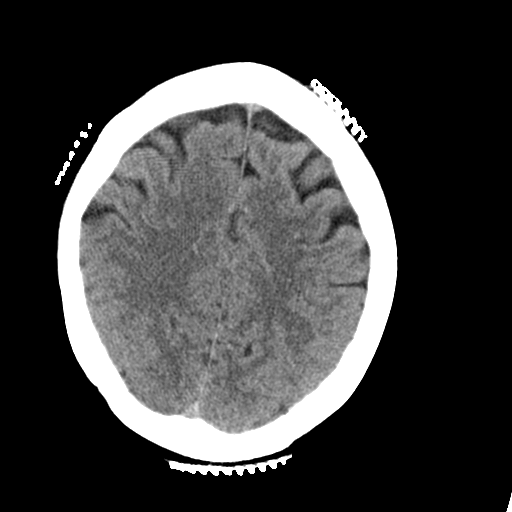
[im 26/34  brain]
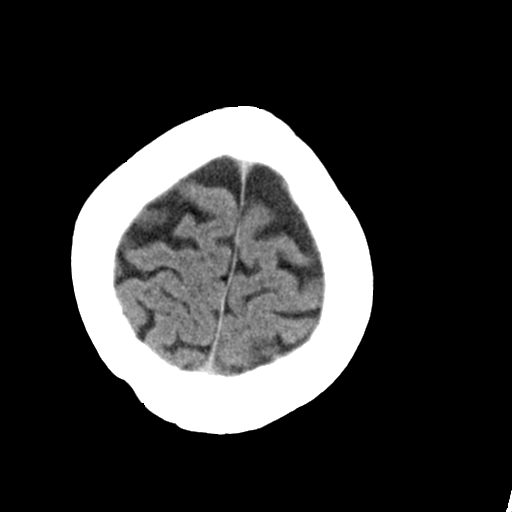
[im 28/34  brain]
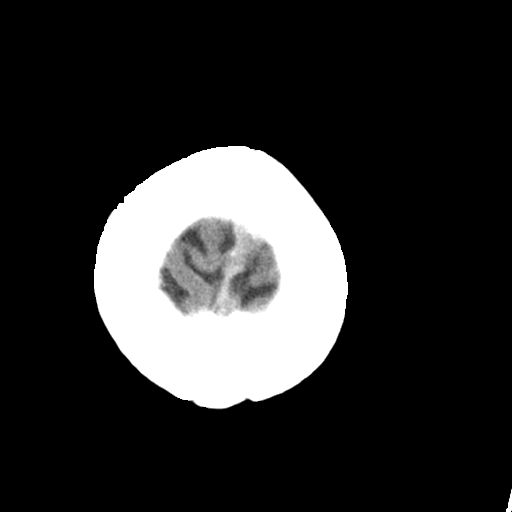
[im 28/34  bone]
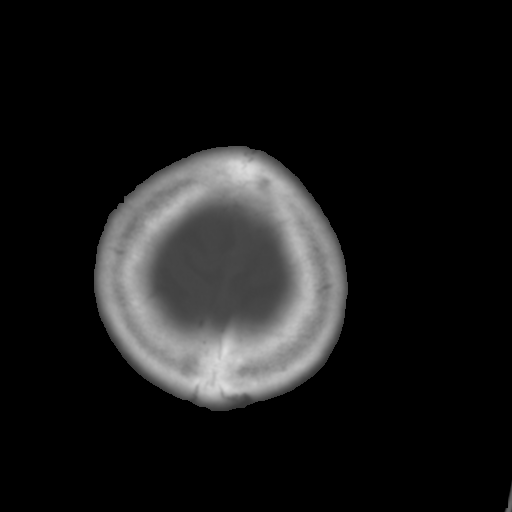
[im 31/34  brain]
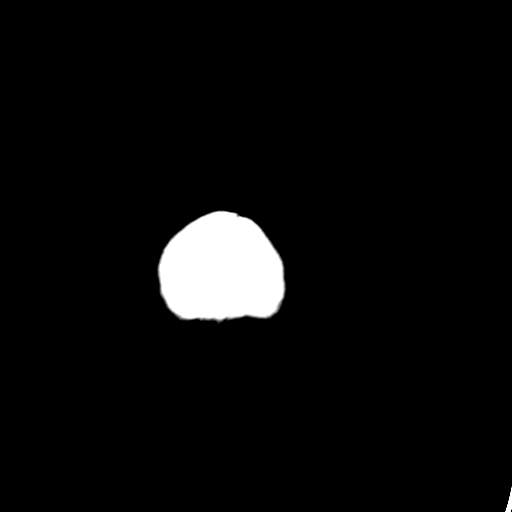

[Series 5: head 3.0 cor st · coronal · 0.33mm/px · 3 of 68 slices shown]
[im 25/68  brain]
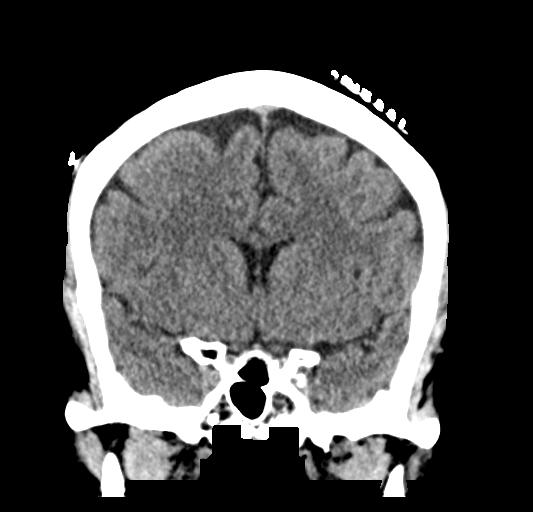
[im 31/68  brain]
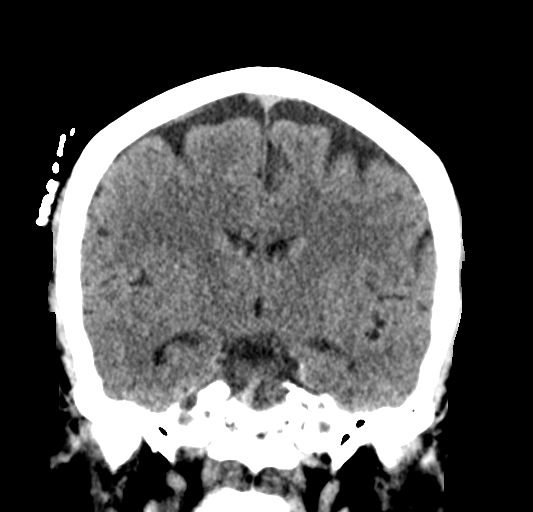
[im 37/68  brain]
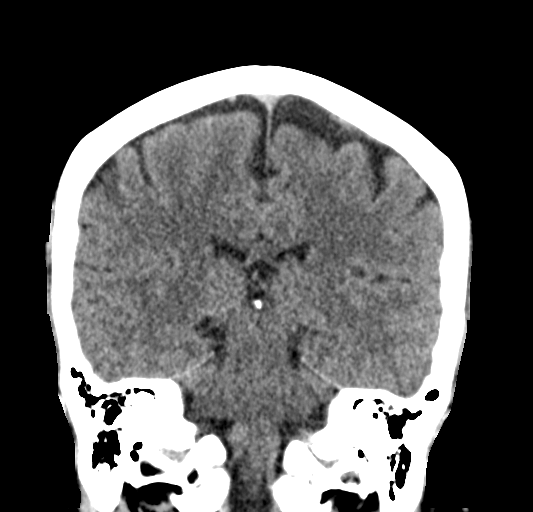

[Series 6: head 3.0 sag st · sagittal · 0.34mm/px · 3 of 57 slices shown]
[im 19/57  brain]
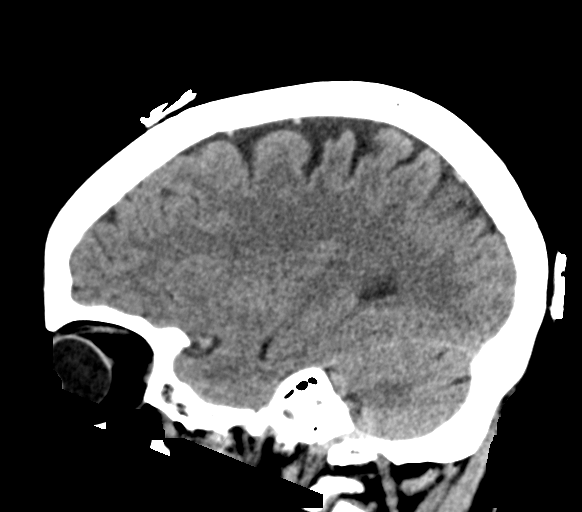
[im 29/57  brain]
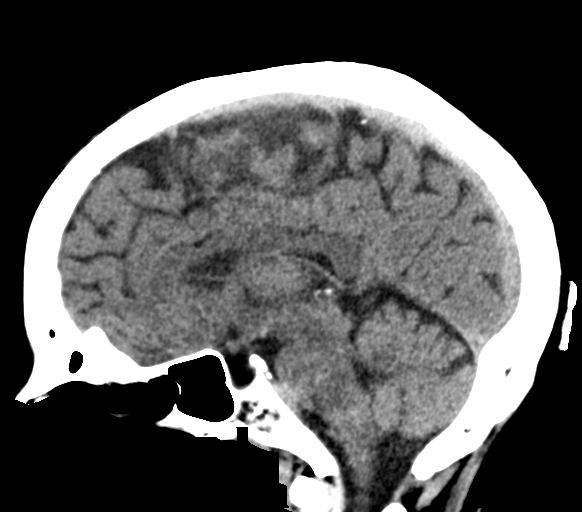
[im 38/57  brain]
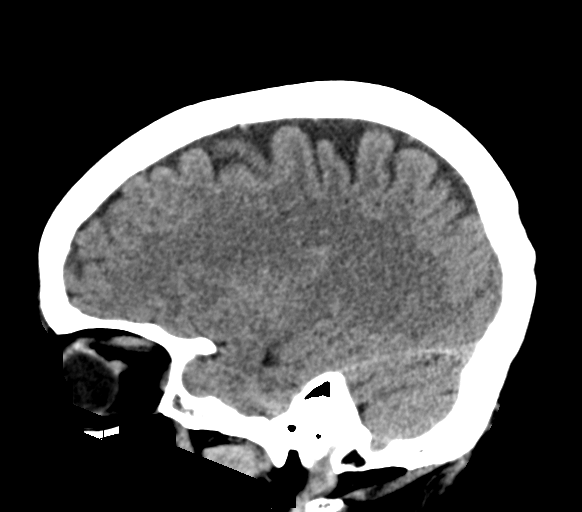

[16 of 47 positions shown; findings below may reference images not displayed]

FINDINGS: Brain: There is no evidence of acute infarct, intracranial
hemorrhage, mass, midline shift, or extra-axial fluid collection. A
partially empty sella is unchanged. The ventricles and sulci are
normal.

Vascular: Calcified atherosclerosis at the skull base. No hyperdense
vessel.

Skull: No fracture or suspicious osseous lesion.

Sinuses/Orbits: Visualized paranasal sinuses and mastoid air cells
are clear. Orbits are unremarkable.

Other: None.

ASPECTS (Alberta Stroke Program Early CT Score)

- Ganglionic level infarction (caudate, lentiform nuclei, internal
capsule, insula, M1-M3 cortex): 7

- Supraganglionic infarction (M4-M6 cortex): 3

Total score (0-10 with 10 being normal): 10
IMPRESSION: 1. No evidence of acute intracranial abnormality.
2. ASPECTS is 10.

These results were communicated to Dr. Coffman at [DATE] on
05/16/2019 by text page via the AMION messaging system.

## 2020-09-22 IMAGING — CT CT ANGIO CHEST
2 of 6 series · 18 of 36 positions shown · IV contrast (omnipaque)
Comparison: CT Abdomen and Pelvis 02/23/2019. Portable chest
11/11/2018.

CLINICAL DATA: 53-year-old female with chest pain and abnormal
D-dimer. Positive 3JJJQ-LU on 05/16/2019.

EXAM:
CT ANGIOGRAPHY CHEST WITH CONTRAST
TECHNIQUE: Multidetector CT imaging of the chest was performed using the
standard protocol during bolus administration of intravenous
contrast. Multiplanar CT image reconstructions and MIPs were
obtained to evaluate the vascular anatomy.
CONTRAST:  100mL OMNIPAQUE IOHEXOL 350 MG/ML SOLN

[Series 7: pe thins · axial · 0.74mm/px · z∈[-267,-57]mm · 17 of 334 slices shown]
[im 17/334  lung]
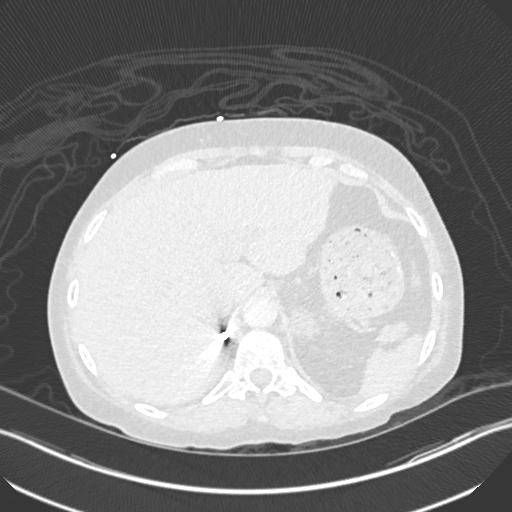
[im 34/334  mediastinal]
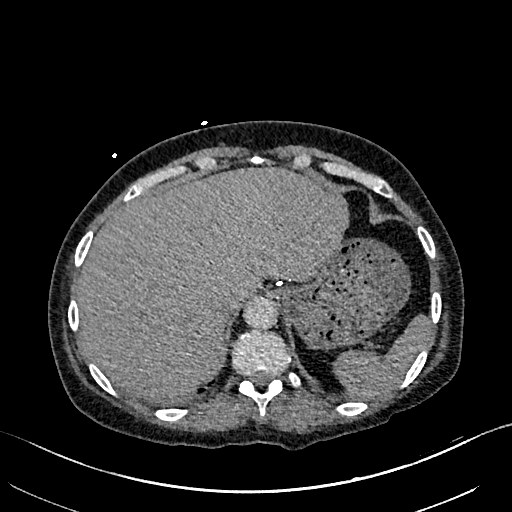
[im 50/334  lung]
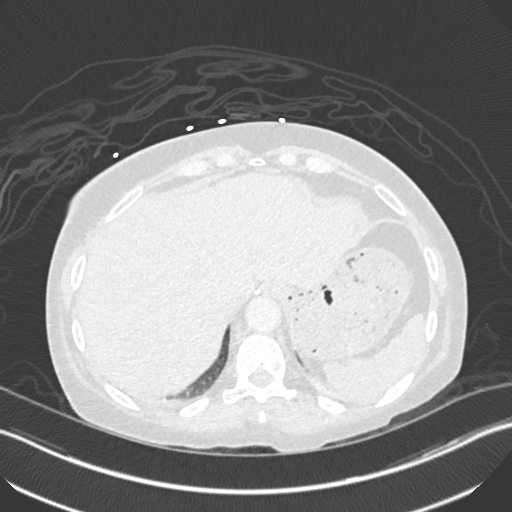
[im 67/334  mediastinal]
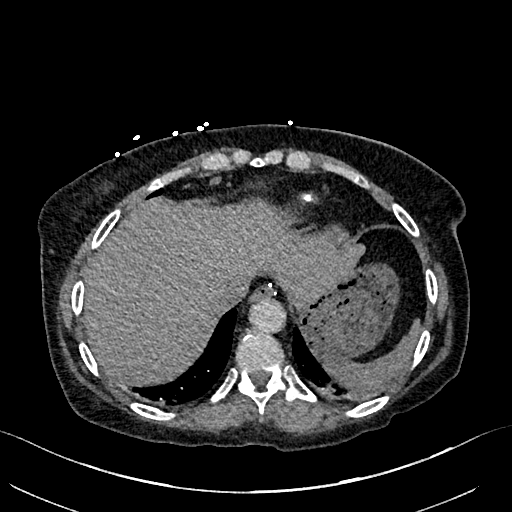
[im 100/334  lung]
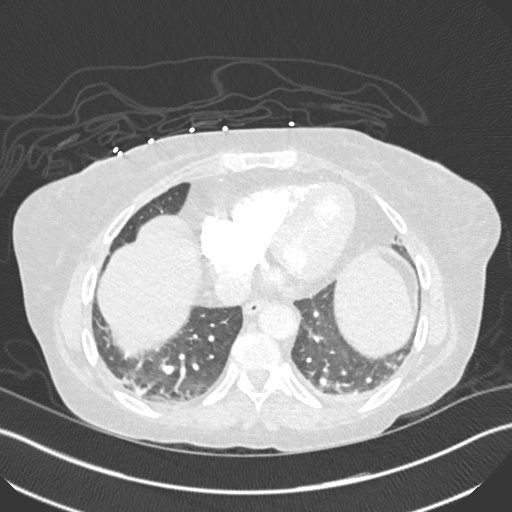
[im 117/334  mediastinal]
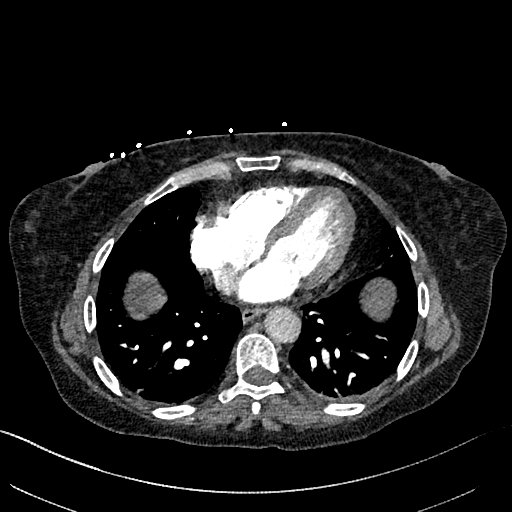
[im 134/334  lung]
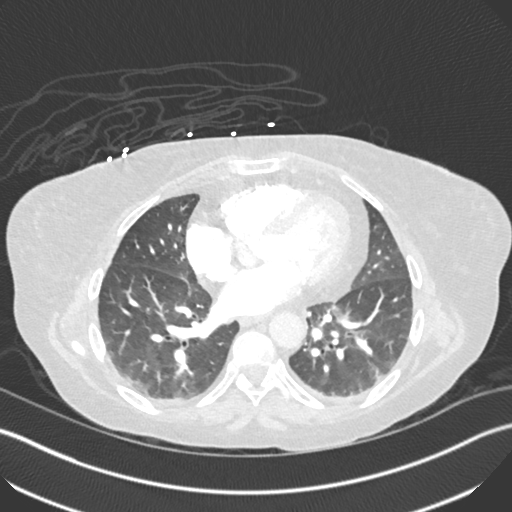
[im 150/334  mediastinal]
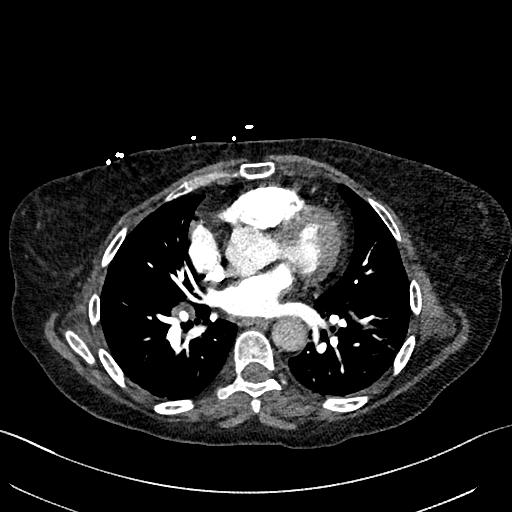
[im 167/334  lung]
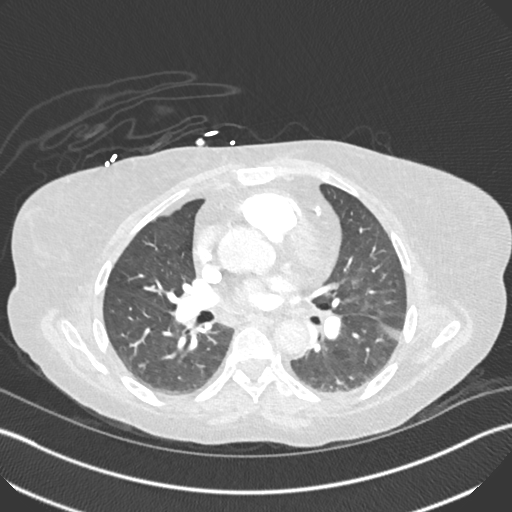
[im 184/334  mediastinal]
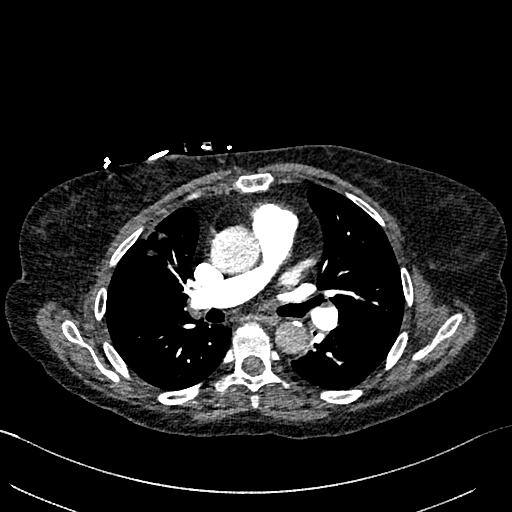
[im 200/334  lung]
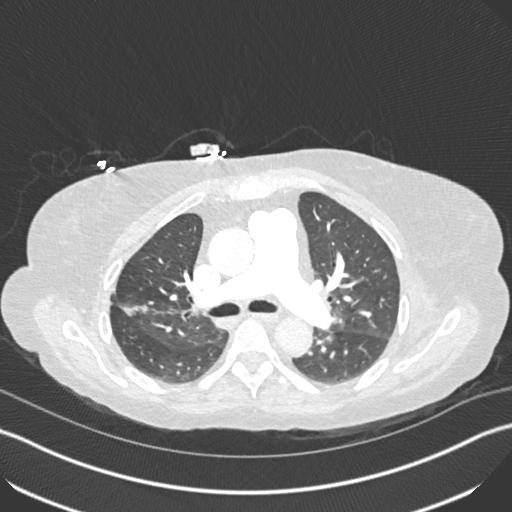
[im 217/334  mediastinal]
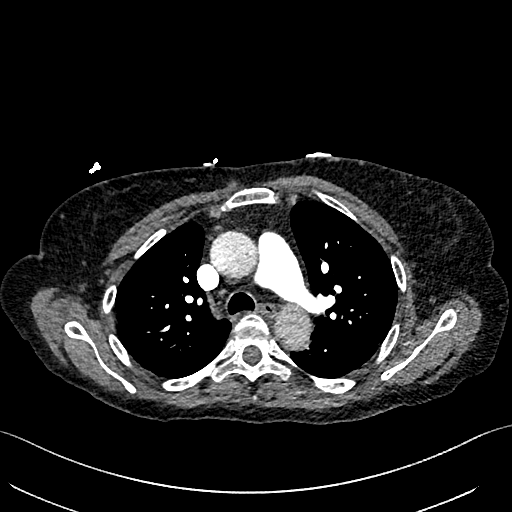
[im 234/334  lung]
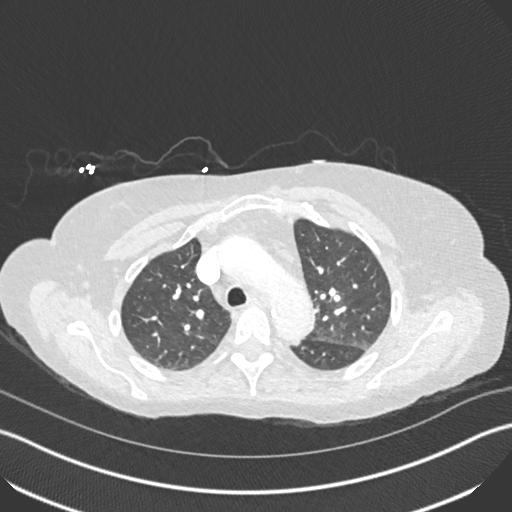
[im 267/334  mediastinal]
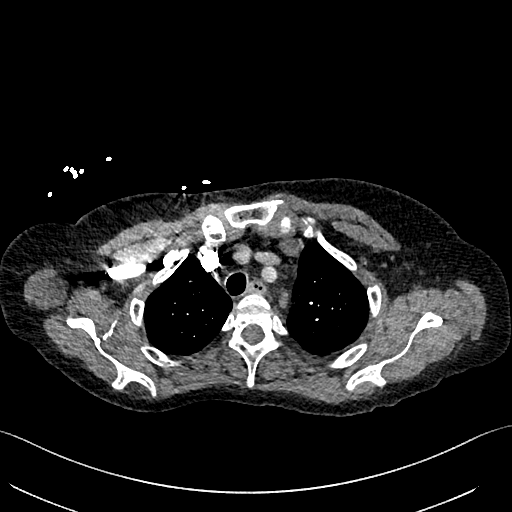
[im 284/334  lung]
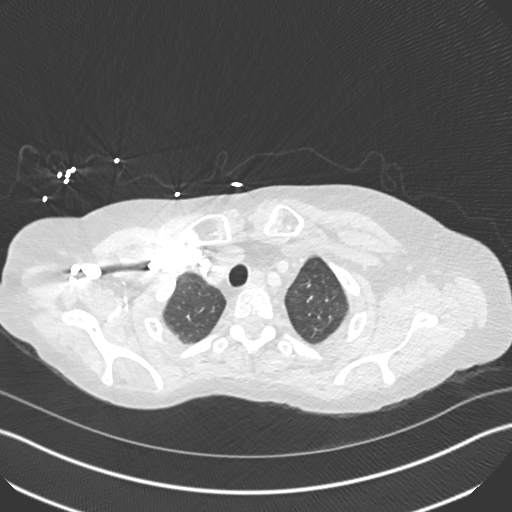
[im 300/334  mediastinal]
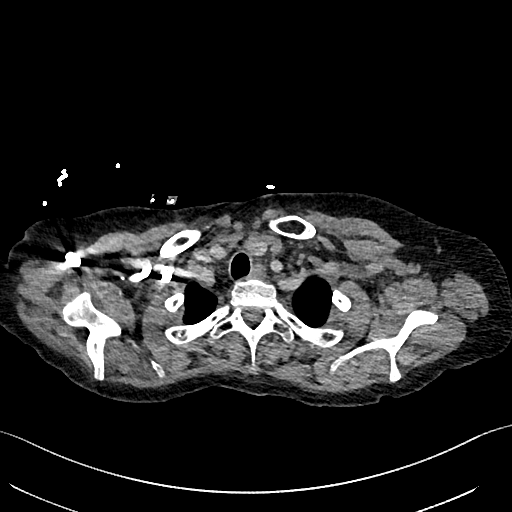
[im 317/334  lung]
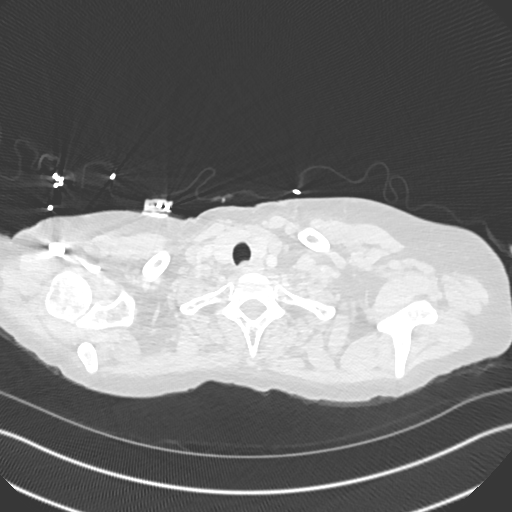

[Series 8: pe 2mm cor · coronal · 0.47mm/px · 1 of 126 slices shown]
[im 63/126  mediastinal]
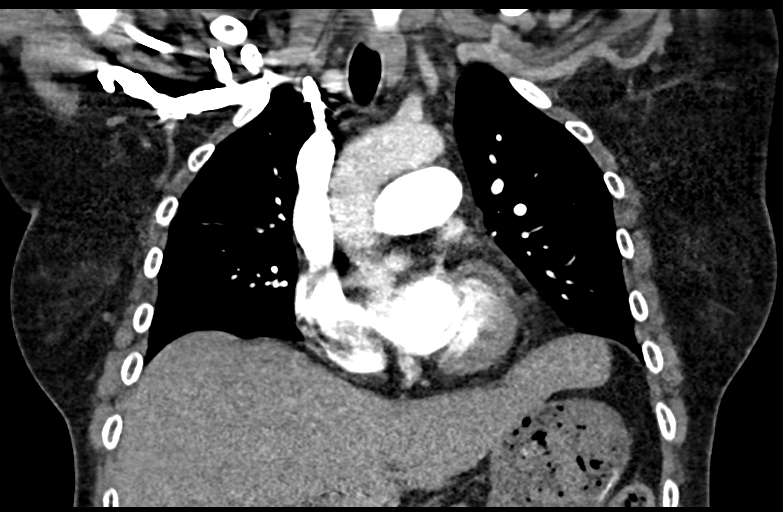

[18 of 36 positions shown; findings below may reference images not displayed]

FINDINGS: Cardiovascular: Excellent contrast bolus timing in the pulmonary
arterial tree.

No focal filling defect identified in the pulmonary arteries to
suggest acute pulmonary embolism.

Calcified coronary artery atherosclerosis (series 7, image 157).
Calcified aortic atherosclerosis. Little contrast in the aorta. No
cardiomegaly or pericardial effusion.

Mediastinum/Nodes: Negative. No lymphadenopathy.

Lungs/Pleura: Major airways are patent. Lower lung volumes compared
to the Jim CT. Dependent opacity most resembles atelectasis.
Mild additional nonspecific bilateral pulmonary ground-glass
opacity, although mostly dependent. And there is platelike opacity
in both the anterior right upper lobe and superior left lower lobe.
No pleural effusion.

Upper Abdomen: Scattered surgical clips in the upper abdomen are
redemonstrated. Stable visible upper abdominal viscera including a
low-density left adrenal adenoma.

Musculoskeletal: No acute osseous abnormality identified.

Review of the MIP images confirms the above findings.
IMPRESSION: 1. Negative for pulmonary embolus.
2. Lower lung volumes from a prior CT with bilateral atelectasis.
Additional mild nonspecific pulmonary ground-glass opacity such that
3JJJQ-LU pneumonia is difficult to exclude.
3. Calcified coronary artery and Aortic atherosclerosis
([id number]).

## 2020-09-25 DIAGNOSIS — K219 Gastro-esophageal reflux disease without esophagitis: Secondary | ICD-10-CM | POA: Diagnosis not present

## 2020-09-25 DIAGNOSIS — I10 Essential (primary) hypertension: Secondary | ICD-10-CM | POA: Diagnosis not present

## 2020-10-05 ENCOUNTER — Ambulatory Visit: Payer: Medicare Other | Admitting: Cardiology

## 2020-10-09 ENCOUNTER — Ambulatory Visit: Payer: Medicare Other | Admitting: Cardiology

## 2020-10-09 NOTE — Progress Notes (Signed)
No show

## 2020-10-14 ENCOUNTER — Emergency Department (HOSPITAL_COMMUNITY)
Admission: EM | Admit: 2020-10-14 | Discharge: 2020-10-15 | Disposition: A | Discharge status: Home / Self Care | Payer: Medicare Other | Attending: Emergency Medicine | Admitting: Emergency Medicine

## 2020-10-14 ENCOUNTER — Encounter (HOSPITAL_COMMUNITY): Payer: Self-pay | Admitting: Emergency Medicine

## 2020-10-14 ENCOUNTER — Other Ambulatory Visit: Payer: Self-pay

## 2020-10-14 DIAGNOSIS — R197 Diarrhea, unspecified: Secondary | ICD-10-CM | POA: Diagnosis not present

## 2020-10-14 DIAGNOSIS — K219 Gastro-esophageal reflux disease without esophagitis: Secondary | ICD-10-CM | POA: Insufficient documentation

## 2020-10-14 DIAGNOSIS — R1084 Generalized abdominal pain: Secondary | ICD-10-CM | POA: Diagnosis not present

## 2020-10-14 DIAGNOSIS — R1010 Upper abdominal pain, unspecified: Secondary | ICD-10-CM | POA: Insufficient documentation

## 2020-10-14 DIAGNOSIS — R109 Unspecified abdominal pain: Secondary | ICD-10-CM

## 2020-10-14 DIAGNOSIS — R112 Nausea with vomiting, unspecified: Secondary | ICD-10-CM | POA: Insufficient documentation

## 2020-10-14 DIAGNOSIS — F17210 Nicotine dependence, cigarettes, uncomplicated: Secondary | ICD-10-CM | POA: Diagnosis not present

## 2020-10-14 DIAGNOSIS — Z955 Presence of coronary angioplasty implant and graft: Secondary | ICD-10-CM | POA: Diagnosis not present

## 2020-10-14 DIAGNOSIS — Z79899 Other long term (current) drug therapy: Secondary | ICD-10-CM | POA: Insufficient documentation

## 2020-10-14 DIAGNOSIS — I25119 Atherosclerotic heart disease of native coronary artery with unspecified angina pectoris: Secondary | ICD-10-CM | POA: Insufficient documentation

## 2020-10-14 DIAGNOSIS — Z7982 Long term (current) use of aspirin: Secondary | ICD-10-CM | POA: Diagnosis not present

## 2020-10-14 DIAGNOSIS — E039 Hypothyroidism, unspecified: Secondary | ICD-10-CM | POA: Insufficient documentation

## 2020-10-14 DIAGNOSIS — Z8616 Personal history of COVID-19: Secondary | ICD-10-CM | POA: Diagnosis not present

## 2020-10-14 DIAGNOSIS — D72829 Elevated white blood cell count, unspecified: Secondary | ICD-10-CM | POA: Insufficient documentation

## 2020-10-14 DIAGNOSIS — I10 Essential (primary) hypertension: Secondary | ICD-10-CM | POA: Insufficient documentation

## 2020-10-14 LAB — COMPREHENSIVE METABOLIC PANEL
ALT: 22 U/L (ref 0–44)
AST: 24 U/L (ref 15–41)
Albumin: 4 g/dL (ref 3.5–5.0)
Alkaline Phosphatase: 82 U/L (ref 38–126)
Anion gap: 13 (ref 5–15)
BUN: 10 mg/dL (ref 6–20)
CO2: 19 mmol/L — ABNORMAL LOW (ref 22–32)
Calcium: 9 mg/dL (ref 8.9–10.3)
Chloride: 105 mmol/L (ref 98–111)
Creatinine, Ser: 0.61 mg/dL (ref 0.44–1.00)
GFR, Estimated: >60 mL/min (ref >60)
Glucose, Bld: 87 mg/dL (ref 70–99)
Potassium: 3.5 mmol/L (ref 3.5–5.1)
Sodium: 137 mmol/L (ref 135–145)
Total Bilirubin: 0.6 mg/dL (ref 0.3–1.2)
Total Protein: 7.7 g/dL (ref 6.5–8.1)

## 2020-10-14 LAB — LIPASE, BLOOD: Lipase: 17 U/L (ref 11–51)

## 2020-10-14 MED ORDER — SODIUM CHLORIDE 0.9 % IV SOLN
25.0000 mg | Freq: Four times a day (QID) | INTRAVENOUS | Status: DC | PRN
  Administered 2020-10-15: 25 mg via INTRAVENOUS
  Filled 2020-10-14: qty 1

## 2020-10-14 MED ORDER — LACTATED RINGERS IV BOLUS
1000.0000 mL | Freq: Once | INTRAVENOUS | Status: AC
  Administered 2020-10-15: 1000 mL via INTRAVENOUS

## 2020-10-14 MED ORDER — HYDROMORPHONE HCL 1 MG/ML IJ SOLN
1.0000 mg | Freq: Once | INTRAMUSCULAR | Status: AC
Start: 2020-10-14 — End: 2020-10-15
  Administered 2020-10-15: 1 mg via INTRAVENOUS
  Filled 2020-10-14: qty 1

## 2020-10-14 NOTE — ED Triage Notes (Signed)
Pt c/o generalized abd pain, and N/V/D since Thursday. Pt states hx of same.

## 2020-10-14 NOTE — ED Provider Notes (Signed)
[facility] EMERGENCY DEPARTMENT Provider Note   CSN: [id number] Arrival date & time: 10/14/20  2102     History Chief Complaint  Patient presents with   Abdominal Pain    Angel Price is a 55 y.o. female.  The history is provided by the patient.  Abdominal Pain She has history of hypertension, gastric bypass and comes in with abdominal pain, vomiting, diarrhea for the last 3 days.  Pain is in the mid and upper abdomen but does radiate to the entire abdomen.  It does not radiate to the back or chest or shoulder.  She rates pain a 10/10.  She has vomited numerous times and had numerous episodes of diarrhea.  She denies any blood or mucus in stool or emesis.  She has had numerous other episodes like this.  She has tried taking her pantoprazole and sucralfate without any benefit.  She denies fever chills or sweats.   Past Medical History:  Diagnosis Date   Abscess    soft tissue   Adrenal mass ([place])    Alcohol abuse    Anxiety    Blood transfusion without reported diagnosis    Chronic abdominal pain    Chronic wound infection of abdomen    Colon polyp    colonoscopy 04/2014   Depression    Diverticulosis    colonoscopy 04/2014 moderat pan colonic   Drug-seeking behavior    Gastritis    EGD 05/2014   Gastroparesis Nov 2015   GERD (gastroesophageal reflux disease)    Hemorrhoid    internal large   Hiatal hernia    History of Billroth II operation    Hypertension    Lung nodule    CT 02/2014 needs repeat 1 month   Lung nodule < 6cm on CT 04/25/2014   Lupus ([place])    Malingering    Nausea and vomiting    chronic, recurrent   Pancreatitis    Schatzki's ring    patent per EGD 04/2014   Sickle cell trait ([place])    Suicide attempt ([place])    Thyroid disease 2000   overactive, radiation    Patient Active Problem List   Diagnosis Date Noted   Mixed hyperlipidemia 04/27/2020   Pain in both lower extremities 12/29/2019   Coronary artery disease of native artery of native  heart with stable angina pectoris ([place]) 06/25/2019   Shortness of breath 06/15/2019   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   Acute respiratory failure with hypoxia ([place]) 05/20/2019   Essential hypertension 05/20/2019   COVID-19 05/20/2019   Syncope and collapse 05/20/2019   Right sided weakness 0000000   Metabolic acidosis 0000000   Intractable vomiting with nausea 02/23/2019   Abnormal CT scan, colon 12/09/2018   Abnormal computed tomography of cecum and terminal ileum 12/09/2018   Cocaine abuse ([place])    Colitis 11/11/2018   Hemorrhoidal skin tag    SBO (small bowel obstruction) (H[facility])    Non-intractable vomiting with nausea    Folate deficiency 05/11/2017   Abdominal wall fistula    Cellulitis of abdominal wall 01/02/2017   Acute left hemiparesis ([place]) 12/05/2016   Malingering 12/05/2016   Weakness of left lower extremity 12/02/2016   CVA (cerebral vascular accident) ([place]) 11/23/2016   Bright red rectal bleeding 08/22/2016   Hypotension due to blood loss    Acute GI bleeding 05/24/2016   Dieulafoy lesion of duodenum    History of Billroth II operation    Gastrointestinal hemorrhage 05/22/2016   Absolute anemia  Acute pancreatitis 10/01/2015   Abnormal CT scan of lung 10/01/2015   Alcohol abuse with intoxication ([place])    Left-sided weakness    Psychosomatic factor in physical condition    Upper GI bleed    Diverticulosis of colon with hemorrhage    Chronic wound infection of abdomen 12/22/2014   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   HTN (hypertension) 12/22/2014   Ataxia 11/01/2014   Hemorrhoids 04/26/2014   Lung nodule 04/26/2014   Diverticulosis    Gastritis    Hiatal hernia    Schatzki's ring    Acute blood loss anemia 04/25/2014   Sinus tachycardia 04/25/2014   Hypokalemia 04/25/2014   Hematemesis with nausea 04/25/2014   Lung nodule < 6cm on CT 04/25/2014   Intractable nausea and vomiting 04/24/2014   Gastroparesis    Abdominal pain 04/01/2014   Chronic  abdominal pain 01/21/2014   Gastroenteritis 12/10/2013   Chronic abdominal wound infection 08/16/2013   MDD (major depressive disorder), recurrent episode, severe ([place]) 06/27/2013   Wrist laceration 06/24/2013   Diarrhea 05/07/2013   Rectal bleeding 05/07/2013   Abnormal LFTs 05/07/2013   Adrenal mass, left ([place]) 05/07/2013   Abdominal wall abscess at site of surgical wound 04/19/2013   Abdominal wall abscess 04/18/2013   Frequent headaches 03/30/2013   Sleep difficulties 03/30/2013   Essential hypertension, benign 03/30/2013   History of cocaine abuse ([place]) 03/16/2013   History of schizoaffective disorder 03/16/2013   Bipolar disorder ([place]) 03/16/2013   Personality disorder ([place]) 03/16/2013   Tobacco abuse 03/16/2013   Alcohol abuse 03/16/2013   Palpitations 03/16/2013   Poor vision 03/16/2013   History of gastric bypass 03/16/2013   Status post hysterectomy with oophorectomy 03/16/2013   Hypothyroid 03/16/2013   Lupus ([place]) 03/16/2013    Past Surgical History:  Procedure Laterality Date   ABDOMINAL HYSTERECTOMY  2013   [place]   ABDOMINAL HYSTERECTOMY     ABDOMINAL SURGERY     ADRENALECTOMY Right    AGILE CAPSULE N/A 01/05/2015   Procedure: AGILE CAPSULE;  Surgeon: Daneil Dolin, MD;  Location: [facility];  Service: Endoscopy;  Laterality: N/A;  0700   Billroth II procedure      [place], first 2000, 2005/2006.   bilroth 2     BIOPSY  05/20/2013   Procedure: BIOPSIES OF ASCENDING AND SIGMOID COLON;  Surgeon: Daneil Dolin, MD;  Location: [facility] OR[facility];  Service: Endoscopy;;   BIOPSY  04/26/2014   Procedure: BIOPSIES;  Surgeon: Danie Binder, MD;  Location: [facility];  Service: Endoscopy;;   BIOPSY  12/24/2018   Procedure: BIOPSY;  Surgeon: Daneil Dolin, MD;  Location: [facility];  Service: Endoscopy;;  colon   CHOLECYSTECTOMY     COLONOSCOPY     in [place]   COLONOSCOPY WITH PROPOFOL N/A 05/20/2013   Dr.Rourk- inadequate prep, normal appearing rectum, grossly normal  colon aside from pancolonic diverticula, normal terminal ileum bx= unremarkable colonic mucosa. Due for early interval 2016.    COLONOSCOPY WITH PROPOFOL N/A 04/26/2014   [id number] ileum/one colon polyp removed/moderate pan-colonic diverticulosis/large internal hemorrhoids   COLONOSCOPY WITH PROPOFOL N/A 12/23/2014   Dr.Rourk- minimal internal hemorrhoids, pancolonic diverticulosis   COLONOSCOPY WITH PROPOFOL N/A 08/23/2016   Procedure: COLONOSCOPY WITH PROPOFOL;  Surgeon: Danie Binder, MD;  [facility];  Service: Endoscopy;  Laterality: N/A;   COLONOSCOPY WITH PROPOFOL N/A 12/24/2018   Pancolonic diverticulosis, normal TI, one 5 mm polyp in rectum (tubular adenoma). Somewhat friable hemorrhagic mucosa in area  of IC valve/cecum, possibly related to scope trauma s/p biopsy.    DEBRIDEMENT OF ABDOMINAL WALL ABSCESS N/A 02/08/2013   Procedure: DEBRIDEMENT OF ABDOMINAL WALL ABSCESS;  Surgeon: Jamesetta So, MD;  Location: [facility];  Service: General;  Laterality: N/A;   ESOPHAGOGASTRODUODENOSCOPY (EGD) WITH PROPOFOL N/A 05/20/2013   Dr.Rourk- s/p prior gastric surgery with normal esophagus, residual gastric mucosa and patent efferent limb   ESOPHAGOGASTRODUODENOSCOPY (EGD) WITH PROPOFOL N/A 02/03/2014   Dr. Gala Romney:  s/p hemigastrectomy with retained gastric contents. Residual gastric mucosa and efferent limb appeared normal otherwise. Query gastroparesis.    ESOPHAGOGASTRODUODENOSCOPY (EGD) WITH PROPOFOL N/A 04/26/2014   [id number] ring/small HH/mild non-erosive gasrtitis/normal anastomosis   ESOPHAGOGASTRODUODENOSCOPY (EGD) WITH PROPOFOL N/A 12/23/2014   Dr.Rourk- s/p prior hemigastrctomy, active oozing from anastomotic suture site, hemostasis achieved   ESOPHAGOGASTRODUODENOSCOPY (EGD) WITH PROPOFOL N/A 05/23/2016   Dr. 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    ESOPHAGOGASTRODUODENOSCOPY (EGD) WITH  PROPOFOL N/A 05/11/2017   Procedure: ESOPHAGOGASTRODUODENOSCOPY (EGD) WITH PROPOFOL;  Surgeon: Daneil Dolin, MD;  [facility];  Service: Endoscopy;  Laterality: N/A;   EXCISION OF MESH  08/2019   HEMORRHOID SURGERY N/A 06/18/2017   Procedure: THREE COLUMN EXTENSIVE HEMORRHOIDECTOMY;  Surgeon: Virl Cagey, MD;  Location: [facility];  Service: General;  Laterality: N/A;   INCISION AND DRAINAGE ABSCESS N/A 01/06/2017   Procedure: INCISION AND DRAINAGE ABDOMINAL WALL ABSCESS;  Surgeon: Aviva Signs, MD;  Location: [facility];  Service: General;  Laterality: N/A;   LEFT HEART CATH AND CORONARY ANGIOGRAPHY N/A 05/09/2020   Procedure: LEFT HEART CATH AND CORONARY ANGIOGRAPHY;  Surgeon: Nigel Mormon, MD;  Location: [facility];  Service: Cardiovascular;  Laterality: N/A;   POLYPECTOMY  12/24/2018   Procedure: POLYPECTOMY;  Surgeon: Daneil Dolin, MD;  [facility];  Service: Endoscopy;;  colon   tendon repar Right    wrist   WOUND EXPLORATION Right 06/24/2013   Procedure: exploration of traumatic wound right wrist;  Surgeon: Tennis Must, MD;  Location: [place];  Service: Orthopedics;  Laterality: Right;     OB History     Gravida  4   Para  4   Term  4   Preterm  0   AB  0   Living         SAB  0   IAB  0   Ectopic  0   Multiple      Live Births              Family History  Problem Relation Age of Onset   Lung cancer Father    Cancer Father        mets   Brain cancer Son    Schizophrenia Son    Cancer Son        brain   Drug abuse Mother    Breast cancer Maternal Aunt    Bipolar disorder Maternal Aunt    Drug abuse Maternal Aunt    Colon cancer Maternal Grandmother        late 29s, early 51s   Drug abuse Sister    Drug abuse Brother    Bipolar disorder Paternal Grandfather    Bipolar disorder Cousin    Liver disease Neg Hx     Social History   Tobacco Use   Smoking status: Some Days    Packs/day: 0.25  Years:  35.00    Pack years: 8.75    Types: Cigarettes   Smokeless tobacco: Never  Vaping Use   Vaping Use: Never used  Substance Use Topics   Alcohol use: Not Currently   Drug use: Not Currently    Types: Cocaine    Home Medications Prior to Admission medications   Medication Sig Start Date End Date Taking? Authorizing Provider  acetaminophen (TYLENOL) 500 MG tablet Take 2 tablets (1,000 mg total) by mouth 3 (three) times daily as needed for mild pain or moderate pain. 02/26/19   Enzo Bi, MD  amLODipine (NORVASC) 10 MG tablet Take 1 tablet (10 mg total) by mouth daily. 06/16/19   Pokhrel, Corrie Mckusick, MD  aspirin EC 81 MG EC tablet Take 1 tablet (81 mg total) by mouth daily. 05/19/19   Elgergawy, Silver Huguenin, MD  atenolol (TENORMIN) 100 MG tablet Take 1 tablet (100 mg total) by mouth daily. 08/23/20   Patwardhan, Manish J, MD  escitalopram (LEXAPRO) 20 MG tablet Take 20 mg by mouth daily. 04/05/20   [provider]  isosorbide mononitrate (IMDUR) 60 MG 24 hr tablet Take 0.5 tablets (30 mg total) by mouth daily. 05/31/20 08/29/20  Patwardhan, Reynold Bowen, MD  mirtazapine (REMERON) 15 MG tablet Take 15 mg by mouth at bedtime. 04/22/20   [provider]  nitroGLYCERIN (NITROSTAT) 0.4 MG SL tablet Place 1 tablet (0.4 mg total) under the tongue every 5 (five) minutes as needed for chest pain. Patient taking differently: Place 0.4 mg under the tongue every 5 (five) minutes x 3 doses as needed for chest pain. 12/29/19 03/28/20  Patwardhan, Reynold Bowen, MD  ondansetron (ZOFRAN ODT) 4 MG disintegrating tablet Take 1 tablet (4 mg total) by mouth every 8 (eight) hours as needed for nausea or vomiting. 08/30/20   Horton, Barbette Hair, MD  pantoprazole (PROTONIX) 20 MG tablet Take 1 tablet (20 mg total) by mouth daily. 11/07/19   Couture, Cortni S, PA-C  promethazine (PHENERGAN) 12.5 MG tablet Take 12.5 mg by mouth every 6 (six) hours as needed for nausea or vomiting.  12/18/19   [provider]  rosuvastatin  (CRESTOR) 20 MG tablet Take 1 tablet (20 mg total) by mouth daily. Patient taking differently: Take 20 mg by mouth at bedtime. 06/25/19 11/06/28  Patwardhan, Reynold Bowen, MD    Allergies    Metoclopramide and Reglan [metoclopramide]  Review of Systems   Review of Systems  Gastrointestinal:  Positive for abdominal pain.  All other systems reviewed and are negative.  Physical Exam Updated Vital Signs BP (!) 147/108 (BP Location: Right Arm)   Pulse (!) 104   Temp 98.4 F (36.9 C) (Oral)   Resp 16   Ht '5\' 3"'$  (1.6 m)   Wt 72.1 kg   LMP  (LMP Unknown)   SpO2 99%   BMI 28.17 kg/m   Physical Exam Vitals and nursing note reviewed.  55 year old female, resting comfortably and in no acute distress. Vital signs are significant for elevated blood pressure and slightly elevated heart rate. Oxygen saturation is 99%, which is normal. Head is normocephalic and atraumatic. PERRLA, EOMI. Oropharynx is clear. Neck is nontender and supple without adenopathy or JVD. Back is nontender and there is no CVA tenderness. Lungs are clear without rales, wheezes, or rhonchi. Chest is nontender. Heart has regular rate and rhythm without murmur. Abdomen is soft, flat, with moderate upper abdominal tenderness.  There is no rebound or guarding.  There are no masses or  hepatosplenomegaly and peristalsis is hypoactive. Extremities have no cyanosis or edema, full range of motion is present. Skin is warm and dry without rash. Neurologic: Mental status is normal, cranial nerves are intact, moves all extremities equally.  ED Results / Procedures / Treatments   Labs (all labs ordered are listed, but only abnormal results are displayed) Labs Reviewed  COMPREHENSIVE METABOLIC PANEL - Abnormal; Notable for the following components:      Result Value   CO2 19 (*)    All other components within normal limits  URINALYSIS, ROUTINE W REFLEX MICROSCOPIC - Abnormal; Notable for the following components:   Ketones, ur 5 (*)     All other components within normal limits  CBC WITH DIFFERENTIAL/PLATELET - Abnormal; Notable for the following components:   WBC 14.0 (*)    Platelets 441 (*)    Neutro Abs 11.0 (*)    All other components within normal limits  LIPASE, BLOOD   Procedures Procedures   Medications Ordered in ED Medications  promethazine (PHENERGAN) 25 mg in sodium chloride 0.9 % 50 mL IVPB (has no administration in time range)  lactated ringers bolus 1,000 mL (has no administration in time range)  HYDROmorphone (DILAUDID) injection 1 mg (has no administration in time range)    ED Course  I have reviewed the triage vital signs and the nursing notes.  Pertinent lab results that were available during my care of the patient were reviewed by me and considered in my medical decision making (see chart for details).   MDM Rules/Calculators/A&P                         Abdominal pain with vomiting and diarrhea.  Old records are reviewed, and she has several prior ED visits with similar complaints.  Apparently, this is something that started after her gastric bypass surgery.  Abdominal exam is benign today, no indication for imaging.  She will be given IV fluids, promethazine, hydromorphone.  Of note, I see 36 prior CT scans of her abdomen and pelvis on record.  Labs show mild leukocytosis.  After above-noted treatment, patient stated she was not feeling any better.  She was given additional hydromorphone and prochlorperazine following which she had significant relief.  She is discharged with prescription for prochlorperazine, follow-up with PCP.  Final Clinical Impression(s) / ED Diagnoses Final diagnoses:  Abdominal pain, unspecified abdominal location  Nausea vomiting and diarrhea    Rx / DC Orders ED Discharge Orders          Ordered    prochlorperazine (COMPAZINE) 10 MG tablet  Every 6 hours PRN        10/15/20 [id number]             Delora Fuel, MD [id number] [phone]

## 2020-10-15 LAB — URINALYSIS, ROUTINE W REFLEX MICROSCOPIC
Bilirubin Urine: NEGATIVE
Glucose, UA: NEGATIVE mg/dL
Hgb urine dipstick: NEGATIVE
Ketones, ur: 5 mg/dL — AB
Leukocytes,Ua: NEGATIVE
Nitrite: NEGATIVE
Protein, ur: NEGATIVE mg/dL
Specific Gravity, Urine: 1.012 (ref 1.005–1.030)
pH: 5 (ref 5.0–8.0)

## 2020-10-15 LAB — CBC WITH DIFFERENTIAL/PLATELET
Basophils Absolute: 0 10*3/uL (ref 0.0–0.1)
Basophils Relative: 0 %
Eosinophils Absolute: 0 10*3/uL (ref 0.0–0.5)
Eosinophils Relative: 0 %
HCT: 40 % (ref 36.0–46.0)
Hemoglobin: 13.1 g/dL (ref 12.0–15.0)
Lymphocytes Relative: 16 %
Lymphs Abs: 2.2 10*3/uL (ref 0.7–4.0)
MCHC: 32.8 g/dL (ref 30.0–36.0)
MCV: 94.8 fL (ref 80.0–100.0)
Monocytes Absolute: 1 10*3/uL (ref 0.1–1.0)
Monocytes Relative: 5 %
Neutro Abs: 11 10*3/uL — ABNORMAL HIGH (ref 1.7–7.7)
Neutrophils Relative %: 78 %
Platelets: 441 10*3/uL — ABNORMAL HIGH (ref 150–400)
RBC: 4.22 MIL/uL (ref 3.87–5.11)
RDW: 12.8 % (ref 11.5–15.5)
WBC: 14 10*3/uL — ABNORMAL HIGH (ref 4.0–10.5)

## 2020-10-15 MED ORDER — PROCHLORPERAZINE EDISYLATE 10 MG/2ML IJ SOLN
10.0000 mg | Freq: Once | INTRAMUSCULAR | Status: AC
  Administered 2020-10-15: 10 mg via INTRAVENOUS
  Filled 2020-10-15: qty 2

## 2020-10-15 MED ORDER — HYDROMORPHONE HCL 1 MG/ML IJ SOLN
1.0000 mg | Freq: Once | INTRAMUSCULAR | Status: AC
Start: 2020-10-15 — End: 2020-10-15
  Administered 2020-10-15: 1 mg via INTRAVENOUS
  Filled 2020-10-15: qty 1

## 2020-10-15 MED ORDER — PROCHLORPERAZINE MALEATE 10 MG PO TABS: 10.0000 mg | ORAL_TABLET | Freq: Four times a day (QID) | ORAL | 0 refills | Status: DC | PRN

## 2020-10-15 MED ORDER — PROMETHAZINE HCL 25 MG/ML IJ SOLN
INTRAMUSCULAR | Status: AC
  Filled 2020-10-15: qty 1

## 2020-10-19 DIAGNOSIS — R079 Chest pain, unspecified: Secondary | ICD-10-CM | POA: Diagnosis not present

## 2020-10-19 DIAGNOSIS — R29898 Other symptoms and signs involving the musculoskeletal system: Secondary | ICD-10-CM | POA: Diagnosis not present

## 2020-10-19 DIAGNOSIS — Z955 Presence of coronary angioplasty implant and graft: Secondary | ICD-10-CM | POA: Diagnosis not present

## 2020-10-19 DIAGNOSIS — Z885 Allergy status to narcotic agent status: Secondary | ICD-10-CM | POA: Diagnosis not present

## 2020-10-19 DIAGNOSIS — Z888 Allergy status to other drugs, medicaments and biological substances status: Secondary | ICD-10-CM | POA: Diagnosis not present

## 2020-10-19 DIAGNOSIS — I10 Essential (primary) hypertension: Secondary | ICD-10-CM | POA: Diagnosis not present

## 2020-10-20 ENCOUNTER — Encounter: Payer: Self-pay | Admitting: Cardiology

## 2020-10-20 ENCOUNTER — Ambulatory Visit: Payer: Medicare Other | Admitting: Cardiology

## 2020-10-20 ENCOUNTER — Other Ambulatory Visit: Payer: Self-pay

## 2020-10-20 VITALS — BP 138/100 | HR 79 | Temp 97.7°F | Resp 17 | Ht 63.0 in | Wt 153.0 lb

## 2020-10-20 DIAGNOSIS — I25118 Atherosclerotic heart disease of native coronary artery with other forms of angina pectoris: Secondary | ICD-10-CM

## 2020-10-20 DIAGNOSIS — E782 Mixed hyperlipidemia: Secondary | ICD-10-CM

## 2020-10-20 IMAGING — DX DG CHEST 1V PORT
1 series · 1 of 1 positions shown · non-contrast
Comparison: 05/20/2019

CLINICAL DATA: Chest pain and shortness of breath

EXAM:
PORTABLE CHEST 1 VIEW

[chest ap]
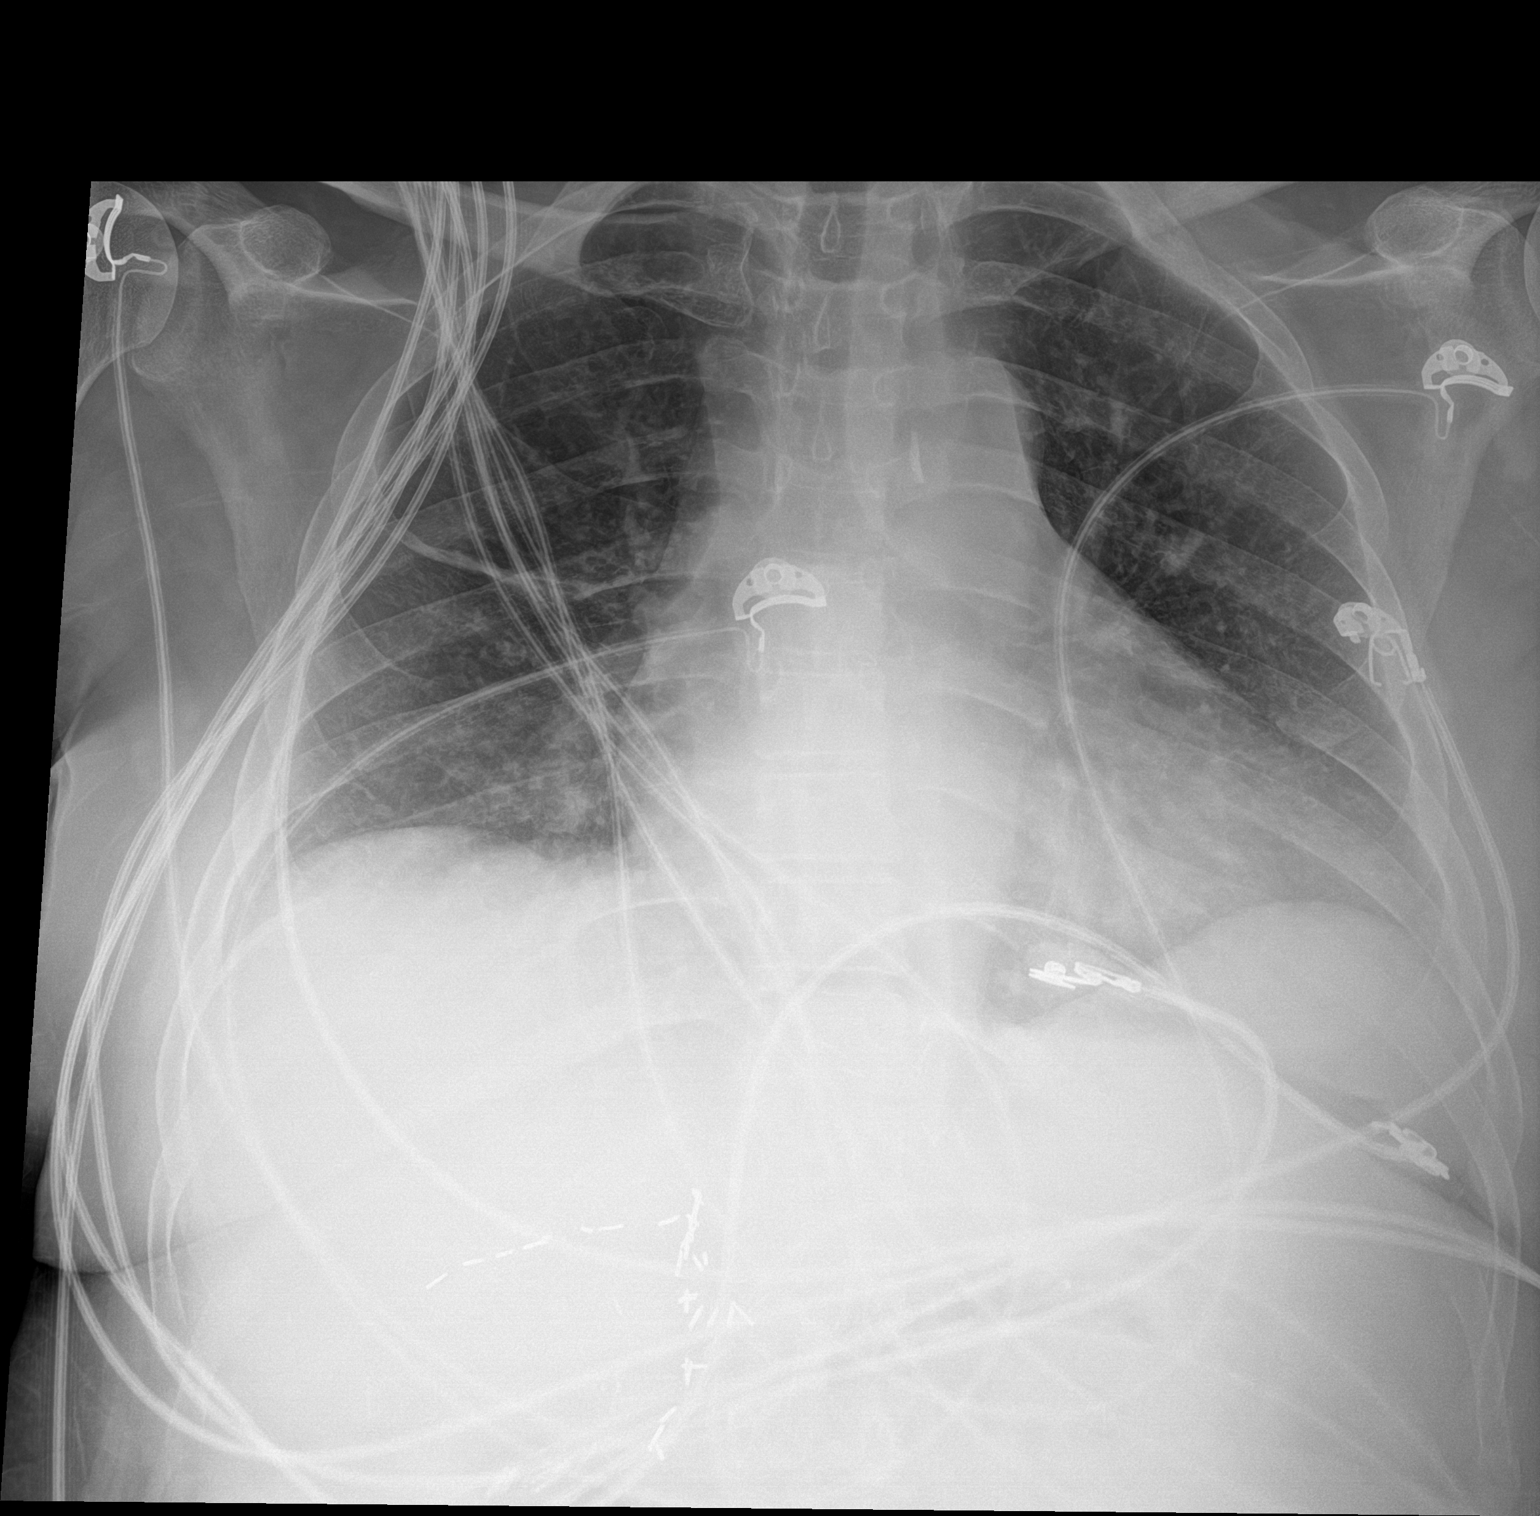

[1 of 1 positions shown; findings below may reference images not displayed]

FINDINGS: The heart size and mediastinal contours are within normal limits.
Both lungs are clear. The visualized skeletal structures are
unremarkable.
IMPRESSION: No active disease.

## 2020-10-20 IMAGING — CT CT HEAD CODE STROKE
4 series · 17 of 47 positions shown, 19 images · non-contrast
Comparison: None.

CLINICAL DATA: Code stroke.  Left-sided weakness

EXAM:
CT HEAD WITHOUT CONTRAST
TECHNIQUE: Contiguous axial images were obtained from the base of the skull
through the vertex without intravenous contrast.

[Series 3: head wo · axial · 0.41mm/px · z∈[-115,+10]mm · 7 of 35 slices shown, 9 images]
[im 5/35  brain]
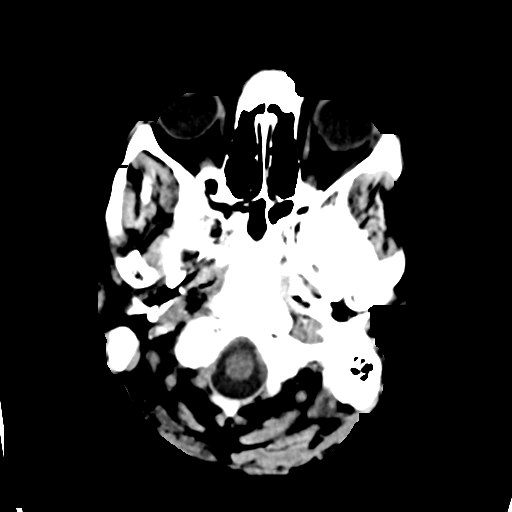
[im 5/35  bone]
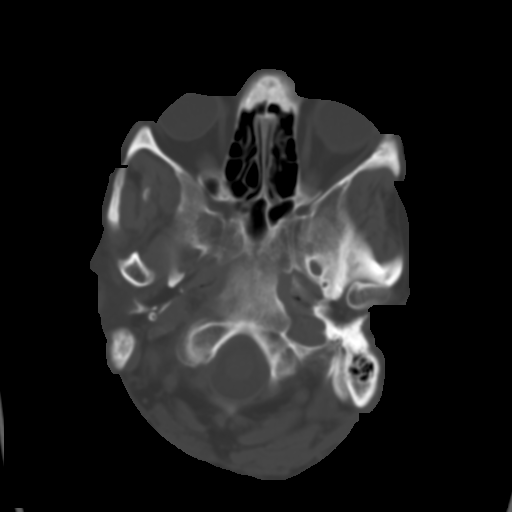
[im 9/35  brain]
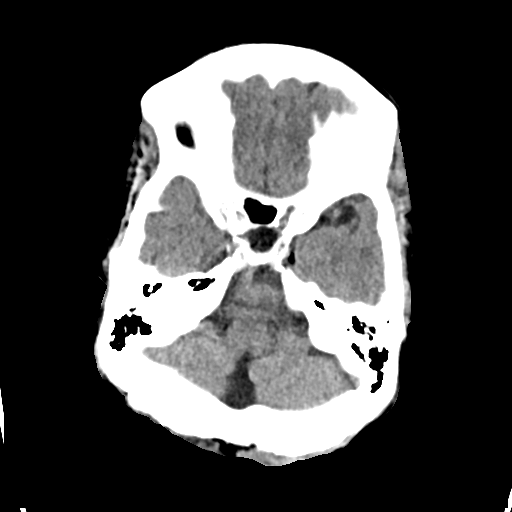
[im 13/35  brain]
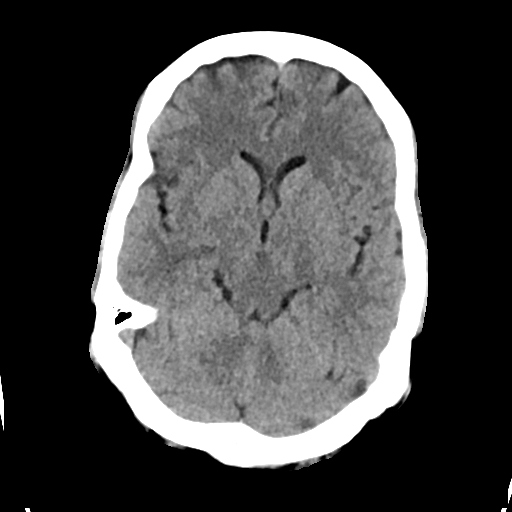
[im 18/35  brain]
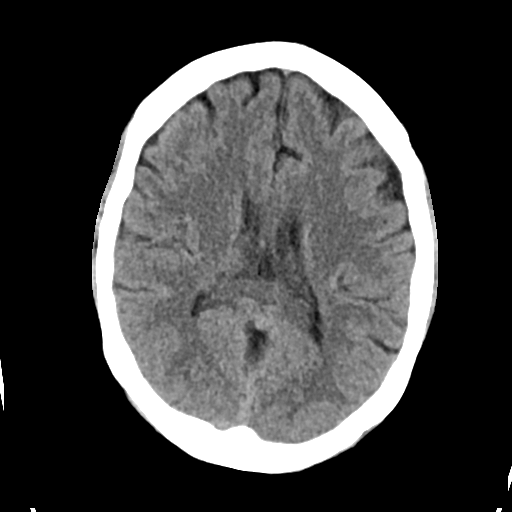
[im 22/35  brain]
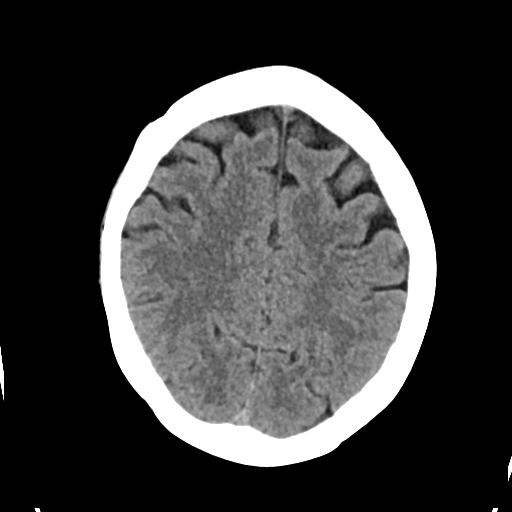
[im 22/35  bone]
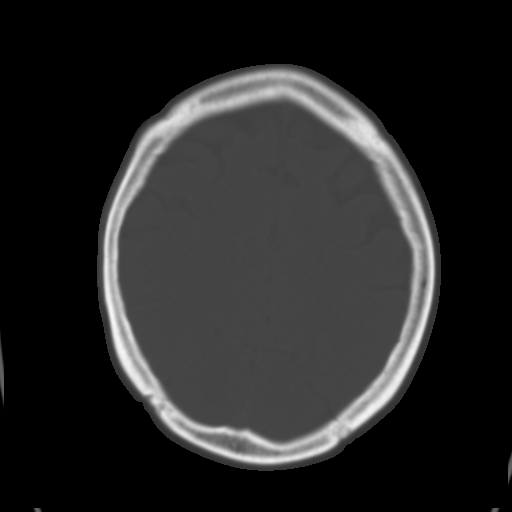
[im 26/35  brain]
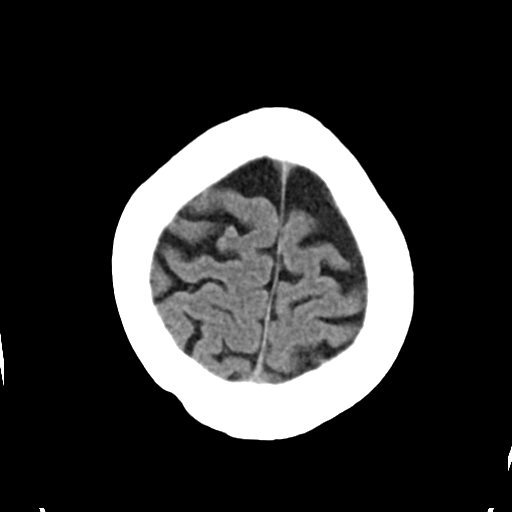
[im 30/35  brain]
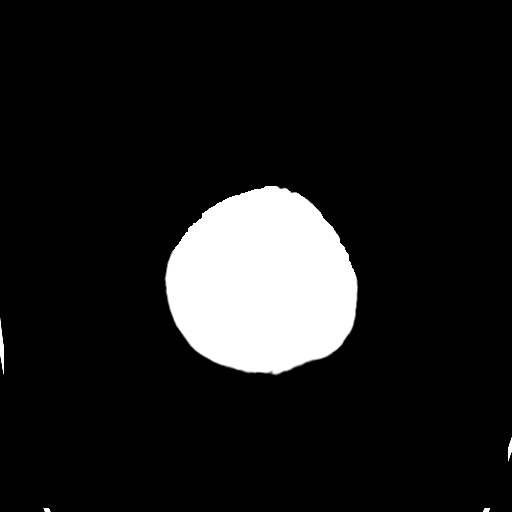

[Series 4: head bone · axial · 0.41mm/px · z∈[-119,-59]mm · 4 of 86 slices shown]
[im 9/86  bone]
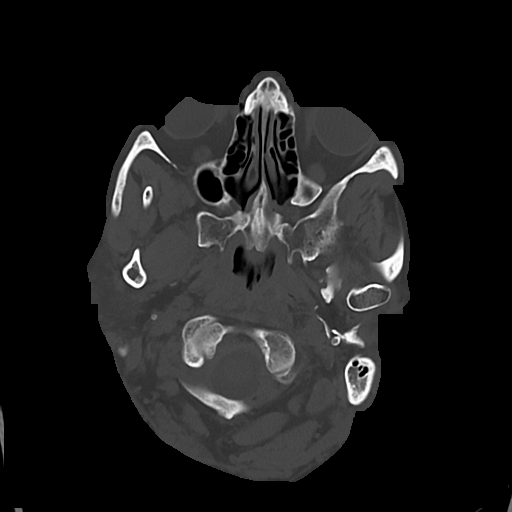
[im 18/86  bone]
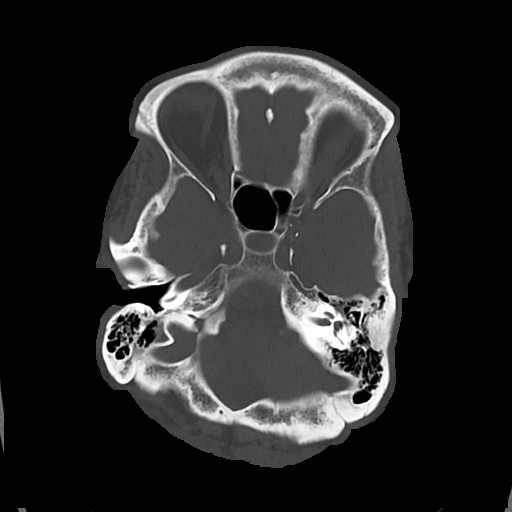
[im 26/86  bone]
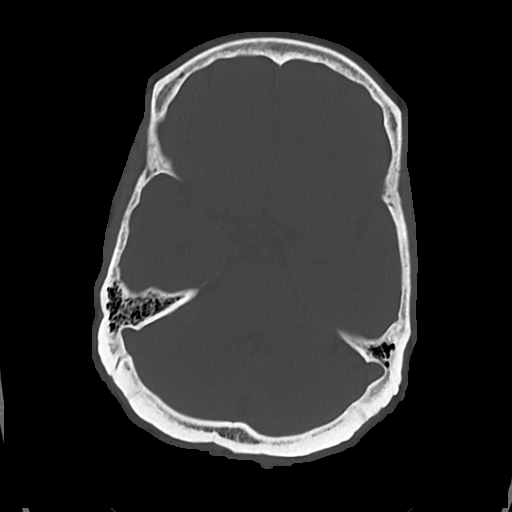
[im 39/86  bone]
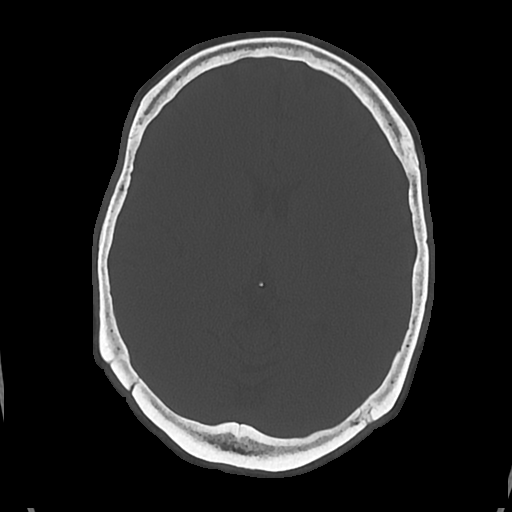

[Series 5: cor soft · coronal · 0.34mm/px · 3 of 67 slices shown]
[im 23/67  brain]
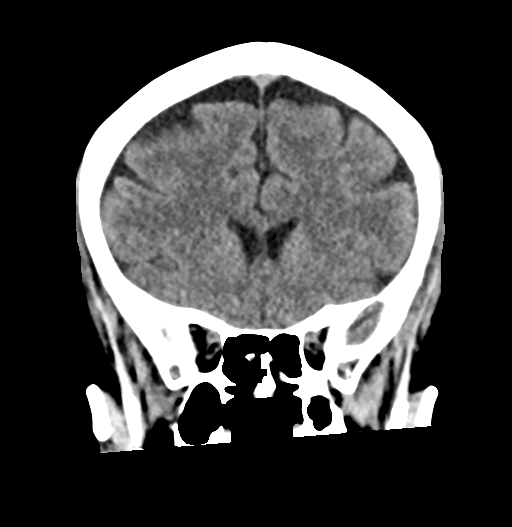
[im 30/67  brain]
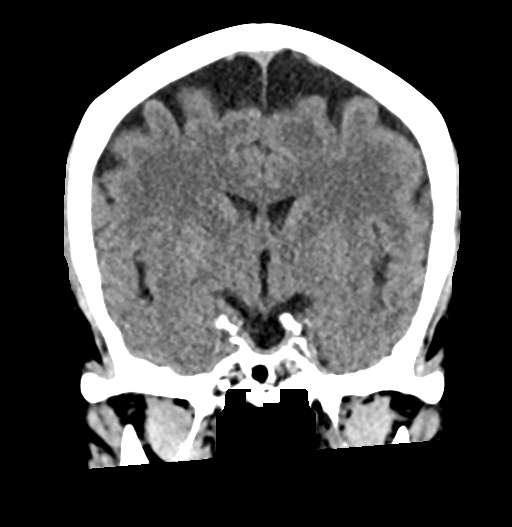
[im 37/67  brain]
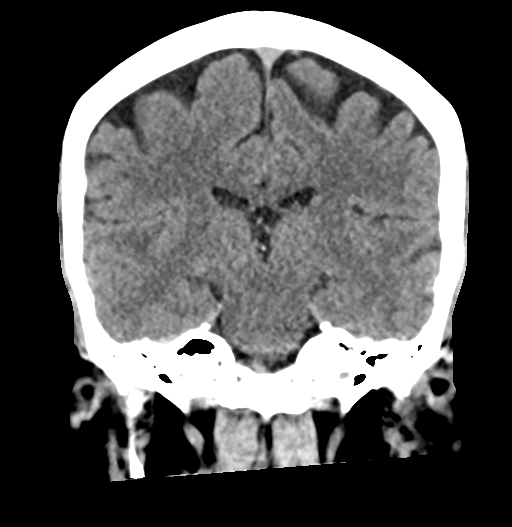

[Series 6: sag soft · sagittal · 0.35mm/px · 3 of 55 slices shown]
[im 19/55  brain]
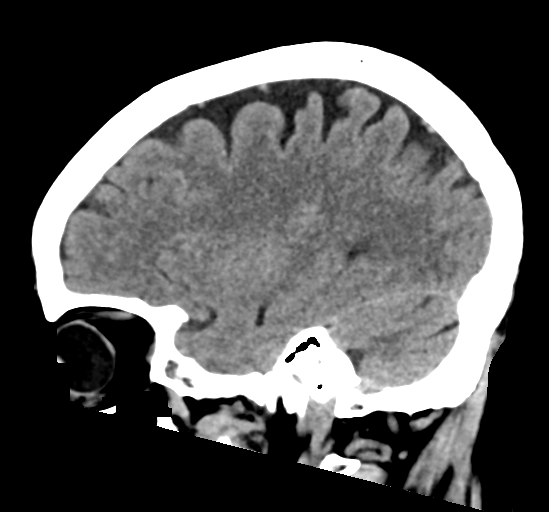
[im 28/55  brain]
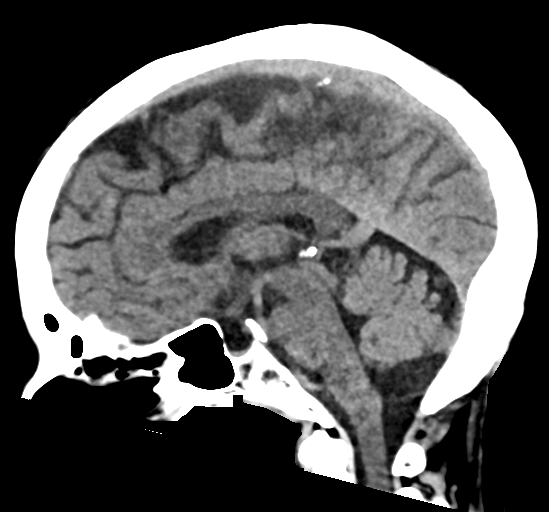
[im 37/55  brain]
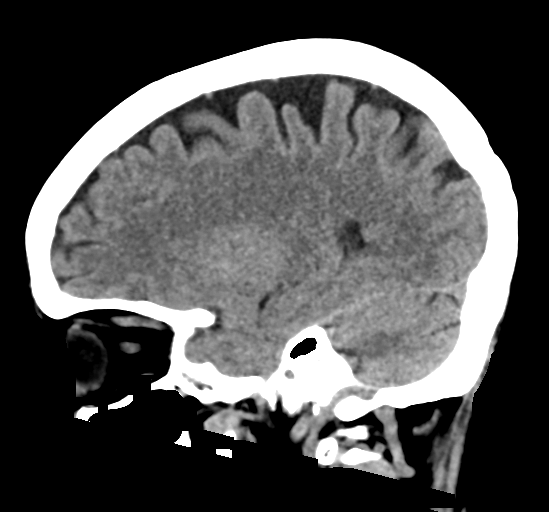

[17 of 47 positions shown; findings below may reference images not displayed]

FINDINGS: Brain: There is no mass, hemorrhage or extra-axial collection. The
size and configuration of the ventricles and extra-axial CSF spaces
are normal. The brain parenchyma is normal, without evidence of
acute or chronic infarction.

Vascular: No abnormal hyperdensity of the major intracranial
arteries or dural venous sinuses. No intracranial atherosclerosis.

Skull: The visualized skull base, calvarium and extracranial soft
tissues are normal.

Sinuses/Orbits: No fluid levels or advanced mucosal thickening of
the visualized paranasal sinuses. No mastoid or middle ear effusion.
The orbits are normal.

ASPECTS (Alberta Stroke Program Early CT Score)

- Ganglionic level infarction (caudate, lentiform nuclei, internal
capsule, insula, M1-M3 cortex): 7

- Supraganglionic infarction (M4-M6 cortex): 3

Total score (0-10 with 10 being normal): 10
IMPRESSION: 1. No acute abnormality
2. ASPECTS is 10
3. These results were communicated to Dr. Kattia Elena Renfro at
[DATE] on 06/15/2019 by text page via the [employer] messaging system.

## 2020-10-20 IMAGING — CT CT ANGIO CHEST
2 of 7 series · 18 of 46 positions shown · IV contrast (APPLIED)
Comparison: CT scan 05/18/2019

CLINICAL DATA: Respiratory distress.

EXAM:
CT ANGIOGRAPHY CHEST WITH CONTRAST
TECHNIQUE: Multidetector CT imaging of the chest was performed using the
standard protocol during bolus administration of intravenous
contrast. Multiplanar CT image reconstructions and MIPs were
obtained to evaluate the vascular anatomy.
CONTRAST:  75mL OMNIPAQUE IOHEXOL 350 MG/ML SOLN

[Series 7: thins · axial · 0.73mm/px · z∈[+1047,+1278]mm · 15 of 372 slices shown]
[im 21/372  lung]
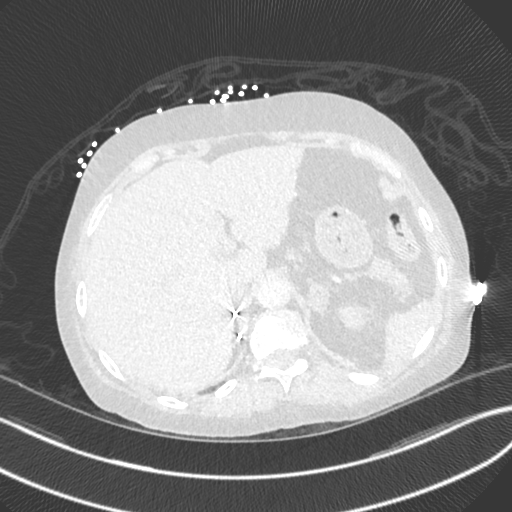
[im 42/372  soft-tissue]
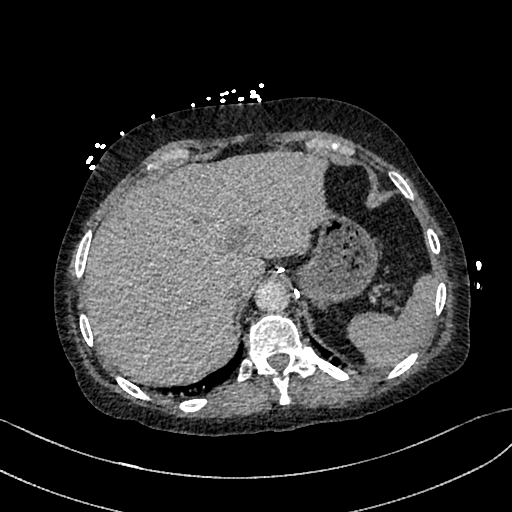
[im 62/372  lung]
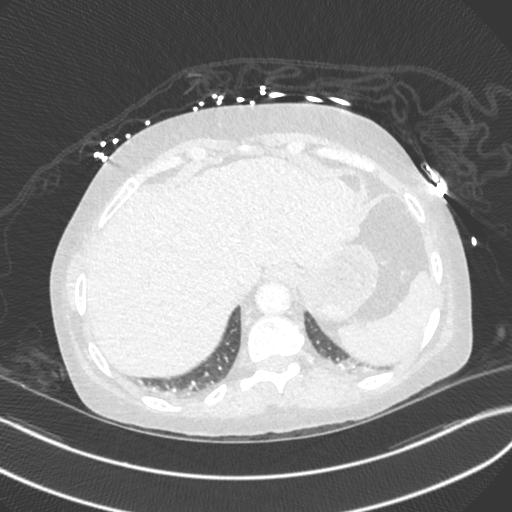
[im 83/372  soft-tissue]
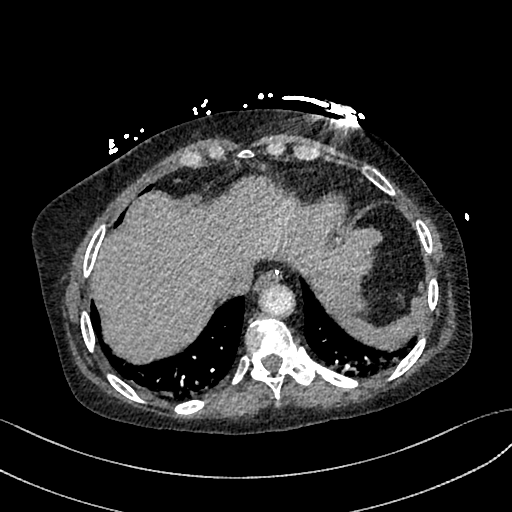
[im 124/372  lung]
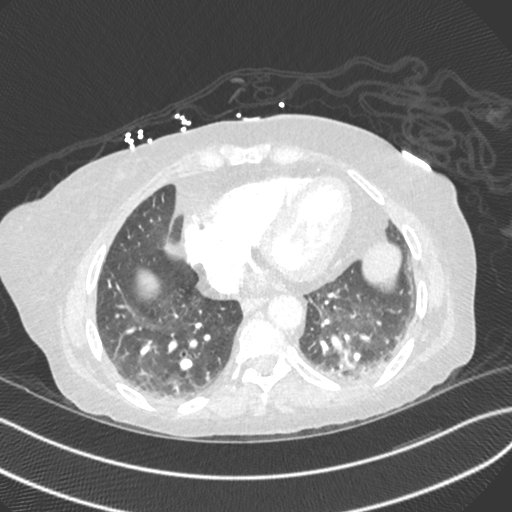
[im 145/372  soft-tissue]
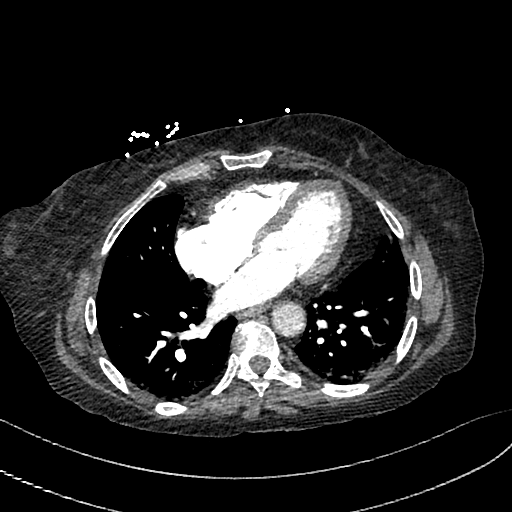
[im 165/372  lung]
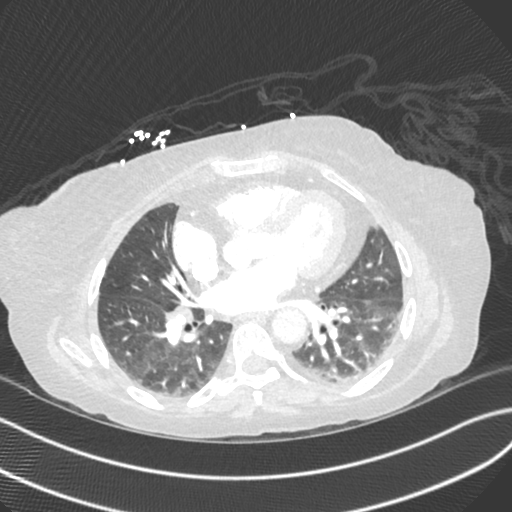
[im 186/372  soft-tissue]
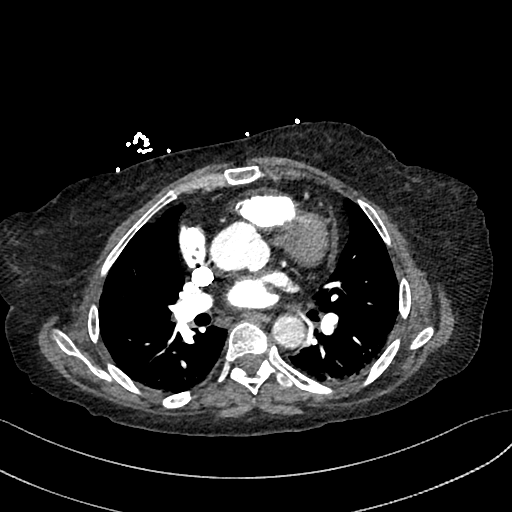
[im 207/372  lung]
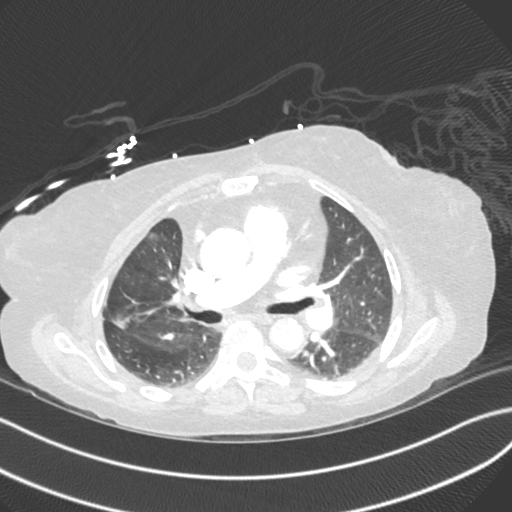
[im 227/372  soft-tissue]
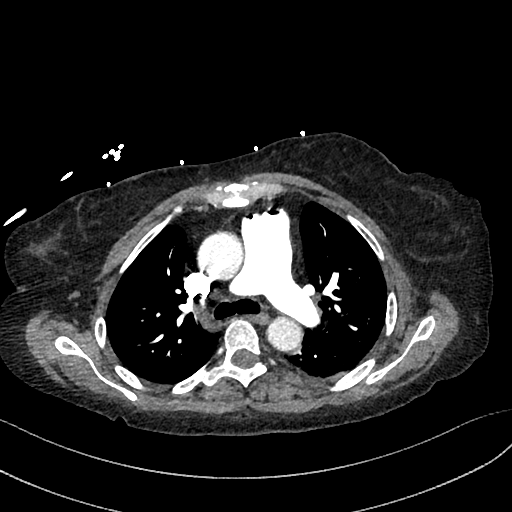
[im 248/372  lung]
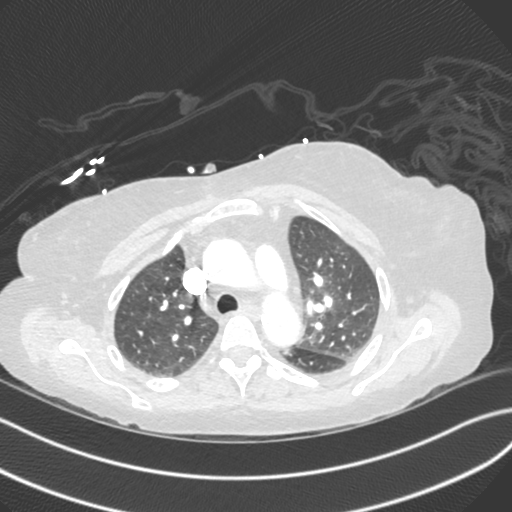
[im 289/372  soft-tissue]
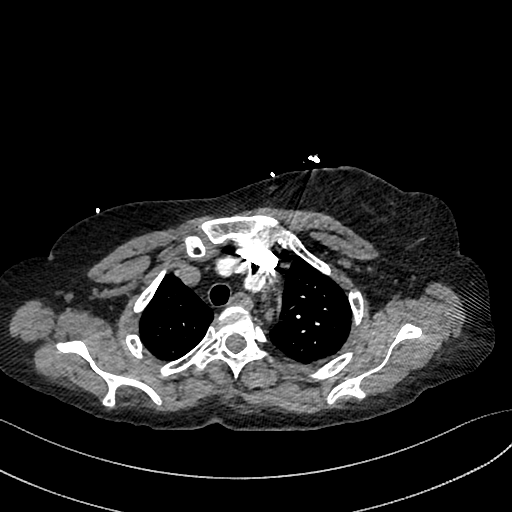
[im 310/372  lung]
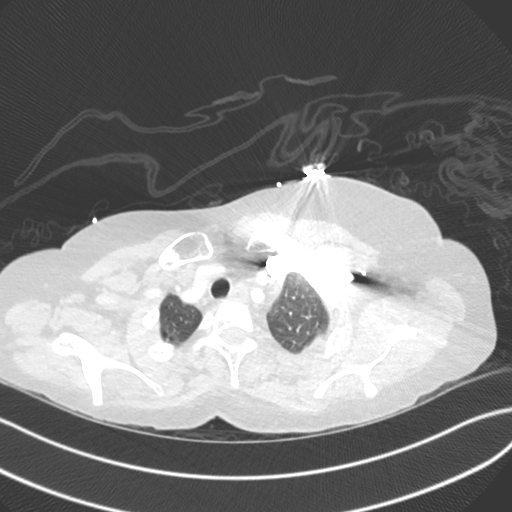
[im 330/372  soft-tissue]
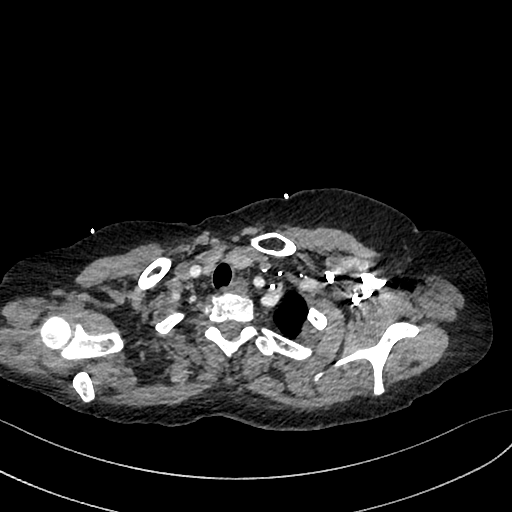
[im 351/372  lung]
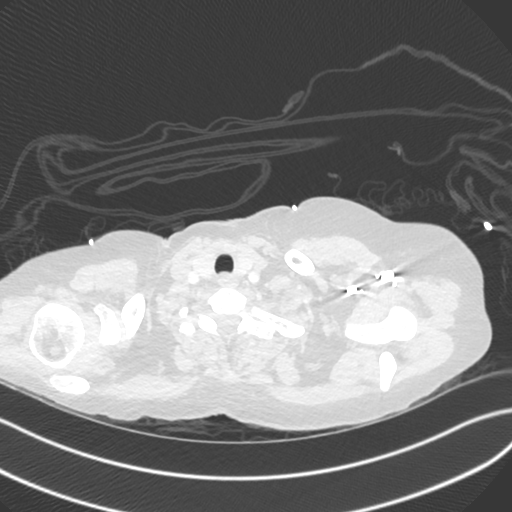

[Series 8: cor · coronal · 0.63mm/px · 3 of 131 slices shown]
[im 33/131  soft-tissue]
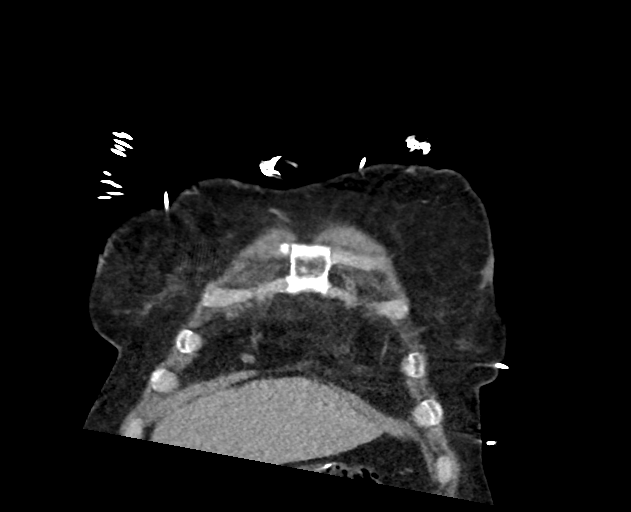
[im 66/131  soft-tissue]
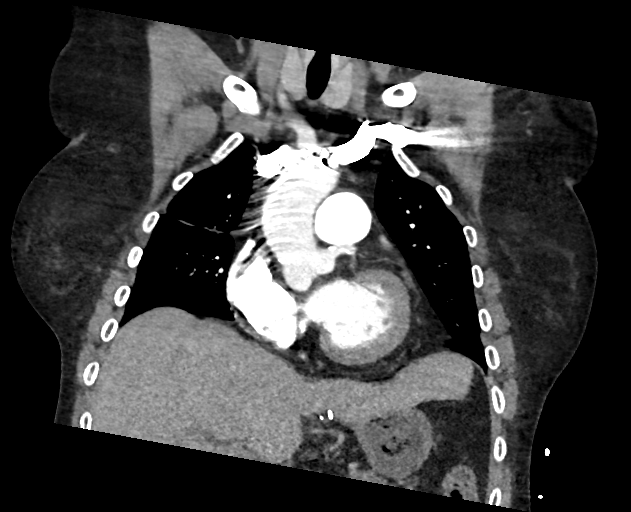
[im 98/131  soft-tissue]
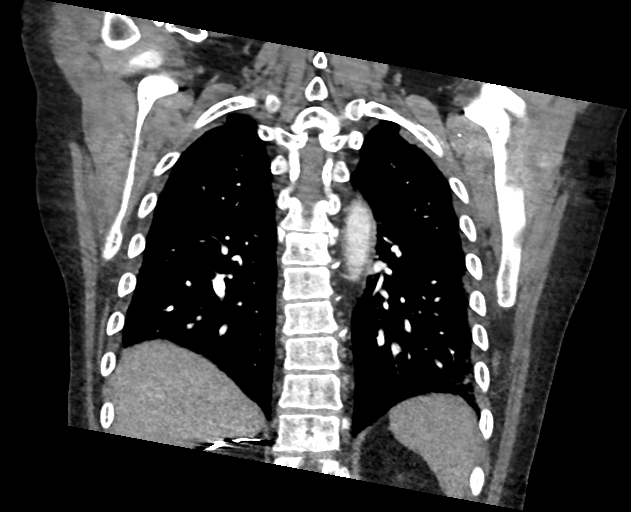

[18 of 46 positions shown; findings below may reference images not displayed]

FINDINGS: Cardiovascular: The heart is upper limits of normal in size and
appears stable. No pericardial effusion.

Mild tortuosity and ectasia of the thoracic aorta and scattered
atherosclerotic calcifications but no aneurysm or dissection. The
branch vessels are patent. Stable three-vessel coronary artery
calcifications.

Mild enlargement of the pulmonary arterial trunk which may suggest
pulmonary hypertension. The pulmonary arterial tree is well
opacified. No filling defects to suggest pulmonary embolism.

Mediastinum/Nodes: No mediastinal or hilar mass or adenopathy.
Stable scattered sub 8 mm lymph nodes. The esophagus is grossly
normal.

Lungs/Pleura: Dependent atelectasis/edema. There is also patchy
airspace opacity in the right upper lobe which is stable and likely
atelectasis or residual inflammation. Stable calcified granuloma in
the right middle lobe. No worrisome pulmonary lesions. No pleural
effusions.

Upper Abdomen: No significant upper abdominal findings. Stable
surgical changes.

Musculoskeletal: No breast masses, supraclavicular or axillary
adenopathy. The thyroid gland is grossly normal.

The bony structures are unremarkable.

Review of the MIP images confirms the above findings.
IMPRESSION: 1. No CT findings for pulmonary embolism.
2. Stable mild tortuosity and ectasia of the thoracic aorta but no
aneurysm or dissection.
3. Stable three-vessel coronary artery calcifications.
4. Stable patchy airspace opacity in the right upper lobe, likely
atelectasis or residual inflammation.
5. No mediastinal or hilar mass or adenopathy.

Aortic Atherosclerosis (1UED1-575.5).

Aortic Atherosclerosis (1UED1-575.5).

## 2020-10-20 MED ORDER — DILTIAZEM HCL ER COATED BEADS 240 MG PO CP24: 240.0000 mg | ORAL_CAPSULE | Freq: Every day | ORAL | 3 refills | Status: DC

## 2020-10-20 NOTE — Progress Notes (Signed)
Follow up visit  Subjective:   Angel Price, female    DOB: 03/01/1966, 55 y.o.   MRN: [id number]   Chief Complaint  Patient presents with   Coronary artery disease of native artery of native heart wi   Chest Pain    55 y.o. African female with hypertension, h/o tobacco and alcohol dependence, coronary artery disease, suspected microvascular dysfunction  Coronary angiogram (04/2020) showed no obstructive disease, but slow flow likely due to microvascular dysfunction.  Patient has had multiple ED visits with chest pain. Records not available to me. However, one of the ED providers from [place] personally spoke with me yesteday and reported that she had been ruled out for ACS.  Patient has constant throbbing pain in her chest radiating to left arm, "like a toothache" She has no specific worsening with exertion, breathing, or specific arm movements.  Initial consultation HPI 06/2019: Patient has been hospitalized twice in last 6 weeks with complaints of loss of consciousness and questionable cardiac arrest.  Both times, patient reportedly had episodes of loss of consciousness, and underwent CPR by first responders, but had pulse present when EMS arrived.  At least 1 of these occasions, patient actually remembers chest compressions being performed.  One of these episodes happened while patient was Covid positive, fortunately without any severe illness related to it.  Both times, she has had elevated alcohol level.  She has also had complaints of unilateral weakness with CT head and MRI being unremarkable.  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   Given left main and triple-vessel calcifications noted, she underwent nuclear stress test which did not show any ischemia.  Echocardiogram showed structurally normal heart without any significant abnormalities.  Patient continues to have constant chest pain throughout the day, only improved when she sleeps at night.  Patient  states that she has cut down her alcohol intake to about 1 beer per day.  She smokes 5 cigarettes/day.  In the past, patient used to have a sixpack every day.  She endorses having had too much to drink when she found out that her godmother passed off of it.  This is about the time she was admitted to the hospital and found to have alcohol level elevated.  Current Outpatient Medications on File Prior to Visit  Medication Sig Dispense Refill   acetaminophen (TYLENOL) 500 MG tablet Take 2 tablets (1,000 mg total) by mouth 3 (three) times daily as needed for mild pain or moderate pain. 30 tablet 0   amLODipine (NORVASC) 10 MG tablet Take 1 tablet (10 mg total) by mouth daily. 30 tablet 2   aspirin EC 81 MG EC tablet Take 1 tablet (81 mg total) by mouth daily. 30 tablet 0   atenolol (TENORMIN) 100 MG tablet Take 1 tablet (100 mg total) by mouth daily. 60 tablet 2   escitalopram (LEXAPRO) 20 MG tablet Take 20 mg by mouth daily.     ibuprofen (ADVIL) 800 MG tablet Take 800 mg by mouth 2 (two) times daily as needed.     isosorbide mononitrate (IMDUR) 60 MG 24 hr tablet Take 0.5 tablets (30 mg total) by mouth daily. 90 tablet 3   mirtazapine (REMERON) 15 MG tablet Take 15 mg by mouth at bedtime.     nicotine polacrilex (NICORETTE) 4 MG gum Take 4 mg by mouth as needed.     nitroGLYCERIN (NITROSTAT) 0.4 MG SL tablet Place 1 tablet (0.4 mg total) under the tongue every 5 (five) minutes  as needed for chest pain. (Patient taking differently: Place 0.4 mg under the tongue every 5 (five) minutes x 3 doses as needed for chest pain.) 30 tablet 3   ondansetron (ZOFRAN ODT) 4 MG disintegrating tablet Take 1 tablet (4 mg total) by mouth every 8 (eight) hours as needed for nausea or vomiting. 20 tablet 0   pantoprazole (PROTONIX) 20 MG tablet Take 1 tablet (20 mg total) by mouth daily. 30 tablet 0   prochlorperazine (COMPAZINE) 10 MG tablet Take 1 tablet (10 mg total) by mouth every 6 (six) hours as needed for nausea  or vomiting. 20 tablet 0   promethazine (PHENERGAN) 12.5 MG tablet Take 12.5 mg by mouth every 6 (six) hours as needed for nausea or vomiting.      rosuvastatin (CRESTOR) 20 MG tablet Take 1 tablet (20 mg total) by mouth daily. (Patient taking differently: Take 20 mg by mouth at bedtime.) 90 tablet 3   No current facility-administered medications on file prior to visit.    Cardiovascular & other pertient studies:  EKG 10/20/2020: Sinus rhythm 73 bpm  Nonspecific T wave inverison anteroseptal leads  Cardiology monitoring reviewed 08/23/2020: Multiple episodes of sinus tachycardia up to 130 bpm correlating with patient symptoms of palpitations. No other tachyarrhythmias seen.  Coronary angiography 05/09/2020: LM: Normal LAD: Prox 20% calcific disease        Diag 2 ostial 50%, prox 40% stenoses LCx: No significant disease RCA: Distal RCA focal 40% stenosis   TIMI II flow in all vessels in spite of IC NTG suggests possible microvascular dysfunction    EKG 04/27/2020: Sinus rhythm 88 bpm Normal EKG  ABI 01/04/2020: This exam reveals normal perfusion of the right and left lower extremity  (ABI 1.00) with normal triphasic waveform pattern at the ankles.  Real time outpatient cardiac telemetry 06/25/2019: Dominant rhythm: Sinus. HR 52-149 bpm. Avg HR 94 bpm. Occasional ventricular and supraventricular ectopy seen. No atrial fibrillation/atrial flutter/SVT/VT/high grade AV block, sinus pause >3sec noted. Manually triggered events correlate with sinus rhythm/tachycardia.   CT Chest 06/15/2019: 1. No CT findings for pulmonary embolism. 2. Stable mild tortuosity and ectasia of the thoracic aorta but no aneurysm or dissection. 3. Stable three-vessel coronary artery calcifications. 4. Stable patchy airspace opacity in the right upper lobe, likely atelectasis or residual inflammation. 5. No mediastinal or hilar mass or adenopathy.  Lexiscan Tetrofosmin stress test 06/14/2019: No previous  exam available for comparison. Lexiscan nuclear stress test performed using 1-day protocol. Stress symptoms included chest pain, dyspnea, dizziness. Stress EKG is non-diagnostic, as this is pharmacological stress test. In additional, stress EKG shows sinus tachycardia at 83% MPHR, no ischemic changes. Myocardial perfusion imaging is normal. Stress LVEF 72% with normal myocardial thickening. Low risk study.   Echocardiogram 05/20/2019:  1. Left ventricular ejection fraction, by estimation, is 65 to 70%. The  left ventricle has hyperdynamic function. The left ventricle has no  regional wall motion abnormalities. Left ventricular diastolic parameters  are consistent with Grade I diastolic  dysfunction (impaired relaxation).   2. Right ventricular systolic function is normal. The right ventricular  size is normal.   3. The mitral valve is normal in structure and function. No evidence of  mitral valve regurgitation. No evidence of mitral stenosis.   4. The aortic valve is normal in structure and function. Aortic valve  regurgitation is not visualized. No aortic stenosis is present.   5. The inferior vena cava is normal in size with greater than 50%  respiratory variability,  suggesting right atrial pressure of 3 mmHg.   Recent labs: 11/16/2019: Glucose 97, BUN/Cr 9/0.59. EGFR >60. Na/K 140/3.9. Rest of the CMP normal H/H 10.8/33.2. MCV 77. Platelets 294  06/17/2019: Glucose 116, BUN/Cr 11/0.53. EGFR normal. Na/K 141/3.8. Albumin 3.3. Rest of the CMP normal H/H 11.7/34.1. MCV 83. Platelets 302 TG 270  2018: Chol 165, TG 166, HDL 33, LDL 99 TSH 1.5 normal    Review of Systems  Cardiovascular:  Positive for palpitations. Negative for chest pain, dyspnea on exertion, leg swelling and syncope.       Vitals:   10/20/20 0938  BP: (!) 134/92  Pulse: 78  Resp: 17  Temp: 97.7 F (36.5 C)  SpO2: 98%     Body mass index is 27.1 kg/m. Filed Weights   10/20/20 0938  Weight: 153 lb  (69.4 kg)     Objective:   Physical Exam Vitals and nursing note reviewed.  Constitutional:      Appearance: She is well-developed.  Neck:     Vascular: No JVD.  Cardiovascular:     Rate and Rhythm: Regular rhythm. Tachycardia present.     Pulses: Intact distal pulses.          Dorsalis pedis pulses are 0 on the right side and 0 on the left side.       Posterior tibial pulses are 1+ on the right side and 1+ on the left side.     Heart sounds: Normal heart sounds. No murmur heard. Pulmonary:     Effort: Pulmonary effort is normal.     Breath sounds: Normal breath sounds. No wheezing or rales.  Musculoskeletal:     Right lower leg: No edema.     Left lower leg: No edema.          Assessment & Recommendations:   55 y.o. African female with hypertension, h/o tobacco and alcohol dependence, coronary artery disease, suspected microvascular dysfunction  CAD with angina:  No obstructive CAD. Suspect microvascular dysfunction (Coronary angiography 04/2020) Constant chest and arm pain without any evidence of ischemia on EKG or troponin. Her pain could be noncardiac.  However, given her slow flow seen on coronary angiogram, microvascular dysfunction is a possibility.  I will change her amlodipine 10 mg to a nondiabetic 213 calcium channel blocker such as diltiazem 240 mg daily.  Resting heart rate is high enough, that she should tolerate concurrent use of beta-blocker and calcium channel blocker., likely so much absolutely Continue rest of the medical therapy. Currently on Imdur 60 mg daily, atenolol 100 mg daily. Continue rosuvastatin. Check lipid panel  Fu in 4 weeks   Kerrilynn Derenzo Esther Hardy, MD [facility] Pager: [phone] Office: [phone]

## 2020-10-24 DIAGNOSIS — I10 Essential (primary) hypertension: Secondary | ICD-10-CM | POA: Diagnosis not present

## 2020-10-24 DIAGNOSIS — K432 Incisional hernia without obstruction or gangrene: Secondary | ICD-10-CM | POA: Diagnosis not present

## 2020-10-24 DIAGNOSIS — Z720 Tobacco use: Secondary | ICD-10-CM | POA: Diagnosis not present

## 2020-10-24 DIAGNOSIS — M329 Systemic lupus erythematosus, unspecified: Secondary | ICD-10-CM | POA: Diagnosis not present

## 2020-10-24 DIAGNOSIS — I25118 Atherosclerotic heart disease of native coronary artery with other forms of angina pectoris: Secondary | ICD-10-CM | POA: Diagnosis not present

## 2020-10-26 DIAGNOSIS — I10 Essential (primary) hypertension: Secondary | ICD-10-CM | POA: Diagnosis not present

## 2020-10-26 DIAGNOSIS — L930 Discoid lupus erythematosus: Secondary | ICD-10-CM | POA: Diagnosis not present

## 2020-10-31 DIAGNOSIS — L930 Discoid lupus erythematosus: Secondary | ICD-10-CM | POA: Diagnosis not present

## 2020-10-31 DIAGNOSIS — I10 Essential (primary) hypertension: Secondary | ICD-10-CM | POA: Diagnosis not present

## 2020-10-31 DIAGNOSIS — K219 Gastro-esophageal reflux disease without esophagitis: Secondary | ICD-10-CM | POA: Diagnosis not present

## 2020-11-05 DIAGNOSIS — Z885 Allergy status to narcotic agent status: Secondary | ICD-10-CM | POA: Diagnosis not present

## 2020-11-05 DIAGNOSIS — T50904A Poisoning by unspecified drugs, medicaments and biological substances, undetermined, initial encounter: Secondary | ICD-10-CM | POA: Diagnosis not present

## 2020-11-05 DIAGNOSIS — R6889 Other general symptoms and signs: Secondary | ICD-10-CM | POA: Diagnosis not present

## 2020-11-05 DIAGNOSIS — Z955 Presence of coronary angioplasty implant and graft: Secondary | ICD-10-CM | POA: Diagnosis not present

## 2020-11-05 DIAGNOSIS — R079 Chest pain, unspecified: Secondary | ICD-10-CM | POA: Diagnosis not present

## 2020-11-05 DIAGNOSIS — Z743 Need for continuous supervision: Secondary | ICD-10-CM | POA: Diagnosis not present

## 2020-11-05 DIAGNOSIS — Z888 Allergy status to other drugs, medicaments and biological substances status: Secondary | ICD-10-CM | POA: Diagnosis not present

## 2020-11-05 DIAGNOSIS — R0789 Other chest pain: Secondary | ICD-10-CM | POA: Diagnosis not present

## 2020-11-05 DIAGNOSIS — F17210 Nicotine dependence, cigarettes, uncomplicated: Secondary | ICD-10-CM | POA: Diagnosis not present

## 2020-11-05 DIAGNOSIS — R404 Transient alteration of awareness: Secondary | ICD-10-CM | POA: Diagnosis not present

## 2020-11-05 DIAGNOSIS — I499 Cardiac arrhythmia, unspecified: Secondary | ICD-10-CM | POA: Diagnosis not present

## 2020-11-05 DIAGNOSIS — I10 Essential (primary) hypertension: Secondary | ICD-10-CM | POA: Diagnosis not present

## 2020-11-16 IMAGING — CT CT ABD-PELV W/ CM
2 of 5 series · 15 of 46 positions shown, 17 images · IV contrast (omnipaque)
Comparison: Multiple priors, most recent February 23, 2019

CLINICAL DATA: Abdominal distension, draining wound

EXAM:
CT ABDOMEN AND PELVIS WITH CONTRAST
TECHNIQUE: Multidetector CT imaging of the abdomen and pelvis was performed
using the standard protocol following bolus administration of
intravenous contrast.
CONTRAST:  100mL OMNIPAQUE IOHEXOL 300 MG/ML  SOLN

[Series 2: axial st · axial · 0.76mm/px · z∈[+933,+1328]mm · 12 of 91 slices shown, 14 images]
[im 6/91  soft-tissue]
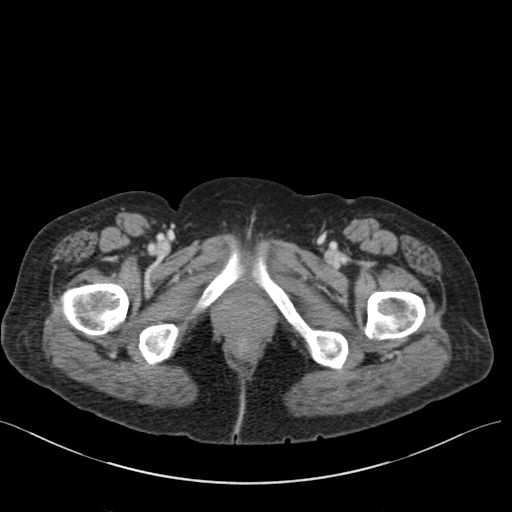
[im 6/91  bone]
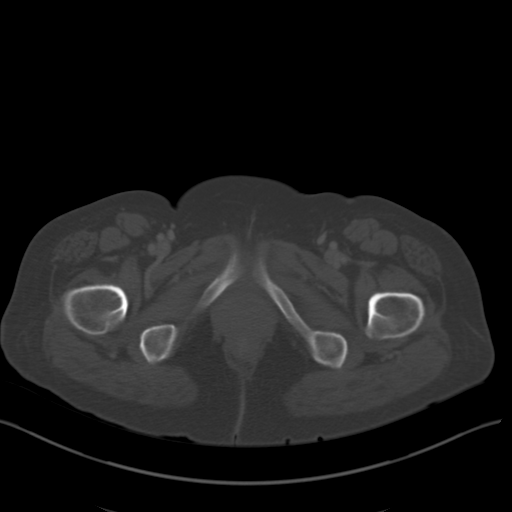
[im 12/91  soft-tissue]
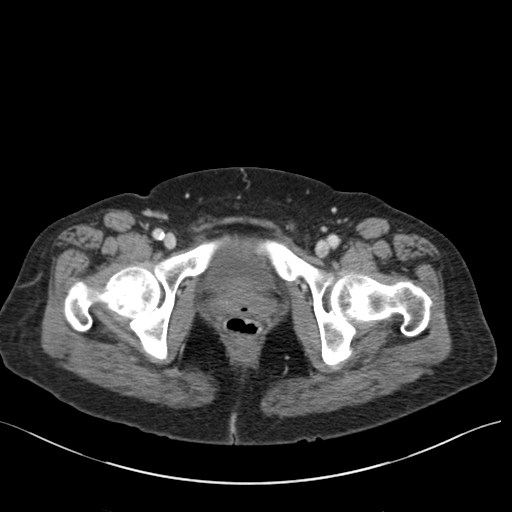
[im 23/91  soft-tissue]
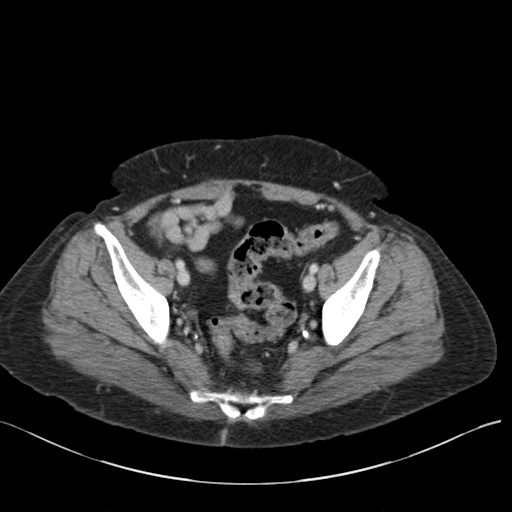
[im 29/91  soft-tissue]
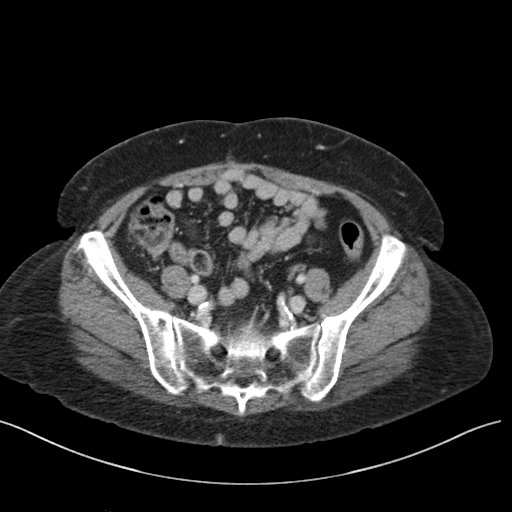
[im 34/91  soft-tissue]
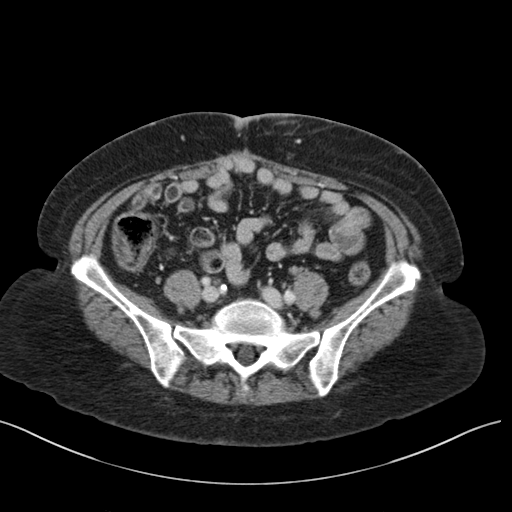
[im 40/91  soft-tissue]
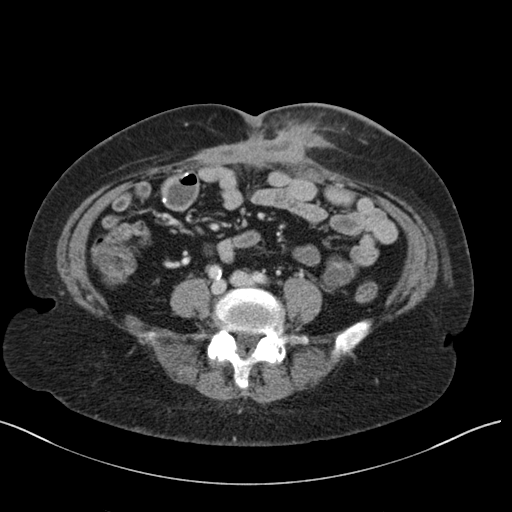
[im 51/91  soft-tissue]
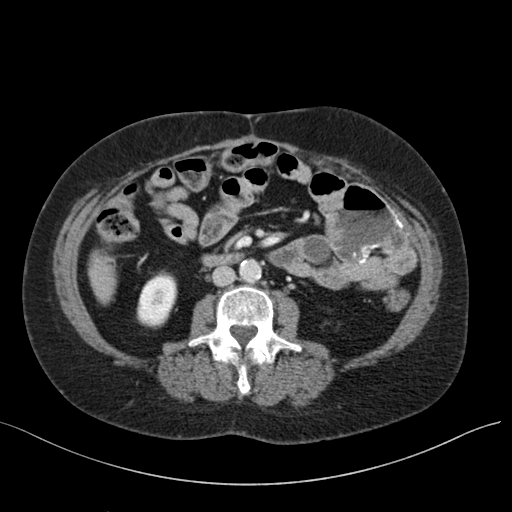
[im 57/91  soft-tissue]
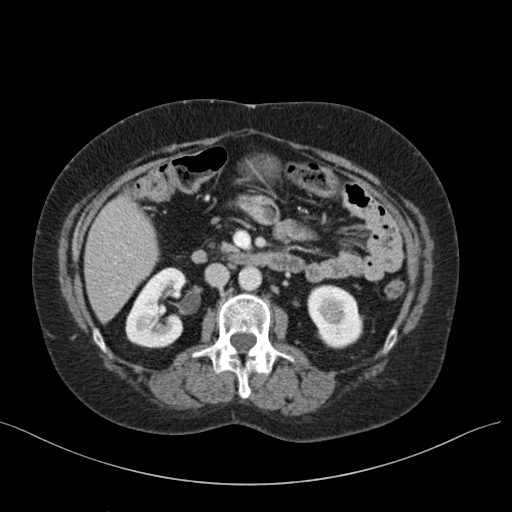
[im 62/91  soft-tissue]
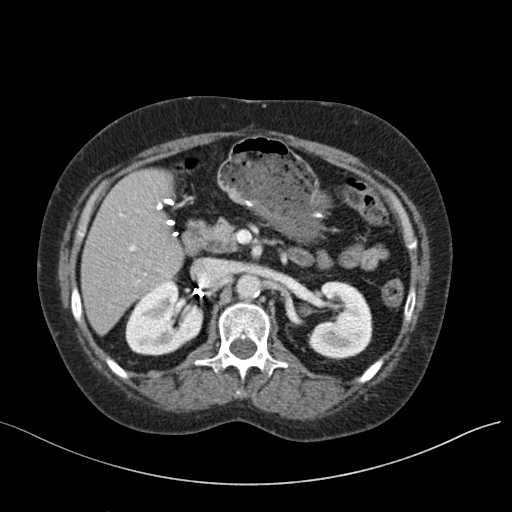
[im 62/91  bone]
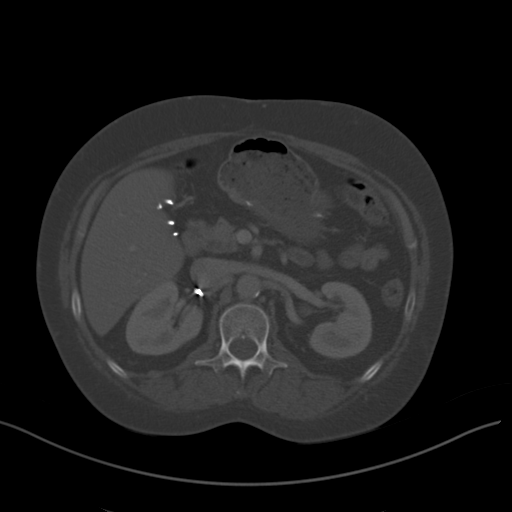
[im 68/91  soft-tissue]
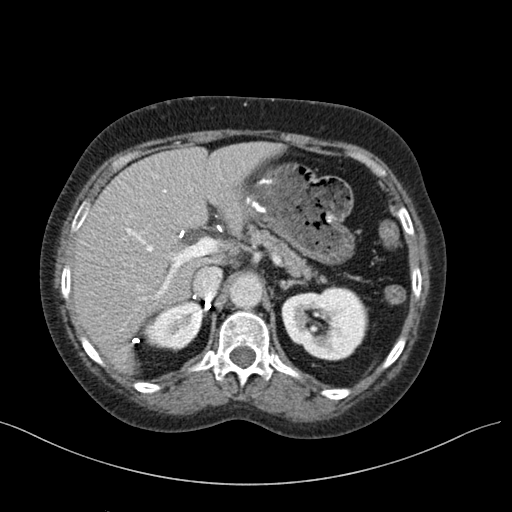
[im 79/91  soft-tissue]
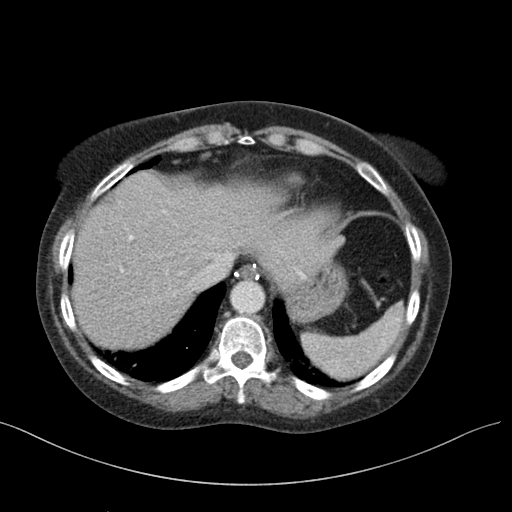
[im 85/91  soft-tissue]
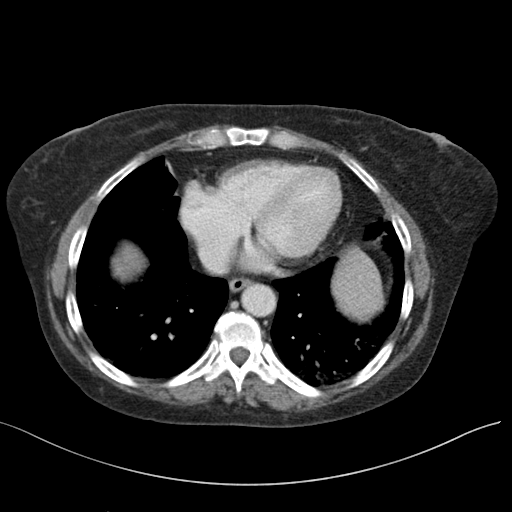

[Series 4: coronal st · coronal · 0.71mm/px · 3 of 151 slices shown]
[im 51/151  soft-tissue]
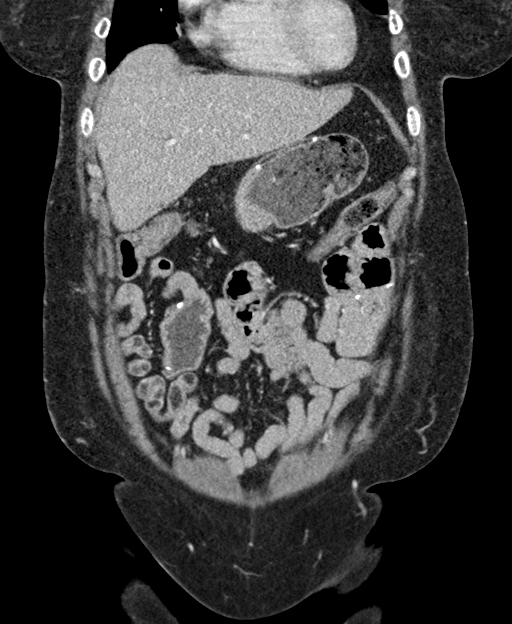
[im 67/151  soft-tissue]
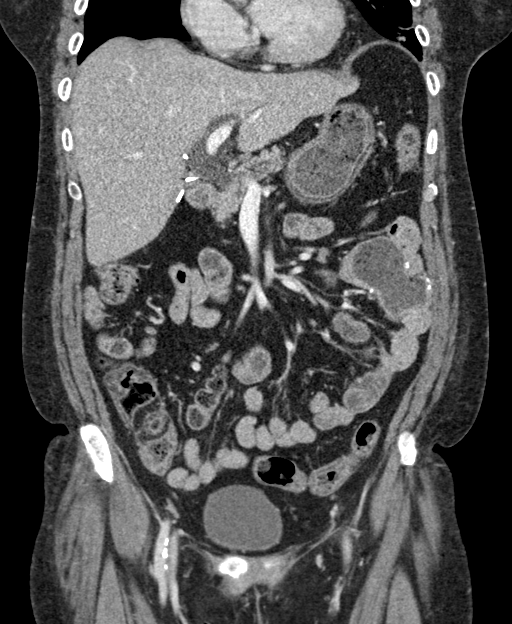
[im 84/151  soft-tissue]
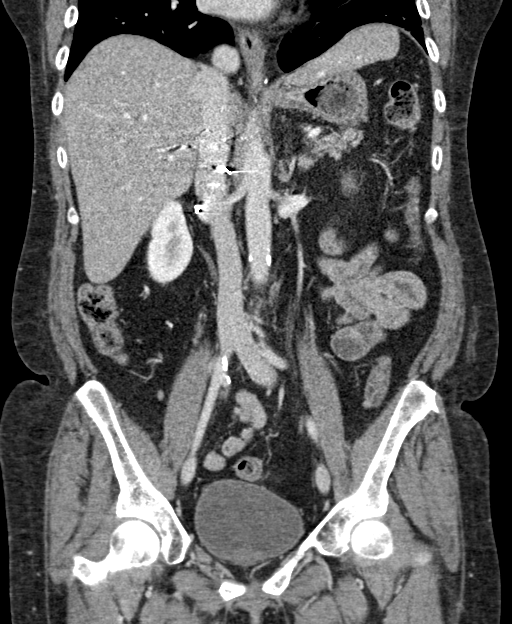

[15 of 46 positions shown; findings below may reference images not displayed]

FINDINGS: Lower chest: The visualized heart size within normal limits. No
pericardial fluid/thickening.

No hiatal hernia.

Streaky atelectasis seen at both lung bases.

Hepatobiliary: The liver is normal in density without focal
abnormality.The main portal vein is patent. The patient is status
post cholecystectomy. No biliary ductal dilation. Surgical clips
also noted along the posterior right liver lobe.

Pancreas: Unremarkable. No pancreatic ductal dilatation or
surrounding inflammatory changes.

Spleen: Normal in size without focal abnormality.

Adrenals/Urinary Tract: The patient is status post right
adrenalectomy. There is a stable low-density lesion within the left
adrenal gland measuring 1.7 cm. The kidneys and collecting system
appear normal without evidence of urinary tract calculus or
hydronephrosis. Bladder is unremarkable.

Stomach/Bowel: Again noted is a Nissen fundoplication. The patient
has had prior gastric colic anastomosis and iliac colic small bowel
anastomosis. There is stable mild wall thickening involving the
sigmoid colon. There is also mild wall thickening seen at the
terminal ileum. No surrounding fat stranding changes or loculated
fluid collections are seen.

Vascular/Lymphatic: There are no enlarged mesenteric,
retroperitoneal, or pelvic lymph nodes. Scattered aortic
atherosclerotic calcifications are seen without aneurysmal
dilatation.

Reproductive: The patient is status post hysterectomy. No adnexal
masses or collections seen.

Other: Again noted along the lower anterior abdominal wall is a
complex multilocular cystic collection measuring approximately 8.2 x
2.7 cm which appears to be present dating back to [DATE] likely
09/05/2018 and not significantly changed since then. There is
diffuse fat stranding changes and overlying skin thickening seen.
The collection extends to the deep fascial layer. No definite
intra-abdominal extension however is noted.

Musculoskeletal: No acute or significant osseous findings.
IMPRESSION: 1. Stable intra-abdominal postsurgical changes.
2. Mild wall thickening seen at the terminal ileum and sigmoid colon
which appear unchanged and could be due to chronic inflammatory
changes.
3. Stable anterior abdominal wall complex fluid collection with
overlying skin thickening and fat stranding changes.
4.  Aortic Atherosclerosis ([id number].F).

## 2020-11-23 ENCOUNTER — Ambulatory Visit: Payer: Medicare Other | Admitting: Cardiology

## 2020-11-23 NOTE — Progress Notes (Deleted)
Follow up visit  Subjective:   Angel Price, female    DOB: 02-19-1966, 55 y.o.   MRN: [id number]   No chief complaint on file.   55 y.o. African female with hypertension, h/o tobacco and alcohol dependence, coronary artery disease, suspected microvascular dysfunction  Coronary angiogram (04/2020) showed no obstructive disease, but slow flow likely due to microvascular dysfunction.  Patient has had multiple ED visits with chest pain. Records not available to me. However, one of the ED providers from [place] personally spoke with me yesteday and reported that she had been ruled out for ACS.  Patient has constant throbbing pain in her chest radiating to left arm, "like a toothache" She has no specific worsening with exertion, breathing, or specific arm movements.  Initial consultation HPI 06/2019: Patient has been hospitalized twice in last 6 weeks with complaints of loss of consciousness and questionable cardiac arrest.  Both times, patient reportedly had episodes of loss of consciousness, and underwent CPR by first responders, but had pulse present when EMS arrived.  At least 1 of these occasions, patient actually remembers chest compressions being performed.  One of these episodes happened while patient was Covid positive, fortunately without any severe illness related to it.  Both times, she has had elevated alcohol level.  She has also had complaints of unilateral weakness with CT head and MRI being unremarkable.  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   Given left main and triple-vessel calcifications noted, she underwent nuclear stress test which did not show any ischemia.  Echocardiogram showed structurally normal heart without any significant abnormalities.  Patient continues to have constant chest pain throughout the day, only improved when she sleeps at night.  Patient states that she has cut down her alcohol intake to about 1 beer per day.  She smokes 5  cigarettes/day.  In the past, patient used to have a sixpack every day.  She endorses having had too much to drink when she found out that her godmother passed off of it.  This is about the time she was admitted to the hospital and found to have alcohol level elevated.  Current Outpatient Medications on File Prior to Visit  Medication Sig Dispense Refill   acetaminophen (TYLENOL) 500 MG tablet Take 2 tablets (1,000 mg total) by mouth 3 (three) times daily as needed for mild pain or moderate pain. 30 tablet 0   aspirin EC 81 MG EC tablet Take 1 tablet (81 mg total) by mouth daily. 30 tablet 0   atenolol (TENORMIN) 100 MG tablet Take 1 tablet (100 mg total) by mouth daily. 60 tablet 2   diltiazem (CARDIZEM CD) 240 MG 24 hr capsule Take 1 capsule (240 mg total) by mouth daily. 30 capsule 3   escitalopram (LEXAPRO) 20 MG tablet Take 20 mg by mouth daily.     ibuprofen (ADVIL) 800 MG tablet Take 800 mg by mouth 2 (two) times daily as needed.     isosorbide mononitrate (IMDUR) 60 MG 24 hr tablet Take 0.5 tablets (30 mg total) by mouth daily. 90 tablet 3   mirtazapine (REMERON) 15 MG tablet Take 15 mg by mouth at bedtime.     nicotine polacrilex (NICORETTE) 4 MG gum Take 4 mg by mouth as needed.     nitroGLYCERIN (NITROSTAT) 0.4 MG SL tablet Place 1 tablet (0.4 mg total) under the tongue every 5 (five) minutes as needed for chest pain. (Patient taking differently: Place 0.4 mg under the tongue every  5 (five) minutes x 3 doses as needed for chest pain.) 30 tablet 3   ondansetron (ZOFRAN ODT) 4 MG disintegrating tablet Take 1 tablet (4 mg total) by mouth every 8 (eight) hours as needed for nausea or vomiting. 20 tablet 0   pantoprazole (PROTONIX) 20 MG tablet Take 1 tablet (20 mg total) by mouth daily. 30 tablet 0   prochlorperazine (COMPAZINE) 10 MG tablet Take 1 tablet (10 mg total) by mouth every 6 (six) hours as needed for nausea or vomiting. 20 tablet 0   promethazine (PHENERGAN) 12.5 MG tablet Take  12.5 mg by mouth every 6 (six) hours as needed for nausea or vomiting.      rosuvastatin (CRESTOR) 20 MG tablet Take 1 tablet (20 mg total) by mouth daily. (Patient taking differently: Take 20 mg by mouth at bedtime.) 90 tablet 3   No current facility-administered medications on file prior to visit.    Cardiovascular & other pertient studies:  EKG 10/20/2020: Sinus rhythm 73 bpm  Nonspecific T wave inverison anteroseptal leads  Cardiology monitoring reviewed 08/23/2020: Multiple episodes of sinus tachycardia up to 130 bpm correlating with patient symptoms of palpitations. No other tachyarrhythmias seen.  Coronary angiography 05/09/2020: LM: Normal LAD: Prox 20% calcific disease        Diag 2 ostial 50%, prox 40% stenoses LCx: No significant disease RCA: Distal RCA focal 40% stenosis   TIMI II flow in all vessels in spite of IC NTG suggests possible microvascular dysfunction    EKG 04/27/2020: Sinus rhythm 88 bpm Normal EKG  ABI 01/04/2020: This exam reveals normal perfusion of the right and left lower extremity  (ABI 1.00) with normal triphasic waveform pattern at the ankles.  Real time outpatient cardiac telemetry 06/25/2019: Dominant rhythm: Sinus. HR 52-149 bpm. Avg HR 94 bpm. Occasional ventricular and supraventricular ectopy seen. No atrial fibrillation/atrial flutter/SVT/VT/high grade AV block, sinus pause >3sec noted. Manually triggered events correlate with sinus rhythm/tachycardia.   CT Chest 06/15/2019: 1. No CT findings for pulmonary embolism. 2. Stable mild tortuosity and ectasia of the thoracic aorta but no aneurysm or dissection. 3. Stable three-vessel coronary artery calcifications. 4. Stable patchy airspace opacity in the right upper lobe, likely atelectasis or residual inflammation. 5. No mediastinal or hilar mass or adenopathy.  Lexiscan Tetrofosmin stress test 06/14/2019: No previous exam available for comparison. Lexiscan nuclear stress test performed  using 1-day protocol. Stress symptoms included chest pain, dyspnea, dizziness. Stress EKG is non-diagnostic, as this is pharmacological stress test. In additional, stress EKG shows sinus tachycardia at 83% MPHR, no ischemic changes. Myocardial perfusion imaging is normal. Stress LVEF 72% with normal myocardial thickening. Low risk study.   Echocardiogram 05/20/2019:  1. Left ventricular ejection fraction, by estimation, is 65 to 70%. The  left ventricle has hyperdynamic function. The left ventricle has no  regional wall motion abnormalities. Left ventricular diastolic parameters  are consistent with Grade I diastolic  dysfunction (impaired relaxation).   2. Right ventricular systolic function is normal. The right ventricular  size is normal.   3. The mitral valve is normal in structure and function. No evidence of  mitral valve regurgitation. No evidence of mitral stenosis.   4. The aortic valve is normal in structure and function. Aortic valve  regurgitation is not visualized. No aortic stenosis is present.   5. The inferior vena cava is normal in size with greater than 50%  respiratory variability, suggesting right atrial pressure of 3 mmHg.   Recent labs: 11/16/2019: Glucose 97, BUN/Cr  9/0.59. EGFR >60. Na/K 140/3.9. Rest of the CMP normal H/H 10.8/33.2. MCV 77. Platelets 294  06/17/2019: Glucose 116, BUN/Cr 11/0.53. EGFR normal. Na/K 141/3.8. Albumin 3.3. Rest of the CMP normal H/H 11.7/34.1. MCV 83. Platelets 302 TG 270  2018: Chol 165, TG 166, HDL 33, LDL 99 TSH 1.5 normal    Review of Systems  Cardiovascular:  Positive for palpitations. Negative for chest pain, dyspnea on exertion, leg swelling and syncope.       There were no vitals filed for this visit.    There is no height or weight on file to calculate BMI. There were no vitals filed for this visit.    Objective:   Physical Exam Vitals and nursing note reviewed.  Constitutional:      Appearance: She is  well-developed.  Neck:     Vascular: No JVD.  Cardiovascular:     Rate and Rhythm: Regular rhythm. Tachycardia present.     Pulses: Intact distal pulses.          Dorsalis pedis pulses are 0 on the right side and 0 on the left side.       Posterior tibial pulses are 1+ on the right side and 1+ on the left side.     Heart sounds: Normal heart sounds. No murmur heard. Pulmonary:     Effort: Pulmonary effort is normal.     Breath sounds: Normal breath sounds. No wheezing or rales.  Musculoskeletal:     Right lower leg: No edema.     Left lower leg: No edema.          Assessment & Recommendations:   55 y.o. African female with hypertension, h/o tobacco and alcohol dependence, coronary artery disease, suspected microvascular dysfunction  *** CAD with angina:  No obstructive CAD. Suspect microvascular dysfunction (Coronary angiography 04/2020) Constant chest and arm pain without any evidence of ischemia on EKG or troponin. **Her pain could be noncardiac.  However, given her slow flow seen on coronary angiogram, microvascular dysfunction is a possibility.  I will change her amlodipine 10 mg to a non-DHP calcium channel blocker such as diltiazem 240 mg daily.  Resting heart rate is high enough, that she should tolerate concurrent use of beta-blocker and calcium channel blocker., likely so much absolutely Continue rest of the medical therapy. Currently on Imdur 60 mg daily, atenolol 100 mg daily. Continue rosuvastatin. Check lipid panel  Fu in 4 weeks   Syra Sirmons Esther Hardy, MD [facility] Pager: [phone] Office: [phone]

## 2020-11-26 ENCOUNTER — Emergency Department (HOSPITAL_COMMUNITY)
Admission: EM | Admit: 2020-11-26 | Discharge: 2020-11-26 | Disposition: A | Discharge status: Home / Self Care | Payer: Medicare Other | Attending: Emergency Medicine | Admitting: Emergency Medicine

## 2020-11-26 ENCOUNTER — Emergency Department (HOSPITAL_COMMUNITY): Payer: Medicare Other

## 2020-11-26 ENCOUNTER — Encounter (HOSPITAL_COMMUNITY): Payer: Self-pay

## 2020-11-26 ENCOUNTER — Other Ambulatory Visit: Payer: Self-pay

## 2020-11-26 DIAGNOSIS — Z79899 Other long term (current) drug therapy: Secondary | ICD-10-CM | POA: Diagnosis not present

## 2020-11-26 DIAGNOSIS — Z7982 Long term (current) use of aspirin: Secondary | ICD-10-CM | POA: Diagnosis not present

## 2020-11-26 DIAGNOSIS — M7989 Other specified soft tissue disorders: Secondary | ICD-10-CM

## 2020-11-26 DIAGNOSIS — I10 Essential (primary) hypertension: Secondary | ICD-10-CM | POA: Insufficient documentation

## 2020-11-26 DIAGNOSIS — Z8616 Personal history of COVID-19: Secondary | ICD-10-CM | POA: Insufficient documentation

## 2020-11-26 DIAGNOSIS — F17210 Nicotine dependence, cigarettes, uncomplicated: Secondary | ICD-10-CM | POA: Diagnosis not present

## 2020-11-26 DIAGNOSIS — I25118 Atherosclerotic heart disease of native coronary artery with other forms of angina pectoris: Secondary | ICD-10-CM | POA: Diagnosis not present

## 2020-11-26 DIAGNOSIS — R2232 Localized swelling, mass and lump, left upper limb: Secondary | ICD-10-CM | POA: Insufficient documentation

## 2020-11-26 DIAGNOSIS — M79602 Pain in left arm: Secondary | ICD-10-CM

## 2020-11-26 MED ORDER — PREDNISONE 20 MG PO TABS: 40.0000 mg | ORAL_TABLET | Freq: Every day | ORAL | 0 refills | Status: DC

## 2020-11-26 MED ORDER — HYDROCODONE-ACETAMINOPHEN 5-325 MG PO TABS
1.0000 | ORAL_TABLET | Freq: Once | ORAL | Status: AC
  Administered 2020-11-26: 1 via ORAL
  Filled 2020-11-26: qty 1

## 2020-11-26 NOTE — ED Provider Notes (Signed)
At change of shift care was signed out from Dr. Roxanne Mins, the patient is pending an ultrasound of her arm which is negative and normal.  This patient does have a history of lupus, she has some mild swelling from her elbow down to her wrist.  She has no weakness in the arm, no numbness in the arm and normal pulses.  On my exam she does have a very slight appearance of mild edema to the hand wrist and forearm but there is no redness, there is no induration there is no signs of breaks in the skin and there is no increased warmth.  She has mild tenderness with range of motion of the wrist, with flexion of the elbow however she is able to fully pronate and supinate without any difficulty.  Her grip is normal but limited by pain.  Vital signs are normal, ultrasound is negative for DVT, will treat with a short course of prednisone and have follow-up with family doctor, the patient is agreeable and understands indications for return.   Angel Chapel, MD 11/26/20 1009

## 2020-11-26 NOTE — ED Triage Notes (Signed)
Pt arrived via POV c/o Left arm pain et swelling10/10

## 2020-11-26 NOTE — ED Provider Notes (Signed)
[facility] EMERGENCY DEPARTMENT Provider Note   CSN: [id number] Arrival date & time: 11/26/20  [id number]     History Chief Complaint  Patient presents with   Arm Pain    Angel Price is a 55 y.o. female.  The history is provided by the patient.  Arm Pain She has history of hypertension, hyperlipidemia lupus and comes in because of pain and swelling of her left arm.  She woke up this morning with pain and swelling diffusely throughout her left arm.  She denies any trauma or unusual activity prior to this.  Pain is rated at 10/10.  She has never had this happen before.  There has been no treatment at home.   Past Medical History:  Diagnosis Date   Abscess    soft tissue   Adrenal mass ([place])    Alcohol abuse    Anxiety    Blood transfusion without reported diagnosis    Chronic abdominal pain    Chronic wound infection of abdomen    Colon polyp    colonoscopy 04/2014   Depression    Diverticulosis    colonoscopy 04/2014 moderat pan colonic   Drug-seeking behavior    Gastritis    EGD 05/2014   Gastroparesis Nov 2015   GERD (gastroesophageal reflux disease)    Hemorrhoid    internal large   Hiatal hernia    History of Billroth II operation    Hypertension    Lung nodule    CT 02/2014 needs repeat 1 month   Lung nodule < 6cm on CT 04/25/2014   Lupus ([place])    Malingering    Nausea and vomiting    chronic, recurrent   Pancreatitis    Schatzki's ring    patent per EGD 04/2014   Sickle cell trait ([place])    Suicide attempt ([place])    Thyroid disease 2000   overactive, radiation    Patient Active Problem List   Diagnosis Date Noted   Mixed hyperlipidemia 04/27/2020   Pain in both lower extremities 12/29/2019   Coronary artery disease of native artery of native heart with stable angina pectoris ([place]) 06/25/2019   Shortness of breath 06/15/2019   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   Acute respiratory failure with hypoxia ([place]) 05/20/2019   Essential hypertension  05/20/2019   COVID-19 05/20/2019   Syncope and collapse 05/20/2019   Right sided weakness 0000000   Metabolic acidosis 0000000   Intractable vomiting with nausea 02/23/2019   Abnormal CT scan, colon 12/09/2018   Abnormal computed tomography of cecum and terminal ileum 12/09/2018   Cocaine abuse ([place])    Colitis 11/11/2018   Hemorrhoidal skin tag    SBO (small bowel obstruction) (H[facility])    Non-intractable vomiting with nausea    Folate deficiency 05/11/2017   Abdominal wall fistula    Cellulitis of abdominal wall 01/02/2017   Acute left hemiparesis ([place]) 12/05/2016   Malingering 12/05/2016   Weakness of left lower extremity 12/02/2016   CVA (cerebral vascular accident) ([place]) 11/23/2016   Bright red rectal bleeding 08/22/2016   Hypotension due to blood loss    Acute GI bleeding 05/24/2016   Dieulafoy lesion of duodenum    History of Billroth II operation    Gastrointestinal hemorrhage 05/22/2016   Absolute anemia    Acute pancreatitis 10/01/2015   Abnormal CT scan of lung 10/01/2015   Alcohol abuse with intoxication ([place])    Left-sided weakness    Psychosomatic factor in physical condition    Upper  GI bleed    Diverticulosis of colon with hemorrhage    Chronic wound infection of abdomen 12/22/2014   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   HTN (hypertension) 12/22/2014   Ataxia 11/01/2014   Hemorrhoids 04/26/2014   Lung nodule 04/26/2014   Diverticulosis    Gastritis    Hiatal hernia    Schatzki's ring    Acute blood loss anemia 04/25/2014   Sinus tachycardia 04/25/2014   Hypokalemia 04/25/2014   Hematemesis with nausea 04/25/2014   Lung nodule < 6cm on CT 04/25/2014   Intractable nausea and vomiting 04/24/2014   Gastroparesis    Abdominal pain 04/01/2014   Chronic abdominal pain 01/21/2014   Gastroenteritis 12/10/2013   Chronic abdominal wound infection 08/16/2013   MDD (major depressive disorder), recurrent episode, severe ([place]) 06/27/2013   Wrist laceration  06/24/2013   Diarrhea 05/07/2013   Rectal bleeding 05/07/2013   Abnormal LFTs 05/07/2013   Adrenal mass, left ([place]) 05/07/2013   Abdominal wall abscess at site of surgical wound 04/19/2013   Abdominal wall abscess 04/18/2013   Frequent headaches 03/30/2013   Sleep difficulties 03/30/2013   Essential hypertension, benign 03/30/2013   History of cocaine abuse ([place]) 03/16/2013   History of schizoaffective disorder 03/16/2013   Bipolar disorder ([place]) 03/16/2013   Personality disorder ([place]) 03/16/2013   Tobacco abuse 03/16/2013   Alcohol abuse 03/16/2013   Palpitations 03/16/2013   Poor vision 03/16/2013   History of gastric bypass 03/16/2013   Status post hysterectomy with oophorectomy 03/16/2013   Hypothyroid 03/16/2013   Lupus ([place]) 03/16/2013    Past Surgical History:  Procedure Laterality Date   ABDOMINAL HYSTERECTOMY  2013   [place]   ABDOMINAL HYSTERECTOMY     ABDOMINAL SURGERY     ADRENALECTOMY Right    AGILE CAPSULE N/A 01/05/2015   Procedure: AGILE CAPSULE;  Surgeon: Daneil Dolin, MD;  Location: [facility];  Service: Endoscopy;  Laterality: N/A;  0700   Billroth II procedure      [place], first 2000, 2005/2006.   bilroth 2     BIOPSY  05/20/2013   Procedure: BIOPSIES OF ASCENDING AND SIGMOID COLON;  Surgeon: Daneil Dolin, MD;  Location: [facility] OR[facility];  Service: Endoscopy;;   BIOPSY  04/26/2014   Procedure: BIOPSIES;  Surgeon: Danie Binder, MD;  Location: [facility];  Service: Endoscopy;;   BIOPSY  12/24/2018   Procedure: BIOPSY;  Surgeon: Daneil Dolin, MD;  Location[facility];  Service: Endoscopy;;  colon   CHOLECYSTECTOMY     COLONOSCOPY     in [place]   COLONOSCOPY WITH PROPOFOL N/A 05/20/2013   Dr.Rourk- inadequate prep, normal appearing rectum, grossly normal colon aside from pancolonic diverticula, normal terminal ileum bx= unremarkable colonic mucosa. Due for early interval 2016.    COLONOSCOPY WITH PROPOFOL N/A 04/26/2014   [id number] ileum/one colon polyp  removed/moderate pan-colonic diverticulosis/large internal hemorrhoids   COLONOSCOPY WITH PROPOFOL N/A 12/23/2014   Dr.Rourk- minimal internal hemorrhoids, pancolonic diverticulosis   COLONOSCOPY WITH PROPOFOL N/A 08/23/2016   Procedure: COLONOSCOPY WITH PROPOFOL;  Surgeon: Danie Binder, MD;  [facility];  Service: Endoscopy;  Laterality: N/A;   COLONOSCOPY WITH PROPOFOL N/A 12/24/2018   Pancolonic diverticulosis, normal TI, one 5 mm polyp in rectum (tubular adenoma). Somewhat friable hemorrhagic mucosa in area of IC valve/cecum, possibly related to scope trauma s/p biopsy.    DEBRIDEMENT OF ABDOMINAL WALL ABSCESS N/A 02/08/2013   Procedure: DEBRIDEMENT OF ABDOMINAL WALL ABSCESS;  Surgeon: Jamesetta So, MD;  Location: [facility];  Service: General;  Laterality: N/A;   ESOPHAGOGASTRODUODENOSCOPY (EGD) WITH PROPOFOL N/A 05/20/2013   Dr.Rourk- s/p prior gastric surgery with normal esophagus, residual gastric mucosa and patent efferent limb   ESOPHAGOGASTRODUODENOSCOPY (EGD) WITH PROPOFOL N/A 02/03/2014   Dr. Gala Romney:  s/p hemigastrectomy with retained gastric contents. Residual gastric mucosa and efferent limb appeared normal otherwise. Query gastroparesis.    ESOPHAGOGASTRODUODENOSCOPY (EGD) WITH PROPOFOL N/A 04/26/2014   [id number] ring/small HH/mild non-erosive gasrtitis/normal anastomosis   ESOPHAGOGASTRODUODENOSCOPY (EGD) WITH PROPOFOL N/A 12/23/2014   Dr.Rourk- s/p prior hemigastrctomy, active oozing from anastomotic suture site, hemostasis achieved   ESOPHAGOGASTRODUODENOSCOPY (EGD) WITH PROPOFOL N/A 05/23/2016   Dr. 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    ESOPHAGOGASTRODUODENOSCOPY (EGD) WITH PROPOFOL N/A 05/11/2017   Procedure: ESOPHAGOGASTRODUODENOSCOPY (EGD) WITH PROPOFOL;  Surgeon: Daneil Dolin, MD;  [facility];  Service: Endoscopy;  Laterality: N/A;   EXCISION OF MESH   08/2019   HEMORRHOID SURGERY N/A 06/18/2017   Procedure: THREE COLUMN EXTENSIVE HEMORRHOIDECTOMY;  Surgeon: Virl Cagey, MD;  Location: [facility];  Service: General;  Laterality: N/A;   INCISION AND DRAINAGE ABSCESS N/A 01/06/2017   Procedure: INCISION AND DRAINAGE ABDOMINAL WALL ABSCESS;  Surgeon: Aviva Signs, MD;  Location: [facility];  Service: General;  Laterality: N/A;   LEFT HEART CATH AND CORONARY ANGIOGRAPHY N/A 05/09/2020   Procedure: LEFT HEART CATH AND CORONARY ANGIOGRAPHY;  Surgeon: Nigel Mormon, MD;  Location: [place] [place] [facility];  Service: Cardiovascular;  Laterality: N/A;   POLYPECTOMY  12/24/2018   Procedure: POLYPECTOMY;  Surgeon: Daneil Dolin, MD;  [facility];  Service: Endoscopy;;  colon   tendon repar Right    wrist   WOUND EXPLORATION Right 06/24/2013   Procedure: exploration of traumatic wound right wrist;  Surgeon: Tennis Must, MD;  Location: [place];  Service: Orthopedics;  Laterality: Right;     OB History     Gravida  4   Para  4   Term  4   Preterm  0   AB  0   Living         SAB  0   IAB  0   Ectopic  0   Multiple      Live Births              Family History  Problem Relation Age of Onset   Lung cancer Father    Cancer Father        mets   Brain cancer Son    Schizophrenia Son    Cancer Son        brain   Drug abuse Mother    Breast cancer Maternal Aunt    Bipolar disorder Maternal Aunt    Drug abuse Maternal Aunt    Colon cancer Maternal Grandmother        late 42s, early 66s   Drug abuse Sister    Drug abuse Brother    Bipolar disorder Paternal Grandfather    Bipolar disorder Cousin    Liver disease Neg Hx     Social History   Tobacco Use   Smoking status: Some Days    Packs/day: 0.25    Years: 35.00    Pack years: 8.75    Types: Cigarettes   Smokeless tobacco: Never  Vaping Use   Vaping Use: Never used  Substance Use Topics   Alcohol use:  Not Currently   Drug use: Not Currently     Types: Cocaine    Home Medications Prior to Admission medications   Medication Sig Start Date End Date Taking? Authorizing Provider  acetaminophen (TYLENOL) 500 MG tablet Take 2 tablets (1,000 mg total) by mouth 3 (three) times daily as needed for mild pain or moderate pain. 02/26/19   Enzo Bi, MD  aspirin EC 81 MG EC tablet Take 1 tablet (81 mg total) by mouth daily. 05/19/19   Elgergawy, Silver Huguenin, MD  atenolol (TENORMIN) 100 MG tablet Take 1 tablet (100 mg total) by mouth daily. 08/23/20   Patwardhan, Reynold Bowen, MD  diltiazem (CARDIZEM CD) 240 MG 24 hr capsule Take 1 capsule (240 mg total) by mouth daily. 10/20/20 01/18/21  Patwardhan, Reynold Bowen, MD  escitalopram (LEXAPRO) 20 MG tablet Take 20 mg by mouth daily. 04/05/20   [provider]  ibuprofen (ADVIL) 800 MG tablet Take 800 mg by mouth 2 (two) times daily as needed. 08/09/20   [provider]  isosorbide mononitrate (IMDUR) 60 MG 24 hr tablet Take 0.5 tablets (30 mg total) by mouth daily. 05/31/20 10/20/20  Patwardhan, Reynold Bowen, MD  mirtazapine (REMERON) 15 MG tablet Take 15 mg by mouth at bedtime. 04/22/20   [provider]  nicotine polacrilex (NICORETTE) 4 MG gum Take 4 mg by mouth as needed. 09/12/20   [provider]  nitroGLYCERIN (NITROSTAT) 0.4 MG SL tablet Place 1 tablet (0.4 mg total) under the tongue every 5 (five) minutes as needed for chest pain. Patient taking differently: Place 0.4 mg under the tongue every 5 (five) minutes x 3 doses as needed for chest pain. 12/29/19 10/20/20  Patwardhan, Reynold Bowen, MD  ondansetron (ZOFRAN ODT) 4 MG disintegrating tablet Take 1 tablet (4 mg total) by mouth every 8 (eight) hours as needed for nausea or vomiting. 08/30/20   Horton, Barbette Hair, MD  pantoprazole (PROTONIX) 20 MG tablet Take 1 tablet (20 mg total) by mouth daily. 11/07/19   Couture, Cortni S, PA-C  prochlorperazine (COMPAZINE) 10 MG tablet Take 1 tablet (10 mg total) by mouth every 6 (six) hours as needed for  nausea or vomiting. 0000000   Delora Fuel, MD  promethazine (PHENERGAN) 12.5 MG tablet Take 12.5 mg by mouth every 6 (six) hours as needed for nausea or vomiting.  12/18/19   [provider]  rosuvastatin (CRESTOR) 20 MG tablet Take 1 tablet (20 mg total) by mouth daily. Patient taking differently: Take 20 mg by mouth at bedtime. 06/25/19 11/06/28  Nigel Mormon, MD    Allergies    Metoclopramide and Reglan [metoclopramide]  Review of Systems   Review of Systems  All other systems reviewed and are negative.  Physical Exam Updated Vital Signs BP (!) 149/103 (BP Location: Right Arm)   Pulse 86   Temp 98.1 F (36.7 C) (Oral)   Resp 18   Ht '5\' 3"'$  (1.6 m)   Wt 72.1 kg   LMP  (LMP Unknown)   SpO2 99%   BMI 28.17 kg/m   Physical Exam Vitals and nursing note reviewed.  55 year old female, resting comfortably and in no acute distress. Vital signs are significant for elevated blood pressure. Oxygen saturation is 99%, which is normal. Head is normocephalic and atraumatic. PERRLA, EOMI. Oropharynx is clear. Neck is nontender and supple without adenopathy or JVD. Back is nontender and there is no CVA tenderness. Lungs are clear without rales, wheezes, or rhonchi. Chest is nontender. Heart  has regular rate and rhythm without murmur. Abdomen is soft, flat, nontender without masses or hepatosplenomegaly and peristalsis is normoactive. Extremities: There is generalized swelling of the left arm which is more prominent in the forearm and hand.  There is tenderness palpation rather diffusely but no focal area of tenderness.  There are no palpable cords.  There is pain on active and passive range of motion.  Radial pulses strong, capillary refill is prompt.  There is normal sensation.  Although she is weak because of pain, she is able to use all the muscles of her left arm. Skin is warm and dry without rash. Neurologic: Mental status is normal, cranial nerves are intact, there are no  motor or sensory deficits.  ED Results / Procedures / Treatments    Radiology No results found.  Procedures Procedures   Medications Ordered in ED Medications  HYDROcodone-acetaminophen (NORCO/VICODIN) 5-325 MG per tablet 1 tablet (1 tablet Oral Given 11/26/20 [id number])    ED Course  I have reviewed the triage vital signs and the nursing notes.  Pertinent labs & imaging results that were available during my care of the patient were reviewed by me and considered in my medical decision making (see chart for details).    MDM Rules/Calculators/A&P                         Pain and swelling of the left arm concerning for possible DVT.  History of lupus but no history of prior DVT.  Venous ultrasound has been ordered.  Old records are reviewed, and she has no relevant past visits.  Case is signed out to Dr. Sabra Heck.  Final Clinical Impression(s) / ED Diagnoses Final diagnoses:  Left arm pain  Left arm swelling  Elevated blood pressure reading with diagnosis of hypertension    Rx / DC Orders ED Discharge Orders     None        Delora Fuel, MD [id number] [phone]

## 2020-11-26 NOTE — Discharge Instructions (Signed)
It is unclear why her arm is swollen however there is no blood clot, if you do develop increasing pain swelling redness or fever return to the emergency department immediately.  Otherwise please take prednisone daily for 5 days.

## 2020-11-26 NOTE — ED Notes (Signed)
+   left radial pulse, Capillary refill to left fingers < 3 seconds

## 2020-12-01 DIAGNOSIS — I10 Essential (primary) hypertension: Secondary | ICD-10-CM | POA: Diagnosis not present

## 2020-12-01 DIAGNOSIS — K5790 Diverticulosis of intestine, part unspecified, without perforation or abscess without bleeding: Secondary | ICD-10-CM | POA: Diagnosis not present

## 2020-12-07 ENCOUNTER — Ambulatory Visit: Payer: Medicare Other | Admitting: Internal Medicine

## 2020-12-08 ENCOUNTER — Encounter (INDEPENDENT_AMBULATORY_CARE_PROVIDER_SITE_OTHER): Payer: Self-pay

## 2020-12-08 ENCOUNTER — Ambulatory Visit: Payer: Self-pay

## 2020-12-08 ENCOUNTER — Ambulatory Visit: Payer: Medicare Other | Admitting: Internal Medicine

## 2020-12-08 ENCOUNTER — Encounter: Payer: Self-pay | Admitting: Internal Medicine

## 2020-12-08 ENCOUNTER — Other Ambulatory Visit: Payer: Self-pay

## 2020-12-08 VITALS — BP 129/95 | HR 109 | Ht 63.0 in | Wt 149.2 lb

## 2020-12-08 DIAGNOSIS — M25562 Pain in left knee: Secondary | ICD-10-CM

## 2020-12-08 DIAGNOSIS — M79642 Pain in left hand: Secondary | ICD-10-CM

## 2020-12-08 DIAGNOSIS — M329 Systemic lupus erythematosus, unspecified: Secondary | ICD-10-CM

## 2020-12-08 DIAGNOSIS — M25561 Pain in right knee: Secondary | ICD-10-CM

## 2020-12-08 DIAGNOSIS — M79605 Pain in left leg: Secondary | ICD-10-CM

## 2020-12-08 DIAGNOSIS — M25522 Pain in left elbow: Secondary | ICD-10-CM | POA: Diagnosis not present

## 2020-12-08 DIAGNOSIS — M79604 Pain in right leg: Secondary | ICD-10-CM

## 2020-12-08 NOTE — Progress Notes (Signed)
Office Visit Note  Patient: Angel Price             Date of Birth: [birth year]           MRN: [id number]             PCP: Rosita Fire, MD Referring: Rosita Fire, MD Visit Date: 12/08/2020  Subjective:  New Patient (Initial Visit) (Patient complains of left upper extremity pain and bilateral lower extremity weakness and pain.)   History of Present Illness: Angel Price is a 55 y.o. female here for lupus. 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 Previous serology showed positive ANA and RF. Past imaging showing degenerative changes in lumbar spine.  She has not followed up with any rheumatologist for many years. She recalls use of NSAIDs and steroids and thinks plaquenil sounds familiar but not sure of an other long term lupus treatment. 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  She is currently following with cardiology for angina loss of consciousness no obstructive CAD on extensive work-up including coronary angiography attributed to suspected microvascular dysfunction. She is required abdominal surgeries for gastrointestinal bleeding with dieulafoy lesion of duodenum.  Abdominal mesh surgery for repair of incisional hernia was complicated with infection attributed in part to continue nicotine abuse.   Activities of Daily Living:  Patient reports morning stiffness for 30 minutes.   Patient Reports nocturnal pain.  Difficulty dressing/grooming: Reports Difficulty climbing stairs: Reports Difficulty getting out of chair: Reports Difficulty using hands for taps, buttons, cutlery, and/or writing: Reports  Review of Systems  Constitutional:  Positive for fatigue.  HENT:  Negative for mouth sores, mouth dryness and nose dryness.   Eyes:  Positive for pain, itching and  visual disturbance. Negative for dryness.  Respiratory:  Negative for cough, hemoptysis, shortness of breath and difficulty breathing.   Cardiovascular:  Positive for chest pain, palpitations and swelling in legs/feet.  Gastrointestinal:  Positive for diarrhea. Negative for abdominal pain, blood in stool and constipation.  Endocrine: Negative for increased urination.  Genitourinary:  Negative for painful urination.  Musculoskeletal:  Positive for joint pain, joint pain, joint swelling, myalgias, muscle weakness, morning stiffness, muscle tenderness and myalgias.  Skin:  Positive for rash. Negative for color change and redness.  Allergic/Immunologic: Positive for susceptible to infections.  Neurological:  Positive for dizziness, numbness and memory loss. Negative for headaches and weakness.  Hematological:  Negative for swollen glands.  Psychiatric/Behavioral:  Positive for confusion and sleep disturbance.    PMFS History:  Patient Active Problem List   Diagnosis Date Noted   Tobacco dependence 09/12/2020   Continuous dependence on cigarette smoking 08/22/2020   Mixed hyperlipidemia 04/27/2020   Recurrent incisional hernia 02/03/2020   Pain in both lower extremities 12/29/2019   Coronary artery disease of native artery of native heart with stable angina pectoris ([place]) 06/25/2019   Shortness of breath 06/15/2019   Microvascular angina ([place]) 06/15/2019   Acute respiratory failure with hypoxia ([place]) 05/20/2019   Essential hypertension 05/20/2019   COVID-19 05/20/2019   Syncope and collapse 05/20/2019   Right sided weakness 50/35/4656   Metabolic acidosis 81/27/5170   Intractable vomiting with nausea 02/23/2019   Abnormal CT scan, colon 12/09/2018   Abnormal computed tomography of cecum and terminal ileum 12/09/2018   Cocaine abuse ([place])    Colitis 11/11/2018  Hemorrhoidal skin tag    SBO (small bowel obstruction) (HCC)    Non-intractable vomiting with nausea    Folate deficiency  05/11/2017   Abdominal wall fistula    Cellulitis of abdominal wall 01/02/2017   Acute left hemiparesis ([place]) 12/05/2016   Malingering 12/05/2016   Weakness of left lower extremity 12/02/2016   CVA (cerebral vascular accident) ([place]) 11/23/2016   Bright red rectal bleeding 08/22/2016   Hypotension due to blood loss    Acute GI bleeding 05/24/2016   Dieulafoy lesion of duodenum    History of Billroth II operation    Gastrointestinal hemorrhage 05/22/2016   Absolute anemia    Acute pancreatitis 10/01/2015   Abnormal CT scan of lung 10/01/2015   Alcohol abuse with intoxication ([place])    Left-sided weakness    Psychosomatic factor in physical condition    Upper GI bleed    Diverticulosis of colon with hemorrhage    Chronic wound infection of abdomen 12/22/2014   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   HTN (hypertension) 12/22/2014   Ataxia 11/01/2014   Hemorrhoids 04/26/2014   Lung nodule 04/26/2014   Diverticulosis    Gastritis    Hiatal hernia    Schatzki's ring    Acute blood loss anemia 04/25/2014   Sinus tachycardia 04/25/2014   Hypokalemia 04/25/2014   Hematemesis with nausea 04/25/2014   Lung nodule < 6cm on CT 04/25/2014   Intractable nausea and vomiting 04/24/2014   Gastroparesis    Abdominal pain 04/01/2014   Chronic abdominal pain 01/21/2014   Gastroenteritis 12/10/2013   Chronic abdominal wound infection 08/16/2013   MDD (major depressive disorder), recurrent episode, severe ([place]) 06/27/2013   Wrist laceration 06/24/2013   Diarrhea 05/07/2013   Rectal bleeding 05/07/2013   Abnormal LFTs 05/07/2013   Adrenal mass, left ([place]) 05/07/2013   Abdominal wall abscess at site of surgical wound 04/19/2013   Abdominal wall abscess 04/18/2013   Frequent headaches 03/30/2013   Sleep difficulties 03/30/2013   Essential hypertension, benign 03/30/2013   History of cocaine abuse ([place]) 03/16/2013   History of schizoaffective disorder 03/16/2013   Bipolar disorder ([place]) 03/16/2013    Personality disorder ([place]) 03/16/2013   Tobacco abuse 03/16/2013   Alcohol abuse 03/16/2013   Palpitations 03/16/2013   Poor vision 03/16/2013   History of gastric bypass 03/16/2013   Status post hysterectomy with oophorectomy 03/16/2013   Hypothyroid 03/16/2013   Lupus ([place]) 03/16/2013    Past Medical History:  Diagnosis Date   Abscess    soft tissue   Adrenal mass ([place])    Alcohol abuse    Anxiety    Blood transfusion without reported diagnosis    Chronic abdominal pain    Chronic wound infection of abdomen    Colon polyp    colonoscopy 04/2014   Depression    Diverticulosis    colonoscopy 04/2014 moderat pan colonic   Drug-seeking behavior    Gastritis    EGD 05/2014   Gastroparesis Nov 2015   GERD (gastroesophageal reflux disease)    Hemorrhoid    internal large   Hiatal hernia    History of Billroth II operation    Hypertension    Lung nodule    CT 02/2014 needs repeat 1 month   Lung nodule < 6cm on CT 04/25/2014   Lupus (H[facility])    Malingering    Nausea and vomiting    chronic, recurrent   Pancreatitis    Schatzki's ring    patent per EGD 04/2014   Sickle  cell trait ([place])    Suicide attempt ([place])    Thyroid disease 2000   overactive, radiation    Family History  Problem Relation Age of Onset   Drug abuse Mother    Lung cancer Father    Cancer Father        mets   Drug abuse Sister    Drug abuse Brother    Breast cancer Maternal Aunt    Bipolar disorder Maternal Aunt    Drug abuse Maternal Aunt    Colon cancer Maternal Grandmother        late 62s, early 37s   Bipolar disorder Paternal Grandfather    Brain cancer Son    Schizophrenia Son    Cancer Son        brain   Bipolar disorder Cousin    Liver disease Neg Hx    Past Surgical History:  Procedure Laterality Date   ABDOMINAL HYSTERECTOMY  2013   [place]   ABDOMINAL HYSTERECTOMY     ABDOMINAL SURGERY     ADRENALECTOMY Right    AGILE CAPSULE N/A 01/05/2015   Procedure: AGILE CAPSULE;   Surgeon: Daneil Dolin, MD;  [facility];  Service: Endoscopy;  Laterality: N/A;  0700   Billroth II procedure      [place], first 2000, 2005/2006.   bilroth 2     BIOPSY  05/20/2013   Procedure: BIOPSIES OF ASCENDING AND SIGMOID COLON;  Surgeon: Daneil Dolin, MD;  Location: [facility];  Service: Endoscopy;;   BIOPSY  04/26/2014   Procedure: BIOPSIES;  Surgeon: Danie Binder, MD;  Location: [facility];  Service: Endoscopy;;   BIOPSY  12/24/2018   Procedure: BIOPSY;  Surgeon: Daneil Dolin, MD;  [facility];  Service: Endoscopy;;  colon   CHOLECYSTECTOMY     COLONOSCOPY     in [place]   COLONOSCOPY WITH PROPOFOL N/A 05/20/2013   Dr.Rourk- inadequate prep, normal appearing rectum, grossly normal colon aside from pancolonic diverticula, normal terminal ileum bx= unremarkable colonic mucosa. Due for early interval 2016.    COLONOSCOPY WITH PROPOFOL N/A 04/26/2014   OBS:[id number] ileum/one colon polyp removed/moderate pan-colonic diverticulosis/large internal hemorrhoids   COLONOSCOPY WITH PROPOFOL N/A 12/23/2014   Dr.Rourk- minimal internal hemorrhoids, pancolonic diverticulosis   COLONOSCOPY WITH PROPOFOL N/A 08/23/2016   Procedure: COLONOSCOPY WITH PROPOFOL;  Surgeon: Danie Binder, MD;  [facility];  Service: Endoscopy;  Laterality: N/A;   COLONOSCOPY WITH PROPOFOL N/A 12/24/2018   Pancolonic diverticulosis, normal TI, one 5 mm polyp in rectum (tubular adenoma). Somewhat friable hemorrhagic mucosa in area of IC valve/cecum, possibly related to scope trauma s/p biopsy.    DEBRIDEMENT OF ABDOMINAL WALL ABSCESS N/A 02/08/2013   Procedure: DEBRIDEMENT OF ABDOMINAL WALL ABSCESS;  Surgeon: Jamesetta So, MD;  Location: [facility];  Service: General;  Laterality: N/A;   ESOPHAGOGASTRODUODENOSCOPY (EGD) WITH PROPOFOL N/A 05/20/2013   Dr.Rourk- s/p prior gastric surgery with normal esophagus, residual gastric mucosa and patent efferent limb   ESOPHAGOGASTRODUODENOSCOPY (EGD) WITH  PROPOFOL N/A 02/03/2014   Dr. Gala Romney:  s/p hemigastrectomy with retained gastric contents. Residual gastric mucosa and efferent limb appeared normal otherwise. Query gastroparesis.    ESOPHAGOGASTRODUODENOSCOPY (EGD) WITH PROPOFOL N/A 04/26/2014   OQH:[id number] ring/small HH/mild non-erosive gasrtitis/normal anastomosis   ESOPHAGOGASTRODUODENOSCOPY (EGD) WITH PROPOFOL N/A 12/23/2014   Dr.Rourk- s/p prior hemigastrctomy, active oozing from anastomotic suture site, hemostasis achieved   ESOPHAGOGASTRODUODENOSCOPY (EGD) WITH PROPOFOL N/A 05/23/2016   Dr. Oneida Alar while inpatient: red  blood at anastomosis, s/p epi injection and clips, likely secondary to Dieulafoy's lesion at anastomosis    ESOPHAGOGASTRODUODENOSCOPY (EGD) WITH PROPOFOL N/A 05/11/2017   Procedure: ESOPHAGOGASTRODUODENOSCOPY (EGD) WITH PROPOFOL;  Surgeon: Daneil Dolin, MD;  [facility];  Service: Endoscopy;  Laterality: N/A;   EXCISION OF MESH  08/2019   HEMORRHOID SURGERY N/A 06/18/2017   Procedure: THREE COLUMN EXTENSIVE HEMORRHOIDECTOMY;  Surgeon: Virl Cagey, MD;  Location: [facility];  Service: General;  Laterality: N/A;   INCISION AND DRAINAGE ABSCESS N/A 01/06/2017   Procedure: INCISION AND DRAINAGE ABDOMINAL WALL ABSCESS;  Surgeon: Aviva Signs, MD;  Location: [facility];  Service: General;  Laterality: N/A;   LEFT HEART CATH AND CORONARY ANGIOGRAPHY N/A 05/09/2020   Procedure: LEFT HEART CATH AND CORONARY ANGIOGRAPHY;  Surgeon: Nigel Mormon, MD;  Location: [facility];  Service: Cardiovascular;  Laterality: N/A;   POLYPECTOMY  12/24/2018   Procedure: POLYPECTOMY;  Surgeon: Daneil Dolin, MD;  [facility];  Service: Endoscopy;;  colon   tendon repar Right    wrist   WOUND EXPLORATION Right 06/24/2013   Procedure: exploration of traumatic wound right wrist;  Surgeon: Tennis Must, MD;  Location: [facility];  Service: Orthopedics;  Laterality: Right;   Social History   Social History Narrative    ** Merged History Encounter **       Immunization History  Administered Date(s) Administered   Influenza,inj,Quad PF,6+ Mos 02/08/2013, 04/02/2014, 12/23/2014, 12/03/2016   Influenza-Unspecified 02/08/2013, 04/02/2014, 12/23/2014, 12/03/2016   Pneumococcal Polysaccharide-23 06/25/2013, 12/03/2016, 11/12/2018   Tdap 03/16/2013, 06/24/2013     Objective: Vital Signs: BP (!) 129/95 (BP Location: Right Arm, Patient Position: Sitting, Cuff Size: Normal)   Pulse (!) 109   Ht 5\' 3"  (1.6 m)   Wt 149 lb 3.2 oz (67.7 kg)   LMP  (LMP Unknown)   BMI 26.43 kg/m    Physical Exam Constitutional:      Appearance: She is obese.  HENT:     Right Ear: External ear normal.     Left Ear: External ear normal.     Mouth/Throat:     Mouth: Mucous membranes are moist.     Pharynx: Oropharynx is clear.  Eyes:     Conjunctiva/sclera: Conjunctivae normal.  Cardiovascular:     Rate and Rhythm: Regular rhythm. Tachycardia present.  Pulmonary:     Effort: Pulmonary effort is normal.     Breath sounds: Normal breath sounds.  Musculoskeletal:     Right lower leg: No edema.     Left lower leg: No edema.  Skin:    General: Skin is warm and dry.     Comments: Dry and peeling skin on bilateral soles of feet  Neurological:     Mental Status: She is alert.     Deep Tendon Reflexes: Reflexes normal.  Psychiatric:        Mood and Affect: Mood normal.     Musculoskeletal Exam:  Neck full ROM no tenderness Shoulders full ROM no tenderness or swelling Left elbow swelling, tender, erythematous, decreased extension ROM, right elbow normal Left wrist tenderness and swelling, slightly decreased ROM, right wrist normal Left 2nd-3rd MCPs slightly swollen with tenderness, end finger decreased flexion ROM, rest normal Knees full ROM no tenderness or swelling Ankles full ROM no tenderness or swelling   CDAI Exam: CDAI Score: -- Patient Global: --; Provider Global: -- Swollen: 4 ; Tender: 4  Joint Exam  12/08/2020  Right  Left  Elbow     Swollen Tender  Wrist     Swollen Tender  MCP 2     Swollen Tender  MCP 3     Swollen Tender     Investigation: No additional findings.  Imaging: US Venous Img Upper Uni Left  Result Date: 11/26/2020 CLINICAL DATA:  Left upper extremity pain and swelling for 1 day EXAM: LEFT UPPER EXTREMITY VENOUS DOPPLER ULTRASOUND TECHNIQUE: Gray-scale sonography with graded compression, as well as color Doppler and duplex ultrasound were performed to evaluate the upper extremity deep venous system from the level of the subclavian vein and including the jugular, axillary, basilic, radial, ulnar and upper cephalic vein. Spectral Doppler was utilized to evaluate flow at rest and with distal augmentation maneuvers. COMPARISON:  None. FINDINGS: Contralateral Subclavian Vein: Respiratory phasicity is normal and symmetric with the symptomatic side. No evidence of thrombus. Normal compressibility. Internal Jugular Vein: No evidence of thrombus. Normal compressibility, respiratory phasicity and response to augmentation. Subclavian Vein: No evidence of thrombus. Normal compressibility, respiratory phasicity and response to augmentation. Axillary Vein: No evidence of thrombus. Normal compressibility, respiratory phasicity and response to augmentation. Cephalic Vein: No evidence of thrombus. Normal compressibility, respiratory phasicity and response to augmentation. Basilic Vein: No evidence of thrombus. Normal compressibility, respiratory phasicity and response to augmentation. Brachial Veins: No evidence of thrombus. Normal compressibility, respiratory phasicity and response to augmentation. Radial Veins: No evidence of thrombus. Normal compressibility, respiratory phasicity and response to augmentation. Ulnar Veins: No evidence of thrombus. Normal compressibility, respiratory phasicity and response to augmentation. Venous Reflux:  None visualized. Other Findings:  None visualized.  IMPRESSION: No evidence of DVT within the left upper extremity. Electronically Signed   By: Jacqulynn Cadet M.D.   On: 11/26/2020 10:12   XR Elbow 2 Views Left  Result Date: 12/08/2020 X-ray left elbow 2 views Normal-appearing elbow x-ray no significant joint space loss osteophytes or erosions.  XR Hand 2 View Left  Result Date: 12/08/2020 X-ray left hand 2 views Radiocarpal joint space appears normal.  Mild degenerative changes around first [facility] joint.  MCP and PIP joints appear normal.  Mild degenerative change of DIPs visible at second digit most.  Bone mineralization suggestive for generalized osteopenia. Impression Mild osteoarthritis, no erosive disease generalized osteopenia could indicate chronic inflammatory process or possibly low bone density systemically  XR KNEE 3 VIEW LEFT  Result Date: 12/08/2020 X-ray left knee 3 views Normal appearance of the joint all 3 compartments.  Small posterior calcification visible in lateral view.  Linear opacities in distal femur and proximal tibia consistent with growth lines.  No significant effusion visible. Impression Normal-appearing knee x-ray  XR KNEE 3 VIEW RIGHT  Result Date: 12/08/2020 X-ray right knee 3 views Normal knee joint appearance and all 3 compartments.  Linear opacities in the distal femur partial thickness in AP view probable growth lines.  No significant visible effusion. Impression Normal-appearing knee x-ray   Recent Labs: Lab Results  Component Value Date   WBC 14.0 (H) 10/14/2020   HGB 13.1 10/14/2020   PLT 441 (H) 10/14/2020   NA 137 10/14/2020   K 3.5 10/14/2020   CL 105 10/14/2020   CO2 19 (L) 10/14/2020   GLUCOSE 87 10/14/2020   BUN 10 10/14/2020   CREATININE 0.61 10/14/2020   BILITOT 0.6 10/14/2020   ALKPHOS 82 10/14/2020   AST 24 10/14/2020   ALT 22 10/14/2020   PROT 7.7 10/14/2020   ALBUMIN 4.0 10/14/2020   CALCIUM 9.0  10/14/2020   GFRAA 119 05/05/2020    Speciality Comments: No specialty comments  available.  Procedures:  No procedures performed Allergies: Metoclopramide and Reglan [metoclopramide]   Assessment / Plan:     Visit Diagnoses: Lupus ([place]) - Plan: XR Hand 2 View Left, XR Elbow 2 Views Left, XR KNEE 3 VIEW RIGHT, XR KNEE 3 VIEW LEFT, ANA, Anti-DNA antibody, double-stranded, C3 and C4, Sjogrens syndrome-A extractable nuclear antibody, Anti-Smith antibody, RNP Antibody, Sedimentation rate, C-reactive protein, Rheumatoid factor  History of lupus apparently onset since 1990s with some inflammatory changes initially labeled as undifferentiated connective tissue disease.  Reported features including rash, alopecia, arthritis, and oral ulcers.  She has chronic scarring alopecia on the scalp and hyperpigmented rashes appearing on the head and back sounds consistent with chronic cutaneous lupus.  Current joint inflammation is evident in the left upper extremity she reports this is continuous since past 2 weeks.  No recent work-up we will check serology for systemic lupus at this time.  We will get x-rays of the left hand and elbow for any evidence of erosive disease.  Plan to follow-up discussed options for DMARD treatment.  Pain in both lower extremities   Bilateral knee pain with some falls and instability within the past 2 weeks no current swelling or inflammation notable on exam.  Unclear cause could be deconditioning could be chronic injury or could be inflammatory arthritis activity.  Does not have obvious or reported neurologic deficits.  Checking bilateral knee x-rays.  Orders: Orders Placed This Encounter  Procedures   XR Hand 2 View Left   XR Elbow 2 Views Left   XR KNEE 3 VIEW RIGHT   XR KNEE 3 VIEW LEFT   ANA   Anti-DNA antibody, double-stranded   C3 and C4   Sjogrens syndrome-A extractable nuclear antibody   Anti-Smith antibody   RNP Antibody   Sedimentation rate   C-reactive protein   Rheumatoid factor    No orders of the defined types were placed in this  encounter.    Follow-Up Instructions: Return in about 2 weeks (around 12/22/2020) for New pt f/u 2wks.   Collier Salina, MD  Note - This record has been created using [employer].  Chart creation errors have been sought, but may not always  have been located. Such creation errors do not reflect on  the standard of medical care.

## 2020-12-11 LAB — ANTI-NUCLEAR AB-TITER (ANA TITER): ANA Titer 1: 1:80 {titer} — ABNORMAL HIGH

## 2020-12-11 LAB — RHEUMATOID FACTOR: Rheumatoid fact SerPl-aCnc: 25 IU/mL — ABNORMAL HIGH (ref <14)

## 2020-12-11 LAB — ANA: Anti Nuclear Antibody (ANA): POSITIVE — AB

## 2020-12-11 LAB — C-REACTIVE PROTEIN: CRP: 0.4 mg/L (ref <8.0)

## 2020-12-11 LAB — RNP ANTIBODY: Ribonucleic Protein(ENA) Antibody, IgG: <1 AI

## 2020-12-11 LAB — SJOGRENS SYNDROME-A EXTRACTABLE NUCLEAR ANTIBODY: SSA (Ro) (ENA) Antibody, IgG: <1 AI

## 2020-12-11 LAB — SEDIMENTATION RATE: Sed Rate: 2 mm/h (ref 0–30)

## 2020-12-11 LAB — C3 AND C4
C3 Complement: 122 mg/dL (ref 83–193)
C4 Complement: 14 mg/dL — ABNORMAL LOW (ref 15–57)

## 2020-12-11 LAB — ANTI-SMITH ANTIBODY: ENA SM Ab Ser-aCnc: <1 AI

## 2020-12-11 LAB — ANTI-DNA ANTIBODY, DOUBLE-STRANDED: ds DNA Ab: <1 IU/mL

## 2020-12-28 ENCOUNTER — Ambulatory Visit (INDEPENDENT_AMBULATORY_CARE_PROVIDER_SITE_OTHER): Payer: Medicare Other | Admitting: Internal Medicine

## 2020-12-28 ENCOUNTER — Other Ambulatory Visit: Payer: Self-pay

## 2020-12-28 ENCOUNTER — Encounter: Payer: Self-pay | Admitting: *Deleted

## 2020-12-28 ENCOUNTER — Telehealth: Payer: Self-pay | Admitting: Internal Medicine

## 2020-12-28 ENCOUNTER — Encounter: Payer: Self-pay | Admitting: Internal Medicine

## 2020-12-28 VITALS — BP 165/112 | HR 71 | Ht 63.0 in | Wt 156.2 lb

## 2020-12-28 DIAGNOSIS — M329 Systemic lupus erythematosus, unspecified: Secondary | ICD-10-CM

## 2020-12-28 MED ORDER — HYDROXYCHLOROQUINE SULFATE 200 MG PO TABS: 200.0000 mg | ORAL_TABLET | Freq: Every day | ORAL | 2 refills | Status: DC

## 2020-12-28 NOTE — Telephone Encounter (Signed)
I called patient, appt note at front desk.

## 2020-12-28 NOTE — Progress Notes (Signed)
Office Visit Note  Patient: Angel Price             Date of Birth: 08-06-1965           MRN: [id number]             PCP: Rosita Fire, MD Referring: Rosita Fire, MD Visit Date: 12/28/2020   Subjective:   History of Present Illness: Angel Price is a 55 y.o. female here for follow up for lupus with several ongoing symptoms but most recently having joint pain with some swelling on left elbow wrist and MCPs were seen on exam. Laboratory work-up was consistent with lupus with positive rheumatoid factor slightly low serum complement.  No changes or events since her last visit.   Previous HPI 12/08/20 Angel Price is a 55 y.o. female here for lupus. 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 Previous serology showed positive ANA and RF. Past imaging showing degenerative changes in lumbar spine.  She has not followed up with any rheumatologist for many years. She recalls use of NSAIDs and steroids and thinks plaquenil sounds familiar but not sure of an other long term lupus treatment. 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  She is currently following with cardiology for angina loss of consciousness no obstructive CAD on extensive work-up including coronary angiography attributed to suspected microvascular dysfunction. She is required abdominal surgeries for gastrointestinal bleeding with dieulafoy lesion of duodenum.  Abdominal mesh surgery for repair of incisional hernia was complicated with infection attributed in part to continue nicotine abuse.   Review of Systems  Constitutional:  Positive for fatigue.  HENT:  Positive for mouth dryness. Negative for mouth sores and nose dryness.   Eyes:  Positive for itching. Negative for pain and dryness.  Respiratory:  Positive for  shortness of breath. Negative for difficulty breathing.   Cardiovascular:  Positive for palpitations. Negative for irregular heartbeat.  Gastrointestinal:  Negative for blood in stool, constipation and diarrhea.  Endocrine: Positive for increased urination.  Genitourinary:  Negative for difficulty urinating and painful urination.  Musculoskeletal:  Positive for joint pain, joint pain, joint swelling, myalgias, morning stiffness, muscle tenderness and myalgias.  Skin:  Negative for color change, rash and redness.  Allergic/Immunologic: Negative for susceptible to infections.  Neurological:  Positive for dizziness, numbness, memory loss and weakness. Negative for headaches.  Hematological:  Positive for bruising/bleeding tendency.  Psychiatric/Behavioral:  Positive for confusion.    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[place]) 06/15/2019   Acute respiratory failure with hypoxia ([place]) 05/20/2019   Essential hypertension 05/20/2019   COVID-19 05/20/2019   Syncope and collapse 05/20/2019   Right sided weakness 40/98/1191   Metabolic acidosis 47/82/9562   Intractable vomiting with nausea 02/23/2019   Abnormal CT scan, colon 12/09/2018   Abnormal computed tomography of cecum and terminal ileum 12/09/2018   Cocaine abuse ([place])    Colitis 11/11/2018   Hemorrhoidal skin tag  SBO (small bowel obstruction) (HCC)    Non-intractable vomiting with nausea    Folate deficiency 05/11/2017   Abdominal wall fistula    Cellulitis of abdominal wall 01/02/2017   Acute left hemiparesis ([place]) 12/05/2016    Malingering 12/05/2016   Weakness of left lower extremity 12/02/2016   CVA (cerebral vascular accident) ([place]) 11/23/2016   Bright red rectal bleeding 08/22/2016   Hypotension due to blood loss    Acute GI bleeding 05/24/2016   Dieulafoy lesion of duodenum    History of Billroth II operation    Gastrointestinal hemorrhage 05/22/2016   Absolute anemia    Acute pancreatitis 10/01/2015   Abnormal CT scan of lung 10/01/2015   Alcohol abuse with intoxication ([place])    Left-sided weakness    Psychosomatic factor in physical condition    Upper GI bleed    Diverticulosis of colon with hemorrhage    Chronic wound infection of abdomen 12/22/2014   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   HTN (hypertension) 12/22/2014   Ataxia 11/01/2014   Hemorrhoids 04/26/2014   Lung nodule 04/26/2014   Diverticulosis    Gastritis    Hiatal hernia    Schatzki's ring    Acute blood loss anemia 04/25/2014   Sinus tachycardia 04/25/2014   Hypokalemia 04/25/2014   Hematemesis with nausea 04/25/2014   Lung nodule < 6cm on CT 04/25/2014   Intractable nausea and vomiting 04/24/2014   Gastroparesis    Abdominal pain 04/01/2014   Chronic abdominal pain 01/21/2014   Gastroenteritis 12/10/2013   Chronic abdominal wound infection 08/16/2013   MDD (major depressive disorder), recurrent episode, severe ([place]) 06/27/2013   Wrist laceration 06/24/2013   Diarrhea 05/07/2013   Rectal bleeding 05/07/2013   Abnormal LFTs 05/07/2013   Adrenal mass, left ([place]) 05/07/2013   Abdominal wall abscess at site of surgical wound 04/19/2013   Abdominal wall abscess 04/18/2013   Frequent headaches 03/30/2013   Sleep difficulties 03/30/2013   Essential hypertension, benign 03/30/2013   History of cocaine abuse ([place]) 03/16/2013   History of schizoaffective disorder 03/16/2013   Bipolar disorder ([place]) 03/16/2013   Personality disorder ([place]) 03/16/2013   Tobacco abuse 03/16/2013   Alcohol abuse 03/16/2013   Palpitations 03/16/2013    Poor vision 03/16/2013   History of gastric bypass 03/16/2013   Status post hysterectomy with oophorectomy 03/16/2013   Hypothyroid 03/16/2013   Lupus ([place]) 03/16/2013    Past Medical History:  Diagnosis Date   Abscess    soft tissue   Adrenal mass ([place])    Alcohol abuse    Anxiety    Blood transfusion without reported diagnosis    Chronic abdominal pain    Chronic wound infection of abdomen    Colon polyp    colonoscopy 04/2014   Depression    Diverticulosis    colonoscopy 04/2014 moderat pan colonic   Drug-seeking behavior    Gastritis    EGD 05/2014   Gastroparesis Nov 2015   GERD (gastroesophageal reflux disease)    Hemorrhoid    internal large   Hiatal hernia    History of Billroth II operation    Hypertension    Lung nodule    CT 02/2014 needs repeat 1 month   Lung nodule < 6cm on CT 04/25/2014   Lupus (HCC)    Malingering    Nausea and vomiting    chronic, recurrent   Pancreatitis    Schatzki's ring    patent per EGD 04/2014   Sickle cell trait ([place])  Suicide attempt [facility])    Thyroid disease 2000   overactive, radiation    Family History  Problem Relation Age of Onset   Drug abuse Mother    Lung cancer Father    Cancer Father        mets   Drug abuse Sister    Drug abuse Brother    Breast cancer Maternal Aunt    Bipolar disorder Maternal Aunt    Drug abuse Maternal Aunt    Colon cancer Maternal Grandmother        late 34s, early 32s   Bipolar disorder Paternal Grandfather    Brain cancer Son    Schizophrenia Son    Cancer Son        brain   Bipolar disorder Cousin    Liver disease Neg Hx    Past Surgical History:  Procedure Laterality Date   ABDOMINAL HYSTERECTOMY  2013   [place]   ABDOMINAL HYSTERECTOMY     ABDOMINAL SURGERY     ADRENALECTOMY Right    AGILE CAPSULE N/A 01/05/2015   Procedure: AGILE CAPSULE;  Surgeon: Daneil Dolin, MD;  [facility];  Service: Endoscopy;  Laterality: N/A;  0700   Billroth II procedure       [place], first 2000, 2005/2006.   bilroth 2     BIOPSY  05/20/2013   Procedure: BIOPSIES OF ASCENDING AND SIGMOID COLON;  Surgeon: Daneil Dolin, MD;  Location: [facility];  Service: Endoscopy;;   BIOPSY  04/26/2014   Procedure: BIOPSIES;  Surgeon: Danie Binder, MD;  Location: [facility];  Service: Endoscopy;;   BIOPSY  12/24/2018   Procedure: BIOPSY;  Surgeon: Daneil Dolin, MD;  [facility];  Service: Endoscopy;;  colon   CHOLECYSTECTOMY     COLONOSCOPY     in [place]   COLONOSCOPY WITH PROPOFOL N/A 05/20/2013   Dr.Rourk- inadequate prep, normal appearing rectum, grossly normal colon aside from pancolonic diverticula, normal terminal ileum bx= unremarkable colonic mucosa. Due for early interval 2016.    COLONOSCOPY WITH PROPOFOL N/A 04/26/2014   OIP:PGFQMK ileum/one colon polyp removed/moderate pan-colonic diverticulosis/large internal hemorrhoids   COLONOSCOPY WITH PROPOFOL N/A 12/23/2014   Dr.Rourk- minimal internal hemorrhoids, pancolonic diverticulosis   COLONOSCOPY WITH PROPOFOL N/A 08/23/2016   Procedure: COLONOSCOPY WITH PROPOFOL;  Surgeon: Danie Binder, MD;  [facility];  Service: Endoscopy;  Laterality: N/A;   COLONOSCOPY WITH PROPOFOL N/A 12/24/2018   Pancolonic diverticulosis, normal TI, one 5 mm polyp in rectum (tubular adenoma). Somewhat friable hemorrhagic mucosa in area of IC valve/cecum, possibly related to scope trauma s/p biopsy.    DEBRIDEMENT OF ABDOMINAL WALL ABSCESS N/A 02/08/2013   Procedure: DEBRIDEMENT OF ABDOMINAL WALL ABSCESS;  Surgeon: Jamesetta So, MD;  Location: [facility];  Service: General;  Laterality: N/A;   ESOPHAGOGASTRODUODENOSCOPY (EGD) WITH PROPOFOL N/A 05/20/2013   Dr.Rourk- s/p prior gastric surgery with normal esophagus, residual gastric mucosa and patent efferent limb   ESOPHAGOGASTRODUODENOSCOPY (EGD) WITH PROPOFOL N/A 02/03/2014   Dr. Gala Romney:  s/p hemigastrectomy with retained gastric contents. Residual gastric mucosa and efferent limb  appeared normal otherwise. Query gastroparesis.    ESOPHAGOGASTRODUODENOSCOPY (EGD) WITH PROPOFOL N/A 04/26/2014   JIZ:[id number]'L ring/small HH/mild non-erosive gasrtitis/normal anastomosis   ESOPHAGOGASTRODUODENOSCOPY (EGD) WITH PROPOFOL N/A 12/23/2014   Dr.Rourk- s/p prior hemigastrctomy, active oozing from anastomotic suture site, hemostasis achieved   ESOPHAGOGASTRODUODENOSCOPY (EGD) WITH PROPOFOL N/A 05/23/2016   Dr. Oneida Alar while inpatient: red blood at anastomosis, s/p epi injection  and clips, likely secondary to Dieulafoy's lesion at anastomosis    ESOPHAGOGASTRODUODENOSCOPY (EGD) WITH PROPOFOL N/A 05/11/2017   Procedure: ESOPHAGOGASTRODUODENOSCOPY (EGD) WITH PROPOFOL;  Surgeon: Daneil Dolin, MD;  [facility];  Service: Endoscopy;  Laterality: N/A;   EXCISION OF MESH  08/2019   HEMORRHOID SURGERY N/A 06/18/2017   Procedure: THREE COLUMN EXTENSIVE HEMORRHOIDECTOMY;  Surgeon: Virl Cagey, MD;  Location: [facility];  Service: General;  Laterality: N/A;   INCISION AND DRAINAGE ABSCESS N/A 01/06/2017   Procedure: INCISION AND DRAINAGE ABDOMINAL WALL ABSCESS;  Surgeon: Aviva Signs, MD;  Location[facility];  Service: General;  Laterality: N/A;   LEFT HEART CATH AND CORONARY ANGIOGRAPHY N/A 05/09/2020   Procedure: LEFT HEART CATH AND CORONARY ANGIOGRAPHY;  Surgeon: Nigel Mormon, MD;  Location: [place] [facility];  Service: Cardiovascular;  Laterality: N/A;   POLYPECTOMY  12/24/2018   Procedure: POLYPECTOMY;  Surgeon: Daneil Dolin, MD;  [facility];  Service: Endoscopy;;  colon   tendon repar Right    wrist   WOUND EXPLORATION Right 06/24/2013   Procedure: exploration of traumatic wound right wrist;  Surgeon: Tennis Must, MD;  Location: [facility];  Service: Orthopedics;  Laterality: Right;   Social History   Social History Narrative   ** Merged History Encounter **       Immunization History  Administered Date(s) Administered   Influenza,inj,Quad PF,6+ Mos  02/08/2013, 04/02/2014, 12/23/2014, 12/03/2016   Influenza-Unspecified 02/08/2013, 04/02/2014, 12/23/2014, 12/03/2016   Pneumococcal Polysaccharide-23 06/25/2013, 12/03/2016, 11/12/2018   Tdap 03/16/2013, 06/24/2013     Objective: Vital Signs: BP (!) 165/112 (BP Location: Left Arm, Patient Position: Sitting, Cuff Size: Normal)   Pulse 71   Ht 5\' 3"  (1.6 m)   Wt 156 lb 3.2 oz (70.9 kg)   LMP  (LMP Unknown)   BMI 27.67 kg/m    Physical Exam Constitutional:      Appearance: Normal appearance.  Skin:    General: Skin is warm and dry.     Findings: No rash.  Neurological:     Mental Status: She is alert.  Psychiatric:        Mood and Affect: Mood normal.     Musculoskeletal Exam:  Left wrist and 2nd-3rd MCPs mild swelling and tenderness, right side norma   Investigation: No additional findings.  Imaging: XR Elbow 2 Views Left  Result Date: 12/08/2020 X-ray left elbow 2 views Normal-appearing elbow x-ray no significant joint space loss osteophytes or erosions.  XR Hand 2 View Left  Result Date: 12/08/2020 X-ray left hand 2 views Radiocarpal joint space appears normal.  Mild degenerative changes around first [facility] joint.  MCP and PIP joints appear normal.  Mild degenerative change of DIPs visible at second digit most.  Bone mineralization suggestive for generalized osteopenia. Impression Mild osteoarthritis, no erosive disease generalized osteopenia could indicate chronic inflammatory process or possibly low bone density systemically  XR KNEE 3 VIEW LEFT  Result Date: 12/08/2020 X-ray left knee 3 views Normal appearance of the joint all 3 compartments.  Small posterior calcification visible in lateral view.  Linear opacities in distal femur and proximal tibia consistent with growth lines.  No significant effusion visible. Impression Normal-appearing knee x-ray  XR KNEE 3 VIEW RIGHT  Result Date: 12/08/2020 X-ray right knee 3 views Normal knee joint appearance and all 3  compartments.  Linear opacities in the distal femur partial thickness in AP view probable growth lines.  No significant visible effusion. Impression Normal-appearing  knee x-ray   Recent Labs: Lab Results  Component Value Date   WBC 14.0 (H) 10/14/2020   HGB 13.1 10/14/2020   PLT 441 (H) 10/14/2020   NA 137 10/14/2020   K 3.5 10/14/2020   CL 105 10/14/2020   CO2 19 (L) 10/14/2020   GLUCOSE 87 10/14/2020   BUN 10 10/14/2020   CREATININE 0.61 10/14/2020   BILITOT 0.6 10/14/2020   ALKPHOS 82 10/14/2020   AST 24 10/14/2020   ALT 22 10/14/2020   PROT 7.7 10/14/2020   ALBUMIN 4.0 10/14/2020   CALCIUM 9.0 10/14/2020   GFRAA 119 05/05/2020    Speciality Comments: No specialty comments available.  Procedures:  No procedures performed Allergies: Metoclopramide and Reglan [metoclopramide]   Assessment / Plan:     Visit Diagnoses: Lupus ([place]) - Plan: hydroxychloroquine (PLAQUENIL) 200 MG tablet, Ambulatory referral to Ophthalmology  Labs and clinical course consistent with systemic lupus does not appear to have highly active disease no evidence of other organ system involvement at this time mainly arthritis complaint.  Possible rheumatoid arthritis overlap with low positive rheumatoid factor.  Start hydroxychloroquine 200 mg p.o. daily.  She thinks she has taken this medicine in the past but does not recall when reviewed side effect retinal toxicity.screening. She has been seeing an optometrist regularly but will refer to ophthalmology for this.  Orders: Orders Placed This Encounter  Procedures   Ambulatory referral to Ophthalmology    Meds ordered this encounter  Medications   hydroxychloroquine (PLAQUENIL) 200 MG tablet    Sig: Take 1 tablet (200 mg total) by mouth daily.    Dispense:  30 tablet    Refill:  2      Follow-Up Instructions: Return in about 2 months (around 02/27/2021) for SLE HCQ start f/u 8wks.   Collier Salina, MD  Note - This record has been created  using [employer].  Chart creation errors have been sought, but may not always  have been located. Such creation errors do not reflect on  the standard of medical care.

## 2020-12-28 NOTE — Patient Instructions (Addendum)
Your xrays show mild osteoarthritis changes, which is wear and tear type damage in the joints. There was no erosion or evidence of permanent joint damage from the lupus activity.  You lab tests are positive for lupus specific antibodies and weakly positive for rheumatoid arthritis antibodies.  I recommend starting hydroxychloroquine 200 mg daily for these symptoms particularly joint pain and may also help for skin rashes or hair loss.

## 2020-12-28 NOTE — Telephone Encounter (Signed)
Patient asking for a note stating she was seen here, and she is good to go back to work. Please call to discuss. Patient will pick up note once ready.

## 2020-12-30 ENCOUNTER — Other Ambulatory Visit: Payer: Self-pay | Admitting: Internal Medicine

## 2020-12-30 DIAGNOSIS — M329 Systemic lupus erythematosus, unspecified: Secondary | ICD-10-CM

## 2020-12-31 DIAGNOSIS — F17200 Nicotine dependence, unspecified, uncomplicated: Secondary | ICD-10-CM | POA: Diagnosis not present

## 2020-12-31 DIAGNOSIS — I10 Essential (primary) hypertension: Secondary | ICD-10-CM | POA: Diagnosis not present

## 2021-01-05 ENCOUNTER — Telehealth: Payer: Self-pay | Admitting: Internal Medicine

## 2021-01-05 DIAGNOSIS — M329 Systemic lupus erythematosus, unspecified: Secondary | ICD-10-CM

## 2021-01-05 MED ORDER — PREDNISONE 10 MG PO TABS: ORAL_TABLET | ORAL | 0 refills | Status: AC

## 2021-01-05 NOTE — Telephone Encounter (Signed)
Patient calling in reference to pain she is having all over. Patient would like to know what doctor recommends for pain. Please call to advise. Patient uses [facility] in [place], [place].

## 2021-01-05 NOTE — Telephone Encounter (Signed)
She needs to schedule clinic follow up in about 2 months for starting hydroxychloroquine.  I spoke with her already about increased pain and left sided swelling prescribed prednisone taper for next 2 weeks.

## 2021-01-20 ENCOUNTER — Other Ambulatory Visit: Payer: Self-pay | Admitting: Cardiology

## 2021-01-20 DIAGNOSIS — I25118 Atherosclerotic heart disease of native coronary artery with other forms of angina pectoris: Secondary | ICD-10-CM

## 2021-03-07 NOTE — Progress Notes (Signed)
Office Visit Note  Patient: Angel Price             Date of Birth: 06-30-1965           MRN: [id number]             PCP: Rosita Fire, MD Referring: Rosita Fire, MD Visit Date: 03/08/2021   Subjective:  Follow-up (Improving)   History of Present Illness: Angel Price is a 55 y.o. female here for follow up for lupus or UCTD with inflammatory arthritis after starting hydroxychloroquine 200 mg daily. She feels symptoms are overall improved less difficulty moving around. She still notices some anterior knee pain worse when going up or down stairs. She had one episode of ulcer in her nose on the right side with some drainage currently healed.  Previous HPI 12/28/20 Angel Price is a 55 y.o. female here for follow up for lupus with several ongoing symptoms but most recently having joint pain with some swelling on left elbow wrist and MCPs were seen on exam. Laboratory work-up was consistent with lupus with positive rheumatoid factor slightly low serum complement.  No changes or events since her last visit.    Previous HPI 12/08/20 Angel Price is a 55 y.o. female here for lupus. 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 Previous serology showed positive ANA and RF. Past imaging showing degenerative changes in lumbar spine.  She has not followed up with any rheumatologist for many years. She recalls use of NSAIDs and steroids and thinks plaquenil sounds familiar but not sure of an other long term lupus treatment. 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  She is currently following with cardiology for angina loss of consciousness no obstructive CAD on extensive work-up including coronary angiography attributed to suspected microvascular dysfunction. She is required  abdominal surgeries for gastrointestinal bleeding with dieulafoy lesion of duodenum.  Abdominal mesh surgery for repair of incisional hernia was complicated with infection attributed in part to continue nicotine abuse.   Review of Systems  Constitutional:  Positive for fatigue.  HENT:  Negative for mouth dryness.   Eyes:  Negative for dryness.  Respiratory:  Positive for shortness of breath.   Cardiovascular:  Negative for swelling in legs/feet.  Gastrointestinal:  Positive for constipation.  Endocrine: Positive for heat intolerance, excessive thirst and increased urination.  Genitourinary:  Negative for difficulty urinating.  Musculoskeletal:  Positive for joint pain, gait problem, joint pain, joint swelling, muscle weakness, morning stiffness and muscle tenderness.  Skin:  Negative for rash.  Allergic/Immunologic: Positive for susceptible to infections.  Neurological:  Positive for weakness.  Hematological:  Positive for bruising/bleeding tendency.  Psychiatric/Behavioral:  Positive for sleep disturbance.    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[place]) 06/15/2019   Acute respiratory failure with hypoxia ([place]) 05/20/2019   Essential hypertension 05/20/2019   COVID-19 05/20/2019   Syncope and collapse 05/20/2019   Right sided weakness 05/16/2019  Metabolic acidosis 70/62/3762   Intractable vomiting with nausea 02/23/2019   Abnormal CT scan, colon 12/09/2018   Abnormal computed tomography of cecum and terminal ileum 12/09/2018   Cocaine abuse ([place])    Colitis 11/11/2018   Hemorrhoidal skin  tag    SBO (small bowel obstruction) (HCC)    Non-intractable vomiting with nausea    Folate deficiency 05/11/2017   Abdominal wall fistula    Cellulitis of abdominal wall 01/02/2017   Acute left hemiparesis ([place]) 12/05/2016   Malingering 12/05/2016   Weakness of left lower extremity 12/02/2016   CVA (cerebral vascular accident) ([place]) 11/23/2016   Bright red rectal bleeding 08/22/2016   Hypotension due to blood loss    Acute GI bleeding 05/24/2016   Dieulafoy lesion of duodenum    History of Billroth II operation    Gastrointestinal hemorrhage 05/22/2016   Absolute anemia    Acute pancreatitis 10/01/2015   Abnormal CT scan of lung 10/01/2015   Alcohol abuse with intoxication ([place])    Left-sided weakness    Psychosomatic factor in physical condition    Upper GI bleed    Diverticulosis of colon with hemorrhage    Chronic wound infection of abdomen 12/22/2014   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   HTN (hypertension) 12/22/2014   Ataxia 11/01/2014   Hemorrhoids 04/26/2014   Lung nodule 04/26/2014   Diverticulosis    Gastritis    Hiatal hernia    Schatzki's ring    Acute blood loss anemia 04/25/2014   Sinus tachycardia 04/25/2014   Hypokalemia 04/25/2014   Hematemesis with nausea 04/25/2014   Lung nodule < 6cm on CT 04/25/2014   Intractable nausea and vomiting 04/24/2014   Gastroparesis    Abdominal pain 04/01/2014   Chronic abdominal pain 01/21/2014   Gastroenteritis 12/10/2013   Chronic abdominal wound infection 08/16/2013   MDD (major depressive disorder), recurrent episode, severe ([place]) 06/27/2013   Wrist laceration 06/24/2013   Diarrhea 05/07/2013   Rectal bleeding 05/07/2013   Abnormal LFTs 05/07/2013   Adrenal mass, left ([place]) 05/07/2013   Abdominal wall abscess at site of surgical wound 04/19/2013   Abdominal wall abscess 04/18/2013   Frequent headaches 03/30/2013   Sleep difficulties 03/30/2013   Essential hypertension, benign 03/30/2013   History of cocaine  abuse ([place]) 03/16/2013   History of schizoaffective disorder 03/16/2013   Bipolar disorder ([place]) 03/16/2013   Personality disorder ([place]) 03/16/2013   Tobacco abuse 03/16/2013   Alcohol abuse 03/16/2013   Palpitations 03/16/2013   Poor vision 03/16/2013   History of gastric bypass 03/16/2013   Status post hysterectomy with oophorectomy 03/16/2013   Hypothyroid 03/16/2013   Lupus ([place]) 03/16/2013    Past Medical History:  Diagnosis Date   Abscess    soft tissue   Adrenal mass ([place])    Alcohol abuse    Anxiety    Blood transfusion without reported diagnosis    Chronic abdominal pain    Chronic wound infection of abdomen    Colon polyp    colonoscopy 04/2014   Depression    Diverticulosis    colonoscopy 04/2014 moderat pan colonic   Drug-seeking behavior    Gastritis    EGD 05/2014   Gastroparesis Nov 2015   GERD (gastroesophageal reflux disease)    Hemorrhoid    internal large   Hiatal hernia    History of Billroth II operation    Hypertension    Lung nodule    CT 02/2014 needs repeat 1 month   Lung nodule < 6cm  on CT 04/25/2014   Lupus ([place])    Malingering    Nausea and vomiting    chronic, recurrent   Pancreatitis    Schatzki's ring    patent per EGD 04/2014   Sickle cell trait ([place])    Suicide attempt ([place])    Thyroid disease 2000   overactive, radiation    Family History  Problem Relation Age of Onset   Drug abuse Mother    Lung cancer Father    Cancer Father        mets   Drug abuse Sister    Drug abuse Brother    Breast cancer Maternal Aunt    Bipolar disorder Maternal Aunt    Drug abuse Maternal Aunt    Colon cancer Maternal Grandmother        late 36s, early 15s   Bipolar disorder Paternal Grandfather    Brain cancer Son    Schizophrenia Son    Cancer Son        brain   Bipolar disorder Cousin    Liver disease Neg Hx    Past Surgical History:  Procedure Laterality Date   ABDOMINAL HYSTERECTOMY  2013   [place]   ABDOMINAL HYSTERECTOMY      ABDOMINAL SURGERY     ADRENALECTOMY Right    AGILE CAPSULE N/A 01/05/2015   Procedure: AGILE CAPSULE;  Surgeon: Daneil Dolin, MD;  [facility];  Service: Endoscopy;  Laterality: N/A;  0700   Billroth II procedure      [place], first 2000, 2005/2006.   bilroth 2     BIOPSY  05/20/2013   Procedure: BIOPSIES OF ASCENDING AND SIGMOID COLON;  Surgeon: Daneil Dolin, MD;  Location: [facility];  Service: Endoscopy;;   BIOPSY  04/26/2014   Procedure: BIOPSIES;  Surgeon: Danie Binder, MD;  Location: [facility];  Service: Endoscopy;;   BIOPSY  12/24/2018   Procedure: BIOPSY;  Surgeon: Daneil Dolin, MD;  [facility];  Service: Endoscopy;;  colon   CHOLECYSTECTOMY     COLONOSCOPY     in [place]   COLONOSCOPY WITH PROPOFOL N/A 05/20/2013   Dr.Rourk- inadequate prep, normal appearing rectum, grossly normal colon aside from pancolonic diverticula, normal terminal ileum bx= unremarkable colonic mucosa. Due for early interval 2016.    COLONOSCOPY WITH PROPOFOL N/A 04/26/2014   SNK:NLZJQB ileum/one colon polyp removed/moderate pan-colonic diverticulosis/large internal hemorrhoids   COLONOSCOPY WITH PROPOFOL N/A 12/23/2014   Dr.Rourk- minimal internal hemorrhoids, pancolonic diverticulosis   COLONOSCOPY WITH PROPOFOL N/A 08/23/2016   Procedure: COLONOSCOPY WITH PROPOFOL;  Surgeon: Danie Binder, MD;  [facility];  Service: Endoscopy;  Laterality: N/A;   COLONOSCOPY WITH PROPOFOL N/A 12/24/2018   Pancolonic diverticulosis, normal TI, one 5 mm polyp in rectum (tubular adenoma). Somewhat friable hemorrhagic mucosa in area of IC valve/cecum, possibly related to scope trauma s/p biopsy.    DEBRIDEMENT OF ABDOMINAL WALL ABSCESS N/A 02/08/2013   Procedure: DEBRIDEMENT OF ABDOMINAL WALL ABSCESS;  Surgeon: Jamesetta So, MD;  Location: [facility];  Service: General;  Laterality: N/A;   ESOPHAGOGASTRODUODENOSCOPY (EGD) WITH PROPOFOL N/A 05/20/2013   Dr.Rourk- s/p prior gastric surgery with  normal esophagus, residual gastric mucosa and patent efferent limb   ESOPHAGOGASTRODUODENOSCOPY (EGD) WITH PROPOFOL N/A 02/03/2014   Dr. Gala Romney:  s/p hemigastrectomy with retained gastric contents. Residual gastric mucosa and efferent limb appeared normal otherwise. Query gastroparesis.    ESOPHAGOGASTRODUODENOSCOPY (EGD) WITH PROPOFOL N/A 04/26/2014   HAL:[id number]'X ring/small HH/mild  non-erosive gasrtitis/normal anastomosis   ESOPHAGOGASTRODUODENOSCOPY (EGD) WITH PROPOFOL N/A 12/23/2014   Dr.Rourk- s/p prior hemigastrctomy, active oozing from anastomotic suture site, hemostasis achieved   ESOPHAGOGASTRODUODENOSCOPY (EGD) WITH PROPOFOL N/A 05/23/2016   Dr. 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    ESOPHAGOGASTRODUODENOSCOPY (EGD) WITH PROPOFOL N/A 05/11/2017   Procedure: ESOPHAGOGASTRODUODENOSCOPY (EGD) WITH PROPOFOL;  Surgeon: Daneil Dolin, MD;  [facility];  Service: Endoscopy;  Laterality: N/A;   EXCISION OF MESH  08/2019   HEMORRHOID SURGERY N/A 06/18/2017   Procedure: THREE COLUMN EXTENSIVE HEMORRHOIDECTOMY;  Surgeon: Virl Cagey, MD;  Location: [facility];  Service: General;  Laterality: N/A;   INCISION AND DRAINAGE ABSCESS N/A 01/06/2017   Procedure: INCISION AND DRAINAGE ABDOMINAL WALL ABSCESS;  Surgeon: Aviva Signs, MD;  Location: [facility];  Service: General;  Laterality: N/A;   LEFT HEART CATH AND CORONARY ANGIOGRAPHY N/A 05/09/2020   Procedure: LEFT HEART CATH AND CORONARY ANGIOGRAPHY;  Surgeon: Nigel Mormon, MD;  Location: [facility];  Service: Cardiovascular;  Laterality: N/A;   POLYPECTOMY  12/24/2018   Procedure: POLYPECTOMY;  Surgeon: Daneil Dolin, MD;  [facility];  Service: Endoscopy;;  colon   tendon repar Right    wrist   WOUND EXPLORATION Right 06/24/2013   Procedure: exploration of traumatic wound right wrist;  Surgeon: Tennis Must, MD;   Location: [facility];  Service: Orthopedics;  Laterality: Right;   Social History   Social History Narrative   ** Merged History Encounter **       Immunization History  Administered Date(s) Administered   Influenza,inj,Quad PF,6+ Mos 02/08/2013, 04/02/2014, 12/23/2014, 12/03/2016   Influenza-Unspecified 02/08/2013, 04/02/2014, 12/23/2014, 12/03/2016   Pneumococcal Polysaccharide-23 06/25/2013, 12/03/2016, 11/12/2018   Tdap 03/16/2013, 06/24/2013     Objective: Vital Signs: BP 106/77 (BP Location: Left Arm, Patient Position: Sitting, Cuff Size: Normal)    Pulse (!) 102    Resp 16    Ht 5' 3" (1.6 m)    Wt 154 lb (69.9 kg)    LMP  (LMP Unknown)    BMI 27.28 kg/m    Physical Exam HENT:     Right Ear: External ear normal.     Left Ear: External ear normal.     Nose: Nose normal.     Mouth/Throat:     Mouth: Mucous membranes are moist.     Pharynx: Oropharynx is clear.  Eyes:     Conjunctiva/sclera: Conjunctivae normal.  Cardiovascular:     Rate and Rhythm: Regular rhythm. Tachycardia present.  Pulmonary:     Effort: Pulmonary effort is normal.     Breath sounds: Normal breath sounds.  Musculoskeletal:     Right lower leg: No edema.     Left lower leg: No edema.  Skin:    General: Skin is warm and dry.     Findings: No rash.  Neurological:     General: No focal deficit present.     Mental Status: She is alert.  Psychiatric:        Mood and Affect: Mood normal.     Musculoskeletal Exam:  Shoulders full ROM no tenderness or swelling Elbows full ROM no tenderness or swelling Wrists full ROM no tenderness or swelling Fingers full ROM no tenderness or swelling Knees full ROM no tenderness or swelling, bilateral patellofemoral crepitus present Ankles full ROM no tenderness or swelling   CDAI Exam: CDAI Score:  1  Patient Global: 10 mm; Provider Global: 0 mm Swollen: 0 ; Tender: 0  Joint Exam 03/08/2021   All documented joints were normal     Investigation: No  additional findings.  Imaging: No results found.  Recent Labs: Lab Results  Component Value Date   WBC 11.6 (H) 03/08/2021   HGB 13.7 03/08/2021   PLT 348 03/08/2021   NA 137 10/14/2020   K 3.5 10/14/2020   CL 105 10/14/2020   CO2 19 (L) 10/14/2020   GLUCOSE 87 10/14/2020   BUN 10 10/14/2020   CREATININE 0.61 10/14/2020   BILITOT 0.6 10/14/2020   ALKPHOS 82 10/14/2020   AST 24 10/14/2020   ALT 22 10/14/2020   PROT 7.7 10/14/2020   ALBUMIN 4.0 10/14/2020   CALCIUM 9.0 10/14/2020   GFRAA 119 05/05/2020    Speciality Comments: No specialty comments available.  Procedures:  No procedures performed Allergies: Metoclopramide and Reglan [metoclopramide]   Assessment / Plan:     Visit Diagnoses: Lupus ([place]) - Plan: CBC with Differential/Platelet, Sedimentation rate, C3 and C4, hydroxychloroquine (PLAQUENIL) 200 MG tablet  Symptoms appear at least partially improved since starting HCQ particularly joint pain and stiffness which was the biggest problem. Rechecking ESR, CBC, and complement for disease activity assessment. Plan to increase HCQ to 300 mg daily dose. She has ophthalmology exam coming up planned within next 2 months.  Anemia, unspecified type  Repeating CBC today for monitoring and with HCQ new start.  Orders: Orders Placed This Encounter  Procedures   CBC with Differential/Platelet   Sedimentation rate   C3 and C4   Meds ordered this encounter  Medications   hydroxychloroquine (PLAQUENIL) 200 MG tablet    Sig: Take 1.5 tablets (300 mg total) by mouth daily.    Dispense:  135 tablet    Refill:  1     Follow-Up Instructions: Return in about 6 months (around 09/06/2021) for SLE/RA HCQ f/u 78mo.   CCollier Salina MD  Note - This record has been created using [employer]  Chart creation errors have been sought, but may not always  have been located. Such creation errors do not reflect on  the standard of medical care.

## 2021-03-08 ENCOUNTER — Ambulatory Visit (INDEPENDENT_AMBULATORY_CARE_PROVIDER_SITE_OTHER): Payer: Medicare Other | Admitting: Internal Medicine

## 2021-03-08 ENCOUNTER — Encounter: Payer: Self-pay | Admitting: Internal Medicine

## 2021-03-08 ENCOUNTER — Other Ambulatory Visit: Payer: Self-pay

## 2021-03-08 VITALS — BP 106/77 | HR 102 | Resp 16 | Ht 63.0 in | Wt 154.0 lb

## 2021-03-08 DIAGNOSIS — M329 Systemic lupus erythematosus, unspecified: Secondary | ICD-10-CM

## 2021-03-08 DIAGNOSIS — D649 Anemia, unspecified: Secondary | ICD-10-CM | POA: Diagnosis not present

## 2021-03-08 MED ORDER — HYDROXYCHLOROQUINE SULFATE 200 MG PO TABS: 300.0000 mg | ORAL_TABLET | Freq: Every day | ORAL | 1 refills | Status: DC

## 2021-03-09 ENCOUNTER — Telehealth: Payer: Self-pay | Admitting: Internal Medicine

## 2021-03-09 LAB — CBC WITH DIFFERENTIAL/PLATELET
Absolute Monocytes: 847 cells/uL (ref 200–950)
Basophils Absolute: 46 cells/uL (ref 0–200)
Basophils Relative: 0.4 %
Eosinophils Absolute: 70 cells/uL (ref 15–500)
Eosinophils Relative: 0.6 %
HCT: 41.5 % (ref 35.0–45.0)
Hemoglobin: 13.7 g/dL (ref 11.7–15.5)
Lymphs Abs: 2993 cells/uL (ref 850–3900)
MCH: 26.2 pg — ABNORMAL LOW (ref 27.0–33.0)
MCHC: 33 g/dL (ref 32.0–36.0)
MCV: 79.3 fL — ABNORMAL LOW (ref 80.0–100.0)
MPV: 9.6 fL (ref 7.5–12.5)
Monocytes Relative: 7.3 %
Neutro Abs: 7644 cells/uL (ref 1500–7800)
Neutrophils Relative %: 65.9 %
Platelets: 348 10*3/uL (ref 140–400)
RBC: 5.23 10*6/uL — ABNORMAL HIGH (ref 3.80–5.10)
RDW: 19.9 % — ABNORMAL HIGH (ref 11.0–15.0)
Total Lymphocyte: 25.8 %
WBC: 11.6 10*3/uL — ABNORMAL HIGH (ref 3.8–10.8)

## 2021-03-09 LAB — C3 AND C4
C3 Complement: 127 mg/dL (ref 83–193)
C4 Complement: 18 mg/dL (ref 15–57)

## 2021-03-09 LAB — SEDIMENTATION RATE: Sed Rate: 2 mm/h (ref 0–30)

## 2021-03-09 NOTE — Telephone Encounter (Signed)
Advised patient that per Dr. Benjamine Mola, Lab results look good. The complement test is now normal this is an improvement from being low which is seen with active lupus. Her sedimentation rate test is normal. The white blood cell count is very slightly high, less so than before and I am not worried about this. Patient verbalized understanding.

## 2021-03-09 NOTE — Telephone Encounter (Signed)
Lab results look good. The complement test is now normal this is an improvement from being low which is seen with active lupus. Her sedimentation rate test is normal. The white blood cell count is very slightly high, less so than before and I am not worried about this.

## 2021-03-09 NOTE — Telephone Encounter (Signed)
Patient called the office stating she has saw where her bloodwork results had came in and would like a return call. Patient states she would like someone to explain what her results means.

## 2021-03-14 IMAGING — CT CT ABD-PELV W/ CM
2 of 5 series · 15 of 46 positions shown, 17 images · IV contrast (omnipaque)
Comparison: 07/13/2019

CLINICAL DATA: Abdominal pain, hematemesis, nausea and vomiting

EXAM:
CT ABDOMEN AND PELVIS WITH CONTRAST
TECHNIQUE: Multidetector CT imaging of the abdomen and pelvis was performed
using the standard protocol following bolus administration of
intravenous contrast.
CONTRAST:  100mL OMNIPAQUE IOHEXOL 300 MG/ML  SOLN

[Series 3: abdomen 5.0 · axial · 0.82mm/px · z∈[-490,-85]mm · 12 of 97 slices shown, 14 images]
[im 8/97  soft-tissue]
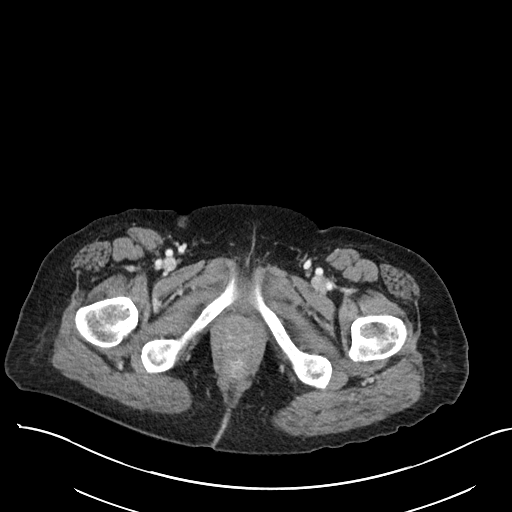
[im 8/97  bone]
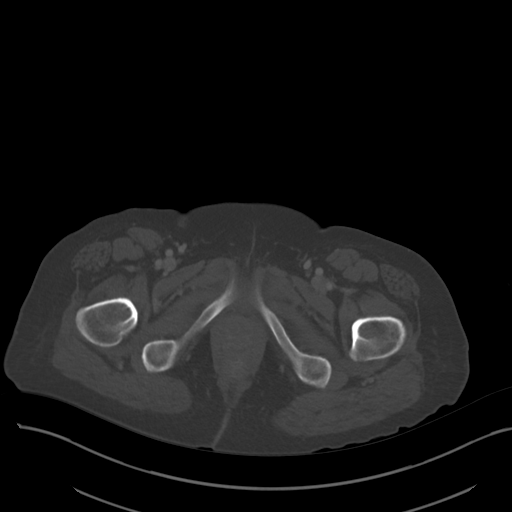
[im 15/97  soft-tissue]
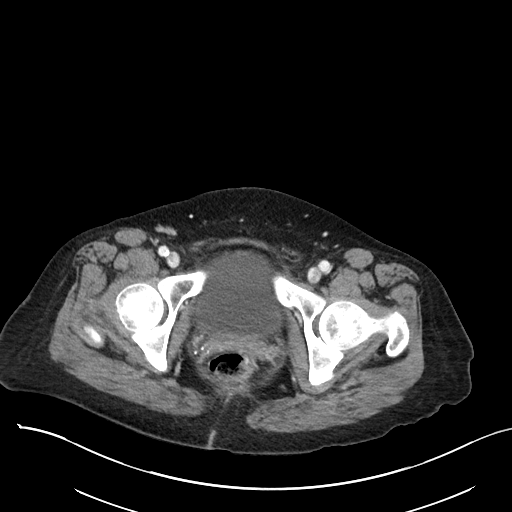
[im 23/97  soft-tissue]
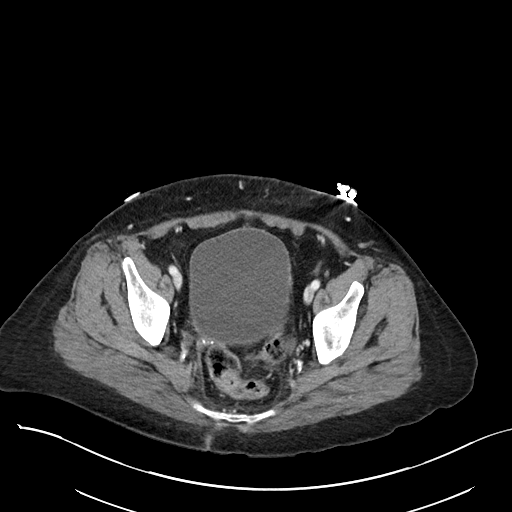
[im 30/97  soft-tissue]
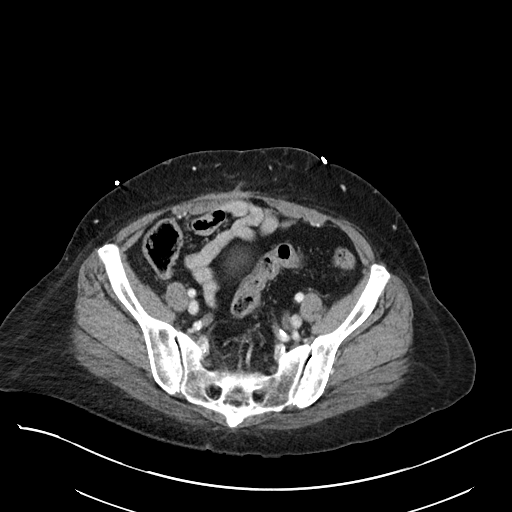
[im 37/97  soft-tissue]
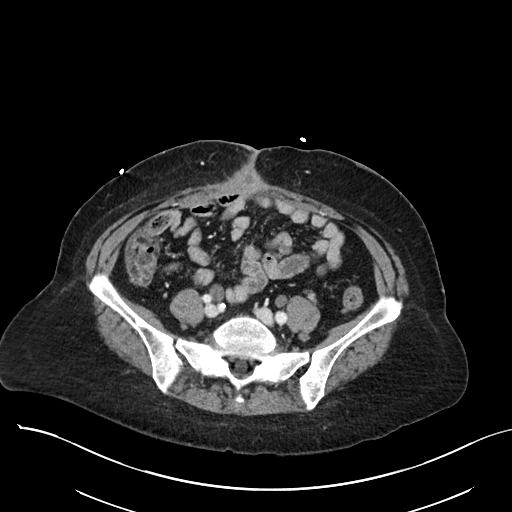
[im 45/97  soft-tissue]
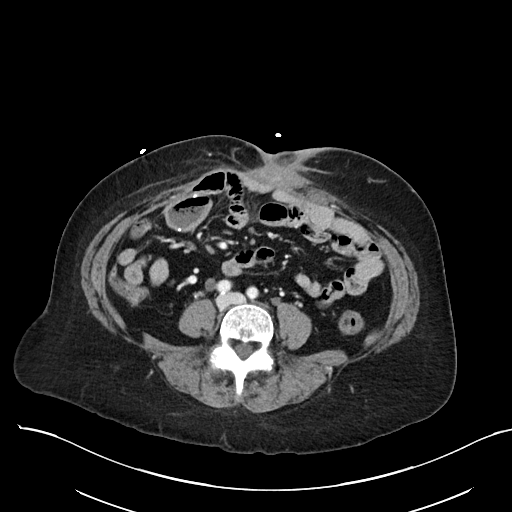
[im 52/97  soft-tissue]
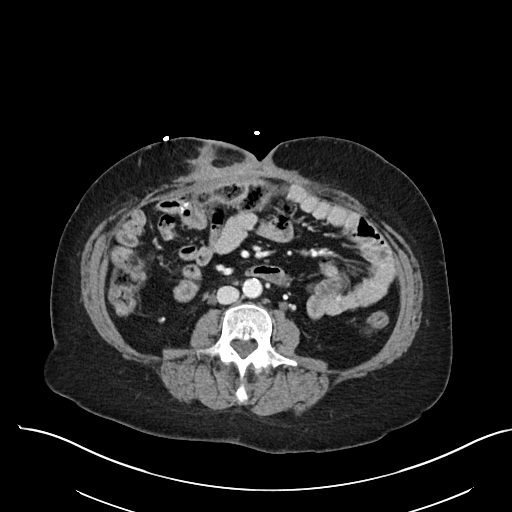
[im 60/97  soft-tissue]
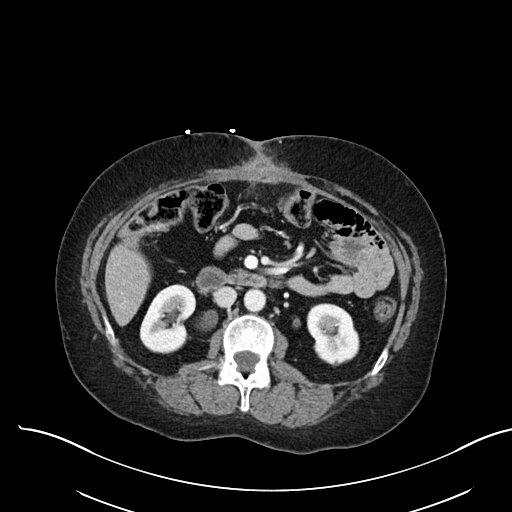
[im 67/97  soft-tissue]
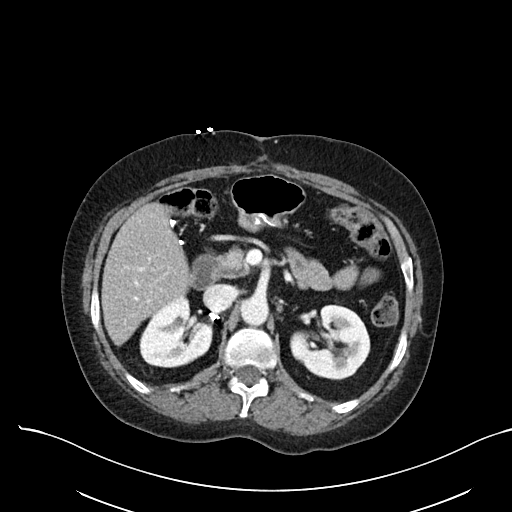
[im 67/97  bone]
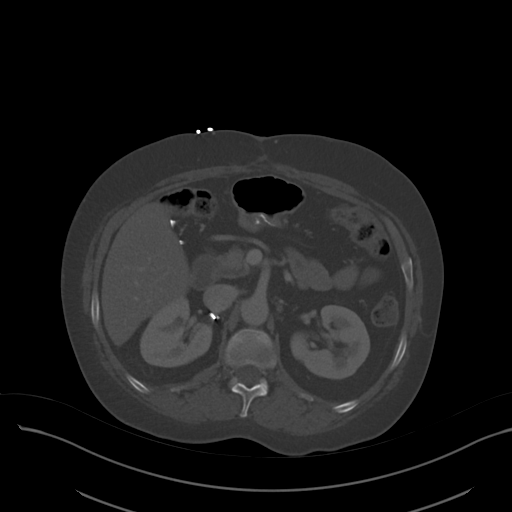
[im 74/97  soft-tissue]
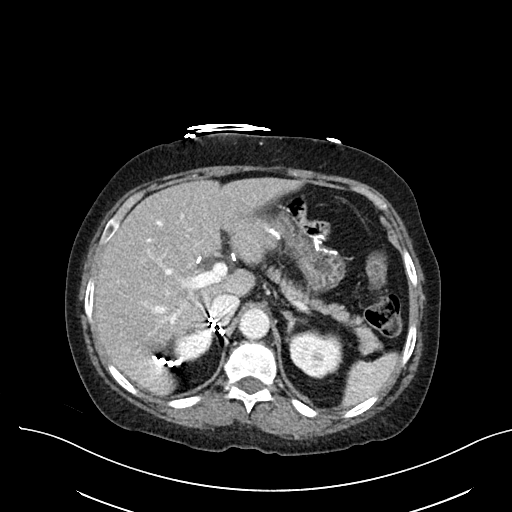
[im 82/97  soft-tissue]
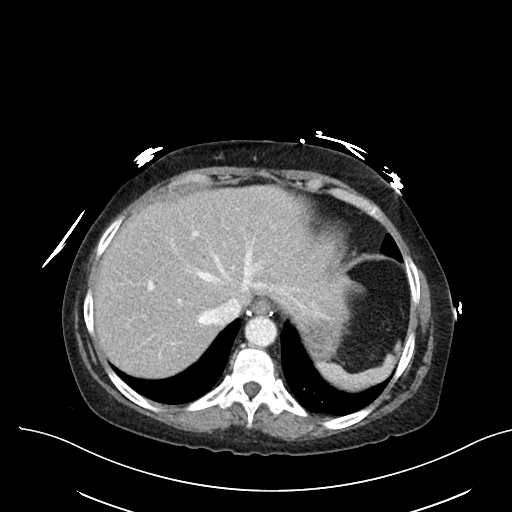
[im 89/97  soft-tissue]
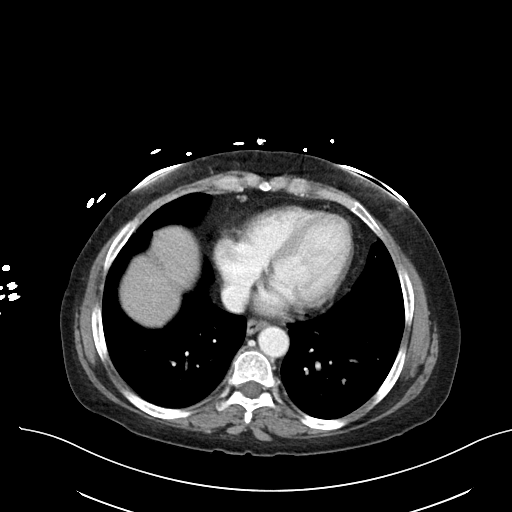

[Series 6: abdomen 3.0 mpr cor · coronal · 0.63mm/px · 3 of 90 slices shown]
[im 30/90  soft-tissue]
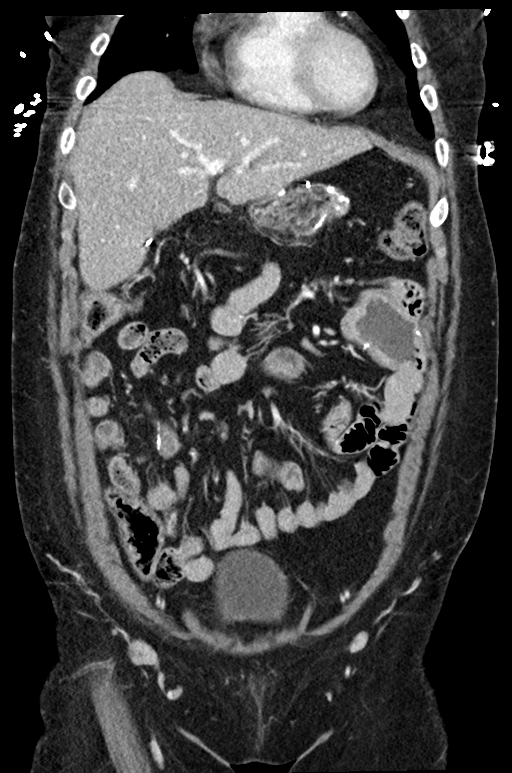
[im 40/90  soft-tissue]
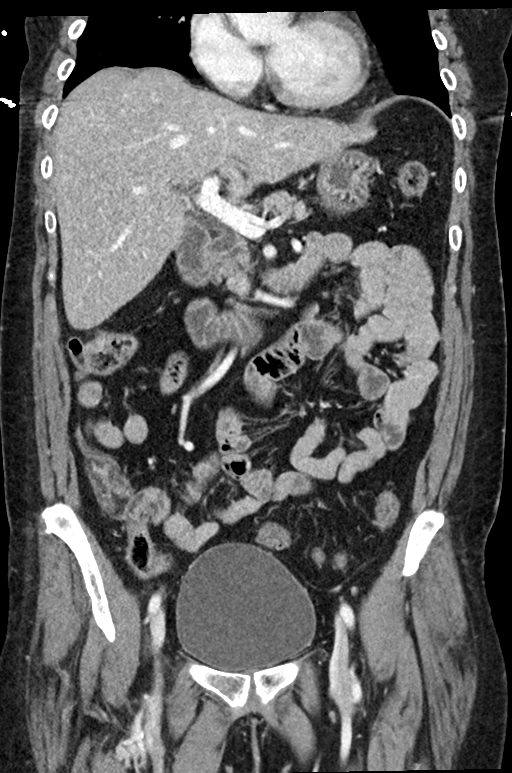
[im 50/90  soft-tissue]
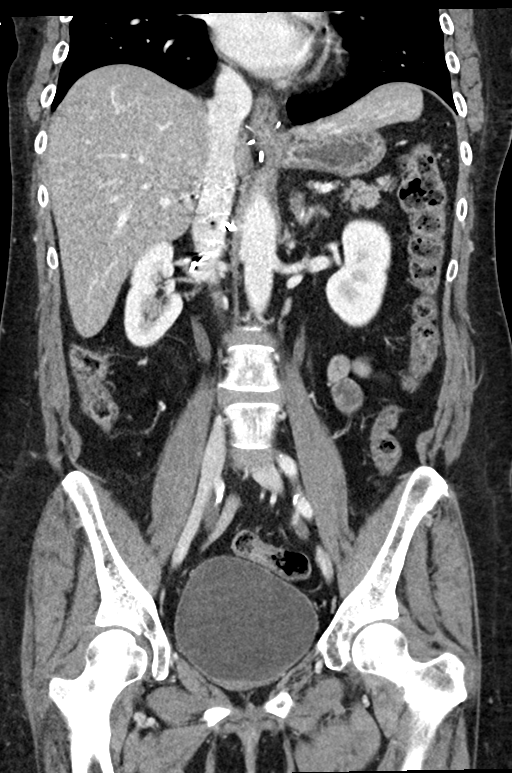

[15 of 46 positions shown; findings below may reference images not displayed]

FINDINGS: Lower chest: No acute pleural or parenchymal lung disease.

Hepatobiliary: No focal liver abnormality is seen. Status post
cholecystectomy. No biliary dilatation.

Pancreas: Unremarkable. No pancreatic ductal dilatation or
surrounding inflammatory changes.

Spleen: Normal in size without focal abnormality.

Adrenals/Urinary Tract: Stable left adrenal adenoma measuring
cm. The right adrenal is surgically absent. The kidneys enhance
normally and symmetrically. No evidence of urinary tract calculi.
Mild distension of the bilateral ureters is likely due to moderate
bladder distension. No filling defects within the urinary bladder.

Stomach/Bowel: No bowel obstruction or ileus. Postsurgical changes
are seen from distal gastrectomy with gastrojejunostomy. No bowel
wall thickening or inflammatory change.

Vascular/Lymphatic: Aortic atherosclerosis. No enlarged abdominal or
pelvic lymph nodes.

Reproductive: Status post hysterectomy. No adnexal masses.

Other: No free fluid or free gas. Postsurgical changes from prior
midline laparotomy, with stable abdominal wall scar and small
seroma.

Musculoskeletal: No acute or destructive bony lesions. Reconstructed
images demonstrate no additional findings.
IMPRESSION: 1. No acute intra-abdominal or intrapelvic process.
2. Stable postsurgical changes as above.
3. Aortic Atherosclerosis (ED66C-A14.4).

## 2021-03-15 ENCOUNTER — Other Ambulatory Visit: Payer: Self-pay

## 2021-03-15 ENCOUNTER — Encounter (HOSPITAL_COMMUNITY): Payer: Self-pay

## 2021-03-15 ENCOUNTER — Emergency Department (HOSPITAL_COMMUNITY)
Admission: EM | Admit: 2021-03-15 | Discharge: 2021-03-15 | Disposition: A | Discharge status: Home / Self Care | Payer: Medicare Other | Attending: Emergency Medicine | Admitting: Emergency Medicine

## 2021-03-15 DIAGNOSIS — R1013 Epigastric pain: Secondary | ICD-10-CM | POA: Insufficient documentation

## 2021-03-15 DIAGNOSIS — R112 Nausea with vomiting, unspecified: Secondary | ICD-10-CM | POA: Diagnosis not present

## 2021-03-15 DIAGNOSIS — F17210 Nicotine dependence, cigarettes, uncomplicated: Secondary | ICD-10-CM | POA: Insufficient documentation

## 2021-03-15 DIAGNOSIS — Z79899 Other long term (current) drug therapy: Secondary | ICD-10-CM | POA: Insufficient documentation

## 2021-03-15 DIAGNOSIS — G8929 Other chronic pain: Secondary | ICD-10-CM | POA: Insufficient documentation

## 2021-03-15 DIAGNOSIS — Z7982 Long term (current) use of aspirin: Secondary | ICD-10-CM | POA: Diagnosis not present

## 2021-03-15 LAB — COMPREHENSIVE METABOLIC PANEL
ALT: 25 U/L (ref 0–44)
AST: 30 U/L (ref 15–41)
Albumin: 3.9 g/dL (ref 3.5–5.0)
Alkaline Phosphatase: 69 U/L (ref 38–126)
Anion gap: 10 (ref 5–15)
BUN: 10 mg/dL (ref 6–20)
CO2: 24 mmol/L (ref 22–32)
Calcium: 9 mg/dL (ref 8.9–10.3)
Chloride: 103 mmol/L (ref 98–111)
Creatinine, Ser: 0.63 mg/dL (ref 0.44–1.00)
GFR, Estimated: >60 mL/min (ref >60)
Glucose, Bld: 118 mg/dL — ABNORMAL HIGH (ref 70–99)
Potassium: 3.5 mmol/L (ref 3.5–5.1)
Sodium: 137 mmol/L (ref 135–145)
Total Bilirubin: 0.4 mg/dL (ref 0.3–1.2)
Total Protein: 7.9 g/dL (ref 6.5–8.1)

## 2021-03-15 LAB — RAPID URINE DRUG SCREEN, HOSP PERFORMED
Amphetamines: NOT DETECTED
Barbiturates: NOT DETECTED
Benzodiazepines: NOT DETECTED
Cocaine: NOT DETECTED
Opiates: NOT DETECTED
Tetrahydrocannabinol: NOT DETECTED

## 2021-03-15 LAB — URINALYSIS, ROUTINE W REFLEX MICROSCOPIC
Bilirubin Urine: NEGATIVE
Glucose, UA: NEGATIVE mg/dL
Hgb urine dipstick: NEGATIVE
Ketones, ur: NEGATIVE mg/dL
Leukocytes,Ua: NEGATIVE
Nitrite: NEGATIVE
Protein, ur: NEGATIVE mg/dL
Specific Gravity, Urine: 1.016 (ref 1.005–1.030)
pH: 5 (ref 5.0–8.0)

## 2021-03-15 LAB — CBC WITH DIFFERENTIAL/PLATELET
Abs Immature Granulocytes: 0.02 10*3/uL (ref 0.00–0.07)
Basophils Absolute: 0 10*3/uL (ref 0.0–0.1)
Basophils Relative: 0 %
Eosinophils Absolute: 0.1 10*3/uL (ref 0.0–0.5)
Eosinophils Relative: 1 %
HCT: 38.7 % (ref 36.0–46.0)
Hemoglobin: 13 g/dL (ref 12.0–15.0)
Immature Granulocytes: 0 %
Lymphocytes Relative: 32 %
Lymphs Abs: 2.3 10*3/uL (ref 0.7–4.0)
MCH: 25.6 pg — ABNORMAL LOW (ref 26.0–34.0)
MCHC: 33.6 g/dL (ref 30.0–36.0)
MCV: 76.3 fL — ABNORMAL LOW (ref 80.0–100.0)
Monocytes Absolute: 0.7 10*3/uL (ref 0.1–1.0)
Monocytes Relative: 10 %
Neutro Abs: 4 10*3/uL (ref 1.7–7.7)
Neutrophils Relative %: 57 %
Platelets: 435 10*3/uL — ABNORMAL HIGH (ref 150–400)
RBC: 5.07 MIL/uL (ref 3.87–5.11)
RDW: 19.7 % — ABNORMAL HIGH (ref 11.5–15.5)
WBC: 7.2 10*3/uL (ref 4.0–10.5)
nRBC: 0 % (ref 0.0–0.2)

## 2021-03-15 LAB — LIPASE, BLOOD: Lipase: 23 U/L (ref 11–51)

## 2021-03-15 LAB — ETHANOL: Alcohol, Ethyl (B): <10 mg/dL (ref <10)

## 2021-03-15 MED ORDER — ONDANSETRON HCL 4 MG/2ML IJ SOLN
4.0000 mg | Freq: Once | INTRAMUSCULAR | Status: AC
  Administered 2021-03-15: 09:00:00 4 mg via INTRAVENOUS
  Filled 2021-03-15: qty 2

## 2021-03-15 MED ORDER — ONDANSETRON HCL 4 MG/2ML IJ SOLN
4.0000 mg | Freq: Once | INTRAMUSCULAR | Status: AC
  Administered 2021-03-15: 11:00:00 4 mg via INTRAVENOUS
  Filled 2021-03-15: qty 2

## 2021-03-15 MED ORDER — FENTANYL CITRATE PF 50 MCG/ML IJ SOSY
50.0000 ug | PREFILLED_SYRINGE | Freq: Once | INTRAMUSCULAR | Status: AC
  Administered 2021-03-15: 09:00:00 50 ug via INTRAVENOUS
  Filled 2021-03-15: qty 1

## 2021-03-15 MED ORDER — FENTANYL CITRATE PF 50 MCG/ML IJ SOSY
50.0000 ug | PREFILLED_SYRINGE | Freq: Once | INTRAMUSCULAR | Status: AC
  Administered 2021-03-15: 11:00:00 50 ug via INTRAVENOUS
  Filled 2021-03-15: qty 1

## 2021-03-15 MED ORDER — LACTATED RINGERS IV BOLUS
1000.0000 mL | Freq: Once | INTRAVENOUS | Status: AC
  Administered 2021-03-15: 09:00:00 1000 mL via INTRAVENOUS

## 2021-03-15 NOTE — ED Triage Notes (Signed)
Pt presents to ED with complaints of mid abdominal pain, feels sharp, nausea and vomiting.

## 2021-03-15 NOTE — ED Provider Notes (Signed)
[facility] EMERGENCY DEPARTMENT Provider Note  Price: [phone] Arrival date & time: 03/15/21 2778    History Chief Complaint  Patient presents with   Abdominal Pain    Angel Price is a 55 y.o. female with history of chronic abdominal pain of unclear etiology reports pain returned about 2 days ago, epigastric, sharp, associated with nausea and vomiting. She reports a history of gastric bypass surgery. She also drinks alcohol occasionally, denies any drug use, specifically marijuana. She was previously on PPI but it was not working so she stopped it. She is still taking carafate.    Past Medical History:  Diagnosis Date   Abscess    soft tissue   Adrenal mass ([place])    Alcohol abuse    Anxiety    Blood transfusion without reported diagnosis    Chronic abdominal pain    Chronic wound infection of abdomen    Colon polyp    colonoscopy 04/2014   Depression    Diverticulosis    colonoscopy 04/2014 moderat pan colonic   Drug-seeking behavior    Gastritis    EGD 05/2014   Gastroparesis Nov 2015   GERD (gastroesophageal reflux disease)    Hemorrhoid    internal large   Hiatal hernia    History of Billroth II operation    Hypertension    Lung nodule    CT 02/2014 needs repeat 1 month   Lung nodule < 6cm on CT 04/25/2014   Lupus (HCC)    Malingering    Nausea and vomiting    chronic, recurrent   Pancreatitis    Schatzki's ring    patent per EGD 04/2014   Sickle cell trait ([place])    Suicide attempt ([place])    Thyroid disease 2000   overactive, radiation    Past Surgical History:  Procedure Laterality Date   ABDOMINAL HYSTERECTOMY  2013   [place]   ABDOMINAL HYSTERECTOMY     ABDOMINAL SURGERY     ADRENALECTOMY Right    AGILE CAPSULE N/A 01/05/2015   Procedure: AGILE CAPSULE;  Surgeon: Daneil Dolin, MD;  Location: [facility];  Service: Endoscopy;  Laterality: N/A;  0700   Billroth II procedure      [place], first 2000, 2005/2006.   bilroth 2     BIOPSY   05/20/2013   Procedure: BIOPSIES OF ASCENDING AND SIGMOID COLON;  Surgeon: Daneil Dolin, MD;  Location: [facility];  Service: Endoscopy;;   BIOPSY  04/26/2014   Procedure: BIOPSIES;  Surgeon: Danie Binder, MD;  Location: [facility];  Service: Endoscopy;;   BIOPSY  12/24/2018   Procedure: BIOPSY;  Surgeon: Daneil Dolin, MD;  [facility];  Service: Endoscopy;;  colon   CHOLECYSTECTOMY     COLONOSCOPY     in [place]   COLONOSCOPY WITH PROPOFOL N/A 05/20/2013   Dr.Rourk- inadequate prep, normal appearing rectum, grossly normal colon aside from pancolonic diverticula, normal terminal ileum bx= unremarkable colonic mucosa. Due for early interval 2016.    COLONOSCOPY WITH PROPOFOL N/A 04/26/2014   EUM:PNTIRW ileum/one colon polyp removed/moderate pan-colonic diverticulosis/large internal hemorrhoids   COLONOSCOPY WITH PROPOFOL N/A 12/23/2014   Dr.Rourk- minimal internal hemorrhoids, pancolonic diverticulosis   COLONOSCOPY WITH PROPOFOL N/A 08/23/2016   Procedure: COLONOSCOPY WITH PROPOFOL;  Surgeon: Danie Binder, MD;  [facility]: [facility];  Service: Endoscopy;  Laterality: N/A;   COLONOSCOPY WITH PROPOFOL N/A 12/24/2018   Pancolonic diverticulosis, normal TI, one 5 mm polyp in rectum (  tubular adenoma). Somewhat friable hemorrhagic mucosa in area of IC valve/cecum, possibly related to scope trauma s/p biopsy.    DEBRIDEMENT OF ABDOMINAL WALL ABSCESS N/A 02/08/2013   Procedure: DEBRIDEMENT OF ABDOMINAL WALL ABSCESS;  Surgeon: Jamesetta So, MD;  Location: [facility];  Service: General;  Laterality: N/A;   ESOPHAGOGASTRODUODENOSCOPY (EGD) WITH PROPOFOL N/A 05/20/2013   Dr.Rourk- s/p prior gastric surgery with normal esophagus, residual gastric mucosa and patent efferent limb   ESOPHAGOGASTRODUODENOSCOPY (EGD) WITH PROPOFOL N/A 02/03/2014   Dr. Gala Romney:  s/p hemigastrectomy with retained gastric contents. Residual gastric mucosa and efferent limb appeared normal otherwise. Query gastroparesis.     ESOPHAGOGASTRODUODENOSCOPY (EGD) WITH PROPOFOL N/A 04/26/2014   JAS:[id number] ring/small HH/mild non-erosive gasrtitis/normal anastomosis   ESOPHAGOGASTRODUODENOSCOPY (EGD) WITH PROPOFOL N/A 12/23/2014   Dr.Rourk- s/p prior hemigastrctomy, active oozing from anastomotic suture site, hemostasis achieved   ESOPHAGOGASTRODUODENOSCOPY (EGD) WITH PROPOFOL N/A 05/23/2016   Dr. 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    ESOPHAGOGASTRODUODENOSCOPY (EGD) WITH PROPOFOL N/A 05/11/2017   Procedure: ESOPHAGOGASTRODUODENOSCOPY (EGD) WITH PROPOFOL;  Surgeon: Daneil Dolin, MD;  [facility];  Service: Endoscopy;  Laterality: N/A;   EXCISION OF MESH  08/2019   HEMORRHOID SURGERY N/A 06/18/2017   Procedure: THREE COLUMN EXTENSIVE HEMORRHOIDECTOMY;  Surgeon: Virl Cagey, MD;  Location: [facility];  Service: General;  Laterality: N/A;   INCISION AND DRAINAGE ABSCESS N/A 01/06/2017   Procedure: INCISION AND DRAINAGE ABDOMINAL WALL ABSCESS;  Surgeon: Aviva Signs, MD;  Location: [facility];  Service: General;  Laterality: N/A;   LEFT HEART CATH AND CORONARY ANGIOGRAPHY N/A 05/09/2020   Procedure: LEFT HEART CATH AND CORONARY ANGIOGRAPHY;  Surgeon: Nigel Mormon, MD;  Location: [facility];  Service: Cardiovascular;  Laterality: N/A;   POLYPECTOMY  12/24/2018   Procedure: POLYPECTOMY;  Surgeon: Daneil Dolin, MD;  [facility];  Service: Endoscopy;;  colon   tendon repar Right    wrist   WOUND EXPLORATION Right 06/24/2013   Procedure: exploration of traumatic wound right wrist;  Surgeon: Tennis Must, MD;  Location: [facility];  Service: Orthopedics;  Laterality: Right;    Family History  Problem Relation Age of Onset   Drug abuse Mother    Lung cancer Father    Cancer Father        mets   Drug abuse Sister    Drug abuse Brother    Breast cancer Maternal Aunt    Bipolar disorder Maternal  Aunt    Drug abuse Maternal Aunt    Colon cancer Maternal Grandmother        late 69s, early 45s   Bipolar disorder Paternal Grandfather    Brain cancer Son    Schizophrenia Son    Cancer Son        brain   Bipolar disorder Cousin    Liver disease Neg Hx     Social History   Tobacco Use   Smoking status: Every Day    Packs/day: 0.25    Years: 35.00    Pack years: 8.75    Types: Cigarettes   Smokeless tobacco: Never  Vaping Use   Vaping Use: Never used  Substance Use Topics   Alcohol use: Yes    Comment: Occasionally   Drug use: Not Currently    Types: Cocaine     Home Medications Prior to Admission medications   Medication Sig Start Date  End Date Taking? Authorizing Provider  acetaminophen (TYLENOL) 500 MG tablet Take 2 tablets (1,000 mg total) by mouth 3 (three) times daily as needed for mild pain or moderate pain. Patient not taking: Reported on 12/08/2020 02/26/19   Enzo Bi, MD  aspirin EC 81 MG EC tablet Take 1 tablet (81 mg total) by mouth daily. 05/19/19   Elgergawy, Silver Huguenin, MD  atenolol (TENORMIN) 100 MG tablet Take 1 tablet (100 mg total) by mouth daily. 08/23/20   Patwardhan, Reynold Bowen, MD  baclofen (LIORESAL) 10 MG tablet Take 10 mg by mouth 2 (two) times daily. 09/22/20   [provider]  Cholecalciferol (VITAMIN D3) 50 MCG (2000 UT) capsule Take 2,000 Units by mouth daily. 10/26/20   [provider]  Cyanocobalamin (B-12) 500 MCG TABS Take 1 tablet by mouth daily. 10/26/20   [provider]  dicyclomine (BENTYL) 20 MG tablet Take 20 mg by mouth every 6 (six) hours as needed. 07/13/20   [provider]  diltiazem (CARDIZEM CD) 240 MG 24 hr capsule TAKE 1 CAPSULE(240 MG) BY MOUTH DAILY 01/22/21   Patwardhan, Manish J, MD  escitalopram (LEXAPRO) 20 MG tablet Take 20 mg by mouth daily. 04/05/20   [provider]  HYDROcodone-acetaminophen (NORCO/VICODIN) 5-325 MG tablet Take 1 tablet by mouth every 6 (six) hours as needed.  07/13/20   [provider]  hydroxychloroquine (PLAQUENIL) 200 MG tablet Take 1.5 tablets (300 mg total) by mouth daily. 03/08/21   Collier Salina, MD  ibuprofen (ADVIL) 800 MG tablet Take 800 mg by mouth 2 (two) times daily as needed. 08/09/20   [provider]  isosorbide mononitrate (IMDUR) 60 MG 24 hr tablet Take 0.5 tablets (30 mg total) by mouth daily. Patient not taking: Reported on 03/08/2021 05/31/20 12/28/20  Nigel Mormon, MD  levocetirizine (XYZAL) 5 MG tablet Take 5 mg by mouth daily. 12/28/20   [provider]  lisinopril-hydrochlorothiazide (ZESTORETIC) 20-12.5 MG tablet Take 1 tablet by mouth every morning. 10/31/20   [provider]  mirtazapine (REMERON) 15 MG tablet Take 15 mg by mouth at bedtime. 04/22/20   [provider]  nicotine polacrilex (NICORETTE) 4 MG gum Take 4 mg by mouth as needed. Patient not taking: Reported on 03/08/2021 09/12/20   [provider]  nitroGLYCERIN (NITROSTAT) 0.4 MG SL tablet Place 1 tablet (0.4 mg total) under the tongue every 5 (five) minutes as needed for chest pain. Patient taking differently: Place 0.4 mg under the tongue every 5 (five) minutes x 3 doses as needed for chest pain. 12/29/19 12/28/20  Patwardhan, Reynold Bowen, MD  ondansetron (ZOFRAN ODT) 4 MG disintegrating tablet Take 1 tablet (4 mg total) by mouth every 8 (eight) hours as needed for nausea or vomiting. Patient not taking: Reported on 12/08/2020 08/30/20   Horton, Barbette Hair, MD  pantoprazole (PROTONIX) 20 MG tablet Take 1 tablet (20 mg total) by mouth daily. Patient not taking: Reported on 12/28/2020 11/07/19   Couture, Cortni S, PA-C  prochlorperazine (COMPAZINE) 10 MG tablet Take 1 tablet (10 mg total) by mouth every 6 (six) hours as needed for nausea or vomiting. 2/97/98   Delora Fuel, MD  promethazine (PHENERGAN) 12.5 MG tablet Take 12.5 mg by mouth every 6 (six) hours as needed for nausea or vomiting.  12/18/19   [provider]  rosuvastatin (CRESTOR) 20 MG tablet Take 1 tablet (20 mg total) by mouth daily. Patient taking differently: Take 20 mg by mouth at bedtime. 06/25/19 11/06/28  Patwardhan, Reynold Bowen, MD     Allergies    Metoclopramide and Reglan [metoclopramide]   Review of Systems   Review of Systems A comprehensive review of systems was completed and negative except as noted in HPI.    Physical Exam BP 118/88    Pulse 90    Temp 98 F (36.7 C) (Oral)    Resp 18    Ht 5\' 4"  (1.626 m)    Wt 69.9 kg    LMP  (LMP Unknown)    SpO2 93%    BMI 26.43 kg/m   Physical Exam Vitals and nursing note reviewed.  Constitutional:      Appearance: Normal appearance.  HENT:     Head: Normocephalic and atraumatic.     Nose: Nose normal.     Mouth/Throat:     Mouth: Mucous membranes are moist.  Eyes:     Extraocular Movements: Extraocular movements intact.     Conjunctiva/sclera: Conjunctivae normal.  Cardiovascular:     Rate and Rhythm: Normal rate.  Pulmonary:     Effort: Pulmonary effort is normal.     Breath sounds: Normal breath sounds.  Abdominal:     General: Abdomen is flat.     Palpations: Abdomen is soft.     Tenderness: There is abdominal tenderness in the epigastric area. There is no guarding. Negative signs include Murphy's sign and McBurney's sign.  Musculoskeletal:        General: No swelling. Normal range of motion.     Cervical back: Neck supple.  Skin:    General: Skin is warm and dry.  Neurological:     General: No focal deficit present.     Mental Status: She is alert.  Psychiatric:        Mood and Affect: Mood normal.     ED Results / Procedures / Treatments   Labs (all labs ordered are listed, but only abnormal results are displayed) Labs Reviewed  COMPREHENSIVE METABOLIC PANEL - Abnormal; Notable for the following components:      Result Value   Glucose, Bld 118 (*)    All other components within normal limits  CBC WITH DIFFERENTIAL/PLATELET - Abnormal;  Notable for the following components:   MCV 76.3 (*)    MCH 25.6 (*)    RDW 19.7 (*)    Platelets 435 (*)    All other components within normal limits  LIPASE, BLOOD  URINALYSIS, ROUTINE W REFLEX MICROSCOPIC  ETHANOL  RAPID URINE DRUG SCREEN, HOSP PERFORMED    EKG None  Radiology No results found.  Procedures Procedures  Medications Ordered in the ED Medications  ondansetron (ZOFRAN) injection 4 mg (4 mg Intravenous Given 03/15/21 0839)  fentaNYL (SUBLIMAZE) injection 50 mcg (50 mcg Intravenous Given 03/15/21 0840)  lactated ringers bolus 1,000 mL (0 mLs Intravenous Stopped 03/15/21 1040)  fentaNYL (SUBLIMAZE) injection 50 mcg (50 mcg Intravenous Given 03/15/21 1052)  ondansetron (ZOFRAN) injection 4 mg (4 mg Intravenous Given 03/15/21 1051)     MDM Rules/Calculators/A&P MDM Patient here with exacerbation of chronic abdominal pain. Abdominal exam is benign. No indication for imaging. Will check labs, IV pain/nausea meds for comfort.   ED Course  I have reviewed the triage vital signs and the nursing notes.  Pertinent labs & imaging results that were available during my care of the patient were reviewed by me and considered in my medical decision making (see chart for details).  Clinical Course as of 03/15/21 1338  Thu Mar 15, 2021  0825 CBC is unremarkable.  [CS]  3383 CMP, lipase and EtOH are neg.  [CS]  1017 UA is normal.  [CS]  1038 UDS is negative. Patient is resting comfortably, denies vomiting but states pain is not improved. With negative labwork, no indication for further ED evaluation. Will give a second dose of pain/nausea meds and discharge with outpatient PCP and GI follow up for long term management. Patient is in agreement.  [CS]    Clinical Course User Index [CS] Truddie Hidden, MD    Final Clinical Impression(s) / ED Diagnoses Final diagnoses:  Chronic abdominal pain    Rx / DC Orders ED Discharge Orders     None        Truddie Hidden, MD 03/15/21 1338

## 2021-03-17 IMAGING — MR MR HEAD W/O CM
11 of 12 series · 41 of 48 positions shown · non-contrast
Comparison: MRI head 05/09/2019

CLINICAL DATA: Acute neuro deficit.  Rule out stroke

EXAM:
MRI HEAD WITHOUT CONTRAST
TECHNIQUE: Multiplanar, multiecho pulse sequences of the brain and surrounding
structures were obtained without intravenous contrast.

[Series 5: DWI · axial · 4.0mm · 0.88mm/px · z∈[-58,+77]mm · 5 of 36 slices shown (1 of 6)]
[im 1/36]
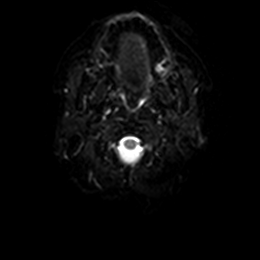
[im 9/36]
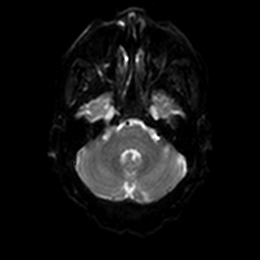
[im 18/36]
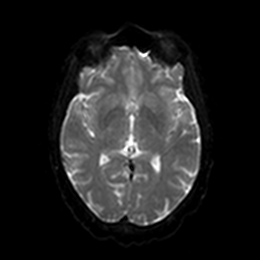
[im 27/36]
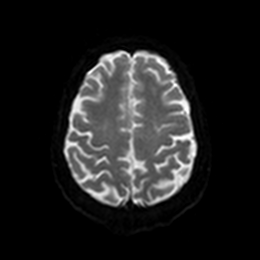
[im 36/36]
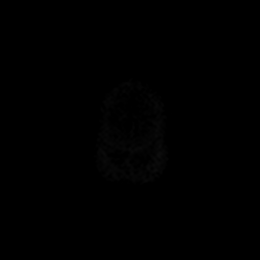

[Series 5: DWI · axial · 4.0mm · 0.88mm/px · z∈[-58,+77]mm · 4 of 36 slices shown (2 of 6)]
[im 1/36]
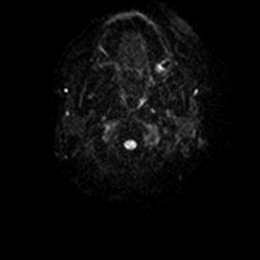
[im 12/36]
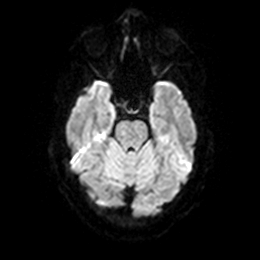
[im 24/36]
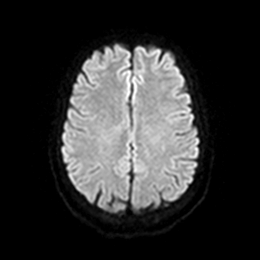
[im 36/36]
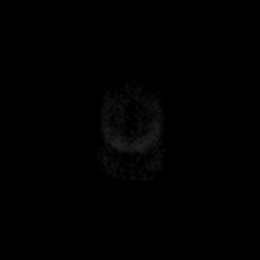

[Series 6: DWI · axial · 4.0mm · 0.88mm/px · z∈[-58,+73]mm · 4 of 35 slices shown (3 of 6)]
[im 1/35]
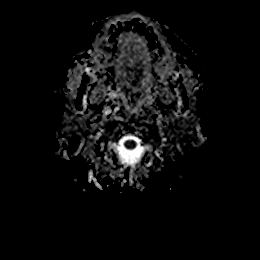
[im 12/35]
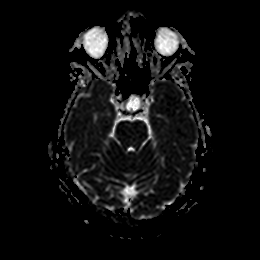
[im 23/35]
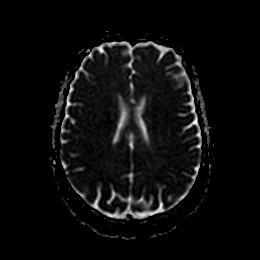
[im 35/35]
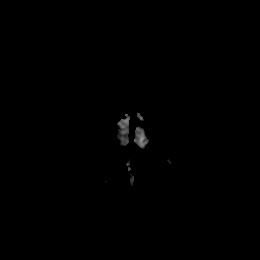

[Series 7: DWI · coronal · 4.0mm · 0.88mm/px · 4 of 32 slices shown (4 of 6)]
[im 1/32]
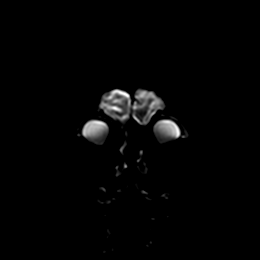
[im 11/32]
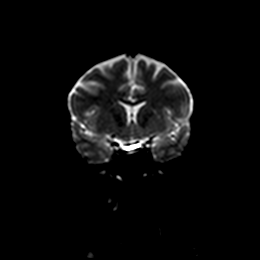
[im 21/32]
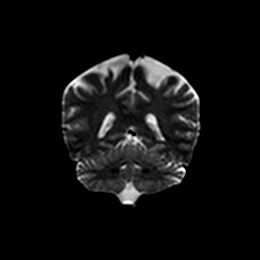
[im 32/32]
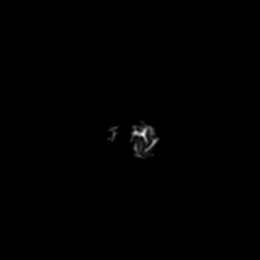

[Series 7: DWI · coronal · 4.0mm · 0.88mm/px · 4 of 32 slices shown (5 of 6)]
[im 1/32]
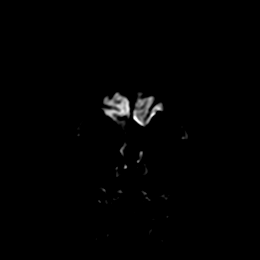
[im 11/32]
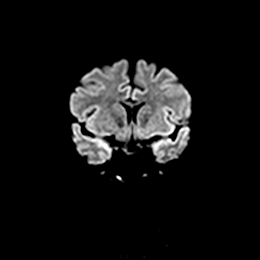
[im 21/32]
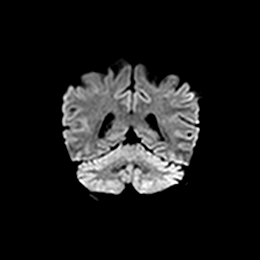
[im 32/32]
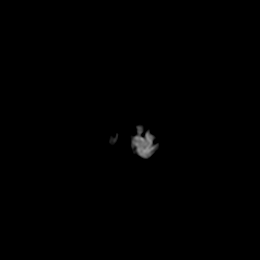

[Series 8: DWI · coronal · 4.0mm · 0.88mm/px · 4 of 31 slices shown (6 of 6)]
[im 1/31]
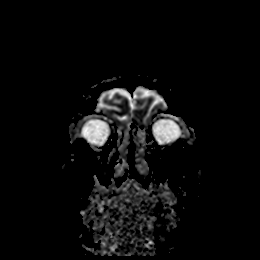
[im 11/31]
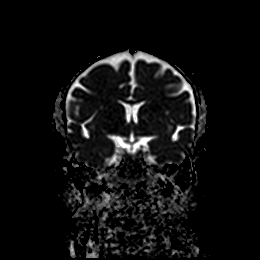
[im 21/31]
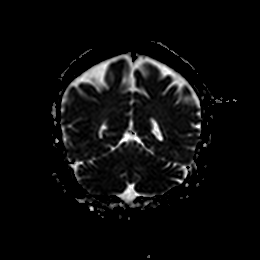
[im 31/31]
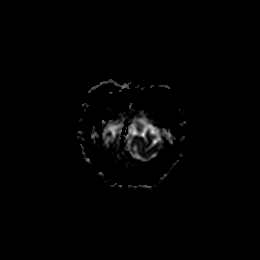

[Series 9: T1 · sagittal · 5.0mm · 0.94mm/px · 3 of 25 slices shown]
[im 1/25]
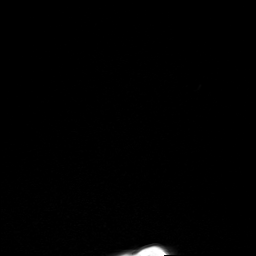
[im 13/25]
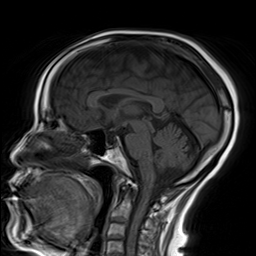
[im 25/25]
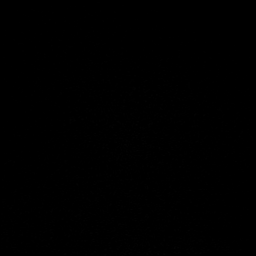

[Series 10: T2 · axial · 5.0mm · 0.72mm/px · z∈[-57,+81]mm · 3 of 25 slices shown (1 of 2)]
[im 1/25]
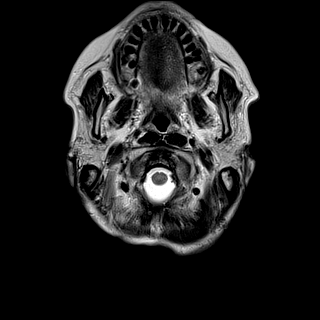
[im 13/25]
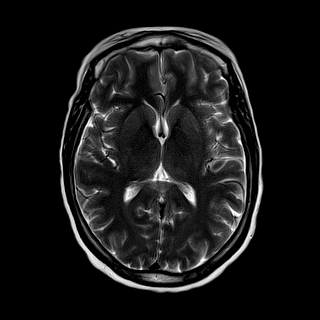
[im 25/25]
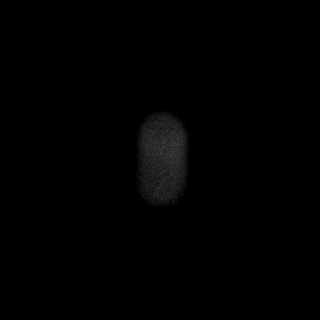

[Series 11: ax hemo · axial · 5.0mm · 0.86mm/px · z∈[-58,+80]mm · 3 of 25 slices shown]
[im 1/25]
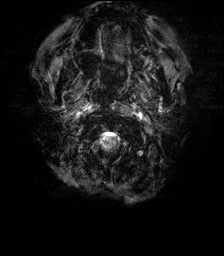
[im 13/25]
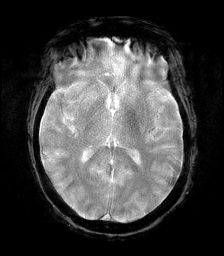
[im 25/25]
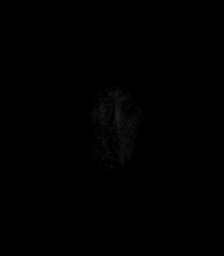

[Series 12: FLAIR · axial · 4.0mm · 0.43mm/px · z∈[-35,+86]mm · 4 of 32 slices shown]
[im 1/32]
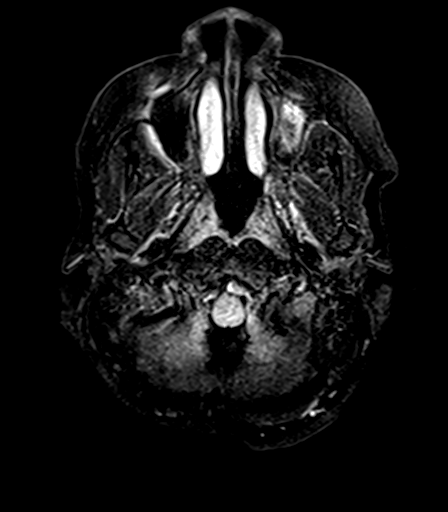
[im 11/32]
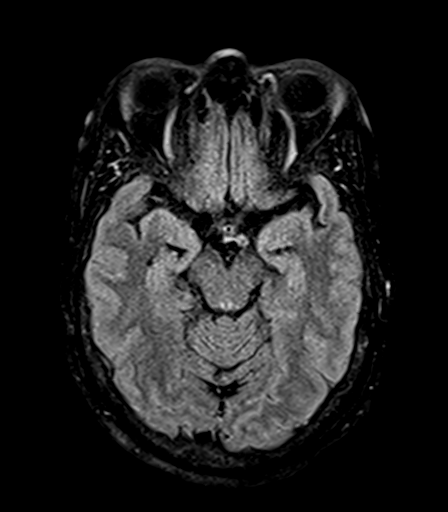
[im 21/32]
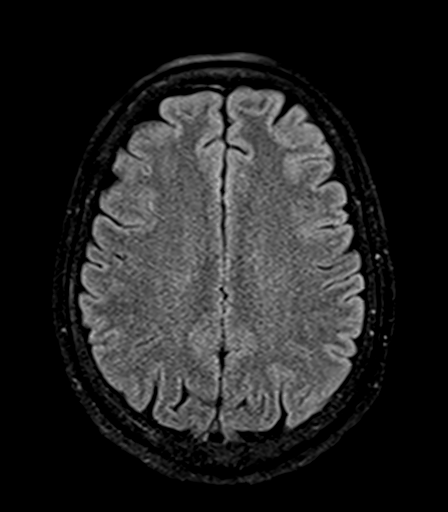
[im 32/32]
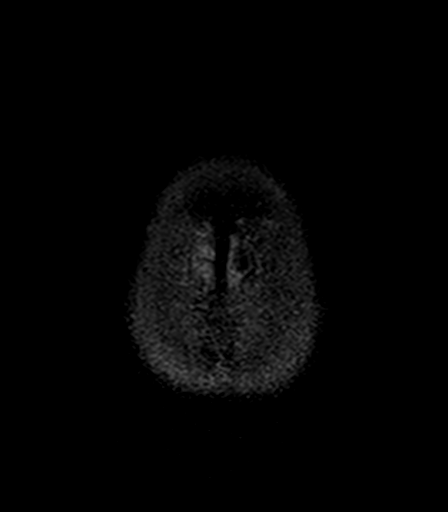

[Series 14: T2 · coronal · 5.0mm · 0.72mm/px · 3 of 28 slices shown (2 of 2)]
[im 1/28]
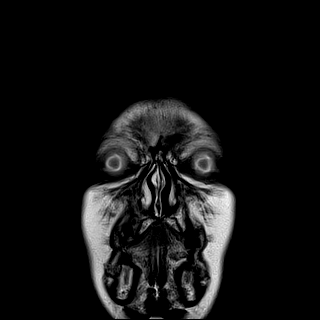
[im 14/28]
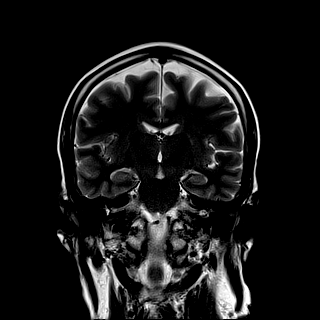
[im 28/28]
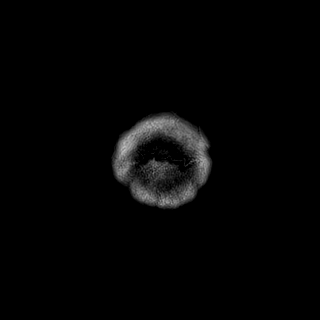

[41 of 48 positions shown; findings below may reference images not displayed]

FINDINGS: Brain: Negative for acute infarct.

No significant chronic ischemia. Ventricle size and cerebral volume
normal. Negative for hemorrhage or mass.

Vascular: Normal arterial flow voids.

Skull and upper cervical spine: No focal skeletal lesion.

Sinuses/Orbits: Paranasal sinuses clear.  Negative orbit

Other: None
IMPRESSION: Normal MRI of the brain.

## 2021-03-23 IMAGING — CT CT ABD-PELV W/O CM
2 of 4 series · 15 of 46 positions shown, 17 images · non-contrast
Comparison: CT of 11/07/2019 and plain film of 11/10/2019.

CLINICAL DATA: Abdominal pain and vomiting for 4 days. IV
extravasation.

EXAM:
CT ABDOMEN AND PELVIS WITHOUT CONTRAST
TECHNIQUE: Multidetector CT imaging of the abdomen and pelvis was performed
following the standard protocol without IV contrast.

[Series 2: axial st · axial · 0.86mm/px · z∈[-492,-62]mm · 12 of 98 slices shown, 14 images]
[im 8/98  soft-tissue]
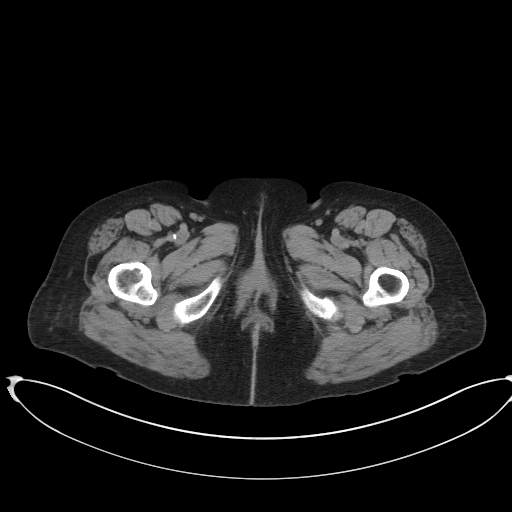
[im 8/98  bone]
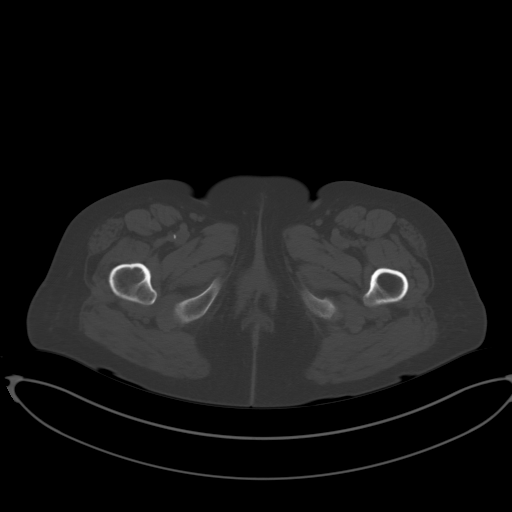
[im 16/98  soft-tissue]
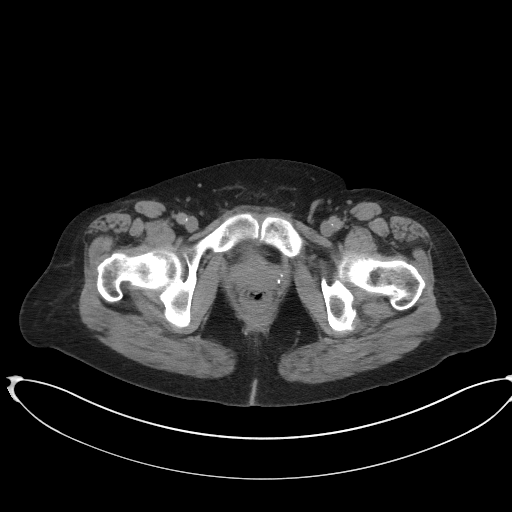
[im 24/98  soft-tissue]
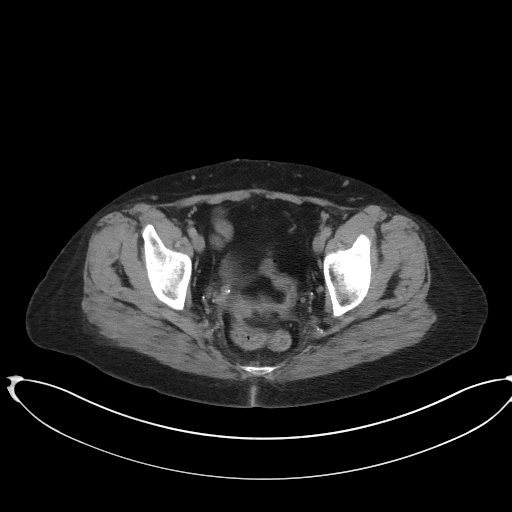
[im 32/98  soft-tissue]
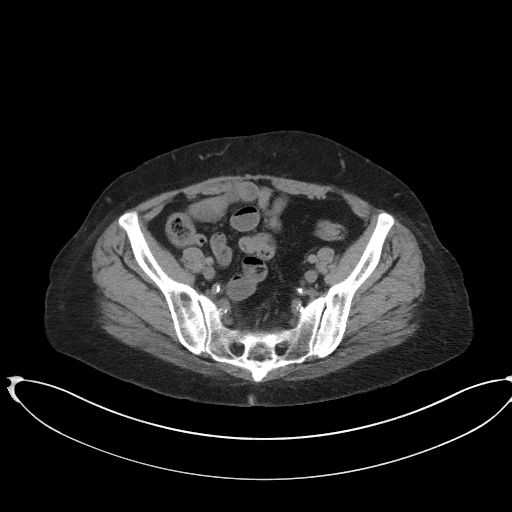
[im 39/98  soft-tissue]
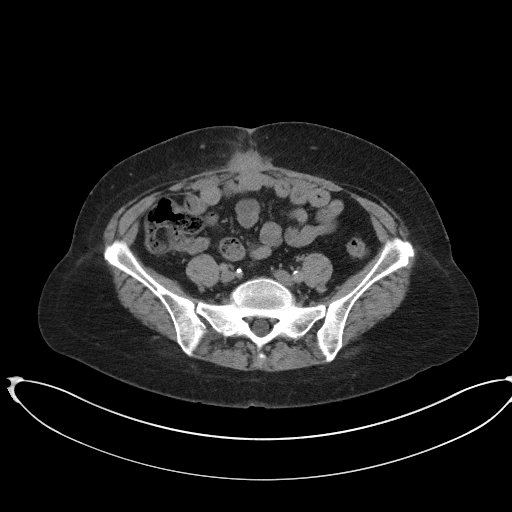
[im 47/98  soft-tissue]
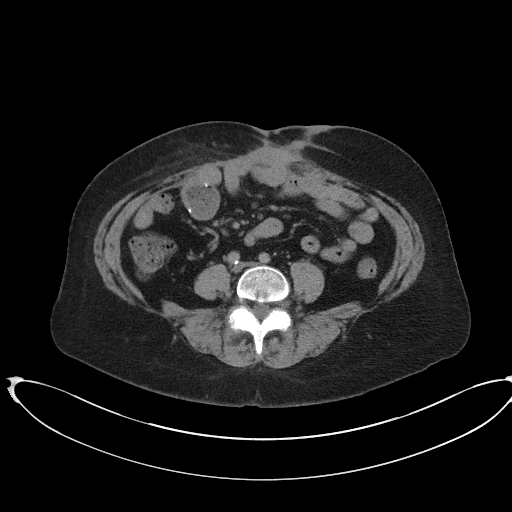
[im 55/98  soft-tissue]
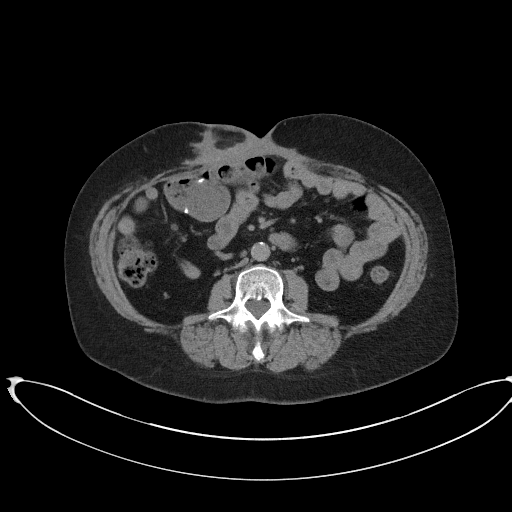
[im 63/98  soft-tissue]
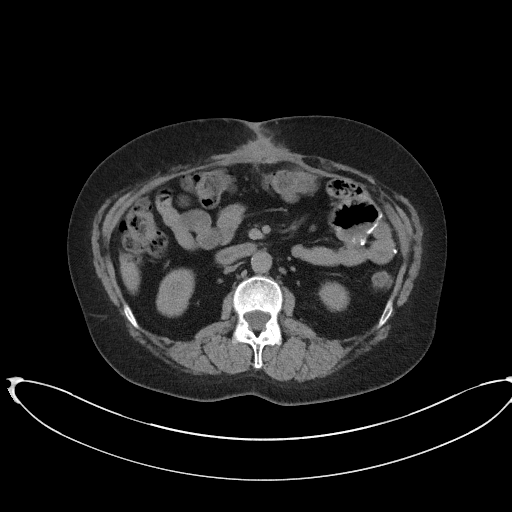
[im 70/98  soft-tissue]
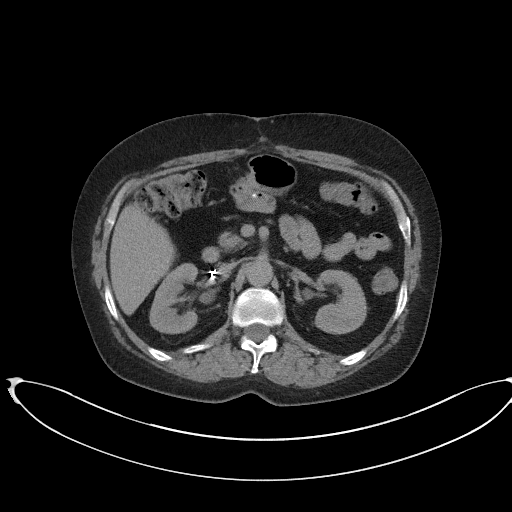
[im 70/98  bone]
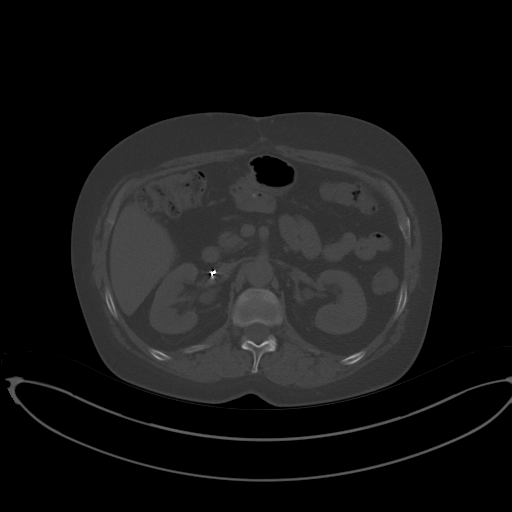
[im 78/98  soft-tissue]
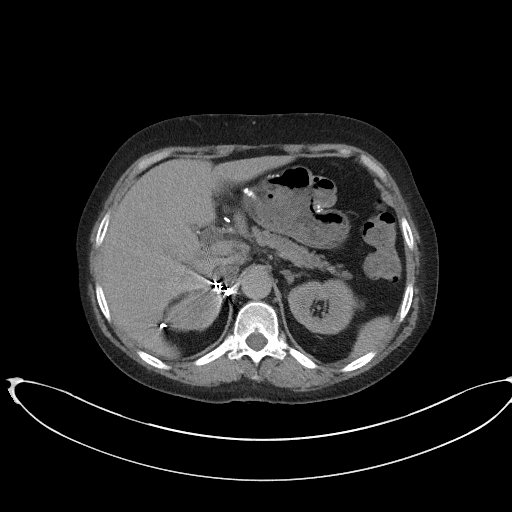
[im 86/98  soft-tissue]
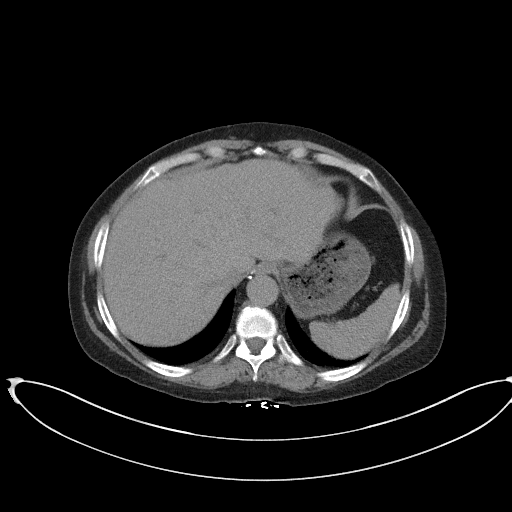
[im 94/98  soft-tissue]
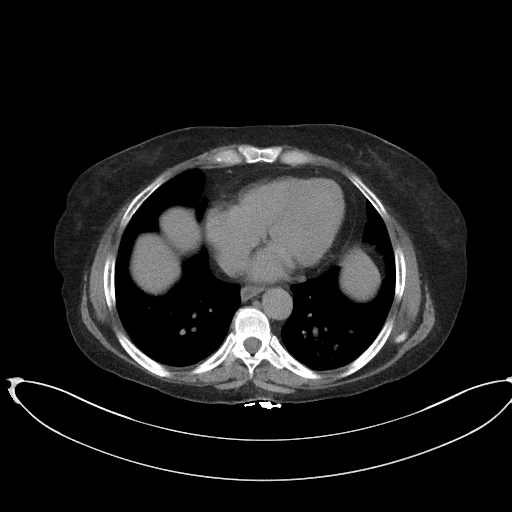

[Series 4: coronal st · coronal · 0.75mm/px · 3 of 87 slices shown]
[im 29/87  soft-tissue]
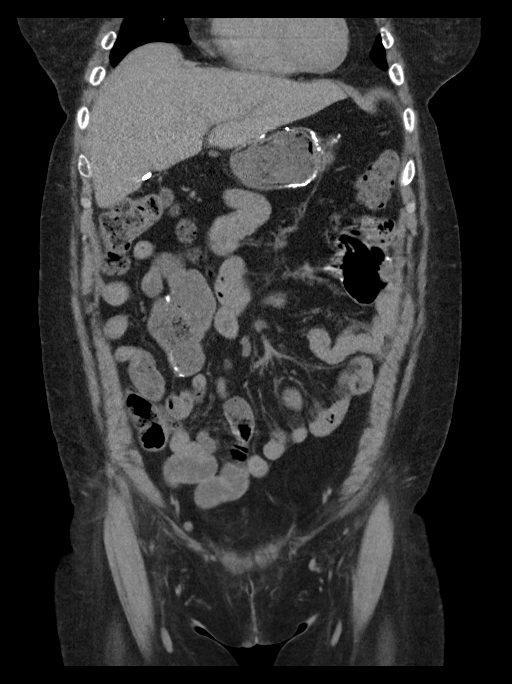
[im 39/87  soft-tissue]
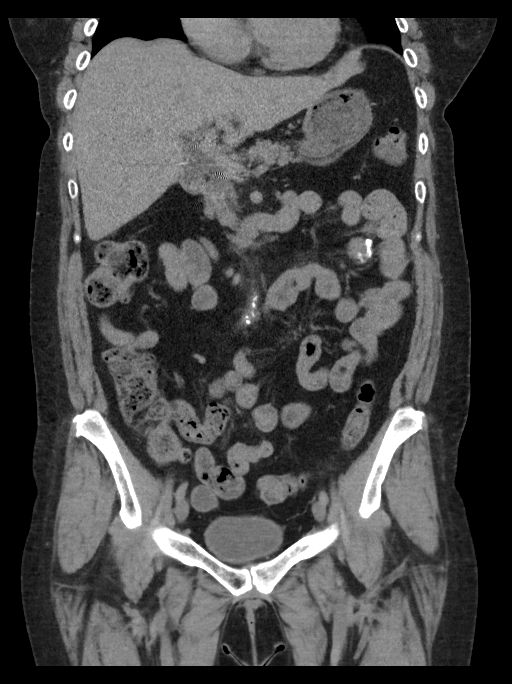
[im 48/87  soft-tissue]
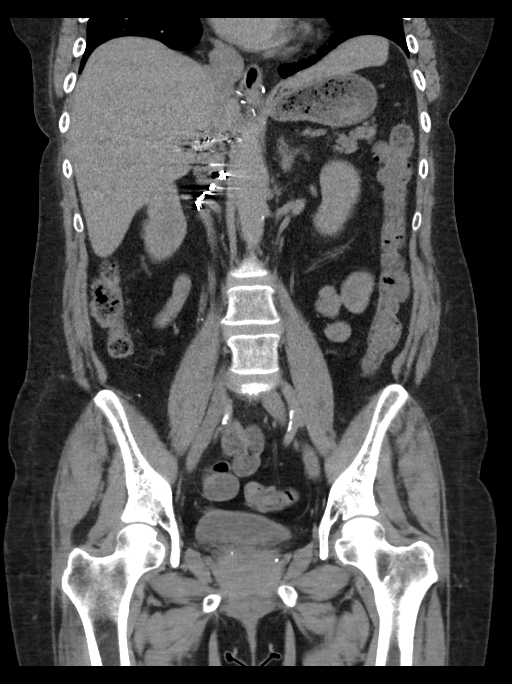

[15 of 46 positions shown; findings below may reference images not displayed]

FINDINGS: Lower chest: Clear lung bases. Borderline cardiomegaly, without
pericardial or pleural effusion. Fluid level in the distal esophagus
with surgical changes at the gastroesophageal junction.

Hepatobiliary: Normal liver. Cholecystectomy, without biliary ductal
dilatation.

Pancreas: Mild pancreatic atrophy, without duct dilatation or acute
inflammation.

Spleen: Normal in size, without focal abnormality.

Adrenals/Urinary Tract: Surgical changes in the right suprarenal
region. Left adrenal 1.8 cm adenoma again identified.

Normal kidneys, without hydronephrosis.  Normal urinary bladder.

Stomach/Bowel: Surgical changes of partial gastrectomy and
gastrojejunostomy. Colonic stool burden suggests constipation.
Normal terminal ileum.

Areas of mild dilatation in the region of prior enterotomy are
likely due to atony. No findings to suggest bowel obstruction.

Anterior position of small bowel loops is again identified, possibly
related to adhesions.

Vascular/Lymphatic: Aortic atherosclerosis. No abdominopelvic
adenopathy.

Reproductive: Hysterectomy.  No adnexal mass.

Other: No significant free fluid.  No free intraperitoneal air.

Soft tissue thickening within the anterior abdominal wall again
identified, consistent with extensive postoperative scarring. The
tiny seroma detailed on the prior exam is not apparent on this
noncontrast study.

Musculoskeletal: No acute osseous abnormality.
IMPRESSION: 1. No acute process in the abdomen or pelvis.
2. Possible constipation.
3. Esophageal air fluid level suggests dysmotility or
gastroesophageal reflux.
4. Left adrenal adenoma.
5. Aortic Atherosclerosis ([id number]).
6. Extensive postsurgical changes, including gastrojejunostomy.

## 2021-03-30 ENCOUNTER — Ambulatory Visit: Payer: Medicare Other | Admitting: Internal Medicine

## 2021-03-30 ENCOUNTER — Encounter: Payer: Self-pay | Admitting: Internal Medicine

## 2021-05-14 NOTE — Progress Notes (Signed)
Primary Care Physician: Carrolyn Meiers, MD  Primary Gastroenterologist:  Garfield Cornea, MD   Chief Complaint  Patient presents with   Nausea   Emesis   Diarrhea    Episodes of diarrhea and constipation   Abdominal Pain    HPI: Angel Price is a 56 y.o. female here for follow-up.  Patient was last seen in December 2020.  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  Since we last saw the patient she states she had surgery in [place] to remove infected mesh and repair hernia in June 2021.  She states she had chronic infection which led to her abdominal wall fistulas and abscesses.  Since that time she has been doing much better.  Unfortunately she did develop the right ventral hernia after that surgery.  Last saw her surgeon August 2022.  She was doing well up until a few months ago.  She had gained 20 pounds since 2020.  Few months ago she started having recurrent nausea/vomiting, worse after meals.  If she bends over, food comes up.  When she is eating she feels like food is getting stuck in her chest and often she will vomit.  After meals she feels like her food feels all the way up her chest.  Occasionally sees streaks of blood in her emesis.  Complains of solid food and pill dysphagia.  At times has severe epigastric pain which buckles her over.  She has daily abdominal pain.  Sometimes wakes her up at night.  She tries to use a heating pad but nothing really seems to help.  Pain seems to be worse after meals.  Her bowel movements are all liquid/watery.  She wonders if she is obstructed.  Never passes any solid stool.  Last formed bowel movement a few months ago.  Denies any blood in stool or melena.  Denies any antidiarrheal medications.  She saw Dr. Earley Brooke since she was  living in [place].  Recently completed upper GI series as outlined below.  Patient states she does not want to follow back up with him, did not find him helpful.     EGD 2019 - Normal esophagus. surgically altered stomach. Previously placed hemostasis clips persist. - Small hiatal hernia. - No specimens collected. no suspicious bleeding lesion found. Hemoglobin remained stable at 9.6 from yestday. I suspect lower GI tract etiology - either hemorrhoids or diverticulosi  Colonoscopy in August 2020 showed pancolonic diverticulosis, normal TI, one 5 mm polyp in the rectum which was a tubular adenoma.  Somewhat friable hemorrhagic mucosa in the area of the ICV/cecum, possibly related to scope trauma status post biopsy, no significant pathologic findings.  Previously followed by Dr. Tye Maryland for chronic fistulous.  CT A/P without contrast 07/2020: gastrectomy gastrojejunostomy, wall thickening about the enterostomy site in the left abdomen could be due to underdistention or possibly inflammatory change. Some small bowel loops appear adhesed to the anterior abdominal wall. Since prior exam, new hernia to right of midline which contains loops of small and large bowel without obstruction. Defect measures 5X11cm.  Upper GI series May 04, 2021: Postsurgical changes after Billroth II without evidence of anastomotic stricture or complicating features.  No evidence of esophageal anatomic abnormality or dysmotility.    Current Outpatient Medications  Medication Sig Dispense Refill   aspirin EC 81 MG EC tablet  Take 1 tablet (81 mg total) by mouth daily. 30 tablet 0   atenolol (TENORMIN) 100 MG tablet Take 1 tablet (100 mg total) by mouth daily. 60 tablet 2   baclofen (LIORESAL) 10 MG tablet Take 10 mg by mouth 2 (two) times daily.     Cholecalciferol (VITAMIN D3) 50 MCG (2000 UT) capsule Take 2,000 Units by mouth daily.     Cyanocobalamin (B-12) 500 MCG TABS Take 1 tablet by mouth daily.     diltiazem  (CARDIZEM CD) 240 MG 24 hr capsule TAKE 1 CAPSULE(240 MG) BY MOUTH DAILY 30 capsule 3   escitalopram (LEXAPRO) 20 MG tablet Take 20 mg by mouth daily.     hydroxychloroquine (PLAQUENIL) 200 MG tablet Take 1.5 tablets (300 mg total) by mouth daily. 135 tablet 1   isosorbide mononitrate (IMDUR) 60 MG 24 hr tablet Take 0.5 tablets (30 mg total) by mouth daily. 90 tablet 3   levocetirizine (XYZAL) 5 MG tablet Take 5 mg by mouth daily.     lisinopril-hydrochlorothiazide (ZESTORETIC) 20-12.5 MG tablet Take 1 tablet by mouth every morning.     mirtazapine (REMERON) 15 MG tablet Take 15 mg by mouth at bedtime.     nitroGLYCERIN (NITROSTAT) 0.4 MG SL tablet Place 1 tablet (0.4 mg total) under the tongue every 5 (five) minutes as needed for chest pain. (Patient taking differently: Place 0.4 mg under the tongue every 5 (five) minutes x 3 doses as needed for chest pain.) 30 tablet 3   pantoprazole (PROTONIX) 20 MG tablet Take 1 tablet (20 mg total) by mouth daily. 30 tablet 0   prochlorperazine (COMPAZINE) 10 MG tablet Take 1 tablet (10 mg total) by mouth every 6 (six) hours as needed for nausea or vomiting. 20 tablet 0   promethazine (PHENERGAN) 12.5 MG tablet Take 12.5 mg by mouth every 6 (six) hours as needed for nausea or vomiting.      rosuvastatin (CRESTOR) 20 MG tablet Take 1 tablet (20 mg total) by mouth daily. (Patient taking differently: Take 20 mg by mouth at bedtime.) 90 tablet 3   No current facility-administered medications for this visit.    Allergies as of 05/15/2021 - Review Complete 05/15/2021  Allergen Reaction Noted   Metoclopramide Other (See Comments) and Anaphylaxis 08/08/2016   Reglan [metoclopramide] Other (See Comments) 05/19/2019   Past Medical History:  Diagnosis Date   Abscess    soft tissue   Adrenal mass ([place])    Alcohol abuse    Anxiety    Blood transfusion without reported diagnosis    Chronic abdominal pain    Chronic wound infection of abdomen    Colon polyp     colonoscopy 04/2014   Depression    Diverticulosis    colonoscopy 04/2014 moderat pan colonic   Drug-seeking behavior    Gastritis    EGD 05/2014   Gastroparesis Nov 2015   GERD (gastroesophageal reflux disease)    Hemorrhoid    internal large   Hiatal hernia    History of Billroth II operation    Hypertension    Lung nodule    CT 02/2014 needs repeat 1 month   Lung nodule < 6cm on CT 04/25/2014   Lupus ([facility])    Malingering    Nausea and vomiting    chronic, recurrent   Pancreatitis    Schatzki's ring    patent per EGD 04/2014   Sickle cell trait ([place])    Suicide attempt ([place])    Thyroid disease 2000  overactive, radiation   Past Surgical History:  Procedure Laterality Date   ABDOMINAL HYSTERECTOMY  2013   [place]   ABDOMINAL HYSTERECTOMY     ABDOMINAL SURGERY     ADRENALECTOMY Right    AGILE CAPSULE N/A 01/05/2015   Procedure: AGILE CAPSULE;  Surgeon: Daneil Dolin, MD;  [facility];  Service: Endoscopy;  Laterality: N/A;  0700   Billroth II procedure      [place], first 2000, 2005/2006.   bilroth 2     BIOPSY  05/20/2013   Procedure: BIOPSIES OF ASCENDING AND SIGMOID COLON;  Surgeon: Daneil Dolin, MD;  Location: [facility];  Service: Endoscopy;;   BIOPSY  04/26/2014   Procedure: BIOPSIES;  Surgeon: Danie Binder, MD;  Location: [facility];  Service: Endoscopy;;   BIOPSY  12/24/2018   Procedure: BIOPSY;  Surgeon: Daneil Dolin, MD;  [facility];  Service: Endoscopy;;  colon   CHOLECYSTECTOMY     COLONOSCOPY     in [place]   COLONOSCOPY WITH PROPOFOL N/A 05/20/2013   Dr.Rourk- inadequate prep, normal appearing rectum, grossly normal colon aside from pancolonic diverticula, normal terminal ileum bx= unremarkable colonic mucosa. Due for early interval 2016.    COLONOSCOPY WITH PROPOFOL N/A 04/26/2014   JSE:GBTDVV ileum/one colon polyp removed/moderate pan-colonic diverticulosis/large internal hemorrhoids   COLONOSCOPY WITH PROPOFOL N/A 12/23/2014    Dr.Rourk- minimal internal hemorrhoids, pancolonic diverticulosis   COLONOSCOPY WITH PROPOFOL N/A 08/23/2016   Procedure: COLONOSCOPY WITH PROPOFOL;  Surgeon: Danie Binder, MD;  [facility];  Service: Endoscopy;  Laterality: N/A;   COLONOSCOPY WITH PROPOFOL N/A 12/24/2018   Pancolonic diverticulosis, normal TI, one 5 mm polyp in rectum (tubular adenoma). Somewhat friable hemorrhagic mucosa in area of IC valve/cecum, possibly related to scope trauma s/p biopsy.    DEBRIDEMENT OF ABDOMINAL WALL ABSCESS N/A 02/08/2013   Procedure: DEBRIDEMENT OF ABDOMINAL WALL ABSCESS;  Surgeon: Jamesetta So, MD;  Location: [facility];  Service: General;  Laterality: N/A;   ESOPHAGOGASTRODUODENOSCOPY (EGD) WITH PROPOFOL N/A 05/20/2013   Dr.Rourk- s/p prior gastric surgery with normal esophagus, residual gastric mucosa and patent efferent limb   ESOPHAGOGASTRODUODENOSCOPY (EGD) WITH PROPOFOL N/A 02/03/2014   Dr. Gala Romney:  s/p hemigastrectomy with retained gastric contents. Residual gastric mucosa and efferent limb appeared normal otherwise. Query gastroparesis.    ESOPHAGOGASTRODUODENOSCOPY (EGD) WITH PROPOFOL N/A 04/26/2014   OHY:[id number]'I ring/small HH/mild non-erosive gasrtitis/normal anastomosis   ESOPHAGOGASTRODUODENOSCOPY (EGD) WITH PROPOFOL N/A 12/23/2014   Dr.Rourk- s/p prior hemigastrctomy, active oozing from anastomotic suture site, hemostasis achieved   ESOPHAGOGASTRODUODENOSCOPY (EGD) WITH PROPOFOL N/A 05/23/2016   Dr. 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    ESOPHAGOGASTRODUODENOSCOPY (EGD) WITH PROPOFOL N/A 05/11/2017   Procedure: ESOPHAGOGASTRODUODENOSCOPY (EGD) WITH PROPOFOL;  Surgeon: Daneil Dolin, MD;  [facility];  Service: Endoscopy;  Laterality: N/A;   EXCISION OF MESH  08/2019   HEMORRHOID SURGERY N/A 06/18/2017   Procedure: THREE COLUMN EXTENSIVE HEMORRHOIDECTOMY;  Surgeon: Virl Cagey, MD;  Location: [facility];  Service: General;  Laterality: N/A;   INCISION AND DRAINAGE ABSCESS N/A 01/06/2017   Procedure: INCISION AND DRAINAGE ABDOMINAL WALL ABSCESS;  Surgeon: Aviva Signs, MD;  Location: [facility];  Service: General;  Laterality: N/A;   LEFT HEART CATH AND CORONARY ANGIOGRAPHY N/A 05/09/2020   Procedure: LEFT HEART CATH AND CORONARY ANGIOGRAPHY;  Surgeon: Nigel Mormon, MD;  Location: [facility];  Service: Cardiovascular;  Laterality: N/A;   POLYPECTOMY  12/24/2018   Procedure: POLYPECTOMY;  Surgeon: Daneil Dolin, MD;  [facility];  Service: Endoscopy;;  colon   tendon repar Right    wrist   WOUND EXPLORATION Right 06/24/2013   Procedure: exploration of traumatic wound right wrist;  Surgeon: Tennis Must, MD;  Location: [facility];  Service: Orthopedics;  Laterality: Right;   Family History  Problem Relation Age of Onset   Drug abuse Mother    Lung cancer Father    Cancer Father        mets   Drug abuse Sister    Drug abuse Brother    Breast cancer Maternal Aunt    Bipolar disorder Maternal Aunt    Drug abuse Maternal Aunt    Colon cancer Maternal Grandmother        late 2s, early 75s   Bipolar disorder Paternal Grandfather    Brain cancer Son    Schizophrenia Son    Cancer Son        brain   Bipolar disorder Cousin    Liver disease Neg Hx    Social History   Tobacco Use   Smoking status: Every Day    Packs/day: 0.50    Years: 35.00    Pack years: 17.50    Types: Cigarettes   Smokeless tobacco: Never  Vaping Use   Vaping Use: Never used  Substance Use Topics   Alcohol use: Not Currently    Comment: Occasionally, 1-2 beers on occasion.   Drug use: Not Currently    Types: Cocaine    Comment: no cocaine in 3 years per patient (05/15/21)    ROS:  General: Negative for anorexia, weight loss, fever, chills, fatigue, weakness. ENT: Negative for hoarseness, difficulty swallowing , nasal congestion. CV: Negative for chest  pain, angina, palpitations, dyspnea on exertion, peripheral edema.  Respiratory: Negative for dyspnea at rest, dyspnea on exertion, cough, sputum, wheezing.  GI: See history of present illness. GU:  Negative for dysuria, hematuria, urinary incontinence, urinary frequency, nocturnal urination.  Endo: Negative for unusual weight change.    Physical Examination:   BP (!) 160/90    Pulse 63    Temp (!) 97.1 F (36.2 C)    Ht 5\' 3"  (1.6 m)    Wt 153 lb 6.4 oz (69.6 kg)    LMP  (LMP Unknown)    SpO2 98%    BMI 27.17 kg/m   General: Well-nourished, well-developed in no acute distress.  Eyes: No icterus. Mouth:masked Lungs: Clear to auscultation bilaterally.  Heart: Regular rate and rhythm, no murmurs rubs or gallops.  Abdomen: Bowel sounds are normal,   nondistended, no hepatosplenomegaly or masses, no abdominal bruits, no rebound or guarding.  Multiple incisions and scars noted on the abdomen.  She has mild diffuse tenderness throughout the central abdomen, increased tenderness over her right ventral hernia, moderate sized.  When she sits up hernia protrudes significantly but is easily reducible. Extremities: No lower extremity edema. No clubbing or deformities. Neuro: Alert and oriented x 4   Skin: Warm and dry, no jaundice.   Psych: Alert and cooperative, normal mood and affect.  Labs:  Lab Results  Component Value Date   CREATININE 0.63 03/15/2021   BUN 10 03/15/2021   NA 137 03/15/2021   K 3.5 03/15/2021   CL 103 03/15/2021   CO2 24 03/15/2021   Lab Results  Component Value Date   ALT  25 03/15/2021   AST 30 03/15/2021   ALKPHOS 69 03/15/2021   BILITOT 0.4 03/15/2021   Lab Results  Component Value Date   WBC 7.2 03/15/2021   HGB 13.0 03/15/2021   HCT 38.7 03/15/2021   MCV 76.3 (L) 03/15/2021   PLT 435 (H) 03/15/2021   Lab Results  Component Value Date   LIPASE 23 03/15/2021   Lab Results  Component Value Date   ESRSEDRATE 2 03/08/2021   Lab Results  Component  Value Date   CRP 0.4 12/08/2020     Imaging Studies: No results found.   Assessment:  N/V: Recurrent intractable nausea/vomiting with prior Billroth II configuration.  Recent upper GI series without stricture.  CT imaging last May with wall thickening about the enterostomy site in the left abdomen could be due to underdistention or possibly inflammatory change. Some small bowel loops appear adhesed to the anterior abdominal wall. Since prior exam, new hernia to right of midline which contains loops of small and large bowel without obstruction. Defect measures 5X11cm. She may have delayed gastric emptying versus ulceration versus functional obstruction.  Less likely secondary to pancreatitis.  Esophageal dysphagia: No obvious esophageal abnormality on upper GI series recently.  Symptoms may be secondary to delayed emptying of the stomach but cannot rule out esophageal stricture or web.  Abdominal pain: Somewhat generalized but also at site of right ventral hernia.  She has had numerous surgeries and known small bowel loops adhesed to the anterior abdominal wall.  Pain likely multifactorial.  We will try to obtain the op note from her last surgery for mesh/hernia repair.  Obtain abdominal x-ray two-view.  Diarrhea: No evidence of fecal impaction in the rectum but will evaluate to make sure she is not infected higher up.  States she has passed no solid stool in months.  All of her stool is watery and she feels like she has pressure and is constipated.  If no significant stool load on exam, will check stool studies.   Plan: Follow up with surgeon regarding ventral hernia repair when appropriate.  She was last seen in August 2022 Labs to evaluate abdominal pain. Abdominal x-ray, 2 view to evaluate vomiting, hernia, assess stool load and rule out obstruction. If abdominal x-ray and labs unremarkable, we will move forward towards upper endoscopy plus or minus esophageal dilation with propofol, ASA  3.    Addendum: After patient left the office, I was able to review records in care everywhere.  She underwent surgery to remove infected mesh and complete primary repair of her hernia June 2021.  She developed recurrent incisional hernia and saw her surgeon August 2022 for possible repair.  She was advised to quit smoking prior to surgery to avoid another mesh infection she was referred to smoking cessation clinic.

## 2021-05-15 ENCOUNTER — Ambulatory Visit (HOSPITAL_COMMUNITY)
Admission: RE | Admit: 2021-05-15 | Discharge: 2021-05-15 | Disposition: A | Discharge status: Home / Self Care | Payer: Medicare Other | Source: Ambulatory Visit | Attending: Gastroenterology | Admitting: Gastroenterology

## 2021-05-15 ENCOUNTER — Other Ambulatory Visit: Payer: Self-pay

## 2021-05-15 ENCOUNTER — Encounter: Payer: Self-pay | Admitting: Gastroenterology

## 2021-05-15 ENCOUNTER — Ambulatory Visit (INDEPENDENT_AMBULATORY_CARE_PROVIDER_SITE_OTHER): Payer: Medicare Other | Admitting: Gastroenterology

## 2021-05-15 VITALS — BP 160/90 | HR 63 | Temp 97.1°F | Ht 63.0 in | Wt 153.4 lb

## 2021-05-15 DIAGNOSIS — R109 Unspecified abdominal pain: Secondary | ICD-10-CM | POA: Insufficient documentation

## 2021-05-15 DIAGNOSIS — R112 Nausea with vomiting, unspecified: Secondary | ICD-10-CM | POA: Diagnosis present

## 2021-05-15 DIAGNOSIS — R1319 Other dysphagia: Secondary | ICD-10-CM | POA: Insufficient documentation

## 2021-05-15 DIAGNOSIS — A09 Infectious gastroenteritis and colitis, unspecified: Secondary | ICD-10-CM | POA: Insufficient documentation

## 2021-05-15 DIAGNOSIS — R1310 Dysphagia, unspecified: Secondary | ICD-10-CM | POA: Insufficient documentation

## 2021-05-15 DIAGNOSIS — K439 Ventral hernia without obstruction or gangrene: Secondary | ICD-10-CM | POA: Insufficient documentation

## 2021-05-15 NOTE — Patient Instructions (Addendum)
Please have labs done. Please have abdominal xray done. We will be in touch with results as available. We will plan for upper endoscopy to evaluate your swallowing issues and vomiting if your labs and xray are unremarkable.

## 2021-05-16 ENCOUNTER — Telehealth: Payer: Self-pay | Admitting: Internal Medicine

## 2021-05-16 LAB — COMPREHENSIVE METABOLIC PANEL
ALT: 12 IU/L (ref 0–32)
AST: 16 IU/L (ref 0–40)
Albumin/Globulin Ratio: 1.5 (ref 1.2–2.2)
Albumin: 4.5 g/dL (ref 3.8–4.9)
Alkaline Phosphatase: 85 IU/L (ref 44–121)
BUN/Creatinine Ratio: 13 (ref 9–23)
BUN: 7 mg/dL (ref 6–24)
Bilirubin Total: 0.5 mg/dL (ref 0.0–1.2)
CO2: 20 mmol/L (ref 20–29)
Calcium: 9.4 mg/dL (ref 8.7–10.2)
Chloride: 107 mmol/L — ABNORMAL HIGH (ref 96–106)
Creatinine, Ser: 0.56 mg/dL — ABNORMAL LOW (ref 0.57–1.00)
Globulin, Total: 3 g/dL (ref 1.5–4.5)
Glucose: 94 mg/dL (ref 70–99)
Potassium: 4.3 mmol/L (ref 3.5–5.2)
Sodium: 144 mmol/L (ref 134–144)
Total Protein: 7.5 g/dL (ref 6.0–8.5)
eGFR: 109 mL/min/{1.73_m2} (ref >59)

## 2021-05-16 LAB — CBC WITH DIFFERENTIAL/PLATELET
Basophils Absolute: 0 10*3/uL (ref 0.0–0.2)
Basos: 1 %
EOS (ABSOLUTE): 0.1 10*3/uL (ref 0.0–0.4)
Eos: 2 %
Hematocrit: 37.4 % (ref 34.0–46.6)
Hemoglobin: 12.1 g/dL (ref 11.1–15.9)
Immature Grans (Abs): 0 10*3/uL (ref 0.0–0.1)
Immature Granulocytes: 0 %
Lymphocytes Absolute: 3.2 10*3/uL — ABNORMAL HIGH (ref 0.7–3.1)
Lymphs: 52 %
MCH: 24.6 pg — ABNORMAL LOW (ref 26.6–33.0)
MCHC: 32.4 g/dL (ref 31.5–35.7)
MCV: 76 fL — ABNORMAL LOW (ref 79–97)
Monocytes Absolute: 0.5 10*3/uL (ref 0.1–0.9)
Monocytes: 8 %
Neutrophils Absolute: 2.3 10*3/uL (ref 1.4–7.0)
Neutrophils: 37 %
Platelets: 353 10*3/uL (ref 150–450)
RBC: 4.91 x10E6/uL (ref 3.77–5.28)
RDW: 20.2 % — ABNORMAL HIGH (ref 11.7–15.4)
WBC: 6.1 10*3/uL (ref 3.4–10.8)

## 2021-05-16 LAB — LIPASE: Lipase: 14 U/L (ref 14–72)

## 2021-05-16 NOTE — Telephone Encounter (Signed)
Pt calling to see if her results from yesterday are back. I told her we probably don't have the results that quick, but she is wanting to know what the next step was. I told her the nurse would call. [phone] ?

## 2021-05-17 ENCOUNTER — Other Ambulatory Visit: Payer: Self-pay

## 2021-05-17 DIAGNOSIS — A09 Infectious gastroenteritis and colitis, unspecified: Secondary | ICD-10-CM

## 2021-05-17 IMAGING — DX DG CHEST 1V PORT
1 series · 1 of 1 positions shown · non-contrast
Comparison: CT 06/15/2019, radiograph 06/15/2019

CLINICAL DATA: Chest and abdominal pain

EXAM:
PORTABLE CHEST 1 VIEW

[chest ap]
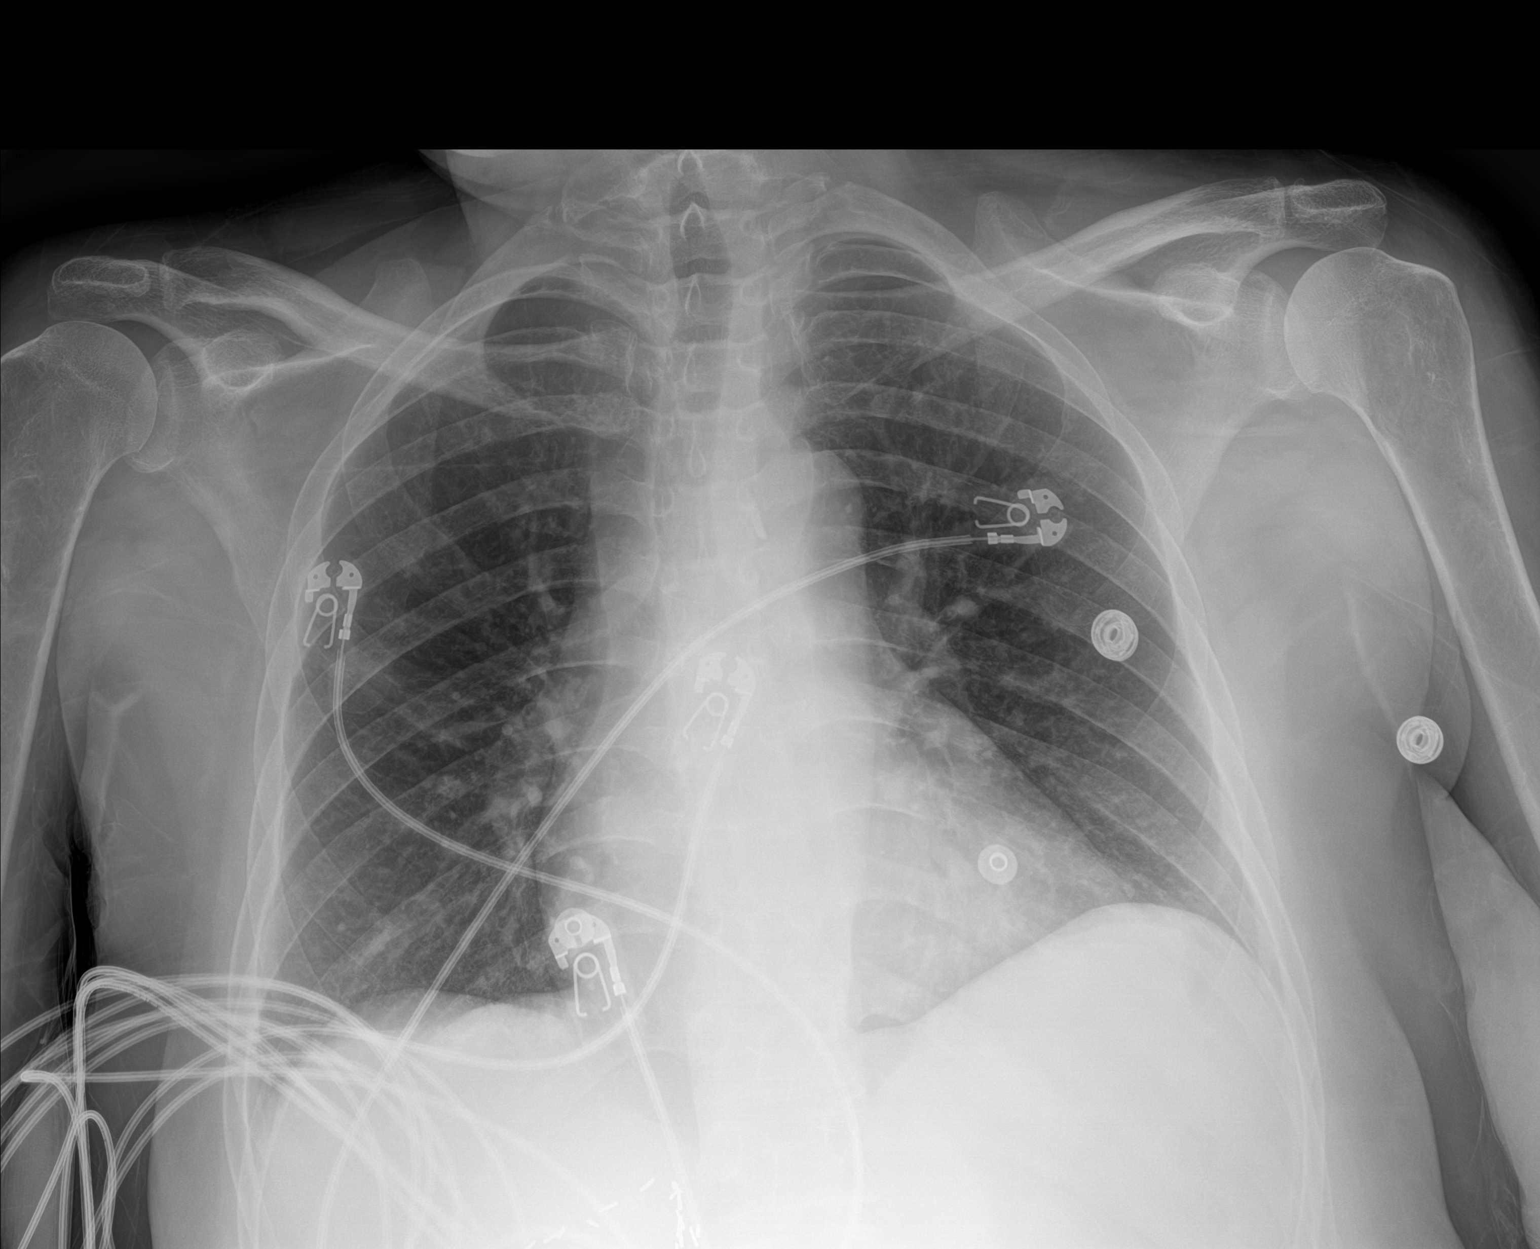

[1 of 1 positions shown; findings below may reference images not displayed]

FINDINGS: Low volumes and atelectatic changes. No consolidation, features of
edema, pneumothorax, or effusion. Few known calcified granulomata
within the lungs are better seen on comparison CT. The aorta is
calcified. The remaining cardiomediastinal contours are
unremarkable. No acute osseous or soft tissue abnormality.
Degenerative changes are present in the imaged spine and shoulders.
Surgical clips are present in the right upper quadrant. Telemetry
leads overlie the chest.
IMPRESSION: Low volumes and atelectatic changes. No other acute cardiopulmonary
process.

Aortic Atherosclerosis ([id number]).

## 2021-05-17 NOTE — Telephone Encounter (Signed)
Pt is calling wanting to know her results to her recent labs and also what the next step will be.  ?

## 2021-05-17 NOTE — Telephone Encounter (Signed)
See result note.  

## 2021-05-18 NOTE — Progress Notes (Signed)
Will call pt to schedule once we receive more dates for his scheduled

## 2021-05-24 ENCOUNTER — Encounter (HOSPITAL_COMMUNITY): Payer: Self-pay

## 2021-05-24 ENCOUNTER — Other Ambulatory Visit: Payer: Self-pay

## 2021-05-24 ENCOUNTER — Emergency Department (HOSPITAL_COMMUNITY)
Admission: EM | Admit: 2021-05-24 | Discharge: 2021-05-24 | Disposition: A | Discharge status: Home / Self Care | Payer: Medicare Other | Attending: Emergency Medicine | Admitting: Emergency Medicine

## 2021-05-24 ENCOUNTER — Emergency Department (HOSPITAL_COMMUNITY): Payer: Medicare Other

## 2021-05-24 DIAGNOSIS — I10 Essential (primary) hypertension: Secondary | ICD-10-CM | POA: Insufficient documentation

## 2021-05-24 DIAGNOSIS — Z7982 Long term (current) use of aspirin: Secondary | ICD-10-CM | POA: Insufficient documentation

## 2021-05-24 DIAGNOSIS — Z79899 Other long term (current) drug therapy: Secondary | ICD-10-CM | POA: Diagnosis not present

## 2021-05-24 DIAGNOSIS — R109 Unspecified abdominal pain: Secondary | ICD-10-CM | POA: Diagnosis present

## 2021-05-24 DIAGNOSIS — R1110 Vomiting, unspecified: Secondary | ICD-10-CM | POA: Diagnosis not present

## 2021-05-24 DIAGNOSIS — Z955 Presence of coronary angioplasty implant and graft: Secondary | ICD-10-CM | POA: Diagnosis not present

## 2021-05-24 DIAGNOSIS — R1084 Generalized abdominal pain: Secondary | ICD-10-CM | POA: Insufficient documentation

## 2021-05-24 LAB — CBC WITH DIFFERENTIAL/PLATELET
Abs Immature Granulocytes: 0.01 10*3/uL (ref 0.00–0.07)
Basophils Absolute: 0 10*3/uL (ref 0.0–0.1)
Basophils Relative: 0 %
Eosinophils Absolute: 0.1 10*3/uL (ref 0.0–0.5)
Eosinophils Relative: 1 %
HCT: 33.3 % — ABNORMAL LOW (ref 36.0–46.0)
Hemoglobin: 11.6 g/dL — ABNORMAL LOW (ref 12.0–15.0)
Immature Granulocytes: 0 %
Lymphocytes Relative: 45 %
Lymphs Abs: 2.1 10*3/uL (ref 0.7–4.0)
MCH: 25.8 pg — ABNORMAL LOW (ref 26.0–34.0)
MCHC: 34.8 g/dL (ref 30.0–36.0)
MCV: 74.2 fL — ABNORMAL LOW (ref 80.0–100.0)
Monocytes Absolute: 0.5 10*3/uL (ref 0.1–1.0)
Monocytes Relative: 11 %
Neutro Abs: 2.1 10*3/uL (ref 1.7–7.7)
Neutrophils Relative %: 43 %
Platelets: 279 10*3/uL (ref 150–400)
RBC: 4.49 MIL/uL (ref 3.87–5.11)
RDW: 20.2 % — ABNORMAL HIGH (ref 11.5–15.5)
WBC: 4.8 10*3/uL (ref 4.0–10.5)
nRBC: 0 % (ref 0.0–0.2)

## 2021-05-24 LAB — COMPREHENSIVE METABOLIC PANEL
ALT: 19 U/L (ref 0–44)
AST: 21 U/L (ref 15–41)
Albumin: 3.7 g/dL (ref 3.5–5.0)
Alkaline Phosphatase: 67 U/L (ref 38–126)
Anion gap: 10 (ref 5–15)
BUN: 11 mg/dL (ref 6–20)
CO2: 26 mmol/L (ref 22–32)
Calcium: 8.6 mg/dL — ABNORMAL LOW (ref 8.9–10.3)
Chloride: 106 mmol/L (ref 98–111)
Creatinine, Ser: 0.58 mg/dL (ref 0.44–1.00)
GFR, Estimated: >60 mL/min (ref >60)
Glucose, Bld: 118 mg/dL — ABNORMAL HIGH (ref 70–99)
Potassium: 3.8 mmol/L (ref 3.5–5.1)
Sodium: 142 mmol/L (ref 135–145)
Total Bilirubin: 0.3 mg/dL (ref 0.3–1.2)
Total Protein: 7.1 g/dL (ref 6.5–8.1)

## 2021-05-24 LAB — URINALYSIS, ROUTINE W REFLEX MICROSCOPIC
Bilirubin Urine: NEGATIVE
Glucose, UA: NEGATIVE mg/dL
Hgb urine dipstick: NEGATIVE
Ketones, ur: NEGATIVE mg/dL
Leukocytes,Ua: NEGATIVE
Nitrite: NEGATIVE
Protein, ur: NEGATIVE mg/dL
Specific Gravity, Urine: 1.015 (ref 1.005–1.030)
pH: 6 (ref 5.0–8.0)

## 2021-05-24 LAB — LIPASE, BLOOD: Lipase: 20 U/L (ref 11–51)

## 2021-05-24 MED ORDER — SODIUM CHLORIDE 0.9 % IV BOLUS
1000.0000 mL | Freq: Once | INTRAVENOUS | Status: AC
  Administered 2021-05-24: 10:00:00 1000 mL via INTRAVENOUS

## 2021-05-24 MED ORDER — PROCHLORPERAZINE EDISYLATE 10 MG/2ML IJ SOLN
10.0000 mg | Freq: Once | INTRAMUSCULAR | Status: AC
  Administered 2021-05-24: 10:00:00 10 mg via INTRAVENOUS
  Filled 2021-05-24: qty 2

## 2021-05-24 NOTE — ED Triage Notes (Signed)
Patient states chronic abdominal pain and nausea, presents today with same complaints. Patient prescribed phenergan and the medication has not helped her nausea. Patient states 10 episodes of vomiting and diarrhea in 24 hours.  ?

## 2021-05-24 NOTE — ED Notes (Signed)
Ginger ale given for fluid challenge 

## 2021-05-24 NOTE — ED Provider Notes (Signed)
[facility] EMERGENCY DEPARTMENT Provider Note   CSN: [id number] Arrival date & time: 05/24/21  7829     History  Chief Complaint  Patient presents with   Abdominal Pain    Angel Price is a 56 y.o. female.   Abdominal Pain Associated symptoms: no chest pain and no shortness of breath   Patient with acute on chronic abdominal pain.  History of hernia and infected mesh multiple abdominal surgeries.  Has a Billroth II abdominal surgery previously.  Sees GI and has recently had some thickening at the anastomosis site.  No relief with Phenergan.  Has had some diarrhea but that is unusual for her.  States decreased oral intake.  States anything she eats will hurt more and will come back up.  She states abdomen is a little more swollen.  Past Medical History:  Diagnosis Date   Abscess    soft tissue   Adrenal mass (HCC)    Alcohol abuse    Anxiety    Blood transfusion without reported diagnosis    Chronic abdominal pain    Chronic wound infection of abdomen    Colon polyp    colonoscopy 04/2014   Depression    Diverticulosis    colonoscopy 04/2014 moderat pan colonic   Drug-seeking behavior    Gastritis    EGD 05/2014   Gastroparesis Nov 2015   GERD (gastroesophageal reflux disease)    Hemorrhoid    internal large   Hiatal hernia    History of Billroth II operation    Hypertension    Lung nodule    CT 02/2014 needs repeat 1 month   Lung nodule < 6cm on CT 04/25/2014   Lupus (HCC)    Malingering    Nausea and vomiting    chronic, recurrent   Pancreatitis    Schatzki's ring    patent per EGD 04/2014   Sickle cell trait (HCC)    Suicide attempt (HCC)    Thyroid disease 2000   overactive, radiation   Past Surgical History:  Procedure Laterality Date   ABDOMINAL HYSTERECTOMY  2013   [place]   ABDOMINAL HYSTERECTOMY     ABDOMINAL SURGERY     ADRENALECTOMY Right    AGILE CAPSULE N/A 01/05/2015   Procedure: AGILE CAPSULE;  Surgeon: Corbin Ade, MD;   [facility]: [facility];  Service: Endoscopy;  Laterality: N/A;  0700   Billroth II procedure      [place], first 2000, 2005/2006.   bilroth 2     BIOPSY  05/20/2013   Procedure: BIOPSIES OF ASCENDING AND SIGMOID COLON;  Surgeon: Corbin Ade, MD;  Location: [facility] OR[facility];  Service: Endoscopy;;   BIOPSY  04/26/2014   Procedure: BIOPSIES;  Surgeon: West Bali, MD;  Location: [facility];  Service: Endoscopy;;   BIOPSY  12/24/2018   Procedure: BIOPSY;  Surgeon: Corbin Ade, MD;  Location: [facility];  Service: Endoscopy;;  colon   CHOLECYSTECTOMY     COLONOSCOPY     in [place]   COLONOSCOPY WITH PROPOFOL N/A 05/20/2013   Dr.Rourk- inadequate prep, normal appearing rectum, grossly normal colon aside from pancolonic diverticula, normal terminal ileum bx= unremarkable colonic mucosa. Due for early interval 2016.    COLONOSCOPY WITH PROPOFOL N/A 04/26/2014   FAO:ZHYQMV ileum/one colon polyp removed/moderate pan-colonic diverticulosis/large internal hemorrhoids   COLONOSCOPY WITH PROPOFOL N/A 12/23/2014   Dr.Rourk- minimal internal hemorrhoids, pancolonic diverticulosis   COLONOSCOPY WITH PROPOFOL N/A 08/23/2016   Procedure: COLONOSCOPY WITH  PROPOFOL;  Surgeon: West Bali, MD;  [facility]: [facility];  Service: Endoscopy;  Laterality: N/A;   COLONOSCOPY WITH PROPOFOL N/A 12/24/2018   Pancolonic diverticulosis, normal TI, one 5 mm polyp in rectum (tubular adenoma). Somewhat friable hemorrhagic mucosa in area of IC valve/cecum, possibly related to scope trauma s/p biopsy.    DEBRIDEMENT OF ABDOMINAL WALL ABSCESS N/A 02/08/2013   Procedure: DEBRIDEMENT OF ABDOMINAL WALL ABSCESS;  Surgeon: Dalia Heading, MD;  Location: [facility];  Service: General;  Laterality: N/A;   ESOPHAGOGASTRODUODENOSCOPY (EGD) WITH PROPOFOL N/A 05/20/2013   Dr.Rourk- s/p prior gastric surgery with normal esophagus, residual gastric mucosa and patent efferent limb   ESOPHAGOGASTRODUODENOSCOPY (EGD) WITH PROPOFOL N/A 02/03/2014   Dr.  Jena Gauss:  s/p hemigastrectomy with retained gastric contents. Residual gastric mucosa and efferent limb appeared normal otherwise. Query gastroparesis.    ESOPHAGOGASTRODUODENOSCOPY (EGD) WITH PROPOFOL N/A 04/26/2014   ZOX:WRUEAVWU'J ring/small HH/mild non-erosive gasrtitis/normal anastomosis   ESOPHAGOGASTRODUODENOSCOPY (EGD) WITH PROPOFOL N/A 12/23/2014   Dr.Rourk- s/p prior hemigastrctomy, active oozing from anastomotic suture site, hemostasis achieved   ESOPHAGOGASTRODUODENOSCOPY (EGD) WITH PROPOFOL N/A 05/23/2016   Dr. 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    ESOPHAGOGASTRODUODENOSCOPY (EGD) WITH PROPOFOL N/A 05/11/2017   Procedure: ESOPHAGOGASTRODUODENOSCOPY (EGD) WITH PROPOFOL;  Surgeon: Corbin Ade, MD;  [facility];  Service: Endoscopy;  Laterality: N/A;   EXCISION OF MESH  08/2019   HEMORRHOID SURGERY N/A 06/18/2017   Procedure: THREE COLUMN EXTENSIVE HEMORRHOIDECTOMY;  Surgeon: Lucretia Roers, MD;  Location: [facility];  Service: General;  Laterality: N/A;   INCISION AND DRAINAGE ABSCESS N/A 01/06/2017   Procedure: INCISION AND DRAINAGE ABDOMINAL WALL ABSCESS;  Surgeon: Franky Macho, MD;  Location: [facility];  Service: General;  Laterality: N/A;   LEFT HEART CATH AND CORONARY ANGIOGRAPHY N/A 05/09/2020   Procedure: LEFT HEART CATH AND CORONARY ANGIOGRAPHY;  Surgeon: Elder Negus, MD;  [facility]: [facility];  Service: Cardiovascular;  Laterality: N/A;   POLYPECTOMY  12/24/2018   Procedure: POLYPECTOMY;  Surgeon: Corbin Ade, MD;  [facility];  Service: Endoscopy;;  colon   tendon repar Right    wrist   WOUND EXPLORATION Right 06/24/2013   Procedure: exploration of traumatic wound right wrist;  Surgeon: Tami Ribas, MD;  Location: [facility] OR;  Service: Orthopedics;  Laterality: Right;     Home Medications Prior to Admission medications   Medication Sig Start  Date End Date Taking? Authorizing Provider  amLODipine (NORVASC) 10 MG tablet Take 10 mg by mouth daily. 05/18/21  Yes [provider]  aspirin EC 81 MG EC tablet Take 1 tablet (81 mg total) by mouth daily. 05/19/19  Yes Elgergawy, Leana Roe, MD  atenolol (TENORMIN) 50 MG tablet Take 50 mg by mouth daily. 05/18/21  Yes [provider]  baclofen (LIORESAL) 10 MG tablet Take 10 mg by mouth 2 (two) times daily. 09/22/20  Yes [provider]  Cholecalciferol (VITAMIN D3) 50 MCG (2000 UT) capsule Take 2,000 Units by mouth daily. 10/26/20  Yes [provider]  Cyanocobalamin (B-12) 500 MCG TABS Take 1 tablet by mouth daily. 10/26/20  Yes [provider]  diltiazem (CARDIZEM CD) 240 MG 24 hr capsule TAKE 1 CAPSULE(240 MG) BY MOUTH DAILY 01/22/21  Yes Patwardhan, Manish J, MD  escitalopram (LEXAPRO) 20 MG tablet Take 20 mg by mouth daily. 04/05/20  Yes [provider]  hydroxychloroquine (PLAQUENIL) 200 MG tablet Take 1.5 tablets (300 mg total) by mouth daily. 03/08/21  Yes Rice, Jamesetta Orleans, MD  ibuprofen (ADVIL) 800 MG tablet Take 800 mg by mouth 2 (two) times daily as needed. 05/21/21  Yes [provider]  isosorbide mononitrate (IMDUR) 60 MG 24 hr tablet Take 0.5 tablets (30 mg total) by mouth daily. 05/31/20 05/24/21 Yes Patwardhan, Manish J, MD  levocetirizine (XYZAL) 5 MG tablet Take 5 mg by mouth daily. 12/28/20  Yes [provider]  lisinopril-hydrochlorothiazide (ZESTORETIC) 20-12.5 MG tablet Take 1 tablet by mouth every morning. 10/31/20  Yes [provider]  mirtazapine (REMERON) 15 MG tablet Take 15 mg by mouth at bedtime. 04/22/20  Yes [provider]  pantoprazole (PROTONIX) 40 MG tablet Take 40 mg by mouth daily. 04/16/21  Yes [provider]  prochlorperazine (COMPAZINE) 10 MG tablet Take 1 tablet (10 mg total) by mouth every 6 (six) hours as needed for nausea or vomiting. 10/15/20  Yes Dione Booze, MD   promethazine (PHENERGAN) 12.5 MG tablet Take 12.5 mg by mouth every 6 (six) hours as needed for nausea or vomiting.  12/18/19  Yes [provider]  rosuvastatin (CRESTOR) 20 MG tablet Take 1 tablet (20 mg total) by mouth daily. Patient taking differently: Take 20 mg by mouth at bedtime. 06/25/19 11/06/28 Yes Patwardhan, Manish J, MD  thiamine 100 MG tablet Take 100 mg by mouth daily. 05/18/21  Yes [provider]  nitroGLYCERIN (NITROSTAT) 0.4 MG SL tablet Place 1 tablet (0.4 mg total) under the tongue every 5 (five) minutes as needed for chest pain. Patient taking differently: Place 0.4 mg under the tongue every 5 (five) minutes x 3 doses as needed for chest pain. 12/29/19 05/15/21  Patwardhan, Anabel Bene, MD  ondansetron (ZOFRAN) 4 MG tablet Take 4 mg by mouth every 6 (six) hours as needed. Patient not taking: Reported on 05/24/2021 04/15/21   [provider]  pantoprazole (PROTONIX) 20 MG tablet Take 1 tablet (20 mg total) by mouth daily. Patient not taking: Reported on 05/24/2021 11/07/19   Couture, Cortni S, PA-C      Allergies    Metoclopramide and Reglan [metoclopramide]    Review of Systems   Review of Systems  Respiratory:  Negative for shortness of breath.   Cardiovascular:  Negative for chest pain.  Gastrointestinal:  Positive for abdominal pain.  Genitourinary:  Negative for flank pain.  Musculoskeletal:  Negative for back pain.  Neurological:  Negative for weakness.   Physical Exam Updated Vital Signs BP (!) 154/104   Pulse 72   Temp 98 F (36.7 C) (Oral)   Resp 18   Ht 5\' 3"  (1.6 m)   Wt 72 kg   LMP  (LMP Unknown)   SpO2 99%   BMI 28.12 kg/m  Physical Exam Vitals and nursing note reviewed.  HENT:     Head: Atraumatic.  Cardiovascular:     Rate and Rhythm: Normal rate.  Abdominal:     Tenderness: There is abdominal tenderness.     Comments: Scars from previous abdominal surgery.  Reducible hernia right lower quadrant and reducible hernia left  upper quadrant.  No distention.  Neurological:     Mental Status: She is alert.    ED Results / Procedures / Treatments   Labs (all labs ordered are listed, but only abnormal results are displayed) Labs Reviewed  COMPREHENSIVE METABOLIC PANEL - Abnormal; Notable for the following components:      Result Value  Glucose, Bld 118 (*)    Calcium 8.6 (*)    All other components within normal limits  CBC WITH DIFFERENTIAL/PLATELET - Abnormal; Notable for the following components:   Hemoglobin 11.6 (*)    HCT 33.3 (*)    MCV 74.2 (*)    MCH 25.8 (*)    RDW 20.2 (*)    All other components within normal limits  LIPASE, BLOOD  URINALYSIS, ROUTINE W REFLEX MICROSCOPIC    EKG None  Radiology DG Abd 2 Views  Result Date: 05/24/2021 CLINICAL DATA:  Vomiting with abdominal pain and diarrhea EXAM: ABDOMEN - 2 VIEW COMPARISON:  Abdominal radiograph dated May 15, 2021 FINDINGS: The bowel gas pattern is normal. There is no evidence of free air. No radio-opaque calculi. Lung bases are clear. Multiple surgical clips projecting over the right upper quadrant. Degenerate disc disease of the lower lumbar spine. IMPRESSION: Nonobstructive bowel gas pattern.  No extraluminal free air. Electronically Signed   By: Larose Hires D.O.   On: 05/24/2021 09:38    Procedures Procedures    Medications Ordered in ED Medications  sodium chloride 0.9 % bolus 1,000 mL (0 mLs Intravenous Stopped 05/24/21 1229)  prochlorperazine (COMPAZINE) injection 10 mg (10 mg Intravenous Given 05/24/21 0941)    ED Course/ Medical Decision Making/ A&P                           Medical Decision Making Amount and/or Complexity of Data Reviewed Labs: ordered. Radiology: ordered.  Risk Prescription drug management.   Patient presents with acute on chronic, abdominal pain.  Initial differential diagnosis is long includes obstruction, infection, free air.  Has had worsening pain of her chronic abdominal pain.  Has had  vomiting.  Has had diarrhea which is not unusual for her.  Pain is crampy.  Similar pain she has had in the past.  X-ray done and independently interpreted.  Shows no obstruction.  Lab work reviewed and reassuring.  Reviewed previous GI notes.  Feels better after antiemetic and fluid treatment.  Doubt acute obstruction at this time.  Doubt severe abscess.  Appears stable for discharge home.  Outpatient follow-up with PCP and gastroenterology.        Final Clinical Impression(s) / ED Diagnoses Final diagnoses:  Vomiting  Generalized abdominal pain    Rx / DC Orders ED Discharge Orders     None         Benjiman Core, MD 05/24/21 1836

## 2021-05-24 NOTE — Discharge Instructions (Signed)
Continue medicines.  Follow-up with your PCP and gastroenterology ?

## 2021-05-25 ENCOUNTER — Telehealth: Payer: Self-pay | Admitting: Internal Medicine

## 2021-05-25 ENCOUNTER — Encounter: Payer: Self-pay | Admitting: *Deleted

## 2021-05-25 NOTE — Telephone Encounter (Signed)
Called pt., she has been scheduled for EGD +/-ED, asa 3 Dr. Gala Romney on 3/23 at 11:45am. Aware will send instructions via mychart. Will also let know when pre-op is scheduled. She voiced understanding.  ? ?PA approved via [place]. Auth# [id number], DOS: Jun 07, 2021 - Sep 05, 2021 ?

## 2021-05-25 NOTE — Telephone Encounter (Signed)
Patient called asking when her procedure may be scheduled  ?

## 2021-06-01 NOTE — Patient Instructions (Signed)
? ? ? ? ? ? ? [name] ? 06/01/2021  ?  ? '@PREFPERIOPPHARMACY'$ @ ? ? Your procedure is scheduled on  06/07/2021. ? ? Report to [facility] at  1000  A.M. ? ? Call this number if you have problems the morning of surgery: ? [phone] ? ? Remember: ? Follow the diet instructions given to you by the office. ?  ? Take these medicines the morning of surgery with A SIP OF WATER  ? ?amlodipine, atenolol, baclofen, diltiazem, lexapro, isosorbide, xyzal, protonix, compazine(if needed). ? ?  ? Do not wear jewelry, make-up or nail polish. ? Do not wear lotions, powders, or perfumes, or deodorant. ? Do not shave 48 hours prior to surgery.  Men may shave face and neck. ? Do not bring valuables to the hospital. ? [facility] is not responsible for any belongings or valuables. ? ?Contacts, dentures or bridgework may not be worn into surgery.  Leave your suitcase in the car.  After surgery it may be brought to your room. ? ?For patients admitted to the hospital, discharge time will be determined by your treatment team. ? ?Patients discharged the day of surgery will not be allowed to drive home and must have someone with them for 24 hours.  ? ? ?Special instructions:   DO NOT smoke tobacco or vape for 24 hours before your procedure. ? ?Please read over the following fact sheets that you were given. ?Anesthesia Post-op Instructions and Care and Recovery After Surgery ?  ? ? ? Upper Endoscopy, Adult, Care After ?This sheet gives you information about how to care for yourself after your procedure. Your health care provider may also give you more specific instructions. If you have problems or questions, contact your health care provider. ?What can I expect after the procedure? ?After the procedure, it is common to have: ?A sore throat. ?Mild stomach pain or discomfort. ?Bloating. ?Nausea. ?Follow these instructions at home: ? ?Follow instructions from your health care provider about what to eat or drink after your  procedure. ?Return to your normal activities as told by your health care provider. Ask your health care provider what activities are safe for you. ?Take over-the-counter and prescription medicines only as told by your health care provider. ?If you were given a sedative during the procedure, it can affect you for several hours. Do not drive or operate machinery until your health care provider says that it is safe. ?Keep all follow-up visits as told by your health care provider. This is important. ?Contact a health care provider if you have: ?A sore throat that lasts longer than one day. ?Trouble swallowing. ?Get help right away if: ?You vomit blood or your vomit looks like coffee grounds. ?You have: ?A fever. ?Bloody, black, or tarry stools. ?A severe sore throat or you cannot swallow. ?Difficulty breathing. ?Severe pain in your chest or abdomen. ?Summary ?After the procedure, it is common to have a sore throat, mild stomach discomfort, bloating, and nausea. ?If you were given a sedative during the procedure, it can affect you for several hours. Do not drive or operate machinery until your health care provider says that it is safe. ?Follow instructions from your health care provider about what to eat or drink after your procedure. ?Return to your normal activities as told by your health care provider. ?This information is not intended to replace advice given to you by your health care provider. Make sure you discuss any questions you have with your health care provider. ?  Document Revised: 01/08/2019 Document Reviewed: 08/04/2017 ?Elsevier Patient Education ? [place]. ?Esophageal Dilatation ?Esophageal dilatation, also called esophageal dilation, is a procedure to widen or open a blocked or narrowed part of the esophagus. The esophagus is the part of the body that moves food and liquid from the mouth to the stomach. You may need this procedure if: ?You have a buildup of scar tissue in your esophagus that  makes it difficult, painful, or impossible to swallow. This can be caused by gastroesophageal reflux disease (GERD). ?You have cancer of the esophagus. ?There is a problem with how food moves through your esophagus. ?In some cases, you may need this procedure repeated at a later time to dilate the esophagus gradually. ?Tell a health care provider about: ?Any allergies you have. ?All medicines you are taking, including vitamins, herbs, eye drops, creams, and over-the-counter medicines. ?Any problems you or family members have had with anesthetic medicines. ?Any blood disorders you have. ?Any surgeries you have had. ?Any medical conditions you have. ?Any antibiotic medicines you are required to take before dental procedures. ?Whether you are pregnant or may be pregnant. ?What are the risks? ?Generally, this is a safe procedure. However, problems may occur, including: ?Bleeding due to a tear in the lining of the esophagus. ?A hole, or perforation, in the esophagus. ?What happens before the procedure? ?Ask your health care provider about: ?Changing or stopping your regular medicines. This is especially important if you are taking diabetes medicines or blood thinners. ?Taking medicines such as aspirin and ibuprofen. These medicines can thin your blood. Do not take these medicines unless your health care provider tells you to take them. ?Taking over-the-counter medicines, vitamins, herbs, and supplements. ?Follow instructions from your health care provider about eating or drinking restrictions. ?Plan to have a responsible adult take you home from the hospital or clinic. ?Plan to have a responsible adult care for you for the time you are told after you leave the hospital or clinic. This is important. ?What happens during the procedure? ?You may be given a medicine to help you relax (sedative). ?A numbing medicine may be sprayed into the back of your throat, or you may gargle the medicine. ?Your health care provider may  perform the dilatation using various surgical instruments, such as: ?Simple dilators. This instrument is carefully placed in the esophagus to stretch it. ?Guided wire bougies. This involves using an endoscope to insert a wire into the esophagus. A dilator is passed over this wire to enlarge the esophagus. Then the wire is removed. ?Balloon dilators. An endoscope with a small balloon is inserted into the esophagus. The balloon is inflated to stretch the esophagus and open it up. ?The procedure may vary among health care providers and hospitals. ?What can I expect after the procedure? ?Your blood pressure, heart rate, breathing rate, and blood oxygen level will be monitored until you leave the hospital or clinic. ?Your throat may feel slightly sore and numb. This will get better over time. ?You will not be allowed to eat or drink until your throat is no longer numb. ?When you are able to drink, urinate, and sit on the edge of the bed without nausea or dizziness, you may be able to return home. ?Follow these instructions at home: ?Take over-the-counter and prescription medicines only as told by your health care provider. ?If you were given a sedative during the procedure, it can affect you for several hours. Do not drive or operate machinery until your health care provider  says that it is safe. ?Plan to have a responsible adult care for you for the time you are told. This is important. ?Follow instructions from your health care provider about any eating or drinking restrictions. ?Do not use any products that contain nicotine or tobacco, such as cigarettes, e-cigarettes, and chewing tobacco. If you need help quitting, ask your health care provider. ?Keep all follow-up visits. This is important. ?Contact a health care provider if: ?You have a fever. ?You have pain that is not relieved by medicine. ?Get help right away if: ?You have chest pain. ?You have trouble breathing. ?You have trouble swallowing. ?You vomit  blood. ?You have black, tarry, or bloody stools. ?These symptoms may represent a serious problem that is an emergency. Do not wait to see if the symptoms will go away. Get medical help right away. Call your local emergency service

## 2021-06-04 ENCOUNTER — Telehealth: Payer: Self-pay

## 2021-06-04 NOTE — Telephone Encounter (Signed)
Patient called stating she has developed another ulcer on her nose which is painful and burning.  Patient states Dr. Benjamine Mola wanted her to notify him when she had another ulcer so he could call in a prescription.  Patient requested a return call.   ?

## 2021-06-05 ENCOUNTER — Other Ambulatory Visit: Payer: Self-pay

## 2021-06-05 ENCOUNTER — Encounter (HOSPITAL_COMMUNITY): Payer: Self-pay

## 2021-06-05 ENCOUNTER — Encounter (HOSPITAL_COMMUNITY)
Admission: RE | Admit: 2021-06-05 | Discharge: 2021-06-05 | Disposition: A | Discharge status: Home / Self Care | Payer: Medicare Other | Source: Ambulatory Visit | Attending: Internal Medicine | Admitting: Internal Medicine

## 2021-06-05 HISTORY — DX: Cerebral infarction, unspecified: I63.9

## 2021-06-07 ENCOUNTER — Ambulatory Visit (HOSPITAL_COMMUNITY)
Admission: RE | Admit: 2021-06-07 | Discharge: 2021-06-07 | Disposition: A | Discharge status: Home / Self Care | Payer: Medicare Other | Source: Ambulatory Visit | Attending: Internal Medicine | Admitting: Internal Medicine

## 2021-06-07 ENCOUNTER — Encounter (HOSPITAL_COMMUNITY)
Admission: RE | Disposition: A | Discharge status: Home / Self Care | Payer: Self-pay | Source: Ambulatory Visit | Attending: Internal Medicine

## 2021-06-07 ENCOUNTER — Ambulatory Visit (HOSPITAL_BASED_OUTPATIENT_CLINIC_OR_DEPARTMENT_OTHER): Payer: Medicare Other | Admitting: Anesthesiology

## 2021-06-07 ENCOUNTER — Ambulatory Visit (HOSPITAL_COMMUNITY): Payer: Medicare Other | Admitting: Anesthesiology

## 2021-06-07 DIAGNOSIS — R1310 Dysphagia, unspecified: Secondary | ICD-10-CM

## 2021-06-07 DIAGNOSIS — K219 Gastro-esophageal reflux disease without esophagitis: Secondary | ICD-10-CM | POA: Insufficient documentation

## 2021-06-07 DIAGNOSIS — Z8711 Personal history of peptic ulcer disease: Secondary | ICD-10-CM | POA: Insufficient documentation

## 2021-06-07 DIAGNOSIS — K449 Diaphragmatic hernia without obstruction or gangrene: Secondary | ICD-10-CM | POA: Diagnosis not present

## 2021-06-07 DIAGNOSIS — F17210 Nicotine dependence, cigarettes, uncomplicated: Secondary | ICD-10-CM | POA: Diagnosis not present

## 2021-06-07 DIAGNOSIS — K3184 Gastroparesis: Secondary | ICD-10-CM | POA: Diagnosis not present

## 2021-06-07 DIAGNOSIS — I25119 Atherosclerotic heart disease of native coronary artery with unspecified angina pectoris: Secondary | ICD-10-CM

## 2021-06-07 DIAGNOSIS — F418 Other specified anxiety disorders: Secondary | ICD-10-CM | POA: Diagnosis not present

## 2021-06-07 DIAGNOSIS — I10 Essential (primary) hypertension: Secondary | ICD-10-CM

## 2021-06-07 DIAGNOSIS — M329 Systemic lupus erythematosus, unspecified: Secondary | ICD-10-CM | POA: Insufficient documentation

## 2021-06-07 DIAGNOSIS — K439 Ventral hernia without obstruction or gangrene: Secondary | ICD-10-CM | POA: Insufficient documentation

## 2021-06-07 DIAGNOSIS — F319 Bipolar disorder, unspecified: Secondary | ICD-10-CM | POA: Diagnosis not present

## 2021-06-07 DIAGNOSIS — Z86010 Personal history of colonic polyps: Secondary | ICD-10-CM | POA: Insufficient documentation

## 2021-06-07 DIAGNOSIS — R1013 Epigastric pain: Secondary | ICD-10-CM | POA: Diagnosis not present

## 2021-06-07 HISTORY — PX: ESOPHAGOGASTRODUODENOSCOPY (EGD) WITH PROPOFOL: SHX5813

## 2021-06-07 SURGERY — ESOPHAGOGASTRODUODENOSCOPY (EGD) WITH PROPOFOL
Anesthesia: General

## 2021-06-07 MED ORDER — PROPOFOL 10 MG/ML IV BOLUS
INTRAVENOUS | Status: DC | PRN
Start: 2021-06-07 — End: 2021-06-07
  Administered 2021-06-07: 100 mg via INTRAVENOUS

## 2021-06-07 MED ORDER — LACTATED RINGERS IV SOLN: INTRAVENOUS | Status: DC

## 2021-06-07 MED ORDER — PROPOFOL 500 MG/50ML IV EMUL
INTRAVENOUS | Status: DC | PRN
  Administered 2021-06-07: 180 ug/kg/min via INTRAVENOUS

## 2021-06-07 NOTE — Discharge Instructions (Signed)
EGD ?Discharge instructions ?Please read the instructions outlined below and refer to this sheet in the next few weeks. These discharge instructions provide you with general information on caring for yourself after you leave the hospital. Your doctor may also give you specific instructions. While your treatment has been planned according to the most current medical practices available, unavoidable complications occasionally occur. If you have any problems or questions after discharge, please call your doctor. ?ACTIVITY ?You may resume your regular activity but move at a slower pace for the next 24 hours.  ?Take frequent rest periods for the next 24 hours.  ?Walking will help expel (get rid of) the air and reduce the bloated feeling in your abdomen.  ?No driving for 24 hours (because of the anesthesia (medicine) used during the test).  ?You may shower.  ?Do not sign any important legal documents or operate any machinery for 24 hours (because of the anesthesia used during the test).  ?NUTRITION ?Drink plenty of fluids.  ?You may resume your normal diet.  ?Begin with a light meal and progress to your normal diet.  ?Avoid alcoholic beverages for 24 hours or as instructed by your caregiver.  ?MEDICATIONS ?You may resume your normal medications unless your caregiver tells you otherwise.  ?WHAT YOU CAN EXPECT TODAY ?You may experience abdominal discomfort such as a feeling of fullness or ?gas? pains.  ?FOLLOW-UP ?Your doctor will discuss the results of your test with you.  ?SEEK IMMEDIATE MEDICAL ATTENTION IF ANY OF THE FOLLOWING OCCUR: ?Excessive nausea (feeling sick to your stomach) and/or vomiting.  ?Severe abdominal pain and distention (swelling).  ?Trouble swallowing.  ?Temperature over 101? F (37.8? C).  ?Rectal bleeding or vomiting of blood.   ? ? ?Your stomach had much retained food debris.  Because of this, we could not complete your examination. ? ?Your esophagus was seen well and it appeared normal.  No dilation  performed ? ? GERD information provided ? ? recommend 10 to 15 pounds of weight loss between now and the end of the calendar year ? ? stop pantoprazole or Protonix; begin Nexium 40 mg pill every day 30 minutes before breakfast ? ? office visit with Neil Crouch in 6 weeks. ? ?  At patient request, I called Camie Patience at [phone]-reviewed findings and recommendations ?

## 2021-06-07 NOTE — Op Note (Signed)
[facility] ?Patient Name: Angel Price ?Procedure Date: 06/07/2021 10:02 AM ?MRN: [id number] ?Date of Birth: 11/20/1965 ?Attending MD: Norvel Richards , MD ?CSN: [id number] ?Age: 56 ?Admit Type: Outpatient ?Procedure:                Upper GI endoscopy ?Indications:              Dyspepsia, Dysphagia ?Providers:                Norvel Richards, MD, Charlsie Quest Theda Sers RN, Therapist, sports,  ?                          Randa Spike, Technician ?Referring MD:              ?Medicines:                Propofol per Anesthesia ?Complications:            No immediate complications. ?Estimated Blood Loss:     Estimated blood loss: none. ?Procedure:                Pre-Anesthesia Assessment: ?                          - Prior to the procedure, a History and Physical  ?                          was performed, and patient medications and  ?                          allergies were reviewed. The patient's tolerance of  ?                          previous anesthesia was also reviewed. The risks  ?                          and benefits of the procedure and the sedation  ?                          options and risks were discussed with the patient.  ?                          All questions were answered, and informed consent  ?                          was obtained. Prior Anticoagulants: The patient has  ?                          taken no previous anticoagulant or antiplatelet  ?                          agents. ASA Grade Assessment: III - A patient with  ?                          severe systemic disease. After reviewing the risks  ?  and benefits, the patient was deemed in  ?                          satisfactory condition to undergo the procedure. ?                          After obtaining informed consent, the endoscope was  ?                          passed under direct vision. Throughout the  ?                          procedure, the patient's blood pressure, pulse, and  ?                          oxygen  saturations were monitored continuously. The  ?                          GIF-H190 ([id number]) scope was introduced through the  ?                          mouth, and advanced to the second part of duodenum.  ?                          The upper GI endoscopy was accomplished without  ?                          difficulty. The patient tolerated the procedure  ?                          well. ?Scope In: 11:07:47 AM ?Scope Out: 11:08:43 AM ?Total Procedure Duration: 0 hours 0 minutes 56 seconds  ?Findings: ?     The examined esophagus was normal. There appeared to be a large amount  ?     of retained gastric contents in the stomach as it was entered. Based on  ?     this finding, the procedure was aborted. No dilation performed. ?Impression:               - Normal esophagus. Retained gastric contents.  ?                          Procedure aborted. Please note last intake of solid  ?                          food reportedly 1730 yesterday. This exam was  ?                          performed 1110 today. ?                          - No specimens collected. ?Moderate Sedation: ?     Moderate (conscious) sedation was personally administered by an  ?     anesthesia professional. The following parameters were monitored: oxygen  ?     saturation, heart rate, blood pressure, respiratory  rate, EKG, adequacy  ?     of pulmonary ventilation, and response to care. ?Recommendation:           - Patient has a contact number available for  ?                          emergencies. The signs and symptoms of potential  ?                          delayed complications were discussed with the  ?                          patient. Return to normal activities tomorrow.  ?                          Written discharge instructions were provided to the  ?                          patient. ?                          - Advance diet as tolerated. Stop pantoprazole;  ?                          begin Nexium 40 mg daily 30 minutes before  ?                           breakfast. New prescription provided. 10 to 15  ?                          pound weight loss between now and the end of the  ?                          year recommended. Office visit with [place] in 6 weeks. ?Procedure Code(s):        --- Professional --- ?                          [phone], Esophagogastroduodenoscopy, flexible,  ?                          transoral; diagnostic, including collection of  ?                          specimen(s) by brushing or washing, when performed  ?                          (separate procedure) ?Diagnosis Code(s):        --- Professional --- ?                          R10.13, Epigastric pain ?                          R13.10, Dysphagia, unspecified ?CPT copyright 2019 American Medical Association. All rights reserved. ?The codes documented in this report are preliminary and upon coder review  may  ?be revised to meet current compliance requirements. ?Cristopher Estimable. Suhaylah Wampole, MD ?Norvel Richards, MD ?06/07/2021 11:49:38 AM ?This report has been signed electronically. ?Number of Addenda: 0 ?

## 2021-06-07 NOTE — Transfer of Care (Signed)
Immediate Anesthesia Transfer of Care Note ? ?Patient: Angel Price ? ?Procedure(s) Performed: ESOPHAGOGASTRODUODENOSCOPY (EGD) WITH PROPOFOL ?MALONEY DILATION ? ?Patient Location: PACU ? ?Anesthesia Type:General ? ?Level of Consciousness: awake, alert  and oriented ? ?Airway & Oxygen Therapy: Patient Spontanous Breathing ? ?Post-op Assessment: Report given to RN and Post -op Vital signs reviewed and stable ? ?Post vital signs: Reviewed ? ?Last Vitals:  ?Vitals Value Taken Time  ?BP 110/77   ?Temp 36.0   ?Pulse 6589   ?Resp 15   ?SpO2 96   ? ? ?Last Pain:  ?Vitals:  ? 06/07/21 1105  ?TempSrc:   ?PainSc: 7   ?   ? ?Patients Stated Pain Goal: 7 (06/07/21 1004) ? ?Complications: No notable events documented. ?

## 2021-06-07 NOTE — Anesthesia Preprocedure Evaluation (Signed)
Anesthesia Evaluation  ?Patient identified by MRN, date of birth, ID band ?Patient awake ? ? ? ?Reviewed: ?Allergy & Precautions, NPO status , Patient's Chart, lab work & pertinent test results, reviewed documented beta blocker date and time  ? ?Airway ?Mallampati: II ? ?TM Distance: >3 FB ?Neck ROM: Full ? ? ? Dental ? ?(+) Dental Advisory Given, Chipped, Poor Dentition, Missing ?  ?Pulmonary ?shortness of breath and with exertion, Current Smoker and Patient abstained from smoking.,  ?  ?Pulmonary exam normal ?breath sounds clear to auscultation ? ? ? ? ? ? Cardiovascular ?hypertension, Pt. on medications and Pt. on home beta blockers ?+ angina + CAD  ?Normal cardiovascular exam ?Rhythm:Regular Rate:Normal ? ? ?  ?Neuro/Psych ? Headaches, PSYCHIATRIC DISORDERS Anxiety Depression Bipolar Disorder CVA   ? GI/Hepatic ?hiatal hernia, GERD (Gastroparesis)  Medicated and Controlled,(+)  ?  ? substance abuse ? alcohol use,   ?Endo/Other  ?Hypothyroidism  ? Renal/GU ?negative Renal ROS  ? ?  ?Musculoskeletal ?negative musculoskeletal ROS ?(+)  ? Abdominal ?  ?Peds ? Hematology ? ?(+) Blood dyscrasia, anemia ,   ?Anesthesia Other Findings ?Lupus  ? Reproductive/Obstetrics ?negative OB ROS ? ?  ? ? ? ? ? ? ? ? ? ? ? ? ? ?  ?  ? ? ? ? ? ? ? ?Anesthesia Physical ?Anesthesia Plan ? ?ASA: 3 ? ?Anesthesia Plan: General  ? ?Post-op Pain Management: Minimal or no pain anticipated  ? ?Induction: Intravenous ? ?PONV Risk Score and Plan: TIVA ? ?Airway Management Planned: Nasal Cannula and Natural Airway ? ?Additional Equipment:  ? ?Intra-op Plan:  ? ?Post-operative Plan:  ? ?Informed Consent: I have reviewed the patients History and Physical, chart, labs and discussed the procedure including the risks, benefits and alternatives for the proposed anesthesia with the patient or authorized representative who has indicated his/her understanding and acceptance.  ? ? ? ? ? ?Plan Discussed with: CRNA and  Surgeon ? ?Anesthesia Plan Comments:   ? ? ? ? ? ? ?Anesthesia Quick Evaluation ? ?

## 2021-06-07 NOTE — Interval H&P Note (Signed)
History and Physical Interval Note: ? ?06/07/2021 ?10:54 AM ? ?Angel Price  has presented today for surgery, with the diagnosis of nausea, vomiting, abd pain, diarrhea.  The various methods of treatment have been discussed with the patient and family. After consideration of risks, benefits and other options for treatment, the patient has consented to  Procedure(s) with comments: ?ESOPHAGOGASTRODUODENOSCOPY (EGD) WITH PROPOFOL (N/A) - 11:45am ?MALONEY DILATION (N/A) as a surgical intervention.  The patient's history has been reviewed, patient examined, no change in status, stable for surgery.  I have reviewed the patient's chart and labs.  Questions were answered to the patient's satisfaction.   ? ? ?Jasmain Ahlberg ? ?   no change.  Serial plain films demonstrated no bowel obstruction or significant stool burden.  Patient endorses 20 to 30 pound weight gain over the past couple of years.  Pantoprazole is not working.  Does have esophageal dysphagia. ? ?I have offered the patient an EGD with  esophageal dilation as feasible/appropriate today per plan. ?The risks, benefits, limitations, alternatives and imponderables have been reviewed with the patient. Potential for esophageal dilation, biopsy, etc. have also been reviewed.  Questions have been answered. All parties agreeable.  ?

## 2021-06-07 NOTE — Anesthesia Postprocedure Evaluation (Signed)
Anesthesia Post Note ? ?Patient: Angel Price ? ?Procedure(s) Performed: ESOPHAGOGASTRODUODENOSCOPY (EGD) WITH PROPOFOL ? ?Patient location during evaluation: Phase II ?Anesthesia Type: General ?Level of consciousness: awake and alert and oriented ?Pain management: pain level controlled ?Vital Signs Assessment: post-procedure vital signs reviewed and stable ?Respiratory status: spontaneous breathing, nonlabored ventilation and respiratory function stable ?Cardiovascular status: blood pressure returned to baseline and stable ?Postop Assessment: no apparent nausea or vomiting ?Anesthetic complications: no ? ? ?No notable events documented. ? ? ?Last Vitals:  ?Vitals:  ? 06/07/21 1004 06/07/21 1119  ?BP: 127/90 110/77  ?Pulse: 72 83  ?Resp: 15   ?Temp: 37 ?C 37 ?C  ?SpO2:  94%  ?  ?Last Pain:  ?Vitals:  ? 06/07/21 1119  ?TempSrc: Oral  ?PainSc: 7   ? ? ?  ?  ?  ?  ?  ?  ? ?[name] C Elizbeth Posa ? ? ? ? ?

## 2021-06-12 ENCOUNTER — Encounter (HOSPITAL_COMMUNITY): Payer: Self-pay | Admitting: Internal Medicine

## 2021-06-22 ENCOUNTER — Other Ambulatory Visit: Payer: Self-pay | Admitting: Cardiology

## 2021-06-22 DIAGNOSIS — I25110 Atherosclerotic heart disease of native coronary artery with unstable angina pectoris: Secondary | ICD-10-CM

## 2021-06-22 DIAGNOSIS — I208 Other forms of angina pectoris: Secondary | ICD-10-CM

## 2021-07-11 ENCOUNTER — Other Ambulatory Visit: Payer: Self-pay | Admitting: Internal Medicine

## 2021-07-11 DIAGNOSIS — M329 Systemic lupus erythematosus, unspecified: Secondary | ICD-10-CM

## 2021-07-13 ENCOUNTER — Emergency Department (HOSPITAL_COMMUNITY): Payer: Medicare Other

## 2021-07-13 ENCOUNTER — Emergency Department (HOSPITAL_COMMUNITY)
Admission: EM | Admit: 2021-07-13 | Discharge: 2021-07-13 | Disposition: A | Discharge status: Home / Self Care | Payer: Medicare Other | Attending: Emergency Medicine | Admitting: Emergency Medicine

## 2021-07-13 ENCOUNTER — Other Ambulatory Visit: Payer: Self-pay

## 2021-07-13 ENCOUNTER — Encounter (HOSPITAL_COMMUNITY): Payer: Self-pay | Admitting: *Deleted

## 2021-07-13 DIAGNOSIS — R519 Headache, unspecified: Secondary | ICD-10-CM | POA: Diagnosis not present

## 2021-07-13 DIAGNOSIS — F17200 Nicotine dependence, unspecified, uncomplicated: Secondary | ICD-10-CM | POA: Insufficient documentation

## 2021-07-13 DIAGNOSIS — R531 Weakness: Secondary | ICD-10-CM | POA: Insufficient documentation

## 2021-07-13 DIAGNOSIS — E876 Hypokalemia: Secondary | ICD-10-CM | POA: Insufficient documentation

## 2021-07-13 DIAGNOSIS — Z79899 Other long term (current) drug therapy: Secondary | ICD-10-CM | POA: Insufficient documentation

## 2021-07-13 DIAGNOSIS — Z7982 Long term (current) use of aspirin: Secondary | ICD-10-CM | POA: Diagnosis not present

## 2021-07-13 DIAGNOSIS — R112 Nausea with vomiting, unspecified: Secondary | ICD-10-CM | POA: Diagnosis not present

## 2021-07-13 DIAGNOSIS — I10 Essential (primary) hypertension: Secondary | ICD-10-CM | POA: Insufficient documentation

## 2021-07-13 DIAGNOSIS — H53149 Visual discomfort, unspecified: Secondary | ICD-10-CM

## 2021-07-13 DIAGNOSIS — Z20822 Contact with and (suspected) exposure to covid-19: Secondary | ICD-10-CM | POA: Diagnosis not present

## 2021-07-13 LAB — COMPREHENSIVE METABOLIC PANEL
ALT: 17 U/L (ref 0–44)
AST: 17 U/L (ref 15–41)
Albumin: 4 g/dL (ref 3.5–5.0)
Alkaline Phosphatase: 67 U/L (ref 38–126)
Anion gap: 7 (ref 5–15)
BUN: 8 mg/dL (ref 6–20)
CO2: 23 mmol/L (ref 22–32)
Calcium: 9.1 mg/dL (ref 8.9–10.3)
Chloride: 110 mmol/L (ref 98–111)
Creatinine, Ser: 0.53 mg/dL (ref 0.44–1.00)
GFR, Estimated: >60 mL/min (ref >60)
Glucose, Bld: 97 mg/dL (ref 70–99)
Potassium: 3.4 mmol/L — ABNORMAL LOW (ref 3.5–5.1)
Sodium: 140 mmol/L (ref 135–145)
Total Bilirubin: 0.5 mg/dL (ref 0.3–1.2)
Total Protein: 7.5 g/dL (ref 6.5–8.1)

## 2021-07-13 LAB — DIFFERENTIAL
Abs Immature Granulocytes: 0.02 10*3/uL (ref 0.00–0.07)
Basophils Absolute: 0 10*3/uL (ref 0.0–0.1)
Basophils Relative: 0 %
Eosinophils Absolute: 0.1 10*3/uL (ref 0.0–0.5)
Eosinophils Relative: 1 %
Immature Granulocytes: 0 %
Lymphocytes Relative: 22 %
Lymphs Abs: 1.8 10*3/uL (ref 0.7–4.0)
Monocytes Absolute: 0.7 10*3/uL (ref 0.1–1.0)
Monocytes Relative: 8 %
Neutro Abs: 5.4 10*3/uL (ref 1.7–7.7)
Neutrophils Relative %: 69 %

## 2021-07-13 LAB — PROTIME-INR
INR: 1.1 (ref 0.8–1.2)
Prothrombin Time: 14 seconds (ref 11.4–15.2)

## 2021-07-13 LAB — PROTEIN, CSF: Total  Protein, CSF: 24 mg/dL (ref 15–45)

## 2021-07-13 LAB — CBC
HCT: 31.7 % — ABNORMAL LOW (ref 36.0–46.0)
Hemoglobin: 10.6 g/dL — ABNORMAL LOW (ref 12.0–15.0)
MCH: 24.8 pg — ABNORMAL LOW (ref 26.0–34.0)
MCHC: 33.4 g/dL (ref 30.0–36.0)
MCV: 74.1 fL — ABNORMAL LOW (ref 80.0–100.0)
Platelets: 336 10*3/uL (ref 150–400)
RBC: 4.28 MIL/uL (ref 3.87–5.11)
RDW: 19.8 % — ABNORMAL HIGH (ref 11.5–15.5)
WBC: 7.9 10*3/uL (ref 4.0–10.5)
nRBC: 0 % (ref 0.0–0.2)

## 2021-07-13 LAB — CSF CELL COUNT WITH DIFFERENTIAL
RBC Count, CSF: 1 /mm3 — ABNORMAL HIGH
RBC Count, CSF: 2 /mm3 — ABNORMAL HIGH
Tube #: 1
Tube #: 4
WBC, CSF: 1 /mm3 (ref 0–5)
WBC, CSF: 2 /mm3 (ref 0–5)

## 2021-07-13 LAB — RESP PANEL BY RT-PCR (FLU A&B, COVID) ARPGX2
Influenza A by PCR: NEGATIVE
Influenza B by PCR: NEGATIVE
SARS Coronavirus 2 by RT PCR: NEGATIVE

## 2021-07-13 LAB — CBG MONITORING, ED: Glucose-Capillary: 118 mg/dL — ABNORMAL HIGH (ref 70–99)

## 2021-07-13 LAB — APTT: aPTT: 28 seconds (ref 24–36)

## 2021-07-13 LAB — GLUCOSE, CSF: Glucose, CSF: 59 mg/dL (ref 40–70)

## 2021-07-13 LAB — ETHANOL: Alcohol, Ethyl (B): <10 mg/dL (ref <10)

## 2021-07-13 MED ORDER — KETOROLAC TROMETHAMINE 15 MG/ML IJ SOLN
15.0000 mg | Freq: Once | INTRAMUSCULAR | Status: AC
  Administered 2021-07-13: 15 mg via INTRAVENOUS
  Filled 2021-07-13: qty 1

## 2021-07-13 MED ORDER — LORAZEPAM 2 MG/ML IJ SOLN
1.0000 mg | Freq: Once | INTRAMUSCULAR | Status: AC
  Administered 2021-07-13: 1 mg via INTRAVENOUS
  Filled 2021-07-13: qty 1

## 2021-07-13 MED ORDER — FENTANYL CITRATE PF 50 MCG/ML IJ SOSY
25.0000 ug | PREFILLED_SYRINGE | Freq: Once | INTRAMUSCULAR | Status: AC
  Administered 2021-07-13: 25 ug via INTRAVENOUS
  Filled 2021-07-13: qty 1

## 2021-07-13 MED ORDER — LIDOCAINE-EPINEPHRINE (PF) 2 %-1:200000 IJ SOLN
INTRAMUSCULAR | Status: AC
  Filled 2021-07-13: qty 20

## 2021-07-13 MED ORDER — FENTANYL CITRATE PF 50 MCG/ML IJ SOSY
100.0000 ug | PREFILLED_SYRINGE | Freq: Once | INTRAMUSCULAR | Status: AC
  Administered 2021-07-13: 100 ug via INTRAVENOUS
  Filled 2021-07-13: qty 2

## 2021-07-13 MED ORDER — PROCHLORPERAZINE EDISYLATE 10 MG/2ML IJ SOLN
10.0000 mg | Freq: Once | INTRAMUSCULAR | Status: AC
  Administered 2021-07-13: 10 mg via INTRAVENOUS
  Filled 2021-07-13: qty 2

## 2021-07-13 MED ORDER — DIPHENHYDRAMINE HCL 50 MG/ML IJ SOLN
25.0000 mg | Freq: Once | INTRAMUSCULAR | Status: AC
  Administered 2021-07-13: 25 mg via INTRAVENOUS
  Filled 2021-07-13: qty 1

## 2021-07-13 MED ORDER — GADOBUTROL 1 MMOL/ML IV SOLN
7.0000 mL | Freq: Once | INTRAVENOUS | Status: AC | PRN
  Administered 2021-07-13: 7 mL via INTRAVENOUS

## 2021-07-13 MED ORDER — FENTANYL CITRATE PF 50 MCG/ML IJ SOSY
25.0000 ug | PREFILLED_SYRINGE | Freq: Once | INTRAMUSCULAR | Status: DC
Start: 2021-07-13 — End: 2021-07-14
  Filled 2021-07-13: qty 1

## 2021-07-13 NOTE — ED Notes (Addendum)
Pt c/o severe headache. Awaiting tele-neurologist response.  ?

## 2021-07-13 NOTE — ED Notes (Signed)
Pt taken to CT with this RN at bedside.  ?

## 2021-07-13 NOTE — ED Provider Notes (Signed)
?[facility] ?Provider Note ? ? ?CSN: [id number] ?Arrival date & time: 07/13/21  1448 ? ?An emergency department physician performed an initial assessment on this suspected stroke patient at 85. ? ?History ?Chief Complaint  ?Patient presents with  ? Extremity Weakness  ? ? ?Angel Price is a 56 y.o. female with history of stroke no residual deficits and not on any anticoagulation, hypertension who presents to the emergency department with left-sided weakness that started at 12 PM today.  Patient states that she been having progressively worsening headache over the last 2 days with the weakness starting around 12 PM today.  This is been constant since onset.  Patient also reports associated nausea and vomiting has been intermittent over the last couple days as well.  Patient denies any history of complex migraine, anticoagulant use, chest pain, shortness of breath.  She rates her headache severe in severity. ? 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  She had MRIs done at that time which were negative. ? ? ?Extremity Weakness ? ? ?  ? ?Home Medications ?Prior to Admission medications   ?Medication Sig Start Date End Date Taking? Authorizing Provider  ?amLODipine (NORVASC) 10 MG tablet Take 10 mg by mouth daily. 05/18/21  Yes [provider]  ?aspirin EC 81 MG EC tablet Take 1 tablet (81 mg total) by mouth daily. 05/19/19  Yes Elgergawy, Silver Huguenin, MD  ?atenolol (TENORMIN) 50 MG tablet Take 50 mg by mouth daily. 05/18/21  Yes [provider]  ?baclofen (LIORESAL) 10 MG tablet Take 10 mg by mouth 2 (two) times daily. 09/22/20  Yes [provider]  ?esomeprazole (NEXIUM) 40 MG capsule Take 40 mg by mouth every morning. 06/08/21  Yes [provider]  ?hydroxychloroquine (PLAQUENIL) 200 MG tablet Take 1.5 tablets (300 mg total) by mouth daily. 03/08/21  Yes Rice, Resa Miner, MD   ?ibuprofen (ADVIL) 800 MG tablet Take 800 mg by mouth 2 (two) times daily as needed. 05/21/21  Yes [provider]  ?isosorbide mononitrate (IMDUR) 60 MG 24 hr tablet TAKE 1/2 TABLET BY MOUTH DAILY 06/25/21  Yes Patwardhan, Manish J, MD  ?lisinopril-hydrochlorothiazide (ZESTORETIC) 20-12.5 MG tablet Take 1 tablet by mouth every morning. 10/31/20  Yes [provider]  ?nitroGLYCERIN (NITROSTAT) 0.4 MG SL tablet Place 1 tablet (0.4 mg total) under the tongue every 5 (five) minutes as needed for chest pain. ?Patient taking differently: Place 0.4 mg under the tongue every 5 (five) minutes x 3 doses as needed for chest pain. 12/29/19 07/13/21 Yes Patwardhan, Manish J, MD  ?promethazine (PHENERGAN) 12.5 MG tablet Take 12.5 mg by mouth every 6 (six) hours as needed for nausea or vomiting.  12/18/19  Yes [provider]  ?rosuvastatin (CRESTOR) 20 MG tablet Take 1 tablet (20 mg total) by mouth daily. ?Patient taking differently: Take 20 mg by mouth at bedtime. 06/25/19 11/06/28 Yes Patwardhan, Manish J, MD  ?thiamine 100 MG tablet Take 100 mg by mouth daily. 05/18/21  Yes [provider]  ?diltiazem (CARDIZEM CD) 240 MG 24 hr capsule TAKE 1 CAPSULE(240 MG) BY MOUTH DAILY ?Patient not taking: Reported on 07/13/2021 01/22/21   Nigel Mormon, MD  ?prochlorperazine (COMPAZINE) 10 MG tablet Take 1 tablet (10 mg total) by mouth every 6 (six) hours as needed for nausea or vomiting. ?Patient not taking: Reported on 7/82/9562 04/16/84   Delora Fuel, MD  ?   ? ?Allergies    ?  Metoclopramide and Reglan [metoclopramide]   ? ?Review of Systems   ?Review of Systems  ?Musculoskeletal:  Positive for extremity weakness.  ? ?Physical Exam ?Updated Vital Signs ?BP (!) 133/97   Pulse 73   Temp 98.3 ?F (36.8 ?C)   Resp (!) 21   Ht '5\' 3"'$  (1.6 m)   Wt 70.8 kg   LMP  (LMP Unknown)   SpO2 96%   BMI 27.63 kg/m?  ?Physical Exam ?Vitals and nursing note reviewed.  ?Constitutional:   ?   General: She is not in  acute distress. ?   Appearance: Normal appearance.  ?HENT:  ?   Head: Normocephalic and atraumatic.  ?Eyes:  ?   General: Visual field deficit present.     ?   Right eye: No discharge.     ?   Left eye: No discharge.  ?Cardiovascular:  ?   Comments: Regular rate and rhythm.  S1/S2 are distinct without any evidence of murmur, rubs, or gallops.  Radial pulses are 2+ bilaterally.  Dorsalis pedis pulses are 2+ bilaterally.  No evidence of pedal edema. ?Pulmonary:  ?   Comments: Clear to auscultation bilaterally.  Normal effort.  No respiratory distress.  No evidence of wheezes, rales, or rhonchi heard throughout. ?Abdominal:  ?   General: Abdomen is flat. Bowel sounds are normal. There is no distension.  ?   Tenderness: There is no abdominal tenderness. There is no guarding or rebound.  ?Musculoskeletal:     ?   General: Normal range of motion.  ?   Cervical back: Neck supple.  ?Skin: ?   General: Skin is warm and dry.  ?   Findings: No rash.  ?Neurological:  ?   Mental Status: She is alert.  ?   Cranial Nerves: Cranial nerve deficit and facial asymmetry present.  ?   Sensory: Sensory deficit present.  ?   Motor: Weakness and pronator drift present.  ?   Comments: Patient had difficulty with extraocular movements.  Difficult to say whether this is from pain or true muscular weakness versus neglect.  Clear sensory deficits on the left with evidence of neglect on the left.  ?Psychiatric:     ?   Mood and Affect: Mood normal.     ?   Behavior: Behavior normal.  ? ? ?ED Results / Procedures / Treatments   ?Labs ?(all labs ordered are listed, but only abnormal results are displayed) ?Labs Reviewed  ?CBC - Abnormal; Notable for the following components:  ?    Result Value  ? Hemoglobin 10.6 (*)   ? HCT 31.7 (*)   ? MCV 74.1 (*)   ? MCH 24.8 (*)   ? RDW 19.8 (*)   ? All other components within normal limits  ?COMPREHENSIVE METABOLIC PANEL - Abnormal; Notable for the following components:  ? Potassium 3.4 (*)   ? All other  components within normal limits  ?CSF CELL COUNT WITH DIFFERENTIAL - Abnormal; Notable for the following components:  ? Appearance, CSF CLEAR (*)   ? RBC Count, CSF 2 (*)   ? All other components within normal limits  ?CSF CELL COUNT WITH DIFFERENTIAL - Abnormal; Notable for the following components:  ? Color, CSF CLEAR (*)   ? Appearance, CSF COLORLESS (*)   ? RBC Count, CSF 1 (*)   ? All other components within normal limits  ?CBG MONITORING, ED - Abnormal; Notable for the following components:  ? Glucose-Capillary 118 (*)   ? All other  components within normal limits  ?RESP PANEL BY RT-PCR (FLU A&B, COVID) ARPGX2  ?CSF CULTURE W GRAM STAIN  ?ETHANOL  ?PROTIME-INR  ?APTT  ?DIFFERENTIAL  ?GLUCOSE, CSF  ?PROTEIN, CSF  ?RAPID URINE DRUG SCREEN, HOSP PERFORMED  ?URINALYSIS, ROUTINE W REFLEX MICROSCOPIC  ?I-STAT CHEM 8, ED  ?POC URINE PREG, ED  ? ? ?EKG ?EKG Interpretation ? ?Date/Time:  Friday July 13 2021 15:25:05 EDT ?Ventricular Rate:  70 ?PR Interval:  143 ?QRS Duration: 83 ?QT Interval:  441 ?QTC Calculation: 476 ?R Axis:   41 ?Text Interpretation: Sinus rhythm Confirmed by Noemi Chapel [id number]) on 07/13/2021 3:28:18 PM ? ?Radiology ?MR ANGIO HEAD WO CONTRAST ? ?Result Date: 07/13/2021 ?CLINICAL DATA:  Neuro deficit, acute, stroke suspected left sided weakness EXAM: MRI HEAD WITHOUT AND WITH CONTRAST MRA HEAD WITHOUT CONTRAST TECHNIQUE: Multiplanar, multi-echo pulse sequences of the brain and surrounding structures were acquired without and with intravenous contrast. Angiographic images of the Circle of Willis were acquired using MRA technique without intravenous contrast. CONTRAST:  51m GADAVIST GADOBUTROL 1 MMOL/ML IV SOLN COMPARISON:  MRI 2021 FINDINGS: MRI HEAD FINDINGS Brain: No acute infarction or hemorrhage. No intracranial mass, mass effect, edema, hydrocephalus, or extra-axial collection. Ventricles and sulci are normal in size and configuration. Vascular: Major vessel flow voids at the skull base are  preserved. Skull and upper cervical spine: Marrow signal is within normal limits. Sinuses/Orbits: Minor mucosal thickening.  Orbits are unremarkable. Other: Partially empty sella.  Mastoid air cells are clear. MRA HEAD

## 2021-07-13 NOTE — Consult Note (Signed)
Code stroke activated at 1509-pt in CT.  ?Returned from [facility] at 1518. ?Dr Quinn Axe on camera for neuro exam at 1529. ?

## 2021-07-13 NOTE — Procedures (Signed)
Preprocedure Dx: Severe headache for 3 days ?Postprocedure Dx: Severe headache for 3 days ?Procedure:  Fluoroscopically guided lumbar puncture ?Radiologist:  Thornton Papas ?Anesthesia:  3 ml of 1% lidocaine ?Specimen:  9 ml CSF, clear colorless ?EBL:   < 1 ml ?Opening pressure: 7 cm H2O (but measured prone) ?Complications: None  ?

## 2021-07-13 NOTE — Discharge Instructions (Signed)
Your work-up today was negative for stroke or bleeding in your head which is great news.  I would like for you to follow-up with your primary care doctor for further evaluation.  Please return to the emergency department for any worsening symptoms you might have. ?

## 2021-07-13 NOTE — ED Notes (Signed)
Pt returned back to ED room. Pt began vomiting after CT was completed. ?

## 2021-07-13 NOTE — ED Triage Notes (Signed)
Pt with left sided weakness since 1200 today.  + HA and N/V since last night.   ?

## 2021-07-13 NOTE — ED Notes (Signed)
CBG 118 

## 2021-07-13 NOTE — Progress Notes (Signed)
[place] ?[place] ? ?

## 2021-07-13 NOTE — ED Notes (Signed)
Pt remains in radiology having LP under fluoro done.  ?

## 2021-07-13 NOTE — ED Notes (Signed)
Patient transported to MRI 

## 2021-07-13 NOTE — Consult Note (Signed)
NEUROLOGY TELECONSULTATION NOTE  ? ?Date of service: July 13, 2021 ?Patient Name: Angel Price ?MRN:  [id number] ?DOB:  [birth year] ?Reason for consult: severe headache, L sided weakness ? ?Requesting Provider: Dr. Noemi Chapel ?Consult Participants: myself, patient, bedside RN, telestroke RN ?Location of the provider: [facility] ?Location of the patient: [facility] ? ?This consult was provided via telemedicine with 2-way video and audio communication. The patient/family was informed that care would be provided in this way and agreed to receive care in this manner.  ? ?_ _ _   _ __   _ __ _ _  __ __   _ __   __ _ ? ?History of Present Illness  ? ? ?56 yo woman with hx EtOH abuse and drug seeking behavior, multiple presentations with unilateral weakness (L or R) with negative MRI and dx with conversion disorder (2018 and 4 presentations in 2021) who presents with severe headache x3 days and L sided weakness starting at 1200 today. Patient reports no hx migraine. Current headache is worst headache of her life, 10/10 pain, assoc with nausea, vomiting, photophobia. Mild L sided weakness on exam. CT head NAICP (personal review). TNK not administered 2/2 signs c/f SAH. ?  ?ROS  ? ?Per HPI; all other systems reviewed and are negative ? ?Past History  ? ?The following was personally reviewed: ? ?Past Medical History:  ?Diagnosis Date  ? Abscess   ? soft tissue  ? Adrenal mass ([place])   ? Alcohol abuse   ? Anxiety   ? Blood transfusion without reported diagnosis   ? Chronic abdominal pain   ? Chronic wound infection of abdomen   ? Colon polyp   ? colonoscopy 04/2014  ? Depression   ? Diverticulosis   ? colonoscopy 04/2014 moderat pan colonic  ? Drug-seeking behavior   ? Gastritis   ? EGD 05/2014  ? Gastroparesis 01/2014  ? GERD (gastroesophageal reflux disease)   ? Hemorrhoid   ? internal large  ? Hiatal hernia   ? History of Billroth II operation   ? Hypertension   ? Lung nodule   ? CT 02/2014 needs repeat 1 month  ? Lung nodule < 6cm on  CT 04/25/2014  ? Lupus ([place])   ? Malingering   ? Nausea and vomiting   ? chronic, recurrent  ? Pancreatitis   ? Schatzki's ring   ? patent per EGD 04/2014  ? Sickle cell trait ([place])   ? Stroke [facility])   ? Suicide attempt [facility])   ? Thyroid disease 2000  ? overactive, radiation  ? ?Past Surgical History:  ?Procedure Laterality Date  ? ABDOMINAL HYSTERECTOMY  2013  ? [place]  ? ABDOMINAL HYSTERECTOMY    ? ABDOMINAL SURGERY    ? ADRENALECTOMY Right   ? AGILE CAPSULE N/A 01/05/2015  ? Procedure: AGILE CAPSULE;  Surgeon: Daneil Dolin, MD;  [facility];  Service: Endoscopy;  Laterality: N/A;  0700  ? Billroth II procedure     ? [place], first 2000, 2005/2006.  ? bilroth 2    ? BIOPSY  05/20/2013  ? Procedure: BIOPSIES OF ASCENDING AND SIGMOID COLON;  Surgeon: Daneil Dolin, MD;  Location: [facility];  Service: Endoscopy;;  ? BIOPSY  04/26/2014  ? Procedure: BIOPSIES;  Surgeon: Danie Binder, MD;  [facility]: [facility];  Service: Endoscopy;;  ? BIOPSY  12/24/2018  ? Procedure: BIOPSY;  Surgeon: Daneil Dolin, MD;  [facility];  Service: Endoscopy;;  colon  ?  CHOLECYSTECTOMY    ? COLONOSCOPY    ? in [place]  ? COLONOSCOPY WITH PROPOFOL N/A 05/20/2013  ? Dr.Rourk- inadequate prep, normal appearing rectum, grossly normal colon aside from pancolonic diverticula, normal terminal ileum bx= unremarkable colonic mucosa. Due for early interval 2016.   ? COLONOSCOPY WITH PROPOFOL N/A 04/26/2014  ? WUX:LKGMWN ileum/one colon polyp removed/moderate pan-colonic diverticulosis/large internal hemorrhoids  ? COLONOSCOPY WITH PROPOFOL N/A 12/23/2014  ? Dr.Rourk- minimal internal hemorrhoids, pancolonic diverticulosis  ? COLONOSCOPY WITH PROPOFOL N/A 08/23/2016  ? Procedure: COLONOSCOPY WITH PROPOFOL;  Surgeon: Danie Binder, MD;  [facility];  Service: Endoscopy;  Laterality: N/A;  ? COLONOSCOPY WITH PROPOFOL N/A 12/24/2018  ? Pancolonic diverticulosis, normal TI, one 5 mm polyp in rectum (tubular adenoma). Somewhat  friable hemorrhagic mucosa in area of IC valve/cecum, possibly related to scope trauma s/p biopsy.   ? DEBRIDEMENT OF ABDOMINAL WALL ABSCESS N/A 02/08/2013  ? Procedure: DEBRIDEMENT OF ABDOMINAL WALL ABSCESS;  Surgeon: Jamesetta So, MD;  Location: [facility];  Service: General;  Laterality: N/A;  ? ESOPHAGOGASTRODUODENOSCOPY (EGD) WITH PROPOFOL N/A 05/20/2013  ? Dr.Rourk- s/p prior gastric surgery with normal esophagus, residual gastric mucosa and patent efferent limb  ? ESOPHAGOGASTRODUODENOSCOPY (EGD) WITH PROPOFOL N/A 02/03/2014  ? Dr. Gala Romney:  s/p hemigastrectomy with retained gastric contents. Residual gastric mucosa and efferent limb appeared normal otherwise. Query gastroparesis.   ? ESOPHAGOGASTRODUODENOSCOPY (EGD) WITH PROPOFOL N/A 04/26/2014  ? UUV:[id number]'H ring/small HH/mild non-erosive gasrtitis/normal anastomosis  ? ESOPHAGOGASTRODUODENOSCOPY (EGD) WITH PROPOFOL N/A 12/23/2014  ? Dr.Rourk- s/p prior hemigastrctomy, active oozing from anastomotic suture site, hemostasis achieved  ? ESOPHAGOGASTRODUODENOSCOPY (EGD) WITH PROPOFOL N/A 05/23/2016  ? Dr. 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 ESOPHAGOGASTRODUODENOSCOPY (EGD) WITH PROPOFOL N/A 05/11/2017  ? Procedure: ESOPHAGOGASTRODUODENOSCOPY (EGD) WITH PROPOFOL;  Surgeon: Daneil Dolin, MD;  [facility];  Service: Endoscopy;  Laterality: N/A;  ? ESOPHAGOGASTRODUODENOSCOPY (EGD) WITH PROPOFOL N/A 06/07/2021  ? Procedure: ESOPHAGOGASTRODUODENOSCOPY (EGD) WITH PROPOFOL;  Surgeon: Daneil Dolin, MD;  [facility];  Service: Endoscopy;  Laterality: N/A;  11:45am  ? EXCISION OF MESH  08/2019  ? HEMORRHOID SURGERY N/A 06/18/2017  ? Procedure: THREE COLUMN EXTENSIVE HEMORRHOIDECTOMY;  Surgeon: Virl Cagey, MD;  Location: [facility];  Service: General;  Laterality: N/A;  ? INCISION AND DRAINAGE ABSCESS N/A 01/06/2017  ? Procedure: INCISION AND DRAINAGE  ABDOMINAL WALL ABSCESS;  Surgeon: Aviva Signs, MD;  [facility];  Service: General;  Laterality: N/A;  ? LEFT HEART CATH AND CORONARY ANGIOGRAPHY N/A 05/09/2020  ? Procedure: LEFT HEART CATH AND CORONARY ANGIOGRAPHY;  Surgeon: Nigel Mormon, MD;  Location: [facility];  Service: Cardiovascular;  Laterality: N/A;  ? POLYPECTOMY  12/24/2018  ? Procedure: POLYPECTOMY;  Surgeon: Daneil Dolin, MD;  [facility];  Service: Endoscopy;;  colon  ? tendon repar Right   ? wrist  ? WOUND EXPLORATION Right 06/24/2013  ? Procedure: exploration of traumatic wound right wrist;  Surgeon: Tennis Must, MD;  Location: [facility];  Service: Orthopedics;  Laterality: Right;  ? ?Family History  ?Problem Relation Age of Onset  ? Drug abuse Mother   ? Lung cancer Father   ? Cancer Father   ?     mets  ? Drug abuse Sister   ? Drug abuse Brother   ? Breast cancer Maternal Aunt   ? Bipolar  disorder Maternal Aunt   ? Drug abuse Maternal Aunt   ? Colon cancer Maternal Grandmother   ?     late 29s, early 28s  ? Bipolar disorder Paternal Grandfather   ? Brain cancer Son   ? Schizophrenia Son   ? Cancer Son   ?     brain  ? Bipolar disorder Cousin   ? Liver disease Neg Hx   ? ?Social History  ? ?Socioeconomic History  ? Marital status: Divorced  ?  Spouse name: Not on file  ? Number of children: 4  ? Years of education: Not on file  ? Highest education level: Not on file  ?Occupational History  ? Not on file  ?Tobacco Use  ? Smoking status: Every Day  ?  Packs/day: 0.50  ?  Years: 35.00  ?  Pack years: 17.50  ?  Types: Cigarettes  ? Smokeless tobacco: Never  ?Vaping Use  ? Vaping Use: Never used  ?Substance and Sexual Activity  ? Alcohol use: Not Currently  ?  Comment: Occasionally, 1-2 beers on occasion.  ? Drug use: Not Currently  ?  Types: Cocaine  ?  Comment: no cocaine in 3 years per patient (05/15/21)  ? Sexual activity: Not Currently  ?  Birth control/protection: Surgical  ?Other Topics Concern  ? Not on file   ?Social History Narrative  ? ** Merged History Encounter **  ?    ? ?Social Determinants of Health  ? ?Financial Resource Strain: Not on file  ?Food Insecurity: Not on file  ?Transportation Needs: Not on fi

## 2021-07-15 ENCOUNTER — Emergency Department (HOSPITAL_COMMUNITY)
Admission: EM | Admit: 2021-07-15 | Discharge: 2021-07-15 | Disposition: A | Discharge status: Home / Self Care | Payer: Medicare Other | Attending: Emergency Medicine | Admitting: Emergency Medicine

## 2021-07-15 ENCOUNTER — Emergency Department (HOSPITAL_COMMUNITY): Payer: Medicare Other

## 2021-07-15 ENCOUNTER — Encounter (HOSPITAL_COMMUNITY): Payer: Self-pay | Admitting: *Deleted

## 2021-07-15 ENCOUNTER — Other Ambulatory Visit: Payer: Self-pay

## 2021-07-15 DIAGNOSIS — R112 Nausea with vomiting, unspecified: Secondary | ICD-10-CM | POA: Insufficient documentation

## 2021-07-15 DIAGNOSIS — Z79899 Other long term (current) drug therapy: Secondary | ICD-10-CM | POA: Diagnosis not present

## 2021-07-15 DIAGNOSIS — I10 Essential (primary) hypertension: Secondary | ICD-10-CM | POA: Diagnosis not present

## 2021-07-15 DIAGNOSIS — R519 Headache, unspecified: Secondary | ICD-10-CM | POA: Diagnosis not present

## 2021-07-15 DIAGNOSIS — Z7982 Long term (current) use of aspirin: Secondary | ICD-10-CM | POA: Diagnosis not present

## 2021-07-15 DIAGNOSIS — R197 Diarrhea, unspecified: Secondary | ICD-10-CM | POA: Diagnosis not present

## 2021-07-15 MED ORDER — KETOROLAC TROMETHAMINE 30 MG/ML IJ SOLN
30.0000 mg | Freq: Once | INTRAMUSCULAR | Status: AC
  Administered 2021-07-15: 30 mg via INTRAVENOUS
  Filled 2021-07-15: qty 1

## 2021-07-15 MED ORDER — OXYCODONE-ACETAMINOPHEN 5-325 MG PO TABS
2.0000 | ORAL_TABLET | Freq: Once | ORAL | Status: AC
  Administered 2021-07-15: 2 via ORAL
  Filled 2021-07-15: qty 2

## 2021-07-15 MED ORDER — VALPROATE SODIUM 100 MG/ML IV SOLN
500.0000 mg | Freq: Once | INTRAVENOUS | Status: AC
  Administered 2021-07-15: 500 mg via INTRAVENOUS
  Filled 2021-07-15: qty 5

## 2021-07-15 MED ORDER — SODIUM CHLORIDE 0.9 % IV BOLUS
1000.0000 mL | Freq: Once | INTRAVENOUS | Status: AC
Start: 2021-07-15 — End: 2021-07-15
  Administered 2021-07-15: 1000 mL via INTRAVENOUS

## 2021-07-15 MED ORDER — VALPROATE SODIUM 500 MG/5ML IV SOLN
INTRAVENOUS | Status: AC
  Filled 2021-07-15: qty 5

## 2021-07-15 MED ORDER — NAPROXEN 500 MG PO TABS: 500.0000 mg | ORAL_TABLET | Freq: Two times a day (BID) | ORAL | 0 refills | Status: DC

## 2021-07-15 MED ORDER — IOHEXOL 350 MG/ML SOLN
100.0000 mL | Freq: Once | INTRAVENOUS | Status: AC | PRN
  Administered 2021-07-15: 75 mL via INTRAVENOUS

## 2021-07-15 MED ORDER — DEXAMETHASONE SODIUM PHOSPHATE 10 MG/ML IJ SOLN
10.0000 mg | Freq: Once | INTRAMUSCULAR | Status: AC
Start: 2021-07-15 — End: 2021-07-15
  Administered 2021-07-15: 10 mg via INTRAVENOUS
  Filled 2021-07-15: qty 1

## 2021-07-15 MED ORDER — SUMATRIPTAN SUCCINATE 50 MG PO TABS
50.0000 mg | ORAL_TABLET | ORAL | 0 refills | Status: DC | PRN
Start: 2021-07-15 — End: 2021-08-20

## 2021-07-15 NOTE — Discharge Instructions (Addendum)
Headache: ° °You are having a headache. No specific cause was found today for your headache. It may have been a migraine or other cause of headache. Stress, anxiety, fatigue, and depression are common triggers for headaches. Your headache today does not appear to be life-threatening or require hospitalization, but often the exact cause of headaches is not determined in the emergency department. Therefore, followup with your doctor is very important to find out what may have caused your headache, and whether or not you need any further diagnostic testing or treatment. Sometimes headaches can appear benign but then more serious symptoms can develop which should prompt an immediate reevaluation by your doctor or the emergency department. ° °Seek immediate medical attention if: ° °· You develop possible problems with medications prescribed. °· The medications don't resolve your headache, if it recurs, or if you have multiple episodes of vomiting or can't take fluids by mouth °· You have a change from the usual headache. °· If you developed a sudden severe headache or confusion, become poorly responsive or faint, developed a fever above 100.4 or problems breathing, have a change in speech, vision, swallowing or understanding, or developed new weakness, numbness, tingling, incoordination or have a seizure. ° °If you don't have a family doctor to follow up with, see the follow up list below - call this morning for a follow-up appointment in the next 1-2 days. ° °

## 2021-07-15 NOTE — ED Triage Notes (Signed)
Pt with severe HA, pt seen here for same the other day and discharged. Pt states HA got better but worse today.  ?

## 2021-07-15 NOTE — ED Provider Notes (Signed)
?[facility] ?Provider Note ? ? ?CSN: [id number] ?Arrival date & time: 07/15/21  1700 ? ?  ? ?History ? ?Chief Complaint  ?Patient presents with  ? Headache  ? ? ?Angel Price is a 56 y.o. female. ? ?HPI ? ?Patient is a 56 year old female presenting with a severe headache which has been going on for about 5 days.  The patient was actually seen yesterday during the emergency department visit during which time she was worked up extensively, including an MR angiogram of the head, MRI of the brain with and without contrast, CT scans, she had no signs of stroke, no signs of aneurysms.  She had a normal lab work-up in the CSF sample from a lumbar puncture that showed no signs of subarachnoid hemorrhage. ? ?When she left the hospital she was doing well, she states that today the headache came back and is just as bad as it was yesterday, it is located all over her head and she has associated nausea and vomiting with it she did have a little loose stool as well.  No fevers, no chills, no stiff neck, no numbness or weakness of the arms or the legs. ? ?Labs reviewed from yesterday showed no acute finding ? ?Home Medications ?Prior to Admission medications   ?Medication Sig Start Date End Date Taking? Authorizing Provider  ?naproxen (NAPROSYN) 500 MG tablet Take 1 tablet (500 mg total) by mouth 2 (two) times daily with a meal. 07/15/21  Yes Noemi Chapel, MD  ?SUMAtriptan (IMITREX) 50 MG tablet Take 1 tablet (50 mg total) by mouth every 2 (two) hours as needed for migraine. May repeat in 2 hours if headache persists or recurs. 07/15/21  Yes Noemi Chapel, MD  ?amLODipine (NORVASC) 10 MG tablet Take 10 mg by mouth daily. 05/18/21   [provider]  ?aspirin EC 81 MG EC tablet Take 1 tablet (81 mg total) by mouth daily. 05/19/19   Elgergawy, Silver Huguenin, MD  ?atenolol (TENORMIN) 50 MG tablet Take 50 mg by mouth daily. 05/18/21   [provider]  ?baclofen (LIORESAL) 10 MG tablet Take 10 mg by mouth 2  (two) times daily. 09/22/20   [provider]  ?diltiazem (CARDIZEM CD) 240 MG 24 hr capsule TAKE 1 CAPSULE(240 MG) BY MOUTH DAILY ?Patient not taking: Reported on 07/13/2021 01/22/21   Nigel Mormon, MD  ?esomeprazole (NEXIUM) 40 MG capsule Take 40 mg by mouth every morning. 06/08/21   [provider]  ?hydroxychloroquine (PLAQUENIL) 200 MG tablet Take 1.5 tablets (300 mg total) by mouth daily. 03/08/21   Collier Salina, MD  ?isosorbide mononitrate (IMDUR) 60 MG 24 hr tablet TAKE 1/2 TABLET BY MOUTH DAILY 06/25/21   Patwardhan, Reynold Bowen, MD  ?lisinopril-hydrochlorothiazide (ZESTORETIC) 20-12.5 MG tablet Take 1 tablet by mouth every morning. 10/31/20   [provider]  ?nitroGLYCERIN (NITROSTAT) 0.4 MG SL tablet Place 1 tablet (0.4 mg total) under the tongue every 5 (five) minutes as needed for chest pain. ?Patient taking differently: Place 0.4 mg under the tongue every 5 (five) minutes x 3 doses as needed for chest pain. 12/29/19 07/13/21  Patwardhan, Reynold Bowen, MD  ?promethazine (PHENERGAN) 12.5 MG tablet Take 12.5 mg by mouth every 6 (six) hours as needed for nausea or vomiting.  12/18/19   [provider]  ?rosuvastatin (CRESTOR) 20 MG tablet Take 1 tablet (20 mg total) by mouth daily. ?Patient taking differently: Take 20 mg by mouth at bedtime. 06/25/19 11/06/28  Patwardhan, Reynold Bowen,  MD  ?thiamine 100 MG tablet Take 100 mg by mouth daily. 05/18/21   [provider]  ?   ? ?Allergies    ?Metoclopramide and Reglan [metoclopramide]   ? ?Review of Systems   ?Review of Systems  ?Constitutional:  Negative for chills and fever.  ?HENT:  Negative for sore throat.   ?Eyes:  Negative for visual disturbance.  ?Respiratory:  Negative for cough and shortness of breath.   ?Cardiovascular:  Negative for chest pain.  ?Gastrointestinal:  Positive for diarrhea and nausea. Negative for abdominal pain and vomiting.  ?Genitourinary:  Negative for dysuria and frequency.  ?Musculoskeletal:   Negative for back pain and neck pain.  ?Skin:  Negative for rash.  ?Neurological:  Positive for headaches. Negative for weakness and numbness.  ?Hematological:  Negative for adenopathy.  ?Psychiatric/Behavioral:  Negative for behavioral problems.   ? ?Physical Exam ?Updated Vital Signs ?BP (!) 173/99 (BP Location: Right Arm)   Pulse 62   Temp 97.8 ?F (36.6 ?C) (Oral)   Resp 16   Ht 1.6 m ('5\' 3"'$ )   Wt 70.8 kg   LMP  (LMP Unknown)   SpO2 99%   BMI 27.63 kg/m?  ?Physical Exam ?Vitals and nursing note reviewed.  ?Constitutional:   ?   Appearance: She is well-developed.  ?   Comments: Uncomfortable appearing  ?HENT:  ?   Head: Normocephalic and atraumatic.  ?   Mouth/Throat:  ?   Pharynx: No oropharyngeal exudate.  ?Eyes:  ?   General: No scleral icterus.    ?   Right eye: No discharge.     ?   Left eye: No discharge.  ?   Conjunctiva/sclera: Conjunctivae normal.  ?   Pupils: Pupils are equal, round, and reactive to light.  ?Neck:  ?   Thyroid: No thyromegaly.  ?   Vascular: No JVD.  ?Cardiovascular:  ?   Rate and Rhythm: Normal rate and regular rhythm.  ?   Heart sounds: Normal heart sounds. No murmur heard. ?  No friction rub. No gallop.  ?Pulmonary:  ?   Effort: Pulmonary effort is normal. No respiratory distress.  ?   Breath sounds: Normal breath sounds. No wheezing or rales.  ?Abdominal:  ?   General: Bowel sounds are normal. There is no distension.  ?   Palpations: Abdomen is soft. There is no mass.  ?   Tenderness: There is no abdominal tenderness.  ?Musculoskeletal:     ?   General: No tenderness. Normal range of motion.  ?   Cervical back: Normal range of motion and neck supple.  ?Lymphadenopathy:  ?   Cervical: No cervical adenopathy.  ?Skin: ?   General: Skin is warm and dry.  ?   Findings: No erythema or rash.  ?Neurological:  ?   Mental Status: She is alert.  ?   Coordination: Coordination normal.  ?   Comments: Speech is clear, cranial nerves III through XII are intact, memory is intact, strength is  normal in all 4 extremities including grips, sensation is intact to light touch and pinprick in all 4 extremities. Coordination as tested by finger-nose-finger is normal, no limb ataxia. Normal gait, normal reflexes at the patellar tendons bilaterally  ?Psychiatric:     ?   Behavior: Behavior normal.  ? ? ?ED Results / Procedures / Treatments   ?Labs ?(all labs ordered are listed, but only abnormal results are displayed) ?Labs Reviewed - No data to display ? ?EKG ?None ? ?Radiology ?  CT VENOGRAM HEAD ? ?Result Date: 07/15/2021 ?CLINICAL DATA:  Severe headache, hypertension, dural venous sinus thrombosis suspected EXAM: CT VENOGRAM HEAD TECHNIQUE: Venographic phase images of the brain were obtained following the administration of intravenous contrast. Multiplanar reformats and maximum intensity projections were generated. RADIATION DOSE REDUCTION: This exam was performed according to the departmental dose-optimization program which includes automated exposure control, adjustment of the mA and/or kV according to patient size and/or use of iterative reconstruction technique. CONTRAST:  79m OMNIPAQUE IOHEXOL 350 MG/ML SOLN COMPARISON:  07/13/2021 CT head, correlation is also made with 07/13/2021 MRI MRA. FINDINGS: Brain: No evidence of acute infarction, hemorrhage, cerebral edema, mass, mass effect, or midline shift. No hydrocephalus or extra-axial fluid collection. Vascular: No hyperdense vessel. Skull: Normal. Negative for fracture or focal lesion. Sinuses/Orbits: Limited imaging is unremarkable. Other: The mastoid air cells are well aerated. Superior sagittal sinus: Normal. Straight sinus: Normal. Inferior sagittal sinus, vein of Galen and internal cerebral veins: Normal. Transverse sinuses: Normal. Sigmoid sinuses: Normal. Visualized jugular veins: Normal IMPRESSION: 1.  No acute intracranial process. 2. No evidence of dural venous sinus thrombosis or stenosis. Electronically Signed   By: AMerilyn BabaM.D.   On:  07/15/2021 20:22   ? ?Procedures ?Procedures  ? ? ?Medications Ordered in ED ?Medications  ?dexamethasone (DECADRON) injection 10 mg (10 mg Intravenous Given 07/15/21 1843)  ?valproate (DEPACON) 500 mg in dextr

## 2021-07-17 LAB — CSF CULTURE W GRAM STAIN
Culture: NO GROWTH
Gram Stain: NONE SEEN

## 2021-08-15 ENCOUNTER — Encounter: Payer: Self-pay | Admitting: *Deleted

## 2021-08-20 ENCOUNTER — Telehealth: Payer: Self-pay | Admitting: *Deleted

## 2021-08-20 ENCOUNTER — Ambulatory Visit (INDEPENDENT_AMBULATORY_CARE_PROVIDER_SITE_OTHER): Payer: Medicare Other | Admitting: Psychiatry

## 2021-08-20 ENCOUNTER — Other Ambulatory Visit: Payer: Self-pay | Admitting: Psychiatry

## 2021-08-20 ENCOUNTER — Encounter: Payer: Self-pay | Admitting: Psychiatry

## 2021-08-20 VITALS — BP 148/104 | HR 68 | Ht 63.0 in | Wt 155.6 lb

## 2021-08-20 DIAGNOSIS — G43101 Migraine with aura, not intractable, with status migrainosus: Secondary | ICD-10-CM | POA: Diagnosis not present

## 2021-08-20 MED ORDER — NURTEC 75 MG PO TBDP
75.0000 mg | ORAL_TABLET | ORAL | 6 refills | Status: DC | PRN
Start: 2021-08-20 — End: 2021-09-12

## 2021-08-20 MED ORDER — UBRELVY 100 MG PO TABS
100.0000 mg | ORAL_TABLET | ORAL | 6 refills | Status: DC | PRN
Start: 2021-08-20 — End: 2021-08-20

## 2021-08-20 MED ORDER — METHOCARBAMOL 500 MG PO TABS
500.0000 mg | ORAL_TABLET | Freq: Three times a day (TID) | ORAL | 0 refills | Status: AC
Start: 2021-08-20 — End: 2021-08-25

## 2021-08-20 NOTE — Telephone Encounter (Signed)
Roselyn Meier is denied because it is not on your plan's Drug List (formulary). Medication authorization requires the following: (1) You need to try this covered drug: Nurtec ODT*; OR (2) your doctor needs to give [place] specific medical reasons why the covered drug is not appropriate for you.

## 2021-08-20 NOTE — Telephone Encounter (Signed)
I sent in an rx for Nurtec to her pharmacy, thanks

## 2021-08-20 NOTE — Telephone Encounter (Signed)
Nurtec PA, Key: [id number]. Your information has been sent to [facility].

## 2021-08-20 NOTE — Patient Instructions (Signed)
Start [place] as needed for migraines. Take one pill at the first sign of migraine. May repeat once in 2 hours.  Please avoid taking sumatriptan Take Robaxin (methocarbamol) three times a day for the next five days. Side effects may include drowsiness.

## 2021-08-20 NOTE — Progress Notes (Signed)
Referring:  Carrolyn Meiers, MD [address] [place],  [place] [address]  PCP: Carrolyn Meiers, MD  Neurology was asked to evaluate Angel Price, a 56 year old female for a chief complaint of headaches.  Our recommendations of care will be communicated by shared medical record.    CC:  headaches  History provided from self  HPI:  Medical co-morbidities: SLE, depression, smoking, CAD  The patient presents for evaluation of headaches which began at the end of April 2023. She first developed tension in her neck, then the pain progressively built up until she felt her head was going to explode. Headache was associated with photophobia, phonophobia, and nausea (though she notes she does have nausea and vomiting at baseline). Per ED note she was found to have left sided weakness and numbness. She went to the ED 07/13/21 where MRI, MRA, CTV, and LP were negative. She re-presented to the ED 07/15/21 where she was given a migraine cocktail and prescribed sumatriptan for rescue. This helped reduce her headache but did not fully resolve it. She has a persistent mild lingering headache but nothing as bad as the initial headache. Has pain which shoots up the back of her head when she looks down. Will also get sudden sharp pains in her neck followed by neck muscle spasms. Takes baclofen as needed but does not find it helpful.  The patient denies prior history of headaches, though per documentation she has previously had head imaging done for headaches associated with weakness. States she previously had a stroke, though there does not appear to be any evidence of stroke on current or prior MRIs.  Headache History: Onset: April 2023 Triggers: none Aura:  left sided weakness Location: bilateral occipital Quality/Description: squeezing Associated Symptoms:  Photophobia: yes  Phonophobia: yes  Nausea: she is nauseated at baseline Worse with activity?: yes Duration of headaches:  5 days   Headache days per month: 5 Headache free days per month: 25  Current Treatment: Abortive Imitrex 50 mg PRN  Preventative none  Prior Therapies                                 Atenolol Cymbalta 30 mg BID Naproxen Imitrex 50 mg PRN Baclofen 10 mg PRN   LABS: CBC    Component Value Date/Time   WBC 7.9 07/13/2021 1544   RBC 4.28 07/13/2021 1544   HGB 10.6 (L) 07/13/2021 1544   HGB 12.1 05/15/2021 1524   HCT 31.7 (L) 07/13/2021 1544   HCT 37.4 05/15/2021 1524   PLT 336 07/13/2021 1544   PLT 353 05/15/2021 1524   MCV 74.1 (L) 07/13/2021 1544   MCV 76 (L) 05/15/2021 1524   MCH 24.8 (L) 07/13/2021 1544   MCHC 33.4 07/13/2021 1544   RDW 19.8 (H) 07/13/2021 1544   RDW 20.2 (H) 05/15/2021 1524   LYMPHSABS 1.8 07/13/2021 1544   LYMPHSABS 3.2 (H) 05/15/2021 1524   MONOABS 0.7 07/13/2021 1544   EOSABS 0.1 07/13/2021 1544   EOSABS 0.1 05/15/2021 1524   BASOSABS 0.0 07/13/2021 1544   BASOSABS 0.0 05/15/2021 1524      Latest Ref Rng & Units 07/13/2021    3:44 PM 05/24/2021    9:47 AM 05/15/2021    3:24 PM  CMP  Glucose 70 - 99 mg/dL 97   118   94    BUN 6 - 20 mg/dL 8   11  7    Creatinine 0.44 - 1.00 mg/dL 0.53   0.58   0.56    Sodium 135 - 145 mmol/L 140   142   144    Potassium 3.5 - 5.1 mmol/L 3.4   3.8   4.3    Chloride 98 - 111 mmol/L 110   106   107    CO2 22 - 32 mmol/L '23   26   20    '$ Calcium 8.9 - 10.3 mg/dL 9.1   8.6   9.4    Total Protein 6.5 - 8.1 g/dL 7.5   7.1   7.5    Total Bilirubin 0.3 - 1.2 mg/dL 0.5   0.3   0.5    Alkaline Phos 38 - 126 U/L 67   67   85    AST 15 - 41 U/L '17   21   16    '$ ALT 0 - 44 U/L '17   19   12       '$ IMAGING:  MRI/MRA brain 07/13/21: unremarkable CTV head 07/15/21: unremarkable  MRI C-spine 2018: 1. At C6-7 there is a shallow left paracentral disc protrusion. 2. At C5-6 there is a mild broad-based disc bulge. Bilateral uncovertebral degenerative changes. Mild bilateral foraminal stenosis.  Imaging independently  reviewed on August 20, 2021   Current Outpatient Medications on File Prior to Visit  Medication Sig Dispense Refill   amLODipine (NORVASC) 10 MG tablet Take 10 mg by mouth daily.     aspirin EC 81 MG EC tablet Take 1 tablet (81 mg total) by mouth daily. 30 tablet 0   atenolol (TENORMIN) 50 MG tablet Take 50 mg by mouth daily.     baclofen (LIORESAL) 10 MG tablet Take 10 mg by mouth 2 (two) times daily.     diltiazem (CARDIZEM CD) 240 MG 24 hr capsule TAKE 1 CAPSULE(240 MG) BY MOUTH DAILY 30 capsule 3   DULoxetine (CYMBALTA) 30 MG capsule Take 30 mg by mouth 2 (two) times daily.     esomeprazole (NEXIUM) 40 MG capsule Take 40 mg by mouth every morning.     hydroxychloroquine (PLAQUENIL) 200 MG tablet Take 1.5 tablets (300 mg total) by mouth daily. 135 tablet 1   ibuprofen (ADVIL) 800 MG tablet Take 800 mg by mouth 2 (two) times daily as needed.     isosorbide mononitrate (IMDUR) 60 MG 24 hr tablet TAKE 1/2 TABLET BY MOUTH DAILY 90 tablet 3   lisinopril-hydrochlorothiazide (ZESTORETIC) 20-12.5 MG tablet Take 1 tablet by mouth every morning.     mirtazapine (REMERON) 15 MG tablet Take 15 mg by mouth at bedtime as needed.     naproxen (NAPROSYN) 500 MG tablet Take 1 tablet (500 mg total) by mouth 2 (two) times daily with a meal. 30 tablet 0   promethazine (PHENERGAN) 12.5 MG tablet Take 12.5 mg by mouth every 6 (six) hours as needed for nausea or vomiting.      rosuvastatin (CRESTOR) 20 MG tablet Take 1 tablet (20 mg total) by mouth daily. (Patient taking differently: Take 20 mg by mouth at bedtime.) 90 tablet 3   thiamine 100 MG tablet Take 100 mg by mouth daily.     tobramycin-dexamethasone (TOBRADEX) ophthalmic solution SMARTSIG:In Eye(s)     nitroGLYCERIN (NITROSTAT) 0.4 MG SL tablet Place 1 tablet (0.4 mg total) under the tongue every 5 (five) minutes as needed for chest pain. (Patient taking differently: Place 0.4 mg under the tongue every 5 (five) minutes x 3  doses as needed for chest pain.) 30  tablet 3   No current facility-administered medications on file prior to visit.     Allergies: Allergies  Allergen Reactions   Metoclopramide Other (See Comments) and Anaphylaxis    EPS symptoms  Jerking movements   Reglan [Metoclopramide] Other (See Comments)    EPS symptoms     Family History: Migraine or other headaches in the family:  brother Aneurysms in a first degree relative:  none Brain tumors in the family:  son had brain cancer Other neurological illness in the family:   mother had a stroke  Past Medical History: Past Medical History:  Diagnosis Date   Abscess    soft tissue   Adrenal mass ([place])    Alcohol abuse    Anemia    Anxiety    Blood transfusion without reported diagnosis    Chronic abdominal pain    Chronic wound infection of abdomen    Colon polyp    colonoscopy 04/2014   Depression    Discoid lupus erythematosus    Diverticulosis    colonoscopy 04/2014 moderat pan colonic   Drug-seeking behavior    Gastritis    EGD 05/2014   Gastroparesis 01/2014   GERD (gastroesophageal reflux disease)    Hemorrhoid    internal large   Hiatal hernia    History of Billroth II operation    Hypertension    Lung nodule    CT 02/2014 needs repeat 1 month   Lung nodule < 6cm on CT 04/25/2014   Lupus ([place])    Malingering    Migraine    Nausea and vomiting    chronic, recurrent   Pancreatitis    Schatzki's ring    patent per EGD 04/2014   Sickle cell trait ([place])    Stroke ([place])    Suicide attempt ([place])    Thyroid disease 2000   overactive, radiation    Past Surgical History Past Surgical History:  Procedure Laterality Date   ABDOMINAL HYSTERECTOMY  2013   [place]   ABDOMINAL HYSTERECTOMY     ABDOMINAL SURGERY     ADRENALECTOMY Right    AGILE CAPSULE N/A 01/05/2015   Procedure: AGILE CAPSULE;  Surgeon: Daneil Dolin, MD;  [facility];  Service: Endoscopy;  Laterality: N/A;  0700   Billroth II procedure      [place], first 2000,  2005/2006.   bilroth 2     BIOPSY  05/20/2013   Procedure: BIOPSIES OF ASCENDING AND SIGMOID COLON;  Surgeon: Daneil Dolin, MD;  Location: [facility];  Service: Endoscopy;;   BIOPSY  04/26/2014   Procedure: BIOPSIES;  Surgeon: Danie Binder, MD;  Location: [facility];  Service: Endoscopy;;   BIOPSY  12/24/2018   Procedure: BIOPSY;  Surgeon: Daneil Dolin, MD;  [facility];  Service: Endoscopy;;  colon   CHOLECYSTECTOMY     COLONOSCOPY     in [place]   COLONOSCOPY WITH PROPOFOL N/A 05/20/2013   Dr.Rourk- inadequate prep, normal appearing rectum, grossly normal colon aside from pancolonic diverticula, normal terminal ileum bx= unremarkable colonic mucosa. Due for early interval 2016.    COLONOSCOPY WITH PROPOFOL N/A 04/26/2014   QIH:[id number] ileum/one colon polyp removed/moderate pan-colonic diverticulosis/large internal hemorrhoids   COLONOSCOPY WITH PROPOFOL N/A 12/23/2014   Dr.Rourk- minimal internal hemorrhoids, pancolonic diverticulosis   COLONOSCOPY WITH PROPOFOL N/A 08/23/2016   Procedure: COLONOSCOPY WITH PROPOFOL;  Surgeon: Danie Binder, MD;  [facility];  Service:  Endoscopy;  Laterality: N/A;   COLONOSCOPY WITH PROPOFOL N/A 12/24/2018   Pancolonic diverticulosis, normal TI, one 5 mm polyp in rectum (tubular adenoma). Somewhat friable hemorrhagic mucosa in area of IC valve/cecum, possibly related to scope trauma s/p biopsy.    DEBRIDEMENT OF ABDOMINAL WALL ABSCESS N/A 02/08/2013   Procedure: DEBRIDEMENT OF ABDOMINAL WALL ABSCESS;  Surgeon: Jamesetta So, MD;  Location: [facility];  Service: General;  Laterality: N/A;   ESOPHAGOGASTRODUODENOSCOPY (EGD) WITH PROPOFOL N/A 05/20/2013   Dr.Rourk- s/p prior gastric surgery with normal esophagus, residual gastric mucosa and patent efferent limb   ESOPHAGOGASTRODUODENOSCOPY (EGD) WITH PROPOFOL N/A 02/03/2014   Dr. Gala Romney:  s/p hemigastrectomy with retained gastric contents. Residual gastric mucosa and efferent limb appeared normal  otherwise. Query gastroparesis.    ESOPHAGOGASTRODUODENOSCOPY (EGD) WITH PROPOFOL N/A 04/26/2014   IEP:PIRJJOAC'Z ring/small HH/mild non-erosive gasrtitis/normal anastomosis   ESOPHAGOGASTRODUODENOSCOPY (EGD) WITH PROPOFOL N/A 12/23/2014   Dr.Rourk- s/p prior hemigastrctomy, active oozing from anastomotic suture site, hemostasis achieved   ESOPHAGOGASTRODUODENOSCOPY (EGD) WITH PROPOFOL N/A 05/23/2016   Dr. 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    ESOPHAGOGASTRODUODENOSCOPY (EGD) WITH PROPOFOL N/A 05/11/2017   Procedure: ESOPHAGOGASTRODUODENOSCOPY (EGD) WITH PROPOFOL;  Surgeon: Daneil Dolin, MD;  [facility];  Service: Endoscopy;  Laterality: N/A;   ESOPHAGOGASTRODUODENOSCOPY (EGD) WITH PROPOFOL N/A 06/07/2021   Procedure: ESOPHAGOGASTRODUODENOSCOPY (EGD) WITH PROPOFOL;  Surgeon: Daneil Dolin, MD;  [facility];  Service: Endoscopy;  Laterality: N/A;  11:45am   EXCISION OF MESH  08/2019   HEMORRHOID SURGERY N/A 06/18/2017   Procedure: THREE COLUMN EXTENSIVE HEMORRHOIDECTOMY;  Surgeon: Virl Cagey, MD;  Location: [facility];  Service: General;  Laterality: N/A;   INCISION AND DRAINAGE ABSCESS N/A 01/06/2017   Procedure: INCISION AND DRAINAGE ABDOMINAL WALL ABSCESS;  Surgeon: Aviva Signs, MD;  Location: [facility];  Service: General;  Laterality: N/A;   LEFT HEART CATH AND CORONARY ANGIOGRAPHY N/A 05/09/2020   Procedure: LEFT HEART CATH AND CORONARY ANGIOGRAPHY;  Surgeon: Nigel Mormon, MD;  Location: [facility];  Service: Cardiovascular;  Laterality: N/A;   POLYPECTOMY  12/24/2018   Procedure: POLYPECTOMY;  Surgeon: Daneil Dolin, MD;  [facility];  Service: Endoscopy;;  colon   tendon repar Right    wrist   WOUND EXPLORATION Right 06/24/2013   Procedure: exploration of traumatic wound right wrist;  Surgeon: Tennis Must, MD;  Location: [facility];  Service:  Orthopedics;  Laterality: Right;    Social History: Social History   Tobacco Use   Smoking status: Every Day    Packs/day: 0.50    Years: 35.00    Pack years: 17.50    Types: Cigarettes   Smokeless tobacco: Never  Vaping Use   Vaping Use: Never used  Substance Use Topics   Alcohol use: Not Currently    Comment: Occasionally, 1-2 beers on occasion.   Drug use: Not Currently    Types: Cocaine    Comment: no cocaine in4  years per patient (08/20/21)    ROS: Negative for fevers, chills. Positive for headaches. All other systems reviewed and negative unless stated otherwise in HPI.   Physical Exam:   Vital Signs: BP (!) 148/104   Pulse 68   Ht '5\' 3"'$  (1.6 m)   Wt 155 lb 9.6 oz (70.6 kg)   LMP  (LMP Unknown)   BMI 27.56 kg/m  GENERAL: well appearing,in  no acute distress,alert SKIN:  Color, texture, turgor normal. No rashes or lesions HEAD:  Normocephalic/atraumatic. CV:  RRR RESP: Normal respiratory effort MSK: +tenderness to palpation over bilateral occiput, neck, and shoulders  NEUROLOGICAL: Mental Status: Alert, oriented to person, place and time,Follows commands Cranial Nerves: PERRL, visual fields intact to confrontation, extraocular movements intact, facial sensation intact, no facial droop or ptosis, hearing grossly intact, no dysarthria Motor: muscle strength 5/5 both upper and lower extremities, Reflexes: 2+ throughout Sensation: intact to light touch all 4 extremities Coordination: Finger-to- nose-finger intact bilaterally Gait: normal-based   IMPRESSION: 56 year old female with a history of  SLE, depression, smoking, CAD who presents for evaluation of headaches. MRI, MRA, CTV, and LP were negative for acute process. Her clinical presentation is most consistent with migraine. Would avoid triptans given history of CAD and motor weakness associated with her migraines. Will start Ubrelvy for rescue. 5 day course of muscle relaxer prescribed to help break current  headache cycle.  PLAN: -Robaxin 500 mg TID x5 days -Rescue: Start Ubrelvy 100 mg PRN -Counseled to avoid triptans due to risk of vasoconstriction -next steps: consider neck PT, Topamax if headaches persist   I spent a total of 40 minutes chart reviewing and counseling the patient. Headache education was done. Discussed treatment options including preventive and acute medications, and physical therapy. Discussed medication side effects, adverse reactions and drug interactions. Written educational materials and patient instructions outlining all of the above were given.  Follow-up: 5 months   Genia Harold, MD 08/20/2021   12:05 PM

## 2021-08-20 NOTE — Telephone Encounter (Signed)
Roselyn Meier PA, Key: [id number] , triptans contraindicated. Your information has been sent to [facility].

## 2021-08-21 NOTE — Telephone Encounter (Signed)
She has 5 headache days and 25 headache free days per month. We should be able to appeal with those numbers

## 2021-08-21 NOTE — Telephone Encounter (Signed)
NURTEC TAB '75MG'$  ODT is denied for not meeting the prior authorization requirement(s). Medication authorization requires the following: (1) You have less than 15 headache days per month. Sent to MD.

## 2021-08-21 NOTE — Telephone Encounter (Signed)
Began new PA, Key: [id number] We are unable to process your request for prior authorization for NURTEC TAB '75MG'$  ODT for the above member due to [facility] has a denied request on file for NURTEC TAB '75MG'$  ODT for this member. Please refer to the appeals process outlined in the original denial or contact Prior Authorization Department at [phone] for further questions. Called [facility] PA line, spoke with Shell who transferred to appeals dept, ph [phone]. Spoke with Angus Palms, [facility] who stated to fax clinical notes to part D appeals and grievance dept, [phone], turn around 7 days. Office note faxed to appeals dept.

## 2021-08-23 NOTE — Telephone Encounter (Signed)
ERROR

## 2021-09-05 HISTORY — PX: OTHER SURGICAL HISTORY: SHX169

## 2021-09-06 ENCOUNTER — Ambulatory Visit: Payer: Medicare Other | Admitting: Internal Medicine

## 2021-09-10 ENCOUNTER — Telehealth: Payer: Self-pay

## 2021-09-10 MED ORDER — ESOMEPRAZOLE MAGNESIUM 40 MG PO CPDR
40.0000 mg | DELAYED_RELEASE_CAPSULE | Freq: Every morning | ORAL | 5 refills | Status: DC
Start: 2021-09-10 — End: 2021-09-14

## 2021-09-10 NOTE — Telephone Encounter (Signed)
Completed.

## 2021-09-10 NOTE — Telephone Encounter (Signed)
Last ov 05/15/21. Refill request received for Esomeprazole magnesium 40 mg dr caps, qty: 30 to be sent to [facility], [place].

## 2021-09-11 ENCOUNTER — Encounter: Payer: Self-pay | Admitting: *Deleted

## 2021-09-11 NOTE — Telephone Encounter (Signed)
Called [facility] to check on nurtec PA, spoke with Renea Ee who stated it was approved until 03/17/22. Sent my chart to inform patient.

## 2021-09-12 ENCOUNTER — Other Ambulatory Visit: Payer: Self-pay | Admitting: *Deleted

## 2021-09-12 ENCOUNTER — Ambulatory Visit: Payer: Medicare Other | Admitting: Gastroenterology

## 2021-09-12 DIAGNOSIS — G43101 Migraine with aura, not intractable, with status migrainosus: Secondary | ICD-10-CM

## 2021-09-12 MED ORDER — NURTEC 75 MG PO TBDP: 75.0000 mg | ORAL_TABLET | ORAL | 6 refills | Status: DC | PRN

## 2021-09-13 NOTE — Progress Notes (Signed)
Primary Care Physician:  Carrolyn Meiers, MD  Primary GI: Dr. Gala Romney  Patient Location: Home   Provider Location: [facility] office   Reason for Visit: Follow-up   Persons present on the virtual encounter, with roles: Aliene Altes, PA-C (Provider), Angel Price (patient)   Total time (minutes) spent on medical discussion: 15 minutes  Virtual Visit via video note Due to COVID-19, visit is conducted virtually and was requested by patient.   I connected with Angel Price on 09/14/21 at  9:30 AM EDT by video and verified that I am speaking with the correct person using two identifiers.   I discussed the limitations, risks, security and privacy concerns of performing an evaluation and management service by video and the availability of in person appointments. I also discussed with the patient that there may be a patient responsible charge related to this service. The patient expressed understanding and agreed to proceed.  Chief Complaint  Patient presents with   Follow-up    Follow up after endoscopy.     History of Present Illness:  Angel Price is a 56 y.o. female presenting today for follow-up.   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   Last seen in our office 05/15/2021.  Reports she had been doing well following her hernia surgery in 2021 until a few months ago when she started having recurrent nausea/vomiting, worse postprandially.  If bending over, food comes back up.  Also with dysphagia, feeling that foods feel upper chest, followed by regurgitation.  Occasional streaks of blood in emesis.  Daily abdominal pain with occasional severe epigastric pain.  Sometimes wakes her up at night  though overall pain was worse after meals.  Bowel movements are liquid/watery for the last few months. Had recent upper GI series couple weeks prior without stricture. Noted prior CT in May 2022  with wall thickening about the enterostomy site in the left abdomen could be due to underdistention or possibly inflammatory change. Some small bowel loops appear adhesed to the anterior abdominal wall. Since prior exam, new hernia to right of midline which contains loops of small and large bowel without obstruction. Queried delayed gastric emptying versus ulceration versus functional obstruction contributing to N/V. Suspected abdominal pain multifactorial, possibly related to ventral hernia and adhesions as she was noted to have increased pain over hernia on exam. Plan included labs, abdominal x-ray, and if negative, proceed with EGD.  Also recommended follow-up with surgeon regarding ventral hernia when appropriate.  CBC, CMP, lipase with no acute abnormalities.  Abdominal x-ray with no obstruction or constipation/impaction.  She was scheduled for EGD.  Also recommended completing stool studies.  C. difficile testing was never completed.  EGD 06/07/2021: Normal esophagus, large amount of retained gastric contents in the stomach, procedure aborted.  Noted last solid food intake was 1730-day prior and exam was performed at 1110.  Recommended stopping pantoprazole 20 mg, start Nexium 40 mg daily, 10-15 pound weight loss between now and end of the year, office visit in 6 weeks.  Today: Reports she had incisional hernia repair in [place] on 6/21.  Her abdominal pain has improved since that time, only with pain at the incision site.  She is currently taking oxycodone a couple times a day for her  pain and has been having constipation since her surgery.  She is taking a stool softener in the morning and evening which is helping, but still having hard bowel movements that require straining.  Her last bowel movement was  yesterday.  Reports prior to her surgery, she was still having diarrhea with multiple watery stools a day as well as nocturnal stools.  Denies ever having any BRBPR or melena.  She continues to have daily nausea and vomiting.  States vomiting usually occurs within 30 minutes of eating.  She wakes up with nausea.  She does have early satiety.  Bloating after eating.  She takes Phenergan about every 6 hours which helps some.  Zofran is not helpful.  Feels well after vomiting and is hungry.  She has not had any significant weight loss.  Denies any typical heartburn symptoms.  She is taking Nexium 40 mg a day, but has not noticed any change in her nausea or vomiting symptoms.  24-hour diet recall (yesterday): Bowl of cereal and apple for breakfast, hotdog for lunch, spaghetti for dinner.  Continues to have intermittent dysphagia.  Sensation of foods or liquids getting hung in her midesophagus 2 to 3 days a week.   Allergic to reglan   Past Medical History:  Diagnosis Date   Abscess    soft tissue   Adrenal mass (HCC)    Alcohol abuse    Anemia    Anxiety    Blood transfusion without reported diagnosis    Chronic abdominal pain    Chronic wound infection of abdomen    Colon polyp    colonoscopy 04/2014   Depression    Discoid lupus erythematosus    Diverticulosis    colonoscopy 04/2014 moderat pan colonic   Drug-seeking behavior    Gastritis    EGD 05/2014   Gastroparesis 01/2014   GERD (gastroesophageal reflux disease)    Hemorrhoid    internal large   Hiatal hernia    History of Billroth II operation    Hypertension    Lung nodule    CT 02/2014 needs repeat 1 month   Lung nodule < 6cm on CT 04/25/2014   Lupus (HCC)    Malingering    Migraine    Nausea and vomiting    chronic, recurrent   Pancreatitis    Schatzki's ring    patent per EGD 04/2014   Sickle cell trait ([place])    Stroke ([place])    Suicide attempt ([place])    Thyroid disease 2000   overactive, radiation      Past Surgical History:  Procedure Laterality Date   ABDOMINAL HYSTERECTOMY  2013   [place]   ABDOMINAL HYSTERECTOMY     ABDOMINAL SURGERY     ADRENALECTOMY Right    AGILE CAPSULE N/A 01/05/2015   Procedure: AGILE CAPSULE;  Surgeon: Daneil Dolin, MD;  [facility];  Service: Endoscopy;  Laterality: N/A;  0700   Billroth II procedure      [place], first 2000, 2005/2006.   bilroth 2     BIOPSY  05/20/2013   Procedure: BIOPSIES OF ASCENDING AND SIGMOID COLON;  Surgeon: Daneil Dolin, MD;  Location: [facility];  Service: Endoscopy;;   BIOPSY  04/26/2014   Procedure: BIOPSIES;  Surgeon: Danie Binder, MD;  Location: [facility];  Service: Endoscopy;;   BIOPSY  12/24/2018   Procedure: BIOPSY;  Surgeon: Daneil Dolin, MD;  [facility];  Service: Endoscopy;;  colon   CHOLECYSTECTOMY  COLONOSCOPY     in [place]   COLONOSCOPY WITH PROPOFOL N/A 05/20/2013   Dr.Rourk- inadequate prep, normal appearing rectum, grossly normal colon aside from pancolonic diverticula, normal terminal ileum bx= unremarkable colonic mucosa. Due for early interval 2016.    COLONOSCOPY WITH PROPOFOL N/A 04/26/2014   [facility]:WCBJSE ileum/one colon polyp removed/moderate pan-colonic diverticulosis/large internal hemorrhoids   COLONOSCOPY WITH PROPOFOL N/A 12/23/2014   Dr.Rourk- minimal internal hemorrhoids, pancolonic diverticulosis   COLONOSCOPY WITH PROPOFOL N/A 08/23/2016   Procedure: COLONOSCOPY WITH PROPOFOL;  Surgeon: Danie Binder, MD;  [facility];  Service: Endoscopy;  Laterality: N/A;   COLONOSCOPY WITH PROPOFOL N/A 12/24/2018   Pancolonic diverticulosis, normal TI, one 5 mm polyp in rectum (tubular adenoma). Somewhat friable hemorrhagic mucosa in area of IC valve/cecum, possibly related to scope trauma s/p biopsy. Repeat in 7 years.   DEBRIDEMENT OF ABDOMINAL WALL ABSCESS N/A 02/08/2013   Procedure: DEBRIDEMENT OF ABDOMINAL WALL ABSCESS;  Surgeon: Jamesetta So, MD;   Location: [facility];  Service: General;  Laterality: N/A;   ESOPHAGOGASTRODUODENOSCOPY (EGD) WITH PROPOFOL N/A 05/20/2013   Dr.Rourk- s/p prior gastric surgery with normal esophagus, residual gastric mucosa and patent efferent limb   ESOPHAGOGASTRODUODENOSCOPY (EGD) WITH PROPOFOL N/A 02/03/2014   Dr. Gala Romney:  s/p hemigastrectomy with retained gastric contents. Residual gastric mucosa and efferent limb appeared normal otherwise. Query gastroparesis.    ESOPHAGOGASTRODUODENOSCOPY (EGD) WITH PROPOFOL N/A 04/26/2014   GBT:DVVOHYWV'P ring/small HH/mild non-erosive gasrtitis/normal anastomosis   ESOPHAGOGASTRODUODENOSCOPY (EGD) WITH PROPOFOL N/A 12/23/2014   Dr.Rourk- s/p prior hemigastrctomy, active oozing from anastomotic suture site, hemostasis achieved   ESOPHAGOGASTRODUODENOSCOPY (EGD) WITH PROPOFOL N/A 05/23/2016   Dr. 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    ESOPHAGOGASTRODUODENOSCOPY (EGD) WITH PROPOFOL N/A 05/11/2017   Procedure: ESOPHAGOGASTRODUODENOSCOPY (EGD) WITH PROPOFOL;  Surgeon: Daneil Dolin, MD;  [facility];  Service: Endoscopy;  Laterality: N/A;   ESOPHAGOGASTRODUODENOSCOPY (EGD) WITH PROPOFOL N/A 06/07/2021   Procedure: ESOPHAGOGASTRODUODENOSCOPY (EGD) WITH PROPOFOL;  Surgeon: Daneil Dolin, MD;  [facility];  Service: Endoscopy;  Laterality: N/A;  11:45am   EXCISION OF MESH  08/2019   HEMORRHOID SURGERY N/A 06/18/2017   Procedure: THREE COLUMN EXTENSIVE HEMORRHOIDECTOMY;  Surgeon: Virl Cagey, MD;  Location: [facility];  Service: General;  Laterality: N/A;   INCISION AND DRAINAGE ABSCESS N/A 01/06/2017   Procedure: INCISION AND DRAINAGE ABDOMINAL WALL ABSCESS;  Surgeon: Aviva Signs, MD;  Location: [facility];  Service: General;  Laterality: N/A;   incisional hernial repair  09/05/2021   [place]   LEFT HEART CATH AND CORONARY ANGIOGRAPHY N/A 05/09/2020    Procedure: LEFT HEART CATH AND CORONARY ANGIOGRAPHY;  Surgeon: Nigel Mormon, MD;  Location: [facility];  Service: Cardiovascular;  Laterality: N/A;   POLYPECTOMY  12/24/2018   Procedure: POLYPECTOMY;  Surgeon: Daneil Dolin, MD;  [facility];  Service: Endoscopy;;  colon   tendon repar Right    wrist   WOUND EXPLORATION Right 06/24/2013   Procedure: exploration of traumatic wound right wrist;  Surgeon: Tennis Must, MD;  Location: [facility];  Service: Orthopedics;  Laterality: Right;     Current Meds  Medication Sig   amLODipine (NORVASC) 10 MG tablet Take 10 mg by mouth daily.   aspirin EC 81 MG EC tablet Take 1 tablet (81 mg total) by mouth daily.   atenolol (TENORMIN) 50 MG tablet Take 50  mg by mouth daily.   baclofen (LIORESAL) 10 MG tablet Take 10 mg by mouth 2 (two) times daily.   diltiazem (CARDIZEM CD) 240 MG 24 hr capsule TAKE 1 CAPSULE(240 MG) BY MOUTH DAILY   DULoxetine (CYMBALTA) 30 MG capsule Take 30 mg by mouth 2 (two) times daily.   esomeprazole (NEXIUM) 40 MG capsule Take 1 capsule (40 mg total) by mouth 2 (two) times daily before a meal.   hydroxychloroquine (PLAQUENIL) 200 MG tablet Take 1.5 tablets (300 mg total) by mouth daily.   isosorbide mononitrate (IMDUR) 60 MG 24 hr tablet TAKE 1/2 TABLET BY MOUTH DAILY   lisinopril-hydrochlorothiazide (ZESTORETIC) 20-12.5 MG tablet Take 1 tablet by mouth every morning.   mirtazapine (REMERON) 15 MG tablet Take 15 mg by mouth at bedtime as needed.   promethazine (PHENERGAN) 12.5 MG tablet Take 12.5 mg by mouth every 6 (six) hours as needed for nausea or vomiting.    rosuvastatin (CRESTOR) 20 MG tablet Take 1 tablet (20 mg total) by mouth daily. (Patient taking differently: Take 20 mg by mouth at bedtime.)   thiamine 100 MG tablet Take 100 mg by mouth daily.   tobramycin-dexamethasone (TOBRADEX) ophthalmic solution SMARTSIG:In Eye(s)   [DISCONTINUED] esomeprazole (NEXIUM) 40 MG capsule Take 1 capsule (40  mg total) by mouth every morning.     Family History  Problem Relation Age of Onset   Drug abuse Mother    Lung cancer Father    Cancer Father        mets   Drug abuse Sister    Drug abuse Brother    Breast cancer Maternal Aunt    Bipolar disorder Maternal Aunt    Drug abuse Maternal Aunt    Colon cancer Maternal Grandmother        late 58s, early 58s   Bipolar disorder Paternal Grandfather    Brain cancer Son    Schizophrenia Son    Cancer Son        brain   Bipolar disorder Cousin    Liver disease Neg Hx     Social History   Socioeconomic History   Marital status: Divorced    Spouse name: Not on file   Number of children: 4   Years of education: 10   Highest education level: Not on file  Occupational History   Not on file  Tobacco Use   Smoking status: Every Day    Packs/day: 0.50    Years: 35.00    Total pack years: 17.50    Types: Cigarettes   Smokeless tobacco: Never  Vaping Use   Vaping Use: Never used  Substance and Sexual Activity   Alcohol use: Yes    Comment: 2 8 oz beer every other day.   Drug use: Not Currently    Types: Cocaine    Comment: no cocaine in4  years per patient (08/20/21)   Sexual activity: Not Currently    Birth control/protection: Surgical  Other Topics Concern   Not on file  Social History Narrative   Caffeine- tea occass   Social Determinants of Health   Financial Resource Strain: Low Risk  (03/15/2019)   Overall Financial Resource Strain (CARDIA)    Difficulty of Paying Living Expenses: Not very hard  Food Insecurity: No Food Insecurity (03/15/2019)   Hunger Vital Sign    Worried About Running Out of Food in the Last Year: Never true    Ran Out of Food in the Last Year: Never true  Transportation Needs: No  Transportation Needs (03/15/2019)   PRAPARE - Hydrologist (Medical): No    Lack of Transportation (Non-Medical): No  Physical Activity: Inactive (03/15/2019)   Exercise Vital Sign     Days of Exercise per Week: 0 days    Minutes of Exercise per Session: 0 min  Stress: Stress Concern Present (03/15/2019)   [place]    Feeling of Stress : Rather much  Social Connections: Moderately Isolated (03/15/2019)   Social Connection and Isolation Panel [NHANES]    Frequency of Communication with Friends and Family: More than three times a week    Frequency of Social Gatherings with Friends and Family: More than three times a week    Attends Religious Services: 1 to 4 times per year    Active Member of [employer] or Organizations: No    Attends Archivist Meetings: Never    Marital Status: Divorced       Review of Systems: Gen: Denies fever, chills, cold or flu like symptoms, pre-syncope, or syncope.  CV: Denies chest pain, palpitations. Resp: See HPI GI: see HPI Derm: Denies rash. Psych: Denies depression, anxiety. Heme: See HPI  Observations/Objective: No distress. Alert and oriented. Pleasant. Well nourished. Normal mood and affect. Unable to perform complete physical exam due to video encounter.    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   Intractable nausea/vomiting: Started in the latter part of 2022.  Wakes with nausea in the morning which can be intermittent throughout the day, but vomiting occurs within 30 minutes of eating.  Has early satiety, postprandial bloating.  Denies any significant weight loss.  Recent EGD in March with large amount of  retained gastric contents in the stomach, procedure aborted.  Protonix was changed to Nexium 40 mg daily, but she had no change in symptoms.  Prior upper GI series in February 2023 without stricture.  Prior CT in May 2022 with wall thickening about the enterostomy site in the left abdomen due to underdistention versus inflammatory change.   I suspect the primary etiology of her nausea/vomiting is secondary to gastroparesis.  However, we need to rule out other etiologies such as PUD and H. pylori.  We will plan to repeat an EGD in the near future with clear liquids the entire day prior to her procedure.  She was counseled extensively today on gastroparesis diet/lifestyle.  I will also maximize her acid suppression by increasing Nexium to twice daily to see if this provides any additional benefit.   Dysphagia: Intermittent food and liquid dysphagia with sensation of items getting hung in her mid chest.  Recent EGD did not reveal any esophageal abnormalities, but no dilation was performed as she had retained gastric contents.  Query underlying motility disorder and we may need to pursue barium pill esophagram in the near future; however, we are repeating an EGD due to nausea/vomiting discussed above. Will add on possible dilation as she may benefit from empiric dilation.  Abdominal pain: Previously with intermittent severe episodes of epigastric abdominal pain, the patient reports this has resolved since having ventral hernia repair on 09/05/2021.  Now  only with some pain at her incision site.  Diarrhea: Previously reported several months of watery diarrhea with multiple bowel movements during the day along with nocturnal stools.  No BRBPR, melena, unintentional weight loss. Stool studies previously recommended, but not completed.  Diarrhea has resolved following ventral hernia repair and starting oxycodone for postsurgical pain, currently dealing with constipation which is discussed below.  She will monitor  for return of diarrhea and let us know if this occurs.  Constipation: Likely opioid induced following recent ventral hernia repair, now taking oxycodone twice daily for postsurgical pain.  Taking Colace twice daily, but symptoms not adequately managed.  Recommended starting MiraLAX daily to twice daily.    Plan: Proceed with upper endoscopy +/- dilation with propofol by Dr. Gala Romney or Dr. Abbey Chatters (whom ever has the earlier availability). The risks, benefits, and alternatives have been discussed with the patient in detail. The patient states understanding and desires to proceed. ASA 3 Clear liquids 1 day prior to procedure UDS at preop Increase omeprazole to 40 mg twice daily.  New prescription sent to pharmacy. Counseled extensively on gastroparesis diet/lifestyle.  Separate written instructions and handouts provided will be mailed to patient. Continue Phenergan for nausea/vomiting. Continue Colace twice daily. Start MiraLAX 17 g in 8 ounces of water daily to twice daily. Follow-up after procedure.     I discussed the assessment and treatment plan with the patient. The patient was provided an opportunity to ask questions and all were answered. The patient agreed with the plan and demonstrated an understanding of the instructions.   The patient was advised to call back or seek an in-person evaluation if the symptoms worsen or if the condition fails to improve as anticipated.  I provided 15 minutes of video-face-to-face time during this encounter.  Aliene Altes, PA-C [facility]  09/14/2021

## 2021-09-13 NOTE — H&P (View-Only) (Signed)
Primary Care Physician:  Carrolyn Meiers, MD  Primary GI: Dr. Gala Romney  Patient Location: Home   Provider Location: [facility] office   Reason for Visit: Follow-up   Persons present on the virtual encounter, with roles: Aliene Altes, PA-C (Provider), Angel Price (patient)   Total time (minutes) spent on medical discussion: 15 minutes  Virtual Visit via video note Due to COVID-19, visit is conducted virtually and was requested by patient.   I connected with Angel Price on 09/14/21 at  9:30 AM EDT by video and verified that I am speaking with the correct person using two identifiers.   I discussed the limitations, risks, security and privacy concerns of performing an evaluation and management service by video and the availability of in person appointments. I also discussed with the patient that there may be a patient responsible charge related to this service. The patient expressed understanding and agreed to proceed.  Chief Complaint  Patient presents with   Follow-up    Follow up after endoscopy.     History of Present Illness:  Angel Price is a 56 y.o. female presenting today for follow-up.   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   Last seen in our office 05/15/2021.  Reports she had been doing well following her hernia surgery in 2021 until a few months ago when she started having recurrent nausea/vomiting, worse postprandially.  If bending over, food comes back up.  Also with dysphagia, feeling that foods feel upper chest, followed by regurgitation.  Occasional streaks of blood in emesis.  Daily abdominal pain with occasional severe epigastric pain.  Sometimes wakes her up at night  though overall pain was worse after meals.  Bowel movements are liquid/watery for the last few months. Had recent upper GI series couple weeks prior without stricture. Noted prior CT in May 2022  with wall thickening about the enterostomy site in the left abdomen could be due to underdistention or possibly inflammatory change. Some small bowel loops appear adhesed to the anterior abdominal wall. Since prior exam, new hernia to right of midline which contains loops of small and large bowel without obstruction. Queried delayed gastric emptying versus ulceration versus functional obstruction contributing to N/V. Suspected abdominal pain multifactorial, possibly related to ventral hernia and adhesions as she was noted to have increased pain over hernia on exam. Plan included labs, abdominal x-ray, and if negative, proceed with EGD.  Also recommended follow-up with surgeon regarding ventral hernia when appropriate.  CBC, CMP, lipase with no acute abnormalities.  Abdominal x-ray with no obstruction or constipation/impaction.  She was scheduled for EGD.  Also recommended completing stool studies.  C. difficile testing was never completed.  EGD 06/07/2021: Normal esophagus, large amount of retained gastric contents in the stomach, procedure aborted.  Noted last solid food intake was 1730-day prior and exam was performed at 1110.  Recommended stopping pantoprazole 20 mg, start Nexium 40 mg daily, 10-15 pound weight loss between now and end of the year, office visit in 6 weeks.  Today: Reports she had incisional hernia repair in [name] on 6/21.  Her abdominal pain has improved since that time, only with pain at the incision site.  She is currently taking oxycodone a couple times a day for her  pain and has been having constipation since her surgery.  She is taking a stool softener in the morning and evening which is helping, but still having hard bowel movements that require straining.  Her last bowel movement was  yesterday.  Reports prior to her surgery, she was still having diarrhea with multiple watery stools a day as well as nocturnal stools.  Denies ever having any BRBPR or melena.  She continues to have daily nausea and vomiting.  States vomiting usually occurs within 30 minutes of eating.  She wakes up with nausea.  She does have early satiety.  Bloating after eating.  She takes Phenergan about every 6 hours which helps some.  Zofran is not helpful.  Feels well after vomiting and is hungry.  She has not had any significant weight loss.  Denies any typical heartburn symptoms.  She is taking Nexium 40 mg a day, but has not noticed any change in her nausea or vomiting symptoms.  24-hour diet recall (yesterday): Bowl of cereal and apple for breakfast, hotdog for lunch, spaghetti for dinner.  Continues to have intermittent dysphagia.  Sensation of foods or liquids getting hung in her midesophagus 2 to 3 days a week.   Allergic to reglan   Past Medical History:  Diagnosis Date   Abscess    soft tissue   Adrenal mass (HCC)    Alcohol abuse    Anemia    Anxiety    Blood transfusion without reported diagnosis    Chronic abdominal pain    Chronic wound infection of abdomen    Colon polyp    colonoscopy 04/2014   Depression    Discoid lupus erythematosus    Diverticulosis    colonoscopy 04/2014 moderat pan colonic   Drug-seeking behavior    Gastritis    EGD 05/2014   Gastroparesis 01/2014   GERD (gastroesophageal reflux disease)    Hemorrhoid    internal large   Hiatal hernia    History of Billroth II operation    Hypertension    Lung nodule    CT 02/2014 needs repeat 1 month   Lung nodule < 6cm on CT 04/25/2014   Lupus (HCC)    Malingering    Migraine    Nausea and vomiting    chronic, recurrent   Pancreatitis    Schatzki's ring    patent per EGD 04/2014   Sickle cell trait ([place])    Stroke ([place])    Suicide attempt ([place])    Thyroid disease 2000   overactive, radiation      Past Surgical History:  Procedure Laterality Date   ABDOMINAL HYSTERECTOMY  2013   [place]   ABDOMINAL HYSTERECTOMY     ABDOMINAL SURGERY     ADRENALECTOMY Right    AGILE CAPSULE N/A 01/05/2015   Procedure: AGILE CAPSULE;  Surgeon: Daneil Dolin, MD;  [facility];  Service: Endoscopy;  Laterality: N/A;  0700   Billroth II procedure      [place], first 2000, 2005/2006.   bilroth 2     BIOPSY  05/20/2013   Procedure: BIOPSIES OF ASCENDING AND SIGMOID COLON;  Surgeon: Daneil Dolin, MD;  Location: [facility];  Service: Endoscopy;;   BIOPSY  04/26/2014   Procedure: BIOPSIES;  Surgeon: Danie Binder, MD;  Location: [facility];  Service: Endoscopy;;   BIOPSY  12/24/2018   Procedure: BIOPSY;  Surgeon: Daneil Dolin, MD;  [facility];  Service: Endoscopy;;  colon   CHOLECYSTECTOMY  COLONOSCOPY     in [place]   COLONOSCOPY WITH PROPOFOL N/A 05/20/2013   Dr.Rourk- inadequate prep, normal appearing rectum, grossly normal colon aside from pancolonic diverticula, normal terminal ileum bx= unremarkable colonic mucosa. Due for early interval 2016.    COLONOSCOPY WITH PROPOFOL N/A 04/26/2014   WUJ:WJXBJY ileum/one colon polyp removed/moderate pan-colonic diverticulosis/large internal hemorrhoids   COLONOSCOPY WITH PROPOFOL N/A 12/23/2014   Dr.Rourk- minimal internal hemorrhoids, pancolonic diverticulosis   COLONOSCOPY WITH PROPOFOL N/A 08/23/2016   Procedure: COLONOSCOPY WITH PROPOFOL;  Surgeon: Danie Binder, MD;  [facility];  Service: Endoscopy;  Laterality: N/A;   COLONOSCOPY WITH PROPOFOL N/A 12/24/2018   Pancolonic diverticulosis, normal TI, one 5 mm polyp in rectum (tubular adenoma). Somewhat friable hemorrhagic mucosa in area of IC valve/cecum, possibly related to scope trauma s/p biopsy. Repeat in 7 years.   DEBRIDEMENT OF ABDOMINAL WALL ABSCESS N/A 02/08/2013   Procedure: DEBRIDEMENT OF ABDOMINAL WALL ABSCESS;  Surgeon: Jamesetta So, MD;   Location: [facility];  Service: General;  Laterality: N/A;   ESOPHAGOGASTRODUODENOSCOPY (EGD) WITH PROPOFOL N/A 05/20/2013   Dr.Rourk- s/p prior gastric surgery with normal esophagus, residual gastric mucosa and patent efferent limb   ESOPHAGOGASTRODUODENOSCOPY (EGD) WITH PROPOFOL N/A 02/03/2014   Dr. Gala Romney:  s/p hemigastrectomy with retained gastric contents. Residual gastric mucosa and efferent limb appeared normal otherwise. Query gastroparesis.    ESOPHAGOGASTRODUODENOSCOPY (EGD) WITH PROPOFOL N/A 04/26/2014   NWG:NFAOZHYQ'M ring/small HH/mild non-erosive gasrtitis/normal anastomosis   ESOPHAGOGASTRODUODENOSCOPY (EGD) WITH PROPOFOL N/A 12/23/2014   Dr.Rourk- s/p prior hemigastrctomy, active oozing from anastomotic suture site, hemostasis achieved   ESOPHAGOGASTRODUODENOSCOPY (EGD) WITH PROPOFOL N/A 05/23/2016   Dr. 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    ESOPHAGOGASTRODUODENOSCOPY (EGD) WITH PROPOFOL N/A 05/11/2017   Procedure: ESOPHAGOGASTRODUODENOSCOPY (EGD) WITH PROPOFOL;  Surgeon: Daneil Dolin, MD;  [facility];  Service: Endoscopy;  Laterality: N/A;   ESOPHAGOGASTRODUODENOSCOPY (EGD) WITH PROPOFOL N/A 06/07/2021   Procedure: ESOPHAGOGASTRODUODENOSCOPY (EGD) WITH PROPOFOL;  Surgeon: Daneil Dolin, MD;  [facility];  Service: Endoscopy;  Laterality: N/A;  11:45am   EXCISION OF MESH  08/2019   HEMORRHOID SURGERY N/A 06/18/2017   Procedure: THREE COLUMN EXTENSIVE HEMORRHOIDECTOMY;  Surgeon: Virl Cagey, MD;  Location: [facility];  Service: General;  Laterality: N/A;   INCISION AND DRAINAGE ABSCESS N/A 01/06/2017   Procedure: INCISION AND DRAINAGE ABDOMINAL WALL ABSCESS;  Surgeon: Aviva Signs, MD;  Location: [facility];  Service: General;  Laterality: N/A;   incisional hernial repair  09/05/2021   [place]   LEFT HEART CATH AND CORONARY ANGIOGRAPHY N/A 05/09/2020    Procedure: LEFT HEART CATH AND CORONARY ANGIOGRAPHY;  Surgeon: Nigel Mormon, MD;  Location: [facility];  Service: Cardiovascular;  Laterality: N/A;   POLYPECTOMY  12/24/2018   Procedure: POLYPECTOMY;  Surgeon: Daneil Dolin, MD;  [facility];  Service: Endoscopy;;  colon   tendon repar Right    wrist   WOUND EXPLORATION Right 06/24/2013   Procedure: exploration of traumatic wound right wrist;  Surgeon: Tennis Must, MD;  Location: [place];  Service: Orthopedics;  Laterality: Right;     Current Meds  Medication Sig   amLODipine (NORVASC) 10 MG tablet Take 10 mg by mouth daily.   aspirin EC 81 MG EC tablet Take 1 tablet (81 mg total) by mouth daily.   atenolol (TENORMIN) 50 MG tablet Take 50  mg by mouth daily.   baclofen (LIORESAL) 10 MG tablet Take 10 mg by mouth 2 (two) times daily.   diltiazem (CARDIZEM CD) 240 MG 24 hr capsule TAKE 1 CAPSULE(240 MG) BY MOUTH DAILY   DULoxetine (CYMBALTA) 30 MG capsule Take 30 mg by mouth 2 (two) times daily.   esomeprazole (NEXIUM) 40 MG capsule Take 1 capsule (40 mg total) by mouth 2 (two) times daily before a meal.   hydroxychloroquine (PLAQUENIL) 200 MG tablet Take 1.5 tablets (300 mg total) by mouth daily.   isosorbide mononitrate (IMDUR) 60 MG 24 hr tablet TAKE 1/2 TABLET BY MOUTH DAILY   lisinopril-hydrochlorothiazide (ZESTORETIC) 20-12.5 MG tablet Take 1 tablet by mouth every morning.   mirtazapine (REMERON) 15 MG tablet Take 15 mg by mouth at bedtime as needed.   promethazine (PHENERGAN) 12.5 MG tablet Take 12.5 mg by mouth every 6 (six) hours as needed for nausea or vomiting.    rosuvastatin (CRESTOR) 20 MG tablet Take 1 tablet (20 mg total) by mouth daily. (Patient taking differently: Take 20 mg by mouth at bedtime.)   thiamine 100 MG tablet Take 100 mg by mouth daily.   tobramycin-dexamethasone (TOBRADEX) ophthalmic solution SMARTSIG:In Eye(s)   [DISCONTINUED] esomeprazole (NEXIUM) 40 MG capsule Take 1 capsule (40  mg total) by mouth every morning.     Family History  Problem Relation Age of Onset   Drug abuse Mother    Lung cancer Father    Cancer Father        mets   Drug abuse Sister    Drug abuse Brother    Breast cancer Maternal Aunt    Bipolar disorder Maternal Aunt    Drug abuse Maternal Aunt    Colon cancer Maternal Grandmother        late 13s, early 56s   Bipolar disorder Paternal Grandfather    Brain cancer Son    Schizophrenia Son    Cancer Son        brain   Bipolar disorder Cousin    Liver disease Neg Hx     Social History   Socioeconomic History   Marital status: Divorced    Spouse name: Not on file   Number of children: 4   Years of education: 10   Highest education level: Not on file  Occupational History   Not on file  Tobacco Use   Smoking status: Every Day    Packs/day: 0.50    Years: 35.00    Total pack years: 17.50    Types: Cigarettes   Smokeless tobacco: Never  Vaping Use   Vaping Use: Never used  Substance and Sexual Activity   Alcohol use: Yes    Comment: 2 8 oz beer every other day.   Drug use: Not Currently    Types: Cocaine    Comment: no cocaine in4  years per patient (08/20/21)   Sexual activity: Not Currently    Birth control/protection: Surgical  Other Topics Concern   Not on file  Social History Narrative   Caffeine- tea occass   Social Determinants of Health   Financial Resource Strain: Low Risk  (03/15/2019)   Overall Financial Resource Strain (CARDIA)    Difficulty of Paying Living Expenses: Not very hard  Food Insecurity: No Food Insecurity (03/15/2019)   Hunger Vital Sign    Worried About Running Out of Food in the Last Year: Never true    Ran Out of Food in the Last Year: Never true  Transportation Needs: No  Transportation Needs (03/15/2019)   PRAPARE - Hydrologist (Medical): No    Lack of Transportation (Non-Medical): No  Physical Activity: Inactive (03/15/2019)   Exercise Vital Sign     Days of Exercise per Week: 0 days    Minutes of Exercise per Session: 0 min  Stress: Stress Concern Present (03/15/2019)   [place]    Feeling of Stress : Rather much  Social Connections: Moderately Isolated (03/15/2019)   Social Connection and Isolation Panel [NHANES]    Frequency of Communication with Friends and Family: More than three times a week    Frequency of Social Gatherings with Friends and Family: More than three times a week    Attends Religious Services: 1 to 4 times per year    Active Member of [employer] or Organizations: No    Attends Archivist Meetings: Never    Marital Status: Divorced       Review of Systems: Gen: Denies fever, chills, cold or flu like symptoms, pre-syncope, or syncope.  CV: Denies chest pain, palpitations. Resp: See HPI GI: see HPI Derm: Denies rash. Psych: Denies depression, anxiety. Heme: See HPI  Observations/Objective: No distress. Alert and oriented. Pleasant. Well nourished. Normal mood and affect. Unable to perform complete physical exam due to video encounter.    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   Intractable nausea/vomiting: Started in the latter part of 2022.  Wakes with nausea in the morning which can be intermittent throughout the day, but vomiting occurs within 30 minutes of eating.  Has early satiety, postprandial bloating.  Denies any significant weight loss.  Recent EGD in March with large amount of  retained gastric contents in the stomach, procedure aborted.  Protonix was changed to Nexium 40 mg daily, but she had no change in symptoms.  Prior upper GI series in February 2023 without stricture.  Prior CT in May 2022 with wall thickening about the enterostomy site in the left abdomen due to underdistention versus inflammatory change.   I suspect the primary etiology of her nausea/vomiting is secondary to gastroparesis.  However, we need to rule out other etiologies such as PUD and H. pylori.  We will plan to repeat an EGD in the near future with clear liquids the entire day prior to her procedure.  She was counseled extensively today on gastroparesis diet/lifestyle.  I will also maximize her acid suppression by increasing Nexium to twice daily to see if this provides any additional benefit.   Dysphagia: Intermittent food and liquid dysphagia with sensation of items getting hung in her mid chest.  Recent EGD did not reveal any esophageal abnormalities, but no dilation was performed as she had retained gastric contents.  Query underlying motility disorder and we may need to pursue barium pill esophagram in the near future; however, we are repeating an EGD due to nausea/vomiting discussed above. Will add on possible dilation as she may benefit from empiric dilation.  Abdominal pain: Previously with intermittent severe episodes of epigastric abdominal pain, the patient reports this has resolved since having ventral hernia repair on 09/05/2021.  Now  only with some pain at her incision site.  Diarrhea: Previously reported several months of watery diarrhea with multiple bowel movements during the day along with nocturnal stools.  No BRBPR, melena, unintentional weight loss. Stool studies previously recommended, but not completed.  Diarrhea has resolved following ventral hernia repair and starting oxycodone for postsurgical pain, currently dealing with constipation which is discussed below.  She will monitor  for return of diarrhea and let us know if this occurs.  Constipation: Likely opioid induced following recent ventral hernia repair, now taking oxycodone twice daily for postsurgical pain.  Taking Colace twice daily, but symptoms not adequately managed.  Recommended starting MiraLAX daily to twice daily.    Plan: Proceed with upper endoscopy +/- dilation with propofol by Dr. Gala Romney or Dr. Abbey Chatters (whom ever has the earlier availability). The risks, benefits, and alternatives have been discussed with the patient in detail. The patient states understanding and desires to proceed. ASA 3 Clear liquids 1 day prior to procedure UDS at preop Increase omeprazole to 40 mg twice daily.  New prescription sent to pharmacy. Counseled extensively on gastroparesis diet/lifestyle.  Separate written instructions and handouts provided will be mailed to patient. Continue Phenergan for nausea/vomiting. Continue Colace twice daily. Start MiraLAX 17 g in 8 ounces of water daily to twice daily. Follow-up after procedure.     I discussed the assessment and treatment plan with the patient. The patient was provided an opportunity to ask questions and all were answered. The patient agreed with the plan and demonstrated an understanding of the instructions.   The patient was advised to call back or seek an in-person evaluation if the symptoms worsen or if the condition fails to improve as anticipated.  I provided 15 minutes of video-face-to-face time during this encounter.  Aliene Altes, PA-C [facility]  09/14/2021

## 2021-09-14 ENCOUNTER — Encounter: Payer: Self-pay | Admitting: Gastroenterology

## 2021-09-14 ENCOUNTER — Telehealth: Payer: Self-pay | Admitting: Gastroenterology

## 2021-09-14 ENCOUNTER — Telehealth: Payer: Self-pay

## 2021-09-14 ENCOUNTER — Encounter: Payer: Self-pay | Admitting: *Deleted

## 2021-09-14 ENCOUNTER — Telehealth (INDEPENDENT_AMBULATORY_CARE_PROVIDER_SITE_OTHER): Payer: Medicare Other | Admitting: Gastroenterology

## 2021-09-14 VITALS — Ht 63.0 in | Wt 154.0 lb

## 2021-09-14 DIAGNOSIS — R197 Diarrhea, unspecified: Secondary | ICD-10-CM | POA: Diagnosis not present

## 2021-09-14 DIAGNOSIS — K3184 Gastroparesis: Secondary | ICD-10-CM | POA: Diagnosis not present

## 2021-09-14 DIAGNOSIS — K5900 Constipation, unspecified: Secondary | ICD-10-CM | POA: Insufficient documentation

## 2021-09-14 DIAGNOSIS — R112 Nausea with vomiting, unspecified: Secondary | ICD-10-CM

## 2021-09-14 DIAGNOSIS — R1310 Dysphagia, unspecified: Secondary | ICD-10-CM

## 2021-09-14 DIAGNOSIS — R1013 Epigastric pain: Secondary | ICD-10-CM

## 2021-09-14 MED ORDER — ESOMEPRAZOLE MAGNESIUM 40 MG PO CPDR: 40.0000 mg | DELAYED_RELEASE_CAPSULE | Freq: Two times a day (BID) | ORAL | 3 refills | Status: DC

## 2021-09-14 NOTE — Telephone Encounter (Signed)
.  RGA

## 2021-09-14 NOTE — Addendum Note (Signed)
Addended by: Cheron Every on: 09/14/2021 10:34 AM   Modules accepted: Orders

## 2021-09-14 NOTE — Telephone Encounter (Signed)
Mindy:  Please arrange EGD +/- dilation with propofol with Dr. Gala Romney or Dr. Abbey Chatters, whoever has the earlier availability (she is a [facility] patient). ASA 3 UDS at pre-op Clear liquid diet entire day prior to procedure.   Dx: Intractable nausea/vomiting, dysphagia.

## 2021-09-14 NOTE — Patient Instructions (Addendum)
We will arrange to have an upper endoscopy with possible stretching of your esophagus in the near future with Dr. Gala Romney or Dr. Abbey Chatters, whoever has the earlier availability. You will have a clear liquid diet the entire day prior to your procedure to ensure you do not have any food in your stomach at the time of your procedure.   Increase Nexium to 40 mg twice daily 30 minutes before breakfast and 30 minutes before dinner. I am sending a new prescription to your pharmacy.   Gastroparesis recommendations:  6 small meals daily Low fat diet Low fiber diet (avoid raw fruits and vegetables). If you continue to have frequent nausea/vomiting with the above recommendations, recommend that you back down to a soft diet only. Still continue with 6 small meals a day. You can drink protein shakes to help with your nutritional intake if this is the case.   See separate handout of additional gastroparesis diet recommendations.   Continue phenergan for nausea/vomiting.   Constipation:  Constipation is likely secondary to the oxycodone you are taking.  Continue taking your stool softener.  Start MiraLAX 17 g in 8 oz of water daily to twice daily for constipation.   We will plan to follow-up with you in the office after your procedure.  Do not hesitate to call if you have any questions or concerns prior to your next visit.  It was very nice meeting you today!  Aliene Altes, PA-C [facility]

## 2021-09-26 NOTE — Patient Instructions (Signed)
DAVY FAUGHT  09/26/2021     '@PREFPERIOPPHARMACY'$ @   Your procedure is scheduled on  10/01/2021.   Report to [facility] at  1300 (1:00) P.M.   Call this number if you have problems the morning of surgery:  [phone]   Remember:  Follow the diet instructions given to you by the office.    Take these medicines the morning of surgery with A SIP OF WATER             norvasc, tenormin, lioresal, cardiazem, cymbalta, nexium, imdur, robaxin (if needed), roxicodoen (if needed), nurtec.     Do not wear jewelry, make-up or nail polish.  Do not wear lotions, powders, or perfumes, or deodorant.  Do not shave 48 hours prior to surgery.  Men may shave face and neck.  Do not bring valuables to the hospital.  [facility] is not responsible for any belongings or valuables.  Contacts, dentures or bridgework may not be worn into surgery.  Leave your suitcase in the car.  After surgery it may be brought to your room.  For patients admitted to the hospital, discharge time will be determined by your treatment team.  Patients discharged the day of surgery will not be allowed to drive home and must have someone with them for 24 hours.    Special instructions:   DO NOT smoke tobacco or vape for 24 hours before your procedure.  Please read over the following fact sheets that you were given. Anesthesia Post-op Instructions and Care and Recovery After Surgery      Upper Endoscopy, Adult, Care After This sheet gives you information about how to care for yourself after your procedure. Your health care provider may also give you more specific instructions. If you have problems or questions, contact your health care provider. What can I expect after the procedure? After the procedure, it is common to have: A sore throat. Mild stomach pain or discomfort. Bloating. Nausea. Follow these instructions at home:  Follow instructions from your health care provider about what to eat or  drink after your procedure. Return to your normal activities as told by your health care provider. Ask your health care provider what activities are safe for you. Take over-the-counter and prescription medicines only as told by your health care provider. If you were given a sedative during the procedure, it can affect you for several hours. Do not drive or operate machinery until your health care provider says that it is safe. Keep all follow-up visits as told by your health care provider. This is important. Contact a health care provider if you have: A sore throat that lasts longer than one day. Trouble swallowing. Get help right away if: You vomit blood or your vomit looks like coffee grounds. You have: A fever. Bloody, black, or tarry stools. A severe sore throat or you cannot swallow. Difficulty breathing. Severe pain in your chest or abdomen. Summary After the procedure, it is common to have a sore throat, mild stomach discomfort, bloating, and nausea. If you were given a sedative during the procedure, it can affect you for several hours. Do not drive or operate machinery until your health care provider says that it is safe. Follow instructions from your health care provider about what to eat or drink after your procedure. Return to your normal activities as told by your health care provider. This information is not intended to replace advice given to you by your health  care provider. Make sure you discuss any questions you have with your health care provider. Document Revised: 01/08/2019 Document Reviewed: 08/04/2017 Elsevier Patient Education  [place]. Esophageal Dilatation Esophageal dilatation, also called esophageal dilation, is a procedure to widen or open a blocked or narrowed part of the esophagus. The esophagus is the part of the body that moves food and liquid from the mouth to the stomach. You may need this procedure if: You have a buildup of scar tissue in your  esophagus that makes it difficult, painful, or impossible to swallow. This can be caused by gastroesophageal reflux disease (GERD). You have cancer of the esophagus. There is a problem with how food moves through your esophagus. In some cases, you may need this procedure repeated at a later time to dilate the esophagus gradually. Tell a health care provider about: Any allergies you have. All medicines you are taking, including vitamins, herbs, eye drops, creams, and over-the-counter medicines. Any problems you or family members have had with anesthetic medicines. Any blood disorders you have. Any surgeries you have had. Any medical conditions you have. Any antibiotic medicines you are required to take before dental procedures. Whether you are pregnant or may be pregnant. What are the risks? Generally, this is a safe procedure. However, problems may occur, including: Bleeding due to a tear in the lining of the esophagus. A hole, or perforation, in the esophagus. What happens before the procedure? Ask your health care provider about: Changing or stopping your regular medicines. This is especially important if you are taking diabetes medicines or blood thinners. Taking medicines such as aspirin and ibuprofen. These medicines can thin your blood. Do not take these medicines unless your health care provider tells you to take them. Taking over-the-counter medicines, vitamins, herbs, and supplements. Follow instructions from your health care provider about eating or drinking restrictions. Plan to have a responsible adult take you home from the hospital or clinic. Plan to have a responsible adult care for you for the time you are told after you leave the hospital or clinic. This is important. What happens during the procedure? You may be given a medicine to help you relax (sedative). A numbing medicine may be sprayed into the back of your throat, or you may gargle the medicine. Your health care  provider may perform the dilatation using various surgical instruments, such as: Simple dilators. This instrument is carefully placed in the esophagus to stretch it. Guided wire bougies. This involves using an endoscope to insert a wire into the esophagus. A dilator is passed over this wire to enlarge the esophagus. Then the wire is removed. Balloon dilators. An endoscope with a small balloon is inserted into the esophagus. The balloon is inflated to stretch the esophagus and open it up. The procedure may vary among health care providers and hospitals. What can I expect after the procedure? Your blood pressure, heart rate, breathing rate, and blood oxygen level will be monitored until you leave the hospital or clinic. Your throat may feel slightly sore and numb. This will get better over time. You will not be allowed to eat or drink until your throat is no longer numb. When you are able to drink, urinate, and sit on the edge of the bed without nausea or dizziness, you may be able to return home. Follow these instructions at home: Take over-the-counter and prescription medicines only as told by your health care provider. If you were given a sedative during the procedure, it can affect  you for several hours. Do not drive or operate machinery until your health care provider says that it is safe. Plan to have a responsible adult care for you for the time you are told. This is important. Follow instructions from your health care provider about any eating or drinking restrictions. Do not use any products that contain nicotine or tobacco, such as cigarettes, e-cigarettes, and chewing tobacco. If you need help quitting, ask your health care provider. Keep all follow-up visits. This is important. Contact a health care provider if: You have a fever. You have pain that is not relieved by medicine. Get help right away if: You have chest pain. You have trouble breathing. You have trouble swallowing. You  vomit blood. You have black, tarry, or bloody stools. These symptoms may represent a serious problem that is an emergency. Do not wait to see if the symptoms will go away. Get medical help right away. Call your local emergency services (911 in the [place] Do not drive yourself to the hospital. Summary Esophageal dilatation, also called esophageal dilation, is a procedure to widen or open a blocked or narrowed part of the esophagus. Plan to have a responsible adult take you home from the hospital or clinic. For this procedure, a numbing medicine may be sprayed into the back of your throat, or you may gargle the medicine. Do not drive or operate machinery until your health care provider says that it is safe. This information is not intended to replace advice given to you by your health care provider. Make sure you discuss any questions you have with your health care provider. Document Revised: 07/21/2019 Document Reviewed: 07/21/2019 Elsevier Patient Education  [place] After This sheet gives you information about how to care for yourself after your procedure. Your health care provider may also give you more specific instructions. If you have problems or questions, contact your health care provider. What can I expect after the procedure? After the procedure, it is common to have: Tiredness. Forgetfulness about what happened after the procedure. Impaired judgment for important decisions. Nausea or vomiting. Some difficulty with balance. Follow these instructions at home: For the time period you were told by your health care provider:     Rest as needed. Do not participate in activities where you could fall or become injured. Do not drive or use machinery. Do not drink alcohol. Do not take sleeping pills or medicines that cause drowsiness. Do not make important decisions or sign legal documents. Do not take care of children on your own. Eating and  drinking Follow the diet that is recommended by your health care provider. Drink enough fluid to keep your urine pale yellow. If you vomit: Drink water, juice, or soup when you can drink without vomiting. Make sure you have little or no nausea before eating solid foods. General instructions Have a responsible adult stay with you for the time you are told. It is important to have someone help care for you until you are awake and alert. Take over-the-counter and prescription medicines only as told by your health care provider. If you have sleep apnea, surgery and certain medicines can increase your risk for breathing problems. Follow instructions from your health care provider about wearing your sleep device: Anytime you are sleeping, including during daytime naps. While taking prescription pain medicines, sleeping medicines, or medicines that make you drowsy. Avoid smoking. Keep all follow-up visits as told by your health care provider. This is important. Contact  a health care provider if: You keep feeling nauseous or you keep vomiting. You feel light-headed. You are still sleepy or having trouble with balance after 24 hours. You develop a rash. You have a fever. You have redness or swelling around the IV site. Get help right away if: You have trouble breathing. You have new-onset confusion at home. Summary For several hours after your procedure, you may feel tired. You may also be forgetful and have poor judgment. Have a responsible adult stay with you for the time you are told. It is important to have someone help care for you until you are awake and alert. Rest as told. Do not drive or operate machinery. Do not drink alcohol or take sleeping pills. Get help right away if you have trouble breathing, or if you suddenly become confused. This information is not intended to replace advice given to you by your health care provider. Make sure you discuss any questions you have with your  health care provider. Document Revised: 02/06/2021 Document Reviewed: 02/04/2019 Elsevier Patient Education  [place].

## 2021-09-26 NOTE — Telephone Encounter (Signed)
PA done via [facility]: auth# [id number], DOS: Oct 01, 2021 - Dec 30, 2021

## 2021-09-27 ENCOUNTER — Encounter (HOSPITAL_COMMUNITY)
Admission: RE | Admit: 2021-09-27 | Discharge: 2021-09-27 | Disposition: A | Discharge status: Home / Self Care | Payer: Medicare Other | Source: Ambulatory Visit | Attending: Internal Medicine | Admitting: Internal Medicine

## 2021-09-27 ENCOUNTER — Encounter (HOSPITAL_COMMUNITY): Payer: Self-pay

## 2021-09-27 VITALS — BP 108/85 | HR 97 | Temp 97.5°F | Resp 18 | Ht 63.0 in | Wt 154.0 lb

## 2021-09-27 DIAGNOSIS — F1010 Alcohol abuse, uncomplicated: Secondary | ICD-10-CM

## 2021-09-27 DIAGNOSIS — Z01812 Encounter for preprocedural laboratory examination: Secondary | ICD-10-CM | POA: Diagnosis present

## 2021-09-27 DIAGNOSIS — F1411 Cocaine abuse, in remission: Secondary | ICD-10-CM

## 2021-09-27 DIAGNOSIS — D649 Anemia, unspecified: Secondary | ICD-10-CM

## 2021-09-27 DIAGNOSIS — F1410 Cocaine abuse, uncomplicated: Secondary | ICD-10-CM

## 2021-09-27 DIAGNOSIS — D62 Acute posthemorrhagic anemia: Secondary | ICD-10-CM | POA: Diagnosis not present

## 2021-09-27 DIAGNOSIS — K625 Hemorrhage of anus and rectum: Secondary | ICD-10-CM | POA: Diagnosis not present

## 2021-09-27 LAB — CBC WITH DIFFERENTIAL/PLATELET
Abs Immature Granulocytes: 0 10*3/uL (ref 0.00–0.07)
Basophils Absolute: 0 10*3/uL (ref 0.0–0.1)
Basophils Relative: 1 %
Eosinophils Absolute: 0.3 10*3/uL (ref 0.0–0.5)
Eosinophils Relative: 5 %
HCT: 35.6 % — ABNORMAL LOW (ref 36.0–46.0)
Hemoglobin: 12 g/dL (ref 12.0–15.0)
Immature Granulocytes: 0 %
Lymphocytes Relative: 52 %
Lymphs Abs: 2.6 10*3/uL (ref 0.7–4.0)
MCH: 24.7 pg — ABNORMAL LOW (ref 26.0–34.0)
MCHC: 33.7 g/dL (ref 30.0–36.0)
MCV: 73.4 fL — ABNORMAL LOW (ref 80.0–100.0)
Monocytes Absolute: 0.5 10*3/uL (ref 0.1–1.0)
Monocytes Relative: 9 %
Neutro Abs: 1.6 10*3/uL — ABNORMAL LOW (ref 1.7–7.7)
Neutrophils Relative %: 33 %
Platelets: 518 10*3/uL — ABNORMAL HIGH (ref 150–400)
RBC: 4.85 MIL/uL (ref 3.87–5.11)
RDW: 20.1 % — ABNORMAL HIGH (ref 11.5–15.5)
WBC: 4.9 10*3/uL (ref 4.0–10.5)
nRBC: 0 % (ref 0.0–0.2)

## 2021-09-27 LAB — COMPREHENSIVE METABOLIC PANEL
ALT: 17 U/L (ref 0–44)
AST: 20 U/L (ref 15–41)
Albumin: 3.7 g/dL (ref 3.5–5.0)
Alkaline Phosphatase: 65 U/L (ref 38–126)
Anion gap: 8 (ref 5–15)
BUN: 10 mg/dL (ref 6–20)
CO2: 23 mmol/L (ref 22–32)
Calcium: 9.1 mg/dL (ref 8.9–10.3)
Chloride: 108 mmol/L (ref 98–111)
Creatinine, Ser: 0.5 mg/dL (ref 0.44–1.00)
GFR, Estimated: >60 mL/min (ref >60)
Glucose, Bld: 88 mg/dL (ref 70–99)
Potassium: 3.9 mmol/L (ref 3.5–5.1)
Sodium: 139 mmol/L (ref 135–145)
Total Bilirubin: 0.6 mg/dL (ref 0.3–1.2)
Total Protein: 7.2 g/dL (ref 6.5–8.1)

## 2021-09-27 LAB — RAPID URINE DRUG SCREEN, HOSP PERFORMED
Amphetamines: NOT DETECTED
Barbiturates: NOT DETECTED
Benzodiazepines: NOT DETECTED
Cocaine: NOT DETECTED
Opiates: NOT DETECTED
Tetrahydrocannabinol: NOT DETECTED

## 2021-10-01 ENCOUNTER — Encounter (HOSPITAL_COMMUNITY)
Admission: RE | Disposition: A | Discharge status: Home / Self Care | Payer: Self-pay | Source: Home / Self Care | Attending: Internal Medicine

## 2021-10-01 ENCOUNTER — Encounter (HOSPITAL_COMMUNITY): Payer: Self-pay

## 2021-10-01 ENCOUNTER — Ambulatory Visit (HOSPITAL_COMMUNITY): Payer: Medicare Other | Admitting: Anesthesiology

## 2021-10-01 ENCOUNTER — Ambulatory Visit (HOSPITAL_BASED_OUTPATIENT_CLINIC_OR_DEPARTMENT_OTHER): Payer: Medicare Other | Admitting: Anesthesiology

## 2021-10-01 ENCOUNTER — Ambulatory Visit (HOSPITAL_COMMUNITY)
Admission: RE | Admit: 2021-10-01 | Discharge: 2021-10-01 | Disposition: A | Discharge status: Home / Self Care | Payer: Medicare Other | Attending: Internal Medicine | Admitting: Internal Medicine

## 2021-10-01 DIAGNOSIS — R6881 Early satiety: Secondary | ICD-10-CM | POA: Diagnosis not present

## 2021-10-01 DIAGNOSIS — K469 Unspecified abdominal hernia without obstruction or gangrene: Secondary | ICD-10-CM | POA: Diagnosis not present

## 2021-10-01 DIAGNOSIS — D649 Anemia, unspecified: Secondary | ICD-10-CM | POA: Diagnosis not present

## 2021-10-01 DIAGNOSIS — K219 Gastro-esophageal reflux disease without esophagitis: Secondary | ICD-10-CM | POA: Diagnosis not present

## 2021-10-01 DIAGNOSIS — K222 Esophageal obstruction: Secondary | ICD-10-CM | POA: Insufficient documentation

## 2021-10-01 DIAGNOSIS — F17210 Nicotine dependence, cigarettes, uncomplicated: Secondary | ICD-10-CM

## 2021-10-01 DIAGNOSIS — R1310 Dysphagia, unspecified: Secondary | ICD-10-CM | POA: Insufficient documentation

## 2021-10-01 DIAGNOSIS — K3184 Gastroparesis: Secondary | ICD-10-CM | POA: Diagnosis not present

## 2021-10-01 DIAGNOSIS — Z8719 Personal history of other diseases of the digestive system: Secondary | ICD-10-CM | POA: Diagnosis not present

## 2021-10-01 DIAGNOSIS — K5900 Constipation, unspecified: Secondary | ICD-10-CM | POA: Diagnosis not present

## 2021-10-01 DIAGNOSIS — K449 Diaphragmatic hernia without obstruction or gangrene: Secondary | ICD-10-CM | POA: Diagnosis not present

## 2021-10-01 DIAGNOSIS — I10 Essential (primary) hypertension: Secondary | ICD-10-CM | POA: Diagnosis not present

## 2021-10-01 DIAGNOSIS — I25119 Atherosclerotic heart disease of native coronary artery with unspecified angina pectoris: Secondary | ICD-10-CM

## 2021-10-01 DIAGNOSIS — R112 Nausea with vomiting, unspecified: Secondary | ICD-10-CM | POA: Insufficient documentation

## 2021-10-01 DIAGNOSIS — F319 Bipolar disorder, unspecified: Secondary | ICD-10-CM | POA: Diagnosis not present

## 2021-10-01 DIAGNOSIS — L930 Discoid lupus erythematosus: Secondary | ICD-10-CM | POA: Insufficient documentation

## 2021-10-01 DIAGNOSIS — D759 Disease of blood and blood-forming organs, unspecified: Secondary | ICD-10-CM | POA: Insufficient documentation

## 2021-10-01 DIAGNOSIS — Z79899 Other long term (current) drug therapy: Secondary | ICD-10-CM | POA: Diagnosis not present

## 2021-10-01 DIAGNOSIS — Z8711 Personal history of peptic ulcer disease: Secondary | ICD-10-CM | POA: Diagnosis not present

## 2021-10-01 DIAGNOSIS — Z9049 Acquired absence of other specified parts of digestive tract: Secondary | ICD-10-CM | POA: Insufficient documentation

## 2021-10-01 DIAGNOSIS — Z79891 Long term (current) use of opiate analgesic: Secondary | ICD-10-CM | POA: Diagnosis not present

## 2021-10-01 HISTORY — PX: BALLOON DILATION: SHX5330

## 2021-10-01 HISTORY — PX: ESOPHAGOGASTRODUODENOSCOPY (EGD) WITH PROPOFOL: SHX5813

## 2021-10-01 SURGERY — ESOPHAGOGASTRODUODENOSCOPY (EGD) WITH PROPOFOL
Anesthesia: General

## 2021-10-01 MED ORDER — LACTATED RINGERS IV SOLN
INTRAVENOUS | Status: DC
  Administered 2021-10-01: 1000 mL via INTRAVENOUS

## 2021-10-01 MED ORDER — PROPOFOL 10 MG/ML IV BOLUS
INTRAVENOUS | Status: DC | PRN
  Administered 2021-10-01: 50 mg via INTRAVENOUS
  Administered 2021-10-01: 100 mg via INTRAVENOUS
  Administered 2021-10-01 (×2): 50 mg via INTRAVENOUS

## 2021-10-01 MED ORDER — LIDOCAINE HCL (CARDIAC) PF 100 MG/5ML IV SOSY
PREFILLED_SYRINGE | INTRAVENOUS | Status: DC | PRN
  Administered 2021-10-01: 50 mg via INTRAVENOUS

## 2021-10-01 NOTE — Discharge Instructions (Addendum)
EGD Discharge instructions Please read the instructions outlined below and refer to this sheet in the next few weeks. These discharge instructions provide you with general information on caring for yourself after you leave the hospital. Your doctor may also give you specific instructions. While your treatment has been planned according to the most current medical practices available, unavoidable complications occasionally occur. If you have any problems or questions after discharge, please call your doctor. ACTIVITY You may resume your regular activity but move at a slower pace for the next 24 hours.  Take frequent rest periods for the next 24 hours.  Walking will help expel (get rid of) the air and reduce the bloated feeling in your abdomen.  No driving for 24 hours (because of the anesthesia (medicine) used during the test).  You may shower.  Do not sign any important legal documents or operate any machinery for 24 hours (because of the anesthesia used during the test).  NUTRITION Drink plenty of fluids.  You may resume your normal diet.  Begin with a light meal and progress to your normal diet.  Avoid alcoholic beverages for 24 hours or as instructed by your caregiver.  MEDICATIONS You may resume your normal medications unless your caregiver tells you otherwise.  WHAT YOU CAN EXPECT TODAY You may experience abdominal discomfort such as a feeling of fullness or "gas" pains.  FOLLOW-UP Your doctor will discuss the results of your test with you.  SEEK IMMEDIATE MEDICAL ATTENTION IF ANY OF THE FOLLOWING OCCUR: Excessive nausea (feeling sick to your stomach) and/or vomiting.  Severe abdominal pain and distention (swelling).  Trouble swallowing.  Temperature over 101 F (37.8 C).  Rectal bleeding or vomiting of blood.   You had a mild tightening of your esophagus which I stretched with a balloon today.  Hopefully this helps  with your swallowing.  Stomach and small bowel otherwise appeared  normal.  Continue current medications.  Follow-up with GI in 3 to 4 months.   I hope you have a great rest of your week!  Elon Alas. Abbey Chatters, D.O. Gastroenterology and Hepatology [facility]

## 2021-10-01 NOTE — Anesthesia Preprocedure Evaluation (Addendum)
Anesthesia Evaluation  Patient identified by MRN, date of birth, ID band Patient awake    Reviewed: Allergy & Precautions, NPO status , Patient's Chart, lab work & pertinent test results, reviewed documented beta blocker date and time   History of Anesthesia Complications (+) DIFFICULT IV STICK / SPECIAL LINE and history of anesthetic complications  Airway Mallampati: II  TM Distance: >3 FB Neck ROM: Full    Dental  (+) Dental Advisory Given, Chipped, Poor Dentition, Missing   Pulmonary shortness of breath and with exertion, Current Smoker and Patient abstained from smoking.,    Pulmonary exam normal breath sounds clear to auscultation       Cardiovascular hypertension, Pt. on medications and Pt. on home beta blockers + angina + CAD  Normal cardiovascular exam Rhythm:Regular Rate:Normal  Cath - 2020 reviewed, no interventions needed   Neuro/Psych  Headaches, PSYCHIATRIC DISORDERS Anxiety Depression Bipolar Disorder  Neuromuscular disease CVA, No Residual Symptoms    GI/Hepatic hiatal hernia, GERD (Gastroparesis)  Medicated and Controlled,(+)     substance abuse (last use of cocaine and other drugs - 4 years ago,, h/o alcohol abuse , not  drinking heavy anymore)  alcohol use,   Endo/Other  Hypothyroidism   Renal/GU negative Renal ROS     Musculoskeletal negative musculoskeletal ROS (+)   Abdominal   Peds  Hematology  (+) Blood dyscrasia, anemia ,   Anesthesia Other Findings Lupus   Reproductive/Obstetrics negative OB ROS                           Anesthesia Physical  Anesthesia Plan  ASA: 3  Anesthesia Plan: General   Post-op Pain Management: Minimal or no pain anticipated   Induction: Intravenous  PONV Risk Score and Plan: TIVA  Airway Management Planned: Nasal Cannula and Natural Airway  Additional Equipment:   Intra-op Plan:   Post-operative Plan:   Informed Consent: I  have reviewed the patients History and Physical, chart, labs and discussed the procedure including the risks, benefits and alternatives for the proposed anesthesia with the patient or authorized representative who has indicated his/her understanding and acceptance.       Plan Discussed with: CRNA and Surgeon  Anesthesia Plan Comments:         Anesthesia Quick Evaluation

## 2021-10-01 NOTE — Interval H&P Note (Signed)
History and Physical Interval Note:  10/01/2021 2:40 PM  Angel Price  has presented today for surgery, with the diagnosis of dysphagia, intractable nausea/vomiting.  The various methods of treatment have been discussed with the patient and family. After consideration of risks, benefits and other options for treatment, the patient has consented to  Procedure(s) with comments: ESOPHAGOGASTRODUODENOSCOPY (EGD) WITH PROPOFOL (N/A) - 3:00pm BALLOON DILATION (N/A) as a surgical intervention.  The patient's history has been reviewed, patient examined, no change in status, stable for surgery.  I have reviewed the patient's chart and labs.  Questions were answered to the patient's satisfaction.     Eloise Harman

## 2021-10-01 NOTE — Anesthesia Postprocedure Evaluation (Signed)
Anesthesia Post Note  Patient: Angel Price  Procedure(s) Performed: ESOPHAGOGASTRODUODENOSCOPY (EGD) WITH PROPOFOL BALLOON DILATION  Patient location during evaluation: Phase II Anesthesia Type: General Level of consciousness: awake and alert and oriented Pain management: pain level controlled Vital Signs Assessment: post-procedure vital signs reviewed and stable Respiratory status: spontaneous breathing, nonlabored ventilation and respiratory function stable Cardiovascular status: blood pressure returned to baseline and stable Postop Assessment: no apparent nausea or vomiting Anesthetic complications: no   No notable events documented.   Last Vitals:  Vitals:   10/01/21 1311 10/01/21 1457  BP: (!) 126/93 116/77  Pulse: 85 87  Resp: 17 (!) 21  Temp: 36.5 C (!) 36.4 C  SpO2: 100% 96%    Last Pain:  Vitals:   10/01/21 1457  TempSrc: Oral  PainSc: 0-No pain                 Taniah Reinecke C Coralynn Gaona

## 2021-10-01 NOTE — Op Note (Signed)
[facility] Patient Name: Angel Price Procedure Date: 10/01/2021 2:44 PM MRN: [id number] Date of Birth: 07-22-1965 Attending MD: Elon Alas. Abbey Chatters DO CSN: [id number] Age: 56 Admit Type: Outpatient Procedure:                Upper GI endoscopy Indications:              Dysphagia Providers:                Elon Alas. Abbey Chatters, DO, Lambert Mody, Everardo Pacific Referring MD:              Medicines:                See the Anesthesia note for documentation of the                            administered medications Complications:            No immediate complications. Estimated Blood Loss:     Estimated blood loss: none. Procedure:                Pre-Anesthesia Assessment:                           - The anesthesia plan was to use monitored                            anesthesia care (MAC).                           After obtaining informed consent, the endoscope was                            passed under direct vision. Throughout the                            procedure, the patient's blood pressure, pulse, and                            oxygen saturations were monitored continuously. The                            [id number]-H190 ([id number]) scope was introduced through the                            mouth, and advanced to the second part of duodenum.                            The upper GI endoscopy was accomplished without                            difficulty. The patient tolerated the procedure                            well. Scope In: 2:49:25 PM Scope Out: 2:54:05 PM Total  Procedure Duration: 0 hours 4 minutes 40 seconds  Findings:      A mild Schatzki ring was found at the gastroesophageal junction. A TTS       dilator was passed through the scope. Dilation with an 18-19-20 mm       balloon dilator was performed to 20 mm. The dilation site was examined       and showed moderate improvement in luminal narrowing.      A small amount of food  (residue) was found in the gastric fundus.      The duodenal bulb, first portion of the duodenum and second portion of       the duodenum were normal. Impression:               - Mild Schatzki ring. Dilated.                           - A small amount of food (residue) in the stomach.                           - Normal duodenal bulb, first portion of the                            duodenum and second portion of the duodenum.                           - No specimens collected. Moderate Sedation:      Per Anesthesia Care Recommendation:           - Patient has a contact number available for                            emergencies. The signs and symptoms of potential                            delayed complications were discussed with the                            patient. Return to normal activities tomorrow.                            Written discharge instructions were provided to the                            patient.                           - Resume previous diet.                           - Continue present medications.                           - Await pathology results.                           - Repeat upper endoscopy PRN for retreatment.                           -  Return to GI clinic in 4 months. Procedure Code(s):        --- Professional ---                           [phone], Esophagogastroduodenoscopy, flexible,                            transoral; with transendoscopic balloon dilation of                            esophagus (less than 30 mm diameter) Diagnosis Code(s):        --- Professional ---                           K22.2, Esophageal obstruction                           R13.10, Dysphagia, unspecified CPT copyright 2019 American Medical Association. All rights reserved. The codes documented in this report are preliminary and upon coder review may  be revised to meet current compliance requirements. Elon Alas. Abbey Chatters, DO [place] Abbey Chatters, DO 10/01/2021 2:56:12  PM This report has been signed electronically. Number of Addenda: 0

## 2021-10-01 NOTE — Transfer of Care (Signed)
Immediate Anesthesia Transfer of Care Note  Patient: Angel Price  Procedure(s) Performed: ESOPHAGOGASTRODUODENOSCOPY (EGD) WITH PROPOFOL BALLOON DILATION  Patient Location: Short Stay  Anesthesia Type:General  Level of Consciousness: awake  Airway & Oxygen Therapy: Patient Spontanous Breathing  Post-op Assessment: Report given to RN and Post -op Vital signs reviewed and stable  Post vital signs: Reviewed and stable  Last Vitals:  Vitals Value Taken Time  BP    Temp    Pulse    Resp    SpO2 96%     Last Pain:  Vitals:   10/01/21 1445  TempSrc:   PainSc: 0-No pain      Patients Stated Pain Goal: 7 (21/30/86 5784)  Complications: No notable events documented.

## 2021-10-04 ENCOUNTER — Encounter (HOSPITAL_COMMUNITY): Payer: Self-pay | Admitting: Internal Medicine

## 2021-10-05 ENCOUNTER — Emergency Department (HOSPITAL_COMMUNITY): Payer: Medicare Other

## 2021-10-05 ENCOUNTER — Other Ambulatory Visit: Payer: Self-pay

## 2021-10-05 ENCOUNTER — Encounter (HOSPITAL_COMMUNITY): Payer: Self-pay | Admitting: Emergency Medicine

## 2021-10-05 ENCOUNTER — Inpatient Hospital Stay (HOSPITAL_COMMUNITY)
Admission: EM | Admit: 2021-10-05 | Discharge: 2021-10-07 | DRG: 863 | Disposition: A | Discharge status: Home / Self Care | Payer: Medicare Other | Attending: Family Medicine | Admitting: Family Medicine

## 2021-10-05 DIAGNOSIS — I480 Paroxysmal atrial fibrillation: Secondary | ICD-10-CM

## 2021-10-05 DIAGNOSIS — R519 Headache, unspecified: Secondary | ICD-10-CM | POA: Diagnosis present

## 2021-10-05 DIAGNOSIS — L03311 Cellulitis of abdominal wall: Secondary | ICD-10-CM | POA: Diagnosis present

## 2021-10-05 DIAGNOSIS — T8149XA Infection following a procedure, other surgical site, initial encounter: Principal | ICD-10-CM

## 2021-10-05 DIAGNOSIS — K219 Gastro-esophageal reflux disease without esophagitis: Secondary | ICD-10-CM | POA: Diagnosis present

## 2021-10-05 DIAGNOSIS — Y838 Other surgical procedures as the cause of abnormal reaction of the patient, or of later complication, without mention of misadventure at the time of the procedure: Secondary | ICD-10-CM | POA: Diagnosis present

## 2021-10-05 DIAGNOSIS — F32A Depression, unspecified: Secondary | ICD-10-CM | POA: Diagnosis present

## 2021-10-05 DIAGNOSIS — E876 Hypokalemia: Secondary | ICD-10-CM | POA: Diagnosis present

## 2021-10-05 DIAGNOSIS — F17200 Nicotine dependence, unspecified, uncomplicated: Secondary | ICD-10-CM

## 2021-10-05 DIAGNOSIS — Z818 Family history of other mental and behavioral disorders: Secondary | ICD-10-CM

## 2021-10-05 DIAGNOSIS — Z885 Allergy status to narcotic agent status: Secondary | ICD-10-CM

## 2021-10-05 DIAGNOSIS — Z79899 Other long term (current) drug therapy: Secondary | ICD-10-CM

## 2021-10-05 DIAGNOSIS — Z8673 Personal history of transient ischemic attack (TIA), and cerebral infarction without residual deficits: Secondary | ICD-10-CM

## 2021-10-05 DIAGNOSIS — R109 Unspecified abdominal pain: Secondary | ICD-10-CM | POA: Diagnosis present

## 2021-10-05 DIAGNOSIS — Z9049 Acquired absence of other specified parts of digestive tract: Secondary | ICD-10-CM

## 2021-10-05 DIAGNOSIS — L02211 Cutaneous abscess of abdominal wall: Secondary | ICD-10-CM | POA: Diagnosis present

## 2021-10-05 DIAGNOSIS — F17210 Nicotine dependence, cigarettes, uncomplicated: Secondary | ICD-10-CM | POA: Diagnosis present

## 2021-10-05 DIAGNOSIS — K222 Esophageal obstruction: Secondary | ICD-10-CM | POA: Diagnosis present

## 2021-10-05 DIAGNOSIS — D573 Sickle-cell trait: Secondary | ICD-10-CM | POA: Diagnosis present

## 2021-10-05 DIAGNOSIS — R197 Diarrhea, unspecified: Secondary | ICD-10-CM | POA: Diagnosis present

## 2021-10-05 DIAGNOSIS — I10 Essential (primary) hypertension: Secondary | ICD-10-CM | POA: Diagnosis present

## 2021-10-05 DIAGNOSIS — Z7982 Long term (current) use of aspirin: Secondary | ICD-10-CM

## 2021-10-05 DIAGNOSIS — Z90710 Acquired absence of both cervix and uterus: Secondary | ICD-10-CM

## 2021-10-05 DIAGNOSIS — Z888 Allergy status to other drugs, medicaments and biological substances status: Secondary | ICD-10-CM

## 2021-10-05 DIAGNOSIS — R188 Other ascites: Secondary | ICD-10-CM | POA: Diagnosis present

## 2021-10-05 DIAGNOSIS — K3184 Gastroparesis: Secondary | ICD-10-CM | POA: Diagnosis present

## 2021-10-05 DIAGNOSIS — F419 Anxiety disorder, unspecified: Secondary | ICD-10-CM | POA: Diagnosis present

## 2021-10-05 DIAGNOSIS — L0390 Cellulitis, unspecified: Secondary | ICD-10-CM

## 2021-10-05 DIAGNOSIS — Z9151 Personal history of suicidal behavior: Secondary | ICD-10-CM

## 2021-10-05 DIAGNOSIS — E782 Mixed hyperlipidemia: Secondary | ICD-10-CM | POA: Diagnosis present

## 2021-10-05 DIAGNOSIS — Z86010 Personal history of colonic polyps: Secondary | ICD-10-CM

## 2021-10-05 LAB — COMPREHENSIVE METABOLIC PANEL
ALT: 16 U/L (ref 0–44)
AST: 23 U/L (ref 15–41)
Albumin: 4 g/dL (ref 3.5–5.0)
Alkaline Phosphatase: 72 U/L (ref 38–126)
Anion gap: 9 (ref 5–15)
BUN: 7 mg/dL (ref 6–20)
CO2: 21 mmol/L — ABNORMAL LOW (ref 22–32)
Calcium: 9 mg/dL (ref 8.9–10.3)
Chloride: 109 mmol/L (ref 98–111)
Creatinine, Ser: 0.76 mg/dL (ref 0.44–1.00)
GFR, Estimated: >60 mL/min (ref >60)
Glucose, Bld: 94 mg/dL (ref 70–99)
Potassium: 4.4 mmol/L (ref 3.5–5.1)
Sodium: 139 mmol/L (ref 135–145)
Total Bilirubin: 0.7 mg/dL (ref 0.3–1.2)
Total Protein: 7.7 g/dL (ref 6.5–8.1)

## 2021-10-05 LAB — CBC WITH DIFFERENTIAL/PLATELET
Abs Immature Granulocytes: 0.01 10*3/uL (ref 0.00–0.07)
Basophils Absolute: 0 10*3/uL (ref 0.0–0.1)
Basophils Relative: 1 %
Eosinophils Absolute: 0.2 10*3/uL (ref 0.0–0.5)
Eosinophils Relative: 4 %
HCT: 33.5 % — ABNORMAL LOW (ref 36.0–46.0)
Hemoglobin: 11.8 g/dL — ABNORMAL LOW (ref 12.0–15.0)
Immature Granulocytes: 0 %
Lymphocytes Relative: 58 %
Lymphs Abs: 3.4 10*3/uL (ref 0.7–4.0)
MCH: 25.4 pg — ABNORMAL LOW (ref 26.0–34.0)
MCHC: 35.2 g/dL (ref 30.0–36.0)
MCV: 72.2 fL — ABNORMAL LOW (ref 80.0–100.0)
Monocytes Absolute: 0.4 10*3/uL (ref 0.1–1.0)
Monocytes Relative: 7 %
Neutro Abs: 1.8 10*3/uL (ref 1.7–7.7)
Neutrophils Relative %: 30 %
Platelets: 337 10*3/uL (ref 150–400)
RBC: 4.64 MIL/uL (ref 3.87–5.11)
RDW: 20.5 % — ABNORMAL HIGH (ref 11.5–15.5)
WBC: 5.9 10*3/uL (ref 4.0–10.5)
nRBC: 0 % (ref 0.0–0.2)

## 2021-10-05 LAB — LIPASE, BLOOD: Lipase: 20 U/L (ref 11–51)

## 2021-10-05 MED ORDER — VANCOMYCIN HCL 1250 MG/250ML IV SOLN
1250.0000 mg | Freq: Once | INTRAVENOUS | Status: AC
  Administered 2021-10-06: 1250 mg via INTRAVENOUS
  Filled 2021-10-05: qty 250

## 2021-10-05 MED ORDER — ONDANSETRON HCL 4 MG/2ML IJ SOLN
4.0000 mg | Freq: Once | INTRAMUSCULAR | Status: AC
  Administered 2021-10-05: 4 mg via INTRAVENOUS
  Filled 2021-10-05: qty 2

## 2021-10-05 MED ORDER — IOHEXOL 300 MG/ML  SOLN
100.0000 mL | Freq: Once | INTRAMUSCULAR | Status: AC | PRN
  Administered 2021-10-05: 100 mL via INTRAVENOUS

## 2021-10-05 MED ORDER — VANCOMYCIN HCL IN DEXTROSE 1-5 GM/200ML-% IV SOLN: 1000.0000 mg | Freq: Once | INTRAVENOUS | Status: DC

## 2021-10-05 MED ORDER — VANCOMYCIN HCL 750 MG/150ML IV SOLN
750.0000 mg | Freq: Two times a day (BID) | INTRAVENOUS | Status: DC
  Administered 2021-10-06 – 2021-10-07 (×2): 750 mg via INTRAVENOUS
  Filled 2021-10-05 (×3): qty 150

## 2021-10-05 MED ORDER — FENTANYL CITRATE PF 50 MCG/ML IJ SOSY
50.0000 ug | PREFILLED_SYRINGE | Freq: Once | INTRAMUSCULAR | Status: AC
  Administered 2021-10-05: 50 ug via INTRAVENOUS
  Filled 2021-10-05: qty 1

## 2021-10-05 NOTE — Progress Notes (Signed)
Pharmacy Antibiotic Note  Angel Price is a 56 y.o. female admitted on 10/05/2021 with cellulitis.  Pharmacy has been consulted for vancomycin dosing.  Plan: Vancomycin '1250mg'$  x1 then '750mg'$  IV Q 12 hrs. Goal AUC 400-550.  Expected AUC 500.  SCr used 0.8 (actual 0.76).   Height: '5\' 3"'$  (160 cm) Weight: 69.4 kg (153 lb) IBW/kg (Calculated) : 52.4  Temp (24hrs), Avg:98 F (36.7 C), Min:97.9 F (36.6 C), Max:98.1 F (36.7 C)  Recent Labs  Lab 10/05/21 2110 10/05/21 2150  WBC  --  5.9  CREATININE 0.76  --     Estimated Creatinine Clearance: 75.1 mL/min (by C-G formula based on SCr of 0.76 mg/dL).    Allergies  Allergen Reactions   Metoclopramide Other (See Comments) and Anaphylaxis    EPS symptoms  Jerking movements   Codeine Hives   Reglan [Metoclopramide] Other (See Comments)    EPS symptoms     Thank you for allowing pharmacy to be a part of this patient's care.  Wynona Neat, PharmD, BCPS  10/05/2021 11:49 PM

## 2021-10-05 NOTE — ED Triage Notes (Signed)
Pt c/o abd pain with n/v/d. Pt had recent abd hernia repair.

## 2021-10-05 NOTE — ED Provider Notes (Signed)
[facility] EMERGENCY DEPARTMENT Provider Note   CSN: [id number] Arrival date & time: 10/05/21  2038     History  Chief Complaint  Patient presents with   Abdominal Pain    [facility] is a 56 y.o. female.   Abdominal Pain  This patient is a 56 year old female, she has a known history of hypertension on amlodipine and atenolol, [name], [name]  She has had multiple abdominal surgeries in the past reporting that she has had problems from stomach ulcers, blockages and other problems to which she cannot remember but has had multiple abdominal surgeries.  This most recent 1 seem to go well however over the months she has had a little bit of discomfort and over the last 3 days diffuse nausea vomiting and diarrhea.  The diarrhea is watery and nonbloody, multiple episodes of vomiting and worsening abdominal pain in the right mid abdomen.    Home Medications Prior to Admission medications   Medication Sig Start Date End Date Taking? Authorizing Provider  amLODipine (NORVASC) 10 MG tablet Take 10 mg by mouth daily. 05/18/21   [provider]  aspirin EC 81 MG EC tablet Take 1 tablet (81 mg total) by mouth daily. 05/19/19   Elgergawy, Silver Huguenin, MD  atenolol (TENORMIN) 50 MG tablet Take 50 mg by mouth daily. 05/18/21   [provider]  baclofen (LIORESAL) 10 MG tablet Take 10 mg by mouth 2 (two) times daily as needed for muscle spasms. 09/22/20   [provider]  diltiazem (CARDIZEM CD) 240 MG 24 hr capsule TAKE 1 CAPSULE(240 MG) BY MOUTH DAILY 01/22/21   Patwardhan, Manish J, MD  docusate sodium (COLACE) 100 MG  capsule Take 100 mg by mouth 2 (two) times daily.    [provider]  DULoxetine (CYMBALTA) 30 MG capsule Take 30 mg by mouth 2 (two) times daily. 08/14/21   [provider]  esomeprazole (NEXIUM) 40 MG capsule Take 1 capsule (40 mg total) by mouth 2 (two) times daily before a meal. Patient taking differently: Take 40 mg by mouth daily before breakfast. 09/14/21   Erenest Rasher, PA-C  hydroxychloroquine (PLAQUENIL) 200 MG tablet Take 1.5 tablets (300 mg total) by mouth daily. 03/08/21   Collier Salina, MD  isosorbide mononitrate (IMDUR) 60 MG 24 hr tablet TAKE 1/2 TABLET BY MOUTH DAILY 06/25/21   Patwardhan, Reynold Bowen, MD  lisinopril-hydrochlorothiazide (ZESTORETIC) 20-12.5 MG tablet Take 1 tablet by mouth every morning. 10/31/20   [provider]  methocarbamol (ROBAXIN) 500 MG tablet Take 500 mg by mouth 3 (three) times daily.    [provider]  mirtazapine (REMERON) 15 MG tablet Take 15 mg by mouth at bedtime as needed. 08/10/21   [provider]  nitroGLYCERIN (NITROSTAT) 0.4 MG SL tablet Place 1 tablet (0.4 mg total) under the tongue every 5 (five) minutes as needed for chest pain. 12/29/19 09/21/21  Patwardhan, Reynold Bowen, MD  oxyCODONE (OXY IR/ROXICODONE) 5 MG immediate release tablet Take 5 mg by mouth every 4 (four) hours as needed for pain. 09/11/21   [provider]  promethazine (PHENERGAN) 12.5 MG  tablet Take 12.5 mg by mouth every 6 (six) hours as needed for nausea or vomiting.  12/18/19   [provider]  Rimegepant Sulfate (NURTEC) 75 MG TBDP Take 75 mg by mouth as needed. 09/12/21   Genia Harold, MD  rosuvastatin (CRESTOR) 20 MG tablet Take 1 tablet (20 mg total) by mouth daily. 06/25/19 11/06/28  Patwardhan, Reynold Bowen, MD  thiamine 100 MG tablet Take 100 mg by mouth daily. 05/18/21   [provider]      Allergies    Metoclopramide, Codeine, and Reglan [metoclopramide]    Review of Price   Review of Price   Gastrointestinal:  Positive for abdominal pain.  All other Price reviewed and are negative.   Physical Exam Updated Vital Signs BP (!) 143/105 (BP Location: Right Arm)   Pulse 94   Temp 97.9 F (36.6 C) (Oral)   Resp 17   Ht 1.6 m ('5\' 3"'$ )   Wt 69.4 kg   LMP  (LMP Unknown)   SpO2 98%   BMI 27.10 kg/m  Physical Exam Vitals and nursing note reviewed.  Constitutional:      General: She is not in acute distress.    Appearance: She is well-developed.  HENT:     Head: Normocephalic and atraumatic.     Mouth/Throat:     Pharynx: No oropharyngeal exudate.  Eyes:     General: No scleral icterus.       Right eye: No discharge.        Left eye: No discharge.     Conjunctiva/sclera: Conjunctivae normal.     Pupils: Pupils are equal, round, and reactive to light.  Neck:     Thyroid: No thyromegaly.     Vascular: No JVD.  Cardiovascular:     Rate and Rhythm: Normal rate and regular rhythm.     Heart sounds: Normal heart sounds. No murmur heard.    No friction rub. No gallop.  Pulmonary:     Effort: Pulmonary effort is normal. No respiratory distress.     Breath sounds: Normal breath sounds. No wheezing or rales.  Abdominal:     General: Bowel sounds are normal. There is no distension.     Palpations: Abdomen is soft. There is no mass.     Tenderness: There is abdominal tenderness.  Musculoskeletal:        General: No tenderness. Normal range of motion.     Cervical back: Normal range of motion and neck supple.  Lymphadenopathy:     Cervical: No cervical adenopathy.  Skin:    General: Skin is warm and dry.     Findings: No erythema or rash.  Neurological:     Mental Status: She is alert.     Coordination: Coordination normal.  Psychiatric:        Behavior: Behavior normal.     ED Results / Procedures / Treatments   Labs (all labs ordered are listed, but only abnormal results are displayed) Labs Reviewed  COMPREHENSIVE METABOLIC PANEL - Abnormal; Notable for the  following components:      Result Value   CO2 21 (*)    All other components within normal limits  CBC WITH DIFFERENTIAL/PLATELET - Abnormal; Notable for the following components:   Hemoglobin 11.8 (*)    HCT 33.5 (*)    MCV 72.2 (*)    MCH 25.4 (*)    RDW 20.5 (*)    All other components within normal limits  LIPASE, BLOOD  CBC WITH DIFFERENTIAL/PLATELET  URINALYSIS, ROUTINE W REFLEX MICROSCOPIC    EKG None  Radiology CT ABDOMEN PELVIS W CONTRAST  Result Date: 10/05/2021 CLINICAL DATA:  Abdominal pain with nausea, vomiting and diarrhea. EXAM: CT ABDOMEN AND PELVIS WITH CONTRAST TECHNIQUE: Multidetector CT imaging of the abdomen and pelvis was performed using the standard protocol following bolus administration of intravenous contrast. RADIATION DOSE REDUCTION: This exam was performed according to the departmental dose-optimization program which includes automated exposure control, adjustment of the mA and/or kV according to patient size and/or use of iterative reconstruction technique. CONTRAST:  168m OMNIPAQUE IOHEXOL 300 MG/ML  SOLN COMPARISON:  Aug 15, 2020 FINDINGS: Lower chest: No acute abnormality. Hepatobiliary: There is diffuse fatty infiltration of the liver parenchyma. No focal liver abnormality is seen. Status post cholecystectomy. Common bile duct measures 8 mm in diameter. Pancreas: Unremarkable. No pancreatic ductal dilatation or surrounding inflammatory changes. Spleen: Normal in size without focal abnormality. Adrenals/Urinary Tract: A stable 1.8 cm x 1.8 cm low-attenuation left adrenal mass is seen. Multiple surgical clips are seen within the expected region of the right adrenal gland. Kidneys are normal in size, without renal calculi or focal lesions. A mildly prominent extrarenal pelvis is seen on the right. Bladder is unremarkable. Stomach/Bowel: Surgical sutures are seen within the gastric region. Surgical clips are seen along the gastroesophageal junction. Surgically  anastomosed bowel is seen along the anterior aspect of the mid to upper left abdomen and anterior aspect of the right lower quadrant. The appendix is not clearly identified. No evidence of bowel dilatation. Vascular/Lymphatic: Aortic atherosclerosis. No enlarged abdominal or pelvic lymph nodes. Reproductive: Status post hysterectomy. No adnexal masses. Other: A 6.1 cm by 1.3 cm collection of fluid is seen along the inferior aspect of the right rectus sheath (axial CT images 52 through 57, CT series 2). A mild-to-moderate amount of associated subcutaneous inflammatory fat stranding is noted. A ventral hernia is seen within this region on the prior study. A 3.6 cm x 1.7 cm fat containing ventral hernia is seen just above the level of the umbilicus, to the left of midline. Musculoskeletal: No acute or significant osseous findings. IMPRESSION: 1. 6.1 cm x 1.3 cm collection of fluid along the inferior aspect of the right rectus sheath which may represent an abscess versus postoperative seroma, with associated moderate severity cellulitis involving the adjacent portion of the abdominal wall. 2. Hepatic steatosis. 3. Stable left adrenal adenoma. 4. Extensive postoperative changes, as described above, with evidence of prior cholecystectomy and hysterectomy. 5. Aortic atherosclerosis. Aortic Atherosclerosis (ICD10-I70.0). Electronically Signed   By: TVirgina NorfolkM.D.   On: 10/05/2021 22:47    Procedures Procedures    Medications Ordered in ED Medications  vancomycin (VANCOCIN) IVPB 1000 mg/200 mL premix (has no administration in time range)  ondansetron (ZOFRAN) injection 4 mg (4 mg Intravenous Given 10/05/21 2203)  fentaNYL (SUBLIMAZE) injection 50 mcg (50 mcg Intravenous Given 10/05/21 2202)  iohexol (OMNIPAQUE) 300 MG/ML solution 100 mL (100 mLs Intravenous Contrast Given 10/05/21 2221)    ED Course/ Medical Decision Making/ A&P                           Medical Decision Making Amount and/or Complexity  of Data Reviewed Labs: ordered. Radiology: ordered.  Risk Prescription drug management. Decision regarding hospitalization.   This patient presents to the ED for concern of abdominal pain nausea vomiting and diarrhea, this involves an extensive number of treatment options, and is a complaint that carries  with it a high risk of complications and morbidity.  The differential diagnosis includes recurrent internal hernia, bowel obstruction, gastroenteritis or other potential abdominal problem such as appendicitis cholecystitis or hepatitis or transaminitis   Co morbidities that complicate the patient evaluation  Multiple prior abdominal surgeries   Additional history obtained:  Additional history obtained from electronic medical record External records from outside source obtained and reviewed including recent endoscopy, dilation of a Schatzki's ring   Lab Tests:  I Ordered, and personally interpreted labs.  The pertinent results include: CBC metabolic panel lipase and urinalysis, they show no leukocytosis, no significant electrolyte abnormalities   Imaging Studies ordered:  I ordered imaging studies including CT scan of the abdomen and pelvis I independently visualized and interpreted imaging which showed postoperative abscess approximately 6 cm x 1.3 cm with associated abdominal wall cellulitis I agree with the radiologist interpretation   Cardiac Monitoring: / EKG:  The patient was maintained on a cardiac monitor.  I personally viewed and interpreted the cardiac monitored which showed an underlying rhythm of: Normal sinus rhythm   Consultations Obtained:  I requested consultation with the general surgeon Dr. Arnoldo Morale,  and discussed lab and imaging findings as well as pertinent plan - they recommend: Admission to the hospital to the hospitalist service and he will see in consultation in the morning, recommends against any emergent surgical interventions to this   Problem  List / ED Course / Critical interventions / Medication management  Postoperative findings consistent with possible infection I ordered medication including vancomycin, pain medicine, nausea medicine and IV fluid for postop infection Reevaluation of the patient after these medicines showed that the patient stable I have reviewed the patients home medicines and have made adjustments as needed   Social Determinants of Health:  Multiple chronic abdominal surgeries   Test / Admission - Considered:  Considered admission and will proceed with admission to hospitalist service         Final Clinical Impression(s) / ED Diagnoses Final diagnoses:  Post-operative wound abscess     Noemi Chapel, MD 10/05/21 2303

## 2021-10-06 ENCOUNTER — Encounter (HOSPITAL_COMMUNITY): Payer: Self-pay | Admitting: Family Medicine

## 2021-10-06 DIAGNOSIS — R188 Other ascites: Secondary | ICD-10-CM | POA: Diagnosis present

## 2021-10-06 DIAGNOSIS — F32A Depression, unspecified: Secondary | ICD-10-CM | POA: Diagnosis present

## 2021-10-06 DIAGNOSIS — R109 Unspecified abdominal pain: Secondary | ICD-10-CM

## 2021-10-06 DIAGNOSIS — I480 Paroxysmal atrial fibrillation: Secondary | ICD-10-CM

## 2021-10-06 DIAGNOSIS — Z90710 Acquired absence of both cervix and uterus: Secondary | ICD-10-CM | POA: Diagnosis not present

## 2021-10-06 DIAGNOSIS — K222 Esophageal obstruction: Secondary | ICD-10-CM | POA: Diagnosis present

## 2021-10-06 DIAGNOSIS — I10 Essential (primary) hypertension: Secondary | ICD-10-CM

## 2021-10-06 DIAGNOSIS — Z888 Allergy status to other drugs, medicaments and biological substances status: Secondary | ICD-10-CM | POA: Diagnosis not present

## 2021-10-06 DIAGNOSIS — Z9151 Personal history of suicidal behavior: Secondary | ICD-10-CM | POA: Diagnosis not present

## 2021-10-06 DIAGNOSIS — Z8673 Personal history of transient ischemic attack (TIA), and cerebral infarction without residual deficits: Secondary | ICD-10-CM | POA: Diagnosis not present

## 2021-10-06 DIAGNOSIS — Z7982 Long term (current) use of aspirin: Secondary | ICD-10-CM | POA: Diagnosis not present

## 2021-10-06 DIAGNOSIS — Z885 Allergy status to narcotic agent status: Secondary | ICD-10-CM | POA: Diagnosis not present

## 2021-10-06 DIAGNOSIS — T8149XA Infection following a procedure, other surgical site, initial encounter: Secondary | ICD-10-CM | POA: Diagnosis not present

## 2021-10-06 DIAGNOSIS — K219 Gastro-esophageal reflux disease without esophagitis: Secondary | ICD-10-CM | POA: Diagnosis present

## 2021-10-06 DIAGNOSIS — L03311 Cellulitis of abdominal wall: Secondary | ICD-10-CM | POA: Diagnosis present

## 2021-10-06 DIAGNOSIS — F419 Anxiety disorder, unspecified: Secondary | ICD-10-CM | POA: Diagnosis present

## 2021-10-06 DIAGNOSIS — E782 Mixed hyperlipidemia: Secondary | ICD-10-CM

## 2021-10-06 DIAGNOSIS — E876 Hypokalemia: Secondary | ICD-10-CM | POA: Diagnosis present

## 2021-10-06 DIAGNOSIS — L02211 Cutaneous abscess of abdominal wall: Secondary | ICD-10-CM | POA: Diagnosis present

## 2021-10-06 DIAGNOSIS — K3184 Gastroparesis: Secondary | ICD-10-CM | POA: Diagnosis present

## 2021-10-06 DIAGNOSIS — Y838 Other surgical procedures as the cause of abnormal reaction of the patient, or of later complication, without mention of misadventure at the time of the procedure: Secondary | ICD-10-CM | POA: Diagnosis present

## 2021-10-06 DIAGNOSIS — R197 Diarrhea, unspecified: Secondary | ICD-10-CM | POA: Diagnosis present

## 2021-10-06 DIAGNOSIS — L0390 Cellulitis, unspecified: Secondary | ICD-10-CM

## 2021-10-06 DIAGNOSIS — D573 Sickle-cell trait: Secondary | ICD-10-CM | POA: Diagnosis present

## 2021-10-06 DIAGNOSIS — R519 Headache, unspecified: Secondary | ICD-10-CM | POA: Diagnosis present

## 2021-10-06 DIAGNOSIS — Z79899 Other long term (current) drug therapy: Secondary | ICD-10-CM | POA: Diagnosis not present

## 2021-10-06 DIAGNOSIS — F17200 Nicotine dependence, unspecified, uncomplicated: Secondary | ICD-10-CM

## 2021-10-06 DIAGNOSIS — Z86010 Personal history of colonic polyps: Secondary | ICD-10-CM | POA: Diagnosis not present

## 2021-10-06 DIAGNOSIS — F17210 Nicotine dependence, cigarettes, uncomplicated: Secondary | ICD-10-CM | POA: Diagnosis present

## 2021-10-06 LAB — COMPREHENSIVE METABOLIC PANEL
ALT: 23 U/L (ref 0–44)
AST: 95 U/L — ABNORMAL HIGH (ref 15–41)
Albumin: 3.7 g/dL (ref 3.5–5.0)
Alkaline Phosphatase: 69 U/L (ref 38–126)
Anion gap: 9 (ref 5–15)
BUN: 7 mg/dL (ref 6–20)
CO2: 20 mmol/L — ABNORMAL LOW (ref 22–32)
Calcium: 8.8 mg/dL — ABNORMAL LOW (ref 8.9–10.3)
Chloride: 111 mmol/L (ref 98–111)
Creatinine, Ser: 0.65 mg/dL (ref 0.44–1.00)
GFR, Estimated: >60 mL/min (ref >60)
Glucose, Bld: 97 mg/dL (ref 70–99)
Potassium: 3.4 mmol/L — ABNORMAL LOW (ref 3.5–5.1)
Sodium: 140 mmol/L (ref 135–145)
Total Bilirubin: 0.8 mg/dL (ref 0.3–1.2)
Total Protein: 7 g/dL (ref 6.5–8.1)

## 2021-10-06 LAB — CBC WITH DIFFERENTIAL/PLATELET
Abs Immature Granulocytes: 0.01 10*3/uL (ref 0.00–0.07)
Basophils Absolute: 0 10*3/uL (ref 0.0–0.1)
Basophils Relative: 1 %
Eosinophils Absolute: 0.3 10*3/uL (ref 0.0–0.5)
Eosinophils Relative: 4 %
HCT: 32.8 % — ABNORMAL LOW (ref 36.0–46.0)
Hemoglobin: 11.3 g/dL — ABNORMAL LOW (ref 12.0–15.0)
Immature Granulocytes: 0 %
Lymphocytes Relative: 50 %
Lymphs Abs: 3.8 10*3/uL (ref 0.7–4.0)
MCH: 25.5 pg — ABNORMAL LOW (ref 26.0–34.0)
MCHC: 34.5 g/dL (ref 30.0–36.0)
MCV: 74 fL — ABNORMAL LOW (ref 80.0–100.0)
Monocytes Absolute: 0.6 10*3/uL (ref 0.1–1.0)
Monocytes Relative: 8 %
Neutro Abs: 2.8 10*3/uL (ref 1.7–7.7)
Neutrophils Relative %: 37 %
Platelets: 348 10*3/uL (ref 150–400)
RBC: 4.43 MIL/uL (ref 3.87–5.11)
RDW: 20.4 % — ABNORMAL HIGH (ref 11.5–15.5)
WBC: 7.5 10*3/uL (ref 4.0–10.5)
nRBC: 0 % (ref 0.0–0.2)

## 2021-10-06 LAB — URINALYSIS, ROUTINE W REFLEX MICROSCOPIC
Bilirubin Urine: NEGATIVE
Glucose, UA: NEGATIVE mg/dL
Hgb urine dipstick: NEGATIVE
Ketones, ur: NEGATIVE mg/dL
Leukocytes,Ua: NEGATIVE
Nitrite: NEGATIVE
Protein, ur: NEGATIVE mg/dL
Specific Gravity, Urine: >1.046 — ABNORMAL HIGH (ref 1.005–1.030)
pH: 6 (ref 5.0–8.0)

## 2021-10-06 LAB — PROCALCITONIN: Procalcitonin: <0.1 ng/mL

## 2021-10-06 LAB — MRSA NEXT GEN BY PCR, NASAL: MRSA by PCR Next Gen: NOT DETECTED

## 2021-10-06 LAB — HIV ANTIBODY (ROUTINE TESTING W REFLEX): HIV Screen 4th Generation wRfx: NONREACTIVE

## 2021-10-06 LAB — MAGNESIUM: Magnesium: 2 mg/dL (ref 1.7–2.4)

## 2021-10-06 MED ORDER — ONDANSETRON HCL 4 MG/2ML IJ SOLN
4.0000 mg | Freq: Four times a day (QID) | INTRAMUSCULAR | Status: DC | PRN
  Administered 2021-10-06 (×2): 4 mg via INTRAVENOUS
  Filled 2021-10-06 (×2): qty 2

## 2021-10-06 MED ORDER — HYDRALAZINE HCL 20 MG/ML IJ SOLN
10.0000 mg | INTRAMUSCULAR | Status: DC | PRN
Start: 2021-10-06 — End: 2021-10-07

## 2021-10-06 MED ORDER — ACETAMINOPHEN 325 MG PO TABS: 650.0000 mg | ORAL_TABLET | Freq: Four times a day (QID) | ORAL | Status: DC | PRN

## 2021-10-06 MED ORDER — PIPERACILLIN-TAZOBACTAM 3.375 G IVPB
3.3750 g | Freq: Three times a day (TID) | INTRAVENOUS | Status: DC
  Administered 2021-10-06 – 2021-10-07 (×4): 3.375 g via INTRAVENOUS
  Filled 2021-10-06 (×5): qty 50

## 2021-10-06 MED ORDER — LISINOPRIL 10 MG PO TABS
20.0000 mg | ORAL_TABLET | Freq: Every day | ORAL | Status: DC
  Administered 2021-10-06 – 2021-10-07 (×2): 20 mg via ORAL
  Filled 2021-10-06 (×2): qty 2

## 2021-10-06 MED ORDER — CALCIUM GLUCONATE-NACL 1-0.675 GM/50ML-% IV SOLN
1.0000 g | Freq: Once | INTRAVENOUS | Status: AC
Start: 2021-10-06 — End: 2021-10-06
  Administered 2021-10-06: 1000 mg via INTRAVENOUS
  Filled 2021-10-06: qty 50

## 2021-10-06 MED ORDER — ROSUVASTATIN CALCIUM 20 MG PO TABS
20.0000 mg | ORAL_TABLET | Freq: Every day | ORAL | Status: DC
  Administered 2021-10-06 – 2021-10-07 (×2): 20 mg via ORAL
  Filled 2021-10-06 (×2): qty 1

## 2021-10-06 MED ORDER — THIAMINE HCL 100 MG PO TABS
100.0000 mg | ORAL_TABLET | Freq: Every day | ORAL | Status: DC
  Administered 2021-10-06 – 2021-10-07 (×2): 100 mg via ORAL
  Filled 2021-10-06 (×5): qty 1

## 2021-10-06 MED ORDER — MORPHINE SULFATE (PF) 2 MG/ML IV SOLN
2.0000 mg | INTRAVENOUS | Status: DC | PRN
  Administered 2021-10-06 – 2021-10-07 (×13): 2 mg via INTRAVENOUS
  Filled 2021-10-06 (×13): qty 1

## 2021-10-06 MED ORDER — DULOXETINE HCL 30 MG PO CPEP
30.0000 mg | ORAL_CAPSULE | Freq: Two times a day (BID) | ORAL | Status: DC
  Administered 2021-10-06 – 2021-10-07 (×4): 30 mg via ORAL
  Filled 2021-10-06 (×4): qty 1

## 2021-10-06 MED ORDER — ISOSORBIDE MONONITRATE ER 60 MG PO TB24
30.0000 mg | ORAL_TABLET | Freq: Every day | ORAL | Status: DC
  Administered 2021-10-06 – 2021-10-07 (×2): 30 mg via ORAL
  Filled 2021-10-06 (×2): qty 1

## 2021-10-06 MED ORDER — ATENOLOL 25 MG PO TABS
50.0000 mg | ORAL_TABLET | Freq: Every day | ORAL | Status: DC
  Administered 2021-10-06 – 2021-10-07 (×2): 50 mg via ORAL
  Filled 2021-10-06 (×2): qty 2

## 2021-10-06 MED ORDER — ASPIRIN 81 MG PO TBEC
81.0000 mg | DELAYED_RELEASE_TABLET | Freq: Every day | ORAL | Status: DC
  Administered 2021-10-06 – 2021-10-07 (×2): 81 mg via ORAL
  Filled 2021-10-06 (×2): qty 1

## 2021-10-06 MED ORDER — DILTIAZEM HCL ER COATED BEADS 240 MG PO CP24: 240.0000 mg | ORAL_CAPSULE | Freq: Every day | ORAL | Status: DC

## 2021-10-06 MED ORDER — AMLODIPINE BESYLATE 5 MG PO TABS
10.0000 mg | ORAL_TABLET | Freq: Every day | ORAL | Status: DC
  Administered 2021-10-06 – 2021-10-07 (×2): 10 mg via ORAL
  Filled 2021-10-06 (×2): qty 2

## 2021-10-06 MED ORDER — OXYCODONE HCL 5 MG PO TABS
5.0000 mg | ORAL_TABLET | ORAL | Status: DC | PRN
  Administered 2021-10-07: 5 mg via ORAL
  Filled 2021-10-06: qty 1

## 2021-10-06 MED ORDER — HYDROCHLOROTHIAZIDE 12.5 MG PO TABS: 12.5000 mg | ORAL_TABLET | Freq: Every day | ORAL | Status: DC

## 2021-10-06 MED ORDER — LISINOPRIL 10 MG PO TABS: 20.0000 mg | ORAL_TABLET | Freq: Every day | ORAL | Status: DC

## 2021-10-06 MED ORDER — POTASSIUM CHLORIDE 2 MEQ/ML IV SOLN
INTRAVENOUS | Status: DC
  Filled 2021-10-06 (×2): qty 1000

## 2021-10-06 MED ORDER — MIRTAZAPINE 15 MG PO TABS
15.0000 mg | ORAL_TABLET | Freq: Every day | ORAL | Status: DC
  Administered 2021-10-06 (×2): 15 mg via ORAL
  Filled 2021-10-06 (×2): qty 1

## 2021-10-06 MED ORDER — LISINOPRIL-HYDROCHLOROTHIAZIDE 20-12.5 MG PO TABS: 1.0000 | ORAL_TABLET | Freq: Every morning | ORAL | Status: DC

## 2021-10-06 MED ORDER — ONDANSETRON HCL 4 MG PO TABS: 4.0000 mg | ORAL_TABLET | Freq: Four times a day (QID) | ORAL | Status: DC | PRN

## 2021-10-06 MED ORDER — SODIUM CHLORIDE 0.9 % IV SOLN
INTRAVENOUS | Status: DC | PRN
  Administered 2021-10-06: 10 mL/h via INTRAVENOUS

## 2021-10-06 MED ORDER — ACETAMINOPHEN 650 MG RE SUPP: 650.0000 mg | Freq: Four times a day (QID) | RECTAL | Status: DC | PRN

## 2021-10-06 MED ORDER — PROMETHAZINE HCL 12.5 MG PO TABS
25.0000 mg | ORAL_TABLET | Freq: Four times a day (QID) | ORAL | Status: DC | PRN
  Administered 2021-10-06 – 2021-10-07 (×4): 25 mg via ORAL
  Filled 2021-10-06 (×4): qty 2

## 2021-10-06 MED ORDER — HYDROCHLOROTHIAZIDE 12.5 MG PO TABS
12.5000 mg | ORAL_TABLET | Freq: Every day | ORAL | Status: DC
  Administered 2021-10-07: 12.5 mg via ORAL
  Filled 2021-10-06: qty 1

## 2021-10-06 MED ORDER — HEPARIN SODIUM (PORCINE) 5000 UNIT/ML IJ SOLN
5000.0000 [IU] | Freq: Three times a day (TID) | INTRAMUSCULAR | Status: DC
Start: 2021-10-06 — End: 2021-10-07
  Administered 2021-10-06 – 2021-10-07 (×4): 5000 [IU] via SUBCUTANEOUS
  Filled 2021-10-06 (×4): qty 1

## 2021-10-06 NOTE — Consult Note (Addendum)
Reason for Consult: Postoperative fluid collection Referring Physician: Dr. Nat Math is an 56 y.o. female.  HPI: Patient is a 56 year old black female with a very complicated medical and surgical history who underwent a ventral herniorrhaphy with possible mesh in [place] on 09/05/2021.  She had seen her surgeon postoperatively and he had removed the staples.  She started having fever, chills, and decreased appetite.  She called their office but decided to come to any [facility] emergency room.  The CT scan of the abdomen pelvis revealed a 1.3 x 6 cm fluid collection in the rectus sheath at the incision site.  Reportedly the incision was slightly erythematous.  The patient was admitted to the hospital for IV antibiotics and control of her pain and nausea.  This morning, she states she still has some incisional pain but has never had any drainage.  Past Medical History:  Diagnosis Date   Abscess    soft tissue   Adrenal mass (HCC)    Alcohol abuse    Anemia    Anxiety    Blood transfusion without reported diagnosis    Chronic abdominal pain    Chronic wound infection of abdomen    Colon polyp    colonoscopy 04/2014   Depression    Discoid lupus erythematosus    Diverticulosis    colonoscopy 04/2014 moderat pan colonic   Drug-seeking behavior    Gastritis    EGD 05/2014   Gastroparesis 01/2014   GERD (gastroesophageal reflux disease)    Hemorrhoid    internal large   Hiatal hernia    History of Billroth II operation    Hypertension    Lung nodule    CT 02/2014 needs repeat 1 month   Lung nodule < 6cm on CT 04/25/2014   Lupus (HCC)    Malingering    Migraine    Nausea and vomiting    chronic, recurrent   Pancreatitis    Schatzki's ring    patent per EGD 04/2014   Sickle cell trait ([place])    Stroke ([place])    Suicide attempt ([place])    Thyroid disease 2000   overactive, radiation    Past Surgical History:  Procedure Laterality Date   ABDOMINAL HYSTERECTOMY   2013   [place]   ABDOMINAL HYSTERECTOMY     ABDOMINAL SURGERY     ADRENALECTOMY Right    AGILE CAPSULE N/A 01/05/2015   Procedure: AGILE CAPSULE;  Surgeon: Daneil Dolin, MD;  [facility];  Service: Endoscopy;  Laterality: N/A;  0700   BALLOON DILATION N/A 10/01/2021   Procedure: BALLOON DILATION;  Surgeon: Eloise Harman, DO;  [facility];  Service: Endoscopy;  Laterality: N/A;   Billroth II procedure      [place], first 2000, 2005/2006.   bilroth 2     BIOPSY  05/20/2013   Procedure: BIOPSIES OF ASCENDING AND SIGMOID COLON;  Surgeon: Daneil Dolin, MD;  Location: [facility];  Service: Endoscopy;;   BIOPSY  04/26/2014   Procedure: BIOPSIES;  Surgeon: Danie Binder, MD;  Location: [facility];  Service: Endoscopy;;   BIOPSY  12/24/2018   Procedure: BIOPSY;  Surgeon: Daneil Dolin, MD;  [facility];  Service: Endoscopy;;  colon   CHOLECYSTECTOMY     COLONOSCOPY     in [place]   COLONOSCOPY WITH PROPOFOL N/A 05/20/2013   Dr.Rourk- inadequate prep, normal appearing rectum, grossly normal colon aside from pancolonic diverticula, normal terminal ileum  bx= unremarkable colonic mucosa. Due for early interval 2016.    COLONOSCOPY WITH PROPOFOL N/A 04/26/2014   FBP:ZWCHEN ileum/one colon polyp removed/moderate pan-colonic diverticulosis/large internal hemorrhoids   COLONOSCOPY WITH PROPOFOL N/A 12/23/2014   Dr.Rourk- minimal internal hemorrhoids, pancolonic diverticulosis   COLONOSCOPY WITH PROPOFOL N/A 08/23/2016   Procedure: COLONOSCOPY WITH PROPOFOL;  Surgeon: Danie Binder, MD;  [facility];  Service: Endoscopy;  Laterality: N/A;   COLONOSCOPY WITH PROPOFOL N/A 12/24/2018   Pancolonic diverticulosis, normal TI, one 5 mm polyp in rectum (tubular adenoma). Somewhat friable hemorrhagic mucosa in area of IC valve/cecum, possibly related to scope trauma s/p biopsy. Repeat in 7 years.   DEBRIDEMENT OF ABDOMINAL WALL ABSCESS N/A 02/08/2013    Procedure: DEBRIDEMENT OF ABDOMINAL WALL ABSCESS;  Surgeon: Jamesetta So, MD;  Location: [facility];  Service: General;  Laterality: N/A;   ESOPHAGOGASTRODUODENOSCOPY (EGD) WITH PROPOFOL N/A 05/20/2013   Dr.Rourk- s/p prior gastric surgery with normal esophagus, residual gastric mucosa and patent efferent limb   ESOPHAGOGASTRODUODENOSCOPY (EGD) WITH PROPOFOL N/A 02/03/2014   Dr. Gala Romney:  s/p hemigastrectomy with retained gastric contents. Residual gastric mucosa and efferent limb appeared normal otherwise. Query gastroparesis.    ESOPHAGOGASTRODUODENOSCOPY (EGD) WITH PROPOFOL N/A 04/26/2014   IDP:OEUMPNTI'R ring/small HH/mild non-erosive gasrtitis/normal anastomosis   ESOPHAGOGASTRODUODENOSCOPY (EGD) WITH PROPOFOL N/A 12/23/2014   Dr.Rourk- s/p prior hemigastrctomy, active oozing from anastomotic suture site, hemostasis achieved   ESOPHAGOGASTRODUODENOSCOPY (EGD) WITH PROPOFOL N/A 05/23/2016   Dr. 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    ESOPHAGOGASTRODUODENOSCOPY (EGD) WITH PROPOFOL N/A 05/11/2017   Procedure: ESOPHAGOGASTRODUODENOSCOPY (EGD) WITH PROPOFOL;  Surgeon: Daneil Dolin, MD;  [facility];  Service: Endoscopy;  Laterality: N/A;   ESOPHAGOGASTRODUODENOSCOPY (EGD) WITH PROPOFOL N/A 06/07/2021   Procedure: ESOPHAGOGASTRODUODENOSCOPY (EGD) WITH PROPOFOL;  Surgeon: Daneil Dolin, MD;  [facility];  Service: Endoscopy;  Laterality: N/A;  11:45am   ESOPHAGOGASTRODUODENOSCOPY (EGD) WITH PROPOFOL N/A 10/01/2021   Procedure: ESOPHAGOGASTRODUODENOSCOPY (EGD) WITH PROPOFOL;  Surgeon: Eloise Harman, DO;  [facility];  Service: Endoscopy;  Laterality: N/A;  3:00pm   EXCISION OF MESH  08/2019   HEMORRHOID SURGERY N/A 06/18/2017   Procedure: THREE COLUMN EXTENSIVE HEMORRHOIDECTOMY;  Surgeon: Virl Cagey, MD;  Location: [facility];  Service: General;  Laterality: N/A;    INCISION AND DRAINAGE ABSCESS N/A 01/06/2017   Procedure: INCISION AND DRAINAGE ABDOMINAL WALL ABSCESS;  Surgeon: Aviva Signs, MD;  Location: [facility];  Service: General;  Laterality: N/A;   incisional hernial repair  09/05/2021   [place]   LEFT HEART CATH AND CORONARY ANGIOGRAPHY N/A 05/09/2020   Procedure: LEFT HEART CATH AND CORONARY ANGIOGRAPHY;  Surgeon: Nigel Mormon, MD;  Location: [facility];  Service: Cardiovascular;  Laterality: N/A;   POLYPECTOMY  12/24/2018   Procedure: POLYPECTOMY;  Surgeon: Daneil Dolin, MD;  [facility];  Service: Endoscopy;;  colon   tendon repar Right    wrist   WOUND EXPLORATION Right 06/24/2013   Procedure: exploration of traumatic wound right wrist;  Surgeon: Tennis Must, MD;  Location: [place];  Service: Orthopedics;  Laterality: Right;    Family History  Problem Relation Age of Onset   Drug abuse Mother    Lung cancer Father    Cancer Father        mets   Drug abuse Sister    Drug abuse Brother  Breast cancer Maternal Aunt    Bipolar disorder Maternal Aunt    Drug abuse Maternal Aunt    Colon cancer Maternal Grandmother        late 45s, early 85s   Bipolar disorder Paternal Grandfather    Brain cancer Son    Schizophrenia Son    Cancer Son        brain   Bipolar disorder Cousin    Liver disease Neg Hx     Social History:  reports that she has been smoking cigarettes. She has a 17.50 pack-year smoking history. She has never used smokeless tobacco. She reports current alcohol use. She reports that she does not currently use drugs after having used the following drugs: Cocaine.  Allergies:  Allergies  Allergen Reactions   Metoclopramide Other (See Comments) and Anaphylaxis    EPS symptoms  Jerking movements   Codeine Hives   Reglan [Metoclopramide] Other (See Comments)    EPS symptoms     Medications: I have reviewed the patient's current medications. Prior to Admission:  Medications Prior to  Admission  Medication Sig Dispense Refill Last Dose   amLODipine (NORVASC) 10 MG tablet Take 10 mg by mouth daily.      aspirin EC 81 MG EC tablet Take 1 tablet (81 mg total) by mouth daily. 30 tablet 0    atenolol (TENORMIN) 50 MG tablet Take 50 mg by mouth daily.      baclofen (LIORESAL) 10 MG tablet Take 10 mg by mouth 2 (two) times daily as needed for muscle spasms.      diltiazem (CARDIZEM CD) 240 MG 24 hr capsule TAKE 1 CAPSULE(240 MG) BY MOUTH DAILY 30 capsule 3    docusate sodium (COLACE) 100 MG capsule Take 100 mg by mouth 2 (two) times daily.      DULoxetine (CYMBALTA) 30 MG capsule Take 30 mg by mouth 2 (two) times daily.      esomeprazole (NEXIUM) 40 MG capsule Take 1 capsule (40 mg total) by mouth 2 (two) times daily before a meal. (Patient taking differently: Take 40 mg by mouth daily before breakfast.) 60 capsule 3    hydroxychloroquine (PLAQUENIL) 200 MG tablet Take 1.5 tablets (300 mg total) by mouth daily. 135 tablet 1    isosorbide mononitrate (IMDUR) 60 MG 24 hr tablet TAKE 1/2 TABLET BY MOUTH DAILY 90 tablet 3    lisinopril-hydrochlorothiazide (ZESTORETIC) 20-12.5 MG tablet Take 1 tablet by mouth every morning.      methocarbamol (ROBAXIN) 500 MG tablet Take 500 mg by mouth 3 (three) times daily.      mirtazapine (REMERON) 15 MG tablet Take 15 mg by mouth at bedtime as needed.      nitroGLYCERIN (NITROSTAT) 0.4 MG SL tablet Place 1 tablet (0.4 mg total) under the tongue every 5 (five) minutes as needed for chest pain. 30 tablet 3    oxyCODONE (OXY IR/ROXICODONE) 5 MG immediate release tablet Take 5 mg by mouth every 4 (four) hours as needed for pain.      promethazine (PHENERGAN) 12.5 MG tablet Take 12.5 mg by mouth every 6 (six) hours as needed for nausea or vomiting.       Rimegepant Sulfate (NURTEC) 75 MG TBDP Take 75 mg by mouth as needed. 8 tablet 6    rosuvastatin (CRESTOR) 20 MG tablet Take 1 tablet (20 mg total) by mouth daily. 90 tablet 3    thiamine 100 MG tablet  Take 100 mg by mouth daily.  Results for orders placed or performed during the hospital encounter of 10/05/21 (from the past 48 hour(s))  Urinalysis, Routine w reflex microscopic Urine, Unspecified Source     Status: Abnormal   Collection Time: 10/05/21  8:58 PM  Result Value Ref Range   Color, Urine YELLOW YELLOW   APPearance CLEAR CLEAR   Specific Gravity, Urine >1.046 (H) 1.005 - 1.030   pH 6.0 5.0 - 8.0   Glucose, UA NEGATIVE NEGATIVE mg/dL   Hgb urine dipstick NEGATIVE NEGATIVE   Bilirubin Urine NEGATIVE NEGATIVE   Ketones, ur NEGATIVE NEGATIVE mg/dL   Protein, ur NEGATIVE NEGATIVE mg/dL   Nitrite NEGATIVE NEGATIVE   Leukocytes,Ua NEGATIVE NEGATIVE    Comment: Performed at [facility], [address], [place], [place] [address]  Comprehensive metabolic panel     Status: Abnormal   Collection Time: 10/05/21  9:10 PM  Result Value Ref Range   Sodium 139 135 - 145 mmol/L   Potassium 4.4 3.5 - 5.1 mmol/L   Chloride 109 98 - 111 mmol/L   CO2 21 (L) 22 - 32 mmol/L   Glucose, Bld 94 70 - 99 mg/dL    Comment: Glucose reference range applies only to samples taken after fasting for at least 8 hours.   BUN 7 6 - 20 mg/dL   Creatinine, Ser 0.76 0.44 - 1.00 mg/dL   Calcium 9.0 8.9 - 10.3 mg/dL   Total Protein 7.7 6.5 - 8.1 g/dL   Albumin 4.0 3.5 - 5.0 g/dL   AST 23 15 - 41 U/L   ALT 16 0 - 44 U/L   Alkaline Phosphatase 72 38 - 126 U/L   Total Bilirubin 0.7 0.3 - 1.2 mg/dL   GFR, Estimated >60 >60 mL/min    Comment: (NOTE) Calculated using the CKD-EPI Creatinine Equation (2021)    Anion gap 9 5 - 15    Comment: Performed at [facility], [address] [place], [place] [address]  Lipase, blood     Status: None   Collection Time: 10/05/21  9:10 PM  Result Value Ref Range   Lipase 20 11 - 51 U/L    Comment: Performed at [facility], [address], [place], [place] [address]  CBC with Differential/Platelet     Status: Abnormal   Collection Time: 10/05/21  9:50 PM  Result  Value Ref Range   WBC 5.9 4.0 - 10.5 K/uL   RBC 4.64 3.87 - 5.11 MIL/uL   Hemoglobin 11.8 (L) 12.0 - 15.0 g/dL   HCT 33.5 (L) 36.0 - 46.0 %   MCV 72.2 (L) 80.0 - 100.0 fL   MCH 25.4 (L) 26.0 - 34.0 pg   MCHC 35.2 30.0 - 36.0 g/dL   RDW 20.5 (H) 11.5 - 15.5 %   Platelets 337 150 - 400 K/uL   nRBC 0.0 0.0 - 0.2 %   Neutrophils Relative % 30 %   Neutro Abs 1.8 1.7 - 7.7 K/uL   Lymphocytes Relative 58 %   Lymphs Abs 3.4 0.7 - 4.0 K/uL   Monocytes Relative 7 %   Monocytes Absolute 0.4 0.1 - 1.0 K/uL   Eosinophils Relative 4 %   Eosinophils Absolute 0.2 0.0 - 0.5 K/uL   Basophils Relative 1 %   Basophils Absolute 0.0 0.0 - 0.1 K/uL   Immature Granulocytes 0 %   Abs Immature Granulocytes 0.01 0.00 - 0.07 K/uL    Comment: Performed at [facility], [address] [place], [place] [address]  MRSA Next Gen  by PCR, Nasal     Status: None   Collection Time: 10/06/21  1:42 AM   Specimen: Urine, Unspecified Source; Nasal Swab  Result Value Ref Range   MRSA by PCR Next Gen NOT DETECTED NOT DETECTED    Comment: (NOTE) The GeneXpert MRSA Assay (FDA approved for NASAL specimens only), is one component of a comprehensive MRSA colonization surveillance program. It is not intended to diagnose MRSA infection nor to guide or monitor treatment for MRSA infections. Test performance is not FDA approved in patients less than 31 years old. Performed at [facility], [address], [place], [place] [address]   Comprehensive metabolic panel     Status: Abnormal   Collection Time: 10/06/21  5:55 AM  Result Value Ref Range   Sodium 140 135 - 145 mmol/L   Potassium 3.4 (L) 3.5 - 5.1 mmol/L    Comment: DELTA CHECK NOTED   Chloride 111 98 - 111 mmol/L   CO2 20 (L) 22 - 32 mmol/L   Glucose, Bld 97 70 - 99 mg/dL    Comment: Glucose reference range applies only to samples taken after fasting for at least 8 hours.   BUN 7 6 - 20 mg/dL   Creatinine, Ser 0.65 0.44 - 1.00 mg/dL   Calcium 8.8 (L) 8.9 - 10.3  mg/dL   Total Protein 7.0 6.5 - 8.1 g/dL   Albumin 3.7 3.5 - 5.0 g/dL   AST 95 (H) 15 - 41 U/L   ALT 23 0 - 44 U/L   Alkaline Phosphatase 69 38 - 126 U/L   Total Bilirubin 0.8 0.3 - 1.2 mg/dL   GFR, Estimated >60 >60 mL/min    Comment: (NOTE) Calculated using the CKD-EPI Creatinine Equation (2021)    Anion gap 9 5 - 15    Comment: Performed at [facility], [address] [place], [place] [address]  Magnesium     Status: None   Collection Time: 10/06/21  5:55 AM  Result Value Ref Range   Magnesium 2.0 1.7 - 2.4 mg/dL    Comment: Performed at [facility], [address], [place], [place] [address]  CBC with Differential/Platelet     Status: Abnormal   Collection Time: 10/06/21  5:55 AM  Result Value Ref Range   WBC 7.5 4.0 - 10.5 K/uL   RBC 4.43 3.87 - 5.11 MIL/uL   Hemoglobin 11.3 (L) 12.0 - 15.0 g/dL   HCT 32.8 (L) 36.0 - 46.0 %   MCV 74.0 (L) 80.0 - 100.0 fL   MCH 25.5 (L) 26.0 - 34.0 pg   MCHC 34.5 30.0 - 36.0 g/dL   RDW 20.4 (H) 11.5 - 15.5 %   Platelets 348 150 - 400 K/uL   nRBC 0.0 0.0 - 0.2 %   Neutrophils Relative % 37 %   Neutro Abs 2.8 1.7 - 7.7 K/uL   Lymphocytes Relative 50 %   Lymphs Abs 3.8 0.7 - 4.0 K/uL   Monocytes Relative 8 %   Monocytes Absolute 0.6 0.1 - 1.0 K/uL   Eosinophils Relative 4 %   Eosinophils Absolute 0.3 0.0 - 0.5 K/uL   Basophils Relative 1 %   Basophils Absolute 0.0 0.0 - 0.1 K/uL   Immature Granulocytes 0 %   Abs Immature Granulocytes 0.01 0.00 - 0.07 K/uL    Comment: Performed at [facility], [address], [place], [place] [address]    CT ABDOMEN PELVIS W CONTRAST  Result Date: 10/05/2021 CLINICAL DATA:  Abdominal pain with nausea,  vomiting and diarrhea. EXAM: CT ABDOMEN AND PELVIS WITH CONTRAST TECHNIQUE: Multidetector CT imaging of the abdomen and pelvis was performed using the standard protocol following bolus administration of intravenous contrast. RADIATION DOSE REDUCTION: This exam was performed according to the departmental  dose-optimization program which includes automated exposure control, adjustment of the mA and/or kV according to patient size and/or use of iterative reconstruction technique. CONTRAST:  126m OMNIPAQUE IOHEXOL 300 MG/ML  SOLN COMPARISON:  Aug 15, 2020 FINDINGS: Lower chest: No acute abnormality. Hepatobiliary: There is diffuse fatty infiltration of the liver parenchyma. No focal liver abnormality is seen. Status post cholecystectomy. Common bile duct measures 8 mm in diameter. Pancreas: Unremarkable. No pancreatic ductal dilatation or surrounding inflammatory changes. Spleen: Normal in size without focal abnormality. Adrenals/Urinary Tract: A stable 1.8 cm x 1.8 cm low-attenuation left adrenal mass is seen. Multiple surgical clips are seen within the expected region of the right adrenal gland. Kidneys are normal in size, without renal calculi or focal lesions. A mildly prominent extrarenal pelvis is seen on the right. Bladder is unremarkable. Stomach/Bowel: Surgical sutures are seen within the gastric region. Surgical clips are seen along the gastroesophageal junction. Surgically anastomosed bowel is seen along the anterior aspect of the mid to upper left abdomen and anterior aspect of the right lower quadrant. The appendix is not clearly identified. No evidence of bowel dilatation. Vascular/Lymphatic: Aortic atherosclerosis. No enlarged abdominal or pelvic lymph nodes. Reproductive: Status post hysterectomy. No adnexal masses. Other: A 6.1 cm by 1.3 cm collection of fluid is seen along the inferior aspect of the right rectus sheath (axial CT images 52 through 57, CT series 2). A mild-to-moderate amount of associated subcutaneous inflammatory fat stranding is noted. A ventral hernia is seen within this region on the prior study. A 3.6 cm x 1.7 cm fat containing ventral hernia is seen just above the level of the umbilicus, to the left of midline. Musculoskeletal: No acute or significant osseous findings. IMPRESSION:  1. 6.1 cm x 1.3 cm collection of fluid along the inferior aspect of the right rectus sheath which may represent an abscess versus postoperative seroma, with associated moderate severity cellulitis involving the adjacent portion of the abdominal wall. 2. Hepatic steatosis. 3. Stable left adrenal adenoma. 4. Extensive postoperative changes, as described above, with evidence of prior cholecystectomy and hysterectomy. 5. Aortic atherosclerosis. Aortic Atherosclerosis (ICD10-I70.0). Electronically Signed   By: TVirgina NorfolkM.D.   On: 10/05/2021 22:47    ROS:  Pertinent items are noted in HPI.  Blood pressure 110/78, pulse 89, temperature 97.7 F (36.5 C), temperature source Oral, resp. rate 18, height '5\' 3"'$  (1.6 m), weight 69.4 kg, SpO2 95 %. Physical Exam: Fatigued black female no acute distress Head is normocephalic, atraumatic Lungs clear to auscultation with equal breath sounds bilaterally Heart examination reveals regular rate and rhythm without S3, S4, murmurs Abdomen is soft with tenderness and some fullness in the right lower aspect of the abdomen with a healed surgical scar.  She has multiple upper abdominal surgical scars.  I did try to aspirate the fluid collection, but did not obtain any significant fluid other than a small less than 1 cc of blood.  The incision does not look erythematous or indurated at the present time.  CT scan images personally reviewed  Assessment/Plan: Impression: Most likely postoperative seroma/hematoma, status post ventral herniorrhaphy with probable mesh 1 month ago.  She has had multiple abdominal surgeries with abdominal wall reconstruction in the past.  She has also been  noted to have recurrent seromas of the abdominal wall.  This was done at an outside facility.  Given the fact that her white blood cell count is within normal limits.  No further intervention is warranted.  Would continue IV antibiotics.  May advance diet as tolerated.  We will reassess in  the morning.  Aviva Signs 10/06/2021, 9:21 AM

## 2021-10-06 NOTE — Plan of Care (Signed)
  Problem: Pain Managment: Goal: General experience of comfort will improve Outcome: Progressing   Problem: Safety: Goal: Ability to remain free from injury will improve Outcome: Progressing   Problem: Skin Integrity: Goal: Risk for impaired skin integrity will decrease Outcome: Progressing   

## 2021-10-06 NOTE — Hospital Course (Signed)
Angel Price is a 56 y.o. female with medical history significant of adrenal mass, alcohol abuse-reports she only has 2 beers per day 2 days/week at this time, anxiety, depression, gastroparesis, GERD, hiatal hernia, history of Billroth II operation, hypertension, lupus, Schatzki's ring, sickle cell trait, stroke, heart murmur, and more presents ED with a chief complaint of nausea and vomiting.   Patient reports she had nausea vomiting diarrhea and abdominal pain for 3 days.  She describes abdominal pain as burning pain.  Its been constant.  She has had 6-7 episodes of emesis per day.  She reports no hematemesis nor coffee-ground emesis.  Any p.o. intake makes her nausea worse.  She is also had diarrhea 6-7 times a day.  No melena or hematochezia.  Neither the act of throwing up or having diarrhea relieve her abdominal pain.  She has not tried any meds at home for her abdominal pain because she has been so nauseous.  Patient reports she does have chronic nausea and vomiting, but she came into the ED because her pain was worse.  She reports she has been hurting and burning and she had surgery 1 month ago.  Her surgery was for an incarcerated hernia in [place] 1 month ago.  Patient reports that she has had subjective fever and chills.  Her last normal bowel movement was last week.  Her last normal meal was last week.  She had a decreased appetite.  She is trying to eat, but then it all comes right back up.  She has not even been able to keep fluids down.  Patient reports she has a history of A-fib but she has had intermittent palpitations.  Patient has no other complaints at this time.   Patient currently smokes 5 cigarettes/day.  She declines nicotine patch at this time.  She reports she is trying to quit.  She drinks 2 beers twice per week.  She does not use illicit drugs.  She is vaccinated for COVID.  Patient is full code.  ED Temp 97.9, heart rate 98.1, respiratory rate 17-18, blood pressure  136/90-143/105 No leukocytosis at 5.9, hemoglobin 11.8 Chemistry is unremarkable aside from a decreased bicarb at 21 CT abdomen pelvis shows a 6 x 1 fluid collection in the rectus sheath that could be abscess or seroma with overlying cellulitis Fentanyl, Zofran, vancomycin given in the ED General surgery was consulted and reports they will see patient in the a.m. Admission requested for abdominal pain with abnormal CT

## 2021-10-06 NOTE — Assessment & Plan Note (Addendum)
-  Pharmacy medication revealed that patient has been off Cardizem for over a year now Rate controlled with beta-blockers -on [place]  -No other anticoagulation

## 2021-10-06 NOTE — Progress Notes (Signed)
PROGRESS NOTE    Patient: Angel Price                            PCP: Carrolyn Meiers, MD                    DOB: 12-29-1965            DOA: 10/05/2021 ZOX:[id number]             DOS: 10/06/2021, 10:35 AM   LOS: 0 days   Date of Service: The patient was seen and examined on 10/06/2021  Subjective:   The patient was seen and examined this morning. Stable at this time.  Afebrile, normotensive, negative for no leukocytosis Otherwise no issues overnight .  Brief Narrative:   Angel Price is a 56 y.o. female with medical history significant of adrenal mass, alcohol abuse-reports she only has 2 beers per day 2 days/week at this time, anxiety, depression, gastroparesis, GERD, hiatal hernia, history of Billroth II operation, hypertension, lupus, Schatzki's ring, sickle cell trait, stroke, heart murmur, and more presents ED with a chief complaint of nausea and vomiting.   Patient reports she had nausea vomiting diarrhea and abdominal pain for 3 days.  She describes abdominal pain as burning pain.  Its been constant.  She has had 6-7 episodes of emesis per day.  She reports no hematemesis nor coffee-ground emesis.  Any p.o. intake makes her nausea worse.  She is also had diarrhea 6-7 times a day.  No melena or hematochezia.  Neither the act of throwing up or having diarrhea relieve her abdominal pain.  She has not tried any meds at home for her abdominal pain because she has been so nauseous.  Patient reports she does have chronic nausea and vomiting, but she came into the ED because her pain was worse.  She reports she has been hurting and burning and she had surgery 1 month ago.  Her surgery was for an incarcerated hernia in [place] 1 month ago.  Patient reports that she has had subjective fever and chills.  Her last normal bowel movement was last week.  Her last normal meal was last week.  She had a decreased appetite.  She is trying to eat, but then it all comes right back up.  She has  not even been able to keep fluids down.  Patient reports she has a history of A-fib but she has had intermittent palpitations.  Patient has no other complaints at this time.   Patient currently smokes 5 cigarettes/day.  She declines nicotine patch at this time.  She reports she is trying to quit.  She drinks 2 beers twice per week.  She does not use illicit drugs.  She is vaccinated for COVID.  Patient is full code.  ED Temp 97.9, heart rate 98.1, respiratory rate 17-18, blood pressure 136/90-143/105 No leukocytosis at 5.9, hemoglobin 11.8 Chemistry is unremarkable aside from a decreased bicarb at 21 CT abdomen pelvis shows a 6 x 1 fluid collection in the rectus sheath that could be abscess or seroma with overlying cellulitis Fentanyl, Zofran, vancomycin given in the ED General surgery was consulted and reports they will see patient in the a.m. Admission requested for abdominal pain with abnormal CT    Assessment & Plan:   Principal Problem:   Abdominal pain Active Problems:   Tobacco use disorder   Post-operative wound abscess   Hypokalemia  Essential hypertension   Mixed hyperlipidemia   Cellulitis   Paroxysmal atrial fibrillation (HCC)     Assessment and Plan: * Abdominal pain -hx of multiple abdominal surgeries -CT abdomen shows 6cmx1cm fluid collection - seroma vs abscess with overlying cellulitis -Continue Vancomycin -Start zosyn -Status post evaluation by general surgery Dr. Arnoldo Morale who appreciated the CT evaluation revealing 1.3 x 6 cm fluid collection in the rectus sheath he attempted to aspirate with needle with no success. Stating this is most likely postoperative seroma/hematoma..  With extensive history abdominal surgeries and recurrent seroma in abdominal wall. No further intervention was recommended, continuing antibiotic for next 24 hours then can be discharged home.  -Pain control -clear liquids--- advancing as tolerated -continue to monitor  Paroxysmal  atrial fibrillation [facility] -Pharmacy medication revealed that patient has been off Cardizem for over a year now Rate controlled with beta-blockers -on Asa  -No other anticoagulation  Cellulitis -Very mild, no signs of erythema edema and abdominal, postsurgical site (status post ventral herniaorrhaphy with probable mesh placement a month ago) -continue vancomycin -continue to monitor  Mixed hyperlipidemia -cont. crestor  Essential hypertension -Continue diltiazem, norvasc, atenolol,  -Holding lisinopril, HCTZ--due to hypokalemia, and preserving kidney function--BP stable  Hypokalemia - Potassium 3.4, repleting with p.o. supplement kcl   Post-operative wound abscess Per CT scan 1.3 x 6 cm fluid collection in rectus sheath Likely seroma versus hematoma... Postop hernia with mesh repair over a month ago in outside facility. Status post surgery evaluation by Dr. Arnoldo Morale attempted needle aspiration with no results Recommended conservative management-   Tobacco use disorder -5 ciggs per day -Counseled on the importance of cessation -declines patch at this time           -------------------------------------------------------------------------------------------------------------------------------------------  DVT prophylaxis:  heparin injection 5,000 Units Start: 10/06/21 0600 SCDs Start: 10/06/21 0101   Code Status:   Code Status: Full Code  Family Communication: No family member present at bedside- attempt will be made to update daily The above findings and plan of care has been discussed with patient (and family)  in detail,  they expressed understanding and agreement of above. -Advance care planning has been discussed.   Admission status:   Status is: Observation The patient remains OBS appropriate and will d/c before 2 midnights.     Procedures:   No admission procedures for hospital encounter.   Antimicrobials:  Anti-infectives (From admission, onward)     Start     Dose/Rate Route Frequency Ordered Stop   10/06/21 1200  vancomycin (VANCOREADY) IVPB 750 mg/150 mL        750 mg 150 mL/hr over 60 Minutes Intravenous Every 12 hours 10/05/21 2350     10/06/21 0200  piperacillin-tazobactam (ZOSYN) IVPB 3.375 g        3.375 g 12.5 mL/hr over 240 Minutes Intravenous Every 8 hours 10/06/21 0101     10/05/21 2345  vancomycin (VANCOREADY) IVPB 1250 mg/250 mL        1,250 mg 166.7 mL/hr over 90 Minutes Intravenous  Once 10/05/21 2334 10/06/21 0052   10/05/21 2315  vancomycin (VANCOCIN) IVPB 1000 mg/200 mL premix  Status:  Discontinued        1,000 mg 200 mL/hr over 60 Minutes Intravenous  Once 10/05/21 2301 10/05/21 2334        Medication:   amLODipine  10 mg Oral Daily   aspirin EC  81 mg Oral Daily   atenolol  50 mg Oral Daily   DULoxetine  30 mg Oral BID  heparin  5,000 Units Subcutaneous Q8H   lisinopril  20 mg Oral Daily   And   [START ON 10/07/2021] hydrochlorothiazide  12.5 mg Oral Daily   isosorbide mononitrate  30 mg Oral Daily   mirtazapine  15 mg Oral QHS   rosuvastatin  20 mg Oral Daily   thiamine  100 mg Oral Daily    sodium chloride, acetaminophen **OR** acetaminophen, hydrALAZINE, morphine injection, ondansetron **OR** ondansetron (ZOFRAN) IV, oxyCODONE   Objective:   Vitals:   10/05/21 2240 10/06/21 0004 10/06/21 0053 10/06/21 0610  BP: 136/90 (!) 146/98 (!) 130/91 110/78  Pulse: 81 89 84 89  Resp: 18 18    Temp: 98.1 F (36.7 C) 98.2 F (36.8 C) 98.3 F (36.8 C) 97.7 F (36.5 C)  TempSrc:  Oral Oral Oral  SpO2: 96% 98% 95% 95%  Weight:      Height:        Intake/Output Summary (Last 24 hours) at 10/06/2021 1035 Last data filed at 10/06/2021 0900 Gross per 24 hour  Intake 503.19 ml  Output 200 ml  Net 303.19 ml   Filed Weights   10/05/21 2043  Weight: 69.4 kg     Examination:   Physical Exam  Constitution:  Alert, cooperative, no distress,  Appears calm and comfortable  Psychiatric:    Normal and stable mood and affect, cognition intact,   HEENT:        Normocephalic, PERRL, otherwise with in Normal limits  Chest:         Chest symmetric Cardio vascular:  S1/S2, RRR, No murmure, No Rubs or Gallops  pulmonary: Clear to auscultation bilaterally, respirations unlabored, negative wheezes / crackles Abdomen: Extensive multiple healed surgical scars, tenderness at periumbilical on the right.  Otherwise soft, non-tender, non-distended, bowel sounds,no masses, no organomegaly Muscular skeletal: Limited exam - in bed, able to move all 4 extremities,   Neuro: CNII-XII intact. , normal motor and sensation, reflexes intact  Extremities: No pitting edema lower extremities, +2 pulses  Skin: Dry, warm to touch, negative for any Rashes, No open wounds Wounds: per nursing documentation   ------------------------------------------------------------------------------------------------------------------------------------------    LABs:     Latest Ref Rng & Units 10/06/2021    5:55 AM 10/05/2021    9:50 PM 09/27/2021    2:38 PM  CBC  WBC 4.0 - 10.5 K/uL 7.5  5.9  4.9   Hemoglobin 12.0 - 15.0 g/dL 11.3  11.8  12.0   Hematocrit 36.0 - 46.0 % 32.8  33.5  35.6   Platelets 150 - 400 K/uL 348  337  518       Latest Ref Rng & Units 10/06/2021    5:55 AM 10/05/2021    9:10 PM 09/27/2021    2:38 PM  CMP  Glucose 70 - 99 mg/dL 97  94  88   BUN 6 - 20 mg/dL '7  7  10   '$ Creatinine 0.44 - 1.00 mg/dL 0.65  0.76  0.50   Sodium 135 - 145 mmol/L 140  139  139   Potassium 3.5 - 5.1 mmol/L 3.4  4.4  3.9   Chloride 98 - 111 mmol/L 111  109  108   CO2 22 - 32 mmol/L '20  21  23   '$ Calcium 8.9 - 10.3 mg/dL 8.8  9.0  9.1   Total Protein 6.5 - 8.1 g/dL 7.0  7.7  7.2   Total Bilirubin 0.3 - 1.2 mg/dL 0.8  0.7  0.6   Alkaline Phos 38 -  126 U/L 69  72  65   AST 15 - 41 U/L 95  23  20   ALT 0 - 44 U/L '23  16  17        '$ Micro Results Recent Results (from the past 240 hour(s))  MRSA Next Gen by PCR,  Nasal     Status: None   Collection Time: 10/06/21  1:42 AM   Specimen: Urine, Unspecified Source; Nasal Swab  Result Value Ref Range Status   MRSA by PCR Next Gen NOT DETECTED NOT DETECTED Final    Comment: (NOTE) The GeneXpert MRSA Assay (FDA approved for NASAL specimens only), is one component of a comprehensive MRSA colonization surveillance program. It is not intended to diagnose MRSA infection nor to guide or monitor treatment for MRSA infections. Test performance is not FDA approved in patients less than 26 years old. Performed at [facility], [address], [place], [place] [address]     Radiology Reports CT ABDOMEN PELVIS W CONTRAST  Result Date: 10/05/2021 CLINICAL DATA:  Abdominal pain with nausea, vomiting and diarrhea. EXAM: CT ABDOMEN AND PELVIS WITH CONTRAST TECHNIQUE: Multidetector CT imaging of the abdomen and pelvis was performed using the standard protocol following bolus administration of intravenous contrast. RADIATION DOSE REDUCTION: This exam was performed according to the departmental dose-optimization program which includes automated exposure control, adjustment of the mA and/or kV according to patient size and/or use of iterative reconstruction technique. CONTRAST:  151m OMNIPAQUE IOHEXOL 300 MG/ML  SOLN COMPARISON:  Aug 15, 2020 FINDINGS: Lower chest: No acute abnormality. Hepatobiliary: There is diffuse fatty infiltration of the liver parenchyma. No focal liver abnormality is seen. Status post cholecystectomy. Common bile duct measures 8 mm in diameter. Pancreas: Unremarkable. No pancreatic ductal dilatation or surrounding inflammatory changes. Spleen: Normal in size without focal abnormality. Adrenals/Urinary Tract: A stable 1.8 cm x 1.8 cm low-attenuation left adrenal mass is seen. Multiple surgical clips are seen within the expected region of the right adrenal gland. Kidneys are normal in size, without renal calculi or focal lesions. A mildly prominent extrarenal  pelvis is seen on the right. Bladder is unremarkable. Stomach/Bowel: Surgical sutures are seen within the gastric region. Surgical clips are seen along the gastroesophageal junction. Surgically anastomosed bowel is seen along the anterior aspect of the mid to upper left abdomen and anterior aspect of the right lower quadrant. The appendix is not clearly identified. No evidence of bowel dilatation. Vascular/Lymphatic: Aortic atherosclerosis. No enlarged abdominal or pelvic lymph nodes. Reproductive: Status post hysterectomy. No adnexal masses. Other: A 6.1 cm by 1.3 cm collection of fluid is seen along the inferior aspect of the right rectus sheath (axial CT images 52 through 57, CT series 2). A mild-to-moderate amount of associated subcutaneous inflammatory fat stranding is noted. A ventral hernia is seen within this region on the prior study. A 3.6 cm x 1.7 cm fat containing ventral hernia is seen just above the level of the umbilicus, to the left of midline. Musculoskeletal: No acute or significant osseous findings. IMPRESSION: 1. 6.1 cm x 1.3 cm collection of fluid along the inferior aspect of the right rectus sheath which may represent an abscess versus postoperative seroma, with associated moderate severity cellulitis involving the adjacent portion of the abdominal wall. 2. Hepatic steatosis. 3. Stable left adrenal adenoma. 4. Extensive postoperative changes, as described above, with evidence of prior cholecystectomy and hysterectomy. 5. Aortic atherosclerosis. Aortic Atherosclerosis (ICD10-I70.0). Electronically Signed   By: TVirgina NorfolkM.D.   On: 10/05/2021  22:47    SIGNED: Deatra James, MD, FHM. [facility],  Pager (please use [email or web address] to page/text) Please use Epic Secure Chat for non-urgent communication (7AM-7PM)  If 7PM-7AM, please contact night-coverage [email or web address], 10/06/2021, 10:35 AM

## 2021-10-06 NOTE — Assessment & Plan Note (Signed)
-   Potassium 3.4, repleting with p.o. supplement kcl

## 2021-10-06 NOTE — Assessment & Plan Note (Addendum)
-  Very mild, no signs of erythema edema and abdominal, postsurgical site (status post ventral herniaorrhaphy with probable mesh placement a month ago) -continue vancomycin--discontinue switch to p.o. Bactrim

## 2021-10-06 NOTE — Assessment & Plan Note (Addendum)
-  cont. crestor

## 2021-10-06 NOTE — Assessment & Plan Note (Signed)
-  5 ciggs per day -Counseled on the importance of cessation -declines patch at this time

## 2021-10-06 NOTE — H&P (Signed)
History and Physical    Patient: Angel Price QMV:[id number] DOB: 10-Nov-1965 DOA: 10/05/2021 DOS: the patient was seen and examined on 10/06/2021 PCP: Carrolyn Meiers, MD  Patient coming from: Home  Chief Complaint:  Chief Complaint  Patient presents with   Abdominal Pain   HPI: Angel Price is a 56 y.o. female with medical history significant of adrenal mass, alcohol abuse-reports she only has 2 beers per day 2 days/week at this time, anxiety, depression, gastroparesis, GERD, hiatal hernia, history of Billroth II operation, hypertension, lupus, Schatzki's ring, sickle cell trait, stroke, heart murmur, and more presents ED with a chief complaint of nausea and vomiting.  Patient reports she had nausea vomiting diarrhea and abdominal pain for 3 days.  She describes abdominal pain as burning pain.  Its been constant.  She has had 6-7 episodes of emesis per day.  She reports no hematemesis nor coffee-ground emesis.  Any p.o. intake makes her nausea worse.  She is also had diarrhea 6-7 times a day.  No melena or hematochezia.  Neither the act of throwing up or having diarrhea relieve her abdominal pain.  She has not tried any meds at home for her abdominal pain because she has been so nauseous.  Patient reports she does have chronic nausea and vomiting, but she came into the ED because her pain was worse.  She reports she has been hurting and burning and she had surgery 1 month ago.  Her surgery was for an incarcerated hernia in [place] 1 month ago.  Patient reports that she has had subjective fever and chills.  Her last normal bowel movement was last week.  Her last normal meal was last week.  She had a decreased appetite.  She is trying to eat, but then it all comes right back up.  She has not even been able to keep fluids down.  Patient reports she has a history of A-fib but she has had intermittent palpitations.  Patient has no other complaints at this time.  Patient currently smokes 5  cigarettes/day.  She declines nicotine patch at this time.  She reports she is trying to quit.  She drinks 2 beers twice per week.  She does not use illicit drugs.  She is vaccinated for COVID.  Patient is full code. Review of Systems: As mentioned in the history of present illness. All other systems reviewed and are negative. Past Medical History:  Diagnosis Date   Abscess    soft tissue   Adrenal mass (HCC)    Alcohol abuse    Anemia    Anxiety    Blood transfusion without reported diagnosis    Chronic abdominal pain    Chronic wound infection of abdomen    Colon polyp    colonoscopy 04/2014   Depression    Discoid lupus erythematosus    Diverticulosis    colonoscopy 04/2014 moderat pan colonic   Drug-seeking behavior    Gastritis    EGD 05/2014   Gastroparesis 01/2014   GERD (gastroesophageal reflux disease)    Hemorrhoid    internal large   Hiatal hernia    History of Billroth II operation    Hypertension    Lung nodule    CT 02/2014 needs repeat 1 month   Lung nodule < 6cm on CT 04/25/2014   Lupus (HCC)    Malingering    Migraine    Nausea and vomiting    chronic, recurrent   Pancreatitis    Schatzki's ring  patent per EGD 04/2014   Sickle cell trait ([place])    Stroke [facility])    Suicide attempt [facility])    Thyroid disease 2000   overactive, radiation   Past Surgical History:  Procedure Laterality Date   ABDOMINAL HYSTERECTOMY  2013   [place]   ABDOMINAL HYSTERECTOMY     ABDOMINAL SURGERY     ADRENALECTOMY Right    AGILE CAPSULE N/A 01/05/2015   Procedure: AGILE CAPSULE;  Surgeon: Daneil Dolin, MD;  [facility];  Service: Endoscopy;  Laterality: N/A;  0700   BALLOON DILATION N/A 10/01/2021   Procedure: BALLOON DILATION;  Surgeon: Eloise Harman, DO;  [facility];  Service: Endoscopy;  Laterality: N/A;   Billroth II procedure      [place], first 2000, 2005/2006.   bilroth 2     BIOPSY  05/20/2013   Procedure: BIOPSIES OF ASCENDING  AND SIGMOID COLON;  Surgeon: Daneil Dolin, MD;  Location: [facility];  Service: Endoscopy;;   BIOPSY  04/26/2014   Procedure: BIOPSIES;  Surgeon: Danie Binder, MD;  Location: [facility];  Service: Endoscopy;;   BIOPSY  12/24/2018   Procedure: BIOPSY;  Surgeon: Daneil Dolin, MD;  [facility];  Service: Endoscopy;;  colon   CHOLECYSTECTOMY     COLONOSCOPY     in [place]   COLONOSCOPY WITH PROPOFOL N/A 05/20/2013   Dr.Rourk- inadequate prep, normal appearing rectum, grossly normal colon aside from pancolonic diverticula, normal terminal ileum bx= unremarkable colonic mucosa. Due for early interval 2016.    COLONOSCOPY WITH PROPOFOL N/A 04/26/2014   VFI:EPPIRJ ileum/one colon polyp removed/moderate pan-colonic diverticulosis/large internal hemorrhoids   COLONOSCOPY WITH PROPOFOL N/A 12/23/2014   Dr.Rourk- minimal internal hemorrhoids, pancolonic diverticulosis   COLONOSCOPY WITH PROPOFOL N/A 08/23/2016   Procedure: COLONOSCOPY WITH PROPOFOL;  Surgeon: Danie Binder, MD;  [facility];  Service: Endoscopy;  Laterality: N/A;   COLONOSCOPY WITH PROPOFOL N/A 12/24/2018   Pancolonic diverticulosis, normal TI, one 5 mm polyp in rectum (tubular adenoma). Somewhat friable hemorrhagic mucosa in area of IC valve/cecum, possibly related to scope trauma s/p biopsy. Repeat in 7 years.   DEBRIDEMENT OF ABDOMINAL WALL ABSCESS N/A 02/08/2013   Procedure: DEBRIDEMENT OF ABDOMINAL WALL ABSCESS;  Surgeon: Jamesetta So, MD;  Location: [facility];  Service: General;  Laterality: N/A;   ESOPHAGOGASTRODUODENOSCOPY (EGD) WITH PROPOFOL N/A 05/20/2013   Dr.Rourk- s/p prior gastric surgery with normal esophagus, residual gastric mucosa and patent efferent limb   ESOPHAGOGASTRODUODENOSCOPY (EGD) WITH PROPOFOL N/A 02/03/2014   Dr. Gala Romney:  s/p hemigastrectomy with retained gastric contents. Residual gastric mucosa and efferent limb appeared normal otherwise. Query gastroparesis.     ESOPHAGOGASTRODUODENOSCOPY (EGD) WITH PROPOFOL N/A 04/26/2014   JOA:[id number] ring/small HH/mild non-erosive gasrtitis/normal anastomosis   ESOPHAGOGASTRODUODENOSCOPY (EGD) WITH PROPOFOL N/A 12/23/2014   Dr.Rourk- s/p prior hemigastrctomy, active oozing from anastomotic suture site, hemostasis achieved   ESOPHAGOGASTRODUODENOSCOPY (EGD) WITH PROPOFOL N/A 05/23/2016   Dr. 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    ESOPHAGOGASTRODUODENOSCOPY (EGD) WITH PROPOFOL N/A 05/11/2017   Procedure: ESOPHAGOGASTRODUODENOSCOPY (EGD) WITH PROPOFOL;  Surgeon: Daneil Dolin, MD;  [facility];  Service: Endoscopy;  Laterality: N/A;   ESOPHAGOGASTRODUODENOSCOPY (EGD) WITH PROPOFOL N/A 06/07/2021   Procedure: ESOPHAGOGASTRODUODENOSCOPY (EGD) WITH PROPOFOL;  Surgeon: Daneil Dolin, MD;  [facility];  Service: Endoscopy;  Laterality: N/A;  11:45am   ESOPHAGOGASTRODUODENOSCOPY (  EGD) WITH PROPOFOL N/A 10/01/2021   Procedure: ESOPHAGOGASTRODUODENOSCOPY (EGD) WITH PROPOFOL;  Surgeon: Eloise Harman, DO;  [facility];  Service: Endoscopy;  Laterality: N/A;  3:00pm   EXCISION OF MESH  08/2019   HEMORRHOID SURGERY N/A 06/18/2017   Procedure: THREE COLUMN EXTENSIVE HEMORRHOIDECTOMY;  Surgeon: Virl Cagey, MD;  Location[facility];  Service: General;  Laterality: N/A;   INCISION AND DRAINAGE ABSCESS N/A 01/06/2017   Procedure: INCISION AND DRAINAGE ABDOMINAL WALL ABSCESS;  Surgeon: Aviva Signs, MD;  Location: [facility];  Service: General;  Laterality: N/A;   incisional hernial repair  09/05/2021   [place]   LEFT HEART CATH AND CORONARY ANGIOGRAPHY N/A 05/09/2020   Procedure: LEFT HEART CATH AND CORONARY ANGIOGRAPHY;  Surgeon: Nigel Mormon, MD;  Location: [place] [facility];  Service: Cardiovascular;  Laterality: N/A;   POLYPECTOMY  12/24/2018   Procedure: POLYPECTOMY;  Surgeon:  Daneil Dolin, MD;  [facility];  Service: Endoscopy;;  colon   tendon repar Right    wrist   WOUND EXPLORATION Right 06/24/2013   Procedure: exploration of traumatic wound right wrist;  Surgeon: Tennis Must, MD;  Location: [place];  Service: Orthopedics;  Laterality: Right;   Social History:  reports that she has been smoking cigarettes. She has a 17.50 pack-year smoking history. She has never used smokeless tobacco. She reports current alcohol use. She reports that she does not currently use drugs after having used the following drugs: Cocaine.  Allergies  Allergen Reactions   Metoclopramide Other (See Comments) and Anaphylaxis    EPS symptoms  Jerking movements   Codeine Hives   Reglan [Metoclopramide] Other (See Comments)    EPS symptoms     Family History  Problem Relation Age of Onset   Drug abuse Mother    Lung cancer Father    Cancer Father        mets   Drug abuse Sister    Drug abuse Brother    Breast cancer Maternal Aunt    Bipolar disorder Maternal Aunt    Drug abuse Maternal Aunt    Colon cancer Maternal Grandmother        late 59s, early 29s   Bipolar disorder Paternal Grandfather    Brain cancer Son    Schizophrenia Son    Cancer Son        brain   Bipolar disorder Cousin    Liver disease Neg Hx     Prior to Admission medications   Medication Sig Start Date End Date Taking? Authorizing Provider  amLODipine (NORVASC) 10 MG tablet Take 10 mg by mouth daily. 05/18/21   [provider]  aspirin EC 81 MG EC tablet Take 1 tablet (81 mg total) by mouth daily. 05/19/19   Elgergawy, Silver Huguenin, MD  atenolol (TENORMIN) 50 MG tablet Take 50 mg by mouth daily. 05/18/21   [provider]  baclofen (LIORESAL) 10 MG tablet Take 10 mg by mouth 2 (two) times daily as needed for muscle spasms. 09/22/20   [provider]  diltiazem (CARDIZEM CD) 240 MG 24 hr capsule TAKE 1 CAPSULE(240 MG) BY MOUTH DAILY 01/22/21   Patwardhan, Manish J, MD   docusate sodium (COLACE) 100 MG capsule Take 100 mg by mouth 2 (two) times daily.    [provider]  DULoxetine (CYMBALTA) 30 MG capsule Take 30 mg by mouth 2 (two) times daily. 08/14/21   [provider]  esomeprazole (NEXIUM) 40 MG capsule  Take 1 capsule (40 mg total) by mouth 2 (two) times daily before a meal. Patient taking differently: Take 40 mg by mouth daily before breakfast. 09/14/21   Erenest Rasher, PA-C  hydroxychloroquine (PLAQUENIL) 200 MG tablet Take 1.5 tablets (300 mg total) by mouth daily. 03/08/21   Collier Salina, MD  isosorbide mononitrate (IMDUR) 60 MG 24 hr tablet TAKE 1/2 TABLET BY MOUTH DAILY 06/25/21   Patwardhan, Reynold Bowen, MD  lisinopril-hydrochlorothiazide (ZESTORETIC) 20-12.5 MG tablet Take 1 tablet by mouth every morning. 10/31/20   [provider]  methocarbamol (ROBAXIN) 500 MG tablet Take 500 mg by mouth 3 (three) times daily.    [provider]  mirtazapine (REMERON) 15 MG tablet Take 15 mg by mouth at bedtime as needed. 08/10/21   [provider]  nitroGLYCERIN (NITROSTAT) 0.4 MG SL tablet Place 1 tablet (0.4 mg total) under the tongue every 5 (five) minutes as needed for chest pain. 12/29/19 09/21/21  Patwardhan, Reynold Bowen, MD  oxyCODONE (OXY IR/ROXICODONE) 5 MG immediate release tablet Take 5 mg by mouth every 4 (four) hours as needed for pain. 09/11/21   [provider]  promethazine (PHENERGAN) 12.5 MG tablet Take 12.5 mg by mouth every 6 (six) hours as needed for nausea or vomiting.  12/18/19   [provider]  Rimegepant Sulfate (NURTEC) 75 MG TBDP Take 75 mg by mouth as needed. 09/12/21   Genia Harold, MD  rosuvastatin (CRESTOR) 20 MG tablet Take 1 tablet (20 mg total) by mouth daily. 06/25/19 11/06/28  Patwardhan, Reynold Bowen, MD  thiamine 100 MG tablet Take 100 mg by mouth daily. 05/18/21   [provider]    Physical Exam: Vitals:   10/05/21 2043 10/05/21 2240 10/06/21 0004 10/06/21 0053   BP:  136/90 (!) 146/98 (!) 130/91  Pulse:  81 89 84  Resp:  18 18   Temp:  98.1 F (36.7 C) 98.2 F (36.8 C) 98.3 F (36.8 C)  TempSrc:   Oral Oral  SpO2:  96% 98% 95%  Weight: 69.4 kg     Height: '5\' 3"'$  (1.6 m)      1.  General: Patient lying supine in bed,  no acute distress   2. Psychiatric: Alert and oriented x 3, mood and behavior normal for situation, pleasant and cooperative with exam   3. Neurologic: Speech and language are normal, face is symmetric, moves all 4 extremities voluntarily, at baseline without acute deficits on limited exam   4. HEENMT:  Head is atraumatic, normocephalic, pupils reactive to light, neck is supple, trachea is midline, mucous membranes are moist   5. Respiratory : Lungs are clear to auscultation bilaterally without wheezing, rhonchi, rales, no cyanosis, no increase in work of breathing or accessory muscle use   6. Cardiovascular : Heart rate normal, rhythm is regular, murmur present, rubs or gallops, no peripheral edema, peripheral pulses palpated   7. Gastrointestinal:  Abdomen is soft, mildly distended and tender to palpation in the right upper and lower quadrants, bowel sounds active, no masses or organomegaly palpated   8. Skin:  Skin is warm, dry and intact without rashes, acute lesions, or ulcers on limited exam   9.Musculoskeletal:  No acute deformities or trauma, no asymmetry in tone, no peripheral edema, peripheral pulses palpated, no tenderness to palpation in the extremities  Data Reviewed: In the ED Temp 97.9, heart rate 98.1, respiratory rate 17-18, blood pressure 136/90-143/105 No leukocytosis at 5.9, hemoglobin 11.8 Chemistry is unremarkable aside from a  decreased bicarb at 21 CT abdomen pelvis shows a 6 x 1 fluid collection in the rectus sheath that could be abscess or seroma with overlying cellulitis Fentanyl, Zofran, vancomycin given in the ED General surgery was consulted and reports they will see patient in the  a.m. Admission requested for abdominal pain with abnormal CT  Assessment and Plan: * Abdominal pain -hx of multiple abdominal surgeries -CT abdomen shows 6cmx1cm fluid collection - seroma vs abscess with overlying cellulitis -Continue Vancomycin -Start zosyn -Gen surg to see in the AM -Pain control -clear liquids -continue to monitor  Paroxysmal atrial fibrillation (HCC) -Continue diltiazem -on [place]  -No other anticoagulation  Cellulitis -continue vancomycin -continue to monitor  Mixed hyperlipidemia -crestor  Essential hypertension -Continue diltiazem, norvasc, atenolol, lisinopril, HCTZ  Tobacco use disorder -5 ciggs per day -Counseled on the importance of cessation -declines patch at this time      Advance Care Planning:   Code Status: Full Code   Consults: General surgery  Family Communication: No family at bedside  Severity of Illness: The appropriate patient status for this patient is OBSERVATION. Observation status is judged to be reasonable and necessary in order to provide the required intensity of service to ensure the patient's safety. The patient's presenting symptoms, physical exam findings, and initial radiographic and laboratory data in the context of their medical condition is felt to place them at decreased risk for further clinical deterioration. Furthermore, it is anticipated that the patient will be medically stable for discharge from the hospital within 2 midnights of admission.   Author: Rolla Plate, DO 10/06/2021 4:39 AM  For on call review [email or web address].

## 2021-10-06 NOTE — Assessment & Plan Note (Addendum)
-  Continue lisinopril, HCTZ,, norvasc, atenolol,  -Patient is off Cardizem

## 2021-10-06 NOTE — Assessment & Plan Note (Addendum)
-  hx of multiple abdominal surgeries -CT abdomen shows 6cmx1cm fluid collection - seroma vs abscess with overlying cellulitis -Continue Vancomycin -Start zosyn -Status post evaluation by general surgery Dr. Arnoldo Morale who appreciated the CT evaluation revealing 1.3 x 6 cm fluid collection in the rectus sheath he attempted to aspirate with needle with no success. Stating this is most likely postoperative seroma/hematoma..  With extensive history abdominal surgeries and recurrent seroma in abdominal wall. No further intervention was recommended, continuing antibiotic for next 24 hours then can be discharged home.  -Pain control -clear liquids--- advancing as tolerated -continue to monitor

## 2021-10-06 NOTE — Assessment & Plan Note (Signed)
Per CT scan 1.3 x 6 cm fluid collection in rectus sheath Likely seroma versus hematoma... Postop hernia with mesh repair over a month ago in outside facility. Status post surgery evaluation by Dr. Arnoldo Morale attempted needle aspiration with no results Recommended conservative management-

## 2021-10-07 DIAGNOSIS — R188 Other ascites: Secondary | ICD-10-CM

## 2021-10-07 DIAGNOSIS — R109 Unspecified abdominal pain: Secondary | ICD-10-CM | POA: Diagnosis not present

## 2021-10-07 DIAGNOSIS — L03311 Cellulitis of abdominal wall: Secondary | ICD-10-CM

## 2021-10-07 DIAGNOSIS — E876 Hypokalemia: Secondary | ICD-10-CM | POA: Diagnosis not present

## 2021-10-07 LAB — CALCIUM, IONIZED: Calcium, Ionized, Serum: 5.2 mg/dL (ref 4.5–5.6)

## 2021-10-07 MED ORDER — OXYCODONE HCL 5 MG PO TABS: 5.0000 mg | ORAL_TABLET | ORAL | 0 refills | Status: AC | PRN

## 2021-10-07 MED ORDER — LACTINEX PO CHEW: 1.0000 | CHEWABLE_TABLET | Freq: Three times a day (TID) | ORAL | 0 refills | Status: AC

## 2021-10-07 MED ORDER — SULFAMETHOXAZOLE-TRIMETHOPRIM 800-160 MG PO TABS: 1.0000 | ORAL_TABLET | Freq: Two times a day (BID) | ORAL | 0 refills | Status: AC

## 2021-10-07 NOTE — Discharge Summary (Signed)
Physician Discharge Summary   Patient: Angel Price MRN: [id number] DOB: January 09, 1966  Admit date:     10/05/2021  Discharge date: 10/07/21  Discharge Physician: Deatra James   PCP: Carrolyn Meiers, MD   Recommendations at discharge:    Follow up with PCP and Surgery in 1-2 wks   Discharge Diagnoses: Principal Problem:   Abdominal pain Active Problems:   Tobacco use disorder   Post-operative wound abscess   Hypokalemia   Essential hypertension   Mixed hyperlipidemia   Cellulitis   Paroxysmal atrial fibrillation (HCC)   Abdominal fluid collection  Resolved Problems:   * No resolved hospital problems. *  Hospital Course: Angel Price is a 56 y.o. female with medical history significant of adrenal mass, alcohol abuse-reports she only has 2 beers per day 2 days/week at this time, anxiety, depression, gastroparesis, GERD, hiatal hernia, history of Billroth II operation, hypertension, lupus, Schatzki's ring, sickle cell trait, stroke, heart murmur, and more presents ED with a chief complaint of nausea and vomiting.   Patient reports she had nausea vomiting diarrhea and abdominal pain for 3 days.  She describes abdominal pain as burning pain.  Its been constant.  She has had 6-7 episodes of emesis per day.  She reports no hematemesis nor coffee-ground emesis.  Any p.o. intake makes her nausea worse.  She is also had diarrhea 6-7 times a day.  No melena or hematochezia.  Neither the act of throwing up or having diarrhea relieve her abdominal pain.  She has not tried any meds at home for her abdominal pain because she has been so nauseous.  Patient reports she does have chronic nausea and vomiting, but she came into the ED because her pain was worse.  She reports she has been hurting and burning and she had surgery 1 month ago.  Her surgery was for an incarcerated hernia in [place] 1 month ago.  Patient reports that she has had subjective fever and chills.  Her last normal  bowel movement was last week.  Her last normal meal was last week.  She had a decreased appetite.  She is trying to eat, but then it all comes right back up.  She has not even been able to keep fluids down.  Patient reports she has a history of A-fib but she has had intermittent palpitations.  Patient has no other complaints at this time.   Patient currently smokes 5 cigarettes/day.  She declines nicotine patch at this time.  She reports she is trying to quit.  She drinks 2 beers twice per week.  She does not use illicit drugs.  She is vaccinated for COVID.  Patient is full code.  ED Temp 97.9, heart rate 98.1, respiratory rate 17-18, blood pressure 136/90-143/105 No leukocytosis at 5.9, hemoglobin 11.8 Chemistry is unremarkable aside from a decreased bicarb at 21 CT abdomen pelvis shows a 6 x 1 fluid collection in the rectus sheath that could be abscess or seroma with overlying cellulitis Fentanyl, Zofran, vancomycin given in the ED General surgery was consulted and reports they will see patient in the a.m. Admission requested for abdominal pain with abnormal CT  Assessment and Plan: * Abdominal pain -hx of multiple abdominal surgeries -CT abdomen shows 6cmx1cm fluid collection - seroma vs abscess with overlying cellulitis -Continue Vancomycin -Start zosyn -Status post evaluation by general surgery Dr. Arnoldo Morale who appreciated the CT evaluation revealing 1.3 x 6 cm fluid collection in the rectus sheath he attempted to aspirate with  needle with no success. Stating this is most likely postoperative seroma/hematoma..  With extensive history abdominal surgeries and recurrent seroma in abdominal wall. No further intervention was recommended, continuing antibiotic for next 24 hours  -IV antibiotics was switched to p.o. Bactrim double strength for 5 more days per Dr. Arnoldo Morale recommendations  -Pain control -Tolerating p.o. diet   Paroxysmal atrial fibrillation ([place]) -Pharmacy medication revealed  that patient has been off Cardizem for over a year now Rate controlled with beta-blockers -on [place]a  -No other anticoagulation  Cellulitis -Very mild, no signs of erythema edema and abdominal, postsurgical site (status post ventral herniaorrhaphy with probable mesh placement a month ago) -continue vancomycin--discontinue switch to p.o. Bactrim   Mixed hyperlipidemia -cont. crestor  Essential hypertension -Continue lisinopril, HCTZ,, norvasc, atenolol,  -Patient is off Cardizem    Hypokalemia - Potassium 3.4, repleted with p.o. supplement kcl   Post-operative wound abscess Per CT scan 1.3 x 6 cm fluid collection in rectus sheath Likely seroma versus hematoma... Postop hernia with mesh repair over a month ago in outside facility. Status post surgery evaluation by Dr. Arnoldo Morale attempted needle aspiration with no results Recommended conservative management-   Tobacco use disorder -5 ciggs per day -Counseled on the importance of cessation -declines patch at this time        Pain control - [place] was reviewed. and patient was instructed, not to drive, operate heavy machinery, perform activities at heights, swimming or participation in water activities or provide baby-sitting services while on Pain, Sleep and Anxiety Medications; until their outpatient Physician has advised to do so again. Also recommended to not to take more than prescribed Pain, Sleep and Anxiety Medications.  Consultants: Gen Surgery  Procedures performed: needle Aspiration   Disposition: Home Diet recommendation:  Discharge Diet Orders (From admission, onward)     Start     Ordered   10/07/21 0000  Diet - low sodium heart healthy        10/07/21 2637           Regular diet DISCHARGE MEDICATION: Allergies as of 10/07/2021       Reactions   Metoclopramide Other (See Comments), Anaphylaxis   EPS symptoms  Jerking movements   Codeine Hives    Reglan [metoclopramide] Other (See Comments)   EPS symptoms         Medication List     STOP taking these medications    baclofen 10 MG tablet Commonly known as: LIORESAL   diltiazem 240 MG 24 hr capsule Commonly known as: CARDIZEM CD   methocarbamol 500 MG tablet Commonly known as: ROBAXIN       TAKE these medications    amLODipine 10 MG tablet Commonly known as: NORVASC Take 10 mg by mouth daily.   aspirin EC 81 MG tablet Take 1 tablet (81 mg total) by mouth daily.   atenolol 50 MG tablet Commonly known as: TENORMIN Take 50 mg by mouth daily.   DULoxetine 30 MG capsule Commonly known as: CYMBALTA Take 30 mg by mouth 2 (two) times daily.   esomeprazole 40 MG capsule Commonly known as: NexIUM Take 1 capsule (40 mg total) by mouth 2 (two) times daily before a meal. What changed: when to take this   hydroxychloroquine 200 MG tablet Commonly known as: PLAQUENIL Take 1.5 tablets (300 mg total) by mouth daily.   isosorbide mononitrate 60 MG 24 hr tablet Commonly known as: IMDUR TAKE 1/2 TABLET BY MOUTH DAILY  lactobacillus acidophilus & bulgar chewable tablet Chew 1 tablet by mouth 3 (three) times daily with meals for 5 days.   lisinopril-hydrochlorothiazide 20-12.5 MG tablet Commonly known as: ZESTORETIC Take 1 tablet by mouth every morning.   mirtazapine 15 MG tablet Commonly known as: REMERON Take 15 mg by mouth at bedtime as needed.   nitroGLYCERIN 0.4 MG SL tablet Commonly known as: NITROSTAT Place 1 tablet (0.4 mg total) under the tongue every 5 (five) minutes as needed for chest pain.   Nurtec 75 MG Tbdp Generic drug: Rimegepant Sulfate Take 75 mg by mouth as needed.   oxyCODONE 5 MG immediate release tablet Commonly known as: Oxy IR/ROXICODONE Take 1 tablet (5 mg total) by mouth every 4 (four) hours as needed for up to 3 days. What changed: reasons to take this   promethazine 12.5 MG tablet Commonly known as: PHENERGAN Take 12.5 mg by  mouth every 6 (six) hours as needed for nausea or vomiting.   rosuvastatin 20 MG tablet Commonly known as: CRESTOR Take 1 tablet (20 mg total) by mouth daily.   sulfamethoxazole-trimethoprim 800-160 MG tablet Commonly known as: BACTRIM DS Take 1 tablet by mouth 2 (two) times daily for 5 days.   thiamine 100 MG tablet Take 100 mg by mouth daily.        Discharge Exam: Filed Weights   10/05/21 2043  Weight: 69.4 kg      Physical Exam:   General:  AAO x 3,  cooperative, no distress;   HEENT:  Normocephalic, PERRL, otherwise with in Normal limits   Neuro:  CNII-XII intact. , normal motor and sensation, reflexes intact   Lungs:   Clear to auscultation BL, Respirations unlabored,  No wheezes / crackles  Cardio:    S1/S2, RRR, No murmure, No Rubs or Gallops   Abdomen:  Soft, non-tender, bowel sounds active all four quadrants, no guarding or peritoneal signs.  Muscular  skeletal:  Limited exam -global generalized weaknesses - in bed, able to move all 4 extremities,   2+ pulses,  symmetric, No pitting edema  Skin:  Dry, warm to touch, negative for any Rashes,  Wounds: Please see nursing documentation          Condition at discharge: good  The results of significant diagnostics from this hospitalization (including imaging, microbiology, ancillary and laboratory) are listed below for reference.   Imaging Studies: CT ABDOMEN PELVIS W CONTRAST  Result Date: 10/05/2021 CLINICAL DATA:  Abdominal pain with nausea, vomiting and diarrhea. EXAM: CT ABDOMEN AND PELVIS WITH CONTRAST TECHNIQUE: Multidetector CT imaging of the abdomen and pelvis was performed using the standard protocol following bolus administration of intravenous contrast. RADIATION DOSE REDUCTION: This exam was performed according to the departmental dose-optimization program which includes automated exposure control, adjustment of the mA and/or kV according to patient size and/or use of iterative reconstruction  technique. CONTRAST:  161m OMNIPAQUE IOHEXOL 300 MG/ML  SOLN COMPARISON:  Aug 15, 2020 FINDINGS: Lower chest: No acute abnormality. Hepatobiliary: There is diffuse fatty infiltration of the liver parenchyma. No focal liver abnormality is seen. Status post cholecystectomy. Common bile duct measures 8 mm in diameter. Pancreas: Unremarkable. No pancreatic ductal dilatation or surrounding inflammatory changes. Spleen: Normal in size without focal abnormality. Adrenals/Urinary Tract: A stable 1.8 cm x 1.8 cm low-attenuation left adrenal mass is seen. Multiple surgical clips are seen within the expected region of the right adrenal gland. Kidneys are normal in size, without renal calculi or focal lesions. A mildly prominent extrarenal pelvis  is seen on the right. Bladder is unremarkable. Stomach/Bowel: Surgical sutures are seen within the gastric region. Surgical clips are seen along the gastroesophageal junction. Surgically anastomosed bowel is seen along the anterior aspect of the mid to upper left abdomen and anterior aspect of the right lower quadrant. The appendix is not clearly identified. No evidence of bowel dilatation. Vascular/Lymphatic: Aortic atherosclerosis. No enlarged abdominal or pelvic lymph nodes. Reproductive: Status post hysterectomy. No adnexal masses. Other: A 6.1 cm by 1.3 cm collection of fluid is seen along the inferior aspect of the right rectus sheath (axial CT images 52 through 57, CT series 2). A mild-to-moderate amount of associated subcutaneous inflammatory fat stranding is noted. A ventral hernia is seen within this region on the prior study. A 3.6 cm x 1.7 cm fat containing ventral hernia is seen just above the level of the umbilicus, to the left of midline. Musculoskeletal: No acute or significant osseous findings. IMPRESSION: 1. 6.1 cm x 1.3 cm collection of fluid along the inferior aspect of the right rectus sheath which may represent an abscess versus postoperative seroma, with  associated moderate severity cellulitis involving the adjacent portion of the abdominal wall. 2. Hepatic steatosis. 3. Stable left adrenal adenoma. 4. Extensive postoperative changes, as described above, with evidence of prior cholecystectomy and hysterectomy. 5. Aortic atherosclerosis. Aortic Atherosclerosis (ICD10-I70.0). Electronically Signed   By: Virgina Norfolk M.D.   On: 10/05/2021 22:47    Microbiology: Results for orders placed or performed during the hospital encounter of 10/05/21  MRSA Next Gen by PCR, Nasal     Status: None   Collection Time: 10/06/21  1:42 AM   Specimen: Urine, Unspecified Source; Nasal Swab  Result Value Ref Range Status   MRSA by PCR Next Gen NOT DETECTED NOT DETECTED Final    Comment: (NOTE) The GeneXpert MRSA Assay (FDA approved for NASAL specimens only), is one component of a comprehensive MRSA colonization surveillance program. It is not intended to diagnose MRSA infection nor to guide or monitor treatment for MRSA infections. Test performance is not FDA approved in patients less than 22 years old. Performed at [facility], [address] [place], [place] [address]     Labs: CBC: Recent Labs  Lab 10/05/21 2150 10/06/21 0555  WBC 5.9 7.5  NEUTROABS 1.8 2.8  HGB 11.8* 11.3*  HCT 33.5* 32.8*  MCV 72.2* 74.0*  PLT 337 732   Basic Metabolic Panel: Recent Labs  Lab 10/05/21 2110 10/06/21 0555  NA 139 140  K 4.4 3.4*  CL 109 111  CO2 21* 20*  GLUCOSE 94 97  BUN 7 7  CREATININE 0.76 0.65  CALCIUM 9.0 8.8*  MG  --  2.0   Liver Function Tests: Recent Labs  Lab 10/05/21 2110 10/06/21 0555  AST 23 95*  ALT 16 23  ALKPHOS 72 69  BILITOT 0.7 0.8  PROT 7.7 7.0  ALBUMIN 4.0 3.7   CBG: No results for input(s): "GLUCAP" in the last 168 hours.  Discharge time spent: greater than 30 minutes.  Signed: Deatra James, MD [facility] 10/07/2021

## 2021-10-07 NOTE — Plan of Care (Signed)

## 2021-10-07 NOTE — Progress Notes (Signed)
Subjective: No new complaints.  Patient is hungry.  Objective: Vital signs in last 24 hours: Temp:  [97.7 F (36.5 C)-98 F (36.7 C)] 97.7 F (36.5 C) (07/23 0500) Pulse Rate:  [59-70] 64 (07/23 0500) BP: (93-98)/(71-75) 98/75 (07/23 0500) SpO2:  [94 %-97 %] 94 % (07/23 0500) Last BM Date : 10/05/21  Intake/Output from previous day: 07/22 0701 - 07/23 0700 In: 2703.6 [P.O.:2160; I.V.:106.6; IV Piggyback:437] Out: 2300 [Urine:2300] Intake/Output this shift: No intake/output data recorded.  General appearance: alert, cooperative, and no distress GI: Soft with mild tenderness around the surgical incision site.  No significant fluctuance or erythema noted.  Surgical incision well-healed.  Lab Results:  Recent Labs    10/05/21 2150 10/06/21 0555  WBC 5.9 7.5  HGB 11.8* 11.3*  HCT 33.5* 32.8*  PLT 337 348   BMET Recent Labs    10/05/21 2110 10/06/21 0555  NA 139 140  K 4.4 3.4*  CL 109 111  CO2 21* 20*  GLUCOSE 94 97  BUN 7 7  CREATININE 0.76 0.65  CALCIUM 9.0 8.8*   PT/INR No results for input(s): "LABPROT", "INR" in the last 72 hours.  Studies/Results: CT ABDOMEN PELVIS W CONTRAST  Result Date: 10/05/2021 CLINICAL DATA:  Abdominal pain with nausea, vomiting and diarrhea. EXAM: CT ABDOMEN AND PELVIS WITH CONTRAST TECHNIQUE: Multidetector CT imaging of the abdomen and pelvis was performed using the standard protocol following bolus administration of intravenous contrast. RADIATION DOSE REDUCTION: This exam was performed according to the departmental dose-optimization program which includes automated exposure control, adjustment of the mA and/or kV according to patient size and/or use of iterative reconstruction technique. CONTRAST:  16m OMNIPAQUE IOHEXOL 300 MG/ML  SOLN COMPARISON:  Aug 15, 2020 FINDINGS: Lower chest: No acute abnormality. Hepatobiliary: There is diffuse fatty infiltration of the liver parenchyma. No focal liver abnormality is seen. Status post  cholecystectomy. Common bile duct measures 8 mm in diameter. Pancreas: Unremarkable. No pancreatic ductal dilatation or surrounding inflammatory changes. Spleen: Normal in size without focal abnormality. Adrenals/Urinary Tract: A stable 1.8 cm x 1.8 cm low-attenuation left adrenal mass is seen. Multiple surgical clips are seen within the expected region of the right adrenal gland. Kidneys are normal in size, without renal calculi or focal lesions. A mildly prominent extrarenal pelvis is seen on the right. Bladder is unremarkable. Stomach/Bowel: Surgical sutures are seen within the gastric region. Surgical clips are seen along the gastroesophageal junction. Surgically anastomosed bowel is seen along the anterior aspect of the mid to upper left abdomen and anterior aspect of the right lower quadrant. The appendix is not clearly identified. No evidence of bowel dilatation. Vascular/Lymphatic: Aortic atherosclerosis. No enlarged abdominal or pelvic lymph nodes. Reproductive: Status post hysterectomy. No adnexal masses. Other: A 6.1 cm by 1.3 cm collection of fluid is seen along the inferior aspect of the right rectus sheath (axial CT images 52 through 57, CT series 2). A mild-to-moderate amount of associated subcutaneous inflammatory fat stranding is noted. A ventral hernia is seen within this region on the prior study. A 3.6 cm x 1.7 cm fat containing ventral hernia is seen just above the level of the umbilicus, to the left of midline. Musculoskeletal: No acute or significant osseous findings. IMPRESSION: 1. 6.1 cm x 1.3 cm collection of fluid along the inferior aspect of the right rectus sheath which may represent an abscess versus postoperative seroma, with associated moderate severity cellulitis involving the adjacent portion of the abdominal wall. 2. Hepatic steatosis. 3. Stable left adrenal  adenoma. 4. Extensive postoperative changes, as described above, with evidence of prior cholecystectomy and hysterectomy. 5.  Aortic atherosclerosis. Aortic Atherosclerosis (ICD10-I70.0). Electronically Signed   By: Virgina Norfolk M.D.   On: 10/05/2021 22:47    Anti-infectives: Anti-infectives (From admission, onward)    Start     Dose/Rate Route Frequency Ordered Stop   10/06/21 1200  vancomycin (VANCOREADY) IVPB 750 mg/150 mL        750 mg 150 mL/hr over 60 Minutes Intravenous Every 12 hours 10/05/21 2350     10/06/21 0200  piperacillin-tazobactam (ZOSYN) IVPB 3.375 g        3.375 g 12.5 mL/hr over 240 Minutes Intravenous Every 8 hours 10/06/21 0101     10/05/21 2345  vancomycin (VANCOREADY) IVPB 1250 mg/250 mL        1,250 mg 166.7 mL/hr over 90 Minutes Intravenous  Once 10/05/21 2334 10/06/21 0052   10/05/21 2315  vancomycin (VANCOCIN) IVPB 1000 mg/200 mL premix  Status:  Discontinued        1,000 mg 200 mL/hr over 60 Minutes Intravenous  Once 10/05/21 2301 10/05/21 2334       Assessment/Plan: Impression: No significant postsurgical abscess present.  This is probably a seroma/hematoma.  No need for further intervention at this time.  Would discharge home on 5 more days of p.o. antibiotic.  This should cover MRSA given her frequent hospitalizations.  She was instructed to follow-up with her primary surgeon next week for continued care.  LOS: 1 day    [facility] Signs 10/07/2021

## 2021-10-07 NOTE — Progress Notes (Signed)
AVS given and reviewed with pt. Medications discussed. All questions answered to satisfaction. Pt verbalized understanding of information given. Pt to be escorted off the unit with all belongings via wheelchair by staff member.  

## 2021-10-15 NOTE — [place] Notes (Signed)
Office Visit Note  Patient: Angel Price             Date of Birth: [birth year]           MRN: [id number]             PCP: Carrolyn Meiers, MD Referring: Carrolyn Meiers* Visit Date: 10/24/2021   Subjective:  Follow-up (Feel better than last visit.)   History of Present Illness: Angel Price is a 56 y.o. female here for follow up for lupus or UCTD with inflammatory arthritis on hydroxychloroquine 300 mg daily.  Since her last visit she had some major medical events requiring abdominal surgery for in a car serrated hernia.  She had drain placed that stayed in until recently removed at the end of July.  However overall her symptoms are doing much better she has had improvement in joint pains and no recurrence of the nasal ulcers.   Previous HPI 03/08/2021 Angel Price is a 56 y.o. female here for follow up for lupus or UCTD with inflammatory arthritis after starting hydroxychloroquine 200 mg daily. She feels symptoms are overall improved less difficulty moving around. She still notices some anterior knee pain worse when going up or down stairs. She had one episode of ulcer in her nose on the right side with some drainage currently healed.   Previous HPI 12/28/20 Angel Price is a 56 y.o. female here for follow up for lupus with several ongoing symptoms but most recently having joint pain with some swelling on left elbow wrist and MCPs were seen on exam. Laboratory work-up was consistent with lupus with positive rheumatoid factor slightly low serum complement.  No changes or events since her last visit.    Previous HPI 12/08/20 Angel Price is a 56 y.o. female here for lupus. 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 Previous serology showed positive ANA and RF. Past imaging showing degenerative changes in lumbar spine.  She has not  followed up with any rheumatologist for many years. She recalls use of NSAIDs and steroids and thinks plaquenil sounds familiar but not sure of an other long term lupus treatment. 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  She is currently following with cardiology for angina loss of consciousness no obstructive CAD on extensive work-up including coronary angiography attributed to suspected microvascular dysfunction. She is required abdominal surgeries for gastrointestinal bleeding with dieulafoy lesion of duodenum.  Abdominal mesh surgery for repair of incisional hernia was complicated with infection attributed in part to continue nicotine abuse.     Review of Systems  Constitutional:  Negative for fatigue.  HENT:  Negative for mouth sores and mouth dryness.   Eyes:  Negative for dryness.  Respiratory:  Positive for shortness of breath.   Cardiovascular:  Positive for palpitations. Negative for chest pain.  Gastrointestinal:  Positive for diarrhea. Negative for blood in stool and constipation.  Endocrine: Negative for increased urination.  Genitourinary:  Negative for involuntary urination.  Musculoskeletal:  Positive for joint pain, joint pain, joint swelling, myalgias, muscle weakness, morning stiffness, muscle tenderness and myalgias.  Skin:  Negative for color change, rash, hair loss and sensitivity to sunlight.  Allergic/Immunologic: Negative for susceptible to infections.  Neurological:  Negative for dizziness.  Hematological:  Negative for swollen glands.  Psychiatric/Behavioral:  Negative for  depressed mood and sleep disturbance. The patient is not nervous/anxious.     PMFS History:  Patient Active Problem List   Diagnosis Date Noted   Cellulitis 10/06/2021   Paroxysmal atrial fibrillation ([place]) 10/06/2021   Abdominal fluid collection 10/06/2021   Constipation 09/14/2021   Ventral hernia  05/15/2021   Dysphagia 05/15/2021   Tobacco dependence 09/12/2020   Continuous dependence on cigarette smoking 08/22/2020   Mixed hyperlipidemia 04/27/2020   Recurrent incisional hernia 02/03/2020   Coronary artery disease of native artery of native heart with stable angina pectoris ([place]) 06/25/2019   Acute respiratory failure with hypoxia ([place]) 05/20/2019   Essential hypertension 05/20/2019   COVID-19 05/20/2019   Syncope and collapse 52/77/8242   Metabolic acidosis 35/36/1443   Intractable vomiting with nausea 02/23/2019   Cocaine abuse ([place])    Colitis 11/11/2018   Hemorrhoidal skin tag    Folate deficiency 05/11/2017   Abdominal wall fistula    Cellulitis of abdominal wall 01/02/2017   Acute left hemiparesis ([place]) 12/05/2016   Malingering 12/05/2016   Weakness of left lower extremity 12/02/2016   CVA (cerebral vascular accident) ([place]) 11/23/2016   Bright red rectal bleeding 08/22/2016   Hypotension due to blood loss    Acute GI bleeding 05/24/2016   Dieulafoy lesion of duodenum    History of Billroth II operation    Gastrointestinal hemorrhage 05/22/2016   Absolute anemia    Acute pancreatitis 10/01/2015   Alcohol abuse with intoxication ([place])    Left-sided weakness    Psychosomatic factor in physical condition    Upper GI bleed    Diverticulosis of colon with hemorrhage    Chronic wound infection of abdomen 12/22/2014   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   HTN (hypertension) 12/22/2014   Ataxia 11/01/2014   Hemorrhoids 04/26/2014   Lung nodule 04/26/2014   Diverticulosis    Gastritis    Hiatal hernia    Schatzki's ring    Acute blood loss anemia 04/25/2014   Hypokalemia 04/25/2014   Hematemesis with nausea 04/25/2014   Lung nodule < 6cm on CT 04/25/2014   Intractable nausea and vomiting 04/24/2014   Gastroparesis    Abdominal pain 04/01/2014   Gastroenteritis 12/10/2013   Chronic abdominal wound infection 08/16/2013   MDD (major depressive disorder), recurrent  episode, severe ([place]) 06/27/2013   Wrist laceration 06/24/2013   Diarrhea 05/07/2013   Rectal bleeding 05/07/2013   Adrenal mass, left ([place]) 05/07/2013   Post-operative wound abscess 04/19/2013   Abdominal wall abscess 04/18/2013   Essential hypertension, benign 03/30/2013   History of cocaine abuse ([place]) 03/16/2013   History of schizoaffective disorder 03/16/2013   Bipolar disorder ([place]) 03/16/2013   Personality disorder ([place]) 03/16/2013   Tobacco use disorder 03/16/2013   Alcohol abuse 03/16/2013   Palpitations 03/16/2013   Poor vision 03/16/2013   History of gastric bypass 03/16/2013   Status post hysterectomy with oophorectomy 03/16/2013   Hypothyroid 03/16/2013   Lupus ([place]) 03/16/2013    Past Medical History:  Diagnosis Date   Abscess    soft tissue   Adrenal mass (HCC)    Alcohol abuse    Anemia    Anxiety    Blood transfusion without reported diagnosis    Chronic abdominal pain    Chronic wound infection of abdomen    Colon polyp    colonoscopy 04/2014   Depression    Discoid lupus erythematosus    Diverticulosis    colonoscopy 04/2014 moderat pan colonic   Drug-seeking behavior  Gastritis    EGD 05/2014   Gastroparesis 01/2014   GERD (gastroesophageal reflux disease)    Hemorrhoid    internal large   Hiatal hernia    History of Billroth II operation    Hypertension    Lung nodule    CT 02/2014 needs repeat 1 month   Lung nodule < 6cm on CT 04/25/2014   Lupus (HCC)    Malingering    Migraine    Nausea and vomiting    chronic, recurrent   Pancreatitis    Schatzki's ring    patent per EGD 04/2014   Sickle cell trait ([place])    Stroke ([place])    Suicide attempt ([place])    Thyroid disease 2000   overactive, radiation    Family History  Problem Relation Age of Onset   Drug abuse Mother    Lung cancer Father    Cancer Father        mets   Drug abuse Sister    Drug abuse Brother    Breast cancer Maternal Aunt    Bipolar disorder Maternal Aunt     Drug abuse Maternal Aunt    Colon cancer Maternal Grandmother        late 75s, early 22s   Bipolar disorder Paternal Grandfather    Brain cancer Son    Schizophrenia Son    Cancer Son        brain   Bipolar disorder Cousin    Liver disease Neg Hx    Past Surgical History:  Procedure Laterality Date   ABDOMINAL HYSTERECTOMY  2013   [place]   ABDOMINAL HYSTERECTOMY     ABDOMINAL SURGERY     ADRENALECTOMY Right    AGILE CAPSULE N/A 01/05/2015   Procedure: AGILE CAPSULE;  Surgeon: Daneil Dolin, MD;  [facility];  Service: Endoscopy;  Laterality: N/A;  0700   BALLOON DILATION N/A 10/01/2021   Procedure: BALLOON DILATION;  Surgeon: Eloise Harman, DO;  [facility];  Service: Endoscopy;  Laterality: N/A;   Billroth II procedure      [place], first 2000, 2005/2006.   bilroth 2     BIOPSY  05/20/2013   Procedure: BIOPSIES OF ASCENDING AND SIGMOID COLON;  Surgeon: Daneil Dolin, MD;  Location: [facility];  Service: Endoscopy;;   BIOPSY  04/26/2014   Procedure: BIOPSIES;  Surgeon: Danie Binder, MD;  Location: [facility];  Service: Endoscopy;;   BIOPSY  12/24/2018   Procedure: BIOPSY;  Surgeon: Daneil Dolin, MD;  [facility];  Service: Endoscopy;;  colon   CHOLECYSTECTOMY     COLONOSCOPY     in [place]   COLONOSCOPY WITH PROPOFOL N/A 05/20/2013   Dr.Rourk- inadequate prep, normal appearing rectum, grossly normal colon aside from pancolonic diverticula, normal terminal ileum bx= unremarkable colonic mucosa. Due for early interval 2016.    COLONOSCOPY WITH PROPOFOL N/A 04/26/2014   NWG:NFAOZH ileum/one colon polyp removed/moderate pan-colonic diverticulosis/large internal hemorrhoids   COLONOSCOPY WITH PROPOFOL N/A 12/23/2014   Dr.Rourk- minimal internal hemorrhoids, pancolonic diverticulosis   COLONOSCOPY WITH PROPOFOL N/A 08/23/2016   Procedure: COLONOSCOPY WITH PROPOFOL;  Surgeon: Danie Binder, MD;  [facility];  Service:  Endoscopy;  Laterality: N/A;   COLONOSCOPY WITH PROPOFOL N/A 12/24/2018   Pancolonic diverticulosis, normal TI, one 5 mm polyp in rectum (tubular adenoma). Somewhat friable hemorrhagic mucosa in area of IC valve/cecum, possibly related to scope trauma s/p biopsy. Repeat in 7 years.  DEBRIDEMENT OF ABDOMINAL WALL ABSCESS N/A 02/08/2013   Procedure: DEBRIDEMENT OF ABDOMINAL WALL ABSCESS;  Surgeon: Jamesetta So, MD;  [facility];  Service: General;  Laterality: N/A;   ESOPHAGOGASTRODUODENOSCOPY (EGD) WITH PROPOFOL N/A 05/20/2013   Dr.Rourk- s/p prior gastric surgery with normal esophagus, residual gastric mucosa and patent efferent limb   ESOPHAGOGASTRODUODENOSCOPY (EGD) WITH PROPOFOL N/A 02/03/2014   Dr. Gala Romney:  s/p hemigastrectomy with retained gastric contents. Residual gastric mucosa and efferent limb appeared normal otherwise. Query gastroparesis.    ESOPHAGOGASTRODUODENOSCOPY (EGD) WITH PROPOFOL N/A 04/26/2014   DDU:KGURKYHC'W ring/small HH/mild non-erosive gasrtitis/normal anastomosis   ESOPHAGOGASTRODUODENOSCOPY (EGD) WITH PROPOFOL N/A 12/23/2014   Dr.Rourk- s/p prior hemigastrctomy, active oozing from anastomotic suture site, hemostasis achieved   ESOPHAGOGASTRODUODENOSCOPY (EGD) WITH PROPOFOL N/A 05/23/2016   Dr. 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    ESOPHAGOGASTRODUODENOSCOPY (EGD) WITH PROPOFOL N/A 05/11/2017   Procedure: ESOPHAGOGASTRODUODENOSCOPY (EGD) WITH PROPOFOL;  Surgeon: Daneil Dolin, MD;  [facility];  Service: Endoscopy;  Laterality: N/A;   ESOPHAGOGASTRODUODENOSCOPY (EGD) WITH PROPOFOL N/A 06/07/2021   Procedure: ESOPHAGOGASTRODUODENOSCOPY (EGD) WITH PROPOFOL;  Surgeon: Daneil Dolin, MD;  [facility];  Service: Endoscopy;  Laterality: N/A;  11:45am   ESOPHAGOGASTRODUODENOSCOPY (EGD) WITH PROPOFOL N/A 10/01/2021   Procedure:  ESOPHAGOGASTRODUODENOSCOPY (EGD) WITH PROPOFOL;  Surgeon: Eloise Harman, DO;  [facility];  Service: Endoscopy;  Laterality: N/A;  3:00pm   EXCISION OF MESH  08/2019   HEMORRHOID SURGERY N/A 06/18/2017   Procedure: THREE COLUMN EXTENSIVE HEMORRHOIDECTOMY;  Surgeon: Virl Cagey, MD;  Location: [facility];  Service: General;  Laterality: N/A;   INCISION AND DRAINAGE ABSCESS N/A 01/06/2017   Procedure: INCISION AND DRAINAGE ABDOMINAL WALL ABSCESS;  Surgeon: Aviva Signs, MD;  Location: [facility];  Service: General;  Laterality: N/A;   incisional hernial repair  09/05/2021   [place]   LEFT HEART CATH AND CORONARY ANGIOGRAPHY N/A 05/09/2020   Procedure: LEFT HEART CATH AND CORONARY ANGIOGRAPHY;  Surgeon: Nigel Mormon, MD;  Location: [facility];  Service: Cardiovascular;  Laterality: N/A;   POLYPECTOMY  12/24/2018   Procedure: POLYPECTOMY;  Surgeon: Daneil Dolin, MD;  [facility];  Service: Endoscopy;;  colon   tendon repar Right    wrist   WOUND EXPLORATION Right 06/24/2013   Procedure: exploration of traumatic wound right wrist;  Surgeon: Tennis Must, MD;  Location: [facility];  Service: Orthopedics;  Laterality: Right;   Social History   Social History Narrative   Caffeine- tea occass   Immunization History  Administered Date(s) Administered   Influenza,inj,Quad PF,6+ Mos 02/08/2013, 04/02/2014, 12/23/2014, 12/03/2016   Influenza-Unspecified 02/08/2013, 04/02/2014, 12/23/2014, 12/03/2016   Pneumococcal Polysaccharide-23 06/25/2013, 12/03/2016, 11/12/2018   Tdap 03/16/2013, 06/24/2013     Objective: Vital Signs: BP 129/88 (BP Location: Left Arm, Patient Position: Sitting, Cuff Size: Normal)   Pulse 84   Resp 15   Ht '5\' 3"'$  (1.6 m)   Wt 155 lb 9.6 oz (70.6 kg)   LMP  (LMP Unknown)   BMI 27.56 kg/m    Physical Exam HENT:     Mouth/Throat:     Mouth: Mucous membranes are moist.     Pharynx: Oropharynx is clear.  Eyes:      Conjunctiva/sclera: Conjunctivae normal.  Cardiovascular:     Rate and Rhythm: Normal rate and regular rhythm.  Pulmonary:     Effort:  Pulmonary effort is normal.     Breath sounds: Normal breath sounds.  Musculoskeletal:     Right lower leg: No edema.     Left lower leg: No edema.  Skin:    General: Skin is warm and dry.     Findings: No rash.  Neurological:     Mental Status: She is alert.  Psychiatric:        Mood and Affect: Mood normal.      Musculoskeletal Exam:  Shoulders full ROM no tenderness or swelling Elbows full ROM no tenderness or swelling Wrists full ROM no tenderness or swelling Fingers full ROM no tenderness or swelling Knees full ROM no tenderness or swelling Ankles full ROM no tenderness or swelling  Investigation: No additional findings.  Imaging: CT ABDOMEN PELVIS W CONTRAST  Result Date: 10/05/2021 CLINICAL DATA:  Abdominal pain with nausea, vomiting and diarrhea. EXAM: CT ABDOMEN AND PELVIS WITH CONTRAST TECHNIQUE: Multidetector CT imaging of the abdomen and pelvis was performed using the standard protocol following bolus administration of intravenous contrast. RADIATION DOSE REDUCTION: This exam was performed according to the departmental dose-optimization program which includes automated exposure control, adjustment of the mA and/or kV according to patient size and/or use of iterative reconstruction technique. CONTRAST:  178m OMNIPAQUE IOHEXOL 300 MG/ML  SOLN COMPARISON:  Aug 15, 2020 FINDINGS: Lower chest: No acute abnormality. Hepatobiliary: There is diffuse fatty infiltration of the liver parenchyma. No focal liver abnormality is seen. Status post cholecystectomy. Common bile duct measures 8 mm in diameter. Pancreas: Unremarkable. No pancreatic ductal dilatation or surrounding inflammatory changes. Spleen: Normal in size without focal abnormality. Adrenals/Urinary Tract: A stable 1.8 cm x 1.8 cm low-attenuation left adrenal mass is seen. Multiple  surgical clips are seen within the expected region of the right adrenal gland. Kidneys are normal in size, without renal calculi or focal lesions. A mildly prominent extrarenal pelvis is seen on the right. Bladder is unremarkable. Stomach/Bowel: Surgical sutures are seen within the gastric region. Surgical clips are seen along the gastroesophageal junction. Surgically anastomosed bowel is seen along the anterior aspect of the mid to upper left abdomen and anterior aspect of the right lower quadrant. The appendix is not clearly identified. No evidence of bowel dilatation. Vascular/Lymphatic: Aortic atherosclerosis. No enlarged abdominal or pelvic lymph nodes. Reproductive: Status post hysterectomy. No adnexal masses. Other: A 6.1 cm by 1.3 cm collection of fluid is seen along the inferior aspect of the right rectus sheath (axial CT images 52 through 57, CT series 2). A mild-to-moderate amount of associated subcutaneous inflammatory fat stranding is noted. A ventral hernia is seen within this region on the prior study. A 3.6 cm x 1.7 cm fat containing ventral hernia is seen just above the level of the umbilicus, to the left of midline. Musculoskeletal: No acute or significant osseous findings. IMPRESSION: 1. 6.1 cm x 1.3 cm collection of fluid along the inferior aspect of the right rectus sheath which may represent an abscess versus postoperative seroma, with associated moderate severity cellulitis involving the adjacent portion of the abdominal wall. 2. Hepatic steatosis. 3. Stable left adrenal adenoma. 4. Extensive postoperative changes, as described above, with evidence of prior cholecystectomy and hysterectomy. 5. Aortic atherosclerosis. Aortic Atherosclerosis (ICD10-I70.0). Electronically Signed   By: TVirgina NorfolkM.D.   On: 10/05/2021 22:47    Recent Labs: Lab Results  Component Value Date   WBC 7.5 10/06/2021   HGB 11.3 (L) 10/06/2021   PLT 348 10/06/2021   NA 140 10/06/2021  K 3.4 (L) 10/06/2021    CL 111 10/06/2021   CO2 20 (L) 10/06/2021   GLUCOSE 97 10/06/2021   BUN 7 10/06/2021   CREATININE 0.65 10/06/2021   BILITOT 0.8 10/06/2021   ALKPHOS 69 10/06/2021   AST 95 (H) 10/06/2021   ALT 23 10/06/2021   PROT 7.0 10/06/2021   ALBUMIN 3.7 10/06/2021   CALCIUM 8.8 (L) 10/06/2021   GFRAA 119 05/05/2020    Speciality Comments: PLQ Eye Exam 08/10/2021 [place] f/u 1 month  Procedures:  No procedures performed Allergies: Metoclopramide, Codeine, and Reglan [metoclopramide]   Assessment / Plan:     Visit Diagnoses: Lupus ([place]) - Plan: C3 and C4, Anti-DNA antibody, double-stranded, Sedimentation rate, Rheumatoid factor  Clinically appears to be doing extremely well has benefited with the hydroxychloroquine dose increased from 200 to 300 mg daily.  Plan to continue this 300 mg dose.  Checking sedimentation rate serum complements and double-stranded DNA antibody for disease activity monitoring.  High risk medication use - hydroxychloroquine (PLAQUENIL) 200 MG tablet, 1.5 tablets (300 mg total) by mouth daily  Tolerating the medicine fine and she had recent hydroxychloroquine eye exam in May.  Anemia, unspecified type  Mild and stable she had recent labs including CBC at the hospital on July 22.  Orders: Orders Placed This Encounter  Procedures   C3 and C4   Anti-DNA antibody, double-stranded   Sedimentation rate   Rheumatoid factor   No orders of the defined types were placed in this encounter.    Follow-Up Instructions: Return in about 6 months (around 04/26/2022) for SLE on HCQ f/u 6 mos.   Collier Salina, MD  Note - This record has been created using [employer].  Chart creation errors have been sought, but may not always  have been located. Such creation errors do not reflect on  the standard of medical care.

## 2021-10-24 ENCOUNTER — Ambulatory Visit: Payer: Medicare Other | Attending: Internal Medicine | Admitting: Internal Medicine

## 2021-10-24 ENCOUNTER — Encounter: Payer: Self-pay | Admitting: Internal Medicine

## 2021-10-24 VITALS — BP 129/88 | HR 84 | Resp 15 | Ht 63.0 in | Wt 155.6 lb

## 2021-10-24 DIAGNOSIS — D649 Anemia, unspecified: Secondary | ICD-10-CM | POA: Diagnosis not present

## 2021-10-24 DIAGNOSIS — M329 Systemic lupus erythematosus, unspecified: Secondary | ICD-10-CM

## 2021-10-24 DIAGNOSIS — Z79899 Other long term (current) drug therapy: Secondary | ICD-10-CM | POA: Diagnosis not present

## 2021-10-26 ENCOUNTER — Telehealth: Payer: Self-pay | Admitting: *Deleted

## 2021-10-26 LAB — SEDIMENTATION RATE: Sed Rate: 14 mm/h (ref 0–30)

## 2021-10-26 LAB — ANTI-DNA ANTIBODY, DOUBLE-STRANDED: ds DNA Ab: <1 IU/mL

## 2021-10-26 LAB — C3 AND C4
C3 Complement: 145 mg/dL (ref 83–193)
C4 Complement: 16 mg/dL (ref 15–57)

## 2021-10-26 LAB — RHEUMATOID FACTOR: Rheumatoid fact SerPl-aCnc: 19 IU/mL — ABNORMAL HIGH (ref <14)

## 2021-10-26 NOTE — Telephone Encounter (Signed)
Patient returned call to the office and left message for you to call back regarding lab results.

## 2021-10-26 NOTE — Telephone Encounter (Signed)
Advised patient that lab results look great her sedimentation rate and other markers of lupus activity are good. Dr. Benjamine Mola recommend we continue the current hydroxychloroquine.

## 2021-10-26 NOTE — [place] Notes (Signed)
Lab results look great her sedimentation rate and other markers of lupus activity are good. I recommend we continue the current hydroxychloroquine.

## 2021-12-21 IMAGING — CT CT ABD-PELV W/O CM
2 of 4 series · 16 of 46 positions shown, 18 images · non-contrast
Comparison: CT abdomen and pelvis 11/16/2019.

CLINICAL DATA: Abdominal pain, nausea and vomiting for 3 days. The
patient heard a popping sound in her abdomen getting out of bed
today.

EXAM:
CT ABDOMEN AND PELVIS WITHOUT CONTRAST
TECHNIQUE: Multidetector CT imaging of the abdomen and pelvis was performed
following the standard protocol without IV contrast.

[Series 2: axial st · axial · 0.85mm/px · z∈[-872,-457]mm · 13 of 93 slices shown, 15 images]
[im 5/93  soft-tissue]
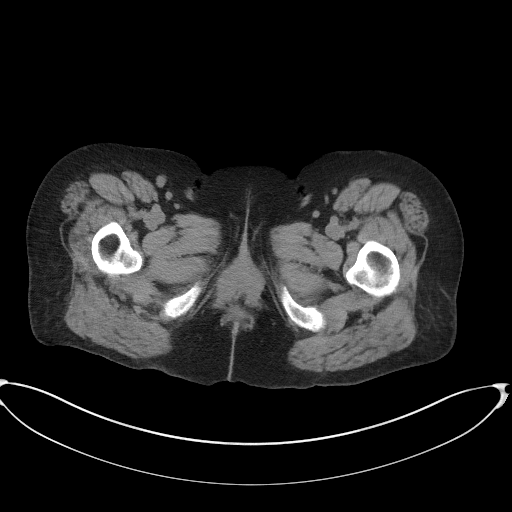
[im 5/93  bone]
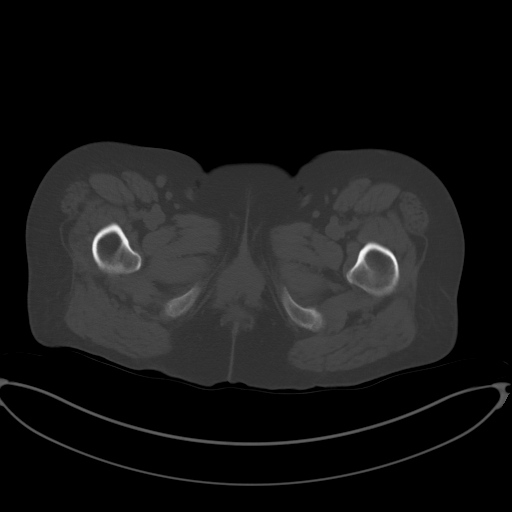
[im 15/93  soft-tissue]
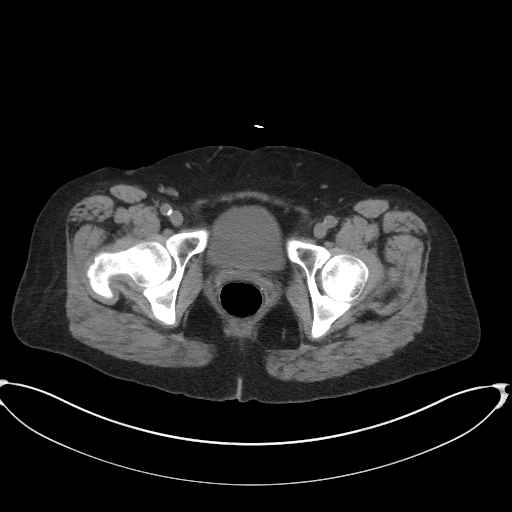
[im 20/93  soft-tissue]
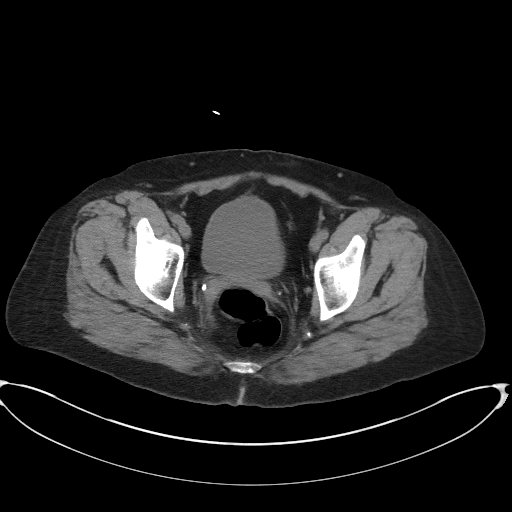
[im 25/93  soft-tissue]
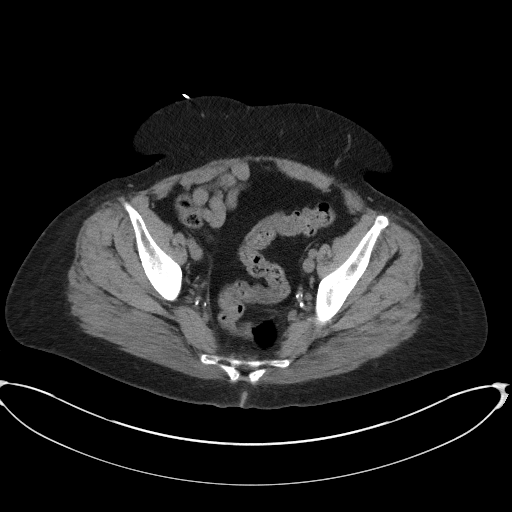
[im 34/93  soft-tissue]
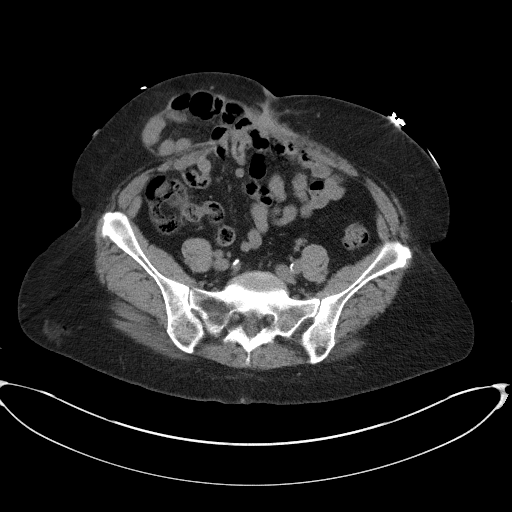
[im 39/93  soft-tissue]
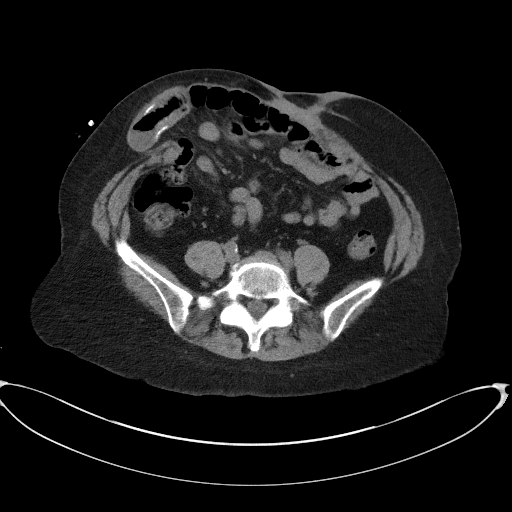
[im 49/93  soft-tissue]
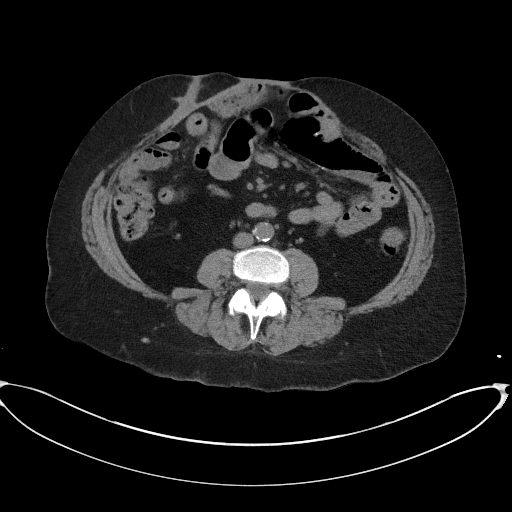
[im 54/93  soft-tissue]
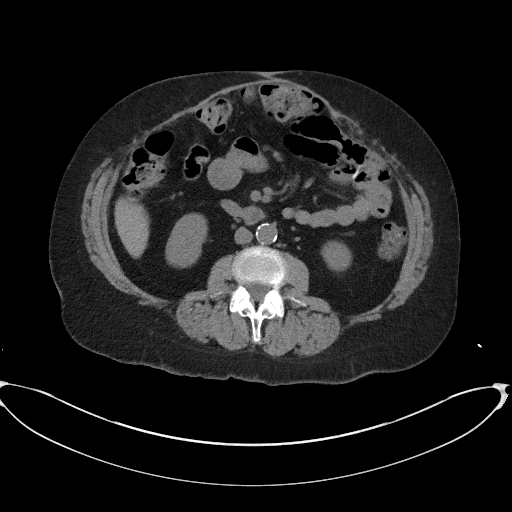
[im 59/93  soft-tissue]
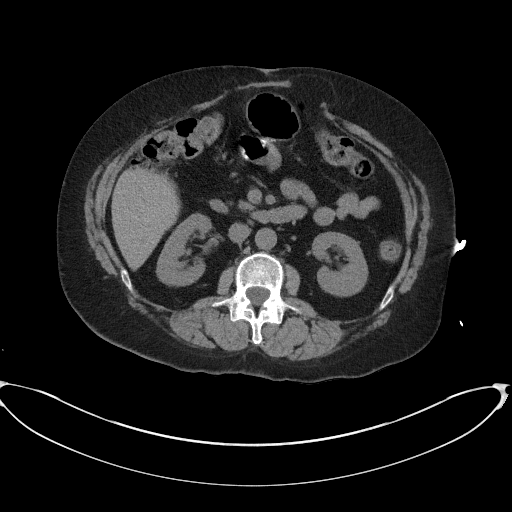
[im 59/93  bone]
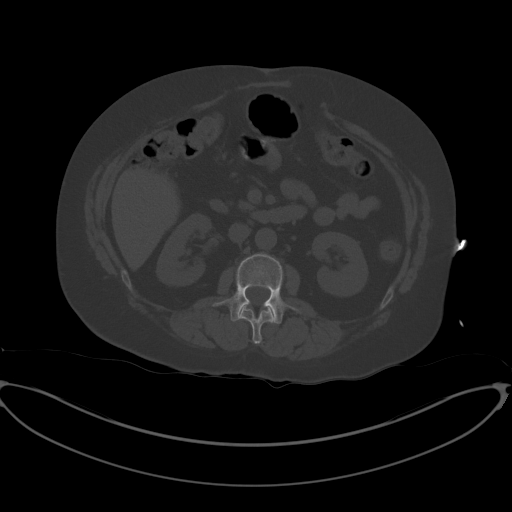
[im 68/93  soft-tissue]
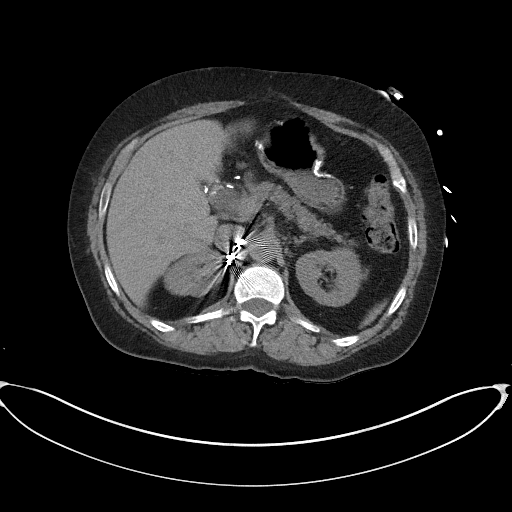
[im 73/93  soft-tissue]
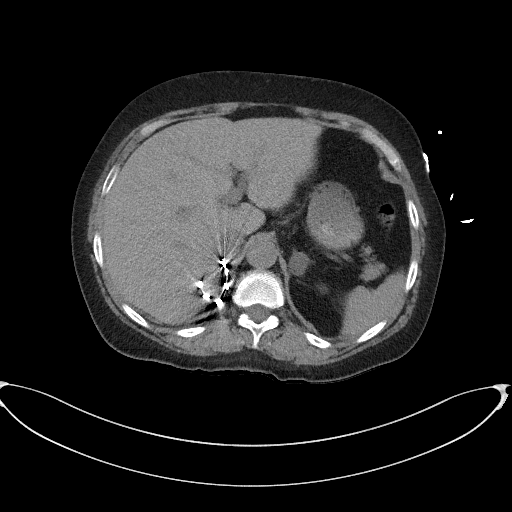
[im 78/93  soft-tissue]
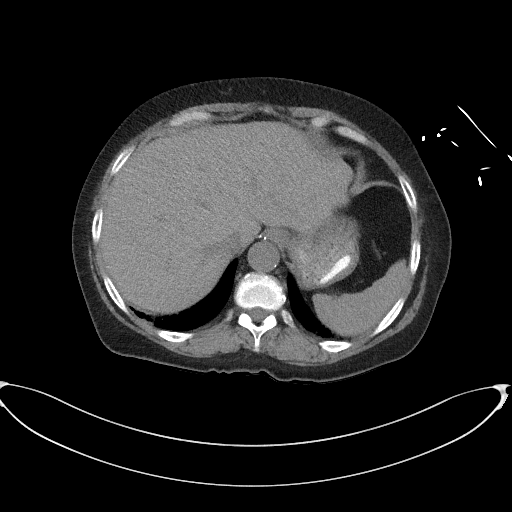
[im 88/93  soft-tissue]
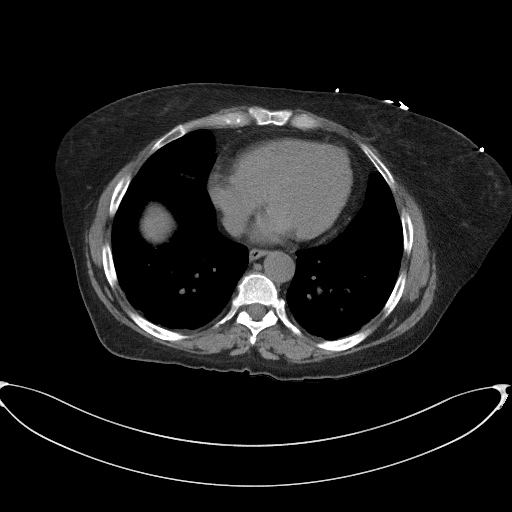

[Series 5: coronal st · coronal · 0.78mm/px · 3 of 101 slices shown]
[im 34/101  soft-tissue]
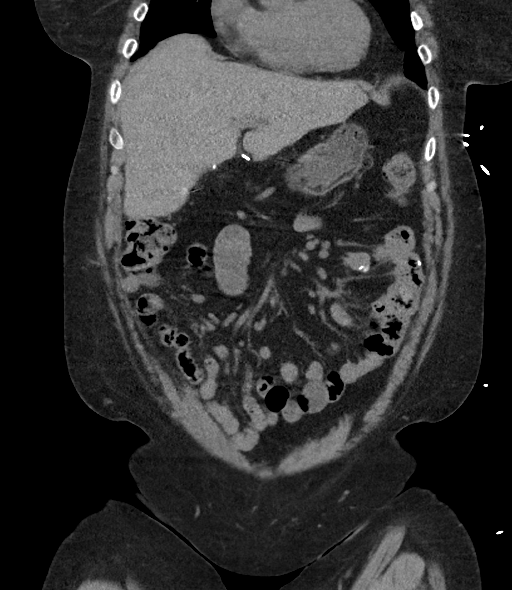
[im 45/101  soft-tissue]
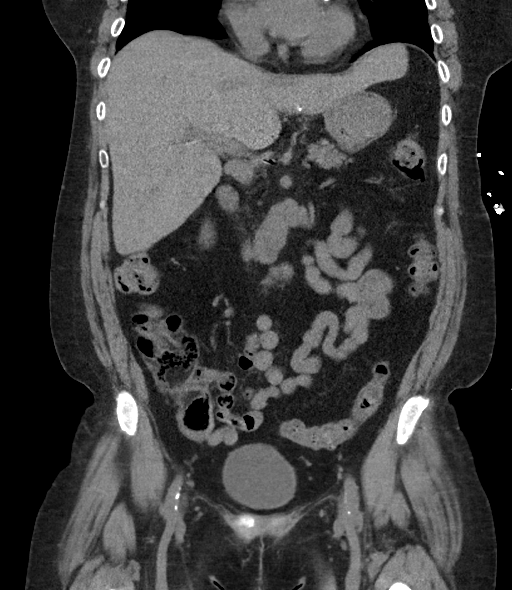
[im 56/101  soft-tissue]
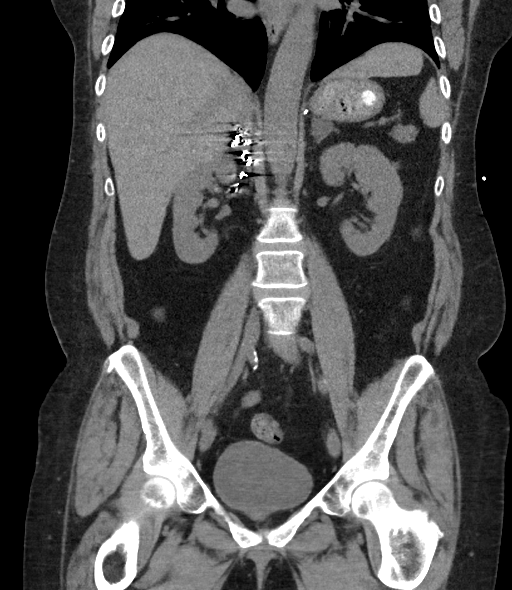

[16 of 46 positions shown; findings below may reference images not displayed]

FINDINGS: Lower chest: Mild dependent atelectasis. Lung bases otherwise clear.
No pleural or pericardial effusion.

Hepatobiliary: No focal liver abnormality is seen. Status post
cholecystectomy. No biliary dilatation.

Pancreas: Unremarkable. No pancreatic ductal dilatation or
surrounding inflammatory changes.

Spleen: Normal in size without focal abnormality.

Adrenals/Urinary Tract: Left adrenal adenoma is unchanged. The right
adrenal gland has been removed. Multiple surgical staples are
present above the right kidney. The kidneys, ureters and urinary
bladder appear normal.

Stomach/Bowel: Again seen is postoperative change partial
gastrectomy gastrojejunostomy. There is no evidence of bowel
obstruction. Wall thickening about the enterostomy site in the left
abdomen is identified and could be due to distension or possibly
inflammatory change. As on the prior exam, some small bowel loops
appear adhesed to the anterior abdominal wall. No pneumatosis or
portal venous gas. The appendix is not seen and may have been
removed.

Vascular/Lymphatic: Aortic atherosclerosis. No enlarged abdominal or
pelvic lymph nodes.

Reproductive: Status post hysterectomy. No adnexal masses.

Other: Since the prior examination, the patient has developed a
hernia to the right of midline which contains loops of small and
large bowel without obstruction. The defect measures approximately 5
cm craniocaudal by 11 cm transverse. No ascites or focal fluid
collection.

Musculoskeletal: No acute focal abnormality.
IMPRESSION: The patient has a new right side abdominal wall ventral hernia
containing loops of small large bowel without obstruction.

Mild wall thickening about the patient's enterostomy site is likely
due to under distension but could reflect inflammatory change. Loop
of bowel at the enterostomy is within the patient's new hernia.

No change in a benign left adrenal adenoma.

Postoperative change as described above.

Aortic Atherosclerosis ([id number].I).

## 2022-01-04 IMAGING — DX DG ABDOMEN ACUTE W/ 1V CHEST
4 series · 4 of 4 positions shown · non-contrast
Comparison: 11/10/2019

CLINICAL DATA: Abdominal pain and hernia

EXAM:
DG ABDOMEN ACUTE WITH 1 VIEW CHEST

[chest pa]
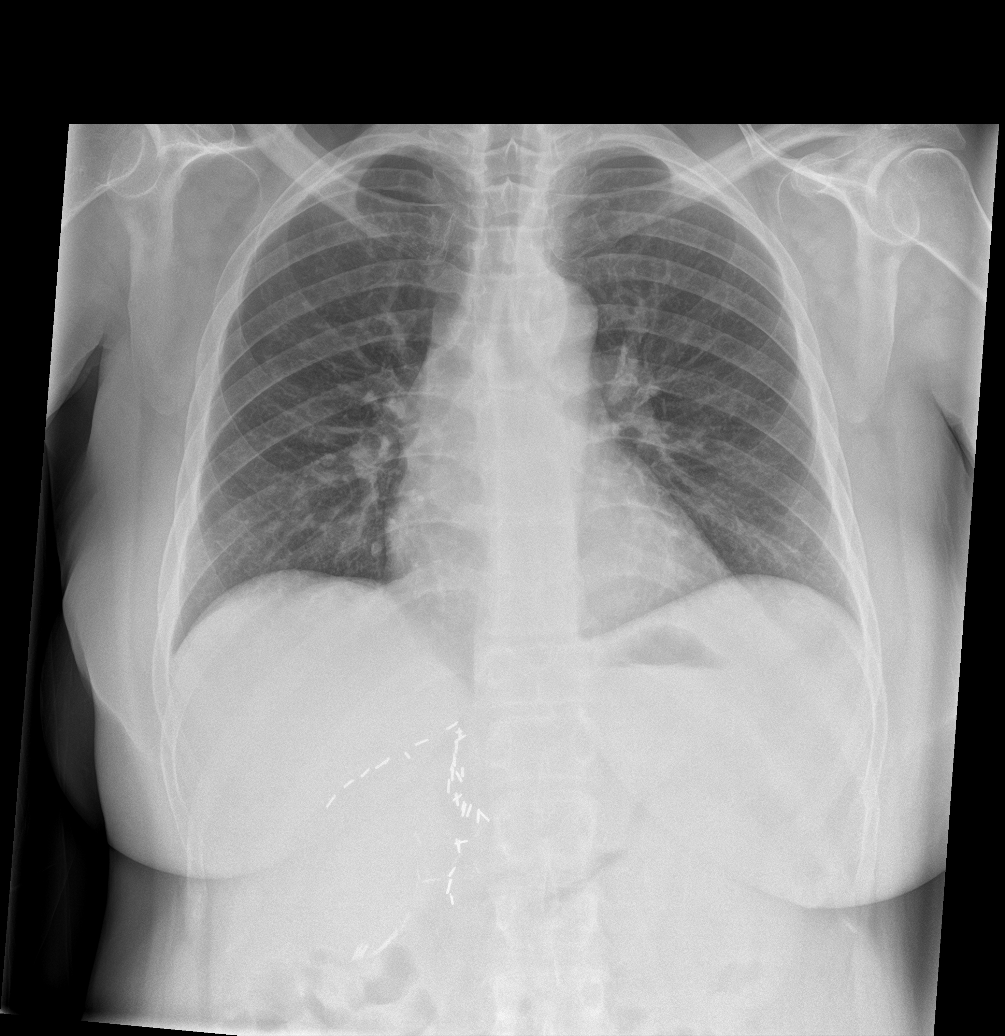

[abdomen erect]
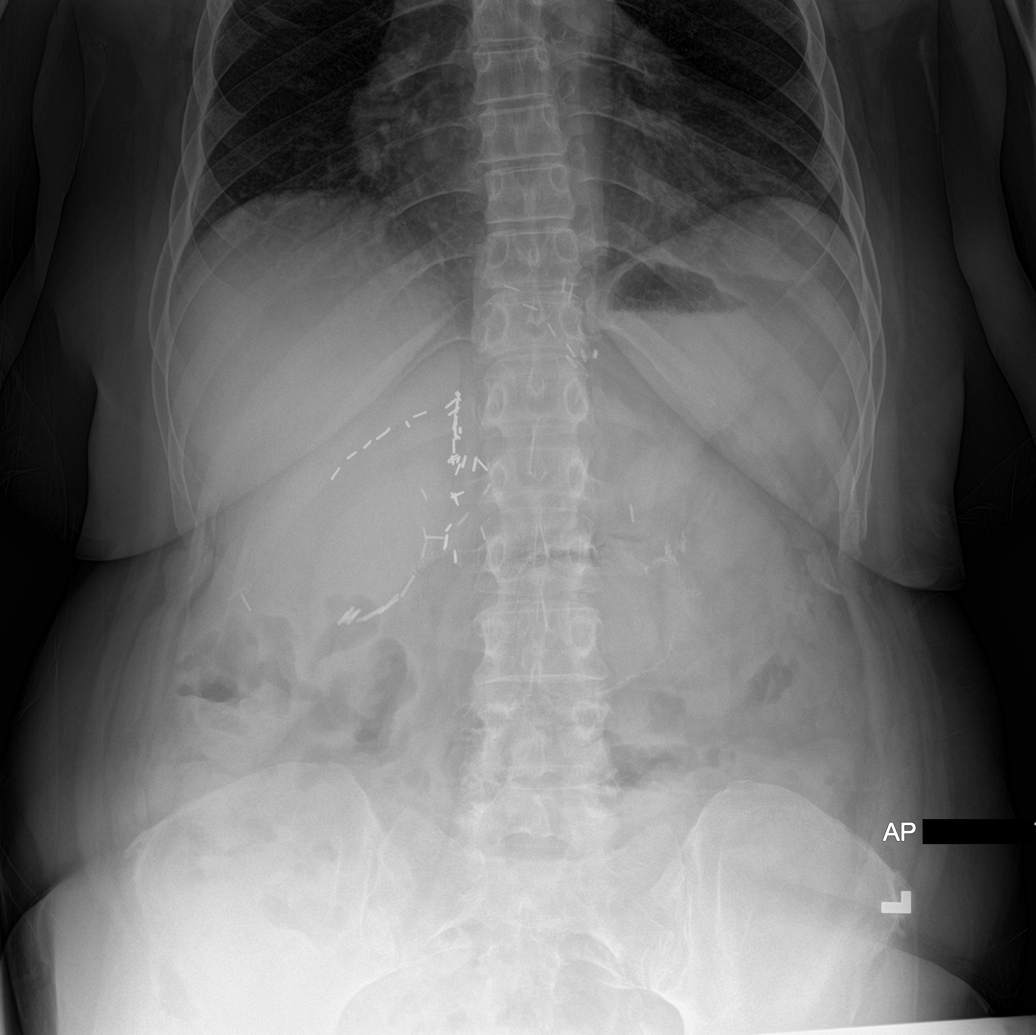

[abdomen supine (1 of 2)]
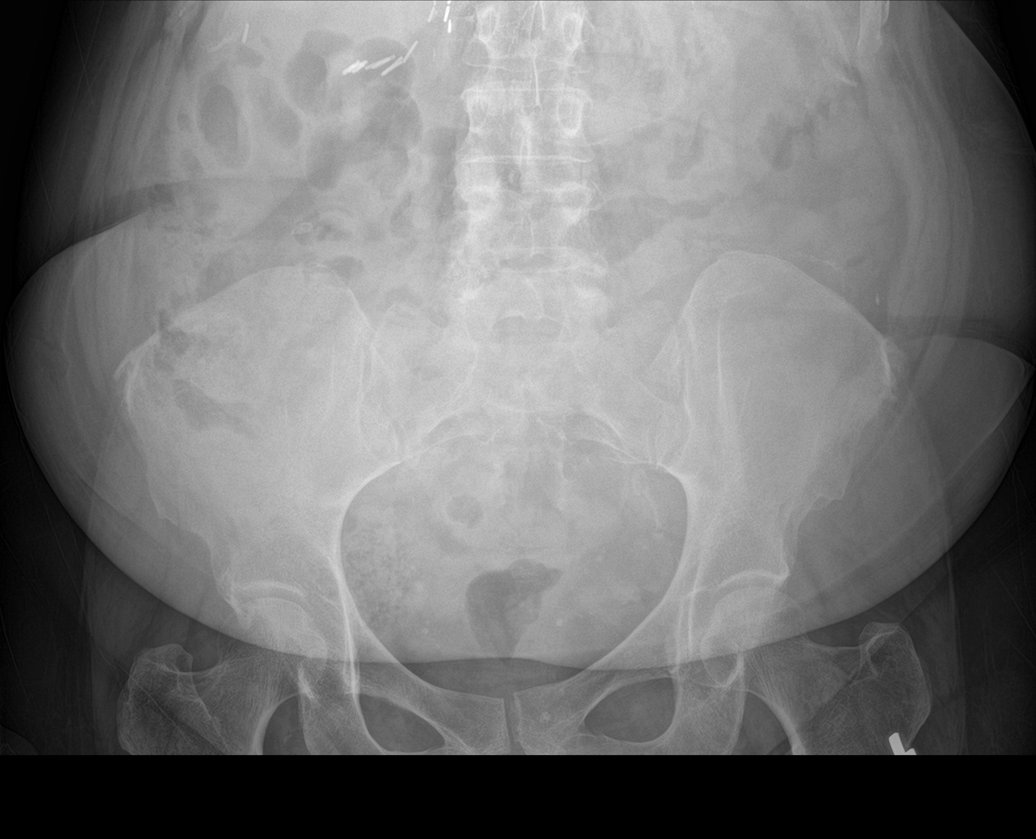

[abdomen supine (2 of 2)]
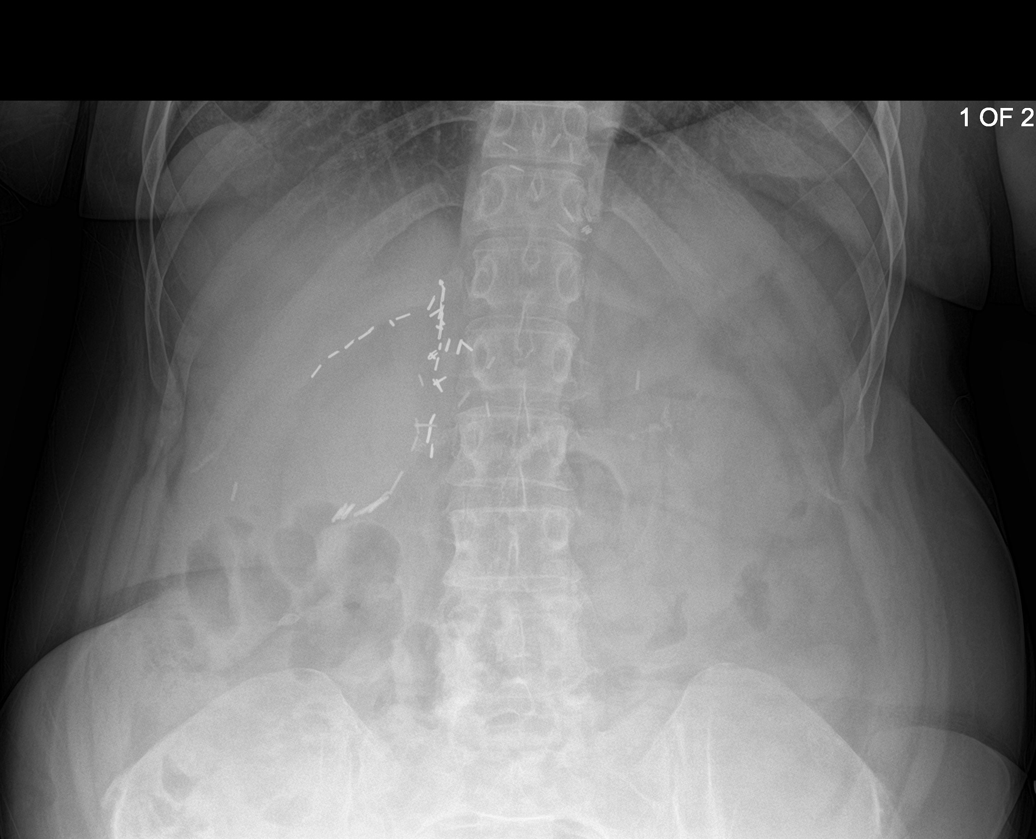

[4 of 4 positions shown; findings below may reference images not displayed]

FINDINGS: There is no evidence of dilated bowel loops or free intraperitoneal
air. No radiopaque calculi or other significant radiographic
abnormality is seen. Heart size and mediastinal contours are within
normal limits. Both lungs are clear. Right upper quadrant surgical
clips noted.
IMPRESSION: Negative abdominal radiographs. No acute cardiopulmonary disease.

## 2022-01-05 IMAGING — MG MM DIGITAL SCREENING BILAT W/ TOMO AND CAD
6 of 10 series · 6 of 30 positions shown · non-contrast
Comparison: Previous exam(s).

CLINICAL DATA: Screening.

EXAM:
DIGITAL SCREENING BILATERAL MAMMOGRAM WITH TOMOSYNTHESIS AND CAD
TECHNIQUE: Bilateral screening digital craniocaudal and mediolateral oblique
mammograms were obtained. Bilateral screening digital breast
tomosynthesis was performed. The images were evaluated with
computer-aided detection.

[R MLO synth-2D]
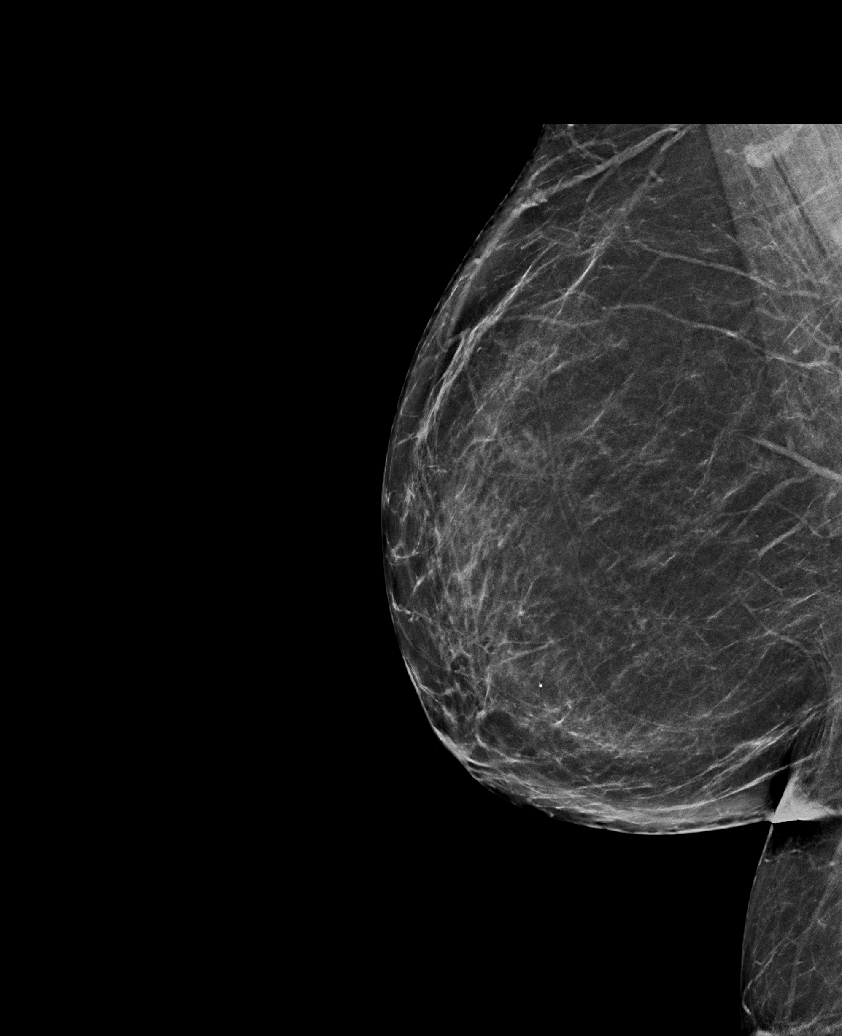

[L MLO synth-2D (1 of 2)]
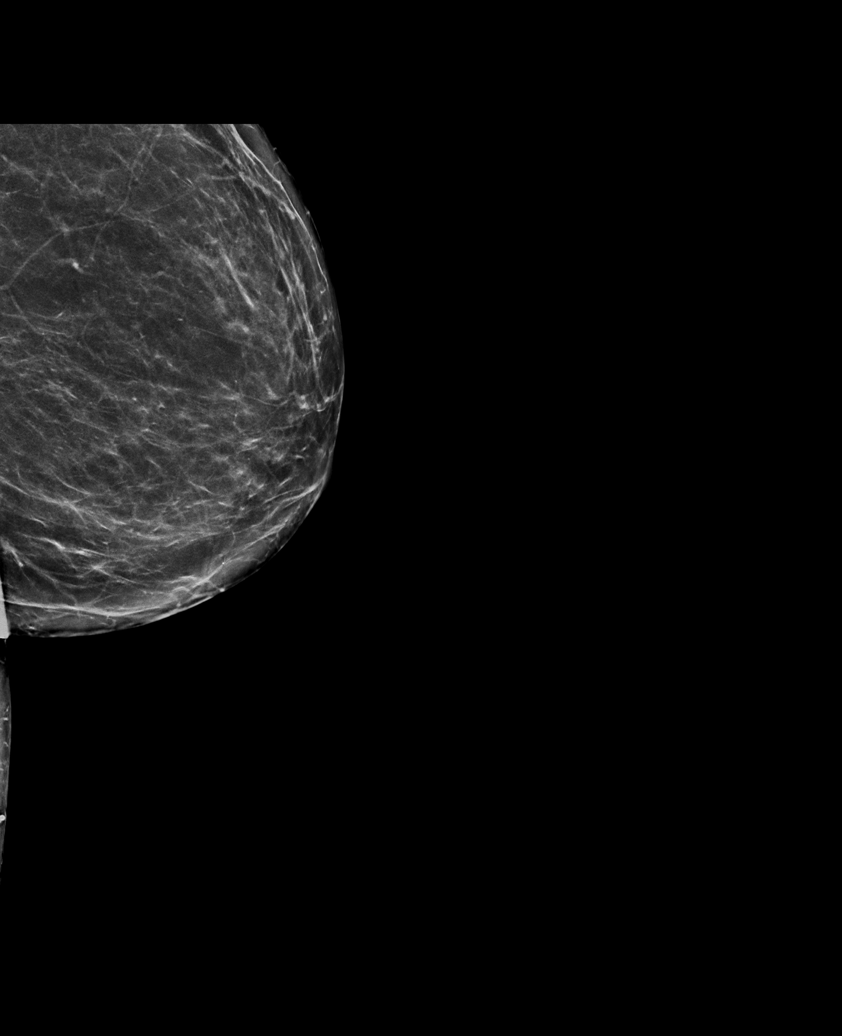

[L CC synth-2D]
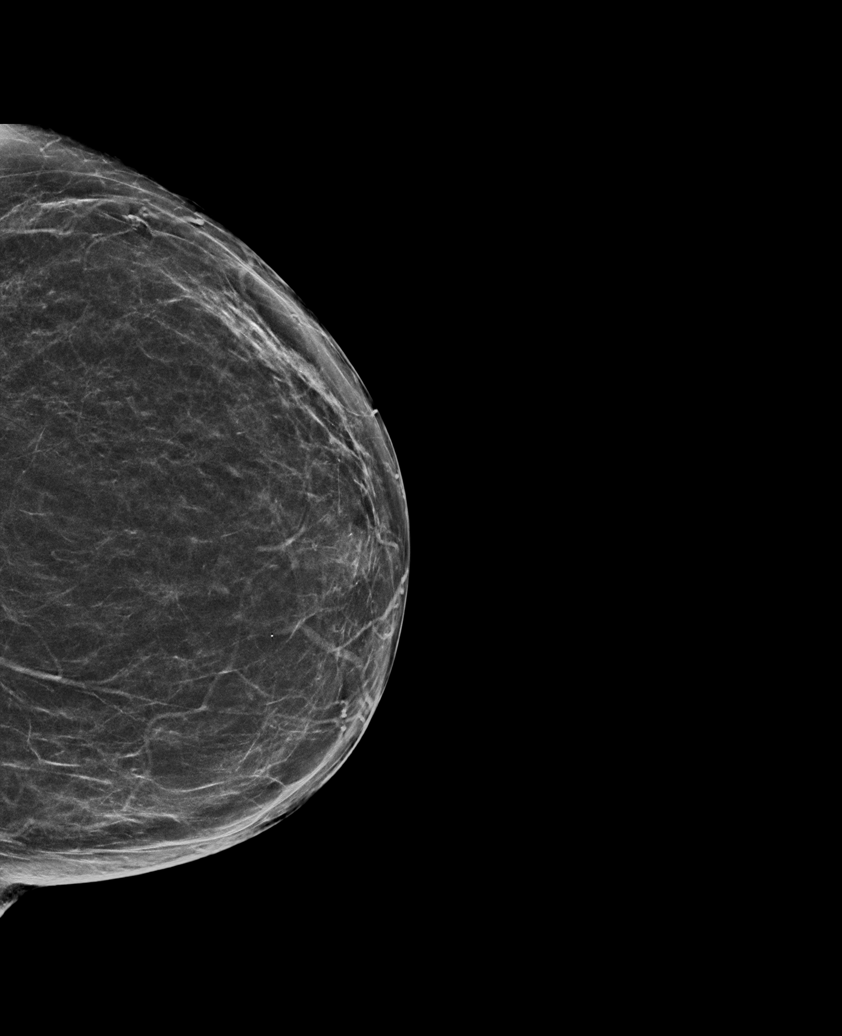

[L MLO synth-2D (2 of 2)]
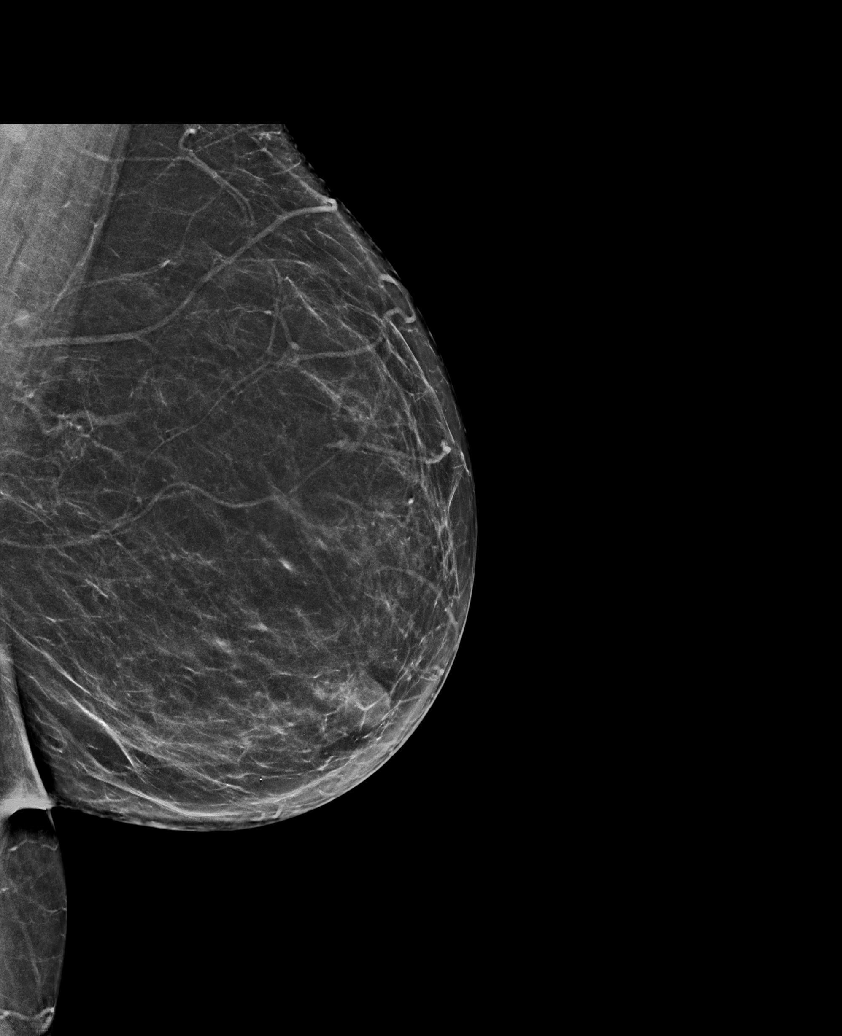

[R CC synth-2D]
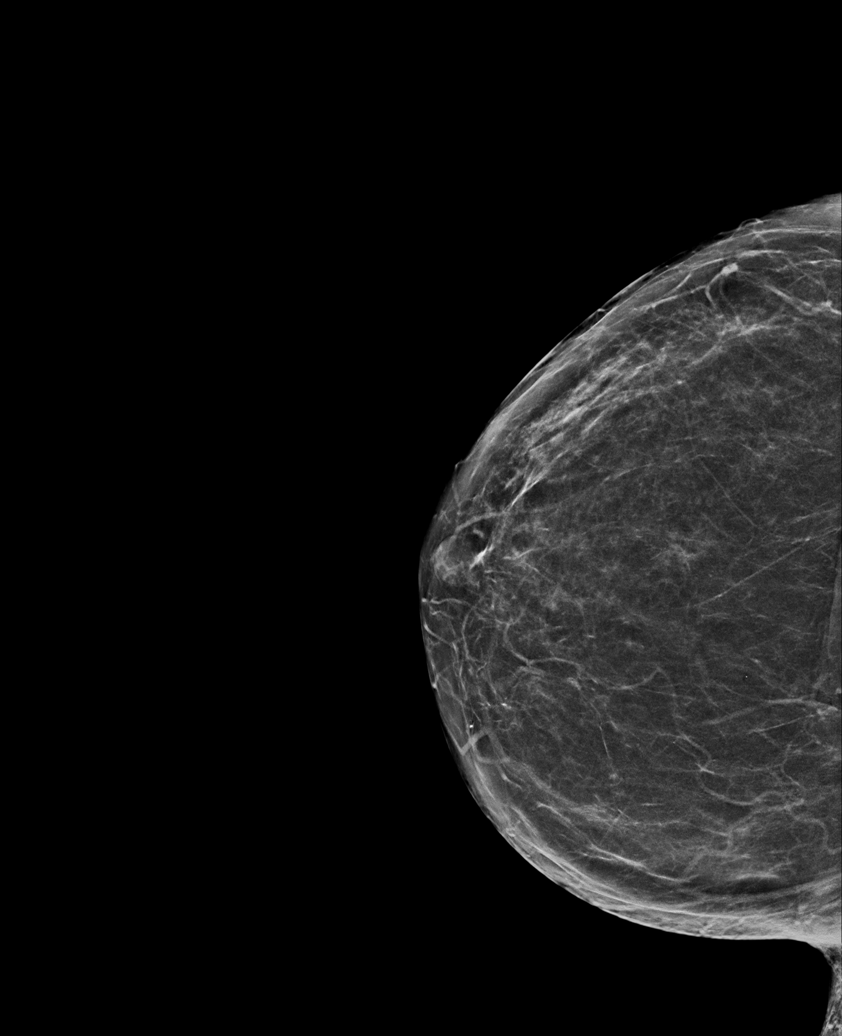

[R CC tomo · tomo slice 33/65.0]
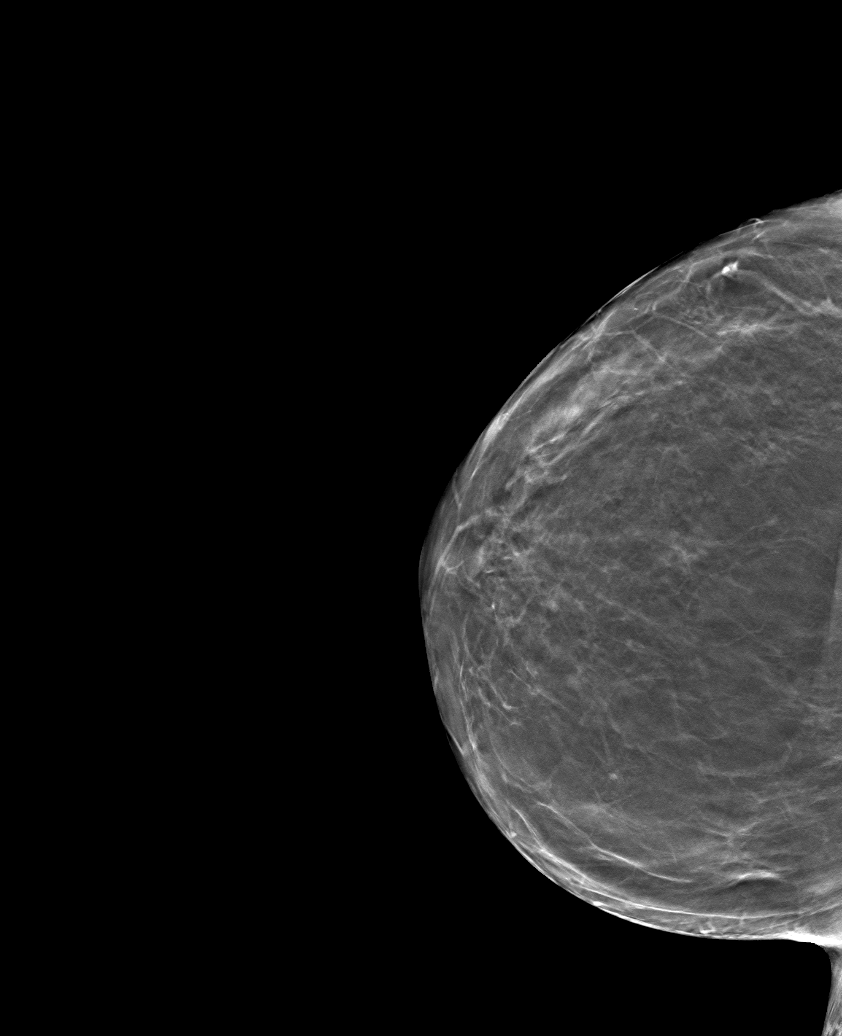

[6 of 30 positions shown; findings below may reference images not displayed]

ACR Breast Density Category b: There are scattered areas of
fibroglandular density.
FINDINGS: There are no findings suspicious for malignancy.
IMPRESSION: No mammographic evidence of malignancy. A result letter of this
screening mammogram will be mailed directly to the patient.

RECOMMENDATION:
Screening mammogram in one year. (Code:51-O-LD2)

BI-RADS CATEGORY  1: Negative.

## 2022-02-05 NOTE — Progress Notes (Deleted)
   CC:  headaches  Follow-up Visit  Last visit: 08/20/21  Brief HPI: 57 year old female with a history of SLE, depression, smoking, CAD  who follows in clinic for migraines. One episode was associated with left-sided weakness and numbness. MRI, MRA, CTV, and LP on 07/13/21 were negative.  At her last visit she was started on Nurtec for migraine rescue  Interval History: Headaches***Nurtec***   Headache days per month: *** Migraine days per month*** Headache free days per month: ***  Current Headache Regimen: Preventative: *** Abortive: ***   Prior Therapies                                  Preventive: Atenolol Cymbalta 30 mg BID  Rescue: Naproxen Baclofen 10 mg PRN Nurtec 75 mg PRN Imitrex 50 mg PRN Triptans contraindicated due to CAD and hemiplegic migraine  Physical Exam:   Vital Signs: LMP  (LMP Unknown)  GENERAL:  well appearing, in no acute distress, alert  SKIN:  Color, texture, turgor normal. No rashes or lesions HEAD:  Normocephalic/atraumatic. RESP: normal respiratory effort MSK:  No gross joint deformities.   NEUROLOGICAL: Mental Status: Alert, oriented to person, place and time, Follows commands, and Speech fluent and appropriate. Cranial Nerves: PERRL, face symmetric, no dysarthria, hearing grossly intact Motor: moves all extremities equally Gait: normal-based.  IMPRESSION: ***  PLAN: ***   Follow-up: ***  I spent a total of *** minutes on the date of the service. Headache education was done. Discussed lifestyle modification including increased oral hydration, decreased caffeine, exercise and stress management. Discussed treatment options including preventive and acute medications, natural supplements, and infusion therapy. Discussed medication overuse headache and to limit use of acute treatments to no more than 2 days/week or 10 days/month. Discussed medication side effects, adverse reactions and drug interactions. Written educational materials  and patient instructions outlining all of the above were given.  Genia Harold, MD

## 2022-02-06 ENCOUNTER — Encounter: Payer: Self-pay | Admitting: Psychiatry

## 2022-02-06 ENCOUNTER — Ambulatory Visit: Payer: Medicare Other | Admitting: Psychiatry

## 2022-02-14 ENCOUNTER — Encounter: Payer: Self-pay | Admitting: Internal Medicine

## 2022-03-05 NOTE — Progress Notes (Unsigned)
Office Visit Note  Patient: Angel Price             Date of Birth: Sep 30, 1965           MRN: [id number]             PCP: Carrolyn Meiers, MD Referring: Carrolyn Meiers* Visit Date: 03/06/2022   Subjective:  Pain of the Right Hip, Pain of the Left Hip, Pain of the Right Knee, Pain of the Left Knee, and Follow-up (Bil leg weakness and pain)   History of Present Illness: Angel Price is a 56 y.o. female here for follow up for seropositive rheumatoid arthritis (RF+, ANA+) on HCQ 300 mg daily. She is experiencing increased pain in bilateral hips and knees with a lot of associated joint popping and feeling of weakness or instability in her legs. This started since about a month ago and she does not recall any events, illness, or medical changes at the time. Not seeing visible swelling in the areas. She has not fallen. She reports normal feeling in her legs.  She is not taking any specific treatment for these symptoms besides her normal arthritis medication and pain medication.    Previous HPI 10/24/21 Angel Price is a 56 y.o. female here for follow up for lupus or UCTD with inflammatory arthritis on hydroxychloroquine 300 mg daily.  Since her last visit she had some major medical events requiring abdominal surgery for incarcerated hernia.  She had drain placed that stayed in until recently removed at the end of July.  However overall her symptoms are doing much better she has had improvement in joint pains and no recurrence of the nasal ulcers.   Previous HPI 03/08/21 Angel Price is a 56 y.o. female here for follow up for lupus or UCTD with inflammatory arthritis after starting hydroxychloroquine 200 mg daily. She feels symptoms are overall improved less difficulty moving around. She still notices some anterior knee pain worse when going up or down stairs. She had one episode of ulcer in her nose on the right side with some drainage currently healed.   Previous  HPI 12/28/20 Angel Price is a 56 y.o. female here for follow up for lupus with several ongoing symptoms but most recently having joint pain with some swelling on left elbow wrist and MCPs were seen on exam. Laboratory work-up was consistent with lupus with positive rheumatoid factor slightly low serum complement.  No changes or events since her last visit.    Previous HPI 12/08/20 Angel Price is a 56 y.o. female here for lupus. 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 Previous serology showed positive ANA and RF. Past imaging showing degenerative changes in lumbar spine.  She has not followed up with any rheumatologist for many years. She recalls use of NSAIDs and steroids and thinks plaquenil sounds familiar but not sure of an other long term lupus treatment. 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  She is currently following with cardiology for angina loss of consciousness no obstructive CAD on extensive work-up including coronary angiography attributed to suspected microvascular dysfunction. She is required abdominal surgeries for gastrointestinal bleeding with dieulafoy lesion of duodenum.  Abdominal mesh surgery for repair of incisional hernia was complicated with infection attributed in part to continue nicotine abuse.   Review  of Systems  Constitutional:  Positive for fatigue.  HENT:  Positive for mouth dryness. Negative for mouth sores.   Eyes:  Positive for dryness.  Respiratory:  Negative for shortness of breath.   Cardiovascular:  Negative for chest pain and palpitations.  Gastrointestinal:  Negative for blood in stool, constipation and diarrhea.  Endocrine: Positive for increased urination.  Genitourinary:  Positive for involuntary urination.  Musculoskeletal:   Positive for joint pain, gait problem, joint pain, myalgias, muscle weakness, morning stiffness, muscle tenderness and myalgias. Negative for joint swelling.  Skin:  Positive for hair loss and sensitivity to sunlight. Negative for color change and rash.  Allergic/Immunologic: Negative for susceptible to infections.  Neurological:  Negative for dizziness and headaches.  Hematological:  Negative for swollen glands.  Psychiatric/Behavioral:  Positive for depressed mood and sleep disturbance. The patient is nervous/anxious.     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   Cellulitis 10/06/2021   Paroxysmal atrial fibrillation ([place]) 10/06/2021   Abdominal fluid collection 10/06/2021   Constipation 09/14/2021   Ventral hernia 05/15/2021   Dysphagia 05/15/2021   Tobacco dependence 09/12/2020   Continuous dependence on cigarette smoking 08/22/2020   Mixed hyperlipidemia 04/27/2020   Recurrent incisional hernia 02/03/2020   Bilateral knee pain 12/29/2019   Coronary artery disease of native artery of native heart with stable angina pectoris ([place]) 06/25/2019   Acute respiratory failure with hypoxia ([place]) 05/20/2019   Essential hypertension 05/20/2019   COVID-19 05/20/2019   Syncope and collapse 40/98/1191   Metabolic acidosis 47/82/9562   Intractable vomiting with nausea 02/23/2019   Cocaine abuse ([place])    Colitis 11/11/2018   Hemorrhoidal skin tag    Folate deficiency 05/11/2017   Abdominal wall fistula    Cellulitis of abdominal wall 01/02/2017   Acute left hemiparesis ([place]) 12/05/2016   Malingering 12/05/2016   Weakness of left lower extremity 12/02/2016   CVA (cerebral vascular accident) ([place]) 11/23/2016   Bright red rectal bleeding 08/22/2016   Hypotension due to blood loss    Acute GI bleeding 05/24/2016   Dieulafoy lesion of duodenum    History of Billroth II operation    Gastrointestinal hemorrhage  05/22/2016   Absolute anemia    Acute pancreatitis 10/01/2015   Alcohol abuse with intoxication ([place])    Left-sided weakness    Psychosomatic factor in physical condition    Upper GI bleed    Diverticulosis of colon with hemorrhage    Chronic wound infection of abdomen 12/22/2014   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   HTN (hypertension) 12/22/2014   Ataxia 11/01/2014   Hemorrhoids 04/26/2014   Lung nodule 04/26/2014   Diverticulosis    Gastritis    Hiatal hernia    Schatzki's ring    Acute blood loss anemia 04/25/2014   Hypokalemia 04/25/2014   Hematemesis with nausea 04/25/2014   Lung nodule < 6cm on CT 04/25/2014   Intractable nausea and vomiting 04/24/2014   Gastroparesis    Abdominal pain 04/01/2014   Gastroenteritis 12/10/2013   Chronic abdominal wound infection 08/16/2013   MDD (major depressive disorder), recurrent episode, severe ([place]) 06/27/2013   Wrist laceration 06/24/2013   Diarrhea 05/07/2013   Rectal bleeding 05/07/2013   Adrenal mass, left ([place]) 05/07/2013   Post-operative wound abscess 04/19/2013   Abdominal wall abscess 04/18/2013   Essential hypertension, benign 03/30/2013   History of cocaine abuse ([place]) 03/16/2013   History of schizoaffective disorder 03/16/2013   Bipolar disorder (  [place]) 03/16/2013   Personality disorder ([place]) 03/16/2013   Tobacco use disorder 03/16/2013   Alcohol abuse 03/16/2013   Palpitations 03/16/2013   Poor vision 03/16/2013   History of gastric bypass 03/16/2013   Status post hysterectomy with oophorectomy 03/16/2013   Hypothyroid 03/16/2013   Lupus ([place]) 03/16/2013    Past Medical History:  Diagnosis Date   Abscess    soft tissue   Adrenal mass ([facility])    Alcohol abuse    Anemia    Anxiety    Blood transfusion without reported diagnosis    Chronic abdominal pain    Chronic wound infection of abdomen    Colon polyp    colonoscopy 04/2014   Depression    Discoid lupus erythematosus    Diverticulosis    colonoscopy 04/2014  moderat pan colonic   Drug-seeking behavior    Gastritis    EGD 05/2014   Gastroparesis 01/2014   GERD (gastroesophageal reflux disease)    Hemorrhoid    internal large   Hiatal hernia    History of Billroth II operation    Hypertension    Lung nodule    CT 02/2014 needs repeat 1 month   Lung nodule < 6cm on CT 04/25/2014   Lupus (H[facility])    Malingering    Migraine    Nausea and vomiting    chronic, recurrent   Pancreatitis    Schatzki's ring    patent per EGD 04/2014   Sickle cell trait ([place])    Stroke ([place])    Suicide attempt ([place])    Thyroid disease 2000   overactive, radiation    Family History  Problem Relation Age of Onset   Drug abuse Mother    Lung cancer Father    Cancer Father        mets   Drug abuse Sister    Drug abuse Brother    Breast cancer Maternal Aunt    Bipolar disorder Maternal Aunt    Drug abuse Maternal Aunt    Colon cancer Maternal Grandmother        late 75s, early 60s   Bipolar disorder Paternal Grandfather    Brain cancer Son    Schizophrenia Son    Cancer Son        brain   Bipolar disorder Cousin    Liver disease Neg Hx    Past Surgical History:  Procedure Laterality Date   ABDOMINAL HYSTERECTOMY  2013   [place]   ABDOMINAL HYSTERECTOMY     ABDOMINAL SURGERY     ADRENALECTOMY Right    AGILE CAPSULE N/A 01/05/2015   Procedure: AGILE CAPSULE;  Surgeon: Daneil Dolin, MD;  [facility];  Service: Endoscopy;  Laterality: N/A;  0700   BALLOON DILATION N/A 10/01/2021   Procedure: BALLOON DILATION;  Surgeon: Eloise Harman, DO;  [facility];  Service: Endoscopy;  Laterality: N/A;   Billroth II procedure      [place], first 2000, 2005/2006.   bilroth 2     BIOPSY  05/20/2013   Procedure: BIOPSIES OF ASCENDING AND SIGMOID COLON;  Surgeon: Daneil Dolin, MD;  Location: [facility];  Service: Endoscopy;;   BIOPSY  04/26/2014   Procedure: BIOPSIES;  Surgeon: Danie Binder, MD;  Location: [facility];  Service:  Endoscopy;;   BIOPSY  12/24/2018   Procedure: BIOPSY;  Surgeon: Daneil Dolin, MD;  [facility];  Service: Endoscopy;;  colon   CHOLECYSTECTOMY  COLONOSCOPY     in [place]   COLONOSCOPY WITH PROPOFOL N/A 05/20/2013   Dr.Rourk- inadequate prep, normal appearing rectum, grossly normal colon aside from pancolonic diverticula, normal terminal ileum bx= unremarkable colonic mucosa. Due for early interval 2016.    COLONOSCOPY WITH PROPOFOL N/A 04/26/2014   IPJ:ASNKNL ileum/one colon polyp removed/moderate pan-colonic diverticulosis/large internal hemorrhoids   COLONOSCOPY WITH PROPOFOL N/A 12/23/2014   Dr.Rourk- minimal internal hemorrhoids, pancolonic diverticulosis   COLONOSCOPY WITH PROPOFOL N/A 08/23/2016   Procedure: COLONOSCOPY WITH PROPOFOL;  Surgeon: Danie Binder, MD;  [facility];  Service: Endoscopy;  Laterality: N/A;   COLONOSCOPY WITH PROPOFOL N/A 12/24/2018   Pancolonic diverticulosis, normal TI, one 5 mm polyp in rectum (tubular adenoma). Somewhat friable hemorrhagic mucosa in area of IC valve/cecum, possibly related to scope trauma s/p biopsy. Repeat in 7 years.   DEBRIDEMENT OF ABDOMINAL WALL ABSCESS N/A 02/08/2013   Procedure: DEBRIDEMENT OF ABDOMINAL WALL ABSCESS;  Surgeon: Jamesetta So, MD;  Location: [facility];  Service: General;  Laterality: N/A;   ESOPHAGOGASTRODUODENOSCOPY (EGD) WITH PROPOFOL N/A 05/20/2013   Dr.Rourk- s/p prior gastric surgery with normal esophagus, residual gastric mucosa and patent efferent limb   ESOPHAGOGASTRODUODENOSCOPY (EGD) WITH PROPOFOL N/A 02/03/2014   Dr. Gala Romney:  s/p hemigastrectomy with retained gastric contents. Residual gastric mucosa and efferent limb appeared normal otherwise. Query gastroparesis.    ESOPHAGOGASTRODUODENOSCOPY (EGD) WITH PROPOFOL N/A 04/26/2014   ZJQ:BHALPFXT'[id number] ring/small HH/mild non-erosive gasrtitis/normal anastomosis   ESOPHAGOGASTRODUODENOSCOPY (EGD) WITH PROPOFOL N/A 12/23/2014    Dr.Rourk- s/p prior hemigastrctomy, active oozing from anastomotic suture site, hemostasis achieved   ESOPHAGOGASTRODUODENOSCOPY (EGD) WITH PROPOFOL N/A 05/23/2016   Dr. 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    ESOPHAGOGASTRODUODENOSCOPY (EGD) WITH PROPOFOL N/A 05/11/2017   Procedure: ESOPHAGOGASTRODUODENOSCOPY (EGD) WITH PROPOFOL;  Surgeon: Daneil Dolin, MD;  [facility];  Service: Endoscopy;  Laterality: N/A;   ESOPHAGOGASTRODUODENOSCOPY (EGD) WITH PROPOFOL N/A 06/07/2021   Procedure: ESOPHAGOGASTRODUODENOSCOPY (EGD) WITH PROPOFOL;  Surgeon: Daneil Dolin, MD;  [facility];  Service: Endoscopy;  Laterality: N/A;  11:45am   ESOPHAGOGASTRODUODENOSCOPY (EGD) WITH PROPOFOL N/A 10/01/2021   Procedure: ESOPHAGOGASTRODUODENOSCOPY (EGD) WITH PROPOFOL;  Surgeon: Eloise Harman, DO;  [facility];  Service: Endoscopy;  Laterality: N/A;  3:00pm   EXCISION OF MESH  08/2019   HEMORRHOID SURGERY N/A 06/18/2017   Procedure: THREE COLUMN EXTENSIVE HEMORRHOIDECTOMY;  Surgeon: Virl Cagey, MD;  Location: [facility];  Service: General;  Laterality: N/A;   HERNIA REPAIR     INCISION AND DRAINAGE ABSCESS N/A 01/06/2017   Procedure: INCISION AND DRAINAGE ABDOMINAL WALL ABSCESS;  Surgeon: Aviva Signs, MD;  Location: [facility];  Service: General;  Laterality: N/A;   incisional hernial repair  09/05/2021   [place]   LEFT HEART CATH AND CORONARY ANGIOGRAPHY N/A 05/09/2020   Procedure: LEFT HEART CATH AND CORONARY ANGIOGRAPHY;  Surgeon: Nigel Mormon, MD;  Location: [facility];  Service: Cardiovascular;  Laterality: N/A;   POLYPECTOMY  12/24/2018   Procedure: POLYPECTOMY;  Surgeon: Daneil Dolin, MD;  [facility];  Service: Endoscopy;;  colon   tendon repar Right    wrist   WOUND EXPLORATION Right 06/24/2013   Procedure: exploration of traumatic  wound right wrist;  Surgeon: Tennis Must, MD;  Location: [facility];  Service: Orthopedics;  Laterality: Right;   Social History   Social History  Narrative   Caffeine- tea occass   Immunization History  Administered Date(s) Administered   Influenza,inj,Quad PF,6+ Mos 02/08/2013, 04/02/2014, 12/23/2014, 12/03/2016   Influenza-Unspecified 02/08/2013, 04/02/2014, 12/23/2014, 12/03/2016   Pneumococcal Polysaccharide-23 06/25/2013, 12/03/2016, 11/12/2018   Tdap 03/16/2013, 06/24/2013     Objective: Vital Signs: BP (!) 150/104 (BP Location: Left Arm, Patient Position: Sitting, Cuff Size: Normal)   Pulse 80   Resp 16   Ht '5\' 3"'$  (1.6 m)   Wt 147 lb (66.7 kg)   LMP  (LMP Unknown)   BMI 26.04 kg/m    Physical Exam Cardiovascular:     Rate and Rhythm: Normal rate and regular rhythm.  Pulmonary:     Effort: Pulmonary effort is normal.     Breath sounds: Normal breath sounds.  Musculoskeletal:     Right lower leg: No edema.     Left lower leg: No edema.  Skin:    General: Skin is warm and dry.  Neurological:     Mental Status: She is alert.  Psychiatric:        Mood and Affect: Mood normal.      Musculoskeletal Exam:  Shoulders full ROM no tenderness or swelling Elbows full ROM no tenderness or swelling Wrists full ROM no tenderness or swelling Fingers full ROM no tenderness or swelling Knees full ROM tenderness to pressure worst along joint line both sides, some posterior and extending into calf, no palpable swelling Ankles full ROM some posterior tenderness at achilles and insertion   CDAI Exam: CDAI Score: 7  Patient Global: 40 mm; Provider Global: 10 mm Swollen: 0 ; Tender: 4  Joint Exam 03/06/2022      Right  Left  Hip   Tender   Tender  Knee   Tender   Tender     Investigation: No additional findings.  Imaging: XR KNEE 3 VIEW LEFT  Result Date: 03/06/2022 X-ray left knee 3 views Medial and lateral compartment joint spaces appear normal.  Patellofemoral  joint space appears normal.  No osteophytes.  No erosions or abnormal calcifications seen. Impression No acute injury or abnormality no significant appearing osteoarthritis  XR KNEE 3 VIEW RIGHT  Result Date: 03/06/2022 X-ray right knee 3 views Medial and lateral compartment joint spaces appear normal.  Patellofemoral joint space appears normal.  No osteophytes.  Probable small joint effusion. no erosions or abnormal calcifications seen. Impression No acute injury or abnormality no significant appearing osteoarthritis joint effusion present  XR HIPS BILAT W OR W/O PELVIS 2V  Result Date: 03/06/2022 X-ray hips and pelvis 3 views Visualized portion of lumbar spine demonstrating some degenerative changes with no acute appearing abnormality.  Degenerative appearing SI joint changes with some asymmetric joint space but no significant sclerosis no erosions or joint effusion.  Hip joint spaces appear normal bilaterally.  No erosions or abnormal calcifications seen. Impression Probable degenerative changes in visualized portion of lumbar spine and SI joints no acute appearing abnormality and no significant appearing hip arthritis   Recent Labs: Lab Results  Component Value Date   WBC 7.5 10/06/2021   HGB 11.3 (L) 10/06/2021   PLT 348 10/06/2021   NA 140 10/06/2021   K 3.4 (L) 10/06/2021   CL 111 10/06/2021   CO2 20 (L) 10/06/2021   GLUCOSE 97 10/06/2021   BUN 7 10/06/2021   CREATININE 0.65 10/06/2021   BILITOT 0.8 10/06/2021   ALKPHOS 69 10/06/2021   AST 95 (H) 10/06/2021   ALT 23 10/06/2021   PROT 7.0 10/06/2021  ALBUMIN 3.7 10/06/2021   CALCIUM 8.8 (L) 10/06/2021   GFRAA 119 05/05/2020    Speciality Comments: PLQ Eye Exam 08/10/2021 [place] f/u 1 month  Procedures:  No procedures performed Allergies: Metoclopramide, Codeine, and Reglan [metoclopramide]   Assessment / Plan:     Visit Diagnoses: Acute pain of both knees - Plan: XR KNEE 3 VIEW RIGHT, XR KNEE 3  VIEW LEFT, XR HIPS BILAT W OR W/O PELVIS 2V  Increased knee pain with some intermittent weakness and instability symptoms as well.  X-ray of bilateral knees shows some probable joint effusion but otherwise there is minimal osteoarthritis.  She is not interested in trial of intra-articular steroid injection so treating with oral steroid course today for possible RA exacerbation.  Seronegative inflammatory arthritis - Plan: Sedimentation rate, C-reactive protein, CK, predniSONE (DELTASONE) 5 MG tablet  Besides her legs upper extremity symptoms not in particular exacerbation.  This may represent flare of her rheumatoid arthritis.  Checking sedimentation rate and CRP if elevated would suggest this is the case and might need to consider additional long-term maintenance treatment.  Otherwise continue hydroxychloroquine 300 mg daily.  Prescription for prednisone taper down from starting 20 mg for next 12 days to pharmacy.  Pain in both lower extremities - Plan: XR HIPS BILAT W OR W/O PELVIS 2V, Sedimentation rate, C-reactive protein, CK  The hip and upper leg pain is more in lateral distribution and without significant range of motion defect to suggest intra-articular pathology.  Check x-ray of bilateral hips and pelvis due to increased hip pain and issues in the upper leg.  I do not see any significant extent of osteoarthritis to explain her reported symptoms.  Also checking CK level given the reported weakness and increased muscle soreness.  Orders: Orders Placed This Encounter  Procedures   XR KNEE 3 VIEW RIGHT   XR KNEE 3 VIEW LEFT   XR HIPS BILAT W OR W/O PELVIS 2V   Sedimentation rate   C-reactive protein   CK   Meds ordered this encounter  Medications   predniSONE (DELTASONE) 5 MG tablet    Sig: Take 4 tablets (20 mg total) by mouth daily with breakfast for 3 days, THEN 3 tablets (15 mg total) daily with breakfast for 3 days, THEN 2 tablets (10 mg total) daily with breakfast for 3 days, THEN  1 tablet (5 mg total) daily with breakfast for 3 days.    Dispense:  30 tablet    Refill:  0     Follow-Up Instructions: No follow-ups on file.   Collier Salina, MD  Note - This record has been created using [employer].  Chart creation errors have been sought, but may not always  have been located. Such creation errors do not reflect on  the standard of medical care.

## 2022-03-06 ENCOUNTER — Encounter: Payer: Self-pay | Admitting: Internal Medicine

## 2022-03-06 ENCOUNTER — Ambulatory Visit (INDEPENDENT_AMBULATORY_CARE_PROVIDER_SITE_OTHER): Payer: Medicare Other

## 2022-03-06 ENCOUNTER — Ambulatory Visit: Payer: Medicare Other

## 2022-03-06 ENCOUNTER — Ambulatory Visit: Payer: Medicare Other | Attending: Internal Medicine | Admitting: Internal Medicine

## 2022-03-06 VITALS — BP 150/104 | HR 80 | Resp 16 | Ht 63.0 in | Wt 147.0 lb

## 2022-03-06 DIAGNOSIS — M25561 Pain in right knee: Secondary | ICD-10-CM

## 2022-03-06 DIAGNOSIS — M79604 Pain in right leg: Secondary | ICD-10-CM | POA: Diagnosis not present

## 2022-03-06 DIAGNOSIS — M79605 Pain in left leg: Secondary | ICD-10-CM | POA: Diagnosis not present

## 2022-03-06 DIAGNOSIS — M1380 Other specified arthritis, unspecified site: Secondary | ICD-10-CM | POA: Diagnosis not present

## 2022-03-06 DIAGNOSIS — M25562 Pain in left knee: Secondary | ICD-10-CM | POA: Diagnosis not present

## 2022-03-06 MED ORDER — PREDNISONE 5 MG PO TABS: ORAL_TABLET | ORAL | 0 refills | Status: AC

## 2022-03-07 LAB — C-REACTIVE PROTEIN: CRP: 0.8 mg/L (ref <8.0)

## 2022-03-07 LAB — SEDIMENTATION RATE: Sed Rate: 6 mm/h (ref 0–30)

## 2022-03-07 LAB — CK: Total CK: 38 U/L (ref 29–143)

## 2022-04-26 ENCOUNTER — Ambulatory Visit: Payer: Medicare Other | Admitting: Internal Medicine

## 2022-05-01 NOTE — Progress Notes (Unsigned)
Office Visit Note  Patient: Angel Price             Date of Birth: Dec 21, 1965           MRN: [id number]             PCP: Carrolyn Meiers, MD Referring: Carrolyn Meiers* Visit Date: 05/02/2022   Subjective:  No chief complaint on file.   History of Present Illness: Angel Price is a 57 y.o. female here for follow up ***   Previous HPI 03/06/22 Angel Price is a 57 y.o. female here for follow up for seropositive rheumatoid arthritis (RF+, ANA+) on HCQ 300 mg daily. She is experiencing increased pain in bilateral hips and knees with a lot of associated joint popping and feeling of weakness or instability in her legs. This started since about a month ago and she does not recall any events, illness, or medical changes at the time. Not seeing visible swelling in the areas. She has not fallen. She reports normal feeling in her legs.  She is not taking any specific treatment for these symptoms besides her normal arthritis medication and pain medication.       Previous HPI 10/24/21 Angel Price is a 57 y.o. female here for follow up for lupus or UCTD with inflammatory arthritis on hydroxychloroquine 300 mg daily.  Since her last visit she had some major medical events requiring abdominal surgery for incarcerated hernia.  She had drain placed that stayed in until recently removed at the end of July.  However overall her symptoms are doing much better she has had improvement in joint pains and no recurrence of the nasal ulcers.   Previous HPI 03/08/21 Angel Price is a 57 y.o. female here for follow up for lupus or UCTD with inflammatory arthritis after starting hydroxychloroquine 200 mg daily. She feels symptoms are overall improved less difficulty moving around. She still notices some anterior knee pain worse when going up or down stairs. She had one episode of ulcer in her nose on the right side with some drainage currently healed.   Previous  HPI 12/28/20 Angel Price is a 57 y.o. female here for follow up for lupus with several ongoing symptoms but most recently having joint pain with some swelling on left elbow wrist and MCPs were seen on exam. Laboratory work-up was consistent with lupus with positive rheumatoid factor slightly low serum complement.  No changes or events since her last visit.    Previous HPI 12/08/20 Angel Price is a 57 y.o. female here for lupus. 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 Previous serology showed positive ANA and RF. Past imaging showing degenerative changes in lumbar spine.  She has not followed up with any rheumatologist for many years. She recalls use of NSAIDs and steroids and thinks plaquenil sounds familiar but not sure of an other long term lupus treatment. 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  She is currently following with cardiology for angina loss of consciousness no obstructive CAD on extensive work-up including coronary angiography attributed to suspected microvascular dysfunction. She is required abdominal surgeries for gastrointestinal bleeding with dieulafoy lesion of duodenum.  Abdominal mesh surgery for repair of incisional hernia was complicated with infection attributed in part to continue nicotine abuse.  No Rheumatology ROS completed.   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   Cellulitis 10/06/2021   Paroxysmal atrial fibrillation ([place]) 10/06/2021   Abdominal fluid collection 10/06/2021   Constipation 09/14/2021   Ventral hernia 05/15/2021   Dysphagia 05/15/2021   Tobacco dependence 09/12/2020   Continuous dependence on cigarette smoking 08/22/2020   Mixed hyperlipidemia  04/27/2020   Recurrent incisional hernia 02/03/2020   Bilateral knee pain 12/29/2019   Coronary artery disease of native artery of native heart with stable angina pectoris ([place]) 06/25/2019   Acute respiratory failure with hypoxia ([place]) 05/20/2019   Essential hypertension 05/20/2019   COVID-19 05/20/2019   Syncope and collapse 0000000   Metabolic acidosis 0000000   Intractable vomiting with nausea 02/23/2019   Cocaine abuse ([place])    Colitis 11/11/2018   Hemorrhoidal skin tag    Folate deficiency 05/11/2017   Abdominal wall fistula    Cellulitis of abdominal wall 01/02/2017   Acute left hemiparesis ([place]) 12/05/2016   Malingering 12/05/2016   Weakness of left lower extremity 12/02/2016   CVA (cerebral vascular accident) ([place]) 11/23/2016   Bright red rectal bleeding 08/22/2016   Hypotension due to blood loss    Acute GI bleeding 05/24/2016   Dieulafoy lesion of duodenum    History of Billroth II operation    Gastrointestinal hemorrhage 05/22/2016   Absolute anemia    Acute pancreatitis 10/01/2015   Alcohol abuse with intoxication ([place])    Left-sided weakness    Psychosomatic factor in physical condition    Upper GI bleed    Diverticulosis of colon with hemorrhage    Chronic wound infection of abdomen 12/22/2014   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   HTN (hypertension) 12/22/2014   Ataxia 11/01/2014   Hemorrhoids 04/26/2014   Lung nodule 04/26/2014   Diverticulosis    Gastritis    Hiatal hernia    Schatzki's ring    Acute blood loss anemia 04/25/2014   Hypokalemia 04/25/2014   Hematemesis with nausea 04/25/2014   Lung nodule < 6cm on CT 04/25/2014   Intractable nausea and vomiting 04/24/2014   Gastroparesis    Abdominal pain 04/01/2014   Gastroenteritis 12/10/2013   Chronic abdominal wound infection 08/16/2013   MDD (major depressive disorder), recurrent episode, severe ([place]) 06/27/2013   Wrist laceration 06/24/2013   Diarrhea 05/07/2013   Rectal bleeding 05/07/2013    Adrenal mass, left ([place]) 05/07/2013   Post-operative wound abscess 04/19/2013   Abdominal wall abscess 04/18/2013   Essential hypertension, benign 03/30/2013   History of cocaine abuse ([place]) 03/16/2013   History of schizoaffective disorder 03/16/2013   Bipolar disorder ([place]) 03/16/2013   Personality disorder ([place]) 03/16/2013   Tobacco use disorder 03/16/2013   Alcohol abuse 03/16/2013   Palpitations 03/16/2013   Poor vision 03/16/2013   History of gastric bypass 03/16/2013   Status post hysterectomy with oophorectomy 03/16/2013   Hypothyroid 03/16/2013   Lupus ([place]) 03/16/2013    Past Medical History:  Diagnosis Date   Abscess    soft tissue   Adrenal mass (H[facility])    Alcohol abuse    Anemia    Anxiety    Blood transfusion without reported diagnosis    Chronic abdominal pain    Chronic wound infection of abdomen    Colon polyp    colonoscopy 04/2014   Depression    Discoid lupus erythematosus    Diverticulosis    colonoscopy 04/2014 moderat pan colonic   Drug-seeking  behavior    Gastritis    EGD 05/2014   Gastroparesis 01/2014   GERD (gastroesophageal reflux disease)    Hemorrhoid    internal large   Hiatal hernia    History of Billroth II operation    Hypertension    Lung nodule    CT 02/2014 needs repeat 1 month   Lung nodule < 6cm on CT 04/25/2014   Lupus (HCC)    Malingering    Migraine    Nausea and vomiting    chronic, recurrent   Pancreatitis    Schatzki's ring    patent per EGD 04/2014   Sickle cell trait ([place])    Stroke ([place])    Suicide attempt ([place])    Thyroid disease 2000   overactive, radiation    Family History  Problem Relation Age of Onset   Drug abuse Mother    Lung cancer Father    Cancer Father        mets   Drug abuse Sister    Drug abuse Brother    Breast cancer Maternal Aunt    Bipolar disorder Maternal Aunt    Drug abuse Maternal Aunt    Colon cancer Maternal Grandmother        late 62s, early 78s   Bipolar disorder Paternal  Grandfather    Brain cancer Son    Schizophrenia Son    Cancer Son        brain   Bipolar disorder Cousin    Liver disease Neg Hx    Past Surgical History:  Procedure Laterality Date   ABDOMINAL HYSTERECTOMY  2013   [place]   ABDOMINAL HYSTERECTOMY     ABDOMINAL SURGERY     ADRENALECTOMY Right    AGILE CAPSULE N/A 01/05/2015   Procedure: AGILE CAPSULE;  Surgeon: Daneil Dolin, MD;  [facility];  Service: Endoscopy;  Laterality: N/A;  0700   BALLOON DILATION N/A 10/01/2021   Procedure: BALLOON DILATION;  Surgeon: Eloise Harman, DO;  [facility];  Service: Endoscopy;  Laterality: N/A;   Billroth II procedure      [place], first 2000, 2005/2006.   bilroth 2     BIOPSY  05/20/2013   Procedure: BIOPSIES OF ASCENDING AND SIGMOID COLON;  Surgeon: Daneil Dolin, MD;  Location: [facility];  Service: Endoscopy;;   BIOPSY  04/26/2014   Procedure: BIOPSIES;  Surgeon: Danie Binder, MD;  Location: [facility];  Service: Endoscopy;;   BIOPSY  12/24/2018   Procedure: BIOPSY;  Surgeon: Daneil Dolin, MD;  [facility];  Service: Endoscopy;;  colon   CHOLECYSTECTOMY     COLONOSCOPY     in [place]   COLONOSCOPY WITH PROPOFOL N/A 05/20/2013   Dr.Rourk- inadequate prep, normal appearing rectum, grossly normal colon aside from pancolonic diverticula, normal terminal ileum bx= unremarkable colonic mucosa. Due for early interval 2016.    COLONOSCOPY WITH PROPOFOL N/A 04/26/2014   [id number] ileum/one colon polyp removed/moderate pan-colonic diverticulosis/large internal hemorrhoids   COLONOSCOPY WITH PROPOFOL N/A 12/23/2014   Dr.Rourk- minimal internal hemorrhoids, pancolonic diverticulosis   COLONOSCOPY WITH PROPOFOL N/A 08/23/2016   Procedure: COLONOSCOPY WITH PROPOFOL;  Surgeon: Danie Binder, MD;  [facility];  Service: Endoscopy;  Laterality: N/A;   COLONOSCOPY WITH PROPOFOL N/A 12/24/2018   Pancolonic diverticulosis, normal TI, one 5 mm polyp  in rectum (tubular adenoma). Somewhat friable hemorrhagic mucosa in area of IC valve/cecum, possibly related to scope trauma s/p biopsy. Repeat  in 7 years.   DEBRIDEMENT OF ABDOMINAL WALL ABSCESS N/A 02/08/2013   Procedure: DEBRIDEMENT OF ABDOMINAL WALL ABSCESS;  Surgeon: Jamesetta So, MD;  [facility]: [facility];  Service: General;  Laterality: N/A;   ESOPHAGOGASTRODUODENOSCOPY (EGD) WITH PROPOFOL N/A 05/20/2013   Dr.Rourk- s/p prior gastric surgery with normal esophagus, residual gastric mucosa and patent efferent limb   ESOPHAGOGASTRODUODENOSCOPY (EGD) WITH PROPOFOL N/A 02/03/2014   Dr. Gala Romney:  s/p hemigastrectomy with retained gastric contents. Residual gastric mucosa and efferent limb appeared normal otherwise. Query gastroparesis.    ESOPHAGOGASTRODUODENOSCOPY (EGD) WITH PROPOFOL N/A 04/26/2014   [id number] ring/small HH/mild non-erosive gasrtitis/normal anastomosis   ESOPHAGOGASTRODUODENOSCOPY (EGD) WITH PROPOFOL N/A 12/23/2014   Dr.Rourk- s/p prior hemigastrctomy, active oozing from anastomotic suture site, hemostasis achieved   ESOPHAGOGASTRODUODENOSCOPY (EGD) WITH PROPOFOL N/A 05/23/2016   Dr. 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    ESOPHAGOGASTRODUODENOSCOPY (EGD) WITH PROPOFOL N/A 05/11/2017   Procedure: ESOPHAGOGASTRODUODENOSCOPY (EGD) WITH PROPOFOL;  Surgeon: Daneil Dolin, MD;  [facility];  Service: Endoscopy;  Laterality: N/A;   ESOPHAGOGASTRODUODENOSCOPY (EGD) WITH PROPOFOL N/A 06/07/2021   Procedure: ESOPHAGOGASTRODUODENOSCOPY (EGD) WITH PROPOFOL;  Surgeon: Daneil Dolin, MD;  [facility];  Service: Endoscopy;  Laterality: N/A;  11:45am   ESOPHAGOGASTRODUODENOSCOPY (EGD) WITH PROPOFOL N/A 10/01/2021   Procedure: ESOPHAGOGASTRODUODENOSCOPY (EGD) WITH PROPOFOL;  Surgeon: Eloise Harman, DO;  [facility];  Service: Endoscopy;  Laterality: N/A;   3:00pm   EXCISION OF MESH  08/2019   HEMORRHOID SURGERY N/A 06/18/2017   Procedure: THREE COLUMN EXTENSIVE HEMORRHOIDECTOMY;  Surgeon: Virl Cagey, MD;  Location: [facility];  Service: General;  Laterality: N/A;   HERNIA REPAIR     INCISION AND DRAINAGE ABSCESS N/A 01/06/2017   Procedure: INCISION AND DRAINAGE ABDOMINAL WALL ABSCESS;  Surgeon: Aviva Signs, MD;  Location: [facility];  Service: General;  Laterality: N/A;   incisional hernial repair  09/05/2021   [place]   LEFT HEART CATH AND CORONARY ANGIOGRAPHY N/A 05/09/2020   Procedure: LEFT HEART CATH AND CORONARY ANGIOGRAPHY;  Surgeon: Nigel Mormon, MD;  Location: [facility];  Service: Cardiovascular;  Laterality: N/A;   POLYPECTOMY  12/24/2018   Procedure: POLYPECTOMY;  Surgeon: Daneil Dolin, MD;  [facility];  Service: Endoscopy;;  colon   tendon repar Right    wrist   WOUND EXPLORATION Right 06/24/2013   Procedure: exploration of traumatic wound right wrist;  Surgeon: Tennis Must, MD;  Location: [facility];  Service: Orthopedics;  Laterality: Right;   Social History   Social History Narrative   Caffeine- tea occass   Immunization History  Administered Date(s) Administered   Influenza,inj,Quad PF,6+ Mos 02/08/2013, 04/02/2014, 12/23/2014, 12/03/2016   Influenza-Unspecified 02/08/2013, 04/02/2014, 12/23/2014, 12/03/2016   Pneumococcal Polysaccharide-23 06/25/2013, 12/03/2016, 11/12/2018   Tdap 03/16/2013, 06/24/2013     Objective: Vital Signs: LMP  (LMP Unknown)    Physical Exam   Musculoskeletal Exam: ***  CDAI Exam: CDAI Score: -- Patient Global: --; Provider Global: -- Swollen: --; Tender: -- Joint Exam 05/02/2022   No joint exam has been documented for this visit   There is currently no information documented on the homunculus. Go to the Rheumatology activity and complete the homunculus joint exam.  Investigation: No additional findings.  Imaging: No results found.  Recent  Labs: Lab Results  Component Value Date   WBC 7.5 10/06/2021   HGB 11.3 (  L) 10/06/2021   PLT 348 10/06/2021   NA 140 10/06/2021   K 3.4 (L) 10/06/2021   CL 111 10/06/2021   CO2 20 (L) 10/06/2021   GLUCOSE 97 10/06/2021   BUN 7 10/06/2021   CREATININE 0.65 10/06/2021   BILITOT 0.8 10/06/2021   ALKPHOS 69 10/06/2021   AST 95 (H) 10/06/2021   ALT 23 10/06/2021   PROT 7.0 10/06/2021   ALBUMIN 3.7 10/06/2021   CALCIUM 8.8 (L) 10/06/2021   GFRAA 119 05/05/2020    Speciality Comments: PLQ Eye Exam 08/10/2021 [place] f/u 1 month  Procedures:  No procedures performed Allergies: Metoclopramide, Codeine, and Reglan [metoclopramide]   Assessment / Plan:     Visit Diagnoses: No diagnosis found.  ***  Orders: No orders of the defined types were placed in this encounter.  No orders of the defined types were placed in this encounter.    Follow-Up Instructions: No follow-ups on file.   Collier Salina, MD  Note - This record has been created using [employer].  Chart creation errors have been sought, but may not always  have been located. Such creation errors do not reflect on  the standard of medical care.

## 2022-05-02 ENCOUNTER — Ambulatory Visit: Payer: Medicare Other | Attending: Internal Medicine | Admitting: Internal Medicine

## 2022-05-02 ENCOUNTER — Encounter: Payer: Self-pay | Admitting: Internal Medicine

## 2022-05-02 VITALS — BP 186/120 | HR 61 | Resp 16 | Ht 63.0 in | Wt 151.0 lb

## 2022-05-02 DIAGNOSIS — M329 Systemic lupus erythematosus, unspecified: Secondary | ICD-10-CM | POA: Diagnosis not present

## 2022-05-02 DIAGNOSIS — I10 Essential (primary) hypertension: Secondary | ICD-10-CM

## 2022-05-02 DIAGNOSIS — M059 Rheumatoid arthritis with rheumatoid factor, unspecified: Secondary | ICD-10-CM | POA: Insufficient documentation

## 2022-05-16 ENCOUNTER — Emergency Department (HOSPITAL_COMMUNITY)
Admission: EM | Admit: 2022-05-16 | Discharge: 2022-05-16 | Disposition: A | Discharge status: Home / Self Care | Payer: Medicare Other | Attending: Emergency Medicine | Admitting: Emergency Medicine

## 2022-05-16 ENCOUNTER — Emergency Department (HOSPITAL_COMMUNITY): Payer: Medicare Other

## 2022-05-16 ENCOUNTER — Encounter (HOSPITAL_COMMUNITY): Payer: Self-pay

## 2022-05-16 ENCOUNTER — Other Ambulatory Visit: Payer: Self-pay

## 2022-05-16 DIAGNOSIS — M792 Neuralgia and neuritis, unspecified: Secondary | ICD-10-CM

## 2022-05-16 DIAGNOSIS — G629 Polyneuropathy, unspecified: Secondary | ICD-10-CM | POA: Diagnosis not present

## 2022-05-16 DIAGNOSIS — R531 Weakness: Secondary | ICD-10-CM | POA: Diagnosis present

## 2022-05-16 DIAGNOSIS — Z7982 Long term (current) use of aspirin: Secondary | ICD-10-CM | POA: Insufficient documentation

## 2022-05-16 LAB — COMPREHENSIVE METABOLIC PANEL
ALT: 22 U/L (ref 0–44)
AST: 20 U/L (ref 15–41)
Albumin: 3.7 g/dL (ref 3.5–5.0)
Alkaline Phosphatase: 88 U/L (ref 38–126)
Anion gap: 7 (ref 5–15)
BUN: 5 mg/dL — ABNORMAL LOW (ref 6–20)
CO2: 25 mmol/L (ref 22–32)
Calcium: 8.7 mg/dL — ABNORMAL LOW (ref 8.9–10.3)
Chloride: 105 mmol/L (ref 98–111)
Creatinine, Ser: 0.48 mg/dL (ref 0.44–1.00)
GFR, Estimated: >60 mL/min (ref >60)
Glucose, Bld: 105 mg/dL — ABNORMAL HIGH (ref 70–99)
Potassium: 3.7 mmol/L (ref 3.5–5.1)
Sodium: 137 mmol/L (ref 135–145)
Total Bilirubin: 0.7 mg/dL (ref 0.3–1.2)
Total Protein: 7.2 g/dL (ref 6.5–8.1)

## 2022-05-16 LAB — CBC WITH DIFFERENTIAL/PLATELET
Abs Immature Granulocytes: 0.01 10*3/uL (ref 0.00–0.07)
Basophils Absolute: 0 10*3/uL (ref 0.0–0.1)
Basophils Relative: 1 %
Eosinophils Absolute: 0.2 10*3/uL (ref 0.0–0.5)
Eosinophils Relative: 3 %
HCT: 33.2 % — ABNORMAL LOW (ref 36.0–46.0)
Hemoglobin: 11.6 g/dL — ABNORMAL LOW (ref 12.0–15.0)
Immature Granulocytes: 0 %
Lymphocytes Relative: 36 %
Lymphs Abs: 2.2 10*3/uL (ref 0.7–4.0)
MCH: 25.5 pg — ABNORMAL LOW (ref 26.0–34.0)
MCHC: 34.9 g/dL (ref 30.0–36.0)
MCV: 73 fL — ABNORMAL LOW (ref 80.0–100.0)
Monocytes Absolute: 0.8 10*3/uL (ref 0.1–1.0)
Monocytes Relative: 13 %
Neutro Abs: 2.8 10*3/uL (ref 1.7–7.7)
Neutrophils Relative %: 47 %
Platelets: 325 10*3/uL (ref 150–400)
RBC: 4.55 MIL/uL (ref 3.87–5.11)
RDW: 17.7 % — ABNORMAL HIGH (ref 11.5–15.5)
WBC: 6 10*3/uL (ref 4.0–10.5)
nRBC: 0 % (ref 0.0–0.2)

## 2022-05-16 LAB — TROPONIN I (HIGH SENSITIVITY): Troponin I (High Sensitivity): 2 ng/L (ref <18)

## 2022-05-16 LAB — D-DIMER, QUANTITATIVE: D-Dimer, Quant: 1.44 ug/mL-FEU — ABNORMAL HIGH (ref 0.00–0.50)

## 2022-05-16 MED ORDER — HYDROMORPHONE HCL 1 MG/ML IJ SOLN
0.5000 mg | Freq: Once | INTRAMUSCULAR | Status: AC
  Administered 2022-05-16: 0.5 mg via INTRAVENOUS
  Filled 2022-05-16: qty 0.5

## 2022-05-16 MED ORDER — LORAZEPAM 2 MG/ML IJ SOLN
0.5000 mg | Freq: Once | INTRAMUSCULAR | Status: AC
  Administered 2022-05-16: 0.5 mg via INTRAVENOUS
  Filled 2022-05-16: qty 1

## 2022-05-16 MED ORDER — PREDNISONE 20 MG PO TABS: ORAL_TABLET | ORAL | 0 refills | Status: DC

## 2022-05-16 MED ORDER — OXYCODONE-ACETAMINOPHEN 5-325 MG PO TABS: 1.0000 | ORAL_TABLET | Freq: Four times a day (QID) | ORAL | 0 refills | Status: DC | PRN

## 2022-05-16 NOTE — ED Triage Notes (Signed)
Pt complaining of shortness of breath that started on Monday, left arm starts going numb off and on starting on Tuesday night. Said the symptoms will not go away.

## 2022-05-16 NOTE — Discharge Instructions (Signed)
Follow-up with your family doctor next week for recheck

## 2022-05-16 NOTE — ED Provider Notes (Signed)
[facility] Provider Note   CSN: [id number] Arrival date & time: 05/16/22  1111     History  Chief Complaint  Patient presents with   Shortness of Breath    Angel Price is a 57 y.o. female.  Patient has a history of lupus.  She complains of pain going down her left arm for the last couple days.  The history is provided by the patient and medical records. No language interpreter was used.  Weakness Severity:  Mild Onset quality:  Sudden Timing:  Constant Progression:  Worsening Chronicity:  New Context: not alcohol use   Relieved by:  Nothing Worsened by:  Nothing Ineffective treatments:  None tried Associated symptoms: no abdominal pain, no chest pain, no cough, no diarrhea, no frequency, no headaches and no seizures        Home Medications Prior to Admission medications   Medication Sig Start Date End Date Taking? Authorizing Provider  oxyCODONE-acetaminophen (PERCOCET/ROXICET) 5-325 MG tablet Take 1 tablet by mouth every 6 (six) hours as needed for severe pain. 05/16/22  Yes Milton Ferguson, MD  predniSONE (DELTASONE) 20 MG tablet 2 tabs po daily x 3 days 05/16/22  Yes Milton Ferguson, MD  amLODipine (NORVASC) 10 MG tablet Take 10 mg by mouth daily. 05/18/21   [provider]  aspirin EC 81 MG EC tablet Take 1 tablet (81 mg total) by mouth daily. 05/19/19   Elgergawy, Silver Huguenin, MD  atenolol (TENORMIN) 50 MG tablet Take 50 mg by mouth daily. 05/18/21   [provider]  baclofen (LIORESAL) 10 MG tablet Take 1 tablet 3 times a day by oral route.    [provider]  Cholecalciferol 50 MCG (2000 UT) CAPS Take 1 tablet by mouth daily.    [provider]  diltiazem (CARDIZEM CD) 240 MG 24 hr capsule Take 1 capsule every day by oral route.    [provider]  Docusate Sodium (DSS) 100 MG CAPS Take 1 capsule by mouth 2 (two) times daily.    [provider]  DULoxetine (CYMBALTA) 30 MG  capsule Take 30 mg by mouth 2 (two) times daily. 08/14/21   [provider]  escitalopram (LEXAPRO) 20 MG tablet Take 1 tablet by mouth daily.    [provider]  esomeprazole (NEXIUM) 40 MG capsule Take 1 capsule (40 mg total) by mouth 2 (two) times daily before a meal. Patient taking differently: Take 40 mg by mouth daily before breakfast. 09/14/21   Erenest Rasher, PA-C  hydroxychloroquine (PLAQUENIL) 200 MG tablet Take 1.5 tablets (300 mg total) by mouth daily. 03/08/21   Collier Salina, MD  ibuprofen (ADVIL) 800 MG tablet Take 1 tablet by mouth 2 (two) times daily as needed.    [provider]  isosorbide mononitrate (IMDUR) 60 MG 24 hr tablet TAKE 1/2 TABLET BY MOUTH DAILY 06/25/21   Patwardhan, Manish J, MD  levocetirizine (XYZAL) 5 MG tablet Take 1 tablet by mouth daily.    [provider]  lisinopril-hydrochlorothiazide (ZESTORETIC) 20-12.5 MG tablet Take 1 tablet by mouth every morning. 10/31/20   [provider]  methocarbamol (ROBAXIN) 500 MG tablet Take 500 mg by mouth 2 (two) times daily. 10/31/21   [provider]  mirtazapine (REMERON) 15 MG tablet Take 15 mg by mouth at bedtime as needed. 08/10/21   [provider]  nitroGLYCERIN (NITROSTAT) 0.4 MG SL tablet Place 1 tablet (0.4 mg total) under the tongue every 5 (  five) minutes as needed for chest pain. 12/29/19 09/21/21  Patwardhan, Reynold Bowen, MD  oxyCODONE (OXY IR/ROXICODONE) 5 MG immediate release tablet Take by mouth. 01/03/22   [provider]  pantoprazole (PROTONIX) 40 MG tablet Take 1 tablet by mouth daily.    [provider]  prochlorperazine (COMPAZINE) 10 MG tablet     [provider]  promethazine (PHENERGAN) 12.5 MG tablet Take 12.5 mg by mouth every 6 (six) hours as needed for nausea or vomiting.  12/18/19   [provider]  Rimegepant Sulfate (NURTEC) 75 MG TBDP Take 75 mg by mouth as needed. 09/12/21   Genia Harold, MD   rosuvastatin (CRESTOR) 20 MG tablet Take 1 tablet (20 mg total) by mouth daily. 06/25/19 11/06/28  Patwardhan, Reynold Bowen, MD  SUMAtriptan (IMITREX) 50 MG tablet TAKE 1 TABLET BY MOUTH EVERY 2 HOURS AS NEEDED FOR MIGRAINES MAY REPEAT IN 2 HOURS    [provider]  thiamine 100 MG tablet Take 100 mg by mouth daily. 05/18/21   [provider]      Allergies    Metoclopramide, Codeine, and Reglan [metoclopramide]    Review of Systems   Review of Systems  Constitutional:  Negative for appetite change and fatigue.  HENT:  Negative for congestion, ear discharge and sinus pressure.   Eyes:  Negative for discharge.  Respiratory:  Negative for cough.   Cardiovascular:  Negative for chest pain.  Gastrointestinal:  Negative for abdominal pain and diarrhea.  Genitourinary:  Negative for frequency and hematuria.  Musculoskeletal:  Negative for back pain.       Numbness to left arm  Skin:  Negative for rash.  Neurological:  Positive for weakness. Negative for seizures and headaches.  Psychiatric/Behavioral:  Negative for hallucinations.     Physical Exam Updated Vital Signs BP (!) 167/108   Pulse 85   Temp 98.1 F (36.7 C) (Oral)   Ht '5\' 3"'$  (1.6 m)   Wt 68.5 kg   LMP  (LMP Unknown)   SpO2 96%   BMI 26.75 kg/m  Physical Exam Vitals and nursing note reviewed.  Constitutional:      Appearance: She is well-developed.  HENT:     Head: Normocephalic.     Nose: Nose normal.  Eyes:     General: No scleral icterus.    Conjunctiva/sclera: Conjunctivae normal.  Neck:     Thyroid: No thyromegaly.  Cardiovascular:     Rate and Rhythm: Normal rate and regular rhythm.     Heart sounds: No murmur heard.    No friction rub. No gallop.  Pulmonary:     Breath sounds: No stridor. No wheezing or rales.  Chest:     Chest wall: No tenderness.  Abdominal:     General: There is no distension.     Tenderness: There is no abdominal tenderness. There is no rebound.  Musculoskeletal:         General: Normal range of motion.     Cervical back: Neck supple.     Comments: Some decreased sensation left arm but strength could  Lymphadenopathy:     Cervical: No cervical adenopathy.  Skin:    Findings: No erythema or rash.  Neurological:     Mental Status: She is alert and oriented to person, place, and time.     Motor: No abnormal muscle tone.     Coordination: Coordination normal.  Psychiatric:        Behavior: Behavior normal.     ED  Results / Procedures / Treatments   Labs (all labs ordered are listed, but only abnormal results are displayed) Labs Reviewed  D-DIMER, QUANTITATIVE - Abnormal; Notable for the following components:      Result Value   D-Dimer, Quant 1.44 (*)    All other components within normal limits  CBC WITH DIFFERENTIAL/PLATELET - Abnormal; Notable for the following components:   Hemoglobin 11.6 (*)    HCT 33.2 (*)    MCV 73.0 (*)    MCH 25.5 (*)    RDW 17.7 (*)    All other components within normal limits  COMPREHENSIVE METABOLIC PANEL - Abnormal; Notable for the following components:   Glucose, Bld 105 (*)    BUN 5 (*)    Calcium 8.7 (*)    All other components within normal limits  TROPONIN I (HIGH SENSITIVITY)    EKG EKG Interpretation  Date/Time:  Thursday May 16 2022 11:50:31 EST Ventricular Rate:  73 PR Interval:  142 QRS Duration: 80 QT Interval:  434 QTC Calculation: 479 R Axis:   72 Text Interpretation: Sinus rhythm Probable left atrial enlargement Nonspecific T abnrm, anterolateral leads Confirmed by Milton Ferguson [id number]) on 05/16/2022 11:54:17 AM  Radiology MR Cervical Spine Wo Contrast  Result Date: 05/16/2022 CLINICAL DATA:  Myelopathy, acute, cervical spine EXAM: MRI CERVICAL SPINE WITHOUT CONTRAST TECHNIQUE: Multiplanar, multisequence MR imaging of the cervical spine was performed. No intravenous contrast was administered. COMPARISON:  None Available. FINDINGS: Motion limited study despite repeat attempts.  Within this limitation: Alignment: Mild anterolisthesis of C4 on C5. Straightening of the normal cervical lordosis with mild kyphosis at C5-C6. Vertebrae: Motion limited assessment without visible marrow edema to suggest acute fracture or discitis/osteomyelitis. No suspicious bone lesions. Cord: Normal cord signal. Posterior Fossa, vertebral arteries, paraspinal tissues: Limited assessment of the flow voids due to motion. No evidence of acute abnormality in the visualized posterior fossa. Disc levels: Limited assessment due to severe motion on the axials. Within this limitation: C2-C3: No significant stenosis on motion limited assessment. C3-C4: No significant stenosis on motion limited assessment. C4-C5: Small posterior disc osteophyte complex and facet arthropathy without significant stenosis on motion limited assessment. C5-C6: Disc height loss and kyphosis with small posterior disc osteophyte complex and bilateral facet and uncovertebral hypertrophy. No significant stenosis on motion limited assessment. Disc does contact the ventral cord with flattening. C6-C7: Small posterior disc osteophyte complex without significant stenosis on motion limited assessment. C7-T1: Facet arthropathy. No significant stenosis on motion limited assessment. IMPRESSION: Motion limited study (despite repeat attempts) without evidence of compressive stenosis. Electronically Signed   By: Margaretha Sheffield M.D.   On: 05/16/2022 14:27   DG Chest Port 1 View  Result Date: 05/16/2022 CLINICAL DATA:  Shortness of breath EXAM: PORTABLE CHEST 1 VIEW COMPARISON:  Previous studies including the examination done on 08/29/2020 FINDINGS: Transverse diameter of heart is increased. Linear densities seen in right parahilar region. There are no signs of pulmonary edema or focal pulmonary consolidation. There is no pleural effusion or pneumothorax. Surgical clips are seen in the epigastrium. IMPRESSION: Cardiomegaly. There are no signs of pulmonary  edema or focal pulmonary consolidation. Linear density in right parahilar region may suggest scarring or subsegmental atelectasis. Electronically Signed   By: Elmer Picker M.D.   On: 05/16/2022 12:39    Procedures Procedures    Medications Ordered in ED Medications  LORazepam (ATIVAN) injection 0.5 mg (0.5 mg Intravenous Given 05/16/22 1246)  HYDROmorphone (DILAUDID) injection 0.5 mg (0.5 mg Intravenous Given 05/16/22  1315)    ED Course/ Medical Decision Making/ A&P                             Medical Decision Making Amount and/or Complexity of Data Reviewed Labs: ordered. Radiology: ordered.  Risk Prescription drug management.   Patient with peripheral neuritis.  She is placed on prednisone and Percocet and will follow-up with her PCP        Final Clinical Impression(s) / ED Diagnoses Final diagnoses:  Neuritis    Rx / DC Orders ED Discharge Orders          Ordered    predniSONE (DELTASONE) 20 MG tablet        05/16/22 1459    oxyCODONE-acetaminophen (PERCOCET/ROXICET) 5-325 MG tablet  Every 6 hours PRN        05/16/22 1459              Milton Ferguson, MD 05/18/22 [phone]

## 2022-05-16 NOTE — ED Notes (Signed)
Patient transported to MRI 

## 2022-06-05 ENCOUNTER — Ambulatory Visit: Payer: Medicare Other | Admitting: Cardiology

## 2022-06-12 ENCOUNTER — Encounter: Payer: Medicare Other | Admitting: Cardiology

## 2022-06-12 NOTE — Progress Notes (Deleted)
Follow up visit  Subjective:   Angel Price, female    DOB: 11-21-1965, 57 y.o.   MRN: [id number]   No chief complaint on file.   57 y.o. African female with hypertension, h/o tobacco and alcohol dependence, nonobstructive coronary artery disease, suspected microvascular dysfunction  Patient was last seen by me in 2022. Patient was seen at [facility], ER on 05/16/2022 with complaints of shortness of breath.  Has since her troponin was 2.  D-dimer was mildly elevated 1.44, but lower than her previous numbers which have always been >2.  Chest x-ray showed no pulmonary edema, focal consolidation.  There was still a density in the right perihilar region suggesting scarring or subsegmental atelectasis  Initial consultation HPI 06/2019: Patient has been hospitalized twice in last 6 weeks with complaints of loss of consciousness and questionable cardiac arrest.  Both times, patient reportedly had episodes of loss of consciousness, and underwent CPR by first responders, but had pulse present when EMS arrived.  At least 1 of these occasions, patient actually remembers chest compressions being performed.  One of these episodes happened while patient was Covid positive, fortunately without any severe illness related to it.  Both times, she has had elevated alcohol level.  She has also had complaints of unilateral weakness with CT head and MRI being unremarkable.  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   Given left main and triple-vessel calcifications noted, she underwent nuclear stress test which did not show any ischemia.  Echocardiogram showed structurally normal heart without any significant abnormalities.  Patient continues to have constant chest pain throughout the day, only improved when she sleeps at night.  Patient states that she has cut down her alcohol intake to about 1 beer per day.  She smokes 5 cigarettes/day.  In the past, patient used to have a sixpack every  day.  She endorses having had too much to drink when she found out that her godmother passed off of it.  This is about the time she was admitted to the hospital and found to have alcohol level elevated.   Current Outpatient Medications:    amLODipine (NORVASC) 10 MG tablet, Take 10 mg by mouth daily., Disp: , Rfl:    aspirin EC 81 MG EC tablet, Take 1 tablet (81 mg total) by mouth daily., Disp: 30 tablet, Rfl: 0   atenolol (TENORMIN) 50 MG tablet, Take 50 mg by mouth daily., Disp: , Rfl:    baclofen (LIORESAL) 10 MG tablet, Take 1 tablet 3 times a day by oral route., Disp: , Rfl:    Cholecalciferol 50 MCG (2000 UT) CAPS, Take 1 tablet by mouth daily., Disp: , Rfl:    diltiazem (CARDIZEM CD) 240 MG 24 hr capsule, Take 1 capsule every day by oral route., Disp: , Rfl:    Docusate Sodium (DSS) 100 MG CAPS, Take 1 capsule by mouth 2 (two) times daily., Disp: , Rfl:    DULoxetine (CYMBALTA) 30 MG capsule, Take 30 mg by mouth 2 (two) times daily., Disp: , Rfl:    escitalopram (LEXAPRO) 20 MG tablet, Take 1 tablet by mouth daily., Disp: , Rfl:    esomeprazole (NEXIUM) 40 MG capsule, Take 1 capsule (40 mg total) by mouth 2 (two) times daily before a meal. (Patient taking differently: Take 40 mg by mouth daily before breakfast.), Disp: 60 capsule, Rfl: 3   hydroxychloroquine (PLAQUENIL) 200 MG tablet, Take 1.5 tablets (300 mg total) by mouth daily., Disp: 135 tablet, Rfl:  1   ibuprofen (ADVIL) 800 MG tablet, Take 1 tablet by mouth 2 (two) times daily as needed., Disp: , Rfl:    isosorbide mononitrate (IMDUR) 60 MG 24 hr tablet, TAKE 1/2 TABLET BY MOUTH DAILY, Disp: 90 tablet, Rfl: 3   levocetirizine (XYZAL) 5 MG tablet, Take 1 tablet by mouth daily., Disp: , Rfl:    lisinopril-hydrochlorothiazide (ZESTORETIC) 20-12.5 MG tablet, Take 1 tablet by mouth every morning., Disp: , Rfl:    methocarbamol (ROBAXIN) 500 MG tablet, Take 500 mg by mouth 2 (two) times daily., Disp: , Rfl:    mirtazapine (REMERON) 15 MG  tablet, Take 15 mg by mouth at bedtime as needed., Disp: , Rfl:    nitroGLYCERIN (NITROSTAT) 0.4 MG SL tablet, Place 1 tablet (0.4 mg total) under the tongue every 5 (five) minutes as needed for chest pain., Disp: 30 tablet, Rfl: 3   oxyCODONE (OXY IR/ROXICODONE) 5 MG immediate release tablet, Take by mouth., Disp: , Rfl:    oxyCODONE-acetaminophen (PERCOCET/ROXICET) 5-325 MG tablet, Take 1 tablet by mouth every 6 (six) hours as needed for severe pain., Disp: 20 tablet, Rfl: 0   pantoprazole (PROTONIX) 40 MG tablet, Take 1 tablet by mouth daily., Disp: , Rfl:    predniSONE (DELTASONE) 20 MG tablet, 2 tabs po daily x 3 days, Disp: 6 tablet, Rfl: 0   prochlorperazine (COMPAZINE) 10 MG tablet, , Disp: , Rfl:    promethazine (PHENERGAN) 12.5 MG tablet, Take 12.5 mg by mouth every 6 (six) hours as needed for nausea or vomiting. , Disp: , Rfl:    Rimegepant Sulfate (NURTEC) 75 MG TBDP, Take 75 mg by mouth as needed., Disp: 8 tablet, Rfl: 6   rosuvastatin (CRESTOR) 20 MG tablet, Take 1 tablet (20 mg total) by mouth daily., Disp: 90 tablet, Rfl: 3   SUMAtriptan (IMITREX) 50 MG tablet, TAKE 1 TABLET BY MOUTH EVERY 2 HOURS AS NEEDED FOR MIGRAINES MAY REPEAT IN 2 HOURS, Disp: , Rfl:    thiamine 100 MG tablet, Take 100 mg by mouth daily., Disp: , Rfl:   Cardiovascular & other pertient studies:  EKG 10/20/2020: Sinus rhythm 73 bpm  Nonspecific T wave inverison anteroseptal leads  CXR 05/16/2022: Cardiomegaly. There are no signs of pulmonary edema or focal pulmonary consolidation.   Linear density in right parahilar region may suggest scarring or subsegmental atelectasis.    Cardiology monitoring reviewed 08/23/2020: Multiple episodes of sinus tachycardia up to 130 bpm correlating with patient symptoms of palpitations. No other tachyarrhythmias seen.  Coronary angiography 05/09/2020: LM: Normal LAD: Prox 20% calcific disease        Diag 2 ostial 50%, prox 40% stenoses LCx: No significant disease RCA:  Distal RCA focal 40% stenosis   TIMI II flow in all vessels in spite of IC NTG suggests possible microvascular dysfunction    EKG 04/27/2020: Sinus rhythm 88 bpm Normal EKG  ABI 01/04/2020: This exam reveals normal perfusion of the right and left lower extremity  (ABI 1.00) with normal triphasic waveform pattern at the ankles.  Recent labs: 05/16/2022: Glucose 105, BUN/Cr 5/0.48. EGFR >60. Na/K 137/3.7. Rest of the CMP normal H/H 11/33. MCV 73. Platelets 325 Trop HS 2  2018: Chol 165, TG 166, HDL 33, LDL 99 TSH 1.5 normal    Review of Systems  Cardiovascular:  Positive for palpitations. Negative for chest pain, dyspnea on exertion, leg swelling and syncope.        There were no vitals filed for this visit.    There  is no height or weight on file to calculate BMI. There were no vitals filed for this visit.    Objective:   Physical Exam Vitals and nursing note reviewed.  Constitutional:      Appearance: She is well-developed.  Neck:     Vascular: No JVD.  Cardiovascular:     Rate and Rhythm: Regular rhythm. Tachycardia present.     Pulses: Intact distal pulses.          Dorsalis pedis pulses are 0 on the right side and 0 on the left side.       Posterior tibial pulses are 1+ on the right side and 1+ on the left side.     Heart sounds: Normal heart sounds. No murmur heard. Pulmonary:     Effort: Pulmonary effort is normal.     Breath sounds: Normal breath sounds. No wheezing or rales.  Musculoskeletal:     Right lower leg: No edema.     Left lower leg: No edema.           Assessment & Recommendations:   57 y.o. African female with hypertension, h/o tobacco and alcohol dependence, coronary artery disease, suspected microvascular dysfunction  CAD with angina:  No obstructive CAD. Suspect microvascular dysfunction (Coronary angiography 04/2020) Constant chest and arm pain without any evidence of ischemia on EKG or troponin. Her pain could be noncardiac.   However, given her slow flow seen on coronary angiogram, microvascular dysfunction is a possibility.  I will change her amlodipine 10 mg to a nondiabetic 213 calcium channel blocker such as diltiazem 240 mg daily.  Resting heart rate is high enough, that she should tolerate concurrent use of beta-blocker and calcium channel blocker., likely so much absolutely Continue rest of the medical therapy. Currently on Imdur 60 mg daily, atenolol 100 mg daily. Continue rosuvastatin. Check lipid panel  Fu in 4 weeks    Esther Hardy, MD [facility] Pager: [phone] Office: [phone]

## 2022-06-27 ENCOUNTER — Ambulatory Visit: Payer: Medicare Other | Admitting: Cardiology

## 2022-09-20 IMAGING — DX DG ABDOMEN 2V
2 series · 2 of 2 positions shown · non-contrast
Comparison: Chest and abdominal radiographs 08/29/2020

CLINICAL DATA: Nausea and vomiting.  Assess stool load.

EXAM:
ABDOMEN - 2 VIEW

[abdomen erect]
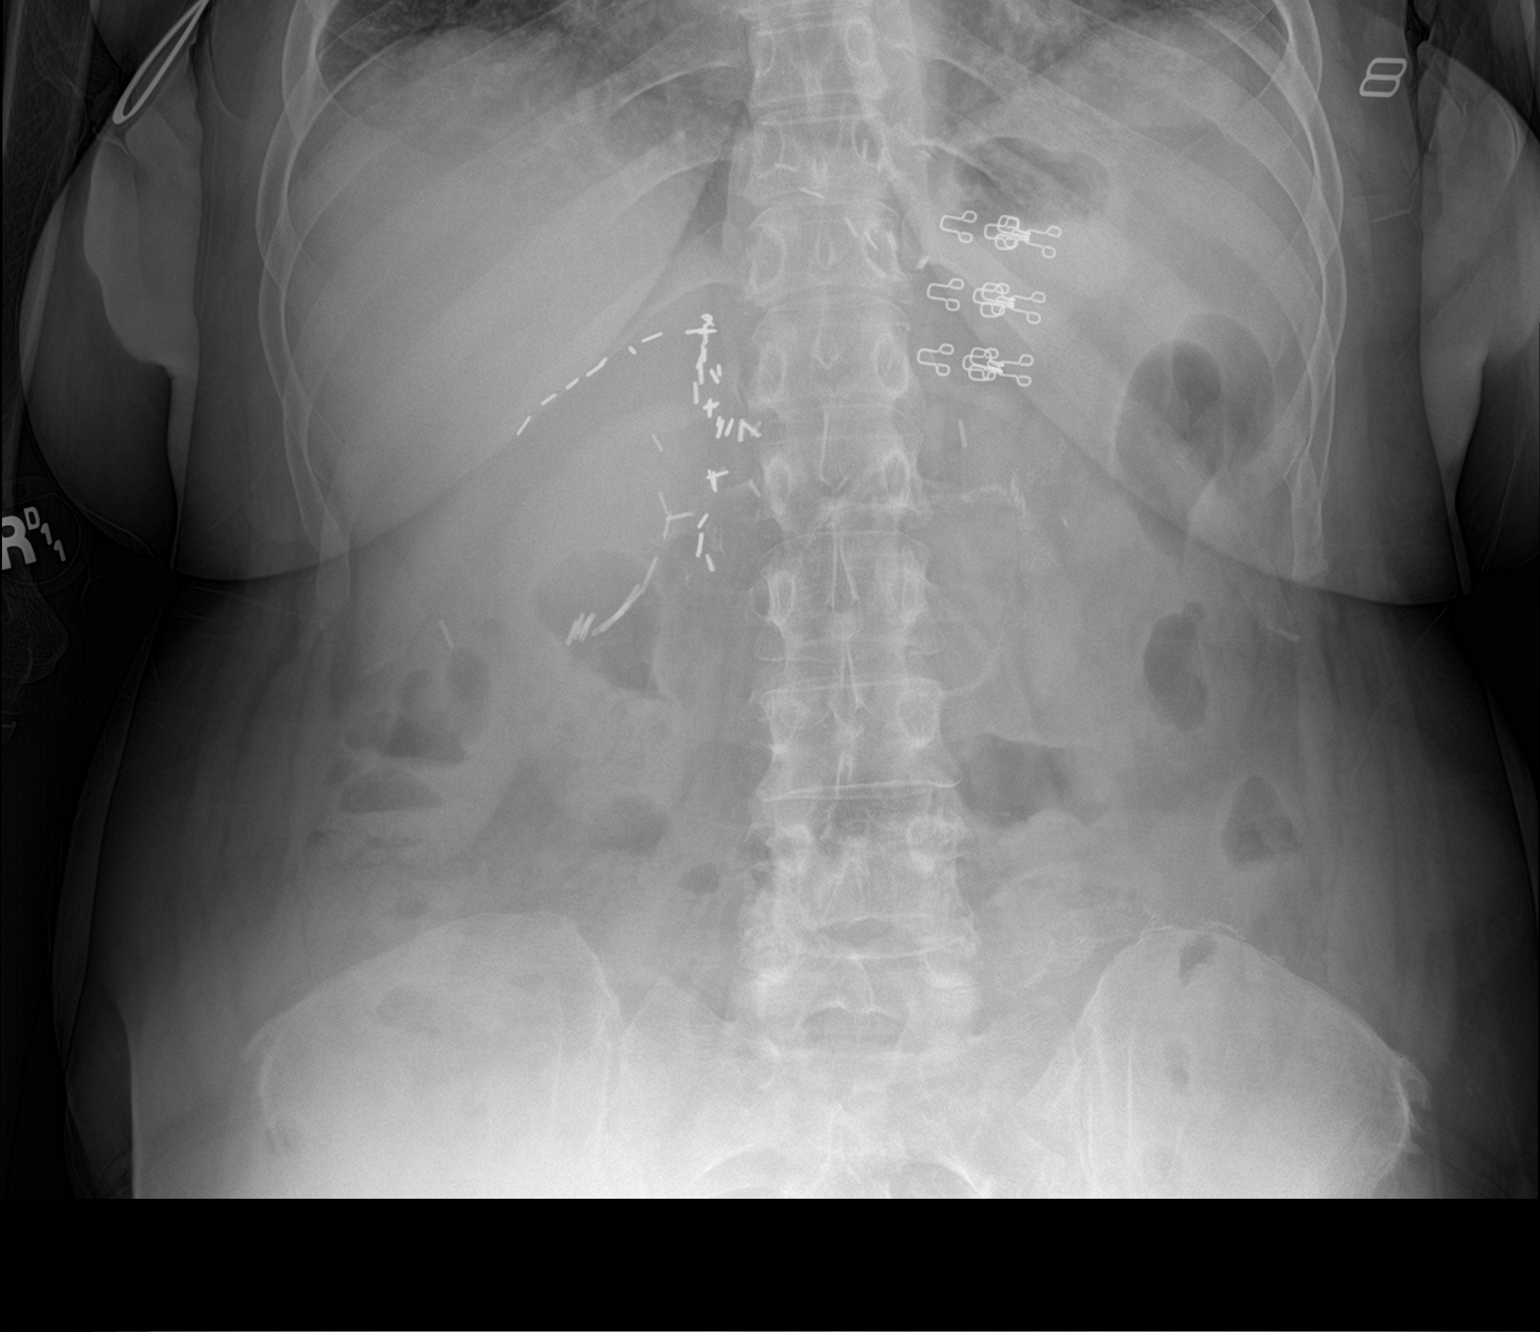

[abdomen supine]
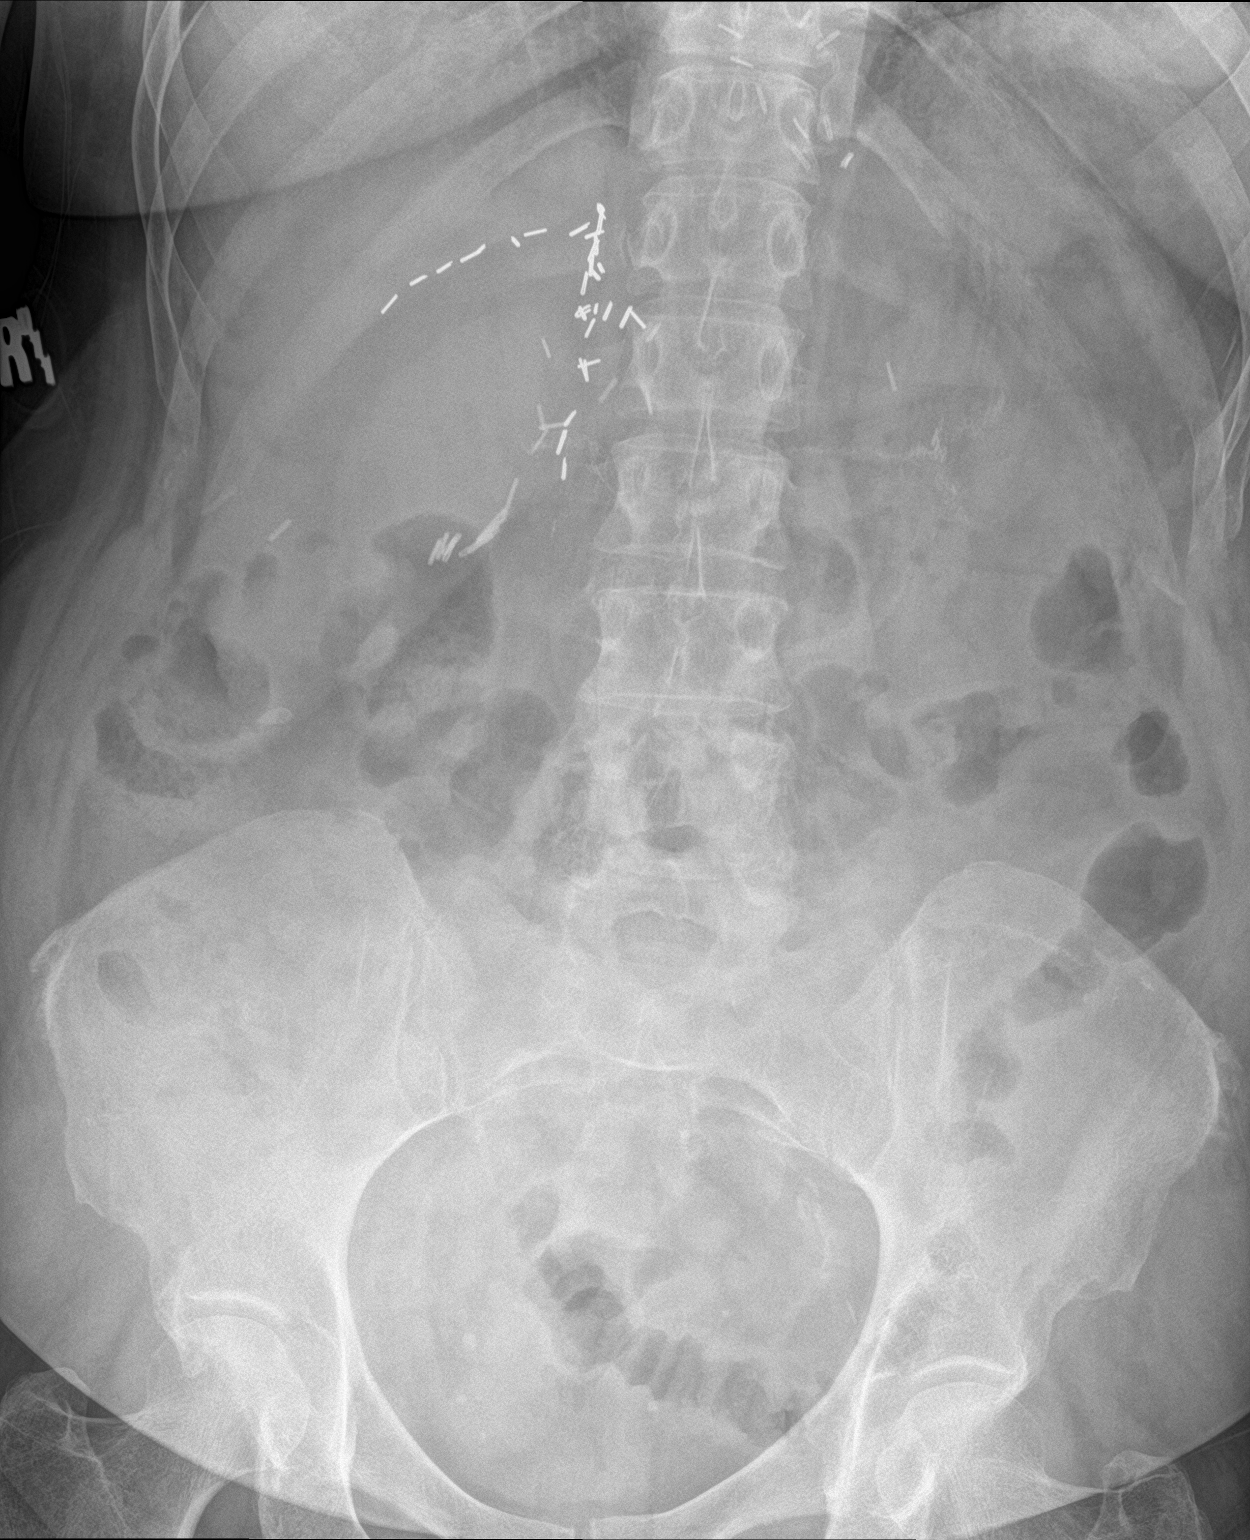

[2 of 2 positions shown; findings below may reference images not displayed]

FINDINGS: There again numerous surgical clips within the right upper quadrant.
Surgical suture is again seen within the left upper quadrant. Mild
stool is seen within the ascending colon mildly decreased from
prior. Air is seen within nondistended loops of small and large
bowel. No subdiaphragmatic free air on upright view. No portal
venous gas or pneumatosis. Vascular calcifications are noted. Mild
degenerative disc changes of the lumbar spine.
IMPRESSION: Only mild stool burden.  No evidence of bowel obstruction.

## 2022-09-29 IMAGING — DX DG ABDOMEN 2V
2 series · 2 of 2 positions shown · non-contrast
Comparison: Abdominal radiograph dated May 15, 2021

CLINICAL DATA: Vomiting with abdominal pain and diarrhea

EXAM:
ABDOMEN - 2 VIEW

[abdomen erect]
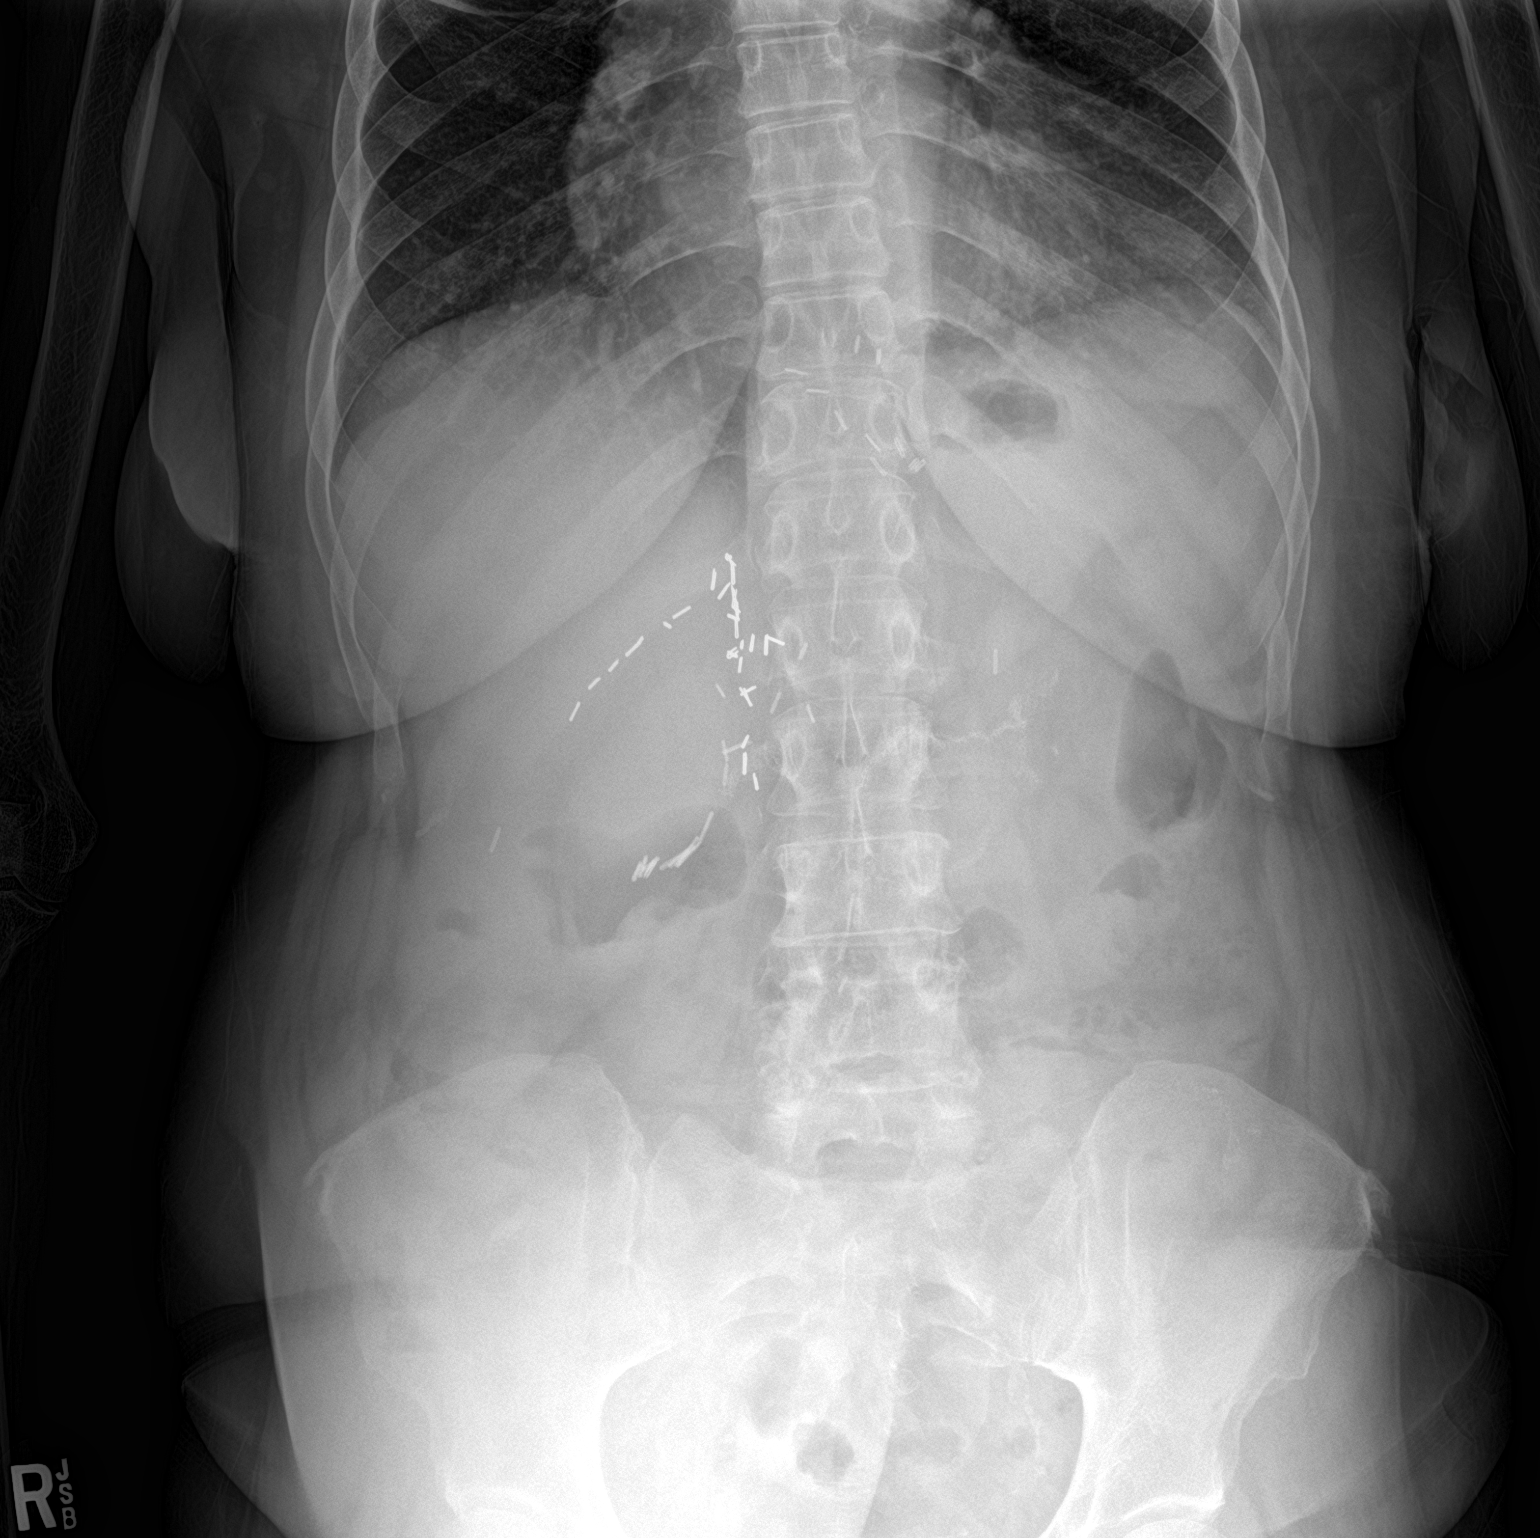

[abdomen supine]
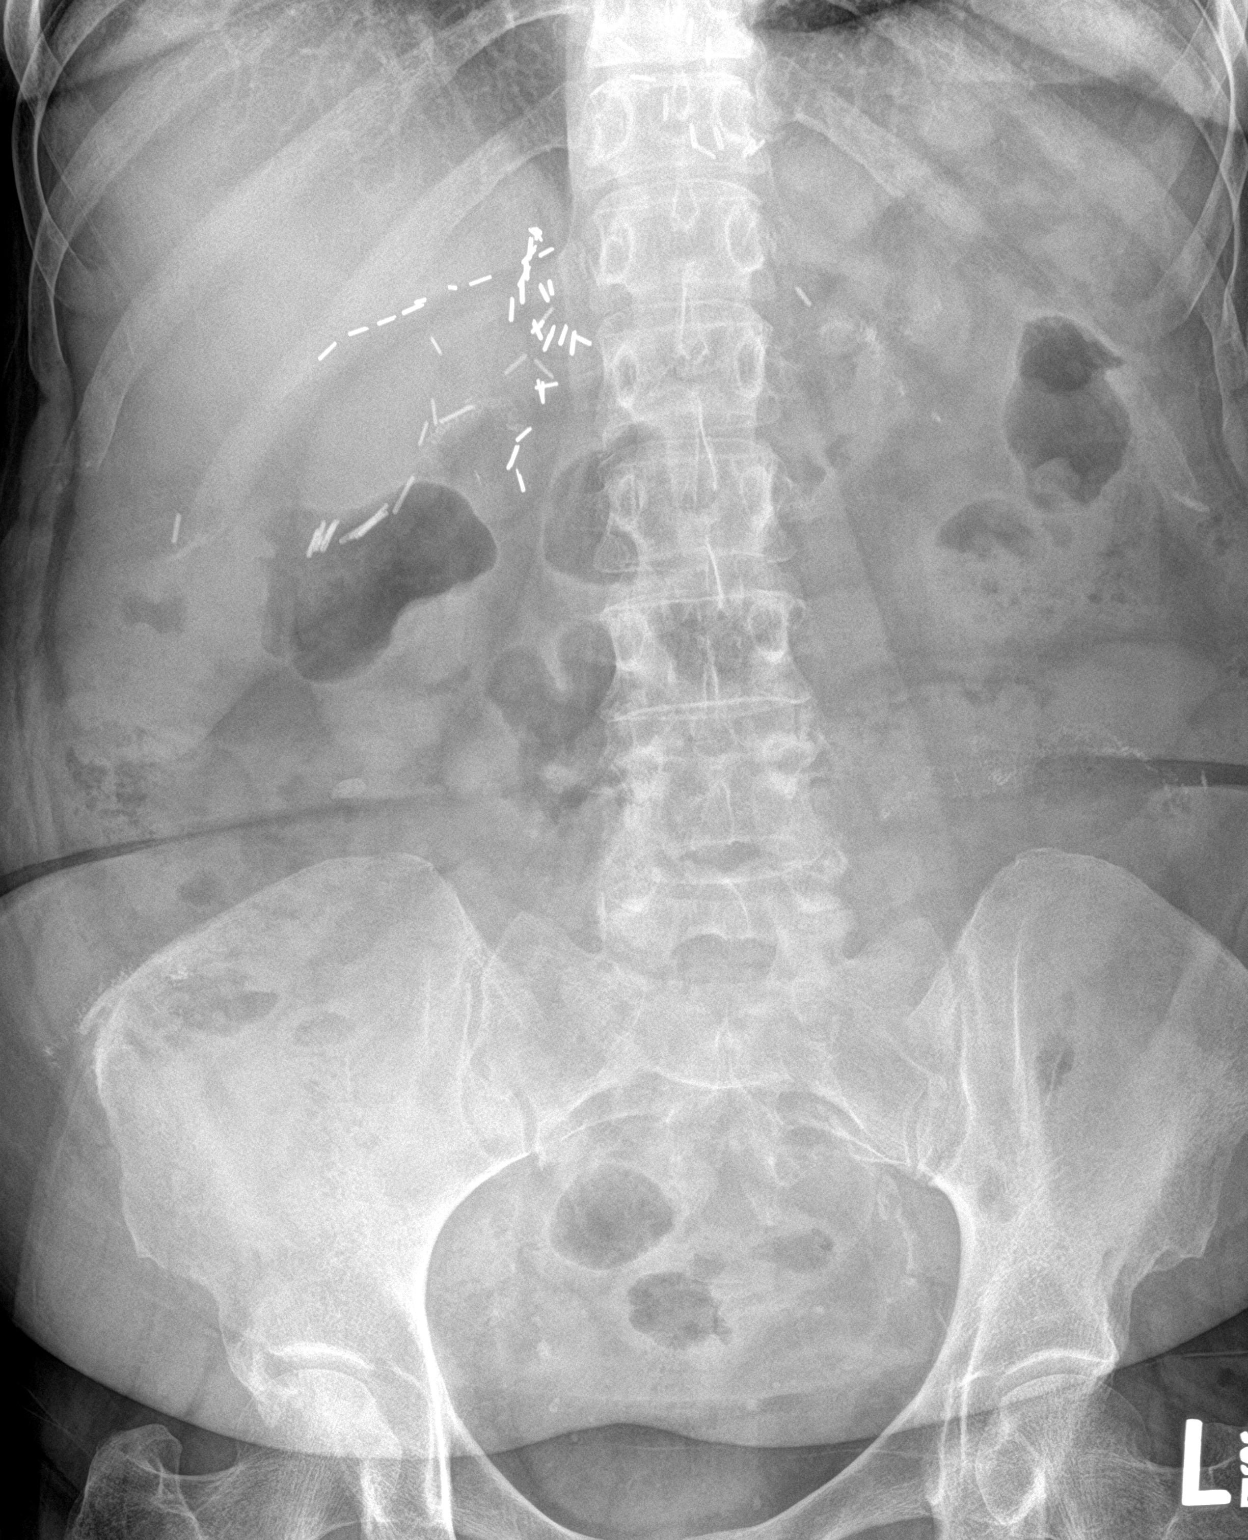

[2 of 2 positions shown; findings below may reference images not displayed]

FINDINGS: The bowel gas pattern is normal. There is no evidence of free air.
No radio-opaque calculi. Lung bases are clear. Multiple surgical
clips projecting over the right upper quadrant. Degenerate disc
disease of the lower lumbar spine.
IMPRESSION: Nonobstructive bowel gas pattern.  No extraluminal free air.

## 2022-10-18 NOTE — Progress Notes (Signed)
Office Visit Note  Patient: Angel Price             Date of Birth: October 15, 1965           MRN: [id number]             PCP: Benetta Spar, MD Referring: Benetta Spar* Visit Date: 10/31/2022   Subjective:  Follow-up (Patient states her hands are hurting and they do get stiff. )   History of Present Illness: Angel Price is a 57 y.o. female here for follow up for seropositive RA on hydroxychloroquine 300 mg daily.  Since her last visit she is doing worse significant increase in joint pains mostly the hands and knees.  She sees visible swelling affecting her hands most days somewhat worse on the left side.  Knee pain and swelling is also worse in the left side.  Not taking any specific medications for the symptoms.  She saw the emergency department at the end of February for radiating left arm pain thought consistent for neuritis treated with a short course of oral prednisone and oxycodone.  She did not recall any new preceding illness or injury and has been taking hydroxychloroquine consistently.  She is smoking cigarettes.  Previous HPI 05/02/2022 Angel Price is a 57 y.o. female here for follow up for seropositive RA on hydroxychloroquine 300 mg daily.  Since her last visit she has been doing pretty well not having as much persistent hip or knee pain compared to what she felt in December.  Has not experienced any major flareups with peripheral joint swelling.  She denies any serious infections since her last visit.   Previous HPI 03/06/22 Angel Price is a 57 y.o. female here for follow up for seropositive rheumatoid arthritis (RF+, ANA+) on HCQ 300 mg daily. She is experiencing increased pain in bilateral hips and knees with a lot of associated joint popping and feeling of weakness or instability in her legs. This started since about a month ago and she does not recall any events, illness, or medical changes at the time. Not seeing visible swelling in the  areas. She has not fallen. She reports normal feeling in her legs.  She is not taking any specific treatment for these symptoms besides her normal arthritis medication and pain medication. She has not taken any blood pressure medications today and says she never takes these until eating and plans to at lunch.     Previous HPI 12/08/20 Angel Price is a 57 y.o. female here for lupus. 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 Previous serology showed positive ANA and RF. Past imaging showing degenerative changes in lumbar spine.  She has not followed up with any rheumatologist for many years. She recalls use of NSAIDs and steroids and thinks plaquenil sounds familiar but not sure of an other long term lupus treatment. 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  She is currently following with cardiology for angina loss of consciousness no obstructive CAD on extensive work-up including coronary angiography attributed to suspected microvascular dysfunction. She is required abdominal surgeries for gastrointestinal bleeding with dieulafoy lesion of duodenum.  Abdominal mesh surgery for repair of incisional hernia was complicated with infection attributed in part to continue nicotine abuse.   Review of Systems  Constitutional:  Positive for fatigue.  HENT:  Positive for mouth dryness. Negative for mouth sores.   Eyes:  Negative for dryness.  Respiratory:  Negative for shortness of breath.   Cardiovascular:  Positive for palpitations. Negative for chest pain.  Gastrointestinal:  Negative for blood in stool, constipation and diarrhea.  Endocrine: Positive for increased urination.  Genitourinary:  Positive for involuntary urination.  Musculoskeletal:  Positive for joint pain, gait problem, joint  pain, joint swelling, myalgias, muscle weakness, morning stiffness, muscle tenderness and myalgias.  Skin:  Positive for sensitivity to sunlight. Negative for color change, rash and hair loss.  Allergic/Immunologic: Negative for susceptible to infections.  Neurological:  Negative for dizziness and headaches.  Hematological:  Negative for swollen glands.  Psychiatric/Behavioral:  Positive for depressed mood and sleep disturbance. The patient is not nervous/anxious.     PMFS History:  Patient Active Problem List   Diagnosis Date Noted   Seropositive rheumatoid arthritis (HCC) 05/02/2022   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   Cellulitis 10/06/2021   Paroxysmal atrial fibrillation (HCC) 10/06/2021   Abdominal fluid collection 10/06/2021   Constipation 09/14/2021   Ventral hernia 05/15/2021   Dysphagia 05/15/2021   Tobacco dependence 09/12/2020   Continuous dependence on cigarette smoking 08/22/2020   Mixed hyperlipidemia 04/27/2020   Recurrent incisional hernia 02/03/2020   Bilateral knee pain 12/29/2019   Coronary artery disease of native artery of native heart with stable angina pectoris (HCC) 06/25/2019   Acute respiratory failure with hypoxia (HCC) 05/20/2019   Essential hypertension 05/20/2019   COVID-19 05/20/2019   Syncope and collapse 05/20/2019   Metabolic acidosis 05/16/2019   Intractable vomiting with nausea 02/23/2019   Cocaine abuse (HCC)    Colitis 11/11/2018   Hemorrhoidal skin tag    Folate deficiency 05/11/2017   Abdominal wall fistula    Cellulitis of abdominal wall 01/02/2017   Acute left hemiparesis (HCC) 12/05/2016   Malingering 12/05/2016   Weakness of left lower extremity 12/02/2016   CVA (cerebral vascular accident) (HCC) 11/23/2016   Bright red rectal bleeding 08/22/2016   Hypotension due to blood loss    Acute GI bleeding 05/24/2016   Dieulafoy lesion of duodenum    History of Billroth II operation    Gastrointestinal hemorrhage  05/22/2016   Absolute anemia    Acute pancreatitis 10/01/2015   Alcohol abuse with intoxication (HCC)    Left-sided weakness    Psychosomatic factor in physical condition    Upper GI bleed    Diverticulosis of colon with hemorrhage    Chronic wound infection of abdomen 12/22/2014   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   HTN (hypertension) 12/22/2014   Ataxia 11/01/2014   Hemorrhoids 04/26/2014   Lung nodule 04/26/2014   Diverticulosis    Gastritis    Hiatal hernia    Schatzki's ring    Acute blood loss anemia 04/25/2014   Hypokalemia 04/25/2014   Hematemesis with nausea 04/25/2014   Lung nodule < 6cm on CT 04/25/2014   Intractable nausea and vomiting 04/24/2014   Gastroparesis    Abdominal pain 04/01/2014   Gastroenteritis 12/10/2013   Chronic abdominal wound infection 08/16/2013   MDD (major depressive disorder), recurrent episode, severe (HCC) 06/27/2013   Wrist laceration 06/24/2013   Diarrhea 05/07/2013   Rectal bleeding 05/07/2013   Adrenal mass, left (HCC) 05/07/2013   Post-operative wound abscess 04/19/2013   Abdominal wall abscess 04/18/2013   Essential hypertension, benign 03/30/2013   History of cocaine abuse (HCC) 03/16/2013   History of schizoaffective disorder 03/16/2013   Bipolar disorder (HCC) 03/16/2013  Personality disorder (HCC) 03/16/2013   Tobacco use disorder 03/16/2013   Alcohol abuse 03/16/2013   Palpitations 03/16/2013   Poor vision 03/16/2013   History of gastric bypass 03/16/2013   Status post hysterectomy with oophorectomy 03/16/2013   Hypothyroid 03/16/2013   Lupus (HCC) 03/16/2013    Past Medical History:  Diagnosis Date   Abscess    soft tissue   Adrenal mass (HCC)    Alcohol abuse    Anemia    Anxiety    Blood transfusion without reported diagnosis    Chronic abdominal pain    Chronic wound infection of abdomen    Colon polyp    colonoscopy 04/2014   Depression    Discoid lupus erythematosus    Diverticulosis    colonoscopy 04/2014  moderat pan colonic   Drug-seeking behavior    Gastritis    EGD 05/2014   Gastroparesis 01/2014   GERD (gastroesophageal reflux disease)    Hemorrhoid    internal large   Hiatal hernia    History of Billroth II operation    Hypertension    Lung nodule    CT 02/2014 needs repeat 1 month   Lung nodule < 6cm on CT 04/25/2014   Lupus (HCC)    Malingering    Migraine    Nausea and vomiting    chronic, recurrent   Pancreatitis    Schatzki's ring    patent per EGD 04/2014   Sickle cell trait (HCC)    Stroke (HCC)    Suicide attempt (HCC)    Thyroid disease 2000   overactive, radiation    Family History  Problem Relation Age of Onset   Drug abuse Mother    Lung cancer Father    Cancer Father        mets   Drug abuse Sister    Drug abuse Brother    Breast cancer Maternal Aunt    Bipolar disorder Maternal Aunt    Drug abuse Maternal Aunt    Colon cancer Maternal Grandmother        late 19s, early 40s   Bipolar disorder Paternal Grandfather    Brain cancer Son    Schizophrenia Son    Cancer Son        brain   Bipolar disorder Cousin    Liver disease Neg Hx    Past Surgical History:  Procedure Laterality Date   ABDOMINAL HYSTERECTOMY  2013   [place]   ABDOMINAL HYSTERECTOMY     ABDOMINAL SURGERY     ADRENALECTOMY Right    AGILE CAPSULE N/A 01/05/2015   Procedure: AGILE CAPSULE;  Surgeon: Corbin Ade, MD;  [facility]: [facility];  Service: Endoscopy;  Laterality: N/A;  0700   BALLOON DILATION N/A 10/01/2021   Procedure: BALLOON DILATION;  Surgeon: Lanelle Bal, DO;  Location: [facility];  Service: Endoscopy;  Laterality: N/A;   Billroth II procedure      [place], first 2000, 2005/2006.   bilroth 2     BIOPSY  05/20/2013   Procedure: BIOPSIES OF ASCENDING AND SIGMOID COLON;  Surgeon: Corbin Ade, MD;  Location: [facility];  Service: Endoscopy;;   BIOPSY  04/26/2014   Procedure: BIOPSIES;  Surgeon: West Bali, MD;  Location: [facility];  Service:  Endoscopy;;   BIOPSY  12/24/2018   Procedure: BIOPSY;  Surgeon: Corbin Ade, MD;  [facility]: [facility];  Service: Endoscopy;;  colon   CHOLECYSTECTOMY     COLONOSCOPY  in [place]   COLONOSCOPY WITH PROPOFOL N/A 05/20/2013   Dr.Rourk- inadequate prep, normal appearing rectum, grossly normal colon aside from pancolonic diverticula, normal terminal ileum bx= unremarkable colonic mucosa. Due for early interval 2016.    COLONOSCOPY WITH PROPOFOL N/A 04/26/2014   OZH:YQMVHQ ileum/one colon polyp removed/moderate pan-colonic diverticulosis/large internal hemorrhoids   COLONOSCOPY WITH PROPOFOL N/A 12/23/2014   Dr.Rourk- minimal internal hemorrhoids, pancolonic diverticulosis   COLONOSCOPY WITH PROPOFOL N/A 08/23/2016   Procedure: COLONOSCOPY WITH PROPOFOL;  Surgeon: West Bali, MD;  [facility]: [facility];  Service: Endoscopy;  Laterality: N/A;   COLONOSCOPY WITH PROPOFOL N/A 12/24/2018   Pancolonic diverticulosis, normal TI, one 5 mm polyp in rectum (tubular adenoma). Somewhat friable hemorrhagic mucosa in area of IC valve/cecum, possibly related to scope trauma s/p biopsy. Repeat in 7 years.   DEBRIDEMENT OF ABDOMINAL WALL ABSCESS N/A 02/08/2013   Procedure: DEBRIDEMENT OF ABDOMINAL WALL ABSCESS;  Surgeon: Dalia Heading, MD;  Location: [facility];  Service: General;  Laterality: N/A;   ESOPHAGOGASTRODUODENOSCOPY (EGD) WITH PROPOFOL N/A 05/20/2013   Dr.Rourk- s/p prior gastric surgery with normal esophagus, residual gastric mucosa and patent efferent limb   ESOPHAGOGASTRODUODENOSCOPY (EGD) WITH PROPOFOL N/A 02/03/2014   Dr. Jena Gauss:  s/p hemigastrectomy with retained gastric contents. Residual gastric mucosa and efferent limb appeared normal otherwise. Query gastroparesis.    ESOPHAGOGASTRODUODENOSCOPY (EGD) WITH PROPOFOL N/A 04/26/2014   ION:[id number]'L ring/small HH/mild non-erosive gasrtitis/normal anastomosis   ESOPHAGOGASTRODUODENOSCOPY (EGD) WITH PROPOFOL N/A 12/23/2014    Dr.Rourk- s/p prior hemigastrctomy, active oozing from anastomotic suture site, hemostasis achieved   ESOPHAGOGASTRODUODENOSCOPY (EGD) WITH PROPOFOL N/A 05/23/2016   Dr. 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    ESOPHAGOGASTRODUODENOSCOPY (EGD) WITH PROPOFOL N/A 05/11/2017   Procedure: ESOPHAGOGASTRODUODENOSCOPY (EGD) WITH PROPOFOL;  Surgeon: Corbin Ade, MD;  [facility];  Service: Endoscopy;  Laterality: N/A;   ESOPHAGOGASTRODUODENOSCOPY (EGD) WITH PROPOFOL N/A 06/07/2021   Procedure: ESOPHAGOGASTRODUODENOSCOPY (EGD) WITH PROPOFOL;  Surgeon: Corbin Ade, MD;  [facility];  Service: Endoscopy;  Laterality: N/A;  11:45am   ESOPHAGOGASTRODUODENOSCOPY (EGD) WITH PROPOFOL N/A 10/01/2021   Procedure: ESOPHAGOGASTRODUODENOSCOPY (EGD) WITH PROPOFOL;  Surgeon: Lanelle Bal, DO;  [facility];  Service: Endoscopy;  Laterality: N/A;  3:00pm   EXCISION OF MESH  08/2019   HEMORRHOID SURGERY N/A 06/18/2017   Procedure: THREE COLUMN EXTENSIVE HEMORRHOIDECTOMY;  Surgeon: Lucretia Roers, MD;  Location: [facility];  Service: General;  Laterality: N/A;   HERNIA REPAIR     INCISION AND DRAINAGE ABSCESS N/A 01/06/2017   Procedure: INCISION AND DRAINAGE ABDOMINAL WALL ABSCESS;  Surgeon: Franky Macho, MD;  Location: [facility];  Service: General;  Laterality: N/A;   incisional hernial repair  09/05/2021   [place]   LEFT HEART CATH AND CORONARY ANGIOGRAPHY N/A 05/09/2020   Procedure: LEFT HEART CATH AND CORONARY ANGIOGRAPHY;  Surgeon: Elder Negus, MD;  [facility]: [facility];  Service: Cardiovascular;  Laterality: N/A;   POLYPECTOMY  12/24/2018   Procedure: POLYPECTOMY;  Surgeon: Corbin Ade, MD;  [facility];  Service: Endoscopy;;  colon   tendon repar Right    wrist   WOUND EXPLORATION Right 06/24/2013   Procedure: exploration of traumatic  wound right wrist;  Surgeon: Tami Ribas, MD;  [facility];  Service: Orthopedics;  Laterality: Right;   Social History   Social History Narrative   Caffeine- tea  occass   Immunization History  Administered Date(s) Administered   Influenza,inj,Quad PF,6+ Mos 02/08/2013, 04/02/2014, 12/23/2014, 12/03/2016   Influenza-Unspecified 02/08/2013, 04/02/2014, 12/23/2014, 12/03/2016   Pneumococcal Polysaccharide-23 06/25/2013, 12/03/2016, 11/12/2018   Tdap 03/16/2013, 06/24/2013     Objective: Vital Signs: BP (!) 146/88 (BP Location: Left Arm, Patient Position: Sitting, Cuff Size: Normal)   Pulse 78   Resp 14   Ht 5\' 3"  (1.6 m)   Wt 153 lb (69.4 kg)   LMP  (LMP Unknown)   BMI 27.10 kg/m    Physical Exam HENT:     Mouth/Throat:     Mouth: Mucous membranes are moist.     Pharynx: Oropharynx is clear.  Eyes:     Conjunctiva/sclera: Conjunctivae normal.  Cardiovascular:     Rate and Rhythm: Normal rate and regular rhythm.  Pulmonary:     Effort: Pulmonary effort is normal.     Breath sounds: Normal breath sounds.  Lymphadenopathy:     Cervical: No cervical adenopathy.  Skin:    General: Skin is warm and dry.     Findings: No rash.  Neurological:     Mental Status: She is alert.  Psychiatric:        Mood and Affect: Mood normal.      Musculoskeletal Exam:  Shoulders full ROM no tenderness or swelling Elbows full ROM no tenderness or swelling Left wrist tenderness to pressure and trace swelling, right 1st MCP and 5th PIP tenderness, left 1st-3rd MCPs tenderness, trace swelling, unable to fully close grip Knees full ROM left knee tenderness to pressure anterior and lateral joint line, mild swelling effusion vs bursitis   CDAI Exam: CDAI Score: 15  Patient Global: 50 / 100; Provider Global: 30 / 100 Swollen: 1 ; Tender: 6  Joint Exam 10/31/2022      Right  Left  Wrist     Swollen Tender  MCP 1   Tender   Tender  MCP 2      Tender  MCP 3      Tender  PIP 5  (finger)   Tender        Investigation: No additional findings.  Imaging: No results found.  Recent Labs: Lab Results  Component Value Date   WBC 6.0 05/16/2022   HGB 11.6 (L) 05/16/2022   PLT 325 05/16/2022   NA 137 05/16/2022   K 3.7 05/16/2022   CL 105 05/16/2022   CO2 25 05/16/2022   GLUCOSE 105 (H) 05/16/2022   BUN 5 (L) 05/16/2022   CREATININE 0.48 05/16/2022   BILITOT 0.7 05/16/2022   ALKPHOS 88 05/16/2022   AST 20 05/16/2022   ALT 22 05/16/2022   PROT 7.2 05/16/2022   ALBUMIN 3.7 05/16/2022   CALCIUM 8.7 (L) 05/16/2022   GFRAA 119 05/05/2020    Speciality Comments: PLQ Eye Exam 08/10/2021 [facility] f/u 1 month  Procedures:  No procedures performed Allergies: Metoclopramide, Codeine, and Reglan [metoclopramide]   Assessment / Plan:     Visit Diagnoses: Seropositive rheumatoid arthritis (HCC) - Plan: predniSONE (DELTASONE) 5 MG tablet, Sedimentation rate, C-reactive protein  Current joint inflammation looks consistent with RA disease activity.  Unsure about left-sided predominant symptoms could be affected by previous stroke history.  Checking sed rate and CRP.  Continue hydroxychloroquine 300 mg daily.  Prescribed prednisone taper for next 12 days.  If symptoms become more refractory could consider addition of a second maintenance drug may be methotrexate.  High risk medication use - hydroxychloroquine 300 mg  p.o. daily. PLQ Eye Exam 08/10/2021 - Plan: CBC with Differential/Platelet, COMPLETE METABOLIC PANEL WITH GFR  Checking CBC and CMP for medication monitoring continue long-term use of hydroxychloroquine and with possible need to start alternative DMARD for breakthrough symptoms.  I do not see a more recent hydroxychloroquine eye exam on record would be due since May of this year.  No serious interval infections.  Lupus (HCC) - Plan: hydroxychloroquine (PLAQUENIL) 200 MG tablet  History of lupus only symptom here has consistently been  inflammatory arthritis. No current signs or symptoms suggestive for lupus or extra-articular disease activity.    Orders: Orders Placed This Encounter  Procedures   Sedimentation rate   C-reactive protein   CBC with Differential/Platelet   COMPLETE METABOLIC PANEL WITH GFR   Meds ordered this encounter  Medications   predniSONE (DELTASONE) 5 MG tablet    Sig: Take 4 tablets (20 mg total) by mouth daily with breakfast for 3 days, THEN 3 tablets (15 mg total) daily with breakfast for 3 days, THEN 2 tablets (10 mg total) daily with breakfast for 3 days, THEN 1 tablet (5 mg total) daily with breakfast for 3 days.    Dispense:  30 tablet    Refill:  0   hydroxychloroquine (PLAQUENIL) 200 MG tablet    Sig: Take 1.5 tablets (300 mg total) by mouth daily.    Dispense:  135 tablet    Refill:  1     Follow-Up Instructions: Return in about 6 months (around 05/03/2023) for RA on HCQ f/u 6mos.   Fuller Plan, MD  Note - This record has been created using [employer].  Chart creation errors have been sought, but may not always  have been located. Such creation errors do not reflect on  the standard of medical care.

## 2022-10-31 ENCOUNTER — Encounter: Payer: Self-pay | Admitting: Internal Medicine

## 2022-10-31 ENCOUNTER — Ambulatory Visit: Payer: Medicare Other | Attending: Internal Medicine | Admitting: Internal Medicine

## 2022-10-31 VITALS — BP 142/89 | HR 77 | Resp 14 | Ht 63.0 in | Wt 153.0 lb

## 2022-10-31 DIAGNOSIS — Z79899 Other long term (current) drug therapy: Secondary | ICD-10-CM

## 2022-10-31 DIAGNOSIS — I10 Essential (primary) hypertension: Secondary | ICD-10-CM | POA: Diagnosis not present

## 2022-10-31 DIAGNOSIS — M059 Rheumatoid arthritis with rheumatoid factor, unspecified: Secondary | ICD-10-CM

## 2022-10-31 DIAGNOSIS — M329 Systemic lupus erythematosus, unspecified: Secondary | ICD-10-CM | POA: Diagnosis not present

## 2022-10-31 MED ORDER — PREDNISONE 5 MG PO TABS: ORAL_TABLET | ORAL | 0 refills | Status: AC

## 2022-10-31 MED ORDER — HYDROXYCHLOROQUINE SULFATE 200 MG PO TABS: 300.0000 mg | ORAL_TABLET | Freq: Every day | ORAL | 1 refills | Status: DC

## 2022-11-01 LAB — COMPLETE METABOLIC PANEL WITH GFR
AG Ratio: 1.3 (calc) (ref 1.0–2.5)
ALT: 17 U/L (ref 6–29)
AST: 16 U/L (ref 10–35)
Albumin: 3.5 g/dL — ABNORMAL LOW (ref 3.6–5.1)
Alkaline phosphatase (APISO): 68 U/L (ref 37–153)
BUN/Creatinine Ratio: 18 (calc) (ref 6–22)
BUN: 8 mg/dL (ref 7–25)
CO2: 27 mmol/L (ref 20–32)
Calcium: 8.8 mg/dL (ref 8.6–10.4)
Chloride: 107 mmol/L (ref 98–110)
Creat: 0.44 mg/dL — ABNORMAL LOW (ref 0.50–1.03)
Globulin: 2.8 g/dL (ref 1.9–3.7)
Glucose, Bld: 88 mg/dL (ref 65–99)
Potassium: 3.8 mmol/L (ref 3.5–5.3)
Sodium: 140 mmol/L (ref 135–146)
Total Bilirubin: 0.4 mg/dL (ref 0.2–1.2)
Total Protein: 6.3 g/dL (ref 6.1–8.1)
eGFR: 114 mL/min/{1.73_m2} (ref >=60)

## 2022-11-01 LAB — CBC WITH DIFFERENTIAL/PLATELET
Absolute Monocytes: 593 {cells}/uL (ref 200–950)
Basophils Absolute: 23 {cells}/uL (ref 0–200)
Basophils Relative: 0.4 %
Eosinophils Absolute: 68 {cells}/uL (ref 15–500)
Eosinophils Relative: 1.2 %
HCT: 32.6 % — ABNORMAL LOW (ref 35.0–45.0)
Hemoglobin: 10.7 g/dL — ABNORMAL LOW (ref 11.7–15.5)
Lymphs Abs: 2770 {cells}/uL (ref 850–3900)
MCH: 27 pg (ref 27.0–33.0)
MCHC: 32.8 g/dL (ref 32.0–36.0)
MCV: 82.1 fL (ref 80.0–100.0)
MPV: 9.6 fL (ref 7.5–12.5)
Monocytes Relative: 10.4 %
Neutro Abs: 2246 {cells}/uL (ref 1500–7800)
Neutrophils Relative %: 39.4 %
Platelets: 343 10*3/uL (ref 140–400)
RBC: 3.97 10*6/uL (ref 3.80–5.10)
RDW: 19.4 % — ABNORMAL HIGH (ref 11.0–15.0)
Total Lymphocyte: 48.6 %
WBC: 5.7 10*3/uL (ref 3.8–10.8)

## 2022-11-01 LAB — SEDIMENTATION RATE: Sed Rate: 28 mm/h (ref 0–30)

## 2022-11-01 LAB — C-REACTIVE PROTEIN: CRP: 11.4 mg/L — ABNORMAL HIGH (ref <8.0)

## 2022-11-01 NOTE — Progress Notes (Signed)
CRP is increased at 11.4. Sed rate is higher than before at 28 although still in the normal range. Hemoglobin of 10.7 is mild anemia. Metabolic panel is normal. This looks consistent with a flare up of rheumatoid arthritis. The prednisone taper should improve symptoms if it does not or symptoms come right back she can let us know.

## 2022-11-18 IMAGING — RF DG FLUORO GUIDE SPINAL/SI JT INJ*L*
2 series · 2 of 2 positions shown · non-contrast
Comparison: CT head 07/13/2021

CLINICAL DATA: Severe headache for 3 days questions of RIGHT or
hemorrhage

EXAM:
DIAGNOSTIC LUMBAR PUNCTURE UNDER FLUOROSCOPIC GUIDANCE

[Series 1: cp_standard · 0.18mm/px · 1 of 1 slices shown (1 of 2)]
[im 1/1]
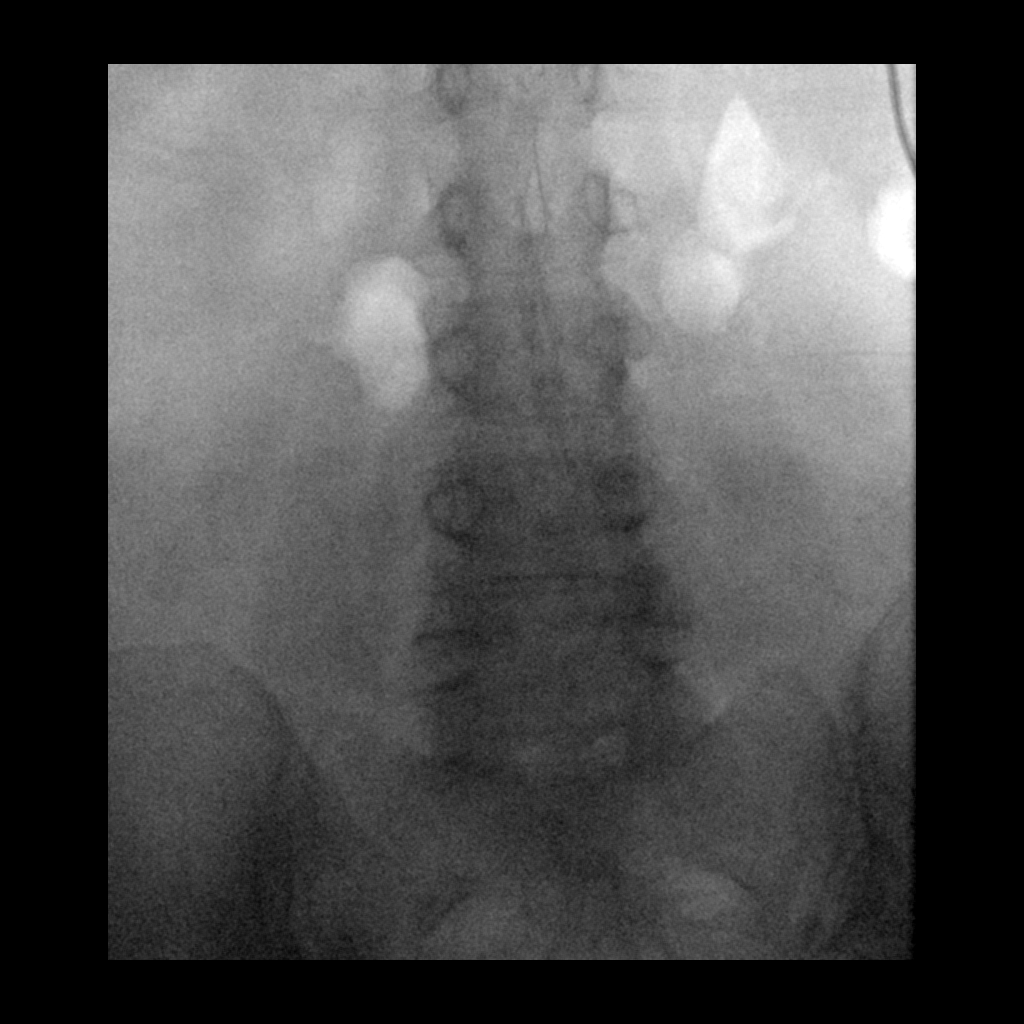

[Series 2: cp_standard · 0.18mm/px · 1 of 1 slices shown (2 of 2)]
[im 1/1]
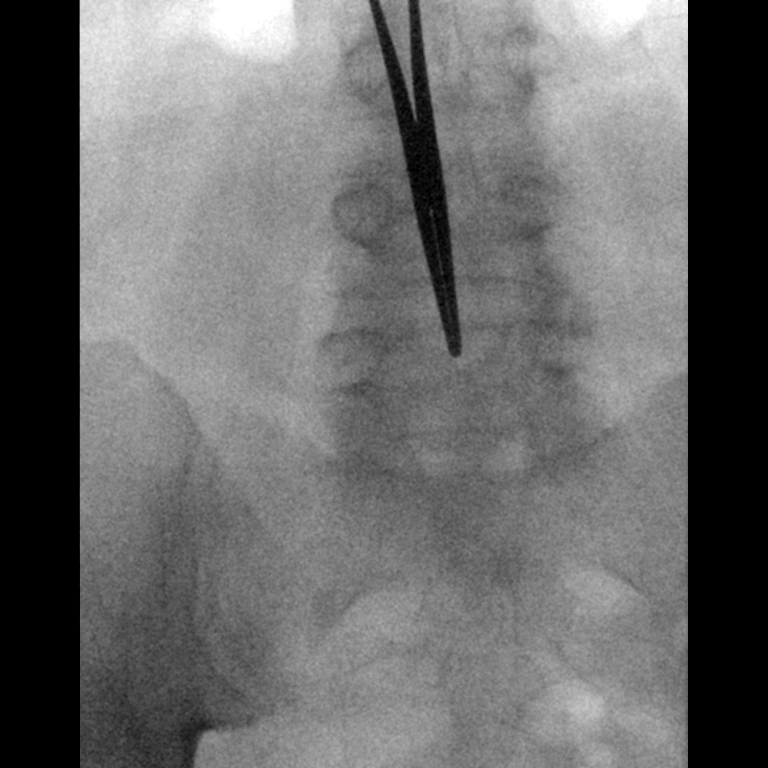

[2 of 2 positions shown; findings below may reference images not displayed]

FLUOROSCOPY:
Radiation Exposure Index (as provided by the fluoroscopic device):
10.6 mGy Kerma

PROCEDURE:
Procedure, benefits, and risks were discussed with the patient,
including alternatives.

Patient's questions were answered.

Written informed consent was obtained.

Timeout protocol followed.

Patient placed prone.

L4-L5 disc space was localized under fluoroscopy.

Skin prepped and draped in usual sterile fashion.

Skin and soft tissues anesthetized with 3 mL of 1% lidocaine.

22 gauge needle was advanced into the spinal canal where clear
colorless CSF was encountered with an opening pressure of 7 H2O
(prone).

9 mL of CSF was obtained in 4 tubes for requested analysis.

Procedure tolerated very well by patient without immediate
complication.
IMPRESSION: Fluoroscopic guided lumbar puncture as above.

## 2022-11-18 IMAGING — MR MR MRA HEAD W/O CM
1 series · 48 of 48 positions shown · IV contrast (gadavist)
Comparison: MRI 2021

CLINICAL DATA: Neuro deficit, acute, stroke suspected left sided
weakness

EXAM:
MRI HEAD WITHOUT AND WITH CONTRAST
MRA HEAD WITHOUT CONTRAST
TECHNIQUE: Multiplanar, multi-echo pulse sequences of the brain and surrounding
structures were acquired without and with intravenous contrast.
Angiographic images of the Circle of Willis were acquired using MRA
technique without intravenous contrast.
CONTRAST:  7mL GADAVIST GADOBUTROL 1 MMOL/ML IV SOLN

[Series 1: TOF · axial · 0.5mm · 0.76mm/px · z∈[-117,-40]mm · 48 of 168 slices shown]
[im 1/168]
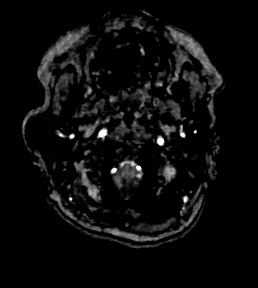
[im 4/168]
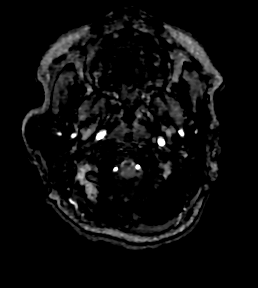
[im 8/168]
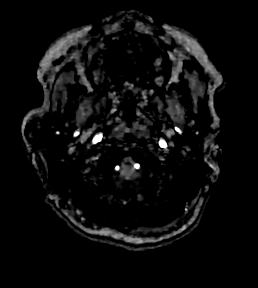
[im 11/168]
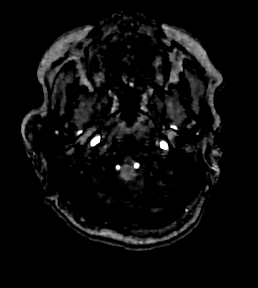
[im 15/168]
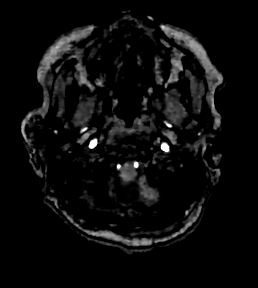
[im 18/168]
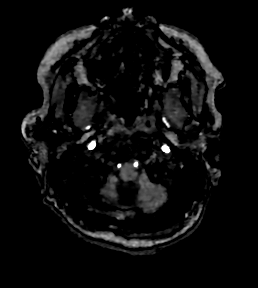
[im 22/168]
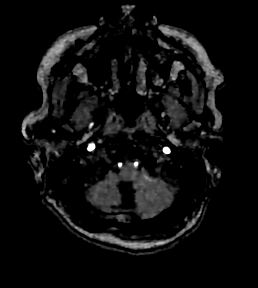
[im 25/168]
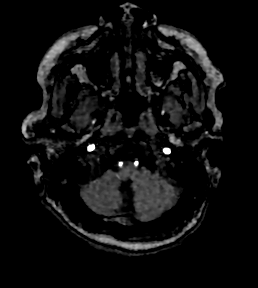
[im 29/168]
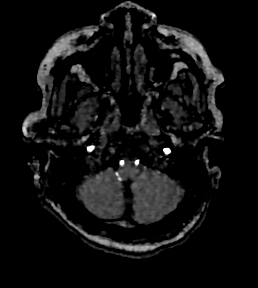
[im 32/168]
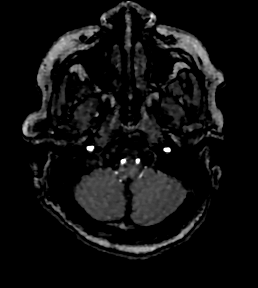
[im 36/168]
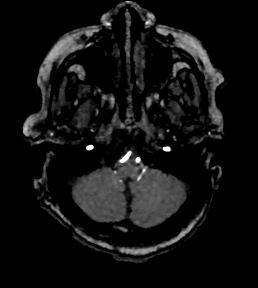
[im 40/168]
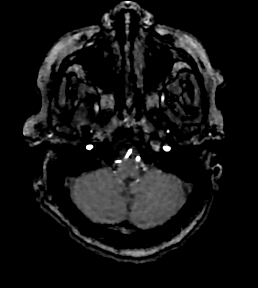
[im 43/168]
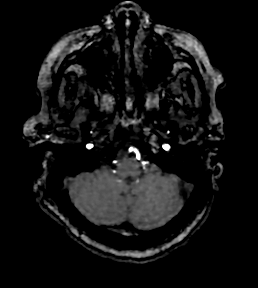
[im 47/168]
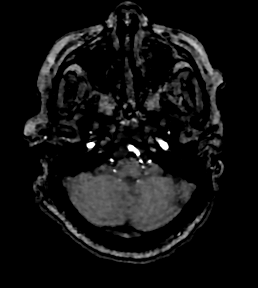
[im 50/168]
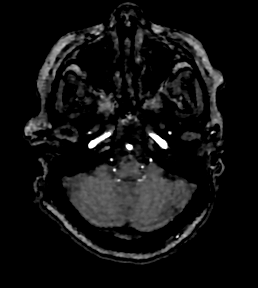
[im 54/168]
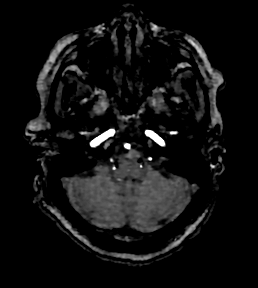
[im 57/168]
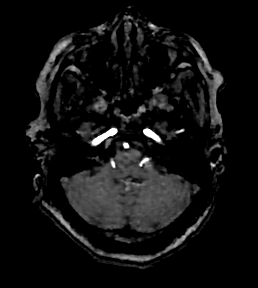
[im 61/168]
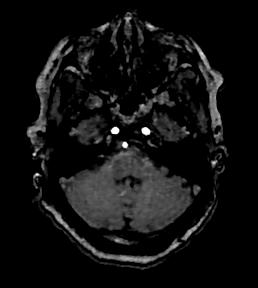
[im 64/168]
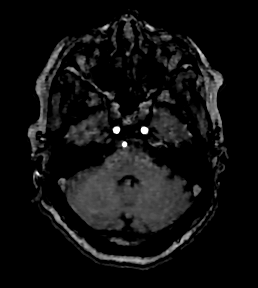
[im 68/168]
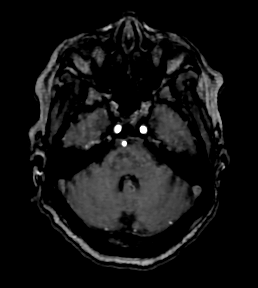
[im 72/168]
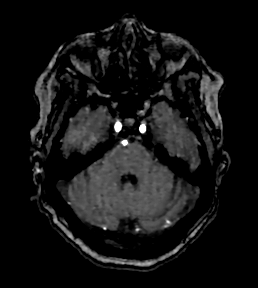
[im 75/168]
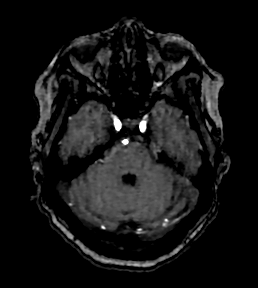
[im 79/168]
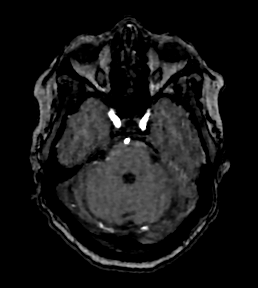
[im 82/168]
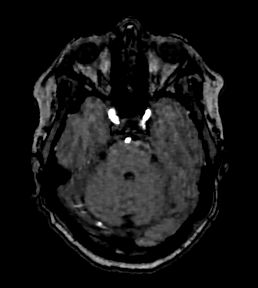
[im 86/168]
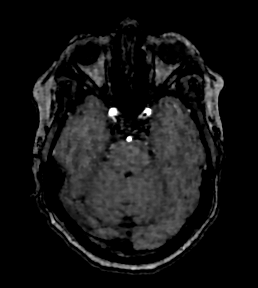
[im 89/168]
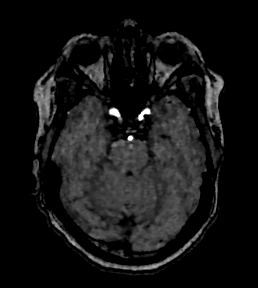
[im 93/168]
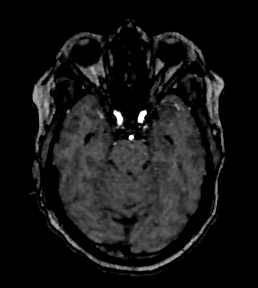
[im 96/168]
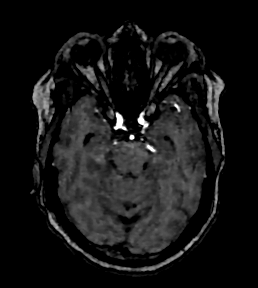
[im 100/168]
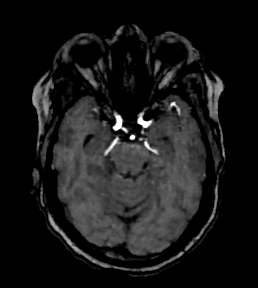
[im 104/168]
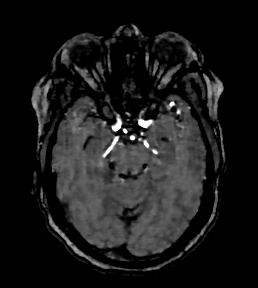
[im 107/168]
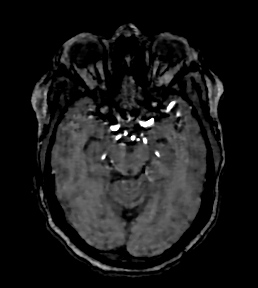
[im 111/168]
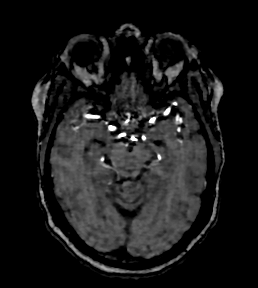
[im 114/168]
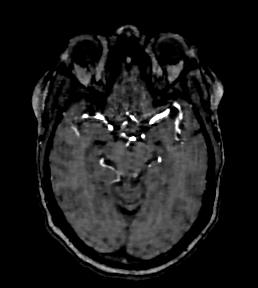
[im 118/168]
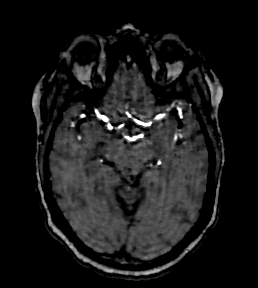
[im 121/168]
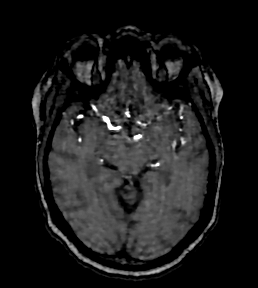
[im 125/168]
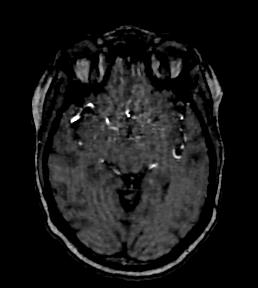
[im 128/168]
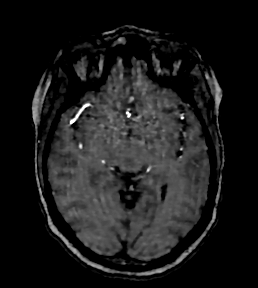
[im 132/168]
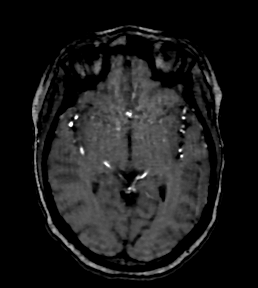
[im 136/168]
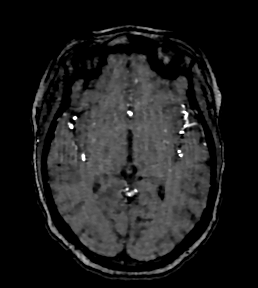
[im 139/168]
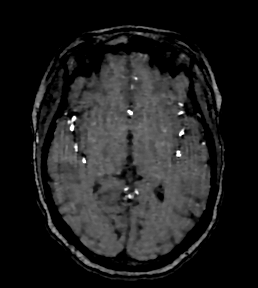
[im 143/168]
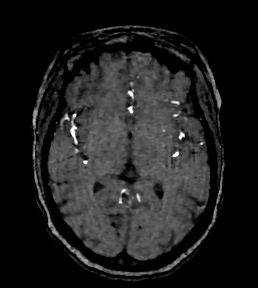
[im 146/168]
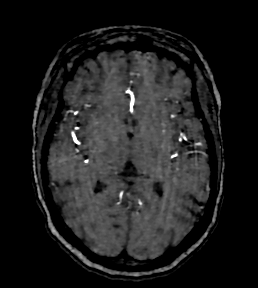
[im 150/168]
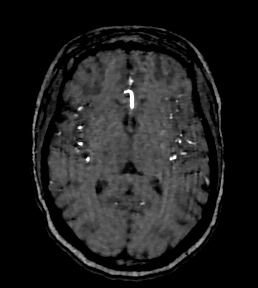
[im 153/168]
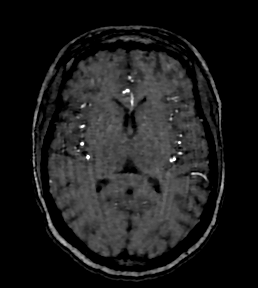
[im 157/168]
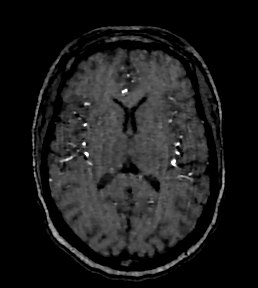
[im 160/168]
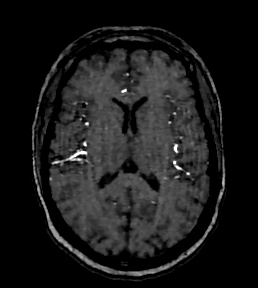
[im 164/168]
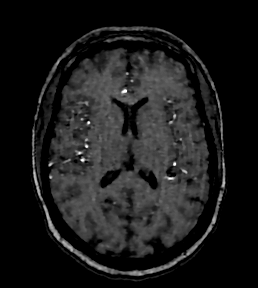
[im 168/168]
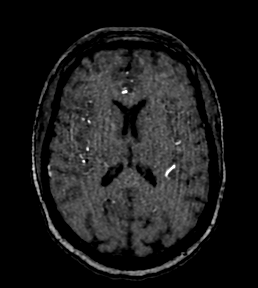

[48 of 48 positions shown; findings below may reference images not displayed]

FINDINGS: MRI HEAD FINDINGS

Brain: No acute infarction or hemorrhage. No intracranial mass, mass
effect, edema, hydrocephalus, or extra-axial collection. Ventricles
and sulci are normal in size and configuration.

Vascular: Major vessel flow voids at the skull base are preserved.

Skull and upper cervical spine: Marrow signal is within normal
limits.

Sinuses/Orbits: Minor mucosal thickening.  Orbits are unremarkable.

Other: Partially empty sella.  Mastoid air cells are clear.

MRA HEAD FINDINGS

Motion artifact is present.

Anterior circulation: Intracranial internal carotid arteries are
patent. Anterior and middle cerebral arteries are patent.

Posterior circulation: Intracranial vertebral arteries patent.
Basilar artery is patent. Posterior cerebral arteries are patent.
Bilateral posterior communicating arteries are present.
IMPRESSION: No acute infarction, hemorrhage, or mass.

Vascular imaging is degraded by motion but without significant
abnormality.

## 2022-11-18 IMAGING — CT CT HEAD CODE STROKE
3 of 4 series · 15 of 47 positions shown, 18 images · non-contrast
Comparison: MRI of the brain November 10, 2019.

CLINICAL DATA: Code stroke.  Left-sided pain.



[Series 3: head w o · axial · 0.44mm/px · z∈[+54,+194]mm · 9 of 34 slices shown, 12 images]
[im 3/34  brain]
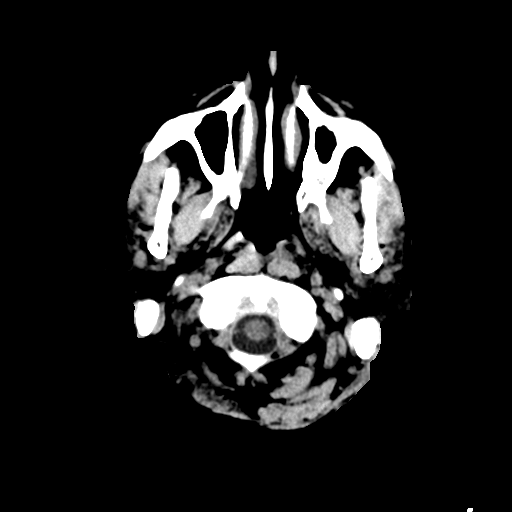
[im 3/34  bone]
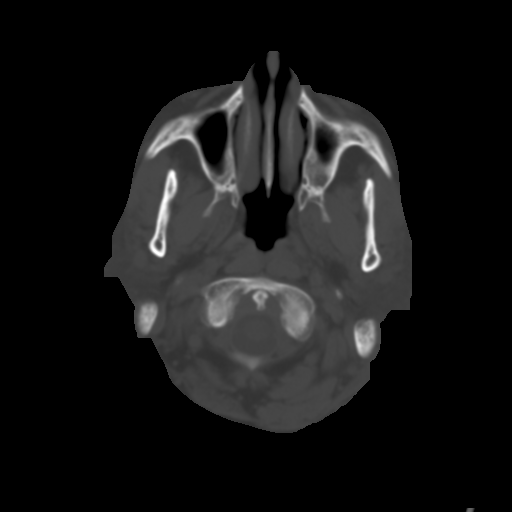
[im 8/34  brain]
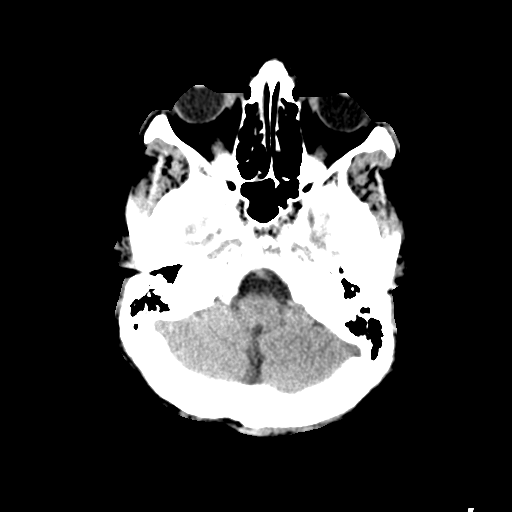
[im 10/34  brain]
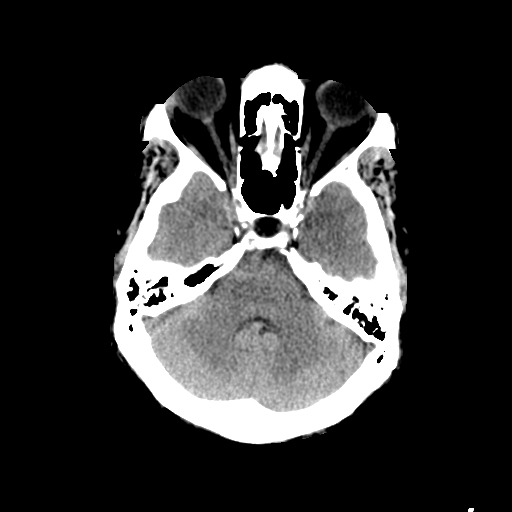
[im 15/34  brain]
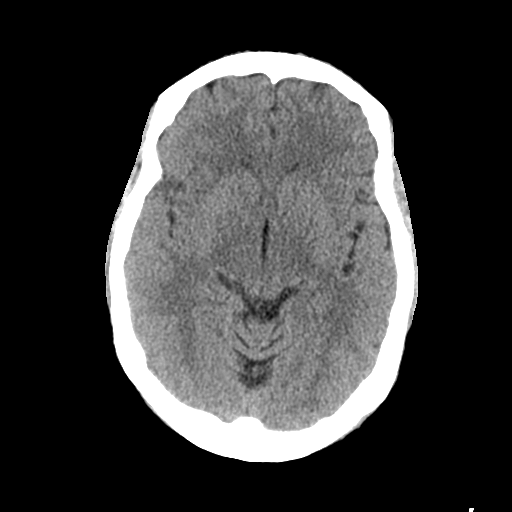
[im 17/34  brain]
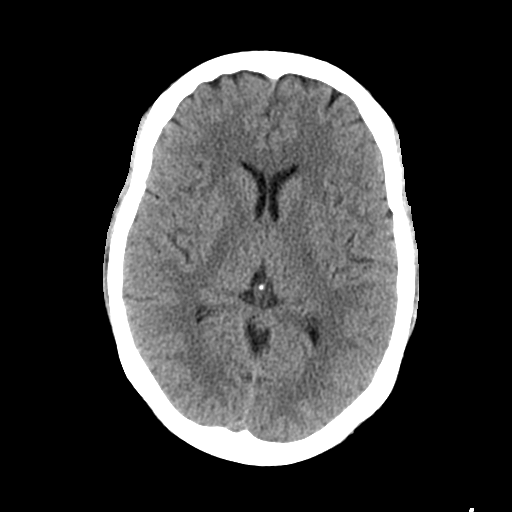
[im 17/34  bone]
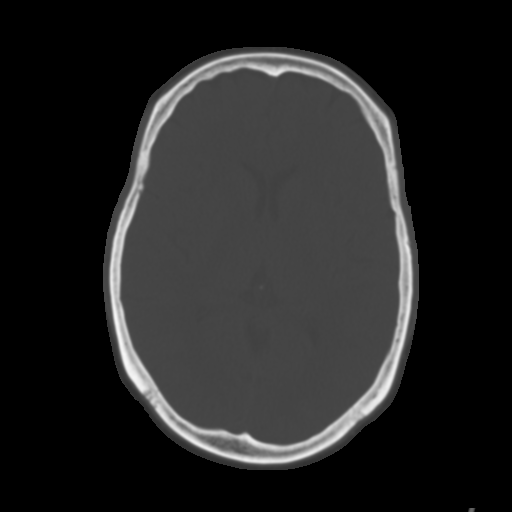
[im 19/34  brain]
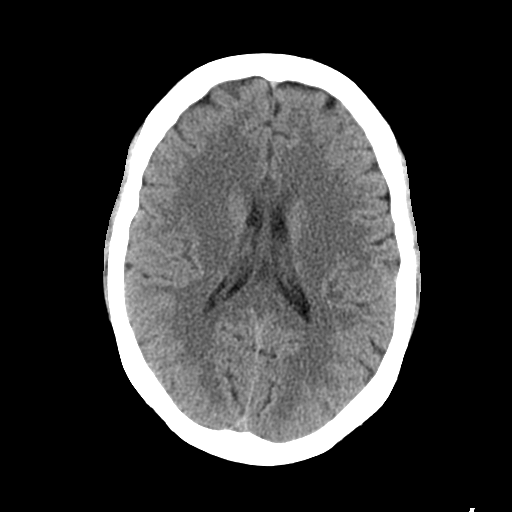
[im 24/34  brain]
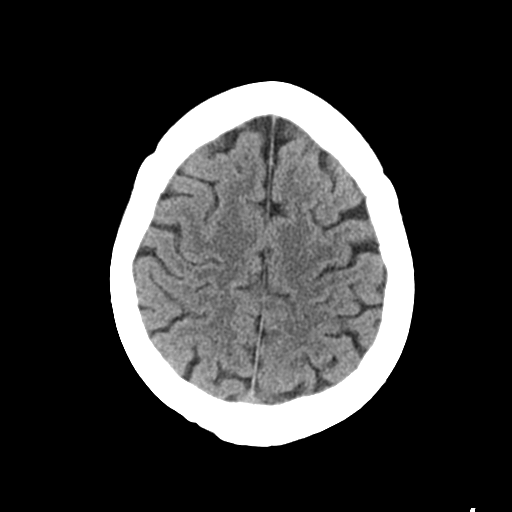
[im 26/34  brain]
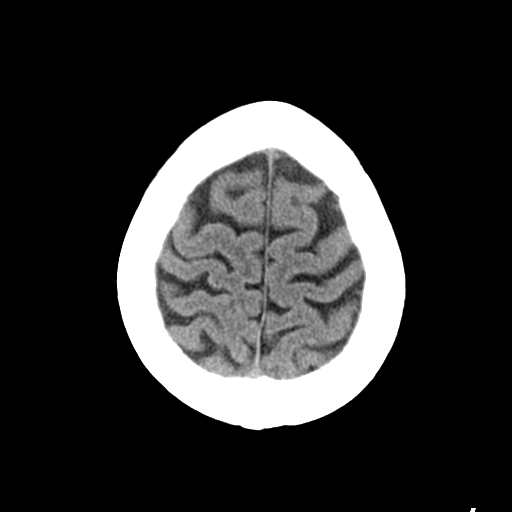
[im 31/34  brain]
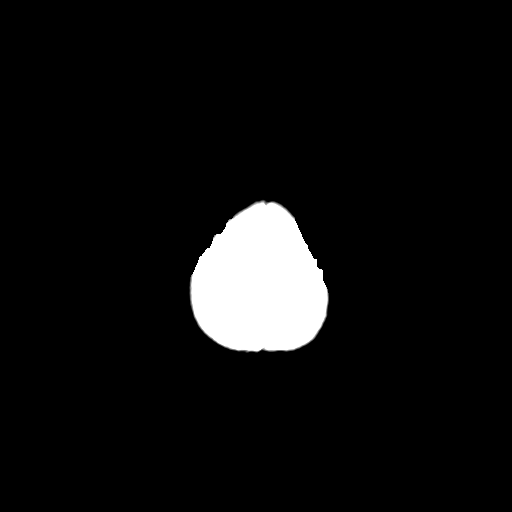
[im 31/34  bone]
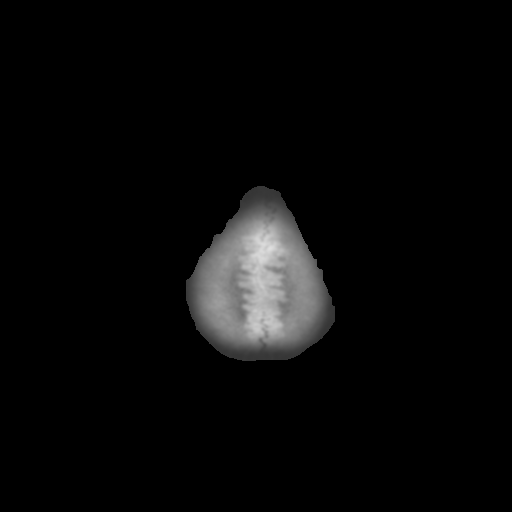

[Series 5: coronal soft · coronal · 0.33mm/px · 3 of 73 slices shown]
[im 25/73  brain]
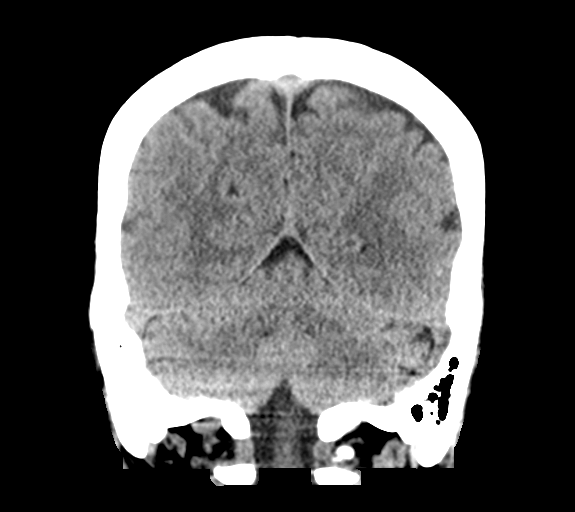
[im 33/73  brain]
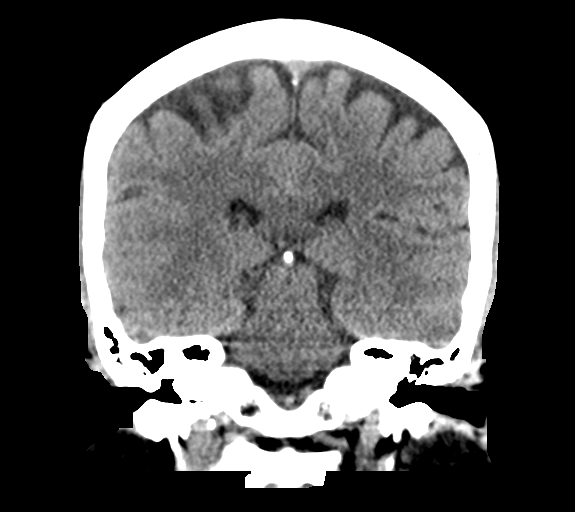
[im 41/73  brain]
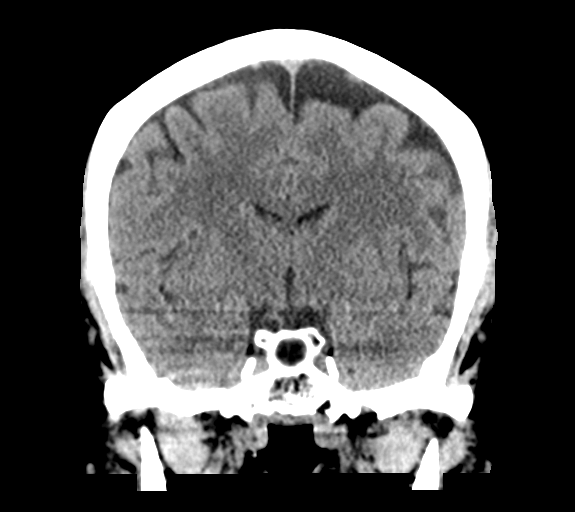

[Series 6: sagittal soft · sagittal · 0.34mm/px · 3 of 60 slices shown]
[im 20/60  brain]
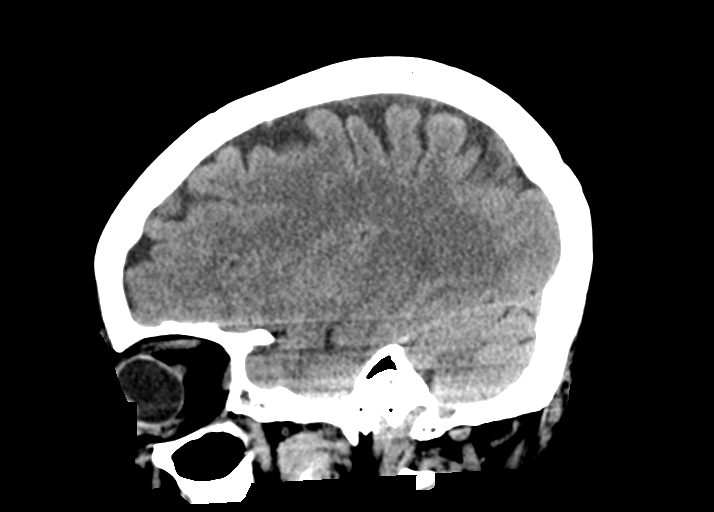
[im 30/60  brain]
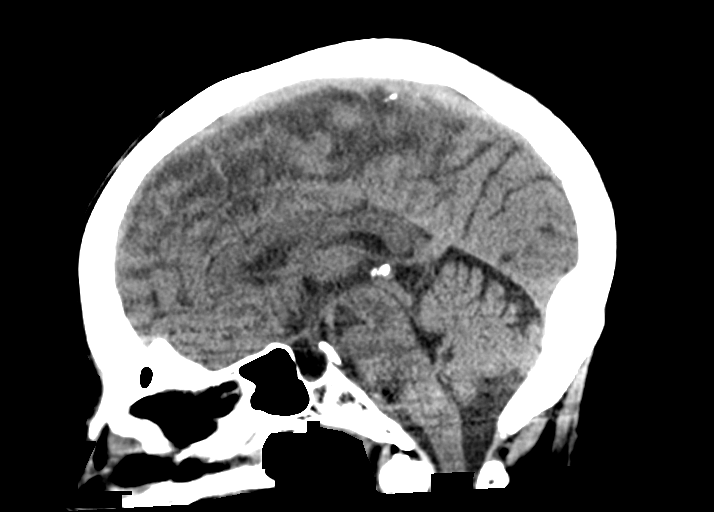
[im 40/60  brain]
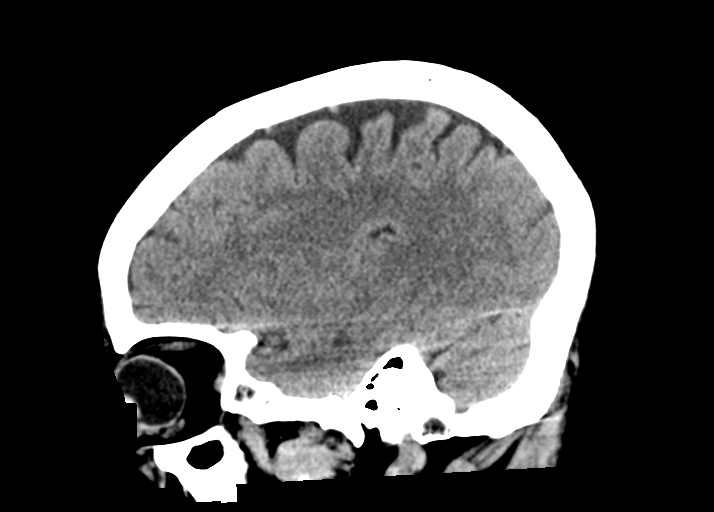

[15 of 47 positions shown; findings below may reference images not displayed]

FINDINGS: Brain: No evidence of acute infarction, hemorrhage, hydrocephalus,
extra-axial collection or mass lesion/mass effect.

Vascular: No hyperdense vessel or unexpected calcification.

Skull: Normal. Negative for fracture or focal lesion.

Sinuses/Orbits: No acute finding.

Other: None.

ASPECTS ([place] Stroke Program Early CT Score)

- Ganglionic level infarction (caudate, lentiform nuclei, internal
capsule, insula, M1-M3 cortex): 7

- Supraganglionic infarction (M4-M6 cortex): 3

Total score (0-10 with 10 being normal): 10
IMPRESSION: 1. No acute intracranial abnormality.
2. ASPECTS is 10.

These results were called by telephone at the time of interpretation
on 07/13/2021 at [DATE] to provider LAAOUINA TIGER , who verbally
acknowledged these results.

## 2022-11-18 IMAGING — MR MR HEAD WO/W CM
12 of 14 series · 38 of 48 positions shown · IV contrast (gadavist)
Comparison: MRI 2021

CLINICAL DATA: Neuro deficit, acute, stroke suspected left sided
weakness

EXAM:
MRI HEAD WITHOUT AND WITH CONTRAST
MRA HEAD WITHOUT CONTRAST
TECHNIQUE: Multiplanar, multi-echo pulse sequences of the brain and surrounding
structures were acquired without and with intravenous contrast.
Angiographic images of the Circle of Willis were acquired using MRA
technique without intravenous contrast.
CONTRAST:  7mL GADAVIST GADOBUTROL 1 MMOL/ML IV SOLN

[Series 5: DWI · axial · 4.0mm · 0.88mm/px · z∈[-117,+21]mm · 5 of 36 slices shown (1 of 6)]
[im 1/36]
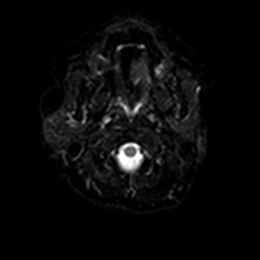
[im 9/36]
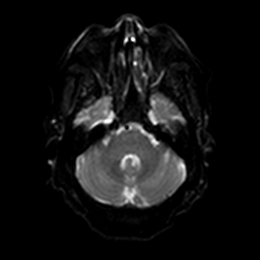
[im 18/36]
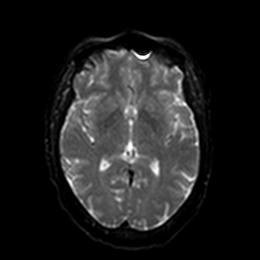
[im 27/36]
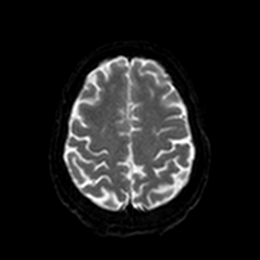
[im 36/36]
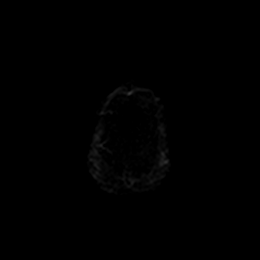

[Series 5: DWI · axial · 4.0mm · 0.88mm/px · z∈[-117,+21]mm · 4 of 36 slices shown (2 of 6)]
[im 1/36]
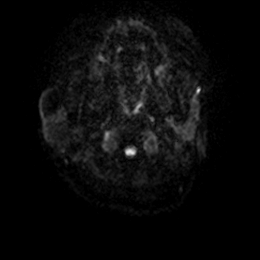
[im 12/36]
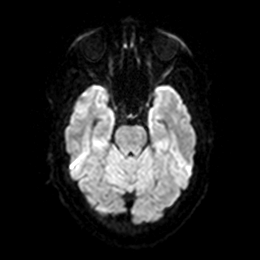
[im 24/36]
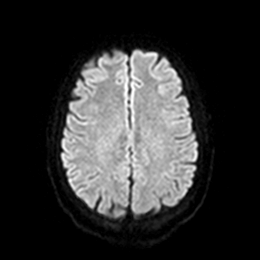
[im 36/36]
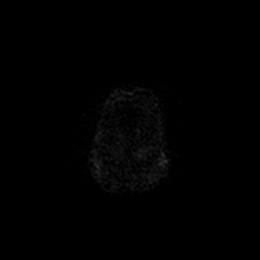

[Series 6: DWI · axial · 4.0mm · 0.88mm/px · z∈[-117,+21]mm · 4 of 36 slices shown (3 of 6)]
[im 1/36]
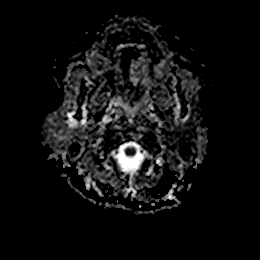
[im 12/36]
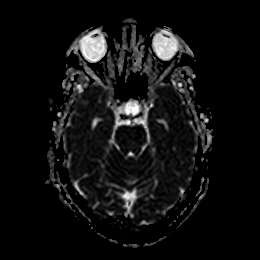
[im 24/36]
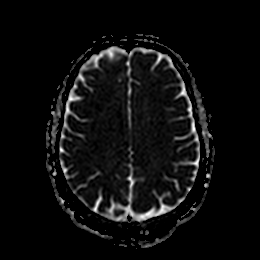
[im 36/36]
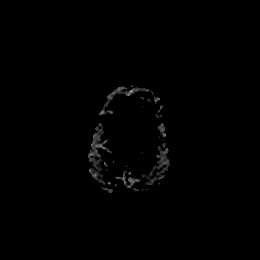

[Series 7: DWI · coronal · 4.0mm · 0.88mm/px · 3 of 32 slices shown (4 of 6)]
[im 1/32]
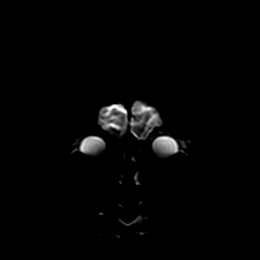
[im 16/32]
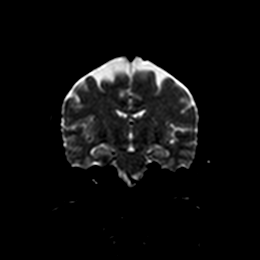
[im 32/32]
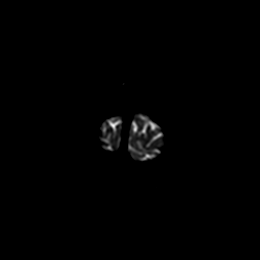

[Series 7: DWI · coronal · 4.0mm · 0.88mm/px · 3 of 32 slices shown (5 of 6)]
[im 1/32]
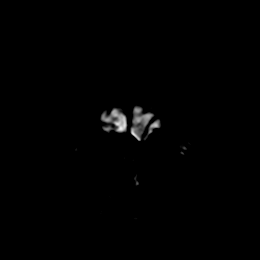
[im 16/32]
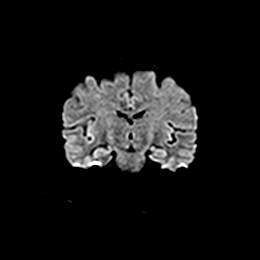
[im 32/32]
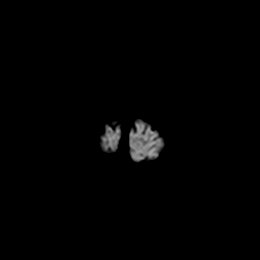

[Series 8: DWI · coronal · 4.0mm · 0.88mm/px · 3 of 32 slices shown (6 of 6)]
[im 1/32]
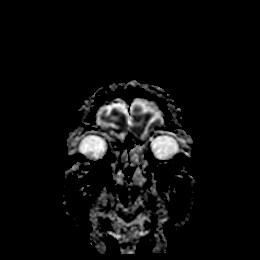
[im 16/32]
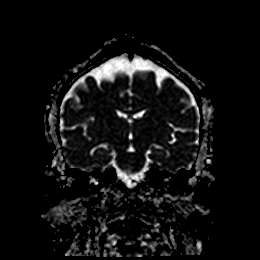
[im 32/32]
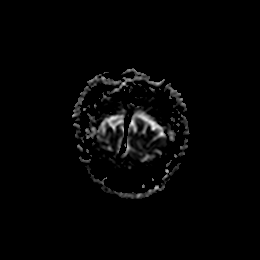

[Series 9: T1 · sagittal · 5.0mm · 0.80mm/px · 2 of 21 slices shown]
[im 1/21]
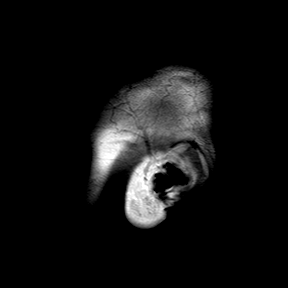
[im 21/21]
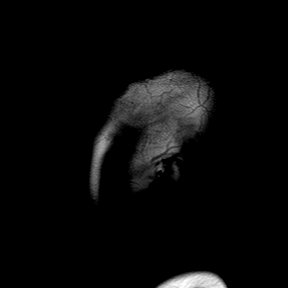

[Series 10: T2 · axial · 5.0mm · 0.72mm/px · z∈[-124,+28]mm · 2 of 23 slices shown (1 of 2)]
[im 1/23]
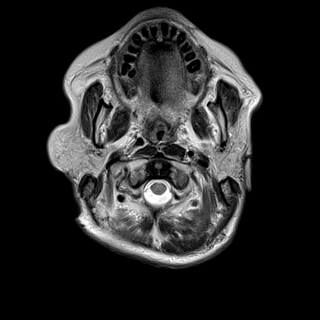
[im 23/23]
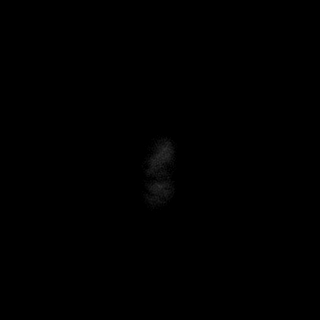

[Series 11: ax hemo · axial · 5.0mm · 0.86mm/px · z∈[-118,+24]mm · 3 of 25 slices shown]
[im 1/25]
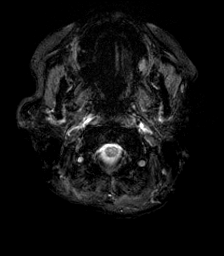
[im 13/25]
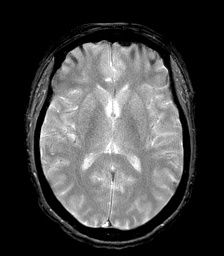
[im 25/25]
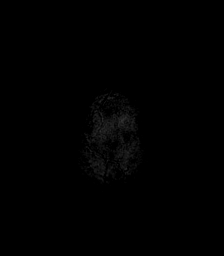

[Series 12: FLAIR · axial · 4.0mm · 0.43mm/px · z∈[-120,+26]mm · 4 of 38 slices shown]
[im 1/38]
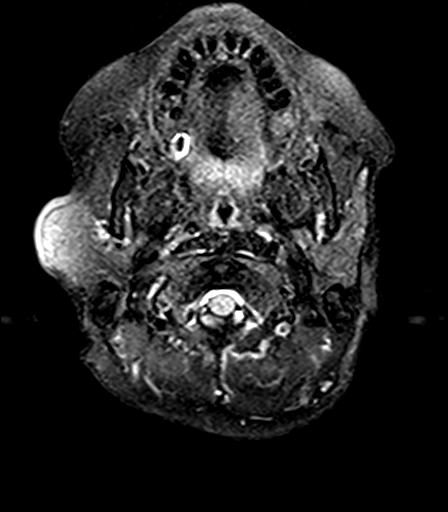
[im 13/38]
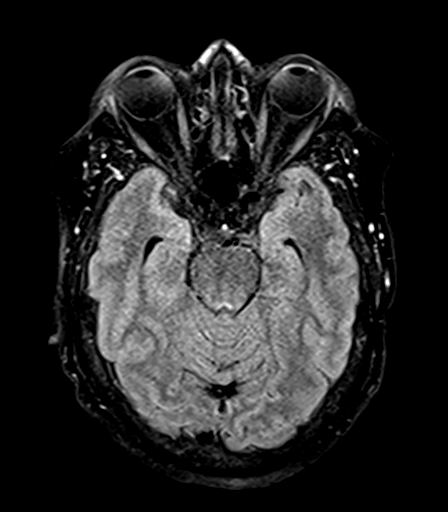
[im 25/38]
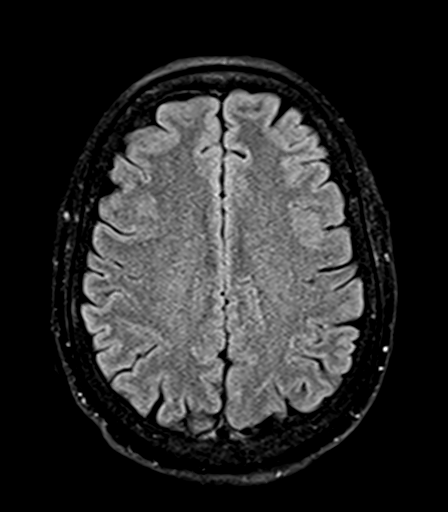
[im 38/38]
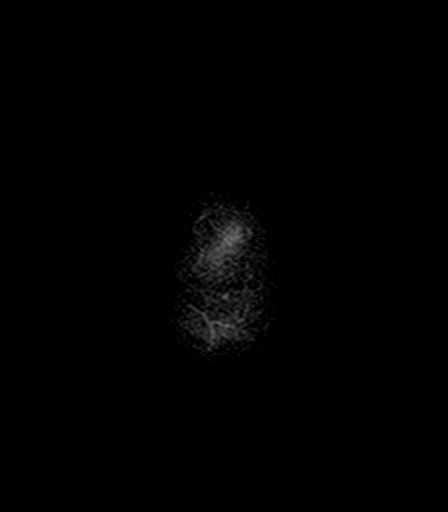

[Series 14: T2 · coronal · 5.0mm · 0.72mm/px · 2 of 23 slices shown (2 of 2)]
[im 1/23]
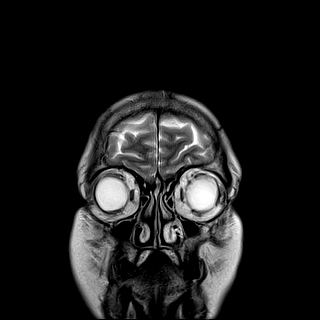
[im 23/23]
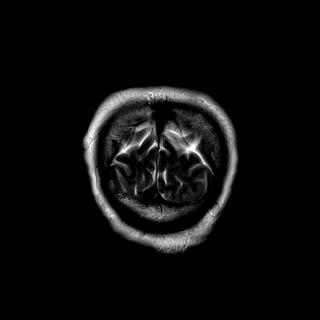

[Series 16: T1 post-contrast · coronal · 5.0mm · 0.34mm/px · 3 of 29 slices shown]
[im 1/29]
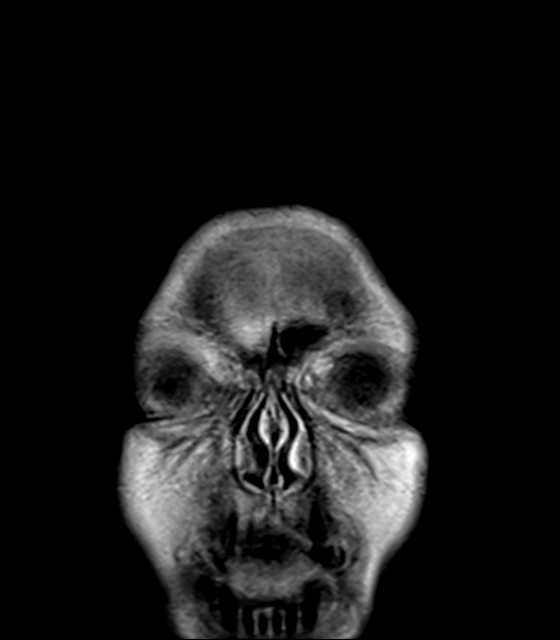
[im 15/29]
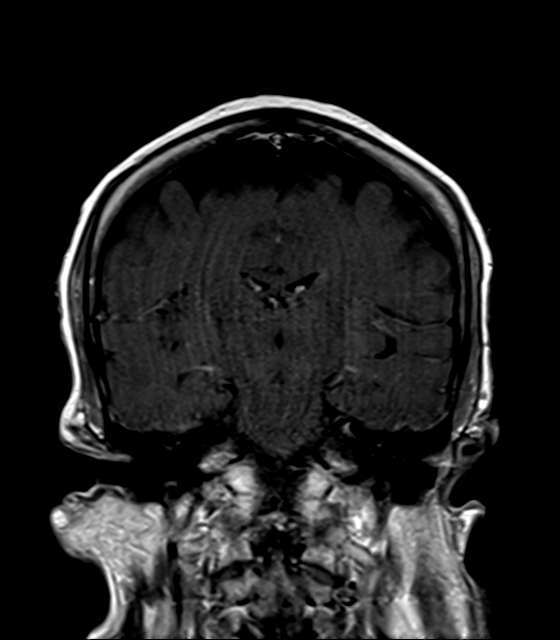
[im 29/29]
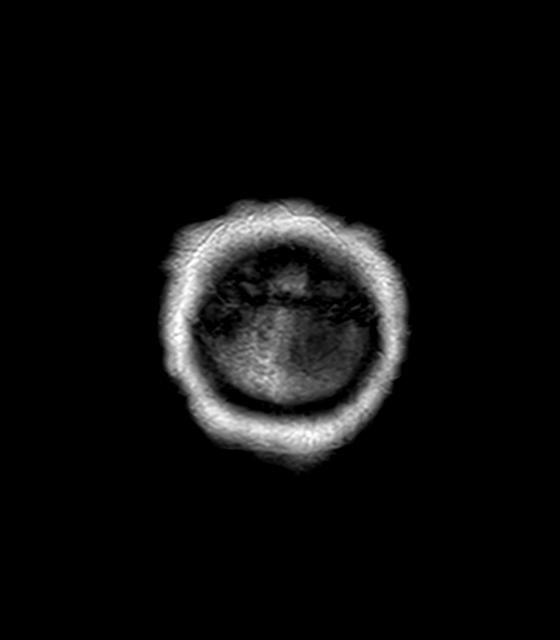

[38 of 48 positions shown; findings below may reference images not displayed]

FINDINGS: MRI HEAD FINDINGS

Brain: No acute infarction or hemorrhage. No intracranial mass, mass
effect, edema, hydrocephalus, or extra-axial collection. Ventricles
and sulci are normal in size and configuration.

Vascular: Major vessel flow voids at the skull base are preserved.

Skull and upper cervical spine: Marrow signal is within normal
limits.

Sinuses/Orbits: Minor mucosal thickening.  Orbits are unremarkable.

Other: Partially empty sella.  Mastoid air cells are clear.

MRA HEAD FINDINGS

Motion artifact is present.

Anterior circulation: Intracranial internal carotid arteries are
patent. Anterior and middle cerebral arteries are patent.

Posterior circulation: Intracranial vertebral arteries patent.
Basilar artery is patent. Posterior cerebral arteries are patent.
Bilateral posterior communicating arteries are present.
IMPRESSION: No acute infarction, hemorrhage, or mass.

Vascular imaging is degraded by motion but without significant
abnormality.

## 2022-11-20 IMAGING — CT CT VENOGRAM HEAD
3 of 8 series · 16 of 47 positions shown · non-contrast
Comparison: 07/13/2021 CT head, correlation is also made with
07/13/2021 MRI MRA.

CLINICAL DATA: Severe headache, hypertension, dural venous sinus
thrombosis suspected

EXAM:
CT VENOGRAM HEAD
TECHNIQUE: Venographic phase images of the brain were obtained following the
administration of intravenous contrast. Multiplanar reformats and
maximum intensity projections were generated.

[Series 4: coronal soft tissue · coronal · 0.32mm/px · 2 of 73 slices shown]
[im 25/73  brain]
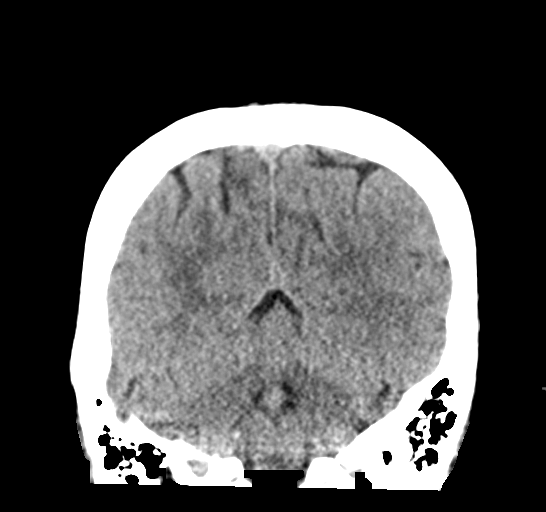
[im 49/73  brain]
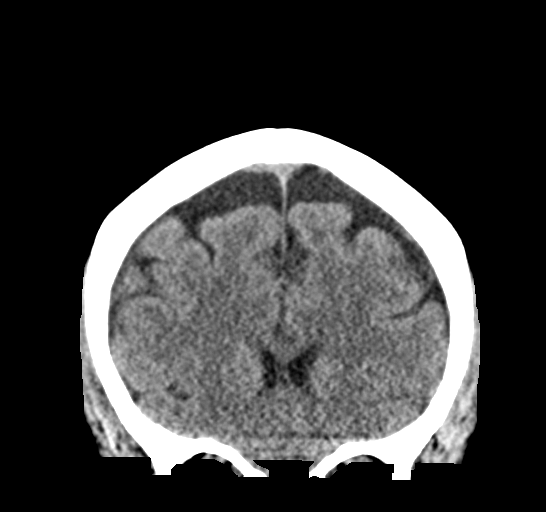

[Series 5: sagittal soft tissue · sagittal · 0.33mm/px · 1 of 57 slices shown]
[im 44/57  brain]
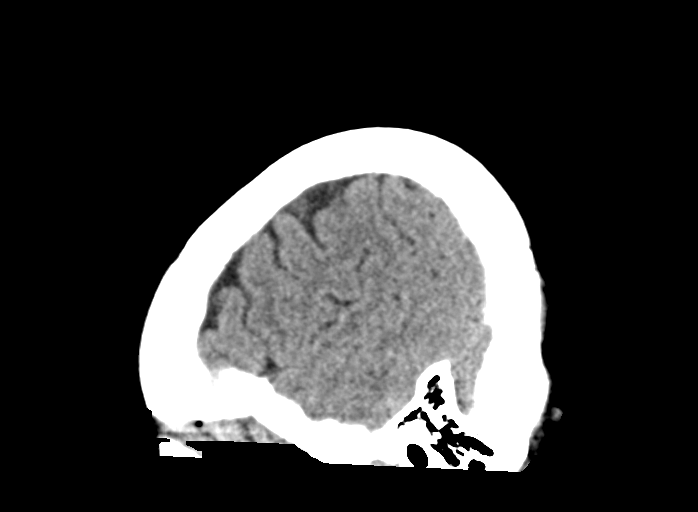

[Series 7: ax thin · axial · 0.35mm/px · z∈[+55,+191]mm · 13 of 500 slices shown]
[im 23/500  brain]
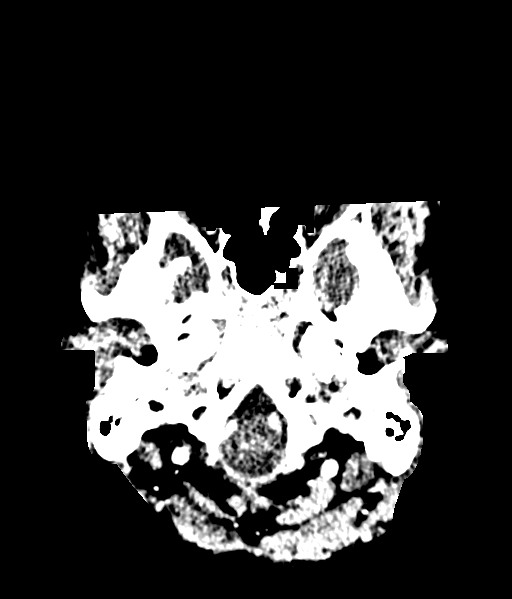
[im 69/500  bone]
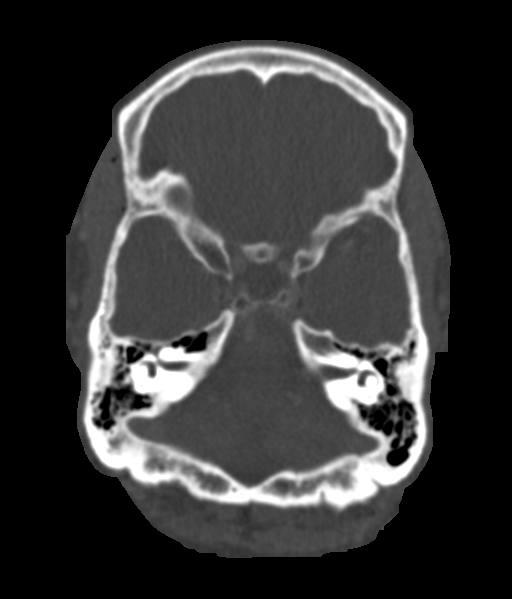
[im 114/500  brain]
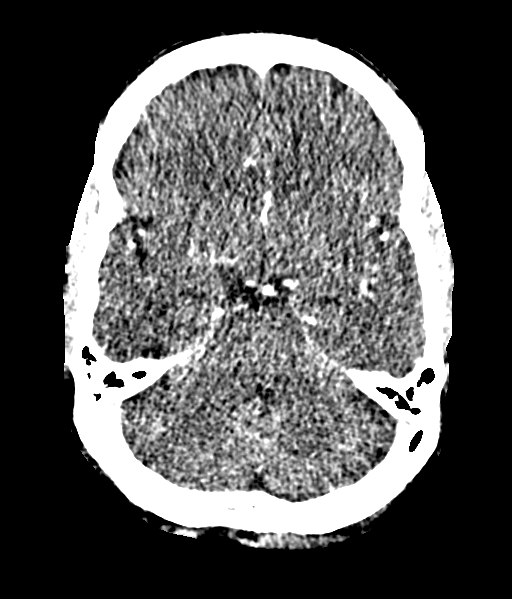
[im 137/500  bone]
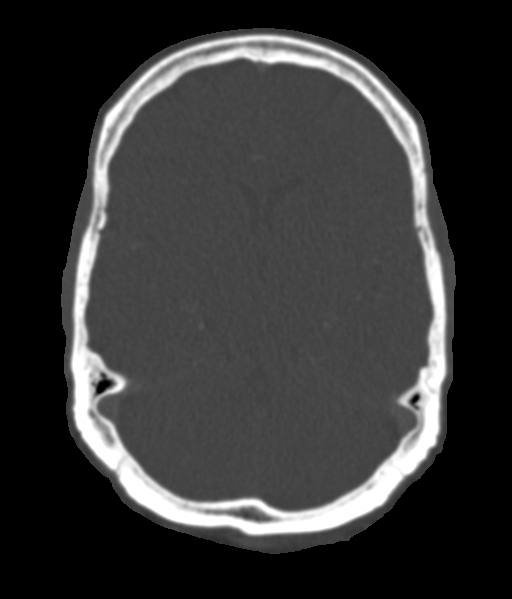
[im 182/500  brain]
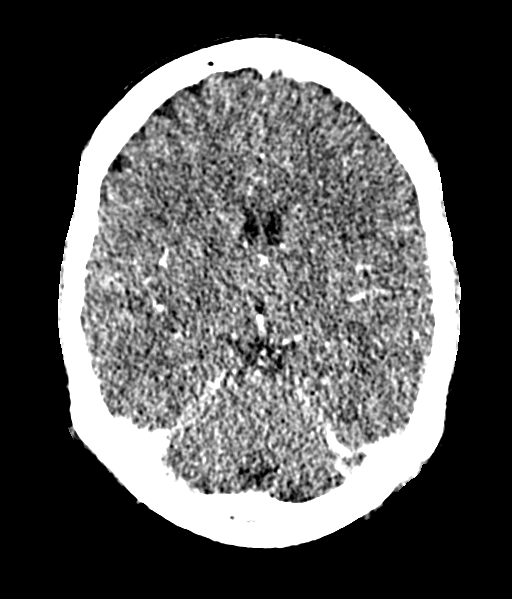
[im 205/500  bone]
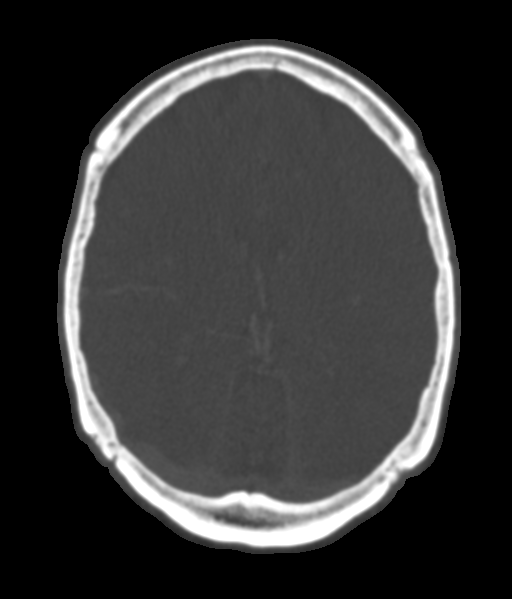
[im 250/500  brain]
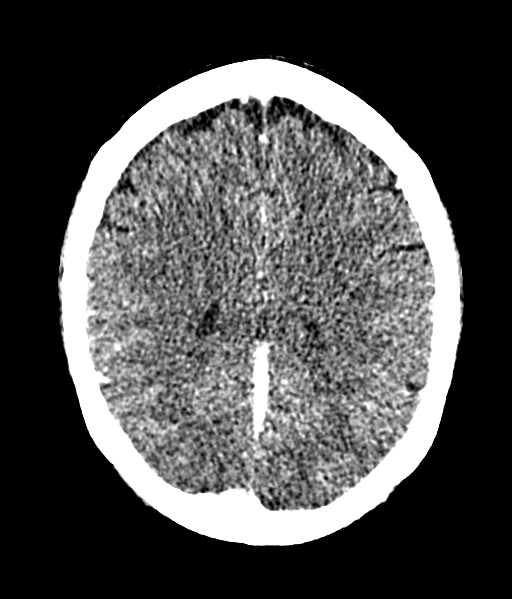
[im 295/500  bone]
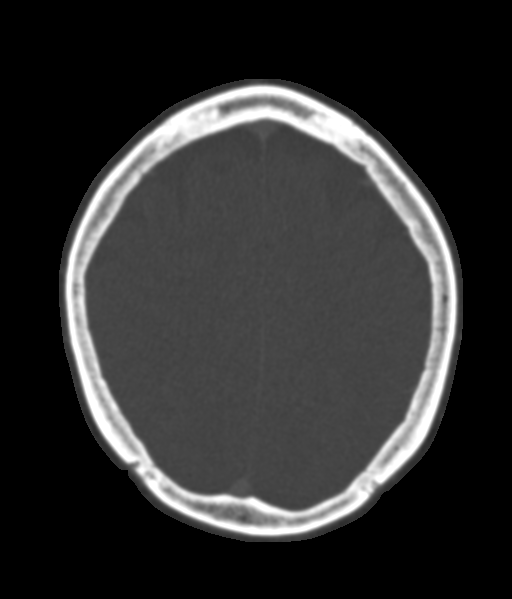
[im 318/500  brain]
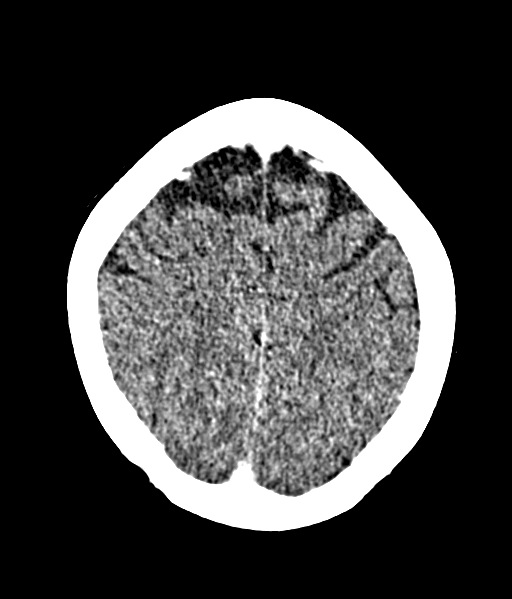
[im 363/500  bone]
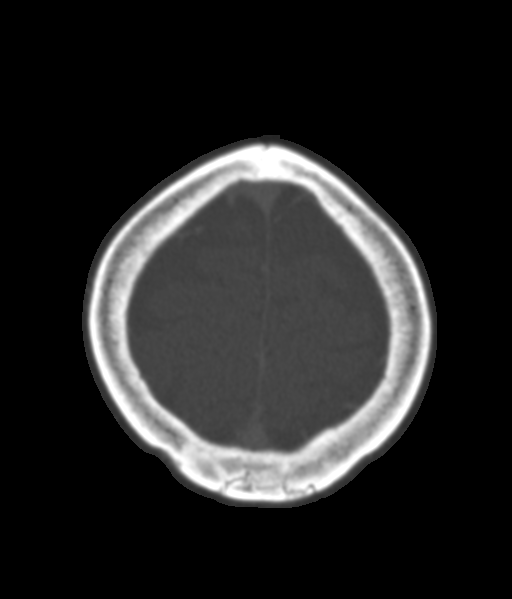
[im 386/500  brain]
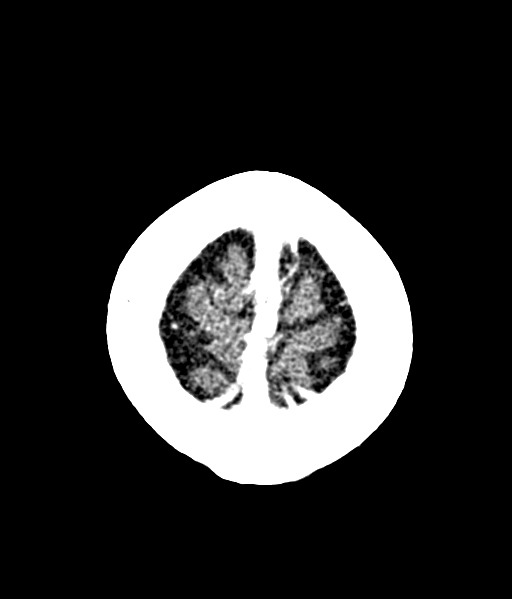
[im 431/500  bone]
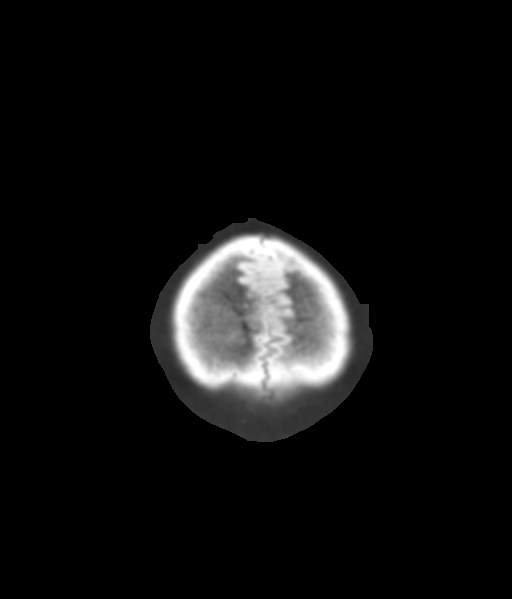
[im 477/500  brain]
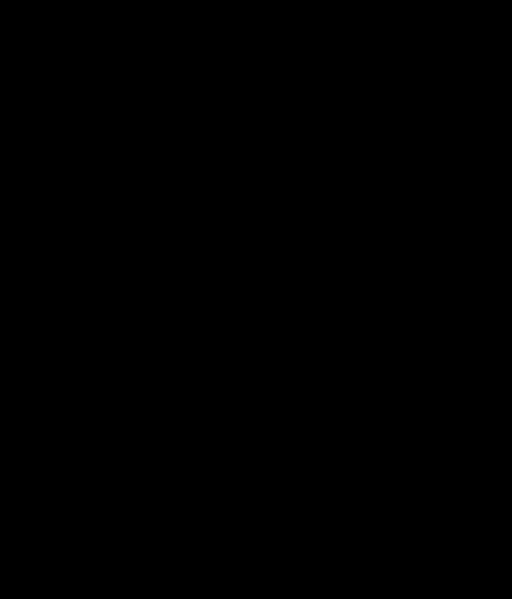

[16 of 47 positions shown; findings below may reference images not displayed]

RADIATION DOSE REDUCTION: This exam was performed according to the
departmental dose-optimization program which includes automated
exposure control, adjustment of the mA and/or kV according to
patient size and/or use of iterative reconstruction technique.

CONTRAST:  75mL OMNIPAQUE IOHEXOL 350 MG/ML SOLN
FINDINGS: Brain: No evidence of acute infarction, hemorrhage, cerebral edema,
mass, mass effect, or midline shift. No hydrocephalus or extra-axial
fluid collection.

Vascular: No hyperdense vessel.

Skull: Normal. Negative for fracture or focal lesion.

Sinuses/Orbits: Limited imaging is unremarkable.

Other: The mastoid air cells are well aerated.

Superior sagittal sinus: Normal.

Straight sinus: Normal.

Inferior sagittal sinus, vein of Samsonaite and internal cerebral veins:
Normal.

Transverse sinuses: Normal.

Sigmoid sinuses: Normal.

Visualized jugular veins: Normal
IMPRESSION: 1.  No acute intracranial process.
2. No evidence of dural venous sinus thrombosis or stenosis.

## 2022-11-22 NOTE — Progress Notes (Signed)
This encounter was created in error - please disregard.

## 2023-02-20 ENCOUNTER — Emergency Department (HOSPITAL_COMMUNITY)
Admission: EM | Admit: 2023-02-20 | Discharge: 2023-02-21 | Disposition: A | Discharge status: Home / Self Care | Payer: Medicare Other | Attending: Emergency Medicine | Admitting: Emergency Medicine

## 2023-02-20 ENCOUNTER — Encounter (HOSPITAL_COMMUNITY): Payer: Self-pay

## 2023-02-20 ENCOUNTER — Other Ambulatory Visit: Payer: Self-pay

## 2023-02-20 DIAGNOSIS — R1012 Left upper quadrant pain: Secondary | ICD-10-CM | POA: Insufficient documentation

## 2023-02-20 DIAGNOSIS — R1011 Right upper quadrant pain: Secondary | ICD-10-CM | POA: Diagnosis not present

## 2023-02-20 DIAGNOSIS — R1013 Epigastric pain: Secondary | ICD-10-CM | POA: Diagnosis not present

## 2023-02-20 DIAGNOSIS — R1010 Upper abdominal pain, unspecified: Secondary | ICD-10-CM | POA: Diagnosis present

## 2023-02-20 DIAGNOSIS — R1084 Generalized abdominal pain: Secondary | ICD-10-CM

## 2023-02-20 NOTE — ED Triage Notes (Signed)
RUQ ABD pain N/v/d x2 days  Cramps in both legs and hands  HR 132 12 lead completed in triage

## 2023-02-21 ENCOUNTER — Emergency Department (HOSPITAL_COMMUNITY): Payer: Medicare Other

## 2023-02-21 DIAGNOSIS — R1011 Right upper quadrant pain: Secondary | ICD-10-CM | POA: Diagnosis not present

## 2023-02-21 LAB — COMPREHENSIVE METABOLIC PANEL
ALT: 56 U/L — ABNORMAL HIGH (ref 0–44)
AST: 99 U/L — ABNORMAL HIGH (ref 15–41)
Albumin: 4.5 g/dL (ref 3.5–5.0)
Alkaline Phosphatase: 100 U/L (ref 38–126)
Anion gap: 13 (ref 5–15)
BUN: <5 mg/dL — ABNORMAL LOW (ref 6–20)
CO2: 23 mmol/L (ref 22–32)
Calcium: 9.4 mg/dL (ref 8.9–10.3)
Chloride: 97 mmol/L — ABNORMAL LOW (ref 98–111)
Creatinine, Ser: 0.5 mg/dL (ref 0.44–1.00)
GFR, Estimated: >60 mL/min (ref >60)
Glucose, Bld: 116 mg/dL — ABNORMAL HIGH (ref 70–99)
Potassium: 4.3 mmol/L (ref 3.5–5.1)
Sodium: 133 mmol/L — ABNORMAL LOW (ref 135–145)
Total Bilirubin: 2.1 mg/dL — ABNORMAL HIGH (ref <1.2)
Total Protein: 8.6 g/dL — ABNORMAL HIGH (ref 6.5–8.1)

## 2023-02-21 LAB — CBC
HCT: 45.3 % (ref 36.0–46.0)
Hemoglobin: 16.2 g/dL — ABNORMAL HIGH (ref 12.0–15.0)
MCH: 28.9 pg (ref 26.0–34.0)
MCHC: 35.8 g/dL (ref 30.0–36.0)
MCV: 80.9 fL (ref 80.0–100.0)
Platelets: 243 10*3/uL (ref 150–400)
RBC: 5.6 MIL/uL — ABNORMAL HIGH (ref 3.87–5.11)
RDW: 19.2 % — ABNORMAL HIGH (ref 11.5–15.5)
WBC: 5.1 10*3/uL (ref 4.0–10.5)
nRBC: 1 % — ABNORMAL HIGH (ref 0.0–0.2)

## 2023-02-21 LAB — URINALYSIS, ROUTINE W REFLEX MICROSCOPIC
Bilirubin Urine: NEGATIVE
Glucose, UA: NEGATIVE mg/dL
Ketones, ur: 20 mg/dL — AB
Nitrite: NEGATIVE
Protein, ur: >=300 mg/dL — AB
Specific Gravity, Urine: 1.025 (ref 1.005–1.030)
pH: 5 (ref 5.0–8.0)

## 2023-02-21 LAB — LIPASE, BLOOD: Lipase: 22 U/L (ref 11–51)

## 2023-02-21 MED ORDER — SODIUM CHLORIDE 0.9 % IV BOLUS
1000.0000 mL | Freq: Once | INTRAVENOUS | Status: AC
  Administered 2023-02-21: 1000 mL via INTRAVENOUS

## 2023-02-21 MED ORDER — DIPHENHYDRAMINE HCL 50 MG/ML IJ SOLN
25.0000 mg | Freq: Once | INTRAMUSCULAR | Status: AC
  Administered 2023-02-21: 25 mg via INTRAVENOUS
  Filled 2023-02-21: qty 1

## 2023-02-21 MED ORDER — SODIUM CHLORIDE 0.9 % IV SOLN
12.5000 mg | Freq: Once | INTRAVENOUS | Status: AC
  Administered 2023-02-21: 12.5 mg via INTRAVENOUS
  Filled 2023-02-21: qty 0.5

## 2023-02-21 MED ORDER — HYDROMORPHONE HCL 1 MG/ML IJ SOLN
2.0000 mg | Freq: Once | INTRAMUSCULAR | Status: AC
  Administered 2023-02-21: 2 mg via INTRAMUSCULAR
  Filled 2023-02-21: qty 2

## 2023-02-21 MED ORDER — HYDROMORPHONE HCL 1 MG/ML IJ SOLN
1.0000 mg | Freq: Once | INTRAMUSCULAR | Status: AC
  Administered 2023-02-21: 1 mg via INTRAVENOUS
  Filled 2023-02-21: qty 1

## 2023-02-21 MED ORDER — PROMETHAZINE HCL 25 MG/ML IJ SOLN
INTRAMUSCULAR | Status: AC
  Filled 2023-02-21: qty 1

## 2023-02-21 MED ORDER — ONDANSETRON 4 MG PO TBDP
4.0000 mg | ORAL_TABLET | Freq: Once | ORAL | Status: AC
  Administered 2023-02-21: 4 mg via ORAL
  Filled 2023-02-21: qty 1

## 2023-02-21 MED ORDER — HYDROCODONE-ACETAMINOPHEN 5-325 MG PO TABS: 1.0000 | ORAL_TABLET | Freq: Four times a day (QID) | ORAL | 0 refills | Status: DC | PRN

## 2023-02-21 MED ORDER — ONDANSETRON 8 MG PO TBDP
8.0000 mg | ORAL_TABLET | Freq: Once | ORAL | Status: AC
  Administered 2023-02-21: 8 mg via ORAL
  Filled 2023-02-21: qty 1

## 2023-02-21 NOTE — ED Notes (Signed)
Lab states that light green top is hemolyzed and will require recollect.

## 2023-02-21 NOTE — ED Notes (Signed)
Patient verbalizes understanding of discharge instructions. Opportunity for questioning and answers were provided. Armband removed by staff, pt discharged from ED. Ambulated out to car with son

## 2023-02-21 NOTE — ED Notes (Signed)
Phlebotomy attempted blood draw x2 in triage without success.  I attempted IV placement x2 without success.

## 2023-02-21 NOTE — ED Notes (Signed)
Pt itching states happens when she gets narcotics. EDP made aware.

## 2023-02-21 NOTE — Discharge Instructions (Signed)
Take hydrocodone as prescribed as needed for pain.  Follow-up with your primary doctor in the next few days.

## 2023-02-21 NOTE — ED Notes (Signed)
Patient transported to CT 

## 2023-02-21 NOTE — ED Provider Notes (Signed)
[place] EMERGENCY DEPARTMENT AT [facility] Provider Note   CSN: [id number] Arrival date & time: 02/20/23  2324     History  Chief Complaint  Patient presents with   Abdominal Pain    Angel Price is a 57 y.o. female.  Patient is a 57 year old female with history of multiple abdominal surgeries related to hernias and bowel obstructions.  She also has history of schizoaffective and bipolar.  Patient presenting today with complaints of upper abdominal pain, nausea, vomiting, and diarrhea for the past 2 days.  She denies any fevers or chills.  No bloody stool or vomit.  She denies ill contacts or having consumed any undercooked or suspicious foods.  The history is provided by the patient.       Home Medications Prior to Admission medications   Medication Sig Start Date End Date Taking? Authorizing Provider  amLODipine (NORVASC) 10 MG tablet Take 10 mg by mouth daily. 05/18/21   [provider]  aspirin EC 81 MG EC tablet Take 1 tablet (81 mg total) by mouth daily. 05/19/19   Elgergawy, Leana Roe, MD  atenolol (TENORMIN) 50 MG tablet Take 50 mg by mouth daily. 05/18/21   [provider]  baclofen (LIORESAL) 10 MG tablet Take 1 tablet 3 times a day by oral route.    [provider]  Cholecalciferol 50 MCG (2000 UT) CAPS Take 1 tablet by mouth daily.    [provider]  clopidogrel (PLAVIX) 75 MG tablet Take 75 mg by mouth daily. 05/25/22   [provider]  dicyclomine (BENTYL) 10 MG capsule Take by mouth. 12/09/18   [provider]  diltiazem (CARDIZEM CD) 240 MG 24 hr capsule Take 1 capsule every day by oral route.    [provider]  Docusate Sodium (DSS) 100 MG CAPS Take 1 capsule by mouth 2 (two) times daily.    [provider]  DULoxetine (CYMBALTA) 30 MG capsule Take 30 mg by mouth 2 (two) times daily. 08/14/21   [provider]  escitalopram (LEXAPRO) 20 MG tablet Take 1 tablet by mouth  daily.    [provider]  esomeprazole (NEXIUM) 40 MG capsule Take 1 capsule (40 mg total) by mouth 2 (two) times daily before a meal. Patient taking differently: Take 40 mg by mouth daily before breakfast. 09/14/21   Letta Median, PA-C  hydroxychloroquine (PLAQUENIL) 200 MG tablet Take 1.5 tablets (300 mg total) by mouth daily. 10/31/22   Fuller Plan, MD  ibuprofen (ADVIL) 800 MG tablet Take 1 tablet by mouth 2 (two) times daily as needed.    [provider]  isosorbide mononitrate (IMDUR) 60 MG 24 hr tablet TAKE 1/2 TABLET BY MOUTH DAILY 06/25/21   Patwardhan, Manish J, MD  levocetirizine (XYZAL) 5 MG tablet Take 1 tablet by mouth daily.    [provider]  lisinopril-hydrochlorothiazide (ZESTORETIC) 20-12.5 MG tablet Take 1 tablet by mouth every morning. 10/31/20   [provider]  methocarbamol (ROBAXIN) 500 MG tablet Take 500 mg by mouth 2 (two) times daily. 10/31/21   [provider]  metoprolol tartrate (LOPRESSOR) 50 MG tablet Take by mouth. 01/06/20   [provider]  mirtazapine (REMERON) 15 MG tablet Take 15 mg by mouth at bedtime as needed. 08/10/21   [provider]  nitroGLYCERIN (NITROSTAT) 0.4 MG SL tablet Place 1 tablet (0.4 mg total) under the tongue every 5 (five) minutes as needed for chest pain. 12/29/19 09/21/21  Patwardhan, [employer]  J, MD  oxyCODONE (OXY IR/ROXICODONE) 5 MG immediate release tablet Take by mouth. Patient not taking: Reported on 10/31/2022 01/03/22   [provider]  oxyCODONE-acetaminophen (PERCOCET/ROXICET) 5-325 MG tablet Take 1 tablet by mouth every 6 (six) hours as needed for severe pain. 05/16/22   Bethann Berkshire, MD  pantoprazole (PROTONIX) 40 MG tablet Take 1 tablet by mouth daily.    [provider]  prochlorperazine (COMPAZINE) 10 MG tablet     [provider]  promethazine (PHENERGAN) 12.5 MG tablet Take 12.5 mg by mouth every 6 (six) hours as needed for  nausea or vomiting.  12/18/19   [provider]  Rimegepant Sulfate (NURTEC) 75 MG TBDP Take 75 mg by mouth as needed. 09/12/21   Ocie Doyne, MD  rosuvastatin (CRESTOR) 20 MG tablet Take 1 tablet (20 mg total) by mouth daily. 06/25/19 11/06/28  Patwardhan, Anabel Bene, MD  SUMAtriptan (IMITREX) 50 MG tablet TAKE 1 TABLET BY MOUTH EVERY 2 HOURS AS NEEDED FOR MIGRAINES MAY REPEAT IN 2 HOURS    [provider]  thiamine (VITAMIN B1) 100 MG tablet Take 100 mg by mouth daily. 08/09/22   [provider]  thiamine 100 MG tablet Take 100 mg by mouth daily. 05/18/21   [provider]  zolpidem (AMBIEN CR) 12.5 MG CR tablet Take by mouth. 08/03/13   [provider]      Allergies    Metoclopramide, Codeine, and Reglan [metoclopramide]    Review of Systems   Review of Systems  All other systems reviewed and are negative.   Physical Exam Updated Vital Signs BP (!) 139/109   Pulse (!) 108   Temp 98.3 F (36.8 C) (Oral)   Resp 13   Ht 5\' 3"  (1.6 m)   Wt 67.6 kg   LMP  (LMP Unknown)   SpO2 99%   BMI 26.39 kg/m  Physical Exam Vitals and nursing note reviewed.  Constitutional:      General: She is not in acute distress.    Appearance: She is well-developed. She is not diaphoretic.  HENT:     Head: Normocephalic and atraumatic.  Cardiovascular:     Rate and Rhythm: Normal rate and regular rhythm.     Heart sounds: No murmur heard.    No friction rub. No gallop.  Pulmonary:     Effort: Pulmonary effort is normal. No respiratory distress.     Breath sounds: Normal breath sounds. No wheezing.  Abdominal:     General: Bowel sounds are normal. There is no distension.     Palpations: Abdomen is soft.     Tenderness: There is abdominal tenderness in the right upper quadrant, epigastric area and left upper quadrant. There is no right CVA tenderness, left CVA tenderness, guarding or rebound.     Comments: There are multiple prior surgical scars noted.   Musculoskeletal:        General: Normal range of motion.     Cervical back: Normal range of motion and neck supple.  Skin:    General: Skin is warm and dry.  Neurological:     General: No focal deficit present.     Mental Status: She is alert and oriented to person, place, and time.     ED Results / Procedures / Treatments   Labs (all labs ordered are listed, but only abnormal results are displayed) Labs Reviewed  CBC - Abnormal; Notable for the following components:      Result Value   RBC  5.60 (*)    Hemoglobin 16.2 (*)    RDW 19.2 (*)    nRBC 1.0 (*)    All other components within normal limits  URINALYSIS, ROUTINE W REFLEX MICROSCOPIC - Abnormal; Notable for the following components:   Color, Urine AMBER (*)    Hgb urine dipstick SMALL (*)    Ketones, ur 20 (*)    Protein, ur >=300 (*)    Leukocytes,Ua TRACE (*)    Bacteria, UA RARE (*)    All other components within normal limits  COMPREHENSIVE METABOLIC PANEL  LIPASE, BLOOD    EKG None  Radiology No results found.  Procedures Procedures    Medications Ordered in ED Medications  ondansetron (ZOFRAN-ODT) disintegrating tablet 4 mg (4 mg Oral Given 02/21/23 0049)    ED Course/ Medical Decision Making/ A&P  Patient is a 57 year old female presenting with abdominal pain as described in the HPI.  She has had multiple abdominal surgeries and has extensive scarring to her abdominal wall.  Patient arrives here with stable vital signs and is afebrile.  She has generalized abdominal tenderness.  Workup initiated including CBC, CMP, and lipase.  She has no leukocytosis, no electrolyte derangement, and normal lipase.  She does have trivial elevations of her LFTs, the significance of which I am uncertain.  Urinalysis inconsistent with infection.  CT scan of the abdomen and pelvis obtained showing evidence of prior cholecystectomy and right adrenalectomy surgery, but nothing acute.  She has received Dilaudid for pain  and Zofran and Phenergan for nausea.  Patient will be discharged with pain medication and follow-up with her surgeon/primary doctor.  Final Clinical Impression(s) / ED Diagnoses Final diagnoses:  None    Rx / DC Orders ED Discharge Orders     None         Geoffery Lyons, MD 02/21/23 [id number]

## 2023-02-21 NOTE — ED Notes (Signed)
Second RN attempted IV access x2 also without success

## 2023-03-14 ENCOUNTER — Emergency Department (HOSPITAL_COMMUNITY): Payer: Medicare Other

## 2023-03-14 ENCOUNTER — Inpatient Hospital Stay (HOSPITAL_COMMUNITY)
Admission: EM | Admit: 2023-03-14 | Discharge: 2023-03-16 | DRG: 057 | Disposition: A | Discharge status: Home / Self Care | Payer: Medicare Other | Attending: Internal Medicine | Admitting: Internal Medicine

## 2023-03-14 ENCOUNTER — Encounter (HOSPITAL_COMMUNITY): Payer: Self-pay | Admitting: *Deleted

## 2023-03-14 ENCOUNTER — Other Ambulatory Visit: Payer: Self-pay

## 2023-03-14 DIAGNOSIS — F419 Anxiety disorder, unspecified: Secondary | ICD-10-CM | POA: Diagnosis present

## 2023-03-14 DIAGNOSIS — Z7982 Long term (current) use of aspirin: Secondary | ICD-10-CM | POA: Diagnosis not present

## 2023-03-14 DIAGNOSIS — F32A Depression, unspecified: Secondary | ICD-10-CM | POA: Diagnosis present

## 2023-03-14 DIAGNOSIS — Z7902 Long term (current) use of antithrombotics/antiplatelets: Secondary | ICD-10-CM

## 2023-03-14 DIAGNOSIS — H538 Other visual disturbances: Secondary | ICD-10-CM | POA: Diagnosis present

## 2023-03-14 DIAGNOSIS — G8194 Hemiplegia, unspecified affecting left nondominant side: Secondary | ICD-10-CM | POA: Diagnosis present

## 2023-03-14 DIAGNOSIS — D573 Sickle-cell trait: Secondary | ICD-10-CM | POA: Diagnosis present

## 2023-03-14 DIAGNOSIS — F17210 Nicotine dependence, cigarettes, uncomplicated: Secondary | ICD-10-CM | POA: Diagnosis present

## 2023-03-14 DIAGNOSIS — M329 Systemic lupus erythematosus, unspecified: Secondary | ICD-10-CM | POA: Diagnosis present

## 2023-03-14 DIAGNOSIS — K219 Gastro-esophageal reflux disease without esophagitis: Secondary | ICD-10-CM | POA: Diagnosis present

## 2023-03-14 DIAGNOSIS — I10 Essential (primary) hypertension: Secondary | ICD-10-CM | POA: Diagnosis present

## 2023-03-14 DIAGNOSIS — Z9151 Personal history of suicidal behavior: Secondary | ICD-10-CM | POA: Diagnosis not present

## 2023-03-14 DIAGNOSIS — I480 Paroxysmal atrial fibrillation: Secondary | ICD-10-CM | POA: Diagnosis present

## 2023-03-14 DIAGNOSIS — R519 Headache, unspecified: Secondary | ICD-10-CM | POA: Diagnosis present

## 2023-03-14 DIAGNOSIS — R531 Weakness: Secondary | ICD-10-CM

## 2023-03-14 DIAGNOSIS — Z79899 Other long term (current) drug therapy: Secondary | ICD-10-CM | POA: Diagnosis not present

## 2023-03-14 DIAGNOSIS — Z888 Allergy status to other drugs, medicaments and biological substances status: Secondary | ICD-10-CM | POA: Diagnosis not present

## 2023-03-14 DIAGNOSIS — E896 Postprocedural adrenocortical (-medullary) hypofunction: Secondary | ICD-10-CM | POA: Diagnosis present

## 2023-03-14 DIAGNOSIS — R200 Anesthesia of skin: Secondary | ICD-10-CM

## 2023-03-14 DIAGNOSIS — E876 Hypokalemia: Secondary | ICD-10-CM | POA: Diagnosis present

## 2023-03-14 DIAGNOSIS — Z9049 Acquired absence of other specified parts of digestive tract: Secondary | ICD-10-CM

## 2023-03-14 DIAGNOSIS — E039 Hypothyroidism, unspecified: Secondary | ICD-10-CM | POA: Diagnosis present

## 2023-03-14 DIAGNOSIS — Z885 Allergy status to narcotic agent status: Secondary | ICD-10-CM | POA: Diagnosis not present

## 2023-03-14 DIAGNOSIS — R4189 Other symptoms and signs involving cognitive functions and awareness: Principal | ICD-10-CM

## 2023-03-14 DIAGNOSIS — Z90710 Acquired absence of both cervix and uterus: Secondary | ICD-10-CM | POA: Diagnosis not present

## 2023-03-14 DIAGNOSIS — I69354 Hemiplegia and hemiparesis following cerebral infarction affecting left non-dominant side: Secondary | ICD-10-CM | POA: Diagnosis not present

## 2023-03-14 DIAGNOSIS — I69398 Other sequelae of cerebral infarction: Principal | ICD-10-CM

## 2023-03-14 DIAGNOSIS — R29818 Other symptoms and signs involving the nervous system: Principal | ICD-10-CM

## 2023-03-14 DIAGNOSIS — I6300 Cerebral infarction due to thrombosis of unspecified precerebral artery: Secondary | ICD-10-CM

## 2023-03-14 LAB — COMPREHENSIVE METABOLIC PANEL
ALT: 23 U/L (ref 0–44)
AST: 19 U/L (ref 15–41)
Albumin: 3.7 g/dL (ref 3.5–5.0)
Alkaline Phosphatase: 67 U/L (ref 38–126)
Anion gap: 8 (ref 5–15)
BUN: 8 mg/dL (ref 6–20)
CO2: 27 mmol/L (ref 22–32)
Calcium: 9.3 mg/dL (ref 8.9–10.3)
Chloride: 104 mmol/L (ref 98–111)
Creatinine, Ser: 0.54 mg/dL (ref 0.44–1.00)
GFR, Estimated: >60 mL/min (ref >60)
Glucose, Bld: 95 mg/dL (ref 70–99)
Potassium: 2.7 mmol/L — CL (ref 3.5–5.1)
Sodium: 139 mmol/L (ref 135–145)
Total Bilirubin: 0.7 mg/dL (ref <1.2)
Total Protein: 7 g/dL (ref 6.5–8.1)

## 2023-03-14 LAB — CBC
HCT: 35.2 % — ABNORMAL LOW (ref 36.0–46.0)
Hemoglobin: 11.9 g/dL — ABNORMAL LOW (ref 12.0–15.0)
MCH: 27.1 pg (ref 26.0–34.0)
MCHC: 33.8 g/dL (ref 30.0–36.0)
MCV: 80.2 fL (ref 80.0–100.0)
Platelets: 400 10*3/uL (ref 150–400)
RBC: 4.39 MIL/uL (ref 3.87–5.11)
RDW: 17.7 % — ABNORMAL HIGH (ref 11.5–15.5)
WBC: 6.4 10*3/uL (ref 4.0–10.5)
nRBC: 0 % (ref 0.0–0.2)

## 2023-03-14 LAB — MAGNESIUM: Magnesium: 2 mg/dL (ref 1.7–2.4)

## 2023-03-14 MED ORDER — METHOCARBAMOL 500 MG PO TABS
500.0000 mg | ORAL_TABLET | Freq: Two times a day (BID) | ORAL | Status: DC
  Administered 2023-03-14 – 2023-03-16 (×4): 500 mg via ORAL
  Filled 2023-03-14 (×4): qty 1

## 2023-03-14 MED ORDER — ASPIRIN 300 MG RE SUPP
300.0000 mg | Freq: Every day | RECTAL | Status: DC
Start: 2023-03-14 — End: 2023-03-16

## 2023-03-14 MED ORDER — POTASSIUM CHLORIDE 10 MEQ/100ML IV SOLN
10.0000 meq | INTRAVENOUS | Status: AC
  Administered 2023-03-14 (×3): 10 meq via INTRAVENOUS
  Filled 2023-03-14 (×2): qty 100

## 2023-03-14 MED ORDER — HYDRALAZINE HCL 20 MG/ML IJ SOLN: 5.0000 mg | INTRAMUSCULAR | Status: DC | PRN

## 2023-03-14 MED ORDER — ENOXAPARIN SODIUM 40 MG/0.4ML IJ SOSY
40.0000 mg | PREFILLED_SYRINGE | INTRAMUSCULAR | Status: DC
Start: 2023-03-14 — End: 2023-03-16
  Administered 2023-03-14 – 2023-03-15 (×2): 40 mg via SUBCUTANEOUS
  Filled 2023-03-14 (×2): qty 0.4

## 2023-03-14 MED ORDER — HYDROCODONE-ACETAMINOPHEN 5-325 MG PO TABS
2.0000 | ORAL_TABLET | Freq: Four times a day (QID) | ORAL | Status: DC | PRN
  Administered 2023-03-15 (×2): 2 via ORAL
  Filled 2023-03-14 (×2): qty 2

## 2023-03-14 MED ORDER — IOHEXOL 350 MG/ML SOLN
75.0000 mL | Freq: Once | INTRAVENOUS | Status: AC | PRN
  Administered 2023-03-14: 75 mL via INTRAVENOUS

## 2023-03-14 MED ORDER — MORPHINE SULFATE (PF) 4 MG/ML IV SOLN
4.0000 mg | Freq: Once | INTRAVENOUS | Status: AC
  Administered 2023-03-14: 4 mg via INTRAVENOUS
  Filled 2023-03-14: qty 1

## 2023-03-14 MED ORDER — HYDROXYCHLOROQUINE SULFATE 200 MG PO TABS
300.0000 mg | ORAL_TABLET | Freq: Every day | ORAL | Status: DC
  Administered 2023-03-15 – 2023-03-16 (×2): 300 mg via ORAL
  Filled 2023-03-14 (×2): qty 2

## 2023-03-14 MED ORDER — ASPIRIN 325 MG PO TABS
325.0000 mg | ORAL_TABLET | Freq: Every day | ORAL | Status: DC
Start: 2023-03-14 — End: 2023-03-16
  Administered 2023-03-14 – 2023-03-16 (×3): 325 mg via ORAL
  Filled 2023-03-14 (×3): qty 1

## 2023-03-14 MED ORDER — RIMEGEPANT SULFATE 75 MG PO TBDP: 75.0000 mg | ORAL_TABLET | Freq: Every day | ORAL | Status: DC

## 2023-03-14 MED ORDER — ACETAMINOPHEN 325 MG PO TABS: 650.0000 mg | ORAL_TABLET | Freq: Once | ORAL | Status: DC

## 2023-03-14 MED ORDER — STROKE: EARLY STAGES OF RECOVERY BOOK: Freq: Once | Status: AC

## 2023-03-14 MED ORDER — CLOPIDOGREL BISULFATE 75 MG PO TABS
75.0000 mg | ORAL_TABLET | Freq: Every day | ORAL | Status: DC
Start: 2023-03-15 — End: 2023-03-16
  Administered 2023-03-15 – 2023-03-16 (×2): 75 mg via ORAL
  Filled 2023-03-14 (×2): qty 1

## 2023-03-14 MED ORDER — ROSUVASTATIN CALCIUM 20 MG PO TABS
20.0000 mg | ORAL_TABLET | Freq: Every day | ORAL | Status: DC
  Administered 2023-03-15 – 2023-03-16 (×2): 20 mg via ORAL
  Filled 2023-03-14: qty 1
  Filled 2023-03-14: qty 2
  Filled 2023-03-14: qty 1

## 2023-03-14 MED ORDER — PANTOPRAZOLE SODIUM 40 MG PO TBEC
40.0000 mg | DELAYED_RELEASE_TABLET | Freq: Every day | ORAL | Status: DC
Start: 2023-03-14 — End: 2023-03-16
  Administered 2023-03-14 – 2023-03-16 (×3): 40 mg via ORAL
  Filled 2023-03-14 (×3): qty 1

## 2023-03-14 MED ORDER — ESCITALOPRAM OXALATE 10 MG PO TABS
20.0000 mg | ORAL_TABLET | Freq: Every day | ORAL | Status: DC
  Administered 2023-03-15 – 2023-03-16 (×2): 20 mg via ORAL
  Filled 2023-03-14 (×3): qty 2

## 2023-03-14 MED ORDER — MIRTAZAPINE 15 MG PO TABS
15.0000 mg | ORAL_TABLET | Freq: Every evening | ORAL | Status: DC | PRN
Start: 2023-03-14 — End: 2023-03-16

## 2023-03-14 MED ORDER — CYCLOBENZAPRINE HCL 10 MG PO TABS: 10.0000 mg | ORAL_TABLET | Freq: Three times a day (TID) | ORAL | Status: DC | PRN

## 2023-03-14 MED ORDER — DULOXETINE HCL 30 MG PO CPEP
30.0000 mg | ORAL_CAPSULE | Freq: Two times a day (BID) | ORAL | Status: DC
  Administered 2023-03-14 – 2023-03-16 (×4): 30 mg via ORAL
  Filled 2023-03-14 (×4): qty 1

## 2023-03-14 MED ORDER — GABAPENTIN 100 MG PO CAPS
100.0000 mg | ORAL_CAPSULE | Freq: Three times a day (TID) | ORAL | Status: DC
  Administered 2023-03-14 – 2023-03-16 (×5): 100 mg via ORAL
  Filled 2023-03-14 (×5): qty 1

## 2023-03-14 NOTE — H&P (Signed)
History and Physical    Patient: Angel Price QMV:[id number] DOB: [birth year] DOA: 03/14/2023 DOS: the patient was seen and examined on 03/14/2023 PCP: Benetta Spar, MD  Patient coming from: Home  Chief Complaint:  Chief Complaint  Patient presents with   Headache   HPI: Angel Price is a 57 y.o. female with medical history significant of SLE, multiple abdominal surgeries, hypertension, migraines, GERD.  Patient started having headaches yesterday and weakness of his left side.  She has a history of 2 strokes and is on Plavix.  She has been consistent with her medications.  She reports no focal deficits after her strokes.  Her symptoms were worsening so the patient came to the hospital for evaluation.  Review of Systems: As mentioned in the history of present illness. All other systems reviewed and are negative. Past Medical History:  Diagnosis Date   Abscess    soft tissue   Adrenal mass (HCC)    Alcohol abuse    Anemia    Anxiety    Blood transfusion without reported diagnosis    Chronic abdominal pain    Chronic wound infection of abdomen    Colon polyp    colonoscopy 04/2014   Depression    Discoid lupus erythematosus    Diverticulosis    colonoscopy 04/2014 moderat pan colonic   Drug-seeking behavior    Gastritis    EGD 05/2014   Gastroparesis 01/2014   GERD (gastroesophageal reflux disease)    Hemorrhoid    internal large   Hiatal hernia    History of Billroth II operation    Hypertension    Lung nodule    CT 02/2014 needs repeat 1 month   Lung nodule < 6cm on CT 04/25/2014   Lupus    Malingering    Migraine    Nausea and vomiting    chronic, recurrent   Pancreatitis    Schatzki's ring    patent per EGD 04/2014   Sickle cell trait (HCC)    Stroke (HCC)    Suicide attempt (HCC)    Thyroid disease 2000   overactive, radiation   Past Surgical History:  Procedure Laterality Date   ABDOMINAL HYSTERECTOMY  2013   [place]   ABDOMINAL  HYSTERECTOMY     ABDOMINAL SURGERY     ADRENALECTOMY Right    AGILE CAPSULE N/A 01/05/2015   Procedure: AGILE CAPSULE;  Surgeon: Corbin Ade, MD;  [facility];  Service: Endoscopy;  Laterality: N/A;  0700   BALLOON DILATION N/A 10/01/2021   Procedure: BALLOON DILATION;  Surgeon: Lanelle Bal, DO;  [facility]: [facility];  Service: Endoscopy;  Laterality: N/A;   Billroth II procedure      [place], first 2000, 2005/2006.   bilroth 2     BIOPSY  05/20/2013   Procedure: BIOPSIES OF ASCENDING AND SIGMOID COLON;  Surgeon: Corbin Ade, MD;  Location: [facility];  Service: Endoscopy;;   BIOPSY  04/26/2014   Procedure: BIOPSIES;  Surgeon: West Bali, MD;  Location: [facility];  Service: Endoscopy;;   BIOPSY  12/24/2018   Procedure: BIOPSY;  Surgeon: Corbin Ade, MD;  [facility];  Service: Endoscopy;;  colon   CHOLECYSTECTOMY     COLONOSCOPY     in [place]   COLONOSCOPY WITH PROPOFOL N/A 05/20/2013   Dr.Rourk- inadequate prep, normal appearing rectum, grossly normal colon aside from pancolonic diverticula, normal terminal ileum bx= unremarkable colonic mucosa. Due for early interval 2016.  COLONOSCOPY WITH PROPOFOL N/A 04/26/2014   JJO:ACZYSA ileum/one colon polyp removed/moderate pan-colonic diverticulosis/large internal hemorrhoids   COLONOSCOPY WITH PROPOFOL N/A 12/23/2014   Dr.Rourk- minimal internal hemorrhoids, pancolonic diverticulosis   COLONOSCOPY WITH PROPOFOL N/A 08/23/2016   Procedure: COLONOSCOPY WITH PROPOFOL;  Surgeon: West Bali, MD;  [facility];  Service: Endoscopy;  Laterality: N/A;   COLONOSCOPY WITH PROPOFOL N/A 12/24/2018   Pancolonic diverticulosis, normal TI, one 5 mm polyp in rectum (tubular adenoma). Somewhat friable hemorrhagic mucosa in area of IC valve/cecum, possibly related to scope trauma s/p biopsy. Repeat in 7 years.   DEBRIDEMENT OF ABDOMINAL WALL ABSCESS N/A 02/08/2013   Procedure: DEBRIDEMENT OF  ABDOMINAL WALL ABSCESS;  Surgeon: Dalia Heading, MD;  Location: [facility];  Service: General;  Laterality: N/A;   ESOPHAGOGASTRODUODENOSCOPY (EGD) WITH PROPOFOL N/A 05/20/2013   Dr.Rourk- s/p prior gastric surgery with normal esophagus, residual gastric mucosa and patent efferent limb   ESOPHAGOGASTRODUODENOSCOPY (EGD) WITH PROPOFOL N/A 02/03/2014   Dr. Jena Gauss:  s/p hemigastrectomy with retained gastric contents. Residual gastric mucosa and efferent limb appeared normal otherwise. Query gastroparesis.    ESOPHAGOGASTRODUODENOSCOPY (EGD) WITH PROPOFOL N/A 04/26/2014   YTK:ZSWFUXNA'T ring/small HH/mild non-erosive gasrtitis/normal anastomosis   ESOPHAGOGASTRODUODENOSCOPY (EGD) WITH PROPOFOL N/A 12/23/2014   Dr.Rourk- s/p prior hemigastrctomy, active oozing from anastomotic suture site, hemostasis achieved   ESOPHAGOGASTRODUODENOSCOPY (EGD) WITH PROPOFOL N/A 05/23/2016   Dr. 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    ESOPHAGOGASTRODUODENOSCOPY (EGD) WITH PROPOFOL N/A 05/11/2017   Procedure: ESOPHAGOGASTRODUODENOSCOPY (EGD) WITH PROPOFOL;  Surgeon: Corbin Ade, MD;  [facility];  Service: Endoscopy;  Laterality: N/A;   ESOPHAGOGASTRODUODENOSCOPY (EGD) WITH PROPOFOL N/A 06/07/2021   Procedure: ESOPHAGOGASTRODUODENOSCOPY (EGD) WITH PROPOFOL;  Surgeon: Corbin Ade, MD;  [facility];  Service: Endoscopy;  Laterality: N/A;  11:45am   ESOPHAGOGASTRODUODENOSCOPY (EGD) WITH PROPOFOL N/A 10/01/2021   Procedure: ESOPHAGOGASTRODUODENOSCOPY (EGD) WITH PROPOFOL;  Surgeon: Lanelle Bal, DO;  [facility];  Service: Endoscopy;  Laterality: N/A;  3:00pm   EXCISION OF MESH  08/2019   HEMORRHOID SURGERY N/A 06/18/2017   Procedure: THREE COLUMN EXTENSIVE HEMORRHOIDECTOMY;  Surgeon: Lucretia Roers, MD;  Location: [facility];  Service: General;  Laterality: N/A;   HERNIA REPAIR      INCISION AND DRAINAGE ABSCESS N/A 01/06/2017   Procedure: INCISION AND DRAINAGE ABDOMINAL WALL ABSCESS;  Surgeon: Franky Macho, MD;  Location: [facility];  Service: General;  Laterality: N/A;   incisional hernial repair  09/05/2021   [place]   LEFT HEART CATH AND CORONARY ANGIOGRAPHY N/A 05/09/2020   Procedure: LEFT HEART CATH AND CORONARY ANGIOGRAPHY;  Surgeon: Elder Negus, MD;  [facility];  Service: Cardiovascular;  Laterality: N/A;   POLYPECTOMY  12/24/2018   Procedure: POLYPECTOMY;  Surgeon: Corbin Ade, MD;  [facility];  Service: Endoscopy;;  colon   tendon repar Right    wrist   WOUND EXPLORATION Right 06/24/2013   Procedure: exploration of traumatic wound right wrist;  Surgeon: Tami Ribas, MD;  Location: [facility];  Service: Orthopedics;  Laterality: Right;   Social History:  reports that she has been smoking cigarettes. She has a 8.8 pack-year smoking history. She has been exposed to tobacco smoke. She has never used smokeless tobacco. She reports that she does not currently use alcohol. She reports that she does not currently use drugs after having  used the following drugs: Cocaine.  Allergies  Allergen Reactions   Metoclopramide Other (See Comments) and Anaphylaxis    EPS symptoms  Jerking movements   Codeine Hives   Reglan [Metoclopramide] Other (See Comments)    EPS symptoms     Family History  Problem Relation Age of Onset   Drug abuse Mother    Lung cancer Father    Cancer Father        mets   Drug abuse Sister    Drug abuse Brother    Breast cancer Maternal Aunt    Bipolar disorder Maternal Aunt    Drug abuse Maternal Aunt    Colon cancer Maternal Grandmother        late 70s, early 42s   Bipolar disorder Paternal Grandfather    Brain cancer Son    Schizophrenia Son    Cancer Son        brain   Bipolar disorder Cousin    Liver disease Neg Hx     Prior to Admission medications   Medication Sig Start Date End Date  Taking? Authorizing Provider  amLODipine (NORVASC) 10 MG tablet Take 10 mg by mouth daily. 05/18/21   [provider]  aspirin EC 81 MG EC tablet Take 1 tablet (81 mg total) by mouth daily. 05/19/19   Elgergawy, Leana Roe, MD  atenolol (TENORMIN) 50 MG tablet Take 50 mg by mouth daily. 05/18/21   [provider]  baclofen (LIORESAL) 10 MG tablet Take 1 tablet 3 times a day by oral route.    [provider]  Cholecalciferol 50 MCG (2000 UT) CAPS Take 1 tablet by mouth daily.    [provider]  clindamycin (CLEOCIN) 300 MG capsule Take 300 mg by mouth 2 (two) times daily. 02/06/23   [provider]  clopidogrel (PLAVIX) 75 MG tablet Take 75 mg by mouth daily. 05/25/22   [provider]  cyclobenzaprine (FLEXERIL) 10 MG tablet Take 10 mg by mouth every 8 (eight) hours as needed. 02/06/23   [provider]  dicyclomine (BENTYL) 10 MG capsule Take by mouth. 12/09/18   [provider]  dicyclomine (BENTYL) 20 MG tablet Take 20 mg by mouth every 6 (six) hours as needed. 10/24/22   [provider]  diltiazem (CARDIZEM CD) 240 MG 24 hr capsule Take 1 capsule every day by oral route.    [provider]  Docusate Sodium (DSS) 100 MG CAPS Take 1 capsule by mouth 2 (two) times daily.    [provider]  DULoxetine (CYMBALTA) 30 MG capsule Take 30 mg by mouth 2 (two) times daily. 08/14/21   [provider]  escitalopram (LEXAPRO) 20 MG tablet Take 1 tablet by mouth daily.    [provider]  esomeprazole (NEXIUM) 40 MG capsule Take 1 capsule (40 mg total) by mouth 2 (two) times daily before a meal. Patient taking differently: Take 40 mg by mouth daily before breakfast. 09/14/21   Letta Median, PA-C  gabapentin (NEURONTIN) 100 MG capsule Take 100 mg by mouth 3 (three) times daily. 03/03/23   [provider]  HYDROcodone-acetaminophen (NORCO) 5-325 MG tablet Take 1-2 tablets by mouth every 6 (six)  hours as needed. 02/21/23   Geoffery Lyons, MD  hydroxychloroquine (PLAQUENIL) 200 MG tablet Take 1.5 tablets (300 mg total) by mouth daily. 10/31/22   Fuller Plan, MD  ibuprofen (ADVIL) 800 MG tablet Take 1 tablet by mouth 2 (two) times daily as needed.  [provider]  isosorbide mononitrate (IMDUR) 60 MG 24 hr tablet TAKE 1/2 TABLET BY MOUTH DAILY 06/25/21   Patwardhan, Manish J, MD  levocetirizine (XYZAL) 5 MG tablet Take 1 tablet by mouth daily.    [provider]  lisinopril-hydrochlorothiazide (ZESTORETIC) 20-12.5 MG tablet Take 1 tablet by mouth every morning. 10/31/20   [provider]  losartan-hydrochlorothiazide (HYZAAR) 100-25 MG tablet Take 1 tablet by mouth daily. 03/14/23   [provider]  methocarbamol (ROBAXIN) 500 MG tablet Take 500 mg by mouth 2 (two) times daily. 10/31/21   [provider]  metoprolol tartrate (LOPRESSOR) 50 MG tablet Take by mouth. 01/06/20   [provider]  mirtazapine (REMERON) 15 MG tablet Take 15 mg by mouth at bedtime as needed. 08/10/21   [provider]  nitroGLYCERIN (NITROSTAT) 0.4 MG SL tablet Place 1 tablet (0.4 mg total) under the tongue every 5 (five) minutes as needed for chest pain. 12/29/19 09/21/21  Patwardhan, Anabel Bene, MD  oxyCODONE-acetaminophen (PERCOCET/ROXICET) 5-325 MG tablet Take 1 tablet by mouth every 6 (six) hours as needed for severe pain. 05/16/22   Bethann Berkshire, MD  pantoprazole (PROTONIX) 40 MG tablet Take 1 tablet by mouth daily.    [provider]  predniSONE (DELTASONE) 5 MG tablet Take by mouth. 02/06/23   [provider]  prochlorperazine (COMPAZINE) 10 MG tablet     [provider]  promethazine (PHENERGAN) 12.5 MG tablet Take 12.5 mg by mouth every 6 (six) hours as needed for nausea or vomiting.  12/18/19   [provider]  Rimegepant Sulfate (NURTEC) 75 MG TBDP Take 75 mg by mouth as needed. 09/12/21   Ocie Doyne, MD   rosuvastatin (CRESTOR) 20 MG tablet Take 1 tablet (20 mg total) by mouth daily. 06/25/19 11/06/28  Patwardhan, Anabel Bene, MD  SUMAtriptan (IMITREX) 50 MG tablet TAKE 1 TABLET BY MOUTH EVERY 2 HOURS AS NEEDED FOR MIGRAINES MAY REPEAT IN 2 HOURS    [provider]  thiamine 100 MG tablet Take 100 mg by mouth daily. 05/18/21   [provider]  tiZANidine (ZANAFLEX) 2 MG tablet Take 2 mg by mouth at bedtime. 03/03/23   [provider]  zolpidem (AMBIEN CR) 12.5 MG CR tablet Take by mouth. 08/03/13   [provider]    Physical Exam: Vitals:   03/14/23 1237 03/14/23 1239 03/14/23 1330 03/14/23 1738  BP:  (!) 170/105 (!) 136/108   Pulse: 71 73 66   Resp:      Temp:    98 F (36.7 C)  TempSrc:    Oral  SpO2: 97% 96% 95%   Weight:      Height:       General: Middle-age female. Awake and alert and oriented x3. No acute cardiopulmonary distress.  HEENT: Normocephalic atraumatic.  Right and left ears normal in appearance.  Pupils equal, round, reactive to light. Extraocular muscles are intact. Sclerae anicteric and noninjected.  Moist mucosal membranes. No mucosal lesions.  Neck: Neck supple without lymphadenopathy. No carotid bruits. No masses palpated.  Cardiovascular: Regular rate with normal S1-S2 sounds. No murmurs, rubs, gallops auscultated. No JVD.  Respiratory: Good respiratory effort with no wheezes, rales, rhonchi. Lungs clear to auscultation bilaterally.  No accessory muscle use. Abdomen: Multiple scars on abdomen related to her multiple surgeries.  Soft, nontender, nondistended. Active bowel sounds. No masses or hepatosplenomegaly  Skin: No rashes, lesions, or ulcerations.  Dry, warm to touch. 2+ dorsalis pedis and radial pulses. Musculoskeletal:  No calf or leg pain. All major joints not erythematous nontender.  No upper or lower joint deformation.  Good ROM.  No contractures  Psychiatric: Intact judgment and insight. Pleasant and cooperative. Neurologic:  Left upper and lower extremities globally weak.  Strength 3 out of 5 in all muscle groups.  No cranial nerve deficits.  Sensation diminished in the left lower leg and right upper extremity.  Data Reviewed: Results for orders placed or performed during the hospital encounter of 03/14/23 (from the past 24 hours)  CBC     Status: Abnormal   Collection Time: 03/14/23  1:01 PM  Result Value Ref Range   WBC 6.4 4.0 - 10.5 K/uL   RBC 4.39 3.87 - 5.11 MIL/uL   Hemoglobin 11.9 (L) 12.0 - 15.0 g/dL   HCT 95.2 (L) 84.1 - 32.4 %   MCV 80.2 80.0 - 100.0 fL   MCH 27.1 26.0 - 34.0 pg   MCHC 33.8 30.0 - 36.0 g/dL   RDW 40.1 (H) 02.7 - 25.3 %   Platelets 400 150 - 400 K/uL   nRBC 0.0 0.0 - 0.2 %  Comprehensive metabolic panel     Status: Abnormal   Collection Time: 03/14/23  1:01 PM  Result Value Ref Range   Sodium 139 135 - 145 mmol/L   Potassium 2.7 (LL) 3.5 - 5.1 mmol/L   Chloride 104 98 - 111 mmol/L   CO2 27 22 - 32 mmol/L   Glucose, Bld 95 70 - 99 mg/dL   BUN 8 6 - 20 mg/dL   Creatinine, Ser 6.64 0.44 - 1.00 mg/dL   Calcium 9.3 8.9 - 40.3 mg/dL   Total Protein 7.0 6.5 - 8.1 g/dL   Albumin 3.7 3.5 - 5.0 g/dL   AST 19 15 - 41 U/L   ALT 23 0 - 44 U/L   Alkaline Phosphatase 67 38 - 126 U/L   Total Bilirubin 0.7 <1.2 mg/dL   GFR, Estimated >47 >42 mL/min   Anion gap 8 5 - 15  Magnesium     Status: None   Collection Time: 03/14/23  1:01 PM  Result Value Ref Range   Magnesium 2.0 1.7 - 2.4 mg/dL    CT Angio Head Neck W WO CM Result Date: 03/14/2023 CLINICAL DATA:  Neuro deficit, acute, stroke suspected. Headache for 3 days. A left-sided weakness beginning at 6 o'clock p.m. yesterday. EXAM: CT ANGIOGRAPHY HEAD AND NECK WITH AND WITHOUT CONTRAST TECHNIQUE: Multidetector CT imaging of the head and neck was performed using the standard protocol during bolus administration of intravenous contrast. Multiplanar CT image reconstructions and MIPs were obtained to evaluate the vascular anatomy.  Carotid stenosis measurements (when applicable) are obtained utilizing NASCET criteria, using the distal internal carotid diameter as the denominator. RADIATION DOSE REDUCTION: This exam was performed according to the departmental dose-optimization program which includes automated exposure control, adjustment of the mA and/or kV according to patient size and/or use of iterative reconstruction technique. CONTRAST:  75mL OMNIPAQUE IOHEXOL 350 MG/ML SOLN COMPARISON:  MR head without and with contrast and MR angio head 07/13/2021 FINDINGS: CT HEAD FINDINGS Brain: No acute infarct, hemorrhage, or mass lesion is present. No significant white matter lesions are present. Deep brain nuclei are within normal limits. The brainstem and cerebellum are within normal limits. A somewhat dilated relatively empty sella is stable. Midline structures are otherwise unremarkable. Vascular: No hyperdense vessel or unexpected calcification. Skull: Calvarium is intact. No focal lytic or blastic lesions are present. No significant extracranial  soft tissue lesion is present. Sinuses/Orbits: The paranasal sinuses and mastoid air cells are clear. The globes and orbits are within normal limits. Review of the MIP images confirms the above findings CTA NECK FINDINGS Aortic arch: A common origin of the left common carotid artery and innominate artery is noted. Minimal calcifications are present at the arch without aneurysm or stenosis. No dissection is present. Right carotid system: Right common carotid artery is within normal limits. The bifurcation is unremarkable. The cervical right ICA is normal. Left carotid system: The left common carotid artery is within normal limits. Minimal atherosclerotic change is present at the bifurcation without significant stenosis. The cervical left ICA is otherwise normal. Vertebral arteries: The vertebral arteries are codominant. Both vertebral arteries originate from the subclavian arteries without significant  stenosis. No significant stenosis is present in either vertebral artery in the neck. Skeleton: Degenerative changes are present lower cervical spine, most evident at C5-6 a calcified central disc protrusion. Straightening of the normal cervical lordosis is present. No focal osseous lesions are present. Other neck: Soft tissues the neck are otherwise unremarkable. Salivary glands are within normal limits. Thyroid is normal. No significant adenopathy is present. No focal mucosal or submucosal lesions are present. Upper chest: Mild ground-glass attenuation is present in the left upper lobe without significant airspace consolidation. No nodule or mass lesion is present. The thoracic inlet is within limits. Review of the MIP images confirms the above findings CTA HEAD FINDINGS Anterior circulation: Mild atherosclerotic calcifications are present within the cavernous internal carotid arteries bilaterally. No significant stenosis is present through the ICA termini. The A1 and M1 segments are normal. The anterior communicating arteries patent. The MCA bifurcations are normal bilaterally. The ACA and MCA branch vessels are within normal limits. No aneurysm is present. Posterior circulation: PICA origins are visualized and normal. The vertebrobasilar junction and basilar artery are normal. The superior cerebellar arteries originate from basilar tip. The left posterior cerebral artery originates from basilar tip. Right posterior cerebral artery is fed by the posterior communicating artery and P1 segment. The PCA branch vessels are normal bilaterally. No aneurysm is present. Venous sinuses: The dural sinuses are patent. The straight sinus and deep cerebral veins are intact. Cortical veins are within normal limits. No significant vascular malformation is evident. Anatomic variants: None Review of the MIP images confirms the above findings IMPRESSION: 1. Normal noncontrast CT of the head. 2. Stable somewhat dilated relatively  empty sella. This is nonspecific, but can be seen in the setting of idiopathic intracranial hypertension. 3. Minimal atherosclerotic change at the left carotid bifurcation and cavernous internal carotid arteries bilaterally without significant stenosis. 4. No significant proximal stenosis, aneurysm, or branch vessel occlusion within the Circle of Willis. 5. Degenerative changes of the lower cervical spine, most evident at C5-6 with a calcified central disc protrusion. 6. Mild ground-glass attenuation in the left upper lobe without significant airspace consolidation. This may represent atelectasis or developing pneumonia. Electronically Signed   By: Marin Roberts M.D.   On: 03/14/2023 16:47     Assessment and Plan: No notes have been filed under this hospital service. Service: Hospitalist  Principal Problem:   Left-sided weakness Active Problems:   Hypothyroid   SLE (systemic lupus erythematosus) (HCC)   Essential hypertension, benign   Paroxysmal atrial fibrillation (HCC)    Left-sided weakness There is history of stroke, this would be concerning for another stroke.  CT and CTA of head and neck were negative. Check MRI Continue aspirin/Plavix Echocardiogram PT/OT/speech  therapy evaluation Lipid panel in the morning Hold antihypertensives and treat for SBP greater than 180 History of strokes On aspirin and Plavix Hypothyroidism Does not appear to be on levothyroxine Check TSH SLE On Plaquenil and steroids which we will continue Hypertension Hold antihypertensives for the moment History of paroxysmal atrial fibrillation Just on aspirin.  Heart rate controlled Will check echocardiogram   Advance Care Planning:   Code Status: Full Code confirmed by patient  Consults: Teleneurology  Family Communication: Boyfriend present  Severity of Illness: The appropriate patient status for this patient is INPATIENT. Inpatient status is judged to be reasonable and necessary in order  to provide the required intensity of service to ensure the patient's safety. The patient's presenting symptoms, physical exam findings, and initial radiographic and laboratory data in the context of their chronic comorbidities is felt to place them at high risk for further clinical deterioration. Furthermore, it is not anticipated that the patient will be medically stable for discharge from the hospital within 2 midnights of admission.   * I certify that at the point of admission it is my clinical judgment that the patient will require inpatient hospital care spanning beyond 2 midnights from the point of admission due to high intensity of service, high risk for further deterioration and high frequency of surveillance required.*  Author: Levie Heritage, DO 03/14/2023 5:59 PM  For on call review [email or web address].

## 2023-03-14 NOTE — ED Triage Notes (Addendum)
C/o HA x 3 days, yesterday left sided weakness since 1800, tingling to left fingers. Hx of CVA

## 2023-03-14 NOTE — ED Notes (Signed)
Pt is a hard stick, nursing staff will attempt an [place] IV

## 2023-03-14 NOTE — ED Provider Notes (Signed)
[place] EMERGENCY DEPARTMENT AT [facility] Provider Note   CSN: [id number] Arrival date & time: 03/14/23  1046     History  Chief Complaint  Patient presents with   Headache    Angel Price is a 57 y.o. female.  Pt indicates since approximately 6 pm last night, w headache, left arm > left leg numbness/weakness, and some blurry vision. Denies syncope, trauma or fall. No vomiting. Denies change in speech. Has been able to walk. Denies neck, back or radicular pain. No chest pain or sob. Indicates compliant w normal meds including her bp meds which she took today.   The history is provided by the patient and medical records.  Headache Associated symptoms: numbness and weakness   Associated symptoms: no abdominal pain, no back pain, no congestion, no cough, no eye pain, no fever, no nausea, no neck pain, no photophobia, no sinus pressure, no sore throat and no vomiting        Home Medications Prior to Admission medications   Medication Sig Start Date End Date Taking? Authorizing Provider  amLODipine (NORVASC) 10 MG tablet Take 10 mg by mouth daily. 05/18/21   [provider]  aspirin EC 81 MG EC tablet Take 1 tablet (81 mg total) by mouth daily. 05/19/19   Elgergawy, Leana Roe, MD  atenolol (TENORMIN) 50 MG tablet Take 50 mg by mouth daily. 05/18/21   [provider]  baclofen (LIORESAL) 10 MG tablet Take 1 tablet 3 times a day by oral route.    [provider]  Cholecalciferol 50 MCG (2000 UT) CAPS Take 1 tablet by mouth daily.    [provider]  clopidogrel (PLAVIX) 75 MG tablet Take 75 mg by mouth daily. 05/25/22   [provider]  dicyclomine (BENTYL) 10 MG capsule Take by mouth. 12/09/18   [provider]  diltiazem (CARDIZEM CD) 240 MG 24 hr capsule Take 1 capsule every day by oral route.    [provider]  Docusate Sodium (DSS) 100 MG CAPS Take 1 capsule by mouth 2 (two) times daily.    [provider]  DULoxetine (CYMBALTA) 30 MG capsule Take 30 mg by mouth 2 (two) times daily. 08/14/21   [provider]  escitalopram (LEXAPRO) 20 MG tablet Take 1 tablet by mouth daily.    [provider]  esomeprazole (NEXIUM) 40 MG capsule Take 1 capsule (40 mg total) by mouth 2 (two) times daily before a meal. Patient taking differently: Take 40 mg by mouth daily before breakfast. 09/14/21   Letta Median, PA-C  HYDROcodone-acetaminophen (NORCO) 5-325 MG tablet Take 1-2 tablets by mouth every 6 (six) hours as needed. 02/21/23   Geoffery Lyons, MD  hydroxychloroquine (PLAQUENIL) 200 MG tablet Take 1.5 tablets (300 mg total) by mouth daily. 10/31/22   Fuller Plan, MD  ibuprofen (ADVIL) 800 MG tablet Take 1 tablet by mouth 2 (two) times daily as needed.    [provider]  isosorbide mononitrate (IMDUR) 60 MG 24 hr tablet TAKE 1/2 TABLET BY MOUTH DAILY 06/25/21   Patwardhan, Manish J, MD  levocetirizine (XYZAL) 5 MG tablet Take 1 tablet by mouth daily.    [provider]  lisinopril-hydrochlorothiazide (ZESTORETIC) 20-12.5 MG tablet Take 1 tablet by mouth every morning. 10/31/20   [provider]  methocarbamol (ROBAXIN) 500 MG tablet Take 500 mg by mouth 2 (two) times daily. 10/31/21   [provider]  metoprolol tartrate (LOPRESSOR) 50 MG tablet Take by  mouth. 01/06/20   [provider]  mirtazapine (REMERON) 15 MG tablet Take 15 mg by mouth at bedtime as needed. 08/10/21   [provider]  nitroGLYCERIN (NITROSTAT) 0.4 MG SL tablet Place 1 tablet (0.4 mg total) under the tongue every 5 (five) minutes as needed for chest pain. 12/29/19 09/21/21  Patwardhan, Anabel Bene, MD  oxyCODONE (OXY IR/ROXICODONE) 5 MG immediate release tablet Take by mouth. Patient not taking: Reported on 10/31/2022 01/03/22   [provider]  oxyCODONE-acetaminophen (PERCOCET/ROXICET) 5-325 MG tablet Take 1 tablet by mouth every 6 (six) hours as  needed for severe pain. 05/16/22   Bethann Berkshire, MD  pantoprazole (PROTONIX) 40 MG tablet Take 1 tablet by mouth daily.    [provider]  prochlorperazine (COMPAZINE) 10 MG tablet     [provider]  promethazine (PHENERGAN) 12.5 MG tablet Take 12.5 mg by mouth every 6 (six) hours as needed for nausea or vomiting.  12/18/19   [provider]  Rimegepant Sulfate (NURTEC) 75 MG TBDP Take 75 mg by mouth as needed. 09/12/21   Ocie Doyne, MD  rosuvastatin (CRESTOR) 20 MG tablet Take 1 tablet (20 mg total) by mouth daily. 06/25/19 11/06/28  Patwardhan, Anabel Bene, MD  SUMAtriptan (IMITREX) 50 MG tablet TAKE 1 TABLET BY MOUTH EVERY 2 HOURS AS NEEDED FOR MIGRAINES MAY REPEAT IN 2 HOURS    [provider]  thiamine (VITAMIN B1) 100 MG tablet Take 100 mg by mouth daily. 08/09/22   [provider]  thiamine 100 MG tablet Take 100 mg by mouth daily. 05/18/21   [provider]  zolpidem (AMBIEN CR) 12.5 MG CR tablet Take by mouth. 08/03/13   [provider]      Allergies    Metoclopramide, Codeine, and Reglan [metoclopramide]    Review of Systems   Review of Systems  Constitutional:  Negative for chills and fever.  HENT:  Negative for congestion, sinus pressure and sore throat.   Eyes:  Positive for visual disturbance. Negative for photophobia, pain and redness.  Respiratory:  Negative for cough and shortness of breath.   Cardiovascular:  Negative for chest pain.  Gastrointestinal:  Negative for abdominal pain, nausea and vomiting.  Genitourinary:  Negative for flank pain.  Musculoskeletal:  Negative for back pain and neck pain.  Skin:  Negative for rash.  Neurological:  Positive for weakness, numbness and headaches. Negative for speech difficulty.  Hematological:  Does not bruise/bleed easily.  Psychiatric/Behavioral:  Negative for confusion.     Physical Exam Updated Vital Signs BP (!) 136/108   Pulse 66   Temp 98.2 F (36.8 C)  (Oral)   Resp (!) 28   Ht 1.6 m (5\' 3" )   Wt 65.8 kg   LMP  (LMP Unknown)   SpO2 95%   BMI 25.69 kg/m  Physical Exam Vitals and nursing note reviewed.  Constitutional:      Appearance: Normal appearance. She is well-developed.  HENT:     Head: Atraumatic.     Comments: No sinus or temporal  tenderness.     Nose: Nose normal.     Mouth/Throat:     Mouth: Mucous membranes are moist.  Eyes:     General: No scleral icterus.    Conjunctiva/sclera: Conjunctivae normal.     Pupils: Pupils are equal, round, and reactive to light.  Neck:     Vascular: No carotid bruit.     Trachea: No tracheal deviation.     Comments: No  stiffness or rigidity.  Cardiovascular:     Rate and Rhythm: Normal rate and regular rhythm.     Pulses: Normal pulses.     Heart sounds: Normal heart sounds. No murmur heard.    No friction rub. No gallop.  Pulmonary:     Effort: Pulmonary effort is normal. No respiratory distress.     Breath sounds: Normal breath sounds.  Abdominal:     General: Bowel sounds are normal. There is no distension.     Palpations: Abdomen is soft.     Tenderness: There is no abdominal tenderness. There is no guarding.  Genitourinary:    Comments: No cva tenderness.  Musculoskeletal:        General: No swelling or tenderness.     Cervical back: Normal range of motion and neck supple. No rigidity or tenderness. No muscular tenderness.  Skin:    General: Skin is warm and dry.     Findings: No rash.  Neurological:     Mental Status: She is alert.     Comments: Alert, speech normal. No dysarthria or aphasia noted. Left sided weakness 4/5. Left pronator drift. No neglect. Sensation to pressure/touch grossly intact. Pt indicates vision with left eye is diffusely blurry, no amaurosis or visual field cut. Indicates can see hand and face with left eye but diffusely blurry, not able to count fingers or read. Right eye, able to count fingers, see clock and writing.   Psychiatric:         Mood and Affect: Mood normal.     ED Results / Procedures / Treatments   Labs (all labs ordered are listed, but only abnormal results are displayed) Results for orders placed or performed during the hospital encounter of 03/14/23  CBC   Collection Time: 03/14/23  1:01 PM  Result Value Ref Range   WBC 6.4 4.0 - 10.5 K/uL   RBC 4.39 3.87 - 5.11 MIL/uL   Hemoglobin 11.9 (L) 12.0 - 15.0 g/dL   HCT 16.1 (L) 09.6 - 04.5 %   MCV 80.2 80.0 - 100.0 fL   MCH 27.1 26.0 - 34.0 pg   MCHC 33.8 30.0 - 36.0 g/dL   RDW 40.9 (H) 81.1 - 91.4 %   Platelets 400 150 - 400 K/uL   nRBC 0.0 0.0 - 0.2 %  Comprehensive metabolic panel   Collection Time: 03/14/23  1:01 PM  Result Value Ref Range   Sodium 139 135 - 145 mmol/L   Potassium 2.7 (LL) 3.5 - 5.1 mmol/L   Chloride 104 98 - 111 mmol/L   CO2 27 22 - 32 mmol/L   Glucose, Bld 95 70 - 99 mg/dL   BUN 8 6 - 20 mg/dL   Creatinine, Ser 7.82 0.44 - 1.00 mg/dL   Calcium 9.3 8.9 - 95.6 mg/dL   Total Protein 7.0 6.5 - 8.1 g/dL   Albumin 3.7 3.5 - 5.0 g/dL   AST 19 15 - 41 U/L   ALT 23 0 - 44 U/L   Alkaline Phosphatase 67 38 - 126 U/L   Total Bilirubin 0.7 <1.2 mg/dL   GFR, Estimated >21 >30 mL/min   Anion gap 8 5 - 15  Magnesium   Collection Time: 03/14/23  1:01 PM  Result Value Ref Range   Magnesium 2.0 1.7 - 2.4 mg/dL   CT ABDOMEN PELVIS WO CONTRAST Result Date: 02/21/2023 CLINICAL DATA:  Right upper quadrant abdominal pain. EXAM: CT ABDOMEN AND PELVIS WITHOUT CONTRAST TECHNIQUE: Multidetector CT imaging of the abdomen  and pelvis was performed following the standard protocol without IV contrast. RADIATION DOSE REDUCTION: This exam was performed according to the departmental dose-optimization program which includes automated exposure control, adjustment of the mA and/or kV according to patient size and/or use of iterative reconstruction technique. COMPARISON:  October 05, 2021 FINDINGS: Lower chest: No acute abnormality. Hepatobiliary: No focal liver  abnormality is seen. Status post cholecystectomy. No biliary dilatation. Pancreas: Unremarkable. No pancreatic ductal dilatation or surrounding inflammatory changes. Spleen: Normal in size without focal abnormality. Adrenals/Urinary Tract: A 1.7 cm x 1.5 cm low-attenuation left adrenal mass is noted (approximately -5.92 Hounsfield units). The right adrenal gland is surgically absent. Kidneys are normal, without renal calculi, focal lesion, or hydronephrosis. Bladder is unremarkable. Stomach/Bowel: Surgical sutures are seen within the gastric region. The appendix is not identified. Surgically anastomosed bowel is noted within the anterior aspect of the mid left abdomen and mid to lower right abdomen. No evidence of bowel wall thickening, distention, or inflammatory changes. Vascular/Lymphatic: Aortic atherosclerosis. No enlarged abdominal or pelvic lymph nodes. Reproductive: Status post hysterectomy. No adnexal masses. Other: A surgical scar seen along the anterior abdominal wall. No abdominopelvic ascites. Musculoskeletal: No acute or significant osseous findings. IMPRESSION: 1. Evidence of prior cholecystectomy, right adrenalectomy and bowel surgery. 2. Low-attenuation left adrenal mass, likely consistent with an adrenal adenoma. No follow-up imaging is recommended. This recommendation follows ACR consensus guidelines: Management of Incidental Adrenal Masses: A White Paper of the ACR Incidental Findings Committee. J Am Coll Radiol 2017;14:1038-1044. 3. Aortic atherosclerosis. Aortic Atherosclerosis (ICD10-I70.0). Electronically Signed   By: Aram Candela M.D.   On: 02/21/2023 02:33     EKG EKG Interpretation Date/Time:  Friday March 14 2023 11:07:20 EST Ventricular Rate:  83 PR Interval:  134 QRS Duration:  76 QT Interval:  392 QTC Calculation: 460 R Axis:   49  Text Interpretation: Normal sinus rhythm Nonspecific ST and T wave abnormality Confirmed by Cathren Laine ([id number]) on 03/14/2023  12:20:00 PM  Radiology No results found.  Procedures Procedures    Medications Ordered in ED Medications  potassium chloride 10 mEq in 100 mL IVPB (10 mEq Intravenous New Bag/Given 03/14/23 1554)  iohexol (OMNIPAQUE) 350 MG/ML injection 75 mL (75 mLs Intravenous Contrast Given 03/14/23 1603)    ED Course/ Medical Decision Making/ A&P                                 Medical Decision Making Problems Addressed: Generalized headache: acute illness or injury Hypokalemia: acute illness or injury Left sided numbness: acute illness or injury with systemic symptoms that poses a threat to life or bodily functions Left-sided weakness: acute illness or injury with systemic symptoms that poses a threat to life or bodily functions Uncontrolled hypertension: acute illness or injury with systemic symptoms that poses a threat to life or bodily functions  Amount and/or Complexity of Data Reviewed External Data Reviewed: notes. Labs: ordered. Decision-making details documented in ED Course. Radiology: ordered and independent interpretation performed. Decision-making details documented in ED Course. ECG/medicine tests: ordered and independent interpretation performed. Decision-making details documented in ED Course.  Risk Prescription drug management. Decision regarding hospitalization.   Iv ns. Continuous pulse ox and cardiac monitoring. Labs ordered/sent. Imaging ordered.   Differential diagnosis includes cva, migraine, hypertensive urgency, etc. Dispo decision including potential need for admission considered - will get labs and imaging and reassess.   Reviewed nursing notes and prior charts for additional  history. External reports reviewed.   Neurology consulted. Discussed pt, symptoms incl blurry vision, with Dr Amada Jupiter - he indicates with onset yesterday and visual symptom being blurry vision, would not qualify for acute intervention, but to get CT/CTA, and medicine admission cva  workup.  Bp is improved from prior. Headache is improved.   Cardiac monitor: sinus rhythm, rate 80.  Labs reviewed/interpreted by me - k low. Kcl runs iv. Mg added. Wbc normal. Hct 35. Cr normal.   CT head reviewed/interpreted by me - pnd.   1610, signed out to Dr Charm Barges, to check ct/cta, and facilitate admission.             Final Clinical Impression(s) / ED Diagnoses Final diagnoses:  Neurocognitive deficits    Rx / DC Orders ED Discharge Orders     None         Cathren Laine, MD 03/14/23 1615

## 2023-03-14 NOTE — ED Provider Notes (Signed)
Signout from Dr. West Bali.  57 year old female complaining of headache left arm and leg numbness weakness blurry vision that started last evening.  Still has some subtle weakness today.  Blood pressure elevated but improving.  Patient is pending CT angio head and neck.  Anticipate will need admission for further neurologic workup. Physical Exam  BP (!) 136/108   Pulse 66   Temp 98.2 F (36.8 C) (Oral)   Resp (!) 28   Ht 5\' 3"  (1.6 m)   Wt 65.8 kg   LMP  (LMP Unknown)   SpO2 95%   BMI 25.69 kg/m   Physical Exam  Procedures  Procedures  ED Course / MDM    Medical Decision Making Amount and/or Complexity of Data Reviewed Labs: ordered. Radiology: ordered.  Risk Prescription drug management.   CT angio does not show stroke or significant LVO.  She is complaining of headache.  Still having some weakness and numbness on the left side.  Will review with hospitalist regarding admission.  1715.  Discussed with [facility] hospitalist Dr. Adrian Blackwater who will evaluate patient for admission     Terrilee Files, MD 03/15/23 1234

## 2023-03-15 ENCOUNTER — Inpatient Hospital Stay (HOSPITAL_COMMUNITY): Payer: Medicare Other

## 2023-03-15 DIAGNOSIS — R531 Weakness: Secondary | ICD-10-CM | POA: Diagnosis not present

## 2023-03-15 DIAGNOSIS — I10 Essential (primary) hypertension: Secondary | ICD-10-CM

## 2023-03-15 LAB — ECHOCARDIOGRAM COMPLETE
Area-P 1/2: 3.88 cm2
Calc EF: 59.5 %
Height: 63 in
S' Lateral: 3 cm
Single Plane A2C EF: 66.8 %
Single Plane A4C EF: 61.5 %
Weight: 2320 [oz_av]

## 2023-03-15 LAB — LIPID PANEL
Cholesterol: 162 mg/dL (ref 0–200)
HDL: 44 mg/dL (ref >40)
LDL Cholesterol: 99 mg/dL (ref 0–99)
Total CHOL/HDL Ratio: 3.7 {ratio}
Triglycerides: 97 mg/dL (ref <150)
VLDL: 19 mg/dL (ref 0–40)

## 2023-03-15 LAB — CBC
HCT: 31.4 % — ABNORMAL LOW (ref 36.0–46.0)
Hemoglobin: 11.2 g/dL — ABNORMAL LOW (ref 12.0–15.0)
MCH: 28.3 pg (ref 26.0–34.0)
MCHC: 35.7 g/dL (ref 30.0–36.0)
MCV: 79.3 fL — ABNORMAL LOW (ref 80.0–100.0)
Platelets: 348 10*3/uL (ref 150–400)
RBC: 3.96 MIL/uL (ref 3.87–5.11)
RDW: 17.8 % — ABNORMAL HIGH (ref 11.5–15.5)
WBC: 3.2 10*3/uL — ABNORMAL LOW (ref 4.0–10.5)
nRBC: 0 % (ref 0.0–0.2)

## 2023-03-15 LAB — BASIC METABOLIC PANEL
Anion gap: 5 (ref 5–15)
BUN: <5 mg/dL — ABNORMAL LOW (ref 6–20)
CO2: 22 mmol/L (ref 22–32)
Calcium: 8.3 mg/dL — ABNORMAL LOW (ref 8.9–10.3)
Chloride: 109 mmol/L (ref 98–111)
Creatinine, Ser: 0.4 mg/dL — ABNORMAL LOW (ref 0.44–1.00)
GFR, Estimated: >60 mL/min (ref >60)
Glucose, Bld: 97 mg/dL (ref 70–99)
Potassium: 2.9 mmol/L — ABNORMAL LOW (ref 3.5–5.1)
Sodium: 136 mmol/L (ref 135–145)

## 2023-03-15 LAB — MAGNESIUM: Magnesium: 1.8 mg/dL (ref 1.7–2.4)

## 2023-03-15 LAB — HIV ANTIBODY (ROUTINE TESTING W REFLEX): HIV Screen 4th Generation wRfx: NONREACTIVE

## 2023-03-15 LAB — TSH: TSH: 0.903 u[IU]/mL (ref 0.350–4.500)

## 2023-03-15 MED ORDER — POTASSIUM CHLORIDE 10 MEQ/100ML IV SOLN
10.0000 meq | INTRAVENOUS | Status: AC
  Administered 2023-03-15 (×4): 10 meq via INTRAVENOUS
  Filled 2023-03-15 (×4): qty 100

## 2023-03-15 MED ORDER — DILTIAZEM HCL ER COATED BEADS 120 MG PO CP24: 240.0000 mg | ORAL_CAPSULE | Freq: Every day | ORAL | Status: DC

## 2023-03-15 MED ORDER — ATENOLOL 25 MG PO TABS
50.0000 mg | ORAL_TABLET | Freq: Every day | ORAL | Status: DC
  Administered 2023-03-15 – 2023-03-16 (×2): 50 mg via ORAL
  Filled 2023-03-15 (×2): qty 2

## 2023-03-15 MED ORDER — LORAZEPAM 2 MG/ML IJ SOLN
1.0000 mg | Freq: Once | INTRAMUSCULAR | Status: AC
  Administered 2023-03-15: 1 mg via INTRAVENOUS
  Filled 2023-03-15: qty 1

## 2023-03-15 MED ORDER — AMLODIPINE BESYLATE 5 MG PO TABS
10.0000 mg | ORAL_TABLET | Freq: Every day | ORAL | Status: DC
  Administered 2023-03-15 – 2023-03-16 (×2): 10 mg via ORAL
  Filled 2023-03-15 (×2): qty 2

## 2023-03-15 MED ORDER — POTASSIUM CHLORIDE CRYS ER 20 MEQ PO TBCR
40.0000 meq | EXTENDED_RELEASE_TABLET | Freq: Two times a day (BID) | ORAL | Status: AC
  Administered 2023-03-15 (×2): 40 meq via ORAL
  Filled 2023-03-15 (×2): qty 2

## 2023-03-15 NOTE — Progress Notes (Signed)
2D echo attempted, patient in chair, Will try later

## 2023-03-15 NOTE — Plan of Care (Signed)
  Problem: Acute Rehab PT Goals(only PT should resolve) Goal: Pt Will Go Supine/Side To Sit Outcome: Progressing Flowsheets (Taken 03/15/2023 1306) Pt will go Supine/Side to Sit:  Independently  with modified independence Goal: Patient Will Transfer Sit To/From Stand Outcome: Progressing Flowsheets (Taken 03/15/2023 1306) Patient will transfer sit to/from stand:  with modified independence  with supervision Goal: Pt Will Transfer Bed To Chair/Chair To Bed Outcome: Progressing Flowsheets (Taken 03/15/2023 1306) Pt will Transfer Bed to Chair/Chair to Bed:  with modified independence  with supervision Goal: Pt Will Ambulate Outcome: Progressing Flowsheets (Taken 03/15/2023 1306) Pt will Ambulate:  100 feet  with modified independence  with supervision  with least restrictive assistive device   1:06 PM, 03/15/23 Ocie Bob, MPT Physical Therapist with [facility] [phone] office [phone] mobile phone

## 2023-03-15 NOTE — Progress Notes (Signed)
  Echocardiogram 2D Echocardiogram has been performed.  Angel Price 03/15/2023, 3:20 PM

## 2023-03-15 NOTE — Evaluation (Signed)
Physical Therapy Evaluation Patient Details Name: Angel Price MRN: [id number] DOB: July 02, 1965 Today's Date: 03/15/2023  History of Present Illness  Angel Price is a 57 y.o. female with medical history significant of SLE, multiple abdominal surgeries, hypertension, migraines, GERD.  Patient started having headaches yesterday and weakness of his left side.  She has a history of 2 strokes and is on Plavix.  She has been consistent with her medications.  She reports no focal deficits after her strokes.  Her symptoms were worsening so the patient came to the hospital for evaluation.   Clinical Impression  Patient presents with left sided weakness mostly left hand and left ankle dorsiflexion, left hand dominant, able to ambulate in room/hallway without loss of balance, but has limited left ankle dorsiflexion with poor carryover for attempting left heel to toe stepping, once fatigued has to lean on side rails in hallway and required use of RW for returning to room.  Patient tolerated sitting up in chair after therapy. Patient will benefit from continued skilled physical therapy in hospital and recommended venue below to increase strength, balance, endurance for safe ADLs and gait.        If plan is discharge home, recommend the following: A little help with walking and/or transfers;A little help with bathing/dressing/bathroom;Help with stairs or ramp for entrance;Assistance with cooking/housework   Can travel by private vehicle        Equipment Recommendations None recommended by PT  Recommendations for Other Services       Functional Status Assessment Patient has had a recent decline in their functional status and demonstrates the ability to make significant improvements in function in a reasonable and predictable amount of time.     Precautions / Restrictions Precautions Precautions: Fall Restrictions Weight Bearing Restrictions Per Provider Order: No      Mobility  Bed  Mobility Overal bed mobility: Modified Independent             General bed mobility comments: slightly labored movement    Transfers Overall transfer level: Needs assistance Equipment used: None, Rolling walker (2 wheels) Transfers: Sit to/from Stand, Bed to chair/wheelchair/BSC Sit to Stand: Supervision   Step pivot transfers: Supervision, Contact guard assist       General transfer comment: labored movement with decreased stregnth left side    Ambulation/Gait Ambulation/Gait assistance: Contact guard assist, Supervision Gait Distance (Feet): 75 Feet Assistive device: None, Rolling walker (2 wheels) Gait Pattern/deviations: Decreased step length - left, Decreased stance time - left, Decreased dorsiflexion - left, Decreased stride length, Steppage Gait velocity: decreased     General Gait Details: slightly labored movemet with limited left active ankle dorsiflexion, mild steppage gait on left foot, near loss of balance when attempting left heel to toe stepping, once fatigued had to lean on side rails and required RW for safety to return to room  Stairs            Wheelchair Mobility     Tilt Bed    Modified Rankin (Stroke Patients Only)       Balance Overall balance assessment: Needs assistance Sitting-balance support: Feet supported, No upper extremity supported Sitting balance-Leahy Scale: Good Sitting balance - Comments: seated at EOB   Standing balance support: During functional activity, No upper extremity supported Standing balance-Leahy Scale: Fair Standing balance comment: fair/good using RW  Pertinent Vitals/Pain Pain Assessment Pain Assessment: 0-10 Pain Score: 7  Pain Location: headache top of head Pain Descriptors / Indicators: Headache Pain Intervention(s): Limited activity within patient's tolerance, Monitored during session    Home Living Family/patient expects to be discharged to:: Private  residence Living Arrangements: Alone Available Help at Discharge: Family;Available PRN/intermittently Type of Home:  (Condo) Home Access: Stairs to enter Entrance Stairs-Rails: Left Entrance Stairs-Number of Steps: 2 Alternate Level Stairs-Number of Steps: 12 Home Layout: Two level;1/2 bath on main level Home Equipment: Agricultural consultant (2 wheels)      Prior Function Prior Level of Function : Independent/Modified Independent             Mobility Comments: household and short distanced community ambulator using RW PRN, does not drive ADLs Comments: Independent household, assisted for community     Extremity/Trunk Assessment   Upper Extremity Assessment Upper Extremity Assessment: Defer to OT evaluation;Left hand dominant    Lower Extremity Assessment Lower Extremity Assessment: Overall WFL for tasks assessed;LLE deficits/detail LLE Deficits / Details: grossly -4/5 except left ankle dorsiflexion 3/5 LLE Sensation: decreased light touch LLE Coordination: decreased fine motor;decreased gross motor    Cervical / Trunk Assessment Cervical / Trunk Assessment: Normal  Communication   Communication Communication: No apparent difficulties  Cognition Arousal: Alert Behavior During Therapy: WFL for tasks assessed/performed Overall Cognitive Status: Within Functional Limits for tasks assessed                                          General Comments      Exercises     Assessment/Plan    PT Assessment Patient needs continued PT services  PT Problem List Decreased strength;Decreased activity tolerance;Decreased balance;Decreased mobility;Decreased coordination       PT Treatment Interventions DME instruction;Gait training;Stair training;Functional mobility training;Therapeutic activities;Therapeutic exercise;Balance training;Patient/family education    PT Goals (Current goals can be found in the Care Plan section)  Acute Rehab PT Goals Patient Stated  Goal: return home with family/friends to assist PT Goal Formulation: With patient Time For Goal Achievement: 03/18/23 Potential to Achieve Goals: Good    Frequency Min 4X/week     Co-evaluation               AM-PAC PT "6 Clicks" Mobility  Outcome Measure Help needed turning from your back to your side while in a flat bed without using bedrails?: None Help needed moving from lying on your back to sitting on the side of a flat bed without using bedrails?: None Help needed moving to and from a bed to a chair (including a wheelchair)?: A Little Help needed standing up from a chair using your arms (e.g., wheelchair or bedside chair)?: A Little Help needed to walk in hospital room?: A Little Help needed climbing 3-5 steps with a railing? : A Little 6 Click Score: 20    End of Session Equipment Utilized During Treatment: Gait belt Activity Tolerance: Patient tolerated treatment well;Patient limited by fatigue Patient left: in chair;with call bell/phone within reach Nurse Communication: Mobility status PT Visit Diagnosis: Unsteadiness on feet (R26.81);Other abnormalities of gait and mobility (R26.89);Muscle weakness (generalized) (M62.81)    Time: 5643-3295 PT Time Calculation (min) (ACUTE ONLY): 28 min   Charges:   PT Evaluation $PT Eval Moderate Complexity: 1 Mod PT Treatments $Therapeutic Activity: 23-37 mins PT General Charges $$ ACUTE PT VISIT: 1 Visit  1:03 PM, 03/15/23 Ocie Bob, MPT Physical Therapist with [facility] [phone] office [phone] mobile phone

## 2023-03-15 NOTE — Progress Notes (Signed)
PROGRESS NOTE    Angel Price  WUJ:[id number] DOB: 1965-08-06 DOA: 03/14/2023 PCP: Benetta Spar, MD   Brief Narrative:    Angel Price is a 57 y.o. female with medical history significant of SLE, multiple abdominal surgeries, hypertension, migraines, GERD.  Patient started having headaches yesterday and weakness of his left side.  She has a history of 2 strokes and is on Plavix.  She has been consistent with her medications.  Patient was admitted for left-sided weakness and some visual deficits suspected to be related to CVA.  She has recently had some nausea and vomiting and diarrhea and has severe hypokalemia likely contributing to her symptoms as brain MRI is negative.  Assessment & Plan:   Principal Problem:   Left-sided weakness Active Problems:   Hypothyroid   SLE (systemic lupus erythematosus) (HCC)   Essential hypertension, benign   Paroxysmal atrial fibrillation (HCC)  Assessment and Plan:  Left-sided weakness possibly related to hypokalemia There is history of stroke, this would be concerning for another stroke.  CT and CTA of head and neck were negative. MRI without any acute abnormalities Continue aspirin/Plavix Echocardiogram pending PT/OT/speech therapy evaluation Lipid panel with LDL 99 Okay to resume antihypertensives Replete potassium IV and p.o. History of strokes On aspirin and Plavix Hypothyroidism Does not appear to be on levothyroxine Check TSH SLE On Plaquenil and steroids which we will continue Hypertension Okay to resume antihypertensive History of paroxysmal atrial fibrillation Just on aspirin.  Heart rate controlled Will check echocardiogram   DVT prophylaxis:Lovenox Code Status: Full Family Communication: None at bedside Disposition Plan:  Status is: Inpatient Remains inpatient appropriate because: Need for IV medication.   Consultants:  None  Procedures:  None  Antimicrobials:     Subjective: Patient seen  and evaluated today with no new acute complaints or concerns. No acute concerns or events noted overnight.  She continues to have some mild weakness to her left side.  Objective: Vitals:   03/14/23 1952 03/14/23 2319 03/15/23 0410 03/15/23 1146  BP: (!) 154/97 132/86 (!) 138/91 (!) 138/90  Pulse: 73 75 65 81  Resp: 18 18 18    Temp: (!) 97.5 F (36.4 C) 98.5 F (36.9 C) 98.5 F (36.9 C) 98.1 F (36.7 C)  TempSrc: Oral  Oral Oral  SpO2: 100% 92% 100% 91%  Weight:      Height:        Intake/Output Summary (Last 24 hours) at 03/15/2023 1253 Last data filed at 03/15/2023 1100 Gross per 24 hour  Intake 480 ml  Output --  Net 480 ml   Filed Weights   03/14/23 1100  Weight: 65.8 kg    Examination:  General exam: Appears calm and comfortable  Respiratory system: Clear to auscultation. Respiratory effort normal. Cardiovascular system: S1 & S2 heard, RRR.  Gastrointestinal system: Abdomen is soft Central nervous system: Alert and awake Extremities: No edema Skin: No significant lesions noted Psychiatry: Flat affect.    Data Reviewed: I have personally reviewed following labs and imaging studies  CBC: Recent Labs  Lab 03/14/23 1301 03/15/23 0800  WBC 6.4 3.2*  HGB 11.9* 11.2*  HCT 35.2* 31.4*  MCV 80.2 79.3*  PLT 400 348   Basic Metabolic Panel: Recent Labs  Lab 03/14/23 1301 03/15/23 0724  NA 139 136  K 2.7* 2.9*  CL 104 109  CO2 27 22  GLUCOSE 95 97  BUN 8 <5*  CREATININE 0.54 0.40*  CALCIUM 9.3 8.3*  MG 2.0 1.8  GFR: Estimated Creatinine Clearance: 72.5 mL/min (A) (by C-G formula based on SCr of 0.4 mg/dL (L)). Liver Function Tests: Recent Labs  Lab 03/14/23 1301  AST 19  ALT 23  ALKPHOS 67  BILITOT 0.7  PROT 7.0  ALBUMIN 3.7   No results for input(s): "LIPASE", "AMYLASE" in the last 168 hours. No results for input(s): "AMMONIA" in the last 168 hours. Coagulation Profile: No results for input(s): "INR", "PROTIME" in the last 168  hours. Cardiac Enzymes: No results for input(s): "CKTOTAL", "CKMB", "CKMBINDEX", "TROPONINI" in the last 168 hours. BNP (last 3 results) No results for input(s): "PROBNP" in the last 8760 hours. HbA1C: No results for input(s): "HGBA1C" in the last 72 hours. CBG: No results for input(s): "GLUCAP" in the last 168 hours. Lipid Profile: Recent Labs    03/15/23 0425  CHOL 162  HDL 44  LDLCALC 99  TRIG 97  CHOLHDL 3.7   Thyroid Function Tests: No results for input(s): "TSH", "T4TOTAL", "FREET4", "T3FREE", "THYROIDAB" in the last 72 hours. Anemia Panel: No results for input(s): "VITAMINB12", "FOLATE", "FERRITIN", "TIBC", "IRON", "RETICCTPCT" in the last 72 hours. Sepsis Labs: No results for input(s): "PROCALCITON", "LATICACIDVEN" in the last 168 hours.  No results found for this or any previous visit (from the past 240 hours).       Radiology Studies: MR BRAIN WO CONTRAST Result Date: 03/15/2023 CLINICAL DATA:  Acute, stroke suspected. Headache and left-sided weakness. EXAM: MRI HEAD WITHOUT CONTRAST TECHNIQUE: Multiplanar, multiecho pulse sequences of the brain and surrounding structures were obtained without intravenous contrast. COMPARISON:  CT angio head and neck 03/14/2023. FINDINGS: Brain: No acute infarct, hemorrhage, or mass lesion is present. The ventricles are of normal size. No significant white matter lesions are present. Deep brain nuclei are within normal limits. The ventricles are of normal size. No significant extraaxial fluid collection is present. The brainstem and cerebellum are within normal limits. A relatively empty sella is again noted. Midline structures are otherwise within normal limits. Vascular: Flow is present in the major intracranial arteries. Skull and upper cervical spine: The craniocervical junction is normal. Upper cervical spine is within normal limits. Marrow signal is unremarkable. Sinuses/Orbits: Mild mucosal thickening is present in the left greater  than right maxillary sinus in left ethmoid air cells. No fluid levels are present. The paranasal sinuses and mastoid air cells are otherwise clear. The globes and orbits are within normal limits. IMPRESSION: 1. Normal MRI appearance of the brain. No acute or focal lesion to explain the patient's symptoms. 2. Mild mucosal thickening in the left greater than right maxillary sinus and left ethmoid air cells. Electronically Signed   By: Marin Roberts M.D.   On: 03/15/2023 10:16   CT Angio Head Neck W WO CM Result Date: 03/14/2023 CLINICAL DATA:  Neuro deficit, acute, stroke suspected. Headache for 3 days. A left-sided weakness beginning at 6 o'clock p.m. yesterday. EXAM: CT ANGIOGRAPHY HEAD AND NECK WITH AND WITHOUT CONTRAST TECHNIQUE: Multidetector CT imaging of the head and neck was performed using the standard protocol during bolus administration of intravenous contrast. Multiplanar CT image reconstructions and MIPs were obtained to evaluate the vascular anatomy. Carotid stenosis measurements (when applicable) are obtained utilizing NASCET criteria, using the distal internal carotid diameter as the denominator. RADIATION DOSE REDUCTION: This exam was performed according to the departmental dose-optimization program which includes automated exposure control, adjustment of the mA and/or kV according to patient size and/or use of iterative reconstruction technique. CONTRAST:  75mL OMNIPAQUE IOHEXOL 350 MG/ML SOLN  COMPARISON:  MR head without and with contrast and MR angio head 07/13/2021 FINDINGS: CT HEAD FINDINGS Brain: No acute infarct, hemorrhage, or mass lesion is present. No significant white matter lesions are present. Deep brain nuclei are within normal limits. The brainstem and cerebellum are within normal limits. A somewhat dilated relatively empty sella is stable. Midline structures are otherwise unremarkable. Vascular: No hyperdense vessel or unexpected calcification. Skull: Calvarium is intact. No  focal lytic or blastic lesions are present. No significant extracranial soft tissue lesion is present. Sinuses/Orbits: The paranasal sinuses and mastoid air cells are clear. The globes and orbits are within normal limits. Review of the MIP images confirms the above findings CTA NECK FINDINGS Aortic arch: A common origin of the left common carotid artery and innominate artery is noted. Minimal calcifications are present at the arch without aneurysm or stenosis. No dissection is present. Right carotid system: Right common carotid artery is within normal limits. The bifurcation is unremarkable. The cervical right ICA is normal. Left carotid system: The left common carotid artery is within normal limits. Minimal atherosclerotic change is present at the bifurcation without significant stenosis. The cervical left ICA is otherwise normal. Vertebral arteries: The vertebral arteries are codominant. Both vertebral arteries originate from the subclavian arteries without significant stenosis. No significant stenosis is present in either vertebral artery in the neck. Skeleton: Degenerative changes are present lower cervical spine, most evident at C5-6 a calcified central disc protrusion. Straightening of the normal cervical lordosis is present. No focal osseous lesions are present. Other neck: Soft tissues the neck are otherwise unremarkable. Salivary glands are within normal limits. Thyroid is normal. No significant adenopathy is present. No focal mucosal or submucosal lesions are present. Upper chest: Mild ground-glass attenuation is present in the left upper lobe without significant airspace consolidation. No nodule or mass lesion is present. The thoracic inlet is within limits. Review of the MIP images confirms the above findings CTA HEAD FINDINGS Anterior circulation: Mild atherosclerotic calcifications are present within the cavernous internal carotid arteries bilaterally. No significant stenosis is present through the ICA  termini. The A1 and M1 segments are normal. The anterior communicating arteries patent. The MCA bifurcations are normal bilaterally. The ACA and MCA branch vessels are within normal limits. No aneurysm is present. Posterior circulation: PICA origins are visualized and normal. The vertebrobasilar junction and basilar artery are normal. The superior cerebellar arteries originate from basilar tip. The left posterior cerebral artery originates from basilar tip. Right posterior cerebral artery is fed by the posterior communicating artery and P1 segment. The PCA branch vessels are normal bilaterally. No aneurysm is present. Venous sinuses: The dural sinuses are patent. The straight sinus and deep cerebral veins are intact. Cortical veins are within normal limits. No significant vascular malformation is evident. Anatomic variants: None Review of the MIP images confirms the above findings IMPRESSION: 1. Normal noncontrast CT of the head. 2. Stable somewhat dilated relatively empty sella. This is nonspecific, but can be seen in the setting of idiopathic intracranial hypertension. 3. Minimal atherosclerotic change at the left carotid bifurcation and cavernous internal carotid arteries bilaterally without significant stenosis. 4. No significant proximal stenosis, aneurysm, or branch vessel occlusion within the Circle of Willis. 5. Degenerative changes of the lower cervical spine, most evident at C5-6 with a calcified central disc protrusion. 6. Mild ground-glass attenuation in the left upper lobe without significant airspace consolidation. This may represent atelectasis or developing pneumonia. Electronically Signed   By: Virl Son.D.  On: 03/14/2023 16:47        Scheduled Meds:  acetaminophen  650 mg Oral Once   aspirin  300 mg Rectal Daily   Or   aspirin  325 mg Oral Daily   clopidogrel  75 mg Oral Daily   DULoxetine  30 mg Oral BID   enoxaparin (LOVENOX) injection  40 mg Subcutaneous Q24H    escitalopram  20 mg Oral Daily   gabapentin  100 mg Oral TID   hydroxychloroquine  300 mg Oral Daily   methocarbamol  500 mg Oral BID   pantoprazole  40 mg Oral Daily   potassium chloride  40 mEq Oral BID   Rimegepant Sulfate  75 mg Oral Daily   rosuvastatin  20 mg Oral Daily   Continuous Infusions:  potassium chloride 10 mEq (03/15/23 1222)     LOS: 1 day    Time spent: 35 minutes    Zaven Klemens Hoover Brunette, DO [facility]  If 7PM-7AM, please contact night-coverage [email or web address] 03/15/2023, 12:53 PM

## 2023-03-15 NOTE — TOC Initial Note (Signed)
Transition of Care [facility]) - Initial/Assessment Note    Patient Details  Name: Angel Price MRN: [id number] Date of Birth: 05/07/1965  Transition of Care (TOC) CM/SW Contact:    Armanda Heritage, RN Phone Number: 03/15/2023, 2:15 PM  Clinical Narrative:                 CM spoke with patient who reports she lives at home alone and was independent prior to admission.  Patient reports she has a ride that can help her get to outpatient appointments and a pcp.  Discussed current PT recommendations for outpatient PT services, patient is in agreement.  A referral has been placed.  No further TOC needs have been identified at this time.   Expected Discharge Plan: Home/Self Care Barriers to Discharge: Continued Medical Work up   Patient Goals and CMS Choice Patient states their goals for this hospitalization and ongoing recovery are:: to get better and go home   Choice offered to / list presented to : NA      Expected Discharge Plan and Services In-house Referral: NA   Post Acute Care Choice: NA Living arrangements for the past 2 months: Single Family Home                 DME Arranged: N/A DME Agency: NA       HH Arranged: NA          Prior Living Arrangements/Services Living arrangements for the past 2 months: Single Family Home Lives with:: Self Patient language and need for interpreter reviewed:: Yes Do you feel safe going back to the place where you live?: Yes      Need for Family Participation in Patient Care: No (Comment) Care giver support system in place?: Yes (comment)   Criminal Activity/Legal Involvement Pertinent to Current Situation/Hospitalization: No - Comment as needed  Activities of Daily Living      Permission Sought/Granted                  Emotional Assessment Appearance:: Appears stated age Attitude/Demeanor/Rapport: Engaged Affect (typically observed): Accepting Orientation: : Oriented to Self, Oriented to Place, Oriented to   Time, Oriented to Situation   Psych Involvement: No (comment)  Admission diagnosis:  Hypokalemia [E87.6] Neurocognitive deficits [R29.818, R41.89] Left-sided weakness [R53.1] Uncontrolled hypertension [I10] Generalized headache [R51.9] Left sided numbness [R20.0] Patient Active Problem List   Diagnosis Date Noted   Seropositive rheumatoid arthritis (HCC) 05/02/2022   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   Cellulitis 10/06/2021   Paroxysmal atrial fibrillation (HCC) 10/06/2021   Abdominal fluid collection 10/06/2021   Constipation 09/14/2021   Ventral hernia 05/15/2021   Dysphagia 05/15/2021   Tobacco dependence 09/12/2020   Continuous dependence on cigarette smoking 08/22/2020   Mixed hyperlipidemia 04/27/2020   Recurrent incisional hernia 02/03/2020   Bilateral knee pain 12/29/2019   Coronary artery disease of native artery of native heart with stable angina pectoris (HCC) 06/25/2019   Acute respiratory failure with hypoxia (HCC) 05/20/2019   Essential hypertension 05/20/2019   COVID-19 05/20/2019   Syncope and collapse 05/20/2019   Metabolic acidosis 05/16/2019   Intractable vomiting with nausea 02/23/2019   Cocaine abuse (HCC)    Colitis 11/11/2018   Hemorrhoidal skin tag    Folate deficiency 05/11/2017   Abdominal wall fistula    Cellulitis of abdominal wall 01/02/2017   Acute left hemiparesis (HCC) 12/05/2016   Malingering 12/05/2016   Weakness of left lower extremity 12/02/2016   CVA (cerebral vascular accident) (HCC) 11/23/2016  Bright red rectal bleeding 08/22/2016   Hypotension due to blood loss    Acute GI bleeding 05/24/2016   Dieulafoy lesion of duodenum    History of Billroth II operation    Gastrointestinal hemorrhage 05/22/2016   Absolute anemia    Acute pancreatitis 10/01/2015   Alcohol abuse with intoxication (HCC)    Left-sided weakness    Psychosomatic factor in physical condition    Upper GI bleed    Diverticulosis of colon with  hemorrhage    Chronic wound infection of abdomen 12/22/2014   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   HTN (hypertension) 12/22/2014   Ataxia 11/01/2014   Hemorrhoids 04/26/2014   Lung nodule 04/26/2014   Diverticulosis    Gastritis    Hiatal hernia    Schatzki's ring    Acute blood loss anemia 04/25/2014   Hypokalemia 04/25/2014   Hematemesis with nausea 04/25/2014   Lung nodule < 6cm on CT 04/25/2014   Intractable nausea and vomiting 04/24/2014   Gastroparesis    Abdominal pain 04/01/2014   Gastroenteritis 12/10/2013   Chronic abdominal wound infection 08/16/2013   MDD (major depressive disorder), recurrent episode, severe (HCC) 06/27/2013   Wrist laceration 06/24/2013   Diarrhea 05/07/2013   Rectal bleeding 05/07/2013   Adrenal mass, left (HCC) 05/07/2013   Post-operative wound abscess 04/19/2013   Abdominal wall abscess 04/18/2013   Essential hypertension, benign 03/30/2013   History of cocaine abuse (HCC) 03/16/2013   History of schizoaffective disorder 03/16/2013   Bipolar disorder (HCC) 03/16/2013   Personality disorder (HCC) 03/16/2013   Tobacco use disorder 03/16/2013   Alcohol abuse 03/16/2013   Palpitations 03/16/2013   Poor vision 03/16/2013   History of gastric bypass 03/16/2013   Status post hysterectomy with oophorectomy 03/16/2013   Hypothyroid 03/16/2013   SLE (systemic lupus erythematosus) (HCC) 03/16/2013   PCP:  Benetta Spar, MD Pharmacy:   [facility] [phone] [place], [place] - [address] AT [facility] [address] [facility] [address] [place] [address] Phone: [phone] Fax: [phone]  [facility] - [place], [place] - [address] [place] [address] Phone: [phone] Fax: [phone]     Social Drivers of Health (SDOH) Social History: SDOH Screenings   Food Insecurity: No Food Insecurity (03/14/2023)  Housing: Patient Declined (03/14/2023)  Transportation Needs: Patient Declined  (03/14/2023)  Utilities: Not At Risk (03/14/2023)  Alcohol Screen: Low Risk  (03/15/2019)  Depression (PHQ2-9): Medium Risk (03/15/2019)  Financial Resource Strain: Low Risk  (03/15/2019)  Physical Activity: Inactive (03/15/2019)  Social Connections: Moderately Isolated (03/15/2019)  Stress: Stress Concern Present (03/15/2019)  Tobacco Use: High Risk (03/14/2023)   SDOH Interventions:     Readmission Risk Interventions    03/15/2023    2:12 PM  Readmission Risk Prevention Plan  Transportation Screening Complete  PCP or Specialist Appt within 3-5 Days Complete  HRI or Home Care Consult Complete  Social Work Consult for Recovery Care Planning/Counseling Complete  Palliative Care Screening Complete  Medication Review Oceanographer) Complete

## 2023-03-16 DIAGNOSIS — R531 Weakness: Secondary | ICD-10-CM | POA: Diagnosis not present

## 2023-03-16 LAB — CBC
HCT: 31.4 % — ABNORMAL LOW (ref 36.0–46.0)
Hemoglobin: 10.8 g/dL — ABNORMAL LOW (ref 12.0–15.0)
MCH: 27.7 pg (ref 26.0–34.0)
MCHC: 34.4 g/dL (ref 30.0–36.0)
MCV: 80.5 fL (ref 80.0–100.0)
Platelets: 369 10*3/uL (ref 150–400)
RBC: 3.9 MIL/uL (ref 3.87–5.11)
RDW: 17.5 % — ABNORMAL HIGH (ref 11.5–15.5)
WBC: 4.4 10*3/uL (ref 4.0–10.5)
nRBC: 0 % (ref 0.0–0.2)

## 2023-03-16 LAB — BASIC METABOLIC PANEL
Anion gap: 7 (ref 5–15)
BUN: 5 mg/dL — ABNORMAL LOW (ref 6–20)
CO2: 23 mmol/L (ref 22–32)
Calcium: 9 mg/dL (ref 8.9–10.3)
Chloride: 106 mmol/L (ref 98–111)
Creatinine, Ser: 0.4 mg/dL — ABNORMAL LOW (ref 0.44–1.00)
GFR, Estimated: >60 mL/min (ref >60)
Glucose, Bld: 97 mg/dL (ref 70–99)
Potassium: 4 mmol/L (ref 3.5–5.1)
Sodium: 136 mmol/L (ref 135–145)

## 2023-03-16 LAB — MAGNESIUM: Magnesium: 1.8 mg/dL (ref 1.7–2.4)

## 2023-03-16 NOTE — Discharge Summary (Signed)
Physician Discharge Summary  Angel Price:[id number] DOB: 1965-11-13 DOA: 03/14/2023  PCP: Benetta Spar, MD  Admit date: 03/14/2023  Discharge date: 03/16/2023  Admitted From:Home  Disposition:  Home  Recommendations for Outpatient Follow-up:  Follow up with PCP in 1-2 weeks Continue on home medications as prior  Home Health: None, outpatient PT  Equipment/Devices: None  Discharge Condition:Stable  CODE STATUS: Full  Diet recommendation: Heart Healthy  Brief/Interim Summary: Angel Price is a 57 y.o. female with medical history significant of SLE, multiple abdominal surgeries, hypertension, migraines, GERD.  Patient started having headaches yesterday and weakness of his left side.  She has a history of 2 strokes and is on Plavix.  She has been consistent with her medications.  Patient was admitted for left-sided weakness and some visual deficits suspected to be related to CVA.  She has recently had some nausea and vomiting and diarrhea and has severe hypokalemia likely contributing to her symptoms as brain MRI is negative.  Her potassium levels have now normalized and she is feeling overall much improved and near baseline.  She has been assessed by PT with recommendations for outpatient physical therapy services which will be arranged.  It appears that much of her hypokalemia is related to recent GI losses with trouble with diarrhea and nausea and vomiting at home over the last 2-3 weeks.  This has now resolved.  Discharge Diagnoses:  Principal Problem:   Left-sided weakness Active Problems:   Hypothyroid   SLE (systemic lupus erythematosus) (HCC)   Essential hypertension, benign   Paroxysmal atrial fibrillation (HCC)  Principal discharge diagnosis: Left-sided weakness likely stroke recrudescence in the setting of severe hypokalemia with recent GI losses.  Discharge Instructions  Discharge Instructions     Ambulatory referral to Physical Therapy    Complete by: As directed    Diet - low sodium heart healthy   Complete by: As directed    Increase activity slowly   Complete by: As directed       Allergies as of 03/16/2023       Reactions   Metoclopramide Other (See Comments), Anaphylaxis   EPS symptoms  Jerking movements   Codeine Hives   Reglan [metoclopramide] Other (See Comments)   EPS symptoms         Medication List     TAKE these medications    amLODipine 10 MG tablet Commonly known as: NORVASC Take 10 mg by mouth daily.   aspirin EC 81 MG tablet Take 1 tablet (81 mg total) by mouth daily.   atenolol 50 MG tablet Commonly known as: TENORMIN Take 50 mg by mouth daily.   baclofen 10 MG tablet Commonly known as: LIORESAL Take 1 tablet 3 times a day by oral route.   Cholecalciferol 50 MCG (2000 UT) Caps Take 1 tablet by mouth daily.   clindamycin 300 MG capsule Commonly known as: CLEOCIN Take 300 mg by mouth 2 (two) times daily.   clopidogrel 75 MG tablet Commonly known as: PLAVIX Take 75 mg by mouth daily.   cyclobenzaprine 10 MG tablet Commonly known as: FLEXERIL Take 10 mg by mouth every 8 (eight) hours as needed.   dicyclomine 10 MG capsule Commonly known as: BENTYL Take by mouth.   dicyclomine 20 MG tablet Commonly known as: BENTYL Take 20 mg by mouth every 6 (six) hours as needed.   diltiazem 240 MG 24 hr capsule Commonly known as: CARDIZEM CD Take 1 capsule every day by oral route.   DSS  100 MG Caps Take 1 capsule by mouth 2 (two) times daily.   DULoxetine 30 MG capsule Commonly known as: CYMBALTA Take 30 mg by mouth 2 (two) times daily.   escitalopram 20 MG tablet Commonly known as: LEXAPRO Take 1 tablet by mouth daily.   esomeprazole 40 MG capsule Commonly known as: NexIUM Take 1 capsule (40 mg total) by mouth 2 (two) times daily before a meal.   gabapentin 100 MG capsule Commonly known as: NEURONTIN Take 100 mg by mouth 3 (three) times daily.    HYDROcodone-acetaminophen 5-325 MG tablet Commonly known as: Norco Take 1-2 tablets by mouth every 6 (six) hours as needed.   hydroxychloroquine 200 MG tablet Commonly known as: PLAQUENIL Take 1.5 tablets (300 mg total) by mouth daily.   ibuprofen 800 MG tablet Commonly known as: ADVIL Take 1 tablet by mouth 2 (two) times daily as needed.   isosorbide mononitrate 60 MG 24 hr tablet Commonly known as: IMDUR TAKE 1/2 TABLET BY MOUTH DAILY   levocetirizine 5 MG tablet Commonly known as: XYZAL Take 1 tablet by mouth daily.   lisinopril-hydrochlorothiazide 20-12.5 MG tablet Commonly known as: ZESTORETIC Take 1 tablet by mouth every morning.   losartan-hydrochlorothiazide 100-25 MG tablet Commonly known as: HYZAAR Take 1 tablet by mouth daily.   methocarbamol 500 MG tablet Commonly known as: ROBAXIN Take 500 mg by mouth 2 (two) times daily.   metoprolol tartrate 50 MG tablet Commonly known as: LOPRESSOR Take by mouth.   mirtazapine 15 MG tablet Commonly known as: REMERON Take 15 mg by mouth at bedtime as needed.   nitroGLYCERIN 0.4 MG SL tablet Commonly known as: NITROSTAT Place 1 tablet (0.4 mg total) under the tongue every 5 (five) minutes as needed for chest pain.   Nurtec 75 MG Tbdp Generic drug: Rimegepant Sulfate Take 75 mg by mouth as needed.   oxyCODONE-acetaminophen 5-325 MG tablet Commonly known as: PERCOCET/ROXICET Take 1 tablet by mouth every 6 (six) hours as needed for severe pain.   pantoprazole 40 MG tablet Commonly known as: PROTONIX Take 1 tablet by mouth daily.   predniSONE 5 MG tablet Commonly known as: DELTASONE Take by mouth.   prochlorperazine 10 MG tablet Commonly known as: COMPAZINE   promethazine 12.5 MG tablet Commonly known as: PHENERGAN Take 12.5 mg by mouth every 6 (six) hours as needed for nausea or vomiting.   rosuvastatin 20 MG tablet Commonly known as: CRESTOR Take 1 tablet (20 mg total) by mouth daily.   SUMAtriptan  50 MG tablet Commonly known as: IMITREX TAKE 1 TABLET BY MOUTH EVERY 2 HOURS AS NEEDED FOR MIGRAINES MAY REPEAT IN 2 HOURS   thiamine 100 MG tablet Take 100 mg by mouth daily.   tiZANidine 2 MG tablet Commonly known as: ZANAFLEX Take 2 mg by mouth at bedtime.   zolpidem 12.5 MG CR tablet Commonly known as: AMBIEN CR Take by mouth.        Follow-up Information     Fanta, Wayland Salinas, MD. Schedule an appointment as soon as possible for a visit in 1 week(s).   Specialty: Internal Medicine Contact information: [address] [place] [address] [phone]                Allergies  Allergen Reactions   Metoclopramide Other (See Comments) and Anaphylaxis    EPS symptoms  Jerking movements   Codeine Hives   Reglan [Metoclopramide] Other (See Comments)    EPS symptoms     Consultations: None  Procedures/Studies: ECHOCARDIOGRAM COMPLETE Result Date: 03/15/2023    ECHOCARDIOGRAM REPORT   Patient Name:   SHYLEEN RIEKE Date of Exam: 03/15/2023 Medical Rec #:  [id number]        Height:       63.0 in Accession #:    [id number]       Weight:       145.0 lb Date of Birth:  28-Mar-1965        BSA:          1.687 m Patient Age:    57 years         BP:           138/91 mmHg Patient Gender: F                HR:           68 bpm. [facility] Procedure: 2D Echo, Cardiac Doppler and Color Doppler Indications:    TIA  History:        Patient has prior history of Echocardiogram examinations, most                 recent 05/20/2019. Abnormal ECG, Stroke, Arrythmias:Atrial                 Fibrillation, Signs/Symptoms:Syncope; Risk Factors:Hypertension.                 Lupus. Cocaine use. ETOH.  Sonographer:    Sheralyn Boatman RDCS Referring Phys: [phone] JACOB J STINSON IMPRESSIONS  1. Left ventricular ejection fraction, by estimation, is 60 to 65%. The left ventricle has normal function. The left ventricle has no regional wall motion abnormalities. Left ventricular  diastolic parameters were normal.  2. Right ventricular systolic function is normal. The right ventricular size is normal. There is normal pulmonary artery systolic pressure.  3. The mitral valve is abnormal. Trivial mitral valve regurgitation. No evidence of mitral stenosis.  4. The aortic valve is tricuspid. Aortic valve regurgitation is not visualized. No aortic stenosis is present.  5. Aortic dilatation noted. There is mild dilatation of the aortic root, measuring 38 mm.  6. The inferior vena cava is normal in size with greater than 50% respiratory variability, suggesting right atrial pressure of 3 mmHg. FINDINGS  Left Ventricle: Left ventricular ejection fraction, by estimation, is 60 to 65%. The left ventricle has normal function. The left ventricle has no regional wall motion abnormalities. The left ventricular internal cavity size was normal in size. There is  no left ventricular hypertrophy. Left ventricular diastolic parameters were normal. Right Ventricle: The right ventricular size is normal. No increase in right ventricular wall thickness. Right ventricular systolic function is normal. There is normal pulmonary artery systolic pressure. The tricuspid regurgitant velocity is 2.50 m/s, and  with an assumed right atrial pressure of 8 mmHg, the estimated right ventricular systolic pressure is 33.0 mmHg. Left Atrium: Left atrial size was normal in size. Right Atrium: Right atrial size was normal in size. Pericardium: There is no evidence of pericardial effusion. Mitral Valve: The mitral valve is abnormal. There is mild thickening of the mitral valve leaflet(s). Trivial mitral valve regurgitation. No evidence of mitral valve stenosis. Tricuspid Valve: The tricuspid valve is normal in structure. Tricuspid valve regurgitation is not demonstrated. No evidence of tricuspid stenosis. Aortic Valve: The aortic valve is tricuspid. Aortic valve regurgitation is not visualized. No aortic stenosis is present. Pulmonic  Valve: The pulmonic valve was normal in structure. Pulmonic valve regurgitation is  not visualized. No evidence of pulmonic stenosis. Aorta: Aortic dilatation noted. There is mild dilatation of the aortic root, measuring 38 mm. Venous: The inferior vena cava is normal in size with greater than 50% respiratory variability, suggesting right atrial pressure of 3 mmHg. IAS/Shunts: No atrial level shunt detected by color flow Doppler.  LEFT VENTRICLE PLAX 2D LVIDd:         4.20 cm     Diastology LVIDs:         3.00 cm     LV e' medial:    5.00 cm/s LV PW:         1.60 cm     LV E/e' medial:  17.9 LV IVS:        1.00 cm     LV e' lateral:   6.20 cm/s LVOT diam:     2.10 cm     LV E/e' lateral: 14.5 LV SV:         81 LV SV Index:   48 LVOT Area:     3.46 cm  LV Volumes (MOD) LV vol d, MOD A2C: 75.6 ml LV vol d, MOD A4C: 44.9 ml LV vol s, MOD A2C: 25.1 ml LV vol s, MOD A4C: 17.3 ml LV SV MOD A2C:     50.5 ml LV SV MOD A4C:     44.9 ml LV SV MOD BP:      35.7 ml RIGHT VENTRICLE             IVC RV S prime:     10.90 cm/s  IVC diam: 2.00 cm TAPSE (M-mode): 1.7 cm LEFT ATRIUM             Index        RIGHT ATRIUM           Index LA diam:        3.80 cm 2.25 cm/m   RA Area:     10.20 cm LA Vol (A2C):   32.7 ml 19.39 ml/m  RA Volume:   18.50 ml  10.97 ml/m LA Vol (A4C):   29.8 ml 17.67 ml/m LA Biplane Vol: 31.2 ml 18.50 ml/m  AORTIC VALVE LVOT Vmax:   115.00 cm/s LVOT Vmean:  70.400 cm/s LVOT VTI:    0.234 m  AORTA Ao Root diam: 3.80 cm Ao Asc diam:  3.60 cm MITRAL VALVE               TRICUSPID VALVE MV Area (PHT): 3.88 cm    TR Peak grad:   25.0 mmHg MV Decel Time: 196 msec    TR Vmax:        250.00 cm/s MV E velocity: 89.65 cm/s MV A velocity: 95.90 cm/s  SHUNTS MV E/A ratio:  0.93        Systemic VTI:  0.23 m                            Systemic Diam: 2.10 cm Charlton Haws MD Electronically signed by Charlton Haws MD Signature Date/Time: 03/15/2023/3:14:22 PM    Final    MR BRAIN WO CONTRAST Result Date:  03/15/2023 CLINICAL DATA:  Acute, stroke suspected. Headache and left-sided weakness. EXAM: MRI HEAD WITHOUT CONTRAST TECHNIQUE: Multiplanar, multiecho pulse sequences of the brain and surrounding structures were obtained without intravenous contrast. COMPARISON:  CT angio head and neck 03/14/2023. FINDINGS: Brain: No acute infarct, hemorrhage, or mass lesion is present. The ventricles are of normal  size. No significant white matter lesions are present. Deep brain nuclei are within normal limits. The ventricles are of normal size. No significant extraaxial fluid collection is present. The brainstem and cerebellum are within normal limits. A relatively empty sella is again noted. Midline structures are otherwise within normal limits. Vascular: Flow is present in the major intracranial arteries. Skull and upper cervical spine: The craniocervical junction is normal. Upper cervical spine is within normal limits. Marrow signal is unremarkable. Sinuses/Orbits: Mild mucosal thickening is present in the left greater than right maxillary sinus in left ethmoid air cells. No fluid levels are present. The paranasal sinuses and mastoid air cells are otherwise clear. The globes and orbits are within normal limits. IMPRESSION: 1. Normal MRI appearance of the brain. No acute or focal lesion to explain the patient's symptoms. 2. Mild mucosal thickening in the left greater than right maxillary sinus and left ethmoid air cells. Electronically Signed   By: Marin Roberts M.D.   On: 03/15/2023 10:16   CT Angio Head Neck W WO CM Result Date: 03/14/2023 CLINICAL DATA:  Neuro deficit, acute, stroke suspected. Headache for 3 days. A left-sided weakness beginning at 6 o'clock p.m. yesterday. EXAM: CT ANGIOGRAPHY HEAD AND NECK WITH AND WITHOUT CONTRAST TECHNIQUE: Multidetector CT imaging of the head and neck was performed using the standard protocol during bolus administration of intravenous contrast. Multiplanar CT image  reconstructions and MIPs were obtained to evaluate the vascular anatomy. Carotid stenosis measurements (when applicable) are obtained utilizing NASCET criteria, using the distal internal carotid diameter as the denominator. RADIATION DOSE REDUCTION: This exam was performed according to the departmental dose-optimization program which includes automated exposure control, adjustment of the mA and/or kV according to patient size and/or use of iterative reconstruction technique. CONTRAST:  75mL OMNIPAQUE IOHEXOL 350 MG/ML SOLN COMPARISON:  MR head without and with contrast and MR angio head 07/13/2021 FINDINGS: CT HEAD FINDINGS Brain: No acute infarct, hemorrhage, or mass lesion is present. No significant white matter lesions are present. Deep brain nuclei are within normal limits. The brainstem and cerebellum are within normal limits. A somewhat dilated relatively empty sella is stable. Midline structures are otherwise unremarkable. Vascular: No hyperdense vessel or unexpected calcification. Skull: Calvarium is intact. No focal lytic or blastic lesions are present. No significant extracranial soft tissue lesion is present. Sinuses/Orbits: The paranasal sinuses and mastoid air cells are clear. The globes and orbits are within normal limits. Review of the MIP images confirms the above findings CTA NECK FINDINGS Aortic arch: A common origin of the left common carotid artery and innominate artery is noted. Minimal calcifications are present at the arch without aneurysm or stenosis. No dissection is present. Right carotid system: Right common carotid artery is within normal limits. The bifurcation is unremarkable. The cervical right ICA is normal. Left carotid system: The left common carotid artery is within normal limits. Minimal atherosclerotic change is present at the bifurcation without significant stenosis. The cervical left ICA is otherwise normal. Vertebral arteries: The vertebral arteries are codominant. Both  vertebral arteries originate from the subclavian arteries without significant stenosis. No significant stenosis is present in either vertebral artery in the neck. Skeleton: Degenerative changes are present lower cervical spine, most evident at C5-6 a calcified central disc protrusion. Straightening of the normal cervical lordosis is present. No focal osseous lesions are present. Other neck: Soft tissues the neck are otherwise unremarkable. Salivary glands are within normal limits. Thyroid is normal. No significant adenopathy is present. No focal mucosal or  submucosal lesions are present. Upper chest: Mild ground-glass attenuation is present in the left upper lobe without significant airspace consolidation. No nodule or mass lesion is present. The thoracic inlet is within limits. Review of the MIP images confirms the above findings CTA HEAD FINDINGS Anterior circulation: Mild atherosclerotic calcifications are present within the cavernous internal carotid arteries bilaterally. No significant stenosis is present through the ICA termini. The A1 and M1 segments are normal. The anterior communicating arteries patent. The MCA bifurcations are normal bilaterally. The ACA and MCA branch vessels are within normal limits. No aneurysm is present. Posterior circulation: PICA origins are visualized and normal. The vertebrobasilar junction and basilar artery are normal. The superior cerebellar arteries originate from basilar tip. The left posterior cerebral artery originates from basilar tip. Right posterior cerebral artery is fed by the posterior communicating artery and P1 segment. The PCA branch vessels are normal bilaterally. No aneurysm is present. Venous sinuses: The dural sinuses are patent. The straight sinus and deep cerebral veins are intact. Cortical veins are within normal limits. No significant vascular malformation is evident. Anatomic variants: None Review of the MIP images confirms the above findings IMPRESSION:  1. Normal noncontrast CT of the head. 2. Stable somewhat dilated relatively empty sella. This is nonspecific, but can be seen in the setting of idiopathic intracranial hypertension. 3. Minimal atherosclerotic change at the left carotid bifurcation and cavernous internal carotid arteries bilaterally without significant stenosis. 4. No significant proximal stenosis, aneurysm, or branch vessel occlusion within the Circle of Willis. 5. Degenerative changes of the lower cervical spine, most evident at C5-6 with a calcified central disc protrusion. 6. Mild ground-glass attenuation in the left upper lobe without significant airspace consolidation. This may represent atelectasis or developing pneumonia. Electronically Signed   By: Marin Roberts M.D.   On: 03/14/2023 16:47   CT ABDOMEN PELVIS WO CONTRAST Result Date: 02/21/2023 CLINICAL DATA:  Right upper quadrant abdominal pain. EXAM: CT ABDOMEN AND PELVIS WITHOUT CONTRAST TECHNIQUE: Multidetector CT imaging of the abdomen and pelvis was performed following the standard protocol without IV contrast. RADIATION DOSE REDUCTION: This exam was performed according to the departmental dose-optimization program which includes automated exposure control, adjustment of the mA and/or kV according to patient size and/or use of iterative reconstruction technique. COMPARISON:  October 05, 2021 FINDINGS: Lower chest: No acute abnormality. Hepatobiliary: No focal liver abnormality is seen. Status post cholecystectomy. No biliary dilatation. Pancreas: Unremarkable. No pancreatic ductal dilatation or surrounding inflammatory changes. Spleen: Normal in size without focal abnormality. Adrenals/Urinary Tract: A 1.7 cm x 1.5 cm low-attenuation left adrenal mass is noted (approximately -5.92 Hounsfield units). The right adrenal gland is surgically absent. Kidneys are normal, without renal calculi, focal lesion, or hydronephrosis. Bladder is unremarkable. Stomach/Bowel: Surgical sutures are  seen within the gastric region. The appendix is not identified. Surgically anastomosed bowel is noted within the anterior aspect of the mid left abdomen and mid to lower right abdomen. No evidence of bowel wall thickening, distention, or inflammatory changes. Vascular/Lymphatic: Aortic atherosclerosis. No enlarged abdominal or pelvic lymph nodes. Reproductive: Status post hysterectomy. No adnexal masses. Other: A surgical scar seen along the anterior abdominal wall. No abdominopelvic ascites. Musculoskeletal: No acute or significant osseous findings. IMPRESSION: 1. Evidence of prior cholecystectomy, right adrenalectomy and bowel surgery. 2. Low-attenuation left adrenal mass, likely consistent with an adrenal adenoma. No follow-up imaging is recommended. This recommendation follows ACR consensus guidelines: Management of Incidental Adrenal Masses: A White Paper of the ACR Incidental Findings Committee. J Am  Coll Radiol 2017;14:[id number]. 3. Aortic atherosclerosis. Aortic Atherosclerosis (ICD10-I70.0). Electronically Signed   By: Aram Candela M.D.   On: 02/21/2023 02:33     Discharge Exam: Vitals:   03/16/23 0445 03/16/23 0825  BP: 131/70 (!) 154/110  Pulse:  63  Resp: 19   Temp: 98.4 F (36.9 C)   SpO2: 100%    Vitals:   03/15/23 1913 03/16/23 0057 03/16/23 0445 03/16/23 0825  BP: (!) 151/98 (!) 141/93 131/70 (!) 154/110  Pulse: 72 67  63  Resp: 17 18 19    Temp: 98.1 F (36.7 C) 97.8 F (36.6 C) 98.4 F (36.9 C)   TempSrc: Oral Oral Oral   SpO2: 100% 96% 100%   Weight:      Height:        General: Pt is alert, awake, not in acute distress Cardiovascular: RRR, S1/S2 +, no rubs, no gallops Respiratory: CTA bilaterally, no wheezing, no rhonchi Abdominal: Soft, NT, ND, bowel sounds + Extremities: no edema, no cyanosis    The results of significant diagnostics from this hospitalization (including imaging, microbiology, ancillary and laboratory) are listed below for reference.      Microbiology: No results found for this or any previous visit (from the past 240 hours).   Labs: BNP (last 3 results) No results for input(s): "BNP" in the last 8760 hours. Basic Metabolic Panel: Recent Labs  Lab 03/14/23 1301 03/15/23 0724 03/16/23 0404  NA 139 136 136  K 2.7* 2.9* 4.0  CL 104 109 106  CO2 27 22 23   GLUCOSE 95 97 97  BUN 8 <5* 5*  CREATININE 0.54 0.40* 0.40*  CALCIUM 9.3 8.3* 9.0  MG 2.0 1.8 1.8   Liver Function Tests: Recent Labs  Lab 03/14/23 1301  AST 19  ALT 23  ALKPHOS 67  BILITOT 0.7  PROT 7.0  ALBUMIN 3.7   No results for input(s): "LIPASE", "AMYLASE" in the last 168 hours. No results for input(s): "AMMONIA" in the last 168 hours. CBC: Recent Labs  Lab 03/14/23 1301 03/15/23 0800 03/16/23 0404  WBC 6.4 3.2* 4.4  HGB 11.9* 11.2* 10.8*  HCT 35.2* 31.4* 31.4*  MCV 80.2 79.3* 80.5  PLT 400 348 369   Cardiac Enzymes: No results for input(s): "CKTOTAL", "CKMB", "CKMBINDEX", "TROPONINI" in the last 168 hours. BNP: Invalid input(s): "POCBNP" CBG: No results for input(s): "GLUCAP" in the last 168 hours. D-Dimer No results for input(s): "DDIMER" in the last 72 hours. Hgb A1c No results for input(s): "HGBA1C" in the last 72 hours. Lipid Profile Recent Labs    03/15/23 0425  CHOL 162  HDL 44  LDLCALC 99  TRIG 97  CHOLHDL 3.7   Thyroid function studies Recent Labs    03/15/23 0723  TSH 0.903   Anemia work up No results for input(s): "VITAMINB12", "FOLATE", "FERRITIN", "TIBC", "IRON", "RETICCTPCT" in the last 72 hours. Urinalysis    Component Value Date/Time   COLORURINE AMBER (A) 02/20/2023 2335   APPEARANCEUR CLEAR 02/20/2023 2335   LABSPEC 1.025 02/20/2023 2335   PHURINE 5.0 02/20/2023 2335   GLUCOSEU NEGATIVE 02/20/2023 2335   HGBUR SMALL (A) 02/20/2023 2335   BILIRUBINUR NEGATIVE 02/20/2023 2335   KETONESUR 20 (A) 02/20/2023 2335   PROTEINUR >=300 (A) 02/20/2023 2335   UROBILINOGEN 0.2 11/26/2014 0915    NITRITE NEGATIVE 02/20/2023 2335   LEUKOCYTESUR TRACE (A) 02/20/2023 2335   Sepsis Labs Recent Labs  Lab 03/14/23 1301 03/15/23 0800 03/16/23 0404  WBC 6.4 3.2* 4.4   Microbiology No results  found for this or any previous visit (from the past 240 hours).   Time coordinating discharge: 35 minutes  SIGNED:   Erick Blinks, DO [facility] 03/16/2023, 9:58 AM  If 7PM-7AM, please contact night-coverage [email or web address]

## 2023-03-17 LAB — HEMOGLOBIN A1C
Hgb A1c MFr Bld: 5.1 % (ref 4.8–5.6)
Mean Plasma Glucose: 100 mg/dL

## 2023-03-20 NOTE — Progress Notes (Signed)
 Referring Provider: Carlette Benita Area* Primary Care Physician:  Carlette Benita Area, MD Primary GI Physician: Dr. Shaaron  Chief Complaint  Patient presents with   Diarrhea    Weight loss, diarrhea and stomach makes lots of noise.     HPI:   Angel Price is a 58 y.o. female presenting today with chief complaint of diarrhea.   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      Patient was recently admitted 03/14/2023 - 03/16/2023 with concern for CVA after she presented with left-sided weakness, visual deficits.  However, she was found to have severe hypokalemia in the setting of nausea and vomiting and it was felt that this was contributing to her symptoms as her brain MRI was negative.  After potassium replacement, patient was feeling much better.  Today:  Reports poor appetite, losing weight unintentionally, and having a lot of watery diarrhea.  More than 10 BMs per day. Occurs even when she goes to urinate. Having nocturnal stools as well. No brbpr or melena. Occasional abdominal pain but not routinely. Stomach makes a lot of noise.   Symptoms started about 1 month ago.   Reports she had been doing well prior to 1 month ago, had gained up to 159 lbs. Weighing 141 lbs today.     Chronic issues with vomiting fluid pretty much every day, but no recent change in symptoms and not particularly worried about this right now. Wants to focus on diarrhea. No heartburn.    No recent antibiotics.  Recently prescribed losartan but hasn't started.  No well water .  No travel.  No sick contacts.   No NSAIDs.  PCP started dicyclomine , but not helpful. Taking once a day.      EGD 10/01/2021: Mild Schatzki's ring dilated, small amount of food residue in the stomach, otherwise normal exam.  Last colonoscopy 12/24/2018: Diverticulosis in the entire colon, normal TI, 5 mm polyp resected and retrieved, somewhat friable hemorrhagic mucosa in the area of the ileocecal valve/cecum which may be artifact related to scope trauma s/p biopsy.  Polyp was a tubular adenoma.  Colon biopsy was benign.  Recommended 7-year surveillance.  Past Medical History:  Diagnosis Date   Abscess    soft tissue   Adrenal mass (HCC)    Alcohol abuse    Anemia    Anxiety    Blood transfusion without reported diagnosis    Chronic abdominal pain    Chronic wound infection of abdomen    Colon polyp    colonoscopy 04/2014   Depression    Discoid lupus erythematosus    Diverticulosis    colonoscopy 04/2014 moderat pan colonic   Drug-seeking behavior    Gastritis    EGD 05/2014   Gastroparesis 01/2014   GERD (gastroesophageal reflux disease)    Hemorrhoid    internal large   Hiatal hernia    History of Billroth II operation    Hypertension    Lung nodule    CT 02/2014 needs repeat 1 month   Lung nodule < 6cm on CT 04/25/2014   Lupus    Malingering    Migraine    Nausea and vomiting    chronic, recurrent   Pancreatitis    Schatzki's ring  patent per EGD 04/2014   Sickle cell trait (HCC)    Stroke [facility])    Suicide attempt (HCC)    Thyroid  disease 2000   overactive, radiation    Past Surgical History:  Procedure Laterality Date   ABDOMINAL HYSTERECTOMY  2013   [place]   ABDOMINAL HYSTERECTOMY     ABDOMINAL SURGERY     ADRENALECTOMY Right    AGILE CAPSULE N/A 01/05/2015   Procedure: AGILE CAPSULE;  Surgeon: Lamar CHRISTELLA Hollingshead, MD;  [facility];  Service: Endoscopy;  Laterality: N/A;  0700   BALLOON DILATION N/A 10/01/2021   Procedure: BALLOON DILATION;  Surgeon: Cindie Carlin POUR, DO;  [facility];  Service: Endoscopy;  Laterality: N/A;    Billroth II procedure      [place], first 2000, 2005/2006.   bilroth 2     BIOPSY  05/20/2013   Procedure: BIOPSIES OF ASCENDING AND SIGMOID COLON;  Surgeon: Lamar CHRISTELLA Hollingshead, MD;  Location: [facility];  Service: Endoscopy;;   BIOPSY  04/26/2014   Procedure: BIOPSIES;  Surgeon: Margo LITTIE Haddock, MD;  Location: [facility];  Service: Endoscopy;;   BIOPSY  12/24/2018   Procedure: BIOPSY;  Surgeon: Hollingshead Lamar CHRISTELLA, MD;  [facility];  Service: Endoscopy;;  colon   CHOLECYSTECTOMY     COLONOSCOPY     in [place]   COLONOSCOPY WITH PROPOFOL  N/A 05/20/2013   Dr.Rourk- inadequate prep, normal appearing rectum, grossly normal colon aside from pancolonic diverticula, normal terminal ileum bx= unremarkable colonic mucosa. Due for early interval 2016.    COLONOSCOPY WITH PROPOFOL  N/A 04/26/2014   DOQ:wnmfjo ileum/one colon polyp removed/moderate pan-colonic diverticulosis/large internal hemorrhoids   COLONOSCOPY WITH PROPOFOL  N/A 12/23/2014   Dr.Rourk- minimal internal hemorrhoids, pancolonic diverticulosis   COLONOSCOPY WITH PROPOFOL  N/A 08/23/2016   Procedure: COLONOSCOPY WITH PROPOFOL ;  Surgeon: Haddock Margo LITTIE, MD;  [facility];  Service: Endoscopy;  Laterality: N/A;   COLONOSCOPY WITH PROPOFOL  N/A 12/24/2018   Pancolonic diverticulosis, normal TI, one 5 mm polyp in rectum (tubular adenoma). Somewhat friable hemorrhagic mucosa in area of IC valve/cecum, possibly related to scope trauma s/p biopsy. Repeat in 7 years.   DEBRIDEMENT OF ABDOMINAL WALL ABSCESS N/A 02/08/2013   Procedure: DEBRIDEMENT OF ABDOMINAL WALL ABSCESS;  Surgeon: Oneil DELENA Budge, MD;  Location: [facility];  Service: General;  Laterality: N/A;   ESOPHAGOGASTRODUODENOSCOPY (EGD) WITH PROPOFOL  N/A 05/20/2013   Dr.Rourk- s/p prior gastric surgery with normal esophagus, residual gastric mucosa and patent efferent limb   ESOPHAGOGASTRODUODENOSCOPY (EGD) WITH PROPOFOL  N/A 02/03/2014   Dr. Hollingshead:  s/p hemigastrectomy with retained  gastric contents. Residual gastric mucosa and efferent limb appeared normal otherwise. Query gastroparesis.    ESOPHAGOGASTRODUODENOSCOPY (EGD) WITH PROPOFOL  N/A 04/26/2014   DOQ:dryjusxp'd ring/small HH/mild non-erosive gasrtitis/normal anastomosis   ESOPHAGOGASTRODUODENOSCOPY (EGD) WITH PROPOFOL  N/A 12/23/2014   Dr.Rourk- s/p prior hemigastrctomy, active oozing from anastomotic suture site, hemostasis achieved   ESOPHAGOGASTRODUODENOSCOPY (EGD) WITH PROPOFOL  N/A 05/23/2016   Dr. 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    ESOPHAGOGASTRODUODENOSCOPY (EGD) WITH PROPOFOL  N/A 05/11/2017   Procedure: ESOPHAGOGASTRODUODENOSCOPY (EGD) WITH PROPOFOL ;  Surgeon: Hollingshead Lamar CHRISTELLA, MD;  [facility];  Service: Endoscopy;  Laterality: N/A;   ESOPHAGOGASTRODUODENOSCOPY (EGD) WITH PROPOFOL  N/A 06/07/2021   Procedure: ESOPHAGOGASTRODUODENOSCOPY (EGD) WITH PROPOFOL ;  Surgeon: Hollingshead Lamar CHRISTELLA, MD;  [facility];  Service: Endoscopy;  Laterality: N/A;  11:45am  ESOPHAGOGASTRODUODENOSCOPY (EGD) WITH PROPOFOL  N/A 10/01/2021   Procedure: ESOPHAGOGASTRODUODENOSCOPY (EGD) WITH PROPOFOL ;  Surgeon: Cindie Carlin POUR, DO;  [facility];  Service: Endoscopy;  Laterality: N/A;  3:00pm   EXCISION OF MESH  08/2019   HEMORRHOID SURGERY N/A 06/18/2017   Procedure: THREE COLUMN EXTENSIVE HEMORRHOIDECTOMY;  Surgeon: Kallie Manuelita BROCKS, MD;  Location: [facility];  Service: General;  Laterality: N/A;   HERNIA REPAIR     INCISION AND DRAINAGE ABSCESS N/A 01/06/2017   Procedure: INCISION AND DRAINAGE ABDOMINAL WALL ABSCESS;  Surgeon: Mavis Anes, MD;  Location: [facility];  Service: General;  Laterality: N/A;   incisional hernial repair  09/05/2021   [place]   LEFT HEART CATH AND CORONARY ANGIOGRAPHY N/A 05/09/2020   Procedure: LEFT HEART CATH AND CORONARY ANGIOGRAPHY;  Surgeon: Elmira Newman PARAS, MD;   [facility];  Service: Cardiovascular;  Laterality: N/A;   POLYPECTOMY  12/24/2018   Procedure: POLYPECTOMY;  Surgeon: Shaaron Lamar HERO, MD;  [facility];  Service: Endoscopy;;  colon   tendon repar Right    wrist   WOUND EXPLORATION Right 06/24/2013   Procedure: exploration of traumatic wound right wrist;  Surgeon: Franky JONELLE Curia, MD;  Location: [facility] OR;  Service: Orthopedics;  Laterality: Right;    Current Outpatient Medications  Medication Sig Dispense Refill   amLODipine  (NORVASC ) 10 MG tablet Take 10 mg by mouth daily.     aspirin  EC 81 MG EC tablet Take 1 tablet (81 mg total) by mouth daily. 30 tablet 0   atenolol  (TENORMIN ) 50 MG tablet Take 50 mg by mouth daily.     Cholecalciferol  50 MCG (2000 UT) CAPS Take 1 tablet by mouth daily.     clopidogrel  (PLAVIX ) 75 MG tablet Take 75 mg by mouth daily.     cyclobenzaprine  (FLEXERIL ) 10 MG tablet Take 10 mg by mouth every 8 (eight) hours as needed.     dicyclomine  (BENTYL ) 20 MG tablet Take 20 mg by mouth every 6 (six) hours as needed.     diltiazem  (CARDIZEM  CD) 240 MG 24 hr capsule      DULoxetine  (CYMBALTA ) 30 MG capsule Take 30 mg by mouth 2 (two) times daily.     escitalopram  (LEXAPRO ) 20 MG tablet Take 1 tablet by mouth daily.     gabapentin  (NEURONTIN ) 100 MG capsule Take 100 mg by mouth 3 (three) times daily.     hydroxychloroquine  (PLAQUENIL ) 200 MG tablet Take 1.5 tablets (300 mg total) by mouth daily. 135 tablet 1   isosorbide  mononitrate (IMDUR ) 60 MG 24 hr tablet TAKE 1/2 TABLET BY MOUTH DAILY 90 tablet 3   metoprolol  tartrate (LOPRESSOR ) 50 MG tablet Take by mouth.     nitroGLYCERIN  (NITROSTAT ) 0.4 MG SL tablet Place 1 tablet (0.4 mg total) under the tongue every 5 (five) minutes as needed for chest pain. 30 tablet 3   pantoprazole  (PROTONIX ) 40 MG tablet Take 1 tablet by mouth daily.     prochlorperazine  (COMPAZINE ) 10 MG tablet      promethazine  (PHENERGAN ) 12.5 MG tablet Take 12.5 mg by mouth  every 6 (six) hours as needed for nausea or vomiting.      Rimegepant Sulfate  (NURTEC) 75 MG TBDP Take 75 mg by mouth as needed. 8 tablet 6   rosuvastatin  (CRESTOR ) 20 MG tablet Take 1 tablet (20 mg total) by mouth daily. 90 tablet 3   SUMAtriptan  (IMITREX ) 50 MG tablet TAKE 1 TABLET BY MOUTH EVERY 2 HOURS AS NEEDED  FOR MIGRAINES MAY REPEAT IN 2 HOURS     thiamine  100 MG tablet Take 100 mg by mouth daily.     tiZANidine  (ZANAFLEX ) 2 MG tablet Take 2 mg by mouth at bedtime.     zolpidem  (AMBIEN  CR) 12.5 MG CR tablet Take by mouth.     lisinopril -hydrochlorothiazide  (ZESTORETIC ) 20-12.5 MG tablet Take 1 tablet by mouth every morning. (Patient not taking: Reported on 03/21/2023)     losartan-hydrochlorothiazide  (HYZAAR) 100-25 MG tablet Take 1 tablet by mouth daily. (Patient not taking: Reported on 03/21/2023)     mirtazapine  (REMERON ) 15 MG tablet Take 15 mg by mouth at bedtime as needed. (Patient not taking: Reported on 03/21/2023)     No current facility-administered medications for this visit.    Allergies as of 03/21/2023 - Review Complete 03/21/2023  Allergen Reaction Noted   Metoclopramide  Other (See Comments) and Anaphylaxis 08/08/2016   Codeine Hives 05/19/2019   Reglan  [metoclopramide ] Other (See Comments) 05/19/2019    Family History  Problem Relation Age of Onset   Drug abuse Mother    Lung cancer Father    Cancer Father        mets   Drug abuse Sister    Drug abuse Brother    Breast cancer Maternal Aunt    Bipolar disorder Maternal Aunt    Drug abuse Maternal Aunt    Colon cancer Maternal Grandmother        late 20s, early 60s   Bipolar disorder Paternal Grandfather    Brain cancer Son    Schizophrenia Son    Cancer Son        brain   Bipolar disorder Cousin    Liver disease Neg Hx     Social History   Socioeconomic History   Marital status: Divorced    Spouse name: Not on file   Number of children: 4   Years of education: 10   Highest education level: Not on  file  Occupational History   Not on file  Tobacco Use   Smoking status: Some Days    Current packs/day: 0.25    Average packs/day: 0.3 packs/day for 35.0 years (8.8 ttl pk-yrs)    Types: Cigarettes    Passive exposure: Past   Smokeless tobacco: Never  Vaping Use   Vaping status: Never Used  Substance and Sexual Activity   Alcohol use: Not Currently   Drug use: Not Currently    Types: Cocaine    Comment: no cocaine in4  years per patient (08/20/21)   Sexual activity: Not Currently    Birth control/protection: Surgical  Other Topics Concern   Not on file  Social History Narrative   Caffeine - tea occass   Social Drivers of Health   Financial Resource Strain: Low Risk  (03/15/2019)   Overall Financial Resource Strain (CARDIA)    Difficulty of Paying Living Expenses: Not very hard  Food Insecurity: No Food Insecurity (03/14/2023)   Hunger Vital Sign    Worried About Running Out of Food in the Last Year: Never true    Ran Out of Food in the Last Year: Never true  Transportation Needs: Patient Declined (03/14/2023)   PRAPARE - Transportation    Lack of Transportation (Medical): Patient declined    Lack of Transportation (Non-Medical): Patient declined  Physical Activity: Inactive (03/15/2019)   Exercise Vital Sign    Days of Exercise per Week: 0 days    Minutes of Exercise per Session: 0 min  Stress: Stress Concern Present (03/15/2019)  [employer] of Occupational Health - Occupational Stress Questionnaire    Feeling of Stress : Rather much  Social Connections: Moderately Isolated (03/15/2019)   Social Connection and Isolation Panel [NHANES]    Frequency of Communication with Friends and Family: More than three times a week    Frequency of Social Gatherings with Friends and Family: More than three times a week    Attends Religious Services: 1 to 4 times per year    Active Member of [employer] or Organizations: No    Attends Banker Meetings: Never    Marital  Status: Divorced    Review of Systems: Gen: Denies fever, chills, cold or flu like symptoms, pre-syncope, or syncope.  CV: Denies chest pain, palpitations. Resp: Denies dyspnea at rest, cough. GI: See HPI Heme: See HPI  Physical Exam: BP 135/84 (BP Location: Right Arm, Patient Position: Sitting, Cuff Size: Normal)   Pulse 87   Temp (!) 96.9 F (36.1 C) (Temporal)   Ht 5' 3 (1.6 m)   Wt 141 lb 9.6 oz (64.2 kg)   LMP  (LMP Unknown)   BMI 25.08 kg/m  General:   Alert and oriented. No distress noted. Pleasant and cooperative.  Head:  Normocephalic and atraumatic. Eyes:  Conjuctiva clear without scleral icterus. Heart:  S1, S2 present without murmurs appreciated. Lungs:  Clear to auscultation bilaterally. No wheezes, rales, or rhonchi. No distress.  Abdomen:  +BS, soft, and non-distended. Moderate TTP across the lower abdomen. No rebound or guarding. No HSM or masses noted. Msk:  Symmetrical without gross deformities. Normal posture. Extremities:  Without edema. Neurologic:  Alert and  oriented x4 Psych:  Normal mood and affect.    Assessment:  58 y.o. 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   Diarrhea:  New onset diarrhea x 1 month with >10 watery BMs per day, nocturnal BMs. Reports occasional abdominal pain, though on exam she had moderate TTP across lower abdomen. Also reporting lack of appetite and unintentional weight loss since diarrhea onset. She has chronic daily nausea/vomiting likely secondary to gastroparesis, but patient reports this is chronic and unchanged since her other symptoms started. Documented 8 lb weight loss in the last month.   Denies recent antibiotics, medication changes, travel, sick contacts, well water . PCP started dicyclomine  once a  day, but no improvement.   Etiology unclear. Will plan to check stool studies to rule out infection, screen for celiac disease, and check BMP for electrolyte abnormalities. Will also obtain CT due to abdominal pain. May need to consider early colonoscopy if evaluation is unrevealing. Last colonoscopy was in 2020 with diverticulosis, 5 mm TA, somewhat friable hemorrhagic mucosa in the area of the ileocecal valve/cecum which may be artifact related to scope trauma, pathology was benign. Surveillance planned for 2027.   Nausea/Vomiting:  Likely related to gastroparesis. Daily symptoms which patient reports is chronic and unchanged and was previously able to maintain her weight prior to diarrhea onset. Unable to tolerate Reglan  due to EPS. Reports trying erythromycin in the past but known response. States right now she doesn't need anything for this as she just wants to focus on diarrhea.She manages with compazine  or phenergan  prn.   Anemia:  Chronic mild anemia with Hgb in the 10-11 range. Recent Hgb 10.8 on 03/16/23. No overt GI bleeding. History of B12 and folate deficiency, but  not on supplementation. Also with low iron saturation in 2019. Will update iron, B12, and folate.    Plan:  C diff GDH and Toxin A/B, GI path panel, giardia, cryptosporidium, IgA, ttg IgA, BMP, iron, B12, folate,  CT A/P with IV and oral contrast ASAP.  Bland diet.  Push fluids. Drink fluids with electrolytes.  Gastroparesis recommendations:  4-6 small meals daily Low fat diet Low fiber diet (avoid raw fruits and vegetables). Continue compazine  or phenergan  prn.  Further recommendations to follow. ER precautions discussed.    Josette Centers, PA-C [facility] 03/21/2023

## 2023-03-21 ENCOUNTER — Encounter: Payer: Self-pay | Admitting: Gastroenterology

## 2023-03-21 ENCOUNTER — Ambulatory Visit: Payer: Medicare Other | Admitting: Gastroenterology

## 2023-03-21 VITALS — BP 135/84 | HR 87 | Temp 96.9°F | Ht 63.0 in | Wt 141.6 lb

## 2023-03-21 DIAGNOSIS — R197 Diarrhea, unspecified: Secondary | ICD-10-CM

## 2023-03-21 DIAGNOSIS — Z8639 Personal history of other endocrine, nutritional and metabolic disease: Secondary | ICD-10-CM

## 2023-03-21 DIAGNOSIS — R634 Abnormal weight loss: Secondary | ICD-10-CM

## 2023-03-21 DIAGNOSIS — D649 Anemia, unspecified: Secondary | ICD-10-CM

## 2023-03-21 DIAGNOSIS — R630 Anorexia: Secondary | ICD-10-CM

## 2023-03-21 DIAGNOSIS — Z860101 Personal history of adenomatous and serrated colon polyps: Secondary | ICD-10-CM

## 2023-03-21 DIAGNOSIS — Z8719 Personal history of other diseases of the digestive system: Secondary | ICD-10-CM

## 2023-03-21 DIAGNOSIS — R112 Nausea with vomiting, unspecified: Secondary | ICD-10-CM

## 2023-03-21 DIAGNOSIS — R1030 Lower abdominal pain, unspecified: Secondary | ICD-10-CM

## 2023-03-21 NOTE — Patient Instructions (Addendum)
 Please have stool testing and blood work completed at [facility].  We will get you scheduled for a CT of your abdomen and pelvis to further evaluate your abdominal pain.  Follow a bland diet and focus on maintaining good hydration with liquids that contain electrolytes such as G2, Pedialyte, liquid IV.  Gastroparesis recommendations:  4-6 small meals daily Low fat diet Low fiber diet (avoid raw fruits and vegetables).  Continue using Compazine  or Phenergan  as needed for nausea/vomiting.  I will have further recommendations for you following your blood work and imaging.  If you have any worsening abdominal pain, feel weak, dizzy, start having decreased urination/dark urine suggesting dehydration, I recommend that you return to the emergency room.  Josette Centers, PA-C [facility]

## 2023-03-22 ENCOUNTER — Encounter: Payer: Self-pay | Admitting: Gastroenterology

## 2023-03-24 ENCOUNTER — Ambulatory Visit (HOSPITAL_COMMUNITY): Payer: Medicare Other

## 2023-03-26 ENCOUNTER — Ambulatory Visit (HOSPITAL_COMMUNITY)
Admission: RE | Admit: 2023-03-26 | Discharge: 2023-03-26 | Disposition: A | Discharge status: Home / Self Care | Payer: Medicare Other | Source: Ambulatory Visit | Attending: Gastroenterology | Admitting: Gastroenterology

## 2023-03-26 DIAGNOSIS — R197 Diarrhea, unspecified: Secondary | ICD-10-CM | POA: Diagnosis present

## 2023-03-26 DIAGNOSIS — R630 Anorexia: Secondary | ICD-10-CM | POA: Insufficient documentation

## 2023-03-26 DIAGNOSIS — R1030 Lower abdominal pain, unspecified: Secondary | ICD-10-CM | POA: Insufficient documentation

## 2023-03-26 DIAGNOSIS — R634 Abnormal weight loss: Secondary | ICD-10-CM | POA: Diagnosis present

## 2023-03-26 DIAGNOSIS — R112 Nausea with vomiting, unspecified: Secondary | ICD-10-CM | POA: Diagnosis present

## 2023-03-26 MED ORDER — IOHEXOL 300 MG/ML  SOLN
30.0000 mL | Freq: Once | INTRAMUSCULAR | Status: AC | PRN
  Administered 2023-03-26: 30 mL via ORAL

## 2023-03-26 MED ORDER — IOHEXOL 300 MG/ML  SOLN
100.0000 mL | Freq: Once | INTRAMUSCULAR | Status: AC | PRN
  Administered 2023-03-26: 100 mL via INTRAVENOUS

## 2023-03-27 LAB — BASIC METABOLIC PANEL
BUN/Creatinine Ratio: 13 (calc) (ref 6–22)
BUN: 6 mg/dL — ABNORMAL LOW (ref 7–25)
CO2: 22 mmol/L (ref 20–32)
Calcium: 9.7 mg/dL (ref 8.6–10.4)
Chloride: 103 mmol/L (ref 98–110)
Creat: 0.45 mg/dL — ABNORMAL LOW (ref 0.50–1.03)
Glucose, Bld: 64 mg/dL — ABNORMAL LOW (ref 65–99)
Potassium: 4.1 mmol/L (ref 3.5–5.3)
Sodium: 138 mmol/L (ref 135–146)

## 2023-03-27 LAB — IRON,TIBC AND FERRITIN PANEL
%SAT: 14 % — ABNORMAL LOW (ref 16–45)
Ferritin: 25 ng/mL (ref 16–232)
Iron: 55 ug/dL (ref 45–160)
TIBC: 387 ug/dL (ref 250–450)

## 2023-03-27 LAB — IGA: Immunoglobulin A: 308 mg/dL (ref 47–310)

## 2023-03-27 LAB — B12 AND FOLATE PANEL
Folate: 12.4 ng/mL
Vitamin B-12: 237 pg/mL (ref 200–1100)

## 2023-03-27 LAB — TISSUE TRANSGLUTAMINASE, IGA: (tTG) Ab, IgA: <1 U/mL

## 2023-03-31 ENCOUNTER — Encounter: Payer: Self-pay | Admitting: *Deleted

## 2023-03-31 ENCOUNTER — Other Ambulatory Visit: Payer: Self-pay | Admitting: *Deleted

## 2023-03-31 ENCOUNTER — Other Ambulatory Visit: Payer: Self-pay | Admitting: Gastroenterology

## 2023-03-31 ENCOUNTER — Telehealth: Payer: Self-pay | Admitting: *Deleted

## 2023-03-31 ENCOUNTER — Other Ambulatory Visit (HOSPITAL_COMMUNITY)
Admission: RE | Admit: 2023-03-31 | Discharge: 2023-03-31 | Disposition: A | Discharge status: Home / Self Care | Payer: Medicare Other | Source: Ambulatory Visit | Attending: Gastroenterology | Admitting: Gastroenterology

## 2023-03-31 DIAGNOSIS — R197 Diarrhea, unspecified: Secondary | ICD-10-CM | POA: Diagnosis present

## 2023-03-31 DIAGNOSIS — E876 Hypokalemia: Secondary | ICD-10-CM

## 2023-03-31 LAB — BASIC METABOLIC PANEL
Anion gap: 7 (ref 5–15)
BUN: 9 mg/dL (ref 6–20)
CO2: 25 mmol/L (ref 22–32)
Calcium: 9.3 mg/dL (ref 8.9–10.3)
Chloride: 102 mmol/L (ref 98–111)
Creatinine, Ser: 0.51 mg/dL (ref 0.44–1.00)
GFR, Estimated: >60 mL/min (ref >60)
Glucose, Bld: 88 mg/dL (ref 70–99)
Potassium: 3.4 mmol/L — ABNORMAL LOW (ref 3.5–5.1)
Sodium: 134 mmol/L — ABNORMAL LOW (ref 135–145)

## 2023-03-31 MED ORDER — POTASSIUM CHLORIDE CRYS ER 20 MEQ PO TBCR: 20.0000 meq | EXTENDED_RELEASE_TABLET | Freq: Every day | ORAL | 0 refills | Status: DC

## 2023-03-31 NOTE — Telephone Encounter (Signed)
 Pt came back into office. Informed her of recommendations. Pt voiced understanding. Informed pt to go to [facility] to have labs and stool studies done.

## 2023-03-31 NOTE — Telephone Encounter (Signed)
LMOM for pt to call office. Also sent a MyChart message.

## 2023-03-31 NOTE — Telephone Encounter (Signed)
 We can check a BMP for her to ensure potassium has remained normal.  Please arrange.  Dx: Diarrhea  She needs to complete stool studies ASAP. I hate the lab was closed. I assume this was related to inclement weather.  Unfortunately, there is not another way to rule out an infectious diarrhea.  As she has used the collection container from the lab, she would likely need to go back to the lab to get a new one.  She will need to continue to drink plenty of fluids that contain electrolytes and eat a bland diet even though she is having diarrhea.  She should not stop eating/drinking to limit diarrhea.  If symptoms are severe, worsening cramping, feeling lightheaded, dizzy, she should proceed to the emergency room.

## 2023-03-31 NOTE — Telephone Encounter (Signed)
 Pt came in stating that she had been to [facility] 3 times to drop stool sample off and they have been closed. She states that she can not keep any food in. She is having uncontrollable diarrhea. She can't eat or drink anything without it going right threw her. She states she is starting to have cramps in her legs and hands. Please advise.

## 2023-04-01 ENCOUNTER — Other Ambulatory Visit (HOSPITAL_COMMUNITY)
Admission: RE | Admit: 2023-04-01 | Discharge: 2023-04-01 | Disposition: A | Discharge status: Home / Self Care | Payer: Medicare Other | Source: Ambulatory Visit | Attending: Gastroenterology | Admitting: Gastroenterology

## 2023-04-01 DIAGNOSIS — R197 Diarrhea, unspecified: Secondary | ICD-10-CM | POA: Insufficient documentation

## 2023-04-01 DIAGNOSIS — A041 Enterotoxigenic Escherichia coli infection: Secondary | ICD-10-CM | POA: Diagnosis not present

## 2023-04-01 DIAGNOSIS — A040 Enteropathogenic Escherichia coli infection: Secondary | ICD-10-CM | POA: Insufficient documentation

## 2023-04-01 LAB — C DIFFICILE QUICK SCREEN W PCR REFLEX
C Diff antigen: NEGATIVE
C Diff interpretation: NOT DETECTED
C Diff toxin: NEGATIVE

## 2023-04-02 ENCOUNTER — Other Ambulatory Visit: Payer: Self-pay | Admitting: Gastroenterology

## 2023-04-02 DIAGNOSIS — A041 Enterotoxigenic Escherichia coli infection: Secondary | ICD-10-CM

## 2023-04-02 DIAGNOSIS — A040 Enteropathogenic Escherichia coli infection: Secondary | ICD-10-CM

## 2023-04-02 LAB — GASTROINTESTINAL PANEL BY PCR, STOOL (REPLACES STOOL CULTURE)

## 2023-04-02 MED ORDER — AZITHROMYCIN 500 MG PO TABS: 500.0000 mg | ORAL_TABLET | Freq: Every day | ORAL | 0 refills | Status: AC

## 2023-04-02 MED ORDER — AZITHROMYCIN 500 MG PO TABS: 500.0000 mg | ORAL_TABLET | Freq: Every day | ORAL | 0 refills | Status: DC

## 2023-04-09 ENCOUNTER — Other Ambulatory Visit: Payer: Self-pay | Admitting: Gastroenterology

## 2023-04-09 DIAGNOSIS — R197 Diarrhea, unspecified: Secondary | ICD-10-CM

## 2023-04-09 DIAGNOSIS — E876 Hypokalemia: Secondary | ICD-10-CM

## 2023-05-04 NOTE — Progress Notes (Unsigned)
 Office Visit Note  Patient: Angel Price             Date of Birth: 11/10/1965           MRN: [id number]             PCP: Benetta Spar, MD Referring: Benetta Spar* Visit Date: 05/05/2023   Subjective:  No chief complaint on file.   History of Present Illness: Angel Price is a 58 y.o. female here for follow up ***   Previous HPI 10/31/22 Angel Price is a 58 y.o. female here for follow up for seropositive RA on hydroxychloroquine 300 mg daily.  Since her last visit she is doing worse significant increase in joint pains mostly the hands and knees.  She sees visible swelling affecting her hands most days somewhat worse on the left side.  Knee pain and swelling is also worse in the left side.  Not taking any specific medications for the symptoms.  She saw the emergency department at the end of February for radiating left arm pain thought consistent for neuritis treated with a short course of oral prednisone and oxycodone.  She did not recall any new preceding illness or injury and has been taking hydroxychloroquine consistently.  She is smoking cigarettes.   Previous HPI 05/02/2022 Angel Price is a 58 y.o. female here for follow up for seropositive RA on hydroxychloroquine 300 mg daily.  Since her last visit she has been doing pretty well not having as much persistent hip or knee pain compared to what she felt in December.  Has not experienced any major flareups with peripheral joint swelling.  She denies any serious infections since her last visit.   Previous HPI 03/06/22 Angel Price is a 58 y.o. female here for follow up for seropositive rheumatoid arthritis (RF+, ANA+) on HCQ 300 mg daily. She is experiencing increased pain in bilateral hips and knees with a lot of associated joint popping and feeling of weakness or instability in her legs. This started since about a month ago and she does not recall any events, illness, or medical changes at the  time. Not seeing visible swelling in the areas. She has not fallen. She reports normal feeling in her legs.  She is not taking any specific treatment for these symptoms besides her normal arthritis medication and pain medication. She has not taken any blood pressure medications today and says she never takes these until eating and plans to at lunch.     Previous HPI 12/08/20 Angel Price is a 58 y.o. female here for lupus. 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 Previous serology showed positive ANA and RF. Past imaging showing degenerative changes in lumbar spine.  She has not followed up with any rheumatologist for many years. She recalls use of NSAIDs and steroids and thinks plaquenil sounds familiar but not sure of an other long term lupus treatment. 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  She is currently following with cardiology for angina loss of consciousness no obstructive CAD on extensive work-up including coronary angiography attributed to suspected microvascular dysfunction. She is required abdominal surgeries for gastrointestinal bleeding with dieulafoy lesion of duodenum.  Abdominal mesh surgery for repair of incisional hernia was complicated with infection attributed in part to continue nicotine abuse.  No Rheumatology ROS completed.   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 heart with stable angina pectoris (HCC) 06/25/2019   Acute respiratory failure with hypoxia (HCC) 05/20/2019   Essential hypertension 05/20/2019   COVID-19 05/20/2019   Syncope and collapse 05/20/2019   Metabolic acidosis 05/16/2019   Intractable vomiting with nausea 02/23/2019   Cocaine abuse (HCC)    Colitis 11/11/2018   Hemorrhoidal skin tag    Folate deficiency 05/11/2017   Abdominal wall fistula    Cellulitis of abdominal wall 01/02/2017   Acute left hemiparesis (HCC) 12/05/2016   Malingering 12/05/2016   Weakness of left lower extremity 12/02/2016   CVA (cerebral vascular accident) (HCC) 11/23/2016   Bright red rectal bleeding 08/22/2016   Hypotension due to blood loss    Acute GI bleeding 05/24/2016   Dieulafoy lesion of duodenum    History of Billroth II operation    Gastrointestinal hemorrhage 05/22/2016   Absolute anemia    Acute pancreatitis 10/01/2015   Alcohol abuse with intoxication (HCC)    Left-sided weakness    Psychosomatic factor in physical condition    Upper GI bleed    Diverticulosis of colon with hemorrhage    Chronic wound infection of abdomen 12/22/2014   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   HTN (hypertension) 12/22/2014   Ataxia 11/01/2014   Hemorrhoids 04/26/2014   Lung nodule 04/26/2014   Diverticulosis    Gastritis    Hiatal hernia    Schatzki's ring    Acute blood loss anemia 04/25/2014   Hypokalemia 04/25/2014   Hematemesis with nausea 04/25/2014   Lung nodule < 6cm on CT 04/25/2014   Intractable nausea and vomiting 04/24/2014   Gastroparesis    Abdominal pain 04/01/2014   Gastroenteritis 12/10/2013   Chronic abdominal wound infection 08/16/2013   MDD (major depressive disorder), recurrent episode, severe (HCC) 06/27/2013   Wrist laceration 06/24/2013   Diarrhea  05/07/2013   Rectal bleeding 05/07/2013   Adrenal mass, left (HCC) 05/07/2013   Post-operative wound abscess 04/19/2013   Abdominal wall abscess 04/18/2013   Essential hypertension, benign 03/30/2013   History of cocaine abuse (HCC) 03/16/2013   History of schizoaffective disorder 03/16/2013   Bipolar disorder (HCC) 03/16/2013   Personality disorder (HCC) 03/16/2013   Tobacco use disorder 03/16/2013   Alcohol abuse 03/16/2013   Palpitations 03/16/2013   Poor vision 03/16/2013   History of gastric bypass 03/16/2013   Status post hysterectomy with oophorectomy 03/16/2013   Hypothyroid 03/16/2013   SLE (systemic lupus erythematosus) (HCC) 03/16/2013    Past Medical History:  Diagnosis Date   Abscess    soft tissue   Adrenal mass (HCC)    Alcohol abuse    Anemia    Anxiety    Blood transfusion without reported diagnosis    Chronic abdominal pain    Chronic wound infection of abdomen    Colon polyp    colonoscopy 04/2014   Depression    Discoid lupus erythematosus    Diverticulosis  colonoscopy 04/2014 moderat pan colonic   Drug-seeking behavior    Gastritis    EGD 05/2014   Gastroparesis 01/2014   GERD (gastroesophageal reflux disease)    Hemorrhoid    internal large   Hiatal hernia    History of Billroth II operation    Hypertension    Lung nodule    CT 02/2014 needs repeat 1 month   Lung nodule < 6cm on CT 04/25/2014   Lupus    Malingering    Migraine    Nausea and vomiting    chronic, recurrent   Pancreatitis    Schatzki's ring    patent per EGD 04/2014   Sickle cell trait (HCC)    Stroke (HCC)    Suicide attempt (HCC)    Thyroid disease 2000   overactive, radiation    Family History  Problem Relation Age of Onset   Drug abuse Mother    Lung cancer Father    Cancer Father        mets   Drug abuse Sister    Drug abuse Brother    Breast cancer Maternal Aunt    Bipolar disorder Maternal Aunt    Drug abuse Maternal Aunt    Colon cancer Maternal  Grandmother        late 14s, early 71s   Bipolar disorder Paternal Grandfather    Brain cancer Son    Schizophrenia Son    Cancer Son        brain   Bipolar disorder Cousin    Liver disease Neg Hx    Past Surgical History:  Procedure Laterality Date   ABDOMINAL HYSTERECTOMY  2013   [place]   ABDOMINAL HYSTERECTOMY     ABDOMINAL SURGERY     ADRENALECTOMY Right    AGILE CAPSULE N/A 01/05/2015   Procedure: AGILE CAPSULE;  Surgeon: Corbin Ade, MD;  [facility]: [facility];  Service: Endoscopy;  Laterality: N/A;  0700   BALLOON DILATION N/A 10/01/2021   Procedure: BALLOON DILATION;  Surgeon: Lanelle Bal, DO;  Location: [facility];  Service: Endoscopy;  Laterality: N/A;   Billroth II procedure      [place], first 2000, 2005/2006.   bilroth 2     BIOPSY  05/20/2013   Procedure: BIOPSIES OF ASCENDING AND SIGMOID COLON;  Surgeon: Corbin Ade, MD;  Location: [facility];  Service: Endoscopy;;   BIOPSY  04/26/2014   Procedure: BIOPSIES;  Surgeon: West Bali, MD;  Location: [facility];  Service: Endoscopy;;   BIOPSY  12/24/2018   Procedure: BIOPSY;  Surgeon: Corbin Ade, MD;  [facility]: [facility];  Service: Endoscopy;;  colon   CHOLECYSTECTOMY     COLONOSCOPY     in [place]   COLONOSCOPY WITH PROPOFOL N/A 05/20/2013   Dr.Rourk- inadequate prep, normal appearing rectum, grossly normal colon aside from pancolonic diverticula, normal terminal ileum bx= unremarkable colonic mucosa. Due for early interval 2016.    COLONOSCOPY WITH PROPOFOL N/A 04/26/2014   ZOX:WRUEAV ileum/one colon polyp removed/moderate pan-colonic diverticulosis/large internal hemorrhoids   COLONOSCOPY WITH PROPOFOL N/A 12/23/2014   Dr.Rourk- minimal internal hemorrhoids, pancolonic diverticulosis   COLONOSCOPY WITH PROPOFOL N/A 08/23/2016   Procedure: COLONOSCOPY WITH PROPOFOL;  Surgeon: West Bali, MD;  [facility]: [facility];  Service: Endoscopy;  Laterality: N/A;   COLONOSCOPY WITH PROPOFOL  N/A 12/24/2018   Pancolonic diverticulosis, normal TI, one 5 mm polyp in rectum (tubular adenoma). Somewhat friable hemorrhagic mucosa in area of IC valve/cecum, possibly  related to scope trauma s/p biopsy. Repeat in 7 years.   DEBRIDEMENT OF ABDOMINAL WALL ABSCESS N/A 02/08/2013   Procedure: DEBRIDEMENT OF ABDOMINAL WALL ABSCESS;  Surgeon: Dalia Heading, MD;  Location: [facility];  Service: General;  Laterality: N/A;   ESOPHAGOGASTRODUODENOSCOPY (EGD) WITH PROPOFOL N/A 05/20/2013   Dr.Rourk- s/p prior gastric surgery with normal esophagus, residual gastric mucosa and patent efferent limb   ESOPHAGOGASTRODUODENOSCOPY (EGD) WITH PROPOFOL N/A 02/03/2014   Dr. Jena Gauss:  s/p hemigastrectomy with retained gastric contents. Residual gastric mucosa and efferent limb appeared normal otherwise. Query gastroparesis.    ESOPHAGOGASTRODUODENOSCOPY (EGD) WITH PROPOFOL N/A 04/26/2014   WUJ:WJXBJYNW'G ring/small HH/mild non-erosive gasrtitis/normal anastomosis   ESOPHAGOGASTRODUODENOSCOPY (EGD) WITH PROPOFOL N/A 12/23/2014   Dr.Rourk- s/p prior hemigastrctomy, active oozing from anastomotic suture site, hemostasis achieved   ESOPHAGOGASTRODUODENOSCOPY (EGD) WITH PROPOFOL N/A 05/23/2016   Dr. 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    ESOPHAGOGASTRODUODENOSCOPY (EGD) WITH PROPOFOL N/A 05/11/2017   Procedure: ESOPHAGOGASTRODUODENOSCOPY (EGD) WITH PROPOFOL;  Surgeon: Corbin Ade, MD;  [facility];  Service: Endoscopy;  Laterality: N/A;   ESOPHAGOGASTRODUODENOSCOPY (EGD) WITH PROPOFOL N/A 06/07/2021   Procedure: ESOPHAGOGASTRODUODENOSCOPY (EGD) WITH PROPOFOL;  Surgeon: Corbin Ade, MD;  [facility];  Service: Endoscopy;  Laterality: N/A;  11:45am   ESOPHAGOGASTRODUODENOSCOPY (EGD) WITH PROPOFOL N/A 10/01/2021   Procedure: ESOPHAGOGASTRODUODENOSCOPY (EGD) WITH PROPOFOL;  Surgeon: Lanelle Bal,  DO;  [facility];  Service: Endoscopy;  Laterality: N/A;  3:00pm   EXCISION OF MESH  08/2019   HEMORRHOID SURGERY N/A 06/18/2017   Procedure: THREE COLUMN EXTENSIVE HEMORRHOIDECTOMY;  Surgeon: Lucretia Roers, MD;  Location: [facility];  Service: General;  Laterality: N/A;   HERNIA REPAIR     INCISION AND DRAINAGE ABSCESS N/A 01/06/2017   Procedure: INCISION AND DRAINAGE ABDOMINAL WALL ABSCESS;  Surgeon: Franky Macho, MD;  Location: [facility];  Service: General;  Laterality: N/A;   incisional hernial repair  09/05/2021   [place]   LEFT HEART CATH AND CORONARY ANGIOGRAPHY N/A 05/09/2020   Procedure: LEFT HEART CATH AND CORONARY ANGIOGRAPHY;  Surgeon: Elder Negus, MD;  [facility];  Service: Cardiovascular;  Laterality: N/A;   POLYPECTOMY  12/24/2018   Procedure: POLYPECTOMY;  Surgeon: Corbin Ade, MD;  [facility];  Service: Endoscopy;;  colon   tendon repar Right    wrist   WOUND EXPLORATION Right 06/24/2013   Procedure: exploration of traumatic wound right wrist;  Surgeon: Tami Ribas, MD;  Location: [facility] OR;  Service: Orthopedics;  Laterality: Right;   Social History   Social History Narrative   Caffeine- tea occass   Immunization History  Administered Date(s) Administered   Influenza,inj,Quad PF,6+ Mos 02/08/2013, 04/02/2014, 12/23/2014, 12/03/2016   Influenza-Unspecified 02/08/2013, 04/02/2014, 12/23/2014, 12/03/2016   Pneumococcal Polysaccharide-23 06/25/2013, 12/03/2016, 11/12/2018   Tdap 03/16/2013, 06/24/2013     Objective: Vital Signs: LMP  (LMP Unknown)    Physical Exam   Musculoskeletal Exam: ***  CDAI Exam: CDAI Score: -- Patient Global: --; Provider Global: -- Swollen: --; Tender: -- Joint Exam 05/05/2023   No joint exam has been documented for this visit   There is currently no information documented on the homunculus. Go to the Rheumatology activity and complete the homunculus joint  exam.  Investigation: No additional findings.  Imaging: No results found.  Recent Labs: Lab Results  Component Value Date  WBC 4.4 03/16/2023   HGB 10.8 (L) 03/16/2023   PLT 369 03/16/2023   NA 134 (L) 03/31/2023   K 3.4 (L) 03/31/2023   CL 102 03/31/2023   CO2 25 03/31/2023   GLUCOSE 88 03/31/2023   BUN 9 03/31/2023   CREATININE 0.51 03/31/2023   BILITOT 0.7 03/14/2023   ALKPHOS 67 03/14/2023   AST 19 03/14/2023   ALT 23 03/14/2023   PROT 7.0 03/14/2023   ALBUMIN 3.7 03/14/2023   CALCIUM 9.3 03/31/2023   GFRAA 119 05/05/2020    Speciality Comments: PLQ Eye Exam 08/10/2021 [facility] f/u 1 month  Procedures:  No procedures performed Allergies: Metoclopramide, Codeine, and Reglan [metoclopramide]   Assessment / Plan:     Visit Diagnoses: No diagnosis found.  ***  Orders: No orders of the defined types were placed in this encounter.  No orders of the defined types were placed in this encounter.    Follow-Up Instructions: No follow-ups on file.   Fuller Plan, MD  Note - This record has been created using [employer].  Chart creation errors have been sought, but may not always  have been located. Such creation errors do not reflect on  the standard of medical care.

## 2023-05-05 ENCOUNTER — Encounter: Payer: Self-pay | Admitting: Internal Medicine

## 2023-05-05 ENCOUNTER — Ambulatory Visit: Payer: Medicare Other | Attending: Internal Medicine | Admitting: Internal Medicine

## 2023-05-05 VITALS — BP 105/73 | HR 60 | Resp 14 | Ht 63.0 in | Wt 153.0 lb

## 2023-05-05 DIAGNOSIS — Z79899 Other long term (current) drug therapy: Secondary | ICD-10-CM | POA: Diagnosis not present

## 2023-05-05 DIAGNOSIS — M1712 Unilateral primary osteoarthritis, left knee: Secondary | ICD-10-CM | POA: Insufficient documentation

## 2023-05-05 DIAGNOSIS — M059 Rheumatoid arthritis with rheumatoid factor, unspecified: Secondary | ICD-10-CM

## 2023-05-05 MED ORDER — PREDNISONE 5 MG PO TABS: ORAL_TABLET | ORAL | 0 refills | Status: AC

## 2023-05-05 MED ORDER — HYDROXYCHLOROQUINE SULFATE 200 MG PO TABS
300.0000 mg | ORAL_TABLET | Freq: Every day | ORAL | 1 refills | Status: DC
Start: 2023-05-05 — End: 2023-11-03

## 2023-05-06 LAB — CBC WITH DIFFERENTIAL/PLATELET
Absolute Lymphocytes: 2150 {cells}/uL (ref 850–3900)
Absolute Monocytes: 465 {cells}/uL (ref 200–950)
Basophils Absolute: 20 {cells}/uL (ref 0–200)
Basophils Relative: 0.4 %
Eosinophils Absolute: 120 {cells}/uL (ref 15–500)
Eosinophils Relative: 2.4 %
HCT: 39.7 % (ref 35.0–45.0)
Hemoglobin: 12.7 g/dL (ref 11.7–15.5)
MCH: 25.7 pg — ABNORMAL LOW (ref 27.0–33.0)
MCHC: 32 g/dL (ref 32.0–36.0)
MCV: 80.2 fL (ref 80.0–100.0)
MPV: 9.9 fL (ref 7.5–12.5)
Monocytes Relative: 9.3 %
Neutro Abs: 2245 {cells}/uL (ref 1500–7800)
Neutrophils Relative %: 44.9 %
Platelets: 358 10*3/uL (ref 140–400)
RBC: 4.95 10*6/uL (ref 3.80–5.10)
RDW: 16.3 % — ABNORMAL HIGH (ref 11.0–15.0)
Total Lymphocyte: 43 %
WBC: 5 10*3/uL (ref 3.8–10.8)

## 2023-05-06 LAB — C-REACTIVE PROTEIN: CRP: <3 mg/L (ref <8.0)

## 2023-07-08 ENCOUNTER — Encounter (HOSPITAL_COMMUNITY): Payer: Self-pay

## 2023-07-08 ENCOUNTER — Emergency Department (HOSPITAL_COMMUNITY): Admission: EM | Admit: 2023-07-08 | Discharge: 2023-07-08 | Disposition: A | Discharge status: Home / Self Care

## 2023-07-08 ENCOUNTER — Other Ambulatory Visit: Payer: Self-pay

## 2023-07-08 DIAGNOSIS — Z7982 Long term (current) use of aspirin: Secondary | ICD-10-CM | POA: Insufficient documentation

## 2023-07-08 DIAGNOSIS — R202 Paresthesia of skin: Secondary | ICD-10-CM | POA: Diagnosis not present

## 2023-07-08 DIAGNOSIS — R531 Weakness: Secondary | ICD-10-CM | POA: Insufficient documentation

## 2023-07-08 DIAGNOSIS — I10 Essential (primary) hypertension: Secondary | ICD-10-CM | POA: Insufficient documentation

## 2023-07-08 DIAGNOSIS — Z79899 Other long term (current) drug therapy: Secondary | ICD-10-CM | POA: Insufficient documentation

## 2023-07-08 LAB — CBC
HCT: 37.3 % (ref 36.0–46.0)
Hemoglobin: 13.3 g/dL (ref 12.0–15.0)
MCH: 26 pg (ref 26.0–34.0)
MCHC: 35.7 g/dL (ref 30.0–36.0)
MCV: 73 fL — ABNORMAL LOW (ref 80.0–100.0)
Platelets: 375 10*3/uL (ref 150–400)
RBC: 5.11 MIL/uL (ref 3.87–5.11)
RDW: 16.2 % — ABNORMAL HIGH (ref 11.5–15.5)
WBC: 6.9 10*3/uL (ref 4.0–10.5)
nRBC: 0 % (ref 0.0–0.2)

## 2023-07-08 LAB — BASIC METABOLIC PANEL WITH GFR
Anion gap: 12 (ref 5–15)
BUN: 10 mg/dL (ref 6–20)
CO2: 20 mmol/L — ABNORMAL LOW (ref 22–32)
Calcium: 9.4 mg/dL (ref 8.9–10.3)
Chloride: 101 mmol/L (ref 98–111)
Creatinine, Ser: 0.54 mg/dL (ref 0.44–1.00)
GFR, Estimated: >60 mL/min (ref >60)
Glucose, Bld: 95 mg/dL (ref 70–99)
Potassium: 2.4 mmol/L — CL (ref 3.5–5.1)
Sodium: 133 mmol/L — ABNORMAL LOW (ref 135–145)

## 2023-07-08 LAB — TROPONIN I (HIGH SENSITIVITY)
Troponin I (High Sensitivity): 3 ng/L (ref <18)
Troponin I (High Sensitivity): 4 ng/L (ref <18)

## 2023-07-08 LAB — CBG MONITORING, ED: Glucose-Capillary: 108 mg/dL — ABNORMAL HIGH (ref 70–99)

## 2023-07-08 MED ORDER — DIPHENHYDRAMINE HCL 50 MG/ML IJ SOLN
12.5000 mg | Freq: Once | INTRAMUSCULAR | Status: AC
  Administered 2023-07-08: 12.5 mg via INTRAVENOUS
  Filled 2023-07-08: qty 1

## 2023-07-08 MED ORDER — LACTATED RINGERS IV BOLUS
1000.0000 mL | Freq: Once | INTRAVENOUS | Status: AC
Start: 2023-07-08 — End: 2023-07-08
  Administered 2023-07-08: 1000 mL via INTRAVENOUS

## 2023-07-08 MED ORDER — MECLIZINE HCL 12.5 MG PO TABS
25.0000 mg | ORAL_TABLET | Freq: Once | ORAL | Status: AC
  Administered 2023-07-08: 25 mg via ORAL
  Filled 2023-07-08: qty 2

## 2023-07-08 MED ORDER — PROCHLORPERAZINE EDISYLATE 10 MG/2ML IJ SOLN
5.0000 mg | Freq: Once | INTRAMUSCULAR | Status: AC
  Administered 2023-07-08: 5 mg via INTRAVENOUS
  Filled 2023-07-08: qty 2

## 2023-07-08 NOTE — ED Provider Notes (Signed)
 [place] EMERGENCY DEPARTMENT AT [facility] Provider Note   CSN: [id number] Arrival date & time: 07/08/23  1636     History {Add pertinent medical, surgical, social history, OB history to HPI:1} Chief Complaint  Patient presents with   Hypotension    SELENIA MIHOK is a 58 y.o. female.  58 year old female with past medical history of CVA with left-sided deficits that have since resolved as well as hypertension and hyperlipidemia presenting to the emergency department today with concern for left-sided deficits.  The patient has been having weakness, numbness, and tingling in her left arm and leg now for the past 4 days.  She states that she does seem to be having some difficulty getting her words out.  She states that she has a mild headache with this.  Denies any fevers.  She denies any other infectious symptoms.  She states that she did have left-sided deficits with her previous stroke but those resolved.  She states she is also having some pain in her left arm.  She came to the ER today due to ongoing symptoms.        Home Medications Prior to Admission medications   Medication Sig Start Date End Date Taking? Authorizing Provider  amLODipine  (NORVASC ) 10 MG tablet Take 10 mg by mouth daily. 05/18/21   [provider]  aspirin  EC 81 MG EC tablet Take 1 tablet (81 mg total) by mouth daily. 05/19/19   Elgergawy, Ardia Kraft, MD  atenolol  (TENORMIN ) 50 MG tablet Take 50 mg by mouth daily. 05/18/21   [provider]  Cholecalciferol  50 MCG (2000 UT) CAPS Take 1 tablet by mouth daily.    [provider]  clopidogrel  (PLAVIX ) 75 MG tablet Take 75 mg by mouth daily. 05/25/22   [provider]  cyclobenzaprine  (FLEXERIL ) 10 MG tablet Take 10 mg by mouth every 8 (eight) hours as needed. 02/06/23   [provider]  dicyclomine  (BENTYL ) 20 MG tablet Take 20 mg by mouth every 6 (six) hours as needed. 10/24/22   [provider]  diltiazem   (CARDIZEM  CD) 240 MG 24 hr capsule     [provider]  DULoxetine  (CYMBALTA ) 30 MG capsule Take 30 mg by mouth 2 (two) times daily. 08/14/21   [provider]  escitalopram  (LEXAPRO ) 20 MG tablet Take 1 tablet by mouth daily.    [provider]  gabapentin  (NEURONTIN ) 100 MG capsule Take 100 mg by mouth 3 (three) times daily. 03/03/23   [provider]  hydroxychloroquine  (PLAQUENIL ) 200 MG tablet Take 1.5 tablets (300 mg total) by mouth daily. 05/05/23   Rice, Haig Levan, MD  ibuprofen  (ADVIL ) 800 MG tablet Take 800 mg by mouth 2 (two) times daily as needed. 04/27/23   [provider]  isosorbide  mononitrate (IMDUR ) 60 MG 24 hr tablet TAKE 1/2 TABLET BY MOUTH DAILY 06/25/21   Patwardhan, Kaye Parsons, MD  lisinopril -hydrochlorothiazide  (ZESTORETIC ) 20-12.5 MG tablet Take 1 tablet by mouth every morning. Patient not taking: Reported on 03/21/2023 10/31/20   [provider]  losartan-hydrochlorothiazide  (HYZAAR) 100-25 MG tablet Take 1 tablet by mouth daily. Patient not taking: Reported on 05/05/2023 03/14/23   [provider]  metoprolol  tartrate (LOPRESSOR ) 50 MG tablet Take by mouth. 01/06/20   [provider]  mirtazapine  (REMERON ) 15 MG tablet Take 15 mg by mouth at bedtime as needed. 08/10/21   [provider]  nitroGLYCERIN  (NITROSTAT ) 0.4 MG SL tablet Place 1 tablet (0.4 mg total) under the  tongue every 5 (five) minutes as needed for chest pain. 12/29/19 03/21/23  Patwardhan, Kaye Parsons, MD  pantoprazole  (PROTONIX ) 40 MG tablet Take 1 tablet by mouth daily.    [provider]  potassium chloride  SA (KLOR-CON  M) 20 MEQ tablet Take 1 tablet (20 mEq total) by mouth daily for 4 days. 03/31/23 04/04/23  Evander Hills, PA-C  prochlorperazine  (COMPAZINE ) 10 MG tablet     [provider]  promethazine  (PHENERGAN ) 12.5 MG tablet Take 12.5 mg by mouth every 6 (six) hours as needed for nausea or vomiting.  12/18/19    [provider]  Rimegepant Sulfate  (NURTEC) 75 MG TBDP Take 75 mg by mouth as needed. 09/12/21   Dala Dublin, MD  rosuvastatin  (CRESTOR ) 20 MG tablet Take 1 tablet (20 mg total) by mouth daily. 06/25/19 11/06/28  Patwardhan, Kaye Parsons, MD  SUMAtriptan  (IMITREX ) 50 MG tablet TAKE 1 TABLET BY MOUTH EVERY 2 HOURS AS NEEDED FOR MIGRAINES MAY REPEAT IN 2 HOURS Patient not taking: Reported on 05/05/2023    [provider]  thiamine  (VITAMIN B1) 100 MG tablet Take 100 mg by mouth daily. 04/26/23   [provider]  thiamine  100 MG tablet Take 100 mg by mouth daily. Patient not taking: Reported on 05/05/2023 05/18/21   [provider]  tiZANidine (ZANAFLEX) 2 MG tablet Take 2 mg by mouth at bedtime. 03/03/23   [provider]  zolpidem  (AMBIEN  CR) 12.5 MG CR tablet Take by mouth. 08/03/13   [provider]      Allergies    Metoclopramide , Codeine, and Reglan  [metoclopramide ]    Review of Systems   Review of Systems  Neurological:  Positive for weakness and numbness.  All other systems reviewed and are negative.   Physical Exam Updated Vital Signs BP (!) 113/90   Pulse 76   Temp (!) 97.5 F (36.4 C) (Oral)   Resp 18   Ht 5\' 3"  (1.6 m)   Wt 69.4 kg   LMP  (LMP Unknown)   SpO2 98%   BMI 27.10 kg/m  Physical Exam Vitals and nursing note reviewed.   Gen: NAD Eyes: PERRL, EOMI HEENT: no oropharyngeal swelling Neck: trachea midline Resp: clear to auscultation bilaterally Card: RRR, no murmurs, rubs, or gallops Abd: nontender, nondistended Extremities: no calf tenderness, no edema Vascular: 2+ radial pulses bilaterally, 2+ DP pulses bilaterally Neuro: NIH stroke scale of 4 for mild expressive aphasia, mild left upper extremity and left lower extremity drift, and diminished sensation on the left side Skin: no rashes Psyc: acting appropriately   ED Results / Procedures / Treatments   Labs (all labs ordered are listed, but only abnormal  results are displayed) Labs Reviewed  CBG MONITORING, ED - Abnormal; Notable for the following components:      Result Value   Glucose-Capillary 108 (*)    All other components within normal limits  BASIC METABOLIC PANEL WITH GFR  CBC  URINALYSIS, ROUTINE W REFLEX MICROSCOPIC  TROPONIN I (HIGH SENSITIVITY)    EKG EKG Interpretation Date/Time:  Tuesday July 08 2023 17:17:32 EDT Ventricular Rate:  82 PR Interval:  164 QRS Duration:  78 QT Interval:  394 QTC Calculation: 460 R Axis:   62  Text Interpretation: Normal sinus rhythm Nonspecific ST and T wave abnormality Prolonged QT Abnormal ECG When compared with ECG of 14-Mar-2023 11:07, Nonspecific T wave abnormality, improved in Lateral leads Confirmed by Abner Hoffman [phone]) on 07/08/2023 5:24:34 PM  Radiology No results found.  Procedures  Procedures  {Document cardiac monitor, telemetry assessment procedure when appropriate:1}  Medications Ordered in ED Medications  meclizine  (ANTIVERT ) tablet 25 mg (has no administration in time range)  lactated ringers  bolus 1,000 mL (has no administration in time range)  prochlorperazine  (COMPAZINE ) injection 5 mg (has no administration in time range)  diphenhydrAMINE  (BENADRYL ) injection 12.5 mg (has no administration in time range)    ED Course/ Medical Decision Making/ A&P   {   Click here for ABCD2, HEART and other calculatorsREFRESH Note before signing :1}                              Medical Decision Making 58 year old female with past medical history of rheumatoid arthritis and CVA in the past with left-sided deficits that have since resolved presenting to the emergency department today with left-sided weakness.  Her symptoms have been going now for 4 days so she is clearly outside the window for acute neurointervention at this time.  Will obtain a CT angiogram of her head and her neck with and without contrast to evaluate for acute hemorrhage or large vessel occlusion.  Will give  her Compazine  and Benadryl  as she is reporting mild headache in the event that this is due to complex migraine.  Will also give her meclizine  for dizziness.  Will reevaluate for ultimate disposition.  Amount and/or Complexity of Data Reviewed Labs: ordered. Radiology: ordered.  Risk Prescription drug management.   ***  {Document critical care time when appropriate:1} {Document review of labs and clinical decision tools ie heart score, Chads2Vasc2 etc:1}  {Document your independent review of radiology images, and any outside records:1} {Document your discussion with family members, caretakers, and with consultants:1} {Document social determinants of health affecting pt's care:1} {Document your decision making why or why not admission, treatments were needed:1} Final Clinical Impression(s) / ED Diagnoses Final diagnoses:  None    Rx / DC Orders ED Discharge Orders     None

## 2023-07-08 NOTE — ED Triage Notes (Signed)
 Pt arrived via POV c/o dizziness X 4 days, left arm tingling, and hypotension earlier today at home. Pt does have Hx of stroke. Pt presents in Triage with left side weakness in upper and lower extremities and slurred speech. Pt reports LKW 4 days ago.

## 2023-07-08 NOTE — ED Notes (Signed)
 Pt states she wants to sign out AMA because her ride is here, pt does not want to wait to speak to EDP.

## 2023-07-12 ENCOUNTER — Other Ambulatory Visit: Payer: Self-pay

## 2023-07-12 ENCOUNTER — Emergency Department (HOSPITAL_COMMUNITY)

## 2023-07-12 ENCOUNTER — Emergency Department (HOSPITAL_COMMUNITY)
Admission: EM | Admit: 2023-07-12 | Discharge: 2023-07-12 | Disposition: A | Discharge status: Home / Self Care | Attending: Emergency Medicine | Admitting: Emergency Medicine

## 2023-07-12 DIAGNOSIS — R200 Anesthesia of skin: Secondary | ICD-10-CM | POA: Insufficient documentation

## 2023-07-12 DIAGNOSIS — Z7982 Long term (current) use of aspirin: Secondary | ICD-10-CM | POA: Diagnosis not present

## 2023-07-12 DIAGNOSIS — I2510 Atherosclerotic heart disease of native coronary artery without angina pectoris: Secondary | ICD-10-CM | POA: Insufficient documentation

## 2023-07-12 DIAGNOSIS — R0789 Other chest pain: Secondary | ICD-10-CM | POA: Insufficient documentation

## 2023-07-12 DIAGNOSIS — R079 Chest pain, unspecified: Secondary | ICD-10-CM

## 2023-07-12 DIAGNOSIS — I288 Other diseases of pulmonary vessels: Secondary | ICD-10-CM | POA: Diagnosis not present

## 2023-07-12 LAB — COMPREHENSIVE METABOLIC PANEL WITH GFR
ALT: 25 U/L (ref 0–44)
AST: 20 U/L (ref 15–41)
Albumin: 3.6 g/dL (ref 3.5–5.0)
Alkaline Phosphatase: 74 U/L (ref 38–126)
Anion gap: 8 (ref 5–15)
BUN: 9 mg/dL (ref 6–20)
CO2: 26 mmol/L (ref 22–32)
Calcium: 9.1 mg/dL (ref 8.9–10.3)
Chloride: 105 mmol/L (ref 98–111)
Creatinine, Ser: 0.59 mg/dL (ref 0.44–1.00)
GFR, Estimated: >60 mL/min (ref >60)
Glucose, Bld: 96 mg/dL (ref 70–99)
Potassium: 3.3 mmol/L — ABNORMAL LOW (ref 3.5–5.1)
Sodium: 139 mmol/L (ref 135–145)
Total Bilirubin: 0.3 mg/dL (ref 0.0–1.2)
Total Protein: 6.8 g/dL (ref 6.5–8.1)

## 2023-07-12 LAB — CBC WITH DIFFERENTIAL/PLATELET
Abs Immature Granulocytes: 0.01 10*3/uL (ref 0.00–0.07)
Basophils Absolute: 0 10*3/uL (ref 0.0–0.1)
Basophils Relative: 0 %
Eosinophils Absolute: 0.2 10*3/uL (ref 0.0–0.5)
Eosinophils Relative: 3 %
HCT: 31.8 % — ABNORMAL LOW (ref 36.0–46.0)
Hemoglobin: 11.5 g/dL — ABNORMAL LOW (ref 12.0–15.0)
Immature Granulocytes: 0 %
Lymphocytes Relative: 50 %
Lymphs Abs: 3.5 10*3/uL (ref 0.7–4.0)
MCH: 26.4 pg (ref 26.0–34.0)
MCHC: 36.2 g/dL — ABNORMAL HIGH (ref 30.0–36.0)
MCV: 73.1 fL — ABNORMAL LOW (ref 80.0–100.0)
Monocytes Absolute: 0.6 10*3/uL (ref 0.1–1.0)
Monocytes Relative: 9 %
Neutro Abs: 2.6 10*3/uL (ref 1.7–7.7)
Neutrophils Relative %: 38 %
Platelets: 313 10*3/uL (ref 150–400)
RBC: 4.35 MIL/uL (ref 3.87–5.11)
RDW: 16.1 % — ABNORMAL HIGH (ref 11.5–15.5)
WBC: 7 10*3/uL (ref 4.0–10.5)
nRBC: 0 % (ref 0.0–0.2)

## 2023-07-12 LAB — LIPASE, BLOOD: Lipase: 24 U/L (ref 11–51)

## 2023-07-12 LAB — TROPONIN I (HIGH SENSITIVITY)
Troponin I (High Sensitivity): 4 ng/L (ref <18)
Troponin I (High Sensitivity): 5 ng/L (ref <18)

## 2023-07-12 MED ORDER — KETOROLAC TROMETHAMINE 15 MG/ML IJ SOLN
15.0000 mg | Freq: Once | INTRAMUSCULAR | Status: AC
  Administered 2023-07-12: 15 mg via INTRAVENOUS
  Filled 2023-07-12: qty 1

## 2023-07-12 MED ORDER — CYCLOBENZAPRINE HCL 5 MG PO TABS: 5.0000 mg | ORAL_TABLET | Freq: Two times a day (BID) | ORAL | 0 refills | Status: DC | PRN

## 2023-07-12 MED ORDER — MORPHINE SULFATE (PF) 4 MG/ML IV SOLN
4.0000 mg | Freq: Once | INTRAVENOUS | Status: AC
  Administered 2023-07-12: 4 mg via INTRAVENOUS
  Filled 2023-07-12: qty 1

## 2023-07-12 MED ORDER — IOHEXOL 350 MG/ML SOLN
100.0000 mL | Freq: Once | INTRAVENOUS | Status: AC | PRN
  Administered 2023-07-12: 100 mL via INTRAVENOUS

## 2023-07-12 MED ORDER — CYCLOBENZAPRINE HCL 10 MG PO TABS
5.0000 mg | ORAL_TABLET | Freq: Once | ORAL | Status: DC
  Filled 2023-07-12: qty 1

## 2023-07-12 NOTE — ED Triage Notes (Signed)
 Pt arrived POV c/o left side chest pain with left arm tingling that started this morning at 1030. No n/v noted.

## 2023-07-12 NOTE — ED Provider Notes (Signed)
 [place] EMERGENCY DEPARTMENT AT [facility] Provider Note   CSN: [id number] Arrival date & time: 07/12/23  1212     History  Chief Complaint  Patient presents with   Chest Pain    Angel Price is a 58 y.o. female, history of A-fib, cocaine abuse, CAD, gastroparesis, who presents to the ED secondary to severe left-sided chest pain, without radiation to the chest, it has been going on for the last few months.  She states that typically only last, for a few minutes, but today since 10:30 AM, it has been persistent.  She states that sharp and stabbing, dull left breast, and she has numbness down the left arm.  She does not have any nausea or vomiting associated with it.  Is not worse with range of motion.  Denies any recent cocaine use.  Has been compliant with her blood thinner.  States she does get a little bit short of breath when this occurs.  Pain has been persistent so that is why she decided to present to the ER today. Home Medications Prior to Admission medications   Medication Sig Start Date End Date Taking? Authorizing Provider  amLODipine  (NORVASC ) 10 MG tablet Take 10 mg by mouth daily. 05/18/21  Yes [provider]  aspirin  EC 81 MG EC tablet Take 1 tablet (81 mg total) by mouth daily. 05/19/19  Yes Elgergawy, Ardia Kraft, MD  Cholecalciferol  50 MCG (2000 UT) CAPS Take 1 tablet by mouth daily.   Yes [provider]  clopidogrel  (PLAVIX ) 75 MG tablet Take 75 mg by mouth daily. 05/25/22  Yes [provider]  cyclobenzaprine  (FLEXERIL ) 5 MG tablet Take 1 tablet (5 mg total) by mouth 2 (two) times daily as needed for muscle spasms. 07/12/23  Yes Siarra Gilkerson L, PA  dicyclomine  (BENTYL ) 20 MG tablet Take 20 mg by mouth every 6 (six) hours as needed for spasms. 10/24/22  Yes [provider]  DULoxetine  (CYMBALTA ) 30 MG capsule Take 30 mg by mouth 2 (two) times daily. 08/14/21  Yes [provider]  escitalopram  (LEXAPRO ) 20 MG tablet Take  20 mg by mouth daily.   Yes [provider]  gabapentin  (NEURONTIN ) 100 MG capsule Take 100 mg by mouth 3 (three) times daily. 03/03/23  Yes [provider]  hydroxychloroquine  (PLAQUENIL ) 200 MG tablet Take 1.5 tablets (300 mg total) by mouth daily. 05/05/23  Yes Rice, Haig Levan, MD  metoprolol  tartrate (LOPRESSOR ) 50 MG tablet Take 50 mg by mouth 2 (two) times daily. 01/06/20  Yes [provider]  mirtazapine  (REMERON ) 15 MG tablet Take 15 mg by mouth at bedtime as needed (for sleep). 08/10/21  Yes [provider]  nitroGLYCERIN  (NITROSTAT ) 0.4 MG SL tablet Place 1 tablet (0.4 mg total) under the tongue every 5 (five) minutes as needed for chest pain. 12/29/19 07/12/23 Yes Patwardhan, Manish J, MD  prochlorperazine  (COMPAZINE ) 10 MG tablet Take 10 mg by mouth every 6 (six) hours as needed for vomiting or nausea.   Yes [provider]  promethazine  (PHENERGAN ) 12.5 MG tablet Take 12.5 mg by mouth every 6 (six) hours as needed for nausea or vomiting.  12/18/19  Yes [provider]  Rimegepant Sulfate  (NURTEC) 75 MG TBDP Take 75 mg by mouth as needed. Patient taking differently: Take 75 mg by mouth as needed (migraines). 09/12/21  Yes Dala Dublin, MD  rosuvastatin  (CRESTOR ) 20 MG tablet Take 1 tablet (20 mg total) by mouth daily. 06/25/19 11/06/28 Yes Patwardhan, Manish  J, MD  thiamine  (VITAMIN B1) 100 MG tablet Take 100 mg by mouth daily. 04/26/23  Yes [provider]  tiZANidine (ZANAFLEX) 2 MG tablet Take 2 mg by mouth at bedtime. 03/03/23  Yes [provider]  zolpidem  (AMBIEN  CR) 12.5 MG CR tablet Take 12.5 mg by mouth at bedtime. 08/03/13  Yes [provider]      Allergies    Metoclopramide , Codeine, and Reglan  [metoclopramide ]    Review of Systems   Review of Systems  Respiratory:  Positive for shortness of breath.   Cardiovascular:  Positive for chest pain.  Gastrointestinal:  Negative for abdominal pain.     Physical Exam Updated Vital Signs BP (!) 122/92   Pulse 86   Temp 97.8 F (36.6 C) (Oral)   Resp 15   Ht 5\' 3"  (1.6 m)   Wt 68.5 kg   LMP  (LMP Unknown)   SpO2 98%   BMI 26.75 kg/m  Physical Exam Vitals and nursing note reviewed.  Constitutional:      General: She is not in acute distress.    Appearance: She is well-developed.  HENT:     Head: Normocephalic and atraumatic.  Eyes:     Conjunctiva/sclera: Conjunctivae normal.  Cardiovascular:     Rate and Rhythm: Normal rate and regular rhythm.     Heart sounds: No murmur heard. Pulmonary:     Effort: Pulmonary effort is normal. No respiratory distress.     Breath sounds: Normal breath sounds.  Chest:     Comments: Tenderness to palpation, of left chest wall, and left neck without midline tenderness.  W/o crepitus, ecchymosis, or warmth Abdominal:     Palpations: Abdomen is soft.     Tenderness: There is no abdominal tenderness.  Musculoskeletal:        General: No swelling.     Cervical back: Neck supple.  Skin:    General: Skin is warm and dry.     Capillary Refill: Capillary refill takes less than 2 seconds.  Neurological:     Mental Status: She is alert.  Psychiatric:        Mood and Affect: Mood normal.     ED Results / Procedures / Treatments   Labs (all labs ordered are listed, but only abnormal results are displayed) Labs Reviewed  CBC WITH DIFFERENTIAL/PLATELET - Abnormal; Notable for the following components:      Result Value   Hemoglobin 11.5 (*)    HCT 31.8 (*)    MCV 73.1 (*)    MCHC 36.2 (*)    RDW 16.1 (*)    All other components within normal limits  COMPREHENSIVE METABOLIC PANEL WITH GFR - Abnormal; Notable for the following components:   Potassium 3.3 (*)    All other components within normal limits  LIPASE, BLOOD  TROPONIN I (HIGH SENSITIVITY)  TROPONIN I (HIGH SENSITIVITY)    EKG None  Radiology CT Angio Chest/Abd/Pel for Dissection W and/or Wo Contrast Result Date:  07/12/2023 CLINICAL DATA:  Left chest pain and arm tingling. Clinical concern for aortic aneurysm. EXAM: CT ANGIOGRAPHY CHEST, ABDOMEN AND PELVIS TECHNIQUE: Non-contrast CT of the chest was initially obtained. Multidetector CT imaging through the chest, abdomen and pelvis was performed using the standard protocol during bolus administration of intravenous contrast. Multiplanar reconstructed images and MIPs were obtained and reviewed to evaluate the vascular anatomy. RADIATION DOSE REDUCTION: This exam was performed according to the departmental dose-optimization program which includes automated exposure control, adjustment of the mA  and/or kV according to patient size and/or use of iterative reconstruction technique. CONTRAST:  OMNIPAQUE  IOHEXOL  350 MG/ML SOLN COMPARISON:  Chest radiographs dated 07/12/2023 and 05/16/2022. Abdomen and pelvis CT dated 03/26/2023. FINDINGS: CTA CHEST FINDINGS Cardiovascular: Atheromatous calcifications, including the coronary arteries and aorta. Mildly enlarged central pulmonary arteries with a main pulmonary artery diameter of 3.3 cm. No aortic aneurysm or dissection. Mediastinum/Nodes: No enlarged mediastinal, hilar, or axillary lymph nodes. Thyroid  gland, trachea, and esophagus demonstrate no significant findings. Lungs/Pleura: Linear scarring in the right upper lobe with tiny associated calcification. Right middle lobe calcified granuloma. Mild bilateral dependent atelectasis, left greater than right. No pleural fluid. Musculoskeletal: Unremarkable liver.  Cholecystectomy clips. Review of the MIP images confirms the above findings. CTA ABDOMEN AND PELVIS FINDINGS VASCULAR Aorta: Mild atheromatous calcifications without aneurysm or dissection. Celiac: Patent without evidence of aneurysm, dissection, vasculitis or significant stenosis. SMA: Patent without evidence of aneurysm, dissection, vasculitis or significant stenosis. Renals: Both renal arteries are patent without  evidence of aneurysm, dissection, vasculitis, fibromuscular dysplasia or significant stenosis. IMA: Patent without evidence of aneurysm, dissection, vasculitis or significant stenosis. Inflow: Mild atheromatous calcifications without aneurysmal stenosis. Veins: No obvious venous abnormality within the limitations of this arterial phase study. Review of the MIP images confirms the above findings. NON-VASCULAR Hepatobiliary: Normal-appearing liver.  Cholecystectomy clips. Pancreas: Unremarkable. No pancreatic ductal dilatation or surrounding inflammatory changes. Spleen: Normal in size without focal abnormality. Adrenals/Urinary Tract: Stable previously demonstrated low-density left adrenal adenoma. Stable surgically absent right adrenal gland with associated surgical clips. Unremarkable kidneys, ureters and urinary bladder. Stomach/Bowel: Minimal colonic diverticulosis without evidence of diverticulitis. Poor distention of the proximal transverse colon limiting evaluation. Post gastric bypass changes. Focal Isrrael Fluckiger-bowel dilatation at the [place] bowel anastomosis is unchanged. Nonvisualized appendix. Lymphatic: No enlarged lymph nodes. Reproductive: Status post hysterectomy. No adnexal masses. Other: Anterior abdominal surgical scars. Musculoskeletal: Minimal lumbar spine degenerative changes. Review of the MIP images confirms the above findings. IMPRESSION: 1. No aortic aneurysm or dissection. 2. Mildly enlarged central pulmonary arteries, suggesting pulmonary arterial hypertension. 3. Stable left adrenal adenoma. 4. Minimal colonic diverticulosis without evidence of diverticulitis. 5.  Calcific coronary artery and aortic atherosclerosis. Aortic Atherosclerosis (ICD10-I70.0). Electronically Signed   By: Catherin Closs M.D.   On: 07/12/2023 16:59   DG Chest 2 View Result Date: 07/12/2023 CLINICAL DATA:  Chest pain EXAM: CHEST - 2 VIEW COMPARISON:  05/16/18 for FINDINGS: Heart size and mediastinal contours are normal.  There is no pleural fluid, interstitial edema or airspace consolidation. There are surgical clips identified within the right upper quadrant of the abdomen. The visualized osseous structures are unremarkable. IMPRESSION: No active cardiopulmonary disease. Electronically Signed   By: Kimberley Penman M.D.   On: 07/12/2023 13:03    Procedures Procedures    Medications Ordered in ED Medications  cyclobenzaprine  (FLEXERIL ) tablet 5 mg (5 mg Oral Patient Refused/Not Given 07/12/23 1623)  morphine  (PF) 4 MG/ML injection 4 mg (4 mg Intravenous Given 07/12/23 1421)  iohexol  (OMNIPAQUE ) 350 MG/ML injection 100 mL (100 mLs Intravenous Contrast Given 07/12/23 1439)  ketorolac  (TORADOL ) 15 MG/ML injection 15 mg (15 mg Intravenous Given 07/12/23 1623)    ED Course/ Medical Decision Making/ A&P             HEART Score: 5                    Medical Decision Making Patient is a 58 year old female, here for chest pain, going down her arm, it  has been going on for the last few hours, started around 1030, has numbness, the left arm,  is overall reassuring on exam.  Has chest wall tenderness to palpation, we will give a dose of morphine , obtain dissection study, given neurosymptoms, of numbness of the left arm, and will obtain troponins for further evaluation.  Not tachycardic, in no history of DVT or PE.  Possibly musculoskeletal, given tenderness to palpation on exam  Amount and/or Complexity of Data Reviewed Labs: ordered.    Details: 1 is within normal limits Radiology: ordered.    Details: CTA shows mildly enlarged central pulmonary arteries, no aortic aneurysm or dissection Discussion of management or test interpretation with external provider(s): Discussed with patient, she has a heart score of 5, her Rodrigo Clara is flat, and her pain is gone after the Toradol , she is overall well-appearing, has tenderness palpation of the chest wall, likely that to be musculoskeletal, but given her new pulmonary artery  hypertension, I do want her to follow-up with her cardiologist for further evaluation.  She voiced understanding, and states she will make an appointment on Monday.  We discussed her incidental findings, and she voiced understanding, able to ambulate, without hypoxia.   Risk Prescription drug management.   Final Clinical Impression(s) / ED Diagnoses Final diagnoses:  Chest pain, unspecified type  Dilation of pulmonary artery [facility])    Rx / DC Orders ED Discharge Orders          Ordered    cyclobenzaprine  (FLEXERIL ) 5 MG tablet  2 times daily PRN        07/12/23 1712              Yenni Carra Rolena Click, PA 07/12/23 1802    Deatra Face, MD 07/13/23 0745

## 2023-07-12 NOTE — Discharge Instructions (Addendum)
 Today your blood work was overall reassuring, but your CAT scan showed findings concerning for pulmonary hypertension.  You should follow-up with your cardiologist, for further evaluation, return to the ER if you have worsening chest pain or shortness of breath.

## 2023-07-15 ENCOUNTER — Other Ambulatory Visit (HOSPITAL_COMMUNITY)
Admission: RE | Admit: 2023-07-15 | Discharge: 2023-07-15 | Disposition: A | Discharge status: Home / Self Care | Source: Ambulatory Visit | Attending: Cardiology | Admitting: Cardiology

## 2023-07-15 ENCOUNTER — Encounter: Payer: Self-pay | Admitting: Cardiology

## 2023-07-15 ENCOUNTER — Ambulatory Visit: Attending: Cardiology | Admitting: Cardiology

## 2023-07-15 VITALS — BP 120/81 | HR 95 | Resp 16 | Ht 63.0 in | Wt 160.6 lb

## 2023-07-15 DIAGNOSIS — I10 Essential (primary) hypertension: Secondary | ICD-10-CM | POA: Diagnosis not present

## 2023-07-15 DIAGNOSIS — I25118 Atherosclerotic heart disease of native coronary artery with other forms of angina pectoris: Secondary | ICD-10-CM | POA: Insufficient documentation

## 2023-07-15 NOTE — Patient Instructions (Addendum)
 Medication Instructions:  STOP Plavix    *If you need a refill on your cardiac medications before your next appointment, please call your pharmacy*  Labs: STAT Troponin   Testing/Procedures: PET/CT  How to Prepare for Your Cardiac PET/CT Stress Test:  1. Please do not take these medications before your test:  ~Medications that may interfere with the cardiac pharmacological stress agent (ex. nitrates - including erectile dysfunction medications, isosorbide  mononitrate, tamulosin or beta-blockers) the day of the exam. (Erectile dysfunction medication should be held for at least 72 hrs prior to test) ~Theophylline containing medications for 12 hours. ~Dipyridamole 48 hours prior to the test. ~Your remaining medications may be taken with water .  2. Nothing to eat or drink, except water , 3 hours prior to arrival time.   ~ NO caffeine/decaffeinated products, or chocolate 12 hours prior to arrival.  3. NO perfume, cologne or lotion on chest or abdomen area.         - FEMALES - Please avoid wearing dresses to this appointment.  4. Total time is 1 to 2 hours; you may want to bring reading material for the waiting time.  Please report to Radiology at the [facility] Main Entrance 30 minutes early for your test. [address] [place], [place] [address]  Diabetic Preparation: - Hold oral medications. - You may take NPH and Lantus insulin. - Do not take Humalog or Humulin R (Regular Insulin) the day of your test. - Check blood sugars prior to leaving the house. - If able to eat breakfast prior to 3 hour fasting, you may take all medications, including your insulin, - Do not worry if you miss your breakfast dose of insulin - start at your next meal. - Patients who wear a continuous glucose monitor MUST remove the device prior to scanning.  IF YOU THINK YOU MAY BE PREGNANT, OR ARE NURSING PLEASE INFORM THE TECHNOLOGIST.  In preparation for your appointment, medication and  supplies will be purchased.  Appointment availability is limited, so if you need to cancel or reschedule, please call the Radiology Department at [phone] Maryan Smalling) [place] [phone] [facility])  24 hours in advance to avoid a cancellation fee of $100.00  What to Expect After you Arrive:  Once you arrive and check in for your appointment, you will be taken to a preparation room within the Radiology Department.  A technologist or Nurse will obtain your medical history, verify that you are correctly prepped for the exam, and explain the procedure.  Afterwards,  an IV will be started in your arm and electrodes will be placed on your skin for EKG monitoring during the stress portion of the exam. Then you will be escorted to the PET/CT scanner.  There, staff will get you positioned on the scanner and obtain a blood pressure and EKG.  During the exam, you will continue to be connected to the EKG and blood pressure machines.  A small, safe amount of a radioactive tracer will be injected in your IV to obtain a series of pictures of your heart along with an injection of a stress agent.    After your Exam:  It is recommended that you eat a meal and drink a caffeinated beverage to counter act any effects of the stress agent.  Drink plenty of fluids for the remainder of the day and urinate frequently for the first couple of hours after the exam.  Your doctor will inform you of your test results within 7-10 business days.  For more  information and frequently asked questions, please visit our website : [email or web address]  For questions about your test or how to prepare for your test, please call: Cardiac Imaging Nurse Navigators Office: [phone]    Follow-Up: At [facility], you and your health needs are our priority.  As part of our continuing mission to provide you with exceptional heart care, our providers are all part of one team.  This team includes your primary Cardiologist  (physician) and Advanced Practice Providers or APPs (Physician Assistants and Nurse Practitioners) who all work together to provide you with the care you need, when you need it.  Your next appointment:   3 month(s)  Provider:   Cody Das, MD

## 2023-07-15 NOTE — Progress Notes (Signed)
 Cardiology Office Note:  .   Date:  07/15/2023  ID:  Angel Price, DOB 1965-09-13, MRN [id number] PCP: Wyvonna Heidelberg, MD  [facility] Providers Cardiologist:  Fransico Ivy, MD PCP: Wyvonna Heidelberg, MD  Chief Complaint  Patient presents with   Coronary artery disease of native artery of native heart wi   New Patient (Initial Visit)     Angel Price is a 58 y.o. female with hypertension, prior h/o tobacco and alcohol dependence, coronary artery disease, suspected microvascular dysfunction  Patient has had multiple ER visits as well as hospitalization in the last year or so with various complaints, including chest pain.  At least 1 hospitalization has led to inpatient neurological evaluation with initial suspicions of stroke, but her presentation was subsequently deemed to be inconsistent with stroke with MRI brain showing no evidence of stroke.  There was thought about conversion disorder.  There has been mention of atrial fibrillation in the notes, but there is no EKG evidence or monitor evidence of A-fib.  With presumed prior stroke, she has continued dual antiplatelet therapy for at least a year or so, with aspirin  and Plavix .  More recently, she has been complaining of normal less constant chest pain with radiation to her left arm.  The symptoms have been going on for at least couple of years.  Prior evaluation, including coronary angiogram in 2022 showed normal epicardial vessels, albeit with slow flow.  She has quit cigarette or alcohol use, also denies any recreational drug use.  She reports that chest pain is getting worse, limiting any minimal activity.  However, she has had similar complaints even in the past.    Vitals:   07/15/23 1411  BP: 120/81  Pulse: 95  Resp: 16  SpO2: 98%      Review of Systems  Cardiovascular:  Positive for chest pain. Negative for dyspnea on exertion, leg swelling, palpitations and syncope.  Neurological:   Positive for numbness (Left arm).        Studies Reviewed: Aaron Aas        EKG 07/15/2023: Sinus rhythm with Premature supraventricular complexes Nonspecific ST and T wave abnormality When compared with ECG of 12-Jul-2023 12:28, Premature supraventricular complexes are now Present QT has shortened  Independently interpreted 02/2023-03/2023: Chol 219, TG 111, HDL 57, LDL 139 HbA1C 5.1% Hb 11.5 Cr 0.59 TSH 0.9  CTA chest 06/2023: 1. No aortic aneurysm or dissection. 2. Mildly enlarged central pulmonary arteries, suggesting pulmonary arterial hypertension. 3. Stable left adrenal adenoma. 4. Minimal colonic diverticulosis without evidence of diverticulitis. 5.  Calcific coronary artery and aortic atherosclerosis.  Echocardiogram 02/2023:  1. Left ventricular ejection fraction, by estimation, is 60 to 65%. The  left ventricle has normal function. The left ventricle has no regional  wall motion abnormalities. Left ventricular diastolic parameters were  normal.   2. Right ventricular systolic function is normal. The right ventricular  size is normal. There is normal pulmonary artery systolic pressure.   3. The mitral valve is abnormal. Trivial mitral valve regurgitation. No  evidence of mitral stenosis.   4. The aortic valve is tricuspid. Aortic valve regurgitation is not  visualized. No aortic stenosis is present.   5. Aortic dilatation noted. There is mild dilatation of the aortic root,  measuring 38 mm.   6. The inferior vena cava is normal in size with greater than 50%  respiratory variability, suggesting right atrial pressure of 3 mmHg.   Coronary angiography 04/2020: LM: Normal LAD: Prox  20% calcific disease        Diag 2 ostial 50%, prox 40% stenoses LCx: No significant disease RCA: Distal RCA focal 40% stenosis   TIMI III flow in all vessels in spite of IC NTG suggests possible microvascular dysfunction    Physical Exam Vitals and nursing note reviewed.   Constitutional:      General: She is not in acute distress. Neck:     Vascular: No JVD.  Cardiovascular:     Rate and Rhythm: Normal rate and regular rhythm.     Heart sounds: Normal heart sounds. No murmur heard. Pulmonary:     Effort: Pulmonary effort is normal.     Breath sounds: Normal breath sounds. No wheezing or rales.  Musculoskeletal:     Right lower leg: No edema.     Left lower leg: No edema.      VISIT DIAGNOSES:   ICD-10-CM   1. Coronary artery disease of native artery of native heart with stable angina pectoris (HCC)  I25.118 EKG 12-Lead    2. Essential hypertension  I10        Angel Price is a 58 y.o. female with hypertension, prior h/o tobacco and alcohol dependence, coronary artery disease, suspected microvascular dysfunction Assessment & Plan  Chest pain: Nonobstructive CAD, albeit with slow flow noted on coronary angiogram in 2022. Ongoing chest pain at rest without any EKG abnormality, LDL of proportionately to the degree of CAD. Will check troponin today, but suspicion for ACS is low.   Will check PET/CT stress test for objective evaluation of ischemia. Recommend aspirin  and statin for management of CAD, along with continued use of amlodipine , metoprolol  as antianginal, as well as antihypertensive agents.  I spent a lot of time extensively reviewing her chart, including several ER and hospitalization visits.  I do not see any definite evidence of stroke, or A-fib.  In the past, inpatient neurology evaluation has at her mentation was inconsistent with the stroke, and there has not been an MRI evidence of stroke.  As such, I do not think she needs dual antiplatelet therapy.  Therefore, I will discontinue Plavix .  Also, there is use mention of atrial fibrillation without any documented EKG on monitor.  She is currently not on anticoagulation anyway.  I do not think there is any indication for anticoagulation at this time.    Informed Consent   Shared  Decision Making/Informed Consent The risks [chest pain, shortness of breath, cardiac arrhythmias, dizziness, blood pressure fluctuations, myocardial infarction, stroke/transient ischemic attack, nausea, vomiting, allergic reaction, radiation exposure, metallic taste sensation and life-threatening complications (estimated to be 1 in 10,000)], benefits (risk stratification, diagnosing coronary artery disease, treatment guidance) and alternatives of a cardiac PET stress test were discussed in detail with Angel Price and she agrees to proceed.         F/u in 3 months  I spent 42 minutes in the care of Angel Price today including  extensive chart review of several prior ER and hospitalization visits between 2021 through 2024, discussing findings with the patient's, formulating plan for further investigation, and documenting in the encounter.   Signed, Cody Das, MD

## 2023-07-16 LAB — TROPONIN T: Troponin T (Highly Sensitive): 8 ng/L (ref 0–14)

## 2023-07-17 ENCOUNTER — Encounter: Payer: Self-pay | Admitting: Cardiology

## 2023-07-22 ENCOUNTER — Other Ambulatory Visit (HOSPITAL_COMMUNITY)

## 2023-07-28 ENCOUNTER — Other Ambulatory Visit (HOSPITAL_COMMUNITY): Payer: Self-pay | Admitting: Gerontology

## 2023-07-28 ENCOUNTER — Ambulatory Visit (HOSPITAL_COMMUNITY)
Admission: RE | Admit: 2023-07-28 | Discharge: 2023-07-28 | Disposition: A | Discharge status: Home / Self Care | Source: Ambulatory Visit | Attending: Gerontology | Admitting: Gerontology

## 2023-07-28 ENCOUNTER — Telehealth: Payer: Self-pay | Admitting: Cardiology

## 2023-07-28 DIAGNOSIS — M25551 Pain in right hip: Secondary | ICD-10-CM

## 2023-07-28 NOTE — Telephone Encounter (Signed)
  Ebony from Dr. Ronnald Coil office. Pt said, Dr. Filiberto Hug prescribed two new medications but pt don't remember the name of the medications. She said they closed during lunch time and to call her back around 2 pm

## 2023-07-28 NOTE — Telephone Encounter (Signed)
 Ebony calling for a update. Please advise

## 2023-07-28 NOTE — Telephone Encounter (Signed)
 Phone number [phone]

## 2023-07-28 NOTE — Telephone Encounter (Signed)
 Spoke to [place] and faxed over current medication list.

## 2023-07-30 ENCOUNTER — Emergency Department (HOSPITAL_COMMUNITY)
Admission: EM | Admit: 2023-07-30 | Discharge: 2023-07-30 | Disposition: A | Discharge status: Home / Self Care | Attending: Emergency Medicine | Admitting: Emergency Medicine

## 2023-07-30 ENCOUNTER — Encounter (HOSPITAL_COMMUNITY): Payer: Self-pay

## 2023-07-30 ENCOUNTER — Other Ambulatory Visit: Payer: Self-pay

## 2023-07-30 DIAGNOSIS — M5441 Lumbago with sciatica, right side: Secondary | ICD-10-CM | POA: Insufficient documentation

## 2023-07-30 DIAGNOSIS — Z7982 Long term (current) use of aspirin: Secondary | ICD-10-CM | POA: Diagnosis not present

## 2023-07-30 DIAGNOSIS — M545 Low back pain, unspecified: Secondary | ICD-10-CM | POA: Diagnosis present

## 2023-07-30 DIAGNOSIS — M5431 Sciatica, right side: Secondary | ICD-10-CM

## 2023-07-30 LAB — URINALYSIS, ROUTINE W REFLEX MICROSCOPIC
Bilirubin Urine: NEGATIVE
Glucose, UA: NEGATIVE mg/dL
Hgb urine dipstick: NEGATIVE
Ketones, ur: NEGATIVE mg/dL
Leukocytes,Ua: NEGATIVE
Nitrite: NEGATIVE
Protein, ur: NEGATIVE mg/dL
Specific Gravity, Urine: 1.017 (ref 1.005–1.030)
pH: 7 (ref 5.0–8.0)

## 2023-07-30 MED ORDER — HYDROCODONE-ACETAMINOPHEN 5-325 MG PO TABS: ORAL_TABLET | ORAL | 0 refills | Status: DC

## 2023-07-30 MED ORDER — PREDNISONE 10 MG PO TABS: ORAL_TABLET | ORAL | 0 refills | Status: DC

## 2023-07-30 NOTE — ED Provider Notes (Signed)
 [place] [facility]GENCY DEPARTMENT AT [facility] Provider Note   CSN: [id number] Arrival date & time: 07/30/23  1238     History {Add pertinent medical, surgical, social history, OB history to HPI:1} Chief Complaint  Patient presents with   Back Pain    Angel Price is a 58 y.o. female.   Back Pain Associated symptoms: no abdominal pain, no chest pain, no fever, no headaches, no numbness and no weakness        Angel Price is a 58 y.o. female who presents to the Emergency Department complaining of right-sided low back pain for several days.  She states the pain is achy in quality and radiates from her right lower back into her buttock hip and down her right lateral leg.  Pain is worse with standing and walking, improves at rest.  Denies any known injuries.  She also endorses some urinary hesitancy, she feels as though she urinates more frequently but does not fully empty her bladder.  She denies any burning with urination cloudy urine or hematuria.  No history of kidney stones.  She denies any abdominal pain numbness or weakness of her lower extremities.  No fever or chills.  Home Medications Prior to Admission medications   Medication Sig Start Date End Date Taking? Authorizing Provider  amLODipine  (NORVASC ) 10 MG tablet Take 10 mg by mouth daily. 05/18/21   [provider]  aspirin  EC 81 MG EC tablet Take 1 tablet (81 mg total) by mouth daily. 05/19/19   Elgergawy, Ardia Kraft, MD  Cholecalciferol  50 MCG (2000 UT) CAPS Take 1 tablet by mouth daily.    [provider]  cyclobenzaprine  (FLEXERIL ) 5 MG tablet Take 1 tablet (5 mg total) by mouth 2 (two) times daily as needed for muscle spasms. 07/12/23   Small, Brooke L, PA  dicyclomine  (BENTYL ) 20 MG tablet Take 20 mg by mouth every 6 (six) hours as needed for spasms. 10/24/22   [provider]  DULoxetine  (CYMBALTA ) 30 MG capsule Take 30 mg by mouth 2 (two) times daily. 08/14/21   [provider]  escitalopram  (LEXAPRO ) 20 MG tablet Take 20 mg by mouth daily.    [provider]  gabapentin  (NEURONTIN ) 100 MG capsule Take 100 mg by mouth 3 (three) times daily. 03/03/23   [provider]  HYDROcodone -acetaminophen  (NORCO) 10-325 MG tablet Take 1 tablet by mouth every 4 (four) hours as needed for moderate pain (pain score 4-6). 07/13/23   [provider]  hydroxychloroquine  (PLAQUENIL ) 200 MG tablet Take 1.5 tablets (300 mg total) by mouth daily. 05/05/23   Rice, Haig Levan, MD  metoprolol  tartrate (LOPRESSOR ) 50 MG tablet Take 50 mg by mouth 2 (two) times daily. 01/06/20   [provider]  mirtazapine  (REMERON ) 15 MG tablet Take 15 mg by mouth at bedtime as needed (for sleep). 08/10/21   [provider]  nitroGLYCERIN  (NITROSTAT ) 0.4 MG SL tablet Place 1 tablet (0.4 mg total) under the tongue every 5 (five) minutes as needed for chest pain. 12/29/19 07/15/23  Patwardhan, Kaye Parsons, MD  prochlorperazine  (COMPAZINE ) 10 MG tablet Take 10 mg by mouth every 6 (six) hours as needed for vomiting or nausea.    [provider]  promethazine  (PHENERGAN ) 12.5 MG tablet Take 12.5 mg by mouth every 6 (six) hours as needed for nausea or vomiting.  12/18/19   [provider]  Rimegepant Sulfate  (NURTEC) 75 MG TBDP Take 75 mg by mouth as needed. Patient taking  differently: Take 75 mg by mouth as needed (migraines). 09/12/21   Dala Dublin, MD  rosuvastatin  (CRESTOR ) 20 MG tablet Take 1 tablet (20 mg total) by mouth daily. 06/25/19 11/06/28  Patwardhan, Kaye Parsons, MD  thiamine  (VITAMIN B1) 100 MG tablet Take 100 mg by mouth daily. 04/26/23   [provider]  tiZANidine (ZANAFLEX) 2 MG tablet Take 2 mg by mouth at bedtime. 03/03/23   [provider]  zolpidem  (AMBIEN  CR) 12.5 MG CR tablet Take 12.5 mg by mouth at bedtime. 08/03/13   [provider]      Allergies    Metoclopramide , Codeine, and Reglan   [metoclopramide ]    Review of Systems   Review of Systems  Constitutional:  Negative for appetite change, chills and fever.  Respiratory:  Negative for cough.   Cardiovascular:  Negative for chest pain and leg swelling.  Gastrointestinal:  Negative for abdominal pain, diarrhea, nausea and vomiting.  Genitourinary:  Positive for decreased urine volume, difficulty urinating and frequency. Negative for flank pain, vaginal bleeding, vaginal discharge and vaginal pain.  Musculoskeletal:  Positive for back pain. Negative for neck pain and neck stiffness.  Skin:  Negative for rash.  Neurological:  Negative for dizziness, syncope, weakness, numbness and headaches.    Physical Exam Updated Vital Signs BP (!) 133/98 (BP Location: Right Arm)   Pulse 88   Temp 97.7 F (36.5 C) (Oral)   Resp 18   Ht 5\' 3"  (1.6 m)   Wt 73 kg   LMP  (LMP Unknown)   SpO2 99%   BMI 28.52 kg/m  Physical Exam Vitals and nursing note reviewed.  Constitutional:      General: She is not in acute distress.    Appearance: Normal appearance. She is not ill-appearing or toxic-appearing.  Cardiovascular:     Rate and Rhythm: Normal rate and regular rhythm.     Pulses: Normal pulses.  Pulmonary:     Effort: Pulmonary effort is normal.  Chest:     Chest wall: No tenderness.  Abdominal:     General: There is no distension.     Palpations: Abdomen is soft.     Tenderness: There is abdominal tenderness. There is no right CVA tenderness or left CVA tenderness.  Musculoskeletal:        General: Normal range of motion.     Lumbar back: Spasms and tenderness present. No swelling, signs of trauma or bony tenderness. Normal range of motion.     Comments: Tenderness palpation right lower lumbar paraspinal muscles.  No midline tenderness or bony step-off.  Tender at the right SI joint space.    Skin:    General: Skin is warm.     Capillary Refill: Capillary refill takes less than 2 seconds.  Neurological:     General: No  focal deficit present.     Mental Status: She is alert.     Sensory: Sensation is intact. No sensory deficit.     Motor: Motor function is intact. No weakness.     Coordination: Coordination is intact.     ED Results / Procedures / Treatments   Labs (all labs ordered are listed, but only abnormal results are displayed) Labs Reviewed  URINALYSIS, ROUTINE W REFLEX MICROSCOPIC    EKG None  Radiology No results found.  Procedures Procedures  {Document cardiac monitor, telemetry assessment procedure when appropriate:1}  Medications Ordered in ED Medications - No data to display  ED Course/ Medical Decision Making/ A&P   {  Click here for ABCD2, HEART and other calculatorsREFRESH Note before signing :1}                              Medical Decision Making Patient here with several day history of right-sided low back pain with pain radiating into her right leg.  Pain is worse with walking standing and improves at rest.  No history of heavy lifting or trauma.  She denies any abdominal pain numbness or weakness of her lower extremities no fever or chills.  She does endorse having some urinary hesitancy and feels as though she does not fully empty her bladder.  Differential would include but not limited to sciatica, pyelonephritis, UTI, cauda equina also considered but felt less likely.  Amount and/or Complexity of Data Reviewed Labs: ordered.    Details: Urinalysis shows Discussion of management or test interpretation with external provider(s):  Patient had PVR of  Was able dilatory to the restroom without difficulty.  Neurovascularly intact.  No red flags on exam suggestive of cauda equina. Symptoms suspected to be related to sciatica.  Will treat with anti-inflammatory and short course of pain medication.  Patient has muscle relaxers at home.  Database was reviewed.       {Document critical care time when appropriate:1} {Document review of labs and clinical decision  tools ie heart score, Chads2Vasc2 etc:1}  {Document your independent review of radiology images, and any outside records:1} {Document your discussion with family members, caretakers, and with consultants:1} {Document social determinants of health affecting pt's care:1} {Document your decision making why or why not admission, treatments were needed:1} Final Clinical Impression(s) / ED Diagnoses Final diagnoses:  None    Rx / DC Orders ED Discharge Orders     None

## 2023-07-30 NOTE — Discharge Instructions (Signed)
 Alternate ice and heat to your lower back.  Avoid bending twisting or heavy lifting for at least 1 week.  You may also try the over-the-counter 4% lidocaine  patches applied to the affected area as directed.  Please call your primary care provider to arrange follow-up appointment for recheck.

## 2023-07-30 NOTE — ED Triage Notes (Signed)
 Pt reports right side low back pain for several days and now pain is radiating down her hip and thigh.  Pt denies any injuries recently.

## 2023-08-05 ENCOUNTER — Encounter: Payer: Self-pay | Admitting: *Deleted

## 2023-08-05 ENCOUNTER — Encounter: Payer: Self-pay | Admitting: Gastroenterology

## 2023-08-05 ENCOUNTER — Ambulatory Visit (INDEPENDENT_AMBULATORY_CARE_PROVIDER_SITE_OTHER): Admitting: Gastroenterology

## 2023-08-05 VITALS — BP 125/84 | HR 78 | Temp 98.0°F | Ht 63.0 in | Wt 163.2 lb

## 2023-08-05 DIAGNOSIS — K921 Melena: Secondary | ICD-10-CM

## 2023-08-05 DIAGNOSIS — R198 Other specified symptoms and signs involving the digestive system and abdomen: Secondary | ICD-10-CM | POA: Insufficient documentation

## 2023-08-05 DIAGNOSIS — D649 Anemia, unspecified: Secondary | ICD-10-CM | POA: Insufficient documentation

## 2023-08-05 DIAGNOSIS — K5900 Constipation, unspecified: Secondary | ICD-10-CM | POA: Diagnosis not present

## 2023-08-05 DIAGNOSIS — R1319 Other dysphagia: Secondary | ICD-10-CM

## 2023-08-05 DIAGNOSIS — R140 Abdominal distension (gaseous): Secondary | ICD-10-CM | POA: Diagnosis not present

## 2023-08-05 DIAGNOSIS — R1084 Generalized abdominal pain: Secondary | ICD-10-CM

## 2023-08-05 DIAGNOSIS — K6289 Other specified diseases of anus and rectum: Secondary | ICD-10-CM

## 2023-08-05 DIAGNOSIS — R1310 Dysphagia, unspecified: Secondary | ICD-10-CM

## 2023-08-05 MED ORDER — HYDROCORTISONE (PERIANAL) 2.5 % EX CREA: 1.0000 | TOPICAL_CREAM | Freq: Two times a day (BID) | CUTANEOUS | 1 refills | Status: DC

## 2023-08-05 MED ORDER — PANTOPRAZOLE SODIUM 40 MG PO TBEC: 40.0000 mg | DELAYED_RELEASE_TABLET | Freq: Every day | ORAL | 3 refills | Status: DC

## 2023-08-05 MED ORDER — POLYETHYLENE GLYCOL 3350 17 GM/SCOOP PO POWD: ORAL | Status: DC

## 2023-08-05 NOTE — Patient Instructions (Addendum)
 Add miralax  two capfuls daily until soft stool, then continue once daily.  Anusol  cream, apply inside rectum twice daily for 10 days. RX sent. Update labs now to follow up on black stools and anemia. Add pantoprazole  40mg  daily before breakfast for abdominal pain, swallowing issues.RX sent. Barium pill esophagram to further evaluate your swallowing issues.  Return office visit in four weeks.

## 2023-08-05 NOTE — Progress Notes (Signed)
 GI Office Note    Referring Provider: Wyvonna Heidelberg* Primary Care Physician:  Fanta, Tesfaye Demissie, MD  Primary Gastroenterologist: Rheba Cedar, MD   Chief Complaint   Chief Complaint  Patient presents with   Abdominal Pain   Rectal Pain    Having some rectal pain/pressure, states that she has had some charcoal colored stools    History of Present Illness   Angel Price is a 58 y.o. female presenting today for follow up. Last seen 03/2023 by Shana Daring, PA-C.   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       Patient was recently admitted 03/14/2023 - 03/16/2023 with concern for CVA after she presented with left-sided weakness, visual deficits.  However, she was found to have severe hypokalemia in the setting of nausea and vomiting and it was felt that this was contributing to her symptoms as her brain MRI was negative.  After potassium replacement, patient was feeling much better.  Gi profile in 03/2023: positive for enteropathogenic/enterotoxigenic Ecoli.treated with azithromycin  500mg  daily for 3 days. TTG IgA/serum IgA unremarkable.  Today:  BM not regular anymore. Was going every day but for the past one week, hard to go. Incomplete stools. Has some rectal discomfort now. Taking stool softeners, usually coffee and salad help but not working this time. Feels bloated. Recent use of hydrocodone  for sciatica but not daily. No brbpr. Stool black, two days ago. Not every time. No Pepto. No heartburn. Dysphagia to solid food, has to beat chest and drink something behind it to make it go down. Sometime pill dysphagia. No vomiting. Central abdominal pain,  intermittent. Worse with meals.   ED 06/2023 for chest pain. Cardiac work up pending.  No NSAIDs.  ASA 81mg  daily.  CT A/P with contrast 03/2023 IMPRESSION: 1. No acute abnormality in the abdomen or pelvis. 2. Surgical changes of prior gastric bypass, cholecystectomy, right adrenalectomy and hysterectomy. 3. Stable 18 mm left adrenal nodule previously characterized as a benign adenoma on noncontrast enhanced examination dated February 21, 2023 this requires no independent imaging follow-up. 4.  Aortic Atherosclerosis (ICD10-I70.0).  EGD 10/01/2021: Mild Schatzki's ring dilated, small amount of food residue in the stomach, otherwise normal exam.   Last colonoscopy 12/24/2018: Diverticulosis in the entire colon, normal TI, 5 mm polyp resected and retrieved, somewhat friable hemorrhagic mucosa in the area of the ileocecal valve/cecum which may be artifact related to scope trauma s/p biopsy.  Polyp was a tubular adenoma.  Colon biopsy was benign.  Recommended 7-year surveillance.    Wt Readings from Last 10 Encounters:  08/05/23 163 lb 3.2 oz (74 kg)  07/30/23 161 lb (73 kg)  07/15/23 160 lb 9.6 oz (72.8 kg)  07/12/23 151 lb (68.5 kg)  07/08/23 153 lb (69.4 kg)  05/05/23 153 lb (69.4 kg)  03/21/23 141 lb 9.6 oz (64.2 kg)  03/14/23 145 lb (65.8 kg)  02/20/23 149 lb (67.6 kg)  10/31/22 153 lb (69.4 kg)      Medications   Current Outpatient Medications  Medication Sig Dispense Refill   amLODipine  (NORVASC ) 10 MG tablet Take 10 mg by mouth daily.     aspirin  EC 81 MG EC tablet Take 1 tablet (81 mg total) by mouth daily.  30 tablet 0   Cholecalciferol  50 MCG (2000 UT) CAPS Take 1 tablet by mouth daily.     cyclobenzaprine  (FLEXERIL ) 5 MG tablet Take 1 tablet (5 mg total) by mouth 2 (two) times daily as needed for muscle spasms. 30 tablet 0   DULoxetine  (CYMBALTA ) 30 MG capsule Take 30 mg by mouth 2 (two) times daily.     gabapentin  (NEURONTIN ) 100 MG capsule Take 100 mg by mouth 3  (three) times daily.     HYDROcodone -acetaminophen  (NORCO/VICODIN) 5-325 MG tablet Take one tab po q 4 hrs prn pain 12 tablet 0   hydroxychloroquine  (PLAQUENIL ) 200 MG tablet Take 1.5 tablets (300 mg total) by mouth daily. 135 tablet 1   metoprolol  tartrate (LOPRESSOR ) 50 MG tablet Take 50 mg by mouth 2 (two) times daily.     mirtazapine  (REMERON ) 15 MG tablet Take 15 mg by mouth at bedtime as needed (for sleep).     nitroGLYCERIN  (NITROSTAT ) 0.4 MG SL tablet Place 1 tablet (0.4 mg total) under the tongue every 5 (five) minutes as needed for chest pain. 30 tablet 3   ondansetron  (ZOFRAN -ODT) 4 MG disintegrating tablet Take 4 mg by mouth every 4 (four) hours as needed.     Rimegepant Sulfate  (NURTEC) 75 MG TBDP Take 75 mg by mouth as needed. (Patient taking differently: Take 75 mg by mouth as needed (migraines).) 8 tablet 6   rosuvastatin  (CRESTOR ) 20 MG tablet Take 1 tablet (20 mg total) by mouth daily. 90 tablet 3   thiamine  (VITAMIN B1) 100 MG tablet Take 100 mg by mouth daily.     No current facility-administered medications for this visit.    Allergies   Allergies as of 08/05/2023 - Review Complete 08/05/2023  Allergen Reaction Noted   Metoclopramide  Other (See Comments) and Anaphylaxis 08/08/2016   Codeine Hives 05/19/2019   Reglan  [metoclopramide ] Other (See Comments) 05/19/2019       Review of Systems   General: Negative for anorexia, weight loss, fever, chills, fatigue, weakness. ENT: Negative for hoarseness,  nasal congestion.+dysphagia CV: Negative for chest pain, angina, palpitations, dyspnea on exertion, peripheral edema.  Respiratory: Negative for dyspnea at rest, dyspnea on exertion, cough, sputum, wheezing.  GI: See history of present illness. GU:  Negative for dysuria, hematuria, urinary incontinence, urinary frequency, nocturnal urination.  Endo: Negative for unusual weight change.     Physical Exam   BP 125/84 (BP Location: Right Arm, Patient Position: Sitting,  Cuff Size: Normal)   Pulse 78   Temp 98 F (36.7 C) (Oral)   Ht 5\' 3"  (1.6 m)   Wt 163 lb 3.2 oz (74 kg)   LMP  (LMP Unknown)   SpO2 100%   BMI 28.91 kg/m    General: Well-nourished, well-developed in no acute distress.  Eyes: No icterus. Mouth: Oropharyngeal mucosa moist and pink   Lungs: Clear to auscultation bilaterally.  Heart: Regular rate and rhythm, no murmurs rubs or gallops.  Abdomen: Bowel sounds are normal, nontender, nondistended, no hepatosplenomegaly or masses,  no abdominal bruits or hernia , no rebound or guarding.  Rectal: external tags, anterior pain on dre. No mass. Stool heme negative.   Extremities: No lower extremity edema. No clubbing or deformities. Neuro: Alert and oriented x 4   Skin: Warm and dry, no jaundice.   Psych: Alert and cooperative, normal mood and affect.  Labs   Lab Results  Component Value Date   NA 139 07/12/2023   CL 105 07/12/2023   K 3.3 (L)  07/12/2023   CO2 26 07/12/2023   BUN 9 07/12/2023   CREATININE 0.59 07/12/2023   GFRNONAA >60 07/12/2023   CALCIUM  9.1 07/12/2023   PHOS 4.0 06/16/2019   ALBUMIN 3.6 07/12/2023   GLUCOSE 96 07/12/2023   Lab Results  Component Value Date   ALT 25 07/12/2023   AST 20 07/12/2023   ALKPHOS 74 07/12/2023   BILITOT 0.3 07/12/2023   Lab Results  Component Value Date   WBC 7.0 07/12/2023   HGB 11.5 (L) 07/12/2023   HCT 31.8 (L) 07/12/2023   MCV 73.1 (L) 07/12/2023   PLT 313 07/12/2023   Lab Results  Component Value Date   LIPASE 24 07/12/2023   Lab Results  Component Value Date   IRON 55 03/26/2023   TIBC 387 03/26/2023   FERRITIN 25 03/26/2023   Lab Results  Component Value Date   VITAMINB12 237 03/26/2023   Lab Results  Component Value Date   FOLATE 12.4 03/26/2023    Imaging Studies   DG HIP UNILAT WITH PELVIS 2-3 VIEWS RIGHT Result Date: 08/02/2023 CLINICAL DATA:  Right hip pain for months.  No known injury. EXAM: DG HIP (WITH OR WITHOUT PELVIS) 2-3V RIGHT  COMPARISON:  Bilateral hip radiographs 03/06/2022 FINDINGS: Normal bone mineralization. Joint spaces are preserved. No acute fracture is seen. No dislocation. Vascular phleboliths overlie the pelvis. IMPRESSION: Normal right hip radiographs. Electronically Signed   By: Bertina Broccoli M.D.   On: 08/02/2023 22:52   CT Angio Chest/Abd/Pel for Dissection W and/or Wo Contrast Result Date: 07/12/2023 CLINICAL DATA:  Left chest pain and arm tingling. Clinical concern for aortic aneurysm. EXAM: CT ANGIOGRAPHY CHEST, ABDOMEN AND PELVIS TECHNIQUE: Non-contrast CT of the chest was initially obtained. Multidetector CT imaging through the chest, abdomen and pelvis was performed using the standard protocol during bolus administration of intravenous contrast. Multiplanar reconstructed images and MIPs were obtained and reviewed to evaluate the vascular anatomy. RADIATION DOSE REDUCTION: This exam was performed according to the departmental dose-optimization program which includes automated exposure control, adjustment of the mA and/or kV according to patient size and/or use of iterative reconstruction technique. CONTRAST:  OMNIPAQUE  IOHEXOL  350 MG/ML SOLN COMPARISON:  Chest radiographs dated 07/12/2023 and 05/16/2022. Abdomen and pelvis CT dated 03/26/2023. FINDINGS: CTA CHEST FINDINGS Cardiovascular: Atheromatous calcifications, including the coronary arteries and aorta. Mildly enlarged central pulmonary arteries with a main pulmonary artery diameter of 3.3 cm. No aortic aneurysm or dissection. Mediastinum/Nodes: No enlarged mediastinal, hilar, or axillary lymph nodes. Thyroid  gland, trachea, and esophagus demonstrate no significant findings. Lungs/Pleura: Linear scarring in the right upper lobe with tiny associated calcification. Right middle lobe calcified granuloma. Mild bilateral dependent atelectasis, left greater than right. No pleural fluid. Musculoskeletal: Unremarkable liver.  Cholecystectomy clips. Review of the  MIP images confirms the above findings. CTA ABDOMEN AND PELVIS FINDINGS VASCULAR Aorta: Mild atheromatous calcifications without aneurysm or dissection. Celiac: Patent without evidence of aneurysm, dissection, vasculitis or significant stenosis. SMA: Patent without evidence of aneurysm, dissection, vasculitis or significant stenosis. Renals: Both renal arteries are patent without evidence of aneurysm, dissection, vasculitis, fibromuscular dysplasia or significant stenosis. IMA: Patent without evidence of aneurysm, dissection, vasculitis or significant stenosis. Inflow: Mild atheromatous calcifications without aneurysmal stenosis. Veins: No obvious venous abnormality within the limitations of this arterial phase study. Review of the MIP images confirms the above findings. NON-VASCULAR Hepatobiliary: Normal-appearing liver.  Cholecystectomy clips. Pancreas: Unremarkable. No pancreatic ductal dilatation or surrounding inflammatory changes. Spleen: Normal in size without focal abnormality. Adrenals/Urinary Tract:  Stable previously demonstrated low-density left adrenal adenoma. Stable surgically absent right adrenal gland with associated surgical clips. Unremarkable kidneys, ureters and urinary bladder. Stomach/Bowel: Minimal colonic diverticulosis without evidence of diverticulitis. Poor distention of the proximal transverse colon limiting evaluation. Post gastric bypass changes. Focal small-bowel dilatation at the small bowel anastomosis is unchanged. Nonvisualized appendix. Lymphatic: No enlarged lymph nodes. Reproductive: Status post hysterectomy. No adnexal masses. Other: Anterior abdominal surgical scars. Musculoskeletal: Minimal lumbar spine degenerative changes. Review of the MIP images confirms the above findings. IMPRESSION: 1. No aortic aneurysm or dissection. 2. Mildly enlarged central pulmonary arteries, suggesting pulmonary arterial hypertension. 3. Stable left adrenal adenoma. 4. Minimal colonic  diverticulosis without evidence of diverticulitis. 5.  Calcific coronary artery and aortic atherosclerosis. Aortic Atherosclerosis (ICD10-I70.0). Electronically Signed   By: Catherin Closs M.D.   On: 07/12/2023 16:59   DG Chest 2 View Result Date: 07/12/2023 CLINICAL DATA:  Chest pain EXAM: CHEST - 2 VIEW COMPARISON:  05/16/18 for FINDINGS: Heart size and mediastinal contours are normal. There is no pleural fluid, interstitial edema or airspace consolidation. There are surgical clips identified within the right upper quadrant of the abdomen. The visualized osseous structures are unremarkable. IMPRESSION: No active cardiopulmonary disease. Electronically Signed   By: Kimberley Penman M.D.   On: 07/12/2023 13:03    Assessment/Plan:   Generalized abdominal pain/bloating/constipation: could be worsened in setting of hydrocodone , history of electrolyte abnormalities previously, could cause some slow motility, ileus. Will need to follow up on that. CTA a/p reassuring last month.   -add miralax  two capfuls daily until soft stool, then one capful daily -magnesium , CMET, CBC -add pantoprazole  40mg  daily  Anemia/?melena: chronic mild anemia in 10-11 range with periods of normalcy. Hgb 13.3 four weeks ago, then 11.5 three weeks ago. B12 low normal. History of prior folate def remotely. Not on supplements at this time. Taking B1. -update labs today, B12, folate, iron/tibc/ferritin  Esophageal dysphagia: history of Schatzki ring s/p dilation in 2023 -BPE -add pantoprazole  40mg  daily  Rectal pain: -possible hemorrhoid related or minor fissure -avoid hard stools, straining -anusol  cream bid for 10 days  Return ov in four weeks     Trudie Fuse. Harles Lied, MHS, PA-C [facility]

## 2023-08-08 ENCOUNTER — Other Ambulatory Visit (HOSPITAL_COMMUNITY)

## 2023-08-13 ENCOUNTER — Ambulatory Visit (HOSPITAL_COMMUNITY)
Admission: RE | Admit: 2023-08-13 | Discharge: 2023-08-13 | Disposition: A | Discharge status: Home / Self Care | Source: Ambulatory Visit | Attending: Gastroenterology | Admitting: Gastroenterology

## 2023-08-13 DIAGNOSIS — K5900 Constipation, unspecified: Secondary | ICD-10-CM | POA: Insufficient documentation

## 2023-08-13 DIAGNOSIS — D649 Anemia, unspecified: Secondary | ICD-10-CM | POA: Diagnosis present

## 2023-08-13 DIAGNOSIS — K921 Melena: Secondary | ICD-10-CM | POA: Diagnosis present

## 2023-08-13 DIAGNOSIS — R140 Abdominal distension (gaseous): Secondary | ICD-10-CM | POA: Diagnosis present

## 2023-08-13 DIAGNOSIS — R198 Other specified symptoms and signs involving the digestive system and abdomen: Secondary | ICD-10-CM | POA: Insufficient documentation

## 2023-08-13 DIAGNOSIS — R1084 Generalized abdominal pain: Secondary | ICD-10-CM | POA: Insufficient documentation

## 2023-08-13 DIAGNOSIS — R1319 Other dysphagia: Secondary | ICD-10-CM | POA: Insufficient documentation

## 2023-08-14 ENCOUNTER — Encounter: Payer: Self-pay | Admitting: Gastroenterology

## 2023-08-17 ENCOUNTER — Encounter (HOSPITAL_COMMUNITY): Payer: Self-pay

## 2023-08-17 ENCOUNTER — Ambulatory Visit: Payer: Self-pay | Admitting: Gastroenterology

## 2023-08-19 ENCOUNTER — Ambulatory Visit (HOSPITAL_COMMUNITY)
Admission: RE | Admit: 2023-08-19 | Discharge: 2023-08-19 | Disposition: A | Discharge status: Home / Self Care | Source: Ambulatory Visit | Attending: Cardiology | Admitting: Cardiology

## 2023-08-19 DIAGNOSIS — I25118 Atherosclerotic heart disease of native coronary artery with other forms of angina pectoris: Secondary | ICD-10-CM | POA: Diagnosis present

## 2023-08-19 MED ORDER — RUBIDIUM RB82 GENERATOR (RUBYFILL)
19.3600 | PACK | Freq: Once | INTRAVENOUS | Status: AC
  Administered 2023-08-19: 19.36 via INTRAVENOUS

## 2023-08-19 MED ORDER — RUBIDIUM RB82 GENERATOR (RUBYFILL)
19.4500 | PACK | Freq: Once | INTRAVENOUS | Status: AC
  Administered 2023-08-19: 19.45 via INTRAVENOUS

## 2023-08-19 MED ORDER — REGADENOSON 0.4 MG/5ML IV SOLN
INTRAVENOUS | Status: AC
Start: 2023-08-19 — End: ?
  Filled 2023-08-19: qty 5

## 2023-08-19 MED ORDER — REGADENOSON 0.4 MG/5ML IV SOLN
0.4000 mg | Freq: Once | INTRAVENOUS | Status: AC
  Administered 2023-08-19: 0.4 mg via INTRAVENOUS

## 2023-08-19 NOTE — Progress Notes (Signed)
 Pt. Tolerated lexi scan well.

## 2023-08-20 ENCOUNTER — Ambulatory Visit: Payer: Self-pay | Admitting: Cardiology

## 2023-08-20 LAB — NM PET CT CARDIAC PERFUSION MULTI W/ABSOLUTE BLOODFLOW
MBFR: 1.82
Nuc Rest EF: 60 %
Nuc Stress EF: 73 %
Rest MBF: 1.06 ml/g/min
Rest Nuclear Isotope Dose: 19.5 mCi
ST Depression (mm): 0 mm
Stress MBF: 1.93 ml/g/min
Stress Nuclear Isotope Dose: 19.4 mCi
TID: 1.08

## 2023-08-20 NOTE — Progress Notes (Signed)
 While her chest pain remains atypical, stress test does show abnormalities suggestive of significant narrowing in her heart arteries.  Reasonable to consider heart catheterization.  Her follow-up with me is on 7/31.  Consider follow-up with APP sooner to schedule for heart catheterization.  Thanks MJP

## 2023-08-20 NOTE — Telephone Encounter (Signed)
Patient is calling to follow up on results. Please advise

## 2023-08-21 ENCOUNTER — Emergency Department (HOSPITAL_COMMUNITY)

## 2023-08-21 ENCOUNTER — Observation Stay (HOSPITAL_COMMUNITY)
Admission: EM | Admit: 2023-08-21 | Discharge: 2023-08-23 | Disposition: A | Discharge status: Home / Self Care | Attending: Family Medicine | Admitting: Family Medicine

## 2023-08-21 ENCOUNTER — Other Ambulatory Visit: Payer: Self-pay

## 2023-08-21 ENCOUNTER — Encounter (HOSPITAL_COMMUNITY): Payer: Self-pay

## 2023-08-21 DIAGNOSIS — E785 Hyperlipidemia, unspecified: Secondary | ICD-10-CM | POA: Diagnosis not present

## 2023-08-21 DIAGNOSIS — M3210 Systemic lupus erythematosus, organ or system involvement unspecified: Secondary | ICD-10-CM | POA: Diagnosis not present

## 2023-08-21 DIAGNOSIS — Z7982 Long term (current) use of aspirin: Secondary | ICD-10-CM | POA: Insufficient documentation

## 2023-08-21 DIAGNOSIS — I2510 Atherosclerotic heart disease of native coronary artery without angina pectoris: Secondary | ICD-10-CM | POA: Diagnosis not present

## 2023-08-21 DIAGNOSIS — Z8673 Personal history of transient ischemic attack (TIA), and cerebral infarction without residual deficits: Secondary | ICD-10-CM | POA: Diagnosis not present

## 2023-08-21 DIAGNOSIS — R0602 Shortness of breath: Secondary | ICD-10-CM | POA: Diagnosis present

## 2023-08-21 DIAGNOSIS — K222 Esophageal obstruction: Secondary | ICD-10-CM | POA: Diagnosis not present

## 2023-08-21 DIAGNOSIS — I10 Essential (primary) hypertension: Secondary | ICD-10-CM | POA: Diagnosis present

## 2023-08-21 DIAGNOSIS — R079 Chest pain, unspecified: Secondary | ICD-10-CM | POA: Diagnosis not present

## 2023-08-21 DIAGNOSIS — Z79899 Other long term (current) drug therapy: Secondary | ICD-10-CM | POA: Diagnosis not present

## 2023-08-21 DIAGNOSIS — D509 Iron deficiency anemia, unspecified: Secondary | ICD-10-CM | POA: Diagnosis not present

## 2023-08-21 DIAGNOSIS — D649 Anemia, unspecified: Secondary | ICD-10-CM | POA: Diagnosis not present

## 2023-08-21 DIAGNOSIS — M329 Systemic lupus erythematosus, unspecified: Secondary | ICD-10-CM | POA: Diagnosis not present

## 2023-08-21 DIAGNOSIS — F17210 Nicotine dependence, cigarettes, uncomplicated: Secondary | ICD-10-CM | POA: Diagnosis not present

## 2023-08-21 DIAGNOSIS — Z8659 Personal history of other mental and behavioral disorders: Secondary | ICD-10-CM

## 2023-08-21 LAB — COMPREHENSIVE METABOLIC PANEL WITH GFR
ALT: 29 U/L (ref 0–44)
AST: 25 U/L (ref 15–41)
Albumin: 4 g/dL (ref 3.5–5.0)
Alkaline Phosphatase: 87 U/L (ref 38–126)
Anion gap: 4 — ABNORMAL LOW (ref 5–15)
BUN: 7 mg/dL (ref 6–20)
CO2: 22 mmol/L (ref 22–32)
Calcium: 8.9 mg/dL (ref 8.9–10.3)
Chloride: 111 mmol/L (ref 98–111)
Creatinine, Ser: 0.46 mg/dL (ref 0.44–1.00)
GFR, Estimated: >60 mL/min (ref >60)
Glucose, Bld: 74 mg/dL (ref 70–99)
Potassium: 3.7 mmol/L (ref 3.5–5.1)
Sodium: 137 mmol/L (ref 135–145)
Total Bilirubin: 0.7 mg/dL (ref 0.0–1.2)
Total Protein: 7.6 g/dL (ref 6.5–8.1)

## 2023-08-21 LAB — CBC WITH DIFFERENTIAL/PLATELET
Abs Immature Granulocytes: 0.01 10*3/uL (ref 0.00–0.07)
Basophils Absolute: 0 10*3/uL (ref 0.0–0.1)
Basophils Relative: 0 %
Eosinophils Absolute: 0.2 10*3/uL (ref 0.0–0.5)
Eosinophils Relative: 3 %
HCT: 39.4 % (ref 36.0–46.0)
Hemoglobin: 13.8 g/dL (ref 12.0–15.0)
Immature Granulocytes: 0 %
Lymphocytes Relative: 40 %
Lymphs Abs: 2.7 10*3/uL (ref 0.7–4.0)
MCH: 26.8 pg (ref 26.0–34.0)
MCHC: 35 g/dL (ref 30.0–36.0)
MCV: 76.7 fL — ABNORMAL LOW (ref 80.0–100.0)
Monocytes Absolute: 0.6 10*3/uL (ref 0.1–1.0)
Monocytes Relative: 9 %
Neutro Abs: 3.3 10*3/uL (ref 1.7–7.7)
Neutrophils Relative %: 48 %
Platelets: 302 10*3/uL (ref 150–400)
RBC: 5.14 MIL/uL — ABNORMAL HIGH (ref 3.87–5.11)
RDW: 16.8 % — ABNORMAL HIGH (ref 11.5–15.5)
WBC: 6.9 10*3/uL (ref 4.0–10.5)
nRBC: 0 % (ref 0.0–0.2)

## 2023-08-21 LAB — RAPID URINE DRUG SCREEN, HOSP PERFORMED
Amphetamines: NOT DETECTED
Barbiturates: NOT DETECTED
Benzodiazepines: NOT DETECTED
Cocaine: NOT DETECTED
Opiates: POSITIVE — AB
Tetrahydrocannabinol: NOT DETECTED

## 2023-08-21 LAB — TROPONIN I (HIGH SENSITIVITY)
Troponin I (High Sensitivity): <2 ng/L (ref <18)
Troponin I (High Sensitivity): <2 ng/L (ref <18)

## 2023-08-21 LAB — D-DIMER, QUANTITATIVE: D-Dimer, Quant: 0.44 ug{FEU}/mL (ref 0.00–0.50)

## 2023-08-21 LAB — BRAIN NATRIURETIC PEPTIDE: B Natriuretic Peptide: 10 pg/mL (ref 0.0–100.0)

## 2023-08-21 LAB — LIPASE, BLOOD: Lipase: 24 U/L (ref 11–51)

## 2023-08-21 MED ORDER — IPRATROPIUM-ALBUTEROL 0.5-2.5 (3) MG/3ML IN SOLN
3.0000 mL | Freq: Once | RESPIRATORY_TRACT | Status: AC
  Administered 2023-08-21: 3 mL via RESPIRATORY_TRACT
  Filled 2023-08-21: qty 3

## 2023-08-21 MED ORDER — HYDROCODONE-ACETAMINOPHEN 5-325 MG PO TABS
1.0000 | ORAL_TABLET | ORAL | Status: DC | PRN
  Administered 2023-08-22 – 2023-08-23 (×3): 1 via ORAL
  Filled 2023-08-21 (×3): qty 1

## 2023-08-21 MED ORDER — PANTOPRAZOLE SODIUM 40 MG PO TBEC
40.0000 mg | DELAYED_RELEASE_TABLET | Freq: Every day | ORAL | Status: DC
  Administered 2023-08-22 – 2023-08-23 (×2): 40 mg via ORAL
  Filled 2023-08-21 (×2): qty 1

## 2023-08-21 MED ORDER — DULOXETINE HCL 30 MG PO CPEP
30.0000 mg | ORAL_CAPSULE | Freq: Two times a day (BID) | ORAL | Status: DC
  Administered 2023-08-22 – 2023-08-23 (×4): 30 mg via ORAL
  Filled 2023-08-21 (×5): qty 1

## 2023-08-21 MED ORDER — GABAPENTIN 100 MG PO CAPS
100.0000 mg | ORAL_CAPSULE | Freq: Three times a day (TID) | ORAL | Status: DC
  Administered 2023-08-22 – 2023-08-23 (×4): 100 mg via ORAL
  Filled 2023-08-21 (×4): qty 1

## 2023-08-21 MED ORDER — ASPIRIN 81 MG PO TBEC
81.0000 mg | DELAYED_RELEASE_TABLET | Freq: Every day | ORAL | Status: DC
  Administered 2023-08-22 – 2023-08-23 (×2): 81 mg via ORAL
  Filled 2023-08-21 (×2): qty 1

## 2023-08-21 MED ORDER — THIAMINE MONONITRATE 100 MG PO TABS
100.0000 mg | ORAL_TABLET | Freq: Every day | ORAL | Status: DC
  Administered 2023-08-22 – 2023-08-23 (×2): 100 mg via ORAL
  Filled 2023-08-21 (×3): qty 1

## 2023-08-21 MED ORDER — ACETAMINOPHEN 325 MG PO TABS: 650.0000 mg | ORAL_TABLET | Freq: Four times a day (QID) | ORAL | Status: DC | PRN

## 2023-08-21 MED ORDER — AMLODIPINE BESYLATE 5 MG PO TABS
10.0000 mg | ORAL_TABLET | Freq: Every day | ORAL | Status: DC
  Filled 2023-08-21: qty 2

## 2023-08-21 MED ORDER — MORPHINE SULFATE (PF) 4 MG/ML IV SOLN
2.0000 mg | Freq: Once | INTRAVENOUS | Status: AC
  Administered 2023-08-21: 2 mg via INTRAVENOUS
  Filled 2023-08-21: qty 1

## 2023-08-21 MED ORDER — ROSUVASTATIN CALCIUM 20 MG PO TABS
20.0000 mg | ORAL_TABLET | Freq: Every day | ORAL | Status: DC
  Administered 2023-08-22 – 2023-08-23 (×2): 20 mg via ORAL
  Filled 2023-08-21 (×2): qty 1

## 2023-08-21 MED ORDER — MIRTAZAPINE 15 MG PO TABS
15.0000 mg | ORAL_TABLET | Freq: Every evening | ORAL | Status: DC | PRN
  Administered 2023-08-22: 15 mg via ORAL
  Filled 2023-08-21: qty 1

## 2023-08-21 MED ORDER — ACETAMINOPHEN 650 MG RE SUPP: 650.0000 mg | Freq: Four times a day (QID) | RECTAL | Status: DC | PRN

## 2023-08-21 MED ORDER — ONDANSETRON HCL 4 MG PO TABS: 4.0000 mg | ORAL_TABLET | Freq: Four times a day (QID) | ORAL | Status: DC | PRN

## 2023-08-21 MED ORDER — VITAMIN D 25 MCG (1000 UNIT) PO TABS
2000.0000 [IU] | ORAL_TABLET | Freq: Every day | ORAL | Status: DC
  Administered 2023-08-22 – 2023-08-23 (×2): 2000 [IU] via ORAL
  Filled 2023-08-21 (×2): qty 2

## 2023-08-21 MED ORDER — CYCLOBENZAPRINE HCL 10 MG PO TABS
5.0000 mg | ORAL_TABLET | Freq: Two times a day (BID) | ORAL | Status: DC | PRN
  Administered 2023-08-23: 5 mg via ORAL
  Filled 2023-08-21: qty 1

## 2023-08-21 MED ORDER — ONDANSETRON HCL 4 MG/2ML IJ SOLN: 4.0000 mg | Freq: Four times a day (QID) | INTRAMUSCULAR | Status: DC | PRN

## 2023-08-21 MED ORDER — MORPHINE SULFATE (PF) 4 MG/ML IV SOLN
4.0000 mg | Freq: Once | INTRAVENOUS | Status: AC
  Administered 2023-08-21: 4 mg via INTRAVENOUS
  Filled 2023-08-21: qty 1

## 2023-08-21 MED ORDER — NITROGLYCERIN 0.4 MG SL SUBL: 0.4000 mg | SUBLINGUAL_TABLET | SUBLINGUAL | Status: DC | PRN

## 2023-08-21 MED ORDER — METOPROLOL TARTRATE 50 MG PO TABS
50.0000 mg | ORAL_TABLET | Freq: Two times a day (BID) | ORAL | Status: DC
  Administered 2023-08-22 (×3): 50 mg via ORAL
  Filled 2023-08-21 (×4): qty 1

## 2023-08-21 MED ORDER — ENOXAPARIN SODIUM 40 MG/0.4ML IJ SOSY
40.0000 mg | PREFILLED_SYRINGE | INTRAMUSCULAR | Status: DC
  Administered 2023-08-22: 40 mg via SUBCUTANEOUS
  Filled 2023-08-21: qty 0.4

## 2023-08-21 MED ORDER — HYDROXYCHLOROQUINE SULFATE 200 MG PO TABS
300.0000 mg | ORAL_TABLET | Freq: Every day | ORAL | Status: DC
  Administered 2023-08-22 – 2023-08-23 (×2): 300 mg via ORAL
  Filled 2023-08-21 (×2): qty 2

## 2023-08-21 NOTE — ED Provider Notes (Signed)
 [facility] [facility] AT [facility] Provider Note   CSN: [id number] Arrival date & time: 08/21/23  1603     History Chief Complaint  Patient presents with   Chest Pain    Angel Price is a 58 y.o. female with history of lupus, hypertension, small bowel obstruction presents emerged from today for evaluation of left-sided chest pain rating to her left arm since yesterday around 1600.  She reports that she is having some nausea with greater than 10 episodes of nonbilious, nonbloody emesis since then.  Last time she vomited was around noon today. She reports that she does have chronic nausea and takes Zofran  but feels like it doesn't help. She denies any chronic cough, rhinorrhea, nasal congestion, fever, or chills.  Does have some shortness of breath that has been intermittent but worse with exertion.  She has been having this chest pain and shortness of breath off and on for years and is scheduled for heart cath later this month at [facility].  She reports that she will have some intermittent swelling in her ankles but is bilateral.  She denies any abdominal pain or black or bloody stools.  Still able to pass gas.  She is a daily tobacco user but denies any EtOH or illicit drug use.   Chest Pain Associated symptoms: nausea, shortness of breath and vomiting   Associated symptoms: no abdominal pain, no cough, no fever and no palpitations        Home Medications Prior to Admission medications   Medication Sig Start Date End Date Taking? Authorizing Provider  amLODipine  (NORVASC ) 10 MG tablet Take 10 mg by mouth daily. 05/18/21   [provider]  aspirin  EC 81 MG EC tablet Take 1 tablet (81 mg total) by mouth daily. 05/19/19   Elgergawy, Ardia Kraft, MD  Cholecalciferol  50 MCG (2000 UT) CAPS Take 1 tablet by mouth daily.    [provider]  cyclobenzaprine  (FLEXERIL ) 5 MG tablet Take 1 tablet (5 mg total) by mouth 2 (two) times daily as needed for muscle  spasms. 07/12/23   Small, Brooke L, PA  DULoxetine  (CYMBALTA ) 30 MG capsule Take 30 mg by mouth 2 (two) times daily. 08/14/21   [provider]  gabapentin  (NEURONTIN ) 100 MG capsule Take 100 mg by mouth 3 (three) times daily. 03/03/23   [provider]  HYDROcodone -acetaminophen  (NORCO/VICODIN) 5-325 MG tablet Take one tab po q 4 hrs prn pain 07/30/23   Triplett, Tammy, PA-C  hydrocortisone  (ANUSOL -HC) 2.5 % rectal cream Place 1 Application rectally 2 (two) times daily. For 10 days 08/05/23   Lanney Pitts, PA-C  hydroxychloroquine  (PLAQUENIL ) 200 MG tablet Take 1.5 tablets (300 mg total) by mouth daily. 05/05/23   Matt Song, MD  ibuprofen  (ADVIL ) 800 MG tablet Take 800 mg by mouth 2 (two) times daily as needed. 08/20/23   [provider]  metoprolol  tartrate (LOPRESSOR ) 50 MG tablet Take 50 mg by mouth 2 (two) times daily. 01/06/20   [provider]  mirtazapine  (REMERON ) 15 MG tablet Take 15 mg by mouth at bedtime as needed (for sleep). 08/10/21   [provider]  nitroGLYCERIN  (NITROSTAT ) 0.4 MG SL tablet Place 1 tablet (0.4 mg total) under the tongue every 5 (five) minutes as needed for chest pain. 12/29/19 08/05/23  Patwardhan, Kaye Parsons, MD  ondansetron  (ZOFRAN -ODT) 4 MG disintegrating tablet Take 4 mg by mouth every 4 (four) hours as needed. 07/14/23   [provider]  pantoprazole  (  PROTONIX ) 40 MG tablet Take 1 tablet (40 mg total) by mouth daily before breakfast. 08/05/23   Lanney Pitts, PA-C  polyethylene glycol powder (GLYCOLAX /MIRALAX ) 17 GM/SCOOP powder Take two capfuls once daily until soft stool, then continue one capful daily 08/05/23   Lanney Pitts, PA-C  Rimegepant Sulfate  (NURTEC) 75 MG TBDP Take 75 mg by mouth as needed. Patient taking differently: Take 75 mg by mouth as needed (migraines). 09/12/21   Dala Dublin, MD  rosuvastatin  (CRESTOR ) 20 MG tablet Take 1 tablet (20 mg total) by mouth daily. 06/25/19 11/06/28   Patwardhan, Kaye Parsons, MD  thiamine  (VITAMIN B1) 100 MG tablet Take 100 mg by mouth daily. 04/26/23   [provider]      Allergies    Metoclopramide  and Codeine    Review of Systems   Review of Systems  Constitutional:  Negative for chills and fever.  HENT:  Negative for congestion and rhinorrhea.   Respiratory:  Positive for shortness of breath. Negative for cough.   Cardiovascular:  Positive for chest pain. Negative for palpitations.  Gastrointestinal:  Positive for nausea and vomiting. Negative for abdominal pain, blood in stool, constipation and diarrhea.  Genitourinary:  Negative for dysuria and hematuria.    Physical Exam Updated Vital Signs BP (!) 133/97 (BP Location: Right Arm)   Pulse 84   Temp 98.2 F (36.8 C) (Oral)   Resp 18   Ht 5\' 3"  (1.6 m)   Wt 74 kg   LMP  (LMP Unknown)   SpO2 98%   BMI 28.91 kg/m  Physical Exam Vitals and nursing note reviewed.  Constitutional:      General: She is not in acute distress.    Appearance: She is not ill-appearing or toxic-appearing.  Cardiovascular:     Rate and Rhythm: Normal rate.     Pulses:          Radial pulses are 2+ on the right side and 2+ on the left side.       Dorsalis pedis pulses are 2+ on the right side and 2+ on the left side.       Posterior tibial pulses are 2+ on the right side and 2+ on the left side.  Pulmonary:     Effort: Pulmonary effort is normal. No respiratory distress.     Breath sounds: Rhonchi present.     Comments: Patient speaking in full sentences with ease, satting well on room air without increased work of breathing.  She does have some faint rhonchi present in the bilateral lower lobes with slight expiratory prolongation. Abdominal:     Palpations: Abdomen is soft.     Tenderness: There is no abdominal tenderness.     Comments: Large old surgical incisions present on the abdomen.  Abdomen is soft and nontender.  No guarding or rebound.  Musculoskeletal:     Right lower leg:  No tenderness. No edema.     Left lower leg: No tenderness. No edema.     Comments: No edema or tenderness to the bilateral lower extremities.  Palpable pulses.  Skin:    General: Skin is warm and dry.  Neurological:     Mental Status: She is alert.     ED Results / Procedures / Treatments   Labs (all labs ordered are listed, but only abnormal results are displayed) Labs Reviewed  COMPREHENSIVE METABOLIC PANEL WITH GFR - Abnormal; Notable for the following components:      Result Value   Anion gap  4 (*)    All other components within normal limits  CBC WITH DIFFERENTIAL/PLATELET - Abnormal; Notable for the following components:   RBC 5.14 (*)    MCV 76.7 (*)    RDW 16.8 (*)    All other components within normal limits  D-DIMER, QUANTITATIVE  LIPASE, BLOOD  BRAIN NATRIURETIC PEPTIDE  RAPID URINE DRUG SCREEN, HOSP PERFORMED  URINALYSIS, ROUTINE W REFLEX MICROSCOPIC  TROPONIN I (HIGH SENSITIVITY)  TROPONIN I (HIGH SENSITIVITY)    EKG EKG Interpretation Date/Time:  Thursday August 21 2023 16:29:24 EDT Ventricular Rate:  82 PR Interval:  154 QRS Duration:  80 QT Interval:  410 QTC Calculation: 479 R Axis:   69  Text Interpretation: Normal sinus rhythm Normal ECG Confirmed by Hiawatha Lout ([id number]) on 08/21/2023 5:44:23 PM  Radiology DG Chest 2 View Result Date: 08/21/2023 CLINICAL DATA:  Chest pain. Left chest pain beginning last night. Nausea and vomiting. EXAM: CHEST - 2 VIEW COMPARISON:  07/12/2023 FINDINGS: Heart size and pulmonary vascularity are normal. Mild peribronchial thickening may indicate airways disease. No airspace disease or consolidation in the lungs. No pleural effusion or pneumothorax. Mediastinal contours appear intact. Calcification of the aorta. Surgical clips in the upper abdomen. IMPRESSION: Mild peribronchial thickening may indicate airways disease. No focal consolidation. Electronically Signed   By: Boyce Byes M.D.   On: 08/21/2023 17:46     Procedures Procedures   Medications Ordered in ED Medications  ipratropium-albuterol  (DUONEB) 0.5-2.5 (3) MG/3ML nebulizer solution 3 mL (3 mLs Nebulization Given 08/21/23 1806)    ED Course/ Medical Decision Making/ A&P                                Medical Decision Making Amount and/or Complexity of Data Reviewed Labs: ordered. Radiology: ordered.  Risk Prescription drug management.   58 y.o. female presents to the ER for evaluation of chest pain. Differential diagnosis includes but is not limited to ACS, pericarditis, myocarditis, aortic dissection, PE, pneumothorax, esophageal rupture, pneumonia, reflux/PUD, biliary disease, pancreatitis, costochondritis, anxiety. Vital signs mildly elevated BP at 133/97 otherwise unremarkable. Physical exam as noted above.   On previous chart evaluation, patient was seen in the ER for similar complaint on 07-12-2023 were she had lab work and a CT dissection study.  Did show mildly enlarged central pulmonary arteries to suggest pulmonary arterial hypertension and a left stable adrenal adenoma.  Calcific coronary artery and aortic arthrosclerosis.  Patient reports that she has a follow-up appointment with cardiology for cath later this month.  I independently reviewed and interpreted the patient's labs.  CBC without leukocytosis or anemia.  D-dimer within normal limits.  Lipase within normal is.  BMP within normal limits.  Troponin less than 2, pending delta.  CMP shows anion gap of 4 otherwise no electrolyte or LFT abnormality.  UDS pending.  Chest x-ray shows Mild peribronchial thickening may indicate airways disease. No focal consolidation.   EKG reviewed and interpreted by my attending and read as Normal sinus rhythm Normal ECG.   Currently pending second troponin, urinalysis, and re-evaluation.   The patient has symmetric and bilateral 2+ pulses.  Blood pressure very similar in bilateral upper arms.  She recently just had a CT dissection  study that did not show any aneurysm.  I have a low suspicion for any of these also given the reassuring D-dimer.  Doubt any PE.  Chest x-ray shows that she has some mild peribronchiolar thickening.  Respiratory came and assessed the patient at bedside reports that she does have rhonchi and wheezing and agreed with DuoNeb.  Current troponin is less than 2 but pending delta.  From reading the progress note from Dr. Eustacio Highman they did notice that she had some narrowing of her heart arteries and consider heart catheterization which is likely why she is being seen later for heart cath.  The patient reports that this chest pain is different however from looking at previous charts, this is usually how the patient's chest pain presents with left-sided chest pain going into the left arm.  She does have nausea and vomiting but again has no abdominal pain and no abdominal tenderness on palpation.  She is able to pass gas.  The patient reports that she does have chronic nausea with all of her abdominal surgeries and problems.  She reports "I just do not feel good".  She does not appear in any acute distress. Will hand off to oncoming shift at this time.   7:18 PM Care of TOVAH SLAVICK transferred to [facility] at the end of my shift as the patient will require reassessment once labs/imaging have resulted. Patient presentation, ED course, and plan of care discussed with review of all pertinent labs and imaging. Please see his/her note for further details regarding further ED course and disposition. Plan at time of handoff is follow up with second trop, consult cardiology. This may be altered or completely changed at the discretion of the oncoming team pending results of further workup.  Portions of this report may have been transcribed using voice recognition software. Every effort was made to ensure accuracy; however, inadvertent computerized transcription errors may be present.   Final Clinical Impression(s)  / ED Diagnoses Final diagnoses:  None    Rx / DC Orders ED Discharge Orders     None         Spence Dux, PA-C 08/21/23 1919    Mordecai Applebaum, MD 08/22/23 [phone]

## 2023-08-21 NOTE — ED Notes (Signed)
 ED Provider at bedside.

## 2023-08-21 NOTE — ED Triage Notes (Signed)
 Pt arrived via POV c/o left chest pain that began last night. Pt reports this led her to develop N/V. Pt reports chewing up an aspirin  and taking pain medication and zofran  w/o relief. Pt reports pain began when she laid down last night. Pt reports on June 16th she is going to [facility] for a cardiac cath procedure.

## 2023-08-21 NOTE — Assessment & Plan Note (Signed)
 Atypical chest pain with negative high sensitive troponin and negative EKG  Plan to continue telemetry monitoring Patient may benefit from home cardiac monitoring ED contacted cardiology on call and recommended [facility] cardiology consult, no need to transfer to [facility]

## 2023-08-21 NOTE — Assessment & Plan Note (Signed)
Continue blood pressure control with amlodipine and metoprolol.  

## 2023-08-21 NOTE — Assessment & Plan Note (Signed)
 No signs of acute flare.  Continue with hydroxychloroquine 

## 2023-08-21 NOTE — H&P (Signed)
 History and Physical    Patient: Angel Price:[id number] DOB: 01/15/1966 DOA: 08/21/2023 DOS: the patient was seen and examined on 08/21/2023 PCP: Wyvonna Heidelberg, MD  Patient coming from: Home  Chief Complaint:  Chief Complaint  Patient presents with   Chest Pain   HPI: Angel Price is a 58 y.o. female with medical history significant of alcohol use, anxiety, anemia, GERD, history of stroke, lupus, hypertension and sickle cell trait who presented with chest pressure and palpitations.  She has been at her usual state of health until 24 hrs prior to admission, when she noted episodic palpitations, not exertion related, moderate to severe in intensity, associated with chest pressure and dyspnea but not diaphoresis. This has been episodic with no improving or worsening factors. Denies any frank angina, PND, orthopnea or lower extremity edema.   Patient follows with Dr Filiberto Hug from cardiology for atypical chest pain. Her stress test did show abnormalities suggestive of significant coronary artery disease.  Follow up scheduled for 07/31 for possible coronary angiography.     Review of Systems: As mentioned in the history of present illness. All other systems reviewed and are negative. Past Medical History:  Diagnosis Date   Abscess    soft tissue   Adrenal mass (HCC)    Alcohol abuse    Anemia    Anxiety    Blood transfusion without reported diagnosis    Chronic abdominal pain    Chronic wound infection of abdomen    Colon polyp    colonoscopy 04/2014   Depression    Discoid lupus erythematosus    Diverticulosis    colonoscopy 04/2014 moderat pan colonic   Drug-seeking behavior    Gastritis    EGD 05/2014   Gastroparesis 01/2014   GERD (gastroesophageal reflux disease)    Hemorrhoid    internal large   Hiatal hernia    History of Billroth II operation    Hypertension    Lung nodule    CT 02/2014 needs repeat 1 month   Lung nodule < 6cm on CT 04/25/2014    Lupus    Malingering    Migraine    Nausea and vomiting    chronic, recurrent   Pancreatitis    Schatzki's ring    patent per EGD 04/2014   Sickle cell trait (HCC)    Stroke (HCC)    Suicide attempt (HCC)    Thyroid  disease 2000   overactive, radiation   Past Surgical History:  Procedure Laterality Date   ABDOMINAL HYSTERECTOMY  2013   [place]   ABDOMINAL HYSTERECTOMY     ABDOMINAL SURGERY     ADRENALECTOMY Right    AGILE CAPSULE N/A 01/05/2015   Procedure: AGILE CAPSULE;  Surgeon: Suzette Espy, MD;  [facility];  Service: Endoscopy;  Laterality: N/A;  0700   BALLOON DILATION N/A 10/01/2021   Procedure: BALLOON DILATION;  Surgeon: Vinetta Greening, DO;  Location: [facility];  Service: Endoscopy;  Laterality: N/A;   Billroth II procedure      [place], first 2000, 2005/2006.   bilroth 2     BIOPSY  05/20/2013   Procedure: BIOPSIES OF ASCENDING AND SIGMOID COLON;  Surgeon: Suzette Espy, MD;  Location: [facility];  Service: Endoscopy;;   BIOPSY  04/26/2014   Procedure: BIOPSIES;  Surgeon: Alyce Jubilee, MD;  Location: [facility];  Service: Endoscopy;;   BIOPSY  12/24/2018   Procedure: BIOPSY;  Surgeon: Suzette Espy, MD;  [facility]:  [facility];  Service: Endoscopy;;  colon   CHOLECYSTECTOMY     COLONOSCOPY     in [place]   COLONOSCOPY WITH PROPOFOL  N/A 05/20/2013   Dr.Rourk- inadequate prep, normal appearing rectum, grossly normal colon aside from pancolonic diverticula, normal terminal ileum bx= unremarkable colonic mucosa. Due for early interval 2016.    COLONOSCOPY WITH PROPOFOL  N/A 04/26/2014   VHQ:IONGEX ileum/one colon polyp removed/moderate pan-colonic diverticulosis/large internal hemorrhoids   COLONOSCOPY WITH PROPOFOL  N/A 12/23/2014   Dr.Rourk- minimal internal hemorrhoids, pancolonic diverticulosis   COLONOSCOPY WITH PROPOFOL  N/A 08/23/2016   Procedure: COLONOSCOPY WITH PROPOFOL ;  Surgeon: Alyce Jubilee, MD;  [facility];  Service:  Endoscopy;  Laterality: N/A;   COLONOSCOPY WITH PROPOFOL  N/A 12/24/2018   Pancolonic diverticulosis, normal TI, one 5 mm polyp in rectum (tubular adenoma). Somewhat friable hemorrhagic mucosa in area of IC valve/cecum, possibly related to scope trauma s/p biopsy. Repeat in 7 years.   DEBRIDEMENT OF ABDOMINAL WALL ABSCESS N/A 02/08/2013   Procedure: DEBRIDEMENT OF ABDOMINAL WALL ABSCESS;  Surgeon: Beau Bound, MD;  Location: [facility];  Service: General;  Laterality: N/A;   ESOPHAGOGASTRODUODENOSCOPY (EGD) WITH PROPOFOL  N/A 05/20/2013   Dr.Rourk- s/p prior gastric surgery with normal esophagus, residual gastric mucosa and patent efferent limb   ESOPHAGOGASTRODUODENOSCOPY (EGD) WITH PROPOFOL  N/A 02/03/2014   Dr. Riley Cheadle:  s/p hemigastrectomy with retained gastric contents. Residual gastric mucosa and efferent limb appeared normal otherwise. Query gastroparesis.    ESOPHAGOGASTRODUODENOSCOPY (EGD) WITH PROPOFOL  N/A 04/26/2014   BMW:UXLKGMWN'U ring/small HH/mild non-erosive gasrtitis/normal anastomosis   ESOPHAGOGASTRODUODENOSCOPY (EGD) WITH PROPOFOL  N/A 12/23/2014   Dr.Rourk- s/p prior hemigastrctomy, active oozing from anastomotic suture site, hemostasis achieved   ESOPHAGOGASTRODUODENOSCOPY (EGD) WITH PROPOFOL  N/A 05/23/2016   Dr. 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    ESOPHAGOGASTRODUODENOSCOPY (EGD) WITH PROPOFOL  N/A 05/11/2017   Procedure: ESOPHAGOGASTRODUODENOSCOPY (EGD) WITH PROPOFOL ;  Surgeon: Suzette Espy, MD;  [facility];  Service: Endoscopy;  Laterality: N/A;   ESOPHAGOGASTRODUODENOSCOPY (EGD) WITH PROPOFOL  N/A 06/07/2021   Procedure: ESOPHAGOGASTRODUODENOSCOPY (EGD) WITH PROPOFOL ;  Surgeon: Suzette Espy, MD;  [facility];  Service: Endoscopy;  Laterality: N/A;  11:45am   ESOPHAGOGASTRODUODENOSCOPY (EGD) WITH PROPOFOL  N/A 10/01/2021   Procedure:  ESOPHAGOGASTRODUODENOSCOPY (EGD) WITH PROPOFOL ;  Surgeon: Vinetta Greening, DO;  [facility];  Service: Endoscopy;  Laterality: N/A;  3:00pm   EXCISION OF MESH  08/2019   HEMORRHOID SURGERY N/A 06/18/2017   Procedure: THREE COLUMN EXTENSIVE HEMORRHOIDECTOMY;  Surgeon: Awilda Bogus, MD;  Location: [facility];  Service: General;  Laterality: N/A;   HERNIA REPAIR     INCISION AND DRAINAGE ABSCESS N/A 01/06/2017   Procedure: INCISION AND DRAINAGE ABDOMINAL WALL ABSCESS;  Surgeon: Alanda Allegra, MD;  Location: [facility];  Service: General;  Laterality: N/A;   incisional hernial repair  09/05/2021   [place]   LEFT HEART CATH AND CORONARY ANGIOGRAPHY N/A 05/09/2020   Procedure: LEFT HEART CATH AND CORONARY ANGIOGRAPHY;  Surgeon: Cody Das, MD;  [facility];  Service: Cardiovascular;  Laterality: N/A;   POLYPECTOMY  12/24/2018   Procedure: POLYPECTOMY;  Surgeon: Suzette Espy, MD;  [facility];  Service: Endoscopy;;  colon   tendon repar Right    wrist   WOUND EXPLORATION Right 06/24/2013   Procedure: exploration of traumatic wound right wrist;  Surgeon: Milagros Alf, MD;  [facility]  OR;  Service: Orthopedics;  Laterality: Right;   Social History:  reports that she has been smoking cigarettes. She has a 8.8 pack-year smoking history. She has been exposed to tobacco smoke. She has never used smokeless tobacco. She reports that she does not currently use alcohol. She reports that she does not currently use drugs after having used the following drugs: Cocaine.  Allergies  Allergen Reactions   Metoclopramide  Anaphylaxis and Other (See Comments)    Reglan  EPS symptoms  Jerking movements   Codeine Hives    Family History  Problem Relation Age of Onset   Drug abuse Mother    Lung cancer Father    Cancer Father        mets   Drug abuse Sister    Drug abuse Brother    Breast cancer Maternal Aunt    Bipolar disorder Maternal Aunt    Drug  abuse Maternal Aunt    Colon cancer Maternal Grandmother        late 24s, early 83s   Bipolar disorder Paternal Grandfather    Brain cancer Son    Schizophrenia Son    Cancer Son        brain   Bipolar disorder Cousin    Liver disease Neg Hx     Prior to Admission medications   Medication Sig Start Date End Date Taking? Authorizing Provider  amLODipine  (NORVASC ) 10 MG tablet Take 10 mg by mouth daily. 05/18/21  Yes [provider]  aspirin  EC 81 MG EC tablet Take 1 tablet (81 mg total) by mouth daily. 05/19/19  Yes Elgergawy, Ardia Kraft, MD  Cholecalciferol  50 MCG (2000 UT) CAPS Take 1 tablet by mouth daily.   Yes [provider]  cyclobenzaprine  (FLEXERIL ) 5 MG tablet Take 1 tablet (5 mg total) by mouth 2 (two) times daily as needed for muscle spasms. 07/12/23  Yes Small, Brooke L, PA  DULoxetine  (CYMBALTA ) 30 MG capsule Take 30 mg by mouth 2 (two) times daily. 08/14/21  Yes [provider]  gabapentin  (NEURONTIN ) 100 MG capsule Take 100 mg by mouth 3 (three) times daily. 03/03/23  Yes [provider]  HYDROcodone -acetaminophen  (NORCO/VICODIN) 5-325 MG tablet Take one tab po q 4 hrs prn pain Patient taking differently: Take 1 tablet by mouth every 4 (four) hours as needed for moderate pain (pain score 4-6). Take one tab po q 4 hrs prn pain 07/30/23  Yes Triplett, Tammy, PA-C  hydroxychloroquine  (PLAQUENIL ) 200 MG tablet Take 1.5 tablets (300 mg total) by mouth daily. 05/05/23  Yes Rice, Haig Levan, MD  ibuprofen  (ADVIL ) 800 MG tablet Take 800 mg by mouth 2 (two) times daily as needed for mild pain (pain score 1-3). 08/20/23  Yes [provider]  metoprolol  tartrate (LOPRESSOR ) 50 MG tablet Take 50 mg by mouth 2 (two) times daily. 01/06/20  Yes [provider]  mirtazapine  (REMERON ) 15 MG tablet Take 15 mg by mouth at bedtime as needed (for sleep). 08/10/21  Yes [provider]  nitroGLYCERIN  (NITROSTAT ) 0.4 MG SL tablet Place 1 tablet  (0.4 mg total) under the tongue every 5 (five) minutes as needed for chest pain. 12/29/19 08/21/23 Yes Patwardhan, Kaye Parsons, MD  ondansetron  (ZOFRAN -ODT) 4 MG disintegrating tablet Take 4 mg by mouth every 4 (four) hours as needed for nausea or vomiting. 07/14/23  Yes [provider]  pantoprazole  (PROTONIX ) 40 MG tablet Take 1 tablet (40 mg total) by mouth daily before breakfast. 08/05/23  Yes Lanney Pitts, PA-C  Rimegepant Sulfate  (NURTEC) 75 MG TBDP Take 75 mg by mouth as needed. Patient taking differently: Take 75 mg by mouth as needed (migraines). 09/12/21  Yes Dala Dublin, MD  rosuvastatin  (CRESTOR ) 20 MG tablet Take 1 tablet (20 mg total) by mouth daily. 06/25/19 11/06/28 Yes Patwardhan, Manish J, MD  thiamine  (VITAMIN B1) 100 MG tablet Take 100 mg by mouth daily. 04/26/23  Yes [provider]    Physical Exam: Vitals:   08/21/23 2130 08/21/23 2230 08/21/23 2300 08/21/23 2330  BP:  113/82 103/77 110/75  Pulse: 77 73 74 70  Resp:  20 (!) 22 17  Temp:  97.9 F (36.6 C) 98 F (36.7 C)   TempSrc:  Oral Oral   SpO2: 93% 96% 93% 95%  Weight:      Height:       Neurology awake and alert ENT with mild pallor Cardiovascular with S1 and S2 present and regular with no gallops, rubs or murmurs Respiratory with no rales or wheezing, no rhonchi  Abdomen with no distention  No lower extremity edema   Data Reviewed:   Na 137, K 3,7 Cl 111 bicarbonate 22, glucose 74 bun 7 cr 0,46  High sensitive troponin < 2 and < 2  BNP 10  Wbc 6,9 hgb 13.8 plt 302  D dimer 0,44  Toxicology positive for opiates   Chest radiograph with no cardiomegaly, with no infiltrates or effusions.   EKG 82 bpm, normal axis, normal intervals, qtc 479, sinus rhythm with no significant ST segment or T wave changes.   Assessment and Plan: * Chest pain Atypical chest pain with negative high sensitive troponin and negative EKG  Plan to continue telemetry monitoring Patient may benefit from home  cardiac monitoring ED contacted cardiology on call and recommended [facility] cardiology consult, no need to transfer to [facility].   Essential hypertension Continue blood pressure control with amlodipine  and metoprolol .   SLE (systemic lupus erythematosus) (HCC) No signs of acute flare.  Continue with hydroxychloroquine     Dyslipidemia Continue with statin therapy   History of schizoaffective disorder Continue mirtazapine , duloxetine , and gabapentin .  As needed hydrocodone  and acetaminophen .    Advance Care Planning:   Code Status: Full Code   Consults: Cardiology   Family Communication: no family at the bedside   Severity of Illness: The appropriate patient status for this patient is OBSERVATION. Observation status is judged to be reasonable and necessary in order to provide the required intensity of service to ensure the patient's safety. The patient's presenting symptoms, physical exam findings, and initial radiographic and laboratory data in the context of their medical condition is felt to place them at decreased risk for further clinical deterioration. Furthermore, it is anticipated that the patient will be medically stable for discharge from the hospital within 2 midnights of admission.   Author: Albertus Alt, MD 08/21/2023 11:42 PM  For on call review [email or web address].

## 2023-08-21 NOTE — ED Provider Notes (Signed)
   Patient signed out to me by [facility] Ranson, PA-C pending completion of workup.  Patient here with history of lupus, alcohol use, gastroparesis, sickle cell trait, hypertension, coronary artery disease here with left-sided chest pain  States that she has a history of left chest pain and is followed by cardiology, but she states that she had a atypical pain that began yesterday.  She describes having palpitations and intermittent "hard beats" to her chest.  She states that heartbeats cause shortness of breath.  She has had some nausea and vomiting with her chest pain symptoms as well but states that she has chronic nausea believes the vomiting is secondary to her pain. She had cardiac CT on 08/19/2023 and states she is scheduled to have cardiac catheterization on 616.  She is followed by Dr. Filiberto Hug  See previous provider note for complete H&P  Labs show positive opiates on UDS she is prescribed opiates for pain control.  She has had 2 negative troponins and her D-dimer is reassuring.  EKG shows a normal sinus rhythm  Consulted with cardiology, Dr. Sanjuana Crutch who recommends hospital admission for obs and cardiology consult in the morning  Discussed with [facility] hospitalist, Dr. Sunnie England who agrees to admit     Catherne Clubs, PA-C 08/21/23 2243    Mordecai Applebaum, MD 08/22/23 [phone]

## 2023-08-22 ENCOUNTER — Encounter (HOSPITAL_COMMUNITY)
Admission: EM | Disposition: A | Discharge status: Home / Self Care | Payer: Self-pay | Source: Home / Self Care | Attending: Emergency Medicine

## 2023-08-22 DIAGNOSIS — I2510 Atherosclerotic heart disease of native coronary artery without angina pectoris: Secondary | ICD-10-CM

## 2023-08-22 DIAGNOSIS — E785 Hyperlipidemia, unspecified: Secondary | ICD-10-CM | POA: Diagnosis not present

## 2023-08-22 DIAGNOSIS — R0789 Other chest pain: Secondary | ICD-10-CM | POA: Diagnosis not present

## 2023-08-22 DIAGNOSIS — I10 Essential (primary) hypertension: Secondary | ICD-10-CM | POA: Diagnosis not present

## 2023-08-22 DIAGNOSIS — Z8659 Personal history of other mental and behavioral disorders: Secondary | ICD-10-CM | POA: Diagnosis not present

## 2023-08-22 HISTORY — PX: LEFT HEART CATH AND CORONARY ANGIOGRAPHY: CATH118249

## 2023-08-22 LAB — BASIC METABOLIC PANEL WITH GFR
Anion gap: 9 (ref 5–15)
BUN: 8 mg/dL (ref 6–20)
CO2: 24 mmol/L (ref 22–32)
Calcium: 8.6 mg/dL — ABNORMAL LOW (ref 8.9–10.3)
Chloride: 109 mmol/L (ref 98–111)
Creatinine, Ser: 0.41 mg/dL — ABNORMAL LOW (ref 0.44–1.00)
GFR, Estimated: >60 mL/min (ref >60)
Glucose, Bld: 85 mg/dL (ref 70–99)
Potassium: 3.2 mmol/L — ABNORMAL LOW (ref 3.5–5.1)
Sodium: 142 mmol/L (ref 135–145)

## 2023-08-22 LAB — CBC
HCT: 34.6 % — ABNORMAL LOW (ref 36.0–46.0)
Hemoglobin: 11.8 g/dL — ABNORMAL LOW (ref 12.0–15.0)
MCH: 25.7 pg — ABNORMAL LOW (ref 26.0–34.0)
MCHC: 34.1 g/dL (ref 30.0–36.0)
MCV: 75.4 fL — ABNORMAL LOW (ref 80.0–100.0)
Platelets: 276 10*3/uL (ref 150–400)
RBC: 4.59 MIL/uL (ref 3.87–5.11)
RDW: 16.4 % — ABNORMAL HIGH (ref 11.5–15.5)
WBC: 5.5 10*3/uL (ref 4.0–10.5)
nRBC: 0 % (ref 0.0–0.2)

## 2023-08-22 LAB — IRON AND TIBC
Iron: 53 ug/dL (ref 28–170)
Saturation Ratios: 12 % (ref 10.4–31.8)
TIBC: 439 ug/dL (ref 250–450)
UIBC: 386 ug/dL

## 2023-08-22 LAB — MAGNESIUM: Magnesium: 2 mg/dL (ref 1.7–2.4)

## 2023-08-22 LAB — FERRITIN: Ferritin: 9 ng/mL — ABNORMAL LOW (ref 11–307)

## 2023-08-22 LAB — VITAMIN B12: Vitamin B-12: 126 pg/mL — ABNORMAL LOW (ref 180–914)

## 2023-08-22 LAB — FOLATE: Folate: 19.6 ng/mL (ref >5.9)

## 2023-08-22 LAB — RETICULOCYTES
Immature Retic Fract: 12 % (ref 2.3–15.9)
RBC.: 4.51 MIL/uL (ref 3.87–5.11)
Retic Count, Absolute: 74.9 10*3/uL (ref 19.0–186.0)
Retic Ct Pct: 1.7 % (ref 0.4–3.1)

## 2023-08-22 MED ORDER — SODIUM CHLORIDE 0.9% FLUSH
3.0000 mL | Freq: Two times a day (BID) | INTRAVENOUS | Status: DC
  Administered 2023-08-22 – 2023-08-23 (×2): 3 mL via INTRAVENOUS

## 2023-08-22 MED ORDER — MORPHINE SULFATE (PF) 2 MG/ML IV SOLN: 1.0000 mg | INTRAVENOUS | Status: DC | PRN

## 2023-08-22 MED ORDER — MIDAZOLAM HCL 2 MG/2ML IJ SOLN
INTRAMUSCULAR | Status: AC
Start: 2023-08-22 — End: ?
  Filled 2023-08-22: qty 2

## 2023-08-22 MED ORDER — ASPIRIN 81 MG PO CHEW: 81.0000 mg | CHEWABLE_TABLET | ORAL | Status: DC

## 2023-08-22 MED ORDER — SODIUM CHLORIDE 0.9 % WEIGHT BASED INFUSION
1.0000 mL/kg/h | INTRAVENOUS | Status: DC
  Administered 2023-08-22: 1 mL/kg/h via INTRAVENOUS

## 2023-08-22 MED ORDER — ENOXAPARIN SODIUM 40 MG/0.4ML IJ SOSY
40.0000 mg | PREFILLED_SYRINGE | INTRAMUSCULAR | Status: DC
  Administered 2023-08-23: 40 mg via SUBCUTANEOUS
  Filled 2023-08-22: qty 0.4

## 2023-08-22 MED ORDER — VERAPAMIL HCL 2.5 MG/ML IV SOLN
INTRAVENOUS | Status: DC | PRN
  Administered 2023-08-22: 10 mL via INTRA_ARTERIAL

## 2023-08-22 MED ORDER — FENTANYL CITRATE (PF) 100 MCG/2ML IJ SOLN
INTRAMUSCULAR | Status: DC | PRN
  Administered 2023-08-22: 25 ug via INTRAVENOUS

## 2023-08-22 MED ORDER — SODIUM CHLORIDE 0.9 % IV SOLN: INTRAVENOUS | Status: AC

## 2023-08-22 MED ORDER — SODIUM CHLORIDE 0.9 % WEIGHT BASED INFUSION
3.0000 mL/kg/h | INTRAVENOUS | Status: DC
  Administered 2023-08-22: 3 mL/kg/h via INTRAVENOUS

## 2023-08-22 MED ORDER — POTASSIUM CHLORIDE CRYS ER 20 MEQ PO TBCR
40.0000 meq | EXTENDED_RELEASE_TABLET | Freq: Once | ORAL | Status: AC
  Administered 2023-08-22: 40 meq via ORAL
  Filled 2023-08-22: qty 2

## 2023-08-22 MED ORDER — HEPARIN SODIUM (PORCINE) 1000 UNIT/ML IJ SOLN
INTRAMUSCULAR | Status: AC
  Filled 2023-08-22: qty 10

## 2023-08-22 MED ORDER — FENTANYL CITRATE (PF) 100 MCG/2ML IJ SOLN
INTRAMUSCULAR | Status: AC
  Filled 2023-08-22: qty 2

## 2023-08-22 MED ORDER — LIDOCAINE HCL (PF) 1 % IJ SOLN
INTRAMUSCULAR | Status: DC | PRN
Start: 2023-08-22 — End: 2023-08-22
  Administered 2023-08-22: 2 mL

## 2023-08-22 MED ORDER — LIDOCAINE HCL (PF) 1 % IJ SOLN
INTRAMUSCULAR | Status: AC
  Filled 2023-08-22: qty 30

## 2023-08-22 MED ORDER — SODIUM CHLORIDE 0.9 % IV SOLN: 250.0000 mL | INTRAVENOUS | Status: DC | PRN

## 2023-08-22 MED ORDER — IOHEXOL 350 MG/ML SOLN
INTRAVENOUS | Status: DC | PRN
  Administered 2023-08-22: 30 mL

## 2023-08-22 MED ORDER — VERAPAMIL HCL 2.5 MG/ML IV SOLN
INTRAVENOUS | Status: AC
  Filled 2023-08-22: qty 2

## 2023-08-22 MED ORDER — LABETALOL HCL 5 MG/ML IV SOLN: 10.0000 mg | INTRAVENOUS | Status: AC | PRN

## 2023-08-22 MED ORDER — ORAL CARE MOUTH RINSE: 15.0000 mL | OROMUCOSAL | Status: DC | PRN

## 2023-08-22 MED ORDER — MIDAZOLAM HCL 2 MG/2ML IJ SOLN
INTRAMUSCULAR | Status: DC | PRN
  Administered 2023-08-22: 1 mg via INTRAVENOUS

## 2023-08-22 MED ORDER — HYDRALAZINE HCL 20 MG/ML IJ SOLN: 10.0000 mg | INTRAMUSCULAR | Status: AC | PRN

## 2023-08-22 MED ORDER — HEPARIN (PORCINE) IN NACL 1000-0.9 UT/500ML-% IV SOLN
INTRAVENOUS | Status: DC | PRN
  Administered 2023-08-22 (×2): 500 mL

## 2023-08-22 MED ORDER — HEPARIN SODIUM (PORCINE) 1000 UNIT/ML IJ SOLN
INTRAMUSCULAR | Status: DC | PRN
Start: 2023-08-22 — End: 2023-08-22
  Administered 2023-08-22: 4000 [IU] via INTRAVENOUS

## 2023-08-22 MED ORDER — SODIUM CHLORIDE 0.9% FLUSH: 3.0000 mL | INTRAVENOUS | Status: DC | PRN

## 2023-08-22 NOTE — Interval H&P Note (Signed)
 History and Physical Interval Note:  08/22/2023 4:10 PM  Angel Price  has presented today for surgery, with the diagnosis of chest pain - nstemi.  The various methods of treatment have been discussed with the patient and family. After consideration of risks, benefits and other options for treatment, the patient has consented to  Procedure(s): LEFT HEART CATH AND CORONARY ANGIOGRAPHY (N/A) as a surgical intervention.  The patient's history has been reviewed, patient examined, no change in status, stable for surgery.  I have reviewed the patient's chart and labs.  Questions were answered to the patient's satisfaction.    Cath Lab Visit (complete for each Cath Lab visit)  Clinical Evaluation Leading to the Procedure:   ACS: No.  Non-ACS:    Anginal Classification: CCS III  Anti-ischemic medical therapy: Maximal Therapy (2 or more classes of medications)  Non-Invasive Test Results: No non-invasive testing performed  Prior CABG: No previous CABG        Antoinette Batman

## 2023-08-22 NOTE — Plan of Care (Signed)

## 2023-08-22 NOTE — Assessment & Plan Note (Signed)
 Continue with statin therapy.  ?

## 2023-08-22 NOTE — Progress Notes (Signed)
 Patient consented for procedure.  Contact, son, verified for update post procedure.

## 2023-08-22 NOTE — Progress Notes (Addendum)
 PROGRESS NOTE   Angel Price  JYN:[id number] DOB: Jun 10, 1965 DOA: 08/21/2023 PCP: Angel Heidelberg, MD   Chief Complaint  Patient presents with   Chest Pain   Level of care: Telemetry Cardiac  Brief Admission History:   58 y.o. female with medical history significant of alcohol use, anxiety, anemia, GERD, history of stroke, lupus, hypertension and sickle cell trait who presented with chest pressure and palpitations.  She has been at her usual state of health until 24 hrs prior to admission, when she noted episodic palpitations, not exertion related, moderate to severe in intensity, associated with chest pressure and dyspnea but not diaphoresis. This has been episodic with no improving or worsening factors. Denies any frank angina, PND, orthopnea or lower extremity edema.    Patient follows with Dr Filiberto Hug from cardiology for atypical chest pain. Her stress test did show abnormalities suggestive of significant coronary artery disease. Follow up scheduled for 07/31 for possible coronary angiography.  Pt was evaluated by inpatient cardiology team at [facility] and request made to transfer to [facility] for cath on 6/6.    Assessment and Plan: Chest pain Discussed with cardiology service at [facility] They have requested transfer to [facility] for cath later today continue telemetry monitoring  Dyslipidemia Continue with statin therapy   Hypokalemia - oral replacement given   Microcytic anemia - follow up anemia panel (pending)  Essential hypertension Continue blood pressure control with metoprolol  50 mg BID   SLE (systemic lupus erythematosus) No signs of acute flare.  Continue with hydroxychloroquine    History of schizoaffective disorder Continue mirtazapine , duloxetine , and gabapentin .  As needed hydrocodone  and acetaminophen .   DVT prophylaxis: enoxaparin   Code Status: Full  Family Communication:  Disposition: transfer to [facility] for cath    Consultants:  cardiology  Procedures:  Cath  planned for 6/6 at [facility]   Antimicrobials:    Subjective: Pt says she is still having intermittent bouts of chest pain, agreeable to transfer to [facility] for cath.  She denies SOB.    Objective: Vitals:   08/21/23 2330 08/21/23 2345 08/22/23 0517 08/22/23 0850  BP: 110/75 108/80 96/69 126/80  Pulse: 70 72 60 60  Resp: 17 16 18    Temp:  98.1 F (36.7 C) 98.3 F (36.8 C)   TempSrc:  Oral Oral   SpO2: 95% 100% 100%   Weight:  72.9 kg    Height:  5\' 3"  (1.6 m)     No intake or output data in the 24 hours ending 08/22/23 1002 Filed Weights   08/21/23 1625 08/21/23 2345  Weight: 74 kg 72.9 kg   Examination:  General exam: Appears calm and comfortable  Respiratory system: Clear to auscultation. Respiratory effort normal. Cardiovascular system: normal S1 & S2 heard. No JVD, murmurs, rubs, gallops or clicks. No pedal edema. Gastrointestinal system: Abdomen is nondistended, soft and nontender. No organomegaly or masses felt. Normal bowel sounds heard. Central nervous system: Alert and oriented. No focal neurological deficits. Extremities: Symmetric 5 x 5 power. Skin: No rashes, lesions or ulcers. Psychiatry: Judgement and insight appear normal. Mood & affect appropriate.   Data Reviewed: I have personally reviewed following labs and imaging studies  CBC: Recent Labs  Lab 08/21/23 1700 08/22/23 0505  WBC 6.9 5.5  NEUTROABS 3.3  --   HGB 13.8 11.8*  HCT 39.4 34.6*  MCV 76.7* 75.4*  PLT 302 276    Basic Metabolic Panel: Recent Labs  Lab 08/21/23 1700 08/22/23 0505  NA 137 142  K 3.7  3.2*  CL 111 109  CO2 22 24  GLUCOSE 74 85  BUN 7 8  CREATININE 0.46 0.41*  CALCIUM  8.9 8.6*  MG  --  2.0    CBG: No results for input(s): "GLUCAP" in the last 168 hours.  No results found for this or any previous visit (from the past 240 hours).   Radiology Studies: DG Chest 2 View Result Date: 08/21/2023 CLINICAL DATA:  Chest pain. Left chest pain beginning last night. Nausea and  vomiting. EXAM: CHEST - 2 VIEW COMPARISON:  07/12/2023 FINDINGS: Heart size and pulmonary vascularity are normal. Mild peribronchial thickening may indicate airways disease. No airspace disease or consolidation in the lungs. No pleural effusion or pneumothorax. Mediastinal contours appear intact. Calcification of the aorta. Surgical clips in the upper abdomen. IMPRESSION: Mild peribronchial thickening may indicate airways disease. No focal consolidation. Electronically Signed   By: Boyce Byes M.D.   On: 08/21/2023 17:46    Scheduled Meds:  aspirin  EC  81 mg Oral Daily   cholecalciferol   2,000 Units Oral Daily   DULoxetine   30 mg Oral BID   enoxaparin  (LOVENOX ) injection  40 mg Subcutaneous Q24H   gabapentin   100 mg Oral TID   hydroxychloroquine   300 mg Oral Daily   metoprolol  tartrate  50 mg Oral BID   pantoprazole   40 mg Oral QAC breakfast   rosuvastatin   20 mg Oral Daily   thiamine   100 mg Oral Daily   Continuous Infusions:  sodium chloride  Stopped (08/22/23 0933)   Followed by   sodium chloride  Stopped (08/22/23 0934)     LOS: 0 days   Time spent: 59 mins  [name], MD How to contact the [facility] Attending or Consulting provider 7A - 7P or covering provider during after hours 7P -7A, for this patient?  Check the care team in [facility] and look for a) attending/consulting [facility] provider listed and b) the [facility] team listed Log into [email or web address] to find provider on call.  Locate the [facility] provider you are looking for under [facility] and page to a number that you can be directly reached. If you still have difficulty reaching the provider, please page the [facility] (Director on Call) for the Hospitalists listed on amion for assistance.  08/22/2023, 10:02 AM

## 2023-08-22 NOTE — TOC CM/SW Note (Signed)
 Transition of Care [facility]) - Inpatient Brief Assessment   Patient Details  Name: Angel Price MRN: [id number] Date of Birth: [birth year]  Transition of Care [facility]) CM/SW Contact:    Cyndie Dredge, LCSWA Phone Number: 08/22/2023, 8:20 AM   Clinical Narrative:  Transition of Care Department [facility]) has reviewed patient and no TOC needs have been identified at this time. We will continue to monitor patient advancement through interdisciplinary progression rounds. If new patient transition needs arise, please place a TOC consult.   Transition of Care Asessment: Insurance and Status: Insurance coverage has been reviewed Patient has primary care physician: Yes Home environment has been reviewed: Single Family Home Prior level of function:: Independent Prior/Current Home Services: No current home services Social Drivers of Health Review: SDOH reviewed no interventions necessary Readmission risk has been reviewed: Yes Transition of care needs: no transition of care needs at this time

## 2023-08-22 NOTE — Consult Note (Addendum)
 Cardiology Consultation   Patient ID: [name] MRN: [id number]; DOB: 1965/09/07  Admit date: 08/21/2023 Date of Consult: 08/22/2023  PCP:  Wyvonna Heidelberg, MD   [facility] Providers Cardiologist:  Cody Das, MD        Patient Profile: Angel Price is a 58 y.o. female with a hx of CAD (s/p cath in 04/2020 showing mild to moderate nonobstructive CAD and slow-flow in all vessels suggesting possible microvascular dysfunction), SLE, HTN, schizoaffective disorder, chronic pain syndrome, tobacco use, alcohol use, migraine headaches, GERD and sickle cell trait who is being seen 08/22/2023 for the evaluation of chest pain at the request of Dr. Sunnie England.  History of Present Illness:  [name] was examined by Dr. Filiberto Hug in 06/2023 and reported more recent episodes of chest pain which would radiate into her left arm. A cardiac PET stress test was recommended for further assessment. She had been on DAPT for unclear reasons and Plavix  was discontinued. Prior notes had also mentioned possible atrial fibrillation but there was no definitive diagnosis of this and he did not recommend anticoagulation at that time. She was continued on ASA 81 mg daily, Lopressor  50 mg twice daily and Crestor  20 mg daily.  Her cardiac PET was performed on 08/20/2023 and showed a small reversible apical perfusion defect suggesting a small area of ischemia and myocardial blood flow reserve was reduced globally which could be due to microvascular disease versus severe multivessel disease. Was also noted to have severe coronary calcifications and the study was overall intermediate risk. Dr. Theodosia Fishman did recommend a repeat cardiac catheterization and a visit was arranged for 09/01/2023 to further discuss this.  She presented to [facility] ED yesterday afternoon for evaluation of chest pain with associated nausea and vomiting. In talking with the patient today, she reports having intermittent  episodes of chest pain for the past several months but developed worsening chest pain the day prior to admission. Says that she felt a sensation in her chest like a "car was starting up" and the discomfort would radiate into her left arm. Symptoms which occur at rest or with activity and last for several minutes and improved with deep breathing and rest. She does not have SL NTG to take at home. She reports worsening dyspnea on exertion and fatigue over the past few months. Says the most active thing she typically does is walk around the grocery store. She denies any recent orthopnea, PND or pitting edema. Reports good compliance with her current medications. She has reduced her tobacco use to only a few cigarettes each day.  Initial labs showed WBC 6.9, Hgb 13.8, platelets 302, Na+ 137, K+ 3.7 and creatinine 0.46.  AST 25 and ALT 29. D-dimer negative. BNP normal at 10. Initial and repeat Hs Troponin < 2. UDS positive for opiates but on Norco prior to admission. CXR showed mild peribronchial thickening but no focal consolidation. EKG shows NSR, HR 82 with no acute ST changes.   Was continued on her home medications. Repeat labs today show her hemoglobin is 11.8. Creatinine is at 0.41 and K+ low at 3.2 (will order supplementation). Reports having some intermittent episodes of pain overnight and these improved with administration of Morphine  and Hydrocodone .  Past Medical History:  Diagnosis Date   Abscess    soft tissue   Adrenal mass (HCC)    Alcohol abuse    Anemia    Anxiety    Blood transfusion without reported diagnosis    Chronic  abdominal pain    Chronic wound infection of abdomen    Colon polyp    colonoscopy 04/2014   Depression    Discoid lupus erythematosus    Diverticulosis    colonoscopy 04/2014 moderat pan colonic   Drug-seeking behavior    Gastritis    EGD 05/2014   Gastroparesis 01/2014   GERD (gastroesophageal reflux disease)    Hemorrhoid    internal large   Hiatal hernia     History of Billroth II operation    Hypertension    Lung nodule    CT 02/2014 needs repeat 1 month   Lung nodule < 6cm on CT 04/25/2014   Lupus    Malingering    Migraine    Nausea and vomiting    chronic, recurrent   Pancreatitis    Schatzki's ring    patent per EGD 04/2014   Sickle cell trait (HCC)    Stroke (HCC)    Suicide attempt (HCC)    Thyroid  disease 2000   overactive, radiation    Past Surgical History:  Procedure Laterality Date   ABDOMINAL HYSTERECTOMY  2013   [place]   ABDOMINAL HYSTERECTOMY     ABDOMINAL SURGERY     ADRENALECTOMY Right    AGILE CAPSULE N/A 01/05/2015   Procedure: AGILE CAPSULE;  Surgeon: Suzette Espy, MD;  [facility];  Service: Endoscopy;  Laterality: N/A;  0700   BALLOON DILATION N/A 10/01/2021   Procedure: BALLOON DILATION;  Surgeon: Vinetta Greening, DO;  [facility]: [facility];  Service: Endoscopy;  Laterality: N/A;   Billroth II procedure      [place], first 2000, 2005/2006.   bilroth 2     BIOPSY  05/20/2013   Procedure: BIOPSIES OF ASCENDING AND SIGMOID COLON;  Surgeon: Suzette Espy, MD;  Location: [facility];  Service: Endoscopy;;   BIOPSY  04/26/2014   Procedure: BIOPSIES;  Surgeon: Alyce Jubilee, MD;  Location: [facility];  Service: Endoscopy;;   BIOPSY  12/24/2018   Procedure: BIOPSY;  Surgeon: Suzette Espy, MD;  [facility];  Service: Endoscopy;;  colon   CHOLECYSTECTOMY     COLONOSCOPY     in [place]   COLONOSCOPY WITH PROPOFOL  N/A 05/20/2013   Dr.Rourk- inadequate prep, normal appearing rectum, grossly normal colon aside from pancolonic diverticula, normal terminal ileum bx= unremarkable colonic mucosa. Due for early interval 2016.    COLONOSCOPY WITH PROPOFOL  N/A 04/26/2014   EAV:[id number] ileum/one colon polyp removed/moderate pan-colonic diverticulosis/large internal hemorrhoids   COLONOSCOPY WITH PROPOFOL  N/A 12/23/2014   Dr.Rourk- minimal internal hemorrhoids, pancolonic diverticulosis    COLONOSCOPY WITH PROPOFOL  N/A 08/23/2016   Procedure: COLONOSCOPY WITH PROPOFOL ;  Surgeon: Alyce Jubilee, MD;  [facility];  Service: Endoscopy;  Laterality: N/A;   COLONOSCOPY WITH PROPOFOL  N/A 12/24/2018   Pancolonic diverticulosis, normal TI, one 5 mm polyp in rectum (tubular adenoma). Somewhat friable hemorrhagic mucosa in area of IC valve/cecum, possibly related to scope trauma s/p biopsy. Repeat in 7 years.   DEBRIDEMENT OF ABDOMINAL WALL ABSCESS N/A 02/08/2013   Procedure: DEBRIDEMENT OF ABDOMINAL WALL ABSCESS;  Surgeon: Beau Bound, MD;  Location: [facility];  Service: General;  Laterality: N/A;   ESOPHAGOGASTRODUODENOSCOPY (EGD) WITH PROPOFOL  N/A 05/20/2013   Dr.Rourk- s/p prior gastric surgery with normal esophagus, residual gastric mucosa and patent efferent limb   ESOPHAGOGASTRODUODENOSCOPY (EGD) WITH PROPOFOL  N/A 02/03/2014   Dr. Riley Cheadle:  s/p hemigastrectomy with retained gastric contents. Residual gastric mucosa and  efferent limb appeared normal otherwise. Query gastroparesis.    ESOPHAGOGASTRODUODENOSCOPY (EGD) WITH PROPOFOL  N/A 04/26/2014   ZOX:[id number] ring/small HH/mild non-erosive gasrtitis/normal anastomosis   ESOPHAGOGASTRODUODENOSCOPY (EGD) WITH PROPOFOL  N/A 12/23/2014   Dr.Rourk- s/p prior hemigastrctomy, active oozing from anastomotic suture site, hemostasis achieved   ESOPHAGOGASTRODUODENOSCOPY (EGD) WITH PROPOFOL  N/A 05/23/2016   Dr. 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    ESOPHAGOGASTRODUODENOSCOPY (EGD) WITH PROPOFOL  N/A 05/11/2017   Procedure: ESOPHAGOGASTRODUODENOSCOPY (EGD) WITH PROPOFOL ;  Surgeon: Suzette Espy, MD;  [facility];  Service: Endoscopy;  Laterality: N/A;   ESOPHAGOGASTRODUODENOSCOPY (EGD) WITH PROPOFOL  N/A 06/07/2021   Procedure: ESOPHAGOGASTRODUODENOSCOPY (EGD) WITH PROPOFOL ;  Surgeon: Suzette Espy, MD;  [facility];  Service: Endoscopy;  Laterality: N/A;  11:45am   ESOPHAGOGASTRODUODENOSCOPY (EGD) WITH PROPOFOL  N/A 10/01/2021   Procedure: ESOPHAGOGASTRODUODENOSCOPY (EGD) WITH PROPOFOL ;  Surgeon: Vinetta Greening, DO;  [facility];  Service: Endoscopy;  Laterality: N/A;  3:00pm   EXCISION OF MESH  08/2019   HEMORRHOID SURGERY N/A 06/18/2017   Procedure: THREE COLUMN EXTENSIVE HEMORRHOIDECTOMY;  Surgeon: Awilda Bogus, MD;  Location: [facility];  Service: General;  Laterality: N/A;   HERNIA REPAIR     INCISION AND DRAINAGE ABSCESS N/A 01/06/2017   Procedure: INCISION AND DRAINAGE ABDOMINAL WALL ABSCESS;  Surgeon: Alanda Allegra, MD;  Location: [facility];  Service: General;  Laterality: N/A;   incisional hernial repair  09/05/2021   [place]   LEFT HEART CATH AND CORONARY ANGIOGRAPHY N/A 05/09/2020   Procedure: LEFT HEART CATH AND CORONARY ANGIOGRAPHY;  Surgeon: Cody Das, MD;  [facility];  Service: Cardiovascular;  Laterality: N/A;   POLYPECTOMY  12/24/2018   Procedure: POLYPECTOMY;  Surgeon: Suzette Espy, MD;  [facility];  Service: Endoscopy;;  colon   tendon repar Right    wrist   WOUND EXPLORATION Right 06/24/2013   Procedure: exploration of traumatic wound right wrist;  Surgeon: Milagros Alf, MD;  Location: [facility] OR;  Service: Orthopedics;  Laterality: Right;     Home Medications:  Prior to Admission medications   Medication Sig Start Date End Date Taking? Authorizing Provider  amLODipine  (NORVASC ) 10 MG tablet Take 10 mg by mouth daily. 05/18/21  Yes [provider]  aspirin  EC 81 MG EC tablet Take 1 tablet (81 mg total) by mouth daily. 05/19/19  Yes Elgergawy, Ardia Kraft, MD  Cholecalciferol  50 MCG (2000 UT) CAPS Take 1 tablet by mouth daily.   Yes [provider]  cyclobenzaprine  (FLEXERIL ) 5 MG tablet Take 1 tablet (5 mg total) by mouth 2 (two) times daily as needed for muscle spasms. 07/12/23  Yes Small, Brooke L, PA   DULoxetine  (CYMBALTA ) 30 MG capsule Take 30 mg by mouth 2 (two) times daily. 08/14/21  Yes [provider]  gabapentin  (NEURONTIN ) 100 MG capsule Take 100 mg by mouth 3 (three) times daily. 03/03/23  Yes [provider]  HYDROcodone -acetaminophen  (NORCO/VICODIN) 5-325 MG tablet Take one tab po q 4 hrs prn pain Patient taking differently: Take 1 tablet by mouth every 4 (four) hours as needed for moderate pain (pain score 4-6). Take one tab po q 4 hrs prn pain 07/30/23  Yes Triplett, Tammy, PA-C  hydroxychloroquine  (PLAQUENIL ) 200 MG tablet Take 1.5 tablets (300 mg total) by mouth daily. 05/05/23  Yes Rice, Haig Levan, MD  ibuprofen  (ADVIL ) 800 MG tablet Take 800 mg  by mouth 2 (two) times daily as needed for mild pain (pain score 1-3). 08/20/23  Yes [provider]  metoprolol  tartrate (LOPRESSOR ) 50 MG tablet Take 50 mg by mouth 2 (two) times daily. 01/06/20  Yes [provider]  mirtazapine  (REMERON ) 15 MG tablet Take 15 mg by mouth at bedtime as needed (for sleep). 08/10/21  Yes [provider]  nitroGLYCERIN  (NITROSTAT ) 0.4 MG SL tablet Place 1 tablet (0.4 mg total) under the tongue every 5 (five) minutes as needed for chest pain. 12/29/19 08/21/23 Yes Patwardhan, Kaye Parsons, MD  ondansetron  (ZOFRAN -ODT) 4 MG disintegrating tablet Take 4 mg by mouth every 4 (four) hours as needed for nausea or vomiting. 07/14/23  Yes [provider]  pantoprazole  (PROTONIX ) 40 MG tablet Take 1 tablet (40 mg total) by mouth daily before breakfast. 08/05/23  Yes Lanney Pitts, PA-C  Rimegepant Sulfate  (NURTEC) 75 MG TBDP Take 75 mg by mouth as needed. Patient taking differently: Take 75 mg by mouth as needed (migraines). 09/12/21  Yes Dala Dublin, MD  rosuvastatin  (CRESTOR ) 20 MG tablet Take 1 tablet (20 mg total) by mouth daily. 06/25/19 11/06/28 Yes Patwardhan, Manish J, MD  thiamine  (VITAMIN B1) 100 MG tablet Take 100 mg by mouth daily. 04/26/23  Yes [provider]    Scheduled Meds:  amLODipine   10 mg Oral Daily   aspirin  EC  81 mg Oral Daily   cholecalciferol   2,000 Units Oral Daily   DULoxetine   30 mg Oral BID   enoxaparin  (LOVENOX ) injection  40 mg Subcutaneous Q24H   gabapentin   100 mg Oral TID   hydroxychloroquine   300 mg Oral Daily   metoprolol  tartrate  50 mg Oral BID   pantoprazole   40 mg Oral QAC breakfast   rosuvastatin   20 mg Oral Daily   thiamine   100 mg Oral Daily   Continuous Infusions:  PRN Meds: acetaminophen  **OR** acetaminophen , cyclobenzaprine , HYDROcodone -acetaminophen , mirtazapine , nitroGLYCERIN , ondansetron  **OR** ondansetron  (ZOFRAN ) IV, mouth rinse  Allergies:    Allergies  Allergen Reactions   Metoclopramide  Anaphylaxis and Other (See Comments)    Reglan  EPS symptoms  Jerking movements   Codeine Hives    Social History:   Social History   Socioeconomic History   Marital status: Divorced    Spouse name: Not on file   Number of children: 4   Years of education: 10   Highest education level: Not on file  Occupational History   Not on file  Tobacco Use   Smoking status: Every Day    Current packs/day: 0.25    Average packs/day: 0.3 packs/day for 35.0 years (8.8 ttl pk-yrs)    Types: Cigarettes    Passive exposure: Past   Smokeless tobacco: Never  Vaping Use   Vaping status: Never Used  Substance and Sexual Activity   Alcohol use: Not Currently   Drug use: Not Currently    Types: Cocaine    Comment: no cocaine in4  years per patient (08/20/21)   Sexual activity: Not Currently    Birth control/protection: Surgical  Other Topics Concern   Not on file  Social History Narrative   Caffeine- tea occass   Social Drivers of Health   Financial Resource Strain: Low Risk  (03/15/2019)   Overall Financial Resource Strain (CARDIA)    Difficulty of Paying Living Expenses: Not very hard  Food Insecurity: No Food Insecurity (08/21/2023)   Hunger Vital Sign    Worried About Running Out of Food  in the Last Year:  Never true    Ran Out of Food in the Last Year: Never true  Transportation Needs: No Transportation Needs (08/21/2023)   PRAPARE - Administrator, Civil Service (Medical): No    Lack of Transportation (Non-Medical): No  Physical Activity: Inactive (03/15/2019)   Exercise Vital Sign    Days of Exercise per Week: 0 days    Minutes of Exercise per Session: 0 min  Stress: Stress Concern Present (03/15/2019)   [employer] of Occupational Health - Occupational Stress Questionnaire    Feeling of Stress : Rather much  Social Connections: Moderately Isolated (03/15/2019)   Social Connection and Isolation Panel [NHANES]    Frequency of Communication with Friends and Family: More than three times a week    Frequency of Social Gatherings with Friends and Family: More than three times a week    Attends Religious Services: 1 to 4 times per year    Active Member of [employer] or Organizations: No    Attends Banker Meetings: Never    Marital Status: Divorced  Catering manager Violence: Not At Risk (08/21/2023)   Humiliation, Afraid, Rape, and Kick questionnaire    Fear of Current or Ex-Partner: No    Emotionally Abused: No    Physically Abused: No    Sexually Abused: No    Family History:    Family History  Problem Relation Age of Onset   Drug abuse Mother    Lung cancer Father    Cancer Father        mets   Drug abuse Sister    Drug abuse Brother    Breast cancer Maternal Aunt    Bipolar disorder Maternal Aunt    Drug abuse Maternal Aunt    Colon cancer Maternal Grandmother        late 33s, early 47s   Bipolar disorder Paternal Grandfather    Brain cancer Son    Schizophrenia Son    Cancer Son        brain   Bipolar disorder Cousin    Liver disease Neg Hx      ROS:  Please see the history of present illness.   All other ROS reviewed and negative.     Physical Exam/Data: Vitals:   08/21/23 2300 08/21/23 2330 08/21/23 2345 08/22/23  0517  BP: 103/77 110/75 108/80 96/69  Pulse: 74 70 72 60  Resp: (!) 22 17 16 18   Temp: 98 F (36.7 C)  98.1 F (36.7 C) 98.3 F (36.8 C)  TempSrc: Oral  Oral Oral  SpO2: 93% 95% 100% 100%  Weight:   72.9 kg   Height:   5\' 3"  (1.6 m)    No intake or output data in the 24 hours ending 08/22/23 0744    08/21/2023   11:45 PM 08/21/2023    4:25 PM 08/05/2023    2:26 PM  Last 3 Weights  Weight (lbs) 160 lb 11.5 oz 163 lb 3.2 oz 163 lb 3.2 oz  Weight (kg) 72.9 kg 74.027 kg 74.027 kg     Body mass index is 28.47 kg/m.  General:  Well nourished, well developed female appearing in no acute distress HEENT: normal Neck: no JVD Vascular: No carotid bruits; Distal pulses 2+ bilaterally Cardiac:  normal S1, S2; RRR; no murmur  Lungs:  clear to auscultation bilaterally, no wheezing, rhonchi or rales  Abd: soft, nontender, no hepatomegaly  Ext: no pitting edema Musculoskeletal:  No deformities, BUE and BLE strength normal and equal  Skin: warm and dry  Neuro:  CNs 2-12 intact, no focal abnormalities noted Psych:  Normal affect   EKG:  The EKG was personally reviewed and demonstrates: NSR, HR 82 with no acute ST changes.   Telemetry:  Telemetry was personally reviewed and demonstrates: Normal sinus rhythm with episodes of sinus bradycardia, heart rate in 50's to 60's.  Relevant CV Studies:  Cardiac Catheterization: 04/2020 LM: Normal LAD: Prox 20% calcific disease        Diag 2 ostial 50%, prox 40% stenoses LCx: No significant disease RCA: Distal RCA focal 40% stenosis   TIMI III flow in all vessels in spite of IC NTG suggests possible microvascular dysfunction  Echocardiogram: 02/2023 IMPRESSIONS     1. Left ventricular ejection fraction, by estimation, is 60 to 65%. The  left ventricle has normal function. The left ventricle has no regional  wall motion abnormalities. Left ventricular diastolic parameters were  normal.   2. Right ventricular systolic function is normal. The  right ventricular  size is normal. There is normal pulmonary artery systolic pressure.   3. The mitral valve is abnormal. Trivial mitral valve regurgitation. No  evidence of mitral stenosis.   4. The aortic valve is tricuspid. Aortic valve regurgitation is not  visualized. No aortic stenosis is present.   5. Aortic dilatation noted. There is mild dilatation of the aortic root,  measuring 38 mm.   6. The inferior vena cava is normal in size with greater than 50%  respiratory variability, suggesting right atrial pressure of 3 mmHg.    Cardiac PET: 08/2023   Small reversible apical perfusion defect, suggesting small area of ischemia.  Myocardial blood flow reserve is reduced globally (1.8) and in each coronary distribution.  No high risk findings such as TID or drop in EF with stress.  Severe coronary calcifications on CT.  Differential diagnosis includes microvascular disease vs severe multivessel CAD.  Overall, study is intermediate risk   LV perfusion is abnormal. There is evidence of ischemia. Defect 1: There is a medium defect with mild reduction in uptake present in the apical anterior, lateral and apex location(s) that is reversible. There is normal wall motion in the defect area. Consistent with ischemia.   Rest left ventricular function is normal. Rest EF: 60%. Stress left ventricular function is normal. Stress EF: 73%. End diastolic cavity size is normal. End systolic cavity size is normal.   Myocardial blood flow was computed to be 1.70ml/g/min at rest and 1.52ml/g/min at stress. Global myocardial blood flow reserve was 1.82 and was abnormal.   Coronary calcium  was present on the attenuation correction CT images. Severe coronary calcifications were present. Coronary calcifications were present in the left anterior descending artery, left circumflex artery and right coronary artery distribution(s).   Findings are consistent with ischemia. The study is intermediate risk.   Electronically  signed by Carson Clara, MD  Laboratory Data: High Sensitivity Troponin:   Recent Labs  Lab 08/21/23 1700 08/21/23 1921  TROPONINIHS <2 <2     Chemistry Recent Labs  Lab 08/21/23 1700 08/22/23 0505  NA 137 142  K 3.7 3.2*  CL 111 109  CO2 22 24  GLUCOSE 74 85  BUN 7 8  CREATININE 0.46 0.41*  CALCIUM  8.9 8.6*  GFRNONAA >60 >60  ANIONGAP 4* 9    Recent Labs  Lab 08/21/23 1700  PROT 7.6  ALBUMIN 4.0  AST 25  ALT 29  ALKPHOS 87  BILITOT 0.7   Lipids No results for  input(s): "CHOL", "TRIG", "HDL", "LABVLDL", "LDLCALC", "CHOLHDL" in the last 168 hours.  Hematology Recent Labs  Lab 08/21/23 1700 08/22/23 0505  WBC 6.9 5.5  RBC 5.14* 4.59  HGB 13.8 11.8*  HCT 39.4 34.6*  MCV 76.7* 75.4*  MCH 26.8 25.7*  MCHC 35.0 34.1  RDW 16.8* 16.4*  PLT 302 276   Thyroid  No results for input(s): "TSH", "FREET4" in the last 168 hours.  BNP Recent Labs  Lab 08/21/23 1700  BNP 10.0    DDimer  Recent Labs  Lab 08/21/23 1700  DDIMER 0.44    Radiology/Studies:  DG Chest 2 View Result Date: 08/21/2023 CLINICAL DATA:  Chest pain. Left chest pain beginning last night. Nausea and vomiting. EXAM: CHEST - 2 VIEW COMPARISON:  07/12/2023 FINDINGS: Heart size and pulmonary vascularity are normal. Mild peribronchial thickening may indicate airways disease. No airspace disease or consolidation in the lungs. No pleural effusion or pneumothorax. Mediastinal contours appear intact. Calcification of the aorta. Surgical clips in the upper abdomen. IMPRESSION: Mild peribronchial thickening may indicate airways disease. No focal consolidation. Electronically Signed   By: Boyce Byes M.D.   On: 08/21/2023 17:46   Assessment and Plan:  1. Chest Pain with Atypical Features/Abnormal Stress Test - Her recent episodes of chest pain have atypical qualities as discussed above and suspect anxiety might be playing a role as symptoms worsened after she was called with her stress test  results. Says that she has been unable to sleep since then as well. We reviewed that she has ruled out for ACS and EKG is without acute ST changes.  Reviewed options with the patient in regards to arranging for cardiac catheterization as outpatient as initially planned vs. pursuing this admission and she favors inpatient evaluation given her symptoms which is reasonable. Will arrange for [facility] today with Dr. Filiberto Hug  as he is actually in the catheterization lab today. - Continue ASA 81 mg daily, Lopressor  50 mg twice daily and Crestor  20 mg daily. She has been on Amlodipine  as discussed above for antianginal benefit but given her soft BP, will hold for now. No indication for IV Heparin  at this time. She did consume breakfast (small amount of eggs and grits at 0745) and cath lab made aware. Will be NPO going forward.   2. CAD - Cardiac catheterization in 2022 showed mild to moderate nonobstructive disease as outlined above with slow flow in all vessels suggesting possible microvascular dysfunction. Recent cardiac PET as discussed above showed a small area of ischemia and Dr. Filiberto Hug recommended a cardiac catheterization for definitive evaluation. - Will arrange for a repeat cardiac catheterization this admission.  - Continue ASA 81 mg daily, Lopressor  50 mg twice daily and Crestor  20 mg daily.  3. HLD - LDL was at 99 when checked in 02/2023. She has been on Crestor  20 mg daily. Will recheck an FLP this admission. If LDL remains above goal, would titrate to 40 mg daily.  4. HTN - BP has been well-controlled and actually soft at times, at 96/69 on most recent check.  She has been continued on Lopressor  50 mg twice daily and Amlodipine  10 mg daily. Will hold Amlodipine  for now given current BP but anticipate resuming prior to discharge as she has been on this for antianginal benefit.  5. Hypokalemia - K+ was at 3.2 this morning. Will order supplementation.  6. Chronic Pain  Syndrome/Lupus/Schizoaffective Disorder - Remains on chronic pain medications and is also on Plaquenil  for SLE. Management of chronic conditions  per Hospitalist Team.   For questions or updates, please contact  [facility] Please consult [email or web address] for contact info under    Signed, Dorma Gash, PA-C  08/22/2023 7:44 AM  Attending Note  Patient seen and discussed with PA Finis Hugger, I agree with her documentation above. 58 yo female history of mild to mod CAD with possibly microvasular dysfunction from prior cath, HTN, SLE, schizoaffective disorder, chronic pain syndrome, presents with chest pain. Follow as outpatient by Dr Filiberto Hug, recent stress PET as outlined before, considertions for outpatient cath. Presents with recurrent chest pains. Midchest raditating down left arm, occur at rest or with activity lasting several minutes. Some increased DOE over the last few months.    K 3.7 BUN 7 WBC 6.9 Hgb 13.8 Plt 302 Ddimer 0.44 BNP 10  Trop neg x 2.  EKG SR, no ischemic changes CXR: mild peribronchial thickening  08/2023 stress PET: small area of apical ishcemia, reduced blood flow reserve globally, severe coronary calcifications by CT. Consider severe mulitivessel disease vs microvasc disease. Intermediate risk  02/2023 echo: LVE 60-65%, no WMAs  1.Chest pain/CAD - EKG and troponins are benign - recent abnormal stress PET as outlined above. Plans were for outpatient cath per her primary cardiologist Dr Filiberto Hug. Presents with ongoing chest pain symptoms. We will plan for cath today with Dr Filiberto Hug.  - continue home ASA, lopressor  50mg  bid, crestor  20mg  daily.    Armida Lander MD

## 2023-08-22 NOTE — Progress Notes (Signed)
 TR band removed @ 1837

## 2023-08-22 NOTE — Assessment & Plan Note (Addendum)
 Continue mirtazapine , duloxetine , and gabapentin .  As needed hydrocodone  and acetaminophen .

## 2023-08-22 NOTE — Hospital Course (Signed)
 58 y.o. female with medical history significant of alcohol use, anxiety, anemia, GERD, history of stroke, lupus, hypertension and sickle cell trait who presented with chest pressure and palpitations.  She has been at her usual state of health until 24 hrs prior to admission, when she noted episodic palpitations, not exertion related, moderate to severe in intensity, associated with chest pressure and dyspnea but not diaphoresis. This has been episodic with no improving or worsening factors. Denies any frank angina, PND, orthopnea or lower extremity edema.    Patient follows with Dr Filiberto Hug from cardiology for atypical chest pain. Her stress test did show abnormalities suggestive of significant coronary artery disease. Follow up scheduled for 07/31 for possible coronary angiography.  Pt was evaluated by inpatient cardiology team at [facility] and request made to transfer to [facility] for cath on 6/6.

## 2023-08-22 NOTE — H&P (View-Only) (Signed)
 Cardiology Consultation   Patient ID: Angel Price MRN: [id number]; DOB: 1965/09/07  Admit date: 08/21/2023 Date of Consult: 08/22/2023  PCP:  Wyvonna Heidelberg, MD   [facility] Providers Cardiologist:  Cody Das, MD        Patient Profile: Angel Price is a 58 y.o. female with a hx of CAD (s/p cath in 04/2020 showing mild to moderate nonobstructive CAD and slow-flow in all vessels suggesting possible microvascular dysfunction), SLE, HTN, schizoaffective disorder, chronic pain syndrome, tobacco use, alcohol use, migraine headaches, GERD and sickle cell trait who is being seen 08/22/2023 for the evaluation of chest pain at the request of Dr. Sunnie England.  History of Present Illness:  Angel Price was examined by Dr. Filiberto Hug in 06/2023 and reported more recent episodes of chest pain which would radiate into her left arm. A cardiac PET stress test was recommended for further assessment. She had been on DAPT for unclear reasons and Plavix  was discontinued. Prior notes had also mentioned possible atrial fibrillation but there was no definitive diagnosis of this and he did not recommend anticoagulation at that time. She was continued on ASA 81 mg daily, Lopressor  50 mg twice daily and Crestor  20 mg daily.  Her cardiac PET was performed on 08/20/2023 and showed a small reversible apical perfusion defect suggesting a small area of ischemia and myocardial blood flow reserve was reduced globally which could be due to microvascular disease versus severe multivessel disease. Was also noted to have severe coronary calcifications and the study was overall intermediate risk. Dr. Theodosia Fishman did recommend a repeat cardiac catheterization and a visit was arranged for 09/01/2023 to further discuss this.  She presented to [facility] ED yesterday afternoon for evaluation of chest pain with associated nausea and vomiting. In talking with the patient today, she reports having intermittent  episodes of chest pain for the past several months but developed worsening chest pain the day prior to admission. Says that she felt a sensation in her chest like a "car was starting up" and the discomfort would radiate into her left arm. Symptoms which occur at rest or with activity and last for several minutes and improved with deep breathing and rest. She does not have SL NTG to take at home. She reports worsening dyspnea on exertion and fatigue over the past few months. Says the most active thing she typically does is walk around the grocery store. She denies any recent orthopnea, PND or pitting edema. Reports good compliance with her current medications. She has reduced her tobacco use to only a few cigarettes each day.  Initial labs showed WBC 6.9, Hgb 13.8, platelets 302, Na+ 137, K+ 3.7 and creatinine 0.46.  AST 25 and ALT 29. D-dimer negative. BNP normal at 10. Initial and repeat Hs Troponin < 2. UDS positive for opiates but on Norco prior to admission. CXR showed mild peribronchial thickening but no focal consolidation. EKG shows NSR, HR 82 with no acute ST changes.   Was continued on her home medications. Repeat labs today show her hemoglobin is 11.8. Creatinine is at 0.41 and K+ low at 3.2 (will order supplementation). Reports having some intermittent episodes of pain overnight and these improved with administration of Morphine  and Hydrocodone .  Past Medical History:  Diagnosis Date   Abscess    soft tissue   Adrenal mass (HCC)    Alcohol abuse    Anemia    Anxiety    Blood transfusion without reported diagnosis    Chronic  abdominal pain    Chronic wound infection of abdomen    Colon polyp    colonoscopy 04/2014   Depression    Discoid lupus erythematosus    Diverticulosis    colonoscopy 04/2014 moderat pan colonic   Drug-seeking behavior    Gastritis    EGD 05/2014   Gastroparesis 01/2014   GERD (gastroesophageal reflux disease)    Hemorrhoid    internal large   Hiatal hernia     History of Billroth II operation    Hypertension    Lung nodule    CT 02/2014 needs repeat 1 month   Lung nodule < 6cm on CT 04/25/2014   Lupus    Malingering    Migraine    Nausea and vomiting    chronic, recurrent   Pancreatitis    Schatzki's ring    patent per EGD 04/2014   Sickle cell trait (HCC)    Stroke (HCC)    Suicide attempt (HCC)    Thyroid  disease 2000   overactive, radiation    Past Surgical History:  Procedure Laterality Date   ABDOMINAL HYSTERECTOMY  2013   [place]   ABDOMINAL HYSTERECTOMY     ABDOMINAL SURGERY     ADRENALECTOMY Right    AGILE CAPSULE N/A 01/05/2015   Procedure: AGILE CAPSULE;  Surgeon: Suzette Espy, MD;  [facility];  Service: Endoscopy;  Laterality: N/A;  0700   BALLOON DILATION N/A 10/01/2021   Procedure: BALLOON DILATION;  Surgeon: Vinetta Greening, DO;  [facility];  Service: Endoscopy;  Laterality: N/A;   Billroth II procedure      [place], first 2000, 2005/2006.   bilroth 2     BIOPSY  05/20/2013   Procedure: BIOPSIES OF ASCENDING AND SIGMOID COLON;  Surgeon: Suzette Espy, MD;  Location: [facility];  Service: Endoscopy;;   BIOPSY  04/26/2014   Procedure: BIOPSIES;  Surgeon: Alyce Jubilee, MD;  Location: [facility];  Service: Endoscopy;;   BIOPSY  12/24/2018   Procedure: BIOPSY;  Surgeon: Suzette Espy, MD;  [facility];  Service: Endoscopy;;  colon   CHOLECYSTECTOMY     COLONOSCOPY     in [place]   COLONOSCOPY WITH PROPOFOL  N/A 05/20/2013   Dr.Rourk- inadequate prep, normal appearing rectum, grossly normal colon aside from pancolonic diverticula, normal terminal ileum bx= unremarkable colonic mucosa. Due for early interval 2016.    COLONOSCOPY WITH PROPOFOL  N/A 04/26/2014   ZOX:WRUEAV ileum/one colon polyp removed/moderate pan-colonic diverticulosis/large internal hemorrhoids   COLONOSCOPY WITH PROPOFOL  N/A 12/23/2014   Dr.Rourk- minimal internal hemorrhoids, pancolonic diverticulosis    COLONOSCOPY WITH PROPOFOL  N/A 08/23/2016   Procedure: COLONOSCOPY WITH PROPOFOL ;  Surgeon: Alyce Jubilee, MD;  [facility];  Service: Endoscopy;  Laterality: N/A;   COLONOSCOPY WITH PROPOFOL  N/A 12/24/2018   Pancolonic diverticulosis, normal TI, one 5 mm polyp in rectum (tubular adenoma). Somewhat friable hemorrhagic mucosa in area of IC valve/cecum, possibly related to scope trauma s/p biopsy. Repeat in 7 years.   DEBRIDEMENT OF ABDOMINAL WALL ABSCESS N/A 02/08/2013   Procedure: DEBRIDEMENT OF ABDOMINAL WALL ABSCESS;  Surgeon: Beau Bound, MD;  Location: [facility];  Service: General;  Laterality: N/A;   ESOPHAGOGASTRODUODENOSCOPY (EGD) WITH PROPOFOL  N/A 05/20/2013   Dr.Rourk- s/p prior gastric surgery with normal esophagus, residual gastric mucosa and patent efferent limb   ESOPHAGOGASTRODUODENOSCOPY (EGD) WITH PROPOFOL  N/A 02/03/2014   Dr. Riley Cheadle:  s/p hemigastrectomy with retained gastric contents. Residual gastric mucosa and  efferent limb appeared normal otherwise. Query gastroparesis.    ESOPHAGOGASTRODUODENOSCOPY (EGD) WITH PROPOFOL  N/A 04/26/2014   WUJ:[id number]'G ring/small HH/mild non-erosive gasrtitis/normal anastomosis   ESOPHAGOGASTRODUODENOSCOPY (EGD) WITH PROPOFOL  N/A 12/23/2014   Dr.Rourk- s/p prior hemigastrctomy, active oozing from anastomotic suture site, hemostasis achieved   ESOPHAGOGASTRODUODENOSCOPY (EGD) WITH PROPOFOL  N/A 05/23/2016   Dr. 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    ESOPHAGOGASTRODUODENOSCOPY (EGD) WITH PROPOFOL  N/A 05/11/2017   Procedure: ESOPHAGOGASTRODUODENOSCOPY (EGD) WITH PROPOFOL ;  Surgeon: Suzette Espy, MD;  [facility];  Service: Endoscopy;  Laterality: N/A;   ESOPHAGOGASTRODUODENOSCOPY (EGD) WITH PROPOFOL  N/A 06/07/2021   Procedure: ESOPHAGOGASTRODUODENOSCOPY (EGD) WITH PROPOFOL ;  Surgeon: Suzette Espy, MD;  [facility];  Service: Endoscopy;  Laterality: N/A;  11:45am   ESOPHAGOGASTRODUODENOSCOPY (EGD) WITH PROPOFOL  N/A 10/01/2021   Procedure: ESOPHAGOGASTRODUODENOSCOPY (EGD) WITH PROPOFOL ;  Surgeon: Vinetta Greening, DO;  [facility];  Service: Endoscopy;  Laterality: N/A;  3:00pm   EXCISION OF MESH  08/2019   HEMORRHOID SURGERY N/A 06/18/2017   Procedure: THREE COLUMN EXTENSIVE HEMORRHOIDECTOMY;  Surgeon: Awilda Bogus, MD;  Location: [facility];  Service: General;  Laterality: N/A;   HERNIA REPAIR     INCISION AND DRAINAGE ABSCESS N/A 01/06/2017   Procedure: INCISION AND DRAINAGE ABDOMINAL WALL ABSCESS;  Surgeon: Alanda Allegra, MD;  Location: [facility];  Service: General;  Laterality: N/A;   incisional hernial repair  09/05/2021   [place]   LEFT HEART CATH AND CORONARY ANGIOGRAPHY N/A 05/09/2020   Procedure: LEFT HEART CATH AND CORONARY ANGIOGRAPHY;  Surgeon: Cody Das, MD;  [facility];  Service: Cardiovascular;  Laterality: N/A;   POLYPECTOMY  12/24/2018   Procedure: POLYPECTOMY;  Surgeon: Suzette Espy, MD;  [facility];  Service: Endoscopy;;  colon   tendon repar Right    wrist   WOUND EXPLORATION Right 06/24/2013   Procedure: exploration of traumatic wound right wrist;  Surgeon: Milagros Alf, MD;  Location: [facility] OR;  Service: Orthopedics;  Laterality: Right;     Home Medications:  Prior to Admission medications   Medication Sig Start Date End Date Taking? Authorizing Provider  amLODipine  (NORVASC ) 10 MG tablet Take 10 mg by mouth daily. 05/18/21  Yes [provider]  aspirin  EC 81 MG EC tablet Take 1 tablet (81 mg total) by mouth daily. 05/19/19  Yes Elgergawy, Ardia Kraft, MD  Cholecalciferol  50 MCG (2000 UT) CAPS Take 1 tablet by mouth daily.   Yes [provider]  cyclobenzaprine  (FLEXERIL ) 5 MG tablet Take 1 tablet (5 mg total) by mouth 2 (two) times daily as needed for muscle spasms. 07/12/23  Yes Small, Brooke L, PA   DULoxetine  (CYMBALTA ) 30 MG capsule Take 30 mg by mouth 2 (two) times daily. 08/14/21  Yes [provider]  gabapentin  (NEURONTIN ) 100 MG capsule Take 100 mg by mouth 3 (three) times daily. 03/03/23  Yes [provider]  HYDROcodone -acetaminophen  (NORCO/VICODIN) 5-325 MG tablet Take one tab po q 4 hrs prn pain Patient taking differently: Take 1 tablet by mouth every 4 (four) hours as needed for moderate pain (pain score 4-6). Take one tab po q 4 hrs prn pain 07/30/23  Yes Triplett, Tammy, PA-C  hydroxychloroquine  (PLAQUENIL ) 200 MG tablet Take 1.5 tablets (300 mg total) by mouth daily. 05/05/23  Yes Rice, Haig Levan, MD  ibuprofen  (ADVIL ) 800 MG tablet Take 800 mg  by mouth 2 (two) times daily as needed for mild pain (pain score 1-3). 08/20/23  Yes [provider]  metoprolol  tartrate (LOPRESSOR ) 50 MG tablet Take 50 mg by mouth 2 (two) times daily. 01/06/20  Yes [provider]  mirtazapine  (REMERON ) 15 MG tablet Take 15 mg by mouth at bedtime as needed (for sleep). 08/10/21  Yes [provider]  nitroGLYCERIN  (NITROSTAT ) 0.4 MG SL tablet Place 1 tablet (0.4 mg total) under the tongue every 5 (five) minutes as needed for chest pain. 12/29/19 08/21/23 Yes Patwardhan, Kaye Parsons, MD  ondansetron  (ZOFRAN -ODT) 4 MG disintegrating tablet Take 4 mg by mouth every 4 (four) hours as needed for nausea or vomiting. 07/14/23  Yes [provider]  pantoprazole  (PROTONIX ) 40 MG tablet Take 1 tablet (40 mg total) by mouth daily before breakfast. 08/05/23  Yes Lanney Pitts, PA-C  Rimegepant Sulfate  (NURTEC) 75 MG TBDP Take 75 mg by mouth as needed. Patient taking differently: Take 75 mg by mouth as needed (migraines). 09/12/21  Yes Dala Dublin, MD  rosuvastatin  (CRESTOR ) 20 MG tablet Take 1 tablet (20 mg total) by mouth daily. 06/25/19 11/06/28 Yes Patwardhan, Manish J, MD  thiamine  (VITAMIN B1) 100 MG tablet Take 100 mg by mouth daily. 04/26/23  Yes [provider]    Scheduled Meds:  amLODipine   10 mg Oral Daily   aspirin  EC  81 mg Oral Daily   cholecalciferol   2,000 Units Oral Daily   DULoxetine   30 mg Oral BID   enoxaparin  (LOVENOX ) injection  40 mg Subcutaneous Q24H   gabapentin   100 mg Oral TID   hydroxychloroquine   300 mg Oral Daily   metoprolol  tartrate  50 mg Oral BID   pantoprazole   40 mg Oral QAC breakfast   rosuvastatin   20 mg Oral Daily   thiamine   100 mg Oral Daily   Continuous Infusions:  PRN Meds: acetaminophen  **OR** acetaminophen , cyclobenzaprine , HYDROcodone -acetaminophen , mirtazapine , nitroGLYCERIN , ondansetron  **OR** ondansetron  (ZOFRAN ) IV, mouth rinse  Allergies:    Allergies  Allergen Reactions   Metoclopramide  Anaphylaxis and Other (See Comments)    Reglan  EPS symptoms  Jerking movements   Codeine Hives    Social History:   Social History   Socioeconomic History   Marital status: Divorced    Spouse name: Not on file   Number of children: 4   Years of education: 10   Highest education level: Not on file  Occupational History   Not on file  Tobacco Use   Smoking status: Every Day    Current packs/day: 0.25    Average packs/day: 0.3 packs/day for 35.0 years (8.8 ttl pk-yrs)    Types: Cigarettes    Passive exposure: Past   Smokeless tobacco: Never  Vaping Use   Vaping status: Never Used  Substance and Sexual Activity   Alcohol use: Not Currently   Drug use: Not Currently    Types: Cocaine    Comment: no cocaine in4  years per patient (08/20/21)   Sexual activity: Not Currently    Birth control/protection: Surgical  Other Topics Concern   Not on file  Social History Narrative   Caffeine- tea occass   Social Drivers of Health   Financial Resource Strain: Low Risk  (03/15/2019)   Overall Financial Resource Strain (CARDIA)    Difficulty of Paying Living Expenses: Not very hard  Food Insecurity: No Food Insecurity (08/21/2023)   Hunger Vital Sign    Worried About Running Out of Food  in the Last Year:  Never true    Ran Out of Food in the Last Year: Never true  Transportation Needs: No Transportation Needs (08/21/2023)   PRAPARE - Administrator, Civil Service (Medical): No    Lack of Transportation (Non-Medical): No  Physical Activity: Inactive (03/15/2019)   Exercise Vital Sign    Days of Exercise per Week: 0 days    Minutes of Exercise per Session: 0 min  Stress: Stress Concern Present (03/15/2019)   [employer] of Occupational Health - Occupational Stress Questionnaire    Feeling of Stress : Rather much  Social Connections: Moderately Isolated (03/15/2019)   Social Connection and Isolation Panel [NHANES]    Frequency of Communication with Friends and Family: More than three times a week    Frequency of Social Gatherings with Friends and Family: More than three times a week    Attends Religious Services: 1 to 4 times per year    Active Member of [employer] or Organizations: No    Attends Banker Meetings: Never    Marital Status: Divorced  Catering manager Violence: Not At Risk (08/21/2023)   Humiliation, Afraid, Rape, and Kick questionnaire    Fear of Current or Ex-Partner: No    Emotionally Abused: No    Physically Abused: No    Sexually Abused: No    Family History:    Family History  Problem Relation Age of Onset   Drug abuse Mother    Lung cancer Father    Cancer Father        mets   Drug abuse Sister    Drug abuse Brother    Breast cancer Maternal Aunt    Bipolar disorder Maternal Aunt    Drug abuse Maternal Aunt    Colon cancer Maternal Grandmother        late 33s, early 47s   Bipolar disorder Paternal Grandfather    Brain cancer Son    Schizophrenia Son    Cancer Son        brain   Bipolar disorder Cousin    Liver disease Neg Hx      ROS:  Please see the history of present illness.   All other ROS reviewed and negative.     Physical Exam/Data: Vitals:   08/21/23 2300 08/21/23 2330 08/21/23 2345 08/22/23  0517  BP: 103/77 110/75 108/80 96/69  Pulse: 74 70 72 60  Resp: (!) 22 17 16 18   Temp: 98 F (36.7 C)  98.1 F (36.7 C) 98.3 F (36.8 C)  TempSrc: Oral  Oral Oral  SpO2: 93% 95% 100% 100%  Weight:   72.9 kg   Height:   5\' 3"  (1.6 m)    No intake or output data in the 24 hours ending 08/22/23 0744    08/21/2023   11:45 PM 08/21/2023    4:25 PM 08/05/2023    2:26 PM  Last 3 Weights  Weight (lbs) 160 lb 11.5 oz 163 lb 3.2 oz 163 lb 3.2 oz  Weight (kg) 72.9 kg 74.027 kg 74.027 kg     Body mass index is 28.47 kg/m.  General:  Well nourished, well developed female appearing in no acute distress HEENT: normal Neck: no JVD Vascular: No carotid bruits; Distal pulses 2+ bilaterally Cardiac:  normal S1, S2; RRR; no murmur  Lungs:  clear to auscultation bilaterally, no wheezing, rhonchi or rales  Abd: soft, nontender, no hepatomegaly  Ext: no pitting edema Musculoskeletal:  No deformities, BUE and BLE strength normal and equal  Skin: warm and dry  Neuro:  CNs 2-12 intact, no focal abnormalities noted Psych:  Normal affect   EKG:  The EKG was personally reviewed and demonstrates: NSR, HR 82 with no acute ST changes.   Telemetry:  Telemetry was personally reviewed and demonstrates: Normal sinus rhythm with episodes of sinus bradycardia, heart rate in 50's to 60's.  Relevant CV Studies:  Cardiac Catheterization: 04/2020 LM: Normal LAD: Prox 20% calcific disease        Diag 2 ostial 50%, prox 40% stenoses LCx: No significant disease RCA: Distal RCA focal 40% stenosis   TIMI III flow in all vessels in spite of IC NTG suggests possible microvascular dysfunction  Echocardiogram: 02/2023 IMPRESSIONS     1. Left ventricular ejection fraction, by estimation, is 60 to 65%. The  left ventricle has normal function. The left ventricle has no regional  wall motion abnormalities. Left ventricular diastolic parameters were  normal.   2. Right ventricular systolic function is normal. The  right ventricular  size is normal. There is normal pulmonary artery systolic pressure.   3. The mitral valve is abnormal. Trivial mitral valve regurgitation. No  evidence of mitral stenosis.   4. The aortic valve is tricuspid. Aortic valve regurgitation is not  visualized. No aortic stenosis is present.   5. Aortic dilatation noted. There is mild dilatation of the aortic root,  measuring 38 mm.   6. The inferior vena cava is normal in size with greater than 50%  respiratory variability, suggesting right atrial pressure of 3 mmHg.    Cardiac PET: 08/2023   Small reversible apical perfusion defect, suggesting small area of ischemia.  Myocardial blood flow reserve is reduced globally (1.8) and in each coronary distribution.  No high risk findings such as TID or drop in EF with stress.  Severe coronary calcifications on CT.  Differential diagnosis includes microvascular disease vs severe multivessel CAD.  Overall, study is intermediate risk   LV perfusion is abnormal. There is evidence of ischemia. Defect 1: There is a medium defect with mild reduction in uptake present in the apical anterior, lateral and apex location(s) that is reversible. There is normal wall motion in the defect area. Consistent with ischemia.   Rest left ventricular function is normal. Rest EF: 60%. Stress left ventricular function is normal. Stress EF: 73%. End diastolic cavity size is normal. End systolic cavity size is normal.   Myocardial blood flow was computed to be 1.70ml/g/min at rest and 1.52ml/g/min at stress. Global myocardial blood flow reserve was 1.82 and was abnormal.   Coronary calcium  was present on the attenuation correction CT images. Severe coronary calcifications were present. Coronary calcifications were present in the left anterior descending artery, left circumflex artery and right coronary artery distribution(s).   Findings are consistent with ischemia. The study is intermediate risk.   Electronically  signed by Carson Clara, MD  Laboratory Data: High Sensitivity Troponin:   Recent Labs  Lab 08/21/23 1700 08/21/23 1921  TROPONINIHS <2 <2     Chemistry Recent Labs  Lab 08/21/23 1700 08/22/23 0505  NA 137 142  K 3.7 3.2*  CL 111 109  CO2 22 24  GLUCOSE 74 85  BUN 7 8  CREATININE 0.46 0.41*  CALCIUM  8.9 8.6*  GFRNONAA >60 >60  ANIONGAP 4* 9    Recent Labs  Lab 08/21/23 1700  PROT 7.6  ALBUMIN 4.0  AST 25  ALT 29  ALKPHOS 87  BILITOT 0.7   Lipids No results for  input(s): "CHOL", "TRIG", "HDL", "LABVLDL", "LDLCALC", "CHOLHDL" in the last 168 hours.  Hematology Recent Labs  Lab 08/21/23 1700 08/22/23 0505  WBC 6.9 5.5  RBC 5.14* 4.59  HGB 13.8 11.8*  HCT 39.4 34.6*  MCV 76.7* 75.4*  MCH 26.8 25.7*  MCHC 35.0 34.1  RDW 16.8* 16.4*  PLT 302 276   Thyroid  No results for input(s): "TSH", "FREET4" in the last 168 hours.  BNP Recent Labs  Lab 08/21/23 1700  BNP 10.0    DDimer  Recent Labs  Lab 08/21/23 1700  DDIMER 0.44    Radiology/Studies:  DG Chest 2 View Result Date: 08/21/2023 CLINICAL DATA:  Chest pain. Left chest pain beginning last night. Nausea and vomiting. EXAM: CHEST - 2 VIEW COMPARISON:  07/12/2023 FINDINGS: Heart size and pulmonary vascularity are normal. Mild peribronchial thickening may indicate airways disease. No airspace disease or consolidation in the lungs. No pleural effusion or pneumothorax. Mediastinal contours appear intact. Calcification of the aorta. Surgical clips in the upper abdomen. IMPRESSION: Mild peribronchial thickening may indicate airways disease. No focal consolidation. Electronically Signed   By: Boyce Byes M.D.   On: 08/21/2023 17:46   Assessment and Plan:  1. Chest Pain with Atypical Features/Abnormal Stress Test - Her recent episodes of chest pain have atypical qualities as discussed above and suspect anxiety might be playing a role as symptoms worsened after she was called with her stress test  results. Says that she has been unable to sleep since then as well. We reviewed that she has ruled out for ACS and EKG is without acute ST changes.  Reviewed options with the patient in regards to arranging for cardiac catheterization as outpatient as initially planned vs. pursuing this admission and she favors inpatient evaluation given her symptoms which is reasonable. Will arrange for [facility] today with Dr. Filiberto Hug  as he is actually in the catheterization lab today. - Continue ASA 81 mg daily, Lopressor  50 mg twice daily and Crestor  20 mg daily. She has been on Amlodipine  as discussed above for antianginal benefit but given her soft BP, will hold for now. No indication for IV Heparin  at this time. She did consume breakfast (small amount of eggs and grits at 0745) and cath lab made aware. Will be NPO going forward.   2. CAD - Cardiac catheterization in 2022 showed mild to moderate nonobstructive disease as outlined above with slow flow in all vessels suggesting possible microvascular dysfunction. Recent cardiac PET as discussed above showed a small area of ischemia and Dr. Filiberto Hug recommended a cardiac catheterization for definitive evaluation. - Will arrange for a repeat cardiac catheterization this admission.  - Continue ASA 81 mg daily, Lopressor  50 mg twice daily and Crestor  20 mg daily.  3. HLD - LDL was at 99 when checked in 02/2023. She has been on Crestor  20 mg daily. Will recheck an FLP this admission. If LDL remains above goal, would titrate to 40 mg daily.  4. HTN - BP has been well-controlled and actually soft at times, at 96/69 on most recent check.  She has been continued on Lopressor  50 mg twice daily and Amlodipine  10 mg daily. Will hold Amlodipine  for now given current BP but anticipate resuming prior to discharge as she has been on this for antianginal benefit.  5. Hypokalemia - K+ was at 3.2 this morning. Will order supplementation.  6. Chronic Pain  Syndrome/Lupus/Schizoaffective Disorder - Remains on chronic pain medications and is also on Plaquenil  for SLE. Management of chronic conditions  per Hospitalist Team.   For questions or updates, please contact  [facility] Please consult [email or web address] for contact info under    Signed, Dorma Gash, PA-C  08/22/2023 7:44 AM  Attending Note  Patient seen and discussed with PA Finis Hugger, I agree with her documentation above. 58 yo female history of mild to mod CAD with possibly microvasular dysfunction from prior cath, HTN, SLE, schizoaffective disorder, chronic pain syndrome, presents with chest pain. Follow as outpatient by Dr Filiberto Hug, recent stress PET as outlined before, considertions for outpatient cath. Presents with recurrent chest pains. Midchest raditating down left arm, occur at rest or with activity lasting several minutes. Some increased DOE over the last few months.    K 3.7 BUN 7 WBC 6.9 Hgb 13.8 Plt 302 Ddimer 0.44 BNP 10  Trop neg x 2.  EKG SR, no ischemic changes CXR: mild peribronchial thickening  08/2023 stress PET: small area of apical ishcemia, reduced blood flow reserve globally, severe coronary calcifications by CT. Consider severe mulitivessel disease vs microvasc disease. Intermediate risk  02/2023 echo: LVE 60-65%, no WMAs  1.Chest pain/CAD - EKG and troponins are benign - recent abnormal stress PET as outlined above. Plans were for outpatient cath per her primary cardiologist Dr Filiberto Hug. Presents with ongoing chest pain symptoms. We will plan for cath today with Dr Filiberto Hug.  - continue home ASA, lopressor  50mg  bid, crestor  20mg  daily.    Armida Lander MD

## 2023-08-23 DIAGNOSIS — R072 Precordial pain: Secondary | ICD-10-CM | POA: Diagnosis not present

## 2023-08-23 DIAGNOSIS — R079 Chest pain, unspecified: Secondary | ICD-10-CM | POA: Diagnosis not present

## 2023-08-23 LAB — BASIC METABOLIC PANEL WITH GFR
Anion gap: 5 (ref 5–15)
BUN: 5 mg/dL — ABNORMAL LOW (ref 6–20)
CO2: 22 mmol/L (ref 22–32)
Calcium: 8.6 mg/dL — ABNORMAL LOW (ref 8.9–10.3)
Chloride: 109 mmol/L (ref 98–111)
Creatinine, Ser: 0.58 mg/dL (ref 0.44–1.00)
GFR, Estimated: >60 mL/min (ref >60)
Glucose, Bld: 102 mg/dL — ABNORMAL HIGH (ref 70–99)
Potassium: 3.9 mmol/L (ref 3.5–5.1)
Sodium: 136 mmol/L (ref 135–145)

## 2023-08-23 LAB — CBC
HCT: 34.4 % — ABNORMAL LOW (ref 36.0–46.0)
Hemoglobin: 11.9 g/dL — ABNORMAL LOW (ref 12.0–15.0)
MCH: 26 pg (ref 26.0–34.0)
MCHC: 34.6 g/dL (ref 30.0–36.0)
MCV: 75.1 fL — ABNORMAL LOW (ref 80.0–100.0)
Platelets: 272 10*3/uL (ref 150–400)
RBC: 4.58 MIL/uL (ref 3.87–5.11)
RDW: 16 % — ABNORMAL HIGH (ref 11.5–15.5)
WBC: 4.8 10*3/uL (ref 4.0–10.5)
nRBC: 0 % (ref 0.0–0.2)

## 2023-08-23 LAB — LIPID PANEL
Cholesterol: 74 mg/dL (ref 0–200)
HDL: 31 mg/dL — ABNORMAL LOW (ref >40)
LDL Cholesterol: 33 mg/dL (ref 0–99)
Total CHOL/HDL Ratio: 2.4 ratio
Triglycerides: 51 mg/dL (ref <150)
VLDL: 10 mg/dL (ref 0–40)

## 2023-08-23 NOTE — Progress Notes (Signed)
 PROGRESS NOTE    Angel Price  EAV:[id number] DOB: 1965-06-21 DOA: 08/21/2023 PCP: Wyvonna Heidelberg, MD  Brief Narrative:  This 58 yrs old female with PMH significant for alcohol use, anxiety, anemia, GERD, history of stroke, lupus, hypertension and sickle cell trait who presented in the ED with chest pressure and palpitations.  She has been at her usual state of health until 24 hrs prior to admission, when she noted episodic palpitations, not exertion related, moderate to severe in intensity, associated with chest pressure and dyspnea but not diaphoresis. This has been episodic with no improving or worsening factors. Denies any frank angina, PND, orthopnea or lower extremity edema.    Patient follows with Dr Filiberto Hug from cardiology for atypical chest pain. Her stress test did show abnormalities suggestive of significant coronary artery disease. Pt was evaluated by inpatient cardiology team at [facility] and Patient was transferred to [facility] for left heart cath.  Assessment & Plan:   Principal Problem:   Chest pain Active Problems:   Essential hypertension   SLE (systemic lupus erythematosus) (HCC)   Dyslipidemia   History of schizoaffective disorder  Chest pain: Patient presented with intermittent chest pain,  chest pressure and palpitations. Patient was evaluated by cardiology team at [facility] and transferred to [facility] for left heart cath.  Left heart cath shows mild nonobstructive coronary artery disease.  Cardiology recommended medical management Continue telemetry.  Dyslipidemia: Continue with statin therapy    Hypokalemia: - oral replacement given    Microcytic anemia: - follow up anemia panel (pending)   Essential hypertension: Continue blood pressure control with metoprolol  50 mg BID    SLE (systemic lupus erythematosus) No signs of acute flare.  Continue with hydroxychloroquine .   History of schizoaffective disorder Continue mirtazapine , duloxetine , and  gabapentin .  As needed hydrocodone  and acetaminophen .   DVT prophylaxis: Lovenox  Code Status: Full code Family Communication: No family at bed side. Disposition Plan:   Status is: Observation The patient remains OBS appropriate and will d/c before 2 midnights.    Consultants:  Cardiology  Procedures: [facility]  Antimicrobials: Anti-infectives (From admission, onward)    Start     Dose/Rate Route Frequency Ordered Stop   08/22/23 1000  hydroxychloroquine  (PLAQUENIL ) tablet 300 mg        300 mg Oral Daily 08/21/23 2346        Subjective: Patient seen and examined at bedside.Overnight events noted. Patient reports feeling better,  denies any more chest pain.  States she has left heart cath yesterday,  awaiting cardiology follow-up.  Objective: Vitals:   08/22/23 2343 08/23/23 0310 08/23/23 0910 08/23/23 0924  BP: 118/72 103/61 (!) 92/58 111/77  Pulse: (!) 51 (!) 52 (!) 54   Resp: 20 16 14 18   Temp: 97.9 F (36.6 C) 98 F (36.7 C) 98.1 F (36.7 C)   TempSrc: Oral Oral Oral   SpO2: 96% 96% 96%   Weight:      Height:        Intake/Output Summary (Last 24 hours) at 08/23/2023 1124 Last data filed at 08/22/2023 1957 Gross per 24 hour  Intake 240 ml  Output --  Net 240 ml   Filed Weights   08/21/23 1625 08/21/23 2345  Weight: 74 kg 72.9 kg    Examination:  General exam: Appears calm and comfortable, not in any acute distress. Respiratory system: CTA Bilaterally . Respiratory effort normal.  RR 15 Cardiovascular system: S1 & S2 heard, RRR. No JVD, murmurs, rubs, gallops or clicks.  No pedal edema. Gastrointestinal system: Abdomen is non distended, soft and non tender.  Normal bowel sounds heard. Central nervous system: Alert and oriented x 3. No focal neurological deficits. Extremities: Symmetric 5 x 5 power. Skin: No rashes, lesions or ulcers Psychiatry: Judgement and insight appear normal. Mood & affect appropriate.     Data Reviewed: I have personally reviewed  following labs and imaging studies  CBC: Recent Labs  Lab 08/21/23 1700 08/22/23 0505 08/23/23 0300  WBC 6.9 5.5 4.8  NEUTROABS 3.3  --   --   HGB 13.8 11.8* 11.9*  HCT 39.4 34.6* 34.4*  MCV 76.7* 75.4* 75.1*  PLT 302 276 272   Basic Metabolic Panel: Recent Labs  Lab 08/21/23 1700 08/22/23 0505 08/23/23 0300  NA 137 142 136  K 3.7 3.2* 3.9  CL 111 109 109  CO2 22 24 22   GLUCOSE 74 85 102*  BUN 7 8 5*  CREATININE 0.46 0.41* 0.58  CALCIUM  8.9 8.6* 8.6*  MG  --  2.0  --    GFR: Estimated Creatinine Clearance: 76 mL/min (by C-G formula based on SCr of 0.58 mg/dL). Liver Function Tests: Recent Labs  Lab 08/21/23 1700  AST 25  ALT 29  ALKPHOS 87  BILITOT 0.7  PROT 7.6  ALBUMIN 4.0   Recent Labs  Lab 08/21/23 1700  LIPASE 24   No results for input(s): "AMMONIA " in the last 168 hours. Coagulation Profile: No results for input(s): "INR", "PROTIME" in the last 168 hours. Cardiac Enzymes: No results for input(s): "CKTOTAL", "CKMB", "CKMBINDEX", "TROPONINI" in the last 168 hours. BNP (last 3 results) No results for input(s): "PROBNP" in the last 8760 hours. HbA1C: No results for input(s): "HGBA1C" in the last 72 hours. CBG: No results for input(s): "GLUCAP" in the last 168 hours. Lipid Profile: Recent Labs    08/23/23 0300  CHOL 74  HDL 31*  LDLCALC 33  TRIG 51  CHOLHDL 2.4   Thyroid  Function Tests: No results for input(s): "TSH", "T4TOTAL", "FREET4", "T3FREE", "THYROIDAB" in the last 72 hours. Anemia Panel: Recent Labs    08/22/23 1030 08/22/23 1038  VITAMINB12  --  126*  FOLATE  --  19.6  FERRITIN  --  9*  TIBC  --  439  IRON  --  53  RETICCTPCT 1.7  --    Sepsis Labs: No results for input(s): "PROCALCITON", "LATICACIDVEN" in the last 168 hours.  No results found for this or any previous visit (from the past 240 hours).   Radiology Studies: CARDIAC CATHETERIZATION Result Date: 08/22/2023   Prox LAD to Mid LAD lesion is 20% stenosed.    Dist RCA lesion is 20% stenosed.   Prox RCA lesion is 30% stenosed. Mild non-obstructive CAD Normal LV systolic function LVEDP 21 mmHg Recommendations: Medical management of mild CAD.   DG Chest 2 View Result Date: 08/21/2023 CLINICAL DATA:  Chest pain. Left chest pain beginning last night. Nausea and vomiting. EXAM: CHEST - 2 VIEW COMPARISON:  07/12/2023 FINDINGS: Heart size and pulmonary vascularity are normal. Mild peribronchial thickening may indicate airways disease. No airspace disease or consolidation in the lungs. No pleural effusion or pneumothorax. Mediastinal contours appear intact. Calcification of the aorta. Surgical clips in the upper abdomen. IMPRESSION: Mild peribronchial thickening may indicate airways disease. No focal consolidation. Electronically Signed   By: Boyce Byes M.D.   On: 08/21/2023 17:46   Scheduled Meds:  aspirin  EC  81 mg Oral Daily   cholecalciferol   2,000 Units Oral Daily  DULoxetine   30 mg Oral BID   enoxaparin  (LOVENOX ) injection  40 mg Subcutaneous Q24H   gabapentin   100 mg Oral TID   hydroxychloroquine   300 mg Oral Daily   metoprolol  tartrate  50 mg Oral BID   pantoprazole   40 mg Oral QAC breakfast   rosuvastatin   20 mg Oral Daily   sodium chloride  flush  3 mL Intravenous Q12H   thiamine   100 mg Oral Daily   Continuous Infusions:  sodium chloride        LOS: 0 days    Time spent: 50 mins    Magdalene School, MD [facility]   If 7PM-7AM, please contact night-coverage

## 2023-08-23 NOTE — Plan of Care (Signed)
  Problem: Education: Goal: Knowledge of General Education information will improve Description: Including pain rating scale, medication(s)/side effects and non-pharmacologic comfort measures Outcome: Completed/Met   Problem: Health Behavior/Discharge Planning: Goal: Ability to manage health-related needs will improve Outcome: Completed/Met   Problem: Clinical Measurements: Goal: Ability to maintain clinical measurements within normal limits will improve Outcome: Completed/Met Goal: Will remain free from infection Outcome: Completed/Met Goal: Diagnostic test results will improve Outcome: Completed/Met Goal: Respiratory complications will improve Outcome: Completed/Met Goal: Cardiovascular complication will be avoided Outcome: Completed/Met   Problem: Activity: Goal: Risk for activity intolerance will decrease Outcome: Completed/Met   Problem: Nutrition: Goal: Adequate nutrition will be maintained Outcome: Completed/Met   Problem: Coping: Goal: Level of anxiety will decrease Outcome: Completed/Met   Problem: Elimination: Goal: Will not experience complications related to bowel motility Outcome: Completed/Met Goal: Will not experience complications related to urinary retention Outcome: Completed/Met   Problem: Pain Managment: Goal: General experience of comfort will improve and/or be controlled Outcome: Completed/Met   Problem: Education: Goal: Understanding of CV disease, CV risk reduction, and recovery process will improve Outcome: Completed/Met   Problem: Activity: Goal: Ability to return to baseline activity level will improve Outcome: Completed/Met   Problem: Cardiovascular: Goal: Ability to achieve and maintain adequate cardiovascular perfusion will improve Outcome: Completed/Met Goal: Vascular access site(s) Level 0-1 will be maintained Outcome: Completed/Met   Problem: Health Behavior/Discharge Planning: Goal: Ability to safely manage health-related  needs after discharge will improve Outcome: Completed/Met

## 2023-08-23 NOTE — Discharge Instructions (Signed)
 Advised to follow-up with primary care physician in 1 week. Patient had a left heart cath which shows nonobstructive coronary artery disease. Advised to take aspirin  Crestor  and metoprolol .

## 2023-08-23 NOTE — Discharge Summary (Signed)
 Physician Discharge Summary  Angel Price QMV:[id number] DOB: 27-Jul-1965 DOA: 08/21/2023  PCP: Fanta, Tesfaye Demissie, MD  Admit date: 08/21/2023  Discharge date: 08/23/2023  Admitted From: Home  Disposition:  Home  Recommendations for Outpatient Follow-up:  Follow up with PCP in 1-2 weeks. Please obtain BMP/CBC in one week. Patient had left heart cath which shows nonobstructive coronary artery disease. Advised to take aspirin , Crestor  and metoprolol .  Home Health: None Equipment/Devices: None  Discharge Condition: Stable CODE STATUS:Full code Diet recommendation: Heart Healthy   Brief [facility] Course: This 58 yrs old female with PMH significant for alcohol use, anxiety, anemia, GERD, history of stroke, lupus, hypertension and sickle cell trait who presented in the ED with chest pressure and palpitations.  She has been at her usual state of health until 24 hrs prior to admission, when she noted episodic palpitations, not exertion related, moderate to severe in intensity, associated with chest pressure and dyspnea but not diaphoresis. This has been episodic with no improving or worsening factors. Denies any frank angina, PND, orthopnea or lower extremity edema.  Patient follows with Dr Filiberto Hug from cardiology for atypical chest pain. Her stress test did show abnormalities suggestive of significant coronary artery disease. Pt was evaluated by inpatient cardiology team at [facility] and Patient was transferred to [facility] for left heart cath.  Patient underwent left heart cath, and  found to have nonobstructive coronary artery disease. Cardiology recommended medical management. Patient can be discharged home.  Patient states that she wants to be discharged home.   Discharge Diagnoses:  Principal Problem:   Chest pain Active Problems:   Essential hypertension   SLE (systemic lupus erythematosus) (HCC)   Dyslipidemia   History of schizoaffective disorder    Discharge  Instructions:  Discharge Instructions     Call MD for:  difficulty breathing, headache or visual disturbances   Complete by: As directed    Call MD for:  persistant dizziness or light-headedness   Complete by: As directed    Call MD for:  persistant nausea and vomiting   Complete by: As directed    Diet - low sodium heart healthy   Complete by: As directed    Diet Carb Modified   Complete by: As directed    Discharge instructions   Complete by: As directed    Advised to follow-up with primary care physician in 1 week. Patient had a left heart cath which shows nonobstructive coronary artery disease. Advised to take aspirin  Crestor  and metoprolol .   Increase activity slowly   Complete by: As directed       Allergies as of 08/23/2023       Reactions   Metoclopramide  Anaphylaxis, Other (See Comments)   Reglan  EPS symptoms  Jerking movements   Codeine Hives        Medication List     TAKE these medications    amLODipine  10 MG tablet Commonly known as: NORVASC  Take 10 mg by mouth daily.   aspirin  EC 81 MG tablet Take 1 tablet (81 mg total) by mouth daily.   Cholecalciferol  50 MCG (2000 UT) Caps Take 1 tablet by mouth daily.   cyclobenzaprine  5 MG tablet Commonly known as: FLEXERIL  Take 1 tablet (5 mg total) by mouth 2 (two) times daily as needed for muscle spasms.   DULoxetine  30 MG capsule Commonly known as: CYMBALTA  Take 30 mg by mouth 2 (two) times daily.   gabapentin  100 MG capsule Commonly known as: NEURONTIN  Take 100 mg by mouth  3 (three) times daily.   HYDROcodone -acetaminophen  5-325 MG tablet Commonly known as: NORCO/VICODIN Take one tab po q 4 hrs prn pain What changed:  how much to take how to take this when to take this reasons to take this   hydroxychloroquine  200 MG tablet Commonly known as: PLAQUENIL  Take 1.5 tablets (300 mg total) by mouth daily.   ibuprofen  800 MG tablet Commonly known as: ADVIL  Take 800 mg by mouth 2 (two) times  daily as needed for mild pain (pain score 1-3).   metoprolol  tartrate 50 MG tablet Commonly known as: LOPRESSOR  Take 50 mg by mouth 2 (two) times daily.   mirtazapine  15 MG tablet Commonly known as: REMERON  Take 15 mg by mouth at bedtime as needed (for sleep).   nitroGLYCERIN  0.4 MG SL tablet Commonly known as: NITROSTAT  Place 1 tablet (0.4 mg total) under the tongue every 5 (five) minutes as needed for chest pain.   Nurtec 75 MG Tbdp Generic drug: Rimegepant Sulfate  Take 75 mg by mouth as needed. What changed: reasons to take this   ondansetron  4 MG disintegrating tablet Commonly known as: ZOFRAN -ODT Take 4 mg by mouth every 4 (four) hours as needed for nausea or vomiting.   pantoprazole  40 MG tablet Commonly known as: PROTONIX  Take 1 tablet (40 mg total) by mouth daily before breakfast.   rosuvastatin  20 MG tablet Commonly known as: CRESTOR  Take 1 tablet (20 mg total) by mouth daily.   thiamine  100 MG tablet Commonly known as: VITAMIN B1 Take 100 mg by mouth daily.        Follow-up Information     Fanta, Nancee Awe, MD Follow up in 1 week(s).   Specialty: Internal Medicine Contact information: [address] [place] [address] [phone]                Allergies  Allergen Reactions   Metoclopramide  Anaphylaxis and Other (See Comments)    Reglan  EPS symptoms  Jerking movements   Codeine Hives    Consultations: Cardiology   Procedures/Studies: CARDIAC CATHETERIZATION Result Date: 08/22/2023   Prox LAD to Mid LAD lesion is 20% stenosed.   Dist RCA lesion is 20% stenosed.   Prox RCA lesion is 30% stenosed. Mild non-obstructive CAD Normal LV systolic function LVEDP 21 mmHg Recommendations: Medical management of mild CAD.   DG Chest 2 View Result Date: 08/21/2023 CLINICAL DATA:  Chest pain. Left chest pain beginning last night. Nausea and vomiting. EXAM: CHEST - 2 VIEW COMPARISON:  07/12/2023 FINDINGS: Heart size and pulmonary  vascularity are normal. Mild peribronchial thickening may indicate airways disease. No airspace disease or consolidation in the lungs. No pleural effusion or pneumothorax. Mediastinal contours appear intact. Calcification of the aorta. Surgical clips in the upper abdomen. IMPRESSION: Mild peribronchial thickening may indicate airways disease. No focal consolidation. Electronically Signed   By: Boyce Byes M.D.   On: 08/21/2023 17:46   NM PET CT CARDIAC PERFUSION MULTI W/ABSOLUTE BLOODFLOW Result Date: 08/20/2023   Small reversible apical perfusion defect, suggesting small area of ischemia.  Myocardial blood flow reserve is reduced globally (1.8) and in each coronary distribution.  No high risk findings such as TID or drop in EF with stress.  Severe coronary calcifications on CT.  Differential diagnosis includes microvascular disease vs severe multivessel CAD.  Overall, study is intermediate risk   LV perfusion is abnormal. There is evidence of ischemia. Defect 1: There is a medium defect with mild reduction in uptake present in the apical anterior,  lateral and apex location(s) that is reversible. There is normal wall motion in the defect area. Consistent with ischemia.   Rest left ventricular function is normal. Rest EF: 60%. Stress left ventricular function is normal. Stress EF: 73%. End diastolic cavity size is normal. End systolic cavity size is normal.   Myocardial blood flow was computed to be 1.34ml/g/min at rest and 1.19ml/g/min at stress. Global myocardial blood flow reserve was 1.82 and was abnormal.   Coronary calcium  was present on the attenuation correction CT images. Severe coronary calcifications were present. Coronary calcifications were present in the left anterior descending artery, left circumflex artery and right coronary artery distribution(s).   Findings are consistent with ischemia. The study is intermediate risk.   Electronically signed by Carson Clara, MD EXAM: OVER-READ  INTERPRETATION  CT CHEST The following report is a limited chest CT over-read performed by radiologist Dr. Marcos Sevin [facility] on 08/19/2023. This over-read does not include interpretation of cardiac or coronary anatomy or pathology nor does it include evaluation of the PET data. The cardiac PET-CT interpretation by the cardiologist is attached. COMPARISON:  Chest CTA 07/12/2023 FINDINGS: Mediastinum/Nodes: No enlarged lymph nodes within the visualized mediastinum.Mild cardiomegaly, aortic and coronary artery atherosclerosis noted. Lungs/Pleura: There is no pleural effusion. Stable linear scarring inferiorly in the right upper lobe and calcified right middle lobe granuloma. Upper abdomen: Postsurgical changes in the upper abdomen from previous right adrenalectomy and gastric bypass procedure. Unchanged 2.1 cm low-density left adrenal adenoma for which no specific follow-up imaging is recommended. Musculoskeletal/Chest wall: No chest wall mass or suspicious osseous findings within the visualized chest. IMPRESSION: 1. No acute extra cardiac findings. 2. Stable left adrenal adenoma for which no specific follow-up imaging is recommended. 3.  Aortic Atherosclerosis (ICD10-I70.0). Electronically Signed   By: Elmon Hagedorn M.D.   On: 08/19/2023 13:43  DG ESOPHAGUS W DOUBLE CM (HD) Result Date: 08/13/2023 CLINICAL DATA:  58 year old with history of gastroparesis, Bilroth II, hernia repair complicated by infection now resolved, and esophageal stricture s/p dilation most recently in 2023 presents with complaint of globus sensation EXAM: ESOPHAGUS/BARIUM SWALLOW/TABLET STUDY TECHNIQUE: Combined double and single contrast examination was performed using effervescent crystals, high-density barium, and thin liquid barium. This exam was performed by Quintin Buckle PA-C, and was supervised and interpreted by Creasie Doctor, MD. FLUOROSCOPY: Radiation Exposure Index (as provided by the fluoroscopic device):  26.6 mGy Kerma COMPARISON:  None Available. FINDINGS: Swallowing: Appears normal. No vestibular penetration or aspiration seen. Pharynx: Unremarkable. Esophagus: Normal appearance. Esophageal motility: Within normal limits. Hiatal Hernia: None. Gastroesophageal reflux: None visualized. Ingested 13mm barium tablet: Passed normally Other: Multiple surgical clips in the area of the supposed gastric remnant. IMPRESSION: Unremarkable upper GI series in the setting of prior surgery as described. Electronically Signed   By: Creasie Doctor M.D.   On: 08/13/2023 16:38   DG HIP UNILAT WITH PELVIS 2-3 VIEWS RIGHT Result Date: 08/02/2023 CLINICAL DATA:  Right hip pain for months.  No known injury. EXAM: DG HIP (WITH OR WITHOUT PELVIS) 2-3V RIGHT COMPARISON:  Bilateral hip radiographs 03/06/2022 FINDINGS: Normal bone mineralization. Joint spaces are preserved. No acute fracture is seen. No dislocation. Vascular phleboliths overlie the pelvis. IMPRESSION: Normal right hip radiographs. Electronically Signed   By: Bertina Broccoli M.D.   On: 08/02/2023 22:52    Subjective: Patient was seen and examined at bedside.  Overnight events noted. Patient reports doing much better.  Denies any chest pain and wants to be discharged home.  Discharge Exam: Vitals:   08/23/23 0910 08/23/23 0924  BP: (!) 92/58 111/77  Pulse: (!) 54   Resp: 14 18  Temp: 98.1 F (36.7 C)   SpO2: 96%    Vitals:   08/22/23 2343 08/23/23 0310 08/23/23 0910 08/23/23 0924  BP: 118/72 103/61 (!) 92/58 111/77  Pulse: (!) 51 (!) 52 (!) 54   Resp: 20 16 14 18   Temp: 97.9 F (36.6 C) 98 F (36.7 C) 98.1 F (36.7 C)   TempSrc: Oral Oral Oral   SpO2: 96% 96% 96%   Weight:      Height:        General: Pt is alert, awake, not in acute distress Cardiovascular: RRR, S1/S2 +, no rubs, no gallops Respiratory: CTA bilaterally, no wheezing, no rhonchi Abdominal: Soft, NT, ND, bowel sounds + Extremities: no edema, no cyanosis    The results of  significant diagnostics from this hospitalization (including imaging, microbiology, ancillary and laboratory) are listed below for reference.     Microbiology: No results found for this or any previous visit (from the past 240 hours).   Labs: BNP (last 3 results) Recent Labs    08/21/23 1700  BNP 10.0   Basic Metabolic Panel: Recent Labs  Lab 08/21/23 1700 08/22/23 0505 08/23/23 0300  NA 137 142 136  K 3.7 3.2* 3.9  CL 111 109 109  CO2 22 24 22   GLUCOSE 74 85 102*  BUN 7 8 5*  CREATININE 0.46 0.41* 0.58  CALCIUM  8.9 8.6* 8.6*  MG  --  2.0  --    Liver Function Tests: Recent Labs  Lab 08/21/23 1700  AST 25  ALT 29  ALKPHOS 87  BILITOT 0.7  PROT 7.6  ALBUMIN 4.0   Recent Labs  Lab 08/21/23 1700  LIPASE 24   No results for input(s): "AMMONIA " in the last 168 hours. CBC: Recent Labs  Lab 08/21/23 1700 08/22/23 0505 08/23/23 0300  WBC 6.9 5.5 4.8  NEUTROABS 3.3  --   --   HGB 13.8 11.8* 11.9*  HCT 39.4 34.6* 34.4*  MCV 76.7* 75.4* 75.1*  PLT 302 276 272   Cardiac Enzymes: No results for input(s): "CKTOTAL", "CKMB", "CKMBINDEX", "TROPONINI" in the last 168 hours. BNP: Invalid input(s): "POCBNP" CBG: No results for input(s): "GLUCAP" in the last 168 hours. D-Dimer Recent Labs    08/21/23 1700  DDIMER 0.44   Hgb A1c No results for input(s): "HGBA1C" in the last 72 hours. Lipid Profile Recent Labs    08/23/23 0300  CHOL 74  HDL 31*  LDLCALC 33  TRIG 51  CHOLHDL 2.4   Thyroid  function studies No results for input(s): "TSH", "T4TOTAL", "T3FREE", "THYROIDAB" in the last 72 hours.  Invalid input(s): "FREET3" Anemia work up Recent Labs    08/22/23 1030 08/22/23 1038  VITAMINB12  --  126*  FOLATE  --  19.6  FERRITIN  --  9*  TIBC  --  439  IRON  --  53  RETICCTPCT 1.7  --    Urinalysis    Component Value Date/Time   COLORURINE YELLOW 07/30/2023 1334   APPEARANCEUR CLEAR 07/30/2023 1334   LABSPEC 1.017 07/30/2023 1334   PHURINE  7.0 07/30/2023 1334   GLUCOSEU NEGATIVE 07/30/2023 1334   HGBUR NEGATIVE 07/30/2023 1334   BILIRUBINUR NEGATIVE 07/30/2023 1334   KETONESUR NEGATIVE 07/30/2023 1334   PROTEINUR NEGATIVE 07/30/2023 1334   UROBILINOGEN 0.2 11/26/2014 0915   NITRITE NEGATIVE 07/30/2023 1334   LEUKOCYTESUR NEGATIVE 07/30/2023 1334  Sepsis Labs Recent Labs  Lab 08/21/23 1700 08/22/23 0505 08/23/23 0300  WBC 6.9 5.5 4.8   Microbiology No results found for this or any previous visit (from the past 240 hours).   Time coordinating discharge: Over 30 minutes  SIGNED:   Magdalene School, MD  [facility] 08/23/2023, 2:18 PM Pager   If 7PM-7AM, please contact night-coverage

## 2023-08-23 NOTE — Care Management Obs Status (Signed)
 MEDICARE OBSERVATION STATUS NOTIFICATION   Patient Details  Name: Angel Price MRN: [id number] Date of Birth: 1965/03/21   Medicare Observation Status Notification Given:  Yes    Talan Gildner G., RN 08/23/2023, 9:04 AM

## 2023-08-23 NOTE — Progress Notes (Signed)
 Cardiologist:  Pat[place]in  Subjective:  Denies SSCP, palpitations or Dyspnea   Objective:  Vitals:   08/22/23 2343 08/23/23 0310 08/23/23 0910 08/23/23 0924  BP: 118/72 103/61 (!) 92/58 111/77  Pulse: (!) 51 (!) 52 (!) 54   Resp: 20 16 14 18   Temp: 97.9 F (36.6 C) 98 F (36.7 C) 98.1 F (36.7 C)   TempSrc: Oral Oral Oral   SpO2: 96% 96% 96%   Weight:      Height:        Intake/Output from previous day:  Intake/Output Summary (Last 24 hours) at 08/23/2023 1152 Last data filed at 08/22/2023 1957 Gross per 24 hour  Intake 240 ml  Output --  Net 240 ml    Physical Exam:  Black female no distress Lungs clear No murmur Right radial cath site A   Lab Results: Basic Metabolic Panel: Recent Labs    08/22/23 0505 08/23/23 0300  NA 142 136  K 3.2* 3.9  CL 109 109  CO2 24 22  GLUCOSE 85 102*  BUN 8 5*  CREATININE 0.41* 0.58  CALCIUM  8.6* 8.6*  MG 2.0  --    Liver Function Tests: Recent Labs    08/21/23 1700  AST 25  ALT 29  ALKPHOS 87  BILITOT 0.7  PROT 7.6  ALBUMIN 4.0   Recent Labs    08/21/23 1700  LIPASE 24   CBC: Recent Labs    08/21/23 1700 08/22/23 0505 08/23/23 0300  WBC 6.9 5.5 4.8  NEUTROABS 3.3  --   --   HGB 13.8 11.8* 11.9*  HCT 39.4 34.6* 34.4*  MCV 76.7* 75.4* 75.1*  PLT 302 276 272   Cardiac Enzymes: No results for input(s): "CKTOTAL", "CKMB", "CKMBINDEX", "TROPONINI" in the last 72 hours. BNP: Invalid input(s): "POCBNP" D-Dimer: Recent Labs    08/21/23 1700  DDIMER 0.44   Hemoglobin A1C: No results for input(s): "HGBA1C" in the last 72 hours. Fasting Lipid Panel: Recent Labs    08/23/23 0300  CHOL 74  HDL 31*  LDLCALC 33  TRIG 51  CHOLHDL 2.4   Thyroid  Function Tests: No results for input(s): "TSH", "T4TOTAL", "T3FREE", "THYROIDAB" in the last 72 hours.  Invalid input(s): "FREET3" Anemia Panel: Recent Labs    08/22/23 1030 08/22/23 1038  VITAMINB12  --  126*  FOLATE  --  19.6  FERRITIN  --  9*   TIBC  --  439  IRON  --  53  RETICCTPCT 1.7  --     Imaging: CARDIAC CATHETERIZATION Result Date: 08/22/2023   Prox LAD to Mid LAD lesion is 20% stenosed.   Dist RCA lesion is 20% stenosed.   Prox RCA lesion is 30% stenosed. Mild non-obstructive CAD Normal LV systolic function LVEDP 21 mmHg Recommendations: Medical management of mild CAD.   DG Chest 2 View Result Date: 08/21/2023 CLINICAL DATA:  Chest pain. Left chest pain beginning last night. Nausea and vomiting. EXAM: CHEST - 2 VIEW COMPARISON:  07/12/2023 FINDINGS: Heart size and pulmonary vascularity are normal. Mild peribronchial thickening may indicate airways disease. No airspace disease or consolidation in the lungs. No pleural effusion or pneumothorax. Mediastinal contours appear intact. Calcification of the aorta. Surgical clips in the upper abdomen. IMPRESSION: Mild peribronchial thickening may indicate airways disease. No focal consolidation. Electronically Signed   By: Boyce Byes M.D.   On: 08/21/2023 17:46    Cardiac Studies:  ECG: SR no acute changes   Telemetry:  NSR   Echo: 03/05/23 EF  60-65%   Medications:    aspirin  EC  81 mg Oral Daily   cholecalciferol   2,000 Units Oral Daily   DULoxetine   30 mg Oral BID   enoxaparin  (LOVENOX ) injection  40 mg Subcutaneous Q24H   gabapentin   100 mg Oral TID   hydroxychloroquine   300 mg Oral Daily   metoprolol  tartrate  50 mg Oral BID   pantoprazole   40 mg Oral QAC breakfast   rosuvastatin   20 mg Oral Daily   sodium chloride  flush  3 mL Intravenous Q12H   thiamine   100 mg Oral Daily      sodium chloride       Assessment/Plan:   Chest Pain : atypical cath yesterday with no obstructive CAD continue ASA, statin and beta blocker Ok to d/c home Non cardiac chest paon   Janelle Mediate 08/23/2023, 11:52 AM

## 2023-08-25 ENCOUNTER — Encounter (HOSPITAL_COMMUNITY): Payer: Self-pay | Admitting: Cardiovascular Disease

## 2023-09-01 ENCOUNTER — Ambulatory Visit: Attending: Nurse Practitioner | Admitting: Nurse Practitioner

## 2023-09-01 NOTE — Progress Notes (Deleted)
 Office Visit    Patient Name: Angel Price Date of Encounter: 09/01/2023  Primary Care Provider:  Fanta, Tesfaye Demissie, MD Primary Cardiologist:  Cody Das, MD  Chief Complaint    58 year old female with a history of nonobstructive CAD, hypertension, hyperlipidemia, prior tobacco use, and prior alcohol dependence who presents for follow-up related to CAD.  Past Medical History    Past Medical History:  Diagnosis Date   Abscess    soft tissue   Adrenal mass (HCC)    Alcohol abuse    Anemia    Anxiety    Blood transfusion without reported diagnosis    Chronic abdominal pain    Chronic wound infection of abdomen    Colon polyp    colonoscopy 04/2014   Depression    Discoid lupus erythematosus    Diverticulosis    colonoscopy 04/2014 moderat pan colonic   Drug-seeking behavior    Gastritis    EGD 05/2014   Gastroparesis 01/2014   GERD (gastroesophageal reflux disease)    Hemorrhoid    internal large   Hiatal hernia    History of Billroth II operation    Hypertension    Lung nodule    CT 02/2014 needs repeat 1 month   Lung nodule < 6cm on CT 04/25/2014   Lupus    Malingering    Migraine    Nausea and vomiting    chronic, recurrent   Pancreatitis    Schatzki's ring    patent per EGD 04/2014   Sickle cell trait (HCC)    Stroke (HCC)    Suicide attempt (HCC)    Thyroid  disease 2000   overactive, radiation   Past Surgical History:  Procedure Laterality Date   ABDOMINAL HYSTERECTOMY  2013   [place]   ABDOMINAL HYSTERECTOMY     ABDOMINAL SURGERY     ADRENALECTOMY Right    AGILE CAPSULE N/A 01/05/2015   Procedure: AGILE CAPSULE;  Surgeon: Suzette Espy, MD;  [facility]: [facility];  Service: Endoscopy;  Laterality: N/A;  0700   BALLOON DILATION N/A 10/01/2021   Procedure: BALLOON DILATION;  Surgeon: Vinetta Greening, DO;  Location: [facility];  Service: Endoscopy;  Laterality: N/A;   Billroth II procedure      [place], first 2000,  2005/2006.   bilroth 2     BIOPSY  05/20/2013   Procedure: BIOPSIES OF ASCENDING AND SIGMOID COLON;  Surgeon: Suzette Espy, MD;  Location: [facility];  Service: Endoscopy;;   BIOPSY  04/26/2014   Procedure: BIOPSIES;  Surgeon: Alyce Jubilee, MD;  Location: [facility];  Service: Endoscopy;;   BIOPSY  12/24/2018   Procedure: BIOPSY;  Surgeon: Suzette Espy, MD;  [facility];  Service: Endoscopy;;  colon   CHOLECYSTECTOMY     COLONOSCOPY     in [place]   COLONOSCOPY WITH PROPOFOL  N/A 05/20/2013   Dr.Rourk- inadequate prep, normal appearing rectum, grossly normal colon aside from pancolonic diverticula, normal terminal ileum bx= unremarkable colonic mucosa. Due for early interval 2016.    COLONOSCOPY WITH PROPOFOL  N/A 04/26/2014   ZOX:WRUEAV ileum/one colon polyp removed/moderate pan-colonic diverticulosis/large internal hemorrhoids   COLONOSCOPY WITH PROPOFOL  N/A 12/23/2014   Dr.Rourk- minimal internal hemorrhoids, pancolonic diverticulosis   COLONOSCOPY WITH PROPOFOL  N/A 08/23/2016   Procedure: COLONOSCOPY WITH PROPOFOL ;  Surgeon: Alyce Jubilee, MD;  [facility];  Service: Endoscopy;  Laterality: N/A;   COLONOSCOPY WITH PROPOFOL  N/A 12/24/2018   Pancolonic diverticulosis, normal  TI, one 5 mm polyp in rectum (tubular adenoma). Somewhat friable hemorrhagic mucosa in area of IC valve/cecum, possibly related to scope trauma s/p biopsy. Repeat in 7 years.   DEBRIDEMENT OF ABDOMINAL WALL ABSCESS N/A 02/08/2013   Procedure: DEBRIDEMENT OF ABDOMINAL WALL ABSCESS;  Surgeon: Beau Bound, MD;  Location: [facility];  Service: General;  Laterality: N/A;   ESOPHAGOGASTRODUODENOSCOPY (EGD) WITH PROPOFOL  N/A 05/20/2013   Dr.Rourk- s/p prior gastric surgery with normal esophagus, residual gastric mucosa and patent efferent limb   ESOPHAGOGASTRODUODENOSCOPY (EGD) WITH PROPOFOL  N/A 02/03/2014   Dr. Riley Cheadle:  s/p hemigastrectomy with retained gastric contents. Residual gastric mucosa and  efferent limb appeared normal otherwise. Query gastroparesis.    ESOPHAGOGASTRODUODENOSCOPY (EGD) WITH PROPOFOL  N/A 04/26/2014   ZOX:WRUEAVWU'J ring/small HH/mild non-erosive gasrtitis/normal anastomosis   ESOPHAGOGASTRODUODENOSCOPY (EGD) WITH PROPOFOL  N/A 12/23/2014   Dr.Rourk- s/p prior hemigastrctomy, active oozing from anastomotic suture site, hemostasis achieved   ESOPHAGOGASTRODUODENOSCOPY (EGD) WITH PROPOFOL  N/A 05/23/2016   Dr. 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    ESOPHAGOGASTRODUODENOSCOPY (EGD) WITH PROPOFOL  N/A 05/11/2017   Procedure: ESOPHAGOGASTRODUODENOSCOPY (EGD) WITH PROPOFOL ;  Surgeon: Suzette Espy, MD;  [facility];  Service: Endoscopy;  Laterality: N/A;   ESOPHAGOGASTRODUODENOSCOPY (EGD) WITH PROPOFOL  N/A 06/07/2021   Procedure: ESOPHAGOGASTRODUODENOSCOPY (EGD) WITH PROPOFOL ;  Surgeon: Suzette Espy, MD;  [facility];  Service: Endoscopy;  Laterality: N/A;  11:45am   ESOPHAGOGASTRODUODENOSCOPY (EGD) WITH PROPOFOL  N/A 10/01/2021   Procedure: ESOPHAGOGASTRODUODENOSCOPY (EGD) WITH PROPOFOL ;  Surgeon: Vinetta Greening, DO;  [facility];  Service: Endoscopy;  Laterality: N/A;  3:00pm   EXCISION OF MESH  08/2019   HEMORRHOID SURGERY N/A 06/18/2017   Procedure: THREE COLUMN EXTENSIVE HEMORRHOIDECTOMY;  Surgeon: Awilda Bogus, MD;  Location: [facility];  Service: General;  Laterality: N/A;   HERNIA REPAIR     INCISION AND DRAINAGE ABSCESS N/A 01/06/2017   Procedure: INCISION AND DRAINAGE ABDOMINAL WALL ABSCESS;  Surgeon: Alanda Allegra, MD;  Location: [facility];  Service: General;  Laterality: N/A;   incisional hernial repair  09/05/2021   [place]   LEFT HEART CATH AND CORONARY ANGIOGRAPHY N/A 05/09/2020   Procedure: LEFT HEART CATH AND CORONARY ANGIOGRAPHY;  Surgeon: Cody Das, MD;  [facility]: [facility];  Service:  Cardiovascular;  Laterality: N/A;   LEFT HEART CATH AND CORONARY ANGIOGRAPHY N/A 08/22/2023   Procedure: LEFT HEART CATH AND CORONARY ANGIOGRAPHY;  Surgeon: Odie Benne, MD;  Location: [facility];  Service: Cardiovascular;  Laterality: N/A;   POLYPECTOMY  12/24/2018   Procedure: POLYPECTOMY;  Surgeon: Suzette Espy, MD;  [facility];  Service: Endoscopy;;  colon   tendon repar Right    wrist   WOUND EXPLORATION Right 06/24/2013   Procedure: exploration of traumatic wound right wrist;  Surgeon: Milagros Alf, MD;  Location: [facility];  Service: Orthopedics;  Laterality: Right;    Allergies  Allergies  Allergen Reactions   Metoclopramide  Anaphylaxis and Other (See Comments)    Reglan  EPS symptoms  Jerking movements   Codeine Hives     Labs/Other Studies Reviewed    The following studies were reviewed today:  Cardiac Studies & Procedures   ______________________________________________________________________________________________ CARDIAC CATHETERIZATION  CARDIAC CATHETERIZATION 08/22/2023  Conclusion   Prox LAD to Mid LAD lesion is 20% stenosed.   Dist RCA lesion is 20% stenosed.   Prox RCA lesion is 30%  stenosed.  Mild non-obstructive CAD Normal LV systolic function LVEDP 21 mmHg  Recommendations: Medical management of mild CAD.  Findings Coronary Findings Diagnostic  Dominance: Right  Left Anterior Descending Prox LAD to Mid LAD lesion is 20% stenosed. The lesion is moderately calcified.  Left Circumflex  Second Obtuse Marginal Branch  Right Coronary Artery Prox RCA lesion is 30% stenosed. Dist RCA lesion is 20% stenosed.  Right Posterior Atrioventricular Artery  Intervention  No interventions have been documented.   CARDIAC CATHETERIZATION  CARDIAC CATHETERIZATION 05/09/2020  Conclusion Images from the original result were not included. LM: Normal LAD: Prox 20% calcific disease Diag 2 ostial 50%, prox 40%  stenoses LCx: No significant disease RCA: Distal RCA focal 40% stenosis  TIMI III flow in all vessels in spite of IC NTG suggests possible microvascular dysfunction   Cody Das, MD Pager: [phone] Office: [phone]  Findings Coronary Findings Diagnostic  Dominance: Right  Left Anterior Descending Prox LAD lesion is 20% stenosed. The lesion is moderately calcified.  Left Circumflex  Second Obtuse Marginal Branch 2nd Mrg-1 lesion is 50% stenosed. 2nd Mrg-2 lesion is 40% stenosed.  Right Coronary Artery Dist RCA lesion is 40% stenosed.  Right Posterior Atrioventricular Artery  Intervention  No interventions have been documented.   STRESS TESTS  NM PET CT CARDIAC PERFUSION MULTI W/ABSOLUTE BLOODFLOW 08/19/2023  Narrative   Small reversible apical perfusion defect, suggesting small area of ischemia.  Myocardial blood flow reserve is reduced globally (1.8) and in each coronary distribution.  No high risk findings such as TID or drop in EF with stress.  Severe coronary calcifications on CT.  Differential diagnosis includes microvascular disease vs severe multivessel CAD.  Overall, study is intermediate risk   LV perfusion is abnormal. There is evidence of ischemia. Defect 1: There is a medium defect with mild reduction in uptake present in the apical anterior, lateral and apex location(s) that is reversible. There is normal wall motion in the defect area. Consistent with ischemia.   Rest left ventricular function is normal. Rest EF: 60%. Stress left ventricular function is normal. Stress EF: 73%. End diastolic cavity size is normal. End systolic cavity size is normal.   Myocardial blood flow was computed to be 1.92ml/g/min at rest and 1.52ml/g/min at stress. Global myocardial blood flow reserve was 1.82 and was abnormal.   Coronary calcium  was present on the attenuation correction CT images. Severe coronary calcifications were present. Coronary calcifications were  present in the left anterior descending artery, left circumflex artery and right coronary artery distribution(s).   Findings are consistent with ischemia. The study is intermediate risk.   Electronically signed by Carson Clara, MD  EXAM: OVER-READ INTERPRETATION  CT CHEST  The following report is a limited chest CT over-read performed by radiologist Dr. Marcos Sevin [facility] on 08/19/2023. This over-read does not include interpretation of cardiac or coronary anatomy or pathology nor does it include evaluation of the PET data. The cardiac PET-CT interpretation by the cardiologist is attached.  COMPARISON:  Chest CTA 07/12/2023  FINDINGS: Mediastinum/Nodes: No enlarged lymph nodes within the visualized mediastinum.Mild cardiomegaly, aortic and coronary artery atherosclerosis noted.  Lungs/Pleura: There is no pleural effusion. Stable linear scarring inferiorly in the right upper lobe and calcified right middle lobe granuloma.  Upper abdomen: Postsurgical changes in the upper abdomen from previous right adrenalectomy and gastric bypass procedure. Unchanged 2.1 cm low-density left adrenal adenoma for which no specific follow-up imaging is recommended.  Musculoskeletal/Chest wall: No chest wall mass  or suspicious osseous findings within the visualized chest.  IMPRESSION: 1. No acute extra cardiac findings. 2. Stable left adrenal adenoma for which no specific follow-up imaging is recommended. 3.  Aortic Atherosclerosis (ICD10-I70.0).   Electronically Signed By: Elmon Hagedorn M.D. On: 08/19/2023 13:43   ECHOCARDIOGRAM  ECHOCARDIOGRAM COMPLETE 03/15/2023  Narrative ECHOCARDIOGRAM REPORT    Patient Name:   Angel Price Date of Exam: 03/15/2023 Medical Rec #:  [id number]        Height:       63.0 in Accession #:    [id number]       Weight:       145.0 lb Date of Birth:  Jan 28, 1966        BSA:          1.687 m Patient Age:    57 years          BP:           138/91 mmHg Patient Gender: F                HR:           68 bpm. Exam Location:  [facility]  Procedure: 2D Echo, Cardiac Doppler and Color Doppler  Indications:    TIA  History:        Patient has prior history of Echocardiogram examinations, most recent 05/20/2019. Abnormal ECG, Stroke, Arrythmias:Atrial Fibrillation, Signs/Symptoms:Syncope; Risk Factors:Hypertension. Lupus. Cocaine use. ETOH.  Sonographer:    Raynelle Callow RDCS Referring Phys: [phone] JACOB J STINSON  IMPRESSIONS   1. Left ventricular ejection fraction, by estimation, is 60 to 65%. The left ventricle has normal function. The left ventricle has no regional wall motion abnormalities. Left ventricular diastolic parameters were normal. 2. Right ventricular systolic function is normal. The right ventricular size is normal. There is normal pulmonary artery systolic pressure. 3. The mitral valve is abnormal. Trivial mitral valve regurgitation. No evidence of mitral stenosis. 4. The aortic valve is tricuspid. Aortic valve regurgitation is not visualized. No aortic stenosis is present. 5. Aortic dilatation noted. There is mild dilatation of the aortic root, measuring 38 mm. 6. The inferior vena cava is normal in size with greater than 50% respiratory variability, suggesting right atrial pressure of 3 mmHg.  FINDINGS Left Ventricle: Left ventricular ejection fraction, by estimation, is 60 to 65%. The left ventricle has normal function. The left ventricle has no regional wall motion abnormalities. The left ventricular internal cavity size was normal in size. There is no left ventricular hypertrophy. Left ventricular diastolic parameters were normal.  Right Ventricle: The right ventricular size is normal. No increase in right ventricular wall thickness. Right ventricular systolic function is normal. There is normal pulmonary artery systolic pressure. The tricuspid regurgitant velocity is 2.50 m/s, and with an assumed  right atrial pressure of 8 mmHg, the estimated right ventricular systolic pressure is 33.0 mmHg.  Left Atrium: Left atrial size was normal in size.  Right Atrium: Right atrial size was normal in size.  Pericardium: There is no evidence of pericardial effusion.  Mitral Valve: The mitral valve is abnormal. There is mild thickening of the mitral valve leaflet(s). Trivial mitral valve regurgitation. No evidence of mitral valve stenosis.  Tricuspid Valve: The tricuspid valve is normal in structure. Tricuspid valve regurgitation is not demonstrated. No evidence of tricuspid stenosis.  Aortic Valve: The aortic valve is tricuspid. Aortic valve regurgitation is not visualized. No aortic stenosis is present.  Pulmonic Valve: The pulmonic valve was normal in structure.  Pulmonic valve regurgitation is not visualized. No evidence of pulmonic stenosis.  Aorta: Aortic dilatation noted. There is mild dilatation of the aortic root, measuring 38 mm.  Venous: The inferior vena cava is normal in size with greater than 50% respiratory variability, suggesting right atrial pressure of 3 mmHg.  IAS/Shunts: No atrial level shunt detected by color flow Doppler.   LEFT VENTRICLE PLAX 2D LVIDd:         4.20 cm     Diastology LVIDs:         3.00 cm     LV e' medial:    5.00 cm/s LV PW:         1.60 cm     LV E/e' medial:  17.9 LV IVS:        1.00 cm     LV e' lateral:   6.20 cm/s LVOT diam:     2.10 cm     LV E/e' lateral: 14.5 LV SV:         81 LV SV Index:   48 LVOT Area:     3.46 cm  LV Volumes (MOD) LV vol d, MOD A2C: 75.6 ml LV vol d, MOD A4C: 44.9 ml LV vol s, MOD A2C: 25.1 ml LV vol s, MOD A4C: 17.3 ml LV SV MOD A2C:     50.5 ml LV SV MOD A4C:     44.9 ml LV SV MOD BP:      35.7 ml  RIGHT VENTRICLE             IVC RV S prime:     10.90 cm/s  IVC diam: 2.00 cm TAPSE (M-mode): 1.7 cm  LEFT ATRIUM             Index        RIGHT ATRIUM           Index LA diam:        3.80 cm 2.25 cm/m   RA  Area:     10.20 cm LA Vol (A2C):   32.7 ml 19.39 ml/m  RA Volume:   18.50 ml  10.97 ml/m LA Vol (A4C):   29.8 ml 17.67 ml/m LA Biplane Vol: 31.2 ml 18.50 ml/m AORTIC VALVE LVOT Vmax:   115.00 cm/s LVOT Vmean:  70.400 cm/s LVOT VTI:    0.234 m  AORTA Ao Root diam: 3.80 cm Ao Asc diam:  3.60 cm  MITRAL VALVE               TRICUSPID VALVE MV Area (PHT): 3.88 cm    TR Peak grad:   25.0 mmHg MV Decel Time: 196 msec    TR Vmax:        250.00 cm/s MV E velocity: 89.65 cm/s MV A velocity: 95.90 cm/s  SHUNTS MV E/A ratio:  0.93        Systemic VTI:  0.23 m Systemic Diam: 2.10 cm  Janelle Mediate MD Electronically signed by Janelle Mediate MD Signature Date/Time: 03/15/2023/3:14:22 PM    Final          ______________________________________________________________________________________________     Recent Labs: 03/15/2023: TSH 0.903 08/21/2023: ALT 29; B Natriuretic Peptide 10.0 08/22/2023: Magnesium  2.0 08/23/2023: BUN 5; Creatinine, Ser 0.58; Hemoglobin 11.9; Platelets 272; Potassium 3.9; Sodium 136  Recent Lipid Panel    Component Value Date/Time   CHOL 74 08/23/2023 0300   TRIG 51 08/23/2023 0300   HDL 31 (L) 08/23/2023 0300   CHOLHDL 2.4 08/23/2023 0300   VLDL  10 08/23/2023 0300   LDLCALC 33 08/23/2023 0300   LDLDIRECT 129 (H) 03/16/2013 1156    History of Present Illness    58 year old female with the above past medical history including nonobstructive CAD, hypertension, hyperlipidemia, prior tobacco use, and prior alcohol dependence.  She established with Dr. Filiberto Hug in the setting of chest pain with multiple ED visits.  On 1 occasion she was hospitalized in the setting of symptoms concerning for stroke.  Neurological evaluation including MRI of the brain was unremarkable.  There was mention of possible atrial fibrillation, however, no evidence of atrial fibrillation on monitor or EKG.  Cardiac catheterization in 2022 revealed mild nonobstructive CAD, EF 55 to 65%.   Most recent echocardiogram in 02/2023 showed EF 60 to 65%, normal LV function, no RWMA, normal RV systolic function, no significant valvular abnormalities, mild dilation of the aortic root measuring 38 mm.  She was last seen in the office on 07/15/2023 and reported constant chest pain radiating to her left arm.  Cardiac PET stress test in 08/2023 suggestive of small area of ischemia, EF 60%.  Follow-up cardiac catheterization on 08/22/2023 revealed mild nonobstructive CAD (20% proximal to mid LAD, 30% proximal RCA, 20% distal RCA, normal LV systolic function).  Ongoing medical therapy was recommended.  She presents today for follow-up.  Since her last visit and since her procedure  Nonobstructive CAD: Hypertension: Hyperlipidemia: Disposition:  Home Medications    Current Outpatient Medications  Medication Sig Dispense Refill   amLODipine  (NORVASC ) 10 MG tablet Take 10 mg by mouth daily.     aspirin  EC 81 MG EC tablet Take 1 tablet (81 mg total) by mouth daily. 30 tablet 0   Cholecalciferol  50 MCG (2000 UT) CAPS Take 1 tablet by mouth daily.     cyclobenzaprine  (FLEXERIL ) 5 MG tablet Take 1 tablet (5 mg total) by mouth 2 (two) times daily as needed for muscle spasms. 30 tablet 0   DULoxetine  (CYMBALTA ) 30 MG capsule Take 30 mg by mouth 2 (two) times daily.     gabapentin  (NEURONTIN ) 100 MG capsule Take 100 mg by mouth 3 (three) times daily.     HYDROcodone -acetaminophen  (NORCO/VICODIN) 5-325 MG tablet Take one tab po q 4 hrs prn pain (Patient taking differently: Take 1 tablet by mouth every 4 (four) hours as needed for moderate pain (pain score 4-6). Take one tab po q 4 hrs prn pain) 12 tablet 0   hydroxychloroquine  (PLAQUENIL ) 200 MG tablet Take 1.5 tablets (300 mg total) by mouth daily. 135 tablet 1   ibuprofen  (ADVIL ) 800 MG tablet Take 800 mg by mouth 2 (two) times daily as needed for mild pain (pain score 1-3).     metoprolol  tartrate (LOPRESSOR ) 50 MG tablet Take 50 mg by mouth 2 (two) times  daily.     mirtazapine  (REMERON ) 15 MG tablet Take 15 mg by mouth at bedtime as needed (for sleep).     nitroGLYCERIN  (NITROSTAT ) 0.4 MG SL tablet Place 1 tablet (0.4 mg total) under the tongue every 5 (five) minutes as needed for chest pain. 30 tablet 3   ondansetron  (ZOFRAN -ODT) 4 MG disintegrating tablet Take 4 mg by mouth every 4 (four) hours as needed for nausea or vomiting.     pantoprazole  (PROTONIX ) 40 MG tablet Take 1 tablet (40 mg total) by mouth daily before breakfast. 30 tablet 3   Rimegepant Sulfate  (NURTEC) 75 MG TBDP Take 75 mg by mouth as needed. (Patient taking differently: Take 75 mg by mouth as needed (  migraines).) 8 tablet 6   rosuvastatin  (CRESTOR ) 20 MG tablet Take 1 tablet (20 mg total) by mouth daily. 90 tablet 3   thiamine  (VITAMIN B1) 100 MG tablet Take 100 mg by mouth daily.     No current facility-administered medications for this visit.     Review of Systems    ***.  All other systems reviewed and are otherwise negative except as noted above.    Physical Exam    VS:  LMP  (LMP Unknown)  , BMI There is no height or weight on file to calculate BMI.     GEN: Well nourished, well developed, in no acute distress. HEENT: normal. Neck: Supple, no JVD, carotid bruits, or masses. Cardiac: RRR, no murmurs, rubs, or gallops. No clubbing, cyanosis, edema.  Radials/DP/PT 2+ and equal bilaterally.  Respiratory:  Respirations regular and unlabored, clear to auscultation bilaterally. GI: Soft, nontender, nondistended, BS + x 4. MS: no deformity or atrophy. Skin: warm and dry, no rash. Neuro:  Strength and sensation are intact. Psych: Normal affect.  Accessory Clinical Findings    ECG personally reviewed by me today -    - no acute changes.   Lab Results  Component Value Date   WBC 4.8 08/23/2023   HGB 11.9 (L) 08/23/2023   HCT 34.4 (L) 08/23/2023   MCV 75.1 (L) 08/23/2023   PLT 272 08/23/2023   Lab Results  Component Value Date   CREATININE 0.58 08/23/2023    BUN 5 (L) 08/23/2023   NA 136 08/23/2023   K 3.9 08/23/2023   CL 109 08/23/2023   CO2 22 08/23/2023   Lab Results  Component Value Date   ALT 29 08/21/2023   AST 25 08/21/2023   ALKPHOS 87 08/21/2023   BILITOT 0.7 08/21/2023   Lab Results  Component Value Date   CHOL 74 08/23/2023   HDL 31 (L) 08/23/2023   LDLCALC 33 08/23/2023   LDLDIRECT 129 (H) 03/16/2013   TRIG 51 08/23/2023   CHOLHDL 2.4 08/23/2023    Lab Results  Component Value Date   HGBA1C 5.1 03/15/2023    Assessment & Plan    1.  ***  No BP recorded.  {Refresh Note OR Click here to enter BP  :1}***   Jude Norton, NP 09/01/2023, 9:48 AM

## 2023-09-01 NOTE — Therapy (Signed)
 OUTPATIENT PHYSICAL THERAPY THORACOLUMBAR EVALUATION   Patient Name: Angel Price MRN: [id number] DOB:[birth year], 58 y.o., female Today's Date: 09/04/2023  END OF SESSION:   09/02/23 1020  PT Visits / Re-Eval  Visit Number 1  Number of Visits 8  Date for PT Re-Evaluation 10/10/23  Authorization  Authorization Type [employer]  Authorization Time Period please check auth  Progress Note Due on Visit 8  PT Time Calculation  PT Start Time 1020  PT Stop Time 1055  PT Time Calculation (min) 35 min  PT - End of Session  Activity Tolerance Patient tolerated treatment well;Patient limited by pain  Behavior During Therapy [facility] for tasks assessed/performed     Past Medical History:  Diagnosis Date   Abscess    soft tissue   Adrenal mass (HCC)    Alcohol abuse    Anemia    Anxiety    Blood transfusion without reported diagnosis    Chronic abdominal pain    Chronic wound infection of abdomen    Colon polyp    colonoscopy 04/2014   Depression    Discoid lupus erythematosus    Diverticulosis    colonoscopy 04/2014 moderat pan colonic   Drug-seeking behavior    Gastritis    EGD 05/2014   Gastroparesis 01/2014   GERD (gastroesophageal reflux disease)    Hemorrhoid    internal large   Hiatal hernia    History of Billroth II operation    Hypertension    Lung nodule    CT 02/2014 needs repeat 1 month   Lung nodule < 6cm on CT 04/25/2014   Lupus    Malingering    Migraine    Nausea and vomiting    chronic, recurrent   Pancreatitis    Schatzki's ring    patent per EGD 04/2014   Sickle cell trait (HCC)    Stroke (HCC)    Suicide attempt (HCC)    Thyroid  disease 2000   overactive, radiation   Past Surgical History:  Procedure Laterality Date   ABDOMINAL HYSTERECTOMY  2013   [place]   ABDOMINAL HYSTERECTOMY     ABDOMINAL SURGERY     ADRENALECTOMY Right    AGILE CAPSULE N/A 01/05/2015   Procedure: AGILE CAPSULE;  Surgeon: Suzette Espy, MD;  [facility];  Service: Endoscopy;  Laterality: N/A;  0700   BALLOON DILATION N/A 10/01/2021   Procedure: BALLOON DILATION;  Surgeon: Vinetta Greening, DO;  [facility];  Service: Endoscopy;  Laterality: N/A;   Billroth II procedure      [place], first 2000, 2005/2006.   bilroth 2     BIOPSY  05/20/2013   Procedure: BIOPSIES OF ASCENDING AND SIGMOID COLON;  Surgeon: Suzette Espy, MD;  Location: [facility];  Service: Endoscopy;;   BIOPSY  04/26/2014   Procedure: BIOPSIES;  Surgeon: Alyce Jubilee, MD;  Location: [facility];  Service: Endoscopy;;   BIOPSY  12/24/2018   Procedure: BIOPSY;  Surgeon: Suzette Espy, MD;  [facility];  Service: Endoscopy;;  colon   CHOLECYSTECTOMY     COLONOSCOPY     in [place]   COLONOSCOPY WITH PROPOFOL  N/A 05/20/2013   Dr.Rourk- inadequate prep, normal appearing rectum, grossly normal colon aside from pancolonic diverticula, normal terminal ileum bx= unremarkable colonic mucosa. Due for early interval 2016.    COLONOSCOPY WITH PROPOFOL  N/A 04/26/2014   ONG:EXBMWU ileum/one colon polyp removed/moderate pan-colonic diverticulosis/large internal hemorrhoids   COLONOSCOPY WITH PROPOFOL  N/A  12/23/2014   Dr.Rourk- minimal internal hemorrhoids, pancolonic diverticulosis   COLONOSCOPY WITH PROPOFOL  N/A 08/23/2016   Procedure: COLONOSCOPY WITH PROPOFOL ;  Surgeon: Alyce Jubilee, MD;  [facility];  Service: Endoscopy;  Laterality: N/A;   COLONOSCOPY WITH PROPOFOL  N/A 12/24/2018   Pancolonic diverticulosis, normal TI, one 5 mm polyp in rectum (tubular adenoma). Somewhat friable hemorrhagic mucosa in area of IC valve/cecum, possibly related to scope trauma s/p biopsy. Repeat in 7 years.   DEBRIDEMENT OF ABDOMINAL WALL ABSCESS N/A 02/08/2013   Procedure: DEBRIDEMENT OF ABDOMINAL WALL ABSCESS;  Surgeon: Beau Bound, MD;  Location: [facility];  Service: General;  Laterality: N/A;   ESOPHAGOGASTRODUODENOSCOPY (EGD) WITH PROPOFOL  N/A 05/20/2013    Dr.Rourk- s/p prior gastric surgery with normal esophagus, residual gastric mucosa and patent efferent limb   ESOPHAGOGASTRODUODENOSCOPY (EGD) WITH PROPOFOL  N/A 02/03/2014   Dr. Riley Cheadle:  s/p hemigastrectomy with retained gastric contents. Residual gastric mucosa and efferent limb appeared normal otherwise. Query gastroparesis.    ESOPHAGOGASTRODUODENOSCOPY (EGD) WITH PROPOFOL  N/A 04/26/2014   NWG:NFAOZHYQ'M ring/[facility]/mild non-erosive gasrtitis/normal anastomosis   ESOPHAGOGASTRODUODENOSCOPY (EGD) WITH PROPOFOL  N/A 12/23/2014   Dr.Rourk- s/p prior hemigastrctomy, active oozing from anastomotic suture site, hemostasis achieved   ESOPHAGOGASTRODUODENOSCOPY (EGD) WITH PROPOFOL  N/A 05/23/2016   Dr. 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    ESOPHAGOGASTRODUODENOSCOPY (EGD) WITH PROPOFOL  N/A 05/11/2017   Procedure: ESOPHAGOGASTRODUODENOSCOPY (EGD) WITH PROPOFOL ;  Surgeon: Suzette Espy, MD;  [facility];  Service: Endoscopy;  Laterality: N/A;   ESOPHAGOGASTRODUODENOSCOPY (EGD) WITH PROPOFOL  N/A 06/07/2021   Procedure: ESOPHAGOGASTRODUODENOSCOPY (EGD) WITH PROPOFOL ;  Surgeon: Suzette Espy, MD;  [facility];  Service: Endoscopy;  Laterality: N/A;  11:45am   ESOPHAGOGASTRODUODENOSCOPY (EGD) WITH PROPOFOL  N/A 10/01/2021   Procedure: ESOPHAGOGASTRODUODENOSCOPY (EGD) WITH PROPOFOL ;  Surgeon: Vinetta Greening, DO;  [facility];  Service: Endoscopy;  Laterality: N/A;  3:00pm   EXCISION OF MESH  08/2019   HEMORRHOID SURGERY N/A 06/18/2017   Procedure: THREE COLUMN EXTENSIVE HEMORRHOIDECTOMY;  Surgeon: Awilda Bogus, MD;  Location: [facility];  Service: General;  Laterality: N/A;   HERNIA REPAIR     INCISION AND DRAINAGE ABSCESS N/A 01/06/2017   Procedure: INCISION AND DRAINAGE ABDOMINAL WALL ABSCESS;  Surgeon: Alanda Allegra, MD;  Location: [facility];  Service: General;   Laterality: N/A;   incisional hernial repair  09/05/2021   [place]   LEFT HEART CATH AND CORONARY ANGIOGRAPHY N/A 05/09/2020   Procedure: LEFT HEART CATH AND CORONARY ANGIOGRAPHY;  Surgeon: Cody Das, MD;  [facility]: [facility];  Service: Cardiovascular;  Laterality: N/A;   LEFT HEART CATH AND CORONARY ANGIOGRAPHY N/A 08/22/2023   Procedure: LEFT HEART CATH AND CORONARY ANGIOGRAPHY;  Surgeon: Odie Benne, MD;  Location: [facility];  Service: Cardiovascular;  Laterality: N/A;   POLYPECTOMY  12/24/2018   Procedure: POLYPECTOMY;  Surgeon: Suzette Espy, MD;  [facility];  Service: Endoscopy;;  colon   tendon repar Right    wrist   WOUND EXPLORATION Right 06/24/2013   Procedure: exploration of traumatic wound right wrist;  Surgeon: Milagros Alf, MD;  Location: [facility];  Service: Orthopedics;  Laterality: Right;   Patient Active Problem List   Diagnosis Date Noted   Chest pain 08/21/2023   Dyslipidemia 08/21/2023   Rectal pressure 08/05/2023   Abdominal bloating 08/05/2023   Anemia 08/05/2023   High  risk medication use 05/05/2023   Unilateral primary osteoarthritis, left knee 05/05/2023   Seropositive rheumatoid arthritis (HCC) 05/02/2022   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   Cellulitis 10/06/2021   Paroxysmal atrial fibrillation (HCC) 10/06/2021   Abdominal fluid collection 10/06/2021   Constipation 09/14/2021   Ventral hernia 05/15/2021   Dysphagia 05/15/2021   Tobacco dependence 09/12/2020   Continuous dependence on cigarette smoking 08/22/2020   Mixed hyperlipidemia 04/27/2020   Recurrent incisional hernia 02/03/2020   Bilateral knee pain 12/29/2019   Coronary artery disease of native artery of native heart with stable angina pectoris (HCC) 06/25/2019   Acute respiratory failure with hypoxia (HCC) 05/20/2019   Essential hypertension 05/20/2019   COVID-19 05/20/2019   Syncope and collapse 05/20/2019   Metabolic  acidosis 05/16/2019   Intractable vomiting with nausea 02/23/2019   Cocaine abuse (HCC)    Colitis 11/11/2018   Hemorrhoidal skin tag    Folate deficiency 05/11/2017   Abdominal wall fistula    Cellulitis of abdominal wall 01/02/2017   Acute left hemiparesis (HCC) 12/05/2016   Malingering 12/05/2016   Weakness of left lower extremity 12/02/2016   CVA (cerebral vascular accident) (HCC) 11/23/2016   Bright red rectal bleeding 08/22/2016   Hypotension due to blood loss    Acute GI bleeding 05/24/2016   Dieulafoy lesion of duodenum    History of Billroth II operation    Gastrointestinal hemorrhage 05/22/2016   Absolute anemia    Acute pancreatitis 10/01/2015   Alcohol abuse with intoxication (HCC)    Left-sided weakness    Psychosomatic factor in physical condition    Upper GI bleed    Diverticulosis of colon with hemorrhage    Chronic wound infection of abdomen 12/22/2014   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   HTN (hypertension) 12/22/2014   Ataxia 11/01/2014   Hemorrhoids 04/26/2014   Lung nodule 04/26/2014   Diverticulosis    Gastritis    Hiatal hernia    Schatzki's ring    Acute blood loss anemia 04/25/2014   Hypokalemia 04/25/2014   Hematemesis with nausea 04/25/2014   Lung nodule < 6cm on CT 04/25/2014   Intractable nausea and vomiting 04/24/2014   Gastroparesis    Abdominal pain 04/01/2014   Gastroenteritis 12/10/2013   Chronic abdominal wound infection 08/16/2013   MDD (major depressive disorder), recurrent episode, severe (HCC) 06/27/2013   Wrist laceration 06/24/2013   Diarrhea 05/07/2013   Rectal bleeding 05/07/2013   Adrenal mass, left (HCC) 05/07/2013   Post-operative wound abscess 04/19/2013   Abdominal wall abscess 04/18/2013   Essential hypertension, benign 03/30/2013   History of cocaine abuse (HCC) 03/16/2013   History of schizoaffective disorder 03/16/2013   Bipolar disorder (HCC) 03/16/2013   Personality disorder (HCC) 03/16/2013   Tobacco use  disorder 03/16/2013   Alcohol abuse 03/16/2013   Palpitations 03/16/2013   Poor vision 03/16/2013   History of gastric bypass 03/16/2013   Status post hysterectomy with oophorectomy 03/16/2013   Hypothyroid 03/16/2013   SLE (systemic lupus erythematosus) (HCC) 03/16/2013    PCP: Dr. Eldon Greenland  REFERRING PROVIDER: Brunson-Ollison, Joyce E, NP  REFERRING DIAG: Diagnosis M54.16 (ICD-10-CM) - Radiculopathy, lumbar region G89.4 (ICD-10-CM) - Chronic pain syndrome M47.816 (ICD-10-CM) - Spondylosis without myelopathy or radiculopathy, lumbar region  Rationale for Evaluation and Treatment: Rehabilitation  THERAPY DIAG:  Low back pain, unspecified back pain laterality, unspecified chronicity, unspecified whether sciatica present - Plan: PT plan of care cert/re-cert  Difficulty in walking, not elsewhere classified - Plan: PT plan of care cert/re-cert  Radiculopathy, lumbar  region - Plan: PT plan of care cert/re-cert  ONSET DATE: about 6 months or so  SUBJECTIVE:                                                                                                                                                                                           SUBJECTIVE STATEMENT: Back pain maybe 6 months or so; insidious onset; constant pain; worse with prolonged sitting and with prolonged walking; back pain that goes down into the legs; originally just left now it's both; having an epidural injection 09/08/23  PERTINENT HISTORY:  Lupus afib  PAIN:  Are you having pain? Yes: NPRS scale: 4-10/10 Pain location: low back down both legs to ankle Pain description: aching Aggravating factors: prolonged standing, sitting Relieving factors: pain medication, heat, ice  PRECAUTIONS: None    WEIGHT BEARING RESTRICTIONS: No  FALLS:  Has patient fallen in last 6 months? No   OCCUPATION: not currently working  PLOF: Independent  PATIENT GOALS: not to flex forward when walking  NEXT MD VISIT: 09/08/23  having an epidural injection  OBJECTIVE:  Note: Objective measures were completed at Evaluation unless otherwise noted.  DIAGNOSTIC FINDINGS:  IMPRESSION: Normal right hip radiographs.  L4-L5 mild degenerative changes per xray; clips in abdomen from multiple abdominal surgery  PATIENT SURVEYS:  Modified Oswestry 34/50 68%   COGNITION: Overall cognitive status: Within functional limits for tasks assessed     SENSATION: WFL Will occasionally get some numbness and tingling in left hand  MUSCLE LENGTH: Hamstrings: Right  deg; Left  deg POSTURE: rounded shoulders, forward head, and flexed trunk   PALPATION: Tender L3 to S1 paraspinals  LUMBAR ROM: *painful  AROM eval  Flexion Fingertips to top of patella*  Extension Unable to fully extend to neutral*  Right lateral flexion Fingertips 1 to righ  Left lateral flexion Fingertips 2 to left  Right rotation   Left rotation    (Blank rows = not tested)  LOWER EXTREMITY ROM:     Active  Right eval Left eval  Hip flexion    Hip extension    Hip abduction    Hip adduction    Hip internal rotation    Hip external rotation    Knee flexion    Knee extension    Ankle dorsiflexion    Ankle plantarflexion    Ankle inversion    Ankle eversion     (Blank rows = not tested)  LOWER EXTREMITY MMT:    MMT Right eval Left eval  Hip flexion 4+ 4*  Hip extension 3* 3-*  Hip abduction    Hip adduction  Hip internal rotation    Hip external rotation    Knee flexion 4+ 3+*  Knee extension 5 4+  Ankle dorsiflexion 5 4+  Ankle plantarflexion    Ankle inversion    Ankle eversion     (Blank rows = not tested)  FUNCTIONAL TESTS:  5 times sit to stand: 28.79 SLS unable either side  GAIT: Distance walked: 50 ft in clinic Assistive device utilized: None Level of assistance: Modified independence Comments: antalgic slow gait  TREATMENT DATE: 09/02/23 physical therapy evaluation and HEP instruction                                                                                                                                  PATIENT EDUCATION:  Education details: Patient educated on exam findings, POC, scope of PT, HEP. Person educated: Patient Education method: Explanation, Demonstration, and Handouts Education comprehension: verbalized understanding, returned demonstration, verbal cues required, and tactile cues required   HOME EXERCISE PROGRAM: Access Code: [email or web address] URL: [email or web address] Date: 09/02/2023 Prepared by: [facility]  Exercises - Supine Lower Trunk Rotation  - 2 x daily - 7 x weekly - 1 sets - 10 reps - 5 sec hold - Hooklying Single Knee to Chest Stretch with Towel  - 2 x daily - 7 x weekly - 1 sets - 5 reps - 10 sec hold - Supine Transversus Abdominis Bracing - Hands on Stomach  - 2 x daily - 7 x weekly - 1 sets - 10 reps - 5 sec hold  ASSESSMENT:  CLINICAL IMPRESSION: Patient is a 58 y.o. female who was seen today for physical therapy evaluation and treatment for Diagnosis M54.16 (ICD-10-CM) - Radiculopathy, lumbar region G89.4 (ICD-10-CM) - Chronic pain syndrome M47.816 (ICD-10-CM) - Spondylosis without myelopathy or radiculopathy, lumbar region.  Patient demonstrates muscle weakness, reduced ROM, and fascial restrictions which are likely contributing to symptoms of pain and are negatively impacting patient ability to perform ADLs and functional mobility tasks. Patient will benefit from skilled physical therapy services to address these deficits to reduce pain and improve level of function with ADLs and functional mobility tasks.   OBJECTIVE IMPAIRMENTS: Abnormal gait, decreased activity tolerance, decreased balance, decreased mobility, difficulty walking, decreased ROM, decreased strength, increased fascial restrictions, impaired perceived functional ability, and pain.   ACTIVITY LIMITATIONS: carrying, lifting, bending, sitting, standing, squatting, sleeping, stairs,  bed mobility, locomotion level, and caring for others  PARTICIPATION LIMITATIONS: meal prep, cleaning, laundry, medication management, and community activity  PERSONAL FACTORS: 1 comorbidity: lupus are also affecting patient's functional outcome.   REHAB POTENTIAL: Good  CLINICAL DECISION MAKING: Evolving/moderate complexity  EVALUATION COMPLEXITY: Moderate   GOALS: Goals reviewed with patient? No  SHORT TERM GOALS: Target date: 09/23/2023  patient will be independent with initial HEP   Baseline: Goal status: INITIAL  2.  Patient will report 50% improvement overall  Baseline:  Goal status: INITIAL   LONG TERM GOALS: Target date: 10/10/2023  Patient will be independent in self management strategies to improve quality of life and functional outcomes.  Baseline:  Goal status: INITIAL  2.  Patient will report 75% improvement overall  Baseline:  Goal status: INITIAL  3.  Patient will able to stand SLS on left leg x 10 to demonstrate improved functional balance  Baseline: unable each side at eval Goal status: INITIAL  4.  Patient will improve 5 times sit to stand score to 20 sec or less to demonstrate improved functional mobility and increased leg strength.    Baseline: 28.79 Goal status: INITIAL  5.   Patient will increase bilateral leg MMT's to 4+ to 5/5 to allow navigation of steps without gait deviation or loss of balance  Baseline: see above Goal status: INITIAL  6.  Patient will improve Modified Oswestry score by 14 points to demonstrate improved perceived function  Baseline: 34/50 at eval Goal status: INITIAL  PLAN:  PT FREQUENCY: 2x/week  PT DURATION: 4 weeks  PLANNED INTERVENTIONS:[id number]- PT Re-evaluation, 97110-Therapeutic exercises, 97530- Therapeutic activity, 97112- Neuromuscular re-education, 97535- Self Care, 16109- Manual therapy, [id number]- Gait training, [phone]- Orthotic Fit/training, [phone]- Canalith repositioning, [id number]- Aquatic Therapy,  97760- Splinting, [phone]- Wound care (first 20 sq cm), 97598- Wound care (each additional 20 sq cm)Patient/Family education, Balance training, Stair training, Taping, Dry Needling, Joint mobilization, Joint manipulation, Spinal manipulation, Spinal mobilization, Scar mobilization, and DME instructions.  PLAN FOR NEXT SESSION: Review HEP and goals; will miss next week due to having an epidural.  Lumbar mobility and core strengthening.     7:09 AM, 09/04/23 Angel Price Angel Price Angel Price MPT [facility] [place] [phone] [phone]  [facility] Medicare Auth Request Information  Date of referral: 08/25/2023 Referring provider: Essie Hefty NP Referring diagnosis (ICD 10)? M54.5 Treatment diagnosis (ICD 10)? (if different than referring diagnosis) M54.5, R26.2, M54.16  Functional Tool Score: Modified Oswestry 34/50  What was this (referring dx) caused by? Ongoing Issue and Unspecified  Nature of Condition: Chronic (continuous duration > 3 months)   Laterality: Both  Current Functional Measure Score: Other Modified Oswestry 34/50 68%  Objective measurements identify impairments when they are compared to normal values, the uninvolved extremity, and prior level of function.  [x]  Yes  []  No  Objective assessment of functional ability: Moderate functional limitations   Briefly describe symptoms: insidious onset of back pain that radiates into legs  How did symptoms start: unknown;   Average pain intensity:  Last 24 hours: 4-10  Past week: 4-10  How often does the pt experience symptoms? Constantly  How much have the symptoms interfered with usual daily activities? Quite a bit  How has condition changed since care began at this facility? A little worse  In general, how is the patients overall health? Good   BACK PAIN (STarT Back Screening Tool) Has pain spread down the leg(s) at some time in the last 2 weeks? yes Has the pt only walked short distances because of back pain?  yes Has patient dressed more slowly because of back pain in the past 2 weeks? yes Has patient stopped enjoying things they usually enjoy? yes

## 2023-09-02 ENCOUNTER — Other Ambulatory Visit: Payer: Self-pay

## 2023-09-02 ENCOUNTER — Ambulatory Visit (HOSPITAL_COMMUNITY): Attending: Nurse Practitioner

## 2023-09-02 DIAGNOSIS — M545 Low back pain, unspecified: Secondary | ICD-10-CM | POA: Diagnosis present

## 2023-09-02 DIAGNOSIS — M5416 Radiculopathy, lumbar region: Secondary | ICD-10-CM | POA: Diagnosis present

## 2023-09-02 DIAGNOSIS — R262 Difficulty in walking, not elsewhere classified: Secondary | ICD-10-CM | POA: Insufficient documentation

## 2023-09-09 ENCOUNTER — Other Ambulatory Visit: Payer: Self-pay

## 2023-09-09 ENCOUNTER — Encounter (HOSPITAL_COMMUNITY): Payer: Self-pay

## 2023-09-09 ENCOUNTER — Emergency Department (HOSPITAL_COMMUNITY)
Admission: EM | Admit: 2023-09-09 | Discharge: 2023-09-09 | Disposition: A | Discharge status: Home / Self Care | Attending: Emergency Medicine | Admitting: Emergency Medicine

## 2023-09-09 ENCOUNTER — Emergency Department (HOSPITAL_COMMUNITY)

## 2023-09-09 DIAGNOSIS — I10 Essential (primary) hypertension: Secondary | ICD-10-CM | POA: Diagnosis not present

## 2023-09-09 DIAGNOSIS — Z7982 Long term (current) use of aspirin: Secondary | ICD-10-CM | POA: Insufficient documentation

## 2023-09-09 DIAGNOSIS — M5441 Lumbago with sciatica, right side: Secondary | ICD-10-CM | POA: Diagnosis not present

## 2023-09-09 DIAGNOSIS — Z79899 Other long term (current) drug therapy: Secondary | ICD-10-CM | POA: Insufficient documentation

## 2023-09-09 DIAGNOSIS — M5442 Lumbago with sciatica, left side: Secondary | ICD-10-CM | POA: Diagnosis not present

## 2023-09-09 DIAGNOSIS — M545 Low back pain, unspecified: Secondary | ICD-10-CM | POA: Diagnosis present

## 2023-09-09 LAB — CBC WITH DIFFERENTIAL/PLATELET
Abs Immature Granulocytes: 0.03 10*3/uL (ref 0.00–0.07)
Basophils Absolute: 0 10*3/uL (ref 0.0–0.1)
Basophils Relative: 0 %
Eosinophils Absolute: 0.1 10*3/uL (ref 0.0–0.5)
Eosinophils Relative: 1 %
HCT: 34.8 % — ABNORMAL LOW (ref 36.0–46.0)
Hemoglobin: 12.5 g/dL (ref 12.0–15.0)
Immature Granulocytes: 0 %
Lymphocytes Relative: 38 %
Lymphs Abs: 4.1 10*3/uL — ABNORMAL HIGH (ref 0.7–4.0)
MCH: 26.8 pg (ref 26.0–34.0)
MCHC: 35.9 g/dL (ref 30.0–36.0)
MCV: 74.7 fL — ABNORMAL LOW (ref 80.0–100.0)
Monocytes Absolute: 0.6 10*3/uL (ref 0.1–1.0)
Monocytes Relative: 6 %
Neutro Abs: 5.9 10*3/uL (ref 1.7–7.7)
Neutrophils Relative %: 55 %
Platelets: 364 10*3/uL (ref 150–400)
RBC: 4.66 MIL/uL (ref 3.87–5.11)
RDW: 16 % — ABNORMAL HIGH (ref 11.5–15.5)
WBC: 10.6 10*3/uL — ABNORMAL HIGH (ref 4.0–10.5)
nRBC: 0 % (ref 0.0–0.2)

## 2023-09-09 LAB — URINALYSIS, ROUTINE W REFLEX MICROSCOPIC
Bilirubin Urine: NEGATIVE
Glucose, UA: NEGATIVE mg/dL
Hgb urine dipstick: NEGATIVE
Ketones, ur: NEGATIVE mg/dL
Leukocytes,Ua: NEGATIVE
Nitrite: NEGATIVE
Protein, ur: NEGATIVE mg/dL
Specific Gravity, Urine: 1.021 (ref 1.005–1.030)
pH: 6 (ref 5.0–8.0)

## 2023-09-09 LAB — BASIC METABOLIC PANEL WITH GFR
Anion gap: 8 (ref 5–15)
BUN: 12 mg/dL (ref 6–20)
CO2: 20 mmol/L — ABNORMAL LOW (ref 22–32)
Calcium: 9 mg/dL (ref 8.9–10.3)
Chloride: 110 mmol/L (ref 98–111)
Creatinine, Ser: 0.4 mg/dL — ABNORMAL LOW (ref 0.44–1.00)
GFR, Estimated: >60 mL/min (ref >60)
Glucose, Bld: 102 mg/dL — ABNORMAL HIGH (ref 70–99)
Potassium: 3.1 mmol/L — ABNORMAL LOW (ref 3.5–5.1)
Sodium: 138 mmol/L (ref 135–145)

## 2023-09-09 MED ORDER — OXYCODONE-ACETAMINOPHEN 5-325 MG PO TABS: 1.0000 | ORAL_TABLET | ORAL | 0 refills | Status: DC | PRN

## 2023-09-09 MED ORDER — POTASSIUM CHLORIDE CRYS ER 20 MEQ PO TBCR
40.0000 meq | EXTENDED_RELEASE_TABLET | Freq: Once | ORAL | Status: AC
  Administered 2023-09-09: 40 meq via ORAL
  Filled 2023-09-09: qty 2

## 2023-09-09 MED ORDER — HYDROMORPHONE HCL 1 MG/ML IJ SOLN
0.5000 mg | Freq: Once | INTRAMUSCULAR | Status: AC
  Administered 2023-09-09: 0.5 mg via INTRAVENOUS
  Filled 2023-09-09: qty 0.5

## 2023-09-09 MED ORDER — ONDANSETRON HCL 4 MG/2ML IJ SOLN
4.0000 mg | Freq: Once | INTRAMUSCULAR | Status: AC
  Administered 2023-09-09: 4 mg via INTRAVENOUS
  Filled 2023-09-09: qty 2

## 2023-09-09 NOTE — Discharge Instructions (Addendum)
 Temporarily stop your hydrocodone  while taking the Percocet.  Continue muscle relaxer.  Call your pain management provider tomorrow for recheck.  Your potassium level this evening was low, you have been given potassium here, you will need to follow-up with your primary care provider to have this rechecked.

## 2023-09-09 NOTE — ED Notes (Signed)
 Pain to lower back. Decreased strength. BLE. Increased pain with movement. Difficulty bending knees in bed. + equal strength plantar/dorsi flexion and +sensation BLE

## 2023-09-09 NOTE — ED Notes (Signed)
Patient verbalizes understanding of discharge instructions, home care and follow up care. 

## 2023-09-09 NOTE — ED Triage Notes (Signed)
 Pt via [facility] c/o back pain. Pt had a spinal surgery yesterday and went to pick something up this morning and is now in severe pain that goes down back lower and down the back of her of legs. Pt states prescribed medications are not helping.

## 2023-09-09 NOTE — ED Provider Notes (Signed)
 [place] EMERGENCY DEPARTMENT AT [facility] Provider Note   CSN: [id number] Arrival date & time: 09/09/23  2049     Patient presents with: Post-op Problem and Back Pain   Angel Price is a 58 y.o. female.    Back Pain Associated symptoms: no abdominal pain, no chest pain, no dysuria, no fever, no numbness and no weakness        Angel Price is a 58 y.o. female past medical history of lupus, anxiety, anemia, hypertension, who presents to the Emergency Department complaining of mid lower back pain.  She underwent spinal injection yesterday in [place].  She bent over to pick something up from the floor this evening and felt sharp pain across her lower back and into her buttocks and down her legs.  She did not fall. She describes sharp stabbing type pains radiating to the level of her mid calf bilaterally.  She denies any numbness or weakness of her extremities, urine or bowel changes, abdominal pain, fever or chills.  She states she has been urinating today without difficulty and she had a bowel movement earlier today also without difficulty.  No saddle anesthesias.  She is currently taking hydrocodone  for her pain but is not helping.  Prior to Admission medications   Medication Sig Start Date End Date Taking? Authorizing Provider  amLODipine  (NORVASC ) 10 MG tablet Take 10 mg by mouth daily. 05/18/21   [provider]  aspirin  EC 81 MG EC tablet Take 1 tablet (81 mg total) by mouth daily. 05/19/19   Elgergawy, Brayton RAMAN, MD  Cholecalciferol  50 MCG (2000 UT) CAPS Take 1 tablet by mouth daily.    [provider]  cyclobenzaprine  (FLEXERIL ) 5 MG tablet Take 1 tablet (5 mg total) by mouth 2 (two) times daily as needed for muscle spasms. 07/12/23   Small, Brooke L, PA  DULoxetine  (CYMBALTA ) 30 MG capsule Take 30 mg by mouth 2 (two) times daily. 08/14/21   [provider]  gabapentin  (NEURONTIN ) 100 MG capsule Take 100 mg by mouth 3 (three) times daily.  03/03/23   [provider]  HYDROcodone -acetaminophen  (NORCO/VICODIN) 5-325 MG tablet Take one tab po q 4 hrs prn pain Patient taking differently: Take 1 tablet by mouth every 4 (four) hours as needed for moderate pain (pain score 4-6). Take one tab po q 4 hrs prn pain 07/30/23   Kashia Brossard, PA-C  hydroxychloroquine  (PLAQUENIL ) 200 MG tablet Take 1.5 tablets (300 mg total) by mouth daily. 05/05/23   Rice, Lonni ORN, MD  ibuprofen  (ADVIL ) 800 MG tablet Take 800 mg by mouth 2 (two) times daily as needed for mild pain (pain score 1-3). 08/20/23   [provider]  metoprolol  tartrate (LOPRESSOR ) 50 MG tablet Take 50 mg by mouth 2 (two) times daily. 01/06/20   [provider]  mirtazapine  (REMERON ) 15 MG tablet Take 15 mg by mouth at bedtime as needed (for sleep). 08/10/21   [provider]  nitroGLYCERIN  (NITROSTAT ) 0.4 MG SL tablet Place 1 tablet (0.4 mg total) under the tongue every 5 (five) minutes as needed for chest pain. 12/29/19 08/21/23  Elmira Newman PARAS, MD  ondansetron  (ZOFRAN -ODT) 4 MG disintegrating tablet Take 4 mg by mouth every 4 (four) hours as needed for nausea or vomiting. 07/14/23   [provider]  pantoprazole  (PROTONIX ) 40 MG tablet Take 1 tablet (40 mg total) by mouth daily before breakfast. 08/05/23   Ezzard Sonny RAMAN, PA-C  Rimegepant Sulfate  (NURTEC) 75 MG TBDP  Take 75 mg by mouth as needed. Patient taking differently: Take 75 mg by mouth as needed (migraines). 09/12/21   Rush Nest, MD  rosuvastatin  (CRESTOR ) 20 MG tablet Take 1 tablet (20 mg total) by mouth daily. 06/25/19 11/06/28  Patwardhan, Newman PARAS, MD  thiamine  (VITAMIN B1) 100 MG tablet Take 100 mg by mouth daily. 04/26/23   [provider]    Allergies: Metoclopramide  and Codeine    Review of Systems  Constitutional:  Negative for appetite change, chills and fever.  Respiratory:  Negative for shortness of breath.   Cardiovascular:  Negative for chest pain.   Gastrointestinal:  Negative for abdominal pain, diarrhea, nausea and vomiting.  Genitourinary:  Negative for decreased urine volume, difficulty urinating, dysuria and flank pain.  Musculoskeletal:  Positive for back pain.  Skin:  Negative for color change and rash.  Neurological:  Negative for weakness and numbness.    Updated Vital Signs Temp 98.5 F (36.9 C) (Oral)   LMP  (LMP Unknown)   Physical Exam Vitals and nursing note reviewed.  Constitutional:      General: She is not in acute distress.    Appearance: Normal appearance. She is not toxic-appearing.   Cardiovascular:     Rate and Rhythm: Normal rate and regular rhythm.     Pulses: Normal pulses.  Pulmonary:     Effort: Pulmonary effort is normal.  Abdominal:     Palpations: Abdomen is soft.     Tenderness: There is no abdominal tenderness.   Musculoskeletal:        General: Tenderness and signs of injury present.     Lumbar back: Tenderness present. Decreased range of motion. Positive right straight leg raise test and positive left straight leg raise test.     Comments: Diffuse tenderness to palpation midline lower spine.  Do not appreciate any bony step-offs.  Positive straight leg raise bilaterally.    Skin:    General: Skin is warm.     Capillary Refill: Capillary refill takes less than 2 seconds.     Findings: No erythema.   Neurological:     General: No focal deficit present.     Mental Status: She is alert.     Sensory: No sensory deficit.     Motor: No weakness.     (all labs ordered are listed, but only abnormal results are displayed) Labs Reviewed  CBC WITH DIFFERENTIAL/PLATELET - Abnormal; Notable for the following components:      Result Value   WBC 10.6 (*)    HCT 34.8 (*)    MCV 74.7 (*)    RDW 16.0 (*)    Lymphs Abs 4.1 (*)    All other components within normal limits  BASIC METABOLIC PANEL WITH GFR - Abnormal; Notable for the following components:   Potassium 3.1 (*)    CO2 20 (*)     Glucose, Bld 102 (*)    Creatinine, Ser 0.40 (*)    All other components within normal limits  URINALYSIS, ROUTINE W REFLEX MICROSCOPIC    EKG: None  Radiology: CT Lumbar Spine Wo Contrast Result Date: 09/09/2023 CLINICAL DATA:  Spinal injection yesterday, now complains of severe back pain with lower extremity radiation after trying to pick up something this morning. EXAM: CT LUMBAR SPINE WITHOUT CONTRAST TECHNIQUE: Multidetector CT imaging of the lumbar spine was performed without intravenous contrast administration. Multiplanar CT image reconstructions were also generated. RADIATION DOSE REDUCTION: This exam was performed according to the departmental dose-optimization program which  includes automated exposure control, adjustment of the mA and/or kV according to patient size and/or use of iterative reconstruction technique. COMPARISON:  Large number of prior CTs of the abdomen and pelvis on file. The most recent is CTA chest, abdomen and pelvis and reconstructions 07/12/2023. FINDINGS: Segmentation: Standard. Alignment: Chronic grade 1 L4-5 anterolisthesis is seen again, due to advanced L4-5 facet hypertrophy. Alignment is otherwise normal. Vertebrae: There is no evidence of fractures or focal pathologic process. The vertebrae are normal in heights and appear normally mineralized. 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 Stable 1.7 cm left adrenal adenoma, Hounsfield density is -5. There is patchy aortoiliac atherosclerosis without AAA. There is no paraspinal mass, acute inflammatory change or fluid collections. Disc levels: Lumbar discs are normal in heights except for mild disc space loss again noted at L4-5. There is mild lumbar spondylosis but no bulky bridging osteophytes. T10-11: This disc is mildly degenerated. There is a mild diffuse disc bulge without herniation or stenosis. T11-12: There is mild disc degeneration without bulge,  herniation or stenosis. T12-L1, L1-L2: No bulge, herniation or stenosis. Beginning trace facet spurring L1-2 but the foramina are clear. L2-3: Mild diffuse annular bulge. No herniation or stenosis. Minimal facet spurring but the foramina are clear. L3-4: Due to a mild diffuse disc bulge, ligamentous thickening and moderate facet hypertrophy, there is mild spinal canal stenosis and partial effacement of the subarticular zones. There is mild foraminal stenosis which is mostly due to inferior foraminal disc bulging. L4-5: Due to a broad-based posterior disc bulge, hypertrophic ligaments and advanced facet hypertrophy, there is mild-to-moderate spinal canal stenosis and subarticular zone effacement. There is moderate left and mild right foraminal stenosis, partly due to facet osteophytes, in part due to foraminal disc bulging. L5-S1: Slight posterior disc bulge again noted. No herniation or stenosis. Other: Both SI joints are patent, both showing mild spurring. The visualized sacrum is intact. Compare: Comparison to prior study reveals no significant interval change. IMPRESSION: 1. No evidence of fractures or acute traumatic malalignment. 2. Chronic grade 1 L4-5 anterolisthesis due to advanced facet hypertrophy. 3. Degenerative changes and disc bulges as described above, with mild-to-moderate spinal canal stenosis at L4-5 and mild spinal canal stenosis at L3-4. 4. Multilevel foraminal stenosis, greatest on the left at L4-5. 5. Aortic atherosclerosis. 6. Stable 1.7 cm left adrenal adenoma. Probable right adrenalectomy. Aortic Atherosclerosis (ICD10-I70.0). Electronically Signed   By: Francis Quam M.D.   On: 09/09/2023 22:51     Procedures   Medications Ordered in the ED  ondansetron  (ZOFRAN ) injection 4 mg (has no administration in time range)  HYDROmorphone  (DILAUDID ) injection 0.5 mg (has no administration in time range)                                    Medical Decision Making Patient here with low  back pain after having a spinal injection yesterday.  But over to pick up something on the floor felt sharp pain in her back earlier today.  Describes pain it is midline to her lower spine and sharp pains radiating down both legs to the level of her calf.  She denies any abdominal pain, urine or bowel changes, numbness or weakness.  I suspect pain is related to aggravated sciatica.  Cauda equina also considered as well as infectious process, spinal abscess also considered but felt less likely.  No red  flags on exam to suggest cauda equina.  She does not have any saddle anesthesias.  Amount and/or Complexity of Data Reviewed Labs: ordered.    Details: Labs no significant  leukocytosis.  She does have mild hypokalemia.  Urinalysis without evidence of infection Radiology: ordered.    Details: CT lumbar spine without acute fracture or traumatic malalignment.  She has chronic grade 1 anterolisthesis of L4-5.  She has mild to moderate spinal canal stenosis at 4 and 5 as well Discussion of management or test interpretation with external provider(s): Symptoms felt to be related to aggravated sciatica.  No trauma.  No evidence of cauda equina on exam.  No indication for infection.  Short course of Percocet prescribed, database was reviewed.  She will discontinue her hydrocodone .  She has muscle relaxers at home. She had mild hypokalemia this evening, she was given oral dose of potassium here.  I recommended close outpatient follow-up with PCP to have potassium level rechecked in 1 week.  Risk Prescription drug management.        Final diagnoses:  Acute midline low back pain with bilateral sciatica    ED Discharge Orders     None          Herlinda Madelin RIGGERS 09/09/23 [id number]    Melvenia Motto, MD 09/09/23 [phone]

## 2023-09-16 ENCOUNTER — Encounter (HOSPITAL_COMMUNITY)

## 2023-09-16 ENCOUNTER — Encounter (HOSPITAL_COMMUNITY): Payer: Self-pay

## 2023-09-22 ENCOUNTER — Encounter (HOSPITAL_COMMUNITY): Admitting: Physical Therapy

## 2023-09-24 ENCOUNTER — Encounter (HOSPITAL_COMMUNITY): Payer: Self-pay

## 2023-09-24 ENCOUNTER — Ambulatory Visit (HOSPITAL_COMMUNITY): Attending: Nurse Practitioner

## 2023-09-24 DIAGNOSIS — M545 Low back pain, unspecified: Secondary | ICD-10-CM | POA: Diagnosis present

## 2023-09-24 DIAGNOSIS — R262 Difficulty in walking, not elsewhere classified: Secondary | ICD-10-CM | POA: Insufficient documentation

## 2023-09-24 DIAGNOSIS — M5416 Radiculopathy, lumbar region: Secondary | ICD-10-CM | POA: Insufficient documentation

## 2023-09-24 NOTE — Therapy (Signed)
 OUTPATIENT PHYSICAL THERAPY THORACOLUMBAR EVALUATION   Patient Name: Angel Price MRN: [id number] DOB:1966-03-05, 58 y.o., female Today's Date: 09/24/2023  END OF SESSION:  PT End of Session - 09/24/23 0806     Visit Number 2    Number of Visits 8    Date for PT Re-Evaluation 10/10/23    Authorization Type [employer]    Authorization Time Period please check auth    Progress Note Due on Visit 8    PT Start Time 0806    PT Stop Time 0845    PT Time Calculation (min) 39 min    Activity Tolerance Patient tolerated treatment well;Patient limited by pain    Behavior During Therapy [facility] for tasks assessed/performed          09/02/23 1020  PT Visits / Re-Eval  Visit Number 1  Number of Visits 8  Date for PT Re-Evaluation 10/10/23  Authorization  Authorization Type [employer] Medicare  Authorization Time Period please check auth  Progress Note Due on Visit 8  PT Time Calculation  PT Start Time 1020  PT Stop Time 1055  PT Time Calculation (min) 35 min  PT - End of Session  Activity Tolerance Patient tolerated treatment well;Patient limited by pain  Behavior During Therapy [facility] for tasks assessed/performed     Past Medical History:  Diagnosis Date   Abscess    soft tissue   Adrenal mass (HCC)    Alcohol abuse    Anemia    Anxiety    Blood transfusion without reported diagnosis    Chronic abdominal pain    Chronic wound infection of abdomen    Colon polyp    colonoscopy 04/2014   Depression    Discoid lupus erythematosus    Diverticulosis    colonoscopy 04/2014 moderat pan colonic   Drug-seeking behavior    Gastritis    EGD 05/2014   Gastroparesis 01/2014   GERD (gastroesophageal reflux disease)    Hemorrhoid    internal large   Hiatal hernia    History of Billroth II operation    Hypertension    Lung nodule    CT 02/2014 needs repeat 1 month   Lung nodule < 6cm on CT 04/25/2014   Lupus    Malingering    Migraine    Nausea and vomiting    chronic, recurrent    Pancreatitis    Schatzki's ring    patent per EGD 04/2014   Sickle cell trait (HCC)    Stroke (HCC)    Suicide attempt (HCC)    Thyroid  disease 2000   overactive, radiation   Past Surgical History:  Procedure Laterality Date   ABDOMINAL HYSTERECTOMY  2013   [place]   ABDOMINAL HYSTERECTOMY     ABDOMINAL SURGERY     ADRENALECTOMY Right    AGILE CAPSULE N/A 01/05/2015   Procedure: AGILE CAPSULE;  Surgeon: Lamar CHRISTELLA Hollingshead, MD;  [facility];  Service: Endoscopy;  Laterality: N/A;  0700   BALLOON DILATION N/A 10/01/2021   Procedure: BALLOON DILATION;  Surgeon: Cindie Carlin POUR, DO;  Location: [facility];  Service: Endoscopy;  Laterality: N/A;   Billroth II procedure      [place], first 2000, 2005/2006.   bilroth 2     BIOPSY  05/20/2013   Procedure: BIOPSIES OF ASCENDING AND SIGMOID COLON;  Surgeon: Lamar CHRISTELLA Hollingshead, MD;  Location: [facility];  Service: Endoscopy;;   BIOPSY  04/26/2014   Procedure: BIOPSIES;  Surgeon: Margo CROME  Fields, MD;  Location: [facility];  Service: Endoscopy;;   BIOPSY  12/24/2018   Procedure: BIOPSY;  Surgeon: Shaaron Lamar HERO, MD;  [facility];  Service: Endoscopy;;  colon   CHOLECYSTECTOMY     COLONOSCOPY     in [place]   COLONOSCOPY WITH PROPOFOL  N/A 05/20/2013   Dr.Rourk- inadequate prep, normal appearing rectum, grossly normal colon aside from pancolonic diverticula, normal terminal ileum bx= unremarkable colonic mucosa. Due for early interval 2016.    COLONOSCOPY WITH PROPOFOL  N/A 04/26/2014   DOQ:wnmfjo ileum/one colon polyp removed/moderate pan-colonic diverticulosis/large internal hemorrhoids   COLONOSCOPY WITH PROPOFOL  N/A 12/23/2014   Dr.Rourk- minimal internal hemorrhoids, pancolonic diverticulosis   COLONOSCOPY WITH PROPOFOL  N/A 08/23/2016   Procedure: COLONOSCOPY WITH PROPOFOL ;  Surgeon: Harvey Margo CROME, MD;  [facility];  Service: Endoscopy;  Laterality: N/A;   COLONOSCOPY WITH PROPOFOL  N/A 12/24/2018    Pancolonic diverticulosis, normal TI, one 5 mm polyp in rectum (tubular adenoma). Somewhat friable hemorrhagic mucosa in area of IC valve/cecum, possibly related to scope trauma s/p biopsy. Repeat in 7 years.   DEBRIDEMENT OF ABDOMINAL WALL ABSCESS N/A 02/08/2013   Procedure: DEBRIDEMENT OF ABDOMINAL WALL ABSCESS;  Surgeon: Oneil DELENA Budge, MD;  Location: [facility];  Service: General;  Laterality: N/A;   ESOPHAGOGASTRODUODENOSCOPY (EGD) WITH PROPOFOL  N/A 05/20/2013   Dr.Rourk- s/p prior gastric surgery with normal esophagus, residual gastric mucosa and patent efferent limb   ESOPHAGOGASTRODUODENOSCOPY (EGD) WITH PROPOFOL  N/A 02/03/2014   Dr. Shaaron:  s/p hemigastrectomy with retained gastric contents. Residual gastric mucosa and efferent limb appeared normal otherwise. Query gastroparesis.    ESOPHAGOGASTRODUODENOSCOPY (EGD) WITH PROPOFOL  N/A 04/26/2014   DOQ:dryjusxp'd ring/small HH/mild non-erosive gasrtitis/normal anastomosis   ESOPHAGOGASTRODUODENOSCOPY (EGD) WITH PROPOFOL  N/A 12/23/2014   Dr.Rourk- s/p prior hemigastrctomy, active oozing from anastomotic suture site, hemostasis achieved   ESOPHAGOGASTRODUODENOSCOPY (EGD) WITH PROPOFOL  N/A 05/23/2016   Dr. 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    ESOPHAGOGASTRODUODENOSCOPY (EGD) WITH PROPOFOL  N/A 05/11/2017   Procedure: ESOPHAGOGASTRODUODENOSCOPY (EGD) WITH PROPOFOL ;  Surgeon: Shaaron Lamar HERO, MD;  [facility];  Service: Endoscopy;  Laterality: N/A;   ESOPHAGOGASTRODUODENOSCOPY (EGD) WITH PROPOFOL  N/A 06/07/2021   Procedure: ESOPHAGOGASTRODUODENOSCOPY (EGD) WITH PROPOFOL ;  Surgeon: Shaaron Lamar HERO, MD;  [facility];  Service: Endoscopy;  Laterality: N/A;  11:45am   ESOPHAGOGASTRODUODENOSCOPY (EGD) WITH PROPOFOL  N/A 10/01/2021   Procedure: ESOPHAGOGASTRODUODENOSCOPY (EGD) WITH PROPOFOL ;  Surgeon: Cindie Carlin POUR, DO;  [facility];  Service: Endoscopy;  Laterality: N/A;  3:00pm   EXCISION OF MESH  08/2019   HEMORRHOID SURGERY N/A 06/18/2017   Procedure: THREE COLUMN EXTENSIVE HEMORRHOIDECTOMY;  Surgeon: Kallie Manuelita BROCKS, MD;  Location: [facility];  Service: General;  Laterality: N/A;   HERNIA REPAIR     INCISION AND DRAINAGE ABSCESS N/A 01/06/2017   Procedure: INCISION AND DRAINAGE ABDOMINAL WALL ABSCESS;  Surgeon: Budge Oneil, MD;  Location: [facility];  Service: General;  Laterality: N/A;   incisional hernial repair  09/05/2021   [place]   LEFT HEART CATH AND CORONARY ANGIOGRAPHY N/A 05/09/2020   Procedure: LEFT HEART CATH AND CORONARY ANGIOGRAPHY;  Surgeon: Elmira Newman PARAS, MD;  [facility]: [facility];  Service: Cardiovascular;  Laterality: N/A;   LEFT HEART CATH AND CORONARY ANGIOGRAPHY N/A 08/22/2023   Procedure: LEFT HEART CATH AND CORONARY ANGIOGRAPHY;  Surgeon: Verlin Lonni BIRCH, MD;  Location: [facility];  Service: Cardiovascular;  Laterality: N/A;   POLYPECTOMY  12/24/2018   Procedure: POLYPECTOMY;  Surgeon: Shaaron Lamar HERO, MD;  [facility];  Service: Endoscopy;;  colon   tendon repar Right    wrist   WOUND EXPLORATION Right 06/24/2013   Procedure: exploration of traumatic wound right wrist;  Surgeon: Franky JONELLE Curia, MD;  [facility];  Service: Orthopedics;  Laterality: Right;   Patient Active Problem List   Diagnosis Date Noted   Chest pain 08/21/2023   Dyslipidemia 08/21/2023   Rectal pressure 08/05/2023   Abdominal bloating 08/05/2023   Anemia 08/05/2023   High risk medication use 05/05/2023   Unilateral primary osteoarthritis, left knee 05/05/2023   Seropositive rheumatoid arthritis (HCC) 05/02/2022   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   Cellulitis 10/06/2021   Paroxysmal atrial fibrillation (HCC) 10/06/2021   Abdominal fluid collection 10/06/2021   Constipation 09/14/2021   Ventral hernia 05/15/2021   Dysphagia 05/15/2021   Tobacco  dependence 09/12/2020   Continuous dependence on cigarette smoking 08/22/2020   Mixed hyperlipidemia 04/27/2020   Recurrent incisional hernia 02/03/2020   Bilateral knee pain 12/29/2019   Coronary artery disease of native artery of native heart with stable angina pectoris (HCC) 06/25/2019   Acute respiratory failure with hypoxia (HCC) 05/20/2019   Essential hypertension 05/20/2019   COVID-19 05/20/2019   Syncope and collapse 05/20/2019   Metabolic acidosis 05/16/2019   Intractable vomiting with nausea 02/23/2019   Cocaine abuse (HCC)    Colitis 11/11/2018   Hemorrhoidal skin tag    Folate deficiency 05/11/2017   Abdominal wall fistula    Cellulitis of abdominal wall 01/02/2017   Acute left hemiparesis (HCC) 12/05/2016   Malingering 12/05/2016   Weakness of left lower extremity 12/02/2016   CVA (cerebral vascular accident) (HCC) 11/23/2016   Bright red rectal bleeding 08/22/2016   Hypotension due to blood loss    Acute GI bleeding 05/24/2016   Dieulafoy lesion of duodenum    History of Billroth II operation    Gastrointestinal hemorrhage 05/22/2016   Absolute anemia    Acute pancreatitis 10/01/2015   Alcohol abuse with intoxication (HCC)    Left-sided weakness    Psychosomatic factor in physical condition    Upper GI bleed    Diverticulosis of colon with hemorrhage    Chronic wound infection of abdomen 12/22/2014   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   HTN (hypertension) 12/22/2014   Ataxia 11/01/2014   Hemorrhoids 04/26/2014   Lung nodule 04/26/2014   Diverticulosis    Gastritis    Hiatal hernia    Schatzki's ring    Acute blood loss anemia 04/25/2014   Hypokalemia 04/25/2014   Hematemesis with nausea 04/25/2014   Lung nodule < 6cm on CT 04/25/2014   Intractable nausea and vomiting 04/24/2014   Gastroparesis    Abdominal pain 04/01/2014   Gastroenteritis 12/10/2013   Chronic abdominal wound infection 08/16/2013   MDD (major depressive disorder), recurrent episode,  severe (HCC) 06/27/2013   Wrist laceration 06/24/2013   Diarrhea 05/07/2013   Rectal bleeding 05/07/2013   Adrenal mass, left (HCC) 05/07/2013   Post-operative wound abscess 04/19/2013   Abdominal wall abscess 04/18/2013   Essential hypertension, benign 03/30/2013   History of cocaine abuse (HCC) 03/16/2013   History of schizoaffective disorder 03/16/2013   Bipolar disorder (HCC) 03/16/2013   Personality disorder (HCC) 03/16/2013   Tobacco use disorder 03/16/2013   Alcohol abuse 03/16/2013   Palpitations 03/16/2013   Poor vision 03/16/2013   History of gastric bypass 03/16/2013  Status post hysterectomy with oophorectomy 03/16/2013   Hypothyroid 03/16/2013   SLE (systemic lupus erythematosus) (HCC) 03/16/2013    PCP: Dr. Carlette  REFERRING PROVIDER: Daris Merlynn BRAVO, NP  REFERRING DIAG: Diagnosis M54.16 (ICD-10-CM) - Radiculopathy, lumbar region G89.4 (ICD-10-CM) - Chronic pain syndrome M47.816 (ICD-10-CM) - Spondylosis without myelopathy or radiculopathy, lumbar region  Rationale for Evaluation and Treatment: Rehabilitation  THERAPY DIAG:  Low back pain, unspecified back pain laterality, unspecified chronicity, unspecified whether sciatica present  Difficulty in walking, not elsewhere classified  Radiculopathy, lumbar region  ONSET DATE: about 6 months or so  SUBJECTIVE:                                                                                                                                                                                           SUBJECTIVE STATEMENT: Pt states she is feeling so so, she went down to pick something up and could not get herself up. Ambulance came and took her to the hospital. Pt reports some HEP compliance. Pt reports back pain is about a 6/10 at this time.   Eval: Back pain maybe 6 months or so; insidious onset; constant pain; worse with prolonged sitting and with prolonged walking; back pain that goes down into the legs;  originally just left now it's both; having an epidural injection 09/08/23  PERTINENT HISTORY:  Lupus afib Pt reports having 5 strokes first being 2017 and 3 in March (TIAs) PAIN:  Are you having pain? Yes: NPRS scale: 4-10/10 Pain location: low back down both legs to ankle Pain description: aching Aggravating factors: prolonged standing, sitting Relieving factors: pain medication, heat, ice  PRECAUTIONS: None    WEIGHT BEARING RESTRICTIONS: No  FALLS:  Has patient fallen in last 6 months? No   OCCUPATION: not currently working  PLOF: Independent  PATIENT GOALS: not to flex forward when walking  NEXT MD VISIT: 09/08/23 having an epidural injection  OBJECTIVE:  Note: Objective measures were completed at Evaluation unless otherwise noted.  DIAGNOSTIC FINDINGS:  IMPRESSION: Normal right hip radiographs.  L4-L5 mild degenerative changes per xray; clips in abdomen from multiple abdominal surgery  PATIENT SURVEYS:  Modified Oswestry 34/50 68%   COGNITION: Overall cognitive status: Within functional limits for tasks assessed     SENSATION: WFL Will occasionally get some numbness and tingling in left hand  MUSCLE LENGTH: Hamstrings: Right  deg; Left  deg POSTURE: rounded shoulders, forward head, and flexed trunk   PALPATION: Tender L3 to S1 paraspinals  LUMBAR ROM: *painful  AROM eval  Flexion Fingertips to top of patella*  Extension Unable to fully extend to neutral*  Right lateral flexion  Fingertips 1 to righ  Left lateral flexion Fingertips 2 to left  Right rotation   Left rotation    (Blank rows = not tested)  LOWER EXTREMITY ROM:     Active  Right eval Left eval  Hip flexion    Hip extension    Hip abduction    Hip adduction    Hip internal rotation    Hip external rotation    Knee flexion    Knee extension    Ankle dorsiflexion    Ankle plantarflexion    Ankle inversion    Ankle eversion     (Blank rows = not tested)  LOWER EXTREMITY  MMT:    MMT Right eval Left eval  Hip flexion 4+ 4*  Hip extension 3* 3-*  Hip abduction    Hip adduction    Hip internal rotation    Hip external rotation    Knee flexion 4+ 3+*  Knee extension 5 4+  Ankle dorsiflexion 5 4+  Ankle plantarflexion    Ankle inversion    Ankle eversion     (Blank rows = not tested)  FUNCTIONAL TESTS:  5 times sit to stand: 28.79 SLS unable either side  GAIT: Distance walked: 50 ft in clinic Assistive device utilized: None Level of assistance: Modified independence Comments: antalgic slow gait  TREATMENT DATE:  09/24/2023  Therapeutic Exercise: -Supine bridges, 1 sets of 10 reps, 3 second holds, symptomatic, pt cued for max hip extension -Standing 3 way hip 1 sets 5 reps, bilaterally, pt cued for upright trunk and maintaining of neutral spine, left side better performance -Lateral stepping 3 laps 20 feet per lap, with GTB around ankles, pt cued for upright posture -Monster walks with GTB at ankles, 3 laps, 20 feet per lap, pt cued for controlled movement -DKTC on green exercise ball, 2 sets of 10 reps, pt cued for pain free ROM   Neuromuscular Re-education: -PPT 1 set of 10 reps, 3 second holds, pt cued to remain in pain free ROM -LTR 2 set of 10 reps bilaterally, pt cued to remain in pain free ROM, restricted ROM observed to the right  Therapeutic Activity: -Sit to stands, 2 sets of 10 reps, pt cued for core activation   09/02/23 physical therapy evaluation and HEP instruction                                                                                                                                 PATIENT EDUCATION:  Education details: Patient educated on exam findings, POC, scope of PT, HEP. Person educated: Patient Education method: Explanation, Demonstration, and Handouts Education comprehension: verbalized understanding, returned demonstration, verbal cues required, and tactile cues required   HOME EXERCISE PROGRAM: Access  Code: [email or web address] URL: [email or web address] Date: 09/02/2023 Prepared by: [facility] - Rehab  Exercises - Supine Lower Trunk Rotation  - 2 x daily - 7 x weekly - 1 sets - 10 reps - 5 sec  hold - Hooklying Single Knee to Chest Stretch with Towel  - 2 x daily - 7 x weekly - 1 sets - 5 reps - 10 sec hold - Supine Transversus Abdominis Bracing - Hands on Stomach  - 2 x daily - 7 x weekly - 1 sets - 10 reps - 5 sec hold Access Code: [email or web address] URL: [email or web address] Date: 09/24/2023 Prepared by: Lang Ada  Exercises - Supine Bridge  - 1 x daily - 7 x weekly - 3 sets - 10 reps - Standing 3-way kick, with counter support  - 1 x daily - 7 x weekly - 3 sets - 10 reps ASSESSMENT:  CLINICAL IMPRESSION: Patient continues to demonstrate increased low back pain, decreased RLE strength, decreased gait quality and balance. Patient also demonstrates decreased endurance with exercises today with need for multiple rest breaks due to RLE fatigue and pain. Patient able to progress dynamic balance and core activation exercises today with supine bridges and banded walks, good performance with verbal cueing. Patient would continue to benefit from skilled physical therapy for decreased low back pain, increased endurance with ambulation, increased RLE strength, and improved balance for improved quality of life, improved independence with community ambulation and continued progress towards therapy goals.   Patient is a 58 y.o. female who was seen today for physical therapy evaluation and treatment for Diagnosis M54.16 (ICD-10-CM) - Radiculopathy, lumbar region G89.4 (ICD-10-CM) - Chronic pain syndrome M47.816 (ICD-10-CM) - Spondylosis without myelopathy or radiculopathy, lumbar region.  Patient demonstrates muscle weakness, reduced ROM, and fascial restrictions which are likely contributing to symptoms of pain and are negatively impacting patient ability to perform ADLs and functional mobility  tasks. Patient will benefit from skilled physical therapy services to address these deficits to reduce pain and improve level of function with ADLs and functional mobility tasks.   OBJECTIVE IMPAIRMENTS: Abnormal gait, decreased activity tolerance, decreased balance, decreased mobility, difficulty walking, decreased ROM, decreased strength, increased fascial restrictions, impaired perceived functional ability, and pain.   ACTIVITY LIMITATIONS: carrying, lifting, bending, sitting, standing, squatting, sleeping, stairs, bed mobility, locomotion level, and caring for others  PARTICIPATION LIMITATIONS: meal prep, cleaning, laundry, medication management, and community activity  PERSONAL FACTORS: 1 comorbidity: lupus are also affecting patient's functional outcome.   REHAB POTENTIAL: Good  CLINICAL DECISION MAKING: Evolving/moderate complexity  EVALUATION COMPLEXITY: Moderate   GOALS: Goals reviewed with patient? No  SHORT TERM GOALS: Target date: 09/23/2023  patient will be independent with initial HEP   Baseline: Goal status: INITIAL  2.  Patient will report 50% improvement overall  Baseline:  Goal status: INITIAL   LONG TERM GOALS: Target date: 10/10/2023  Patient will be independent in self management strategies to improve quality of life and functional outcomes.  Baseline:  Goal status: INITIAL  2.  Patient will report 75% improvement overall  Baseline:  Goal status: INITIAL  3.  Patient will able to stand SLS on left leg x 10 to demonstrate improved functional balance  Baseline: unable each side at eval Goal status: INITIAL  4.  Patient will improve 5 times sit to stand score to 20 sec or less to demonstrate improved functional mobility and increased leg strength.    Baseline: 28.79 Goal status: INITIAL  5.   Patient will increase bilateral leg MMT's to 4+ to 5/5 to allow navigation of steps without gait deviation or loss of balance  Baseline: see  above Goal status: INITIAL  6.  Patient will improve Modified Oswestry score by 14 points to demonstrate  improved perceived function  Baseline: 34/50 at eval Goal status: INITIAL  PLAN:  PT FREQUENCY: 2x/week  PT DURATION: 4 weeks  PLANNED INTERVENTIONS:[id number]- PT Re-evaluation, 97110-Therapeutic exercises, 97530- Therapeutic activity, [id number]- Neuromuscular re-education, 97535- Self Care, [id number]- Manual therapy, [id number]- Gait training, [phone]- Orthotic Fit/training, [phone]- Canalith repositioning, [id number]- Aquatic Therapy, [phone]- Splinting, [phone]- Wound care (first 20 sq cm), 97598- Wound care (each additional 20 sq cm)Patient/Family education, Balance training, Stair training, Taping, Dry Needling, Joint mobilization, Joint manipulation, Spinal manipulation, Spinal mobilization, Scar mobilization, and DME instructions.  PLAN FOR NEXT SESSION: Review HEP and goals; will miss next week due to having an epidural.  Lumbar mobility and core strengthening.     Lang Ada, PT, DPT [facility] Office: [phone] 8:47 AM, 09/24/23

## 2023-10-01 ENCOUNTER — Encounter (HOSPITAL_COMMUNITY)

## 2023-10-01 ENCOUNTER — Encounter (HOSPITAL_COMMUNITY): Payer: Self-pay

## 2023-10-03 ENCOUNTER — Encounter (HOSPITAL_COMMUNITY): Payer: Self-pay

## 2023-10-03 ENCOUNTER — Encounter (HOSPITAL_COMMUNITY)

## 2023-10-07 ENCOUNTER — Ambulatory Visit (HOSPITAL_COMMUNITY): Admitting: Physical Therapy

## 2023-10-07 DIAGNOSIS — M545 Low back pain, unspecified: Secondary | ICD-10-CM | POA: Diagnosis not present

## 2023-10-07 DIAGNOSIS — R262 Difficulty in walking, not elsewhere classified: Secondary | ICD-10-CM

## 2023-10-07 DIAGNOSIS — M5416 Radiculopathy, lumbar region: Secondary | ICD-10-CM

## 2023-10-07 NOTE — Therapy (Signed)
 OUTPATIENT PHYSICAL THERAPY THORACOLUMBAR TREATMENT   Patient Name: Angel Price MRN: [id number] DOB:09/14/1965, 58 y.o., female Today's Date: 10/07/2023  END OF SESSION:  PT End of Session - 10/07/23 0809     Visit Number 3    Number of Visits 8    Date for PT Re-Evaluation 10/10/23    Authorization Type [employer]    Authorization Time Period 8 visits approved 6/17-7/15    Authorization - Number of Visits 3    Progress Note Due on Visit 8    PT Start Time 0806    PT Stop Time 0845    PT Time Calculation (min) 39 min    Activity Tolerance Patient tolerated treatment well;Patient limited by pain    Behavior During Therapy [facility] for tasks assessed/performed           Past Medical History:  Diagnosis Date   Abscess    soft tissue   Adrenal mass (HCC)    Alcohol abuse    Anemia    Anxiety    Blood transfusion without reported diagnosis    Chronic abdominal pain    Chronic wound infection of abdomen    Colon polyp    colonoscopy 04/2014   Depression    Discoid lupus erythematosus    Diverticulosis    colonoscopy 04/2014 moderat pan colonic   Drug-seeking behavior    Gastritis    EGD 05/2014   Gastroparesis 01/2014   GERD (gastroesophageal reflux disease)    Hemorrhoid    internal large   Hiatal hernia    History of Billroth II operation    Hypertension    Lung nodule    CT 02/2014 needs repeat 1 month   Lung nodule < 6cm on CT 04/25/2014   Lupus    Malingering    Migraine    Nausea and vomiting    chronic, recurrent   Pancreatitis    Schatzki's ring    patent per EGD 04/2014   Sickle cell trait (HCC)    Stroke (HCC)    Suicide attempt (HCC)    Thyroid  disease 2000   overactive, radiation   Past Surgical History:  Procedure Laterality Date   ABDOMINAL HYSTERECTOMY  2013   [place]   ABDOMINAL HYSTERECTOMY     ABDOMINAL SURGERY     ADRENALECTOMY Right    AGILE CAPSULE N/A 01/05/2015   Procedure: AGILE CAPSULE;  Surgeon: Lamar CHRISTELLA Hollingshead, MD;   [facility];  Service: Endoscopy;  Laterality: N/A;  0700   BALLOON DILATION N/A 10/01/2021   Procedure: BALLOON DILATION;  Surgeon: Cindie Carlin POUR, DO;  [facility];  Service: Endoscopy;  Laterality: N/A;   Billroth II procedure      [place], first 2000, 2005/2006.   bilroth 2     BIOPSY  05/20/2013   Procedure: BIOPSIES OF ASCENDING AND SIGMOID COLON;  Surgeon: Lamar CHRISTELLA Hollingshead, MD;  Location: [facility];  Service: Endoscopy;;   BIOPSY  04/26/2014   Procedure: BIOPSIES;  Surgeon: Margo LITTIE Haddock, MD;  Location: [facility];  Service: Endoscopy;;   BIOPSY  12/24/2018   Procedure: BIOPSY;  Surgeon: Hollingshead Lamar CHRISTELLA, MD;  [facility];  Service: Endoscopy;;  colon   CHOLECYSTECTOMY     COLONOSCOPY     in [place]   COLONOSCOPY WITH PROPOFOL  N/A 05/20/2013   Dr.Rourk- inadequate prep, normal appearing rectum, grossly normal colon aside from pancolonic diverticula, normal terminal ileum bx= unremarkable colonic mucosa. Due for early interval  2016.    COLONOSCOPY WITH PROPOFOL  N/A 04/26/2014   DOQ:wnmfjo ileum/one colon polyp removed/moderate pan-colonic diverticulosis/large internal hemorrhoids   COLONOSCOPY WITH PROPOFOL  N/A 12/23/2014   Dr.Rourk- minimal internal hemorrhoids, pancolonic diverticulosis   COLONOSCOPY WITH PROPOFOL  N/A 08/23/2016   Procedure: COLONOSCOPY WITH PROPOFOL ;  Surgeon: Harvey Margo CROME, MD;  [facility];  Service: Endoscopy;  Laterality: N/A;   COLONOSCOPY WITH PROPOFOL  N/A 12/24/2018   Pancolonic diverticulosis, normal TI, one 5 mm polyp in rectum (tubular adenoma). Somewhat friable hemorrhagic mucosa in area of IC valve/cecum, possibly related to scope trauma s/p biopsy. Repeat in 7 years.   DEBRIDEMENT OF ABDOMINAL WALL ABSCESS N/A 02/08/2013   Procedure: DEBRIDEMENT OF ABDOMINAL WALL ABSCESS;  Surgeon: Oneil DELENA Budge, MD;  Location: [facility];  Service: General;  Laterality: N/A;   ESOPHAGOGASTRODUODENOSCOPY (EGD) WITH PROPOFOL   N/A 05/20/2013   Dr.Rourk- s/p prior gastric surgery with normal esophagus, residual gastric mucosa and patent efferent limb   ESOPHAGOGASTRODUODENOSCOPY (EGD) WITH PROPOFOL  N/A 02/03/2014   Dr. Shaaron:  s/p hemigastrectomy with retained gastric contents. Residual gastric mucosa and efferent limb appeared normal otherwise. Query gastroparesis.    ESOPHAGOGASTRODUODENOSCOPY (EGD) WITH PROPOFOL  N/A 04/26/2014   DOQ:dryjusxp'd ring/small HH/mild non-erosive gasrtitis/normal anastomosis   ESOPHAGOGASTRODUODENOSCOPY (EGD) WITH PROPOFOL  N/A 12/23/2014   Dr.Rourk- s/p prior hemigastrctomy, active oozing from anastomotic suture site, hemostasis achieved   ESOPHAGOGASTRODUODENOSCOPY (EGD) WITH PROPOFOL  N/A 05/23/2016   Dr. 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    ESOPHAGOGASTRODUODENOSCOPY (EGD) WITH PROPOFOL  N/A 05/11/2017   Procedure: ESOPHAGOGASTRODUODENOSCOPY (EGD) WITH PROPOFOL ;  Surgeon: Shaaron Lamar HERO, MD;  [facility];  Service: Endoscopy;  Laterality: N/A;   ESOPHAGOGASTRODUODENOSCOPY (EGD) WITH PROPOFOL  N/A 06/07/2021   Procedure: ESOPHAGOGASTRODUODENOSCOPY (EGD) WITH PROPOFOL ;  Surgeon: Shaaron Lamar HERO, MD;  [facility];  Service: Endoscopy;  Laterality: N/A;  11:45am   ESOPHAGOGASTRODUODENOSCOPY (EGD) WITH PROPOFOL  N/A 10/01/2021   Procedure: ESOPHAGOGASTRODUODENOSCOPY (EGD) WITH PROPOFOL ;  Surgeon: Cindie Carlin POUR, DO;  [facility];  Service: Endoscopy;  Laterality: N/A;  3:00pm   EXCISION OF MESH  08/2019   HEMORRHOID SURGERY N/A 06/18/2017   Procedure: THREE COLUMN EXTENSIVE HEMORRHOIDECTOMY;  Surgeon: Kallie Manuelita BROCKS, MD;  Location: [facility];  Service: General;  Laterality: N/A;   HERNIA REPAIR     INCISION AND DRAINAGE ABSCESS N/A 01/06/2017   Procedure: INCISION AND DRAINAGE ABDOMINAL WALL ABSCESS;  Surgeon: Budge Oneil, MD;  Location: [facility];  Service:  General;  Laterality: N/A;   incisional hernial repair  09/05/2021   [place]   LEFT HEART CATH AND CORONARY ANGIOGRAPHY N/A 05/09/2020   Procedure: LEFT HEART CATH AND CORONARY ANGIOGRAPHY;  Surgeon: Elmira Newman PARAS, MD;  [facility]: [facility];  Service: Cardiovascular;  Laterality: N/A;   LEFT HEART CATH AND CORONARY ANGIOGRAPHY N/A 08/22/2023   Procedure: LEFT HEART CATH AND CORONARY ANGIOGRAPHY;  Surgeon: Verlin Lonni BIRCH, MD;  Location: [facility];  Service: Cardiovascular;  Laterality: N/A;   POLYPECTOMY  12/24/2018   Procedure: POLYPECTOMY;  Surgeon: Shaaron Lamar HERO, MD;  [facility];  Service: Endoscopy;;  colon   tendon repar Right    wrist   WOUND EXPLORATION Right 06/24/2013   Procedure: exploration of traumatic wound right wrist;  Surgeon: Franky JONELLE Curia, MD;  Location: [facility];  Service: Orthopedics;  Laterality: Right;   Patient Active Problem List   Diagnosis Date Noted  Chest pain 08/21/2023   Dyslipidemia 08/21/2023   Rectal pressure 08/05/2023   Abdominal bloating 08/05/2023   Anemia 08/05/2023   High risk medication use 05/05/2023   Unilateral primary osteoarthritis, left knee 05/05/2023   Seropositive rheumatoid arthritis (HCC) 05/02/2022   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   Cellulitis 10/06/2021   Paroxysmal atrial fibrillation (HCC) 10/06/2021   Abdominal fluid collection 10/06/2021   Constipation 09/14/2021   Ventral hernia 05/15/2021   Dysphagia 05/15/2021   Tobacco dependence 09/12/2020   Continuous dependence on cigarette smoking 08/22/2020   Mixed hyperlipidemia 04/27/2020   Recurrent incisional hernia 02/03/2020   Bilateral knee pain 12/29/2019   Coronary artery disease of native artery of native heart with stable angina pectoris (HCC) 06/25/2019   Acute respiratory failure with hypoxia (HCC) 05/20/2019   Essential hypertension 05/20/2019   COVID-19 05/20/2019   Syncope and collapse 05/20/2019    Metabolic acidosis 05/16/2019   Intractable vomiting with nausea 02/23/2019   Cocaine abuse (HCC)    Colitis 11/11/2018   Hemorrhoidal skin tag    Folate deficiency 05/11/2017   Abdominal wall fistula    Cellulitis of abdominal wall 01/02/2017   Acute left hemiparesis (HCC) 12/05/2016   Malingering 12/05/2016   Weakness of left lower extremity 12/02/2016   CVA (cerebral vascular accident) (HCC) 11/23/2016   Bright red rectal bleeding 08/22/2016   Hypotension due to blood loss    Acute GI bleeding 05/24/2016   Dieulafoy lesion of duodenum    History of Billroth II operation    Gastrointestinal hemorrhage 05/22/2016   Absolute anemia    Acute pancreatitis 10/01/2015   Alcohol abuse with intoxication (HCC)    Left-sided weakness    Psychosomatic factor in physical condition    Upper GI bleed    Diverticulosis of colon with hemorrhage    Chronic wound infection of abdomen 12/22/2014   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   HTN (hypertension) 12/22/2014   Ataxia 11/01/2014   Hemorrhoids 04/26/2014   Lung nodule 04/26/2014   Diverticulosis    Gastritis    Hiatal hernia    Schatzki's ring    Acute blood loss anemia 04/25/2014   Hypokalemia 04/25/2014   Hematemesis with nausea 04/25/2014   Lung nodule < 6cm on CT 04/25/2014   Intractable nausea and vomiting 04/24/2014   Gastroparesis    Abdominal pain 04/01/2014   Gastroenteritis 12/10/2013   Chronic abdominal wound infection 08/16/2013   MDD (major depressive disorder), recurrent episode, severe (HCC) 06/27/2013   Wrist laceration 06/24/2013   Diarrhea 05/07/2013   Rectal bleeding 05/07/2013   Adrenal mass, left (HCC) 05/07/2013   Post-operative wound abscess 04/19/2013   Abdominal wall abscess 04/18/2013   Essential hypertension, benign 03/30/2013   History of cocaine abuse (HCC) 03/16/2013   History of schizoaffective disorder 03/16/2013   Bipolar disorder (HCC) 03/16/2013   Personality disorder (HCC) 03/16/2013   Tobacco  use disorder 03/16/2013   Alcohol abuse 03/16/2013   Palpitations 03/16/2013   Poor vision 03/16/2013   History of gastric bypass 03/16/2013   Status post hysterectomy with oophorectomy 03/16/2013   Hypothyroid 03/16/2013   SLE (systemic lupus erythematosus) (HCC) 03/16/2013    PCP: Dr. Carlette  REFERRING PROVIDER: Brunson-Ollison, Joyce E, NP  REFERRING DIAG: Diagnosis M54.16 (ICD-10-CM) - Radiculopathy, lumbar region G89.4 (ICD-10-CM) - Chronic pain syndrome M47.816 (ICD-10-CM) - Spondylosis without myelopathy or radiculopathy, lumbar region  Rationale for Evaluation and Treatment: Rehabilitation  THERAPY DIAG:  Low back pain, unspecified back pain laterality, unspecified chronicity, unspecified whether sciatica present  Difficulty in walking, not elsewhere classified  Radiculopathy, lumbar region  ONSET DATE: about 6 months or so  SUBJECTIVE:                                                                                                                                                                                           SUBJECTIVE STATEMENT: Pt states she got the epidural last week but it did not help.  Currently pain is 6/10 in central lower back.  Completing HEP without questions.    Eval: Back pain maybe 6 months or so; insidious onset; constant pain; worse with prolonged sitting and with prolonged walking; back pain that goes down into the legs; originally just left now it's both; having an epidural injection 09/08/23  PERTINENT HISTORY:  Lupus afib Pt reports having 5 strokes first being 2017 and 3 in March (TIAs) PAIN:  Are you having pain? Yes: NPRS scale: 6/10 Pain location: low back down both legs to ankle Pain description: aching Aggravating factors: prolonged standing, sitting Relieving factors: pain medication, heat, ice  PRECAUTIONS: None    WEIGHT BEARING RESTRICTIONS: No  FALLS:  Has patient fallen in last 6 months? No   OCCUPATION: not  currently working  PLOF: Independent  PATIENT GOALS: not to flex forward when walking  NEXT MD VISIT: 09/08/23 having an epidural injection  OBJECTIVE:  Note: Objective measures were completed at Evaluation unless otherwise noted.  DIAGNOSTIC FINDINGS:  IMPRESSION: Normal right hip radiographs.  L4-L5 mild degenerative changes per xray; clips in abdomen from multiple abdominal surgery  PATIENT SURVEYS:  Modified Oswestry 34/50 68%   COGNITION: Overall cognitive status: Within functional limits for tasks assessed     SENSATION: WFL Will occasionally get some numbness and tingling in left hand  MUSCLE LENGTH: Hamstrings: Right  deg; Left  deg POSTURE: rounded shoulders, forward head, and flexed trunk   PALPATION: Tender L3 to S1 paraspinals  LUMBAR ROM: *painful  AROM eval  Flexion Fingertips to top of patella*  Extension Unable to fully extend to neutral*  Right lateral flexion Fingertips 1 to righ  Left lateral flexion Fingertips 2 to left  Right rotation   Left rotation    (Blank rows = not tested)  LOWER EXTREMITY ROM:     Active  Right eval Left eval  Hip flexion    Hip extension    Hip abduction    Hip adduction    Hip internal rotation    Hip external rotation    Knee flexion    Knee extension    Ankle dorsiflexion    Ankle plantarflexion    Ankle inversion  Ankle eversion     (Blank rows = not tested)  LOWER EXTREMITY MMT:    MMT Right eval Left eval  Hip flexion 4+ 4*  Hip extension 3* 3-*  Hip abduction    Hip adduction    Hip internal rotation    Hip external rotation    Knee flexion 4+ 3+*  Knee extension 5 4+  Ankle dorsiflexion 5 4+  Ankle plantarflexion    Ankle inversion    Ankle eversion     (Blank rows = not tested)  FUNCTIONAL TESTS:  5 times sit to stand: 28.79 SLS unable either side  GAIT: Distance walked: 50 ft in clinic Assistive device utilized: None Level of assistance: Modified independence Comments:  antalgic slow gait  TREATMENT DATE:  10/07/23 Standing:  heelraises on incline 20X  Vectors 5X3 each LE with bil UE assist  Lateral side stepping 10 ft line, GTB at ankles [employer] walks 10 ft line, GTB at ankles 3RT  Lumbar extensions 2X10 Supine:  bridge 2X10  LTR 10X  SLR 10X each Prone laying prone (45 and had to get up) Sit to stands 10X Long sitting Hamstring stretch 3X30     09/24/2023  Therapeutic Exercise: -Supine bridges, 1 sets of 10 reps, 3 second holds, symptomatic, pt cued for max hip extension -Standing 3 way hip 1 sets 5 reps, bilaterally, pt cued for upright trunk and maintaining of neutral spine, left side better performance -Lateral stepping 3 laps 20 feet per lap, with GTB around ankles, pt cued for upright posture -Monster walks with GTB at ankles, 3 laps, 20 feet per lap, pt cued for controlled movement -DKTC on green exercise ball, 2 sets of 10 reps, pt cued for pain free ROM   Neuromuscular Re-education: -PPT 1 set of 10 reps, 3 second holds, pt cued to remain in pain free ROM -LTR 2 set of 10 reps bilaterally, pt cued to remain in pain free ROM, restricted ROM observed to the right  Therapeutic Activity: -Sit to stands, 2 sets of 10 reps, pt cued for core activation   09/02/23 physical therapy evaluation and HEP instruction                                                                                                                                 PATIENT EDUCATION:  Education details: Patient educated on exam findings, POC, scope of PT, HEP. Person educated: Patient Education method: Explanation, Demonstration, and Handouts Education comprehension: verbalized understanding, returned demonstration, verbal cues required, and tactile cues required   HOME EXERCISE PROGRAM: Access Code: [email or web address] URL: [email or web address] Date: 09/02/2023 Prepared by: [facility] - Rehab Exercises - Supine Lower Trunk Rotation  - 2 x daily - 7 x  weekly - 1 sets - 10 reps - 5 sec hold - Hooklying Single Knee to Chest Stretch with Towel  - 2 x daily - 7 x weekly - 1 sets - 5 reps -  10 sec hold - Supine Transversus Abdominis Bracing - Hands on Stomach  - 2 x daily - 7 x weekly - 1 sets - 10 reps - 5 sec hold  Access Code: [email or web address] URL: [email or web address] Date: 09/24/2023 Prepared by: Lang Ada  Exercises - Supine Bridge  - 1 x daily - 7 x weekly - 3 sets - 10 reps - Standing 3-way kick, with counter support  - 1 x daily - 7 x weekly - 3 sets - 10 reps  ASSESSMENT:  CLINIC IMPRESSION: Continued with focus on improving LE strength, stabilization while reducing pain. Positive pain behaviors noted especially with vector holds and with band functional strengthening.  Attempted prone lying, however could not tolerate greater than 45 seconds and requested to get up. Pt was able to complete standing lumbar extensions in full ROM without c/o pain or issues.  Poor eccentric lowering with sit to stands.  Long seated hamstring stretch added without difficulty.  Pt did require multiple rest breaks due to fatigue and pain. Positive pain behaviors throughout session today.  Patient will continue to benefit from skilled physical therapy.  Patient is a 58 y.o. female who was seen today for physical therapy evaluation and treatment for Diagnosis M54.16 (ICD-10-CM) - Radiculopathy, lumbar region G89.4 (ICD-10-CM) - Chronic pain syndrome M47.816 (ICD-10-CM) - Spondylosis without myelopathy or radiculopathy, lumbar region.  Patient demonstrates muscle weakness, reduced ROM, and fascial restrictions which are likely contributing to symptoms of pain and are negatively impacting patient ability to perform ADLs and functional mobility tasks. Patient will benefit from skilled physical therapy services to address these deficits to reduce pain and improve level of function with ADLs and functional mobility tasks.   OBJECTIVE IMPAIRMENTS: Abnormal  gait, decreased activity tolerance, decreased balance, decreased mobility, difficulty walking, decreased ROM, decreased strength, increased fascial restrictions, impaired perceived functional ability, and pain.   ACTIVITY LIMITATIONS: carrying, lifting, bending, sitting, standing, squatting, sleeping, stairs, bed mobility, locomotion level, and caring for others  PARTICIPATION LIMITATIONS: meal prep, cleaning, laundry, medication management, and community activity  PERSONAL FACTORS: 1 comorbidity: lupus are also affecting patient's functional outcome.   REHAB POTENTIAL: Good  CLINICAL DECISION MAKING: Evolving/moderate complexity  EVALUATION COMPLEXITY: Moderate   GOALS: Goals reviewed with patient? No  SHORT TERM GOALS: Target date: 09/23/2023  patient will be independent with initial HEP   Baseline: Goal status: INITIAL  2.  Patient will report 50% improvement overall  Baseline:  Goal status: INITIAL   LONG TERM GOALS: Target date: 10/10/2023  Patient will be independent in self management strategies to improve quality of life and functional outcomes.  Baseline:  Goal status: INITIAL  2.  Patient will report 75% improvement overall  Baseline:  Goal status: INITIAL  3.  Patient will able to stand SLS on left leg x 10 to demonstrate improved functional balance  Baseline: unable each side at eval Goal status: INITIAL  4.  Patient will improve 5 times sit to stand score to 20 sec or less to demonstrate improved functional mobility and increased leg strength.    Baseline: 28.79 Goal status: INITIAL  5.   Patient will increase bilateral leg MMT's to 4+ to 5/5 to allow navigation of steps without gait deviation or loss of balance  Baseline: see above Goal status: INITIAL  6.  Patient will improve Modified Oswestry score by 14 points to demonstrate improved perceived function  Baseline: 34/50 at eval Goal status: INITIAL  PLAN:  PT FREQUENCY: 2x/week  PT  DURATION: 4 weeks  PLANNED INTERVENTIONS:[id number]- PT Re-evaluation, 97110-Therapeutic exercises, 97530- Therapeutic activity, [id number]- Neuromuscular re-education, [phone]- Self Care, [id number]- Manual therapy, [phone]- Gait training, [phone]- Orthotic Fit/training, [phone]- Canalith repositioning, [id number]- Aquatic Therapy, [phone]- Splinting, 97597- Wound care (first 20 sq cm), 97598- Wound care (each additional 20 sq cm)Patient/Family education, Balance training, Stair training, Taping, Dry Needling, Joint mobilization, Joint manipulation, Spinal manipulation, Spinal mobilization, Scar mobilization, and DME instructions.  PLAN FOR NEXT SESSION: Progress Lumbar mobility and core strengthening.     Greig KATHEE Fuse, PTA/CLT [facility] Ph: [phone] 8:25 AM, 10/07/23

## 2023-10-09 ENCOUNTER — Ambulatory Visit (HOSPITAL_COMMUNITY)

## 2023-10-09 DIAGNOSIS — M545 Low back pain, unspecified: Secondary | ICD-10-CM

## 2023-10-09 DIAGNOSIS — M5416 Radiculopathy, lumbar region: Secondary | ICD-10-CM

## 2023-10-09 DIAGNOSIS — R262 Difficulty in walking, not elsewhere classified: Secondary | ICD-10-CM

## 2023-10-09 NOTE — Therapy (Signed)
 OUTPATIENT PHYSICAL THERAPY THORACOLUMBAR TREATMENT/PROGRESS NOTE Progress Note Reporting Period 09/02/23 to 10/09/23  See note below for Objective Data and Assessment of Progress/Goals.  PHYSICAL THERAPY DISCHARGE SUMMARY  Visits from Start of Care: 4  Current functional level related to goals / functional outcomes: See below   Remaining deficits: See below   Education / Equipment: HEP   Patient agrees to discharge. Patient goals were partially met. Patient is being discharged due to progress plateau.       Patient Name: Angel Price MRN: [id number] DOB:1965-05-06, 58 y.o., female Today's Date: 10/09/2023  END OF SESSION:  PT End of Session - 10/09/23 0851     Visit Number 4    Number of Visits 8    Date for PT Re-Evaluation 10/10/23    Authorization Type [employer]    Authorization Time Period 8 visits approved 6/17-7/15    Authorization - Visit Number 4    Authorization - Number of Visits 8    Progress Note Due on Visit 8    PT Start Time 0850    PT Stop Time 0915    PT Time Calculation (min) 25 min    Activity Tolerance Patient tolerated treatment well;Patient limited by pain    Behavior During Therapy [facility] for tasks assessed/performed           Past Medical History:  Diagnosis Date   Abscess    soft tissue   Adrenal mass (HCC)    Alcohol abuse    Anemia    Anxiety    Blood transfusion without reported diagnosis    Chronic abdominal pain    Chronic wound infection of abdomen    Colon polyp    colonoscopy 04/2014   Depression    Discoid lupus erythematosus    Diverticulosis    colonoscopy 04/2014 moderat pan colonic   Drug-seeking behavior    Gastritis    EGD 05/2014   Gastroparesis 01/2014   GERD (gastroesophageal reflux disease)    Hemorrhoid    internal large   Hiatal hernia    History of Billroth II operation    Hypertension    Lung nodule    CT 02/2014 needs repeat 1 month   Lung nodule < 6cm on CT 04/25/2014   Lupus     Malingering    Migraine    Nausea and vomiting    chronic, recurrent   Pancreatitis    Schatzki's ring    patent per EGD 04/2014   Sickle cell trait (HCC)    Stroke (HCC)    Suicide attempt (HCC)    Thyroid  disease 2000   overactive, radiation   Past Surgical History:  Procedure Laterality Date   ABDOMINAL HYSTERECTOMY  2013   [place]   ABDOMINAL HYSTERECTOMY     ABDOMINAL SURGERY     ADRENALECTOMY Right    AGILE CAPSULE N/A 01/05/2015   Procedure: AGILE CAPSULE;  Surgeon: Lamar CHRISTELLA Hollingshead, MD;  [facility];  Service: Endoscopy;  Laterality: N/A;  0700   BALLOON DILATION N/A 10/01/2021   Procedure: BALLOON DILATION;  Surgeon: Cindie Carlin POUR, DO;  [facility];  Service: Endoscopy;  Laterality: N/A;   Billroth II procedure      [place], first 2000, 2005/2006.   bilroth 2     BIOPSY  05/20/2013   Procedure: BIOPSIES OF ASCENDING AND SIGMOID COLON;  Surgeon: Lamar CHRISTELLA Hollingshead, MD;  Location: [facility];  Service: Endoscopy;;   BIOPSY  04/26/2014   Procedure:  BIOPSIES;  Surgeon: Margo LITTIE Haddock, MD;  Location: [facility];  Service: Endoscopy;;   BIOPSY  12/24/2018   Procedure: BIOPSY;  Surgeon: Shaaron Lamar HERO, MD;  [facility];  Service: Endoscopy;;  colon   CHOLECYSTECTOMY     COLONOSCOPY     in [place]   COLONOSCOPY WITH PROPOFOL  N/A 05/20/2013   Dr.Rourk- inadequate prep, normal appearing rectum, grossly normal colon aside from pancolonic diverticula, normal terminal ileum bx= unremarkable colonic mucosa. Due for early interval 2016.    COLONOSCOPY WITH PROPOFOL  N/A 04/26/2014   DOQ:wnmfjo ileum/one colon polyp removed/moderate pan-colonic diverticulosis/large internal hemorrhoids   COLONOSCOPY WITH PROPOFOL  N/A 12/23/2014   Dr.Rourk- minimal internal hemorrhoids, pancolonic diverticulosis   COLONOSCOPY WITH PROPOFOL  N/A 08/23/2016   Procedure: COLONOSCOPY WITH PROPOFOL ;  Surgeon: Haddock Margo LITTIE, MD;  [facility];  Service: Endoscopy;   Laterality: N/A;   COLONOSCOPY WITH PROPOFOL  N/A 12/24/2018   Pancolonic diverticulosis, normal TI, one 5 mm polyp in rectum (tubular adenoma). Somewhat friable hemorrhagic mucosa in area of IC valve/cecum, possibly related to scope trauma s/p biopsy. Repeat in 7 years.   DEBRIDEMENT OF ABDOMINAL WALL ABSCESS N/A 02/08/2013   Procedure: DEBRIDEMENT OF ABDOMINAL WALL ABSCESS;  Surgeon: Oneil DELENA Budge, MD;  Location: [facility];  Service: General;  Laterality: N/A;   ESOPHAGOGASTRODUODENOSCOPY (EGD) WITH PROPOFOL  N/A 05/20/2013   Dr.Rourk- s/p prior gastric surgery with normal esophagus, residual gastric mucosa and patent efferent limb   ESOPHAGOGASTRODUODENOSCOPY (EGD) WITH PROPOFOL  N/A 02/03/2014   Dr. Shaaron:  s/p hemigastrectomy with retained gastric contents. Residual gastric mucosa and efferent limb appeared normal otherwise. Query gastroparesis.    ESOPHAGOGASTRODUODENOSCOPY (EGD) WITH PROPOFOL  N/A 04/26/2014   DOQ:dryjusxp'd ring/small HH/mild non-erosive gasrtitis/normal anastomosis   ESOPHAGOGASTRODUODENOSCOPY (EGD) WITH PROPOFOL  N/A 12/23/2014   Dr.Rourk- s/p prior hemigastrctomy, active oozing from anastomotic suture site, hemostasis achieved   ESOPHAGOGASTRODUODENOSCOPY (EGD) WITH PROPOFOL  N/A 05/23/2016   Dr. 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    ESOPHAGOGASTRODUODENOSCOPY (EGD) WITH PROPOFOL  N/A 05/11/2017   Procedure: ESOPHAGOGASTRODUODENOSCOPY (EGD) WITH PROPOFOL ;  Surgeon: Shaaron Lamar HERO, MD;  [facility];  Service: Endoscopy;  Laterality: N/A;   ESOPHAGOGASTRODUODENOSCOPY (EGD) WITH PROPOFOL  N/A 06/07/2021   Procedure: ESOPHAGOGASTRODUODENOSCOPY (EGD) WITH PROPOFOL ;  Surgeon: Shaaron Lamar HERO, MD;  [facility];  Service: Endoscopy;  Laterality: N/A;  11:45am   ESOPHAGOGASTRODUODENOSCOPY (EGD) WITH PROPOFOL  N/A 10/01/2021   Procedure: ESOPHAGOGASTRODUODENOSCOPY  (EGD) WITH PROPOFOL ;  Surgeon: Cindie Carlin POUR, DO;  [facility];  Service: Endoscopy;  Laterality: N/A;  3:00pm   EXCISION OF MESH  08/2019   HEMORRHOID SURGERY N/A 06/18/2017   Procedure: THREE COLUMN EXTENSIVE HEMORRHOIDECTOMY;  Surgeon: Kallie Manuelita BROCKS, MD;  Location: [facility];  Service: General;  Laterality: N/A;   HERNIA REPAIR     INCISION AND DRAINAGE ABSCESS N/A 01/06/2017   Procedure: INCISION AND DRAINAGE ABDOMINAL WALL ABSCESS;  Surgeon: Budge Oneil, MD;  Location: [facility];  Service: General;  Laterality: N/A;   incisional hernial repair  09/05/2021   [place]   LEFT HEART CATH AND CORONARY ANGIOGRAPHY N/A 05/09/2020   Procedure: LEFT HEART CATH AND CORONARY ANGIOGRAPHY;  Surgeon: Elmira Newman PARAS, MD;  [facility]: [facility];  Service: Cardiovascular;  Laterality: N/A;   LEFT HEART CATH AND CORONARY ANGIOGRAPHY N/A 08/22/2023   Procedure: LEFT HEART CATH AND CORONARY ANGIOGRAPHY;  Surgeon: Verlin Lonni BIRCH, MD;  Location:  [facility];  Service: Cardiovascular;  Laterality: N/A;   POLYPECTOMY  12/24/2018   Procedure: POLYPECTOMY;  Surgeon: Shaaron Lamar HERO, MD;  [facility];  Service: Endoscopy;;  colon   tendon repar Right    wrist   WOUND EXPLORATION Right 06/24/2013   Procedure: exploration of traumatic wound right wrist;  Surgeon: Franky JONELLE Curia, MD;  Location: [facility] OR;  Service: Orthopedics;  Laterality: Right;   Patient Active Problem List   Diagnosis Date Noted   Chest pain 08/21/2023   Dyslipidemia 08/21/2023   Rectal pressure 08/05/2023   Abdominal bloating 08/05/2023   Anemia 08/05/2023   High risk medication use 05/05/2023   Unilateral primary osteoarthritis, left knee 05/05/2023   Seropositive rheumatoid arthritis (HCC) 05/02/2022   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   Cellulitis 10/06/2021   Paroxysmal atrial fibrillation (HCC) 10/06/2021   Abdominal fluid collection 10/06/2021   Constipation  09/14/2021   Ventral hernia 05/15/2021   Dysphagia 05/15/2021   Tobacco dependence 09/12/2020   Continuous dependence on cigarette smoking 08/22/2020   Mixed hyperlipidemia 04/27/2020   Recurrent incisional hernia 02/03/2020   Bilateral knee pain 12/29/2019   Coronary artery disease of native artery of native heart with stable angina pectoris (HCC) 06/25/2019   Acute respiratory failure with hypoxia (HCC) 05/20/2019   Essential hypertension 05/20/2019   COVID-19 05/20/2019   Syncope and collapse 05/20/2019   Metabolic acidosis 05/16/2019   Intractable vomiting with nausea 02/23/2019   Cocaine abuse (HCC)    Colitis 11/11/2018   Hemorrhoidal skin tag    Folate deficiency 05/11/2017   Abdominal wall fistula    Cellulitis of abdominal wall 01/02/2017   Acute left hemiparesis (HCC) 12/05/2016   Malingering 12/05/2016   Weakness of left lower extremity 12/02/2016   CVA (cerebral vascular accident) (HCC) 11/23/2016   Bright red rectal bleeding 08/22/2016   Hypotension due to blood loss    Acute GI bleeding 05/24/2016   Dieulafoy lesion of duodenum    History of Billroth II operation    Gastrointestinal hemorrhage 05/22/2016   Absolute anemia    Acute pancreatitis 10/01/2015   Alcohol abuse with intoxication (HCC)    Left-sided weakness    Psychosomatic factor in physical condition    Upper GI bleed    Diverticulosis of colon with hemorrhage    Chronic wound infection of abdomen 12/22/2014   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   HTN (hypertension) 12/22/2014   Ataxia 11/01/2014   Hemorrhoids 04/26/2014   Lung nodule 04/26/2014   Diverticulosis    Gastritis    Hiatal hernia    Schatzki's ring    Acute blood loss anemia 04/25/2014   Hypokalemia 04/25/2014   Hematemesis with nausea 04/25/2014   Lung nodule < 6cm on CT 04/25/2014   Intractable nausea and vomiting 04/24/2014   Gastroparesis    Abdominal pain 04/01/2014   Gastroenteritis 12/10/2013   Chronic abdominal wound  infection 08/16/2013   MDD (major depressive disorder), recurrent episode, severe (HCC) 06/27/2013   Wrist laceration 06/24/2013   Diarrhea 05/07/2013   Rectal bleeding 05/07/2013   Adrenal mass, left (HCC) 05/07/2013   Post-operative wound abscess 04/19/2013   Abdominal wall abscess 04/18/2013   Essential hypertension, benign 03/30/2013   History of cocaine abuse (HCC) 03/16/2013   History of schizoaffective disorder 03/16/2013   Bipolar disorder (HCC) 03/16/2013   Personality disorder (HCC) 03/16/2013   Tobacco use disorder 03/16/2013   Alcohol abuse 03/16/2013   Palpitations 03/16/2013   Poor vision 03/16/2013  History of gastric bypass 03/16/2013   Status post hysterectomy with oophorectomy 03/16/2013   Hypothyroid 03/16/2013   SLE (systemic lupus erythematosus) (HCC) 03/16/2013    PCP: Dr. Carlette  REFERRING PROVIDER: Daris Merlynn BRAVO, NP  REFERRING DIAG: Diagnosis M54.16 (ICD-10-CM) - Radiculopathy, lumbar region G89.4 (ICD-10-CM) - Chronic pain syndrome M47.816 (ICD-10-CM) - Spondylosis without myelopathy or radiculopathy, lumbar region  Rationale for Evaluation and Treatment: Rehabilitation  THERAPY DIAG:  Low back pain, unspecified back pain laterality, unspecified chronicity, unspecified whether sciatica present  Difficulty in walking, not elsewhere classified  Radiculopathy, lumbar region  ONSET DATE: about 6 months or so  SUBJECTIVE:                                                                                                                                                                                           SUBJECTIVE STATEMENT: About 25% better overall.  Had an epidural 09/08/23; actually ended up in the ED the next day.  States MD is now wanting to do a nerve ablation.  She feels she knows her exercises well.    Eval: Back pain maybe 6 months or so; insidious onset; constant pain; worse with prolonged sitting and with prolonged walking; back  pain that goes down into the legs; originally just left now it's both; having an epidural injection 09/08/23  PERTINENT HISTORY:  Lupus afib Pt reports having 5 strokes first being 2017 and 3 in March (TIAs) PAIN:  Are you having pain? Yes: NPRS scale: 6/10 Pain location: low back down both legs to ankle Pain description: aching Aggravating factors: prolonged standing, sitting Relieving factors: pain medication, heat, ice  PRECAUTIONS: None    WEIGHT BEARING RESTRICTIONS: No  FALLS:  Has patient fallen in last 6 months? No   OCCUPATION: not currently working  PLOF: Independent  PATIENT GOALS: not to flex forward when walking  NEXT MD VISIT: 09/08/23 having an epidural injection  OBJECTIVE:  Note: Objective measures were completed at Evaluation unless otherwise noted.  DIAGNOSTIC FINDINGS:  IMPRESSION: Normal right hip radiographs.  L4-L5 mild degenerative changes per xray; clips in abdomen from multiple abdominal surgery  PATIENT SURVEYS:  Modified Oswestry 34/50 68%   COGNITION: Overall cognitive status: Within functional limits for tasks assessed     SENSATION: WFL Will occasionally get some numbness and tingling in left hand  MUSCLE LENGTH: Hamstrings: Right  deg; Left  deg POSTURE: rounded shoulders, forward head, and flexed trunk   PALPATION: Tender L3 to S1 paraspinals  LUMBAR ROM: *painful  AROM eval 10/09/23  Flexion Fingertips to top of patella* Fingertips to 1 above top of patella  Extension Unable  to fully extend to neutral* 30% available Feels good  Right lateral flexion Fingertips 1 to righ   Left lateral flexion Fingertips 2 to left   Right rotation    Left rotation     (Blank rows = not tested)  LOWER EXTREMITY ROM:     Active  Right eval Left eval  Hip flexion    Hip extension    Hip abduction    Hip adduction    Hip internal rotation    Hip external rotation    Knee flexion    Knee extension    Ankle dorsiflexion     Ankle plantarflexion    Ankle inversion    Ankle eversion     (Blank rows = not tested)  LOWER EXTREMITY MMT:    MMT Right eval Left eval Right 10/09/23 Left 10/09/23  Hip flexion 4+ 4* 4+ 4-  Hip extension 3* 3-* 3- 3  Hip abduction      Hip adduction      Hip internal rotation      Hip external rotation      Knee flexion 4+ 3+* 4 4+  Knee extension 5 4+ 5 4  Ankle dorsiflexion 5 4+ 5 4  Ankle plantarflexion      Ankle inversion      Ankle eversion       (Blank rows = not tested)  FUNCTIONAL TESTS:  5 times sit to stand: 28.79 SLS unable either side  GAIT: Distance walked: 50 ft in clinic Assistive device utilized: None Level of assistance: Modified independence Comments: antalgic slow gait  TREATMENT DATE:  10/09/23 Progress note Modified Oswestry 31/50 62% 5 times sit to stand 21.36 sec  MMTs and AROM see above  10/07/23 Standing:  heelraises on incline 20X  Vectors 5X3 each LE with bil UE assist  Lateral side stepping 10 ft line, GTB at ankles [employer] walks 10 ft line, GTB at ankles 3RT  Lumbar extensions 2X10 Supine:  bridge 2X10  LTR 10X  SLR 10X each Prone laying prone (45 and had to get up) Sit to stands 10X Long sitting Hamstring stretch 3X30     09/24/2023  Therapeutic Exercise: -Supine bridges, 1 sets of 10 reps, 3 second holds, symptomatic, pt cued for max hip extension -Standing 3 way hip 1 sets 5 reps, bilaterally, pt cued for upright trunk and maintaining of neutral spine, left side better performance -Lateral stepping 3 laps 20 feet per lap, with GTB around ankles, pt cued for upright posture -Monster walks with GTB at ankles, 3 laps, 20 feet per lap, pt cued for controlled movement -DKTC on green exercise ball, 2 sets of 10 reps, pt cued for pain free ROM   Neuromuscular Re-education: -PPT 1 set of 10 reps, 3 second holds, pt cued to remain in pain free ROM -LTR 2 set of 10 reps bilaterally, pt cued to remain in pain free ROM,  restricted ROM observed to the right  Therapeutic Activity: -Sit to stands, 2 sets of 10 reps, pt cued for core activation   09/02/23 physical therapy evaluation and HEP instruction  PATIENT EDUCATION:  Education details: Patient educated on exam findings, POC, scope of PT, HEP. Person educated: Patient Education method: Explanation, Demonstration, and Handouts Education comprehension: verbalized understanding, returned demonstration, verbal cues required, and tactile cues required   HOME EXERCISE PROGRAM: Access Code: [email or web address] URL: [email or web address] Date: 09/02/2023 Prepared by: [facility] - Rehab Exercises - Supine Lower Trunk Rotation  - 2 x daily - 7 x weekly - 1 sets - 10 reps - 5 sec hold - Hooklying Single Knee to Chest Stretch with Towel  - 2 x daily - 7 x weekly - 1 sets - 5 reps - 10 sec hold - Supine Transversus Abdominis Bracing - Hands on Stomach  - 2 x daily - 7 x weekly - 1 sets - 10 reps - 5 sec hold  Access Code: [email or web address] URL: [email or web address] Date: 09/24/2023 Prepared by: Lang Ada  Exercises - Supine Bridge  - 1 x daily - 7 x weekly - 3 sets - 10 reps - Standing 3-way kick, with counter support  - 1 x daily - 7 x weekly - 3 sets - 10 reps  ASSESSMENT:  CLINIC IMPRESSION: Progress note today.  Patient has made minimal progress with physical therapy demonstrating mild improvements in some areas but decline in others.  She would like to discharge at this time due to progress plateau.     Patient is a 58 y.o. female who was seen today for physical therapy evaluation and treatment for Diagnosis M54.16 (ICD-10-CM) - Radiculopathy, lumbar region G89.4 (ICD-10-CM) - Chronic pain syndrome M47.816 (ICD-10-CM) - Spondylosis without myelopathy or radiculopathy, lumbar region.  Patient demonstrates muscle weakness,  reduced ROM, and fascial restrictions which are likely contributing to symptoms of pain and are negatively impacting patient ability to perform ADLs and functional mobility tasks. Patient will benefit from skilled physical therapy services to address these deficits to reduce pain and improve level of function with ADLs and functional mobility tasks.   OBJECTIVE IMPAIRMENTS: Abnormal gait, decreased activity tolerance, decreased balance, decreased mobility, difficulty walking, decreased ROM, decreased strength, increased fascial restrictions, impaired perceived functional ability, and pain.   ACTIVITY LIMITATIONS: carrying, lifting, bending, sitting, standing, squatting, sleeping, stairs, bed mobility, locomotion level, and caring for others  PARTICIPATION LIMITATIONS: meal prep, cleaning, laundry, medication management, and community activity  PERSONAL FACTORS: 1 comorbidity: lupus are also affecting patient's functional outcome.   REHAB POTENTIAL: Good  CLINICAL DECISION MAKING: Evolving/moderate complexity  EVALUATION COMPLEXITY: Moderate   GOALS: Goals reviewed with patient? No  SHORT TERM GOALS: Target date: 09/23/2023  patient will be independent with initial HEP   Baseline: Goal status: met  2.  Patient will report 50% improvement overall  Baseline: 25% better Goal status: in progress   LONG TERM GOALS: Target date: 10/10/2023  Patient will be independent in self management strategies to improve quality of life and functional outcomes.  Baseline:  Goal status: INITIAL  2.  Patient will report 75% improvement overall  Baseline:  Goal status: in progress  3.  Patient will able to stand SLS on left leg x 10 to demonstrate improved functional balance  Baseline: unable each side at eval; right 1 and left unable Goal status: in progress  4.  Patient will improve 5 times sit to stand score to 20 sec or less to demonstrate improved functional mobility and increased  leg strength.    Baseline: 28.79; 21.36 Goal status: in progress  5.   Patient will increase bilateral leg MMT's to 4+ to 5/5 to allow navigation  of steps without gait deviation or loss of balance  Baseline: see above Goal status: in progress  6.  Patient will improve Modified Oswestry score by 14 points to demonstrate improved perceived function  Baseline: 34/50 at eval; 31/50 10/09/23 Goal status: in progress  PLAN:  PT FREQUENCY: 2x/week  PT DURATION: 4 weeks  PLANNED INTERVENTIONS:[id number]- PT Re-evaluation, 97110-Therapeutic exercises, 97530- Therapeutic activity, 97112- Neuromuscular re-education, 97535- Self Care, [id number]- Manual therapy, [id number]- Gait training, [phone]- Orthotic Fit/training, [phone]- Canalith repositioning, [id number]- Aquatic Therapy, [phone]- Splinting, [phone]- Wound care (first 20 sq cm), 97598- Wound care (each additional 20 sq cm)Patient/Family education, Balance training, Stair training, Taping, Dry Needling, Joint mobilization, Joint manipulation, Spinal manipulation, Spinal mobilization, Scar mobilization, and DME instructions.  PLAN FOR NEXT SESSION: discharge  9:16 AM, 10/09/23 Ovila Lepage Small Christianna Belmonte MPT [place] [facility] [place] [phone]

## 2023-10-13 ENCOUNTER — Encounter (HOSPITAL_COMMUNITY): Admitting: Physical Therapy

## 2023-10-16 ENCOUNTER — Ambulatory Visit: Admitting: Cardiology

## 2023-10-17 ENCOUNTER — Encounter (HOSPITAL_COMMUNITY)

## 2023-10-20 NOTE — Progress Notes (Signed)
 Office Visit Note  Patient: Angel Price             Date of Birth: 1965-09-03           MRN: [id number]             PCP: Carlette Benita Area, MD Referring: Carlette Benita Area* Visit Date: 11/03/2023   Subjective:  Pain of the Right Hip, Pain of the Left Shoulder, and Follow-up (Bilateral Knee pain )    Discussed the use of AI scribe software for clinical note transcription with the patient, who gave verbal consent to proceed.  History of Present Illness   Angel Price is a 58 y.o. female here for follow up with rheumatoid arthritis on hydroxychloroquine  300 mg daily.    She has been experiencing significant issues with her knees, describing them as 'popping off' and attributing some of her joint pain to recent weight gain. Se has gained about 15 pounds compared to earlier this year, which she believes is affecting her knees and breathing. She had previous back injections did not provide relief. She is scheduled for a nerve ablation procedure on August 25th.  Her hip is currently her biggest problem, with pain that she suspects may be related to her back. She has been experiencing issues with her left arm for the past couple of months, noting that she sometimes has to move it manually because it won't move on its own. She experiences tingling in her hand, particularly in her fingers, which she describes as feeling 'like they sleep.' This occurs daily and sometimes wakes her up at night. No previous diagnosis of carpal tunnel syndrome.  She has a history of rheumatoid arthritis and has been on hydroxychloroquine . She previously tried oral prednisone . Her recent blood work showed slightly low potassium levels, which she attributes to a diuretic she was taken off of.   Recent labs including CBC and BMP reviewed with hypokalemia but renal function normal.   Previous HPI 05/05/2023 Angel Price is a 58 year old female with rheumatoid arthritis on  hydroxychloroquine  300 mg daily. She presents for routine follow up but with worsening joint pain and swelling.   She has been experiencing swelling and pain in her hands and hip for the past three weeks. The swelling affects both hands equally, causing difficulty in gripping objects. The hip pain is constant and not associated with specific movements or positions.   She notes numbness in her fingers, which is a new symptom accompanying the joint pain and swelling.   She mentions soreness in her left knee, particularly on the side of the hip, but does not experience pain radiating to the front of the leg.   She has a history of rheumatoid arthritis and is currently taking Plaquenil . She has not been using any additional medications for joint pain or inflammation recently. She recalls a previous episode of inflammation for which she was prescribed a steroid, which she found helpful. No recent illness or swelling in her feet or ankles.         Previous HPI 10/31/22 Angel Price is a 58 y.o. female here for follow up for seropositive Price on hydroxychloroquine  300 mg daily.  Since her last visit she is doing worse significant increase in joint pains mostly the hands and knees.  She sees visible swelling affecting her hands most days somewhat worse on the left side.  Knee pain and swelling is also worse in the left side.  Not  taking any specific medications for the symptoms.  She saw the emergency department at the end of February for radiating left arm pain thought consistent for neuritis treated with a short course of oral prednisone  and oxycodone .  She did not recall any new preceding illness or injury and has been taking hydroxychloroquine  consistently.  She is smoking cigarettes.   Previous HPI 05/02/2022 Angel Price is a 58 y.o. female here for follow up for seropositive Price on hydroxychloroquine  300 mg daily.  Since her last visit she has been doing pretty well not having as much persistent  hip or knee pain compared to what she felt in December.  Has not experienced any major flareups with peripheral joint swelling.  She denies any serious infections since her last visit.   Previous HPI 03/06/22 Angel Price is a 58 y.o. female here for follow up for seropositive rheumatoid arthritis (RF+, ANA+) on HCQ 300 mg daily. She is experiencing increased pain in bilateral hips and knees with a lot of associated joint popping and feeling of weakness or instability in her legs. This started since about a month ago and she does not recall any events, illness, or medical changes at the time. Not seeing visible swelling in the areas. She has not fallen. She reports normal feeling in her legs.  She is not taking any specific treatment for these symptoms besides her normal arthritis medication and pain medication. She has not taken any blood pressure medications today and says she never takes these until eating and plans to at lunch.     Previous HPI 12/08/20 Angel Price is a 58 y.o. female here for lupus. 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 Previous serology showed positive ANA and RF. Past imaging showing degenerative changes in lumbar spine.  She has not followed up with any rheumatologist for many years. She recalls use of NSAIDs and steroids and thinks plaquenil  sounds familiar but not sure of an other long term lupus treatment. 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  She is currently following with cardiology for angina loss of consciousness no obstructive CAD on extensive work-up including coronary angiography attributed to suspected microvascular dysfunction. She is required abdominal surgeries for gastrointestinal bleeding with dieulafoy lesion of duodenum.  Abdominal mesh  surgery for repair of incisional hernia was complicated with infection attributed in part to continue nicotine  abuse.   Review of Systems  Constitutional:  Positive for fatigue.  HENT:  Positive for mouth dryness. Negative for mouth sores.   Eyes:  Negative for dryness.  Respiratory:  Positive for shortness of breath.   Cardiovascular:  Positive for palpitations. Negative for chest pain.  Gastrointestinal:  Negative for blood in stool, constipation and diarrhea.  Endocrine: Positive for increased urination.  Genitourinary:  Positive for involuntary urination.  Musculoskeletal:  Positive for joint pain, gait problem, joint pain, joint swelling, myalgias, muscle weakness, morning stiffness, muscle tenderness and myalgias.  Skin:  Positive for rash and sensitivity to sunlight. Negative for color change and hair loss.  Allergic/Immunologic: Positive for susceptible to infections.  Neurological:  Negative for dizziness and headaches.  Hematological:  Negative for swollen glands.  Psychiatric/Behavioral:  Negative for depressed mood and sleep disturbance. The patient is not nervous/anxious.     PMFS History:  Patient Active Problem List   Diagnosis Date  Noted   Chest pain 08/21/2023   Dyslipidemia 08/21/2023   Rectal pressure 08/05/2023   Abdominal bloating 08/05/2023   Anemia 08/05/2023   High risk medication use 05/05/2023   Unilateral primary osteoarthritis, left knee 05/05/2023   Seropositive rheumatoid arthritis (HCC) 05/02/2022   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   Cellulitis 10/06/2021   Paroxysmal atrial fibrillation (HCC) 10/06/2021   Abdominal fluid collection 10/06/2021   Constipation 09/14/2021   Ventral hernia 05/15/2021   Dysphagia 05/15/2021   Tobacco dependence 09/12/2020   Continuous dependence on cigarette smoking 08/22/2020   Mixed hyperlipidemia 04/27/2020   Recurrent incisional hernia 02/03/2020   Bilateral knee pain 12/29/2019   Coronary artery  disease of native artery of native heart with stable angina pectoris (HCC) 06/25/2019   Acute respiratory failure with hypoxia (HCC) 05/20/2019   Essential hypertension 05/20/2019   COVID-19 05/20/2019   Syncope and collapse 05/20/2019   Metabolic acidosis 05/16/2019   Intractable vomiting with nausea 02/23/2019   Cocaine abuse (HCC)    Colitis 11/11/2018   Hemorrhoidal skin tag    Folate deficiency 05/11/2017   Abdominal wall fistula    Cellulitis of abdominal wall 01/02/2017   Acute left hemiparesis (HCC) 12/05/2016   Malingering 12/05/2016   Weakness of left lower extremity 12/02/2016   CVA (cerebral vascular accident) (HCC) 11/23/2016   Bright red rectal bleeding 08/22/2016   Hypotension due to blood loss    Acute GI bleeding 05/24/2016   Dieulafoy lesion of duodenum    History of Billroth II operation    Gastrointestinal hemorrhage 05/22/2016   Absolute anemia    Acute pancreatitis 10/01/2015   Alcohol abuse with intoxication (HCC)    Left-sided weakness    Psychosomatic factor in physical condition    Upper GI bleed    Diverticulosis of colon with hemorrhage    Chronic wound infection of abdomen 12/22/2014   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   HTN (hypertension) 12/22/2014   Ataxia 11/01/2014   Hemorrhoids 04/26/2014   Lung nodule 04/26/2014   Diverticulosis    Gastritis    Hiatal hernia    Schatzki's ring    Acute blood loss anemia 04/25/2014   Hypokalemia 04/25/2014   Hematemesis with nausea 04/25/2014   Lung nodule < 6cm on CT 04/25/2014   Intractable nausea and vomiting 04/24/2014   Gastroparesis    Abdominal pain 04/01/2014   Gastroenteritis 12/10/2013   Chronic abdominal wound infection 08/16/2013   MDD (major depressive disorder), recurrent episode, severe (HCC) 06/27/2013   Wrist laceration 06/24/2013   Diarrhea 05/07/2013   Rectal bleeding 05/07/2013   Adrenal mass, left (HCC) 05/07/2013   Post-operative wound abscess 04/19/2013   Abdominal wall  abscess 04/18/2013   Essential hypertension, benign 03/30/2013   History of cocaine abuse (HCC) 03/16/2013   History of schizoaffective disorder 03/16/2013   Bipolar disorder (HCC) 03/16/2013   Personality disorder (HCC) 03/16/2013   Tobacco use disorder 03/16/2013   Alcohol abuse 03/16/2013   Palpitations 03/16/2013   Poor vision 03/16/2013   History of gastric bypass 03/16/2013   Status post hysterectomy with oophorectomy 03/16/2013   Hypothyroid 03/16/2013   SLE (systemic lupus erythematosus) (HCC) 03/16/2013    Past Medical History:  Diagnosis Date   Abscess    soft tissue   Adrenal mass (HCC)    Alcohol abuse    Anemia    Anxiety    Blood transfusion without reported diagnosis    Chronic abdominal pain    Chronic wound infection of abdomen  Colon polyp    colonoscopy 04/2014   Depression    Discoid lupus erythematosus    Diverticulosis    colonoscopy 04/2014 moderat pan colonic   Drug-seeking behavior    Gastritis    EGD 05/2014   Gastroparesis 01/2014   GERD (gastroesophageal reflux disease)    Hemorrhoid    internal large   Hiatal hernia    History of Billroth II operation    Hypertension    Lung nodule    CT 02/2014 needs repeat 1 month   Lung nodule < 6cm on CT 04/25/2014   Lupus    Malingering    Migraine    Nausea and vomiting    chronic, recurrent   Pancreatitis    Schatzki's ring    patent per EGD 04/2014   Sickle cell trait (HCC)    Stroke (HCC)    Suicide attempt (HCC)    Thyroid  disease 2000   overactive, radiation    Family History  Problem Relation Age of Onset   Drug abuse Mother    Lung cancer Father    Cancer Father        mets   Drug abuse Sister    Drug abuse Brother    Breast cancer Maternal Aunt    Bipolar disorder Maternal Aunt    Drug abuse Maternal Aunt    Colon cancer Maternal Grandmother        late 55s, early 58s   Bipolar disorder Paternal Grandfather    Brain cancer Son    Schizophrenia Son    Cancer Son         brain   Bipolar disorder Cousin    Liver disease Neg Hx    Past Surgical History:  Procedure Laterality Date   ABDOMINAL HYSTERECTOMY  2013   [place]   ABDOMINAL HYSTERECTOMY     ABDOMINAL SURGERY     ADRENALECTOMY Right    AGILE CAPSULE N/A 01/05/2015   Procedure: AGILE CAPSULE;  Surgeon: Lamar CHRISTELLA Hollingshead, MD;  [facility];  Service: Endoscopy;  Laterality: N/A;  0700   BALLOON DILATION N/A 10/01/2021   Procedure: BALLOON DILATION;  Surgeon: Cindie Carlin POUR, DO;  [facility]: [facility];  Service: Endoscopy;  Laterality: N/A;   Billroth II procedure      [place], first 2000, 2005/2006.   bilroth 2     BIOPSY  05/20/2013   Procedure: BIOPSIES OF ASCENDING AND SIGMOID COLON;  Surgeon: Lamar CHRISTELLA Hollingshead, MD;  Location: [facility];  Service: Endoscopy;;   BIOPSY  04/26/2014   Procedure: BIOPSIES;  Surgeon: Margo LITTIE Haddock, MD;  Location: [facility];  Service: Endoscopy;;   BIOPSY  12/24/2018   Procedure: BIOPSY;  Surgeon: Hollingshead Lamar CHRISTELLA, MD;  [facility];  Service: Endoscopy;;  colon   CHOLECYSTECTOMY     COLONOSCOPY     in [place]   COLONOSCOPY WITH PROPOFOL  N/A 05/20/2013   Dr.Rourk- inadequate prep, normal appearing rectum, grossly normal colon aside from pancolonic diverticula, normal terminal ileum bx= unremarkable colonic mucosa. Due for early interval 2016.    COLONOSCOPY WITH PROPOFOL  N/A 04/26/2014   DOQ:wnmfjo ileum/one colon polyp removed/moderate pan-colonic diverticulosis/large internal hemorrhoids   COLONOSCOPY WITH PROPOFOL  N/A 12/23/2014   Dr.Rourk- minimal internal hemorrhoids, pancolonic diverticulosis   COLONOSCOPY WITH PROPOFOL  N/A 08/23/2016   Procedure: COLONOSCOPY WITH PROPOFOL ;  Surgeon: Haddock Margo LITTIE, MD;  [facility];  Service: Endoscopy;  Laterality: N/A;   COLONOSCOPY WITH PROPOFOL  N/A 12/24/2018  Pancolonic diverticulosis, normal TI, one 5 mm polyp in rectum (tubular adenoma). Somewhat friable hemorrhagic mucosa in area of  IC valve/cecum, possibly related to scope trauma s/p biopsy. Repeat in 7 years.   DEBRIDEMENT OF ABDOMINAL WALL ABSCESS N/A 02/08/2013   Procedure: DEBRIDEMENT OF ABDOMINAL WALL ABSCESS;  Surgeon: Oneil DELENA Budge, MD;  Location: [facility];  Service: General;  Laterality: N/A;   ESOPHAGOGASTRODUODENOSCOPY (EGD) WITH PROPOFOL  N/A 05/20/2013   Dr.Rourk- s/p prior gastric surgery with normal esophagus, residual gastric mucosa and patent efferent limb   ESOPHAGOGASTRODUODENOSCOPY (EGD) WITH PROPOFOL  N/A 02/03/2014   Dr. Shaaron:  s/p hemigastrectomy with retained gastric contents. Residual gastric mucosa and efferent limb appeared normal otherwise. Query gastroparesis.    ESOPHAGOGASTRODUODENOSCOPY (EGD) WITH PROPOFOL  N/A 04/26/2014   DOQ:dryjusxp'd ring/small HH/mild non-erosive gasrtitis/normal anastomosis   ESOPHAGOGASTRODUODENOSCOPY (EGD) WITH PROPOFOL  N/A 12/23/2014   Dr.Rourk- s/p prior hemigastrctomy, active oozing from anastomotic suture site, hemostasis achieved   ESOPHAGOGASTRODUODENOSCOPY (EGD) WITH PROPOFOL  N/A 05/23/2016   Dr. 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    ESOPHAGOGASTRODUODENOSCOPY (EGD) WITH PROPOFOL  N/A 05/11/2017   Procedure: ESOPHAGOGASTRODUODENOSCOPY (EGD) WITH PROPOFOL ;  Surgeon: Shaaron Lamar HERO, MD;  [facility];  Service: Endoscopy;  Laterality: N/A;   ESOPHAGOGASTRODUODENOSCOPY (EGD) WITH PROPOFOL  N/A 06/07/2021   Procedure: ESOPHAGOGASTRODUODENOSCOPY (EGD) WITH PROPOFOL ;  Surgeon: Shaaron Lamar HERO, MD;  [facility];  Service: Endoscopy;  Laterality: N/A;  11:45am   ESOPHAGOGASTRODUODENOSCOPY (EGD) WITH PROPOFOL  N/A 10/01/2021   Procedure: ESOPHAGOGASTRODUODENOSCOPY (EGD) WITH PROPOFOL ;  Surgeon: Cindie Carlin POUR, DO;  [facility];  Service: Endoscopy;  Laterality: N/A;  3:00pm   EXCISION OF MESH  08/2019   HEMORRHOID SURGERY N/A 06/18/2017    Procedure: THREE COLUMN EXTENSIVE HEMORRHOIDECTOMY;  Surgeon: Kallie Manuelita BROCKS, MD;  Location: [facility];  Service: General;  Laterality: N/A;   HERNIA REPAIR     INCISION AND DRAINAGE ABSCESS N/A 01/06/2017   Procedure: INCISION AND DRAINAGE ABDOMINAL WALL ABSCESS;  Surgeon: Budge Oneil, MD;  Location: [facility];  Service: General;  Laterality: N/A;   incisional hernial repair  09/05/2021   [place]   LEFT HEART CATH AND CORONARY ANGIOGRAPHY N/A 05/09/2020   Procedure: LEFT HEART CATH AND CORONARY ANGIOGRAPHY;  Surgeon: Elmira Newman PARAS, MD;  [facility];  Service: Cardiovascular;  Laterality: N/A;   LEFT HEART CATH AND CORONARY ANGIOGRAPHY N/A 08/22/2023   Procedure: LEFT HEART CATH AND CORONARY ANGIOGRAPHY;  Surgeon: Verlin Lonni BIRCH, MD;  Location: [facility];  Service: Cardiovascular;  Laterality: N/A;   POLYPECTOMY  12/24/2018   Procedure: POLYPECTOMY;  Surgeon: Shaaron Lamar HERO, MD;  [facility];  Service: Endoscopy;;  colon   tendon repar Right    wrist   WOUND EXPLORATION Right 06/24/2013   Procedure: exploration of traumatic wound right wrist;  Surgeon: Franky JONELLE Curia, MD;  Location: [facility];  Service: Orthopedics;  Laterality: Right;   Social History   Social History Narrative   Caffeine- tea occass   Immunization History  Administered Date(s) Administered   Influenza,inj,Quad PF,6+ Mos 02/08/2013, 04/02/2014, 12/23/2014, 12/03/2016   Influenza-Unspecified 02/08/2013, 04/02/2014, 12/23/2014, 12/03/2016   Pneumococcal Polysaccharide-23 06/25/2013, 12/03/2016, 11/12/2018   Tdap 03/16/2013, 06/24/2013     Objective: Vital Signs: BP 125/83 (BP Location: Right Arm, Patient Position: Sitting, Cuff Size: Normal)   Pulse (!) 59   Resp 16   Ht 5' 3 (1.6 m)  Wt 171 lb 8 oz (77.8 kg)   LMP  (LMP Unknown)   BMI 30.38 kg/m    Physical Exam HENT:     Mouth/Throat:     Mouth: Mucous membranes are moist.     Pharynx: Oropharynx is  clear.  Eyes:     Conjunctiva/sclera: Conjunctivae normal.  Cardiovascular:     Rate and Rhythm: Normal rate and regular rhythm.  Pulmonary:     Effort: Pulmonary effort is normal.     Breath sounds: Normal breath sounds.  Musculoskeletal:     Right lower leg: No edema.     Left lower leg: No edema.  Lymphadenopathy:     Cervical: No cervical adenopathy.  Skin:    General: Skin is warm and dry.     Findings: No rash.  Neurological:     Mental Status: She is alert.  Psychiatric:        Mood and Affect: Mood normal.      Musculoskeletal Exam:  Shoulders full ROM left shoulder pain with overhead abduction and with internal rotation while raised, mild anterior tenderness to pressure, no palpable swelling Elbows full ROM no tenderness or swelling Wrists full ROM, left wrist very painful with percussion over carpal tunnel with radiation into fingers Fingers full ROM, MCP joint tenderness both hands without palpable synovitis Widespread paraspinal and midline back tenderness to pressure Knees full ROM no swelling, bilateral knee tenderness to pressure on joint line and with full extension Ankles full ROM no tenderness or swelling  Investigation: No additional findings.  Imaging: XR Shoulder Left Result Date: 11/03/2023 X-ray left shoulder 4 views Humerus position appears normal in internal/external rotation.  Glenohumeral joint space normal.  Acromiohumeral distance is normal.  Acromioclavicular joint space appears normal.  No visible effusion or soft tissue calcifications. Impression No significant appearing osteoarthritis   Recent Labs: Lab Results  Component Value Date   WBC 10.6 (H) 09/09/2023   HGB 12.5 09/09/2023   PLT 364 09/09/2023   NA 138 09/09/2023   K 3.1 (L) 09/09/2023   CL 110 09/09/2023   CO2 20 (L) 09/09/2023   GLUCOSE 102 (H) 09/09/2023   BUN 12 09/09/2023   CREATININE 0.40 (L) 09/09/2023   BILITOT 0.7 08/21/2023   ALKPHOS 87 08/21/2023   AST 25  08/21/2023   ALT 29 08/21/2023   PROT 7.6 08/21/2023   ALBUMIN 4.0 08/21/2023   CALCIUM  9.0 09/09/2023   GFRAA 119 05/05/2020    Speciality Comments: PLQ Eye Exam 08/10/2021 [facility] f/u 1 month  Procedures:  No procedures performed Allergies: Metoclopramide  and Codeine   Assessment / Plan:     Visit Diagnoses: Seropositive rheumatoid arthritis (HCC) - Plan: hydroxychloroquine  (PLAQUENIL ) 200 MG tablet, predniSONE  (DELTASONE ) 5 MG tablet, Sedimentation rate, C-reactive protein Current symptoms likely related to osteoarthritis. Inflammation markers not elevated but we will repeat.  May have limited efficacy of hydroxychloroquine  with ongoing smoking lower threshold to consider switch to possible methotrexate if confirming ongoing inflammation.  Knee pain appears to be more functional or related to early osteoarthritis, exacerbated by weight gain.  No palpable effusions and no other peripheral joint synovitis or dactylitis on exam today. - Encourage weight loss to reduce knee pressure. - Recommend leg strengthening exercises.   - Rechecking sed rate and CRP for disease activity monitoring - Continue hydroxychloroquine  300 mg daily - Prednisone  taper for 12 days starting at 20 mg  High risk medication use - hydroxychloroquine  300 mg daily, PLQ Eye Exam 08/10/2021.  Needs updated PLQ eye exam. Recent CBC and BMP reviewed appropriate for medication monitoring on continued hydroxychloroquine .  No serious interval infections.  Recent extensive cardiac workup without associated problems.  Chronic left shoulder pain - Plan: XR Shoulder Left Shoulder pain seems more typical for some bursitis or rotator cuff arthropathy and may be having secondary shoulder impingement.  We do not have any imaging on file for comparison though so we will obtain a basic x-ray to rule out structural cause not interested in consideration of injection treatment for this right now. -X-ray left shoulder -  Provided shoulder exercises handout  Probable right carpal tunnel syndrome Probable right carpal tunnel syndrome with tingling and numbness. Differential includes nerve compression at shoulder versus wrist. - Order nerve conduction study to determine site and severity of nerve compression. - Provided information on stretching exercises and nighttime wrist splinting for carpal tunnel - Consider carpal tunnel injection if compression confirmed at wrist.  Chronic low back pain with associated hip pain Chronic low back pain with hip pain likely related to back issues. Previous interventions ineffective. She has scheduled nerve ablation on November 10, 2023.  Orders: Orders Placed This Encounter  Procedures   XR Shoulder Left   Sedimentation rate   C-reactive protein   Meds ordered this encounter  Medications   hydroxychloroquine  (PLAQUENIL ) 200 MG tablet    Sig: Take 1.5 tablets (300 mg total) by mouth daily.    Dispense:  135 tablet    Refill:  1   predniSONE  (DELTASONE ) 5 MG tablet    Sig: Take 4 tablets (20 mg total) by mouth daily with breakfast for 3 days, THEN 3 tablets (15 mg total) daily with breakfast for 3 days, THEN 2 tablets (10 mg total) daily with breakfast for 3 days, THEN 1 tablet (5 mg total) daily with breakfast for 3 days.    Dispense:  30 tablet    Refill:  0     Follow-Up Instructions: Return in about 3 months (around 02/03/2024).   Lonni LELON Ester, MD  Note - This record has been created using [employer].  Chart creation errors have been sought, but may not always  have been located. Such creation errors do not reflect on  the standard of medical care.

## 2023-11-03 ENCOUNTER — Ambulatory Visit: Payer: Medicare Other | Attending: Internal Medicine | Admitting: Internal Medicine

## 2023-11-03 ENCOUNTER — Ambulatory Visit (INDEPENDENT_AMBULATORY_CARE_PROVIDER_SITE_OTHER)

## 2023-11-03 ENCOUNTER — Encounter: Payer: Self-pay | Admitting: Internal Medicine

## 2023-11-03 ENCOUNTER — Other Ambulatory Visit: Payer: Self-pay | Admitting: *Deleted

## 2023-11-03 VITALS — BP 125/83 | HR 59 | Resp 16 | Ht 63.0 in | Wt 171.5 lb

## 2023-11-03 DIAGNOSIS — M059 Rheumatoid arthritis with rheumatoid factor, unspecified: Secondary | ICD-10-CM | POA: Insufficient documentation

## 2023-11-03 DIAGNOSIS — G8929 Other chronic pain: Secondary | ICD-10-CM | POA: Insufficient documentation

## 2023-11-03 DIAGNOSIS — M25512 Pain in left shoulder: Secondary | ICD-10-CM

## 2023-11-03 DIAGNOSIS — M1712 Unilateral primary osteoarthritis, left knee: Secondary | ICD-10-CM | POA: Diagnosis present

## 2023-11-03 DIAGNOSIS — M25532 Pain in left wrist: Secondary | ICD-10-CM

## 2023-11-03 DIAGNOSIS — Z79899 Other long term (current) drug therapy: Secondary | ICD-10-CM | POA: Insufficient documentation

## 2023-11-03 MED ORDER — PREDNISONE 5 MG PO TABS
ORAL_TABLET | ORAL | 0 refills | Status: AC
Start: 2023-11-03 — End: 2023-11-15

## 2023-11-03 MED ORDER — HYDROXYCHLOROQUINE SULFATE 200 MG PO TABS: 300.0000 mg | ORAL_TABLET | Freq: Every day | ORAL | 1 refills | Status: DC

## 2023-11-04 LAB — SEDIMENTATION RATE: Sed Rate: 9 mm/h (ref 0–30)

## 2023-11-04 LAB — C-REACTIVE PROTEIN: CRP: <3 mg/L (ref <8.0)

## 2023-11-07 ENCOUNTER — Other Ambulatory Visit (HOSPITAL_COMMUNITY): Payer: Self-pay

## 2023-11-07 ENCOUNTER — Encounter: Payer: Self-pay | Admitting: Cardiology

## 2023-11-07 ENCOUNTER — Ambulatory Visit: Payer: Self-pay | Attending: Cardiology | Admitting: Cardiology

## 2023-11-07 VITALS — BP 132/90 | HR 57 | Ht 63.0 in | Wt 170.4 lb

## 2023-11-07 DIAGNOSIS — I25118 Atherosclerotic heart disease of native coronary artery with other forms of angina pectoris: Secondary | ICD-10-CM | POA: Insufficient documentation

## 2023-11-07 DIAGNOSIS — I10 Essential (primary) hypertension: Secondary | ICD-10-CM | POA: Insufficient documentation

## 2023-11-07 DIAGNOSIS — E782 Mixed hyperlipidemia: Secondary | ICD-10-CM | POA: Insufficient documentation

## 2023-11-07 DIAGNOSIS — F17210 Nicotine dependence, cigarettes, uncomplicated: Secondary | ICD-10-CM | POA: Insufficient documentation

## 2023-11-07 MED ORDER — AMLODIPINE BESYLATE 2.5 MG PO TABS
2.5000 mg | ORAL_TABLET | Freq: Every day | ORAL | 1 refills | Status: DC
  Filled 2023-11-07: qty 30, 30d supply, fill #0

## 2023-11-07 NOTE — Patient Instructions (Signed)
 Medication Instructions:  STOP Plavix  STOP Metoprolol   START Amlodipine  2.5 mg daily   *If you need a refill on your cardiac medications before your next appointment, please call your pharmacy*    Follow-Up: At [facility], you and your health needs are our priority.  As part of our continuing mission to provide you with exceptional heart care, our providers are all part of one team.  This team includes your primary Cardiologist (physician) and Advanced Practice Providers or APPs (Physician Assistants and Nurse Practitioners) who all work together to provide you with the care you need, when you need it.  Your next appointment:   As needed   Provider:   Newman JINNY Lawrence, MD

## 2023-11-07 NOTE — Progress Notes (Signed)
 Cardiology Office Note:  .   Date:  11/07/2023  ID:  Angel Price, DOB May 12, 1965, MRN [id number] PCP: Angel Benita Area, MD  [facility] Providers Cardiologist:  Angel Lawrence, MD PCP: Angel Benita Area, MD  Chief Complaint  Patient presents with   Coronary Artery Disease     Angel Price is a 58 y.o. female with hypertension, mild nonobstructive CAD, nicotine  dependence  Patient is doing well.  She has not had any recurrent chest pain.  Pressure is elevated today.   Vitals:   11/07/23 1046  BP: (!) 132/90  Pulse: (!) 57  SpO2: 97%       Review of Systems  Cardiovascular:  Positive for chest pain. Negative for dyspnea on exertion, leg swelling, palpitations and syncope.  Neurological:  Positive for numbness (Left arm).        Studies Reviewed: SABRA        EKG 07/15/2023: Sinus rhythm with Premature supraventricular complexes Nonspecific ST and T wave abnormality When compared with ECG of 12-Jul-2023 12:28, Premature supraventricular complexes are now Present QT has shortened  Coronary angiography 08/2023:    Prox LAD to Mid LAD lesion is 20% stenosed.   Dist RCA lesion is 20% stenosed.   Prox RCA lesion is 30% stenosed.   Mild non-obstructive CAD Normal LV systolic function LVEDP 21 mmHg   Labs 08/2023: Chol 74, TG 51, HDL 31, LDL 33 Hb 12.5 Cr 0.4    Labs 02/2023-03/2023: Chol 219, TG 111, HDL 57, LDL 139 HbA1C 5.1% Hb 11.5 Cr 0.59 TSH 0.9  CTA chest 06/2023: 1. No aortic aneurysm or dissection. 2. Mildly enlarged central pulmonary arteries, suggesting pulmonary arterial hypertension. 3. Stable left adrenal adenoma. 4. Minimal colonic diverticulosis without evidence of diverticulitis. 5.  Calcific coronary artery and aortic atherosclerosis.  Echocardiogram 02/2023:  1. Left ventricular ejection fraction, by estimation, is 60 to 65%. The  left ventricle has normal function. The left ventricle has no regional   wall motion abnormalities. Left ventricular diastolic parameters were  normal.   2. Right ventricular systolic function is normal. The right ventricular  size is normal. There is normal pulmonary artery systolic pressure.   3. The mitral valve is abnormal. Trivial mitral valve regurgitation. No  evidence of mitral stenosis.   4. The aortic valve is tricuspid. Aortic valve regurgitation is not  visualized. No aortic stenosis is present.   5. Aortic dilatation noted. There is mild dilatation of the aortic root,  measuring 38 mm.   6. The inferior vena cava is normal in size with greater than 50%  respiratory variability, suggesting right atrial pressure of 3 mmHg.     Physical Exam Vitals and nursing note reviewed.  Constitutional:      General: She is not in acute distress. Neck:     Vascular: No JVD.  Cardiovascular:     Rate and Rhythm: Normal rate and regular rhythm.     Heart sounds: Normal heart sounds. No murmur heard. Pulmonary:     Effort: Pulmonary effort is normal.     Breath sounds: Normal breath sounds. No wheezing or rales.  Musculoskeletal:     Right lower leg: No edema.     Left lower leg: No edema.      VISIT DIAGNOSES:   ICD-10-CM   1. Coronary artery disease of native artery of native heart with stable angina pectoris (HCC)  I25.118     2. Essential hypertension  I10     3.  Cigarette nicotine  dependence without complication  F17.210     4. Mixed hyperlipidemia  E78.2         Angel Price is a 59 y.o. female with hypertension, mild nonobstructive CAD, nicotine  dependence Assessment & Plan  Chest pain: Nonobstructive CAD (Cath 08/2023) Chest pain currently resolved. Continue aspirin  81 mg daily, lipids not very well-controlled statin.  Continue same. See below regarding blood pressure management. Emphasized portance of smoking cessation, patient is not interested at this time.  Hypertension: Suboptimal, especially elevated diastolic blood  pressure. Resume amlodipine  at 2.5 mg daily. Continue Adderall 50 mg daily.    F/u as needed   Signed, Angel JINNY Lawrence, MD

## 2023-11-14 LAB — LIPID PANEL
Cholesterol: 153 mg/dL (ref <200)
HDL: 40 mg/dL — ABNORMAL LOW (ref >=50)
LDL Cholesterol (Calc): 93 mg/dL
Non-HDL Cholesterol (Calc): 113 mg/dL (ref <130)
Total CHOL/HDL Ratio: 3.8 (calc) (ref <5.0)
Triglycerides: 107 mg/dL (ref <150)

## 2023-11-14 LAB — HEPATIC FUNCTION PANEL
AG Ratio: 1.6 (calc) (ref 1.0–2.5)
ALT: 21 U/L (ref 6–29)
AST: 19 U/L (ref 10–35)
Albumin: 3.8 g/dL (ref 3.6–5.1)
Alkaline phosphatase (APISO): 81 U/L (ref 37–153)
Bilirubin, Direct: 0.1 mg/dL (ref 0.0–0.2)
Globulin: 2.4 g/dL (ref 1.9–3.7)
Indirect Bilirubin: 0.3 mg/dL (ref 0.2–1.2)
Total Bilirubin: 0.4 mg/dL (ref 0.2–1.2)
Total Protein: 6.2 g/dL (ref 6.1–8.1)

## 2023-11-18 ENCOUNTER — Ambulatory Visit (INDEPENDENT_AMBULATORY_CARE_PROVIDER_SITE_OTHER): Admitting: Physical Medicine and Rehabilitation

## 2023-11-18 DIAGNOSIS — R202 Paresthesia of skin: Secondary | ICD-10-CM

## 2023-11-18 DIAGNOSIS — M79642 Pain in left hand: Secondary | ICD-10-CM | POA: Diagnosis not present

## 2023-11-18 DIAGNOSIS — G8194 Hemiplegia, unspecified affecting left nondominant side: Secondary | ICD-10-CM

## 2023-11-18 DIAGNOSIS — R29898 Other symptoms and signs involving the musculoskeletal system: Secondary | ICD-10-CM

## 2023-11-18 DIAGNOSIS — G8929 Other chronic pain: Secondary | ICD-10-CM

## 2023-11-18 DIAGNOSIS — M25512 Pain in left shoulder: Secondary | ICD-10-CM | POA: Diagnosis not present

## 2023-11-18 NOTE — Progress Notes (Signed)
 Angel Price - 58 y.o. female MRN [id number]  Date of birth: Jan 21, 1966  Office Visit Note: Visit Date: 11/18/2023 PCP: Carlette Benita Area, MD Referred by: Jeannetta Lonni ORN, MD  Subjective: Chief Complaint  Patient presents with   Left Hand - Pain, Numbness, Weakness   HPI: Angel Price is a 58 y.o. female who comes in today at the request of Dr. Lonni Jeannetta for evaluation and management of chronic, worsening and severe pain, numbness and tingling in the Left upper extremities.  Patient is Left hand dominant.  She reports years of off-and-on symptoms but specifically for the last 2 months she has had worsening left hand numbness tingling and weakness.  It is fairly global but is more problem at night with nocturnal complaints.  Positive flick sign.  She has a history of chronic neck and back pain and a complicated history of prior stroke with left-sided hemiparesis.  She is also complicated history of rheumatoid arthritis.  No significant known diabetes.  She is not getting symptoms down the arm but does have chronic left shoulder pain.  Symptoms seem to arise from the hand and go up to the elbow.  She reports a lot of weakness with grip and trying to manipulate fine objects.  No real right-sided complaints.   I spent more than 30 minutes speaking face-to-face with the patient with 50% of the time in counseling and discussing coordination of care.       Review of Systems  Musculoskeletal:  Positive for back pain, joint pain and neck pain.  Neurological:  Positive for tingling and focal weakness.  All other systems reviewed and are negative.  Otherwise per HPI.  Assessment & Plan: Visit Diagnoses:    ICD-10-CM   1. Paresthesia of skin  R20.2 NCV with EMG (electromyography)    2. Chronic left shoulder pain  M25.512    G89.29     3. Left hand weakness  R29.898     4. Pain in left hand  M79.642     5. Left hemiparesis (HCC)  G81.94        Plan:  Impression: Medically complicated patient with history of chronic pain syndrome who is in chronic pain management for neck and back pain with left-sided hemiparesis history of stroke now with onset of significant numbness and tingling in the left hand.  Likely some underlying carpal tunnel syndrome.  The above electrodiagnostic study is ABNORMAL and reveals evidence of a moderate to severe left median nerve entrapment at the wrist (carpal tunnel syndrome) affecting sensory and motor components.   There is no significant electrodiagnostic evidence of any other focal nerve entrapment, brachial plexopathy or cervical radiculopathy.   Recommendations: 1.  Follow-up with referring physician. 2.  Continue current management of symptoms. 3.  Continue use of resting splint at night-time and as needed during the day. 4.  Suggest surgical evaluation.  Meds & Orders: No orders of the defined types were placed in this encounter.   Orders Placed This Encounter  Procedures   NCV with EMG (electromyography)    Follow-up: Return for Lonni Jeannetta, MD.   Procedures: No procedures performed  EMG & NCV Findings: Evaluation of the left median motor nerve showed prolonged distal onset latency (4.3 ms), reduced amplitude (4.1 mV), and decreased conduction velocity (Elbow-Wrist, 48 m/s).  The left median (across palm) sensory nerve showed prolonged distal peak latency (Wrist, 4.3 ms) and prolonged distal peak latency (Palm, 2.1 ms).  The left ulnar sensory nerve  showed prolonged distal peak latency (3.8 ms) and decreased conduction velocity (Wrist-5th Digit, 37 m/s).  All remaining nerves (as indicated in the following tables) were within normal limits.    All examined muscles (as indicated in the following table) showed no evidence of electrical instability.    Impression: The above electrodiagnostic study is ABNORMAL and reveals evidence of a moderate to severe left median nerve entrapment at the wrist  (carpal tunnel syndrome) affecting sensory and motor components.   There is no significant electrodiagnostic evidence of any other focal nerve entrapment, brachial plexopathy or cervical radiculopathy.   Recommendations: 1.  Follow-up with referring physician. 2.  Continue current management of symptoms. 3.  Continue use of resting splint at night-time and as needed during the day. 4.  Suggest surgical evaluation.  ___________________________ Prentice Masters FAAPMR Board Certified, American Board of Physical Medicine and Rehabilitation    Nerve Conduction Studies Anti Sensory Summary Table   Stim Site NR Peak (ms) Norm Peak (ms) P-T Amp (V) Norm P-T Amp Site1 Site2 Delta-P (ms) Dist (cm) Vel (m/s) Norm Vel (m/s)  Left Median Acr Palm Anti Sensory (2nd Digit)  29.3C  Wrist    *4.3 <3.6 19.6 >10 Wrist Palm 2.2 0.0    Palm    *2.1 <2.0 1.7         Left Radial Anti Sensory (Base 1st Digit)  29.4C  Wrist    2.3 <3.1 41.8  Wrist Base 1st Digit 2.3 0.0    Left Ulnar Anti Sensory (5th Digit)  29.7C  Wrist    *3.8 <3.7 20.2 >15.0 Wrist 5th Digit 3.8 14.0 *37 >38   Motor Summary Table   Stim Site NR Onset (ms) Norm Onset (ms) O-P Amp (mV) Norm O-P Amp Site1 Site2 Delta-0 (ms) Dist (cm) Vel (m/s) Norm Vel (m/s)  Left Median Motor (Abd Poll Brev)  29.3C  Wrist    *4.3 <4.2 *4.1 >5 Elbow Wrist 4.5 21.7 *48 >50  Elbow    8.8  3.3         Left Ulnar Motor (Abd Dig Min)  29.3C  Wrist    3.1 <4.2 9.3 >3 B Elbow Wrist 3.2 18.5 58 >53  B Elbow    6.3  9.1  A Elbow B Elbow 1.5 10.0 67 >53  A Elbow    7.8  8.8          EMG   Side Muscle Nerve Root Ins Act Fibs Psw Amp Dur Poly Recrt Int Bruna Comment  Left Abd Poll Brev Median C8-T1 Nml Nml Nml Nml Nml 0 Nml Nml   Left 1stDorInt Ulnar C8-T1 Nml Nml Nml Nml Nml 0 Nml Nml   Left PronatorTeres Median C6-7 Nml Nml Nml Nml Nml 0 Nml Nml   Left Biceps Musculocut C5-6 Nml Nml Nml Nml Nml 0 Nml Nml   Left Deltoid Axillary C5-6 Nml Nml Nml Nml Nml 0  Nml Nml     Nerve Conduction Studies Anti Sensory Left/Right Comparison   Stim Site L Lat (ms) R Lat (ms) L-R Lat (ms) L Amp (V) R Amp (V) L-R Amp (%) Site1 Site2 L Vel (m/s) R Vel (m/s) L-R Vel (m/s)  Median Acr Palm Anti Sensory (2nd Digit)  29.3C  Wrist *4.3   19.6   Wrist Palm     Palm *2.1   1.7         Radial Anti Sensory (Base 1st Digit)  29.4C  Wrist 2.3   41.8   Wrist  Base 1st Digit     Ulnar Anti Sensory (5th Digit)  29.7C  Wrist *3.8   20.2   Wrist 5th Digit *37     Motor Left/Right Comparison   Stim Site L Lat (ms) R Lat (ms) L-R Lat (ms) L Amp (mV) R Amp (mV) L-R Amp (%) Site1 Site2 L Vel (m/s) R Vel (m/s) L-R Vel (m/s)  Median Motor (Abd Poll Brev)  29.3C  Wrist *4.3   *4.1   Elbow Wrist *48    Elbow 8.8   3.3         Ulnar Motor (Abd Dig Min)  29.3C  Wrist 3.1   9.3   B Elbow Wrist 58    B Elbow 6.3   9.1   A Elbow B Elbow 67    A Elbow 7.8   8.8            Waveforms:            Clinical History: No specialty comments available.   She reports that she has been smoking cigarettes. She has a 8.8 pack-year smoking history. She has been exposed to tobacco smoke. She has never used smokeless tobacco.  Recent Labs    03/15/23 0425  HGBA1C 5.1    Objective:  VS:  HT:    WT:   BMI:     BP:   HR: bpm  TEMP: ( )  RESP:  Physical Exam Vitals and nursing note reviewed.  Constitutional:      General: She is not in acute distress.    Appearance: Normal appearance. She is well-developed. She is obese. She is not ill-appearing.  HENT:     Head: Normocephalic and atraumatic.  Eyes:     Conjunctiva/sclera: Conjunctivae normal.     Pupils: Pupils are equal, round, and reactive to light.  Cardiovascular:     Rate and Rhythm: Normal rate.     Pulses: Normal pulses.  Pulmonary:     Effort: Pulmonary effort is normal.  Musculoskeletal:        General: Tenderness present. No swelling or deformity.     Right lower leg: No edema.     Left lower leg: No  edema.     Comments: Inspection reveals flattening of the left APB but no atrophy of the bilateral FDI or hand intrinsics. There is no swelling, color changes, allodynia or dystrophic changes. There is 5 out of 5 strength in the bilateral wrist extension, finger abduction and long finger flexion.  There is decreased sensation to light touch in the median nerve distribution on the left. There is a negative Froment's test bilaterally. There is a negative Tinel's test at the bilateral wrist and elbow. There is a positive Phalen's test on the left. There is a negative Hoffmann's test bilaterally.  Skin:    General: Skin is warm and dry.     Findings: No erythema or rash.  Neurological:     General: No focal deficit present.     Mental Status: She is alert and oriented to person, place, and time.     Cranial Nerves: No cranial nerve deficit.     Sensory: Sensory deficit present.     Motor: No weakness or abnormal muscle tone.     Coordination: Coordination normal.     Gait: Gait normal.  Psychiatric:        Mood and Affect: Mood normal.        Behavior: Behavior normal.     Ortho Exam  Imaging: No results found.  Past Medical/Family/Surgical/Social History: Medications & Allergies reviewed per EMR, new medications updated. Patient Active Problem List   Diagnosis Date Noted   Chest pain 08/21/2023   Dyslipidemia 08/21/2023   Rectal pressure 08/05/2023   Abdominal bloating 08/05/2023   Anemia 08/05/2023   High risk medication use 05/05/2023   Unilateral primary osteoarthritis, left knee 05/05/2023   Seropositive rheumatoid arthritis (HCC) 05/02/2022   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   Cellulitis 10/06/2021   Paroxysmal atrial fibrillation (HCC) 10/06/2021   Abdominal fluid collection 10/06/2021   Constipation 09/14/2021   Ventral hernia 05/15/2021   Dysphagia 05/15/2021   Cigarette nicotine  dependence without complication 09/12/2020   Continuous dependence on  cigarette smoking 08/22/2020   Mixed hyperlipidemia 04/27/2020   Recurrent incisional hernia 02/03/2020   Bilateral knee pain 12/29/2019   Coronary artery disease of native artery of native heart with stable angina pectoris (HCC) 06/25/2019   Acute respiratory failure with hypoxia (HCC) 05/20/2019   Essential hypertension 05/20/2019   COVID-19 05/20/2019   Syncope and collapse 05/20/2019   Metabolic acidosis 05/16/2019   Intractable vomiting with nausea 02/23/2019   Cocaine abuse (HCC)    Colitis 11/11/2018   Hemorrhoidal skin tag    Folate deficiency 05/11/2017   Abdominal wall fistula    Cellulitis of abdominal wall 01/02/2017   Acute left hemiparesis (HCC) 12/05/2016   Malingering 12/05/2016   Weakness of left lower extremity 12/02/2016   CVA (cerebral vascular accident) (HCC) 11/23/2016   Bright red rectal bleeding 08/22/2016   Hypotension due to blood loss    Acute GI bleeding 05/24/2016   Dieulafoy lesion of duodenum    History of Billroth II operation    Gastrointestinal hemorrhage 05/22/2016   Absolute anemia    Acute pancreatitis 10/01/2015   Alcohol abuse with intoxication (HCC)    Left-sided weakness    Psychosomatic factor in physical condition    Upper GI bleed    Diverticulosis of colon with hemorrhage    Chronic wound infection of abdomen 12/22/2014   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   HTN (hypertension) 12/22/2014   Ataxia 11/01/2014   Hemorrhoids 04/26/2014   Lung nodule 04/26/2014   Diverticulosis    Gastritis    Hiatal hernia    Schatzki's ring    Acute blood loss anemia 04/25/2014   Hypokalemia 04/25/2014   Hematemesis with nausea 04/25/2014   Lung nodule < 6cm on CT 04/25/2014   Intractable nausea and vomiting 04/24/2014   Gastroparesis    Abdominal pain 04/01/2014   Gastroenteritis 12/10/2013   Chronic abdominal wound infection 08/16/2013   MDD (major depressive disorder), recurrent episode, severe (HCC) 06/27/2013   Wrist laceration 06/24/2013    Diarrhea 05/07/2013   Rectal bleeding 05/07/2013   Adrenal mass, left (HCC) 05/07/2013   Post-operative wound abscess 04/19/2013   Abdominal wall abscess 04/18/2013   Essential hypertension, benign 03/30/2013   History of cocaine abuse (HCC) 03/16/2013   History of schizoaffective disorder 03/16/2013   Bipolar disorder (HCC) 03/16/2013   Personality disorder (HCC) 03/16/2013   Tobacco use disorder 03/16/2013   Alcohol abuse 03/16/2013   Palpitations 03/16/2013   Poor vision 03/16/2013   History of gastric bypass 03/16/2013   Status post hysterectomy with oophorectomy 03/16/2013   Hypothyroid 03/16/2013   SLE (systemic lupus erythematosus) (HCC) 03/16/2013   Past Medical History:  Diagnosis Date   Abscess    soft tissue   Adrenal mass (HCC)    Alcohol abuse    Anemia  Anxiety    Blood transfusion without reported diagnosis    Chronic abdominal pain    Chronic wound infection of abdomen    Colon polyp    colonoscopy 04/2014   Depression    Discoid lupus erythematosus    Diverticulosis    colonoscopy 04/2014 moderat pan colonic   Drug-seeking behavior    Gastritis    EGD 05/2014   Gastroparesis 01/2014   GERD (gastroesophageal reflux disease)    Hemorrhoid    internal large   Hiatal hernia    History of Billroth II operation    Hypertension    Lung nodule    CT 02/2014 needs repeat 1 month   Lung nodule < 6cm on CT 04/25/2014   Lupus    Malingering    Migraine    Nausea and vomiting    chronic, recurrent   Pancreatitis    Schatzki's ring    patent per EGD 04/2014   Sickle cell trait (HCC)    Stroke (HCC)    Suicide attempt (HCC)    Thyroid  disease 2000   overactive, radiation   Family History  Problem Relation Age of Onset   Drug abuse Mother    Lung cancer Father    Cancer Father        mets   Drug abuse Sister    Drug abuse Brother    Breast cancer Maternal Aunt    Bipolar disorder Maternal Aunt    Drug abuse Maternal Aunt    Colon cancer  Maternal Grandmother        late 80s, early 8s   Bipolar disorder Paternal Grandfather    Brain cancer Son    Schizophrenia Son    Cancer Son        brain   Bipolar disorder Cousin    Liver disease Neg Hx    Past Surgical History:  Procedure Laterality Date   ABDOMINAL HYSTERECTOMY  2013   [place]   ABDOMINAL HYSTERECTOMY     ABDOMINAL SURGERY     ADRENALECTOMY Right    AGILE CAPSULE N/A 01/05/2015   Procedure: AGILE CAPSULE;  Surgeon: Lamar CHRISTELLA Hollingshead, MD;  [facility]: [facility];  Service: Endoscopy;  Laterality: N/A;  0700   BALLOON DILATION N/A 10/01/2021   Procedure: BALLOON DILATION;  Surgeon: Cindie Carlin POUR, DO;  [facility]: [facility];  Service: Endoscopy;  Laterality: N/A;   Billroth II procedure      [place], [place] 2000, 2005/2006.   bilroth 2     BIOPSY  05/20/2013   Procedure: BIOPSIES OF ASCENDING AND SIGMOID COLON;  Surgeon: Lamar CHRISTELLA Hollingshead, MD;  Location: [facility];  Service: Endoscopy;;   BIOPSY  04/26/2014   Procedure: BIOPSIES;  Surgeon: Margo LITTIE Haddock, MD;  Location: [facility];  Service: Endoscopy;;   BIOPSY  12/24/2018   Procedure: BIOPSY;  Surgeon: Hollingshead Lamar CHRISTELLA, MD;  [facility]: [facility];  Service: Endoscopy;;  colon   CHOLECYSTECTOMY     COLONOSCOPY     in [place]   COLONOSCOPY WITH PROPOFOL  N/A 05/20/2013   Dr.Rourk- inadequate prep, normal appearing rectum, grossly normal colon aside from pancolonic diverticula, normal terminal ileum bx= unremarkable colonic mucosa. Due for early interval 2016.    COLONOSCOPY WITH PROPOFOL  N/A 04/26/2014   DOQ:wnmfjo ileum/one colon polyp removed/moderate pan-colonic diverticulosis/large internal hemorrhoids   COLONOSCOPY WITH PROPOFOL  N/A 12/23/2014   Dr.Rourk- minimal internal hemorrhoids, pancolonic diverticulosis   COLONOSCOPY WITH PROPOFOL  N/A 08/23/2016   Procedure: COLONOSCOPY WITH PROPOFOL ;  Surgeon: Harvey Margo CROME, MD;  [facility];  Service: Endoscopy;  Laterality: N/A;   COLONOSCOPY WITH  PROPOFOL  N/A 12/24/2018   Pancolonic diverticulosis, normal TI, one 5 mm polyp in rectum (tubular adenoma). Somewhat friable hemorrhagic mucosa in area of IC valve/cecum, possibly related to scope trauma s/p biopsy. Repeat in 7 years.   DEBRIDEMENT OF ABDOMINAL WALL ABSCESS N/A 02/08/2013   Procedure: DEBRIDEMENT OF ABDOMINAL WALL ABSCESS;  Surgeon: Oneil DELENA Budge, MD;  Location: [facility];  Service: General;  Laterality: N/A;   ESOPHAGOGASTRODUODENOSCOPY (EGD) WITH PROPOFOL  N/A 05/20/2013   Dr.Rourk- s/p prior gastric surgery with normal esophagus, residual gastric mucosa and patent efferent limb   ESOPHAGOGASTRODUODENOSCOPY (EGD) WITH PROPOFOL  N/A 02/03/2014   Dr. Shaaron:  s/p hemigastrectomy with retained gastric contents. Residual gastric mucosa and efferent limb appeared normal otherwise. Query gastroparesis.    ESOPHAGOGASTRODUODENOSCOPY (EGD) WITH PROPOFOL  N/A 04/26/2014   DOQ:dryjusxp'd ring/small HH/mild non-erosive gasrtitis/normal anastomosis   ESOPHAGOGASTRODUODENOSCOPY (EGD) WITH PROPOFOL  N/A 12/23/2014   Dr.Rourk- s/p prior hemigastrctomy, active oozing from anastomotic suture site, hemostasis achieved   ESOPHAGOGASTRODUODENOSCOPY (EGD) WITH PROPOFOL  N/A 05/23/2016   Dr. 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    ESOPHAGOGASTRODUODENOSCOPY (EGD) WITH PROPOFOL  N/A 05/11/2017   Procedure: ESOPHAGOGASTRODUODENOSCOPY (EGD) WITH PROPOFOL ;  Surgeon: Shaaron Lamar HERO, MD;  [facility];  Service: Endoscopy;  Laterality: N/A;   ESOPHAGOGASTRODUODENOSCOPY (EGD) WITH PROPOFOL  N/A 06/07/2021   Procedure: ESOPHAGOGASTRODUODENOSCOPY (EGD) WITH PROPOFOL ;  Surgeon: Shaaron Lamar HERO, MD;  [facility];  Service: Endoscopy;  Laterality: N/A;  11:45am   ESOPHAGOGASTRODUODENOSCOPY (EGD) WITH PROPOFOL  N/A 10/01/2021   Procedure: ESOPHAGOGASTRODUODENOSCOPY (EGD) WITH PROPOFOL ;  Surgeon: Cindie Carlin POUR, DO;  [facility];  Service: Endoscopy;  Laterality: N/A;  3:00pm   EXCISION OF MESH  08/2019   HEMORRHOID SURGERY N/A 06/18/2017   Procedure: THREE COLUMN EXTENSIVE HEMORRHOIDECTOMY;  Surgeon: Kallie Manuelita BROCKS, MD;  Location: [facility];  Service: General;  Laterality: N/A;   HERNIA REPAIR     INCISION AND DRAINAGE ABSCESS N/A 01/06/2017   Procedure: INCISION AND DRAINAGE ABDOMINAL WALL ABSCESS;  Surgeon: Budge Oneil, MD;  Location: [facility];  Service: General;  Laterality: N/A;   incisional hernial repair  09/05/2021   [place]   LEFT HEART CATH AND CORONARY ANGIOGRAPHY N/A 05/09/2020   Procedure: LEFT HEART CATH AND CORONARY ANGIOGRAPHY;  Surgeon: Elmira Newman PARAS, MD;  Location: [facility];  Service: Cardiovascular;  Laterality: N/A;   LEFT HEART CATH AND CORONARY ANGIOGRAPHY N/A 08/22/2023   Procedure: LEFT HEART CATH AND CORONARY ANGIOGRAPHY;  Surgeon: Verlin Lonni BIRCH, MD;  Location: [facility];  Service: Cardiovascular;  Laterality: N/A;   POLYPECTOMY  12/24/2018   Procedure: POLYPECTOMY;  Surgeon: Shaaron Lamar HERO, MD;  [facility];  Service: Endoscopy;;  colon   tendon repar Right    wrist   WOUND EXPLORATION Right 06/24/2013   Procedure: exploration of traumatic wound right wrist;  Surgeon: Franky JONELLE Curia, MD;  Location: [facility];  Service: Orthopedics;  Laterality: Right;   Social History   Occupational History   Not on file  Tobacco Use   Smoking status: Every Day    Current packs/day: 0.25    Average packs/day: 0.3 packs/day for 35.0 years (8.8 ttl pk-yrs)    Types: Cigarettes    Passive exposure: Past   Smokeless tobacco: Never  Vaping Use  Vaping status: Never Used  Substance and Sexual Activity   Alcohol use: Not Currently   Drug use: Not Currently    Types: Cocaine    Comment: no cocaine in4  years per patient (08/20/21)   Sexual activity: Not Currently    Birth control/protection: Surgical

## 2023-11-18 NOTE — Progress Notes (Signed)
 Pain Scale   Average Pain 6 Patient advising she has pain, numbness-tingling and weakness in her left hand and pain increases at night. Patient is Left hand dominate.        +Driver, -BT, -Dye Allergies.

## 2023-11-19 NOTE — Procedures (Signed)
 EMG & NCV Findings: Evaluation of the left median motor nerve showed prolonged distal onset latency (4.3 ms), reduced amplitude (4.1 mV), and decreased conduction velocity (Elbow-Wrist, 48 m/s).  The left median (across palm) sensory nerve showed prolonged distal peak latency (Wrist, 4.3 ms) and prolonged distal peak latency (Palm, 2.1 ms).  The left ulnar sensory nerve showed prolonged distal peak latency (3.8 ms) and decreased conduction velocity (Wrist-5th Digit, 37 m/s).  All remaining nerves (as indicated in the following tables) were within normal limits.    All examined muscles (as indicated in the following table) showed no evidence of electrical instability.    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   There is no significant electrodiagnostic evidence of any other focal nerve entrapment, brachial plexopathy or cervical radiculopathy.   Recommendations: 1.  Follow-up with referring physician. 2.  Continue current management of symptoms. 3.  Continue use of resting splint at night-time and as needed during the day. 4.  Suggest surgical evaluation.  ___________________________ Prentice Masters FAAPMR Board Certified, American Board of Physical Medicine and Rehabilitation    Nerve Conduction Studies Anti Sensory Summary Table   Stim Site NR Peak (ms) Norm Peak (ms) P-T Amp (V) Norm P-T Amp Site1 Site2 Delta-P (ms) Dist (cm) Vel (m/s) Norm Vel (m/s)  Left Median Acr Palm Anti Sensory (2nd Digit)  29.3C  Wrist    *4.3 <3.6 19.6 >10 Wrist Palm 2.2 0.0    Palm    *2.1 <2.0 1.7         Left Radial Anti Sensory (Base 1st Digit)  29.4C  Wrist    2.3 <3.1 41.8  Wrist Base 1st Digit 2.3 0.0    Left Ulnar Anti Sensory (5th Digit)  29.7C  Wrist    *3.8 <3.7 20.2 >15.0 Wrist 5th Digit 3.8 14.0 *37 >38   Motor Summary  Table   Stim Site NR Onset (ms) Norm Onset (ms) O-P Amp (mV) Norm O-P Amp Site1 Site2 Delta-0 (ms) Dist (cm) Vel (m/s) Norm Vel (m/s)  Left Median Motor (Abd Poll Brev)  29.3C  Wrist    *4.3 <4.2 *4.1 >5 Elbow Wrist 4.5 21.7 *48 >50  Elbow    8.8  3.3         Left Ulnar Motor (Abd Dig Min)  29.3C  Wrist    3.1 <4.2 9.3 >3 B Elbow Wrist 3.2 18.5 58 >53  B Elbow    6.3  9.1  A Elbow B Elbow 1.5 10.0 67 >53  A Elbow    7.8  8.8          EMG   Side Muscle Nerve Root Ins Act Fibs Psw Amp Dur Poly Recrt Int Bruna Comment  Left Abd Poll Brev Median C8-T1 Nml Nml Nml Nml Nml 0 Nml Nml   Left 1stDorInt Ulnar C8-T1 Nml Nml Nml Nml Nml 0 Nml Nml   Left PronatorTeres Median C6-7 Nml Nml Nml Nml Nml 0 Nml Nml   Left Biceps Musculocut C5-6 Nml Nml Nml Nml Nml 0 Nml Nml   Left Deltoid Axillary C5-6 Nml Nml Nml Nml Nml 0 Nml Nml     Nerve Conduction Studies Anti Sensory Left/Right Comparison   Stim Site L Lat (ms) R Lat (ms) L-R Lat (ms) L Amp (V) R Amp (V) L-R Amp (%) Site1 Site2 L Vel (m/s) R Vel (  m/s) L-R Vel (m/s)  Median Acr Palm Anti Sensory (2nd Digit)  29.3C  Wrist *4.3   19.6   Wrist Palm     Palm *2.1   1.7         Radial Anti Sensory (Base 1st Digit)  29.4C  Wrist 2.3   41.8   Wrist Base 1st Digit     Ulnar Anti Sensory (5th Digit)  29.7C  Wrist *3.8   20.2   Wrist 5th Digit *37     Motor Left/Right Comparison   Stim Site L Lat (ms) R Lat (ms) L-R Lat (ms) L Amp (mV) R Amp (mV) L-R Amp (%) Site1 Site2 L Vel (m/s) R Vel (m/s) L-R Vel (m/s)  Median Motor (Abd Poll Brev)  29.3C  Wrist *4.3   *4.1   Elbow Wrist *48    Elbow 8.8   3.3         Ulnar Motor (Abd Dig Min)  29.3C  Wrist 3.1   9.3   B Elbow Wrist 58    B Elbow 6.3   9.1   A Elbow B Elbow 67    A Elbow 7.8   8.8            Waveforms:

## 2023-11-20 ENCOUNTER — Telehealth: Payer: Self-pay | Admitting: *Deleted

## 2023-11-20 DIAGNOSIS — G5603 Carpal tunnel syndrome, bilateral upper limbs: Secondary | ICD-10-CM

## 2023-11-20 DIAGNOSIS — M25532 Pain in left wrist: Secondary | ICD-10-CM

## 2023-11-20 NOTE — Telephone Encounter (Signed)
 Patient would like to results for her nerve conduction study.

## 2023-11-21 NOTE — Telephone Encounter (Signed)
 Contacted the patient to advise that Her nerve conduction study shows a moderate to severe carpal tunnel syndrome in her left wrist.  This would account for a lot of pain and difficulty in her left hand.  Usually with severe nerve impingement the best option is to see an orthopedic specialist to discuss the option for surgery to release it because conservative treatment like wrist splints and injections are most often very temporary or inadequate.   Patient verbalized understanding and is interested in seeing the orthopedic specialist to discuss the option of surgery. If a referral is replaced she would like to be in [place].

## 2023-11-21 NOTE — Telephone Encounter (Signed)
 Her nerve conduction study shows a moderate to severe carpal tunnel syndrome in her left wrist.  This would account for a lot of pain and difficulty in her left hand.  Usually with severe nerve impingement the best option is to see an orthopedic specialist to discuss the option for surgery to release it because conservative treatment like wrist splints and injections are most often very temporary or inadequate.

## 2023-11-24 ENCOUNTER — Encounter: Payer: Self-pay | Admitting: Physical Medicine and Rehabilitation

## 2023-11-25 ENCOUNTER — Ambulatory Visit: Payer: Self-pay | Admitting: Internal Medicine

## 2023-11-25 NOTE — Addendum Note (Signed)
 Addended by: Trixy Loyola M on: 11/25/2023 11:31 AM   Modules accepted: Orders

## 2023-11-25 NOTE — Progress Notes (Signed)
 Her lab results all look good. Shoulder xray does not show a lot of osteoarthritis damage so suspect more bursitis or tendinopathy. Hopefully this also improves with the exercises and short terms steroids.

## 2023-11-26 ENCOUNTER — Ambulatory Visit (INDEPENDENT_AMBULATORY_CARE_PROVIDER_SITE_OTHER): Admitting: Gastroenterology

## 2023-11-26 ENCOUNTER — Encounter: Payer: Self-pay | Admitting: Gastroenterology

## 2023-11-26 VITALS — BP 123/87 | HR 102 | Temp 98.8°F | Ht 63.0 in | Wt 167.0 lb

## 2023-11-26 DIAGNOSIS — R198 Other specified symptoms and signs involving the digestive system and abdomen: Secondary | ICD-10-CM | POA: Diagnosis not present

## 2023-11-26 DIAGNOSIS — E538 Deficiency of other specified B group vitamins: Secondary | ICD-10-CM | POA: Insufficient documentation

## 2023-11-26 DIAGNOSIS — E611 Iron deficiency: Secondary | ICD-10-CM | POA: Insufficient documentation

## 2023-11-26 MED ORDER — B-12 1000 MCG SL SUBL: 1000.0000 ug | SUBLINGUAL_TABLET | Freq: Every day | SUBLINGUAL | Status: AC

## 2023-11-26 NOTE — Progress Notes (Signed)
 GI Office Note    Referring Provider: Carlette Benita Area* Primary Care Physician:  Fanta, Tesfaye Demissie, MD  Primary Gastroenterologist: Ozell Hollingshead, MD   Chief Complaint   Chief Complaint  Patient presents with   Follow-up    Still having issues with rectal pressure    History of Present Illness   Angel Price is a 58 y.o. female presenting today for follow up.  Patient last seen May 2025.  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   At last visit she was experiencing generalized abdominal pain and bloating in the setting of constipation, noted some mild anemia with low normal B12, esophageal dysphagia planned for barium pill esophagram, provided Anusol  for suspected hemorrhoid flare.  Patient did not complete labs as recommended.  Since we last saw her she was hospitalized in June of this year with chest pain.  She had atypical chest pain, outpatient stress test showed abnormality suggestive of significant coronary artery disease.  Decision was made to pursue inpatient left heart cath showed nonobstructive coronary artery disease.  Discussed the use of AI scribe software for clinical note transcription with the patient, who gave verbal consent to proceed.   She has experienced significant rectal pressure for nearly a year, described as a persistent sensation in the rectum as if she needs to have a stool. States she has a daily BM but never gets relief of the rectal pressure. She sometimes sits on hard surfaces to alleviate the 'pulsating' pressure. No blood in stool is noted.  She has a history of low B12 and iron levels, with B12 previously on the low end of  normal. Recently B12 and ferritin checked in June. Her B12 was very low and ferritin low. She is not on B12 or iron supplements. She states she used to get B12 shots years ago. She chronically takes B1 supplements. In November, it will be one year since she quit all etoh.   She denies abdominal pain, n/v. No heartburn or dysphagia. No melena, brbpr. She states she has back issues and does she a provider for this.   She quit alcohol nearly a year ago and stopped using cocaine five years ago. She currently smokes cigarettes.       Prior Data   Labs from November 14, 2023: Albumin 3.8, total bilirubin 0.4, alk phos 81, AST 19, ALT 21  Labs from September 09, 2023: Hemoglobin 12.5, hematocrit 34.8, MCV 74.7, platelets 364, white blood cell count 10.6  Labs from August 22, 2023:creatinine 0.4, Ferritin 9 L, iron 53, iron sat 12%, TIBC 439, folate 19.6, B12 126 low,   BPE May 2025: Multiple surgical clips noted in the area of the suppose of gastric remnant, normal esophagus, no hiatal hernia, no GERD visualized, 13 mm barium tablet passed normally.  CT A/P with contrast 03/2023 IMPRESSION: 1. No acute abnormality in the abdomen or pelvis. 2. Surgical changes of prior gastric bypass, cholecystectomy, right adrenalectomy and hysterectomy. 3. Stable 18 mm left adrenal nodule previously characterized as a benign adenoma on noncontrast enhanced examination dated February 21, 2023 this requires no independent imaging follow-up. 4.  Aortic Atherosclerosis (ICD10-I70.0).  EGD 10/01/2021: Mild Schatzki's ring dilated, small amount of food residue in the stomach, otherwise  normal exam.   Last colonoscopy 12/24/2018: Diverticulosis in the entire colon, normal TI, 5 mm polyp resected and retrieved, somewhat friable hemorrhagic mucosa in the area of the ileocecal valve/cecum which may be artifact related to scope trauma s/p biopsy.  Polyp was a tubular adenoma.  Colon biopsy was benign.  Recommended 7-year  surveillance.   Medications   Current Outpatient Medications  Medication Sig Dispense Refill   amLODipine  (NORVASC ) 10 MG tablet Take 10 mg by mouth daily.     aspirin  EC 81 MG EC tablet Take 1 tablet (81 mg total) by mouth daily. 30 tablet 0   atenolol  (TENORMIN ) 50 MG tablet Take 50 mg by mouth daily.     Cholecalciferol  50 MCG (2000 UT) CAPS Take 1 tablet by mouth daily.     DULoxetine  (CYMBALTA ) 30 MG capsule Take 30 mg by mouth 2 (two) times daily.     gabapentin  (NEURONTIN ) 100 MG capsule Take 100 mg by mouth 3 (three) times daily.     HYDROcodone -acetaminophen  (NORCO/VICODIN) 5-325 MG tablet Take 1 tablet by mouth 2 (two) times daily.     hydroxychloroquine  (PLAQUENIL ) 200 MG tablet Take 1.5 tablets (300 mg total) by mouth daily. 135 tablet 1   ibuprofen  (ADVIL ) 800 MG tablet Take 800 mg by mouth 2 (two) times daily as needed for mild pain (pain score 1-3).     mirtazapine  (REMERON ) 15 MG tablet Take 15 mg by mouth at bedtime as needed (for sleep).     nitroGLYCERIN  (NITROSTAT ) 0.4 MG SL tablet Place 1 tablet (0.4 mg total) under the tongue every 5 (five) minutes as needed for chest pain. 30 tablet 3   pantoprazole  (PROTONIX ) 40 MG tablet Take 1 tablet (40 mg total) by mouth daily before breakfast. 30 tablet 3   predniSONE  (DELTASONE ) 5 MG tablet Take 5 mg by mouth daily with breakfast.     promethazine  (PHENERGAN ) 25 MG tablet Take 12.5 mg by mouth every 6 (six) hours as needed.     thiamine  (VITAMIN B1) 100 MG tablet Take 100 mg by mouth daily.     tiZANidine (ZANAFLEX) 2 MG tablet Take 2 mg by mouth at bedtime.     hydrocortisone  (ANUSOL -HC) 2.5 % rectal cream Apply 1 Application topically 2 (two) times daily. (Patient not taking: Reported on 11/26/2023)     No current facility-administered medications for this visit.    Allergies   Allergies as of 11/26/2023 - Review Complete 11/24/2023  Allergen Reaction Noted   Metoclopramide  Anaphylaxis and Other (See Comments) 08/08/2016    Codeine Hives 05/19/2019     Past Medical History   Past Medical History:  Diagnosis Date   Abscess    soft tissue   Adrenal mass (HCC)    Alcohol abuse    Anemia    Anxiety    Blood transfusion without reported diagnosis    Chronic abdominal pain    Chronic wound infection of abdomen    Colon polyp    colonoscopy 04/2014   Depression    Discoid lupus erythematosus    Diverticulosis    colonoscopy 04/2014 moderat pan colonic   Drug-seeking behavior    Gastritis    EGD 05/2014   Gastroparesis 01/2014   GERD (gastroesophageal reflux disease)    Hemorrhoid    internal large   Hiatal hernia    History of Billroth II operation    Hypertension    Lung nodule    CT 02/2014 needs repeat 1 month   Lung nodule <  6cm on CT 04/25/2014   Lupus    Malingering    Migraine    Nausea and vomiting    chronic, recurrent   Pancreatitis    Schatzki's ring    patent per EGD 04/2014   Sickle cell trait (HCC)    Stroke [facility])    Suicide attempt (HCC)    Thyroid  disease 2000   overactive, radiation    Past Surgical History   Past Surgical History:  Procedure Laterality Date   ABDOMINAL HYSTERECTOMY  2013   [place]   ABDOMINAL HYSTERECTOMY     ABDOMINAL SURGERY     ADRENALECTOMY Right    AGILE CAPSULE N/A 01/05/2015   Procedure: AGILE CAPSULE;  Surgeon: Lamar CHRISTELLA Hollingshead, MD;  [facility];  Service: Endoscopy;  Laterality: N/A;  0700   BALLOON DILATION N/A 10/01/2021   Procedure: BALLOON DILATION;  Surgeon: Cindie Carlin POUR, DO;  [facility];  Service: Endoscopy;  Laterality: N/A;   Billroth II procedure      [place], first 2000, 2005/2006.   bilroth 2     BIOPSY  05/20/2013   Procedure: BIOPSIES OF ASCENDING AND SIGMOID COLON;  Surgeon: Lamar CHRISTELLA Hollingshead, MD;  Location: [facility];  Service: Endoscopy;;   BIOPSY  04/26/2014   Procedure: BIOPSIES;  Surgeon: Margo LITTIE Haddock, MD;  Location: [facility];  Service: Endoscopy;;   BIOPSY  12/24/2018   Procedure:  BIOPSY;  Surgeon: Hollingshead Lamar CHRISTELLA, MD;  [facility];  Service: Endoscopy;;  colon   CHOLECYSTECTOMY     COLONOSCOPY     in [place]   COLONOSCOPY WITH PROPOFOL  N/A 05/20/2013   Dr.Rourk- inadequate prep, normal appearing rectum, grossly normal colon aside from pancolonic diverticula, normal terminal ileum bx= unremarkable colonic mucosa. Due for early interval 2016.    COLONOSCOPY WITH PROPOFOL  N/A 04/26/2014   DOQ:wnmfjo ileum/one colon polyp removed/moderate pan-colonic diverticulosis/large internal hemorrhoids   COLONOSCOPY WITH PROPOFOL  N/A 12/23/2014   Dr.Rourk- minimal internal hemorrhoids, pancolonic diverticulosis   COLONOSCOPY WITH PROPOFOL  N/A 08/23/2016   Procedure: COLONOSCOPY WITH PROPOFOL ;  Surgeon: Haddock Margo LITTIE, MD;  [facility];  Service: Endoscopy;  Laterality: N/A;   COLONOSCOPY WITH PROPOFOL  N/A 12/24/2018   Pancolonic diverticulosis, normal TI, one 5 mm polyp in rectum (tubular adenoma). Somewhat friable hemorrhagic mucosa in area of IC valve/cecum, possibly related to scope trauma s/p biopsy. Repeat in 7 years.   DEBRIDEMENT OF ABDOMINAL WALL ABSCESS N/A 02/08/2013   Procedure: DEBRIDEMENT OF ABDOMINAL WALL ABSCESS;  Surgeon: Oneil DELENA Budge, MD;  [facility]: [facility];  Service: General;  Laterality: N/A;   ESOPHAGOGASTRODUODENOSCOPY (EGD) WITH PROPOFOL  N/A 05/20/2013   Dr.Rourk- s/p prior gastric surgery with normal esophagus, residual gastric mucosa and patent efferent limb   ESOPHAGOGASTRODUODENOSCOPY (EGD) WITH PROPOFOL  N/A 02/03/2014   Dr. Hollingshead:  s/p hemigastrectomy with retained gastric contents. Residual gastric mucosa and efferent limb appeared normal otherwise. Query gastroparesis.    ESOPHAGOGASTRODUODENOSCOPY (EGD) WITH PROPOFOL  N/A 04/26/2014   DOQ:dryjusxp'd ring/small HH/mild non-erosive gasrtitis/normal anastomosis   ESOPHAGOGASTRODUODENOSCOPY (EGD) WITH PROPOFOL  N/A 12/23/2014   Dr.Rourk- s/p prior hemigastrctomy, active oozing from  anastomotic suture site, hemostasis achieved   ESOPHAGOGASTRODUODENOSCOPY (EGD) WITH PROPOFOL  N/A 05/23/2016   Dr. 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    ESOPHAGOGASTRODUODENOSCOPY (EGD) WITH PROPOFOL  N/A 05/11/2017   Procedure: ESOPHAGOGASTRODUODENOSCOPY (EGD) WITH PROPOFOL ;  Surgeon: Hollingshead Lamar CHRISTELLA, MD;  [facility];  Service: Endoscopy;  Laterality: N/A;   ESOPHAGOGASTRODUODENOSCOPY (EGD) WITH PROPOFOL  N/A 06/07/2021   Procedure: ESOPHAGOGASTRODUODENOSCOPY (EGD) WITH PROPOFOL ;  Surgeon: Shaaron Lamar HERO, MD;  [facility];  Service: Endoscopy;  Laterality: N/A;  11:45am   ESOPHAGOGASTRODUODENOSCOPY (EGD) WITH PROPOFOL  N/A 10/01/2021   Procedure: ESOPHAGOGASTRODUODENOSCOPY (EGD) WITH PROPOFOL ;  Surgeon: Cindie Carlin POUR, DO;  [facility];  Service: Endoscopy;  Laterality: N/A;  3:00pm   EXCISION OF MESH  08/2019   HEMORRHOID SURGERY N/A 06/18/2017   Procedure: THREE COLUMN EXTENSIVE HEMORRHOIDECTOMY;  Surgeon: Kallie Manuelita BROCKS, MD;  Location: [facility];  Service: General;  Laterality: N/A;   HERNIA REPAIR     INCISION AND DRAINAGE ABSCESS N/A 01/06/2017   Procedure: INCISION AND DRAINAGE ABDOMINAL WALL ABSCESS;  Surgeon: Mavis Anes, MD;  Location: [facility];  Service: General;  Laterality: N/A;   incisional hernial repair  09/05/2021   [place]   LEFT HEART CATH AND CORONARY ANGIOGRAPHY N/A 05/09/2020   Procedure: LEFT HEART CATH AND CORONARY ANGIOGRAPHY;  Surgeon: Elmira Newman PARAS, MD;  [facility];  Service: Cardiovascular;  Laterality: N/A;   LEFT HEART CATH AND CORONARY ANGIOGRAPHY N/A 08/22/2023   Procedure: LEFT HEART CATH AND CORONARY ANGIOGRAPHY;  Surgeon: Verlin Lonni BIRCH, MD;  Location: [facility];  Service: Cardiovascular;  Laterality: N/A;   POLYPECTOMY  12/24/2018   Procedure: POLYPECTOMY;  Surgeon: Shaaron Lamar HERO,  MD;  [facility];  Service: Endoscopy;;  colon   tendon repar Right    wrist   WOUND EXPLORATION Right 06/24/2013   Procedure: exploration of traumatic wound right wrist;  Surgeon: Franky JONELLE Curia, MD;  Location: [facility] OR;  Service: Orthopedics;  Laterality: Right;    Past Family History   Family History  Problem Relation Age of Onset   Drug abuse Mother    Lung cancer Father    Cancer Father        mets   Drug abuse Sister    Drug abuse Brother    Breast cancer Maternal Aunt    Bipolar disorder Maternal Aunt    Drug abuse Maternal Aunt    Colon cancer Maternal Grandmother        late 54s, early 28s   Bipolar disorder Paternal Grandfather    Brain cancer Son    Schizophrenia Son    Cancer Son        brain   Bipolar disorder Cousin    Liver disease Neg Hx     Past Social History   Social History   Socioeconomic History   Marital status: Divorced    Spouse name: Not on file   Number of children: 4   Years of education: 10   Highest education level: Not on file  Occupational History   Not on file  Tobacco Use   Smoking status: Every Day    Current packs/day: 0.25    Average packs/day: 0.3 packs/day for 35.0 years (8.8 ttl pk-yrs)    Types: Cigarettes    Passive exposure: Past   Smokeless tobacco: Never  Vaping Use   Vaping status: Never Used  Substance and Sexual Activity   Alcohol use: Not Currently    Comment: quit in 01/2023, previously heavy   Drug use: Not Currently    Types: Cocaine    Comment: no cocaine in4  years per patient (08/20/21)   Sexual activity: Not Currently    Birth control/protection: Surgical  Other Topics Concern   Not  on file  Social History Narrative   Caffeine- tea occass   Social Drivers of Health   Financial Resource Strain: Low Risk  (09/11/2023)   Received from [facility]   Overall Financial Resource Strain (CARDIA)    Difficulty of Paying Living Expenses: Not hard at all  Food Insecurity: No Food Insecurity  (09/11/2023)   Received from [facility]   Hunger Vital Sign    Within the past 12 months, you worried that your food would run out before you got the money to buy more.: Never true    Within the past 12 months, the food you bought just didn't last and you didn't have money to get more.: Never true  Transportation Needs: No Transportation Needs (09/11/2023)   Received from [facility] - Transportation    Lack of Transportation (Medical): No    Lack of Transportation (Non-Medical): No  Physical Activity: Unknown (09/11/2023)   Received from [facility]   Exercise Vital Sign    On average, how many days per week do you engage in moderate to strenuous exercise (like a brisk walk)?: 0 days    Minutes of Exercise per Session: Not on file  Stress: No Stress Concern Present (09/11/2023)   Received from [facility] - Occupational Stress Questionnaire    Feeling of Stress : Only a little  Social Connections: Socially Integrated (09/11/2023)   Received from [facility]   Social Network    How would you rate your social network (family, work, friends)?: Good participation with social networks  Intimate Partner Violence: Not At Risk (09/11/2023)   Received from [facility]   HITS    Over the last 12 months how often did your partner physically hurt you?: Never    Over the last 12 months how often did your partner insult you or talk down to you?: Never    Over the last 12 months how often did your partner threaten you with physical harm?: Never    Over the last 12 months how often did your partner scream or curse at you?: Never    Review of Systems   General: Negative for anorexia, weight loss, fever, chills, fatigue, weakness. ENT: Negative for hoarseness, difficulty swallowing , nasal congestion. CV: Negative for chest pain, angina, palpitations, dyspnea on exertion, peripheral edema.  Respiratory: Negative for dyspnea at rest,  dyspnea on exertion, cough, sputum, wheezing.  GI: See history of present illness. GU:  Negative for dysuria, hematuria, urinary incontinence, urinary frequency, nocturnal urination.  Endo: Negative for unusual weight change.     Physical Exam   BP 123/87 (BP Location: Right Arm, Patient Position: Sitting, Cuff Size: Normal)   Pulse (!) 102   Temp 98.8 F (37.1 C) (Oral)   Ht 5' 3 (1.6 m)   Wt 167 lb (75.8 kg)   LMP  (LMP Unknown)   SpO2 95%   BMI 29.58 kg/m    General: Well-nourished, well-developed in no acute distress.  Eyes: No icterus. Mouth: Oropharyngeal mucosa moist and pink   Lungs: Clear to auscultation bilaterally.  Heart: Regular rate and rhythm, no murmurs rubs or gallops.  Abdomen: Bowel sounds are normal, nontender, nondistended, no hepatosplenomegaly or masses,  no abdominal bruits or hernia , no rebound or guarding.  Rectal: not performed Extremities: No lower extremity edema. No clubbing or deformities. Neuro: Alert and oriented x 4   Skin: Warm and dry, no jaundice.   Psych: Alert  and cooperative, normal mood and affect.  Labs   See above  Imaging Studies   NCV with EMG (electromyography) Result Date: 11/18/2023 Eldonna Novel, MD     11/24/2023  6:18 AM EMG & NCV Findings: Evaluation of the left median motor nerve showed prolonged distal onset latency (4.3 ms), reduced amplitude (4.1 mV), and decreased conduction velocity (Elbow-Wrist, 48 m/s).  The left median (across palm) sensory nerve showed prolonged distal peak latency (Wrist, 4.3 ms) and prolonged distal peak latency (Palm, 2.1 ms).  The left ulnar sensory nerve showed prolonged distal peak latency (3.8 ms) and decreased conduction velocity (Wrist-5th Digit, 37 m/s).  All remaining nerves (as indicated in the following tables) were within normal limits.  All examined muscles (as indicated in the following table) showed no evidence of electrical instability.  Impression: The above electrodiagnostic  study is ABNORMAL and reveals evidence of a moderate to severe left median nerve entrapment at the wrist (carpal tunnel syndrome) affecting sensory and motor components. There is no significant electrodiagnostic evidence of any other focal nerve entrapment, brachial plexopathy or cervical radiculopathy. Recommendations: 1.  Follow-up with referring physician. 2.  Continue current management of symptoms. 3.  Continue use of resting splint at night-time and as needed during the day. 4.  Suggest surgical evaluation. ___________________________ Prentice Eldonna FAAPMR Board Certified, American Board of Physical Medicine and Rehabilitation Nerve Conduction Studies Anti Sensory Summary Table  Stim Site NR Peak (ms) Norm Peak (ms) P-T Amp (V) Norm P-T Amp Site1 Site2 Delta-P (ms) Dist (cm) Vel (m/s) Norm Vel (m/s) Left Median Acr Palm Anti Sensory (2nd Digit)  29.3C Wrist    *4.3 <3.6 19.6 >10 Wrist Palm 2.2 0.0   Palm    *2.1 <2.0 1.7        Left Radial Anti Sensory (Base 1st Digit)  29.4C Wrist    2.3 <3.1 41.8  Wrist Base 1st Digit 2.3 0.0   Left Ulnar Anti Sensory (5th Digit)  29.7C Wrist    *3.8 <3.7 20.2 >15.0 Wrist 5th Digit 3.8 14.0 *37 >38 Motor Summary Table  Stim Site NR Onset (ms) Norm Onset (ms) O-P Amp (mV) Norm O-P Amp Site1 Site2 Delta-0 (ms) Dist (cm) Vel (m/s) Norm Vel (m/s) Left Median Motor (Abd Poll Brev)  29.3C Wrist    *4.3 <4.2 *4.1 >5 Elbow Wrist 4.5 21.7 *48 >50 Elbow    8.8  3.3        Left Ulnar Motor (Abd Dig Min)  29.3C Wrist    3.1 <4.2 9.3 >3 B Elbow Wrist 3.2 18.5 58 >53 B Elbow    6.3  9.1  A Elbow B Elbow 1.5 10.0 67 >53 A Elbow    7.8  8.8        EMG  Side Muscle Nerve Root Ins Act Fibs Psw Amp Dur Poly Recrt Int Bruna Comment Left Abd Poll Brev Median C8-T1 Nml Nml Nml Nml Nml 0 Nml Nml  Left 1stDorInt Ulnar C8-T1 Nml Nml Nml Nml Nml 0 Nml Nml  Left PronatorTeres Median C6-7 Nml Nml Nml Nml Nml 0 Nml Nml  Left Biceps Musculocut C5-6 Nml Nml Nml Nml Nml 0 Nml Nml  Left Deltoid Axillary  C5-6 Nml Nml Nml Nml Nml 0 Nml Nml  Nerve Conduction Studies Anti Sensory Left/Right Comparison  Stim Site L Lat (ms) R Lat (ms) L-R Lat (ms) L Amp (V) R Amp (V) L-R Amp (%) Site1 Site2 L Vel (m/s) R Vel (m/s) L-R Vel (m/s) Median Acr  Palm Anti Sensory (2nd Digit)  29.3C Wrist *4.3   19.6   Wrist Palm    Palm *2.1   1.7        Radial Anti Sensory (Base 1st Digit)  29.4C Wrist 2.3   41.8   Wrist Base 1st Digit    Ulnar Anti Sensory (5th Digit)  29.7C Wrist *3.8   20.2   Wrist 5th Digit *37   Motor Left/Right Comparison  Stim Site L Lat (ms) R Lat (ms) L-R Lat (ms) L Amp (mV) R Amp (mV) L-R Amp (%) Site1 Site2 L Vel (m/s) R Vel (m/s) L-R Vel (m/s) Median Motor (Abd Poll Brev)  29.3C Wrist *4.3   *4.1   Elbow Wrist *48   Elbow 8.8   3.3        Ulnar Motor (Abd Dig Min)  29.3C Wrist 3.1   9.3   B Elbow Wrist 58   B Elbow 6.3   9.1   A Elbow B Elbow 67   A Elbow 7.8   8.8        Waveforms:        XR Shoulder Left Result Date: 11/03/2023 X-ray left shoulder 4 views Humerus position appears normal in internal/external rotation.  Glenohumeral joint space normal.  Acromiohumeral distance is normal.  Acromioclavicular joint space appears normal.  No visible effusion or soft tissue calcifications. Impression No significant appearing osteoarthritis   Assessment/Plan:    Chronic rectal pressure Symptoms for greater than six months, unrelieved by bowel movements. Given iron deficiency, last colonoscopy in 2020, planning on colonoscopy for further evaluation.  -colonoscopy with Dr. Shaaron. ASA 3, room 1,2 ok.  - I have discussed the risks, alternatives, benefits with regards to but not limited to the risk of reaction to medication, bleeding, infection, perforation and the patient is agreeable to proceed. Written consent to be obtained. -will schedule after upcoming labs ruling out pernicious anemia   Iron deficiency without anemia Low iron levels without anemia. Prior Billroth II procedure for complicated PUD.   -plan for colonoscopy, may require EGD pending labs.  B12 deficiency: Reports prior B12 injections years ago. She had low normal values 8 months ago, instructed to take oral replacement therapy and she didn't realize her B1 supplement was not adequate.  -rule out pernicious anemia -recheck B12 as last lab was 3 months ago, will start injections as appropriate after labs return. Start oral B12 2000mcg daily now      Sonny RAMAN. Ezzard, MHS, PA-C [facility]

## 2023-11-26 NOTE — H&P (View-Only) (Signed)
 GI Office Note    Referring Provider: Carlette Benita Area* Primary Care Physician:  Fanta, Tesfaye Demissie, MD  Primary Gastroenterologist: Ozell Hollingshead, MD   Chief Complaint   Chief Complaint  Patient presents with   Follow-up    Still having issues with rectal pressure    History of Present Illness   Angel Price is a 58 y.o. female presenting today for follow up.  Patient last seen May 2025.  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   At last visit she was experiencing generalized abdominal pain and bloating in the setting of constipation, noted some mild anemia with low normal B12, esophageal dysphagia planned for barium pill esophagram, provided Anusol  for suspected hemorrhoid flare.  Patient did not complete labs as recommended.  Since we last saw her she was hospitalized in June of this year with chest pain.  She had atypical chest pain, outpatient stress test showed abnormality suggestive of significant coronary artery disease.  Decision was made to pursue inpatient left heart cath showed nonobstructive coronary artery disease.  Discussed the use of AI scribe software for clinical note transcription with the patient, who gave verbal consent to proceed.   She has experienced significant rectal pressure for nearly a year, described as a persistent sensation in the rectum as if she needs to have a stool. States she has a daily BM but never gets relief of the rectal pressure. She sometimes sits on hard surfaces to alleviate the 'pulsating' pressure. No blood in stool is noted.  She has a history of low B12 and iron levels, with B12 previously on the low end of  normal. Recently B12 and ferritin checked in June. Her B12 was very low and ferritin low. She is not on B12 or iron supplements. She states she used to get B12 shots years ago. She chronically takes B1 supplements. In November, it will be one year since she quit all etoh.   She denies abdominal pain, n/v. No heartburn or dysphagia. No melena, brbpr. She states she has back issues and does she a provider for this.   She quit alcohol nearly a year ago and stopped using cocaine five years ago. She currently smokes cigarettes.       Prior Data   Labs from November 14, 2023: Albumin 3.8, total bilirubin 0.4, alk phos 81, AST 19, ALT 21  Labs from September 09, 2023: Hemoglobin 12.5, hematocrit 34.8, MCV 74.7, platelets 364, white blood cell count 10.6  Labs from August 22, 2023:creatinine 0.4, Ferritin 9 L, iron 53, iron sat 12%, TIBC 439, folate 19.6, B12 126 low,   BPE May 2025: Multiple surgical clips noted in the area of the suppose of gastric remnant, normal esophagus, no hiatal hernia, no GERD visualized, 13 mm barium tablet passed normally.  CT A/P with contrast 03/2023 IMPRESSION: 1. No acute abnormality in the abdomen or pelvis. 2. Surgical changes of prior gastric bypass, cholecystectomy, right adrenalectomy and hysterectomy. 3. Stable 18 mm left adrenal nodule previously characterized as a benign adenoma on noncontrast enhanced examination dated February 21, 2023 this requires no independent imaging follow-up. 4.  Aortic Atherosclerosis (ICD10-I70.0).  EGD 10/01/2021: Mild Schatzki's ring dilated, small amount of food residue in the stomach, otherwise  normal exam.   Last colonoscopy 12/24/2018: Diverticulosis in the entire colon, normal TI, 5 mm polyp resected and retrieved, somewhat friable hemorrhagic mucosa in the area of the ileocecal valve/cecum which may be artifact related to scope trauma s/p biopsy.  Polyp was a tubular adenoma.  Colon biopsy was benign.  Recommended 7-year  surveillance.   Medications   Current Outpatient Medications  Medication Sig Dispense Refill   amLODipine  (NORVASC ) 10 MG tablet Take 10 mg by mouth daily.     aspirin  EC 81 MG EC tablet Take 1 tablet (81 mg total) by mouth daily. 30 tablet 0   atenolol  (TENORMIN ) 50 MG tablet Take 50 mg by mouth daily.     Cholecalciferol  50 MCG (2000 UT) CAPS Take 1 tablet by mouth daily.     DULoxetine  (CYMBALTA ) 30 MG capsule Take 30 mg by mouth 2 (two) times daily.     gabapentin  (NEURONTIN ) 100 MG capsule Take 100 mg by mouth 3 (three) times daily.     HYDROcodone -acetaminophen  (NORCO/VICODIN) 5-325 MG tablet Take 1 tablet by mouth 2 (two) times daily.     hydroxychloroquine  (PLAQUENIL ) 200 MG tablet Take 1.5 tablets (300 mg total) by mouth daily. 135 tablet 1   ibuprofen  (ADVIL ) 800 MG tablet Take 800 mg by mouth 2 (two) times daily as needed for mild pain (pain score 1-3).     mirtazapine  (REMERON ) 15 MG tablet Take 15 mg by mouth at bedtime as needed (for sleep).     nitroGLYCERIN  (NITROSTAT ) 0.4 MG SL tablet Place 1 tablet (0.4 mg total) under the tongue every 5 (five) minutes as needed for chest pain. 30 tablet 3   pantoprazole  (PROTONIX ) 40 MG tablet Take 1 tablet (40 mg total) by mouth daily before breakfast. 30 tablet 3   predniSONE  (DELTASONE ) 5 MG tablet Take 5 mg by mouth daily with breakfast.     promethazine  (PHENERGAN ) 25 MG tablet Take 12.5 mg by mouth every 6 (six) hours as needed.     thiamine  (VITAMIN B1) 100 MG tablet Take 100 mg by mouth daily.     tiZANidine (ZANAFLEX) 2 MG tablet Take 2 mg by mouth at bedtime.     hydrocortisone  (ANUSOL -HC) 2.5 % rectal cream Apply 1 Application topically 2 (two) times daily. (Patient not taking: Reported on 11/26/2023)     No current facility-administered medications for this visit.    Allergies   Allergies as of 11/26/2023 - Review Complete 11/24/2023  Allergen Reaction Noted   Metoclopramide  Anaphylaxis and Other (See Comments) 08/08/2016    Codeine Hives 05/19/2019     Past Medical History   Past Medical History:  Diagnosis Date   Abscess    soft tissue   Adrenal mass (HCC)    Alcohol abuse    Anemia    Anxiety    Blood transfusion without reported diagnosis    Chronic abdominal pain    Chronic wound infection of abdomen    Colon polyp    colonoscopy 04/2014   Depression    Discoid lupus erythematosus    Diverticulosis    colonoscopy 04/2014 moderat pan colonic   Drug-seeking behavior    Gastritis    EGD 05/2014   Gastroparesis 01/2014   GERD (gastroesophageal reflux disease)    Hemorrhoid    internal large   Hiatal hernia    History of Billroth II operation    Hypertension    Lung nodule    CT 02/2014 needs repeat 1 month   Lung nodule <  6cm on CT 04/25/2014   Lupus    Malingering    Migraine    Nausea and vomiting    chronic, recurrent   Pancreatitis    Schatzki's ring    patent per EGD 04/2014   Sickle cell trait (HCC)    Stroke [facility])    Suicide attempt (HCC)    Thyroid  disease 2000   overactive, radiation    Past Surgical History   Past Surgical History:  Procedure Laterality Date   ABDOMINAL HYSTERECTOMY  2013   [place]   ABDOMINAL HYSTERECTOMY     ABDOMINAL SURGERY     ADRENALECTOMY Right    AGILE CAPSULE N/A 01/05/2015   Procedure: AGILE CAPSULE;  Surgeon: Lamar CHRISTELLA Hollingshead, MD;  [facility];  Service: Endoscopy;  Laterality: N/A;  0700   BALLOON DILATION N/A 10/01/2021   Procedure: BALLOON DILATION;  Surgeon: Cindie Carlin POUR, DO;  [facility];  Service: Endoscopy;  Laterality: N/A;   Billroth II procedure      [place], first 2000, 2005/2006.   bilroth 2     BIOPSY  05/20/2013   Procedure: BIOPSIES OF ASCENDING AND SIGMOID COLON;  Surgeon: Lamar CHRISTELLA Hollingshead, MD;  Location: [facility];  Service: Endoscopy;;   BIOPSY  04/26/2014   Procedure: BIOPSIES;  Surgeon: Margo LITTIE Haddock, MD;  Location: [facility];  Service: Endoscopy;;   BIOPSY  12/24/2018   Procedure:  BIOPSY;  Surgeon: Hollingshead Lamar CHRISTELLA, MD;  [facility];  Service: Endoscopy;;  colon   CHOLECYSTECTOMY     COLONOSCOPY     in [place]   COLONOSCOPY WITH PROPOFOL  N/A 05/20/2013   Dr.Rourk- inadequate prep, normal appearing rectum, grossly normal colon aside from pancolonic diverticula, normal terminal ileum bx= unremarkable colonic mucosa. Due for early interval 2016.    COLONOSCOPY WITH PROPOFOL  N/A 04/26/2014   DOQ:wnmfjo ileum/one colon polyp removed/moderate pan-colonic diverticulosis/large internal hemorrhoids   COLONOSCOPY WITH PROPOFOL  N/A 12/23/2014   Dr.Rourk- minimal internal hemorrhoids, pancolonic diverticulosis   COLONOSCOPY WITH PROPOFOL  N/A 08/23/2016   Procedure: COLONOSCOPY WITH PROPOFOL ;  Surgeon: Haddock Margo LITTIE, MD;  [facility];  Service: Endoscopy;  Laterality: N/A;   COLONOSCOPY WITH PROPOFOL  N/A 12/24/2018   Pancolonic diverticulosis, normal TI, one 5 mm polyp in rectum (tubular adenoma). Somewhat friable hemorrhagic mucosa in area of IC valve/cecum, possibly related to scope trauma s/p biopsy. Repeat in 7 years.   DEBRIDEMENT OF ABDOMINAL WALL ABSCESS N/A 02/08/2013   Procedure: DEBRIDEMENT OF ABDOMINAL WALL ABSCESS;  Surgeon: Oneil DELENA Budge, MD;  Location: [facility];  Service: General;  Laterality: N/A;   ESOPHAGOGASTRODUODENOSCOPY (EGD) WITH PROPOFOL  N/A 05/20/2013   Dr.Rourk- s/p prior gastric surgery with normal esophagus, residual gastric mucosa and patent efferent limb   ESOPHAGOGASTRODUODENOSCOPY (EGD) WITH PROPOFOL  N/A 02/03/2014   Dr. Hollingshead:  s/p hemigastrectomy with retained gastric contents. Residual gastric mucosa and efferent limb appeared normal otherwise. Query gastroparesis.    ESOPHAGOGASTRODUODENOSCOPY (EGD) WITH PROPOFOL  N/A 04/26/2014   DOQ:dryjusxp'd ring/small HH/mild non-erosive gasrtitis/normal anastomosis   ESOPHAGOGASTRODUODENOSCOPY (EGD) WITH PROPOFOL  N/A 12/23/2014   Dr.Rourk- s/p prior hemigastrctomy, active oozing from  anastomotic suture site, hemostasis achieved   ESOPHAGOGASTRODUODENOSCOPY (EGD) WITH PROPOFOL  N/A 05/23/2016   Dr. 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    ESOPHAGOGASTRODUODENOSCOPY (EGD) WITH PROPOFOL  N/A 05/11/2017   Procedure: ESOPHAGOGASTRODUODENOSCOPY (EGD) WITH PROPOFOL ;  Surgeon: Hollingshead Lamar CHRISTELLA, MD;  [facility];  Service: Endoscopy;  Laterality: N/A;   ESOPHAGOGASTRODUODENOSCOPY (EGD) WITH PROPOFOL  N/A 06/07/2021   Procedure: ESOPHAGOGASTRODUODENOSCOPY (EGD) WITH PROPOFOL ;  Surgeon: Shaaron Lamar HERO, MD;  [facility];  Service: Endoscopy;  Laterality: N/A;  11:45am   ESOPHAGOGASTRODUODENOSCOPY (EGD) WITH PROPOFOL  N/A 10/01/2021   Procedure: ESOPHAGOGASTRODUODENOSCOPY (EGD) WITH PROPOFOL ;  Surgeon: Cindie Carlin POUR, DO;  [facility];  Service: Endoscopy;  Laterality: N/A;  3:00pm   EXCISION OF MESH  08/2019   HEMORRHOID SURGERY N/A 06/18/2017   Procedure: THREE COLUMN EXTENSIVE HEMORRHOIDECTOMY;  Surgeon: Kallie Manuelita BROCKS, MD;  Location: [facility];  Service: General;  Laterality: N/A;   HERNIA REPAIR     INCISION AND DRAINAGE ABSCESS N/A 01/06/2017   Procedure: INCISION AND DRAINAGE ABDOMINAL WALL ABSCESS;  Surgeon: Mavis Anes, MD;  Location: [facility];  Service: General;  Laterality: N/A;   incisional hernial repair  09/05/2021   [place]   LEFT HEART CATH AND CORONARY ANGIOGRAPHY N/A 05/09/2020   Procedure: LEFT HEART CATH AND CORONARY ANGIOGRAPHY;  Surgeon: Elmira Newman PARAS, MD;  [facility];  Service: Cardiovascular;  Laterality: N/A;   LEFT HEART CATH AND CORONARY ANGIOGRAPHY N/A 08/22/2023   Procedure: LEFT HEART CATH AND CORONARY ANGIOGRAPHY;  Surgeon: Verlin Lonni BIRCH, MD;  Location: [facility];  Service: Cardiovascular;  Laterality: N/A;   POLYPECTOMY  12/24/2018   Procedure: POLYPECTOMY;  Surgeon: Shaaron Lamar HERO,  MD;  [facility];  Service: Endoscopy;;  colon   tendon repar Right    wrist   WOUND EXPLORATION Right 06/24/2013   Procedure: exploration of traumatic wound right wrist;  Surgeon: Franky JONELLE Curia, MD;  Location: [facility] OR;  Service: Orthopedics;  Laterality: Right;    Past Family History   Family History  Problem Relation Age of Onset   Drug abuse Mother    Lung cancer Father    Cancer Father        mets   Drug abuse Sister    Drug abuse Brother    Breast cancer Maternal Aunt    Bipolar disorder Maternal Aunt    Drug abuse Maternal Aunt    Colon cancer Maternal Grandmother        late 54s, early 28s   Bipolar disorder Paternal Grandfather    Brain cancer Son    Schizophrenia Son    Cancer Son        brain   Bipolar disorder Cousin    Liver disease Neg Hx     Past Social History   Social History   Socioeconomic History   Marital status: Divorced    Spouse name: Not on file   Number of children: 4   Years of education: 10   Highest education level: Not on file  Occupational History   Not on file  Tobacco Use   Smoking status: Every Day    Current packs/day: 0.25    Average packs/day: 0.3 packs/day for 35.0 years (8.8 ttl pk-yrs)    Types: Cigarettes    Passive exposure: Past   Smokeless tobacco: Never  Vaping Use   Vaping status: Never Used  Substance and Sexual Activity   Alcohol use: Not Currently    Comment: quit in 01/2023, previously heavy   Drug use: Not Currently    Types: Cocaine    Comment: no cocaine in4  years per patient (08/20/21)   Sexual activity: Not Currently    Birth control/protection: Surgical  Other Topics Concern   Not  on file  Social History Narrative   Caffeine- tea occass   Social Drivers of Health   Financial Resource Strain: Low Risk  (09/11/2023)   Received from [facility]   Overall Financial Resource Strain (CARDIA)    Difficulty of Paying Living Expenses: Not hard at all  Food Insecurity: No Food Insecurity  (09/11/2023)   Received from [facility]   Hunger Vital Sign    Within the past 12 months, you worried that your food would run out before you got the money to buy more.: Never true    Within the past 12 months, the food you bought just didn't last and you didn't have money to get more.: Never true  Transportation Needs: No Transportation Needs (09/11/2023)   Received from [facility] - Transportation    Lack of Transportation (Medical): No    Lack of Transportation (Non-Medical): No  Physical Activity: Unknown (09/11/2023)   Received from [facility]   Exercise Vital Sign    On average, how many days per week do you engage in moderate to strenuous exercise (like a brisk walk)?: 0 days    Minutes of Exercise per Session: Not on file  Stress: No Stress Concern Present (09/11/2023)   Received from [facility] - Occupational Stress Questionnaire    Feeling of Stress : Only a little  Social Connections: Socially Integrated (09/11/2023)   Received from [facility]   Social Network    How would you rate your social network (family, work, friends)?: Good participation with social networks  Intimate Partner Violence: Not At Risk (09/11/2023)   Received from [facility]   HITS    Over the last 12 months how often did your partner physically hurt you?: Never    Over the last 12 months how often did your partner insult you or talk down to you?: Never    Over the last 12 months how often did your partner threaten you with physical harm?: Never    Over the last 12 months how often did your partner scream or curse at you?: Never    Review of Systems   General: Negative for anorexia, weight loss, fever, chills, fatigue, weakness. ENT: Negative for hoarseness, difficulty swallowing , nasal congestion. CV: Negative for chest pain, angina, palpitations, dyspnea on exertion, peripheral edema.  Respiratory: Negative for dyspnea at rest,  dyspnea on exertion, cough, sputum, wheezing.  GI: See history of present illness. GU:  Negative for dysuria, hematuria, urinary incontinence, urinary frequency, nocturnal urination.  Endo: Negative for unusual weight change.     Physical Exam   BP 123/87 (BP Location: Right Arm, Patient Position: Sitting, Cuff Size: Normal)   Pulse (!) 102   Temp 98.8 F (37.1 C) (Oral)   Ht 5' 3 (1.6 m)   Wt 167 lb (75.8 kg)   LMP  (LMP Unknown)   SpO2 95%   BMI 29.58 kg/m    General: Well-nourished, well-developed in no acute distress.  Eyes: No icterus. Mouth: Oropharyngeal mucosa moist and pink   Lungs: Clear to auscultation bilaterally.  Heart: Regular rate and rhythm, no murmurs rubs or gallops.  Abdomen: Bowel sounds are normal, nontender, nondistended, no hepatosplenomegaly or masses,  no abdominal bruits or hernia , no rebound or guarding.  Rectal: not performed Extremities: No lower extremity edema. No clubbing or deformities. Neuro: Alert and oriented x 4   Skin: Warm and dry, no jaundice.   Psych: Alert  and cooperative, normal mood and affect.  Labs   See above  Imaging Studies   NCV with EMG (electromyography) Result Date: 11/18/2023 Eldonna Novel, MD     11/24/2023  6:18 AM EMG & NCV Findings: Evaluation of the left median motor nerve showed prolonged distal onset latency (4.3 ms), reduced amplitude (4.1 mV), and decreased conduction velocity (Elbow-Wrist, 48 m/s).  The left median (across palm) sensory nerve showed prolonged distal peak latency (Wrist, 4.3 ms) and prolonged distal peak latency (Palm, 2.1 ms).  The left ulnar sensory nerve showed prolonged distal peak latency (3.8 ms) and decreased conduction velocity (Wrist-5th Digit, 37 m/s).  All remaining nerves (as indicated in the following tables) were within normal limits.  All examined muscles (as indicated in the following table) showed no evidence of electrical instability.  Impression: The above electrodiagnostic  study is ABNORMAL and reveals evidence of a moderate to severe left median nerve entrapment at the wrist (carpal tunnel syndrome) affecting sensory and motor components. There is no significant electrodiagnostic evidence of any other focal nerve entrapment, brachial plexopathy or cervical radiculopathy. Recommendations: 1.  Follow-up with referring physician. 2.  Continue current management of symptoms. 3.  Continue use of resting splint at night-time and as needed during the day. 4.  Suggest surgical evaluation. ___________________________ Prentice Eldonna FAAPMR Board Certified, American Board of Physical Medicine and Rehabilitation Nerve Conduction Studies Anti Sensory Summary Table  Stim Site NR Peak (ms) Norm Peak (ms) P-T Amp (V) Norm P-T Amp Site1 Site2 Delta-P (ms) Dist (cm) Vel (m/s) Norm Vel (m/s) Left Median Acr Palm Anti Sensory (2nd Digit)  29.3C Wrist    *4.3 <3.6 19.6 >10 Wrist Palm 2.2 0.0   Palm    *2.1 <2.0 1.7        Left Radial Anti Sensory (Base 1st Digit)  29.4C Wrist    2.3 <3.1 41.8  Wrist Base 1st Digit 2.3 0.0   Left Ulnar Anti Sensory (5th Digit)  29.7C Wrist    *3.8 <3.7 20.2 >15.0 Wrist 5th Digit 3.8 14.0 *37 >38 Motor Summary Table  Stim Site NR Onset (ms) Norm Onset (ms) O-P Amp (mV) Norm O-P Amp Site1 Site2 Delta-0 (ms) Dist (cm) Vel (m/s) Norm Vel (m/s) Left Median Motor (Abd Poll Brev)  29.3C Wrist    *4.3 <4.2 *4.1 >5 Elbow Wrist 4.5 21.7 *48 >50 Elbow    8.8  3.3        Left Ulnar Motor (Abd Dig Min)  29.3C Wrist    3.1 <4.2 9.3 >3 B Elbow Wrist 3.2 18.5 58 >53 B Elbow    6.3  9.1  A Elbow B Elbow 1.5 10.0 67 >53 A Elbow    7.8  8.8        EMG  Side Muscle Nerve Root Ins Act Fibs Psw Amp Dur Poly Recrt Int Bruna Comment Left Abd Poll Brev Median C8-T1 Nml Nml Nml Nml Nml 0 Nml Nml  Left 1stDorInt Ulnar C8-T1 Nml Nml Nml Nml Nml 0 Nml Nml  Left PronatorTeres Median C6-7 Nml Nml Nml Nml Nml 0 Nml Nml  Left Biceps Musculocut C5-6 Nml Nml Nml Nml Nml 0 Nml Nml  Left Deltoid Axillary  C5-6 Nml Nml Nml Nml Nml 0 Nml Nml  Nerve Conduction Studies Anti Sensory Left/Right Comparison  Stim Site L Lat (ms) R Lat (ms) L-R Lat (ms) L Amp (V) R Amp (V) L-R Amp (%) Site1 Site2 L Vel (m/s) R Vel (m/s) L-R Vel (m/s) Median Acr  Palm Anti Sensory (2nd Digit)  29.3C Wrist *4.3   19.6   Wrist Palm    Palm *2.1   1.7        Radial Anti Sensory (Base 1st Digit)  29.4C Wrist 2.3   41.8   Wrist Base 1st Digit    Ulnar Anti Sensory (5th Digit)  29.7C Wrist *3.8   20.2   Wrist 5th Digit *37   Motor Left/Right Comparison  Stim Site L Lat (ms) R Lat (ms) L-R Lat (ms) L Amp (mV) R Amp (mV) L-R Amp (%) Site1 Site2 L Vel (m/s) R Vel (m/s) L-R Vel (m/s) Median Motor (Abd Poll Brev)  29.3C Wrist *4.3   *4.1   Elbow Wrist *48   Elbow 8.8   3.3        Ulnar Motor (Abd Dig Min)  29.3C Wrist 3.1   9.3   B Elbow Wrist 58   B Elbow 6.3   9.1   A Elbow B Elbow 67   A Elbow 7.8   8.8        Waveforms:        XR Shoulder Left Result Date: 11/03/2023 X-ray left shoulder 4 views Humerus position appears normal in internal/external rotation.  Glenohumeral joint space normal.  Acromiohumeral distance is normal.  Acromioclavicular joint space appears normal.  No visible effusion or soft tissue calcifications. Impression No significant appearing osteoarthritis   Assessment/Plan:    Chronic rectal pressure Symptoms for greater than six months, unrelieved by bowel movements. Given iron deficiency, last colonoscopy in 2020, planning on colonoscopy for further evaluation.  -colonoscopy with Dr. Shaaron. ASA 3, room 1,2 ok.  - I have discussed the risks, alternatives, benefits with regards to but not limited to the risk of reaction to medication, bleeding, infection, perforation and the patient is agreeable to proceed. Written consent to be obtained. -will schedule after upcoming labs ruling out pernicious anemia   Iron deficiency without anemia Low iron levels without anemia. Prior Billroth II procedure for complicated PUD.   -plan for colonoscopy, may require EGD pending labs.  B12 deficiency: Reports prior B12 injections years ago. She had low normal values 8 months ago, instructed to take oral replacement therapy and she didn't realize her B1 supplement was not adequate.  -rule out pernicious anemia -recheck B12 as last lab was 3 months ago, will start injections as appropriate after labs return. Start oral B12 2000mcg daily now      Sonny RAMAN. Ezzard, MHS, PA-C [facility]

## 2023-11-26 NOTE — Patient Instructions (Signed)
 Please start B12 (cyanocobalamin ) 1000mcg daily, try to find the ones that dissolve under the tongue.   Complete labs as soon as you can at [facility]. This will help up determine if you need an upper endoscopy and/or B12 shots. We will schedule your colonoscopy once we have this information.   Continue pantoprazole  40mg  daily before breakfast.

## 2023-11-27 LAB — CBC WITH DIFFERENTIAL/PLATELET
Basophils Absolute: 0 x10E3/uL (ref 0.0–0.2)
Basos: 0 %
EOS (ABSOLUTE): 0.1 x10E3/uL (ref 0.0–0.4)
Eos: 2 %
Hematocrit: 42.8 % (ref 34.0–46.6)
Hemoglobin: 13.5 g/dL (ref 11.1–15.9)
Immature Grans (Abs): 0 x10E3/uL (ref 0.0–0.1)
Immature Granulocytes: 0 %
Lymphocytes Absolute: 4.1 x10E3/uL — ABNORMAL HIGH (ref 0.7–3.1)
Lymphs: 57 %
MCH: 25 pg — ABNORMAL LOW (ref 26.6–33.0)
MCHC: 31.5 g/dL (ref 31.5–35.7)
MCV: 79 fL (ref 79–97)
Monocytes Absolute: 0.6 x10E3/uL (ref 0.1–0.9)
Monocytes: 8 %
Neutrophils Absolute: 2.4 x10E3/uL (ref 1.4–7.0)
Neutrophils: 33 %
Platelets: 321 x10E3/uL (ref 150–450)
RBC: 5.41 x10E6/uL — ABNORMAL HIGH (ref 3.77–5.28)
RDW: 17.1 % — ABNORMAL HIGH (ref 11.7–15.4)
WBC: 7.3 x10E3/uL (ref 3.4–10.8)

## 2023-11-27 LAB — ANTI-PARIETAL ANTIBODY: Parietal Cell Ab: 3.6 U (ref 0.0–20.0)

## 2023-11-27 LAB — IRON,TIBC AND FERRITIN PANEL
Ferritin: 18 ng/mL (ref 15–150)
Iron Saturation: 12 % — ABNORMAL LOW (ref 15–55)
Iron: 49 ug/dL (ref 27–159)
Total Iron Binding Capacity: 421 ug/dL (ref 250–450)
UIBC: 372 ug/dL (ref 131–425)

## 2023-11-27 LAB — VITAMIN B12: Vitamin B-12: 181 pg/mL — ABNORMAL LOW (ref 232–1245)

## 2023-11-27 LAB — INTRINSIC FACTOR ANTIBODIES: Intrinsic Factor Abs, Serum: 1.1 [AU]/ml (ref 0.0–1.1)

## 2023-12-05 ENCOUNTER — Ambulatory Visit: Payer: Self-pay | Admitting: Gastroenterology

## 2023-12-08 ENCOUNTER — Ambulatory Visit (INDEPENDENT_AMBULATORY_CARE_PROVIDER_SITE_OTHER): Admitting: Orthopedic Surgery

## 2023-12-08 DIAGNOSIS — G5603 Carpal tunnel syndrome, bilateral upper limbs: Secondary | ICD-10-CM

## 2023-12-08 NOTE — Progress Notes (Signed)
 Angel Price - 58 y.o. female MRN [id number]  Date of birth: [birth year]  Office Visit Note: Visit Date: 12/08/2023 PCP: Carlette Benita Area, MD Referred by: Jeannetta Lonni ORN, MD  Subjective: No chief complaint on file.  HPI: Angel Price is a pleasant 58 y.o. female who presents today for evaluation of ongoing bilateral carpal tunnel syndrome that is been present now for greater than 1 year.  Symptoms are worsening in nature.  She has ongoing nocturnal symptoms which are no longer responding to bracing.  She has also undergone prior injections without lasting relief.  She is here today for specific hand surgical evaluation.  Pertinent ROS were reviewed with the patient and found to be negative unless otherwise specified above in HPI.   Visit Reason: bilateral carpal tunnel syndrome L>R Duration of symptoms: 1 + year Hand dominance: left Occupation: none Diabetic: No Smoking: Yes- half pack a day Heart/Lung History: HTN CVA (cerebral vascular accident) (HCC) Essential hypertension Coronary artery disease of native artery of native heart with stable angina pectoris (HCC) Paroxysmal atrial fibrillation (HCC) Blood Thinners: aspirin    Prior Testing/EMG: bilateral EMG Injections (Date): prior, unsure of date Treatments: bracing- no relief Prior Surgery: none  Assessment & Plan: Visit Diagnoses:  1. Bilateral carpal tunnel syndrome     Plan: Extensive discussion was had with the patient today about her ongoing bilateral carpal tunnel syndrome that is refractory to conservative care.  Patient has both clinical and electrodiagnostic evidence to confirm this diagnosis.  At this juncture, she is indicated for a lateral, stage open versus endoscopic carpal tunnel release.  Risks and benefits of both operations were discussed in detail today.  Understanding all risks and benefits, patient would like to have surgery done in the form of bilateral, staged endoscopic carpal  tunnel release.  She would like to begin with the left side.  Risks include but not limited to infection, bleeding, scarring, stiffness, nerve injury or vascular, tendon injury, risk of recurrence and need for subsequent operation were all discussed in detail.  Patient consented understanding the above.  Will move forward surgical scheduling of left endoscopic carpal tunnel release at the next available date pending medical clearance.   Follow-up: No follow-ups on file.   Meds & Orders: No orders of the defined types were placed in this encounter.  No orders of the defined types were placed in this encounter.    Procedures: No procedures performed      Clinical History: No specialty comments available.  She reports that she has been smoking cigarettes. She has a 8.8 pack-year smoking history. She has been exposed to tobacco smoke. She has never used smokeless tobacco.  Recent Labs    03/15/23 0425  HGBA1C 5.1    Objective:   Vital Signs: LMP  (LMP Unknown)   Physical Exam  Gen: Well-appearing, in no acute distress; non-toxic CV: Regular Rate. Well-perfused. Warm.  Resp: Breathing unlabored on room air; no wheezing. Psych: Fluid speech in conversation; appropriate affect; normal thought process  Ortho Exam PHYSICAL EXAM:  General: Patient is well appearing and in no distress.   Skin and Muscle: No significant skin changes are apparent to upper extremities.   Range of Motion and Palpation Tests: Mobility is full about the elbows with flexion and extensionForearm supination and pronation are 85/85 bilaterally.  Wrist flexion/extension is 75/65 bilaterally.  Digital flexion and extension are full.  Thumb opposition is full to the base of the small fingers bilaterally.  No cords or nodules are palpated.  No triggering is observed.    Neurologic, Vascular, Motor: Sensation is diminished to light touch in the bilateral median nerve distribution.    Thenar atrophy: Mild  bilateral Tinel sign: Significantly positive bilateral carpal tunnel Carpal tunnel compression: Positive bilateral Phalen test: Positive bilateral  Motor bilateral hand FPL: 5/5 Index FDP: 5/5 APB: 4+/5   Fingers pink and well perfused.  Capillary refill is brisk.     Lab Results  Component Value Date   HGBA1C 5.1 03/15/2023     Imaging: No results found.  Past Medical/Family/Surgical/Social History: Medications & Allergies reviewed per EMR, new medications updated. 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   Cellulitis 10/06/2021   Paroxysmal atrial fibrillation (HCC) 10/06/2021   Abdominal fluid collection 10/06/2021   Constipation 09/14/2021   Ventral hernia 05/15/2021   Dysphagia 05/15/2021   Cigarette nicotine  dependence without complication 09/12/2020   Continuous dependence on cigarette smoking 08/22/2020   Mixed hyperlipidemia 04/27/2020   Recurrent incisional hernia 02/03/2020   Bilateral knee pain 12/29/2019   Coronary artery disease of native artery of native heart with stable angina pectoris (HCC) 06/25/2019   Acute respiratory failure with hypoxia (HCC) 05/20/2019   Essential hypertension 05/20/2019   COVID-19 05/20/2019   Syncope and collapse 05/20/2019   Metabolic acidosis 05/16/2019   Intractable vomiting with nausea 02/23/2019   Cocaine abuse (HCC)    Colitis 11/11/2018   Hemorrhoidal skin tag    Folate deficiency 05/11/2017   Abdominal wall fistula    Cellulitis of abdominal wall 01/02/2017   Acute left hemiparesis (HCC) 12/05/2016   Malingering  12/05/2016   Weakness of left lower extremity 12/02/2016   CVA (cerebral vascular accident) (HCC) 11/23/2016   Bright red rectal bleeding 08/22/2016   Hypotension due to blood loss    Acute GI bleeding 05/24/2016   Dieulafoy lesion of duodenum    History of Billroth II operation    Gastrointestinal hemorrhage 05/22/2016   Absolute anemia    Acute pancreatitis 10/01/2015   Alcohol abuse with intoxication    Left-sided weakness    Psychosomatic factor in physical condition    Upper GI bleed    Diverticulosis of colon with hemorrhage    Chronic wound infection of abdomen 12/22/2014   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   HTN (hypertension) 12/22/2014   Ataxia 11/01/2014   Hemorrhoids 04/26/2014   Lung nodule 04/26/2014   Diverticulosis    Gastritis    Hiatal hernia    Schatzki's ring    Acute blood loss anemia 04/25/2014   Hypokalemia 04/25/2014   Hematemesis with nausea 04/25/2014   Lung nodule < 6cm on CT 04/25/2014   Intractable nausea and vomiting 04/24/2014   Gastroparesis    Abdominal pain 04/01/2014   Gastroenteritis 12/10/2013   Chronic abdominal wound infection 08/16/2013   MDD (major depressive disorder), recurrent episode, severe (HCC) 06/27/2013   Wrist laceration 06/24/2013   Diarrhea 05/07/2013   Rectal bleeding 05/07/2013   Adrenal mass, left 05/07/2013   Post-operative wound abscess 04/19/2013   Abdominal wall abscess 04/18/2013   Essential hypertension, benign 03/30/2013   History of  cocaine abuse (HCC) 03/16/2013   History of schizoaffective disorder 03/16/2013   Bipolar disorder (HCC) 03/16/2013   Personality disorder (HCC) 03/16/2013   Tobacco use disorder 03/16/2013   Alcohol abuse 03/16/2013   Palpitations 03/16/2013   Poor vision 03/16/2013   History of gastric bypass 03/16/2013   Status post hysterectomy with oophorectomy 03/16/2013   Hypothyroid 03/16/2013   SLE (systemic lupus erythematosus) (HCC) 03/16/2013   Past Medical History:  Diagnosis  Date   Abscess    soft tissue   Adrenal mass    Alcohol abuse    Anemia    Anxiety    Blood transfusion without reported diagnosis    Chronic abdominal pain    Chronic wound infection of abdomen    Colon polyp    colonoscopy 04/2014   Depression    Discoid lupus erythematosus    Diverticulosis    colonoscopy 04/2014 moderat pan colonic   Drug-seeking behavior    Gastritis    EGD 05/2014   Gastroparesis 01/2014   GERD (gastroesophageal reflux disease)    Hemorrhoid    internal large   Hiatal hernia    History of Billroth II operation    Hypertension    Lung nodule    CT 02/2014 needs repeat 1 month   Lung nodule < 6cm on CT 04/25/2014   Lupus    Malingering    Migraine    Nausea and vomiting    chronic, recurrent   Pancreatitis    Schatzki's ring    patent per EGD 04/2014   Sickle cell trait    Stroke (HCC)    Suicide attempt (HCC)    Thyroid  disease 2000   overactive, radiation   Family History  Problem Relation Age of Onset   Drug abuse Mother    Lung cancer Father    Cancer Father        mets   Drug abuse Sister    Drug abuse Brother    Breast cancer Maternal Aunt    Bipolar disorder Maternal Aunt    Drug abuse Maternal Aunt    Colon cancer Maternal Grandmother        late 54s, early 37s   Bipolar disorder Paternal Grandfather    Brain cancer Son    Schizophrenia Son    Cancer Son        brain   Bipolar disorder Cousin    Liver disease Neg Hx    Past Surgical History:  Procedure Laterality Date   ABDOMINAL HYSTERECTOMY  2013   [place]   ABDOMINAL HYSTERECTOMY     ABDOMINAL SURGERY     ADRENALECTOMY Right    AGILE CAPSULE N/A 01/05/2015   Procedure: AGILE CAPSULE;  Surgeon: Lamar CHRISTELLA Hollingshead, MD;  [facility]: [facility];  Service: Endoscopy;  Laterality: N/A;  0700   BALLOON DILATION N/A 10/01/2021   Procedure: BALLOON DILATION;  Surgeon: Cindie Carlin POUR, DO;  Location: [facility];  Service: Endoscopy;  Laterality: N/A;   Billroth II  procedure      [place], first 2000, 2005/2006.   bilroth 2     BIOPSY  05/20/2013   Procedure: BIOPSIES OF ASCENDING AND SIGMOID COLON;  Surgeon: Lamar CHRISTELLA Hollingshead, MD;  Location: [facility];  Service: Endoscopy;;   BIOPSY  04/26/2014   Procedure: BIOPSIES;  Surgeon: Margo LITTIE Haddock, MD;  Location: [facility];  Service: Endoscopy;;   BIOPSY  12/24/2018   Procedure: BIOPSY;  Surgeon: Hollingshead Lamar CHRISTELLA, MD;  [facility];  Service: Endoscopy;;  colon   CHOLECYSTECTOMY     COLONOSCOPY     in [place]   COLONOSCOPY WITH PROPOFOL  N/A 05/20/2013   Dr.Rourk- inadequate prep, normal appearing rectum, grossly normal colon aside from pancolonic diverticula, normal terminal ileum bx= unremarkable colonic mucosa. Due for early interval 2016.    COLONOSCOPY WITH PROPOFOL  N/A 04/26/2014   DOQ:wnmfjo ileum/one colon polyp removed/moderate pan-colonic diverticulosis/large internal hemorrhoids   COLONOSCOPY WITH PROPOFOL  N/A 12/23/2014   Dr.Rourk- minimal internal hemorrhoids, pancolonic diverticulosis   COLONOSCOPY WITH PROPOFOL  N/A 08/23/2016   Procedure: COLONOSCOPY WITH PROPOFOL ;  Surgeon: Harvey Margo CROME, MD;  [facility];  Service: Endoscopy;  Laterality: N/A;   COLONOSCOPY WITH PROPOFOL  N/A 12/24/2018   Pancolonic diverticulosis, normal TI, one 5 mm polyp in rectum (tubular adenoma). Somewhat friable hemorrhagic mucosa in area of IC valve/cecum, possibly related to scope trauma s/p biopsy. Repeat in 7 years.   DEBRIDEMENT OF ABDOMINAL WALL ABSCESS N/A 02/08/2013   Procedure: DEBRIDEMENT OF ABDOMINAL WALL ABSCESS;  Surgeon: Oneil DELENA Budge, MD;  Location: [facility];  Service: General;  Laterality: N/A;   ESOPHAGOGASTRODUODENOSCOPY (EGD) WITH PROPOFOL  N/A 05/20/2013   Dr.Rourk- s/p prior gastric surgery with normal esophagus, residual gastric mucosa and patent efferent limb   ESOPHAGOGASTRODUODENOSCOPY (EGD) WITH PROPOFOL  N/A 02/03/2014   Dr. Shaaron:  s/p hemigastrectomy with retained gastric  contents. Residual gastric mucosa and efferent limb appeared normal otherwise. Query gastroparesis.    ESOPHAGOGASTRODUODENOSCOPY (EGD) WITH PROPOFOL  N/A 04/26/2014   DOQ:dryjusxp'd ring/small HH/mild non-erosive gasrtitis/normal anastomosis   ESOPHAGOGASTRODUODENOSCOPY (EGD) WITH PROPOFOL  N/A 12/23/2014   Dr.Rourk- s/p prior hemigastrctomy, active oozing from anastomotic suture site, hemostasis achieved   ESOPHAGOGASTRODUODENOSCOPY (EGD) WITH PROPOFOL  N/A 05/23/2016   Dr. 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    ESOPHAGOGASTRODUODENOSCOPY (EGD) WITH PROPOFOL  N/A 05/11/2017   Procedure: ESOPHAGOGASTRODUODENOSCOPY (EGD) WITH PROPOFOL ;  Surgeon: Shaaron Lamar HERO, MD;  [facility];  Service: Endoscopy;  Laterality: N/A;   ESOPHAGOGASTRODUODENOSCOPY (EGD) WITH PROPOFOL  N/A 06/07/2021   Procedure: ESOPHAGOGASTRODUODENOSCOPY (EGD) WITH PROPOFOL ;  Surgeon: Shaaron Lamar HERO, MD;  [facility];  Service: Endoscopy;  Laterality: N/A;  11:45am   ESOPHAGOGASTRODUODENOSCOPY (EGD) WITH PROPOFOL  N/A 10/01/2021   Procedure: ESOPHAGOGASTRODUODENOSCOPY (EGD) WITH PROPOFOL ;  Surgeon: Cindie Carlin POUR, DO;  [facility];  Service: Endoscopy;  Laterality: N/A;  3:00pm   EXCISION OF MESH  08/2019   HEMORRHOID SURGERY N/A 06/18/2017   Procedure: THREE COLUMN EXTENSIVE HEMORRHOIDECTOMY;  Surgeon: Kallie Manuelita BROCKS, MD;  Location: [facility];  Service: General;  Laterality: N/A;   HERNIA REPAIR     INCISION AND DRAINAGE ABSCESS N/A 01/06/2017   Procedure: INCISION AND DRAINAGE ABDOMINAL WALL ABSCESS;  Surgeon: Budge Oneil, MD;  Location: [facility];  Service: General;  Laterality: N/A;   incisional hernial repair  09/05/2021   [place]   LEFT HEART CATH AND CORONARY ANGIOGRAPHY N/A 05/09/2020   Procedure: LEFT HEART CATH AND CORONARY ANGIOGRAPHY;  Surgeon: Elmira Newman PARAS, MD;  [facility]: [facility];  Service: Cardiovascular;  Laterality: N/A;   LEFT HEART CATH AND CORONARY ANGIOGRAPHY N/A 08/22/2023   Procedure: LEFT HEART CATH AND CORONARY ANGIOGRAPHY;  Surgeon: Verlin Lonni BIRCH, MD;  Location: [facility];  Service: Cardiovascular;  Laterality: N/A;   POLYPECTOMY  12/24/2018   Procedure: POLYPECTOMY;  Surgeon: Shaaron Lamar HERO, MD;  [facility];  Service: Endoscopy;;  colon   tendon repar Right    wrist   WOUND EXPLORATION Right 06/24/2013   Procedure: exploration of traumatic wound right wrist;  Surgeon: Franky JONELLE Curia, MD;  [facility];  Service: Orthopedics;  Laterality: Right;   Social History   Occupational History   Not on file  Tobacco Use   Smoking status: Every Day    Current packs/day: 0.25    Average packs/day: 0.3 packs/day for 35.0 years (8.8 ttl pk-yrs)    Types: Cigarettes    Passive exposure: Past   Smokeless tobacco: Never  Vaping Use   Vaping status: Never Used  Substance and Sexual Activity   Alcohol use: Not Currently    Comment: quit in 01/2023, previously heavy   Drug use: Not Currently    Types: Cocaine    Comment: no cocaine in4  years per patient (08/20/21)   Sexual activity: Not Currently    Birth control/protection: Surgical    Paelyn Smick Estela) Arlinda, M.D. [facility], Hand Surgery

## 2023-12-09 MED ORDER — PEG 3350-KCL-NA BICARB-NACL 420 G PO SOLR: 4000.0000 mL | Freq: Once | ORAL | 0 refills | Status: AC

## 2023-12-09 NOTE — Telephone Encounter (Signed)
 Spoke with pt. She has been scheduled for 9/30 with Dr Cindie. Aware will send instructions via mychart and rx for prep to pharmacy. She has not started the iron tablets yet so aware to hold off until after procedure.

## 2023-12-16 ENCOUNTER — Ambulatory Visit (HOSPITAL_BASED_OUTPATIENT_CLINIC_OR_DEPARTMENT_OTHER): Admitting: Anesthesiology

## 2023-12-16 ENCOUNTER — Other Ambulatory Visit: Payer: Self-pay

## 2023-12-16 ENCOUNTER — Encounter (HOSPITAL_COMMUNITY): Payer: Self-pay

## 2023-12-16 ENCOUNTER — Ambulatory Visit (HOSPITAL_COMMUNITY)
Admission: RE | Admit: 2023-12-16 | Discharge: 2023-12-16 | Disposition: A | Discharge status: Home / Self Care | Source: Home / Self Care | Attending: Internal Medicine | Admitting: Internal Medicine

## 2023-12-16 ENCOUNTER — Ambulatory Visit (HOSPITAL_COMMUNITY): Admitting: Anesthesiology

## 2023-12-16 ENCOUNTER — Encounter (HOSPITAL_COMMUNITY)
Admission: RE | Disposition: A | Discharge status: Home / Self Care | Payer: Self-pay | Source: Home / Self Care | Attending: Internal Medicine

## 2023-12-16 ENCOUNTER — Emergency Department (HOSPITAL_COMMUNITY)

## 2023-12-16 ENCOUNTER — Inpatient Hospital Stay (HOSPITAL_COMMUNITY)
Admission: EM | Admit: 2023-12-16 | Discharge: 2023-12-18 | DRG: 920 | Disposition: A | Discharge status: Home / Self Care | Attending: Family Medicine | Admitting: Family Medicine

## 2023-12-16 ENCOUNTER — Encounter (HOSPITAL_COMMUNITY): Payer: Self-pay | Admitting: Internal Medicine

## 2023-12-16 DIAGNOSIS — D125 Benign neoplasm of sigmoid colon: Secondary | ICD-10-CM | POA: Diagnosis present

## 2023-12-16 DIAGNOSIS — Z885 Allergy status to narcotic agent status: Secondary | ICD-10-CM

## 2023-12-16 DIAGNOSIS — D120 Benign neoplasm of cecum: Secondary | ICD-10-CM | POA: Diagnosis not present

## 2023-12-16 DIAGNOSIS — K5900 Constipation, unspecified: Secondary | ICD-10-CM | POA: Insufficient documentation

## 2023-12-16 DIAGNOSIS — Z801 Family history of malignant neoplasm of trachea, bronchus and lung: Secondary | ICD-10-CM

## 2023-12-16 DIAGNOSIS — M059 Rheumatoid arthritis with rheumatoid factor, unspecified: Secondary | ICD-10-CM | POA: Diagnosis present

## 2023-12-16 DIAGNOSIS — R1310 Dysphagia, unspecified: Secondary | ICD-10-CM | POA: Diagnosis not present

## 2023-12-16 DIAGNOSIS — I25119 Atherosclerotic heart disease of native coronary artery with unspecified angina pectoris: Secondary | ICD-10-CM | POA: Insufficient documentation

## 2023-12-16 DIAGNOSIS — Z8673 Personal history of transient ischemic attack (TIA), and cerebral infarction without residual deficits: Secondary | ICD-10-CM | POA: Insufficient documentation

## 2023-12-16 DIAGNOSIS — Z860101 Personal history of adenomatous and serrated colon polyps: Secondary | ICD-10-CM

## 2023-12-16 DIAGNOSIS — K222 Esophageal obstruction: Secondary | ICD-10-CM | POA: Diagnosis present

## 2023-12-16 DIAGNOSIS — Z9049 Acquired absence of other specified parts of digestive tract: Secondary | ICD-10-CM

## 2023-12-16 DIAGNOSIS — Z8711 Personal history of peptic ulcer disease: Secondary | ICD-10-CM | POA: Insufficient documentation

## 2023-12-16 DIAGNOSIS — Z818 Family history of other mental and behavioral disorders: Secondary | ICD-10-CM

## 2023-12-16 DIAGNOSIS — I10 Essential (primary) hypertension: Secondary | ICD-10-CM | POA: Insufficient documentation

## 2023-12-16 DIAGNOSIS — Z9884 Bariatric surgery status: Secondary | ICD-10-CM

## 2023-12-16 DIAGNOSIS — G709 Myoneural disorder, unspecified: Secondary | ICD-10-CM | POA: Insufficient documentation

## 2023-12-16 DIAGNOSIS — F17210 Nicotine dependence, cigarettes, uncomplicated: Secondary | ICD-10-CM | POA: Diagnosis present

## 2023-12-16 DIAGNOSIS — K922 Gastrointestinal hemorrhage, unspecified: Secondary | ICD-10-CM | POA: Diagnosis not present

## 2023-12-16 DIAGNOSIS — Z888 Allergy status to other drugs, medicaments and biological substances status: Secondary | ICD-10-CM

## 2023-12-16 DIAGNOSIS — D123 Benign neoplasm of transverse colon: Secondary | ICD-10-CM | POA: Diagnosis present

## 2023-12-16 DIAGNOSIS — Z980 Intestinal bypass and anastomosis status: Secondary | ICD-10-CM | POA: Diagnosis not present

## 2023-12-16 DIAGNOSIS — K5730 Diverticulosis of large intestine without perforation or abscess without bleeding: Secondary | ICD-10-CM | POA: Diagnosis present

## 2023-12-16 DIAGNOSIS — I25118 Atherosclerotic heart disease of native coronary artery with other forms of angina pectoris: Secondary | ICD-10-CM | POA: Diagnosis not present

## 2023-12-16 DIAGNOSIS — Z803 Family history of malignant neoplasm of breast: Secondary | ICD-10-CM

## 2023-12-16 DIAGNOSIS — K219 Gastro-esophageal reflux disease without esophagitis: Secondary | ICD-10-CM | POA: Insufficient documentation

## 2023-12-16 DIAGNOSIS — F332 Major depressive disorder, recurrent severe without psychotic features: Secondary | ICD-10-CM | POA: Diagnosis present

## 2023-12-16 DIAGNOSIS — Y838 Other surgical procedures as the cause of abnormal reaction of the patient, or of later complication, without mention of misadventure at the time of the procedure: Secondary | ICD-10-CM | POA: Diagnosis present

## 2023-12-16 DIAGNOSIS — D128 Benign neoplasm of rectum: Secondary | ICD-10-CM | POA: Diagnosis not present

## 2023-12-16 DIAGNOSIS — F319 Bipolar disorder, unspecified: Secondary | ICD-10-CM | POA: Insufficient documentation

## 2023-12-16 DIAGNOSIS — Z90710 Acquired absence of both cervix and uterus: Secondary | ICD-10-CM

## 2023-12-16 DIAGNOSIS — Z808 Family history of malignant neoplasm of other organs or systems: Secondary | ICD-10-CM

## 2023-12-16 DIAGNOSIS — E039 Hypothyroidism, unspecified: Secondary | ICD-10-CM | POA: Diagnosis present

## 2023-12-16 DIAGNOSIS — D122 Benign neoplasm of ascending colon: Secondary | ICD-10-CM | POA: Diagnosis present

## 2023-12-16 DIAGNOSIS — D62 Acute posthemorrhagic anemia: Secondary | ICD-10-CM | POA: Diagnosis not present

## 2023-12-16 DIAGNOSIS — D509 Iron deficiency anemia, unspecified: Secondary | ICD-10-CM | POA: Insufficient documentation

## 2023-12-16 DIAGNOSIS — Z800 Family history of malignant neoplasm of digestive organs: Secondary | ICD-10-CM

## 2023-12-16 DIAGNOSIS — I2510 Atherosclerotic heart disease of native coronary artery without angina pectoris: Secondary | ICD-10-CM | POA: Diagnosis present

## 2023-12-16 DIAGNOSIS — D573 Sickle-cell trait: Secondary | ICD-10-CM | POA: Diagnosis present

## 2023-12-16 DIAGNOSIS — K91840 Postprocedural hemorrhage and hematoma of a digestive system organ or structure following a digestive system procedure: Secondary | ICD-10-CM | POA: Diagnosis not present

## 2023-12-16 DIAGNOSIS — Z7902 Long term (current) use of antithrombotics/antiplatelets: Secondary | ICD-10-CM

## 2023-12-16 DIAGNOSIS — Z813 Family history of other psychoactive substance abuse and dependence: Secondary | ICD-10-CM

## 2023-12-16 DIAGNOSIS — E876 Hypokalemia: Secondary | ICD-10-CM | POA: Diagnosis present

## 2023-12-16 DIAGNOSIS — K648 Other hemorrhoids: Secondary | ICD-10-CM | POA: Insufficient documentation

## 2023-12-16 DIAGNOSIS — Z7952 Long term (current) use of systemic steroids: Secondary | ICD-10-CM

## 2023-12-16 DIAGNOSIS — E538 Deficiency of other specified B group vitamins: Secondary | ICD-10-CM | POA: Diagnosis present

## 2023-12-16 DIAGNOSIS — L930 Discoid lupus erythematosus: Secondary | ICD-10-CM | POA: Diagnosis present

## 2023-12-16 DIAGNOSIS — E611 Iron deficiency: Secondary | ICD-10-CM | POA: Diagnosis present

## 2023-12-16 DIAGNOSIS — K6289 Other specified diseases of anus and rectum: Secondary | ICD-10-CM | POA: Insufficient documentation

## 2023-12-16 DIAGNOSIS — M1990 Unspecified osteoarthritis, unspecified site: Secondary | ICD-10-CM | POA: Insufficient documentation

## 2023-12-16 DIAGNOSIS — Z7982 Long term (current) use of aspirin: Secondary | ICD-10-CM

## 2023-12-16 DIAGNOSIS — Z79899 Other long term (current) drug therapy: Secondary | ICD-10-CM

## 2023-12-16 DIAGNOSIS — K625 Hemorrhage of anus and rectum: Principal | ICD-10-CM

## 2023-12-16 DIAGNOSIS — R109 Unspecified abdominal pain: Secondary | ICD-10-CM

## 2023-12-16 HISTORY — PX: COLONOSCOPY: SHX5424

## 2023-12-16 HISTORY — PX: ESOPHAGOGASTRODUODENOSCOPY: SHX5428

## 2023-12-16 HISTORY — PX: ESOPHAGEAL DILATION: SHX303

## 2023-12-16 LAB — CBC WITH DIFFERENTIAL/PLATELET
Abs Immature Granulocytes: 0.01 K/uL (ref 0.00–0.07)
Basophils Absolute: 0 K/uL (ref 0.0–0.1)
Basophils Relative: 0 %
Eosinophils Absolute: 0.1 K/uL (ref 0.0–0.5)
Eosinophils Relative: 1 %
HCT: 38.4 % (ref 36.0–46.0)
Hemoglobin: 13.5 g/dL (ref 12.0–15.0)
Immature Granulocytes: 0 %
Lymphocytes Relative: 30 %
Lymphs Abs: 2.3 K/uL (ref 0.7–4.0)
MCH: 25.5 pg — ABNORMAL LOW (ref 26.0–34.0)
MCHC: 35.2 g/dL (ref 30.0–36.0)
MCV: 72.5 fL — ABNORMAL LOW (ref 80.0–100.0)
Monocytes Absolute: 0.4 K/uL (ref 0.1–1.0)
Monocytes Relative: 5 %
Neutro Abs: 4.9 K/uL (ref 1.7–7.7)
Neutrophils Relative %: 64 %
Platelets: 391 K/uL (ref 150–400)
RBC: 5.3 MIL/uL — ABNORMAL HIGH (ref 3.87–5.11)
RDW: 17.8 % — ABNORMAL HIGH (ref 11.5–15.5)
WBC: 7.7 K/uL (ref 4.0–10.5)
nRBC: 0 % (ref 0.0–0.2)

## 2023-12-16 LAB — TYPE AND SCREEN
ABO/RH(D): O POS
Antibody Screen: NEGATIVE

## 2023-12-16 LAB — COMPREHENSIVE METABOLIC PANEL WITH GFR
ALT: 14 U/L (ref 0–44)
AST: 16 U/L (ref 15–41)
Albumin: 4 g/dL (ref 3.5–5.0)
Alkaline Phosphatase: 107 U/L (ref 38–126)
Anion gap: 12 (ref 5–15)
BUN: 7 mg/dL (ref 6–20)
CO2: 21 mmol/L — ABNORMAL LOW (ref 22–32)
Calcium: 9.3 mg/dL (ref 8.9–10.3)
Chloride: 107 mmol/L (ref 98–111)
Creatinine, Ser: 0.56 mg/dL (ref 0.44–1.00)
GFR, Estimated: >60 mL/min (ref >60)
Glucose, Bld: 102 mg/dL — ABNORMAL HIGH (ref 70–99)
Potassium: 3.3 mmol/L — ABNORMAL LOW (ref 3.5–5.1)
Sodium: 141 mmol/L (ref 135–145)
Total Bilirubin: 0.4 mg/dL (ref 0.0–1.2)
Total Protein: 6.8 g/dL (ref 6.5–8.1)

## 2023-12-16 LAB — I-STAT CHEM 8, ED
BUN: 5 mg/dL — ABNORMAL LOW (ref 6–20)
Calcium, Ion: 1.27 mmol/L (ref 1.15–1.40)
Chloride: 108 mmol/L (ref 98–111)
Creatinine, Ser: 0.5 mg/dL (ref 0.44–1.00)
Glucose, Bld: 117 mg/dL — ABNORMAL HIGH (ref 70–99)
HCT: 39 % (ref 36.0–46.0)
Hemoglobin: 13.3 g/dL (ref 12.0–15.0)
Potassium: 3.7 mmol/L (ref 3.5–5.1)
Sodium: 141 mmol/L (ref 135–145)
TCO2: 23 mmol/L (ref 22–32)

## 2023-12-16 LAB — POC OCCULT BLOOD, ED: Fecal Occult Blood: POSITIVE

## 2023-12-16 SURGERY — COLONOSCOPY
Anesthesia: General

## 2023-12-16 MED ORDER — GLYCOPYRROLATE PF 0.2 MG/ML IJ SOSY
PREFILLED_SYRINGE | INTRAMUSCULAR | Status: DC | PRN
  Administered 2023-12-16: .2 mg via INTRAVENOUS

## 2023-12-16 MED ORDER — LIDOCAINE 2% (20 MG/ML) 5 ML SYRINGE
INTRAMUSCULAR | Status: AC
  Filled 2023-12-16: qty 5

## 2023-12-16 MED ORDER — PROPOFOL 500 MG/50ML IV EMUL
INTRAVENOUS | Status: DC | PRN
  Administered 2023-12-16: 125 ug/kg/min via INTRAVENOUS

## 2023-12-16 MED ORDER — SODIUM CHLORIDE 0.9% FLUSH
3.0000 mL | Freq: Two times a day (BID) | INTRAVENOUS | Status: DC
  Administered 2023-12-17 – 2023-12-18 (×4): 3 mL via INTRAVENOUS

## 2023-12-16 MED ORDER — GLYCOPYRROLATE PF 0.2 MG/ML IJ SOSY
PREFILLED_SYRINGE | INTRAMUSCULAR | Status: AC
Start: 2023-12-16 — End: 2023-12-16
  Filled 2023-12-16: qty 2

## 2023-12-16 MED ORDER — OXYCODONE HCL 5 MG PO TABS
5.0000 mg | ORAL_TABLET | ORAL | Status: DC | PRN
  Administered 2023-12-17 – 2023-12-18 (×4): 5 mg via ORAL
  Filled 2023-12-16 (×4): qty 1

## 2023-12-16 MED ORDER — PHENYLEPHRINE 80 MCG/ML (10ML) SYRINGE FOR IV PUSH (FOR BLOOD PRESSURE SUPPORT)
PREFILLED_SYRINGE | INTRAVENOUS | Status: DC | PRN
Start: 2023-12-16 — End: 2023-12-16
  Administered 2023-12-16: 80 ug via INTRAVENOUS

## 2023-12-16 MED ORDER — POTASSIUM CHLORIDE CRYS ER 20 MEQ PO TBCR
20.0000 meq | EXTENDED_RELEASE_TABLET | Freq: Once | ORAL | Status: DC
  Filled 2023-12-16: qty 1

## 2023-12-16 MED ORDER — HYDROMORPHONE HCL 1 MG/ML IJ SOLN
0.5000 mg | INTRAMUSCULAR | Status: DC | PRN
  Administered 2023-12-17 – 2023-12-18 (×6): 0.5 mg via INTRAVENOUS
  Filled 2023-12-16 (×6): qty 0.5

## 2023-12-16 MED ORDER — DULOXETINE HCL 30 MG PO CPEP
30.0000 mg | ORAL_CAPSULE | Freq: Two times a day (BID) | ORAL | Status: DC
  Administered 2023-12-17 – 2023-12-18 (×4): 30 mg via ORAL
  Filled 2023-12-16 (×4): qty 1

## 2023-12-16 MED ORDER — PANTOPRAZOLE SODIUM 40 MG PO TBEC
40.0000 mg | DELAYED_RELEASE_TABLET | Freq: Every day | ORAL | Status: DC
  Administered 2023-12-17 – 2023-12-18 (×2): 40 mg via ORAL
  Filled 2023-12-16 (×2): qty 1

## 2023-12-16 MED ORDER — ACETAMINOPHEN 650 MG RE SUPP: 650.0000 mg | Freq: Four times a day (QID) | RECTAL | Status: DC | PRN

## 2023-12-16 MED ORDER — PROPOFOL 10 MG/ML IV BOLUS
INTRAVENOUS | Status: DC | PRN
  Administered 2023-12-16: 50 mg via INTRAVENOUS

## 2023-12-16 MED ORDER — GABAPENTIN 100 MG PO CAPS
100.0000 mg | ORAL_CAPSULE | Freq: Three times a day (TID) | ORAL | Status: DC
  Administered 2023-12-17 – 2023-12-18 (×5): 100 mg via ORAL
  Filled 2023-12-16 (×5): qty 1

## 2023-12-16 MED ORDER — HYDROXYCHLOROQUINE SULFATE 200 MG PO TABS
300.0000 mg | ORAL_TABLET | Freq: Every day | ORAL | Status: DC
  Administered 2023-12-17 – 2023-12-18 (×2): 300 mg via ORAL
  Filled 2023-12-16 (×2): qty 2

## 2023-12-16 MED ORDER — LIDOCAINE 2% (20 MG/ML) 5 ML SYRINGE
INTRAMUSCULAR | Status: DC | PRN
  Administered 2023-12-16: 80 mg via INTRAVENOUS

## 2023-12-16 MED ORDER — HYDROMORPHONE HCL 1 MG/ML IJ SOLN
0.5000 mg | Freq: Once | INTRAMUSCULAR | Status: AC
  Administered 2023-12-16: 0.5 mg via INTRAVENOUS
  Filled 2023-12-16: qty 0.5

## 2023-12-16 MED ORDER — ONDANSETRON HCL 4 MG/2ML IJ SOLN
4.0000 mg | Freq: Once | INTRAMUSCULAR | Status: AC
  Administered 2023-12-16: 4 mg via INTRAVENOUS
  Filled 2023-12-16: qty 2

## 2023-12-16 MED ORDER — IOHEXOL 300 MG/ML  SOLN
100.0000 mL | Freq: Once | INTRAMUSCULAR | Status: AC | PRN
  Administered 2023-12-16: 100 mL via INTRAVENOUS

## 2023-12-16 MED ORDER — STERILE WATER FOR IRRIGATION IR SOLN
Status: DC | PRN
  Administered 2023-12-16 (×3): 50 mL

## 2023-12-16 MED ORDER — LACTATED RINGERS IV SOLN: INTRAVENOUS | Status: DC | PRN

## 2023-12-16 MED ORDER — SODIUM CHLORIDE 0.9 % IV SOLN: INTRAVENOUS | Status: AC

## 2023-12-16 MED ORDER — ACETAMINOPHEN 325 MG PO TABS
650.0000 mg | ORAL_TABLET | Freq: Four times a day (QID) | ORAL | Status: DC | PRN
  Administered 2023-12-17: 650 mg via ORAL
  Filled 2023-12-16: qty 2

## 2023-12-16 MED ORDER — SODIUM CHLORIDE 0.9 % IV SOLN
12.5000 mg | Freq: Four times a day (QID) | INTRAVENOUS | Status: DC | PRN
  Administered 2023-12-16 – 2023-12-18 (×4): 12.5 mg via INTRAVENOUS
  Filled 2023-12-16 (×3): qty 0.5

## 2023-12-16 MED ORDER — MIRTAZAPINE 15 MG PO TABS
15.0000 mg | ORAL_TABLET | Freq: Every evening | ORAL | Status: DC | PRN
  Administered 2023-12-17: 15 mg via ORAL
  Filled 2023-12-16: qty 1

## 2023-12-16 NOTE — ED Notes (Signed)
 AC notified of need for phenergan 

## 2023-12-16 NOTE — H&P (Signed)
 History and Physical    Angel Price FMW:[id number] DOB: 1966-03-12 DOA: 12/16/2023  PCP: Carlette Benita Area, MD   Patient coming from: Home   Chief Complaint: Abdominal pain, rectal bleeding   HPI: Angel Price is a 58 y.o. female with medical history significant for hypertension, mild nonobstructive CAD, rheumatoid arthritis, depression, anxiety, PUD status post hemigastrectomy, and rectal pressure for the past year who underwent colonoscopy and upper endoscopy this morning and now presents with abdominal pain and rectal bleeding.  Patient reports that she was in her usual state prior to the procedures this morning, had some mild pain in her lower abdomen and a small amount of rectal bleeding when she returned home, but became concerned later in the day as her pain and rectal bleeding both steadily worsened.  She reports passing large volumes of red blood as well as clots per rectum and has had constant, severe pain in the lower abdomen.  She has had severe nausea associated with this but no vomiting.  ED Course: Upon arrival to the ED, patient is found to be afebrile and saturating well on room air with normal HR and stable BP.  Labs are most notable for normal BUN, normal creatinine, and normal hemoglobin.  CT of the abdomen and pelvis is concerning for active extravasation into the distal sigmoid lumen.  GI (Dr. Eartha) was consulted by the ED PA, type and screen was performed, and the patient was given Dilaudid  and Zofran .  Review of Systems:  All other systems reviewed and apart from HPI, are negative.  Past Medical History:  Diagnosis Date   Abscess    soft tissue   Adrenal mass    Alcohol abuse    Anemia    Anxiety    Blood transfusion without reported diagnosis    Chronic abdominal pain    Chronic wound infection of abdomen    Colon polyp    colonoscopy 04/2014   Depression    Discoid lupus erythematosus    Diverticulosis    colonoscopy 04/2014 moderat pan  colonic   Drug-seeking behavior    Gastritis    EGD 05/2014   Gastroparesis 01/2014   GERD (gastroesophageal reflux disease)    Hemorrhoid    internal large   Hiatal hernia    History of Billroth II operation    Hypertension    Lung nodule    CT 02/2014 needs repeat 1 month   Lung nodule < 6cm on CT 04/25/2014   Lupus    Malingering    Migraine    Nausea and vomiting    chronic, recurrent   Pancreatitis    Schatzki's ring    patent per EGD 04/2014   Sickle cell trait    Stroke [facility])    Suicide attempt (HCC)    Thyroid  disease 2000   overactive, radiation    Past Surgical History:  Procedure Laterality Date   ABDOMINAL HYSTERECTOMY  2013   [place]   ABDOMINAL HYSTERECTOMY     ABDOMINAL SURGERY     ADRENALECTOMY Right    AGILE CAPSULE N/A 01/05/2015   Procedure: AGILE CAPSULE;  Surgeon: Lamar CHRISTELLA Hollingshead, MD;  [facility];  Service: Endoscopy;  Laterality: N/A;  0700   BALLOON DILATION N/A 10/01/2021   Procedure: BALLOON DILATION;  Surgeon: Cindie Carlin POUR, DO;  [facility]: [facility];  Service: Endoscopy;  Laterality: N/A;   Billroth II procedure      [place], first 2000, 2005/2006.   bilroth 2  BIOPSY  05/20/2013   Procedure: BIOPSIES OF ASCENDING AND SIGMOID COLON;  Surgeon: Lamar CHRISTELLA Hollingshead, MD;  Location: [facility];  Service: Endoscopy;;   BIOPSY  04/26/2014   Procedure: BIOPSIES;  Surgeon: Margo LITTIE Haddock, MD;  Location: [facility];  Service: Endoscopy;;   BIOPSY  12/24/2018   Procedure: BIOPSY;  Surgeon: Hollingshead Lamar CHRISTELLA, MD;  [facility];  Service: Endoscopy;;  colon   CHOLECYSTECTOMY     COLONOSCOPY     in [place]   COLONOSCOPY WITH PROPOFOL  N/A 05/20/2013   Dr.Rourk- inadequate prep, normal appearing rectum, grossly normal colon aside from pancolonic diverticula, normal terminal ileum bx= unremarkable colonic mucosa. Due for early interval 2016.    COLONOSCOPY WITH PROPOFOL  N/A 04/26/2014   DOQ:wnmfjo ileum/one colon polyp  removed/moderate pan-colonic diverticulosis/large internal hemorrhoids   COLONOSCOPY WITH PROPOFOL  N/A 12/23/2014   Dr.Rourk- minimal internal hemorrhoids, pancolonic diverticulosis   COLONOSCOPY WITH PROPOFOL  N/A 08/23/2016   Procedure: COLONOSCOPY WITH PROPOFOL ;  Surgeon: Haddock Margo LITTIE, MD;  [facility];  Service: Endoscopy;  Laterality: N/A;   COLONOSCOPY WITH PROPOFOL  N/A 12/24/2018   Pancolonic diverticulosis, normal TI, one 5 mm polyp in rectum (tubular adenoma). Somewhat friable hemorrhagic mucosa in area of IC valve/cecum, possibly related to scope trauma s/p biopsy. Repeat in 7 years.   DEBRIDEMENT OF ABDOMINAL WALL ABSCESS N/A 02/08/2013   Procedure: DEBRIDEMENT OF ABDOMINAL WALL ABSCESS;  Surgeon: Oneil DELENA Budge, MD;  Location: [facility];  Service: General;  Laterality: N/A;   ESOPHAGOGASTRODUODENOSCOPY (EGD) WITH PROPOFOL  N/A 05/20/2013   Dr.Rourk- s/p prior gastric surgery with normal esophagus, residual gastric mucosa and patent efferent limb   ESOPHAGOGASTRODUODENOSCOPY (EGD) WITH PROPOFOL  N/A 02/03/2014   Dr. Hollingshead:  s/p hemigastrectomy with retained gastric contents. Residual gastric mucosa and efferent limb appeared normal otherwise. Query gastroparesis.    ESOPHAGOGASTRODUODENOSCOPY (EGD) WITH PROPOFOL  N/A 04/26/2014   DOQ:dryjusxp'd ring/small HH/mild non-erosive gasrtitis/normal anastomosis   ESOPHAGOGASTRODUODENOSCOPY (EGD) WITH PROPOFOL  N/A 12/23/2014   Dr.Rourk- s/p prior hemigastrctomy, active oozing from anastomotic suture site, hemostasis achieved   ESOPHAGOGASTRODUODENOSCOPY (EGD) WITH PROPOFOL  N/A 05/23/2016   Dr. 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    ESOPHAGOGASTRODUODENOSCOPY (EGD) WITH PROPOFOL  N/A 05/11/2017   Procedure: ESOPHAGOGASTRODUODENOSCOPY (EGD) WITH PROPOFOL ;  Surgeon: Hollingshead Lamar CHRISTELLA, MD;  [facility];  Service: Endoscopy;  Laterality:  N/A;   ESOPHAGOGASTRODUODENOSCOPY (EGD) WITH PROPOFOL  N/A 06/07/2021   Procedure: ESOPHAGOGASTRODUODENOSCOPY (EGD) WITH PROPOFOL ;  Surgeon: Hollingshead Lamar CHRISTELLA, MD;  [facility];  Service: Endoscopy;  Laterality: N/A;  11:45am   ESOPHAGOGASTRODUODENOSCOPY (EGD) WITH PROPOFOL  N/A 10/01/2021   Procedure: ESOPHAGOGASTRODUODENOSCOPY (EGD) WITH PROPOFOL ;  Surgeon: Cindie Carlin POUR, DO;  [facility];  Service: Endoscopy;  Laterality: N/A;  3:00pm   EXCISION OF MESH  08/2019   HEMORRHOID SURGERY N/A 06/18/2017   Procedure: THREE COLUMN EXTENSIVE HEMORRHOIDECTOMY;  Surgeon: Kallie Manuelita BROCKS, MD;  Location: [facility];  Service: General;  Laterality: N/A;   HERNIA REPAIR     INCISION AND DRAINAGE ABSCESS N/A 01/06/2017   Procedure: INCISION AND DRAINAGE ABDOMINAL WALL ABSCESS;  Surgeon: Budge Oneil, MD;  Location: [facility];  Service: General;  Laterality: N/A;   incisional hernial repair  09/05/2021   [place]   LEFT HEART CATH AND CORONARY ANGIOGRAPHY N/A 05/09/2020   Procedure: LEFT HEART CATH AND CORONARY ANGIOGRAPHY;  Surgeon: Elmira Newman PARAS, MD;  [facility]: [facility];  Service: Cardiovascular;  Laterality: N/A;   LEFT HEART CATH AND CORONARY ANGIOGRAPHY N/A 08/22/2023   Procedure: LEFT HEART CATH AND CORONARY ANGIOGRAPHY;  Surgeon: Verlin Lonni BIRCH, MD;  Location: [facility];  Service: Cardiovascular;  Laterality: N/A;   POLYPECTOMY  12/24/2018   Procedure: POLYPECTOMY;  Surgeon: Shaaron Lamar HERO, MD;  [facility];  Service: Endoscopy;;  colon   tendon repar Right    wrist   WOUND EXPLORATION Right 06/24/2013   Procedure: exploration of traumatic wound right wrist;  Surgeon: Franky JONELLE Curia, MD;  Location: [facility];  Service: Orthopedics;  Laterality: Right;    Social History:   reports that she has been smoking cigarettes. She has a 8.8 pack-year smoking history. She has been exposed to tobacco smoke. She has never used smokeless tobacco. She  reports that she does not currently use alcohol. She reports that she does not currently use drugs after having used the following drugs: Cocaine.  Allergies  Allergen Reactions   Metoclopramide  Anaphylaxis and Other (See Comments)    Reglan  EPS symptoms  Jerking movements   Codeine Hives    Family History  Problem Relation Age of Onset   Drug abuse Mother    Lung cancer Father    Cancer Father        mets   Drug abuse Sister    Drug abuse Brother    Breast cancer Maternal Aunt    Bipolar disorder Maternal Aunt    Drug abuse Maternal Aunt    Colon cancer Maternal Grandmother        late 6s, early 41s   Bipolar disorder Paternal Grandfather    Brain cancer Son    Schizophrenia Son    Cancer Son        brain   Bipolar disorder Cousin    Liver disease Neg Hx      Prior to Admission medications   Medication Sig Start Date End Date Taking? Authorizing Provider  amLODipine  (NORVASC ) 10 MG tablet Take 10 mg by mouth daily. 11/18/23  Yes [provider]  aspirin  EC 81 MG EC tablet Take 1 tablet (81 mg total) by mouth daily. 05/19/19  Yes Elgergawy, Brayton RAMAN, MD  atenolol  (TENORMIN ) 50 MG tablet Take 50 mg by mouth daily. 10/17/23  Yes [provider]  Cholecalciferol  50 MCG (2000 UT) CAPS Take 1 tablet by mouth daily.   Yes [provider]  Cyanocobalamin  (B-12) 1000 MCG SUBL Place 1,000 mcg under the tongue daily. 11/26/23  Yes Ezzard Sonny RAMAN, PA-C  DULoxetine  (CYMBALTA ) 30 MG capsule Take 30 mg by mouth 2 (two) times daily. 08/14/21  Yes [provider]  gabapentin  (NEURONTIN ) 100 MG capsule Take 100 mg by mouth 3 (three) times daily. 03/03/23  Yes [provider]  HYDROcodone -acetaminophen  (NORCO/VICODIN) 5-325 MG tablet Take 1 tablet by mouth 2 (two) times daily.   Yes [provider]  hydroxychloroquine  (PLAQUENIL ) 200 MG tablet Take 1.5 tablets (300 mg total) by mouth daily. 11/03/23  Yes Rice, Lonni ORN, MD  mirtazapine   (REMERON ) 15 MG tablet Take 15 mg by mouth at bedtime as needed (for sleep). 08/10/21  Yes [provider]  nitroGLYCERIN  (NITROSTAT ) 0.4 MG SL tablet Place 1 tablet (0.4 mg total) under the tongue every 5 (five) minutes as needed for chest pain. 12/29/19 12/16/23 Yes Patwardhan, Newman PARAS, MD  pantoprazole  (PROTONIX ) 40 MG tablet Take 1 tablet (40 mg total) by mouth daily before breakfast. 08/05/23  Yes Ezzard,  Sonny RAMAN, PA-C  polyethylene glycol-electrolytes (NULYTELY ) 420 g solution Take 4,000 mLs by mouth once. 12/09/23  Yes [provider]  predniSONE  (DELTASONE ) 5 MG tablet Take 5 mg by mouth daily with breakfast. 11/18/23  Yes [provider]  promethazine  (PHENERGAN ) 25 MG tablet Take 12.5 mg by mouth every 6 (six) hours as needed for nausea or vomiting. 10/21/23  Yes [provider]  thiamine  (VITAMIN B1) 100 MG tablet Take 100 mg by mouth daily. 04/26/23  Yes [provider]  tiZANidine (ZANAFLEX) 2 MG tablet Take 2 mg by mouth at bedtime. 10/17/23  Yes [provider]  clopidogrel  (PLAVIX ) 75 MG tablet Take 75 mg by mouth daily. Patient not taking: Reported on 12/16/2023 12/12/23   [provider]  hydrocortisone  (ANUSOL -HC) 2.5 % rectal cream Apply 1 Application topically 2 (two) times daily. Patient not taking: Reported on 12/16/2023 11/18/23   [provider]    Physical Exam: Vitals:   12/16/23 1945 12/16/23 2045 12/16/23 2100 12/16/23 2248  BP: (!) 140/90 135/87 121/83 114/89  Pulse: 80 63 75 71  Resp: 18 17 17 18   Temp: 98 F (36.7 C) 98 F (36.7 C)    TempSrc:      SpO2: 100% 97% 93% 95%  Weight:      Height:         Constitutional: NAD, no pallor or diaphoresis  Eyes: PERTLA, lids and conjunctivae normal ENMT: Mucous membranes are moist. Posterior pharynx clear of any exudate or lesions.   Neck: supple, no masses  Respiratory: no wheezing, no crackles. No accessory muscle use.  Cardiovascular: S1 & S2 heard,  regular rate and rhythm. No extremity edema.  Abdomen: Soft, no guarding. Bowel sounds active.  Musculoskeletal: no clubbing / cyanosis. No joint deformity upper and lower extremities.   Skin: no significant rashes, lesions, ulcers. Warm, dry, well-perfused. Neurologic: CN 2-12 grossly intact. Moving all extremities. Alert and oriented.  Psychiatric: Calm. Cooperative.    Labs and Imaging on Admission: I have personally reviewed following labs and imaging studies  CBC: Recent Labs  Lab 12/16/23 1802 12/16/23 2232  WBC 7.7  --   NEUTROABS 4.9  --   HGB 13.5 13.3  HCT 38.4 39.0  MCV 72.5*  --   PLT 391  --    Basic Metabolic Panel: Recent Labs  Lab 12/16/23 1802 12/16/23 2232  NA 141 141  K 3.3* 3.7  CL 107 108  CO2 21*  --   GLUCOSE 102* 117*  BUN 7 5*  CREATININE 0.56 0.50  CALCIUM  9.3  --    GFR: Estimated Creatinine Clearance: 76.6 mL/min (by C-G formula based on SCr of 0.5 mg/dL). Liver Function Tests: Recent Labs  Lab 12/16/23 1802  AST 16  ALT 14  ALKPHOS 107  BILITOT 0.4  PROT 6.8  ALBUMIN 4.0   No results for input(s): LIPASE, AMYLASE in the last 168 hours. No results for input(s): AMMONIA  in the last 168 hours. Coagulation Profile: No results for input(s): INR, PROTIME in the last 168 hours. Cardiac Enzymes: No results for input(s): CKTOTAL, CKMB, CKMBINDEX, TROPONINI in the last 168 hours. BNP (last 3 results) No results for input(s): PROBNP in the last 8760 hours. HbA1C: No results for input(s): HGBA1C in the last 72 hours. CBG: No results for input(s): GLUCAP in the last 168 hours. Lipid Profile: No results for input(s): CHOL, HDL, LDLCALC, TRIG, CHOLHDL, LDLDIRECT in the last 72 hours. Thyroid  Function Tests: No results for input(s): TSH,  T4TOTAL, FREET4, T3FREE, THYROIDAB in the last 72 hours. Anemia Panel: No results for input(s): VITAMINB12, FOLATE, FERRITIN, TIBC, IRON,  RETICCTPCT in the last 72 hours. Urine analysis:    Component Value Date/Time   COLORURINE YELLOW 09/09/2023 2259   APPEARANCEUR CLEAR 09/09/2023 2259   LABSPEC 1.021 09/09/2023 2259   PHURINE 6.0 09/09/2023 2259   GLUCOSEU NEGATIVE 09/09/2023 2259   HGBUR NEGATIVE 09/09/2023 2259   BILIRUBINUR NEGATIVE 09/09/2023 2259   KETONESUR NEGATIVE 09/09/2023 2259   PROTEINUR NEGATIVE 09/09/2023 2259   UROBILINOGEN 0.2 11/26/2014 0915   NITRITE NEGATIVE 09/09/2023 2259   LEUKOCYTESUR NEGATIVE 09/09/2023 2259   Sepsis Labs: @LABRCNTIP (procalcitonin:4,lacticidven:4) )No results found for this or any previous visit (from the past 240 hours).   Radiological Exams on Admission: CT ABDOMEN PELVIS W CONTRAST Result Date: 12/16/2023 CLINICAL DATA:  Rectal bleeding post colonoscopy EXAM: CT ABDOMEN AND PELVIS WITH CONTRAST TECHNIQUE: Multidetector CT imaging of the abdomen and pelvis was performed using the standard protocol following bolus administration of intravenous contrast. RADIATION DOSE REDUCTION: This exam was performed according to the departmental dose-optimization program which includes automated exposure control, adjustment of the mA and/or kV according to patient size and/or use of iterative reconstruction technique. CONTRAST:  OMNIPAQUE  IOHEXOL  300 MG/ML  SOLN COMPARISON:  CT 03/26/2023, 02/21/2023 FINDINGS: Lower chest: Lung bases are clear Hepatobiliary: Cholecystectomy. Mild intra and extrahepatic biliary dilatation as before, common bile duct measures up to 14 mm. Pancreas: Unremarkable. No pancreatic ductal dilatation or surrounding inflammatory changes. Spleen: Normal in size without focal abnormality. Adrenals/Urinary Tract: Multiple surgical clips in the right suprarenal region. Right adrenal gland is surgically absent. Stable 2.1 cm left adrenal mass. Kidneys show no hydronephrosis. The bladder is unremarkable Stomach/Bowel: Postsurgical changes of the stomach and small bowel. No  evidence for acute bowel wall thickening or bowel obstruction. Globular contrast within the lumen of the distal sigmoid colon, series 2, image 70, appears to augments on delayed image, series 6, image 70 and would be consistent with active bleeding. Vascular/Lymphatic: Aortic atherosclerosis. No enlarged abdominal or pelvic lymph nodes. Reproductive: Status post hysterectomy. No adnexal masses. Other: Negative for pelvic effusion or free air Musculoskeletal: No acute or suspicious osseous lesion. Postsurgical scarring of the ventral abdominal wall IMPRESSION: 1. Positive for active extravasation/bleeding within the lumen of the distal sigmoid colon. 2. Postsurgical changes of the stomach and small bowel. No evidence for bowel obstruction or bowel wall thickening. 3. Stable 2.1 cm left adrenal mass/adenoma. 4. Aortic atherosclerosis. Critical Value/emergent results were called by telephone at the time of interpretation on 12/16/2023 at 9:51 pm to provider COLLIN HINNANT , who verbally acknowledged these results. Aortic Atherosclerosis (ICD10-I70.0). Electronically Signed   By: Luke Bun M.D.   On: 12/16/2023 21:51    Assessment/Plan   1. Postpolypectomy bleeding  - GI consulted by ED PA  - Continue bowel rest, trend H&H, supportive care   2. Hypertension  - Hold antihypertensives given concern for active bleed - Treat as-needed only for now    3. CAD  - No anginal symptoms  - Hold antiplatelets for now    4. RA  - Managed with Plaquenil      5. Depression  - Continue Cymbalta , Remeron    6. Hypokalemia  - Replacing     DVT prophylaxis: SCDs  Code Status: Full  Level of Care: Level of care: Telemetry Family Communication: none present  Disposition Plan:  Patient is from: Home  Anticipated d/c is to: Home  Anticipated d/c date  is: 10/1 or 12/18/23  Patient currently: Pending stable H&H  Consults called: GI  Admission status: Observation     Evalene GORMAN Sprinkles, MD [facility]  12/16/2023, 11:09 PM

## 2023-12-16 NOTE — Anesthesia Postprocedure Evaluation (Signed)
 Anesthesia Post Note  Patient: Angel Price  Procedure(s) Performed: COLONOSCOPY EGD (ESOPHAGOGASTRODUODENOSCOPY) DILATION, ESOPHAGUS  Patient location during evaluation: Endoscopy Anesthesia Type: General Level of consciousness: awake and alert Pain management: pain level controlled Vital Signs Assessment: post-procedure vital signs reviewed and stable Respiratory status: spontaneous breathing, nonlabored ventilation and respiratory function stable Cardiovascular status: stable Anesthetic complications: no   There were no known notable events for this encounter.   Last Vitals:  Vitals:   12/16/23 0702 12/16/23 0929  BP: (!) 125/90 (!) 141/56  Pulse: 69 72  Resp: 15 15  Temp: 36.5 C 36.7 C  SpO2: 96% 98%    Last Pain:  Vitals:   12/16/23 0929  TempSrc: Oral  PainSc: 0-No pain                 Mamadou Breon L Lilliah Priego

## 2023-12-16 NOTE — ED Triage Notes (Signed)
 Pt arrived via POV c/o rectal bleeding that the Pt reports she noticed began after returning home following a colonoscopy earlier today. Pt reports she had 12 polyps removed. Pt endorses 10 out of 10 lower abdominal pain as well. Pt also reports seeing large clots pass with the blood from her rectum as well.

## 2023-12-16 NOTE — ED Notes (Signed)
 Patient transported to CT

## 2023-12-16 NOTE — Transfer of Care (Signed)
 Immediate Anesthesia Transfer of Care Note  Patient: Angel Price  Procedure(s) Performed: COLONOSCOPY EGD (ESOPHAGOGASTRODUODENOSCOPY) DILATION, ESOPHAGUS  Patient Location: PACU  Anesthesia Type:MAC  Level of Consciousness: awake and alert   Airway & Oxygen  Therapy: Patient Spontanous Breathing and Patient connected to nasal cannula oxygen   Post-op Assessment: Report given to RN and Post -op Vital signs reviewed and stable  Post vital signs: Reviewed and stable  Last Vitals:  Vitals Value Taken Time  BP    Temp    Pulse    Resp    SpO2      Last Pain:  Vitals:   12/16/23 0840  TempSrc:   PainSc: 0-No pain      Patients Stated Pain Goal: 7 (12/16/23 0702)  Complications: No notable events documented.

## 2023-12-16 NOTE — ED Notes (Signed)
 Pt has bed ready on 300 informed to just send phenergan  to admission room

## 2023-12-16 NOTE — Op Note (Signed)
 [facility] Patient Name: Angel Price Procedure Date: 12/16/2023 8:14 AM MRN: [id number] Date of Birth: 05-20-1965 Attending MD: Carlin POUR. Cindie , [place], [id number] CSN: [id number] Age: 58 Admit Type: Outpatient Procedure:                Colonoscopy Indications:              Rectal pain/pressure, Iron deficiency Providers:                Carlin POUR. Cindie, DO, Jon LABOR. Gerome RN, RN,                            [place] Wilson, Technician Referring MD:              Medicines:                See the Anesthesia note for documentation of the                            administered medications Complications:            No immediate complications. Estimated Blood Loss:     Estimated blood loss was minimal. Procedure:                Pre-Anesthesia Assessment:                           - The anesthesia plan was to use monitored                            anesthesia care (MAC).                           After obtaining informed consent, the colonoscope                            was passed under direct vision. Throughout the                            procedure, the patient's blood pressure, pulse, and                            oxygen  saturations were monitored continuously. The                            [id number] ([id number]) was introduced through the                            anus and advanced to the the cecum, identified by                            appendiceal orifice and ileocecal valve. The                            colonoscopy was performed without difficulty. The                            patient tolerated the procedure well.  The quality                            of the bowel preparation was evaluated using the                            BBPS [facility] Bowel Preparation Scale) with scores                            of: Right Colon = 3, Transverse Colon = 3 and Left                            Colon = 3 (entire mucosa seen well with no residual                             staining, small fragments of stool or opaque                            liquid). The total BBPS score equals 9. Scope In: 8:56:47 AM Scope Out: 9:22:45 AM Scope Withdrawal Time: 0 hours 23 minutes 1 second  Total Procedure Duration: 0 hours 25 minutes 58 seconds  Findings:      Non-bleeding internal hemorrhoids were found during retroflexion.      Scattered large-mouthed and small-mouthed diverticula were found in the       entire colon.      Four sessile polyps were found in the ascending colon and cecum. The       polyps were 4 to 7 mm in size. These polyps were removed with a cold       snare. Resection and retrieval were complete.      Five sessile polyps were found in the transverse colon. The polyps were       4 to 8 mm in size. These polyps were removed with a cold snare.       Resection and retrieval were complete.      Three pedunculated and sessile polyps were found in the rectum and       sigmoid colon. The polyps were 5 to 10 mm in size. These polyps were       removed with a cold snare. Resection and retrieval were complete. Impression:               - Non-bleeding internal hemorrhoids.                           - Diverticulosis in the entire examined colon.                           - Four 4 to 7 mm polyps in the ascending colon and                            in the cecum, removed with a cold snare. Resected                            and retrieved.                           -  Five 4 to 8 mm polyps in the transverse colon,                            removed with a cold snare. Resected and retrieved.                           - Three 5 to 10 mm polyps in the rectum and in the                            sigmoid colon, removed with a cold snare. Resected                            and retrieved. Moderate Sedation:      Per Anesthesia Care Recommendation:           - Patient has a contact number available for                            emergencies. The signs and symptoms of  potential                            delayed complications were discussed with the                            patient. Return to normal activities tomorrow.                            Written discharge instructions were provided to the                            patient.                           - Resume previous diet.                           - Continue present medications.                           - Await pathology results.                           - Repeat colonoscopy in 1 year for surveillance.                           - Return to GI clinic in 3 months. Procedure Code(s):        --- Professional ---                           [phone], Colonoscopy, flexible; with removal of                            tumor(s), polyp(s), or other lesion(s) by snare  technique Diagnosis Code(s):        --- Professional ---                           K64.8, Other hemorrhoids                           D12.2, Benign neoplasm of ascending colon                           D12.0, Benign neoplasm of cecum                           D12.3, Benign neoplasm of transverse colon (hepatic                            flexure or splenic flexure)                           D12.8, Benign neoplasm of rectum                           D12.5, Benign neoplasm of sigmoid colon                           K62.89, Other specified diseases of anus and rectum                           K57.30, Diverticulosis of large intestine without                            perforation or abscess without bleeding CPT copyright 2022 American Medical Association. All rights reserved. The codes documented in this report are preliminary and upon coder review may  be revised to meet current compliance requirements. Carlin POUR. Cindie, DO Carlin POUR. Cindie, DO 12/16/2023 9:28:33 AM This report has been signed electronically. Number of Addenda: 0

## 2023-12-16 NOTE — Anesthesia Preprocedure Evaluation (Addendum)
 Anesthesia Evaluation  Patient identified by MRN, date of birth, ID band Patient awake    Reviewed: Allergy & Precautions, H&P , NPO status , Patient's Chart, lab work & pertinent test results, reviewed documented beta blocker date and time   Airway Mallampati: II  TM Distance: >3 FB Neck ROM: full    Dental  (+) Dental Advisory Given, Caps, Missing,  One upper front is a cap and the other is missing:   Pulmonary Current Smoker and Patient abstained from smoking.   Pulmonary exam normal breath sounds clear to auscultation       Cardiovascular Exercise Tolerance: Good hypertension, + angina  + CAD  Normal cardiovascular exam Rhythm:regular Rate:Normal  Cath this year showed mild CAD ( non obstructive ) and good LV function   Neuro/Psych  Headaches PSYCHIATRIC DISORDERS Anxiety Depression Bipolar Disorder    Neuromuscular disease CVA    GI/Hepatic Neg liver ROS, hiatal hernia,GERD  ,,  Endo/Other  Hypothyroidism    Renal/GU negative Renal ROS  negative genitourinary   Musculoskeletal  (+) Arthritis , Osteoarthritis,    Abdominal   Peds  Hematology   Anesthesia Other Findings   Reproductive/Obstetrics negative OB ROS                              Anesthesia Physical Anesthesia Plan  ASA: 3  Anesthesia Plan: General   Post-op Pain Management: Minimal or no pain anticipated   Induction:   PONV Risk Score and Plan: Propofol  infusion  Airway Management Planned: Natural Airway and Nasal Cannula  Additional Equipment: None  Intra-op Plan:   Post-operative Plan:   Informed Consent: I have reviewed the patients History and Physical, chart, labs and discussed the procedure including the risks, benefits and alternatives for the proposed anesthesia with the patient or authorized representative who has indicated his/her understanding and acceptance.     Dental Advisory Given  Plan  Discussed with: CRNA  Anesthesia Plan Comments:          Anesthesia Quick Evaluation

## 2023-12-16 NOTE — Discharge Instructions (Signed)
 EGD Discharge instructions Please read the instructions outlined below and refer to this sheet in the next few weeks. These discharge instructions provide you with general information on caring for yourself after you leave the hospital. Your doctor may also give you specific instructions. While your treatment has been planned according to the most current medical practices available, unavoidable complications occasionally occur. If you have any problems or questions after discharge, please call your doctor. ACTIVITY You may resume your regular activity but move at a slower pace for the next 24 hours.  Take frequent rest periods for the next 24 hours.  Walking will help expel (get rid of) the air and reduce the bloated feeling in your abdomen.  No driving for 24 hours (because of the anesthesia (medicine) used during the test).  You may shower.  Do not sign any important legal documents or operate any machinery for 24 hours (because of the anesthesia used during the test).  NUTRITION Drink plenty of fluids.  You may resume your normal diet.  Begin with a light meal and progress to your normal diet.  Avoid alcoholic beverages for 24 hours or as instructed by your caregiver.  MEDICATIONS You may resume your normal medications unless your caregiver tells you otherwise.  WHAT YOU CAN EXPECT TODAY You may experience abdominal discomfort such as a feeling of fullness or "gas" pains.  FOLLOW-UP Your doctor will discuss the results of your test with you.  SEEK IMMEDIATE MEDICAL ATTENTION IF ANY OF THE FOLLOWING OCCUR: Excessive nausea (feeling sick to your stomach) and/or vomiting.  Severe abdominal pain and distention (swelling).  Trouble swallowing.  Temperature over 101 F (37.8 C).  Rectal bleeding or vomiting of blood.     Colonoscopy Discharge Instructions  Read the instructions outlined below and refer to this sheet in the next few weeks. These discharge instructions provide you with  general information on caring for yourself after you leave the hospital. Your doctor may also give you specific instructions. While your treatment has been planned according to the most current medical practices available, unavoidable complications occasionally occur.   ACTIVITY You may resume your regular activity, but move at a slower pace for the next 24 hours.  Take frequent rest periods for the next 24 hours.  Walking will help get rid of the air and reduce the bloated feeling in your belly (abdomen).  No driving for 24 hours (because of the medicine (anesthesia) used during the test).   Do not sign any important legal documents or operate any machinery for 24 hours (because of the anesthesia used during the test).  NUTRITION Drink plenty of fluids.  You may resume your normal diet as instructed by your doctor.  Begin with a light meal and progress to your normal diet. Heavy or fried foods are harder to digest and may make you feel sick to your stomach (nauseated).  Avoid alcoholic beverages for 24 hours or as instructed.  MEDICATIONS You may resume your normal medications unless your doctor tells you otherwise.  WHAT YOU CAN EXPECT TODAY Some feelings of bloating in the abdomen.  Passage of more gas than usual.  Spotting of blood in your stool or on the toilet paper.  IF YOU HAD POLYPS REMOVED DURING THE COLONOSCOPY: No aspirin  products for 7 days or as instructed.  No alcohol for 7 days or as instructed.  Eat a soft diet for the next 24 hours.  FINDING OUT THE RESULTS OF YOUR TEST Not all test results are  available during your visit. If your test results are not back during the visit, make an appointment with your caregiver to find out the results. Do not assume everything is normal if you have not heard from your caregiver or the medical facility. It is important for you to follow up on all of your test results.  SEEK IMMEDIATE MEDICAL ATTENTION IF: You have more than a spotting of  blood in your stool.  Your belly is swollen (abdominal distention).  You are nauseated or vomiting.  You have a temperature over 101.  You have abdominal pain or discomfort that is severe or gets worse throughout the day.   Your upper endoscopy revealed mild tightening of your esophagus called a Schatzki's ring.  This is a benign fibrous ring related to acid reflux.  I stretched this out today.  Hopefully this helps with feeling of food getting stuck.  I also took samples of your esophagus.  Evaluation of your stomach and small bowel revealed intact previous gastric surgery.  No ulcerations or inflammation.  Appeared very healthy.  Continue on pantoprazole  daily.  Your colonoscopy revealed 12 polyp(s) which I removed successfully. Await pathology results, my office will contact you. I recommend repeating colonoscopy in 1 year for surveillance purposes.   You also have diverticulosis and internal hemorrhoids. I would recommend increasing fiber in your diet or adding OTC Benefiber/Metamucil. Be sure to drink at least 4 to 6 glasses of water  daily.   Follow-up with GI in 3 months   I hope you have a great rest of your week!  Carlin POUR. Cindie, D.O. Gastroenterology and Hepatology [facility]

## 2023-12-16 NOTE — ED Notes (Signed)
 Pt c/o nausea, says zofran  doesn't work for her EDP made aware.

## 2023-12-16 NOTE — ED Provider Notes (Signed)
 [place] EMERGENCY DEPARTMENT AT [facility] Provider Note   CSN: [id number] Arrival date & time: 12/16/23  1650     Patient presents with: Rectal Bleeding   Angel Price is a 58 y.o. female who presents to the emergency department with a chief complaint of rectal bleeding.  Patient had a colonoscopy completed earlier today by Dr. Cindie and tolerated the procedure well however after discharge patient states that she began to develop 10 out of 10 abdominal pain as well as bright red blood per her rectum.  Patient states that she has had approximately 10 bowel movements today all containing bright red blood.  Patient denies blood thinning medication and states that she held her aspirin  prior to the colonoscopy procedure.  Denies fever, chills, shortness of breath.  Past medical history significant for history for cocaine abuse, history of schizoaffective disorder, bipolar disorder, personality disorder, alcohol abuse, history of gastric bypass, status post hysterectomy, lupus, hypertension, intractable nausea and vomiting, GI bleed, CVA, paroxysmal atrial fibrillation, rheumatoid arthritis, etc.    Rectal Bleeding      Prior to Admission medications   Medication Sig Start Date End Date Taking? Authorizing Provider  amLODipine  (NORVASC ) 10 MG tablet Take 10 mg by mouth daily. 11/18/23  Yes [provider]  aspirin  EC 81 MG EC tablet Take 1 tablet (81 mg total) by mouth daily. 05/19/19  Yes Elgergawy, Brayton RAMAN, MD  atenolol  (TENORMIN ) 50 MG tablet Take 50 mg by mouth daily. 10/17/23  Yes [provider]  Cholecalciferol  50 MCG (2000 UT) CAPS Take 1 tablet by mouth daily.   Yes [provider]  Cyanocobalamin  (B-12) 1000 MCG SUBL Place 1,000 mcg under the tongue daily. 11/26/23  Yes Ezzard Sonny RAMAN, PA-C  DULoxetine  (CYMBALTA ) 30 MG capsule Take 30 mg by mouth 2 (two) times daily. 08/14/21  Yes [provider]  gabapentin  (NEURONTIN ) 100 MG capsule  Take 100 mg by mouth 3 (three) times daily. 03/03/23  Yes [provider]  HYDROcodone -acetaminophen  (NORCO/VICODIN) 5-325 MG tablet Take 1 tablet by mouth 2 (two) times daily.   Yes [provider]  hydroxychloroquine  (PLAQUENIL ) 200 MG tablet Take 1.5 tablets (300 mg total) by mouth daily. 11/03/23  Yes Rice, Lonni ORN, MD  mirtazapine  (REMERON ) 15 MG tablet Take 15 mg by mouth at bedtime as needed (for sleep). 08/10/21  Yes [provider]  nitroGLYCERIN  (NITROSTAT ) 0.4 MG SL tablet Place 1 tablet (0.4 mg total) under the tongue every 5 (five) minutes as needed for chest pain. 12/29/19 12/16/23 Yes Patwardhan, Newman PARAS, MD  pantoprazole  (PROTONIX ) 40 MG tablet Take 1 tablet (40 mg total) by mouth daily before breakfast. 08/05/23  Yes Ezzard Sonny RAMAN, PA-C  polyethylene glycol-electrolytes (NULYTELY ) 420 g solution Take 4,000 mLs by mouth once. 12/09/23  Yes [provider]  predniSONE  (DELTASONE ) 5 MG tablet Take 5 mg by mouth daily with breakfast. 11/18/23  Yes [provider]  promethazine  (PHENERGAN ) 25 MG tablet Take 12.5 mg by mouth every 6 (six) hours as needed for nausea or vomiting. 10/21/23  Yes [provider]  thiamine  (VITAMIN B1) 100 MG tablet Take 100 mg by mouth daily. 04/26/23  Yes [provider]  tiZANidine (ZANAFLEX) 2 MG tablet Take 2 mg by mouth at bedtime. 10/17/23  Yes [provider]  clopidogrel  (PLAVIX ) 75 MG tablet Take 75 mg by mouth daily. Patient not taking: Reported on 12/16/2023 12/12/23   [provider]  hydrocortisone  (ANUSOL -HC) 2.5 % rectal  cream Apply 1 Application topically 2 (two) times daily. Patient not taking: Reported on 12/16/2023 11/18/23   [provider]    Allergies: Metoclopramide  and Codeine    Review of Systems  Gastrointestinal:  Positive for hematochezia.    Updated Vital Signs BP (!) 111/90 (BP Location: Right Arm)   Pulse 67   Temp 97.9 F (36.6 C) (Oral)    Resp 18   Ht 5' 3 (1.6 m)   Wt 75.8 kg   LMP  (LMP Unknown)   SpO2 97%   BMI 29.58 kg/m   Physical Exam Vitals and nursing note reviewed.  Constitutional:      General: She is awake. She is not in acute distress.    Appearance: Normal appearance. She is not ill-appearing, toxic-appearing or diaphoretic.  HENT:     Head: Normocephalic and atraumatic.  Eyes:     General: No scleral icterus. Cardiovascular:     Rate and Rhythm: Normal rate and regular rhythm.  Pulmonary:     Effort: Pulmonary effort is normal. No respiratory distress.     Breath sounds: No wheezing, rhonchi or rales.  Abdominal:     General: Abdomen is flat. There is no distension.     Tenderness: There is abdominal tenderness (Periumbilical tenderness worse with palpation). There is no right CVA tenderness, left CVA tenderness, guarding or rebound.  Musculoskeletal:        General: Normal range of motion.     Right lower leg: No edema.     Left lower leg: No edema.  Skin:    General: Skin is warm.     Capillary Refill: Capillary refill takes less than 2 seconds.     Comments: Well-healed scars present to abdomen  Neurological:     General: No focal deficit present.     Mental Status: She is alert and oriented to person, place, and time.  Psychiatric:        Mood and Affect: Mood normal.        Behavior: Behavior normal. Behavior is cooperative.     (all labs ordered are listed, but only abnormal results are displayed) Labs Reviewed  CBC WITH DIFFERENTIAL/PLATELET - Abnormal; Notable for the following components:      Result Value   RBC 5.30 (*)    MCV 72.5 (*)    MCH 25.5 (*)    RDW 17.8 (*)    All other components within normal limits  COMPREHENSIVE METABOLIC PANEL WITH GFR - Abnormal; Notable for the following components:   Potassium 3.3 (*)    CO2 21 (*)    Glucose, Bld 102 (*)    All other components within normal limits  CBC - Abnormal; Notable for the following components:   RBC 5.40  (*)    MCV 75.9 (*)    MCH 25.4 (*)    RDW 19.0 (*)    All other components within normal limits  POC OCCULT BLOOD, ED - Abnormal  I-STAT CHEM 8, ED - Abnormal; Notable for the following components:   BUN 5 (*)    Glucose, Bld 117 (*)    All other components within normal limits  BASIC METABOLIC PANEL WITH GFR  MAGNESIUM   CBC  CBC  CBC  TYPE AND SCREEN    EKG: None  Radiology: CT ABDOMEN PELVIS W CONTRAST Result Date: 12/16/2023 CLINICAL DATA:  Rectal bleeding post colonoscopy EXAM: CT ABDOMEN AND PELVIS WITH CONTRAST TECHNIQUE: Multidetector CT imaging of the abdomen and pelvis was performed using  the standard protocol following bolus administration of intravenous contrast. RADIATION DOSE REDUCTION: This exam was performed according to the departmental dose-optimization program which includes automated exposure control, adjustment of the mA and/or kV according to patient size and/or use of iterative reconstruction technique. CONTRAST:  OMNIPAQUE  IOHEXOL  300 MG/ML  SOLN COMPARISON:  CT 03/26/2023, 02/21/2023 FINDINGS: Lower chest: Lung bases are clear Hepatobiliary: Cholecystectomy. Mild intra and extrahepatic biliary dilatation as before, common bile duct measures up to 14 mm. Pancreas: Unremarkable. No pancreatic ductal dilatation or surrounding inflammatory changes. Spleen: Normal in size without focal abnormality. Adrenals/Urinary Tract: Multiple surgical clips in the right suprarenal region. Right adrenal gland is surgically absent. Stable 2.1 cm left adrenal mass. Kidneys show no hydronephrosis. The bladder is unremarkable Stomach/Bowel: Postsurgical changes of the stomach and small bowel. No evidence for acute bowel wall thickening or bowel obstruction. Globular contrast within the lumen of the distal sigmoid colon, series 2, image 70, appears to augments on delayed image, series 6, image 70 and would be consistent with active bleeding. Vascular/Lymphatic: Aortic atherosclerosis.  No enlarged abdominal or pelvic lymph nodes. Reproductive: Status post hysterectomy. No adnexal masses. Other: Negative for pelvic effusion or free air Musculoskeletal: No acute or suspicious osseous lesion. Postsurgical scarring of the ventral abdominal wall IMPRESSION: 1. Positive for active extravasation/bleeding within the lumen of the distal sigmoid colon. 2. Postsurgical changes of the stomach and small bowel. No evidence for bowel obstruction or bowel wall thickening. 3. Stable 2.1 cm left adrenal mass/adenoma. 4. Aortic atherosclerosis. Critical Value/emergent results were called by telephone at the time of interpretation on 12/16/2023 at 9:51 pm to provider Stepanie Graver , who verbally acknowledged these results. Aortic Atherosclerosis (ICD10-I70.0). Electronically Signed   By: Luke Bun M.D.   On: 12/16/2023 21:51     Procedures   Medications Ordered in the ED  promethazine  (PHENERGAN ) 12.5 mg in sodium chloride  0.9 % 50 mL IVPB (12.5 mg Intravenous New Bag/Given 12/16/23 2349)  hydroxychloroquine  (PLAQUENIL ) tablet 300 mg (has no administration in time range)  DULoxetine  (CYMBALTA ) DR capsule 30 mg (30 mg Oral Given 12/17/23 0009)  mirtazapine  (REMERON ) tablet 15 mg (15 mg Oral Given 12/17/23 0011)  pantoprazole  (PROTONIX ) EC tablet 40 mg (has no administration in time range)  gabapentin  (NEURONTIN ) capsule 100 mg (has no administration in time range)  potassium chloride  SA (KLOR-CON  M) CR tablet 20 mEq (20 mEq Oral Patient Refused/Not Given 12/17/23 0010)  sodium chloride  flush (NS) 0.9 % injection 3 mL (3 mLs Intravenous Given 12/17/23 0010)  acetaminophen  (TYLENOL ) tablet 650 mg (has no administration in time range)    Or  acetaminophen  (TYLENOL ) suppository 650 mg (has no administration in time range)  oxyCODONE  (Oxy IR/ROXICODONE ) immediate release tablet 5 mg (5 mg Oral Given 12/17/23 0011)  HYDROmorphone  (DILAUDID ) injection 0.5 mg (has no administration in time range)  0.9 %   sodium chloride  infusion ( Intravenous New Bag/Given 12/16/23 2347)  HYDROmorphone  (DILAUDID ) injection 0.5 mg (0.5 mg Intravenous Given 12/16/23 2049)  ondansetron  (ZOFRAN ) injection 4 mg (4 mg Intravenous Given 12/16/23 2049)  iohexol  (OMNIPAQUE ) 300 MG/ML solution 100 mL (100 mLs Intravenous Contrast Given 12/16/23 2115)  HYDROmorphone  (DILAUDID ) injection 0.5 mg (0.5 mg Intravenous Given 12/16/23 2301)                                    Medical Decision Making Amount and/or Complexity of Data Reviewed Labs: ordered. Radiology:  ordered.  Risk Prescription drug management. Decision regarding hospitalization.   Patient presents to the ED for concern of rectal bleeding, this involves an extensive number of treatment options, and is a complaint that carries with it a high risk of complications and morbidity.  The differential diagnosis includes hemorrhoids, GI bleed, diverticulosis, procedural complication, infectious cause, etc.   Co morbidities that complicate the patient evaluation  cocaine abuse, history of schizoaffective disorder, bipolar disorder, personality disorder, alcohol abuse, history of gastric bypass, status post hysterectomy, lupus, hypertension, intractable nausea and vomiting, GI bleed, CVA, paroxysmal atrial fibrillation, rheumatoid arthritis   Additional history obtained:  Reviewed where patient had colonoscopy procedure completed earlier today by Dr. Cindie  Lab Tests:  I Ordered, and personally interpreted labs.  The pertinent results include: CBC reassuring with hemoglobin of 13.7, repeat i-STAT Chem-8 shows hemoglobin of 13.3, CMP unremarkable, point-of-care occult blood positive   Imaging Studies ordered:  I ordered imaging studies including the abdomen pelvis I independently visualized and interpreted imaging which showed active bleeding in the sigmoid colon I agree with the radiologist interpretation   Cardiac Monitoring:  The patient was maintained  on a cardiac monitor.  I personally viewed and interpreted the cardiac monitored which showed an underlying rhythm of: Sinus rhythm   Medicines ordered and prescription drug management:  I ordered medication including Dilaudid  for pain, Zofran  and Phenergan  for nausea  Reevaluation of the patient after these medicines showed that the patient improved I have reviewed the patients home medicines and have made adjustments as needed   Test Considered:  None   Critical Interventions:  None   Consultations Obtained:  I requested consultation with the GI team and hospitalist team ,  and discussed lab and imaging findings as well as pertinent plan - they recommend: Admission for ongoing diagnosis and treatment   Problem List / ED Course:  58 year old female, vital signs stable, colonoscopy earlier today since then bright red blood in stool, 10 out of 10 abdominal pain On physical exam tenderness present to periumbilical region, Hemoccult positive, however no gross red bright red blood present on rectal examination, patient does however state that every time she has a bowel movement she does have bright red blood, patient denies blood thinner Lab work significant for seemingly stable hemoglobin of 13.5, point-of-care Hemoccult positive, type and screen completed CT abdomen pelvis ordered due to 10 out of 10 abdominal pain, called by radiologist Dr. Scott with critical result that patient seems to have an active bleed of the sigmoid colon Spoke with GI Dr. Eartha he recommends admission, clear liquid diet for now and then n.p.o. at midnight Spoke with hospitalist Dr. Charlton who agrees with admission for ongoing diagnosis and treatment, repeat i-STAT Chem-8 shows stable hemoglobin at 13.3 On reassessment patient overall well-appearing, patient states pain is improved, at this time I feel patient is stable for admission Most likely diagnosis at this time is lower GI bleed due to possible  colonoscopy complication, patient hemodynamically stable at this time   Reevaluation:  After the interventions noted above, I reevaluated the patient and found that they have :stayed the same   Social Determinants of Health:  none   Dispostion:  After consideration of the diagnostic results and the patients response to treatment, I feel that the patient would benefit from admission for ongoing diagnosis and treatment.       Final diagnoses:  Rectal bleeding  Lower GI bleed  Abdominal pain, unspecified abdominal location    ED  Discharge Orders     None          Janetta Terrall FALCON, PA-C 12/17/23 0045    Melvenia Motto, MD 12/17/23 [phone]

## 2023-12-16 NOTE — Op Note (Signed)
 [facility] Patient Name: Angel Price Procedure Date: 12/16/2023 8:19 AM MRN: [id number] Date of Birth: [birth year] Attending MD: Carlin POUR. Cindie , [place], [id number] CSN: [id number] Age: 58 Admit Type: Outpatient Procedure:                Upper GI endoscopy Indications:              Dysphagia, Heartburn Providers:                Carlin POUR. Cindie, DO, Jon LABOR. Gerome RN, RN,                            [place] Wilson, Technician Referring MD:              Medicines:                See the Anesthesia note for documentation of the                            administered medications Complications:            No immediate complications. Estimated Blood Loss:     Estimated blood loss was minimal. Procedure:                Pre-Anesthesia Assessment:                           - The anesthesia plan was to use monitored                            anesthesia care (MAC).                           After obtaining informed consent, the endoscope was                            passed under direct vision. Throughout the                            procedure, the patient's blood pressure, pulse, and                            oxygen  saturations were monitored continuously. The                            [id number] ([id number]) scope was introduced through                            the mouth, and advanced to the jejunum. The upper                            GI endoscopy was accomplished without difficulty.                            The patient tolerated the procedure well. Scope In: 8:45:15 AM Scope Out: 8:49:58 AM Total Procedure Duration: 0 hours 4 minutes 43 seconds  Findings:      The Z-line was regular and was found 39  cm from the incisors.      A mild Schatzki ring was found in the distal esophagus. A TTS dilator       was passed through the scope. Dilation with an 18-19-20 mm balloon       dilator was performed to 20 mm. The dilation site was examined and       showed moderate improvement  in luminal narrowing.      Biopsies were taken with a cold forceps in the middle third of the       esophagus for histology.      Evidence of a patent Billroth II gastrojejunostomy was found. The       gastrojejunal anastomosis was characterized by healthy appearing mucosa.       This was traversed. The efferent limb was examined.      The examined jejunum was normal. Impression:               - Z-line regular, 39 cm from the incisors.                           - Mild Schatzki ring. Dilated.                           - Patent Billroth II gastrojejunostomy was found,                            characterized by healthy appearing mucosa.                           - Normal examined jejunum.                           - Biopsies were taken with a cold forceps for                            histology in the middle third of the esophagus. Moderate Sedation:      Per Anesthesia Care Recommendation:           - Patient has a contact number available for                            emergencies. The signs and symptoms of potential                            delayed complications were discussed with the                            patient. Return to normal activities tomorrow.                            Written discharge instructions were provided to the                            patient.                           - Resume previous diet.                           -  Continue present medications.                           - Await pathology results.                           - Repeat upper endoscopy PRN for retreatment.                           - Return to GI clinic in 3 months.                           - Use Protonix  (pantoprazole ) 40 mg PO daily. Procedure Code(s):        --- Professional ---                           [phone], Esophagogastroduodenoscopy, flexible,                            transoral; with transendoscopic balloon dilation of                            esophagus (less than 30 mm  diameter)                           43239, 59, Esophagogastroduodenoscopy, flexible,                            transoral; with biopsy, single or multiple Diagnosis Code(s):        --- Professional ---                           K22.2, Esophageal obstruction                           Z98.0, Intestinal bypass and anastomosis status                           R13.10, Dysphagia, unspecified                           R12, Heartburn CPT copyright 2022 [employer]. All rights reserved. The codes documented in this report are preliminary and upon coder review may  be revised to meet current compliance requirements. Carlin POUR. Cindie, DO Carlin POUR. Cindie, DO 12/16/2023 9:24:27 AM This report has been signed electronically. Number of Addenda: 0

## 2023-12-16 NOTE — ED Notes (Signed)
 Pt in CT at this time.

## 2023-12-16 NOTE — Interval H&P Note (Signed)
 History and Physical Interval Note:  12/16/2023 8:27 AM  Angel Price  has presented today for surgery, with the diagnosis of rectal pain, ida, atypical chest pain, h/o dysphagia.  The various methods of treatment have been discussed with the patient and family. After consideration of risks, benefits and other options for treatment, the patient has consented to  Procedure(s) with comments: COLONOSCOPY (N/A) - 145pm, ok rm 1 EGD (ESOPHAGOGASTRODUODENOSCOPY) (N/A) DILATION, ESOPHAGUS (N/A) as a surgical intervention.  The patient's history has been reviewed, patient examined, no change in status, stable for surgery.  I have reviewed the patient's chart and labs.  Questions were answered to the patient's satisfaction.     Carlin MARLA Hasty

## 2023-12-17 ENCOUNTER — Inpatient Hospital Stay (HOSPITAL_COMMUNITY): Admitting: Anesthesiology

## 2023-12-17 ENCOUNTER — Encounter (HOSPITAL_COMMUNITY)
Admission: EM | Disposition: A | Discharge status: Home / Self Care | Payer: Self-pay | Source: Home / Self Care | Attending: Family Medicine

## 2023-12-17 ENCOUNTER — Encounter (HOSPITAL_COMMUNITY): Payer: Self-pay | Admitting: Internal Medicine

## 2023-12-17 DIAGNOSIS — K922 Gastrointestinal hemorrhage, unspecified: Secondary | ICD-10-CM | POA: Diagnosis not present

## 2023-12-17 DIAGNOSIS — Z885 Allergy status to narcotic agent status: Secondary | ICD-10-CM | POA: Diagnosis not present

## 2023-12-17 DIAGNOSIS — I10 Essential (primary) hypertension: Secondary | ICD-10-CM

## 2023-12-17 DIAGNOSIS — Z9884 Bariatric surgery status: Secondary | ICD-10-CM | POA: Diagnosis not present

## 2023-12-17 DIAGNOSIS — K921 Melena: Secondary | ICD-10-CM

## 2023-12-17 DIAGNOSIS — Z8673 Personal history of transient ischemic attack (TIA), and cerebral infarction without residual deficits: Secondary | ICD-10-CM | POA: Diagnosis not present

## 2023-12-17 DIAGNOSIS — Y838 Other surgical procedures as the cause of abnormal reaction of the patient, or of later complication, without mention of misadventure at the time of the procedure: Secondary | ICD-10-CM | POA: Diagnosis present

## 2023-12-17 DIAGNOSIS — Z90710 Acquired absence of both cervix and uterus: Secondary | ICD-10-CM | POA: Diagnosis not present

## 2023-12-17 DIAGNOSIS — Z801 Family history of malignant neoplasm of trachea, bronchus and lung: Secondary | ICD-10-CM | POA: Diagnosis not present

## 2023-12-17 DIAGNOSIS — Z803 Family history of malignant neoplasm of breast: Secondary | ICD-10-CM | POA: Diagnosis not present

## 2023-12-17 DIAGNOSIS — K222 Esophageal obstruction: Secondary | ICD-10-CM | POA: Diagnosis present

## 2023-12-17 DIAGNOSIS — M059 Rheumatoid arthritis with rheumatoid factor, unspecified: Secondary | ICD-10-CM | POA: Diagnosis present

## 2023-12-17 DIAGNOSIS — F332 Major depressive disorder, recurrent severe without psychotic features: Secondary | ICD-10-CM | POA: Diagnosis not present

## 2023-12-17 DIAGNOSIS — D573 Sickle-cell trait: Secondary | ICD-10-CM | POA: Diagnosis present

## 2023-12-17 DIAGNOSIS — Z9049 Acquired absence of other specified parts of digestive tract: Secondary | ICD-10-CM | POA: Diagnosis not present

## 2023-12-17 DIAGNOSIS — E876 Hypokalemia: Secondary | ICD-10-CM | POA: Diagnosis present

## 2023-12-17 DIAGNOSIS — K91840 Postprocedural hemorrhage and hematoma of a digestive system organ or structure following a digestive system procedure: Principal | ICD-10-CM

## 2023-12-17 DIAGNOSIS — F17210 Nicotine dependence, cigarettes, uncomplicated: Secondary | ICD-10-CM

## 2023-12-17 DIAGNOSIS — Z7982 Long term (current) use of aspirin: Secondary | ICD-10-CM | POA: Diagnosis not present

## 2023-12-17 DIAGNOSIS — F319 Bipolar disorder, unspecified: Secondary | ICD-10-CM | POA: Diagnosis present

## 2023-12-17 DIAGNOSIS — Z813 Family history of other psychoactive substance abuse and dependence: Secondary | ICD-10-CM | POA: Diagnosis not present

## 2023-12-17 DIAGNOSIS — Z7902 Long term (current) use of antithrombotics/antiplatelets: Secondary | ICD-10-CM | POA: Diagnosis not present

## 2023-12-17 DIAGNOSIS — L930 Discoid lupus erythematosus: Secondary | ICD-10-CM | POA: Diagnosis present

## 2023-12-17 DIAGNOSIS — I25118 Atherosclerotic heart disease of native coronary artery with other forms of angina pectoris: Secondary | ICD-10-CM | POA: Diagnosis not present

## 2023-12-17 DIAGNOSIS — D62 Acute posthemorrhagic anemia: Secondary | ICD-10-CM | POA: Diagnosis not present

## 2023-12-17 DIAGNOSIS — Z888 Allergy status to other drugs, medicaments and biological substances status: Secondary | ICD-10-CM | POA: Diagnosis not present

## 2023-12-17 DIAGNOSIS — E039 Hypothyroidism, unspecified: Secondary | ICD-10-CM | POA: Diagnosis present

## 2023-12-17 DIAGNOSIS — E538 Deficiency of other specified B group vitamins: Secondary | ICD-10-CM | POA: Diagnosis present

## 2023-12-17 DIAGNOSIS — I2510 Atherosclerotic heart disease of native coronary artery without angina pectoris: Secondary | ICD-10-CM | POA: Diagnosis present

## 2023-12-17 HISTORY — PX: FLEXIBLE SIGMOIDOSCOPY: SHX5431

## 2023-12-17 LAB — CBC
HCT: 32.5 % — ABNORMAL LOW (ref 36.0–46.0)
HCT: 34.5 % — ABNORMAL LOW (ref 36.0–46.0)
HCT: 34.6 % — ABNORMAL LOW (ref 36.0–46.0)
HCT: 41 % (ref 36.0–46.0)
Hemoglobin: 11 g/dL — ABNORMAL LOW (ref 12.0–15.0)
Hemoglobin: 11.9 g/dL — ABNORMAL LOW (ref 12.0–15.0)
Hemoglobin: 12.2 g/dL (ref 12.0–15.0)
Hemoglobin: 13.7 g/dL (ref 12.0–15.0)
MCH: 24.6 pg — ABNORMAL LOW (ref 26.0–34.0)
MCH: 25.4 pg — ABNORMAL LOW (ref 26.0–34.0)
MCH: 25.6 pg — ABNORMAL LOW (ref 26.0–34.0)
MCH: 25.7 pg — ABNORMAL LOW (ref 26.0–34.0)
MCHC: 33.4 g/dL (ref 30.0–36.0)
MCHC: 33.8 g/dL (ref 30.0–36.0)
MCHC: 34.5 g/dL (ref 30.0–36.0)
MCHC: 35.3 g/dL (ref 30.0–36.0)
MCV: 72.7 fL — ABNORMAL LOW (ref 80.0–100.0)
MCV: 72.7 fL — ABNORMAL LOW (ref 80.0–100.0)
MCV: 74.5 fL — ABNORMAL LOW (ref 80.0–100.0)
MCV: 75.9 fL — ABNORMAL LOW (ref 80.0–100.0)
Platelets: 344 K/uL (ref 150–400)
Platelets: 357 K/uL (ref 150–400)
Platelets: 360 K/uL (ref 150–400)
Platelets: 371 K/uL (ref 150–400)
RBC: 4.47 MIL/uL (ref 3.87–5.11)
RBC: 4.63 MIL/uL (ref 3.87–5.11)
RBC: 4.76 MIL/uL (ref 3.87–5.11)
RBC: 5.4 MIL/uL — ABNORMAL HIGH (ref 3.87–5.11)
RDW: 17.6 % — ABNORMAL HIGH (ref 11.5–15.5)
RDW: 17.7 % — ABNORMAL HIGH (ref 11.5–15.5)
RDW: 18.3 % — ABNORMAL HIGH (ref 11.5–15.5)
RDW: 19 % — ABNORMAL HIGH (ref 11.5–15.5)
WBC: 10.6 K/uL — ABNORMAL HIGH (ref 4.0–10.5)
WBC: 8.4 K/uL (ref 4.0–10.5)
WBC: 9.5 K/uL (ref 4.0–10.5)
WBC: 9.5 K/uL (ref 4.0–10.5)
nRBC: 0 % (ref 0.0–0.2)
nRBC: 0 % (ref 0.0–0.2)
nRBC: 0 % (ref 0.0–0.2)
nRBC: 0 % (ref 0.0–0.2)

## 2023-12-17 LAB — BASIC METABOLIC PANEL WITH GFR
Anion gap: 17 — ABNORMAL HIGH (ref 5–15)
BUN: 7 mg/dL (ref 6–20)
CO2: 12 mmol/L — ABNORMAL LOW (ref 22–32)
Calcium: 9.6 mg/dL (ref 8.9–10.3)
Chloride: 109 mmol/L (ref 98–111)
Creatinine, Ser: 0.53 mg/dL (ref 0.44–1.00)
GFR, Estimated: >60 mL/min (ref >60)
Glucose, Bld: 136 mg/dL — ABNORMAL HIGH (ref 70–99)
Potassium: 3.9 mmol/L (ref 3.5–5.1)
Sodium: 138 mmol/L (ref 135–145)

## 2023-12-17 LAB — SURGICAL PATHOLOGY

## 2023-12-17 LAB — MAGNESIUM: Magnesium: 2.4 mg/dL (ref 1.7–2.4)

## 2023-12-17 SURGERY — SIGMOIDOSCOPY, FLEXIBLE
Anesthesia: General

## 2023-12-17 MED ORDER — LACTATED RINGERS IV SOLN: INTRAVENOUS | Status: DC

## 2023-12-17 MED ORDER — INFLUENZA VIRUS VACC SPLIT PF (FLUZONE) 0.5 ML IM SUSY
0.5000 mL | PREFILLED_SYRINGE | INTRAMUSCULAR | Status: DC
  Filled 2023-12-17: qty 0.5

## 2023-12-17 MED ORDER — PROPOFOL 500 MG/50ML IV EMUL
INTRAVENOUS | Status: DC | PRN
Start: 2023-12-17 — End: 2023-12-17
  Administered 2023-12-17 (×3): 20 mg via INTRAVENOUS
  Administered 2023-12-17: 100 mg via INTRAVENOUS

## 2023-12-17 MED ORDER — LIDOCAINE 2% (20 MG/ML) 5 ML SYRINGE
INTRAMUSCULAR | Status: DC | PRN
  Administered 2023-12-17: 60 mg via INTRAVENOUS

## 2023-12-17 MED ORDER — SODIUM CHLORIDE 0.9 % IV SOLN: INTRAVENOUS | Status: DC

## 2023-12-17 MED ORDER — PHENYLEPHRINE HCL (PRESSORS) 10 MG/ML IV SOLN
INTRAVENOUS | Status: DC | PRN
Start: 2023-12-17 — End: 2023-12-17
  Administered 2023-12-17 (×2): 160 ug via INTRAVENOUS

## 2023-12-17 MED ORDER — ORAL CARE MOUTH RINSE: 15.0000 mL | OROMUCOSAL | Status: DC | PRN

## 2023-12-17 MED ORDER — PROMETHAZINE HCL 25 MG/ML IJ SOLN
INTRAMUSCULAR | Status: AC
  Filled 2023-12-17: qty 1

## 2023-12-17 NOTE — Progress Notes (Signed)
   12/17/23 1101  TOC Brief Assessment  Insurance and Status Reviewed  Patient has primary care physician Yes  Home environment has been reviewed Home alone  Prior level of function: Independent  Prior/Current Home Services No current home services  Social Drivers of Health Review SDOH reviewed no interventions necessary  Readmission risk has been reviewed Yes  Transition of care needs no transition of care needs at this time   Inpatient Care Manager (ICM) has reviewed patient and no ICM needs have been identified at this time. We will continue to monitor patient advancement through interdisciplinary progression rounds. If new patient transition needs arise, please place a ICM consult.

## 2023-12-17 NOTE — Consult Note (Signed)
 Gastroenterology Consult   Referring Provider: No ref. provider found Primary Care Physician:  Carlette Benita Area, MD Primary Gastroenterologist:  Dr. Cindie   Patient ID: Angel Price; [id number]; [birth year]   Admit date: 12/16/2023  LOS: 0 days   Date of Consultation: 12/17/2023  Reason for Consultation:  rectal bleeding s/p colonoscopy with polypectomy  History of Present Illness   Angel Price is a 58 y.o. year old female with history of anxiety, anemia, alcohol abuse, depression, gastroparesis, GERD, hemorrhoids, hiatal hernia, HTN, lupus, migraines, pancreatitis, stroke, thyroid  disease who presented to the ED yesterday with c/o abdominal pain and rectal bleeding after colonoscopy earlier in the day. She reported 10 bloody BMs. GI consulted for further evaluation  ED Course: Heme positive stool Hgb 13.5 on arrival  CT A/P with active bleed of the sigmoid colon  BP 111/90 last night, last reading 91/74   Consult: Hgb 13.7 last night, 12.2 this morning (likely hemodilution in setting of ongoing IVFs)  States she had a small amount of rectal bleeding after procedure but went home and as the day went on she continued to have multiple BMs with more BRB noted in the toilet. She notes stools were solid. She was seeing some blood dripping out into the toilet. She had some abdominal pain after the procedure which persisted and worsened over the course of the day. She has some nausea but no vomiting. Feels pain is about the same as it was yesterday. She notes continued rectal bleeding this morning x2. She has attempted to have 2 BMs but notes no real presence of stool. She notes some dizziness with exertion. No blood thinners or NSAIDs.    Colonoscopy 12/16/23 - Non-bleeding internal hemorrhoids.                           - Diverticulosis in the entire examined colon.                           - Four 4 to 7 mm polyps in the ascending colon and                            in  the cecum, removed with a cold snare. Resected                            and retrieved.                           - Five 4 to 8 mm polyps in the transverse colon,                            removed with a cold snare. Resected and retrieved.                           - Three 5 to 10 mm polyps in the rectum and in the                            sigmoid colon, removed with a cold snare. Resected  and retrieved  (Recommended repeat TCS 1 year)  Past Medical History:  Diagnosis Date   Abscess    soft tissue   Adrenal mass    Alcohol abuse    Anemia    Anxiety    Blood transfusion without reported diagnosis    Chronic abdominal pain    Chronic wound infection of abdomen    Colon polyp    colonoscopy 04/2014   Depression    Discoid lupus erythematosus    Diverticulosis    colonoscopy 04/2014 moderat pan colonic   Drug-seeking behavior    Gastritis    EGD 05/2014   Gastroparesis 01/2014   GERD (gastroesophageal reflux disease)    Hemorrhoid    internal large   Hiatal hernia    History of Billroth II operation    Hypertension    Lung nodule    CT 02/2014 needs repeat 1 month   Lung nodule < 6cm on CT 04/25/2014   Lupus    Malingering    Migraine    Nausea and vomiting    chronic, recurrent   Pancreatitis    Schatzki's ring    patent per EGD 04/2014   Sickle cell trait    Stroke (HCC)    Suicide attempt (HCC)    Thyroid  disease 2000   overactive, radiation    Past Surgical History:  Procedure Laterality Date   ABDOMINAL HYSTERECTOMY  2013   [place]   ABDOMINAL HYSTERECTOMY     ABDOMINAL SURGERY     ADRENALECTOMY Right    AGILE CAPSULE N/A 01/05/2015   Procedure: AGILE CAPSULE;  Surgeon: Lamar CHRISTELLA Hollingshead, MD;  [facility];  Service: Endoscopy;  Laterality: N/A;  0700   BALLOON DILATION N/A 10/01/2021   Procedure: BALLOON DILATION;  Surgeon: Cindie Carlin POUR, DO;  [facility];  Service: Endoscopy;  Laterality: N/A;    Billroth II procedure      [place], first 2000, 2005/2006.   bilroth 2     BIOPSY  05/20/2013   Procedure: BIOPSIES OF ASCENDING AND SIGMOID COLON;  Surgeon: Lamar CHRISTELLA Hollingshead, MD;  Location: [facility];  Service: Endoscopy;;   BIOPSY  04/26/2014   Procedure: BIOPSIES;  Surgeon: Margo LITTIE Haddock, MD;  Location: [facility];  Service: Endoscopy;;   BIOPSY  12/24/2018   Procedure: BIOPSY;  Surgeon: Hollingshead Lamar CHRISTELLA, MD;  [facility];  Service: Endoscopy;;  colon   CHOLECYSTECTOMY     COLONOSCOPY     in [place]   COLONOSCOPY WITH PROPOFOL  N/A 05/20/2013   Dr.Rourk- inadequate prep, normal appearing rectum, grossly normal colon aside from pancolonic diverticula, normal terminal ileum bx= unremarkable colonic mucosa. Due for early interval 2016.    COLONOSCOPY WITH PROPOFOL  N/A 04/26/2014   DOQ:wnmfjo ileum/one colon polyp removed/moderate pan-colonic diverticulosis/large internal hemorrhoids   COLONOSCOPY WITH PROPOFOL  N/A 12/23/2014   Dr.Rourk- minimal internal hemorrhoids, pancolonic diverticulosis   COLONOSCOPY WITH PROPOFOL  N/A 08/23/2016   Procedure: COLONOSCOPY WITH PROPOFOL ;  Surgeon: Haddock Margo LITTIE, MD;  [facility];  Service: Endoscopy;  Laterality: N/A;   COLONOSCOPY WITH PROPOFOL  N/A 12/24/2018   Pancolonic diverticulosis, normal TI, one 5 mm polyp in rectum (tubular adenoma). Somewhat friable hemorrhagic mucosa in area of IC valve/cecum, possibly related to scope trauma s/p biopsy. Repeat in 7 years.   DEBRIDEMENT OF ABDOMINAL WALL ABSCESS N/A 02/08/2013   Procedure: DEBRIDEMENT OF ABDOMINAL WALL ABSCESS;  Surgeon: Oneil DELENA Budge, MD;  Location: [facility];  Service: General;  Laterality: N/A;   ESOPHAGOGASTRODUODENOSCOPY (EGD) WITH PROPOFOL  N/A 05/20/2013   Dr.Rourk- s/p prior gastric surgery with normal esophagus, residual gastric mucosa and patent efferent limb   ESOPHAGOGASTRODUODENOSCOPY (EGD) WITH PROPOFOL  N/A 02/03/2014   Dr. Shaaron:  s/p hemigastrectomy with retained  gastric contents. Residual gastric mucosa and efferent limb appeared normal otherwise. Query gastroparesis.    ESOPHAGOGASTRODUODENOSCOPY (EGD) WITH PROPOFOL  N/A 04/26/2014   DOQ:dryjusxp'd ring/small HH/mild non-erosive gasrtitis/normal anastomosis   ESOPHAGOGASTRODUODENOSCOPY (EGD) WITH PROPOFOL  N/A 12/23/2014   Dr.Rourk- s/p prior hemigastrctomy, active oozing from anastomotic suture site, hemostasis achieved   ESOPHAGOGASTRODUODENOSCOPY (EGD) WITH PROPOFOL  N/A 05/23/2016   Dr. 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    ESOPHAGOGASTRODUODENOSCOPY (EGD) WITH PROPOFOL  N/A 05/11/2017   Procedure: ESOPHAGOGASTRODUODENOSCOPY (EGD) WITH PROPOFOL ;  Surgeon: Shaaron Lamar HERO, MD;  [facility];  Service: Endoscopy;  Laterality: N/A;   ESOPHAGOGASTRODUODENOSCOPY (EGD) WITH PROPOFOL  N/A 06/07/2021   Procedure: ESOPHAGOGASTRODUODENOSCOPY (EGD) WITH PROPOFOL ;  Surgeon: Shaaron Lamar HERO, MD;  [facility];  Service: Endoscopy;  Laterality: N/A;  11:45am   ESOPHAGOGASTRODUODENOSCOPY (EGD) WITH PROPOFOL  N/A 10/01/2021   Procedure: ESOPHAGOGASTRODUODENOSCOPY (EGD) WITH PROPOFOL ;  Surgeon: Cindie Carlin POUR, DO;  [facility];  Service: Endoscopy;  Laterality: N/A;  3:00pm   EXCISION OF MESH  08/2019   HEMORRHOID SURGERY N/A 06/18/2017   Procedure: THREE COLUMN EXTENSIVE HEMORRHOIDECTOMY;  Surgeon: Kallie Manuelita BROCKS, MD;  Location: [facility];  Service: General;  Laterality: N/A;   HERNIA REPAIR     INCISION AND DRAINAGE ABSCESS N/A 01/06/2017   Procedure: INCISION AND DRAINAGE ABDOMINAL WALL ABSCESS;  Surgeon: Mavis Anes, MD;  Location: [facility];  Service: General;  Laterality: N/A;   incisional hernial repair  09/05/2021   [place]   LEFT HEART CATH AND CORONARY ANGIOGRAPHY N/A 05/09/2020   Procedure: LEFT HEART CATH AND CORONARY ANGIOGRAPHY;  Surgeon: Elmira Newman PARAS, MD;   [facility];  Service: Cardiovascular;  Laterality: N/A;   LEFT HEART CATH AND CORONARY ANGIOGRAPHY N/A 08/22/2023   Procedure: LEFT HEART CATH AND CORONARY ANGIOGRAPHY;  Surgeon: Verlin Lonni BIRCH, MD;  Location: [facility];  Service: Cardiovascular;  Laterality: N/A;   POLYPECTOMY  12/24/2018   Procedure: POLYPECTOMY;  Surgeon: Shaaron Lamar HERO, MD;  [facility];  Service: Endoscopy;;  colon   tendon repar Right    wrist   WOUND EXPLORATION Right 06/24/2013   Procedure: exploration of traumatic wound right wrist;  Surgeon: Franky JONELLE Curia, MD;  [facility];  Service: Orthopedics;  Laterality: Right;    Prior to Admission medications   Medication Sig Start Date End Date Taking? Authorizing Provider  amLODipine  (NORVASC ) 10 MG tablet Take 10 mg by mouth daily. 11/18/23  Yes [provider]  aspirin  EC 81 MG EC tablet Take 1 tablet (81 mg total) by mouth daily. 05/19/19  Yes Elgergawy, Brayton RAMAN, MD  atenolol  (TENORMIN ) 50 MG tablet Take 50 mg by mouth daily. 10/17/23  Yes [provider]  Cholecalciferol  50 MCG (2000 UT) CAPS Take 1 tablet by mouth daily.   Yes [provider]  Cyanocobalamin  (B-12) 1000 MCG SUBL Place 1,000 mcg under the tongue daily. 11/26/23  Yes Ezzard Sonny RAMAN, PA-C  DULoxetine  (CYMBALTA ) 30 MG capsule Take 30 mg by mouth 2 (two) times daily. 08/14/21  Yes [provider]  gabapentin  (NEURONTIN ) 100 MG capsule Take 100 mg by mouth  3 (three) times daily. 03/03/23  Yes [provider]  HYDROcodone -acetaminophen  (NORCO/VICODIN) 5-325 MG tablet Take 1 tablet by mouth 2 (two) times daily.   Yes [provider]  hydroxychloroquine  (PLAQUENIL ) 200 MG tablet Take 1.5 tablets (300 mg total) by mouth daily. 11/03/23  Yes Rice, Lonni ORN, MD  mirtazapine  (REMERON ) 15 MG tablet Take 15 mg by mouth at bedtime as needed (for sleep). 08/10/21  Yes [provider]  nitroGLYCERIN  (NITROSTAT )  0.4 MG SL tablet Place 1 tablet (0.4 mg total) under the tongue every 5 (five) minutes as needed for chest pain. 12/29/19 12/16/23 Yes Patwardhan, Newman PARAS, MD  pantoprazole  (PROTONIX ) 40 MG tablet Take 1 tablet (40 mg total) by mouth daily before breakfast. 08/05/23  Yes Ezzard Sonny RAMAN, PA-C  polyethylene glycol-electrolytes (NULYTELY ) 420 g solution Take 4,000 mLs by mouth once. 12/09/23  Yes [provider]  predniSONE  (DELTASONE ) 5 MG tablet Take 5 mg by mouth daily with breakfast. 11/18/23  Yes [provider]  promethazine  (PHENERGAN ) 25 MG tablet Take 12.5 mg by mouth every 6 (six) hours as needed for nausea or vomiting. 10/21/23  Yes [provider]  thiamine  (VITAMIN B1) 100 MG tablet Take 100 mg by mouth daily. 04/26/23  Yes [provider]  tiZANidine (ZANAFLEX) 2 MG tablet Take 2 mg by mouth at bedtime. 10/17/23  Yes [provider]  clopidogrel  (PLAVIX ) 75 MG tablet Take 75 mg by mouth daily. Patient not taking: Reported on 12/16/2023 12/12/23   [provider]  hydrocortisone  (ANUSOL -HC) 2.5 % rectal cream Apply 1 Application topically 2 (two) times daily. Patient not taking: Reported on 12/16/2023 11/18/23   [provider]    Current Facility-Administered Medications  Medication Dose Route Frequency Provider Last Rate Last Admin   0.9 %  sodium chloride  infusion   Intravenous Continuous Opyd, Timothy S, MD 75 mL/hr at 12/16/23 2347 New Bag at 12/16/23 2347   acetaminophen  (TYLENOL ) tablet 650 mg  650 mg Oral Q6H PRN Opyd, Timothy S, MD   650 mg at 12/17/23 9195   Or   acetaminophen  (TYLENOL ) suppository 650 mg  650 mg Rectal Q6H PRN Opyd, Timothy S, MD       DULoxetine  (CYMBALTA ) DR capsule 30 mg  30 mg Oral BID Opyd, Timothy S, MD   30 mg at 12/17/23 0804   gabapentin  (NEURONTIN ) capsule 100 mg  100 mg Oral TID Opyd, Timothy S, MD   100 mg at 12/17/23 9191   HYDROmorphone  (DILAUDID ) injection 0.5 mg  0.5 mg Intravenous Q3H PRN  Opyd, Timothy S, MD   0.5 mg at 12/17/23 9364   hydroxychloroquine  (PLAQUENIL ) tablet 300 mg  300 mg Oral Daily Opyd, Timothy S, MD   300 mg at 12/17/23 0804   [START ON 12/18/2023] influenza vac split trivalent PF (FLUZONE) injection 0.5 mL  0.5 mL Intramuscular Tomorrow-1000 Opyd, Evalene RAMAN, MD       mirtazapine  (REMERON ) tablet 15 mg  15 mg Oral QHS PRN Opyd, Timothy S, MD   15 mg at 12/17/23 0011   Oral care mouth rinse  15 mL Mouth Rinse PRN Opyd, Evalene RAMAN, MD       oxyCODONE  (Oxy IR/ROXICODONE ) immediate release tablet 5 mg  5 mg Oral Q4H PRN Opyd, Timothy S, MD   5 mg at 12/17/23 0804   pantoprazole  (PROTONIX ) EC tablet 40 mg  40 mg Oral QAC breakfast Opyd, Timothy S, MD   40 mg at 12/17/23 0804   potassium chloride   SA (KLOR-CON  M) CR tablet 20 mEq  20 mEq Oral Once Opyd, Evalene RAMAN, MD       promethazine  (PHENERGAN ) 12.5 mg in sodium chloride  0.9 % 50 mL IVPB  12.5 mg Intravenous Q6H PRN Hinnant, Collin F, PA-C 150 mL/hr at 12/17/23 0723 12.5 mg at 12/17/23 9276   sodium chloride  flush (NS) 0.9 % injection 3 mL  3 mL Intravenous Q12H Opyd, Evalene RAMAN, MD   3 mL at 12/17/23 0010    Allergies as of 12/16/2023 - Review Complete 12/16/2023  Allergen Reaction Noted   Metoclopramide  Anaphylaxis and Other (See Comments) 08/08/2016   Codeine Hives 05/19/2019    Family History  Problem Relation Age of Onset   Drug abuse Mother    Lung cancer Father    Cancer Father        mets   Drug abuse Sister    Drug abuse Brother    Breast cancer Maternal Aunt    Bipolar disorder Maternal Aunt    Drug abuse Maternal Aunt    Colon cancer Maternal Grandmother        late 14s, early 17s   Bipolar disorder Paternal Grandfather    Brain cancer Son    Schizophrenia Son    Cancer Son        brain   Bipolar disorder Cousin    Liver disease Neg Hx     Social History   Socioeconomic History   Marital status: Divorced    Spouse name: Not on file   Number of children: 4   Years of education: 10    Highest education level: Not on file  Occupational History   Not on file  Tobacco Use   Smoking status: Every Day    Current packs/day: 0.25    Average packs/day: 0.3 packs/day for 35.0 years (8.8 ttl pk-yrs)    Types: Cigarettes    Passive exposure: Past   Smokeless tobacco: Never  Vaping Use   Vaping status: Never Used  Substance and Sexual Activity   Alcohol use: Not Currently    Comment: quit in 01/2023, previously heavy   Drug use: Not Currently    Types: Cocaine    Comment: no cocaine in4  years per patient (08/20/21)   Sexual activity: Not Currently    Birth control/protection: Surgical  Other Topics Concern   Not on file  Social History Narrative   Caffeine- tea occass   Social Drivers of Health   Financial Resource Strain: Low Risk  (09/11/2023)   Received from [facility]   Overall Financial Resource Strain (CARDIA)    Difficulty of Paying Living Expenses: Not hard at all  Food Insecurity: No Food Insecurity (12/17/2023)   Hunger Vital Sign    Worried About Running Out of Food in the Last Year: Never true    Ran Out of Food in the Last Year: Never true  Transportation Needs: No Transportation Needs (12/17/2023)   PRAPARE - Administrator, Civil Service (Medical): No    Lack of Transportation (Non-Medical): No  Physical Activity: Unknown (09/11/2023)   Received from [facility]   Exercise Vital Sign    On average, how many days per week do you engage in moderate to strenuous exercise (like a brisk walk)?: 0 days    Minutes of Exercise per Session: Not on file  Stress: No Stress Concern Present (09/11/2023)   Received from [facility] - Occupational Stress Questionnaire  Feeling of Stress : Only a little  Social Connections: Socially Integrated (09/11/2023)   Received from [facility]   Social Network    How would you rate your social network (family, work, friends)?: Good participation with social  networks  Intimate Partner Violence: Not At Risk (12/17/2023)   Humiliation, Afraid, Rape, and Kick questionnaire    Fear of Current or Ex-Partner: No    Emotionally Abused: No    Physically Abused: No    Sexually Abused: No     Review of Systems   Gen: Denies any fever, chills, loss of appetite, change in weight or weight loss CV: Denies chest pain, heart palpitations, syncope, edema  Resp: Denies shortness of breath with rest, cough, wheezing, coughing up blood, and pleurisy. GI: +rectal bleeding +abdominal pain  GU : Denies urinary burning, blood in urine, urinary frequency, and urinary incontinence. MS: Denies joint pain, limitation of movement, swelling, cramps, and atrophy.  Derm: Denies rash, itching, dry skin, hives. Psych: Denies depression, anxiety, memory loss, hallucinations, and confusion. Heme: Denies bruising or bleeding Neuro:  Denies any headaches, dizziness, paresthesias, shaking  Physical Exam   Vital Signs in last 24 hours: Temp:  [97.9 F (36.6 C)-98.4 F (36.9 C)] 98.4 F (36.9 C) (10/01 0352) Pulse Rate:  [60-82] 60 (10/01 0352) Resp:  [17-18] 18 (10/01 0352) BP: (91-142)/(74-97) 91/74 (10/01 0352) SpO2:  [93 %-100 %] 95 % (10/01 0352) Weight:  [75.8 kg] 75.8 kg (09/30 1710) Last BM Date : 12/16/23  General:   Alert,  Well-developed, well-nourished, pleasant and cooperative in NAD Head:  Normocephalic and atraumatic. Eyes:  Sclera clear, no icterus.   Conjunctiva pink. Ears:  Normal auditory acuity. Mouth:  No deformity or lesions, dentition normal. Neck:  Supple; no masses Lungs:  Clear throughout to auscultation.   No wheezes, crackles, or rhonchi. No acute distress. Heart:  Regular rate and rhythm; no murmurs, clicks, rubs,  or gallops. Abdomen:  Soft, and nondistended. TTP of diffuse lower abdomen. No masses, hepatosplenomegaly or hernias noted. Normal bowel sounds, without guarding, and without rebound.   Rectal: tech present as witness,  external evaluation only, no DRE performed. No overt bleeding noted on visual exam, some old dried blood noted around anal opening.   Msk:  Symmetrical without gross deformities. Normal posture. Extremities:  Without clubbing or edema. Neurologic:  Alert and  oriented x4. Skin:  Intact without significant lesions or rashes. Psych:  Alert and cooperative. Normal mood and affect.   Labs/Studies   Recent Labs Recent Labs    12/16/23 1802 12/16/23 2232 12/17/23 0023 12/17/23 0656  WBC 7.7  --  9.5 8.4  HGB 13.5 13.3 13.7 12.2  HCT 38.4 39.0 41.0 34.6*  PLT 391  --  360 371   BMET Recent Labs    12/16/23 1802 12/16/23 2232 12/17/23 0023  NA 141 141 138  K 3.3* 3.7 3.9  CL 107 108 109  CO2 21*  --  12*  GLUCOSE 102* 117* 136*  BUN 7 5* 7  CREATININE 0.56 0.50 0.53  CALCIUM  9.3  --  9.6   LFT Recent Labs    12/16/23 1802  PROT 6.8  ALBUMIN 4.0  AST 16  ALT 14  ALKPHOS 107  BILITOT 0.4   Radiology/Studies CT ABDOMEN PELVIS W CONTRAST Result Date: 12/16/2023 CLINICAL DATA:  Rectal bleeding post colonoscopy EXAM: CT ABDOMEN AND PELVIS WITH CONTRAST TECHNIQUE: Multidetector CT imaging of the abdomen and pelvis was performed using the standard protocol following  bolus administration of intravenous contrast. RADIATION DOSE REDUCTION: This exam was performed according to the departmental dose-optimization program which includes automated exposure control, adjustment of the mA and/or kV according to patient size and/or use of iterative reconstruction technique. CONTRAST:  OMNIPAQUE  IOHEXOL  300 MG/ML  SOLN COMPARISON:  CT 03/26/2023, 02/21/2023 FINDINGS: Lower chest: Lung bases are clear Hepatobiliary: Cholecystectomy. Mild intra and extrahepatic biliary dilatation as before, common bile duct measures up to 14 mm. Pancreas: Unremarkable. No pancreatic ductal dilatation or surrounding inflammatory changes. Spleen: Normal in size without focal abnormality. Adrenals/Urinary  Tract: Multiple surgical clips in the right suprarenal region. Right adrenal gland is surgically absent. Stable 2.1 cm left adrenal mass. Kidneys show no hydronephrosis. The bladder is unremarkable Stomach/Bowel: Postsurgical changes of the stomach and small bowel. No evidence for acute bowel wall thickening or bowel obstruction. Globular contrast within the lumen of the distal sigmoid colon, series 2, image 70, appears to augments on delayed image, series 6, image 70 and would be consistent with active bleeding. Vascular/Lymphatic: Aortic atherosclerosis. No enlarged abdominal or pelvic lymph nodes. Reproductive: Status post hysterectomy. No adnexal masses. Other: Negative for pelvic effusion or free air Musculoskeletal: No acute or suspicious osseous lesion. Postsurgical scarring of the ventral abdominal wall IMPRESSION: 1. Positive for active extravasation/bleeding within the lumen of the distal sigmoid colon. 2. Postsurgical changes of the stomach and small bowel. No evidence for bowel obstruction or bowel wall thickening. 3. Stable 2.1 cm left adrenal mass/adenoma. 4. Aortic atherosclerosis. Critical Value/emergent results were called by telephone at the time of interpretation on 12/16/2023 at 9:51 pm to provider COLLIN HINNANT , who verbally acknowledged these results. Aortic Atherosclerosis (ICD10-I70.0). Electronically Signed   By: Luke Bun M.D.   On: 12/16/2023 21:51    Assessment   Angel Price is a 58 y.o. year old female who presented to the ED with rectal bleeding, abdominal pain s/p colonoscopy earlier in the day, CT A/P performed which showed active bleed in sigmoid colon, GI consulted for further evaluation  Post polypectomy bleeding: -Colonoscopy yesterday with 12 polyps removed -not currently on any ACs, denies NSAID use  -hgb 13.5. on arrival, 13.7 on repeat last night, 12.2 this morning, though on continuous IVF previously, likely some aspect of hemodilution as other cell lines  are down too -CT showed active bleeding in sigmoid colon, of note she had 3 larger polyps removed from rectum/sigmoid colon -2 episodes of rectal bleeding so far this morning -BP mildly soft this AM, remains in the 90s systolic on repeat  -rectal exam without overt bleeding  Will recheck hemoglobin at 12 today, and repeat rectal exam to evaluate for ongoing bleeding. consider flex sig/left sided colonoscopy later today if drop in hemoglobin/continued rectal bleeding.    Plan / Recommendations   Remain NPO Monitor for ongoing/worsening rectal bleeding Avoid all NSAIDs Repeat H&H at 12 with repeat rectal exam to evaluate for overt bleeding Consider flex sig/left sided colonoscopy this afternoon if continued bleeding and/or drop in hemoglobin/hemodynamic instability     12/17/2023, 10:10 AM Angel Frances L. Rydell Wiegel, MSN, APRN, AGNP-C Adult-Gerontology Nurse Practitioner [facility] at [facility]

## 2023-12-17 NOTE — Plan of Care (Signed)
   Problem: Education: Goal: Knowledge of General Education information will improve Description Including pain rating scale, medication(s)/side effects and non-pharmacologic comfort measures Outcome: Progressing

## 2023-12-17 NOTE — Anesthesia Preprocedure Evaluation (Signed)
 Anesthesia Evaluation  Patient identified by MRN, date of birth, ID band Patient awake    Reviewed: Allergy & Precautions, H&P , NPO status , Patient's Chart, lab work & pertinent test results, reviewed documented beta blocker date and time   Airway Mallampati: II  TM Distance: >3 FB Neck ROM: full    Dental  (+) Dental Advisory Given, Caps, Missing,  One upper front is a cap and the other is missing:   Pulmonary Current Smoker and Patient abstained from smoking.   Pulmonary exam normal breath sounds clear to auscultation       Cardiovascular Exercise Tolerance: Good hypertension, + angina  + CAD  Normal cardiovascular exam Rhythm:regular Rate:Normal  Cath this year showed mild CAD ( non obstructive ) and good LV function   Neuro/Psych  Headaches PSYCHIATRIC DISORDERS Anxiety Depression Bipolar Disorder    Neuromuscular disease CVA    GI/Hepatic Neg liver ROS, hiatal hernia,GERD  ,,  Endo/Other  Hypothyroidism    Renal/GU negative Renal ROS  negative genitourinary   Musculoskeletal  (+) Arthritis , Osteoarthritis,    Abdominal   Peds  Hematology   Anesthesia Other Findings   Reproductive/Obstetrics negative OB ROS                              Anesthesia Physical Anesthesia Plan  ASA: 3  Anesthesia Plan: General   Post-op Pain Management: Minimal or no pain anticipated   Induction:   PONV Risk Score and Plan: Propofol  infusion  Airway Management Planned: Natural Airway and Nasal Cannula  Additional Equipment: None  Intra-op Plan:   Post-operative Plan:   Informed Consent: I have reviewed the patients History and Physical, chart, labs and discussed the procedure including the risks, benefits and alternatives for the proposed anesthesia with the patient or authorized representative who has indicated his/her understanding and acceptance.     Dental Advisory Given  Plan  Discussed with: CRNA  Anesthesia Plan Comments:          Anesthesia Quick Evaluation

## 2023-12-17 NOTE — Op Note (Signed)
 [facility] Patient Name: Angel Price Procedure Date: 12/17/2023 2:54 PM MRN: [id number] Date of Birth: January 30, 1966 Attending MD: Lamar Ozell Hollingshead , MD, [id number] CSN: [id number] Age: 58 Admit Type: Inpatient Procedure:                Flexible Sigmoidoscopy with bleeding control                            therapy. Indications:              Hematochezia; positive CTA distal sigmoid; multiple                            cold snare polypectomies yesterday Providers:                Lamar Ozell Hollingshead, MD, Jon LABOR. Gerome RN, RN,                            Leandrew Edelman RN, RN Referring MD:              Medicines:                Propofol  per Anesthesia Complications:            No immediate complications. Estimated Blood Loss:     Estimated blood loss was minimal. Procedure:                Pre-Anesthesia Assessment:                           - Prior to the procedure, a History and Physical                            was performed, and patient medications and                            allergies were reviewed. The patient's tolerance of                            previous anesthesia was also reviewed. The risks                            and benefits of the procedure and the sedation                            options and risks were discussed with the patient.                            All questions were answered, and informed consent                            was obtained. Prior Anticoagulants: The patient has                            taken no anticoagulant or antiplatelet agents. ASA  Grade Assessment: III - A patient with severe                            systemic disease. After reviewing the risks and                            benefits, the patient was deemed in satisfactory                            condition to undergo the procedure.                           After obtaining informed consent, the scope was                            passed  under direct vision. The CF-[id number] ([id number])                            Colon was introduced through the anus and advanced                            to the the splenic flexure. The flexible                            sigmoidoscopy was accomplished without difficulty.                            The patient tolerated the procedure well. The                            quality of the bowel preparation was adequate. Scope In: 3:15:40 PM Scope Out: 3:31:13 PM Total Procedure Duration: 0 hours 15 minutes 33 seconds  Findings:      Bloody effluent with scattered clot in the rectum traveling all the way       up to the splenic flexure where it tapered off. Clot floating in the       effluent in the left colon. In the proximal rectum there was adherent       clot about 2 cm in length. It appeared to be attached to the polypectomy       site. There was diluted fresh blood in the rectum. Proximal to the       splenic flexure, intraluminal clot and blood tapered off. No other       suspicious bleeding lesions found. I sealed the polypectomy site with       (2) 360 clips. Good hemostasis maintained Impression:               - Post polypectomy bleed                           - Blood and clot in left colon and rectum. Large                            area of clot adherent to polypectomy site proximal  rectum.                           - Polypectomy site closed with 360 clips x 2 Moderate Sedation:      Moderate (conscious) sedation was personally administered by an       anesthesia professional. The following parameters were monitored: oxygen        saturation, heart rate, blood pressure, respiratory rate, EKG, adequacy       of pulmonary ventilation, and response to care. Recommendation:           - Return patient to hospital ward for observation.                            Advance diet. Observe her overnight. Recheck                            hemoglobin tomorrow morning.  She will likely pass                            some old blood off and on for her next couple of                            bowel movements.                           - Return to GI clinic to see Dr. Cindie (date not                            yet determined). At request of the patient, I                            called Terrence Poteet at [phone]?"call rolled                            to voicemail. I left a detailed message. Procedure Code(s):        --- Professional ---                           [phone], Sigmoidoscopy, flexible; diagnostic,                            including collection of specimen(s) by brushing or                            washing, when performed (separate procedure) Diagnosis Code(s):        --- Professional ---                           K92.1, Melena (includes Hematochezia) CPT copyright 2022 [employer]. All rights reserved. The codes documented in this report are preliminary and upon coder review may  be revised to meet current compliance requirements. Lamar HERO. Luree Palla, MD Lamar Ozell Hollingshead, MD 12/17/2023 3:46:11 PM This report has been signed electronically. Number of Addenda: 0

## 2023-12-17 NOTE — Transfer of Care (Signed)
 Immediate Anesthesia Transfer of Care Note  Patient: Angel Price  Procedure(s) Performed: KINGSTON SIDE  Patient Location: PACU  Anesthesia Type:General  Level of Consciousness: awake, alert , oriented, and patient cooperative  Airway & Oxygen  Therapy: Patient Spontanous Breathing  Post-op Assessment: Report given to RN, Post -op Vital signs reviewed and stable, and Patient moving all extremities X 4  Post vital signs: Reviewed and stable  Last Vitals:  Vitals Value Taken Time  BP 87/57 12/17/23 15:36  Temp 36.4 C 12/17/23 15:36  Pulse 59 12/17/23 15:37  Resp 13 12/17/23 15:37  SpO2 92 % 12/17/23 15:37  Vitals shown include unfiled device data.  Last Pain:  Vitals:   12/17/23 1512  TempSrc:   PainSc: 10-Worst pain ever      Patients Stated Pain Goal: 7 (12/17/23 1433)  Complications: No notable events documented.

## 2023-12-17 NOTE — Progress Notes (Signed)
 Repeat CBC with hemoglobin down to 11. Patient reports ongoing rectal bleeding x2 episodes since our encounter this am. She has ongoing abdominal pain. BP remains stable but soft, systolic in the 90s. At this time, would recommend pursuing flex sig/left sided colonoscopy for further evaluation/intervention at post polypectomy site as this is likely the source of her bleeding.   Indications, risks and benefits of procedure discussed in detail with patient. Patient verbalized understanding and is in agreement to proceed with flexible sigmoidoscopy/possible left sided colonoscopy.   -remain NPO -tap water  enema x2 -flex sig/possible left sided colonoscopy later this afternoon with Dr. Shaaron  Further recommendations to follow after endoscopic evaluation  Peirce Deveney L. Gursimran Litaker, MSN, APRN, AGNP-C Adult-Gerontology Nurse Practitioner [facility]

## 2023-12-17 NOTE — Progress Notes (Signed)
 PROGRESS NOTE   Angel Price, is a 58 y.o. female, DOB - 12/10/1965, FMW:[id number]  Admit date - 12/16/2023   Admitting Physician Evalene GORMAN Sprinkles, MD  Outpatient Primary MD for the patient is Fanta, Benita Area, MD  LOS - 0  Chief Complaint  Patient presents with   Rectal Bleeding       Brief Narrative:  58 y.o. female with medical history significant for hypertension, mild nonobstructive CAD, rheumatoid arthritis, depression, anxiety, PUD status post hemigastrectomy, and rectal pressure for the past year who underwent colonoscopy and upper endoscopy in am of 12/16/23, admitted later on  12/16/23 due to post polypectomy bleed (presented on 12/16/23 with abdominal pain and rectal bleeding).    -Assessment and Plan: 1)Post polypectomy Bleed-----CT AP with contrast shows-Positive for active extravasation/bleeding within the lumen of the distal sigmoid colon. --Pt underwent flexible sigmoidoscopy on 12/17/2023 by Dr. Shaaron-- sealed the polypectomy site with  (2) 360 clips. Good hemostasis maintained Impression:               - Post polypectomy bleed                           - Blood and clot in left colon and rectum. Large                            area of clot adherent to polypectomy site proximal                            rectum.                           - Polypectomy site closed with 360 clips x 2 -C/n Monitor H&H and transfuse as clinically indicated - If no further concerns about bleeding and H&H stable may discharge home in 24 hours or so  2) acute blood loss anemia--due to #1 above - Manage as above #1  3. CAD  - No anginal symptoms  - Hold antiplatelets for now  --due to GI bleed   4. RA  - Managed with Plaquenil       5. Depression  - Continue Cymbalta , Remeron     6. HTN--2. Hypertension  - Hold antihypertensives given concern for active bleed - Treat as-needed only for now     Status is: Inpatient   Disposition: The patient is from: Home               Anticipated d/c is to: Home              Anticipated d/c date is: 1 day              Patient currently is not medically stable to d/c. Barriers: Not Clinically Stable-   Code Status :  -  Code Status: Full Code   Family Communication:    NA (patient is alert, awake and coherent)   DVT Prophylaxis  :   - SCDs SCDs Start: 12/16/23 2300   Lab Results  Component Value Date   PLT 344 12/17/2023   Inpatient Medications  Scheduled Meds:  DULoxetine   30 mg Oral BID   gabapentin   100 mg Oral TID   hydroxychloroquine   300 mg Oral Daily   [START ON 12/18/2023] influenza vac split trivalent PF  0.5 mL Intramuscular Tomorrow-1000   pantoprazole   40  mg Oral QAC breakfast   potassium chloride   20 mEq Oral Once   sodium chloride  flush  3 mL Intravenous Q12H   Continuous Infusions:  promethazine  (PHENERGAN ) injection (IM or IVPB) 12.5 mg (12/17/23 1727)   PRN Meds:.acetaminophen  **OR** acetaminophen , HYDROmorphone  (DILAUDID ) injection, mirtazapine , mouth rinse, oxyCODONE , promethazine  (PHENERGAN ) injection (IM or IVPB)   Anti-infectives (From admission, onward)    Start     Dose/Rate Route Frequency Ordered Stop   12/17/23 1000  hydroxychloroquine  (PLAQUENIL ) tablet 300 mg        300 mg Oral Daily 12/16/23 2300        Subjective: Angel Price today has no fevers, no emesis,  No chest pain,   - - Tolerated flexible sigmoidoscopy well -   Objective: Vitals:   12/17/23 1536 12/17/23 1545 12/17/23 1548 12/17/23 1600  BP: (!) 87/57 (!) 89/61 102/68 123/86  Pulse: 65 67 65 (!) 55  Resp: 13 17 20 20   Temp: 97.6 F (36.4 C)  97.6 F (36.4 C) (!) 97 F (36.1 C)  TempSrc:      SpO2: 94% 95% 95% 99%  Weight:      Height:        Intake/Output Summary (Last 24 hours) at 12/17/2023 1904 Last data filed at 12/17/2023 1531 Gross per 24 hour  Intake 1037.51 ml  Output --  Net 1037.51 ml   Filed Weights   12/16/23 1710 12/17/23 1433  Weight: 75.8 kg 75.8 kg    Physical  Exam  Gen:- Awake Alert,  in no apparent distress HEENT:- [place].AT, No sclera icterus Neck-Supple Neck,No JVD,.  Lungs-  CTAB , fair symmetrical air movement CV- S1, S2 normal, regular  Abd-  +ve B.Sounds, Abd Soft, No tenderness,    Extremity/Skin:- No  edema, pedal pulses present  Psych-affect is appropriate, oriented x3 Neuro-no new focal deficits, no tremors  Data Reviewed: I have personally reviewed following labs and imaging studies  CBC: Recent Labs  Lab 12/16/23 1802 12/16/23 2232 12/17/23 0023 12/17/23 0656 12/17/23 1222  WBC 7.7  --  9.5 8.4 10.6*  NEUTROABS 4.9  --   --   --   --   HGB 13.5 13.3 13.7 12.2 11.0*  HCT 38.4 39.0 41.0 34.6* 32.5*  MCV 72.5*  --  75.9* 72.7* 72.7*  PLT 391  --  360 371 344   Basic Metabolic Panel: Recent Labs  Lab 12/16/23 1802 12/16/23 2232 12/17/23 0023  NA 141 141 138  K 3.3* 3.7 3.9  CL 107 108 109  CO2 21*  --  12*  GLUCOSE 102* 117* 136*  BUN 7 5* 7  CREATININE 0.56 0.50 0.53  CALCIUM  9.3  --  9.6  MG  --   --  2.4   GFR: Estimated Creatinine Clearance: 76.6 mL/min (by C-G formula based on SCr of 0.53 mg/dL). Liver Function Tests: Recent Labs  Lab 12/16/23 1802  AST 16  ALT 14  ALKPHOS 107  BILITOT 0.4  PROT 6.8  ALBUMIN 4.0   Radiology Studies: CT ABDOMEN PELVIS W CONTRAST Result Date: 12/16/2023 CLINICAL DATA:  Rectal bleeding post colonoscopy EXAM: CT ABDOMEN AND PELVIS WITH CONTRAST TECHNIQUE: Multidetector CT imaging of the abdomen and pelvis was performed using the standard protocol following bolus administration of intravenous contrast. RADIATION DOSE REDUCTION: This exam was performed according to the departmental dose-optimization program which includes automated exposure control, adjustment of the mA and/or kV according to patient size and/or use of iterative reconstruction technique. CONTRAST:  100mL OMNIPAQUE   IOHEXOL  300 MG/ML  SOLN COMPARISON:  CT 03/26/2023, 02/21/2023 FINDINGS: Lower chest: Lung  bases are clear Hepatobiliary: Cholecystectomy. Mild intra and extrahepatic biliary dilatation as before, common bile duct measures up to 14 mm. Pancreas: Unremarkable. No pancreatic ductal dilatation or surrounding inflammatory changes. Spleen: Normal in size without focal abnormality. Adrenals/Urinary Tract: Multiple surgical clips in the right suprarenal region. Right adrenal gland is surgically absent. Stable 2.1 cm left adrenal mass. Kidneys show no hydronephrosis. The bladder is unremarkable Stomach/Bowel: Postsurgical changes of the stomach and small bowel. No evidence for acute bowel wall thickening or bowel obstruction. Globular contrast within the lumen of the distal sigmoid colon, series 2, image 70, appears to augments on delayed image, series 6, image 70 and would be consistent with active bleeding. Vascular/Lymphatic: Aortic atherosclerosis. No enlarged abdominal or pelvic lymph nodes. Reproductive: Status post hysterectomy. No adnexal masses. Other: Negative for pelvic effusion or free air Musculoskeletal: No acute or suspicious osseous lesion. Postsurgical scarring of the ventral abdominal wall IMPRESSION: 1. Positive for active extravasation/bleeding within the lumen of the distal sigmoid colon. 2. Postsurgical changes of the stomach and small bowel. No evidence for bowel obstruction or bowel wall thickening. 3. Stable 2.1 cm left adrenal mass/adenoma. 4. Aortic atherosclerosis. Critical Value/emergent results were called by telephone at the time of interpretation on 12/16/2023 at 9:51 pm to provider COLLIN HINNANT , who verbally acknowledged these results. Aortic Atherosclerosis (ICD10-I70.0). Electronically Signed   By: Luke Bun M.D.   On: 12/16/2023 21:51   Scheduled Meds:  DULoxetine   30 mg Oral BID   gabapentin   100 mg Oral TID   hydroxychloroquine   300 mg Oral Daily   [START ON 12/18/2023] influenza vac split trivalent PF  0.5 mL Intramuscular Tomorrow-1000   pantoprazole   40 mg Oral  QAC breakfast   potassium chloride   20 mEq Oral Once   sodium chloride  flush  3 mL Intravenous Q12H   Continuous Infusions:  promethazine  (PHENERGAN ) injection (IM or IVPB) 12.5 mg (12/17/23 1727)     LOS: 0 days   Rendall Carwin M.D on 12/17/2023 at 7:04 PM  Go to [email or web address] - for contact info  [facility] - Office  [phone]  If 7PM-7AM, please contact night-coverage [email or web address] 12/17/2023, 7:04 PM

## 2023-12-17 NOTE — Anesthesia Postprocedure Evaluation (Signed)
 Anesthesia Post Note  Patient: Angel Price  Procedure(s) Performed: KINGST[place] SIDE  Patient location during evaluation: Phase II Anesthesia Type: General Level of consciousness: awake and alert Pain management: pain level controlled Vital Signs Assessment: post-procedure vital signs reviewed and stable Respiratory status: spontaneous breathing, nonlabored ventilation and respiratory function stable Cardiovascular status: stable Anesthetic complications: no   There were no known notable events for this encounter.   Last Vitals:  Vitals:   12/17/23 1545 12/17/23 1548  BP: (!) 89/61 102/68  Pulse: 67 65  Resp: 17 20  Temp:  36.4 C  SpO2: 95% 95%    Last Pain:  Vitals:   12/17/23 1536  TempSrc:   PainSc: 0-No pain                 [name] [name]

## 2023-12-17 NOTE — Consult Note (Signed)
 Gastroenterology Consult   Referring Provider: No ref. provider found Primary Care Physician:  Carlette Benita Area, MD Primary Gastroenterologist:  Dr. Cindie   Patient ID: Angel Price; [id number]; 1965-09-03   Admit date: 12/16/2023  LOS: 0 days   Date of Consultation: 12/17/2023  Reason for Consultation:  rectal bleeding s/p colonoscopy with polypectomy  History of Present Illness   Angel Price is a 58 y.o. year old female with history of anxiety, anemia, alcohol abuse, depression, gastroparesis, GERD, hemorrhoids, hiatal hernia, HTN, lupus, migraines, pancreatitis, stroke, thyroid  disease who presented to the ED yesterday with c/o abdominal pain and rectal bleeding after colonoscopy earlier in the day. She reported 10 bloody BMs. GI consulted for further evaluation  ED Course: Heme positive stool Hgb 13.5 on arrival  CT A/P with active bleed of the sigmoid colon  BP 111/90 last night, last reading 91/74   Consult: Hgb 13.7 last night, 12.2 this morning (likely hemodilution in setting of ongoing IVFs)  States she had a small amount of rectal bleeding after procedure but went home and as the day went on she continued to have multiple BMs with more BRB noted in the toilet. She notes stools were solid. She was seeing some blood dripping out into the toilet. She had some abdominal pain after the procedure which persisted and worsened over the course of the day. She has some nausea but no vomiting. Feels pain is about the same as it was yesterday. She notes continued rectal bleeding this morning x2. She has attempted to have 2 BMs but notes no real presence of stool. She notes some dizziness with exertion. No blood thinners or NSAIDs.    Colonoscopy 12/16/23 - Non-bleeding internal hemorrhoids.                           - Diverticulosis in the entire examined colon.                           - Four 4 to 7 mm polyps in the ascending colon and                            in  the cecum, removed with a cold snare. Resected                            and retrieved.                           - Five 4 to 8 mm polyps in the transverse colon,                            removed with a cold snare. Resected and retrieved.                           - Three 5 to 10 mm polyps in the rectum and in the                            sigmoid colon, removed with a cold snare. Resected  and retrieved  (Recommended repeat TCS 1 year)  Past Medical History:  Diagnosis Date   Abscess    soft tissue   Adrenal mass    Alcohol abuse    Anemia    Anxiety    Blood transfusion without reported diagnosis    Chronic abdominal pain    Chronic wound infection of abdomen    Colon polyp    colonoscopy 04/2014   Depression    Discoid lupus erythematosus    Diverticulosis    colonoscopy 04/2014 moderat pan colonic   Drug-seeking behavior    Gastritis    EGD 05/2014   Gastroparesis 01/2014   GERD (gastroesophageal reflux disease)    Hemorrhoid    internal large   Hiatal hernia    History of Billroth II operation    Hypertension    Lung nodule    CT 02/2014 needs repeat 1 month   Lung nodule < 6cm on CT 04/25/2014   Lupus    Malingering    Migraine    Nausea and vomiting    chronic, recurrent   Pancreatitis    Schatzki's ring    patent per EGD 04/2014   Sickle cell trait    Stroke (HCC)    Suicide attempt (HCC)    Thyroid  disease 2000   overactive, radiation    Past Surgical History:  Procedure Laterality Date   ABDOMINAL HYSTERECTOMY  2013   [place]   ABDOMINAL HYSTERECTOMY     ABDOMINAL SURGERY     ADRENALECTOMY Right    AGILE CAPSULE N/A 01/05/2015   Procedure: AGILE CAPSULE;  Surgeon: Lamar CHRISTELLA Hollingshead, MD;  [facility];  Service: Endoscopy;  Laterality: N/A;  0700   BALLOON DILATION N/A 10/01/2021   Procedure: BALLOON DILATION;  Surgeon: Cindie Carlin POUR, DO;  [facility]: [facility];  Service: Endoscopy;  Laterality: N/A;    Billroth II procedure      [place], first 2000, 2005/2006.   bilroth 2     BIOPSY  05/20/2013   Procedure: BIOPSIES OF ASCENDING AND SIGMOID COLON;  Surgeon: Lamar CHRISTELLA Hollingshead, MD;  Location: [facility];  Service: Endoscopy;;   BIOPSY  04/26/2014   Procedure: BIOPSIES;  Surgeon: Margo LITTIE Haddock, MD;  Location: [facility];  Service: Endoscopy;;   BIOPSY  12/24/2018   Procedure: BIOPSY;  Surgeon: Hollingshead Lamar CHRISTELLA, MD;  [facility];  Service: Endoscopy;;  colon   CHOLECYSTECTOMY     COLONOSCOPY     in [place]   COLONOSCOPY WITH PROPOFOL  N/A 05/20/2013   Dr.Rourk- inadequate prep, normal appearing rectum, grossly normal colon aside from pancolonic diverticula, normal terminal ileum bx= unremarkable colonic mucosa. Due for early interval 2016.    COLONOSCOPY WITH PROPOFOL  N/A 04/26/2014   DOQ:wnmfjo ileum/one colon polyp removed/moderate pan-colonic diverticulosis/large internal hemorrhoids   COLONOSCOPY WITH PROPOFOL  N/A 12/23/2014   Dr.Rourk- minimal internal hemorrhoids, pancolonic diverticulosis   COLONOSCOPY WITH PROPOFOL  N/A 08/23/2016   Procedure: COLONOSCOPY WITH PROPOFOL ;  Surgeon: Haddock Margo LITTIE, MD;  [facility];  Service: Endoscopy;  Laterality: N/A;   COLONOSCOPY WITH PROPOFOL  N/A 12/24/2018   Pancolonic diverticulosis, normal TI, one 5 mm polyp in rectum (tubular adenoma). Somewhat friable hemorrhagic mucosa in area of IC valve/cecum, possibly related to scope trauma s/p biopsy. Repeat in 7 years.   DEBRIDEMENT OF ABDOMINAL WALL ABSCESS N/A 02/08/2013   Procedure: DEBRIDEMENT OF ABDOMINAL WALL ABSCESS;  Surgeon: Oneil DELENA Budge, MD;  Location: [facility];  Service: General;  Laterality: N/A;   ESOPHAGOGASTRODUODENOSCOPY (EGD) WITH PROPOFOL  N/A 05/20/2013   Dr.Rourk- s/p prior gastric surgery with normal esophagus, residual gastric mucosa and patent efferent limb   ESOPHAGOGASTRODUODENOSCOPY (EGD) WITH PROPOFOL  N/A 02/03/2014   Dr. Shaaron:  s/p hemigastrectomy with retained  gastric contents. Residual gastric mucosa and efferent limb appeared normal otherwise. Query gastroparesis.    ESOPHAGOGASTRODUODENOSCOPY (EGD) WITH PROPOFOL  N/A 04/26/2014   DOQ:dryjusxp'd ring/small HH/mild non-erosive gasrtitis/normal anastomosis   ESOPHAGOGASTRODUODENOSCOPY (EGD) WITH PROPOFOL  N/A 12/23/2014   Dr.Rourk- s/p prior hemigastrctomy, active oozing from anastomotic suture site, hemostasis achieved   ESOPHAGOGASTRODUODENOSCOPY (EGD) WITH PROPOFOL  N/A 05/23/2016   Dr. 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    ESOPHAGOGASTRODUODENOSCOPY (EGD) WITH PROPOFOL  N/A 05/11/2017   Procedure: ESOPHAGOGASTRODUODENOSCOPY (EGD) WITH PROPOFOL ;  Surgeon: Shaaron Lamar HERO, MD;  [facility];  Service: Endoscopy;  Laterality: N/A;   ESOPHAGOGASTRODUODENOSCOPY (EGD) WITH PROPOFOL  N/A 06/07/2021   Procedure: ESOPHAGOGASTRODUODENOSCOPY (EGD) WITH PROPOFOL ;  Surgeon: Shaaron Lamar HERO, MD;  [facility];  Service: Endoscopy;  Laterality: N/A;  11:45am   ESOPHAGOGASTRODUODENOSCOPY (EGD) WITH PROPOFOL  N/A 10/01/2021   Procedure: ESOPHAGOGASTRODUODENOSCOPY (EGD) WITH PROPOFOL ;  Surgeon: Cindie Carlin POUR, DO;  [facility];  Service: Endoscopy;  Laterality: N/A;  3:00pm   EXCISION OF MESH  08/2019   HEMORRHOID SURGERY N/A 06/18/2017   Procedure: THREE COLUMN EXTENSIVE HEMORRHOIDECTOMY;  Surgeon: Kallie Manuelita BROCKS, MD;  Location: [facility];  Service: General;  Laterality: N/A;   HERNIA REPAIR     INCISION AND DRAINAGE ABSCESS N/A 01/06/2017   Procedure: INCISION AND DRAINAGE ABDOMINAL WALL ABSCESS;  Surgeon: Mavis Anes, MD;  Location: [facility];  Service: General;  Laterality: N/A;   incisional hernial repair  09/05/2021   [place]   LEFT HEART CATH AND CORONARY ANGIOGRAPHY N/A 05/09/2020   Procedure: LEFT HEART CATH AND CORONARY ANGIOGRAPHY;  Surgeon: Elmira Newman PARAS, MD;   [facility];  Service: Cardiovascular;  Laterality: N/A;   LEFT HEART CATH AND CORONARY ANGIOGRAPHY N/A 08/22/2023   Procedure: LEFT HEART CATH AND CORONARY ANGIOGRAPHY;  Surgeon: Verlin Lonni BIRCH, MD;  Location: [facility];  Service: Cardiovascular;  Laterality: N/A;   POLYPECTOMY  12/24/2018   Procedure: POLYPECTOMY;  Surgeon: Shaaron Lamar HERO, MD;  [facility];  Service: Endoscopy;;  colon   tendon repar Right    wrist   WOUND EXPLORATION Right 06/24/2013   Procedure: exploration of traumatic wound right wrist;  Surgeon: Franky JONELLE Curia, MD;  Location: [facility];  Service: Orthopedics;  Laterality: Right;    Prior to Admission medications   Medication Sig Start Date End Date Taking? Authorizing Provider  amLODipine  (NORVASC ) 10 MG tablet Take 10 mg by mouth daily. 11/18/23  Yes [provider]  aspirin  EC 81 MG EC tablet Take 1 tablet (81 mg total) by mouth daily. 05/19/19  Yes Elgergawy, Brayton RAMAN, MD  atenolol  (TENORMIN ) 50 MG tablet Take 50 mg by mouth daily. 10/17/23  Yes [provider]  Cholecalciferol  50 MCG (2000 UT) CAPS Take 1 tablet by mouth daily.   Yes [provider]  Cyanocobalamin  (B-12) 1000 MCG SUBL Place 1,000 mcg under the tongue daily. 11/26/23  Yes Ezzard Sonny RAMAN, PA-C  DULoxetine  (CYMBALTA ) 30 MG capsule Take 30 mg by mouth 2 (two) times daily. 08/14/21  Yes [provider]  gabapentin  (NEURONTIN ) 100 MG capsule Take 100 mg by mouth  3 (three) times daily. 03/03/23  Yes [provider]  HYDROcodone -acetaminophen  (NORCO/VICODIN) 5-325 MG tablet Take 1 tablet by mouth 2 (two) times daily.   Yes [provider]  hydroxychloroquine  (PLAQUENIL ) 200 MG tablet Take 1.5 tablets (300 mg total) by mouth daily. 11/03/23  Yes Rice, Lonni ORN, MD  mirtazapine  (REMERON ) 15 MG tablet Take 15 mg by mouth at bedtime as needed (for sleep). 08/10/21  Yes [provider]  nitroGLYCERIN  (NITROSTAT )  0.4 MG SL tablet Place 1 tablet (0.4 mg total) under the tongue every 5 (five) minutes as needed for chest pain. 12/29/19 12/16/23 Yes Patwardhan, Newman PARAS, MD  pantoprazole  (PROTONIX ) 40 MG tablet Take 1 tablet (40 mg total) by mouth daily before breakfast. 08/05/23  Yes Ezzard Sonny RAMAN, PA-C  polyethylene glycol-electrolytes (NULYTELY ) 420 g solution Take 4,000 mLs by mouth once. 12/09/23  Yes [provider]  predniSONE  (DELTASONE ) 5 MG tablet Take 5 mg by mouth daily with breakfast. 11/18/23  Yes [provider]  promethazine  (PHENERGAN ) 25 MG tablet Take 12.5 mg by mouth every 6 (six) hours as needed for nausea or vomiting. 10/21/23  Yes [provider]  thiamine  (VITAMIN B1) 100 MG tablet Take 100 mg by mouth daily. 04/26/23  Yes [provider]  tiZANidine (ZANAFLEX) 2 MG tablet Take 2 mg by mouth at bedtime. 10/17/23  Yes [provider]  clopidogrel  (PLAVIX ) 75 MG tablet Take 75 mg by mouth daily. Patient not taking: Reported on 12/16/2023 12/12/23   [provider]  hydrocortisone  (ANUSOL -HC) 2.5 % rectal cream Apply 1 Application topically 2 (two) times daily. Patient not taking: Reported on 12/16/2023 11/18/23   [provider]    Current Facility-Administered Medications  Medication Dose Route Frequency Provider Last Rate Last Admin   0.9 %  sodium chloride  infusion   Intravenous Continuous Opyd, Timothy S, MD 75 mL/hr at 12/16/23 2347 New Bag at 12/16/23 2347   acetaminophen  (TYLENOL ) tablet 650 mg  650 mg Oral Q6H PRN Opyd, Timothy S, MD   650 mg at 12/17/23 9195   Or   acetaminophen  (TYLENOL ) suppository 650 mg  650 mg Rectal Q6H PRN Opyd, Timothy S, MD       DULoxetine  (CYMBALTA ) DR capsule 30 mg  30 mg Oral BID Opyd, Timothy S, MD   30 mg at 12/17/23 9195   gabapentin  (NEURONTIN ) capsule 100 mg  100 mg Oral TID Opyd, Timothy S, MD   100 mg at 12/17/23 9191   HYDROmorphone  (DILAUDID ) injection 0.5 mg  0.5 mg Intravenous Q3H PRN  Opyd, Timothy S, MD   0.5 mg at 12/17/23 9364   hydroxychloroquine  (PLAQUENIL ) tablet 300 mg  300 mg Oral Daily Opyd, Timothy S, MD   300 mg at 12/17/23 0804   [START ON 12/18/2023] influenza vac split trivalent PF (FLUZONE) injection 0.5 mL  0.5 mL Intramuscular Tomorrow-1000 Opyd, Evalene RAMAN, MD       mirtazapine  (REMERON ) tablet 15 mg  15 mg Oral QHS PRN Opyd, Timothy S, MD   15 mg at 12/17/23 0011   Oral care mouth rinse  15 mL Mouth Rinse PRN Opyd, Evalene RAMAN, MD       oxyCODONE  (Oxy IR/ROXICODONE ) immediate release tablet 5 mg  5 mg Oral Q4H PRN Opyd, Timothy S, MD   5 mg at 12/17/23 0804   pantoprazole  (PROTONIX ) EC tablet 40 mg  40 mg Oral QAC breakfast Opyd, Timothy S, MD   40 mg at 12/17/23 0804   potassium chloride   SA (KLOR-CON  M) CR tablet 20 mEq  20 mEq Oral Once Opyd, Evalene RAMAN, MD       promethazine  (PHENERGAN ) 12.5 mg in sodium chloride  0.9 % 50 mL IVPB  12.5 mg Intravenous Q6H PRN Hinnant, Collin F, PA-C 150 mL/hr at 12/17/23 0723 12.5 mg at 12/17/23 9276   sodium chloride  flush (NS) 0.9 % injection 3 mL  3 mL Intravenous Q12H Opyd, Evalene RAMAN, MD   3 mL at 12/17/23 0010    Allergies as of 12/16/2023 - Review Complete 12/16/2023  Allergen Reaction Noted   Metoclopramide  Anaphylaxis and Other (See Comments) 08/08/2016   Codeine Hives 05/19/2019    Family History  Problem Relation Age of Onset   Drug abuse Mother    Lung cancer Father    Cancer Father        mets   Drug abuse Sister    Drug abuse Brother    Breast cancer Maternal Aunt    Bipolar disorder Maternal Aunt    Drug abuse Maternal Aunt    Colon cancer Maternal Grandmother        late 7s, early 65s   Bipolar disorder Paternal Grandfather    Brain cancer Son    Schizophrenia Son    Cancer Son        brain   Bipolar disorder Cousin    Liver disease Neg Hx     Social History   Socioeconomic History   Marital status: Divorced    Spouse name: Not on file   Number of children: 4   Years of education: 10    Highest education level: Not on file  Occupational History   Not on file  Tobacco Use   Smoking status: Every Day    Current packs/day: 0.25    Average packs/day: 0.3 packs/day for 35.0 years (8.8 ttl pk-yrs)    Types: Cigarettes    Passive exposure: Past   Smokeless tobacco: Never  Vaping Use   Vaping status: Never Used  Substance and Sexual Activity   Alcohol use: Not Currently    Comment: quit in 01/2023, previously heavy   Drug use: Not Currently    Types: Cocaine    Comment: no cocaine in4  years per patient (08/20/21)   Sexual activity: Not Currently    Birth control/protection: Surgical  Other Topics Concern   Not on file  Social History Narrative   Caffeine- tea occass   Social Drivers of Health   Financial Resource Strain: Low Risk  (09/11/2023)   Received from [facility]   Overall Financial Resource Strain (CARDIA)    Difficulty of Paying Living Expenses: Not hard at all  Food Insecurity: No Food Insecurity (12/17/2023)   Hunger Vital Sign    Worried About Running Out of Food in the Last Year: Never true    Ran Out of Food in the Last Year: Never true  Transportation Needs: No Transportation Needs (12/17/2023)   PRAPARE - Administrator, Civil Service (Medical): No    Lack of Transportation (Non-Medical): No  Physical Activity: Unknown (09/11/2023)   Received from [facility]   Exercise Vital Sign    On average, how many days per week do you engage in moderate to strenuous exercise (like a brisk walk)?: 0 days    Minutes of Exercise per Session: Not on file  Stress: No Stress Concern Present (09/11/2023)   Received from [facility] - Occupational Stress Questionnaire  Feeling of Stress : Only a little  Social Connections: Socially Integrated (09/11/2023)   Received from [facility]   Social Network    How would you rate your social network (family, work, friends)?: Good participation with social  networks  Intimate Partner Violence: Not At Risk (12/17/2023)   Humiliation, Afraid, Rape, and Kick questionnaire    Fear of Current or Ex-Partner: No    Emotionally Abused: No    Physically Abused: No    Sexually Abused: No     Review of Systems   Gen: Denies any fever, chills, loss of appetite, change in weight or weight loss CV: Denies chest pain, heart palpitations, syncope, edema  Resp: Denies shortness of breath with rest, cough, wheezing, coughing up blood, and pleurisy. GI: +rectal bleeding +abdominal pain  GU : Denies urinary burning, blood in urine, urinary frequency, and urinary incontinence. MS: Denies joint pain, limitation of movement, swelling, cramps, and atrophy.  Derm: Denies rash, itching, dry skin, hives. Psych: Denies depression, anxiety, memory loss, hallucinations, and confusion. Heme: Denies bruising or bleeding Neuro:  Denies any headaches, dizziness, paresthesias, shaking  Physical Exam   Vital Signs in last 24 hours: Temp:  [97.9 F (36.6 C)-98.4 F (36.9 C)] 98.4 F (36.9 C) (10/01 0352) Pulse Rate:  [60-82] 60 (10/01 0352) Resp:  [15-18] 18 (10/01 0352) BP: (91-142)/(56-97) 91/74 (10/01 0352) SpO2:  [93 %-100 %] 95 % (10/01 0352) Weight:  [75.8 kg] 75.8 kg (09/30 1710) Last BM Date : 12/16/23  General:   Alert,  Well-developed, well-nourished, pleasant and cooperative in NAD Head:  Normocephalic and atraumatic. Eyes:  Sclera clear, no icterus.   Conjunctiva pink. Ears:  Normal auditory acuity. Mouth:  No deformity or lesions, dentition normal. Neck:  Supple; no masses Lungs:  Clear throughout to auscultation.   No wheezes, crackles, or rhonchi. No acute distress. Heart:  Regular rate and rhythm; no murmurs, clicks, rubs,  or gallops. Abdomen:  Soft, and nondistended. TTP of diffuse lower abdomen. No masses, hepatosplenomegaly or hernias noted. Normal bowel sounds, without guarding, and without rebound.   Rectal: tech present as witness,  external evaluation only, no DRE performed. No overt bleeding noted on visual exam, some old dried blood noted around anal opening.   Msk:  Symmetrical without gross deformities. Normal posture. Extremities:  Without clubbing or edema. Neurologic:  Alert and  oriented x4. Skin:  Intact without significant lesions or rashes. Psych:  Alert and cooperative. Normal mood and affect.   Labs/Studies   Recent Labs Recent Labs    12/16/23 1802 12/16/23 2232 12/17/23 0023 12/17/23 0656  WBC 7.7  --  9.5 8.4  HGB 13.5 13.3 13.7 12.2  HCT 38.4 39.0 41.0 34.6*  PLT 391  --  360 371   BMET Recent Labs    12/16/23 1802 12/16/23 2232 12/17/23 0023  NA 141 141 138  K 3.3* 3.7 3.9  CL 107 108 109  CO2 21*  --  12*  GLUCOSE 102* 117* 136*  BUN 7 5* 7  CREATININE 0.56 0.50 0.53  CALCIUM  9.3  --  9.6   LFT Recent Labs    12/16/23 1802  PROT 6.8  ALBUMIN 4.0  AST 16  ALT 14  ALKPHOS 107  BILITOT 0.4   Radiology/Studies CT ABDOMEN PELVIS W CONTRAST Result Date: 12/16/2023 CLINICAL DATA:  Rectal bleeding post colonoscopy EXAM: CT ABDOMEN AND PELVIS WITH CONTRAST TECHNIQUE: Multidetector CT imaging of the abdomen and pelvis was performed using the standard protocol following  bolus administration of intravenous contrast. RADIATION DOSE REDUCTION: This exam was performed according to the departmental dose-optimization program which includes automated exposure control, adjustment of the mA and/or kV according to patient size and/or use of iterative reconstruction technique. CONTRAST:  OMNIPAQUE  IOHEXOL  300 MG/ML  SOLN COMPARISON:  CT 03/26/2023, 02/21/2023 FINDINGS: Lower chest: Lung bases are clear Hepatobiliary: Cholecystectomy. Mild intra and extrahepatic biliary dilatation as before, common bile duct measures up to 14 mm. Pancreas: Unremarkable. No pancreatic ductal dilatation or surrounding inflammatory changes. Spleen: Normal in size without focal abnormality. Adrenals/Urinary  Tract: Multiple surgical clips in the right suprarenal region. Right adrenal gland is surgically absent. Stable 2.1 cm left adrenal mass. Kidneys show no hydronephrosis. The bladder is unremarkable Stomach/Bowel: Postsurgical changes of the stomach and small bowel. No evidence for acute bowel wall thickening or bowel obstruction. Globular contrast within the lumen of the distal sigmoid colon, series 2, image 70, appears to augments on delayed image, series 6, image 70 and would be consistent with active bleeding. Vascular/Lymphatic: Aortic atherosclerosis. No enlarged abdominal or pelvic lymph nodes. Reproductive: Status post hysterectomy. No adnexal masses. Other: Negative for pelvic effusion or free air Musculoskeletal: No acute or suspicious osseous lesion. Postsurgical scarring of the ventral abdominal wall IMPRESSION: 1. Positive for active extravasation/bleeding within the lumen of the distal sigmoid colon. 2. Postsurgical changes of the stomach and small bowel. No evidence for bowel obstruction or bowel wall thickening. 3. Stable 2.1 cm left adrenal mass/adenoma. 4. Aortic atherosclerosis. Critical Value/emergent results were called by telephone at the time of interpretation on 12/16/2023 at 9:51 pm to provider COLLIN HINNANT , who verbally acknowledged these results. Aortic Atherosclerosis (ICD10-I70.0). Electronically Signed   By: Luke Bun M.D.   On: 12/16/2023 21:51    Assessment   Angel Price is a 58 y.o. year old female who presented to the ED with rectal bleeding, abdominal pain s/p colonoscopy earlier in the day, CT A/P performed which showed active bleed in sigmoid colon, GI consulted for further evaluation  Post polypectomy bleeding: -Colonoscopy yesterday with 12 polyps removed -not currently on any ACs, denies NSAID use  -hgb 13.5. on arrival, 13.7 on repeat last night, 12.2 this morning, though on continuous IVF previously, likely some aspect of hemodilution as other cell lines  are down too -CT showed active bleeding in sigmoid colon, of note she had 3 polyps removed from rectum/sigmoid colon -2 episodes of rectal bleeding so far this morning -BP mildly soft this AM -rectal exam without overt bleeding  Will repeat hemoglobin at 12 today, consider flex sig if drop in hemoglobin/continued rectal bleeding.    Plan / Recommendations   Remain NPO Monitor for worsening GI bleeding Avoid all NSAIDs Repeat H&H at 12 Consider flex sig this afternoon if continued bleeding and/or drop in hemoglobin/hemodynamic instability    12/17/2023, 9:04 AM Alandis Bluemel L. Arpita Fentress, MSN, APRN, AGNP-C Adult-Gerontology Nurse Practitioner [facility]

## 2023-12-18 ENCOUNTER — Encounter (HOSPITAL_COMMUNITY): Payer: Self-pay | Admitting: Internal Medicine

## 2023-12-18 ENCOUNTER — Telehealth: Payer: Self-pay | Admitting: Gastroenterology

## 2023-12-18 LAB — CBC
HCT: 32.2 % — ABNORMAL LOW (ref 36.0–46.0)
Hemoglobin: 11 g/dL — ABNORMAL LOW (ref 12.0–15.0)
MCH: 25.2 pg — ABNORMAL LOW (ref 26.0–34.0)
MCHC: 34.2 g/dL (ref 30.0–36.0)
MCV: 73.9 fL — ABNORMAL LOW (ref 80.0–100.0)
Platelets: 302 K/uL (ref 150–400)
RBC: 4.36 MIL/uL (ref 3.87–5.11)
RDW: 17.8 % — ABNORMAL HIGH (ref 11.5–15.5)
WBC: 9.2 K/uL (ref 4.0–10.5)
nRBC: 0 % (ref 0.0–0.2)

## 2023-12-18 LAB — BASIC METABOLIC PANEL WITH GFR
Anion gap: 11 (ref 5–15)
BUN: 10 mg/dL (ref 6–20)
CO2: 19 mmol/L — ABNORMAL LOW (ref 22–32)
Calcium: 8.4 mg/dL — ABNORMAL LOW (ref 8.9–10.3)
Chloride: 108 mmol/L (ref 98–111)
Creatinine, Ser: 0.57 mg/dL (ref 0.44–1.00)
GFR, Estimated: >60 mL/min (ref >60)
Glucose, Bld: 72 mg/dL (ref 70–99)
Potassium: 3.6 mmol/L (ref 3.5–5.1)
Sodium: 139 mmol/L (ref 135–145)

## 2023-12-18 MED ORDER — ATENOLOL 50 MG PO TABS: 50.0000 mg | ORAL_TABLET | Freq: Every day | ORAL | 11 refills | Status: AC

## 2023-12-18 MED ORDER — PANTOPRAZOLE SODIUM 40 MG PO TBEC: 40.0000 mg | DELAYED_RELEASE_TABLET | Freq: Every day | ORAL | 5 refills | Status: AC

## 2023-12-18 MED ORDER — ACETAMINOPHEN 325 MG PO TABS: 650.0000 mg | ORAL_TABLET | Freq: Four times a day (QID) | ORAL | Status: DC | PRN

## 2023-12-18 MED ORDER — PROMETHAZINE HCL 25 MG PO TABS: 12.5000 mg | ORAL_TABLET | Freq: Four times a day (QID) | ORAL | 0 refills | Status: DC | PRN

## 2023-12-18 MED ORDER — ASPIRIN 81 MG PO TBEC: 81.0000 mg | DELAYED_RELEASE_TABLET | Freq: Every day | ORAL | 11 refills | Status: AC

## 2023-12-18 MED ORDER — AMLODIPINE BESYLATE 5 MG PO TABS: 5.0000 mg | ORAL_TABLET | Freq: Every day | ORAL | 11 refills | Status: DC

## 2023-12-18 NOTE — Discharge Summary (Signed)
 Angel Price, is a 58 y.o. female  DOB [birth year]  MRN [id number].  Admission date:  12/16/2023  Admitting Physician  Evalene GORMAN Sprinkles, MD  Discharge Date:  12/18/2023   Primary MD  Carlette Benita Area, MD  Recommendations for primary care physician for things to follow:  1)Avoid ibuprofen /Advil /Aleve /Motrin Josefine Powders/Naproxen /BC powders/Meloxicam/Diclofenac/Indomethacin and other Nonsteroidal anti-inflammatory medications as these will make you more likely to bleed and can cause stomach ulcers, can also cause Kidney problems.   2)Please Repeat CBC and BMP Blood Tests within 1 week with your PCP (Fanta, Benita Area, MD)  3)Follow-up Gastroenterologist Dr. Carlin Hasty with [facility]--- in 1 to 2 weeks for evaluation  -address: [address], [place], [place] [address], Phone: [phone]  4) please note that there has been some adjustments to your medications  Admission Diagnosis  Acute lower GI bleeding [K92.2]   Discharge Diagnosis  Acute lower GI bleeding [K92.2]    Principal Problem:   Acute lower GI bleeding Active Problems:   Essential hypertension, benign   MDD (major depressive disorder), recurrent episode, severe (HCC)   Hypokalemia   Coronary artery disease of native artery of native heart with stable angina pectoris   Seropositive rheumatoid arthritis (HCC)      Past Medical History:  Diagnosis Date   Abscess    soft tissue   Adrenal mass    Alcohol abuse    Anemia    Anxiety    Blood transfusion without reported diagnosis    Chronic abdominal pain    Chronic wound infection of abdomen    Colon polyp    colonoscopy 04/2014   Depression    Discoid lupus erythematosus    Diverticulosis    colonoscopy 04/2014 moderat pan colonic   Drug-seeking behavior    Gastritis    EGD 05/2014   Gastroparesis 01/2014   GERD (gastroesophageal reflux  disease)    Hemorrhoid    internal large   Hiatal hernia    History of Billroth II operation    Hypertension    Lung nodule    CT 02/2014 needs repeat 1 month   Lung nodule < 6cm on CT 04/25/2014   Lupus    Malingering    Migraine    Nausea and vomiting    chronic, recurrent   Pancreatitis    Schatzki's ring    patent per EGD 04/2014   Sickle cell trait    Stroke [facility])    Suicide attempt (HCC)    Thyroid  disease 2000   overactive, radiation    Past Surgical History:  Procedure Laterality Date   ABDOMINAL HYSTERECTOMY  2013   [place]   ABDOMINAL HYSTERECTOMY     ABDOMINAL SURGERY     ADRENALECTOMY Right    AGILE CAPSULE N/A 01/05/2015   Procedure: AGILE CAPSULE;  Surgeon: Lamar CHRISTELLA Hollingshead, MD;  [facility];  Service: Endoscopy;  Laterality: N/A;  0700   BALLOON DILATION N/A 10/01/2021   Procedure: BALLOON DILATION;  Surgeon: Hasty Carlin POUR, DO;  [facility]  [facility];  Service: Endoscopy;  Laterality: N/A;   Billroth II procedure      [place], first 2000, 2005/2006.   bilroth 2     BIOPSY  05/20/2013   Procedure: BIOPSIES OF ASCENDING AND SIGMOID COLON;  Surgeon: Lamar CHRISTELLA Hollingshead, MD;  Location: [facility];  Service: Endoscopy;;   BIOPSY  04/26/2014   Procedure: BIOPSIES;  Surgeon: Margo LITTIE Haddock, MD;  Location: [facility];  Service: Endoscopy;;   BIOPSY  12/24/2018   Procedure: BIOPSY;  Surgeon: Hollingshead Lamar CHRISTELLA, MD;  [facility];  Service: Endoscopy;;  colon   CHOLECYSTECTOMY     COLONOSCOPY     in [place]   COLONOSCOPY N/A 12/16/2023   Procedure: COLONOSCOPY;  Surgeon: Cindie Carlin POUR, DO;  [facility];  Service: Endoscopy;  Laterality: N/A;  145pm, ok rm 1   COLONOSCOPY WITH PROPOFOL  N/A 05/20/2013   Dr.Rourk- inadequate prep, normal appearing rectum, grossly normal colon aside from pancolonic diverticula, normal terminal ileum bx= unremarkable colonic mucosa. Due for early interval 2016.    COLONOSCOPY WITH PROPOFOL  N/A 04/26/2014    DOQ:wnmfjo ileum/one colon polyp removed/moderate pan-colonic diverticulosis/large internal hemorrhoids   COLONOSCOPY WITH PROPOFOL  N/A 12/23/2014   Dr.Rourk- minimal internal hemorrhoids, pancolonic diverticulosis   COLONOSCOPY WITH PROPOFOL  N/A 08/23/2016   Procedure: COLONOSCOPY WITH PROPOFOL ;  Surgeon: Haddock Margo LITTIE, MD;  [facility];  Service: Endoscopy;  Laterality: N/A;   COLONOSCOPY WITH PROPOFOL  N/A 12/24/2018   Pancolonic diverticulosis, normal TI, one 5 mm polyp in rectum (tubular adenoma). Somewhat friable hemorrhagic mucosa in area of IC valve/cecum, possibly related to scope trauma s/p biopsy. Repeat in 7 years.   DEBRIDEMENT OF ABDOMINAL WALL ABSCESS N/A 02/08/2013   Procedure: DEBRIDEMENT OF ABDOMINAL WALL ABSCESS;  Surgeon: Oneil DELENA Budge, MD;  Location: [facility];  Service: General;  Laterality: N/A;   ESOPHAGEAL DILATION N/A 12/16/2023   Procedure: DILATION, ESOPHAGUS;  Surgeon: Cindie Carlin POUR, DO;  [facility];  Service: Endoscopy;  Laterality: N/A;   ESOPHAGOGASTRODUODENOSCOPY N/A 12/16/2023   Procedure: EGD (ESOPHAGOGASTRODUODENOSCOPY);  Surgeon: Cindie Carlin POUR, DO;  [facility];  Service: Endoscopy;  Laterality: N/A;   ESOPHAGOGASTRODUODENOSCOPY (EGD) WITH PROPOFOL  N/A 05/20/2013   Dr.Rourk- s/p prior gastric surgery with normal esophagus, residual gastric mucosa and patent efferent limb   ESOPHAGOGASTRODUODENOSCOPY (EGD) WITH PROPOFOL  N/A 02/03/2014   Dr. Hollingshead:  s/p hemigastrectomy with retained gastric contents. Residual gastric mucosa and efferent limb appeared normal otherwise. Query gastroparesis.    ESOPHAGOGASTRODUODENOSCOPY (EGD) WITH PROPOFOL  N/A 04/26/2014   DOQ:dryjusxp'd ring/small HH/mild non-erosive gasrtitis/normal anastomosis   ESOPHAGOGASTRODUODENOSCOPY (EGD) WITH PROPOFOL  N/A 12/23/2014   Dr.Rourk- s/p prior hemigastrctomy, active oozing from anastomotic suture site, hemostasis achieved    ESOPHAGOGASTRODUODENOSCOPY (EGD) WITH PROPOFOL  N/A 05/23/2016   Dr. 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    ESOPHAGOGASTRODUODENOSCOPY (EGD) WITH PROPOFOL  N/A 05/11/2017   Procedure: ESOPHAGOGASTRODUODENOSCOPY (EGD) WITH PROPOFOL ;  Surgeon: Hollingshead Lamar CHRISTELLA, MD;  [facility];  Service: Endoscopy;  Laterality: N/A;   ESOPHAGOGASTRODUODENOSCOPY (EGD) WITH PROPOFOL  N/A 06/07/2021   Procedure: ESOPHAGOGASTRODUODENOSCOPY (EGD) WITH PROPOFOL ;  Surgeon: Hollingshead Lamar CHRISTELLA, MD;  [facility];  Service: Endoscopy;  Laterality: N/A;  11:45am   ESOPHAGOGASTRODUODENOSCOPY (EGD) WITH PROPOFOL  N/A 10/01/2021   Procedure: ESOPHAGOGASTRODUODENOSCOPY (EGD) WITH PROPOFOL ;  Surgeon: Cindie Carlin POUR, DO;  [facility];  Service: Endoscopy;  Laterality: N/A;  3:00pm   EXCISION OF MESH  08/2019   HEMORRHOID SURGERY N/A 06/18/2017   Procedure: THREE COLUMN EXTENSIVE HEMORRHOIDECTOMY;  Surgeon: Kallie Manuelita BROCKS, MD;  Location: [facility];  Service: General;  Laterality: N/A;   HERNIA REPAIR     INCISION AND DRAINAGE ABSCESS N/A 01/06/2017   Procedure: INCISION AND DRAINAGE ABDOMINAL WALL ABSCESS;  Surgeon: Mavis Anes, MD;  Location: [facility];  Service: General;  Laterality: N/A;   incisional hernial repair  09/05/2021   [place]   LEFT HEART CATH AND CORONARY ANGIOGRAPHY N/A 05/09/2020   Procedure: LEFT HEART CATH AND CORONARY ANGIOGRAPHY;  Surgeon: Elmira Newman PARAS, MD;  [facility]: [facility];  Service: Cardiovascular;  Laterality: N/A;   LEFT HEART CATH AND CORONARY ANGIOGRAPHY N/A 08/22/2023   Procedure: LEFT HEART CATH AND CORONARY ANGIOGRAPHY;  Surgeon: Verlin Lonni BIRCH, MD;  Location: [facility];  Service: Cardiovascular;  Laterality: N/A;   POLYPECTOMY  12/24/2018   Procedure: POLYPECTOMY;  Surgeon: Shaaron Lamar HERO, MD;  [facility];  Service:  Endoscopy;;  colon   tendon repar Right    wrist   WOUND EXPLORATION Right 06/24/2013   Procedure: exploration of traumatic wound right wrist;  Surgeon: Franky JONELLE Curia, MD;  [facility]: [facility];  Service: Orthopedics;  Laterality: Right;     HPI  from the history and physical done on the day of admission:   Chief Complaint: Abdominal pain, rectal bleeding    HPI: Angel Price is a 58 y.o. female with medical history significant for hypertension, mild nonobstructive CAD, rheumatoid arthritis, depression, anxiety, PUD status post hemigastrectomy, and rectal pressure for the past year who underwent colonoscopy and upper endoscopy this morning and now presents with abdominal pain and rectal bleeding.   Patient reports that she was in her usual state prior to the procedures this morning, had some mild pain in her lower abdomen and a small amount of rectal bleeding when she returned home, but became concerned later in the day as her pain and rectal bleeding both steadily worsened.  She reports passing large volumes of red blood as well as clots per rectum and has had constant, severe pain in the lower abdomen.  She has had severe nausea associated with this but no vomiting.   ED Course: Upon arrival to the ED, patient is found to be afebrile and saturating well on room air with normal HR and stable BP.  Labs are most notable for normal BUN, normal creatinine, and normal hemoglobin.  CT of the abdomen and pelvis is concerning for active extravasation into the distal sigmoid lumen.   GI (Dr. Eartha) was consulted by the ED PA, type and screen was performed, and the patient was given Dilaudid  and Zofran .   Review of Systems:  All other systems reviewed and apart from HPI, are negative.   Hospital Course:     Brief Narrative:  58 y.o. female with medical history significant for hypertension, mild nonobstructive CAD, rheumatoid arthritis, depression, anxiety, PUD status post hemigastrectomy, and  rectal pressure for the past year who underwent colonoscopy and upper endoscopy in am of 12/16/23, admitted later on  12/16/23 due to post polypectomy bleed (presented on 12/16/23 with abdominal pain and rectal bleeding).    -Assessment and Plan: 1)Post polypectomy Bleed-----CT [facility] with contrast shows-Positive for active extravasation/bleeding within the lumen of the distal sigmoid colon. --Pt underwent flexible sigmoidoscopy on 12/17/2023 by Dr. Shaaron-- sealed the polypectomy site with  (2) 360 clips. Good  hemostasis maintained Impression:               - Post polypectomy bleed                           - Blood and clot in left colon and rectum. Large                            area of clot adherent to polypectomy site proximal                            rectum.                           - Polypectomy site closed with 360 clips x 2 - H&H stable, no further fresh bleeding, per GI service okay to discharge home with outpatient follow-up with GI team   2) acute blood loss anemia--due to #1 above - Manage as above #1  3. CAD  - No anginal symptoms  - May restart aspirin  81 mg on 12/21/2023   4. RA  - Managed with Plaquenil       5. Depression  - Continue Cymbalta , Remeron     6. HTN--2. Hypertension  - BP has been soft due to GI bleed and n.p.o. status -- --restart amlodipine  and atenolol  on 12/20/2023  Discharge Condition: stable  Follow UP   Follow-up Information     Cindie Carlin POUR, DO. Schedule an appointment as soon as possible for a visit in 1 week(s).   Specialty: Gastroenterology Contact information: [address] [place] [address] [phone]                  Consults obtained - Gi  Diet and Activity recommendation:  As advised  Discharge Instructions    Discharge Instructions     Call MD for:  difficulty breathing, headache or visual disturbances   Complete by: As directed    Call MD for:  persistant dizziness or light-headedness   Complete by: As  directed    Call MD for:  persistant nausea and vomiting   Complete by: As directed    Call MD for:  temperature >100.4   Complete by: As directed    Diet - low sodium heart healthy   Complete by: As directed    Discharge instructions   Complete by: As directed    1)Avoid ibuprofen /Advil /Aleve /Motrin Josefine Powders/Naproxen /BC powders/Meloxicam/Diclofenac/Indomethacin and other Nonsteroidal anti-inflammatory medications as these will make you more likely to bleed and can cause stomach ulcers, can also cause Kidney problems.   2)Please Repeat CBC and BMP Blood Tests within 1 week with your PCP (Fanta, Benita Area, MD)  3)Follow-up Gastroenterologist Dr. Carlin Cindie with [facility]--- in 1 to 2 weeks for evaluation  -address: [address] [place], [place] [address], Phone: [phone]  4) please note that there has been some adjustments to your medications   Increase activity slowly   Complete by: As directed          Discharge Medications     Allergies as of 12/18/2023       Reactions   Metoclopramide  Anaphylaxis, Other (See Comments)   Reglan  EPS symptoms  Jerking movements   Codeine Hives        Medication List     STOP taking these medications    clopidogrel   75 MG tablet Commonly known as: PLAVIX    polyethylene glycol-electrolytes 420 g solution Commonly known as: NuLYTELY        TAKE these medications    acetaminophen  325 MG tablet Commonly known as: TYLENOL  Take 2 tablets (650 mg total) by mouth every 6 (six) hours as needed for mild pain (pain score 1-3) or fever (or Fever >/= 101).   amLODipine  5 MG tablet Commonly known as: NORVASC  Take 1 tablet (5 mg total) by mouth daily. Start taking on: December 20, 2023 What changed:  medication strength how much to take These instructions start on December 20, 2023. If you are unsure what to do until then, ask your doctor or other care provider.   aspirin  EC 81 MG  tablet Take 1 tablet (81 mg total) by mouth daily with breakfast. Start taking on: December 21, 2023 What changed:  when to take this These instructions start on December 21, 2023. If you are unsure what to do until then, ask your doctor or other care provider.   atenolol  50 MG tablet Commonly known as: TENORMIN  Take 1 tablet (50 mg total) by mouth daily. Start taking on: December 20, 2023 What changed: These instructions start on December 20, 2023. If you are unsure what to do until then, ask your doctor or other care provider.   B-12 1000 MCG Subl Place 1,000 mcg under the tongue daily.   Cholecalciferol  50 MCG (2000 UT) Caps Take 1 tablet by mouth daily.   DULoxetine  30 MG capsule Commonly known as: CYMBALTA  Take 30 mg by mouth 2 (two) times daily.   gabapentin  100 MG capsule Commonly known as: NEURONTIN  Take 100 mg by mouth 3 (three) times daily.   HYDROcodone -acetaminophen  5-325 MG tablet Commonly known as: NORCO/VICODIN Take 1 tablet by mouth 2 (two) times daily.   hydrocortisone  2.5 % rectal cream Commonly known as: ANUSOL -HC Apply 1 Application topically 2 (two) times daily.   hydroxychloroquine  200 MG tablet Commonly known as: PLAQUENIL  Take 1.5 tablets (300 mg total) by mouth daily.   mirtazapine  15 MG tablet Commonly known as: REMERON  Take 15 mg by mouth at bedtime as needed (for sleep).   nitroGLYCERIN  0.4 MG SL tablet Commonly known as: NITROSTAT  Place 1 tablet (0.4 mg total) under the tongue every 5 (five) minutes as needed for chest pain.   pantoprazole  40 MG tablet Commonly known as: PROTONIX  Take 1 tablet (40 mg total) by mouth daily before breakfast.   predniSONE  5 MG tablet Commonly known as: DELTASONE  Take 5 mg by mouth daily with breakfast.   promethazine  25 MG tablet Commonly known as: PHENERGAN  Take 0.5 tablets (12.5 mg total) by mouth every 6 (six) hours as needed for nausea or vomiting.   thiamine  100 MG tablet Commonly known as: VITAMIN  B1 Take 100 mg by mouth daily.   tiZANidine 2 MG tablet Commonly known as: ZANAFLEX Take 2 mg by mouth at bedtime.        Major procedures and Radiology Reports - PLEASE review detailed and final reports for all details, in brief -   CT ABDOMEN PELVIS W CONTRAST Result Date: 12/16/2023 CLINICAL DATA:  Rectal bleeding post colonoscopy EXAM: CT ABDOMEN AND PELVIS WITH CONTRAST TECHNIQUE: Multidetector CT imaging of the abdomen and pelvis was performed using the standard protocol following bolus administration of intravenous contrast. RADIATION DOSE REDUCTION: This exam was performed according to the departmental dose-optimization program which includes automated exposure control, adjustment of the mA and/or kV according to patient size and/or use  of iterative reconstruction technique. CONTRAST:  OMNIPAQUE  IOHEXOL  300 MG/ML  SOLN COMPARISON:  CT 03/26/2023, 02/21/2023 FINDINGS: Lower chest: Lung bases are clear Hepatobiliary: Cholecystectomy. Mild intra and extrahepatic biliary dilatation as before, common bile duct measures up to 14 mm. Pancreas: Unremarkable. No pancreatic ductal dilatation or surrounding inflammatory changes. Spleen: Normal in size without focal abnormality. Adrenals/Urinary Tract: Multiple surgical clips in the right suprarenal region. Right adrenal gland is surgically absent. Stable 2.1 cm left adrenal mass. Kidneys show no hydronephrosis. The bladder is unremarkable Stomach/Bowel: Postsurgical changes of the stomach and small bowel. No evidence for acute bowel wall thickening or bowel obstruction. Globular contrast within the lumen of the distal sigmoid colon, series 2, image 70, appears to augments on delayed image, series 6, image 70 and would be consistent with active bleeding. Vascular/Lymphatic: Aortic atherosclerosis. No enlarged abdominal or pelvic lymph nodes. Reproductive: Status post hysterectomy. No adnexal masses. Other: Negative for pelvic effusion or free air  Musculoskeletal: No acute or suspicious osseous lesion. Postsurgical scarring of the ventral abdominal wall IMPRESSION: 1. Positive for active extravasation/bleeding within the lumen of the distal sigmoid colon. 2. Postsurgical changes of the stomach and small bowel. No evidence for bowel obstruction or bowel wall thickening. 3. Stable 2.1 cm left adrenal mass/adenoma. 4. Aortic atherosclerosis. Critical Value/emergent results were called by telephone at the time of interpretation on 12/16/2023 at 9:51 pm to provider COLLIN HINNANT , who verbally acknowledged these results. Aortic Atherosclerosis (ICD10-I70.0). Electronically Signed   By: Luke Bun M.D.   On: 12/16/2023 21:51   NCV with EMG (electromyography) Result Date: 11/18/2023 Eldonna Novel, MD     11/24/2023  6:18 AM EMG & NCV Findings: Evaluation of the left median motor nerve showed prolonged distal onset latency (4.3 ms), reduced amplitude (4.1 mV), and decreased conduction velocity (Elbow-Wrist, 48 m/s).  The left median (across palm) sensory nerve showed prolonged distal peak latency (Wrist, 4.3 ms) and prolonged distal peak latency (Palm, 2.1 ms).  The left ulnar sensory nerve showed prolonged distal peak latency (3.8 ms) and decreased conduction velocity (Wrist-5th Digit, 37 m/s).  All remaining nerves (as indicated in the following tables) were within normal limits.  All examined muscles (as indicated in the following table) showed no evidence of electrical instability.  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 There is no significant electrodiagnostic evidence of any other focal nerve entrapment, brachial plexopathy or cervical radiculopathy. Recommendations: 1.  Follow-up with referring physician. 2.  Continue current management of symptoms. 3.  Continue use of resting splint  at night-time and as needed during the day. 4.  Suggest surgical evaluation. ___________________________ Prentice Eldonna FAAPMR Board Certified, American Board of Physical Medicine and Rehabilitation Nerve Conduction Studies Anti Sensory Summary Table  Stim Site NR Peak (ms) Norm Peak (ms) P-T Amp (V) Norm P-T Amp Site1 Site2 Delta-P (ms) Dist (cm) Vel (m/s) Norm Vel (m/s) Left Median Acr Palm Anti Sensory (2nd Digit)  29.3C Wrist    *4.3 <3.6 19.6 >10 Wrist Palm 2.2 0.0   Palm    *2.1 <2.0 1.7        Left Radial Anti Sensory (Base 1st Digit)  29.4C Wrist    2.3 <3.1 41.8  Wrist Base 1st Digit 2.3 0.0   Left Ulnar Anti Sensory (5th Digit)  29.7C Wrist    *3.8 <3.7 20.2 >15.0 Wrist 5th  Digit 3.8 14.0 *37 >38 Motor Summary Table  Stim Site NR Onset (ms) Norm Onset (ms) O-P Amp (mV) Norm O-P Amp Site1 Site2 Delta-0 (ms) Dist (cm) Vel (m/s) Norm Vel (m/s) Left Median Motor (Abd Poll Brev)  29.3C Wrist    *4.3 <4.2 *4.1 >5 Elbow Wrist 4.5 21.7 *48 >50 Elbow    8.8  3.3        Left Ulnar Motor (Abd Dig Min)  29.3C Wrist    3.1 <4.2 9.3 >3 B Elbow Wrist 3.2 18.5 58 >53 B Elbow    6.3  9.1  A Elbow B Elbow 1.5 10.0 67 >53 A Elbow    7.8  8.8        EMG  Side Muscle Nerve Root Ins Act Fibs Psw Amp Dur Poly Recrt Int Bruna Comment Left Abd Poll Brev Median C8-T1 Nml Nml Nml Nml Nml 0 Nml Nml  Left 1stDorInt Ulnar C8-T1 Nml Nml Nml Nml Nml 0 Nml Nml  Left PronatorTeres Median C6-7 Nml Nml Nml Nml Nml 0 Nml Nml  Left Biceps Musculocut C5-6 Nml Nml Nml Nml Nml 0 Nml Nml  Left Deltoid Axillary C5-6 Nml Nml Nml Nml Nml 0 Nml Nml  Nerve Conduction Studies Anti Sensory Left/Right Comparison  Stim Site L Lat (ms) R Lat (ms) L-R Lat (ms) L Amp (V) R Amp (V) L-R Amp (%) Site1 Site2 L Vel (m/s) R Vel (m/s) L-R Vel (m/s) Median Acr Palm Anti Sensory (2nd Digit)  29.3C Wrist *4.3   19.6   Wrist Palm    Palm *2.1   1.7        Radial Anti Sensory (Base 1st Digit)  29.4C Wrist 2.3   41.8   Wrist Base 1st Digit    Ulnar Anti Sensory (5th  Digit)  29.7C Wrist *3.8   20.2   Wrist 5th Digit *37   Motor Left/Right Comparison  Stim Site L Lat (ms) R Lat (ms) L-R Lat (ms) L Amp (mV) R Amp (mV) L-R Amp (%) Site1 Site2 L Vel (m/s) R Vel (m/s) L-R Vel (m/s) Median Motor (Abd Poll Brev)  29.3C Wrist *4.3   *4.1   Elbow Wrist *48   Elbow 8.8   3.3        Ulnar Motor (Abd Dig Min)  29.3C Wrist 3.1   9.3   B Elbow Wrist 58   B Elbow 6.3   9.1   A Elbow B Elbow 67   A Elbow 7.8   8.8        Waveforms:         Today   Subjective    Caniya Tagle today has no new complaints  No fever  Or chills   No Nausea, Vomiting or Diarrhea  -no further fresh rectal bleeding.   --Tolerating oral intake well         Patient has been seen and examined prior to discharge   Objective   Blood pressure 101/74, pulse 61, temperature 98.6 F (37 C), temperature source Oral, resp. rate 18, height 5' 3 (1.6 m), weight 75.8 kg, SpO2 95%.   Intake/Output Summary (Last 24 hours) at 12/18/2023 1015 Last data filed at 12/18/2023 0050 Gross per 24 hour  Intake 750 ml  Output --  Net 750 ml    Exam Gen:- Awake Alert, no acute distress  HEENT:- [place].AT, No sclera icterus Neck-Supple Neck,No JVD,.  Lungs-  CTAB , good air movement bilaterally CV- S1, S2 normal, regular Abd-  +  ve B.Sounds, Abd Soft, No significant tenderness,    Extremity/Skin:- No  edema,   good pulses Psych-affect is appropriate, oriented x3 Neuro-no new focal deficits, no tremors    Data Review   CBC w Diff:  Lab Results  Component Value Date   WBC 9.2 12/18/2023   HGB 11.0 (L) 12/18/2023   HGB 13.5 11/26/2023   HCT 32.2 (L) 12/18/2023   HCT 42.8 11/26/2023   PLT 302 12/18/2023   PLT 321 11/26/2023   LYMPHOPCT 30 12/16/2023   BANDSPCT 0 12/10/2013   MONOPCT 5 12/16/2023   EOSPCT 1 12/16/2023   BASOPCT 0 12/16/2023  CMP:  Lab Results  Component Value Date   NA 139 12/18/2023   NA 144 05/15/2021   K 3.6 12/18/2023   CL 108 12/18/2023   CO2 19 (L) 12/18/2023   BUN  10 12/18/2023   BUN 7 05/15/2021   CREATININE 0.57 12/18/2023   CREATININE 0.45 (L) 03/26/2023   PROT 6.8 12/16/2023   PROT 7.5 05/15/2021   ALBUMIN 4.0 12/16/2023   ALBUMIN 4.5 05/15/2021   BILITOT 0.4 12/16/2023   BILITOT 0.5 05/15/2021   ALKPHOS 107 12/16/2023   AST 16 12/16/2023   ALT 14 12/16/2023  .  Total Discharge time is about 33 minutes  Rendall Carwin M.D on 12/18/2023 at 10:15 AM  Go to [email or web address] -  for contact info  [facility] - Office  [phone]

## 2023-12-18 NOTE — TOC Transition Note (Signed)
 Transition of Care [facility]) - Discharge Note   Patient Details  Name: Angel Price MRN: [id number] Date of Birth: 02/15/1966  Transition of [facility]) CM/SW Contact:  [name] [name] Phone Number: 12/18/2023, 9:59 AM   Clinical Narrative:     Patient is being DC today. No ICM needs during this hospital stay. ICM signing off.    Final next level of care: Home/Self Care Barriers to Discharge: Barriers Resolved   Patient Goals and CMS Choice Patient states their goals for this hospitalization and ongoing recovery are:: return back home CMS Medicare.gov Compare Post Acute Care list provided to:: Patient        Discharge Placement              Patient chooses bed at:  [facility]) Patient to be transferred to facility by: POV Name of family member notified: Marena Patient and family notified of of transfer: 12/18/23  Discharge Plan and Services Additional resources added to the After Visit Summary for                                       Social Drivers of Health (SDOH) Interventions SDOH Screenings   Food Insecurity: No Food Insecurity (12/17/2023)  Housing: Low Risk  (12/17/2023)  Transportation Needs: No Transportation Needs (12/17/2023)  Utilities: Not At Risk (12/17/2023)  Alcohol Screen: Low Risk  (03/15/2019)  Depression (PHQ2-9): Medium Risk (03/15/2019)  Financial Resource Strain: Low Risk  (09/11/2023)   Received from [facility]  Physical Activity: Unknown (09/11/2023)   Received from [facility]  Social Connections: Socially Integrated (09/11/2023)   Received from [facility]  Stress: No Stress Concern Present (09/11/2023)   Received from [facility]  Tobacco Use: High Risk (12/17/2023)     Readmission Risk Interventions    12/18/2023    9:58 AM 03/15/2023    2:12 PM  Readmission Risk Prevention Plan  Transportation Screening Complete Complete  PCP or Specialist Appt within 3-5 Days  Complete  HRI or Home Care Consult Complete  Complete  Social Work Consult for Recovery Care Planning/Counseling Complete Complete  Palliative Care Screening Not Applicable Complete  Medication Review Oceanographer) Complete Complete

## 2023-12-18 NOTE — Discharge Instructions (Signed)
 1)Avoid ibuprofen /Advil /Aleve /Motrin Josefine Powders/Naproxen /BC powders/Meloxicam/Diclofenac/Indomethacin and other Nonsteroidal anti-inflammatory medications as these will make you more likely to bleed and can cause stomach ulcers, can also cause Kidney problems.   2)Please Repeat CBC and BMP Blood Tests within 1 week with your PCP (Fanta, Benita Area, MD)  3)Follow-up Gastroenterologist Dr. Carlin Hasty with [facility]--- in 1 to 2 weeks for evaluation  -address: [address] [place], [place] [address], Phone: [phone]  4) please note that there has been some adjustments to your medications

## 2023-12-18 NOTE — Plan of Care (Signed)

## 2023-12-18 NOTE — Telephone Encounter (Signed)
 Patient needs hospital follow up with Dr. Shaaron in 2 weeks.

## 2023-12-25 ENCOUNTER — Other Ambulatory Visit: Payer: Self-pay

## 2023-12-25 DIAGNOSIS — G5603 Carpal tunnel syndrome, bilateral upper limbs: Secondary | ICD-10-CM

## 2024-01-02 ENCOUNTER — Other Ambulatory Visit: Payer: Self-pay

## 2024-01-02 ENCOUNTER — Encounter (HOSPITAL_BASED_OUTPATIENT_CLINIC_OR_DEPARTMENT_OTHER): Payer: Self-pay | Admitting: Orthopedic Surgery

## 2024-01-02 NOTE — Progress Notes (Signed)
   01/02/24 1140  PAT Phone Screen  Is the patient taking a GLP-1 receptor agonist? No  Do You Have Diabetes? No  Do You Have Hypertension? Yes  Have You Ever Been to the ER for Asthma? No  Have You Taken Oral Steroids in the Past 3 Months? (S)  Yes (recently finished prednisone  dose pack for lupus flare-up)  Do you Take Phenteramine or any Other Diet Drugs? No  Recent  Lab Work, EKG, CXR? Yes  Where was this test performed? 08-21-23 EKG, 12-18-23 BMP, CBC  Do you have a history of heart problems? (S)  Yes (Non-obstr CAD)  Cardiologist Name Dr Adelia  Have you ever had tests on your heart? Yes  What cardiac tests were performed? Cardiac Cath;Stress Test  What date/year were cardiac tests completed? 08-19-23 NM stress test EF 60%, 08-22-23 heart cath-mild non-obstr CAD  Results viewable: [facility] Media Tab  Any Recent Hospitalizations? (S)  Yes (hospitalized 12-16-23 to 12-18-23 with lower GI bleed)  Height 5' 3 (1.6 m)  Weight 76.2 kg  Pat Appointment Scheduled No  Reason for No Appointment Not Needed

## 2024-01-06 ENCOUNTER — Ambulatory Visit: Admitting: Internal Medicine

## 2024-01-06 ENCOUNTER — Encounter: Payer: Self-pay | Admitting: Internal Medicine

## 2024-01-06 VITALS — BP 133/85 | HR 79 | Temp 98.6°F | Ht 63.0 in | Wt 169.0 lb

## 2024-01-06 DIAGNOSIS — R198 Other specified symptoms and signs involving the digestive system and abdomen: Secondary | ICD-10-CM

## 2024-01-06 DIAGNOSIS — Z860101 Personal history of adenomatous and serrated colon polyps: Secondary | ICD-10-CM

## 2024-01-06 DIAGNOSIS — K2289 Other specified disease of esophagus: Secondary | ICD-10-CM

## 2024-01-06 DIAGNOSIS — K922 Gastrointestinal hemorrhage, unspecified: Secondary | ICD-10-CM | POA: Diagnosis not present

## 2024-01-06 NOTE — Patient Instructions (Signed)
 It was good to see you again today.  Your hemoglobin is improved  Glad your GI symptoms have resolved  Continue pantoprazole  40 mg daily best taken before breakfast.  Would avoid NSAIDs like ibuprofen  and Aleve  is much as possible.  Because you have multiple polyps removed from your colon recently should return to have a repeat colonoscopy in 3 years  Plan to see you back in the office in 1 year and as needed

## 2024-01-06 NOTE — Progress Notes (Unsigned)
 Gastroenterology Progress Note    Primary Care Physician:  Carlette Benita Area, MD Primary Gastroenterologist:  Dr.   Pre-Procedure History & Physical: HPI:  Angel Price is a 58 y.o. female here from endoscopy and hospitalization for post polypectomy bleed.  Noncritical Schatzki's ring dilated with balloon recently with resolution of dysphagia biopsies of the esophagus negative for EOE.  Multiple polyps removed from her colon.-Adenomas.  10 mm polyp cold snared from  sigmoid;  bled postop - positive CTA distal sigmoid; flex sig performed and bleeding polypectomy site clipped.  Hemostasis achieved.  She states that she done very well since her hospitalization;  the rectal pressure she had been having for years also has resolved.  No dysphagia  - very happy on Protonix  40 mg daily;  hemoglobin improved to 11 with last check a couple weeks ago  Based on pathology findings she will have another colonoscopy in 3 years. Past Medical History:  Diagnosis Date   Abscess    soft tissue   Adrenal mass    Alcohol abuse    Anemia    Anxiety    Blood transfusion without reported diagnosis    Chronic abdominal pain    Chronic wound infection of abdomen    Colon polyp    colonoscopy 04/2014   Coronary artery disease    Mild non obstr CAD   Depression    Discoid lupus erythematosus    Diverticulosis    colonoscopy 04/2014 moderat pan colonic   Drug-seeking behavior    Gastritis    EGD 05/2014   Gastroparesis 01/2014   GERD (gastroesophageal reflux disease)    Hemorrhoid    internal large   Hiatal hernia    History of Billroth II operation    Hypertension    Lung nodule    CT 02/2014 needs repeat 1 month   Lung nodule < 6cm on CT 04/25/2014   Lupus    Malingering    Migraine    Nausea and vomiting    chronic, recurrent   Pancreatitis    Schatzki's ring    patent per EGD 04/2014   Sickle cell trait    Stroke (HCC)    Suicide attempt (HCC)    Thyroid  disease 2000    overactive, radiation    Past Surgical History:  Procedure Laterality Date   ABDOMINAL HYSTERECTOMY  2013   [place]   ABDOMINAL HYSTERECTOMY     ABDOMINAL SURGERY     ADRENALECTOMY Right    AGILE CAPSULE N/A 01/05/2015   Procedure: AGILE CAPSULE;  Surgeon: Lamar CHRISTELLA Hollingshead, MD;  [facility];  Service: Endoscopy;  Laterality: N/A;  0700   BALLOON DILATION N/A 10/01/2021   Procedure: BALLOON DILATION;  Surgeon: Cindie Carlin POUR, DO;  [facility];  Service: Endoscopy;  Laterality: N/A;   Billroth II procedure      [place], first 2000, 2005/2006.   bilroth 2     BIOPSY  05/20/2013   Procedure: BIOPSIES OF ASCENDING AND SIGMOID COLON;  Surgeon: Lamar CHRISTELLA Hollingshead, MD;  Location: [facility];  Service: Endoscopy;;   BIOPSY  04/26/2014   Procedure: BIOPSIES;  Surgeon: Margo LITTIE Haddock, MD;  Location: [facility];  Service: Endoscopy;;   BIOPSY  12/24/2018   Procedure: BIOPSY;  Surgeon: Hollingshead Lamar CHRISTELLA, MD;  [facility];  Service: Endoscopy;;  colon   CHOLECYSTECTOMY     COLONOSCOPY     in [place]   COLONOSCOPY N/A 12/16/2023  Procedure: COLONOSCOPY;  Surgeon: Cindie Carlin POUR, DO;  [facility];  Service: Endoscopy;  Laterality: N/A;  145pm, ok rm 1   COLONOSCOPY WITH PROPOFOL  N/A 05/20/2013   Dr.Lilyth Lawyer- inadequate prep, normal appearing rectum, grossly normal colon aside from pancolonic diverticula, normal terminal ileum bx= unremarkable colonic mucosa. Due for early interval 2016.    COLONOSCOPY WITH PROPOFOL  N/A 04/26/2014   DOQ:wnmfjo ileum/one colon polyp removed/moderate pan-colonic diverticulosis/large internal hemorrhoids   COLONOSCOPY WITH PROPOFOL  N/A 12/23/2014   Dr.Rashid Whitenight- minimal internal hemorrhoids, pancolonic diverticulosis   COLONOSCOPY WITH PROPOFOL  N/A 08/23/2016   Procedure: COLONOSCOPY WITH PROPOFOL ;  Surgeon: Harvey Margo CROME, MD;  [facility];  Service: Endoscopy;  Laterality: N/A;   COLONOSCOPY WITH PROPOFOL  N/A  12/24/2018   Pancolonic diverticulosis, normal TI, one 5 mm polyp in rectum (tubular adenoma). Somewhat friable hemorrhagic mucosa in area of IC valve/cecum, possibly related to scope trauma s/p biopsy. Repeat in 7 years.   DEBRIDEMENT OF ABDOMINAL WALL ABSCESS N/A 02/08/2013   Procedure: DEBRIDEMENT OF ABDOMINAL WALL ABSCESS;  Surgeon: Oneil DELENA Budge, MD;  Location: [facility];  Service: General;  Laterality: N/A;   ESOPHAGEAL DILATION N/A 12/16/2023   Procedure: DILATION, ESOPHAGUS;  Surgeon: Cindie Carlin POUR, DO;  [facility];  Service: Endoscopy;  Laterality: N/A;   ESOPHAGOGASTRODUODENOSCOPY N/A 12/16/2023   Procedure: EGD (ESOPHAGOGASTRODUODENOSCOPY);  Surgeon: Cindie Carlin POUR, DO;  [facility];  Service: Endoscopy;  Laterality: N/A;   ESOPHAGOGASTRODUODENOSCOPY (EGD) WITH PROPOFOL  N/A 05/20/2013   Dr.Koriana Stepien- s/p prior gastric surgery with normal esophagus, residual gastric mucosa and patent efferent limb   ESOPHAGOGASTRODUODENOSCOPY (EGD) WITH PROPOFOL  N/A 02/03/2014   Dr. Shaaron:  s/p hemigastrectomy with retained gastric contents. Residual gastric mucosa and efferent limb appeared normal otherwise. Query gastroparesis.    ESOPHAGOGASTRODUODENOSCOPY (EGD) WITH PROPOFOL  N/A 04/26/2014   DOQ:dryjusxp'd ring/small HH/mild non-erosive gasrtitis/normal anastomosis   ESOPHAGOGASTRODUODENOSCOPY (EGD) WITH PROPOFOL  N/A 12/23/2014   Dr.Tanaiya Kolarik- s/p prior hemigastrctomy, active oozing from anastomotic suture site, hemostasis achieved   ESOPHAGOGASTRODUODENOSCOPY (EGD) WITH PROPOFOL  N/A 05/23/2016   Dr. 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    ESOPHAGOGASTRODUODENOSCOPY (EGD) WITH PROPOFOL  N/A 05/11/2017   Procedure: ESOPHAGOGASTRODUODENOSCOPY (EGD) WITH PROPOFOL ;  Surgeon: Shaaron Lamar HERO, MD;  [facility];  Service: Endoscopy;  Laterality: N/A;   ESOPHAGOGASTRODUODENOSCOPY  (EGD) WITH PROPOFOL  N/A 06/07/2021   Procedure: ESOPHAGOGASTRODUODENOSCOPY (EGD) WITH PROPOFOL ;  Surgeon: Shaaron Lamar HERO, MD;  [facility];  Service: Endoscopy;  Laterality: N/A;  11:45am   ESOPHAGOGASTRODUODENOSCOPY (EGD) WITH PROPOFOL  N/A 10/01/2021   Procedure: ESOPHAGOGASTRODUODENOSCOPY (EGD) WITH PROPOFOL ;  Surgeon: Cindie Carlin POUR, DO;  [facility];  Service: Endoscopy;  Laterality: N/A;  3:00pm   EXCISION OF MESH  08/2019   FLEXIBLE SIGMOIDOSCOPY N/A 12/17/2023   Procedure: KINGSTON SIDE;  Surgeon: Shaaron Lamar HERO, MD;  [facility];  Service: Endoscopy;  Laterality: N/A;   HEMORRHOID SURGERY N/A 06/18/2017   Procedure: THREE COLUMN EXTENSIVE HEMORRHOIDECTOMY;  Surgeon: Kallie Manuelita BROCKS, MD;  Location: [facility];  Service: General;  Laterality: N/A;   HERNIA REPAIR     INCISION AND DRAINAGE ABSCESS N/A 01/06/2017   Procedure: INCISION AND DRAINAGE ABDOMINAL WALL ABSCESS;  Surgeon: Budge Oneil, MD;  Location: [facility];  Service: General;  Laterality: N/A;   incisional hernial repair  09/05/2021   [place]   LEFT HEART CATH AND CORONARY ANGIOGRAPHY N/A 05/09/2020  Procedure: LEFT HEART CATH AND CORONARY ANGIOGRAPHY;  Surgeon: Elmira Newman PARAS, MD;  [facility]: [facility];  Service: Cardiovascular;  Laterality: N/A;   LEFT HEART CATH AND CORONARY ANGIOGRAPHY N/A 08/22/2023   Procedure: LEFT HEART CATH AND CORONARY ANGIOGRAPHY;  Surgeon: Verlin Lonni BIRCH, MD;  Location: [facility];  Service: Cardiovascular;  Laterality: N/A;   POLYPECTOMY  12/24/2018   Procedure: POLYPECTOMY;  Surgeon: Shaaron Lamar HERO, MD;  [facility];  Service: Endoscopy;;  colon   tendon repar Right    wrist   WOUND EXPLORATION Right 06/24/2013   Procedure: exploration of traumatic wound right wrist;  Surgeon: Franky JONELLE Curia, MD;  [facility];  Service: Orthopedics;  Laterality: Right;    Prior to Admission medications   Medication  Sig Start Date End Date Taking? Authorizing Provider  amLODipine  (NORVASC ) 10 MG tablet Take 10 mg by mouth daily. 12/22/23  Yes [provider]  aspirin  EC 81 MG tablet Take 1 tablet (81 mg total) by mouth daily with breakfast. 12/21/23  Yes Emokpae, Courage, MD  atenolol  (TENORMIN ) 50 MG tablet Take 1 tablet (50 mg total) by mouth daily. 12/20/23  Yes Emokpae, Courage, MD  Cholecalciferol  50 MCG (2000 UT) CAPS Take 1 tablet by mouth daily.   Yes [provider]  Cyanocobalamin  (B-12) 1000 MCG SUBL Place 1,000 mcg under the tongue daily. 11/26/23  Yes Ezzard Sonny RAMAN, PA-C  DULoxetine  (CYMBALTA ) 30 MG capsule Take 30 mg by mouth 2 (two) times daily. 08/14/21  Yes [provider]  gabapentin  (NEURONTIN ) 100 MG capsule Take 100 mg by mouth 3 (three) times daily. 03/03/23  Yes [provider]  HYDROcodone -acetaminophen  (NORCO/VICODIN) 5-325 MG tablet Take 1 tablet by mouth 2 (two) times daily.   Yes [provider]  hydroxychloroquine  (PLAQUENIL ) 200 MG tablet Take 1.5 tablets (300 mg total) by mouth daily. 11/03/23  Yes Rice, Lonni ORN, MD  mirtazapine  (REMERON ) 15 MG tablet Take 15 mg by mouth at bedtime as needed (for sleep). 08/10/21  Yes [provider]  nitroGLYCERIN  (NITROSTAT ) 0.4 MG SL tablet Place 1 tablet (0.4 mg total) under the tongue every 5 (five) minutes as needed for chest pain. 12/29/19 01/06/24 Yes Patwardhan, Newman PARAS, MD  pantoprazole  (PROTONIX ) 40 MG tablet Take 1 tablet (40 mg total) by mouth daily before breakfast. 12/18/23  Yes Emokpae, Courage, MD  promethazine  (PHENERGAN ) 25 MG tablet Take 0.5 tablets (12.5 mg total) by mouth every 6 (six) hours as needed for nausea or vomiting. 12/18/23  Yes Emokpae, Courage, MD  thiamine  (VITAMIN B1) 100 MG tablet Take 100 mg by mouth daily. 04/26/23  Yes [provider]  tiZANidine (ZANAFLEX) 2 MG tablet Take 2 mg by mouth at bedtime. 10/17/23  Yes [provider]     Allergies as of 01/06/2024 - Review Complete 01/06/2024  Allergen Reaction Noted   Metoclopramide  Anaphylaxis and Other (See Comments) 08/08/2016   Codeine Hives 05/19/2019    Family History  Problem Relation Age of Onset   Drug abuse Mother    Lung cancer Father    Cancer Father        mets   Drug abuse Sister    Drug abuse Brother    Breast cancer Maternal Aunt    Bipolar disorder Maternal Aunt    Drug abuse Maternal Aunt    Colon cancer Maternal Grandmother        late 48s, early 23s   Bipolar disorder Paternal Grandfather  Brain cancer Son    Schizophrenia Son    Cancer Son        brain   Bipolar disorder Cousin    Liver disease Neg Hx     Social History   Socioeconomic History   Marital status: Divorced    Spouse name: Not on file   Number of children: 4   Years of education: 10   Highest education level: Not on file  Occupational History   Not on file  Tobacco Use   Smoking status: Every Day    Current packs/day: 0.25    Average packs/day: 0.3 packs/day for 35.0 years (8.8 ttl pk-yrs)    Types: Cigarettes    Passive exposure: Past   Smokeless tobacco: Never  Vaping Use   Vaping status: Never Used  Substance and Sexual Activity   Alcohol use: Not Currently    Comment: quit in 01/2023, previously heavy   Drug use: Not Currently    Types: Cocaine    Comment: no cocaine in4  years per patient (08/20/21)   Sexual activity: Not Currently    Birth control/protection: Surgical  Other Topics Concern   Not on file  Social History Narrative   Caffeine- tea occass   Social Drivers of Health   Financial Resource Strain: Low Risk  (09/11/2023)   Received from [facility]   Overall Financial Resource Strain (CARDIA)    Difficulty of Paying Living Expenses: Not hard at all  Food Insecurity: No Food Insecurity (12/17/2023)   Hunger Vital Sign    Worried About Running Out of Food in the Last Year: Never true    Ran Out of Food in the Last Year: Never  true  Transportation Needs: No Transportation Needs (12/17/2023)   PRAPARE - Administrator, Civil Service (Medical): No    Lack of Transportation (Non-Medical): No  Physical Activity: Unknown (09/11/2023)   Received from [facility]   Exercise Vital Sign    On average, how many days per week do you engage in moderate to strenuous exercise (like a brisk walk)?: 0 days    Minutes of Exercise per Session: Not on file  Stress: No Stress Concern Present (09/11/2023)   Received from [facility] - Occupational Stress Questionnaire    Feeling of Stress : Only a little  Social Connections: Socially Integrated (09/11/2023)   [facility]   Social Network    How would you rate your social network (family, work, friends)?: Good participation with social networks  Intimate Partner Violence: Not At Risk (12/17/2023)   Humiliation, Afraid, Rape, and Kick questionnaire    Fear of Current or Ex-Partner: No    Emotionally Abused: No    Physically Abused: No    Sexually Abused: No    Review of Systems   See HPI, otherwise negative ROS  Physical Exam: BP 133/85 (BP Location: Right Arm, Patient Position: Sitting, Cuff Size: Normal)   Pulse 79   Temp 98.6 F (37 C) (Oral)   Ht 5' 3 (1.6 m)   Wt 169 lb (76.7 kg)   LMP  (LMP Unknown)   SpO2 94%   BMI 29.94 kg/m  General:   Alert,  Well-developed, well-nourished, pleasant and cooperative in NAD Abdomen: Non-distended, extensive surgical scar keloid formation.  Normal bowel sounds.  Soft and nontender without appreciable mass or hepatosplenomegaly.    Impression/Plan:   58 year old lady with dysphagia s- tatus post dilation with Schatzki's ring  and colonoscopy revealing multiple colonic adenomas removed.  Postop course complicated by a sigmoid polypectomy site bleed.  This lesion was clipped.  She has done very well since that time her GI symptoms have all resolved including  the rectal pressure.  Not sure what the etiology of this symptom was or why it improved but glad she is now asymptomatic  Recommendations:  Continue pantoprazole  40 mg daily best taken before breakfast.  Would avoid NSAIDs like ibuprofen  and Aleve  is much as possible.  Because you have multiple polyps removed from your colon recently should return to have a repeat colonoscopy in 3 years  Plan to see you back in the office in 1 year and as needed      Notice: This dictation was prepared with Dragon dictation along with smaller phrase technology. Any transcriptional errors that result from this process are unintentional and may not be corrected upon review.

## 2024-01-09 ENCOUNTER — Ambulatory Visit (HOSPITAL_BASED_OUTPATIENT_CLINIC_OR_DEPARTMENT_OTHER)
Admission: RE | Admit: 2024-01-09 | Discharge: 2024-01-09 | Disposition: A | Discharge status: Home / Self Care | Attending: Orthopedic Surgery | Admitting: Orthopedic Surgery

## 2024-01-09 ENCOUNTER — Encounter (HOSPITAL_BASED_OUTPATIENT_CLINIC_OR_DEPARTMENT_OTHER): Payer: Self-pay | Admitting: Orthopedic Surgery

## 2024-01-09 ENCOUNTER — Encounter (HOSPITAL_BASED_OUTPATIENT_CLINIC_OR_DEPARTMENT_OTHER): Admission: RE | Disposition: A | Payer: Self-pay | Source: Home / Self Care | Attending: Orthopedic Surgery

## 2024-01-09 ENCOUNTER — Ambulatory Visit (HOSPITAL_BASED_OUTPATIENT_CLINIC_OR_DEPARTMENT_OTHER): Admitting: Anesthesiology

## 2024-01-09 ENCOUNTER — Other Ambulatory Visit: Payer: Self-pay | Admitting: Orthopedic Surgery

## 2024-01-09 ENCOUNTER — Telehealth: Payer: Self-pay | Admitting: Orthopedic Surgery

## 2024-01-09 ENCOUNTER — Other Ambulatory Visit: Payer: Self-pay

## 2024-01-09 DIAGNOSIS — F17210 Nicotine dependence, cigarettes, uncomplicated: Secondary | ICD-10-CM | POA: Diagnosis not present

## 2024-01-09 DIAGNOSIS — I4891 Unspecified atrial fibrillation: Secondary | ICD-10-CM | POA: Diagnosis not present

## 2024-01-09 DIAGNOSIS — K219 Gastro-esophageal reflux disease without esophagitis: Secondary | ICD-10-CM | POA: Insufficient documentation

## 2024-01-09 DIAGNOSIS — Z79891 Long term (current) use of opiate analgesic: Secondary | ICD-10-CM | POA: Diagnosis not present

## 2024-01-09 DIAGNOSIS — I6930 Unspecified sequelae of cerebral infarction: Secondary | ICD-10-CM | POA: Diagnosis not present

## 2024-01-09 DIAGNOSIS — I2510 Atherosclerotic heart disease of native coronary artery without angina pectoris: Secondary | ICD-10-CM | POA: Insufficient documentation

## 2024-01-09 DIAGNOSIS — Z01818 Encounter for other preprocedural examination: Secondary | ICD-10-CM

## 2024-01-09 DIAGNOSIS — G5602 Carpal tunnel syndrome, left upper limb: Secondary | ICD-10-CM

## 2024-01-09 DIAGNOSIS — I10 Essential (primary) hypertension: Secondary | ICD-10-CM | POA: Diagnosis not present

## 2024-01-09 DIAGNOSIS — F319 Bipolar disorder, unspecified: Secondary | ICD-10-CM | POA: Insufficient documentation

## 2024-01-09 DIAGNOSIS — K449 Diaphragmatic hernia without obstruction or gangrene: Secondary | ICD-10-CM | POA: Insufficient documentation

## 2024-01-09 DIAGNOSIS — F418 Other specified anxiety disorders: Secondary | ICD-10-CM | POA: Diagnosis not present

## 2024-01-09 HISTORY — DX: Atherosclerotic heart disease of native coronary artery without angina pectoris: I25.10

## 2024-01-09 HISTORY — PX: CARPAL TUNNEL RELEASE: SHX101

## 2024-01-09 SURGERY — RELEASE, CARPAL TUNNEL, ENDOSCOPIC
Anesthesia: General | Site: Wrist | Laterality: Left

## 2024-01-09 MED ORDER — OXYCODONE HCL 5 MG PO TABS
5.0000 mg | ORAL_TABLET | Freq: Once | ORAL | Status: AC | PRN
  Administered 2024-01-09: 5 mg via ORAL

## 2024-01-09 MED ORDER — ONDANSETRON HCL 4 MG/2ML IJ SOLN
INTRAMUSCULAR | Status: DC | PRN
  Administered 2024-01-09: 4 mg via INTRAVENOUS

## 2024-01-09 MED ORDER — HYDROCODONE-ACETAMINOPHEN 5-325 MG PO TABS: 1.0000 | ORAL_TABLET | Freq: Four times a day (QID) | ORAL | 0 refills | Status: DC | PRN

## 2024-01-09 MED ORDER — LACTATED RINGERS IV SOLN: INTRAVENOUS | Status: DC

## 2024-01-09 MED ORDER — MIDAZOLAM HCL 2 MG/2ML IJ SOLN
INTRAMUSCULAR | Status: AC
  Filled 2024-01-09: qty 2

## 2024-01-09 MED ORDER — OXYCODONE HCL 5 MG PO TABS: 5.0000 mg | ORAL_TABLET | Freq: Four times a day (QID) | ORAL | 0 refills | Status: DC | PRN

## 2024-01-09 MED ORDER — OXYCODONE HCL 5 MG/5ML PO SOLN: 5.0000 mg | Freq: Once | ORAL | Status: AC | PRN

## 2024-01-09 MED ORDER — PHENYLEPHRINE HCL (PRESSORS) 10 MG/ML IV SOLN
INTRAVENOUS | Status: DC | PRN
  Administered 2024-01-09: 80 ug via INTRAVENOUS

## 2024-01-09 MED ORDER — AMISULPRIDE (ANTIEMETIC) 5 MG/2ML IV SOLN
INTRAVENOUS | Status: AC
  Filled 2024-01-09: qty 4

## 2024-01-09 MED ORDER — OXYCODONE HCL 5 MG PO TABS
ORAL_TABLET | ORAL | Status: AC
  Filled 2024-01-09: qty 1

## 2024-01-09 MED ORDER — PROPOFOL 10 MG/ML IV BOLUS
INTRAVENOUS | Status: AC
  Filled 2024-01-09: qty 20

## 2024-01-09 MED ORDER — ACETAMINOPHEN 500 MG PO TABS
ORAL_TABLET | ORAL | Status: AC
  Filled 2024-01-09: qty 2

## 2024-01-09 MED ORDER — FENTANYL CITRATE (PF) 100 MCG/2ML IJ SOLN
INTRAMUSCULAR | Status: AC
  Filled 2024-01-09: qty 2

## 2024-01-09 MED ORDER — LIDOCAINE 2% (20 MG/ML) 5 ML SYRINGE
INTRAMUSCULAR | Status: AC
  Filled 2024-01-09: qty 5

## 2024-01-09 MED ORDER — PHENYLEPHRINE 80 MCG/ML (10ML) SYRINGE FOR IV PUSH (FOR BLOOD PRESSURE SUPPORT)
PREFILLED_SYRINGE | INTRAVENOUS | Status: AC
Start: 2024-01-09 — End: 2024-01-09
  Filled 2024-01-09: qty 10

## 2024-01-09 MED ORDER — DEXAMETHASONE SOD PHOSPHATE PF 10 MG/ML IJ SOLN
INTRAMUSCULAR | Status: DC | PRN
  Administered 2024-01-09: 5 mg via INTRAVENOUS

## 2024-01-09 MED ORDER — LIDOCAINE HCL (CARDIAC) PF 100 MG/5ML IV SOSY
PREFILLED_SYRINGE | INTRAVENOUS | Status: DC | PRN
  Administered 2024-01-09: 60 mg via INTRAVENOUS

## 2024-01-09 MED ORDER — FENTANYL CITRATE (PF) 100 MCG/2ML IJ SOLN
25.0000 ug | INTRAMUSCULAR | Status: DC | PRN
  Administered 2024-01-09 (×2): 25 ug via INTRAVENOUS

## 2024-01-09 MED ORDER — CEFAZOLIN SODIUM-DEXTROSE 2-4 GM/100ML-% IV SOLN
2.0000 g | INTRAVENOUS | Status: AC
  Administered 2024-01-09: 2 g via INTRAVENOUS

## 2024-01-09 MED ORDER — FENTANYL CITRATE (PF) 100 MCG/2ML IJ SOLN
INTRAMUSCULAR | Status: DC | PRN
  Administered 2024-01-09 (×2): 25 ug via INTRAVENOUS
  Administered 2024-01-09: 50 ug via INTRAVENOUS

## 2024-01-09 MED ORDER — KETOROLAC TROMETHAMINE 30 MG/ML IJ SOLN: 30.0000 mg | Freq: Once | INTRAMUSCULAR | Status: DC | PRN

## 2024-01-09 MED ORDER — AMISULPRIDE (ANTIEMETIC) 5 MG/2ML IV SOLN
10.0000 mg | Freq: Once | INTRAVENOUS | Status: AC | PRN
  Administered 2024-01-09: 10 mg via INTRAVENOUS

## 2024-01-09 MED ORDER — MIDAZOLAM HCL 5 MG/5ML IJ SOLN
INTRAMUSCULAR | Status: DC | PRN
  Administered 2024-01-09: 2 mg via INTRAVENOUS

## 2024-01-09 MED ORDER — PROPOFOL 10 MG/ML IV BOLUS
INTRAVENOUS | Status: DC | PRN
  Administered 2024-01-09: 150 mg via INTRAVENOUS

## 2024-01-09 MED ORDER — LIDOCAINE-EPINEPHRINE (PF) 1 %-1:200000 IJ SOLN
INTRAMUSCULAR | Status: AC
  Filled 2024-01-09: qty 60

## 2024-01-09 MED ORDER — LIDOCAINE-EPINEPHRINE (PF) 1 %-1:200000 IJ SOLN
INTRAMUSCULAR | Status: DC | PRN
  Administered 2024-01-09: 10 mL

## 2024-01-09 MED ORDER — ACETAMINOPHEN 500 MG PO TABS
1000.0000 mg | ORAL_TABLET | Freq: Once | ORAL | Status: AC
  Administered 2024-01-09: 1000 mg via ORAL

## 2024-01-09 MED ORDER — CEFAZOLIN SODIUM-DEXTROSE 2-4 GM/100ML-% IV SOLN
INTRAVENOUS | Status: AC
  Filled 2024-01-09: qty 100

## 2024-01-09 MED ORDER — ONDANSETRON HCL 4 MG/2ML IJ SOLN
INTRAMUSCULAR | Status: AC
  Filled 2024-01-09: qty 2

## 2024-01-09 MED ORDER — 0.9 % SODIUM CHLORIDE (POUR BTL) OPTIME
TOPICAL | Status: DC | PRN
  Administered 2024-01-09: 500 mL

## 2024-01-09 SURGICAL SUPPLY — 35 items
APPLICATOR COTTON TIP 6 STRL (MISCELLANEOUS) ×1 IMPLANT
BLADE SURG 15 STRL LF DISP TIS (BLADE) ×2 IMPLANT
BNDG COHESIVE 4X5 TAN STRL LF (GAUZE/BANDAGES/DRESSINGS) ×1 IMPLANT
BNDG COMPR ESMARK 4X3 LF (GAUZE/BANDAGES/DRESSINGS) ×1 IMPLANT
BNDG ELASTIC 3INX 5YD STR LF (GAUZE/BANDAGES/DRESSINGS) IMPLANT
CHLORAPREP W/TINT 26 (MISCELLANEOUS) ×1 IMPLANT
CORD BIPOLAR FORCEPS 12FT (ELECTRODE) ×1 IMPLANT
COVER BACK TABLE 60X90IN (DRAPES) ×1 IMPLANT
CUFF TOURN SGL QUICK 18X4 (TOURNIQUET CUFF) IMPLANT
DRAPE HAND 77X146 (DRAPES) ×1 IMPLANT
DRAPE SURG 17X23 STRL (DRAPES) ×1 IMPLANT
DRSG TEGADERM 2-3/8X2-3/4 SM (GAUZE/BANDAGES/DRESSINGS) ×1 IMPLANT
GAUZE SPONGE 4X4 12PLY STRL LF (GAUZE/BANDAGES/DRESSINGS) ×1 IMPLANT
GAUZE XEROFORM 1X8 LF (GAUZE/BANDAGES/DRESSINGS) ×1 IMPLANT
GLOVE BIO SURGEON STRL SZ7 (GLOVE) IMPLANT
GLOVE BIO SURGEON STRL SZ7.5 (GLOVE) ×1 IMPLANT
GLOVE BIOGEL PI IND STRL 7.0 (GLOVE) IMPLANT
GLOVE BIOGEL PI IND STRL 7.5 (GLOVE) ×1 IMPLANT
GOWN STRL REUS W/ TWL LRG LVL3 (GOWN DISPOSABLE) ×2 IMPLANT
GOWN STRL REUS W/ TWL XL LVL3 (GOWN DISPOSABLE) IMPLANT
GOWN STRL SURGICAL XL XLNG (GOWN DISPOSABLE) ×2 IMPLANT
KIT ENDO FORWARD CUT SAFEVIEW (SYSTAGENIX WOUND MANAGEMENT) ×1 IMPLANT
NDL HYPO 25X5/8 SAFETYGLIDE (NEEDLE) IMPLANT
NEEDLE HYPO 25X5/8 SAFETYGLIDE (NEEDLE) ×1 IMPLANT
PACK BASIN DAY SURGERY FS (CUSTOM PROCEDURE TRAY) ×1 IMPLANT
SHEET MEDIUM DRAPE 40X70 STRL (DRAPES) ×1 IMPLANT
SOLUTION ANTFG W/FOAM PAD STRL (MISCELLANEOUS) ×1 IMPLANT
SPIKE FLUID TRANSFER (MISCELLANEOUS) IMPLANT
SPLINT WRIST 10 LEFT UNV (SOFTGOODS) IMPLANT
STOCKINETTE IMPERVIOUS 9X36 MD (GAUZE/BANDAGES/DRESSINGS) ×1 IMPLANT
SUT ETHILON 4 0 PS 2 18 (SUTURE) ×1 IMPLANT
SYR BULB EAR ULCER 3OZ GRN STR (SYRINGE) ×2 IMPLANT
SYR CONTROL 10ML LL (SYRINGE) ×1 IMPLANT
TOWEL GREEN STERILE FF (TOWEL DISPOSABLE) ×2 IMPLANT
UNDERPAD 30X36 HEAVY ABSORB (UNDERPADS AND DIAPERS) ×1 IMPLANT

## 2024-01-09 NOTE — Telephone Encounter (Signed)
 Patient called and said that she needs to change her medication to Oxycodone  and not hydrocodone . Hydrocodone  doesn't work for her. [phone]

## 2024-01-09 NOTE — Anesthesia Procedure Notes (Signed)
 Procedure Name: LMA Insertion Date/Time: 01/09/2024 7:38 AM  Performed by: Debarah Chiquita LABOR, CRNAPre-anesthesia Checklist: Patient identified, Emergency Drugs available, Suction available and Patient being monitored Patient Re-evaluated:Patient Re-evaluated prior to induction Oxygen  Delivery Method: Circle system utilized Preoxygenation: Pre-oxygenation with 100% oxygen  Induction Type: IV induction Ventilation: Mask ventilation without difficulty LMA: LMA inserted LMA Size: 4.0 Number of attempts: 1 Airway Equipment and Method: Bite block Placement Confirmation: positive ETCO2 Tube secured with: Tape Dental Injury: Teeth and Oropharynx as per pre-operative assessment

## 2024-01-09 NOTE — Telephone Encounter (Signed)
New RX called in

## 2024-01-09 NOTE — Anesthesia Postprocedure Evaluation (Signed)
 Anesthesia Post Note  Patient: Angel Price  Procedure(s) Performed: RELEASE, CARPAL TUNNEL, ENDOSCOPIC (Left: Wrist)     Patient location during evaluation: PACU Anesthesia Type: General Level of consciousness: awake Pain management: pain level controlled Vital Signs Assessment: post-procedure vital signs reviewed and stable Respiratory status: spontaneous breathing, nonlabored ventilation and respiratory function stable Cardiovascular status: blood pressure returned to baseline and stable Postop Assessment: no apparent nausea or vomiting Anesthetic complications: no   No notable events documented.  Last Vitals:  Vitals:   01/09/24 0809 01/09/24 0830  BP: 119/78 118/83  Pulse: 78 69  Resp: 19 16  Temp: 36.6 C   SpO2: 100% 99%    Last Pain:  Vitals:   01/09/24 0852  TempSrc:   PainSc: 5                  [name] P Zeshan Sena

## 2024-01-09 NOTE — Transfer of Care (Signed)
 Immediate Anesthesia Transfer of Care Note  Patient: Angel Price  Procedure(s) Performed: RELEASE, CARPAL TUNNEL, ENDOSCOPIC (Left: Wrist)  Patient Location: PACU  Anesthesia Type:General  Level of Consciousness: awake, alert , and oriented  Airway & Oxygen  Therapy: Patient Spontanous Breathing and Patient connected to face mask oxygen   Post-op Assessment: Report given to RN and Post -op Vital signs reviewed and stable  Post vital signs: Reviewed and stable  Last Vitals:  Vitals Value Taken Time  BP 119/78 01/09/24 08:09  Temp    Pulse 90 01/09/24 08:12  Resp 22 01/09/24 08:12  SpO2 100 % 01/09/24 08:12  Vitals shown include unfiled device data.  Last Pain:  Vitals:   01/09/24 0618  TempSrc: Temporal  PainSc: 7       Patients Stated Pain Goal: 3 (01/09/24 0618)  Complications: No notable events documented.

## 2024-01-09 NOTE — Anesthesia Preprocedure Evaluation (Addendum)
 Anesthesia Evaluation  Patient identified by MRN, date of birth, ID band Patient awake    Reviewed: Allergy & Precautions, NPO status , Patient's Chart, lab work & pertinent test results  Airway Mallampati: II  TM Distance: >3 FB Neck ROM: Full    Dental  (+) Missing   Pulmonary Current Smoker and Patient abstained from smoking.   Pulmonary exam normal        Cardiovascular hypertension, Pt. on medications and Pt. on home beta blockers + CAD  Normal cardiovascular exam+ dysrhythmias Atrial Fibrillation      Neuro/Psych  Headaches PSYCHIATRIC DISORDERS Anxiety Depression Bipolar Disorder   CVA, Residual Symptoms    GI/Hepatic hiatal hernia,GERD  Medicated and Controlled,,(+)     substance abuse    Endo/Other    Renal/GU      Musculoskeletal  (+)  narcotic dependent  Abdominal   Peds  Hematology  (+) Blood dyscrasia, anemia   Anesthesia Other Findings LEFT CARPAL TUNNEL SYNDROME  Reproductive/Obstetrics                              Anesthesia Physical Anesthesia Plan  ASA: 3  Anesthesia Plan: General   Post-op Pain Management:    Induction: Intravenous  PONV Risk Score and Plan: 2 and Ondansetron , Dexamethasone , Midazolam  and Treatment may vary due to age or medical condition  Airway Management Planned: LMA  Additional Equipment:   Intra-op Plan:   Post-operative Plan: Extubation in OR  Informed Consent: I have reviewed the patients History and Physical, chart, labs and discussed the procedure including the risks, benefits and alternatives for the proposed anesthesia with the patient or authorized representative who has indicated his/her understanding and acceptance.     Dental advisory given  Plan Discussed with: CRNA  Anesthesia Plan Comments:          Anesthesia Quick Evaluation

## 2024-01-09 NOTE — Op Note (Signed)
 NAME: Angel Price MEDICAL RECORD NO: [id number] DATE OF BIRTH: [birth year] FACILITY: [facility] LOCATION: [facility] PHYSICIAN: GILDARDO ALDERTON, MD   OPERATIVE REPORT   DATE OF PROCEDURE: 01/09/24    PREOPERATIVE DIAGNOSIS: Left carpal tunnel syndrome   POSTOPERATIVE DIAGNOSIS: Left carpal tunnel syndrome   PROCEDURE: Left endoscopic carpal tunnel release   SURGEON:  GILDARDO ALDERTON, M.D.   ASSISTANT: Joesph Hooks, OPA   ANESTHESIA:  General   INTRAVENOUS FLUIDS:  Per anesthesia flow sheet.   ESTIMATED BLOOD LOSS:  Minimal.   COMPLICATIONS:  None.   SPECIMENS:  none   TOURNIQUET TIME:    Total Tourniquet Time Documented: Upper Arm (Left) - 5 minutes Total: Upper Arm (Left) - 5 minutes    DISPOSITION:  Stable to PACU.   INDICATIONS: 58 year old female with clinical and electrodiagnostic evidence of left-sided carpal tunnel syndrome refractory to conservative care.  Patient was indicated for left open versus endoscopic carpal tunnel release.  Patient elected to have surgery in the form of endoscopic carpal tunnel release after discussion regarding risks and benefits.  Risks and benefits of surgery were discussed including the risks of infection, bleeding, scarring, stiffness, nerve injury, vascular injury, tendon injury, need for subsequent operation, persistent, recurrence.  She voiced understanding of these risks and elected to proceed.  OPERATIVE COURSE: Patient was seen and identified in the preoperative area and marked appropriately.  Surgical consent had been signed. Preoperative IV antibiotic prophylaxis was given. She was transferred to the operating room and placed in supine position with the Left upper extremity on an arm board.  General anesthesia was induced by the anesthesiologist.  Left upper extremity was prepped and draped in normal sterile orthopedic fashion.  A surgical pause was performed between the surgeons, anesthesia, and operating room  staff and all were in agreement as to the patient, procedure, and site of procedure.  Tourniquet was placed and padded appropriately to the left upper arm.  The arm was exsanguinated and the tourniquet was inflated to 250 mmHg.  A 1.5 cm skin incision was designed transversely proximal to the wrist flexion crease.  This incision was carried down through the subcutaneous tissues and through the forearm fascia.  A synovial elevator was introduced to identify the carpal tunnel space.  Sequential dilators were then used to open the carpal tunnel.  The cannula from the SafeView system was introduced in antegrade fashion into the carpal tunnel, and a standard wrist endoscope was used to visualize the undersurface of the transverse carpal ligament.  A probe and a rasp were used to delineate the distal edge of the transverse carpal ligament and to clear the underlying synovial tissues.  At this juncture, a forward cutting blade was introduced in an antegrade fashion was used to divide the transverse carpal ligament in its entirety.  Care was taken to ensure complete division of the structure by probing the resultant defect.  The endoscopic instruments were then removed.  At this point in the procedure, the median nerve was identified at the wrist flexion crease.  The distal end of the forearm fascia was released using tenotomy scissors with particular care taken to avoid injury to the palmar cutaneous branch of the median nerve.  The median nerve appeared completely decompressed in both the palm and distal forearm.  The wound was copiously irrigated, the tourniquet was deflated and hemostasis was achieved with bipolar electrocautery.  Tourniquet time was 5 minutes.  The wound was closed with 4-0 nylon suture in vertical  mattress fashion.  Sterile dressings were applied.  Patient was subsequently awoken from anesthesia and transported to the postoperative unit in stable condition.   Post-operative plan: The patient  will recover in the post-anesthesia care unit and then be discharged home.  The patient will be non weight bearing on the left upper extremity in a soft dressing.   I will see the patient back in the office in 2 weeks for postoperative followup.  Arrangements have been made for occupational therapy as well postoperatively.  Elizer Bostic, MD Electronically signed, 01/09/24

## 2024-01-09 NOTE — Discharge Instructions (Addendum)
    Hand Surgery Postop Instructions    Dressings: Maintain postoperative dressing for 5 days.   After 5 days, it is okay to unwrap postoperative dressing and apply Band-Aid or rewrap.   Keep operative site clean and dry until orthopedic follow-up.  Wound Care: Keep your hand elevated above the level of your heart.  Do not allow it to dangle by your side. Moving your fingers is advised to stimulate circulation but will depend on the site of your surgery.  If you have a splint applied, your doctor will advise you regarding movement.  Activity: Do not drive or operate machinery until clearance given from physician. No heavy lifting with operative extremity.  Diet:  Drink liquids today or eat a light diet.  You may resume a regular diet tomorrow.    General expectations: Pain for two to three days. Take prescribed medication if given, transition to over-the-counter medication as quickly as possible. Fingers may become slightly swollen.  Call your doctor if any of the following occur: Severe pain not relieved by pain medication. Elevated temperature. Dressing soaked with blood. Inability to move fingers. White or bluish color to fingers.   Per [facility] clinic policy, our goal is ensure optimal postoperative pain control with a multimodal pain management strategy. For all [facility] patients, our goal is to wean post-operative narcotic medications by 6 weeks post-operatively. If this is not possible due to utilization of pain medication prior to surgery, your [facility] doctor will support your acute post-operative pain control for the first 6 weeks postoperatively, with a plan to transition you back to your primary pain team following that. Maralee will work to ensure a Therapist, occupational.  Anshul Afton Alderton, M.D. Hand Surgery [facility]   Post Anesthesia Home Care Instructions  Activity: Get plenty of rest for the remainder of the day. A responsible individual  must stay with you for 24 hours following the procedure.  For the next 24 hours, DO NOT: -Drive a car -Advertising copywriter -Drink alcoholic beverages -Take any medication unless instructed by your physician -Make any legal decisions or sign important papers.  Meals: Start with liquid foods such as gelatin or soup. Progress to regular foods as tolerated. Avoid greasy, spicy, heavy foods. If nausea and/or vomiting occur, drink only clear liquids until the nausea and/or vomiting subsides. Call your physician if vomiting continues.  Special Instructions/Symptoms: Your throat may feel dry or sore from the anesthesia or the breathing tube placed in your throat during surgery. If this causes discomfort, gargle with warm salt water . The discomfort should disappear within 24 hours.  If you had a scopolamine  patch placed behind your ear for the management of post- operative nausea and/or vomiting:  1. The medication in the patch is effective for 72 hours, after which it should be removed.  Wrap patch in a tissue and discard in the trash. Wash hands thoroughly with soap and water . 2. You may remove the patch earlier than 72 hours if you experience unpleasant side effects which may include dry mouth, dizziness or visual disturbances. 3. Avoid touching the patch. Wash your hands with soap and water  after contact with the patch.     No tylenol  until after 1pm

## 2024-01-09 NOTE — H&P (Signed)
 @LOGODEPT @  Angel Price - 58 y.o. female MRN [id number]  Date of birth: 1965-07-31   HAND SURGERY H&P UPDATE   HPI: Patient is a 58 y.o. female who presents with ongoing left carpal tunnel syndrome.  Patient denies any changes to their medical history or new systemic symptoms today.    Past Medical History:  Diagnosis Date   Abscess    soft tissue   Adrenal mass    Alcohol abuse    Anemia    Anxiety    Blood transfusion without reported diagnosis    Chronic abdominal pain    Chronic wound infection of abdomen    Colon polyp    colonoscopy 04/2014   Coronary artery disease    Mild non obstr CAD   Depression    Discoid lupus erythematosus    Diverticulosis    colonoscopy 04/2014 moderat pan colonic   Drug-seeking behavior    Gastritis    EGD 05/2014   Gastroparesis 01/2014   GERD (gastroesophageal reflux disease)    Hemorrhoid    internal large   Hiatal hernia    History of Billroth II operation    Hypertension    Lung nodule    CT 02/2014 needs repeat 1 month   Lung nodule < 6cm on CT 04/25/2014   Lupus    Malingering    Migraine    Nausea and vomiting    chronic, recurrent   Pancreatitis    Schatzki's ring    patent per EGD 04/2014   Sickle cell trait    Stroke [facility])    Suicide attempt (HCC)    Thyroid  disease 2000   overactive, radiation   Past Surgical History:  Procedure Laterality Date   ABDOMINAL HYSTERECTOMY  2013   [place]   ABDOMINAL HYSTERECTOMY     ABDOMINAL SURGERY     ADRENALECTOMY Right    AGILE CAPSULE N/A 01/05/2015   Procedure: AGILE CAPSULE;  Surgeon: Lamar CHRISTELLA Hollingshead, MD;  [facility];  Service: Endoscopy;  Laterality: N/A;  0700   BALLOON DILATION N/A 10/01/2021   Procedure: BALLOON DILATION;  Surgeon: Cindie Carlin POUR, DO;  [facility];  Service: Endoscopy;  Laterality: N/A;   Billroth II procedure      [place], first 2000, 2005/2006.   bilroth 2     BIOPSY  05/20/2013   Procedure: BIOPSIES OF  ASCENDING AND SIGMOID COLON;  Surgeon: Lamar CHRISTELLA Hollingshead, MD;  Location: [facility];  Service: Endoscopy;;   BIOPSY  04/26/2014   Procedure: BIOPSIES;  Surgeon: Margo LITTIE Haddock, MD;  Location: [facility];  Service: Endoscopy;;   BIOPSY  12/24/2018   Procedure: BIOPSY;  Surgeon: Hollingshead Lamar CHRISTELLA, MD;  [facility];  Service: Endoscopy;;  colon   CHOLECYSTECTOMY     COLONOSCOPY     in [place]   COLONOSCOPY N/A 12/16/2023   Procedure: COLONOSCOPY;  Surgeon: Cindie Carlin POUR, DO;  [facility];  Service: Endoscopy;  Laterality: N/A;  145pm, ok rm 1   COLONOSCOPY WITH PROPOFOL  N/A 05/20/2013   Dr.Rourk- inadequate prep, normal appearing rectum, grossly normal colon aside from pancolonic diverticula, normal terminal ileum bx= unremarkable colonic mucosa. Due for early interval 2016.    COLONOSCOPY WITH PROPOFOL  N/A 04/26/2014   DOQ:wnmfjo ileum/one colon polyp removed/moderate pan-colonic diverticulosis/large internal hemorrhoids   COLONOSCOPY WITH PROPOFOL  N/A 12/23/2014   Dr.Rourk- minimal internal hemorrhoids, pancolonic diverticulosis   COLONOSCOPY WITH PROPOFOL  N/A 08/23/2016   Procedure: COLONOSCOPY WITH PROPOFOL ;  Surgeon: Harvey Margo CROME, MD;  [facility];  Service: Endoscopy;  Laterality: N/A;   COLONOSCOPY WITH PROPOFOL  N/A 12/24/2018   Pancolonic diverticulosis, normal TI, one 5 mm polyp in rectum (tubular adenoma). Somewhat friable hemorrhagic mucosa in area of IC valve/cecum, possibly related to scope trauma s/p biopsy. Repeat in 7 years.   DEBRIDEMENT OF ABDOMINAL WALL ABSCESS N/A 02/08/2013   Procedure: DEBRIDEMENT OF ABDOMINAL WALL ABSCESS;  Surgeon: Oneil DELENA Budge, MD;  Location: [facility];  Service: General;  Laterality: N/A;   ESOPHAGEAL DILATION N/A 12/16/2023   Procedure: DILATION, ESOPHAGUS;  Surgeon: Cindie Carlin POUR, DO;  [facility];  Service: Endoscopy;  Laterality: N/A;   ESOPHAGOGASTRODUODENOSCOPY N/A 12/16/2023   Procedure: EGD  (ESOPHAGOGASTRODUODENOSCOPY);  Surgeon: Cindie Carlin POUR, DO;  [facility];  Service: Endoscopy;  Laterality: N/A;   ESOPHAGOGASTRODUODENOSCOPY (EGD) WITH PROPOFOL  N/A 05/20/2013   Dr.Rourk- s/p prior gastric surgery with normal esophagus, residual gastric mucosa and patent efferent limb   ESOPHAGOGASTRODUODENOSCOPY (EGD) WITH PROPOFOL  N/A 02/03/2014   Dr. Shaaron:  s/p hemigastrectomy with retained gastric contents. Residual gastric mucosa and efferent limb appeared normal otherwise. Query gastroparesis.    ESOPHAGOGASTRODUODENOSCOPY (EGD) WITH PROPOFOL  N/A 04/26/2014   DOQ:dryjusxp'd ring/small HH/mild non-erosive gasrtitis/normal anastomosis   ESOPHAGOGASTRODUODENOSCOPY (EGD) WITH PROPOFOL  N/A 12/23/2014   Dr.Rourk- s/p prior hemigastrctomy, active oozing from anastomotic suture site, hemostasis achieved   ESOPHAGOGASTRODUODENOSCOPY (EGD) WITH PROPOFOL  N/A 05/23/2016   Dr. 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    ESOPHAGOGASTRODUODENOSCOPY (EGD) WITH PROPOFOL  N/A 05/11/2017   Procedure: ESOPHAGOGASTRODUODENOSCOPY (EGD) WITH PROPOFOL ;  Surgeon: Shaaron Lamar HERO, MD;  [facility];  Service: Endoscopy;  Laterality: N/A;   ESOPHAGOGASTRODUODENOSCOPY (EGD) WITH PROPOFOL  N/A 06/07/2021   Procedure: ESOPHAGOGASTRODUODENOSCOPY (EGD) WITH PROPOFOL ;  Surgeon: Shaaron Lamar HERO, MD;  [facility];  Service: Endoscopy;  Laterality: N/A;  11:45am   ESOPHAGOGASTRODUODENOSCOPY (EGD) WITH PROPOFOL  N/A 10/01/2021   Procedure: ESOPHAGOGASTRODUODENOSCOPY (EGD) WITH PROPOFOL ;  Surgeon: Cindie Carlin POUR, DO;  [facility];  Service: Endoscopy;  Laterality: N/A;  3:00pm   EXCISION OF MESH  08/2019   FLEXIBLE SIGMOIDOSCOPY N/A 12/17/2023   Procedure: KINGSTON SIDE;  Surgeon: Shaaron Lamar HERO, MD;  [facility];  Service: Endoscopy;  Laterality: N/A;   HEMORRHOID  SURGERY N/A 06/18/2017   Procedure: THREE COLUMN EXTENSIVE HEMORRHOIDECTOMY;  Surgeon: Kallie Manuelita BROCKS, MD;  Location: [facility];  Service: General;  Laterality: N/A;   HERNIA REPAIR     INCISION AND DRAINAGE ABSCESS N/A 01/06/2017   Procedure: INCISION AND DRAINAGE ABDOMINAL WALL ABSCESS;  Surgeon: Budge Oneil, MD;  Location: [facility];  Service: General;  Laterality: N/A;   incisional hernial repair  09/05/2021   [place]   LEFT HEART CATH AND CORONARY ANGIOGRAPHY N/A 05/09/2020   Procedure: LEFT HEART CATH AND CORONARY ANGIOGRAPHY;  Surgeon: Elmira Newman PARAS, MD;  [facility];  Service: Cardiovascular;  Laterality: N/A;   LEFT HEART CATH AND CORONARY ANGIOGRAPHY N/A 08/22/2023   Procedure: LEFT HEART CATH AND CORONARY ANGIOGRAPHY;  Surgeon: Verlin Lonni BIRCH, MD;  [facility]: [facility];  Service: Cardiovascular;  Laterality: N/A;   POLYPECTOMY  12/24/2018   Procedure: POLYPECTOMY;  Surgeon: Shaaron Lamar HERO, MD;  [facility];  Service: Endoscopy;;  colon   tendon repar Right    wrist   WOUND EXPLORATION Right 06/24/2013  Procedure: exploration of traumatic wound right wrist;  Surgeon: Franky JONELLE Curia, MD;  Location[facility];  Service: Orthopedics;  Laterality: Right;   Social History   Socioeconomic History   Marital status: Divorced    Spouse name: Not on file   Number of children: 4   Years of education: 10   Highest education level: Not on file  Occupational History   Not on file  Tobacco Use   Smoking status: Every Day    Current packs/day: 0.25    Average packs/day: 0.3 packs/day for 35.0 years (8.8 ttl pk-yrs)    Types: Cigarettes    Passive exposure: Past   Smokeless tobacco: Never  Vaping Use   Vaping status: Never Used  Substance and Sexual Activity   Alcohol use: Not Currently    Comment: quit in 01/2023, previously heavy   Drug use: Not Currently    Types: Cocaine    Comment: no cocaine in4  years per patient (08/20/21)    Sexual activity: Not Currently    Birth control/protection: Surgical  Other Topics Concern   Not on file  Social History Narrative   Caffeine- tea occass   Social Drivers of Health   Financial Resource Strain: Low Risk  (09/11/2023)   Received from [facility]   Overall Financial Resource Strain (CARDIA)    Difficulty of Paying Living Expenses: Not hard at all  Food Insecurity: No Food Insecurity (12/17/2023)   Hunger Vital Sign    Worried About Running Out of Food in the Last Year: Never true    Ran Out of Food in the Last Year: Never true  Transportation Needs: No Transportation Needs (12/17/2023)   PRAPARE - Administrator, Civil Service (Medical): No    Lack of Transportation (Non-Medical): No  Physical Activity: Unknown (09/11/2023)   Received from [facility]   Exercise Vital Sign    On average, how many days per week do you engage in moderate to strenuous exercise (like a brisk walk)?: 0 days    Minutes of Exercise per Session: Not on file  Stress: No Stress Concern Present (09/11/2023)   Received from [facility] - Occupational Stress Questionnaire    Feeling of Stress : Only a little  Social Connections: Socially Integrated (09/11/2023)   Received from [facility]   Social Network    How would you rate your social network (family, work, friends)?: Good participation with social networks   Family History  Problem Relation Age of Onset   Drug abuse Mother    Lung cancer Father    Cancer Father        mets   Drug abuse Sister    Drug abuse Brother    Breast cancer Maternal Aunt    Bipolar disorder Maternal Aunt    Drug abuse Maternal Aunt    Colon cancer Maternal Grandmother        late 65s, early 84s   Bipolar disorder Paternal Grandfather    Brain cancer Son    Schizophrenia Son    Cancer Son        brain   Bipolar disorder Cousin    Liver disease Neg Hx    - negative except otherwise stated in the  family history section Allergies  Allergen Reactions   Metoclopramide  Anaphylaxis and Other (See Comments)    Reglan  EPS symptoms  Jerking movements   Codeine Hives   Prior to Admission medications   Medication  Sig Start Date End Date Taking? Authorizing Provider  amLODipine  (NORVASC ) 10 MG tablet Take 10 mg by mouth daily. 12/22/23  Yes [provider]  aspirin  EC 81 MG tablet Take 1 tablet (81 mg total) by mouth daily with breakfast. 12/21/23  Yes Emokpae, Courage, MD  atenolol  (TENORMIN ) 50 MG tablet Take 1 tablet (50 mg total) by mouth daily. 12/20/23  Yes Emokpae, Courage, MD  Cholecalciferol  50 MCG (2000 UT) CAPS Take 1 tablet by mouth daily.   Yes [provider]  Cyanocobalamin  (B-12) 1000 MCG SUBL Place 1,000 mcg under the tongue daily. 11/26/23  Yes Ezzard Sonny RAMAN, PA-C  DULoxetine  (CYMBALTA ) 30 MG capsule Take 30 mg by mouth 2 (two) times daily. 08/14/21  Yes [provider]  gabapentin  (NEURONTIN ) 100 MG capsule Take 100 mg by mouth 3 (three) times daily. 03/03/23  Yes [provider]  HYDROcodone -acetaminophen  (NORCO/VICODIN) 5-325 MG tablet Take 1 tablet by mouth 2 (two) times daily.   Yes [provider]  hydroxychloroquine  (PLAQUENIL ) 200 MG tablet Take 1.5 tablets (300 mg total) by mouth daily. 11/03/23  Yes Rice, Lonni ORN, MD  mirtazapine  (REMERON ) 15 MG tablet Take 15 mg by mouth at bedtime as needed (for sleep). 08/10/21  Yes [provider]  pantoprazole  (PROTONIX ) 40 MG tablet Take 1 tablet (40 mg total) by mouth daily before breakfast. 12/18/23  Yes Emokpae, Courage, MD  promethazine  (PHENERGAN ) 25 MG tablet Take 0.5 tablets (12.5 mg total) by mouth every 6 (six) hours as needed for nausea or vomiting. 12/18/23  Yes Emokpae, Courage, MD  thiamine  (VITAMIN B1) 100 MG tablet Take 100 mg by mouth daily. 04/26/23  Yes [provider]  tiZANidine (ZANAFLEX) 2 MG tablet Take 2 mg by mouth at bedtime. 10/17/23  Yes  [provider]  nitroGLYCERIN  (NITROSTAT ) 0.4 MG SL tablet Place 1 tablet (0.4 mg total) under the tongue every 5 (five) minutes as needed for chest pain. 12/29/19 01/06/24  Patwardhan, Newman PARAS, MD   No results found. - Positive ROS: All other systems have been reviewed and were otherwise negative with the exception of those mentioned in the HPI and as above.  Physical Exam: General: No acute distress, resting comfortably Cardiovascular: BUE warm and well perfused, normal rate Respiratory: Normal WOB on RA Skin: Warm and dry Neurologic: Sensation intact distally Psychiatric: Patient is at baseline mood and affect    Skin and Muscle: No significant skin changes are apparent to upper extremities.    Range of Motion and Palpation Tests: Mobility is full about the elbows with flexion and extensionForearm supination and pronation are 85/85 bilaterally.  Wrist flexion/extension is 75/65 bilaterally.  Digital flexion and extension are full.  Thumb opposition is full to the base of the small fingers bilaterally.     No cords or nodules are palpated.  No triggering is observed.     Neurologic, Vascular, Motor: Sensation is diminished to light touch in the bilateral median nerve distribution.    Thenar atrophy: Mild bilateral Tinel sign: Significantly positive bilateral carpal tunnel Carpal tunnel compression: Positive bilateral Phalen test: Positive bilateral   Motor bilateral hand FPL: 5/5 Index FDP: 5/5 APB: 4+/5     Fingers pink and well perfused.  Capillary refill is brisk.   Assessment/Plan: OR today for left endoscopic carpal tunnel release. We again reviewed the risks of surgery which include bleeding, infection, damage to neurovascular structures, persistent symptoms, need for additional surgery, conversion to open surgery.  Informed consent was signed.  All questions were answered.   [facility], Hand Surgery

## 2024-01-10 ENCOUNTER — Encounter (HOSPITAL_BASED_OUTPATIENT_CLINIC_OR_DEPARTMENT_OTHER): Payer: Self-pay | Admitting: Orthopedic Surgery

## 2024-01-15 ENCOUNTER — Ambulatory Visit

## 2024-01-15 DIAGNOSIS — G43109 Migraine with aura, not intractable, without status migrainosus: Secondary | ICD-10-CM | POA: Diagnosis not present

## 2024-01-15 DIAGNOSIS — R208 Other disturbances of skin sensation: Secondary | ICD-10-CM | POA: Insufficient documentation

## 2024-01-15 DIAGNOSIS — R278 Other lack of coordination: Secondary | ICD-10-CM | POA: Insufficient documentation

## 2024-01-15 DIAGNOSIS — Z9889 Other specified postprocedural states: Secondary | ICD-10-CM | POA: Insufficient documentation

## 2024-01-15 DIAGNOSIS — M25532 Pain in left wrist: Secondary | ICD-10-CM | POA: Insufficient documentation

## 2024-01-15 DIAGNOSIS — R29898 Other symptoms and signs involving the musculoskeletal system: Secondary | ICD-10-CM | POA: Insufficient documentation

## 2024-01-15 DIAGNOSIS — R29818 Other symptoms and signs involving the nervous system: Secondary | ICD-10-CM | POA: Insufficient documentation

## 2024-01-15 DIAGNOSIS — I639 Cerebral infarction, unspecified: Secondary | ICD-10-CM | POA: Diagnosis not present

## 2024-01-15 DIAGNOSIS — G5603 Carpal tunnel syndrome, bilateral upper limbs: Secondary | ICD-10-CM | POA: Insufficient documentation

## 2024-01-15 DIAGNOSIS — M25531 Pain in right wrist: Secondary | ICD-10-CM | POA: Insufficient documentation

## 2024-01-15 NOTE — Patient Instructions (Signed)
 SABRA

## 2024-01-15 NOTE — Evaluation (Deleted)
 OUTPATIENT OCCUPATIONAL THERAPY ORTHO EVALUATION  Patient Name: Angel Price MRN: [id number] DOB:1965/07/13, 58 y.o., female Today's Date: 01/15/2024  PCP: Carlette Benita Area, MD REFERRING PROVIDER: Arlinda Buster, MD  END OF SESSION:  OT End of Session - 01/15/24 1605     Visit Number 1    Number of Visits 21   including eval   Date for Recertification  04/16/24    Authorization Type [employer] Dual    Authorization Time Period NO AUTH REQUIRED    Authorization - Visit Number 1    Authorization - Number of Visits 21   including eval   Progress Note Due on Visit 10    OT Start Time 1230    OT Stop Time 1315    OT Time Calculation (min) 45 min    Equipment Utilized During Treatment testing materials    Activity Tolerance Patient tolerated treatment well;Patient limited by pain    Behavior During Therapy [facility] for tasks assessed/performed          Past Medical History:  Diagnosis Date   Abscess    soft tissue   Adrenal mass    Alcohol abuse    Anemia    Anxiety    Blood transfusion without reported diagnosis    Chronic abdominal pain    Chronic wound infection of abdomen    Colon polyp    colonoscopy 04/2014   Coronary artery disease    Mild non obstr CAD   Depression    Discoid lupus erythematosus    Diverticulosis    colonoscopy 04/2014 moderat pan colonic   Drug-seeking behavior    Gastritis    EGD 05/2014   Gastroparesis 01/2014   GERD (gastroesophageal reflux disease)    Hemorrhoid    internal large   Hiatal hernia    History of Billroth II operation    Hypertension    Lung nodule    CT 02/2014 needs repeat 1 month   Lung nodule < 6cm on CT 04/25/2014   Lupus    Malingering    Migraine    Nausea and vomiting    chronic, recurrent   Pancreatitis    Schatzki's ring    patent per EGD 04/2014   Sickle cell trait    Stroke [facility])    Suicide attempt (HCC)    Thyroid  disease 2000   overactive, radiation   Past Surgical History:  Procedure  Laterality Date   ABDOMINAL HYSTERECTOMY  2013   [place]   ABDOMINAL HYSTERECTOMY     ABDOMINAL SURGERY     ADRENALECTOMY Right    AGILE CAPSULE N/A 01/05/2015   Procedure: AGILE CAPSULE;  Surgeon: Lamar CHRISTELLA Hollingshead, MD;  [facility];  Service: Endoscopy;  Laterality: N/A;  0700   BALLOON DILATION N/A 10/01/2021   Procedure: BALLOON DILATION;  Surgeon: Cindie Carlin POUR, DO;  [facility];  Service: Endoscopy;  Laterality: N/A;   Billroth II procedure      [place], first 2000, 2005/2006.   bilroth 2     BIOPSY  05/20/2013   Procedure: BIOPSIES OF ASCENDING AND SIGMOID COLON;  Surgeon: Lamar CHRISTELLA Hollingshead, MD;  Location: [facility];  Service: Endoscopy;;   BIOPSY  04/26/2014   Procedure: BIOPSIES;  Surgeon: Margo LITTIE Haddock, MD;  Location: [facility];  Service: Endoscopy;;   BIOPSY  12/24/2018   Procedure: BIOPSY;  Surgeon: Hollingshead Lamar CHRISTELLA, MD;  [facility];  Service: Endoscopy;;  colon   CARPAL TUNNEL RELEASE Left  01/09/2024   Procedure: RELEASE, CARPAL TUNNEL, ENDOSCOPIC;  Surgeon: Arlinda Buster, MD;  Location: [facility];  Service: Orthopedics;  Laterality: Left;   CHOLECYSTECTOMY     COLONOSCOPY     in [place]   COLONOSCOPY N/A 12/16/2023   Procedure: COLONOSCOPY;  Surgeon: Cindie Carlin POUR, DO;  [facility];  Service: Endoscopy;  Laterality: N/A;  145pm, ok rm 1   COLONOSCOPY WITH PROPOFOL  N/A 05/20/2013   Dr.Rourk- inadequate prep, normal appearing rectum, grossly normal colon aside from pancolonic diverticula, normal terminal ileum bx= unremarkable colonic mucosa. Due for early interval 2016.    COLONOSCOPY WITH PROPOFOL  N/A 04/26/2014   DOQ:wnmfjo ileum/one colon polyp removed/moderate pan-colonic diverticulosis/large internal hemorrhoids   COLONOSCOPY WITH PROPOFOL  N/A 12/23/2014   Dr.Rourk- minimal internal hemorrhoids, pancolonic diverticulosis   COLONOSCOPY WITH PROPOFOL  N/A 08/23/2016   Procedure: COLONOSCOPY WITH  PROPOFOL ;  Surgeon: Harvey Margo CROME, MD;  [facility];  Service: Endoscopy;  Laterality: N/A;   COLONOSCOPY WITH PROPOFOL  N/A 12/24/2018   Pancolonic diverticulosis, normal TI, one 5 mm polyp in rectum (tubular adenoma). Somewhat friable hemorrhagic mucosa in area of IC valve/cecum, possibly related to scope trauma s/p biopsy. Repeat in 7 years.   DEBRIDEMENT OF ABDOMINAL WALL ABSCESS N/A 02/08/2013   Procedure: DEBRIDEMENT OF ABDOMINAL WALL ABSCESS;  Surgeon: Oneil DELENA Budge, MD;  Location: [facility];  Service: General;  Laterality: N/A;   ESOPHAGEAL DILATION N/A 12/16/2023   Procedure: DILATION, ESOPHAGUS;  Surgeon: Cindie Carlin POUR, DO;  [facility];  Service: Endoscopy;  Laterality: N/A;   ESOPHAGOGASTRODUODENOSCOPY N/A 12/16/2023   Procedure: EGD (ESOPHAGOGASTRODUODENOSCOPY);  Surgeon: Cindie Carlin POUR, DO;  [facility];  Service: Endoscopy;  Laterality: N/A;   ESOPHAGOGASTRODUODENOSCOPY (EGD) WITH PROPOFOL  N/A 05/20/2013   Dr.Rourk- s/p prior gastric surgery with normal esophagus, residual gastric mucosa and patent efferent limb   ESOPHAGOGASTRODUODENOSCOPY (EGD) WITH PROPOFOL  N/A 02/03/2014   Dr. Shaaron:  s/p hemigastrectomy with retained gastric contents. Residual gastric mucosa and efferent limb appeared normal otherwise. Query gastroparesis.    ESOPHAGOGASTRODUODENOSCOPY (EGD) WITH PROPOFOL  N/A 04/26/2014   DOQ:dryjusxp'd ring/small HH/mild non-erosive gasrtitis/normal anastomosis   ESOPHAGOGASTRODUODENOSCOPY (EGD) WITH PROPOFOL  N/A 12/23/2014   Dr.Rourk- s/p prior hemigastrctomy, active oozing from anastomotic suture site, hemostasis achieved   ESOPHAGOGASTRODUODENOSCOPY (EGD) WITH PROPOFOL  N/A 05/23/2016   Dr. 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    ESOPHAGOGASTRODUODENOSCOPY (EGD) WITH PROPOFOL  N/A 05/11/2017   Procedure: ESOPHAGOGASTRODUODENOSCOPY (EGD) WITH  PROPOFOL ;  Surgeon: Shaaron Lamar HERO, MD;  [facility];  Service: Endoscopy;  Laterality: N/A;   ESOPHAGOGASTRODUODENOSCOPY (EGD) WITH PROPOFOL  N/A 06/07/2021   Procedure: ESOPHAGOGASTRODUODENOSCOPY (EGD) WITH PROPOFOL ;  Surgeon: Shaaron Lamar HERO, MD;  [facility];  Service: Endoscopy;  Laterality: N/A;  11:45am   ESOPHAGOGASTRODUODENOSCOPY (EGD) WITH PROPOFOL  N/A 10/01/2021   Procedure: ESOPHAGOGASTRODUODENOSCOPY (EGD) WITH PROPOFOL ;  Surgeon: Cindie Carlin POUR, DO;  [facility];  Service: Endoscopy;  Laterality: N/A;  3:00pm   EXCISION OF MESH  08/2019   FLEXIBLE SIGMOIDOSCOPY N/A 12/17/2023   Procedure: KINGSTON SIDE;  Surgeon: Shaaron Lamar HERO, MD;  [facility];  Service: Endoscopy;  Laterality: N/A;   HEMORRHOID SURGERY N/A 06/18/2017   Procedure: THREE COLUMN EXTENSIVE HEMORRHOIDECTOMY;  Surgeon: Kallie Manuelita BROCKS, MD;  Location: [facility];  Service: General;  Laterality: N/A;   HERNIA REPAIR     INCISION AND DRAINAGE ABSCESS  N/A 01/06/2017   Procedure: INCISION AND DRAINAGE ABDOMINAL WALL ABSCESS;  Surgeon: Mavis Anes, MD;  Location: [facility];  Service: General;  Laterality: N/A;   incisional hernial repair  09/05/2021   [place]   LEFT HEART CATH AND CORONARY ANGIOGRAPHY N/A 05/09/2020   Procedure: LEFT HEART CATH AND CORONARY ANGIOGRAPHY;  Surgeon: Elmira Newman PARAS, MD;  [facility]: [facility];  Service: Cardiovascular;  Laterality: N/A;   LEFT HEART CATH AND CORONARY ANGIOGRAPHY N/A 08/22/2023   Procedure: LEFT HEART CATH AND CORONARY ANGIOGRAPHY;  Surgeon: Verlin Lonni BIRCH, MD;  Location: [facility];  Service: Cardiovascular;  Laterality: N/A;   POLYPECTOMY  12/24/2018   Procedure: POLYPECTOMY;  Surgeon: Shaaron Lamar HERO, MD;  [facility];  Service: Endoscopy;;  colon   tendon repar Right    wrist   WOUND EXPLORATION Right 06/24/2013   Procedure: exploration of traumatic wound right wrist;   Surgeon: Franky JONELLE Curia, MD;  Location[facility];  Service: Orthopedics;  Laterality: Right;   Patient Active Problem List   Diagnosis Date Noted   Carpal tunnel syndrome of left wrist 01/09/2024   Acute lower GI bleeding 12/16/2023   B12 deficiency 11/26/2023   Iron deficiency 11/26/2023   Chest pain 08/21/2023   Dyslipidemia 08/21/2023   Rectal pressure 08/05/2023   Abdominal bloating 08/05/2023   Anemia 08/05/2023   High risk medication use 05/05/2023   Unilateral primary osteoarthritis, left knee 05/05/2023   Seropositive rheumatoid arthritis (HCC) 05/02/2022   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   Cellulitis 10/06/2021   Paroxysmal atrial fibrillation (HCC) 10/06/2021   Abdominal fluid collection 10/06/2021   Constipation 09/14/2021   Ventral hernia 05/15/2021   Dysphagia 05/15/2021   Cigarette nicotine  dependence without complication 09/12/2020   Continuous dependence on cigarette smoking 08/22/2020   Mixed hyperlipidemia 04/27/2020   Recurrent incisional hernia 02/03/2020   Bilateral knee pain 12/29/2019   Coronary artery disease of native artery of native heart with stable angina pectoris 06/25/2019   Acute respiratory failure with hypoxia (HCC) 05/20/2019   Essential hypertension 05/20/2019   COVID-19 05/20/2019   Syncope and collapse 05/20/2019   Metabolic acidosis 05/16/2019   Intractable vomiting with nausea 02/23/2019   Cocaine abuse (HCC)    Colitis 11/11/2018   Hemorrhoidal skin tag    Folate deficiency 05/11/2017   Abdominal wall fistula    Cellulitis of abdominal wall 01/02/2017   Acute left hemiparesis (HCC) 12/05/2016   Malingering 12/05/2016   Weakness of left lower extremity 12/02/2016   CVA (cerebral vascular accident) (HCC) 11/23/2016   Bright red rectal bleeding 08/22/2016   Hypotension due to blood loss    Acute GI bleeding 05/24/2016   Dieulafoy lesion of duodenum    History of Billroth II operation    Gastrointestinal hemorrhage  05/22/2016   Absolute anemia    Acute pancreatitis 10/01/2015   Alcohol abuse with intoxication    Left-sided weakness    Psychosomatic factor in physical condition    Upper GI bleed    Diverticulosis of colon with hemorrhage    Chronic wound infection of abdomen 12/22/2014   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   HTN (hypertension) 12/22/2014   Ataxia 11/01/2014   Hemorrhoids 04/26/2014   Lung nodule 04/26/2014   Diverticulosis    Gastritis    Hiatal hernia    Schatzki's ring    Acute blood loss anemia 04/25/2014   Hypokalemia 04/25/2014   Hematemesis with nausea 04/25/2014   Lung nodule < 6cm on CT  04/25/2014   Intractable nausea and vomiting 04/24/2014   Gastroparesis    Abdominal pain 04/01/2014   Gastroenteritis 12/10/2013   Chronic abdominal wound infection 08/16/2013   MDD (major depressive disorder), recurrent episode, severe (HCC) 06/27/2013   Wrist laceration 06/24/2013   Diarrhea 05/07/2013   Rectal bleeding 05/07/2013   Adrenal mass, left 05/07/2013   Post-operative wound abscess 04/19/2013   Abdominal wall abscess 04/18/2013   Essential hypertension, benign 03/30/2013   History of cocaine abuse (HCC) 03/16/2013   History of schizoaffective disorder 03/16/2013   Bipolar disorder (HCC) 03/16/2013   Personality disorder (HCC) 03/16/2013   Tobacco use disorder 03/16/2013   Alcohol abuse 03/16/2013   Palpitations 03/16/2013   Poor vision 03/16/2013   History of gastric bypass 03/16/2013   Status post hysterectomy with oophorectomy 03/16/2013   Hypothyroid 03/16/2013   SLE (systemic lupus erythematosus) (HCC) 03/16/2013    ONSET DATE: 12/25/2023 referral date, 01/09/24 surgery date  REFERRING DIAG: G56.03 (ICD-10-CM) - Bilateral carpal tunnel syndrome Per Dr. Erwin: Needs OT 5-6 days s/p left endoscopic CTR (01/09/24) NO SPLINT THERAPY DIAG:  Other lack of coordination  Pain in left wrist  Pain in right wrist  Other symptoms and signs involving the  musculoskeletal system  Other symptoms and signs involving the nervous system  Other disturbances of skin sensation  S/P carpal tunnel release  Rationale for Evaluation and Treatment: Rehabilitation  REFER TO IHP PGS [phone]  SUBJECTIVE:   SUBJECTIVE STATEMENT: Pt reports only a little pain today. Pt accompanied by: self  PERTINENT HISTORY: Pt presents to OT after 01/09/24 CTR surgery on L hand, also has bilateral CTS. Other PPMH includes: schizoaffective disorder, CAD, HTN, afib, lupus, CVA  PRECAUTIONS: None  RED FLAGS: None   WEIGHT BEARING RESTRICTIONS: s/p CTR L wrist  PAIN:  Are you having pain? Yes: NPRS scale: 3.5/10 Pain location: L wrist  Pain description: achy Aggravating factors: movement Relieving factors: medication and rest  FALLS: Has patient fallen in last 6 months? No  LIVING ENVIRONMENT: Lives with: lives alone Lives in: House/apartment apartment, ground level Stairs: No Has following equipment at home: Vannie - 4 wheeled  PLOF: Has an aide, helps with cleaning and cooking, occasionally helps with dressing, has lupus flare ups and hx of stroke, and aide drives her to appts. 7x/days week for 1.5 hours-2 hours  PATIENT GOALS: Pt reports she loves to cook and reports that her hands are weak and hard to open. Wants to get back to doing the things she was doing before with less help.  NEXT MD VISIT: 01/22/24  OBJECTIVE:  Note: Objective measures were completed at Evaluation unless otherwise noted.  HAND DOMINANCE: Left  ADLs: Overall ADLs: Reports needing more help with dressing, reports difficulty with functional movement of hand  FUNCTIONAL OUTCOME MEASURES: Quick Dash: 84.1% impairment   UPPER EXTREMITY ROM:     Active ROM Right eval Left eval  Shoulder flexion    Shoulder abduction    Shoulder adduction    Shoulder extension    Shoulder internal rotation    Shoulder external rotation    Elbow flexion    Elbow extension    Wrist  flexion    Wrist extension    Wrist ulnar deviation    Wrist radial deviation    Wrist pronation    Wrist supination    (Blank rows = not tested)  Active ROM Right eval Left eval  Thumb MCP (0-60)    Thumb IP (0-80)  Thumb Radial abd/add (0-55)     Thumb Palmar abd/add (0-45)     Thumb Opposition to Small Finger     Index MCP (0-90)     Index PIP (0-100)     Index DIP (0-70)      Long MCP (0-90)      Long PIP (0-100)      Long DIP (0-70)      Ring MCP (0-90)      Ring PIP (0-100)      Ring DIP (0-70)      Little MCP (0-90)      Little PIP (0-100)      Little DIP (0-70)      (Blank rows = not tested)   UPPER EXTREMITY MMT:     MMT Right eval Left eval  Shoulder flexion    Shoulder abduction    Shoulder adduction    Shoulder extension    Shoulder internal rotation    Shoulder external rotation    Middle trapezius    Lower trapezius    Elbow flexion    Elbow extension    Wrist flexion    Wrist extension    Wrist ulnar deviation    Wrist radial deviation    Wrist pronation    Wrist supination    (Blank rows = not tested)  HAND FUNCTION: Grip strength: Right: 7.4 lbs; Left: 5.2 lbs  COORDINATION: 9 Hole Peg test: Right: 36.86 sec; Left: 42.21 sec Box and Blocks:  Right 40 blocks, Left 52 blocks  SENSATION: Light touch: Impaired  100% accuracy L hand, 75% accuracy R hand (unable to report being touched on R index finger)  EDEMA: Pt has reported swelling in B hands, reports it comes and goes.  COGNITION: Overall cognitive status: Within functional limits for tasks assessed   OBSERVATIONS: Pain in B hands and wrists d/t bilateral CTS and recent L CTR.    TREATMENT DATE: 01/15/24                                                                                                                           Pt educated in purpose of OT, goals, and POC. Pt in agreement at this time. Provided handout of stretches and exercises per I[facility] protocol, see Pt  instructions for handout. Urged pt to only stretch as much as tolerated with wrist flexion/extension/deviation stretches, pt verbalized understanding.       PATIENT EDUCATION: Education details: SEE ABOVE Person educated: Patient Education method: Explanation, Demonstration, and Handouts Education comprehension: verbalized understanding, returned demonstration, and needs further education  HOME EXERCISE PROGRAM: 01/15/24: I[facility] Stretches p 15-16  GOALS: Goals reviewed with patient? Yes  SHORT TERM GOALS: Target date: 02/15/24  Pt will be independent with HEP for wrist ROM, hand strength, and coordination Baseline: Initiated  Goal status: INITIAL  2.  Pt will be independent with scar tissue massage and edema mgmt Baseline: To be instructed  Goal status: INITIAL  3.  Pt will  increase L wrist extension by at least 10 degrees for improved functional movement Baseline: 30 degrees Goal status: INITIAL  4.  Pt will increase L wrist flexion by at least 10 degrees for improved functional movement Baseline: 42 degrees Goal status: INITIAL  5.  Pt will be independent with joint protection strategies to manage B CTS and L CTR Baseline: To be educated  Goal status: INITIAL  6.  Pt will increase L radial deviation by 5 degrees Baseline: 7 degrees Goal status: INITIAL  LONG TERM GOALS: Target date: 04/16/24  Patient will demonstrate at least 25% improvement with quick Dash score (reporting 59.1% disability or less) indicating improved functional use of affected extremity.  Baseline: 84.1% impairment Goal status: INITIAL  2.  Pt will explore AE for desired tasks as needed to reduce strain on B wrists Baseline: To be educated  Goal status: INITIAL  3.  Pt will complete light meal prep with reports of pain no more than 3/10 Baseline: Pt currently unable to complete cooking tasks but wants to return to this Goal status: INITIAL  4.  Pt will increase L wrist flexion/extension by 20  degrees  Baseline: 30 degrees extension, 42 degrees flexion Goal status: INITIAL    ASSESSMENT:  CLINICAL IMPRESSION: Patient is a 58 y.o. female who was seen today for occupational therapy evaluation for L CTR and bilateral CTS. Hx includes lupus, CVA. Patient currently presents below baseline level of functioning demonstrating functional deficits and impairments as noted below. Pt would benefit from skilled OT services in the outpatient setting to work on impairments as noted below to help pt return to PLOF as able.     PERFORMANCE DEFICITS: in functional skills including ADLs, IADLs, coordination, dexterity, sensation, edema, strength, pain, Fine motor control, decreased knowledge of use of DME, skin integrity, and UE functional use,  and psychosocial skills including coping strategies, environmental adaptation, habits, and routines and behaviors.   IMPAIRMENTS: are limiting patient from ADLs, IADLs, rest and sleep, and leisure.   COMORBIDITIES: has co-morbidities such as lupus that affects occupational performance. Patient will benefit from skilled OT to address above impairments and improve overall function.  MODIFICATION OR ASSISTANCE TO COMPLETE EVALUATION: Min-Moderate modification of tasks or assist with assess necessary to complete an evaluation.  OT OCCUPATIONAL PROFILE AND HISTORY: Detailed assessment: Review of records and additional review of physical, cognitive, psychosocial history related to current functional performance.  CLINICAL DECISION MAKING: Moderate - several treatment options, min-mod task modification necessary  REHAB POTENTIAL: Good  EVALUATION COMPLEXITY: Moderate      PLAN:  OT FREQUENCY: 1-2x/week  OT DURATION: 12 weeks  PLANNED INTERVENTIONS: 97168 OT Re-evaluation, 97535 self care/ADL training, 02889 therapeutic exercise, 97530 therapeutic activity, 97140 manual therapy, 97035 ultrasound, 97018 paraffin, 02960 fluidotherapy, 97010 moist heat,  97010 cryotherapy, scar mobilization, passive range of motion, energy conservation, coping strategies training, patient/family education, and DME and/or AE instructions  RECOMMENDED OTHER SERVICES: none at this time  CONSULTED AND AGREED WITH PLAN OF CARE: Patient  PLAN FOR NEXT SESSION: scar tissue massage if sutures removed Modalities once sutures are removed assuming no open wounds Median nerve glides    DOS: 01/09/24~01/15/24 (6 DAYS POST SURGERY)  Rocky Dutch, OT 01/15/2024, 4:08 PM

## 2024-01-15 NOTE — Therapy (Signed)
 OUTPATIENT OCCUPATIONAL THERAPY ORTHO EVALUATION  Patient Name: Angel Price MRN: [id number] DOB:1966-01-19, 58 y.o., female Today's Date: 01/15/2024    END OF SESSION:  OT End of Session - 01/15/24 1605     Visit Number 1    Number of Visits 21   including eval   Date for Recertification  04/16/24    Authorization Type [employer] Dual    Authorization Time Period NO AUTH REQUIRED    Authorization - Visit Number 1    Authorization - Number of Visits 21   including eval   Progress Note Due on Visit 10    OT Start Time 1230    OT Stop Time 1315    OT Time Calculation (min) 45 min    Equipment Utilized During Treatment testing materials    Activity Tolerance Patient tolerated treatment well;Patient limited by pain    Behavior During Therapy WFL for tasks assessed/performed         PCP: Carlette Benita Area, MD REFERRING PROVIDER: Arlinda Buster, MD Past Medical History:  Diagnosis Date   Abscess    soft tissue   Adrenal mass    Alcohol abuse    Anemia    Anxiety    Blood transfusion without reported diagnosis    Chronic abdominal pain    Chronic wound infection of abdomen    Colon polyp    colonoscopy 04/2014   Coronary artery disease    Mild non obstr CAD   Depression    Discoid lupus erythematosus    Diverticulosis    colonoscopy 04/2014 moderat pan colonic   Drug-seeking behavior    Gastritis    EGD 05/2014   Gastroparesis 01/2014   GERD (gastroesophageal reflux disease)    Hemorrhoid    internal large   Hiatal hernia    History of Billroth II operation    Hypertension    Lung nodule    CT 02/2014 needs repeat 1 month   Lung nodule < 6cm on CT 04/25/2014   Lupus    Malingering    Migraine    Nausea and vomiting    chronic, recurrent   Pancreatitis    Schatzki's ring    patent per EGD 04/2014   Sickle cell trait    Stroke [facility])    Suicide attempt (HCC)    Thyroid  disease 2000   overactive, radiation   Past Surgical History:  Procedure  Laterality Date   ABDOMINAL HYSTERECTOMY  2013   [place]   ABDOMINAL HYSTERECTOMY     ABDOMINAL SURGERY     ADRENALECTOMY Right    AGILE CAPSULE N/A 01/05/2015   Procedure: AGILE CAPSULE;  Surgeon: Lamar CHRISTELLA Hollingshead, MD;  [facility];  Service: Endoscopy;  Laterality: N/A;  0700   BALLOON DILATION N/A 10/01/2021   Procedure: BALLOON DILATION;  Surgeon: Cindie Carlin POUR, DO;  [facility]: [facility];  Service: Endoscopy;  Laterality: N/A;   Billroth II procedure      [place], first 2000, 2005/2006.   bilroth 2     BIOPSY  05/20/2013   Procedure: BIOPSIES OF ASCENDING AND SIGMOID COLON;  Surgeon: Lamar CHRISTELLA Hollingshead, MD;  Location: [facility];  Service: Endoscopy;;   BIOPSY  04/26/2014   Procedure: BIOPSIES;  Surgeon: Margo LITTIE Haddock, MD;  Location: [facility];  Service: Endoscopy;;   BIOPSY  12/24/2018   Procedure: BIOPSY;  Surgeon: Hollingshead Lamar CHRISTELLA, MD;  [facility];  Service: Endoscopy;;  colon   CARPAL TUNNEL RELEASE Left  01/09/2024   Procedure: RELEASE, CARPAL TUNNEL, ENDOSCOPIC;  Surgeon: Arlinda Buster, MD;  Location: [facility];  Service: Orthopedics;  Laterality: Left;   CHOLECYSTECTOMY     COLONOSCOPY     in [place]   COLONOSCOPY N/A 12/16/2023   Procedure: COLONOSCOPY;  Surgeon: Cindie Carlin POUR, DO;  [facility];  Service: Endoscopy;  Laterality: N/A;  145pm, ok rm 1   COLONOSCOPY WITH PROPOFOL  N/A 05/20/2013   Dr.Rourk- inadequate prep, normal appearing rectum, grossly normal colon aside from pancolonic diverticula, normal terminal ileum bx= unremarkable colonic mucosa. Due for early interval 2016.    COLONOSCOPY WITH PROPOFOL  N/A 04/26/2014   DOQ:wnmfjo ileum/one colon polyp removed/moderate pan-colonic diverticulosis/large internal hemorrhoids   COLONOSCOPY WITH PROPOFOL  N/A 12/23/2014   Dr.Rourk- minimal internal hemorrhoids, pancolonic diverticulosis   COLONOSCOPY WITH PROPOFOL  N/A 08/23/2016   Procedure: COLONOSCOPY WITH  PROPOFOL ;  Surgeon: Harvey Margo CROME, MD;  [facility];  Service: Endoscopy;  Laterality: N/A;   COLONOSCOPY WITH PROPOFOL  N/A 12/24/2018   Pancolonic diverticulosis, normal TI, one 5 mm polyp in rectum (tubular adenoma). Somewhat friable hemorrhagic mucosa in area of IC valve/cecum, possibly related to scope trauma s/p biopsy. Repeat in 7 years.   DEBRIDEMENT OF ABDOMINAL WALL ABSCESS N/A 02/08/2013   Procedure: DEBRIDEMENT OF ABDOMINAL WALL ABSCESS;  Surgeon: Oneil DELENA Budge, MD;  Location: [facility];  Service: General;  Laterality: N/A;   ESOPHAGEAL DILATION N/A 12/16/2023   Procedure: DILATION, ESOPHAGUS;  Surgeon: Cindie Carlin POUR, DO;  [facility];  Service: Endoscopy;  Laterality: N/A;   ESOPHAGOGASTRODUODENOSCOPY N/A 12/16/2023   Procedure: EGD (ESOPHAGOGASTRODUODENOSCOPY);  Surgeon: Cindie Carlin POUR, DO;  [facility];  Service: Endoscopy;  Laterality: N/A;   ESOPHAGOGASTRODUODENOSCOPY (EGD) WITH PROPOFOL  N/A 05/20/2013   Dr.Rourk- s/p prior gastric surgery with normal esophagus, residual gastric mucosa and patent efferent limb   ESOPHAGOGASTRODUODENOSCOPY (EGD) WITH PROPOFOL  N/A 02/03/2014   Dr. Shaaron:  s/p hemigastrectomy with retained gastric contents. Residual gastric mucosa and efferent limb appeared normal otherwise. Query gastroparesis.    ESOPHAGOGASTRODUODENOSCOPY (EGD) WITH PROPOFOL  N/A 04/26/2014   DOQ:dryjusxp'd ring/small HH/mild non-erosive gasrtitis/normal anastomosis   ESOPHAGOGASTRODUODENOSCOPY (EGD) WITH PROPOFOL  N/A 12/23/2014   Dr.Rourk- s/p prior hemigastrctomy, active oozing from anastomotic suture site, hemostasis achieved   ESOPHAGOGASTRODUODENOSCOPY (EGD) WITH PROPOFOL  N/A 05/23/2016   Dr. 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    ESOPHAGOGASTRODUODENOSCOPY (EGD) WITH PROPOFOL  N/A 05/11/2017   Procedure: ESOPHAGOGASTRODUODENOSCOPY (EGD) WITH  PROPOFOL ;  Surgeon: Shaaron Lamar HERO, MD;  [facility];  Service: Endoscopy;  Laterality: N/A;   ESOPHAGOGASTRODUODENOSCOPY (EGD) WITH PROPOFOL  N/A 06/07/2021   Procedure: ESOPHAGOGASTRODUODENOSCOPY (EGD) WITH PROPOFOL ;  Surgeon: Shaaron Lamar HERO, MD;  [facility];  Service: Endoscopy;  Laterality: N/A;  11:45am   ESOPHAGOGASTRODUODENOSCOPY (EGD) WITH PROPOFOL  N/A 10/01/2021   Procedure: ESOPHAGOGASTRODUODENOSCOPY (EGD) WITH PROPOFOL ;  Surgeon: Cindie Carlin POUR, DO;  [facility];  Service: Endoscopy;  Laterality: N/A;  3:00pm   EXCISION OF MESH  08/2019   FLEXIBLE SIGMOIDOSCOPY N/A 12/17/2023   Procedure: KINGSTON SIDE;  Surgeon: Shaaron Lamar HERO, MD;  [facility];  Service: Endoscopy;  Laterality: N/A;   HEMORRHOID SURGERY N/A 06/18/2017   Procedure: THREE COLUMN EXTENSIVE HEMORRHOIDECTOMY;  Surgeon: Kallie Manuelita BROCKS, MD;  Location: [facility];  Service: General;  Laterality: N/A;   HERNIA REPAIR     INCISION AND DRAINAGE ABSCESS  N/A 01/06/2017   Procedure: INCISION AND DRAINAGE ABDOMINAL WALL ABSCESS;  Surgeon: Mavis Anes, MD;  Location: [facility];  Service: General;  Laterality: N/A;   incisional hernial repair  09/05/2021   [place]   LEFT HEART CATH AND CORONARY ANGIOGRAPHY N/A 05/09/2020   Procedure: LEFT HEART CATH AND CORONARY ANGIOGRAPHY;  Surgeon: Elmira Newman PARAS, MD;  Location: [facility];  Service: Cardiovascular;  Laterality: N/A;   LEFT HEART CATH AND CORONARY ANGIOGRAPHY N/A 08/22/2023   Procedure: LEFT HEART CATH AND CORONARY ANGIOGRAPHY;  Surgeon: Verlin Lonni BIRCH, MD;  Location: [facility];  Service: Cardiovascular;  Laterality: N/A;   POLYPECTOMY  12/24/2018   Procedure: POLYPECTOMY;  Surgeon: Shaaron Lamar HERO, MD;  [facility];  Service: Endoscopy;;  colon   tendon repar Right    wrist   WOUND EXPLORATION Right 06/24/2013   Procedure: exploration of traumatic wound right wrist;   Surgeon: Franky JONELLE Curia, MD;  [facility];  Service: Orthopedics;  Laterality: Right;   Patient Active Problem List   Diagnosis Date Noted   Carpal tunnel syndrome of left wrist 01/09/2024   Acute lower GI bleeding 12/16/2023   B12 deficiency 11/26/2023   Iron deficiency 11/26/2023   Chest pain 08/21/2023   Dyslipidemia 08/21/2023   Rectal pressure 08/05/2023   Abdominal bloating 08/05/2023   Anemia 08/05/2023   High risk medication use 05/05/2023   Unilateral primary osteoarthritis, left knee 05/05/2023   Seropositive rheumatoid arthritis (HCC) 05/02/2022   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   Cellulitis 10/06/2021   Paroxysmal atrial fibrillation (HCC) 10/06/2021   Abdominal fluid collection 10/06/2021   Constipation 09/14/2021   Ventral hernia 05/15/2021   Dysphagia 05/15/2021   Cigarette nicotine  dependence without complication 09/12/2020   Continuous dependence on cigarette smoking 08/22/2020   Mixed hyperlipidemia 04/27/2020   Recurrent incisional hernia 02/03/2020   Bilateral knee pain 12/29/2019   Coronary artery disease of native artery of native heart with stable angina pectoris 06/25/2019   Acute respiratory failure with hypoxia (HCC) 05/20/2019   Essential hypertension 05/20/2019   COVID-19 05/20/2019   Syncope and collapse 05/20/2019   Metabolic acidosis 05/16/2019   Intractable vomiting with nausea 02/23/2019   Cocaine abuse (HCC)    Colitis 11/11/2018   Hemorrhoidal skin tag    Folate deficiency 05/11/2017   Abdominal wall fistula    Cellulitis of abdominal wall 01/02/2017   Acute left hemiparesis (HCC) 12/05/2016   Malingering 12/05/2016   Weakness of left lower extremity 12/02/2016   CVA (cerebral vascular accident) (HCC) 11/23/2016   Bright red rectal bleeding 08/22/2016   Hypotension due to blood loss    Acute GI bleeding 05/24/2016   Dieulafoy lesion of duodenum    History of Billroth II operation    Gastrointestinal hemorrhage  05/22/2016   Absolute anemia    Acute pancreatitis 10/01/2015   Alcohol abuse with intoxication    Left-sided weakness    Psychosomatic factor in physical condition    Upper GI bleed    Diverticulosis of colon with hemorrhage    Chronic wound infection of abdomen 12/22/2014   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   HTN (hypertension) 12/22/2014   Ataxia 11/01/2014   Hemorrhoids 04/26/2014   Lung nodule 04/26/2014   Diverticulosis    Gastritis    Hiatal hernia    Schatzki's ring    Acute blood loss anemia 04/25/2014   Hypokalemia 04/25/2014   Hematemesis with nausea 04/25/2014   Lung nodule < 6cm on CT  04/25/2014   Intractable nausea and vomiting 04/24/2014   Gastroparesis    Abdominal pain 04/01/2014   Gastroenteritis 12/10/2013   Chronic abdominal wound infection 08/16/2013   MDD (major depressive disorder), recurrent episode, severe (HCC) 06/27/2013   Wrist laceration 06/24/2013   Diarrhea 05/07/2013   Rectal bleeding 05/07/2013   Adrenal mass, left 05/07/2013   Post-operative wound abscess 04/19/2013   Abdominal wall abscess 04/18/2013   Essential hypertension, benign 03/30/2013   History of cocaine abuse (HCC) 03/16/2013   History of schizoaffective disorder 03/16/2013   Bipolar disorder (HCC) 03/16/2013   Personality disorder (HCC) 03/16/2013   Tobacco use disorder 03/16/2013   Alcohol abuse 03/16/2013   Palpitations 03/16/2013   Poor vision 03/16/2013   History of gastric bypass 03/16/2013   Status post hysterectomy with oophorectomy 03/16/2013   Hypothyroid 03/16/2013   SLE (systemic lupus erythematosus) (HCC) 03/16/2013    ONSET DATE: 12/25/2023 referral date, 01/09/24 surgery date  REFERRING DIAG: G56.03 (ICD-10-CM) - Bilateral carpal tunnel syndrome Per Dr. Erwin: Needs OT 5-6 days s/p left endoscopic CTR (01/09/24)  THERAPY DIAG:  Other lack of coordination - Plan: Ot plan of care cert/re-cert  Pain in left wrist - Plan: Ot plan of care cert/re-cert  Pain in  right wrist - Plan: Ot plan of care cert/re-cert  Other symptoms and signs involving the musculoskeletal system - Plan: Ot plan of care cert/re-cert  Other symptoms and signs involving the nervous system - Plan: Ot plan of care cert/re-cert  Other disturbances of skin sensation - Plan: Ot plan of care cert/re-cert  S/P carpal tunnel release - Plan: Ot plan of care cert/re-cert      REFER TO IHP PGS [id number]  SUBJECTIVE:   SUBJECTIVE STATEMENT: Pt reports only a little pain today. Pt accompanied by: self  PERTINENT HISTORY: Pt presents to OT after 01/09/24 CTR surgery on L hand, also has bilateral CTS. Other PPMH includes: schizoaffective disorder, CAD, HTN, afib, lupus, CVA  PRECAUTIONS: None  RED FLAGS: None   WEIGHT BEARING RESTRICTIONS: s/p CTR L wrist  PAIN:  Are you having pain? Yes: NPRS scale: 3.5/10 Pain location: L wrist  Pain description: achy Aggravating factors: movement Relieving factors: medication and rest  FALLS: Has patient fallen in last 6 months? No  LIVING ENVIRONMENT: Lives with: lives alone Lives in: House/apartment apartment, ground level Stairs: No Has following equipment at home: Vannie - 4 wheeled  PLOF: Has an aide, helps with cleaning and cooking, occasionally helps with dressing, has lupus flare ups and hx of stroke, and aide drives her to appts. 7x/days week for 1.5 hours-2 hours  PATIENT GOALS: Pt reports she loves to cook and reports that her hands are weak and hard to open. Wants to get back to doing the things she was doing before with less help.  NEXT MD VISIT: 01/22/24  OBJECTIVE:  Note: Objective measures were completed at Evaluation unless otherwise noted.  HAND DOMINANCE: Left  ADLs: Overall ADLs: Reports needing more help with dressing, reports difficulty with functional movement of hand  FUNCTIONAL OUTCOME MEASURES: Quick Dash: 84.1% impairment   UPPER EXTREMITY ROM:     Active ROM Right eval Left eval   Shoulder flexion    Shoulder abduction    Shoulder adduction    Shoulder extension    Shoulder internal rotation    Shoulder external rotation    Elbow flexion    Elbow extension    Wrist flexion    Wrist extension    Wrist ulnar  deviation    Wrist radial deviation    Wrist pronation    Wrist supination    (Blank rows = not tested)  Active ROM Right eval Left eval  Thumb MCP (0-60)    Thumb IP (0-80)    Thumb Radial abd/add (0-55)     Thumb Palmar abd/add (0-45)     Thumb Opposition to Small Finger     Index MCP (0-90)     Index PIP (0-100)     Index DIP (0-70)      Long MCP (0-90)      Long PIP (0-100)      Long DIP (0-70)      Ring MCP (0-90)      Ring PIP (0-100)      Ring DIP (0-70)      Little MCP (0-90)      Little PIP (0-100)      Little DIP (0-70)      (Blank rows = not tested)   UPPER EXTREMITY MMT:     MMT Right eval Left eval  Shoulder flexion    Shoulder abduction    Shoulder adduction    Shoulder extension    Shoulder internal rotation    Shoulder external rotation    Middle trapezius    Lower trapezius    Elbow flexion    Elbow extension    Wrist flexion    Wrist extension    Wrist ulnar deviation    Wrist radial deviation    Wrist pronation    Wrist supination    (Blank rows = not tested)  HAND FUNCTION: Grip strength: Right: 7.4 lbs; Left: 5.2 lbs  COORDINATION: 9 Hole Peg test: Right: 36.86 sec; Left: 42.21 sec Box and Blocks:  Right 40 blocks, Left 52 blocks  SENSATION: Light touch: Impaired  100% accuracy L hand, 75% accuracy R hand (unable to report being touched on R index finger)  EDEMA: Pt has reported swelling in B hands, reports it comes and goes.  COGNITION: Overall cognitive status: Within functional limits for tasks assessed   OBSERVATIONS: Pain in B hands and wrists d/t bilateral CTS and recent L CTR.    TREATMENT DATE: 01/15/24                                                                                                                            Pt educated in purpose of OT, goals, and POC. Pt in agreement at this time. Provided handout of stretches and exercises per IHP protocol, see Pt instructions for handout. Urged pt to only stretch as much as tolerated with wrist flexion/extension/deviation stretches, pt verbalized understanding.       PATIENT EDUCATION: Education details: SEE ABOVE Person educated: Patient Education method: Explanation, Demonstration, and Handouts Education comprehension: verbalized understanding, returned demonstration, and needs further education  HOME EXERCISE PROGRAM: 01/15/24: IHP Stretches p 15-16  GOALS: Goals reviewed with patient? Yes  SHORT TERM GOALS: Target date: 02/15/24  Pt  will be independent with HEP for wrist ROM, hand strength, and coordination Baseline: Initiated  Goal status: INITIAL  2.  Pt will be independent with scar tissue massage and edema mgmt Baseline: To be instructed  Goal status: INITIAL  3.  Pt will increase L wrist extension by at least 10 degrees for improved functional movement Baseline: 30 degrees Goal status: INITIAL  4.  Pt will increase L wrist flexion by at least 10 degrees for improved functional movement Baseline: 42 degrees Goal status: INITIAL  5.  Pt will be independent with joint protection strategies to manage B CTS and L CTR Baseline: To be educated  Goal status: INITIAL  6.  Pt will increase L radial deviation by 5 degrees Baseline: 7 degrees Goal status: INITIAL  LONG TERM GOALS: Target date: 04/16/24  Patient will demonstrate at least 25% improvement with quick Dash score (reporting 59.1% disability or less) indicating improved functional use of affected extremity.  Baseline: 84.1% impairment Goal status: INITIAL  2.  Pt will explore AE for desired tasks as needed to reduce strain on B wrists Baseline: To be educated  Goal status: INITIAL  3.  Pt will complete light meal prep with  reports of pain no more than 3/10 Baseline: Pt currently unable to complete cooking tasks but wants to return to this Goal status: INITIAL  4.  Pt will increase L wrist flexion/extension by 20 degrees  Baseline: 30 degrees extension, 42 degrees flexion Goal status: INITIAL    ASSESSMENT:  CLINICAL IMPRESSION: Patient is a 58 y.o. female who was seen today for occupational therapy evaluation for L CTR and bilateral CTS. Hx includes lupus, CVA. Patient currently presents below baseline level of functioning demonstrating functional deficits and impairments as noted below. Pt would benefit from skilled OT services in the outpatient setting to work on impairments as noted below to help pt return to PLOF as able.     PERFORMANCE DEFICITS: in functional skills including ADLs, IADLs, coordination, dexterity, sensation, edema, strength, pain, Fine motor control, decreased knowledge of use of DME, skin integrity, and UE functional use,  and psychosocial skills including coping strategies, environmental adaptation, habits, and routines and behaviors.   IMPAIRMENTS: are limiting patient from ADLs, IADLs, rest and sleep, and leisure.   COMORBIDITIES: has co-morbidities such as lupus that affects occupational performance. Patient will benefit from skilled OT to address above impairments and improve overall function.  MODIFICATION OR ASSISTANCE TO COMPLETE EVALUATION: Min-Moderate modification of tasks or assist with assess necessary to complete an evaluation.  OT OCCUPATIONAL PROFILE AND HISTORY: Detailed assessment: Review of records and additional review of physical, cognitive, psychosocial history related to current functional performance.  CLINICAL DECISION MAKING: Moderate - several treatment options, min-mod task modification necessary  REHAB POTENTIAL: Good  EVALUATION COMPLEXITY: Moderate      PLAN:  OT FREQUENCY: 1-2x/week  OT DURATION: 12 weeks  PLANNED INTERVENTIONS: 97168 OT  Re-evaluation, 97535 self care/ADL training, 02889 therapeutic exercise, 97530 therapeutic activity, 97140 manual therapy, 97035 ultrasound, 97018 paraffin, 02960 fluidotherapy, 97010 moist heat, 97010 cryotherapy, scar mobilization, passive range of motion, energy conservation, coping strategies training, patient/family education, and DME and/or AE instructions  RECOMMENDED OTHER SERVICES: none at this time  CONSULTED AND AGREED WITH PLAN OF CARE: Patient  PLAN FOR NEXT SESSION: scar tissue massage if sutures removed Modalities once sutures are removed assuming no open wounds Median nerve glides    DOS: 01/09/24~01/15/24 (6 DAYS POST SURGERY)

## 2024-01-18 ENCOUNTER — Inpatient Hospital Stay (HOSPITAL_COMMUNITY)

## 2024-01-18 ENCOUNTER — Encounter (HOSPITAL_COMMUNITY): Payer: Self-pay | Admitting: Emergency Medicine

## 2024-01-18 ENCOUNTER — Emergency Department (HOSPITAL_COMMUNITY)

## 2024-01-18 ENCOUNTER — Inpatient Hospital Stay (HOSPITAL_COMMUNITY)
Admission: EM | Admit: 2024-01-18 | Discharge: 2024-01-20 | DRG: 103 | Disposition: A | Discharge status: Home / Self Care | Attending: Neurology | Admitting: Neurology

## 2024-01-18 DIAGNOSIS — R29705 NIHSS score 5: Secondary | ICD-10-CM | POA: Diagnosis not present

## 2024-01-18 DIAGNOSIS — I6389 Other cerebral infarction: Secondary | ICD-10-CM | POA: Diagnosis not present

## 2024-01-18 DIAGNOSIS — F1090 Alcohol use, unspecified, uncomplicated: Secondary | ICD-10-CM | POA: Diagnosis present

## 2024-01-18 DIAGNOSIS — I639 Cerebral infarction, unspecified: Secondary | ICD-10-CM | POA: Diagnosis present

## 2024-01-18 DIAGNOSIS — M329 Systemic lupus erythematosus, unspecified: Secondary | ICD-10-CM | POA: Diagnosis present

## 2024-01-18 DIAGNOSIS — Z79899 Other long term (current) drug therapy: Secondary | ICD-10-CM | POA: Diagnosis not present

## 2024-01-18 DIAGNOSIS — I10 Essential (primary) hypertension: Secondary | ICD-10-CM | POA: Diagnosis present

## 2024-01-18 DIAGNOSIS — Z7982 Long term (current) use of aspirin: Secondary | ICD-10-CM

## 2024-01-18 DIAGNOSIS — F449 Dissociative and conversion disorder, unspecified: Secondary | ICD-10-CM | POA: Diagnosis not present

## 2024-01-18 DIAGNOSIS — R471 Dysarthria and anarthria: Secondary | ICD-10-CM | POA: Diagnosis present

## 2024-01-18 DIAGNOSIS — G43109 Migraine with aura, not intractable, without status migrainosus: Secondary | ICD-10-CM | POA: Diagnosis present

## 2024-01-18 DIAGNOSIS — G8194 Hemiplegia, unspecified affecting left nondominant side: Secondary | ICD-10-CM | POA: Diagnosis present

## 2024-01-18 DIAGNOSIS — F17210 Nicotine dependence, cigarettes, uncomplicated: Secondary | ICD-10-CM | POA: Diagnosis present

## 2024-01-18 DIAGNOSIS — R2990 Unspecified symptoms and signs involving the nervous system: Secondary | ICD-10-CM | POA: Diagnosis not present

## 2024-01-18 DIAGNOSIS — R531 Weakness: Secondary | ICD-10-CM

## 2024-01-18 DIAGNOSIS — Z6828 Body mass index (BMI) 28.0-28.9, adult: Secondary | ICD-10-CM | POA: Diagnosis not present

## 2024-01-18 DIAGNOSIS — R29702 NIHSS score 2: Secondary | ICD-10-CM | POA: Diagnosis not present

## 2024-01-18 DIAGNOSIS — E669 Obesity, unspecified: Secondary | ICD-10-CM | POA: Diagnosis present

## 2024-01-18 DIAGNOSIS — Y900 Blood alcohol level of less than 20 mg/100 ml: Secondary | ICD-10-CM | POA: Diagnosis present

## 2024-01-18 DIAGNOSIS — L930 Discoid lupus erythematosus: Secondary | ICD-10-CM | POA: Diagnosis not present

## 2024-01-18 DIAGNOSIS — I2510 Atherosclerotic heart disease of native coronary artery without angina pectoris: Secondary | ICD-10-CM | POA: Diagnosis present

## 2024-01-18 DIAGNOSIS — H5462 Unqualified visual loss, left eye, normal vision right eye: Secondary | ICD-10-CM | POA: Diagnosis present

## 2024-01-18 DIAGNOSIS — R29710 NIHSS score 10: Secondary | ICD-10-CM | POA: Diagnosis present

## 2024-01-18 DIAGNOSIS — E785 Hyperlipidemia, unspecified: Secondary | ICD-10-CM | POA: Diagnosis present

## 2024-01-18 DIAGNOSIS — Z7401 Bed confinement status: Secondary | ICD-10-CM | POA: Diagnosis not present

## 2024-01-18 DIAGNOSIS — F447 Conversion disorder with mixed symptom presentation: Secondary | ICD-10-CM | POA: Diagnosis present

## 2024-01-18 LAB — CBC
HCT: 34 % — ABNORMAL LOW (ref 36.0–46.0)
Hemoglobin: 11.8 g/dL — ABNORMAL LOW (ref 12.0–15.0)
MCH: 24.5 pg — ABNORMAL LOW (ref 26.0–34.0)
MCHC: 34.7 g/dL (ref 30.0–36.0)
MCV: 70.5 fL — ABNORMAL LOW (ref 80.0–100.0)
Platelets: 463 K/uL — ABNORMAL HIGH (ref 150–400)
RBC: 4.82 MIL/uL (ref 3.87–5.11)
RDW: 18.6 % — ABNORMAL HIGH (ref 11.5–15.5)
WBC: 8.5 K/uL (ref 4.0–10.5)
nRBC: 0 % (ref 0.0–0.2)

## 2024-01-18 LAB — URINE DRUG SCREEN
Amphetamines: NEGATIVE
Barbiturates: NEGATIVE
Benzodiazepines: NEGATIVE
Cocaine: NEGATIVE
Fentanyl: NEGATIVE
Methadone Scn, Ur: NEGATIVE
Opiates: NEGATIVE
Tetrahydrocannabinol: NEGATIVE

## 2024-01-18 LAB — COMPREHENSIVE METABOLIC PANEL WITH GFR
ALT: 24 U/L (ref 0–44)
AST: 27 U/L (ref 15–41)
Albumin: 4.3 g/dL (ref 3.5–5.0)
Alkaline Phosphatase: 119 U/L (ref 38–126)
Anion gap: 10 (ref 5–15)
BUN: 5 mg/dL — ABNORMAL LOW (ref 6–20)
CO2: 25 mmol/L (ref 22–32)
Calcium: 9.2 mg/dL (ref 8.9–10.3)
Chloride: 104 mmol/L (ref 98–111)
Creatinine, Ser: 0.56 mg/dL (ref 0.44–1.00)
GFR, Estimated: >60 mL/min (ref >60)
Glucose, Bld: 95 mg/dL (ref 70–99)
Potassium: 3.2 mmol/L — ABNORMAL LOW (ref 3.5–5.1)
Sodium: 140 mmol/L (ref 135–145)
Total Bilirubin: 0.5 mg/dL (ref 0.0–1.2)
Total Protein: 7.1 g/dL (ref 6.5–8.1)

## 2024-01-18 LAB — I-STAT CHEM 8, ED
BUN: 4 mg/dL — ABNORMAL LOW (ref 6–20)
Calcium, Ion: 1.23 mmol/L (ref 1.15–1.40)
Chloride: 106 mmol/L (ref 98–111)
Creatinine, Ser: 0.6 mg/dL (ref 0.44–1.00)
Glucose, Bld: 90 mg/dL (ref 70–99)
HCT: 36 % (ref 36.0–46.0)
Hemoglobin: 12.2 g/dL (ref 12.0–15.0)
Potassium: 3.1 mmol/L — ABNORMAL LOW (ref 3.5–5.1)
Sodium: 141 mmol/L (ref 135–145)
TCO2: 22 mmol/L (ref 22–32)

## 2024-01-18 LAB — DIFFERENTIAL
Abs Immature Granulocytes: 0.02 K/uL (ref 0.00–0.07)
Basophils Absolute: 0 K/uL (ref 0.0–0.1)
Basophils Relative: 0 %
Eosinophils Absolute: 0.2 K/uL (ref 0.0–0.5)
Eosinophils Relative: 2 %
Immature Granulocytes: 0 %
Lymphocytes Relative: 52 %
Lymphs Abs: 4.4 K/uL — ABNORMAL HIGH (ref 0.7–4.0)
Monocytes Absolute: 0.7 K/uL (ref 0.1–1.0)
Monocytes Relative: 8 %
Neutro Abs: 3.2 K/uL (ref 1.7–7.7)
Neutrophils Relative %: 38 %

## 2024-01-18 LAB — CBG MONITORING, ED: Glucose-Capillary: 93 mg/dL (ref 70–99)

## 2024-01-18 LAB — ETHANOL: Alcohol, Ethyl (B): <15 mg/dL (ref <15)

## 2024-01-18 LAB — APTT: aPTT: 27 s (ref 24–36)

## 2024-01-18 LAB — GLUCOSE, CAPILLARY
Glucose-Capillary: 111 mg/dL — ABNORMAL HIGH (ref 70–99)
Glucose-Capillary: 95 mg/dL (ref 70–99)

## 2024-01-18 LAB — PROTIME-INR
INR: 1 (ref 0.8–1.2)
Prothrombin Time: 13.7 s (ref 11.4–15.2)

## 2024-01-18 LAB — MRSA NEXT GEN BY PCR, NASAL: MRSA by PCR Next Gen: NOT DETECTED

## 2024-01-18 MED ORDER — ACETAMINOPHEN 160 MG/5ML PO SOLN: 650.0000 mg | ORAL | Status: DC | PRN

## 2024-01-18 MED ORDER — ACETAMINOPHEN 325 MG PO TABS
650.0000 mg | ORAL_TABLET | ORAL | Status: DC | PRN
  Administered 2024-01-18: 650 mg via ORAL
  Filled 2024-01-18: qty 2

## 2024-01-18 MED ORDER — CHLORHEXIDINE GLUCONATE CLOTH 2 % EX PADS
6.0000 | MEDICATED_PAD | Freq: Every day | CUTANEOUS | Status: DC
  Administered 2024-01-18 – 2024-01-20 (×2): 6 via TOPICAL

## 2024-01-18 MED ORDER — ACETAMINOPHEN 650 MG RE SUPP: 650.0000 mg | RECTAL | Status: DC | PRN

## 2024-01-18 MED ORDER — POTASSIUM CHLORIDE 20 MEQ PO PACK
40.0000 meq | PACK | Freq: Once | ORAL | Status: AC
  Administered 2024-01-18: 40 meq via ORAL
  Filled 2024-01-18: qty 2

## 2024-01-18 MED ORDER — LABETALOL HCL 5 MG/ML IV SOLN: 10.0000 mg | Freq: Once | INTRAVENOUS | Status: DC | PRN

## 2024-01-18 MED ORDER — DULOXETINE HCL 30 MG PO CPEP
30.0000 mg | ORAL_CAPSULE | Freq: Two times a day (BID) | ORAL | Status: DC
  Administered 2024-01-18 – 2024-01-20 (×4): 30 mg via ORAL
  Filled 2024-01-18 (×5): qty 1

## 2024-01-18 MED ORDER — PROCHLORPERAZINE EDISYLATE 10 MG/2ML IJ SOLN
10.0000 mg | Freq: Once | INTRAMUSCULAR | Status: AC
  Administered 2024-01-18: 10 mg via INTRAVENOUS
  Filled 2024-01-18: qty 2

## 2024-01-18 MED ORDER — ORAL CARE MOUTH RINSE: 15.0000 mL | OROMUCOSAL | Status: DC | PRN

## 2024-01-18 MED ORDER — PANTOPRAZOLE SODIUM 40 MG IV SOLR
40.0000 mg | Freq: Every day | INTRAVENOUS | Status: DC
  Administered 2024-01-18 – 2024-01-19 (×2): 40 mg via INTRAVENOUS
  Filled 2024-01-18: qty 10

## 2024-01-18 MED ORDER — SODIUM CHLORIDE 0.9 % IV SOLN
INTRAVENOUS | Status: DC
Start: 2024-01-18 — End: 2024-01-19

## 2024-01-18 MED ORDER — IOHEXOL 350 MG/ML SOLN
75.0000 mL | Freq: Once | INTRAVENOUS | Status: AC | PRN
  Administered 2024-01-18: 75 mL via INTRAVENOUS

## 2024-01-18 MED ORDER — TENECTEPLASE FOR STROKE: 0.2500 mg/kg | PACK | Freq: Once | INTRAVENOUS | Status: DC

## 2024-01-18 MED ORDER — ORAL CARE MOUTH RINSE
15.0000 mL | OROMUCOSAL | Status: DC
  Administered 2024-01-18 – 2024-01-20 (×5): 15 mL via OROMUCOSAL

## 2024-01-18 MED ORDER — NICARDIPINE HCL IN NACL 20-0.86 MG/200ML-% IV SOLN: 0.0000 mg/h | INTRAVENOUS | Status: DC | PRN

## 2024-01-18 MED ORDER — HYDROXYCHLOROQUINE SULFATE 200 MG PO TABS
300.0000 mg | ORAL_TABLET | Freq: Every day | ORAL | Status: DC
  Administered 2024-01-19 – 2024-01-20 (×2): 300 mg via ORAL
  Filled 2024-01-18 (×2): qty 1.5

## 2024-01-18 MED ORDER — TENECTEPLASE FOR STROKE
0.2500 mg/kg | PACK | Freq: Once | INTRAVENOUS | Status: AC
  Administered 2024-01-18: 19 mg via INTRAVENOUS

## 2024-01-18 MED ORDER — TENECTEPLASE FOR STROKE
PACK | INTRAVENOUS | Status: AC
  Filled 2024-01-18: qty 10

## 2024-01-18 MED ORDER — STROKE: EARLY STAGES OF RECOVERY BOOK
Freq: Once | Status: AC
  Filled 2024-01-18: qty 1

## 2024-01-18 NOTE — Consult Note (Signed)
 [facility] Telemedicine Consult   Requesting Provider: Suzette Consult Participants: nurse Location of the provider: [facility] Location of the patient: [facility]  This consult was provided via telemedicine with 2-way video and audio communication. The patient/family was informed that care would be provided in this way and agreed to receive care in this manner.    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 Patient wa at church when noted acute onset of symptoms.   Has had some recent rectal bleeding but did not have significant drop in hemoglobin and did not require transfusion.  Also with recent endoscopic carpal tunnel release surgery.    LKW: 01/18/2024 @ 1545 tpa given?: Yes.  Administration time 1730 IR Thrombectomy? No, No target lesion identified Modified Rankin Scale: 1-No significant post stroke disability and can perform usual duties with stroke symptoms Time of teleneurologist evaluation: 1647  Exam: Vitals:   01/18/24 1644  BP: (!) 161/113  Pulse: 88  Resp: 17  Temp: 98.3 F (36.8 C)  SpO2: 99%    General: NAD  1A: Level of Consciousness - 0 1B: Ask Month and Age - 1 1C: 'Blink Eyes' & 'Squeeze Hands' - 0 2: Test Horizontal Extraocular Movements - 0 3: Test Visual Fields - 0 4: Test Facial Palsy - 0 5A: Test Left Arm Motor Drift - 3 5B: Test Right Arm Motor Drift - 0 6A: Test Left Leg Motor Drift - 3 6B: Test Right Leg Motor Drift - 0 7: Test Limb Ataxia - 0 8: Test Sensation - 1 9: Test Language/Aphasia- 1 10: Test Dysarthria - 1 11: Test Extinction/Inattention - 0 NIHSS score: 10   Imaging Reviewed:  CT HEAD WITHOUT 01/18/2024 05:05:43 PM   TECHNIQUE: CT of the head was performed without the administration of  intravenous contrast. Automated exposure control, iterative reconstruction, and/or weight based adjustment of the mA/kV was utilized to reduce the radiation dose to as low as reasonably achievable.   COMPARISON: Head MRI 03/15/2023 and CTA 03/14/2023.   CLINICAL HISTORY: Neuro deficit, acute, stroke suspected. Left sided weakness, blurred vision, and headache. History of stroke.   FINDINGS:   BRAIN AND VENTRICLES: There is no evidence of an acute infarct, intracranial hemorrhage, mass, midline shift, hydrocephalus, or extra-axial fluid collection. Cerebral volume is normal. A mildly enlarged partially empty sella is unchanged.   ORBITS: No acute abnormality.   SINUSES AND MASTOIDS: No acute abnormality.   SOFT TISSUES AND SKULL: No acute skull fracture. No acute soft tissue abnormality.   [place] Stroke Program Early CT Score (ASPECTS) ----- Ganglionic (caudate, IC, lentiform nucleus, insula, M1-M3): 7 Supraganglionic (M4-M6): 3 Total: 10  CTA HEAD AND NECK WITHOUT AND WITH 01/18/2024 05:39:17 PM   TECHNIQUE: CTA of the head and neck was performed without and with the administration of 75 mL of iohexol  (OMNIPAQUE ) 350 MG/ML intravenous contrast. Multiplanar 2D and/or 3D reformatted images are provided for review. Automated exposure control, iterative reconstruction, and/or weight based adjustment of the mA/kV was utilized to reduce the radiation dose to as low as reasonably achievable. Stenosis of the internal carotid arteries measured using NASCET criteria.   COMPARISON: CTA head and neck 03/14/2023   CLINICAL HISTORY: Neuro deficit, acute, stroke suspected. Left sided weakness, blurry vision, and headache.   FINDINGS:   CTA  NECK:   AORTIC ARCH AND ARCH VESSELS: Normal variant aortic arch branching pattern with common origin of the brachiocephalic and left common carotid arteries. No dissection or arterial injury. No significant stenosis of the  brachiocephalic or subclavian arteries.   CERVICAL CAROTID ARTERIES: Minimal calcified plaque at the left carotid bifurcation. No dissection, arterial injury, or hemodynamically significant stenosis by NASCET criteria.   CERVICAL VERTEBRAL ARTERIES: Codominant vertebral arteries. No dissection, arterial injury, or significant stenosis.   LUNGS AND MEDIASTINUM: Motion artifact and mild atelectasis in the included upper lungs.   SOFT TISSUES: No acute abnormality.   BONES: Disc degeneration at C5-C6 and C6-C7.   CTA HEAD:   ANTERIOR CIRCULATION: The internal carotid arteries are patent from skull base to carotid termini with mild atherosclerosis not resulting in a significant stenosis. ACAs and MCAs are patent with branch vessel evaluation being mildly limited by venous contamination, however no discrete proximal branch occlusion or significant proximal stenosis is identified. No aneurysm.   POSTERIOR CIRCULATION: The intracranial vertebral arteries are widely patent to the basilar. Patent PICA and SCA origins are visualized bilaterally. The basilar artery is widely patent. There are right larger than left posterior communicating arteries. Both PCAs are patent without evidence of a significant proximal stenosis. No aneurysm.   OTHER: No dural venous sinus thrombosis on this non-dedicated study.   IMPRESSION: 1. Mild atherosclerosis without a large vessel occlusion or significant proximal stenosis in the head or neck. 2. These results were communicated to Dr. FREDRIK Hock at 5:51 PM on 01/18/2024 by secure text page via the [facility] messaging system.   IMPRESSION: 1. No acute intracranial abnormality. ASPECTS of 10.  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  NIHSS of 10.  Head CT personally reviewed and shows no acute changes.  Risks, benefits and alternative therapies to TNK treatment discussed and patient provided verbal consent for TNK.  Contraindications reviewed.  TNK administered but was delayed due to difficulty obtaining IV access.  TNK administered at 1730 with no immediate complication noted.  Pre-TNK BP of 153/98  Recommendations:  No antiplatelet or antithrombotic therapy for 24 hours MRI of the brain without contrast.  If not performed 24 hours after TNK patient to have repeat head CT without contrast to rule out bleed prior to initiation of antithrombotic therapy BP to be kept <180/105 Telemetry Frequent neuro checks Echocardiogram NPO until RN stroke swallow screen 8.   If decompensation in neurological examination patient to have STAT repeat head CT without contrast    This patient is receiving care for possible acute neurological changes. Care by this provider at the time of service included time for direct evaluation via telemedicine, review of medical records, imaging studies and discussion of findings with providers, the patient and/or family.  Sonny Hock, MD Neurology   If 8pm- 8am, please page neurology on call as listed in AMION.

## 2024-01-18 NOTE — ED Triage Notes (Signed)
 Pt here from church with c/o left sided weakness and along with blurred vision and headache , has hx of stroke , lkw approx 1 hr ago

## 2024-01-18 NOTE — ED Notes (Signed)
 Decision for TNK at 1708. Unable to obtain IV access.

## 2024-01-18 NOTE — ED Provider Notes (Signed)
 [place] EMERGENCY DEPARTMENT AT [facility] Provider Note   CSN: [id number] Arrival date & time: 01/18/24  8366  An emergency department physician performed an initial assessment on this suspected stroke patient at 1643.  Patient presents with: Weakness   Angel Price is a 58 y.o. female.  {Add pertinent medical, surgical, social history, OB history to YEP:67052} Patient has a history of lupus and stroke.  She was in church today and she started having difficulty getting her words out with some blurred vision and weakness in her left arm.  She came quickly to the emergency department and a code stroke was called  The history is provided by the patient and medical records. No language interpreter was used.  Weakness Severity:  Moderate Onset quality:  Sudden Timing:  Constant Progression:  Unchanged Chronicity:  New Context: not alcohol use   Relieved by:  Nothing Worsened by:  Nothing Ineffective treatments:  None tried Associated symptoms: no abdominal pain, no chest pain, no cough, no diarrhea, no frequency, no headaches and no seizures        Prior to Admission medications   Medication Sig Start Date End Date Taking? Authorizing Provider  oxyCODONE  (ROXICODONE ) 5 MG immediate release tablet Take 1 tablet (5 mg total) by mouth every 6 (six) hours as needed. 01/09/24   Agarwala, Anshul, MD  amLODipine  (NORVASC ) 10 MG tablet Take 10 mg by mouth daily. 12/22/23   [provider]  aspirin  EC 81 MG tablet Take 1 tablet (81 mg total) by mouth daily with breakfast. 12/21/23   Pearlean, Courage, MD  atenolol  (TENORMIN ) 50 MG tablet Take 1 tablet (50 mg total) by mouth daily. 12/20/23   Pearlean Manus, MD  Cholecalciferol  50 MCG (2000 UT) CAPS Take 1 tablet by mouth daily.    [provider]  Cyanocobalamin  (B-12) 1000 MCG SUBL Place 1,000 mcg under the tongue daily. 11/26/23   Ezzard Sonny RAMAN, PA-C  DULoxetine  (CYMBALTA ) 30 MG capsule Take 30 mg by mouth  2 (two) times daily. 08/14/21   [provider]  gabapentin  (NEURONTIN ) 100 MG capsule Take 100 mg by mouth 3 (three) times daily. 03/03/23   [provider]  hydroxychloroquine  (PLAQUENIL ) 200 MG tablet Take 1.5 tablets (300 mg total) by mouth daily. 11/03/23   Rice, Lonni ORN, MD  mirtazapine  (REMERON ) 15 MG tablet Take 15 mg by mouth at bedtime as needed (for sleep). 08/10/21   [provider]  nitroGLYCERIN  (NITROSTAT ) 0.4 MG SL tablet Place 1 tablet (0.4 mg total) under the tongue every 5 (five) minutes as needed for chest pain. 12/29/19 01/06/24  Patwardhan, Newman PARAS, MD  pantoprazole  (PROTONIX ) 40 MG tablet Take 1 tablet (40 mg total) by mouth daily before breakfast. 12/18/23   Pearlean Manus, MD  promethazine  (PHENERGAN ) 25 MG tablet Take 0.5 tablets (12.5 mg total) by mouth every 6 (six) hours as needed for nausea or vomiting. 12/18/23   Pearlean Manus, MD  thiamine  (VITAMIN B1) 100 MG tablet Take 100 mg by mouth daily. 04/26/23   [provider]  tiZANidine (ZANAFLEX) 2 MG tablet Take 2 mg by mouth at bedtime. 10/17/23   [provider]    Allergies: Metoclopramide  and Codeine    Review of Systems  Constitutional:  Negative for appetite change and fatigue.  HENT:  Negative for congestion, ear discharge and sinus pressure.   Eyes:  Negative for discharge.  Respiratory:  Negative for cough.   Cardiovascular:  Negative for chest pain.  Gastrointestinal:  Negative for abdominal pain and diarrhea.  Genitourinary:  Negative for frequency and hematuria.  Musculoskeletal:  Negative for back pain.  Skin:  Negative for rash.  Neurological:  Positive for weakness. Negative for seizures and headaches.  Psychiatric/Behavioral:  Negative for hallucinations.     Updated Vital Signs BP (!) 138/93   Pulse 67   Temp 98.3 F (36.8 C) (Oral)   Resp 18   Wt 75.4 kg   LMP  (LMP Unknown)   SpO2 97%   BMI 29.45 kg/m   Physical Exam Vitals and  nursing note reviewed.  Constitutional:      Appearance: She is well-developed.  HENT:     Head: Normocephalic.     Nose: Nose normal.  Eyes:     General: No scleral icterus.    Conjunctiva/sclera: Conjunctivae normal.  Neck:     Thyroid : No thyromegaly.  Cardiovascular:     Rate and Rhythm: Normal rate and regular rhythm.     Heart sounds: No murmur heard.    No friction rub. No gallop.  Pulmonary:     Breath sounds: No stridor. No wheezing or rales.  Chest:     Chest wall: No tenderness.  Abdominal:     General: There is no distension.     Tenderness: There is no abdominal tenderness. There is no rebound.  Musculoskeletal:     Cervical back: Neck supple.     Comments: Weakness in left arm  Lymphadenopathy:     Cervical: No cervical adenopathy.  Skin:    Findings: No erythema or rash.  Neurological:     Mental Status: She is oriented to person, place, and time.     Motor: No abnormal muscle tone.     Coordination: Coordination normal.     Comments: Patient having slurred speech and difficulty getting her words out  Psychiatric:        Behavior: Behavior normal.     (all labs ordered are listed, but only abnormal results are displayed) Labs Reviewed  CBC - Abnormal; Notable for the following components:      Result Value   Hemoglobin 11.8 (*)    HCT 34.0 (*)    MCV 70.5 (*)    MCH 24.5 (*)    RDW 18.6 (*)    Platelets 463 (*)    All other components within normal limits  COMPREHENSIVE METABOLIC PANEL WITH GFR - Abnormal; Notable for the following components:   Potassium 3.2 (*)    BUN 5 (*)    All other components within normal limits  DIFFERENTIAL - Abnormal; Notable for the following components:   Lymphs Abs 4.4 (*)    All other components within normal limits  I-STAT CHEM 8, ED - Abnormal; Notable for the following components:   Potassium 3.1 (*)    BUN 4 (*)    All other components within normal limits  PROTIME-INR  APTT  ETHANOL  URINE DRUG SCREEN   CBG MONITORING, ED    EKG: None  Radiology: CT ANGIO HEAD NECK W WO CM (CODE STROKE) Result Date: 01/18/2024 EXAM: CTA HEAD AND NECK WITHOUT AND WITH 01/18/2024 05:39:17 PM TECHNIQUE: CTA of the head and neck was performed without and with the administration of 75 mL of iohexol  (OMNIPAQUE ) 350 MG/ML intravenous contrast. Multiplanar 2D and/or 3D reformatted images are provided for review. Automated exposure control, iterative reconstruction, and/or weight based adjustment of the mA/kV was utilized to reduce the radiation dose to as low as reasonably achievable. Stenosis  of the internal carotid arteries measured using NASCET criteria. COMPARISON: CTA head and neck 03/14/2023 CLINICAL HISTORY: Neuro deficit, acute, stroke suspected. Left sided weakness, blurry vision, and headache. FINDINGS: CTA NECK: AORTIC ARCH AND ARCH VESSELS: Normal variant aortic arch branching pattern with common origin of the brachiocephalic and left common carotid arteries. No dissection or arterial injury. No significant stenosis of the brachiocephalic or subclavian arteries. CERVICAL CAROTID ARTERIES: Minimal calcified plaque at the left carotid bifurcation. No dissection, arterial injury, or hemodynamically significant stenosis by NASCET criteria. CERVICAL VERTEBRAL ARTERIES: Codominant vertebral arteries. No dissection, arterial injury, or significant stenosis. LUNGS AND MEDIASTINUM: Motion artifact and mild atelectasis in the included upper lungs. SOFT TISSUES: No acute abnormality. BONES: Disc degeneration at C5-C6 and C6-C7. CTA HEAD: ANTERIOR CIRCULATION: The internal carotid arteries are patent from skull base to carotid termini with mild atherosclerosis not resulting in a significant stenosis. ACAs and MCAs are patent with branch vessel evaluation being mildly limited by venous contamination, however no discrete proximal branch occlusion or significant proximal stenosis is identified. No aneurysm. POSTERIOR CIRCULATION:  The intracranial vertebral arteries are widely patent to the basilar. Patent PICA and SCA origins are visualized bilaterally. The basilar artery is widely patent. There are right larger than left posterior communicating arteries. Both PCAs are patent without evidence of a significant proximal stenosis. No aneurysm. OTHER: No dural venous sinus thrombosis on this non-dedicated study. IMPRESSION: 1. Mild atherosclerosis without a large vessel occlusion or significant proximal stenosis in the head or neck. 2. These results were communicated to Dr. FREDRIK Hock at 5:51 PM on 01/18/2024 by secure text page via the [facility] messaging system. Electronically signed by: Dasie Hamburg MD 01/18/2024 05:59 PM EST RP Workstation: [id number]   CT HEAD CODE STROKE WO CONTRAST (LKW 0-4.5h, LVO 0-24h) Result Date: 01/18/2024 EXAM: CT HEAD WITHOUT 01/18/2024 05:05:43 PM TECHNIQUE: CT of the head was performed without the administration of intravenous contrast. Automated exposure control, iterative reconstruction, and/or weight based adjustment of the mA/kV was utilized to reduce the radiation dose to as low as reasonably achievable. COMPARISON: Head MRI 03/15/2023 and CTA 03/14/2023. CLINICAL HISTORY: Neuro deficit, acute, stroke suspected. Left sided weakness, blurred vision, and headache. History of stroke. FINDINGS: BRAIN AND VENTRICLES: There is no evidence of an acute infarct, intracranial hemorrhage, mass, midline shift, hydrocephalus, or extra-axial fluid collection. Cerebral volume is normal. A mildly enlarged partially empty sella is unchanged. ORBITS: No acute abnormality. SINUSES AND MASTOIDS: No acute abnormality. SOFT TISSUES AND SKULL: No acute skull fracture. No acute soft tissue abnormality. [place] Stroke Program Early CT Score (ASPECTS) ----- Ganglionic (caudate, IC, lentiform nucleus, insula, M1-M3): 7 Supraganglionic (M4-M6): 3 Total: 10 IMPRESSION: 1. No acute intracranial abnormality. ASPECTS of 10. 2. These  results were communicated to Dr. FREDRIK Hock at 5:16 PM on 01/18/2024 by secure text page via the [facility] messaging system. Electronically signed by: Dasie Hamburg MD 01/18/2024 05:20 PM EST RP Workstation: [id number]    {Document cardiac monitor, telemetry assessment procedure when appropriate:32947} Procedures   Medications Ordered in the ED  tenecteplase (TNKASE) injection for Stroke 19 mg (19 mg Intravenous Given 01/18/24 1730)  iohexol  (OMNIPAQUE ) 350 MG/ML injection 75 mL (75 mLs Intravenous Contrast Given 01/18/24 1727)   CRITICAL CARE Performed by: Fairy Sermon Total critical care time: 40 minutes Critical care time was exclusive of separately billable procedures and treating other patients. Critical care was necessary to treat or prevent imminent or life-threatening deterioration. Critical care was time spent personally by me on the  following activities: development of treatment plan with patient and/or surrogate as well as nursing, discussions with consultants, evaluation of patient's response to treatment, examination of patient, obtaining history from patient or surrogate, ordering and performing treatments and interventions, ordering and review of laboratory studies, ordering and review of radiographic studies, pulse oximetry and re-evaluation of patient's condition.   Patient was seen by neurology for code stroke.  Thrombolytics were given.  No significant change in neurostatus.  Patient is now complaining of a bad headache and will get a CT of her head.  She did have a headache initially before the thrombolytics but she acts like it is getting worse {Click here for ABCD2, HEART and other calculators REFRESH Note before signing:1}                              Medical Decision Making Amount and/or Complexity of Data Reviewed Labs: ordered. Radiology: ordered.  Risk Decision regarding hospitalization.   Acute stroke that has been given thrombolytics.  Patient will be admitted to  neurology over at [facility]  {Document critical care time when appropriate  Document review of labs and clinical decision tools ie CHADS2VASC2, etc  Document your independent review of radiology images and any outside records  Document your discussion with family members, caretakers and with consultants  Document social determinants of health affecting pt's care  Document your decision making why or why not admission, treatments were needed:32947:::1}   Final diagnoses:  Stroke, acute, thrombotic [facility])    ED Discharge Orders     None

## 2024-01-18 NOTE — ED Notes (Signed)
 Provider notified of pt headache; stating it hit her real hard. No neuro changes from baseline assessment at this time.

## 2024-01-19 ENCOUNTER — Inpatient Hospital Stay (HOSPITAL_COMMUNITY)

## 2024-01-19 ENCOUNTER — Encounter: Payer: Self-pay | Admitting: Radiology

## 2024-01-19 DIAGNOSIS — I6389 Other cerebral infarction: Secondary | ICD-10-CM | POA: Diagnosis not present

## 2024-01-19 DIAGNOSIS — L930 Discoid lupus erythematosus: Secondary | ICD-10-CM

## 2024-01-19 DIAGNOSIS — F17210 Nicotine dependence, cigarettes, uncomplicated: Secondary | ICD-10-CM

## 2024-01-19 DIAGNOSIS — R29702 NIHSS score 2: Secondary | ICD-10-CM | POA: Diagnosis not present

## 2024-01-19 DIAGNOSIS — F449 Dissociative and conversion disorder, unspecified: Secondary | ICD-10-CM | POA: Diagnosis not present

## 2024-01-19 DIAGNOSIS — G43109 Migraine with aura, not intractable, without status migrainosus: Secondary | ICD-10-CM

## 2024-01-19 DIAGNOSIS — I639 Cerebral infarction, unspecified: Secondary | ICD-10-CM | POA: Diagnosis not present

## 2024-01-19 DIAGNOSIS — E785 Hyperlipidemia, unspecified: Secondary | ICD-10-CM

## 2024-01-19 DIAGNOSIS — I10 Essential (primary) hypertension: Secondary | ICD-10-CM

## 2024-01-19 DIAGNOSIS — R29705 NIHSS score 5: Secondary | ICD-10-CM

## 2024-01-19 DIAGNOSIS — R2990 Unspecified symptoms and signs involving the nervous system: Secondary | ICD-10-CM

## 2024-01-19 LAB — BASIC METABOLIC PANEL WITH GFR
Anion gap: 10 (ref 5–15)
BUN: 5 mg/dL — ABNORMAL LOW (ref 6–20)
CO2: 21 mmol/L — ABNORMAL LOW (ref 22–32)
Calcium: 9 mg/dL (ref 8.9–10.3)
Chloride: 109 mmol/L (ref 98–111)
Creatinine, Ser: 0.48 mg/dL (ref 0.44–1.00)
GFR, Estimated: >60 mL/min (ref >60)
Glucose, Bld: 94 mg/dL (ref 70–99)
Potassium: 3.4 mmol/L — ABNORMAL LOW (ref 3.5–5.1)
Sodium: 140 mmol/L (ref 135–145)

## 2024-01-19 LAB — ECHOCARDIOGRAM COMPLETE
AR max vel: 4.3 cm2
AV Area VTI: 3.85 cm2
AV Area mean vel: 3.84 cm2
AV Mean grad: 4 mmHg
AV Peak grad: 8.1 mmHg
Ao pk vel: 1.42 m/s
Area-P 1/2: 2.82 cm2
Calc EF: 68.3 %
MV VTI: 3.22 cm2
S' Lateral: 2.7 cm
Single Plane A2C EF: 70.2 %
Single Plane A4C EF: 65 %
Weight: 2610.25 [oz_av]

## 2024-01-19 LAB — LIPID PANEL
Cholesterol: 197 mg/dL (ref 0–200)
HDL: 40 mg/dL — ABNORMAL LOW (ref >40)
LDL Cholesterol: 142 mg/dL — ABNORMAL HIGH (ref 0–99)
Total CHOL/HDL Ratio: 4.9 ratio
Triglycerides: 76 mg/dL (ref <150)
VLDL: 15 mg/dL (ref 0–40)

## 2024-01-19 LAB — CBC
HCT: 30.4 % — ABNORMAL LOW (ref 36.0–46.0)
Hemoglobin: 10.6 g/dL — ABNORMAL LOW (ref 12.0–15.0)
MCH: 24.5 pg — ABNORMAL LOW (ref 26.0–34.0)
MCHC: 34.9 g/dL (ref 30.0–36.0)
MCV: 70.4 fL — ABNORMAL LOW (ref 80.0–100.0)
Platelets: 433 K/uL — ABNORMAL HIGH (ref 150–400)
RBC: 4.32 MIL/uL (ref 3.87–5.11)
RDW: 18.5 % — ABNORMAL HIGH (ref 11.5–15.5)
WBC: 6.7 K/uL (ref 4.0–10.5)
nRBC: 0 % (ref 0.0–0.2)

## 2024-01-19 LAB — GLUCOSE, CAPILLARY
Glucose-Capillary: 98 mg/dL (ref 70–99)
Glucose-Capillary: 99 mg/dL (ref 70–99)

## 2024-01-19 LAB — HEMOGLOBIN A1C
Hgb A1c MFr Bld: 5.4 % (ref 4.8–5.6)
Mean Plasma Glucose: 108.28 mg/dL

## 2024-01-19 MED ORDER — ATORVASTATIN CALCIUM 40 MG PO TABS
40.0000 mg | ORAL_TABLET | Freq: Every day | ORAL | Status: DC
  Administered 2024-01-19 – 2024-01-20 (×2): 40 mg via ORAL
  Filled 2024-01-19 (×2): qty 1

## 2024-01-19 MED ORDER — MIRTAZAPINE 15 MG PO TBDP
15.0000 mg | ORAL_TABLET | Freq: Every evening | ORAL | Status: DC | PRN
  Administered 2024-01-19: 15 mg via ORAL
  Filled 2024-01-19 (×2): qty 1

## 2024-01-19 MED ORDER — NICOTINE 14 MG/24HR TD PT24
14.0000 mg | MEDICATED_PATCH | Freq: Every day | TRANSDERMAL | Status: DC
  Administered 2024-01-19 – 2024-01-20 (×2): 14 mg via TRANSDERMAL
  Filled 2024-01-19 (×2): qty 1

## 2024-01-19 MED ORDER — TIZANIDINE HCL 2 MG PO TABS
2.0000 mg | ORAL_TABLET | Freq: Every day | ORAL | Status: DC
  Administered 2024-01-19: 2 mg via ORAL
  Filled 2024-01-19 (×2): qty 1

## 2024-01-19 MED ORDER — LORAZEPAM 2 MG/ML IJ SOLN
1.0000 mg | Freq: Once | INTRAMUSCULAR | Status: AC | PRN
  Administered 2024-01-19: 1 mg via INTRAVENOUS
  Filled 2024-01-19: qty 1

## 2024-01-19 MED ORDER — GABAPENTIN 100 MG PO CAPS
100.0000 mg | ORAL_CAPSULE | Freq: Two times a day (BID) | ORAL | Status: DC
  Administered 2024-01-19 – 2024-01-20 (×3): 100 mg via ORAL
  Filled 2024-01-19 (×3): qty 1

## 2024-01-19 NOTE — Progress Notes (Signed)
 SLP Cancellation Note  Patient Details Name: Angel Price MRN: [id number] DOB: [birth year]   Cancelled treatment:       Reason Eval/Treat Not Completed: Patient at procedure or test/unavailable;pt with echo when SLP attempted evaluation.  ST will continue efforts as able.   Pat [name],M.S.,CCC-SLP 01/19/2024, 10:18 AM

## 2024-01-19 NOTE — Evaluation (Signed)
 Physical Therapy Evaluation Patient Details Name: Angel Price MRN: [id number] DOB: 03-21-1965 Today's Date: 01/19/2024  History of Present Illness  58 y.o. female presented to [facility] ED 01/18/24 with left-sided weakness that started abruptly with severe pain behind her left temple blurred vision in her left eye. TNK given at 1730 CT head negative. Awaiting MRI 24 hrs post TPA PMH: lupus, stroke, hypertension  Clinical Impression  PTA pt living alone in single story apartment with 2 steps to enter. Pt has [facility] 2 hrs/day 7day/wk who assists with iADLs. Pt furniture walks and uses Rollator for limited community ambulation, uses Medicaid transportation. Pt with increased L sided weakness, and decreased L sided coordination and decreased endurance . Pt exhibits full L LE AROM but with hip flexion and knee extension, lacks eccentric control to return to neutral foot on the floor position. With second attempt, pt uses R LE to lower L LE. Pt is supervision for bed mobility and contact guard for transfers and ambulation with RW. Exhibits L knee buckling with distance. Given patient has Medicaid transportation, recommend OP Neuro PT. Pt may also need additional Aide hours due safety with decreased endurance.       If plan is discharge home, recommend the following: A little help with walking and/or transfers;A little help with bathing/dressing/bathroom;Assistance with cooking/housework;Assist for transportation;Help with stairs or ramp for entrance   Can travel by private vehicle   Yes     Equipment Recommendations None recommended by PT     Functional Status Assessment Patient has had a recent decline in their functional status and demonstrates the ability to make significant improvements in function in a reasonable and predictable amount of time.     Precautions / Restrictions Precautions Precautions: Fall Restrictions Weight Bearing Restrictions Per Provider Order: Yes LUE Weight  Bearing Per Provider Order: Non weight bearing Other Position/Activity Restrictions: L carpal tunnel surgery      Mobility  Bed Mobility Overal bed mobility: Needs Assistance Bed Mobility: Supine to Sit     Supine to sit: Supervision, HOB elevated, Used rails     General bed mobility comments: with use of bedrail pt is able to pull herself to seated at the EoB    Transfers Overall transfer level: Needs assistance   Transfers: Sit to/from Stand Sit to Stand: Contact guard assist           General transfer comment: contact guard for safety, good power up and steadies with increased weightshift to R LE    Ambulation/Gait Ambulation/Gait assistance: Contact guard assist, Min assist Gait Distance (Feet): 80 Feet Assistive device: Rolling walker (2 wheels) Gait Pattern/deviations: Knees buckling, Decreased weight shift to left, Decreased stance time - left Gait velocity: slowed Gait velocity interpretation: <1.31 ft/sec, indicative of household ambulator   General Gait Details: overall contact guard for safety, decreased weightshift to L and occasional L knee buckle but able to maintain overall stability with UE support on RW     Modified Rankin (Stroke Patients Only) Modified Rankin (Stroke Patients Only) Pre-Morbid Rankin Score: Slight disability Modified Rankin: Moderate disability     Balance Overall balance assessment: Needs assistance Sitting-balance support: Feet supported, No upper extremity supported Sitting balance-Leahy Scale: Good     Standing balance support: During functional activity, No upper extremity supported Standing balance-Leahy Scale: Fair                               Pertinent  Vitals/Pain Pain Assessment Pain Assessment: 0-10 Pain Score: 2  Pain Location: L wrist from carpal tunnel surgery Pain Descriptors / Indicators: Sore, Aching Pain Intervention(s): Limited activity within patient's tolerance, Monitored during  session, Repositioned    Home Living Family/patient expects to be discharged to:: Private residence Living Arrangements: Alone Available Help at Discharge: Personal care attendant (2) Type of Home: Apartment Home Access: Stairs to enter Entrance Stairs-Rails: None Entrance Stairs-Number of Steps: 2     Home Equipment: Rollator (4 wheels)      Prior Function Prior Level of Function : Needs assist             Mobility Comments: furniture walks in apartment, medicaid transportation to doctors, ambulating limited community distances ADLs Comments: [facility] 2 hrs/7days/wk     Extremity/Trunk Assessment   Upper Extremity Assessment Upper Extremity Assessment: Defer to OT evaluation    Lower Extremity Assessment Lower Extremity Assessment: LLE deficits/detail LLE Deficits / Details: L hip flex 3/5, knee extension 4/5 knee flexion 4/5, ankle dorsi/plantar flex 4/5, able to exhibit full AROM for LE extension, however lacks any eccentric control to return to flexed, but inconsistent with additional attempts LLE Sensation: decreased light touch LLE Coordination: decreased fine motor;decreased gross motor    Cervical / Trunk Assessment Cervical / Trunk Assessment: Kyphotic  Communication   Communication Communication: No apparent difficulties    Cognition Arousal: Alert Behavior During Therapy: WFL for tasks assessed/performed   PT - Cognitive impairments: Safety/Judgement, Problem solving                       PT - Cognition Comments: inconsistent with sequencing with RW Following commands: Impaired Following commands impaired: Follows multi-step commands inconsistently, Follows multi-step commands with increased time     Cueing Cueing Techniques: Verbal cues, Tactile cues, Visual cues     General Comments General comments (skin integrity, edema, etc.): VSS on RA, son in room        Assessment/Plan    PT Assessment Patient needs continued PT services   PT Problem List Decreased strength;Decreased activity tolerance;Decreased balance;Decreased mobility;Decreased coordination;Decreased safety awareness;Impaired sensation       PT Treatment Interventions DME instruction;Gait training;Stair training;Functional mobility training;Therapeutic activities;Therapeutic exercise;Balance training;Neuromuscular re-education;Patient/family education    PT Goals (Current goals can be found in the Care Plan section)  Acute Rehab PT Goals Patient Stated Goal: go home PT Goal Formulation: With patient/family Time For Goal Achievement: 02/02/24 Potential to Achieve Goals: Fair    Frequency Min 3X/week        AM-PAC PT 6 Clicks Mobility  Outcome Measure Help needed turning from your back to your side while in a flat bed without using bedrails?: None Help needed moving from lying on your back to sitting on the side of a flat bed without using bedrails?: A Little Help needed moving to and from a bed to a chair (including a wheelchair)?: A Little Help needed standing up from a chair using your arms (e.g., wheelchair or bedside chair)?: A Little Help needed to walk in hospital room?: A Little Help needed climbing 3-5 steps with a railing? : A Lot 6 Click Score: 18    End of Session   Activity Tolerance: Patient tolerated treatment well Patient left: in bed;with call bell/phone within reach;with bed alarm set;with family/visitor present Nurse Communication: Mobility status PT Visit Diagnosis: Unsteadiness on feet (R26.81);Other abnormalities of gait and mobility (R26.89);Other symptoms and signs involving the nervous system (R29.898);Difficulty in walking, not elsewhere  classified (R26.2);Hemiplegia and hemiparesis Hemiplegia - Right/Left: Left Hemiplegia - caused by: Cerebral infarction    Time: 1530-1616 PT Time Calculation (min) (ACUTE ONLY): 46 min   Charges:   PT Evaluation $PT Eval Moderate Complexity: 1 Mod PT Treatments $Gait  Training: 8-22 mins $Therapeutic Activity: 8-22 mins PT General Charges $$ ACUTE PT VISIT: 1 Visit         Jeanet Lupe B. Fleeta Lapidus PT, DPT Acute Rehabilitation Services Please use secure chat or  Call Office [phone]   Almarie KATHEE [facility] 01/19/2024, 5:15 PM

## 2024-01-19 NOTE — H&P (Signed)
 NEUROLOGY H&P NOTE   Date of service: January 19, 2024 Patient Name: Angel Price MRN:  [id number] DOB:  [birth year] Chief Complaint: Left-sided weakness and numbness  History of Present Illness  Angel Price is a 58 y.o. female with hx of lupus, stroke, hypertension who presents with left-sided weakness that started abruptly while she was at church.  She states that the first thing that she noticed was severe pain behind her left temple and subsequently she noticed blurred vision in her left eye.  She then developed left-sided weakness and numbness and therefore presented to the emergency department at [facility] where she was evaluated by telepresence.  After discussing the risk, benefits of TNK the patient opted to go ahead with this medication and it was administered at any [facility].   Last known well: 3:45 PM Modified rankin score: One IV Thrombolysis: Yes Thrombectomy: No, no LVO  NIHSS components Score: Comment  1a Level of Conscious 0[x]  1[]  2[]  3[]      1b LOC Questions 0[x]  1[]  2[]       1c LOC Commands 0[x]  1[]  2[]       2 Best Gaze 0[x]  1[]  2[]       3 Visual 0[x]  1[]  2[]  3[]      4 Facial Palsy 0[]  1[x]  2[]  3[]      5a Motor Arm - left 0[]  1[x]  2[]  3[]  4[]  UN[]    5b Motor Arm - Right 0[x]  1[]  2[]  3[]  4[]  UN[]    6a Motor Leg - Left 0[]  1[x]  2[]  3[]  4[]  UN[]    6b Motor Leg - Right 0[x]  1[]  2[]  3[]  4[]  UN[]    7 Limb Ataxia 0[x]  1[]  2[]  UN[]      8 Sensory 0[]  1[x]  2[]  UN[]      9 Best Language 0[]  1[x]  2[]  3[]      10 Dysarthria 0[x]  1[]  2[]  UN[]      11 Extinct. and Inattention 0[x]  1[]  2[]       TOTAL: 5      Past History   Past Medical History:  Diagnosis Date   Abscess    soft tissue   Adrenal mass    Alcohol abuse    Anemia    Anxiety    Blood transfusion without reported diagnosis    Chronic abdominal pain    Chronic wound infection of abdomen    Colon polyp    colonoscopy 04/2014   Coronary artery disease    Mild non obstr CAD   Depression    Discoid  lupus erythematosus    Diverticulosis    colonoscopy 04/2014 moderat pan colonic   Drug-seeking behavior    Gastritis    EGD 05/2014   Gastroparesis 01/2014   GERD (gastroesophageal reflux disease)    Hemorrhoid    internal large   Hiatal hernia    History of Billroth II operation    Hypertension    Lung nodule    CT 02/2014 needs repeat 1 month   Lung nodule < 6cm on CT 04/25/2014   Lupus    Malingering    Migraine    Nausea and vomiting    chronic, recurrent   Pancreatitis    Schatzki's ring    patent per EGD 04/2014   Sickle cell trait    Stroke (HCC)    Suicide attempt (HCC)    Thyroid  disease 2000   overactive, radiation   Past Surgical History:  Procedure Laterality Date   ABDOMINAL HYSTERECTOMY  2013   [place]   ABDOMINAL HYSTERECTOMY  ABDOMINAL SURGERY     ADRENALECTOMY Right    AGILE CAPSULE N/A 01/05/2015   Procedure: AGILE CAPSULE;  Surgeon: Lamar CHRISTELLA Hollingshead, MD;  [facility];  Service: Endoscopy;  Laterality: N/A;  0700   BALLOON DILATION N/A 10/01/2021   Procedure: BALLOON DILATION;  Surgeon: Cindie Carlin POUR, DO;  [facility];  Service: Endoscopy;  Laterality: N/A;   Billroth II procedure      [place], first 2000, 2005/2006.   bilroth 2     BIOPSY  05/20/2013   Procedure: BIOPSIES OF ASCENDING AND SIGMOID COLON;  Surgeon: Lamar CHRISTELLA Hollingshead, MD;  Location: [facility];  Service: Endoscopy;;   BIOPSY  04/26/2014   Procedure: BIOPSIES;  Surgeon: Margo LITTIE Haddock, MD;  [facility]: [facility];  Service: Endoscopy;;   BIOPSY  12/24/2018   Procedure: BIOPSY;  Surgeon: Hollingshead Lamar CHRISTELLA, MD;  [facility];  Service: Endoscopy;;  colon   CARPAL TUNNEL RELEASE Left 01/09/2024   Procedure: RELEASE, CARPAL TUNNEL, ENDOSCOPIC;  Surgeon: Arlinda Buster, MD;  Location: [facility];  Service: Orthopedics;  Laterality: Left;   CHOLECYSTECTOMY     COLONOSCOPY     in [place]   COLONOSCOPY N/A 12/16/2023   Procedure: COLONOSCOPY;   Surgeon: Cindie Carlin POUR, DO;  [facility];  Service: Endoscopy;  Laterality: N/A;  145pm, ok rm 1   COLONOSCOPY WITH PROPOFOL  N/A 05/20/2013   Dr.Rourk- inadequate prep, normal appearing rectum, grossly normal colon aside from pancolonic diverticula, normal terminal ileum bx= unremarkable colonic mucosa. Due for early interval 2016.    COLONOSCOPY WITH PROPOFOL  N/A 04/26/2014   DOQ:wnmfjo ileum/one colon polyp removed/moderate pan-colonic diverticulosis/large internal hemorrhoids   COLONOSCOPY WITH PROPOFOL  N/A 12/23/2014   Dr.Rourk- minimal internal hemorrhoids, pancolonic diverticulosis   COLONOSCOPY WITH PROPOFOL  N/A 08/23/2016   Procedure: COLONOSCOPY WITH PROPOFOL ;  Surgeon: Haddock Margo LITTIE, MD;  [facility];  Service: Endoscopy;  Laterality: N/A;   COLONOSCOPY WITH PROPOFOL  N/A 12/24/2018   Pancolonic diverticulosis, normal TI, one 5 mm polyp in rectum (tubular adenoma). Somewhat friable hemorrhagic mucosa in area of IC valve/cecum, possibly related to scope trauma s/p biopsy. Repeat in 7 years.   DEBRIDEMENT OF ABDOMINAL WALL ABSCESS N/A 02/08/2013   Procedure: DEBRIDEMENT OF ABDOMINAL WALL ABSCESS;  Surgeon: Oneil DELENA Budge, MD;  [facility]: [facility];  Service: General;  Laterality: N/A;   ESOPHAGEAL DILATION N/A 12/16/2023   Procedure: DILATION, ESOPHAGUS;  Surgeon: Cindie Carlin POUR, DO;  [facility];  Service: Endoscopy;  Laterality: N/A;   ESOPHAGOGASTRODUODENOSCOPY N/A 12/16/2023   Procedure: EGD (ESOPHAGOGASTRODUODENOSCOPY);  Surgeon: Cindie Carlin POUR, DO;  [facility];  Service: Endoscopy;  Laterality: N/A;   ESOPHAGOGASTRODUODENOSCOPY (EGD) WITH PROPOFOL  N/A 05/20/2013   Dr.Rourk- s/p prior gastric surgery with normal esophagus, residual gastric mucosa and patent efferent limb   ESOPHAGOGASTRODUODENOSCOPY (EGD) WITH PROPOFOL  N/A 02/03/2014   Dr. Hollingshead:  s/p hemigastrectomy with retained gastric contents. Residual gastric mucosa and  efferent limb appeared normal otherwise. Query gastroparesis.    ESOPHAGOGASTRODUODENOSCOPY (EGD) WITH PROPOFOL  N/A 04/26/2014   DOQ:dryjusxp'd ring/small HH/mild non-erosive gasrtitis/normal anastomosis   ESOPHAGOGASTRODUODENOSCOPY (EGD) WITH PROPOFOL  N/A 12/23/2014   Dr.Rourk- s/p prior hemigastrctomy, active oozing from anastomotic suture site, hemostasis achieved   ESOPHAGOGASTRODUODENOSCOPY (EGD) WITH PROPOFOL  N/A 05/23/2016   Dr. 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    ESOPHAGOGASTRODUODENOSCOPY (EGD) WITH PROPOFOL  N/A 05/11/2017  Procedure: ESOPHAGOGASTRODUODENOSCOPY (EGD) WITH PROPOFOL ;  Surgeon: Shaaron Lamar HERO, MD;  [facility];  Service: Endoscopy;  Laterality: N/A;   ESOPHAGOGASTRODUODENOSCOPY (EGD) WITH PROPOFOL  N/A 06/07/2021   Procedure: ESOPHAGOGASTRODUODENOSCOPY (EGD) WITH PROPOFOL ;  Surgeon: Shaaron Lamar HERO, MD;  [facility];  Service: Endoscopy;  Laterality: N/A;  11:45am   ESOPHAGOGASTRODUODENOSCOPY (EGD) WITH PROPOFOL  N/A 10/01/2021   Procedure: ESOPHAGOGASTRODUODENOSCOPY (EGD) WITH PROPOFOL ;  Surgeon: Cindie Carlin POUR, DO;  [facility];  Service: Endoscopy;  Laterality: N/A;  3:00pm   EXCISION OF MESH  08/2019   FLEXIBLE SIGMOIDOSCOPY N/A 12/17/2023   Procedure: KINGSTON SIDE;  Surgeon: Shaaron Lamar HERO, MD;  [facility];  Service: Endoscopy;  Laterality: N/A;   HEMORRHOID SURGERY N/A 06/18/2017   Procedure: THREE COLUMN EXTENSIVE HEMORRHOIDECTOMY;  Surgeon: Kallie Manuelita BROCKS, MD;  Location: [facility];  Service: General;  Laterality: N/A;   HERNIA REPAIR     INCISION AND DRAINAGE ABSCESS N/A 01/06/2017   Procedure: INCISION AND DRAINAGE ABDOMINAL WALL ABSCESS;  Surgeon: Mavis Anes, MD;  Location: [facility];  Service: General;  Laterality: N/A;   incisional hernial repair  09/05/2021   [place]   LEFT HEART CATH AND  CORONARY ANGIOGRAPHY N/A 05/09/2020   Procedure: LEFT HEART CATH AND CORONARY ANGIOGRAPHY;  Surgeon: Elmira Newman PARAS, MD;  [facility];  Service: Cardiovascular;  Laterality: N/A;   LEFT HEART CATH AND CORONARY ANGIOGRAPHY N/A 08/22/2023   Procedure: LEFT HEART CATH AND CORONARY ANGIOGRAPHY;  Surgeon: Verlin Lonni BIRCH, MD;  Location: [facility];  Service: Cardiovascular;  Laterality: N/A;   POLYPECTOMY  12/24/2018   Procedure: POLYPECTOMY;  Surgeon: Shaaron Lamar HERO, MD;  [facility];  Service: Endoscopy;;  colon   tendon repar Right    wrist   WOUND EXPLORATION Right 06/24/2013   Procedure: exploration of traumatic wound right wrist;  Surgeon: Franky JONELLE Curia, MD;  Location: [facility];  Service: Orthopedics;  Laterality: Right;   Family History  Problem Relation Age of Onset   Drug abuse Mother    Lung cancer Father    Cancer Father        mets   Drug abuse Sister    Drug abuse Brother    Breast cancer Maternal Aunt    Bipolar disorder Maternal Aunt    Drug abuse Maternal Aunt    Colon cancer Maternal Grandmother        late 55s, early 25s   Bipolar disorder Paternal Grandfather    Brain cancer Son    Schizophrenia Son    Cancer Son        brain   Bipolar disorder Cousin    Liver disease Neg Hx    Social History   Socioeconomic History   Marital status: Divorced    Spouse name: Not on file   Number of children: 4   Years of education: 10   Highest education level: Not on file  Occupational History   Not on file  Tobacco Use   Smoking status: Every Day    Current packs/day: 0.25    Average packs/day: 0.3 packs/day for 35.0 years (8.8 ttl pk-yrs)    Types: Cigarettes    Passive exposure: Past   Smokeless tobacco: Never  Vaping Use   Vaping status: Never Used  Substance and Sexual Activity   Alcohol use: Not Currently    Comment: quit in 01/2023, previously heavy   Drug use: Not Currently  Types: Cocaine    Comment: no  cocaine in4  years per patient (08/20/21)   Sexual activity: Not Currently    Birth control/protection: Surgical  Other Topics Concern   Not on file  Social History Narrative   Caffeine- tea occass   Social Drivers of Health   Financial Resource Strain: Low Risk  (09/11/2023)   Received from [facility]   Overall Financial Resource Strain (CARDIA)    Difficulty of Paying Living Expenses: Not hard at all  Food Insecurity: No Food Insecurity (12/17/2023)   Hunger Vital Sign    Worried About Running Out of Food in the Last Year: Never true    Ran Out of Food in the Last Year: Never true  Transportation Needs: No Transportation Needs (12/17/2023)   PRAPARE - Administrator, Civil Service (Medical): No    Lack of Transportation (Non-Medical): No  Physical Activity: Unknown (09/11/2023)   Received from [facility]   Exercise Vital Sign    On average, how many days per week do you engage in moderate to strenuous exercise (like a brisk walk)?: 0 days    Minutes of Exercise per Session: Not on file  Stress: No Stress Concern Present (09/11/2023)   Received from [facility] - Occupational Stress Questionnaire    Feeling of Stress : Only a little  Social Connections: Socially Integrated (09/11/2023)   Received from [facility]   Social Network    How would you rate your social network (family, work, friends)?: Good participation with social networks   Allergies  Allergen Reactions   Metoclopramide  Anaphylaxis and Other (See Comments)    Reglan  EPS symptoms  Jerking movements   Codeine Hives    Medications   Current Facility-Administered Medications:    0.9 %  sodium chloride  infusion, , Intravenous, Continuous, Michaela Aisha SQUIBB, MD, Last Rate: 50 mL/hr at 01/19/24 0100, Infusion Verify at 01/19/24 0100   acetaminophen  (TYLENOL ) tablet 650 mg, 650 mg, Oral, Q4H PRN, 650 mg at 01/18/24 2152 **OR** acetaminophen  (TYLENOL )  160 MG/5ML solution 650 mg, 650 mg, Per Tube, Q4H PRN **OR** acetaminophen  (TYLENOL ) suppository 650 mg, 650 mg, Rectal, Q4H PRN, Michaela Aisha SQUIBB, MD   Chlorhexidine  Gluconate Cloth 2 % PADS 6 each, 6 each, Topical, Daily, Michaela Aisha SQUIBB, MD, 6 each at 01/18/24 2153   DULoxetine  (CYMBALTA ) DR capsule 30 mg, 30 mg, Oral, BID, Michaela Aisha SQUIBB, MD, 30 mg at 01/18/24 2151   hydroxychloroquine  (PLAQUENIL ) tablet 300 mg, 300 mg, Oral, Daily, Michaela Aisha SQUIBB, MD   labetalol  (NORMODYNE ) injection 10 mg, 10 mg, Intravenous, Once PRN **AND** nicardipine (CARDENE) 20mg  in 0.86% saline 200ml IV infusion (0.1 mg/ml), 0-15 mg/hr, Intravenous, Continuous PRN, Michaela Aisha SQUIBB, MD   Oral care mouth rinse, 15 mL, Mouth Rinse, 4 times per day, Michaela Aisha SQUIBB, MD, 15 mL at 01/18/24 2153   Oral care mouth rinse, 15 mL, Mouth Rinse, PRN, Michaela Aisha SQUIBB, MD   pantoprazole  (PROTONIX ) injection 40 mg, 40 mg, Intravenous, QHS, Michaela Aisha SQUIBB, MD, 40 mg at 01/18/24 2153   Vitals   Vitals:   01/19/24 0030 01/19/24 0100 01/19/24 0130 01/19/24 0200  BP: 107/84 106/66 98/71 102/75  Pulse: 70 72 73 (!) 59  Resp: 18 19 19 18   Temp:      TempSrc:      SpO2: 96% 95% 95% 95%  Weight:         Body mass index is  28.9 kg/m.  Physical Exam   Constitutional: Appears well-developed and well-nourished.  Psych: Affect appropriate to situation.  Eyes: No scleral injection.  HENT: No OP obstruction.  Head: Normocephalic.  Cardiovascular: Normal rate and regular rhythm.  Respiratory: Effort normal, non-labored breathing.  GI: Soft.  No distension. There is no tenderness.  Skin: WDI.   Neurologic Examination    Neuro: Mental Status: Patient is awake, alert, oriented to person, place, month, year, and situation. Patient is able to give a clear and coherent history. No signs of aphasia or neglect Cranial Nerves: II: Visual Fields are full. Pupils are equal,  round, and reactive to light.   III,IV, VI: EOMI without ptosis or diploplia.  V: Facial sensation is diminished on the left, she does split midline VII: Facial movement is symmetric.  VIII: hearing is intact to voice X: Uvula elevates symmetrically XII: tongue is midline without atrophy or fasciculations.  Motor: Tone is normal. Bulk is normal. 5/5 strength was present in the right, she has mild weakness of the left arm and leg Sensory: Sensation is diminished in the left Cerebellar: FNF and HKS are intact on the right, consistent with weakness in the left    Labs   CBC:  Recent Labs  Lab 01/18/24 1710 01/18/24 1716  WBC 8.5  --   NEUTROABS 3.2  --   HGB 11.8* 12.2  HCT 34.0* 36.0  MCV 70.5*  --   PLT 463*  --    Basic Metabolic Panel:  Lab Results  Component Value Date   NA 141 01/18/2024   K 3.1 (L) 01/18/2024   CO2 25 01/18/2024   GLUCOSE 90 01/18/2024   BUN 4 (L) 01/18/2024   CREATININE 0.60 01/18/2024   CALCIUM  9.2 01/18/2024   GFRNONAA >60 01/18/2024   GFRAA 119 05/05/2020   Lipid Panel:  Lab Results  Component Value Date   LDLCALC 93 11/14/2023   HgbA1c:  Lab Results  Component Value Date   HGBA1C 5.4 01/19/2024   Urine Drug Screen:     Component Value Date/Time   LABOPIA NEGATIVE 01/18/2024 1646   COCAINSCRNUR NEGATIVE 01/18/2024 1646   LABBENZ NEGATIVE 01/18/2024 1646   AMPHETMU NEGATIVE 01/18/2024 1646   THCU NEGATIVE 01/18/2024 1646   LABBARB NEGATIVE 01/18/2024 1646    Alcohol Level     Component Value Date/Time   ETH <15 01/18/2024 1710   INR  Lab Results  Component Value Date   INR 1.0 01/18/2024   APTT  Lab Results  Component Value Date   APTT 27 01/18/2024     CT Head without contrast(Personally reviewed): Negative  CT angio Head and Neck with contrast(Personally reviewed): No LVO  Assessment   MEILA BERKE is a 58 y.o. female presenting with left-sided weakness and headache.  The unilateral ipsilateral  visual change associated with her left-sided weakness is unusual as is the fact that she splits midline due to sensory change.  Possibilities include complicated migraine versus acute ischemic stroke versus conversion.  She has been administered TNK, and has a reported history of stroke and therefore we will continue to pursue secondary risk factor modification.  Primary Diagnosis:  Cerebral infarction, unspecified.  Secondary Diagnosis: Essential (primary) hypertension Lupus(I see mentions of both discoid and SLE)   Recommendations  - HgbA1c, fasting lipid panel - MRI of the brain without contrast - Frequent neuro checks - Echocardiogram - Prophylactic therapy-none for 24 hours - Risk factor modification - Telemetry monitoring - PT consult, OT consult, Speech  consult - Continue home Plaquenil  and Cymbalta  - Stroke team to follow  ______________________________________________________________________   Signed, Aisha Seals, MD [facility]

## 2024-01-19 NOTE — Plan of Care (Signed)

## 2024-01-19 NOTE — Progress Notes (Signed)
 OT Cancellation Note  Patient Details Name: Angel Price MRN: [id number] DOB: 1965/09/22   Cancelled Treatment:    Reason Eval/Treat Not Completed: Medical issues which prohibited therapy;Active bedrest order Patient with active bed rest orders, as well as TNK administered at 1740 on 01/18/24. Per therapy protocol, OT is to wait 24 hours status post TNK administration unless otherwise approved by neurology. OT will follow back when medically appropriate.   Ronal Gift E. Mayleen Borrero, OTR/L Acute Rehabilitation Services [phone]   Ronal Gift Salt 01/19/2024, 7:49 AM

## 2024-01-19 NOTE — Progress Notes (Signed)
 STROKE TEAM PROGRESS NOTE    SIGNIFICANT HOSPITAL EVENTS  Presented 11/3 due to left-sided weakness, bilateral vision issues, left-sided numbness.  TNK was given at [facility].  Patient transferred to [facility] for further workup.  INTERIM HISTORY/SUBJECTIVE Patient states she had developed sudden onset of headache followed by vision loss in the left eye and then numbness in the left face and body which gradually spread.  It lasted several hours and started improving after TNK injection but this morning she feels she is a lot better. On exam, patient has inconsistent sensation, vibration exam.   MRI pending for 24 hours post TNK. Blood pressure adequately controlled OBJECTIVE  CBC    Component Value Date/Time   WBC 6.7 01/19/2024 0439   RBC 4.32 01/19/2024 0439   HGB 10.6 (L) 01/19/2024 0439   HGB 13.5 11/26/2023 1529   HCT 30.4 (L) 01/19/2024 0439   HCT 42.8 11/26/2023 1529   PLT 433 (H) 01/19/2024 0439   PLT 321 11/26/2023 1529   MCV 70.4 (L) 01/19/2024 0439   MCV 79 11/26/2023 1529   MCH 24.5 (L) 01/19/2024 0439   MCHC 34.9 01/19/2024 0439   RDW 18.5 (H) 01/19/2024 0439   RDW 17.1 (H) 11/26/2023 1529   LYMPHSABS 4.4 (H) 01/18/2024 1710   LYMPHSABS 4.1 (H) 11/26/2023 1529   MONOABS 0.7 01/18/2024 1710   EOSABS 0.2 01/18/2024 1710   EOSABS 0.1 11/26/2023 1529   BASOSABS 0.0 01/18/2024 1710   BASOSABS 0.0 11/26/2023 1529    BMET    Component Value Date/Time   NA 140 01/19/2024 0439   NA 144 05/15/2021 1524   K 3.4 (L) 01/19/2024 0439   CL 109 01/19/2024 0439   CO2 21 (L) 01/19/2024 0439   GLUCOSE 94 01/19/2024 0439   BUN 5 (L) 01/19/2024 0439   BUN 7 05/15/2021 1524   CREATININE 0.48 01/19/2024 0439   CREATININE 0.45 (L) 03/26/2023 1124   CALCIUM  9.0 01/19/2024 0439   EGFR 114 10/31/2022 1157   EGFR 109 05/15/2021 1524   GFRNONAA >60 01/19/2024 0439    IMAGING past 24 hours ECHOCARDIOGRAM COMPLETE Result Date: 01/19/2024    ECHOCARDIOGRAM REPORT   Patient  Name:   Angel Price Date of Exam: 01/19/2024 Medical Rec #:  [id number]        Height:       63.0 in Accession #:    [id number]       Weight:       163.1 lb Date of Birth:  May 31, 1965        BSA:          1.773 m Patient Age:    58 years         BP:           108/72 mmHg Patient Gender: F                HR:           73 bpm. Exam Location:  Inpatient Procedure: 2D Echo, Cardiac Doppler and Color Doppler (Both Spectral and Color            Flow Doppler were utilized during procedure). Indications:    Stroke I63.9  History:        Patient has prior history of Echocardiogram examinations, most                 recent 02/23/2023. CAD, Stroke, Arrythmias:Palpitations,  Signs/Symptoms:Syncope; Risk Factors:Hyperlipidemia.  Sonographer:    BERNARDA ROCKS Referring Phys: ANIUS.BANISTER MCNEILL P KIRKPATRICK IMPRESSIONS  1. Left ventricular ejection fraction, by estimation, is 60 to 65%. Left ventricular ejection fraction by 2D MOD biplane is 68.3 %. The left ventricle has normal function. The left ventricle has no regional wall motion abnormalities. Left ventricular diastolic parameters were normal.  2. Right ventricular systolic function is normal. The right ventricular size is normal.  3. The mitral valve is normal in structure. No evidence of mitral valve regurgitation. No evidence of mitral stenosis.  4. The aortic valve is normal in structure. Aortic valve regurgitation is not visualized. No aortic stenosis is present.  5. The inferior vena cava is normal in size with <50% respiratory variability, suggesting right atrial pressure of 8 mmHg. Comparison(s): Compared to prior echo, no significant changes noted. FINDINGS  Left Ventricle: Left ventricular ejection fraction, by estimation, is 60 to 65%. Left ventricular ejection fraction by 2D MOD biplane is 68.3 %. The left ventricle has normal function. The left ventricle has no regional wall motion abnormalities. The left ventricular internal cavity size was normal  in size. There is no left ventricular hypertrophy. Left ventricular diastolic parameters were normal. Right Ventricle: The right ventricular size is normal. No increase in right ventricular wall thickness. Right ventricular systolic function is normal. Left Atrium: Left atrial size was normal in size. Right Atrium: Right atrial size was normal in size. Pericardium: There is no evidence of pericardial effusion. Mitral Valve: The mitral valve is normal in structure. No evidence of mitral valve regurgitation. No evidence of mitral valve stenosis. MV peak gradient, 5.3 mmHg. The mean mitral valve gradient is 2.0 mmHg. Tricuspid Valve: The tricuspid valve is normal in structure. Tricuspid valve regurgitation is not demonstrated. No evidence of tricuspid stenosis. Aortic Valve: The aortic valve is normal in structure. Aortic valve regurgitation is not visualized. No aortic stenosis is present. Aortic valve mean gradient measures 4.0 mmHg. Aortic valve peak gradient measures 8.1 mmHg. Aortic valve area, by VTI measures 3.85 cm. Pulmonic Valve: The pulmonic valve was normal in structure. Pulmonic valve regurgitation is not visualized. No evidence of pulmonic stenosis. Aorta: The aortic root is normal in size and structure. Venous: The inferior vena cava is normal in size with less than 50% respiratory variability, suggesting right atrial pressure of 8 mmHg. IAS/Shunts: No atrial level shunt detected by color flow Doppler.  LEFT VENTRICLE PLAX 2D                        Biplane EF (MOD) LVIDd:         4.60 cm         LV Biplane EF:   Left LVIDs:         2.70 cm                          ventricular LV PW:         0.90 cm                          ejection LV IVS:        1.00 cm                          fraction by LVOT diam:     2.40 cm  2D MOD LV SV:         117                              biplane is LV SV Index:   66                               68.3 %. LVOT Area:     4.52 cm                                 Diastology                                LV e' medial:    8.05 cm/s LV Volumes (MOD)               LV E/e' medial:  11.9 LV vol d, MOD    75.8 ml       LV e' lateral:   8.81 cm/s A2C:                           LV E/e' lateral: 10.9 LV vol d, MOD    82.8 ml A4C: LV vol s, MOD    22.6 ml A2C: LV vol s, MOD    29.0 ml A4C: LV SV MOD A2C:   53.2 ml LV SV MOD A4C:   82.8 ml LV SV MOD BP:    55.9 ml RIGHT VENTRICLE             IVC RV Basal diam:  2.90 cm     IVC diam: 1.40 cm RV S prime:     12.80 cm/s TAPSE (M-mode): 2.2 cm LEFT ATRIUM             Index        RIGHT ATRIUM           Index LA diam:        4.00 cm 2.26 cm/m   RA Area:     12.10 cm LA Vol (A2C):   51.5 ml 29.04 ml/m  RA Volume:   24.30 ml  13.70 ml/m LA Vol (A4C):   29.4 ml 16.58 ml/m LA Biplane Vol: 39.5 ml 22.28 ml/m  AORTIC VALVE                    PULMONIC VALVE AV Area (Vmax):    4.30 cm     PV Vmax:          1.12 m/s AV Area (Vmean):   3.84 cm     PV Peak grad:     5.0 mmHg AV Area (VTI):     3.85 cm     PR End Diast Vel: 2.17 msec AV Vmax:           142.00 cm/s AV Vmean:          89.200 cm/s AV VTI:            0.303 m AV Peak Grad:      8.1 mmHg AV Mean Grad:      4.0 mmHg LVOT Vmax:         135.00 cm/s LVOT Vmean:        75.800 cm/s  LVOT VTI:          0.258 m LVOT/AV VTI ratio: 0.85  AORTA Ao Root diam: 3.40 cm Ao Asc diam:  3.30 cm MITRAL VALVE MV Area (PHT): 2.82 cm    SHUNTS MV Area VTI:   3.22 cm    Systemic VTI:  0.26 m MV Peak grad:  5.3 mmHg    Systemic Diam: 2.40 cm MV Mean grad:  2.0 mmHg MV Vmax:       1.15 m/s MV Vmean:      63.1 cm/s MV Decel Time: 269 msec MV E velocity: 96.10 cm/s MV A velocity: 87.90 cm/s MV E/A ratio:  1.09 Franck Azobou Tonleu Electronically signed by Joelle Cedars Tonleu Signature Date/Time: 01/19/2024/11:59:57 AM    Final    CT HEAD WO CONTRAST Result Date: 01/19/2024 EXAM: CT HEAD WITHOUT CONTRAST 01/19/2024 12:28:00 AM TECHNIQUE: CT of the head was performed without the administration of  intravenous contrast. Automated exposure control, iterative reconstruction, and/or weight based adjustment of the mA/kV was utilized to reduce the radiation dose to as low as reasonably achievable. COMPARISON: 01/18/2024 CLINICAL HISTORY: Stroke, follow up. FINDINGS: BRAIN AND VENTRICLES: Empty sella noted, stable. No acute hemorrhage. No evidence of acute infarct. No hydrocephalus. No extra-axial collection. No mass effect or midline shift. ORBITS: No acute abnormality. SINUSES: No acute abnormality. SOFT TISSUES AND SKULL: No acute soft tissue abnormality. No skull fracture. IMPRESSION: 1. Stable head CT. No acute intracranial abnormality. Electronically signed by: Morene Hoard MD 01/19/2024 01:01 AM EST RP Workstation: [id number]   CT Head Wo Contrast Result Date: 01/18/2024 EXAM: CT HEAD WITHOUT CONTRAST 01/18/2024 06:56:13 PM TECHNIQUE: CT of the head was performed without the administration of intravenous contrast. Automated exposure control, iterative reconstruction, and/or weight based adjustment of the mA/kV was utilized to reduce the radiation dose to as low as reasonably achievable. COMPARISON: Earlier today. CLINICAL HISTORY: Headache, fever. FINDINGS: BRAIN AND VENTRICLES: No acute hemorrhage. No evidence of acute infarct. No hydrocephalus. No extra-axial collection. No mass effect or midline shift. ORBITS: No acute abnormality. SINUSES: No acute abnormality. SOFT TISSUES AND SKULL: No acute soft tissue abnormality. No skull fracture. IMPRESSION: 1. No acute intracranial abnormality. Electronically signed by: Franky Crease MD 01/18/2024 07:06 PM EST RP Workstation: [id number]   CT ANGIO HEAD NECK W WO CM (CODE STROKE) Result Date: 01/18/2024 EXAM: CTA HEAD AND NECK WITHOUT AND WITH 01/18/2024 05:39:17 PM TECHNIQUE: CTA of the head and neck was performed without and with the administration of 75 mL of iohexol  (OMNIPAQUE ) 350 MG/ML intravenous contrast. Multiplanar 2D and/or 3D reformatted  images are provided for review. Automated exposure control, iterative reconstruction, and/or weight based adjustment of the mA/kV was utilized to reduce the radiation dose to as low as reasonably achievable. Stenosis of the internal carotid arteries measured using NASCET criteria. COMPARISON: CTA head and neck 03/14/2023 CLINICAL HISTORY: Neuro deficit, acute, stroke suspected. Left sided weakness, blurry vision, and headache. FINDINGS: CTA NECK: AORTIC ARCH AND ARCH VESSELS: Normal variant aortic arch branching pattern with common origin of the brachiocephalic and left common carotid arteries. No dissection or arterial injury. No significant stenosis of the brachiocephalic or subclavian arteries. CERVICAL CAROTID ARTERIES: Minimal calcified plaque at the left carotid bifurcation. No dissection, arterial injury, or hemodynamically significant stenosis by NASCET criteria. CERVICAL VERTEBRAL ARTERIES: Codominant vertebral arteries. No dissection, arterial injury, or significant stenosis. LUNGS AND MEDIASTINUM: Motion artifact and mild atelectasis in the included upper lungs. SOFT TISSUES: No acute abnormality. BONES: Disc degeneration at C5-C6  and C6-C7. CTA HEAD: ANTERIOR CIRCULATION: The internal carotid arteries are patent from skull base to carotid termini with mild atherosclerosis not resulting in a significant stenosis. ACAs and MCAs are patent with branch vessel evaluation being mildly limited by venous contamination, however no discrete proximal branch occlusion or significant proximal stenosis is identified. No aneurysm. POSTERIOR CIRCULATION: The intracranial vertebral arteries are widely patent to the basilar. Patent PICA and SCA origins are visualized bilaterally. The basilar artery is widely patent. There are right larger than left posterior communicating arteries. Both PCAs are patent without evidence of a significant proximal stenosis. No aneurysm. OTHER: No dural venous sinus thrombosis on this  non-dedicated study. IMPRESSION: 1. Mild atherosclerosis without a large vessel occlusion or significant proximal stenosis in the head or neck. 2. These results were communicated to Dr. FREDRIK Hock at 5:51 PM on 01/18/2024 by secure text page via the [facility] messaging system. Electronically signed by: Dasie Hamburg MD 01/18/2024 05:59 PM EST RP Workstation: [id number]   CT HEAD CODE STROKE WO CONTRAST (LKW 0-4.5h, LVO 0-24h) Result Date: 01/18/2024 EXAM: CT HEAD WITHOUT 01/18/2024 05:05:43 PM TECHNIQUE: CT of the head was performed without the administration of intravenous contrast. Automated exposure control, iterative reconstruction, and/or weight based adjustment of the mA/kV was utilized to reduce the radiation dose to as low as reasonably achievable. COMPARISON: Head MRI 03/15/2023 and CTA 03/14/2023. CLINICAL HISTORY: Neuro deficit, acute, stroke suspected. Left sided weakness, blurred vision, and headache. History of stroke. FINDINGS: BRAIN AND VENTRICLES: There is no evidence of an acute infarct, intracranial hemorrhage, mass, midline shift, hydrocephalus, or extra-axial fluid collection. Cerebral volume is normal. A mildly enlarged partially empty sella is unchanged. ORBITS: No acute abnormality. SINUSES AND MASTOIDS: No acute abnormality. SOFT TISSUES AND SKULL: No acute skull fracture. No acute soft tissue abnormality. [place] Stroke Program Early CT Score (ASPECTS) ----- Ganglionic (caudate, IC, lentiform nucleus, insula, M1-M3): 7 Supraganglionic (M4-M6): 3 Total: 10 IMPRESSION: 1. No acute intracranial abnormality. ASPECTS of 10. 2. These results were communicated to Dr. FREDRIK Hock at 5:16 PM on 01/18/2024 by secure text page via the [facility] messaging system. Electronically signed by: Dasie Hamburg MD 01/18/2024 05:20 PM EST RP Workstation: [id number]    Vitals:   01/19/24 1200 01/19/24 1230 01/19/24 1300 01/19/24 1330  BP: (!) 116/98 135/68 (!) 127/103 120/86  Pulse: 85 83 73 68  Resp: (!) 21 15 19   (!) 21  Temp:      TempSrc:      SpO2: 98% 98% 97% 96%  Weight:       PHYSICAL EXAM General:  Alert, well-nourished, well-developed patient in no acute distress CV: Regular rate and rhythm on monitor Respiratory:  Regular, unlabored respirations on room air  NEURO:  Mental Status: AA&Ox3, patient is able to give clear and coherent history Speech/Language: NO aphaisa. There is some occasional paucity of speech with inconsistent exam.   Naming, repetition, fluency, and comprehension intact.  Cranial Nerves:  II: PERRL. Visual fields full.  III, IV, VI: EOMI. Eyelids elevate symmetrically.  V: Sensation is intact to light touch and symmetrical to face.  VII: Face is symmetrical resting and smiling VIII: hearing intact to voice. IX, X: Palate elevates symmetrically. Phonation is normal.  KP:Dynloizm shrug 5/5. XII: tongue is midline without fasciculations. Motor: 5/5 strength to all muscle groups tested.  Tone: is normal and bulk is normal Sensation- Inconsistent exam with some noted ?transient left-sided numbness. Vibration sensation noted by patient to change at midline of forehead.  Coordination:  FTN intact bilaterally, HKS: no ataxia in BLE.No drift.  Gait- deferred  Most Recent NIH: 2.     ASSESSMENT/PLAN  Angel Price is a 58 y.o. female with history of CVA, HTN, CAD, DLE, migraines, anxiety/depression who presented 11/2 with left-sided weakness and headache, bilateral visual changes.  Administered TNK.  Patient endorses history of stroke, no previous imaging seen on chart review.  She was admitted for post-TNK monitoring and further secondary stroke prevention workup.  NIH on admission: 5.    Strokelike episode -complicated migraine versus acute ischemic stroke versus conversion disorder treated with IV TNK CT Head without contrast(Personally reviewed): Negative CT angio Head and Neck with contrast(Personally reviewed): No LVO MRI: PENDING 2D Echo: EF  60-65% LDL 142 HgbA1c 5.4 VTE prophylaxis - SCDs, will start lovenox  if post-TNK imaging is negative for hemorrhage.  aspirin  81 mg daily prior to admission, now on No antithrombotic pending 24hour post-TNK imaging. Further recommendations based on Imaging results.  Therapy recommendations:  Pending Disposition:  pending  Hx of Stroke/TIA Patient states she has history of stroke and TIAs, unable to locate imaging or other chart notes.  Normal MRI 2013 ([facility])  Hypertension Home meds:  Amlodipine  10 mg daily, atenolol  50 mg daily Stable Blood Pressure Goal: BP less than 180/105   Hyperlipidemia Home meds: none LDL 142, goal < 70 Add Lipitor 40mg   Continue statin at discharge  Diabetes type  Home meds: None HgbA1c 5.4, goal < 7.0 CBGs SSI Recommend close follow-up with PCP for better DM control  Tobacco Abuse Patient smokes 0.5 packs per day        Ready to quit? Yes Nicotine  replacement therapy provided   Other Stroke Risk Factors ETOH use, alcohol level <15, advised to drink no more than 1-2 drink(s) a day Obesity, Body mass index is 28.9 kg/m., BMI >/= 30 associated with increased stroke risk, recommend weight loss, diet and exercise as appropriate  Coronary artery disease Migraines   Other Active Problems History of lupus PTA meds: Oxycodone  5 mg every 6 as needed, tizanidine 2 mg nightly, gabapentin  100 mg 3 times daily, Cymbalta  30 mg twice daily  Hospital day # 1  I have personally obtained history,examined this patient, reviewed notes, independently viewed imaging studies, participated in medical decision making and plan of care.ROS completed by me personally and pertinent positives fully documented  I have made any additions or clarifications directly to the above note. Agree with note above.  Patient presented with headache followed by left eye monocular painless vision loss and left face and body paresthesias which we have fleeting.  She states she was  improved only after TNK injection.  Neurological exam this morning is nonfocal except for nonorganic left hemibody sensory loss including splitting the forehead for vibration.  Continues close neurological observation and strict blood pressure control as per post TNK protocol.  Check MRI scan of the brain later today.  Mobilize out of bed.  Therapy consults.  Continue ongoing stroke workup. This patient is critically ill and at significant risk of neurological worsening, death and care requires constant monitoring of vital signs, hemodynamics,respiratory and cardiac monitoring, extensive review of multiple databases, frequent neurological assessment, discussion with family, other specialists and medical decision making of high complexity.I have made any additions or clarifications directly to the above note.This critical care time does not reflect procedure time, or teaching time or supervisory time of PA/NP/Med Resident etc but could involve care discussion time.  I spent 35 minutes of  neurocritical care time  in the care of  this patient.     Eather Popp, MD Medical Director [facility] Pager: [phone] 01/19/2024 3:36 PM   To contact Stroke Continuity provider, please refer to [email or web address]. After hours, contact General Neurology

## 2024-01-19 NOTE — Progress Notes (Signed)
 PT Cancellation Note  Patient Details Name: Angel Price MRN: [id number] DOB: 03-21-1965   Cancelled Treatment:    Reason Eval/Treat Not Completed: (P) Active bedrest order;Patient not medically ready Patient with active bed rest orders, as well as TNK administered at 1740 on 01/18/24. Per therapy protocol, PT is to wait 24 hours status post TNK administration unless otherwise approved by neurology. PT will follow back when medically appropriate.    Hodge Stachnik B. Fleeta Lapidus PT, DPT Acute Rehabilitation Services Please use secure chat or  Call Office [phone]   Almarie KATHEE Fleeta [facility] 01/19/2024, 8:53 AM

## 2024-01-19 NOTE — TOC CM/SW Note (Signed)
 Transition of Care [facility]) - Inpatient Brief Assessment   Patient Details  Name: Angel Price MRN: [id number] Date of Birth: November 19, 1965  [facility] CM/SW Contact:    Tom-Johnson, Harvest Muskrat, RN Phone Number: 01/19/2024, 12:51 PM   Clinical Narrative:  Patient presented to the [facility] ED from [place] with Lt-sided Weakness, Numbness and Blurred Vision to [employer]. Patient has hx of Lupus, Stroke, HTN.  TNK admitted at 1740. Patient transferred to [facility] for further management. Neurology following. MRI scheduled for this evening.   CM spoke with patient at bedside about needs for post hospital transition. Patient lives alone, has three supportive children. Has a personal caregiver 7 days/week from [employer]. Not employed, on disability. Does not drive, uses Medicaid transportation to and from appointments. Has a Rollator at home.  PCP is Fanta, Benita Area, MD and uses [facility] on [address] in [place].   Awaiting PT/OT eval for disposition.   Patient not Medically ready for discharge.  CM will continue to follow as patient progresses with care towards discharge.             Transition of Care Asessment: Insurance and Status: Insurance coverage has been reviewed Patient has primary care physician: Yes Home environment has been reviewed: Yes Prior level of function:: Modified Independent Prior/Current Home Services: Current home services (Personal Care Services.) Social Drivers of Health Review: SDOH reviewed no interventions necessary Readmission risk has been reviewed: Yes Transition of care needs: no transition of care needs at this time

## 2024-01-20 ENCOUNTER — Other Ambulatory Visit (HOSPITAL_COMMUNITY): Payer: Self-pay

## 2024-01-20 DIAGNOSIS — R2990 Unspecified symptoms and signs involving the nervous system: Secondary | ICD-10-CM | POA: Diagnosis not present

## 2024-01-20 MED ORDER — SODIUM CHLORIDE 0.9 % IV BOLUS
500.0000 mL | Freq: Once | INTRAVENOUS | Status: AC
  Administered 2024-01-20: 500 mL via INTRAVENOUS

## 2024-01-20 MED ORDER — PANTOPRAZOLE SODIUM 40 MG PO TBEC: 40.0000 mg | DELAYED_RELEASE_TABLET | Freq: Every day | ORAL | Status: DC

## 2024-01-20 MED ORDER — ATORVASTATIN CALCIUM 40 MG PO TABS
40.0000 mg | ORAL_TABLET | Freq: Every day | ORAL | 0 refills | Status: DC
  Filled 2024-01-20: qty 60, 60d supply, fill #0

## 2024-01-20 MED ORDER — BUTALBITAL-APAP-CAFFEINE 50-325-40 MG PO TABS
1.0000 | ORAL_TABLET | Freq: Four times a day (QID) | ORAL | 0 refills | Status: DC | PRN
  Filled 2024-01-20: qty 6, 2d supply, fill #0

## 2024-01-20 MED ORDER — NICOTINE 14 MG/24HR TD PT24
14.0000 mg | MEDICATED_PATCH | Freq: Every day | TRANSDERMAL | 0 refills | Status: DC
  Filled 2024-01-20: qty 28, 28d supply, fill #0

## 2024-01-20 MED ORDER — BUTALBITAL-APAP-CAFFEINE 50-325-40 MG PO TABS
1.0000 | ORAL_TABLET | Freq: Four times a day (QID) | ORAL | Status: DC | PRN
  Administered 2024-01-20: 1 via ORAL
  Filled 2024-01-20: qty 1

## 2024-01-20 MED ORDER — POTASSIUM CHLORIDE 20 MEQ PO PACK
40.0000 meq | PACK | Freq: Once | ORAL | Status: AC
  Administered 2024-01-20: 40 meq via ORAL
  Filled 2024-01-20: qty 2

## 2024-01-20 MED ORDER — MAGNESIUM SULFATE 2 GM/50ML IV SOLN
2.0000 g | Freq: Once | INTRAVENOUS | Status: DC
  Filled 2024-01-20: qty 50

## 2024-01-20 NOTE — Progress Notes (Signed)
 Office Visit Note  Patient: Angel Price             Date of Birth: Jul 25, 1965           MRN: [id number]             PCP: Carlette Benita Area, MD Referring: Carlette Benita Area* Visit Date: 02/03/2024   Subjective:  Discussed the use of AI scribe software for clinical note transcription with the patient, who gave verbal consent to proceed.  History of Present Illness   Angel Price is a 58 y.o. female here for follow up with rheumatoid arthritis on hydroxychloroquine  300 mg daily.  She had a recent hospital visit for vision changes and headache.  She experiences joint pain primarily in her hands and knees, with intermittent swelling affecting various areas. The pain is not significantly limiting her activity level, although she does experience some limitations. She has been taking Plaquenil  and had a short course of steroids previously, but no significant changes in her medication regimen were made during her last visit.  She recently had a hospital visit due to vision changes and a headache, which led to concerns about possible clots. An MRI of her head on November 3rd showed chronic microvascular changes. She is scheduled to follow up with a neurologist to monitor her condition.  No recent viral infections or the need for antibiotics. She does not go out much, especially in cooler weather, which may be affecting her arthritis symptoms. She reports dry skin but does not use any moisturizing products.       Previous HPI 11/03/2023 Angel Price is a 58 y.o. female here for follow up with rheumatoid arthritis on hydroxychloroquine  300 mg daily.     She has been experiencing significant issues with her knees, describing them as 'popping off' and attributing some of her joint pain to recent weight gain. Se has gained about 15 pounds compared to earlier this year, which she believes is affecting her knees and breathing. She had previous back injections did not provide  relief. She is scheduled for a nerve ablation procedure on August 25th.   Her hip is currently her biggest problem, with pain that she suspects may be related to her back. She has been experiencing issues with her left arm for the past couple of months, noting that she sometimes has to move it manually because it won't move on its own. She experiences tingling in her hand, particularly in her fingers, which she describes as feeling 'like they sleep.' This occurs daily and sometimes wakes her up at night. No previous diagnosis of carpal tunnel syndrome.   She has a history of rheumatoid arthritis and has been on hydroxychloroquine . She previously tried oral prednisone . Her recent blood work showed slightly low potassium levels, which she attributes to a diuretic she was taken off of.    Recent labs including CBC and BMP reviewed with hypokalemia but renal function normal.     Previous HPI 05/05/2023 Angel Price is a 58 year old female with rheumatoid arthritis on hydroxychloroquine  300 mg daily. She presents for routine follow up but with worsening joint pain and swelling.   She has been experiencing swelling and pain in her hands and hip for the past three weeks. The swelling affects both hands equally, causing difficulty in gripping objects. The hip pain is constant and not associated with specific movements or positions.   She notes numbness in her fingers, which is a new  symptom accompanying the joint pain and swelling.   She mentions soreness in her left knee, particularly on the side of the hip, but does not experience pain radiating to the front of the leg.   She has a history of rheumatoid arthritis and is currently taking Plaquenil . She has not been using any additional medications for joint pain or inflammation recently. She recalls a previous episode of inflammation for which she was prescribed a steroid, which she found helpful. No recent illness or swelling in her feet or  ankles.         Previous HPI 10/31/22 Angel Price is a 58 y.o. female here for follow up for seropositive RA on hydroxychloroquine  300 mg daily.  Since her last visit she is doing worse significant increase in joint pains mostly the hands and knees.  She sees visible swelling affecting her hands most days somewhat worse on the left side.  Knee pain and swelling is also worse in the left side.  Not taking any specific medications for the symptoms.  She saw the emergency department at the end of February for radiating left arm pain thought consistent for neuritis treated with a short course of oral prednisone  and oxycodone .  She did not recall any new preceding illness or injury and has been taking hydroxychloroquine  consistently.  She is smoking cigarettes.   Previous HPI 05/02/2022 Angel Price is a 58 y.o. female here for follow up for seropositive RA on hydroxychloroquine  300 mg daily.  Since her last visit she has been doing pretty well not having as much persistent hip or knee pain compared to what she felt in December.  Has not experienced any major flareups with peripheral joint swelling.  She denies any serious infections since her last visit.   Previous HPI 03/06/22 Angel Price is a 58 y.o. female here for follow up for seropositive rheumatoid arthritis (RF+, ANA+) on HCQ 300 mg daily. She is experiencing increased pain in bilateral hips and knees with a lot of associated joint popping and feeling of weakness or instability in her legs. This started since about a month ago and she does not recall any events, illness, or medical changes at the time. Not seeing visible swelling in the areas. She has not fallen. She reports normal feeling in her legs.  She is not taking any specific treatment for these symptoms besides her normal arthritis medication and pain medication. She has not taken any blood pressure medications today and says she never takes these until eating and plans to at  lunch.     Previous HPI 12/08/20 Angel Price is a 58 y.o. female here for lupus. 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 Previous serology showed positive ANA and RF. Past imaging showing degenerative changes in lumbar spine.  She has not followed up with any rheumatologist for many years. She recalls use of NSAIDs and steroids and thinks plaquenil  sounds familiar but not sure of an other long term lupus treatment. 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  She is currently following with cardiology for angina loss of consciousness no obstructive CAD on extensive work-up including coronary angiography attributed to suspected microvascular dysfunction. She is required abdominal surgeries for gastrointestinal bleeding with dieulafoy lesion of duodenum.  Abdominal mesh surgery for repair of incisional hernia was complicated with  infection attributed in part to continue nicotine  abuse.   Review of Systems  Constitutional:  Positive for fatigue.  HENT:  Negative for mouth sores and mouth dryness.   Eyes:  Negative for dryness.  Respiratory:  Negative for shortness of breath.   Cardiovascular:  Positive for palpitations. Negative for chest pain.  Gastrointestinal:  Negative for blood in stool, constipation and diarrhea.  Endocrine: Negative for increased urination.  Genitourinary:  Negative for involuntary urination.  Musculoskeletal:  Positive for joint pain, gait problem, joint pain, joint swelling, myalgias, muscle weakness, morning stiffness, muscle tenderness and myalgias.  Skin:  Negative for color change, rash, hair loss and sensitivity to sunlight.  Allergic/Immunologic: Positive for susceptible to infections.  Neurological:  Negative for dizziness and headaches.  Hematological:   Negative for swollen glands.  Psychiatric/Behavioral:  Positive for depressed mood and sleep disturbance. The patient is not nervous/anxious.     PMFS History:  Patient Active Problem List   Diagnosis Date Noted   Stroke-like episode 01/20/2024   Carpal tunnel syndrome of left wrist 01/09/2024   Acute lower GI bleeding 12/16/2023   B12 deficiency 11/26/2023   Iron deficiency 11/26/2023   Chest pain 08/21/2023   Dyslipidemia 08/21/2023   Rectal pressure 08/05/2023   Abdominal bloating 08/05/2023   Anemia 08/05/2023   High risk medication use 05/05/2023   Unilateral primary osteoarthritis, left knee 05/05/2023   Seropositive rheumatoid arthritis (HCC) 05/02/2022   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   Cellulitis 10/06/2021   Paroxysmal atrial fibrillation (HCC) 10/06/2021   Abdominal fluid collection 10/06/2021   Constipation 09/14/2021   Ventral hernia 05/15/2021   Dysphagia 05/15/2021   Cigarette nicotine  dependence without complication 09/12/2020   Continuous dependence on cigarette smoking 08/22/2020   Mixed hyperlipidemia 04/27/2020   Recurrent incisional hernia 02/03/2020   Bilateral knee pain 12/29/2019   Coronary artery disease of native artery of native heart with stable angina pectoris 06/25/2019   Acute respiratory failure with hypoxia (HCC) 05/20/2019   Essential hypertension 05/20/2019   COVID-19 05/20/2019   Syncope and collapse 05/20/2019   Metabolic acidosis 05/16/2019   Intractable vomiting with nausea 02/23/2019   Cocaine abuse (HCC)    Colitis 11/11/2018   Hemorrhoidal skin tag    Folate deficiency 05/11/2017   Abdominal wall fistula    Cellulitis of abdominal wall 01/02/2017   Acute left hemiparesis (HCC) 12/05/2016   Malingering 12/05/2016   Weakness of left lower extremity 12/02/2016   CVA (cerebral vascular accident) (HCC) 11/23/2016   Bright red rectal bleeding 08/22/2016   Hypotension due to blood loss    Acute GI bleeding  05/24/2016   Dieulafoy lesion of duodenum    History of Billroth II operation    Gastrointestinal hemorrhage 05/22/2016   Absolute anemia    Acute pancreatitis 10/01/2015   Alcohol abuse with intoxication    Left-sided weakness    Psychosomatic factor in physical condition    Upper GI bleed    Diverticulosis of colon with hemorrhage    Chronic wound infection of abdomen 12/22/2014   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   HTN (hypertension) 12/22/2014   Ataxia 11/01/2014   Hemorrhoids 04/26/2014   Lung nodule 04/26/2014   Diverticulosis    Gastritis    Hiatal hernia    Schatzki's ring    Acute blood loss anemia 04/25/2014   Hypokalemia 04/25/2014   Hematemesis with nausea 04/25/2014   Lung nodule < 6cm on CT 04/25/2014   Intractable nausea and vomiting 04/24/2014   Gastroparesis  Abdominal pain 04/01/2014   Gastroenteritis 12/10/2013   Chronic abdominal wound infection 08/16/2013   MDD (major depressive disorder), recurrent episode, severe (HCC) 06/27/2013   Wrist laceration 06/24/2013   Diarrhea 05/07/2013   Rectal bleeding 05/07/2013   Adrenal mass, left 05/07/2013   Post-operative wound abscess 04/19/2013   Abdominal wall abscess 04/18/2013   Essential hypertension, benign 03/30/2013   History of cocaine abuse (HCC) 03/16/2013   History of schizoaffective disorder 03/16/2013   Bipolar disorder (HCC) 03/16/2013   Personality disorder (HCC) 03/16/2013   Tobacco use disorder 03/16/2013   Alcohol abuse 03/16/2013   Palpitations 03/16/2013   Poor vision 03/16/2013   History of gastric bypass 03/16/2013   Status post hysterectomy with oophorectomy 03/16/2013   Hypothyroid 03/16/2013   SLE (systemic lupus erythematosus) (HCC) 03/16/2013    Past Medical History:  Diagnosis Date   Abscess    soft tissue   Adrenal mass    Alcohol abuse    Anemia    Anxiety    Arrhythmia    Blood transfusion without reported diagnosis    Chronic abdominal pain    Chronic wound infection  of abdomen    Colon polyp    colonoscopy 04/2014   Coronary artery disease    Mild non obstr CAD   Depression    Discoid lupus erythematosus    Diverticulosis    colonoscopy 04/2014 moderat pan colonic   Drug-seeking behavior    Gastritis    EGD 05/2014   Gastroparesis 01/2014   GERD (gastroesophageal reflux disease)    Hemorrhoid    internal large   Hiatal hernia    History of Billroth II operation    Hypertension    Lung nodule    CT 02/2014 needs repeat 1 month   Lung nodule < 6cm on CT 04/25/2014   Lupus    Malingering    Migraine    Nausea and vomiting    chronic, recurrent   Pancreatitis    Schatzki's ring    patent per EGD 04/2014   Sickle cell trait    Stroke (HCC)    Suicide attempt (HCC)    Thyroid  disease 2000   overactive, radiation    Family History  Problem Relation Age of Onset   Drug abuse Mother    Anemia Mother    Arrhythmia Mother    Clotting disorder Mother    Heart disease Mother    Heart failure Mother    Hypertension Mother    Lung cancer Father    Cancer Father        mets   Drug abuse Sister    Drug abuse Brother    Breast cancer Maternal Aunt    Bipolar disorder Maternal Aunt    Drug abuse Maternal Aunt    Colon cancer Maternal Grandmother        late 43s, early 57s   Bipolar disorder Paternal Grandfather    Brain cancer Son    Schizophrenia Son    Cancer Son        brain   Bipolar disorder Cousin    Liver disease Neg Hx    Past Surgical History:  Procedure Laterality Date   ABDOMINAL HYSTERECTOMY  2013   [place]   ABDOMINAL HYSTERECTOMY     ABDOMINAL SURGERY     ADRENALECTOMY Right    AGILE CAPSULE N/A 01/05/2015   Procedure: AGILE CAPSULE;  Surgeon: Lamar CHRISTELLA Hollingshead, MD;  [facility]: [facility];  Service: Endoscopy;  Laterality: N/A;  0700   BALLOON DILATION N/A 10/01/2021   Procedure: BALLOON DILATION;  Surgeon: Cindie Carlin POUR, DO;  Location: [facility];  Service: Endoscopy;  Laterality: N/A;   Billroth II  procedure      [place], first 2000, 2005/2006.   bilroth 2     BIOPSY  05/20/2013   Procedure: BIOPSIES OF ASCENDING AND SIGMOID COLON;  Surgeon: Lamar CHRISTELLA Hollingshead, MD;  Location: [facility];  Service: Endoscopy;;   BIOPSY  04/26/2014   Procedure: BIOPSIES;  Surgeon: Margo LITTIE Haddock, MD;  Location: [facility];  Service: Endoscopy;;   BIOPSY  12/24/2018   Procedure: BIOPSY;  Surgeon: Hollingshead Lamar CHRISTELLA, MD;  [facility];  Service: Endoscopy;;  colon   CARPAL TUNNEL RELEASE Left 01/09/2024   Procedure: RELEASE, CARPAL TUNNEL, ENDOSCOPIC;  Surgeon: Arlinda Buster, MD;  Location: [facility];  Service: Orthopedics;  Laterality: Left;   CHOLECYSTECTOMY     COLONOSCOPY     in [place]   COLONOSCOPY N/A 12/16/2023   Procedure: COLONOSCOPY;  Surgeon: Cindie Carlin POUR, DO;  [facility]: [facility];  Service: Endoscopy;  Laterality: N/A;  145pm, ok rm 1   COLONOSCOPY WITH PROPOFOL  N/A 05/20/2013   Dr.Rourk- inadequate prep, normal appearing rectum, grossly normal colon aside from pancolonic diverticula, normal terminal ileum bx= unremarkable colonic mucosa. Due for early interval 2016.    COLONOSCOPY WITH PROPOFOL  N/A 04/26/2014   DOQ:wnmfjo ileum/one colon polyp removed/moderate pan-colonic diverticulosis/large internal hemorrhoids   COLONOSCOPY WITH PROPOFOL  N/A 12/23/2014   Dr.Rourk- minimal internal hemorrhoids, pancolonic diverticulosis   COLONOSCOPY WITH PROPOFOL  N/A 08/23/2016   Procedure: COLONOSCOPY WITH PROPOFOL ;  Surgeon: Haddock Margo LITTIE, MD;  [facility];  Service: Endoscopy;  Laterality: N/A;   COLONOSCOPY WITH PROPOFOL  N/A 12/24/2018   Pancolonic diverticulosis, normal TI, one 5 mm polyp in rectum (tubular adenoma). Somewhat friable hemorrhagic mucosa in area of IC valve/cecum, possibly related to scope trauma s/p biopsy. Repeat in 7 years.   DEBRIDEMENT OF ABDOMINAL WALL ABSCESS N/A 02/08/2013   Procedure: DEBRIDEMENT OF ABDOMINAL WALL ABSCESS;  Surgeon: Oneil DELENA Budge, MD;  Location: [facility];  Service: General;  Laterality: N/A;   ESOPHAGEAL DILATION N/A 12/16/2023   Procedure: DILATION, ESOPHAGUS;  Surgeon: Cindie Carlin POUR, DO;  [facility];  Service: Endoscopy;  Laterality: N/A;   ESOPHAGOGASTRODUODENOSCOPY N/A 12/16/2023   Procedure: EGD (ESOPHAGOGASTRODUODENOSCOPY);  Surgeon: Cindie Carlin POUR, DO;  [facility];  Service: Endoscopy;  Laterality: N/A;   ESOPHAGOGASTRODUODENOSCOPY (EGD) WITH PROPOFOL  N/A 05/20/2013   Dr.Rourk- s/p prior gastric surgery with normal esophagus, residual gastric mucosa and patent efferent limb   ESOPHAGOGASTRODUODENOSCOPY (EGD) WITH PROPOFOL  N/A 02/03/2014   Dr. Hollingshead:  s/p hemigastrectomy with retained gastric contents. Residual gastric mucosa and efferent limb appeared normal otherwise. Query gastroparesis.    ESOPHAGOGASTRODUODENOSCOPY (EGD) WITH PROPOFOL  N/A 04/26/2014   DOQ:dryjusxp'd ring/small HH/mild non-erosive gasrtitis/normal anastomosis   ESOPHAGOGASTRODUODENOSCOPY (EGD) WITH PROPOFOL  N/A 12/23/2014   Dr.Rourk- s/p prior hemigastrctomy, active oozing from anastomotic suture site, hemostasis achieved   ESOPHAGOGASTRODUODENOSCOPY (EGD) WITH PROPOFOL  N/A 05/23/2016   Dr. 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    ESOPHAGOGASTRODUODENOSCOPY (EGD) WITH PROPOFOL  N/A 05/11/2017   Procedure: ESOPHAGOGASTRODUODENOSCOPY (EGD) WITH PROPOFOL ;  Surgeon: Hollingshead Lamar CHRISTELLA, MD;  [facility];  Service: Endoscopy;  Laterality: N/A;   ESOPHAGOGASTRODUODENOSCOPY (EGD) WITH PROPOFOL  N/A 06/07/2021   Procedure: ESOPHAGOGASTRODUODENOSCOPY (EGD) WITH PROPOFOL ;  Surgeon: Shaaron Lamar HERO, MD;  [facility];  Service: Endoscopy;  Laterality: N/A;  11:45am   ESOPHAGOGASTRODUODENOSCOPY (EGD) WITH PROPOFOL  N/A 10/01/2021   Procedure: ESOPHAGOGASTRODUODENOSCOPY (EGD) WITH PROPOFOL ;  Surgeon: Cindie Carlin POUR, DO;  [facility];  Service: Endoscopy;  Laterality: N/A;  3:00pm   EXCISION OF MESH  08/2019   FLEXIBLE SIGMOIDOSCOPY N/A 12/17/2023   Procedure: KINGSTON SIDE;  Surgeon: Shaaron Lamar HERO, MD;  [facility];  Service: Endoscopy;  Laterality: N/A;   HEMORRHOID SURGERY N/A 06/18/2017   Procedure: THREE COLUMN EXTENSIVE HEMORRHOIDECTOMY;  Surgeon: Kallie Manuelita BROCKS, MD;  Location: [facility];  Service: General;  Laterality: N/A;   HERNIA REPAIR     INCISION AND DRAINAGE ABSCESS N/A 01/06/2017   Procedure: INCISION AND DRAINAGE ABDOMINAL WALL ABSCESS;  Surgeon: Mavis Anes, MD;  Location: [facility];  Service: General;  Laterality: N/A;   incisional hernial repair  09/05/2021   [place]   LEFT HEART CATH AND CORONARY ANGIOGRAPHY N/A 05/09/2020   Procedure: LEFT HEART CATH AND CORONARY ANGIOGRAPHY;  Surgeon: Elmira Newman PARAS, MD;  Location: [facility];  Service: Cardiovascular;  Laterality: N/A;   LEFT HEART CATH AND CORONARY ANGIOGRAPHY N/A 08/22/2023   Procedure: LEFT HEART CATH AND CORONARY ANGIOGRAPHY;  Surgeon: Verlin Lonni BIRCH, MD;  Location: [facility];  Service: Cardiovascular;  Laterality: N/A;   POLYPECTOMY  12/24/2018   Procedure: POLYPECTOMY;  Surgeon: Shaaron Lamar HERO, MD;  [facility];  Service: Endoscopy;;  colon   tendon repar Right    wrist   WOUND EXPLORATION Right 06/24/2013   Procedure: exploration of traumatic wound right wrist;  Surgeon: Franky JONELLE Curia, MD;  [facility];  Service: Orthopedics;  Laterality: Right;   Social History   Social History Narrative   Caffeine - tea occass   Immunization History  Administered Date(s) Administered   Influenza,inj,Quad PF,6+ Mos 02/08/2013, 04/02/2014, 12/23/2014, 12/03/2016   Influenza-Unspecified 02/08/2013, 04/02/2014, 12/23/2014, 12/03/2016   Pneumococcal Polysaccharide-23 06/25/2013, 12/03/2016, 11/12/2018   Tdap 03/16/2013, 06/24/2013      Objective: Vital Signs: BP 90/65   Pulse 74   Temp (!) 96.4 F (35.8 C)   Resp 16   Ht 5' 3 (1.6 m)   Wt 167 lb 9.6 oz (76 kg)   LMP  (LMP Unknown)   BMI 29.69 kg/m    Physical Exam Eyes:     Conjunctiva/sclera: Conjunctivae normal.  Cardiovascular:     Rate and Rhythm: Normal rate and regular rhythm.  Pulmonary:     Effort: Pulmonary effort is normal.     Breath sounds: Normal breath sounds.  Lymphadenopathy:     Cervical: No cervical adenopathy.  Skin:    General: Skin is warm and dry.  Neurological:     Mental Status: She is alert.  Psychiatric:        Mood and Affect: Mood normal.      Musculoskeletal Exam:  Shoulders full ROM no tenderness or swelling Elbows full ROM no tenderness or swelling Wrists full ROM no tenderness or swelling Fingers full ROM, MCP joint tenderness worst in 1st through 3rd digits on both hands without palpable synovitis Widespread paraspinal and midline back tenderness to pressure, no radiation Knees full ROM no swelling, left knee tenderness to pressure Ankles full ROM no tenderness or swelling   Investigation: No additional findings.  Imaging: MR BRAIN WO CONTRAST Result Date: 01/20/2024 EXAM: MRI BRAIN WITHOUT CONTRAST 01/19/2024 05:41:42 PM TECHNIQUE: Multiplanar multisequence  MRI of the head/brain was performed without the administration of intravenous contrast. COMPARISON: MR Head without Contrast 03/15/2023, head CT 01/19/2024 and 01/18/2024, CTA head and neck 01/18/2024. CLINICAL HISTORY: 58 year old female. Code stroke presentation on 01/18/2024. FINDINGS: BRAIN AND VENTRICLES: No acute infarct. No intracranial hemorrhage. No mass. No midline shift. No hydrocephalus. Stable brain volume since last year. Chronic partially empty sella. Elnor and white matter signal stable and normal for age. No cortical and subtle malacia. No chronic cerebral blood products on SWI. Normal flow voids. ORBITS: No acute abnormality. SINUSES AND  MASTOIDS: No acute abnormality. BONES AND SOFT TISSUES: Normal marrow signal. No acute soft tissue abnormality. IMPRESSION: 1. No acute intracranial abnormality. 2. Stable since last year and negative for age non-contrast MRI appearance of the brain. Electronically signed by: Helayne Hurst MD 01/20/2024 04:09 AM EST RP Workstation: [id number]   ECHOCARDIOGRAM COMPLETE Result Date: 01/19/2024    ECHOCARDIOGRAM REPORT   Patient Name:   MLISSA TAMAYO Date of Exam: 01/19/2024 Medical Rec #:  [id number]        Height:       63.0 in Accession #:    [id number]       Weight:       163.1 lb Date of Birth:  [birth year]        BSA:          1.773 m Patient Age:    58 years         BP:           108/72 mmHg Patient Gender: F                HR:           73 bpm. Exam Location:  Inpatient Procedure: 2D Echo, Cardiac Doppler and Color Doppler (Both Spectral and Color            Flow Doppler were utilized during procedure). Indications:    Stroke I63.9  History:        Patient has prior history of Echocardiogram examinations, most                 recent 02/23/2023. CAD, Stroke, Arrythmias:Palpitations,                 Signs/Symptoms:Syncope; Risk Factors:Hyperlipidemia.  Sonographer:    BERNARDA ROCKS Referring Phys: ANIUS.BANISTER MCNEILL P KIRKPATRICK IMPRESSIONS  1. Left ventricular ejection fraction, by estimation, is 60 to 65%. Left ventricular ejection fraction by 2D MOD biplane is 68.3 %. The left ventricle has normal function. The left ventricle has no regional wall motion abnormalities. Left ventricular diastolic parameters were normal.  2. Right ventricular systolic function is normal. The right ventricular size is normal.  3. The mitral valve is normal in structure. No evidence of mitral valve regurgitation. No evidence of mitral stenosis.  4. The aortic valve is normal in structure. Aortic valve regurgitation is not visualized. No aortic stenosis is present.  5. The inferior vena cava is normal in size with <50% respiratory  variability, suggesting right atrial pressure of 8 mmHg. Comparison(s): Compared to prior echo, no significant changes noted. FINDINGS  Left Ventricle: Left ventricular ejection fraction, by estimation, is 60 to 65%. Left ventricular ejection fraction by 2D MOD biplane is 68.3 %. The left ventricle has normal function. The left ventricle has no regional wall motion abnormalities. The left ventricular internal cavity size was normal in size. There is no left ventricular hypertrophy. Left ventricular diastolic parameters were normal. Right Ventricle: The  right ventricular size is normal. No increase in right ventricular wall thickness. Right ventricular systolic function is normal. Left Atrium: Left atrial size was normal in size. Right Atrium: Right atrial size was normal in size. Pericardium: There is no evidence of pericardial effusion. Mitral Valve: The mitral valve is normal in structure. No evidence of mitral valve regurgitation. No evidence of mitral valve stenosis. MV peak gradient, 5.3 mmHg. The mean mitral valve gradient is 2.0 mmHg. Tricuspid Valve: The tricuspid valve is normal in structure. Tricuspid valve regurgitation is not demonstrated. No evidence of tricuspid stenosis. Aortic Valve: The aortic valve is normal in structure. Aortic valve regurgitation is not visualized. No aortic stenosis is present. Aortic valve mean gradient measures 4.0 mmHg. Aortic valve peak gradient measures 8.1 mmHg. Aortic valve area, by VTI measures 3.85 cm. Pulmonic Valve: The pulmonic valve was normal in structure. Pulmonic valve regurgitation is not visualized. No evidence of pulmonic stenosis. Aorta: The aortic root is normal in size and structure. Venous: The inferior vena cava is normal in size with less than 50% respiratory variability, suggesting right atrial pressure of 8 mmHg. IAS/Shunts: No atrial level shunt detected by color flow Doppler.  LEFT VENTRICLE PLAX 2D                        Biplane EF (MOD) LVIDd:          4.60 cm         LV Biplane EF:   Left LVIDs:         2.70 cm                          ventricular LV PW:         0.90 cm                          ejection LV IVS:        1.00 cm                          fraction by LVOT diam:     2.40 cm                          2D MOD LV SV:         117                              biplane is LV SV Index:   66                               68.3 %. LVOT Area:     4.52 cm                                Diastology                                LV e' medial:    8.05 cm/s LV Volumes (MOD)               LV E/e' medial:  11.9 LV vol d, MOD    75.8 ml  LV e' lateral:   8.81 cm/s A2C:                           LV E/e' lateral: 10.9 LV vol d, MOD    82.8 ml A4C: LV vol s, MOD    22.6 ml A2C: LV vol s, MOD    29.0 ml A4C: LV SV MOD A2C:   53.2 ml LV SV MOD A4C:   82.8 ml LV SV MOD BP:    55.9 ml RIGHT VENTRICLE             IVC RV Basal diam:  2.90 cm     IVC diam: 1.40 cm RV S prime:     12.80 cm/s TAPSE (M-mode): 2.2 cm LEFT ATRIUM             Index        RIGHT ATRIUM           Index LA diam:        4.00 cm 2.26 cm/m   RA Area:     12.10 cm LA Vol (A2C):   51.5 ml 29.04 ml/m  RA Volume:   24.30 ml  13.70 ml/m LA Vol (A4C):   29.4 ml 16.58 ml/m LA Biplane Vol: 39.5 ml 22.28 ml/m  AORTIC VALVE                    PULMONIC VALVE AV Area (Vmax):    4.30 cm     PV Vmax:          1.12 m/s AV Area (Vmean):   3.84 cm     PV Peak grad:     5.0 mmHg AV Area (VTI):     3.85 cm     PR End Diast Vel: 2.17 msec AV Vmax:           142.00 cm/s AV Vmean:          89.200 cm/s AV VTI:            0.303 m AV Peak Grad:      8.1 mmHg AV Mean Grad:      4.0 mmHg LVOT Vmax:         135.00 cm/s LVOT Vmean:        75.800 cm/s LVOT VTI:          0.258 m LVOT/AV VTI ratio: 0.85  AORTA Ao Root diam: 3.40 cm Ao Asc diam:  3.30 cm MITRAL VALVE MV Area (PHT): 2.82 cm    SHUNTS MV Area VTI:   3.22 cm    Systemic VTI:  0.26 m MV Peak grad:  5.3 mmHg    Systemic Diam: 2.40 cm MV Mean grad:  2.0 mmHg MV  Vmax:       1.15 m/s MV Vmean:      63.1 cm/s MV Decel Time: 269 msec MV E velocity: 96.10 cm/s MV A velocity: 87.90 cm/s MV E/A ratio:  1.09 Franck Azobou Tonleu Electronically signed by Joelle Cedars Tonleu Signature Date/Time: 01/19/2024/11:59:57 AM    Final    CT HEAD WO CONTRAST Result Date: 01/19/2024 EXAM: CT HEAD WITHOUT CONTRAST 01/19/2024 12:28:00 AM TECHNIQUE: CT of the head was performed without the administration of intravenous contrast. Automated exposure control, iterative reconstruction, and/or weight based adjustment of the mA/kV was utilized to reduce the radiation dose to as low as reasonably achievable. COMPARISON: 01/18/2024 CLINICAL HISTORY: Stroke, follow up. FINDINGS: BRAIN AND VENTRICLES:  Empty sella noted, stable. No acute hemorrhage. No evidence of acute infarct. No hydrocephalus. No extra-axial collection. No mass effect or midline shift. ORBITS: No acute abnormality. SINUSES: No acute abnormality. SOFT TISSUES AND SKULL: No acute soft tissue abnormality. No skull fracture. IMPRESSION: 1. Stable head CT. No acute intracranial abnormality. Electronically signed by: Morene Hoard MD 01/19/2024 01:01 AM EST RP Workstation: [id number]   CT Head Wo Contrast Result Date: 01/18/2024 EXAM: CT HEAD WITHOUT CONTRAST 01/18/2024 06:56:13 PM TECHNIQUE: CT of the head was performed without the administration of intravenous contrast. Automated exposure control, iterative reconstruction, and/or weight based adjustment of the mA/kV was utilized to reduce the radiation dose to as low as reasonably achievable. COMPARISON: Earlier today. CLINICAL HISTORY: Headache, fever. FINDINGS: BRAIN AND VENTRICLES: No acute hemorrhage. No evidence of acute infarct. No hydrocephalus. No extra-axial collection. No mass effect or midline shift. ORBITS: No acute abnormality. SINUSES: No acute abnormality. SOFT TISSUES AND SKULL: No acute soft tissue abnormality. No skull fracture. IMPRESSION: 1. No acute  intracranial abnormality. Electronically signed by: Franky Crease MD 01/18/2024 07:06 PM EST RP Workstation: [id number]   CT ANGIO HEAD NECK W WO CM (CODE STROKE) Result Date: 01/18/2024 EXAM: CTA HEAD AND NECK WITHOUT AND WITH 01/18/2024 05:39:17 PM TECHNIQUE: CTA of the head and neck was performed without and with the administration of 75 mL of iohexol  (OMNIPAQUE ) 350 MG/ML intravenous contrast. Multiplanar 2D and/or 3D reformatted images are provided for review. Automated exposure control, iterative reconstruction, and/or weight based adjustment of the mA/kV was utilized to reduce the radiation dose to as low as reasonably achievable. Stenosis of the internal carotid arteries measured using NASCET criteria. COMPARISON: CTA head and neck 03/14/2023 CLINICAL HISTORY: Neuro deficit, acute, stroke suspected. Left sided weakness, blurry vision, and headache. FINDINGS: CTA NECK: AORTIC ARCH AND ARCH VESSELS: Normal variant aortic arch branching pattern with common origin of the brachiocephalic and left common carotid arteries. No dissection or arterial injury. No significant stenosis of the brachiocephalic or subclavian arteries. CERVICAL CAROTID ARTERIES: Minimal calcified plaque at the left carotid bifurcation. No dissection, arterial injury, or hemodynamically significant stenosis by NASCET criteria. CERVICAL VERTEBRAL ARTERIES: Codominant vertebral arteries. No dissection, arterial injury, or significant stenosis. LUNGS AND MEDIASTINUM: Motion artifact and mild atelectasis in the included upper lungs. SOFT TISSUES: No acute abnormality. BONES: Disc degeneration at C5-C6 and C6-C7. CTA HEAD: ANTERIOR CIRCULATION: The internal carotid arteries are patent from skull base to carotid termini with mild atherosclerosis not resulting in a significant stenosis. ACAs and MCAs are patent with branch vessel evaluation being mildly limited by venous contamination, however no discrete proximal branch occlusion or significant  proximal stenosis is identified. No aneurysm. POSTERIOR CIRCULATION: The intracranial vertebral arteries are widely patent to the basilar. Patent PICA and SCA origins are visualized bilaterally. The basilar artery is widely patent. There are right larger than left posterior communicating arteries. Both PCAs are patent without evidence of a significant proximal stenosis. No aneurysm. OTHER: No dural venous sinus thrombosis on this non-dedicated study. IMPRESSION: 1. Mild atherosclerosis without a large vessel occlusion or significant proximal stenosis in the head or neck. 2. These results were communicated to Dr. FREDRIK Hock at 5:51 PM on 01/18/2024 by secure text page via the [facility] messaging system. Electronically signed by: Dasie Hamburg MD 01/18/2024 05:59 PM EST RP Workstation: [id number]   CT HEAD CODE STROKE WO CONTRAST (LKW 0-4.5h, LVO 0-24h) Result Date: 01/18/2024 EXAM: CT HEAD WITHOUT 01/18/2024 05:05:43 PM TECHNIQUE: CT of the head was performed without  the administration of intravenous contrast. Automated exposure control, iterative reconstruction, and/or weight based adjustment of the mA/kV was utilized to reduce the radiation dose to as low as reasonably achievable. COMPARISON: Head MRI 03/15/2023 and CTA 03/14/2023. CLINICAL HISTORY: Neuro deficit, acute, stroke suspected. Left sided weakness, blurred vision, and headache. History of stroke. FINDINGS: BRAIN AND VENTRICLES: There is no evidence of an acute infarct, intracranial hemorrhage, mass, midline shift, hydrocephalus, or extra-axial fluid collection. Cerebral volume is normal. A mildly enlarged partially empty sella is unchanged. ORBITS: No acute abnormality. SINUSES AND MASTOIDS: No acute abnormality. SOFT TISSUES AND SKULL: No acute skull fracture. No acute soft tissue abnormality. [place] Stroke Program Early CT Score (ASPECTS) ----- Ganglionic (caudate, IC, lentiform nucleus, insula, M1-M3): 7 Supraganglionic (M4-M6): 3 Total: 10 IMPRESSION:  1. No acute intracranial abnormality. ASPECTS of 10. 2. These results were communicated to Dr. FREDRIK Hock at 5:16 PM on 01/18/2024 by secure text page via the [facility] messaging system. Electronically signed by: Dasie Hamburg MD 01/18/2024 05:20 PM EST RP Workstation: [id number]    Recent Labs: Lab Results  Component Value Date   WBC 6.7 01/19/2024   HGB 10.6 (L) 01/19/2024   PLT 433 (H) 01/19/2024   NA 140 01/19/2024   K 3.4 (L) 01/19/2024   CL 109 01/19/2024   CO2 21 (L) 01/19/2024   GLUCOSE 94 01/19/2024   BUN 5 (L) 01/19/2024   CREATININE 0.48 01/19/2024   BILITOT 0.5 01/18/2024   ALKPHOS 119 01/18/2024   AST 27 01/18/2024   ALT 24 01/18/2024   PROT 7.1 01/18/2024   ALBUMIN 4.3 01/18/2024   CALCIUM  9.0 01/19/2024   GFRAA 119 05/05/2020    Speciality Comments: PLQ Eye Exam 08/10/2021 [facility] f/u 1 month  Procedures:  No procedures performed Allergies: Metoclopramide  and Codeine   Assessment / Plan:     Visit Diagnoses: Seropositive rheumatoid arthritis (HCC) - Plan: hydroxychloroquine  (PLAQUENIL ) 200 MG tablet Intermittent joint pain in hands and knees with swelling. Recent hospitalization for vision changes and headache, possibly TIA. No persistent inflammation changes no deficits on exam today. Current regimen would not be a risk factor or need modification. - Continue Plaquenil  300 mg daily - Consider topical Voltaren for joint pain, avoiding orla NSAIDs.  Left shoulder impingement syndrome Pain and limited range of motion due to impingement. - Provided exercises to stabilize the rotator cuff and improve shoulder function.  Osteoarthritis Joint pain and stiffness in knees and hands, exacerbated by cold weather. No new damage on recent MRI. - Consider topical Voltaren for joint pain.        Orders: No orders of the defined types were placed in this encounter.  Meds ordered this encounter  Medications   hydroxychloroquine  (PLAQUENIL ) 200 MG  tablet    Sig: Take 1.5 tablets (300 mg total) by mouth daily.    Dispense:  135 tablet    Refill:  1     Follow-Up Instructions: Return in about 6 months (around 08/02/2024) for RA on HCQ f/u 6mos.   Lonni LELON Ester, MD  Note - This record has been created using [employer].  Chart creation errors have been sought, but may not always  have been located. Such creation errors do not reflect on  the standard of medical care.

## 2024-01-20 NOTE — TOC Transition Note (Signed)
 Transition of Care [facility]) - Discharge Note   Patient Details  Name: Angel Price MRN: [id number] Date of Birth: 06/05/1965  [facility] CM/SW Contact:  Tom-Johnson, Harvest Muskrat, RN Phone Number: 01/20/2024, 1:06 PM   Clinical Narrative:     Patient is scheduled for discharge today.  Readmission Risk Assessment done. Outpatient PT/OT/ST info, hospital f/u and discharge instructions on AVS. Prescriptions sent to [facility] pharmacy and patient will receive meds prior discharge. Personal Aide, Lizabeth to transport at discharge.  No further ICM needs noted.      Final next level of care: OP Rehab Barriers to Discharge: Barriers Resolved   Patient Goals and CMS Choice Patient states their goals for this hospitalization and ongoing recovery are:: To return home CMS Medicare.gov Compare Post Acute Care list provided to:: Patient Choice offered to / list presented to : Patient      Discharge Placement                Patient to be transferred to facility by: Personal Aide Name of family member notified: [facility]    Discharge Plan and Services Additional resources added to the After Visit Summary for                  DME Arranged: N/A DME Agency: NA       HH Arranged: NA HH Agency: NA        Social Drivers of Health (SDOH) Interventions SDOH Screenings   Food Insecurity: No Food Insecurity (12/17/2023)  Housing: Low Risk  (12/17/2023)  Transportation Needs: No Transportation Needs (12/17/2023)  Utilities: Not At Risk (12/17/2023)  Alcohol Screen: Low Risk  (03/15/2019)  Depression (PHQ2-9): Medium Risk (03/15/2019)  Financial Resource Strain: Low Risk  (09/11/2023)   Received from [facility]  Physical Activity: Unknown (09/11/2023)   Received [facility]  Social Connections: Socially Integrated (09/11/2023)   Received from [facility]  Stress: No Stress Concern Present (09/11/2023)   Received from [facility]  Tobacco Use: High Risk  (01/18/2024)     Readmission Risk Interventions    01/19/2024   12:50 PM 12/18/2023    9:58 AM 03/15/2023    2:12 PM  Readmission Risk Prevention Plan  Transportation Screening Complete Complete Complete  PCP or Specialist Appt within 3-5 Days Complete  Complete  HRI or Home Care Consult Complete Complete Complete  Social Work Consult for Recovery Care Planning/Counseling Complete Complete Complete  Palliative Care Screening Not Applicable Not Applicable Complete  Medication Review Oceanographer) Referral to Pharmacy Complete Complete

## 2024-01-20 NOTE — Evaluation (Signed)
 Speech Language Pathology Evaluation Patient Details Name: Angel Price MRN: [id number] DOB: 14-Feb-1966 Today's Date: 01/20/2024 Time: 8859-8794 SLP Time Calculation (min) (ACUTE ONLY): 25 min  Problem List:  Patient Active Problem List   Diagnosis Date Noted   Stroke (HCC) 01/18/2024   Stroke (cerebrum) (HCC) 01/18/2024   Carpal tunnel syndrome of left wrist 01/09/2024   Acute lower GI bleeding 12/16/2023   B12 deficiency 11/26/2023   Iron deficiency 11/26/2023   Chest pain 08/21/2023   Dyslipidemia 08/21/2023   Rectal pressure 08/05/2023   Abdominal bloating 08/05/2023   Anemia 08/05/2023   High risk medication use 05/05/2023   Unilateral primary osteoarthritis, left knee 05/05/2023   Seropositive rheumatoid arthritis (HCC) 05/02/2022   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   Cellulitis 10/06/2021   Paroxysmal atrial fibrillation (HCC) 10/06/2021   Abdominal fluid collection 10/06/2021   Constipation 09/14/2021   Ventral hernia 05/15/2021   Dysphagia 05/15/2021   Cigarette nicotine  dependence without complication 09/12/2020   Continuous dependence on cigarette smoking 08/22/2020   Mixed hyperlipidemia 04/27/2020   Recurrent incisional hernia 02/03/2020   Bilateral knee pain 12/29/2019   Coronary artery disease of native artery of native heart with stable angina pectoris 06/25/2019   Acute respiratory failure with hypoxia (HCC) 05/20/2019   Essential hypertension 05/20/2019   COVID-19 05/20/2019   Syncope and collapse 05/20/2019   Metabolic acidosis 05/16/2019   Intractable vomiting with nausea 02/23/2019   Cocaine abuse (HCC)    Colitis 11/11/2018   Hemorrhoidal skin tag    Folate deficiency 05/11/2017   Abdominal wall fistula    Cellulitis of abdominal wall 01/02/2017   Acute left hemiparesis (HCC) 12/05/2016   Malingering 12/05/2016   Weakness of left lower extremity 12/02/2016   CVA (cerebral vascular accident) (HCC) 11/23/2016   Bright red rectal  bleeding 08/22/2016   Hypotension due to blood loss    Acute GI bleeding 05/24/2016   Dieulafoy lesion of duodenum    History of Billroth II operation    Gastrointestinal hemorrhage 05/22/2016   Absolute anemia    Acute pancreatitis 10/01/2015   Alcohol abuse with intoxication    Left-sided weakness    Psychosomatic factor in physical condition    Upper GI bleed    Diverticulosis of colon with hemorrhage    Chronic wound infection of abdomen 12/22/2014   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   HTN (hypertension) 12/22/2014   Ataxia 11/01/2014   Hemorrhoids 04/26/2014   Lung nodule 04/26/2014   Diverticulosis    Gastritis    Hiatal hernia    Schatzki's ring    Acute blood loss anemia 04/25/2014   Hypokalemia 04/25/2014   Hematemesis with nausea 04/25/2014   Lung nodule < 6cm on CT 04/25/2014   Intractable nausea and vomiting 04/24/2014   Gastroparesis    Abdominal pain 04/01/2014   Gastroenteritis 12/10/2013   Chronic abdominal wound infection 08/16/2013   MDD (major depressive disorder), recurrent episode, severe (HCC) 06/27/2013   Wrist laceration 06/24/2013   Diarrhea 05/07/2013   Rectal bleeding 05/07/2013   Adrenal mass, left 05/07/2013   Post-operative wound abscess 04/19/2013   Abdominal wall abscess 04/18/2013   Essential hypertension, benign 03/30/2013   History of cocaine abuse (HCC) 03/16/2013   History of schizoaffective disorder 03/16/2013   Bipolar disorder (HCC) 03/16/2013   Personality disorder (HCC) 03/16/2013   Tobacco use disorder 03/16/2013   Alcohol abuse 03/16/2013   Palpitations 03/16/2013   Poor vision 03/16/2013   History of gastric bypass 03/16/2013   Status post hysterectomy  with oophorectomy 03/16/2013   Hypothyroid 03/16/2013   SLE (systemic lupus erythematosus) (HCC) 03/16/2013   Past Medical History:  Past Medical History:  Diagnosis Date   Abscess    soft tissue   Adrenal mass    Alcohol abuse    Anemia    Anxiety    Blood  transfusion without reported diagnosis    Chronic abdominal pain    Chronic wound infection of abdomen    Colon polyp    colonoscopy 04/2014   Coronary artery disease    Mild non obstr CAD   Depression    Discoid lupus erythematosus    Diverticulosis    colonoscopy 04/2014 moderat pan colonic   Drug-seeking behavior    Gastritis    EGD 05/2014   Gastroparesis 01/2014   GERD (gastroesophageal reflux disease)    Hemorrhoid    internal large   Hiatal hernia    History of Billroth II operation    Hypertension    Lung nodule    CT 02/2014 needs repeat 1 month   Lung nodule < 6cm on CT 04/25/2014   Lupus    Malingering    Migraine    Nausea and vomiting    chronic, recurrent   Pancreatitis    Schatzki's ring    patent per EGD 04/2014   Sickle cell trait    Stroke (HCC)    Suicide attempt (HCC)    Thyroid  disease 2000   overactive, radiation   Past Surgical History:  Past Surgical History:  Procedure Laterality Date   ABDOMINAL HYSTERECTOMY  2013   [place]   ABDOMINAL HYSTERECTOMY     ABDOMINAL SURGERY     ADRENALECTOMY Right    AGILE CAPSULE N/A 01/05/2015   Procedure: AGILE CAPSULE;  Surgeon: Lamar CHRISTELLA Hollingshead, MD;  [facility]: [facility];  Service: Endoscopy;  Laterality: N/A;  0700   BALLOON DILATION N/A 10/01/2021   Procedure: BALLOON DILATION;  Surgeon: Cindie Carlin POUR, DO;  Location: [facility];  Service: Endoscopy;  Laterality: N/A;   Billroth II procedure      [place], first 2000, 2005/2006.   bilroth 2     BIOPSY  05/20/2013   Procedure: BIOPSIES OF ASCENDING AND SIGMOID COLON;  Surgeon: Lamar CHRISTELLA Hollingshead, MD;  Location: [facility];  Service: Endoscopy;;   BIOPSY  04/26/2014   Procedure: BIOPSIES;  Surgeon: Margo LITTIE Haddock, MD;  Location: [facility];  Service: Endoscopy;;   BIOPSY  12/24/2018   Procedure: BIOPSY;  Surgeon: Hollingshead Lamar CHRISTELLA, MD;  [facility]: [facility];  Service: Endoscopy;;  colon   CARPAL TUNNEL RELEASE Left 01/09/2024   Procedure: RELEASE, CARPAL  TUNNEL, ENDOSCOPIC;  Surgeon: Arlinda Buster, MD;  Location: [facility];  Service: Orthopedics;  Laterality: Left;   CHOLECYSTECTOMY     COLONOSCOPY     in [place]   COLONOSCOPY N/A 12/16/2023   Procedure: COLONOSCOPY;  Surgeon: Cindie Carlin POUR, DO;  [facility]: [facility];  Service: Endoscopy;  Laterality: N/A;  145pm, ok rm 1   COLONOSCOPY WITH PROPOFOL  N/A 05/20/2013   Dr.Rourk- inadequate prep, normal appearing rectum, grossly normal colon aside from pancolonic diverticula, normal terminal ileum bx= unremarkable colonic mucosa. Due for early interval 2016.    COLONOSCOPY WITH PROPOFOL  N/A 04/26/2014   DOQ:wnmfjo ileum/one colon polyp removed/moderate pan-colonic diverticulosis/large internal hemorrhoids   COLONOSCOPY WITH PROPOFOL  N/A 12/23/2014   Dr.Rourk- minimal internal hemorrhoids, pancolonic diverticulosis   COLONOSCOPY WITH PROPOFOL  N/A 08/23/2016   Procedure: COLONOSCOPY WITH  PROPOFOL ;  Surgeon: Harvey Margo CROME, MD;  [facility];  Service: Endoscopy;  Laterality: N/A;   COLONOSCOPY WITH PROPOFOL  N/A 12/24/2018   Pancolonic diverticulosis, normal TI, one 5 mm polyp in rectum (tubular adenoma). Somewhat friable hemorrhagic mucosa in area of IC valve/cecum, possibly related to scope trauma s/p biopsy. Repeat in 7 years.   DEBRIDEMENT OF ABDOMINAL WALL ABSCESS N/A 02/08/2013   Procedure: DEBRIDEMENT OF ABDOMINAL WALL ABSCESS;  Surgeon: Oneil DELENA Budge, MD;  Location: [facility];  Service: General;  Laterality: N/A;   ESOPHAGEAL DILATION N/A 12/16/2023   Procedure: DILATION, ESOPHAGUS;  Surgeon: Cindie Carlin POUR, DO;  [facility];  Service: Endoscopy;  Laterality: N/A;   ESOPHAGOGASTRODUODENOSCOPY N/A 12/16/2023   Procedure: EGD (ESOPHAGOGASTRODUODENOSCOPY);  Surgeon: Cindie Carlin POUR, DO;  [facility];  Service: Endoscopy;  Laterality: N/A;   ESOPHAGOGASTRODUODENOSCOPY (EGD) WITH PROPOFOL  N/A 05/20/2013   Dr.Rourk- s/p prior gastric  surgery with normal esophagus, residual gastric mucosa and patent efferent limb   ESOPHAGOGASTRODUODENOSCOPY (EGD) WITH PROPOFOL  N/A 02/03/2014   Dr. Shaaron:  s/p hemigastrectomy with retained gastric contents. Residual gastric mucosa and efferent limb appeared normal otherwise. Query gastroparesis.    ESOPHAGOGASTRODUODENOSCOPY (EGD) WITH PROPOFOL  N/A 04/26/2014   DOQ:dryjusxp'd ring/small HH/mild non-erosive gasrtitis/normal anastomosis   ESOPHAGOGASTRODUODENOSCOPY (EGD) WITH PROPOFOL  N/A 12/23/2014   Dr.Rourk- s/p prior hemigastrctomy, active oozing from anastomotic suture site, hemostasis achieved   ESOPHAGOGASTRODUODENOSCOPY (EGD) WITH PROPOFOL  N/A 05/23/2016   Dr. 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    ESOPHAGOGASTRODUODENOSCOPY (EGD) WITH PROPOFOL  N/A 05/11/2017   Procedure: ESOPHAGOGASTRODUODENOSCOPY (EGD) WITH PROPOFOL ;  Surgeon: Shaaron Lamar HERO, MD;  [facility];  Service: Endoscopy;  Laterality: N/A;   ESOPHAGOGASTRODUODENOSCOPY (EGD) WITH PROPOFOL  N/A 06/07/2021   Procedure: ESOPHAGOGASTRODUODENOSCOPY (EGD) WITH PROPOFOL ;  Surgeon: Shaaron Lamar HERO, MD;  [facility];  Service: Endoscopy;  Laterality: N/A;  11:45am   ESOPHAGOGASTRODUODENOSCOPY (EGD) WITH PROPOFOL  N/A 10/01/2021   Procedure: ESOPHAGOGASTRODUODENOSCOPY (EGD) WITH PROPOFOL ;  Surgeon: Cindie Carlin POUR, DO;  [facility];  Service: Endoscopy;  Laterality: N/A;  3:00pm   EXCISION OF MESH  08/2019   FLEXIBLE SIGMOIDOSCOPY N/A 12/17/2023   Procedure: KINGSTON SIDE;  Surgeon: Shaaron Lamar HERO, MD;  [facility]: [facility];  Service: Endoscopy;  Laterality: N/A;   HEMORRHOID SURGERY N/A 06/18/2017   Procedure: THREE COLUMN EXTENSIVE HEMORRHOIDECTOMY;  Surgeon: Kallie Manuelita BROCKS, MD;  Location: [facility];  Service: General;  Laterality: N/A;   HERNIA REPAIR     INCISION AND DRAINAGE ABSCESS N/A  01/06/2017   Procedure: INCISION AND DRAINAGE ABDOMINAL WALL ABSCESS;  Surgeon: Budge Oneil, MD;  Location: [facility];  Service: General;  Laterality: N/A;   incisional hernial repair  09/05/2021   [place]   LEFT HEART CATH AND CORONARY ANGIOGRAPHY N/A 05/09/2020   Procedure: LEFT HEART CATH AND CORONARY ANGIOGRAPHY;  Surgeon: Elmira Newman PARAS, MD;  Location: [facility];  Service: Cardiovascular;  Laterality: N/A;   LEFT HEART CATH AND CORONARY ANGIOGRAPHY N/A 08/22/2023   Procedure: LEFT HEART CATH AND CORONARY ANGIOGRAPHY;  Surgeon: Verlin Lonni BIRCH, MD;  Location: [facility];  Service: Cardiovascular;  Laterality: N/A;   POLYPECTOMY  12/24/2018   Procedure: POLYPECTOMY;  Surgeon: Shaaron Lamar HERO, MD;  [facility];  Service: Endoscopy;;  colon   tendon repar Right    wrist   WOUND EXPLORATION Right 06/24/2013  Procedure: exploration of traumatic wound right wrist;  Surgeon: Franky JONELLE Curia, MD;  Location: [facility];  Service: Orthopedics;  Laterality: Right;   HPI:  58yo female admitted 01/18/24 with sudden onset severe pain behind left temple, blurred vision left eye, left side weakness and numbness. PMH: lupus, CVA, HTN, CAD, alcohol abuse, chronic abdominal pain, depression, anxiety, diverticulosis, gastritis, GERD, hiatal hernia, malingering, migraine, Schatzki's ring (2016), suicide attempt, sickle cell trait, thyroid  disease. MRI: no acute abnormality, stable since last year.   Assessment / Plan / Recommendation Clinical Impression  Pt presents with mild expressive language deficits. She exhibits fully intelligible speech. Receptively, she is able to follow complex directions and answer abstract yes/no questions accurately. Mild expressive language deficits are characterized by hesitations, dysfluencies, and word retreival difficulty. She was able to complete automatic sequences, repeat sentence length material, answer responsive naming tasks, and name 15+  animals in 60 seconds. She exhibited difficulty with providing multiple meanings of homonyms. When answering questions, pt would repeat part of SLP question quickly, then hesitate, having difficulty continuing with answering the question correctly. Pt is aware of deficits, and is frustrated by them.    Recommend outpatient speech therapy to improve functional and effective communication skills.    SLP Assessment  SLP Recommendation/Assessment: All further Speech Language Pathology needs can be addressed in the next venue of care SLP Visit Diagnosis: Aphasia (R47.01)     Assistance Recommended at Discharge  Intermittent Supervision/Assistance  Functional Status Assessment Patient has had a recent decline in their functional status and demonstrates the ability to make significant improvements in function in a reasonable and predictable amount of time.     SLP Evaluation Cognition  Overall Cognitive Status: Within Functional Limits for tasks assessed Arousal/Alertness: Awake/alert Orientation Level: Oriented X4       Comprehension  Auditory Comprehension Overall Auditory Comprehension: Appears within functional limits for tasks assessed Yes/No Questions: Within Functional Limits Commands: Within Functional Limits EffectiveTechniques: Pausing    Expression Expression Primary Mode of Expression: Verbal Verbal Expression Overall Verbal Expression: Impaired Initiation: No impairment Automatic Speech: Name;Counting;Day of week;Month of year Level of Generative/Spontaneous Verbalization: Sentence Repetition: No impairment Naming: Impairment Responsive: 76-100% accurate Confrontation: Within functional limits Convergent: 75-100% accurate Divergent: 75-100% accurate Other Naming Comments: mild dysfluency vs word retreival deficits which are frustrating to pt Verbal Errors: Aware of errors Pragmatics: No impairment Non-Verbal Means of Communication: Not applicable Written  Expression Dominant Hand: Right   Oral / Motor  Oral Motor/Sensory Function Overall Oral Motor/Sensory Function: Within functional limits Motor Speech Overall Motor Speech: Appears within functional limits for tasks assessed Intelligibility: Intelligible           Takyla Kuchera B. Dory, MSP, CCC-SLP Speech Language Pathologist Office: [phone]  Dory Caprice Daring 01/20/2024, 12:10 PM

## 2024-01-20 NOTE — Discharge Summary (Addendum)
 Stroke Discharge Summary  Patient ID: Angel Price   MRN: [id number]      DOB: [birth year]  Date of Admission: 01/18/2024 Date of Discharge: 01/20/2024  Attending Physician:  Stroke, Md, MD, Stroke MD Patient's PCP:  Carlette Benita Area, MD  DISCHARGE DIAGNOSIS: Stroke-like episode Principal Problem:   Stroke-like episode Atypical migraine  Allergies as of 01/20/2024       Reactions   Metoclopramide  Anaphylaxis, Other (See Comments)   Reglan  EPS symptoms  Jerking movements   Codeine Hives        Medication List     STOP taking these medications    amoxicillin -clavulanate 875-125 MG tablet Commonly known as: AUGMENTIN    hydroxychloroquine  200 MG tablet Commonly known as: PLAQUENIL    oxyCODONE  5 MG immediate release tablet Commonly known as: Roxicodone        TAKE these medications    amLODipine  10 MG tablet Commonly known as: NORVASC  Take 10 mg by mouth daily.   aspirin  EC 81 MG tablet Take 1 tablet (81 mg total) by mouth daily with breakfast.   atenolol  50 MG tablet Commonly known as: TENORMIN  Take 1 tablet (50 mg total) by mouth daily.   atorvastatin 40 MG tablet Commonly known as: LIPITOR Take 1 tablet (40 mg total) by mouth daily. Start taking on: January 21, 2024   B-12 1000 MCG Subl Place 1,000 mcg under the tongue daily.   butalbital-acetaminophen -caffeine 50-325-40 MG tablet Commonly known as: FIORICET Take 1 tablet by mouth every 6 (six) hours as needed for headache.   Cholecalciferol  50 MCG (2000 UT) Caps Take 1 tablet by mouth daily.   DULoxetine  30 MG capsule Commonly known as: CYMBALTA  Take 30 mg by mouth 2 (two) times daily.   gabapentin  100 MG capsule Commonly known as: NEURONTIN  Take 100 mg by mouth 3 (three) times daily.   mirtazapine  15 MG tablet Commonly known as: REMERON  Take 15 mg by mouth at bedtime as needed (for sleep).   nicotine  14 mg/24hr patch Commonly known as: NICODERM CQ  - dosed in mg/24  hours Place 1 patch (14 mg total) onto the skin daily. Start taking on: January 21, 2024   nitroGLYCERIN  0.4 MG SL tablet Commonly known as: NITROSTAT  Place 1 tablet (0.4 mg total) under the tongue every 5 (five) minutes as needed for chest pain.   pantoprazole  40 MG tablet Commonly known as: PROTONIX  Take 1 tablet (40 mg total) by mouth daily before breakfast.   promethazine  25 MG tablet Commonly known as: PHENERGAN  Take 0.5 tablets (12.5 mg total) by mouth every 6 (six) hours as needed for nausea or vomiting.   thiamine  100 MG tablet Commonly known as: VITAMIN B1 Take 100 mg by mouth daily.   tiZANidine 2 MG tablet Commonly known as: ZANAFLEX Take 2 mg by mouth at bedtime.        LABORATORY STUDIES CBC    Component Value Date/Time   WBC 6.7 01/19/2024 0439   RBC 4.32 01/19/2024 0439   HGB 10.6 (L) 01/19/2024 0439   HGB 13.5 11/26/2023 1529   HCT 30.4 (L) 01/19/2024 0439   HCT 42.8 11/26/2023 1529   PLT 433 (H) 01/19/2024 0439   PLT 321 11/26/2023 1529   MCV 70.4 (L) 01/19/2024 0439   MCV 79 11/26/2023 1529   MCH 24.5 (L) 01/19/2024 0439   MCHC 34.9 01/19/2024 0439   RDW 18.5 (H) 01/19/2024 0439   RDW 17.1 (H) 11/26/2023 1529   LYMPHSABS 4.4 (H) 01/18/2024 1710  LYMPHSABS 4.1 (H) 11/26/2023 1529   MONOABS 0.7 01/18/2024 1710   EOSABS 0.2 01/18/2024 1710   EOSABS 0.1 11/26/2023 1529   BASOSABS 0.0 01/18/2024 1710   BASOSABS 0.0 11/26/2023 1529   CMP    Component Value Date/Time   NA 140 01/19/2024 0439   NA 144 05/15/2021 1524   K 3.4 (L) 01/19/2024 0439   CL 109 01/19/2024 0439   CO2 21 (L) 01/19/2024 0439   GLUCOSE 94 01/19/2024 0439   BUN 5 (L) 01/19/2024 0439   BUN 7 05/15/2021 1524   CREATININE 0.48 01/19/2024 0439   CREATININE 0.45 (L) 03/26/2023 1124   CALCIUM  9.0 01/19/2024 0439   PROT 7.1 01/18/2024 1710   PROT 7.5 05/15/2021 1524   ALBUMIN 4.3 01/18/2024 1710   ALBUMIN 4.5 05/15/2021 1524   AST 27 01/18/2024 1710   ALT 24 01/18/2024  1710   ALKPHOS 119 01/18/2024 1710   BILITOT 0.5 01/18/2024 1710   BILITOT 0.5 05/15/2021 1524   GFRNONAA >60 01/19/2024 0439   GFRAA 119 05/05/2020 0807   COAGS Lab Results  Component Value Date   INR 1.0 01/18/2024   INR 1.1 07/13/2021   INR 1.3 (H) 11/10/2019   Lipid Panel    Component Value Date/Time   CHOL 197 01/19/2024 0439   TRIG 76 01/19/2024 0439   HDL 40 (L) 01/19/2024 0439   CHOLHDL 4.9 01/19/2024 0439   VLDL 15 01/19/2024 0439   LDLCALC 142 (H) 01/19/2024 0439   LDLCALC 93 11/14/2023 1031   HgbA1C  Lab Results  Component Value Date   HGBA1C 5.4 01/19/2024   Urinalysis    Component Value Date/Time   COLORURINE YELLOW 09/09/2023 2259   APPEARANCEUR CLEAR 09/09/2023 2259   LABSPEC 1.021 09/09/2023 2259   PHURINE 6.0 09/09/2023 2259   GLUCOSEU NEGATIVE 09/09/2023 2259   HGBUR NEGATIVE 09/09/2023 2259   BILIRUBINUR NEGATIVE 09/09/2023 2259   KETONESUR NEGATIVE 09/09/2023 2259   PROTEINUR NEGATIVE 09/09/2023 2259   UROBILINOGEN 0.2 11/26/2014 0915   NITRITE NEGATIVE 09/09/2023 2259   LEUKOCYTESUR NEGATIVE 09/09/2023 2259   Urine Drug Screen     Component Value Date/Time   LABOPIA NEGATIVE 01/18/2024 1646   COCAINSCRNUR NEGATIVE 01/18/2024 1646   LABBENZ NEGATIVE 01/18/2024 1646   AMPHETMU NEGATIVE 01/18/2024 1646   THCU NEGATIVE 01/18/2024 1646   LABBARB NEGATIVE 01/18/2024 1646    Alcohol Level    Component Value Date/Time   ETH <15 01/18/2024 1710     SIGNIFICANT DIAGNOSTIC STUDIES MR BRAIN WO CONTRAST Result Date: 01/20/2024 EXAM: MRI BRAIN WITHOUT CONTRAST 01/19/2024 05:41:42 PM TECHNIQUE: Multiplanar multisequence MRI of the head/brain was performed without the administration of intravenous contrast. COMPARISON: MR Head without Contrast 03/15/2023, head CT 01/19/2024 and 01/18/2024, CTA head and neck 01/18/2024. CLINICAL HISTORY: 58 year old female. Code stroke presentation on 01/18/2024. FINDINGS: BRAIN AND VENTRICLES: No acute infarct. No  intracranial hemorrhage. No mass. No midline shift. No hydrocephalus. Stable brain volume since last year. Chronic partially empty sella. Elnor and white matter signal stable and normal for age. No cortical and subtle malacia. No chronic cerebral blood products on SWI. Normal flow voids. ORBITS: No acute abnormality. SINUSES AND MASTOIDS: No acute abnormality. BONES AND SOFT TISSUES: Normal marrow signal. No acute soft tissue abnormality. IMPRESSION: 1. No acute intracranial abnormality. 2. Stable since last year and negative for age non-contrast MRI appearance of the brain. Electronically signed by: Helayne Hurst MD 01/20/2024 04:09 AM EST RP Workstation: [id number]   ECHOCARDIOGRAM COMPLETE Result Date: 01/19/2024  ECHOCARDIOGRAM REPORT   Patient Name:   Angel Price Date of Exam: 01/19/2024 Medical Rec #:  [id number]        Height:       63.0 in Accession #:    [id number]       Weight:       163.1 lb Date of Birth:  02-04-1966        BSA:          1.773 m Patient Age:    58 years         BP:           108/72 mmHg Patient Gender: F                HR:           73 bpm. Exam Location:  Inpatient Procedure: 2D Echo, Cardiac Doppler and Color Doppler (Both Spectral and Color            Flow Doppler were utilized during procedure). Indications:    Stroke I63.9  History:        Patient has prior history of Echocardiogram examinations, most                 recent 02/23/2023. CAD, Stroke, Arrythmias:Palpitations,                 Signs/Symptoms:Syncope; Risk Factors:Hyperlipidemia.  Sonographer:    BERNARDA ROCKS Referring Phys: ANIUS.BANISTER MCNEILL P KIRKPATRICK IMPRESSIONS  1. Left ventricular ejection fraction, by estimation, is 60 to 65%. Left ventricular ejection fraction by 2D MOD biplane is 68.3 %. The left ventricle has normal function. The left ventricle has no regional wall motion abnormalities. Left ventricular diastolic parameters were normal.  2. Right ventricular systolic function is normal. The right ventricular  size is normal.  3. The mitral valve is normal in structure. No evidence of mitral valve regurgitation. No evidence of mitral stenosis.  4. The aortic valve is normal in structure. Aortic valve regurgitation is not visualized. No aortic stenosis is present.  5. The inferior vena cava is normal in size with <50% respiratory variability, suggesting right atrial pressure of 8 mmHg. Comparison(s): Compared to prior echo, no significant changes noted. FINDINGS  Left Ventricle: Left ventricular ejection fraction, by estimation, is 60 to 65%. Left ventricular ejection fraction by 2D MOD biplane is 68.3 %. The left ventricle has normal function. The left ventricle has no regional wall motion abnormalities. The left ventricular internal cavity size was normal in size. There is no left ventricular hypertrophy. Left ventricular diastolic parameters were normal. Right Ventricle: The right ventricular size is normal. No increase in right ventricular wall thickness. Right ventricular systolic function is normal. Left Atrium: Left atrial size was normal in size. Right Atrium: Right atrial size was normal in size. Pericardium: There is no evidence of pericardial effusion. Mitral Valve: The mitral valve is normal in structure. No evidence of mitral valve regurgitation. No evidence of mitral valve stenosis. MV peak gradient, 5.3 mmHg. The mean mitral valve gradient is 2.0 mmHg. Tricuspid Valve: The tricuspid valve is normal in structure. Tricuspid valve regurgitation is not demonstrated. No evidence of tricuspid stenosis. Aortic Valve: The aortic valve is normal in structure. Aortic valve regurgitation is not visualized. No aortic stenosis is present. Aortic valve mean gradient measures 4.0 mmHg. Aortic valve peak gradient measures 8.1 mmHg. Aortic valve area, by VTI measures 3.85 cm. Pulmonic Valve: The pulmonic valve was normal in structure. Pulmonic valve regurgitation is not visualized. No  evidence of pulmonic stenosis. Aorta:  The aortic root is normal in size and structure. Venous: The inferior vena cava is normal in size with less than 50% respiratory variability, suggesting right atrial pressure of 8 mmHg. IAS/Shunts: No atrial level shunt detected by color flow Doppler.  LEFT VENTRICLE PLAX 2D                        Biplane EF (MOD) LVIDd:         4.60 cm         LV Biplane EF:   Left LVIDs:         2.70 cm                          ventricular LV PW:         0.90 cm                          ejection LV IVS:        1.00 cm                          fraction by LVOT diam:     2.40 cm                          2D MOD LV SV:         117                              biplane is LV SV Index:   66                               68.3 %. LVOT Area:     4.52 cm                                Diastology                                LV e' medial:    8.05 cm/s LV Volumes (MOD)               LV E/e' medial:  11.9 LV vol d, MOD    75.8 ml       LV e' lateral:   8.81 cm/s A2C:                           LV E/e' lateral: 10.9 LV vol d, MOD    82.8 ml A4C: LV vol s, MOD    22.6 ml A2C: LV vol s, MOD    29.0 ml A4C: LV SV MOD A2C:   53.2 ml LV SV MOD A4C:   82.8 ml LV SV MOD BP:    55.9 ml RIGHT VENTRICLE             IVC RV Basal diam:  2.90 cm     IVC diam: 1.40 cm RV S prime:     12.80 cm/s TAPSE (M-mode): 2.2 cm LEFT ATRIUM             Index  RIGHT ATRIUM           Index LA diam:        4.00 cm 2.26 cm/m   RA Area:     12.10 cm LA Vol (A2C):   51.5 ml 29.04 ml/m  RA Volume:   24.30 ml  13.70 ml/m LA Vol (A4C):   29.4 ml 16.58 ml/m LA Biplane Vol: 39.5 ml 22.28 ml/m  AORTIC VALVE                    PULMONIC VALVE AV Area (Vmax):    4.30 cm     PV Vmax:          1.12 m/s AV Area (Vmean):   3.84 cm     PV Peak grad:     5.0 mmHg AV Area (VTI):     3.85 cm     PR End Diast Vel: 2.17 msec AV Vmax:           142.00 cm/s AV Vmean:          89.200 cm/s AV VTI:            0.303 m AV Peak Grad:      8.1 mmHg AV Mean Grad:      4.0 mmHg LVOT Vmax:          135.00 cm/s LVOT Vmean:        75.800 cm/s LVOT VTI:          0.258 m LVOT/AV VTI ratio: 0.85  AORTA Ao Root diam: 3.40 cm Ao Asc diam:  3.30 cm MITRAL VALVE MV Area (PHT): 2.82 cm    SHUNTS MV Area VTI:   3.22 cm    Systemic VTI:  0.26 m MV Peak grad:  5.3 mmHg    Systemic Diam: 2.40 cm MV Mean grad:  2.0 mmHg MV Vmax:       1.15 m/s MV Vmean:      63.1 cm/s MV Decel Time: 269 msec MV E velocity: 96.10 cm/s MV A velocity: 87.90 cm/s MV E/A ratio:  1.09 Franck Azobou Tonleu Electronically signed by Joelle Cedars Tonleu Signature Date/Time: 01/19/2024/11:59:57 AM    Final    CT HEAD WO CONTRAST Result Date: 01/19/2024 EXAM: CT HEAD WITHOUT CONTRAST 01/19/2024 12:28:00 AM TECHNIQUE: CT of the head was performed without the administration of intravenous contrast. Automated exposure control, iterative reconstruction, and/or weight based adjustment of the mA/kV was utilized to reduce the radiation dose to as low as reasonably achievable. COMPARISON: 01/18/2024 CLINICAL HISTORY: Stroke, follow up. FINDINGS: BRAIN AND VENTRICLES: Empty sella noted, stable. No acute hemorrhage. No evidence of acute infarct. No hydrocephalus. No extra-axial collection. No mass effect or midline shift. ORBITS: No acute abnormality. SINUSES: No acute abnormality. SOFT TISSUES AND SKULL: No acute soft tissue abnormality. No skull fracture. IMPRESSION: 1. Stable head CT. No acute intracranial abnormality. Electronically signed by: Morene Hoard MD 01/19/2024 01:01 AM EST RP Workstation: [id number]   CT Head Wo Contrast Result Date: 01/18/2024 EXAM: CT HEAD WITHOUT CONTRAST 01/18/2024 06:56:13 PM TECHNIQUE: CT of the head was performed without the administration of intravenous contrast. Automated exposure control, iterative reconstruction, and/or weight based adjustment of the mA/kV was utilized to reduce the radiation dose to as low as reasonably achievable. COMPARISON: Earlier today. CLINICAL HISTORY: Headache, fever.  FINDINGS: BRAIN AND VENTRICLES: No acute hemorrhage. No evidence of acute infarct. No hydrocephalus. No extra-axial collection. No mass effect or midline shift. ORBITS: No acute abnormality. SINUSES: No  acute abnormality. SOFT TISSUES AND SKULL: No acute soft tissue abnormality. No skull fracture. IMPRESSION: 1. No acute intracranial abnormality. Electronically signed by: Franky Crease MD 01/18/2024 07:06 PM EST RP Workstation: [id number]   CT ANGIO HEAD NECK W WO CM (CODE STROKE) Result Date: 01/18/2024 EXAM: CTA HEAD AND NECK WITHOUT AND WITH 01/18/2024 05:39:17 PM TECHNIQUE: CTA of the head and neck was performed without and with the administration of 75 mL of iohexol  (OMNIPAQUE ) 350 MG/ML intravenous contrast. Multiplanar 2D and/or 3D reformatted images are provided for review. Automated exposure control, iterative reconstruction, and/or weight based adjustment of the mA/kV was utilized to reduce the radiation dose to as low as reasonably achievable. Stenosis of the internal carotid arteries measured using NASCET criteria. COMPARISON: CTA head and neck 03/14/2023 CLINICAL HISTORY: Neuro deficit, acute, stroke suspected. Left sided weakness, blurry vision, and headache. FINDINGS: CTA NECK: AORTIC ARCH AND ARCH VESSELS: Normal variant aortic arch branching pattern with common origin of the brachiocephalic and left common carotid arteries. No dissection or arterial injury. No significant stenosis of the brachiocephalic or subclavian arteries. CERVICAL CAROTID ARTERIES: Minimal calcified plaque at the left carotid bifurcation. No dissection, arterial injury, or hemodynamically significant stenosis by NASCET criteria. CERVICAL VERTEBRAL ARTERIES: Codominant vertebral arteries. No dissection, arterial injury, or significant stenosis. LUNGS AND MEDIASTINUM: Motion artifact and mild atelectasis in the included upper lungs. SOFT TISSUES: No acute abnormality. BONES: Disc degeneration at C5-C6 and C6-C7. CTA HEAD:  ANTERIOR CIRCULATION: The internal carotid arteries are patent from skull base to carotid termini with mild atherosclerosis not resulting in a significant stenosis. ACAs and MCAs are patent with branch vessel evaluation being mildly limited by venous contamination, however no discrete proximal branch occlusion or significant proximal stenosis is identified. No aneurysm. POSTERIOR CIRCULATION: The intracranial vertebral arteries are widely patent to the basilar. Patent PICA and SCA origins are visualized bilaterally. The basilar artery is widely patent. There are right larger than left posterior communicating arteries. Both PCAs are patent without evidence of a significant proximal stenosis. No aneurysm. OTHER: No dural venous sinus thrombosis on this non-dedicated study. IMPRESSION: 1. Mild atherosclerosis without a large vessel occlusion or significant proximal stenosis in the head or neck. 2. These results were communicated to Dr. FREDRIK Hock at 5:51 PM on 01/18/2024 by secure text page via the [facility] messaging system. Electronically signed by: Dasie Hamburg MD 01/18/2024 05:59 PM EST RP Workstation: [id number]   CT HEAD CODE STROKE WO CONTRAST (LKW 0-4.5h, LVO 0-24h) Result Date: 01/18/2024 EXAM: CT HEAD WITHOUT 01/18/2024 05:05:43 PM TECHNIQUE: CT of the head was performed without the administration of intravenous contrast. Automated exposure control, iterative reconstruction, and/or weight based adjustment of the mA/kV was utilized to reduce the radiation dose to as low as reasonably achievable. COMPARISON: Head MRI 03/15/2023 and CTA 03/14/2023. CLINICAL HISTORY: Neuro deficit, acute, stroke suspected. Left sided weakness, blurred vision, and headache. History of stroke. FINDINGS: BRAIN AND VENTRICLES: There is no evidence of an acute infarct, intracranial hemorrhage, mass, midline shift, hydrocephalus, or extra-axial fluid collection. Cerebral volume is normal. A mildly enlarged partially empty sella is  unchanged. ORBITS: No acute abnormality. SINUSES AND MASTOIDS: No acute abnormality. SOFT TISSUES AND SKULL: No acute skull fracture. No acute soft tissue abnormality. [place] Stroke Program Early CT Score (ASPECTS) ----- Ganglionic (caudate, IC, lentiform nucleus, insula, M1-M3): 7 Supraganglionic (M4-M6): 3 Total: 10 IMPRESSION: 1. No acute intracranial abnormality. ASPECTS of 10. 2. These results were communicated to Dr. FREDRIK Hock at 5:16 PM on 01/18/2024 by  secure text page via the [facility] messaging system. Electronically signed by: Dasie Hamburg MD 01/18/2024 05:20 PM EST RP Workstation: [id number]      HISTORY OF PRESENT ILLNESS  58 y.o. female with history of CVA, HTN, CAD, DLE, migraines, anxiety/depression who presented 11/2 with left-sided weakness and headache, bilateral visual changes.  Administered TNK.  Patient endorses history of stroke, no previous imaging seen on chart review.  She was admitted for post-TNK monitoring and further secondary stroke prevention workup.  NIH on admission: 5.    HOSPITAL COURSE Strokelike episode - treated with IV TNK CT Head without contrast(Personally reviewed): Negative CT angio Head and Neck with contrast(Personally reviewed): No LVO MRI:  No acute intracranial abnormality 2D Echo: EF 60-65% LDL 142 HgbA1c 5.4 VTE prophylaxis - SCDs, will start lovenox  if post-TNK imaging is negative for hemorrhage.  aspirin  81 mg daily prior to admission, continue on Aspirin .  Therapy recommendations:  Outpatient SLP Disposition:  Home 11/4   Hx of Stroke/TIA Patient states she has history of stroke and TIAs, unable to locate imaging or other chart notes.  Normal MRI 2013 ([facility])   Hypertension Home meds:  Amlodipine  10 mg daily, atenolol  50 mg daily Stable Blood Pressure Goal: BP less than 180/105    Hyperlipidemia Home meds: none LDL 142, goal < 70 Add Lipitor 40mg   Continue statin at discharge  Diabetes  Home meds: None HgbA1c 5.4, goal <  7.0 CBGs SSI Recommend close follow-up with PCP for better DM control   Tobacco Abuse Patient smokes 0.5 packs per day        Ready to quit? Yes Nicotine  replacement therapy provided   Other Stroke Risk Factors ETOH use, alcohol level <15, advised to drink no more than 1-2 drink(s) a day Obesity, Body mass index is 28.9 kg/m., BMI >/= 30 associated with increased stroke risk, recommend weight loss, diet and exercise as appropriate  Coronary artery disease Migraines Fioricet PRN   Other Active Problems History of lupus Continue Plaquenil  PTA meds: Oxycodone  5 mg every 6 as needed, tizanidine 2 mg nightly, gabapentin  100 mg 3 times daily, Cymbalta  30 mg twice daily    DISCHARGE EXAM Blood pressure (!) 130/102, pulse 72, temperature (!) 97.5 F (36.4 C), temperature source Oral, resp. rate (!) 27, weight 74 kg, SpO2 95%.  PHYSICAL EXAM General:  Alert, well-nourished, well-developed patient in no acute distress CV: Regular rate and rhythm on monitor Respiratory:  Regular, unlabored respirations on room air   NEURO:  Mental Status: AA&Ox3, patient is able to give clear and coherent history Speech/Language: No aphaisa. T   Cranial Nerves:  II: PERRL. Visual fields full.  III, IV, VI: EOMI. Eyelids elevate symmetrically.  V: Sensation is intact to light touch and symmetrical to face.  VII: Face is symmetrical resting and smiling VIII: hearing intact to voice. IX, X: Palate elevates symmetrically. Phonation is normal.  KP:Dynloizm shrug 5/5. XII: tongue is midline without fasciculations. Motor: 5/5 strength to all muscle groups tested.  Tone: is normal and bulk is normal Sensation- No numbness noted on today's exam Coordination: FTN intact bilaterally, HKS: no ataxia in BLE.No drift.  Gait- deferred   Discharge NIH: 0  Discharge Diet       Diet   Diet regular Room service appropriate? Yes; Fluid consistency: Thin   liquids  DISCHARGE PLAN Disposition:  home  11/4 aspirin  81 mg daily for stroke prevention Ongoing stroke risk factor control by Primary Care Physician at time of discharge Follow-up PCP  Fanta, Tesfaye Demissie, MD in 2 weeks. Follow-up in [facility] in 8 weeks, office to schedule an appointment.   35 minutes were spent preparing discharge.  Rocky JAYSON Likes, DNP [facility] Please use AMION for contact information & EPIC for messaging.  I have personally obtained history,examined this patient, reviewed notes, independently viewed imaging studies, participated in medical decision making and plan of care.ROS completed by me personally and pertinent positives fully documented  I have made any additions or clarifications directly to the above note. Agree with note above.    Eather Popp, MD Medical Director [facility] Pager: [phone] 01/20/2024 12:31 PM

## 2024-01-20 NOTE — Evaluation (Signed)
 Occupational Therapy Evaluation Patient Details Name: Angel Price MRN: [id number] DOB: Jun 13, 1965 Today's Date: 01/20/2024   History of Present Illness   58 y.o. female presented to [facility] ED 01/18/24 with left-sided weakness that started abruptly with severe pain behind her left temple blurred vision in her left eye. TNK given at 1730 CT head negative. Awaiting MRI 24 hrs post TPA PMH: lupus, stroke, hypertension     Clinical Impressions PTA, pt lives alone, typically Modified Independent with ADLs and mobility with occasional use of rollator. Pt has daily aide assist for 2 hours for IADLs. Pt presents now with minor deficits in L eye vision (blurriness), L sided strength and endurance. Pt reports overall improvements since initial presentation to ED. Pt able to manage functional transfers using RW and ADLs with no more than CGA. Encouraged pt to have aide present/assist with initial showering tasks and encouraged consideration of shower chair to decrease fall risk as pt reports legs with tendency to give out with fatigue. Recommend OP OT given pt has consistent transportation assist through her insurance.     If plan is discharge home, recommend the following:   A little help with bathing/dressing/bathroom;Assistance with cooking/housework;Assist for transportation;Help with stairs or ramp for entrance     Functional Status Assessment   Patient has had a recent decline in their functional status and demonstrates the ability to make significant improvements in function in a reasonable and predictable amount of time.     Equipment Recommendations   Other (comment) (shower chair vs tub bench - pt to address with follow up OT)     Recommendations for Other Services         Precautions/Restrictions   Precautions Precautions: Fall Restrictions Weight Bearing Restrictions Per Provider Order: Yes Other Position/Activity Restrictions: L carpal tunnel surgery- limit WB  through wrist     Mobility Bed Mobility Overal bed mobility: Modified Independent Bed Mobility: Supine to Sit     Supine to sit: Modified independent (Device/Increase time)          Transfers Overall transfer level: Needs assistance Equipment used: Rolling walker (2 wheels) Transfers: Sit to/from Stand, Bed to chair/wheelchair/BSC Sit to Stand: Supervision     Step pivot transfers: Contact guard assist     General transfer comment: able to easily stand from bedside, step to recliner using RW w/ gradual improvements in LLE strength noted      Balance Overall balance assessment: Needs assistance Sitting-balance support: Feet supported, No upper extremity supported Sitting balance-Leahy Scale: Good     Standing balance support: During functional activity, No upper extremity supported Standing balance-Leahy Scale: Fair                             ADL either performed or assessed with clinical judgement   ADL Overall ADL's : Needs assistance/impaired Eating/Feeding: Independent;Sitting   Grooming: Contact guard assist;Standing   Upper Body Bathing: Set up;Sitting   Lower Body Bathing: Contact guard assist;Sitting/lateral leans;Sit to/from stand   Upper Body Dressing : Set up;Sitting   Lower Body Dressing: Contact guard assist;Sitting/lateral leans;Sit to/from stand   Toilet Transfer: Contact guard assist;Ambulation;Rolling walker (2 wheels)   Toileting- Clothing Manipulation and Hygiene: Contact guard assist;Sit to/from stand;Sitting/lateral lean       Functional mobility during ADLs: Contact guard assist;Rolling walker (2 wheels) General ADL Comments: reports some initial issues with LLE when ambulating with PT yesterday but some improvements today.  Vision Baseline Vision/History: 1 Wears glasses Ability to See in Adequate Light: 1 Impaired Patient Visual Report: Blurring of vision Vision Assessment?: Vision impaired- to be further tested  in functional context Additional Comments: reports L eye blurry but improving. reports initially no vision in L eye on initial presentation to hospital     Perception         Praxis         Pertinent Vitals/Pain Pain Assessment Pain Assessment: No/denies pain     Extremity/Trunk Assessment Upper Extremity Assessment Upper Extremity Assessment: LUE deficits/detail;Left hand dominant LUE Deficits / Details: recent L carpal tunnel sx, stitches in place   Lower Extremity Assessment Lower Extremity Assessment: Defer to PT evaluation   Cervical / Trunk Assessment Cervical / Trunk Assessment: Kyphotic   Communication Communication Communication: Impaired Factors Affecting Communication: Difficulty expressing self   Cognition Arousal: Alert Behavior During Therapy: WFL for tasks assessed/performed Cognition: No family/caregiver present to determine baseline             OT - Cognition Comments: likely near baseline, some difficulty expressing self and answering questions                 Following commands: Impaired Following commands impaired: Follows multi-step commands inconsistently, Follows multi-step commands with increased time     Cueing  General Comments   Cueing Techniques: Verbal cues      Exercises     Shoulder Instructions      Home Living Family/patient expects to be discharged to:: Private residence Living Arrangements: Alone Available Help at Discharge: Personal care attendant (aide for ~2 hours 7 days a week) Type of Home: Apartment Home Access: Stairs to enter Entrance Stairs-Number of Steps: 2 Entrance Stairs-Rails: None Home Layout: Two level;1/2 bath on main level Alternate Level Stairs-Number of Steps: 12 Alternate Level Stairs-Rails: Right Bathroom Shower/Tub: Chief Strategy Officer: Standard Bathroom Accessibility: Yes   Home Equipment: Rollator (4 wheels)      Lives With: Alone    Prior  Functioning/Environment Prior Level of Function : Needs assist             Mobility Comments: furniture walks in apartment, medicaid transportation to doctors, ambulating limited community distances ADLs Comments: HHAide 2 hrs/7days/wk - reports aide helps more with IADLs than ADLs prior to hospitalization    OT Problem List: Decreased strength;Decreased activity tolerance;Impaired balance (sitting and/or standing);Impaired vision/perception;Decreased knowledge of use of DME or AE   OT Treatment/Interventions: Self-care/ADL training;Therapeutic exercise;Energy conservation;DME and/or AE instruction;Therapeutic activities;Patient/family education;Balance training      OT Goals(Current goals can be found in the care plan section)   Acute Rehab OT Goals Patient Stated Goal: home today OT Goal Formulation: With patient Time For Goal Achievement: 02/03/24 Potential to Achieve Goals: Good   OT Frequency:  Min 2X/week    Co-evaluation              AM-PAC OT 6 Clicks Daily Activity     Outcome Measure Help from another person eating meals?: None Help from another person taking care of personal grooming?: A Little Help from another person toileting, which includes using toliet, bedpan, or urinal?: A Little Help from another person bathing (including washing, rinsing, drying)?: A Little Help from another person to put on and taking off regular upper body clothing?: A Little Help from another person to put on and taking off regular lower body clothing?: A Little 6 Click Score: 19   End of Session Equipment Utilized During  Treatment: Rolling walker (2 wheels) Nurse Communication: Mobility status  Activity Tolerance: Patient tolerated treatment well;Patient limited by pain Patient left: in chair;with call bell/phone within reach  OT Visit Diagnosis: Unsteadiness on feet (R26.81);Muscle weakness (generalized) (M62.81)                Time: 8776-8764 OT Time Calculation (min):  12 min Charges:  OT General Charges $OT Visit: 1 Visit OT Evaluation $OT Eval Low Complexity: 1 Low  Mliss NOVAK, OTR/L Acute Rehab Services Office: [phone]   Mliss Fish 01/20/2024, 12:58 PM

## 2024-01-22 ENCOUNTER — Ambulatory Visit: Admitting: Orthopedic Surgery

## 2024-01-22 DIAGNOSIS — Z9889 Other specified postprocedural states: Secondary | ICD-10-CM

## 2024-01-22 NOTE — Progress Notes (Signed)
   Angel Price - 58 y.o. female MRN [id number]  Date of birth: [birth year]  Office Visit Note: Visit Date: 01/22/2024 PCP: Carlette Benita Area, MD Referred by: Carlette Benita Demissie*  Subjective:  HPI: Angel Price is a 58 y.o. female who presents today for follow up 2 weeks status post left wrist endoscopic carpal tunnel release.  She is doing very well overall, is very pleased with her progress, numbness and tingling has improved drastically in the left hand.  Pertinent ROS were reviewed with the patient and found to be negative unless otherwise specified above in HPI.   Assessment & Plan: Visit Diagnoses: No diagnosis found.  Plan: She is doing very well overall.  Sutures removed today.  She has begun occupational therapy already which is excellent.  I will have her follow-up in approximately 4 weeks for a recheck.  At that juncture, we can likely discuss the contralateral side.  Follow-up: No follow-ups on file.   Meds & Orders: No orders of the defined types were placed in this encounter.  No orders of the defined types were placed in this encounter.    Procedures: No procedures performed       Objective:   Vital Signs: LMP  (LMP Unknown)   Ortho Exam Left wrist with forearm incision well-healed, skin edges well-approximated without erythema or drainage, sensation intact to light touch in the median nerve distribution, 5/5 APB, thumb opposition is well-preserved  Imaging: No results found.   Casin Federici Afton Alderton, M.D. [facility], Hand Surgery

## 2024-02-03 ENCOUNTER — Ambulatory Visit: Attending: Internal Medicine | Admitting: Internal Medicine

## 2024-02-03 ENCOUNTER — Encounter: Payer: Self-pay | Admitting: Internal Medicine

## 2024-02-03 VITALS — BP 90/65 | HR 74 | Temp 96.4°F | Resp 16 | Ht 63.0 in | Wt 167.6 lb

## 2024-02-03 DIAGNOSIS — M25512 Pain in left shoulder: Secondary | ICD-10-CM

## 2024-02-03 DIAGNOSIS — G8929 Other chronic pain: Secondary | ICD-10-CM | POA: Diagnosis not present

## 2024-02-03 DIAGNOSIS — Z79899 Other long term (current) drug therapy: Secondary | ICD-10-CM

## 2024-02-03 DIAGNOSIS — M059 Rheumatoid arthritis with rheumatoid factor, unspecified: Secondary | ICD-10-CM

## 2024-02-03 MED ORDER — HYDROXYCHLOROQUINE SULFATE 200 MG PO TABS: 300.0000 mg | ORAL_TABLET | Freq: Every day | ORAL | 1 refills | Status: AC

## 2024-02-04 ENCOUNTER — Ambulatory Visit: Admitting: Occupational Therapy

## 2024-02-04 ENCOUNTER — Ambulatory Visit: Attending: Neurology

## 2024-02-04 VITALS — BP 130/89 | HR 72

## 2024-02-04 DIAGNOSIS — R2681 Unsteadiness on feet: Secondary | ICD-10-CM | POA: Insufficient documentation

## 2024-02-04 DIAGNOSIS — R531 Weakness: Secondary | ICD-10-CM | POA: Insufficient documentation

## 2024-02-04 DIAGNOSIS — R29818 Other symptoms and signs involving the nervous system: Secondary | ICD-10-CM | POA: Diagnosis present

## 2024-02-04 DIAGNOSIS — M6281 Muscle weakness (generalized): Secondary | ICD-10-CM | POA: Insufficient documentation

## 2024-02-04 DIAGNOSIS — I639 Cerebral infarction, unspecified: Secondary | ICD-10-CM | POA: Diagnosis not present

## 2024-02-04 DIAGNOSIS — R208 Other disturbances of skin sensation: Secondary | ICD-10-CM | POA: Diagnosis present

## 2024-02-04 DIAGNOSIS — R29898 Other symptoms and signs involving the musculoskeletal system: Secondary | ICD-10-CM | POA: Diagnosis present

## 2024-02-04 DIAGNOSIS — Z9889 Other specified postprocedural states: Secondary | ICD-10-CM | POA: Insufficient documentation

## 2024-02-04 DIAGNOSIS — R278 Other lack of coordination: Secondary | ICD-10-CM | POA: Insufficient documentation

## 2024-02-04 DIAGNOSIS — M545 Low back pain, unspecified: Secondary | ICD-10-CM | POA: Diagnosis present

## 2024-02-04 NOTE — Therapy (Signed)
 OUTPATIENT PHYSICAL THERAPY NEURO EVALUATION   Patient Name: Angel Price MRN: [id number] DOB:11-13-1965, 58 y.o., female Today's Date: 02/04/2024   PCP: Benita Trixie Outhouse, MD REFERRING PROVIDER: Eather Popp, MD  END OF SESSION:  PT End of Session - 02/04/24 0936     Visit Number 1    Number of Visits 9    Date for Recertification  04/02/24    Authorization Type [employer] dual    PT Start Time [id number]    PT Stop Time 1000   patient requesting to discontinue d/t fatigue   PT Time Calculation (min) 26 min    Activity Tolerance Patient limited by fatigue;Patient limited by lethargy    Behavior During Therapy Flat affect          Past Medical History:  Diagnosis Date   Abscess    soft tissue   Adrenal mass    Alcohol abuse    Anemia    Anxiety    Arrhythmia    Blood transfusion without reported diagnosis    Chronic abdominal pain    Chronic wound infection of abdomen    Colon polyp    colonoscopy 04/2014   Coronary artery disease    Mild non obstr CAD   Depression    Discoid lupus erythematosus    Diverticulosis    colonoscopy 04/2014 moderat pan colonic   Drug-seeking behavior    Gastritis    EGD 05/2014   Gastroparesis 01/2014   GERD (gastroesophageal reflux disease)    Hemorrhoid    internal large   Hiatal hernia    History of Billroth II operation    Hypertension    Lung nodule    CT 02/2014 needs repeat 1 month   Lung nodule < 6cm on CT 04/25/2014   Lupus    Malingering    Migraine    Nausea and vomiting    chronic, recurrent   Pancreatitis    Schatzki's ring    patent per EGD 04/2014   Sickle cell trait    Stroke [facility])    Suicide attempt (HCC)    Thyroid  disease 2000   overactive, radiation   Past Surgical History:  Procedure Laterality Date   ABDOMINAL HYSTERECTOMY  2013   [place]   ABDOMINAL HYSTERECTOMY     ABDOMINAL SURGERY     ADRENALECTOMY Right    AGILE CAPSULE N/A 01/05/2015   Procedure: AGILE CAPSULE;  Surgeon: Lamar CHRISTELLA Hollingshead, MD;  [facility];  Service: Endoscopy;  Laterality: N/A;  0700   BALLOON DILATION N/A 10/01/2021   Procedure: BALLOON DILATION;  Surgeon: Cindie Carlin POUR, DO;  [facility];  Service: Endoscopy;  Laterality: N/A;   Billroth II procedure      [place], first 2000, 2005/2006.   bilroth 2     BIOPSY  05/20/2013   Procedure: BIOPSIES OF ASCENDING AND SIGMOID COLON;  Surgeon: Lamar CHRISTELLA Hollingshead, MD;  Location: [facility];  Service: Endoscopy;;   BIOPSY  04/26/2014   Procedure: BIOPSIES;  Surgeon: Margo LITTIE Haddock, MD;  Location: [facility];  Service: Endoscopy;;   BIOPSY  12/24/2018   Procedure: BIOPSY;  Surgeon: Hollingshead Lamar CHRISTELLA, MD;  [facility];  Service: Endoscopy;;  colon   CARPAL TUNNEL RELEASE Left 01/09/2024   Procedure: RELEASE, CARPAL TUNNEL, ENDOSCOPIC;  Surgeon: Arlinda Buster, MD;  Location: [facility];  Service: Orthopedics;  Laterality: Left;   CHOLECYSTECTOMY     COLONOSCOPY     in [place]  COLONOSCOPY N/A 12/16/2023   Procedure: COLONOSCOPY;  Surgeon: Cindie Carlin POUR, DO;  [facility];  Service: Endoscopy;  Laterality: N/A;  145pm, ok rm 1   COLONOSCOPY WITH PROPOFOL  N/A 05/20/2013   Dr.Rourk- inadequate prep, normal appearing rectum, grossly normal colon aside from pancolonic diverticula, normal terminal ileum bx= unremarkable colonic mucosa. Due for early interval 2016.    COLONOSCOPY WITH PROPOFOL  N/A 04/26/2014   DOQ:wnmfjo ileum/one colon polyp removed/moderate pan-colonic diverticulosis/large internal hemorrhoids   COLONOSCOPY WITH PROPOFOL  N/A 12/23/2014   Dr.Rourk- minimal internal hemorrhoids, pancolonic diverticulosis   COLONOSCOPY WITH PROPOFOL  N/A 08/23/2016   Procedure: COLONOSCOPY WITH PROPOFOL ;  Surgeon: Harvey Margo CROME, MD;  [facility];  Service: Endoscopy;  Laterality: N/A;   COLONOSCOPY WITH PROPOFOL  N/A 12/24/2018   Pancolonic diverticulosis, normal TI, one 5 mm polyp in rectum  (tubular adenoma). Somewhat friable hemorrhagic mucosa in area of IC valve/cecum, possibly related to scope trauma s/p biopsy. Repeat in 7 years.   DEBRIDEMENT OF ABDOMINAL WALL ABSCESS N/A 02/08/2013   Procedure: DEBRIDEMENT OF ABDOMINAL WALL ABSCESS;  Surgeon: Oneil DELENA Budge, MD;  Location: [facility];  Service: General;  Laterality: N/A;   ESOPHAGEAL DILATION N/A 12/16/2023   Procedure: DILATION, ESOPHAGUS;  Surgeon: Cindie Carlin POUR, DO;  [facility];  Service: Endoscopy;  Laterality: N/A;   ESOPHAGOGASTRODUODENOSCOPY N/A 12/16/2023   Procedure: EGD (ESOPHAGOGASTRODUODENOSCOPY);  Surgeon: Cindie Carlin POUR, DO;  [facility];  Service: Endoscopy;  Laterality: N/A;   ESOPHAGOGASTRODUODENOSCOPY (EGD) WITH PROPOFOL  N/A 05/20/2013   Dr.Rourk- s/p prior gastric surgery with normal esophagus, residual gastric mucosa and patent efferent limb   ESOPHAGOGASTRODUODENOSCOPY (EGD) WITH PROPOFOL  N/A 02/03/2014   Dr. Shaaron:  s/p hemigastrectomy with retained gastric contents. Residual gastric mucosa and efferent limb appeared normal otherwise. Query gastroparesis.    ESOPHAGOGASTRODUODENOSCOPY (EGD) WITH PROPOFOL  N/A 04/26/2014   DOQ:dryjusxp'd ring/small HH/mild non-erosive gasrtitis/normal anastomosis   ESOPHAGOGASTRODUODENOSCOPY (EGD) WITH PROPOFOL  N/A 12/23/2014   Dr.Rourk- s/p prior hemigastrctomy, active oozing from anastomotic suture site, hemostasis achieved   ESOPHAGOGASTRODUODENOSCOPY (EGD) WITH PROPOFOL  N/A 05/23/2016   Dr. 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    ESOPHAGOGASTRODUODENOSCOPY (EGD) WITH PROPOFOL  N/A 05/11/2017   Procedure: ESOPHAGOGASTRODUODENOSCOPY (EGD) WITH PROPOFOL ;  Surgeon: Shaaron Lamar HERO, MD;  [facility];  Service: Endoscopy;  Laterality: N/A;   ESOPHAGOGASTRODUODENOSCOPY (EGD) WITH PROPOFOL  N/A 06/07/2021   Procedure: ESOPHAGOGASTRODUODENOSCOPY  (EGD) WITH PROPOFOL ;  Surgeon: Shaaron Lamar HERO, MD;  [facility];  Service: Endoscopy;  Laterality: N/A;  11:45am   ESOPHAGOGASTRODUODENOSCOPY (EGD) WITH PROPOFOL  N/A 10/01/2021   Procedure: ESOPHAGOGASTRODUODENOSCOPY (EGD) WITH PROPOFOL ;  Surgeon: Cindie Carlin POUR, DO;  [facility];  Service: Endoscopy;  Laterality: N/A;  3:00pm   EXCISION OF MESH  08/2019   FLEXIBLE SIGMOIDOSCOPY N/A 12/17/2023   Procedure: KINGSTON SIDE;  Surgeon: Shaaron Lamar HERO, MD;  [facility];  Service: Endoscopy;  Laterality: N/A;   HEMORRHOID SURGERY N/A 06/18/2017   Procedure: THREE COLUMN EXTENSIVE HEMORRHOIDECTOMY;  Surgeon: Kallie Manuelita BROCKS, MD;  Location: [facility];  Service: General;  Laterality: N/A;   HERNIA REPAIR     INCISION AND DRAINAGE ABSCESS N/A 01/06/2017   Procedure: INCISION AND DRAINAGE ABDOMINAL WALL ABSCESS;  Surgeon: Budge Oneil, MD;  Location: [facility];  Service: General;  Laterality: N/A;   incisional hernial repair  09/05/2021   [facility]   LEFT HEART CATH  AND CORONARY ANGIOGRAPHY N/A 05/09/2020   Procedure: LEFT HEART CATH AND CORONARY ANGIOGRAPHY;  Surgeon: Elmira Newman PARAS, MD;  [facility]: [facility];  Service: Cardiovascular;  Laterality: N/A;   LEFT HEART CATH AND CORONARY ANGIOGRAPHY N/A 08/22/2023   Procedure: LEFT HEART CATH AND CORONARY ANGIOGRAPHY;  Surgeon: Verlin Lonni BIRCH, MD;  Location: [facility];  Service: Cardiovascular;  Laterality: N/A;   POLYPECTOMY  12/24/2018   Procedure: POLYPECTOMY;  Surgeon: Shaaron Lamar HERO, MD;  [facility];  Service: Endoscopy;;  colon   tendon repar Right    wrist   WOUND EXPLORATION Right 06/24/2013   Procedure: exploration of traumatic wound right wrist;  Surgeon: Franky JONELLE Curia, MD;  Location: [facility];  Service: Orthopedics;  Laterality: Right;   Patient Active Problem List   Diagnosis Date Noted   Stroke-like episode 01/20/2024   Carpal tunnel syndrome of left  wrist 01/09/2024   Acute lower GI bleeding 12/16/2023   B12 deficiency 11/26/2023   Iron deficiency 11/26/2023   Chest pain 08/21/2023   Dyslipidemia 08/21/2023   Rectal pressure 08/05/2023   Abdominal bloating 08/05/2023   Anemia 08/05/2023   High risk medication use 05/05/2023   Unilateral primary osteoarthritis, left knee 05/05/2023   Seropositive rheumatoid arthritis (HCC) 05/02/2022   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   Cellulitis 10/06/2021   Paroxysmal atrial fibrillation (HCC) 10/06/2021   Abdominal fluid collection 10/06/2021   Constipation 09/14/2021   Ventral hernia 05/15/2021   Dysphagia 05/15/2021   Cigarette nicotine  dependence without complication 09/12/2020   Continuous dependence on cigarette smoking 08/22/2020   Mixed hyperlipidemia 04/27/2020   Recurrent incisional hernia 02/03/2020   Bilateral knee pain 12/29/2019   Coronary artery disease of native artery of native heart with stable angina pectoris 06/25/2019   Acute respiratory failure with hypoxia (HCC) 05/20/2019   Essential hypertension 05/20/2019   COVID-19 05/20/2019   Syncope and collapse 05/20/2019   Metabolic acidosis 05/16/2019   Intractable vomiting with nausea 02/23/2019   Cocaine abuse (HCC)    Colitis 11/11/2018   Hemorrhoidal skin tag    Folate deficiency 05/11/2017   Abdominal wall fistula    Cellulitis of abdominal wall 01/02/2017   Acute left hemiparesis (HCC) 12/05/2016   Malingering 12/05/2016   Weakness of left lower extremity 12/02/2016   CVA (cerebral vascular accident) (HCC) 11/23/2016   Bright red rectal bleeding 08/22/2016   Hypotension due to blood loss    Acute GI bleeding 05/24/2016   Dieulafoy lesion of duodenum    History of Billroth II operation    Gastrointestinal hemorrhage 05/22/2016   Absolute anemia    Acute pancreatitis 10/01/2015   Alcohol abuse with intoxication    Left-sided weakness    Psychosomatic factor in physical condition    Upper GI  bleed    Diverticulosis of colon with hemorrhage    Chronic wound infection of abdomen 12/22/2014   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   HTN (hypertension) 12/22/2014   Ataxia 11/01/2014   Hemorrhoids 04/26/2014   Lung nodule 04/26/2014   Diverticulosis    Gastritis    Hiatal hernia    Schatzki's ring    Acute blood loss anemia 04/25/2014   Hypokalemia 04/25/2014   Hematemesis with nausea 04/25/2014   Lung nodule < 6cm on CT 04/25/2014   Intractable nausea and vomiting 04/24/2014   Gastroparesis    Abdominal pain 04/01/2014   Gastroenteritis 12/10/2013   Chronic abdominal wound infection 08/16/2013   MDD (major depressive disorder), recurrent episode,  severe (HCC) 06/27/2013   Wrist laceration 06/24/2013   Diarrhea 05/07/2013   Rectal bleeding 05/07/2013   Adrenal mass, left 05/07/2013   Post-operative wound abscess 04/19/2013   Abdominal wall abscess 04/18/2013   Essential hypertension, benign 03/30/2013   History of cocaine abuse (HCC) 03/16/2013   History of schizoaffective disorder 03/16/2013   Bipolar disorder (HCC) 03/16/2013   Personality disorder (HCC) 03/16/2013   Tobacco use disorder 03/16/2013   Alcohol abuse 03/16/2013   Palpitations 03/16/2013   Poor vision 03/16/2013   History of gastric bypass 03/16/2013   Status post hysterectomy with oophorectomy 03/16/2013   Hypothyroid 03/16/2013   SLE (systemic lupus erythematosus) (HCC) 03/16/2013    ONSET DATE: 01/18/24  REFERRING DIAG:  I63.9 (ICD-10-CM) - Stroke, acute, thrombotic (HCC)  R53.1 (ICD-10-CM) - Weakness    THERAPY DIAG:  Other lack of coordination - Plan: PT plan of care cert/re-cert  Muscle weakness (generalized) - Plan: PT plan of care cert/re-cert  Unsteadiness on feet - Plan: PT plan of care cert/re-cert  Rationale for Evaluation and Treatment: Rehabilitation  SUBJECTIVE:                                                                                                                                                                                              SUBJECTIVE STATEMENT: Patient arrives to clinic alone, used medicaid transportation. Immediately stating I do not feel good today. Reports feeling weak over the past few days- denies any sick contacts. VS WNL. Experienced a stroke 11/2 and notes residual L hemiparesis and difficulty with speech. She is LHD.  Pt accompanied by: self  PERTINENT HISTORY: alcohol abuse, anemia, anxiety, CAD, depression, lupus, diverticulosis, GERD, HTN, migraine, SS trait, CVA, suicide attempt  PAIN:  Are you having pain? No; intermittent dual HA  Vitals:   02/04/24 0937  BP: 130/89  Pulse: 72    PRECAUTIONS: Fall   WEIGHT BEARING RESTRICTIONS: No  FALLS: Has patient fallen in last 6 months? No  LIVING ENVIRONMENT: Lives with: lives alone care aid 7 days/ week for 2 hours Lives in: House/apartment Stairs: No Has following equipment at home: None  PLOF: Needs assistance with homemaking  PATIENT GOALS: to get my strength back and get back socializing and cooking  OBJECTIVE:  Note: Objective measures were completed at Evaluation unless otherwise noted.  DIAGNOSTIC FINDINGS: 01/18/24 CT angio IMPRESSION: 1. Mild atherosclerosis without a large vessel occlusion or significant proximal stenosis in the head or neck. 2. These results were communicated to Dr. FREDRIK Hock at 5:51 PM on 01/18/2024 by secure text page via the [facility] messaging system.  01/19/24 head CT  IMPRESSION: 1. Stable head CT. No acute intracranial abnormality.  COGNITION: Overall cognitive status: Within functional limits for tasks assessed   SENSATION: WFL  COORDINATION: Dysmetric L LE   POSTURE: No Significant postural limitations   LOWER EXTREMITY MMT:    MMT Right Eval Left Eval  Hip flexion 5 3  Hip extension    Hip abduction 5 4  Hip adduction 5 3+  Hip internal rotation    Hip external rotation    Knee flexion 5 3+  Knee extension 5  3-  Ankle dorsiflexion 5 3+  Ankle plantarflexion    Ankle inversion    Ankle eversion    (Blank rows = not tested)  BED MOBILITY:  independent  GAIT: Findings: Gait Characteristics: step through pattern, decreased arm swing- Left, decreased stance time- Left, Left foot flat, knee flexed in stance- Left, trunk flexed, narrow BOS, and poor foot clearance- Left, Distance walked: clinic, Assistive device utilized:None, and Level of assistance: SBA and CGA                                                                                                                              TREATMENT  Self care/home management  - incr risk of depression s/p CVA -importance of balancing rest with activity   PATIENT EDUCATION: Education details: PT POC, exam findings, see above Person educated: Patient Education method: Explanation Education comprehension: verbalized understanding and needs further education  HOME EXERCISE PROGRAM: To be provided  GOALS: Goals reviewed with patient? Yes  SHORT TERM GOALS: Target date: 03/05/24  Pt will be independent with initial HEP for improved functional strength and balance  Baseline: to be provided Goal status: INITIAL  2.  Pt will improve 5x STS to </= 25 sec to demo improved functional LE strength and balance   Baseline: 29.22s B UE + CGA Goal status: INITIAL  3.  TUG goal  Baseline: to be assessed Goal status: INITIAL  4.  FGA goal  Baseline: to be assessed  Goal status: INITIAL   LONG TERM GOALS: Target date: 04/02/24   Pt will be independent with final HEP for improved functional strength and balance  Baseline: to be provided Goal status: INITIAL   Pt will improve 5x STS to </= 18 sec to demo improved functional LE strength and balance   Baseline: 29.22s B UE + CGA Goal status: INITIAL  TUG goal  Baseline: to be assessed Goal status: INITIAL  FGA goal  Baseline: to be assessed  Goal status: INITIAL  ASSESSMENT:  CLINICAL  IMPRESSION: Patient is a 58 y.o. female who was seen today for physical therapy evaluation and treatment for impaired mobility s/p CVA. She presents with mild L hemiparesis, LE >UE and dysarthria vs expressive aphasia. Her imaging was largely normal but she was treated with TNK in the hospital. Eval limited by significant levels of fatigue, requesting to discontinue eval and re-schedule OT eval (scheduled for after PT eval). Five times  Sit to Stand Test (FTSS) Method: Use a straight back chair with a solid seat that is 17-18" high. Ask participant to sit on the chair with arms folded across their chest.   Instructions: "Stand up and sit down as quickly as possible 5 times, keeping your arms folded across your chest."   Measurement: Stop timing when the participant touches the chair in sitting the 5th time.  TIME: 29 sec with B UE + CGA  Cut off scores indicative of increased fall risk: >12 sec CVA, >16 sec PD, >13 sec vestibular (ANPTA Core Set of Outcome Measures for Adults with Neurologic Conditions, 2018). She does present with intermittent L knee buckle while ambulating, but able to recover without external support. She may benefit from use of an AD while her strength improves as she lives alone and is at risk for falls. She would benefit from skilled PT services to address the above mentioned deficits.   OBJECTIVE IMPAIRMENTS: decreased activity tolerance, decreased balance, decreased coordination, decreased endurance, decreased knowledge of condition, decreased knowledge of use of DME, decreased mobility, difficulty walking, decreased strength, and impaired UE functional use.   ACTIVITY LIMITATIONS: carrying, lifting, stairs, bed mobility, hygiene/grooming, locomotion level, and caring for others  PARTICIPATION LIMITATIONS: meal prep, cleaning, interpersonal relationship, driving, shopping, community activity, and occupation  PERSONAL FACTORS: Age, Behavior pattern, Fitness, Past/current  experiences, Sex, Social background, Time since onset of injury/illness/exacerbation, Transportation, and 3+ comorbidities: see above are also affecting patient's functional outcome.   REHAB POTENTIAL: Fair co-morbidities  CLINICAL DECISION MAKING: Stable/uncomplicated  EVALUATION COMPLEXITY: Low  PLAN:  PT FREQUENCY: 1x/week per patient request  PT DURATION: 8 weeks  PLANNED INTERVENTIONS: [id number]- PT Re-evaluation, 97750- Physical Performance Testing, 97110-Therapeutic exercises, 97530- Therapeutic activity, [id number]- Neuromuscular re-education, 97535- Self Care, [id number]- Manual therapy, [id number]- Gait training, [phone]- Orthotic Initial, [phone]- Orthotic/Prosthetic subsequent, [phone]- Canalith repositioning, [id number]- Aquatic Therapy, [phone]- Electrical stimulation (manual), [phone] (1-2 muscles), [phone] (3+ muscles)- Dry Needling, Patient/Family education, Balance training, Stair training, Vestibular training, Visual/preceptual remediation/compensation, Cognitive remediation, and DME instructions  PLAN FOR NEXT SESSION: TUG + FGA and goal, HEP for L NMR/functional strength, normal imaging + psych- may have some level of functional component? Given endurance, maybe try rollator?    Delon DELENA Pop, PT Delon DELENA Pop, PT, DPT, CBIS, CFVRS  02/04/2024, 10:57 AM

## 2024-02-09 ENCOUNTER — Telehealth: Payer: Self-pay | Admitting: Neurology

## 2024-02-09 ENCOUNTER — Ambulatory Visit: Admitting: Occupational Therapy

## 2024-02-09 ENCOUNTER — Encounter: Payer: Self-pay | Admitting: Occupational Therapy

## 2024-02-09 ENCOUNTER — Other Ambulatory Visit: Payer: Self-pay | Admitting: Neurology

## 2024-02-09 DIAGNOSIS — R208 Other disturbances of skin sensation: Secondary | ICD-10-CM

## 2024-02-09 DIAGNOSIS — M6281 Muscle weakness (generalized): Secondary | ICD-10-CM

## 2024-02-09 DIAGNOSIS — R29818 Other symptoms and signs involving the nervous system: Secondary | ICD-10-CM

## 2024-02-09 DIAGNOSIS — R29898 Other symptoms and signs involving the musculoskeletal system: Secondary | ICD-10-CM

## 2024-02-09 DIAGNOSIS — I6300 Cerebral infarction due to thrombosis of unspecified precerebral artery: Secondary | ICD-10-CM

## 2024-02-09 DIAGNOSIS — M545 Low back pain, unspecified: Secondary | ICD-10-CM

## 2024-02-09 DIAGNOSIS — R278 Other lack of coordination: Secondary | ICD-10-CM | POA: Diagnosis not present

## 2024-02-09 DIAGNOSIS — Z9889 Other specified postprocedural states: Secondary | ICD-10-CM

## 2024-02-09 NOTE — Therapy (Signed)
 OUTPATIENT OCCUPATIONAL THERAPY ORTHO/NEURO EVALUATION  Patient Name: Angel Price MRN: [id number] DOB:1965/09/12, 58 y.o., female Today's Date: 02/09/2024  PCP: Carlette Benita Area, MD REFERRING PROVIDER: Arlinda Buster, MD/  END OF SESSION:  OT End of Session - 02/09/24 1404     Visit Number 1    Number of Visits 20   including eval   Date for Recertification  04/16/24    Authorization Type [employer] Dual    Authorization Time Period NO AUTH REQUIRED    Authorization - Number of Visits --    OT Start Time 1405    OT Stop Time 1445    OT Time Calculation (min) 40 min    Equipment Utilized During Treatment testing materials    Activity Tolerance Patient tolerated treatment well    Behavior During Therapy [facility] for tasks assessed/performed         Past Medical History:  Diagnosis Date   Abscess    soft tissue   Adrenal mass    Alcohol abuse    Anemia    Anxiety    Arrhythmia    Blood transfusion without reported diagnosis    Chronic abdominal pain    Chronic wound infection of abdomen    Colon polyp    colonoscopy 04/2014   Coronary artery disease    Mild non obstr CAD   Depression    Discoid lupus erythematosus    Diverticulosis    colonoscopy 04/2014 moderat pan colonic   Drug-seeking behavior    Gastritis    EGD 05/2014   Gastroparesis 01/2014   GERD (gastroesophageal reflux disease)    Hemorrhoid    internal large   Hiatal hernia    History of Billroth II operation    Hypertension    Lung nodule    CT 02/2014 needs repeat 1 month   Lung nodule < 6cm on CT 04/25/2014   Lupus    Malingering    Migraine    Nausea and vomiting    chronic, recurrent   Pancreatitis    Schatzki's ring    patent per EGD 04/2014   Sickle cell trait    Stroke [facility])    Suicide attempt (HCC)    Thyroid  disease 2000   overactive, radiation   Past Surgical History:  Procedure Laterality Date   ABDOMINAL HYSTERECTOMY  2013   [place]   ABDOMINAL HYSTERECTOMY      ABDOMINAL SURGERY     ADRENALECTOMY Right    AGILE CAPSULE N/A 01/05/2015   Procedure: AGILE CAPSULE;  Surgeon: Lamar CHRISTELLA Hollingshead, MD;  [facility];  Service: Endoscopy;  Laterality: N/A;  0700   BALLOON DILATION N/A 10/01/2021   Procedure: BALLOON DILATION;  Surgeon: Cindie Carlin POUR, DO;  [facility];  Service: Endoscopy;  Laterality: N/A;   Billroth II procedure      [place], first 2000, 2005/2006.   bilroth 2     BIOPSY  05/20/2013   Procedure: BIOPSIES OF ASCENDING AND SIGMOID COLON;  Surgeon: Lamar CHRISTELLA Hollingshead, MD;  Location: [facility];  Service: Endoscopy;;   BIOPSY  04/26/2014   Procedure: BIOPSIES;  Surgeon: Margo LITTIE Haddock, MD;  Location: [facility];  Service: Endoscopy;;   BIOPSY  12/24/2018   Procedure: BIOPSY;  Surgeon: Hollingshead Lamar CHRISTELLA, MD;  [facility];  Service: Endoscopy;;  colon   CARPAL TUNNEL RELEASE Left 01/09/2024   Procedure: RELEASE, CARPAL TUNNEL, ENDOSCOPIC;  Surgeon: Arlinda Buster, MD;  Location: [facility];  Service: Orthopedics;  Laterality: Left;   CHOLECYSTECTOMY     COLONOSCOPY     in [place]   COLONOSCOPY N/A 12/16/2023   Procedure: COLONOSCOPY;  Surgeon: Cindie Carlin POUR, DO;  [facility];  Service: Endoscopy;  Laterality: N/A;  145pm, ok rm 1   COLONOSCOPY WITH PROPOFOL  N/A 05/20/2013   Dr.Rourk- inadequate prep, normal appearing rectum, grossly normal colon aside from pancolonic diverticula, normal terminal ileum bx= unremarkable colonic mucosa. Due for early interval 2016.    COLONOSCOPY WITH PROPOFOL  N/A 04/26/2014   DOQ:wnmfjo ileum/one colon polyp removed/moderate pan-colonic diverticulosis/large internal hemorrhoids   COLONOSCOPY WITH PROPOFOL  N/A 12/23/2014   Dr.Rourk- minimal internal hemorrhoids, pancolonic diverticulosis   COLONOSCOPY WITH PROPOFOL  N/A 08/23/2016   Procedure: COLONOSCOPY WITH PROPOFOL ;  Surgeon: Harvey Margo CROME, MD;  [facility];  Service: Endoscopy;   Laterality: N/A;   COLONOSCOPY WITH PROPOFOL  N/A 12/24/2018   Pancolonic diverticulosis, normal TI, one 5 mm polyp in rectum (tubular adenoma). Somewhat friable hemorrhagic mucosa in area of IC valve/cecum, possibly related to scope trauma s/p biopsy. Repeat in 7 years.   DEBRIDEMENT OF ABDOMINAL WALL ABSCESS N/A 02/08/2013   Procedure: DEBRIDEMENT OF ABDOMINAL WALL ABSCESS;  Surgeon: Oneil DELENA Budge, MD;  Location: [facility];  Service: General;  Laterality: N/A;   ESOPHAGEAL DILATION N/A 12/16/2023   Procedure: DILATION, ESOPHAGUS;  Surgeon: Cindie Carlin POUR, DO;  [facility];  Service: Endoscopy;  Laterality: N/A;   ESOPHAGOGASTRODUODENOSCOPY N/A 12/16/2023   Procedure: EGD (ESOPHAGOGASTRODUODENOSCOPY);  Surgeon: Cindie Carlin POUR, DO;  [facility];  Service: Endoscopy;  Laterality: N/A;   ESOPHAGOGASTRODUODENOSCOPY (EGD) WITH PROPOFOL  N/A 05/20/2013   Dr.Rourk- s/p prior gastric surgery with normal esophagus, residual gastric mucosa and patent efferent limb   ESOPHAGOGASTRODUODENOSCOPY (EGD) WITH PROPOFOL  N/A 02/03/2014   Dr. Shaaron:  s/p hemigastrectomy with retained gastric contents. Residual gastric mucosa and efferent limb appeared normal otherwise. Query gastroparesis.    ESOPHAGOGASTRODUODENOSCOPY (EGD) WITH PROPOFOL  N/A 04/26/2014   DOQ:dryjusxp'd ring/small HH/mild non-erosive gasrtitis/normal anastomosis   ESOPHAGOGASTRODUODENOSCOPY (EGD) WITH PROPOFOL  N/A 12/23/2014   Dr.Rourk- s/p prior hemigastrctomy, active oozing from anastomotic suture site, hemostasis achieved   ESOPHAGOGASTRODUODENOSCOPY (EGD) WITH PROPOFOL  N/A 05/23/2016   Dr. 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    ESOPHAGOGASTRODUODENOSCOPY (EGD) WITH PROPOFOL  N/A 05/11/2017   Procedure: ESOPHAGOGASTRODUODENOSCOPY (EGD) WITH PROPOFOL ;  Surgeon: Shaaron Lamar HERO, MD;  [facility];  Service: Endoscopy;   Laterality: N/A;   ESOPHAGOGASTRODUODENOSCOPY (EGD) WITH PROPOFOL  N/A 06/07/2021   Procedure: ESOPHAGOGASTRODUODENOSCOPY (EGD) WITH PROPOFOL ;  Surgeon: Shaaron Lamar HERO, MD;  [facility];  Service: Endoscopy;  Laterality: N/A;  11:45am   ESOPHAGOGASTRODUODENOSCOPY (EGD) WITH PROPOFOL  N/A 10/01/2021   Procedure: ESOPHAGOGASTRODUODENOSCOPY (EGD) WITH PROPOFOL ;  Surgeon: Cindie Carlin POUR, DO;  [facility];  Service: Endoscopy;  Laterality: N/A;  3:00pm   EXCISION OF MESH  08/2019   FLEXIBLE SIGMOIDOSCOPY N/A 12/17/2023   Procedure: KINGSTON SIDE;  Surgeon: Shaaron Lamar HERO, MD;  [facility];  Service: Endoscopy;  Laterality: N/A;   HEMORRHOID SURGERY N/A 06/18/2017   Procedure: THREE COLUMN EXTENSIVE HEMORRHOIDECTOMY;  Surgeon: Kallie Manuelita BROCKS, MD;  Location: [facility];  Service: General;  Laterality: N/A;   HERNIA REPAIR     INCISION AND DRAINAGE ABSCESS N/A 01/06/2017   Procedure: INCISION AND DRAINAGE ABDOMINAL WALL ABSCESS;  Surgeon: Budge Oneil, MD;  Location: [facility];  Service: General;  Laterality: N/A;   incisional hernial repair  09/05/2021   [place]   LEFT HEART CATH AND CORONARY ANGIOGRAPHY N/A 05/09/2020   Procedure: LEFT HEART CATH AND CORONARY ANGIOGRAPHY;  Surgeon: Elmira Newman PARAS, MD;  [facility];  Service: Cardiovascular;  Laterality: N/A;   LEFT HEART CATH AND CORONARY ANGIOGRAPHY N/A 08/22/2023   Procedure: LEFT HEART CATH AND CORONARY ANGIOGRAPHY;  Surgeon: Verlin Lonni BIRCH, MD;  Location: [facility];  Service: Cardiovascular;  Laterality: N/A;   POLYPECTOMY  12/24/2018   Procedure: POLYPECTOMY;  Surgeon: Shaaron Lamar HERO, MD;  [facility];  Service: Endoscopy;;  colon   tendon repar Right    wrist   WOUND EXPLORATION Right 06/24/2013   Procedure: exploration of traumatic wound right wrist;  Surgeon: Franky JONELLE Curia, MD;  [facility];  Service: Orthopedics;  Laterality: Right;    Patient Active Problem List   Diagnosis Date Noted   Stroke-like episode 01/20/2024   Carpal tunnel syndrome of left wrist 01/09/2024   Acute lower GI bleeding 12/16/2023   B12 deficiency 11/26/2023   Iron deficiency 11/26/2023   Chest pain 08/21/2023   Dyslipidemia 08/21/2023   Rectal pressure 08/05/2023   Abdominal bloating 08/05/2023   Anemia 08/05/2023   High risk medication use 05/05/2023   Unilateral primary osteoarthritis, left knee 05/05/2023   Seropositive rheumatoid arthritis (HCC) 05/02/2022   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   Cellulitis 10/06/2021   Paroxysmal atrial fibrillation (HCC) 10/06/2021   Abdominal fluid collection 10/06/2021   Constipation 09/14/2021   Ventral hernia 05/15/2021   Dysphagia 05/15/2021   Cigarette nicotine  dependence without complication 09/12/2020   Continuous dependence on cigarette smoking 08/22/2020   Mixed hyperlipidemia 04/27/2020   Recurrent incisional hernia 02/03/2020   Bilateral knee pain 12/29/2019   Coronary artery disease of native artery of native heart with stable angina pectoris 06/25/2019   Acute respiratory failure with hypoxia (HCC) 05/20/2019   Essential hypertension 05/20/2019   COVID-19 05/20/2019   Syncope and collapse 05/20/2019   Metabolic acidosis 05/16/2019   Intractable vomiting with nausea 02/23/2019   Cocaine abuse (HCC)    Colitis 11/11/2018   Hemorrhoidal skin tag    Folate deficiency 05/11/2017   Abdominal wall fistula    Cellulitis of abdominal wall 01/02/2017   Acute left hemiparesis (HCC) 12/05/2016   Malingering 12/05/2016   Weakness of left lower extremity 12/02/2016   CVA (cerebral vascular accident) (HCC) 11/23/2016   Bright red rectal bleeding 08/22/2016   Hypotension due to blood loss    Acute GI bleeding 05/24/2016   Dieulafoy lesion of duodenum    History of Billroth II operation    Gastrointestinal hemorrhage 05/22/2016   Absolute anemia    Acute pancreatitis  10/01/2015   Alcohol abuse with intoxication    Left-sided weakness    Psychosomatic factor in physical condition    Upper GI bleed    Diverticulosis of colon with hemorrhage    Chronic wound infection of abdomen 12/22/2014   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   HTN (hypertension) 12/22/2014   Ataxia 11/01/2014   Hemorrhoids 04/26/2014   Lung nodule 04/26/2014   Diverticulosis    Gastritis    Hiatal hernia    Schatzki's ring    Acute blood loss anemia 04/25/2014   Hypokalemia 04/25/2014   Hematemesis with nausea 04/25/2014   Lung nodule < 6cm on CT 04/25/2014   Intractable nausea and vomiting 04/24/2014   Gastroparesis    Abdominal pain  04/01/2014   Gastroenteritis 12/10/2013   Chronic abdominal wound infection 08/16/2013   MDD (major depressive disorder), recurrent episode, severe (HCC) 06/27/2013   Wrist laceration 06/24/2013   Diarrhea 05/07/2013   Rectal bleeding 05/07/2013   Adrenal mass, left 05/07/2013   Post-operative wound abscess 04/19/2013   Abdominal wall abscess 04/18/2013   Essential hypertension, benign 03/30/2013   History of cocaine abuse (HCC) 03/16/2013   History of schizoaffective disorder 03/16/2013   Bipolar disorder (HCC) 03/16/2013   Personality disorder (HCC) 03/16/2013   Tobacco use disorder 03/16/2013   Alcohol abuse 03/16/2013   Palpitations 03/16/2013   Poor vision 03/16/2013   History of gastric bypass 03/16/2013   Status post hysterectomy with oophorectomy 03/16/2013   Hypothyroid 03/16/2013   SLE (systemic lupus erythematosus) (HCC) 03/16/2013    ONSET DATE: 12/25/2023 referral date, 01/09/24 surgery date  REFERRING DIAG: 12/25/23 REFERRAL: G56.03 (ICD-10-CM) - Bilateral carpal tunnel syndrome Per Dr. Erwin: Needs OT 5-6 days s/p left endoscopic CTR (01/09/24)  02/09/24 REFERRAL: I63.00 (ICD-10-CM) - Cerebrovascular accident (CVA) due to thrombosis of precerebral artery   THERAPY DIAG:  Muscle weakness (generalized)  Other lack of  coordination  Other disturbances of skin sensation  S/P carpal tunnel release  Other symptoms and signs involving the musculoskeletal system  Other symptoms and signs involving the nervous system  Low back pain, unspecified back pain laterality, unspecified chronicity, unspecified whether sciatica present  SUBJECTIVE:   SUBJECTIVE STATEMENT: Pt reports she is feeling so-so ie) her strength comes and goes but she has been trying to use her L hand more since she is left handed.  Pt reports she has an MD appt Dec 3rd to set up for R CTS.  Pt reports she is not wearing any splints and reports sensitivity around the L scar site.  Pt accompanied by: self  PERTINENT HISTORY: Pt presented to OT 01/15/24 after 01/09/24 CTR surgery on L hand.  She then ended up in the hospital 01/18/24 with stroke-like episode.  She has B CTS and anticipates surgery on the R hand upcoming. Other PMHx includes: schizoaffective disorder, CAD, HTN, afib, lupus, CVA  Per MD DC from hospital 01/20/24: 58 y.o. female with history of CVA, HTN, CAD, DLE, migraines, anxiety/depression who presented 11/2 with left-sided weakness and headache, bilateral visual changes. Administered TNK. Patient endorses history of stroke, no previous imaging seen on chart review. She was admitted for post-TNK monitoring and further secondary stroke prevention workup.   PRECAUTIONS: None  RED FLAGS: None   WEIGHT BEARING RESTRICTIONS: s/p CTR L wrist   REFER TO IHP PGS [phone]  PAIN:  Are you having pain? Yes: NPRS scale: 8/10 - back; hand aches at night Pain location: low back & L wrist  Pain description: achy Aggravating factors: movement Relieving factors: back - not moving; shake the wrist  FALLS: Has patient fallen in last 6 months? No  LIVING ENVIRONMENT: Lives with: lives alone Lives in: House/apartment apartment, ground level Stairs: No Has following equipment at home: Vannie - 4 wheeled; 3 in 1 delivered day of  eval  PLOF: Has an aide, helps with cleaning and cooking, occasionally helps with dressing, has lupus flare ups and hx of stroke, and aide drives her to appts. 7x/days week for 1.5 hours-2 hours  PATIENT GOALS: Pt reports she loves to cook and reports that her hands are weak and hard to open. Wants to get back to doing the things she was doing before with less help - especially cook like she  used to but she needs to get her strength and coordination back.   She also wants to talk better and socialize without hurting.  C  NEXT MD VISIT: 02/11/24 - back; 02/18/24 - Dr. Erwin  OBJECTIVE:  Note: Objective measures were completed at Evaluation unless otherwise noted.  HAND DOMINANCE: Left  ADLs: Overall ADLs: Basically WFL - just slower than before.  FUNCTIONAL OUTCOME MEASURES: 01/15/24 Quick Dash: 84.1% impairment 02/09/24 Quick Dash: 56.8%   UPPER EXTREMITY ROM:     Active ROM Right eval Left eval  Shoulder flexion WFL 90* - higher makes her back spasm/pull  Shoulder abduction    Shoulder adduction    Shoulder extension    Shoulder internal rotation    Shoulder external rotation    Elbow flexion WNL WNL  Elbow extension WNL WNL  Wrist flexion    Wrist extension    Wrist ulnar deviation    Wrist radial deviation    Wrist pronation    Wrist supination    (Blank rows = not tested)   UPPER EXTREMITY MMT:     MMT Right eval Left eval  Shoulder flexion 3 3-  Shoulder abduction    Shoulder adduction    Shoulder extension    Shoulder internal rotation    Shoulder external rotation    Middle trapezius    Lower trapezius    Elbow flexion 5 4  Elbow extension 5 4  Wrist flexion    Wrist extension    Wrist ulnar deviation    Wrist radial deviation    Wrist pronation    Wrist supination    (Blank rows = not tested)  HAND FUNCTION: 01/15/24 Grip strength: Right: 7.4 lbs; Left: 5.2 lbs  02/09/24: Right 32.4, 37.2, 36.3; Left 22.2, 32.4, 33.7 Average: Right 35.3  lbs; Left 29.4 lbs  COORDINATION: 01/15/24 9 Hole Peg test: Right: 36.86 sec; Left: 42.21 sec Box and Blocks:  Right 40 blocks, Left 52 blocks  02/09/24: 9 hole peg test: Right: 27.96 sec Left: 28.18 sec - improved but pt reports it still feels weird and still dropping pegs  SENSATION: Light touch - thumb, ring and pinky feel different on R side - since the stroke.  EDEMA: Pt has reported swelling in B hands, reports it comes and goes.  COGNITION: Overall cognitive status: Within functional limits for tasks assessed   OBSERVATIONS: Pain in B hands and wrists d/t bilateral CTS and recent L CTR.    TREATMENT DATE: 02/09/24                                                                                                                           - Self Care education and training completed for duration as noted below including:  OT educated pt on rehabilitation process and results of objective measures in relation to pt specific goals. Pt in agreement at this time. Pt educated to continue to complete previously provided stretches and exercises per IHP protocol, see  pt instructions for handout 01/15/24.   Educated pt in scar massage and desensitization of scar as gentle touch was tender to pt today.  OT fabricated elastomer insert for pt to wear inside her splint at night to work on scar remodeling and desensitization.  Pt verbalized understanding with all education.  PATIENT EDUCATION: Education details: OT role and POC Considerations Person educated: Patient Education method: Explanation, Demonstration Education comprehension: verbalized understanding, returned demonstration, and needs further education  HOME EXERCISE PROGRAM: 01/15/24: IHP Stretches p 15-16  GOALS: Goals reviewed with patient? Yes  SHORT TERM GOALS: Target date: 03/16/24  Pt will be independent with HEP for wrist ROM, hand strength, and coordination Baseline: Initiated  Goal status: IN Progress  2.  Pt  will be independent with scar tissue massage and edema mgmt Baseline: To be instructed  Goal status: IN Progress  3.  Pt will independently recall at least 3 joint protection, ergonomics, and body mechanic principles including 3 options for adaptive equipment to assist with daily tasks with increased comfort and confidence with ADLs/IADLS with minimal pain discomfort of back and hands.  Baseline: To be educated  Goal status: INITIAL  4.  Patient will independently recall 3 sleep hygiene strategies to improve the quality, duration, and consistency of sleep, which supports mental, emotional, and physical health.  Baseline: New to outpt OT  Goal status: INITIAL   LONG TERM GOALS: Target date: 04/16/24  Patient will demonstrate at least 25% improvement with quick Dash score (reporting <30% disability or less) indicating improved functional use of affected extremity.  Baseline: 56.8% impairment Goal status: INITIAL  2.  Pt will complete light meal prep with reports of pain no more than 3/10 Baseline: Pt currently unable to complete cooking tasks but wants to return to this Goal status: INITIAL  3.  Patient will demonstrate at least 5-10 lb improvement in dominant LUE grip strength as needed to open jars and other containers. Baseline: Right 35.3 lbs; Left 29.4 lbs Goal status: INITIAL   4.  Pt will be independent with home based pain management routine for back and hands to potentially include gloves/splints, heat and joint protection principles for minimal pain. Baseline: 8/10 Goal status: INITIAL   ASSESSMENT:  CLINICAL IMPRESSION: Patient is a 58 y.o. female who was seen today for occupational therapy evaluation for L CTR and bilateral CTS and then a recent stroke affecting the same side. Hx includes lupus, CVA. Patient currently presents below baseline level of function demonstrating functional deficits and impairments as noted below. Pt would benefit from skilled OT services in the  outpatient setting to work on impairments as noted below to help pt return to PLOF as able.     PERFORMANCE DEFICITS: in functional skills including ADLs, IADLs, coordination, dexterity, sensation, edema, strength, pain, Fine motor control, decreased knowledge of use of DME, skin integrity, and UE functional use,  and psychosocial skills including coping strategies, environmental adaptation, habits, and routines and behaviors.   IMPAIRMENTS: are limiting patient from ADLs, IADLs, rest and sleep, and leisure.   COMORBIDITIES: has co-morbidities such as lupus that affects occupational performance. Patient will benefit from skilled OT to address above impairments and improve overall function.  MODIFICATION OR ASSISTANCE TO COMPLETE EVALUATION: Min-Moderate modification of tasks or assist with assess necessary to complete an evaluation.  OT OCCUPATIONAL PROFILE AND HISTORY: Detailed assessment: Review of records and additional review of physical, cognitive, psychosocial history related to current functional performance.  CLINICAL DECISION MAKING: Moderate - several treatment options,  min-mod task modification necessary  REHAB POTENTIAL: Good  EVALUATION COMPLEXITY: Moderate      PLAN:  OT FREQUENCY: 1-2x/week  OT DURATION: 10 weeks  PLANNED INTERVENTIONS: 97168 OT Re-evaluation, 97535 self care/ADL training, 02889 therapeutic exercise, 97530 therapeutic activity, 97140 manual therapy, 97035 ultrasound, 97018 paraffin, 02960 fluidotherapy, 97010 moist heat, 97010 cryotherapy, scar mobilization, passive range of motion, energy conservation, coping strategies training, patient/family education, and DME and/or AE instructions  RECOMMENDED OTHER SERVICES: Pt had PT eval and ST eval is scheduled  CONSULTED AND AGREED WITH PLAN OF CARE: Patient  PLAN FOR NEXT SESSION:  scar tissue massage and desensitization Modalities as needed Median nerve glides Sleep hygiene   Angel Price,  OT 02/09/2024, 7:44 PM

## 2024-02-09 NOTE — Telephone Encounter (Signed)
 Referral for occupational therapy sent through [facility] to [facility]. Phone: [phone]

## 2024-02-09 NOTE — Telephone Encounter (Signed)
 Referral physical therapy sent through [facility] to [facility]. Phone: [phone]

## 2024-02-10 ENCOUNTER — Encounter: Payer: Self-pay | Admitting: Gastroenterology

## 2024-02-17 NOTE — Progress Notes (Unsigned)
   Angel Price - 58 y.o. female MRN [id number]  Date of birth: 1965-07-16  Office Visit Note: Visit Date: 02/18/2024 PCP: Carlette Benita Area, MD Referred by: Carlette Benita Demissie*  Subjective:  HPI: Angel Price is a 58 y.o. female who presents today for follow up 6 weeks status post left wrist endoscopic carpal tunnel release.  Pertinent ROS were reviewed with the patient and found to be negative unless otherwise specified above in HPI.   Assessment & Plan: Visit Diagnoses: No diagnosis found.  Plan: ***  Follow-up: No follow-ups on file.   Meds & Orders: No orders of the defined types were placed in this encounter.  No orders of the defined types were placed in this encounter.    Procedures: No procedures performed       Objective:   Vital Signs: LMP  (LMP Unknown)   Ortho Exam ***  Imaging: No results found.   Leasia Swann Afton Alderton, M.D. [facility], Hand Surgery

## 2024-02-18 ENCOUNTER — Ambulatory Visit: Admitting: Orthopedic Surgery

## 2024-02-18 ENCOUNTER — Ambulatory Visit

## 2024-02-18 DIAGNOSIS — Z9889 Other specified postprocedural states: Secondary | ICD-10-CM

## 2024-02-19 ENCOUNTER — Ambulatory Visit: Attending: Neurology

## 2024-02-19 ENCOUNTER — Ambulatory Visit: Admitting: Physical Therapy

## 2024-02-19 DIAGNOSIS — R278 Other lack of coordination: Secondary | ICD-10-CM | POA: Diagnosis present

## 2024-02-19 DIAGNOSIS — M6281 Muscle weakness (generalized): Secondary | ICD-10-CM | POA: Insufficient documentation

## 2024-02-19 DIAGNOSIS — Z9889 Other specified postprocedural states: Secondary | ICD-10-CM | POA: Insufficient documentation

## 2024-02-19 NOTE — Therapy (Signed)
 OUTPATIENT OCCUPATIONAL THERAPY ORTHO/NEURO TREATMENT  Patient Name: Angel Price MRN: [id number] DOB:Nov 14, 1965, 58 y.o., female Today's Date: 02/19/2024  PCP: Carlette Benita Area, MD REFERRING PROVIDER: Arlinda Buster, MD/  END OF SESSION:  OT End of Session - 02/19/24 1116     Visit Number 2    Number of Visits 20    Date for Recertification  04/16/24    Authorization Type [employer] Dual    Authorization Time Period NO AUTH REQUIRED    Authorization - Visit Number 2    Authorization - Number of Visits 21    Progress Note Due on Visit 10    OT Start Time 0932    OT Stop Time 1012    OT Time Calculation (min) 40 min    Equipment Utilized During Treatment fluidotherapy    Activity Tolerance Patient tolerated treatment well;Patient limited by pain    Behavior During Therapy [facility] for tasks assessed/performed          Past Medical History:  Diagnosis Date   Abscess    soft tissue   Adrenal mass    Alcohol abuse    Anemia    Anxiety    Arrhythmia    Blood transfusion without reported diagnosis    Chronic abdominal pain    Chronic wound infection of abdomen    Colon polyp    colonoscopy 04/2014   Coronary artery disease    Mild non obstr CAD   Depression    Discoid lupus erythematosus    Diverticulosis    colonoscopy 04/2014 moderat pan colonic   Drug-seeking behavior    Gastritis    EGD 05/2014   Gastroparesis 01/2014   GERD (gastroesophageal reflux disease)    Hemorrhoid    internal large   Hiatal hernia    History of Billroth II operation    Hypertension    Lung nodule    CT 02/2014 needs repeat 1 month   Lung nodule < 6cm on CT 04/25/2014   Lupus    Malingering    Migraine    Nausea and vomiting    chronic, recurrent   Pancreatitis    Schatzki's ring    patent per EGD 04/2014   Sickle cell trait    Stroke [facility])    Suicide attempt (HCC)    Thyroid  disease 2000   overactive, radiation   Past Surgical History:  Procedure Laterality Date    ABDOMINAL HYSTERECTOMY  2013   [place]   ABDOMINAL HYSTERECTOMY     ABDOMINAL SURGERY     ADRENALECTOMY Right    AGILE CAPSULE N/A 01/05/2015   Procedure: AGILE CAPSULE;  Surgeon: Lamar CHRISTELLA Hollingshead, MD;  [facility];  Service: Endoscopy;  Laterality: N/A;  0700   BALLOON DILATION N/A 10/01/2021   Procedure: BALLOON DILATION;  Surgeon: Cindie Carlin POUR, DO;  [facility];  Service: Endoscopy;  Laterality: N/A;   Billroth II procedure      [place], first 2000, 2005/2006.   bilroth 2     BIOPSY  05/20/2013   Procedure: BIOPSIES OF ASCENDING AND SIGMOID COLON;  Surgeon: Lamar CHRISTELLA Hollingshead, MD;  Location: [facility];  Service: Endoscopy;;   BIOPSY  04/26/2014   Procedure: BIOPSIES;  Surgeon: Margo LITTIE Haddock, MD;  Location: [facility];  Service: Endoscopy;;   BIOPSY  12/24/2018   Procedure: BIOPSY;  Surgeon: Hollingshead Lamar CHRISTELLA, MD;  [facility];  Service: Endoscopy;;  colon   CARPAL TUNNEL RELEASE Left 01/09/2024  Procedure: RELEASE, CARPAL TUNNEL, ENDOSCOPIC;  Surgeon: Arlinda Buster, MD;  Location: [facility];  Service: Orthopedics;  Laterality: Left;   CHOLECYSTECTOMY     COLONOSCOPY     in [place]   COLONOSCOPY N/A 12/16/2023   Procedure: COLONOSCOPY;  Surgeon: Cindie Carlin POUR, DO;  [facility];  Service: Endoscopy;  Laterality: N/A;  145pm, ok rm 1   COLONOSCOPY WITH PROPOFOL  N/A 05/20/2013   Dr.Rourk- inadequate prep, normal appearing rectum, grossly normal colon aside from pancolonic diverticula, normal terminal ileum bx= unremarkable colonic mucosa. Due for early interval 2016.    COLONOSCOPY WITH PROPOFOL  N/A 04/26/2014   DOQ:wnmfjo ileum/one colon polyp removed/moderate pan-colonic diverticulosis/large internal hemorrhoids   COLONOSCOPY WITH PROPOFOL  N/A 12/23/2014   Dr.Rourk- minimal internal hemorrhoids, pancolonic diverticulosis   COLONOSCOPY WITH PROPOFOL  N/A 08/23/2016   Procedure: COLONOSCOPY WITH PROPOFOL ;  Surgeon: Harvey Margo CROME, MD;  [facility];  Service: Endoscopy;  Laterality: N/A;   COLONOSCOPY WITH PROPOFOL  N/A 12/24/2018   Pancolonic diverticulosis, normal TI, one 5 mm polyp in rectum (tubular adenoma). Somewhat friable hemorrhagic mucosa in area of IC valve/cecum, possibly related to scope trauma s/p biopsy. Repeat in 7 years.   DEBRIDEMENT OF ABDOMINAL WALL ABSCESS N/A 02/08/2013   Procedure: DEBRIDEMENT OF ABDOMINAL WALL ABSCESS;  Surgeon: Oneil DELENA Budge, MD;  Location: [facility];  Service: General;  Laterality: N/A;   ESOPHAGEAL DILATION N/A 12/16/2023   Procedure: DILATION, ESOPHAGUS;  Surgeon: Cindie Carlin POUR, DO;  [facility];  Service: Endoscopy;  Laterality: N/A;   ESOPHAGOGASTRODUODENOSCOPY N/A 12/16/2023   Procedure: EGD (ESOPHAGOGASTRODUODENOSCOPY);  Surgeon: Cindie Carlin POUR, DO;  [facility];  Service: Endoscopy;  Laterality: N/A;   ESOPHAGOGASTRODUODENOSCOPY (EGD) WITH PROPOFOL  N/A 05/20/2013   Dr.Rourk- s/p prior gastric surgery with normal esophagus, residual gastric mucosa and patent efferent limb   ESOPHAGOGASTRODUODENOSCOPY (EGD) WITH PROPOFOL  N/A 02/03/2014   Dr. Shaaron:  s/p hemigastrectomy with retained gastric contents. Residual gastric mucosa and efferent limb appeared normal otherwise. Query gastroparesis.    ESOPHAGOGASTRODUODENOSCOPY (EGD) WITH PROPOFOL  N/A 04/26/2014   DOQ:dryjusxp'd ring/small HH/mild non-erosive gasrtitis/normal anastomosis   ESOPHAGOGASTRODUODENOSCOPY (EGD) WITH PROPOFOL  N/A 12/23/2014   Dr.Rourk- s/p prior hemigastrctomy, active oozing from anastomotic suture site, hemostasis achieved   ESOPHAGOGASTRODUODENOSCOPY (EGD) WITH PROPOFOL  N/A 05/23/2016   Dr. 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    ESOPHAGOGASTRODUODENOSCOPY (EGD) WITH PROPOFOL  N/A 05/11/2017   Procedure: ESOPHAGOGASTRODUODENOSCOPY (EGD) WITH PROPOFOL ;  Surgeon: Shaaron Lamar HERO, MD;  [facility];  Service: Endoscopy;  Laterality: N/A;   ESOPHAGOGASTRODUODENOSCOPY (EGD) WITH PROPOFOL  N/A 06/07/2021   Procedure: ESOPHAGOGASTRODUODENOSCOPY (EGD) WITH PROPOFOL ;  Surgeon: Shaaron Lamar HERO, MD;  [facility];  Service: Endoscopy;  Laterality: N/A;  11:45am   ESOPHAGOGASTRODUODENOSCOPY (EGD) WITH PROPOFOL  N/A 10/01/2021   Procedure: ESOPHAGOGASTRODUODENOSCOPY (EGD) WITH PROPOFOL ;  Surgeon: Cindie Carlin POUR, DO;  [facility];  Service: Endoscopy;  Laterality: N/A;  3:00pm   EXCISION OF MESH  08/2019   FLEXIBLE SIGMOIDOSCOPY N/A 12/17/2023   Procedure: KINGSTON SIDE;  Surgeon: Shaaron Lamar HERO, MD;  [facility];  Service: Endoscopy;  Laterality: N/A;   HEMORRHOID SURGERY N/A 06/18/2017   Procedure: THREE COLUMN EXTENSIVE HEMORRHOIDECTOMY;  Surgeon: Kallie Manuelita BROCKS, MD;  Location: [facility];  Service: General;  Laterality: N/A;   HERNIA REPAIR     INCISION AND DRAINAGE ABSCESS N/A 01/06/2017  Procedure: INCISION AND DRAINAGE ABDOMINAL WALL ABSCESS;  Surgeon: Mavis Anes, MD;  Location: [facility];  Service: General;  Laterality: N/A;   incisional hernial repair  09/05/2021   [place]   LEFT HEART CATH AND CORONARY ANGIOGRAPHY N/A 05/09/2020   Procedure: LEFT HEART CATH AND CORONARY ANGIOGRAPHY;  Surgeon: Elmira Newman PARAS, MD;  Location: [facility];  Service: Cardiovascular;  Laterality: N/A;   LEFT HEART CATH AND CORONARY ANGIOGRAPHY N/A 08/22/2023   Procedure: LEFT HEART CATH AND CORONARY ANGIOGRAPHY;  Surgeon: Verlin Lonni BIRCH, MD;  Location: [facility];  Service: Cardiovascular;  Laterality: N/A;   POLYPECTOMY  12/24/2018   Procedure: POLYPECTOMY;  Surgeon: Shaaron Lamar HERO, MD;  [facility];  Service: Endoscopy;;  colon   tendon repar Right    wrist   WOUND EXPLORATION Right 06/24/2013   Procedure: exploration of traumatic wound right wrist;  Surgeon: Franky JONELLE Curia, MD;   [facility];  Service: Orthopedics;  Laterality: Right;   Patient Active Problem List   Diagnosis Date Noted   Stroke-like episode 01/20/2024   Carpal tunnel syndrome of left wrist 01/09/2024   Acute lower GI bleeding 12/16/2023   B12 deficiency 11/26/2023   Iron deficiency 11/26/2023   Chest pain 08/21/2023   Dyslipidemia 08/21/2023   Rectal pressure 08/05/2023   Abdominal bloating 08/05/2023   Anemia 08/05/2023   High risk medication use 05/05/2023   Unilateral primary osteoarthritis, left knee 05/05/2023   Seropositive rheumatoid arthritis (HCC) 05/02/2022   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   Cellulitis 10/06/2021   Paroxysmal atrial fibrillation (HCC) 10/06/2021   Abdominal fluid collection 10/06/2021   Constipation 09/14/2021   Ventral hernia 05/15/2021   Dysphagia 05/15/2021   Cigarette nicotine  dependence without complication 09/12/2020   Continuous dependence on cigarette smoking 08/22/2020   Mixed hyperlipidemia 04/27/2020   Recurrent incisional hernia 02/03/2020   Bilateral knee pain 12/29/2019   Coronary artery disease of native artery of native heart with stable angina pectoris 06/25/2019   Acute respiratory failure with hypoxia (HCC) 05/20/2019   Essential hypertension 05/20/2019   COVID-19 05/20/2019   Syncope and collapse 05/20/2019   Metabolic acidosis 05/16/2019   Intractable vomiting with nausea 02/23/2019   Cocaine abuse (HCC)    Colitis 11/11/2018   Hemorrhoidal skin tag    Folate deficiency 05/11/2017   Abdominal wall fistula    Cellulitis of abdominal wall 01/02/2017   Acute left hemiparesis (HCC) 12/05/2016   Malingering 12/05/2016   Weakness of left lower extremity 12/02/2016   CVA (cerebral vascular accident) (HCC) 11/23/2016   Bright red rectal bleeding 08/22/2016   Hypotension due to blood loss    Acute GI bleeding 05/24/2016   Dieulafoy lesion of duodenum    History of Billroth II operation    Gastrointestinal hemorrhage  05/22/2016   Absolute anemia    Acute pancreatitis 10/01/2015   Alcohol abuse with intoxication    Left-sided weakness    Psychosomatic factor in physical condition    Upper GI bleed    Diverticulosis of colon with hemorrhage    Chronic wound infection of abdomen 12/22/2014   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   HTN (hypertension) 12/22/2014   Ataxia 11/01/2014   Hemorrhoids 04/26/2014   Lung nodule 04/26/2014   Diverticulosis    Gastritis    Hiatal hernia    Schatzki's ring    Acute blood loss anemia 04/25/2014   Hypokalemia 04/25/2014   Hematemesis with nausea 04/25/2014   Lung nodule < 6cm on  CT 04/25/2014   Intractable nausea and vomiting 04/24/2014   Gastroparesis    Abdominal pain 04/01/2014   Gastroenteritis 12/10/2013   Chronic abdominal wound infection 08/16/2013   MDD (major depressive disorder), recurrent episode, severe (HCC) 06/27/2013   Wrist laceration 06/24/2013   Diarrhea 05/07/2013   Rectal bleeding 05/07/2013   Adrenal mass, left 05/07/2013   Post-operative wound abscess 04/19/2013   Abdominal wall abscess 04/18/2013   Essential hypertension, benign 03/30/2013   History of cocaine abuse (HCC) 03/16/2013   History of schizoaffective disorder 03/16/2013   Bipolar disorder (HCC) 03/16/2013   Personality disorder (HCC) 03/16/2013   Tobacco use disorder 03/16/2013   Alcohol abuse 03/16/2013   Palpitations 03/16/2013   Poor vision 03/16/2013   History of gastric bypass 03/16/2013   Status post hysterectomy with oophorectomy 03/16/2013   Hypothyroid 03/16/2013   SLE (systemic lupus erythematosus) (HCC) 03/16/2013    ONSET DATE: 12/25/2023 referral date, 01/09/24 surgery date  REFERRING DIAG: 12/25/23 REFERRAL: G56.03 (ICD-10-CM) - Bilateral carpal tunnel syndrome Per Dr. Erwin: Needs OT 5-6 days s/p left endoscopic CTR (01/09/24)  02/09/24 REFERRAL: I63.00 (ICD-10-CM) - Cerebrovascular accident (CVA) due to thrombosis of precerebral artery   THERAPY DIAG:   Muscle weakness (generalized)  Other lack of coordination  S/P carpal tunnel release  SUBJECTIVE:   SUBJECTIVE STATEMENT: Pt reports her appt with orthopedist went well. She says her left hand is doing better but the right hand could use more work.  Pt accompanied by: self  PERTINENT HISTORY: Pt presented to OT 01/15/24 after 01/09/24 CTR surgery on L hand.  She then ended up in the hospital 01/18/24 with stroke-like episode.  She has B CTS and anticipates surgery on the R hand upcoming. Other PMHx includes: schizoaffective disorder, CAD, HTN, afib, lupus, CVA  Per MD DC from hospital 01/20/24: 58 y.o. female with history of CVA, HTN, CAD, DLE, migraines, anxiety/depression who presented 11/2 with left-sided weakness and headache, bilateral visual changes. Administered TNK. Patient endorses history of stroke, no previous imaging seen on chart review. She was admitted for post-TNK monitoring and further secondary stroke prevention workup.   PRECAUTIONS: None  RED FLAGS: None   WEIGHT BEARING RESTRICTIONS: s/p CTR L wrist   REFER TO [facility] PGS [phone]  PAIN:  Are you having pain? Yes: NPRS scale: 3/10- aching but d/t weather Pain location: L hand  Pain description: achy Aggravating factors: movement Relieving factors: shaking the wrist  FALLS: Has patient fallen in last 6 months? No  LIVING ENVIRONMENT: Lives with: lives alone Lives in: House/apartment apartment, ground level Stairs: No Has following equipment at home: Vannie - 4 wheeled; 3 in 1 delivered day of eval  PLOF: Has an aide, helps with cleaning and cooking, occasionally helps with dressing, has lupus flare ups and hx of stroke, and aide drives her to appts. 7x/days week for 1.5 hours-2 hours  PATIENT GOALS: Pt reports she loves to cook and reports that her hands are weak and hard to open. Wants to get back to doing the things she was doing before with less help - especially cook like she used to but she needs to get  her strength and coordination back.   She also wants to talk better and socialize without hurting.  C  NEXT MD VISIT: 02/11/24 - back; 02/18/24 - Dr. Erwin  OBJECTIVE:  Note: Objective measures were completed at Evaluation unless otherwise noted.  HAND DOMINANCE: Left  ADLs: Overall ADLs: Basically WFL - just slower than before.  FUNCTIONAL  OUTCOME MEASURES: 01/15/24 Quick Dash: 84.1% impairment 02/09/24 Quick Dash: 56.8%   UPPER EXTREMITY ROM:     Active ROM Right eval Left eval  Shoulder flexion WFL 90* - higher makes her back spasm/pull  Shoulder abduction    Shoulder adduction    Shoulder extension    Shoulder internal rotation    Shoulder external rotation    Elbow flexion WNL WNL  Elbow extension WNL WNL  Wrist flexion    Wrist extension    Wrist ulnar deviation    Wrist radial deviation    Wrist pronation    Wrist supination    (Blank rows = not tested)   UPPER EXTREMITY MMT:     MMT Right eval Left eval  Shoulder flexion 3 3-  Shoulder abduction    Shoulder adduction    Shoulder extension    Shoulder internal rotation    Shoulder external rotation    Middle trapezius    Lower trapezius    Elbow flexion 5 4  Elbow extension 5 4  Wrist flexion    Wrist extension    Wrist ulnar deviation    Wrist radial deviation    Wrist pronation    Wrist supination    (Blank rows = not tested)  HAND FUNCTION: 01/15/24 Grip strength: Right: 7.4 lbs; Left: 5.2 lbs  02/09/24: Right 32.4, 37.2, 36.3; Left 22.2, 32.4, 33.7 Average: Right 35.3 lbs; Left 29.4 lbs  02/19/24: Right: 21.1, 20.7, 20.5; Left: 35.2, 35, 31.9 Average Right: 20.8 lbs, Left: 34 lbs   COORDINATION: 01/15/24 9 Hole Peg test: Right: 36.86 sec; Left: 42.21 sec Box and Blocks:  Right 40 blocks, Left 52 blocks  02/09/24: 9 hole peg test: Right: 27.96 sec Left: 28.18 sec - improved but pt reports it still feels weird and still dropping pegs  SENSATION: Light touch - thumb, ring and pinky  feel different on R side - since the stroke.  EDEMA: Pt has reported swelling in B hands, reports it comes and goes.  COGNITION: Overall cognitive status: Within functional limits for tasks assessed   OBSERVATIONS: Pain in B hands and wrists d/t bilateral CTS and recent L CTR.    TREATMENT DATE: 02/19/24:                                                                                                                     Therapeutic exercise completed for duration as noted below including:   Pt educated in benefits of fluidotherapy ofr reducing pain in L hand/wrist, pt agreed to attempt. While in fluidothreapy for 8 minutes, completed tendon glides of L wrist and hand to improve circulation in and promote tendons moving freely in carpal tunnel area.  Attempted theraputty exercises for B hands, however pt c/o soreness and pain when completing even when grading down form red putty to yellow putty, therefore ceased. Completed 15 gross grasp with 1.5 lb resistance Digiflex B hands. Pt also instructed in prayer stretches. Reviewed [facility] handout exercises, min cues to hold each  end position.  Self care completed including: Checked grip strength of B hands, noted increase in L grip strength but decrease in R hand. Pt reports The left hand is doing better but I want to work on the right strength. Educated pt in pillar pain as she reported pain in this area after engaging in theraputty exercises, encouraged to consider gloves with padding in palm area for pressure relief if needed. Also educated in benefits of ultrasound for pillar pain relief, pt reported I'd like to try that next time.   Educated pt in sleep hygiene and sleep positioning for reducing pressure off of wrists and elbows to promote a good night's sleep. See Pt instructions for handout.     PATIENT EDUCATION: Education details: Benefits of fluidotherapy, sleep hygiene/positioning, pillar pain Person educated: Patient Education  method: Explanation, Demonstration Education comprehension: verbalized understanding, returned demonstration, and needs further education  HOME EXERCISE PROGRAM: 01/15/24: [facility] Stretches p 15-16   GOALS: Goals reviewed with patient? Yes  SHORT TERM GOALS: Target date: 03/16/24  Pt will be independent with HEP for wrist ROM, hand strength, and coordination Baseline: Initiated  Goal status: IN Progress  2.  Pt will be independent with scar tissue massage and edema mgmt Baseline: To be instructed  Goal status: IN Progress  3.  Pt will independently recall at least 3 joint protection, ergonomics, and body mechanic principles including 3 options for adaptive equipment to assist with daily tasks with increased comfort and confidence with ADLs/IADLS with minimal pain discomfort of back and hands.  Baseline: To be educated  Goal status: IN PROGRESS  4.  Patient will independently recall 3 sleep hygiene strategies to improve the quality, duration, and consistency of sleep, which supports mental, emotional, and physical health.  Baseline: New to outpt OT  Goal status: IN PROGRESS   LONG TERM GOALS: Target date: 04/16/24  Patient will demonstrate at least 25% improvement with quick Dash score (reporting <30% disability or less) indicating improved functional use of affected extremity.  Baseline: 56.8% impairment Goal status: IN PROGRESS  2.  Pt will complete light meal prep with reports of pain no more than 3/10 Baseline: Pt currently unable to complete cooking tasks but wants to return to this Goal status: IN PROGRESS  3.  Patient will demonstrate at least 5-10 lb improvement in dominant LUE grip strength as needed to open jars and other containers. Baseline: Right 35.3 lbs; Left 29.4 lbs 02/19/24: Right: 20.8 lbs, Left: 34 lbs Goal status: IN PROGRESS   4.  Pt will be independent with home based pain management routine for back and hands to potentially include gloves/splints, heat and  joint protection principles for minimal pain. Baseline: 8/10 Goal status: IN PROGRESS   ASSESSMENT:  CLINICAL IMPRESSION: Patient is a 58 y.o. female who was seen today for occupational therapy tx for L CTR and bilateral CTS and then a recent stroke affecting the same side. Hx includes lupus, CVA. Pt successfully completing scar tissue massage every day per pt report, still reporting some pillar pain and now presents with weakness in R hand compared to L hand. Pt would benefit from skilled OT services in the outpatient setting to work on impairments as noted below to help pt return to PLOF as able.     PERFORMANCE DEFICITS: in functional skills including ADLs, IADLs, coordination, dexterity, sensation, edema, strength, pain, Fine motor control, decreased knowledge of use of DME, skin integrity, and UE functional use,  and psychosocial skills including coping strategies, environmental  adaptation, habits, and routines and behaviors.   IMPAIRMENTS: are limiting patient from ADLs, IADLs, rest and sleep, and leisure.   COMORBIDITIES: has co-morbidities such as lupus that affects occupational performance. Patient will benefit from skilled OT to address above impairments and improve overall function.  MODIFICATION OR ASSISTANCE TO COMPLETE EVALUATION: Min-Moderate modification of tasks or assist with assess necessary to complete an evaluation.  OT OCCUPATIONAL PROFILE AND HISTORY: Detailed assessment: Review of records and additional review of physical, cognitive, psychosocial history related to current functional performance.  CLINICAL DECISION MAKING: Moderate - several treatment options, min-mod task modification necessary  REHAB POTENTIAL: Good  EVALUATION COMPLEXITY: Moderate      PLAN:  OT FREQUENCY: 1-2x/week  OT DURATION: 10 weeks  PLANNED INTERVENTIONS: 97168 OT Re-evaluation, 97535 self care/ADL training, 02889 therapeutic exercise, 97530 therapeutic activity, 97140 manual  therapy, 97035 ultrasound, 97018 paraffin, 02960 fluidotherapy, 97010 moist heat, 97010 cryotherapy, scar mobilization, passive range of motion, energy conservation, coping strategies training, patient/family education, and DME and/or AE instructions  RECOMMENDED OTHER SERVICES: Pt had PT eval and ST eval is scheduled  CONSULTED AND AGREED WITH PLAN OF CARE: Patient  PLAN FOR NEXT SESSION:  Ultrasound for pillar pain Monitor hand pain and assign putty HEP when no pain Review Median nerve glides Joint protection education   Rocky Dutch, OT 02/19/2024, 11:17 AM

## 2024-02-19 NOTE — Patient Instructions (Addendum)
   WEBSITE: [email or web address]

## 2024-02-19 NOTE — Therapy (Incomplete)
 OUTPATIENT PHYSICAL THERAPY NEURO TREATMENT   Patient Name: Angel Price MRN: [id number] DOB:Aug 31, 1965, 58 y.o., female Today's Date: 02/19/2024   PCP: Benita Trixie Outhouse, MD REFERRING PROVIDER: Eather Popp, MD  END OF SESSION:    Past Medical History:  Diagnosis Date   Abscess    soft tissue   Adrenal mass    Alcohol abuse    Anemia    Anxiety    Arrhythmia    Blood transfusion without reported diagnosis    Chronic abdominal pain    Chronic wound infection of abdomen    Colon polyp    colonoscopy 04/2014   Coronary artery disease    Mild non obstr CAD   Depression    Discoid lupus erythematosus    Diverticulosis    colonoscopy 04/2014 moderat pan colonic   Drug-seeking behavior    Gastritis    EGD 05/2014   Gastroparesis 01/2014   GERD (gastroesophageal reflux disease)    Hemorrhoid    internal large   Hiatal hernia    History of Billroth II operation    Hypertension    Lung nodule    CT 02/2014 needs repeat 1 month   Lung nodule < 6cm on CT 04/25/2014   Lupus    Malingering    Migraine    Nausea and vomiting    chronic, recurrent   Pancreatitis    Schatzki's ring    patent per EGD 04/2014   Sickle cell trait    Stroke [facility])    Suicide attempt (HCC)    Thyroid  disease 2000   overactive, radiation   Past Surgical History:  Procedure Laterality Date   ABDOMINAL HYSTERECTOMY  2013   [place]   ABDOMINAL HYSTERECTOMY     ABDOMINAL SURGERY     ADRENALECTOMY Right    AGILE CAPSULE N/A 01/05/2015   Procedure: AGILE CAPSULE;  Surgeon: Lamar CHRISTELLA Hollingshead, MD;  [facility];  Service: Endoscopy;  Laterality: N/A;  0700   BALLOON DILATION N/A 10/01/2021   Procedure: BALLOON DILATION;  Surgeon: Cindie Carlin POUR, DO;  [facility];  Service: Endoscopy;  Laterality: N/A;   Billroth II procedure      [place], first 2000, 2005/2006.   bilroth 2     BIOPSY  05/20/2013   Procedure: BIOPSIES OF ASCENDING AND SIGMOID COLON;  Surgeon:  Lamar CHRISTELLA Hollingshead, MD;  Location: [facility];  Service: Endoscopy;;   BIOPSY  04/26/2014   Procedure: BIOPSIES;  Surgeon: Margo LITTIE Haddock, MD;  Location: [facility];  Service: Endoscopy;;   BIOPSY  12/24/2018   Procedure: BIOPSY;  Surgeon: Hollingshead Lamar CHRISTELLA, MD;  [facility];  Service: Endoscopy;;  colon   CARPAL TUNNEL RELEASE Left 01/09/2024   Procedure: RELEASE, CARPAL TUNNEL, ENDOSCOPIC;  Surgeon: Arlinda Buster, MD;  Location: [facility];  Service: Orthopedics;  Laterality: Left;   CHOLECYSTECTOMY     COLONOSCOPY     in [place]   COLONOSCOPY N/A 12/16/2023   Procedure: COLONOSCOPY;  Surgeon: Cindie Carlin POUR, DO;  [facility];  Service: Endoscopy;  Laterality: N/A;  145pm, ok rm 1   COLONOSCOPY WITH PROPOFOL  N/A 05/20/2013   Dr.Rourk- inadequate prep, normal appearing rectum, grossly normal colon aside from pancolonic diverticula, normal terminal ileum bx= unremarkable colonic mucosa. Due for early interval 2016.    COLONOSCOPY WITH PROPOFOL  N/A 04/26/2014   DOQ:wnmfjo ileum/one colon polyp removed/moderate pan-colonic diverticulosis/large internal hemorrhoids   COLONOSCOPY WITH PROPOFOL  N/A 12/23/2014  Dr.Rourk- minimal internal hemorrhoids, pancolonic diverticulosis   COLONOSCOPY WITH PROPOFOL  N/A 08/23/2016   Procedure: COLONOSCOPY WITH PROPOFOL ;  Surgeon: Harvey Margo CROME, MD;  [facility];  Service: Endoscopy;  Laterality: N/A;   COLONOSCOPY WITH PROPOFOL  N/A 12/24/2018   Pancolonic diverticulosis, normal TI, one 5 mm polyp in rectum (tubular adenoma). Somewhat friable hemorrhagic mucosa in area of IC valve/cecum, possibly related to scope trauma s/p biopsy. Repeat in 7 years.   DEBRIDEMENT OF ABDOMINAL WALL ABSCESS N/A 02/08/2013   Procedure: DEBRIDEMENT OF ABDOMINAL WALL ABSCESS;  Surgeon: Oneil DELENA Budge, MD;  Location: [facility];  Service: General;  Laterality: N/A;   ESOPHAGEAL DILATION N/A 12/16/2023   Procedure: DILATION, ESOPHAGUS;   Surgeon: Cindie Carlin POUR, DO;  [facility];  Service: Endoscopy;  Laterality: N/A;   ESOPHAGOGASTRODUODENOSCOPY N/A 12/16/2023   Procedure: EGD (ESOPHAGOGASTRODUODENOSCOPY);  Surgeon: Cindie Carlin POUR, DO;  [facility];  Service: Endoscopy;  Laterality: N/A;   ESOPHAGOGASTRODUODENOSCOPY (EGD) WITH PROPOFOL  N/A 05/20/2013   Dr.Rourk- s/p prior gastric surgery with normal esophagus, residual gastric mucosa and patent efferent limb   ESOPHAGOGASTRODUODENOSCOPY (EGD) WITH PROPOFOL  N/A 02/03/2014   Dr. Shaaron:  s/p hemigastrectomy with retained gastric contents. Residual gastric mucosa and efferent limb appeared normal otherwise. Query gastroparesis.    ESOPHAGOGASTRODUODENOSCOPY (EGD) WITH PROPOFOL  N/A 04/26/2014   DOQ:dryjusxp'd ring/small HH/mild non-erosive gasrtitis/normal anastomosis   ESOPHAGOGASTRODUODENOSCOPY (EGD) WITH PROPOFOL  N/A 12/23/2014   Dr.Rourk- s/p prior hemigastrctomy, active oozing from anastomotic suture site, hemostasis achieved   ESOPHAGOGASTRODUODENOSCOPY (EGD) WITH PROPOFOL  N/A 05/23/2016   Dr. 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    ESOPHAGOGASTRODUODENOSCOPY (EGD) WITH PROPOFOL  N/A 05/11/2017   Procedure: ESOPHAGOGASTRODUODENOSCOPY (EGD) WITH PROPOFOL ;  Surgeon: Shaaron Lamar HERO, MD;  [facility];  Service: Endoscopy;  Laterality: N/A;   ESOPHAGOGASTRODUODENOSCOPY (EGD) WITH PROPOFOL  N/A 06/07/2021   Procedure: ESOPHAGOGASTRODUODENOSCOPY (EGD) WITH PROPOFOL ;  Surgeon: Shaaron Lamar HERO, MD;  [facility];  Service: Endoscopy;  Laterality: N/A;  11:45am   ESOPHAGOGASTRODUODENOSCOPY (EGD) WITH PROPOFOL  N/A 10/01/2021   Procedure: ESOPHAGOGASTRODUODENOSCOPY (EGD) WITH PROPOFOL ;  Surgeon: Cindie Carlin POUR, DO;  [facility];  Service: Endoscopy;  Laterality: N/A;  3:00pm   EXCISION OF MESH  08/2019   FLEXIBLE SIGMOIDOSCOPY N/A  12/17/2023   Procedure: KINGSTON SIDE;  Surgeon: Shaaron Lamar HERO, MD;  [facility];  Service: Endoscopy;  Laterality: N/A;   HEMORRHOID SURGERY N/A 06/18/2017   Procedure: THREE COLUMN EXTENSIVE HEMORRHOIDECTOMY;  Surgeon: Kallie Manuelita BROCKS, MD;  Location: [facility];  Service: General;  Laterality: N/A;   HERNIA REPAIR     INCISION AND DRAINAGE ABSCESS N/A 01/06/2017   Procedure: INCISION AND DRAINAGE ABDOMINAL WALL ABSCESS;  Surgeon: Budge Oneil, MD;  Location: [facility];  Service: General;  Laterality: N/A;   incisional hernial repair  09/05/2021   [place]   LEFT HEART CATH AND CORONARY ANGIOGRAPHY N/A 05/09/2020   Procedure: LEFT HEART CATH AND CORONARY ANGIOGRAPHY;  Surgeon: Elmira Newman PARAS, MD;  [facility];  Service: Cardiovascular;  Laterality: N/A;   LEFT HEART CATH AND CORONARY ANGIOGRAPHY N/A 08/22/2023   Procedure: LEFT HEART CATH AND CORONARY ANGIOGRAPHY;  Surgeon: Verlin Lonni BIRCH, MD;  Location: [facility];  Service: Cardiovascular;  Laterality: N/A;   POLYPECTOMY  12/24/2018   Procedure: POLYPECTOMY;  Surgeon: Shaaron Lamar HERO, MD;  [facility];  Service:  Endoscopy;;  colon   tendon repar Right    wrist   WOUND EXPLORATION Right 06/24/2013   Procedure: exploration of traumatic wound right wrist;  Surgeon: Franky JONELLE Curia, MD;  Location: [facility];  Service: Orthopedics;  Laterality: Right;   Patient Active Problem List   Diagnosis Date Noted   Stroke-like episode 01/20/2024   Carpal tunnel syndrome of left wrist 01/09/2024   Acute lower GI bleeding 12/16/2023   B12 deficiency 11/26/2023   Iron deficiency 11/26/2023   Chest pain 08/21/2023   Dyslipidemia 08/21/2023   Rectal pressure 08/05/2023   Abdominal bloating 08/05/2023   Anemia 08/05/2023   High risk medication use 05/05/2023   Unilateral primary osteoarthritis, left knee 05/05/2023   Seropositive rheumatoid arthritis (HCC) 05/02/2022   Seronegative  inflammatory arthritis 03/06/2022   Cellulitis 10/06/2021   Paroxysmal atrial fibrillation (HCC) 10/06/2021   Abdominal fluid collection 10/06/2021   Constipation 09/14/2021   Ventral hernia 05/15/2021   Dysphagia 05/15/2021   Cigarette nicotine  dependence without complication 09/12/2020   Continuous dependence on cigarette smoking 08/22/2020   Mixed hyperlipidemia 04/27/2020   Recurrent incisional hernia 02/03/2020   Bilateral knee pain 12/29/2019   Coronary artery disease of native artery of native heart with stable angina pectoris 06/25/2019   Acute respiratory failure with hypoxia (HCC) 05/20/2019   Essential hypertension 05/20/2019   COVID-19 05/20/2019   Syncope and collapse 05/20/2019   Metabolic acidosis 05/16/2019   Intractable vomiting with nausea 02/23/2019   Cocaine abuse (HCC)    Colitis 11/11/2018   Hemorrhoidal skin tag    Folate deficiency 05/11/2017   Abdominal wall fistula    Cellulitis of abdominal wall 01/02/2017   Acute left hemiparesis (HCC) 12/05/2016   Malingering 12/05/2016   Weakness of left lower extremity 12/02/2016   CVA (cerebral vascular accident) (HCC) 11/23/2016   Bright red rectal bleeding 08/22/2016   Hypotension due to blood loss    Acute GI bleeding 05/24/2016   Dieulafoy lesion of duodenum    History of Billroth II operation    Gastrointestinal hemorrhage 05/22/2016   Absolute anemia    Acute pancreatitis 10/01/2015   Alcohol abuse with intoxication    Left-sided weakness    Psychosomatic factor in physical condition    Upper GI bleed    Diverticulosis of colon with hemorrhage    Chronic wound infection of abdomen 12/22/2014   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   HTN (hypertension) 12/22/2014   Ataxia 11/01/2014   Hemorrhoids 04/26/2014   Lung nodule 04/26/2014   Diverticulosis    Gastritis    Hiatal hernia    Schatzki's ring    Acute blood loss anemia 04/25/2014   Hypokalemia 04/25/2014   Hematemesis with nausea 04/25/2014   Lung  nodule < 6cm on CT 04/25/2014   Intractable nausea and vomiting 04/24/2014   Gastroparesis    Abdominal pain 04/01/2014   Gastroenteritis 12/10/2013   Chronic abdominal wound infection 08/16/2013   MDD (major depressive disorder), recurrent episode, severe (HCC) 06/27/2013   Wrist laceration 06/24/2013   Diarrhea 05/07/2013   Rectal bleeding 05/07/2013   Adrenal mass, left 05/07/2013   Post-operative wound abscess 04/19/2013   Abdominal wall abscess 04/18/2013   Essential hypertension, benign 03/30/2013   History of cocaine abuse (HCC) 03/16/2013   History of schizoaffective disorder 03/16/2013   Bipolar disorder (HCC) 03/16/2013   Personality disorder (HCC) 03/16/2013   Tobacco use disorder 03/16/2013   Alcohol abuse 03/16/2013   Palpitations 03/16/2013   Poor vision 03/16/2013   History  of gastric bypass 03/16/2013   Status post hysterectomy with oophorectomy 03/16/2013   Hypothyroid 03/16/2013   SLE (systemic lupus erythematosus) (HCC) 03/16/2013    ONSET DATE: 01/18/24  REFERRING DIAG:  I63.9 (ICD-10-CM) - Stroke, acute, thrombotic (HCC)  R53.1 (ICD-10-CM) - Weakness    THERAPY DIAG:  No diagnosis found.  Rationale for Evaluation and Treatment: Rehabilitation  SUBJECTIVE:                                                                                                                                                                                             SUBJECTIVE STATEMENT: Patient arrives to clinic alone, used medicaid transportation. Immediately stating I do not feel good today. Reports feeling weak over the past few days- denies any sick contacts. VS WNL. Experienced a stroke 11/2 and notes residual L hemiparesis and difficulty with speech. She is LHD.   ***  Pt accompanied by: self  PERTINENT HISTORY: alcohol abuse, anemia, anxiety, CAD, depression, lupus, diverticulosis, GERD, HTN, migraine, SS trait, CVA, suicide attempt  PAIN:  Are you having pain?  No; intermittent dual HA  There were no vitals filed for this visit.   PRECAUTIONS: Fall   WEIGHT BEARING RESTRICTIONS: No  FALLS: Has patient fallen in last 6 months? No  LIVING ENVIRONMENT: Lives with: lives alone care aid 7 days/ week for 2 hours Lives in: House/apartment Stairs: No Has following equipment at home: None  PLOF: Needs assistance with homemaking  PATIENT GOALS: to get my strength back and get back socializing and cooking  OBJECTIVE:  Note: Objective measures were completed at Evaluation unless otherwise noted.  DIAGNOSTIC FINDINGS: 01/18/24 CT angio IMPRESSION: 1. Mild atherosclerosis without a large vessel occlusion or significant proximal stenosis in the head or neck. 2. These results were communicated to Dr. FREDRIK Hock at 5:51 PM on 01/18/2024 by secure text page via the [facility] messaging system.  01/19/24 head CT IMPRESSION: 1. Stable head CT. No acute intracranial abnormality.  COGNITION: Overall cognitive status: Within functional limits for tasks assessed   SENSATION: WFL  COORDINATION: Dysmetric L LE   POSTURE: No Significant postural limitations   LOWER EXTREMITY MMT:    MMT Right Eval Left Eval  Hip flexion 5 3  Hip extension    Hip abduction 5 4  Hip adduction 5 3+  Hip internal rotation    Hip external rotation    Knee flexion 5 3+  Knee extension 5 3-  Ankle dorsiflexion 5 3+  Ankle plantarflexion    Ankle inversion    Ankle eversion    (Blank rows = not tested)  BED MOBILITY:  independent  GAIT: Findings: Gait Characteristics: step through pattern, decreased arm swing- Left, decreased stance time- Left, Left foot flat, knee flexed in stance- Left, trunk flexed, narrow BOS, and poor foot clearance- Left, Distance walked: clinic, Assistive device utilized:None, and Level of assistance: SBA and CGA                                                                                                                               TREATMENT   Self-Care/Home Management  TherAct  NMR ***  PATIENT EDUCATION: Education details: PT POC, exam findings, see above*** Person educated: Patient Education method: Explanation Education comprehension: verbalized understanding and needs further education  HOME EXERCISE PROGRAM: To be provided  GOALS: Goals reviewed with patient? Yes  SHORT TERM GOALS: Target date: 03/05/24***  Pt will be independent with initial HEP for improved functional strength and balance  Baseline: to be provided Goal status: INITIAL  2.  Pt will improve 5x STS to </= 25 sec to demo improved functional LE strength and balance   Baseline: 29.22s B UE + CGA Goal status: INITIAL  3.  TUG goal  Baseline: to be assessed Goal status: INITIAL  4.  FGA goal  Baseline: to be assessed  Goal status: INITIAL   LONG TERM GOALS: Target date: 04/02/24   Pt will be independent with final HEP for improved functional strength and balance  Baseline: to be provided Goal status: INITIAL   Pt will improve 5x STS to </= 18 sec to demo improved functional LE strength and balance   Baseline: 29.22s B UE + CGA Goal status: INITIAL  TUG goal  Baseline: to be assessed Goal status: INITIAL  FGA goal  Baseline: to be assessed  Goal status: INITIAL  ASSESSMENT:  CLINICAL IMPRESSION: Emphasis of skilled PT session*** Continue POC.   OBJECTIVE IMPAIRMENTS: decreased activity tolerance, decreased balance, decreased coordination, decreased endurance, decreased knowledge of condition, decreased knowledge of use of DME, decreased mobility, difficulty walking, decreased strength, and impaired UE functional use.   ACTIVITY LIMITATIONS: carrying, lifting, stairs, bed mobility, hygiene/grooming, locomotion level, and caring for others  PARTICIPATION LIMITATIONS: meal prep, cleaning, interpersonal relationship, driving, shopping, community activity, and occupation  PERSONAL FACTORS: Age, Behavior  pattern, Fitness, Past/current experiences, Sex, Social background, Time since onset of injury/illness/exacerbation, Transportation, and 3+ comorbidities: see above are also affecting patient's functional outcome.   REHAB POTENTIAL: Fair co-morbidities  CLINICAL DECISION MAKING: Stable/uncomplicated  EVALUATION COMPLEXITY: Low  PLAN:  PT FREQUENCY: 1x/week per patient request  PT DURATION: 8 weeks  PLANNED INTERVENTIONS: [id number]- PT Re-evaluation, 97750- Physical Performance Testing, 97110-Therapeutic exercises, 97530- Therapeutic activity, [id number]- Neuromuscular re-education, 97535- Self Care, [id number]- Manual therapy, [id number]- Gait training, [phone]- Orthotic Initial, [phone]- Orthotic/Prosthetic subsequent, [phone]- Canalith repositioning, [id number]- Aquatic Therapy, [phone]- Electrical stimulation (manual), [phone] (1-2 muscles), [id number] (3+ muscles)- Dry Needling, Patient/Family education, Balance training, Stair training, Vestibular training, Visual/preceptual remediation/compensation, Cognitive remediation, and DME instructions  PLAN FOR NEXT SESSION: TUG + FGA  and goal, HEP for L NMR/functional strength, normal imaging + psych- may have some level of functional component? Given endurance, maybe try rollator? ***   Waddell Southgate, PT Waddell Southgate, PT, DPT, CSRS   02/19/2024, 7:35 AM

## 2024-02-20 NOTE — Telephone Encounter (Signed)
 Received Fax from [facility]  stating  They have received Pt Referral  and Pt is scheduled  to be seen 03/03/2024 @9am  w/Amy Roetta

## 2024-02-24 ENCOUNTER — Encounter: Payer: Self-pay | Admitting: Occupational Therapy

## 2024-02-24 NOTE — Therapy (Signed)
 [facility] [address] [place], [place], [address] Phone: [phone]   Fax:  [phone]  Patient Details  Name: Angel Price MRN: [id number] Date of Birth: 04-01-1965 Referring Provider:  Dr. Gildardo "Erwin" Agarwala   Encounter Date: 02/24/2024  OCCUPATIONAL THERAPY DISCHARGE SUMMARY  Visits from Start of Care: 2 - Eval + visit #2   Current functional level related to goals / functional outcomes: Pt has NOT met all goals to satisfactory levels but is scheduled for surgery tomorrow.  Remaining deficits: Pt has ongoing functional deficits that will likely benefit from resuming outpt OT services once medically appropriate.   Education / Equipment: Pt has lawyer and education for ROM and strength - see pt instructions for details. Pt verbalized initial understanding. See tx notes for more details.   Patient requests discharge due to scheduled procedure at this time.   [name], OT 02/24/2024, 4:58 PM

## 2024-02-26 ENCOUNTER — Ambulatory Visit

## 2024-03-04 ENCOUNTER — Ambulatory Visit

## 2024-03-09 ENCOUNTER — Ambulatory Visit

## 2024-03-09 ENCOUNTER — Ambulatory Visit: Admitting: Physical Therapy

## 2024-03-16 ENCOUNTER — Ambulatory Visit

## 2024-03-16 ENCOUNTER — Ambulatory Visit: Admitting: Physical Therapy

## 2024-03-23 ENCOUNTER — Ambulatory Visit

## 2024-03-25 ENCOUNTER — Ambulatory Visit: Admitting: Physical Therapy

## 2024-03-25 ENCOUNTER — Ambulatory Visit: Admitting: Occupational Therapy

## 2024-03-30 ENCOUNTER — Ambulatory Visit

## 2024-04-01 ENCOUNTER — Ambulatory Visit

## 2024-04-01 ENCOUNTER — Ambulatory Visit: Admitting: Physical Therapy

## 2024-04-08 ENCOUNTER — Ambulatory Visit: Admitting: Physical Therapy

## 2024-04-08 ENCOUNTER — Ambulatory Visit

## 2024-04-09 ENCOUNTER — Encounter: Payer: Self-pay | Admitting: Gastroenterology

## 2024-04-09 ENCOUNTER — Ambulatory Visit: Admitting: Gastroenterology

## 2024-04-09 VITALS — BP 130/88 | HR 81 | Temp 98.7°F | Ht 63.0 in | Wt 169.8 lb

## 2024-04-09 DIAGNOSIS — E538 Deficiency of other specified B group vitamins: Secondary | ICD-10-CM | POA: Diagnosis not present

## 2024-04-09 DIAGNOSIS — Z903 Acquired absence of stomach [part of]: Secondary | ICD-10-CM

## 2024-04-09 DIAGNOSIS — K625 Hemorrhage of anus and rectum: Secondary | ICD-10-CM | POA: Diagnosis not present

## 2024-04-09 DIAGNOSIS — D509 Iron deficiency anemia, unspecified: Secondary | ICD-10-CM | POA: Diagnosis not present

## 2024-04-09 NOTE — Patient Instructions (Signed)
 Continue to monitor for recurrent bleeding. If you have additional bleeding, please reach out to us . Ask to speak to one of our nurses instead of waiting for an appointment. If a lot of bleeding, you feel worsening weakness, you should go to the ER.  We will update your labs. Once results are available, we will reach out with plan.   Continue your iron, B12, and pantoprazole .

## 2024-04-09 NOTE — Progress Notes (Signed)
 "    GI Office Note    Referring Provider: Carlette Benita Area, MD Primary Care Physician:  Carlette Benita Area, MD  Primary Gastroenterologist: Ozell Hollingshead, MD   Chief Complaint   Chief Complaint  Patient presents with   Follow-up    Having issues with intermittent rectal bleeding.    History of Present Illness   Angel Price is a 59 y.o. female presenting today for follow up. Last seen 01/06/2024 by Dr. Hollingshead for follow-up of hospitalization for post polypectomy bleed.  CTA was positive in the distal sigmoid colon, flex sig performed and bleeding polypectomy site was clipped.  Had chronic rectal pressure for years, at last visit she reported that this pain had resolved.  Dysphagia resolved after esophageal dilation.  Plans for repeat colonoscopy in 3 years based on pathology findings.  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  She has a history of B12 deficiency and iron deficiency without anemia.  Pernicious anemia workup negative.  Recommended B12 oral supplement as well as iron supplement.  Discussed the use of AI scribe software for clinical note transcription with the patient, who gave verbal consent to proceed.  History of Present Illness Angel Price is a 59 year old female with prior polypectomy and recurrent gastrointestinal bleeding who presents for evaluation of ongoing rectal bleeding.  Since November 2025 she has had recurrent rectal bleeding episodes lasting 3-4 days, often with large-volume bright red blood and clots. She was unable to be seen sooner in part related to her daughter's recent life-threatening aortic dissection, and she is  now able to prioritize her own care.  During episodes she has urgency and abdominal cramping but often passes only blood, with associated perianal pain, abdominal bloating, cramping, and tenderness. She denies black stools. Otherwise BMs regular, most days. She has had some lightheadedness, weakness which she contributes to anemia.   Between bleeding episodes her bowel movements are daily and appear normal. Last episode of bleeding one week ago, longest she has been since it started. She does not use NSAIDs, aspirin  powders, or other over-the-counter pain medications. She has abstained from alcohol for 14 months and has not used illicit drugs for 6-8 years.  She has also had episodes of coughing up blood clots during periods of significant stress and poor appetite, with intermittent vomiting that has now resolved. This occurred while daughter who suffered aortic dissection was in the hospital. She is not sure if the blood she coughed up was just from all of there crying and irritation. This has resolved.  She denies heartburn, upper respiratory congestion, dysuria, or vaginal bleeding after hysterectomy.  She notes some recurrent dysphagia, previously resolved after esophageal dilation.   Current medications include daily oral iron, vitamin B12, and a reflux medication.        Prior Data     Results    Labs from January 19, 2024: Hemoglobin 10.6, hematocrit 30.4, MCV 70.4, platelets 433, creatinine 0.48, A1c 5.4, total cholesterol 197, HDL 40, LDL 142, total protein 7.1, albumin 4.3, AST 27, ALT 24, alk phos 119, total bilirubin 0.5  Flex sigmoidoscopy December 17, 2023: - Post polypectomy bleed - Blood and clot in the left colon and rectum.  Large area of  clot adherent to polypectomy site proximal rectum. - Polypectomy site closed with 360 clips x 2  Colonoscopy December 16, 2023: -Nonbleeding internal hemorrhoids -Diverticulosis in the entire examined colon -four 4 to 7 mm polyps in  the ascending colon and cecum -five 4 to 8 mm polyps in the transverse colon -three 5 to 10 mm polyps in the rectum and sigmoid colon -Multiple tubular adenomas  EGD December 16, 2023: - Z-line regular, 39 cm from the incisors - Mild Schatzki ring status post dilation - Patent Billroth II gastrojejunostomy found, characterized by healthy-appearing mucosa - Normal examined jejunum - Biopsies taken from the mid third of the esophagus, unremarkable  Medications   Current Outpatient Medications  Medication Sig Dispense Refill   amLODipine  (NORVASC ) 10 MG tablet Take 10 mg by mouth daily.     aspirin  EC 81 MG tablet Take 1 tablet (81 mg total) by mouth daily with breakfast. 30 tablet 11   atenolol  (TENORMIN ) 50 MG tablet Take 1 tablet (50 mg total) by mouth daily. 30 tablet 11   Cyanocobalamin  (B-12) 1000 MCG SUBL Place 1,000 mcg under the tongue daily.     DULoxetine  (CYMBALTA ) 30 MG capsule Take 30 mg by mouth 2 (two) times daily.     ferrous sulfate  325 (65 FE) MG EC tablet Take 325 mg by mouth daily.     gabapentin  (NEURONTIN ) 100 MG capsule Take 100 mg by mouth 3 (three) times daily.     hydroxychloroquine  (PLAQUENIL ) 200 MG tablet Take 1.5 tablets (300 mg total) by mouth daily. 135 tablet 1   mirtazapine  (REMERON ) 15 MG tablet Take 15 mg by mouth at bedtime as needed (for sleep).     nitroGLYCERIN  (NITROSTAT ) 0.4 MG SL tablet Place 1 tablet (0.4 mg total) under the tongue every 5 (five) minutes as needed for chest pain. 30 tablet 3   pantoprazole  (PROTONIX ) 40 MG tablet Take 1 tablet (40 mg total) by mouth daily before breakfast. 30 tablet 5   HYDROcodone -acetaminophen  (NORCO/VICODIN) 5-325 MG tablet Take 1 tablet by mouth 2 (two) times daily.     No current facility-administered medications for this visit.    Allergies   Allergies as of 04/09/2024 - Review Complete 04/09/2024  Allergen Reaction Noted   Metoclopramide  Anaphylaxis and Other (See Comments) 08/08/2016   Codeine  Hives 05/19/2019     Past Medical History   Past Medical History:  Diagnosis Date   Abscess    soft tissue   Adrenal mass    Alcohol abuse    Anemia    Anxiety    Arrhythmia    Blood transfusion without reported diagnosis    Chronic abdominal pain    Chronic wound infection of abdomen    Colon polyp    colonoscopy 04/2014   Coronary artery disease    Mild non obstr CAD   Depression    Discoid lupus erythematosus    Diverticulosis    colonoscopy 04/2014 moderat pan colonic   Drug-seeking behavior    Gastritis    EGD 05/2014   Gastroparesis 01/2014   GERD (gastroesophageal reflux disease)    Hemorrhoid    internal large   Hiatal hernia    History of Billroth II operation    Hypertension    Lung nodule    CT 02/2014 needs repeat 1 month   Lung nodule < 6cm on CT 04/25/2014   Lupus    Malingering    Migraine    Nausea and vomiting    chronic,  recurrent   Pancreatitis    Schatzki's ring    patent per EGD 04/2014   Sickle cell trait    Stroke [facility])    Suicide attempt (HCC)    Thyroid  disease 2000   overactive, radiation    Past Surgical History   Past Surgical History:  Procedure Laterality Date   ABDOMINAL HYSTERECTOMY  2013   [place]   ABDOMINAL HYSTERECTOMY     ABDOMINAL SURGERY     ADRENALECTOMY Right    AGILE CAPSULE N/A 01/05/2015   Procedure: AGILE CAPSULE;  Surgeon: Lamar CHRISTELLA Hollingshead, MD;  [facility];  Service: Endoscopy;  Laterality: N/A;  0700   BALLOON DILATION N/A 10/01/2021   Procedure: BALLOON DILATION;  Surgeon: Cindie Carlin POUR, DO;  [facility];  Service: Endoscopy;  Laterality: N/A;   Billroth II procedure      [place], first 2000, 2005/2006.   bilroth 2     BIOPSY  05/20/2013   Procedure: BIOPSIES OF ASCENDING AND SIGMOID COLON;  Surgeon: Lamar CHRISTELLA Hollingshead, MD;  Location: [facility];  Service: Endoscopy;;   BIOPSY  04/26/2014   Procedure: BIOPSIES;  Surgeon: Margo LITTIE Haddock, MD;  [facility]: [facility];  Service: Endoscopy;;    BIOPSY  12/24/2018   Procedure: BIOPSY;  Surgeon: Hollingshead Lamar CHRISTELLA, MD;  [facility];  Service: Endoscopy;;  colon   CARPAL TUNNEL RELEASE Left 01/09/2024   Procedure: RELEASE, CARPAL TUNNEL, ENDOSCOPIC;  Surgeon: Arlinda Buster, MD;  Location: [facility];  Service: Orthopedics;  Laterality: Left;   CHOLECYSTECTOMY     COLONOSCOPY     in [place]   COLONOSCOPY N/A 12/16/2023   Procedure: COLONOSCOPY;  Surgeon: Cindie Carlin POUR, DO;  [facility];  Service: Endoscopy;  Laterality: N/A;  145pm, ok rm 1   COLONOSCOPY WITH PROPOFOL  N/A 05/20/2013   Dr.Rourk- inadequate prep, normal appearing rectum, grossly normal colon aside from pancolonic diverticula, normal terminal ileum bx= unremarkable colonic mucosa. Due for early interval 2016.    COLONOSCOPY WITH PROPOFOL  N/A 04/26/2014   DOQ:wnmfjo ileum/one colon polyp removed/moderate pan-colonic diverticulosis/large internal hemorrhoids   COLONOSCOPY WITH PROPOFOL  N/A 12/23/2014   Dr.Rourk- minimal internal hemorrhoids, pancolonic diverticulosis   COLONOSCOPY WITH PROPOFOL  N/A 08/23/2016   Procedure: COLONOSCOPY WITH PROPOFOL ;  Surgeon: Haddock Margo LITTIE, MD;  [facility];  Service: Endoscopy;  Laterality: N/A;   COLONOSCOPY WITH PROPOFOL  N/A 12/24/2018   Pancolonic diverticulosis, normal TI, one 5 mm polyp in rectum (tubular adenoma). Somewhat friable hemorrhagic mucosa in area of IC valve/cecum, possibly related to scope trauma s/p biopsy. Repeat in 7 years.   DEBRIDEMENT OF ABDOMINAL WALL ABSCESS N/A 02/08/2013   Procedure: DEBRIDEMENT OF ABDOMINAL WALL ABSCESS;  Surgeon: Oneil DELENA Budge, MD;  [facility];  Service: General;  Laterality: N/A;   ESOPHAGEAL DILATION N/A 12/16/2023   Procedure: DILATION, ESOPHAGUS;  Surgeon: Cindie Carlin POUR, DO;  [facility];  Service: Endoscopy;  Laterality: N/A;   ESOPHAGOGASTRODUODENOSCOPY N/A 12/16/2023   Procedure: EGD  (ESOPHAGOGASTRODUODENOSCOPY);  Surgeon: Cindie Carlin POUR, DO;  [facility];  Service: Endoscopy;  Laterality: N/A;   ESOPHAGOGASTRODUODENOSCOPY (EGD) WITH PROPOFOL  N/A 05/20/2013   Dr.Rourk- s/p prior gastric surgery with normal esophagus, residual gastric mucosa and patent efferent limb   ESOPHAGOGASTRODUODENOSCOPY (EGD) WITH PROPOFOL  N/A 02/03/2014   Dr. Hollingshead:  s/p hemigastrectomy with retained gastric contents. Residual gastric mucosa and efferent limb appeared normal otherwise. Query gastroparesis.    ESOPHAGOGASTRODUODENOSCOPY (EGD)  WITH PROPOFOL  N/A 04/26/2014   DOQ:dryjusxp'd ring/small HH/mild non-erosive gasrtitis/normal anastomosis   ESOPHAGOGASTRODUODENOSCOPY (EGD) WITH PROPOFOL  N/A 12/23/2014   Dr.Rourk- s/p prior hemigastrctomy, active oozing from anastomotic suture site, hemostasis achieved   ESOPHAGOGASTRODUODENOSCOPY (EGD) WITH PROPOFOL  N/A 05/23/2016   Dr. 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    ESOPHAGOGASTRODUODENOSCOPY (EGD) WITH PROPOFOL  N/A 05/11/2017   Procedure: ESOPHAGOGASTRODUODENOSCOPY (EGD) WITH PROPOFOL ;  Surgeon: Shaaron Lamar HERO, MD;  [facility];  Service: Endoscopy;  Laterality: N/A;   ESOPHAGOGASTRODUODENOSCOPY (EGD) WITH PROPOFOL  N/A 06/07/2021   Procedure: ESOPHAGOGASTRODUODENOSCOPY (EGD) WITH PROPOFOL ;  Surgeon: Shaaron Lamar HERO, MD;  [facility];  Service: Endoscopy;  Laterality: N/A;  11:45am   ESOPHAGOGASTRODUODENOSCOPY (EGD) WITH PROPOFOL  N/A 10/01/2021   Procedure: ESOPHAGOGASTRODUODENOSCOPY (EGD) WITH PROPOFOL ;  Surgeon: Cindie Carlin POUR, DO;  [facility];  Service: Endoscopy;  Laterality: N/A;  3:00pm   EXCISION OF MESH  08/2019   FLEXIBLE SIGMOIDOSCOPY N/A 12/17/2023   Procedure: KINGSTON SIDE;  Surgeon: Shaaron Lamar HERO, MD;  [facility];  Service: Endoscopy;  Laterality: N/A;   HEMORRHOID  SURGERY N/A 06/18/2017   Procedure: THREE COLUMN EXTENSIVE HEMORRHOIDECTOMY;  Surgeon: Kallie Manuelita BROCKS, MD;  [facility]: [facility];  Service: General;  Laterality: N/A;   HERNIA REPAIR     INCISION AND DRAINAGE ABSCESS N/A 01/06/2017   Procedure: INCISION AND DRAINAGE ABDOMINAL WALL ABSCESS;  Surgeon: Mavis Anes, MD;  [facility]: [facility];  Service: General;  Laterality: N/A;   incisional hernial repair  09/05/2021   [place]   LEFT HEART CATH AND CORONARY ANGIOGRAPHY N/A 05/09/2020   Procedure: LEFT HEART CATH AND CORONARY ANGIOGRAPHY;  Surgeon: Elmira Newman PARAS, MD;  [facility];  Service: Cardiovascular;  Laterality: N/A;   LEFT HEART CATH AND CORONARY ANGIOGRAPHY N/A 08/22/2023   Procedure: LEFT HEART CATH AND CORONARY ANGIOGRAPHY;  Surgeon: Verlin Lonni BIRCH, MD;  [facility]: [facility];  Service: Cardiovascular;  Laterality: N/A;   POLYPECTOMY  12/24/2018   Procedure: POLYPECTOMY;  Surgeon: Shaaron Lamar HERO, MD;  [facility];  Service: Endoscopy;;  colon   tendon repar Right    wrist   WOUND EXPLORATION Right 06/24/2013   Procedure: exploration of traumatic wound right wrist;  Surgeon: Franky JONELLE Curia, MD;  [facility];  Service: Orthopedics;  Laterality: Right;    Past Family History   Family History  Problem Relation Age of Onset   Drug abuse Mother    Anemia Mother    Arrhythmia Mother    Clotting disorder Mother    Heart disease Mother    Heart failure Mother    Hypertension Mother    Lung cancer Father    Cancer Father        mets   Drug abuse Sister    Drug abuse Brother    Breast cancer Maternal Aunt    Bipolar disorder Maternal Aunt    Drug abuse Maternal Aunt    Colon cancer Maternal Grandmother        late 96s, early 42s   Bipolar disorder Paternal Grandfather    Brain cancer Son    Schizophrenia Son    Cancer Son        brain   Bipolar disorder Cousin    Liver disease Neg Hx     Past Social History   Social  History   Socioeconomic History  Marital status: Divorced    Spouse name: Not on file   Number of children: 4   Years of education: 10   Highest education level: Not on file  Occupational History   Not on file  Tobacco Use   Smoking status: Every Day    Current packs/day: 0.25    Average packs/day: 0.3 packs/day for 35.0 years (8.8 ttl pk-yrs)    Types: Cigarettes    Passive exposure: Past   Smokeless tobacco: Never  Vaping Use   Vaping status: Never Used  Substance and Sexual Activity   Alcohol use: Not Currently    Comment: quit in 01/2023, previously heavy   Drug use: Not Currently    Types: Cocaine    Comment: no cocaine in4  years per patient (08/20/21)   Sexual activity: Not Currently    Birth control/protection: Surgical  Other Topics Concern   Not on file  Social History Narrative   Caffeine - tea occass   Social Drivers of Health   Tobacco Use: High Risk (04/09/2024)   Patient History    Smoking Tobacco Use: Every Day    Smokeless Tobacco Use: Never    Passive Exposure: Past  Financial Resource Strain: Low Risk (09/11/2023)   Received from [facility]   Overall Financial Resource Strain (CARDIA)    Difficulty of Paying Living Expenses: Not hard at all  Food Insecurity: No Food Insecurity (12/17/2023)   Epic    Worried About Programme Researcher, Broadcasting/film/video in the Last Year: Never true    Ran Out of Food in the Last Year: Never true  Transportation Needs: No Transportation Needs (12/17/2023)   Epic    Lack of Transportation (Medical): No    Lack of Transportation (Non-Medical): No  Physical Activity: Unknown (09/11/2023)   [facility]   Exercise Vital Sign    On average, how many days per week do you engage in moderate to strenuous exercise (like a brisk walk)?: 0 days    Minutes of Exercise per Session: Not on file  Stress: No Stress Concern Present (09/11/2023)   Received from [facility] - Occupational  Stress Questionnaire    Feeling of Stress : Only a little  Social Connections: Socially Integrated (09/11/2023)   Received from [facility]   Social Network    How would you rate your social network (family, work, friends)?: Good participation with social networks  Intimate Partner Violence: Not At Risk (12/17/2023)   Epic    Fear of Current or Ex-Partner: No    Emotionally Abused: No    Physically Abused: No    Sexually Abused: No  Depression (PHQ2-9): Not on file  Alcohol Screen: Not on file  Housing: Low Risk (12/17/2023)   Epic    Unable to Pay for Housing in the Last Year: No    Number of Times Moved in the Last Year: 0    Homeless in the Last Year: No  Utilities: Not At Risk (12/17/2023)   Epic    Threatened with loss of utilities: No  Health Literacy: Not on file    Review of Systems   General: Negative for anorexia, weight loss, fever, chills, fatigue, +weakness. ENT: Negative for hoarseness, difficulty swallowing , nasal congestion. CV: Negative for chest pain, angina, palpitations, dyspnea on exertion, peripheral edema.  Respiratory: Negative for dyspnea at rest, dyspnea on exertion, cough, sputum, wheezing.  GI: See history of present illness. GU:  Negative for dysuria, hematuria,  urinary incontinence, urinary frequency, nocturnal urination.  Endo: Negative for unusual weight change.     Physical Exam   BP 130/88   Pulse 81   Temp 98.7 F (37.1 C) (Oral)   Ht 5' 3 (1.6 m)   Wt 169 lb 12.8 oz (77 kg)   LMP  (LMP Unknown)   SpO2 96%   BMI 30.08 kg/m    General: Well-nourished, well-developed in no acute distress.  Eyes: No icterus. Mouth: Oropharyngeal mucosa moist and pink   Lungs: Clear to auscultation bilaterally.  Heart: Regular rate and rhythm, no murmurs rubs or gallops.  Abdomen: Bowel sounds are normal, nontender, nondistended, no hepatosplenomegaly or masses,  no abdominal bruits or hernia, no rebound or guarding.  Rectal: not  performed Extremities: No lower extremity edema. No clubbing or deformities. Neuro: Alert and oriented x 4   Skin: Warm and dry, no jaundice.   Psych: Alert and cooperative, normal mood and affect.  Labs   See above  Imaging Studies   No results found.  Assessment/Plan:    Assessment & Plan Hematochezia Recurrent rectal bleeding with small clots. Present off/on since November. None in past week. Associated with crampy abdominal pain resolved with BM. She has complex GI history with Billroth II configuration for complicated PUD, small bowel Dieulafoy lesion. Differential includes diverticular bleeding, possible small bowel source, hemorrhoids less likely, UGI bleeding less likely. Describes symptomatic anemia.  - CBC - Advised to contact office directly if bleeding recurs. - once lab returns we will determine next step. If capsule endoscopy becomes necessary, we will need to determine if patency of small bowel is a concern. She mentioned history of small bowel obstruction before but I am not seeing in her medical record at this time  Iron deficiency  - Ordered iron studies with current blood work.  Vitamin B12 deficiency Chronic vitamin B12 deficiency post-Billroth II hemigastrectomy, managed with oral supplementation. Pernicious anemia work up negative.  - Ordered vitamin B12 level with current blood work.     Sonny RAMAN. Ezzard, MHS, PA-C [facility]  "

## 2024-04-10 LAB — CBC WITH DIFFERENTIAL/PLATELET
Basophils Absolute: 0 10*3/uL (ref 0.0–0.2)
Basos: 1 %
EOS (ABSOLUTE): 0.2 10*3/uL (ref 0.0–0.4)
Eos: 2 %
Hematocrit: 40.7 % (ref 34.0–46.6)
Hemoglobin: 12.5 g/dL (ref 11.1–15.9)
Immature Grans (Abs): 0 10*3/uL (ref 0.0–0.1)
Immature Granulocytes: 0 %
Lymphocytes Absolute: 3.5 10*3/uL — ABNORMAL HIGH (ref 0.7–3.1)
Lymphs: 45 %
MCH: 23.4 pg — ABNORMAL LOW (ref 26.6–33.0)
MCHC: 30.7 g/dL — ABNORMAL LOW (ref 31.5–35.7)
MCV: 76 fL — ABNORMAL LOW (ref 79–97)
Monocytes Absolute: 0.6 10*3/uL (ref 0.1–0.9)
Monocytes: 8 %
Neutrophils Absolute: 3.3 10*3/uL (ref 1.4–7.0)
Neutrophils: 44 %
Platelets: 386 10*3/uL (ref 150–450)
RBC: 5.35 x10E6/uL — ABNORMAL HIGH (ref 3.77–5.28)
RDW: 21.2 % — ABNORMAL HIGH (ref 11.7–15.4)
WBC: 7.7 10*3/uL (ref 3.4–10.8)

## 2024-04-10 LAB — IRON,TIBC AND FERRITIN PANEL
Ferritin: 12 ng/mL — ABNORMAL LOW (ref 15–150)
Iron Saturation: 7 % — CL (ref 15–55)
Iron: 32 ug/dL (ref 27–159)
Total Iron Binding Capacity: 444 ug/dL (ref 250–450)
UIBC: 412 ug/dL (ref 131–425)

## 2024-04-10 LAB — VITAMIN B12: Vitamin B-12: 218 pg/mL — ABNORMAL LOW (ref 232–1245)

## 2024-04-12 ENCOUNTER — Ambulatory Visit: Payer: Self-pay | Admitting: Gastroenterology

## 2024-04-15 ENCOUNTER — Ambulatory Visit

## 2024-04-15 ENCOUNTER — Ambulatory Visit: Admitting: Physical Therapy

## 2024-08-02 ENCOUNTER — Ambulatory Visit: Admitting: Internal Medicine
